# Patient Record
Sex: Male | Born: 1950 | Race: White | Hispanic: No | Marital: Single | Smoking: Former smoker
Health system: Southern US, Community
[De-identification: ages and names within clinical notes are randomized; demographics above are authoritative.]

## PROBLEM LIST (undated history)

## (undated) DIAGNOSIS — E785 Hyperlipidemia, unspecified: Secondary | ICD-10-CM

## (undated) DIAGNOSIS — D490 Neoplasm of unspecified behavior of digestive system: Secondary | ICD-10-CM

## (undated) DIAGNOSIS — I8500 Esophageal varices without bleeding: Secondary | ICD-10-CM

## (undated) DIAGNOSIS — K7460 Unspecified cirrhosis of liver: Secondary | ICD-10-CM

## (undated) DIAGNOSIS — K3189 Other diseases of stomach and duodenum: Secondary | ICD-10-CM

## (undated) DIAGNOSIS — K766 Portal hypertension: Secondary | ICD-10-CM

## (undated) DIAGNOSIS — E1121 Type 2 diabetes mellitus with diabetic nephropathy: Secondary | ICD-10-CM

## (undated) DIAGNOSIS — E119 Type 2 diabetes mellitus without complications: Secondary | ICD-10-CM

## (undated) DIAGNOSIS — K31811 Angiodysplasia of stomach and duodenum with bleeding: Secondary | ICD-10-CM

## (undated) DIAGNOSIS — D649 Anemia, unspecified: Secondary | ICD-10-CM

## (undated) DIAGNOSIS — I499 Cardiac arrhythmia, unspecified: Secondary | ICD-10-CM

## (undated) DIAGNOSIS — Z8719 Personal history of other diseases of the digestive system: Secondary | ICD-10-CM

## (undated) DIAGNOSIS — K7682 Hepatic encephalopathy: Secondary | ICD-10-CM

## (undated) DIAGNOSIS — K2270 Barrett's esophagus without dysplasia: Secondary | ICD-10-CM

## (undated) DIAGNOSIS — F32A Depression, unspecified: Secondary | ICD-10-CM

## (undated) DIAGNOSIS — K7290 Hepatic failure, unspecified without coma: Secondary | ICD-10-CM

## (undated) DIAGNOSIS — F329 Major depressive disorder, single episode, unspecified: Secondary | ICD-10-CM

## (undated) DIAGNOSIS — I4891 Unspecified atrial fibrillation: Secondary | ICD-10-CM

## (undated) DIAGNOSIS — N189 Chronic kidney disease, unspecified: Secondary | ICD-10-CM

## (undated) DIAGNOSIS — G4730 Sleep apnea, unspecified: Secondary | ICD-10-CM

## (undated) DIAGNOSIS — K219 Gastro-esophageal reflux disease without esophagitis: Secondary | ICD-10-CM

## (undated) DIAGNOSIS — M1990 Unspecified osteoarthritis, unspecified site: Secondary | ICD-10-CM

## (undated) DIAGNOSIS — I509 Heart failure, unspecified: Secondary | ICD-10-CM

## (undated) DIAGNOSIS — I872 Venous insufficiency (chronic) (peripheral): Secondary | ICD-10-CM

## (undated) DIAGNOSIS — D61818 Other pancytopenia: Secondary | ICD-10-CM

## (undated) DIAGNOSIS — I10 Essential (primary) hypertension: Secondary | ICD-10-CM

## (undated) DIAGNOSIS — J449 Chronic obstructive pulmonary disease, unspecified: Secondary | ICD-10-CM

## (undated) DIAGNOSIS — I878 Other specified disorders of veins: Secondary | ICD-10-CM

## (undated) DIAGNOSIS — F419 Anxiety disorder, unspecified: Secondary | ICD-10-CM

## (undated) DIAGNOSIS — K317 Polyp of stomach and duodenum: Secondary | ICD-10-CM

## (undated) DIAGNOSIS — K31819 Angiodysplasia of stomach and duodenum without bleeding: Secondary | ICD-10-CM

## (undated) DIAGNOSIS — R609 Edema, unspecified: Secondary | ICD-10-CM

## (undated) DIAGNOSIS — E669 Obesity, unspecified: Secondary | ICD-10-CM

## (undated) HISTORY — PX: UVULOPALATOPHARYNGOPLASTY: SHX827

## (undated) HISTORY — PX: TONSILLECTOMY AND ADENOIDECTOMY: SHX28

## (undated) HISTORY — DX: Heart failure, unspecified: I50.9

## (undated) HISTORY — PX: TONSILLECTOMY: SUR1361

## (undated) HISTORY — PX: ULNAR NERVE TRANSPOSITION: SHX2595

---
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
## 2004-10-13 ENCOUNTER — Ambulatory Visit: Payer: Self-pay | Admitting: Gastroenterology

## 2004-10-25 ENCOUNTER — Ambulatory Visit: Payer: Self-pay | Admitting: Gastroenterology

## 2006-02-21 ENCOUNTER — Ambulatory Visit: Payer: Self-pay | Admitting: Internal Medicine

## 2006-03-01 ENCOUNTER — Ambulatory Visit: Payer: Self-pay | Admitting: Internal Medicine

## 2006-03-31 ENCOUNTER — Ambulatory Visit: Payer: Self-pay | Admitting: Internal Medicine

## 2009-02-08 ENCOUNTER — Ambulatory Visit: Payer: Self-pay | Admitting: Internal Medicine

## 2009-02-22 ENCOUNTER — Ambulatory Visit: Payer: Self-pay | Admitting: Urology

## 2010-05-29 ENCOUNTER — Inpatient Hospital Stay: Payer: Self-pay | Admitting: Family Medicine

## 2011-11-06 ENCOUNTER — Ambulatory Visit: Payer: Self-pay | Admitting: Unknown Physician Specialty

## 2013-09-27 ENCOUNTER — Emergency Department: Payer: Self-pay | Admitting: Emergency Medicine

## 2013-09-27 LAB — CBC
HCT: 25.7 % — ABNORMAL LOW (ref 40.0–52.0)
HGB: 8.2 g/dL — ABNORMAL LOW (ref 13.0–18.0)
MCHC: 31.9 g/dL — ABNORMAL LOW (ref 32.0–36.0)
MCV: 79 fL — ABNORMAL LOW (ref 80–100)
Platelet: 73 10*3/uL — ABNORMAL LOW (ref 150–440)
RBC: 3.26 10*6/uL — ABNORMAL LOW (ref 4.40–5.90)
RDW: 16 % — ABNORMAL HIGH (ref 11.5–14.5)
WBC: 5.2 10*3/uL (ref 3.8–10.6)

## 2013-09-27 LAB — BASIC METABOLIC PANEL
Anion Gap: 5 — ABNORMAL LOW (ref 7–16)
Calcium, Total: 8.5 mg/dL (ref 8.5–10.1)
Chloride: 108 mmol/L — ABNORMAL HIGH (ref 98–107)
Creatinine: 1.79 mg/dL — ABNORMAL HIGH (ref 0.60–1.30)
EGFR (African American): 46 — ABNORMAL LOW
EGFR (Non-African Amer.): 40 — ABNORMAL LOW
Glucose: 133 mg/dL — ABNORMAL HIGH (ref 65–99)
Osmolality: 281 (ref 275–301)
Sodium: 139 mmol/L (ref 136–145)

## 2013-11-04 DIAGNOSIS — R161 Splenomegaly, not elsewhere classified: Secondary | ICD-10-CM | POA: Insufficient documentation

## 2013-11-05 ENCOUNTER — Observation Stay: Payer: Self-pay | Admitting: Internal Medicine

## 2013-11-05 LAB — BASIC METABOLIC PANEL
Anion Gap: 6 — ABNORMAL LOW (ref 7–16)
BUN: 21 mg/dL — ABNORMAL HIGH (ref 7–18)
CHLORIDE: 106 mmol/L (ref 98–107)
Calcium, Total: 9.4 mg/dL (ref 8.5–10.1)
Co2: 27 mmol/L (ref 21–32)
Creatinine: 2.04 mg/dL — ABNORMAL HIGH (ref 0.60–1.30)
GFR CALC AF AMER: 40 — AB
GFR CALC NON AF AMER: 34 — AB
Glucose: 143 mg/dL — ABNORMAL HIGH (ref 65–99)
Osmolality: 283 (ref 275–301)
Potassium: 3.9 mmol/L (ref 3.5–5.1)
Sodium: 139 mmol/L (ref 136–145)

## 2013-11-05 LAB — URINALYSIS, COMPLETE
BILIRUBIN, UR: NEGATIVE
Blood: NEGATIVE
Ketone: NEGATIVE
NITRITE: NEGATIVE
Ph: 6 (ref 4.5–8.0)
RBC,UR: 1 /HPF (ref 0–5)
Specific Gravity: 1.013 (ref 1.003–1.030)
Squamous Epithelial: 2
WBC UR: 9 /HPF (ref 0–5)

## 2013-11-05 LAB — CBC WITH DIFFERENTIAL/PLATELET
Basophil #: 0.1 10*3/uL (ref 0.0–0.1)
Basophil %: 1.1 %
Eosinophil #: 0.2 10*3/uL (ref 0.0–0.7)
Eosinophil %: 3.8 %
HCT: 26.7 % — AB (ref 40.0–52.0)
HGB: 8.5 g/dL — ABNORMAL LOW (ref 13.0–18.0)
LYMPHS PCT: 18.2 %
Lymphocyte #: 0.9 10*3/uL — ABNORMAL LOW (ref 1.0–3.6)
MCH: 24.6 pg — ABNORMAL LOW (ref 26.0–34.0)
MCHC: 31.9 g/dL — ABNORMAL LOW (ref 32.0–36.0)
MCV: 77 fL — ABNORMAL LOW (ref 80–100)
MONOS PCT: 9.4 %
Monocyte #: 0.5 x10 3/mm (ref 0.2–1.0)
NEUTROS PCT: 67.5 %
Neutrophil #: 3.4 10*3/uL (ref 1.4–6.5)
PLATELETS: 86 10*3/uL — AB (ref 150–440)
RBC: 3.46 10*6/uL — ABNORMAL LOW (ref 4.40–5.90)
RDW: 15.5 % — AB (ref 11.5–14.5)
WBC: 5.1 10*3/uL (ref 3.8–10.6)

## 2013-11-05 LAB — TROPONIN I: Troponin-I: <0.02 ng/mL

## 2013-11-06 LAB — CBC WITH DIFFERENTIAL/PLATELET
BASOS ABS: 0 10*3/uL (ref 0.0–0.1)
Basophil %: 0.8 %
EOS PCT: 4 %
Eosinophil #: 0.2 10*3/uL (ref 0.0–0.7)
HCT: 24 % — ABNORMAL LOW (ref 40.0–52.0)
HGB: 8 g/dL — ABNORMAL LOW (ref 13.0–18.0)
LYMPHS PCT: 22.3 %
Lymphocyte #: 1.1 10*3/uL (ref 1.0–3.6)
MCH: 25.8 pg — AB (ref 26.0–34.0)
MCHC: 33.2 g/dL (ref 32.0–36.0)
MCV: 78 fL — ABNORMAL LOW (ref 80–100)
MONOS PCT: 10.2 %
Monocyte #: 0.5 x10 3/mm (ref 0.2–1.0)
Neutrophil #: 3.2 10*3/uL (ref 1.4–6.5)
Neutrophil %: 62.7 %
Platelet: 83 10*3/uL — ABNORMAL LOW (ref 150–440)
RBC: 3.08 10*6/uL — ABNORMAL LOW (ref 4.40–5.90)
RDW: 16.1 % — AB (ref 11.5–14.5)
WBC: 5 10*3/uL (ref 3.8–10.6)

## 2013-11-06 LAB — BASIC METABOLIC PANEL
Anion Gap: 2 — ABNORMAL LOW (ref 7–16)
BUN: 21 mg/dL — AB (ref 7–18)
Calcium, Total: 9.1 mg/dL (ref 8.5–10.1)
Chloride: 109 mmol/L — ABNORMAL HIGH (ref 98–107)
Co2: 28 mmol/L (ref 21–32)
Creatinine: 1.89 mg/dL — ABNORMAL HIGH (ref 0.60–1.30)
EGFR (Non-African Amer.): 37 — ABNORMAL LOW
GFR CALC AF AMER: 43 — AB
Glucose: 78 mg/dL (ref 65–99)
Osmolality: 279 (ref 275–301)
Potassium: 3.5 mmol/L (ref 3.5–5.1)
SODIUM: 139 mmol/L (ref 136–145)

## 2013-11-06 LAB — HEPATIC FUNCTION PANEL A (ARMC)
ALK PHOS: 66 U/L
Albumin: 2.9 g/dL — ABNORMAL LOW (ref 3.4–5.0)
Bilirubin, Direct: 0.2 mg/dL (ref 0.00–0.20)
Bilirubin,Total: 0.6 mg/dL (ref 0.2–1.0)
SGOT(AST): 22 U/L (ref 15–37)
SGPT (ALT): 15 U/L (ref 12–78)
Total Protein: 6.8 g/dL (ref 6.4–8.2)

## 2013-11-06 LAB — AMMONIA: Ammonia, Plasma: 75 mcmol/L — ABNORMAL HIGH (ref 11–32)

## 2013-11-06 LAB — PROTIME-INR
INR: 1.3
Prothrombin Time: 15.7 secs — ABNORMAL HIGH (ref 11.5–14.7)

## 2013-11-07 LAB — CBC WITH DIFFERENTIAL/PLATELET
BASOS PCT: 1 %
Basophil #: 0 10*3/uL (ref 0.0–0.1)
EOS ABS: 0.2 10*3/uL (ref 0.0–0.7)
EOS PCT: 4.4 %
HCT: 24 % — ABNORMAL LOW (ref 40.0–52.0)
HGB: 7.8 g/dL — ABNORMAL LOW (ref 13.0–18.0)
LYMPHS PCT: 27.3 %
Lymphocyte #: 1.2 10*3/uL (ref 1.0–3.6)
MCH: 25.7 pg — ABNORMAL LOW (ref 26.0–34.0)
MCHC: 32.7 g/dL (ref 32.0–36.0)
MCV: 78 fL — AB (ref 80–100)
Monocyte #: 0.4 x10 3/mm (ref 0.2–1.0)
Monocyte %: 8.7 %
NEUTROS ABS: 2.6 10*3/uL (ref 1.4–6.5)
Neutrophil %: 58.6 %
Platelet: 77 10*3/uL — ABNORMAL LOW (ref 150–440)
RBC: 3.05 10*6/uL — AB (ref 4.40–5.90)
RDW: 16.4 % — ABNORMAL HIGH (ref 11.5–14.5)
WBC: 4.5 10*3/uL (ref 3.8–10.6)

## 2013-11-07 LAB — COMPREHENSIVE METABOLIC PANEL
ALBUMIN: 2.8 g/dL — AB (ref 3.4–5.0)
ALK PHOS: 62 U/L
AST: 19 U/L (ref 15–37)
Anion Gap: 5 — ABNORMAL LOW (ref 7–16)
BILIRUBIN TOTAL: 0.7 mg/dL (ref 0.2–1.0)
BUN: 20 mg/dL — ABNORMAL HIGH (ref 7–18)
CREATININE: 1.84 mg/dL — AB (ref 0.60–1.30)
Calcium, Total: 9.3 mg/dL (ref 8.5–10.1)
Chloride: 111 mmol/L — ABNORMAL HIGH (ref 98–107)
Co2: 24 mmol/L (ref 21–32)
GFR CALC AF AMER: 45 — AB
GFR CALC NON AF AMER: 39 — AB
Glucose: 121 mg/dL — ABNORMAL HIGH (ref 65–99)
Osmolality: 283 (ref 275–301)
POTASSIUM: 4.1 mmol/L (ref 3.5–5.1)
SGPT (ALT): 14 U/L (ref 12–78)
Sodium: 140 mmol/L (ref 136–145)
Total Protein: 6.5 g/dL (ref 6.4–8.2)

## 2013-11-07 LAB — AMMONIA: Ammonia, Plasma: 64 mcmol/L — ABNORMAL HIGH (ref 11–32)

## 2013-12-04 DIAGNOSIS — D696 Thrombocytopenia, unspecified: Secondary | ICD-10-CM | POA: Insufficient documentation

## 2013-12-04 DIAGNOSIS — D509 Iron deficiency anemia, unspecified: Secondary | ICD-10-CM | POA: Insufficient documentation

## 2014-01-06 ENCOUNTER — Encounter: Payer: Self-pay | Admitting: Surgery

## 2014-01-11 ENCOUNTER — Encounter: Payer: Self-pay | Admitting: Surgery

## 2014-01-12 ENCOUNTER — Ambulatory Visit: Payer: Self-pay | Admitting: Internal Medicine

## 2014-01-12 LAB — CBC CANCER CENTER
BASOS ABS: 2 %
Bands: 1 %
Eosinophil: 4 %
HCT: 25.6 % — ABNORMAL LOW (ref 40.0–52.0)
HGB: 7.7 g/dL — ABNORMAL LOW (ref 13.0–18.0)
Lymphocytes: 22 %
MCH: 23.3 pg — ABNORMAL LOW (ref 26.0–34.0)
MCHC: 30 g/dL — ABNORMAL LOW (ref 32.0–36.0)
MCV: 78 fL — AB (ref 80–100)
Monocytes: 4 %
Platelet: 97 x10 3/mm — ABNORMAL LOW (ref 150–440)
RBC: 3.3 10*6/uL — ABNORMAL LOW (ref 4.40–5.90)
RDW: 17.2 % — AB (ref 11.5–14.5)
Segmented Neutrophils: 67 %
WBC: 5.7 x10 3/mm (ref 3.8–10.6)

## 2014-01-12 LAB — IRON AND TIBC
Iron Bind.Cap.(Total): 431 ug/dL (ref 250–450)
Iron Saturation: 9 %
Iron: 37 ug/dL — ABNORMAL LOW (ref 65–175)
Unbound Iron-Bind.Cap.: 394 ug/dL

## 2014-01-12 LAB — FERRITIN: Ferritin (ARMC): 8 ng/mL (ref 8–388)

## 2014-01-12 LAB — RETICULOCYTES
ABSOLUTE RETIC COUNT: 0.0545 10*6/uL (ref 0.019–0.186)
RETICULOCYTE: 1.65 % (ref 0.4–3.1)

## 2014-01-12 LAB — LACTATE DEHYDROGENASE: LDH: 193 U/L (ref 85–241)

## 2014-01-12 LAB — FOLATE: Folic Acid: 13.7 ng/mL (ref 3.1–100.0)

## 2014-01-13 LAB — PROT IMMUNOELECTROPHORES(ARMC)

## 2014-01-22 LAB — OCCULT BLOOD X 1 CARD TO LAB, STOOL
OCCULT BLOOD, FECES: POSITIVE
Occult Blood, Feces: POSITIVE

## 2014-01-29 ENCOUNTER — Ambulatory Visit: Payer: Self-pay | Admitting: Internal Medicine

## 2014-01-29 ENCOUNTER — Encounter: Payer: Self-pay | Admitting: Surgery

## 2014-03-30 ENCOUNTER — Ambulatory Visit: Payer: Self-pay | Admitting: Internal Medicine

## 2014-03-30 LAB — CBC CANCER CENTER
BASOS ABS: 0 x10 3/mm (ref 0.0–0.1)
Basophil %: 0.8 %
EOS ABS: 0.3 x10 3/mm (ref 0.0–0.7)
Eosinophil %: 6.4 %
HCT: 34.5 % — ABNORMAL LOW (ref 40.0–52.0)
HGB: 11.9 g/dL — ABNORMAL LOW (ref 13.0–18.0)
LYMPHS ABS: 1.4 x10 3/mm (ref 1.0–3.6)
LYMPHS PCT: 26.5 %
MCH: 30 pg (ref 26.0–34.0)
MCHC: 34.4 g/dL (ref 32.0–36.0)
MCV: 87 fL (ref 80–100)
MONOS PCT: 6.1 %
Monocyte #: 0.3 x10 3/mm (ref 0.2–1.0)
Neutrophil #: 3.1 x10 3/mm (ref 1.4–6.5)
Neutrophil %: 60.2 %
Platelet: 75 x10 3/mm — ABNORMAL LOW (ref 150–440)
RBC: 3.96 10*6/uL — ABNORMAL LOW (ref 4.40–5.90)
RDW: 18.8 % — ABNORMAL HIGH (ref 11.5–14.5)
WBC: 5.2 x10 3/mm (ref 3.8–10.6)

## 2014-03-30 LAB — IRON AND TIBC
IRON SATURATION: 55 %
Iron Bind.Cap.(Total): 332 ug/dL (ref 250–450)
Iron: 182 ug/dL — ABNORMAL HIGH (ref 65–175)
Unbound Iron-Bind.Cap.: 150 ug/dL

## 2014-03-30 LAB — FERRITIN: Ferritin (ARMC): 66 ng/mL (ref 8–388)

## 2014-03-31 ENCOUNTER — Ambulatory Visit: Payer: Self-pay | Admitting: Internal Medicine

## 2014-05-09 ENCOUNTER — Inpatient Hospital Stay: Payer: Self-pay | Admitting: Internal Medicine

## 2014-05-09 LAB — URINALYSIS, COMPLETE
Bilirubin,UR: NEGATIVE
Blood: NEGATIVE
Glucose,UR: >=500 mg/dL (ref 0–75)
Ketone: NEGATIVE
Leukocyte Esterase: NEGATIVE
NITRITE: NEGATIVE
Ph: 5 (ref 4.5–8.0)
Protein: NEGATIVE
SPECIFIC GRAVITY: 1.017 (ref 1.003–1.030)
Squamous Epithelial: 3
WBC UR: 3 /HPF (ref 0–5)

## 2014-05-09 LAB — CBC
HCT: 35.8 % — ABNORMAL LOW (ref 40.0–52.0)
HGB: 12.4 g/dL — ABNORMAL LOW (ref 13.0–18.0)
MCH: 32 pg (ref 26.0–34.0)
MCHC: 34.7 g/dL (ref 32.0–36.0)
MCV: 92 fL (ref 80–100)
PLATELETS: 80 10*3/uL — AB (ref 150–440)
RBC: 3.88 10*6/uL — AB (ref 4.40–5.90)
RDW: 14.9 % — ABNORMAL HIGH (ref 11.5–14.5)
WBC: 4.2 10*3/uL (ref 3.8–10.6)

## 2014-05-09 LAB — COMPREHENSIVE METABOLIC PANEL
ANION GAP: 9 (ref 7–16)
Albumin: 3.1 g/dL — ABNORMAL LOW (ref 3.4–5.0)
Alkaline Phosphatase: 59 U/L
BILIRUBIN TOTAL: 1.7 mg/dL — AB (ref 0.2–1.0)
BUN: 20 mg/dL — AB (ref 7–18)
CREATININE: 2.6 mg/dL — AB (ref 0.60–1.30)
Calcium, Total: 9.2 mg/dL (ref 8.5–10.1)
Chloride: 103 mmol/L (ref 98–107)
Co2: 26 mmol/L (ref 21–32)
EGFR (African American): 29 — ABNORMAL LOW
EGFR (Non-African Amer.): 25 — ABNORMAL LOW
GLUCOSE: 259 mg/dL — AB (ref 65–99)
OSMOLALITY: 287 (ref 275–301)
Potassium: 3.6 mmol/L (ref 3.5–5.1)
SGOT(AST): 21 U/L (ref 15–37)
SGPT (ALT): 26 U/L
SODIUM: 138 mmol/L (ref 136–145)
TOTAL PROTEIN: 6.8 g/dL (ref 6.4–8.2)

## 2014-05-09 LAB — PRO B NATRIURETIC PEPTIDE: B-TYPE NATIURETIC PEPTID: 158 pg/mL — AB (ref 0–125)

## 2014-05-09 LAB — LIPASE, BLOOD: Lipase: 113 U/L (ref 73–393)

## 2014-05-09 LAB — PROTIME-INR
INR: 1.2
Prothrombin Time: 15.3 secs — ABNORMAL HIGH (ref 11.5–14.7)

## 2014-05-09 LAB — AMMONIA: AMMONIA, PLASMA: 66 umol/L — AB (ref 11–32)

## 2014-05-09 LAB — TROPONIN I: Troponin-I: <0.02 ng/mL

## 2014-05-10 LAB — BASIC METABOLIC PANEL
Anion Gap: 11 (ref 7–16)
BUN: 26 mg/dL — ABNORMAL HIGH (ref 7–18)
CALCIUM: 8.4 mg/dL — AB (ref 8.5–10.1)
CREATININE: 2.5 mg/dL — AB (ref 0.60–1.30)
Chloride: 103 mmol/L (ref 98–107)
Co2: 24 mmol/L (ref 21–32)
GFR CALC AF AMER: 31 — AB
GFR CALC NON AF AMER: 27 — AB
Glucose: 264 mg/dL — ABNORMAL HIGH (ref 65–99)
Osmolality: 290 (ref 275–301)
Potassium: 3.5 mmol/L (ref 3.5–5.1)
SODIUM: 138 mmol/L (ref 136–145)

## 2014-05-10 LAB — CBC WITH DIFFERENTIAL/PLATELET
BASOS ABS: 0 10*3/uL (ref 0.0–0.1)
Basophil %: 0.3 %
Eosinophil #: 0.1 10*3/uL (ref 0.0–0.7)
Eosinophil %: 0.6 %
HCT: 30.2 % — ABNORMAL LOW (ref 40.0–52.0)
HGB: 10.9 g/dL — ABNORMAL LOW (ref 13.0–18.0)
LYMPHS PCT: 10.9 %
Lymphocyte #: 0.9 10*3/uL — ABNORMAL LOW (ref 1.0–3.6)
MCH: 32.8 pg (ref 26.0–34.0)
MCHC: 36.2 g/dL — AB (ref 32.0–36.0)
MCV: 91 fL (ref 80–100)
Monocyte #: 0.5 x10 3/mm (ref 0.2–1.0)
Monocyte %: 5.9 %
Neutrophil #: 7 10*3/uL — ABNORMAL HIGH (ref 1.4–6.5)
Neutrophil %: 82.3 %
Platelet: 59 10*3/uL — ABNORMAL LOW (ref 150–440)
RBC: 3.33 10*6/uL — ABNORMAL LOW (ref 4.40–5.90)
RDW: 14.8 % — AB (ref 11.5–14.5)
WBC: 8.5 10*3/uL (ref 3.8–10.6)

## 2014-05-10 LAB — AMMONIA: AMMONIA, PLASMA: 89 umol/L — AB (ref 11–32)

## 2014-05-10 LAB — TSH: Thyroid Stimulating Horm: 0.44 u[IU]/mL — ABNORMAL LOW

## 2014-05-10 LAB — MAGNESIUM: Magnesium: 1.7 mg/dL — ABNORMAL LOW

## 2014-05-10 LAB — HEMOGLOBIN A1C: Hemoglobin A1C: 11 % — ABNORMAL HIGH (ref 4.2–6.3)

## 2014-05-11 LAB — CBC WITH DIFFERENTIAL/PLATELET
Basophil #: 0 10*3/uL (ref 0.0–0.1)
Basophil %: 0.3 %
EOS ABS: 0.2 10*3/uL (ref 0.0–0.7)
EOS PCT: 2.3 %
HCT: 29.5 % — AB (ref 40.0–52.0)
HGB: 10.2 g/dL — AB (ref 13.0–18.0)
Lymphocyte #: 1.1 10*3/uL (ref 1.0–3.6)
Lymphocyte %: 13.2 %
MCH: 32 pg (ref 26.0–34.0)
MCHC: 34.4 g/dL (ref 32.0–36.0)
MCV: 93 fL (ref 80–100)
MONOS PCT: 8 %
Monocyte #: 0.7 x10 3/mm (ref 0.2–1.0)
NEUTROS PCT: 76.2 %
Neutrophil #: 6.5 10*3/uL (ref 1.4–6.5)
PLATELETS: 66 10*3/uL — AB (ref 150–440)
RBC: 3.18 10*6/uL — AB (ref 4.40–5.90)
RDW: 14.8 % — AB (ref 11.5–14.5)
WBC: 8.5 10*3/uL (ref 3.8–10.6)

## 2014-05-11 LAB — COMPREHENSIVE METABOLIC PANEL
ALK PHOS: 43 U/L — AB
AST: 13 U/L — AB (ref 15–37)
Albumin: 2.2 g/dL — ABNORMAL LOW (ref 3.4–5.0)
Anion Gap: 8 (ref 7–16)
BILIRUBIN TOTAL: 1.2 mg/dL — AB (ref 0.2–1.0)
BUN: 28 mg/dL — ABNORMAL HIGH (ref 7–18)
Calcium, Total: 8.1 mg/dL — ABNORMAL LOW (ref 8.5–10.1)
Chloride: 106 mmol/L (ref 98–107)
Co2: 25 mmol/L (ref 21–32)
Creatinine: 2.26 mg/dL — ABNORMAL HIGH (ref 0.60–1.30)
EGFR (African American): 35 — ABNORMAL LOW
GFR CALC NON AF AMER: 30 — AB
Glucose: 205 mg/dL — ABNORMAL HIGH (ref 65–99)
Osmolality: 289 (ref 275–301)
POTASSIUM: 3.7 mmol/L (ref 3.5–5.1)
SGPT (ALT): 14 U/L
SODIUM: 139 mmol/L (ref 136–145)
TOTAL PROTEIN: 5.8 g/dL — AB (ref 6.4–8.2)

## 2014-05-11 LAB — AMMONIA: Ammonia, Plasma: 51 mcmol/L — ABNORMAL HIGH (ref 11–32)

## 2014-05-11 LAB — MAGNESIUM: Magnesium: 2.1 mg/dL

## 2014-05-12 LAB — COMPREHENSIVE METABOLIC PANEL
ALK PHOS: 46 U/L
ALT: 20 U/L
ANION GAP: 9 (ref 7–16)
AST: 29 U/L (ref 15–37)
Albumin: 2 g/dL — ABNORMAL LOW (ref 3.4–5.0)
BILIRUBIN TOTAL: 1.1 mg/dL — AB (ref 0.2–1.0)
BUN: 25 mg/dL — AB (ref 7–18)
CHLORIDE: 108 mmol/L — AB (ref 98–107)
Calcium, Total: 8.2 mg/dL — ABNORMAL LOW (ref 8.5–10.1)
Co2: 23 mmol/L (ref 21–32)
Creatinine: 1.98 mg/dL — ABNORMAL HIGH (ref 0.60–1.30)
EGFR (African American): 41 — ABNORMAL LOW
GFR CALC NON AF AMER: 35 — AB
Glucose: 172 mg/dL — ABNORMAL HIGH (ref 65–99)
Osmolality: 288 (ref 275–301)
Potassium: 3.7 mmol/L (ref 3.5–5.1)
Sodium: 140 mmol/L (ref 136–145)
Total Protein: 5.8 g/dL — ABNORMAL LOW (ref 6.4–8.2)

## 2014-05-12 LAB — CBC WITH DIFFERENTIAL/PLATELET
BASOS ABS: 0 10*3/uL (ref 0.0–0.1)
BASOS PCT: 0.7 %
EOS PCT: 3.8 %
Eosinophil #: 0.3 10*3/uL (ref 0.0–0.7)
HCT: 28.1 % — ABNORMAL LOW (ref 40.0–52.0)
HGB: 9.9 g/dL — ABNORMAL LOW (ref 13.0–18.0)
Lymphocyte #: 1.1 10*3/uL (ref 1.0–3.6)
Lymphocyte %: 14.2 %
MCH: 32.5 pg (ref 26.0–34.0)
MCHC: 35.1 g/dL (ref 32.0–36.0)
MCV: 93 fL (ref 80–100)
MONO ABS: 0.5 x10 3/mm (ref 0.2–1.0)
Monocyte %: 6.8 %
NEUTROS ABS: 5.5 10*3/uL (ref 1.4–6.5)
Neutrophil %: 74.5 %
Platelet: 77 10*3/uL — ABNORMAL LOW (ref 150–440)
RBC: 3.04 10*6/uL — ABNORMAL LOW (ref 4.40–5.90)
RDW: 14.4 % (ref 11.5–14.5)
WBC: 7.4 10*3/uL (ref 3.8–10.6)

## 2014-05-12 LAB — URINE CULTURE

## 2014-05-12 LAB — AMMONIA: Ammonia, Plasma: 60 mcmol/L — ABNORMAL HIGH (ref 11–32)

## 2014-05-13 LAB — CBC WITH DIFFERENTIAL/PLATELET
BASOS PCT: 0.8 %
Basophil #: 0 10*3/uL (ref 0.0–0.1)
Eosinophil #: 0.2 10*3/uL (ref 0.0–0.7)
Eosinophil %: 4.1 %
HCT: 29.1 % — ABNORMAL LOW (ref 40.0–52.0)
HGB: 10 g/dL — ABNORMAL LOW (ref 13.0–18.0)
Lymphocyte #: 1.2 10*3/uL (ref 1.0–3.6)
Lymphocyte %: 19.3 %
MCH: 31.7 pg (ref 26.0–34.0)
MCHC: 34.3 g/dL (ref 32.0–36.0)
MCV: 92 fL (ref 80–100)
MONOS PCT: 9.3 %
Monocyte #: 0.6 x10 3/mm (ref 0.2–1.0)
NEUTROS PCT: 66.5 %
Neutrophil #: 4 10*3/uL (ref 1.4–6.5)
Platelet: 97 10*3/uL — ABNORMAL LOW (ref 150–440)
RBC: 3.15 10*6/uL — ABNORMAL LOW (ref 4.40–5.90)
RDW: 14.3 % (ref 11.5–14.5)
WBC: 6.1 10*3/uL (ref 3.8–10.6)

## 2014-05-13 LAB — COMPREHENSIVE METABOLIC PANEL WITH GFR
Albumin: 2 g/dL — ABNORMAL LOW
Alkaline Phosphatase: 49 U/L
Anion Gap: 9
BUN: 23 mg/dL — ABNORMAL HIGH
Bilirubin,Total: 0.8 mg/dL
Calcium, Total: 8.3 mg/dL — ABNORMAL LOW
Chloride: 107 mmol/L
Co2: 24 mmol/L
Creatinine: 1.98 mg/dL — ABNORMAL HIGH
EGFR (African American): 41 — ABNORMAL LOW
EGFR (Non-African Amer.): 35 — ABNORMAL LOW
Glucose: 135 mg/dL — ABNORMAL HIGH
Osmolality: 285
Potassium: 3.6 mmol/L
SGOT(AST): 37 U/L
SGPT (ALT): 26 U/L
Sodium: 140 mmol/L
Total Protein: 5.8 g/dL — ABNORMAL LOW

## 2014-05-13 LAB — AMMONIA: Ammonia, Plasma: 65 umol/L — ABNORMAL HIGH

## 2014-05-14 LAB — CBC WITH DIFFERENTIAL/PLATELET
BASOS ABS: 0.1 10*3/uL (ref 0.0–0.1)
Basophil %: 0.9 %
EOS ABS: 0.2 10*3/uL (ref 0.0–0.7)
Eosinophil %: 4 %
HCT: 29.2 % — ABNORMAL LOW (ref 40.0–52.0)
HGB: 10.1 g/dL — ABNORMAL LOW (ref 13.0–18.0)
LYMPHS ABS: 1.3 10*3/uL (ref 1.0–3.6)
Lymphocyte %: 20.8 %
MCH: 32.1 pg (ref 26.0–34.0)
MCHC: 34.6 g/dL (ref 32.0–36.0)
MCV: 93 fL (ref 80–100)
MONO ABS: 0.6 x10 3/mm (ref 0.2–1.0)
Monocyte %: 10.6 %
NEUTROS ABS: 3.8 10*3/uL (ref 1.4–6.5)
NEUTROS PCT: 63.7 %
PLATELETS: 95 10*3/uL — AB (ref 150–440)
RBC: 3.15 10*6/uL — AB (ref 4.40–5.90)
RDW: 14.3 % (ref 11.5–14.5)
WBC: 6 10*3/uL (ref 3.8–10.6)

## 2014-05-14 LAB — AMMONIA: Ammonia, Plasma: 70 mcmol/L — ABNORMAL HIGH (ref 11–32)

## 2014-05-14 LAB — COMPREHENSIVE METABOLIC PANEL
ALT: 28 U/L
ANION GAP: 5 — AB (ref 7–16)
Albumin: 2.1 g/dL — ABNORMAL LOW (ref 3.4–5.0)
Alkaline Phosphatase: 49 U/L
BILIRUBIN TOTAL: 0.7 mg/dL (ref 0.2–1.0)
BUN: 22 mg/dL — AB (ref 7–18)
CHLORIDE: 108 mmol/L — AB (ref 98–107)
CO2: 25 mmol/L (ref 21–32)
Calcium, Total: 8 mg/dL — ABNORMAL LOW (ref 8.5–10.1)
Creatinine: 1.87 mg/dL — ABNORMAL HIGH (ref 0.60–1.30)
EGFR (African American): 44 — ABNORMAL LOW
EGFR (Non-African Amer.): 38 — ABNORMAL LOW
GLUCOSE: 124 mg/dL — AB (ref 65–99)
Osmolality: 280 (ref 275–301)
Potassium: 3.6 mmol/L (ref 3.5–5.1)
SGOT(AST): 29 U/L (ref 15–37)
SODIUM: 138 mmol/L (ref 136–145)
Total Protein: 5.7 g/dL — ABNORMAL LOW (ref 6.4–8.2)

## 2014-05-14 LAB — CULTURE, BLOOD (SINGLE)

## 2014-05-25 DIAGNOSIS — K2270 Barrett's esophagus without dysplasia: Secondary | ICD-10-CM | POA: Insufficient documentation

## 2014-05-25 DIAGNOSIS — I10 Essential (primary) hypertension: Secondary | ICD-10-CM | POA: Insufficient documentation

## 2014-05-25 DIAGNOSIS — D61818 Other pancytopenia: Secondary | ICD-10-CM | POA: Insufficient documentation

## 2014-05-25 DIAGNOSIS — E669 Obesity, unspecified: Secondary | ICD-10-CM | POA: Insufficient documentation

## 2014-05-25 DIAGNOSIS — G4730 Sleep apnea, unspecified: Secondary | ICD-10-CM | POA: Insufficient documentation

## 2014-05-25 DIAGNOSIS — Z8719 Personal history of other diseases of the digestive system: Secondary | ICD-10-CM | POA: Insufficient documentation

## 2014-05-25 DIAGNOSIS — R609 Edema, unspecified: Secondary | ICD-10-CM | POA: Insufficient documentation

## 2014-05-25 DIAGNOSIS — E66813 Obesity, class 3: Secondary | ICD-10-CM | POA: Insufficient documentation

## 2014-06-22 ENCOUNTER — Emergency Department: Payer: Self-pay | Admitting: Emergency Medicine

## 2014-06-22 LAB — COMPREHENSIVE METABOLIC PANEL
ALBUMIN: 3 g/dL — AB (ref 3.4–5.0)
ANION GAP: 6 — AB (ref 7–16)
AST: 33 U/L (ref 15–37)
Alkaline Phosphatase: 68 U/L
BUN: 16 mg/dL (ref 7–18)
Bilirubin,Total: 0.7 mg/dL (ref 0.2–1.0)
CALCIUM: 8.7 mg/dL (ref 8.5–10.1)
CHLORIDE: 106 mmol/L (ref 98–107)
CO2: 27 mmol/L (ref 21–32)
Creatinine: 2 mg/dL — ABNORMAL HIGH (ref 0.60–1.30)
EGFR (African American): 40 — ABNORMAL LOW
EGFR (Non-African Amer.): 35 — ABNORMAL LOW
Glucose: 231 mg/dL — ABNORMAL HIGH (ref 65–99)
Osmolality: 286 (ref 275–301)
Potassium: 3.8 mmol/L (ref 3.5–5.1)
SGPT (ALT): 21 U/L
Sodium: 139 mmol/L (ref 136–145)
Total Protein: 6.8 g/dL (ref 6.4–8.2)

## 2014-06-22 LAB — CBC
HCT: 32.6 % — AB (ref 40.0–52.0)
HGB: 11.3 g/dL — AB (ref 13.0–18.0)
MCH: 32.4 pg (ref 26.0–34.0)
MCHC: 34.5 g/dL (ref 32.0–36.0)
MCV: 94 fL (ref 80–100)
Platelet: 94 10*3/uL — ABNORMAL LOW (ref 150–440)
RBC: 3.48 10*6/uL — ABNORMAL LOW (ref 4.40–5.90)
RDW: 14 % (ref 11.5–14.5)
WBC: 6.7 10*3/uL (ref 3.8–10.6)

## 2014-06-22 LAB — TROPONIN I: Troponin-I: <0.02 ng/mL

## 2014-06-22 LAB — AMMONIA: Ammonia, Plasma: 66 mcmol/L — ABNORMAL HIGH (ref 11–32)

## 2014-07-08 ENCOUNTER — Ambulatory Visit: Payer: Self-pay | Admitting: Internal Medicine

## 2014-07-08 LAB — CBC CANCER CENTER
Basophil #: 0.1 x10 3/mm (ref 0.0–0.1)
Basophil %: 1.3 %
EOS ABS: 0.2 x10 3/mm (ref 0.0–0.7)
Eosinophil %: 4.2 %
HCT: 29.5 % — AB (ref 40.0–52.0)
HGB: 10.1 g/dL — AB (ref 13.0–18.0)
LYMPHS ABS: 1.3 x10 3/mm (ref 1.0–3.6)
LYMPHS PCT: 22.7 %
MCH: 31.4 pg (ref 26.0–34.0)
MCHC: 34.1 g/dL (ref 32.0–36.0)
MCV: 92 fL (ref 80–100)
MONOS PCT: 6 %
Monocyte #: 0.4 x10 3/mm (ref 0.2–1.0)
Neutrophil #: 3.9 x10 3/mm (ref 1.4–6.5)
Neutrophil %: 65.8 %
Platelet: 136 x10 3/mm — ABNORMAL LOW (ref 150–440)
RBC: 3.2 10*6/uL — ABNORMAL LOW (ref 4.40–5.90)
RDW: 13.4 % (ref 11.5–14.5)
WBC: 5.9 x10 3/mm (ref 3.8–10.6)

## 2014-07-08 LAB — IRON AND TIBC
IRON SATURATION: 36 %
IRON: 117 ug/dL (ref 65–175)
Iron Bind.Cap.(Total): 324 ug/dL (ref 250–450)
Unbound Iron-Bind.Cap.: 207 ug/dL

## 2014-07-08 LAB — FERRITIN: Ferritin (ARMC): 67 ng/mL (ref 8–388)

## 2014-07-19 LAB — CREATININE, SERUM
Creatinine: 2.25 mg/dL — ABNORMAL HIGH (ref 0.60–1.30)
EGFR (Non-African Amer.): 32 — ABNORMAL LOW
GFR CALC AF AMER: 38 — AB

## 2014-07-20 LAB — KAPPA/LAMBDA FREE LIGHT CHAINS (ARMC)

## 2014-07-23 ENCOUNTER — Emergency Department: Payer: Self-pay | Admitting: Emergency Medicine

## 2014-07-23 LAB — COMPREHENSIVE METABOLIC PANEL
ALK PHOS: 65 U/L
Albumin: 3.3 g/dL — ABNORMAL LOW (ref 3.4–5.0)
Anion Gap: 8 (ref 7–16)
BILIRUBIN TOTAL: 0.8 mg/dL (ref 0.2–1.0)
BUN: 17 mg/dL (ref 7–18)
Calcium, Total: 9.3 mg/dL (ref 8.5–10.1)
Chloride: 104 mmol/L (ref 98–107)
Co2: 24 mmol/L (ref 21–32)
Creatinine: 2.16 mg/dL — ABNORMAL HIGH (ref 0.60–1.30)
Glucose: 263 mg/dL — ABNORMAL HIGH (ref 65–99)
Osmolality: 283 (ref 275–301)
POTASSIUM: 4.2 mmol/L (ref 3.5–5.1)
SGOT(AST): 32 U/L (ref 15–37)
SGPT (ALT): 34 U/L
Sodium: 136 mmol/L (ref 136–145)
TOTAL PROTEIN: 7.2 g/dL (ref 6.4–8.2)

## 2014-07-23 LAB — URINALYSIS, COMPLETE
BILIRUBIN, UR: NEGATIVE
Bacteria: NONE SEEN
Blood: NEGATIVE
Hyaline Cast: 2
Ketone: NEGATIVE
Leukocyte Esterase: NEGATIVE
Nitrite: NEGATIVE
PH: 5 (ref 4.5–8.0)
Protein: NEGATIVE
RBC,UR: <1 /HPF (ref 0–5)
Specific Gravity: 1.016 (ref 1.003–1.030)
Squamous Epithelial: 1

## 2014-07-23 LAB — AMMONIA: Ammonia, Plasma: 78 mcmol/L — ABNORMAL HIGH (ref 11–32)

## 2014-07-23 LAB — CBC
HCT: 32.1 % — ABNORMAL LOW (ref 40.0–52.0)
HGB: 11.2 g/dL — AB (ref 13.0–18.0)
MCH: 31.7 pg (ref 26.0–34.0)
MCHC: 34.8 g/dL (ref 32.0–36.0)
MCV: 91 fL (ref 80–100)
PLATELETS: 105 10*3/uL — AB (ref 150–440)
RBC: 3.52 10*6/uL — ABNORMAL LOW (ref 4.40–5.90)
RDW: 14 % (ref 11.5–14.5)
WBC: 6.3 10*3/uL (ref 3.8–10.6)

## 2014-07-23 LAB — ETHANOL: ETHANOL LVL: 4 mg/dL (ref 0–80)

## 2014-08-01 ENCOUNTER — Ambulatory Visit: Payer: Self-pay | Admitting: Internal Medicine

## 2014-08-13 ENCOUNTER — Inpatient Hospital Stay: Payer: Self-pay | Admitting: Internal Medicine

## 2014-08-13 LAB — COMPREHENSIVE METABOLIC PANEL
ALK PHOS: 59 U/L
Albumin: 3.1 g/dL — ABNORMAL LOW (ref 3.4–5.0)
Anion Gap: 9 (ref 7–16)
BUN: 21 mg/dL — AB (ref 7–18)
Bilirubin,Total: 1.3 mg/dL — ABNORMAL HIGH (ref 0.2–1.0)
CALCIUM: 9.4 mg/dL (ref 8.5–10.1)
CREATININE: 2.22 mg/dL — AB (ref 0.60–1.30)
Chloride: 107 mmol/L (ref 98–107)
Co2: 26 mmol/L (ref 21–32)
EGFR (Non-African Amer.): 32 — ABNORMAL LOW
GFR CALC AF AMER: 39 — AB
Glucose: 104 mg/dL — ABNORMAL HIGH (ref 65–99)
Osmolality: 286 (ref 275–301)
POTASSIUM: 3.9 mmol/L (ref 3.5–5.1)
SGOT(AST): 26 U/L (ref 15–37)
SGPT (ALT): 27 U/L
Sodium: 142 mmol/L (ref 136–145)
TOTAL PROTEIN: 7.1 g/dL (ref 6.4–8.2)

## 2014-08-13 LAB — CBC
HCT: 32.7 % — AB (ref 40.0–52.0)
HGB: 11.2 g/dL — ABNORMAL LOW (ref 13.0–18.0)
MCH: 31.9 pg (ref 26.0–34.0)
MCHC: 34.1 g/dL (ref 32.0–36.0)
MCV: 94 fL (ref 80–100)
PLATELETS: 77 10*3/uL — AB (ref 150–440)
RBC: 3.5 10*6/uL — AB (ref 4.40–5.90)
RDW: 14.7 % — ABNORMAL HIGH (ref 11.5–14.5)
WBC: 9.6 10*3/uL (ref 3.8–10.6)

## 2014-08-13 LAB — CBC WITH DIFFERENTIAL/PLATELET
BASOS PCT: 0.3 %
Basophil #: 0 10*3/uL (ref 0.0–0.1)
EOS ABS: 0 10*3/uL (ref 0.0–0.7)
Eosinophil %: 0.3 %
HCT: 30.5 % — ABNORMAL LOW (ref 40.0–52.0)
HGB: 10.5 g/dL — ABNORMAL LOW (ref 13.0–18.0)
LYMPHS ABS: 0.9 10*3/uL — AB (ref 1.0–3.6)
Lymphocyte %: 7.6 %
MCH: 31.6 pg (ref 26.0–34.0)
MCHC: 34.3 g/dL (ref 32.0–36.0)
MCV: 92 fL (ref 80–100)
MONO ABS: 0.5 x10 3/mm (ref 0.2–1.0)
MONOS PCT: 4.2 %
NEUTROS PCT: 87.6 %
Neutrophil #: 10.3 10*3/uL — ABNORMAL HIGH (ref 1.4–6.5)
Platelet: 64 10*3/uL — ABNORMAL LOW (ref 150–440)
RBC: 3.3 10*6/uL — ABNORMAL LOW (ref 4.40–5.90)
RDW: 14.4 % (ref 11.5–14.5)
WBC: 11.7 10*3/uL — AB (ref 3.8–10.6)

## 2014-08-13 LAB — PROTIME-INR
INR: 1.3
PROTHROMBIN TIME: 15.8 s — AB (ref 11.5–14.7)

## 2014-08-13 LAB — CK TOTAL AND CKMB (NOT AT ARMC)
CK, Total: 169 U/L
CK-MB: 2.3 ng/mL (ref 0.5–3.6)

## 2014-08-13 LAB — TROPONIN I: Troponin-I: <0.02 ng/mL

## 2014-08-13 LAB — AMMONIA: AMMONIA, PLASMA: 45 umol/L — AB (ref 11–32)

## 2014-08-14 LAB — CBC WITH DIFFERENTIAL/PLATELET
BASOS ABS: 0 10*3/uL (ref 0.0–0.1)
Basophil %: 0.4 %
EOS ABS: 0.2 10*3/uL (ref 0.0–0.7)
EOS PCT: 1.7 %
HCT: 28.6 % — ABNORMAL LOW (ref 40.0–52.0)
HGB: 9.9 g/dL — ABNORMAL LOW (ref 13.0–18.0)
LYMPHS ABS: 1 10*3/uL (ref 1.0–3.6)
Lymphocyte %: 9.9 %
MCH: 32.1 pg (ref 26.0–34.0)
MCHC: 34.6 g/dL (ref 32.0–36.0)
MCV: 93 fL (ref 80–100)
MONOS PCT: 5.9 %
Monocyte #: 0.6 x10 3/mm (ref 0.2–1.0)
NEUTROS PCT: 82.1 %
Neutrophil #: 8.1 10*3/uL — ABNORMAL HIGH (ref 1.4–6.5)
Platelet: 60 10*3/uL — ABNORMAL LOW (ref 150–440)
RBC: 3.09 10*6/uL — ABNORMAL LOW (ref 4.40–5.90)
RDW: 14.6 % — AB (ref 11.5–14.5)
WBC: 9.8 10*3/uL (ref 3.8–10.6)

## 2014-08-14 LAB — BASIC METABOLIC PANEL
ANION GAP: 8 (ref 7–16)
BUN: 26 mg/dL — AB (ref 7–18)
CALCIUM: 8.3 mg/dL — AB (ref 8.5–10.1)
CHLORIDE: 109 mmol/L — AB (ref 98–107)
Co2: 26 mmol/L (ref 21–32)
Creatinine: 2.32 mg/dL — ABNORMAL HIGH (ref 0.60–1.30)
EGFR (African American): 37 — ABNORMAL LOW
EGFR (Non-African Amer.): 30 — ABNORMAL LOW
Glucose: 161 mg/dL — ABNORMAL HIGH (ref 65–99)
Osmolality: 293 (ref 275–301)
POTASSIUM: 4.3 mmol/L (ref 3.5–5.1)
Sodium: 143 mmol/L (ref 136–145)

## 2014-08-14 LAB — AMMONIA: AMMONIA, PLASMA: 54 umol/L — AB (ref 11–32)

## 2014-08-15 LAB — CBC WITH DIFFERENTIAL/PLATELET
BASOS ABS: 0 10*3/uL (ref 0.0–0.1)
BASOS PCT: 0.4 %
EOS PCT: 4.4 %
Eosinophil #: 0.4 10*3/uL (ref 0.0–0.7)
HCT: 28.5 % — ABNORMAL LOW (ref 40.0–52.0)
HGB: 9.8 g/dL — AB (ref 13.0–18.0)
LYMPHS PCT: 16.4 %
Lymphocyte #: 1.4 10*3/uL (ref 1.0–3.6)
MCH: 31.9 pg (ref 26.0–34.0)
MCHC: 34.5 g/dL (ref 32.0–36.0)
MCV: 93 fL (ref 80–100)
MONOS PCT: 7.6 %
Monocyte #: 0.6 x10 3/mm (ref 0.2–1.0)
Neutrophil #: 6.1 10*3/uL (ref 1.4–6.5)
Neutrophil %: 71.2 %
Platelet: 71 10*3/uL — ABNORMAL LOW (ref 150–440)
RBC: 3.08 10*6/uL — ABNORMAL LOW (ref 4.40–5.90)
RDW: 14.8 % — ABNORMAL HIGH (ref 11.5–14.5)
WBC: 8.5 10*3/uL (ref 3.8–10.6)

## 2014-08-15 LAB — OCCULT BLOOD X 1 CARD TO LAB, STOOL: Occult Blood, Feces: POSITIVE

## 2014-08-16 LAB — CBC WITH DIFFERENTIAL/PLATELET
BASOS ABS: 0.1 10*3/uL (ref 0.0–0.1)
Basophil %: 0.7 %
EOS PCT: 5 %
Eosinophil #: 0.4 10*3/uL (ref 0.0–0.7)
HCT: 28.9 % — ABNORMAL LOW (ref 40.0–52.0)
HGB: 10.2 g/dL — AB (ref 13.0–18.0)
Lymphocyte #: 1.4 10*3/uL (ref 1.0–3.6)
Lymphocyte %: 18.9 %
MCH: 32.1 pg (ref 26.0–34.0)
MCHC: 35.1 g/dL (ref 32.0–36.0)
MCV: 91 fL (ref 80–100)
Monocyte #: 0.5 x10 3/mm (ref 0.2–1.0)
Monocyte %: 7 %
NEUTROS PCT: 68.4 %
Neutrophil #: 5.1 10*3/uL (ref 1.4–6.5)
Platelet: 80 10*3/uL — ABNORMAL LOW (ref 150–440)
RBC: 3.16 10*6/uL — ABNORMAL LOW (ref 4.40–5.90)
RDW: 14.3 % (ref 11.5–14.5)
WBC: 7.5 10*3/uL (ref 3.8–10.6)

## 2014-08-16 LAB — BASIC METABOLIC PANEL
ANION GAP: 7 (ref 7–16)
BUN: 20 mg/dL — AB (ref 7–18)
CREATININE: 1.82 mg/dL — AB (ref 0.60–1.30)
Calcium, Total: 8 mg/dL — ABNORMAL LOW (ref 8.5–10.1)
Chloride: 108 mmol/L — ABNORMAL HIGH (ref 98–107)
Co2: 25 mmol/L (ref 21–32)
EGFR (African American): 49 — ABNORMAL LOW
GFR CALC NON AF AMER: 40 — AB
Glucose: 114 mg/dL — ABNORMAL HIGH (ref 65–99)
OSMOLALITY: 283 (ref 275–301)
Potassium: 4 mmol/L (ref 3.5–5.1)
Sodium: 140 mmol/L (ref 136–145)

## 2014-08-17 LAB — CBC WITH DIFFERENTIAL/PLATELET
BASOS ABS: 0 10*3/uL (ref 0.0–0.1)
BASOS PCT: 0.8 %
Eosinophil #: 0.3 10*3/uL (ref 0.0–0.7)
Eosinophil %: 5.2 %
HCT: 30.2 % — ABNORMAL LOW (ref 40.0–52.0)
HGB: 10.4 g/dL — ABNORMAL LOW (ref 13.0–18.0)
LYMPHS ABS: 1.2 10*3/uL (ref 1.0–3.6)
LYMPHS PCT: 19.9 %
MCH: 31.7 pg (ref 26.0–34.0)
MCHC: 34.3 g/dL (ref 32.0–36.0)
MCV: 92 fL (ref 80–100)
MONO ABS: 0.5 x10 3/mm (ref 0.2–1.0)
Monocyte %: 8.1 %
Neutrophil #: 4.1 10*3/uL (ref 1.4–6.5)
Neutrophil %: 66 %
PLATELETS: 87 10*3/uL — AB (ref 150–440)
RBC: 3.27 10*6/uL — ABNORMAL LOW (ref 4.40–5.90)
RDW: 14.5 % (ref 11.5–14.5)
WBC: 6.2 10*3/uL (ref 3.8–10.6)

## 2014-09-10 ENCOUNTER — Inpatient Hospital Stay: Payer: Self-pay | Admitting: Internal Medicine

## 2014-09-10 LAB — CBC WITH DIFFERENTIAL/PLATELET
BASOS PCT: 0.5 %
Basophil #: 0.1 10*3/uL (ref 0.0–0.1)
Basophil #: 0 10*3/uL (ref 0.0–0.1)
Basophil %: 0.3 %
EOS ABS: 0.1 10*3/uL (ref 0.0–0.7)
EOS ABS: 0 10*3/uL (ref 0.0–0.7)
EOS PCT: 1.2 %
EOS PCT: 0.2 %
HCT: 29.9 % — ABNORMAL LOW (ref 40.0–52.0)
HCT: 29.9 % — ABNORMAL LOW (ref 40.0–52.0)
HGB: 10.1 g/dL — ABNORMAL LOW (ref 13.0–18.0)
HGB: 10.1 g/dL — ABNORMAL LOW (ref 13.0–18.0)
LYMPHS ABS: 0.7 10*3/uL — AB (ref 1.0–3.6)
LYMPHS PCT: 6.1 %
Lymphocyte #: 1 10*3/uL (ref 1.0–3.6)
Lymphocyte %: 7.2 %
MCH: 31.2 pg (ref 26.0–34.0)
MCH: 31.2 pg (ref 26.0–34.0)
MCHC: 33.7 g/dL (ref 32.0–36.0)
MCHC: 33.8 g/dL (ref 32.0–36.0)
MCV: 92 fL (ref 80–100)
MCV: 92 fL (ref 80–100)
Monocyte #: 0.5 x10 3/mm (ref 0.2–1.0)
Monocyte #: 0.6 x10 3/mm (ref 0.2–1.0)
Monocyte %: 4.1 %
Monocyte %: 4.1 %
Neutrophil #: 9.8 10*3/uL — ABNORMAL HIGH (ref 1.4–6.5)
Neutrophil #: 11.8 10*3/uL — ABNORMAL HIGH (ref 1.4–6.5)
Neutrophil %: 88.1 %
Neutrophil %: 88.2 %
Platelet: 102 10*3/uL — ABNORMAL LOW (ref 150–440)
Platelet: 87 10*3/uL — ABNORMAL LOW (ref 150–440)
RBC: 3.24 10*6/uL — ABNORMAL LOW (ref 4.40–5.90)
RBC: 3.25 10*6/uL — ABNORMAL LOW (ref 4.40–5.90)
RDW: 13.9 % (ref 11.5–14.5)
RDW: 14.2 % (ref 11.5–14.5)
WBC: 11.1 10*3/uL — ABNORMAL HIGH (ref 3.8–10.6)
WBC: 13.4 10*3/uL — ABNORMAL HIGH (ref 3.8–10.6)

## 2014-09-10 LAB — COMPREHENSIVE METABOLIC PANEL
ALBUMIN: 2.8 g/dL — AB (ref 3.4–5.0)
ALK PHOS: 61 U/L
Anion Gap: 7 (ref 7–16)
BILIRUBIN TOTAL: 0.9 mg/dL (ref 0.2–1.0)
BUN: 18 mg/dL (ref 7–18)
CALCIUM: 8.7 mg/dL (ref 8.5–10.1)
Chloride: 106 mmol/L (ref 98–107)
Co2: 26 mmol/L (ref 21–32)
Creatinine: 2.08 mg/dL — ABNORMAL HIGH (ref 0.60–1.30)
EGFR (African American): 42 — ABNORMAL LOW
EGFR (Non-African Amer.): 35 — ABNORMAL LOW
Glucose: 123 mg/dL — ABNORMAL HIGH (ref 65–99)
OSMOLALITY: 281 (ref 275–301)
POTASSIUM: 3.7 mmol/L (ref 3.5–5.1)
SGOT(AST): 27 U/L (ref 15–37)
SGPT (ALT): 24 U/L
SODIUM: 139 mmol/L (ref 136–145)
TOTAL PROTEIN: 6.9 g/dL (ref 6.4–8.2)

## 2014-09-10 LAB — URINALYSIS, COMPLETE
BILIRUBIN, UR: NEGATIVE
Bacteria: NONE SEEN
GLUCOSE, UR: NEGATIVE mg/dL (ref 0–75)
Hyaline Cast: 2
KETONE: NEGATIVE
LEUKOCYTE ESTERASE: NEGATIVE
NITRITE: NEGATIVE
Ph: 5 (ref 4.5–8.0)
Protein: 30
RBC,UR: 1 /HPF (ref 0–5)
Specific Gravity: 1.016 (ref 1.003–1.030)
Squamous Epithelial: 1

## 2014-09-10 LAB — LIPASE, BLOOD: Lipase: 146 U/L (ref 73–393)

## 2014-09-10 LAB — AMMONIA: Ammonia, Plasma: 54 mcmol/L — ABNORMAL HIGH (ref 11–32)

## 2014-09-11 LAB — COMPREHENSIVE METABOLIC PANEL
ALK PHOS: 52 U/L
ALT: 18 U/L
AST: 18 U/L (ref 15–37)
Albumin: 2.4 g/dL — ABNORMAL LOW (ref 3.4–5.0)
Anion Gap: 5 — ABNORMAL LOW (ref 7–16)
BUN: 19 mg/dL — ABNORMAL HIGH (ref 7–18)
Bilirubin,Total: 1 mg/dL (ref 0.2–1.0)
CALCIUM: 7.9 mg/dL — AB (ref 8.5–10.1)
CREATININE: 2.26 mg/dL — AB (ref 0.60–1.30)
Chloride: 108 mmol/L — ABNORMAL HIGH (ref 98–107)
Co2: 25 mmol/L (ref 21–32)
EGFR (Non-African Amer.): 31 — ABNORMAL LOW
GFR CALC AF AMER: 38 — AB
Glucose: 147 mg/dL — ABNORMAL HIGH (ref 65–99)
Osmolality: 281 (ref 275–301)
Potassium: 3.8 mmol/L (ref 3.5–5.1)
Sodium: 138 mmol/L (ref 136–145)
TOTAL PROTEIN: 6.4 g/dL (ref 6.4–8.2)

## 2014-09-11 LAB — CBC WITH DIFFERENTIAL/PLATELET
BASOS ABS: 0.1 10*3/uL (ref 0.0–0.1)
Basophil %: 0.5 %
Eosinophil #: 0.2 10*3/uL (ref 0.0–0.7)
Eosinophil %: 2.3 %
HCT: 29.2 % — AB (ref 40.0–52.0)
HGB: 9.9 g/dL — ABNORMAL LOW (ref 13.0–18.0)
LYMPHS ABS: 1.2 10*3/uL (ref 1.0–3.6)
Lymphocyte %: 11 %
MCH: 31.4 pg (ref 26.0–34.0)
MCHC: 33.9 g/dL (ref 32.0–36.0)
MCV: 93 fL (ref 80–100)
MONO ABS: 0.7 x10 3/mm (ref 0.2–1.0)
Monocyte %: 6.7 %
NEUTROS ABS: 8.4 10*3/uL — AB (ref 1.4–6.5)
Neutrophil %: 79.5 %
PLATELETS: 91 10*3/uL — AB (ref 150–440)
RBC: 3.15 10*6/uL — ABNORMAL LOW (ref 4.40–5.90)
RDW: 14.6 % — ABNORMAL HIGH (ref 11.5–14.5)
WBC: 10.6 10*3/uL (ref 3.8–10.6)

## 2014-09-12 LAB — BASIC METABOLIC PANEL
Anion Gap: 6 — ABNORMAL LOW (ref 7–16)
BUN: 20 mg/dL — ABNORMAL HIGH (ref 7–18)
CHLORIDE: 108 mmol/L — AB (ref 98–107)
CO2: 26 mmol/L (ref 21–32)
Calcium, Total: 8.1 mg/dL — ABNORMAL LOW (ref 8.5–10.1)
Creatinine: 2.14 mg/dL — ABNORMAL HIGH (ref 0.60–1.30)
EGFR (African American): 41 — ABNORMAL LOW
EGFR (Non-African Amer.): 33 — ABNORMAL LOW
Glucose: 188 mg/dL — ABNORMAL HIGH (ref 65–99)
OSMOLALITY: 287 (ref 275–301)
POTASSIUM: 3.7 mmol/L (ref 3.5–5.1)
Sodium: 140 mmol/L (ref 136–145)

## 2014-09-12 LAB — CBC WITH DIFFERENTIAL/PLATELET
BASOS ABS: 0 10*3/uL (ref 0.0–0.1)
Basophil %: 0.4 %
EOS PCT: 3.6 %
Eosinophil #: 0.3 10*3/uL (ref 0.0–0.7)
HCT: 28.5 % — AB (ref 40.0–52.0)
HGB: 9.6 g/dL — ABNORMAL LOW (ref 13.0–18.0)
LYMPHS PCT: 13.2 %
Lymphocyte #: 1.1 10*3/uL (ref 1.0–3.6)
MCH: 31 pg (ref 26.0–34.0)
MCHC: 33.7 g/dL (ref 32.0–36.0)
MCV: 92 fL (ref 80–100)
MONO ABS: 0.6 x10 3/mm (ref 0.2–1.0)
Monocyte %: 6.8 %
Neutrophil #: 6.1 10*3/uL (ref 1.4–6.5)
Neutrophil %: 76 %
Platelet: 92 10*3/uL — ABNORMAL LOW (ref 150–440)
RBC: 3.11 10*6/uL — ABNORMAL LOW (ref 4.40–5.90)
RDW: 14.2 % (ref 11.5–14.5)
WBC: 8.1 10*3/uL (ref 3.8–10.6)

## 2014-09-13 LAB — VANCOMYCIN, TROUGH: Vancomycin, Trough: 17 ug/mL (ref 10–20)

## 2014-09-14 LAB — BASIC METABOLIC PANEL
Anion Gap: 6 — ABNORMAL LOW (ref 7–16)
BUN: 18 mg/dL (ref 7–18)
CHLORIDE: 104 mmol/L (ref 98–107)
CREATININE: 2.05 mg/dL — AB (ref 0.60–1.30)
Calcium, Total: 8.8 mg/dL (ref 8.5–10.1)
Co2: 28 mmol/L (ref 21–32)
EGFR (Non-African Amer.): 35 — ABNORMAL LOW
GFR CALC AF AMER: 43 — AB
Glucose: 130 mg/dL — ABNORMAL HIGH (ref 65–99)
OSMOLALITY: 279 (ref 275–301)
Potassium: 3.4 mmol/L — ABNORMAL LOW (ref 3.5–5.1)
SODIUM: 138 mmol/L (ref 136–145)

## 2014-09-14 LAB — CBC WITH DIFFERENTIAL/PLATELET
BASOS ABS: 0 10*3/uL (ref 0.0–0.1)
BASOS PCT: 0.6 %
Eosinophil #: 0.3 10*3/uL (ref 0.0–0.7)
Eosinophil %: 3.9 %
HCT: 28.2 % — ABNORMAL LOW (ref 40.0–52.0)
HGB: 9.7 g/dL — AB (ref 13.0–18.0)
LYMPHS PCT: 16.2 %
Lymphocyte #: 1.1 10*3/uL (ref 1.0–3.6)
MCH: 31.2 pg (ref 26.0–34.0)
MCHC: 34.5 g/dL (ref 32.0–36.0)
MCV: 91 fL (ref 80–100)
MONO ABS: 0.4 x10 3/mm (ref 0.2–1.0)
Monocyte %: 6.2 %
NEUTROS ABS: 5.1 10*3/uL (ref 1.4–6.5)
Neutrophil %: 73.1 %
Platelet: 106 10*3/uL — ABNORMAL LOW (ref 150–440)
RBC: 3.12 10*6/uL — AB (ref 4.40–5.90)
RDW: 13.9 % (ref 11.5–14.5)
WBC: 7 10*3/uL (ref 3.8–10.6)

## 2014-09-15 LAB — CULTURE, BLOOD (SINGLE)

## 2014-09-17 DIAGNOSIS — L02419 Cutaneous abscess of limb, unspecified: Secondary | ICD-10-CM | POA: Insufficient documentation

## 2014-10-06 DIAGNOSIS — I872 Venous insufficiency (chronic) (peripheral): Secondary | ICD-10-CM | POA: Insufficient documentation

## 2014-11-30 ENCOUNTER — Emergency Department: Payer: Self-pay | Admitting: Emergency Medicine

## 2015-01-22 NOTE — Consult Note (Signed)
Chief Complaint:  Subjective/Chief Complaint Pt denies any rectal bleeding, melena, abdominal pain or nausea.   VITAL SIGNS/ANCILLARY NOTES: **Vital Signs.:   17-Nov-15 07:43  Vital Signs Type Q 4hr  Temperature Temperature (F) 98.4  Temperature Source oral  Pulse Pulse 43  Respirations Respirations 18  Systolic BP Systolic BP 123456  Diastolic BP (mmHg) Diastolic BP (mmHg) 55  Mean BP 76  Pulse Ox % Pulse Ox % 99  Pulse Ox Activity Level  At rest  Oxygen Delivery Room Air/ 21 %   Brief Assessment:  GEN well developed, well nourished, no acute distress, A/Ox3   Cardiac Regular   Respiratory normal resp effort  no use of accessory muscles   Gastrointestinal Normal   Gastrointestinal details normal Soft  Nontender  Bowel sounds normal  +obese, nontense   EXTR +chronic massive LEE bilat   Additional Physical Exam Skin: pink, warm, dry, no jaundice   Lab Results:  Routine Hem:  17-Nov-15 04:50   WBC (CBC) 6.2  RBC (CBC)  3.27  Hemoglobin (CBC)  10.4  Hematocrit (CBC)  30.2  Platelet Count (CBC)  87  MCV 92  MCH 31.7  MCHC 34.3  RDW 14.5  Neutrophil % 66.0  Lymphocyte % 19.9  Monocyte % 8.1  Eosinophil % 5.2  Basophil % 0.8  Neutrophil # 4.1  Lymphocyte # 1.2  Monocyte # 0.5  Eosinophil # 0.3  Basophil # 0.0 (Result(s) reported on 17 Aug 2014 at 05:44AM.)   Assessment/Plan:  Assessment/Plan:  Assessment Upper GI bleed secondary to gastric polyp s/p intervention Anemia: Hgb stable NAFLD Cirrhosis: Stable I have discussed her care with Dr Evangeline Gula [facility] & our plan of care is below.   Plan 1) Patient will follow up with Dr Vira Agar & repeat EGD 6 weeks 2) Continue BID PPI will sign off, please call if you have any questions or concerns   Electronic Signatures: Andria Meuse (NP)  (Signed 17-Nov-15 10:32)  Authored: Chief Complaint, VITAL SIGNS/ANCILLARY NOTES, Brief Assessment, Lab Results, Assessment/Plan   Last Updated: 17-Nov-15 10:32 by Andria Meuse (NP)

## 2015-01-22 NOTE — Consult Note (Signed)
Present Illness The patient is a 64 year old male with a history of nonalcoholic fatty liver, liver cirrhosis, esophageal varices, presented to the ED several days ago with increasing lethargy and confusion.  According to the patient???s history he has been confused a week or so.  In addition patient feels dizzy, and thirsty but the patient denies any fever or chills. No abdominal pain, nausea, or vomiting, but has diarrhea due to lactulose. The patient denies any other symptoms.  During this admission it was identified that he has severe lymphedema of both lower extremities.  He denies leg pain at this time.  He does note he has had an ulceration of the left leg which was treated at the [facility].  He does have compression socks but finds them extremely difficult to put on and painful to wear.  No history of DVT.  PAST MEDICAL HISTORY: Hypertension, diabetes, nonalcoholic fatty liver disease, liver cirrhosis, esophageal varices, morbid obesity, OSA, CKD stage III.   Home Medications: Medication Instructions Status  omeprazole 40 mg oral delayed release capsule 1 cap(s) orally 2 times a day Active  nadolol 40 mg tablet 1 tab(s) orally once a day  Active  simvastatin 40 mg oral tablet 1 tab(s) orally once a day (at bedtime) Active  Xifaxan 550 mg oral tablet 1 tab(s) orally 2 times a day Active  furosemide 40 mg oral tablet 1 tab(s) orally 2 times a day Active  NovoLOG Mix 70/30 FlexPen 30 units-70 units/mL subcutaneous suspension 41 unit(s) subcutaneous  2 times in the morning and 31 units 2 times in the evening  Active  spironolactone 25 mg oral tablet 1 tab(s) orally once a day Active    No Known Allergies:   Case History:  Family History Non-Contributory   Social History negative tobacco, negative ETOH, negative Illicit drugs   Review of Systems:  Fever/Chills No   Cough No   Sputum No   Abdominal Pain No   Diarrhea Yes   Constipation No   Nausea/Vomiting No   SOB/DOE No    Chest Pain No   Telemetry Reviewed NSR   Dysuria No   Tolerating PT No   Physical Exam:  GEN well developed, well nourished, no acute distress   HEENT hearing intact to voice, moist oral mucosa   NECK supple  trachea midline   RESP normal resp effort  no use of accessory muscles   CARD regular rate  LE edema present   ABD denies tenderness  denies Flank Tenderness   EXTR negative cyanosis/clubbing, positive edema, fairly massive lymphedema of both legs with marked skin changes, no wounds at this tiem.  feet pink and warm but unable to palpate pedal pulses.   SKIN positive rashes, No ulcers, tight to palpation   NEURO cranial nerves intact, follows commands   PSYCH alert, A+O to time, place, person   Nursing/Ancillary Notes: **Vital Signs.:   12-Aug-15 13:30  Vital Signs Type Routine  Temperature Temperature (F) 98.4  Celsius 36.8  Pulse Pulse 51  Respirations Respirations 18  Systolic BP Systolic BP 277  Diastolic BP (mmHg) Diastolic BP (mmHg) 63  Mean BP 79  Pulse Ox % Pulse Ox % 99  Pulse Ox Activity Level  At rest  Oxygen Delivery Room Air/ 21 %   Hepatic:  12-Aug-15 04:11   Bilirubin, Total  1.1  Alkaline Phosphatase 46 (46-116 NOTE: New Reference Range 04/20/14)  SGPT (ALT) 20 (14-63 NOTE: New Reference Range 04/20/14)  SGOT (AST) 29  Total  Protein, Serum  5.8  Albumin, Serum  2.0  Routine Chem:  12-Aug-15 04:11   Glucose, Serum  172  BUN  25  Creatinine (comp)  1.98  Sodium, Serum 140  Potassium, Serum 3.7  Chloride, Serum  108  CO2, Serum 23  Calcium (Total), Serum  8.2  Osmolality (calc) 288  eGFR (African American)  41  eGFR (Non-African American)  35 (eGFR values <42m/min/1.73 m2 may be an indication of chronic kidney disease (CKD). Calculated eGFR is useful in patients with stable renal function. The eGFR calculation will not be reliable in acutely ill patients when serum creatinine is changing rapidly. It is not useful in   patients on dialysis. The eGFR calculation may not be applicable to patients at the low and high extremes of body sizes, pregnant women, and vegetarians.)  Anion Gap 9  Ammonia, Plasma  60 (Result(s) reported on 12 May 2014 at 05:06AM.)  Routine Hem:  12-Aug-15 04:11   WBC (CBC) 7.4  RBC (CBC)  3.04  Hemoglobin (CBC)  9.9  Hematocrit (CBC)  28.1  Platelet Count (CBC)  77  MCV 93  MCH 32.5  MCHC 35.1  RDW 14.4  Neutrophil % 74.5  Lymphocyte % 14.2  Monocyte % 6.8  Eosinophil % 3.8  Basophil % 0.7  Neutrophil # 5.5  Lymphocyte # 1.1  Monocyte # 0.5  Eosinophil # 0.3  Basophil # 0.0 (Result(s) reported on 12 May 2014 at 04:51AM.)    Impression 1.  Lymph edema          patient will requir compression he finds he is unable to wear his stockings therefore I believe that [place] wraps would work much better.  Also he should have a lymph pump for daily use.  He will follow up in the office with me to arrange for these devices. 2.  Chronic kidney disease.            Acute component largely resolved.  plan per medicine 3.  Hepatic encephalopathy.            GI following  4.  Thrombocytopenia.            serial labs no Tx at this time  5.  Hypertension.            continue home meds  6.  Diabetes.             ADA diet   Plan level 3 consult   Electronic Signatures: SHortencia Pilar(MD)  (Signed 12-Aug-15 17:20)  Authored: General Aspect/Present Illness, Home Medications, Allergies, History and Physical Exam, Vital Signs, Labs, Impression/Plan   Last Updated: 12-Aug-15 17:20 by SHortencia Pilar(MD)

## 2015-01-22 NOTE — Consult Note (Signed)
Chief Complaint:  Subjective/Chief Complaint Patient with cirrhosis and an upper GI bleed. The Hb has been stable. he denies any abd pain or nausea.   VITAL SIGNS/ANCILLARY NOTES: **Vital Signs.:   15-Nov-15 07:39  Vital Signs Type Q 8hr  Temperature Temperature (F) 97.8  Celsius 36.5  Temperature Source oral  Pulse Pulse 46  Respirations Respirations 18  Systolic BP Systolic BP [id number]  Diastolic BP (mmHg) Diastolic BP (mmHg) 74  Mean BP 101  Pulse Ox % Pulse Ox % 98  Pulse Ox Activity Level  At rest  Oxygen Delivery Room Air/ 21 %   Brief Assessment:  GEN well developed, well nourished, no acute distress   Respiratory normal resp effort  no use of accessory muscles   Additional Physical Exam Alert and orientated times 3   Lab Results: Routine Hem:  15-Nov-15 05:04   WBC (CBC) 8.5  RBC (CBC)  3.08  Hemoglobin (CBC)  9.8  Hematocrit (CBC)  28.5  Platelet Count (CBC)  71  MCV 93  MCH 31.9  MCHC 34.5  RDW  14.8  Neutrophil % 71.2  Lymphocyte % 16.4  Monocyte % 7.6  Eosinophil % 4.4  Basophil % 0.4  Neutrophil # 6.1  Lymphocyte # 1.4  Monocyte # 0.6  Eosinophil # 0.4  Basophil # 0.0 (Result(s) reported on 15 Aug 2014 at 05:41AM.)   Assessment/Plan:  Assessment/Plan:  Assessment Upper GI bleed.  With his history it is likely a variceal bleed.  Appears to have stopped.   Plan Patient will be set up for an EGD for tomorrow.   Electronic Signatures: Lucilla Lame (MD)  (Signed [phone] 10:23)  Authored: Chief Complaint, VITAL SIGNS/ANCILLARY NOTES, Brief Assessment, Lab Results, Assessment/Plan   Last Updated: 15-Nov-15 10:23 by Lucilla Lame (MD)

## 2015-01-22 NOTE — Consult Note (Signed)
PATIENT NAME:  Corey West, Corey West MR#:  [id number] DATE OF BIRTH:  20-Feb-1951  DATE OF CONSULTATION:  09/10/2014  REFERRING PHYSICIAN:  Cheral Marker. Ola Spurr, MD CONSULTING PHYSICIAN:  Andria Meuse, NP / Lucilla Lame, MD  GASTROENTEROLOGY CONSULTATION   PRIMARY GASTROENTEROLOGIST: Manya Silvas, MD  REASON FOR CONSULTATION: Hematemesis.   HISTORY OF PRESENT ILLNESS: Corey West is a 64 year old Caucasian male with a history of nonalcoholic cirrhosis/fatty liver disease. He has a history of remote esophageal varices. More recently, he had had an EGD by Dr. Allen Norris, 08/16/2014, for bleeding from multiple gastric polyps. EGD also showed LA grade A esophagitis, and 2 polyps were removed and clips were place. He had multiple other polyps as well.   He had done well and was undergoing treatment at the wound clinic for bilateral lower extremity cellulitis. Last night he developed fever and rigors, and awakened at 2:00 a.m. with a single episode of hematemesis. He describes the vomit as dark, coffee-ground emesis without any food or blood clots. He does report taking omeprazole 40 mg daily at home. He denies any heartburn, indigestion, or abdominal pain.   He is on iron 325 mg at home, as well. It is also noted that he is on Protonix 40 mg daily at home, but he did not bring his medications with him, and is unsure which PPI and tells me "I just take when I am told to take". He did see Dr. Vira Agar on Tuesday, as well. He is on lactulose and Xifaxan for a history of hepatic encephalopathy as well. His hemoglobin is stable since last discharge, and he has a creatinine of 3 with history of chronic kidney disease.   PAST MEDICAL AND SURGICAL HISTORY: NAFLD cirrhosis, remote esophageal varices, LA grade A esophagitis in November 2015, multiple gastric polyps, 2 polyps removed with 2 clips placed, portal hypertension, splenomegaly, hepatic encephalopathy, hypertension, portal gastropathy, diabetes mellitus  insulin-dependent, hyperlipidemia, obstructive sleep apnea, morbid obesity, chronic kidney disease stage 3, chronic iron deficiency anemia, and bilateral lower extremity cellulitis. He has had a right ulnar nerve release, tonsillectomy and adenoids removed.   MEDICATIONS PRIOR TO ADMISSION: Lasix 40 mg b.i.d., lactulose 10 g per 15 mL take 30 mL q. 8 hours, lorazepam 0.5 mg q. 8 hours p.r.n., atenolol 40 mg daily, NovoLog 70/30 take 84 units before breakfast and 62 units before dinner, omeprazole 40 mg daily, Protonix 40 mg daily, oxycodone 5 mg q. 6 hours p.r.n., simvastatin 40 mg daily, spironolactone 25 mg daily, Xifaxan 550 mg b.i.d.   ALLERGIES: No known drug allergies.   FAMILY HISTORY: Positive for stroke, coronary artery disease, diabetes mellitus. There is no known family history of liver disease or chronic GI problems.   SOCIAL HISTORY: He lives alone. There is no history of tobacco, alcohol or illicit drugs.   REVIEW OF SYSTEMS:  CONSTITUTIONAL: See history of present illness.  SKIN: See HPI.  Otherwise negative 12-point review of systems.   PHYSICAL EXAMINATION:  VITAL SIGNS: Height 70 inches, weight 321 pounds, BMI 46.1. Temperature 98.3, pulse 65, respirations 20, blood pressure 126/67, oxygen saturation 98% on room air.  GENERAL: He is a morbidly obese Caucasian male who is alert, oriented, pleasant, cooperative and in no acute distress.  HEENT: Sclerae clear. Conjunctivae pink. Oropharynx pink and moist without any lesions.  NECK: Supple, without mass or thyromegaly.  CHEST: Heart regular rate and rhythm. Normal S1, S2. No murmurs, clicks, rubs, or gallops.  LUNGS: Clear to auscultation bilaterally.  ABDOMEN: Obese  positive bowel sounds x 4. No bruits auscultated. Abdomen is soft, nontender, nondistended without palpable mass or hepatosplenomegaly. No rebound tenderness or guarding. Exam is limited, given patient's body habitus.  EXTREMITIES: He has extensive edema and erythema  to both lower extremities. No exudates. He has 2+ pedal edema bilaterally. Skin is otherwise pink, warm and dry.  NEUROLOGIC: Grossly intact.  MUSCULOSKELETAL: Good equal movement and strength bilaterally.  PSYCHIATRIC: Alert, cooperative. Normal mood and affect.   LABORATORY STUDIES: Glucose 126, creatinine 2.08; otherwise normal basic metabolic panel. Lipase 146. Ammonia 54, albumin 2.8; otherwise normal LFTs. White blood cell count 11.1, hemoglobin 10.1, hematocrit 29.9, platelets 102,000.   IMPRESSION: Mr. Erker is a very pleasant 64 year old Caucasian male with a history of morbid obesity, nonalcoholic fatty liver disease (NAFLD) cirrhosis, hepatic encephalopathy, portal hypertension, esophageal varices, reflux esophagitis, and gastric polyps. He was being treated for cellulitis through the wound clinic, and developed rigors and fever, and had a single episode of hematemesis at 2:00 a.m. He had an EGD 08/16/2014 by Dr. Allen Norris that showed LA grade A esophagitis, no varices, 2 gastric polyps removed requiring clips, and multiple other gastric polyps. EGD with Dr. Allen Norris today to look for source of bleeding. Differentials include recurrent bleeding from polyps, gastropathy oozing, esophagitis, Mallory-Weiss tear, peptic ulcer disease, or less likely varices since none seen on the last EGD. Hemoglobin has been stable since last discharge.   PLAN: 1.  Agree with Protonix drip. 2.  N.p.o.  3.  EGD today.  4.  Follow H and H.   Thank you for allowing [place] to participate in his care.    These services provided by Andria Meuse, NP under collaborative agreement with Lucilla Lame, MD.   ____________________________ Andria Meuse, NP klj:MT D: 09/10/2014 11:10:28 ET T: 09/10/2014 11:36:43 ET JOB#: [id number]  cc: Andria Meuse, NP, <Dictator> Manya Silvas, MD Andria Meuse FNP ELECTRONICALLY SIGNED 09/10/2014 23:53

## 2015-01-22 NOTE — H&P (Signed)
PATIENT NAME:  Corey West, Corey West MR#:  [id number] DATE OF BIRTH:  May 31, 1951  DATE OF ADMISSION:  05/09/2014  PRIMARY CARE PHYSICIAN:  Madelyn Brunner, MD   REFERRING PHYSICIAN:  Dr. Jasmine December.  CHIEF COMPLAINT: Confusion and weakness for 3 days.   HISTORY OF PRESENT ILLNESS: A 64 year old Caucasian male with a history of nonalcoholic fatty liver, liver cirrhosis, esophageal varices, presented to the ED with above chief complaint. The patient is awake, alert, oriented x 3. According to the patient's brother and cousins, the patient has been confused, and week for the past three days; in addition patient feels dizzy, and thirsty but the patient denies any fever or chills. No abdominal pain, nausea, or vomiting, but has diarrhea due to lactulose. The patient denies any other symptoms.    PAST MEDICAL HISTORY: Hypertension, diabetes, nonalcoholic fatty liver disease, liver cirrhosis, esophageal varices, morbid obesity, OSA, CKD stage III.   SOCIAL HISTORY: No smoking or drinking or illicit drugs.   FAMILY HISTORY: Father has a history of heart disease, and stroke; mother has diabetes.   SURGICAL HISTORY: Tonsillectomy, right arm surgery.   ALLERGIES: None.   HOME MEDICATIONS:  1.  Xifaxan 550 mg p.o. b.i.d. 2.  Spironolactone 25 mg p.o. daily.  3.  Zocor 40 mg p.o. at bedtime.  4.  Omeprazole 40 mg p.o. b.i.d.  5.  NovoLog 70/30 of 41 units subcutaneous b.i.d.  6.  Nadolol 40 mg p.o. daily.  7.  Lasix 40 mg p.o. b.i.d.   REVIEW OF SYSTEMS: CONSTITUTIONAL: Patient denies any fever or chills. No headache but has dizziness, and generalized weakness.  EYES: No double vision, blurry vision.  ENT: No postnasal drip, slurred speech, or dysphagia.  CARDIOVASCULAR: No chest pain, palpitation, orthopnea, nocturnal dyspnea; no leg edema.  PULMONARY: No cough, sputum, shortness of breath, or hemoptysis.  GASTROINTESTINAL: No abdominal pain, nausea, vomiting, but has diarrhea, and no melena or bloody  stool.  GENITOURINARY: No dysuria, hematuria, or incontinence.  SKIN: No rash or jaundice.  NEUROLOGIC: No syncope, loss of consciousness, or seizure.  ENDOCRINE: No polyuria, polydipsia, heat, or cold intolerance.  HEMATOLOGY: No easy bleeding, or bleeding.   VITAL SIGNS: Temperature 98.3, blood pressure of 109/70, pulse 68, oxygen saturation 99% on room air.   PHYSICAL EXAMINATION:  GENERAL: The patient is alert, awake, oriented, but has mild confusion.    HEENT: Pupils round, equal, and reactive to light, and accommodation, moist oral mucosa, clear oropharynx.  NECK: Supple. No JVD or carotid bruits are noted, no lymphadenopathy, no thyromegaly.  CARDIOVASCULAR: S1, S2, regular rate, rhythm, no murmurs, or gallops.  PULMONARY: Bilateral air entry, no wheezing, or rales, no use of accessory muscle to breathe.  ABDOMEN: Soft, obese, bowel sounds present, no distention, no tenderness; difficult to estimate whether the patient has organomegaly due to morbid obesity.  EXTREMITIES: Bilateral leg edema, mild erythema on the left lower extremity, on the knee, but has severe erythema, warmness, and tenderness on the right lower extremity, on the knee with chronic skin changes; difficult to estimate whether patient has pedal pulses due to obesity, and the chronic skin changes.  SKIN: No rash or jaundice, but there is buttock ulcer about 3 to 4 cm in diameter, stage I, without discharge or infection.  NEUROLOGIC: AO x 2,  follow commands, no focal deficit, power 4 out of 5, sensation intact.   LABORATORY DATA: Urinalysis is negative; CAT scan of head normal; abdominal 3-way x-ray negative; WBC 4.2, hemoglobin 12.4, lipase  113, glucose 259, BUN 20, creatinine 2.60, electrolytes are normal, INR 1.2, troponin less than 0.02, BNP 158, ammonia 66; EKG shows normal sinus rhythm at 73 bpm.   IMPRESSIONS: 1.  Hepatic encephalopathy.  2.  Acute renal failure on chronic kidney disease.  3.  Lower extremity  cellulitis.  4.  Thrombocytopenia.  5.  Hypertension.  6.  Diabetes.  7.  Morbid obesity.  8.  Obstructive sleep apnea.   PLAN OF TREATMENT:   1.  The patient will be admitted to a medical floor. We will start lactulose 30 mL every 6 hours, with a goal of loose stool 3 to 4 times a day, continue  Xifaxan,  and follow up ammonia level.  2.  Hold Lasix and Aldactone due to acute renal failure. We will give gentle rehydration, follow the BMP.  3.  Bilateral lower extremity cellulitis. Patient was treated with 1 dose of vancomycin in Emergency Department; I will start Unasyn, and follow up CBC.  4.  Diabetes. We will start a sliding scale, continue NovoLog 70/30.   I discussed the patient's condition and plan of treatment with the patient, and the patient's family member, patient wants full code.   TIME SPENT: About 63 minutes.    ____________________________ Demetrios Loll, MD qc:nt D: 05/09/2014 20:23:33 ET T: 05/09/2014 21:49:43 ET JOB#: [id number]  cc: Demetrios Loll, MD, <Dictator> Demetrios Loll MD ELECTRONICALLY SIGNED 05/13/2014 21:23

## 2015-01-22 NOTE — Consult Note (Signed)
Chief Complaint:  Subjective/Chief Complaint Please see full GI consult and brief consult note.  Patient admitted with syncopal episode.  Originally scheduled for egd today apparently for fu on h.o cirrhosis due to combination of fatty liver and remote etoh abuse.  History of UGIB with varices.  Patietn dehydrated, (possible from increased diuretics,)  on admission in the setting of ckd.  Further evaluation planned in regards to this issue.  Will plan EGD for monday pm if he is kept in the hospital until then, otherwise can be rescheduled as outpatient.  Ammonia noted high on admission, will start on xifaxan, as will not affect fluid status as with lactulose. with recheck in am.  Following.   VITAL SIGNS/ANCILLARY NOTES: **Vital Signs.:   06-Feb-15 21:01  Vital Signs Type Routine  Temperature Temperature (F) 98.4  Celsius 36.8  Temperature Source oral  Pulse Pulse 53  Respirations Respirations 18  Systolic BP Systolic BP 850  Diastolic BP (mmHg) Diastolic BP (mmHg) 72  Mean BP 98  Pulse Ox % Pulse Ox % 95  Pulse Ox Activity Level  At rest  Oxygen Delivery Room Air/ 21 %  *Intake and Output.:   06-Feb-15 17:19  Stool  large stool   Brief Assessment:  Cardiac Regular   Respiratory clear BS   Gastrointestinal details normal Nontender  Nondistended  Bowel sounds normal  obese, unable to palpate internal organs   Lab Results:  Hepatic:  06-Feb-15 15:39   Bilirubin, Total 0.6  Bilirubin, Direct 0.2 (Result(s) reported on 06 Nov 2013 at 04:20PM.)  Alkaline Phosphatase 66 (45-117 NOTE: New Reference Range 08/21/13)  SGPT (ALT) 15  SGOT (AST) 22  Total Protein, Serum 6.8  Albumin, Serum  2.9  Routine Chem:  05-Feb-15 12:01   Glucose, Serum  143  BUN  21  Creatinine (comp)  2.04  Sodium, Serum 139  Potassium, Serum 3.9  Chloride, Serum 106  CO2, Serum 27  Calcium (Total), Serum 9.4  Anion Gap  6  Osmolality (calc) 283  eGFR (African American)  40  eGFR (Non-African  American)  34 (eGFR values <33m/min/1.73 m2 may be an indication of chronic kidney disease (CKD). Calculated eGFR is useful in patients with stable renal function. The eGFR calculation will not be reliable in acutely ill patients when serum creatinine is changing rapidly. It is not useful in  patients on dialysis. The eGFR calculation may not be applicable to patients at the low and high extremes of body sizes, pregnant women, and vegetarians.)  06-Feb-15 04:47   Glucose, Serum 78  BUN  21  Creatinine (comp)  1.89  Sodium, Serum 139  Potassium, Serum 3.5  Chloride, Serum  109  CO2, Serum 28  Calcium (Total), Serum 9.1  Anion Gap  2  Osmolality (calc) 279  eGFR (African American)  43  eGFR (Non-African American)  37 (eGFR values <640mmin/1.73 m2 may be an indication of chronic kidney disease (CKD). Calculated eGFR is useful in patients with stable renal function. The eGFR calculation will not be reliable in acutely ill patients when serum creatinine is changing rapidly. It is not useful in  patients on dialysis. The eGFR calculation may not be applicable to patients at the low and high extremes of body sizes, pregnant women, and vegetarians.)    15:39   Ammonia, Plasma  75 (Result(s) reported on 06 Nov 2013 at 04:44PM.)  Cardiac:  05-Feb-15 12:01   Troponin I < 0.02 (0.00-0.05 0.05 ng/mL or less: NEGATIVE  Repeat testing in 3-6  hrs  if clinically indicated. >0.05 ng/mL: POTENTIAL  MYOCARDIAL INJURY. Repeat  testing in 3-6 hrs if  clinically indicated. NOTE: An increase or decrease  of 30% or more on serial  testing suggests a  clinically important change)  Routine UA:  05-Feb-15 12:01   Color (UA) Yellow  Clarity (UA) Clear  Glucose (UA) 50 mg/dL  Bilirubin (UA) Negative  Ketones (UA) Negative  Specific Gravity (UA) 1.013  Blood (UA) Negative  pH (UA) 6.0  Protein (UA) 30 mg/dL  Nitrite (UA) Negative  Leukocyte Esterase (UA) Trace (Result(s) reported on 05 Nov 2013 at 01:05PM.)  RBC (UA) 1 /HPF  WBC (UA) 9 /HPF  Bacteria (UA) TRACE  Epithelial Cells (UA) 2 /HPF (Result(s) reported on 05 Nov 2013 at 01:05PM.)  Routine Coag:  06-Feb-15 15:39   Prothrombin  15.7  INR 1.3 (INR reference interval applies to patients on anticoagulant therapy. A single INR therapeutic range for coumarins is not optimal for all indications; however, the suggested range for most indications is 2.0 - 3.0. Exceptions to the INR Reference Range may include: Prosthetic heart valves, acute myocardial infarction, prevention of myocardial infarction, and combinations of aspirin and anticoagulant. The need for a higher or lower target INR must be assessed individually. Reference: The Pharmacology and Management of the Vitamin K  antagonists: the seventh ACCP Conference on Antithrombotic and Thrombolytic Therapy. MVEHM.0947 Sept:126 (3suppl): [id number]. A HCT value >55% may artifactually increase the PT.  In one study,  the increase was an average of 25%. Reference:  "Effect on Routine and Special Coagulation Testing Values of Citrate Anticoagulant Adjustment in Patients with High HCT Values." American Journal of Clinical Pathology 2006;126:400-405.)  Routine Hem:  05-Feb-15 12:01   WBC (CBC) 5.1  RBC (CBC)  3.46  Hemoglobin (CBC)  8.5  Hematocrit (CBC)  26.7  Platelet Count (CBC)  86  MCV  77  MCH  24.6  MCHC  31.9  RDW  15.5  Neutrophil % 67.5  Lymphocyte % 18.2  Monocyte % 9.4  Eosinophil % 3.8  Basophil % 1.1  Neutrophil # 3.4  Lymphocyte #  0.9  Monocyte # 0.5  Eosinophil # 0.2  Basophil # 0.1 (Result(s) reported on 05 Nov 2013 at 12:18PM.)  06-Feb-15 04:47   WBC (CBC) 5.0  RBC (CBC)  3.08  Hemoglobin (CBC)  8.0  Hematocrit (CBC)  24.0  Platelet Count (CBC)  83  MCV  78  MCH  25.8  MCHC 33.2  RDW  16.1  Neutrophil % 62.7  Lymphocyte % 22.3  Monocyte % 10.2  Eosinophil % 4.0  Basophil % 0.8  Neutrophil # 3.2  Lymphocyte # 1.1  Monocyte # 0.5   Eosinophil # 0.2  Basophil # 0.0 (Result(s) reported on 06 Nov 2013 at 05:45AM.)   Radiology Results: CT:    05-Feb-15 15:19, CT Head Without Contrast  CT Head Without Contrast   REASON FOR EXAM:    syncope  COMMENTS:       PROCEDURE: CT  - CT HEAD WITHOUT CONTRAST  - Nov 05 2013  3:19PM     CLINICAL DATA:  Syncope.    EXAM:  CT HEAD WITHOUT CONTRAST    TECHNIQUE:  Contiguous axial images were obtained from the base of the skull  through the vertex without intravenous contrast.    COMPARISON:  None.  FINDINGS:  No acute intracranial abnormalities. Specifically, no evidence of  acute intracranial hemorrhage, no definite findings of  acute/subacute cerebral ischemia, no  mass, mass effect,  hydrocephalus or abnormal intra or extra-axial fluid collections.  Visualized paranasal sinuses and mastoids are well pneumatized. No  acute displaced skull fractures are identified.     IMPRESSION:  * No acute intracranial abnormalities.  *The appearance of the brain is normal.      Electronically Signed    By: Vinnie Langton M.D.    On: 11/05/2013 15:21         Verified By: Etheleen Mayhew, M.D.,   Electronic Signatures: Loistine Simas (MD)  (Signed 06-Feb-15 22:06)  Authored: Chief Complaint, VITAL SIGNS/ANCILLARY NOTES, Brief Assessment, Lab Results, Radiology Results   Last Updated: 06-Feb-15 22:06 by Loistine Simas (MD)

## 2015-01-22 NOTE — H&P (Signed)
PATIENT NAME:  Corey West, Corey West MR#:  [id number] DATE OF BIRTH:  11/29/1950  DATE OF ADMISSION:  08/13/2014  PRIMARY CARE PHYSICIAN:  Gilford Rile, MD   PRIMARY GASTROINTESTINAL PHYSICIAN:  Manya Silvas, MD  REFERRING EMERGENCY ROOM PHYSICIAN:  Shellia Cleverly, MD   CHIEF COMPLAINT: Hematemesis and weakness.   HISTORY OF PRESENT ILLNESS: This very pleasant 64 year old man with history of cirrhosis and known esophageal varices presents today after 1 episode of hematemesis and hematochezia this morning. He reports that yesterday evening he began to feel slightly nauseated before going to bed. He slept through the night and when he woke up went straight to the restroom and had severe nausea 1 large episode of emesis. He described a dark brown, red tinged material. He then had a bowel movement, which was seen by EMS and was reported to be bright red blood. He denies abdominal pain, diaphoresis, presyncope; he has not had any further episodes of emesis or hematochezia since this morning; at the time of presentation his hemoglobin is 11.2, his baseline; and blood pressure is stable at 122/48; hospital services is asked to admit  for further evaluation and treatment.   PAST MEDICAL HISTORY:  1.  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  2.  Esophageal varices grade 2 with banding in 2011.  3.  Portal hypertension and splenomegaly.  4.  Gastroesophageal reflux disease with Barrett esophagus.  5.  History of hepatic encephalopathy.  6.  Hypertension.  7.  Diabetes mellitus type 2.  8.  Hyperlipidemia.  9.  Obstructive sleep apnea on CPAP machine.  10.  Morbid obesity.  11.  Chronic kidney disease stage III.  12.  Chronic iron deficiency anemia and pancytopenia.  13.  Chronic lower extremity edema.   PAST SURGICAL HISTORY: 1.  Right ulnar nerve release.  2.  Tonsillectomy and adenoidectomy due to sleep apnea.    SOCIAL HISTORY: The patient currently lives alone. He uses a cane  for ambulation. He is accompanied by his niece, [name] who assists him in his care, she does not live with him and is not his official power of attorney.  He does not smoke cigarettes. He does not drink alcohol or use illicit substances.   FAMILY MEDICAL HISTORY:   Positive for stroke, coronary artery disease, diabetes.   ALLERGIES: No known allergies.   HOME MEDICATIONS:   1.  Xifaxan 550 mg 1 tablet twice a day.  2.  Spironolactone 25 mg 1 tablet once a day.  3.  Simvastatin 40 mg 1 tablet once a day.  4.  Pantoprazole 40 mg 1 tablet once a day.  5.  Oxycodone 5 mg 1 tablet every 6 hours as needed for pain.  6.  Omeprazole 40 mg 1 capsule once a day.  7.  NovoLog mix 70/30, patient reports taking 82 units in the morning, and 62 units in the evening.  8.  Nadolol 40 mg 1 tablet once a day.  9.  Lorazepam 0.5 mg 1 tablet every 8 hours as needed for anxiety.  10.  Lactulose 10 grams/15 mL oral syrup 30 mm orally every 8 hours.  11.  Furosemide 40 mg 1 tablet 2 times a day.  12.  Ferrous sulfate 325 mg 1 tablet once a day.  13.  Clindamycin 300 mg 1 capsule every 6 hours.  14.  Aleve sodium 220 mg oral tablet 1 tablet every 8 hours as needed for pain, note that he has not taking  this medication in the past few days.   REVIEW OF SYSTEMS:  GENERAL: No fevers, chills, fatigue, or change in weight.  HEENT: No change in hearing or vision; no pain in the eyes or ears, no difficulty swallowing or throat pain.  PULMONARY: He does have some trouble breathing and attributes this to obstructive sleep apnea, obesity, hypoventilation syndrome, no recent coughing, congestion, sputum production, or wheezing; he is short of breath with any exertion.  CARDIOVASCULAR: No chest pain, palpitations, syncope; does have a extremity edema thought to be due lymphedema rather than cardiogenic edema, no syncope.  GASTROINTESTINAL: Positive as above for hematemesis, hematochezia; negative for abdominal pain.   GENITOURINARY:  Negative for dysuria or frequency.  MUSCULOSKELETAL: No tender, swollen joints, does have a sore spot over his sacrum due to lack mobility; no myalgias or muscle weakness noted.  NEUROLOGIC: No seizures, headaches, CVA, confusion, focal numbness or weakness.  PSYCHIATRIC: No history of uncontrolled depression or anxiety; states that his mood has been stable; he does have a history of anxiety, but it is controlled with p.r.n. Ativan.   PHYSICAL EXAMINATION:  VITAL SIGNS:  Temperature 98.1, pulse 68, respirations 18, blood pressure 126/57, pulse oximetry 98% on room air.  GENERAL: No acute distress.   HEENT: Pupils are equal, round, and reactive to light, conjunctivae are clear with no icterus; extraocular motion is intact; oral mucous membranes are pink and moist; posterior oropharynx is clear with no exudate or edema, neck is an obese, no cervical lymphadenopathy or thyroid tenderness noted.  RESPIRATORY: Bibasilar crackles, otherwise lungs are clear, good air movement; he does get a little short of breath with conversation.  CARDIOVASCULAR: Distant heart sounds, regular rate and rhythm, no murmurs, rubs, or gallops, does have  skin thickening, no pitting edema; peripheral pulses 2+.    GASTROINTESTINAL: Abdomen soft, nontender, nondistended, obese, no guarding, no rebound, bowel sounds are decreased.  MUSCULOSKELETAL: Range of motion is normal in all joints, no joint effusions, strength is 5/5 throughout.  NEUROLOGIC: The cranial nerves II through XII are grossly intact, strength and sensation are intact; no tremor, no asterixis.   LABORATORY: Sodium 142, potassium 3.9, chloride 107, bicarbonate 26, BUN 21, creatinine 2.2, glucose 104, ammonia 45, total protein 7.1, albumin 3.1, bilirubin 1.3; LFTs normal; cardiac enzymes normal; white blood cells 9.6, hemoglobin 11.2, platelets 77,000, MCV 94, INR 1.3.   IMAGING: No imaging has been done.   ASSESSMENT AND PLAN:   Problem:  1.   Gastrointestinal bleeding: Likely upper gastrointestinal bleed with hematemesis and hematochezia secondary to esophageal variceal. He has been placed on octreotide drip and Protonix dip in the emergency room. We will Guaiac stools. Initial hemoglobin is robust at 11.2, which is his baseline and blood pressure is stable. He has not had any further episodes of hematemesis or hematochezia. We will admit to the regular floor. We will consult gastroenterology for further evaluation and treatment. The patient is n.p.o. at this time.  2.  Cirrhosis due to nonalcoholic fatty liver disease with history of encephalopathy: He is alert and oriented currently. He does feel slightly sleepy. His ammonia level is elevated. I will restart his lactulose. Continue spironolactone and propranolol.  3.  Acute on chronic kidney disease: His baseline creatinine is about 2.  Currently he is at 2.2.  We will provide gentle hydration. We will hold Lasix for now. This will need to be re-evaluated in the morning.  4.  Obstructive sleep apnea: We will provide continuous positive airway pressure while  inpatient.  5.  Hypertension: Blood pressure is controlled at this time. We will continue spironolactone and propranolol. In the setting of possible acute gastrointestinal bleed we will hold Lasix at this time and continue to monitor carefully.  6.  Diabetes mellitus type 2; patient is insulin-dependent requiring high doses of insulin: He is nothing by mouth currently. We will continue with modified regimen for NPO and monitor fingersticks.  7.  Anxiety: Continue prn Ativan.  8.  Prophylaxis: Patient will be on Protonix for gastrointestinal prophylaxis; he will not be on pharmacological deep venous thrombosis prophylaxis due to gastrointestinal bleed, place sequential compression devices.   TIME SPENT ON ADMISSION: Was 45 minutes.    ____________________________ Earleen Newport. Volanda Napoleon, MD cpw:nt D: 08/13/2014 14:41:05 ET T: 08/13/2014  15:21:19 ET JOB#: [id number]  cc: Barnetta Chapel P. Volanda Napoleon, MD, <Dictator> Aldean Jewett MD ELECTRONICALLY SIGNED 08/13/2014 18:00

## 2015-01-22 NOTE — Consult Note (Signed)
PATIENT NAME:  Corey West, Corey West MR#:  [id number] DATE OF BIRTH:  1951-06-15  DATE OF ADMISSION:  11/05/2013 DATE OF CONSULTATION:  11/06/2013  REFERRING PHYSICIAN:  Dr. Verdell Carmine. CONSULTING PHYSICIAN:  Theodore Demark, NP  GI consult was ordered by Dr. Verdell Carmine for evaluation of history of esophageal varices.    HISTORY OF PRESENT ILLNESS: I appreciate consult for this 64 year old Caucasian man with history of NAFLD cirrhosis  for evaluation as to EGD for reassessment of varices and history of Barrett esophagus. The patient is a difficult historian, disoriented at times, niece tries to help; however, she does not live with him and cannot provide all the information. Evidently, he had EGD due to upper GI bleeding in 2001, which found some eroded esophageal varices. The procedure was repeated the next day and they were banded. Has been on nadolol 40 mg p.o. daily since. Really has no GI complaints presently with the exception of intermittent abdominal swelling and lower extremity edema. He has not been taking his Lasix as directed and does not follow a two-gram sodium diet. By his report, he consumes a fair bit of fast food. It appears he has had difficulty with medication and dietary comprehension and adherence. He was admitted yesterday with a fall, presyncope, and has been feeling generally weak. Niece reports that he has been somewhat confused and disoriented. Was scheduled to have EGD repeated soon to re-evaluate varices. Did have ammonia assessment at [facility] last December with results of 224. He was started on lactulose, but evidently he did not continue this. He is not taking any Xifaxan. No ammonia, PT/INR or hepatic panel has been ordered of yet. His hemoglobin is stable at 8. His last hemoglobin in the middle of December at [facility] was 8.5.   REVIEW OF SYSTEMS: Ten systems reviewed; significant for erythema and edema to the bilateral lower extremities and a wound to the anterior  left lower shin that reportedly is better. Recent weight gain, otherwise negative.   PAST MEDICAL HISTORY: NAFLD, esophageal varices, hypertension, morbid obesity, obstructive sleep apnea, diabetes, chronic kidney disease stage III.   ALLERGIES:  NKDA.  SOCIAL HISTORY: No current tobacco. Used to drink alcohol, however, quit 10 to 12 years ago. No illicits. Lives at home alone.   FAMILY HISTORY: Significant for heart disease, stroke, diabetes.   CURRENT MEDICATIONS: Benazepril 40 mg p.o. daily, Flexeril 5 mg t.i.d. p.r.n.,  says he has not used this lately; Lasix 40 mg q.i.d., Ativan 0.5 mg b.i.d., nadolol 40 mg p.o. daily, states he has been taking this in the mornings; NovoLog 70/30, 72 units at bedtime, 82 units in the morning; omeprazole 40 mg b.i.d., simvastatin 40 mg daily, tramadol 50 mg 1 to 3 tabs q.6h.  p.r.n.   LABORATORY DATA:  Most recent:  Glucose 78, BUN 21, creatinine 1.89, sodium 139, potassium 3.5, GFR 28, calcium 9.1. Troponin less than 0.02. WBC 5, hemoglobin 8, hematocrit 24, platelets 83, red cells macrocytic. Head CT negative. Chest x-ray with stable cardiomegaly.   PHYSICAL EXAMINATION: VITAL SIGNS: Most recent: Temp 97.6, pulse 52, respiratory rate 20, blood pressure 156/70, SaO2 of 96% on room air.  GENERAL: Pleasant, obese man in no acute distress.  HEENT: Normocephalic, atraumatic. Sclerae are clear. No redness, drainage or inflammation to the eyes, nares or mouth.  NECK: Supple. No JVD, lymphadenopathy, thyromegaly.  CARDIAC: S1, S2, RRR. No MRG.  LUNGS: Respirations eupneic. Lungs clear.  ABDOMEN: Obese abdomen. Bowel sounds x 4. Nondistended, nontender. Difficult to  appreciate hepatosplenomegaly or other abnormalities. There are no guarding, peritoneal signs, rigidity. He is nontender.  EXTREMITIES:  Pitting edema 2 to 3+, with erythema to the anterior shins bilaterally. There is a 3 inch oblong, irregularly-shaped scab to the mid anterior aspect of the shin. No  cyanosis or clubbing. Strength 5/5.  NEUROLOGIC: Minimal asterixis, alert, oriented x 3, however, is forgetful, does not always answer questions directly. Speech clear. No facial droop.  PSYCHIATRIC: Limited insight, limited judgment, tangential.   IMPRESSION AND PLAN: Prior to repeating EGD, would reassess ammonia levels and treat for hepatic encephalopathy. He should have EGD as he has a history of Barrett's and varices by chart review, but this can be delayed if he is encephalopathic. Did have a lengthy discussion with the patient and family about cirrhosis, i.e., varices, ascites, hepatic encephalopathy, methods to control dietary measures and followup care. Evidently, he takes nadolol in the mornings. I have  advised him to take it around 5:00 p.m. for better efficacy. Of note, did have liver testing over at [facility] with negative hepatitis C antibody, negative hepatitis B surface and core antibodies, negative hepatitis B surface antigen, negative hepatitis A IgM, negative AMA, negative ASMA and normal serum iron.   These services were provided by Stephens November, MSN, [facility], in collaboration with Lollie Sails, M.D., with whom I have discussed this patient in full.   Thank you very much for this consult   ____________________________ Theodore Demark, NP chl:dmm D: 11/06/2013 19:20:21 ET T: 11/06/2013 19:53:16 ET JOB#: [id number]  cc: Theodore Demark, NP, <Dictator> [place] SIGNED 12/02/2013 8:17

## 2015-01-22 NOTE — Consult Note (Signed)
Chief Complaint:  Subjective/Chief Complaint Pt doing well.  Denies diarrhea.  Tolerating diet well.   VITAL SIGNS/ANCILLARY NOTES: **Vital Signs.:   12-Aug-15 05:30  Vital Signs Type Routine  Temperature Temperature (F) 98  Celsius 36.6  Temperature Source oral  Pulse Pulse 52  Respirations Respirations 20  Systolic BP Systolic BP 373  Diastolic BP (mmHg) Diastolic BP (mmHg) 62  Mean BP 82  Pulse Ox % Pulse Ox % 96  Pulse Ox Activity Level  At rest  Oxygen Delivery Room Air/ 21 %    08:42  Temperature Temperature (F) 98  Celsius 36.6  Temperature Source oral  Pulse Pulse 56  Respirations Respirations 18  Systolic BP Systolic BP 428  Diastolic BP (mmHg) Diastolic BP (mmHg) 69  Mean BP 87  Pulse Ox % Pulse Ox % 97  Pulse Ox Activity Level  At rest  Oxygen Delivery Room Air/ 21 %   Brief Assessment:  GEN no acute distress, obese, A/Ox3, good insight.   Cardiac Regular   Respiratory normal resp effort   Gastrointestinal details normal Bowel sounds normal  No rebound tenderness  No gaurding  Protuberant,   EXTR +massive edema bilat, erythema RLE/ankle   Lab Results: Hepatic:  12-Aug-15 04:11   Bilirubin, Total  1.1  Alkaline Phosphatase 46 (46-116 NOTE: New Reference Range 04/20/14)  SGPT (ALT) 20 (14-63 NOTE: New Reference Range 04/20/14)  SGOT (AST) 29  Total Protein, Serum  5.8  Albumin, Serum  2.0  Routine Chem:  12-Aug-15 04:11   Glucose, Serum  172  BUN  25  Creatinine (comp)  1.98  Sodium, Serum 140  Potassium, Serum 3.7  Chloride, Serum  108  CO2, Serum 23  Calcium (Total), Serum  8.2  Osmolality (calc) 288  eGFR (African American)  41  eGFR (Non-African American)  35 (eGFR values <81mL/min/1.73 m2 may be an indication of chronic kidney disease (CKD). Calculated eGFR is useful in patients with stable renal function. The eGFR calculation will not be reliable in acutely ill patients when serum creatinine is changing rapidly. It is not useful  in  patients on dialysis. The eGFR calculation may not be applicable to patients at the low and high extremes of body sizes, pregnant women, and vegetarians.)  Anion Gap 9  Ammonia, Plasma  60 (Result(s) reported on 12 May 2014 at 05:06AM.)  Routine Hem:  12-Aug-15 04:11   WBC (CBC) 7.4  RBC (CBC)  3.04  Hemoglobin (CBC)  9.9  Hematocrit (CBC)  28.1  Platelet Count (CBC)  77  MCV 93  MCH 32.5  MCHC 35.1  RDW 14.4  Neutrophil % 74.5  Lymphocyte % 14.2  Monocyte % 6.8  Eosinophil % 3.8  Basophil % 0.7  Neutrophil # 5.5  Lymphocyte # 1.1  Monocyte # 0.5  Eosinophil # 0.3  Basophil # 0.0 (Result(s) reported on 12 May 2014 at 04:51AM.)   Assessment/Plan:  Assessment/Plan:  Assessment 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  Mental status stable.   Plan 1) Continue lactulose & titrate to 3-4 BMs daily 2) Continue Xifaxin $RemoveBeforeDE'550mg'[id number]$  BID 3) Follow up with Dr Vira Agar outpatient Will sign off, Please call if you have any questions or concerns   Electronic Signatures: Andria Meuse (NP)  (Signed 12-Aug-15 13:05)  Authored: Chief Complaint, VITAL SIGNS/ANCILLARY NOTES, Brief Assessment, Lab Results, Assessment/Plan   Last Updated: 12-Aug-15 13:05 by Andria Meuse (NP)

## 2015-01-22 NOTE — Consult Note (Signed)
PATIENT NAME:  Corey West, MOUNGER MR#:  [id number] DATE OF BIRTH:  [birth year]  DATE OF CONSULTATION:  05/10/2014  CONSULTING PHYSICIAN:  Lucilla Lame, MD  CONSULTING SERVICE:  Gastroenterology   REASON FOR CONSULTATION: Hepatic encephalopathy.   HISTORY OF PRESENT ILLNESS: This patient is a 64 year old gentleman who came in for confusion. The patient has a history of nonalcoholic fatty liver disease with cirrhosis, esophageal varices, and has been followed in the past by Dr. Vira Agar. The patient has had banding of his esophageal varices and has been on lactulose in the past, which he states gives him diarrhea. The patient was admitted with a change in mental status and has been put on lactulose 30 mL every 6 hours with resulting diarrhea. The patient denies any abdominal pain, nausea, vomiting, fevers, chills, or any problem at the present time.   PAST MEDICAL HISTORY: Hypertension, diabetes, nonalcoholic fatty liver disease, cirrhosis, esophageal varices, morbid obesity, chronic kidney disease.   SOCIAL HISTORY: No tobacco, alcohol or drugs.   FAMILY HISTORY: Noncontributory.   PAST SURGICAL HISTORY: Tonsillectomy.   ALLERGIES: None.   HOME MEDICATIONS: Xifaxan, spironolactone, Zocor, omeprazole, nadolol, Lasix.   REVIEW OF SYSTEMS:  A 10-point review of systems was reviewed and was negative for anything except what was stated above.   PHYSICAL EXAMINATION: VITAL SIGNS: Temperature 97.4, pulse 64, respirations 20, blood pressure 111/61, pulse oximetry 96% on room air.  GENERAL: The patient is sitting in bed in no apparent distress, is able to tell me where he is, the date, the day of the week, the year and who he is.  HEENT: Normocephalic, atraumatic. Extraocular motor intact. Pupils equally round and reactive to light and accommodation without JVD, without lymphadenopathy.  LUNGS: Clear to auscultation bilaterally.  HEART: Regular rate and rhythm without murmurs, rubs, or gallops.  ABDOMEN:  Soft, obese, nontender, nondistended, without hepatosplenomegaly.  EXTREMITIES: With lower extremity edema with dermatological stasis changes.  SKIN: Without any rashes or jaundice.  NEUROLOGICAL: Exam grossly intact.  Nonfocal, positive flapping tremor.  ANCILLARY SERVICES: Creatinine 2.5, hemoglobin 10.9, hematocrit 30.2 with WBCs of 8.5, INR of 1.2.   ASSESSMENT AND PLAN: This patient is a 64 year old gentleman with known nonalcoholic liver disease with cirrhosis. The patient is starting to have diarrhea on the lactulose and this is a risk factor for him to have worsening of his hepatic encephalopathy. Will decrease the lactulose to 15 mL every 6 hours. The patient is also being treated with Xifaxan 550 mg twice a day for his hepatic encephalopathy. The patient is alert and oriented x 3 today. Would continue current treatment. The patient should be followed for physical examination symptoms of his hepatic encephalopathy such as flapping tremor and  confusion. Following ammonia has been proven time and time again to not be reliable as an indicator for patient's worsening or improving hepatic encephalopathy. In fact, collection of this should be done after a 12 hour rest from an arterial line and kept on ice after being drawn for the test to be accurate. The patient appears to be much less confused than described when he was admitted. If he continues his improvement, then I would continue him on the Xifaxan and lactulose as an outpatient.  Thank you very much for involving me in the care of this patient. If you have any questions, please do not hesitate to call.    ____________________________ Lucilla Lame, MD dw:lb D: 05/10/2014 20:09:57 ET T: 05/10/2014 21:56:03 ET JOB#: [id number]  cc: Lucilla Lame, MD, <Dictator>  Lucilla Lame MD ELECTRONICALLY SIGNED 05/11/2014 13:24

## 2015-01-22 NOTE — Consult Note (Signed)
Brief Consult Note: Diagnosis: hematemesis.   Patient was seen by consultant.   Comments: Corey West is a very pleasant 64 y/o caucasian male with hx morbid obestiy, NAFLD cirrhosis, HE, portal HTN, esophageal varices, reflux esophagitis, & gastric polyps.  He is being treated for cellulitis through  the wound clinic & developed rigors, fever &  had a single epsiode of hematemesis at 2am.  He had an EGD 11/16 by Dr Allen Norris that showed LA GR A esophagitis, NO varices, 2 gastric polyps removed requiring clips, & multiple other gastric polyps.  EGD with Dr Allen Norris today to look for source of bleeding.  Differentials include recurrent bleeding from polyps, gastropathy oozing, esophagitis, Mallory Weiss tear, PUD or less likely varices since none seen on last EGD.  Hgb has been stable since last discharge.  I have discussed her care with Dr Evangeline Gula [facility] & our plan of care is below.  Plan: 1) Agree with protonix drip 2) NPO 3) EGD today 4) Follow H/H Thanks for allowing [place] to participate in his care.  Please see full dictated note. [id number].  Electronic Signatures: Corey West (NP)  (Signed 11-Dec-15 11:11)  Authored: Brief Consult Note   Last Updated: 11-Dec-15 11:11 by Corey West (NP)

## 2015-01-22 NOTE — Discharge Summary (Signed)
PATIENT NAME:  Corey West, Corey West MR#:  [id number] DATE OF BIRTH:  Jul 09, 1951  DATE OF ADMISSION:  08/13/2014 DATE OF DISCHARGE:  08/17/2014  DISCHARGE DIAGNOSES:  1. Upper gastrointestinal bleed with anemia from blood loss.  2. Cirrhosis with hepatic encephalopathy on treatment.  3. Weakness requiring physical therapy.  4. Type 2 diabetes.  5. Morbid obesity.   DISCHARGE MEDICATIONS: Per [facility] med reconciliation. For details on the discharge instructions, briefly, will be on his usual home medications at this point.   HOSPITAL COURSE: The patient was admitted with presumed upper GI bleed. He was put on octreotide given his history of cirrhosis and esophageal varices. His hemoglobin remained relatively stable after initial drop from 11 to around 10 range. He had no melena or bright red blood per rectum throughout the hospitalization. He was followed by GI. Eventually EGD was done which showed gastric polyps with stigmata of recent bleeding. These were removed and were sent for pathology. He has still not bled approximately 24 hours after this. He is eating well. His strength is better. He is ambulating and anxious and ready to go home.   He will be set up soon  to see Dr. Lisette Grinder, his primary doctor, as well as gastroenterology. He is recommended to have repeat EGD in approximately a month for further treatment of gastric polyps as he had further polyps present that were not clearly bleeding at the time. His mental status is doing well by the time of discharge as well. His glucose was controlled. He will stay on his insulin at home and his diabetic diet.   TIME SPENT: It took approximately 35 minutes to do discharge tasks.    ____________________________ Ocie Cornfield. Ouida Sills, MD mwa:kl D: 08/17/2014 13:48:00 ET T: 08/17/2014 15:34:37 ET JOB#: [id number]  cc: Ocie Cornfield. Ouida Sills, MD, <Dictator> Kirk Ruths MD ELECTRONICALLY SIGNED 08/18/2014 16:39

## 2015-01-22 NOTE — H&P (Signed)
PATIENT NAME:  Corey West, Corey West MR#:  [id number] DATE OF BIRTH:  [birth year]  DATE OF ADMISSION:  11/05/2013  PRIMARY CARE PHYSICIAN:  Dr. Lisette Grinder.   CHIEF COMPLAINT:  Presyncope and fall.   HISTORY OF PRESENT ILLNESS:  This is a 65 year old male who had a presyncopal episode at home as he felt dizzy and fell to the ground and was unable to get up from the floor. The patient normally works, is very active. He does have a history of nonalcoholic fatty liver disease, He has noticed that his swelling has significantly increased over the past few weeks, therefore his diuretics were increased from daily to 4 times daily. He has been taking the increased dose of diuretics, but it does not seem to be improving his swelling. This morning he felt dizzy and lightheaded and fell to the ground. He did have difficulty getting up from the floor. He was therefore brought to the ER for further evaluation. The patient was also intermittently confused at times, although is currently alert and oriented. The patient denies any fevers, chills, any abdominal pain, any nausea, vomiting, chest pain, diarrhea or any other associated symptoms presently. The patient was noted to be in acute on chronic renal failure and noted to be dehydrated. The hospitalist services were contacted for further treatment and evaluation.   REVIEW OF SYSTEMS: CONSTITUTIONAL:  No documented fever. Positive generalized weakness, positive weight gain, no weight loss.  EYES:  No blurred or double vision.  ENT:  No tinnitus, no postnasal drip. No redness of the oropharynx.  RESPIRATORY:  No cough, no wheeze, no hemoptysis, no dyspnea.  CARDIOVASCULAR:  No chest pain. No orthopnea, no palpitations, no syncope.  GASTROINTESTINAL:  No nausea. No vomiting. No diarrhea. No abdominal pain. No melena or hematochezia.  GENITOURINARY:  No dysuria or hematuria.  ENDOCRINE:  No polyuria or nocturia. No heat or cold intolerance.  HEMATOLOGIC:  No anemia, no  bruising, no bleeding.  INTEGUMENTARY:  No rashes. No lesions.  MUSCULOSKELETAL:  No arthritis. No swelling. No gout.  NEUROLOGIC:  No numbness or tingling. No ataxia. No seizure-type activity.  PSYCHIATRIC:  No anxiety, no insomnia. No ADD.   PAST MEDICAL HISTORY:  Consistent with:  1.  Nonalcoholic fatty liver disease. 2.  A history of esophageal varices.  3.  Hypertension.  4.  Morbid obesity.  5.  Obstructive sleep apnea.  6.  Diabetes.  7.  Chronic kidney disease, stage III.   ALLERGIES:  No known drug allergies.   SOCIAL HISTORY:  Used to smoke cigars, quit many years ago; also used to drink, quit 10 to 12 years ago. No illicit drug abuse. Lives at home by himself.   FAMILY HISTORY:  The patient's father had a history of heart disease and stroke. Mother had diabetes.   CURRENT MEDICATIONS:  1.  Benazepril 40 mg daily.  2.  Flexeril 5 mg t.i.d. as needed.  3.  Lasix 40 mg 4 times daily.  4.  Ativan 0.5 mg b.i.d.  5.  Nadolol 40 mg daily.  6.  NovoLog 70/30, 72 units at bedtime and 82 units in the morning.  7.  Omeprazole 40 mg b.i.d. 8.  Simvastatin 40 mg daily.  9.  Tramadol 50 mg, 1 to 3 tabs q.6 hours as needed.   PHYSICAL EXAMINATION:  VITAL SIGNS:  Temperature 96.8, pulse 62, respirations 18, blood pressure 149/47 and sats 100% on room air.  GENERAL:  A pleasant-appearing male in no apparent distress.  HEENT:  Atraumatic, normocephalic. Extraocular muscles are intact. Pupils are equal and reactive to light. Sclerae anicteric. No conjunctival injection. No pharyngeal erythema.  NECK:  Supple. There is no jugular venous distention. No bruits. No lymphadenopathy or thyromegaly.  HEART:  Regular rate and rhythm. No murmurs. No rubs or clicks.  LUNGS:  Clear to auscultation bilaterally. No rales or rhonchi. No wheezes.  ABDOMEN:  Soft, flat, distended, good bowel sounds. No hepatosplenomegaly appreciated.  EXTREMITIES:  No evidence of any cyanosis or clubbing, +2 to 3  pitting edema from the knees to the ankles bilaterally.  NEUROLOGICAL:  Alert, awake and oriented x 3 with no focal motor or sensory deficits appreciated bilaterally.  SKIN:  Moist and warm with no rashes appreciated.  LYMPHATIC:  No cervical or axillary lymphadenopathy.   LABORATORY EXAM:  Showed a serum glucose of 143, BUN 21, creatinine 2.04, sodium 139, potassium 3.9, chloride 106, bicarb 27. Troponin less than 0.02. White cell count 5.1, hemoglobin 8.5, hematocrit 26.7, platelet count of 86.   The patient did have a CT of the head done, which showed no acute intracranial process. The patient also had a chest x-ray done, which showed no evidence of acute cardiopulmonary disease.   ASSESSMENT AND PLAN:  This is a 64 year old male with a history of nonalcoholic fatty liver disease, hypertension, morbid obesity, obstructive sleep apnea, diabetes, chronic kidney disease stage III, who presents to the hospital due to a presyncopal episode and a fall.  1.  Presyncope/fall. This is likely related to some mild dehydration combined with generalized weakness due to his underlying volume overload and chronic liver disease. The patient apparently was on diuretics and they were just decreased due to worsening peripheral edema and he has been overdiuresed. For now, we will gently hydrate him with IV fluids, hold his diuretics. We will also get a Physical Therapy consult to assess his mobility. His CT head is negative. Unlikely there is a neurogenic source to his presyncopal episode.  2.  Acute on chronic renal failure. I suspect this is secondary to dehydration from overdiuresis. I will gently hydrate him with IV fluids, follow BUN and creatinine, hold diuretics for now. 3.  Diabetes. Continue with his insulin 70/30, follow blood sugars, place him on a carb-controlled diet. The patient is followed by Dr. Gabriel Carina. 3.  Hyperlipidemia. Continue simvastatin.  4.  Nonalcoholic fatty liver disease with a history of  esophageal varices and banding. I will continue with his Nadolol. The patient was supposed to have a followup endoscopy tomorrow for his esophageal varices. I will go ahead and consult Gastroenterology as it may be done while he is in the hospital now.  CODE STATUS:  The patient is a FULL CODE.   The patient will be transferred Dr. Dreama Saa service.   TIME SPENT: 50 minutes.   ____________________________ Belia Heman. Verdell Carmine, MD vjs:jm D: 11/05/2013 17:24:11 ET T: 11/05/2013 17:44:45 ET JOB#: [id number]  cc: Belia Heman. Verdell Carmine, MD, <Dictator> Henreitta Leber MD ELECTRONICALLY SIGNED 11/14/2013 17:55

## 2015-01-22 NOTE — Consult Note (Signed)
Chief Complaint:  Subjective/Chief Complaint Pt denies confusion, sleepiness.  States diarrhea improved but unsure how many stools he has had.  Tolerating diet well.   Brief Assessment:  GEN no acute distress, obese, A/Ox3, good insight.  Neice at bedside.   Cardiac Regular   Respiratory normal resp effort   Gastrointestinal details normal Bowel sounds normal  No rebound tenderness  No gaurding  Protuberant,   EXTR +massive edema bilat, erythema RLE/ankle   Lab Results: Hepatic:  11-Aug-15 04:21   Bilirubin, Total  1.2  Alkaline Phosphatase  43 (46-116 NOTE: New Reference Range 04/20/14)  SGPT (ALT) 14 (14-63 NOTE: New Reference Range 04/20/14)  SGOT (AST)  13  Total Protein, Serum  5.8  Albumin, Serum  2.2  Routine Chem:  11-Aug-15 04:21   Magnesium, Serum 2.1 (1.8-2.4 THERAPEUTIC RANGE: 4-7 mg/dL TOXIC: > 10 mg/dL  -----------------------)  Glucose, Serum  205  BUN  28  Creatinine (comp)  2.26  Sodium, Serum 139  Potassium, Serum 3.7  Chloride, Serum 106  CO2, Serum 25  Calcium (Total), Serum  8.1  Osmolality (calc) 289  eGFR (African American)  35  eGFR (Non-African American)  30 (eGFR values <2mL/min/1.73 m2 may be an indication of chronic kidney disease (CKD). Calculated eGFR is useful in patients with stable renal function. The eGFR calculation will not be reliable in acutely ill patients when serum creatinine is changing rapidly. It is not useful in  patients on dialysis. The eGFR calculation may not be applicable to patients at the low and high extremes of body sizes, pregnant women, and vegetarians.)  Anion Gap 8  Ammonia, Plasma  51 (Result(s) reported on 11 May 2014 at 05:07AM.)  Routine Hem:  11-Aug-15 04:21   WBC (CBC) 8.5  RBC (CBC)  3.18  Hemoglobin (CBC)  10.2  Hematocrit (CBC)  29.5  Platelet Count (CBC)  66  MCV 93  MCH 32.0  MCHC 34.4  RDW  14.8  Neutrophil % 76.2  Lymphocyte % 13.2  Monocyte % 8.0  Eosinophil % 2.3  Basophil %  0.3  Neutrophil # 6.5  Lymphocyte # 1.1  Monocyte # 0.7  Eosinophil # 0.2  Basophil # 0.0 (Result(s) reported on 11 May 2014 at 04:41AM.)   Assessment/Plan:  Assessment/Plan:  Assessment 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  Mental status stable.   Plan 1) Continue lactulose & titrate to 3-4 BMs daily 2) Continue Xifaxin $RemoveBeforeDE'550mg'WuAVpVHoHigUldZ$  BID 3) Follow up with Dr Vira Agar outpatient Please call if you have any questions or concerns   Electronic Signatures: Andria Meuse (NP)  (Signed 11-Aug-15 12:19)  Authored: Chief Complaint, Brief Assessment, Lab Results, Assessment/Plan   Last Updated: 11-Aug-15 12:19 by Andria Meuse (NP)

## 2015-01-22 NOTE — Consult Note (Signed)
PATIENT NAME:  Corey West, Corey West MR#:  [id number] DATE OF BIRTH:  [birth year]  DATE OF CONSULTATION:  08/14/2014  REFERRING PHYSICIAN:   CONSULTING PHYSICIAN:  Lucilla Lame, MD  CONSULTING SERVICE: Gastroenterology.   REASON FOR CONSULTATION: Upper gastrointestinal bleed.   HISTORY OF PRESENT ILLNESS: This patient is a 64 year old gentleman with known nonalcoholic cirrhosis. The patient has a long history of nonalcoholic fatty liver disease and has been treated for variceal bleeding as far back as 4 years ago. The patient has been a patient of Dr. Percell Boston for some time, and states that he had been doing well up until he had hematemesis x 1 yesterday morning. The patient also reports that he was passing some black and red stools. The patient has not had any further symptoms since then. He also reports that, when he vomited, IT was red material mixed with brown material. There is no report of any abdominal pain, nausea, vomiting, fevers or chills since admission. He also reports that in-between the last 4 years since his last GI bleed, he has not had any signs of GI bleeding. He does have a history of hepatic encephalopathy and I had seen him in the past for an admission for that. The patient is now on lactulose and Xifaxan for that. The patient's hemoglobin on admission was 11.2, with it being down to 9.9 today.   PAST MEDICAL HISTORY: Nonalcoholic fatty liver disease with cirrhosis, esophageal varices with banding in 2011, GERD with Barrett esophagus, history of hepatic encephalopathy, hypertension, diabetes, hyperlipidemia, chronic obstructive pulmonary disease, morbid obesity, chronic kidney disease, iron deficiency anemia, pancytopenia.   SOCIAL HISTORY: No alcohol, tobacco or drug abuse.   FAMILY HISTORY: Noncontributory for his GI symptoms.   ALLERGIES: No known drug allergies.   HOME MEDICATIONS: Xifaxan, spironolactone, simvastatin, pantoprazole, oxycodone, omeprazole, NovoLog, Nadolol,  lorazepam, lactulose, furosemide, ferrous sulfate, clindamycin, Aleve.   REVIEW OF SYSTEMS: A 10-point review of systems was negative, except for what was stated above.   PHYSICAL EXAMINATION:  GENERAL: The patient is sitting in bed, in no apparent distress.  VITAL SIGNS: Temperature 97.9, pulse 56, respirations 18, blood pressure 157/78, pulse oximetry 97%.  HEENT: Normocephalic, atraumatic. Extraocular movements intact. Pupils equally round and reactive to light and accommodation.  NECK: Without JVD.  LYMPHATIC: Without lymphadenopathy.  LUNGS: Clear to auscultation bilaterally on anterior exam.  HEART: Regular rate and rhythm, without murmurs, rubs, or gallops.  ABDOMEN: Soft. Nontender. Positive distention. Positive obesity. Without rebound or guarding.  EXTREMITIES: Without cyanosis, clubbing, or edema.  MUSCULOSKELETAL: Good range of motion.   LABORATORY FINDINGS: As stated above.   ASSESSMENT AND PLAN: This patient is a 64 year old gentleman with known cirrhosis, who now comes in with hematemesis. The patient has had no further bleeding or vomiting since being admitted. The patient's hemoglobin has dropped. The patient does not appear encephalopathic, and repeating his ammonia levels with normal mentation is of no clinical value. The patient's history of variceal bleeding makes him a likely candidate for repeat esophageal variceal bleeding. In light of that, the patient does report taking Aleve, which he has been told can be detrimental to his kidneys and lead to hepatorenal syndrome. He has been told to avoid anti-inflammatory medications in the future.   If the patient has any acute active bleeding during the weekend, he may need an urgent or emergent upper gastrointestinal endoscopy with banding. If not, he will be put on the schedule for Monday morning for treatment of any varices that may have  been the source of bleeding from this admission.   Thank you very much for involving me in  the care of this patient. If you have any questions, please do not hesitate to call.     ____________________________ Lucilla Lame, MD dw:MT D: 08/14/2014 16:21:54 ET T: 08/14/2014 16:34:34 ET JOB#: [id number]  cc: Lucilla Lame, MD, <Dictator> Lucilla Lame MD ELECTRONICALLY SIGNED 08/15/2014 8:05

## 2015-01-22 NOTE — Discharge Summary (Signed)
PATIENT NAME:  Corey West, Corey West MR#:  [id number] DATE OF BIRTH:  1950-12-01  DATE OF ADMISSION:  05/09/2014 DATE OF DISCHARGE:  05/14/2014  REASON FOR ADMISSION: Hepatic encephalopathy with altered mental status.   HISTORY OF PRESENT ILLNESS: The patient is a 64 year old male followed by Dr. Lisette Grinder with a significant history of cirrhosis and recurrent hepatic encephalopathy. He has esophageal varices. Presents to the Emergency Room with a 3 day history of confusion and weakness. In the Emergency Room, the patient was noted to have elevated ammonia and was admitted for further evaluation.   PAST MEDICAL HISTORY: 1.  Non-alcoholic fatty liver disease.  2.  Obesity.  3.  Cirrhosis.  4.  Esophageal varices. 5.  Obstructive sleep apnea.  6.  Stage III chronic kidney disease.  7.  Type 2 diabetes.  8.  Benign hypertension.   MEDICATIONS ON ADMISSION: Please see admission note.   ALLERGIES: No known drug allergies.   SOCIAL HISTORY: Negative for alcohol or tobacco abuse.   FAMILY HISTORY: Positive for stroke and coronary artery disease and diabetes.   REVIEW OF SYSTEMS: As per HPI.  PHYSICAL EXAMINATION: The patient was in no acute distress. Vital signs were stable and he was afebrile, although he was mildly confused. HEENT exam was unremarkable. Neck was supple without JVD. Lungs were clear. Cardiac exam revealed a regular rate and rhythm with normal S1 and S2. Abdomen was soft, obese, mildly distended. No obvious organomegaly. Extremities revealed 3+ edema. Neurologic exam was grossly nonfocal, other than the patient's mental status.   HOSPITAL COURSE: The patient was admitted with hepatic encephalopathy with acute on chronic kidney disease. He was noted to have massive lower extremity edema with possible cellulitis and was placed on antibiotics. He was seen by GI who adjusted his lactulose and Xifaxan. The patient was stooling 3 to 4 times daily with improvement of his mental status. His  pneumonia improved as well. He was seen by physical therapy who recommended home health. Sugars remained stable. He was seen by vascular surgery for his lymphedema. Outpatient therapy was recommended. The patient is stable now and ready for discharge.   DISCHARGE DIAGNOSES: 1.  Hepatic encephalopathy, resolved.  2.  Stage III chronic kidney disease.  3.  Lymphedema with lower extremity cellulitis.  4.  Chronic thrombocytopenia.  5.  Esophageal varices.  6.  Morbid obesity.  7.  Benign hypertension.  8.  Type 2 diabetes.  9.  Obstructive sleep apnea.   DISCHARGE MEDICATIONS: 1.  Lactulose 15 mL p.o. q. 6 hours.  2.  NovoLog 70/30, 31 units subcutaneous every p.m. and 41 units subcutaneous every a.m.  3.  Zocor 40 mg p.o. at bedtime.  4.  Xifaxan 550 mg p.o. b.i.d.  5.  Aldactone 25 mg p.o. daily.  6.  Nadolol 40 mg p.o. daily.  7.  Lasix 40 mg p.o. daily.  8.  Ceftin 250 mg p.o. b.i.d.  9.  Protonix 40 mg p.o. daily.   FOLLOW-UP PLANS AND APPOINTMENTS: The patient will be discharged on a 2 gram sodium diet. He will be followed by home health. He will follow up with Dr. Vira Agar and Dr. Gilford Rile in the office next week. We will need to arrange for him to see vascular surgery for his lymphedema once he establishes back with Dr. Gilford Rile.   ____________________________ Leonie Douglas Doy Hutching, MD jds:sb D: 05/14/2014 07:30:16 ET T: 05/14/2014 11:09:12 ET JOB#: [id number]  cc: Leonie Douglas. Doy Hutching, MD, <Dictator> Corey Alper Lennice Sites MD ELECTRONICALLY SIGNED  05/16/2014 21:07 

## 2015-01-22 NOTE — Consult Note (Signed)
Chief Complaint:  Subjective/Chief Complaint Patient without any sign of further bleeding. No complaints except for problems with bowel movements with trying to get out of bed.Hb Stable   VITAL SIGNS/ANCILLARY NOTES: **Vital Signs.:   12-Dec-15 07:30  Vital Signs Type Routine  Temperature Temperature (F) 98  Celsius 36.6  Temperature Source oral  Pulse Pulse 55  Respirations Respirations 18  Systolic BP Systolic BP 97  Diastolic BP (mmHg) Diastolic BP (mmHg) 43  Mean BP 61  Pulse Ox % Pulse Ox % 97  Pulse Ox Activity Level  At rest  Oxygen Delivery Room Air/ 21 %   Brief Assessment:  GEN well developed, well nourished   Respiratory no use of accessory muscles   Additional Physical Exam Alert and orientated times 3   Lab Results: Hepatic:  12-Dec-15 04:40   Bilirubin, Total 1.0  Alkaline Phosphatase 52 (46-116 NOTE: New Reference Range 04/20/14)  SGPT (ALT) 18 (14-63 NOTE: New Reference Range 04/20/14)  SGOT (AST) 18  Total Protein, Serum 6.4  Albumin, Serum  2.4  Routine Chem:  12-Dec-15 04:40   Glucose, Serum  147  BUN  19  Creatinine (comp)  2.26  Sodium, Serum 138  Potassium, Serum 3.8  Chloride, Serum  108  CO2, Serum 25  Calcium (Total), Serum  7.9  Osmolality (calc) 281  eGFR (African American)  38  eGFR (Non-African American)  31 (eGFR values <58m/min/1.73 m2 may be an indication of chronic kidney disease (CKD). Calculated eGFR, using the MRDR Study equation, is useful in  patients with stable renal function. The eGFR calculation will not be reliable in acutely ill patients when serum creatinine is changing rapidly. It is not useful in patients on dialysis. The eGFR calculation may not be applicable to patients at the low and high extremes of body sizes, pregnant women, and vegetarians.)  Anion Gap  5  Routine Hem:  12-Dec-15 04:40   WBC (CBC) 10.6  RBC (CBC)  3.15  Hemoglobin (CBC)  9.9  Hematocrit (CBC)  29.2  Platelet Count (CBC)  91   MCV 93  MCH 31.4  MCHC 33.9  RDW  14.6  Neutrophil % 79.5  Lymphocyte % 11.0  Monocyte % 6.7  Eosinophil % 2.3  Basophil % 0.5  Neutrophil #  8.4  Lymphocyte # 1.2  Monocyte # 0.7  Eosinophil # 0.2  Basophil # 0.1 (Result(s) reported on 11 Sep 2014 at 05:54AM.)   Assessment/Plan:  Assessment/Plan:  Assessment Upper GI bleed.   Plan Patient without any sign of further bleeding. Likely Mallory weiss tear with vomiting from infection of leg. Will hold Lactulose due to bathroom issues but patient should resume it at home.   Electronic Signatures: WLucilla Lame(MD)  (Signed 12-Dec-15 10:37)  Authored: Chief Complaint, VITAL SIGNS/ANCILLARY NOTES, Brief Assessment, Lab Results, Assessment/Plan   Last Updated: 12-Dec-15 10:37 by WLucilla Lame(MD)

## 2015-01-22 NOTE — Consult Note (Signed)
Brief Consult Note: Diagnosis: presyncope/fall.   Patient was seen by consultant.   Consult note dictated.   Comments: Appreciate consult for 64 y/o caucasian man with history of NAFLD cirrhosis, for evaluation as to EGD for reassessment of varices. Patient is difficult historian: disoriented at times. Niece tries to help, however does not live with him. Evidentally he had EGD due to UGI bleeding in 2011, which found some erroded esophageal varices. The procedure was repeated and they were banded the next day. Has been on nadolol 40mg  po daily since. Really has no GI complaints presently, with the exception of intermittent abdominal swelling and lower extremity swelling. He has not been taking his lasix as directed and does not follow a 2gm Na diet. By his report, consumes a fair bit of fast food. It appears he has  had difficulty with medication and dietary comprehension and adherence.  He was admitted yesterday with fall/presyncope/and has been feeling weak. Niece reports that he has been somewhat confused and disoriented. Did have NH assessment at [facility] last december with results of 224. Was started on lactulose, but he did not continue this. Not taking any xifaxan. No NH4, pt/inr,  or hepatic panel as of yet. hgb stable at 8.  Impression and plan: prior to repeating EGD, would reassess NH4 and treat for HE. 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  Had lengthy discussion with patient/family about cirrhosis: varices/ascites/HE/methods to control and follow up care. Evidentally takes nadolol in am: have advised him to take it around 5p for better efficacy Of note: liver testing with recent negative HCV, HBV surface/core ab and antigen, HAV IgM. Negative AMA, ASMA, serum iron..  Electronic Signatures: Stephens November H (NP)  (Signed 06-Feb-15 19:11)  Authored: Brief Consult Note   Last Updated: 06-Feb-15 19:11 by Theodore Demark (NP)

## 2015-01-22 NOTE — Consult Note (Signed)
Chief Complaint:  Subjective/Chief Complaint Patient with vomiting blood and NAFLD. The patient has not had any further bleeding. Also had rectal bleeding around the same time as the vomiting. No vomiting since admission.   VITAL SIGNS/ANCILLARY NOTES: **Vital Signs.:   14-Nov-15 08:21  Vital Signs Type Q 8hr  Temperature Temperature (F) 98.5  Celsius 36.9  Temperature Source oral  Pulse Pulse 52  Respirations Respirations 18  Systolic BP Systolic BP 364  Diastolic BP (mmHg) Diastolic BP (mmHg) 68  Mean BP 87  Pulse Ox % Pulse Ox % 97  Pulse Ox Activity Level  At rest  Oxygen Delivery Room Air/ 21 %   Brief Assessment:  GEN well developed, well nourished, obese   Cardiac Regular   Respiratory normal resp effort  clear BS  no use of accessory muscles   Gastrointestinal details normal Soft   Additional Physical Exam Alert and orientated times 3   Lab Results: Routine Chem:  14-Nov-15 04:52   Ammonia, Plasma  54 (Result(s) reported on 14 Aug 2014 at [facility]  Glucose, Serum  161  BUN  26  Creatinine (comp)  2.32  Sodium, Serum 143  Potassium, Serum 4.3  Chloride, Serum  109  CO2, Serum 26  Calcium (Total), Serum  8.3  Anion Gap 8  Osmolality (calc) 293  eGFR (African American)  37  eGFR (Non-African American)  30 (eGFR values <77m/min/1.73 m2 may be an indication of chronic kidney disease (CKD). Calculated eGFR, using the MRDR Study equation, is useful in  patients with stable renal function. The eGFR calculation will not be reliable in acutely ill patients when serum creatinine is changing rapidly. It is not useful in patients on dialysis. The eGFR calculation may not be applicable to patients at the low and high extremes of body sizes, pregnant women, and vegetarians.)  Routine Hem:  14-Nov-15 04:52   WBC (CBC) 9.8  RBC (CBC)  3.09  Hemoglobin (CBC)  9.9  Hematocrit (CBC)  28.6  Platelet Count (CBC)  60  MCV 93  MCH 32.1  MCHC 34.6  RDW  14.6   Neutrophil % 82.1  Lymphocyte % 9.9  Monocyte % 5.9  Eosinophil % 1.7  Basophil % 0.4  Neutrophil #  8.1  Lymphocyte # 1.0  Monocyte # 0.6  Eosinophil # 0.2  Basophil # 0.0 (Result(s) reported on 14 Aug 2014 at 05:28AM.)   Assessment/Plan:  Assessment/Plan:  Assessment Upper GI bleed in a patient with a history of variceal bleeding and cirrhosis.   Plan The patient has no sign of any active bleeding now. Will follow and continue Octreotide. If acute bleeding then will scope urgently. If no further bleeding then plan for EGD on Monday.   Electronic Signatures: WLucilla Lame(MD)  (Signed [id number])  Authored: Chief Complaint, VITAL SIGNS/ANCILLARY NOTES, Brief Assessment, Lab Results, Assessment/Plan   Last Updated: 14-Nov-15 13:11 by WLucilla Lame(MD)

## 2015-01-22 NOTE — H&P (Signed)
PATIENT NAME:  Corey West, Corey West MR#:  [id number] DATE OF BIRTH:  1951/09/08  DATE OF ADMISSION:  09/10/2014  REFERRING DOCTOR: Yetta Numbers. Karma Greaser, MD   PRIMARY CARE DOCTOR: Macoy B. Sarina Ser, MD, of [facility].   ADMITTING DOCTOR: Juluis Mire, MD  CHIEF COMPLAINTS:  1.  Bilateral lower extremity swelling with redness and pain.  2.  Fever of 103 degrees Fahrenheit.  3.  Vomiting of blood of 1 day duration.    HISTORY OF PRESENT ILLNESS: A 64 year old Caucasian male with a past medical history of multiple medical problems including cirrhosis and known esophageal varices status post EGD done in November 2015, cirrhosis of liver,  portal hypertension,  splenomegaly, gastroesophageal reflux disease, history of hepatitic encephalopathy, hypertension, diabetes mellitus type 2 on insulin, hyperlipidemia, obstructive sleep apnea on CPAP, morbid obesity, chronic kidney disease stage III, chronic iron deficiency anemia and pancytopenia, chronic lower extremity edema presents to the Emergency Room with the complaints of ongoing swelling, redness, and pain of bilateral lower extremities for the past few days. The patient subsequently developed a fever of 102 degrees Fahrenheit, hence decided to come to the Emergency Room for further evaluation. The patient also gives a history of vomiting with some bright red blood of 1 day duration. Denies any abdominal pain. No cough. No chest pain. No dizziness. No loss of consciousness. He does have some nausea but no diarrhea. No urinary problems. In the Emergency Room, the patient was evaluated by the ED physician and was noted to have a mildly elevated white blood cell count and hemoglobin and hematocrit were  stable and CKD with creatinine of 3.0. Hence was diagnosed with the bilateral lower extremity cellulitis, was started on IV antibiotics of vancomycin and Zosyn after blood cultures. Regarding his hematemesis, he did not have any further episodes of vomiting or  hematemesis while in the Emergency Room. The patient remained hemodynamically stable and was given Protonix drip and currently stable. Hospitalist service was consulted for further evaluation and management. The patient he is comfortably lying in the bed at this time. States that he is feeling slightly better now and denies any further vomiting or hematemesis.    PAST MEDICAL HISTORY:  1.  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  2.  Esophageal varices,  with recent EGD done in November 2015.  3.  Portal hypertension and splenomegaly.  4.  Gastroesophageal reflux disease with varices of esophagus.  5.  History of hepatic encephalopathy.  6.  Hypertension.  7.  Diabetes mellitus type 2 on insulin.  8.  Hyperlipidemia.  9.  Obstructive sleep apnea on CPAP machine.  10.  Morbid obesity.  11.  Chronic kidney disease, stage III.  12.  Chronic iron deficiency anemia and pancytopenia.  13.  Chronic bilateral lower extremity edema.   PAST SURGICAL HISTORY:  1.  Right ulnar nerve release.  2.  Tonsillectomy and adenoidectomy due to sleep apnea.    ALLERGIES: No known drug allergies.   HOME MEDICATIONS:  1.  Xifaxan 550 mg 1 tablet twice a day.  2.  Spironolactone 25 mg tablet 1 tablet once a day.  3.  Simvastatin 40 mg tablet 1 tablet orally once a day.  4.  Pantoprazole 40 mg tablet 1 tablet orally once a day.  5.  Oxycodone 5 mg tablet 1 tablet every 6 hours as needed for pain.  6.  Omeprazole 40 mg capsule 1 capsule orally once a day.  7.  NovoLog  Mix 70/30 at 82 units the morning and 60 units in the evening.  8.  Nadolol 40 mg 1 tablet orally once a day.  9.  Lorazepam 0.5 mg 1 tablet every 8 hours as needed for anxiety.  10.  Lactulose 10 grams per 15 mL oral syrup 30 mL orally every 8 hours.  11.  Theophylline 40 mg tablet 1 tablet 2 times a day.  12.  Ferrous sulfate 325 mg 1 tablet orally once a day.  13.  Clindamycin 300 mg 1 capsule every 6 hours.  14.   Aleve sodium as needed, but he is not taking this medication.   SOCIAL HISTORY: He currently lives alone. Uses a cane for ambulation. No history of smoking, alcohol, or illicit drug usage.   FAMILY HISTORY: Positive for stroke, coronary artery disease, and diabetes    REVIEW OF SYSTEMS:   CONSTITUTIONAL: Positive for fever of 102 degrees Fahrenheit. Fatigue present  EYES: Negative for blurred vision, double vision. No pain. No redness. No inflammation.  EARS, NOSE, AND THROAT: Negative for tinnitus, ear pain, hearing loss, epistaxis, nasal discharge, difficulty swallowing.  RESPIRATORY: Negative for cough, wheezing, hemoptysis, painful respirations,  CARDIOVASCULAR: Negative for chest pain, palpitations, syncope, or dizziness.  GASTROINTESTINAL: Positive for nausea and vomiting and hematemesis of 1 day duration. No diarrhea.  GENITOURINARY: Negative for dysuria, hematuria, frequency or urgency.  ENDOCRINE: Negative for polyuria, polydipsia. No heat or cold intolerance.  HEMATOLOGIC: Chronic anemia present. No easy bruising.  MUSCULOSKELETAL: Negative for arthritis, joint swelling or pain.  NEUROLOGICAL: Negative for focal weakness or numbness. No CVA, TIA, or seizure disorder.  PSYCHIATRIC: Negative for anxiety, insomnia, or depression.   PHYSICAL EXAMINATION:  VITAL SIGNS: Temperature 102.3 degrees Fahrenheit, pulse rate 80 beats per minute, respirations 19 per minute, blood pressure 127/55, oxygen saturation 97% on room air.  GENERAL: Well-nourished, obese person not in acute distress, alert and oriented, comfortably lying in the bed.  HEAD: Atraumatic, normocephalic.  EYES: Pupils are equal, reactive to light. No conjunctival pallor. No scleral icterus. Extraocular movements intact.  NOSE: No nasal lesions. No drainage.  EARS: No drainage. No external lesions.  ORAL CAVITY: No mucosal lesions. No exudates.  NECK: Supple. No JVD. No thyromegaly. No carotid bruit. Range of motion normal.   RESPIRATORY: Good respiratory effort. Not using accessory muscles of respiration. Bilateral vesicular breath sounds present. No rales or rhonchi.  CARDIOVASCULAR: S1, S2 regular. No murmurs, gallops, or clicks appreciated. Peripheral pulses poorly felt because of bilateral lower extremity edema. Peripheral edema, chronic pedal edema bilaterally present.  GASTROINTESTINAL: Soft, obese, nontender. Hepatosplenomegaly not appreciated because of obesity. Bowel sounds present and equal in all 4 quadrants.  GENITOURINARY: Deferred.  MUSCULOSKELETAL: Gait not tested. Range of motion adequate. Strength and tone equal bilaterally in both upper and lower extremities.  SKIN: Bilateral lower extremity edema with redness and tenderness up to the knees present.  LYMPHATIC: No cervical lymphadenopathy.  VASCULAR: Poor dorsalis pedis and posterior tibial pulses because of chronic bilateral pedal edema.  NEUROLOGICAL: Cranial nerves II-XII are totally grossly intact. DTRs are 2+ bilaterally and symmetrical. Motor strength is 4/4 in both upper and lower extremities.   PSYCHIATRIC: Judgment and insight are adequate. Alert and oriented x 3. Memory and mood within normal limits.   LABORATORY DATA: Serum glucose 123, BUN 18, creatinine 2.08, sodium 139, potassium 3.7, chloride 106, bicarbonate 26, total calcium 8.7, lipase 146, total protein 6.9, albumin 2.8, total bilirubin 0.9, alkaline phosphatase 61, AST 27, ALT 24. WBC  11.1, hemoglobin 10.1, hematocrit 29.9, platelet count 102,000. Urinalysis 30 mg protein otherwise unremarkable.   CHEST X-RAY: No active disease.   ASSESSMENT AND PLAN: A 64 year old Caucasian male with a history of multiple medical problems presents with the complaints of bilateral lower extremity swelling, redness, and pain with a temperature of 102 degrees Fahrenheit and an episode of vomiting with hematemesis.  1.  Bilateral lower extremity cellulitis, left more than the right: History of chronic  bilateral pedal edema present. PLAN: Admit to medical floor, blood cultures, intravenous antibiotics vancomycin and Zosyn, follow up CBC, and bilateral leg elevation.  2.  Hematemesis in a patient with a history of cirrhosis of liver, status post EGD in November 2015  which is positive for esophageal varices. The patient is stable hemodynamically. Hemoglobin and hematocrit are stable clinically. PLAN: Monitor serial hemoglobin and hematocrit. Type and crossmatch. Keep the patient n.p.o. except for medication. Continue Protonix drip and gastrointestinal consultation.  3.  History of cirrhosis, portal hypertension, known to gastrointestinal, stable clinically. LFTs normal. Check ammonia levels. Monitor LFTs.  4.  Diabetes mellitus on insulin. We will hold home insulin at this time. Continue sliding scale insulin. Monitor blood sugars.  5.  Hypertension, stable on home medications. Continue same.  6.  History of obstructive sleep apnea on CPAP, stable clinically. Continue same.  7.  Chronic kidney disease, stable clinically, creatinine mildly increased but stable. Continue gentle intravenous hydration. Monitor BMP, avoid nephrotoxic agents.  8.  Chronic bilateral lower extremity edema with cellulitis. PLAN: Elevation and intravenous antibiotics as mentioned above.  9.  Deep vein thrombosis prophylaxis: Sequential compression devices. No heparin because of active gastrointestinal bleed.  10.  Gastrointestinal prophylaxis: Protonix.   CODE STATUS: Full code.   TIME SPENT: 55 minutes.    ____________________________ Juluis Mire, MD enr:bm D: 09/10/2014 06:53:06 ET T: 09/10/2014 07:09:43 ET JOB#: [id number]  cc: Juluis Mire, MD, <Dictator> Nawaf B. Sarina Ser, MD Juluis Mire MD ELECTRONICALLY SIGNED 09/10/2014 19:03

## 2015-01-22 NOTE — Consult Note (Signed)
Brief Consult Note: Diagnosis: Patient with NAFLD and HE. Admitted with confusion. Now alert to place, time, person and date. No compalints except some diarrhea.   Comments: Patient was admitted with HE. Now Alert and orientated times 3. Doe shave a slight flapping tremor. Would not follow ammonia but patients exam. Ammonia would need to taken after 12 hours of rest, from arterial blood and then frozen after being drawn.  Will decrease lactulose due to diarrhea to 15mg  in stead of 30 and will start him on Xifaxin 550mg  BID.  Electronic Signatures: Lucilla Lame (MD)  (Signed 10-Aug-15 12:51)  Authored: Brief Consult Note   Last Updated: 10-Aug-15 12:51 by Lucilla Lame (MD)

## 2015-01-24 LAB — SURGICAL PATHOLOGY

## 2015-01-26 NOTE — Discharge Summary (Signed)
PATIENT NAME:  Corey West, Corey West MR#:  [id number] DATE OF BIRTH:  11-03-1950  DATE OF ADMISSION:  09/10/2014 DATE OF DISCHARGE:  09/14/2014  PRIMARY CARE PHYSICIAN:  Kaesyn B. Sarina Ser, MD  CONSULTING GASTROENTEROLOGIST:  Lucilla Lame, MD   DISCHARGE DIAGNOSES: 1.  Upper gastrointestinal bleed presumably due to a Mallory-Weiss tear.  2.  Bilateral lower extremity cellulitis, left greater than right.  3.  Chronic kidney disease.  4.  Cirrhosis due to nonalcoholic steatohepatitis.  5.  Insulin-dependent diabetes.   HISTORY OF PRESENT ILLNESS:  Please see initial history and physical from December 11 for details. Briefly, this is a 63 year old male with history of multiple medical problems including cirrhosis, known esophageal varices, chronic portal hypertension, splenomegaly, diabetes, OSA, morbid obesity, chronic kidney disease, and chronic bilateral lower extremity edema, who was admitted on December 11 with increasing swelling, redness, and pain of the bilateral lower extremities. He was also found to have a fever of 102. The patient also had bright red blood in his emesis over the last 1 day. He had not had any abdominal pain.   HOSPITAL COURSE BY ISSUE:  1.  The patient was admitted and had evaluation for his hematemesis by Dr. Lucilla Lame. He had an EGD done, which showed multiple gastric polyps with normal duodenum. There were no signs of active bleeding. It was felt he likely had a Mallory-Weiss tear. His hemoglobin remained stable, and on the day of discharge it was 9.7. He will be followed up closely with GI as well as with primary care.  2.  Lower extremity cellulitis. This was his main issue and was the cause of his high fevers and his admission. He was treated initially with vancomycin and Zosyn and elevation. He was also treated with diuresis. Clinically, he had very slow improvement. By the day of discharge, his right lower extremity was markedly improved with minimal erythema. He did have  chronic venous stasis changes. The left lower extremity had also improved remarkably but was still erythematous. He was seen by wound care and will have an Unna boot placed. He will be re-evaluated by infectious disease in 3 days in the outpatient setting. He will be discharged on doxycycline and Keflex. He will likely need a repeat Unna boot placed at his followup. Apparently, he has been evaluated by vein and vascular in the past.  3.  Chronic kidney disease. His renal function remained stable despite diuresis, and at discharge, his creatinine was 2.05, down from 2.26.  4.  Cirrhosis. This remained stable. He had his lactulose held for some increasing diarrhea, but that will be restarted as an outpatient. He remained on nadolol and spironolactone.   DISCHARGE MEDICATIONS:  Please see [facility] physician discharge summary. New antibiotics included Keflex 500 mg 4 times a day and doxycycline 100 mg twice a day. Other medications include omeprazole, spironolactone, lactulose, oxycodone,, NovoLog, Ativan, Lasix, nadolol, simvastatin.   DISCHARGE FOLLOWUP:  The patient will follow up with Dr. Gilford Rile within 2 weeks and with Dr. Ola Spurr in 4 days for evaluation of the lower extremity edema.   DISCHARGE CODE STATUS:  Full code.   DISCHARGE TIME:  This discharge took 40 minutes.     ____________________________ Cheral Marker. Ola Spurr, MD dpf:nb D: 09/15/2014 16:40:15 ET T: 09/16/2014 02:03:06 ET JOB#: [id number]  cc: Cheral Marker. Ola Spurr, MD, <Dictator> Tanor Glaspy Ola Spurr MD ELECTRONICALLY SIGNED 10/03/2014 21:22

## 2015-02-08 ENCOUNTER — Encounter: Payer: Self-pay | Admitting: Emergency Medicine

## 2015-02-08 ENCOUNTER — Emergency Department: Payer: BLUE CROSS/BLUE SHIELD

## 2015-02-08 ENCOUNTER — Inpatient Hospital Stay
Admission: EM | Admit: 2015-02-08 | Discharge: 2015-02-11 | DRG: 378 | Disposition: A | Discharge status: Home / Self Care | Payer: BLUE CROSS/BLUE SHIELD | Attending: Internal Medicine | Admitting: Internal Medicine

## 2015-02-08 DIAGNOSIS — R600 Localized edema: Secondary | ICD-10-CM | POA: Diagnosis present

## 2015-02-08 DIAGNOSIS — R001 Bradycardia, unspecified: Secondary | ICD-10-CM | POA: Diagnosis present

## 2015-02-08 DIAGNOSIS — E669 Obesity, unspecified: Secondary | ICD-10-CM | POA: Diagnosis present

## 2015-02-08 DIAGNOSIS — K7460 Unspecified cirrhosis of liver: Secondary | ICD-10-CM | POA: Diagnosis present

## 2015-02-08 DIAGNOSIS — Z833 Family history of diabetes mellitus: Secondary | ICD-10-CM

## 2015-02-08 DIAGNOSIS — E119 Type 2 diabetes mellitus without complications: Secondary | ICD-10-CM | POA: Diagnosis present

## 2015-02-08 DIAGNOSIS — E785 Hyperlipidemia, unspecified: Secondary | ICD-10-CM | POA: Diagnosis present

## 2015-02-08 DIAGNOSIS — F17210 Nicotine dependence, cigarettes, uncomplicated: Secondary | ICD-10-CM | POA: Diagnosis present

## 2015-02-08 DIAGNOSIS — Z8719 Personal history of other diseases of the digestive system: Secondary | ICD-10-CM | POA: Diagnosis not present

## 2015-02-08 DIAGNOSIS — Z79899 Other long term (current) drug therapy: Secondary | ICD-10-CM | POA: Diagnosis not present

## 2015-02-08 DIAGNOSIS — I8500 Esophageal varices without bleeding: Secondary | ICD-10-CM | POA: Diagnosis present

## 2015-02-08 DIAGNOSIS — N183 Chronic kidney disease, stage 3 (moderate): Secondary | ICD-10-CM | POA: Diagnosis present

## 2015-02-08 DIAGNOSIS — K219 Gastro-esophageal reflux disease without esophagitis: Secondary | ICD-10-CM | POA: Diagnosis present

## 2015-02-08 DIAGNOSIS — K920 Hematemesis: Secondary | ICD-10-CM | POA: Diagnosis present

## 2015-02-08 DIAGNOSIS — Z794 Long term (current) use of insulin: Secondary | ICD-10-CM

## 2015-02-08 DIAGNOSIS — K210 Gastro-esophageal reflux disease with esophagitis: Secondary | ICD-10-CM | POA: Diagnosis present

## 2015-02-08 DIAGNOSIS — Z6841 Body Mass Index (BMI) 40.0 and over, adult: Secondary | ICD-10-CM

## 2015-02-08 DIAGNOSIS — K922 Gastrointestinal hemorrhage, unspecified: Secondary | ICD-10-CM

## 2015-02-08 DIAGNOSIS — D62 Acute posthemorrhagic anemia: Secondary | ICD-10-CM | POA: Diagnosis present

## 2015-02-08 DIAGNOSIS — K317 Polyp of stomach and duodenum: Secondary | ICD-10-CM | POA: Diagnosis present

## 2015-02-08 DIAGNOSIS — K250 Acute gastric ulcer with hemorrhage: Secondary | ICD-10-CM | POA: Diagnosis present

## 2015-02-08 HISTORY — DX: Unspecified cirrhosis of liver: K74.60

## 2015-02-08 LAB — COMPREHENSIVE METABOLIC PANEL
ALT: 17 U/L (ref 17–63)
AST: 25 U/L (ref 15–41)
Albumin: 3.4 g/dL — ABNORMAL LOW (ref 3.5–5.0)
Alkaline Phosphatase: 54 U/L (ref 38–126)
Anion gap: 10 (ref 5–15)
BILIRUBIN TOTAL: 1.3 mg/dL — AB (ref 0.3–1.2)
BUN: 24 mg/dL — AB (ref 6–20)
CO2: 28 mmol/L (ref 22–32)
Calcium: 10.1 mg/dL (ref 8.9–10.3)
Chloride: 95 mmol/L — ABNORMAL LOW (ref 101–111)
Creatinine, Ser: 2.37 mg/dL — ABNORMAL HIGH (ref 0.61–1.24)
GFR, EST AFRICAN AMERICAN: 32 mL/min — AB (ref >60)
GFR, EST NON AFRICAN AMERICAN: 28 mL/min — AB (ref >60)
GLUCOSE: 138 mg/dL — AB (ref 65–99)
POTASSIUM: 3.4 mmol/L — AB (ref 3.5–5.1)
SODIUM: 133 mmol/L — AB (ref 135–145)
Total Protein: 7.6 g/dL (ref 6.5–8.1)

## 2015-02-08 LAB — PROTIME-INR
INR: 1.23
Prothrombin Time: 15.7 seconds — ABNORMAL HIGH (ref 11.4–15.0)

## 2015-02-08 LAB — CBC WITH DIFFERENTIAL/PLATELET
BASOS ABS: 0.1 10*3/uL (ref 0–0.1)
BASOS PCT: 0 %
EOS PCT: 0 %
Eosinophils Absolute: 0.1 10*3/uL (ref 0–0.7)
HCT: 34.2 % — ABNORMAL LOW (ref 40.0–52.0)
Hemoglobin: 11.8 g/dL — ABNORMAL LOW (ref 13.0–18.0)
LYMPHS ABS: 0.6 10*3/uL — AB (ref 1.0–3.6)
LYMPHS PCT: 4 %
MCH: 31.3 pg (ref 26.0–34.0)
MCHC: 34.4 g/dL (ref 32.0–36.0)
MCV: 90.9 fL (ref 80.0–100.0)
MONO ABS: 0.4 10*3/uL (ref 0.2–1.0)
Monocytes Relative: 3 %
NEUTROS ABS: 14.3 10*3/uL — AB (ref 1.4–6.5)
Neutrophils Relative %: 93 %
Platelets: 116 10*3/uL — ABNORMAL LOW (ref 150–440)
RBC: 3.76 MIL/uL — AB (ref 4.40–5.90)
RDW: 14.5 % (ref 11.5–14.5)
WBC: 15.4 10*3/uL — AB (ref 3.8–10.6)

## 2015-02-08 LAB — AMMONIA: Ammonia: 56 umol/L — ABNORMAL HIGH (ref 9–35)

## 2015-02-08 LAB — APTT: APTT: 27 s (ref 24–36)

## 2015-02-08 LAB — LIPASE, BLOOD: Lipase: 36 U/L (ref 22–51)

## 2015-02-08 MED ORDER — OCTREOTIDE LOAD VIA INFUSION
50.0000 ug | Freq: Once | INTRAVENOUS | Status: AC
  Administered 2015-02-08: 50 ug via INTRAVENOUS
  Filled 2015-02-08: qty 25

## 2015-02-08 MED ORDER — ONDANSETRON HCL 4 MG/2ML IJ SOLN
4.0000 mg | Freq: Four times a day (QID) | INTRAMUSCULAR | Status: DC
  Administered 2015-02-09 – 2015-02-11 (×9): 4 mg via INTRAVENOUS
  Filled 2015-02-08 (×9): qty 2

## 2015-02-08 MED ORDER — ACETAMINOPHEN 650 MG RE SUPP
650.0000 mg | Freq: Four times a day (QID) | RECTAL | Status: DC | PRN
  Filled 2015-02-08: qty 1

## 2015-02-08 MED ORDER — PANTOPRAZOLE SODIUM 40 MG IV SOLR
80.0000 mg | Freq: Once | INTRAVENOUS | Status: AC
  Administered 2015-02-08: 80 mg via INTRAVENOUS
  Filled 2015-02-08: qty 80

## 2015-02-08 MED ORDER — MORPHINE SULFATE 2 MG/ML IJ SOLN: 2.0000 mg | INTRAMUSCULAR | Status: DC | PRN

## 2015-02-08 MED ORDER — SODIUM CHLORIDE 0.9 % IV SOLN
50.0000 ug/h | INTRAVENOUS | Status: DC
  Administered 2015-02-08 – 2015-02-09 (×3): 50 ug/h via INTRAVENOUS
  Filled 2015-02-08 (×8): qty 1

## 2015-02-08 MED ORDER — ACETAMINOPHEN 325 MG PO TABS
650.0000 mg | ORAL_TABLET | Freq: Four times a day (QID) | ORAL | Status: DC | PRN
  Filled 2015-02-08: qty 2

## 2015-02-08 MED ORDER — SODIUM CHLORIDE 0.9 % IV SOLN
INTRAVENOUS | Status: DC
  Administered 2015-02-08 – 2015-02-09 (×2): via INTRAVENOUS

## 2015-02-08 MED ORDER — SODIUM CHLORIDE 0.9 % IJ SOLN
3.0000 mL | Freq: Two times a day (BID) | INTRAMUSCULAR | Status: DC
  Administered 2015-02-09 – 2015-02-11 (×3): 3 mL via INTRAVENOUS

## 2015-02-08 MED ORDER — SODIUM CHLORIDE 0.9 % IV SOLN
1000.0000 mL | Freq: Once | INTRAVENOUS | Status: AC
  Administered 2015-02-08: 1000 mL via INTRAVENOUS

## 2015-02-08 MED ORDER — CEFTRIAXONE SODIUM IN DEXTROSE 40 MG/ML IV SOLN
2.0000 g | Freq: Once | INTRAVENOUS | Status: AC
Start: 2015-02-08 — End: 2015-02-08
  Administered 2015-02-08: 2 g via INTRAVENOUS
  Filled 2015-02-08: qty 50

## 2015-02-08 NOTE — ED Notes (Signed)
Patient to ED with c/o vomiting coffee ground emesis as well as some bright red blood since around noon today, patient reports history of same.

## 2015-02-08 NOTE — H&P (Signed)
Corey West at [place] NAME: Corey West    MR#:  [id number]  DATE OF BIRTH:  1951/06/25   DATE OF ADMISSION:  02/08/2015  PRIMARY CARE PHYSICIAN: No primary care provider on file. [facility]  REQUESTING/REFERRING PHYSICIAN: Quale  CHIEF COMPLAINT:   Chief Complaint  Patient presents with  . Hematemesis    HISTORY OF PRESENT ILLNESS:  Corey West  is a 64 y.o. male with a known history of cirrhosis with esophageal varices presented with hematemesis. He describes one day duration of hematemesis with multiple bouts of vomiting originally dark material now with some frank red blood. Despite this he denies further symptoms including chest pain, shortness of breath, abdominal pain. Emergency department course: Case was discussed with gastroenterology, octreotide and Protonix started.  PAST MEDICAL HISTORY:   Past Medical History  Diagnosis Date  . Cirrhosis     PAST SURGICAL HISTORY:  History reviewed. No pertinent past surgical history.  SOCIAL HISTORY:   History  Substance Use Topics  . Smoking status: Current Some Day Smoker  . Smokeless tobacco: Never Used  . Alcohol Use: No    FAMILY HISTORY:   Family History  Problem Relation Age of Onset  . Diabetes Other     DRUG ALLERGIES:  No Known Allergies  REVIEW OF SYSTEMS:  REVIEW OF SYSTEMS:  CONSTITUTIONAL: Denies fevers, chills, fatigue, weakness.  EYES: Denies blurred vision, double vision, or eye pain.  EARS, NOSE, THROAT: Denies tinnitus, ear pain, hearing loss.  RESPIRATORY: denies cough, shortness of breath, wheezing  CARDIOVASCULAR: Denies chest pain, palpitations, edema.  GASTROINTESTINAL: Positive for nausea, vomiting, hematemesis denies diarrhea, abdominal pain.  GENITOURINARY: Denies dysuria, hematuria.  ENDOCRINE: Denies nocturia or thyroid problems. HEMATOLOGIC AND LYMPHATIC: Denies easy bruising or bleeding.  SKIN: Denies rash or  lesions.  MUSCULOSKELETAL: Denies pain in neck, back, shoulder, knees, hips, or further arthritic symptoms.  NEUROLOGIC: Denies paralysis, paresthesias.  PSYCHIATRIC: Denies anxiety or depressive symptoms. Otherwise full review of systems performed by me is negative.   MEDICATIONS AT HOME:   Prior to Admission medications   Medication Sig Start Date End Date Taking? Authorizing Provider  ammonium lactate (AMLACTIN) 12 % cream Apply 1 g topically 2 (two) times daily.   Yes Historical Provider, MD  Ferrous Sulfate 140 (45 FE) MG TBCR Take 1 tablet by mouth daily.   Yes Historical Provider, MD  furosemide (LASIX) 40 MG tablet Take 40 mg by mouth 2 (two) times daily.   Yes Historical Provider, MD  insulin aspart protamine- aspart (NOVOLOG MIX 70/30) (70-30) 100 UNIT/ML injection Inject 62-84 Units into the skin 2 (two) times daily with a meal. Pt uses 84 units with breakfast and 62 units with supper.   Yes Historical Provider, MD  lactulose (CHRONULAC) 10 GM/15ML solution Take 20 g by mouth 2 (two) times daily.   Yes Historical Provider, MD  LORazepam (ATIVAN) 0.5 MG tablet Take 0.5 mg by mouth every 8 (eight) hours as needed for anxiety.   Yes Historical Provider, MD  metolazone (ZAROXOLYN) 2.5 MG tablet Take 2.5 mg by mouth every other day.   Yes Historical Provider, MD  nadolol (CORGARD) 40 MG tablet Take 40 mg by mouth daily.   Yes Historical Provider, MD  omeprazole (PRILOSEC) 40 MG capsule Take 40 mg by mouth 2 (two) times daily.   Yes Historical Provider, MD  oxyCODONE (OXY IR/ROXICODONE) 5 MG immediate release tablet Take 5 mg by mouth every 6 (  six) hours as needed for severe pain.    Yes Historical Provider, MD  rifaximin (XIFAXAN) 550 MG TABS tablet Take 550 mg by mouth 2 (two) times daily.   Yes Historical Provider, MD  simvastatin (ZOCOR) 40 MG tablet Take 40 mg by mouth at bedtime.   Yes Historical Provider, MD  spironolactone (ALDACTONE) 25 MG tablet Take 25 mg by mouth daily.   Yes  Historical Provider, MD      VITAL SIGNS:  Blood pressure 121/55, pulse 81, temperature 99.8 F (37.7 C), temperature source Oral, resp. rate 22, height 5\' 9"  (1.753 m), weight 321 lb (145.605 kg), SpO2 96 %.  PHYSICAL EXAMINATION:  VITAL SIGNS: Filed Vitals:   02/08/15 2300  BP: 121/55  Pulse: 81  Temp:   Resp:    GENERAL:64 y.o.male chronically ill-appearing HEAD: Normocephalic, atraumatic.  EYES: Pupils equal, round, reactive to light. Extraocular muscles intact. No scleral icterus.  MOUTH: Moist mucosal membrane. Dentition intact. No abscess noted.  EAR, NOSE, THROAT: Clear without exudates. No external lesions.  NECK: Supple. No thyromegaly. No nodules. No JVD.  PULMONARY: Clear to ascultation, without wheeze rails or rhonci. No use of accessory muscles, Good respiratory effort. good air entry bilaterally CHEST: Nontender to palpation.  CARDIOVASCULAR: S1 and S2. Regular rate and rhythm. No murmurs, rubs, or gallops. 2+ edema. Pedal pulses 2+ bilaterally.  GASTROINTESTINAL: Soft, nontender, nondistended. No masses. Positive bowel sounds. No hepatosplenomegaly.  MUSCULOSKELETAL: No swelling, clubbing, or edema. Range of motion full in all extremities.  NEUROLOGIC: Cranial nerves II through XII are intact. No gross focal neurological deficits. Sensation intact. Reflexes intact.  SKIN: Skin changes consistent with chronic edema, lower extremity bilateral otherwise No further ulceration, lesions, rashes, or cyanosis. Skin warm and dry. Turgor intact.  PSYCHIATRIC: Mood, affect within normal limits. The patient is awake, alert and oriented x 3. Insight, judgment intact.    LABORATORY PANEL:   CBC  Recent Labs Lab 02/08/15 2133  WBC 15.4*  HGB 11.8*  HCT 34.2*  PLT 116*   ------------------------------------------------------------------------------------------------------------------  Chemistries   Recent Labs Lab 02/08/15 2133  NA 133*  K 3.4*  CL 95*  CO2 28   GLUCOSE 138*  BUN 24*  CREATININE 2.37*  CALCIUM 10.1  AST 25  ALT 17  ALKPHOS 54  BILITOT 1.3*   ------------------------------------------------------------------------------------------------------------------  Cardiac Enzymes No results for input(s): TROPONINI in the last 168 hours. ------------------------------------------------------------------------------------------------------------------  RADIOLOGY:  Dg Chest Portable 1 View  02/08/2015   CLINICAL DATA:  Gastrointestinal bleeding. Vomiting. Weakness and vomiting beginning today at noon. Initial encounter.  EXAM: PORTABLE CHEST - 1 VIEW  COMPARISON:  09/10/2014.  FINDINGS: Cardiopericardial silhouette is enlarged for projection. Low volumes are present. RIGHT basilar atelectasis. No airspace disease or effusion.  IMPRESSION: Cardiomegaly without failure.   Electronically Signed   By: Dereck Ligas M.D.   On: 02/08/2015 22:24    EKG:  No orders found for this or any previous visit.  IMPRESSION AND PLAN:   64 year old gentleman with history of cirrhosis with esophageal varices is anything with hematemesis.  1. Hematemesis: Initial vital signs and blood count within normal limits, trend CBC every 6 hours, type and cross, patient consented for blood transfusion will transfuse if hemoglobin less than 7 or become symptomatic, scheduled Zofran, Protonix drip, octreotide drip, consult gastroenterology, ceftriaxone for SBP coverage  2. Type 2 diabetes insulin requiring: Hold po agents, place on insulin sliding scale every 6 hours Accu-Cheks 3. Gastroesophageal reflux disease: Protonix drip as above 4.  Hyperlipidemia unspecified: Statin therapy 5.Venous thromboembolism prophylactic: SCD      All the records are reviewed and case discussed with ED provider. Management plans discussed with the patient, family and they are in agreement.  CODE STATUS: Full  TOTAL TIME TAKING CARE OF THIS PATIENT: 45 minutes.    Hower,   Karenann Cai.D on 02/08/2015 at 11:49 PM  Between 7am to 6pm - Pager - [phone]  After 6pm: House Pager: - [phone]  [facility]  Office  [phone]  CC: Primary care physician; No primary care provider on file. Lisette Grinder, [facility]

## 2015-02-08 NOTE — ED Provider Notes (Signed)
[facility] Emergency Department Provider Note  ____________________________________________  Time seen: Approximately 9:54 PM  I have reviewed the triage vital signs and the nursing notes.   HISTORY  Chief Complaint Hematemesis    HPI Corey West is a 64 y.o. male presents with vomiting since about noon today. Patient reports he had about 5 episodes of emesis all of which have been dark and grossly bloody. He denies abdominal pain or fevers, he states that he just suddenly becomes nauseated and will vomit up blood.  He denies diarrhea or blood in his stool.  Denies pain. Timing is sudden onset about noon today. Last vomited about one half hours ago. Severity is described as moderate. Quality is described as "vomiting". Context is in the setting of history of prior Mallory-Weiss tear as well as varices and cirrhosis.   Diabetes Cirrhosis Varices Mallory-Weiss tear Past Medical History  Diagnosis Date  . Cirrhosis     There are no active problems to display for this patient.   History reviewed. No pertinent past surgical history.  Current Outpatient Rx  Name  Route  Sig  Dispense  Refill  . ammonium lactate (AMLACTIN) 12 % cream   Topical   Apply 1 g topically 2 (two) times daily.         . Ferrous Sulfate 140 (45 FE) MG TBCR   Oral   Take 1 tablet by mouth daily.         . furosemide (LASIX) 40 MG tablet   Oral   Take 40 mg by mouth 2 (two) times daily.         . insulin aspart protamine- aspart (NOVOLOG MIX 70/30) (70-30) 100 UNIT/ML injection   Subcutaneous   Inject 62-84 Units into the skin 2 (two) times daily with a meal. Pt uses 84 units with breakfast and 62 units with supper.         . lactulose (CHRONULAC) 10 GM/15ML solution   Oral   Take 20 g by mouth 2 (two) times daily.         Marland Kitchen LORazepam (ATIVAN) 0.5 MG tablet   Oral   Take 0.5 mg by mouth every 8 (eight) hours as needed for anxiety.         . metolazone  (ZAROXOLYN) 2.5 MG tablet   Oral   Take 2.5 mg by mouth every other day.         . nadolol (CORGARD) 40 MG tablet   Oral   Take 40 mg by mouth daily.         Marland Kitchen omeprazole (PRILOSEC) 40 MG capsule   Oral   Take 40 mg by mouth 2 (two) times daily.         Marland Kitchen oxyCODONE (OXY IR/ROXICODONE) 5 MG immediate release tablet   Oral   Take 5 mg by mouth every 6 (six) hours as needed for severe pain.          . rifaximin (XIFAXAN) 550 MG TABS tablet   Oral   Take 550 mg by mouth 2 (two) times daily.         . simvastatin (ZOCOR) 40 MG tablet   Oral   Take 40 mg by mouth at bedtime.         Marland Kitchen spironolactone (ALDACTONE) 25 MG tablet   Oral   Take 25 mg by mouth daily.           Allergies Review of patient's allergies indicates no known allergies.  History reviewed. No pertinent family history.  Social History History  Substance Use Topics  . Smoking status: Current Some Day Smoker  . Smokeless tobacco: Never Used  . Alcohol Use: No   no alcohol use Occasionally smokes a cigar No illicit drugs  Review of Systems Constitutional: No fever/chills Eyes: No visual changes. ENT: No sore throat. Cardiovascular: Denies chest pain. Respiratory: Denies shortness of breath. Gastrointestinal: No abdominal pain.  See history of present illness  No diarrhea.  No constipation. Genitourinary: Negative for dysuria. Musculoskeletal: Negative for back pain. Skin: Negative for rash. Neurological: Negative for headaches, focal weakness or numbness.  10-point ROS otherwise negative.  ____________________________________________   PHYSICAL EXAM:  VITAL SIGNS: ED Triage Vitals  Enc Vitals Group     BP --      Pulse --      Resp --      Temp --      Temp src --      SpO2 --      Weight --      Height --      Head Cir --      Peak Flow --      Pain Score --      Pain Loc --      Pain Edu? --      Excl. in [place]? --     Constitutional: Alert and oriented. Fatigued  appearing. No acute distress. Eyes: Conjunctivae are normal. PERRL. EOMI. Head: Atraumatic. Nose: No congestion/rhinnorhea. Mouth/Throat: Mucous membranes are dry and there is obvious dried blood over the lower portion of the mouth and also some in his hair  Oropharynx non-erythematous.  Neck: No stridor.  Cardiovascular: Normal rate, regular rhythm. Grossly normal heart sounds.  Good peripheral circulation. Respiratory: Normal respiratory effort.  No retractions. Lungs CTAB. Gastrointestinal: Soft and nontender. No distention. No abdominal bruits. No CVA tenderness. Musculoskeletal: No lower extremity tenderness nor edema.  No joint effusions. Neurologic:  Normal speech and language. No gross focal neurologic deficits are appreciated. Speech is normal. No gait instability. Skin:  Skin is warm, dry and intact. No rash noted. Psychiatric: Mood and affect are normal. Speech and behavior are normal.  Patient does not take any anticoagulants  ____________________________________________   LABS (all labs ordered are listed, but only abnormal results are displayed)  Labs Reviewed  CBC WITH DIFFERENTIAL/PLATELET - Abnormal; Notable for the following:    WBC 15.4 (*)    RBC 3.76 (*)    Hemoglobin 11.8 (*)    HCT 34.2 (*)    Platelets 116 (*)    Neutro Abs 14.3 (*)    Lymphs Abs 0.6 (*)    All other components within normal limits  PROTIME-INR - Abnormal; Notable for the following:    Prothrombin Time 15.7 (*)    All other components within normal limits  APTT  AMMONIA  COMPREHENSIVE METABOLIC PANEL  LIPASE, BLOOD  TYPE AND SCREEN  ABO/RH   ____________________________________________  EKG   Date: 02/08/2015  Rate: 80  Rhythm: normal sinus rhythm, abnormal P-wave voltage suggestive of a possible ectopic atrial pacer.  QRS Axis: normal  Intervals: normal  ST/T Wave abnormalities: normal  Conduction Disutrbances: none  Narrative Interpretation: Nonischemic. Sinus. Possible  ectopic atrial rhythm.     ____________________________________________  RADIOLOGY  Chest x-ray; no acute ____________________________________________   PROCEDURES  Procedure(s) performed: None  Critical Care performed: No  ____________________________________________   INITIAL IMPRESSION / ASSESSMENT AND PLAN / ED COURSE  Pertinent labs &  imaging results that were available during my care of the patient were reviewed by me and considered in my medical decision making (see chart for details).  Upper GI bleeding appears to be the most consistent etiology for today's presentation. There is obvious dried blood in the oropharynx. I am very concerned about the possibility of another Mallory-Weiss type tear, or alternatively variceal bleeding. All otherwise peptic ulcer disease is a possibility, however given the patient's history of cirrhosis and varices I immediately discussed with Dr. Rayann Heman of gastroenterology. We will initiate an octreotide drip, PPI, obtain labs, start ceftriaxone, and fully anticipated admission to the hospital. We'll continue the patient on cardiac monitoring and evaluate for signs of ongoing blood loss. At the present time the patient is stable, however given his condition is quite possible he could become acutely unstable with very little warning.  No evidence of lower GI bleeding at this time. No cardiopulmonary symptoms. Fully awake and alert, stable vital signs at present in the ER.  ----------------------------------------- 10:57 PM on 02/08/2015 -----------------------------------------  Patient remains symptomatically stable. Labs reviewed in hemoglobin 11. Discussed with Dr. Lavetta Nielsen. Will admit patient to the hospital for ongoing monitoring and GI consultation. ____________________________________________   FINAL CLINICAL IMPRESSION(S) / ED DIAGNOSES  Final diagnoses:  None   acute upper GI bleed initial    Delman Kitten, MD 02/08/15 2258

## 2015-02-09 ENCOUNTER — Encounter
Admission: EM | Disposition: A | Discharge status: Home / Self Care | Payer: Self-pay | Source: Home / Self Care | Attending: Internal Medicine

## 2015-02-09 ENCOUNTER — Inpatient Hospital Stay: Payer: BLUE CROSS/BLUE SHIELD | Admitting: Anesthesiology

## 2015-02-09 ENCOUNTER — Encounter
Admission: EM | Disposition: A | Discharge status: Home / Self Care | Payer: BLUE CROSS/BLUE SHIELD | Source: Home / Self Care | Attending: Internal Medicine

## 2015-02-09 ENCOUNTER — Encounter: Payer: Self-pay | Admitting: Anesthesiology

## 2015-02-09 HISTORY — PX: ESOPHAGOGASTRODUODENOSCOPY: SHX5428

## 2015-02-09 LAB — CBC WITH DIFFERENTIAL/PLATELET
BASOS ABS: 0.1 10*3/uL (ref 0–0.1)
Eosinophils Absolute: 0 10*3/uL (ref 0–0.7)
HCT: 28.5 % — ABNORMAL LOW (ref 40.0–52.0)
Hemoglobin: 9.7 g/dL — ABNORMAL LOW (ref 13.0–18.0)
Lymphocytes Relative: 6 %
Lymphs Abs: 0.9 10*3/uL — ABNORMAL LOW (ref 1.0–3.6)
MCH: 31.3 pg (ref 26.0–34.0)
MCHC: 34 g/dL (ref 32.0–36.0)
MCV: 92.2 fL (ref 80.0–100.0)
MONO ABS: 0.6 10*3/uL (ref 0.2–1.0)
Monocytes Relative: 4 %
Neutro Abs: 13.5 10*3/uL — ABNORMAL HIGH (ref 1.4–6.5)
Neutrophils Relative %: 90 %
Platelets: 70 10*3/uL — ABNORMAL LOW (ref 150–440)
RBC: 3.1 MIL/uL — ABNORMAL LOW (ref 4.40–5.90)
RDW: 14.5 % (ref 11.5–14.5)
WBC: 15.1 10*3/uL — AB (ref 3.8–10.6)

## 2015-02-09 LAB — CBC
HCT: 30.4 % — ABNORMAL LOW (ref 40.0–52.0)
HEMATOCRIT: 21 % — AB (ref 40.0–52.0)
Hemoglobin: 7.2 g/dL — ABNORMAL LOW (ref 13.0–18.0)
Hemoglobin: 10.6 g/dL — ABNORMAL LOW (ref 13.0–18.0)
MCH: 31.2 pg (ref 26.0–34.0)
MCH: 31.8 pg (ref 26.0–34.0)
MCHC: 34.1 g/dL (ref 32.0–36.0)
MCHC: 34.5 g/dL (ref 32.0–36.0)
MCV: 91.7 fL (ref 80.0–100.0)
MCV: 92.2 fL (ref 80.0–100.0)
PLATELETS: 79 10*3/uL — AB (ref 150–440)
Platelets: 67 10*3/uL — ABNORMAL LOW (ref 150–440)
RBC: 2.28 MIL/uL — ABNORMAL LOW (ref 4.40–5.90)
RBC: 3.32 MIL/uL — ABNORMAL LOW (ref 4.40–5.90)
RDW: 14.3 % (ref 11.5–14.5)
RDW: 14.4 % (ref 11.5–14.5)
WBC: 10.2 10*3/uL (ref 3.8–10.6)
WBC: 18.5 10*3/uL — ABNORMAL HIGH (ref 3.8–10.6)

## 2015-02-09 LAB — COMPREHENSIVE METABOLIC PANEL
ALBUMIN: 2.9 g/dL — AB (ref 3.5–5.0)
ALT: 15 U/L — ABNORMAL LOW (ref 17–63)
AST: 20 U/L (ref 15–41)
Alkaline Phosphatase: 45 U/L (ref 38–126)
Anion gap: 10 (ref 5–15)
BUN: 26 mg/dL — ABNORMAL HIGH (ref 6–20)
CALCIUM: 9.2 mg/dL (ref 8.9–10.3)
CO2: 26 mmol/L (ref 22–32)
Chloride: 98 mmol/L — ABNORMAL LOW (ref 101–111)
Creatinine, Ser: 2.36 mg/dL — ABNORMAL HIGH (ref 0.61–1.24)
GFR calc Af Amer: 32 mL/min — ABNORMAL LOW (ref >60)
GFR, EST NON AFRICAN AMERICAN: 28 mL/min — AB (ref >60)
Glucose, Bld: 145 mg/dL — ABNORMAL HIGH (ref 65–99)
Potassium: 3.7 mmol/L (ref 3.5–5.1)
Sodium: 134 mmol/L — ABNORMAL LOW (ref 135–145)
Total Bilirubin: 1.1 mg/dL (ref 0.3–1.2)
Total Protein: 6.6 g/dL (ref 6.5–8.1)

## 2015-02-09 LAB — PREPARE RBC (CROSSMATCH)

## 2015-02-09 LAB — ABO/RH: ABO/RH(D): A NEG

## 2015-02-09 LAB — GLUCOSE, CAPILLARY: GLUCOSE-CAPILLARY: 106 mg/dL — AB (ref 70–99)

## 2015-02-09 SURGERY — EGD (ESOPHAGOGASTRODUODENOSCOPY)
Anesthesia: Monitor Anesthesia Care

## 2015-02-09 SURGERY — EGD (ESOPHAGOGASTRODUODENOSCOPY)
Anesthesia: General

## 2015-02-09 MED ORDER — PANTOPRAZOLE SODIUM 40 MG IV SOLR: 40.0000 mg | Freq: Two times a day (BID) | INTRAVENOUS | Status: DC

## 2015-02-09 MED ORDER — NADOLOL 20 MG PO TABS
40.0000 mg | ORAL_TABLET | Freq: Every day | ORAL | Status: DC
  Administered 2015-02-10: 40 mg via ORAL
  Filled 2015-02-09: qty 2
  Filled 2015-02-09: qty 1

## 2015-02-09 MED ORDER — GLYCOPYRROLATE 0.2 MG/ML IJ SOLN
INTRAMUSCULAR | Status: DC | PRN
  Administered 2015-02-09: 0.2 mg via INTRAVENOUS

## 2015-02-09 MED ORDER — LACTULOSE 10 GM/15ML PO SOLN
20.0000 g | Freq: Two times a day (BID) | ORAL | Status: DC
  Administered 2015-02-09 – 2015-02-10 (×3): 20 g via ORAL
  Filled 2015-02-09 (×5): qty 30

## 2015-02-09 MED ORDER — FERROUS SULFATE 325 (65 FE) MG PO TABS
325.0000 mg | ORAL_TABLET | Freq: Every day | ORAL | Status: DC
  Administered 2015-02-09: 325 mg via ORAL
  Filled 2015-02-09 (×3): qty 1

## 2015-02-09 MED ORDER — FERROUS SULFATE ER 140 (45 FE) MG PO TBCR: 1.0000 | EXTENDED_RELEASE_TABLET | Freq: Every day | ORAL | Status: DC

## 2015-02-09 MED ORDER — CEFTRIAXONE SODIUM IN DEXTROSE 20 MG/ML IV SOLN
1.0000 g | INTRAVENOUS | Status: DC
  Administered 2015-02-09: 1 g via INTRAVENOUS
  Filled 2015-02-09 (×2): qty 50

## 2015-02-09 MED ORDER — PROPOFOL INFUSION 10 MG/ML OPTIME
INTRAVENOUS | Status: DC | PRN
  Administered 2015-02-09: 300 ug/kg/min via INTRAVENOUS

## 2015-02-09 MED ORDER — SPIRONOLACTONE 25 MG PO TABS
25.0000 mg | ORAL_TABLET | Freq: Every day | ORAL | Status: DC
Start: 2015-02-09 — End: 2015-02-11
  Administered 2015-02-09 – 2015-02-11 (×3): 25 mg via ORAL
  Filled 2015-02-09 (×4): qty 1

## 2015-02-09 MED ORDER — RIFAXIMIN 550 MG PO TABS
550.0000 mg | ORAL_TABLET | Freq: Two times a day (BID) | ORAL | Status: DC
  Administered 2015-02-09 – 2015-02-11 (×5): 550 mg via ORAL
  Filled 2015-02-09 (×8): qty 1

## 2015-02-09 MED ORDER — FERROUS SULFATE ER 140 (45 FE) MG PO TBCR
1.0000 | EXTENDED_RELEASE_TABLET | Freq: Every day | ORAL | Status: DC
  Filled 2015-02-09: qty 1

## 2015-02-09 MED ORDER — SIMVASTATIN 40 MG PO TABS
40.0000 mg | ORAL_TABLET | Freq: Every day | ORAL | Status: DC
  Administered 2015-02-09 – 2015-02-10 (×2): 40 mg via ORAL
  Filled 2015-02-09 (×4): qty 1

## 2015-02-09 MED ORDER — PROPOFOL 10 MG/ML IV BOLUS
INTRAVENOUS | Status: DC | PRN
  Administered 2015-02-09: 30 mg via INTRAVENOUS
  Administered 2015-02-09: 50 mg via INTRAVENOUS

## 2015-02-09 MED ORDER — EPHEDRINE SULFATE 50 MG/ML IJ SOLN
INTRAMUSCULAR | Status: DC | PRN
  Administered 2015-02-09: 10 mg via INTRAVENOUS

## 2015-02-09 MED ORDER — OXYCODONE HCL 5 MG PO TABS: 5.0000 mg | ORAL_TABLET | Freq: Four times a day (QID) | ORAL | Status: DC | PRN

## 2015-02-09 MED ORDER — LIDOCAINE HCL (CARDIAC) 20 MG/ML IV SOLN
INTRAVENOUS | Status: DC | PRN
  Administered 2015-02-09: 100 mg via INTRAVENOUS

## 2015-02-09 MED ORDER — SODIUM CHLORIDE 0.9 % IV SOLN: Freq: Once | INTRAVENOUS | Status: DC

## 2015-02-09 MED ORDER — PANTOPRAZOLE SODIUM 40 MG IV SOLR
40.0000 mg | Freq: Two times a day (BID) | INTRAVENOUS | Status: DC
  Administered 2015-02-09 – 2015-02-11 (×5): 40 mg via INTRAVENOUS
  Filled 2015-02-09 (×8): qty 40

## 2015-02-09 MED ORDER — LORAZEPAM 0.5 MG PO TABS
0.5000 mg | ORAL_TABLET | Freq: Three times a day (TID) | ORAL | Status: DC | PRN
  Administered 2015-02-10: 0.5 mg via ORAL
  Filled 2015-02-09: qty 1

## 2015-02-09 MED ORDER — SODIUM CHLORIDE 0.9 % IV SOLN
INTRAVENOUS | Status: DC
  Administered 2015-02-09 – 2015-02-10 (×3): via INTRAVENOUS

## 2015-02-09 MED ORDER — SODIUM CHLORIDE 0.9 % IV SOLN: INTRAVENOUS | Status: DC

## 2015-02-09 MED ORDER — SODIUM CHLORIDE 0.9 % IV SOLN
8.0000 mg/h | INTRAVENOUS | Status: DC
  Administered 2015-02-09: 8 mg/h via INTRAVENOUS
  Filled 2015-02-09: qty 80

## 2015-02-09 MED ORDER — PNEUMOCOCCAL VAC POLYVALENT 25 MCG/0.5ML IJ INJ: 0.5000 mL | INJECTION | INTRAMUSCULAR | Status: DC

## 2015-02-09 MED ORDER — GELOCAST UNNAS BOOT EX MISC
2.0000 | CUTANEOUS | Status: DC
  Administered 2015-02-09: 2 via CUTANEOUS
  Filled 2015-02-09: qty 2

## 2015-02-09 NOTE — Transfer of Care (Signed)
Immediate Anesthesia Transfer of Care Note  Patient: Corey West  Procedure(s) Performed: Procedure(s): ESOPHAGOGASTRODUODENOSCOPY (EGD) (N/A)  Patient Location: PACU  Anesthesia Type:General  Level of Consciousness: awake  Airway & Oxygen Therapy: Patient Spontanous Breathing and Patient connected to nasal cannula oxygen  Post-op Assessment: Report given to RN and Post -op Vital signs reviewed and stable  Post vital signs: stable  Last Vitals:  Filed Vitals:   02/09/15 1322  BP: 90/34  Pulse: 57  Temp: 36.7 C  Resp: 18    Complications: No apparent anesthesia complications

## 2015-02-09 NOTE — Progress Notes (Addendum)
Pt lying in bed resting quietly , pt cont. On Octreotide@25  ml. Pt has had no vomiting this shift , no C/o pain or distress noted.pt has wound care consult in for cellulitis. Further assessment via flowsheet. Dr Lavetta Nielsen aware of hgb 7.2 no new orders noted

## 2015-02-09 NOTE — Progress Notes (Signed)
Pt has cash that will be placed in the safe. $266.00 security verified , key placed in chart  Safe #8

## 2015-02-09 NOTE — Anesthesia Preprocedure Evaluation (Addendum)
Anesthesia Evaluation  Patient identified by MRN, date of birth, ID band Patient awake    Airway Mallampati: III  TM Distance: >3 FB Neck ROM: Full    Dental   Pulmonary sleep apnea and Continuous Positive Airway Pressure Ventilation , Current Smoker,          Cardiovascular     Neuro/Psych    GI/Hepatic GERD-  Medicated and Poorly Controlled,  Endo/Other  diabetes, Type 2  Renal/GU      Musculoskeletal   Abdominal (+) + obese,   Peds  Hematology   Anesthesia Other Findings   Reproductive/Obstetrics                            Anesthesia Physical Anesthesia Plan  ASA: III  Anesthesia Plan: General   Post-op Pain Management:    Induction:   Airway Management Planned: Mask  Additional Equipment:   Intra-op Plan:   Post-operative Plan:   Informed Consent: I have reviewed the patients History and Physical, chart, labs and discussed the procedure including the risks, benefits and alternatives for the proposed anesthesia with the patient or authorized representative who has indicated his/her understanding and acceptance.     Plan Discussed with:   Anesthesia Plan Comments:         Anesthesia Quick Evaluation

## 2015-02-09 NOTE — Anesthesia Postprocedure Evaluation (Signed)
  Anesthesia Post-op Note  Patient: Corey West  Procedure(s) Performed: Procedure(s): ESOPHAGOGASTRODUODENOSCOPY (EGD) (N/A)  Anesthesia type:General  Patient location: PACU  Post pain: Pain level controlled  Post assessment: Post-op Vital signs reviewed, Patient's Cardiovascular Status Stable, Respiratory Function Stable, Patent Airway and No signs of Nausea or vomiting  Post vital signs: Reviewed and stable  Last Vitals:  Filed Vitals:   02/09/15 1322  BP: 90/34  Pulse: 57  Temp: 36.7 C  Resp: 18    Level of consciousness: awake, alert  and patient cooperative  Complications: No apparent anesthesia complications

## 2015-02-09 NOTE — Progress Notes (Signed)
[place] [facility] [place] NAME: Corey West    MR#:  [id number]  DATE OF BIRTH:  04/07/1951  SUBJECTIVE:  No complaints this morning. No further vomiting.  REVIEW OF SYSTEMS:  CONSTITUTIONAL: No fever, fatigue or weakness.  EYES: No blurred or double vision.  EARS, NOSE, AND THROAT: No tinnitus or ear pain.  RESPIRATORY: No cough, shortness of breath, wheezing or hemoptysis.  CARDIOVASCULAR: No chest pain, orthopnea, edema.  GASTROINTESTINAL: No nausea, vomiting, diarrhea or abdominal pain.  GENITOURINARY: No dysuria, hematuria.  ENDOCRINE: No polyuria, nocturia,  HEMATOLOGY: No anemia, easy bruising or bleeding SKIN: No rash or lesion. MUSCULOSKELETAL: No joint pain or arthritis.   NEUROLOGIC: No tingling, numbness, weakness.  PSYCHIATRY: No anxiety or depression.   DRUG ALLERGIES:  No Known Allergies  VITALS:  Blood pressure 102/41, pulse 61, temperature 98.1 F (36.7 C), temperature source Tympanic, resp. rate 22, height 5\' 7"  (1.702 m), weight 138.2 kg (304 lb 10.8 oz), SpO2 100 %.  PHYSICAL EXAMINATION:  GENERAL:  64 y.o.-year-old patient lying in the bed with no acute distress. Obese EYES: Pupils equal, round, reactive to light and accommodation. No scleral icterus. EOMI  HEENT: Head atraumatic, normocephalic. Oropharynx and nasopharynx clear.  NECK:  Supple, no jugular venous distention. No thyroid enlargement, no tenderness.  LUNGS: Normal breath sounds bilaterally, no wheezing, rales,rhonchi or crepitation. No use of accessory muscles of respiration.  CARDIOVASCULAR: S1, S2 normal. No murmurs, rubs, or gallops.  ABDOMEN: Soft, nontender, nondistended. Bowel sounds present. No organomegaly or mass.  EXTREMITIES: No pedal edema, cyanosis, or clubbing.  NEUROLOGIC: Cranial nerves II through XII are intact. Muscle strength 5/5 in all extremities. Sensation intact. Gait not checked.  PSYCHIATRIC: The patient is alert and  oriented x 3.  SKIN: No obvious rash, lesion, or ulcer.    LABORATORY PANEL:   CBC  Recent Labs Lab 02/09/15 0950  WBC 15.1*  HGB 9.7*  HCT 28.5*  PLT 70*   ------------------------------------------------------------------------------------------------------------------  Chemistries   Recent Labs Lab 02/09/15 0342  NA 134*  K 3.7  CL 98*  CO2 26  GLUCOSE 145*  BUN 26*  CREATININE 2.36*  CALCIUM 9.2  AST 20  ALT 15*  ALKPHOS 45  BILITOT 1.1   ------------------------------------------------------------------------------------------------------------------  Cardiac Enzymes No results for input(s): TROPONINI in the last 168 hours. ------------------------------------------------------------------------------------------------------------------  RADIOLOGY:  Dg Chest Portable 1 View  02/08/2015   CLINICAL DATA:  Gastrointestinal bleeding. Vomiting. Weakness and vomiting beginning today at noon. Initial encounter.  EXAM: PORTABLE CHEST - 1 VIEW  COMPARISON:  09/10/2014.  FINDINGS: Cardiopericardial silhouette is enlarged for projection. Low volumes are present. RIGHT basilar atelectasis. No airspace disease or effusion.  IMPRESSION: Cardiomegaly without failure.   Electronically Signed   By: Dereck Ligas M.D.   On: 02/08/2015 22:24    EKG:   Orders placed or performed during the hospital encounter of 02/08/15  . EKG 12-Lead  . EKG 12-Lead    ASSESSMENT AND PLAN:    64 year old gentleman with history of cirrhosis with esophageal varices is anything with hematemesis.  1. Hematemesis: Patient with history of cirrhosis and recent Mallory-Weiss tear December 2015 [facility] gastroenterology consultation. Plan is for EGD this morning. Blood pressure and hemoglobin have been stable. Continue Zofran, Protonix, octreotide and ceftriaxone Likely stable for transfer to floor care after EGD  2. Type 2 diabetes insulin requiring:  Continue SSI  3. Chronic lower  extremity edema Appreciate wound care consultation and [place] boots  4. Hyperlipidemia unspecified: Statin therapy  5.Venous thromboembolism prophylactic: SCD  #6 chronic kidney disease stage III with baseline creatinine of 2 Continue with gentle hydration  All the records are reviewed and case discussed with Care Management/Social Workerr. Management plans discussed with the patient, family and they are in agreement.  CODE STATUS: Full   TOTAL TIME TAKING CARE OF THIS PATIENT: 35 minutes.   POSSIBLE D/C IN one DAYS, DEPENDING ON CLINICAL CONDITION.   Myrtis Ser M.D on 02/09/2015 at 1:52 PM  Between 7am to 6pm - Pager - [phone]  After 6pm go to [email or web address] - password [email or web address] [facility]  [facility]  Office  [phone]  CC: Primary care physician; No primary care provider on file.

## 2015-02-09 NOTE — Progress Notes (Signed)
Pt transported to endoscopy for egd report given to Almyra Free, Therapist, sports transported by [employer] orderly and Leisure [employer] manager

## 2015-02-09 NOTE — Progress Notes (Signed)
Pt alert and oriented; on 2L O2 via nasal canula with O2 sats upper 90's; vss; adequate urinary output via urinal; no c/o pain; nsr on cardiac monitor; per Dr Tiffany Kocher patient may transfer to 1C or 2C; tolerating clear liquid diet; no s/s of bleeding during shift; will continue to monitor and assess pt

## 2015-02-09 NOTE — Consult Note (Signed)
WOC wound consult note Reason for Consult: Bilateral venous stasis disease.  History of edema and Unnas boots.  Seen outpatient.  Wound type: Generalized edema from knee to foot bilaterally. Wears compression for management. Redness and warm to touch, but intact skin.  Will apply Unnas boots to manage edema.  Is on antibiotic therapy for cellulitis.  Pressure Ulcer POA: n/a Measurement:Intact skin.  Erythema from below knee to mid calf.  Skin is intact.  Generalized non pitting edema Wound [id number] Drainage (amount, consistency, odor) Minimal serous weeping.  Periwound:erythema Dressing procedure/placement/frequency:Cleanse bilateral legs and feet with soap and water and pat gently dry.  Apply Unnas boots (zinc layer secured with Coban) twice weekly.   Will not follow at this time.  Please re-consult if needed.  Domenic Moras RN BSN [place] Pager [phone]

## 2015-02-10 LAB — BASIC METABOLIC PANEL
Anion gap: 4 — ABNORMAL LOW (ref 5–15)
BUN: 28 mg/dL — ABNORMAL HIGH (ref 6–20)
CHLORIDE: 99 mmol/L — AB (ref 101–111)
CO2: 30 mmol/L (ref 22–32)
Calcium: 8.4 mg/dL — ABNORMAL LOW (ref 8.9–10.3)
Creatinine, Ser: 2.17 mg/dL — ABNORMAL HIGH (ref 0.61–1.24)
GFR calc non Af Amer: 31 mL/min — ABNORMAL LOW (ref >60)
GFR, EST AFRICAN AMERICAN: 35 mL/min — AB (ref >60)
Glucose, Bld: 201 mg/dL — ABNORMAL HIGH (ref 65–99)
POTASSIUM: 3.9 mmol/L (ref 3.5–5.1)
SODIUM: 133 mmol/L — AB (ref 135–145)

## 2015-02-10 LAB — CBC
HEMATOCRIT: 30 % — AB (ref 40.0–52.0)
Hemoglobin: 10.3 g/dL — ABNORMAL LOW (ref 13.0–18.0)
MCH: 32 pg (ref 26.0–34.0)
MCHC: 34.3 g/dL (ref 32.0–36.0)
MCV: 93.3 fL (ref 80.0–100.0)
Platelets: 76 10*3/uL — ABNORMAL LOW (ref 150–440)
RBC: 3.22 MIL/uL — AB (ref 4.40–5.90)
RDW: 14.8 % — AB (ref 11.5–14.5)
WBC: 11.5 10*3/uL — ABNORMAL HIGH (ref 3.8–10.6)

## 2015-02-10 LAB — MAGNESIUM: MAGNESIUM: 1.8 mg/dL (ref 1.7–2.4)

## 2015-02-10 LAB — PHOSPHORUS: Phosphorus: 3.1 mg/dL (ref 2.5–4.6)

## 2015-02-10 MED ORDER — SUCRALFATE 1 GM/10ML PO SUSP
1.0000 g | Freq: Three times a day (TID) | ORAL | Status: DC
  Administered 2015-02-10 – 2015-02-11 (×4): 1 g via ORAL
  Filled 2015-02-10 (×4): qty 10

## 2015-02-10 NOTE — Progress Notes (Signed)
ANTIBIOTIC CONSULT NOTE - INITIAL  Pharmacy Consult for Rocephin  Indication: SBP  No Known Allergies  Patient Measurements: Height: 5\' 7"  (170.2 cm) Weight: (!) 304 lb 10.8 oz (138.2 kg) IBW/kg (Calculated) : 66.1 Adjusted Body Weight:   Vital Signs: Temp: 97.3 F (36.3 C) (05/12 0742) Temp Source: Oral (05/12 0742) BP: 114/42 mmHg (05/12 0742) Pulse Rate: 52 (05/12 0742) Intake/Output from previous day: 05/11 0701 - 05/12 0700 In: 3113 [P.O.:240; I.V.:2873] Out: 1525 [Urine:1525] Intake/Output from this shift: Total I/O In: -  Out: 100 [Urine:100]  Labs:  Recent Labs  02/08/15 2133  02/09/15 0342 02/09/15 0950 02/10/15 0612  WBC 15.4*  < > 18.5* 15.1* 11.5*  HGB 11.8*  < > 10.6* 9.7* 10.3*  PLT 116*  < > 79* 70* 76*  CREATININE 2.37*  --  2.36*  --  2.17*  < > = values in this interval not displayed. Estimated Creatinine Clearance: 46.8 mL/min (by C-G formula based on Cr of 2.17). No results for input(s): VANCOTROUGH, VANCOPEAK, VANCORANDOM, GENTTROUGH, GENTPEAK, GENTRANDOM, TOBRATROUGH, TOBRAPEAK, TOBRARND, AMIKACINPEAK, AMIKACINTROU, AMIKACIN in the last 72 hours.   Microbiology: No results found for this or any previous visit (from the past 720 hour(s)).  Medical History: Past Medical History  Diagnosis Date  . Cirrhosis     Medications:  Scheduled:  . cefTRIAXone (ROCEPHIN)  IV  1 g Intravenous Q24H  . GELOCAST UNNAS BOOT  2 each Apply externally Once per day on Mon Thu  . lactulose  20 g Oral BID  . nadolol  40 mg Oral Daily  . ondansetron (ZOFRAN) IV  4 mg Intravenous 4 times per day  . pantoprazole (PROTONIX) IV  40 mg Intravenous Q12H  . pneumococcal 23 valent vaccine  0.5 mL Intramuscular Tomorrow-1000  . rifaximin  550 mg Oral BID  . simvastatin  40 mg Oral QHS  . sodium chloride  3 mL Intravenous Q12H  . spironolactone  25 mg Oral Daily   Assessment: WBC 15.1. Patient currently on Rocephin for SBP   Plan:  Follow up culture  results  Timberman,Mariajose Mow D 02/10/2015,9:44 AM

## 2015-02-10 NOTE — Consult Note (Signed)
Pt had no frank bleeding today and hgb stable.  No vomiting.  He had some borderline low BP and pulse.  Could go home tomorrow from a GI standpoint with tramadol and tylenol or vicodin for pain in legs.  Will be glad to see him in 2-3 weeks.

## 2015-02-10 NOTE — Progress Notes (Signed)
[place] at [place] NAME: Searcy Ooten    MR#:  [id number]  DATE OF BIRTH:  1951/01/01  SUBJECTIVE:  No complaints this morning. No further vomiting. No abdominal pain. Would like to advance diet.  REVIEW OF SYSTEMS:  CONSTITUTIONAL: No fever, fatigue or weakness.  EYES: No blurred or double vision.  EARS, NOSE, AND THROAT: No tinnitus or ear pain.  RESPIRATORY: No cough, shortness of breath, wheezing or hemoptysis.  CARDIOVASCULAR: No chest pain, orthopnea, edema.  GASTROINTESTINAL: No nausea, vomiting, diarrhea or abdominal pain.  GENITOURINARY: No dysuria, hematuria.  ENDOCRINE: No polyuria, nocturia,  HEMATOLOGY: No anemia, easy bruising or bleeding SKIN: No rash or lesion. MUSCULOSKELETAL: No joint pain or arthritis.   NEUROLOGIC: No tingling, numbness, weakness.  PSYCHIATRY: No anxiety or depression.   DRUG ALLERGIES:  No Known Allergies  VITALS:  Blood pressure 114/42, pulse 52, temperature 97.3 F (36.3 C), temperature source Oral, resp. rate 20, height 5\' 7"  (1.702 m), weight 138.2 kg (304 lb 10.8 oz), SpO2 100 %.  PHYSICAL EXAMINATION:  GENERAL:  64 y.o.-year-old patient lying in the bed with no acute distress. Obese EYES: Pupils equal, round, reactive to light and accommodation. No scleral icterus. EOMI  HEENT: Head atraumatic, normocephalic. Oropharynx and nasopharynx clear.  NECK:  Supple, no jugular venous distention. No thyroid enlargement, no tenderness.  LUNGS: Normal breath sounds bilaterally, no wheezing, rales,rhonchi or crepitation. No use of accessory muscles of respiration.  CARDIOVASCULAR: S1, S2 normal. No murmurs, rubs, or gallops.  ABDOMEN: Soft, nontender, nondistended. Bowel sounds present. No organomegaly or mass.  EXTREMITIES: No pedal edema, cyanosis, or clubbing.  NEUROLOGIC: Cranial nerves II through XII are intact. Muscle strength 5/5 in all extremities. Sensation intact. Gait not checked.   PSYCHIATRIC: The patient is alert and oriented x 3.  SKIN: No obvious rash, lesion, or ulcer.    LABORATORY PANEL:   CBC  Recent Labs Lab 02/10/15 0612  WBC 11.5*  HGB 10.3*  HCT 30.0*  PLT 76*   ------------------------------------------------------------------------------------------------------------------  Chemistries   Recent Labs Lab 02/09/15 0342 02/10/15 0612  NA 134* 133*  K 3.7 3.9  CL 98* 99*  CO2 26 30  GLUCOSE 145* 201*  BUN 26* 28*  CREATININE 2.36* 2.17*  CALCIUM 9.2 8.4*  MG  --  1.8  AST 20  --   ALT 15*  --   ALKPHOS 45  --   BILITOT 1.1  --    ------------------------------------------------------------------------------------------------------------------  Cardiac Enzymes No results for input(s): TROPONINI in the last 168 hours. ------------------------------------------------------------------------------------------------------------------  RADIOLOGY:  Dg Chest Portable 1 View  02/08/2015   CLINICAL DATA:  Gastrointestinal bleeding. Vomiting. Weakness and vomiting beginning today at noon. Initial encounter.  EXAM: PORTABLE CHEST - 1 VIEW  COMPARISON:  09/10/2014.  FINDINGS: Cardiopericardial silhouette is enlarged for projection. Low volumes are present. RIGHT basilar atelectasis. No airspace disease or effusion.  IMPRESSION: Cardiomegaly without failure.   Electronically Signed   By: Dereck Ligas M.D.   On: 02/08/2015 22:24    EKG:   Orders placed or performed during the hospital encounter of 02/08/15  . EKG 12-Lead  . EKG 12-Lead    ASSESSMENT AND PLAN:    64 year old gentleman with history of cirrhosis with esophageal varices is anything with hematemesis.  1. Hematemesis: Patient with history of cirrhosis and recent Mallory-Weiss tear December 2015. Appreciate gastroenterology consultation and EGD. Gastric ulcers, esophagitis and inflammation noted. Continue with twice a day PPI, Carafate, start  ultra soft diet. Would like to  see him tolerate the diet without any decrease in hemoglobin prior to discharge. Will start octreotide and ceftriaxone at this time.  2. Type 2 diabetes insulin requiring: Blood sugars controlled Continue SSI  3. Chronic lower extremity edema Appreciate wound care consultation and Unna boots  4. Hyperlipidemia unspecified: Statin therapy  5.Venous thromboembolism prophylactic: SCD  #6 chronic kidney disease stage III with baseline creatinine of 2 Renal function improving with gentle hydration  #63 blood loss anemia: He is status post transfusion of 2 units packed red blood cells. Hemoglobin now stable  All the records are reviewed and case discussed with Care Management/Social Workerr. Management plans discussed with the patient, family and they are in agreement.  CODE STATUS: Full   TOTAL TIME TAKING CARE OF THIS PATIENT: 35 minutes.   POSSIBLE D/C IN one DAYS, DEPENDING ON CLINICAL CONDITION.   Myrtis Ser M.D on 02/10/2015 at 2:08 PM  Between 7am to 6pm - Pager - [phone]  After 6pm go to [email or web address] - password [email or web address] [facility]  [place] [facility]  Office  [phone]  CC: Primary care physician; No primary care provider on file.

## 2015-02-10 NOTE — Progress Notes (Signed)
[place] at [place] NAME: Corey West    MR#:  [id number]  DATE OF BIRTH:  [birth year]  SUBJECTIVE:  No complaints this morning. No further vomiting. No abdominal pain. Would like to advance diet.  REVIEW OF SYSTEMS:  CONSTITUTIONAL: No fever, fatigue or weakness.  EYES: No blurred or double vision.  EARS, NOSE, AND THROAT: No tinnitus or ear pain.  RESPIRATORY: No cough, shortness of breath, wheezing or hemoptysis.  CARDIOVASCULAR: No chest pain, orthopnea, edema.  GASTROINTESTINAL: No nausea, vomiting, diarrhea or abdominal pain.  GENITOURINARY: No dysuria, hematuria.  ENDOCRINE: No polyuria, nocturia,  HEMATOLOGY: No anemia, easy bruising or bleeding SKIN: No rash or lesion. MUSCULOSKELETAL: No joint pain or arthritis.   NEUROLOGIC: No tingling, numbness, weakness.  PSYCHIATRY: No anxiety or depression.   DRUG ALLERGIES:  No Known Allergies  VITALS:  Blood pressure 114/42, pulse 52, temperature 97.3 F (36.3 C), temperature source Oral, resp. rate 20, height 5\' 7"  (1.702 m), weight 138.2 kg (304 lb 10.8 oz), SpO2 100 %.  PHYSICAL EXAMINATION:  GENERAL:  64 y.o.-year-old patient lying in the bed with no acute distress. Obese EYES: Pupils equal, round, reactive to light and accommodation. No scleral icterus. EOMI  HEENT: Head atraumatic, normocephalic. Oropharynx and nasopharynx clear.  NECK:  Supple, no jugular venous distention. No thyroid enlargement, no tenderness.  LUNGS: Normal breath sounds bilaterally, no wheezing, rales,rhonchi or crepitation. No use of accessory muscles of respiration.  CARDIOVASCULAR: S1, S2 normal. No murmurs, rubs, or gallops.  ABDOMEN: Soft, nontender, nondistended. Bowel sounds present. No organomegaly or mass.  EXTREMITIES: No pedal edema, cyanosis, or clubbing.  NEUROLOGIC: Cranial nerves II through XII are intact. Muscle strength 5/5 in all extremities. Sensation intact. Gait not checked.   PSYCHIATRIC: The patient is alert and oriented x 3.  SKIN: No obvious rash, lesion, or ulcer.    LABORATORY PANEL:   CBC  Recent Labs Lab 02/10/15 0612  WBC 11.5*  HGB 10.3*  HCT 30.0*  PLT 76*   ------------------------------------------------------------------------------------------------------------------  Chemistries   Recent Labs Lab 02/09/15 0342 02/10/15 0612  NA 134* 133*  K 3.7 3.9  CL 98* 99*  CO2 26 30  GLUCOSE 145* 201*  BUN 26* 28*  CREATININE 2.36* 2.17*  CALCIUM 9.2 8.4*  MG  --  1.8  AST 20  --   ALT 15*  --   ALKPHOS 45  --   BILITOT 1.1  --    ------------------------------------------------------------------------------------------------------------------  Cardiac Enzymes No results for input(s): TROPONINI in the last 168 hours. ------------------------------------------------------------------------------------------------------------------  RADIOLOGY:  Dg Chest Portable 1 View  02/08/2015   CLINICAL DATA:  Gastrointestinal bleeding. Vomiting. Weakness and vomiting beginning today at noon. Initial encounter.  EXAM: PORTABLE CHEST - 1 VIEW  COMPARISON:  09/10/2014.  FINDINGS: Cardiopericardial silhouette is enlarged for projection. Low volumes are present. RIGHT basilar atelectasis. No airspace disease or effusion.  IMPRESSION: Cardiomegaly without failure.   Electronically Signed   By: Dereck Ligas M.D.   On: 02/08/2015 22:24    EKG:   Orders placed or performed during the hospital encounter of 02/08/15  . EKG 12-Lead  . EKG 12-Lead    ASSESSMENT AND PLAN:    64 year old gentleman with history of cirrhosis with esophageal varices is anything with hematemesis.  1. Hematemesis: Patient with history of cirrhosis and recent Mallory-Weiss tear December 2015. Appreciate gastroenterology consultation and EGD. Gastric ulcers, esophagitis and inflammation noted. Continue with twice a day PPI, Carafate, start  ultra soft diet. Would like to  see him tolerate the diet without any decrease in hemoglobin prior to discharge. Will start octreotide and ceftriaxone at this time.  2. Type 2 diabetes insulin requiring: Blood sugars controlled Continue SSI  3. Chronic lower extremity edema Appreciate wound care consultation and Unna boots  4. Hyperlipidemia unspecified: Statin therapy  5.Venous thromboembolism prophylactic: SCD  #6 chronic kidney disease stage III with baseline creatinine of 2 Renal function improving with gentle hydration  #63 blood loss anemia: He is status post transfusion of 2 units packed red blood cells. Hemoglobin now stable  All the records are reviewed and case discussed with Care Management/Social Workerr. Management plans discussed with the patient, family and they are in agreement.  CODE STATUS: Full   TOTAL TIME TAKING CARE OF THIS PATIENT: 35 minutes.   POSSIBLE D/C IN one DAYS, DEPENDING ON CLINICAL CONDITION.   Myrtis Ser M.D on 02/10/2015 at 1:03 PM  Between 7am to 6pm - Pager - [phone]  After 6pm go to [email or web address] - password [email or web address] [facility]  [place] [facility]  Office  [phone]  CC: Primary care physician; No primary care provider on file.

## 2015-02-11 LAB — CBC
HEMATOCRIT: 29.9 % — AB (ref 40.0–52.0)
Hemoglobin: 10.5 g/dL — ABNORMAL LOW (ref 13.0–18.0)
MCH: 32.5 pg (ref 26.0–34.0)
MCHC: 35 g/dL (ref 32.0–36.0)
MCV: 92.8 fL (ref 80.0–100.0)
Platelets: 85 10*3/uL — ABNORMAL LOW (ref 150–440)
RBC: 3.23 MIL/uL — ABNORMAL LOW (ref 4.40–5.90)
RDW: 14.5 % (ref 11.5–14.5)
WBC: 9.3 10*3/uL (ref 3.8–10.6)

## 2015-02-11 LAB — TYPE AND SCREEN
ABO/RH(D): A NEG
Antibody Screen: NEGATIVE
UNIT DIVISION: 0

## 2015-02-11 LAB — GLUCOSE, CAPILLARY: Glucose-Capillary: 226 mg/dL — ABNORMAL HIGH (ref 65–99)

## 2015-02-11 LAB — BASIC METABOLIC PANEL
Anion gap: 6 (ref 5–15)
BUN: 27 mg/dL — ABNORMAL HIGH (ref 6–20)
CHLORIDE: 97 mmol/L — AB (ref 101–111)
CO2: 29 mmol/L (ref 22–32)
CREATININE: 2.03 mg/dL — AB (ref 0.61–1.24)
Calcium: 8.5 mg/dL — ABNORMAL LOW (ref 8.9–10.3)
GFR calc Af Amer: 38 mL/min — ABNORMAL LOW (ref >60)
GFR calc non Af Amer: 33 mL/min — ABNORMAL LOW (ref >60)
Glucose, Bld: 188 mg/dL — ABNORMAL HIGH (ref 65–99)
Potassium: 4 mmol/L (ref 3.5–5.1)
SODIUM: 132 mmol/L — AB (ref 135–145)

## 2015-02-11 MED ORDER — GELOCAST UNNAS BOOT EX MISC
2.0000 | CUTANEOUS | Status: DC
Start: 2015-02-11 — End: 2015-07-20

## 2015-02-11 MED ORDER — SUCRALFATE 1 GM/10ML PO SUSP: 1.0000 g | Freq: Three times a day (TID) | ORAL | Status: DC

## 2015-02-11 MED ORDER — PANTOPRAZOLE SODIUM 40 MG PO TBEC: 40.0000 mg | DELAYED_RELEASE_TABLET | Freq: Two times a day (BID) | ORAL | Status: DC

## 2015-02-11 NOTE — Consult Note (Signed)
Corey West is a 64 y.o. male  [id number]  Primary Cardiologist: Neoma Laming Reason for Consultation: Bradycardia  HPI: This is a 64 year old white male with   history of cirrhosis and esophageal varices and and hematemesis. He basically presented with throwing up blood which was bright red in the emergency room. He was seen by Dr. Vira Agar was started on Protonix and has had cautery done. He's feeling much better. Incidentally it was noted that the patient had bradycardia with a heart rate in the 40s thus I was asked to evaluate the patient. Patient's states that he never felt dizziness or syncope.   Review of Systems: Review of Systems  Gastrointestinal: Positive for heartburn, nausea and vomiting.  All other systems reviewed and are negative.     Past Medical History  Diagnosis Date  . Cirrhosis     Medications Prior to Admission  Medication Sig Dispense Refill  . ammonium lactate (AMLACTIN) 12 % cream Apply 1 g topically 2 (two) times daily.    . Ferrous Sulfate 140 (45 FE) MG TBCR Take 1 tablet by mouth daily.    . furosemide (LASIX) 40 MG tablet Take 40 mg by mouth 2 (two) times daily.    . insulin aspart protamine- aspart (NOVOLOG MIX 70/30) (70-30) 100 UNIT/ML injection Inject 62-84 Units into the skin 2 (two) times daily with a meal. Pt uses 84 units with breakfast and 62 units with supper.    . lactulose (CHRONULAC) 10 GM/15ML solution Take 20 g by mouth 2 (two) times daily.    Marland Kitchen LORazepam (ATIVAN) 0.5 MG tablet Take 0.5 mg by mouth every 8 (eight) hours as needed for anxiety.    . metolazone (ZAROXOLYN) 2.5 MG tablet Take 2.5 mg by mouth every other day.    . nadolol (CORGARD) 40 MG tablet Take 40 mg by mouth daily.    Marland Kitchen omeprazole (PRILOSEC) 40 MG capsule Take 40 mg by mouth 2 (two) times daily.    Marland Kitchen oxyCODONE (OXY IR/ROXICODONE) 5 MG immediate release tablet Take 5 mg by mouth every 6 (six) hours as needed for severe pain.     . rifaximin (XIFAXAN) 550 MG TABS  tablet Take 550 mg by mouth 2 (two) times daily.    . simvastatin (ZOCOR) 40 MG tablet Take 40 mg by mouth at bedtime.    Marland Kitchen spironolactone (ALDACTONE) 25 MG tablet Take 25 mg by mouth daily.       Marland Kitchen GELOCAST UNNAS BOOT  2 each Apply externally Once per day on Mon Thu  . lactulose  20 g Oral BID  . ondansetron (ZOFRAN) IV  4 mg Intravenous 4 times per day  . pantoprazole (PROTONIX) IV  40 mg Intravenous Q12H  . pneumococcal 23 valent vaccine  0.5 mL Intramuscular Tomorrow-1000  . rifaximin  550 mg Oral BID  . simvastatin  40 mg Oral QHS  . sodium chloride  3 mL Intravenous Q12H  . spironolactone  25 mg Oral Daily  . sucralfate  1 g Oral TID WC & HS    Infusions: . sodium chloride 30 mL/hr at 02/10/15 2315    No Known Allergies  History   Social History  . Marital Status: Single    Spouse Name: N/A  . Number of Children: N/A  . Years of Education: N/A   Occupational History  . Not on file.   Social History Main Topics  . Smoking status: Current Some Day Smoker  . Smokeless tobacco: Never Used  .  Alcohol Use: No  . Drug Use: No  . Sexual Activity: Not on file   Other Topics Concern  . Not on file   Social History Narrative    Family History  Problem Relation Age of Onset  . Diabetes Other     PHYSICAL EXAM: Filed Vitals:   02/11/15 0851  BP: 126/50  Pulse: 47  Temp: 97.8 F (36.6 C)  Resp: 18     Intake/Output Summary (Last 24 hours) at 02/11/15 0940 Last data filed at 02/11/15 0653  Gross per 24 hour  Intake    999 ml  Output   2500 ml  Net  -1501 ml    General:  Well appearing. No respiratory difficulty HEENT: normal Neck: supple. no JVD. Carotids 2+ bilat; no bruits. No lymphadenopathy or thryomegaly appreciated. Cor: PMI nondisplaced. Regular rate & rhythm. No rubs, gallops or murmurs. Lungs: clear Abdomen: soft, nontender, nondistended. No hepatosplenomegaly. No bruits or masses. Good bowel sounds. Extremities: no cyanosis, clubbing,  rash, edema Neuro: alert & oriented x 3, cranial nerves grossly intact. moves all 4 extremities w/o difficulty. Affect pleasant.  ECG: EKG shows normal sinus rhythm at 81 bpm nonspecific ST-T changes .The monitor shows sinus bradycardia with heart rate 43 bpm.  Results for orders placed or performed during the hospital encounter of 02/08/15 (from the past 24 hour(s))  CBC     Status: Abnormal   Collection Time: 02/11/15  5:53 AM  Result Value Ref Range   WBC 9.3 3.8 - 10.6 K/uL   RBC 3.23 (L) 4.40 - 5.90 MIL/uL   Hemoglobin 10.5 (L) 13.0 - 18.0 g/dL   HCT 29.9 (L) 40.0 - 52.0 %   MCV 92.8 80.0 - 100.0 fL   MCH 32.5 26.0 - 34.0 pg   MCHC 35.0 32.0 - 36.0 g/dL   RDW 14.5 11.5 - 14.5 %   Platelets 85 (L) 150 - 440 K/uL   No results found.   ASSESSMENT AND PLAN: Patient has intermittent sinus bradycardia with close heart rate of around 43. When he presented to the hospital his heart rate was 81. Patient denies any syncopal episode or dizziness except for when he has elevated ammonia level. Advise discharging the patient with follow-up in the office on Tuesday at around 10:00 thank you very much for referral. In the office we will do event monitoring as well as echocardiogram to further evaluate the patient.  Tanea Moga A

## 2015-02-11 NOTE — Evaluation (Signed)
Physical Therapy Evaluation Patient Details Name: Corey West MRN: [id number] DOB: [birth year] Today's Date: 02/11/2015   History of Present Illness  Pt is a 64 y.o. male admitted with hematemesis and s/p EGD 02/09/15.  Pt with low HR during hospital stay (nursing reporting HR being in 40's).  Clinical Impression  Pt demonstrates steady independent functional mobility with [place] without loss of balance.  Pt scored 25/28 on Tinetti balance assessment indicating pt is at low risk for falls.  Pt's HR 55-70 bpm during session.  Pt does not demonstrate any acute hospital PT needs at this time.  Will complete PT order.  Recommend pt discharge home when medically appropriate.    Follow Up Recommendations No PT follow up    Equipment Recommendations       Recommendations for Other Services       Precautions / Restrictions Precautions Precautions: Fall Restrictions Weight Bearing Restrictions: No      Mobility  Bed Mobility Overal bed mobility: Independent                Transfers Overall transfer level: Modified independent Equipment used: Straight cane                Ambulation/Gait Ambulation/Gait assistance: Modified independent (Device/Increase time) Ambulation Distance (Feet): 200 Feet Assistive device: Straight cane       General Gait Details: increased BOS but steady  Stairs Stairs: Yes Stairs assistance: Supervision (2 SBA for safety in stairwell) Stair Management: One rail Right;Step to pattern;With cane Number of Stairs: 4 General stair comments: pt appearing with good safety and took his time  Wheelchair Mobility    Modified Rankin (Stroke Patients Only)       Balance                                 Standardized Balance Assessment Standardized Balance Assessment :  (Tinetti balance assessment:  25/28 score)           Pertinent Vitals/Pain Pain Assessment: No/denies pain  Vitals: at rest HR 55 bpm; O2 99% on room air; after  activity HR 70 bpm; O2 99% on room air.     Home Living Family/patient expects to be discharged to:: Private residence Living Arrangements: Alone   Type of Home:  (Condo) Home Access: Stairs to enter Entrance Stairs-Rails: Right Entrance Stairs-Number of Steps: 4 Home Layout: One level Home Equipment: [place] - single point;Walker - 2 wheels      Prior Function Level of Independence: Independent with assistive device(s)         Comments: uses      Hand Dominance        Extremity/Trunk Assessment   Upper Extremity Assessment: Overall WFL for tasks assessed           Lower Extremity Assessment: Overall WFL for tasks assessed         Communication   Communication: No difficulties  Cognition Arousal/Alertness: Awake/alert Behavior During Therapy: WFL for tasks assessed/performed Overall Cognitive Status: Within Functional Limits for tasks assessed                      General Comments General comments (skin integrity, edema, etc.): B Unna boots donned    Exercises        Assessment/Plan    PT Assessment Patent does not need any further PT services  PT Diagnosis     PT Problem List  PT Treatment Interventions     PT Goals (Current goals can be found in the Care Plan section) Acute Rehab PT Goals Patient Stated Goal: To go home PT Goal Formulation: With patient Time For Goal Achievement: 02/11/15 Potential to Achieve Goals: Good    Frequency     Barriers to discharge        Co-evaluation               End of Session Equipment Utilized During Treatment: Gait belt Activity Tolerance: Patient tolerated treatment well Patient left: in chair;with call bell/phone within reach Nurse Communication: Mobility status (Pt's HR vitals)         Time: [id number] PT Time Calculation (min) (ACUTE ONLY): 32 min   Charges:   PT Evaluation $Initial PT Evaluation Tier I: 1 Procedure     PT G CodesLeitha Bleak 02/11/2015, 3:25 PM [name], [place]

## 2015-02-11 NOTE — Consult Note (Signed)
WOC wound consult note Reason for Consult: reevaluate wound care to bilateral lower extremities.  Unna's boots.  Wound type:Venous stasis disease, chronic.  Dressing procedure/placement/frequency: patient to be discharged today.  Has Unna's boots in place. Understands to follow up with MD going forward for wound care needs/edema management. States he has been changing them weekly prior to admission.  Verbalizes understanding of need to follow up.  Will not follow at this time.  Please re-consult if needed.  Domenic Moras RN BSN [place] Pager [phone]

## 2015-02-11 NOTE — Consult Note (Signed)
Pt has blood loss anemia with acute UGI bleed due to ulcer disease seen on EGD

## 2015-02-11 NOTE — Progress Notes (Signed)
Pt A&OX4. IVF's infusing without difficulty. Pt continues SB 43-51. Cardio tech with call to check pt for bradycardia. Pt asymptomatic at this time. M.D. Aware per Day shift RN. Pt ambulates with cane. No pain noted. Call light and phone in reach. Monitoring continued per Doctor orders and unit protocol.

## 2015-02-11 NOTE — Discharge Instructions (Signed)
°  DIET:  Cardiac diet, Diabetic diet and SOFT FOODS until follow up with Dr. Tiffany Kocher  DISCHARGE CONDITION:  Fair  ACTIVITY:  Activity as tolerated  OXYGEN:  Home Oxygen: No.   Oxygen Delivery: room air  DISCHARGE LOCATION:   Gastritis, Adult Gastritis is soreness and puffiness (inflammation) of the lining of the stomach. If you do not get help, gastritis can cause bleeding and sores (ulcers) in the stomach. HOME CARE   Only take medicine as told by your doctor.  If you were given antibiotic medicines, take them as told. Finish the medicines even if you start to feel better.  Drink enough fluids to keep your pee (urine) clear or pale yellow.  Avoid foods and drinks that make your problems worse. Foods you may want to avoid include:  Caffeine or alcohol.  Chocolate.  Mint.  Garlic and onions.  Spicy foods.  Citrus fruits, including oranges, lemons, or limes.  Food containing tomatoes, including sauce, chili, salsa, and pizza.  Fried and fatty foods.  Eat small meals throughout the day instead of large meals. GET HELP RIGHT AWAY IF:   You have black or dark red poop (stools).  You throw up (vomit) blood. It may look like coffee grounds.  You cannot keep fluids down.  Your belly (abdominal) pain gets worse.  You have a fever.  You do not feel better after 1 week.  You have any other questions or concerns. MAKE SURE YOU:   Understand these instructions.  Will watch your condition.  Will get help right away if you are not doing well or get worse. Document Released: 03/05/2008 Document Revised: 12/10/2011 Document Reviewed: 10/31/2011 [facility] Patient Information 2015 [place], [place]. This information is not intended to replace advice given to you by your health care provider. Make sure you discuss any questions you have with your health care provider.

## 2015-02-11 NOTE — Clinical Documentation Improvement (Addendum)
  Query #1 "#72 blood loss anemia: He is status post transfusion of 2 units packed red blood cells. Hemoglobin now stable" is documented in progress note 02/10/15.  Patient admitted for hematemesis and has received 2 units of PRBCs.  Please clarify the documentation of "#73 blood loss anemia" as appropriate.  Query #2 "Appreciate gastroenterology consultation and EGD. Gastric ulcers, esophagitis and inflammation noted" is documented in the progress note 02/10/15.  If possible, please clarify the suspected, likely or known cause, including the acuity, of the patient's hematemesis.    Thank You, Erling Conte ,RN Clinical Documentation Specialist:  [phone] [facility]

## 2015-02-11 NOTE — Consult Note (Signed)
Pt walking in hall and no dizzyness.  No bleeding.  Nadolol held due to low heart rate.  I called in carafate to be dissolved in 1-2 oz of water and take tid before meals.  Will see in office 2-3 weeks.

## 2015-02-19 NOTE — Discharge Summary (Signed)
[place] [facility] [place] NAME: Corey West    MR#:  [id number]  DATE OF BIRTH:  11-03-1950  DATE OF ADMISSION:  02/08/2015 ADMITTING PHYSICIAN: Lytle Butte, MD  DATE OF DISCHARGE: 02/11/2015  4:43 PM  PRIMARY CARE PHYSICIAN: No primary care provider on file.    ADMISSION DIAGNOSIS:  Acute upper GI bleeding [K92.2]  DISCHARGE DIAGNOSIS:  Active Problems:   Hematemesis   Cirrhosis   Esophageal varices   Type 2 diabetes mellitus   Gastroesophageal reflux disease   Hyperlipidemia   SECONDARY DIAGNOSIS:   Past Medical History  Diagnosis Date  . Cirrhosis     HOSPITAL COURSE:   1. Hematemesis: Patient with history of cirrhosis and recent Mallory-Weiss tear December 2015. Appreciate gastroenterology consultation and EGD. Gastric ulcers, esophagitis and inflammation noted. He has tolerated a regular diet without any additional discomfort, bleeding or change in hemoglobin. Continue with twice a day PPI. Continue with Carafate and discharged on soft diet  2. Type 2 diabetes insulin requiring: Blood sugars well controlled on sliding scale insulin throughout the hospitalization.   3. Chronic lower extremity edema: Appreciate wound care consultation and Unna boots and he will follow up in wound care clinic  4. Hyperlipidemia unspecified: Statin therapy  #5 chronic kidney disease stage III with baseline creatinine of 2. Renal function back to baseline with gentle hydration  #6 blood loss anemia: He is status post transfusion of 2 units packed red blood cells. Hemoglobin now stable for greater than 24 hours prior to discharge  DISCHARGE CONDITIONS:   Stable  CONSULTS OBTAINED:    Gastroenterology Dr. Vira Agar  DRUG ALLERGIES:  No Known Allergies  DISCHARGE MEDICATIONS:   Discharge Medication List as of 02/11/2015  3:15 PM    START taking these medications   Details  pantoprazole (PROTONIX) 40 MG tablet  Take 1 tablet (40 mg total) by mouth 2 (two) times daily., Starting 02/11/2015, Until Discontinued, Print    sucralfate (CARAFATE) 1 GM/10ML suspension Take 10 mLs (1 g total) by mouth 4 (four) times daily -  with meals and at bedtime., Starting 02/11/2015, Until Discontinued, Print    Wound Dressings (GELOCAST UNNAS BOOT) MISC Apply 2 each topically 2 (two) times a week., Starting 02/11/2015, Until Discontinued, Normal      CONTINUE these medications which have NOT CHANGED   Details  ammonium lactate (AMLACTIN) 12 % cream Apply 1 g topically 2 (two) times daily., Until Discontinued, Historical Med    Ferrous Sulfate 140 (45 FE) MG TBCR Take 1 tablet by mouth daily., Until Discontinued, Historical Med    furosemide (LASIX) 40 MG tablet Take 40 mg by mouth 2 (two) times daily., Until Discontinued, Historical Med    insulin aspart protamine- aspart (NOVOLOG MIX 70/30) (70-30) 100 UNIT/ML injection Inject 62-84 Units into the skin 2 (two) times daily with a meal. Pt uses 84 units with breakfast and 62 units with supper., Until Discontinued, Historical Med    lactulose (CHRONULAC) 10 GM/15ML solution Take 20 g by mouth 2 (two) times daily., Until Discontinued, Historical Med    LORazepam (ATIVAN) 0.5 MG tablet Take 0.5 mg by mouth every 8 (eight) hours as needed for anxiety., Until Discontinued, Historical Med    metolazone (ZAROXOLYN) 2.5 MG tablet Take 2.5 mg by mouth every other day., Until Discontinued, Historical Med    oxyCODONE (OXY IR/ROXICODONE) 5 MG immediate release tablet Take 5 mg by mouth every 6 (six) hours  as needed for severe pain. , Until Discontinued, Historical Med    rifaximin (XIFAXAN) 550 MG TABS tablet Take 550 mg by mouth 2 (two) times daily., Until Discontinued, Historical Med    simvastatin (ZOCOR) 40 MG tablet Take 40 mg by mouth at bedtime., Until Discontinued, Historical Med    spironolactone (ALDACTONE) 25 MG tablet Take 25 mg by mouth daily., Until Discontinued,  Historical Med      STOP taking these medications     nadolol (CORGARD) 40 MG tablet      omeprazole (PRILOSEC) 40 MG capsule          DISCHARGE INSTRUCTIONS:    See AVS  If you experience worsening of your admission symptoms, develop shortness of breath, life threatening emergency, suicidal or homicidal thoughts you must seek medical attention immediately by calling 911 or calling your MD immediately  if symptoms less severe.  You Must read complete instructions/literature along with all the possible adverse reactions/side effects for all the Medicines you take and that have been prescribed to you. Take any new Medicines after you have completely understood and accept all the possible adverse reactions/side effects.   Please note  You were cared for by a hospitalist during your hospital stay. If you have any questions about your discharge medications or the care you received while you were in the hospital after you are discharged, you can call the unit and asked to speak with the hospitalist on call if the hospitalist that took care of you is not available. Once you are discharged, your primary care physician will handle any further medical issues. Please note that NO REFILLS for any discharge medications will be authorized once you are discharged, as it is imperative that you return to your primary care physician (or establish a relationship with a primary care physician if you do not have one) for your aftercare needs so that they can reassess your need for medications and monitor your lab values.    Today   CHIEF COMPLAINT:   Chief Complaint  Patient presents with  . Hematemesis    HISTORY OF PRESENT ILLNESS:  Corey West is a 64 y.o. male with a known history of cirrhosis with esophageal varices presented with hematemesis. He describes one day duration of hematemesis with multiple bouts of vomiting originally dark material now with some frank red blood. Despite this he  denies further symptoms including chest pain, shortness of breath, abdominal pain. Emergency department course: Case was discussed with gastroenterology, octreotide and Protonix started.   VITAL SIGNS:  Blood pressure 126/50, pulse 55, temperature 97.8 F (36.6 C), temperature source Oral, resp. rate 18, height 5\' 7"  (1.702 m), weight 138.2 kg (304 lb 10.8 oz), SpO2 99 %.  I/O:  No intake or output data in the 24 hours ending 02/19/15 1531  PHYSICAL EXAMINATION:  GENERAL: 64 y.o.-year-old patient lying in the bed with no acute distress. Obese EYES: Pupils equal, round, reactive to light and accommodation. No scleral icterus. EOMI  HEENT: Head atraumatic, normocephalic. Oropharynx and nasopharynx clear.  NECK: Supple, no jugular venous distention. No thyroid enlargement, no tenderness.  LUNGS: Normal breath sounds bilaterally, no wheezing, rales,rhonchi or crepitation. No use of accessory muscles of respiration.  CARDIOVASCULAR: S1, S2 normal. No murmurs, rubs, or gallops.  ABDOMEN: Soft, nontender, nondistended. Bowel sounds present. No organomegaly or mass.  EXTREMITIES: +2 pedal edema, cyanosis, or clubbing.  NEUROLOGIC: Cranial nerves II through XII are intact. Muscle strength 5/5 in all extremities. Sensation intact. Gait not  checked.  PSYCHIATRIC: The patient is alert and oriented x 3.  SKIN: No obvious rash, lesion, or ulcer. UNNA boots in place   DATA REVIEW:   CBC No results for input(s): WBC, HGB, HCT, PLT in the last 168 hours.  Chemistries  No results for input(s): NA, K, CL, CO2, GLUCOSE, BUN, CREATININE, CALCIUM, MG, AST, ALT, ALKPHOS, BILITOT in the last 168 hours.  Invalid input(s): GFRCGP  Cardiac Enzymes No results for input(s): TROPONINI in the last 168 hours.  Microbiology Results  Results for orders placed or performed in visit on 09/10/14  Culture, blood (single)     Status: None   Collection Time: 09/10/14  3:56 AM  Result Value Ref Range  Status   Micro Text Report   Final       COMMENT                   NO GROWTH AEROBICALLY/ANAEROBICALLY IN 5 DAYS   ANTIBIOTIC                                                      Culture, blood (single)     Status: None   Collection Time: 09/10/14  4:05 AM  Result Value Ref Range Status   Micro Text Report   Final       COMMENT                   NO GROWTH AEROBICALLY/ANAEROBICALLY IN 5 DAYS   ANTIBIOTIC                                                        RADIOLOGY:  No results found.  EKG:   Orders placed or performed during the hospital encounter of 02/08/15  . EKG 12-Lead  . EKG 12-Lead  . EKG      Management plans discussed with the patient, family and they are in agreement.  CODE STATUS: Full  TOTAL TIME TAKING CARE OF THIS PATIENT: 40 minutes.    Myrtis Ser M.D on 02/19/2015 at 3:31 PM  Between 7am to 6pm - Pager - [phone]  After 6pm [place] to [email or web address] - password [email or web address] [facility]  [facility]  Office  [phone]  CC: Primary care physician; No primary care provider on file.

## 2015-02-22 NOTE — Op Note (Signed)
[facility] Gastroenterology Patient Name: Corey West Procedure Date: 02/09/2015 12:57 PM MRN: [id number] Account #: [id number] Date of Birth: [birth year] Admit Type: Inpatient Age: 64 Room: [facility] ENDO ROOM 1 Gender: Male Note Status: Finalized Procedure:         Upper GI endoscopy Indications:       Hematemesis, Melena Providers:         Gerrit Heck. Rayann Heman, MD, Manya Silvas, MD Referring MD:      Hewitt Blade. Sarina Ser, MD (Referring MD) Medicines:         Propofol per Anesthesia Complications:     No immediate complications. Procedure:         Pre-Anesthesia Assessment:                    - After reviewing the risks and benefits, the patient was                     deemed in satisfactory condition to undergo the procedure.                    After obtaining informed consent, the endoscope was passed                     under direct vision. Throughout the procedure, the                     patient's blood pressure, pulse, and oxygen saturations                     were monitored continuously. The Olympus GIF-160 endoscope                     (S#. [id number]) was introduced through the mouth, and                     advanced to the second part of duodenum. The upper GI                     endoscopy was accomplished without difficulty. The patient                     tolerated the procedure well. Findings:      LA Grade B (one or more mucosal breaks greater than 5 mm, not extending       between the tops of two mucosal folds) esophagitis with no bleeding was       found 39 cm from the incisors.      One non-bleeding cratered gastric ulcer with pigmented material was       found on the greater curvature of the gastric body. The lesion was 6 mm       in largest dimension.      Multiple medium sessile polyps with no bleeding and stigmata of recent       bleeding were found in the gastric antrum.      The examined duodenum was normal. Impression:        - LA Grade B reflux  esophagitis.                    - Gastric ulcer.                    - Multiple gastric polyps.                    -  Normal examined duodenum.                    - No specimens collected. Recommendation:    - The findings and recommendations were discussed with the                     patient. Mellody Life, MD 02/22/2015 10:52:23 AM Manya Silvas, MD 02/09/2015 1:19:17 PM Number of Addenda: 0 Note Initiated On: 02/09/2015 12:57 PM      [facility]

## 2015-07-05 ENCOUNTER — Inpatient Hospital Stay (HOSPITAL_BASED_OUTPATIENT_CLINIC_OR_DEPARTMENT_OTHER): Payer: BLUE CROSS/BLUE SHIELD | Admitting: Hematology and Oncology

## 2015-07-05 ENCOUNTER — Inpatient Hospital Stay: Payer: BLUE CROSS/BLUE SHIELD | Attending: Hematology and Oncology

## 2015-07-05 ENCOUNTER — Inpatient Hospital Stay: Payer: BLUE CROSS/BLUE SHIELD

## 2015-07-05 VITALS — BP 151/75 | HR 76 | Temp 97.6°F | Ht 69.0 in | Wt 316.2 lb

## 2015-07-05 VITALS — BP 152/71 | HR 67 | Temp 97.3°F | Resp 18

## 2015-07-05 DIAGNOSIS — D696 Thrombocytopenia, unspecified: Secondary | ICD-10-CM | POA: Diagnosis not present

## 2015-07-05 DIAGNOSIS — R161 Splenomegaly, not elsewhere classified: Secondary | ICD-10-CM | POA: Diagnosis not present

## 2015-07-05 DIAGNOSIS — D631 Anemia in chronic kidney disease: Secondary | ICD-10-CM | POA: Diagnosis not present

## 2015-07-05 DIAGNOSIS — D649 Anemia, unspecified: Secondary | ICD-10-CM

## 2015-07-05 DIAGNOSIS — R0602 Shortness of breath: Secondary | ICD-10-CM | POA: Diagnosis not present

## 2015-07-05 DIAGNOSIS — K766 Portal hypertension: Secondary | ICD-10-CM | POA: Insufficient documentation

## 2015-07-05 DIAGNOSIS — D509 Iron deficiency anemia, unspecified: Secondary | ICD-10-CM | POA: Diagnosis present

## 2015-07-05 DIAGNOSIS — F17210 Nicotine dependence, cigarettes, uncomplicated: Secondary | ICD-10-CM | POA: Diagnosis not present

## 2015-07-05 DIAGNOSIS — E7220 Disorder of urea cycle metabolism, unspecified: Secondary | ICD-10-CM | POA: Diagnosis not present

## 2015-07-05 DIAGNOSIS — N189 Chronic kidney disease, unspecified: Secondary | ICD-10-CM

## 2015-07-05 DIAGNOSIS — Z79899 Other long term (current) drug therapy: Secondary | ICD-10-CM | POA: Insufficient documentation

## 2015-07-05 DIAGNOSIS — I8501 Esophageal varices with bleeding: Secondary | ICD-10-CM | POA: Diagnosis not present

## 2015-07-05 DIAGNOSIS — R531 Weakness: Secondary | ICD-10-CM | POA: Diagnosis not present

## 2015-07-05 DIAGNOSIS — K2270 Barrett's esophagus without dysplasia: Secondary | ICD-10-CM | POA: Insufficient documentation

## 2015-07-05 DIAGNOSIS — R5383 Other fatigue: Secondary | ICD-10-CM | POA: Insufficient documentation

## 2015-07-05 DIAGNOSIS — K7460 Unspecified cirrhosis of liver: Secondary | ICD-10-CM | POA: Insufficient documentation

## 2015-07-05 DIAGNOSIS — Z794 Long term (current) use of insulin: Secondary | ICD-10-CM | POA: Insufficient documentation

## 2015-07-05 DIAGNOSIS — R55 Syncope and collapse: Secondary | ICD-10-CM | POA: Insufficient documentation

## 2015-07-05 LAB — RETICULOCYTES
RBC.: 2.29 MIL/uL — ABNORMAL LOW (ref 4.40–5.90)
Retic Count, Absolute: 167.2 10*3/uL (ref 19.0–183.0)
Retic Ct Pct: 7.3 % — ABNORMAL HIGH (ref 0.4–3.1)

## 2015-07-05 LAB — CBC WITH DIFFERENTIAL/PLATELET
Basophils Absolute: 0.1 10*3/uL (ref 0–0.1)
Basophils Relative: 1 %
Eosinophils Absolute: 0.2 10*3/uL (ref 0–0.7)
Eosinophils Relative: 4 %
HCT: 21 % — ABNORMAL LOW (ref 40.0–52.0)
Hemoglobin: 7 g/dL — ABNORMAL LOW (ref 13.0–18.0)
Lymphocytes Relative: 20 %
Lymphs Abs: 1 10*3/uL (ref 1.0–3.6)
MCH: 30.7 pg (ref 26.0–34.0)
MCHC: 33.5 g/dL (ref 32.0–36.0)
MCV: 91.5 fL (ref 80.0–100.0)
Monocytes Absolute: 0.3 10*3/uL (ref 0.2–1.0)
Monocytes Relative: 6 %
Neutro Abs: 3.5 10*3/uL (ref 1.4–6.5)
Neutrophils Relative %: 69 %
Platelets: 110 10*3/uL — ABNORMAL LOW (ref 150–440)
RBC: 2.29 MIL/uL — ABNORMAL LOW (ref 4.40–5.90)
RDW: 15.4 % — ABNORMAL HIGH (ref 11.5–14.5)
WBC: 5 10*3/uL (ref 3.8–10.6)

## 2015-07-05 LAB — FERRITIN: Ferritin: 29 ng/mL (ref 24–336)

## 2015-07-05 LAB — VITAMIN B12: Vitamin B-12: 307 pg/mL (ref 180–914)

## 2015-07-05 LAB — IRON AND TIBC
Iron: 132 ug/dL (ref 45–182)
Saturation Ratios: 32 % (ref 17.9–39.5)
TIBC: 412 ug/dL (ref 250–450)
UIBC: 280 ug/dL

## 2015-07-05 LAB — TSH: TSH: 2.056 u[IU]/mL (ref 0.350–4.500)

## 2015-07-05 LAB — FOLATE: Folate: 12.2 ng/mL (ref >5.9)

## 2015-07-05 LAB — PREPARE RBC (CROSSMATCH)

## 2015-07-05 MED ORDER — SODIUM CHLORIDE 0.9 % IV SOLN
250.0000 mL | Freq: Once | INTRAVENOUS | Status: AC
  Administered 2015-07-05: 250 mL via INTRAVENOUS
  Filled 2015-07-05: qty 250

## 2015-07-05 MED ORDER — ACETAMINOPHEN 325 MG PO TABS
650.0000 mg | ORAL_TABLET | Freq: Once | ORAL | Status: AC
  Administered 2015-07-05: 650 mg via ORAL
  Filled 2015-07-05: qty 2

## 2015-07-05 MED ORDER — DIPHENHYDRAMINE HCL 25 MG PO CAPS
25.0000 mg | ORAL_CAPSULE | Freq: Once | ORAL | Status: AC
  Administered 2015-07-05: 25 mg via ORAL
  Filled 2015-07-05: qty 1

## 2015-07-05 NOTE — Progress Notes (Signed)
[facility] day:  07/05/2015  Chief Complaint: Corey West is a 64 y.o. male with anemia and thrombocytopenia who is seen for reassessment.  HPI: The patient has a history of hepatic cirrhosis with associated splenomegaly.  He has portal hypertension and splenomegaly (spleen 16.3 cm) noted on abdominal ultrasound in 11/2013. EGD in 05/2010 revealed grade II esophageal varices with bleeding and a long segment of Barrett's esophagus. In 08/2014, he had a Mallory-Weiss tear.  He was admitted to [facility] on 02/08/2015 with hematemesis. EGD revealed gastric ulcers, esophagitis and inflammation. He was treated with twice a day proton pump inhibitors (PPIs) as well as Carafate.  He was transfused with 2 units of packed red blood cells.  Because of an elevated ammonia, the patient is on lactulose chronically.  Patient has documented iron deficiency anemia. He states that he is been on oral iron (one a day) for a few years. With the recent labs from 06/24/2015, Dr. Gilford Rile put him on twice a day iron. Labs at that time included a hematocrit of 23.2 , hemoglobin of 7.5, and MCV 94.3.  He is scheduled for a follow-up EGD with Dr. Lissa Merlin on 07/20/2015.  In addition to iron deficiency anemia, he has anemia of chronic renal disease. Creatinine has ranged between 1.9 and 2.4 (CrCl 27-38 ml/min).  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 Workup on 01/12/2014 and revealed a normal B12, folate, LDH, haptoglobin, direct Coombs, ANA and SPEP.  Ferritin was 8 with a saturation of 9%. Labs from 07/19/2014 revealed a normal free light chain ratio (1.8) with kappa free light chains of 92.61 and lambda free light chains of 49.05.  Symptomatically, he feels "real tired and really weak". He describes being presyncopal. He notes a little bit of shortness of breath. He denies any hematochezia but notes that his  stools are dark as he is on oral iron. His diet is described as "not much good" as he is single and lives by himself.  Past Medical History  Diagnosis Date  . Cirrhosis     Past Surgical History  Procedure Laterality Date  . Tonsillectomy and adenoidectomy    . Uvulopalatopharyngoplasty    . Ulnar nerve transposition    . Esophagogastroduodenoscopy N/A 02/09/2015    Procedure: ESOPHAGOGASTRODUODENOSCOPY (EGD);  Surgeon: Manya Silvas, MD;  Location: [facility] ENDOSCOPY;  Service: Endoscopy;  Laterality: N/A;    Family History  Problem Relation Age of Onset  . Diabetes Other     Social History:  reports that he has been smoking.  He has never used smokeless tobacco. He reports that he does not drink alcohol or use illicit drugs.  He states that he is semi-retired.  He works in [employer].  The patient is alone today.  Allergies: No Known Allergies  Current Medications: Current Outpatient Prescriptions  Medication Sig Dispense Refill  . ammonium lactate (AMLACTIN) 12 % cream Apply 1 g topically 2 (two) times daily.    . Ferrous Sulfate 140 (45 FE) MG TBCR Take 1 tablet by mouth daily.    . furosemide (LASIX) 40 MG tablet Take 40 mg by mouth 2 (two) times daily.    . insulin aspart protamine- aspart (NOVOLOG MIX 70/30) (70-30) 100 UNIT/ML injection Inject 62-84 Units into the skin 2 (two) times daily with a meal. Pt uses 84 units with breakfast and 62 units with supper.    . lactulose ([place]) 10  GM/15ML solution Take 20 g by mouth 2 (two) times daily.    Marland Kitchen LORazepam (ATIVAN) 0.5 MG tablet Take 0.5 mg by mouth every 8 (eight) hours as needed for anxiety.    . metolazone (ZAROXOLYN) 2.5 MG tablet Take 2.5 mg by mouth every other day.    . oxyCODONE (OXY IR/ROXICODONE) 5 MG immediate release tablet Take 5 mg by mouth every 6 (six) hours as needed for severe pain.     . rifaximin (XIFAXAN) 550 MG TABS tablet Take 550 mg by mouth 2 (two) times daily.    . simvastatin (ZOCOR) 40 MG tablet  Take 40 mg by mouth at bedtime.    Marland Kitchen spironolactone (ALDACTONE) 25 MG tablet Take 25 mg by mouth daily.    . sucralfate (CARAFATE) 1 GM/10ML suspension Take 10 mLs (1 g total) by mouth 4 (four) times daily -  with meals and at bedtime. 420 mL 1  . pantoprazole (PROTONIX) 40 MG tablet Take 1 tablet (40 mg total) by mouth 2 (two) times daily. (Patient not taking: Reported on 07/05/2015) 60 tablet 1  . Wound Dressings (GELOCAST UNNAS BOOT) MISC Apply 2 each topically 2 (two) times a week. (Patient not taking: Reported on 07/05/2015) 2 each 0   No current facility-administered medications for this visit.   Facility-Administered Medications Ordered in Other Visits  Medication Dose Route Frequency Provider Last Rate Last Dose  . 0.9 %  sodium chloride infusion  250 mL Intravenous Once Lequita Asal, MD      . acetaminophen (TYLENOL) tablet 650 mg  650 mg Oral Once Lequita Asal, MD      . diphenhydrAMINE (BENADRYL) capsule 25 mg  25 mg Oral Once Lequita Asal, MD        Review of Systems:  GENERAL:  Feels tired and weak.  No fevers, sweats or weight loss. PERFORMANCE STATUS (ECOG):  1 HEENT:  No visual changes, runny nose, sore throat, mouth sores or tenderness. Lungs: Little shortness of breath.  No cough.  No hemoptysis. Cardiac:  No chest pain, palpitations, orthopnea, or PND. GI:  Stools dark on oral iron.  Lactulose causes frequent stools.  No nausea, vomiting, constipation, melena or hematochezia. GU:  No urgency, frequency, dysuria, or hematuria. Musculoskeletal:  No back pain.  No joint pain.  No muscle tenderness. Extremities:  No pain or swelling. Skin:  No rashes or skin changes. Neuro:  No headache, numbness or weakness, balance or coordination issues. Endocrine:  No diabetes, thyroid issues, hot flashes or night sweats. Psych:  No mood changes, depression or anxiety. Pain:  No focal pain. Review of systems:  All other systems reviewed and found to be  negative.  Physical Exam: Blood pressure 151/75, pulse 76, temperature 97.6 F (36.4 C), temperature source Oral, height 5\' 9"  (1.753 m), weight 316 lb 4 oz (143.45 kg), SpO2 100 %. GENERAL:  Well developed, well nourished, overweight gentleman sitting comfortably in the exam room in no acute distress.  He has a cane at his side. MENTAL STATUS:  Alert and oriented to person, place and time. HEAD:  Normocephalic, atraumatic, face symmetric, no Cushingoid features. EYES:  Glasses.  Blue eyes.  Pupils equal round and reactive to light and accomodation.  No conjunctivitis or scleral icterus. ENT:  Oropharynx clear without lesion.  Tongue normal. Mucous membranes moist.  RESPIRATORY:  Clear to auscultation without rales, wheezes or rhonchi. CARDIOVASCULAR:  Regular rate and rhythm without murmur, rub or gallop. ABDOMEN:  Fully round.  Soft, non-tender, with active bowel sounds, and no hepatosplenomegaly.  No masses. SKIN:  No rashes, ulcers or lesions. EXTREMITIES: Chronic lower extremity changes.  No tenderness.  No palpable cords. LYMPH NODES: No palpable cervical, supraclavicular, axillary or inguinal adenopathy  NEUROLOGICAL: Unremarkable. PSYCH:  Appropriate.  Office Visit on 07/05/2015  Component Date Value Ref Range Status  . Order Confirmation 07/05/2015 ORDER PROCESSED BY BLOOD BANK   Final  Appointment on 07/05/2015  Component Date Value Ref Range Status  . WBC 07/05/2015 5.0  3.8 - 10.6 K/uL Final  . RBC 07/05/2015 2.29* 4.40 - 5.90 MIL/uL Final  . Hemoglobin 07/05/2015 7.0* 13.0 - 18.0 g/dL Final  . HCT 07/05/2015 21.0* 40.0 - 52.0 % Final  . MCV 07/05/2015 91.5  80.0 - 100.0 fL Final  . MCH 07/05/2015 30.7  26.0 - 34.0 pg Final  . MCHC 07/05/2015 33.5  32.0 - 36.0 g/dL Final  . RDW 07/05/2015 15.4* 11.5 - 14.5 % Final  . Platelets 07/05/2015 110* 150 - 440 K/uL Final  . Neutrophils Relative % 07/05/2015 69   Final  . Neutro Abs 07/05/2015 3.5  1.4 - 6.5 K/uL Final  .  Lymphocytes Relative 07/05/2015 20   Final  . Lymphs Abs 07/05/2015 1.0  1.0 - 3.6 K/uL Final  . Monocytes Relative 07/05/2015 6   Final  . Monocytes Absolute 07/05/2015 0.3  0.2 - 1.0 K/uL Final  . Eosinophils Relative 07/05/2015 4   Final  . Eosinophils Absolute 07/05/2015 0.2  0 - 0.7 K/uL Final  . Basophils Relative 07/05/2015 1   Final  . Basophils Absolute 07/05/2015 0.1  0 - 0.1 K/uL Final  . Retic Ct Pct 07/05/2015 7.3* 0.4 - 3.1 % Final  . RBC. 07/05/2015 2.29* 4.40 - 5.90 MIL/uL Final  . Retic Count, Manual 07/05/2015 167.2  19.0 - 183.0 K/uL Final  . ABO/RH(D) 07/05/2015 PENDING   Incomplete  . Antibody Screen 07/05/2015 PENDING   Incomplete  . Sample Expiration 07/05/2015 07/08/2015   Final    Assessment:  BERKAY PIGNONE is a 64 y.o. male  with hepatic cirrhosis, portal hypertension, splenomegaly and associated thrombocytopenia.  Because of an elevated ammonia, he is on chronic lactulose.   EGD in 05/2010 revealed grade II esophageal varices with bleeding and a long segment of Barrett's esophagus. In 08/2014, he had a Mallory-Weiss tear.  He was admitted to [facility] on 02/08/2015 with hematemesis. EGD revealed gastric ulcers, esophagitis and inflammation.   Patient has documented iron deficiency anemia. He has been on oral iron (originally once a day, now twice a day) for a few years. With the recent labs from 06/24/2015, Dr. Gilford Rile put him on twice a day iron. Labs at that time included a hematocrit of 23.2 , hemoglobin of 7.5, and MCV 94.3.  He is scheduled for a follow-up EGD on 07/20/2015.  He has anemia of chronic renal disease. Creatinine has ranged between 1.9 and 2.4 (CrCl 27-38 ml/min).  Workup on 01/12/2014 and revealed a normal B12, folate, LDH, haptoglobin, direct Coombs, ANA and SPEP.  Ferritin was 8 with a saturation of 9%.  Labs from 07/19/2014 revealed a normal free light chain ratio (1.8) with kappa free light chains of 92.61 and lambda free  light chains of 49.05.  Hepatitis B surface antigen and hepatitis C antibody were negative in 08/2013.  Symptomatically, he feels weak and presyncopal. He notes a little bit of shortness of breath. He denies any hematochezia,  but notes that  his stools are dark as he is on oral iron. His diet is described as "not much good".  Plan: 1. Review entire medical history, diagnosis of cirrhosis, portal hypertension, splenomegaly, sequestration, GI bleeding and resultant iron deficiency.  Discuss diet.  Discuss chronic renal insufficiency and possible benefit from Procrit if iron stores are replete.  Discuss current symptoms and need for transfusion (patient agreeable). 2. Labs today:  CBC with diff, ferritin, iron studies, SPEP, FLCA, B12, folate, TSH, type and screen.  3. Preauth Procrit. 4. RTC tomorrow for transfusion 1-2 units of PRBCs. 5. RTC in 1 week for MD assessment, review of labs, and +/- intiation of Procrit.   Lequita Asal, MD  07/05/2015, 11:58 AM

## 2015-07-05 NOTE — Progress Notes (Signed)
Patient here as a referral from Dr. Tiffany Kocher for possible blood transfusion. Former patient of Dr. Inez Pilgrim. 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

## 2015-07-06 ENCOUNTER — Ambulatory Visit: Payer: BLUE CROSS/BLUE SHIELD

## 2015-07-06 ENCOUNTER — Telehealth: Payer: Self-pay | Admitting: *Deleted

## 2015-07-06 ENCOUNTER — Inpatient Hospital Stay: Payer: BLUE CROSS/BLUE SHIELD

## 2015-07-06 LAB — PROTEIN ELECTROPHORESIS, SERUM
A/G Ratio: 0.7 (ref 0.7–1.7)
Albumin ELP: 2.7 g/dL — ABNORMAL LOW (ref 2.9–4.4)
Alpha-1-Globulin: 0.3 g/dL (ref 0.0–0.4)
Alpha-2-Globulin: 0.6 g/dL (ref 0.4–1.0)
Beta Globulin: 1.1 g/dL (ref 0.7–1.3)
Gamma Globulin: 1.8 g/dL (ref 0.4–1.8)
Globulin, Total: 3.9 g/dL (ref 2.2–3.9)
Total Protein ELP: 6.6 g/dL (ref 6.0–8.5)

## 2015-07-06 LAB — KAPPA/LAMBDA LIGHT CHAINS
Kappa free light chain: 110.42 mg/L — ABNORMAL HIGH (ref 3.30–19.40)
Kappa, lambda light chain ratio: 1.84 — ABNORMAL HIGH (ref 0.26–1.65)
Lambda free light chains: 60.02 mg/L — ABNORMAL HIGH (ref 5.71–26.30)

## 2015-07-06 LAB — TYPE AND SCREEN
ABO/RH(D): A NEG
Antibody Screen: NEGATIVE
Unit division: 0
Unit division: 0

## 2015-07-06 NOTE — Telephone Encounter (Signed)
Wants records form yesterday sent over

## 2015-07-20 ENCOUNTER — Ambulatory Visit: Payer: BLUE CROSS/BLUE SHIELD | Admitting: Anesthesiology

## 2015-07-20 ENCOUNTER — Inpatient Hospital Stay
Admission: AD | Admit: 2015-07-20 | Discharge: 2015-07-22 | DRG: 300 | Disposition: A | Discharge status: Home / Self Care | Payer: BLUE CROSS/BLUE SHIELD | Source: Ambulatory Visit | Attending: Internal Medicine | Admitting: Internal Medicine

## 2015-07-20 ENCOUNTER — Encounter: Payer: Self-pay | Admitting: *Deleted

## 2015-07-20 ENCOUNTER — Encounter
Admission: AD | Disposition: A | Discharge status: Home / Self Care | Payer: Self-pay | Source: Ambulatory Visit | Attending: Internal Medicine

## 2015-07-20 DIAGNOSIS — F17210 Nicotine dependence, cigarettes, uncomplicated: Secondary | ICD-10-CM | POA: Diagnosis present

## 2015-07-20 DIAGNOSIS — I10 Essential (primary) hypertension: Secondary | ICD-10-CM | POA: Diagnosis present

## 2015-07-20 DIAGNOSIS — I8510 Secondary esophageal varices without bleeding: Secondary | ICD-10-CM | POA: Diagnosis present

## 2015-07-20 DIAGNOSIS — G4730 Sleep apnea, unspecified: Secondary | ICD-10-CM | POA: Diagnosis present

## 2015-07-20 DIAGNOSIS — K209 Esophagitis, unspecified: Secondary | ICD-10-CM | POA: Diagnosis present

## 2015-07-20 DIAGNOSIS — K922 Gastrointestinal hemorrhage, unspecified: Secondary | ICD-10-CM | POA: Diagnosis present

## 2015-07-20 DIAGNOSIS — F419 Anxiety disorder, unspecified: Secondary | ICD-10-CM | POA: Diagnosis present

## 2015-07-20 DIAGNOSIS — K317 Polyp of stomach and duodenum: Secondary | ICD-10-CM | POA: Diagnosis present

## 2015-07-20 DIAGNOSIS — Z794 Long term (current) use of insulin: Secondary | ICD-10-CM | POA: Diagnosis not present

## 2015-07-20 DIAGNOSIS — M1990 Unspecified osteoarthritis, unspecified site: Secondary | ICD-10-CM | POA: Diagnosis present

## 2015-07-20 DIAGNOSIS — R600 Localized edema: Secondary | ICD-10-CM | POA: Diagnosis present

## 2015-07-20 DIAGNOSIS — K210 Gastro-esophageal reflux disease with esophagitis: Secondary | ICD-10-CM | POA: Diagnosis present

## 2015-07-20 DIAGNOSIS — E119 Type 2 diabetes mellitus without complications: Secondary | ICD-10-CM | POA: Diagnosis present

## 2015-07-20 DIAGNOSIS — E785 Hyperlipidemia, unspecified: Secondary | ICD-10-CM | POA: Diagnosis present

## 2015-07-20 DIAGNOSIS — Q2733 Arteriovenous malformation of digestive system vessel: Secondary | ICD-10-CM | POA: Diagnosis not present

## 2015-07-20 DIAGNOSIS — K219 Gastro-esophageal reflux disease without esophagitis: Secondary | ICD-10-CM | POA: Diagnosis present

## 2015-07-20 DIAGNOSIS — F17200 Nicotine dependence, unspecified, uncomplicated: Secondary | ICD-10-CM | POA: Diagnosis present

## 2015-07-20 DIAGNOSIS — D649 Anemia, unspecified: Secondary | ICD-10-CM | POA: Diagnosis present

## 2015-07-20 DIAGNOSIS — F329 Major depressive disorder, single episode, unspecified: Secondary | ICD-10-CM | POA: Diagnosis present

## 2015-07-20 DIAGNOSIS — K449 Diaphragmatic hernia without obstruction or gangrene: Secondary | ICD-10-CM | POA: Diagnosis present

## 2015-07-20 DIAGNOSIS — K7460 Unspecified cirrhosis of liver: Secondary | ICD-10-CM | POA: Diagnosis present

## 2015-07-20 DIAGNOSIS — Z79899 Other long term (current) drug therapy: Secondary | ICD-10-CM

## 2015-07-20 HISTORY — DX: Anemia, unspecified: D64.9

## 2015-07-20 HISTORY — DX: Major depressive disorder, single episode, unspecified: F32.9

## 2015-07-20 HISTORY — DX: Anxiety disorder, unspecified: F41.9

## 2015-07-20 HISTORY — DX: Personal history of other diseases of the digestive system: Z87.19

## 2015-07-20 HISTORY — PX: ESOPHAGOGASTRODUODENOSCOPY (EGD) WITH PROPOFOL: SHX5813

## 2015-07-20 HISTORY — DX: Unspecified osteoarthritis, unspecified site: M19.90

## 2015-07-20 HISTORY — DX: Sleep apnea, unspecified: G47.30

## 2015-07-20 HISTORY — DX: Type 2 diabetes mellitus without complications: E11.9

## 2015-07-20 HISTORY — DX: Depression, unspecified: F32.A

## 2015-07-20 HISTORY — DX: Gastro-esophageal reflux disease without esophagitis: K21.9

## 2015-07-20 LAB — HEMOGLOBIN AND HEMATOCRIT, BLOOD
HCT: 19.9 % — ABNORMAL LOW (ref 40.0–52.0)
Hemoglobin: 6.5 g/dL — ABNORMAL LOW (ref 13.0–18.0)

## 2015-07-20 LAB — GLUCOSE, CAPILLARY
GLUCOSE-CAPILLARY: 204 mg/dL — AB (ref 65–99)
GLUCOSE-CAPILLARY: 280 mg/dL — AB (ref 65–99)
Glucose-Capillary: 245 mg/dL — ABNORMAL HIGH (ref 65–99)
Glucose-Capillary: 211 mg/dL — ABNORMAL HIGH (ref 65–99)

## 2015-07-20 LAB — CBC
HCT: 20.5 % — ABNORMAL LOW (ref 40.0–52.0)
Hemoglobin: 6.8 g/dL — ABNORMAL LOW (ref 13.0–18.0)
MCH: 30.7 pg (ref 26.0–34.0)
MCHC: 33.2 g/dL (ref 32.0–36.0)
MCV: 92.6 fL (ref 80.0–100.0)
PLATELETS: 92 10*3/uL — AB (ref 150–440)
RBC: 2.21 MIL/uL — AB (ref 4.40–5.90)
RDW: 16.3 % — AB (ref 11.5–14.5)
WBC: 4.5 10*3/uL (ref 3.8–10.6)

## 2015-07-20 LAB — PREPARE RBC (CROSSMATCH)

## 2015-07-20 SURGERY — ESOPHAGOGASTRODUODENOSCOPY (EGD) WITH PROPOFOL
Anesthesia: General

## 2015-07-20 MED ORDER — FENTANYL CITRATE (PF) 100 MCG/2ML IJ SOLN
INTRAMUSCULAR | Status: DC | PRN
  Administered 2015-07-20: 50 ug via INTRAVENOUS

## 2015-07-20 MED ORDER — LIDOCAINE HCL (PF) 2 % IJ SOLN
INTRAMUSCULAR | Status: DC | PRN
  Administered 2015-07-20: 80 mg

## 2015-07-20 MED ORDER — PROPOFOL 10 MG/ML IV BOLUS
INTRAVENOUS | Status: DC | PRN
  Administered 2015-07-20: 20 mg via INTRAVENOUS
  Administered 2015-07-20: 30 mg via INTRAVENOUS

## 2015-07-20 MED ORDER — NADOLOL 40 MG PO TABS
20.0000 mg | ORAL_TABLET | Freq: Every day | ORAL | Status: DC
Start: 2015-07-20 — End: 2015-07-22
  Administered 2015-07-20 – 2015-07-22 (×3): 20 mg via ORAL
  Filled 2015-07-20 (×3): qty 1

## 2015-07-20 MED ORDER — FUROSEMIDE 10 MG/ML IJ SOLN
INTRAMUSCULAR | Status: DC | PRN
  Administered 2015-07-20: 20 mg via INTRAVENOUS

## 2015-07-20 MED ORDER — ACETAMINOPHEN 650 MG RE SUPP: 650.0000 mg | Freq: Four times a day (QID) | RECTAL | Status: DC | PRN

## 2015-07-20 MED ORDER — ONDANSETRON HCL 4 MG PO TABS: 4.0000 mg | ORAL_TABLET | Freq: Four times a day (QID) | ORAL | Status: DC | PRN

## 2015-07-20 MED ORDER — SODIUM CHLORIDE 0.9 % IV SOLN
INTRAVENOUS | Status: DC
  Administered 2015-07-20: 1000 mL via INTRAVENOUS
  Administered 2015-07-22 (×2): via INTRAVENOUS

## 2015-07-20 MED ORDER — FENTANYL CITRATE (PF) 100 MCG/2ML IJ SOLN
INTRAMUSCULAR | Status: AC
  Filled 2015-07-20: qty 2

## 2015-07-20 MED ORDER — ACETAMINOPHEN 325 MG PO TABS: 650.0000 mg | ORAL_TABLET | Freq: Four times a day (QID) | ORAL | Status: DC | PRN

## 2015-07-20 MED ORDER — IPRATROPIUM-ALBUTEROL 0.5-2.5 (3) MG/3ML IN SOLN
3.0000 mL | Freq: Four times a day (QID) | RESPIRATORY_TRACT | Status: DC
  Administered 2015-07-20: 3 mL via RESPIRATORY_TRACT
  Filled 2015-07-20 (×2): qty 3

## 2015-07-20 MED ORDER — SPIRONOLACTONE 25 MG PO TABS
25.0000 mg | ORAL_TABLET | Freq: Every day | ORAL | Status: DC
  Administered 2015-07-20: 25 mg via ORAL
  Filled 2015-07-20: qty 1

## 2015-07-20 MED ORDER — FUROSEMIDE 40 MG PO TABS
40.0000 mg | ORAL_TABLET | Freq: Every day | ORAL | Status: DC
  Administered 2015-07-20 – 2015-07-22 (×3): 40 mg via ORAL
  Filled 2015-07-20 (×3): qty 1

## 2015-07-20 MED ORDER — PANTOPRAZOLE SODIUM 40 MG IV SOLR
40.0000 mg | Freq: Two times a day (BID) | INTRAVENOUS | Status: DC
  Administered 2015-07-20 – 2015-07-22 (×4): 40 mg via INTRAVENOUS
  Filled 2015-07-20 (×5): qty 40

## 2015-07-20 MED ORDER — IPRATROPIUM-ALBUTEROL 0.5-2.5 (3) MG/3ML IN SOLN
RESPIRATORY_TRACT | Status: AC
  Administered 2015-07-20: 3 mL via RESPIRATORY_TRACT
  Filled 2015-07-20: qty 3

## 2015-07-20 MED ORDER — SODIUM CHLORIDE 0.9 % IV SOLN
INTRAVENOUS | Status: AC
  Administered 2015-07-20 – 2015-07-21 (×2): via INTRAVENOUS

## 2015-07-20 MED ORDER — INSULIN ASPART 100 UNIT/ML ~~LOC~~ SOLN
0.0000 [IU] | Freq: Every day | SUBCUTANEOUS | Status: DC
  Administered 2015-07-20 – 2015-07-21 (×2): 2 [IU] via SUBCUTANEOUS
  Filled 2015-07-20 (×2): qty 2

## 2015-07-20 MED ORDER — RIFAXIMIN 550 MG PO TABS
550.0000 mg | ORAL_TABLET | Freq: Two times a day (BID) | ORAL | Status: DC
  Administered 2015-07-20 – 2015-07-22 (×5): 550 mg via ORAL
  Filled 2015-07-20 (×6): qty 1

## 2015-07-20 MED ORDER — LORAZEPAM 0.5 MG PO TABS: 0.5000 mg | ORAL_TABLET | Freq: Three times a day (TID) | ORAL | Status: DC | PRN

## 2015-07-20 MED ORDER — SODIUM CHLORIDE 0.9 % IV SOLN: INTRAVENOUS | Status: DC

## 2015-07-20 MED ORDER — FUROSEMIDE 40 MG PO TABS
40.0000 mg | ORAL_TABLET | Freq: Two times a day (BID) | ORAL | Status: DC
Start: 2015-07-20 — End: 2015-07-20

## 2015-07-20 MED ORDER — PROPOFOL 500 MG/50ML IV EMUL
INTRAVENOUS | Status: DC | PRN
  Administered 2015-07-20: 75 ug/kg/min via INTRAVENOUS

## 2015-07-20 MED ORDER — ONDANSETRON HCL 4 MG/2ML IJ SOLN: 4.0000 mg | Freq: Four times a day (QID) | INTRAMUSCULAR | Status: DC | PRN

## 2015-07-20 MED ORDER — MORPHINE SULFATE (PF) 2 MG/ML IV SOLN: 2.0000 mg | INTRAVENOUS | Status: DC | PRN

## 2015-07-20 MED ORDER — INSULIN ASPART 100 UNIT/ML ~~LOC~~ SOLN
0.0000 [IU] | Freq: Three times a day (TID) | SUBCUTANEOUS | Status: DC
  Administered 2015-07-20: 5 [IU] via SUBCUTANEOUS
  Administered 2015-07-20: 3 [IU] via SUBCUTANEOUS
  Administered 2015-07-21 (×2): 2 [IU] via SUBCUTANEOUS
  Administered 2015-07-21: 1 [IU] via SUBCUTANEOUS
  Administered 2015-07-22: 3 [IU] via SUBCUTANEOUS
  Administered 2015-07-22: 2 [IU] via SUBCUTANEOUS
  Filled 2015-07-20: qty 3
  Filled 2015-07-20 (×2): qty 2
  Filled 2015-07-20: qty 1
  Filled 2015-07-20: qty 5
  Filled 2015-07-20: qty 3
  Filled 2015-07-20: qty 2

## 2015-07-20 MED ORDER — SODIUM CHLORIDE 0.9 % IV SOLN: Freq: Once | INTRAVENOUS | Status: DC

## 2015-07-20 MED ORDER — MIDAZOLAM HCL 2 MG/2ML IJ SOLN
INTRAMUSCULAR | Status: AC
Start: 2015-07-20 — End: 2015-07-21
  Filled 2015-07-20: qty 4

## 2015-07-20 NOTE — Discharge Instructions (Addendum)
Patient to be admitted to hospital for transfusions and repeat EGD in 2 days.

## 2015-07-20 NOTE — Consult Note (Signed)
Pt with active bleeding on elective EGD today.  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  History of eating mixed nuts recently on a daily basis.  I told him to not eat anything crunchy when he begins to expand diet.  Will repeat his EGD Friday and monitor blood count until then.  To get 2 units of blood at least.

## 2015-07-20 NOTE — Anesthesia Preprocedure Evaluation (Signed)
Anesthesia Evaluation  Patient identified by MRN, date of birth, ID band Patient awake    Reviewed: Allergy & Precautions, H&P , NPO status , Patient's Chart, lab work & pertinent test results  Airway Mallampati: II  TM Distance: >3 FB Neck ROM: full    Dental  (+) Poor Dentition, Chipped   Pulmonary sleep apnea , Current Smoker,    Pulmonary exam normal breath sounds clear to auscultation       Cardiovascular negative cardio ROS Normal cardiovascular exam Rhythm:regular Rate:Normal     Neuro/Psych PSYCHIATRIC DISORDERS Anxiety Depression negative neurological ROS     GI/Hepatic negative GI ROS, Neg liver ROS, hiatal hernia, GERD  Controlled,  Endo/Other  diabetes, Poorly Controlled, Type 2, Insulin Dependent  Renal/GU negative Renal ROS  negative genitourinary   Musculoskeletal  (+) Arthritis ,   Abdominal   Peds  Hematology negative hematology ROS (+)   Anesthesia Other Findings Past Medical History:   Cirrhosis ([place])                                              Diabetes mellitus without complication (HCC)                 Sleep apnea                                                  Depression                                                   Anxiety                                                      GERD (gastroesophageal reflux disease)                       History of hiatal hernia                                     Arthritis                                                    Anemia                                                      Past Surgical History:   TONSILLECTOMY AND ADENOIDECTOMY                               UVULOPALATOPHARYNGOPLASTY  ULNAR NERVE TRANSPOSITION                                     ESOPHAGOGASTRODUODENOSCOPY                      N/A 02/09/2015      Comment:Procedure: ESOPHAGOGASTRODUODENOSCOPY (EGD);                Surgeon: Manya Silvas,  MD;  Location: [facility]               ENDOSCOPY;  Service: Endoscopy;  Laterality:               N/A;   TONSILLECTOMY                                                   Reproductive/Obstetrics negative OB ROS                             Anesthesia Physical Anesthesia Plan  ASA: III  Anesthesia Plan: General   Post-op Pain Management:    Induction:   Airway Management Planned:   Additional Equipment:   Intra-op Plan:   Post-operative Plan:   Informed Consent: I have reviewed the patients History and Physical, chart, labs and discussed the procedure including the risks, benefits and alternatives for the proposed anesthesia with the patient or authorized representative who has indicated his/her understanding and acceptance.   Dental Advisory Given  Plan Discussed with: Anesthesiologist, CRNA and Surgeon  Anesthesia Plan Comments:         Anesthesia Quick Evaluation

## 2015-07-20 NOTE — H&P (Signed)
[place] [facility] [place] NAME: Corey West    MR#:  [id number]  DATE OF BIRTH:  08/31/1951  DATE OF ADMISSION:  07/20/2015  PRIMARY CARE PHYSICIAN: Madelyn Brunner, MD   REQUESTING/REFERRING PHYSICIAN: dr.Ellott  CHIEF COMPLAINT:   GI bleed HISTORY OF PRESENT ILLNESS:  Corey West  is a 64 y.o. male with a known history of liver cirrhosis, diabetes mellitus, GERD and chronic pedal edema came into the hospital and had EGD by Dr. Vira Agar this a.m. 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 Patient is reporting exertional dyspnea but denies any chest discomfort or shortness of breath while resting. Has chronic pedal edema but denies any history of congestive heart failure.  PAST MEDICAL HISTORY:   Past Medical History  Diagnosis Date  . Cirrhosis ([place])   . Diabetes mellitus without complication ([place])   . Sleep apnea   . Depression   . Anxiety   . GERD (gastroesophageal reflux disease)   . History of hiatal hernia   . Arthritis   . Anemia     PAST SURGICAL HISTOIRY:   Past Surgical History  Procedure Laterality Date  . Tonsillectomy and adenoidectomy    . Uvulopalatopharyngoplasty    . Ulnar nerve transposition    . Esophagogastroduodenoscopy N/A 02/09/2015    Procedure: ESOPHAGOGASTRODUODENOSCOPY (EGD);  Surgeon: Manya Silvas, MD;  Location: [facility] ENDOSCOPY;  Service: Endoscopy;  Laterality: N/A;  . Tonsillectomy      SOCIAL HISTORY:   Social History  Substance Use Topics  . Smoking status: Current Some Day Smoker  . Smokeless tobacco: Never Used  . Alcohol Use: No    FAMILY HISTORY:   Family History  Problem Relation Age of Onset  . Diabetes Other     DRUG ALLERGIES:  No Known Allergies  REVIEW OF SYSTEMS:   CONSTITUTIONAL: No fever, fatigue or weakness.  EYES: No blurred or double vision.  EARS, NOSE, AND THROAT: No tinnitus or ear pain.  RESPIRATORY: No cough, shortness of breath, wheezing or hemoptysis. Reporting exertional dyspnea CARDIOVASCULAR: No chest pain, orthopnea, edema.  GASTROINTESTINAL: No nausea, vomiting, diarrhea or abdominal pain.  GENITOURINARY: No dysuria, hematuria.  ENDOCRINE: No polyuria, nocturia,  HEMATOLOGY: No anemia, easy bruising or bleeding SKIN: No rash or lesion. Reporting chronic pedal edema MUSCULOSKELETAL: No joint pain or arthritis.   NEUROLOGIC: No tingling, numbness, weakness.  PSYCHIATRY: No anxiety or depression.   MEDICATIONS AT HOME:   Prior to Admission medications   Medication Sig Start Date End Date Taking? Authorizing Provider  Ferrous Sulfate 140 (45 FE) MG TBCR Take 1 tablet by mouth daily.   Yes Historical Provider, MD  furosemide (LASIX) 40 MG tablet Take 40 mg by mouth 2 (two) times daily.   Yes Historical Provider, MD  insulin aspart protamine- aspart (NOVOLOG MIX 70/30) (70-30) 100 UNIT/ML injection Inject 62-84 Units into the skin 2 (two) times daily with a meal. Pt uses 84 units with breakfast and 62 units with supper.   Yes Historical Provider, MD  lactulose (CHRONULAC) 10 GM/15ML solution Take 20 g by mouth 2 (two) times daily.   Yes Historical Provider, MD  LORazepam (ATIVAN) 0.5 MG tablet Take 0.5 mg by mouth every 8 (eight) hours as needed for anxiety.   Yes Historical Provider, MD  metolazone (ZAROXOLYN) 2.5 MG tablet Take 2.5 mg by mouth every other day.   Yes Historical Provider, MD  nadolol ([place]) 40 MG tablet Take 20 mg by mouth daily.   Yes Historical Provider, MD  omeprazole (PRILOSEC) 40 MG capsule Take 40 mg by mouth 2 (two) times daily.   Yes Historical Provider, MD  rifaximin (XIFAXAN) 550 MG TABS tablet Take 550 mg by mouth 2 (two) times daily.   Yes Historical Provider, MD  simvastatin (ZOCOR) 40 MG tablet Take 40  mg by mouth at bedtime.   Yes Historical Provider, MD  spironolactone (ALDACTONE) 25 MG tablet Take 25 mg by mouth daily.   Yes Historical Provider, MD  sucralfate (CARAFATE) 1 GM/10ML suspension Take 10 mLs (1 g total) by mouth 4 (four) times daily -  with meals and at bedtime. 02/11/15  Yes Aldean Jewett, MD  traMADol (ULTRAM) 50 MG tablet Take by mouth every 6 (six) hours as needed.   Yes Historical Provider, MD      VITAL SIGNS:  Blood pressure 102/40, pulse 106, temperature 97.6 F (36.4 C), temperature source Oral, resp. rate 22, SpO2 100 %.  PHYSICAL EXAMINATION:  GENERAL:  64 y.o.-year-old patient lying in the bed with no acute distress. Morbidly obese EYES: Pupils equal, round, reactive to light and accommodation. No scleral icterus. Extraocular muscles intact.  HEENT: Head atraumatic, normocephalic. Oropharynx and nasopharynx clear.  NECK:  Supple, no jugular venous distention. No thyroid enlargement, no tenderness.  LUNGS: Normal breath sounds bilaterally, no wheezing, rales,rhonchi or crepitation. No use of accessory muscles of respiration.  CARDIOVASCULAR: S1, S2 normal. No murmurs, rubs, or gallops.  ABDOMEN: Soft, nontender, nondistended. Bowel sounds present. No organomegaly or mass.  EXTREMITIES: Chronic pedal edema with venous stasis, no cyanosis, or clubbing.  NEUROLOGIC: Cranial nerves II through XII are intact. Muscle strength 5/5 in all extremities. Sensation intact. Gait not checked.  PSYCHIATRIC: The patient is alert and oriented x 3.  SKIN: No obvious rash, lesion, or ulcer.   LABORATORY PANEL:   CBC  Recent Labs Lab 07/20/15 0817 07/20/15 1315  WBC 4.5  --   HGB 6.8* 6.5*  HCT 20.5* 19.9*  PLT 92*  --    ------------------------------------------------------------------------------------------------------------------  Chemistries  No results for input(s): NA, K, CL, CO2, GLUCOSE, BUN, CREATININE, CALCIUM, MG, AST, ALT, ALKPHOS, BILITOT in the last  168 hours.  Invalid input(s): GFRCGP ------------------------------------------------------------------------------------------------------------------  Cardiac Enzymes No results for input(s): TROPONINI in the last 168 hours. ------------------------------------------------------------------------------------------------------------------  RADIOLOGY:  No results found.  EKG:   Orders placed or performed during the hospital encounter of 02/08/15  . EKG 12-Lead  . EKG 12-Lead  . EKG    IMPRESSION AND PLAN:  Corey West  is a 64 y.o. male with a known history of liver cirrhosis, diabetes mellitus, GERD and chronic pedal edema came into the hospital and had EGD by Dr. Vira Agar this a.m. 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 Discussed with Dr. Vira Agar Patient will be kept on clear liquids, popsicles but no carbonated drinks or sodas   #2 symptomatically anemia with acute GI bleed   transfuse 2 units of PRBC Clear liquids and IV fluids GI prophylaxis with proton pump inhibitor Avoid NSAIDs and other anticoagulants  #3 insulin requiring diabetes mellitus Currently patient is on clear liquid diet Hold home medications Patient will be on sliding scale insulin  #4 hypertension Blood pressure is at low-normal side Hold  antihypertensives Provide hydration with IV fluids  #5 hyperlipidemia Hold statin  #6 history of chronic pedal edema Hold Spiriva lactone and Lasix doses cut down to 40 mg once daily with holding parameters  #7 history of liver cirrhosis with esophageal varices Holding lactulose Continue rifaximin   All the records are reviewed and case discussed with ED provider. Management plans discussed with the patient, family and they are in agreement.  CODE STATUS: Full code, niece  is the healthcare power of attorney  TOTAL CRITICAL CARE TIME TAKING CARE OF THIS PATIENT: 50 minutes.    [name] on 07/20/2015 at 4:55 PM  Between 7am to 6pm - Pager - [phone]  After 6pm go to [email or web address] - password [email or web address]  Office  [phone]  CC: Primary care physician; Madelyn Brunner, MD

## 2015-07-20 NOTE — Transfer of Care (Signed)
Immediate Anesthesia Transfer of Care Note  Patient: Corey West  Procedure(s) Performed: Procedure(s): ESOPHAGOGASTRODUODENOSCOPY (EGD) WITH PROPOFOL (N/A)  Patient Location: PACU  Anesthesia Type:General  Level of Consciousness: sedated  Airway & Oxygen Therapy: Patient Spontanous Breathing and Patient connected to nasal cannula oxygen  Post-op Assessment: Report given to RN and Post -op Vital signs reviewed and stable  Post vital signs: Reviewed and stable  Last Vitals:  Filed Vitals:   07/20/15 0857  BP:   Pulse:   Temp:   Resp: 0    Complications: No apparent anesthesia complications

## 2015-07-20 NOTE — Anesthesia Postprocedure Evaluation (Signed)
  Anesthesia Post-op Note  Patient: Corey West  Procedure(s) Performed: Procedure(s): ESOPHAGOGASTRODUODENOSCOPY (EGD) WITH PROPOFOL (N/A)  Anesthesia type:General  Patient location: PACU  Post pain: Pain level controlled  Post assessment: Post-op Vital signs reviewed, Patient's Cardiovascular Status Stable, Respiratory Function Stable, Patent Airway and No signs of Nausea or vomiting  Post vital signs: Reviewed and stable  Last Vitals:  Filed Vitals:   07/20/15 0950  BP: 147/61  Pulse: 74  Temp:   Resp: 17    Level of consciousness: awake, alert  and patient cooperative  Complications: No apparent anesthesia complications

## 2015-07-20 NOTE — Op Note (Signed)
[facility] Gastroenterology Patient Name: Corey West Procedure Date: 07/20/2015 8:29 AM MRN: [id number] Account #: [id number] Date of Birth: March 15, 1951 Admit Type: Outpatient Age: 64 Room: [facility] ENDO ROOM 3 Gender: Male Note Status: Finalized Procedure:         Upper GI endoscopy Indications:       Suspected upper gastrointestinal bleeding Providers:         Manya Silvas, MD Referring MD:      Eduard Clos. Gilford Rile, MD (Referring MD) Medicines:         Propofol per Anesthesia Complications:     No immediate complications. Procedure:         Pre-Anesthesia Assessment:                    - After reviewing the risks and benefits, the patient was                     deemed in satisfactory condition to undergo the procedure.                    After obtaining informed consent, the endoscope was passed                     under direct vision. Throughout the procedure, the                     patient's blood pressure, pulse, and oxygen saturations                     were monitored continuously. The Endoscope was introduced                     through the mouth, and advanced to the prepyloric region,                     stomach. The upper GI endoscopy was accomplished without                     difficulty. The patient tolerated the procedure well. 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 esophagitis with no       bleeding was found.      A single 12 mm sessile polyp with no bleeding and stigmata of recent       bleeding was found in the gastric body. The polyp was removed with a hot       snare. Resection and retrieval were complete.      Multiple large angioectasias with bleeding were found in the gastric       antrum. There was active bleeding from the antrum AVM's. Repeated       Coagulation for hemostasis using argon plasma at 0.4 liters/minute and       30 watts was successful. 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                     esophagitis.                    - A single gastric polyp. Resected and retrieved.                    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 Treated  with argon plasma coagulation (APC). Recommendation:    - Await pathology results. He needs to be admitted and                     transfused and repeat EGD in 2 days. Manya Silvas, MD 07/20/2015 9:40:00 AM This report has been signed electronically. Number of Addenda: 0 Note Initiated On: 07/20/2015 8:29 AM      [facility]

## 2015-07-20 NOTE — H&P (Signed)
Primary Care Physician:  Madelyn Brunner, MD Primary Gastroenterologist:  Dr. Manfred Shirts Pre-Procedure History & Physical: HPI:  Corey West is a 64 y.o. male is here for an endoscopy.  Follow up gastric ulcer and gastric polyp, esophageal varices.   Past Medical History  Diagnosis Date  . Cirrhosis ([place])   . Diabetes mellitus without complication ([place])   . Sleep apnea   . Depression   . Anxiety   . GERD (gastroesophageal reflux disease)   . History of hiatal hernia   . Arthritis   . Anemia     Past Surgical History  Procedure Laterality Date  . Tonsillectomy and adenoidectomy    . Uvulopalatopharyngoplasty    . Ulnar nerve transposition    . Esophagogastroduodenoscopy N/A 02/09/2015    Procedure: ESOPHAGOGASTRODUODENOSCOPY (EGD);  Surgeon: Manya Silvas, MD;  Location: [facility] [place] ENDOSCOPY;  Service: Endoscopy;  Laterality: N/A;  . Tonsillectomy      Prior to Admission medications   Medication Sig Start Date End Date Taking? Authorizing Provider  ammonium lactate (AMLACTIN) 12 % cream Apply 1 g topically 2 (two) times daily.    Historical Provider, MD  Ferrous Sulfate 140 (45 FE) MG TBCR Take 1 tablet by mouth daily.    Historical Provider, MD  furosemide (LASIX) 40 MG tablet Take 40 mg by mouth 2 (two) times daily.    Historical Provider, MD  insulin aspart protamine- aspart (NOVOLOG MIX 70/30) (70-30) 100 UNIT/ML injection Inject 62-84 Units into the skin 2 (two) times daily with a meal. Pt uses 84 units with breakfast and 62 units with supper.    Historical Provider, MD  lactulose (CHRONULAC) 10 GM/15ML solution Take 20 g by mouth 2 (two) times daily.    Historical Provider, MD  LORazepam (ATIVAN) 0.5 MG tablet Take 0.5 mg by mouth every 8 (eight) hours as needed for anxiety.    Historical Provider, MD  metolazone (ZAROXOLYN) 2.5 MG tablet Take 2.5 mg by mouth every other day.    Historical Provider, MD  oxyCODONE (OXY IR/ROXICODONE) 5 MG immediate release tablet Take 5  mg by mouth every 6 (six) hours as needed for severe pain.     Historical Provider, MD  pantoprazole (PROTONIX) 40 MG tablet Take 1 tablet (40 mg total) by mouth 2 (two) times daily. Patient not taking: Reported on 07/05/2015 02/11/15   Aldean Jewett, MD  rifaximin (XIFAXAN) 550 MG TABS tablet Take 550 mg by mouth 2 (two) times daily.    Historical Provider, MD  simvastatin (ZOCOR) 40 MG tablet Take 40 mg by mouth at bedtime.    Historical Provider, MD  spironolactone (ALDACTONE) 25 MG tablet Take 25 mg by mouth daily.    Historical Provider, MD  sucralfate (CARAFATE) 1 GM/10ML suspension Take 10 mLs (1 g total) by mouth 4 (four) times daily -  with meals and at bedtime. 02/11/15   Aldean Jewett, MD  Wound Dressings (GELOCAST UNNAS BOOT) MISC Apply 2 each topically 2 (two) times a week. Patient not taking: Reported on 07/05/2015 02/11/15   Aldean Jewett, MD    Allergies as of 06/30/2015  . (No Known Allergies)    Family History  Problem Relation Age of Onset  . Diabetes Other     Social History   Social History  . Marital Status: Single    Spouse Name: N/A  . Number of Children: N/A  . Years of Education: N/A   Occupational History  . Not on  file.   Social History Main Topics  . Smoking status: Current Some Day Smoker  . Smokeless tobacco: Never Used  . Alcohol Use: No  . Drug Use: No  . Sexual Activity: Not on file   Other Topics Concern  . Not on file   Social History Narrative    Review of Systems: See HPI, otherwise negative ROS  Physical Exam: BP 150/56 mmHg  Pulse 67  Temp(Src) 98.6 F (37 C) (Tympanic)  Resp 18  SpO2 100% General:   Alert,  pleasant and cooperative in NAD Head:  Normocephalic and atraumatic. Neck:  Supple; no masses or thyromegaly. Lungs:  Clear throughout to auscultation.    Heart:  Regular rate and rhythm. Abdomen:  Soft, nontender and nondistended. Normal bowel sounds, without guarding, and without rebound.  Very obese, no  HSM palpable. Neurologic:  Alert and  oriented x4;  grossly normal neurologically.  Impression/Plan: Corey West is here for an endoscopy to be performed for follow up gastric ulcer, gastric polyps and esophageal varices.  Risks, benefits, limitations, and alternatives regarding  endoscopy have been reviewed with the patient.  Questions have been answered.  All parties agreeable.   [name], MD  07/20/2015, 8:50 AM

## 2015-07-21 ENCOUNTER — Encounter: Payer: Self-pay | Admitting: Unknown Physician Specialty

## 2015-07-21 LAB — HEMOGLOBIN AND HEMATOCRIT, BLOOD
HEMATOCRIT: 23.3 % — AB (ref 40.0–52.0)
HEMOGLOBIN: 7.9 g/dL — AB (ref 13.0–18.0)

## 2015-07-21 LAB — COMPREHENSIVE METABOLIC PANEL
ALT: 14 U/L — AB (ref 17–63)
AST: 16 U/L (ref 15–41)
Albumin: 2.5 g/dL — ABNORMAL LOW (ref 3.5–5.0)
Alkaline Phosphatase: 53 U/L (ref 38–126)
Anion gap: 4 — ABNORMAL LOW (ref 5–15)
BUN: 19 mg/dL (ref 6–20)
CHLORIDE: 106 mmol/L (ref 101–111)
CO2: 26 mmol/L (ref 22–32)
CREATININE: 1.69 mg/dL — AB (ref 0.61–1.24)
Calcium: 8.2 mg/dL — ABNORMAL LOW (ref 8.9–10.3)
GFR, EST AFRICAN AMERICAN: 48 mL/min — AB (ref >60)
GFR, EST NON AFRICAN AMERICAN: 41 mL/min — AB (ref >60)
Glucose, Bld: 171 mg/dL — ABNORMAL HIGH (ref 65–99)
POTASSIUM: 3.7 mmol/L (ref 3.5–5.1)
SODIUM: 136 mmol/L (ref 135–145)
Total Bilirubin: 0.8 mg/dL (ref 0.3–1.2)
Total Protein: 6.1 g/dL — ABNORMAL LOW (ref 6.5–8.1)

## 2015-07-21 LAB — CBC
HCT: 22.4 % — ABNORMAL LOW (ref 40.0–52.0)
HCT: 26.6 % — ABNORMAL LOW (ref 40.0–52.0)
HEMOGLOBIN: 9.1 g/dL — AB (ref 13.0–18.0)
Hemoglobin: 7.5 g/dL — ABNORMAL LOW (ref 13.0–18.0)
MCH: 30.6 pg (ref 26.0–34.0)
MCH: 31.3 pg (ref 26.0–34.0)
MCHC: 33.6 g/dL (ref 32.0–36.0)
MCHC: 34.3 g/dL (ref 32.0–36.0)
MCV: 91 fL (ref 80.0–100.0)
MCV: 91.5 fL (ref 80.0–100.0)
PLATELETS: 99 10*3/uL — AB (ref 150–440)
Platelets: 111 10*3/uL — ABNORMAL LOW (ref 150–440)
RBC: 2.46 MIL/uL — AB (ref 4.40–5.90)
RBC: 2.91 MIL/uL — AB (ref 4.40–5.90)
RDW: 16.8 % — AB (ref 11.5–14.5)
RDW: 16 % — ABNORMAL HIGH (ref 11.5–14.5)
WBC: 6.4 10*3/uL (ref 3.8–10.6)
WBC: 6.4 10*3/uL (ref 3.8–10.6)

## 2015-07-21 LAB — GLUCOSE, CAPILLARY
GLUCOSE-CAPILLARY: 136 mg/dL — AB (ref 65–99)
GLUCOSE-CAPILLARY: 149 mg/dL — AB (ref 65–99)
GLUCOSE-CAPILLARY: 159 mg/dL — AB (ref 65–99)
GLUCOSE-CAPILLARY: 184 mg/dL — AB (ref 65–99)
GLUCOSE-CAPILLARY: 192 mg/dL — AB (ref 65–99)
GLUCOSE-CAPILLARY: 211 mg/dL — AB (ref 65–99)

## 2015-07-21 LAB — PROTIME-INR
INR: 1.23
PROTHROMBIN TIME: 15.7 s — AB (ref 11.4–15.0)

## 2015-07-21 LAB — MRSA PCR SCREENING: MRSA BY PCR: NEGATIVE

## 2015-07-21 LAB — PREPARE RBC (CROSSMATCH)

## 2015-07-21 LAB — SURGICAL PATHOLOGY

## 2015-07-21 LAB — HEMOGLOBIN A1C: Hgb A1c MFr Bld: 7.2 % — ABNORMAL HIGH (ref 4.0–6.0)

## 2015-07-21 MED ORDER — IPRATROPIUM-ALBUTEROL 0.5-2.5 (3) MG/3ML IN SOLN: 3.0000 mL | Freq: Four times a day (QID) | RESPIRATORY_TRACT | Status: DC | PRN

## 2015-07-21 MED ORDER — TRAMADOL HCL 50 MG PO TABS: 50.0000 mg | ORAL_TABLET | Freq: Four times a day (QID) | ORAL | Status: DC | PRN

## 2015-07-21 MED ORDER — SPIRONOLACTONE 25 MG PO TABS
25.0000 mg | ORAL_TABLET | Freq: Every day | ORAL | Status: DC
  Administered 2015-07-21 – 2015-07-22 (×2): 25 mg via ORAL
  Filled 2015-07-21 (×2): qty 1

## 2015-07-21 MED ORDER — SUCRALFATE 1 GM/10ML PO SUSP
1.0000 g | Freq: Three times a day (TID) | ORAL | Status: DC
  Administered 2015-07-21 – 2015-07-22 (×4): 1 g via ORAL
  Filled 2015-07-21 (×3): qty 10

## 2015-07-21 MED ORDER — SODIUM CHLORIDE 0.9 % IV SOLN
Freq: Once | INTRAVENOUS | Status: AC
  Administered 2015-07-21: 08:00:00 via INTRAVENOUS

## 2015-07-21 MED ORDER — SODIUM CHLORIDE 0.9 % IV SOLN: Freq: Once | INTRAVENOUS | Status: AC

## 2015-07-21 MED ORDER — POLYSACCHARIDE IRON COMPLEX 150 MG PO CAPS
150.0000 mg | ORAL_CAPSULE | Freq: Every day | ORAL | Status: DC
  Administered 2015-07-21: 150 mg via ORAL
  Filled 2015-07-21 (×2): qty 1

## 2015-07-21 NOTE — Progress Notes (Addendum)
[place] [facility] [place] NAME: Corey West    MR#:  [id number]  DATE OF BIRTH:  Aug 08, 1951  SUBJECTIVE:  Doing well. No vomiting. Tolerating CLD  REVIEW OF SYSTEMS:   Review of Systems  Constitutional: Negative for fever, chills and weight loss.  HENT: Negative for ear discharge, ear pain and nosebleeds.   Eyes: Negative for blurred vision, pain and discharge.  Respiratory: Negative for sputum production, shortness of breath, wheezing and stridor.   Cardiovascular: Negative for chest pain, palpitations, orthopnea and PND.  Gastrointestinal: Negative for nausea, vomiting, abdominal pain and diarrhea.  Genitourinary: Negative for urgency and frequency.  Musculoskeletal: Negative for back pain and joint pain.  Neurological: Positive for weakness. Negative for sensory change, speech change and focal weakness.  Psychiatric/Behavioral: Negative for depression and hallucinations. The patient is not nervous/anxious.    Tolerating Diet:CLD Tolerating PT: not yet started  DRUG ALLERGIES:  No Known Allergies  VITALS:  Blood pressure 109/57, pulse 53, temperature 97.5 F (36.4 C), temperature source Oral, resp. rate 20, height 5\' 9"  (1.753 m), weight 144.8 kg (319 lb 3.6 oz), SpO2 98 %.  PHYSICAL EXAMINATION:   Physical Exam  GENERAL:  64 y.o.-year-old patient lying in the bed with no acute distress. Morbid obesity EYES: Pupils equal, round, reactive to light and accommodation. No scleral icterus. Extraocular muscles intact.  HEENT: Head atraumatic, normocephalic. Oropharynx and nasopharynx clear.  NECK:  Supple, no jugular venous distention. No thyroid enlargement, no tenderness.  LUNGS: Normal breath sounds bilaterally, no wheezing, rales, rhonchi. No use of accessory muscles of respiration.  CARDIOVASCULAR: S1, S2 normal. No murmurs, rubs, or gallops.  ABDOMEN: Soft, nontender, nondistended. Bowel sounds present. No organomegaly or mass.   EXTREMITIES: No cyanosis, clubbing . Severe  Chronic edema b/l.    NEUROLOGIC: Cranial nerves II through XII are intact. No focal Motor or sensory deficits b/l.   PSYCHIATRIC: The patient is alert and oriented x 3.  SKIN: No obvious rash, lesion, or ulcer.    LABORATORY PANEL:   CBC  Recent Labs Lab 07/21/15 0434  WBC 6.4  HGB 7.5*  HCT 22.4*  PLT 99*    Chemistries   Recent Labs Lab 07/21/15 0434  NA 136  K 3.7  CL 106  CO2 26  GLUCOSE 171*  BUN 19  CREATININE 1.69*  CALCIUM 8.2*  AST 16  ALT 14*  ALKPHOS 47  BILITOT 0.8   ASSESSMENT AND PLAN:  64 y.o. male with a known history of liver cirrhosis, diabetes mellitus, GERD and chronic pedal edema came into the hospital and had EGD by Dr. Vira Agar this a.m. 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 -s/p EGD on 07/20/2015 which has revealed active bleeding from the AV malformation; status post polypectomy - transfused 2 units of blood on 07/21/15 -hgb 6.8--6.5--2 units PRBC--7.8--7.5 -transfuse 1 more unit -IV PPI Bid -Patient will be kept on clear liquid diet -repeat EGD in am  #2 symptomatically anemia with acute GI bleed GI prophylaxis with proton pump inhibitor Avoid NSAIDs and other anticoagulants  #3 insulin requiring diabetes mellitus Currently patient is on clear liquid diet Hold home medications  on sliding scale insulin  #4 hypertension Blood pressure is at low-normal side Hold antihypertensives  #5 hyperlipidemia Hold statin  #6 history of chronic pedal edema Cont spironolactone Holding lasix,metolazone   #7 history of  liver cirrhosis with esophageal varices Holding lactulose Continue rifaximin  Case discussed with Care Management/Social Worker. Management plans discussed with the patient and  in agreement.  CODE STATUS: full  DVT Prophylaxis: TEDS/SCD  TOTAL  TIME TAKING CARE OF THIS PATIENT:  40 minutes.  >50% time spent on counselling and coordination of care RN, pt and Dr Corey West  POSSIBLE D/C IN 1-2 DAYS, DEPENDING ON CLINICAL CONDITION.   Corey West M.D on 07/21/2015 at 7:59 AM  Between 7am to 6pm - Pager - [phone]  After 6pm go to [email or web address] - password [email or web address] [facility]  Office  [phone]  CC: Primary care physician; Madelyn Brunner, MD

## 2015-07-21 NOTE — Progress Notes (Signed)
Inpatient Diabetes Program Recommendations  AACE/ADA: New Consensus Statement on Inpatient Glycemic Control (2015)  Target Ranges:  Prepandial:   less than 140 mg/dL      Peak postprandial:   less than 180 mg/dL (1-2 hours)      Critically ill patients:  140 - 180 mg/dL   Review of Glycemic Control  Results for Corey West, Corey West (MRN [id number]) as of 07/21/2015 11:29  Ref. Range 07/20/2015 16:20 07/20/2015 20:18 07/21/2015 00:27 07/21/2015 04:01 07/21/2015 07:54  Glucose-Capillary Latest Ref Range: 65-99 mg/dL 280 (H) 211 (H) 159 (H) 149 (H) 136 (H)    Diabetes history: Type 2, A1C 7.7% on 06/24/15 Outpatient Diabetes medications: Novolog mix 70/30 insulin 84 units qam, 62units qpm Current orders for Inpatient glycemic control: Novolog 0-9 units tid, Novolog 0-5 units qhs  Inpatient Diabetes Program Recommendations: Since patient is only on clear liquids and will be NPO at midnight for a test tomorrow, consider changing Novolog correction to q4h and increase to moderate correction 0-15 units.   Patient takes 102 units of long acting insulin per day (70 % of 70/30 mix) at home.  Although blood sugars are currently well controlled, this patient will likely need basal insulin.  If blood sugars are consistently above 250mg  /dl despite correction insulin, consider starting basal insulin at 0.1unit/kg = 15 units Lantus insulin qhs.   Gentry Fitz, RN, BA, MHA, CDE Diabetes Coordinator Inpatient Diabetes Program  [phone] (Team Pager) [phone] ([place]) 07/21/2015 11:44 AM

## 2015-07-21 NOTE — Progress Notes (Signed)
Inpatient Diabetes Program Recommendations  AACE/ADA: New Consensus Statement on Inpatient Glycemic Control (2015)  Target Ranges:  Prepandial:   less than 140 mg/dL      Peak postprandial:   less than 180 mg/dL (1-2 hours)      Critically ill patients:  140 - 180 mg/dL   Review of Glycemic Control  Met with patient and his niece at the bedside.  Patient reports that he does indeed take his Novolog mix 70/30 insulin on a regular basis; 84 units qam and 64 units qpm.  He divides the doses "in half" so he is not giving all the insulin in one location- he rotates sites, arms and stomach for injections using a syringe and bottle. He does not skip insulin.    Niece states patient drinks regular sodas and juices and the patient reports that he likes to eat. Recommended patient switch to diet drinks and water to help improve blood sugar control and possibly decrease his insulin needs.  Gentry Fitz, RN, BA, MHA, CDE Diabetes Coordinator Inpatient Diabetes Program  [phone] (Team Pager) [phone] ([place]) 07/21/2015 12:34 PM

## 2015-07-21 NOTE — Clinical Documentation Improvement (Signed)
Internal Medicine Gastroenterology  Can the diagnosis of "symptomatic anemia with acute GI bleed" be further specified?   Acute blood loss anemia  Other  Clinically Undetermined  Document any associated diagnoses/conditions.  Supporting Information: Component      RBC Hemoglobin HCT  Latest Ref Rng      4.40 - 5.90 MIL/uL 13.0 - 18.0 g/dL 40.0 - 52.0 %  07/20/2015     8:17 AM 2.21 (L) 6.8 (L) 20.5 (L)  07/20/2015     1:15 PM  6.5 (L) 19.9 (L)  07/21/2015     12:50 AM  7.9 (L) 23.3 (L)  07/21/2015     4:34 AM 2.46 (L) 7.5 (L) 22.4 (L)     Please exercise your independent, professional judgment when responding. A specific answer is not anticipated or expected.  Thank you, Mateo Flow, RN [phone] Clinical Documentation Specialist

## 2015-07-22 ENCOUNTER — Inpatient Hospital Stay: Payer: BLUE CROSS/BLUE SHIELD | Admitting: Anesthesiology

## 2015-07-22 ENCOUNTER — Encounter
Admission: AD | Disposition: A | Discharge status: Home / Self Care | Payer: Self-pay | Source: Ambulatory Visit | Attending: Internal Medicine

## 2015-07-22 ENCOUNTER — Encounter: Payer: Self-pay | Admitting: Anesthesiology

## 2015-07-22 HISTORY — PX: ESOPHAGOGASTRODUODENOSCOPY: SHX5428

## 2015-07-22 LAB — TYPE AND SCREEN
ABO/RH(D): A NEG
ANTIBODY SCREEN: NEGATIVE
UNIT DIVISION: 0
UNIT DIVISION: 0
Unit division: 0

## 2015-07-22 LAB — GLUCOSE, CAPILLARY
GLUCOSE-CAPILLARY: 138 mg/dL — AB (ref 65–99)
Glucose-Capillary: 161 mg/dL — ABNORMAL HIGH (ref 65–99)
Glucose-Capillary: 172 mg/dL — ABNORMAL HIGH (ref 65–99)
Glucose-Capillary: 223 mg/dL — ABNORMAL HIGH (ref 65–99)

## 2015-07-22 SURGERY — EGD (ESOPHAGOGASTRODUODENOSCOPY)
Anesthesia: General

## 2015-07-22 MED ORDER — SODIUM CHLORIDE 0.9 % IJ SOLN: 10.0000 mL | INTRAMUSCULAR | Status: DC | PRN

## 2015-07-22 MED ORDER — SODIUM CHLORIDE 0.9 % IJ SOLN: 3.0000 mL | INTRAMUSCULAR | Status: DC | PRN

## 2015-07-22 MED ORDER — HEPARIN SOD (PORK) LOCK FLUSH 100 UNIT/ML IV SOLN
250.0000 [IU] | INTRAVENOUS | Status: DC | PRN
  Filled 2015-07-22: qty 3

## 2015-07-22 MED ORDER — HEPARIN SOD (PORK) LOCK FLUSH 100 UNIT/ML IV SOLN
500.0000 [IU] | Freq: Every day | INTRAVENOUS | Status: DC | PRN
  Filled 2015-07-22: qty 5

## 2015-07-22 MED ORDER — FENTANYL CITRATE (PF) 100 MCG/2ML IJ SOLN
INTRAMUSCULAR | Status: DC | PRN
  Administered 2015-07-22: 50 ug via INTRAVENOUS

## 2015-07-22 MED ORDER — LIDOCAINE HCL (PF) 2 % IJ SOLN
INTRAMUSCULAR | Status: DC | PRN
  Administered 2015-07-22: 80 mg

## 2015-07-22 MED ORDER — GLYCOPYRROLATE 0.2 MG/ML IJ SOLN
INTRAMUSCULAR | Status: DC | PRN
Start: 2015-07-22 — End: 2015-07-22
  Administered 2015-07-22: 0.2 mg via INTRAVENOUS

## 2015-07-22 MED ORDER — PROPOFOL 10 MG/ML IV BOLUS
INTRAVENOUS | Status: DC | PRN
  Administered 2015-07-22: 30 mg via INTRAVENOUS
  Administered 2015-07-22: 20 mg via INTRAVENOUS

## 2015-07-22 MED ORDER — PROPOFOL 500 MG/50ML IV EMUL
INTRAVENOUS | Status: DC | PRN
  Administered 2015-07-22: 75 ug/kg/min via INTRAVENOUS

## 2015-07-22 NOTE — Progress Notes (Signed)
   07/22/15 1626  Clinical Encounter Type  Visited With Patient  Visit Type Initial  Consult/Referral To Chaplain  Spiritual Encounters  Spiritual Needs Emotional  Stress Factors  Patient Stress Factors None identified  Chaplain rounded in the unit and offered a compassionate presence and listening ear to the patient. Patient expressed grief and loss regarding parents and brother.  Chaplain Amaia Lavallie A. Ceria Suminski Ext. [phone]

## 2015-07-22 NOTE — Consult Note (Signed)
Pt had EGD repeat today and showed no active bleeding and ulcerations where the cautery was done to stop the active bleeding from the AVM,s in the stomach antrum.  Will advance to full liquid diet and then can be discharged this afternoon or tomorrow morning.  He should follow up with me in office middle of next week.   Should be on mechanical very soft diet for the near future.

## 2015-07-22 NOTE — Progress Notes (Signed)
Inpatient Diabetes Program Recommendations  AACE/ADA: New Consensus Statement on Inpatient Glycemic Control (2015)  Target Ranges:  Prepandial:   less than 140 mg/dL      Peak postprandial:   less than 180 mg/dL (1-2 hours)      Critically ill patients:  140 - 180 mg/dL   Diabetes history: Type 2, A1C 7.2% on 07/21/15 Outpatient Diabetes medications: Novolog mix 70/30 insulin 84 units qam, 62units qpm Current orders for Inpatient glycemic control: Novolog 0-9 units tid, Novolog 0-5 units qhs  Inpatient Diabetes Program Recommendations: After patient's test today and if patient can resume eating increase Novolog correction insulin to moderate correction 0-15 units.     Patient takes 102 units of long acting insulin per day (70 % of 70/30 mix) at home. Although blood sugars are currently well controlled, this patient will likely need basal insulin.Consider starting basal insulin at 0.1unit/kg = 15 units Lantus insulin when he returns from his procedure and he begins eating.   Gentry Fitz, RN, BA, MHA, CDE Diabetes Coordinator Inpatient Diabetes Program  [phone] (Team Pager) [phone] ([place]) 07/22/2015 7:57 AM

## 2015-07-22 NOTE — Brief Op Note (Signed)
Report called to [place], RN is CCU.  Patient transferred to CCU 12 via CCU Bed.  F/U Appointment with Dr. Vira Agar at [facility] made for Wednesday, October 26th at 2:00 p.m..  This information given to patient and niece in writing.  Norlene Campbell, RN

## 2015-07-22 NOTE — Transfer of Care (Signed)
Immediate Anesthesia Transfer of Care Note  Patient: Corey West  Procedure(s) Performed: Procedure(s): ESOPHAGOGASTRODUODENOSCOPY (EGD) (N/A)  Patient Location: PACU  Anesthesia Type:General  Level of Consciousness: sedated  Airway & Oxygen Therapy: Patient Spontanous Breathing and Patient connected to nasal cannula oxygen  Post-op Assessment: Report given to RN and Post -op Vital signs reviewed and stable  Post vital signs: Reviewed and stable  Last Vitals:  Filed Vitals:   07/22/15 1147  BP: 120/52  Pulse: 56  Temp:   Resp: 23    Complications: No apparent anesthesia complications

## 2015-07-22 NOTE — Anesthesia Preprocedure Evaluation (Signed)
Anesthesia Evaluation  Patient identified by MRN, date of birth, ID band Patient awake    Reviewed: Allergy & Precautions, H&P , NPO status , Patient's Chart, lab work & pertinent test results  History of Anesthesia Complications Negative for: history of anesthetic complications  Airway Mallampati: III  TM Distance: >3 FB Neck ROM: full    Dental  (+) Poor Dentition, Chipped   Pulmonary neg shortness of breath, sleep apnea and Continuous Positive Airway Pressure Ventilation , neg COPD, neg recent URI, Current Smoker,    Pulmonary exam normal breath sounds clear to auscultation       Cardiovascular negative cardio ROS Normal cardiovascular exam Rhythm:regular Rate:Normal     Neuro/Psych PSYCHIATRIC DISORDERS Anxiety Depression negative neurological ROS     GI/Hepatic negative GI ROS, Neg liver ROS, hiatal hernia, GERD  Controlled,(+) Cirrhosis   Esophageal Varices    ,   Endo/Other  diabetes, Poorly Controlled, Type 2, Insulin DependentMorbid obesity  Renal/GU negative Renal ROS  negative genitourinary   Musculoskeletal  (+) Arthritis ,   Abdominal   Peds  Hematology  (+) Blood dyscrasia, anemia ,   Anesthesia Other Findings Past Medical History:   Cirrhosis ([place])                                              Diabetes mellitus without complication ([facility])                 Sleep apnea                                                  Depression                                                   Anxiety                                                      GERD (gastroesophageal reflux disease)                       History of hiatal hernia                                     Arthritis                                                    Anemia                                                      Past  Surgical History:   TONSILLECTOMY AND ADENOIDECTOMY                               UVULOPALATOPHARYNGOPLASTY                                      ULNAR NERVE TRANSPOSITION                                     ESOPHAGOGASTRODUODENOSCOPY                      N/A 02/09/2015      Comment:Procedure: ESOPHAGOGASTRODUODENOSCOPY (EGD);                Surgeon: Manya Silvas, MD;  Location: [facility]               ENDOSCOPY;  Service: Endoscopy;  Laterality:               N/A;   TONSILLECTOMY                                                   Reproductive/Obstetrics negative OB ROS                             Anesthesia Physical  Anesthesia Plan  ASA: III  Anesthesia Plan: General   Post-op Pain Management:    Induction:   Airway Management Planned:   Additional Equipment:   Intra-op Plan:   Post-operative Plan:   Informed Consent: I have reviewed the patients History and Physical, chart, labs and discussed the procedure including the risks, benefits and alternatives for the proposed anesthesia with the patient or authorized representative who has indicated his/her understanding and acceptance.   Dental Advisory Given  Plan Discussed with: Anesthesiologist, CRNA and Surgeon  Anesthesia Plan Comments:         Anesthesia Quick Evaluation

## 2015-07-22 NOTE — Op Note (Signed)
[facility] Gastroenterology Patient Name: Corey West Procedure Date: 07/22/2015 11:16 AM MRN: [id number] Account #: [id number] Date of Birth: 1951/01/31 Admit Type: Inpatient Age: 64 Room: [facility] ENDO ROOM 1 Gender: Male Note Status: Finalized Procedure:         Upper GI endoscopy Indications:       Recent gastrointestinal bleeding Providers:         Manya Silvas, MD Referring MD:      Hewitt Blade. Sarina Ser, MD (Referring MD) Medicines:         Propofol per Anesthesia Complications:     No immediate complications. Procedure:         Pre-Anesthesia Assessment:                    - After reviewing the risks and benefits, the patient was                     deemed in satisfactory condition to undergo the procedure.                    After obtaining informed consent, the endoscope was passed                     under direct vision. Throughout the procedure, the                     patient's blood pressure, pulse, and oxygen saturations                     were monitored continuously. The Endoscope was introduced                     through the mouth, and advanced to the second part of                     duodenum. The upper GI endoscopy was accomplished without                     difficulty. The patient tolerated the procedure well. Findings:      Esophagitis with no bleeding was found.      Multiple small angioectasias with no bleeding were found in the gastric       body and in the gastric antrum. Coagulation for tissue destruction using       argon plasma at 0.4 liters/minute and 30 watts was successful.      The areas that were treated 2 days ago were seen and were deeply       ulcerated but no bleeding and the edges looked good. Impression:        - Esophagitis.                    - Multiple non-bleeding angioectasias in the stomach.                     Treated with argon plasma coagulation (APC).                    - No specimens collected. Recommendation:     - The findings and recommendations were discussed with the                     patient. Manya Silvas, MD 07/22/2015 11:45:36 AM This report has been signed electronically. Number of Addenda: 0 Note  Initiated On: 07/22/2015 11:16 AM      [facility]

## 2015-07-22 NOTE — Discharge Summary (Signed)
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 Cirrhosis ([place])   . Diabetes mellitus without complication ([place])   . Sleep apnea   . Depression   . Anxiety   . GERD (gastroesophageal reflux disease)   . History of hiatal hernia   . Arthritis   . Anemia     HOSPITAL COURSE:  64 y.o. male with a known history of liver cirrhosis, diabetes mellitus, GERD and chronic pedal edema came into the hospital and had EGD by Dr. Vira Agar this a.m. 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    furosemide (LASIX) 40 MG tablet Take 40 mg by mouth 2 (two) times daily.    insulin aspart protamine- aspart (NOVOLOG MIX 70/30) (70-30) 100 UNIT/ML injection Inject 62-84 Units into the skin 2 (two) times daily with a meal. Pt uses 84 units with breakfast and 62 units with supper.    lactulose (CHRONULAC) 10 GM/15ML solution Take 20 g by mouth 2 (two) times daily.    LORazepam (ATIVAN) 0.5 MG tablet Take 0.5 mg by mouth every 8 (eight) hours as needed for anxiety.    metolazone (ZAROXOLYN) 2.5 MG tablet Take 2.5 mg by mouth every other day.    nadolol (CORGARD) 40 MG tablet Take 20 mg by mouth daily.    omeprazole (PRILOSEC) 40 MG capsule Take 40 mg by mouth 2 (two) times daily.    rifaximin (XIFAXAN) 550 MG TABS tablet Take 550 mg by mouth 2 (two) times daily.    simvastatin (ZOCOR) 40 MG tablet Take 40 mg by mouth at bedtime.    spironolactone (ALDACTONE) 25 MG tablet Take 25 mg by mouth daily.    sucralfate (CARAFATE) 1 GM/10ML suspension Take 10 mLs (1 g total) by mouth 4 (four)  times daily -  with meals and at bedtime. Qty: 420 mL, Refills: 1    traMADol (ULTRAM) 50 MG tablet Take by mouth every 6 (six) hours as needed.        If you experience worsening of your admission symptoms, develop shortness of breath, life threatening emergency, suicidal or homicidal thoughts you must seek medical attention immediately by calling 911 or calling your MD immediately  if symptoms less severe.  You Must read complete instructions/literature along with all the possible adverse reactions/side effects for all the  Medicines you take and that have been prescribed to you. Take any new Medicines after you have completely understood and accept all the possible adverse reactions/side effects.   Please note  You were cared for by a hospitalist during your hospital stay. If you have any questions about your discharge medications or the care you received while you were in the hospital after you are discharged, you can call the unit and asked to speak with the hospitalist on call if the hospitalist that took care of you is not available. Once you are discharged, your primary care physician will handle any further medical issues. Please note that NO REFILLS for any discharge medications will be authorized once you are discharged, as it is imperative that you return to your primary care physician (or establish a relationship with a primary care physician if you do not have one) for your aftercare needs so that they can reassess your need for medications and monitor your lab values. Today   SUBJECTIVE   No complaints  VITAL SIGNS:  Blood pressure 118/57, pulse 52, temperature 96.9 F (36.1 C), temperature source Tympanic, resp. rate 16, height 5\' 9"  (1.753 m), weight 144.8 kg (319 lb 3.6 oz), SpO2 100 %.  I/O:   Intake/Output Summary (Last 24 hours) at 07/22/15 1219 Last data filed at 07/22/15 1138  Gross per 24 hour  Intake   1976 ml  Output   1925 ml  Net     51 ml    PHYSICAL EXAMINATION:  GENERAL:  64 y.o.-year-old patient lying in the bed with no acute distress. obeses EYES: Pupils equal, round, reactive to light and accommodation. No scleral icterus. Extraocular muscles intact.  HEENT: Head atraumatic, normocephalic. Oropharynx and nasopharynx clear.  NECK:  Supple, no jugular venous distention. No thyroid enlargement, no tenderness.  LUNGS: Normal breath sounds bilaterally, no wheezing, rales,rhonchi or crepitation. No use of accessory muscles of respiration.  CARDIOVASCULAR: S1, S2 normal. No  murmurs, rubs, or gallops.  ABDOMEN: Soft, non-tender, non-distended. Bowel sounds present. No organomegaly or mass.  EXTREMITIES: chronic +++pedal edema, no cyanosis, or clubbing.  NEUROLOGIC: Cranial nerves II through XII are intact. Muscle strength 5/5 in all extremities. Sensation intact. Gait not checked.  PSYCHIATRIC: The patient is alert and oriented x 3.  SKIN: No obvious rash, lesion, or ulcer.   DATA REVIEW:   CBC   Recent Labs Lab 07/21/15 1923  WBC 6.4  HGB 9.1*  HCT 26.6*  PLT 111*    Chemistries   Recent Labs Lab 07/21/15 0434  NA 136  K 3.7  CL 106  CO2 26  GLUCOSE 171*  BUN 19  CREATININE 1.69*  CALCIUM 8.2*  AST 16  ALT 14*  ALKPHOS 53  BILITOT 0.8    Microbiology Results   Recent Results (from the past 240 hour(s))  MRSA PCR Screening     Status: None   Collection Time: 07/20/15 11:30 PM  Result Value Ref  Range Status   MRSA by PCR NEGATIVE NEGATIVE Final    Comment:        The GeneXpert MRSA Assay (FDA approved for NASAL specimens only), is one component of a comprehensive MRSA colonization surveillance program. It is not intended to diagnose MRSA infection nor to guide or monitor treatment for MRSA infections.     RADIOLOGY:  No results found.   Management plans discussed with the patient, family and they are in agreement.  CODE STATUS:     Code Status Orders        Start     Ordered   07/20/15 1246  Full code   Continuous     07/20/15 1245      TOTAL TIME TAKING CARE OF THIS PATIENT: 40 minutes.    Emmamae Mcnamara M.D on 07/22/2015 at 12:19 PM  Between 7am to 6pm - Pager - [phone] After 6pm go to [email or web address] - password [email or web address] [facility]  Office  [phone]  CC: Primary care physician; Madelyn Brunner, MD

## 2015-07-23 NOTE — Anesthesia Postprocedure Evaluation (Signed)
  Anesthesia Post-op Note  Patient: Corey West  Procedure(s) Performed: Procedure(s): ESOPHAGOGASTRODUODENOSCOPY (EGD) (N/A)  Anesthesia type:General  Patient location: PACU  Post pain: Pain level controlled  Post assessment: Post-op Vital signs reviewed, Patient's Cardiovascular Status Stable, Respiratory Function Stable, Patent Airway and No signs of Nausea or vomiting  Post vital signs: Reviewed and stable  Last Vitals:  Filed Vitals:   07/22/15 1800  BP: 136/54  Pulse: 49  Temp:   Resp:     Level of consciousness: awake, alert  and patient cooperative  Complications: No apparent anesthesia complications

## 2015-07-28 ENCOUNTER — Encounter: Payer: Self-pay | Admitting: Unknown Physician Specialty

## 2015-09-16 ENCOUNTER — Encounter: Payer: Self-pay | Admitting: Anesthesiology

## 2015-09-16 ENCOUNTER — Ambulatory Visit
Admission: RE | Admit: 2015-09-16 | Discharge: 2015-09-16 | Disposition: A | Discharge status: Home / Self Care | Payer: BLUE CROSS/BLUE SHIELD | Source: Ambulatory Visit | Attending: Unknown Physician Specialty | Admitting: Unknown Physician Specialty

## 2015-09-16 ENCOUNTER — Ambulatory Visit: Payer: BLUE CROSS/BLUE SHIELD | Admitting: Anesthesiology

## 2015-09-16 ENCOUNTER — Encounter
Admission: RE | Disposition: A | Discharge status: Home / Self Care | Payer: Self-pay | Source: Ambulatory Visit | Attending: Unknown Physician Specialty

## 2015-09-16 DIAGNOSIS — Z7984 Long term (current) use of oral hypoglycemic drugs: Secondary | ICD-10-CM | POA: Diagnosis not present

## 2015-09-16 DIAGNOSIS — Q2733 Arteriovenous malformation of digestive system vessel: Secondary | ICD-10-CM | POA: Insufficient documentation

## 2015-09-16 DIAGNOSIS — K219 Gastro-esophageal reflux disease without esophagitis: Secondary | ICD-10-CM | POA: Insufficient documentation

## 2015-09-16 DIAGNOSIS — G4730 Sleep apnea, unspecified: Secondary | ICD-10-CM | POA: Diagnosis not present

## 2015-09-16 DIAGNOSIS — Z79899 Other long term (current) drug therapy: Secondary | ICD-10-CM | POA: Insufficient documentation

## 2015-09-16 DIAGNOSIS — D509 Iron deficiency anemia, unspecified: Secondary | ICD-10-CM | POA: Diagnosis present

## 2015-09-16 DIAGNOSIS — E119 Type 2 diabetes mellitus without complications: Secondary | ICD-10-CM | POA: Diagnosis not present

## 2015-09-16 DIAGNOSIS — D500 Iron deficiency anemia secondary to blood loss (chronic): Secondary | ICD-10-CM | POA: Diagnosis not present

## 2015-09-16 DIAGNOSIS — F329 Major depressive disorder, single episode, unspecified: Secondary | ICD-10-CM | POA: Insufficient documentation

## 2015-09-16 DIAGNOSIS — K7460 Unspecified cirrhosis of liver: Secondary | ICD-10-CM | POA: Insufficient documentation

## 2015-09-16 DIAGNOSIS — K317 Polyp of stomach and duodenum: Secondary | ICD-10-CM | POA: Diagnosis not present

## 2015-09-16 DIAGNOSIS — F17200 Nicotine dependence, unspecified, uncomplicated: Secondary | ICD-10-CM | POA: Insufficient documentation

## 2015-09-16 DIAGNOSIS — F419 Anxiety disorder, unspecified: Secondary | ICD-10-CM | POA: Insufficient documentation

## 2015-09-16 HISTORY — PX: ESOPHAGOGASTRODUODENOSCOPY (EGD) WITH PROPOFOL: SHX5813

## 2015-09-16 LAB — GLUCOSE, CAPILLARY: GLUCOSE-CAPILLARY: 272 mg/dL — AB (ref 65–99)

## 2015-09-16 SURGERY — ESOPHAGOGASTRODUODENOSCOPY (EGD) WITH PROPOFOL
Anesthesia: General

## 2015-09-16 MED ORDER — LIDOCAINE HCL (CARDIAC) 20 MG/ML IV SOLN
INTRAVENOUS | Status: DC | PRN
  Administered 2015-09-16: 80 mg via INTRAVENOUS

## 2015-09-16 MED ORDER — MIDAZOLAM HCL 2 MG/2ML IJ SOLN
INTRAMUSCULAR | Status: DC | PRN
  Administered 2015-09-16: 2 mg via INTRAVENOUS

## 2015-09-16 MED ORDER — SODIUM CHLORIDE 0.9 % IV SOLN: INTRAVENOUS | Status: DC

## 2015-09-16 MED ORDER — SODIUM CHLORIDE 0.9 % IV SOLN
INTRAVENOUS | Status: DC
  Administered 2015-09-16: 1000 mL via INTRAVENOUS

## 2015-09-16 MED ORDER — PROPOFOL 500 MG/50ML IV EMUL
INTRAVENOUS | Status: DC | PRN
  Administered 2015-09-16: 170 ug/kg/min via INTRAVENOUS

## 2015-09-16 NOTE — Anesthesia Postprocedure Evaluation (Addendum)
Anesthesia Post Note  Patient: Corey West  Procedure(s) Performed: Procedure(s) (LRB): ESOPHAGOGASTRODUODENOSCOPY (EGD) WITH PROPOFOL (N/A)  Patient location during evaluation: Endoscopy Anesthesia Type: General Level of consciousness: awake, awake and alert and oriented Pain management: pain level controlled Vital Signs Assessment: post-procedure vital signs reviewed and stable Respiratory status: spontaneous breathing Cardiovascular status: blood pressure returned to baseline Anesthetic complications: no    Last Vitals:  Filed Vitals:   09/16/15 0926 09/16/15 0936  BP: 92/57 132/80  Pulse: 66 63  Temp:    Resp: 16 17    Last Pain:  Filed Vitals:   09/16/15 0944  PainSc: Asleep                 Blakleigh Straw

## 2015-09-16 NOTE — Op Note (Signed)
[facility] Patient Name: Corey West Procedure Date: 09/16/2015 7:52 AM MRN: [id number] Account #: [id number] Date of Birth: Feb 25, 1951 Admit Type: Outpatient Age: 64 Room: [facility] ENDO ROOM 1 Gender: Male Note Status: Finalized Procedure:         Upper GI endoscopy Indications:       Iron deficiency anemia secondary to chronic blood loss Providers:         Manya Silvas, MD Referring MD:      Hewitt Blade. Sarina Ser, MD (Referring MD) Medicines:         Propofol per Anesthesia Complications:     No immediate complications. Procedure:         Pre-Anesthesia Assessment:                    - After reviewing the risks and benefits, the patient was                     deemed in satisfactory condition to undergo the procedure.                    After obtaining informed consent, the endoscope was passed                     under direct vision. Throughout the procedure, the                     patient's blood pressure, pulse, and oxygen saturations                     were monitored continuously. The Endoscope was introduced                     through the mouth, and advanced to the pylorus. The upper                     GI endoscopy was somewhat difficult. The patient tolerated                     the procedure well. Findings:      Localized mild mucosal changes were found in the lower third of the       esophagus.      Hematin (altered blood/coffee-ground-like material) was found in the       gastric antrum. Coagulation for hemostasis using argon plasma at 0.5       liters/minute and 20 watts was successful.      A single 15 mm sessile polyp with bleeding and stigmata of recent       bleeding was found on the lesser curvature of the stomach. Biopsies were       taken with a cold forceps for histology. This was treated and the base       was clipped, more argon treatment until no bleeding seen. Impression:        - Mucosal changes in the esophagus.                - Hematin (altered blood/coffee-ground-like material) in                     the gastric antrum. Treated with argon plasma coagulation                     (APC).                    -  A single gastric polyp. Biopsied. Recommendation:    - The findings and recommendations were discussed with the                     patient's family. Manya Silvas, MD 09/16/2015 9:06:20 AM This report has been signed electronically. Number of Addenda: 0 Note Initiated On: 09/16/2015 7:52 AM      [facility]

## 2015-09-16 NOTE — H&P (Signed)
Primary Care Physician:  Madelyn Brunner, MD Primary Gastroenterologist:  Dr. Vira Agar  Pre-Procedure History & Physical: HPI:  Corey West is a 64 y.o. male is here for an endoscopy.   Past Medical History  Diagnosis Date  . Cirrhosis ([place])   . Diabetes mellitus without complication ([place])   . Sleep apnea   . Depression   . Anxiety   . GERD (gastroesophageal reflux disease)   . History of hiatal hernia   . Arthritis   . Anemia     Past Surgical History  Procedure Laterality Date  . Tonsillectomy and adenoidectomy    . Uvulopalatopharyngoplasty    . Ulnar nerve transposition    . Esophagogastroduodenoscopy N/A 02/09/2015    Procedure: ESOPHAGOGASTRODUODENOSCOPY (EGD);  Surgeon: Manya Silvas, MD;  Location: [facility] ENDOSCOPY;  Service: Endoscopy;  Laterality: N/A;  . Tonsillectomy    . Esophagogastroduodenoscopy (egd) with propofol N/A 07/20/2015    Procedure: ESOPHAGOGASTRODUODENOSCOPY (EGD) WITH PROPOFOL;  Surgeon: Manya Silvas, MD;  Location: [facility];  Service: Endoscopy;  Laterality: N/A;  . Esophagogastroduodenoscopy N/A 07/22/2015    Procedure: ESOPHAGOGASTRODUODENOSCOPY (EGD);  Surgeon: Manya Silvas, MD;  Location: [facility] ENDOSCOPY;  Service: Endoscopy;  Laterality: N/A;    Prior to Admission medications   Medication Sig Start Date End Date Taking? Authorizing Provider  Ferrous Sulfate 140 (45 FE) MG TBCR Take 1 tablet by mouth daily.   Yes Historical Provider, MD  furosemide (LASIX) 40 MG tablet Take 40 mg by mouth 2 (two) times daily.   Yes Historical Provider, MD  insulin aspart protamine- aspart (NOVOLOG MIX 70/30) (70-30) 100 UNIT/ML injection Inject 62-84 Units into the skin 2 (two) times daily with a meal. Pt uses 84 units with breakfast and 62 units with supper.   Yes Historical Provider, MD  lactulose (CHRONULAC) 10 GM/15ML solution Take 20 g by mouth 2 (two) times daily.   Yes Historical Provider, MD  LORazepam (ATIVAN) 0.5 MG tablet Take 0.5  mg by mouth every 8 (eight) hours as needed for anxiety.   Yes Historical Provider, MD  metolazone (ZAROXOLYN) 2.5 MG tablet Take 2.5 mg by mouth every other day.   Yes Historical Provider, MD  nadolol (CORGARD) 40 MG tablet Take 20 mg by mouth daily.   Yes Historical Provider, MD  omeprazole (PRILOSEC) 40 MG capsule Take 40 mg by mouth 2 (two) times daily.   Yes Historical Provider, MD  rifaximin (XIFAXAN) 550 MG TABS tablet Take 550 mg by mouth 2 (two) times daily.   Yes Historical Provider, MD  simvastatin (ZOCOR) 40 MG tablet Take 40 mg by mouth at bedtime.   Yes Historical Provider, MD  spironolactone (ALDACTONE) 25 MG tablet Take 25 mg by mouth daily.   Yes Historical Provider, MD  sucralfate (CARAFATE) 1 GM/10ML suspension Take 10 mLs (1 g total) by mouth 4 (four) times daily -  with meals and at bedtime. 02/11/15  Yes Aldean Jewett, MD  traMADol (ULTRAM) 50 MG tablet Take by mouth every 6 (six) hours as needed.   Yes Historical Provider, MD    Allergies as of 07/31/2015  . (No Known Allergies)    Family History  Problem Relation Age of Onset  . Diabetes Other     Social History   Social History  . Marital Status: Single    Spouse Name: N/A  . Number of Children: N/A  . Years of Education: N/A   Occupational History  . Not on file.  Social History Main Topics  . Smoking status: Current Some Day Smoker  . Smokeless tobacco: Never Used  . Alcohol Use: No  . Drug Use: No  . Sexual Activity: Not on file   Other Topics Concern  . Not on file   Social History Narrative    Review of Systems: See HPI, otherwise negative ROS  Physical Exam: BP 134/48 mmHg  Pulse 66  Temp(Src) 98.4 F (36.9 C) (Tympanic)  Resp 16  Ht 5\' 11"  (1.803 m)  Wt 140.615 kg (310 lb)  BMI 43.26 kg/m2  SpO2 100% General:   Alert,  pleasant and cooperative in NAD Head:  Normocephalic and atraumatic. Neck:  Supple; no masses or thyromegaly. Lungs:  Clear throughout to auscultation.     Heart:  Regular rate and rhythm. Abdomen:  Soft, nontender and nondistended. Normal bowel sounds, without guarding, and without rebound.   Neurologic:  Alert and  oriented x4;  grossly normal neurologically.  Impression/Plan: Corey West is here for an endoscopy to be performed for follow up bleeding and ulceration of stomach  Risks, benefits, limitations, and alternatives regarding  endoscopy have been reviewed with the patient.  Questions have been answered.  All parties agreeable.   [name], MD  09/16/2015, 8:00 AM

## 2015-09-16 NOTE — Addendum Note (Signed)
Addendum  created 09/16/15 1321 by Alvin Critchley, MD   Modules edited: Clinical Notes   Clinical Notes:  File: [id number]

## 2015-09-16 NOTE — Transfer of Care (Signed)
Immediate Anesthesia Transfer of Care Note  Patient: Corey West  Procedure(s) Performed: Procedure(s): ESOPHAGOGASTRODUODENOSCOPY (EGD) WITH PROPOFOL (N/A)  Patient Location: PACU  Anesthesia Type:General  Level of Consciousness: awake, alert  and oriented  Airway & Oxygen Therapy: Patient Spontanous Breathing and Patient connected to nasal cannula oxygen  Post-op Assessment: Report given to RN and Post -op Vital signs reviewed and stable  Post vital signs: Reviewed and stable  Last Vitals: 08:56 64 hr 100% 18resp 115/63 Filed Vitals:   09/16/15 0722  BP: 134/48  Pulse: 66  Temp: 36.9 C  Resp: 16    Complications: No apparent anesthesia complications

## 2015-09-16 NOTE — Anesthesia Preprocedure Evaluation (Signed)
Anesthesia Evaluation  Patient identified by MRN, date of birth, ID band Patient awake    Reviewed: Allergy & Precautions, H&P , NPO status , Patient's Chart, lab work & pertinent test results  Airway Mallampati: II  TM Distance: >3 FB Neck ROM: full    Dental  (+) Poor Dentition, Chipped   Pulmonary sleep apnea and Continuous Positive Airway Pressure Ventilation , Current Smoker,    Pulmonary exam normal breath sounds clear to auscultation       Cardiovascular negative cardio ROS Normal cardiovascular exam Rhythm:regular Rate:Normal     Neuro/Psych PSYCHIATRIC DISORDERS Anxiety Depression negative neurological ROS     GI/Hepatic negative GI ROS, Neg liver ROS, hiatal hernia, GERD  Medicated and Controlled,(+) Cirrhosis   Esophageal Varices    ,   Endo/Other  diabetes, Well Controlled, Type 2, Oral Hypoglycemic Agents  Renal/GU negative Renal ROS  negative genitourinary   Musculoskeletal  (+) Arthritis , Osteoarthritis,    Abdominal   Peds negative pediatric ROS (+)  Hematology negative hematology ROS (+) anemia ,   Anesthesia Other Findings Past Medical History:   Cirrhosis ([place])                                              Diabetes mellitus without complication ([facility])                 Sleep apnea                                                  Depression                                                   Anxiety                                                      GERD (gastroesophageal reflux disease)                       History of hiatal hernia                                     Arthritis                                                    Anemia                                                      Past Surgical History:   TONSILLECTOMY AND ADENOIDECTOMY  UVULOPALATOPHARYNGOPLASTY                                     ULNAR NERVE TRANSPOSITION                                      ESOPHAGOGASTRODUODENOSCOPY                      N/A 02/09/2015      Comment:Procedure: ESOPHAGOGASTRODUODENOSCOPY (EGD);                Surgeon: Manya Silvas, MD;  Location: [facility]               ENDOSCOPY;  Service: Endoscopy;  Laterality:               N/A;   TONSILLECTOMY                                                   Reproductive/Obstetrics negative OB ROS                             Anesthesia Physical Anesthesia Plan  ASA: III  Anesthesia Plan: General   Post-op Pain Management:    Induction: Intravenous  Airway Management Planned: Nasal Cannula  Additional Equipment:   Intra-op Plan:   Post-operative Plan:   Informed Consent: I have reviewed the patients History and Physical, chart, labs and discussed the procedure including the risks, benefits and alternatives for the proposed anesthesia with the patient or authorized representative who has indicated his/her understanding and acceptance.   Dental advisory given  Plan Discussed with: CRNA and Surgeon  Anesthesia Plan Comments:         Anesthesia Quick Evaluation

## 2015-09-17 ENCOUNTER — Encounter: Payer: Self-pay | Admitting: Unknown Physician Specialty

## 2015-09-19 LAB — SURGICAL PATHOLOGY

## 2015-10-12 ENCOUNTER — Other Ambulatory Visit: Payer: Self-pay

## 2015-10-12 ENCOUNTER — Emergency Department
Admission: EM | Admit: 2015-10-12 | Discharge: 2015-10-12 | Disposition: A | Discharge status: Home / Self Care | Payer: BLUE CROSS/BLUE SHIELD | Attending: Emergency Medicine | Admitting: Emergency Medicine

## 2015-10-12 ENCOUNTER — Encounter: Payer: Self-pay | Admitting: Emergency Medicine

## 2015-10-12 DIAGNOSIS — Z79899 Other long term (current) drug therapy: Secondary | ICD-10-CM | POA: Diagnosis not present

## 2015-10-12 DIAGNOSIS — E119 Type 2 diabetes mellitus without complications: Secondary | ICD-10-CM | POA: Insufficient documentation

## 2015-10-12 DIAGNOSIS — F17200 Nicotine dependence, unspecified, uncomplicated: Secondary | ICD-10-CM | POA: Insufficient documentation

## 2015-10-12 DIAGNOSIS — R5383 Other fatigue: Secondary | ICD-10-CM | POA: Diagnosis present

## 2015-10-12 DIAGNOSIS — R110 Nausea: Secondary | ICD-10-CM | POA: Insufficient documentation

## 2015-10-12 DIAGNOSIS — Z794 Long term (current) use of insulin: Secondary | ICD-10-CM | POA: Diagnosis not present

## 2015-10-12 DIAGNOSIS — R05 Cough: Secondary | ICD-10-CM | POA: Insufficient documentation

## 2015-10-12 DIAGNOSIS — R0981 Nasal congestion: Secondary | ICD-10-CM | POA: Insufficient documentation

## 2015-10-12 DIAGNOSIS — R059 Cough, unspecified: Secondary | ICD-10-CM

## 2015-10-12 DIAGNOSIS — Z792 Long term (current) use of antibiotics: Secondary | ICD-10-CM | POA: Diagnosis not present

## 2015-10-12 LAB — CBC
HCT: 29.2 % — ABNORMAL LOW (ref 40.0–52.0)
HEMOGLOBIN: 9.9 g/dL — AB (ref 13.0–18.0)
MCH: 29.8 pg (ref 26.0–34.0)
MCHC: 33.9 g/dL (ref 32.0–36.0)
MCV: 88 fL (ref 80.0–100.0)
PLATELETS: 85 10*3/uL — AB (ref 150–440)
RBC: 3.32 MIL/uL — AB (ref 4.40–5.90)
RDW: 16 % — ABNORMAL HIGH (ref 11.5–14.5)
WBC: 6.4 10*3/uL (ref 3.8–10.6)

## 2015-10-12 LAB — BASIC METABOLIC PANEL
ANION GAP: 8 (ref 5–15)
BUN: 13 mg/dL (ref 6–20)
CHLORIDE: 101 mmol/L (ref 101–111)
CO2: 25 mmol/L (ref 22–32)
CREATININE: 1.89 mg/dL — AB (ref 0.61–1.24)
Calcium: 9.2 mg/dL (ref 8.9–10.3)
GFR calc non Af Amer: 36 mL/min — ABNORMAL LOW (ref >60)
GFR, EST AFRICAN AMERICAN: 42 mL/min — AB (ref >60)
Glucose, Bld: 205 mg/dL — ABNORMAL HIGH (ref 65–99)
POTASSIUM: 3.5 mmol/L (ref 3.5–5.1)
SODIUM: 134 mmol/L — AB (ref 135–145)

## 2015-10-12 MED ORDER — BENZONATATE 100 MG PO CAPS
100.0000 mg | ORAL_CAPSULE | Freq: Once | ORAL | Status: AC
  Administered 2015-10-12: 100 mg via ORAL
  Filled 2015-10-12: qty 1

## 2015-10-12 MED ORDER — BENZONATATE 100 MG PO CAPS: 100.0000 mg | ORAL_CAPSULE | Freq: Three times a day (TID) | ORAL | Status: AC | PRN

## 2015-10-12 NOTE — Discharge Instructions (Signed)
You were evaluated for fatigue, no certain cause was found, however your exam and evaluation are reassuring today.  Return to the emergency department for any worsening condition including any fever, abdominal pain, chest pain, trouble breathing, or any other symptoms concerning to you.   Fatigue Fatigue is feeling tired all of the time, a lack of energy, or a lack of motivation. Occasional or mild fatigue is often a normal response to activity or life in general. However, long-lasting (chronic) or extreme fatigue may indicate an underlying medical condition. HOME CARE INSTRUCTIONS  Watch your fatigue for any changes. The following actions may help to lessen any discomfort you are feeling:  Talk to your health care provider about how much sleep you need each night. Try to get the required amount every night.  Take medicines only as directed by your health care provider.  Eat a healthy and nutritious diet. Ask your health care provider if you need help changing your diet.  Drink enough fluid to keep your urine clear or pale yellow.  Practice ways of relaxing, such as yoga, meditation, massage therapy, or acupuncture.  Exercise regularly.   Change situations that cause you stress. Try to keep your work and personal routine reasonable.  Do not abuse illegal drugs.  Limit alcohol intake to no more than 1 drink per day for nonpregnant women and 2 drinks per day for men. One drink equals 12 ounces of beer, 5 ounces of wine, or 1 ounces of hard liquor.  Take a multivitamin, if directed by your health care provider. SEEK MEDICAL CARE IF:   Your fatigue does not get better.  You have a fever.   You have unintentional weight loss or gain.  You have headaches.   You have difficulty:   Falling asleep.  Sleeping throughout the night.  You feel angry, guilty, anxious, or sad.   You are unable to have a bowel movement (constipation).   You skin is dry.   Your legs or  another part of your body is swollen.  SEEK IMMEDIATE MEDICAL CARE IF:   You feel confused.   Your vision is blurry.  You feel faint or pass out.   You have a severe headache.   You have severe abdominal, pelvic, or back pain.   You have chest pain, shortness of breath, or an irregular or fast heartbeat.   You are unable to urinate or you urinate less than normal.   You develop abnormal bleeding, such as bleeding from the rectum, vagina, nose, lungs, or nipples.  You vomit blood.   You have thoughts about harming yourself or committing suicide.   You are worried that you might harm someone else.    This information is not intended to replace advice given to you by your health care provider. Make sure you discuss any questions you have with your health care provider.   Document Released: 07/15/2007 Document Revised: 10/08/2014 Document Reviewed: 01/19/2014 Elsevier Interactive Patient Education [employer].

## 2015-10-12 NOTE — ED Provider Notes (Signed)
[facility] Emergency Department Provider Note   ____________________________________________  Time seen: Approximately 4 PM I have reviewed the triage vital signs and the triage nursing note.  HISTORY  Chief Complaint Fatigue and Anemia   Historian Patient  HPI Corey West is a 65 y.o. male is here for evaluation of cough and fatigue which started today. Patient states that he was at the [place] [facility] visiting his podiatrist and when he went back out to his car he felt a little nauseated and felt fatigued. 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 Vira Agar with gastroenterology. He reports he was told if he feels fatigued than he should come to the emergency department to be evaluated for new or recurrent bleeding.  He does have dark stools at baseline due to taking iron.  He did start coughing yesterday with a breast right congestion, but has no fever.  No trouble breathing. No chest pain. No weakness or numbness. No confusion.    Past Medical History  Diagnosis Date  . Cirrhosis ([place])   . Diabetes mellitus without complication ([place])   . Sleep apnea   . Depression   . Anxiety   . GERD (gastroesophageal reflux disease)   . History of hiatal hernia   . Arthritis   . Anemia     Patient Active Problem List   Diagnosis Date Noted  . GI bleed 07/20/2015  . Hematemesis 02/08/2015  . Cirrhosis ([place]) 02/08/2015  . Esophageal varices ([place]) 02/08/2015  . Type 2 diabetes mellitus ([place]) 02/08/2015  . Gastroesophageal reflux disease 02/08/2015  . Hyperlipidemia 02/08/2015  . Anemia 12/04/2013  . Thrombocytopenia ([place]) 12/04/2013  . Splenomegaly 11/04/2013    Past Surgical History  Procedure Laterality Date  . Tonsillectomy and adenoidectomy    . Uvulopalatopharyngoplasty    . Ulnar nerve transposition    . Esophagogastroduodenoscopy N/A 02/09/2015    Procedure: ESOPHAGOGASTRODUODENOSCOPY (EGD);  Surgeon:  Manya Silvas, MD;  Location: [facility] ENDOSCOPY;  Service: Endoscopy;  Laterality: N/A;  . Tonsillectomy    . Esophagogastroduodenoscopy (egd) with propofol N/A 07/20/2015    Procedure: ESOPHAGOGASTRODUODENOSCOPY (EGD) WITH PROPOFOL;  Surgeon: Manya Silvas, MD;  Location: [place];  Service: Endoscopy;  Laterality: N/A;  . Esophagogastroduodenoscopy N/A 07/22/2015    Procedure: ESOPHAGOGASTRODUODENOSCOPY (EGD);  Surgeon: Manya Silvas, MD;  Location[facility] ENDOSCOPY;  Service: Endoscopy;  Laterality: N/A;  . Esophagogastroduodenoscopy (egd) with propofol N/A 09/16/2015    Procedure: ESOPHAGOGASTRODUODENOSCOPY (EGD) WITH PROPOFOL;  Surgeon: Manya Silvas, MD;  Location: [facility] ENDOSCOPY;  Service: Endoscopy;  Laterality: N/A;    Current Outpatient Rx  Name  Route  Sig  Dispense  Refill  . Ferrous Sulfate 140 (45 FE) MG TBCR   Oral   Take 1 tablet by mouth daily.         . furosemide (LASIX) 40 MG tablet   Oral   Take 40 mg by mouth 2 (two) times daily.         . insulin aspart protamine- aspart (NOVOLOG MIX 70/30) (70-30) 100 UNIT/ML injection   Subcutaneous   Inject 62-84 Units into the skin 2 (two) times daily with a meal. Pt uses 84 units with breakfast and 62 units with supper.         . lactulose (CHRONULAC) 10 GM/15ML solution   Oral   Take 20 g by mouth 2 (two) times daily.         Marland Kitchen LORazepam (ATIVAN)  0.5 MG tablet   Oral   Take 0.5 mg by mouth every 8 (eight) hours as needed for anxiety.         . metolazone (ZAROXOLYN) 2.5 MG tablet   Oral   Take 2.5 mg by mouth every other day.         . nadolol (CORGARD) 40 MG tablet   Oral   Take 20 mg by mouth daily.         Marland Kitchen omeprazole (PRILOSEC) 40 MG capsule   Oral   Take 40 mg by mouth 2 (two) times daily.         . rifaximin (XIFAXAN) 550 MG TABS tablet   Oral   Take 550 mg by mouth 2 (two) times daily.         . simvastatin (ZOCOR) 40 MG tablet   Oral   Take 40 mg by mouth at  bedtime.         Marland Kitchen spironolactone (ALDACTONE) 25 MG tablet   Oral   Take 25 mg by mouth daily.         . sucralfate (CARAFATE) 1 GM/10ML suspension   Oral   Take 10 mLs (1 g total) by mouth 4 (four) times daily -  with meals and at bedtime.   420 mL   1   . traMADol (ULTRAM) 50 MG tablet   Oral   Take by mouth every 6 (six) hours as needed.           Allergies Review of patient's allergies indicates no known allergies.  Family History  Problem Relation Age of Onset  . Diabetes Other     Social History Social History  Substance Use Topics  . Smoking status: Current Some Day Smoker  . Smokeless tobacco: Never Used  . Alcohol Use: No    Review of Systems  Constitutional: Negative for fever. Eyes: Negative for visual changes. ENT: Negative for sore throat. Cardiovascular: Negative for chest pain. Respiratory: Negative for shortness of breath. Gastrointestinal: Negative for abdominal pain, vomiting and diarrhea. Genitourinary: Negative for dysuria. Musculoskeletal: Negative for back pain. Skin: Negative for rash. Neurological: Negative for headache. 10 point Review of Systems otherwise negative ____________________________________________   PHYSICAL EXAM:  VITAL SIGNS: ED Triage Vitals  Enc Vitals Group     BP 10/12/15 1226 120/55 mmHg     Pulse Rate 10/12/15 1226 73     Resp 10/12/15 1226 18     Temp 10/12/15 1226 98 F (36.7 C)     Temp Source 10/12/15 1226 Oral     SpO2 10/12/15 1226 98 %     Weight --      Height --      Head Cir --      Peak Flow --      Pain Score 10/12/15 1227 5     Pain Loc --      Pain Edu? --      Excl. in [place]? --      Constitutional: Alert and oriented. Well appearing and in no distress. Eyes: Conjunctivae are normal. PERRL. Normal extraocular movements. ENT   Head: Normocephalic and atraumatic.   Nose: No congestion/rhinnorhea.   Mouth/Throat: Mucous membranes are moist.   Neck: No  stridor. Cardiovascular/Chest: Normal rate, regular rhythm.  No murmurs, rubs, or gallops. Respiratory: Normal respiratory effort without tachypnea nor retractions. Breath sounds are clear and equal bilaterally. No wheezes/rales/rhonchi. Gastrointestinal: Soft. No distention, no guarding, no rebound. Nontender. Obese  Genitourinary/rectal:Deferred Musculoskeletal: Nontender with  normal range of motion in all extremities. No joint effusions.  No lower extremity tenderness.  No edema. Neurologic:  Normal speech and language. No gross or focal neurologic deficits are appreciated. Skin:  Skin is warm, dry and intact. No rash noted. Psychiatric: Mood and affect are normal. Speech and behavior are normal. Patient exhibits appropriate insight and judgment.  ____________________________________________   EKG I, Lisa Roca, MD, the attending physician have personally viewed and interpreted all ECGs.  77 beats appear normal sinus rhythm. Narrow QRS. Normal axis. Nonspecific T-wave. QTc 495 ____________________________________________  LABS (pertinent positives/negatives)  Sodium 134, creatinine 1.89, glucose 205 White blood cell count 6.4, hemoglobin 9.9, platelet count 85  ____________________________________________  RADIOLOGY All Xrays were viewed by me. Imaging interpreted by Radiologist.  None __________________________________________  PROCEDURES  Procedure(s) performed: None  Critical Care performed: None  ____________________________________________   ED COURSE / ASSESSMENT AND PLAN  CONSULTATIONS: None  Pertinent labs & imaging results that were available during my care of the patient were reviewed by me and considered in my medical decision making (see chart for details).   Patient is here for a feeling of fatigue this afternoon for which he feels a little bit better now. He did have a little bit of nausea at the same time, and he's had a cough for one day but  without fever, trouble breathing.  There is been no chest pain. His main concern was to have his blood count checked to make sure that has not dropped.  Hemoglobin is 9.9 which is actually up from previous. His physical exam is reassuring. His orthostatic BP are reassuring.  I'm not suspicious of internal bleeding, nor emergency cause of fatigue and nausea such as acute coronary syndrome.  Orthostatics are negative.  Patient / Family / Caregiver informed of clinical course, medical decision-making process, and agree with plan.   I discussed return precautions, follow-up instructions, and discharged instructions with patient and/or family.  ___________________________________________   FINAL CLINICAL IMPRESSION(S) / ED DIAGNOSES   Final diagnoses:  Other fatigue  Cough              Note: This dictation was prepared with Dragon dictation. Any transcriptional errors that result from this process are unintentional   Lisa Roca, MD 10/12/15 1711

## 2015-10-12 NOTE — ED Notes (Signed)
PT here for concern of bleeding from his liver. Has hx of cirrhosis and recently had surgery to control bleeding in his liver.

## 2015-11-14 ENCOUNTER — Encounter: Payer: Self-pay | Admitting: Hematology and Oncology

## 2015-11-29 ENCOUNTER — Inpatient Hospital Stay: Payer: BLUE CROSS/BLUE SHIELD

## 2015-11-29 ENCOUNTER — Inpatient Hospital Stay: Payer: BLUE CROSS/BLUE SHIELD | Admitting: Hematology and Oncology

## 2015-11-29 ENCOUNTER — Other Ambulatory Visit: Payer: Self-pay

## 2015-11-29 DIAGNOSIS — D649 Anemia, unspecified: Secondary | ICD-10-CM

## 2015-11-29 DIAGNOSIS — D696 Thrombocytopenia, unspecified: Secondary | ICD-10-CM

## 2015-12-13 ENCOUNTER — Other Ambulatory Visit: Payer: BLUE CROSS/BLUE SHIELD

## 2015-12-13 ENCOUNTER — Ambulatory Visit: Payer: BLUE CROSS/BLUE SHIELD | Admitting: Hematology and Oncology

## 2016-03-15 ENCOUNTER — Encounter: Payer: Self-pay | Admitting: *Deleted

## 2016-03-16 ENCOUNTER — Ambulatory Visit: Payer: BLUE CROSS/BLUE SHIELD | Admitting: Certified Registered"

## 2016-03-16 ENCOUNTER — Encounter: Payer: Self-pay | Admitting: *Deleted

## 2016-03-16 ENCOUNTER — Encounter
Admission: RE | Disposition: A | Discharge status: Home / Self Care | Payer: Self-pay | Source: Ambulatory Visit | Attending: Unknown Physician Specialty

## 2016-03-16 ENCOUNTER — Ambulatory Visit
Admission: RE | Admit: 2016-03-16 | Discharge: 2016-03-16 | Disposition: A | Discharge status: Home / Self Care | Payer: BLUE CROSS/BLUE SHIELD | Source: Ambulatory Visit | Attending: Unknown Physician Specialty | Admitting: Unknown Physician Specialty

## 2016-03-16 DIAGNOSIS — F17210 Nicotine dependence, cigarettes, uncomplicated: Secondary | ICD-10-CM | POA: Diagnosis not present

## 2016-03-16 DIAGNOSIS — F329 Major depressive disorder, single episode, unspecified: Secondary | ICD-10-CM | POA: Diagnosis not present

## 2016-03-16 DIAGNOSIS — Z79899 Other long term (current) drug therapy: Secondary | ICD-10-CM | POA: Diagnosis not present

## 2016-03-16 DIAGNOSIS — Z794 Long term (current) use of insulin: Secondary | ICD-10-CM | POA: Insufficient documentation

## 2016-03-16 DIAGNOSIS — K317 Polyp of stomach and duodenum: Secondary | ICD-10-CM | POA: Insufficient documentation

## 2016-03-16 DIAGNOSIS — D649 Anemia, unspecified: Secondary | ICD-10-CM | POA: Diagnosis not present

## 2016-03-16 DIAGNOSIS — M1990 Unspecified osteoarthritis, unspecified site: Secondary | ICD-10-CM | POA: Insufficient documentation

## 2016-03-16 DIAGNOSIS — K922 Gastrointestinal hemorrhage, unspecified: Secondary | ICD-10-CM | POA: Diagnosis present

## 2016-03-16 DIAGNOSIS — K31811 Angiodysplasia of stomach and duodenum with bleeding: Secondary | ICD-10-CM | POA: Diagnosis not present

## 2016-03-16 DIAGNOSIS — E119 Type 2 diabetes mellitus without complications: Secondary | ICD-10-CM | POA: Insufficient documentation

## 2016-03-16 DIAGNOSIS — F419 Anxiety disorder, unspecified: Secondary | ICD-10-CM | POA: Insufficient documentation

## 2016-03-16 DIAGNOSIS — K7460 Unspecified cirrhosis of liver: Secondary | ICD-10-CM | POA: Diagnosis not present

## 2016-03-16 DIAGNOSIS — G4730 Sleep apnea, unspecified: Secondary | ICD-10-CM | POA: Diagnosis not present

## 2016-03-16 DIAGNOSIS — K219 Gastro-esophageal reflux disease without esophagitis: Secondary | ICD-10-CM | POA: Insufficient documentation

## 2016-03-16 HISTORY — PX: ESOPHAGOGASTRODUODENOSCOPY (EGD) WITH PROPOFOL: SHX5813

## 2016-03-16 LAB — GLUCOSE, CAPILLARY
Glucose-Capillary: 291 mg/dL — ABNORMAL HIGH (ref 65–99)
Glucose-Capillary: 224 mg/dL — ABNORMAL HIGH (ref 65–99)

## 2016-03-16 SURGERY — ESOPHAGOGASTRODUODENOSCOPY (EGD) WITH PROPOFOL
Anesthesia: General

## 2016-03-16 MED ORDER — SODIUM CHLORIDE 0.9 % IV SOLN
INTRAVENOUS | Status: DC
  Administered 2016-03-16: 07:00:00 via INTRAVENOUS

## 2016-03-16 MED ORDER — SODIUM CHLORIDE 0.9 % IV SOLN: INTRAVENOUS | Status: DC

## 2016-03-16 MED ORDER — GLYCOPYRROLATE 0.2 MG/ML IJ SOLN
INTRAMUSCULAR | Status: DC | PRN
  Administered 2016-03-16: 0.2 mg via INTRAVENOUS

## 2016-03-16 MED ORDER — FENTANYL CITRATE (PF) 100 MCG/2ML IJ SOLN: 25.0000 ug | INTRAMUSCULAR | Status: DC | PRN

## 2016-03-16 MED ORDER — ONDANSETRON HCL 4 MG/2ML IJ SOLN: 4.0000 mg | Freq: Once | INTRAMUSCULAR | Status: DC | PRN

## 2016-03-16 MED ORDER — PROPOFOL 10 MG/ML IV BOLUS
INTRAVENOUS | Status: DC | PRN
  Administered 2016-03-16: 70 mg via INTRAVENOUS
  Administered 2016-03-16: 30 mg via INTRAVENOUS

## 2016-03-16 MED ORDER — LIDOCAINE HCL (PF) 2 % IJ SOLN
INTRAMUSCULAR | Status: DC | PRN
  Administered 2016-03-16: 100 mg via INTRADERMAL

## 2016-03-16 MED ORDER — PROPOFOL 500 MG/50ML IV EMUL
INTRAVENOUS | Status: DC | PRN
  Administered 2016-03-16: 150 ug/kg/min via INTRAVENOUS

## 2016-03-16 MED ORDER — INSULIN ASPART 100 UNIT/ML ~~LOC~~ SOLN
5.0000 [IU] | Freq: Once | SUBCUTANEOUS | Status: AC
  Administered 2016-03-16: 5 [IU] via SUBCUTANEOUS
  Filled 2016-03-16: qty 0.05

## 2016-03-16 NOTE — Op Note (Signed)
[facility] Gastroenterology Patient Name: Corey West Procedure Date: 03/16/2016 6:53 AM MRN: [id number] Account #: [id number] Date of Birth: 11-21-1950 Admit Type: Outpatient Age: 65 Room: [facility] ENDO ROOM 1 Gender: Male Note Status: Finalized Procedure:            Upper GI endoscopy Indications:          Suspected upper gastrointestinal bleeding, Suspected                        upper gastrointestinal bleeding in patient with chronic                        blood loss Providers:            Manya Silvas, MD Referring MD:         Hewitt Blade. Sarina Ser, MD (Referring MD) Medicines:            Propofol per Anesthesia Complications:        No immediate complications. Procedure:            Pre-Anesthesia Assessment:                       - After reviewing the risks and benefits, the patient                        was deemed in satisfactory condition to undergo the                        procedure.                       After obtaining informed consent, the endoscope was                        passed under direct vision. Throughout the procedure,                        the patient's blood pressure, pulse, and oxygen                        saturations were monitored continuously. The Endoscope                        was introduced through the mouth, and advanced to the                        second part of duodenum. The upper GI endoscopy was                        accomplished without difficulty. The patient tolerated                        the procedure well. 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      Multiple medium sessile polyps with bleeding and stigmata of recent       bleeding were found in the gastric antrum. These polyps were removed  with a hot snare. Resection and retrieval were complete.  Multiple medium bleeding angioectasias were found in the stomach.       Coagulation for hemostasis using argon plasma at 0.4 liters/minute and       20 watts was successful.      No active bleeding seen after mulltiple snares and argon treatment. Impression:           - Multiple gastric polyps. Resected and retrieved.                       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                        Treated with argon plasma coagulation (APC). Recommendation:       Follow up EGD in 3 months.                       - The findings and recommendations were discussed with                        the patient's family. Manya Silvas, MD 03/16/2016 8:18:05 AM This report has been signed electronically. Number of Addenda: 0 Note Initiated On: 03/16/2016 6:53 AM      [facility]

## 2016-03-16 NOTE — Anesthesia Preprocedure Evaluation (Addendum)
Anesthesia Evaluation  Patient identified by MRN, date of birth, ID band Patient awake    Reviewed: Allergy & Precautions, H&P , NPO status , Patient's Chart, lab work & pertinent test results, reviewed documented beta blocker date and time   Airway Mallampati: II  TM Distance: >3 FB Neck ROM: full    Dental  (+) Poor Dentition, Chipped   Pulmonary sleep apnea and Continuous Positive Airway Pressure Ventilation , Current Smoker,    Pulmonary exam normal breath sounds clear to auscultation       Cardiovascular negative cardio ROS Normal cardiovascular exam Rhythm:regular Rate:Normal     Neuro/Psych PSYCHIATRIC DISORDERS Anxiety Depression negative neurological ROS     GI/Hepatic negative GI ROS, Neg liver ROS, hiatal hernia, GERD  Medicated and Controlled,(+) Cirrhosis   Esophageal Varices    ,   Endo/Other  diabetes, Well Controlled, Type 2, Insulin Dependent  Renal/GU negative Renal ROS  negative genitourinary   Musculoskeletal  (+) Arthritis , Osteoarthritis,    Abdominal   Peds negative pediatric ROS (+)  Hematology negative hematology ROS (+) anemia ,   Anesthesia Other Findings Cirrhosis Esophageal varices Thrombocytopenia diabetes  Reproductive/Obstetrics negative OB ROS                            Anesthesia Physical  Anesthesia Plan  ASA: III  Anesthesia Plan: General   Post-op Pain Management:    Induction: Intravenous  Airway Management Planned: Nasal Cannula  Additional Equipment:   Intra-op Plan:   Post-operative Plan:   Informed Consent: I have reviewed the patients History and Physical, chart, labs and discussed the procedure including the risks, benefits and alternatives for the proposed anesthesia with the patient or authorized representative who has indicated his/her understanding and acceptance.   Dental advisory given  Plan Discussed with: CRNA and  Surgeon  Anesthesia Plan Comments:         Anesthesia Quick Evaluation

## 2016-03-16 NOTE — H&P (Signed)
Primary Care Physician:  Madelyn Brunner, MD Primary Gastroenterologist:  Dr. Vira Agar  Pre-Procedure History & Physical: HPI:  Corey West is a 65 y.o. male is here for an endoscopy.   Past Medical History  Diagnosis Date  . Cirrhosis ([place])   . Diabetes mellitus without complication ([place])   . Sleep apnea   . Depression   . Anxiety   . GERD (gastroesophageal reflux disease)   . History of hiatal hernia   . Arthritis   . Anemia     Past Surgical History  Procedure Laterality Date  . Tonsillectomy and adenoidectomy    . Uvulopalatopharyngoplasty    . Ulnar nerve transposition    . Esophagogastroduodenoscopy N/A 02/09/2015    Procedure: ESOPHAGOGASTRODUODENOSCOPY (EGD);  Surgeon: Manya Silvas, MD;  Location: [facility] ENDOSCOPY;  Service: Endoscopy;  Laterality: N/A;  . Tonsillectomy    . Esophagogastroduodenoscopy (egd) with propofol N/A 07/20/2015    Procedure: ESOPHAGOGASTRODUODENOSCOPY (EGD) WITH PROPOFOL;  Surgeon: Manya Silvas, MD;  Location: [place];  Service: Endoscopy;  Laterality: N/A;  . Esophagogastroduodenoscopy N/A 07/22/2015    Procedure: ESOPHAGOGASTRODUODENOSCOPY (EGD);  Surgeon: Manya Silvas, MD;  Location: [facility] ENDOSCOPY;  Service: Endoscopy;  Laterality: N/A;  . Esophagogastroduodenoscopy (egd) with propofol N/A 09/16/2015    Procedure: ESOPHAGOGASTRODUODENOSCOPY (EGD) WITH PROPOFOL;  Surgeon: Manya Silvas, MD;  Location: [facility] ENDOSCOPY;  Service: Endoscopy;  Laterality: N/A;    Prior to Admission medications   Medication Sig Start Date End Date Taking? Authorizing Provider  furosemide (LASIX) 40 MG tablet Take 40 mg by mouth 2 (two) times daily.   Yes Historical Provider, MD  insulin aspart protamine- aspart (NOVOLOG MIX 70/30) (70-30) 100 UNIT/ML injection Inject 62-84 Units into the skin 2 (two) times daily with a meal. Pt uses 84 units with breakfast and 62 units with supper.   Yes Historical Provider, MD  lactulose (CHRONULAC) 10  GM/15ML solution Take 20 g by mouth 2 (two) times daily.   Yes Historical Provider, MD  metolazone (ZAROXOLYN) 2.5 MG tablet Take 2.5 mg by mouth every other day.   Yes Historical Provider, MD  omeprazole (PRILOSEC) 40 MG capsule Take 40 mg by mouth 2 (two) times daily.   Yes Historical Provider, MD  simvastatin (ZOCOR) 40 MG tablet Take 40 mg by mouth at bedtime.   Yes Historical Provider, MD  spironolactone (ALDACTONE) 25 MG tablet Take 25 mg by mouth daily.   Yes Historical Provider, MD  sucralfate (CARAFATE) 1 GM/10ML suspension Take 10 mLs (1 g total) by mouth 4 (four) times daily -  with meals and at bedtime. 02/11/15  Yes Aldean Jewett, MD  benzonatate (TESSALON PERLES) 100 MG capsule Take 1 capsule (100 mg total) by mouth 3 (three) times daily as needed for cough. 10/12/15 10/11/16  Lisa Roca, MD  Ferrous Sulfate 140 (45 FE) MG TBCR Take 1 tablet by mouth daily.    Historical Provider, MD  LORazepam (ATIVAN) 0.5 MG tablet Take 0.5 mg by mouth every 8 (eight) hours as needed for anxiety.    Historical Provider, MD  nadolol (CORGARD) 40 MG tablet Take 20 mg by mouth daily.    Historical Provider, MD  rifaximin (XIFAXAN) 550 MG TABS tablet Take 550 mg by mouth 2 (two) times daily.    Historical Provider, MD  traMADol (ULTRAM) 50 MG tablet Take by mouth every 6 (six) hours as needed.    Historical Provider, MD    Allergies as of 02/28/2016  . (  No Known Allergies)    Family History  Problem Relation Age of Onset  . Diabetes Other     Social History   Social History  . Marital Status: Single    Spouse Name: N/A  . Number of Children: N/A  . Years of Education: N/A   Occupational History  . Not on file.   Social History Main Topics  . Smoking status: Current Some Day Smoker  . Smokeless tobacco: Never Used  . Alcohol Use: No  . Drug Use: No  . Sexual Activity: Not on file   Other Topics Concern  . Not on file   Social History Narrative    Review of Systems: See  HPI, otherwise negative ROS  Physical Exam: BP 142/57 mmHg  Pulse 66  Temp(Src) 97.5 F (36.4 C) (Tympanic)  Resp 20  Ht 5\' 11"  (1.803 m)  Wt 140.615 kg (310 lb)  BMI 43.26 kg/m2  SpO2 100% General:   Alert,  pleasant and cooperative in NAD Head:  Normocephalic and atraumatic. Neck:  Supple; no masses or thyromegaly. Lungs:  Clear throughout to auscultation.    Heart:  Regular rate and rhythm. Abdomen:  Soft, nontender and nondistended. Normal bowel sounds, without guarding, and without rebound.  Patient obese. Neurologic:  Alert and  oriented x4;  grossly normal neurologically.  Impression/Plan: Corey West is here for an endoscopy to be performed for EGD to check AVM of stomach which have been a source of recurrent episodic but  continued bleeding  Risks, benefits, limitations, and alternatives regarding  endoscopy have been reviewed with the patient.  Questions have been answered.  All parties agreeable.   [name], MD  03/16/2016, 7:22 AM

## 2016-03-16 NOTE — Transfer of Care (Signed)
Immediate Anesthesia Transfer of Care Note  Patient: Corey West  Procedure(s) Performed: Procedure(s): ESOPHAGOGASTRODUODENOSCOPY (EGD) WITH PROPOFOL (N/A)  Patient Location: PACU  Anesthesia Type:General  Level of Consciousness: sedated  Airway & Oxygen Therapy: Patient Spontanous Breathing and Patient connected to nasal cannula oxygen  Post-op Assessment: Report given to RN and Post -op Vital signs reviewed and stable  Post vital signs: Reviewed and stable  Last Vitals:  Filed Vitals:   03/16/16 0653 03/16/16 0810  BP: 142/57   Pulse: 66   Temp: 36.4 C 35.8 C  Resp: 20     Last Pain: There were no vitals filed for this visit.       Complications: No apparent anesthesia complications

## 2016-03-16 NOTE — Anesthesia Procedure Notes (Signed)
Performed by: Lance Muss Intubation Type: IV induction Ventilation: Nasal airway inserted- appropriate to patient size Dental Injury: Teeth and Oropharynx as per pre-operative assessment

## 2016-03-16 NOTE — Anesthesia Postprocedure Evaluation (Signed)
Anesthesia Post Note  Patient: Corey West  Procedure(s) Performed: Procedure(s) (LRB): ESOPHAGOGASTRODUODENOSCOPY (EGD) WITH PROPOFOL (N/A)  Patient location during evaluation: PACU Anesthesia Type: General Level of consciousness: awake and alert and oriented Pain management: pain level controlled Vital Signs Assessment: post-procedure vital signs reviewed and stable Respiratory status: spontaneous breathing Cardiovascular status: blood pressure returned to baseline Anesthetic complications: no    Last Vitals:  Filed Vitals:   03/16/16 0840 03/16/16 0850  BP: 131/53 116/58  Pulse: 70 69  Temp:    Resp: 22 15    Last Pain: There were no vitals filed for this visit.               Koron Godeaux

## 2016-08-31 ENCOUNTER — Ambulatory Visit
Admission: RE | Admit: 2016-08-31 | Discharge: 2016-08-31 | Disposition: A | Discharge status: Home / Self Care | Payer: BLUE CROSS/BLUE SHIELD | Source: Ambulatory Visit | Attending: Unknown Physician Specialty | Admitting: Unknown Physician Specialty

## 2016-08-31 DIAGNOSIS — D500 Iron deficiency anemia secondary to blood loss (chronic): Secondary | ICD-10-CM | POA: Diagnosis present

## 2016-08-31 LAB — PREPARE RBC (CROSSMATCH)

## 2016-08-31 MED ORDER — SODIUM CHLORIDE 0.9 % IV SOLN: INTRAVENOUS | Status: DC

## 2016-08-31 NOTE — OR Nursing (Signed)
Patient received 1 unit of blood without complaint of any reaction. Feels fine, vital signs within normal range. Does not want any other food or drink. Discharged ambulatory (uses cane) to go home.

## 2016-09-01 LAB — TYPE AND SCREEN
ABO/RH(D): A NEG
Antibody Screen: NEGATIVE
UNIT DIVISION: 0

## 2016-09-07 ENCOUNTER — Ambulatory Visit
Admission: RE | Admit: 2016-09-07 | Discharge: 2016-09-07 | Disposition: A | Discharge status: Home / Self Care | Payer: BLUE CROSS/BLUE SHIELD | Source: Ambulatory Visit | Attending: Unknown Physician Specialty | Admitting: Unknown Physician Specialty

## 2016-09-07 DIAGNOSIS — D500 Iron deficiency anemia secondary to blood loss (chronic): Secondary | ICD-10-CM | POA: Diagnosis present

## 2016-09-07 DIAGNOSIS — K7460 Unspecified cirrhosis of liver: Secondary | ICD-10-CM | POA: Insufficient documentation

## 2016-09-07 LAB — PREPARE RBC (CROSSMATCH)

## 2016-09-07 LAB — GLUCOSE, CAPILLARY: Glucose-Capillary: 150 mg/dL — ABNORMAL HIGH (ref 65–99)

## 2016-09-07 LAB — HEMOGLOBIN AND HEMATOCRIT, BLOOD
HEMATOCRIT: 23.1 % — AB (ref 40.0–52.0)
Hemoglobin: 7.8 g/dL — ABNORMAL LOW (ref 13.0–18.0)

## 2016-09-07 MED ORDER — SODIUM CHLORIDE 0.9 % IV SOLN
INTRAVENOUS | Status: DC
Start: 2016-09-07 — End: 2016-09-08
  Administered 2016-09-07: 10 mL/h via INTRAVENOUS

## 2016-09-08 LAB — TYPE AND SCREEN
ABO/RH(D): A NEG
Antibody Screen: NEGATIVE
Unit division: 0

## 2016-09-11 ENCOUNTER — Ambulatory Visit
Admission: RE | Admit: 2016-09-11 | Discharge: 2016-09-11 | Disposition: A | Discharge status: Home / Self Care | Payer: BLUE CROSS/BLUE SHIELD | Source: Ambulatory Visit | Attending: Unknown Physician Specialty | Admitting: Unknown Physician Specialty

## 2016-09-11 DIAGNOSIS — K5520 Angiodysplasia of colon without hemorrhage: Secondary | ICD-10-CM | POA: Diagnosis present

## 2016-09-11 DIAGNOSIS — D508 Other iron deficiency anemias: Secondary | ICD-10-CM | POA: Diagnosis not present

## 2016-09-11 LAB — PREPARE RBC (CROSSMATCH)

## 2016-09-12 LAB — TYPE AND SCREEN
Blood Product Expiration Date: 201712202359
ISSUE DATE / TIME: 201712121048
Unit Type and Rh: 600

## 2016-09-14 ENCOUNTER — Ambulatory Visit: Payer: BLUE CROSS/BLUE SHIELD | Admitting: Anesthesiology

## 2016-09-14 ENCOUNTER — Encounter
Admission: RE | Disposition: A | Discharge status: Home / Self Care | Payer: Self-pay | Source: Ambulatory Visit | Attending: Unknown Physician Specialty

## 2016-09-14 ENCOUNTER — Encounter: Payer: Self-pay | Admitting: Anesthesiology

## 2016-09-14 ENCOUNTER — Ambulatory Visit
Admission: RE | Admit: 2016-09-14 | Discharge: 2016-09-14 | Disposition: A | Discharge status: Home / Self Care | Payer: BLUE CROSS/BLUE SHIELD | Source: Ambulatory Visit | Attending: Unknown Physician Specialty | Admitting: Unknown Physician Specialty

## 2016-09-14 DIAGNOSIS — K7460 Unspecified cirrhosis of liver: Secondary | ICD-10-CM | POA: Insufficient documentation

## 2016-09-14 DIAGNOSIS — Z6841 Body Mass Index (BMI) 40.0 and over, adult: Secondary | ICD-10-CM | POA: Insufficient documentation

## 2016-09-14 DIAGNOSIS — E119 Type 2 diabetes mellitus without complications: Secondary | ICD-10-CM | POA: Diagnosis not present

## 2016-09-14 DIAGNOSIS — K921 Melena: Secondary | ICD-10-CM | POA: Diagnosis not present

## 2016-09-14 DIAGNOSIS — K317 Polyp of stomach and duodenum: Secondary | ICD-10-CM | POA: Diagnosis not present

## 2016-09-14 DIAGNOSIS — G4730 Sleep apnea, unspecified: Secondary | ICD-10-CM | POA: Insufficient documentation

## 2016-09-14 DIAGNOSIS — D500 Iron deficiency anemia secondary to blood loss (chronic): Secondary | ICD-10-CM | POA: Diagnosis not present

## 2016-09-14 DIAGNOSIS — Z79899 Other long term (current) drug therapy: Secondary | ICD-10-CM | POA: Diagnosis not present

## 2016-09-14 DIAGNOSIS — K210 Gastro-esophageal reflux disease with esophagitis: Secondary | ICD-10-CM | POA: Insufficient documentation

## 2016-09-14 DIAGNOSIS — K766 Portal hypertension: Secondary | ICD-10-CM | POA: Insufficient documentation

## 2016-09-14 DIAGNOSIS — Z794 Long term (current) use of insulin: Secondary | ICD-10-CM | POA: Insufficient documentation

## 2016-09-14 DIAGNOSIS — F17200 Nicotine dependence, unspecified, uncomplicated: Secondary | ICD-10-CM | POA: Diagnosis not present

## 2016-09-14 DIAGNOSIS — F419 Anxiety disorder, unspecified: Secondary | ICD-10-CM | POA: Diagnosis not present

## 2016-09-14 DIAGNOSIS — K3189 Other diseases of stomach and duodenum: Secondary | ICD-10-CM | POA: Insufficient documentation

## 2016-09-14 HISTORY — PX: ESOPHAGOGASTRODUODENOSCOPY (EGD) WITH PROPOFOL: SHX5813

## 2016-09-14 LAB — CBC
HEMATOCRIT: 26 % — AB (ref 40.0–52.0)
HEMOGLOBIN: 8.8 g/dL — AB (ref 13.0–18.0)
MCH: 32.1 pg (ref 26.0–34.0)
MCHC: 34 g/dL (ref 32.0–36.0)
MCV: 94.6 fL (ref 80.0–100.0)
Platelets: 109 10*3/uL — ABNORMAL LOW (ref 150–440)
RBC: 2.74 MIL/uL — ABNORMAL LOW (ref 4.40–5.90)
RDW: 16.4 % — AB (ref 11.5–14.5)
WBC: 4.3 10*3/uL (ref 3.8–10.6)

## 2016-09-14 LAB — GLUCOSE, CAPILLARY: GLUCOSE-CAPILLARY: 167 mg/dL — AB (ref 65–99)

## 2016-09-14 SURGERY — ESOPHAGOGASTRODUODENOSCOPY (EGD) WITH PROPOFOL
Anesthesia: General

## 2016-09-14 MED ORDER — SODIUM CHLORIDE 0.9 % IV SOLN
INTRAVENOUS | Status: DC
  Administered 2016-09-14 (×2): via INTRAVENOUS

## 2016-09-14 MED ORDER — LIDOCAINE 2% (20 MG/ML) 5 ML SYRINGE
INTRAMUSCULAR | Status: DC | PRN
  Administered 2016-09-14: 40 mg via INTRAVENOUS

## 2016-09-14 MED ORDER — FENTANYL CITRATE (PF) 100 MCG/2ML IJ SOLN
INTRAMUSCULAR | Status: DC | PRN
  Administered 2016-09-14: 50 ug via INTRAVENOUS

## 2016-09-14 MED ORDER — GLYCOPYRROLATE 0.2 MG/ML IJ SOLN
INTRAMUSCULAR | Status: DC | PRN
  Administered 2016-09-14: 0.2 mg via INTRAVENOUS

## 2016-09-14 MED ORDER — SODIUM CHLORIDE 0.9 % IV SOLN: INTRAVENOUS | Status: DC

## 2016-09-14 MED ORDER — PROPOFOL 10 MG/ML IV BOLUS
INTRAVENOUS | Status: DC | PRN
  Administered 2016-09-14: 100 mg via INTRAVENOUS

## 2016-09-14 MED ORDER — MIDAZOLAM HCL 5 MG/5ML IJ SOLN
INTRAMUSCULAR | Status: DC | PRN
  Administered 2016-09-14: 1 mg via INTRAVENOUS

## 2016-09-14 MED ORDER — PROPOFOL 500 MG/50ML IV EMUL
INTRAVENOUS | Status: DC | PRN
  Administered 2016-09-14: 140 ug/kg/min via INTRAVENOUS
  Administered 2016-09-14: 160 ug/kg/min via INTRAVENOUS
  Administered 2016-09-14: 180 ug/kg/min via INTRAVENOUS

## 2016-09-14 NOTE — Anesthesia Preprocedure Evaluation (Signed)
Anesthesia Evaluation  Patient identified by MRN, date of birth, ID band Patient awake    Reviewed: Allergy & Precautions, H&P , NPO status , Patient's Chart, lab work & pertinent test results, reviewed documented beta blocker date and time   History of Anesthesia Complications Negative for: history of anesthetic complications  Airway Mallampati: III  TM Distance: >3 FB Neck ROM: full    Dental  (+) Poor Dentition, Chipped   Pulmonary shortness of breath and with exertion, sleep apnea and Continuous Positive Airway Pressure Ventilation , Current Smoker,    Pulmonary exam normal breath sounds clear to auscultation       Cardiovascular Exercise Tolerance: Good hypertension, (-) angina+ DOE  (-) CAD, (-) Past MI, (-) Cardiac Stents and (-) CABG Normal cardiovascular exam(-) dysrhythmias (-) Valvular Problems/Murmurs Rhythm:regular Rate:Normal     Neuro/Psych PSYCHIATRIC DISORDERS negative neurological ROS     GI/Hepatic negative GI ROS, Neg liver ROS, hiatal hernia, GERD  Medicated and Controlled,(+) Cirrhosis   Esophageal Varices    ,   Endo/Other  diabetes, Well Controlled, Type 2, Insulin DependentMorbid obesity  Renal/GU negative Renal ROS  negative genitourinary   Musculoskeletal  (+) Arthritis , Osteoarthritis,    Abdominal   Peds negative pediatric ROS (+)  Hematology negative hematology ROS (+) anemia ,   Anesthesia Other Findings Cirrhosis Esophageal varices Thrombocytopenia diabetes  Reproductive/Obstetrics negative OB ROS                             Anesthesia Physical  Anesthesia Plan  ASA: III  Anesthesia Plan: General   Post-op Pain Management:    Induction: Intravenous  Airway Management Planned: Nasal Cannula  Additional Equipment:   Intra-op Plan:   Post-operative Plan:   Informed Consent: I have reviewed the patients History and Physical, chart, labs  and discussed the procedure including the risks, benefits and alternatives for the proposed anesthesia with the patient or authorized representative who has indicated his/her understanding and acceptance.   Dental advisory given  Plan Discussed with: CRNA and Surgeon  Anesthesia Plan Comments:         Anesthesia Quick Evaluation

## 2016-09-14 NOTE — H&P (Signed)
Primary Care Physician:  Madelyn Brunner, MD Primary Gastroenterologist:  Dr. Vira Agar  Pre-Procedure History & Physical: HPI:  Corey West is a 65 y.o. male is here for an endoscopy.   Past Medical History:  Diagnosis Date  . Anemia   . Anxiety   . Arthritis   . Cirrhosis ([place])   . Depression   . Diabetes mellitus without complication ([place])   . GERD (gastroesophageal reflux disease)   . History of hiatal hernia   . Sleep apnea     Past Surgical History:  Procedure Laterality Date  . ESOPHAGOGASTRODUODENOSCOPY N/A 02/09/2015   Procedure: ESOPHAGOGASTRODUODENOSCOPY (EGD);  Surgeon: Manya Silvas, MD;  Location: [facility] ENDOSCOPY;  Service: Endoscopy;  Laterality: N/A;  . ESOPHAGOGASTRODUODENOSCOPY N/A 07/22/2015   Procedure: ESOPHAGOGASTRODUODENOSCOPY (EGD);  Surgeon: Manya Silvas, MD;  Location: [facility] ENDOSCOPY;  Service: Endoscopy;  Laterality: N/A;  . ESOPHAGOGASTRODUODENOSCOPY (EGD) WITH PROPOFOL N/A 07/20/2015   Procedure: ESOPHAGOGASTRODUODENOSCOPY (EGD) WITH PROPOFOL;  Surgeon: Manya Silvas, MD;  Location: [facility] ENDOSCOPY;  Service: Endoscopy;  Laterality: N/A;  . ESOPHAGOGASTRODUODENOSCOPY (EGD) WITH PROPOFOL N/A 09/16/2015   Procedure: ESOPHAGOGASTRODUODENOSCOPY (EGD) WITH PROPOFOL;  Surgeon: Manya Silvas, MD;  Location: [facility] ENDOSCOPY;  Service: Endoscopy;  Laterality: N/A;  . ESOPHAGOGASTRODUODENOSCOPY (EGD) WITH PROPOFOL N/A 03/16/2016   Procedure: ESOPHAGOGASTRODUODENOSCOPY (EGD) WITH PROPOFOL;  Surgeon: Manya Silvas, MD;  Location: [facility] ENDOSCOPY;  Service: Endoscopy;  Laterality: N/A;  . TONSILLECTOMY    . TONSILLECTOMY AND ADENOIDECTOMY    . ULNAR NERVE TRANSPOSITION    . UVULOPALATOPHARYNGOPLASTY      Prior to Admission medications   Medication Sig Start Date End Date Taking? Authorizing Provider  benzonatate (TESSALON PERLES) 100 MG capsule Take 1 capsule (100 mg total) by mouth 3 (three) times daily as needed for cough. 10/12/15 10/11/16 Yes  Lisa Roca, MD  Ferrous Sulfate 140 (45 FE) MG TBCR Take 1 tablet by mouth 2 (two) times daily.    Yes Historical Provider, MD  furosemide (LASIX) 40 MG tablet Take 40 mg by mouth 2 (two) times daily.   Yes Historical Provider, MD  lactulose (CHRONULAC) 10 GM/15ML solution Take 20 g by mouth 2 (two) times daily.   Yes Historical Provider, MD  LORazepam (ATIVAN) 0.5 MG tablet Take 0.5 mg by mouth every 8 (eight) hours as needed for anxiety.   Yes Historical Provider, MD  metolazone (ZAROXOLYN) 2.5 MG tablet Take 2.5 mg by mouth every other day.   Yes Historical Provider, MD  nadolol (CORGARD) 40 MG tablet Take 20 mg by mouth daily.   Yes Historical Provider, MD  omeprazole (PRILOSEC) 40 MG capsule Take 40 mg by mouth 2 (two) times daily.   Yes Historical Provider, MD  rifaximin (XIFAXAN) 550 MG TABS tablet Take 550 mg by mouth 2 (two) times daily.   Yes Historical Provider, MD  simvastatin (ZOCOR) 40 MG tablet Take 40 mg by mouth at bedtime.   Yes Historical Provider, MD  spironolactone (ALDACTONE) 25 MG tablet Take 25 mg by mouth daily.   Yes Historical Provider, MD  sucralfate (CARAFATE) 1 GM/10ML suspension Take 10 mLs (1 g total) by mouth 4 (four) times daily -  with meals and at bedtime. 02/11/15  Yes Aldean Jewett, MD  traMADol (ULTRAM) 50 MG tablet Take by mouth every 6 (six) hours as needed.    Yes Historical Provider, MD  insulin aspart protamine- aspart (NOVOLOG MIX 70/30) (70-30) 100 UNIT/ML injection Inject 62-84 Units into the skin 2 (  two) times daily with a meal. Pt uses 84 units with breakfast and 62 units with supper.    Historical Provider, MD    Allergies as of 09/10/2016  . (Not on File)    Family History  Problem Relation Age of Onset  . Diabetes Other     Social History   Social History  . Marital status: Single    Spouse name: N/A  . Number of children: N/A  . Years of education: N/A   Occupational History  . Not on file.   Social History Main Topics  .  Smoking status: Current Some Day Smoker  . Smokeless tobacco: Never Used  . Alcohol use No  . Drug use: No  . Sexual activity: Not on file   Other Topics Concern  . Not on file   Social History Narrative  . No narrative on file    Review of Systems: See HPI, otherwise negative ROS  Physical Exam: BP (!) 137/49   Pulse 70   Temp 98 F (36.7 C) (Tympanic)   Resp 19   Ht 5\' 10"  (1.778 m)   Wt (!) 152.9 kg (337 lb)   SpO2 100%   BMI 48.35 kg/m  General:   Alert,  pleasant and cooperative in NAD Head:  Normocephalic and atraumatic. Neck:  Supple; no masses or thyromegaly. Lungs:  Clear throughout to auscultation.    Heart:  Regular rate and rhythm. Abdomen:  Soft, nontender and nondistended. Normal bowel sounds, without guarding, and without rebound.   Neurologic:  Alert and  oriented x4;  grossly normal neurologically.  Impression/Plan: Corey West is here for an endoscopy to be performed for recurrent UGI bleeding from abnormal gastric polyps  Risks, benefits, limitations, and alternatives regarding  endoscopy have been reviewed with the patient.  Questions have been answered.  All parties agreeable.   [name], MD  09/14/2016, 9:04 AM

## 2016-09-14 NOTE — Transfer of Care (Signed)
Immediate Anesthesia Transfer of Care Note  Patient: Corey West  Procedure(s) Performed: Procedure(s): ESOPHAGOGASTRODUODENOSCOPY (EGD) WITH PROPOFOL (N/A)  Patient Location: PACU and Endoscopy Unit  Anesthesia Type:General  Level of Consciousness: sedated  Airway & Oxygen Therapy: Patient Spontanous Breathing and Patient connected to nasal cannula oxygen  Post-op Assessment: Report given to RN and Post -op Vital signs reviewed and stable  Post vital signs: Reviewed and stable  Last Vitals:  Vitals:   09/14/16 0817  BP: (!) 137/49  Pulse: 70  Resp: 19  Temp: 36.7 C    Last Pain:  Vitals:   09/14/16 0817  TempSrc: Tympanic         Complications: No apparent anesthesia complications

## 2016-09-14 NOTE — Op Note (Signed)
[facility] Gastroenterology Patient Name: Corey West Procedure Date: 09/14/2016 9:08 AM MRN: [id number] Account #: [id number] Date of Birth: [birth year] Admit Type: Outpatient Age: 65 Room: [facility] ENDO ROOM 4 Gender: Male Note Status: Finalized Procedure:            Upper GI endoscopy Indications:          Iron deficiency anemia secondary to chronic blood loss,                        [place] Providers:            Manya Silvas, MD Referring MD:         Hewitt Blade. Sarina Ser, MD (Referring MD) Medicines:            Propofol per Anesthesia Complications:        No immediate complications. Procedure:            Pre-Anesthesia Assessment:                       - After reviewing the risks and benefits, the patient                        was deemed in satisfactory condition to undergo the                        procedure.                       After obtaining informed consent, the endoscope was                        passed under direct vision. Throughout the procedure,                        the patient's blood pressure, pulse, and oxygen                        saturations were monitored continuously. The Endoscope                        was introduced through the mouth, and advanced to the                        duodenal bulb. The upper GI endoscopy was accomplished                        without difficulty. The patient tolerated the procedure                        well. Findings:      LA Grade A (one or more mucosal breaks less than 5 mm, not extending       between tops of 2 mucosal folds) esophagitis with no bleeding was found       35 cm from the incisors.      Six large sessile polyps with no bleeding and stigmata of recent       bleeding were found in the gastric antrum.      Multiple 25 mm sessile polyps with bleeding and stigmata of recent       bleeding were found in the gastric antrum. These polyps were removed  with a hot snare. Resection and  retrieval were complete.      Severe portal hypertensive gastropathy was found in the gastric body and       in the gastric antrum.      Diffuse mildly congested mucosa without active bleeding and with no       stigmata of bleeding was found in the duodenal bulb. Impression:           - LA Grade A reflux esophagitis. Rule out Barrett's                        esophagus.                       - Six gastric polyps.                       - Multiple gastric polyps. Resected and retrieved.                       - Portal hypertensive gastropathy.                       - Congested duodenal mucosa. Recommendation:       - Await pathology results. Manya Silvas, MD 09/14/2016 9:47:45 AM This report has been signed electronically. Number of Addenda: 0 Note Initiated On: 09/14/2016 9:08 AM      [facility]

## 2016-09-14 NOTE — Anesthesia Postprocedure Evaluation (Signed)
Anesthesia Post Note  Patient: JAIDEEP POLLACK  Procedure(s) Performed: Procedure(s) (LRB): ESOPHAGOGASTRODUODENOSCOPY (EGD) WITH PROPOFOL (N/A)  Patient location during evaluation: Endoscopy Anesthesia Type: General Level of consciousness: awake and alert Pain management: pain level controlled Vital Signs Assessment: post-procedure vital signs reviewed and stable Respiratory status: spontaneous breathing, nonlabored ventilation, respiratory function stable and patient connected to nasal cannula oxygen Cardiovascular status: blood pressure returned to baseline and stable Postop Assessment: no signs of nausea or vomiting Anesthetic complications: no    Last Vitals:  Vitals:   09/14/16 1018 09/14/16 1028  BP: (!) 120/55 (!) 143/66  Pulse: 73 71  Resp: 15 15  Temp:      Last Pain:  Vitals:   09/14/16 0948  TempSrc: Tympanic  PainSc: Asleep                 Martha Clan

## 2016-09-15 ENCOUNTER — Encounter: Payer: Self-pay | Admitting: Unknown Physician Specialty

## 2016-09-17 LAB — SURGICAL PATHOLOGY

## 2016-10-17 ENCOUNTER — Ambulatory Visit
Admission: RE | Admit: 2016-10-17 | Discharge: 2016-10-17 | Disposition: A | Discharge status: Home / Self Care | Payer: BLUE CROSS/BLUE SHIELD | Source: Ambulatory Visit | Attending: Unknown Physician Specialty | Admitting: Unknown Physician Specialty

## 2016-10-17 DIAGNOSIS — D500 Iron deficiency anemia secondary to blood loss (chronic): Secondary | ICD-10-CM | POA: Diagnosis present

## 2016-10-17 DIAGNOSIS — Q2739 Arteriovenous malformation, other site: Secondary | ICD-10-CM | POA: Insufficient documentation

## 2016-10-17 LAB — HEMOGLOBIN: Hemoglobin: 6.6 g/dL — ABNORMAL LOW (ref 13.0–18.0)

## 2016-10-17 LAB — PREPARE RBC (CROSSMATCH)

## 2016-10-17 MED ORDER — SODIUM CHLORIDE FLUSH 0.9 % IV SOLN
INTRAVENOUS | Status: AC
  Filled 2016-10-17: qty 10

## 2016-10-18 LAB — TYPE AND SCREEN
BLOOD PRODUCT EXPIRATION DATE: 201801292359
ISSUE DATE / TIME: 201801170952
UNIT TYPE AND RH: 600

## 2016-10-23 ENCOUNTER — Ambulatory Visit
Admission: RE | Admit: 2016-10-23 | Discharge: 2016-10-23 | Disposition: A | Discharge status: Home / Self Care | Payer: BLUE CROSS/BLUE SHIELD | Source: Ambulatory Visit | Attending: Unknown Physician Specialty | Admitting: Unknown Physician Specialty

## 2016-10-23 DIAGNOSIS — D500 Iron deficiency anemia secondary to blood loss (chronic): Secondary | ICD-10-CM | POA: Diagnosis present

## 2016-10-23 DIAGNOSIS — K5521 Angiodysplasia of colon with hemorrhage: Secondary | ICD-10-CM | POA: Diagnosis present

## 2016-10-23 LAB — PREPARE RBC (CROSSMATCH)

## 2016-10-23 LAB — HEMOGLOBIN AND HEMATOCRIT, BLOOD
HCT: 21.3 % — ABNORMAL LOW (ref 40.0–52.0)
HCT: 23.9 % — ABNORMAL LOW (ref 40.0–52.0)
HEMOGLOBIN: 7.2 g/dL — AB (ref 13.0–18.0)
Hemoglobin: 8.2 g/dL — ABNORMAL LOW (ref 13.0–18.0)

## 2016-10-23 MED ORDER — SODIUM CHLORIDE 0.9 % IV SOLN: Freq: Once | INTRAVENOUS | Status: DC

## 2016-10-23 MED ORDER — SODIUM CHLORIDE 0.9 % IV SOLN
Freq: Once | INTRAVENOUS | Status: AC
  Administered 2016-10-23: 11:00:00 via INTRAVENOUS

## 2016-10-23 MED ORDER — SODIUM CHLORIDE FLUSH 0.9 % IV SOLN
INTRAVENOUS | Status: AC
  Filled 2016-10-23: qty 10

## 2016-10-23 NOTE — OR Nursing (Signed)
Patient with c/o leg cramping; states he would normally take a little tonic water to help with this problem.  Patient was helped upright to stand on his cane and flex his legs.  Patient reports little improvement with the positioning change.  Patient request to continue to stand; noted with steady gait while holding the cane.  Will continue to monitor.

## 2016-10-24 LAB — TYPE AND SCREEN
ABO/RH(D): A NEG
Antibody Screen: NEGATIVE
UNIT DIVISION: 0
UNIT DIVISION: 0

## 2016-10-31 ENCOUNTER — Encounter
Admission: RE | Admit: 2016-10-31 | Discharge: 2016-10-31 | Disposition: A | Discharge status: Home / Self Care | Payer: BLUE CROSS/BLUE SHIELD | Source: Ambulatory Visit | Attending: Unknown Physician Specialty | Admitting: Unknown Physician Specialty

## 2016-10-31 DIAGNOSIS — D500 Iron deficiency anemia secondary to blood loss (chronic): Secondary | ICD-10-CM | POA: Diagnosis not present

## 2016-10-31 DIAGNOSIS — K5521 Angiodysplasia of colon with hemorrhage: Secondary | ICD-10-CM | POA: Diagnosis present

## 2016-10-31 LAB — PREPARE RBC (CROSSMATCH)

## 2016-10-31 MED ORDER — SODIUM CHLORIDE 0.9 % IV SOLN: Freq: Once | INTRAVENOUS | Status: DC

## 2016-11-01 ENCOUNTER — Ambulatory Visit
Admission: RE | Admit: 2016-11-01 | Discharge: 2016-11-01 | Disposition: A | Discharge status: Home / Self Care | Payer: BLUE CROSS/BLUE SHIELD | Source: Ambulatory Visit | Attending: Unknown Physician Specialty | Admitting: Unknown Physician Specialty

## 2016-11-01 DIAGNOSIS — D500 Iron deficiency anemia secondary to blood loss (chronic): Secondary | ICD-10-CM | POA: Diagnosis not present

## 2016-11-01 DIAGNOSIS — K5521 Angiodysplasia of colon with hemorrhage: Secondary | ICD-10-CM | POA: Insufficient documentation

## 2016-11-01 LAB — PREPARE RBC (CROSSMATCH)

## 2016-11-01 MED ORDER — SODIUM CHLORIDE 0.9 % IV SOLN: Freq: Once | INTRAVENOUS | Status: DC

## 2016-11-01 MED ORDER — SODIUM CHLORIDE FLUSH 0.9 % IV SOLN
INTRAVENOUS | Status: AC
  Filled 2016-11-01: qty 10

## 2016-11-02 ENCOUNTER — Encounter: Payer: Self-pay | Admitting: *Deleted

## 2016-11-02 LAB — TYPE AND SCREEN
ABO/RH(D): A NEG
Antibody Screen: NEGATIVE
EXTEND SAMPLE REASON: TRANSFUSED
UNIT DIVISION: 0
UNIT DIVISION: 0

## 2016-11-05 ENCOUNTER — Ambulatory Visit: Payer: BLUE CROSS/BLUE SHIELD | Admitting: Anesthesiology

## 2016-11-05 ENCOUNTER — Encounter
Admission: RE | Disposition: A | Discharge status: Home / Self Care | Payer: Self-pay | Source: Ambulatory Visit | Attending: Unknown Physician Specialty

## 2016-11-05 ENCOUNTER — Encounter: Payer: Self-pay | Admitting: Anesthesiology

## 2016-11-05 ENCOUNTER — Ambulatory Visit
Admission: RE | Admit: 2016-11-05 | Discharge: 2016-11-05 | Disposition: A | Discharge status: Home / Self Care | Payer: BLUE CROSS/BLUE SHIELD | Source: Ambulatory Visit | Attending: Unknown Physician Specialty | Admitting: Unknown Physician Specialty

## 2016-11-05 DIAGNOSIS — K7460 Unspecified cirrhosis of liver: Secondary | ICD-10-CM | POA: Insufficient documentation

## 2016-11-05 DIAGNOSIS — K219 Gastro-esophageal reflux disease without esophagitis: Secondary | ICD-10-CM | POA: Insufficient documentation

## 2016-11-05 DIAGNOSIS — E119 Type 2 diabetes mellitus without complications: Secondary | ICD-10-CM | POA: Insufficient documentation

## 2016-11-05 DIAGNOSIS — D649 Anemia, unspecified: Secondary | ICD-10-CM | POA: Diagnosis not present

## 2016-11-05 DIAGNOSIS — Q2733 Arteriovenous malformation of digestive system vessel: Secondary | ICD-10-CM | POA: Diagnosis not present

## 2016-11-05 DIAGNOSIS — K317 Polyp of stomach and duodenum: Secondary | ICD-10-CM | POA: Diagnosis not present

## 2016-11-05 DIAGNOSIS — Z794 Long term (current) use of insulin: Secondary | ICD-10-CM | POA: Diagnosis not present

## 2016-11-05 DIAGNOSIS — Z79899 Other long term (current) drug therapy: Secondary | ICD-10-CM | POA: Diagnosis not present

## 2016-11-05 DIAGNOSIS — K31811 Angiodysplasia of stomach and duodenum with bleeding: Secondary | ICD-10-CM | POA: Insufficient documentation

## 2016-11-05 DIAGNOSIS — F419 Anxiety disorder, unspecified: Secondary | ICD-10-CM | POA: Diagnosis not present

## 2016-11-05 DIAGNOSIS — F17200 Nicotine dependence, unspecified, uncomplicated: Secondary | ICD-10-CM | POA: Diagnosis not present

## 2016-11-05 DIAGNOSIS — K922 Gastrointestinal hemorrhage, unspecified: Secondary | ICD-10-CM | POA: Diagnosis present

## 2016-11-05 DIAGNOSIS — Z6841 Body Mass Index (BMI) 40.0 and over, adult: Secondary | ICD-10-CM | POA: Diagnosis not present

## 2016-11-05 DIAGNOSIS — G4730 Sleep apnea, unspecified: Secondary | ICD-10-CM | POA: Diagnosis not present

## 2016-11-05 HISTORY — PX: ESOPHAGOGASTRODUODENOSCOPY (EGD) WITH PROPOFOL: SHX5813

## 2016-11-05 LAB — GLUCOSE, CAPILLARY: GLUCOSE-CAPILLARY: 100 mg/dL — AB (ref 65–99)

## 2016-11-05 SURGERY — ESOPHAGOGASTRODUODENOSCOPY (EGD) WITH PROPOFOL
Anesthesia: General

## 2016-11-05 MED ORDER — MIDAZOLAM HCL 5 MG/5ML IJ SOLN
INTRAMUSCULAR | Status: DC | PRN
  Administered 2016-11-05 (×2): 1 mg via INTRAVENOUS

## 2016-11-05 MED ORDER — GLYCOPYRROLATE 0.2 MG/ML IJ SOLN
INTRAMUSCULAR | Status: DC | PRN
  Administered 2016-11-05: 0.1 mg via INTRAVENOUS

## 2016-11-05 MED ORDER — PROPOFOL 500 MG/50ML IV EMUL
INTRAVENOUS | Status: DC | PRN
  Administered 2016-11-05: 75 ug/kg/min via INTRAVENOUS

## 2016-11-05 MED ORDER — PROPOFOL 10 MG/ML IV BOLUS
INTRAVENOUS | Status: DC | PRN
  Administered 2016-11-05: 20 mg via INTRAVENOUS
  Administered 2016-11-05: 30 mg via INTRAVENOUS

## 2016-11-05 MED ORDER — FENTANYL CITRATE (PF) 100 MCG/2ML IJ SOLN
INTRAMUSCULAR | Status: AC
  Filled 2016-11-05: qty 2

## 2016-11-05 MED ORDER — MIDAZOLAM HCL 2 MG/2ML IJ SOLN
INTRAMUSCULAR | Status: AC
  Filled 2016-11-05: qty 2

## 2016-11-05 MED ORDER — FENTANYL CITRATE (PF) 100 MCG/2ML IJ SOLN
INTRAMUSCULAR | Status: DC | PRN
  Administered 2016-11-05: 50 ug via INTRAVENOUS

## 2016-11-05 MED ORDER — SODIUM CHLORIDE 0.9 % IV SOLN
INTRAVENOUS | Status: DC
  Administered 2016-11-05: 1000 mL via INTRAVENOUS

## 2016-11-05 MED ORDER — LIDOCAINE HCL (PF) 2 % IJ SOLN
INTRAMUSCULAR | Status: DC | PRN
  Administered 2016-11-05: 50 mg

## 2016-11-05 MED ORDER — PROPOFOL 500 MG/50ML IV EMUL
INTRAVENOUS | Status: AC
  Filled 2016-11-05: qty 50

## 2016-11-05 NOTE — Anesthesia Post-op Follow-up Note (Cosign Needed)
Anesthesia QCDR form completed.        

## 2016-11-05 NOTE — Transfer of Care (Signed)
Immediate Anesthesia Transfer of Care Note  Patient: Corey West  Procedure(s) Performed: Procedure(s): ESOPHAGOGASTRODUODENOSCOPY (EGD) WITH PROPOFOL (N/A)  Patient Location: PACU  Anesthesia Type:General  Level of Consciousness: sedated  Airway & Oxygen Therapy: Patient Spontanous Breathing and Patient connected to nasal cannula oxygen  Post-op Assessment: Report given to RN and Post -op Vital signs reviewed and stable  Post vital signs: Reviewed and stable  Last Vitals:  Vitals:   11/05/16 0703  BP: (!) 149/55  Pulse: 82  Resp: (!) 22  Temp: 37.3 C    Last Pain:  Vitals:   11/05/16 0703  TempSrc: Tympanic         Complications: No apparent anesthesia complications

## 2016-11-05 NOTE — Anesthesia Preprocedure Evaluation (Signed)
Anesthesia Evaluation  Patient identified by MRN, date of birth, ID band Patient awake    Reviewed: Allergy & Precautions, H&P , NPO status , Patient's Chart, lab work & pertinent test results, reviewed documented beta blocker date and time   History of Anesthesia Complications Negative for: history of anesthetic complications  Airway Mallampati: III  TM Distance: >3 FB Neck ROM: full    Dental  (+) Poor Dentition, Chipped   Pulmonary shortness of breath and with exertion, sleep apnea and Continuous Positive Airway Pressure Ventilation , Current Smoker,    Pulmonary exam normal breath sounds clear to auscultation       Cardiovascular Exercise Tolerance: Good hypertension, (-) angina+ DOE  (-) CAD, (-) Past MI, (-) Cardiac Stents and (-) CABG Normal cardiovascular exam(-) dysrhythmias (-) Valvular Problems/Murmurs Rhythm:regular Rate:Normal     Neuro/Psych PSYCHIATRIC DISORDERS negative neurological ROS     GI/Hepatic negative GI ROS, Neg liver ROS, hiatal hernia, GERD  Medicated and Controlled,(+) Cirrhosis   Esophageal Varices    ,   Endo/Other  diabetes, Well Controlled, Type 2, Insulin DependentMorbid obesity  Renal/GU negative Renal ROS  negative genitourinary   Musculoskeletal  (+) Arthritis , Osteoarthritis,    Abdominal   Peds negative pediatric ROS (+)  Hematology negative hematology ROS (+) anemia ,   Anesthesia Other Findings Cirrhosis Esophageal varices Thrombocytopenia diabetes  Reproductive/Obstetrics negative OB ROS                             Anesthesia Physical  Anesthesia Plan  ASA: III  Anesthesia Plan: General   Post-op Pain Management:    Induction: Intravenous  Airway Management Planned: Nasal Cannula  Additional Equipment:   Intra-op Plan:   Post-operative Plan:   Informed Consent: I have reviewed the patients History and Physical, chart, labs  and discussed the procedure including the risks, benefits and alternatives for the proposed anesthesia with the patient or authorized representative who has indicated his/her understanding and acceptance.   Dental advisory given  Plan Discussed with: CRNA and Surgeon  Anesthesia Plan Comments:         Anesthesia Quick Evaluation

## 2016-11-05 NOTE — H&P (Signed)
Primary Care Physician:  Madelyn Brunner, MD Primary Gastroenterologist:  Dr. Vira Agar  Pre-Procedure History & Physical: HPI:  Corey West is a 66 y.o. male is here for an endoscopy.   Past Medical History:  Diagnosis Date  . Anemia   . Anxiety   . Arthritis   . Cirrhosis ([place])   . Depression   . Diabetes mellitus without complication ([place])   . GERD (gastroesophageal reflux disease)   . History of hiatal hernia   . Sleep apnea     Past Surgical History:  Procedure Laterality Date  . ESOPHAGOGASTRODUODENOSCOPY N/A 02/09/2015   Procedure: ESOPHAGOGASTRODUODENOSCOPY (EGD);  Surgeon: Manya Silvas, MD;  Location: [facility] ENDOSCOPY;  Service: Endoscopy;  Laterality: N/A;  . ESOPHAGOGASTRODUODENOSCOPY N/A 07/22/2015   Procedure: ESOPHAGOGASTRODUODENOSCOPY (EGD);  Surgeon: Manya Silvas, MD;  Location: [facility] ENDOSCOPY;  Service: Endoscopy;  Laterality: N/A;  . ESOPHAGOGASTRODUODENOSCOPY (EGD) WITH PROPOFOL N/A 07/20/2015   Procedure: ESOPHAGOGASTRODUODENOSCOPY (EGD) WITH PROPOFOL;  Surgeon: Manya Silvas, MD;  Location: [facility] ENDOSCOPY;  Service: Endoscopy;  Laterality: N/A;  . ESOPHAGOGASTRODUODENOSCOPY (EGD) WITH PROPOFOL N/A 09/16/2015   Procedure: ESOPHAGOGASTRODUODENOSCOPY (EGD) WITH PROPOFOL;  Surgeon: Manya Silvas, MD;  Location: [facility] ENDOSCOPY;  Service: Endoscopy;  Laterality: N/A;  . ESOPHAGOGASTRODUODENOSCOPY (EGD) WITH PROPOFOL N/A 03/16/2016   Procedure: ESOPHAGOGASTRODUODENOSCOPY (EGD) WITH PROPOFOL;  Surgeon: Manya Silvas, MD;  Location: [facility] ENDOSCOPY;  Service: Endoscopy;  Laterality: N/A;  . ESOPHAGOGASTRODUODENOSCOPY (EGD) WITH PROPOFOL N/A 09/14/2016   Procedure: ESOPHAGOGASTRODUODENOSCOPY (EGD) WITH PROPOFOL;  Surgeon: Manya Silvas, MD;  Location: [facility] ENDOSCOPY;  Service: Endoscopy;  Laterality: N/A;  . TONSILLECTOMY    . TONSILLECTOMY AND ADENOIDECTOMY    . ULNAR NERVE TRANSPOSITION    . UVULOPALATOPHARYNGOPLASTY      Prior to Admission  medications   Medication Sig Start Date End Date Taking? Authorizing Provider  Ferrous Sulfate 140 (45 FE) MG TBCR Take 1 tablet by mouth 2 (two) times daily.    Yes Historical Provider, MD  furosemide (LASIX) 40 MG tablet Take 40 mg by mouth 2 (two) times daily.   Yes Historical Provider, MD  insulin aspart protamine- aspart (NOVOLOG MIX 70/30) (70-30) 100 UNIT/ML injection Inject 62-84 Units into the skin 2 (two) times daily with a meal. Pt uses 84 units with breakfast and 62 units with supper.   Yes Historical Provider, MD  lactulose (CHRONULAC) 10 GM/15ML solution Take 20 g by mouth 2 (two) times daily.   Yes Historical Provider, MD  LORazepam (ATIVAN) 0.5 MG tablet Take 0.5 mg by mouth every 8 (eight) hours as needed for anxiety.   Yes Historical Provider, MD  metolazone (ZAROXOLYN) 2.5 MG tablet Take 2.5 mg by mouth every other day.   Yes Historical Provider, MD  nadolol (CORGARD) 40 MG tablet Take 20 mg by mouth daily.   Yes Historical Provider, MD  omeprazole (PRILOSEC) 40 MG capsule Take 40 mg by mouth 2 (two) times daily.   Yes Historical Provider, MD  rifaximin (XIFAXAN) 550 MG TABS tablet Take 550 mg by mouth 2 (two) times daily.   Yes Historical Provider, MD  simvastatin (ZOCOR) 40 MG tablet Take 40 mg by mouth at bedtime.   Yes Historical Provider, MD  spironolactone (ALDACTONE) 25 MG tablet Take 25 mg by mouth daily.   Yes Historical Provider, MD  sucralfate (CARAFATE) 1 GM/10ML suspension Take 10 mLs (1 g total) by mouth 4 (four) times daily -  with meals and at bedtime. 02/11/15  Yes Barnetta Chapel  Christen Butter, MD  traMADol (ULTRAM) 50 MG tablet Take by mouth every 6 (six) hours as needed.    Yes Historical Provider, MD    Allergies as of 10/30/2016  . (No Known Allergies)    Family History  Problem Relation Age of Onset  . Diabetes Other     Social History   Social History  . Marital status: Single    Spouse name: N/A  . Number of children: N/A  . Years of education: N/A    Occupational History  . Not on file.   Social History Main Topics  . Smoking status: Current Some Day Smoker  . Smokeless tobacco: Never Used  . Alcohol use No  . Drug use: No  . Sexual activity: Not on file   Other Topics Concern  . Not on file   Social History Narrative  . No narrative on file    Review of Systems: See HPI, otherwise negative ROS  Physical Exam: BP (!) 149/55   Pulse 82   Temp 99.2 F (37.3 C) (Tympanic)   Resp (!) 22   Ht 5\' 10"  (1.778 m)   Wt (!) 149.7 kg (330 lb)   SpO2 100%   BMI 47.35 kg/m  General:   Alert,  pleasant and cooperative in NAD, very obese, signif fluid swelling in legs. Head:  Normocephalic and atraumatic. Neck:  Supple; no masses or thyromegaly. Lungs:  Clear throughout to auscultation.    Heart:  Regular rate and rhythm. Abdomen:  Soft, nontender and nondistended. Normal bowel sounds, without guarding, and without rebound.   Neurologic:  Alert and  oriented x4;  grossly normal neurologically.  Impression/Plan: Corey West is here for an endoscopy to be performed for recurrent UGI bleeding from stomach  Risks, benefits, limitations, and alternatives regarding  endoscopy have been reviewed with the patient.  Questions have been answered.  All parties agreeable.   [name], MD  11/05/2016, 7:38 AM

## 2016-11-05 NOTE — Op Note (Signed)
[facility] Patient Name: Corey West Procedure Date: 11/05/2016 7:38 AM MRN: [id number] Account #: [id number] Date of Birth: 1951-07-02 Admit Type: Outpatient Age: 66 Room: [facility] ENDO ROOM 1 Gender: Male Note Status: Finalized Procedure:            Upper GI endoscopy Indications:          Suspected upper gastrointestinal bleeding Providers:            Manya Silvas, MD Referring MD:         Hewitt Blade. Sarina Ser, MD (Referring MD) Medicines:            Propofol per Anesthesia Complications:        No immediate complications. Procedure:            Pre-Anesthesia Assessment:                       - After reviewing the risks and benefits, the patient                        was deemed in satisfactory condition to undergo the                        procedure.                       After obtaining informed consent, the endoscope was                        passed under direct vision. Throughout the procedure,                        the patient's blood pressure, pulse, and oxygen                        saturations were monitored continuously. The Endoscope                        was introduced through the mouth, and advanced to the                        second part of duodenum. The upper GI endoscopy was                        accomplished without difficulty. The patient tolerated                        the procedure well. Findings:      The examined esophagus was normal.      A single 12 mm semi-sessile polyp with no bleeding and no stigmata of       recent bleeding was found in the gastric body. The polyp was removed       with a hot snare. Resection was complete, but the polyp tissue was not       retrieved.      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 Coagulation for tissue destruction using argon       plasma at 0.5 liters/minute and 20 watts was successful. Multiple small       flat AVM not  bleeding scattered in the stomach were  not treated.      Duodenal bulb had AVM not bleeding and not treated. Impression:           - Normal esophagus.                       - A single gastric polyp. Complete resection. Polyp                        tissue not retrieved.                       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                        Treated with argon plasma coagulation (APC). Recommendation:       - The findings and recommendations were discussed with                        the patient's family. Full liquid diet for 3 days. Manya Silvas, MD 11/05/2016 8:12:58 AM This report has been signed electronically. Number of Addenda: 0 Note Initiated On: 11/05/2016 7:38 AM      [facility]

## 2016-11-05 NOTE — OR Nursing (Signed)
Large  Gastric polyp hot snared, not removed per MD

## 2016-11-05 NOTE — Anesthesia Postprocedure Evaluation (Signed)
Anesthesia Post Note  Patient: Corey West  Procedure(s) Performed: Procedure(s) (LRB): ESOPHAGOGASTRODUODENOSCOPY (EGD) WITH PROPOFOL (N/A)  Patient location during evaluation: Endoscopy Anesthesia Type: General Level of consciousness: awake and alert Pain management: pain level controlled Vital Signs Assessment: post-procedure vital signs reviewed and stable Respiratory status: spontaneous breathing, nonlabored ventilation, respiratory function stable and patient connected to nasal cannula oxygen Cardiovascular status: blood pressure returned to baseline and stable Postop Assessment: no signs of nausea or vomiting Anesthetic complications: no     Last Vitals:  Vitals:   11/05/16 0830 11/05/16 0850  BP: (!) 155/66 (!) (P) 144/57  Pulse: 79   Resp: 16   Temp:      Last Pain:  Vitals:   11/05/16 0810  TempSrc: Tympanic                 Martha Clan

## 2016-11-06 ENCOUNTER — Encounter: Payer: Self-pay | Admitting: Unknown Physician Specialty

## 2016-11-15 ENCOUNTER — Emergency Department: Payer: BLUE CROSS/BLUE SHIELD

## 2016-11-15 ENCOUNTER — Encounter: Payer: Self-pay | Admitting: Emergency Medicine

## 2016-11-15 ENCOUNTER — Emergency Department
Admission: EM | Admit: 2016-11-15 | Discharge: 2016-11-16 | Disposition: A | Discharge status: Home / Self Care | Payer: BLUE CROSS/BLUE SHIELD | Attending: Emergency Medicine | Admitting: Emergency Medicine

## 2016-11-15 DIAGNOSIS — R1110 Vomiting, unspecified: Secondary | ICD-10-CM | POA: Diagnosis not present

## 2016-11-15 DIAGNOSIS — E1122 Type 2 diabetes mellitus with diabetic chronic kidney disease: Secondary | ICD-10-CM | POA: Diagnosis not present

## 2016-11-15 DIAGNOSIS — Z79899 Other long term (current) drug therapy: Secondary | ICD-10-CM | POA: Diagnosis not present

## 2016-11-15 DIAGNOSIS — R109 Unspecified abdominal pain: Secondary | ICD-10-CM | POA: Diagnosis not present

## 2016-11-15 DIAGNOSIS — M1990 Unspecified osteoarthritis, unspecified site: Secondary | ICD-10-CM | POA: Diagnosis not present

## 2016-11-15 DIAGNOSIS — M545 Low back pain: Secondary | ICD-10-CM | POA: Diagnosis not present

## 2016-11-15 DIAGNOSIS — N189 Chronic kidney disease, unspecified: Secondary | ICD-10-CM | POA: Insufficient documentation

## 2016-11-15 DIAGNOSIS — F17200 Nicotine dependence, unspecified, uncomplicated: Secondary | ICD-10-CM | POA: Diagnosis not present

## 2016-11-15 LAB — CBC WITH DIFFERENTIAL/PLATELET
BASOS ABS: 0 10*3/uL (ref 0–0.1)
Basophils Relative: 0 %
Eosinophils Absolute: 0 10*3/uL (ref 0–0.7)
Eosinophils Relative: 1 %
HEMATOCRIT: 24 % — AB (ref 40.0–52.0)
Hemoglobin: 8.6 g/dL — ABNORMAL LOW (ref 13.0–18.0)
LYMPHS ABS: 0.5 10*3/uL — AB (ref 1.0–3.6)
Lymphocytes Relative: 9 %
MCH: 33 pg (ref 26.0–34.0)
MCHC: 35.8 g/dL (ref 32.0–36.0)
MCV: 92 fL (ref 80.0–100.0)
MONO ABS: 0.5 10*3/uL (ref 0.2–1.0)
Monocytes Relative: 8 %
NEUTROS ABS: 4.7 10*3/uL (ref 1.4–6.5)
Neutrophils Relative %: 82 %
PLATELETS: 139 10*3/uL — AB (ref 150–440)
RBC: 2.61 MIL/uL — AB (ref 4.40–5.90)
RDW: 14.7 % — AB (ref 11.5–14.5)
WBC: 5.7 10*3/uL (ref 3.8–10.6)

## 2016-11-15 LAB — PROTIME-INR
INR: 1.24
Prothrombin Time: 15.7 seconds — ABNORMAL HIGH (ref 11.4–15.2)

## 2016-11-15 LAB — COMPREHENSIVE METABOLIC PANEL
ALT: 24 U/L (ref 17–63)
AST: 51 U/L — ABNORMAL HIGH (ref 15–41)
Albumin: 3.1 g/dL — ABNORMAL LOW (ref 3.5–5.0)
Alkaline Phosphatase: 65 U/L (ref 38–126)
Anion gap: 9 (ref 5–15)
BILIRUBIN TOTAL: 0.8 mg/dL (ref 0.3–1.2)
BUN: 20 mg/dL (ref 6–20)
CHLORIDE: 97 mmol/L — AB (ref 101–111)
CO2: 27 mmol/L (ref 22–32)
Calcium: 9.2 mg/dL (ref 8.9–10.3)
Creatinine, Ser: 2.1 mg/dL — ABNORMAL HIGH (ref 0.61–1.24)
GFR, EST AFRICAN AMERICAN: 37 mL/min — AB (ref >60)
GFR, EST NON AFRICAN AMERICAN: 32 mL/min — AB (ref >60)
Glucose, Bld: 101 mg/dL — ABNORMAL HIGH (ref 65–99)
Potassium: 3.8 mmol/L (ref 3.5–5.1)
Sodium: 133 mmol/L — ABNORMAL LOW (ref 135–145)
TOTAL PROTEIN: 6.7 g/dL (ref 6.5–8.1)

## 2016-11-15 LAB — URINALYSIS, COMPLETE (UACMP) WITH MICROSCOPIC
Bacteria, UA: NONE SEEN
Bilirubin Urine: NEGATIVE
GLUCOSE, UA: NEGATIVE mg/dL
Hgb urine dipstick: NEGATIVE
KETONES UR: NEGATIVE mg/dL
Leukocytes, UA: NEGATIVE
Nitrite: NEGATIVE
PH: 7 (ref 5.0–8.0)
Protein, ur: NEGATIVE mg/dL
RBC / HPF: NONE SEEN RBC/hpf (ref 0–5)
SPECIFIC GRAVITY, URINE: 1.009 (ref 1.005–1.030)

## 2016-11-15 LAB — LIPASE, BLOOD: LIPASE: 22 U/L (ref 11–51)

## 2016-11-15 MED ORDER — OXYCODONE-ACETAMINOPHEN 5-325 MG PO TABS: 2.0000 | ORAL_TABLET | Freq: Once | ORAL | Status: DC

## 2016-11-15 MED ORDER — OXYCODONE HCL 5 MG PO TABS: 5.0000 mg | ORAL_TABLET | Freq: Three times a day (TID) | ORAL | 0 refills | Status: DC | PRN

## 2016-11-15 MED ORDER — PREDNISONE 10 MG (21) PO TBPK: 10.0000 mg | ORAL_TABLET | Freq: Every day | ORAL | 0 refills | Status: DC

## 2016-11-15 MED ORDER — ONDANSETRON HCL 4 MG/2ML IJ SOLN
4.0000 mg | Freq: Once | INTRAMUSCULAR | Status: AC
  Administered 2016-11-15: 4 mg via INTRAVENOUS
  Filled 2016-11-15: qty 2

## 2016-11-15 MED ORDER — MORPHINE SULFATE (PF) 4 MG/ML IV SOLN
4.0000 mg | Freq: Once | INTRAVENOUS | Status: AC
  Administered 2016-11-15: 4 mg via INTRAVENOUS
  Filled 2016-11-15: qty 1

## 2016-11-15 MED ORDER — HYDROMORPHONE HCL 1 MG/ML IJ SOLN
0.5000 mg | Freq: Once | INTRAMUSCULAR | Status: AC
  Administered 2016-11-15: 0.5 mg via INTRAVENOUS
  Filled 2016-11-15: qty 1

## 2016-11-15 MED ORDER — OXYCODONE HCL 5 MG PO TABS
5.0000 mg | ORAL_TABLET | Freq: Once | ORAL | Status: AC
  Administered 2016-11-15: 5 mg via ORAL
  Filled 2016-11-15: qty 1

## 2016-11-15 MED ORDER — METHYLPREDNISOLONE SODIUM SUCC 125 MG IJ SOLR
125.0000 mg | Freq: Once | INTRAMUSCULAR | Status: AC
  Administered 2016-11-15: 125 mg via INTRAVENOUS
  Filled 2016-11-15: qty 2

## 2016-11-15 MED ORDER — OXYCODONE-ACETAMINOPHEN 7.5-325 MG PO TABS: 1.0000 | ORAL_TABLET | ORAL | 0 refills | Status: DC | PRN

## 2016-11-15 NOTE — ED Triage Notes (Signed)
Pt comes from home tonight c/o bilateral flank pain.  Pt. Says he has a hx of chronic back pain but this pain is nothing like he has felt before.  He is calm and co-operative during triage.  Pt has brought his meds which are in the room.  MD is present during triage, and patient VS are WNL.

## 2016-11-15 NOTE — ED Notes (Signed)
Pt taken to CT via stretcher.

## 2016-11-15 NOTE — ED Provider Notes (Signed)
[facility] Emergency Department Provider Note        Time seen: ----------------------------------------- 8:38 PM on 11/15/2016 -----------------------------------------    I have reviewed the triage vital signs and the nursing notes.   HISTORY  Chief Complaint Flank Pain    HPI Corey West is a 66 y.o. male who presents to the ER for bilateral low back pain and flank pain that radiates into the front. Patient states that sharp, nothing makes it better or worse. He has not had a history as before. He denies fevers, chills, chest pain but has had vomiting. He denies diarrhea. Patient has been dealing with colonic polyps and bleeding with subsequent anemia.   Past Medical History:  Diagnosis Date  . Anemia   . Anxiety   . Arthritis   . Cirrhosis ([place])   . Depression   . Diabetes mellitus without complication ([place])   . GERD (gastroesophageal reflux disease)   . History of hiatal hernia   . Sleep apnea     Patient Active Problem List   Diagnosis Date Noted  . GI bleed 07/20/2015  . Hematemesis 02/08/2015  . Cirrhosis ([place]) 02/08/2015  . Esophageal varices ([place]) 02/08/2015  . Type 2 diabetes mellitus ([place]) 02/08/2015  . Gastroesophageal reflux disease 02/08/2015  . Hyperlipidemia 02/08/2015  . Anemia 12/04/2013  . Thrombocytopenia ([place]) 12/04/2013  . Splenomegaly 11/04/2013    Past Surgical History:  Procedure Laterality Date  . ESOPHAGOGASTRODUODENOSCOPY N/A 02/09/2015   Procedure: ESOPHAGOGASTRODUODENOSCOPY (EGD);  Surgeon: Manya Silvas, MD;  Location: [facility] ENDOSCOPY;  Service: Endoscopy;  Laterality: N/A;  . ESOPHAGOGASTRODUODENOSCOPY N/A 07/22/2015   Procedure: ESOPHAGOGASTRODUODENOSCOPY (EGD);  Surgeon: Manya Silvas, MD;  Location: [facility] ENDOSCOPY;  Service: Endoscopy;  Laterality: N/A;  . ESOPHAGOGASTRODUODENOSCOPY (EGD) WITH PROPOFOL N/A 07/20/2015   Procedure: ESOPHAGOGASTRODUODENOSCOPY (EGD) WITH PROPOFOL;  Surgeon: Manya Silvas, MD;  Location: [facility] ENDOSCOPY;  Service: Endoscopy;  Laterality: N/A;  . ESOPHAGOGASTRODUODENOSCOPY (EGD) WITH PROPOFOL N/A 09/16/2015   Procedure: ESOPHAGOGASTRODUODENOSCOPY (EGD) WITH PROPOFOL;  Surgeon: Manya Silvas, MD;  Location: [facility] ENDOSCOPY;  Service: Endoscopy;  Laterality: N/A;  . ESOPHAGOGASTRODUODENOSCOPY (EGD) WITH PROPOFOL N/A 03/16/2016   Procedure: ESOPHAGOGASTRODUODENOSCOPY (EGD) WITH PROPOFOL;  Surgeon: Manya Silvas, MD;  Location: [facility] ENDOSCOPY;  Service: Endoscopy;  Laterality: N/A;  . ESOPHAGOGASTRODUODENOSCOPY (EGD) WITH PROPOFOL N/A 09/14/2016   Procedure: ESOPHAGOGASTRODUODENOSCOPY (EGD) WITH PROPOFOL;  Surgeon: Manya Silvas, MD;  Location: [facility] ENDOSCOPY;  Service: Endoscopy;  Laterality: N/A;  . ESOPHAGOGASTRODUODENOSCOPY (EGD) WITH PROPOFOL N/A 11/05/2016   Procedure: ESOPHAGOGASTRODUODENOSCOPY (EGD) WITH PROPOFOL;  Surgeon: Manya Silvas, MD;  Location: [facility] ENDOSCOPY;  Service: Endoscopy;  Laterality: N/A;  . TONSILLECTOMY    . TONSILLECTOMY AND ADENOIDECTOMY    . ULNAR NERVE TRANSPOSITION    . UVULOPALATOPHARYNGOPLASTY      Allergies Patient has no known allergies.  Social History Social History  Substance Use Topics  . Smoking status: Current Some Day Smoker  . Smokeless tobacco: Never Used  . Alcohol use No    Review of Systems Constitutional: Negative for fever. Cardiovascular: Negative for chest pain. Respiratory: Negative for shortness of breath. Gastrointestinal: Positive for flank pain, vomiting Genitourinary: Negative for dysuria. Musculoskeletal: Positive for low back pain Skin: Negative for rash. Neurological: Negative for headaches, focal weakness or numbness.  10-point ROS otherwise negative.  ____________________________________________   PHYSICAL EXAM:  VITAL SIGNS: ED Triage Vitals [11/15/16 2036]  Enc Vitals Group     BP      Pulse  Resp      Temp      Temp src      SpO2 99 %     Weight       Height      Head Circumference      Peak Flow      Pain Score      Pain Loc      Pain Edu?      Excl. in [place]?     Constitutional: Alert and oriented.Gen. debility, no distress Eyes: Conjunctivae are normal. PERRL. Normal extraocular movements. ENT   Head: Normocephalic and atraumatic.   Nose: No congestion/rhinnorhea.   Mouth/Throat: Mucous membranes are moist.   Neck: No stridor. Cardiovascular: Normal rate, regular rhythm. Murmur noted Respiratory: Normal respiratory effort without tachypnea nor retractions. Breath sounds are clear and equal bilaterally. No wheezes/rales/rhonchi. Gastrointestinal: Bilateral flank tenderness, no rebound or guarding. Normal bowel sounds. Musculoskeletal: Nontender with normal range of motion in all extremities. Bilateral lower extremity edema Neurologic:  Normal speech and language. No gross focal neurologic deficits are appreciated.  Skin:  Skin is warm, dry and intact. No rash noted. Psychiatric: Mood and affect are normal. Speech and behavior are normal.  ____________________________________________  ED COURSE:  Pertinent labs & imaging results that were available during my care of the patient were reviewed by me and considered in my medical decision making (see chart for details). Patient presents to ER with flank pain of uncertain etiology. We will assess with labs and imaging.   Procedures ____________________________________________   LABS (pertinent positives/negatives)  Labs Reviewed  CBC WITH DIFFERENTIAL/PLATELET - Abnormal; Notable for the following:       Result Value   RBC 2.61 (*)    Hemoglobin 8.6 (*)    HCT 24.0 (*)    RDW 14.7 (*)    Platelets 139 (*)    Lymphs Abs 0.5 (*)    All other components within normal limits  COMPREHENSIVE METABOLIC PANEL - Abnormal; Notable for the following:    Sodium 133 (*)    Chloride 97 (*)    Glucose, Bld 101 (*)    Creatinine, Ser 2.10 (*)    Albumin 3.1 (*)    AST 51  (*)    GFR calc non Af Amer 32 (*)    GFR calc Af Amer 37 (*)    All other components within normal limits  URINALYSIS, COMPLETE (UACMP) WITH MICROSCOPIC - Abnormal; Notable for the following:    Color, Urine YELLOW (*)    APPearance HAZY (*)    Squamous Epithelial / LPF 0-5 (*)    All other components within normal limits  PROTIME-INR - Abnormal; Notable for the following:    Prothrombin Time 15.7 (*)    All other components within normal limits  LIPASE, BLOOD    RADIOLOGY Images were viewed by me  CT renal protocol IMPRESSION:  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 Cholelithiasis without evidence of cholecystitis.  Small right inguinal hernia containing fluid. Small periumbilical  hernias containing fat. Possible fat necrosis.      ____________________________________________  FINAL ASSESSMENT AND PLAN  Flank pain, chronic kidney disease, arthritis  Plan: Patient with labs and imaging as dictated above. Patient is in no distress, I will discharge him on a steroid taper and pain medicine for presumed osteoarthritis or degenerative disc disease. He is stable for outpatient follow-up.   Earleen Newport, MD  Note: This note was generated in part or whole with voice recognition software. Voice recognition is usually quite accurate but there are transcription errors that can and very often do occur. I apologize for any typographical errors that were not detected and corrected.     Earleen Newport, MD 11/15/16 [id number]

## 2016-11-16 NOTE — ED Notes (Signed)
Pt requesting EMS ride, pt ambulatory and lives independently. Made aware of lack of need of EMS transportation, pt persistent about request.

## 2017-02-05 ENCOUNTER — Encounter: Payer: Self-pay | Admitting: *Deleted

## 2017-02-06 ENCOUNTER — Encounter
Admission: RE | Disposition: A | Discharge status: Home / Self Care | Payer: Self-pay | Source: Ambulatory Visit | Attending: Unknown Physician Specialty

## 2017-02-06 ENCOUNTER — Encounter: Payer: Self-pay | Admitting: *Deleted

## 2017-02-06 ENCOUNTER — Ambulatory Visit
Admission: RE | Admit: 2017-02-06 | Discharge: 2017-02-06 | Disposition: A | Discharge status: Home / Self Care | Payer: Medicare Other | Source: Ambulatory Visit | Attending: Unknown Physician Specialty | Admitting: Unknown Physician Specialty

## 2017-02-06 ENCOUNTER — Ambulatory Visit: Payer: Medicare Other | Admitting: Anesthesiology

## 2017-02-06 DIAGNOSIS — Z6841 Body Mass Index (BMI) 40.0 and over, adult: Secondary | ICD-10-CM | POA: Diagnosis not present

## 2017-02-06 DIAGNOSIS — K922 Gastrointestinal hemorrhage, unspecified: Secondary | ICD-10-CM | POA: Diagnosis present

## 2017-02-06 DIAGNOSIS — Z87891 Personal history of nicotine dependence: Secondary | ICD-10-CM | POA: Diagnosis not present

## 2017-02-06 DIAGNOSIS — K219 Gastro-esophageal reflux disease without esophagitis: Secondary | ICD-10-CM | POA: Diagnosis not present

## 2017-02-06 DIAGNOSIS — K317 Polyp of stomach and duodenum: Secondary | ICD-10-CM | POA: Insufficient documentation

## 2017-02-06 DIAGNOSIS — Z794 Long term (current) use of insulin: Secondary | ICD-10-CM | POA: Diagnosis not present

## 2017-02-06 DIAGNOSIS — F418 Other specified anxiety disorders: Secondary | ICD-10-CM | POA: Diagnosis not present

## 2017-02-06 DIAGNOSIS — D500 Iron deficiency anemia secondary to blood loss (chronic): Secondary | ICD-10-CM | POA: Insufficient documentation

## 2017-02-06 DIAGNOSIS — Z79899 Other long term (current) drug therapy: Secondary | ICD-10-CM | POA: Diagnosis not present

## 2017-02-06 DIAGNOSIS — E119 Type 2 diabetes mellitus without complications: Secondary | ICD-10-CM | POA: Insufficient documentation

## 2017-02-06 DIAGNOSIS — F419 Anxiety disorder, unspecified: Secondary | ICD-10-CM | POA: Diagnosis not present

## 2017-02-06 DIAGNOSIS — I10 Essential (primary) hypertension: Secondary | ICD-10-CM | POA: Insufficient documentation

## 2017-02-06 HISTORY — DX: Barrett's esophagus without dysplasia: K22.70

## 2017-02-06 HISTORY — DX: Essential (primary) hypertension: I10

## 2017-02-06 HISTORY — PX: ESOPHAGOGASTRODUODENOSCOPY (EGD) WITH PROPOFOL: SHX5813

## 2017-02-06 LAB — CBC WITH DIFFERENTIAL/PLATELET
Basophils Absolute: 0 10*3/uL (ref 0–0.1)
Basophils Relative: 1 %
Eosinophils Absolute: 0.2 10*3/uL (ref 0–0.7)
Eosinophils Relative: 3 %
HEMATOCRIT: 27 % — AB (ref 40.0–52.0)
HEMOGLOBIN: 9.7 g/dL — AB (ref 13.0–18.0)
LYMPHS PCT: 16 %
Lymphs Abs: 0.8 10*3/uL — ABNORMAL LOW (ref 1.0–3.6)
MCH: 32.6 pg (ref 26.0–34.0)
MCHC: 35.7 g/dL (ref 32.0–36.0)
MCV: 91.2 fL (ref 80.0–100.0)
MONOS PCT: 9 %
Monocytes Absolute: 0.5 10*3/uL (ref 0.2–1.0)
NEUTROS ABS: 3.7 10*3/uL (ref 1.4–6.5)
NEUTROS PCT: 71 %
Platelets: 91 10*3/uL — ABNORMAL LOW (ref 150–440)
RBC: 2.96 MIL/uL — ABNORMAL LOW (ref 4.40–5.90)
RDW: 15.4 % — ABNORMAL HIGH (ref 11.5–14.5)
WBC: 5.2 10*3/uL (ref 3.8–10.6)

## 2017-02-06 LAB — PROTIME-INR
INR: 1.21
Prothrombin Time: 15.4 seconds — ABNORMAL HIGH (ref 11.4–15.2)

## 2017-02-06 LAB — GLUCOSE, CAPILLARY: Glucose-Capillary: 181 mg/dL — ABNORMAL HIGH (ref 65–99)

## 2017-02-06 SURGERY — ESOPHAGOGASTRODUODENOSCOPY (EGD) WITH PROPOFOL
Anesthesia: General

## 2017-02-06 MED ORDER — FENTANYL CITRATE (PF) 100 MCG/2ML IJ SOLN
INTRAMUSCULAR | Status: DC | PRN
  Administered 2017-02-06 (×2): 50 ug via INTRAVENOUS

## 2017-02-06 MED ORDER — PROPOFOL 500 MG/50ML IV EMUL
INTRAVENOUS | Status: DC | PRN
  Administered 2017-02-06: 120 ug/kg/min via INTRAVENOUS

## 2017-02-06 MED ORDER — GLYCOPYRROLATE 0.2 MG/ML IJ SOLN
INTRAMUSCULAR | Status: AC
  Filled 2017-02-06: qty 1

## 2017-02-06 MED ORDER — MIDAZOLAM HCL 2 MG/2ML IJ SOLN
INTRAMUSCULAR | Status: DC | PRN
  Administered 2017-02-06: 2 mg via INTRAVENOUS

## 2017-02-06 MED ORDER — SODIUM CHLORIDE 0.9 % IV SOLN: INTRAVENOUS | Status: DC

## 2017-02-06 MED ORDER — PROPOFOL 500 MG/50ML IV EMUL
INTRAVENOUS | Status: AC
  Filled 2017-02-06: qty 50

## 2017-02-06 MED ORDER — LIDOCAINE HCL 2 % IJ SOLN
INTRAMUSCULAR | Status: AC
  Filled 2017-02-06: qty 10

## 2017-02-06 MED ORDER — FENTANYL CITRATE (PF) 100 MCG/2ML IJ SOLN
INTRAMUSCULAR | Status: AC
  Filled 2017-02-06: qty 2

## 2017-02-06 MED ORDER — BUTAMBEN-TETRACAINE-BENZOCAINE 2-2-14 % EX AERO
INHALATION_SPRAY | CUTANEOUS | Status: AC
  Filled 2017-02-06: qty 20

## 2017-02-06 MED ORDER — LIDOCAINE HCL (CARDIAC) 20 MG/ML IV SOLN
INTRAVENOUS | Status: DC | PRN
  Administered 2017-02-06: 30 mg via INTRAVENOUS

## 2017-02-06 MED ORDER — MIDAZOLAM HCL 2 MG/2ML IJ SOLN
INTRAMUSCULAR | Status: AC
  Filled 2017-02-06: qty 2

## 2017-02-06 MED ORDER — SODIUM CHLORIDE 0.9 % IV SOLN
INTRAVENOUS | Status: DC
  Administered 2017-02-06: 1000 mL via INTRAVENOUS

## 2017-02-06 NOTE — H&P (Signed)
Primary Care Physician:  Madelyn Brunner, MD Primary Gastroenterologist:  Dr. Vira Agar  Pre-Procedure History & Physical: HPI:  Corey West is a 66 y.o. male is here for an endoscopy.   Past Medical History:  Diagnosis Date  . Anemia   . Anxiety   . Arthritis   . Barrett's esophagus   . Cirrhosis ([place])   . Depression   . Diabetes mellitus without complication ([place])   . GERD (gastroesophageal reflux disease)   . History of hiatal hernia   . Hypertension   . Sleep apnea     Past Surgical History:  Procedure Laterality Date  . ESOPHAGOGASTRODUODENOSCOPY N/A 02/09/2015   Procedure: ESOPHAGOGASTRODUODENOSCOPY (EGD);  Surgeon: Manya Silvas, MD;  Location: [facility] ENDOSCOPY;  Service: Endoscopy;  Laterality: N/A;  . ESOPHAGOGASTRODUODENOSCOPY N/A 07/22/2015   Procedure: ESOPHAGOGASTRODUODENOSCOPY (EGD);  Surgeon: Manya Silvas, MD;  Location: [facility] ENDOSCOPY;  Service: Endoscopy;  Laterality: N/A;  . ESOPHAGOGASTRODUODENOSCOPY (EGD) WITH PROPOFOL N/A 07/20/2015   Procedure: ESOPHAGOGASTRODUODENOSCOPY (EGD) WITH PROPOFOL;  Surgeon: Manya Silvas, MD;  Location: [facility] ENDOSCOPY;  Service: Endoscopy;  Laterality: N/A;  . ESOPHAGOGASTRODUODENOSCOPY (EGD) WITH PROPOFOL N/A 09/16/2015   Procedure: ESOPHAGOGASTRODUODENOSCOPY (EGD) WITH PROPOFOL;  Surgeon: Manya Silvas, MD;  Location: [facility] ENDOSCOPY;  Service: Endoscopy;  Laterality: N/A;  . ESOPHAGOGASTRODUODENOSCOPY (EGD) WITH PROPOFOL N/A 03/16/2016   Procedure: ESOPHAGOGASTRODUODENOSCOPY (EGD) WITH PROPOFOL;  Surgeon: Manya Silvas, MD;  Location[facility] ENDOSCOPY;  Service: Endoscopy;  Laterality: N/A;  . ESOPHAGOGASTRODUODENOSCOPY (EGD) WITH PROPOFOL N/A 09/14/2016   Procedure: ESOPHAGOGASTRODUODENOSCOPY (EGD) WITH PROPOFOL;  Surgeon: Manya Silvas, MD;  Location: [facility] ENDOSCOPY;  Service: Endoscopy;  Laterality: N/A;  . ESOPHAGOGASTRODUODENOSCOPY (EGD) WITH PROPOFOL N/A 11/05/2016   Procedure: ESOPHAGOGASTRODUODENOSCOPY  (EGD) WITH PROPOFOL;  Surgeon: Manya Silvas, MD;  Location: [facility] ENDOSCOPY;  Service: Endoscopy;  Laterality: N/A;  . TONSILLECTOMY    . TONSILLECTOMY AND ADENOIDECTOMY    . ULNAR NERVE TRANSPOSITION    . UVULOPALATOPHARYNGOPLASTY      Prior to Admission medications   Medication Sig Start Date End Date Taking? Authorizing Provider  ammonium lactate (AMLACTIN) 12 % cream Apply topically 2 (two) times daily.   Yes [provider]  nadolol (CORGARD) 40 MG tablet Take 40 mg by mouth daily.    Yes [provider]  Ferrous Sulfate 140 (45 FE) MG TBCR Take 1 tablet by mouth daily.     [provider]  furosemide (LASIX) 40 MG tablet Take 40 mg by mouth 2 (two) times daily.    [provider]  insulin aspart protamine- aspart (NOVOLOG MIX 70/30) (70-30) 100 UNIT/ML injection Inject 62-84 Units into the skin 2 (two) times daily with a meal. Pt uses 84 units with breakfast and 62 units with supper.    [provider]  lactulose (CHRONULAC) 10 GM/15ML solution Take 20 g by mouth 2 (two) times daily.    [provider]  LORazepam (ATIVAN) 0.5 MG tablet Take 0.5 mg by mouth every 8 (eight) hours as needed for anxiety.    [provider]  metolazone (ZAROXOLYN) 2.5 MG tablet Take 2.5 mg by mouth daily.     [provider]  omeprazole (PRILOSEC) 40 MG capsule Take 40 mg by mouth 2 (two) times daily.    [provider]  oxyCODONE (ROXICODONE) 5 MG immediate release tablet Take 1 tablet (5 mg total) by mouth every 8 (eight) hours as needed. 11/15/16 11/15/17  Earleen Newport, MD  oxyCODONE-acetaminophen (PERCOCET)  7.5-325 MG tablet Take 1 tablet by mouth every 4 (four) hours as needed for severe pain. 11/15/16 11/15/17  Earleen Newport, MD  rifaximin (XIFAXAN) 550 MG TABS tablet Take 550 mg by mouth 2 (two) times daily.    [provider]  spironolactone (ALDACTONE) 25 MG tablet Take 25 mg by mouth daily.     [provider]  traMADol (ULTRAM) 50 MG tablet Take by mouth every 6 (six) hours as needed.     [provider]    Allergies as of 12/10/2016  . (No Known Allergies)    Family History  Problem Relation Age of Onset  . Diabetes Other     Social History   Social History  . Marital status: Single    Spouse name: N/A  . Number of children: N/A  . Years of education: N/A   Occupational History  . Not on file.   Social History Main Topics  . Smoking status: Former Smoker    Quit date: 01/14/2017  . Smokeless tobacco: Never Used  . Alcohol use Yes     Comment: stopped 12 years ago  . Drug use: No  . Sexual activity: Not on file   Other Topics Concern  . Not on file   Social History Narrative  . No narrative on file    Review of Systems: See HPI, otherwise negative ROS  Physical Exam: BP (!) 131/53   Pulse 61   Temp 97.8 F (36.6 C) (Tympanic)   Resp 18   Ht 5\' 10"  (1.778 m)   Wt 127 kg (280 lb)   SpO2 100%   BMI 40.18 kg/m  General:   Alert,  pleasant and cooperative in NAD Head:  Normocephalic and atraumatic. Neck:  Supple; no masses or thyromegaly. Lungs:  Clear throughout to auscultation.    Heart:  Regular rate and rhythm. Abdomen:  Soft, nontender and nondistended. Normal bowel sounds, without guarding, and without rebound.   Neurologic:  Alert and  oriented x4;  grossly normal neurologically.  Impression/Plan: [name] is here for an endoscopy to be performed for follow up of bleeding gastric polyps and AVM of stomach both contributing to iron def anemia.  Risks, benefits, limitations, and alternatives regarding  endoscopy have been reviewed with the patient.  Questions have been answered.  All parties agreeable.   [name], MD  02/06/2017, 8:45 AM

## 2017-02-06 NOTE — Anesthesia Postprocedure Evaluation (Signed)
Anesthesia Post Note  Patient: Corey West  Procedure(s) Performed: Procedure(s) (LRB): ESOPHAGOGASTRODUODENOSCOPY (EGD) WITH PROPOFOL (N/A)  Patient location during evaluation: PACU Anesthesia Type: General Level of consciousness: awake Pain management: pain level controlled Vital Signs Assessment: post-procedure vital signs reviewed and stable Respiratory status: spontaneous breathing Cardiovascular status: stable Anesthetic complications: no     Last Vitals:  Vitals:   02/06/17 0747 02/06/17 0924  BP: (!) 131/53 137/60  Pulse: 61 85  Resp: 18 13  Temp: 36.6 C (!) 35.8 C    Last Pain:  Vitals:   02/06/17 0924  TempSrc: Tympanic                 VAN STAVEREN,Arya Boxley

## 2017-02-06 NOTE — Anesthesia Post-op Follow-up Note (Cosign Needed)
Anesthesia QCDR form completed.        

## 2017-02-06 NOTE — Anesthesia Preprocedure Evaluation (Signed)
Anesthesia Evaluation  Patient identified by MRN, date of birth, ID band Patient awake    Reviewed: Allergy & Precautions, NPO status , Patient's Chart, lab work & pertinent test results  Airway Mallampati: III       Dental  (+) Teeth Intact   Pulmonary sleep apnea , former smoker,     + decreased breath sounds      Cardiovascular hypertension, Pt. on home beta blockers  Rhythm:Regular     Neuro/Psych Anxiety Depression    GI/Hepatic hiatal hernia, GERD  ,  Endo/Other  diabetes, Type 1, Insulin DependentMorbid obesity  Renal/GU      Musculoskeletal   Abdominal (+) + obese,   Peds negative pediatric ROS (+)  Hematology  (+) anemia ,   Anesthesia Other Findings   Reproductive/Obstetrics                             Anesthesia Physical Anesthesia Plan  ASA: III  Anesthesia Plan: General   Post-op Pain Management:    Induction: Intravenous  Airway Management Planned: Natural Airway and Nasal Cannula  Additional Equipment:   Intra-op Plan:   Post-operative Plan:   Informed Consent: I have reviewed the patients History and Physical, chart, labs and discussed the procedure including the risks, benefits and alternatives for the proposed anesthesia with the patient or authorized representative who has indicated his/her understanding and acceptance.     Plan Discussed with: CRNA  Anesthesia Plan Comments:         Anesthesia Quick Evaluation

## 2017-02-06 NOTE — Op Note (Signed)
[facility] Gastroenterology Patient Name: Corey West Procedure Date: 02/06/2017 8:45 AM MRN: [id number] Account #: [id number] Date of Birth: 11-Oct-1950 Admit Type: Outpatient Age: 66 Room: [facility] ENDO ROOM 3 Gender: Male Note Status: Finalized Procedure:            Upper GI endoscopy Indications:          Recent gastrointestinal bleeding, Suspected upper                        gastrointestinal bleeding, Suspected upper                        gastrointestinal bleeding in patient with chronic blood                        loss Providers:            Manya Silvas, MD Referring MD:         Hewitt Blade. Sarina Ser, MD (Referring MD) Medicines:            Propofol per Anesthesia Complications:        No immediate complications. Procedure:            Pre-Anesthesia Assessment:                       - After reviewing the risks and benefits, the patient                        was deemed in satisfactory condition to undergo the                        procedure.                       After obtaining informed consent, the endoscope was                        passed under direct vision. Throughout the procedure,                        the patient's blood pressure, pulse, and oxygen                        saturations were monitored continuously. The Endoscope                        was introduced through the mouth, and advanced to the                        duodenal bulb. The upper GI endoscopy was accomplished                        without difficulty. The patient tolerated the procedure                        well. Findings:      The examined esophagus was normal.      Multiple medium sessile polyps with bleeding and stigmata of recent       bleeding were found in the gastric fundus, in the gastric body and in       the gastric antrum. The polyp was  removed with a hot snare and treated       with argon at stomach settings.. Resection and retrieval were complete       using a  Roth net. Two hemostatic clips were successfully placed.      Antrum with polyps but not as bad as previously seen. Treated with argon       at stomach settings. No bleeding seen after treatment. Scattered flat       erythematous spots in the antrum. Impression:           - Normal esophagus.                       - Multiple gastric polyps. Resected and retrieved.                        Clips were placed. Recommendation:       Very soft diet for 3 days, avoid all NSAID like motrin,                        advil, aleve. Get blood count checked in 2-4 weeks.                       - The findings and recommendations were discussed with                        the patient's family. Manya Silvas, MD 02/06/2017 9:27:37 AM This report has been signed electronically. Number of Addenda: 0 Note Initiated On: 02/06/2017 8:45 AM      [facility]

## 2017-02-06 NOTE — Transfer of Care (Signed)
Immediate Anesthesia Transfer of Care Note  Patient: [name]  Procedure(s) Performed: Procedure(s): ESOPHAGOGASTRODUODENOSCOPY (EGD) WITH PROPOFOL (N/A)  Patient Location: PACU  Anesthesia Type:General  Level of Consciousness: awake and sedated  Airway & Oxygen Therapy: Patient Spontanous Breathing and Patient connected to nasal cannula oxygen  Post-op Assessment: Report given to RN and Post -op Vital signs reviewed and stable  Post vital signs: Reviewed and stable  Last Vitals:  Vitals:   02/06/17 0747  BP: (!) 131/53  Pulse: 61  Resp: 18  Temp: 36.6 C    Last Pain:  Vitals:   02/06/17 0747  TempSrc: Tympanic         Complications: No apparent anesthesia complications

## 2017-02-06 NOTE — Anesthesia Procedure Notes (Signed)
Performed by: Vaughan Sine Pre-anesthesia Checklist: Patient identified, Suction available, Emergency Drugs available, Patient being monitored and Timeout performed Patient Re-evaluated:Patient Re-evaluated prior to inductionOxygen Delivery Method: Nasal cannula Preoxygenation: Pre-oxygenation with 100% oxygen Intubation Type: IV induction Airway Equipment and Method: Bite block Placement Confirmation: CO2 detector and positive ETCO2

## 2017-02-07 LAB — SURGICAL PATHOLOGY

## 2017-02-08 ENCOUNTER — Encounter: Payer: Self-pay | Admitting: Unknown Physician Specialty

## 2017-02-21 ENCOUNTER — Emergency Department: Payer: Medicare Other

## 2017-02-21 ENCOUNTER — Inpatient Hospital Stay
Admission: EM | Admit: 2017-02-21 | Discharge: 2017-02-22 | DRG: 683 | Disposition: A | Discharge status: Home / Self Care | Payer: Medicare Other | Attending: Internal Medicine | Admitting: Internal Medicine

## 2017-02-21 ENCOUNTER — Encounter: Payer: Self-pay | Admitting: Emergency Medicine

## 2017-02-21 DIAGNOSIS — E1122 Type 2 diabetes mellitus with diabetic chronic kidney disease: Secondary | ICD-10-CM | POA: Diagnosis present

## 2017-02-21 DIAGNOSIS — L03116 Cellulitis of left lower limb: Secondary | ICD-10-CM | POA: Diagnosis not present

## 2017-02-21 DIAGNOSIS — I872 Venous insufficiency (chronic) (peripheral): Secondary | ICD-10-CM | POA: Diagnosis present

## 2017-02-21 DIAGNOSIS — Z833 Family history of diabetes mellitus: Secondary | ICD-10-CM | POA: Diagnosis not present

## 2017-02-21 DIAGNOSIS — K219 Gastro-esophageal reflux disease without esophagitis: Secondary | ICD-10-CM | POA: Diagnosis not present

## 2017-02-21 DIAGNOSIS — F329 Major depressive disorder, single episode, unspecified: Secondary | ICD-10-CM | POA: Diagnosis not present

## 2017-02-21 DIAGNOSIS — K7460 Unspecified cirrhosis of liver: Secondary | ICD-10-CM | POA: Diagnosis present

## 2017-02-21 DIAGNOSIS — E860 Dehydration: Secondary | ICD-10-CM | POA: Diagnosis present

## 2017-02-21 DIAGNOSIS — Z87891 Personal history of nicotine dependence: Secondary | ICD-10-CM | POA: Diagnosis not present

## 2017-02-21 DIAGNOSIS — N179 Acute kidney failure, unspecified: Principal | ICD-10-CM | POA: Diagnosis present

## 2017-02-21 DIAGNOSIS — Z79899 Other long term (current) drug therapy: Secondary | ICD-10-CM | POA: Diagnosis not present

## 2017-02-21 DIAGNOSIS — R262 Difficulty in walking, not elsewhere classified: Secondary | ICD-10-CM | POA: Diagnosis not present

## 2017-02-21 DIAGNOSIS — K429 Umbilical hernia without obstruction or gangrene: Secondary | ICD-10-CM | POA: Diagnosis present

## 2017-02-21 DIAGNOSIS — L03119 Cellulitis of unspecified part of limb: Secondary | ICD-10-CM

## 2017-02-21 DIAGNOSIS — I890 Lymphedema, not elsewhere classified: Secondary | ICD-10-CM | POA: Diagnosis present

## 2017-02-21 DIAGNOSIS — L0390 Cellulitis, unspecified: Secondary | ICD-10-CM | POA: Diagnosis present

## 2017-02-21 DIAGNOSIS — I129 Hypertensive chronic kidney disease with stage 1 through stage 4 chronic kidney disease, or unspecified chronic kidney disease: Secondary | ICD-10-CM | POA: Diagnosis not present

## 2017-02-21 DIAGNOSIS — Z794 Long term (current) use of insulin: Secondary | ICD-10-CM

## 2017-02-21 DIAGNOSIS — R531 Weakness: Secondary | ICD-10-CM

## 2017-02-21 DIAGNOSIS — F419 Anxiety disorder, unspecified: Secondary | ICD-10-CM | POA: Diagnosis not present

## 2017-02-21 DIAGNOSIS — E1165 Type 2 diabetes mellitus with hyperglycemia: Secondary | ICD-10-CM | POA: Diagnosis present

## 2017-02-21 DIAGNOSIS — K2270 Barrett's esophagus without dysplasia: Secondary | ICD-10-CM | POA: Diagnosis not present

## 2017-02-21 DIAGNOSIS — R739 Hyperglycemia, unspecified: Secondary | ICD-10-CM

## 2017-02-21 DIAGNOSIS — G4730 Sleep apnea, unspecified: Secondary | ICD-10-CM | POA: Diagnosis not present

## 2017-02-21 DIAGNOSIS — N189 Chronic kidney disease, unspecified: Secondary | ICD-10-CM | POA: Diagnosis not present

## 2017-02-21 DIAGNOSIS — E11628 Type 2 diabetes mellitus with other skin complications: Secondary | ICD-10-CM

## 2017-02-21 LAB — URINALYSIS, COMPLETE (UACMP) WITH MICROSCOPIC
BILIRUBIN URINE: NEGATIVE
Bacteria, UA: NONE SEEN
Glucose, UA: >=500 mg/dL — AB
HGB URINE DIPSTICK: NEGATIVE
KETONES UR: NEGATIVE mg/dL
LEUKOCYTES UA: NEGATIVE
NITRITE: NEGATIVE
PH: 6 (ref 5.0–8.0)
Protein, ur: 30 mg/dL — AB
Specific Gravity, Urine: 1.012 (ref 1.005–1.030)

## 2017-02-21 LAB — CBC WITH DIFFERENTIAL/PLATELET
BASOS PCT: 0 %
Basophils Absolute: 0 10*3/uL (ref 0–0.1)
Eosinophils Absolute: 0 10*3/uL (ref 0–0.7)
Eosinophils Relative: 0 %
HCT: 29.5 % — ABNORMAL LOW (ref 40.0–52.0)
Hemoglobin: 10.4 g/dL — ABNORMAL LOW (ref 13.0–18.0)
Lymphocytes Relative: 3 %
Lymphs Abs: 0.4 10*3/uL — ABNORMAL LOW (ref 1.0–3.6)
MCH: 32.9 pg (ref 26.0–34.0)
MCHC: 35.2 g/dL (ref 32.0–36.0)
MCV: 93.5 fL (ref 80.0–100.0)
MONO ABS: 0.5 10*3/uL (ref 0.2–1.0)
MONOS PCT: 4 %
Neutro Abs: 12.3 10*3/uL — ABNORMAL HIGH (ref 1.4–6.5)
Neutrophils Relative %: 93 %
PLATELETS: 123 10*3/uL — AB (ref 150–440)
RBC: 3.16 MIL/uL — AB (ref 4.40–5.90)
RDW: 14.3 % (ref 11.5–14.5)
WBC: 13.3 10*3/uL — ABNORMAL HIGH (ref 3.8–10.6)

## 2017-02-21 LAB — COMPREHENSIVE METABOLIC PANEL
ALBUMIN: 3 g/dL — AB (ref 3.5–5.0)
ALK PHOS: 70 U/L (ref 38–126)
ALT: 24 U/L (ref 17–63)
AST: 57 U/L — AB (ref 15–41)
Anion gap: 14 (ref 5–15)
BILIRUBIN TOTAL: 1.4 mg/dL — AB (ref 0.3–1.2)
BUN: 25 mg/dL — AB (ref 6–20)
CO2: 24 mmol/L (ref 22–32)
CREATININE: 2.16 mg/dL — AB (ref 0.61–1.24)
Calcium: 9.7 mg/dL (ref 8.9–10.3)
Chloride: 95 mmol/L — ABNORMAL LOW (ref 101–111)
GFR calc Af Amer: 35 mL/min — ABNORMAL LOW (ref >60)
GFR calc non Af Amer: 30 mL/min — ABNORMAL LOW (ref >60)
Glucose, Bld: 361 mg/dL — ABNORMAL HIGH (ref 65–99)
Potassium: 3.7 mmol/L (ref 3.5–5.1)
Sodium: 133 mmol/L — ABNORMAL LOW (ref 135–145)
Total Protein: 6.6 g/dL (ref 6.5–8.1)

## 2017-02-21 LAB — BLOOD GAS, VENOUS
Acid-Base Excess: 2.6 mmol/L — ABNORMAL HIGH (ref 0.0–2.0)
Bicarbonate: 26.2 mmol/L (ref 20.0–28.0)
O2 SAT: 87.6 %
PATIENT TEMPERATURE: 37
pCO2, Ven: 36 mmHg — ABNORMAL LOW (ref 44.0–60.0)
pH, Ven: 7.47 — ABNORMAL HIGH (ref 7.250–7.430)
pO2, Ven: 50 mmHg — ABNORMAL HIGH (ref 32.0–45.0)

## 2017-02-21 LAB — GLUCOSE, CAPILLARY
GLUCOSE-CAPILLARY: 366 mg/dL — AB (ref 65–99)
GLUCOSE-CAPILLARY: 284 mg/dL — AB (ref 65–99)
Glucose-Capillary: 349 mg/dL — ABNORMAL HIGH (ref 65–99)
Glucose-Capillary: 347 mg/dL — ABNORMAL HIGH (ref 65–99)
Glucose-Capillary: 309 mg/dL — ABNORMAL HIGH (ref 65–99)

## 2017-02-21 LAB — LIPASE, BLOOD: LIPASE: 23 U/L (ref 11–51)

## 2017-02-21 LAB — AMMONIA: AMMONIA: 66 umol/L — AB (ref 9–35)

## 2017-02-21 MED ORDER — INSULIN ASPART 100 UNIT/ML ~~LOC~~ SOLN
0.0000 [IU] | Freq: Three times a day (TID) | SUBCUTANEOUS | Status: DC
Start: 2017-02-22 — End: 2017-02-22
  Administered 2017-02-22: 5 [IU] via SUBCUTANEOUS
  Administered 2017-02-22: 7 [IU] via SUBCUTANEOUS
  Filled 2017-02-21: qty 7
  Filled 2017-02-21: qty 5

## 2017-02-21 MED ORDER — RIFAXIMIN 550 MG PO TABS
550.0000 mg | ORAL_TABLET | Freq: Two times a day (BID) | ORAL | Status: DC
  Administered 2017-02-21 – 2017-02-22 (×2): 550 mg via ORAL
  Filled 2017-02-21 (×2): qty 1

## 2017-02-21 MED ORDER — IOPAMIDOL (ISOVUE-300) INJECTION 61%
15.0000 mL | INTRAVENOUS | Status: AC
  Administered 2017-02-21: 30 mL via ORAL

## 2017-02-21 MED ORDER — LORAZEPAM 0.5 MG PO TABS: 0.5000 mg | ORAL_TABLET | Freq: Three times a day (TID) | ORAL | Status: DC | PRN

## 2017-02-21 MED ORDER — DOCUSATE SODIUM 100 MG PO CAPS: 100.0000 mg | ORAL_CAPSULE | Freq: Two times a day (BID) | ORAL | Status: DC | PRN

## 2017-02-21 MED ORDER — INSULIN ASPART 100 UNIT/ML ~~LOC~~ SOLN
10.0000 [IU] | Freq: Once | SUBCUTANEOUS | Status: AC
  Administered 2017-02-21: 10 [IU] via INTRAVENOUS
  Filled 2017-02-21: qty 10

## 2017-02-21 MED ORDER — PANTOPRAZOLE SODIUM 40 MG PO TBEC
40.0000 mg | DELAYED_RELEASE_TABLET | Freq: Every day | ORAL | Status: DC
  Administered 2017-02-21 – 2017-02-22 (×2): 40 mg via ORAL
  Filled 2017-02-21 (×2): qty 1

## 2017-02-21 MED ORDER — PIPERACILLIN-TAZOBACTAM 3.375 G IVPB
3.3750 g | Freq: Two times a day (BID) | INTRAVENOUS | Status: DC
  Administered 2017-02-22: 3.375 g via INTRAVENOUS
  Filled 2017-02-21 (×2): qty 50

## 2017-02-21 MED ORDER — TRAMADOL HCL 50 MG PO TABS: 50.0000 mg | ORAL_TABLET | Freq: Four times a day (QID) | ORAL | Status: DC | PRN

## 2017-02-21 MED ORDER — VANCOMYCIN HCL 10 G IV SOLR
2000.0000 mg | INTRAVENOUS | Status: AC
  Administered 2017-02-21: 23:00:00 2000 mg via INTRAVENOUS
  Filled 2017-02-21 (×2): qty 2000

## 2017-02-21 MED ORDER — INSULIN ASPART PROT & ASPART (70-30 MIX) 100 UNIT/ML ~~LOC~~ SUSP
50.0000 [IU] | Freq: Two times a day (BID) | SUBCUTANEOUS | Status: DC
  Administered 2017-02-22: 50 [IU] via SUBCUTANEOUS
  Filled 2017-02-21: qty 50

## 2017-02-21 MED ORDER — LACTULOSE 10 GM/15ML PO SOLN
40.0000 g | Freq: Every day | ORAL | Status: DC
  Administered 2017-02-21: 22:00:00 40 g via ORAL
  Filled 2017-02-21: qty 60

## 2017-02-21 MED ORDER — FUROSEMIDE 40 MG PO TABS
40.0000 mg | ORAL_TABLET | Freq: Two times a day (BID) | ORAL | Status: DC
  Administered 2017-02-22: 40 mg via ORAL
  Filled 2017-02-21: qty 1

## 2017-02-21 MED ORDER — SPIRONOLACTONE 25 MG PO TABS
25.0000 mg | ORAL_TABLET | Freq: Every day | ORAL | Status: DC
  Administered 2017-02-21 – 2017-02-22 (×2): 25 mg via ORAL
  Filled 2017-02-21 (×2): qty 1

## 2017-02-21 MED ORDER — SODIUM CHLORIDE 0.9 % IV BOLUS (SEPSIS)
2000.0000 mL | Freq: Once | INTRAVENOUS | Status: AC
  Administered 2017-02-21: 2000 mL via INTRAVENOUS

## 2017-02-21 MED ORDER — PIPERACILLIN-TAZOBACTAM 3.375 G IVPB 30 MIN: 3.3750 g | INTRAVENOUS | Status: DC

## 2017-02-21 MED ORDER — HEPARIN SODIUM (PORCINE) 5000 UNIT/ML IJ SOLN
5000.0000 [IU] | Freq: Three times a day (TID) | INTRAMUSCULAR | Status: DC
  Administered 2017-02-21 – 2017-02-22 (×2): 5000 [IU] via SUBCUTANEOUS
  Filled 2017-02-21 (×2): qty 1

## 2017-02-21 NOTE — H&P (Signed)
[facility] NAME: Corey West    MR#:  [id number]  DATE OF BIRTH:  1951/04/05  DATE OF ADMISSION:  02/21/2017  PRIMARY CARE PHYSICIAN: Madelyn Brunner, MD   REQUESTING/REFERRING PHYSICIAN: stafford  CHIEF COMPLAINT:   Chief Complaint  Patient presents with  . Fall    HISTORY OF PRESENT ILLNESS: Corey West  is a 66 y.o. male with a known history of Anemia, anxiety, cirrhosis of the liver, diabetes, hypertension, sleep apnea- for last few days is feeling increasingly weak generalized and have difficulty in walking. He called EMS today because of this and they checked his blood sugar and it was high so advised to come to emergency room. In ER his initial infection workups were negative including urinalysis and they noted his CT scan of the head was negative for any acute findings. On further questioning he denies fever or chills. He denies focal weakness.  PAST MEDICAL HISTORY:   Past Medical History:  Diagnosis Date  . Anemia   . Anxiety   . Arthritis   . Barrett's esophagus   . Cirrhosis ([place])   . Depression   . Diabetes mellitus without complication ([place])   . GERD (gastroesophageal reflux disease)   . History of hiatal hernia   . Hypertension   . Sleep apnea     PAST SURGICAL HISTORY: Past Surgical History:  Procedure Laterality Date  . ESOPHAGOGASTRODUODENOSCOPY N/A 02/09/2015   Procedure: ESOPHAGOGASTRODUODENOSCOPY (EGD);  Surgeon: Manya Silvas, MD;  Location: [facility] ENDOSCOPY;  Service: Endoscopy;  Laterality: N/A;  . ESOPHAGOGASTRODUODENOSCOPY N/A 07/22/2015   Procedure: ESOPHAGOGASTRODUODENOSCOPY (EGD);  Surgeon: Manya Silvas, MD;  Location: [facility] ENDOSCOPY;  Service: Endoscopy;  Laterality: N/A;  . ESOPHAGOGASTRODUODENOSCOPY (EGD) WITH PROPOFOL N/A 07/20/2015   Procedure: ESOPHAGOGASTRODUODENOSCOPY (EGD) WITH PROPOFOL;  Surgeon: Manya Silvas, MD;  Location: [facility] ENDOSCOPY;  Service: Endoscopy;  Laterality:  N/A;  . ESOPHAGOGASTRODUODENOSCOPY (EGD) WITH PROPOFOL N/A 09/16/2015   Procedure: ESOPHAGOGASTRODUODENOSCOPY (EGD) WITH PROPOFOL;  Surgeon: Manya Silvas, MD;  Location: [facility] ENDOSCOPY;  Service: Endoscopy;  Laterality: N/A;  . ESOPHAGOGASTRODUODENOSCOPY (EGD) WITH PROPOFOL N/A 03/16/2016   Procedure: ESOPHAGOGASTRODUODENOSCOPY (EGD) WITH PROPOFOL;  Surgeon: Manya Silvas, MD;  Location: [facility] ENDOSCOPY;  Service: Endoscopy;  Laterality: N/A;  . ESOPHAGOGASTRODUODENOSCOPY (EGD) WITH PROPOFOL N/A 09/14/2016   Procedure: ESOPHAGOGASTRODUODENOSCOPY (EGD) WITH PROPOFOL;  Surgeon: Manya Silvas, MD;  Location: [facility] ENDOSCOPY;  Service: Endoscopy;  Laterality: N/A;  . ESOPHAGOGASTRODUODENOSCOPY (EGD) WITH PROPOFOL N/A 11/05/2016   Procedure: ESOPHAGOGASTRODUODENOSCOPY (EGD) WITH PROPOFOL;  Surgeon: Manya Silvas, MD;  Location: [facility] ENDOSCOPY;  Service: Endoscopy;  Laterality: N/A;  . ESOPHAGOGASTRODUODENOSCOPY (EGD) WITH PROPOFOL N/A 02/06/2017   Procedure: ESOPHAGOGASTRODUODENOSCOPY (EGD) WITH PROPOFOL;  Surgeon: Manya Silvas, MD;  Location: [facility] ENDOSCOPY;  Service: Endoscopy;  Laterality: N/A;  . TONSILLECTOMY    . TONSILLECTOMY AND ADENOIDECTOMY    . ULNAR NERVE TRANSPOSITION    . UVULOPALATOPHARYNGOPLASTY      SOCIAL HISTORY:  Social History  Substance Use Topics  . Smoking status: Former Smoker    Quit date: 01/14/2017  . Smokeless tobacco: Never Used  . Alcohol use Yes     Comment: stopped 12 years ago    FAMILY HISTORY:  Family History  Problem Relation Age of Onset  . Diabetes Other     DRUG ALLERGIES: No Known Allergies  REVIEW OF SYSTEMS:   CONSTITUTIONAL: No fever,Positive for fatigue or weakness.  EYES: No blurred or double  vision.  EARS, NOSE, AND THROAT: No tinnitus or ear pain.  RESPIRATORY: No cough, shortness of breath, wheezing or hemoptysis.  CARDIOVASCULAR: No chest pain, orthopnea, edema.  GASTROINTESTINAL: No nausea, vomiting, diarrhea or  abdominal pain.  GENITOURINARY: No dysuria, hematuria.  ENDOCRINE: No polyuria, nocturia,  HEMATOLOGY: No anemia, easy bruising or bleeding SKIN: No rash or lesion. MUSCULOSKELETAL: No joint pain or arthritis.   NEUROLOGIC: No tingling, numbness, weakness.  PSYCHIATRY: No anxiety or depression.   MEDICATIONS AT HOME:  Prior to Admission medications   Medication Sig Start Date End Date Taking? Authorizing Provider  ammonium lactate (AMLACTIN) 12 % cream Apply topically 2 (two) times daily.   Yes [provider]  Ferrous Sulfate 140 (45 FE) MG TBCR Take 1 tablet by mouth daily.    Yes [provider]  furosemide (LASIX) 40 MG tablet Take 40 mg by mouth 2 (two) times daily.   Yes [provider]  insulin aspart protamine- aspart (NOVOLOG MIX 70/30) (70-30) 100 UNIT/ML injection Inject 62-84 Units into the skin 2 (two) times daily with a meal. Pt uses 84 units with breakfast and 62 units with supper.   Yes [provider]  lactulose (CHRONULAC) 10 GM/15ML solution Take 40 g by mouth at bedtime.    Yes [provider]  LORazepam (ATIVAN) 0.5 MG tablet Take 0.5 mg by mouth every 8 (eight) hours as needed for anxiety.   Yes [provider]  omeprazole (PRILOSEC) 40 MG capsule Take 40 mg by mouth 2 (two) times daily.   Yes [provider]  rifaximin (XIFAXAN) 550 MG TABS tablet Take 550 mg by mouth 2 (two) times daily.   Yes [provider]  spironolactone (ALDACTONE) 25 MG tablet Take 25 mg by mouth daily.   Yes [provider]  traMADol (ULTRAM) 50 MG tablet Take by mouth every 6 (six) hours as needed.    Yes [provider]  oxyCODONE (ROXICODONE) 5 MG immediate release tablet Take 1 tablet (5 mg total) by mouth every 8 (eight) hours as needed. Patient not taking: Reported on 02/21/2017 11/15/16 11/15/17  Earleen Newport, MD  oxyCODONE-acetaminophen (PERCOCET) 7.5-325 MG tablet Take 1 tablet by mouth every  4 (four) hours as needed for severe pain. Patient not taking: Reported on 02/21/2017 11/15/16 11/15/17  Earleen Newport, MD      PHYSICAL EXAMINATION:   VITAL SIGNS: Blood pressure (!) 174/75, pulse 74, temperature 99.1 F (37.3 C), temperature source Oral, resp. rate 19, height 5\' 9"  (1.753 m), weight 136.1 kg (300 lb), SpO2 98 %.  GENERAL:  66 y.o.-year-old patient lying in the bed with no acute distress.  EYES: Pupils equal, round, reactive to light and accommodation. No scleral icterus. Extraocular muscles intact.  HEENT: Head atraumatic, normocephalic. Oropharynx and nasopharynx clear.  NECK:  Supple, no jugular venous distention. No thyroid enlargement, no tenderness.  LUNGS: Normal breath sounds bilaterally, no wheezing, rales,rhonchi or crepitation. No use of accessory muscles of respiration.  CARDIOVASCULAR: S1, S2 normal. No murmurs, rubs, or gallops.  ABDOMEN: Soft, nontender, nondistended. Bowel sounds present. No organomegaly or mass.  EXTREMITIES: Bilateral chronic leg edema and skin changes, left leg has redness and warm to touch also extending up to his thigh. NEUROLOGIC: Cranial nerves II through XII are intact. Muscle strength 3-4/5 in all extremities. Sensation intact. Gait not checked.  PSYCHIATRIC: The patient is alert and oriented x 3.  SKIN: No obvious rash, lesion, or ulcer.   LABORATORY PANEL:  CBC  Recent Labs Lab 02/21/17 1023  WBC 13.3*  HGB 10.4*  HCT 29.5*  PLT 123*  MCV 93.5  MCH 32.9  MCHC 35.2  RDW 14.3  LYMPHSABS 0.4*  MONOABS 0.5  EOSABS 0.0  BASOSABS 0.0   ------------------------------------------------------------------------------------------------------------------  Chemistries   Recent Labs Lab 02/21/17 1023  NA 133*  K 3.7  CL 95*  CO2 24  GLUCOSE 361*  BUN 25*  CREATININE 2.16*  CALCIUM 9.7  AST 57*  ALT 24  ALKPHOS 70  BILITOT 1.4*    ------------------------------------------------------------------------------------------------------------------ estimated creatinine clearance is 46.7 mL/min (A) (by C-G formula based on SCr of 2.16 mg/dL (H)). ------------------------------------------------------------------------------------------------------------------ No results for input(s): TSH, T4TOTAL, T3FREE, THYROIDAB in the last 72 hours.  Invalid input(s): FREET3   Coagulation profile No results for input(s): INR, PROTIME in the last 168 hours. ------------------------------------------------------------------------------------------------------------------- No results for input(s): DDIMER in the last 72 hours. -------------------------------------------------------------------------------------------------------------------  Cardiac Enzymes No results for input(s): CKMB, TROPONINI, MYOGLOBIN in the last 168 hours.  Invalid input(s): CK ------------------------------------------------------------------------------------------------------------------ Invalid input(s): POCBNP  ---------------------------------------------------------------------------------------------------------------  Urinalysis    Component Value Date/Time   COLORURINE YELLOW (A) 02/21/2017 1023   APPEARANCEUR CLEAR (A) 02/21/2017 1023   APPEARANCEUR Clear 09/10/2014 0456   LABSPEC 1.012 02/21/2017 1023   LABSPEC 1.016 09/10/2014 0456   PHURINE 6.0 02/21/2017 1023   GLUCOSEU >=500 (A) 02/21/2017 1023   GLUCOSEU Negative 09/10/2014 0456   HGBUR NEGATIVE 02/21/2017 [place] 02/21/2017 1023   BILIRUBINUR Negative 09/10/2014 0456   KETONESUR NEGATIVE 02/21/2017 1023   PROTEINUR 30 (A) 02/21/2017 1023   NITRITE NEGATIVE 02/21/2017 1023   LEUKOCYTESUR NEGATIVE 02/21/2017 1023   LEUKOCYTESUR Negative 09/10/2014 0456     RADIOLOGY: Ct Abdomen Pelvis Wo Contrast  Result Date: 02/21/2017 CLINICAL DATA:  66 year old male  status post fall, found unresponsive. Blunt trauma. EXAM: CT ABDOMEN AND PELVIS WITHOUT CONTRAST TECHNIQUE: Multidetector CT imaging of the abdomen and pelvis was performed following the standard protocol without IV contrast. COMPARISON:  Noncontrast CT Abdomen and Pelvis 11/15/2016. FINDINGS: Lower chest: Negative lung bases. No pericardial or pleural effusion. Mild cardiomegaly. Calcified coronary artery atherosclerosis. Hepatobiliary: Nodular liver contour re- demonstrated. Several calcified liver granulomas are unchanged. Chronic cholelithiasis. No pericholecystic inflammation. Pancreas: Negative. Spleen: Splenomegaly with splenorenal varices. Splenic size appears mildly increased since February. Estimated splenic volume 889 mL (normal splenic volume range 83 - 412 mL). However, small volume upper abdominal ascites has resolved. No abdominal free fluid. Adrenals/Urinary Tract: Negative adrenal glands. Negative noncontrast kidneys, ureters, and urinary bladder except for left splenorenal shunt. Stomach/Bowel: Negative distal colon aside from retained stool. Oral contrast has reached the hepatic flexure. Redundant right colon. The cecum is on a lax mesentery. No large bowel inflammation. The appendix is diminutive and normal. No dilated small bowel. Negative noncontrast stomach and duodenum. Vascular/Lymphatic: Vascular patency is not evaluated in the absence of IV contrast. Mild Calcified aortic atherosclerosis. Stable small abdominal and porta hepatis lymph nodes. Reproductive: Negative. Other: No pelvic free fluid. Small fat containing umbilical hernia appears stable on series 2, image 67. No definite superficial soft tissue injury is identified; mild nonspecific fat stranding along both flanks has regressed since February. Musculoskeletal: Flowing osteophytes or syndesmophytes in the spine. No lower rib fracture identified. Visible sternum appears intact. Pelvis appears intact. No acute osseous abnormality  identified. IMPRESSION: 1.  No acute traumatic injury identified on this noncontrast study. 2. Cirrhosis with splenomegaly which has mildly progressed since February. Chronic splenorenal shunt. Interval resolved ascites.  3. Mild Calcified aortic atherosclerosis. Stable small fat containing umbilical hernia. Electronically Signed   By: Genevie Ann M.D.   On: 02/21/2017 14:53   Ct Head Wo Contrast  Result Date: 02/21/2017 CLINICAL DATA:  MONTERRIUS CARDOSA is a 66 y.o. male brought to the ED by EMS due to generalized weakness. Patient called EMS due to being stuck on the floor between his couch recliner. EMS had to break into the house to retrieve him. Patient was too weak to stand. EXAM: CT HEAD WITHOUT CONTRAST TECHNIQUE: Contiguous axial images were obtained from the base of the skull through the vertex without intravenous contrast. COMPARISON:  05/09/2014 FINDINGS: Brain: There is mild central and cortical atrophy. There is no intra or extra-axial fluid collection or mass lesion. The basilar cisterns and ventricles have a normal appearance. There is no CT evidence for acute infarction or hemorrhage. Vascular: No hyperdense vessel or unexpected calcification. Skull: Normal. Negative for fracture or focal lesion. Sinuses/Orbits: No acute finding. Other: None IMPRESSION: 1.  No evidence for acute intracranial abnormality. 2. Mild atrophy. Electronically Signed   By: Nolon Nations M.D.   On: 02/21/2017 16:40    EKG: Orders placed or performed during the hospital encounter of 02/21/17  . EKG 12-Lead  . EKG 12-Lead    IMPRESSION AND PLAN:  * Cellulitis on left leg   We'll give IV vancomycin and Zosyn for now and check MRSA screen.  * Uncontrolled diabetes   Likely secondary to cellulitis, do 50 units of 70/30 insulin twice a day and keep on insulin sliding scale coverage.  * Generalized weakness   CT scan of the head is negative, likely secondary to infection and uncontrolled diabetes   We'll get  physical therapy evaluation on him.  * Liver cirrhosis   Follows with GI clinic, continue rifaximin, spironolactone, lactulose.   All the records are reviewed and case discussed with ED provider. Management plans discussed with the patient, family and they are in agreement.  CODE STATUS: Full code Code Status History    Date Active Date Inactive Code Status Order ID Comments User Context   07/20/2015 12:45 PM 07/22/2015 10:17 PM Full Code [id number]  Nicholes Mango, MD Inpatient   02/08/2015 11:31 PM 02/11/2015  7:43 PM Full Code [id number]  Hower, Aaron Mose, MD ED     Patient's niece and her husband are present in the room during my visit.  TOTAL TIME TAKING CARE OF THIS PATIENT: 50 minutes.    [name] M.D on 02/21/2017   Between 7am to 6pm - Pager - [phone]  After 6pm go to [email or web address] - password [email or web address] [place] [facility]  Office  [phone]  CC: Primary care physician; Madelyn Brunner, MD   Note: This dictation was prepared with Dragon dictation along with smaller phrase technology. Any transcriptional errors that result from this process are unintentional.

## 2017-02-21 NOTE — ED Notes (Signed)
Patient transported to CT 

## 2017-02-21 NOTE — ED Notes (Signed)
Pt provided cup of water per request.

## 2017-02-21 NOTE — ED Notes (Signed)
Soiled pants, brief and linen changed at this time.

## 2017-02-21 NOTE — ED Notes (Signed)
Incontinence at baseline per patient.

## 2017-02-21 NOTE — ED Notes (Signed)
Reddened area on left leg outlined with skin marker

## 2017-02-21 NOTE — Progress Notes (Signed)
Family Meeting Note  Advance Directive:no  Today a meeting took place with the Patient and his neice.  The following clinical team members were present during this meeting:MD  The following were discussed:Patient's diagnosis: liver cirrhosis, cellulitis , Patient's progosis: Unable to determine and Goals for treatment: Full Code  Additional follow-up to be provided: PT, PMD  Time spent during discussion:20 minutes  Soniya Ashraf, Rosalio Macadamia, MD

## 2017-02-21 NOTE — ED Triage Notes (Signed)
Patient presents to ED via [facility] post fall. EMS was called for an unresponsive unknown downtime patient. Upon arrival EMS reports patient was stuck in between his couch and his recliner. Patient states, "I think I feel this morning around 0630". EMS assisted patient off the floor. CBG here 349. Patient was incontinent of bowel and bladder.

## 2017-02-21 NOTE — ED Provider Notes (Addendum)
[facility] Emergency Department Provider Note  ____________________________________________  Time seen: Approximately 1:54 PM  I have reviewed the triage vital signs and the nursing notes.   HISTORY  Chief Complaint Fall  Level 5 caveat:  Portions of the history and physical were unable to be obtained due to the patient's acute illness and poor historian  HPI Corey West is a 66 y.o. male brought to the ED by EMS due to generalized weakness. Patient called EMS due to being stuck on the floor between his couch recliner. EMS had to break into the house to retrieve him. They helped him up and found the patient was too weak to stand on his own. Patient denies pain and reports compliance with his medications. As diabetes, insulin-dependent, twice a day.   On further history around 1:30 PM after patient received a large IV saline bolus for hydration, patient is more alert and reports that he doesn't take his lactulose twice a day like he is supposed to but only once a day. He reports feeling better after fluids. Denies pain. States he feels like he needs to urinate and have a bowel movement.  Past Medical History:  Diagnosis Date  . Anemia   . Anxiety   . Arthritis   . Barrett's esophagus   . Cirrhosis ([place])   . Depression   . Diabetes mellitus without complication ([place])   . GERD (gastroesophageal reflux disease)   . History of hiatal hernia   . Hypertension   . Sleep apnea      Patient Active Problem List   Diagnosis Date Noted  . GI bleed 07/20/2015  . Hematemesis 02/08/2015  . Cirrhosis ([place]) 02/08/2015  . 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 Type 2 diabetes mellitus ([place]) 02/08/2015  . Gastroesophageal reflux disease 02/08/2015  . Hyperlipidemia 02/08/2015  . Anemia 12/04/2013  . Thrombocytopenia ([place]) 12/04/2013  . Splenomegaly 11/04/2013     Past Surgical History:  Procedure Laterality Date  . ESOPHAGOGASTRODUODENOSCOPY N/A 02/09/2015    Procedure: ESOPHAGOGASTRODUODENOSCOPY (EGD);  Surgeon: Manya Silvas, MD;  Location: [facility] ENDOSCOPY;  Service: Endoscopy;  Laterality: N/A;  . ESOPHAGOGASTRODUODENOSCOPY N/A 07/22/2015   Procedure: ESOPHAGOGASTRODUODENOSCOPY (EGD);  Surgeon: Manya Silvas, MD;  Location: [facility] ENDOSCOPY;  Service: Endoscopy;  Laterality: N/A;  . ESOPHAGOGASTRODUODENOSCOPY (EGD) WITH PROPOFOL N/A 07/20/2015   Procedure: ESOPHAGOGASTRODUODENOSCOPY (EGD) WITH PROPOFOL;  Surgeon: Manya Silvas, MD;  Location: [facility] ENDOSCOPY;  Service: Endoscopy;  Laterality: N/A;  . ESOPHAGOGASTRODUODENOSCOPY (EGD) WITH PROPOFOL N/A 09/16/2015   Procedure: ESOPHAGOGASTRODUODENOSCOPY (EGD) WITH PROPOFOL;  Surgeon: Manya Silvas, MD;  Location: [facility] ENDOSCOPY;  Service: Endoscopy;  Laterality: N/A;  . ESOPHAGOGASTRODUODENOSCOPY (EGD) WITH PROPOFOL N/A 03/16/2016   Procedure: ESOPHAGOGASTRODUODENOSCOPY (EGD) WITH PROPOFOL;  Surgeon: Manya Silvas, MD;  Location: [facility] ENDOSCOPY;  Service: Endoscopy;  Laterality: N/A;  . ESOPHAGOGASTRODUODENOSCOPY (EGD) WITH PROPOFOL N/A 09/14/2016   Procedure: ESOPHAGOGASTRODUODENOSCOPY (EGD) WITH PROPOFOL;  Surgeon: Manya Silvas, MD;  Location: [facility] ENDOSCOPY;  Service: Endoscopy;  Laterality: N/A;  . ESOPHAGOGASTRODUODENOSCOPY (EGD) WITH PROPOFOL N/A 11/05/2016   Procedure: ESOPHAGOGASTRODUODENOSCOPY (EGD) WITH PROPOFOL;  Surgeon: Manya Silvas, MD;  Location: [facility] ENDOSCOPY;  Service: Endoscopy;  Laterality: N/A;  . ESOPHAGOGASTRODUODENOSCOPY (EGD) WITH PROPOFOL N/A 02/06/2017   Procedure: ESOPHAGOGASTRODUODENOSCOPY (EGD) WITH PROPOFOL;  Surgeon: Manya Silvas, MD;  Location: [facility] ENDOSCOPY;  Service: Endoscopy;  Laterality: N/A;  . TONSILLECTOMY    . TONSILLECTOMY AND ADENOIDECTOMY    . ULNAR NERVE TRANSPOSITION    . UVULOPALATOPHARYNGOPLASTY  Prior to Admission medications   Medication Sig Start Date End Date Taking? Authorizing Provider  ammonium lactate (AMLACTIN)  12 % cream Apply topically 2 (two) times daily.    [provider]  Ferrous Sulfate 140 (45 FE) MG TBCR Take 1 tablet by mouth daily.     [provider]  furosemide (LASIX) 40 MG tablet Take 40 mg by mouth 2 (two) times daily.    [provider]  insulin aspart protamine- aspart (NOVOLOG MIX 70/30) (70-30) 100 UNIT/ML injection Inject 62-84 Units into the skin 2 (two) times daily with a meal. Pt uses 84 units with breakfast and 62 units with supper.    [provider]  lactulose (CHRONULAC) 10 GM/15ML solution Take 20 g by mouth 2 (two) times daily.    [provider]  LORazepam (ATIVAN) 0.5 MG tablet Take 0.5 mg by mouth every 8 (eight) hours as needed for anxiety.    [provider]  metolazone (ZAROXOLYN) 2.5 MG tablet Take 2.5 mg by mouth daily.     [provider]  nadolol (CORGARD) 40 MG tablet Take 40 mg by mouth daily.     [provider]  omeprazole (PRILOSEC) 40 MG capsule Take 40 mg by mouth 2 (two) times daily.    [provider]  oxyCODONE (ROXICODONE) 5 MG immediate release tablet Take 1 tablet (5 mg total) by mouth every 8 (eight) hours as needed. 11/15/16 11/15/17  Earleen Newport, MD  oxyCODONE-acetaminophen (PERCOCET) 7.5-325 MG tablet Take 1 tablet by mouth every 4 (four) hours as needed for severe pain. 11/15/16 11/15/17  Earleen Newport, MD  rifaximin (XIFAXAN) 550 MG TABS tablet Take 550 mg by mouth 2 (two) times daily.    [provider]  spironolactone (ALDACTONE) 25 MG tablet Take 25 mg by mouth daily.    [provider]  traMADol (ULTRAM) 50 MG tablet Take by mouth every 6 (six) hours as needed.     [provider]     Allergies Patient has no known allergies.   Family History  Problem Relation Age of Onset  . Diabetes Other     Social History Social History  Substance Use Topics  . Smoking status: Former Smoker    Quit date: 01/14/2017  .  Smokeless tobacco: Never Used  . Alcohol use Yes     Comment: stopped 12 years ago    Review of Systems  Constitutional:   No fever or chills.  ENT:   No sore throat. No rhinorrhea. Cardiovascular:   No chest pain or syncope. Respiratory:   No dyspnea or cough. Gastrointestinal:   Negative for abdominal pain, vomiting and diarrhea.  Musculoskeletal:   Negative for focal pain or swelling All other systems reviewed and are negative except as documented above in ROS and HPI.  ____________________________________________   PHYSICAL EXAM:  VITAL SIGNS: ED Triage Vitals  Enc Vitals Group     BP 02/21/17 1030 118/70     Pulse Rate 02/21/17 1015 94     Resp 02/21/17 1015 15     Temp 02/21/17 1015 99.1 F (37.3 C)     Temp Source 02/21/17 1015 Oral     SpO2 02/21/17 1015 96 %     Weight 02/21/17 1017 300 lb (136.1 kg)     Height 02/21/17 1017 5\' 9"  (1.753 m)     Head Circumference --      Peak Flow --      Pain Score 02/21/17 1015  0     Pain Loc --      Pain Edu? --      Excl. in [place]? --     Vital signs reviewed, nursing assessments reviewed.   Constitutional:   Alert and orientedTo person. Not in distress. Eyes:   No scleral icterus. No conjunctival pallor. PERRL. EOMI.  No nystagmus. ENT   Head:   Normocephalic and atraumatic.   Nose:   No congestion/rhinnorhea.    Mouth/Throat:   Dry mucous membranes, no pharyngeal erythema. No peritonsillar mass.    Neck:   No meningismus. Full ROM Hematological/Lymphatic/Immunilogical:   No cervical lymphadenopathy. Cardiovascular:   RRR. Symmetric bilateral radial and DP pulses.  No murmurs.  Respiratory:   Normal respiratory effort without tachypnea/retractions. Breath sounds are clear and equal bilaterally. No wheezes/rales/rhonchi. Gastrointestinal:   Soft with generalized tenderness. Non distended. There is no CVA tenderness.  No rebound, rigidity, or guarding. Genitourinary:   deferred Musculoskeletal:   Normal  range of motion in all extremities. No joint effusions.  No lower extremity tenderness.  No edema. Neurologic:   Normal speech.  CN 2-10 normal. Motor grossly intact. No gross focal neurologic deficits are appreciated.  Skin:    Skin is warm, dry and intact. No rash noted.  No petechiae, purpura, or bullae.  ____________________________________________    LABS (pertinent positives/negatives) (all labs ordered are listed, but only abnormal results are displayed) Labs Reviewed  GLUCOSE, CAPILLARY - Abnormal; Notable for the following:       Result Value   Glucose-Capillary 349 (*)    All other components within normal limits  COMPREHENSIVE METABOLIC PANEL - Abnormal; Notable for the following:    Sodium 133 (*)    Chloride 95 (*)    Glucose, Bld 361 (*)    BUN 25 (*)    Creatinine, Ser 2.16 (*)    Albumin 3.0 (*)    AST 57 (*)    Total Bilirubin 1.4 (*)    GFR calc non Af Amer 30 (*)    GFR calc Af Amer 35 (*)    All other components within normal limits  CBC WITH DIFFERENTIAL/PLATELET - Abnormal; Notable for the following:    WBC 13.3 (*)    RBC 3.16 (*)    Hemoglobin 10.4 (*)    HCT 29.5 (*)    Platelets 123 (*)    Neutro Abs 12.3 (*)    Lymphs Abs 0.4 (*)    All other components within normal limits  URINALYSIS, COMPLETE (UACMP) WITH MICROSCOPIC - Abnormal; Notable for the following:    Color, Urine YELLOW (*)    APPearance CLEAR (*)    Glucose, UA >=500 (*)    Protein, ur 30 (*)    Squamous Epithelial / LPF 0-5 (*)    All other components within normal limits  BLOOD GAS, VENOUS - Abnormal; Notable for the following:    pH, Ven 7.47 (*)    pCO2, Ven 36 (*)    pO2, Ven 50.0 (*)    Acid-Base Excess 2.6 (*)    All other components within normal limits  GLUCOSE, CAPILLARY - Abnormal; Notable for the following:    Glucose-Capillary 366 (*)    All other components within normal limits  AMMONIA - Abnormal; Notable for the following:    Ammonia 66 (*)    All other  components within normal limits  LIPASE, BLOOD  CBG MONITORING, ED   ____________________________________________   EKG  Interpreted by me Sinus rhythm rate  of 92, first-degree AV block with PR interval 220. Right axis. Normal ST segments and T waves.  ____________________________________________    RADIOLOGY  Ct Abdomen Pelvis Wo Contrast  Result Date: 02/21/2017 CLINICAL DATA:  66 year old male status post fall, found unresponsive. Blunt trauma. EXAM: CT ABDOMEN AND PELVIS WITHOUT CONTRAST TECHNIQUE: Multidetector CT imaging of the abdomen and pelvis was performed following the standard protocol without IV contrast. COMPARISON:  Noncontrast CT Abdomen and Pelvis 11/15/2016. FINDINGS: Lower chest: Negative lung bases. No pericardial or pleural effusion. Mild cardiomegaly. Calcified coronary artery atherosclerosis. Hepatobiliary: Nodular liver contour re- demonstrated. Several calcified liver granulomas are unchanged. Chronic cholelithiasis. No pericholecystic inflammation. Pancreas: Negative. Spleen: Splenomegaly with splenorenal varices. Splenic size appears mildly increased since February. Estimated splenic volume 889 mL (normal splenic volume range 83 - 412 mL). However, small volume upper abdominal ascites has resolved. No abdominal free fluid. Adrenals/Urinary Tract: Negative adrenal glands. Negative noncontrast kidneys, ureters, and urinary bladder except for left splenorenal shunt. Stomach/Bowel: Negative distal colon aside from retained stool. Oral contrast has reached the hepatic flexure. Redundant right colon. The cecum is on a lax mesentery. No large bowel inflammation. The appendix is diminutive and normal. No dilated small bowel. Negative noncontrast stomach and duodenum. Vascular/Lymphatic: Vascular patency is not evaluated in the absence of IV contrast. Mild Calcified aortic atherosclerosis. Stable small abdominal and porta hepatis lymph nodes. Reproductive: Negative. Other: No  pelvic free fluid. Small fat containing umbilical hernia appears stable on series 2, image 67. No definite superficial soft tissue injury is identified; mild nonspecific fat stranding along both flanks has regressed since February. Musculoskeletal: Flowing osteophytes or syndesmophytes in the spine. No lower rib fracture identified. Visible sternum appears intact. Pelvis appears intact. No acute osseous abnormality identified. IMPRESSION: 1.  No acute traumatic injury identified on this noncontrast study. 2. Cirrhosis with splenomegaly which has mildly progressed since February. Chronic splenorenal shunt. Interval resolved ascites. 3. Mild Calcified aortic atherosclerosis. Stable small fat containing umbilical hernia. Electronically Signed   By: Genevie Ann M.D.   On: 02/21/2017 14:53    ____________________________________________   PROCEDURES Procedures  ____________________________________________   INITIAL IMPRESSION / ASSESSMENT AND PLAN / ED COURSE  Pertinent labs & imaging results that were available during my care of the patient were reviewed by me and considered in my medical decision making (see chart for details).  Patient presents with generalized weakness. Blood sugar noted to be over 400 on arrival. Patient given 2 L normal saline bolus, check labs out of concern for DKA. Labs are essentially unremarkable with baseline CK D and no evidence of acidosis or ketosis. With his belly pain and poorly controlled diabetes I ordered a CT scan. On reassessment the patient is feeling better and more alert and energetic. Checking an ammonia as well due to his history of liver disease. EMS has notified DSS due to very poor living conditions including stool throughout the house and poor sanitation.     ----------------------------------------- 3:37 PM on 02/21/2017 -----------------------------------------  Workup entirely negative. Ammonia is slightly elevated but not significant so and after  fluids his mental status is clear. However patient is very dizzy with standing and unable to support himself. Appears his condition is not much improved since he was found wedged between his furniture this morning by EMS. This is despite control of his blood sugar and hydration. Plan for hospital observation for continued hydration. We'll get a CT of the head.   ____________________________________________   FINAL CLINICAL IMPRESSION(S) / ED DIAGNOSES  Final diagnoses:  Hyperglycemia  Dehydration  Generalized weakness      New Prescriptions   No medications on file     Portions of this note were generated with dragon dictation software. Dictation errors may occur despite best attempts at proofreading.    Carrie Mew, MD 02/21/17 1537    Carrie Mew, MD 02/21/17 [phone]

## 2017-02-21 NOTE — Discharge Instructions (Signed)
Your  CT scan and labs today were unremarkable except for high blood sugar. Continue to drink lots of water and stay hydrated. Be sure to take all your medications including your lactulose and rifaximin as prescribed and follow-up with your doctor tomorrow.

## 2017-02-21 NOTE — ED Notes (Signed)
In and out cath complete by Tye Maryland, NS under the supervision of this RN. Sterile technique maintained. Patient tolerated well. Clear, amber urine returned.

## 2017-02-21 NOTE — ED Notes (Signed)
CT notified that pt if finished with oral contrast.

## 2017-02-21 NOTE — ED Notes (Signed)
Pt stated he needed to have a BM, states unsteady on feet. Pt placed on bedpan. Given call bell.

## 2017-02-21 NOTE — Progress Notes (Signed)
ANTIBIOTIC CONSULT NOTE - INITIAL  Pharmacy Consult for Vancomycin and Zosyn  Indication: Cellulitis of diabetic foot  No Known Allergies  Patient Measurements: Height: 5\' 9"  (175.3 cm) Weight: 300 lb (136.1 kg) IBW/kg (Calculated) : 70.7 Adjusted Body Weight:   Vital Signs: Temp: 99.1 F (37.3 C) (05/24 1015) Temp Source: Oral (05/24 1015) BP: 174/75 (05/24 1700) Pulse Rate: 74 (05/24 1700) Intake/Output from previous day: No intake/output data recorded. Intake/Output from this shift: Total I/O In: 2000 [IV Piggyback:2000] Out: 250 [Urine:250]  Labs:  Recent Labs  02/21/17 1023  WBC 13.3*  HGB 10.4*  PLT 123*  CREATININE 2.16*   Estimated Creatinine Clearance: 46.7 mL/min (A) (by C-G formula based on SCr of 2.16 mg/dL (H)). No results for input(s): VANCOTROUGH, VANCOPEAK, VANCORANDOM, GENTTROUGH, GENTPEAK, GENTRANDOM, TOBRATROUGH, TOBRAPEAK, TOBRARND, AMIKACINPEAK, AMIKACINTROU, AMIKACIN in the last 72 hours.   Microbiology: No results found for this or any previous visit (from the past 720 hour(s)).  Medical History: Past Medical History:  Diagnosis Date  . Anemia   . Anxiety   . Arthritis   . Barrett's esophagus   . Cirrhosis ([place])   . Depression   . Diabetes mellitus without complication ([place])   . GERD (gastroesophageal reflux disease)   . History of hiatal hernia   . Hypertension   . Sleep apnea     Medications:    Assessment: Pharmacy consulted to dose and monitor Vancomycin and Zosyn in this 66 year old male for cellulitis. Patient serum creatinine is 2.16 mg/dl, which give concern of possible AKI  Goal of Therapy:  Vancomycin trough level 15-20 mcg/ml  Plan:  Will give Vancomycin 2 g IV x 1 and will then check serum creatinine with am labs. If creatinine has improved, could start patient on Vancomycin regimen. Random level order for ~24 hours post dose (will discontinue if renal function has improved significantly)  Zosyn: Will dose as if  CrCl <10 ml/min due to AKI. Will start Zosyn 3.375 g IV q12 hours  Lanphear,Somaya Grassi D 02/21/2017,5:57 PM

## 2017-02-22 DIAGNOSIS — E860 Dehydration: Secondary | ICD-10-CM | POA: Diagnosis not present

## 2017-02-22 DIAGNOSIS — N179 Acute kidney failure, unspecified: Secondary | ICD-10-CM | POA: Diagnosis not present

## 2017-02-22 LAB — BASIC METABOLIC PANEL
Anion gap: 3 — ABNORMAL LOW (ref 5–15)
BUN: 24 mg/dL — AB (ref 6–20)
CO2: 29 mmol/L (ref 22–32)
CREATININE: 1.91 mg/dL — AB (ref 0.61–1.24)
Calcium: 9 mg/dL (ref 8.9–10.3)
Chloride: 102 mmol/L (ref 101–111)
GFR calc Af Amer: 41 mL/min — ABNORMAL LOW (ref >60)
GFR, EST NON AFRICAN AMERICAN: 35 mL/min — AB (ref >60)
GLUCOSE: 289 mg/dL — AB (ref 65–99)
POTASSIUM: 3 mmol/L — AB (ref 3.5–5.1)
SODIUM: 134 mmol/L — AB (ref 135–145)

## 2017-02-22 LAB — CBC
HEMATOCRIT: 25.3 % — AB (ref 40.0–52.0)
Hemoglobin: 9 g/dL — ABNORMAL LOW (ref 13.0–18.0)
MCH: 32.8 pg (ref 26.0–34.0)
MCHC: 35.4 g/dL (ref 32.0–36.0)
MCV: 92.4 fL (ref 80.0–100.0)
Platelets: 87 10*3/uL — ABNORMAL LOW (ref 150–440)
RBC: 2.74 MIL/uL — ABNORMAL LOW (ref 4.40–5.90)
RDW: 14.8 % — AB (ref 11.5–14.5)
WBC: 7.3 10*3/uL (ref 3.8–10.6)

## 2017-02-22 LAB — MRSA PCR SCREENING: MRSA by PCR: NEGATIVE

## 2017-02-22 LAB — GLUCOSE, CAPILLARY
Glucose-Capillary: 280 mg/dL — ABNORMAL HIGH (ref 65–99)
Glucose-Capillary: 304 mg/dL — ABNORMAL HIGH (ref 65–99)

## 2017-02-22 MED ORDER — POTASSIUM CHLORIDE CRYS ER 20 MEQ PO TBCR
40.0000 meq | EXTENDED_RELEASE_TABLET | Freq: Once | ORAL | Status: AC
  Administered 2017-02-22: 40 meq via ORAL
  Filled 2017-02-22: qty 2

## 2017-02-22 MED ORDER — FERROUS SULFATE 325 (65 FE) MG PO TABS
325.0000 mg | ORAL_TABLET | Freq: Two times a day (BID) | ORAL | Status: DC
  Administered 2017-02-22: 09:00:00 325 mg via ORAL
  Filled 2017-02-22: qty 1

## 2017-02-22 MED ORDER — INSULIN ASPART PROT & ASPART (70-30 MIX) 100 UNIT/ML ~~LOC~~ SUSP: 50.0000 [IU] | Freq: Two times a day (BID) | SUBCUTANEOUS | 11 refills | Status: DC

## 2017-02-22 MED ORDER — AMOXICILLIN-POT CLAVULANATE 875-125 MG PO TABS: 1.0000 | ORAL_TABLET | Freq: Two times a day (BID) | ORAL | 0 refills | Status: DC

## 2017-02-22 MED ORDER — VANCOMYCIN HCL 10 G IV SOLR
1250.0000 mg | INTRAVENOUS | Status: DC
  Administered 2017-02-22: 1250 mg via INTRAVENOUS
  Filled 2017-02-22: qty 1250

## 2017-02-22 MED ORDER — AMOXICILLIN-POT CLAVULANATE 875-125 MG PO TABS
1.0000 | ORAL_TABLET | Freq: Two times a day (BID) | ORAL | Status: DC
  Administered 2017-02-22: 1 via ORAL
  Filled 2017-02-22: qty 1

## 2017-02-22 MED ORDER — POTASSIUM CHLORIDE CRYS ER 20 MEQ PO TBCR
40.0000 meq | EXTENDED_RELEASE_TABLET | Freq: Once | ORAL | Status: AC
  Administered 2017-02-22: 12:00:00 40 meq via ORAL
  Filled 2017-02-22: qty 2

## 2017-02-22 MED ORDER — PIPERACILLIN-TAZOBACTAM 3.375 G IVPB
3.3750 g | Freq: Three times a day (TID) | INTRAVENOUS | Status: DC
  Filled 2017-02-22 (×2): qty 50

## 2017-02-22 NOTE — Discharge Summary (Signed)
[place] [facility] [place] NAME: Corey West    MR#:  [id number]  DATE OF BIRTH:  10-14-1950  DATE OF ADMISSION:  02/21/2017 ADMITTING PHYSICIAN: [name], MD  DATE OF DISCHARGE: 02/22/17  PRIMARY CARE PHYSICIAN: Madelyn Brunner, MD    ADMISSION DIAGNOSIS:  Dehydration [E86.0] Hyperglycemia [R73.9] Generalized weakness [R53.1]  DISCHARGE DIAGNOSIS:  Acute on chronic renal failure due to dehydration actue on chronic LEFT LE mild cellulitis Chronic lymphedema/chronic venous insufficiency, bilateral LE  SECONDARY DIAGNOSIS:   Past Medical History:  Diagnosis Date  . Anemia   . Anxiety   . Arthritis   . Barrett's esophagus   . Cirrhosis ([place])   . Depression   . Diabetes mellitus without complication ([place])   . GERD (gastroesophageal reflux disease)   . History of hiatal hernia   . Hypertension   . Sleep apnea     HOSPITAL COURSE:  Corey West is a 66 y.o. male brought to the ED by EMS due to generalized weakness. Patient called EMS due to being stuck on the floor between his couch recliner. EMS had to break into the house to retrieve him. They helped him up and found the patient was too weak to stand on his own  * acute on chronic renal failure due to dehydration -received IVF -creat improved -2.16---1.9 (baseline 1.8)  * Cellulitis on left leg., mild. Appears more venous insufficiency   was on IV vancomycin and Zosyn ---change to augmentin -pt has significant dry and scaly skin with lymphedema  * Uncontrolled diabetes   Likely secondary to cellulitis, do 50 units of 70/30 insulin twice a day and keep on insulin sliding scale coverage.  * Generalized weakness   CT scan of the head is negative, likely secondary to infection and uncontrolled diabetes   PT to see pt Pt declining to go to rehab if suggested. He works and therefore does not want to miss work.  * Liver cirrhosis    continue rifaximin,  spironolactone, lactulose.  D/c planning after seen by PT. CONSULTS OBTAINED:    DRUG ALLERGIES:  No Known Allergies  DISCHARGE MEDICATIONS:   Current Discharge Medication List    START taking these medications   Details  amoxicillin-clavulanate (AUGMENTIN) 875-125 MG tablet Take 1 tablet by mouth every 12 (twelve) hours. Qty: 12 tablet, Refills: 0      CONTINUE these medications which have CHANGED   Details  insulin aspart protamine- aspart (NOVOLOG MIX 70/30) (70-30) 100 UNIT/ML injection Inject 0.5 mLs (50 Units total) into the skin 2 (two) times daily with a meal. Pt uses 84 units with breakfast and 62 units with supper. Qty: 10 mL, Refills: 11      CONTINUE these medications which have NOT CHANGED   Details  ammonium lactate (AMLACTIN) 12 % cream Apply topically 2 (two) times daily.    Ferrous Sulfate 140 (45 FE) MG TBCR Take 1 tablet by mouth daily.     furosemide (LASIX) 40 MG tablet Take 40 mg by mouth 2 (two) times daily.    lactulose (CHRONULAC) 10 GM/15ML solution Take 40 g by mouth at bedtime.     LORazepam (ATIVAN) 0.5 MG tablet Take 0.5 mg by mouth every 8 (eight) hours as needed for anxiety.    omeprazole (PRILOSEC) 40 MG capsule Take 40 mg by mouth 2 (two) times daily.    rifaximin (XIFAXAN) 550 MG TABS tablet Take 550 mg by mouth 2 (two) times  daily.    spironolactone (ALDACTONE) 25 MG tablet Take 25 mg by mouth daily.    traMADol (ULTRAM) 50 MG tablet Take by mouth every 6 (six) hours as needed.       STOP taking these medications     oxyCODONE (ROXICODONE) 5 MG immediate release tablet      oxyCODONE-acetaminophen (PERCOCET) 7.5-325 MG tablet         If you experience worsening of your admission symptoms, develop shortness of breath, life threatening emergency, suicidal or homicidal thoughts you must seek medical attention immediately by calling 911 or calling your MD immediately  if symptoms less severe.  You Must read complete  instructions/literature along with all the possible adverse reactions/side effects for all the Medicines you take and that have been prescribed to you. Take any new Medicines after you have completely understood and accept all the possible adverse reactions/side effects.   Please note  You were cared for by a hospitalist during your hospital stay. If you have any questions about your discharge medications or the care you received while you were in the hospital after you are discharged, you can call the unit and asked to speak with the hospitalist on call if the hospitalist that took care of you is not available. Once you are discharged, your primary care physician will handle any further medical issues. Please note that NO REFILLS for any discharge medications will be authorized once you are discharged, as it is imperative that you return to your primary care physician (or establish a relationship with a primary care physician if you do not have one) for your aftercare needs so that they can reassess your need for medications and monitor your lab values. Today   SUBJECTIVE   Feels better Wants to go home  VITAL SIGNS:  Blood pressure (!) 115/55, pulse 64, temperature 98.3 F (36.8 C), temperature source Oral, resp. rate 20, height 5\' 10"  (1.778 m), weight (!) 138.3 kg (304 lb 14.4 oz), SpO2 99 %.  I/O:   Intake/Output Summary (Last 24 hours) at 02/22/17 1058 Last data filed at 02/22/17 0500  Gross per 24 hour  Intake             2050 ml  Output              250 ml  Net             1800 ml    PHYSICAL EXAMINATION:  GENERAL:  66 y.o.-year-old patient lying in the bed with no acute distress.  EYES: Pupils equal, round, reactive to light and accommodation. No scleral icterus. Extraocular muscles intact.  HEENT: Head atraumatic, normocephalic. Oropharynx and nasopharynx clear.  NECK:  Supple, no jugular venous distention. No thyroid enlargement, no tenderness.  LUNGS: Normal breath sounds  bilaterally, no wheezing, rales,rhonchi or crepitation. No use of accessory muscles of respiration.  CARDIOVASCULAR: S1, S2 normal. No murmurs, rubs, or gallops.  ABDOMEN: Soft, non-tender, non-distended. Bowel sounds present. No organomegaly or mass.  EXTREMITIES:bilateral lymphedema with chronic venous stasis changes in both extremities. Skin marking+ Dry scaly skin NEUROLOGIC: Cranial nerves II through XII are intact. Muscle strength 5/5 in all extremities. Sensation intact. Gait not checked.  PSYCHIATRIC: The patient is alert and oriented x 3.  SKIN: No obvious rash, lesion, or ulcer.   DATA REVIEW:   CBC   Recent Labs Lab 02/22/17 0359  WBC 7.3  HGB 9.0*  HCT 25.3*  PLT 87*    Chemistries   Recent Labs  Lab 02/21/17 1023 02/22/17 0359  NA 133* 134*  K 3.7 3.0*  CL 95* 102  CO2 24 29  GLUCOSE 361* 289*  BUN 25* 24*  CREATININE 2.16* 1.91*  CALCIUM 9.7 9.0  AST 57*  --   ALT 24  --   ALKPHOS 70  --   BILITOT 1.4*  --     Microbiology Results   Recent Results (from the past 240 hour(s))  MRSA PCR Screening     Status: None   Collection Time: 02/21/17 10:57 PM  Result Value Ref Range Status   MRSA by PCR NEGATIVE NEGATIVE Final    Comment:        The GeneXpert MRSA Assay (FDA approved for NASAL specimens only), is one component of a comprehensive MRSA colonization surveillance program. It is not intended to diagnose MRSA infection nor to guide or monitor treatment for MRSA infections.     RADIOLOGY:  Ct Abdomen Pelvis Wo Contrast  Result Date: 02/21/2017 CLINICAL DATA:  66 year old male status post fall, found unresponsive. Blunt trauma. EXAM: CT ABDOMEN AND PELVIS WITHOUT CONTRAST TECHNIQUE: Multidetector CT imaging of the abdomen and pelvis was performed following the standard protocol without IV contrast. COMPARISON:  Noncontrast CT Abdomen and Pelvis 11/15/2016. FINDINGS: Lower chest: Negative lung bases. No pericardial or pleural effusion. Mild  cardiomegaly. Calcified coronary artery atherosclerosis. Hepatobiliary: Nodular liver contour re- demonstrated. Several calcified liver granulomas are unchanged. Chronic cholelithiasis. No pericholecystic inflammation. Pancreas: Negative. Spleen: Splenomegaly with splenorenal varices. Splenic size appears mildly increased since February. Estimated splenic volume 889 mL (normal splenic volume range 83 - 412 mL). However, small volume upper abdominal ascites has resolved. No abdominal free fluid. Adrenals/Urinary Tract: Negative adrenal glands. Negative noncontrast kidneys, ureters, and urinary bladder except for left splenorenal shunt. Stomach/Bowel: Negative distal colon aside from retained stool. Oral contrast has reached the hepatic flexure. Redundant right colon. The cecum is on a lax mesentery. No large bowel inflammation. The appendix is diminutive and normal. No dilated small bowel. Negative noncontrast stomach and duodenum. Vascular/Lymphatic: Vascular patency is not evaluated in the absence of IV contrast. Mild Calcified aortic atherosclerosis. Stable small abdominal and porta hepatis lymph nodes. Reproductive: Negative. Other: No pelvic free fluid. Small fat containing umbilical hernia appears stable on series 2, image 67. No definite superficial soft tissue injury is identified; mild nonspecific fat stranding along both flanks has regressed since February. Musculoskeletal: Flowing osteophytes or syndesmophytes in the spine. No lower rib fracture identified. Visible sternum appears intact. Pelvis appears intact. No acute osseous abnormality identified. IMPRESSION: 1.  No acute traumatic injury identified on this noncontrast study. 2. Cirrhosis with splenomegaly which has mildly progressed since February. Chronic splenorenal shunt. Interval resolved ascites. 3. Mild Calcified aortic atherosclerosis. Stable small fat containing umbilical hernia. Electronically Signed   By: Genevie Ann West.   On: 02/21/2017 14:53    Ct Head Wo Contrast  Result Date: 02/21/2017 CLINICAL DATA:  Corey West is a 66 y.o. male brought to the ED by EMS due to generalized weakness. Patient called EMS due to being stuck on the floor between his couch recliner. EMS had to break into the house to retrieve him. Patient was too weak to stand. EXAM: CT HEAD WITHOUT CONTRAST TECHNIQUE: Contiguous axial images were obtained from the base of the skull through the vertex without intravenous contrast. COMPARISON:  05/09/2014 FINDINGS: Brain: There is mild central and cortical atrophy. There is no intra or extra-axial fluid collection or mass lesion. The basilar cisterns  and ventricles have a normal appearance. There is no CT evidence for acute infarction or hemorrhage. Vascular: No hyperdense vessel or unexpected calcification. Skull: Normal. Negative for fracture or focal lesion. Sinuses/Orbits: No acute finding. Other: None IMPRESSION: 1.  No evidence for acute intracranial abnormality. 2. Mild atrophy. Electronically Signed   By: Nolon Nations West.   On: 02/21/2017 16:40     Management plans discussed with the patient, family and they are in agreement.  CODE STATUS:     Code Status Orders        Start     Ordered   02/21/17 2007  Full code  Continuous     02/21/17 2007    Code Status History    Date Active Date Inactive Code Status Order ID Comments User Context   07/20/2015 12:45 PM 07/22/2015 10:17 PM Full Code [id number]  Nicholes Mango, MD Inpatient   02/08/2015 11:31 PM 02/11/2015  7:43 PM Full Code [id number]  Hower, Aaron Mose, MD ED      TOTAL TIME TAKING CARE OF THIS PATIENT: *40* minutes.    Icie Kuznicki West on 02/22/2017 at 10:58 AM  Between 7am to 6pm - Pager - [phone] After 6pm go to [email or web address] - password [email or web address] [facility]  Office  [phone]  CC: Primary care physician; Madelyn Brunner, MD

## 2017-02-22 NOTE — Progress Notes (Signed)
Inpatient Diabetes Program Recommendations  AACE/ADA: New Consensus Statement on Inpatient Glycemic Control (2015)  Target Ranges:  Prepandial:   less than 140 mg/dL      Peak postprandial:   less than 180 mg/dL (1-2 hours)      Critically ill patients:  140 - 180 mg/dL   Lab Results  Component Value Date   GLUCAP 280 (H) 02/22/2017   HGBA1C 7.2 (H) 07/21/2015    Review of Glycemic Control  Results for Corey West, Corey West (MRN [id number]) as of 02/22/2017 08:50  Ref. Range 02/21/2017 12:57 02/21/2017 16:46 02/21/2017 19:29 02/21/2017 19:52 02/22/2017 07:32  Glucose-Capillary Latest Ref Range: 65 - 99 mg/dL 366 (H) 347 (H) 309 (H) 284 (H) 280 (H)    Diabetes history: Type 2 Outpatient Diabetes medications: Novolog 70/30 84 units qam, Novolog 70/30 62 units qpm Current orders for Inpatient glycemic control: Novolog 0-9 units tid, Novolog 70/30 50 units bid (started today)  Inpatient Diabetes Program Recommendations:  Consider changing insulin to Levemir 35 units bid and Novolog 10 units tid- hold if patient eats less than 50% (d/c Novolog 70/30).  This will allow [place] to independently adjust basal and bolus doses.  Continue Novolog 0-9 units tid.  Add Novolog 0-5 units qhs.   Gentry Fitz, RN, BA, MHA, CDE Diabetes Coordinator Inpatient Diabetes Program  [phone] (Team Pager) [phone] ([place]) 02/22/2017 8:59 AM

## 2017-02-22 NOTE — Progress Notes (Signed)
Patient is stable and ready for discharge. Discharge paperwork reviewed with patient including new medications and medication changes. Patient verbalized understanding. Patient's niece to transport home.

## 2017-02-22 NOTE — Care Management (Signed)
Spoke with Corey West at the bedside. States he would like to go to physical therapy on an outpatient basis here at [facility]. Referral signed per Dr. Fritzi Mandes and faxed down to the rehabilitation department Discharge to home today per Dr. Reginia Forts RN MSN CCM Care Management [phone]

## 2017-02-22 NOTE — Progress Notes (Signed)
ANTIBIOTIC CONSULT NOTE - INITIAL  Pharmacy Consult for Vancomycin and Zosyn  Indication: Cellulitis of diabetic foot  No Known Allergies  Patient Measurements: Height: 5\' 10"  (177.8 cm) Weight: (!) 304 lb 14.4 oz (138.3 kg) IBW/kg (Calculated) : 73 Adjusted Body Weight:   Vital Signs: Temp: 98.3 F (36.8 C) (05/25 0430) Temp Source: Oral (05/24 1944) BP: 129/51 (05/25 0430) Pulse Rate: 64 (05/25 0430) Intake/Output from previous day: 05/24 0701 - 05/25 0700 In: 2050 [IV Piggyback:2050] Out: 250 [Urine:250] Intake/Output from this shift: No intake/output data recorded.  Labs:  Recent Labs  02/21/17 1023 02/22/17 0359  WBC 13.3* 7.3  HGB 10.4* 9.0*  PLT 123* 87*  CREATININE 2.16* 1.91*   Estimated Creatinine Clearance: 54 mL/min (A) (by C-G formula based on SCr of 1.91 mg/dL (H)). No results for input(s): VANCOTROUGH, VANCOPEAK, VANCORANDOM, GENTTROUGH, GENTPEAK, GENTRANDOM, TOBRATROUGH, TOBRAPEAK, TOBRARND, AMIKACINPEAK, AMIKACINTROU, AMIKACIN in the last 72 hours.   Microbiology: Recent Results (from the past 720 hour(s))  MRSA PCR Screening     Status: None   Collection Time: 02/21/17 10:57 PM  Result Value Ref Range Status   MRSA by PCR NEGATIVE NEGATIVE Final    Comment:        The GeneXpert MRSA Assay (FDA approved for NASAL specimens only), is one component of a comprehensive MRSA colonization surveillance program. It is not intended to diagnose MRSA infection nor to guide or monitor treatment for MRSA infections.     Medical History: Past Medical History:  Diagnosis Date  . Anemia   . Anxiety   . Arthritis   . Barrett's esophagus   . Cirrhosis ([place])   . Depression   . Diabetes mellitus without complication ([place])   . GERD (gastroesophageal reflux disease)   . History of hiatal hernia   . Hypertension   . Sleep apnea     Medications:    Assessment: Pharmacy consulted to dose and monitor Vancomycin and Zosyn in this 66 year old male  for cellulitis.   Goal of Therapy:  Vancomycin trough level 15-20 mcg/ml  Plan:  Patients baseline Scr: 1.9-2.1, Scr this morning is 1.91.  Will adjust Vanc dose to Vancomycin 1250mg  IV every 18 hours. Calculated trough at Css is 15. Trough level ordered prior to 4th dose. Will continue to monitor renal function and adjust dose as needed.   Will Adjust Zosyn dose to Zosyn 3.375 IV every 8 hours.   Pernell Dupre, PharmD, BCPS Clinical Pharmacist 02/22/2017 7:31 AM

## 2017-02-22 NOTE — Evaluation (Signed)
Physical Therapy Evaluation Patient Details Name: Corey West MRN: [id number] DOB: 11/18/1950 Today's Date: 02/22/2017   History of Present Illness  Pt is a 66 y.o. male presenting to hospital s/p fall and stuck between couch and recliner; pt also with generalized weakness.  Pt admitted to hospital with L LE cellulitis, uncontrolled diabetes, and generalized weakness.  PMH includes htn, h/o hiatal hernia, Barrett's esophagus, and s/p EGD 02/06/17.  Clinical Impression  Prior to hospital admission, pt was ambulating modified independently using SPC and working part time.  Pt lives alone in a condo with 4 steps to enter and using R railing.  Currently pt is modified independent with bed mobility, CGA to SBA with transfers and ambulation with SPC, and CGA with navigating stairs.  After 40 feet ambulating with SPC pt initially catching L foot on floor tripping but pt able to self correct (pt initially CGA for safety when ambulating but progressed to SBA); increased SOB noted with ambulation and also with stairs (O2 94% or greater on room air during session).  Pt demonstrating generalized weakness during session and increased effort for functional mobility.  Pt would benefit from skilled PT to address noted impairments and functional limitations (see below for any additional details).  Upon hospital discharge, recommend pt discharge to home with OP PT.    Follow Up Recommendations Outpatient PT    Equipment Recommendations  Cane (Pt reports having cane at home already)    Recommendations for Other Services       Precautions / Restrictions Precautions Precautions: Fall Restrictions Weight Bearing Restrictions: No      Mobility  Bed Mobility Overal bed mobility: Modified Independent             General bed mobility comments: Supine to sit with HOB elevated (pt reports he usually sleeps in recliner chair).  Transfers Overall transfer level: Needs assistance Equipment used: Straight  cane Transfers: Sit to/from [employer] Transfers Sit to Stand: Min guard;Supervision Stand pivot transfers: Min guard;Supervision       General transfer comment: initially CGA for safety with transfers but progressed to SBA; no loss of balance with transfers noted  Ambulation/Gait Ambulation/Gait assistance: Min guard Ambulation Distance (Feet): 180 Feet Assistive device: Straight cane Gait Pattern/deviations: Step-through pattern;Wide base of support Gait velocity: mildly decreased   General Gait Details: after 40 feet ambulating pt initially catching L foot on floor tripping but pt able to self correct (pt initially CGA for safety but progressed to SBA); SOB noted with distance   Stairs Stairs: Yes Stairs assistance: Min guard Stair Management: One rail Right;Step to pattern;Forwards Number of Stairs: 6 General stair comments: steady without loss of balance although increased effort noted to perform stairs  Wheelchair Mobility    Modified Rankin (Stroke Patients Only)       Balance Overall balance assessment: History of Falls;Needs assistance Sitting-balance support: No upper extremity supported;Feet supported Sitting balance-Leahy Scale: Normal Sitting balance - Comments: sitting reaching outside BOS and putting on his shoes   Standing balance support: No upper extremity supported Standing balance-Leahy Scale: Good Standing balance comment: standing urinating at toilet with wide BOS noted                             Pertinent Vitals/Pain Pain Assessment: No/denies pain  HR WFL during session.    Home Living Family/patient expects to be discharged to:: Private residence Living Arrangements: Alone   Type of Home:  (  Condo) Home Access: Stairs to enter Entrance Stairs-Rails: Psychiatric nurse of Steps: 4 (uses R railing) Home Layout: One level Home Equipment: Walker - 2 wheels;Cane - single point      Prior Function Level of  Independence: Independent with assistive device(s)         Comments: Pt uses SPC.  Pt reports only fall in last 6 months was when he fell between couch and recliner recently.  Pt works part time 4 hours a day for 4 days a week.  (+) driving.  Per ED notes, pt with poor sanitation in home.     Hand Dominance        Extremity/Trunk Assessment   Upper Extremity Assessment Upper Extremity Assessment: Generalized weakness    Lower Extremity Assessment Lower Extremity Assessment: Generalized weakness       Communication   Communication: No difficulties  Cognition Arousal/Alertness: Awake/alert Behavior During Therapy: WFL for tasks assessed/performed Overall Cognitive Status: Within Functional Limits for tasks assessed                                        General Comments General comments (skin integrity, edema, etc.): Pt resting in bed upon PT entry.  New briefs required to be donned d/t urinary incontinence.  Nursing cleared pt for participation in physical therapy.  Pt agreeable to PT session.    Exercises  Ambulation and stairs.   Assessment/Plan    PT Assessment Patient needs continued PT services  PT Problem List Decreased strength;Decreased balance;Decreased mobility;Decreased activity tolerance       PT Treatment Interventions DME instruction;Gait training;Stair training;Functional mobility training;Therapeutic activities;Therapeutic exercise;Balance training;Patient/family education    PT Goals (Current goals can be found in the Care Plan section)  Acute Rehab PT Goals Patient Stated Goal: to go home PT Goal Formulation: With patient Time For Goal Achievement: 03/08/17 Potential to Achieve Goals: Good    Frequency Min 2X/week   Barriers to discharge        Co-evaluation               AM-PAC PT "6 Clicks" Daily Activity  Outcome Measure Difficulty turning over in bed (including adjusting bedclothes, sheets and blankets)?: A  Little Difficulty moving from lying on back to sitting on the side of the bed? : A Little Difficulty sitting down on and standing up from a chair with arms (e.g., wheelchair, bedside commode, etc,.)?: A Little Help needed moving to and from a bed to chair (including a wheelchair)?: A Little Help needed walking in hospital room?: A Little Help needed climbing 3-5 steps with a railing? : A Little 6 Click Score: 18    End of Session Equipment Utilized During Treatment: Gait belt Activity Tolerance: Patient tolerated treatment well Patient left: in chair;with call bell/phone within reach;with chair alarm set Nurse Communication: Mobility status;Precautions PT Visit Diagnosis: Difficulty in walking, not elsewhere classified (R26.2);History of falling (Z91.81);Muscle weakness (generalized) (M62.81)    Time: 9323-5573 PT Time Calculation (min) (ACUTE ONLY): 41 min   Charges:   PT Evaluation $PT Eval Low Complexity: 1 Procedure PT Treatments $Therapeutic Activity: 8-22 mins   PT G CodesLeitha Bleak, PT 02/22/17, 12:21 PM [phone]

## 2017-03-06 ENCOUNTER — Encounter: Payer: Self-pay | Admitting: Physical Therapy

## 2017-03-06 ENCOUNTER — Ambulatory Visit: Payer: Medicare Other | Attending: Internal Medicine | Admitting: Physical Therapy

## 2017-03-06 DIAGNOSIS — I890 Lymphedema, not elsewhere classified: Secondary | ICD-10-CM | POA: Insufficient documentation

## 2017-03-06 DIAGNOSIS — R262 Difficulty in walking, not elsewhere classified: Secondary | ICD-10-CM | POA: Diagnosis present

## 2017-03-06 DIAGNOSIS — M6281 Muscle weakness (generalized): Secondary | ICD-10-CM | POA: Insufficient documentation

## 2017-03-06 NOTE — Therapy (Signed)
[place] MAIN [facility] SERVICES [address] [place], [place], [address] Phone: [phone]   Fax:  [phone]  Physical Therapy Evaluation  Patient Details  Name: Corey West MRN: [id number] Date of Birth: 03-26-1951 Referring Provider: Dr. Lisette Grinder, PCP  Encounter Date: 03/06/2017      PT End of Session - 03/06/17 1734    Visit Number 1   Number of Visits 17   Date for PT Re-Evaluation 05/01/17   Authorization Type gcode 1   Authorization Time Period 10   PT Start Time 6962   PT Stop Time 1605   PT Time Calculation (min) 50 min   Activity Tolerance Patient tolerated treatment well;No increased pain   Behavior During Therapy WFL for tasks assessed/performed      Past Medical History:  Diagnosis Date  . Anemia   . Anxiety    controlled;   . Arthritis   . Barrett's esophagus   . Cirrhosis ([place])   . Depression    controlled;   Marland Kitchen Diabetes mellitus without complication ([place])    not controlled, taking insulin but sugar continues to run high;   Marland Kitchen GERD (gastroesophageal reflux disease)   . History of hiatal hernia   . Hypertension    controlled well;   Marland Kitchen Sleep apnea     Past Surgical History:  Procedure Laterality Date  . ESOPHAGOGASTRODUODENOSCOPY N/A 02/09/2015   Procedure: ESOPHAGOGASTRODUODENOSCOPY (EGD);  Surgeon: Manya Silvas, MD;  Location: [facility] ENDOSCOPY;  Service: Endoscopy;  Laterality: N/A;  . ESOPHAGOGASTRODUODENOSCOPY N/A 07/22/2015   Procedure: ESOPHAGOGASTRODUODENOSCOPY (EGD);  Surgeon: Manya Silvas, MD;  Location: [facility] ENDOSCOPY;  Service: Endoscopy;  Laterality: N/A;  . ESOPHAGOGASTRODUODENOSCOPY (EGD) WITH PROPOFOL N/A 07/20/2015   Procedure: ESOPHAGOGASTRODUODENOSCOPY (EGD) WITH PROPOFOL;  Surgeon: Manya Silvas, MD;  Location: [facility] ENDOSCOPY;  Service: Endoscopy;  Laterality: N/A;  . ESOPHAGOGASTRODUODENOSCOPY (EGD) WITH PROPOFOL N/A 09/16/2015   Procedure: ESOPHAGOGASTRODUODENOSCOPY (EGD) WITH  PROPOFOL;  Surgeon: Manya Silvas, MD;  Location: [facility] ENDOSCOPY;  Service: Endoscopy;  Laterality: N/A;  . ESOPHAGOGASTRODUODENOSCOPY (EGD) WITH PROPOFOL N/A 03/16/2016   Procedure: ESOPHAGOGASTRODUODENOSCOPY (EGD) WITH PROPOFOL;  Surgeon: Manya Silvas, MD;  Location: [facility] ENDOSCOPY;  Service: Endoscopy;  Laterality: N/A;  . ESOPHAGOGASTRODUODENOSCOPY (EGD) WITH PROPOFOL N/A 09/14/2016   Procedure: ESOPHAGOGASTRODUODENOSCOPY (EGD) WITH PROPOFOL;  Surgeon: Manya Silvas, MD;  Location: [facility] ENDOSCOPY;  Service: Endoscopy;  Laterality: N/A;  . ESOPHAGOGASTRODUODENOSCOPY (EGD) WITH PROPOFOL N/A 11/05/2016   Procedure: ESOPHAGOGASTRODUODENOSCOPY (EGD) WITH PROPOFOL;  Surgeon: Manya Silvas, MD;  Location: [facility] ENDOSCOPY;  Service: Endoscopy;  Laterality: N/A;  . ESOPHAGOGASTRODUODENOSCOPY (EGD) WITH PROPOFOL N/A 02/06/2017   Procedure: ESOPHAGOGASTRODUODENOSCOPY (EGD) WITH PROPOFOL;  Surgeon: Manya Silvas, MD;  Location: [facility] ENDOSCOPY;  Service: Endoscopy;  Laterality: N/A;  . TONSILLECTOMY    . TONSILLECTOMY AND ADENOIDECTOMY    . ULNAR NERVE TRANSPOSITION    . UVULOPALATOPHARYNGOPLASTY      There were no vitals filed for this visit.       Subjective Assessment - 03/06/17 1524    Subjective 66 yo Male reports impaired balance and LE weakness. He was recently hospitalizaed for dehydration and infection/cellulitis in BLE. He has been taking antiobiotics which has helped. He also has chronic low back pain which he reports has been aggravated from his co-morbidities. Patient presents to therapy with SPC. He walks with the cane all the time. He reports having numbness/tingling in plantar surface of feet especially at night; He does wake up often  at night to use the bathroom due to fluid pill;    Pertinent History recent hospitalization due to dehydration and cellulitis; has LE weakness over past few years; Patient reports 1 recent fall; Co-morbidities include cirrosis, obesity, DM (not  controlled, taking insulin, runs >200 frequently); Sleep apnea, not wearing CPAP consistently; has numbness in bottom of feet especially at night;    How long can you sit comfortably? >1 hour, no difficulty   How long can you stand comfortably? about 20-30 min;    How long can you walk comfortably? no difficult, does stumble occasionally;    Patient Stated Goals strengthen legs, improve balance; keep using cane for security; be able to get up from floor by himself;    Currently in Pain? No/denies   Pain Score 0-No pain            [facility] PT Assessment - 03/06/17 0001      Assessment   Medical Diagnosis LE weakness   Referring Provider Dr. Lisette Grinder, PCP   Onset Date/Surgical Date --  last couple of years   Hand Dominance Right   Next MD Visit August 2018   Prior Therapy Denies any PT for this condition;      Precautions   Precautions Fall     Restrictions   Weight Bearing Restrictions No     Balance Screen   Has the patient fallen in the past 6 months Yes   How many times? 1  2 weeks ago, sleeping in recliner, got up and legs gave out   Has the patient had a decrease in activity level because of a fear of falling?  Yes   Is the patient reluctant to leave their home because of a fear of falling?  No     Home Environment   Additional Comments Lives alone, lives in [place], single level, has 3 steps to enter, negotiates steps one step at a time, mod I;      Prior Function   Level of Independence Requires assistive device for independence  uses cane over last 3 years   Vocation --  semi-retired   Vocation Requirements mostly desk work, Corporate treasurer   Leisure spends most of time at home, does enjoy gong to dinner, watches some sports but not often;      Cognition   Overall Cognitive Status Within Functional Limits for tasks assessed   Memory Appears intact     Observation/Other Assessments   Observations very nice gentleman     Observation/Other  Assessments-Edema    Edema --  pt exhibits increased edema in BLE, unable to lift pant leg     Sensation   Light Touch Appears Intact  grossly intact during today's session;    Proprioception Appears Intact   Additional Comments does have numbness in plantar surface of feet at night; less numbness in morning and during day;      Coordination   Gross Motor Movements are Fluid and Coordinated Yes   Fine Motor Movements are Fluid and Coordinated Yes     Posture/Postural Control   Posture Comments exhibits moderate slumped posture with decreased lumbar lordosis and forward rounded shoulders, increased hip abduction with toes turned out;      AROM   Overall AROM Comments BUE and BLE are WFL; able to lift legs against gravity with mild difficulty     Strength   Overall Strength Comments not formally assessed as patient has tenderness from LE swelling; appears at least 3/5 as able  to lift against gravity;      Palpation   Palpation comment reports moderate tenderness to palpation along BLE due to increased swelling;      Transfers   Comments requires both hands to push up on arm rests for getting up from chair, requiring increased time; reports increased difficulty due to weakness;      Ambulation/Gait   Gait Comments ambulates with SPC with wide base of support, short shuffled steps, flexed posture, slower gait speed;      6 minute walk test results    Endurance additional comments fair, does fatigue with prolonged standing/walking, no shortness of breath noted;      Standardized Balance Assessment   10 Meter Walk 0.68 m/s with SPC (limited community ambulator, increased risk for falls)     High Level Balance   High Level Balance Comments able to stand feet apart, eyes closed no loss of balance; able to turn 360 degrees with short shuffled steps, supervision; tandem stance 5 sec with HHA; unable to hold SLS;             Objective measurements completed on examination: See  above findings.                  PT Education - 03/06/17 1733    Education provided Yes   Education Details recommendations, possible lymphedema referral; Plan of care;    Person(s) Educated Patient   Methods Explanation   Comprehension Verbalized understanding             PT Long Term Goals - 03/06/17 1746      PT LONG TERM GOAL #1   Title Patient will be independent in home exercise program to improve strength/mobility for better functional independence with ADLs.   Time 8   Period Weeks   Status New     PT LONG TERM GOAL #2   Title Patient (> 71 years old) will complete five times sit to stand test in < 15 seconds indicating an increased LE strength and improved balance.   Time 8   Period Weeks   Status New     PT LONG TERM GOAL #3   Title Patient will increase 10 meter walk test to >1.26m/s as to improve gait speed for better community ambulation and to reduce fall risk.   Time 8   Period Weeks   Status New     PT LONG TERM GOAL #4   Title Patient will demonstrate an improved Berg Balance Score of >50/56 as to demonstrate improved balance with ADLs such as sitting/standing and transfer balance and reduced fall risk.    Time 8   Period Weeks   Status New     PT LONG TERM GOAL #5   Title Patient will increase BLE gross strength to 4+/5 as to improve functional strength for independent gait, increased standing tolerance and increased ADL ability.   Time 8   Period Weeks   Status New                Plan - 03/06/17 1735    Clinical Impression Statement 66 yo Male presents to therapy with increased swelling in BLE from recent infection/cellulitis in BLE. Patient was recently hospitalized with dehydration and LE infection. He reports increased weakness in LE that has been progressing past few days. He reports increased swelling today in LE and had increased tenderness in BLE. Unable to formally test LE strength due to swelling and pain. Patient does  have  difficulty with sit<>stand transfers requiring 2 HHA to push from chair. He ambulates with SPC, supervision exhibiting short shuffled steps with wide base of support and slower gait speed. Patient tested at an increased risk for falls. He would benefit from skilled PT intervention to improve strength, balance and gait safety; Patient may benefit from referral for lymphedema therapy to reduce swelling in BLE;    History and Personal Factors relevant to plan of care: lives alone, recent fall, multiple co-morbidities including uncontrolled DM, increased edema in BLE, recent hospitalization from dehydration;    Clinical Presentation Evolving   Clinical Presentation due to: increased edema in BLE which is variable depending on fluid retention, recent infection in LE which affects swelling, high fall risk;    Clinical Decision Making Moderate   Rehab Potential Fair   Clinical Impairments Affecting Rehab Potential negative: swelling in BLE with pain limiting exercise tolerance;    PT Frequency 2x / week   PT Duration 8 weeks   PT Treatment/Interventions Cryotherapy;Moist Heat;Neuromuscular re-education;Balance training;Therapeutic exercise;Therapeutic activities;Functional mobility training;Stair training;Gait training;DME Instruction;Patient/family education;Manual techniques;Energy conservation   PT Next Visit Plan assess strength if tolerated, HEP   PT Home Exercise Plan will address next session;    Consulted and Agree with Plan of Care Patient      Patient will benefit from skilled therapeutic intervention in order to improve the following deficits and impairments:  Decreased endurance, Obesity, Decreased activity tolerance, Decreased strength, Difficulty walking, Decreased mobility, Decreased balance, Decreased safety awareness, Postural dysfunction  Visit Diagnosis: Muscle weakness (generalized) - Plan: PT plan of care cert/re-cert  Difficulty in walking, not elsewhere classified - Plan: PT  plan of care cert/re-cert      G-Codes - 66/59/93 1748    Functional Assessment Tool Used (Outpatient Only) 10 meter walk, clinical judgement   Functional Limitation Mobility: Walking and moving around   Mobility: Walking and Moving Around Current Status ([id number]) At least 40 percent but less than 60 percent impaired, limited or restricted   Mobility: Walking and Moving Around Goal Status [phone]) At least 20 percent but less than 40 percent impaired, limited or restricted       Problem List Patient Active Problem List   Diagnosis Date Noted  . Cellulitis in diabetic foot ([place]) 02/21/2017  . Uncontrolled diabetes mellitus ([place]) 02/21/2017  . Cellulitis 02/21/2017  . GI bleed 07/20/2015  . Hematemesis 02/08/2015  . Cirrhosis ([place]) 02/08/2015  . Esophageal varices ([place]) 02/08/2015  . Type 2 diabetes mellitus ([place]) 02/08/2015  . Gastroesophageal reflux disease 02/08/2015  . Hyperlipidemia 02/08/2015  . Anemia 12/04/2013  . Thrombocytopenia ([place]) 12/04/2013  . Splenomegaly 11/04/2013    Ranita Stjulien PT, DPT 03/06/2017, 5:50 PM  [place] MAIN [facility] SERVICES [address] [place], [place], [address] Phone: [phone]   Fax:  [phone]  Name: Corey West MRN: [id number] Date of Birth: 1950-11-26

## 2017-03-12 ENCOUNTER — Encounter: Payer: Self-pay | Admitting: Physical Therapy

## 2017-03-12 ENCOUNTER — Ambulatory Visit: Payer: Self-pay | Attending: Internal Medicine | Admitting: Occupational Therapy

## 2017-03-12 ENCOUNTER — Ambulatory Visit: Payer: Medicare Other | Admitting: Physical Therapy

## 2017-03-12 DIAGNOSIS — I890 Lymphedema, not elsewhere classified: Secondary | ICD-10-CM | POA: Insufficient documentation

## 2017-03-12 DIAGNOSIS — M6281 Muscle weakness (generalized): Secondary | ICD-10-CM

## 2017-03-12 DIAGNOSIS — R262 Difficulty in walking, not elsewhere classified: Secondary | ICD-10-CM

## 2017-03-12 NOTE — Therapy (Signed)
[place] MAIN [facility] SERVICES [address] [place], [place], [address] Phone: [phone]   Fax:  [phone]  Physical Therapy Treatment  Patient Details  Name: Corey West MRN: [id number] Date of Birth: [birth year] Referring Provider: Dr. Lisette Grinder, PCP  Encounter Date: 03/12/2017      PT End of Session - 03/12/17 1428    Visit Number 2   Number of Visits 17   Date for PT Re-Evaluation 05/01/17   Authorization Type gcode 2   Authorization Time Period 10   PT Start Time 1856   PT Stop Time 1503   PT Time Calculation (min) 43 min   Activity Tolerance Patient tolerated treatment well;No increased pain   Behavior During Therapy WFL for tasks assessed/performed      Past Medical History:  Diagnosis Date  . Anemia   . Anxiety    controlled;   . Arthritis   . Barrett's esophagus   . Cirrhosis ([place])   . Depression    controlled;   Marland Kitchen Diabetes mellitus without complication ([place])    not controlled, taking insulin but sugar continues to run high;   Marland Kitchen GERD (gastroesophageal reflux disease)   . History of hiatal hernia   . Hypertension    controlled well;   Marland Kitchen Sleep apnea     Past Surgical History:  Procedure Laterality Date  . ESOPHAGOGASTRODUODENOSCOPY N/A 02/09/2015   Procedure: ESOPHAGOGASTRODUODENOSCOPY (EGD);  Surgeon: Manya Silvas, MD;  Location: [facility] ENDOSCOPY;  Service: Endoscopy;  Laterality: N/A;  . ESOPHAGOGASTRODUODENOSCOPY N/A 07/22/2015   Procedure: ESOPHAGOGASTRODUODENOSCOPY (EGD);  Surgeon: Manya Silvas, MD;  Location: [facility] ENDOSCOPY;  Service: Endoscopy;  Laterality: N/A;  . ESOPHAGOGASTRODUODENOSCOPY (EGD) WITH PROPOFOL N/A 07/20/2015   Procedure: ESOPHAGOGASTRODUODENOSCOPY (EGD) WITH PROPOFOL;  Surgeon: Manya Silvas, MD;  Location: [facility] ENDOSCOPY;  Service: Endoscopy;  Laterality: N/A;  . ESOPHAGOGASTRODUODENOSCOPY (EGD) WITH PROPOFOL N/A 09/16/2015   Procedure: ESOPHAGOGASTRODUODENOSCOPY (EGD) WITH  PROPOFOL;  Surgeon: Manya Silvas, MD;  Location: [facility] ENDOSCOPY;  Service: Endoscopy;  Laterality: N/A;  . ESOPHAGOGASTRODUODENOSCOPY (EGD) WITH PROPOFOL N/A 03/16/2016   Procedure: ESOPHAGOGASTRODUODENOSCOPY (EGD) WITH PROPOFOL;  Surgeon: Manya Silvas, MD;  Location: [facility] ENDOSCOPY;  Service: Endoscopy;  Laterality: N/A;  . ESOPHAGOGASTRODUODENOSCOPY (EGD) WITH PROPOFOL N/A 09/14/2016   Procedure: ESOPHAGOGASTRODUODENOSCOPY (EGD) WITH PROPOFOL;  Surgeon: Manya Silvas, MD;  Location: [facility] ENDOSCOPY;  Service: Endoscopy;  Laterality: N/A;  . ESOPHAGOGASTRODUODENOSCOPY (EGD) WITH PROPOFOL N/A 11/05/2016   Procedure: ESOPHAGOGASTRODUODENOSCOPY (EGD) WITH PROPOFOL;  Surgeon: Manya Silvas, MD;  Location: [facility] ENDOSCOPY;  Service: Endoscopy;  Laterality: N/A;  . ESOPHAGOGASTRODUODENOSCOPY (EGD) WITH PROPOFOL N/A 02/06/2017   Procedure: ESOPHAGOGASTRODUODENOSCOPY (EGD) WITH PROPOFOL;  Surgeon: Manya Silvas, MD;  Location: [facility] ENDOSCOPY;  Service: Endoscopy;  Laterality: N/A;  . TONSILLECTOMY    . TONSILLECTOMY AND ADENOIDECTOMY    . ULNAR NERVE TRANSPOSITION    . UVULOPALATOPHARYNGOPLASTY      There were no vitals filed for this visit.      Subjective Assessment - 03/12/17 1431    Subjective Pt is having less swelling in LEs, but continues to have LE tenderness to touch. He denies pain at rest.    Pertinent History recent hospitalization due to dehydration and cellulitis; has LE weakness over past few years; Patient reports 1 recent fall; Co-morbidities include cirrosis, obesity, DM (not controlled, taking insulin, runs >200 frequently); Sleep apnea, not wearing CPAP consistently; has numbness in bottom of feet especially at night;    How  long can you sit comfortably? >1 hour, no difficulty   How long can you stand comfortably? about 20-30 min;    How long can you walk comfortably? no difficult, does stumble occasionally;    Patient Stated Goals strengthen legs, improve balance;  keep using cane for security; be able to get up from floor by himself;    Currently in Pain? No/denies     Treatment: Supine: Breathing exercises: Pt transferred standing >supine with max effort and close supervision due to safety concerns;   Pt instructed in deep breathing exercise x 10 with cues to avoid chest breathing to encourage proper thoracic mobility. Posterior pelvic tilt x 10 with verbal and tactile cues for pelvic rotation and flattening of lumbar lordosis with posterior tilt.   LE strengthening: Heel slides x 10 each for hamstring strengthening; pt reports mild discomfort due to LE edema. Pt with max effort requiring removal of shoes to decrease friction on mat.  Simultaneous hip abduction x 10 B, Ankle pumps x 10 B with cues for increased muscle activation to increase muscle vein pump effect. Ankle inversion eversion x 10 each; pt requiring mod verbal/tactile cues and minA for proper technique and isolation of desired movement; pt had coninued difficulty isolating motion.   Pt transferred supine to sitting requiring minA x 2 and verbal cues to move LEs off edge of mat table; pt providing max effort with transfer due to weakness and limited ROM.  Sitting: Pt instructed in posterior shoulder role to promote increased shoulder mobility and decrease rounded shoulder/forward head posture.   Pt had difficulty requiring max effort with basic AROM exercises, but with verbal/tactile cueing and demonstration pt was able to return demonstration showing adequate understanding of his HEP.                                PT Education - 03/12/17 1429    Education provided Yes   Education Details Ther-ex, positional tolerance,    Person(s) Educated Patient   Methods Explanation;Demonstration;Verbal cues;Tactile cues   Comprehension Verbalized understanding;Returned demonstration;Verbal cues required;Tactile cues required             PT Long Term Goals -  03/06/17 1746      PT LONG TERM GOAL #1   Title Patient will be independent in home exercise program to improve strength/mobility for better functional independence with ADLs.   Time 8   Period Weeks   Status New     PT LONG TERM GOAL #2   Title Patient (> 51 years old) will complete five times sit to stand test in < 15 seconds indicating an increased LE strength and improved balance.   Time 8   Period Weeks   Status New     PT LONG TERM GOAL #3   Title Patient will increase 10 meter walk test to >1.58m/s as to improve gait speed for better community ambulation and to reduce fall risk.   Time 8   Period Weeks   Status New     PT LONG TERM GOAL #4   Title Patient will demonstrate an improved Berg Balance Score of >50/56 as to demonstrate improved balance with ADLs such as sitting/standing and transfer balance and reduced fall risk.    Time 8   Period Weeks   Status New     PT LONG TERM GOAL #5   Title Patient will increase BLE gross strength to 4+/5 as to  improve functional strength for independent gait, increased standing tolerance and increased ADL ability.   Time 8   Period Weeks   Status New               Plan - 03/12/17 1514    Clinical Impression Statement Pt instructed in breathing and LE exercises for improved functional mobility. He continues to be limited by discomfort and limited ROM in LEs due to edema and general weakness/deconditioning. Pt will benefit from continued skilled therapy to address deficits and to maximize functional mobility.    Rehab Potential Fair   Clinical Impairments Affecting Rehab Potential negative: swelling in BLE with pain limiting exercise tolerance;    PT Frequency 2x / week   PT Duration 8 weeks   PT Treatment/Interventions Cryotherapy;Moist Heat;Neuromuscular re-education;Balance training;Therapeutic exercise;Therapeutic activities;Functional mobility training;Stair training;Gait training;DME Instruction;Patient/family  education;Manual techniques;Energy conservation   PT Next Visit Plan assess strength if tolerated, HEP   PT Home Exercise Plan will address next session;    Consulted and Agree with Plan of Care Patient      Patient will benefit from skilled therapeutic intervention in order to improve the following deficits and impairments:  Decreased endurance, Obesity, Decreased activity tolerance, Decreased strength, Difficulty walking, Decreased mobility, Decreased balance, Decreased safety awareness, Postural dysfunction  Visit Diagnosis: Muscle weakness (generalized)  Difficulty in walking, not elsewhere classified     Problem List Patient Active Problem List   Diagnosis Date Noted  . Cellulitis in diabetic foot ([place]) 02/21/2017  . Uncontrolled diabetes mellitus ([place]) 02/21/2017  . Cellulitis 02/21/2017  . GI bleed 07/20/2015  . Hematemesis 02/08/2015  . Cirrhosis ([place]) 02/08/2015  . Esophageal varices ([place]) 02/08/2015  . Type 2 diabetes mellitus ([place]) 02/08/2015  . Gastroesophageal reflux disease 02/08/2015  . Hyperlipidemia 02/08/2015  . Anemia 12/04/2013  . Thrombocytopenia ([place]) 12/04/2013  . Splenomegaly 11/04/2013   Harley Mccartney M Amisha Pospisil, SPT This entire session was performed under direct supervision and direction of a licensed therapist/therapist assistant . I have personally read, edited and approve of the note as written.  Trotter,Margaret PT, DPT 03/13/2017, 8:00 AM  [place] MAIN [facility] SERVICES [address] [place], [place], [address] Phone: [phone]   Fax:  [phone]  Name: Corey West MRN: [id number] Date of Birth: Mar 17, 1951

## 2017-03-12 NOTE — Therapy (Signed)
OT Lymphedema Screening Form   Time: in_1:30 PM_____     Time out_1:50 PM____   Complaint ___chronic, BLE leg swelling______________ Past Medical Hx:  ________________ Injury Date:_chronic, > 10 years_______________________________  Pain Scale: _not rated numerically_ __________ Patient's phone number:   Hx (this occurrence):  Ongoing, painful, BLE swelling below the knees without known precipitating event. Pt reports + family history for leg swelling in natural mother. Pt reports swelling and leg pain has worsened over the past 4-5 years. Pt recently hospitalized with LLE cellulitis. Pt has not undergone lymphedema treatment in the past. 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 He has not been able to tolerate compression garments and he is currently unable to don street shoes other than slip on loafers with backs flattened.    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 Pt will benefit skilled Occupational Therapy for  Intensive Phase Complete Decongestive Therapy to decrease and control swelling and to fit with day and HOS compression. Without skilled OT for CDT, further functional decline is expected, and the condition will progress as will increased risk of non-healing wounds, increased falls risk, and increased of recurrent infection.    Recommendations:    Comments: Intensive and Management Phase CDT 3 x weekly for 12 weeks Compression wrapping one leg at a time- knee length Fit with accessible compression for daytime and HOS Assistive device training Therapeutic exercise Skin care Pt edu for LE self care   [x]  Patient would benefit from an MD referral [x]  Patient would benefit from a full OT evaluation and treatment. []  No intervention recommended at this time  Andrey Spearman, MS, OTR/L, CLT-LANA.

## 2017-03-18 ENCOUNTER — Encounter: Payer: Self-pay | Admitting: Occupational Therapy

## 2017-03-18 ENCOUNTER — Ambulatory Visit: Payer: Medicare Other | Admitting: Physical Therapy

## 2017-03-18 ENCOUNTER — Ambulatory Visit: Payer: Medicare Other | Admitting: Occupational Therapy

## 2017-03-18 DIAGNOSIS — I890 Lymphedema, not elsewhere classified: Secondary | ICD-10-CM

## 2017-03-19 ENCOUNTER — Encounter: Payer: Self-pay | Admitting: Occupational Therapy

## 2017-03-19 ENCOUNTER — Ambulatory Visit: Payer: Medicare Other | Admitting: Occupational Therapy

## 2017-03-19 DIAGNOSIS — I890 Lymphedema, not elsewhere classified: Secondary | ICD-10-CM

## 2017-03-19 DIAGNOSIS — M6281 Muscle weakness (generalized): Secondary | ICD-10-CM | POA: Diagnosis not present

## 2017-03-19 NOTE — Patient Instructions (Signed)

## 2017-03-19 NOTE — Therapy (Signed)
Pt evaluation rescheduled due to personal problem.

## 2017-03-20 ENCOUNTER — Ambulatory Visit: Payer: Medicare Other | Admitting: Physical Therapy

## 2017-03-21 NOTE — Therapy (Signed)
[place] MAIN [facility] SERVICES [address] [place], [place], [address] Phone: [phone]   Fax:  [phone]  Occupational Therapy Evaluation  Patient Details  Name: Corey West MRN: [id number] Date of Birth: [birth year] Referring Provider: Lisette Grinder, MD (PCP)  Encounter Date: 03/19/2017    Past Medical History:  Diagnosis Date  . Anemia   . Anxiety    controlled;   . Arthritis   . Barrett's esophagus   . Cirrhosis ([place])   . Depression    controlled;   Marland Kitchen Diabetes mellitus without complication ([place])    not controlled, taking insulin but sugar continues to run high;   Marland Kitchen GERD (gastroesophageal reflux disease)   . History of hiatal hernia   . Hypertension    controlled well;   Marland Kitchen Sleep apnea     Past Surgical History:  Procedure Laterality Date  . ESOPHAGOGASTRODUODENOSCOPY N/A 02/09/2015   Procedure: ESOPHAGOGASTRODUODENOSCOPY (EGD);  Surgeon: Manya Silvas, MD;  Location: [facility] ENDOSCOPY;  Service: Endoscopy;  Laterality: N/A;  . ESOPHAGOGASTRODUODENOSCOPY N/A 07/22/2015   Procedure: ESOPHAGOGASTRODUODENOSCOPY (EGD);  Surgeon: Manya Silvas, MD;  Location: [facility] ENDOSCOPY;  Service: Endoscopy;  Laterality: N/A;  . ESOPHAGOGASTRODUODENOSCOPY (EGD) WITH PROPOFOL N/A 07/20/2015   Procedure: ESOPHAGOGASTRODUODENOSCOPY (EGD) WITH PROPOFOL;  Surgeon: Manya Silvas, MD;  Location: [facility] ENDOSCOPY;  Service: Endoscopy;  Laterality: N/A;  . ESOPHAGOGASTRODUODENOSCOPY (EGD) WITH PROPOFOL N/A 09/16/2015   Procedure: ESOPHAGOGASTRODUODENOSCOPY (EGD) WITH PROPOFOL;  Surgeon: Manya Silvas, MD;  Location: [facility] ENDOSCOPY;  Service: Endoscopy;  Laterality: N/A;  . ESOPHAGOGASTRODUODENOSCOPY (EGD) WITH PROPOFOL N/A 03/16/2016   Procedure: ESOPHAGOGASTRODUODENOSCOPY (EGD) WITH PROPOFOL;  Surgeon: Manya Silvas, MD;  Location: [facility] ENDOSCOPY;  Service: Endoscopy;  Laterality: N/A;  . ESOPHAGOGASTRODUODENOSCOPY (EGD) WITH PROPOFOL N/A  09/14/2016   Procedure: ESOPHAGOGASTRODUODENOSCOPY (EGD) WITH PROPOFOL;  Surgeon: Manya Silvas, MD;  Location: [facility] ENDOSCOPY;  Service: Endoscopy;  Laterality: N/A;  . ESOPHAGOGASTRODUODENOSCOPY (EGD) WITH PROPOFOL N/A 11/05/2016   Procedure: ESOPHAGOGASTRODUODENOSCOPY (EGD) WITH PROPOFOL;  Surgeon: Manya Silvas, MD;  Location: [facility] ENDOSCOPY;  Service: Endoscopy;  Laterality: N/A;  . ESOPHAGOGASTRODUODENOSCOPY (EGD) WITH PROPOFOL N/A 02/06/2017   Procedure: ESOPHAGOGASTRODUODENOSCOPY (EGD) WITH PROPOFOL;  Surgeon: Manya Silvas, MD;  Location: [facility] ENDOSCOPY;  Service: Endoscopy;  Laterality: N/A;  . TONSILLECTOMY    . TONSILLECTOMY AND ADENOIDECTOMY    . ULNAR NERVE TRANSPOSITION    . UVULOPALATOPHARYNGOPLASTY      There were no vitals filed for this visit.         [facility] OT Assessment - 03/21/17 0001      Assessment   Diagnosis Moderate, Stage III, BLE Lymphedema (Elephantiasis)   Referring Provider Lisette Grinder, MD (PCP)   Onset Date --  chronic, > 20 years   Prior Therapy no CDT, no compression     Precautions   Precautions Fall     Restrictions   Weight Bearing Restrictions No     Balance Screen   Has the patient fallen in the past 6 months Yes   How many times? 1   Has the patient had a decrease in activity level because of a fear of falling?  Yes     Home  Environment   Lives With Alone;Other (Comment)  in condo, single level, 3 steps to enter, one step at a time     Prior Function   Level of [place] for independence   Vocation Requirements mostly desk work, Corporate treasurer   Leisure  spends most of time at home, does enjoy gong to dinner, watches some sports but not often;      Cognition   Overall Cognitive Status Within Functional Limits for tasks assessed     Observation/Other Assessments   Observations BLE , stage III, lymphedema concentrated below the knees, 2/2 to obseity, venous insufficiency, dependent  positioning   Skin Integrity Dense hyperkeritosis on feet, Dry scales w/ scattered lymphatic cystes below knees, L>R. Skin red and dence to palpation. Strong positive Stemer sign bilaterally. Feet are quite dirty with markin at L ankle demarkation for recent cellulitis border. Numbness on plantar surface of B feet     AROM   Overall AROM Comments decreased AROM at feet,, ankles and knees 2/2 decreased tissue flexibility w/ induration and limb girth from swelling     Strength   Overall Strength Comments 3/5 legs against gravity bilaterally                  OT Treatments/Exercises (OP) - 03/21/17 0001      Transfers   Transfers Sit to Stand   Sit to Stand 6: Modified independent (Device/Increase time);With upper extremity assist;With armrests;Multiple attempts     ADLs   Overall ADLs unable to reach feet to bathe, inspect skin and perform skin and nail care. Unable to reach feet to don street shoes and socks. Difficulty walking and prolonged standing. Difficulty with instrumental adles requiring standing, walking and pushing against gravity, including housework, cooking, grocery shopping, home maintenance; decreased functional mobility and transfers due to heavy, swollen legs, difficulty w/ lower body dressing and fitting LB clothing 2/2 swelling   ADL Education Given Yes     Manual Therapy   Manual Therapy Edema management                    OT Long Term Goals - 03/19/17 1711      OT LONG TERM GOAL #1   Title Pt independent w/ lymphedema precautions and prevention principals and strategies to limit LE progression and infection risk.   Baseline dependent   Time 2   Period Weeks   Status New     OT LONG TERM GOAL #2   Title Lymphedema (LE) management/ self-care: Pt able to apply knee length, multi layered, gradient compression wraps with maximum caregiver assistance using proper techniques and assistive devices for repositioning within 2 weeks to achieve optimal  limb volume reductions bilaterally.   Baseline dependent   Time 2   Period Weeks   Status New     OT LONG TERM GOAL #4   Title Lymphedema (LE) management/ self-care:  Pt to achieve at least 10% limb volume reduction  bilaterally below the knees during Intensive Phase CDT to limit progression, to reduce leg pain, and to improve ADLs performance, and to facilitate safe functional mobility and ambulation.   Baseline dependent   Time 12   Period Weeks   Status New     OT LONG TERM GOAL #5   Title Lymphedema (LE) management/ self-care:  Pt to tolerate daily compression wraps, compression garments and/ or HOS devices in keeping w/ prescribed wear regime within 1 week of issue date of each to progress and retain clinical and functional gains and to limit LE progression.   Baseline dependent   Time 12   Period Weeks   Status New     Long Term Additional Goals   Additional Long Term Goals Yes     OT LONG TERM  GOAL #6   Title Lymphedema (LE) management/ self-care:  During Management Phase CDT Pt to sustain current limb volumes within 5%, and all other clinical gains achieved during OT treatment with needed level of caregiver assistance to limit LE progression, infection risk and further functional decline.   Baseline dependent   Time 6   Period Months   Status New             Patient will benefit from skilled therapeutic intervention in order to improve the following deficits and impairments:  Abnormal gait, Decreased endurance, Decreased skin integrity, Decreased knowledge of precautions, Impaired perceived functional ability, Decreased activity tolerance, Decreased knowledge of use of DME, Decreased strength, Impaired flexibility, Decreased balance, Decreased mobility, Difficulty walking, Impaired sensation, Obesity, Decreased range of motion, Increased edema, Pain  Visit Diagnosis: Lymphedema, not elsewhere classified - Plan: Ot plan of care cert/re-cert    Problem  List Patient Active Problem List   Diagnosis Date Noted  . Cellulitis in diabetic foot ([place]) 02/21/2017  . Uncontrolled diabetes mellitus ([place]) 02/21/2017  . Cellulitis 02/21/2017  . GI bleed 07/20/2015  . Hematemesis 02/08/2015  . Cirrhosis ([place]) 02/08/2015  . Esophageal varices ([place]) 02/08/2015  . Type 2 diabetes mellitus ([place]) 02/08/2015  . Gastroesophageal reflux disease 02/08/2015  . Hyperlipidemia 02/08/2015  . Anemia 12/04/2013  . Thrombocytopenia ([place]) 12/04/2013  . Splenomegaly 11/04/2013    Andrey Spearman, MS, OTR/L, [facility] 03/21/17 5:54 PM   [place] MAIN [facility] SERVICES [address] [place], [place], [address] Phone: [phone]   Fax:  [phone]  Name: Corey West MRN: [id number] Date of Birth: 1951/02/14

## 2017-03-25 ENCOUNTER — Ambulatory Visit: Payer: Medicare Other | Admitting: Physical Therapy

## 2017-03-27 ENCOUNTER — Ambulatory Visit: Payer: Medicare Other | Admitting: Physical Therapy

## 2017-04-02 ENCOUNTER — Ambulatory Visit: Payer: Medicare Other | Admitting: Physical Therapy

## 2017-04-04 ENCOUNTER — Ambulatory Visit: Payer: Medicare Other | Admitting: Physical Therapy

## 2017-04-08 ENCOUNTER — Ambulatory Visit: Payer: Medicare Other | Attending: Internal Medicine | Admitting: Occupational Therapy

## 2017-04-08 ENCOUNTER — Ambulatory Visit: Payer: Medicare Other | Admitting: Physical Therapy

## 2017-04-08 DIAGNOSIS — I890 Lymphedema, not elsewhere classified: Secondary | ICD-10-CM

## 2017-04-09 ENCOUNTER — Encounter
Admission: RE | Admit: 2017-04-09 | Discharge: 2017-04-09 | Disposition: A | Discharge status: Home / Self Care | Payer: Medicare Other | Source: Ambulatory Visit | Attending: Unknown Physician Specialty | Admitting: Unknown Physician Specialty

## 2017-04-09 DIAGNOSIS — D500 Iron deficiency anemia secondary to blood loss (chronic): Secondary | ICD-10-CM | POA: Diagnosis present

## 2017-04-09 DIAGNOSIS — Q2733 Arteriovenous malformation of digestive system vessel: Secondary | ICD-10-CM | POA: Diagnosis not present

## 2017-04-09 LAB — PREPARE RBC (CROSSMATCH)

## 2017-04-09 NOTE — Therapy (Signed)
[place] MAIN [facility] SERVICES [address] [place], [place], [address] Phone: [phone]   Fax:  [phone]  Occupational Therapy Treatment  Patient Details  Name: Corey West MRN: [id number] Date of Birth: 10/21/1950 Referring Provider: Lisette Grinder, MD (PCP)  Encounter Date: 04/08/2017      OT End of Session - 04/08/17 1154    Visit Number 2   Number of Visits 36   Date for OT Re-Evaluation 06/17/17   OT Start Time 0107   OT Stop Time 0215   OT Time Calculation (min) 68 min   Activity Tolerance Patient tolerated treatment well;Patient limited by pain;Treatment limited secondary to medical complications (Comment);Other (comment)  Pt limited by difficulty transferring wc <->plinth   Behavior During Therapy [facility] for tasks assessed/performed      Past Medical History:  Diagnosis Date  . Anemia   . Anxiety    controlled;   . Arthritis   . Barrett's esophagus   . Cirrhosis ([place])   . Depression    controlled;   Marland Kitchen Diabetes mellitus without complication ([place])    not controlled, taking insulin but sugar continues to run high;   Marland Kitchen GERD (gastroesophageal reflux disease)   . History of hiatal hernia   . Hypertension    controlled well;   Marland Kitchen Sleep apnea     Past Surgical History:  Procedure Laterality Date  . ESOPHAGOGASTRODUODENOSCOPY N/A 02/09/2015   Procedure: ESOPHAGOGASTRODUODENOSCOPY (EGD);  Surgeon: Manya Silvas, MD;  Location: [facility] ENDOSCOPY;  Service: Endoscopy;  Laterality: N/A;  . ESOPHAGOGASTRODUODENOSCOPY N/A 07/22/2015   Procedure: ESOPHAGOGASTRODUODENOSCOPY (EGD);  Surgeon: Manya Silvas, MD;  Location: [facility] ENDOSCOPY;  Service: Endoscopy;  Laterality: N/A;  . ESOPHAGOGASTRODUODENOSCOPY (EGD) WITH PROPOFOL N/A 07/20/2015   Procedure: ESOPHAGOGASTRODUODENOSCOPY (EGD) WITH PROPOFOL;  Surgeon: Manya Silvas, MD;  Location: [facility] ENDOSCOPY;  Service: Endoscopy;  Laterality: N/A;  . ESOPHAGOGASTRODUODENOSCOPY (EGD) WITH  PROPOFOL N/A 09/16/2015   Procedure: ESOPHAGOGASTRODUODENOSCOPY (EGD) WITH PROPOFOL;  Surgeon: Manya Silvas, MD;  Location: [facility] ENDOSCOPY;  Service: Endoscopy;  Laterality: N/A;  . ESOPHAGOGASTRODUODENOSCOPY (EGD) WITH PROPOFOL N/A 03/16/2016   Procedure: ESOPHAGOGASTRODUODENOSCOPY (EGD) WITH PROPOFOL;  Surgeon: Manya Silvas, MD;  Location: [facility] ENDOSCOPY;  Service: Endoscopy;  Laterality: N/A;  . ESOPHAGOGASTRODUODENOSCOPY (EGD) WITH PROPOFOL N/A 09/14/2016   Procedure: ESOPHAGOGASTRODUODENOSCOPY (EGD) WITH PROPOFOL;  Surgeon: Manya Silvas, MD;  Location: [facility] ENDOSCOPY;  Service: Endoscopy;  Laterality: N/A;  . ESOPHAGOGASTRODUODENOSCOPY (EGD) WITH PROPOFOL N/A 11/05/2016   Procedure: ESOPHAGOGASTRODUODENOSCOPY (EGD) WITH PROPOFOL;  Surgeon: Manya Silvas, MD;  Location: [facility] ENDOSCOPY;  Service: Endoscopy;  Laterality: N/A;  . ESOPHAGOGASTRODUODENOSCOPY (EGD) WITH PROPOFOL N/A 02/06/2017   Procedure: ESOPHAGOGASTRODUODENOSCOPY (EGD) WITH PROPOFOL;  Surgeon: Manya Silvas, MD;  Location: [facility] ENDOSCOPY;  Service: Endoscopy;  Laterality: N/A;  . TONSILLECTOMY    . TONSILLECTOMY AND ADENOIDECTOMY    . ULNAR NERVE TRANSPOSITION    . UVULOPALATOPHARYNGOPLASTY      There were no vitals filed for this visit.      Subjective Assessment - 04/08/17 1134    Subjective  Pt returns for OT visit 2/ 36 to address sever, stage III, BLE lymphedema. Pt brings wraps applied at last session. Pt states he has tried to work on his skin but has difficulty reaching his distal legs and feet.    Pertinent History Hx LE cellulitis and leg woundas, DM, HTN, OSA   Limitations Chronic BLE leg pain and swelling contributes to decreased balance, falls, difficulty walking  and impaired functional mobility/ transfers, BLE weakness and decreased AROM, unable to reach feet to bathe, inspect skin, perform skin and nail care., don street shoes and socks;  Unable to reach feet to don / doff lower body clothing.  .Difficulty with instrumental ADLs requiring prolonged standing,, walking, stepping up/ down on curbs, pushing, pulling and carrying,, including cleaning floors, cooking, grocery shopping, home maintenance;   Currently in Pain? No/denies             LYMPHEDEMA/ONCOLOGY QUESTIONNAIRE - 04/09/17 1143      Lymphedema Assessments   Lymphedema Assessments Lower extremities     Right Lower Extremity Lymphedema   Other RLE limb volume A-D ( ankle to below knee) = 5647.06 ml.    Other limb volume differential (LVD)= 19.60%, R>L     Left Lower Extremity Lymphedema   Other LLE A-D limb volume= 4540.31 ml                 OT Treatments/Exercises (OP) - 04/09/17 0001      ADLs   ADL Education Given Yes     Manual Therapy   Manual Therapy Edema management;Compression Bandaging   Manual therapy comments BLE Comparative volumetrics- baseline   Edema Management BLE compasrative limb volumetrics   Compression Bandaging LLE compression wraps applied from toes to below knee: Toe wrap to foot and digits 1-3 using Transelast Classic. Cotton stockinett under  8 cm x 5 m x 1 to foot and ankle, then 10 cm  x 5 m x 2 applied circumferentially in custommary layered gradient configuration. All wraps applied over  over  0.4 cm Rosidal foam to below knee.                OT Education - 04/08/17 1152    Education provided Yes   Education Details LLE compression wraps applied from toes to below knee: Toe wrap to foot and digits 1-3 using Transelast Classic.Cont LE self care edu throughout session. Explained baseline volumetrics results and how we ill usethese values to measure progress towards goals. Reviewed self compression wrapping. Reviewed need to see dermatologist to expedite skin care.   Person(s) Educated Patient   Methods Explanation;Demonstration;Tactile cues;Verbal cues;Handout   Comprehension Verbalized understanding;Need further instruction             OT Long Term  Goals - 03/19/17 1711      OT LONG TERM GOAL #1   Title Pt independent w/ lymphedema precautions and prevention principals and strategies to limit LE progression and infection risk.   Baseline dependent   Time 2   Period Weeks   Status New     OT LONG TERM GOAL #2   Title Lymphedema (LE) management/ self-care: Pt able to apply knee length, multi layered, gradient compression wraps with maximum caregiver assistance using proper techniques and assistive devices for repositioning within 2 weeks to achieve optimal limb volume reductions bilaterally.   Baseline dependent   Time 2   Period Weeks   Status New     OT LONG TERM GOAL #4   Title Lymphedema (LE) management/ self-care:  Pt to achieve at least 10% limb volume reduction  bilaterally below the knees during Intensive Phase CDT to limit progression, to reduce leg pain, and to improve ADLs performance, and to facilitate safe functional mobility and ambulation.   Baseline dependent   Time 12   Period Weeks   Status New     OT LONG TERM GOAL #5   Title Lymphedema (  LE) management/ self-care:  Pt to tolerate daily compression wraps, compression garments and/ or HOS devices in keeping w/ prescribed wear regime within 1 week of issue date of each to progress and retain clinical and functional gains and to limit LE progression.   Baseline dependent   Time 12   Period Weeks   Status New     Long Term Additional Goals   Additional Long Term Goals Yes     OT LONG TERM GOAL #6   Title Lymphedema (LE) management/ self-care:  During Management Phase CDT Pt to sustain current limb volumes within 5%, and all other clinical gains achieved during OT treatment with needed level of caregiver assistance to limit LE progression, infection risk and further functional decline.   Baseline dependent   Time 6   Period Months   Status New               Plan - 04/09/17 1154    Clinical Impression Statement BLE limb volumetrics reveal that RLE  limb volume below the knee is 19.6% greater on the R than the L. Skin condition is unchanged since last seen for initial evaluation. Dime sized open area on posterior L calf presents w/ lymphorrhea today. Sock is very well. No odor ior signs of skin infection are observed. Discussed recommendations tjhat Pt schedule visit to wound clinic asap, and Pt attend dermatology consult to facilitate debridement of crusty skin and hyperkeritosis  in conjunction with OT for CDT. Referring MD notified re these reeferral requests in EPIC.   Occupational performance deficits (Please refer to evaluation for details): IADL's;ADL's;Rest and Sleep;Work;Leisure;Social Participation   Rehab Potential Fair   OT Frequency 3x / week   OT Duration Other (comment)  and PRN. Severity of condition in bilateral legs will most likely require double this time  to reduce swelling, improve skin condition and fit custom compression garments, and to train Pt in all LE self care protocols.   OT Treatment/Interventions Self-care/ADL training;Therapeutic exercise;Patient/family education;Manual Therapy;Manual lymph drainage;Therapeutic exercises;Other (comment);DME and/or AE instruction;Compression bandaging;Therapeutic activities  skin care      Patient will benefit from skilled therapeutic intervention in order to improve the following deficits and impairments:  Abnormal gait, Decreased endurance, Decreased skin integrity, Decreased knowledge of precautions, Impaired perceived functional ability, Decreased activity tolerance, Decreased knowledge of use of DME, Decreased strength, Impaired flexibility, Decreased balance, Decreased mobility, Difficulty walking, Impaired sensation, Obesity, Decreased range of motion, Increased edema, Pain  Visit Diagnosis: Lymphedema, not elsewhere classified    Problem List Patient Active Problem List   Diagnosis Date Noted  . Cellulitis in diabetic foot ([place]) 02/21/2017  . Uncontrolled diabetes  mellitus ([place]) 02/21/2017  . Cellulitis 02/21/2017  . GI bleed 07/20/2015  . Hematemesis 02/08/2015  . Cirrhosis ([place]) 02/08/2015  . Esophageal varices ([place]) 02/08/2015  . Type 2 diabetes mellitus ([place]) 02/08/2015  . Gastroesophageal reflux disease 02/08/2015  . Hyperlipidemia 02/08/2015  . Anemia 12/04/2013  . Thrombocytopenia ([place]) 12/04/2013  . Splenomegaly 11/04/2013    Andrey Spearman, MS, OTR/L, [facility] 04/09/17 12:01 PM   [place] MAIN [facility] SERVICES [address] [place], [place], [address] Phone: [phone]   Fax:  [phone]  Name: Corey West MRN: [id number] Date of Birth: Mar 01, 1951

## 2017-04-10 ENCOUNTER — Ambulatory Visit: Payer: Medicare Other | Admitting: Physical Therapy

## 2017-04-10 ENCOUNTER — Ambulatory Visit
Admission: RE | Admit: 2017-04-10 | Discharge: 2017-04-10 | Disposition: A | Discharge status: Home / Self Care | Payer: Medicare Other | Source: Ambulatory Visit | Attending: Unknown Physician Specialty | Admitting: Unknown Physician Specialty

## 2017-04-10 DIAGNOSIS — D500 Iron deficiency anemia secondary to blood loss (chronic): Secondary | ICD-10-CM | POA: Insufficient documentation

## 2017-04-10 DIAGNOSIS — Q2733 Arteriovenous malformation of digestive system vessel: Secondary | ICD-10-CM | POA: Insufficient documentation

## 2017-04-10 MED ORDER — SODIUM CHLORIDE 0.9 % IV SOLN
Freq: Once | INTRAVENOUS | Status: AC
  Administered 2017-04-10: 08:00:00 via INTRAVENOUS

## 2017-04-11 LAB — TYPE AND SCREEN
ABO/RH(D): A NEG
ANTIBODY SCREEN: NEGATIVE
Extend sample reason: TRANSFUSED
UNIT DIVISION: 0
UNIT DIVISION: 0

## 2017-04-11 LAB — BPAM RBC
Blood Product Expiration Date: 201807202359
Blood Product Expiration Date: 201807252359
ISSUE DATE / TIME: 201807111014
ISSUE DATE / TIME: 201807110736
UNIT TYPE AND RH: 600
Unit Type and Rh: 600

## 2017-04-15 ENCOUNTER — Ambulatory Visit: Payer: Medicare Other | Admitting: Occupational Therapy

## 2017-04-16 ENCOUNTER — Ambulatory Visit: Payer: Medicare Other | Admitting: Occupational Therapy

## 2017-04-18 ENCOUNTER — Ambulatory Visit: Payer: Medicare Other | Admitting: Occupational Therapy

## 2017-04-23 ENCOUNTER — Encounter: Payer: Self-pay | Admitting: *Deleted

## 2017-04-23 ENCOUNTER — Ambulatory Visit: Payer: Medicare Other | Admitting: Occupational Therapy

## 2017-04-23 ENCOUNTER — Encounter: Payer: Medicare Other | Attending: Internal Medicine | Admitting: Internal Medicine

## 2017-04-23 DIAGNOSIS — G4733 Obstructive sleep apnea (adult) (pediatric): Secondary | ICD-10-CM | POA: Diagnosis not present

## 2017-04-23 DIAGNOSIS — I890 Lymphedema, not elsewhere classified: Secondary | ICD-10-CM | POA: Diagnosis not present

## 2017-04-23 DIAGNOSIS — I87331 Chronic venous hypertension (idiopathic) with ulcer and inflammation of right lower extremity: Secondary | ICD-10-CM | POA: Insufficient documentation

## 2017-04-23 DIAGNOSIS — E11622 Type 2 diabetes mellitus with other skin ulcer: Secondary | ICD-10-CM | POA: Insufficient documentation

## 2017-04-23 DIAGNOSIS — D649 Anemia, unspecified: Secondary | ICD-10-CM | POA: Diagnosis not present

## 2017-04-23 DIAGNOSIS — F17290 Nicotine dependence, other tobacco product, uncomplicated: Secondary | ICD-10-CM | POA: Insufficient documentation

## 2017-04-23 DIAGNOSIS — K7460 Unspecified cirrhosis of liver: Secondary | ICD-10-CM | POA: Insufficient documentation

## 2017-04-23 DIAGNOSIS — I10 Essential (primary) hypertension: Secondary | ICD-10-CM | POA: Diagnosis not present

## 2017-04-23 DIAGNOSIS — L97211 Non-pressure chronic ulcer of right calf limited to breakdown of skin: Secondary | ICD-10-CM | POA: Diagnosis not present

## 2017-04-23 DIAGNOSIS — Z794 Long term (current) use of insulin: Secondary | ICD-10-CM | POA: Insufficient documentation

## 2017-04-23 DIAGNOSIS — Z6841 Body Mass Index (BMI) 40.0 and over, adult: Secondary | ICD-10-CM | POA: Diagnosis not present

## 2017-04-23 NOTE — Progress Notes (Signed)
   02/22/17 1215  PT Time Calculation  PT Start Time (ACUTE ONLY) 1111  PT Stop Time (ACUTE ONLY) 1152  PT Time Calculation (min) (ACUTE ONLY) 41 min  PT G-Codes **NOT FOR INPATIENT CLASS**  Functional Assessment Tool Used AM-PAC 6 Clicks Basic Mobility  Functional Limitation Mobility: Walking and moving around  Mobility: Walking and Moving Around Current Status (N8177) CK  Mobility: Walking and Moving Around Goal Status (N1657) CI  PT General Charges  $$ ACUTE PT VISIT 1 Procedure  PT Evaluation  $PT Eval Low Complexity 1 Procedure  PT Treatments  $Therapeutic Activity 8-22 mins   Late entry G-codes entered after review of initial documentation.  [name], PT 04/23/17, 1:35 PM [phone]

## 2017-04-24 ENCOUNTER — Ambulatory Visit: Payer: Medicare Other | Admitting: Anesthesiology

## 2017-04-24 ENCOUNTER — Ambulatory Visit
Admission: RE | Admit: 2017-04-24 | Discharge: 2017-04-24 | Disposition: A | Discharge status: Home / Self Care | Payer: Medicare Other | Source: Ambulatory Visit | Attending: Unknown Physician Specialty | Admitting: Unknown Physician Specialty

## 2017-04-24 ENCOUNTER — Encounter
Admission: RE | Disposition: A | Discharge status: Home / Self Care | Payer: Self-pay | Source: Ambulatory Visit | Attending: Unknown Physician Specialty

## 2017-04-24 ENCOUNTER — Encounter: Payer: Self-pay | Admitting: *Deleted

## 2017-04-24 DIAGNOSIS — Z794 Long term (current) use of insulin: Secondary | ICD-10-CM | POA: Diagnosis not present

## 2017-04-24 DIAGNOSIS — K7460 Unspecified cirrhosis of liver: Secondary | ICD-10-CM | POA: Diagnosis not present

## 2017-04-24 DIAGNOSIS — K3189 Other diseases of stomach and duodenum: Secondary | ICD-10-CM | POA: Diagnosis not present

## 2017-04-24 DIAGNOSIS — G4730 Sleep apnea, unspecified: Secondary | ICD-10-CM | POA: Diagnosis not present

## 2017-04-24 DIAGNOSIS — I10 Essential (primary) hypertension: Secondary | ICD-10-CM | POA: Insufficient documentation

## 2017-04-24 DIAGNOSIS — Z6841 Body Mass Index (BMI) 40.0 and over, adult: Secondary | ICD-10-CM | POA: Diagnosis not present

## 2017-04-24 DIAGNOSIS — Z79899 Other long term (current) drug therapy: Secondary | ICD-10-CM | POA: Diagnosis not present

## 2017-04-24 DIAGNOSIS — F329 Major depressive disorder, single episode, unspecified: Secondary | ICD-10-CM | POA: Insufficient documentation

## 2017-04-24 DIAGNOSIS — K317 Polyp of stomach and duodenum: Secondary | ICD-10-CM | POA: Diagnosis not present

## 2017-04-24 DIAGNOSIS — K210 Gastro-esophageal reflux disease with esophagitis: Secondary | ICD-10-CM | POA: Insufficient documentation

## 2017-04-24 DIAGNOSIS — E119 Type 2 diabetes mellitus without complications: Secondary | ICD-10-CM | POA: Insufficient documentation

## 2017-04-24 DIAGNOSIS — K31811 Angiodysplasia of stomach and duodenum with bleeding: Secondary | ICD-10-CM | POA: Diagnosis not present

## 2017-04-24 DIAGNOSIS — F419 Anxiety disorder, unspecified: Secondary | ICD-10-CM | POA: Diagnosis not present

## 2017-04-24 DIAGNOSIS — D649 Anemia, unspecified: Secondary | ICD-10-CM | POA: Insufficient documentation

## 2017-04-24 HISTORY — PX: ESOPHAGOGASTRODUODENOSCOPY (EGD) WITH PROPOFOL: SHX5813

## 2017-04-24 LAB — PROTIME-INR
INR: 1.29
Prothrombin Time: 16.2 seconds — ABNORMAL HIGH (ref 11.4–15.2)

## 2017-04-24 LAB — CBC WITH DIFFERENTIAL/PLATELET
Basophils Absolute: 0 10*3/uL (ref 0–0.1)
Basophils Relative: 0 %
Eosinophils Absolute: 0 10*3/uL (ref 0–0.7)
Eosinophils Relative: 0 %
HEMATOCRIT: 27.5 % — AB (ref 40.0–52.0)
Hemoglobin: 9.6 g/dL — ABNORMAL LOW (ref 13.0–18.0)
LYMPHS ABS: 0.6 10*3/uL — AB (ref 1.0–3.6)
LYMPHS PCT: 4 %
MCH: 31.4 pg (ref 26.0–34.0)
MCHC: 35.1 g/dL (ref 32.0–36.0)
MCV: 89.4 fL (ref 80.0–100.0)
MONO ABS: 0.8 10*3/uL (ref 0.2–1.0)
MONOS PCT: 5 %
NEUTROS ABS: 15.3 10*3/uL — AB (ref 1.4–6.5)
Neutrophils Relative %: 91 %
Platelets: 160 10*3/uL (ref 150–440)
RBC: 3.07 MIL/uL — ABNORMAL LOW (ref 4.40–5.90)
RDW: 14.6 % — AB (ref 11.5–14.5)
WBC: 16.6 10*3/uL — ABNORMAL HIGH (ref 3.8–10.6)

## 2017-04-24 LAB — GLUCOSE, CAPILLARY: Glucose-Capillary: 250 mg/dL — ABNORMAL HIGH (ref 65–99)

## 2017-04-24 SURGERY — ESOPHAGOGASTRODUODENOSCOPY (EGD) WITH PROPOFOL
Anesthesia: General

## 2017-04-24 MED ORDER — FENTANYL CITRATE (PF) 100 MCG/2ML IJ SOLN
INTRAMUSCULAR | Status: AC
  Filled 2017-04-24: qty 2

## 2017-04-24 MED ORDER — PROPOFOL 10 MG/ML IV BOLUS
INTRAVENOUS | Status: AC
  Filled 2017-04-24: qty 20

## 2017-04-24 MED ORDER — PROPOFOL 500 MG/50ML IV EMUL
INTRAVENOUS | Status: DC | PRN
  Administered 2017-04-24: 120 ug/kg/min via INTRAVENOUS

## 2017-04-24 MED ORDER — MIDAZOLAM HCL 2 MG/2ML IJ SOLN
INTRAMUSCULAR | Status: AC
  Filled 2017-04-24: qty 2

## 2017-04-24 MED ORDER — SODIUM CHLORIDE 0.9 % IV SOLN
INTRAVENOUS | Status: DC
  Administered 2017-04-24 (×2): via INTRAVENOUS

## 2017-04-24 MED ORDER — PROPOFOL 500 MG/50ML IV EMUL
INTRAVENOUS | Status: AC
  Filled 2017-04-24: qty 50

## 2017-04-24 MED ORDER — SODIUM CHLORIDE 0.9 % IV SOLN: INTRAVENOUS | Status: DC

## 2017-04-24 MED ORDER — FENTANYL CITRATE (PF) 100 MCG/2ML IJ SOLN
INTRAMUSCULAR | Status: DC | PRN
  Administered 2017-04-24: 25 ug via INTRAVENOUS

## 2017-04-24 MED ORDER — MIDAZOLAM HCL 5 MG/5ML IJ SOLN
INTRAMUSCULAR | Status: DC | PRN
  Administered 2017-04-24: 2 mg via INTRAVENOUS

## 2017-04-24 MED ORDER — PROPOFOL 10 MG/ML IV BOLUS
INTRAVENOUS | Status: DC | PRN
  Administered 2017-04-24: 30 mg via INTRAVENOUS
  Administered 2017-04-24: 20 mg via INTRAVENOUS

## 2017-04-24 NOTE — Progress Notes (Signed)
JAD, JOHANSSON ([id number]) Visit Report for 04/23/2017 Allergy List Details Patient Name: Corey West, Corey West. Date of Service: 04/23/2017 8:00 AM Medical Record Number: [id number] Patient Account Number: [id number] Date of Birth/Sex: 10-18-1950 (66 y.o. Male) Treating RN: Cornell Barman Primary Care Taveon Enyeart: Lynett Fish Other Clinician: Referring Lathon Adan: Myrtis Ser Treating Benay Pomeroy/Extender: Ricard Dillon Weeks in Treatment: 0 Allergies Active Allergies No Known Drug Allergies Allergy Notes Electronic Signature(s) Signed: 04/23/2017 12:24:00 PM By: Gretta Cool, BSN, RN, CWS, Kim RN, BSN Entered By: Gretta Cool, BSN, RN, CWS, Kim on 04/23/2017 08:41:48 Corey West ([id number]) -------------------------------------------------------------------------------- [place] Information Details Patient Name: [name], [name] Date of Service: 04/23/2017 8:00 AM Medical Record Number: [id number] Patient Account Number: [id number] Date of Birth/Sex: [birth year] (66 y.o. Male) Treating RN: Cornell Barman Primary Care Deb Loudin: Lynett Fish Other Clinician: Referring Tauri Ethington: Myrtis Ser Treating Keyvin Rison/Extender: Tito Dine in Treatment: 0 Visit Information Patient Arrived: Ambulatory Arrival Time: 08:20 Accompanied By: self Transfer Assistance: None Patient Identification Verified: Yes Secondary Verification Process Yes Completed: History Since Last Visit Added or deleted any medications: No Any new allergies or adverse reactions: No Had a fall or experienced change in activities of daily living that may affect risk of falls: No Signs or symptoms of abuse/neglect since last visito No Hospitalized since last visit: No Pain Present Now: Yes Electronic Signature(s) Signed: 04/23/2017 12:24:00 PM By: Gretta Cool, BSN, RN, CWS, Kim RN, BSN Entered By: Gretta Cool, BSN, RN, CWS, Kim on 04/23/2017 08:23:18 Corey West  ([id number]) -------------------------------------------------------------------------------- Clinic Level of Care Assessment Details Patient Name: [name], [name] Date of Service: 04/23/2017 8:00 AM Medical Record Number: [id number] Patient Account Number: [id number] Date of Birth/Sex: [birth year] (66 y.o. Male) Treating RN: Cornell Barman Primary Care Jamail Cullers: Lynett Fish Other Clinician: Referring Addisen Chappelle: Myrtis Ser Treating Aliana Kreischer/Extender: Tito Dine in Treatment: 0 Clinic Level of Care Assessment Items TOOL 1 Quantity Score []  - Use when EandM and Procedure is performed on INITIAL visit 0 ASSESSMENTS - Nursing Assessment / Reassessment X - General Physical Exam (combine w/ comprehensive assessment (listed just 1 20 below) when performed on new pt. evals) X - Comprehensive Assessment (HX, ROS, Risk Assessments, Wounds Hx, etc.) 1 25 ASSESSMENTS - Wound and Skin Assessment / Reassessment []  - Dermatologic / Skin Assessment (not related to wound area) 0 ASSESSMENTS - Ostomy and/or Continence Assessment and Care []  - Incontinence Assessment and Management 0 []  - Ostomy Care Assessment and Management (repouching, etc.) 0 PROCESS - Coordination of Care X - Simple Patient / Family Education for ongoing care 1 15 []  - Complex (extensive) Patient / Family Education for ongoing care 0 X - Staff obtains Programmer, systems, Records, Test Results / Process Orders 1 10 []  - Staff telephones HHA, Nursing Homes / Clarify orders / etc 0 []  - Routine Transfer to another Facility (non-emergent condition) 0 []  - Routine Hospital Admission (non-emergent condition) 0 X - New Admissions / Biomedical engineer / Ordering NPWT, Apligraf, etc. 1 15 []  - Emergency Hospital Admission (emergent condition) 0 PROCESS - Special Needs []  - Pediatric / Minor Patient Management 0 []  - Isolation Patient Management 0 Corey West, Corey West ([id number]) []  - Hearing / Language / Visual special needs 0 []   - Assessment of Community assistance (transportation, D/C planning, etc.) 0 []  - Additional assistance / Altered mentation 0 []  - Support Surface(s) Assessment (bed, cushion, seat, etc.) 0 INTERVENTIONS - Miscellaneous []  - External ear exam 0 []  - Patient Transfer (multiple staff / Civil Service fast streamer /  Similar devices) 0 []  - Simple Staple / Suture removal (25 or less) 0 []  - Complex Staple / Suture removal (26 or more) 0 []  - Hypo/Hyperglycemic Management (do not check if billed separately) 0 []  - Ankle / Brachial Index (ABI) - do not check if billed separately 0 Has the patient been seen at the hospital within the last three years: Yes Total Score: 85 Level Of Care: New/Established - Level 3 Electronic Signature(s) Signed: 04/23/2017 12:24:00 PM By: Gretta Cool, BSN, RN, CWS, Kim RN, BSN Entered By: Gretta Cool, BSN, RN, CWS, Kim on 04/23/2017 09:06:18 Corey West ([id number]) -------------------------------------------------------------------------------- Compression Therapy Details Patient Name: Corey West, Corey West A. Date of Service: 04/23/2017 8:00 AM Medical Record Number: [id number] Patient Account Number: [id number] Date of Birth/Sex: [birth year] (66 y.o. Male) Treating RN: Cornell Barman Primary Care Woodrow Drab: Lynett Fish Other Clinician: Referring Nickcole Bralley: Myrtis Ser Treating Jayleon Mcfarlane/Extender: Ricard Dillon Weeks in Treatment: 0 Compression Therapy Performed for Wound Wound #3 Right Lower Leg Assessment: Performed By: Clinician Cornell Barman, RN Compression Type: Rolena Infante Post Procedure Diagnosis Same as Pre-procedure Electronic Signature(s) Signed: 04/23/2017 12:24:00 PM By: Gretta Cool, BSN, RN, CWS, Kim RN, BSN Entered By: Gretta Cool, BSN, RN, CWS, Kim on 04/23/2017 09:06:38 Corey West ([id number]) -------------------------------------------------------------------------------- Encounter Discharge Information Details Patient Name: Corey West, Corey West A. Date of Service: 04/23/2017 8:00 AM Medical  Record Number: [id number] Patient Account Number: [id number] Date of Birth/Sex: [birth year] (66 y.o. Male) Treating RN: Cornell Barman Primary Care Jeryn Cerney: Lynett Fish Other Clinician: Referring Yunus Stoklosa: Myrtis Ser Treating Whittaker Lenis/Extender: Tito Dine in Treatment: 0 Encounter Discharge Information Items Discharge Pain Level: 0 Discharge Condition: Stable Ambulatory Status: Cane Discharge Destination: Home Transportation: Private Auto Accompanied By: self Schedule Follow-up Appointment: Yes Medication Reconciliation completed Yes and provided to Patient/Care Dhruti Ghuman: Provided on Clinical Summary of Care: 04/23/2017 Form Type Recipient Paper Patient [name] Signature(s) Signed: 04/23/2017 9:20:48 AM By: Ruthine Dose Entered By: Ruthine Dose on 04/23/2017 09:20:47 Corey West ([id number]) -------------------------------------------------------------------------------- Lower Extremity Assessment Details Patient Name: Corey West A. Date of Service: 04/23/2017 8:00 AM Medical Record Number: [id number] Patient Account Number: [id number] Date of Birth/Sex: Nov 12, 1950 (66 y.o. Male) Treating RN: Cornell Barman Primary Care Kihanna Kamiya: Lynett Fish Other Clinician: Referring Karrington Mccravy: Myrtis Ser Treating Spirit Wernli/Extender: Ricard Dillon Weeks in Treatment: 0 Edema Assessment Assessed: [Left: No] [Right: No] E[Left: dema] [Right: :] Calf Left: Right: Point of Measurement: 32 cm From Medial Instep 48 cm 50 cm Ankle Left: Right: Point of Measurement: 13 cm From Medial Instep 28.5 cm 30 cm Vascular Assessment Claudication: Claudication Assessment [Left:None] [Right:None] Pulses: Dorsalis Pedis Palpable: [Left:No] [Right:No] Doppler Audible: [Left:Yes] [Right:Yes] Posterior Tibial Palpable: [Left:No] [Right:No] Doppler Audible: [Left:Inaudible] [Right:Inaudible] Extremity colors, hair growth, and conditions: Extremity Color: [Left:Red]  [Right:Red] Hair Growth on Extremity: [Left:No] [Right:No] Temperature of Extremity: [Left:Warm] Capillary Refill: [Left:< 3 seconds] [Right:< 3 seconds] Dependent Rubor: [Left:No] [Right:No] Blanched when Elevated: [Left:No] [Right:No] 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 ([id number]) Improper Length and Hygiene: No No Notes Patient has +2 pitting edema bilaterally. Cannot palpate DP pulses de to swelling. PT pulses cannot be heard due to excessive amounts of dried drainage. Electronic Signature(s) Signed: 04/23/2017 12:24:00 PM By: Gretta Cool, BSN, RN, CWS, Kim RN, BSN Entered By: Gretta Cool, BSN, RN, CWS, Kim on 04/23/2017 08:41:44 Corey West, Corey West ([id number]) -------------------------------------------------------------------------------- Multi Wound Chart Details Patient Name: Corey West, Corey West A. Date of Service: 04/23/2017 8:00 AM Medical Record Number:  [id number] Patient Account Number: [id number] Date of Birth/Sex: 1951-08-02 (66 y.o. Male) Treating RN: Cornell Barman Primary Care Nataliee Shurtz: Lynett Fish Other Clinician: Referring Braden Cimo: Myrtis Ser Treating Taos Tapp/Extender: Ricard Dillon Weeks in Treatment: 0 Vital Signs Height(in): 69 Pulse(bpm): 75 Weight(lbs): 305.4 Blood Pressure 152/51 (mmHg): Body Mass Index(BMI): 45 Temperature(F): 97.9 Respiratory Rate 16 (breaths/min): Photos: [N/A:N/A] Wound Location: Right Lower Leg Right Lower Leg - Lateral N/A Wounding Event: Gradually Appeared Gradually Appeared N/A Primary Etiology: Lymphedema Lymphedema N/A Secondary Etiology: Diabetic Wound/Ulcer of Diabetic Wound/Ulcer of N/A the Lower Extremity the Lower Extremity Date Acquired: 01/14/2017 01/07/2017 N/A Weeks of Treatment: 0 0 N/A Wound Status: Open Open N/A Measurements L x W x D 7x7x1 1x1.2x0.2 N/A (cm) Area (cm) : 38.485 0.942 N/A Volume (cm) :  38.485 0.188 N/A Classification: Partial Thickness Full Thickness Without N/A Exposed Support Structures Exudate Amount: Large Large N/A Exudate Type: Serous Serous N/A Exudate Color: amber amber N/A Wound Margin: Indistinct, nonvisible Flat and Intact N/A Granulation Amount: None Present (0%) Small (1-33%) N/A Granulation Quality: N/A Pink, Pale N/A Necrotic Amount: None Present (0%) Medium (34-66%) N/A Exposed Structures: Fascia: No Fat Layer (Subcutaneous N/A Fat Layer (Subcutaneous Tissue) Exposed: Yes Corey West, Corey West A. ([id number]) Tissue) Exposed: No Fascia: No Tendon: No Tendon: No Muscle: No Muscle: No Joint: No Joint: No Bone: No Bone: No Epithelialization: None None N/A Periwound Skin Texture: Induration: Yes No Abnormalities Noted N/A Excoriation: No Callus: No Crepitus: No Rash: No Scarring: No Periwound Skin Maceration: No No Abnormalities Noted N/A Moisture: Dry/Scaly: No Periwound Skin Color: Hemosiderin Staining: Yes Erythema: Yes N/A Atrophie Blanche: No Cyanosis: No Ecchymosis: No Erythema: No Mottled: No Pallor: No Rubor: No Erythema Location: N/A Circumferential N/A Temperature: No Abnormality N/A N/A Tenderness on Yes No N/A Palpation: Wound Preparation: Ulcer Cleansing: Ulcer Cleansing: N/A Rinsed/Irrigated with Rinsed/Irrigated with Saline Saline Topical Anesthetic Topical Anesthetic Applied: None Applied: None Procedures Performed: Compression Therapy N/A N/A Treatment Notes Wound #3 (Right Lower Leg) 1. Cleansed with: Clean wound with Normal Saline 2. Anesthetic Topical Lidocaine 4% cream to wound bed prior to debridement 3. Peri-wound Care: Barrier cream 4. Dressing Applied: Aquacel Ag 5. Secondary Dressing Applied ABD Pad 7. Secured with [employer] to Left Lower Extremity Corey West, Corey West ([id number]) Wound #4 (Right, Lateral Lower Leg) 1. Cleansed with: Clean wound with Normal Saline 2. Anesthetic Topical Lidocaine 4%  cream to wound bed prior to debridement 3. Peri-wound Care: Barrier cream 4. Dressing Applied: Aquacel Ag 5. Secondary Dressing Applied ABD Pad 7. Secured with Rolena Infante to Left Lower Extremity Electronic Signature(s) Signed: 04/23/2017 5:23:04 PM By: Linton Ham MD Entered By: Linton Ham on 04/23/2017 09:23:20 Corey West ([id number]) -------------------------------------------------------------------------------- [place] Details Patient Name: TAMI, BARREN A. Date of Service: 04/23/2017 8:00 AM Medical Record Number: [id number] Patient Account Number: [id number] Date of Birth/Sex: October 16, 1950 (66 y.o. Male) Treating RN: Cornell Barman Primary Care Clarita Mcelvain: Lynett Fish Other Clinician: Referring Mckynlie Vanderslice: Myrtis Ser Treating Iman Reinertsen/Extender: Tito Dine in Treatment: 0 Active Inactive ` Orientation to the Wound Care Program Nursing Diagnoses: Knowledge deficit related to the wound healing center program Goals: Patient/caregiver will verbalize understanding of the [place] Program Date Initiated: 04/23/2017 Target Resolution Date: 05/04/2017 Goal Status: Active Interventions: Provide education on orientation to the wound center Notes: ` Venous Leg Ulcer Nursing Diagnoses: Potential for venous Insuffiency (use before diagnosis confirmed) Goals: Patient will maintain optimal edema control Date Initiated: 04/23/2017 Target Resolution Date: 05/04/2017 Goal Status: Active  Interventions: Compression as ordered Treatment Activities: Therapeutic compression applied : 04/23/2017 Notes: ` Wound/Skin Impairment EVONTE, PRESTAGE ([id number]) Nursing Diagnoses: Impaired tissue integrity Goals: Ulcer/skin breakdown will have a volume reduction of 80% by week 12 Date Initiated: 04/23/2017 Target Resolution Date: 05/04/2017 Goal Status: Active Interventions: Assess ulceration(s) every visit Notes: Electronic  Signature(s) Signed: 04/23/2017 12:24:00 PM By: Gretta Cool, BSN, RN, CWS, Kim RN, BSN Entered By: Gretta Cool, BSN, RN, CWS, Kim on 04/23/2017 09:01:43 Corey West ([id number]) -------------------------------------------------------------------------------- Pain Assessment Details Patient Name: JAMARRIUS, SALAY A. Date of Service: 04/23/2017 8:00 AM Medical Record Number: [id number] Patient Account Number: [id number] Date of Birth/Sex: 23-Feb-1951 (66 y.o. Male) Treating RN: Cornell Barman Primary Care Chade Pitner: Lynett Fish Other Clinician: Referring Glorimar Stroope: Myrtis Ser Treating Atiya Yera/Extender: Ricard Dillon Weeks in Treatment: 0 Active Problems Location of Pain Severity and Description of Pain Patient Has Paino No Site Locations With Dressing Change: No Pain Management and Medication Current Pain Management: Goals for Pain Management Topical or injectable lidocaine is offered to patient for acute pain when surgical debridement is performed. If needed, Patient is instructed to use over the counter pain medication for the following 24-48 hours after debridement. Wound care MDs do not prescribed pain medications. Patient has chronic pain or uncontrolled pain. Patient has been instructed to make an appointment with their Primary Care Physician for pain management. Electronic Signature(s) Signed: 04/23/2017 12:24:00 PM By: Gretta Cool, BSN, RN, CWS, Kim RN, BSN Entered By: Gretta Cool, BSN, RN, CWS, Kim on 04/23/2017 08:23:31 Corey West ([id number]) -------------------------------------------------------------------------------- Patient/Caregiver Education Details Patient Name: MARKELL, SCIASCIA A. Date of Service: 04/23/2017 8:00 AM Medical Record Number: [id number] Patient Account Number: [id number] Date of Birth/Gender: 01-04-1951 (66 y.o. Male) Treating RN: Cornell Barman Primary Care Physician: Lynett Fish Other Clinician: Referring Physician: Myrtis Ser Treating Physician/Extender: Tito Dine in Treatment: 0 Education Assessment Education Provided To: Patient Education Topics Provided Venous: Handouts: Controlling Swelling with Multilayered Compression Wraps Methods: Demonstration Responses: State content correctly Welcome To [facility]: Handouts: Welcome To [facility] Methods: Demonstration Responses: State content correctly Wound/Skin Impairment: Electronic Signature(s) Signed: 04/23/2017 12:24:00 PM By: Gretta Cool, BSN, RN, CWS, Kim RN, BSN Entered By: Gretta Cool, BSN, RN, CWS, Kim on 04/23/2017 09:20:40 Corey West ([id number]) -------------------------------------------------------------------------------- Wound Assessment Details Patient Name: Corey West, Corey West A. Date of Service: 04/23/2017 8:00 AM Medical Record Number: [id number] Patient Account Number: [id number] Date of Birth/Sex: [birth year] (66 y.o. Male) Treating RN: Cornell Barman Primary Care Elica Almas: Lynett Fish Other Clinician: Referring Jaylina Ramdass: Myrtis Ser Treating Clarivel Callaway/Extender: Ricard Dillon Weeks in Treatment: 0 Wound Status Wound Number: 3 Primary Etiology: Lymphedema Wound Location: Right Lower Leg Secondary Diabetic Wound/Ulcer of the Lower Etiology: Extremity Wounding Event: Gradually Appeared Wound Status: Open Date Acquired: 01/14/2017 Weeks Of Treatment: 0 Clustered Wound: No Photos Wound Measurements Length: (cm) 7 Width: (cm) 7 Depth: (cm) 1 Area: (cm) 38.485 Volume: (cm) 38.485 % Reduction in Area: % Reduction in Volume: Epithelialization: None Tunneling: No Undermining: No Wound Description Classification: Partial Thickness Wound Margin: Indistinct, nonvisible Exudate Amount: Large Exudate Type: Serous Exudate Color: amber Foul Odor After Cleansing: No Slough/Fibrino Yes Wound Bed Granulation Amount: None Present (0%) Exposed Structure Necrotic Amount: None Present (0%) Fascia Exposed: No Fat Layer (Subcutaneous Tissue)  Exposed: No Tendon Exposed: No Muscle Exposed: No Joint Exposed: No Bone Exposed: No AVRAJ, LINDROTH A. ([id number]) Periwound Skin Texture Texture Color No Abnormalities Noted: No No Abnormalities Noted: No Callus: No Atrophie Blanche: No Crepitus: No  Cyanosis: No Excoriation: No Ecchymosis: No Induration: Yes Erythema: No Rash: No Hemosiderin Staining: Yes Scarring: No Mottled: No Pallor: No Moisture Rubor: No No Abnormalities Noted: No Dry / Scaly: No Temperature / Pain Maceration: No Temperature: No Abnormality Tenderness on Palpation: Yes Wound Preparation Ulcer Cleansing: Rinsed/Irrigated with Saline Topical Anesthetic Applied: None Treatment Notes Wound #3 (Right Lower Leg) 1. Cleansed with: Clean wound with Normal Saline 2. Anesthetic Topical Lidocaine 4% cream to wound bed prior to debridement 3. Peri-wound Care: Barrier cream 4. Dressing Applied: Aquacel Ag 5. Secondary Dressing Applied ABD Pad 7. Secured with Rolena Infante to Left Lower Extremity Electronic Signature(s) Signed: 04/23/2017 12:24:00 PM By: Gretta Cool, BSN, RN, CWS, Kim RN, BSN Entered By: Gretta Cool, BSN, RN, CWS, Kim on 04/23/2017 08:34:05 Corey West, Corey West ([id number]) -------------------------------------------------------------------------------- Wound Assessment Details Patient Name: ADONAI, SELSOR. Date of Service: 04/23/2017 8:00 AM Medical Record Number: [id number] Patient Account Number: [id number] Date of Birth/Sex: 05-Nov-1950 (66 y.o. Male) Treating RN: Cornell Barman Primary Care Izea Livolsi: Lynett Fish Other Clinician: Referring Deunta Beneke: Myrtis Ser Treating Dodd Schmid/Extender: Ricard Dillon Weeks in Treatment: 0 Wound Status Wound Number: 4 Primary Etiology: Lymphedema Wound Location: Right Lower Leg - Lateral Secondary Diabetic Wound/Ulcer of the Lower Etiology: Extremity Wounding Event: Gradually Appeared Wound Status: Open Date Acquired: 01/07/2017 Weeks Of Treatment:  0 Clustered Wound: No Photos Wound Measurements Length: (cm) 1 Width: (cm) 1.2 Depth: (cm) 0.2 Area: (cm) 0.942 Volume: (cm) 0.188 % Reduction in Area: % Reduction in Volume: Epithelialization: None Tunneling: No Undermining: No Wound Description Full Thickness Without Exposed Classification: Support Structures Wound Margin: Flat and Intact Exudate Large Amount: Exudate Type: Serous Exudate Color: amber Foul Odor After Cleansing: No Slough/Fibrino Yes Wound Bed Granulation Amount: Small (1-33%) Exposed Structure Granulation Quality: Pink, Pale Fascia Exposed: No Necrotic Amount: Medium (34-66%) Fat Layer (Subcutaneous Tissue) Exposed: Yes Necrotic Quality: Adherent Slough Tendon Exposed: No Muscle Exposed: No Joint Exposed: No AXELL, TRIGUEROS ([id number]) Bone Exposed: No Periwound Skin Texture Texture Color No Abnormalities Noted: No No Abnormalities Noted: No Erythema: Yes Moisture Erythema Location: Circumferential No Abnormalities Noted: No Wound Preparation Ulcer Cleansing: Rinsed/Irrigated with Saline Topical Anesthetic Applied: None Treatment Notes Wound #4 (Right, Lateral Lower Leg) 1. Cleansed with: Clean wound with Normal Saline 2. Anesthetic Topical Lidocaine 4% cream to wound bed prior to debridement 3. Peri-wound Care: Barrier cream 4. Dressing Applied: Aquacel Ag 5. Secondary Dressing Applied ABD Pad 7. Secured with Rolena Infante to Left Lower Extremity Electronic Signature(s) Signed: 04/23/2017 12:24:00 PM By: Gretta Cool, BSN, RN, CWS, Kim RN, BSN Entered By: Gretta Cool, BSN, RN, CWS, Kim on 04/23/2017 08:36:30 Corey West ([id number]) -------------------------------------------------------------------------------- [place] Details Patient Name: ABBIE, JABLON A. Date of Service: 04/23/2017 8:00 AM Medical Record Number: [id number] Patient Account Number: [id number] Date of Birth/Sex: 08-Jan-1951 (66 y.o. Male) Treating RN: Cornell Barman Primary Care  Fleda Pagel: Lynett Fish Other Clinician: Referring Terren Jandreau: Myrtis Ser Treating Anaissa Macfadden/Extender: Ricard Dillon Weeks in Treatment: 0 Vital Signs Time Taken: 08:23 Temperature (F): 97.9 Height (in): 69 Pulse (bpm): 75 Weight (lbs): 305.4 Respiratory Rate (breaths/min): 16 Body Mass Index (BMI): 45.1 Blood Pressure (mmHg): 152/51 Reference Range: 80 - 120 mg / dl Electronic Signature(s) Signed: 04/23/2017 12:24:00 PM By: Gretta Cool, BSN, RN, CWS, Kim RN, BSN Entered By: Gretta Cool, BSN, RN, CWS, Kim on 04/23/2017 08:24:07

## 2017-04-24 NOTE — H&P (Signed)
Primary Care Physician:  Madelyn Brunner, MD Primary Gastroenterologist:  Dr. Vira Agar  Pre-Procedure History & Physical: HPI:  Corey West is a 66 y.o. male is here for an endoscopy.   Past Medical History:  Diagnosis Date  . Anemia   . Anxiety    controlled;   . Arthritis   . Barrett's esophagus   . Cirrhosis ([place])   . Depression    controlled;   Marland Kitchen Diabetes mellitus without complication ([place])    not controlled, taking insulin but sugar continues to run high;   Marland Kitchen GERD (gastroesophageal reflux disease)   . History of hiatal hernia   . Hypertension    controlled well;   Marland Kitchen Sleep apnea     Past Surgical History:  Procedure Laterality Date  . ESOPHAGOGASTRODUODENOSCOPY N/A 02/09/2015   Procedure: ESOPHAGOGASTRODUODENOSCOPY (EGD);  Surgeon: Manya Silvas, MD;  Location: [facility] ENDOSCOPY;  Service: Endoscopy;  Laterality: N/A;  . ESOPHAGOGASTRODUODENOSCOPY N/A 07/22/2015   Procedure: ESOPHAGOGASTRODUODENOSCOPY (EGD);  Surgeon: Manya Silvas, MD;  Location: [facility] ENDOSCOPY;  Service: Endoscopy;  Laterality: N/A;  . ESOPHAGOGASTRODUODENOSCOPY (EGD) WITH PROPOFOL N/A 07/20/2015   Procedure: ESOPHAGOGASTRODUODENOSCOPY (EGD) WITH PROPOFOL;  Surgeon: Manya Silvas, MD;  Location: [facility] ENDOSCOPY;  Service: Endoscopy;  Laterality: N/A;  . ESOPHAGOGASTRODUODENOSCOPY (EGD) WITH PROPOFOL N/A 09/16/2015   Procedure: ESOPHAGOGASTRODUODENOSCOPY (EGD) WITH PROPOFOL;  Surgeon: Manya Silvas, MD;  Location: [facility] ENDOSCOPY;  Service: Endoscopy;  Laterality: N/A;  . ESOPHAGOGASTRODUODENOSCOPY (EGD) WITH PROPOFOL N/A 03/16/2016   Procedure: ESOPHAGOGASTRODUODENOSCOPY (EGD) WITH PROPOFOL;  Surgeon: Manya Silvas, MD;  Location: [facility] ENDOSCOPY;  Service: Endoscopy;  Laterality: N/A;  . ESOPHAGOGASTRODUODENOSCOPY (EGD) WITH PROPOFOL N/A 09/14/2016   Procedure: ESOPHAGOGASTRODUODENOSCOPY (EGD) WITH PROPOFOL;  Surgeon: Manya Silvas, MD;  Location: [facility] ENDOSCOPY;  Service: Endoscopy;   Laterality: N/A;  . ESOPHAGOGASTRODUODENOSCOPY (EGD) WITH PROPOFOL N/A 11/05/2016   Procedure: ESOPHAGOGASTRODUODENOSCOPY (EGD) WITH PROPOFOL;  Surgeon: Manya Silvas, MD;  Location: [facility] ENDOSCOPY;  Service: Endoscopy;  Laterality: N/A;  . ESOPHAGOGASTRODUODENOSCOPY (EGD) WITH PROPOFOL N/A 02/06/2017   Procedure: ESOPHAGOGASTRODUODENOSCOPY (EGD) WITH PROPOFOL;  Surgeon: Manya Silvas, MD;  Location: [facility] ENDOSCOPY;  Service: Endoscopy;  Laterality: N/A;  . TONSILLECTOMY    . TONSILLECTOMY AND ADENOIDECTOMY    . ULNAR NERVE TRANSPOSITION    . UVULOPALATOPHARYNGOPLASTY      Prior to Admission medications   Medication Sig Start Date End Date Taking? Authorizing Provider  Ferrous Sulfate 140 (45 FE) MG TBCR Take 1 tablet by mouth daily.    Yes [provider]  furosemide (LASIX) 40 MG tablet Take 40 mg by mouth 2 (two) times daily.   Yes [provider]  insulin aspart protamine- aspart (NOVOLOG MIX 70/30) (70-30) 100 UNIT/ML injection Inject 0.5 mLs (50 Units total) into the skin 2 (two) times daily with a meal. Pt uses 84 units with breakfast and 62 units with supper. 02/22/17  Yes Fritzi Mandes, MD  lactulose (CHRONULAC) 10 GM/15ML solution Take 40 g by mouth at bedtime.    Yes [provider]  LORazepam (ATIVAN) 0.5 MG tablet Take 0.5 mg by mouth every 8 (eight) hours as needed for anxiety.   Yes [provider]  rifaximin (XIFAXAN) 550 MG TABS tablet Take 550 mg by mouth 2 (two) times daily.   Yes [provider]  spironolactone (ALDACTONE) 25 MG tablet Take 25 mg by mouth daily.   Yes [provider]  sucralfate (CARAFATE) 1 g tablet Take 1 g by mouth  4 (four) times daily -  with meals and at bedtime.   Yes [provider]  traMADol (ULTRAM) 50 MG tablet Take by mouth every 6 (six) hours as needed.    Yes [provider]  ammonium lactate (AMLACTIN) 12 % cream Apply topically 2 (two) times daily.    [provider]  amoxicillin-clavulanate (AUGMENTIN) 875-125 MG tablet Take 1 tablet by mouth every 12 (twelve) hours. Patient not taking: Reported on 04/24/2017 02/22/17   Fritzi Mandes, MD  omeprazole (PRILOSEC) 40 MG capsule Take 40 mg by mouth 2 (two) times daily.    [provider]    Allergies as of 04/19/2017  . (No Known Allergies)    Family History  Problem Relation Age of Onset  . Diabetes Other     Social History   Social History  . Marital status: Single    Spouse name: N/A  . Number of children: N/A  . Years of education: N/A   Occupational History  . Not on file.   Social History Main Topics  . Smoking status: Current Some Day Smoker    Types: Cigars  . Smokeless tobacco: Never Used     Comment: smoke 1 at night occasionally;   . Alcohol use Yes     Comment: stopped 12 years ago  . Drug use: No  . Sexual activity: Not on file   Other Topics Concern  . Not on file   Social History Narrative  . No narrative on file    Review of Systems: See HPI, otherwise negative ROS  Physical Exam: BP (!) 133/46   Pulse 74   Temp 99.4 F (37.4 C) (Tympanic)   Resp 20   Ht 5\' 10"  (1.778 m)   Wt (!) 138.3 kg (305 lb)   SpO2 100%   BMI 43.76 kg/m  General:   Alert,  pleasant and cooperative in NAD Head:  Normocephalic and atraumatic. Neck:  Supple; no masses or thyromegaly. Lungs:  Clear throughout to auscultation.    Heart:  Regular rate and rhythm. Abdomen:  Soft, nontender and nondistended. Normal bowel sounds, without guarding, and without rebound. Obese abdomen  Neurologic:  Alert and  oriented x4;  grossly normal neurologically.  Impression/Plan: [name] is here for an endoscopy to be performed for recurrent gastric bleeding polyps and anemia.  Risks, benefits, limitations, and alternatives regarding  endoscopy have been reviewed with the patient.  Questions have been answered.  All parties agreeable.   [name], MD  04/24/2017, 9:48 AM

## 2017-04-24 NOTE — Anesthesia Preprocedure Evaluation (Signed)
Anesthesia Evaluation  Patient identified by MRN, date of birth, ID band Patient awake    Reviewed: Allergy & Precautions, H&P , NPO status , Patient's Chart, lab work & pertinent test results, reviewed documented beta blocker date and time   History of Anesthesia Complications Negative for: history of anesthetic complications  Airway Mallampati: III  TM Distance: >3 FB Neck ROM: full    Dental  (+) Poor Dentition, Chipped   Pulmonary shortness of breath and with exertion, sleep apnea and Continuous Positive Airway Pressure Ventilation , Current Smoker, former smoker,    Pulmonary exam normal breath sounds clear to auscultation       Cardiovascular Exercise Tolerance: Good hypertension, (-) angina+ DOE  (-) CAD, (-) Past MI, (-) Cardiac Stents and (-) CABG Normal cardiovascular exam(-) dysrhythmias (-) Valvular Problems/Murmurs Rhythm:regular Rate:Normal     Neuro/Psych PSYCHIATRIC DISORDERS negative neurological ROS     GI/Hepatic negative GI ROS, Neg liver ROS, hiatal hernia, GERD  Medicated and Controlled,(+) Cirrhosis   Esophageal Varices    ,   Endo/Other  diabetes, Well Controlled, Type 2, Insulin DependentMorbid obesity  Renal/GU negative Renal ROS  negative genitourinary   Musculoskeletal  (+) Arthritis , Osteoarthritis,    Abdominal   Peds negative pediatric ROS (+)  Hematology negative hematology ROS (+) anemia ,   Anesthesia Other Findings Cirrhosis Esophageal varices Thrombocytopenia diabetes  Reproductive/Obstetrics negative OB ROS                             Anesthesia Physical  Anesthesia Plan  ASA: III  Anesthesia Plan: General   Post-op Pain Management:    Induction: Intravenous  PONV Risk Score and Plan:   Airway Management Planned: Nasal Cannula  Additional Equipment:   Intra-op Plan:   Post-operative Plan:   Informed Consent: I have reviewed the  patients History and Physical, chart, labs and discussed the procedure including the risks, benefits and alternatives for the proposed anesthesia with the patient or authorized representative who has indicated his/her understanding and acceptance.   Dental advisory given  Plan Discussed with: CRNA and Surgeon  Anesthesia Plan Comments:         Anesthesia Quick Evaluation

## 2017-04-24 NOTE — Progress Notes (Signed)
JASYN, MEY ([id number]) Visit Report for 04/23/2017 Abuse/Suicide Risk Screen Details Patient Name: Corey West, Corey West. Date of Service: 04/23/2017 8:00 AM Medical Record Number: [id number] Patient Account Number: [id number] Date of Birth/Sex: 28-Jul-1951 (66 y.o. Male) Treating RN: Cornell Barman Primary Care Angele Wiemann: Lynett Fish Other Clinician: Referring Lauralei Clouse: Myrtis Ser Treating Claressa Hughley/Extender: Ricard Dillon Weeks in Treatment: 0 Abuse/Suicide Risk Screen Items Answer ABUSE/SUICIDE RISK SCREEN: Has anyone close to you tried to hurt or harm you recentlyo No Do you feel uncomfortable with anyone in your familyo No Has anyone forced you do things that you didnot want to doo No Do you have any thoughts of harming yourselfo No Patient displays signs or symptoms of abuse and/or neglect. No Electronic Signature(s) Signed: 04/23/2017 12:24:00 PM By: Gretta Cool, BSN, RN, CWS, Kim RN, BSN Entered By: Gretta Cool, BSN, RN, CWS, Kim on 04/23/2017 08:46:10 Corey West ([id number]) -------------------------------------------------------------------------------- Activities of Daily Living Details Patient Name: Corey West, Corey West. Date of Service: 04/23/2017 8:00 AM Medical Record Number: [id number] Patient Account Number: [id number] Date of Birth/Sex: 11/29/1950 (66 y.o. Male) Treating RN: Cornell Barman Primary Care Poppy Mcafee: Lynett Fish Other Clinician: Referring Ovide Dusek: Myrtis Ser Treating Raedyn Wenke/Extender: Ricard Dillon Weeks in Treatment: 0 Activities of Daily Living Items Answer Activities of Daily Living (Please select one for each item) Drive Automobile Completely Able Take Medications Completely Able Use Telephone Completely Able Care for Appearance Completely Able Use Toilet Completely Able Bath / Shower Completely Able Dress Self Completely Able Feed Self Completely Able Walk Completely Able Get In / Out Bed Completely Able Housework Completely Able Prepare  Meals Completely [place] for Self Completely Able Electronic Signature(s) Signed: 04/23/2017 12:24:00 PM By: Gretta Cool, BSN, RN, CWS, Kim RN, BSN Entered By: Gretta Cool, BSN, RN, CWS, Kim on 04/23/2017 08:46:21 Corey West ([id number]) -------------------------------------------------------------------------------- Education Assessment Details Patient Name: Corey West, Corey A. Date of Service: 04/23/2017 8:00 AM Medical Record Number: [id number] Patient Account Number: [id number] Date of Birth/Sex: 07/22/1951 (66 y.o. Male) Treating RN: Cornell Barman Primary Care Raeghan Demeter: Lynett Fish Other Clinician: Referring Sharlot Sturkey: Myrtis Ser Treating Sahaj Bona/Extender: Tito Dine in Treatment: 0 Primary Learner Assessed: Patient Learning Preferences/Education Level/Primary Language Learning Preference: Explanation, Demonstration Highest Education Level: College or Above Preferred Language: English Cognitive Barrier Assessment/Beliefs Language Barrier: No Translator Needed: No Memory Deficit: No Emotional Barrier: No Cultural/Religious Beliefs Affecting Medical No Care: Physical Barrier Assessment Impaired Vision: Yes Glasses Impaired Hearing: No Decreased Hand dexterity: No Knowledge/Comprehension Assessment Knowledge Level: High Comprehension Level: High Ability to understand written High instructions: Ability to understand verbal High instructions: Motivation Assessment Anxiety Level: Calm Cooperation: Cooperative Education Importance: Acknowledges Need Interest in Health Problems: Asks Questions Perception: Coherent Willingness to Engage in Self- High Management Activities: Readiness to Engage in Self- High Management Activities: Electronic Signature(s) STACE, PEACE ([id number]) Signed: 04/23/2017 12:24:00 PM By: Gretta Cool, BSN, RN, CWS, Kim RN, BSN Entered By: Gretta Cool, BSN, RN, CWS, Kim on 04/23/2017 08:46:56 Corey West  ([id number]) -------------------------------------------------------------------------------- Fall Risk Assessment Details Patient Name: Corey West Date of Service: 04/23/2017 8:00 AM Medical Record Number: [id number] Patient Account Number: [id number] Date of Birth/Sex: October 14, 1950 (66 y.o. Male) Treating RN: Cornell Barman Primary Care Willona Phariss: Lynett Fish Other Clinician: Referring Gaylin Bulthuis: Myrtis Ser Treating Caelen Reierson/Extender: Ricard Dillon Weeks in Treatment: 0 Fall Risk Assessment Items Have you had 2 or more falls in the last 12 monthso 0 Yes Have you had any fall that resulted in injury in the  last 12 monthso 0 No FALL RISK ASSESSMENT: History of falling - immediate or within 3 months 0 No Secondary diagnosis 0 No Ambulatory aid None/bed rest/wheelchair/nurse 0 Yes Crutches/cane/walker 0 No Furniture 0 No IV Access/Saline Lock 0 No Gait/Training Normal/bed rest/immobile 0 Yes Weak 0 No Impaired 0 No Mental Status Oriented to own ability 0 Yes Electronic Signature(s) Signed: 04/23/2017 12:24:00 PM By: Gretta Cool, BSN, RN, CWS, Kim RN, BSN Entered By: Gretta Cool, BSN, RN, CWS, Kim on 04/23/2017 08:47:14 Corey West ([id number]) -------------------------------------------------------------------------------- Foot Assessment Details Patient Name: Corey West, Corey A. Date of Service: 04/23/2017 8:00 AM Medical Record Number: [id number] Patient Account Number: [id number] Date of Birth/Sex: [birth year] (66 y.o. Male) Treating RN: Cornell Barman Primary Care Sakai Heinle: Lynett Fish Other Clinician: Referring Jasmain Ahlberg: Myrtis Ser Treating Zenita Kister/Extender: Ricard Dillon Weeks in Treatment: 0 Foot Assessment Items Site Locations + = Sensation present, - = Sensation absent, C = Callus, U = Ulcer R = Redness, W = Warmth, M = Maceration, PU = Pre-ulcerative lesion F = Fissure, S = Swelling, D = Dryness Assessment Right: Left: Other Deformity: No No Prior Foot  Ulcer: No No Prior Amputation: No No Charcot Joint: No No Ambulatory Status: Ambulatory Without Help Gait: Steady Electronic Signature(s) Signed: 04/23/2017 12:24:00 PM By: Gretta Cool, BSN, RN, CWS, Kim RN, BSN Entered By: Gretta Cool, BSN, RN, CWS, Kim on 04/23/2017 08:49:03 Corey West ([id number]) -------------------------------------------------------------------------------- Nutrition Risk Assessment Details Patient Name: Corey West, Corey A. Date of Service: 04/23/2017 8:00 AM Medical Record Number: [id number] Patient Account Number: [id number] Date of Birth/Sex: 12/17/1950 (66 y.o. Male) Treating RN: Cornell Barman Primary Care Stina Gane: Lynett Fish Other Clinician: Referring Kenroy Timberman: Myrtis Ser Treating Ruford Dudzinski/Extender: Ricard Dillon Weeks in Treatment: 0 Height (in): 69 Weight (lbs): 305.4 Body Mass Index (BMI): 45.1 Nutrition Risk Assessment Items NUTRITION RISK SCREEN: I have an illness or condition that made me change the kind and/or 0 No amount of food I eat I eat fewer than two meals per day 0 No I eat few fruits and vegetables, or milk products 0 No I have three or more drinks of beer, liquor or wine almost every day 0 No I have tooth or mouth problems that make it hard for me to eat 0 No I don't always have enough money to buy the food I need 0 No I eat alone most of the time 0 No I take three or more different prescribed or over-the-counter drugs a 1 Yes day Without wanting to, I have lost or gained 10 pounds in the last six 0 No months I am not always physically able to shop, cook and/or feed myself 0 No Nutrition Protocols Good Risk Protocol Provide education on elevated blood sugars Moderate Risk Protocol 0 and impact on wound healing, as applicable Electronic Signature(s) Signed: 04/23/2017 12:24:00 PM By: Gretta Cool, BSN, RN, CWS, Kim RN, BSN Entered By: Gretta Cool, BSN, RN, CWS, Kim on 04/23/2017 08:47:41

## 2017-04-24 NOTE — Transfer of Care (Signed)
Immediate Anesthesia Transfer of Care Note  Patient: [name]  Procedure(s) Performed: Procedure(s): ESOPHAGOGASTRODUODENOSCOPY (EGD) WITH PROPOFOL (N/A)  Patient Location: Endoscopy Unit  Anesthesia Type:General  Level of Consciousness: drowsy and patient cooperative  Airway & Oxygen Therapy: Patient Spontanous Breathing and Patient connected to nasal cannula oxygen  Post-op Assessment: Report given to RN and Post -op Vital signs reviewed and stable  Post vital signs: Reviewed and stable  Last Vitals:  Vitals:   04/24/17 0813 04/24/17 1012  BP: (!) 133/46 (!) 130/50  Pulse: 74 71  Resp: 20 20  Temp: 37.4 C 37.5 C    Last Pain:  Vitals:   04/24/17 1012  TempSrc: Tympanic         Complications: No apparent anesthesia complications

## 2017-04-24 NOTE — Anesthesia Post-op Follow-up Note (Cosign Needed)
Anesthesia QCDR form completed.        

## 2017-04-24 NOTE — Anesthesia Postprocedure Evaluation (Signed)
Anesthesia Post Note  Patient: Corey West  Procedure(s) Performed: Procedure(s) (LRB): ESOPHAGOGASTRODUODENOSCOPY (EGD) WITH PROPOFOL (N/A)  Patient location during evaluation: Endoscopy Anesthesia Type: General Level of consciousness: awake and alert Pain management: pain level controlled Vital Signs Assessment: post-procedure vital signs reviewed and stable Respiratory status: spontaneous breathing, nonlabored ventilation, respiratory function stable and patient connected to nasal cannula oxygen Cardiovascular status: blood pressure returned to baseline and stable Postop Assessment: no signs of nausea or vomiting Anesthetic complications: no     Last Vitals:  Vitals:   04/24/17 1042 04/24/17 1052  BP: (!) 153/60 (!) 157/63  Pulse: 69 70  Resp: 12 20  Temp:      Last Pain:  Vitals:   04/24/17 1012  TempSrc: Tympanic                 Martha Clan

## 2017-04-24 NOTE — Progress Notes (Signed)
BEAUDEN, TREMONT ([id number]) Visit Report for 04/23/2017 Chief Complaint Document Details Patient Name: Corey West, Corey West. Date of Service: 04/23/2017 8:00 AM Medical Record Number: [id number] Patient Account Number: [id number] Date of Birth/Sex: January 20, 1951 (66 y.o. Male) Treating RN: Montey Hora Primary Care Provider: Lynett Fish Other Clinician: Referring Provider: Myrtis Ser Treating Provider/Extender: Ricard Dillon Weeks in Treatment: 0 Information Obtained from: Patient Chief Complaint LLE VSU since Jan 2015 04/23/17; patient is here for review of wounds on his right lateral and right posterior calf Electronic Signature(s) Signed: 04/23/2017 5:23:04 PM By: Linton Ham MD Entered By: Linton Ham on 04/23/2017 09:24:21 Corey West ([id number]) -------------------------------------------------------------------------------- HPI Details Patient Name: Corey West Date of Service: 04/23/2017 8:00 AM Medical Record Number: [id number] Patient Account Number: [id number] Date of Birth/Sex: 03/22/1951 (66 y.o. Male) Treating RN: Montey Hora Primary Care Provider: Lynett Fish Other Clinician: Referring Provider: Myrtis Ser Treating Provider/Extender: Tito Dine in Treatment: 0 History of Present Illness HPI Description: Pleasant 66 year old with West past medical history significant for morbid obesity, insulin- dependent type 2 diabetes (hemoglobin A1c 7.2 in January 2015), cirrhosis, and chronic kidney disease. He reports West left anterior calf ulcer since January 2015. He has had venous stasis ulcers in the past, which have healed spontaneously. No history of DVT. Does not regularly wear compression. Ambulating normally per his baseline. Recently completed West course of antibiotics. Treated with West Profore light and Coban 2 layer compression drainage, which both fell down and he cut off. Tolerated Coban lite compression bandage. Returns to clinic  for followup. No complaints today. No pain at this time. No fever or chills. No open wounds at this time. He is having difficulty finding compression garments that fit him. 04/23/17; this is now 66 year old man who returns to our clinic for nonhealing wounds on the right lateral and right posterior calf. He tells me that they have been present for 6 months. In the setting of severe right greater than left lower extremity lymphedema likely secondary to chronic venous insufficiency. He has been followed in the lymphedema clinic at least since early June with compression wraps although these have not really helped and the patient is here for our review of this situation. The patient has had problems with edema in his legs for years. He has not worn compression. He is type II diabetic that sounds poorly controlled. He also has obstructive sleep apnea and cirrhosis. He is occasional cigar smoker Engineer, maintenance) Signed: 04/23/2017 5:23:04 PM By: Linton Ham MD Entered By: Linton Ham on 04/23/2017 09:27:10 Corey West ([id number]) -------------------------------------------------------------------------------- Physical Exam Details Patient Name: Corey, RADEL West. Date of Service: 04/23/2017 8:00 AM Medical Record Number: [id number] Patient Account Number: [id number] Date of Birth/Sex: 04/26/1951 (66 y.o. Male) Treating RN: Montey Hora Primary Care Provider: Lynett Fish Other Clinician: Referring Provider: Myrtis Ser Treating Provider/Extender: Ricard Dillon Weeks in Treatment: 0 Constitutional Patient is hypertensive.. Pulse regular and within target range for patient.Marland Kitchen Respirations regular, non-labored and within target range.. Temperature is normal and within the target range for the patient.Marland Kitchen appears in no distress. Eyes Conjunctivae clear. No discharge. No scleral icterus. Respiratory Respiratory effort is easy and symmetric bilaterally. Rate is normal at rest  and on room air.. Bilateral breath sounds are clear and equal in all lobes with no wheezes, rales or rhonchi.. Cardiovascular Heart rhythm and rate regular, without murmur or gallop. JVP is not elevated. Femoral arteries without bruits and pulses strong.. Even through his edema his  peripheral pulses are palpable dorsalis pedis and posterior tibial. The patient has severe bilateral right greater than left lymphedema. There is some signs of chronic venous insufficiency with inflammation. Around his ankles there is West lot less edema probably secondary to underlying fibrosis. Gastrointestinal (GI) . Lymphatic None palpable in the popliteal or inguinal area. Psychiatric No evidence of depression, anxiety, or agitation. Calm, cooperative, and communicative. Appropriate interactions and affect.. Notes Wound exam; the patient has West quarter-sized punched out area on the right lateral calf. More problematically on the right posterior calf West large area of denuded surface epithelium with weeping edema fluid. There is no evidence of surrounding infection in either one of these areas. He has stage III lymphedema of the right greater than left leg. There is no open areas on the left leg. Electronic Signature(s) Signed: 04/23/2017 5:23:04 PM By: Linton Ham MD Entered By: Linton Ham on 04/23/2017 09:31:27 Corey West ([id number]) -------------------------------------------------------------------------------- Physician Orders Details Patient Name: Corey, HEIGL West. Date of Service: 04/23/2017 8:00 AM Medical Record Number: [id number] Patient Account Number: [id number] Date of Birth/Sex: 1950-11-13 (66 y.o. Male) Treating RN: Cornell Barman Primary Care Provider: Lynett Fish Other Clinician: Referring Provider: Myrtis Ser Treating Provider/Extender: Ricard Dillon Weeks in Treatment: 0 Verbal / Phone Orders: No Diagnosis Coding Wound Cleansing Wound #3 Right Lower Leg o May  Shower, gently pat wound dry prior to applying new dressing. Wound #4 Right,Lateral Lower Leg o May Shower, gently pat wound dry prior to applying new dressing. Skin Barriers/Peri-Wound Care Wound #3 Right Lower Leg o Barrier cream Wound #4 Right,Lateral Lower Leg o Barrier cream Primary Wound Dressing Wound #3 Right Lower Leg o Aquacel Ag Wound #4 Right,Lateral Lower Leg o Aquacel Ag Secondary Dressing Wound #3 Right Lower Leg o ABD pad Wound #4 Right,Lateral Lower Leg o ABD pad Dressing Change Frequency Wound #3 Right Lower Leg o Change dressing every week Wound #4 Right,Lateral Lower Leg o Change dressing every week Follow-up Appointments CLARON, ROSENCRANS ([id number]) Wound #3 Right Lower Leg o Return Appointment in 1 week. Wound #4 Right,Lateral Lower Leg o Return Appointment in 1 week. Edema Control Wound #3 Right Lower Leg o Unna Boot to Right Lower Extremity o Compression Pump: Use compression pump on left lower extremity for 30 minutes, twice daily. Wound #4 Right,Lateral Lower Leg o Unna Boot to Right Lower Extremity o Compression Pump: Use compression pump on left lower extremity for 30 minutes, twice daily. Additional Orders / Instructions Wound #3 Right Lower Leg o Increase protein intake. o Other: - Get Blood Sugars below 150 Wound #4 Right,Lateral Lower Leg o Increase protein intake. o Other: - Get Blood Sugars below 150 Electronic Signature(s) Signed: 04/23/2017 12:24:00 PM By: Gretta Cool, BSN, RN, CWS, Kim RN, BSN Signed: 04/23/2017 5:23:04 PM By: Linton Ham MD Entered By: Gretta Cool, BSN, RN, CWS, Kim on 04/23/2017 09:05:04 LEANORD, THIBEAU ([id number]) -------------------------------------------------------------------------------- Problem List Details Patient Name: WEN, MERCED. Date of Service: 04/23/2017 8:00 AM Medical Record Number: [id number] Patient Account Number: [id number] Date of Birth/Sex: 1951-05-30 (66 y.o.  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 Date of Service: 04/23/2017 8:00 AM Medical Record Number: [id number] Patient Account Number: [id number] Date of Birth/Sex: 03-08-1951 (66 y.o. Male) Treating RN: Montey Hora Primary Care Provider: Lynett Fish Other Clinician: Referring Provider: Myrtis Ser Treating Provider/Extender: Ricard Dillon Weeks in Treatment: 0 Subjective Chief Complaint Information obtained from Patient LLE VSU since Jan 2015 04/23/17; patient is here for review of wounds on his right lateral and right posterior calf History of Present Illness (HPI) Pleasant 66 year old with West past medical history significant for morbid obesity, insulin-dependent type 2 diabetes (hemoglobin A1c 7.2 in January 2015), cirrhosis, and chronic kidney disease. He reports West left anterior calf ulcer since January 2015. He has had venous stasis ulcers in the past, which have healed spontaneously. No history of DVT. Does not regularly wear compression. Ambulating normally per his baseline. Recently  completed West course of antibiotics. Treated with West Profore light and Coban 2 layer compression drainage, which both fell down and he cut off. Tolerated Coban lite compression bandage. Returns to clinic for followup. No complaints today. No pain at this time. No fever or chills. No open wounds at this time. He is having difficulty finding compression garments that fit him. 04/23/17; this is now 66 year old man who returns to our clinic for nonhealing wounds on the right lateral and right posterior calf. He tells me that they have been present for 6 months. In the setting of severe right greater than left lower extremity lymphedema likely secondary to chronic venous insufficiency. He has been followed in the lymphedema clinic at least since early June with compression wraps although these have not really helped and the patient is here for our review of this situation. The patient has had problems with edema in his legs for years. He has not worn compression. He is type II diabetic that sounds poorly controlled. He also has obstructive sleep apnea and cirrhosis. He is occasional cigar smoker Wound History Patient presents with 2 open wounds that have been present for approximately 6 months. Patient has been treating wounds in the following manner: nothing. Laboratory tests have not been performed in the last month. Patient reportedly has not tested positive for an antibiotic resistant organism. Patient reportedly has not tested positive for osteomyelitis. Patient reportedly has not had testing performed to evaluate circulation in the legs. Patient History Information obtained from Patient. WYLEY, HACK ([id number]) Allergies No Known Drug Allergies Family History Diabetes - Mother, Heart Disease - Father, Hypertension - Mother, Seizures - Father, No family history of Cancer, Kidney Disease, Lung Disease, Stroke, Thyroid Problems. Social History Light tobacco smoker, Alcohol Use - Never, Drug  Use - No History, Caffeine Use - Moderate. Medical History Eyes Denies history of Glaucoma Ear/Nose/Mouth/Throat Denies history of Chronic sinus problems/congestion, Middle ear problems Hematologic/Lymphatic Denies history of Hemophilia, Human Immunodeficiency Virus, Lymphedema, Sickle Cell Disease Respiratory Denies history of Aspiration, Asthma, Chronic Obstructive Pulmonary Disease (COPD), Pneumothorax, Tuberculosis Cardiovascular Denies history of Angina, Arrhythmia, Congestive Heart Failure, Coronary Artery Disease, Deep Vein Thrombosis, Hypotension, Myocardial Infarction, Peripheral Arterial Disease, Peripheral Venous Disease, Phlebitis, Vasculitis Gastrointestinal Denies history of Colitis, Crohn s, Hepatitis West, Hepatitis B, Hepatitis C Endocrine Patient has history of Type II Diabetes Denies history of Type I Diabetes Genitourinary Denies history of End  Stage Renal Disease Immunological Denies history of Lupus Erythematosus, Raynaud s, Scleroderma Integumentary (Skin) Denies history of History of Burn, History of pressure wounds Musculoskeletal Denies history of Gout, Rheumatoid Arthritis, Osteoarthritis, Osteomyelitis Neurologic Denies history of Dementia, Neuropathy, Quadriplegia, Paraplegia, Seizure Disorder Oncologic Denies history of Received Chemotherapy, Received Radiation Psychiatric Denies history of Anorexia/bulimia, Confinement Anxiety Patient is treated with Insulin. Blood sugar is tested. Blood sugar results noted at the following times: Breakfast - 280. Hospitalization/Surgery History - 10/28/2013, [facility], passed out and fell. Medical And Surgical History Notes MATEJ, SAPPENFIELD ([id number]) Constitutional Symptoms (General Health) high bood pressure, liver function decline, diabetic, Cirrhosis of liver Genitourinary antibiotics for kidney infection Review of Systems (ROS) Eyes The patient has no complaints or symptoms. Ear/Nose/Mouth/Throat The patient  has no complaints or symptoms. Hematologic/Lymphatic The patient has no complaints or symptoms. Respiratory The patient has no complaints or symptoms. Cardiovascular The patient has no complaints or symptoms. Gastrointestinal The patient has no complaints or symptoms. Endocrine The patient has no complaints or symptoms. Genitourinary The patient has no complaints or symptoms. Immunological The patient has no complaints or symptoms. Integumentary (Skin) Complains or has symptoms of Wounds, Bleeding or bruising tendency. Denies complaints or symptoms of Breakdown, Swelling. Musculoskeletal Denies complaints or symptoms of Muscle Pain, Muscle Weakness. Neurologic The patient has no complaints or symptoms. Oncologic The patient has no complaints or symptoms. Psychiatric The patient has no complaints or symptoms. Objective Constitutional Patient is hypertensive.. Pulse regular and within target range for patient.Marland Kitchen Respirations regular, non-labored and within target range.. Temperature is normal and within the target range for the patient.Marland Kitchen appears in no distress. ISIAAH, CUERVO ([id number]) Vitals Time Taken: 8:23 AM, Height: 69 in, Weight: 305.4 lbs, BMI: 45.1, Temperature: 97.9 F, Pulse: 75 bpm, Respiratory Rate: 16 breaths/min, Blood Pressure: 152/51 mmHg. Eyes Conjunctivae clear. No discharge. No scleral icterus. Respiratory Respiratory effort is easy and symmetric bilaterally. Rate is normal at rest and on room air.. Bilateral breath sounds are clear and equal in all lobes with no wheezes, rales or rhonchi.. Cardiovascular Heart rhythm and rate regular, without murmur or gallop. JVP is not elevated. Femoral arteries without bruits and pulses strong.. Even through his edema his peripheral pulses are palpable dorsalis pedis and posterior tibial. The patient has severe bilateral right greater than left lymphedema. There is some signs of chronic venous insufficiency with  inflammation. Around his ankles there is West lot less edema probably secondary to underlying fibrosis. Lymphatic None palpable in the popliteal or inguinal area. Psychiatric No evidence of depression, anxiety, or agitation. Calm, cooperative, and communicative. Appropriate interactions and affect.. General Notes: Wound exam; the patient has West quarter-sized punched out area on the right lateral calf. More problematically on the right posterior calf West large area of denuded surface epithelium with weeping edema fluid. There is no evidence of surrounding infection in either one of these areas. He has stage III lymphedema of the right greater than left leg. There is no open areas on the left leg. Integumentary (Hair, Skin) Wound #3 status is Open. Original cause of wound was Gradually Appeared. The wound is located on the Right Lower Leg. The wound measures 7cm length x 7cm width x 1cm depth; 38.485cm^2 area and 38.485cm^3 volume. There is no tunneling or undermining noted. There is West large amount of serous drainage noted. The wound margin is indistinct and nonvisible. There is no granulation within the wound bed. There is no necrotic tissue within the wound bed. The periwound skin appearance  exhibited: Induration, Hemosiderin Staining. The periwound skin appearance did not exhibit: Callus, Crepitus, Excoriation, Rash, Scarring, Dry/Scaly, Maceration, Atrophie Blanche, Cyanosis, Ecchymosis, Mottled, Pallor, Rubor, Erythema. Periwound temperature was noted as No Abnormality. The periwound has tenderness on palpation. Wound #4 status is Open. Original cause of wound was Gradually Appeared. The wound is located on the Right,Lateral Lower Leg. The wound measures 1cm length x 1.2cm width x 0.2cm depth; 0.942cm^2 area and 0.188cm^3 volume. There is Fat Layer (Subcutaneous Tissue) Exposed exposed. There is no tunneling or undermining noted. There is West large amount of serous drainage noted. The wound margin  is flat and intact. There is small (1-33%) pink, pale granulation within the wound bed. There is West medium (34-66%) amount of necrotic tissue within the wound bed including Adherent Slough. The periwound skin appearance exhibited: Erythema. The surrounding wound skin color is noted with erythema which is circumferential. AKBAR, SACRA ([id number]) Assessment Active Problems ICD-10 [phone] - Non-pressure chronic ulcer of right calf limited to breakdown of skin I89.0 - Lymphedema, not elsewhere classified I87.331 - Chronic venous hypertension (idiopathic) with ulcer and inflammation of right lower extremity E11.622 - Type 2 diabetes mellitus with other skin ulcer Procedures Wound #3 Pre-procedure diagnosis of Wound #3 is West Lymphedema located on the Right Lower Leg . There was West Haematologist Compression Therapy Procedure by Cornell Barman, RN. Post procedure Diagnosis Wound #3: Same as Pre-Procedure Plan Wound Cleansing: Wound #3 Right Lower Leg: May Shower, gently pat wound dry prior to applying new dressing. Wound #4 Right,Lateral Lower Leg: May Shower, gently pat wound dry prior to applying new dressing. Skin Barriers/Peri-Wound Care: Wound #3 Right Lower Leg: Barrier cream Wound #4 Right,Lateral Lower Leg: Barrier cream Primary Wound Dressing: Wound #3 Right Lower Leg: Aquacel Ag Wound #4 Right,Lateral Lower Leg: Aquacel Ag Secondary Dressing: Wound #3 Right Lower Leg: ABD pad QUAMERE, MUSSELL ([id number]) Wound #4 Right,Lateral Lower Leg: ABD pad Dressing Change Frequency: Wound #3 Right Lower Leg: Change dressing every week Wound #4 Right,Lateral Lower Leg: Change dressing every week Follow-up Appointments: Wound #3 Right Lower Leg: Return Appointment in 1 week. Wound #4 Right,Lateral Lower Leg: Return Appointment in 1 week. Edema Control: Wound #3 Right Lower Leg: Unna Boot to Right Lower Extremity Compression Pump: Use compression pump on left lower extremity for 30  minutes, twice daily. Wound #4 Right,Lateral Lower Leg: Unna Boot to Right Lower Extremity Compression Pump: Use compression pump on left lower extremity for 30 minutes, twice daily. Additional Orders / Instructions: Wound #3 Right Lower Leg: Increase protein intake. Other: - Get Blood Sugars below 150 Wound #4 Right,Lateral Lower Leg: Increase protein intake. Other: - Get Blood Sugars below 150 The patient has wounds on the right leg in the setting of stage III lymphedema which is secondary to chronic venous hypertension and inflammation. He will need Aquacel Ag, ABDs and we put him in [employer] I had West fairly extensive discussion with him about ongoing compression. He does not want to go back to the lymphedema clinic. He is going to need some form of compression stocking for his lower extremities. 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 He tells me that he could not previously get stockings on. Then there is the size of his leg issue with regards to wrap around type stockings. In any case we will need to find some solution to this. It does not appear that he has had 6 months where the compression at this point and  therefore probably will not qualify for external compression pumps at least now This patient was seen in this clinic in 2015 but not at the time clinic was under current management. Nor was he seen by any of the current providers. This will qualify him as West new patient IBRAHEM, VOLKMAN ([id number]) Electronic Signature(s) Signed: 04/23/2017 5:23:04 PM By: Linton Ham MD Entered By: Linton Ham on 04/23/2017 09:33:48 Corey West ([id number]) -------------------------------------------------------------------------------- ROS/PFSH Details Patient Name: KARLA, VINES West. Date of Service: 04/23/2017 8:00 AM Medical Record Number: [id number] Patient Account Number: [id number] Date of Birth/Sex: 09/23/1951 (66 y.o. Male) Treating RN: Cornell Barman Primary  Care Provider: Lynett Fish Other Clinician: Referring Provider: Myrtis Ser Treating Provider/Extender: Ricard Dillon Weeks in Treatment: 0 Information Obtained From Patient Wound History Do you currently have one or more open woundso Yes How many open wounds do you currently haveo 2 Approximately how long have you had your woundso 6 months How have you been treating your wound(s) until nowo nothing Has your wound(s) ever healed and then re-openedo No Have you had any lab work done in the past montho No Have you tested positive for an antibiotic resistant organism (MRSA, VRE)o No Have you tested positive for osteomyelitis (bone infection)o No Have you had any tests for circulation on your legso No Eyes Complaints and Symptoms: No Complaints or Symptoms Complaints and Symptoms: Negative for: Dry Eyes; Vision Changes Medical History: Positive for: Cataracts Negative for: Glaucoma Integumentary (Skin) Complaints and Symptoms: Positive for: Wounds; Bleeding or bruising tendency Negative for: Breakdown; Swelling Medical History: Negative for: History of Burn; History of pressure wounds Musculoskeletal Complaints and Symptoms: Negative for: Muscle Pain; Muscle Weakness Medical History: Negative for: Gout; Rheumatoid Arthritis; Osteoarthritis; Osteomyelitis MITHCELL, SCHUMPERT ([id number]) Constitutional Symptoms (General Health) Medical History: Past Medical History Notes: high bood pressure, liver function decline, diabetic, Cirrhosis of liver Ear/Nose/Mouth/Throat Complaints and Symptoms: No Complaints or Symptoms Medical History: Negative for: Chronic sinus problems/congestion; Middle ear problems Hematologic/Lymphatic Complaints and Symptoms: No Complaints or Symptoms Medical History: Positive for: Anemia Negative for: Hemophilia; Human Immunodeficiency Virus; Lymphedema; Sickle Cell Disease Respiratory Complaints and Symptoms: No Complaints or  Symptoms Medical History: Positive for: Sleep Apnea Negative for: Aspiration; Asthma; Chronic Obstructive Pulmonary Disease (COPD); Pneumothorax; Tuberculosis Cardiovascular Complaints and Symptoms: No Complaints or Symptoms Medical History: Positive for: Hypertension Negative for: Angina; Arrhythmia; Congestive Heart Failure; Coronary Artery Disease; Deep Vein Thrombosis; Hypotension; Myocardial Infarction; Peripheral Arterial Disease; Peripheral Venous Disease; Phlebitis; Vasculitis Gastrointestinal Complaints and Symptoms: No Complaints or Symptoms Medical History: Positive for: Cirrhosis DAYMEN, HASSEBROCK ([id number]) Negative for: Colitis; Crohnos; Hepatitis West; Hepatitis B; Hepatitis C Endocrine Complaints and Symptoms: No Complaints or Symptoms Medical History: Positive for: Type II Diabetes Negative for: Type I Diabetes Time with diabetes: 10 years Treated with: Insulin Blood sugar tested every day: Yes Tested : Blood sugar testing results: Breakfast: 280 Genitourinary Complaints and Symptoms: No Complaints or Symptoms Medical History: Negative for: End Stage Renal Disease Past Medical History Notes: antibiotics for kidney infection Immunological Complaints and Symptoms: No Complaints or Symptoms Medical History: Negative for: Lupus Erythematosus; Raynaudos; Scleroderma Neurologic Complaints and Symptoms: No Complaints or Symptoms Medical History: Negative for: Dementia; Neuropathy; Quadriplegia; Paraplegia; Seizure Disorder Oncologic Complaints and Symptoms: No Complaints or Symptoms Medical History: Negative for: Received Chemotherapy; Received Radiation HANSEL, DEVAN ([id number]) Psychiatric Complaints and Symptoms: No Complaints or Symptoms Medical History: Negative for: Anorexia/bulimia; Confinement Anxiety HBO Extended History Items Eyes: Cataracts Immunizations Pneumococcal Vaccine: Received Pneumococcal Vaccination:  Yes Hospitalization /  Surgery History Name of Hospital Purpose of Hospitalization/Surgery Date [place] passed out and fell 10/28/2013 Family and Social History Cancer: No; Diabetes: Yes - Mother; Heart Disease: Yes - Father; Hypertension: Yes - Mother; Kidney Disease: No; Lung Disease: No; Seizures: Yes - Father; Stroke: No; Thyroid Problems: No; Light tobacco smoker; Alcohol Use: Never; Drug Use: No History; Caffeine Use: Moderate; Financial Concerns: No; Food, Clothing or Shelter Needs: No; Support System Lacking: No; Transportation Concerns: No; Advanced Directives: No; Patient does not want information on Advanced Directives; Do not resuscitate: No; Living Will: No; Medical Power of Attorney: No Electronic Signature(s) Signed: 04/23/2017 12:24:00 PM By: Gretta Cool, BSN, RN, CWS, Kim RN, BSN Signed: 04/23/2017 5:23:04 PM By: Linton Ham MD Entered By: Gretta Cool, BSN, RN, CWS, Kim on 04/23/2017 08:46:00 RAFIQ, BUCKLIN ([id number]) -------------------------------------------------------------------------------- [place] Details Patient Name: EMRAN, MOLZAHN West. Date of Service: 04/23/2017 Medical Record Number: [id number] Patient Account Number: [id number] Date of Birth/Sex: [birth year] (66 y.o. 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 I87.331 extremity E11.622 Type 2 diabetes mellitus with other skin ulcer Facility Procedures CPT4 Code: 17001749 Description: [place] VISIT-LEV 3 EST PT Modifier: Quantity: 1 CPT4 Code: [id number] Description: (Facility Use Only) [id number] - APPLY Louretta Parma BOOT RT Modifier: Quantity:  1 Physician Procedures CPT4 Code Description: 9935701 WC PHYS LEVEL 3 o NEW PT ICD-10 Description Diagnosis L97.211 Non-pressure chronic ulcer of right calf limited t I89.0 Lymphedema, not elsewhere classified Modifier: o breakdown of Quantity: 1 skin Electronic Signature(s) Signed: 04/23/2017 5:23:04 PM By: Linton Ham MD Entered By: Linton Ham on 04/23/2017 09:35:38

## 2017-04-24 NOTE — Op Note (Signed)
[facility] Gastroenterology Patient Name: Corey West Procedure Date: 04/24/2017 9:47 AM MRN: [id number] Account #: [id number] Date of Birth: 1950-10-28 Admit Type: Outpatient Age: 66 Room: [facility] ENDO ROOM 3 Gender: Male Note Status: Finalized Procedure:            Upper GI endoscopy Indications:          Suspected upper gastrointestinal bleeding, Suspected                        upper gastrointestinal bleeding in patient with chronic                        blood loss Providers:            Manya Silvas, MD Referring MD:         Hewitt Blade. Sarina Ser, MD (Referring MD) Medicines:            Propofol per Anesthesia Complications:        No immediate complications. Procedure:            Pre-Anesthesia Assessment:                       - After reviewing the risks and benefits, the patient                        was deemed in satisfactory condition to undergo the                        procedure.                       After obtaining informed consent, the endoscope was                        passed under direct vision. Throughout the procedure,                        the patient's blood pressure, pulse, and oxygen                        saturations were monitored continuously. The Endoscope                        was introduced through the mouth, and advanced to the                        second part of duodenum. The upper GI endoscopy was                        accomplished without difficulty. The patient tolerated                        the procedure well. The upper GI endoscopy was                        accomplished without difficulty. The patient tolerated                        the procedure well. Findings:      LA Grade B (one or more mucosal breaks greater than 5 mm, not extending  between the tops of two mucosal folds) esophagitis with no bleeding was       found 35 cm from the incisors. Some Barretts esophagus may be forming in       the gastro  esophageal area.      Multiple small sessile polyps with no bleeding and no stigmata of recent       bleeding were found in the gastric antrum. No bleeding from any polyps.      A single diminutive stigmata of recent bleeding angioectasia was found       on the posterior wall of the stomach. Coagulation for tissue destruction       using argon plasma at 0.6 liters/minute and 20 watts was successful.      Diffuse moderately erythematous mucosa without active bleeding and with       no stigmata of bleeding was found in the duodenal bulb.      The exam was otherwise without abnormality. Impression:           - LA Grade B reflux esophagitis. Rule out Barrett's                        esophagus.                       - Multiple gastric polyps.                       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 Treated with argon plasma coagulation (APC).                       - Erythematous duodenopathy.                       - The examination was otherwise normal.                       - No specimens collected. Recommendation:       - The findings and recommendations were discussed with                        the patient's family. Manya Silvas, MD 04/24/2017 10:10:18 AM This report has been signed electronically. Number of Addenda: 0 Note Initiated On: 04/24/2017 9:47 AM      [facility]

## 2017-04-25 ENCOUNTER — Ambulatory Visit: Payer: Medicare Other | Admitting: Occupational Therapy

## 2017-04-25 ENCOUNTER — Encounter: Payer: Self-pay | Admitting: Unknown Physician Specialty

## 2017-04-30 ENCOUNTER — Encounter: Payer: Medicare Other | Admitting: Internal Medicine

## 2017-04-30 DIAGNOSIS — E11622 Type 2 diabetes mellitus with other skin ulcer: Secondary | ICD-10-CM | POA: Diagnosis not present

## 2017-05-01 NOTE — Progress Notes (Signed)
Corey, West ([id number]) Visit Report for 04/30/2017 Arrival Information Details Patient Name: Corey West, Corey West. Date of Service: 04/30/2017 11:00 AM Medical Record Number: [id number] Patient Account Number: [id number] Date of Birth/Sex: [birth year] (66 y.o. Male) Treating RN: Montey Hora Primary Care Haitham Dolinsky: Lynett Fish Other Clinician: Referring Caylen Yardley: Lynett Fish Treating Dajanique Robley/Extender: Tito Dine in Treatment: 1 Visit Information History Since Last Visit Added or deleted any medications: No Patient Arrived: Cane Any new allergies or adverse reactions: No Arrival Time: 11:09 Had a fall or experienced change in No Accompanied By: self activities of daily living that may affect Transfer Assistance: None risk of falls: Patient Identification Verified: Yes Signs or symptoms of abuse/neglect since last No Secondary Verification Process Completed: Yes visito Hospitalized since last visit: No Has Dressing in Place as Prescribed: Yes Pain Present Now: No Electronic Signature(s) Signed: 04/30/2017 5:32:14 PM By: Montey Hora Entered By: Montey Hora on 04/30/2017 11:09:53 [name] ([id number]) -------------------------------------------------------------------------------- Compression Therapy Details Patient Name: Corey Cower A. Date of Service: 04/30/2017 11:00 AM Medical Record Number: [id number] Patient Account Number: [id number] Date of Birth/Sex: 1951/06/17 (66 y.o. Male) Treating RN: Montey Hora Primary Care Lorik Guo: Lynett Fish Other Clinician: Referring Lakeysha Slutsky: Lynett Fish Treating Fausto Sampedro/Extender: Ricard Dillon Weeks in Treatment: 1 Compression Therapy Performed for Wound Wound #3 Right Lower Leg Assessment: Performed By: Clinician Montey Hora, RN Compression Type: Rolena Infante Post Procedure Diagnosis Same as Pre-procedure Electronic Signature(s) Signed: 04/30/2017 12:53:05 PM By: Montey Hora Entered By:  Montey Hora on 04/30/2017 12:53:04 [name] ([id number]) -------------------------------------------------------------------------------- Encounter Discharge Information Details Patient Name: Corey, West A. Date of Service: 04/30/2017 11:00 AM Medical Record Number: [id number] Patient Account Number: [id number] Date of Birth/Sex: [birth year] (66 y.o. Male) Treating RN: Montey Hora Primary Care Marcella Dunnaway: Lynett Fish Other Clinician: Referring Shirelle Tootle: Lynett Fish Treating Amana Bouska/Extender: Tito Dine in Treatment: 1 Encounter Discharge Information Items Discharge Pain Level: 0 Discharge Condition: Stable Ambulatory Status: Cane Discharge Destination: Home Transportation: Private Auto Accompanied By: self Schedule Follow-up Appointment: Yes Medication Reconciliation completed No and provided to Patient/Care Ailany Koren: Provided on Clinical Summary of Care: 04/30/2017 Form Type Recipient Paper Patient Corey Electronic Signature(s) Signed: 04/30/2017 11:58:23 AM By: Ruthine Dose Entered By: Ruthine Dose on 04/30/2017 11:58:22 [name] ([id number]) -------------------------------------------------------------------------------- Lower Extremity Assessment Details Patient Name: Corey Cower A. Date of Service: 04/30/2017 11:00 AM Medical Record Number: [id number] Patient Account Number: [id number] Date of Birth/Sex: 24-Mar-1951 (66 y.o. Male) Treating RN: Montey Hora Primary Care Kahlee Metivier: Lynett Fish Other Clinician: Referring Massimiliano Rohleder: Lynett Fish Treating Sundance Moise/Extender: Tito Dine in Treatment: 1 Edema Assessment Assessed: [Left: No] [Right: No] Edema: [Left: Ye] [Right: s] Calf Left: Right: Point of Measurement: 32 cm From Medial Instep cm 49.5 cm Ankle Left: Right: Point of Measurement: 13 cm From Medial Instep cm 29.2 cm Vascular Assessment Pulses: Dorsalis Pedis Palpable: [Right:Yes] Posterior  Tibial Extremity colors, hair growth, and conditions: Extremity Color: [Right:Hyperpigmented] Hair Growth on Extremity: [Right:No] Temperature of Extremity: [Right:Warm] Capillary Refill: [Right:< 3 seconds] Electronic Signature(s) Signed: 04/30/2017 5:32:14 PM By: Montey Hora Entered By: Montey Hora on 04/30/2017 11:17:17 [name] ([id number]) -------------------------------------------------------------------------------- Multi Wound Chart Details Patient Name: Corey Cower A. Date of Service: 04/30/2017 11:00 AM Medical Record Number: [id number] Patient Account Number: [id number] Date of Birth/Sex: Apr 10, 1951 (66 y.o. Male) Treating RN: Montey Hora Primary Care Migel Hannis: Lynett Fish Other Clinician: Referring Marilla Boddy: Lynett Fish Treating Sumiko Ceasar/Extender: Tito Dine  in Treatment: 1 Vital Signs Height(in): 69 Pulse(bpm): 74 Weight(lbs): 305.4 Blood Pressure 143/48 (mmHg): Body Mass Index(BMI): 45 Temperature(F): 98.1 Respiratory Rate 16 (breaths/min): Photos: [3:No Photos] [4:No Photos] [N/A:N/A] Wound Location: [3:Right Lower Leg] [4:Right Lower Leg - Lateral] [N/A:N/A] Wounding Event: [3:Gradually Appeared] [4:Gradually Appeared] [N/A:N/A] Primary Etiology: [3:Lymphedema] [4:Lymphedema] [N/A:N/A] 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] [N/A:N/A] Comorbid History: [3:Cataracts, Anemia, Sleep Apnea, Hypertension, Cirrhosis , Type II Diabetes] [4:Cataracts, Anemia, Sleep Apnea, Hypertension, Cirrhosis , Type II Diabetes] [N/A:N/A] Date Acquired: [3:01/14/2017] [4:01/07/2017] [N/A:N/A] Weeks of Treatment: [3:1] [4:1] [N/A:N/A] Wound Status: [3:Open] [4:Open] [N/A:N/A] Measurements L x W x D 5.3x6x0.1 [4:0.9x1.2x0.2] [N/A:N/A] (cm) Area (cm) : [3:24.976] [4:0.848] [N/A:N/A] Volume (cm) : [3:2.498] [4:0.17] [N/A:N/A] % Reduction in Area: [3:35.10%] [4:10.00%]  [N/A:N/A] % Reduction in Volume: 93.50% [4:9.60%] [N/A:N/A] Classification: [3:Partial Thickness] [4:Full Thickness Without Exposed Support Structures] [N/A:N/A] HBO Classification: [3:Grade 1] [4:Grade 1] [N/A:N/A] Exudate Amount: [3:Large] [4:Large] [N/A:N/A] Exudate Type: [3:Serous] [4:Serous] [N/A:N/A] Exudate Color: [3:amber] [4:amber] [N/A:N/A] Wound Margin: [3:Indistinct, nonvisible] [4:Flat and Intact] [N/A:N/A] Granulation Amount: [3:None Present (0%)] [4:Small (1-33%)] [N/A:N/A] Granulation Quality: [3:N/A] [4:Pink, Pale] [N/A:N/A] Necrotic Amount: [3:None Present (0%)] [4:Large (67-100%)] [N/A:N/A] Exposed Structures: Fascia: No Fat Layer (Subcutaneous N/A Fat Layer (Subcutaneous Tissue) Exposed: Yes Tissue) Exposed: No Fascia: No Tendon: No Tendon: No Muscle: No Muscle: No Joint: No Joint: No Bone: No Bone: No Epithelialization: None None N/A Periwound Skin Texture: Induration: Yes No Abnormalities Noted N/A Excoriation: No Callus: No Crepitus: No Rash: No Scarring: No Periwound Skin Maceration: No No Abnormalities Noted N/A Moisture: Dry/Scaly: No Periwound Skin Color: Hemosiderin Staining: Yes Erythema: Yes N/A Atrophie Blanche: No Cyanosis: No Ecchymosis: No Erythema: No Mottled: No Pallor: No Rubor: No Erythema Location: N/A Circumferential N/A Temperature: No Abnormality N/A N/A Tenderness on Yes No N/A Palpation: Wound Preparation: Ulcer Cleansing: Ulcer Cleansing: N/A Rinsed/Irrigated with Rinsed/Irrigated with Saline Saline Topical Anesthetic Topical Anesthetic Applied: None Applied: None Treatment Notes Electronic Signature(s) Signed: 04/30/2017 6:05:23 PM By: Linton Ham MD Entered By: Linton Ham on 04/30/2017 12:40:38 [name] ([id number]) -------------------------------------------------------------------------------- Multi-Disciplinary Care Plan Details Patient Name: WALKER, SITAR A. Date of Service: 04/30/2017 11:00  AM Medical Record Number: [id number] Patient Account Number: [id number] Date of Birth/Sex: 02-Apr-1951 (66 y.o. Male) Treating RN: Montey Hora Primary Care Taksh Hjort: Lynett Fish Other Clinician: Referring Lorian Yaun: Lynett Fish Treating Aubriee Szeto/Extender: Tito Dine in Treatment: 1 Active Inactive ` Orientation to the Wound Care Program Nursing Diagnoses: Knowledge deficit related to the wound healing center program Goals: Patient/caregiver will verbalize understanding of the [place] Program Date Initiated: 04/23/2017 Target Resolution Date: 05/04/2017 Goal Status: Active Interventions: Provide education on orientation to the wound center Notes: ` Venous Leg Ulcer Nursing Diagnoses: Potential for venous Insuffiency (use before diagnosis confirmed) Goals: Patient will maintain optimal edema control Date Initiated: 04/23/2017 Target Resolution Date: 05/04/2017 Goal Status: Active Interventions: Compression as ordered Treatment Activities: Therapeutic compression applied : 04/23/2017 Notes: ` Wound/Skin Impairment SHRIYAN, ARAKAWA ([id number]) Nursing Diagnoses: Impaired tissue integrity Goals: Ulcer/skin breakdown will have a volume reduction of 80% by week 12 Date Initiated: 04/23/2017 Target Resolution Date: 05/04/2017 Goal Status: Active Interventions: Assess ulceration(s) every visit Notes: Electronic Signature(s) Signed: 04/30/2017 5:32:14 PM By: Montey Hora Entered By: Montey Hora on 04/30/2017 11:24:43 [name] ([id number]) -------------------------------------------------------------------------------- Pain Assessment Details Patient Name: Corey Cower A. Date of Service: 04/30/2017 11:00 AM Medical Record Number: [id number] Patient Account Number: [id number] Date of Birth/Sex: [birth year] (  66 y.o. Male) Treating RN: Montey Hora Primary Care Kimba Lottes: Lynett Fish Other Clinician: Referring Alanmichael Barmore: Lynett Fish Treating Neko Mcgeehan/Extender: Tito Dine in Treatment: 1 Active Problems Location of Pain Severity and Description of Pain Patient Has Paino No Site Locations Pain Management and Medication Current Pain Management: Notes Topical or injectable lidocaine is offered to patient for acute pain when surgical debridement is performed. If needed, Patient is instructed to use over the counter pain medication for the following 24-48 hours after debridement. Wound care MDs do not prescribed pain medications. Patient has chronic pain or uncontrolled pain. Patient has been instructed to make an appointment with their Primary Care Physician for pain management. Electronic Signature(s) Signed: 04/30/2017 5:32:14 PM By: Montey Hora Entered By: Montey Hora on 04/30/2017 11:10:11 [name] ([id number]) -------------------------------------------------------------------------------- Patient/Caregiver Education Details Patient Name: DERRIS, MILLAN A. Date of Service: 04/30/2017 11:00 AM Medical Record Number: [id number] Patient Account Number: [id number] Date of Birth/Gender: 1951-06-24 (66 y.o. Male) Treating RN: Montey Hora Primary Care Physician: Lynett Fish Other Clinician: Referring Physician: Lynett Fish Treating Physician/Extender: Tito Dine in Treatment: 1 Education Assessment Education Provided To: Patient Education Topics Provided Venous: Handouts: Other: need for compression on left leg Methods: Explain/Verbal Responses: State content correctly Electronic Signature(s) Signed: 04/30/2017 5:32:14 PM By: Montey Hora Entered By: Montey Hora on 04/30/2017 11:30:59 [name] ([id number]) -------------------------------------------------------------------------------- Wound Assessment Details Patient Name: Corey Cower A. Date of Service: 04/30/2017 11:00 AM Medical Record Number: [id number] Patient Account Number: [id number] Date of  Birth/Sex: 1950/10/21 (66 y.o. Male) Treating RN: Montey Hora Primary Care Rogina Schiano: Lynett Fish Other Clinician: Referring Seda Kronberg: Lynett Fish Treating Promise Bushong/Extender: Tito Dine in Treatment: 1 Wound Status Wound Number: 3 Primary Lymphedema Etiology: Wound Location: Right Lower Leg Secondary Diabetic Wound/Ulcer of the Lower Wounding Event: Gradually Appeared Etiology: Extremity Date Acquired: 01/14/2017 Wound Open Weeks Of Treatment: 1 Status: Clustered Wound: No Comorbid Cataracts, Anemia, Sleep Apnea, History: Hypertension, Cirrhosis , Type II Diabetes Photos Photo Uploaded By: Montey Hora on 04/30/2017 13:25:13 Wound Measurements Length: (cm) 5.3 Width: (cm) 6 Depth: (cm) 0.1 Area: (cm) 24.976 Volume: (cm) 2.498 % Reduction in Area: 35.1% % Reduction in Volume: 93.5% Epithelialization: None Tunneling: No Undermining: No Wound Description Classification: Partial Thickness Diabetic Severity Earleen Newport): Grade 1 Wound Margin: Indistinct, nonvisibl Exudate Amount: Large Exudate Type: Serous Exudate Color: amber Foul Odor After Cleansing: No Slough/Fibrino Yes e Wound Bed Granulation Amount: None Present (0%) Exposed Structure BRISON, FIUMARA ([id number]) Necrotic Amount: None Present (0%) Fascia Exposed: No Fat Layer (Subcutaneous Tissue) Exposed: No Tendon Exposed: No Muscle Exposed: No Joint Exposed: No Bone Exposed: No Periwound Skin Texture Texture Color No Abnormalities Noted: No No Abnormalities Noted: No Callus: No Atrophie Blanche: No Crepitus: No Cyanosis: No Excoriation: No Ecchymosis: No Induration: Yes Erythema: No Rash: No Hemosiderin Staining: Yes Scarring: No Mottled: No Pallor: No Moisture Rubor: No No Abnormalities Noted: No Dry / Scaly: No Temperature / Pain Maceration: No Temperature: No Abnormality Tenderness on Palpation: Yes Wound Preparation Ulcer Cleansing: Rinsed/Irrigated with  Saline Topical Anesthetic Applied: None Treatment Notes Wound #3 (Right Lower Leg) 1. Cleansed with: Clean wound with Normal Saline Cleanse wound with antibacterial soap and water 2. Anesthetic Topical Lidocaine 4% cream to wound bed prior to debridement 3. Peri-wound Care: Barrier cream Moisturizing lotion 4. Dressing Applied: Aquacel Ag Other dressing (specify in notes) 5. Secondary Dressing Applied ABD Pad 7. Secured with [employer] to Left Lower  Extremity Notes xtrasorb OUMAR, MARCOTT ([id number]) Electronic Signature(s) Signed: 04/30/2017 5:32:14 PM By: Montey Hora Entered By: Montey Hora on 04/30/2017 11:23:06 [name] ([id number]) -------------------------------------------------------------------------------- Wound Assessment Details Patient Name: STEEN, BISIG A. Date of Service: 04/30/2017 11:00 AM Medical Record Number: [id number] Patient Account Number: [id number] Date of Birth/Sex: 08-02-1951 (66 y.o. Male) Treating RN: Montey Hora Primary Care Mayela Bullard: Lynett Fish Other Clinician: Referring Shanon Becvar: Lynett Fish Treating Eivan Gallina/Extender: Tito Dine in Treatment: 1 Wound Status Wound Number: 4 Primary Lymphedema Etiology: Wound Location: Right Lower Leg - Lateral Secondary Diabetic Wound/Ulcer of the Lower Wounding Event: Gradually Appeared Etiology: Extremity Date Acquired: 01/07/2017 Wound Open Weeks Of Treatment: 1 Status: Clustered Wound: No Comorbid Cataracts, Anemia, Sleep Apnea, History: Hypertension, Cirrhosis , Type II Diabetes Photos Photo Uploaded By: Montey Hora on 04/30/2017 13:25:13 Wound Measurements Length: (cm) 0.9 Width: (cm) 1.2 Depth: (cm) 0.2 Area: (cm) 0.848 Volume: (cm) 0.17 % Reduction in Area: 10% % Reduction in Volume: 9.6% Epithelialization: None Tunneling: No Undermining: No Wound Description Full Thickness Without Classification: Exposed Support Structures Diabetic  Severity Grade 1 (Wagner): Wound Margin: Flat and Intact Exudate Amount: Large Exudate Type: Serous Exudate Color: amber ACY, ORSAK ([id number]) Foul Odor After Cleansing: No Slough/Fibrino Yes Wound Bed Granulation Amount: Small (1-33%) Exposed Structure Granulation Quality: Pink, Pale Fascia Exposed: No Necrotic Amount: Large (67-100%) Fat Layer (Subcutaneous Tissue) Exposed: Yes Necrotic Quality: Adherent Slough Tendon Exposed: No Muscle Exposed: No Joint Exposed: No Bone Exposed: No Periwound Skin Texture Texture Color No Abnormalities Noted: No No Abnormalities Noted: No Erythema: Yes Moisture Erythema Location: Circumferential No Abnormalities Noted: No Wound Preparation Ulcer Cleansing: Rinsed/Irrigated with Saline Topical Anesthetic Applied: None Treatment Notes Wound #4 (Right, Lateral Lower Leg) 1. Cleansed with: Clean wound with Normal Saline Cleanse wound with antibacterial soap and water 2. Anesthetic Topical Lidocaine 4% cream to wound bed prior to debridement 3. Peri-wound Care: Barrier cream Moisturizing lotion 4. Dressing Applied: Aquacel Ag Other dressing (specify in notes) 5. Secondary Dressing Applied ABD Pad 7. Secured with Rolena Infante to Left Lower Extremity Notes xtrasorb Electronic Signature(s) Signed: 04/30/2017 5:32:14 PM By: Montey Hora Entered By: Montey Hora on 04/30/2017 11:23:23 [name] ([id number]) -------------------------------------------------------------------------------- [place] Details Patient Name: [name] Date of Service: 04/30/2017 11:00 AM Medical Record Number: [id number] Patient Account Number: [id number] Date of Birth/Sex: 1950/10/06 (66 y.o. Male) Treating RN: Montey Hora Primary Care Barack Nicodemus: Lynett Fish Other Clinician: Referring Wash Nienhaus: Lynett Fish Treating Chloie Loney/Extender: Tito Dine in Treatment: 1 Vital Signs Time Taken: 11:11 Temperature (F):  98.1 Height (in): 69 Pulse (bpm): 74 Weight (lbs): 305.4 Respiratory Rate (breaths/min): 16 Body Mass Index (BMI): 45.1 Blood Pressure (mmHg): 143/48 Reference Range: 80 - 120 mg / dl Electronic Signature(s) Signed: 04/30/2017 5:32:14 PM By: Montey Hora Entered By: Montey Hora on 04/30/2017 11:13:34

## 2017-05-02 ENCOUNTER — Ambulatory Visit: Payer: Medicare Other | Admitting: Occupational Therapy

## 2017-05-02 NOTE — Progress Notes (Signed)
Corey West ([id number]) Visit Report for 04/30/2017 HPI Details Patient Name: Corey West, Corey West. Date of Service: 04/30/2017 11:00 AM Medical Record Number: [id number] Patient Account Number: [id number] Date of Birth/Sex: 03/13/1951 (67 y.o. Male) Treating RN: Montey Hora Primary Care Provider: Lynett Fish Other Clinician: Referring Provider: Lynett Fish Treating Provider/Extender: Tito Dine in Treatment: 1 History of Present Illness HPI Description: Pleasant 66 year old with a past medical history significant for morbid obesity, insulin- dependent type 2 diabetes (hemoglobin A1c 7.2 in January 2015), cirrhosis, and chronic kidney disease. He reports a left anterior calf ulcer since January 2015. He has had venous stasis ulcers in the past, which have healed spontaneously. No history of DVT. Does not regularly wear compression. Ambulating normally per his baseline. Recently completed a course of antibiotics. Treated with a Profore light and Coban 2 layer compression drainage, which both fell down and he cut off. Tolerated Coban lite compression bandage. Returns to clinic for followup. No complaints today. No pain at this time. No fever or chills. No open wounds at this time. He is having difficulty finding compression garments that fit him. 04/23/17; this is now 66 year old man who returns to our clinic for nonhealing wounds on the right lateral and right posterior calf. He tells me that they have been present for 6 months. In the setting of severe right greater than left lower extremity lymphedema likely secondary to chronic venous insufficiency. He has been followed in the lymphedema clinic at least since early June with compression wraps although these have not really helped and the patient is here for our review of this situation. The patient has had problems with edema in his legs for years. He has not worn compression. He is type II diabetic that sounds  poorly controlled. He also has obstructive sleep apnea and cirrhosis. He is occasional cigar smoker 04/30/17; patient is a 66 year old man who has severe bilateral lower extremity lymphedema stage III since his early 24s he tells me. This is no doubt secondary to chronic venous insufficiency. He was going to the lymphedema clinic who forwarded him here due to skin breakdown in the right leg. He has not previously worn compression stockings. He has a large area of denuded epithelium on the posterior leg. He has a more open wound on the right lateral leg Electronic Signature(s) Signed: 04/30/2017 6:05:23 PM By: Linton Ham MD Entered By: Linton Ham on 04/30/2017 12:42:05 Corey West ([id number]) -------------------------------------------------------------------------------- Physical Exam Details Patient Name: RHYTHM, West A. Date of Service: 04/30/2017 11:00 AM Medical Record Number: [id number] Patient Account Number: [id number] Date of Birth/Sex: [birth year] (66 y.o. Male) Treating RN: Montey Hora Primary Care Provider: Lynett Fish Other Clinician: Referring Provider: Lynett Fish Treating Provider/Extender: Ricard Dillon Weeks in Treatment: 1 Constitutional Sitting or standing Blood Pressure is within target range for patient.. Pulse regular and within target range for patient.Marland Kitchen Respirations regular, non-labored and within target range.. Temperature is normal and within the target range for the patient.Marland Kitchen appears in no distress. Eyes Conjunctivae clear. No discharge. Respiratory Respiratory effort is easy and symmetric bilaterally. Rate is normal at rest and on room air.. Cardiovascular Biggest dorsalis pedis pulses are palpable. Edema present in both extremities this is severe with some pitting. Chronic skin damage related to chronic venous insufficiency with inflammation and secondary lymphedema. Lymphatic Unpalpable the popliteal or inguinal  area. Psychiatric No evidence of depression, anxiety, or agitation. Calm, cooperative, and communicative. Appropriate interactions and affect.. Notes Wound exam; the patient  has a supportive size punched out area on the right lateral calf with a healthy base. Right posterior leg large area of denuded surface epithelium. There is weeping edema fluid coming out of both of these wounds. Electronic Signature(s) Signed: 04/30/2017 6:05:23 PM By: Linton Ham MD Entered By: Linton Ham on 04/30/2017 12:44:07 Corey West ([id number]) -------------------------------------------------------------------------------- Physician Orders Details Patient Name: Corey West, Corey A. Date of Service: 04/30/2017 11:00 AM Medical Record Number: [id number] Patient Account Number: [id number] Date of Birth/Sex: [birth year] (66 y.o. Male) Treating RN: Montey Hora Primary Care Provider: Lynett Fish Other Clinician: Referring Provider: Lynett Fish Treating Provider/Extender: Tito Dine in Treatment: 1 Verbal / Phone Orders: No Diagnosis Coding Wound Cleansing Wound #3 Right Lower Leg o May Shower, gently pat wound dry prior to applying new dressing. Wound #4 Right,Lateral Lower Leg o May Shower, gently pat wound dry prior to applying new dressing. Skin Barriers/Peri-Wound Care Wound #3 Right Lower Leg o Barrier cream Wound #4 Right,Lateral Lower Leg o Barrier cream Primary Wound Dressing Wound #3 Right Lower Leg o Aquacel Ag Wound #4 Right,Lateral Lower Leg o Aquacel Ag Secondary Dressing Wound #3 Right Lower Leg o ABD pad o XtraSorb Wound #4 Right,Lateral Lower Leg o ABD pad o XtraSorb Dressing Change Frequency Wound #3 Right Lower Leg o Change dressing every week Wound #4 Right,Lateral Lower Leg o Change dressing every week Corey West, Corey West ([id number]) Follow-up Appointments Wound #3 Right Lower Leg o Return Appointment in 1 week. Wound  #4 Right,Lateral Lower Leg o Return Appointment in 1 week. Edema Control Wound #3 Right Lower Leg o Unna Boot to Right Lower Extremity o Compression Pump: Use compression pump on left lower extremity for 30 minutes, twice daily. Wound #4 Right,Lateral Lower Leg o Unna Boot to Right Lower Extremity o Compression Pump: Use compression pump on left lower extremity for 30 minutes, twice daily. Additional Orders / Instructions Wound #3 Right Lower Leg o Increase protein intake. o Other: - Get Blood Sugars below 150 Wound #4 Right,Lateral Lower Leg o Increase protein intake. o Other: - Get Blood Sugars below 150 Electronic Signature(s) Signed: 04/30/2017 5:32:14 PM By: Montey Hora Signed: 04/30/2017 6:05:23 PM By: Linton Ham MD Entered By: Montey Hora on 04/30/2017 11:33:18 Corey West ([id number]) -------------------------------------------------------------------------------- Problem List Details Patient Name: Corey West, Corey A. Date of Service: 04/30/2017 11:00 AM Medical Record Number: [id number] Patient Account Number: [id number] Date of Birth/Sex: Nov 27, 1950 (66 y.o. 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 04/23/2017 Yes breakdown of skin I89.0 Lymphedema, not elsewhere classified 04/23/2017 Yes I87.331 Chronic venous hypertension (idiopathic) with ulcer and 04/23/2017 Yes inflammation of right lower extremity E11.622 Type 2 diabetes mellitus with other skin ulcer 04/23/2017 Yes Inactive Problems Resolved Problems Electronic Signature(s) Signed: 04/30/2017 6:05:23 PM By: Linton Ham MD Entered By: Linton Ham on 04/30/2017 12:40:32 Corey West  ([id number]) -------------------------------------------------------------------------------- Progress Note Details Patient Name: Corey West Date of Service: 04/30/2017 11:00 AM Medical Record Number: [id number] Patient Account Number: [id number] Date of Birth/Sex: October 07, 1950 (66 y.o. Male) Treating RN: Montey Hora Primary Care Provider: Lynett Fish Other Clinician: Referring Provider: Lynett Fish Treating Provider/Extender: Tito Dine in Treatment: 1 Subjective History of Present Illness (HPI) Pleasant 66 year old with a past medical history significant for  morbid obesity, insulin-dependent type 2 diabetes (hemoglobin A1c 7.2 in January 2015), cirrhosis, and chronic kidney disease. He reports a left anterior calf ulcer since January 2015. He has had venous stasis ulcers in the past, which have healed spontaneously. No history of DVT. Does not regularly wear compression. Ambulating normally per his baseline. Recently completed a course of antibiotics. Treated with a Profore light and Coban 2 layer compression drainage, which both fell down and he cut off. Tolerated Coban lite compression bandage. Returns to clinic for followup. No complaints today. No pain at this time. No fever or chills. No open wounds at this time. He is having difficulty finding compression garments that fit him. 04/23/17; this is now 66 year old man who returns to our clinic for nonhealing wounds on the right lateral and right posterior calf. He tells me that they have been present for 6 months. In the setting of severe right greater than left lower extremity lymphedema likely secondary to chronic venous insufficiency. He has been followed in the lymphedema clinic at least since early June with compression wraps although these have not really helped and the patient is here for our review of this situation. The patient has had problems with edema in his legs for years. He has not worn  compression. He is type II diabetic that sounds poorly controlled. He also has obstructive sleep apnea and cirrhosis. He is occasional cigar smoker 04/30/17; patient is a 66 year old man who has severe bilateral lower extremity lymphedema stage III since his early 40s he tells me. This is no doubt secondary to chronic venous insufficiency. He was going to the lymphedema clinic who forwarded him here due to skin breakdown in the right leg. He has not previously worn compression stockings. He has a large area of denuded epithelium on the posterior leg. He has a more open wound on the right lateral leg Objective Constitutional Sitting or standing Blood Pressure is within target range for patient.. Pulse regular and within target range Corey West, Corey West ([id number]) for patient.Marland Kitchen Respirations regular, non-labored and within target range.. Temperature is normal and within the target range for the patient.Marland Kitchen appears in no distress. Vitals Time Taken: 11:11 AM, Height: 69 in, Weight: 305.4 lbs, BMI: 45.1, Temperature: 98.1 F, Pulse: 74 bpm, Respiratory Rate: 16 breaths/min, Blood Pressure: 143/48 mmHg. Eyes Conjunctivae clear. No discharge. Respiratory Respiratory effort is easy and symmetric bilaterally. Rate is normal at rest and on room air.. Cardiovascular Biggest dorsalis pedis pulses are palpable. Edema present in both extremities this is severe with some pitting. Chronic skin damage related to chronic venous insufficiency with inflammation and secondary lymphedema. Lymphatic Unpalpable the popliteal or inguinal area. Psychiatric No evidence of depression, anxiety, or agitation. Calm, cooperative, and communicative. Appropriate interactions and affect.. General Notes: Wound exam; the patient has a supportive size punched out area on the right lateral calf with a healthy base. Right posterior leg large area of denuded surface epithelium. There is weeping edema fluid coming out of both of  these wounds. Integumentary (Hair, Skin) Wound #3 status is Open. Original cause of wound was Gradually Appeared. The wound is located on the Right Lower Leg. The wound measures 5.3cm length x 6cm width x 0.1cm depth; 24.976cm^2 area and 2.498cm^3 volume. There is no tunneling or undermining noted. There is a large amount of serous drainage noted. The wound margin is indistinct and nonvisible. There is no granulation within the wound bed. There is no necrotic tissue within the wound bed. The periwound skin appearance exhibited:  Induration, Hemosiderin Staining. The periwound skin appearance did not exhibit: Callus, Crepitus, Excoriation, Rash, Scarring, Dry/Scaly, Maceration, Atrophie Blanche, Cyanosis, Ecchymosis, Mottled, Pallor, Rubor, Erythema. Periwound temperature was noted as No Abnormality. The periwound has tenderness on palpation. Wound #4 status is Open. Original cause of wound was Gradually Appeared. The wound is located on the Right,Lateral Lower Leg. The wound measures 0.9cm length x 1.2cm width x 0.2cm depth; 0.848cm^2 area and 0.17cm^3 volume. There is Fat Layer (Subcutaneous Tissue) Exposed exposed. There is no tunneling or undermining noted. There is a large amount of serous drainage noted. The wound margin is flat and intact. There is small (1-33%) pink, pale granulation within the wound bed. There is a large (67-100%) amount of necrotic tissue within the wound bed including Adherent Slough. The periwound skin appearance exhibited: Erythema. The surrounding wound skin color is noted with erythema which is circumferential. Corey West, Corey West ([id number]) Assessment Active Problems ICD-10 [phone] - Non-pressure chronic ulcer of right calf limited to breakdown of skin I89.0 - Lymphedema, not elsewhere classified I87.331 - Chronic venous hypertension (idiopathic) with ulcer and inflammation of right lower extremity E11.622 - Type 2 diabetes mellitus with other skin  ulcer Plan Wound Cleansing: Wound #3 Right Lower Leg: May Shower, gently pat wound dry prior to applying new dressing. Wound #4 Right,Lateral Lower Leg: May Shower, gently pat wound dry prior to applying new dressing. Skin Barriers/Peri-Wound Care: Wound #3 Right Lower Leg: Barrier cream Wound #4 Right,Lateral Lower Leg: Barrier cream Primary Wound Dressing: Wound #3 Right Lower Leg: Aquacel Ag Wound #4 Right,Lateral Lower Leg: Aquacel Ag Secondary Dressing: Wound #3 Right Lower Leg: ABD pad XtraSorb Wound #4 Right,Lateral Lower Leg: ABD pad XtraSorb Dressing Change Frequency: Wound #3 Right Lower Leg: Change dressing every week Wound #4 Right,Lateral Lower Leg: Change dressing every week Follow-up Appointments: Wound #3 Right Lower Leg: Return Appointment in 1 week. Wound #4 Right,Lateral Lower Leg: Corey West, Corey West ([id number]) Return Appointment in 1 week. Edema Control: Wound #3 Right Lower Leg: Unna Boot to Right Lower Extremity Compression Pump: Use compression pump on left lower extremity for 30 minutes, twice daily. Wound #4 Right,Lateral Lower Leg: Unna Boot to Right Lower Extremity Compression Pump: Use compression pump on left lower extremity for 30 minutes, twice daily. Additional Orders / Instructions: Wound #3 Right Lower Leg: Increase protein intake. Other: - Get Blood Sugars below 150 Wound #4 Right,Lateral Lower Leg: Increase protein intake. Other: - Get Blood Sugars below 150 #1 severe bilateral stage III lymphedema with wounds limited to the right leg at the moment. #2 today he has poorly controlled lymphedema in spite of attendance at the lymphedema clinic for most of June and one week of our Unna boot compression. There is still weeping edema fluid coming out of the wounds and even normal non-wounded skin. #3 we coached him last week on limb elevation when he is sitting and exercise as a mechanism of promoting mobilization of fluid. He already  occurred this in the lymphedema clinic #4 I think this patient would actually benefit from external compression pumps I think he may qualify for this on the right leg at least Electronic Signature(s) Signed: 04/30/2017 6:05:23 PM By: Linton Ham MD Entered By: Linton Ham on 04/30/2017 12:46:20 Corey West ([id number]) -------------------------------------------------------------------------------- [place] Details Patient Name: Corey Cower A. Date of Service: 04/30/2017 Medical Record Number: [id number] Patient Account Number: [id number] Date of Birth/Sex: 01-Jul-1951 (66 y.o. Male) Treating RN: Montey Hora Primary Care Provider: Lynett Fish Other Clinician: Referring  Provider: Lynett Fish Treating Provider/Extender: Tito Dine in Treatment: 1 Diagnosis Coding ICD-10 Codes Code Description [phone] Non-pressure chronic ulcer of right calf limited to breakdown of skin I89.0 Lymphedema, not elsewhere classified Chronic venous hypertension (idiopathic) with ulcer and inflammation of right lower I87.331 extremity E11.622 Type 2 diabetes mellitus with other skin ulcer Facility Procedures CPT4 Code: [id number] Description: (Facility Use Only) [phone] - APPLY Louretta Parma BOOT RT Modifier: Quantity: 1 Physician Procedures CPT4: Description Modifier Quantity Code [id number] - WC PHYS LEVEL 3 - EST PT 1 ICD-10 Description Diagnosis I89.0 Lymphedema, not elsewhere classified L97.211 Non-pressure chronic ulcer of right calf limited to breakdown of skin I87.331 Chronic  venous hypertension (idiopathic) with ulcer and inflammation of right lower extremity Electronic Signature(s) Signed: 04/30/2017 12:52:07 PM By: Montey Hora Signed: 04/30/2017 6:05:23 PM By: Linton Ham MD Entered By: Montey Hora on 04/30/2017 12:52:07

## 2017-05-07 ENCOUNTER — Encounter: Payer: Medicare Other | Attending: Physician Assistant | Admitting: Physician Assistant

## 2017-05-07 ENCOUNTER — Ambulatory Visit: Payer: Medicare Other | Admitting: Occupational Therapy

## 2017-05-07 DIAGNOSIS — I87331 Chronic venous hypertension (idiopathic) with ulcer and inflammation of right lower extremity: Secondary | ICD-10-CM | POA: Diagnosis not present

## 2017-05-07 DIAGNOSIS — G4733 Obstructive sleep apnea (adult) (pediatric): Secondary | ICD-10-CM | POA: Insufficient documentation

## 2017-05-07 DIAGNOSIS — Z794 Long term (current) use of insulin: Secondary | ICD-10-CM | POA: Diagnosis not present

## 2017-05-07 DIAGNOSIS — D649 Anemia, unspecified: Secondary | ICD-10-CM | POA: Insufficient documentation

## 2017-05-07 DIAGNOSIS — Z6841 Body Mass Index (BMI) 40.0 and over, adult: Secondary | ICD-10-CM | POA: Insufficient documentation

## 2017-05-07 DIAGNOSIS — E11622 Type 2 diabetes mellitus with other skin ulcer: Secondary | ICD-10-CM | POA: Diagnosis not present

## 2017-05-07 DIAGNOSIS — I10 Essential (primary) hypertension: Secondary | ICD-10-CM | POA: Insufficient documentation

## 2017-05-07 DIAGNOSIS — F17290 Nicotine dependence, other tobacco product, uncomplicated: Secondary | ICD-10-CM | POA: Diagnosis not present

## 2017-05-07 DIAGNOSIS — K7460 Unspecified cirrhosis of liver: Secondary | ICD-10-CM | POA: Insufficient documentation

## 2017-05-07 DIAGNOSIS — L97211 Non-pressure chronic ulcer of right calf limited to breakdown of skin: Secondary | ICD-10-CM | POA: Diagnosis not present

## 2017-05-07 DIAGNOSIS — I890 Lymphedema, not elsewhere classified: Secondary | ICD-10-CM | POA: Diagnosis not present

## 2017-05-08 NOTE — Progress Notes (Signed)
BRENDEN, RUDMAN ([id number]) Visit Report for 05/07/2017 Chief Complaint Document Details Patient Name: Corey West, Corey West. Date of Service: 05/07/2017 12:30 PM Medical Record Number: [id number] Patient Account Number: [id number] Date of Birth/Sex: 1951/08/30 (66 y.o. Male) Treating RN: Montey Hora Primary Care Provider: Lynett Fish Other Clinician: Referring Provider: Lynett Fish Treating Provider/Extender: Worthy Keeler Weeks in Treatment: 2 Information Obtained from: Patient Chief Complaint LLE VSU since Jan 2015 04/23/17; patient is here for review of wounds on his right lateral and right posterior calf Electronic Signature(s) Signed: 05/07/2017 5:26:05 PM By: Worthy Keeler PA-C Entered By: Worthy Keeler on 05/07/2017 13:17:15 Corey West ([id number]) -------------------------------------------------------------------------------- HPI Details Patient Name: Corey West Date of Service: 05/07/2017 12:30 PM Medical Record Number: [id number] Patient Account Number: [id number] Date of Birth/Sex: Mar 30, 1951 (66 y.o. Male) Treating RN: Montey Hora Primary Care Provider: Lynett Fish Other Clinician: Referring Provider: Lynett Fish Treating Provider/Extender: Melburn Hake, HOYT Weeks in Treatment: 2 History of Present Illness HPI Description: Pleasant 66 year old with a past medical history significant for morbid obesity, insulin- dependent type 2 diabetes (hemoglobin A1c 7.2 in January 2015), cirrhosis, and chronic kidney disease. He reports a left anterior calf ulcer since January 2015. He has had venous stasis ulcers in the past, which have healed spontaneously. No history of DVT. Does not regularly wear compression. Ambulating normally per his baseline. Recently completed a course of antibiotics. Treated with a Profore light and Coban 2 layer compression drainage, which both fell down and he cut off. Tolerated Coban lite compression bandage. Returns to clinic for  followup. No complaints today. No pain at this time. No fever or chills. No open wounds at this time. He is having difficulty finding compression garments that fit him. 04/23/17; this is now 67 year old man who returns to our clinic for nonhealing wounds on the right lateral and right posterior calf. He tells me that they have been present for 6 months. In the setting of severe right greater than left lower extremity lymphedema likely secondary to chronic venous insufficiency. He has been followed in the lymphedema clinic at least since early June with compression wraps although these have not really helped and the patient is here for our review of this situation. The patient has had problems with edema in his legs for years. He has not worn compression. He is type II diabetic that sounds poorly controlled. He also has obstructive sleep apnea and cirrhosis. He is occasional cigar smoker 04/30/17; patient is a 66 year old man who has severe bilateral lower extremity lymphedema stage III since his early 60s he tells me. This is no doubt secondary to chronic venous insufficiency. He was going to the lymphedema clinic who forwarded him here due to skin breakdown in the right leg. He has not previously worn compression stockings. He has a large area of denuded epithelium on the posterior leg. He has a more open wound on the right lateral leg 05/07/17 on evaluation today patient appears to be doing about the same his wounds are showing signs of improvement and looking fairly well. With that being said he is not having any severe discomfort just some mild aching due to the swelling and really this is worse on the left than the right and the right side is where he actually has his wounds. He has been tolerating the [employer] wraps without complication and states this is actually much better than the wraps that he was getting at the lymphedema clinic most recently.  He is happy with these wraps. He has no  nausea, vomiting, diarrhea and no fevers or chills. We are still waiting to hear back in regard to the lymphedema pumps which I do think would be beneficial for him even with the compression wraps he still has a significant amount of edema noted of the bilateral lower extremities. Electronic Signature(s) Signed: 05/07/2017 5:26:05 PM By: Worthy Keeler PA-C Entered By: Worthy Keeler on 05/07/2017 13:31:17 Corey West, Corey West ([id number]) Corey West, Corey West ([id number]) -------------------------------------------------------------------------------- Physical Exam Details Patient Name: Corey West, Corey West. Date of Service: 05/07/2017 12:30 PM Medical Record Number: [id number] Patient Account Number: [id number] Date of Birth/Sex: [birth year] (66 y.o. Male) Treating RN: Montey Hora Primary Care Provider: Lynett Fish Other Clinician: Referring Provider: Lynett Fish Treating Provider/Extender: Melburn Hake, HOYT Weeks in Treatment: 2 Constitutional Obese and well-hydrated in no acute distress. Respiratory normal breathing without difficulty. clear to auscultation bilaterally. Cardiovascular regular rate and rhythm with normal S1, S2. 3+ pitting edema of the bilateral lower extremities. Psychiatric this patient is able to make decisions and demonstrates good insight into disease process. Alert and Oriented x 3. pleasant and cooperative. Notes Patient's wounds all appear to have an excellent granular bed in regard to the right lower extremity. He has no real discomfort with cleansing of the wounds which is good news although he does have significant edema of the bilateral lower extremities. No erythema surrounding the wound. Electronic Signature(s) Signed: 05/07/2017 5:26:05 PM By: Worthy Keeler PA-C Entered By: Worthy Keeler on 05/07/2017 13:31:53 Corey West ([id number]) -------------------------------------------------------------------------------- Physician Orders Details Patient Name:  Corey West, Corey A. Date of Service: 05/07/2017 12:30 PM Medical Record Number: [id number] Patient Account Number: [id number] Date of Birth/Sex: 05-01-1951 (66 y.o. Male) Treating RN: Montey Hora Primary Care Provider: Lynett Fish Other Clinician: Referring Provider: Lynett Fish Treating Provider/Extender: Melburn Hake, HOYT Weeks in Treatment: 2 Verbal / Phone Orders: No Diagnosis Coding ICD-10 Coding Code Description I89.0 Lymphedema, not elsewhere classified L97.211 Non-pressure chronic ulcer of right calf limited to breakdown of skin Chronic venous hypertension (idiopathic) with ulcer and inflammation of right lower I87.331 extremity E11.622 Type 2 diabetes mellitus with other skin ulcer Wound Cleansing Wound #3 Right Lower Leg o May Shower, gently pat wound dry prior to applying new dressing. Wound #4 Right,Lateral Lower Leg o May Shower, gently pat wound dry prior to applying new dressing. Skin Barriers/Peri-Wound Care Wound #3 Right Lower Leg o Barrier cream Wound #4 Right,Lateral Lower Leg o Barrier cream Primary Wound Dressing Wound #3 Right Lower Leg o Aquacel Ag Wound #4 Right,Lateral Lower Leg o Aquacel Ag Secondary Dressing Wound #3 Right Lower Leg o ABD pad o XtraSorb Wound #4 Right,Lateral Lower Leg o ABD pad Corey West, Corey West ([id number]) o XtraSorb Dressing Change Frequency Wound #3 Right Lower Leg o Change dressing every week Wound #4 Right,Lateral Lower Leg o Change dressing every week Follow-up Appointments Wound #3 Right Lower Leg o Return Appointment in 1 week. Wound #4 Right,Lateral Lower Leg o Return Appointment in 1 week. Edema Control Wound #3 Right Lower Leg o Unna Boot to Right Lower Extremity o Compression Pump: Use compression pump on left lower extremity for 30 minutes, twice daily. Wound #4 Right,Lateral Lower Leg o Unna Boot to Right Lower Extremity o Compression Pump: Use compression pump on  left lower extremity for 30 minutes, twice daily. Additional Orders / Instructions Wound #3 Right Lower Leg o Increase protein intake. o Other: - Get Blood Sugars  below 150 Wound #4 Right,Lateral Lower Leg o Increase protein intake. o Other: - Get Blood Sugars below 150 Notes I'm going to recommend that we continue with the Unna Boot at this point in time is that seems to be beneficial for him to a degree. Nonetheless I think that compression pumps for lymphedema specifically would be very beneficial. We have Artie sent some notes and o'clock to this to see about insurance approval and consent additional if necessary. Patient is in agreement with this plan and thinks the pumps would be helpful for him. We will see him for reevaluation in one week to see were things stand at that point. The [employer] was reapply today. Electronic Signature(s) Signed: 05/07/2017 5:26:05 PM By: Caffie Damme, Linus Mako ([id number]) Entered By: Worthy Keeler on 05/07/2017 13:33:19 Corey West ([id number]) -------------------------------------------------------------------------------- Problem List Details Patient Name: DORSEL, FLINN A. Date of Service: 05/07/2017 12:30 PM Medical Record Number: [id number] Patient Account Number: [id number] Date of Birth/Sex: 03-Mar-1951 (66 y.o. Male) Treating RN: Montey Hora Primary Care Provider: Lynett Fish Other Clinician: Referring Provider: Lynett Fish Treating Provider/Extender: Melburn Hake, HOYT Weeks in Treatment: 2 Active Problems ICD-10 Encounter Code Description Active Date Diagnosis I89.0 Lymphedema, not elsewhere classified 04/23/2017 Yes L97.211 Non-pressure chronic ulcer of right calf limited to 04/23/2017 Yes breakdown of skin I87.331 Chronic venous hypertension (idiopathic) with ulcer and 04/23/2017 Yes inflammation of right lower extremity E11.622 Type 2 diabetes mellitus with other skin ulcer 04/23/2017 Yes Inactive  Problems Resolved Problems Electronic Signature(s) Signed: 05/07/2017 5:26:05 PM By: Worthy Keeler PA-C Entered By: Worthy Keeler on 05/07/2017 13:16:50 Corey West ([id number]) -------------------------------------------------------------------------------- Progress Note Details Patient Name: Corey West Date of Service: 05/07/2017 12:30 PM Medical Record Number: [id number] Patient Account Number: [id number] Date of Birth/Sex: October 08, 1950 (66 y.o. Male) Treating RN: Montey Hora Primary Care Provider: Lynett Fish Other Clinician: Referring Provider: Lynett Fish Treating Provider/Extender: Worthy Keeler Weeks in Treatment: 2 Subjective Chief Complaint Information obtained from Patient LLE VSU since Jan 2015 04/23/17; patient is here for review of wounds on his right lateral and right posterior calf History of Present Illness (HPI) Pleasant 66 year old with a past medical history significant for morbid obesity, insulin-dependent type 2 diabetes (hemoglobin A1c 7.2 in January 2015), cirrhosis, and chronic kidney disease. He reports a left anterior calf ulcer since January 2015. He has had venous stasis ulcers in the past, which have healed spontaneously. No history of DVT. Does not regularly wear compression. Ambulating normally per his baseline. Recently completed a course of antibiotics. Treated with a Profore light and Coban 2 layer compression drainage, which both fell down and he cut off. Tolerated Coban lite compression bandage. Returns to clinic for followup. No complaints today. No pain at this time. No fever or chills. No open wounds at this time. He is having difficulty finding compression garments that fit him. 04/23/17; this is now 66 year old man who returns to our clinic for nonhealing wounds on the right lateral and right posterior calf. He tells me that they have been present for 6 months. In the setting of severe right greater than left lower extremity  lymphedema likely secondary to chronic venous insufficiency. He has been followed in the lymphedema clinic at least since early June with compression wraps although these have not really helped and the patient is here for our review of this situation. The patient has had problems with edema in his legs for years. He has  not worn compression. He is type II diabetic that sounds poorly controlled. He also has obstructive sleep apnea and cirrhosis. He is occasional cigar smoker 04/30/17; patient is a 66 year old man who has severe bilateral lower extremity lymphedema stage III since his early 79s he tells me. This is no doubt secondary to chronic venous insufficiency. He was going to the lymphedema clinic who forwarded him here due to skin breakdown in the right leg. He has not previously worn compression stockings. He has a large area of denuded epithelium on the posterior leg. He has a more open wound on the right lateral leg 05/07/17 on evaluation today patient appears to be doing about the same his wounds are showing signs of improvement and looking fairly well. With that being said he is not having any severe discomfort just some mild aching due to the swelling and really this is worse on the left than the right and the right side is where he actually has his wounds. He has been tolerating the [employer] wraps without complication and states this is actually much better than the wraps that he was getting at the lymphedema clinic most recently. He is happy with these wraps. He has no nausea, vomiting, diarrhea and no fevers or chills. We are still waiting to hear back in regard to the lymphedema pumps which I do think would be beneficial for him even with the Corey West, Corey West. ([id number]) compression wraps he still has a significant amount of edema noted of the bilateral lower extremities. Objective Constitutional Obese and well-hydrated in no acute distress. Vitals Time Taken: 1:02 PM, Height: 69 in,  Weight: 305.4 lbs, BMI: 45.1, Temperature: 98.4 F, Pulse: 79 bpm, Respiratory Rate: 20 breaths/min, Blood Pressure: 154/49 mmHg. Respiratory normal breathing without difficulty. clear to auscultation bilaterally. Cardiovascular regular rate and rhythm with normal S1, S2. 3+ pitting edema of the bilateral lower extremities. Psychiatric this patient is able to make decisions and demonstrates good insight into disease process. Alert and Oriented x 3. pleasant and cooperative. General Notes: Patient's wounds all appear to have an excellent granular bed in regard to the right lower extremity. He has no real discomfort with cleansing of the wounds which is good news although he does have significant edema of the bilateral lower extremities. No erythema surrounding the wound. Integumentary (Hair, Skin) Wound #3 status is Open. Original cause of wound was Gradually Appeared. The wound is located on the Right Lower Leg. The wound measures 5.5cm length x 4cm width x 0.1cm depth; 17.279cm^2 area and 1.728cm^3 volume. There is no tunneling or undermining noted. There is a large amount of serous drainage noted. The wound margin is indistinct and nonvisible. There is no granulation within the wound bed. There is no necrotic tissue within the wound bed. The periwound skin appearance exhibited: Induration, Hemosiderin Staining. The periwound skin appearance did not exhibit: Callus, Crepitus, Excoriation, Rash, Scarring, Dry/Scaly, Maceration, Atrophie Blanche, Cyanosis, Ecchymosis, Mottled, Pallor, Rubor, Erythema. Periwound temperature was noted as No Abnormality. The periwound has tenderness on palpation. Wound #4 status is Open. Original cause of wound was Gradually Appeared. The wound is located on the Right,Lateral Lower Leg. The wound measures 0.8cm length x 1cm width x 0.2cm depth; 0.628cm^2 area and 0.126cm^3 volume. There is Fat Layer (Subcutaneous Tissue) Exposed exposed. There is no  tunneling or undermining noted. There is a large amount of serous drainage noted. The wound margin is flat and intact. There is small (1-33%) pink, pale granulation within the wound bed. There  is a large (67-100%) Corey West, Corey West. ([id number]) amount of necrotic tissue within the wound bed including Adherent Slough. The periwound skin appearance exhibited: Erythema. The surrounding wound skin color is noted with erythema which is circumferential. Assessment Active Problems ICD-10 I89.0 - Lymphedema, not elsewhere classified L97.211 - Non-pressure chronic ulcer of right calf limited to breakdown of skin I87.331 - Chronic venous hypertension (idiopathic) with ulcer and inflammation of right lower extremity E11.622 - Type 2 diabetes mellitus with other skin ulcer Plan Wound Cleansing: Wound #3 Right Lower Leg: May Shower, gently pat wound dry prior to applying new dressing. Wound #4 Right,Lateral Lower Leg: May Shower, gently pat wound dry prior to applying new dressing. Skin Barriers/Peri-Wound Care: Wound #3 Right Lower Leg: Barrier cream Wound #4 Right,Lateral Lower Leg: Barrier cream Primary Wound Dressing: Wound #3 Right Lower Leg: Aquacel Ag Wound #4 Right,Lateral Lower Leg: Aquacel Ag Secondary Dressing: Wound #3 Right Lower Leg: ABD pad XtraSorb Wound #4 Right,Lateral Lower Leg: ABD pad XtraSorb Dressing Change Frequency: Wound #3 Right Lower Leg: Change dressing every week Wound #4 Right,Lateral Lower Leg: Change dressing every week Corey West, Corey West ([id number]) Follow-up Appointments: Wound #3 Right Lower Leg: Return Appointment in 1 week. Wound #4 Right,Lateral Lower Leg: Return Appointment in 1 week. Edema Control: Wound #3 Right Lower Leg: Unna Boot to Right Lower Extremity Compression Pump: Use compression pump on left lower extremity for 30 minutes, twice daily. Wound #4 Right,Lateral Lower Leg: Unna Boot to Right Lower Extremity Compression Pump: Use  compression pump on left lower extremity for 30 minutes, twice daily. Additional Orders / Instructions: Wound #3 Right Lower Leg: Increase protein intake. Other: - Get Blood Sugars below 150 Wound #4 Right,Lateral Lower Leg: Increase protein intake. Other: - Get Blood Sugars below 150 General Notes: I'm going to recommend that we continue with the Unna Boot at this point in time is that seems to be beneficial for him to a degree. Nonetheless I think that compression pumps for lymphedema specifically would be very beneficial. We have Artie sent some notes and o'clock to this to see about insurance approval and consent additional if necessary. Patient is in agreement with this plan and thinks the pumps would be helpful for him. We will see him for reevaluation in one week to see were things stand at that point. The [employer] was reapply today. Electronic Signature(s) Signed: 05/07/2017 5:26:05 PM By: Worthy Keeler PA-C Entered By: Worthy Keeler on 05/07/2017 13:33:27 Corey West ([id number]) -------------------------------------------------------------------------------- SuperBill Details Patient Name: Corey West, Corey A. Date of Service: 05/07/2017 Medical Record Number: [id number] Patient Account Number: [id number] Date of Birth/Sex: 1950/11/07 (66 y.o. Male) Treating RN: Montey Hora Primary Care Provider: Lynett Fish Other Clinician: Referring Provider: Lynett Fish Treating Provider/Extender: Melburn Hake, HOYT Weeks in Treatment: 2 Diagnosis Coding ICD-10 Codes Code Description I89.0 Lymphedema, not elsewhere classified L97.211 Non-pressure chronic ulcer of right calf limited to breakdown of skin Chronic venous hypertension (idiopathic) with ulcer and inflammation of right lower I87.331 extremity E11.622 Type 2 diabetes mellitus with other skin ulcer Facility Procedures CPT4 Code: [id number] Description: (Facility Use Only) [phone] - APPLY Louretta Parma BOOT  RT Modifier: Quantity: 1 Physician Procedures CPT4: Description Modifier Quantity Code [id number] - WC PHYS LEVEL 3 - EST PT 1 ICD-10 Description Diagnosis I89.0 Lymphedema, not elsewhere classified L97.211 Non-pressure chronic ulcer of right calf limited to breakdown of skin I87.331 Chronic  venous hypertension (idiopathic) with ulcer and inflammation of right lower extremity E11.622 Type  2 diabetes mellitus with other skin ulcer Electronic Signature(s) Signed: 05/07/2017 5:26:05 PM By: Worthy Keeler PA-C Signed: 05/07/2017 5:39:54 PM By: Alric Quan Entered By: Alric Quan on 05/07/2017 17:12:56

## 2017-05-08 NOTE — Progress Notes (Signed)
Corey West, Corey West ([id number]) Visit Report for 05/07/2017 Arrival Information Details Patient Name: Corey West, Corey West. Date of Service: 05/07/2017 12:30 PM Medical Record Number: [id number] Patient Account Number: [id number] Date of Birth/Sex: 1951/06/10 (66 y.o. Male) Treating RN: Montey Hora Primary Care Kameka Whan: Lynett Fish Other Clinician: Referring Sheralee Qazi: Lynett Fish Treating Schneider Warchol/Extender: Melburn Hake, HOYT Weeks in Treatment: 2 Visit Information History Since Last Visit Added or deleted any medications: No Patient Arrived: Cane Any new allergies or adverse reactions: No Arrival Time: 13:01 Had a fall or experienced change in No Accompanied By: self activities of daily living that may affect Transfer Assistance: None risk of falls: Patient Identification Verified: Yes Signs or symptoms of abuse/neglect since last No Secondary Verification Process Completed: Yes visito Hospitalized since last visit: No Has Dressing in Place as Prescribed: Yes Has Compression in Place as Prescribed: Yes Pain Present Now: No Electronic Signature(s) Signed: 05/07/2017 5:55:03 PM By: Montey Hora Entered By: Montey Hora on 05/07/2017 13:02:17 Corey West ([id number]) -------------------------------------------------------------------------------- Encounter Discharge Information Details Patient Name: Corey Cower A. Date of Service: 05/07/2017 12:30 PM Medical Record Number: [id number] Patient Account Number: [id number] Date of Birth/Sex: 1950-12-26 (66 y.o. Male) Treating RN: Montey Hora Primary Care Jaziah Kwasnik: Lynett Fish Other Clinician: Referring Armandina Iman: Lynett Fish Treating Maynor Mwangi/Extender: Melburn Hake, HOYT Weeks in Treatment: 2 Encounter Discharge Information Items Discharge Pain Level: 0 Discharge Condition: Stable Ambulatory Status: Cane Discharge Destination: Home Transportation: Private Auto Accompanied By: self Schedule Follow-up Appointment:  Yes Medication Reconciliation completed No and provided to Patient/Care Shandelle Borrelli: Provided on Clinical Summary of Care: 05/07/2017 Form Type Recipient Paper Patient [name] Signature(s) Signed: 05/07/2017 5:39:54 PM By: Alric Quan Previous Signature: 05/07/2017 1:34:25 PM Version By: Ruthine Dose Entered By: Alric Quan on 05/07/2017 17:13:47 Corey West ([id number]) -------------------------------------------------------------------------------- Lower Extremity Assessment Details Patient Name: Corey Cower A. Date of Service: 05/07/2017 12:30 PM Medical Record Number: [id number] Patient Account Number: [id number] Date of Birth/Sex: 23-Nov-1950 (66 y.o. Male) Treating RN: Montey Hora Primary Care Odalis Jordan: Lynett Fish Other Clinician: Referring Janylah Belgrave: Lynett Fish Treating Caroljean Monsivais/Extender: Melburn Hake, HOYT Weeks in Treatment: 2 Edema Assessment Assessed: [Left: No] [Right: No] Edema: [Left: Ye] [Right: s] Calf Left: Right: Point of Measurement: 32 cm From Medial Instep cm 49.5 cm Ankle Left: Right: Point of Measurement: 13 cm From Medial Instep cm 29.5 cm Vascular Assessment Pulses: Dorsalis Pedis Palpable: [Right:Yes] Posterior Tibial Extremity colors, hair growth, and conditions: Extremity Color: [Right:Hyperpigmented] Hair Growth on Extremity: [Right:No] Temperature of Extremity: [Right:Warm] Capillary Refill: [Right:< 3 seconds] Electronic Signature(s) Signed: 05/07/2017 5:55:03 PM By: Montey Hora Entered By: Montey Hora on 05/07/2017 13:09:21 Corey West ([id number]) -------------------------------------------------------------------------------- Multi Wound Chart Details Patient Name: Corey Cower A. Date of Service: 05/07/2017 12:30 PM Medical Record Number: [id number] Patient Account Number: [id number] Date of Birth/Sex: 1950-11-20 (66 y.o. Male) Treating RN: Montey Hora Primary Care Norma Ignasiak: Lynett Fish Other  Clinician: Referring Arjuna Doeden: Lynett Fish Treating Aggie Douse/Extender: Melburn Hake, HOYT Weeks in Treatment: 2 Vital Signs Height(in): 69 Pulse(bpm): 79 Weight(lbs): 305.4 Blood Pressure 154/49 (mmHg): Body Mass Index(BMI): 45 Temperature(F): 98.4 Respiratory Rate 20 (breaths/min): Photos: [3:No Photos] [4:No Photos] [N/A:N/A] Wound Location: [3:Right Lower Leg] [4:Right Lower Leg - Lateral] [N/A:N/A] Wounding Event: [3:Gradually Appeared] [4:Gradually Appeared] [N/A:N/A] Primary Etiology: [3:Lymphedema] [4:Lymphedema] [N/A:N/A] 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] [N/A:N/A] Comorbid History: [3:Cataracts, Anemia, Sleep Apnea, Hypertension, Cirrhosis , Type II Diabetes] [4:Cataracts, Anemia, Sleep Apnea, Hypertension, Cirrhosis ,  Type II Diabetes] [N/A:N/A] Date Acquired: [3:01/14/2017] [4:01/07/2017] [N/A:N/A] Weeks of Treatment: [3:2] [4:2] [N/A:N/A] Wound Status: [3:Open] [4:Open] [N/A:N/A] Measurements L x W x D 5.5x4x0.1 [4:0.8x1x0.2] [N/A:N/A] (cm) Area (cm) : [3:17.279] [4:0.628] [N/A:N/A] Volume (cm) : [3:1.728] [4:0.126] [N/A:N/A] % Reduction in Area: [3:55.10%] [4:33.30%] [N/A:N/A] % Reduction in Volume: 95.50% [4:33.00%] [N/A:N/A] Classification: [3:Partial Thickness] [4:Full Thickness Without Exposed Support Structures] [N/A:N/A] HBO Classification: [3:Grade 1] [4:Grade 1] [N/A:N/A] Exudate Amount: [3:Large] [4:Large] [N/A:N/A] Exudate Type: [3:Serous] [4:Serous] [N/A:N/A] Exudate Color: [3:amber] [4:amber] [N/A:N/A] Wound Margin: [3:Indistinct, nonvisible] [4:Flat and Intact] [N/A:N/A] Granulation Amount: [3:None Present (0%)] [4:Small (1-33%)] [N/A:N/A] Granulation Quality: [3:N/A] [4:Pink, Pale] [N/A:N/A] Necrotic Amount: [3:None Present (0%)] [4:Large (67-100%)] [N/A:N/A] Exposed Structures: Fascia: No Fat Layer (Subcutaneous N/A Fat Layer (Subcutaneous Tissue) Exposed:  Yes Tissue) Exposed: No Fascia: No Tendon: No Tendon: No Muscle: No Muscle: No Joint: No Joint: No Bone: No Bone: No Epithelialization: Large (67-100%) None N/A Periwound Skin Texture: Induration: Yes No Abnormalities Noted N/A Excoriation: No Callus: No Crepitus: No Rash: No Scarring: No Periwound Skin Maceration: No No Abnormalities Noted N/A Moisture: Dry/Scaly: No Periwound Skin Color: Hemosiderin Staining: Yes Erythema: Yes N/A Atrophie Blanche: No Cyanosis: No Ecchymosis: No Erythema: No Mottled: No Pallor: No Rubor: No Erythema Location: N/A Circumferential N/A Temperature: No Abnormality N/A N/A Tenderness on Yes No N/A Palpation: Wound Preparation: Ulcer Cleansing: Ulcer Cleansing: N/A Rinsed/Irrigated with Rinsed/Irrigated with Saline Saline Topical Anesthetic Topical Anesthetic Applied: None Applied: None Treatment Notes Electronic Signature(s) Signed: 05/07/2017 5:55:03 PM By: Montey Hora Entered By: Montey Hora on 05/07/2017 13:21:24 Corey West ([id number]) -------------------------------------------------------------------------------- [place] Details Patient Name: Corey West, Corey A. Date of Service: 05/07/2017 12:30 PM Medical Record Number: [id number] Patient Account Number: [id number] Date of Birth/Sex: 29-Sep-1951 (66 y.o. Male) Treating RN: Montey Hora Primary Care Calyb Mcquarrie: Lynett Fish Other Clinician: Referring Shemuel Harkleroad: Lynett Fish Treating Drexel Ivey/Extender: Melburn Hake, HOYT Weeks in Treatment: 2 Active Inactive ` Orientation to the Wound Care Program Nursing Diagnoses: Knowledge deficit related to the wound healing center program Goals: Patient/caregiver will verbalize understanding of the [place] Program Date Initiated: 04/23/2017 Target Resolution Date: 05/04/2017 Goal Status: Active Interventions: Provide education on orientation to the wound center Notes: ` Venous Leg  Ulcer Nursing Diagnoses: Potential for venous Insuffiency (use before diagnosis confirmed) Goals: Patient will maintain optimal edema control Date Initiated: 04/23/2017 Target Resolution Date: 05/04/2017 Goal Status: Active Interventions: Compression as ordered Treatment Activities: Therapeutic compression applied : 04/23/2017 Notes: ` Wound/Skin Impairment Corey West, Corey West ([id number]) Nursing Diagnoses: Impaired tissue integrity Goals: Ulcer/skin breakdown will have a volume reduction of 80% by week 12 Date Initiated: 04/23/2017 Target Resolution Date: 05/04/2017 Goal Status: Active Interventions: Assess ulceration(s) every visit Notes: Electronic Signature(s) Signed: 05/07/2017 5:55:03 PM By: Montey Hora Entered By: Montey Hora on 05/07/2017 13:21:14 Corey West ([id number]) -------------------------------------------------------------------------------- Pain Assessment Details Patient Name: Corey Cower A. Date of Service: 05/07/2017 12:30 PM Medical Record Number: [id number] Patient Account Number: [id number] Date of Birth/Sex: [birth year] (66 y.o. Male) Treating RN: Montey Hora Primary Care Jerremy Maione: Lynett Fish Other Clinician: Referring Ronnie Doo: Lynett Fish Treating Jr Milliron/Extender: Melburn Hake, HOYT Weeks in Treatment: 2 Active Problems Location of Pain Severity and Description of Pain Patient Has Paino No Site Locations Pain Management and Medication Current Pain Management: Electronic Signature(s) Signed: 05/07/2017 5:55:03 PM By: Montey Hora Entered By: Montey Hora on 05/07/2017 13:02:25 Corey West ([id number]) -------------------------------------------------------------------------------- Patient/Caregiver Education Details Patient Name: Corey Cower A. Date of Service: 05/07/2017 12:30 PM Medical  Record Number: [id number] Patient Account Number: [id number] Date of Birth/Gender: March 01, 1951 (66 y.o. Male) Treating RN: Montey Hora Primary  Care Physician: Lynett Fish Other Clinician: Referring Physician: Lynett Fish Treating Physician/Extender: Sharalyn Ink in Treatment: 2 Education Assessment Education Provided To: Patient Education Topics Provided Venous: Handouts: Other: need for ongoing lymphedema management Methods: Explain/Verbal Responses: State content correctly Electronic Signature(s) Signed: 05/07/2017 5:55:03 PM By: Montey Hora Entered By: Montey Hora on 05/07/2017 13:22:14 Corey West ([id number]) -------------------------------------------------------------------------------- Wound Assessment Details Patient Name: Corey Cower A. Date of Service: 05/07/2017 12:30 PM Medical Record Number: [id number] Patient Account Number: [id number] Date of Birth/Sex: [birth year] (66 y.o. Male) Treating RN: Montey Hora Primary Care Emagene Merfeld: Lynett Fish Other Clinician: Referring Jamoni Hewes: Lynett Fish Treating Leronda Lewers/Extender: Melburn Hake, HOYT Weeks in Treatment: 2 Wound Status Wound Number: 3 Primary Lymphedema Etiology: Wound Location: Right Lower Leg Secondary Diabetic Wound/Ulcer of the Lower Wounding Event: Gradually Appeared Etiology: Extremity Date Acquired: 01/14/2017 Wound Open Weeks Of Treatment: 2 Status: Clustered Wound: No Comorbid Cataracts, Anemia, Sleep Apnea, History: Hypertension, Cirrhosis , Type II Diabetes Photos Photo Uploaded By: Montey Hora on 05/07/2017 17:32:26 Wound Measurements Length: (cm) 5.5 Width: (cm) 4 Depth: (cm) 0.1 Area: (cm) 17.279 Volume: (cm) 1.728 % Reduction in Area: 55.1% % Reduction in Volume: 95.5% Epithelialization: Large (67-100%) Tunneling: No Undermining: No Wound Description Classification: Partial Thickness Diabetic Severity (Wagner): Grade 1 Wound Margin: Indistinct, nonvisib Exudate Amount: Large Exudate Type: Serous Exudate Color: amber Foul Odor After Cleansing: No Slough/Fibrino Yes le Wound  Bed Granulation Amount: None Present (0%) Exposed Structure Corey West, Corey West ([id number]) Necrotic Amount: None Present (0%) Fascia Exposed: No Fat Layer (Subcutaneous Tissue) Exposed: No Tendon Exposed: No Muscle Exposed: No Joint Exposed: No Bone Exposed: No Periwound Skin Texture Texture Color No Abnormalities Noted: No No Abnormalities Noted: No Callus: No Atrophie Blanche: No Crepitus: No Cyanosis: No Excoriation: No Ecchymosis: No Induration: Yes Erythema: No Rash: No Hemosiderin Staining: Yes Scarring: No Mottled: No Pallor: No Moisture Rubor: No No Abnormalities Noted: No Dry / Scaly: No Temperature / Pain Maceration: No Temperature: No Abnormality Tenderness on Palpation: Yes Wound Preparation Ulcer Cleansing: Rinsed/Irrigated with Saline Topical Anesthetic Applied: None Treatment Notes Wound #3 (Right Lower Leg) 1. Cleansed with: Clean wound with Normal Saline Cleanse wound with antibacterial soap and water 2. Anesthetic Topical Lidocaine 4% cream to wound bed prior to debridement 4. Dressing Applied: Aquacel Ag 5. Secondary Dressing Applied ABD Pad 7. Secured with Engineer, drilling to Right Lower Extremity Notes xtrasorb Electronic Signature(s) Signed: 05/07/2017 5:55:03 PM By: Montey Hora Entered By: Montey Hora on 05/07/2017 13:14:57 Corey West ([id number]) Corey West, Corey West ([id number]) -------------------------------------------------------------------------------- Wound Assessment Details Patient Name: Corey West, Corey A. Date of Service: 05/07/2017 12:30 PM Medical Record Number: [id number] Patient Account Number: [id number] Date of Birth/Sex: 1951-03-01 (66 y.o. Male) Treating RN: Montey Hora Primary Care Danen Lapaglia: Lynett Fish Other Clinician: Referring Arias Weinert: Lynett Fish Treating Sherry Blackard/Extender: Melburn Hake, HOYT Weeks in Treatment: 2 Wound Status Wound Number: 4 Primary Lymphedema Etiology: Wound Location: Right Lower  Leg - Lateral Secondary Diabetic Wound/Ulcer of the Lower Wounding Event: Gradually Appeared Etiology: Extremity Date Acquired: 01/07/2017 Wound Open Weeks Of Treatment: 2 Status: Clustered Wound: No Comorbid Cataracts, Anemia, Sleep Apnea, History: Hypertension, Cirrhosis , Type II Diabetes Photos Photo Uploaded By: Montey Hora on 05/07/2017 17:32:27 Wound Measurements Length: (cm) 0.8 Width: (cm) 1 Depth: (cm) 0.2 Area: (cm) 0.628 Volume: (cm) 0.126 % Reduction in Area: 33.3% %  Reduction in Volume: 33% Epithelialization: None Tunneling: No Undermining: No Wound Description Full Thickness Without Classification: Exposed Support Structures Diabetic Severity Grade 1 (Wagner): Wound Margin: Flat and Intact Exudate Amount: Large Exudate Type: Serous Exudate Color: amber Corey West, Corey West ([id number]) Foul Odor After Cleansing: No Slough/Fibrino Yes Wound Bed Granulation Amount: Small (1-33%) Exposed Structure Granulation Quality: Pink, Pale Fascia Exposed: No Necrotic Amount: Large (67-100%) Fat Layer (Subcutaneous Tissue) Exposed: Yes Necrotic Quality: Adherent Slough Tendon Exposed: No Muscle Exposed: No Joint Exposed: No Bone Exposed: No Periwound Skin Texture Texture Color No Abnormalities Noted: No No Abnormalities Noted: No Erythema: Yes Moisture Erythema Location: Circumferential No Abnormalities Noted: No Wound Preparation Ulcer Cleansing: Rinsed/Irrigated with Saline Topical Anesthetic Applied: None Treatment Notes Wound #4 (Right, Lateral Lower Leg) 1. Cleansed with: Clean wound with Normal Saline Cleanse wound with antibacterial soap and water 2. Anesthetic Topical Lidocaine 4% cream to wound bed prior to debridement 4. Dressing Applied: Aquacel Ag 5. Secondary Dressing Applied ABD Pad 7. Secured with Engineer, drilling to Right Lower Extremity Notes xtrasorb Electronic Signature(s) Signed: 05/07/2017 5:55:03 PM By: Montey Hora Entered By: Montey Hora on 05/07/2017 13:15:09 Corey West ([id number]) -------------------------------------------------------------------------------- [place] Details Patient Name: Corey West Date of Service: 05/07/2017 12:30 PM Medical Record Number: [id number] Patient Account Number: [id number] Date of Birth/Sex: August 31, 1951 (65 y.o. Male) Treating RN: Montey Hora Primary Care Hermann Dottavio: Lynett Fish Other Clinician: Referring Zoria Rawlinson: Lynett Fish Treating Tawnee Clegg/Extender: Melburn Hake, HOYT Weeks in Treatment: 2 Vital Signs Time Taken: 13:02 Temperature (F): 98.4 Height (in): 69 Pulse (bpm): 79 Weight (lbs): 305.4 Respiratory Rate (breaths/min): 20 Body Mass Index (BMI): 45.1 Blood Pressure (mmHg): 154/49 Reference Range: 80 - 120 mg / dl Electronic Signature(s) Signed: 05/07/2017 5:55:03 PM By: Montey Hora Entered By: Montey Hora on 05/07/2017 13:04:44

## 2017-05-09 ENCOUNTER — Ambulatory Visit: Payer: Medicare Other | Admitting: Occupational Therapy

## 2017-05-13 ENCOUNTER — Ambulatory Visit: Payer: Medicare Other | Admitting: Occupational Therapy

## 2017-05-14 ENCOUNTER — Ambulatory Visit: Payer: Medicare Other | Admitting: Occupational Therapy

## 2017-05-15 ENCOUNTER — Encounter: Payer: Medicare Other | Admitting: Physician Assistant

## 2017-05-15 DIAGNOSIS — E11622 Type 2 diabetes mellitus with other skin ulcer: Secondary | ICD-10-CM | POA: Diagnosis not present

## 2017-05-17 NOTE — Progress Notes (Signed)
NAHSIR, VENEZIA ([id number]) Visit Report for 05/15/2017 Chief Complaint Document Details Patient Name: Corey West, Corey West. Date of Service: 05/15/2017 11:15 AM Medical Record Number: [id number] Patient Account Number: [id number] Date of Birth/Sex: 1950-10-06 (66 y.o. Male) Treating RN: Cornell Barman Primary Care Provider: Lynett Fish Other Clinician: Referring Provider: Lynett Fish Treating Provider/Extender: Worthy Keeler Weeks in Treatment: 3 Information Obtained from: Patient Chief Complaint LLE VSU since Jan 2015 04/23/17; patient is here for review of wounds on his right lateral and right posterior calf Electronic Signature(s) Signed: 05/16/2017 12:37:32 AM By: Worthy Keeler Corey West Entered By: Worthy Keeler on 05/15/2017 23:45:44 Corey West ([id number]) -------------------------------------------------------------------------------- HPI Details Patient Name: Corey West Date of Service: 05/15/2017 11:15 AM Medical Record Number: [id number] Patient Account Number: [id number] Date of Birth/Sex: 15-Jan-1951 (66 y.o. Male) Treating RN: Cornell Barman Primary Care Provider: Lynett Fish Other Clinician: Referring Provider: Lynett Fish Treating Provider/Extender: Melburn Hake, Ilisa Hayworth Weeks in Treatment: 3 History of Present Illness HPI Description: Pleasant 65 year old with a past medical history significant for morbid obesity, insulin- dependent type 2 diabetes (hemoglobin A1c 7.2 in January 2015), cirrhosis, and chronic kidney disease. He reports a left anterior calf ulcer since January 2015. He has had venous stasis ulcers in the past, which have healed spontaneously. No history of DVT. Does not regularly wear compression. Ambulating normally per his baseline. Recently completed a course of antibiotics. Treated with a Profore light and Coban 2 layer compression drainage, which both fell down and he cut off. Tolerated Coban lite compression bandage. Returns to clinic for  followup. No complaints today. No pain at this time. No fever or chills. No open wounds at this time. He is having difficulty finding compression garments that fit him. 04/23/17; this is now 66 year old man who returns to our clinic for nonhealing wounds on the right lateral and right posterior calf. He tells me that they have been present for 6 months. In the setting of severe right greater than left lower extremity lymphedema likely secondary to chronic venous insufficiency. He has been followed in the lymphedema clinic at least since early June with compression wraps although these have not really helped and the patient is here for our review of this situation. The patient has had problems with edema in his legs for years. He has not worn compression. He is type II diabetic that sounds poorly controlled. He also has obstructive sleep apnea and cirrhosis. He is occasional cigar smoker 04/30/17; patient is a 66 year old man who has severe bilateral lower extremity lymphedema stage III since his early 1s he tells me. This is no doubt secondary to chronic venous insufficiency. He was going to the lymphedema clinic who forwarded him here due to skin breakdown in the right leg. He has not previously worn compression stockings. He has a large area of denuded epithelium on the posterior leg. He has a more open wound on the right lateral leg 05/07/17 on evaluation today patient appears to be doing about the same his wounds are showing signs of improvement and looking fairly well. With that being said he is not having any severe discomfort just some mild aching due to the swelling and really this is worse on the left than the right and the right side is where he actually has his wounds. He has been tolerating the [employer] wraps without complication and states this is actually much better than the wraps that he was getting at the lymphedema clinic most recently.  He is happy with these wraps. He has no  nausea, vomiting, diarrhea and no fevers or chills. We are still waiting to hear back in regard to the lymphedema pumps which I do think would be beneficial for him even with the compression wraps he still has a significant amount of edema noted of the bilateral lower extremities. 05/15/17 on evaluation today patient's right lower extremity edema and weeping especially the posterior portion appears to be doing better. He still is having some issue here but fortunately it's minimal compared to previous weeks. He continues to tolerate the compression fairly well. Overall he is pleased with how things are progressing. No fevers, chills, nausea, or vomiting noted at this time. And patient is having only minimal discomfort. Corey West, Corey West ([id number]) Electronic Signature(s) Signed: 05/16/2017 12:37:32 AM By: Worthy Keeler Corey West Entered By: Worthy Keeler on 05/15/2017 23:47:02 Corey West, Corey West ([id number]) -------------------------------------------------------------------------------- Physical Exam Details Patient Name: Corey West, Corey West. Date of Service: 05/15/2017 11:15 AM Medical Record Number: [id number] Patient Account Number: [id number] Date of Birth/Sex: 11/15/1950 (66 y.o. Male) Treating RN: Cornell Barman Primary Care Provider: Lynett Fish Other Clinician: Referring Provider: Lynett Fish Treating Provider/Extender: Melburn Hake, Erica Richwine Weeks in Treatment: 3 Constitutional Well-nourished and well-hydrated in no acute distress. Respiratory normal breathing without difficulty. clear to auscultation bilaterally. Cardiovascular regular rate and rhythm with normal S1, S2. Psychiatric this patient is able to make decisions and demonstrates good insight into disease process. Alert and Oriented x 3. pleasant and cooperative. Notes Patient's wound on the posterior right lower extremity appears indistinct and there is weeping but no significant bleeding and no evidence of infection such as  purulent discharge. There is erythema noted. Electronic Signature(s) Signed: 05/16/2017 12:37:32 AM By: Worthy Keeler Corey West Entered By: Worthy Keeler on 05/15/2017 23:47:37 Corey West ([id number]) -------------------------------------------------------------------------------- Physician Orders Details Patient Name: Corey West, Corey A. Date of Service: 05/15/2017 11:15 AM Medical Record Number: [id number] Patient Account Number: [id number] Date of Birth/Sex: August 03, 1951 (66 y.o. Male) Treating RN: Cornell Barman Primary Care Provider: Lynett Fish Other Clinician: Referring Provider: Lynett Fish Treating Provider/Extender: Sharalyn Ink in Treatment: 3 Verbal / Phone Orders: Yes Clinician: Cornell Barman Read Back and Verified: Yes Diagnosis Coding ICD-10 Coding Code Description I89.0 Lymphedema, not elsewhere classified L97.211 Non-pressure chronic ulcer of right calf limited to breakdown of skin Chronic venous hypertension (idiopathic) with ulcer and inflammation of right lower I87.331 extremity E11.622 Type 2 diabetes mellitus with other skin ulcer Wound Cleansing Wound #3 Right Lower Leg o May Shower, gently pat wound dry prior to applying new dressing. Wound #4 Right,Lateral Lower Leg o May Shower, gently pat wound dry prior to applying new dressing. Skin Barriers/Peri-Wound Care Wound #3 Right Lower Leg o Barrier cream Wound #4 Right,Lateral Lower Leg o Barrier cream Primary Wound Dressing Wound #3 Right Lower Leg o Aquacel Ag Wound #4 Right,Lateral Lower Leg o Aquacel Ag Secondary Dressing Wound #3 Right Lower Leg o ABD pad o XtraSorb Wound #4 Right,Lateral Lower Leg o ABD pad Corey West, Corey West ([id number]) o XtraSorb Dressing Change Frequency Wound #3 Right Lower Leg o Change dressing every week Wound #4 Right,Lateral Lower Leg o Change dressing every week Follow-up Appointments Wound #3 Right Lower Leg o Return Appointment  in 1 week. Wound #4 Right,Lateral Lower Leg o Return Appointment in 1 week. Edema Control Wound #3 Right Lower Leg o Unna Boot to Right Lower Extremity o Compression Pump: Use compression pump on left  lower extremity for 30 minutes, twice daily. Wound #4 Right,Lateral Lower Leg o Unna Boot to Right Lower Extremity o Compression Pump: Use compression pump on left lower extremity for 30 minutes, twice daily. Additional Orders / Instructions Wound #3 Right Lower Leg o Increase protein intake. o Other: - Get Blood Sugars below 150 Wound #4 Right,Lateral Lower Leg o Increase protein intake. o Other: - Get Blood Sugars below 150 Electronic Signature(s) Signed: 05/16/2017 12:37:32 AM By: Worthy Keeler Corey West Signed: 05/16/2017 7:32:44 AM By: Gretta Cool, BSN, RN, CWS, Kim RN, BSN Entered By: Gretta Cool, BSN, RN, CWS, Kim on 05/15/2017 12:17:04 Corey West ([id number]) -------------------------------------------------------------------------------- Problem List Details Patient Name: Corey West, Corey West. Date of Service: 05/15/2017 11:15 AM Medical Record Number: [id number] Patient Account Number: [id number] Date of Birth/Sex: 1950/12/01 (66 y.o. Male) Treating RN: Cornell Barman Primary Care Provider: Lynett Fish Other Clinician: Referring Provider: Lynett Fish Treating Provider/Extender: Melburn Hake, Carollynn Pennywell Weeks in Treatment: 3 Active Problems ICD-10 Encounter Code Description Active Date Diagnosis I89.0 Lymphedema, not elsewhere classified 04/23/2017 Yes L97.211 Non-pressure chronic ulcer of right calf limited to 04/23/2017 Yes breakdown of skin I87.331 Chronic venous hypertension (idiopathic) with ulcer and 04/23/2017 Yes inflammation of right lower extremity E11.622 Type 2 diabetes mellitus with other skin ulcer 04/23/2017 Yes Inactive Problems Resolved Problems Electronic Signature(s) Signed: 05/16/2017 12:37:32 AM By: Worthy Keeler Corey West Entered By: Worthy Keeler on  05/15/2017 11:53:26 Corey West ([id number]) -------------------------------------------------------------------------------- Progress Note Details Patient Name: Corey West Date of Service: 05/15/2017 11:15 AM Medical Record Number: [id number] Patient Account Number: [id number] Date of Birth/Sex: [birth year] (66 y.o. Male) Treating RN: Cornell Barman Primary Care Provider: Lynett Fish Other Clinician: Referring Provider: Lynett Fish Treating Provider/Extender: Worthy Keeler Weeks in Treatment: 3 Subjective Chief Complaint Information obtained from Patient LLE VSU since Jan 2015 04/23/17; patient is here for review of wounds on his right lateral and right posterior calf History of Present Illness (HPI) Pleasant 66 year old with a past medical history significant for morbid obesity, insulin-dependent type 2 diabetes (hemoglobin A1c 7.2 in January 2015), cirrhosis, and chronic kidney disease. He reports a left anterior calf ulcer since January 2015. He has had venous stasis ulcers in the past, which have healed spontaneously. No history of DVT. Does not regularly wear compression. Ambulating normally per his baseline. Recently completed a course of antibiotics. Treated with a Profore light and Coban 2 layer compression drainage, which both fell down and he cut off. Tolerated Coban lite compression bandage. Returns to clinic for followup. No complaints today. No pain at this time. No fever or chills. No open wounds at this time. He is having difficulty finding compression garments that fit him. 04/23/17; this is now 66 year old man who returns to our clinic for nonhealing wounds on the right lateral and right posterior calf. He tells me that they have been present for 6 months. In the setting of severe right greater than left lower extremity lymphedema likely secondary to chronic venous insufficiency. He has been followed in the lymphedema clinic at least since early June with  compression wraps although these have not really helped and the patient is here for our review of this situation. The patient has had problems with edema in his legs for years. He has not worn compression. He is type II diabetic that sounds poorly controlled. He also has obstructive sleep apnea and cirrhosis. He is occasional cigar smoker 04/30/17; patient is a 66 year old man who has severe bilateral lower extremity  lymphedema stage III since his early 64s he tells me. This is no doubt secondary to chronic venous insufficiency. He was going to the lymphedema clinic who forwarded him here due to skin breakdown in the right leg. He has not previously worn compression stockings. He has a large area of denuded epithelium on the posterior leg. He has a more open wound on the right lateral leg 05/07/17 on evaluation today patient appears to be doing about the same his wounds are showing signs of improvement and looking fairly well. With that being said he is not having any severe discomfort just some mild aching due to the swelling and really this is worse on the left than the right and the right side is where he actually has his wounds. He has been tolerating the [employer] wraps without complication and states this is actually much better than the wraps that he was getting at the lymphedema clinic most recently. He is happy with these wraps. He has no nausea, vomiting, diarrhea and no fevers or chills. We are still waiting to hear back in regard to the lymphedema pumps which I do think would be beneficial for him even with the Corey West, Corey West. ([id number]) compression wraps he still has a significant amount of edema noted of the bilateral lower extremities. 05/15/17 on evaluation today patient's right lower extremity edema and weeping especially the posterior portion appears to be doing better. He still is having some issue here but fortunately it's minimal compared to previous weeks. He continues to  tolerate the compression fairly well. Overall he is pleased with how things are progressing. No fevers, chills, nausea, or vomiting noted at this time. And patient is having only minimal discomfort. Objective Constitutional Well-nourished and well-hydrated in no acute distress. Vitals Time Taken: 11:36 AM, Height: 69 in, Weight: 305.4 lbs, BMI: 45.1, Temperature: 98.2 F, Pulse: 75 bpm, Respiratory Rate: 18 breaths/min, Blood Pressure: 147/60 mmHg. Respiratory normal breathing without difficulty. clear to auscultation bilaterally. Cardiovascular regular rate and rhythm with normal S1, S2. Psychiatric this patient is able to make decisions and demonstrates good insight into disease process. Alert and Oriented x 3. pleasant and cooperative. General Notes: Patient's wound on the posterior right lower extremity appears indistinct and there is weeping but no significant bleeding and no evidence of infection such as purulent discharge. There is erythema noted. Integumentary (Hair, Skin) Wound #3 status is Open. Original cause of wound was Gradually Appeared. The wound is located on the Right Lower Leg. The wound measures 4cm length x 4cm width x 0.1cm depth; 12.566cm^2 area and 1.257cm^3 volume. Wound #4 status is Open. Original cause of wound was Gradually Appeared. The wound is located on the Right,Lateral Lower Leg. The wound measures 0.6cm length x 0.8cm width x 0.2cm depth; 0.377cm^2 area and 0.075cm^3 volume. Corey West, Corey West ([id number]) Assessment Active Problems ICD-10 I89.0 - Lymphedema, not elsewhere classified L97.211 - Non-pressure chronic ulcer of right calf limited to breakdown of skin I87.331 - Chronic venous hypertension (idiopathic) with ulcer and inflammation of right lower extremity E11.622 - Type 2 diabetes mellitus with other skin ulcer Plan Wound Cleansing: Wound #3 Right Lower Leg: May Shower, gently pat wound dry prior to applying new dressing. Wound #4  Right,Lateral Lower Leg: May Shower, gently pat wound dry prior to applying new dressing. Skin Barriers/Peri-Wound Care: Wound #3 Right Lower Leg: Barrier cream Wound #4 Right,Lateral Lower Leg: Barrier cream Primary Wound Dressing: Wound #3 Right Lower Leg: Aquacel Ag Wound #4 Right,Lateral Lower Leg:  Aquacel Ag Secondary Dressing: Wound #3 Right Lower Leg: ABD pad XtraSorb Wound #4 Right,Lateral Lower Leg: ABD pad XtraSorb Dressing Change Frequency: Wound #3 Right Lower Leg: Change dressing every week Wound #4 Right,Lateral Lower Leg: Change dressing every week Follow-up Appointments: Wound #3 Right Lower Leg: Return Appointment in 1 week. Wound #4 Right,Lateral Lower Leg: Corey West, Corey West ([id number]) Return Appointment in 1 week. Edema Control: Wound #3 Right Lower Leg: Unna Boot to Right Lower Extremity Compression Pump: Use compression pump on left lower extremity for 30 minutes, twice daily. Wound #4 Right,Lateral Lower Leg: Unna Boot to Right Lower Extremity Compression Pump: Use compression pump on left lower extremity for 30 minutes, twice daily. Additional Orders / Instructions: Wound #3 Right Lower Leg: Increase protein intake. Other: - Get Blood Sugars below 150 Wound #4 Right,Lateral Lower Leg: Increase protein intake. Other: - Get Blood Sugars below 150 I'm going to recommend that we continue with the Current wound care measures per above. We will see were things stand in one week. If anything worsens manner patient will contact our office for additional recommendations. Electronic Signature(s) Signed: 05/16/2017 12:37:32 AM By: Worthy Keeler Corey West Entered By: Worthy Keeler on 05/15/2017 23:48:13 Corey West ([id number]) -------------------------------------------------------------------------------- SuperBill Details Patient Name: Corey West, SALMINEN A. Date of Service: 05/15/2017 Medical Record Number: [id number] Patient Account Number: [id number] Date  of Birth/Sex: 02/03/1951 (66 y.o. Male) Treating RN: Cornell Barman Primary Care Provider: Lynett Fish Other Clinician: Referring Provider: Lynett Fish Treating Provider/Extender: Melburn Hake, Dugan Vanhoesen Weeks in Treatment: 3 Diagnosis Coding ICD-10 Codes Code Description I89.0 Lymphedema, not elsewhere classified L97.211 Non-pressure chronic ulcer of right calf limited to breakdown of skin Chronic venous hypertension (idiopathic) with ulcer and inflammation of right lower I87.331 extremity E11.622 Type 2 diabetes mellitus with other skin ulcer Facility Procedures CPT4 Code: [id number] Description: (Facility Use Only) [phone] - APPLY Louretta Parma BOOT RT Modifier: Quantity: 1 Physician Procedures CPT4: Description Modifier Quantity Code [id number] - WC PHYS LEVEL 3 - EST PT 1 ICD-10 Description Diagnosis I89.0 Lymphedema, not elsewhere classified L97.211 Non-pressure chronic ulcer of right calf limited to breakdown of skin I87.331 Chronic  venous hypertension (idiopathic) with ulcer and inflammation of right lower extremity E11.622 Type 2 diabetes mellitus with other skin ulcer Electronic Signature(s) Signed: 05/16/2017 12:37:32 AM By: Worthy Keeler Corey West Entered By: Worthy Keeler on 05/15/2017 84:78:41

## 2017-05-20 ENCOUNTER — Ambulatory Visit: Payer: Medicare Other | Admitting: Occupational Therapy

## 2017-05-20 NOTE — Progress Notes (Signed)
Corey, West ([id number]) Visit Report for 05/15/2017 Arrival Information Details Patient Name: Corey West, Corey West. Date of Service: 05/15/2017 11:15 AM Medical Record Number: [id number] Patient Account Number: [id number] Date of Birth/Sex: [birth year] (66 y.o. Male) Treating West: Corey West Primary Care Corey West: Lynett Fish Other Clinician: Referring Corey West: Lynett Fish Treating Corey West/Extender: Corey West, Corey West in Treatment: 3 Visit Information History Since Last Visit Added or deleted any medications: No Patient Arrived: Corey West Any new allergies or adverse reactions: No Arrival Time: 11:35 Had a fall or experienced change in No Accompanied By: self activities of daily living that may affect Transfer Assistance: None risk of falls: Patient Identification Verified: Yes Signs or symptoms of abuse/neglect since last No Secondary Verification Process Completed: Yes visito Has Dressing in Place as Prescribed: Yes Has Compression in Place as Prescribed: Yes Pain Present Now: No Electronic Signature(s) Signed: 05/16/2017 7:32:44 AM By: Corey West, BSN, West, Corey West, BSN Entered By: Corey West, BSN, West, CWS, Kim on 05/15/2017 11:36:33 Corey West ([id number]) -------------------------------------------------------------------------------- Encounter Discharge Information Details Patient Name: Corey West, Corey West A. Date of Service: 05/15/2017 11:15 AM Medical Record Number: [id number] Patient Account Number: [id number] Date of Birth/Sex: [birth year] (66 y.o. Male) Treating West: Corey West Primary Care Augusten Lipkin: Lynett Fish Other Clinician: Referring Maghen Group: Lynett Fish Treating Corey West/Extender: Corey West, Corey West in Treatment: 3 Encounter Discharge Information Items Discharge Pain Level: 0 Discharge Condition: Stable Ambulatory Status: Cane Discharge Destination: Home Transportation: Private Auto Accompanied By: self Schedule Follow-up Appointment: Yes Medication  Reconciliation completed No and provided to Patient/Care Jinnifer Montejano: Provided on Clinical Summary of Care: 05/15/2017 Form Type Recipient Paper Patient [name] Signature(s) Signed: 05/16/2017 10:14:57 AM By: Corey West Entered By: Corey West on 05/15/2017 12:26:44 Corey West ([id number]) -------------------------------------------------------------------------------- Lower Extremity Assessment Details Patient Name: Corey West A. Date of Service: 05/15/2017 11:15 AM Medical Record Number: [id number] Patient Account Number: [id number] Date of Birth/Sex: 1951-10-01 (66 y.o. Male) Treating West: Corey West Primary Care Red Mandt: Lynett Fish Other Clinician: Referring Anniece Bleiler: Lynett Fish Treating Corey West/Extender: Corey West, Corey West in Treatment: 3 Edema Assessment Assessed: [Left: No] [Right: No] E[Left: dema] [Right: :] Calf Left: Right: Point of Measurement: 32 cm From Medial Instep cm 49 cm Ankle Left: Right: Point of Measurement: 13 cm From Medial Instep cm 30 cm Vascular Assessment Pulses: Dorsalis Pedis Palpable: [Right:Yes] Posterior Tibial Extremity colors, hair growth, and conditions: Extremity Color: [Right:Mottled] Hair Growth on Extremity: [Right:No] Temperature of Extremity: [Right:Warm] Capillary Refill: [Right:< 3 seconds] Toe Nail Assessment Left: Right: Thick: No Discolored: Yes Deformed: Yes Improper Length and Hygiene: Yes Electronic Signature(s) Signed: 05/16/2017 7:32:44 AM By: Corey West, BSN, West, Corey West, BSN Entered By: Corey West, BSN, West, CWS, Kim on 05/15/2017 11:46:39 Corey West ([id number]) -------------------------------------------------------------------------------- Multi Wound Chart Details Patient Name: Corey West A. Date of Service: 05/15/2017 11:15 AM Medical Record Number: [id number] Patient Account Number: [id number] Date of Birth/Sex: Corey West (66 y.o. Male) Treating West: Corey West Primary Care Fiora Weill:  Lynett Fish Other Clinician: Referring Tayshawn Purnell: Lynett Fish Treating Corey West/Extender: Corey West, Corey West in Treatment: 3 Vital Signs Height(in): 69 Pulse(bpm): 75 Weight(lbs): 305.4 Blood Pressure 147/60 (mmHg): Body Mass Index(BMI): 45 Temperature(F): 98.2 Respiratory Rate 18 (breaths/min): Photos: [3:No Photos] [4:No Photos] [N/A:N/A] Wound Location: [3:Right Lower Leg] [4:Right, Lateral Lower Leg] [N/A:N/A] Wounding Event: [3:Gradually Appeared] [4:Gradually Appeared] [N/A:N/A] Primary Etiology: [3:Lymphedema] [4:Lymphedema] [N/A:N/A] 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] [N/A:N/A] Date Acquired: [  3:01/14/2017] [4:01/07/2017] [N/A:N/A] West of Treatment: [3:3] [4:3] [N/A:N/A] Wound Status: [3:Open] [4:Open] [N/A:N/A] Measurements L x W x D 4x4x0.1 [4:0.6x0.8x0.2] [N/A:N/A] (cm) Area (cm) : [3:12.566] [4:0.377] [N/A:N/A] Volume (cm) : [3:1.257] [4:0.075] [N/A:N/A] % Reduction in Area: [3:67.30%] [4:60.00%] [N/A:N/A] % Reduction in Volume: 96.70% [4:60.10%] [N/A:N/A] Classification: [3:Partial Thickness] [4:Full Thickness Without Exposed Support Structures] [N/A:N/A] Periwound Skin Texture: No Abnormalities Noted [4:No Abnormalities Noted] [N/A:N/A] Periwound Skin [3:No Abnormalities Noted] [4:No Abnormalities Noted] [N/A:N/A] Moisture: Periwound Skin Color: No Abnormalities Noted [4:No Abnormalities Noted] [N/A:N/A] Tenderness on [3:No] [4:No] [N/A:N/A] Treatment Notes Electronic Signature(s) Signed: 05/16/2017 7:32:44 AM By: Corey West, BSN, West, Corey West, BSN [address] [place] ([id number]) Entered By: Corey West, BSN, West, CWS, Kim on 05/15/2017 12:15:25 Corey West ([id number]) -------------------------------------------------------------------------------- [place] Details Patient Name: Corey, West A. Date of Service: 05/15/2017 11:15 AM Medical Record Number:  [id number] Patient Account Number: [id number] Date of Birth/Sex: [birth year] (66 y.o. Male) Treating West: Corey West Primary Care Jaycelynn Knickerbocker: Lynett Fish Other Clinician: Referring Lesly Pontarelli: Lynett Fish Treating Caitlen Worth/Extender: Corey West, Corey West in Treatment: 3 Active Inactive ` Orientation to the Wound Care Program Nursing Diagnoses: Knowledge deficit related to the wound healing center program Goals: Patient/caregiver will verbalize understanding of the [facility] Date Initiated: 04/23/2017 Target Resolution Date: 05/04/2017 Goal Status: Active Interventions: Provide education on orientation to the wound center Notes: ` Venous Leg Ulcer Nursing Diagnoses: Potential for venous Insuffiency (use before diagnosis confirmed) Goals: Patient will maintain optimal edema control Date Initiated: 04/23/2017 Target Resolution Date: 05/04/2017 Goal Status: Active Interventions: Compression as ordered Treatment Activities: Therapeutic compression applied : 04/23/2017 Notes: ` Wound/Skin Impairment Corey West, Corey West ([id number]) Nursing Diagnoses: Impaired tissue integrity Goals: Ulcer/skin breakdown will have a volume reduction of 80% by week 12 Date Initiated: 04/23/2017 Target Resolution Date: 05/04/2017 Goal Status: Active Interventions: Assess ulceration(s) every visit Notes: Electronic Signature(s) Signed: 05/16/2017 7:32:44 AM By: Corey West, BSN, West, Corey West, BSN Entered By: Corey West, BSN, West, CWS, Kim on 05/15/2017 11:48:21 Corey West ([id number]) -------------------------------------------------------------------------------- Pain Assessment Details Patient Name: Corey West, Corey West A. Date of Service: 05/15/2017 11:15 AM Medical Record Number: [id number] Patient Account Number: [id number] Date of Birth/Sex: [birth year]West (66 y.o. Male) Treating West: Corey West Primary Care Nicolus Ose: Lynett Fish Other Clinician: Referring Jamya Starry: Lynett Fish Treating  Amor Hyle/Extender: Corey West, Corey West in Treatment: 3 Active Problems Location of Pain Severity and Description of Pain Patient Has Paino No Site Locations With Dressing Change: No Pain Management and Medication Current Pain Management: Goals for Pain Management Topical or injectable lidocaine is offered to patient for acute pain when surgical debridement is performed. If needed, Patient is instructed to use over the counter pain medication for the following 24-48 hours after debridement. Wound care MDs do not prescribed pain medications. Patient has chronic pain or uncontrolled pain. Patient has been instructed to make an appointment with their Primary Care Physician for pain management. Electronic Signature(s) Signed: 05/16/2017 7:32:44 AM By: Corey West, BSN, West, Corey West, BSN Entered By: Corey West, BSN, West, CWS, Kim on 05/15/2017 11:36:41 Corey West ([id number]) -------------------------------------------------------------------------------- Patient/Caregiver Education Details Patient Name: Corey West, Corey West A. Date of Service: 05/15/2017 11:15 AM Medical Record Number: [id number] Patient Account Number: [id number] Date of Birth/Gender: [birth year] (66 y.o. Male) Treating West: Corey West Primary Care Physician: Lynett Fish Other Clinician: Referring Physician: Lynett Fish Treating Physician/Extender: Sharalyn Ink in Treatment: 3 Education Assessment Education Provided To: Patient Education Topics Provided Wound/Skin Impairment: Handouts:  Other: change dressing as ordered Methods: Demonstration, Explain/Verbal Responses: State content correctly Electronic Signature(s) Signed: 05/16/2017 7:32:44 AM By: Corey West, BSN, West, Corey West, BSN Entered By: Corey West, BSN, West, CWS, Kim on 05/15/2017 12:18:27 Corey West, Corey West ([id number]) -------------------------------------------------------------------------------- Wound Assessment Details Patient Name: Corey West, Corey West A. Date of  Service: 05/15/2017 11:15 AM Medical Record Number: [id number] Patient Account Number: [id number] Date of Birth/Sex: [birth year]West (66 y.o. Male) Treating West: Corey West Primary Care Jams Trickett: Lynett Fish Other Clinician: Referring Jaylia Pettus: Lynett Fish Treating Audray Rumore/Extender: Corey West, Corey West in Treatment: 3 Wound Status Wound Number: 3 Primary Etiology: Lymphedema Wound Location: Right Lower Leg Secondary Diabetic Wound/Ulcer of the Lower Etiology: Extremity Wounding Event: Gradually Appeared Wound Status: Open Date Acquired: 01/14/2017 West Of Treatment: 3 Clustered Wound: No Photos Photo Uploaded By: Corey West, BSN, West, CWS, Kim on 05/15/2017 14:00:30 Wound Measurements Length: (cm) 4 Width: (cm) 4 Depth: (cm) 0.1 Area: (cm) 12.566 Volume: (cm) 1.257 % Reduction in Area: 67.3% % Reduction in Volume: 96.7% Wound Description Classification: Partial Thickness Periwound Skin Texture Texture Color No Abnormalities Noted: No No Abnormalities Noted: No Moisture No Abnormalities Noted: No Treatment Notes Wound #3 (Right Lower Leg) 1. Cleansed with: Clean wound with Normal Saline Cleanse wound with antibacterial soap and water Corey West, Corey West A. ([id number]) 2. Anesthetic Topical Lidocaine 4% cream to wound bed prior to debridement 4. Dressing Applied: Aquacel Ag 5. Secondary Dressing Applied ABD Pad 7. Secured with Catering manager Bilaterally Notes Manufacturing systems engineer) Signed: 05/16/2017 7:32:44 AM By: Corey West, BSN, West, Corey West, BSN Entered By: Corey West, BSN, West, CWS, Kim on 05/15/2017 11:44:18 Corey West, Corey West ([id number]) -------------------------------------------------------------------------------- Wound Assessment Details Patient Name: Corey West, Corey West A. Date of Service: 05/15/2017 11:15 AM Medical Record Number: [id number] Patient Account Number: [id number] Date of Birth/Sex: [birth year]West (66 y.o. Male) Treating West: Corey West Primary Care  Maddeline Roorda: Lynett Fish Other Clinician: Referring Messiyah Waterson: Lynett Fish Treating Kesha Hurrell/Extender: Corey West, Corey West in Treatment: 3 Wound Status Wound Number: 4 Primary Etiology: Lymphedema Wound Location: Right, Lateral Lower Leg Secondary Diabetic Wound/Ulcer of the Lower Etiology: Extremity Wounding Event: Gradually Appeared Wound Status: Open Date Acquired: 01/07/2017 West Of Treatment: 3 Clustered Wound: No Photos Photo Uploaded By: Corey West, BSN, West, CWS, Kim on 05/15/2017 14:00:31 Wound Measurements Length: (cm) 0.6 Width: (cm) 0.8 Depth: (cm) 0.2 Area: (cm) 0.377 Volume: (cm) 0.075 % Reduction in Area: 60% % Reduction in Volume: 60.1% Wound Description Full Thickness Without Exposed Classification: Support Structures Periwound Skin Texture Texture Color No Abnormalities Noted: No No Abnormalities Noted: No Moisture No Abnormalities Noted: No Treatment Notes Wound #4 (Right, Lateral Lower Leg) 1. Cleansed with: Clean wound with Normal Saline Corey West, Corey West A. ([id number]) Cleanse wound with antibacterial soap and water 2. Anesthetic Topical Lidocaine 4% cream to wound bed prior to debridement 4. Dressing Applied: Aquacel Ag 5. Secondary Dressing Applied ABD Pad 7. Secured with Catering manager Bilaterally Notes Manufacturing systems engineer) Signed: 05/16/2017 7:32:44 AM By: Corey West, BSN, West, Corey West, BSN Entered By: Corey West, BSN, West, CWS, Kim on 05/15/2017 11:44:18 Corey West ([id number]) -------------------------------------------------------------------------------- [place] Details Patient Name: ADYAN, PALAU A. Date of Service: 05/15/2017 11:15 AM Medical Record Number: [id number] Patient Account Number: [id number] Date of Birth/Sex: [birth year] (66 y.o. Male) Treating West: Corey West Primary Care Kali Ambler: Lynett Fish Other Clinician: Referring Avamarie Crossley: Lynett Fish Treating Krishay Faro/Extender: Corey West, Corey West in  Treatment: 3 Vital Signs Time Taken: 11:36 Temperature (F): 98.2 Height (in): 69 Pulse (  bpm): 75 Weight (lbs): 305.4 Respiratory Rate (breaths/min): 18 Body Mass Index (BMI): 45.1 Blood Pressure (mmHg): 147/60 Reference Range: 80 - 120 mg / dl Electronic Signature(s) Signed: 05/16/2017 7:32:44 AM By: Corey West, BSN, West, Corey West, BSN Entered By: Corey West, BSN, West, CWS, Kim on 05/15/2017 11:38:18

## 2017-05-21 ENCOUNTER — Ambulatory Visit: Payer: Medicare Other | Admitting: Occupational Therapy

## 2017-05-22 ENCOUNTER — Encounter: Payer: Medicare Other | Admitting: Internal Medicine

## 2017-05-22 DIAGNOSIS — E11622 Type 2 diabetes mellitus with other skin ulcer: Secondary | ICD-10-CM | POA: Diagnosis not present

## 2017-05-23 ENCOUNTER — Ambulatory Visit: Payer: Medicare Other | Admitting: Occupational Therapy

## 2017-05-23 NOTE — Progress Notes (Signed)
West, Corey ([id number]) Visit Report for 05/22/2017 HPI Details Patient Name: Corey West, Corey West. Date of Service: 05/22/2017 12:30 PM Medical Record Number: [id number] Patient Account Number: [id number] Date of Birth/Sex: 1951-09-11 (66 y.o. Male) Treating RN: Cornell West Primary Care Provider: Lynett Fish Other Clinician: Referring Provider: Lynett Fish Treating Provider/Extender: Tito Dine in Treatment: 4 History of Present Illness HPI Description: Pleasant 66 year old with a past medical history significant for morbid obesity, insulin- dependent type 2 diabetes (hemoglobin A1c 7.2 in January 2015), cirrhosis, and chronic kidney disease. He reports a left anterior calf ulcer since January 2015. He has had venous stasis ulcers in the past, which have healed spontaneously. No history of DVT. Does not regularly wear compression. Ambulating normally per his baseline. Recently completed a course of antibiotics. Treated with a Profore light and Coban 2 layer compression drainage, which both fell down and he cut off. Tolerated Coban lite compression bandage. Returns to clinic for followup. No complaints today. No pain at this time. No fever or chills. No open wounds at this time. He is having difficulty finding compression garments that fit him. 04/23/17; this is now 66 year old man who returns to our clinic for nonhealing wounds on the right lateral and right posterior calf. He tells me that they have been present for 6 months. In the setting of severe right greater than left lower extremity lymphedema likely secondary to chronic venous insufficiency. He has been followed in the lymphedema clinic at least since early June with compression wraps although these have not really helped and the patient is here for our review of this situation. The patient has had problems with edema in his legs for years. He has not worn compression. He is type II diabetic that sounds  poorly controlled. He also has obstructive sleep apnea and cirrhosis. 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 This is no doubt secondary to chronic venous insufficiency. He was going to the lymphedema clinic who forwarded him here due to skin breakdown in the right leg. He has not previously worn compression stockings. He has a large area of denuded epithelium on the posterior leg. He has a more open wound on the right lateral leg 05/07/17 on evaluation today patient appears to be doing about the same his wounds are showing signs of improvement and looking fairly well. With that being said he is not having any severe discomfort just some mild aching due to the swelling and really this is worse on the left than the right and the right side is where he actually has his wounds. He has been tolerating the [employer] wraps without complication and states this is actually much better than the wraps that he was getting at the lymphedema clinic most recently. He is happy with these wraps. He has no nausea, vomiting, diarrhea and no fevers or chills. We are still waiting to hear back in regard to the lymphedema pumps which I do think would be beneficial for him even with the compression wraps he still has a significant amount of edema noted of the bilateral lower extremities. 05/15/17 on evaluation today patient's right lower extremity edema and weeping especially the posterior portion appears to be doing better. He still is having some issue here but fortunately it's minimal compared MARSELINO, SLAYTON A. ([id number]) to previous weeks. He continues to tolerate the compression fairly well. Overall he  is pleased with how things are progressing. No fevers, chills, nausea, or vomiting noted at this time. And patient is having only minimal discomfort. 05/22/17; the medial wound on the right leg  actually looks considerably better and is epithelialized. He still has a small punched out area on the right lateral leg. Edema control is marginal end of the [employer]. We are working on getting him compression pumps Electronic Signature(s) Signed: 05/22/2017 5:02:24 PM By: Linton Ham MD Entered By: Linton Ham on 05/22/2017 13:32:48 [name] ([id number]) -------------------------------------------------------------------------------- Physical Exam Details Patient Name: West, Corey A. Date of Service: 05/22/2017 12:30 PM Medical Record Number: [id number] Patient Account Number: [id number] Date of Birth/Sex: Mar 14, 1951 (66 y.o. Male) Treating RN: Cornell West Primary Care Provider: Lynett Fish Other Clinician: Referring Provider: Lynett Fish Treating Provider/Extender: Tito Dine in Treatment: 4 Constitutional Patient is hypertensive.. Pulse regular and within target range for patient.Marland Kitchen Respirations regular, non-labored and within target range.. Temperature is normal and within the target range for the patient.Marland Kitchen appears in no distress. Eyes Conjunctivae clear. No discharge. Respiratory Respiratory effort is easy and symmetric bilaterally. Rate is normal at rest and on room air.. Cardiovascular Pedal pulses palpable and strong bilaterally.. Edema present in both extremities. Lymphedema chronic venous insufficiency. Lymphatic None palpable in the right popliteal or inguinal area. Psychiatric No evidence of depression, anxiety, or agitation. Calm, cooperative, and communicative. Appropriate interactions and affect.. Notes Wound exam; right lateral lower extremity wound is a small punched out area. Still weeping edema and a nonviable surface. The area on the medial part of the right leg is much more stable and I remember and I think is epithelialized Electronic Signature(s) Signed: 05/22/2017 5:02:24 PM By: Linton Ham MD Entered By: Linton Ham on 05/22/2017 13:35:19 [name] ([id number]) -------------------------------------------------------------------------------- Physician Orders Details Patient Name: Corey Cower A. Date of Service: 05/22/2017 12:30 PM Medical Record Number: [id number] Patient Account Number: [id number] Date of Birth/Sex: October 23, 1950 (66 y.o. Male) Treating RN: Cornell West Primary Care Provider: Lynett Fish Other Clinician: Referring Provider: Lynett Fish Treating Provider/Extender: Tito Dine in Treatment: 4 Verbal / Phone Orders: No Diagnosis Coding ICD-10 Coding Code Description I89.0 Lymphedema, not elsewhere classified L97.211 Non-pressure chronic ulcer of right calf limited to breakdown of skin Chronic venous hypertension (idiopathic) with ulcer and inflammation of right lower I87.331 extremity E11.622 Type 2 diabetes mellitus with other skin ulcer I89.9 Noninfective disorder of lymphatic vessels and lymph nodes, unspecified Wound Cleansing Wound #3 Right Lower Leg o May Shower, gently pat wound dry prior to applying new dressing. Wound #4 Right,Lateral Lower Leg o May Shower, gently pat wound dry prior to applying new dressing. Anesthetic Wound #4 Right,Lateral Lower Leg o Topical Lidocaine 4% cream applied to wound bed prior to debridement Primary Wound Dressing Wound #3 Right Lower Leg o Aquacel Ag Wound #4 Right,Lateral Lower Leg o Aquacel Ag Secondary Dressing Wound #3 Right Lower Leg o ABD pad Wound #4 Right,Lateral Lower Leg o ABD pad Dressing Change Frequency MAHMUD, KEITHLY ([id number]) Wound #3 Right Lower Leg o Change dressing every week Wound #4 Right,Lateral Lower Leg o Change dressing every week Follow-up Appointments Wound #3 Right Lower Leg o Return Appointment in 1 week. Wound #4 Right,Lateral Lower Leg o Return Appointment in 1 week. Edema Control Wound #3 Right Lower Leg o Unna Boot to Right Lower  Extremity o Compression Pump: Use compression pump on left lower extremity for 30 minutes, twice daily. Wound #4  Right,Lateral Lower Leg o Unna Boot to Right Lower Extremity o Compression Pump: Use compression pump on left lower extremity for 30 minutes, twice daily. Additional Orders / Instructions Wound #3 Right Lower Leg o Increase protein intake. o Other: - Get Blood Sugars below 150 Wound #4 Right,Lateral Lower Leg o Increase protein intake. o Other: - Get Blood Sugars below 150 Electronic Signature(s) Signed: 05/22/2017 4:57:33 PM By: Gretta Cool, BSN, RN, CWS, Kim RN, BSN Signed: 05/22/2017 5:02:24 PM By: Linton Ham MD Entered By: Gretta Cool, BSN, RN, CWS, Kim on 05/22/2017 13:23:34 JAKEN, FREGIA ([id number]) -------------------------------------------------------------------------------- Problem List Details Patient Name: MARQUEL, POTTENGER. Date of Service: 05/22/2017 12:30 PM Medical Record Number: [id number] Patient Account Number: [id number] Date of Birth/Sex: [birth year] (66 y.o. 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 to 04/23/2017 Yes breakdown of skin I87.331 Chronic venous hypertension (idiopathic) with ulcer and 04/23/2017 Yes inflammation of right lower extremity E11.622 Type 2 diabetes mellitus with other skin ulcer 04/23/2017 Yes I89.9 Noninfective disorder of lymphatic vessels and lymph 04/23/2017 Yes nodes, unspecified Inactive Problems Resolved Problems Electronic Signature(s) Signed: 05/22/2017 5:02:24 PM By: Linton Ham MD Previous Signature: 05/22/2017 10:19:11 AM Version By: Gretta Cool, BSN, RN, CWS, Kim RN, BSN Entered  By: Linton Ham on 05/22/2017 13:31:42 [name] ([id number]) -------------------------------------------------------------------------------- Progress Note Details Patient Name: [name] Date of Service: 05/22/2017 12:30 PM Medical Record Number: [id number] Patient Account Number: [id number] Date of Birth/Sex: 11/08/1950 (66 y.o. Male) Treating RN: Cornell West Primary Care Provider: Lynett Fish Other Clinician: Referring Provider: Lynett Fish Treating Provider/Extender: Tito Dine in Treatment: 4 Subjective History of Present Illness (HPI) Pleasant 66 year old with a past medical history significant for morbid obesity, insulin-dependent type 2 diabetes (hemoglobin A1c 7.2 in January 2015), cirrhosis, and chronic kidney disease. He reports a left anterior calf ulcer since January 2015. He has had venous stasis ulcers in the past, which have healed spontaneously. No history of DVT. Does not regularly wear compression. Ambulating normally per his baseline. Recently completed a course of antibiotics. Treated with a Profore light and Coban 2 layer compression drainage, which both fell down and he cut off. Tolerated Coban lite compression bandage. Returns to clinic for followup. No complaints today. No pain at this time. No fever or chills. No open wounds at this time. He is having difficulty finding compression garments that fit him. 04/23/17; this is now 66 year old man who returns to our clinic for nonhealing wounds on the right lateral and right posterior calf. He tells me that they have been present for 6 months. In the setting of severe right greater than left lower extremity lymphedema likely secondary to chronic venous insufficiency. He has been followed in the lymphedema clinic at least since early June with compression wraps although these have not really helped and the patient is here for our review of this situation. The patient has had problems  with edema in his legs for years. He has not worn compression. He is type II diabetic that sounds poorly controlled. He also has obstructive sleep apnea and cirrhosis. He is occasional cigar smoker 04/30/17; patient is a 66 year old man who has severe bilateral lower extremity lymphedema stage III since his early 31s he tells me. This is no  doubt secondary to chronic venous insufficiency. He was going to the lymphedema clinic who forwarded him here due to skin breakdown in the right leg. He has not previously worn compression stockings. He has a large area of denuded epithelium on the posterior leg. He has a more open wound on the right lateral leg 05/07/17 on evaluation today patient appears to be doing about the same his wounds are showing signs of improvement and looking fairly well. With that being said he is not having any severe discomfort just some mild aching due to the swelling and really this is worse on the left than the right and the right side is where he actually has his wounds. He has been tolerating the [employer] wraps without complication and states this is actually much better than the wraps that he was getting at the lymphedema clinic most recently. He is happy with these wraps. He has no nausea, vomiting, diarrhea and no fevers or chills. We are still waiting to hear back in regard to the lymphedema pumps which I do think would be beneficial for him even with the compression wraps he still has a significant amount of edema noted of the bilateral lower extremities. 05/15/17 on evaluation today patient's right lower extremity edema and weeping especially the posterior portion appears to be doing better. He still is having some issue here but fortunately it's minimal compared to previous weeks. He continues to tolerate the compression fairly well. Overall he is pleased with how things are progressing. No fevers, chills, nausea, or vomiting noted at this time. And patient is having  only SHAMUS, DESANTIS A. ([id number]) minimal discomfort. 05/22/17; the medial wound on the right leg actually looks considerably better and is epithelialized. He still has a small punched out area on the right lateral leg. Edema control is marginal end of the [employer]. We are working on getting him compression pumps Objective Constitutional Patient is hypertensive.. Pulse regular and within target range for patient.Marland Kitchen Respirations regular, non-labored and within target range.. Temperature is normal and within the target range for the patient.Marland Kitchen appears in no distress. Vitals Time Taken: 12:36 PM, Height: 69 in, Weight: 305.4 lbs, BMI: 45.1, Pulse: 78 bpm, Respiratory Rate: 18 breaths/min, Blood Pressure: 147/53 mmHg. Eyes Conjunctivae clear. No discharge. Respiratory Respiratory effort is easy and symmetric bilaterally. Rate is normal at rest and on room air.. Cardiovascular Pedal pulses palpable and strong bilaterally.. Edema present in both extremities. Lymphedema chronic venous insufficiency. Lymphatic None palpable in the right popliteal or inguinal area. Psychiatric No evidence of depression, anxiety, or agitation. Calm, cooperative, and communicative. Appropriate interactions and affect.. General Notes: Wound exam; right lateral lower extremity wound is a small punched out area. Still weeping edema and a nonviable surface. The area on the medial part of the right leg is much more stable and I remember and I think is epithelialized Integumentary (Hair, Skin) Wound #3 status is Open. Original cause of wound was Gradually Appeared. The wound is located on the Right Lower Leg. The wound measures 4cm length x 4cm width x 0.1cm depth; 12.566cm^2 area and 1.257cm^3 volume. KAIDYN, JAVID ([id number]) Wound #4 status is Open. Original cause of wound was Gradually Appeared. The wound is located on the Right,Lateral Lower Leg. The wound measures 0.8cm length x 1.1cm width x 0.2cm depth;  0.691cm^2 area and 0.138cm^3 volume. Assessment Active Problems ICD-10 I89.0 - Lymphedema, not elsewhere classified L97.211 - Non-pressure chronic ulcer of right calf limited to breakdown of skin I87.331 -  Chronic venous hypertension (idiopathic) with ulcer and inflammation of right lower extremity E11.622 - Type 2 diabetes mellitus with other skin ulcer I89.9 - Noninfective disorder of lymphatic vessels and lymph nodes, unspecified Plan Wound Cleansing: Wound #3 Right Lower Leg: May Shower, gently pat wound dry prior to applying new dressing. Wound #4 Right,Lateral Lower Leg: May Shower, gently pat wound dry prior to applying new dressing. Anesthetic: Wound #4 Right,Lateral Lower Leg: Topical Lidocaine 4% cream applied to wound bed prior to debridement Primary Wound Dressing: Wound #3 Right Lower Leg: Aquacel Ag Wound #4 Right,Lateral Lower Leg: Aquacel Ag Secondary Dressing: Wound #3 Right Lower Leg: ABD pad Wound #4 Right,Lateral Lower Leg: ABD pad Dressing Change Frequency: Wound #3 Right Lower Leg: Change dressing every week Wound #4 Right,Lateral Lower Leg: Change dressing every week Follow-up Appointments: RACE, LATOUR ([id number]) Wound #3 Right Lower Leg: Return Appointment in 1 week. Wound #4 Right,Lateral Lower Leg: Return Appointment in 1 week. Edema Control: Wound #3 Right Lower Leg: Unna Boot to Right Lower Extremity Compression Pump: Use compression pump on left lower extremity for 30 minutes, twice daily. Wound #4 Right,Lateral Lower Leg: Unna Boot to Right Lower Extremity Compression Pump: Use compression pump on left lower extremity for 30 minutes, twice daily. Additional Orders / Instructions: Wound #3 Right Lower Leg: Increase protein intake. Other: - Get Blood Sugars below 150 Wound #4 Right,Lateral Lower Leg: Increase protein intake. Other: - Get Blood Sugars below 150 continue with aquacel ag/ABD/UNNA Still not great edema control.  consider 4 layer compression Electronic Signature(s) Signed: 05/22/2017 5:02:24 PM By: Linton Ham MD Entered By: Linton Ham on 05/22/2017 13:37:35 [name] ([id number]) -------------------------------------------------------------------------------- Sargeant Details Patient Name: [name] Date of Service: 05/22/2017 Medical Record Number: [id number] Patient Account Number: [id number] Date of Birth/Sex: 1951/09/20 (66 y.o. Male) Treating RN: Cornell West Primary Care Provider: Lynett Fish Other Clinician: Referring Provider: Lynett Fish Treating Provider/Extender: Tito Dine in Treatment: 4 Diagnosis Coding ICD-10 Codes Code Description I89.0 Lymphedema, not elsewhere classified L97.211 Non-pressure chronic ulcer of right calf limited to breakdown of skin Chronic venous hypertension (idiopathic) with ulcer and inflammation of right lower I87.331 extremity E11.622 Type 2 diabetes mellitus with other skin ulcer I89.9 Noninfective disorder of lymphatic vessels and lymph nodes, unspecified Facility Procedures CPT4 Code: [id number] Description: (Facility Use Only) [phone] - APPLY Louretta Parma BOOT RT Modifier: Quantity: 1 Physician Procedures CPT4: Description Modifier Quantity Code [id number] - WC PHYS LEVEL 3 - EST PT 1 ICD-10 Description Diagnosis L97.211 Non-pressure chronic ulcer of right calf limited to breakdown of skin I87.331 Chronic venous hypertension (idiopathic) with ulcer and  inflammation of right lower extremity I89.0 Lymphedema, not elsewhere classified Electronic Signature(s) Signed: 05/22/2017 5:02:24 PM By: Linton Ham MD Entered By: Linton Ham on 05/22/2017 13:38:26

## 2017-05-23 NOTE — Progress Notes (Signed)
JIOVANI, MCCAMMON ([id number]) Visit Report for 05/22/2017 Arrival Information Details Patient Name: Corey West, Corey West. Date of Service: 05/22/2017 12:30 PM Medical Record Number: [id number] Patient Account Number: [id number] Date of Birth/Sex: August 19, 1951 (66 y.o. Male) Treating RN: Cornell Barman Primary Care Alysse Rathe: Lynett Fish Other Clinician: Referring Tricha Ruggirello: Lynett Fish Treating Catalyna Reilly/Extender: Tito Dine in Treatment: 4 Visit Information History Since Last Visit Added or deleted any medications: No Patient Arrived: Kasandra Knudsen Any new allergies or adverse reactions: No Arrival Time: 12:35 Had a fall or experienced change in No Accompanied By: self activities of daily living that may affect Transfer Assistance: None risk of falls: Patient Identification Verified: Yes Signs or symptoms of abuse/neglect since last No Secondary Verification Process Completed: Yes visito Hospitalized since last visit: No Has Dressing in Place as Prescribed: Yes Pain Present Now: No Electronic Signature(s) Signed: 05/22/2017 4:57:33 PM By: Gretta Cool, BSN, RN, CWS, Kim RN, BSN Entered By: Gretta Cool, BSN, RN, CWS, Kim on 05/22/2017 12:36:12 [name] ([id number]) -------------------------------------------------------------------------------- Encounter Discharge Information Details Patient Name: Corey West, Corey A. Date of Service: 05/22/2017 12:30 PM Medical Record Number: [id number] Patient Account Number: [id number] Date of Birth/Sex: 11-09-1950 (66 y.o. Male) Treating RN: Cornell Barman Primary Care Leontyne Manville: Lynett Fish Other Clinician: Referring Nell Gales: Lynett Fish Treating Chinedum Vanhouten/Extender: Tito Dine in Treatment: 4 Encounter Discharge Information Items Discharge Pain Level: 0 Discharge Condition: Stable Ambulatory Status: Cane Discharge Destination: Home Private Transportation: Auto Accompanied By: self Schedule Follow-up Appointment: Yes Medication  Reconciliation completed and Yes provided to Patient/Care Briseidy Spark: Patient Clinical Summary of Care: Declined Electronic Signature(s) Signed: 05/22/2017 4:57:33 PM By: Gretta Cool, BSN, RN, CWS, Kim RN, BSN Entered By: Gretta Cool, BSN, RN, CWS, Kim on 05/22/2017 13:24:41 [name] ([id number]) -------------------------------------------------------------------------------- Lower Extremity Assessment Details Patient Name: Corey West, Corey A. Date of Service: 05/22/2017 12:30 PM Medical Record Number: [id number] Patient Account Number: [id number] Date of Birth/Sex: Nov 17, 1950 (66 y.o. Male) Treating RN: Cornell Barman Primary Care Fred Franzen: Lynett Fish Other Clinician: Referring Tyresha Fede: Lynett Fish Treating Lion Fernandez/Extender: Tito Dine in Treatment: 4 Edema Assessment Assessed: [Left: No] [Right: No] E[Left: dema] [Right: :] Calf Left: Right: Point of Measurement: 32 cm From Medial Instep cm 48 cm Ankle Left: Right: Point of Measurement: 13 cm From Medial Instep cm 29.5 cm Vascular Assessment Pulses: Dorsalis Pedis Palpable: [Right:Yes] Posterior Tibial Extremity colors, hair growth, and conditions: Extremity Color: [Right:Hyperpigmented] Hair Growth on Extremity: [Right:No] Temperature of Extremity: [Right:Warm] Capillary Refill: [Right:< 3 seconds] Dependent Rubor: [Right:No] Blanched when Elevated: [Right:No] 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 [place] ([id number]) Entered By: Gretta Cool, BSN, RN, CWS, Kim on 05/22/2017 12:44:07 [name] ([id number]) -------------------------------------------------------------------------------- Multi Wound Chart Details Patient Name: Corey West, Corey A. Date of Service: 05/22/2017 12:30 PM Medical Record Number:  [id number] Patient Account Number: [id number] Date of Birth/Sex: [birth year] (66 y.o. Male) Treating RN: Cornell Barman Primary Care Angelito Hopping: Lynett Fish Other Clinician: Referring Genever Hentges: Lynett Fish Treating Amy Belloso/Extender: Tito Dine in Treatment: 4 Vital Signs Height(in): 69 Pulse(bpm): 78 Weight(lbs): 305.4 Blood Pressure 147/53 (mmHg): Body Mass Index(BMI): 45 Temperature(F): Respiratory Rate 18 (breaths/min): Photos: [3:No Photos] [4:No Photos] [N/A:N/A] Wound Location: [3:Right Lower Leg] [4:Right, Lateral Lower Leg] [N/A:N/A] Wounding Event: [3:Gradually Appeared] [4:Gradually Appeared] [N/A:N/A] Primary Etiology: [3:Lymphedema] [4:Lymphedema] [N/A:N/A] 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] [N/A:N/A] Date Acquired: [3:01/14/2017] [4:01/07/2017] [N/A:N/A] Weeks of Treatment: [3:4] [4:4] [N/A:N/A] Wound Status: [3:Open] [4:Open] [N/A:N/A] Measurements L x W x D 4x4x0.1 [4:0.8x1.1x0.2] [N/A:N/A] (cm) Area (cm) : [3:12.566] [4:0.691] [N/A:N/A] Volume (cm) : [3:1.257] [4:0.138] [N/A:N/A] % Reduction in Area: [3:67.30%] [4:26.60%] [N/A:N/A] % Reduction in Volume: 96.70% [4:26.60%] [N/A:N/A] Classification: [3:Partial Thickness] [4:Full Thickness Without Exposed Support Structures] [N/A:N/A] Periwound Skin Texture: No Abnormalities Noted [4:No Abnormalities Noted] [N/A:N/A] Periwound Skin [3:No Abnormalities Noted] [4:No Abnormalities Noted] [N/A:N/A] Moisture: Periwound Skin Color: No Abnormalities Noted [4:No Abnormalities Noted] [N/A:N/A] Tenderness on [3:No] [4:No] [N/A:N/A] Treatment Notes Wound #3 (Right Lower Leg) 1. Cleansed with: Cleanse wound with antibacterial soap and water PORFIRIO, BOLLIER A. ([id number]) 2. Anesthetic Topical Lidocaine 4% cream to wound bed prior to debridement 4. Dressing Applied: Aquacel Ag 5. Secondary Dressing Applied ABD Pad 7. Secured  with [employer] to Right Lower Extremity Wound #4 (Right, Lateral Lower Leg) 1. Cleansed with: Cleanse wound with antibacterial soap and water 2. Anesthetic Topical Lidocaine 4% cream to wound bed prior to debridement 4. Dressing Applied: Aquacel Ag 5. Secondary Dressing Applied ABD Pad 7. Secured with Rolena Infante to Right Lower Extremity Electronic Signature(s) Signed: 05/22/2017 5:02:24 PM By: Linton Ham MD Entered By: Linton Ham on 05/22/2017 13:31:55 [name] ([id number]) -------------------------------------------------------------------------------- [place] Details Patient Name: Corey West, Corey A. Date of Service: 05/22/2017 12:30 PM Medical Record Number: [id number] Patient Account Number: [id number] Date of Birth/Sex: 1951-04-13 (66 y.o. Male) Treating RN: Cornell Barman Primary Care Jazmen Lindenbaum: Lynett Fish Other Clinician: Referring Abdullahi Vallone: Lynett Fish Treating Daaiel Starlin/Extender: Tito Dine in Treatment: 4 Active Inactive ` Orientation to the Wound Care Program Nursing Diagnoses: Knowledge deficit related to the wound healing center program Goals: Patient/caregiver will verbalize understanding of the [facility] Program Date Initiated: 04/23/2017 Target Resolution Date: 05/04/2017 Goal Status: Active Interventions: Provide education on orientation to the wound center Notes: ` Venous Leg Ulcer Nursing Diagnoses: Potential for venous Insuffiency (use before diagnosis confirmed) Goals: Patient will maintain optimal edema control Date Initiated: 04/23/2017 Target Resolution Date: 05/04/2017 Goal Status: Active Interventions: Compression as ordered Treatment Activities: Therapeutic compression applied : 04/23/2017 Notes: ` Wound/Skin Impairment DENO, SIDA ([id number]) Nursing Diagnoses: Impaired tissue integrity Goals: Ulcer/skin breakdown will have a volume reduction of 80% by week 12 Date Initiated:  04/23/2017 Target Resolution Date: 05/04/2017 Goal Status: Active Interventions: Assess ulceration(s) every visit Notes: Electronic Signature(s) Signed: 05/22/2017 4:57:33 PM By: Gretta Cool, BSN, RN, CWS, Kim RN, BSN Entered By: Gretta Cool, BSN, RN, CWS, Kim on 05/22/2017 12:45:18 [name] ([id number]) -------------------------------------------------------------------------------- Pain Assessment Details Patient Name: Corey West, Corey A. Date of Service: 05/22/2017 12:30 PM Medical Record Number: [id number] Patient Account Number: [id number] Date of Birth/Sex: May 04, 1951 (66 y.o. Male) Treating RN: Cornell Barman Primary Care Latisia Hilaire: Lynett Fish Other Clinician: Referring Duong Haydel: Lynett Fish Treating Shaylee Stanislawski/Extender: Tito Dine in Treatment: 4 Active Problems Location of Pain Severity and Description of Pain Patient Has Paino No Site Locations With Dressing Change: No Pain Management and Medication Current Pain Management: Goals for Pain Management Topical or injectable lidocaine is offered to patient for acute pain when surgical debridement is performed. If needed, Patient is instructed to use over the counter pain medication for the following 24-48 hours after debridement. Wound care MDs do not prescribed pain medications. Patient has chronic pain or uncontrolled pain. Patient has been instructed to make an appointment with their Primary Care Physician  for pain management. Electronic Signature(s) Signed: 05/22/2017 4:57:33 PM By: Gretta Cool, BSN, RN, CWS, Kim RN, BSN Entered By: Gretta Cool, BSN, RN, CWS, Kim on 05/22/2017 12:36:21 [name] ([id number]) -------------------------------------------------------------------------------- Patient/Caregiver Education Details Patient Name: Corey West, Corey A. Date of Service: 05/22/2017 12:30 PM Medical Record Number: [id number] Patient Account Number: [id number] Date of Birth/Gender: [birth year] (66 y.o. Male) Treating RN: Cornell Barman Primary Care Physician: Lynett Fish Other Clinician: Referring Physician: Lynett Fish Treating Physician/Extender: Tito Dine in Treatment: 4 Education Assessment Education Provided To: Patient Education Topics Provided Venous: Handouts: Controlling Swelling with Multilayered Compression Wraps Methods: Demonstration Responses: State content correctly Wound/Skin Impairment: Electronic Signature(s) Signed: 05/22/2017 4:57:33 PM By: Gretta Cool, BSN, RN, CWS, Kim RN, BSN Entered By: Gretta Cool, BSN, RN, CWS, Kim on 05/22/2017 13:24:55 [name] ([id number]) -------------------------------------------------------------------------------- Wound Assessment Details Patient Name: Corey West, Corey A. Date of Service: 05/22/2017 12:30 PM Medical Record Number: [id number] Patient Account Number: [id number] Date of Birth/Sex: 1950-11-25 (66 y.o. Male) Treating RN: Cornell Barman Primary Care Chalsea Darko: Lynett Fish Other Clinician: Referring Kollen Armenti: Lynett Fish Treating Buckley Bradly/Extender: Tito Dine in Treatment: 4 Wound Status Wound Number: 3 Primary Etiology: Lymphedema Wound Location: Right Lower Leg Secondary Diabetic Wound/Ulcer of the Lower Etiology: Extremity Wounding Event: Gradually Appeared Wound Status: Open Date Acquired: 01/14/2017 Weeks Of Treatment: 4 Clustered Wound: No Photos Photo Uploaded By: Gretta Cool, BSN, RN, CWS, Kim on 05/22/2017 16:52:15 Wound Measurements Length: (cm) 4 Width: (cm) 4 Depth: (cm) 0.1 Area: (cm) 12.566 Volume: (cm) 1.257 % Reduction in Area: 67.3% % Reduction in Volume: 96.7% Wound Description Classification: Partial Thickness Periwound Skin Texture Texture Color No Abnormalities Noted: No No Abnormalities Noted: No Moisture No Abnormalities Noted: No Treatment Notes Wound #3 (Right Lower Leg) 1. Cleansed with: Cleanse wound with antibacterial soap and water 2. Anesthetic Corey West, Corey West  ([id number]) Topical Lidocaine 4% cream to wound bed prior to debridement 4. Dressing Applied: Aquacel Ag 5. Secondary Dressing Applied ABD Pad 7. Secured with Rolena Infante to Right Lower Extremity Electronic Signature(s) Signed: 05/22/2017 4:57:33 PM By: Gretta Cool, BSN, RN, CWS, Kim RN, BSN Entered By: Gretta Cool, BSN, RN, CWS, Kim on 05/22/2017 12:41:45 [name] ([id number]) -------------------------------------------------------------------------------- Wound Assessment Details Patient Name: Corey West, PALKA. Date of Service: 05/22/2017 12:30 PM Medical Record Number: [id number] Patient Account Number: [id number] Date of Birth/Sex: [birth year] (66 y.o. Male) Treating RN: Cornell Barman Primary Care Jakorian Marengo: Lynett Fish Other Clinician: Referring Voncille Simm: Lynett Fish Treating Candace Ramus/Extender: Tito Dine in Treatment: 4 Wound Status Wound Number: 4 Primary Etiology: Lymphedema Wound Location: Right, Lateral Lower Leg Secondary Diabetic Wound/Ulcer of the Lower Etiology: Extremity Wounding Event: Gradually Appeared Wound Status: Open Date Acquired: 01/07/2017 Weeks Of Treatment: 4 Clustered Wound: No Photos Photo Uploaded By: Gretta Cool, BSN, RN, CWS, Kim on 05/22/2017 16:52:15 Wound Measurements Length: (cm) 0.8 Width: (cm) 1.1 Depth: (cm) 0.2 Area: (cm) 0.691 Volume: (cm) 0.138 % Reduction in Area: 26.6% % Reduction in Volume: 26.6% Wound Description Full Thickness Without Exposed Classification: Support Structures Periwound Skin Texture Texture Color No Abnormalities Noted: No No Abnormalities Noted: No Moisture No Abnormalities Noted: No Treatment Notes Wound #4 (Right, Lateral Lower Leg) 1. Cleansed with: Cleanse wound with antibacterial soap and water Corey West, MCCAULEY A. ([id number]) 2. Anesthetic Topical Lidocaine 4% cream to wound bed prior to debridement 4. Dressing Applied: Aquacel Ag 5. Secondary Dressing Applied ABD Pad 7. Secured with Rolena Infante to Right Lower Extremity Electronic Signature(s) Signed: 05/22/2017 4:57:33  PM By: Gretta Cool, BSN, RN, CWS, Kim RN, BSN Entered By: Gretta Cool, BSN, RN, CWS, Kim on 05/22/2017 12:41:46 [name] ([id number]) -------------------------------------------------------------------------------- [place] Details Patient Name: Corey West, Corey A. Date of Service: 05/22/2017 12:30 PM Medical Record Number: [id number] Patient Account Number: [id number] Date of Birth/Sex: [birth year] (66 y.o. Male) Treating RN: Cornell Barman Primary Care Marva Hendryx: Lynett Fish Other Clinician: Referring Tamikka Pilger: Lynett Fish Treating Alanna Storti/Extender: Tito Dine in Treatment: 4 Vital Signs Time Taken: 12:36 Pulse (bpm): 78 Height (in): 69 Respiratory Rate (breaths/min): 18 Weight (lbs): 305.4 Blood Pressure (mmHg): 147/53 Body Mass Index (BMI): 45.1 Reference Range: 80 - 120 mg / dl Electronic Signature(s) Signed: 05/22/2017 4:57:33 PM By: Gretta Cool, BSN, RN, CWS, Kim RN, BSN Entered By: Gretta Cool, BSN, RN, CWS, Kim on 05/22/2017 12:36:40

## 2017-05-24 ENCOUNTER — Ambulatory Visit
Admission: RE | Admit: 2017-05-24 | Discharge: 2017-05-24 | Disposition: A | Discharge status: Home / Self Care | Payer: Medicare Other | Source: Ambulatory Visit | Attending: Unknown Physician Specialty | Admitting: Unknown Physician Specialty

## 2017-05-24 DIAGNOSIS — D500 Iron deficiency anemia secondary to blood loss (chronic): Secondary | ICD-10-CM | POA: Insufficient documentation

## 2017-05-24 DIAGNOSIS — K5520 Angiodysplasia of colon without hemorrhage: Secondary | ICD-10-CM | POA: Diagnosis present

## 2017-05-24 LAB — HEMOGLOBIN
HEMOGLOBIN: 7.5 g/dL — AB (ref 13.0–18.0)
Hemoglobin: 8.8 g/dL — ABNORMAL LOW (ref 13.0–18.0)

## 2017-05-24 LAB — PREPARE RBC (CROSSMATCH)

## 2017-05-24 MED ORDER — SODIUM CHLORIDE 0.9 % IV SOLN
Freq: Once | INTRAVENOUS | Status: AC
  Administered 2017-05-24: 08:00:00 via INTRAVENOUS

## 2017-05-24 NOTE — OR Nursing (Signed)
Lab tech in for HGB draw

## 2017-05-24 NOTE — OR Nursing (Signed)
Lab tech in for type and screen @ [phone]

## 2017-05-24 NOTE — OR Nursing (Signed)
Lab tech in for stat hgb draw (required between units per blood bank) @ 12:04 pm

## 2017-05-25 LAB — TYPE AND SCREEN
ABO/RH(D): A NEG
ANTIBODY SCREEN: NEGATIVE
UNIT DIVISION: 0
UNIT DIVISION: 0
UNIT DIVISION: 0

## 2017-05-25 LAB — BPAM RBC
BLOOD PRODUCT EXPIRATION DATE: 201809062359
BLOOD PRODUCT EXPIRATION DATE: 201809072359
Blood Product Expiration Date: 201809072359
ISSUE DATE / TIME: 201808240946
ISSUE DATE / TIME: 201808241227
UNIT TYPE AND RH: 600
UNIT TYPE AND RH: 600
Unit Type and Rh: 600

## 2017-05-27 ENCOUNTER — Ambulatory Visit: Payer: Medicare Other | Admitting: Occupational Therapy

## 2017-05-28 ENCOUNTER — Ambulatory Visit: Payer: Medicare Other | Admitting: Occupational Therapy

## 2017-05-29 ENCOUNTER — Encounter: Payer: Medicare Other | Admitting: Internal Medicine

## 2017-05-29 DIAGNOSIS — Z794 Long term (current) use of insulin: Secondary | ICD-10-CM

## 2017-05-29 DIAGNOSIS — E11622 Type 2 diabetes mellitus with other skin ulcer: Secondary | ICD-10-CM | POA: Diagnosis not present

## 2017-05-29 DIAGNOSIS — E1121 Type 2 diabetes mellitus with diabetic nephropathy: Secondary | ICD-10-CM | POA: Insufficient documentation

## 2017-05-30 ENCOUNTER — Encounter: Payer: Medicare Other | Admitting: Occupational Therapy

## 2017-05-31 NOTE — Progress Notes (Signed)
Corey West ([id number]) Visit Report for 05/29/2017 HPI Details Patient Name: Corey West, Corey West. Date of Service: 05/29/2017 1:00 PM Medical Record Number: [id number] Patient Account Number: [id number] Date of Birth/Sex: 1950/11/30 (66 y.o. Male) Treating RN: Primary Care Provider: Lynett West Other Clinician: Referring Provider: Lynett West Treating Provider/Extender: Corey West in Treatment: 5 History of Present Illness HPI Description: Pleasant 66 year old with a past medical history significant for morbid obesity, insulin- dependent type 2 diabetes (hemoglobin A1c 7.2 in January 2015), cirrhosis, and chronic kidney disease. He reports a left anterior calf ulcer since January 2015. He has had venous stasis ulcers in the past, which have healed spontaneously. No history of DVT. Does not regularly wear compression. Ambulating normally per his baseline. Recently completed a course of antibiotics. Treated with a Profore light and Coban 2 layer compression drainage, which both fell down and he cut off. Tolerated Coban lite compression bandage. Returns to clinic for followup. No complaints today. No pain at this time. No fever or chills. No open wounds at this time. He is having difficulty finding compression garments that fit him. 04/23/17; this is now 66 year old man who returns to our clinic for nonhealing wounds on the right lateral and right posterior calf. He tells me that they have been present for 6 months. In the setting of severe right greater than left lower extremity lymphedema likely secondary to chronic venous insufficiency. He has been followed in the lymphedema clinic at least since early June with compression wraps although these have not really helped and the patient is here for our review of this situation. The patient has had problems with edema in his legs for years. He has not worn compression. He is type II diabetic that sounds poorly controlled. He  also has obstructive sleep apnea and cirrhosis. He is occasional cigar smoker 04/30/17; patient is 66 year old man who has severe bilateral lower extremity lymphedema stage III since his early 65s he tells me. This is no doubt secondary to chronic venous insufficiency. He was going to the lymphedema clinic who forwarded him here due to skin breakdown in the right leg. He has not previously worn compression stockings. He has a large area of denuded epithelium on the posterior leg. He has a more open wound on the right lateral leg 05/07/17 on evaluation today patient appears to be doing about the same his wounds are showing signs of improvement and looking fairly well. With that being said he is not having any severe discomfort just some mild aching due to the swelling and really this is worse on the left than the right and the right side is where he actually has his wounds. He has been tolerating the [employer] wraps without complication and states this is actually much better than the wraps that he was getting at the lymphedema clinic most recently. He is happy with these wraps. He has no nausea, vomiting, diarrhea and no fevers or chills. We are still waiting to hear back in regard to the lymphedema pumps which I do think would be beneficial for him even with the compression wraps he still has a significant amount of edema noted of the bilateral lower extremities. 05/15/17 on evaluation today patient's right lower extremity edema and weeping especially the posterior portion appears to be doing better. He still is having some issue here but fortunately it's minimal compared [name], [name] ([id number]) to previous weeks. He continues to tolerate the compression fairly well. Overall he is pleased  with how things are progressing. No fevers, chills, nausea, or vomiting noted at this time. And patient is having only minimal discomfort. 05/22/17; the medial wound on the right leg actually looks considerably  better and is epithelialized. He still has a small punched out area on the right lateral leg. Edema control is marginal end of the [employer]. We are working on getting him compression pumps 05/29/17; the patient's lymphedema pumps are apparently arriving tonight. In the meantime he took his Unna boots off last Sunday because the edema had progressed to the point that 66 y.o. Male Since then he has put nothing on his right leg and more specifically nothing on the right medial and lateral wounds Electronic Signature(s) Signed: 05/29/2017 5:01:41 PM By: Corey Ham MD Entered By: Corey West on 05/29/2017 14:42:44 Corey West ([id number]) -------------------------------------------------------------------------------- Physical Exam Details Patient Name: [name], [name] Date of Service: 05/29/2017 1:00 PM Medical Record Number: [id number] Patient Account Number: [id number] Date of Birth/Sex: 06/21/1951 (66 y.o. Male) Treating RN: Primary Care Provider: Lynett West Other Clinician: Referring Provider: Lynett West Treating Provider/Extender: Corey West Weeks in Treatment: 5 Constitutional Patient is hypertensive.. Pulse regular and within target range for patient.Marland Kitchen Respirations regular, non-labored and within target range.. Temperature is normal and within the target range for the patient.Marland Kitchen appears in no distress. Eyes Conjunctivae clear. No discharge. Respiratory Respiratory effort is easy and symmetric bilaterally. Rate is normal at rest and on room air.. Bilateral breath sounds are clear and equal in all lobes with no wheezes, rales or rhonchi.. Cardiovascular Heart rhythm and rate regular, without murmur or gallop. No convincing evidence of CHF. Dorsalis pedis pulses palpable even through the severe edema. Severe bilateral lymphedema worse on the right. Chronic venous inflammation. Lymphatic None palpable in the popliteal or inguinal area. Psychiatric No  evidence of depression, anxiety, or agitation. Calm, cooperative, and communicative. Appropriate interactions and affect.. Notes Wound exam; right lateral lower extremity wounds on the medial and lateral aspect. Medially this looks like a laceration and laterally to small punched out open areas. There is weeping edema coming through these wounds Electronic Signature(s) Signed: 05/29/2017 5:01:41 PM By: Corey Ham MD Entered By: Corey West on 05/29/2017 14:44:29 Corey West ([id number]) -------------------------------------------------------------------------------- Physician Orders Details Patient Name: Corey West, Corey A. Date of Service: 05/29/2017 1:00 PM Medical Record Number: [id number] Patient Account Number: [id number] Date of Birth/Sex: 1951/04/11 (66 y.o. Male) Treating RN: Montey Hora Primary Care Provider: Lynett West Other Clinician: Referring Provider: Lynett West Treating Provider/Extender: Corey West in Treatment: 5 Verbal / Phone Orders: No Diagnosis Coding Wound Cleansing Wound #3 Right Lower Leg o May Shower, gently pat wound dry prior to applying new dressing. Wound #4 Right,Lateral Lower Leg o May Shower, gently pat wound dry prior to applying new dressing. Anesthetic Wound #4 Right,Lateral Lower Leg o Topical Lidocaine 4% cream applied to wound bed prior to debridement Primary Wound Dressing Wound #3 Right Lower Leg o Aquacel Ag Wound #4 Right,Lateral Lower Leg o Aquacel Ag Secondary Dressing Wound #3 Right Lower Leg o ABD pad o XtraSorb Wound #4 Right,Lateral Lower Leg o ABD pad o XtraSorb Dressing Change Frequency Wound #3 Right Lower Leg o Change dressing every week Wound #4 Right,Lateral Lower Leg o Change dressing every week Follow-up Appointments Wound #3 Right Lower Leg Corey West, Corey West ([id number]) o Return Appointment in 1 week. Wound #4 Right,Lateral Lower Leg o Return Appointment  in 1 week. Edema Control Wound #3  Right Lower Leg o 3 Layer Compression System - Right Lower Extremity o Compression Pump: Use compression pump on left lower extremity for 30 minutes, twice daily. Wound #4 Right,Lateral Lower Leg o 3 Layer Compression System - Right Lower Extremity o Compression Pump: Use compression pump on left lower extremity for 30 minutes, twice daily. Additional Orders / Instructions Wound #3 Right Lower Leg o Increase protein intake. o Other: - Get Blood Sugars below 150 Wound #4 Right,Lateral Lower Leg o Increase protein intake. o Other: - Get Blood Sugars below 150 Electronic Signature(s) Signed: 05/29/2017 5:01:41 PM By: Corey Ham MD Signed: 05/29/2017 5:39:07 PM By: Montey Hora Entered By: Montey Hora on 05/29/2017 14:07:22 Corey West ([id number]) -------------------------------------------------------------------------------- Problem List Details Patient Name: Corey West, Corey A. Date of Service: 05/29/2017 1:00 PM Medical Record Number: [id number] Patient Account Number: [id number] Date of Birth/Sex: 1951-04-25 (66 y.o. Male) Treating RN: Primary Care Provider: Lynett West Other Clinician: Referring Provider: Lynett West Treating Provider/Extender: Corey West in Treatment: 5 Active Problems ICD-10 Encounter Code Description Active Date Diagnosis I89.0 Lymphedema, not elsewhere classified 04/23/2017 Yes L97.211 Non-pressure chronic ulcer of right calf limited to 04/23/2017 Yes breakdown of skin I87.331 Chronic venous hypertension (idiopathic) with ulcer and 04/23/2017 Yes inflammation of right lower extremity E11.622 Type 2 diabetes mellitus with other skin ulcer 04/23/2017 Yes I89.9 Noninfective disorder of lymphatic vessels and lymph 04/23/2017 Yes nodes, unspecified Inactive Problems Resolved Problems Electronic Signature(s) Signed: 05/29/2017 5:01:41 PM By: Corey Ham MD Entered By: Corey West on 05/29/2017 14:41:13 Corey West ([id number]) -------------------------------------------------------------------------------- Progress Note Details Patient Name: Corey West. Date of Service: 05/29/2017 1:00 PM Medical Record Number: [id number] Patient Account Number: [id number] Date of Birth/Sex: [birth year] (66 y.o. Male) Treating RN: Primary Care Provider: Lynett West Other Clinician: Referring Provider: Lynett West Treating Provider/Extender: Corey West in Treatment: 5 Subjective History of Present Illness (HPI) Pleasant 66 year old with a past medical history significant for morbid obesity, insulin-dependent type 2 diabetes (hemoglobin A1c 7.2 in January 2015), cirrhosis, and chronic kidney disease. He reports a left anterior calf ulcer since January 2015. He has had venous stasis ulcers in the past, which have healed spontaneously. No history of DVT. Does not regularly wear compression. Ambulating normally per his baseline. Recently completed a course of antibiotics. Treated with a Profore light and Coban 2 layer compression drainage, which both fell down and he cut off. Tolerated Coban lite compression bandage. Returns to clinic for followup. No complaints today. No pain at this time. No fever or chills. No open wounds at this time. He is having difficulty finding compression garments that fit him. 04/23/17; this is now 66 year old man who returns to our clinic for nonhealing wounds on the right lateral and right posterior calf. He tells me that they have been present for 6 months. In the setting of severe right greater than left lower extremity lymphedema likely secondary to chronic venous insufficiency. He has been followed in the lymphedema clinic at least since early June with compression wraps although these have not really helped and the patient is here for our review of this situation. The patient has had problems with edema in his legs for  years. He has not worn compression. He is type II diabetic that sounds poorly controlled. He also has obstructive sleep apnea and cirrhosis. He is occasional cigar smoker 04/30/17; patient is a 66 year old man who has severe bilateral lower extremity lymphedema stage III since his early 80s he  tells me. This is no doubt secondary to chronic venous insufficiency. He was going to the lymphedema clinic who forwarded him here due to skin breakdown in the right leg. He has not previously worn compression stockings. He has a large area of denuded epithelium on the posterior leg. He has a more open wound on the right lateral leg 05/07/17 on evaluation today patient appears to be doing about the same his wounds are showing signs of improvement and looking fairly well. With that being said he is not having any severe discomfort just some mild aching due to the swelling and really this is worse on the left than the right and the right side is where he actually has his wounds. He has been tolerating the [employer] wraps without complication and states this is actually much better than the wraps that he was getting at the lymphedema clinic most recently. He is happy with these wraps. He has no nausea, vomiting, diarrhea and no fevers or chills. We are still waiting to hear back in regard to the lymphedema pumps which I do think would be beneficial for him even with the compression wraps he still has a significant amount of edema noted of the bilateral lower extremities. 05/15/17 on evaluation today patient's right lower extremity edema and weeping especially the posterior portion appears to be doing better. He still is having some issue here but fortunately it's minimal compared to previous weeks. He continues to tolerate the compression fairly well. Overall he is pleased with how things are progressing. No fevers, chills, nausea, or vomiting noted at this time. And patient is having only Corey West, Corey A.  ([id number]) minimal discomfort. 05/22/17; the medial wound on the right leg actually looks considerably better and is epithelialized. He still has a small punched out area on the right lateral leg. Edema control is marginal end of the [employer]. We are working on getting him compression pumps 05/29/17; the patient's lymphedema pumps are apparently arriving tonight. In the meantime he took his Unna boots off last Sunday because the edema had progressed to the point that 66 y.o. Male Since then he has put nothing on his right leg and more specifically nothing on the right medial and lateral wounds Objective Constitutional Patient is hypertensive.. Pulse regular and within target range for patient.Marland Kitchen Respirations regular, non-labored and within target range.. Temperature is normal and within the target range for the patient.Marland Kitchen appears in no distress. Vitals Time Taken: 1:26 PM, Height: 69 in, Weight: 305.4 lbs, BMI: 45.1, Temperature: 97.9 F, Pulse: 80 bpm, Respiratory Rate: 22 breaths/min, Blood Pressure: 149/58 mmHg. Eyes Conjunctivae clear. No discharge. Respiratory Respiratory effort is easy and symmetric bilaterally. Rate is normal at rest and on room air.. Bilateral breath sounds are clear and equal in all lobes with no wheezes, rales or rhonchi.. Cardiovascular Heart rhythm and rate regular, without murmur or gallop. No convincing evidence of CHF. Dorsalis pedis pulses palpable even through the severe edema. Severe bilateral lymphedema worse on the right. Chronic venous inflammation. Lymphatic None palpable in the popliteal or inguinal area. Psychiatric No evidence of depression, anxiety, or agitation. Calm, cooperative, and communicative. Appropriate interactions and affect.. General Notes: Wound exam; right lateral lower extremity wounds on the medial and lateral aspect. Medially this looks like a laceration and laterally to small punched out open areas. There is weeping  edema coming through these wounds Corey West, Corey West ([id number]) Integumentary (Hair, Skin) Wound #3 status is Open. Original cause of wound was Gradually  Appeared. The wound is located on the Right Lower Leg. The wound measures 7cm length x 6cm width x 0.1cm depth; 32.987cm^2 area and 3.299cm^3 volume. There is no tunneling or undermining noted. There is a large amount of serous drainage noted. The wound margin is flat and intact. There is medium (34-66%) pink granulation within the wound bed. There is a medium (34-66%) amount of necrotic tissue within the wound bed including Adherent Slough. The periwound skin appearance did not exhibit: Callus, Crepitus, Excoriation, Induration, Rash, Scarring, Dry/Scaly, Maceration, Atrophie Blanche, Cyanosis, Ecchymosis, Hemosiderin Staining, Mottled, Pallor, Rubor, Erythema. Periwound temperature was noted as No Abnormality. Wound #4 status is Open. Original cause of wound was Gradually Appeared. The wound is located on the Right,Lateral Lower Leg. The wound measures 0.8cm length x 0.8cm width x 0.2cm depth; 0.503cm^2 area and 0.101cm^3 volume. There is no tunneling or undermining noted. There is a large amount of serous drainage noted. The wound margin is flat and intact. There is no granulation within the wound bed. There is a large (67-100%) amount of necrotic tissue within the wound bed including Eschar and Adherent Slough. The periwound skin appearance did not exhibit: Callus, Crepitus, Excoriation, Induration, Rash, Scarring, Dry/Scaly, Maceration, Atrophie Blanche, Cyanosis, Ecchymosis, Hemosiderin Staining, Mottled, Pallor, Rubor, Erythema. Periwound temperature was noted as No Abnormality. 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 Wound #4 Right,Lateral Lower Leg: May Shower, gently pat wound dry prior to applying new dressing. Anesthetic: Wound #4 Right,Lateral Lower Leg: Topical Lidocaine 4% cream applied to wound bed prior to debridement Primary Wound Dressing: Corey West, Corey West ([id number]) Wound #3 Right Lower Leg: Aquacel Ag Wound #4 Right,Lateral Lower Leg: Aquacel Ag Secondary Dressing: Wound #3 Right Lower Leg: ABD pad XtraSorb Wound #4 Right,Lateral Lower Leg: ABD pad XtraSorb Dressing Change Frequency: Wound #3 Right Lower Leg: Change dressing every week Wound #4 Right,Lateral Lower Leg: Change dressing every week Follow-up Appointments: Wound #3 Right Lower Leg: Return Appointment in 1 week. Wound #4 Right,Lateral Lower Leg: Return Appointment in 1 week. Edema Control: Wound #3 Right Lower Leg: 3 Layer Compression System - Right Lower Extremity Compression Pump: Use compression pump on left lower extremity for 30 minutes, twice daily. Wound #4 Right,Lateral Lower Leg: 3 Layer Compression System - Right Lower Extremity Compression Pump: Use compression pump on left lower extremity for 30 minutes, twice daily. Additional Orders / Instructions: Wound #3 Right Lower Leg: Increase protein intake. Other: - Get Blood Sugars below 150 Wound #4 Right,Lateral Lower Leg: Increase protein intake. Other: - Get Blood Sugars below 150 o #1 silver alginate to the wounds on the right lateral and right medial leg. I put him in a 3 layer equivalent compression and he is to begin using his compression pumps over this area twice daily Corey West, Corey West ([id number]) #2 there is no evidence of infection but unless we can get the edema in his right leg  down there is no of healing these wounds which have leaking lymphedema fluid coming out of them. Electronic Signature(s) Signed: 05/29/2017 5:01:41 PM By: Corey Ham MD Entered By: Corey West  on 05/29/2017 [place] ([id number]) -------------------------------------------------------------------------------- [facility] Details Patient Name: Corey West, STICKELS. Date of Service: 05/29/2017 Medical Record Number: [id number] Patient Account Number: [id number] Date of Birth/Sex: [birth year] (67 y.o. Male) Treating RN: Primary Care Provider: Lynett West Other Clinician: Referring Provider: Lynett West Treating Provider/Extender: Corey West in Treatment: 5 Diagnosis Coding ICD-10 Codes Code Description I89.0 Lymphedema, not elsewhere classified L97.211 Non-pressure chronic ulcer of right calf limited to breakdown of skin Chronic venous hypertension (idiopathic) with ulcer and inflammation of right lower I87.331 extremity E11.622 Type 2 diabetes mellitus with other skin ulcer I89.9 Noninfective disorder of lymphatic vessels and lymph nodes, unspecified Facility Procedures CPT4: Description Modifier Quantity Code [id number] (Facility Use Only) [phone] - [place] RT 1 LEG Physician Procedures CPT4: Description Modifier Quantity Code [id number] - WC PHYS LEVEL 3 - EST PT 1 ICD-10 Description Diagnosis I87.331 Chronic venous hypertension (idiopathic) with ulcer and inflammation of right lower extremity I89.0 Lymphedema, not elsewhere  classified Electronic Signature(s) Signed: 05/29/2017 5:10:44 PM By: Montey Hora Previous Signature: 05/29/2017 5:01:41 PM Version By: Corey Ham MD Entered By: Montey Hora on 05/29/2017 17:10:44

## 2017-06-02 NOTE — Progress Notes (Signed)
KAI, CALICO ([id number]) Visit Report for 05/29/2017 Arrival Information Details Patient Name: Corey Corey, Corey Corey. Date of Service: 05/29/2017 1:00 PM Medical Record Number: [id number] Patient Account Number: [id number] Date of Birth/Sex: October 16, 1950 (66 y.o. Male) Treating RN: Montey Hora Primary Care Averey Koning: Lynett Fish Other Clinician: Referring Donelda Mailhot: Lynett Fish Treating Andrei Mccook/Extender: Tito Dine in Treatment: 5 Visit Information History Since Last Visit Added or deleted any medications: No Patient Arrived: Cane Any new allergies or adverse reactions: No Arrival Time: 13:25 Had Corey Corey or experienced change in No Accompanied By: self activities of daily living that may affect Transfer Assistance: None risk of falls: Patient Identification Verified: Yes Signs or symptoms of abuse/neglect since last No Secondary Verification Process Completed: Yes visito Hospitalized since last visit: No Has Dressing in Place as Prescribed: Yes Has Compression in Place as Prescribed: No Pain Present Now: No Electronic Signature(s) Signed: 05/29/2017 5:39:07 PM By: Montey Hora Entered By: Montey Hora on 05/29/2017 13:26:01 [name] ([id number]) -------------------------------------------------------------------------------- Compression Therapy Details Patient Name: Corey Corey. Date of Service: 05/29/2017 1:00 PM Medical Record Number: [id number] Patient Account Number: [id number] Date of Birth/Sex: 1951/05/02 (66 y.o. Male) Treating RN: Ahmed Prima Primary Care Rivky Clendenning: Lynett Fish Other Clinician: Referring Jaidah Lomax: Lynett Fish Treating Jaymz Traywick/Extender: Tito Dine in Treatment: 5 Compression Therapy Performed for Wound Wound #3 Right Lower Leg Assessment: Performed By: Levada Schilling, Debi, RN Compression Type: Three Layer Post Procedure Diagnosis Same as Pre-procedure Notes DURING ADMISSION OF PT THERE WAS  NO WAY OF GETTING ABI PER ADMITTING NURSE. Electronic Signature(s) Signed: 05/31/2017 4:51:13 PM By: Alric Quan Entered By: Alric Quan on 05/30/2017 10:01:08 [name] ([id number]) -------------------------------------------------------------------------------- Compression Therapy Details Patient Name: Corey Corey, Corey Corey. Date of Service: 05/29/2017 1:00 PM Medical Record Number: [id number] Patient Account Number: [id number] Date of Birth/Sex: Jul 08, 1951 (66 y.o. Male) Treating RN: Ahmed Prima Primary Care Genna Casimir: Lynett Fish Other Clinician: Referring Kemaya Dorner: Lynett Fish Treating Kristyana Notte/Extender: Tito Dine in Treatment: 5 Compression Therapy Performed for Wound Wound #4 Right,Lateral Lower Leg Assessment: Performed By: Levada Schilling, Debi, RN Compression Type: Three Layer Post Procedure Diagnosis Same as Pre-procedure Notes DURING ADMISSION OF PT THERE WAS NO WAY OF GETTING ABI PER ADMITTING NURSE. Electronic Signature(s) Signed: 05/31/2017 4:51:13 PM By: Alric Quan Entered By: Alric Quan on 05/30/2017 10:01:09 [name] ([id number]) -------------------------------------------------------------------------------- Encounter Discharge Information Details Patient Name: [name], TAILOR Corey. Date of Service: 05/29/2017 1:00 PM Medical Record Number: [id number] Patient Account Number: [id number] Date of Birth/Sex: 1951/07/13 (66 y.o. Male) Treating RN: Primary Care Clarnce Homan: Lynett Fish Other Clinician: Referring Trayveon Beckford: Lynett Fish Treating Azlaan Isidore/Extender: Tito Dine in Treatment: 5 Encounter Discharge Information Items Discharge Pain Level: 0 Discharge Condition: Stable Ambulatory Status: Cane Discharge Destination: Home Transportation: Private Auto Accompanied By: self Schedule Follow-up Appointment: Yes Medication Reconciliation completed No and provided to Patient/Care Clarke Peretz: Provided  on Clinical Summary of Care: 05/29/2017 Form Type Recipient Paper Patient [name] Signature(s) Signed: 05/31/2017 4:51:13 PM By: Alric Quan Entered By: Alric Quan on 05/29/2017 14:34:38 [name] ([id number]) -------------------------------------------------------------------------------- Lower Extremity Assessment Details Patient Name: Corey Corey. Date of Service: 05/29/2017 1:00 PM Medical Record Number: [id number] Patient Account Number: [id number] Date of Birth/Sex: 11-Apr-1951 (66 y.o. Male) Treating RN: Montey Hora Primary Care Tahira Olivarez: Lynett Fish Other Clinician: Referring Nelia Rogoff: Lynett Fish Treating Mack Alvidrez/Extender: Ricard Dillon Weeks in Treatment: 5 Edema Assessment Assessed: [Left: No] [Right: No] E[Left:  dema] [Right: :] Calf Left: Right: Point of Measurement: 32 cm From Medial Instep cm 54 cm Ankle Left: Right: Point of Measurement: 13 cm From Medial Instep cm 30.5 cm Vascular Assessment Pulses: Dorsalis Pedis Palpable: [Right:Yes] Posterior Tibial Extremity colors, hair growth, and conditions: Extremity Color: [Right:Hyperpigmented] Hair Growth on Extremity: [Right:No] Temperature of Extremity: [Right:Warm] Capillary Refill: [Right:< 3 seconds] Toe Nail Assessment Left: Right: Thick: Yes Discolored: Yes Deformed: No Improper Length and Hygiene: Yes Electronic Signature(s) Signed: 05/29/2017 5:39:07 PM By: Montey Hora Entered By: Montey Hora on 05/29/2017 13:33:21 [name] ([id number]) -------------------------------------------------------------------------------- Multi Wound Chart Details Patient Name: Corey Corey. Date of Service: 05/29/2017 1:00 PM Medical Record Number: [id number] Patient Account Number: [id number] Date of Birth/Sex: [birth year] (66 y.o. Male) Treating RN: Montey Hora Primary Care Janyce Ellinger: Lynett Fish Other Clinician: Referring Ilamae Geng: Lynett Fish Treating  Reade Trefz/Extender: Tito Dine in Treatment: 5 Vital Signs Height(in): 69 Pulse(bpm): 80 Weight(lbs): 305.4 Blood Pressure 149/58 (mmHg): Body Mass Index(BMI): 45 Temperature(F): 97.9 Respiratory Rate 22 (breaths/min): Photos: [3:No Photos] [4:No Photos] [N/Corey:N/Corey] Wound Location: [3:Right Lower Leg] [4:Right Lower Leg - Lateral] [N/Corey:N/Corey] Wounding Event: [3:Gradually Appeared] [4:Gradually Appeared] [N/Corey:N/Corey] Primary Etiology: [3:Lymphedema] [4:Lymphedema] [N/Corey:N/Corey] 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] [N/Corey:N/Corey] Comorbid History: [3:Cataracts, Anemia, Sleep Apnea, Hypertension, Cirrhosis , Type II Diabetes] [4:Cataracts, Anemia, Sleep Apnea, Hypertension, Cirrhosis , Type II Diabetes] [N/Corey:N/Corey] Date Acquired: [3:01/14/2017] [4:01/07/2017] [N/Corey:N/Corey] Weeks of Treatment: [3:5] [4:5] [N/Corey:N/Corey] Wound Status: [3:Open] [4:Open] [N/Corey:N/Corey] Measurements L x W x D 7x6x0.1 [4:0.8x0.8x0.2] [N/Corey:N/Corey] (cm) Area (cm) : [3:32.987] [4:0.503] [N/Corey:N/Corey] Volume (cm) : [3:3.299] [4:0.101] [N/Corey:N/Corey] % Reduction in Area: [3:14.30%] [4:46.60%] [N/Corey:N/Corey] % Reduction in Volume: 91.40% [4:46.30%] [N/Corey:N/Corey] Classification: [3:Partial Thickness] [4:Full Thickness Without Exposed Support Structures] [N/Corey:N/Corey] Exudate Amount: [3:Large] [4:Large] [N/Corey:N/Corey] Exudate Type: [3:Serous] [4:Serous] [N/Corey:N/Corey] Exudate Color: [3:amber] [4:amber] [N/Corey:N/Corey] Wound Margin: [3:Flat and Intact] [4:Flat and Intact] [N/Corey:N/Corey] Granulation Amount: [3:Medium (34-66%)] [4:None Present (0%)] [N/Corey:N/Corey] Granulation Quality: [3:Pink] [4:N/Corey] [N/Corey:N/Corey] Necrotic Amount: [3:Medium (34-66%)] [4:Large (67-100%)] [N/Corey:N/Corey] Necrotic Tissue: [3:Adherent Slough] [4:Eschar, Adherent Slough] [N/Corey:N/Corey] Exposed Structures: Fascia: No Fascia: No N/Corey Fat Layer (Subcutaneous Fat Layer (Subcutaneous Tissue) Exposed: No Tissue) Exposed: No Tendon:  No Tendon: No Muscle: No Muscle: No Joint: No Joint: No Bone: No Bone: No Epithelialization: Medium (34-66%) None N/Corey Periwound Skin Texture: Excoriation: No Excoriation: No N/Corey Induration: No Induration: No Callus: No Callus: No Crepitus: No Crepitus: No Rash: No Rash: No Scarring: No Scarring: No Periwound Skin Maceration: No Maceration: No N/Corey Moisture: Dry/Scaly: No Dry/Scaly: No Periwound Skin Color: Atrophie Blanche: No Atrophie Blanche: No N/Corey Cyanosis: No Cyanosis: No Ecchymosis: No Ecchymosis: No Erythema: No Erythema: No Hemosiderin Staining: No Hemosiderin Staining: No Mottled: No Mottled: No Pallor: No Pallor: No Rubor: No Rubor: No Temperature: No Abnormality No Abnormality N/Corey Tenderness on No No N/Corey Palpation: Wound Preparation: Ulcer Cleansing: Ulcer Cleansing: N/Corey Rinsed/Irrigated with Rinsed/Irrigated with Saline Saline Topical Anesthetic Topical Anesthetic Applied: None Applied: Other: lidocaine 4% Treatment Notes Wound #3 (Right Lower Leg) 1. Cleansed with: Clean wound with Normal Saline 2. Anesthetic Topical Lidocaine 4% cream to wound bed prior to debridement 4. Dressing Applied: Aquacel Ag 5. Secondary Dressing Applied ABD Pad 7. Secured with 3 Layer Compression System - Right Lower Extremity Notes xtrasorb RASMUS, PREUSSER ([id number]) Wound #4 (Right, Lateral Lower Leg) 1. Cleansed with: Clean wound with Normal Saline 2. Anesthetic Topical Lidocaine 4% cream to wound bed prior to debridement 4. Dressing Applied:  Aquacel Ag 5. Secondary Dressing Applied ABD Pad 7. Secured with 3 Layer Compression System - Right Lower Extremity Notes xtrasorb Electronic Signature(s) Signed: 05/29/2017 5:01:41 PM By: Linton Ham MD Entered By: Linton Ham on 05/29/2017 14:41:28 [name] ([id number]) -------------------------------------------------------------------------------- [place]  Details Patient Name: MAZIN, EMMA Corey. Date of Service: 05/29/2017 1:00 PM Medical Record Number: [id number] Patient Account Number: [id number] Date of Birth/Sex: 1951-02-11 (66 y.o. Male) Treating RN: Montey Hora Primary Care Athziri Freundlich: Lynett Fish Other Clinician: Referring Diavion Labrador: Lynett Fish Treating Broady Lafoy/Extender: Tito Dine in Treatment: 5 Active Inactive ` Orientation to the Wound Care Program Nursing Diagnoses: Knowledge deficit related to the wound healing center program Goals: Patient/caregiver will verbalize understanding of the [facility] Date Initiated: 04/23/2017 Target Resolution Date: 05/04/2017 Goal Status: Active Interventions: Provide education on orientation to the wound center Notes: ` Venous Leg Ulcer Nursing Diagnoses: Potential for venous Insuffiency (use before diagnosis confirmed) Goals: Patient will maintain optimal edema control Date Initiated: 04/23/2017 Target Resolution Date: 05/04/2017 Goal Status: Active Interventions: Compression as ordered Treatment Activities: Therapeutic compression applied : 04/23/2017 Notes: ` Wound/Skin Impairment TARON, MONDOR ([id number]) Nursing Diagnoses: Impaired tissue integrity Goals: Ulcer/skin breakdown will have Corey volume reduction of 80% by week 12 Date Initiated: 04/23/2017 Target Resolution Date: 05/04/2017 Goal Status: Active Interventions: Assess ulceration(s) every visit Notes: Electronic Signature(s) Signed: 05/29/2017 5:39:07 PM By: Montey Hora Entered By: Montey Hora on 05/29/2017 14:03:35 [name] ([id number]) -------------------------------------------------------------------------------- Pain Assessment Details Patient Name: Corey Corey. Date of Service: 05/29/2017 1:00 PM Medical Record Number: [id number] Patient Account Number: [id number] Date of Birth/Sex: 10-26-1950 (66 y.o. Male) Treating RN: Montey Hora Primary Care Jearl Soto:  Lynett Fish Other Clinician: Referring Selinda Korzeniewski: Lynett Fish Treating Cathalina Barcia/Extender: Tito Dine in Treatment: 5 Active Problems Location of Pain Severity and Description of Pain Patient Has Paino No Site Locations Pain Management and Medication Current Pain Management: Notes Topical or injectable lidocaine is offered to patient for acute pain when surgical debridement is performed. If needed, Patient is instructed to use over the counter pain medication for the following 24-48 hours after debridement. Wound care MDs do not prescribed pain medications. Patient has chronic pain or uncontrolled pain. Patient has been instructed to make an appointment with their Primary Care Physician for pain management. Electronic Signature(s) Signed: 05/29/2017 5:39:07 PM By: Montey Hora Entered By: Montey Hora on 05/29/2017 13:26:12 [name] ([id number]) -------------------------------------------------------------------------------- Patient/Caregiver Education Details Patient Name: RIELLY, CORLETT Corey. Date of Service: 05/29/2017 1:00 PM Medical Record Number: [id number] Patient Account Number: [id number] Date of Birth/Gender: 12/17/1950 (66 y.o. Male) Treating RN: Ahmed Prima Primary Care Physician: Lynett Fish Other Clinician: Referring Physician: Lynett Fish Treating Physician/Extender: Tito Dine in Treatment: 5 Education Assessment Education Provided To: Patient Education Topics Provided Wound/Skin Impairment: Handouts: Other: change dressing as ordered Methods: Demonstration, Explain/Verbal Responses: State content correctly Electronic Signature(s) Signed: 05/31/2017 4:51:13 PM By: Alric Quan Entered By: Alric Quan on 05/29/2017 14:34:51 [name] ([id number]) -------------------------------------------------------------------------------- Wound Assessment Details Patient Name: Corey Corey. Date of Service:  05/29/2017 1:00 PM Medical Record Number: [id number] Patient Account Number: [id number] Date of Birth/Sex: [birth year] (66 y.o. Male) Treating RN: Montey Hora Primary Care Areon Cocuzza: Lynett Fish Other Clinician: Referring Gionni Freese: Lynett Fish Treating Mariabelen Pressly/Extender: Ricard Dillon Weeks in Treatment: 5 Wound Status Wound Number: 3 Primary Lymphedema Etiology: Wound Location: Right Lower Leg Secondary Diabetic Wound/Ulcer of the Lower Wounding Event:  Gradually Appeared Etiology: Extremity Date Acquired: 01/14/2017 Wound Open Weeks Of Treatment: 5 Status: Clustered Wound: No Comorbid Cataracts, Anemia, Sleep Apnea, History: Hypertension, Cirrhosis , Type II Diabetes Photos Photo Uploaded By: Montey Hora on 05/30/2017 13:12:55 Wound Measurements Length: (cm) 7 Width: (cm) 6 Depth: (cm) 0.1 Area: (cm) 32.987 Volume: (cm) 3.299 % Reduction in Area: 14.3% % Reduction in Volume: 91.4% Epithelialization: Medium (34-66%) Tunneling: No Undermining: No Wound Description Classification: Partial Thickness Wound Margin: Flat and Intact Exudate Amount: Large Exudate Type: Serous Exudate Color: amber Foul Odor After Cleansing: No Slough/Fibrino Yes Wound Bed Granulation Amount: Medium (34-66%) Exposed Structure Granulation Quality: Pink Fascia Exposed: No MILIND, RAETHER ([id number]) Necrotic Amount: Medium (34-66%) Fat Layer (Subcutaneous Tissue) Exposed: No Necrotic Quality: Adherent Slough Tendon Exposed: No Muscle Exposed: No Joint Exposed: No Bone Exposed: No Periwound Skin Texture Texture Color No Abnormalities Noted: No No Abnormalities Noted: No Callus: No Atrophie Blanche: No Crepitus: No Cyanosis: No Excoriation: No Ecchymosis: No Induration: No Erythema: No Rash: No Hemosiderin Staining: No Scarring: No Mottled: No Pallor: No Moisture Rubor: No No Abnormalities Noted: No Dry / Scaly: No Temperature / Pain Maceration:  No Temperature: No Abnormality Wound Preparation Ulcer Cleansing: Rinsed/Irrigated with Saline Topical Anesthetic Applied: None Treatment Notes Wound #3 (Right Lower Leg) 1. Cleansed with: Clean wound with Normal Saline 2. Anesthetic Topical Lidocaine 4% cream to wound bed prior to debridement 4. Dressing Applied: Aquacel Ag 5. Secondary Dressing Applied ABD Pad 7. Secured with 3 Layer Compression System - Right Lower Extremity Notes xtrasorb Electronic Signature(s) Signed: 05/29/2017 5:39:07 PM By: Montey Hora Entered By: Montey Hora on 05/29/2017 13:29:40 [name] ([id number]) -------------------------------------------------------------------------------- Wound Assessment Details Patient Name: CALHOUN, REICHARDT Corey. Date of Service: 05/29/2017 1:00 PM Medical Record Number: [id number] Patient Account Number: [id number] Date of Birth/Sex: 01-29-1951 (66 y.o. Male) Treating RN: Montey Hora Primary Care Carling Liberman: Lynett Fish Other Clinician: Referring Gerald Honea: Lynett Fish Treating Macie Baum/Extender: Tito Dine in Treatment: 5 Wound Status Wound Number: 4 Primary Lymphedema Etiology: Wound Location: Right Lower Leg - Lateral Secondary Diabetic Wound/Ulcer of the Lower Wounding Event: Gradually Appeared Etiology: Extremity Date Acquired: 01/07/2017 Wound Open Weeks Of Treatment: 5 Status: Clustered Wound: No Comorbid Cataracts, Anemia, Sleep Apnea, History: Hypertension, Cirrhosis , Type II Diabetes Photos Photo Uploaded By: Montey Hora on 05/30/2017 13:12:56 Wound Measurements Length: (cm) 0.8 Width: (cm) 0.8 Depth: (cm) 0.2 Area: (cm) 0.503 Volume: (cm) 0.101 % Reduction in Area: 46.6% % Reduction in Volume: 46.3% Epithelialization: None Tunneling: No Undermining: No Wound Description Full Thickness Without Exposed Classification: Support Structures Wound Margin: Flat and Intact Exudate Large Amount: Exudate Type:  Serous Exudate Color: amber Foul Odor After Cleansing: No Slough/Fibrino Yes Wound Bed LOCHLANN, MASTRANGELO ([id number]) Granulation Amount: None Present (0%) Exposed Structure Necrotic Amount: Large (67-100%) Fascia Exposed: No Necrotic Quality: Eschar, Adherent Slough Fat Layer (Subcutaneous Tissue) Exposed: No Tendon Exposed: No Muscle Exposed: No Joint Exposed: No Bone Exposed: No Periwound Skin Texture Texture Color No Abnormalities Noted: No No Abnormalities Noted: No Callus: No Atrophie Blanche: No Crepitus: No Cyanosis: No Excoriation: No Ecchymosis: No Induration: No Erythema: No Rash: No Hemosiderin Staining: No Scarring: No Mottled: No Pallor: No Moisture Rubor: No No Abnormalities Noted: No Dry / Scaly: No Temperature / Pain Maceration: No Temperature: No Abnormality Wound Preparation Ulcer Cleansing: Rinsed/Irrigated with Saline Topical Anesthetic Applied: Other: lidocaine 4%, Treatment Notes Wound #4 (Right, Lateral Lower Leg) 1. Cleansed with: Clean wound with Normal Saline 2. Anesthetic  Topical Lidocaine 4% cream to wound bed prior to debridement 4. Dressing Applied: Aquacel Ag 5. Secondary Dressing Applied ABD Pad 7. Secured with 3 Layer Compression System - Right Lower Extremity Notes xtrasorb Electronic Signature(s) Signed: 05/29/2017 5:39:07 PM By: Montey Hora Entered By: Montey Hora on 05/29/2017 13:30:11 [name] ([id number]) -------------------------------------------------------------------------------- [place] Details Patient Name: Corey Corey. Date of Service: 05/29/2017 1:00 PM Medical Record Number: [id number] Patient Account Number: [id number] Date of Birth/Sex: October 26, 1950 (66 y.o. Male) Treating RN: Montey Hora Primary Care Jase Reep: Lynett Fish Other Clinician: Referring Tahira Olivarez: Lynett Fish Treating Donneisha Beane/Extender: Tito Dine in Treatment: 5 Vital Signs Time Taken: 13:26 Temperature  (F): 97.9 Height (in): 69 Pulse (bpm): 80 Weight (lbs): 305.4 Respiratory Rate (breaths/min): 22 Body Mass Index (BMI): 45.1 Blood Pressure (mmHg): 149/58 Reference Range: 80 - 120 mg / dl Electronic Signature(s) Signed: 05/29/2017 5:39:07 PM By: Montey Hora Entered By: Montey Hora on 05/29/2017 13:26:43

## 2017-06-04 ENCOUNTER — Encounter: Payer: Medicare Other | Admitting: Occupational Therapy

## 2017-06-05 ENCOUNTER — Encounter: Payer: Medicare Other | Attending: Internal Medicine | Admitting: Internal Medicine

## 2017-06-05 DIAGNOSIS — I10 Essential (primary) hypertension: Secondary | ICD-10-CM | POA: Insufficient documentation

## 2017-06-05 DIAGNOSIS — Z794 Long term (current) use of insulin: Secondary | ICD-10-CM | POA: Insufficient documentation

## 2017-06-05 DIAGNOSIS — I87331 Chronic venous hypertension (idiopathic) with ulcer and inflammation of right lower extremity: Secondary | ICD-10-CM | POA: Insufficient documentation

## 2017-06-05 DIAGNOSIS — D649 Anemia, unspecified: Secondary | ICD-10-CM | POA: Insufficient documentation

## 2017-06-05 DIAGNOSIS — G4733 Obstructive sleep apnea (adult) (pediatric): Secondary | ICD-10-CM | POA: Insufficient documentation

## 2017-06-05 DIAGNOSIS — K7460 Unspecified cirrhosis of liver: Secondary | ICD-10-CM | POA: Insufficient documentation

## 2017-06-05 DIAGNOSIS — E11622 Type 2 diabetes mellitus with other skin ulcer: Secondary | ICD-10-CM | POA: Diagnosis present

## 2017-06-05 DIAGNOSIS — I890 Lymphedema, not elsewhere classified: Secondary | ICD-10-CM | POA: Insufficient documentation

## 2017-06-05 DIAGNOSIS — Z6841 Body Mass Index (BMI) 40.0 and over, adult: Secondary | ICD-10-CM | POA: Insufficient documentation

## 2017-06-05 DIAGNOSIS — L97211 Non-pressure chronic ulcer of right calf limited to breakdown of skin: Secondary | ICD-10-CM | POA: Insufficient documentation

## 2017-06-05 DIAGNOSIS — I899 Noninfective disorder of lymphatic vessels and lymph nodes, unspecified: Secondary | ICD-10-CM | POA: Insufficient documentation

## 2017-06-05 DIAGNOSIS — F17290 Nicotine dependence, other tobacco product, uncomplicated: Secondary | ICD-10-CM | POA: Diagnosis not present

## 2017-06-06 ENCOUNTER — Encounter: Payer: Medicare Other | Admitting: Occupational Therapy

## 2017-06-07 NOTE — Progress Notes (Signed)
GAEL, LONDO ([id number]) Visit Report for 06/05/2017 Arrival Information Details Patient Name: EINAR, NOLASCO. Date of Service: 06/05/2017 2:00 PM Medical Record Number: [id number] Patient Account Number: [id number] Date of Birth/Sex: 1951/02/07 (66 y.o. Male) Treating RN: Cornell Barman Primary Care Donney Caraveo: Lynett Fish Other Clinician: Referring Airiel Oblinger: Lynett Fish Treating Kobie Whidby/Extender: Tito Dine in Treatment: 6 Visit Information History Since Last Visit Added or deleted any medications: No Patient Arrived: Ambulatory Any new allergies or adverse reactions: No Arrival Time: 14:06 Had a fall or experienced change in No Accompanied By: self activities of daily living that may affect Transfer Assistance: None risk of falls: Patient Identification Verified: Yes Signs or symptoms of abuse/neglect since last No Secondary Verification Process Yes visito Completed: Hospitalized since last visit: No Has Dressing in Place as Prescribed: Yes Pain Present Now: No Electronic Signature(s) Signed: 06/05/2017 5:52:49 PM By: Gretta Cool, BSN, RN, CWS, Kim RN, BSN Entered By: Gretta Cool, BSN, RN, CWS, Kim on 06/05/2017 14:07:02 [name] ([id number]) -------------------------------------------------------------------------------- Encounter Discharge Information Details Patient Name: ARIC, JOST A. Date of Service: 06/05/2017 2:00 PM Medical Record Number: [id number] Patient Account Number: [id number] Date of Birth/Sex: [birth year] (66 y.o. Male) Treating RN: Cornell Barman Primary Care Jariana Shumard: Lynett Fish Other Clinician: Referring Cyril Railey: Lynett Fish Treating Khale Nigh/Extender: Tito Dine in Treatment: 6 Encounter Discharge Information Items Discharge Pain Level: 0 Discharge Condition: Stable Ambulatory Status: Cane Discharge Destination: Home Private Transportation: Auto Accompanied By: self Schedule Follow-up Appointment: Yes Medication  Reconciliation completed and Yes provided to Patient/Care Risha Barretta: Clinical Summary of Care: Electronic Signature(s) Signed: 06/05/2017 5:52:49 PM By: Gretta Cool, BSN, RN, CWS, Kim RN, BSN Entered By: Gretta Cool, BSN, RN, CWS, Kim on 06/05/2017 14:47:38 [name] ([id number]) -------------------------------------------------------------------------------- Lower Extremity Assessment Details Patient Name: JOSEMIGUEL, GRIES A. Date of Service: 06/05/2017 2:00 PM Medical Record Number: [id number] Patient Account Number: [id number] Date of Birth/Sex: Jan 08, 1951 (66 y.o. Male) Treating RN: Cornell Barman Primary Care Trai Ells: Lynett Fish Other Clinician: Referring Ernestene Coover: Lynett Fish Treating Blaiden Werth/Extender: Tito Dine in Treatment: 6 Edema Assessment Assessed: [Left: No] [Right: No] E[Left: dema] [Right: :] Calf Left: Right: Point of Measurement: cm From Medial Instep cm 52.5 cm Ankle Left: Right: Point of Measurement: 13 cm From Medial Instep cm 30 cm Vascular Assessment Pulses: Dorsalis Pedis Palpable: [Right:Yes] Posterior Tibial Extremity colors, hair growth, and conditions: Extremity Color: [Right:Hyperpigmented] Hair Growth on Extremity: [Right:No] Temperature of Extremity: [Right:Warm] Capillary Refill: [Right:< 3 seconds] Dependent Rubor: [Right:No] Blanched when Elevated: [Right:No] 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 Date of Service: 06/05/2017 2:00 PM Medical Record Number: [id number] Patient Account Number:  [id number] Date of Birth/Sex: 1950-11-04 (66 y.o. 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 Extremity Comorbid History: Cataracts, Anemia, Sleep Cataracts, Anemia, Sleep N/A Apnea, Hypertension, Apnea, Hypertension, Cirrhosis , Type II Cirrhosis , Type II Diabetes Diabetes Date Acquired: 01/14/2017 01/07/2017 N/A Weeks of Treatment: 6 6 N/A Wound Status: Open Open N/A Measurements L x W x D 10x8x0.1 15x9x0.1 N/A (cm) Area (cm) : 71.062 106.029 N/A Volume (cm) : 6.283 10.603 N/A % Reduction in Area: -63.30% -11155.70% N/A % Reduction in Volume: 83.70% -5539.90% N/A Classification: Partial Thickness Full Thickness Without N/A Exposed Support Structures Exudate Amount: Large Large N/A Exudate Type: Serous Serous N/A Exudate Color: amber amber N/A EDGAR, REISZ ([id number]) Wound Margin: Flat and Intact Flat and Intact N/A Granulation Amount: Medium (34-66%) None Present (0%) N/A Granulation Quality: Pink N/A N/A Necrotic Amount: Medium (34-66%) Large (67-100%) N/A Necrotic Tissue: Adherent Slough Eschar, Adherent Slough N/A Exposed Structures: Fascia: No Fascia: No N/A Fat Layer (Subcutaneous Fat Layer (Subcutaneous Tissue) Exposed: No Tissue) Exposed: No Tendon: No Tendon: No Muscle: No Muscle: No Joint: No Joint: No Bone: No Bone: No Epithelialization: Medium  (34-66%) None N/A Periwound Skin Texture: Excoriation: No Excoriation: No N/A Induration: No Induration: No Callus: No Callus: No Crepitus: No Crepitus: No Rash: No Rash: No Scarring: No Scarring: No Periwound Skin Maceration: Yes Maceration: No N/A Moisture: Dry/Scaly: No Dry/Scaly: No Periwound Skin Color: Atrophie Blanche: No Atrophie Blanche: No N/A Cyanosis: No Cyanosis: No Ecchymosis: No Ecchymosis: No Erythema: No Erythema: No Hemosiderin Staining: No Hemosiderin Staining: No Mottled: No Mottled: No Pallor: No Pallor: No Rubor: No Rubor: No Temperature: No Abnormality No Abnormality N/A Tenderness on No No N/A Palpation: Wound Preparation: Ulcer Cleansing: Ulcer Cleansing: N/A Rinsed/Irrigated with Rinsed/Irrigated with Saline Saline Topical Anesthetic Topical Anesthetic Applied: None Applied: Other: lidocaine 4% Treatment Notes Wound #3 (Right Lower Leg) 1. Cleansed with: Cleanse wound with antibacterial soap and water 4. Dressing Applied: Aquacel Ag 5. Secondary Dressing Applied ABD Pad 7. Secured with DAY, DEERY ([id number]) 3 Layer Compression System - Right Lower Extremity Notes xsorb Wound #4 (Right, Lateral Lower Leg) 1. Cleansed with: Cleanse wound with antibacterial soap and water 4. Dressing Applied: Aquacel Ag 5. Secondary Dressing Applied ABD Pad 7. Secured with 3 Layer Compression System - Right Lower Extremity Notes xsorb Electronic Signature(s) Signed: 06/05/2017 5:15:21 PM By: Linton Ham MD Entered By: Linton Ham on 06/05/2017 15:32:44 [name] ([id number]) -------------------------------------------------------------------------------- [place] Details Patient Name: HARRINGTON, JOBE A. Date of Service: 06/05/2017 2:00 PM Medical Record Number: [id number] Patient Account Number: [id number] Date of Birth/Sex: Nov 01, 1950 (66 y.o. Male) Treating RN: Cornell Barman Primary Care Gregory Barrick: Lynett Fish Other Clinician: Referring Kensley Lares: Lynett Fish Treating Jacarie Pate/Extender: Tito Dine in Treatment: 6 Active Inactive ` Orientation to the Wound Care Program Nursing Diagnoses: Knowledge deficit related to the wound healing center program Goals: Patient/caregiver will verbalize understanding of the [place] Program Date Initiated: 04/23/2017 Target Resolution Date: 05/04/2017 Goal Status: Active Interventions: Provide education on orientation to the wound center Notes: ` Venous Leg Ulcer Nursing Diagnoses: Potential for venous Insuffiency (use before diagnosis confirmed) Goals: Patient will maintain optimal edema control Date Initiated: 04/23/2017 Target Resolution Date: 05/04/2017 Goal Status: Active Interventions: Compression as ordered Treatment Activities: Therapeutic compression applied : 04/23/2017 Notes: ` Wound/Skin Impairment WHITTAKER, LENIS ([id number]) Nursing Diagnoses: Impaired tissue integrity Goals: Ulcer/skin breakdown will have a volume reduction of 80% by week 12 Date Initiated: 04/23/2017 Target Resolution Date: 05/04/2017 Goal Status: Active Interventions: Assess ulceration(s) every visit Notes:  Electronic Signature(s) Signed: 06/05/2017 5:52:49 PM By: Gretta Cool, BSN, RN, CWS, Kim RN, BSN Entered By: Gretta Cool, BSN, RN, CWS, Kim on 06/05/2017 14:21:51 JONTEZ, REDFIELD ([id number]) -------------------------------------------------------------------------------- Pain Assessment Details Patient Name: LUC, SHAMMAS. Date of Service: 06/05/2017 2:00 PM Medical Record Number: [id number] Patient Account Number: [id number] Date of Birth/Sex: 09-Nov-1950 (66 y.o. Male) Treating RN: Cornell Barman Primary Care Ailana Cuadrado: Lynett Fish Other Clinician: Referring Hjalmer Iovino: Lynett Fish Treating Malayiah Mcbrayer/Extender: Tito Dine in Treatment: 6 Active Problems Location of Pain Severity and Description of Pain Patient Has  Paino No Site Locations With Dressing Change: No Pain Management and Medication Current Pain Management: Goals for Pain Management Topical or injectable lidocaine is offered to patient for acute pain when surgical debridement is performed. If needed, Patient is instructed to use over the counter pain medication for the following 24-48 hours after debridement. Wound care MDs do not prescribed pain medications. Patient has chronic pain or uncontrolled pain. Patient has been instructed to make an appointment with their Primary Care Physician for pain management. Electronic Signature(s) Signed: 06/05/2017 5:52:49 PM By: Gretta Cool, BSN, RN, CWS, Kim RN, BSN Entered By: Gretta Cool, BSN, RN, CWS, Kim on 06/05/2017 14:07:39 [name] ([id number]) -------------------------------------------------------------------------------- Patient/Caregiver Education Details Patient Name: MAISEN, KLINGLER A. Date of Service: 06/05/2017 2:00 PM Medical Record Number: [id number] Patient Account Number: [id number] Date of Birth/Gender: [birth year] (66 y.o. Male) Treating RN: Cornell Barman Primary Care Physician: Lynett Fish Other Clinician: Referring Physician: Lynett Fish Treating Physician/Extender: Tito Dine in Treatment: 6 Education Assessment Education Provided To: Patient Education Topics Provided Wound/Skin Impairment: Handouts: Caring for Your Ulcer Methods: Demonstration Responses: State content correctly Electronic Signature(s) Signed: 06/05/2017 5:52:49 PM By: Gretta Cool, BSN, RN, CWS, Kim RN, BSN Entered By: Gretta Cool, BSN, RN, CWS, Kim on 06/05/2017 14:47:53 [name] ([id number]) -------------------------------------------------------------------------------- Wound Assessment Details Patient Name: CRUZ, BONG A. Date of Service: 06/05/2017 2:00 PM Medical Record Number: [id number] Patient Account Number: [id number] Date of Birth/Sex: June 04, 1951 (66 y.o. Male) Treating RN: Cornell Barman Primary  Care Jireh Vinas: Lynett Fish Other Clinician: Referring Agapita Savarino: Lynett Fish Treating Aayansh Codispoti/Extender: Tito Dine in Treatment: 6 Wound Status Wound Number: 3 Primary Lymphedema Etiology: Wound Location: Right Lower Leg Secondary Diabetic Wound/Ulcer of the Lower Wounding Event: Gradually Appeared Etiology: Extremity Date Acquired: 01/14/2017 Wound Open Weeks Of Treatment: 6 Status: Clustered Wound: No Comorbid Cataracts, Anemia, Sleep Apnea, History: Hypertension, Cirrhosis , Type II Diabetes Photos Wound Measurements Length: (cm) 10 Width: (cm) 8 Depth: (cm) 0.1 Area: (cm) 62.832 Volume: (cm) 6.283 % Reduction in Area: -63.3% % Reduction in Volume: 83.7% Epithelialization: Medium (34-66%) Tunneling: No Wound Description Classification: Partial Thickness Wound Margin: Flat and Intact Exudate Amount: Large Exudate Type: Serous Exudate Color: amber Foul Odor After Cleansing: No Slough/Fibrino Yes Wound Bed Granulation Amount: Medium (34-66%) Exposed Structure Granulation Quality: Pink Fascia Exposed: No Necrotic Amount: Medium (34-66%) Fat Layer (Subcutaneous Tissue) Exposed: No Necrotic Quality: Adherent Slough Tendon Exposed: No Muscle Exposed: No TAHMID, STONEHOCKER ([id number]) Joint Exposed: No Bone Exposed: No Periwound Skin Texture Texture Color No Abnormalities Noted: No No Abnormalities Noted: No Callus: No Atrophie Blanche: No Crepitus: No Cyanosis: No Excoriation: No Ecchymosis: No Induration: No Erythema: No Rash: No Hemosiderin Staining: No Scarring: No Mottled: No Pallor: No Moisture Rubor: No No Abnormalities Noted: No Dry / Scaly: No Temperature / Pain Maceration: Yes Temperature: No Abnormality Wound Preparation Ulcer Cleansing: Rinsed/Irrigated with Saline Topical Anesthetic Applied: None Treatment Notes Wound #  3 (Right Lower Leg) 1. Cleansed with: Cleanse wound with antibacterial soap and water 4.  Dressing Applied: Aquacel Ag 5. Secondary Dressing Applied ABD Pad 7. Secured with 3 Layer Compression System - Right Lower Extremity Notes xsorb Electronic Signature(s) Signed: 06/05/2017 5:52:49 PM By: Gretta Cool, BSN, RN, CWS, Kim RN, BSN Entered By: Gretta Cool, BSN, RN, CWS, Kim on 06/05/2017 14:19:23 [name] ([id number]) -------------------------------------------------------------------------------- Wound Assessment Details Patient Name: CHONG, WOJDYLA A. Date of Service: 06/05/2017 2:00 PM Medical Record Number: [id number] Patient Account Number: [id number] Date of Birth/Sex: 13-Jun-1951 (66 y.o. Male) Treating RN: Cornell Barman Primary Care Taysean Wager: Lynett Fish Other Clinician: Referring Fredna Stricker: Lynett Fish Treating Ira Busbin/Extender: Tito Dine in Treatment: 6 Wound Status Wound Number: 4 Primary Lymphedema Etiology: Wound Location: Right Lower Leg - Lateral Secondary Diabetic Wound/Ulcer of the Lower Wounding Event: Gradually Appeared Etiology: Extremity Date Acquired: 01/07/2017 Wound Open Weeks Of Treatment: 6 Status: Clustered Wound: No Comorbid Cataracts, Anemia, Sleep Apnea, History: Hypertension, Cirrhosis , Type II Diabetes Photos Wound Measurements Length: (cm) 15 Width: (cm) 9 Depth: (cm) 0.1 Area: (cm) 106.029 Volume: (cm) 10.603 % Reduction in Area: -11155.7% % Reduction in Volume: -5539.9% Epithelialization: None Wound Description Full Thickness Without Exposed Classification: Support Structures Wound Margin: Flat and Intact Exudate Large Amount: Exudate Type: Serous Exudate Color: amber Foul Odor After Cleansing: No Slough/Fibrino Yes Wound Bed Granulation Amount: None Present (0%) Exposed Structure Necrotic Amount: Large (67-100%) Fascia Exposed: No Necrotic Quality: Eschar, Adherent Slough Fat Layer (Subcutaneous Tissue) Exposed: No WAYLAN, BUSTA ([id number]) Tendon Exposed: No Muscle Exposed: No Joint Exposed:  No Bone Exposed: No Periwound Skin Texture Texture Color No Abnormalities Noted: No No Abnormalities Noted: No Callus: No Atrophie Blanche: No Crepitus: No Cyanosis: No Excoriation: No Ecchymosis: No Induration: No Erythema: No Rash: No Hemosiderin Staining: No Scarring: No Mottled: No Pallor: No Moisture Rubor: No No Abnormalities Noted: No Dry / Scaly: No Temperature / Pain Maceration: No Temperature: No Abnormality Wound Preparation Ulcer Cleansing: Rinsed/Irrigated with Saline Topical Anesthetic Applied: Other: lidocaine 4%, Treatment Notes Wound #4 (Right, Lateral Lower Leg) 1. Cleansed with: Cleanse wound with antibacterial soap and water 4. Dressing Applied: Aquacel Ag 5. Secondary Dressing Applied ABD Pad 7. Secured with 3 Layer Compression System - Right Lower Extremity Notes xsorb Electronic Signature(s) Signed: 06/05/2017 5:52:49 PM By: Gretta Cool, BSN, RN, CWS, Kim RN, BSN Entered By: Gretta Cool, BSN, RN, CWS, Kim on 06/05/2017 14:19:58 [name] ([id number]) -------------------------------------------------------------------------------- [facility] Details Patient Name: [name] Date of Service: 06/05/2017 2:00 PM Medical Record Number: [id number] Patient Account Number: [id number] Date of Birth/Sex: [birth year] (66 y.o. Male) Treating RN: Cornell Barman Primary Care Croy Drumwright: Lynett Fish Other Clinician: Referring Balbina Depace: Lynett Fish Treating Alishah Schulte/Extender: Tito Dine in Treatment: 6 Vital Signs Time Taken: 14:07 Temperature (F): 98.4 Height (in): 69 Pulse (bpm): 82 Weight (lbs): 305.4 Respiratory Rate (breaths/min): 20 Body Mass Index (BMI): 45.1 Blood Pressure (mmHg): 146/48 Reference Range: 80 - 120 mg / dl Electronic Signature(s) Signed: 06/05/2017 5:52:49 PM By: Gretta Cool, BSN, RN, CWS, Kim RN, BSN Entered By: Gretta Cool, BSN, RN, CWS, Kim on 06/05/2017 14:08:10

## 2017-06-07 NOTE — Progress Notes (Signed)
HANZ, WINTERHALTER ([id number]) Visit Report for 06/05/2017 HPI Details Patient Name: Corey West, Corey West. Date of Service: 06/05/2017 2:00 PM Medical Record Number: [id number] Patient Account Number: [id number] Date of Birth/Sex: 10/20/1950 (66 y.o. Male) Treating RN: Cornell Barman Primary Care Provider: Lynett Fish Other Clinician: Referring Provider: Lynett Fish Treating Provider/Extender: Tito Dine in Treatment: 6 History of Present Illness HPI Description: Pleasant 66 year old with a past medical history significant for morbid obesity, insulin- dependent type 2 diabetes (hemoglobin A1c 7.2 in January 2015), cirrhosis, and chronic kidney disease. He reports a left anterior calf ulcer since January 2015. He has had venous stasis ulcers in the past, which have healed spontaneously. No history of DVT. Does not regularly wear compression. Ambulating normally per his baseline. Recently completed a course of antibiotics. Treated with a Profore light and Coban 2 layer compression drainage, which both fell down and he cut off. Tolerated Coban lite compression bandage. Returns to clinic for followup. No complaints today. No pain at this time. No fever or chills. No open wounds at this time. He is having difficulty finding compression garments that fit him. 04/23/17; this is now 66 year old man who returns to our clinic for nonhealing wounds on the right lateral and right posterior calf. He tells me that they have been present for 6 months. In the setting of severe right greater than left lower extremity lymphedema likely secondary to chronic venous insufficiency. He has been followed in the lymphedema clinic at least since early June with compression wraps although these have not really helped and the patient is here for our review of this situation. The patient has had problems with edema in his legs for years. He has not worn compression. He is type II diabetic that sounds  poorly controlled. He also has obstructive sleep apnea and cirrhosis. He is occasional cigar smoker 04/30/17; patient is a 66 year old man who has severe bilateral lower extremity lymphedema stage III since his early 55s he tells me. This is no doubt secondary to chronic venous insufficiency. He was going to the lymphedema clinic who forwarded him here due to skin breakdown in the right leg. He has not previously worn compression stockings. He has a large area of denuded epithelium on the posterior leg. He has a more open wound on the right lateral leg 05/07/17 on evaluation today patient appears to be doing about the same his wounds are showing signs of improvement and looking fairly well. With that being said he is not having any severe discomfort just some mild aching due to the swelling and really this is worse on the left than the right and the right side is where he actually has his wounds. He has been tolerating the [employer] wraps without complication and states this is actually much better than the wraps that he was getting at the lymphedema clinic most recently. He is happy with these wraps. He has no nausea, vomiting, diarrhea and no fevers or chills. We are still waiting to hear back in regard to the lymphedema pumps which I do think would be beneficial for him even with the compression wraps he still has a significant amount of edema noted of the bilateral lower extremities. 05/15/17 on evaluation today patient's right lower extremity edema and weeping especially the posterior portion appears to be doing better. He still is having some issue here but fortunately it's minimal compared Corey West, Corey A. ([id number]) to previous weeks. He continues to tolerate the compression fairly well. Overall he  is pleased with how things are progressing. No fevers, chills, nausea, or vomiting noted at this time. And patient is having only minimal discomfort. 05/22/17; the medial wound on the right leg  actually looks considerably better and is epithelialized. He still has a small punched out area on the right lateral leg. Edema control is marginal end of the [employer]. We are working on getting him compression pumps 05/29/17; the patient's lymphedema pumps are apparently arriving tonight. In the meantime he took his Unna boots off last Sunday because the edema had progressed to the point that a word to titrate. Since then he has put nothing on his right leg and more specifically nothing on the right medial and lateral wounds 06/05/17; the patient has his lymphedema pumps and he is using them once a day on the right leg. There is a sizable reduction in the circumference of his leg although he arrived in clinic today somewhat short of breath. Electronic Signature(s) Signed: 06/05/2017 5:15:21 PM By: Linton Ham MD Entered By: Linton Ham on 06/05/2017 15:33:28 Corey West ([id number]) -------------------------------------------------------------------------------- Physical Exam Details Patient Name: Corey West, Corey A. Date of Service: 06/05/2017 2:00 PM Medical Record Number: [id number] Patient Account Number: [id number] Date of Birth/Sex: [birth year] (66 y.o. Male) Treating RN: Cornell Barman Primary Care Provider: Lynett Fish Other Clinician: Referring Provider: Lynett Fish Treating Provider/Extender: Tito Dine in Treatment: 6 Constitutional Patient is hypertensive.. Pulse regular and within target range for patient.Marland Kitchen Respirations regular, non-labored and within target range.. Temperature is normal and within the target range for the patient.Marland Kitchen appears in no distress. Eyes Conjunctivae clear. No discharge. Respiratory Respiratory rate elveaated. Cardiovascular Heart rhythm and rate regular, without murmur or gallop. JVP is not elevated. Pedal pulses palpable and strong bilaterally.. chronic lymphedema with venous insufficiency and inflammation. The edema is  pitting especially up into his posterior thighs. Gastrointestinal (GI) Obese but no ascites. Integumentary (Hair, Skin) No systemic rash. Significant chronic venous inflammation in the right lower leg. Psychiatric No evidence of depression, anxiety, or agitation. Calm, cooperative, and communicative. Appropriate interactions and affect.. Notes Wound exam; right lower extremity wounds are improved still superficial and macerated but basically look better than last week. The overall feeling is that as his edema lessens in his legs these should epithelialize Electronic Signature(s) Signed: 06/05/2017 5:15:21 PM By: Linton Ham MD Entered By: Linton Ham on 06/05/2017 15:39:29 Corey West ([id number]) -------------------------------------------------------------------------------- Physician Orders Details Patient Name: [name] Date of Service: 06/05/2017 2:00 PM Medical Record Number: [id number] Patient Account Number: [id number] Date of Birth/Sex: 19-Oct-1950 (66 y.o. Male) Treating RN: Cornell Barman Primary Care Provider: Lynett Fish Other Clinician: Referring Provider: Lynett Fish Treating Provider/Extender: Tito Dine in Treatment: 6 Verbal / Phone Orders: No Diagnosis Coding Wound Cleansing Wound #3 Right Lower Leg o May Shower, gently pat wound dry prior to applying new dressing. Wound #4 Right,Lateral Lower Leg o May Shower, gently pat wound dry prior to applying new dressing. Anesthetic Wound #4 Right,Lateral Lower Leg o Topical Lidocaine 4% cream applied to wound bed prior to debridement Primary Wound Dressing Wound #3 Right Lower Leg o Aquacel Ag Wound #4 Right,Lateral Lower Leg o Aquacel Ag Secondary Dressing Wound #3 Right Lower Leg o ABD pad o XtraSorb Wound #4 Right,Lateral Lower Leg o ABD pad o XtraSorb Dressing Change Frequency Wound #3 Right Lower Leg o Change dressing every week Wound #4 Right,Lateral  Lower Leg o Change dressing every week Follow-up Appointments Wound #  3 Right Lower Leg Corey West, Corey West ([id number]) o Return Appointment in 1 week. Wound #4 Right,Lateral Lower Leg o Return Appointment in 1 week. Edema Control Wound #3 Right Lower Leg o 3 Layer Compression System - Right Lower Extremity o Compression Pump: Use compression pump on left lower extremity for 30 minutes, twice daily. Wound #4 Right,Lateral Lower Leg o 3 Layer Compression System - Right Lower Extremity o Compression Pump: Use compression pump on left lower extremity for 30 minutes, twice daily. Additional Orders / Instructions Wound #3 Right Lower Leg o Increase protein intake. o Other: - Get Blood Sugars below 150 Wound #4 Right,Lateral Lower Leg o Increase protein intake. o Other: - Get Blood Sugars below 150 Electronic Signature(s) Signed: 06/05/2017 5:15:21 PM By: Linton Ham MD Signed: 06/05/2017 5:52:49 PM By: Gretta Cool, BSN, RN, CWS, Kim RN, BSN Entered By: Gretta Cool, BSN, RN, CWS, Kim on 06/05/2017 14:45:48 Corey West ([id number]) -------------------------------------------------------------------------------- Problem List Details Patient Name: Corey West, Corey West. Date of Service: 06/05/2017 2:00 PM Medical Record Number: [id number] Patient Account Number: [id number] Date of Birth/Sex: September 08, 1951 (66 y.o. Male) Treating RN: Cornell Barman Primary Care Provider: Lynett Fish Other Clinician: Referring Provider: Lynett Fish Treating Provider/Extender: Tito Dine in Treatment: 6 Active Problems ICD-10 Encounter Code Description Active Date Diagnosis I89.0 Lymphedema, not elsewhere classified 04/23/2017 Yes L97.211 Non-pressure chronic ulcer of right calf limited to 04/23/2017 Yes breakdown of skin I87.331 Chronic venous hypertension (idiopathic) with ulcer and 04/23/2017 Yes inflammation of right lower extremity E11.622 Type 2 diabetes mellitus with other skin  ulcer 04/23/2017 Yes I89.9 Noninfective disorder of lymphatic vessels and lymph 04/23/2017 Yes nodes, unspecified Inactive Problems Resolved Problems Electronic Signature(s) Signed: 06/05/2017 5:15:21 PM By: Linton Ham MD Entered By: Linton Ham on 06/05/2017 15:32:22 Corey West ([id number]) -------------------------------------------------------------------------------- Progress Note Details Patient Name: Corey West Date of Service: 06/05/2017 2:00 PM Medical Record Number: [id number] Patient Account Number: [id number] Date of Birth/Sex: May 07, 1951 (66 y.o. Male) Treating RN: Cornell Barman Primary Care Provider: Lynett Fish Other Clinician: Referring Provider: Lynett Fish Treating Provider/Extender: Tito Dine in Treatment: 6 Subjective History of Present Illness (HPI) Pleasant 66 year old with a past medical history significant for morbid obesity, insulin-dependent type 2 diabetes (hemoglobin A1c 7.2 in January 2015), cirrhosis, and chronic kidney disease. He reports a left anterior calf ulcer since January 2015. He has had venous stasis ulcers in the past, which have healed spontaneously. No history of DVT. Does not regularly wear compression. Ambulating normally per his baseline. Recently completed a course of antibiotics. Treated with a Profore light and Coban 2 layer compression drainage, which both fell down and he cut off. Tolerated Coban lite compression bandage. Returns to clinic for followup. No complaints today. No pain at this time. No fever or chills. No open wounds at this time. He is having difficulty finding compression garments that fit him. 04/23/17; this is now 66 year old man who returns to our clinic for nonhealing wounds on the right lateral and right posterior calf. He tells me that they have been present for 6 months. In the setting of severe right greater than left lower extremity lymphedema likely secondary to chronic venous  insufficiency. He has been followed in the lymphedema clinic at least since early June with compression wraps although these have not really helped and the patient is here for our review of this situation. The patient has had problems with edema in his legs for years. He has not worn compression. He  is type II diabetic that sounds poorly controlled. He also has obstructive sleep apnea and cirrhosis. He is occasional cigar smoker 04/30/17; patient is a 66 year old man who has severe bilateral lower extremity lymphedema stage III since his early 78s he tells me. This is no doubt secondary to chronic venous insufficiency. He was going to the lymphedema clinic who forwarded him here due to skin breakdown in the right leg. He has not previously worn compression stockings. He has a large area of denuded epithelium on the posterior leg. He has a more open wound on the right lateral leg 05/07/17 on evaluation today patient appears to be doing about the same his wounds are showing signs of improvement and looking fairly well. With that being said he is not having any severe discomfort just some mild aching due to the swelling and really this is worse on the left than the right and the right side is where he actually has his wounds. He has been tolerating the [employer] wraps without complication and states this is actually much better than the wraps that he was getting at the lymphedema clinic most recently. He is happy with these wraps. He has no nausea, vomiting, diarrhea and no fevers or chills. We are still waiting to hear back in regard to the lymphedema pumps which I do think would be beneficial for him even with the compression wraps he still has a significant amount of edema noted of the bilateral lower extremities. 05/15/17 on evaluation today patient's right lower extremity edema and weeping especially the posterior portion appears to be doing better. He still is having some issue here but fortunately  it's minimal compared to previous weeks. He continues to tolerate the compression fairly well. Overall he is pleased with how things are progressing. No fevers, chills, nausea, or vomiting noted at this time. And patient is having only Corey West, Corey A. ([id number]) minimal discomfort. 05/22/17; the medial wound on the right leg actually looks considerably better and is epithelialized. He still has a small punched out area on the right lateral leg. Edema control is marginal end of the [employer]. We are working on getting him compression pumps 05/29/17; the patient's lymphedema pumps are apparently arriving tonight. In the meantime he took his Unna boots off last Sunday because the edema had progressed to the point that a word to titrate. Since then he has put nothing on his right leg and more specifically nothing on the right medial and lateral wounds 06/05/17; the patient has his lymphedema pumps and he is using them once a day on the right leg. There is a sizable reduction in the circumference of his leg although he arrived in clinic today somewhat short of breath. Objective Constitutional Patient is hypertensive.. Pulse regular and within target range for patient.Marland Kitchen Respirations regular, non-labored and within target range.. Temperature is normal and within the target range for the patient.Marland Kitchen appears in no distress. Vitals Time Taken: 2:07 PM, Height: 69 in, Weight: 305.4 lbs, BMI: 45.1, Temperature: 98.4 F, Pulse: 82 bpm, Respiratory Rate: 20 breaths/min, Blood Pressure: 146/48 mmHg. Eyes Conjunctivae clear. No discharge. Respiratory Respiratory rate elveaated. Cardiovascular Heart rhythm and rate regular, without murmur or gallop. JVP is not elevated. Pedal pulses palpable and strong bilaterally.. chronic lymphedema with venous insufficiency and inflammation. The edema is pitting especially up into his posterior thighs. Gastrointestinal (GI) Obese but no ascites. Psychiatric No evidence  of depression, anxiety, or agitation. Calm, cooperative, and communicative. Appropriate interactions and affect.. General Notes: Wound  exam; right lower extremity wounds are improved still superficial and macerated but Corey West, Corey A. ([id number]) basically look better than last week. The overall feeling is that as his edema lessens in his legs these should epithelialize Integumentary (Hair, Skin) No systemic rash. Significant chronic venous inflammation in the right lower leg. Wound #3 status is Open. Original cause of wound was Gradually Appeared. The wound is located on the Right Lower Leg. The wound measures 10cm length x 8cm width x 0.1cm depth; 62.832cm^2 area and 6.283cm^3 volume. There is no tunneling noted. There is a large amount of serous drainage noted. The wound margin is flat and intact. There is medium (34-66%) pink granulation within the wound bed. There is a medium (34-66%) amount of necrotic tissue within the wound bed including Adherent Slough. The periwound skin appearance exhibited: Maceration. The periwound skin appearance did not exhibit: Callus, Crepitus, Excoriation, Induration, Rash, Scarring, Dry/Scaly, Atrophie Blanche, Cyanosis, Ecchymosis, Hemosiderin Staining, Mottled, Pallor, Rubor, Erythema. Periwound temperature was noted as No Abnormality. Wound #4 status is Open. Original cause of wound was Gradually Appeared. The wound is located on the Right,Lateral Lower Leg. The wound measures 15cm length x 9cm width x 0.1cm depth; 106.029cm^2 area and 10.603cm^3 volume. There is a large amount of serous drainage noted. The wound margin is flat and intact. There is no granulation within the wound bed. There is a large (67-100%) amount of necrotic tissue within the wound bed including Eschar and Adherent Slough. The periwound skin appearance did not exhibit: Callus, Crepitus, Excoriation, Induration, Rash, Scarring, Dry/Scaly, Maceration, Atrophie Blanche, Cyanosis,  Ecchymosis, Hemosiderin Staining, Mottled, Pallor, Rubor, Erythema. Periwound temperature was noted as No Abnormality. 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 Wound #4 Right,Lateral Lower Leg: Corey West, Corey West ([id number]) May Shower, gently pat wound dry prior to applying new dressing. Anesthetic: Wound #4 Right,Lateral Lower Leg: Topical Lidocaine 4% cream applied to wound bed prior to debridement Primary Wound Dressing: Wound #3 Right Lower Leg: Aquacel Ag Wound #4 Right,Lateral Lower Leg: Aquacel Ag Secondary Dressing: Wound #3 Right Lower Leg: ABD pad XtraSorb Wound #4 Right,Lateral Lower Leg: ABD pad XtraSorb Dressing Change Frequency: Wound #3 Right Lower Leg: Change dressing every week Wound #4 Right,Lateral Lower Leg: Change dressing every week Follow-up Appointments: Wound #3 Right Lower Leg: Return Appointment in 1 week. Wound #4 Right,Lateral Lower Leg: Return Appointment in 1 week. Edema Control: Wound #3 Right Lower Leg: 3 Layer Compression System - Right Lower Extremity Compression Pump: Use compression pump on left lower extremity for 30 minutes, twice daily. Wound #4 Right,Lateral Lower Leg: 3 Layer Compression System - Right Lower Extremity Compression Pump: Use compression pump on left lower extremity for 30 minutes, twice daily. Additional Orders / Instructions: Wound #3 Right Lower Leg: Increase protein intake. Other: - Get Blood Sugars below 150 Wound #4 Right,Lateral Lower  Leg: Increase protein intake. Other: - Get Blood Sugars below 150 Corey West, Corey West ([id number]) o #1 patient well known to this clinic. She had a fall and a significant skin tear/laceration 6 weeks ago. The areas on the right elbow and right  hip of apparently healed however she's been left with a solitary wound just below the right knee laterally at the level of the tibial plateau. Debridement as noted. Surface of the wound looks healthy. Previously she did well with RTD. Mild hyperventilation suggests Hydrofera Blue would be helpful. o Electronic Signature(s) Signed: 06/05/2017 5:15:21 PM By: Linton Ham MD Entered By: Linton Ham on 06/05/2017 15:41:42 Corey West ([id number]) -------------------------------------------------------------------------------- [place] Details Patient Name: Corey West Date of Service: 06/05/2017 Medical Record Number: [id number] Patient Account Number: [id number] Date of Birth/Sex: 05-17-1951 (66 y.o. Male) Treating RN: Cornell Barman Primary Care Provider: Lynett Fish Other Clinician: Referring Provider: Lynett Fish Treating Provider/Extender: Tito Dine in Treatment: 6 Diagnosis Coding ICD-10 Codes Code Description I89.0 Lymphedema, not elsewhere classified L97.211 Non-pressure chronic ulcer of right calf limited to breakdown of skin Chronic venous hypertension (idiopathic) with ulcer and inflammation of right lower I87.331 extremity E11.622 Type 2 diabetes mellitus with other skin ulcer I89.9 Noninfective disorder of lymphatic vessels and lymph nodes, unspecified Facility Procedures CPT4: Description Modifier Quantity Code [id number] (Facility Use Only) [phone] - APPLY [place] RT 1 LEG Physician Procedures CPT4 Code Description: [id number] [place] - WC PHYS LEVEL 3 - EST PT ICD-10 Description Diagnosis L97.211 Non-pressure chronic ulcer of right calf limited to I89.0 Lymphedema, not elsewhere classified Modifier:  breakdown of s Quantity: 1 kin Electronic Signature(s) Signed: 06/05/2017 5:15:21 PM By: Linton Ham MD Entered By: Linton Ham on 06/05/2017 15:42:11

## 2017-06-10 ENCOUNTER — Encounter: Payer: Medicare Other | Admitting: Occupational Therapy

## 2017-06-11 ENCOUNTER — Encounter: Payer: Medicare Other | Admitting: Occupational Therapy

## 2017-06-12 ENCOUNTER — Inpatient Hospital Stay
Admission: EM | Admit: 2017-06-12 | Discharge: 2017-06-13 | DRG: 872 | Disposition: A | Discharge status: Home / Self Care | Payer: Medicare Other | Attending: Specialist | Admitting: Specialist

## 2017-06-12 ENCOUNTER — Emergency Department: Payer: Medicare Other

## 2017-06-12 ENCOUNTER — Encounter: Payer: Self-pay | Admitting: Emergency Medicine

## 2017-06-12 ENCOUNTER — Ambulatory Visit: Payer: Medicare Other | Admitting: Internal Medicine

## 2017-06-12 DIAGNOSIS — F17210 Nicotine dependence, cigarettes, uncomplicated: Secondary | ICD-10-CM | POA: Diagnosis present

## 2017-06-12 DIAGNOSIS — F329 Major depressive disorder, single episode, unspecified: Secondary | ICD-10-CM | POA: Diagnosis not present

## 2017-06-12 DIAGNOSIS — Z6841 Body Mass Index (BMI) 40.0 and over, adult: Secondary | ICD-10-CM

## 2017-06-12 DIAGNOSIS — D638 Anemia in other chronic diseases classified elsewhere: Secondary | ICD-10-CM | POA: Diagnosis present

## 2017-06-12 DIAGNOSIS — E1122 Type 2 diabetes mellitus with diabetic chronic kidney disease: Secondary | ICD-10-CM | POA: Diagnosis present

## 2017-06-12 DIAGNOSIS — F419 Anxiety disorder, unspecified: Secondary | ICD-10-CM | POA: Diagnosis present

## 2017-06-12 DIAGNOSIS — Z79899 Other long term (current) drug therapy: Secondary | ICD-10-CM

## 2017-06-12 DIAGNOSIS — I890 Lymphedema, not elsewhere classified: Secondary | ICD-10-CM | POA: Diagnosis not present

## 2017-06-12 DIAGNOSIS — L03115 Cellulitis of right lower limb: Secondary | ICD-10-CM | POA: Diagnosis not present

## 2017-06-12 DIAGNOSIS — A419 Sepsis, unspecified organism: Principal | ICD-10-CM | POA: Diagnosis present

## 2017-06-12 DIAGNOSIS — K7460 Unspecified cirrhosis of liver: Secondary | ICD-10-CM | POA: Diagnosis not present

## 2017-06-12 DIAGNOSIS — E119 Type 2 diabetes mellitus without complications: Secondary | ICD-10-CM

## 2017-06-12 DIAGNOSIS — I8510 Secondary esophageal varices without bleeding: Secondary | ICD-10-CM | POA: Diagnosis present

## 2017-06-12 DIAGNOSIS — G4730 Sleep apnea, unspecified: Secondary | ICD-10-CM | POA: Diagnosis present

## 2017-06-12 DIAGNOSIS — K219 Gastro-esophageal reflux disease without esophagitis: Secondary | ICD-10-CM | POA: Diagnosis not present

## 2017-06-12 DIAGNOSIS — N183 Chronic kidney disease, stage 3 (moderate): Secondary | ICD-10-CM | POA: Diagnosis present

## 2017-06-12 DIAGNOSIS — E872 Acidosis, unspecified: Secondary | ICD-10-CM

## 2017-06-12 DIAGNOSIS — Z794 Long term (current) use of insulin: Secondary | ICD-10-CM

## 2017-06-12 DIAGNOSIS — I878 Other specified disorders of veins: Secondary | ICD-10-CM | POA: Diagnosis present

## 2017-06-12 DIAGNOSIS — M1990 Unspecified osteoarthritis, unspecified site: Secondary | ICD-10-CM | POA: Diagnosis present

## 2017-06-12 DIAGNOSIS — R600 Localized edema: Secondary | ICD-10-CM | POA: Diagnosis present

## 2017-06-12 DIAGNOSIS — L03116 Cellulitis of left lower limb: Secondary | ICD-10-CM | POA: Diagnosis not present

## 2017-06-12 DIAGNOSIS — L0390 Cellulitis, unspecified: Secondary | ICD-10-CM

## 2017-06-12 DIAGNOSIS — I129 Hypertensive chronic kidney disease with stage 1 through stage 4 chronic kidney disease, or unspecified chronic kidney disease: Secondary | ICD-10-CM | POA: Diagnosis not present

## 2017-06-12 DIAGNOSIS — R531 Weakness: Secondary | ICD-10-CM

## 2017-06-12 LAB — COMPREHENSIVE METABOLIC PANEL
ALBUMIN: 2.9 g/dL — AB (ref 3.5–5.0)
ALK PHOS: 80 U/L (ref 38–126)
ALT: 18 U/L (ref 17–63)
AST: 29 U/L (ref 15–41)
Anion gap: 10 (ref 5–15)
BILIRUBIN TOTAL: 0.5 mg/dL (ref 0.3–1.2)
BUN: 19 mg/dL (ref 6–20)
CALCIUM: 9.8 mg/dL (ref 8.9–10.3)
CO2: 25 mmol/L (ref 22–32)
CREATININE: 1.98 mg/dL — AB (ref 0.61–1.24)
Chloride: 100 mmol/L — ABNORMAL LOW (ref 101–111)
GFR calc Af Amer: 39 mL/min — ABNORMAL LOW (ref >60)
GFR, EST NON AFRICAN AMERICAN: 34 mL/min — AB (ref >60)
GLUCOSE: 199 mg/dL — AB (ref 65–99)
Potassium: 3.5 mmol/L (ref 3.5–5.1)
Sodium: 135 mmol/L (ref 135–145)
TOTAL PROTEIN: 7.5 g/dL (ref 6.5–8.1)

## 2017-06-12 LAB — CBC WITH DIFFERENTIAL/PLATELET
BASOS ABS: 0.1 10*3/uL (ref 0–0.1)
BASOS PCT: 1 %
Eosinophils Absolute: 0.1 10*3/uL (ref 0–0.7)
Eosinophils Relative: 1 %
HEMATOCRIT: 24 % — AB (ref 40.0–52.0)
HEMOGLOBIN: 8.2 g/dL — AB (ref 13.0–18.0)
LYMPHS PCT: 6 %
Lymphs Abs: 0.7 10*3/uL — ABNORMAL LOW (ref 1.0–3.6)
MCH: 30.1 pg (ref 26.0–34.0)
MCHC: 34.3 g/dL (ref 32.0–36.0)
MCV: 87.8 fL (ref 80.0–100.0)
MONOS PCT: 5 %
Monocytes Absolute: 0.5 10*3/uL (ref 0.2–1.0)
NEUTROS ABS: 10.2 10*3/uL — AB (ref 1.4–6.5)
NEUTROS PCT: 87 %
Platelets: 194 10*3/uL (ref 150–440)
RBC: 2.73 MIL/uL — ABNORMAL LOW (ref 4.40–5.90)
RDW: 15.5 % — ABNORMAL HIGH (ref 11.5–14.5)
WBC: 11.6 10*3/uL — ABNORMAL HIGH (ref 3.8–10.6)

## 2017-06-12 LAB — TROPONIN I

## 2017-06-12 LAB — URINALYSIS, COMPLETE (UACMP) WITH MICROSCOPIC
BACTERIA UA: NONE SEEN
Bilirubin Urine: NEGATIVE
Glucose, UA: NEGATIVE mg/dL
KETONES UR: NEGATIVE mg/dL
Leukocytes, UA: NEGATIVE
Nitrite: NEGATIVE
PH: 6 (ref 5.0–8.0)
Protein, ur: NEGATIVE mg/dL
RBC / HPF: NONE SEEN RBC/hpf (ref 0–5)
SPECIFIC GRAVITY, URINE: 1.008 (ref 1.005–1.030)

## 2017-06-12 LAB — LACTIC ACID, PLASMA
LACTIC ACID, VENOUS: 1.6 mmol/L (ref 0.5–1.9)
Lactic Acid, Venous: 2.4 mmol/L (ref 0.5–1.9)

## 2017-06-12 LAB — GLUCOSE, CAPILLARY
Glucose-Capillary: 154 mg/dL — ABNORMAL HIGH (ref 65–99)
Glucose-Capillary: 176 mg/dL — ABNORMAL HIGH (ref 65–99)

## 2017-06-12 MED ORDER — INSULIN ASPART PROT & ASPART (70-30 MIX) 100 UNIT/ML ~~LOC~~ SUSP
55.0000 [IU] | Freq: Every day | SUBCUTANEOUS | Status: DC
  Administered 2017-06-12: 55 [IU] via SUBCUTANEOUS
  Filled 2017-06-12 (×2): qty 10

## 2017-06-12 MED ORDER — PIPERACILLIN-TAZOBACTAM 3.375 G IVPB 30 MIN
3.3750 g | Freq: Once | INTRAVENOUS | Status: AC
  Administered 2017-06-12: 3.375 g via INTRAVENOUS

## 2017-06-12 MED ORDER — AMMONIUM LACTATE 12 % EX LOTN
TOPICAL_LOTION | Freq: Two times a day (BID) | CUTANEOUS | Status: DC | PRN
  Filled 2017-06-12: qty 400

## 2017-06-12 MED ORDER — LORAZEPAM 0.5 MG PO TABS: 0.5000 mg | ORAL_TABLET | Freq: Three times a day (TID) | ORAL | Status: DC | PRN

## 2017-06-12 MED ORDER — VANCOMYCIN HCL 10 G IV SOLR
1500.0000 mg | INTRAVENOUS | Status: DC
  Administered 2017-06-12: 1500 mg via INTRAVENOUS
  Filled 2017-06-12 (×2): qty 1500

## 2017-06-12 MED ORDER — SPIRONOLACTONE 25 MG PO TABS
25.0000 mg | ORAL_TABLET | Freq: Every day | ORAL | Status: DC
  Administered 2017-06-13: 25 mg via ORAL
  Filled 2017-06-12: qty 1

## 2017-06-12 MED ORDER — METOLAZONE 2.5 MG PO TABS
2.5000 mg | ORAL_TABLET | Freq: Every day | ORAL | Status: DC
  Administered 2017-06-13: 2.5 mg via ORAL
  Filled 2017-06-12: qty 1

## 2017-06-12 MED ORDER — VANCOMYCIN HCL IN DEXTROSE 1-5 GM/200ML-% IV SOLN: 1000.0000 mg | Freq: Once | INTRAVENOUS | Status: DC

## 2017-06-12 MED ORDER — PIPERACILLIN-TAZOBACTAM 3.375 G IVPB 30 MIN
INTRAVENOUS | Status: AC
  Administered 2017-06-12: 3.375 g via INTRAVENOUS
  Filled 2017-06-12: qty 50

## 2017-06-12 MED ORDER — RIFAXIMIN 550 MG PO TABS
550.0000 mg | ORAL_TABLET | Freq: Two times a day (BID) | ORAL | Status: DC
  Administered 2017-06-12 – 2017-06-13 (×2): 550 mg via ORAL
  Filled 2017-06-12 (×3): qty 1

## 2017-06-12 MED ORDER — FUROSEMIDE 40 MG PO TABS
40.0000 mg | ORAL_TABLET | Freq: Two times a day (BID) | ORAL | Status: DC
  Administered 2017-06-12 – 2017-06-13 (×2): 40 mg via ORAL
  Filled 2017-06-12 (×2): qty 1

## 2017-06-12 MED ORDER — INSULIN ASPART 100 UNIT/ML ~~LOC~~ SOLN
0.0000 [IU] | Freq: Three times a day (TID) | SUBCUTANEOUS | Status: DC
  Administered 2017-06-12 – 2017-06-13 (×2): 2 [IU] via SUBCUTANEOUS
  Filled 2017-06-12 (×2): qty 1

## 2017-06-12 MED ORDER — VANCOMYCIN HCL 500 MG IV SOLR
500.0000 mg | Freq: Once | INTRAVENOUS | Status: DC
  Filled 2017-06-12: qty 500

## 2017-06-12 MED ORDER — VANCOMYCIN HCL IN DEXTROSE 1-5 GM/200ML-% IV SOLN
1000.0000 mg | Freq: Two times a day (BID) | INTRAVENOUS | Status: DC
  Filled 2017-06-12: qty 200

## 2017-06-12 MED ORDER — TRAMADOL HCL 50 MG PO TABS: 50.0000 mg | ORAL_TABLET | Freq: Four times a day (QID) | ORAL | Status: DC | PRN

## 2017-06-12 MED ORDER — VANCOMYCIN HCL 10 G IV SOLR
1750.0000 mg | INTRAVENOUS | Status: DC
  Filled 2017-06-12: qty 1750

## 2017-06-12 MED ORDER — INFLUENZA VAC SPLIT HIGH-DOSE 0.5 ML IM SUSY
0.5000 mL | PREFILLED_SYRINGE | INTRAMUSCULAR | Status: DC
  Filled 2017-06-12: qty 0.5

## 2017-06-12 MED ORDER — INSULIN ASPART PROT & ASPART (70-30 MIX) 100 UNIT/ML ~~LOC~~ SUSP
75.0000 [IU] | Freq: Every day | SUBCUTANEOUS | Status: DC
  Administered 2017-06-13: 75 [IU] via SUBCUTANEOUS
  Filled 2017-06-12: qty 10

## 2017-06-12 MED ORDER — VANCOMYCIN HCL IN DEXTROSE 1-5 GM/200ML-% IV SOLN
1000.0000 mg | Freq: Once | INTRAVENOUS | Status: AC
  Administered 2017-06-12: 1000 mg via INTRAVENOUS
  Filled 2017-06-12: qty 200

## 2017-06-12 MED ORDER — INSULIN ASPART 100 UNIT/ML ~~LOC~~ SOLN: 0.0000 [IU] | Freq: Every day | SUBCUTANEOUS | Status: DC

## 2017-06-12 MED ORDER — FERROUS SULFATE 325 (65 FE) MG PO TABS
325.0000 mg | ORAL_TABLET | Freq: Every day | ORAL | Status: DC
  Administered 2017-06-13: 325 mg via ORAL
  Filled 2017-06-12: qty 1

## 2017-06-12 MED ORDER — SUCRALFATE 1 G PO TABS
1.0000 g | ORAL_TABLET | Freq: Three times a day (TID) | ORAL | Status: DC
Start: 2017-06-12 — End: 2017-06-13
  Administered 2017-06-12 – 2017-06-13 (×3): 1 g via ORAL
  Filled 2017-06-12 (×3): qty 1

## 2017-06-12 MED ORDER — LACTULOSE 10 GM/15ML PO SOLN
40.0000 g | Freq: Every day | ORAL | Status: DC
Start: 2017-06-12 — End: 2017-06-13
  Filled 2017-06-12: qty 60

## 2017-06-12 NOTE — ED Notes (Signed)
Pt legs unwrapped at this time, drainage noted to wounds. MD made aware

## 2017-06-12 NOTE — ED Triage Notes (Signed)
Pt reports received a blood transfusion a few weeks ago due to weakness and low blood. Pt reports is feeling weak again and shaking. Pt concerned he needs another transfusion. Pt states has not followed up yet because they have not called with an appointment. Pt also reports swelling in his legs.

## 2017-06-12 NOTE — Consult Note (Signed)
[facility] nurse contacted by bedside nurse regarding [place] consultation. [facility] nurse will be back on [place] in the am, bedside nurse reports competency in application of Unna's boots. We discussed patient after her skin assessment and she reports no open wounds on the patient left leg, and a superficial wound on the right leg. Patient reports no topical wound care for the ulcer on the right leg, but that [facility] applies Unna's boot directly over ulcer.  I have given orders for bilateral Unna's boots to be reapplied today. Bedside RN to order frequency same as current [place] POC.  Follow up report given to [facility] nurse team.  [place], [place], [place]

## 2017-06-12 NOTE — ED Notes (Signed)
Pt c/o generalized weakness with shaking, N/V for the past few days.. Pt states "I think a blood tranfusion". States he just had one 2 weeks ago due to issues with his liver who he sees Dr. Tiffany Kocher for .Marland Kitchen States he has black stools but has them all the time due to taking an iron supplement.Marland Kitchen

## 2017-06-12 NOTE — Progress Notes (Signed)
Talked to Melody, [place] nurse on the phone.  She said they would not be here to see the patient until tomorrow.  I then described to her what the wound looked like on his right leg and also that at the wound center they only change the unna boots weekly no cream applied.  She then gave me a verbal order to apply the unna boot to the right leg and change frequency of every week.

## 2017-06-12 NOTE — H&P (Signed)
Corey West at [place] NAME: Corey West    MR#:  [id number]  DATE OF BIRTH:  Dec 22, 1950  DATE OF ADMISSION:  06/12/2017  PRIMARY CARE PHYSICIAN: Madelyn Brunner, MD   REQUESTING/REFERRING PHYSICIAN: Earleen Newport, MD  CHIEF COMPLAINT:  Chills and redness of leg  HISTORY OF PRESENT ILLNESS:  Corey West  is a 66 y.o. male with a known history of chronic liver cirrhosis, insulin requiring diabetes mellitus,anemia, depression, arthritis and multiple other medical problems is presenting to the ED with a chief complaint of weakness and feeling chills and shaky. White count is elevated and lactic acid is elevated. Patient reports worsening of the redness and swelling of the lower extremities. Patient has chronic lower extremity edema and goes to local wound care clinic.patient is started on IV antibiotics  PAST MEDICAL HISTORY:   Past Medical History:  Diagnosis Date  . Anemia   . Anxiety    controlled;   . Arthritis   . Barrett's esophagus   . Cirrhosis ([place])   . Depression    controlled;   Marland Kitchen Diabetes mellitus without complication ([place])    not controlled, taking insulin but sugar continues to run high;   Marland Kitchen GERD (gastroesophageal reflux disease)   . History of hiatal hernia   . Hypertension    controlled well;   Marland Kitchen Sleep apnea     PAST SURGICAL HISTOIRY:   Past Surgical History:  Procedure Laterality Date  . ESOPHAGOGASTRODUODENOSCOPY N/A 02/09/2015   Procedure: ESOPHAGOGASTRODUODENOSCOPY (EGD);  Surgeon: Manya Silvas, MD;  Location: [facility] ENDOSCOPY;  Service: Endoscopy;  Laterality: N/A;  . ESOPHAGOGASTRODUODENOSCOPY N/A 07/22/2015   Procedure: ESOPHAGOGASTRODUODENOSCOPY (EGD);  Surgeon: Manya Silvas, MD;  Location: [facility] ENDOSCOPY;  Service: Endoscopy;  Laterality: N/A;  . ESOPHAGOGASTRODUODENOSCOPY (EGD) WITH PROPOFOL N/A 07/20/2015   Procedure: ESOPHAGOGASTRODUODENOSCOPY (EGD) WITH PROPOFOL;  Surgeon: Manya Silvas, MD;  Location: [facility] ENDOSCOPY;  Service: Endoscopy;  Laterality: N/A;  . ESOPHAGOGASTRODUODENOSCOPY (EGD) WITH PROPOFOL N/A 09/16/2015   Procedure: ESOPHAGOGASTRODUODENOSCOPY (EGD) WITH PROPOFOL;  Surgeon: Manya Silvas, MD;  Location[facility] ENDOSCOPY;  Service: Endoscopy;  Laterality: N/A;  . ESOPHAGOGASTRODUODENOSCOPY (EGD) WITH PROPOFOL N/A 03/16/2016   Procedure: ESOPHAGOGASTRODUODENOSCOPY (EGD) WITH PROPOFOL;  Surgeon: Manya Silvas, MD;  Location: [facility] ENDOSCOPY;  Service: Endoscopy;  Laterality: N/A;  . ESOPHAGOGASTRODUODENOSCOPY (EGD) WITH PROPOFOL N/A 09/14/2016   Procedure: ESOPHAGOGASTRODUODENOSCOPY (EGD) WITH PROPOFOL;  Surgeon: Manya Silvas, MD;  Location: [facility] ENDOSCOPY;  Service: Endoscopy;  Laterality: N/A;  . ESOPHAGOGASTRODUODENOSCOPY (EGD) WITH PROPOFOL N/A 11/05/2016   Procedure: ESOPHAGOGASTRODUODENOSCOPY (EGD) WITH PROPOFOL;  Surgeon: Manya Silvas, MD;  Location: [facility] ENDOSCOPY;  Service: Endoscopy;  Laterality: N/A;  . ESOPHAGOGASTRODUODENOSCOPY (EGD) WITH PROPOFOL N/A 02/06/2017   Procedure: ESOPHAGOGASTRODUODENOSCOPY (EGD) WITH PROPOFOL;  Surgeon: Manya Silvas, MD;  Location: [facility] ENDOSCOPY;  Service: Endoscopy;  Laterality: N/A;  . ESOPHAGOGASTRODUODENOSCOPY (EGD) WITH PROPOFOL N/A 04/24/2017   Procedure: ESOPHAGOGASTRODUODENOSCOPY (EGD) WITH PROPOFOL;  Surgeon: Manya Silvas, MD;  Location: [facility] ENDOSCOPY;  Service: Endoscopy;  Laterality: N/A;  . TONSILLECTOMY    . TONSILLECTOMY AND ADENOIDECTOMY    . ULNAR NERVE TRANSPOSITION    . UVULOPALATOPHARYNGOPLASTY      SOCIAL HISTORY:   Social History  Substance Use Topics  . Smoking status: Current Some Day Smoker    Types: Cigars  . Smokeless tobacco: Never Used     Comment: smoke 1 at night occasionally;   . Alcohol use Yes  Comment: stopped 12 years ago    FAMILY HISTORY:   Family History  Problem Relation Age of Onset  . Diabetes Other     DRUG ALLERGIES:  No Known  Allergies  REVIEW OF SYSTEMS:  CONSTITUTIONAL: No fever, fatigue or weakness.  EYES: No blurred or double vision.  EARS, NOSE, AND THROAT: No tinnitus or ear pain.  RESPIRATORY: No cough, shortness of breath, wheezing or hemoptysis.  CARDIOVASCULAR: No chest pain, orthopnea, edema.  GASTROINTESTINAL: No nausea, vomiting, diarrhea or abdominal pain.  GENITOURINARY: No dysuria, hematuria.  ENDOCRINE: No polyuria, nocturia,  HEMATOLOGY: No anemia, easy bruising or bleeding SKIN: chronic leg edema which is getting worse; worsening of the redness and swelling with weeping discharge MUSCULOSKELETAL: No joint pain or arthritis.   NEUROLOGIC: No tingling, numbness, weakness.  PSYCHIATRY: No anxiety or depression.   MEDICATIONS AT HOME:   Prior to Admission medications   Medication Sig Start Date End Date Taking? Authorizing Provider  Ferrous Sulfate 140 (45 FE) MG TBCR Take 1 tablet by mouth daily.    Yes [provider]  furosemide (LASIX) 40 MG tablet Take 40 mg by mouth 2 (two) times daily.   Yes [provider]  insulin aspart protamine- aspart (NOVOLOG MIX 70/30) (70-30) 100 UNIT/ML injection Inject 0.5 mLs (50 Units total) into the skin 2 (two) times daily with a meal. Pt uses 84 units with breakfast and 62 units with supper. Patient taking differently: Inject 65-84 Units into the skin 2 (two) times daily with a meal. Pt uses 84 units with breakfast and 62 units with supper.  02/22/17  Yes Fritzi Mandes, MD  lactulose (CHRONULAC) 10 GM/15ML solution Take 40 g by mouth at bedtime.    Yes [provider]  metolazone (ZAROXOLYN) 2.5 MG tablet Take 2.5 mg by mouth daily.   Yes [provider]  rifaximin (XIFAXAN) 550 MG TABS tablet Take 550 mg by mouth 2 (two) times daily.   Yes [provider]  spironolactone (ALDACTONE) 25 MG tablet Take 25 mg by mouth daily.   Yes [provider]  sucralfate (CARAFATE) 1 g tablet Take 1 g by mouth 3 (three)  times daily.    Yes [provider]  ammonium lactate (AMLACTIN) 12 % cream Apply topically 2 (two) times daily.    [provider]  amoxicillin-clavulanate (AUGMENTIN) 875-125 MG tablet Take 1 tablet by mouth every 12 (twelve) hours. Patient not taking: Reported on 04/24/2017 02/22/17   Fritzi Mandes, MD  LORazepam (ATIVAN) 0.5 MG tablet Take 0.5 mg by mouth every 8 (eight) hours as needed for anxiety.    [provider]  traMADol (ULTRAM) 50 MG tablet Take by mouth every 6 (six) hours as needed.     [provider]      VITAL SIGNS:  Blood pressure (!) 129/45, pulse 91, temperature 99.7 F (37.6 C), temperature source Oral, resp. rate (!) 28, height 5\' 10"  (1.778 m), weight (!) 142.9 kg (315 lb), SpO2 98 %.  PHYSICAL EXAMINATION:  GENERAL:  66 y.o.-year-old patient lying in the bed with no acute distress. obese EYES: Pupils equal, round, reactive to light and accommodation. No scleral icterus. Extraocular muscles intact.  HEENT: Head atraumatic, normocephalic. Oropharynx and nasopharynx clear.  NECK:  Supple, no jugular venous distention. No thyroid enlargement, no tenderness.  LUNGS: Normal breath sounds bilaterally, no wheezing, rales,rhonchi or crepitation. No use of accessory muscles of respiration.  CARDIOVASCULAR: S1, S2 normal. No murmurs, rubs, or gallops.  ABDOMEN: Soft,  nontender, nondistended. Bowel sounds present. No organomegaly or mass.  EXTREMITIES: bilateral lower extremity chronically edematous with  chronic venous changes. Now being more erythematous than the left and right lower extremity,weeping,more edematous No cyanosis, or clubbing.  NEUROLOGIC: Cranial nerves II through XII are intact. Muscle strength 5/5 in all extremities. Sensation intact. Gait not checked.  PSYCHIATRIC: The patient is alert and oriented x 3.  SKIN: No obvious rash, lesion, or ulcer.   LABORATORY PANEL:   CBC  Recent Labs Lab 06/12/17 0912  WBC 11.6*  HGB  8.2*  HCT 24.0*  PLT 194   ------------------------------------------------------------------------------------------------------------------  Chemistries   Recent Labs Lab 06/12/17 0912  NA 135  K 3.5  CL 100*  CO2 25  GLUCOSE 199*  BUN 19  CREATININE 1.98*  CALCIUM 9.8  AST 29  ALT 18  ALKPHOS 80  BILITOT 0.5   ------------------------------------------------------------------------------------------------------------------  Cardiac Enzymes  Recent Labs Lab 06/12/17 0912  TROPONINI <0.03   ------------------------------------------------------------------------------------------------------------------  RADIOLOGY:  Dg Chest 1 View  Result Date: 06/12/2017 CLINICAL DATA:  Weakness EXAM: CHEST 1 VIEW COMPARISON:  02/08/2015 FINDINGS: The heart size and mediastinal contours are within normal limits. Both lungs are clear. The visualized skeletal structures are unremarkable. IMPRESSION: No active disease. Electronically Signed   By: Inez Catalina M.D.   On: 06/12/2017 10:54    EKG:   Orders placed or performed during the hospital encounter of 06/12/17  . ED EKG  . ED EKG  . EKG 12-Lead  . EKG 12-Lead    IMPRESSION AND PLAN:   Corey West  is a 66 y.o. male with a known history of chronic liver cirrhosis, insulin requiring diabetes mellitus,anemia, depression, arthritis and multiple other medical problems is presenting to the ED with a chief complaint of weakness and feeling chills and shaky. White count is elevated and lactic acid is elevated. Patient reports worsening of the redness and swelling of the lower extremities.   #SEPSIS secondary to bilateral lower extremity cellulitis Admit to MedSurg unit Meets septic criteria with leukocytosis and elevated lactic acid at 2.4 Antibiotics and wound care  #Bilateral lower extending to cellulitis on chronic lower extremity edema IV vancomycin Wound culture and sensitivity Unna boot Wound care consult placed,patient  follows up with outpatient wound care center  #Insulin requiring diabetes mellitus Check hemoglobin A1c provide sliding scale insulin Continue 70 /30 75 units every morning and 55 units daily at bedtime  #chronic liver cirrhosis with esophageal varices  Continue home medications lactulose and xifixan  #Tobacco abuse disorder Consultation to quit smoking for 5 minutes. He is agreeable but he is refusing nicotine patch   GI prophylaxis with Protonix DVT prophylaxis with Lovenox subcutaneous     All the records are reviewed and case discussed with ED provider. Management plans discussed with the patient, family and they are in agreement.  CODE STATUS: FULL CODE  TOTAL TIME TAKING CARE OF THIS PATIENT: 45 minutes.   Note: This dictation was prepared with Dragon dictation along with smaller phrase technology. Any transcriptional errors that result from this process are unintentional.  [name] on 06/12/2017 at 2:34 PM  Between 7am to 6pm - Pager - [phone]  After 6pm go to [email or web address] - password [email or web address] [facility]  [place] [facility]  Office  [phone]  CC: Primary care physician; Madelyn Brunner, MD

## 2017-06-12 NOTE — ED Provider Notes (Signed)
[facility] Emergency Department Provider Note       Time seen: ----------------------------------------- 9:02 AM on 06/12/2017 -----------------------------------------     I have reviewed the triage vital signs and the nursing notes.   HISTORY   Chief Complaint Weakness and Shaking    HPI Corey West is a 66 y.o. male who presents to the ED for weakness. Patient reports he is feeling weak and shaky. He was feeling this way if you weeks ago prior to this blood transfusion due to anemia. Patient thinks he probably needs another blood transfusion. Patient states she's not followed up with his doctor because they had not call for an appointment. He also reports swelling in his legs.he denies any pain, fever or other complaints. Patient does have 2 wounds on his right lower extremity that are being treated at the wound clinic.   Past Medical History:  Diagnosis Date  . Anemia   . Anxiety    controlled;   . Arthritis   . Barrett's esophagus   . Cirrhosis ([place])   . Depression    controlled;   Marland Kitchen Diabetes mellitus without complication ([place])    not controlled, taking insulin but sugar continues to run high;   Marland Kitchen GERD (gastroesophageal reflux disease)   . History of hiatal hernia   . Hypertension    controlled well;   Marland Kitchen Sleep apnea     Patient Active Problem List   Diagnosis Date Noted  . Cellulitis in diabetic foot ([place]) 02/21/2017  . Uncontrolled diabetes mellitus ([place]) 02/21/2017  . Cellulitis 02/21/2017  . GI bleed 07/20/2015  . Hematemesis 02/08/2015  . Cirrhosis ([place]) 02/08/2015  . Esophageal varices ([place]) 02/08/2015  . Type 2 diabetes mellitus ([place]) 02/08/2015  . Gastroesophageal reflux disease 02/08/2015  . Hyperlipidemia 02/08/2015  . Anemia 12/04/2013  . Thrombocytopenia ([place]) 12/04/2013  . Splenomegaly 11/04/2013    Past Surgical History:  Procedure Laterality Date  . ESOPHAGOGASTRODUODENOSCOPY N/A 02/09/2015   Procedure:  ESOPHAGOGASTRODUODENOSCOPY (EGD);  Surgeon: Manya Silvas, MD;  Location: [facility] ENDOSCOPY;  Service: Endoscopy;  Laterality: N/A;  . ESOPHAGOGASTRODUODENOSCOPY N/A 07/22/2015   Procedure: ESOPHAGOGASTRODUODENOSCOPY (EGD);  Surgeon: Manya Silvas, MD;  Location: [facility] ENDOSCOPY;  Service: Endoscopy;  Laterality: N/A;  . ESOPHAGOGASTRODUODENOSCOPY (EGD) WITH PROPOFOL N/A 07/20/2015   Procedure: ESOPHAGOGASTRODUODENOSCOPY (EGD) WITH PROPOFOL;  Surgeon: Manya Silvas, MD;  Location: [facility] ENDOSCOPY;  Service: Endoscopy;  Laterality: N/A;  . ESOPHAGOGASTRODUODENOSCOPY (EGD) WITH PROPOFOL N/A 09/16/2015   Procedure: ESOPHAGOGASTRODUODENOSCOPY (EGD) WITH PROPOFOL;  Surgeon: Manya Silvas, MD;  Location: [facility] ENDOSCOPY;  Service: Endoscopy;  Laterality: N/A;  . ESOPHAGOGASTRODUODENOSCOPY (EGD) WITH PROPOFOL N/A 03/16/2016   Procedure: ESOPHAGOGASTRODUODENOSCOPY (EGD) WITH PROPOFOL;  Surgeon: Manya Silvas, MD;  Location: [facility] ENDOSCOPY;  Service: Endoscopy;  Laterality: N/A;  . ESOPHAGOGASTRODUODENOSCOPY (EGD) WITH PROPOFOL N/A 09/14/2016   Procedure: ESOPHAGOGASTRODUODENOSCOPY (EGD) WITH PROPOFOL;  Surgeon: Manya Silvas, MD;  Location: [facility] ENDOSCOPY;  Service: Endoscopy;  Laterality: N/A;  . ESOPHAGOGASTRODUODENOSCOPY (EGD) WITH PROPOFOL N/A 11/05/2016   Procedure: ESOPHAGOGASTRODUODENOSCOPY (EGD) WITH PROPOFOL;  Surgeon: Manya Silvas, MD;  Location: [facility] ENDOSCOPY;  Service: Endoscopy;  Laterality: N/A;  . ESOPHAGOGASTRODUODENOSCOPY (EGD) WITH PROPOFOL N/A 02/06/2017   Procedure: ESOPHAGOGASTRODUODENOSCOPY (EGD) WITH PROPOFOL;  Surgeon: Manya Silvas, MD;  Location: [facility] ENDOSCOPY;  Service: Endoscopy;  Laterality: N/A;  . ESOPHAGOGASTRODUODENOSCOPY (EGD) WITH PROPOFOL N/A 04/24/2017   Procedure: ESOPHAGOGASTRODUODENOSCOPY (EGD) WITH PROPOFOL;  Surgeon: Manya Silvas, MD;  Location: [facility] ENDOSCOPY;  Service: Endoscopy;  Laterality:  N/A;  . TONSILLECTOMY    . TONSILLECTOMY AND  ADENOIDECTOMY    . ULNAR NERVE TRANSPOSITION    . UVULOPALATOPHARYNGOPLASTY      Allergies Patient has no known allergies.  Social History Social History  Substance Use Topics  . Smoking status: Current Some Day Smoker    Types: Cigars  . Smokeless tobacco: Never Used     Comment: smoke 1 at night occasionally;   . Alcohol use Yes     Comment: stopped 12 years ago    Review of Systems Constitutional: Negative for fever. Cardiovascular: Negative for chest pain. Respiratory: Negative for shortness of breath. Gastrointestinal: Negative for abdominal pain, vomiting and diarrhea. Genitourinary: Negative for dysuria. Musculoskeletal: Negative for back pain. Skin: Negative for rash. Neurological: Negative for headaches, positive for weakness  All systems negative/normal/unremarkable except as stated in the HPI  ____________________________________________   PHYSICAL EXAM:  VITAL SIGNS: ED Triage Vitals  Enc Vitals Group     BP 06/12/17 0849 (!) 159/35     Pulse Rate 06/12/17 0849 86     Resp 06/12/17 0849 20     Temp 06/12/17 0849 98.5 F (36.9 C)     Temp Source 06/12/17 0849 Oral     SpO2 06/12/17 0849 100 %     Weight 06/12/17 0850 (!) 315 lb (142.9 kg)     Height 06/12/17 0850 5\' 10"  (1.778 m)     Head Circumference --      Peak Flow --      Pain Score --      Pain Loc --      Pain Edu? --      Excl. in [place]? --     Constitutional: Alert and oriented. Well appearing and in no distress. Eyes: Conjunctivae are normal. Normal extraocular movements. ENT   Head: Normocephalic and atraumatic.   Nose: No congestion/rhinnorhea.   Mouth/Throat: Mucous membranes are moist.   Neck: No stridor. Cardiovascular: Normal rate, regular rhythm. No murmurs, rubs, or gallops. Respiratory: Normal respiratory effort without tachypnea nor retractions. Breath sounds are clear and equal bilaterally. No wheezes/rales/rhonchi. Gastrointestinal: Soft and nontender. Normal  bowel sounds Musculoskeletal: Nontender with normal range of motion in extremities. No lower extremity tenderness nor edema. Neurologic:  Normal speech and language. No gross focal neurologic deficits are appreciated.  Skin:  Skin is warm, dry and intact. No rash noted. Psychiatric: Mood and affect are normal. Speech and behavior are normal.  ____________________________________________  EKG: Interpreted by me.age for ablation with a rate of 92 bpm, normal QRS, long QT, normal axis  ____________________________________________  ED COURSE:  Pertinent labs & imaging results that were available during my care of the patient were reviewed by me and considered in my medical decision making (see chart for details). Patient presents for weakness, we will assess with labs and imaging as indicated.   Procedures ____________________________________________   LABS (pertinent positives/negatives)  Labs Reviewed  CBC WITH DIFFERENTIAL/PLATELET - Abnormal; Notable for the following:       Result Value   WBC 11.6 (*)    RBC 2.73 (*)    Hemoglobin 8.2 (*)    HCT 24.0 (*)    RDW 15.5 (*)    Neutro Abs 10.2 (*)    Lymphs Abs 0.7 (*)    All other components within normal limits  COMPREHENSIVE METABOLIC PANEL - Abnormal; Notable for the following:    Chloride 100 (*)    Glucose, Bld 199 (*)    Creatinine, Ser 1.98 (*)  Albumin 2.9 (*)    GFR calc non Af Amer 34 (*)    GFR calc Af Amer 39 (*)    All other components within normal limits  URINALYSIS, COMPLETE (UACMP) WITH MICROSCOPIC - Abnormal; Notable for the following:    Color, Urine STRAW (*)    APPearance CLEAR (*)    Hgb urine dipstick SMALL (*)    Squamous Epithelial / LPF 0-5 (*)    All other components within normal limits  LACTIC ACID, PLASMA - Abnormal; Notable for the following:    Lactic Acid, Venous 2.4 (*)    All other components within normal limits  CULTURE, BLOOD (ROUTINE X 2)  CULTURE, BLOOD (ROUTINE X 2)  TROPONIN  I  LACTIC ACID, PLASMA    ____________________________________________  FINAL ASSESSMENT AND PLAN  weakness, chronic anemia, Chronic venous stasis, cellulitis   Plan: Patient's labs and imaging were dictated above. Patient had presented for weakness and right are switches likely coming from early wound infection. Patient was wearing bilateral Unna boot wraps and has some signs of cellulitis particularly on the right leg. Due to his elevated lactic acid and his weakness with riders we have ordered broad-spectrum metabolic coverage. He is stable for admission at this time.   Earleen Newport, MD   Note: This note was generated in part or whole with voice recognition software. Voice recognition is usually quite accurate but there are transcription errors that can and very often do occur. I apologize for any typographical errors that were not detected and corrected.     Earleen Newport, MD 06/12/17 1245

## 2017-06-12 NOTE — Progress Notes (Signed)
Pharmacy Antibiotic Note  Corey West is a 66 y.o. male admitted on 06/12/2017 with sepsis secondary to cellulitis.  Pharmacy has been consulted for vancomycin dosing. Patient received vancomycin 1g IV x 1 dose in ED.  Plan: Ke: 0.048   T1/2: 14.4   VD: 70  Will start patient on Vancomycin 1500mg  IV every 18 hours with 6 hour stack dosing. Calculated trough at Css is 15.5. Trough ordered prior to 4th dose. Will monitor renal function and adjust dose as needed.    Height: 5\' 10"  (177.8 cm) Weight: (!) 315 lb (142.9 kg) IBW/kg (Calculated) : 73  Temp (24hrs), Avg:99.3 F (37.4 C), Min:98.5 F (36.9 C), Max:99.8 F (37.7 C)   Recent Labs Lab 06/12/17 0912 06/12/17 1053 06/12/17 1324  WBC 11.6*  --   --   CREATININE 1.98*  --   --   LATICACIDVEN  --  2.4* 1.6    Estimated Creatinine Clearance: 53.1 mL/min (A) (by C-G formula based on SCr of 1.98 mg/dL (H)).    No Known Allergies  Antimicrobials this admission: 9/12 vancomycin >>   Dose adjustments this admission:   Microbiology results: 9/12 BCx: sent 9/12 aerobic.anaerobic Cx: pending   Thank you for allowing pharmacy to be a part of this patient's care.  Pernell Dupre, PharmD, BCPS Clinical Pharmacist 06/12/2017 4:06 PM

## 2017-06-12 NOTE — ED Notes (Signed)
Date and time results received: 06/12/17 1150 (use smartphrase ".now" to insert current time)  Test: lactic acid Critical Value: 2.4  Name of Provider Notified: MD Jimmye Norman

## 2017-06-13 ENCOUNTER — Encounter: Payer: Medicare Other | Admitting: Occupational Therapy

## 2017-06-13 DIAGNOSIS — A419 Sepsis, unspecified organism: Secondary | ICD-10-CM | POA: Diagnosis not present

## 2017-06-13 LAB — HEMOGLOBIN A1C
Hgb A1c MFr Bld: 6.9 % — ABNORMAL HIGH (ref 4.8–5.6)
Mean Plasma Glucose: 151.33 mg/dL

## 2017-06-13 LAB — GLUCOSE, CAPILLARY: Glucose-Capillary: 153 mg/dL — ABNORMAL HIGH (ref 65–99)

## 2017-06-13 NOTE — Progress Notes (Signed)
Corey West  A and O x 4. VSS. Pt tolerating diet well. No complaints of pain or nausea. IV removed intact, prescriptions given. Pt voiced understanding of discharge instructions with no further questions. Pt discharged via wheelchair with nurse. Pt driving himself home.  [name] MSN, RN-BC  Allergies as of 06/13/2017   No Known Allergies     Medication List    STOP taking these medications   amoxicillin-clavulanate 875-125 MG tablet Commonly known as:  AUGMENTIN     TAKE these medications   ammonium lactate 12 % cream Commonly known as:  AMLACTIN Apply topically 2 (two) times daily.   Ferrous Sulfate 140 (45 Fe) MG Tbcr Take 1 tablet by mouth daily.   furosemide 40 MG tablet Commonly known as:  LASIX Take 40 mg by mouth 2 (two) times daily.   insulin aspart protamine- aspart (70-30) 100 UNIT/ML injection Commonly known as:  NOVOLOG MIX 70/30 Inject 0.5 mLs (50 Units total) into the skin 2 (two) times daily with a meal. Pt uses 84 units with breakfast and 62 units with supper. What changed:  how much to take  additional instructions   lactulose 10 GM/15ML solution Commonly known as:  CHRONULAC Take 40 g by mouth at bedtime.   LORazepam 0.5 MG tablet Commonly known as:  ATIVAN Take 0.5 mg by mouth every 8 (eight) hours as needed for anxiety.   metolazone 2.5 MG tablet Commonly known as:  ZAROXOLYN Take 2.5 mg by mouth daily.   rifaximin 550 MG Tabs tablet Commonly known as:  XIFAXAN Take 550 mg by mouth 2 (two) times daily.   spironolactone 25 MG tablet Commonly known as:  ALDACTONE Take 25 mg by mouth daily.   sucralfate 1 g tablet Commonly known as:  CARAFATE Take 1 g by mouth 3 (three) times daily.   traMADol 50 MG tablet Commonly known as:  ULTRAM Take by mouth every 6 (six) hours as needed.            Discharge Care Instructions        Start     Ordered   06/13/17 0000  Activity as tolerated - No restrictions     06/13/17 1048   06/13/17 0000  Diet - low sodium heart healthy     06/13/17 1048   06/13/17 0000  Diet Carb Modified     06/13/17 1048      Vitals:   06/13/17 0708 06/13/17 1028  BP: (!) 120/45 (!) 136/51  Pulse: 70   Resp: 18   Temp: 98.1 F (36.7 C)   SpO2: 100%

## 2017-06-13 NOTE — Discharge Summary (Signed)
Corey West at [place] NAME: Corey West    MR#:  [id number]  DATE OF BIRTH:  November 11, 1950  DATE OF ADMISSION:  06/12/2017 ADMITTING PHYSICIAN: Nicholes Mango, MD  DATE OF DISCHARGE: 06/13/2017 11:25 AM  PRIMARY CARE PHYSICIAN: Madelyn Brunner, MD    ADMISSION DIAGNOSIS:  Lactic acidosis [E87.2] Weakness [R53.1] Cellulitis, unspecified cellulitis site [L03.90]  DISCHARGE DIAGNOSIS:  Active Problems:   Sepsis ([place])   SECONDARY DIAGNOSIS:   Past Medical History:  Diagnosis Date  . Anemia   . Anxiety    controlled;   . Arthritis   . Barrett's esophagus   . Cirrhosis ([place])   . Depression    controlled;   Marland Kitchen Diabetes mellitus without complication ([place])    not controlled, taking insulin but sugar continues to run high;   Marland Kitchen GERD (gastroesophageal reflux disease)   . History of hiatal hernia   . Hypertension    controlled well;   Marland Kitchen Sleep apnea     HOSPITAL COURSE:   Corey West  is a 66 y.o. male with a known history of chronic liver cirrhosis, insulin requiring diabetes mellitus,anemia, depression, arthritis and multiple other medical problems is presenting to the ED with a chief complaint of weakness and feeling chills and shaky. White count is elevated and lactic acid is elevated. Patient reports worsening of the redness and swelling of the lower extremities.   #SEPSIS - this has been ruled out now.  This was suspected on admission given patient's mild leukocytosis and elevated lactic acid. Lactic acid has now normalized and patient's lactate was likely elevated secondary to his chronic liver disease. Empirically patient was placed on IV vancomycin for cellulitis but his lower extemity edema and redness is secondary to chronic venous stasis and chronic lymphedema. Patient is not being discharged on any abx.  - sepsis has been ruled out.    #Bilateral lower ext edema w/ redness - this was due to chronic venous stasis and lymphedema.  -  pt. Was empirically started on Vancomycin but presently is not being  Discharged on any antibiotics - patient was seen by the wound team and UNNA boots was placed to the right lower extremity and patient will continue follow-up with the wound center as outpatient.   #Insulin requiring diabetes mellitus - pt. Will resume his insulin 70/30 mix upon discharge.  - no acute issues while in the hospital.   #chronic liver cirrhosis with esophageal varices  - no evidence of ascites or Hepatic Encephalopathy while in the hospital. Pt. Will cont. His Xifaxan, lactulose upon discharge.  - pt. Will resume his Aldactone, Lasix.    DISCHARGE CONDITIONS:   Stable.   CONSULTS OBTAINED:    DRUG ALLERGIES:  No Known Allergies  DISCHARGE MEDICATIONS:   Allergies as of 06/13/2017   No Known Allergies     Medication List    STOP taking these medications   amoxicillin-clavulanate 875-125 MG tablet Commonly known as:  AUGMENTIN     TAKE these medications   ammonium lactate 12 % cream Commonly known as:  AMLACTIN Apply topically 2 (two) times daily.   Ferrous Sulfate 140 (45 Fe) MG Tbcr Take 1 tablet by mouth daily.   furosemide 40 MG tablet Commonly known as:  LASIX Take 40 mg by mouth 2 (two) times daily.   insulin aspart protamine- aspart (70-30) 100 UNIT/ML injection Commonly known as:  NOVOLOG MIX 70/30 Inject 0.5 mLs (50 Units total) into the skin  2 (two) times daily with a meal. Pt uses 84 units with breakfast and 62 units with supper. What changed:  how much to take  additional instructions   lactulose 10 GM/15ML solution Commonly known as:  CHRONULAC Take 40 g by mouth at bedtime.   LORazepam 0.5 MG tablet Commonly known as:  ATIVAN Take 0.5 mg by mouth every 8 (eight) hours as needed for anxiety.   metolazone 2.5 MG tablet Commonly known as:  ZAROXOLYN Take 2.5 mg by mouth daily.   rifaximin 550 MG Tabs tablet Commonly known as:  XIFAXAN Take 550 mg by mouth 2  (two) times daily.   spironolactone 25 MG tablet Commonly known as:  ALDACTONE Take 25 mg by mouth daily.   sucralfate 1 g tablet Commonly known as:  CARAFATE Take 1 g by mouth 3 (three) times daily.   traMADol 50 MG tablet Commonly known as:  ULTRAM Take by mouth every 6 (six) hours as needed.            Discharge Care Instructions        Start     Ordered   06/13/17 0000  Activity as tolerated - No restrictions     06/13/17 1048   06/13/17 0000  Diet - low sodium heart healthy     06/13/17 1048   06/13/17 0000  Diet Carb Modified     06/13/17 1048        DISCHARGE INSTRUCTIONS:   DIET:  Diabetic diet  DISCHARGE CONDITION:  Stable  ACTIVITY:  Activity as tolerated  OXYGEN:  Home Oxygen: No.   Oxygen Delivery: room air  DISCHARGE LOCATION:  home   If you experience worsening of your admission symptoms, develop shortness of breath, life threatening emergency, suicidal or homicidal thoughts you must seek medical attention immediately by calling 911 or calling your MD immediately  if symptoms less severe.  You Must read complete instructions/literature along with all the possible adverse reactions/side effects for all the Medicines you take and that have been prescribed to you. Take any new Medicines after you have completely understood and accpet all the possible adverse reactions/side effects.   Please note  You were cared for by a hospitalist during your hospital stay. If you have any questions about your discharge medications or the care you received while you were in the hospital after you are discharged, you can call the unit and asked to speak with the hospitalist on call if the hospitalist that took care of you is not available. Once you are discharged, your primary care physician will handle any further medical issues. Please note that NO REFILLS for any discharge medications will be authorized once you are discharged, as it is imperative that you  return to your primary care physician (or establish a relationship with a primary care physician if you do not have one) for your aftercare needs so that they can reassess your need for medications and monitor your lab values.     Today   No acute complaints presently. Feels better and wants to go home.   VITAL SIGNS:  Blood pressure (!) 136/51, pulse 70, temperature 98.1 F (36.7 C), temperature source Oral, resp. rate 18, height 5\' 10"  (1.778 m), weight (!) 142.9 kg (315 lb), SpO2 100 %.  I/O:   Intake/Output Summary (Last 24 hours) at 06/13/17 1532 Last data filed at 06/13/17 1027  Gross per 24 hour  Intake  1020 ml  Output             1000 ml  Net               20 ml    PHYSICAL EXAMINATION:  GENERAL:  66 y.o.-year-old patient lying in the bed with no acute distress.  EYES: Pupils equal, round, reactive to light and accommodation. No scleral icterus. Extraocular muscles intact.  HEENT: Head atraumatic, normocephalic. Oropharynx and nasopharynx clear.  NECK:  Supple, no jugular venous distention. No thyroid enlargement, no tenderness.  LUNGS: Normal breath sounds bilaterally, no wheezing, rales,rhonchi. No use of accessory muscles of respiration.  CARDIOVASCULAR: S1, S2 normal. No murmurs, rubs, or gallops.  ABDOMEN: Soft, non-tender, non-distended. Bowel sounds present. No organomegaly or mass.  EXTREMITIES: +2-3 edema b/l.  RLE wrapped in UNNA boots.  Chronic venous stasis changes b/l, No cyanosis, or clubbing.  NEUROLOGIC: Cranial nerves II through XII are intact. No focal motor or sensory defecits b/l.  PSYCHIATRIC: The patient is alert and oriented x 3. SKIN: No obvious rash, lesion, or ulcer.   DATA REVIEW:   CBC  Recent Labs Lab 06/12/17 0912  WBC 11.6*  HGB 8.2*  HCT 24.0*  PLT 194    Chemistries   Recent Labs Lab 06/12/17 0912  NA 135  K 3.5  CL 100*  CO2 25  GLUCOSE 199*  BUN 19  CREATININE 1.98*  CALCIUM 9.8  AST 29  ALT 18   ALKPHOS 80  BILITOT 0.5    Cardiac Enzymes  Recent Labs Lab 06/12/17 0912  TROPONINI <0.03    Microbiology Results  Results for orders placed or performed during the hospital encounter of 06/12/17  Blood culture (routine x 2)     Status: None (Preliminary result)   Collection Time: 06/12/17 10:53 AM  Result Value Ref Range Status   Specimen Description BLOOD BLOOD LEFT HAND  Final   Special Requests   Final    BOTTLES DRAWN AEROBIC AND ANAEROBIC Blood Culture adequate volume   Culture NO GROWTH < 24 HOURS  Final   Report Status PENDING  Incomplete  Blood culture (routine x 2)     Status: None (Preliminary result)   Collection Time: 06/12/17 10:53 AM  Result Value Ref Range Status   Specimen Description BLOOD BLOOD RIGHT FOREARM  Final   Special Requests   Final    BOTTLES DRAWN AEROBIC AND ANAEROBIC Blood Culture results may not be optimal due to an excessive volume of blood received in culture bottles   Culture NO GROWTH < 24 HOURS  Final   Report Status PENDING  Incomplete  Aerobic/Anaerobic Culture (surgical/deep wound)     Status: None (Preliminary result)   Collection Time: 06/12/17  4:28 PM  Result Value Ref Range Status   Specimen Description LEG  Final   Special Requests NONE  Final   Gram Stain   Final    FEW WBC PRESENT, PREDOMINANTLY PMN FEW GRAM NEGATIVE RODS FEW GRAM POSITIVE COCCI    Culture   Final    TOO YOUNG TO READ Performed at [facility], [place] [address], [place], [place] [address]    Report Status PENDING  Incomplete    RADIOLOGY:  Dg Chest 1 View  Result Date: 06/12/2017 CLINICAL DATA:  Weakness EXAM: CHEST 1 VIEW COMPARISON:  02/08/2015 FINDINGS: The heart size and mediastinal contours are within normal limits. Both lungs are clear. The visualized skeletal structures are unremarkable. IMPRESSION: No active disease. Electronically Signed  By: Inez Catalina M.D.   On: 06/12/2017 10:54      Management plans discussed with the  patient, family and they are in agreement.  CODE STATUS:  Code Status History    Date Active Date Inactive Code Status Order ID Comments User Context   02/21/2017  8:07 PM 02/22/2017  6:03 PM Full Code [id number]  [name], MD Inpatient   07/20/2015 12:45 PM 07/22/2015 10:17 PM Full Code [id number]  Nicholes Mango, MD Inpatient   02/08/2015 11:31 PM 02/11/2015  7:43 PM Full Code [id number]  Hower, Aaron Mose, MD ED      TOTAL TIME TAKING CARE OF THIS PATIENT: 40 minutes.    Henreitta Leber M.D on 06/13/2017 at 3:32 PM  Between 7am to 6pm - Pager - [phone]  After 6pm go to [email or web address] - Proofreader  [facility]  Office  [phone]  CC: Primary care physician; Madelyn Brunner, MD

## 2017-06-17 ENCOUNTER — Encounter: Payer: Medicare Other | Admitting: Occupational Therapy

## 2017-06-17 LAB — CULTURE, BLOOD (ROUTINE X 2)
CULTURE: NO GROWTH
CULTURE: NO GROWTH
SPECIAL REQUESTS: ADEQUATE

## 2017-06-18 ENCOUNTER — Encounter: Payer: Medicare Other | Admitting: Occupational Therapy

## 2017-06-18 LAB — AEROBIC/ANAEROBIC CULTURE (SURGICAL/DEEP WOUND)

## 2017-06-18 LAB — AEROBIC/ANAEROBIC CULTURE W GRAM STAIN (SURGICAL/DEEP WOUND)

## 2017-06-20 ENCOUNTER — Encounter: Payer: Medicare Other | Admitting: Occupational Therapy

## 2017-06-24 ENCOUNTER — Encounter: Payer: Medicare Other | Admitting: Occupational Therapy

## 2017-06-25 ENCOUNTER — Encounter: Payer: Medicare Other | Admitting: Occupational Therapy

## 2017-06-25 ENCOUNTER — Ambulatory Visit: Payer: Medicare Other | Admitting: Internal Medicine

## 2017-06-25 ENCOUNTER — Encounter: Payer: Self-pay | Admitting: *Deleted

## 2017-06-26 ENCOUNTER — Ambulatory Visit: Payer: Medicare Other | Admitting: Anesthesiology

## 2017-06-26 ENCOUNTER — Encounter
Admission: RE | Disposition: A | Discharge status: Home / Self Care | Payer: Self-pay | Source: Home / Self Care | Attending: Internal Medicine

## 2017-06-26 ENCOUNTER — Inpatient Hospital Stay
Admission: RE | Admit: 2017-06-26 | Discharge: 2017-06-28 | DRG: 378 | Disposition: A | Discharge status: Home / Self Care | Payer: Medicare Other | Attending: Internal Medicine | Admitting: Internal Medicine

## 2017-06-26 DIAGNOSIS — K219 Gastro-esophageal reflux disease without esophagitis: Secondary | ICD-10-CM | POA: Diagnosis present

## 2017-06-26 DIAGNOSIS — K2270 Barrett's esophagus without dysplasia: Secondary | ICD-10-CM | POA: Diagnosis present

## 2017-06-26 DIAGNOSIS — F17290 Nicotine dependence, other tobacco product, uncomplicated: Secondary | ICD-10-CM | POA: Diagnosis present

## 2017-06-26 DIAGNOSIS — Z6841 Body Mass Index (BMI) 40.0 and over, adult: Secondary | ICD-10-CM

## 2017-06-26 DIAGNOSIS — D62 Acute posthemorrhagic anemia: Secondary | ICD-10-CM | POA: Diagnosis present

## 2017-06-26 DIAGNOSIS — I129 Hypertensive chronic kidney disease with stage 1 through stage 4 chronic kidney disease, or unspecified chronic kidney disease: Secondary | ICD-10-CM | POA: Diagnosis present

## 2017-06-26 DIAGNOSIS — K922 Gastrointestinal hemorrhage, unspecified: Secondary | ICD-10-CM | POA: Diagnosis present

## 2017-06-26 DIAGNOSIS — I878 Other specified disorders of veins: Secondary | ICD-10-CM | POA: Diagnosis present

## 2017-06-26 DIAGNOSIS — R600 Localized edema: Secondary | ICD-10-CM | POA: Diagnosis present

## 2017-06-26 DIAGNOSIS — K31811 Angiodysplasia of stomach and duodenum with bleeding: Principal | ICD-10-CM | POA: Diagnosis present

## 2017-06-26 DIAGNOSIS — E1122 Type 2 diabetes mellitus with diabetic chronic kidney disease: Secondary | ICD-10-CM | POA: Diagnosis present

## 2017-06-26 DIAGNOSIS — G4730 Sleep apnea, unspecified: Secondary | ICD-10-CM | POA: Diagnosis present

## 2017-06-26 DIAGNOSIS — F329 Major depressive disorder, single episode, unspecified: Secondary | ICD-10-CM | POA: Diagnosis present

## 2017-06-26 DIAGNOSIS — N183 Chronic kidney disease, stage 3 (moderate): Secondary | ICD-10-CM | POA: Diagnosis present

## 2017-06-26 DIAGNOSIS — F419 Anxiety disorder, unspecified: Secondary | ICD-10-CM | POA: Diagnosis present

## 2017-06-26 DIAGNOSIS — Z23 Encounter for immunization: Secondary | ICD-10-CM

## 2017-06-26 DIAGNOSIS — K7460 Unspecified cirrhosis of liver: Secondary | ICD-10-CM | POA: Diagnosis present

## 2017-06-26 DIAGNOSIS — D649 Anemia, unspecified: Secondary | ICD-10-CM | POA: Diagnosis present

## 2017-06-26 DIAGNOSIS — Z794 Long term (current) use of insulin: Secondary | ICD-10-CM

## 2017-06-26 DIAGNOSIS — K2971 Gastritis, unspecified, with bleeding: Secondary | ICD-10-CM | POA: Diagnosis present

## 2017-06-26 DIAGNOSIS — Z79899 Other long term (current) drug therapy: Secondary | ICD-10-CM

## 2017-06-26 DIAGNOSIS — M1991 Primary osteoarthritis, unspecified site: Secondary | ICD-10-CM | POA: Diagnosis present

## 2017-06-26 HISTORY — PX: ESOPHAGOGASTRODUODENOSCOPY: SHX5428

## 2017-06-26 HISTORY — DX: Chronic kidney disease, unspecified: N18.9

## 2017-06-26 HISTORY — DX: Angiodysplasia of stomach and duodenum with bleeding: K31.811

## 2017-06-26 HISTORY — DX: Polyp of stomach and duodenum: K31.7

## 2017-06-26 HISTORY — DX: Esophageal varices without bleeding: I85.00

## 2017-06-26 HISTORY — DX: Other specified disorders of veins: I87.8

## 2017-06-26 LAB — CBC WITH DIFFERENTIAL/PLATELET
Basophils Absolute: 0 10*3/uL (ref 0–0.1)
Basophils Relative: 1 %
Eosinophils Absolute: 0.1 10*3/uL (ref 0–0.7)
Eosinophils Relative: 3 %
HEMATOCRIT: 17.9 % — AB (ref 40.0–52.0)
HEMOGLOBIN: 6.1 g/dL — AB (ref 13.0–18.0)
LYMPHS ABS: 0.7 10*3/uL — AB (ref 1.0–3.6)
Lymphocytes Relative: 13 %
MCH: 29.2 pg (ref 26.0–34.0)
MCHC: 33.8 g/dL (ref 32.0–36.0)
MCV: 86.6 fL (ref 80.0–100.0)
MONOS PCT: 6 %
Monocytes Absolute: 0.3 10*3/uL (ref 0.2–1.0)
NEUTROS ABS: 3.9 10*3/uL (ref 1.4–6.5)
NEUTROS PCT: 77 %
Platelets: 116 10*3/uL — ABNORMAL LOW (ref 150–440)
RBC: 2.07 MIL/uL — ABNORMAL LOW (ref 4.40–5.90)
RDW: 15.4 % — ABNORMAL HIGH (ref 11.5–14.5)
WBC: 5.1 10*3/uL (ref 3.8–10.6)

## 2017-06-26 LAB — ABO/RH: ABO/RH(D): A NEG

## 2017-06-26 LAB — PROTIME-INR
INR: 1.24
Prothrombin Time: 15.5 seconds — ABNORMAL HIGH (ref 11.4–15.2)

## 2017-06-26 LAB — GLUCOSE, CAPILLARY
GLUCOSE-CAPILLARY: 188 mg/dL — AB (ref 65–99)
GLUCOSE-CAPILLARY: 240 mg/dL — AB (ref 65–99)
Glucose-Capillary: 160 mg/dL — ABNORMAL HIGH (ref 65–99)

## 2017-06-26 LAB — PREPARE RBC (CROSSMATCH)

## 2017-06-26 SURGERY — EGD (ESOPHAGOGASTRODUODENOSCOPY)
Anesthesia: General

## 2017-06-26 MED ORDER — AMMONIUM LACTATE 12 % EX CREA: TOPICAL_CREAM | Freq: Two times a day (BID) | CUTANEOUS | Status: DC

## 2017-06-26 MED ORDER — INSULIN ASPART 100 UNIT/ML ~~LOC~~ SOLN
0.0000 [IU] | Freq: Every day | SUBCUTANEOUS | Status: DC
  Administered 2017-06-26 – 2017-06-27 (×2): 2 [IU] via SUBCUTANEOUS
  Filled 2017-06-26 (×2): qty 1

## 2017-06-26 MED ORDER — ACETAMINOPHEN 650 MG RE SUPP
650.0000 mg | Freq: Four times a day (QID) | RECTAL | Status: DC | PRN
  Filled 2017-06-26: qty 1

## 2017-06-26 MED ORDER — SODIUM CHLORIDE 0.9 % IV SOLN
Freq: Once | INTRAVENOUS | Status: AC
  Administered 2017-06-26: 15:00:00 via INTRAVENOUS

## 2017-06-26 MED ORDER — METOLAZONE 2.5 MG PO TABS
2.5000 mg | ORAL_TABLET | Freq: Every day | ORAL | Status: DC
  Administered 2017-06-26 – 2017-06-27 (×2): 2.5 mg via ORAL
  Filled 2017-06-26 (×3): qty 1

## 2017-06-26 MED ORDER — ONDANSETRON HCL 4 MG/2ML IJ SOLN: 4.0000 mg | Freq: Four times a day (QID) | INTRAMUSCULAR | Status: DC | PRN

## 2017-06-26 MED ORDER — SODIUM CHLORIDE 0.9 % IV SOLN
INTRAVENOUS | Status: DC
  Administered 2017-06-26: 08:00:00 via INTRAVENOUS

## 2017-06-26 MED ORDER — RIFAXIMIN 550 MG PO TABS
550.0000 mg | ORAL_TABLET | Freq: Two times a day (BID) | ORAL | Status: DC
Start: 2017-06-26 — End: 2017-06-28
  Administered 2017-06-26 – 2017-06-27 (×4): 550 mg via ORAL
  Filled 2017-06-26 (×4): qty 1

## 2017-06-26 MED ORDER — ONDANSETRON HCL 4 MG PO TABS: 4.0000 mg | ORAL_TABLET | Freq: Four times a day (QID) | ORAL | Status: DC | PRN

## 2017-06-26 MED ORDER — TRAMADOL HCL 50 MG PO TABS
50.0000 mg | ORAL_TABLET | Freq: Four times a day (QID) | ORAL | Status: DC | PRN
  Administered 2017-06-27: 50 mg via ORAL
  Filled 2017-06-26: qty 1

## 2017-06-26 MED ORDER — ACETAMINOPHEN 325 MG PO TABS: 650.0000 mg | ORAL_TABLET | Freq: Four times a day (QID) | ORAL | Status: DC | PRN

## 2017-06-26 MED ORDER — INSULIN ASPART 100 UNIT/ML ~~LOC~~ SOLN
0.0000 [IU] | Freq: Three times a day (TID) | SUBCUTANEOUS | Status: DC
  Administered 2017-06-26: 18:00:00 2 [IU] via SUBCUTANEOUS
  Administered 2017-06-27: 17:00:00 7 [IU] via SUBCUTANEOUS
  Administered 2017-06-27: 2 [IU] via SUBCUTANEOUS
  Filled 2017-06-26 (×3): qty 1

## 2017-06-26 MED ORDER — FERROUS SULFATE 325 (65 FE) MG PO TABS
325.0000 mg | ORAL_TABLET | Freq: Every day | ORAL | Status: DC
  Administered 2017-06-27: 10:00:00 325 mg via ORAL
  Filled 2017-06-26: qty 1

## 2017-06-26 MED ORDER — MIDAZOLAM HCL 2 MG/2ML IJ SOLN
INTRAMUSCULAR | Status: AC
  Filled 2017-06-26: qty 2

## 2017-06-26 MED ORDER — FENTANYL CITRATE (PF) 100 MCG/2ML IJ SOLN
INTRAMUSCULAR | Status: AC
  Filled 2017-06-26: qty 2

## 2017-06-26 MED ORDER — FUROSEMIDE 40 MG PO TABS
40.0000 mg | ORAL_TABLET | Freq: Two times a day (BID) | ORAL | Status: DC
  Administered 2017-06-26 – 2017-06-27 (×3): 40 mg via ORAL
  Filled 2017-06-26 (×3): qty 1

## 2017-06-26 MED ORDER — INFLUENZA VAC SPLIT HIGH-DOSE 0.5 ML IM SUSY
0.5000 mL | PREFILLED_SYRINGE | INTRAMUSCULAR | Status: AC
  Administered 2017-06-27: 0.5 mL via INTRAMUSCULAR
  Filled 2017-06-26: qty 0.5

## 2017-06-26 MED ORDER — PANTOPRAZOLE SODIUM 40 MG PO TBEC
80.0000 mg | DELAYED_RELEASE_TABLET | Freq: Every day | ORAL | Status: DC
  Administered 2017-06-26 – 2017-06-27 (×2): 80 mg via ORAL
  Filled 2017-06-26 (×2): qty 2

## 2017-06-26 MED ORDER — LORAZEPAM 0.5 MG PO TABS
0.5000 mg | ORAL_TABLET | Freq: Three times a day (TID) | ORAL | Status: DC | PRN
  Administered 2017-06-27: 0.5 mg via ORAL
  Filled 2017-06-26: qty 1

## 2017-06-26 MED ORDER — SPIRONOLACTONE 25 MG PO TABS
25.0000 mg | ORAL_TABLET | Freq: Every day | ORAL | Status: DC
  Administered 2017-06-26 – 2017-06-27 (×2): 25 mg via ORAL
  Filled 2017-06-26 (×2): qty 1

## 2017-06-26 MED ORDER — SUCRALFATE 1 G PO TABS
1.0000 g | ORAL_TABLET | Freq: Three times a day (TID) | ORAL | Status: DC
  Administered 2017-06-26 – 2017-06-27 (×5): 1 g via ORAL
  Filled 2017-06-26 (×5): qty 1

## 2017-06-26 NOTE — Anesthesia Preprocedure Evaluation (Signed)
Anesthesia Evaluation  Patient identified by MRN, date of birth, ID band Patient awake    Reviewed: Allergy & Precautions, NPO status , Patient's Chart, lab work & pertinent test results  History of Anesthesia Complications Negative for: history of anesthetic complications  Airway Mallampati: III       Dental   Pulmonary sleep apnea , neg COPD, Current Smoker,           Cardiovascular hypertension, Pt. on medications + Peripheral Vascular Disease       Neuro/Psych neg Seizures Anxiety Depression    GI/Hepatic hiatal hernia, GERD  Medicated,Liver failure secondary to diabetes    Endo/Other  diabetes, Type 2, Oral Hypoglycemic Agents  Renal/GU Renal InsufficiencyRenal disease     Musculoskeletal   Abdominal   Peds  Hematology  (+) anemia ,   Anesthesia Other Findings   Reproductive/Obstetrics                             Anesthesia Physical Anesthesia Plan  ASA: III  Anesthesia Plan: General   Post-op Pain Management:    Induction: Intravenous  PONV Risk Score and Plan:   Airway Management Planned: Nasal Cannula  Additional Equipment:   Intra-op Plan:   Post-operative Plan:   Informed Consent: I have reviewed the patients History and Physical, chart, labs and discussed the procedure including the risks, benefits and alternatives for the proposed anesthesia with the patient or authorized representative who has indicated his/her understanding and acceptance.     Plan Discussed with:   Anesthesia Plan Comments:         Anesthesia Quick Evaluation

## 2017-06-26 NOTE — Consult Note (Signed)
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  Patient was to have EGD today but appeared pale and I ordered a CBC showing hgb of 6 which is too low for him and I asked Hospitalist to admit and transfuse him which is in progress now.  Will give him 2 units of blood and see what Hgb is tomorrow but will likely be high enough to scope him tomorrow.  Eyes and lips pale Chest clear, heart RRR, abd obese, non tender.  Ext shows severe lymphadema.

## 2017-06-26 NOTE — Consult Note (Signed)
See first part of note.  Plan to do EGD tomorrow, discussed with him.  Check hgb in am.

## 2017-06-26 NOTE — Progress Notes (Signed)
Blood transfusion complete. pnt tol well no s/sx of reaction. Pnt sitting up in bed denies any pain or discomfort will continue to monitor and assess.

## 2017-06-26 NOTE — Progress Notes (Signed)
Patient refuses bed alarm at this time.

## 2017-06-26 NOTE — Progress Notes (Signed)
Patient refused bed and chair alarm. Patient educated on the purpose of alarms.

## 2017-06-26 NOTE — H&P (Signed)
[place] [facility] [place] NAME: Corey West    MR#:  [id number]  DATE OF BIRTH:  May 25, 1951  DATE OF ADMISSION:  06/26/2017  PRIMARY CARE PHYSICIAN: Madelyn Brunner, MD   REQUESTING/REFERRING PHYSICIAN: Dr. [name]  CHIEF COMPLAINT:  No chief complaint on file. Anemia, GI bleed.   HISTORY OF PRESENT ILLNESS:  Corey West  is a 66 y.o. male with a known history of Morbid obesity, anxiety, chronic kidney disease stage III, history of Barrett's esophagus, osteoarthritis, chronic lower extremity lymphedema, diabetes, history of liver cirrhosis, depression, history of esophageal varices, gastric polyps who presented to the hospital due to symptomatic anemia with a hemoglobin of 6.1. Patient was scheduled to get a upper GI endoscopy as an outpatient today and during preoperative blood work was noted to have a hemoglobin of 6.1. Patient does say that he has been having significant exertional dyspnea over the past few days, he also admits to some intermittent dizziness, but no nausea vomiting, syncope. He denies any acute chest pain. Denies any abdominal pain, he does have dark stools chronically due to the iron that he takes. Given his significant anemia he was directly admitted to the hospital for blood transfusion. GI will be consulted and plan for upper GI endoscopy tomorrow.  PAST MEDICAL HISTORY:   Past Medical History:  Diagnosis Date  . Anemia   . Anxiety    controlled;   . Arthritis   . AVM (arteriovenous malformation) of stomach, acquired with hemorrhage   . Barrett's esophagus   . Chronic kidney disease    renal infufficiency  . Cirrhosis ([place])   . Depression    controlled;   Marland Kitchen Diabetes mellitus without complication ([place])    not controlled, taking insulin but sugar continues to run high;   . Esophageal varices ([place])   . GERD (gastroesophageal reflux disease)   . History of hiatal hernia   . Hypertension    controlled well;   .  Polyp, stomach    with chronic blood loss  . Sleep apnea    does not wear a cpap, Medicare would not pay for it   . Venous stasis of both lower extremities    with cellulitis    PAST SURGICAL HISTORY:   Past Surgical History:  Procedure Laterality Date  . ESOPHAGOGASTRODUODENOSCOPY N/A 02/09/2015   Procedure: ESOPHAGOGASTRODUODENOSCOPY (EGD);  Surgeon: Manya Silvas, MD;  Location: [facility] ENDOSCOPY;  Service: Endoscopy;  Laterality: N/A;  . ESOPHAGOGASTRODUODENOSCOPY N/A 07/22/2015   Procedure: ESOPHAGOGASTRODUODENOSCOPY (EGD);  Surgeon: Manya Silvas, MD;  Location: [facility] ENDOSCOPY;  Service: Endoscopy;  Laterality: N/A;  . ESOPHAGOGASTRODUODENOSCOPY (EGD) WITH PROPOFOL N/A 07/20/2015   Procedure: ESOPHAGOGASTRODUODENOSCOPY (EGD) WITH PROPOFOL;  Surgeon: Manya Silvas, MD;  Location: [facility] ENDOSCOPY;  Service: Endoscopy;  Laterality: N/A;  . ESOPHAGOGASTRODUODENOSCOPY (EGD) WITH PROPOFOL N/A 09/16/2015   Procedure: ESOPHAGOGASTRODUODENOSCOPY (EGD) WITH PROPOFOL;  Surgeon: Manya Silvas, MD;  Location: [facility] ENDOSCOPY;  Service: Endoscopy;  Laterality: N/A;  . ESOPHAGOGASTRODUODENOSCOPY (EGD) WITH PROPOFOL N/A 03/16/2016   Procedure: ESOPHAGOGASTRODUODENOSCOPY (EGD) WITH PROPOFOL;  Surgeon: Manya Silvas, MD;  Location: [facility] ENDOSCOPY;  Service: Endoscopy;  Laterality: N/A;  . ESOPHAGOGASTRODUODENOSCOPY (EGD) WITH PROPOFOL N/A 09/14/2016   Procedure: ESOPHAGOGASTRODUODENOSCOPY (EGD) WITH PROPOFOL;  Surgeon: Manya Silvas, MD;  Location: [facility] ENDOSCOPY;  Service: Endoscopy;  Laterality: N/A;  . ESOPHAGOGASTRODUODENOSCOPY (EGD) WITH PROPOFOL N/A 11/05/2016   Procedure: ESOPHAGOGASTRODUODENOSCOPY (EGD) WITH PROPOFOL;  Surgeon: Manya Silvas, MD;  Location:  [facility] ENDOSCOPY;  Service: Endoscopy;  Laterality: N/A;  . ESOPHAGOGASTRODUODENOSCOPY (EGD) WITH PROPOFOL N/A 02/06/2017   Procedure: ESOPHAGOGASTRODUODENOSCOPY (EGD) WITH PROPOFOL;  Surgeon: Manya Silvas, MD;  Location: [facility]  ENDOSCOPY;  Service: Endoscopy;  Laterality: N/A;  . ESOPHAGOGASTRODUODENOSCOPY (EGD) WITH PROPOFOL N/A 04/24/2017   Procedure: ESOPHAGOGASTRODUODENOSCOPY (EGD) WITH PROPOFOL;  Surgeon: Manya Silvas, MD;  Location: [facility] ENDOSCOPY;  Service: Endoscopy;  Laterality: N/A;  . TONSILLECTOMY    . TONSILLECTOMY AND ADENOIDECTOMY    . ULNAR NERVE TRANSPOSITION    . UVULOPALATOPHARYNGOPLASTY      SOCIAL HISTORY:   Social History  Substance Use Topics  . Smoking status: Current Some Day Smoker    Types: Cigars  . Smokeless tobacco: Never Used     Comment: smoke 1 at night occasionally;   . Alcohol use No     Comment: stopped 12 years ago    FAMILY HISTORY:   Family History  Problem Relation Age of Onset  . Diabetes Other     DRUG ALLERGIES:  No Known Allergies  REVIEW OF SYSTEMS:   Review of Systems  Constitutional: Negative for fever and weight loss.  HENT: Negative for congestion, nosebleeds and tinnitus.   Eyes: Negative for blurred vision, double vision and redness.  Respiratory: Positive for shortness of breath. Negative for cough and hemoptysis.   Cardiovascular: Negative for chest pain, orthopnea, leg swelling and PND.  Gastrointestinal: Negative for abdominal pain, diarrhea, melena, nausea and vomiting.  Genitourinary: Negative for dysuria, hematuria and urgency.  Musculoskeletal: Negative for falls and joint pain.  Neurological: Positive for weakness. Negative for dizziness, tingling, sensory change, focal weakness, seizures and headaches.  Endo/Heme/Allergies: Negative for polydipsia. Does not bruise/bleed easily.  Psychiatric/Behavioral: Negative for depression and memory loss. The patient is not nervous/anxious.     MEDICATIONS AT HOME:   Prior to Admission medications   Medication Sig Start Date End Date Taking? Authorizing Provider  ammonium lactate (AMLACTIN) 12 % cream Apply topically 2 (two) times daily.   Yes [provider]  Ferrous Sulfate  140 (45 FE) MG TBCR Take 1 tablet by mouth daily.    Yes [provider]  furosemide (LASIX) 40 MG tablet Take 40 mg by mouth 2 (two) times daily.   Yes [provider]  insulin aspart protamine- aspart (NOVOLOG MIX 70/30) (70-30) 100 UNIT/ML injection Inject 0.5 mLs (50 Units total) into the skin 2 (two) times daily with a meal. Pt uses 84 units with breakfast and 62 units with supper. Patient taking differently: Inject 65-84 Units into the skin 2 (two) times daily with a meal. Pt uses 84 units with breakfast and 62 units with supper.  02/22/17  Yes Fritzi Mandes, MD  lactulose (CHRONULAC) 10 GM/15ML solution Take 40 g by mouth at bedtime.    Yes [provider]  LORazepam (ATIVAN) 0.5 MG tablet Take 0.5 mg by mouth every 8 (eight) hours as needed for anxiety.   Yes [provider]  metolazone (ZAROXOLYN) 2.5 MG tablet Take 2.5 mg by mouth daily.   Yes [provider]  rifaximin (XIFAXAN) 550 MG TABS tablet Take 550 mg by mouth 2 (two) times daily.   Yes [provider]  spironolactone (ALDACTONE) 25 MG tablet Take 25 mg by mouth daily.   Yes [provider]  sucralfate (CARAFATE) 1 g tablet Take 1 g by mouth 3 (three) times daily.    Yes [provider]  traMADol (ULTRAM) 50 MG tablet Take by mouth every  6 (six) hours as needed.    Yes [provider]      VITAL SIGNS:  Blood pressure (!) 151/59, pulse 64, temperature 97.7 F (36.5 C), temperature source Tympanic, resp. rate 16, height 5\' 7"  (1.702 m), weight (!) 140.6 kg (310 lb), SpO2 100 %.  PHYSICAL EXAMINATION:  Physical Exam  GENERAL:  67 y.o.-year-old morbidly obese patient lying in bed in no acute distress.  EYES: Pupils equal, round, reactive to light and accommodation. No scleral icterus. Extraocular muscles intact.  HEENT: Head atraumatic, normocephalic. Oropharynx and nasopharynx clear. No oropharyngeal erythema, moist oral mucosa  NECK:  Supple, no  jugular venous distention. No thyroid enlargement, no tenderness.  LUNGS: Normal breath sounds bilaterally, no wheezing, rales, rhonchi. No use of accessory muscles of respiration.  CARDIOVASCULAR: S1, S2 RRR. No murmurs, rubs, gallops, clicks.  ABDOMEN: Soft, nontender, nondistended. Bowel sounds present. No organomegaly or mass.  EXTREMITIES: +2-3 dependent edema b/l, NO cyanosis, or clubbing. RLE with UNNA boot in place.   NEUROLOGIC: Cranial nerves II through XII are intact. No focal Motor or sensory deficits appreciated b/l PSYCHIATRIC: The patient is alert and oriented x 3. Good affect.  SKIN: No obvious rash, lesion, or ulcer.   LABORATORY PANEL:   CBC  Recent Labs Lab 06/26/17 0845  WBC 5.1  HGB 6.1*  HCT 17.9*  PLT 116*   ------------------------------------------------------------------------------------------------------------------  Chemistries  No results for input(s): NA, K, CL, CO2, GLUCOSE, BUN, CREATININE, CALCIUM, MG, AST, ALT, ALKPHOS, BILITOT in the last 168 hours.  Invalid input(s): GFRCGP ------------------------------------------------------------------------------------------------------------------  Cardiac Enzymes No results for input(s): TROPONINI in the last 168 hours. ------------------------------------------------------------------------------------------------------------------  RADIOLOGY:  No results found.   IMPRESSION AND PLAN:   66 year old male with a known history of Morbid obesity, anxiety, chronic kidney disease stage III, history of Barrett's esophagus, osteoarthritis, chronic lower extremity lymphedema, diabetes, history of liver cirrhosis, depression, history of esophageal varices, gastric polyps who presented to the hospital due to symptomatic anemia with a hemoglobin of 6.1.  1. Symptomatic anemia-this is secondary to GI bleed. Patient has a previous history of gastric polyps and aVF. Blood in the past. He was scheduled for upper  GI endoscopy but preoperatively noted to have a hemoglobin of 6.1. -We'll transfuse 2 units of packed red blood cells, follow serial hemoglobin. GI has been consulted. Plan for upper GI endoscopy tomorrow.  2. GI bleed-this cause patient symptomatically anemia as mentioned. -GI has been consulted, plan proper GI endoscopy tomorrow. Place on clear liquid diet, nothing by mouth after midnight.  3. Chronic lower extremity edema with right lower extremity wounds-patient has an Unna boot on the right lower extremity. Follows up with the wound clinic. We'll consult wound team for local wound care.  4. DM Type II - pt. Only on clear liquid diet. Hold his scheduled insulin.  - will place on SSI.   5. History of liver cirrhosis-no evidence of hepatic encephalopathy. Continue Xifaxan. Hold lactulose for now.  6. History of chronic lower extremity edema-continue Lasix, Aldactone, metolazone.  7. Anxiety-continue Xanax as needed.    All the records are reviewed and case discussed with ED provider. Management plans discussed with the patient, family and they are in agreement.  CODE STATUS: Full code  TOTAL TIME TAKING CARE OF THIS PATIENT: 45 minutes.    Henreitta Leber M.D on 06/26/2017 at 1:25 PM  Between 7am to 6pm - Pager - [phone]  After 6pm go to [email or web address] - password [email or web address] [facility] Hospital[facility]  Office  [phone]  CC: Primary care physician; Madelyn Brunner, MD

## 2017-06-27 ENCOUNTER — Encounter
Admission: RE | Disposition: A | Discharge status: Home / Self Care | Payer: Self-pay | Source: Home / Self Care | Attending: Internal Medicine

## 2017-06-27 ENCOUNTER — Encounter: Payer: Self-pay | Admitting: Unknown Physician Specialty

## 2017-06-27 ENCOUNTER — Observation Stay: Payer: Medicare Other | Admitting: Anesthesiology

## 2017-06-27 ENCOUNTER — Encounter: Payer: Medicare Other | Admitting: Occupational Therapy

## 2017-06-27 DIAGNOSIS — Z794 Long term (current) use of insulin: Secondary | ICD-10-CM | POA: Diagnosis not present

## 2017-06-27 DIAGNOSIS — K7460 Unspecified cirrhosis of liver: Secondary | ICD-10-CM | POA: Diagnosis present

## 2017-06-27 DIAGNOSIS — K31811 Angiodysplasia of stomach and duodenum with bleeding: Secondary | ICD-10-CM | POA: Diagnosis present

## 2017-06-27 DIAGNOSIS — Z23 Encounter for immunization: Secondary | ICD-10-CM | POA: Diagnosis present

## 2017-06-27 DIAGNOSIS — D62 Acute posthemorrhagic anemia: Secondary | ICD-10-CM | POA: Diagnosis present

## 2017-06-27 DIAGNOSIS — F17290 Nicotine dependence, other tobacco product, uncomplicated: Secondary | ICD-10-CM | POA: Diagnosis present

## 2017-06-27 DIAGNOSIS — I878 Other specified disorders of veins: Secondary | ICD-10-CM | POA: Diagnosis present

## 2017-06-27 DIAGNOSIS — Z6841 Body Mass Index (BMI) 40.0 and over, adult: Secondary | ICD-10-CM | POA: Diagnosis not present

## 2017-06-27 DIAGNOSIS — K2971 Gastritis, unspecified, with bleeding: Secondary | ICD-10-CM | POA: Diagnosis present

## 2017-06-27 DIAGNOSIS — N183 Chronic kidney disease, stage 3 (moderate): Secondary | ICD-10-CM | POA: Diagnosis present

## 2017-06-27 DIAGNOSIS — G4730 Sleep apnea, unspecified: Secondary | ICD-10-CM | POA: Diagnosis present

## 2017-06-27 DIAGNOSIS — F329 Major depressive disorder, single episode, unspecified: Secondary | ICD-10-CM | POA: Diagnosis present

## 2017-06-27 DIAGNOSIS — I129 Hypertensive chronic kidney disease with stage 1 through stage 4 chronic kidney disease, or unspecified chronic kidney disease: Secondary | ICD-10-CM | POA: Diagnosis present

## 2017-06-27 DIAGNOSIS — K219 Gastro-esophageal reflux disease without esophagitis: Secondary | ICD-10-CM | POA: Diagnosis present

## 2017-06-27 DIAGNOSIS — F419 Anxiety disorder, unspecified: Secondary | ICD-10-CM | POA: Diagnosis present

## 2017-06-27 DIAGNOSIS — Z79899 Other long term (current) drug therapy: Secondary | ICD-10-CM | POA: Diagnosis not present

## 2017-06-27 DIAGNOSIS — M1991 Primary osteoarthritis, unspecified site: Secondary | ICD-10-CM | POA: Diagnosis present

## 2017-06-27 DIAGNOSIS — D649 Anemia, unspecified: Secondary | ICD-10-CM | POA: Diagnosis present

## 2017-06-27 DIAGNOSIS — K2270 Barrett's esophagus without dysplasia: Secondary | ICD-10-CM | POA: Diagnosis present

## 2017-06-27 DIAGNOSIS — E1122 Type 2 diabetes mellitus with diabetic chronic kidney disease: Secondary | ICD-10-CM | POA: Diagnosis present

## 2017-06-27 DIAGNOSIS — R600 Localized edema: Secondary | ICD-10-CM | POA: Diagnosis present

## 2017-06-27 HISTORY — PX: ESOPHAGOGASTRODUODENOSCOPY: SHX5428

## 2017-06-27 LAB — TYPE AND SCREEN
ABO/RH(D): A NEG
Antibody Screen: NEGATIVE
UNIT DIVISION: 0
UNIT DIVISION: 0

## 2017-06-27 LAB — BASIC METABOLIC PANEL
ANION GAP: 7 (ref 5–15)
BUN: 20 mg/dL (ref 6–20)
CHLORIDE: 104 mmol/L (ref 101–111)
CO2: 26 mmol/L (ref 22–32)
Calcium: 8.4 mg/dL — ABNORMAL LOW (ref 8.9–10.3)
Creatinine, Ser: 1.85 mg/dL — ABNORMAL HIGH (ref 0.61–1.24)
GFR, EST AFRICAN AMERICAN: 42 mL/min — AB (ref >60)
GFR, EST NON AFRICAN AMERICAN: 37 mL/min — AB (ref >60)
Glucose, Bld: 175 mg/dL — ABNORMAL HIGH (ref 65–99)
POTASSIUM: 3.4 mmol/L — AB (ref 3.5–5.1)
SODIUM: 137 mmol/L (ref 135–145)

## 2017-06-27 LAB — CBC
HEMATOCRIT: 25 % — AB (ref 40.0–52.0)
HEMOGLOBIN: 8.5 g/dL — AB (ref 13.0–18.0)
MCH: 29.4 pg (ref 26.0–34.0)
MCHC: 34.1 g/dL (ref 32.0–36.0)
MCV: 86.2 fL (ref 80.0–100.0)
Platelets: 136 10*3/uL — ABNORMAL LOW (ref 150–440)
RBC: 2.9 MIL/uL — ABNORMAL LOW (ref 4.40–5.90)
RDW: 15.6 % — AB (ref 11.5–14.5)
WBC: 5.8 10*3/uL (ref 3.8–10.6)

## 2017-06-27 LAB — BPAM RBC
Blood Product Expiration Date: 201810082359
Blood Product Expiration Date: 201810092359
ISSUE DATE / TIME: 201809261444
ISSUE DATE / TIME: 201809261804
UNIT TYPE AND RH: 600
Unit Type and Rh: 600

## 2017-06-27 LAB — GLUCOSE, CAPILLARY
GLUCOSE-CAPILLARY: 176 mg/dL — AB (ref 65–99)
GLUCOSE-CAPILLARY: 172 mg/dL — AB (ref 65–99)
Glucose-Capillary: 314 mg/dL — ABNORMAL HIGH (ref 65–99)
Glucose-Capillary: 247 mg/dL — ABNORMAL HIGH (ref 65–99)

## 2017-06-27 LAB — HEMOGLOBIN: Hemoglobin: 8.5 g/dL — ABNORMAL LOW (ref 13.0–18.0)

## 2017-06-27 SURGERY — EGD (ESOPHAGOGASTRODUODENOSCOPY)
Anesthesia: General

## 2017-06-27 MED ORDER — PROPOFOL 10 MG/ML IV BOLUS
INTRAVENOUS | Status: AC
  Filled 2017-06-27: qty 20

## 2017-06-27 MED ORDER — PROPOFOL 10 MG/ML IV BOLUS
INTRAVENOUS | Status: DC | PRN
  Administered 2017-06-27: 30 mg via INTRAVENOUS

## 2017-06-27 MED ORDER — SODIUM CHLORIDE 0.9 % IV SOLN
8.0000 mg/h | INTRAVENOUS | Status: DC
  Administered 2017-06-27 – 2017-06-28 (×2): 8 mg/h via INTRAVENOUS
  Filled 2017-06-27 (×2): qty 80

## 2017-06-27 MED ORDER — MIDAZOLAM HCL 2 MG/2ML IJ SOLN
INTRAMUSCULAR | Status: AC
  Filled 2017-06-27: qty 2

## 2017-06-27 MED ORDER — PANTOPRAZOLE SODIUM 40 MG IV SOLR: 40.0000 mg | Freq: Two times a day (BID) | INTRAVENOUS | Status: DC

## 2017-06-27 MED ORDER — FENTANYL CITRATE (PF) 100 MCG/2ML IJ SOLN
INTRAMUSCULAR | Status: DC | PRN
  Administered 2017-06-27 (×2): 50 ug via INTRAVENOUS

## 2017-06-27 MED ORDER — FENTANYL CITRATE (PF) 100 MCG/2ML IJ SOLN
INTRAMUSCULAR | Status: AC
  Filled 2017-06-27: qty 2

## 2017-06-27 MED ORDER — INSULIN ASPART PROT & ASPART (70-30 MIX) 100 UNIT/ML ~~LOC~~ SUSP
20.0000 [IU] | Freq: Two times a day (BID) | SUBCUTANEOUS | Status: DC
  Administered 2017-06-27: 21:00:00 20 [IU] via SUBCUTANEOUS
  Filled 2017-06-27: qty 10

## 2017-06-27 MED ORDER — PROPOFOL 500 MG/50ML IV EMUL
INTRAVENOUS | Status: DC | PRN
  Administered 2017-06-27: 120 ug/kg/min via INTRAVENOUS

## 2017-06-27 MED ORDER — MIDAZOLAM HCL 2 MG/2ML IJ SOLN
INTRAMUSCULAR | Status: DC | PRN
  Administered 2017-06-27 (×2): 1 mg via INTRAVENOUS

## 2017-06-27 MED ORDER — PHENOL 1.4 % MT LIQD
1.0000 | OROMUCOSAL | Status: DC | PRN
  Filled 2017-06-27: qty 177

## 2017-06-27 NOTE — Consult Note (Signed)
[facility] Nurse wound consult note Reason for Consult: Venous stasis ulcers, BLE edemam, cellulitis Wound type:venous insufficiency  Pressure Injury POA: NA Measurement:na Wound bed:na Drainage (amount, consistency, odor) na Periwound: legs with errythema, few pretibial partial thickness wounds on right leg Dressing procedure/placement/frequency: Pt goes to the [facility] for Unna's Boots and has an appt Tuesday.  Pt had Unna's boot on when he arrived, someone removed and replaced but did not start low enough. I removed that one and  I placed bilateral Unna's Boots at pt's request, as he plans on being discharged and making his appt at the [facility] on Tuesday, 5 days from now. We will not follow, but will remain available to this patient, to nursing, and the medical and/or surgical teams.  Please re-consult if we need to assist further.   Fara Olden, RN-C, WTA-C Wound Treatment Associate

## 2017-06-27 NOTE — Progress Notes (Addendum)
[place] [facility] [place] NAME: Corey West    MR#:  [id number]  DATE OF BIRTH:  [birth year]  SUBJECTIVE:  CHIEF COMPLAINT:  No chief complaint on file.  No  BM in the hospital. EGD showed recent bleeding with barretts and gastritis Started on clears after egd  REVIEW OF SYSTEMS:    Review of Systems  Constitutional: Positive for malaise/fatigue. Negative for chills and fever.  HENT: Negative for sore throat.   Eyes: Negative for blurred vision, double vision and pain.  Respiratory: Negative for cough, hemoptysis, shortness of breath and wheezing.   Cardiovascular: Positive for leg swelling. Negative for chest pain, palpitations and orthopnea.  Gastrointestinal: Negative for abdominal pain, constipation, diarrhea, heartburn, nausea and vomiting.  Genitourinary: Negative for dysuria and hematuria.  Musculoskeletal: Negative for back pain and joint pain.  Skin: Negative for rash.  Neurological: Positive for weakness. Negative for sensory change, speech change, focal weakness and headaches.  Endo/Heme/Allergies: Does not bruise/bleed easily.  Psychiatric/Behavioral: Negative for depression. The patient is not nervous/anxious.     DRUG ALLERGIES:  No Known Allergies  VITALS:  Blood pressure (!) 132/48, pulse 70, temperature 98.4 F (36.9 C), temperature source Oral, resp. rate 16, height 5\' 7"  (1.702 m), weight (!) 140.6 kg (310 lb), SpO2 100 %.  PHYSICAL EXAMINATION:   Physical Exam  GENERAL:  66 y.o.-year-old patient lying in the bed with no acute distress. Morbidly obese EYES: Pupils equal, round, reactive to light and accommodation. No scleral icterus. Extraocular muscles intact.  HEENT: Head atraumatic, normocephalic. Oropharynx and nasopharynx clear.  NECK:  Supple, no jugular venous distention. No thyroid enlargement, no tenderness.  LUNGS: Normal breath sounds bilaterally, no wheezing, rales, rhonchi. No use of accessory muscles of  respiration.  CARDIOVASCULAR: S1, S2 normal. No murmurs, rubs, or gallops.  ABDOMEN: Soft, nontender, nondistended. Bowel sounds present. No organomegaly or mass.  EXTREMITIES: No cyanosis, clubbing . B/l LE edema with statis changes NEUROLOGIC: Cranial nerves II through XII are intact. No focal Motor or sensory deficits b/l.   PSYCHIATRIC: The patient is alert and oriented x 3.  SKIN: No obvious rash, lesion, or ulcer.   LABORATORY PANEL:   CBC  Recent Labs Lab 06/27/17 0601  WBC 5.8  HGB 8.5*  HCT 25.0*  PLT 136*   ------------------------------------------------------------------------------------------------------------------ Chemistries   Recent Labs Lab 06/27/17 0601  NA 137  K 3.4*  CL 104  CO2 26  GLUCOSE 175*  BUN 20  CREATININE 1.85*  CALCIUM 8.4*   ------------------------------------------------------------------------------------------------------------------  Cardiac Enzymes No results for input(s): TROPONINI in the last 168 hours. ------------------------------------------------------------------------------------------------------------------  RADIOLOGY:  No results found.   ASSESSMENT AND PLAN:   66 year old male with a known history of Morbid obesity, anxiety, chronic kidney disease stage III, history of Barrett's esophagus, osteoarthritis, chronic lower extremity lymphedema, diabetes, history of liver cirrhosis, depression, history of esophageal varices, gastric polyps who presented to the hospital due to symptomatic anemia with a hemoglobin of 6.1.  * Upper GI bleed with acute blood loss anemia EGD showed barretts esophagus and gastritis with recent bleeding. Cauterized. Now on clears Discussed with Dr. Vira Agar. EGD again tomorrow morning Check Hb now and again in AM Transfuse PRN Start protonix drip  3. Chronic lower extremity edema with right lower extremity wounds-patient has an Haematologist on the right lower extremity. Follows up with  the wound clinic. We'll consult wound team for local wound care.  4. DM Type II - pt. Only on  clear liquid diet. Start insulin 70/30 20 units BID SSI  5. History of liver cirrhosis-no evidence of hepatic encephalopathy. Continue Xifaxan. Hold lactulose for now.  6. History of chronic lower extremity edema-continue Lasix, Aldactone, metolazone.  7. Anxiety-continue Xanax as needed.  8. CKD 3- stable   All the records are reviewed and case discussed with Care Management/Social Workerr. Management plans discussed with the patient, family and they are in agreement.  CODE STATUS: FULL CODE  DVT Prophylaxis: SCDs  TOTAL TIME TAKING CARE OF THIS PATIENT: 35 minutes.   POSSIBLE D/C IN 1-2 DAYS, DEPENDING ON CLINICAL CONDITION.  Hillary Bow R M.D on 06/27/2017 at 5:31 PM  Between 7am to 6pm - Pager - [phone]  After 6pm go to [email or web address] - password [email or web address] [place] Hospital[facility]  Office  [phone]  CC: Primary care physician; Madelyn Brunner, MD  Note: This dictation was prepared with Dragon dictation along with smaller phrase technology. Any transcriptional errors that result from this process are unintentional.

## 2017-06-27 NOTE — Anesthesia Post-op Follow-up Note (Signed)
Anesthesia QCDR form completed.        

## 2017-06-27 NOTE — Anesthesia Preprocedure Evaluation (Addendum)
Anesthesia Evaluation  Patient identified by MRN, date of birth, ID band Patient awake    Reviewed: Allergy & Precautions  Airway Mallampati: II       Dental  (+) Teeth Intact   Pulmonary sleep apnea , COPD, Current Smoker,     + decreased breath sounds      Cardiovascular Exercise Tolerance: Poor hypertension, Pt. on medications + Peripheral Vascular Disease  + dysrhythmias Atrial Fibrillation  Rhythm:Irregular     Neuro/Psych Anxiety Depression    GI/Hepatic Neg liver ROS, hiatal hernia, GERD  Medicated,(+) Cirrhosis       ,   Endo/Other  diabetes, Type 1, Insulin DependentMorbid obesity  Renal/GU Renal InsufficiencyRenal disease     Musculoskeletal   Abdominal (+) + obese,   Peds negative pediatric ROS (+)  Hematology  (+) anemia ,   Anesthesia Other Findings   Reproductive/Obstetrics                            Anesthesia Physical Anesthesia Plan  ASA: IV  Anesthesia Plan:    Post-op Pain Management:    Induction: Intravenous  PONV Risk Score and Plan: 0  Airway Management Planned: Natural Airway and Nasal Cannula  Additional Equipment:   Intra-op Plan:   Post-operative Plan:   Informed Consent: I have reviewed the patients History and Physical, chart, labs and discussed the procedure including the risks, benefits and alternatives for the proposed anesthesia with the patient or authorized representative who has indicated his/her understanding and acceptance.     Plan Discussed with: Surgeon  Anesthesia Plan Comments:         Anesthesia Quick Evaluation

## 2017-06-27 NOTE — Discharge Instructions (Signed)
Resume diet and activity as before ° ° °

## 2017-06-27 NOTE — Op Note (Signed)
[facility] Gastroenterology Patient Name: Corey West Procedure Date: 06/27/2017 12:48 PM MRN: [id number] Account #: [id number] Date of Birth: 1951/01/03 Admit Type: Inpatient Age: 66 Room: [facility] ENDO ROOM 3 Gender: Male Note Status: Finalized Procedure:            Upper GI endoscopy Indications:          Melena, Suspected upper gastrointestinal bleeding Providers:            Manya Silvas, MD Referring MD:         No Local Md, MD (Referring MD) Medicines:            Propofol per Anesthesia Complications:        No immediate complications. Procedure:            Pre-Anesthesia Assessment:                       - After reviewing the risks and benefits, the patient                        was deemed in satisfactory condition to undergo the                        procedure.                       After obtaining informed consent, the endoscope was                        passed under direct vision. Throughout the procedure,                        the patient's blood pressure, pulse, and oxygen                        saturations were monitored continuously. The Endoscope                        was introduced through the mouth, and advanced to the                        prepyloric region, stomach. The upper GI endoscopy was                        accomplished without difficulty. The patient tolerated                        the procedure well. Findings:      Blood in stomach, relatively fresh.      Diffuse and localized moderate inflammation characterized by adherent       blood, erosions, friability and granularity was found in the gastric       antrum. Impression:           - Barrett's esophagus. probably 3cm in length.                       - Gastritis. Severe, Treated with argon plasma                        coagulation (APC). Blood was present in antrum and  washed out before APC done to most of the antrum.                       - No  specimens collected. Recommendation:       - The findings and recommendations were discussed with                        the patient's family. Clear liquids today and tomorrow.                        Consider repeat procedure m0 Manya Silvas, MD 06/27/2017 1:25:31 PM This report has been signed electronically. Number of Addenda: 0 Note Initiated On: 06/27/2017 12:48 PM      [facility]

## 2017-06-27 NOTE — Transfer of Care (Signed)
Immediate Anesthesia Transfer of Care Note  Patient: [name]  Procedure(s) Performed: Procedure(s): ESOPHAGOGASTRODUODENOSCOPY (EGD) (N/A)  Patient Location: PACU  Anesthesia Type:General  Level of Consciousness: awake and alert   Airway & Oxygen Therapy: Patient Spontanous Breathing and Patient connected to nasal cannula oxygen  Post-op Assessment: Report given to RN and Post -op Vital signs reviewed and stable  Post vital signs: Reviewed  Last Vitals:  Vitals:   06/27/17 0954 06/27/17 1235  BP: (!) 142/47 (!) 140/49  Pulse:  70  Resp:  17  Temp:    SpO2:  100%    Last Pain:  Vitals:   06/27/17 0900  TempSrc: Oral  PainSc:          Complications: No apparent anesthesia complications

## 2017-06-27 NOTE — Progress Notes (Signed)
Medications administered by student RN [id number] with supervision of Clinical Instructor Oluwateniola Leitch MSN, RN-BC or patient's assigned RN.   

## 2017-06-28 ENCOUNTER — Inpatient Hospital Stay: Payer: Medicare Other | Admitting: Anesthesiology

## 2017-06-28 ENCOUNTER — Encounter: Payer: Self-pay | Admitting: Unknown Physician Specialty

## 2017-06-28 ENCOUNTER — Encounter
Admission: RE | Disposition: A | Discharge status: Home / Self Care | Payer: Self-pay | Source: Home / Self Care | Attending: Internal Medicine

## 2017-06-28 HISTORY — PX: ESOPHAGOGASTRODUODENOSCOPY: SHX5428

## 2017-06-28 LAB — HEMOGLOBIN: HEMOGLOBIN: 8 g/dL — AB (ref 13.0–18.0)

## 2017-06-28 LAB — GLUCOSE, CAPILLARY: GLUCOSE-CAPILLARY: 138 mg/dL — AB (ref 65–99)

## 2017-06-28 SURGERY — EGD (ESOPHAGOGASTRODUODENOSCOPY)
Anesthesia: General

## 2017-06-28 MED ORDER — GLYCOPYRROLATE 0.2 MG/ML IJ SOLN
INTRAMUSCULAR | Status: AC
  Filled 2017-06-28: qty 1

## 2017-06-28 MED ORDER — FENTANYL CITRATE (PF) 100 MCG/2ML IJ SOLN
INTRAMUSCULAR | Status: DC | PRN
  Administered 2017-06-28 (×2): 50 ug via INTRAVENOUS

## 2017-06-28 MED ORDER — PROPOFOL 500 MG/50ML IV EMUL
INTRAVENOUS | Status: DC | PRN
  Administered 2017-06-28: 100 ug/kg/min via INTRAVENOUS

## 2017-06-28 MED ORDER — MIDAZOLAM HCL 5 MG/5ML IJ SOLN
INTRAMUSCULAR | Status: DC | PRN
  Administered 2017-06-28: 2 mg via INTRAVENOUS

## 2017-06-28 MED ORDER — PROPOFOL 500 MG/50ML IV EMUL
INTRAVENOUS | Status: AC
  Filled 2017-06-28: qty 50

## 2017-06-28 MED ORDER — SODIUM CHLORIDE 0.9 % IV SOLN
INTRAVENOUS | Status: DC | PRN
  Administered 2017-06-28: 12:00:00 via INTRAVENOUS

## 2017-06-28 MED ORDER — LIDOCAINE HCL (PF) 2 % IJ SOLN
INTRAMUSCULAR | Status: DC | PRN
  Administered 2017-06-28: 80 mg

## 2017-06-28 MED ORDER — PROPOFOL 10 MG/ML IV BOLUS
INTRAVENOUS | Status: DC | PRN
  Administered 2017-06-28: 50 mg via INTRAVENOUS

## 2017-06-28 MED ORDER — FENTANYL CITRATE (PF) 100 MCG/2ML IJ SOLN
INTRAMUSCULAR | Status: AC
  Filled 2017-06-28: qty 2

## 2017-06-28 MED ORDER — LIDOCAINE HCL (PF) 2 % IJ SOLN
INTRAMUSCULAR | Status: AC
  Filled 2017-06-28: qty 4

## 2017-06-28 MED ORDER — MIDAZOLAM HCL 2 MG/2ML IJ SOLN
INTRAMUSCULAR | Status: AC
  Filled 2017-06-28: qty 2

## 2017-06-28 NOTE — Anesthesia Postprocedure Evaluation (Signed)
Anesthesia Post Note  Patient: Corey West  Procedure(s) Performed: Procedure(s) (LRB): ESOPHAGOGASTRODUODENOSCOPY (EGD) (N/A)  Patient location during evaluation: Endoscopy Anesthesia Type: General Level of consciousness: awake and alert Pain management: pain level controlled Vital Signs Assessment: post-procedure vital signs reviewed and stable Respiratory status: spontaneous breathing, nonlabored ventilation, respiratory function stable and patient connected to nasal cannula oxygen Cardiovascular status: blood pressure returned to baseline and stable Postop Assessment: no apparent nausea or vomiting Anesthetic complications: no     Last Vitals:  Vitals:   06/28/17 1228 06/28/17 1238  BP: (!) 140/57 (!) 148/64  Pulse: 78 78  Resp: 17 16  Temp:    SpO2: 100% 100%    Last Pain:  Vitals:   06/28/17 1218  TempSrc:   PainSc: Asleep                 Precious Haws [place]

## 2017-06-28 NOTE — Op Note (Signed)
[facility] Gastroenterology Patient Name: Corey West Procedure Date: 06/28/2017 11:43 AM MRN: [id number] Account #: [id number] Date of Birth: 1951-02-09 Admit Type: Inpatient Age: 66 Room: [facility] ENDO ROOM 3 Gender: Male Note Status: Finalized Procedure:            Upper GI endoscopy Indications:          Melena, Suspected upper gastrointestinal bleeding in                        patient with chronic blood loss Providers:            Manya Silvas, MD Medicines:            Propofol per Anesthesia Complications:        No immediate complications. Procedure:            Pre-Anesthesia Assessment:                       - After reviewing the risks and benefits, the patient                        was deemed in satisfactory condition to undergo the                        procedure.                       After obtaining informed consent, the endoscope was                        passed under direct vision. Throughout the procedure,                        the patient's blood pressure, pulse, and oxygen                        saturations were monitored continuously. The Endoscope                        was introduced through the mouth, and advanced to the                        antrum of the stomach. The upper GI endoscopy was                        accomplished without difficulty. The patient tolerated                        the procedure well. Findings:      There were esophageal mucosal changes secondary to established       short-segment Barrett's disease present in the lower third of the       esophagus. The maximum longitudinal extent of these mucosal changes was       3 cm in length.      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 No as bad as yesterday. Coagulation for       tissue destruction using argon plasma at 0.3 liters/minute and 20 watts       was successful.      The entire body of the  stomach was covered in AVM/and previous argon       treatment done yesterday. Impression:           - Esophageal mucosal changes secondary to established                        short-segment Barrett's disease.                       - Gastric antral vascular ectasia with bleeding.                        Treated with argon plasma coagulation (APC).                       - No specimens collected. Recommendation:       Clear liquid diet today, full liquid diet starting                        tomorrow, soft diet Sunday. OK to discharge home today.                        Follow up in 2 weeks.                       - The findings and recommendations were discussed with                        the patient's family. Manya Silvas, MD 06/28/2017 12:10:10 PM This report has been signed electronically. Number of Addenda: 0 Note Initiated On: 06/28/2017 11:43 AM      [facility]

## 2017-06-28 NOTE — Progress Notes (Addendum)
Pt has been NPO since midnight. Spoke with Dr Tiffany Kocher and pt is to remain NPO for procedure today.  Pt is aware nothing by mouth.  Dorna Bloom RN

## 2017-06-28 NOTE — Anesthesia Preprocedure Evaluation (Signed)
Anesthesia Evaluation  Patient identified by MRN, date of birth, ID band Patient awake    Reviewed: Allergy & Precautions, H&P , NPO status , Patient's Chart, lab work & pertinent test results  History of Anesthesia Complications Negative for: history of anesthetic complications  Airway Mallampati: II  TM Distance: <3 FB Neck ROM: limited    Dental  (+) Chipped, Missing, Poor Dentition   Pulmonary neg shortness of breath, sleep apnea , COPD, Current Smoker,     + decreased breath sounds      Cardiovascular Exercise Tolerance: Poor hypertension, Pt. on medications + Peripheral Vascular Disease  + dysrhythmias Atrial Fibrillation  Rhythm:Irregular     Neuro/Psych PSYCHIATRIC DISORDERS Anxiety Depression negative neurological ROS  negative psych ROS   GI/Hepatic hiatal hernia, GERD  Medicated and Controlled,(+) Cirrhosis       ,   Endo/Other  diabetes, Type 1, Insulin DependentMorbid obesity  Renal/GU Renal InsufficiencyRenal disease  negative genitourinary   Musculoskeletal   Abdominal (+) + obese,   Peds negative pediatric ROS (+)  Hematology negative hematology ROS (+) anemia ,   Anesthesia Other Findings Past Medical History: No date: Anemia No date: Anxiety     Comment:  controlled;  No date: Arthritis No date: AVM (arteriovenous malformation) of stomach, acquired with  hemorrhage No date: Barrett's esophagus No date: Chronic kidney disease     Comment:  renal infufficiency No date: Cirrhosis ([place]) No date: Depression     Comment:  controlled;  No date: Diabetes mellitus without complication (HCC)     Comment:  not controlled, taking insulin but sugar continues to               run high;  No date: Esophageal varices (HCC) No date: GERD (gastroesophageal reflux disease) No date: History of hiatal hernia No date: Hypertension     Comment:  controlled well;  No date: Polyp, stomach     Comment:   with chronic blood loss No date: Sleep apnea     Comment:  does not wear a cpap, Medicare would not pay for it  No date: Venous stasis of both lower extremities     Comment:  with cellulitis  Past Surgical History: 02/09/2015: ESOPHAGOGASTRODUODENOSCOPY; N/A     Comment:  Procedure: ESOPHAGOGASTRODUODENOSCOPY (EGD);  Surgeon:               Manya Silvas, MD;  Location: [facility] ENDOSCOPY;                Service: Endoscopy;  Laterality: N/A; 07/22/2015: ESOPHAGOGASTRODUODENOSCOPY; N/A     Comment:  Procedure: ESOPHAGOGASTRODUODENOSCOPY (EGD);  Surgeon:               Manya Silvas, MD;  Location: [facility] ENDOSCOPY;                Service: Endoscopy;  Laterality: N/A; 06/26/2017: ESOPHAGOGASTRODUODENOSCOPY; N/A     Comment:  Procedure: ESOPHAGOGASTRODUODENOSCOPY (EGD);  Surgeon:               Manya Silvas, MD;  Location: [facility] ENDOSCOPY;                Service: Endoscopy;  Laterality: N/A; 06/27/2017: ESOPHAGOGASTRODUODENOSCOPY; N/A     Comment:  Procedure: ESOPHAGOGASTRODUODENOSCOPY (EGD);  Surgeon:               Manya Silvas, MD;  Location: [facility] ENDOSCOPY;                Service:  Endoscopy;  Laterality: N/A; 07/20/2015: ESOPHAGOGASTRODUODENOSCOPY (EGD) WITH PROPOFOL; N/A     Comment:  Procedure: ESOPHAGOGASTRODUODENOSCOPY (EGD) WITH               PROPOFOL;  Surgeon: Manya Silvas, MD;  Location: [facility]              ENDOSCOPY;  Service: Endoscopy;  Laterality: N/A; 09/16/2015: ESOPHAGOGASTRODUODENOSCOPY (EGD) WITH PROPOFOL; N/A     Comment:  Procedure: ESOPHAGOGASTRODUODENOSCOPY (EGD) WITH               PROPOFOL;  Surgeon: Manya Silvas, MD;  Location: [facility]              ENDOSCOPY;  Service: Endoscopy;  Laterality: N/A; 03/16/2016: ESOPHAGOGASTRODUODENOSCOPY (EGD) WITH PROPOFOL; N/A     Comment:  Procedure: ESOPHAGOGASTRODUODENOSCOPY (EGD) WITH               PROPOFOL;  Surgeon: Manya Silvas, MD;  Location: [facility]              ENDOSCOPY;  Service: Endoscopy;  Laterality:  N/A; 09/14/2016: ESOPHAGOGASTRODUODENOSCOPY (EGD) WITH PROPOFOL; N/A     Comment:  Procedure: ESOPHAGOGASTRODUODENOSCOPY (EGD) WITH               PROPOFOL;  Surgeon: Manya Silvas, MD;  Location: [facility]              ENDOSCOPY;  Service: Endoscopy;  Laterality: N/A; 11/05/2016: ESOPHAGOGASTRODUODENOSCOPY (EGD) WITH PROPOFOL; N/A     Comment:  Procedure: ESOPHAGOGASTRODUODENOSCOPY (EGD) WITH               PROPOFOL;  Surgeon: Manya Silvas, MD;  Location: [facility]              ENDOSCOPY;  Service: Endoscopy;  Laterality: N/A; 02/06/2017: ESOPHAGOGASTRODUODENOSCOPY (EGD) WITH PROPOFOL; N/A     Comment:  Procedure: ESOPHAGOGASTRODUODENOSCOPY (EGD) WITH               PROPOFOL;  Surgeon: Manya Silvas, MD;  Location:               [facility] ENDOSCOPY;  Service: Endoscopy;  Laterality: N/A; 04/24/2017: ESOPHAGOGASTRODUODENOSCOPY (EGD) WITH PROPOFOL; N/A     Comment:  Procedure: ESOPHAGOGASTRODUODENOSCOPY (EGD) WITH               PROPOFOL;  Surgeon: Manya Silvas, MD;  Location:               [facility] ENDOSCOPY;  Service: Endoscopy;  Laterality: N/A; No date: TONSILLECTOMY No date: TONSILLECTOMY AND ADENOIDECTOMY No date: ULNAR NERVE TRANSPOSITION No date: UVULOPALATOPHARYNGOPLASTY  BMI    Body Mass Index:  48.55 kg/m      Reproductive/Obstetrics negative OB ROS                             Anesthesia Physical  Anesthesia Plan  ASA: IV  Anesthesia Plan: General   Post-op Pain Management:    Induction: Intravenous  PONV Risk Score and Plan: 0 and Propofol infusion  Airway Management Planned: Natural Airway and Nasal Cannula  Additional Equipment:   Intra-op Plan:   Post-operative Plan:   Informed Consent: I have reviewed the patients History and Physical, chart, labs and discussed the procedure including the risks, benefits and alternatives for the proposed anesthesia with the patient or authorized representative who has indicated his/her understanding and  acceptance.   Dental Advisory Given  Plan Discussed with: Surgeon  Anesthesia Plan Comments: (Patient  consented for risks of anesthesia including but not limited to:  - adverse reactions to medications - risk of intubation if required - damage to teeth, lips or other oral mucosa - sore throat or hoarseness - Damage to heart, brain, lungs or loss of life  Patient voiced understanding.)        Anesthesia Quick Evaluation

## 2017-06-28 NOTE — Anesthesia Post-op Follow-up Note (Signed)
Anesthesia QCDR form completed.        

## 2017-06-28 NOTE — Progress Notes (Signed)
Received MD order to discharge patient to home, reviewed home meds, discharge instructions and follow up appointments with patent and he verbalized understanding

## 2017-06-28 NOTE — Transfer of Care (Signed)
Immediate Anesthesia Transfer of Care Note  Patient: Corey West  Procedure(s) Performed: Procedure(s): ESOPHAGOGASTRODUODENOSCOPY (EGD) (N/A)  Patient Location: PACU  Anesthesia Type:General  Level of Consciousness: sedated  Airway & Oxygen Therapy: Patient Spontanous Breathing and Patient connected to nasal cannula oxygen  Post-op Assessment: Report given to RN and Post -op Vital signs reviewed and stable  Post vital signs: Reviewed and stable  Last Vitals:  Vitals:   06/28/17 0508 06/28/17 1208  BP: (!) 121/50 (!) 95/37  Pulse: 67   Resp: 17   Temp: 36.6 C 36.7 C  SpO2: 97%     Last Pain:  Vitals:   06/28/17 1208  TempSrc: Tympanic  PainSc: Asleep         Complications: No apparent anesthesia complications

## 2017-06-28 NOTE — Consult Note (Signed)
Patient EGD show some improvement and I think he can go home on full liquid diet for few days while taking his medicine as prescribed.  Follow up in office in 3 weeks or so.

## 2017-07-01 ENCOUNTER — Encounter: Payer: Medicare Other | Admitting: Occupational Therapy

## 2017-07-01 NOTE — Anesthesia Postprocedure Evaluation (Signed)
Anesthesia Post Note  Patient: Corey West  Procedure(s) Performed: ESOPHAGOGASTRODUODENOSCOPY (EGD) (N/A )  Patient location during evaluation: PACU Anesthesia Type: General Level of consciousness: awake Pain management: satisfactory to patient Vital Signs Assessment: post-procedure vital signs reviewed and stable Respiratory status: nonlabored ventilation Cardiovascular status: stable Anesthetic complications: no     Last Vitals:  Vitals:   06/28/17 1238 06/28/17 1420  BP: (!) 148/64 (!) 141/52  Pulse: 78 72  Resp: 16 16  Temp:  36.7 C  SpO2: 100% 100%    Last Pain:  Vitals:   06/28/17 1420  TempSrc: Oral  PainSc:                  VAN STAVEREN,Yussuf Sawyers

## 2017-07-01 NOTE — Discharge Summary (Signed)
[facility] [place] NAME: Corey West    MR#:  [id number]  DATE OF BIRTH:  14-Jul-1951  DATE OF ADMISSION:  06/26/2017 ADMITTING PHYSICIAN: Henreitta Leber, MD  DATE OF DISCHARGE: 06/28/2017  2:42 PM  PRIMARY CARE PHYSICIAN: Madelyn Brunner, MD   ADMISSION DIAGNOSIS:  Symptomatic anemia [D64.9]  DISCHARGE DIAGNOSIS:  Active Problems:   GI bleed   Symptomatic anemia   SECONDARY DIAGNOSIS:   Past Medical History:  Diagnosis Date  . Anemia   . Anxiety    controlled;   . Arthritis   . AVM (arteriovenous malformation) of stomach, acquired with hemorrhage   . Barrett's esophagus   . Chronic kidney disease    renal infufficiency  . Cirrhosis ([place])   . Depression    controlled;   Marland Kitchen Diabetes mellitus without complication ([place])    not controlled, taking insulin but sugar continues to run high;   . Esophageal varices ([place])   . GERD (gastroesophageal reflux disease)   . History of hiatal hernia   . Hypertension    controlled well;   . Polyp, stomach    with chronic blood loss  . Sleep apnea    does not wear a cpap, Medicare would not pay for it   . Venous stasis of both lower extremities    with cellulitis     ADMITTING HISTORY  CHIEF COMPLAINT:  No chief complaint on file. Anemia, GI bleed.   HISTORY OF PRESENT ILLNESS:  Corey West  is a 66 y.o. male with a known history of Morbid obesity, anxiety, chronic kidney disease stage III, history of Barrett's esophagus, osteoarthritis, chronic lower extremity lymphedema, diabetes, history of liver cirrhosis, depression, history of esophageal varices, gastric polyps who presented to the hospital due to symptomatic anemia with a hemoglobin of 6.1. Patient was scheduled to get a upper GI endoscopy as an outpatient today and during preoperative blood work was noted to have a hemoglobin of 6.1. Patient does say that he has been having significant exertional dyspnea over the past few days,  he also admits to some intermittent dizziness, but no nausea vomiting, syncope. He denies any acute chest pain. Denies any abdominal pain, he does have dark stools chronically due to the iron that he takes. Given his significant anemia he was directly admitted to the hospital for blood transfusion. GI will be consulted and plan for upper GI endoscopy tomorrow.  HOSPITAL COURSE:   66 year old male with a known history of Morbid obesity, anxiety, chronic kidney disease stage III, history of Barrett's esophagus, osteoarthritis, chronic lower extremity lymphedema, diabetes, history of liver cirrhosis, depression, history of esophageal varices, gastric polyps who presented to the hospital due to symptomatic anemia with a hemoglobin of 6.1.  * Upper GI bleed with acute blood loss anemia First EGD showed barretts esophagus and gastritis with recent bleeding. Cauterized. Discussed with Dr. Vira Agar. EGD repeated next day and no bleeding Hb stable Started on protonix drip and later stopped F/u With Dr. Vira Agar as OP  * Chronic lower extremity edema with right lower extremity wounds-patient has an Unna boot on the right lower extremity. Follows up with the wound clinic.   * DM Type II - pt. Only on clear liquid diet. Well controlled  * History of liver cirrhosis-no evidence of hepatic encephalopathy. Continue Xifaxan. Hold lactulose for now.  * History of chronic lower extremity edema-continue Lasix, Aldactone, metolazone.  * Anxiety-continue Xanax as needed.  * CKD  3- stable  Stable for discharge home No further bleeding and HB stable  CONSULTS OBTAINED:  Treatment Team:  Manya Silvas, MD  DRUG ALLERGIES:  No Known Allergies  DISCHARGE MEDICATIONS:   Discharge Medication List as of 06/28/2017  2:16 PM    CONTINUE these medications which have NOT CHANGED   Details  ammonium lactate (AMLACTIN) 12 % cream Apply topically 2 (two) times daily., Historical Med    Ferrous  Sulfate 140 (45 FE) MG TBCR Take 140 mg by mouth daily. , Historical Med    furosemide (LASIX) 40 MG tablet Take 40 mg by mouth 2 (two) times daily., Historical Med    insulin aspart protamine- aspart (NOVOLOG MIX 70/30) (70-30) 100 UNIT/ML injection Inject 0.5 mLs (50 Units total) into the skin 2 (two) times daily with a meal. Pt uses 84 units with breakfast and 62 units with supper., Starting Fri 02/22/2017, Normal    lactulose (CHRONULAC) 10 GM/15ML solution Take 40 g by mouth at bedtime. , Historical Med    LORazepam (ATIVAN) 0.5 MG tablet Take 0.5 mg by mouth 3 (three) times daily as needed for anxiety. , Historical Med    omeprazole (PRILOSEC) 40 MG capsule Take 40 mg by mouth 2 (two) times daily., Historical Med    rifaximin (XIFAXAN) 550 MG TABS tablet Take 550 mg by mouth 2 (two) times daily., Historical Med    spironolactone (ALDACTONE) 25 MG tablet Take 25 mg by mouth daily., Historical Med    sucralfate (CARAFATE) 1 g tablet Take 1 g by mouth 3 (three) times daily. , Historical Med    traMADol (ULTRAM) 50 MG tablet Take 50 mg by mouth every 8 (eight) hours as needed for moderate pain. , Historical Med      STOP taking these medications     metolazone (ZAROXOLYN) 2.5 MG tablet         Today   VITAL SIGNS:  Blood pressure (!) 141/52, pulse 72, temperature 98.1 F (36.7 C), temperature source Oral, resp. rate 16, height 5\' 7"  (1.702 m), weight (!) 140.6 kg (310 lb), SpO2 100 %.  I/O:  No intake or output data in the 24 hours ending 07/01/17 1746  PHYSICAL EXAMINATION:  Physical Exam  GENERAL:  66 y.o.-year-old patient lying in the bed with no acute distress. Obese LUNGS: Normal breath sounds bilaterally, no wheezing, rales,rhonchi or crepitation. No use of accessory muscles of respiration.  CARDIOVASCULAR: S1, S2 normal. No murmurs, rubs, or gallops.  ABDOMEN: Soft, non-tender, non-distended. Bowel sounds present. No organomegaly or mass.  NEUROLOGIC: Moves all 4  extremities. PSYCHIATRIC: The patient is alert and oriented x 3.  SKIN: No obvious rash, lesion, or ulcer.   DATA REVIEW:   CBC  Recent Labs Lab 06/27/17 0601  06/28/17 0522  WBC 5.8  --   --   HGB 8.5*  < > 8.0*  HCT 25.0*  --   --   PLT 136*  --   --   < > = values in this interval not displayed.  Chemistries   Recent Labs Lab 06/27/17 0601  NA 137  K 3.4*  CL 104  CO2 26  GLUCOSE 175*  BUN 20  CREATININE 1.85*  CALCIUM 8.4*    Cardiac Enzymes No results for input(s): TROPONINI in the last 168 hours.  Microbiology Results  Results for orders placed or performed during the hospital encounter of 06/12/17  Blood culture (routine x 2)     Status: None   Collection  Time: 06/12/17 10:53 AM  Result Value Ref Range Status   Specimen Description BLOOD BLOOD LEFT HAND  Final   Special Requests   Final    BOTTLES DRAWN AEROBIC AND ANAEROBIC Blood Culture adequate volume   Culture NO GROWTH 5 DAYS  Final   Report Status 06/17/2017 FINAL  Final  Blood culture (routine x 2)     Status: None   Collection Time: 06/12/17 10:53 AM  Result Value Ref Range Status   Specimen Description BLOOD BLOOD RIGHT FOREARM  Final   Special Requests   Final    BOTTLES DRAWN AEROBIC AND ANAEROBIC Blood Culture results may not be optimal due to an excessive volume of blood received in culture bottles   Culture NO GROWTH 5 DAYS  Final   Report Status 06/17/2017 FINAL  Final  Aerobic/Anaerobic Culture (surgical/deep wound)     Status: None   Collection Time: 06/12/17  4:28 PM  Result Value Ref Range Status   Specimen Description LEG  Final   Special Requests NONE  Final   Gram Stain   Final    FEW WBC PRESENT, PREDOMINANTLY PMN FEW GRAM NEGATIVE RODS FEW GRAM POSITIVE COCCI    Culture   Final    FEW SERRATIA MARCESCENS MODERATE ENTEROCOCCUS FAECALIS FEW KLEBSIELLA OXYTOCA NO ANAEROBES ISOLATED Performed at [facility], [place] [address] [place], [place] [address]    Report  Status 06/18/2017 FINAL  Final   Organism ID, Bacteria SERRATIA MARCESCENS  Final   Organism ID, Bacteria ENTEROCOCCUS FAECALIS  Final   Organism ID, Bacteria KLEBSIELLA OXYTOCA  Final      Susceptibility   Enterococcus faecalis - MIC*    AMPICILLIN <=2 SENSITIVE Sensitive     VANCOMYCIN 1 SENSITIVE Sensitive     GENTAMICIN SYNERGY RESISTANT Resistant     * MODERATE ENTEROCOCCUS FAECALIS   Klebsiella oxytoca - MIC*    AMPICILLIN >=32 RESISTANT Resistant     CEFAZOLIN 16 SENSITIVE Sensitive     CEFEPIME <=1 SENSITIVE Sensitive     CEFTAZIDIME <=1 SENSITIVE Sensitive     CEFTRIAXONE <=1 SENSITIVE Sensitive     CIPROFLOXACIN <=0.25 SENSITIVE Sensitive     GENTAMICIN <=1 SENSITIVE Sensitive     IMIPENEM <=0.25 SENSITIVE Sensitive     TRIMETH/SULFA <=20 SENSITIVE Sensitive     AMPICILLIN/SULBACTAM 8 SENSITIVE Sensitive     PIP/TAZO <=4 SENSITIVE Sensitive     Extended ESBL NEGATIVE Sensitive     * FEW KLEBSIELLA OXYTOCA   Serratia marcescens - MIC*    CEFAZOLIN >=64 RESISTANT Resistant     CEFEPIME <=1 SENSITIVE Sensitive     CEFTAZIDIME <=1 SENSITIVE Sensitive     CEFTRIAXONE <=1 SENSITIVE Sensitive     CIPROFLOXACIN <=0.25 SENSITIVE Sensitive     GENTAMICIN <=1 SENSITIVE Sensitive     TRIMETH/SULFA <=20 SENSITIVE Sensitive     * FEW SERRATIA MARCESCENS    RADIOLOGY:  No results found.  Follow up with PCP in 1 week.  Management plans discussed with the patient, family and they are in agreement.  CODE STATUS:  Code Status History    Date Active Date Inactive Code Status Order ID Comments User Context   06/26/2017 11:19 AM 06/28/2017  5:48 PM Full Code [id number]  Henreitta Leber, MD Inpatient   02/21/2017  8:07 PM 02/22/2017  6:03 PM Full Code [id number]  [name], MD Inpatient   07/20/2015 12:45 PM 07/22/2015 10:17 PM Full Code [id number]  Nicholes Mango, MD Inpatient  02/08/2015 11:31 PM 02/11/2015  7:43 PM Full Code [id number]  Hower, Aaron Mose, MD ED      TOTAL  TIME TAKING CARE OF THIS PATIENT ON DAY OF DISCHARGE: more than 30 minutes.   Hillary Bow R M.D on 07/01/2017 at 5:46 PM  Between 7am to 6pm - Pager - [phone]  After 6pm go to [email or web address] - password [email or web address] [facility]  Office  [phone]  CC: Primary care physician; Madelyn Brunner, MD  Note: This dictation was prepared with Dragon dictation along with smaller phrase technology. Any transcriptional errors that result from this process are unintentional.

## 2017-07-02 ENCOUNTER — Encounter: Payer: Medicare Other | Attending: Internal Medicine | Admitting: Internal Medicine

## 2017-07-02 ENCOUNTER — Encounter: Payer: Medicare Other | Admitting: Occupational Therapy

## 2017-07-02 DIAGNOSIS — Z794 Long term (current) use of insulin: Secondary | ICD-10-CM | POA: Diagnosis not present

## 2017-07-02 DIAGNOSIS — K7460 Unspecified cirrhosis of liver: Secondary | ICD-10-CM | POA: Diagnosis not present

## 2017-07-02 DIAGNOSIS — I899 Noninfective disorder of lymphatic vessels and lymph nodes, unspecified: Secondary | ICD-10-CM | POA: Diagnosis not present

## 2017-07-02 DIAGNOSIS — F17290 Nicotine dependence, other tobacco product, uncomplicated: Secondary | ICD-10-CM | POA: Diagnosis not present

## 2017-07-02 DIAGNOSIS — G4733 Obstructive sleep apnea (adult) (pediatric): Secondary | ICD-10-CM | POA: Diagnosis not present

## 2017-07-02 DIAGNOSIS — D649 Anemia, unspecified: Secondary | ICD-10-CM | POA: Insufficient documentation

## 2017-07-02 DIAGNOSIS — L97211 Non-pressure chronic ulcer of right calf limited to breakdown of skin: Secondary | ICD-10-CM | POA: Insufficient documentation

## 2017-07-02 DIAGNOSIS — Z6841 Body Mass Index (BMI) 40.0 and over, adult: Secondary | ICD-10-CM | POA: Diagnosis not present

## 2017-07-02 DIAGNOSIS — I890 Lymphedema, not elsewhere classified: Secondary | ICD-10-CM | POA: Diagnosis not present

## 2017-07-02 DIAGNOSIS — I87331 Chronic venous hypertension (idiopathic) with ulcer and inflammation of right lower extremity: Secondary | ICD-10-CM | POA: Diagnosis present

## 2017-07-02 DIAGNOSIS — E11622 Type 2 diabetes mellitus with other skin ulcer: Secondary | ICD-10-CM | POA: Insufficient documentation

## 2017-07-02 DIAGNOSIS — I10 Essential (primary) hypertension: Secondary | ICD-10-CM | POA: Diagnosis not present

## 2017-07-04 ENCOUNTER — Encounter: Payer: Medicare Other | Admitting: Occupational Therapy

## 2017-07-04 NOTE — Progress Notes (Signed)
Corey West, Corey West ([id number]) Visit Report for 07/02/2017 HPI Details Patient Name: Corey West, Corey West. Date of Service: 07/02/2017 3:15 PM Medical Record Number: [id number] Patient Account Number: [id number] Date of Birth/Sex: [birth year] (66 y.o. Male) Treating RN: Ahmed Prima Primary Care Provider: Lynett Fish Other Clinician: Referring Provider: Lynett Fish Treating Provider/Extender: Tito Dine in Treatment: 10 History of Present Illness HPI Description: Pleasant 66 year old with a past medical history significant for morbid obesity, insulin- dependent type 2 diabetes (hemoglobin A1c 7.2 in January 2015), cirrhosis, and chronic kidney disease. He reports a left anterior calf ulcer since January 2015. He has had venous stasis ulcers in the past, which have healed spontaneously. No history of DVT. Does not regularly wear compression. Ambulating normally per his baseline. Recently completed a course of antibiotics. Treated with a Profore light and Coban 2 layer compression drainage, which both fell down and he cut off. Tolerated Coban lite compression bandage. Returns to clinic for followup. No complaints today. No pain at this time. No fever or chills. No open wounds at this time. He is having difficulty finding compression garments that fit him. 04/23/17; this is now 66 year old man who returns to our clinic for nonhealing wounds on the right lateral and right posterior calf. He tells me that they have been present for 6 months. In the setting of severe right greater than left lower extremity lymphedema likely secondary to chronic venous insufficiency. He has been followed in the lymphedema clinic at least since early June with compression wraps although these have not really helped and the patient is here for our review of this situation. The patient has had problems with edema in his legs for years. He has not worn compression. He is type II diabetic that sounds  poorly controlled. He also has obstructive sleep apnea and cirrhosis. He is occasional cigar smoker 04/30/17; patient is a 66 year old man who has severe bilateral lower extremity lymphedema stage III since his early 65s he tells me. This is no doubt secondary to chronic venous insufficiency. He was going to the lymphedema clinic who forwarded him here due to skin breakdown in the right leg. He has not previously worn compression stockings. He has a large area of denuded epithelium on the posterior leg. He has a more open wound on the right lateral leg 05/07/17 on evaluation today patient appears to be doing about the same his wounds are showing signs of improvement and looking fairly well. With that being said he is not having any severe discomfort just some mild aching due to the swelling and really this is worse on the left than the right and the right side is where he actually has his wounds. He has been tolerating the [employer] wraps without complication and states this is actually much better than the wraps that he was getting at the lymphedema clinic most recently. He is happy with these wraps. He has no nausea, vomiting, diarrhea and no fevers or chills. We are still waiting to hear back in regard to the lymphedema pumps which I do think would be beneficial for him even with the compression wraps he still has a significant amount of edema noted of the bilateral lower extremities. 05/15/17 on evaluation today patient's right lower extremity edema and weeping especially the posterior portion appears to be doing better. He still is having some issue here but fortunately it's minimal compared Corey West, Corey A. ([id number]) to previous weeks. He continues to tolerate the compression fairly well. Overall he  is pleased with how things are progressing. No fevers, chills, nausea, or vomiting noted at this time. And patient is having only minimal discomfort. 05/22/17; the medial wound on the right leg  actually looks considerably better and is epithelialized. He still has a small punched out area on the right lateral leg. Edema control is marginal end of the [employer]. We are working on getting him compression pumps 05/29/17; the patient's lymphedema pumps are apparently arriving tonight. In the meantime he took his Unna boots off last Sunday because the edema had progressed to the point that a word to titrate. Since then he has put nothing on his right leg and more specifically nothing on the right medial and lateral wounds 06/05/17; the patient has his lymphedema pumps and he is using them once a day on the right leg. There is a sizable reduction in the circumference of his leg although he arrived in clinic today somewhat short of breath. 07/02/17; since the patient was last in clinic he was admitted on 2 occasions. He was admitted from 9/12 through 9/13 with lactic acidosis weakness and felt to have left lower extremity cellulitis. Due to the elevated lactic acid level he was felt to have sepsis although this was ruled out and the lactic acidosis was felt secondary to his chronic cirrhosis. He was initially treated with IV vancomycin for cellulitis and redness of his left leg but then this was subsequently recognized to be secondary to chronic venous stasis and chronic lymphedema. When he left the hospital he had Unna boots placed bilaterally. He was readmitted on 9/26 through 9/28 with GI bleeding. His EGD showed Barrett's esophagus and gastritis. He was discharged with a hemoglobin of 8 to follow-up with GI as an outpatient. There was nothing more about his lower extremities during this short hospital stay. The patient has not been using his external compression pumps fearing fluid overload and shortness of breath although he feels better now that his hemoglobin is stabilized. He continues to have epithelial loss on his bilateral lower extremities Electronic Signature(s) Signed: 07/03/2017  8:40:51 AM By: Linton Ham MD Entered By: Linton Ham on 07/03/2017 07:55:12 Corey West ([id number]) -------------------------------------------------------------------------------- Physical Exam Details Patient Name: Corey West, Corey A. Date of Service: 07/02/2017 3:15 PM Medical Record Number: [id number] Patient Account Number: [id number] Date of Birth/Sex: 07-31-1951 (66 y.o. Male) Treating RN: Ahmed Prima Primary Care Provider: Lynett Fish Other Clinician: Referring Provider: Lynett Fish Treating Provider/Extender: Corey West Weeks in Treatment: 10 Constitutional Sitting or standing Blood Pressure is within target range for patient.. Pulse regular and within target range for patient.Marland Kitchen Respirations regular, non-labored and within target range.. Temperature is normal and within the target range for the patient.Marland Kitchen appears in no distress. The patient appears medically stable. Eyes Conjunctivae clear. No discharge. Respiratory Respiratory effort is easy and symmetric bilaterally. Rate is normal at rest and on room air.. Bilateral breath sounds are clear and equal in all lobes with no wheezes, rales or rhonchi.. Cardiovascular Heart rhythm and rate regular, without murmur or gallop. Edema extends into his bilateral thighs. I saw no clinical evidence of DVT. Lymphedema bilaterally although much less than I remember this man having 3 weeks ago. Gastrointestinal (GI) Abdomen is soft and non-distended without masses or tenderness. Bowel sounds active in all quadrants.. Integumentary (Hair, Skin) There is no widespread rash. Clear lymphedema in his bilateral lower extremities. Psychiatric Seems stable, right and interactive. Notes Wound exam; he has denuded epithelium on the right calf  posteriorly and on the left calf anteriorly. Both of these look reasonably superficial and I think if the edema is controlled there would be no difficulty healing these areas. There  is no evidence of infection in either site and if there was a clinical concern about cellulitis earlier this month I see none of that now. No evidence of a DVT Electronic Signature(s) Signed: 07/03/2017 8:40:51 AM By: Linton Ham MD Entered By: Linton Ham on 07/03/2017 07:58:15 Corey West ([id number]) -------------------------------------------------------------------------------- Physician Orders Details Patient Name: Corey West, Corey A. Date of Service: 07/02/2017 3:15 PM Medical Record Number: [id number] Patient Account Number: [id number] Date of Birth/Sex: 01-02-1951 (66 y.o. Male) Treating RN: Ahmed Prima Primary Care Provider: Lynett Fish Other Clinician: Referring Provider: Lynett Fish Treating Provider/Extender: Tito Dine in Treatment: 10 Verbal / Phone Orders: Yes ClinicianCarolyne Fiscal, Debi Read Back and Verified: Yes Diagnosis Coding Wound Cleansing Wound #3 Right,Posterior Lower Leg o Clean wound with Normal Saline. o Cleanse wound with mild soap and water o May Shower, gently pat wound dry prior to applying new dressing. Wound #4 Right,Lateral Lower Leg o Clean wound with Normal Saline. o Cleanse wound with mild soap and water o May Shower, gently pat wound dry prior to applying new dressing. Wound #5 Left,Anterior Lower Leg o Clean wound with Normal Saline. o Cleanse wound with mild soap and water o May Shower, gently pat wound dry prior to applying new dressing. Anesthetic Wound #3 Right,Posterior Lower Leg o Topical Lidocaine 4% cream applied to wound bed prior to debridement Wound #4 Right,Lateral Lower Leg o Topical Lidocaine 4% cream applied to wound bed prior to debridement Wound #5 Left,Anterior Lower Leg o Topical Lidocaine 4% cream applied to wound bed prior to debridement Primary Wound Dressing Wound #3 Right,Posterior Lower Leg o Silvercel Non-Adherent Wound #4 Right,Lateral Lower Leg o  [place] Non-Adherent Wound #5 Left,Anterior Lower Leg o [place] Non-Adherent Secondary Dressing Corey West, Corey West ([id number]) Wound #3 Right,Posterior Lower Leg o ABD pad o XtraSorb Wound #4 Right,Lateral Lower Leg o ABD pad o XtraSorb Wound #5 Left,Anterior Lower Leg o ABD pad o XtraSorb Dressing Change Frequency Wound #3 Right,Posterior Lower Leg o Change dressing every week Wound #4 Right,Lateral Lower Leg o Change dressing every week Wound #5 Left,Anterior Lower Leg o Change dressing every week Follow-up Appointments Wound #3 Right,Posterior Lower Leg o Return Appointment in 1 week. Wound #4 Right,Lateral Lower Leg o Return Appointment in 1 week. Wound #5 Left,Anterior Lower Leg o Return Appointment in 1 week. Edema Control Wound #3 Right,Posterior Lower Leg o 3 Layer Compression System - Right Lower Extremity - unna to anchor o Compression Pump: Use compression pump on left lower extremity for 30 minutes, twice daily. Wound #4 Right,Lateral Lower Leg o 3 Layer Compression System - Right Lower Extremity - unna to anchor o Compression Pump: Use compression pump on left lower extremity for 30 minutes, twice daily. Wound #5 Left,Anterior Lower Leg o 3 Layer Compression System - Right Lower Extremity - unna to anchor o Compression Pump: Use compression pump on left lower extremity for 30 minutes, twice daily. Corey West, Corey West ([id number]) Additional Orders / Instructions Wound #3 Right,Posterior Lower Leg o Increase protein intake. o Other: - Get Blood Sugars below 150 Wound #4 Right,Lateral Lower Leg o Increase protein intake. o Other: - Get Blood Sugars below 150 Wound #5 Left,Anterior Lower Leg o Increase protein intake. o Other: - Get Blood Sugars below 150 Electronic Signature(s) Signed: 07/02/2017 5:08:57 PM By:  Alric Quan Signed: 07/03/2017 8:40:51 AM By: Linton Ham MD Entered By: Alric Quan  on 07/02/2017 16:37:14 Corey West ([id number]) -------------------------------------------------------------------------------- Problem List Details Patient Name: MAJOR, SANTERRE A. Date of Service: 07/02/2017 3:15 PM Medical Record Number: [id number] Patient Account Number: [id number] Date of Birth/Sex: 1951/06/05 (66 y.o. Male) Treating RN: Ahmed Prima Primary Care Provider: Lynett Fish Other Clinician: Referring Provider: Lynett Fish Treating Provider/Extender: Tito Dine in Treatment: 10 Active Problems ICD-10 Encounter Code Description Active Date Diagnosis I89.0 Lymphedema, not elsewhere classified 04/23/2017 Yes L97.211 Non-pressure chronic ulcer of right calf limited to 04/23/2017 Yes breakdown of skin I87.331 Chronic venous hypertension (idiopathic) with ulcer and 04/23/2017 Yes inflammation of right lower extremity E11.622 Type 2 diabetes mellitus with other skin ulcer 04/23/2017 Yes I89.9 Noninfective disorder of lymphatic vessels and lymph 04/23/2017 Yes nodes, unspecified Inactive Problems Resolved Problems Electronic Signature(s) Signed: 07/03/2017 8:40:51 AM By: Linton Ham MD Entered By: Linton Ham on 07/03/2017 07:51:38 Corey West ([id number]) -------------------------------------------------------------------------------- Progress Note Details Patient Name: Corey West. Date of Service: 07/02/2017 3:15 PM Medical Record Number: [id number] Patient Account Number: [id number] Date of Birth/Sex: November 11, 1950 (66 y.o. Male) Treating RN: Ahmed Prima Primary Care Provider: Lynett Fish Other Clinician: Referring Provider: Lynett Fish Treating Provider/Extender: Tito Dine in Treatment: 10 Subjective History of Present Illness (HPI) Pleasant 66 year old with a past medical history significant for morbid obesity, insulin-dependent type 2 diabetes (hemoglobin A1c 7.2 in January 2015), cirrhosis, and chronic  kidney disease. He reports a left anterior calf ulcer since January 2015. He has had venous stasis ulcers in the past, which have healed spontaneously. No history of DVT. Does not regularly wear compression. Ambulating normally per his baseline. Recently completed a course of antibiotics. Treated with a Profore light and Coban 2 layer compression drainage, which both fell down and he cut off. Tolerated Coban lite compression bandage. Returns to clinic for followup. No complaints today. No pain at this time. No fever or chills. No open wounds at this time. He is having difficulty finding compression garments that fit him. 04/23/17; this is now 66 year old man who returns to our clinic for nonhealing wounds on the right lateral and right posterior calf. He tells me that they have been present for 6 months. In the setting of severe right greater than left lower extremity lymphedema likely secondary to chronic venous insufficiency. He has been followed in the lymphedema clinic at least since early June with compression wraps although these have not really helped and the patient is here for our review of this situation. The patient has had problems with edema in his legs for years. He has not worn compression. He is type II diabetic that sounds poorly controlled. He also has obstructive sleep apnea and cirrhosis. He is occasional cigar smoker 04/30/17; patient is a 66 year old man who has severe bilateral lower extremity lymphedema stage III since his early 37s he tells me. This is no doubt secondary to chronic venous insufficiency. He was going to the lymphedema clinic who forwarded him here due to skin breakdown in the right leg. He has not previously worn compression stockings. He has a large area of denuded epithelium on the posterior leg. He has a more open wound on the right lateral leg 05/07/17 on evaluation today patient appears to be doing about the same his wounds are showing signs  of improvement and looking fairly well. With that being said he is not having any severe discomfort just some  mild aching due to the swelling and really this is worse on the left than the right and the right side is where he actually has his wounds. He has been tolerating the [employer] wraps without complication and states this is actually much better than the wraps that he was getting at the lymphedema clinic most recently. He is happy with these wraps. He has no nausea, vomiting, diarrhea and no fevers or chills. We are still waiting to hear back in regard to the lymphedema pumps which I do think would be beneficial for him even with the compression wraps he still has a significant amount of edema noted of the bilateral lower extremities. 05/15/17 on evaluation today patient's right lower extremity edema and weeping especially the posterior portion appears to be doing better. He still is having some issue here but fortunately it's minimal compared to previous weeks. He continues to tolerate the compression fairly well. Overall he is pleased with how things are progressing. No fevers, chills, nausea, or vomiting noted at this time. And patient is having only Corey West, Corey A. ([id number]) minimal discomfort. 05/22/17; the medial wound on the right leg actually looks considerably better and is epithelialized. He still has a small punched out area on the right lateral leg. Edema control is marginal end of the [employer]. We are working on getting him compression pumps 05/29/17; the patient's lymphedema pumps are apparently arriving tonight. In the meantime he took his Unna boots off last Sunday because the edema had progressed to the point that a word to titrate. Since then he has put nothing on his right leg and more specifically nothing on the right medial and lateral wounds 06/05/17; the patient has his lymphedema pumps and he is using them once a day on the right leg. There is a sizable reduction in  the circumference of his leg although he arrived in clinic today somewhat short of breath. 07/02/17; since the patient was last in clinic he was admitted on 2 occasions. He was admitted from 9/12 through 9/13 with lactic acidosis weakness and felt to have left lower extremity cellulitis. Due to the elevated lactic acid level he was felt to have sepsis although this was ruled out and the lactic acidosis was felt secondary to his chronic cirrhosis. He was initially treated with IV vancomycin for cellulitis and redness of his left leg but then this was subsequently recognized to be secondary to chronic venous stasis and chronic lymphedema. When he left the hospital he had Unna boots placed bilaterally. He was readmitted on 9/26 through 9/28 with GI bleeding. His EGD showed Barrett's esophagus and gastritis. He was discharged with a hemoglobin of 8 to follow-up with GI as an outpatient. There was nothing more about his lower extremities during this short hospital stay. The patient has not been using his external compression pumps fearing fluid overload and shortness of breath although he feels better now that his hemoglobin is stabilized. He continues to have epithelial loss on his bilateral lower extremities Objective Constitutional Sitting or standing Blood Pressure is within target range for patient.. Pulse regular and within target range for patient.Marland Kitchen Respirations regular, non-labored and within target range.. Temperature is normal and within the target range for the patient.Marland Kitchen appears in no distress. The patient appears medically stable. Vitals Time Taken: 3:18 PM, Height: 69 in, Weight: 305.4 lbs, BMI: 45.1, Temperature: 97.8 F, Pulse: 68 bpm, Respiratory Rate: 20 breaths/min, Blood Pressure: 130/50 mmHg. Eyes Conjunctivae clear. No discharge. Respiratory Respiratory effort is  easy and symmetric bilaterally. Rate is normal at rest and on room air.. Bilateral breath sounds are clear and  equal in all lobes with no wheezes, rales or rhonchi.Marland Kitchen Corey West, Corey West ([id number]) Cardiovascular Heart rhythm and rate regular, without murmur or gallop. Edema extends into his bilateral thighs. I saw no clinical evidence of DVT. Lymphedema bilaterally although much less than I remember this man having 3 weeks ago. Gastrointestinal (GI) Abdomen is soft and non-distended without masses or tenderness. Bowel sounds active in all quadrants.. Psychiatric Seems stable, right and interactive. General Notes: Wound exam; he has denuded epithelium on the right calf posteriorly and on the left calf anteriorly. Both of these look reasonably superficial and I think if the edema is controlled there would be no difficulty healing these areas. There is no evidence of infection in either site and if there was a clinical concern about cellulitis earlier this month I see none of that now. No evidence of a DVT Integumentary (Hair, Skin) There is no widespread rash. Clear lymphedema in his bilateral lower extremities. Wound #3 status is Open. Original cause of wound was Gradually Appeared. The wound is located on the Right,Posterior Lower Leg. The wound measures 5cm length x 24cm width x 0.1cm depth; 94.248cm^2 area and 9.425cm^3 volume. There is no tunneling or undermining noted. There is a large amount of serous drainage noted. The wound margin is flat and intact. There is medium (34-66%) pink granulation within the wound bed. There is a medium (34-66%) amount of necrotic tissue within the wound bed including Adherent Slough. The periwound skin appearance exhibited: Maceration. The periwound skin appearance did not exhibit: Callus, Crepitus, Excoriation, Induration, Rash, Scarring, Dry/Scaly, Atrophie Blanche, Cyanosis, Ecchymosis, Hemosiderin Staining, Mottled, Pallor, Rubor, Erythema. Periwound temperature was noted as No Abnormality. Wound #4 status is Open. Original cause of wound was Gradually Appeared.  The wound is located on the Right,Lateral Lower Leg. The wound measures 1.8cm length x 9.5cm width x 0.3cm depth; 13.43cm^2 area and 4.029cm^3 volume. There is no tunneling or undermining noted. There is a large amount of serous drainage noted. The wound margin is flat and intact. There is medium (34-66%) granulation within the wound bed. There is a medium (34-66%) amount of necrotic tissue within the wound bed including Adherent Slough. The periwound skin appearance did not exhibit: Callus, Crepitus, Excoriation, Induration, Rash, Scarring, Dry/Scaly, Maceration, Atrophie Blanche, Cyanosis, Ecchymosis, Hemosiderin Staining, Mottled, Pallor, Rubor, Erythema. Periwound temperature was noted as No Abnormality. Wound #5 status is Open. Original cause of wound was Gradually Appeared. The wound is located on the Left,Anterior Lower Leg. The wound measures 2cm length x 4.3cm width x 0.1cm depth; 6.754cm^2 area and 0.675cm^3 volume. There is no tunneling or undermining noted. There is a large amount of serous drainage noted. The wound margin is distinct with the outline attached to the wound base. There is large (67-100%) pink granulation within the wound bed. There is no necrotic tissue within the wound bed. The periwound skin appearance exhibited: Maceration. Periwound temperature was noted as No Abnormality. The periwound has tenderness on palpation. 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 Cleanse wound with mild soap and  water May Shower, gently pat wound dry prior to applying new dressing. Wound #4 Right,Lateral Lower Leg: Clean wound with Normal Saline. Cleanse wound with mild soap and water May Shower, gently pat wound dry prior to applying new dressing. Wound #5 Left,Anterior Lower Leg: Clean wound with Normal Saline. Cleanse wound with mild soap and water May Shower, gently pat wound dry prior to applying new dressing. Anesthetic: Wound #3 Right,Posterior Lower Leg: Topical Lidocaine 4% cream applied to wound bed prior to debridement Wound #4 Right,Lateral Lower Leg: Topical Lidocaine 4% cream applied to wound bed prior to debridement Wound #5 Left,Anterior Lower Leg: Topical Lidocaine 4% cream applied to wound bed prior to debridement Primary Wound Dressing: Wound #3 Right,Posterior Lower Leg: [place] Non-Adherent Wound #4 Right,Lateral Lower Leg: [employer] Non-Adherent Wound #5 Left,Anterior Lower Leg: [employer] Non-Adherent Secondary Dressing: Wound #3 Right,Posterior Lower Leg: ABD pad XtraSorb Wound #4 Right,Lateral Lower Leg: LUCILLE, CRICHLOW ([id number]) ABD pad XtraSorb Wound #5 Left,Anterior Lower Leg: ABD pad XtraSorb Dressing Change Frequency: Wound #3 Right,Posterior Lower Leg: Change dressing every week Wound #4 Right,Lateral Lower Leg: Change dressing every week Wound #5 Left,Anterior Lower Leg: Change dressing every week Follow-up Appointments: Wound #3 Right,Posterior Lower Leg: Return Appointment in 1 week. Wound #4 Right,Lateral Lower Leg: Return Appointment in 1 week. Wound #5 Left,Anterior Lower Leg: Return Appointment in 1 week. Edema Control: Wound #3 Right,Posterior Lower Leg: 3 Layer Compression System - Right Lower Extremity - unna to anchor Compression Pump: Use compression pump on left lower extremity for 30 minutes, twice daily. Wound #4 Right,Lateral Lower Leg: 3 Layer Compression System - Right Lower Extremity - unna to anchor Compression Pump:  Use compression pump on left lower extremity for 30 minutes, twice daily. Wound #5 Left,Anterior Lower Leg: 3 Layer Compression System - Right Lower Extremity - unna to anchor Compression Pump: Use compression pump on left lower extremity for 30 minutes, twice daily. Additional Orders / Instructions: Wound #3 Right,Posterior Lower Leg: Increase protein intake. Other: - Get Blood Sugars below 150 Wound #4 Right,Lateral Lower Leg: Increase protein intake. Other: - Get Blood Sugars below 150 Wound #5 Left,Anterior Lower Leg: Increase protein intake. Other: - Get Blood Sugars below [address], Trevon A. ([id number]) #1 the patient has superficial wounds on both lower extremities. I think if we could lower the amount of lymphedema here these would heal. #2 I see no evidence of systemic fluid volume overload and I think the patient can go back on his external compression pumps #3 we used silver alginate as the primary dressing with 3 layer compression wraps. I thought the patient could use his external compression pumps at home one hour once a day. I would be surprised at this point if the patient doesn't have healing from this degree of additional compression #4 the patient has cirrhosis of the liver, stage III chronic renal failure and I believe diastolic heart failure. All of this contributes to fluid volume overload but he definitely has lymphedema Electronic Signature(s) Signed: 07/03/2017 8:40:51 AM By: Linton Ham MD Entered By: Linton Ham on 07/03/2017 08:01:00 Corey West ([id number]) -------------------------------------------------------------------------------- Central Details Patient Name: [name] Date of Service: 07/02/2017 Medical Record Number: [id number] Patient Account Number: [id number] Date of Birth/Sex: Oct 06, 1950 (66 y.o. 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 Physician Procedures CPT4: Description Modifier Quantity Code [id number] - WC PHYS LEVEL 4 - EST PT 1 ICD-10 Description Diagnosis L97.821 Non-pressure chronic ulcer of other part of left lower leg limited to breakdown of skin I87.331 Chronic venous hypertension  (idiopathic) with ulcer and inflammation of right lower extremity L97.211 Non-pressure chronic ulcer of right calf limited to breakdown of skin I89.0 Lymphedema, not elsewhere classified Electronic Signature(s) Signed: 07/03/2017 8:40:51 AM By: Linton Ham MD Previous Signature: 07/02/2017 5:08:57 PM Version By: Alric Quan Entered By: Linton Ham on 07/03/2017 08:02:35

## 2017-07-04 NOTE — Progress Notes (Signed)
Corey West, Corey West ([id number]) Visit Report for 07/02/2017 Arrival Information Details Patient Name: Corey West, Corey West. Date of Service: 07/02/2017 3:15 PM Medical Record Number: [id number] Patient Account Number: [id number] Date of Birth/Sex: Mar 21, 1951 (66 y.o. Male) Treating RN: Corey West Primary Care Corey West: Corey West Other Clinician: Referring Corey West: Corey West Treating Corey West/Extender: Corey West in Treatment: 10 Visit Information History Since Last Visit All ordered tests and consults were completed: No Patient Arrived: Corey West Added or deleted any medications: No Arrival Time: 15:17 Any new allergies or adverse reactions: No Accompanied By: self Had a fall or experienced change in No Transfer Assistance: EasyPivot activities of daily living that may affect Patient Lift risk of falls: Patient Identification Verified: Yes Signs or symptoms of abuse/neglect since last No Secondary Verification Process Yes visito Completed: Hospitalized since last visit: No Patient Requires Transmission- No Has Dressing in Place as Prescribed: Yes Based Precautions: Pain Present Now: No Patient Has Alerts: No Electronic Signature(s) Signed: 07/02/2017 5:08:57 PM By: Corey West Entered By: Corey West on 07/02/2017 15:18:20 Corey West ([id number]) -------------------------------------------------------------------------------- Encounter Discharge Information Details Patient Name: Corey Cower A. Date of Service: 07/02/2017 3:15 PM Medical Record Number: [id number] Patient Account Number: [id number] Date of Birth/Sex: 12-Apr-1951 (66 y.o. Male) Treating RN: Corey West Primary Care Corey West: Corey West Other Clinician: Referring Corey West: Corey West Treating Harmonie Verrastro/Extender: Corey West in Treatment: 10 Encounter Discharge Information Items Discharge Pain Level: 0 Discharge Condition: Stable Ambulatory Status:  Cane Discharge Destination: Home Transportation: Private Auto Accompanied By: self Schedule Follow-up Appointment: Yes Medication Reconciliation completed No and provided to Patient/Care Corey West: Provided on Clinical Summary of Care: 07/02/2017 Form Type Recipient Paper Patient [name] Signature(s) Signed: 07/02/2017 5:08:57 PM By: Corey West Entered By: Corey West on 07/02/2017 16:37:57 Corey West ([id number]) -------------------------------------------------------------------------------- Lower Extremity Assessment Details Patient Name: Corey Cower A. Date of Service: 07/02/2017 3:15 PM Medical Record Number: [id number] Patient Account Number: [id number] Date of Birth/Sex: [birth year] (66 y.o. Male) Treating RN: Corey West Primary Care Shiheem Corporan: Corey West Other Clinician: Referring Corey West: Corey West Treating Tierre Gerard/Extender: Corey West in Treatment: 10 Edema Assessment Assessed: [Left: No] [Right: No] E[Left: dema] [Right: :] Calf Left: Right: Point of Measurement: 32 cm From Medial Instep 47.2 cm 49.5 cm Ankle Left: Right: Point of Measurement: 13 cm From Medial Instep 30.2 cm 30.2 cm Vascular Assessment Pulses: Dorsalis Pedis Palpable: [Left:Yes] [Right:Yes] Posterior Tibial Extremity colors, hair growth, and conditions: Extremity Color: [Left:Hyperpigmented] [Right:Hyperpigmented] Temperature of Extremity: [Left:Warm] [Right:Warm] Capillary Refill: [Left:< 3 seconds] [Right:< 3 seconds] Toe Nail Assessment Left: Right: Thick: No No Discolored: No No Deformed: No No Improper Length and Hygiene: No No Electronic Signature(s) Signed: 07/02/2017 5:08:57 PM By: Corey West Entered By: Corey West on 07/02/2017 15:44:09 Corey West ([id number]) -------------------------------------------------------------------------------- Multi Wound Chart Details Patient Name: Corey Cower A. Date of Service:  07/02/2017 3:15 PM Medical Record Number: [id number] Patient Account Number: [id number] Date of Birth/Sex: 20-Oct-1950 (66 y.o. 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 Lower Extremity Comorbid History: Cataracts, Anemia, Sleep Cataracts, Anemia, Sleep Cataracts, Anemia, Sleep Apnea, Hypertension, Apnea, Hypertension, Apnea, Hypertension, Cirrhosis , Type II Cirrhosis , Type II Cirrhosis , Type II Diabetes Diabetes Diabetes Date Acquired: 01/14/2017 01/07/2017 07/02/2017 Weeks of Treatment: 10 10 0 Wound Status: Open Open Open Clustered Wound: No No Yes Clustered Quantity: N/A N/A 2 Measurements L x W x D 5x24x0.1 1.8x9.5x0.3 2x4.3x0.1 (cm) Area (cm) : 94.248 13.43 6.754 Volume (cm) : 9.425 4.029 0.675 % Reduction in Area: -144.90% -1325.70% 0.00% % Reduction in Volume: 75.50% -2043.10% 0.00% Classification: Partial Thickness Partial Thickness Corey West ([id number]) Full Thickness Without Exposed Support Structures Exudate Amount: Large Large Large Exudate Type: Serous Serous Serous Exudate Color: amber amber amber Wound Margin: Flat and Intact Flat and Intact Distinct, outline attached Granulation Amount: Medium (34-66%) Medium (34-66%) Large (67-100%) Granulation Quality: Pink N/A Pink Necrotic Amount: Medium (34-66%)  Medium (34-66%) None Present (0%) Exposed Structures: Fascia: No Fascia: No N/A Fat Layer (Subcutaneous Fat Layer (Subcutaneous Tissue) Exposed: No Tissue) Exposed: No Tendon: No Tendon: No Muscle: No Muscle: No Joint: No Joint: No Bone: No Bone: No Epithelialization: Medium (34-66%) None None Periwound Skin Texture: Excoriation: No Excoriation: No No Abnormalities Noted Induration: No Induration: No Callus: No Callus: No Crepitus: No Crepitus: No Rash: No Rash: No Scarring: No Scarring: No Periwound Skin Maceration: Yes Maceration: No Maceration: Yes Moisture: Dry/Scaly: No Dry/Scaly: No Periwound Skin Color: Atrophie Blanche: No Atrophie Blanche: No No Abnormalities Noted Cyanosis: No Cyanosis: No Ecchymosis: No Ecchymosis: No Erythema: No Erythema: No Hemosiderin Staining: No Hemosiderin Staining: No Mottled: No Mottled: No Pallor: No Pallor: No Rubor: No Rubor: No Temperature: No Abnormality No Abnormality No Abnormality Tenderness on No No Yes Palpation: Wound Preparation: Ulcer Cleansing: Ulcer Cleansing: Ulcer Cleansing: Rinsed/Irrigated with Rinsed/Irrigated with Rinsed/Irrigated with Saline Saline Saline Topical Anesthetic Topical Anesthetic Topical Anesthetic Applied: Other: lidocaine Applied: Other: lidocaine Applied: Other: lidocaine 4% 4% 4% Treatment Notes Wound #3 (Right, Posterior Lower Leg) 1. Cleansed with: Clean wound with Normal Saline Corey West, Corey West A. ([id number]) Cleanse wound with antibacterial soap and water 2. Anesthetic Topical Lidocaine 4% cream to wound bed prior to debridement 4. Dressing Applied: Other dressing (specify in notes) 5. Secondary Dressing Applied ABD Pad 7. Secured with Tape 3 Layer Compression System - Bilateral Notes unna to anchor, silvercel Wound #4 (Right, Lateral Lower Leg) 1. Cleansed with: Clean wound with Normal Saline Cleanse wound with antibacterial soap and water 2. Anesthetic Topical  Lidocaine 4% cream to wound bed prior to debridement 4. Dressing Applied: Other dressing (specify in notes) 5. Secondary Dressing Applied ABD Pad 7. Secured with Tape 3 Layer Compression System - Bilateral Notes unna to anchor, silvercel Wound #5 (Left, Anterior Lower Leg) 1. Cleansed with: Clean wound with Normal Saline Cleanse wound with antibacterial soap and water 2. Anesthetic Topical Lidocaine 4% cream to wound bed prior to debridement 4. Dressing Applied: Other dressing (specify in notes) 5. Secondary Dressing Applied ABD Pad 7. Secured with Tape 3 Layer Compression System - Bilateral Notes unna to anchor, silvercel Corey West, Corey West ([id number]) Electronic Signature(s) Signed: 07/03/2017 8:40:51 AM By: Linton Ham MD Previous Signature: 07/02/2017 5:08:57 PM Version By: Corey West Entered By: Linton Ham on 07/03/2017 [place], [place] A. ([id number]) -------------------------------------------------------------------------------- [place] Details Patient Name: FAIZ, WEBER A. Date of Service: 07/02/2017 3:15 PM Medical Record Number: [id number] Patient Account Number: [id number] Date of Birth/Sex: [birth year] (66 y.o. Male) Treating  RN: Corey West Primary Care Tyrion Glaude: Corey West Other Clinician: Referring Tanyah Debruyne: Corey West Treating Nycholas Rayner/Extender: Corey West in Treatment: 10 Active Inactive ` Orientation to the Wound Care Program Nursing Diagnoses: Knowledge deficit related to the wound healing center program Goals: Patient/caregiver will verbalize understanding of the [facility] Program Date Initiated: 04/23/2017 Target Resolution Date: 05/04/2017 Goal Status: Active Interventions: Provide education on orientation to the wound center Notes: ` Venous Leg Ulcer Nursing Diagnoses: Potential for venous Insuffiency (use before diagnosis confirmed) Goals: Patient will maintain optimal  edema control Date Initiated: 04/23/2017 Target Resolution Date: 05/04/2017 Goal Status: Active Interventions: Compression as ordered Treatment Activities: Therapeutic compression applied : 04/23/2017 Notes: ` Wound/Skin Impairment Corey West, Corey West ([id number]) Nursing Diagnoses: Impaired tissue integrity Goals: Ulcer/skin breakdown will have a volume reduction of 80% by week 12 Date Initiated: 04/23/2017 Target Resolution Date: 05/04/2017 Goal Status: Active Interventions: Assess ulceration(s) every visit Notes: Electronic Signature(s) Signed: 07/02/2017 5:08:57 PM By: Corey West Entered By: Corey West on 07/02/2017 15:44:39 Corey West ([id number]) -------------------------------------------------------------------------------- Pain Assessment Details Patient Name: Corey Cower A. Date of Service: 07/02/2017 3:15 PM Medical Record Number: [id number] Patient Account Number: [id number] Date of Birth/Sex: [birth year] (66 y.o. Male) Treating RN: Corey West Primary Care Lindy Garczynski: Corey West Other Clinician: Referring Ayanna Gheen: Corey West Treating Yavier Snider/Extender: Corey West in Treatment: 10 Active Problems Location of Pain Severity and Description of Pain Patient Has Paino No Site Locations Pain Management and Medication Current Pain Management: Electronic Signature(s) Signed: 07/02/2017 5:08:57 PM By: Corey West Entered By: Corey West on 07/02/2017 15:18:27 Corey West ([id number]) -------------------------------------------------------------------------------- Patient/Caregiver Education Details Patient Name: Corey Cower A. Date of Service: 07/02/2017 3:15 PM Medical Record Number: [id number] Patient Account Number: [id number] Date of Birth/Gender: 05-11-1951 (66 y.o. Male) Treating RN: Corey West Primary Care Physician: Corey West Other Clinician: Referring Physician: Lynett West Treating  Physician/Extender: Corey West in Treatment: 10 Education Assessment Education Provided To: Patient Education Topics Provided Wound/Skin Impairment: Handouts: Other: change dressing as ordered Methods: Demonstration, Explain/Verbal Responses: State content correctly Electronic Signature(s) Signed: 07/02/2017 5:08:57 PM By: Corey West Entered By: Corey West on 07/02/2017 15:50:46 Corey West ([id number]) -------------------------------------------------------------------------------- Wound Assessment Details Patient Name: Corey Cower A. Date of Service: 07/02/2017 3:15 PM Medical Record Number: [id number] Patient Account Number: [id number] Date of Birth/Sex: 05/11/1951 (66 y.o. Male) Treating RN: Corey West Primary Care Denica Web: Corey West Other Clinician: Referring Damauri Minion: Corey West Treating Myonna Chisom/Extender: Corey West in Treatment: 10 Wound Status Wound Number: 3 Primary Lymphedema Etiology: Wound Location: Right Lower Leg - Posterior Secondary Diabetic Wound/Ulcer of the Lower Wounding Event: Gradually Appeared Etiology: Extremity Date Acquired: 01/14/2017 Wound Open Weeks Of Treatment: 10 Status: Clustered Wound: No Comorbid Cataracts, Anemia, Sleep Apnea, History: Hypertension, Cirrhosis , Type II Diabetes Photos Photo Uploaded By: Corey West on 07/02/2017 17:06:01 Wound Measurements Length: (cm) 5 Width: (cm) 24 Depth: (cm) 0.1 Area: (cm) 94.248 Volume: (cm) 9.425 % Reduction in Area: -144.9% % Reduction in Volume: 75.5% Epithelialization: Medium (34-66%) Tunneling: No Undermining: No Wound Description Classification: Partial Thickness Wound Margin: Flat and Intact Exudate Amount: Large Exudate Type: Serous Exudate Color: amber Foul Odor After Cleansing: No Slough/Fibrino Yes Wound Bed Granulation Amount: Medium (34-66%) Exposed Structure Granulation Quality: Pink Fascia Exposed:  No Corey West, Corey A. ([id number]) Necrotic Amount: Medium (34-66%) Fat Layer (Subcutaneous Tissue) Exposed: No Necrotic Quality: Adherent Slough Tendon Exposed: No Muscle Exposed: No Joint Exposed: No  Bone Exposed: No Periwound Skin Texture Texture Color No Abnormalities Noted: No No Abnormalities Noted: No Callus: No Atrophie Blanche: No Crepitus: No Cyanosis: No Excoriation: No Ecchymosis: No Induration: No Erythema: No Rash: No Hemosiderin Staining: No Scarring: No Mottled: No Pallor: No Moisture Rubor: No No Abnormalities Noted: No Dry / Scaly: No Temperature / Pain Maceration: Yes Temperature: No Abnormality Wound Preparation Ulcer Cleansing: Rinsed/Irrigated with Saline Topical Anesthetic Applied: Other: lidocaine 4%, Treatment Notes Wound #3 (Right, Posterior Lower Leg) 1. Cleansed with: Clean wound with Normal Saline Cleanse wound with antibacterial soap and water 2. Anesthetic Topical Lidocaine 4% cream to wound bed prior to debridement 4. Dressing Applied: Other dressing (specify in notes) 5. Secondary Dressing Applied ABD Pad 7. Secured with Tape 3 Layer Compression System - Bilateral Notes unna to anchor, silvercel Electronic Signature(s) Signed: 07/02/2017 5:08:57 PM By: Corey West Entered By: Corey West on 07/02/2017 15:38:34 Corey West ([id number]) -------------------------------------------------------------------------------- Wound Assessment Details Patient Name: Corey Cower A. Date of Service: 07/02/2017 3:15 PM Medical Record Number: [id number] Patient Account Number: [id number] Date of Birth/Sex: April 02, 1951 (66 y.o. Male) Treating RN: Corey West Primary Care Audrick Lamoureaux: Corey West Other Clinician: Referring Leighton Luster: Corey West Treating Keonta Monceaux/Extender: Corey West in Treatment: 10 Wound Status Wound Number: 4 Primary Lymphedema Etiology: Wound Location: Right Lower Leg - Lateral Secondary  Diabetic Wound/Ulcer of the Lower Wounding Event: Gradually Appeared Etiology: Extremity Date Acquired: 01/07/2017 Wound Open Weeks Of Treatment: 10 Status: Clustered Wound: No Comorbid Cataracts, Anemia, Sleep Apnea, History: Hypertension, Cirrhosis , Type II Diabetes Photos Photo Uploaded By: Corey West on 07/02/2017 17:06:02 Wound Measurements Length: (cm) 1.8 Width: (cm) 9.5 Depth: (cm) 0.3 Area: (cm) 13.43 Volume: (cm) 4.029 % Reduction in Area: -1325.7% % Reduction in Volume: -2043.1% Epithelialization: None Tunneling: No Undermining: No Wound Description Full Thickness Without Exposed Classification: Support Structures Wound Margin: Flat and Intact Exudate Large Amount: Exudate Type: Serous Exudate Color: amber Foul Odor After Cleansing: No Slough/Fibrino Yes Wound Bed Corey West, Corey A. ([id number]) Granulation Amount: Medium (34-66%) Exposed Structure Necrotic Amount: Medium (34-66%) Fascia Exposed: No Necrotic Quality: Adherent Slough Fat Layer (Subcutaneous Tissue) Exposed: No Tendon Exposed: No Muscle Exposed: No Joint Exposed: No Bone Exposed: No Periwound Skin Texture Texture Color No Abnormalities Noted: No No Abnormalities Noted: No Callus: No Atrophie Blanche: No Crepitus: No Cyanosis: No Excoriation: No Ecchymosis: No Induration: No Erythema: No Rash: No Hemosiderin Staining: No Scarring: No Mottled: No Pallor: No Moisture Rubor: No No Abnormalities Noted: No Dry / Scaly: No Temperature / Pain Maceration: No Temperature: No Abnormality Wound Preparation Ulcer Cleansing: Rinsed/Irrigated with Saline Topical Anesthetic Applied: Other: lidocaine 4%, Treatment Notes Wound #4 (Right, Lateral Lower Leg) 1. Cleansed with: Clean wound with Normal Saline Cleanse wound with antibacterial soap and water 2. Anesthetic Topical Lidocaine 4% cream to wound bed prior to debridement 4. Dressing Applied: Other dressing (specify in  notes) 5. Secondary Dressing Applied ABD Pad 7. Secured with Tape 3 Layer Compression System - Bilateral Notes unna to anchor, silvercel Electronic Signature(s) Signed: 07/02/2017 5:08:57 PM By: Corey West Entered By: Corey West on 07/02/2017 15:38:14 Corey West ([id number]) Corey West, PHARISS ([id number]) -------------------------------------------------------------------------------- Wound Assessment Details Patient Name: DILLYN, MENNA A. Date of Service: 07/02/2017 3:15 PM Medical Record Number: [id number] Patient Account Number: [id number] Date of Birth/Sex: Jan 30, 1951 (66 y.o. Male) Treating RN: Corey West Primary Care Kashina Mecum: Corey West Other Clinician: Referring Merilynn Haydu: Corey West Treating Alyannah Sanks/Extender: Ricard Dillon Weeks in Treatment: 10  Wound Status Wound Number: 5 Primary Lymphedema Etiology: Wound Location: Left Lower Leg - Anterior Wound Open Wounding Event: Gradually Appeared Status: Date Acquired: 07/02/2017 Comorbid Cataracts, Anemia, Sleep Apnea, Weeks Of Treatment: 0 History: Hypertension, Cirrhosis , Type II Clustered Wound: Yes Diabetes Photos Photo Uploaded By: Corey West on 07/02/2017 17:06:17 Wound Measurements Length: (cm) 2 Width: (cm) 4.3 Depth: (cm) 0.1 Clustered Quantity: 2 Area: (cm) 6.754 Volume: (cm) 0.675 % Reduction in Area: 0% % Reduction in Volume: 0% Epithelialization: None Tunneling: No Undermining: No Wound Description Classification: Partial Thickness Wound Margin: Distinct, outline attached Exudate Amount: Large Exudate Type: Serous Exudate Color: amber Foul Odor After Cleansing: No Slough/Fibrino No Wound Bed Granulation Amount: Large (67-100%) Granulation Quality: Pink Necrotic Amount: None Present (0%) AMEAR, STROJNY ([id number]) Periwound Skin Texture Texture Color No Abnormalities Noted: No No Abnormalities Noted: No Moisture Temperature / Pain No Abnormalities  Noted: No Temperature: No Abnormality Maceration: Yes Tenderness on Palpation: Yes Wound Preparation Ulcer Cleansing: Rinsed/Irrigated with Saline Topical Anesthetic Applied: Other: lidocaine 4%, Treatment Notes Wound #5 (Left, Anterior Lower Leg) 1. Cleansed with: Clean wound with Normal Saline Cleanse wound with antibacterial soap and water 2. Anesthetic Topical Lidocaine 4% cream to wound bed prior to debridement 4. Dressing Applied: Other dressing (specify in notes) 5. Secondary Dressing Applied ABD Pad 7. Secured with Tape 3 Layer Compression System - Bilateral Notes unna to anchor, silvercel Electronic Signature(s) Signed: 07/02/2017 5:08:57 PM By: Corey West Entered By: Corey West on 07/02/2017 15:40:34 Corey West ([id number]) -------------------------------------------------------------------------------- [place] Details Patient Name: Corey Cower A. Date of Service: 07/02/2017 3:15 PM Medical Record Number: [id number] Patient Account Number: [id number] Date of Birth/Sex: [birth year] (66 y.o. Male) Treating RN: Corey West Primary Care Catricia Scheerer: Corey West Other Clinician: Referring Dystany Duffy: Corey West Treating Debra Calabretta/Extender: Corey West in Treatment: 10 Vital Signs Time Taken: 15:18 Temperature (F): 97.8 Height (in): 69 Pulse (bpm): 68 Weight (lbs): 305.4 Respiratory Rate (breaths/min): 20 Body Mass Index (BMI): 45.1 Blood Pressure (mmHg): 130/50 Reference Range: 80 - 120 mg / dl Electronic Signature(s) Signed: 07/02/2017 5:08:57 PM By: Corey West Entered By: Corey West on 07/02/2017 15:22:43

## 2017-07-08 ENCOUNTER — Encounter: Payer: Medicare Other | Admitting: Occupational Therapy

## 2017-07-09 ENCOUNTER — Encounter: Payer: Medicare Other | Admitting: Occupational Therapy

## 2017-07-09 ENCOUNTER — Encounter: Payer: Medicare Other | Admitting: Internal Medicine

## 2017-07-09 DIAGNOSIS — I87331 Chronic venous hypertension (idiopathic) with ulcer and inflammation of right lower extremity: Secondary | ICD-10-CM | POA: Diagnosis not present

## 2017-07-10 NOTE — Progress Notes (Signed)
Corey West ([id number]) Visit Report for 07/09/2017 HPI Details Patient Name: Corey West, PINN. Date of Service: 07/09/2017 3:15 PM Medical Record Number: [id number] Patient Account Number: [id number] Date of Birth/Sex: 1950-10-16 (66 y.o. Male) Treating RN: Ahmed Prima Primary Care Provider: Lynett Fish Other Clinician: Referring Provider: Lynett Fish Treating Provider/Extender: Tito Dine in Treatment: 11 History of Present Illness HPI Description: Pleasant 66 year old with a past medical history significant for morbid obesity, insulin- dependent type 2 diabetes (hemoglobin A1c 7.2 in January 2015), cirrhosis, and chronic kidney disease. He reports a left anterior calf ulcer since January 2015. He has had venous stasis ulcers in the past, which have healed spontaneously. No history of DVT. Does not regularly wear compression. Ambulating normally per his baseline. Recently completed a course of antibiotics. Treated with a Profore light and Coban 2 layer compression drainage, which both fell down and he cut off. Tolerated Coban lite compression bandage. Returns to clinic for followup. No complaints today. No pain at this time. No fever or chills. No open wounds at this time. He is having difficulty finding compression garments that fit him. 04/23/17; this is now 66 year old man who returns to our clinic for nonhealing wounds on the right lateral and right posterior calf. He tells me that they have been present for 6 months. In the setting of severe right greater than left lower extremity lymphedema likely secondary to chronic venous insufficiency. He has been followed in the lymphedema clinic at least since early June with compression wraps although these have not really helped and the patient is here for our review of this situation. The patient has had problems with edema in his legs for years. He has not worn compression. He is type II diabetic that sounds  poorly controlled. He also has obstructive sleep apnea and cirrhosis. He is occasional cigar smoker 04/30/17; patient is a 66 year old man who has severe bilateral lower extremity lymphedema stage III since his early 65s he tells me. This is no doubt secondary to chronic venous insufficiency. He was going to the lymphedema clinic who forwarded him here due to skin breakdown in the right leg. He has not previously worn compression stockings. He has a large area of denuded epithelium on the posterior leg. He has a more open wound on the right lateral leg 05/07/17 on evaluation today patient appears to be doing about the same his wounds are showing signs of improvement and looking fairly well. With that being said he is not having any severe discomfort just some mild aching due to the swelling and really this is worse on the left than the right and the right side is where he actually has his wounds. He has been tolerating the [employer] wraps without complication and states this is actually much better than the wraps that he was getting at the lymphedema clinic most recently. He is happy with these wraps. He has no nausea, vomiting, diarrhea and no fevers or chills. We are still waiting to hear back in regard to the lymphedema pumps which I do think would be beneficial for him even with the compression wraps he still has a significant amount of edema noted of the bilateral lower extremities. 05/15/17 on evaluation today patient's right lower extremity edema and weeping especially the posterior portion appears to be doing better. He still is having some issue here but fortunately it's minimal compared Corey West, Corey A. ([id number]) to previous weeks. He continues to tolerate the compression fairly well. Overall he  is pleased with how things are progressing. No fevers, chills, nausea, or vomiting noted at this time. And patient is having only minimal discomfort. 05/22/17; the medial wound on the right leg  actually looks considerably better and is epithelialized. He still has a small punched out area on the right lateral leg. Edema control is marginal end of the [employer]. We are working on getting him compression pumps 05/29/17; the patient's lymphedema pumps are apparently arriving tonight. In the meantime he took his Unna boots off last Sunday because the edema had progressed to the point that a word to titrate. Since then he has put nothing on his right leg and more specifically nothing on the right medial and lateral wounds 06/05/17; the patient has his lymphedema pumps and he is using them once a day on the right leg. There is a sizable reduction in the circumference of his leg although he arrived in clinic today somewhat short of breath. 07/02/17; since the patient was last in clinic he was admitted on 2 occasions. He was admitted from 9/12 through 9/13 with lactic acidosis weakness and felt to have left lower extremity cellulitis. Due to the elevated lactic acid level he was felt to have sepsis although this was ruled out and the lactic acidosis was felt secondary to his chronic cirrhosis. He was initially treated with IV vancomycin for cellulitis and redness of his left leg but then this was subsequently recognized to be secondary to chronic venous stasis and chronic lymphedema. When he left the hospital he had Unna boots placed bilaterally. He was readmitted on 9/26 through 9/28 with GI bleeding. His EGD showed Barrett's esophagus and gastritis. He was discharged with a hemoglobin of 8 to follow-up with GI as an outpatient. There was nothing more about his lower extremities during this short hospital stay. The patient has not been using his external compression pumps fearing fluid overload and shortness of breath although he feels better now that his hemoglobin is stabilized. He continues to have epithelial loss on his bilateral lower extremities. 07/09/17; patient has a large but superficial  area on the right posterior leg and on the left anterior leg. He tells me he is been using his external compression pumps once a day for the required hour. He states the pumps hurt his right ankle. Electronic Signature(s) Signed: 07/09/2017 4:33:30 PM By: Linton Ham MD Entered By: Linton Ham on 07/09/2017 16:29:31 Corey West ([id number]) -------------------------------------------------------------------------------- Physical Exam Details Patient Name: SAVAN, RUTA. Date of Service: 07/09/2017 3:15 PM Medical Record Number: [id number] Patient Account Number: [id number] Date of Birth/Sex: 1950-11-21 (66 y.o. Male) Treating RN: Ahmed Prima Primary Care Provider: Lynett Fish Other Clinician: Referring Provider: Lynett Fish Treating Provider/Extender: Tito Dine in Treatment: 11 Constitutional Patient is hypertensive.. Pulse regular and within target range for patient.Marland Kitchen Respirations regular, non-labored and within target range.. Temperature is normal and within the target range for the patient.Marland Kitchen appears in no distress. Eyes Conjunctivae clear. No discharge. Respiratory Respiratory effort is easy and symmetric bilaterally. Rate is normal at rest and on room air.. Bilateral breath sounds are clear and equal in all lobes with no wheezes, rales or rhonchi.. Cardiovascular Pedal pulses are palpable in both feet. Severe bilateral lymphedema not sure I see too much change in the size of the legs versus last week. Psychiatric No evidence of depression, anxiety, or agitation. Calm, cooperative, and communicative. Appropriate interactions and affect.. Notes Wound exam; right calf posteriorly left calf anteriorly. Large denuded  but superficial wounds. There is some surrounding maceration probably secondary to leaking edema fluid Electronic Signature(s) Signed: 07/09/2017 4:33:30 PM By: Linton Ham MD Entered By: Linton Ham on 07/09/2017  16:28:17 Corey West ([id number]) -------------------------------------------------------------------------------- Physician Orders Details Patient Name: HARROLD, FITCHETT A. Date of Service: 07/09/2017 3:15 PM Medical Record Number: [id number] Patient Account Number: [id number] Date of Birth/Sex: [birth year] (66 y.o. Male) Treating RN: Ahmed Prima Primary Care Provider: Lynett Fish Other Clinician: Referring Provider: Lynett Fish Treating Provider/Extender: Tito Dine in Treatment: 11 Verbal / Phone Orders: Yes Clinician: Carolyne Fiscal, Debi Read Back and Verified: Yes Diagnosis Coding Wound Cleansing Wound #3 Right,Posterior Lower Leg o Clean wound with Normal Saline. o Cleanse wound with mild soap and water Wound #4 Right,Lateral Lower Leg o Clean wound with Normal Saline. o Cleanse wound with mild soap and water Wound #5 Left,Anterior Lower Leg o Clean wound with Normal Saline. o Cleanse wound with mild soap and water Anesthetic Wound #3 Right,Posterior Lower Leg o Topical Lidocaine 4% cream applied to wound bed prior to debridement Wound #4 Right,Lateral Lower Leg o Topical Lidocaine 4% cream applied to wound bed prior to debridement Wound #5 Left,Anterior Lower Leg o Topical Lidocaine 4% cream applied to wound bed prior to debridement Primary Wound Dressing Wound #3 Right,Posterior Lower Leg o Aquacel Ag Wound #4 Right,Lateral Lower Leg o Aquacel Ag Wound #5 Left,Anterior Lower Leg o Aquacel Ag Secondary Dressing Wound #3 Right,Posterior Lower Leg o ABD pad Corey West, Corey West ([id number]) Wound #4 Right,Lateral Lower Leg o ABD pad Wound #5 Left,Anterior Lower Leg o ABD pad Dressing Change Frequency Wound #3 Right,Posterior Lower Leg o Change dressing every week Wound #4 Right,Lateral Lower Leg o Change dressing every week Wound #5 Left,Anterior Lower Leg o Change dressing every week Follow-up  Appointments Wound #3 Right,Posterior Lower Leg o Return Appointment in 1 week. Wound #4 Right,Lateral Lower Leg o Return Appointment in 1 week. Wound #5 Left,Anterior Lower Leg o Return Appointment in 1 week. Edema Control Wound #3 Right,Posterior Lower Leg o 3 Layer Compression System - Right Lower Extremity - unna to anchor o Compression Pump: Use compression pump on left lower extremity for 30 minutes, twice daily. Wound #4 Right,Lateral Lower Leg o 3 Layer Compression System - Right Lower Extremity - unna to anchor o Compression Pump: Use compression pump on left lower extremity for 30 minutes, twice daily. Wound #5 Left,Anterior Lower Leg o 3 Layer Compression System - Right Lower Extremity - unna to anchor o Compression Pump: Use compression pump on left lower extremity for 30 minutes, twice daily. Additional Orders / Instructions Wound #3 Right,Posterior Lower Leg o Increase protein intake. o Other: - Get Blood Sugars below [place] ([id number]) Wound #4 Right,Lateral Lower Leg o Increase protein intake. o Other: - Get Blood Sugars below 150 Wound #5 Left,Anterior Lower Leg o Increase protein intake. o Other: - Get Blood Sugars below 150 Electronic Signature(s) Signed: 07/09/2017 4:19:28 PM By: Alric Quan Signed: 07/09/2017 4:33:30 PM By: Linton Ham MD Entered By: Alric Quan on 07/09/2017 15:29:34 Corey West ([id number]) -------------------------------------------------------------------------------- Problem List Details Patient Name: AADIL, SUR A. Date of Service: 07/09/2017 3:15 PM Medical Record Number: [id number] Patient Account Number: [id number] Date of Birth/Sex: 1951/07/17 (66 y.o. Male) Treating RN: Ahmed Prima Primary Care Provider: Lynett Fish Other Clinician: Referring Provider: Lynett Fish Treating Provider/Extender: Tito Dine in Treatment: 11 Active  Problems ICD-10 Encounter Code Description Active Date Diagnosis I89.0 Lymphedema,  not elsewhere classified 04/23/2017 Yes L97.211 Non-pressure chronic ulcer of right calf limited to 04/23/2017 Yes breakdown of skin I87.331 Chronic venous hypertension (idiopathic) with ulcer and 04/23/2017 Yes inflammation of right lower extremity E11.622 Type 2 diabetes mellitus with other skin ulcer 04/23/2017 Yes I89.9 Noninfective disorder of lymphatic vessels and lymph 04/23/2017 Yes nodes, unspecified Inactive Problems Resolved Problems Electronic Signature(s) Signed: 07/09/2017 4:33:30 PM By: Linton Ham MD Entered By: Linton Ham on 07/09/2017 16:24:43 Corey West ([id number]) -------------------------------------------------------------------------------- Progress Note Details Patient Name: Corey Cower A. Date of Service: 07/09/2017 3:15 PM Medical Record Number: [id number] Patient Account Number: [id number] Date of Birth/Sex: 03-30-1951 (66 y.o. Male) Treating RN: Ahmed Prima Primary Care Provider: Lynett Fish Other Clinician: Referring Provider: Lynett Fish Treating Provider/Extender: Tito Dine in Treatment: 11 Subjective History of Present Illness (HPI) Pleasant 66 year old with a past medical history significant for morbid obesity, insulin-dependent type 2 diabetes (hemoglobin A1c 7.2 in January 2015), cirrhosis, and chronic kidney disease. He reports a left anterior calf ulcer since January 2015. He has had venous stasis ulcers in the past, which have healed spontaneously. No history of DVT. Does not regularly wear compression. Ambulating normally per his baseline. Recently completed a course of antibiotics. Treated with a Profore light and Coban 2 layer compression drainage, which both fell down and he cut off. Tolerated Coban lite compression bandage. Returns to clinic for followup. No complaints today. No pain at this time. No fever or chills. No  open wounds at this time. He is having difficulty finding compression garments that fit him. 04/23/17; this is now 66 year old man who returns to our clinic for nonhealing wounds on the right lateral and right posterior calf. He tells me that they have been present for 6 months. In the setting of severe right greater than left lower extremity lymphedema likely secondary to chronic venous insufficiency. He has been followed in the lymphedema clinic at least since early June with compression wraps although these have not really helped and the patient is here for our review of this situation. The patient has had problems with edema in his legs for years. He has not worn compression. He is type II diabetic that sounds poorly controlled. He also has obstructive sleep apnea and cirrhosis. He is occasional cigar smoker 04/30/17; patient is a 66 year old man who has severe bilateral lower extremity lymphedema stage III since his early 7s he tells me. This is no doubt secondary to chronic venous insufficiency. He was going to the lymphedema clinic who forwarded him here due to skin breakdown in the right leg. He has not previously worn compression stockings. He has a large area of denuded epithelium on the posterior leg. He has a more open wound on the right lateral leg 05/07/17 on evaluation today patient appears to be doing about the same his wounds are showing signs of improvement and looking fairly well. With that being said he is not having any severe discomfort just some mild aching due to the swelling and really this is worse on the left than the right and the right side is where he actually has his wounds. He has been tolerating the [employer] wraps without complication and states this is actually much better than the wraps that he was getting at the lymphedema clinic most recently. He is happy with these wraps. He has no nausea, vomiting, diarrhea and no fevers or chills. We are still waiting  to hear back in regard to the lymphedema pumps which  I do think would be beneficial for him even with the compression wraps he still has a significant amount of edema noted of the bilateral lower extremities. 05/15/17 on evaluation today patient's right lower extremity edema and weeping especially the posterior portion appears to be doing better. He still is having some issue here but fortunately it's minimal compared to previous weeks. He continues to tolerate the compression fairly well. Overall he is pleased with how things are progressing. No fevers, chills, nausea, or vomiting noted at this time. And patient is having only Corey West, Corey A. ([id number]) minimal discomfort. 05/22/17; the medial wound on the right leg actually looks considerably better and is epithelialized. He still has a small punched out area on the right lateral leg. Edema control is marginal end of the [employer]. We are working on getting him compression pumps 05/29/17; the patient's lymphedema pumps are apparently arriving tonight. In the meantime he took his Unna boots off last Sunday because the edema had progressed to the point that a word to titrate. Since then he has put nothing on his right leg and more specifically nothing on the right medial and lateral wounds 06/05/17; the patient has his lymphedema pumps and he is using them once a day on the right leg. There is a sizable reduction in the circumference of his leg although he arrived in clinic today somewhat short of breath. 07/02/17; since the patient was last in clinic he was admitted on 2 occasions. He was admitted from 9/12 through 9/13 with lactic acidosis weakness and felt to have left lower extremity cellulitis. Due to the elevated lactic acid level he was felt to have sepsis although this was ruled out and the lactic acidosis was felt secondary to his chronic cirrhosis. He was initially treated with IV vancomycin for cellulitis and redness of his left leg but  then this was subsequently recognized to be secondary to chronic venous stasis and chronic lymphedema. When he left the hospital he had Unna boots placed bilaterally. He was readmitted on 9/26 through 9/28 with GI bleeding. His EGD showed Barrett's esophagus and gastritis. He was discharged with a hemoglobin of 8 to follow-up with GI as an outpatient. There was nothing more about his lower extremities during this short hospital stay. The patient has not been using his external compression pumps fearing fluid overload and shortness of breath although he feels better now that his hemoglobin is stabilized. He continues to have epithelial loss on his bilateral lower extremities. 07/09/17; patient has a large but superficial area on the right posterior leg and on the left anterior leg. He tells me he is been using his external compression pumps once a day for the required hour. He states the pumps hurt his right ankle. Objective Constitutional Patient is hypertensive.. Pulse regular and within target range for patient.Marland Kitchen Respirations regular, non-labored and within target range.. Temperature is normal and within the target range for the patient.Marland Kitchen appears in no distress. Vitals Time Taken: 3:01 PM, Height: 69 in, Weight: 305.4 lbs, BMI: 45.1, Temperature: 98.2 F, Pulse: 81 bpm, Respiratory Rate: 20 breaths/min, Blood Pressure: 155/52 mmHg. Eyes Conjunctivae clear. No discharge. Corey West, Corey West ([id number]) Respiratory Respiratory effort is easy and symmetric bilaterally. Rate is normal at rest and on room air.. Bilateral breath sounds are clear and equal in all lobes with no wheezes, rales or rhonchi.. Cardiovascular Pedal pulses are palpable in both feet. Severe bilateral lymphedema not sure I see too much change in the size of the legs  versus last week. Psychiatric No evidence of depression, anxiety, or agitation. Calm, cooperative, and communicative. Appropriate interactions and  affect.. General Notes: Wound exam; right calf posteriorly left calf anteriorly. Large denuded but superficial wounds. There is some surrounding maceration probably secondary to leaking edema fluid Integumentary (Hair, Skin) Wound #3 status is Open. Original cause of wound was Gradually Appeared. The wound is located on the Right,Posterior Lower Leg. The wound measures 7cm length x 24cm width x 0.1cm depth; 131.947cm^2 area and 13.195cm^3 volume. There is no tunneling or undermining noted. There is a large amount of serous drainage noted. The wound margin is flat and intact. There is medium (34-66%) pink granulation within the wound bed. There is a medium (34-66%) amount of necrotic tissue within the wound bed including Adherent Slough. The periwound skin appearance exhibited: Maceration. The periwound skin appearance did not exhibit: Callus, Crepitus, Excoriation, Induration, Rash, Scarring, Dry/Scaly, Atrophie Blanche, Cyanosis, Ecchymosis, Hemosiderin Staining, Mottled, Pallor, Rubor, Erythema. Periwound temperature was noted as No Abnormality. Wound #4 status is Open. Original cause of wound was Gradually Appeared. The wound is located on the Right,Lateral Lower Leg. The wound measures 1.8cm length x 9.5cm width x 0.3cm depth; 13.43cm^2 area and 4.029cm^3 volume. There is no tunneling or undermining noted. There is a large amount of serous drainage noted. The wound margin is flat and intact. There is medium (34-66%) granulation within the wound bed. There is a medium (34-66%) amount of necrotic tissue within the wound bed including Adherent Slough. The periwound skin appearance exhibited: Maceration. The periwound skin appearance did not exhibit: Callus, Crepitus, Excoriation, Induration, Rash, Scarring, Dry/Scaly, Atrophie Blanche, Cyanosis, Ecchymosis, Hemosiderin Staining, Mottled, Pallor, Rubor, Erythema. Periwound temperature was noted as No Abnormality. Wound #5 status is Open.  Original cause of wound was Gradually Appeared. The wound is located on the Left,Anterior Lower Leg. The wound measures 9.5cm length x 4cm width x 0.1cm depth; 29.845cm^2 area and 2.985cm^3 volume. There is no tunneling or undermining noted. There is a large amount of serous drainage noted. The wound margin is distinct with the outline attached to the wound base. There is medium (34-66%) pink granulation within the wound bed. There is a medium (34-66%) amount of necrotic tissue within the wound bed including Adherent Slough. The periwound skin appearance exhibited: Maceration. Periwound temperature was noted as No Abnormality. The periwound has tenderness on palpation. 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 Cleanse wound with mild soap and water Wound #4 Right,Lateral Lower Leg: Clean wound with Normal Saline. Cleanse wound with mild soap and water Wound #5 Left,Anterior Lower Leg: Clean wound with Normal Saline. Cleanse wound with mild soap and water Anesthetic: Wound #3 Right,Posterior Lower Leg: Topical Lidocaine 4% cream applied to wound bed prior to debridement Wound #4 Right,Lateral Lower Leg: Topical Lidocaine 4% cream applied to wound bed prior to debridement Wound #5 Left,Anterior Lower Leg: Topical Lidocaine 4% cream applied to wound bed prior to debridement Primary Wound Dressing: Wound #3 Right,Posterior Lower Leg: Aquacel Ag Wound #4 Right,Lateral Lower Leg: Aquacel Ag Wound #5 Left,Anterior  Lower Leg: Aquacel Ag Secondary Dressing: Wound #3  Right,Posterior Lower Leg: ABD pad Wound #4 Right,Lateral Lower Leg: ABD pad Wound #5 Left,Anterior Lower Leg: ABD pad Dressing Change Frequency: Wound #3 Right,Posterior Lower Leg: Change dressing every week Corey West, Corey West ([id number]) Wound #4 Right,Lateral Lower Leg: Change dressing every week Wound #5 Left,Anterior Lower Leg: Change dressing every week Follow-up Appointments: Wound #3 Right,Posterior Lower Leg: Return Appointment in 1 week. Wound #4 Right,Lateral Lower Leg: Return Appointment in 1 week. Wound #5 Left,Anterior Lower Leg: Return Appointment in 1 week. Edema Control: Wound #3 Right,Posterior Lower Leg: 3 Layer Compression System - Right Lower Extremity - unna to anchor Compression Pump: Use compression pump on left lower extremity for 30 minutes, twice daily. Wound #4 Right,Lateral Lower Leg: 3 Layer Compression System - Right Lower Extremity - unna to anchor Compression Pump: Use compression pump on left lower extremity for 30 minutes, twice daily. Wound #5 Left,Anterior Lower Leg: 3 Layer Compression System - Right Lower Extremity - unna to anchor Compression Pump: Use compression pump on left lower extremity for 30 minutes, twice daily. Additional Orders / Instructions: Wound #3 Right,Posterior Lower Leg: Increase protein intake. Other: - Get Blood Sugars below 150 Wound #4 Right,Lateral Lower Leg: Increase protein intake. Other: - Get Blood Sugars below 150 Wound #5 Left,Anterior Lower Leg: Increase protein intake. Other: - Get Blood Sugars below 150 continue with Aquacel AG /ABDs and 3 layer compression continue with external compression pumps Electronic Signature(s) Signed: 07/09/2017 4:33:30 PM By: Linton Ham MD Entered By: Linton Ham on 07/09/2017 16:30:51 Corey West ([id number]) -------------------------------------------------------------------------------- [place]  Details Patient Name: Corey Cower A. Date of Service: 07/09/2017 Medical Record Number: [id number] Patient Account Number: [id number] Date of Birth/Sex: 11/18/1950 (66 y.o. 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 Physician Procedures CPT4: Description Modifier Quantity Code [id number] [id number] - WC PHYS LEVEL 3 - EST PT 1 ICD-10 Description Diagnosis L97.211 Non-pressure chronic ulcer of right calf limited to breakdown of skin L97.821 Non-pressure chronic ulcer of other part of left lower  leg limited to breakdown of skin Electronic Signature(s) Signed: 07/09/2017 4:33:30 PM By: Linton Ham MD Previous Signature: 07/09/2017 4:19:28 PM Version By: Alric Quan Entered By: Linton Ham on 07/09/2017 16:31:32

## 2017-07-11 ENCOUNTER — Encounter: Payer: Medicare Other | Admitting: Occupational Therapy

## 2017-07-11 NOTE — Progress Notes (Signed)
EDMUNDO, TEDESCO ([id number]) Visit Report for 07/09/2017 Arrival Information Details Patient Name: MORTIMER, BAIR. Date of Service: 07/09/2017 3:15 PM Medical Record Number: [id number] Patient Account Number: [id number] Date of Birth/Sex: 1950-11-11 (66 y.o. Male) Treating RN: Ahmed Prima Primary Care Hall Birchard: Lynett Fish Other Clinician: Referring Liya Strollo: Lynett Fish Treating [place] Ice/Extender: Tito Dine in Treatment: 11 Visit Information History Since Last Visit All ordered tests and consults were completed: No Patient Arrived: Kasandra Knudsen Added or deleted any medications: No Arrival Time: 14:58 Any new allergies or adverse reactions: No Accompanied By: self Had a fall or experienced change in No Transfer Assistance: EasyP[employer] activities of daily living that may affect Patient Lift risk of falls: Patient Identification Verified: Yes Signs or symptoms of abuse/neglect since last No Secondary Verification Process Yes visito Completed: Hospitalized since last visit: No Patient Requires Transmission- No Has Dressing in Place as Prescribed: Yes Based Precautions: Has Compression in Place as Prescribed: Yes Patient Has Alerts: No Pain Present Now: No Electronic Signature(s) Signed: 07/09/2017 4:19:28 PM By: Alric Quan Entered By: Alric Quan on 07/09/2017 14:59:05 [name] ([id number]) -------------------------------------------------------------------------------- Encounter Discharge Information Details Patient Name: [name] Date of Service: 07/09/2017 3:15 PM Medical Record Number: [id number] Patient Account Number: [id number] Date of Birth/Sex: 1951-02-21 (66 y.o. Male) Treating RN: Ahmed Prima Primary Care Amyiah Gaba: Lynett Fish Other Clinician: Referring Dayshia Ballinas: Lynett Fish Treating Lillian Tigges/Extender: Tito Dine in Treatment: 11 Encounter Discharge Information Items Discharge Pain Level: 0 Discharge  Condition: Stable Ambulatory Status: Cane Discharge Destination: Home Transportation: Private Auto Accompanied By: self Schedule Follow-up Appointment: Yes Medication Reconciliation completed No and provided to Patient/Care Zoe Goonan: Provided on Clinical Summary of Care: 07/09/2017 Form Type Recipient Paper Patient [name] Signature(s) Signed: 07/10/2017 8:44:55 AM By: Ruthine Dose Entered By: Ruthine Dose on 07/09/2017 15:46:41 [name] ([id number]) -------------------------------------------------------------------------------- Lower Extremity Assessment Details Patient Name: [name] Date of Service: 07/09/2017 3:15 PM Medical Record Number: [id number] Patient Account Number: [id number] Date of Birth/Sex: 07/28/1951 (66 y.o. Male) Treating RN: Ahmed Prima Primary Care Kiwan Gadsden: Lynett Fish Other Clinician: Referring Seng Fouts: Lynett Fish Treating Terena Bohan/Extender: Tito Dine in Treatment: 11 Edema Assessment Assessed: [Left: No] [Right: No] E[Left: dema] [Right: :] Calf Left: Right: Point of Measurement: 32 cm From Medial Instep 47.2 cm 49.5 cm Ankle Left: Right: Point of Measurement: 13 cm From Medial Instep 30.2 cm 30.2 cm Vascular Assessment Pulses: Dorsalis Pedis Palpable: [Left:Yes] [Right:Yes] Posterior Tibial Extremity colors, hair growth, and conditions: Extremity Color: [Left:Hyperpigmented] [Right:Hyperpigmented] Temperature of Extremity: [Left:Warm] [Right:Warm] Capillary Refill: [Left:< 3 seconds] [Right:< 3 seconds] Toe Nail Assessment Left: Right: Thick: No No Discolored: No No Deformed: No No Improper Length and Hygiene: No No Electronic Signature(s) Signed: 07/09/2017 4:19:28 PM By: Alric Quan Entered By: Alric Quan on 07/09/2017 15:19:43 [name] ([id number]) -------------------------------------------------------------------------------- Multi Wound Chart Details Patient Name:  [name] Date of Service: 07/09/2017 3:15 PM Medical Record Number: [id number] Patient Account Number: [id number] Date of Birth/Sex: 10/18/1950 (66 y.o. Male) Treating RN: Ahmed Prima Primary Care Jaxan Michel: Lynett Fish Other Clinician: Referring Hollis Tuller: Lynett Fish Treating Shatara Stanek/Extender: Tito Dine in Treatment: 11 Vital Signs Height(in): 69 Pulse(bpm): 81 Weight(lbs): 305.4 Blood Pressure 155/52 (mmHg): Body Mass Index(BMI): 45 Temperature(F): 98.2 Respiratory Rate 20 (breaths/min): Photos: Wound Location: Right Lower Leg - Right Lower Leg - Lateral Left Lower Leg - Anterior Posterior Wounding Event: Gradually Appeared Gradually Appeared Gradually Appeared Primary Etiology: Lymphedema  Lymphedema Lymphedema Secondary Etiology: Diabetic Wound/Ulcer of Diabetic Wound/Ulcer of N/A the Lower Extremity the Lower Extremity Comorbid History: Cataracts, Anemia, Sleep Cataracts, Anemia, Sleep Cataracts, Anemia, Sleep Apnea, Hypertension, Apnea, Hypertension, Apnea, Hypertension, Cirrhosis , Type II Cirrhosis , Type II Cirrhosis , Type II Diabetes Diabetes Diabetes Date Acquired: 01/14/2017 01/07/2017 07/02/2017 Weeks of Treatment: 11 11 1  Wound Status: Open Open Open Clustered Wound: No No Yes Clustered Quantity: N/A N/A 2 Measurements L x W x D 7x24x0.1 1.8x9.5x0.3 9.5x4x0.1 (cm) Area (cm) : 131.947 13.43 29.845 Volume (cm) : 13.195 4.029 2.985 % Reduction in Area: -242.90% -1325.70% -341.90% % Reduction in Volume: 65.70% -2043.10% -342.20% Classification: Partial Thickness Partial Thickness LARAMIE, MEISSNER ([id number]) Full Thickness Without Exposed Support Structures Exudate Amount: Large Large Large Exudate Type: Serous Serous Serous Exudate Color: amber amber amber Wound Margin: Flat and Intact Flat and Intact Distinct, outline attached Granulation Amount: Medium (34-66%) Medium (34-66%) Medium (34-66%) Granulation Quality: Pink N/A  Pink Necrotic Amount: Medium (34-66%) Medium (34-66%) Medium (34-66%) Exposed Structures: Fascia: No Fascia: No N/A Fat Layer (Subcutaneous Fat Layer (Subcutaneous Tissue) Exposed: No Tissue) Exposed: No Tendon: No Tendon: No Muscle: No Muscle: No Joint: No Joint: No Bone: No Bone: No Epithelialization: Medium (34-66%) None None Periwound Skin Texture: Excoriation: No Excoriation: No No Abnormalities Noted Induration: No Induration: No Callus: No Callus: No Crepitus: No Crepitus: No Rash: No Rash: No Scarring: No Scarring: No Periwound Skin Maceration: Yes Maceration: Yes Maceration: Yes Moisture: Dry/Scaly: No Dry/Scaly: No Periwound Skin Color: Atrophie Blanche: No Atrophie Blanche: No No Abnormalities Noted Cyanosis: No Cyanosis: No Ecchymosis: No Ecchymosis: No Erythema: No Erythema: No Hemosiderin Staining: No Hemosiderin Staining: No Mottled: No Mottled: No Pallor: No Pallor: No Rubor: No Rubor: No Temperature: No Abnormality No Abnormality No Abnormality Tenderness on No No Yes Palpation: Wound Preparation: Ulcer Cleansing: Ulcer Cleansing: Ulcer Cleansing: Rinsed/Irrigated with Rinsed/Irrigated with Rinsed/Irrigated with Saline Saline Saline Topical Anesthetic Topical Anesthetic Topical Anesthetic Applied: Other: lidocaine Applied: Other: lidocaine Applied: Other: lidocaine 4% 4% 4% Treatment Notes Wound #3 (Right, Posterior Lower Leg) 1. Cleansed with: Clean wound with Normal Saline UNNAMED, ZEIEN A. ([id number]) Cleanse wound with antibacterial soap and water 2. Anesthetic Topical Lidocaine 4% cream to wound bed prior to debridement 4. Dressing Applied: Aquacel Ag 5. Secondary Dressing Applied ABD Pad 7. Secured with Tape 3 Layer Compression System - Bilateral Notes unna to anchor Wound #4 (Right, Lateral Lower Leg) 1. Cleansed with: Clean wound with Normal Saline Cleanse wound with antibacterial soap and water 2.  Anesthetic Topical Lidocaine 4% cream to wound bed prior to debridement 4. Dressing Applied: Aquacel Ag 5. Secondary Dressing Applied ABD Pad 7. Secured with Tape 3 Layer Compression System - Bilateral Notes unna to anchor Wound #5 (Left, Anterior Lower Leg) 1. Cleansed with: Clean wound with Normal Saline Cleanse wound with antibacterial soap and water 2. Anesthetic Topical Lidocaine 4% cream to wound bed prior to debridement 4. Dressing Applied: Aquacel Ag 5. Secondary Dressing Applied ABD Pad 7. Secured with Tape 3 Layer Compression System - Bilateral Notes unna to anchor TALLON, GERTZ ([id number]) Electronic Signature(s) Signed: 07/09/2017 4:33:30 PM By: Linton Ham MD Previous Signature: 07/09/2017 4:19:28 PM Version By: Alric Quan Entered By: Linton Ham on 07/09/2017 16:24:56 [name] ([id number]) -------------------------------------------------------------------------------- [place] Details Patient Name: ALVIA, TORY A. Date of Service: 07/09/2017 3:15 PM Medical Record Number: [id number] Patient Account Number: [id number] Date of Birth/Sex: [birth year] (66 y.o. Male) Treating RN: Ahmed Prima Primary Care Lovinia Snare:  Lynett Fish Other Clinician: Referring Kaleisha Bhargava: Lynett Fish Treating Zephyr Ridley/Extender: Tito Dine in Treatment: 11 Active Inactive ` Orientation to the Wound Care Program Nursing Diagnoses: Knowledge deficit related to the wound healing center program Goals: Patient/caregiver will verbalize understanding of the [place] Program Date Initiated: 04/23/2017 Target Resolution Date: 05/04/2017 Goal Status: Active Interventions: Provide education on orientation to the wound center Notes: ` Venous Leg Ulcer Nursing Diagnoses: Potential for venous Insuffiency (use before diagnosis confirmed) Goals: Patient will maintain optimal edema control Date Initiated:  04/23/2017 Target Resolution Date: 05/04/2017 Goal Status: Active Interventions: Compression as ordered Treatment Activities: Therapeutic compression applied : 04/23/2017 Notes: ` Wound/Skin Impairment [name], WEIGHTMAN ([id number]) Nursing Diagnoses: Impaired tissue integrity Goals: Ulcer/skin breakdown will have a volume reduction of 80% by week 12 Date Initiated: 04/23/2017 Target Resolution Date: 05/04/2017 Goal Status: Active Interventions: Assess ulceration(s) every visit Notes: Electronic Signature(s) Signed: 07/09/2017 4:19:28 PM By: Alric Quan Entered By: Alric Quan on 07/09/2017 15:19:47 [name] ([id number]) -------------------------------------------------------------------------------- Pain Assessment Details Patient Name: [name] Date of Service: 07/09/2017 3:15 PM Medical Record Number: [id number] Patient Account Number: [id number] Date of Birth/Sex: 1951-05-09 (66 y.o. Male) Treating RN: Ahmed Prima Primary Care Leisl Spurrier: Lynett Fish Other Clinician: Referring Eleny Cortez: Lynett Fish Treating Dillyn Menna/Extender: Tito Dine in Treatment: 11 Active Problems Location of Pain Severity and Description of Pain Patient Has Paino No Site Locations Pain Management and Medication Current Pain Management: Electronic Signature(s) Signed: 07/09/2017 4:19:28 PM By: Alric Quan Entered By: Alric Quan on 07/09/2017 14:59:18 [name] ([id number]) -------------------------------------------------------------------------------- Patient/Caregiver Education Details Patient Name: [name] Date of Service: 07/09/2017 3:15 PM Medical Record Number: [id number] Patient Account Number: [id number] Date of Birth/Gender: 08-Feb-1951 (66 y.o. Male) Treating RN: Ahmed Prima Primary Care Physician: Lynett Fish Other Clinician: Referring Physician: Lynett Fish Treating Physician/Extender: Tito Dine in  Treatment: 11 Education Assessment Education Provided To: Patient Education Topics Provided Wound/Skin Impairment: Handouts: Other: do not get wraps wet Methods: Demonstration, Explain/Verbal Responses: State content correctly Electronic Signature(s) Signed: 07/09/2017 4:19:28 PM By: Alric Quan Entered By: Alric Quan on 07/09/2017 15:21:57 [name] ([id number]) -------------------------------------------------------------------------------- Wound Assessment Details Patient Name: [name] Date of Service: 07/09/2017 3:15 PM Medical Record Number: [id number] Patient Account Number: [id number] Date of Birth/Sex: 04-03-1951 (66 y.o. Male) Treating RN: Ahmed Prima Primary Care Verlaine Embry: Lynett Fish Other Clinician: Referring Shericka Johnstone: Lynett Fish Treating Analiah Drum/Extender: Tito Dine in Treatment: 11 Wound Status Wound Number: 3 Primary Lymphedema Etiology: Wound Location: Right Lower Leg - Posterior Secondary Diabetic Wound/Ulcer of the Lower Wounding Event: Gradually Appeared Etiology: Extremity Date Acquired: 01/14/2017 Wound Open Weeks Of Treatment: 11 Status: Clustered Wound: No Comorbid Cataracts, Anemia, Sleep Apnea, History: Hypertension, Cirrhosis , Type II Diabetes Photos Photo Uploaded By: Alric Quan on 07/09/2017 16:07:57 Wound Measurements Length: (cm) 7 Width: (cm) 24 Depth: (cm) 0.1 Area: (cm) 131.947 Volume: (cm) 13.195 % Reduction in Area: -242.9% % Reduction in Volume: 65.7% Epithelialization: Medium (34-66%) Tunneling: No Undermining: No Wound Description Classification: Partial Thickness Wound Margin: Flat and Intact Exudate Amount: Large Exudate Type: Serous Exudate Color: amber Foul Odor After Cleansing: No Slough/Fibrino Yes Wound Bed Granulation Amount: Medium (34-66%) Exposed Structure Granulation Quality: Pink Fascia Exposed: No NORMON, PETTIJOHN A. ([id number]) Necrotic Amount:  Medium (34-66%) Fat Layer (Subcutaneous Tissue) Exposed: No Necrotic Quality: Adherent Slough Tendon Exposed: No Muscle Exposed: No Joint Exposed: No Bone Exposed: No Periwound Skin  Texture Texture Color No Abnormalities Noted: No No Abnormalities Noted: No Callus: No Atrophie Blanche: No Crepitus: No Cyanosis: No Excoriation: No Ecchymosis: No Induration: No Erythema: No Rash: No Hemosiderin Staining: No Scarring: No Mottled: No Pallor: No Moisture Rubor: No No Abnormalities Noted: No Dry / Scaly: No Temperature / Pain Maceration: Yes Temperature: No Abnormality Wound Preparation Ulcer Cleansing: Rinsed/Irrigated with Saline Topical Anesthetic Applied: Other: lidocaine 4%, Treatment Notes Wound #3 (Right, Posterior Lower Leg) 1. Cleansed with: Clean wound with Normal Saline Cleanse wound with antibacterial soap and water 2. Anesthetic Topical Lidocaine 4% cream to wound bed prior to debridement 4. Dressing Applied: Aquacel Ag 5. Secondary Dressing Applied ABD Pad 7. Secured with Tape 3 Layer Compression System - Bilateral Notes unna to anchor Electronic Signature(s) Signed: 07/09/2017 4:19:28 PM By: Alric Quan Entered By: Alric Quan on 07/09/2017 15:17:34 [name] ([id number]) -------------------------------------------------------------------------------- Wound Assessment Details Patient Name: DAIRE, OKIMOTO A. Date of Service: 07/09/2017 3:15 PM Medical Record Number: [id number] Patient Account Number: [id number] Date of Birth/Sex: 08-16-1951 (66 y.o. Male) Treating RN: Ahmed Prima Primary Care Ezell Poke: Lynett Fish Other Clinician: Referring Beyounce Dickens: Lynett Fish Treating Luka Reisch/Extender: Tito Dine in Treatment: 11 Wound Status Wound Number: 4 Primary Lymphedema Etiology: Wound Location: Right Lower Leg - Lateral Secondary Diabetic Wound/Ulcer of the Lower Wounding Event: Gradually Appeared Etiology:  Extremity Date Acquired: 01/07/2017 Wound Open Weeks Of Treatment: 11 Status: Clustered Wound: No Comorbid Cataracts, Anemia, Sleep Apnea, History: Hypertension, Cirrhosis , Type II Diabetes Photos Photo Uploaded By: Alric Quan on 07/09/2017 16:07:58 Wound Measurements Length: (cm) 1.8 Width: (cm) 9.5 Depth: (cm) 0.3 Area: (cm) 13.43 Volume: (cm) 4.029 % Reduction in Area: -1325.7% % Reduction in Volume: -2043.1% Epithelialization: None Tunneling: No Undermining: No Wound Description Full Thickness Without Exposed Classification: Support Structures Wound Margin: Flat and Intact Exudate Large Amount: Exudate Type: Serous Exudate Color: amber Foul Odor After Cleansing: No Slough/Fibrino Yes Wound Bed TARIQ, PERNELL A. ([id number]) Granulation Amount: Medium (34-66%) Exposed Structure Necrotic Amount: Medium (34-66%) Fascia Exposed: No Necrotic Quality: Adherent Slough Fat Layer (Subcutaneous Tissue) Exposed: No Tendon Exposed: No Muscle Exposed: No Joint Exposed: No Bone Exposed: No Periwound Skin Texture Texture Color No Abnormalities Noted: No No Abnormalities Noted: No Callus: No Atrophie Blanche: No Crepitus: No Cyanosis: No Excoriation: No Ecchymosis: No Induration: No Erythema: No Rash: No Hemosiderin Staining: No Scarring: No Mottled: No Pallor: No Moisture Rubor: No No Abnormalities Noted: No Dry / Scaly: No Temperature / Pain Maceration: Yes Temperature: No Abnormality Wound Preparation Ulcer Cleansing: Rinsed/Irrigated with Saline Topical Anesthetic Applied: Other: lidocaine 4%, Treatment Notes Wound #4 (Right, Lateral Lower Leg) 1. Cleansed with: Clean wound with Normal Saline Cleanse wound with antibacterial soap and water 2. Anesthetic Topical Lidocaine 4% cream to wound bed prior to debridement 4. Dressing Applied: Aquacel Ag 5. Secondary Dressing Applied ABD Pad 7. Secured with Tape 3 Layer Compression System -  Bilateral Notes unna to anchor Electronic Signature(s) Signed: 07/09/2017 4:19:28 PM By: Alric Quan Entered By: Alric Quan on 07/09/2017 15:16:58 TYION, BOYLEN ([id number]) JONNATHAN, BIRMAN ([id number]) -------------------------------------------------------------------------------- Wound Assessment Details Patient Name: TERRANCE, USERY A. Date of Service: 07/09/2017 3:15 PM Medical Record Number: [id number] Patient Account Number: [id number] Date of Birth/Sex: 12-Sep-1951 (66 y.o. Male) Treating RN: Ahmed Prima Primary Care Yanis Larin: Lynett Fish Other Clinician: Referring Zarin Hagmann: Lynett Fish Treating Kecia Swoboda/Extender: Ricard Dillon Weeks in Treatment: 11 Wound Status Wound Number: 5 Primary Lymphedema Etiology: Wound Location: Left Lower Leg -  Anterior Wound Open Wounding Event: Gradually Appeared Status: Date Acquired: 07/02/2017 Comorbid Cataracts, Anemia, Sleep Apnea, Weeks Of Treatment: 1 History: Hypertension, Cirrhosis , Type II Clustered Wound: Yes Diabetes Photos Photo Uploaded By: Alric Quan on 07/09/2017 16:00:29 Wound Measurements Length: (cm) 9.5 Width: (cm) 4 Depth: (cm) 0.1 Clustered Quantity: 2 Area: (cm) 29.845 Volume: (cm) 2.985 % Reduction in Area: -341.9% % Reduction in Volume: -342.2% Epithelialization: None Tunneling: No Undermining: No Wound Description Classification: Partial Thickness Wound Margin: Distinct, outline attached Exudate Amount: Large Exudate Type: Serous Exudate Color: amber Foul Odor After Cleansing: No Slough/Fibrino Yes Wound Bed Granulation Amount: Medium (34-66%) Granulation Quality: Pink Necrotic Amount: Medium (34-66%) LEEANDRE, NORDLING ([id number]) Necrotic Quality: Adherent Slough Periwound Skin Texture Texture Color No Abnormalities Noted: No No Abnormalities Noted: No Moisture Temperature / Pain No Abnormalities Noted: No Temperature: No Abnormality Maceration: Yes Tenderness  on Palpation: Yes Wound Preparation Ulcer Cleansing: Rinsed/Irrigated with Saline Topical Anesthetic Applied: Other: lidocaine 4%, Treatment Notes Wound #5 (Left, Anterior Lower Leg) 1. Cleansed with: Clean wound with Normal Saline Cleanse wound with antibacterial soap and water 2. Anesthetic Topical Lidocaine 4% cream to wound bed prior to debridement 4. Dressing Applied: Aquacel Ag 5. Secondary Dressing Applied ABD Pad 7. Secured with Tape 3 Layer Compression System - Bilateral Notes unna to anchor Electronic Signature(s) Signed: 07/09/2017 4:19:28 PM By: Alric Quan Entered By: Alric Quan on 07/09/2017 15:16:33 [name] ([id number]) -------------------------------------------------------------------------------- [place] Details Patient Name: [name] Date of Service: 07/09/2017 3:15 PM Medical Record Number: [id number] Patient Account Number: [id number] Date of Birth/Sex: Mar 01, 1951 (66 y.o. Male) Treating RN: Ahmed Prima Primary Care Twanisha Foulk: Lynett Fish Other Clinician: Referring Javiana Anwar: Lynett Fish Treating Vennela Jutte/Extender: Tito Dine in Treatment: 11 Vital Signs Time Taken: 15:01 Temperature (F): 98.2 Height (in): 69 Pulse (bpm): 81 Weight (lbs): 305.4 Respiratory Rate (breaths/min): 20 Body Mass Index (BMI): 45.1 Blood Pressure (mmHg): 155/52 Reference Range: 80 - 120 mg / dl Electronic Signature(s) Signed: 07/09/2017 4:19:28 PM By: Alric Quan Entered By: Alric Quan on 07/09/2017 15:02:14

## 2017-07-15 ENCOUNTER — Encounter
Admission: RE | Admit: 2017-07-15 | Discharge: 2017-07-15 | Disposition: A | Discharge status: Home / Self Care | Payer: Medicare Other | Source: Ambulatory Visit | Attending: Unknown Physician Specialty | Admitting: Unknown Physician Specialty

## 2017-07-15 ENCOUNTER — Encounter: Payer: Medicare Other | Admitting: Occupational Therapy

## 2017-07-15 DIAGNOSIS — D649 Anemia, unspecified: Secondary | ICD-10-CM | POA: Insufficient documentation

## 2017-07-15 LAB — PREPARE RBC (CROSSMATCH)

## 2017-07-15 LAB — HEMOGLOBIN: HEMOGLOBIN: 6.8 g/dL — AB (ref 13.0–18.0)

## 2017-07-16 ENCOUNTER — Encounter
Admission: RE | Admit: 2017-07-16 | Discharge: 2017-07-16 | Disposition: A | Discharge status: Home / Self Care | Payer: Medicare Other | Source: Ambulatory Visit | Attending: Unknown Physician Specialty | Admitting: Unknown Physician Specialty

## 2017-07-16 ENCOUNTER — Ambulatory Visit: Payer: Medicare Other | Admitting: Internal Medicine

## 2017-07-16 ENCOUNTER — Encounter: Payer: Medicare Other | Admitting: Occupational Therapy

## 2017-07-16 DIAGNOSIS — Z01818 Encounter for other preprocedural examination: Secondary | ICD-10-CM | POA: Insufficient documentation

## 2017-07-16 DIAGNOSIS — D649 Anemia, unspecified: Secondary | ICD-10-CM | POA: Insufficient documentation

## 2017-07-16 MED ORDER — SODIUM CHLORIDE 0.9 % IV SOLN: Freq: Once | INTRAVENOUS | Status: DC

## 2017-07-17 ENCOUNTER — Encounter: Payer: Medicare Other | Admitting: Internal Medicine

## 2017-07-17 DIAGNOSIS — I87331 Chronic venous hypertension (idiopathic) with ulcer and inflammation of right lower extremity: Secondary | ICD-10-CM | POA: Diagnosis not present

## 2017-07-17 LAB — TYPE AND SCREEN
ABO/RH(D): A NEG
Antibody Screen: NEGATIVE
EXTEND SAMPLE REASON: TRANSFUSED
UNIT DIVISION: 0
Unit division: 0

## 2017-07-17 LAB — BPAM RBC
Blood Product Expiration Date: 201810232359
Blood Product Expiration Date: 201810272359
ISSUE DATE / TIME: 201810160751
ISSUE DATE / TIME: 201810161010
Unit Type and Rh: 600
Unit Type and Rh: 600

## 2017-07-18 ENCOUNTER — Encounter: Payer: Medicare Other | Admitting: Occupational Therapy

## 2017-07-18 ENCOUNTER — Inpatient Hospital Stay: Admission: RE | Admit: 2017-07-18 | Payer: Medicare Other | Source: Ambulatory Visit

## 2017-07-19 ENCOUNTER — Ambulatory Visit: Admission: RE | Admit: 2017-07-19 | Payer: Medicare Other | Source: Ambulatory Visit

## 2017-07-19 DIAGNOSIS — I87331 Chronic venous hypertension (idiopathic) with ulcer and inflammation of right lower extremity: Secondary | ICD-10-CM | POA: Diagnosis not present

## 2017-07-22 ENCOUNTER — Encounter: Payer: Medicare Other | Admitting: Occupational Therapy

## 2017-07-22 NOTE — Progress Notes (Signed)
DHRUV, CHRISTINA ([id number]) Visit Report for 07/17/2017 HPI Details Patient Name: Corey West, Corey West. Date of Service: 07/17/2017 3:15 PM Medical Record Number: [id number] Patient Account Number: [id number] Date of Birth/Sex: May 15, 1951 (66 y.o. Male) Treating RN: Ahmed Prima Primary Care Provider: [name] Other Clinician: Referring Provider: [name] Treating Provider/Extender: Tito Dine in Treatment: 12 History of Present Illness HPI Description: Pleasant 66 year old with a past medical history significant for morbid obesity, insulin- dependent type 2 diabetes (hemoglobin A1c 7.2 in January 2015), cirrhosis, and chronic kidney disease. He reports a left anterior calf ulcer since January 2015. He has had venous stasis ulcers in the past, which have healed spontaneously. No history of DVT. Does not regularly wear compression. Ambulating normally per his baseline. Recently completed a course of antibiotics. Treated with a Profore light and Coban 2 layer compression drainage, which both fell down and he cut off. Tolerated Coban lite compression bandage. Returns to clinic for followup. No complaints today. No pain at this time. No fever or chills. No open wounds at this time. He is having difficulty finding compression garments that fit him. 04/23/17; this is now 66 year old man who returns to our clinic for nonhealing wounds on the right lateral and right posterior calf. He tells me that they have been present for 6 months. In the setting of severe right greater than left lower extremity lymphedema likely secondary to chronic venous insufficiency. He has been followed in the lymphedema clinic at least since early June with compression wraps although these have not really helped and the patient is here for our review of this situation. The patient has had problems with edema in his legs for years. He has not worn compression. He is type II diabetic that sounds  poorly controlled. He also has obstructive sleep apnea and cirrhosis. He is occasional cigar smoker 04/30/17; patient is a 66 year old man who has severe bilateral lower extremity lymphedema stage III since his early 65s he tells me. This is no doubt secondary to chronic venous insufficiency. He was going to the lymphedema clinic who forwarded him here due to skin breakdown in the right leg. He has not previously worn compression stockings. He has a large area of denuded epithelium on the posterior leg. He has a more open wound on the right lateral leg 05/07/17 on evaluation today patient appears to be doing about the same his wounds are showing signs of improvement and looking fairly well. With that being said he is not having any severe discomfort just some mild aching due to the swelling and really this is worse on the left than the right and the right side is where he actually has his wounds. He has been tolerating the [employer] wraps without complication and states this is actually much better than the wraps that he was getting at the lymphedema clinic most recently. He is happy with these wraps. He has no nausea, vomiting, diarrhea and no fevers or chills. We are still waiting to hear back in regard to the lymphedema pumps which I do think would be beneficial for him even with the compression wraps he still has a significant amount of edema noted of the bilateral lower extremities. 05/15/17 on evaluation today patient's right lower extremity edema and weeping especially the posterior portion appears to be doing better. He still is having some issue here but fortunately it's minimal compared Corey West, Corey A. ([id number]) to previous weeks. He continues to tolerate the compression fairly well. Overall he is pleased  with how things are progressing. No fevers, chills, nausea, or vomiting noted at this time. And patient is having only minimal discomfort. 05/22/17; the medial wound on the right leg  actually looks considerably better and is epithelialized. He still has a small punched out area on the right lateral leg. Edema control is marginal end of the [employer]. We are working on getting him compression pumps 05/29/17; the patient's lymphedema pumps are apparently arriving tonight. In the meantime he took his Unna boots off last Sunday because the edema had progressed to the point that a word to titrate. Since then he has put nothing on his right leg and more specifically nothing on the right medial and lateral wounds 06/05/17; the patient has his lymphedema pumps and he is using them once a day on the right leg. There is a sizable reduction in the circumference of his leg although he arrived in clinic today somewhat short of breath. 07/02/17; since the patient was last in clinic he was admitted on 2 occasions. He was admitted from 9/12 through 9/13 with lactic acidosis weakness and felt to have left lower extremity cellulitis. Due to the elevated lactic acid level he was felt to have sepsis although this was ruled out and the lactic acidosis was felt secondary to his chronic cirrhosis. He was initially treated with IV vancomycin for cellulitis and redness of his left leg but then this was subsequently recognized to be secondary to chronic venous stasis and chronic lymphedema. When he left the hospital he had Unna boots placed bilaterally. He was readmitted on 9/26 through 9/28 with GI bleeding. His EGD showed Barrett's esophagus and gastritis. He was discharged with a hemoglobin of 8 to follow-up with GI as an outpatient. There was nothing more about his lower extremities during this short hospital stay. The patient has not been using his external compression pumps fearing fluid overload and shortness of breath although he feels better now that his hemoglobin is stabilized. He continues to have epithelial loss on his bilateral lower extremities. 07/09/17; patient has a large but superficial  area on the right posterior leg and on the left anterior leg. He tells me he is been using his external compression pumps once a day for the required hour. He states the pumps hurt his right ankle. 07/17/17; patient was transfused a further 2 units since he was here the last time. His hemoglobin was once again found to be low. He has not been able to use his external compression pumps because of a combination of prolonged power outage in his home and I think issues with his other medical care. His wounds have actually worsened he now has loss of epithelialization all the way from the right lateral to the right medial posteriorly. The left side which has anterior loss of epithelialization also appears to be worse on the lateral aspect of the leg where there is denuded epithelium now as well. Leaking edema fluid. He has bilateral lymphedema. Electronic Signature(s) Signed: 07/17/2017 5:51:56 PM By: Linton Ham MD Entered By: Linton Ham on 07/17/2017 17:45:17 Corey West ([id number]) -------------------------------------------------------------------------------- Physical Exam Details Patient Name: JENARO, SOUDER A. Date of Service: 07/17/2017 3:15 PM Medical Record Number: [id number] Patient Account Number: [id number] Date of Birth/Sex: Feb 24, 1951 (66 y.o. Male) Treating RN: Ahmed Prima Primary Care Provider: [name] Other Clinician: Referring Provider: [name] Treating Provider/Extender: Tito Dine in Treatment: 12 Constitutional Patient is hypertensive.. Pulse regular and within target range for patient.Marland Kitchen Respirations regular, non-labored and  within target range.. Temperature is normal and within the target range for the patient.Marland Kitchen appears in no distress. Eyes Conjunctivae clear. No discharge. Respiratory Respiratory effort is easy and symmetric bilaterally. Rate is normal at rest and on room air.. Shallow air entry  bilaterally. Cardiovascular Distant heart sounds probably pansystolic murmur compatible with tricuspid regurgitation I could not see his jugular venous pressure however. Gastrointestinal (GI) Standard but no fluid wave. Lymphatic None palpable in the popliteal or inguinal area. Psychiatric No evidence of depression, anxiety, or agitation. Calm, cooperative, and communicative. Appropriate interactions and affect.. Notes Wound exam; right calf posteriorly now going from lateral to medial aspects. A lot worse than last week. On the left there is denuded epithelium anteriorly. Also laterally in the left leg is new this week. There is no evidence of infection. The edema goes well up into his thighs towards his groin Electronic Signature(s) Signed: 07/17/2017 5:51:56 PM By: Linton Ham MD Entered By: Linton Ham on 07/17/2017 17:47:36 Corey West ([id number]) -------------------------------------------------------------------------------- Physician Orders Details Patient Name: Corey West, Corey A. Date of Service: 07/17/2017 3:15 PM Medical Record Number: [id number] Patient Account Number: [id number] Date of Birth/Sex: 1950-11-24 (66 y.o. Male) Treating RN: Ahmed Prima Primary Care Provider: [name] Other Clinician: Referring Provider: [name] Treating Provider/Extender: Tito Dine in Treatment: 12 Verbal / Phone Orders: Yes ClinicianCarolyne Fiscal, Debi Read Back and Verified: Yes Diagnosis Coding Wound Cleansing Wound #3 Right,Circumferential Lower Leg o Clean wound with Normal Saline. o Cleanse wound with mild soap and water Wound #5 Left,Anterior Lower Leg o Clean wound with Normal Saline. o Cleanse wound with mild soap and water Anesthetic Wound #3 Right,Circumferential Lower Leg o Topical Lidocaine 4% cream applied to wound bed prior to debridement Wound #5 Left,Anterior Lower Leg o Topical Lidocaine 4% cream applied to wound bed  prior to debridement Primary Wound Dressing Wound #3 Right,Circumferential Lower Leg o Aquacel Ag Wound #5 Left,Anterior Lower Leg o Aquacel Ag Secondary Dressing Wound #3 Right,Circumferential Lower Leg o ABD pad o XtraSorb Wound #5 Left,Anterior Lower Leg o ABD pad o XtraSorb Dressing Change Frequency Wound #3 Right,Circumferential Lower Leg o Change dressing every week Corey West, Corey West ([id number]) Wound #5 Left,Anterior Lower Leg o Change dressing every week Follow-up Appointments Wound #3 Right,Circumferential Lower Leg o Return Appointment in 1 week. o Nurse Visit as needed Wound #5 Left,Anterior Lower Leg o Return Appointment in 1 week. o Nurse Visit as needed Edema Control Wound #3 Right,Circumferential Lower Leg o 3 Layer Compression System - Right Lower Extremity - unna to anchor o Compression Pump: Use compression pump on left lower extremity for 30 minutes, twice daily. Wound #5 Left,Anterior Lower Leg o 3 Layer Compression System - Right Lower Extremity - unna to anchor o Compression Pump: Use compression pump on left lower extremity for 30 minutes, twice daily. Additional Orders / Instructions Wound #3 Right,Circumferential Lower Leg o Increase protein intake. o Other: - Get Blood Sugars below 150 Wound #5 Left,Anterior Lower Leg o Increase protein intake. o Other: - Get Blood Sugars below 150 Electronic Signature(s) Signed: 07/17/2017 5:51:56 PM By: Linton Ham MD Signed: 07/18/2017 4:19:36 PM By: Alric Quan Entered By: Alric Quan on 07/17/2017 16:43:07 Corey West ([id number]) -------------------------------------------------------------------------------- Problem List Details Patient Name: Corey West, Corey A. Date of Service: 07/17/2017 3:15 PM Medical Record Number: [id number] Patient Account Number: [id number] Date of Birth/Sex: [birth year] (67 y.o. Male) Treating RN: Ahmed Prima Primary Care  Provider: [name] Other Clinician: Referring Provider: [name] Treating  Provider/Extender: Tito Dine in Treatment: 12 Active Problems ICD-10 Encounter Code Description Active Date Diagnosis I89.0 Lymphedema, not elsewhere classified 04/23/2017 Yes L97.211 Non-pressure chronic ulcer of right calf limited to 04/23/2017 Yes breakdown of skin I87.331 Chronic venous hypertension (idiopathic) with ulcer and 04/23/2017 Yes inflammation of right lower extremity E11.622 Type 2 diabetes mellitus with other skin ulcer 04/23/2017 Yes I89.9 Noninfective disorder of lymphatic vessels and lymph 04/23/2017 Yes nodes, unspecified Inactive Problems Resolved Problems Electronic Signature(s) Signed: 07/17/2017 5:51:56 PM By: Linton Ham MD Entered By: Linton Ham on 07/17/2017 17:43:32 Corey West ([id number]) -------------------------------------------------------------------------------- Progress Note Details Patient Name: Corey Cower A. Date of Service: 07/17/2017 3:15 PM Medical Record Number: [id number] Patient Account Number: [id number] Date of Birth/Sex: July 29, 1951 (66 y.o. Male) Treating RN: Ahmed Prima Primary Care Provider: [name] Other Clinician: Referring Provider: [name] Treating Provider/Extender: Tito Dine in Treatment: 12 Subjective History of Present Illness (HPI) Pleasant 66 year old with a past medical history significant for morbid obesity, insulin-dependent type 2 diabetes (hemoglobin A1c 7.2 in January 2015), cirrhosis, and chronic kidney disease. He reports a left anterior calf ulcer since January 2015. He has had venous stasis ulcers in the past, which have healed spontaneously. No history of DVT. Does not regularly wear compression. Ambulating normally per his baseline. Recently completed a course of antibiotics. Treated with a Profore light and Coban 2 layer compression drainage, which both fell down and  he cut off. Tolerated Coban lite compression bandage. Returns to clinic for followup. No complaints today. No pain at this time. No fever or chills. No open wounds at this time. He is having difficulty finding compression garments that fit him. 04/23/17; this is now 66 year old man who returns to our clinic for nonhealing wounds on the right lateral and right posterior calf. He tells me that they have been present for 6 months. In the setting of severe right greater than left lower extremity lymphedema likely secondary to chronic venous insufficiency. He has been followed in the lymphedema clinic at least since early June with compression wraps although these have not really helped and the patient is here for our review of this situation. The patient has had problems with edema in his legs for years. He has not worn compression. He is type II diabetic that sounds poorly controlled. He also has obstructive sleep apnea and cirrhosis. He is occasional cigar smoker 04/30/17; patient is a 66 year old man who has severe bilateral lower extremity lymphedema stage III since his early 5s he tells me. This is no doubt secondary to chronic venous insufficiency. He was going to the lymphedema clinic who forwarded him here due to skin breakdown in the right leg. He has not previously worn compression stockings. He has a large area of denuded epithelium on the posterior leg. He has a more open wound on the right lateral leg 05/07/17 on evaluation today patient appears to be doing about the same his wounds are showing signs of improvement and looking fairly well. With that being said he is not having any severe discomfort just some mild aching due to the swelling and really this is worse on the left than the right and the right side is where he actually has his wounds. He has been tolerating the [employer] wraps without complication and states this is actually much better than the wraps that he was getting at the  lymphedema clinic most recently. He is happy with these wraps. He has no nausea, vomiting, diarrhea and no  fevers or chills. We are still waiting to hear back in regard to the lymphedema pumps which I do think would be beneficial for him even with the compression wraps he still has a significant amount of edema noted of the bilateral lower extremities. 05/15/17 on evaluation today patient's right lower extremity edema and weeping especially the posterior portion appears to be doing better. He still is having some issue here but fortunately it's minimal compared to previous weeks. He continues to tolerate the compression fairly well. Overall he is pleased with how things are progressing. No fevers, chills, nausea, or vomiting noted at this time. And patient is having only Corey West, Corey A. ([id number]) minimal discomfort. 05/22/17; the medial wound on the right leg actually looks considerably better and is epithelialized. He still has a small punched out area on the right lateral leg. Edema control is marginal end of the [employer]. We are working on getting him compression pumps 05/29/17; the patient's lymphedema pumps are apparently arriving tonight. In the meantime he took his Unna boots off last Sunday because the edema had progressed to the point that a word to titrate. Since then he has put nothing on his right leg and more specifically nothing on the right medial and lateral wounds 06/05/17; the patient has his lymphedema pumps and he is using them once a day on the right leg. There is a sizable reduction in the circumference of his leg although he arrived in clinic today somewhat short of breath. 07/02/17; since the patient was last in clinic he was admitted on 2 occasions. He was admitted from 9/12 through 9/13 with lactic acidosis weakness and felt to have left lower extremity cellulitis. Due to the elevated lactic acid level he was felt to have sepsis although this was ruled out and the lactic  acidosis was felt secondary to his chronic cirrhosis. He was initially treated with IV vancomycin for cellulitis and redness of his left leg but then this was subsequently recognized to be secondary to chronic venous stasis and chronic lymphedema. When he left the hospital he had Unna boots placed bilaterally. He was readmitted on 9/26 through 9/28 with GI bleeding. His EGD showed Barrett's esophagus and gastritis. He was discharged with a hemoglobin of 8 to follow-up with GI as an outpatient. There was nothing more about his lower extremities during this short hospital stay. The patient has not been using his external compression pumps fearing fluid overload and shortness of breath although he feels better now that his hemoglobin is stabilized. He continues to have epithelial loss on his bilateral lower extremities. 07/09/17; patient has a large but superficial area on the right posterior leg and on the left anterior leg. He tells me he is been using his external compression pumps once a day for the required hour. He states the pumps hurt his right ankle. 07/17/17; patient was transfused a further 2 units since he was here the last time. His hemoglobin was once again found to be low. He has not been able to use his external compression pumps because of a combination of prolonged power outage in his home and I think issues with his other medical care. His wounds have actually worsened he now has loss of epithelialization all the way from the right lateral to the right medial posteriorly. The left side which has anterior loss of epithelialization also appears to be worse on the lateral aspect of the leg where there is denuded epithelium now as well. Leaking edema fluid. He  has bilateral lymphedema. Objective Constitutional Patient is hypertensive.. Pulse regular and within target range for patient.Marland Kitchen Respirations regular, non-labored and within target range.. Temperature is normal and within the  target range for the patient.Marland Kitchen appears in no distress. Corey West, Corey West ([id number]) Vitals Time Taken: 3:43 PM, Height: 69 in, Weight: 305.4 lbs, BMI: 45.1, Temperature: 98.2 F, Pulse: 76 bpm, Respiratory Rate: 18 breaths/min, Blood Pressure: 156/55 mmHg. Eyes Conjunctivae clear. No discharge. Respiratory Respiratory effort is easy and symmetric bilaterally. Rate is normal at rest and on room air.. Shallow air entry bilaterally. Cardiovascular Distant heart sounds probably pansystolic murmur compatible with tricuspid regurgitation I could not see his jugular venous pressure however. Gastrointestinal (GI) Standard but no fluid wave. Lymphatic None palpable in the popliteal or inguinal area. Psychiatric No evidence of depression, anxiety, or agitation. Calm, cooperative, and communicative. Appropriate interactions and affect.. General Notes: Wound exam; right calf posteriorly now going from lateral to medial aspects. A lot worse than last week. On the left there is denuded epithelium anteriorly. Also laterally in the left leg is new this week. There is no evidence of infection. The edema goes well up into his thighs towards his groin Integumentary (Hair, Skin) Wound #3 status is Open. Original cause of wound was Gradually Appeared. The wound is located on the Right,Circumferential Lower Leg. The wound measures 10.5cm length x 49cm width x 0.2cm depth; 404.087cm^2 area and 80.817cm^3 volume. There is no tunneling or undermining noted. There is a large amount of serous drainage noted. Foul odor after cleansing was noted. The wound margin is flat and intact. There is medium (34-66%) pink granulation within the wound bed. There is a medium (34-66%) amount of necrotic tissue within the wound bed including Adherent Slough. The periwound skin appearance exhibited: Maceration. The periwound skin appearance did not exhibit: Callus, Crepitus, Excoriation, Induration, Rash, Scarring, Dry/Scaly,  Atrophie Blanche, Cyanosis, Ecchymosis, Hemosiderin Staining, Mottled, Pallor, Rubor, Erythema. Periwound temperature was noted as No Abnormality. The periwound has tenderness on palpation. Wound #4 status is Converted. Original cause of wound was Gradually Appeared. The wound is located on the Right,Lateral Lower Leg. The wound measures 0cm length x 0cm width x 0cm depth; 0cm^2 area and 0cm^3 volume. Wound #5 status is Open. Original cause of wound was Gradually Appeared. The wound is located on the Left,Anterior Lower Leg. The wound measures 9.5cm length x 5cm width x 0.2cm depth; 37.306cm^2 area and 7.461cm^3 volume. There is no tunneling or undermining noted. There is a large amount of serous drainage noted. The wound margin is distinct with the outline attached to the wound base. There is large (67-100%) pink granulation within the wound bed. There is a small (1-33%) amount of necrotic tissue within Corey West, Corey A. ([id number]) the wound bed including Adherent Slough. The periwound skin appearance exhibited: Maceration. Periwound temperature was noted as No Abnormality. The periwound has tenderness on palpation. 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 Cleanse wound with mild soap and water Wound #5 Left,Anterior Lower Leg: Clean wound with Normal Saline. Cleanse wound with mild soap and water Anesthetic: Wound #3 Right,Circumferential Lower Leg: Topical Lidocaine 4% cream applied  to wound bed prior to debridement Wound #5  Left,Anterior Lower Leg: Topical Lidocaine 4% cream applied to wound bed prior to debridement Primary Wound Dressing: Wound #3 Right,Circumferential Lower Leg: Aquacel Ag Wound #5 Left,Anterior Lower Leg: Aquacel Ag Secondary Dressing: Wound #3 Right,Circumferential Lower Leg: ABD pad XtraSorb Wound #5 Left,Anterior Lower Leg: ABD pad XtraSorb Dressing Change Frequency: Wound #3 Right,Circumferential Lower Leg: Corey West, Corey West ([id number]) Change dressing every week Wound #5 Left,Anterior Lower Leg: Change dressing every week Follow-up Appointments: Wound #3 Right,Circumferential Lower Leg: Return Appointment in 1 week. Nurse Visit as needed Wound #5 Left,Anterior Lower Leg: Return Appointment in 1 week. Nurse Visit as needed Edema Control: Wound #3 Right,Circumferential Lower Leg: 3 Layer Compression System - Right Lower Extremity - unna to anchor Compression Pump: Use compression pump on left lower extremity for 30 minutes, twice daily. Wound #5 Left,Anterior Lower Leg: 3 Layer Compression System - Right Lower Extremity - unna to anchor Compression Pump: Use compression pump on left lower extremity for 30 minutes, twice daily. Additional Orders / Instructions: Wound #3 Right,Circumferential Lower Leg: Increase protein intake. Other: - Get Blood Sugars below 150 Wound #5 Left,Anterior Lower Leg: Increase protein intake. Other: - Get Blood Sugars below 150 o o #1 there is little choice about what to do with this patient we continue to put silver alginate/ABDs/4-layer compression. He will need to use his external compression pumps on top of this #2 he asked me about increasing his diuretics. He is already on Lasix 40 twice a day, spironolactone and he has when necessary metolazone however I was concerned that he is not getting enough clinical follow- up to give him a card wash okay to restart metolazone. Furthermore looking back in his records his potassium was 3.4 when  he left the hospital late last month. At being said he clearly needs to increase his diuretics #3 he is fluid overloaded is been transfused. I think he needs more diuretics in order to avoid further skin breakdown on both legs which was already worse today. I did ask him to go back to see his primary doctor who is apparently new Corey West, Corey West ([id number]) Electronic Signature(s) Signed: 07/17/2017 5:51:56 PM By: Linton Ham MD Entered By: Linton Ham on 07/17/2017 17:49:55 Corey West ([id number]) -------------------------------------------------------------------------------- [place] Details Patient Name: Corey West Date of Service: 07/17/2017 Medical Record Number: [id number] Patient Account Number: [id number] Date of Birth/Sex: June 20, 1951 (66 y.o. 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 Physician Procedures CPT4: Description Modifier Quantity Code 9381017 51025 - WC PHYS LEVEL 4 - EST PT 1 ICD-10 Description Diagnosis L97.211 Non-pressure chronic ulcer of right calf limited to breakdown of skin L97.821 Non-pressure chronic ulcer of other  part of left lower  leg limited to breakdown of skin I89.0 Lymphedema, not elsewhere classified Electronic Signature(s) Signed: 07/17/2017 5:51:56 PM By:  Linton Ham MD Previous Signature: 07/17/2017 5:20:28 PM Version By: Alric Quan Previous Signature: 07/17/2017 5:19:11 PM Version By: Alric Quan Previous Signature: 07/17/2017 5:15:32 PM Version By: Alric Quan Entered By: Linton Ham on 07/17/2017 17:50:33

## 2017-07-22 NOTE — Progress Notes (Signed)
Corey West, Corey West ([id number]) Visit Report for 07/17/2017 Arrival Information Details Patient Name: Corey West, TRUDO. Date of Service: 07/17/2017 3:15 PM Medical Record Number: [id number] Patient Account Number: [id number] Date of Birth/Sex: [birth year] (66 y.o. Male) Treating RN: Ahmed Prima Primary Care Sylar Voong: [name] Other Clinician: Referring Angila Wombles: [name] Treating Annaya Bangert/Extender: Tito Dine in Treatment: 12 Visit Information History Since Last Visit All ordered tests and consults were completed: No Patient Arrived: Kasandra Knudsen Added or deleted any medications: No Arrival Time: 15:41 Any new allergies or adverse reactions: No Accompanied By: self Had a fall or experienced change in No Transfer Assistance: EasyP[employer] activities of daily living that may affect Patient Lift risk of falls: Patient Identification Verified: Yes Signs or symptoms of abuse/neglect since last No Secondary Verification Process Yes visito Completed: Hospitalized since last visit: No Patient Requires Transmission- No Has Dressing in Place as Prescribed: Yes Based Precautions: Has Compression in Place as Prescribed: Yes Patient Has Alerts: No Pain Present Now: No Electronic Signature(s) Signed: 07/18/2017 4:19:36 PM By: Alric Quan Entered By: Alric Quan on 07/17/2017 15:42:49 Corey West ([id number]) -------------------------------------------------------------------------------- Encounter Discharge Information Details Patient Name: Corey Cower A. Date of Service: 07/17/2017 3:15 PM Medical Record Number: [id number] Patient Account Number: [id number] Date of Birth/Sex: [birth year] (66 y.o. Male) Treating RN: Ahmed Prima Primary Care Lorey Pallett: [name] Other Clinician: Referring Sabreen Kitchen: [name] Treating Ashrita Chrismer/Extender: Tito Dine in Treatment: 12 Encounter Discharge Information Items Discharge Pain Level: 0 Discharge  Condition: Stable Ambulatory Status: Cane Discharge Destination: Home Transportation: Private Auto Accompanied By: self Schedule Follow-up Appointment: Yes Medication Reconciliation completed and provided to Patient/Care No Taffany Heiser: Provided on Clinical Summary of Care: 07/17/2017 Form Type Recipient Paper Patient [name] Signature(s) Signed: 07/17/2017 5:14:48 PM By: Alric Quan Entered By: Alric Quan on 07/17/2017 17:14:48 Corey West ([id number]) -------------------------------------------------------------------------------- Lower Extremity Assessment Details Patient Name: Corey Cower A. Date of Service: 07/17/2017 3:15 PM Medical Record Number: [id number] Patient Account Number: [id number] Date of Birth/Sex: 1951/08/10 (66 y.o. Male) Treating RN: Carolyne Fiscal, Debi Primary Care Vonya Ohalloran: [name] Other Clinician: Referring Mearl Harewood: [name] Treating Dazani Norby/Extender: Tito Dine in Treatment: 12 Edema Assessment Assessed: [Left: No] [Right: No] E[Left: dema] [Right: :] Calf Left: Right: Point of Measurement: 32 cm From Medial Instep 47.2 cm 49.5 cm Ankle Left: Right: Point of Measurement: 13 cm From Medial Instep 30.2 cm 30.2 cm Vascular Assessment Pulses: Dorsalis Pedis Palpable: [Left:Yes] [Right:Yes] Posterior Tibial Extremity colors, hair growth, and conditions: Extremity Color: [Left:Red] [Right:Red] Temperature of Extremity: [Left:Warm] [Right:Warm] Capillary Refill: [Left:< 3 seconds] [Right:< 3 seconds] Toe Nail Assessment Left: Right: Thick: No No Discolored: No No Deformed: No No Improper Length and Hygiene: No No Electronic Signature(s) Signed: 07/18/2017 4:19:36 PM By: Alric Quan Entered By: Alric Quan on 07/17/2017 16:09:07 Corey West ([id number]) -------------------------------------------------------------------------------- Multi Wound Chart Details Patient Name: Corey Cower  A. Date of Service: 07/17/2017 3:15 PM Medical Record Number: [id number] Patient Account Number: [id number] Date of Birth/Sex: 1951-06-30 (66 y.o. Male) Treating RN: Ahmed Prima Primary Care Nicholes Hibler: [name] Other Clinician: Referring Skippy Marhefka: [name] Treating Chantrell Apsey/Extender: Tito Dine in Treatment: 12 Vital Signs Height(in): 69 Pulse(bpm): 76 Weight(lbs): 305.4 Blood Pressure 156/55 (mmHg): Body Mass Index(BMI): 45 Temperature(F): 98.2 Respiratory Rate 18 (breaths/min): Photos: [3:No Photos] [4:No Photos] [5:No Photos] Wound Location: [3:Right Lower Leg - Posterior] [4:Right, Lateral Lower Leg Left Lower Leg - Anterior] Wounding Event: [3:Gradually Appeared] [4:Gradually Appeared] [5:Gradually Appeared] Primary Etiology: [3:Lymphedema] [4:Lymphedema] [5:Lymphedema] Secondary  Etiology: [3:Diabetic Wound/Ulcer of the Lower Extremity] [4:Diabetic Wound/Ulcer of the Lower Extremity] [5:N/A] Comorbid History: [3:Cataracts, Anemia, Sleep Apnea, Hypertension, Cirrhosis , Type II Diabetes] [4:N/A] [5:Cataracts, Anemia, Sleep Apnea, Hypertension, Cirrhosis , Type II Diabetes] Date Acquired: [3:01/14/2017] [4:01/07/2017] [5:07/02/2017] Weeks of Treatment: [3:12] [4:12] [5:2] Wound Status: [3:Open] [4:Converted] [5:Open] Clustered Wound: [3:No] [4:No] [5:Yes] Clustered Quantity: [3:N/A] [4:N/A] [5:2] Measurements L x W x D 10.5x49x0.2 [4:0x0x0] [5:9.5x5x0.2] (cm) Area (cm) : [3:404.087] [4:0] [5:37.306] Volume (cm) : [3:80.817] [4:0] [5:7.461] % Reduction in Area: [3:-950.00%] [4:100.00%] [5:-452.40%] % Reduction in Volume: -110.00% [4:100.00%] [5:-1005.30%] Classification: [3:Partial Thickness] [4:Full Thickness Without Exposed Support Structures] [5:Partial Thickness] Exudate Amount: [3:Large] [4:N/A] [5:Large] Exudate Type: [3:Serous] [4:N/A] [5:Serous] Exudate Color: [3:amber] [4:N/A] [5:amber] Foul Odor After [3:Yes] [4:N/A]  [5:No] Cleansing: CARON, TARDIF ([id number]) Odor Anticipated Due to No N/A N/A Product Use: Wound Margin: Flat and Intact N/A Distinct, outline attached Granulation Amount: Medium (34-66%) N/A Large (67-100%) Granulation Quality: Pink N/A Pink Necrotic Amount: Medium (34-66%) N/A Small (1-33%) Exposed Structures: Fascia: No N/A N/A Fat Layer (Subcutaneous Tissue) Exposed: No Tendon: No Muscle: No Joint: No Bone: No Epithelialization: None N/A None Periwound Skin Texture: Excoriation: No No Abnormalities Noted No Abnormalities Noted Induration: No Callus: No Crepitus: No Rash: No Scarring: No Periwound Skin Maceration: Yes No Abnormalities Noted Maceration: Yes Moisture: Dry/Scaly: No Periwound Skin Color: Atrophie Blanche: No No Abnormalities Noted No Abnormalities Noted Cyanosis: No Ecchymosis: No Erythema: No Hemosiderin Staining: No Mottled: No Pallor: No Rubor: No Temperature: No Abnormality N/A No Abnormality Tenderness on Yes No Yes Palpation: Wound Preparation: Ulcer Cleansing: N/A Ulcer Cleansing: Rinsed/Irrigated with Rinsed/Irrigated with Saline Saline Topical Anesthetic Topical Anesthetic Applied: None Applied: Other: lidocaine 4% Treatment Notes Wound #3 (Right, Circumferential Lower Leg) 1. Cleansed with: Clean wound with Normal Saline Cleanse wound with antibacterial soap and water 4. Dressing Applied: Aquacel Ag 5. Secondary Dressing Applied Corey West, Corey West ([id number]) ABD Pad 7. Secured with Tape 3 Layer Compression System - Bilateral Notes xtrasorb, unna to anchor Wound #5 (Left, Anterior Lower Leg) 1. Cleansed with: Clean wound with Normal Saline Cleanse wound with antibacterial soap and water 4. Dressing Applied: Aquacel Ag 5. Secondary Dressing Applied ABD Pad 7. Secured with Tape 3 Layer Compression System - Bilateral Notes xtrasorb, unna to Engineer, production) Signed: 07/17/2017 5:51:56 PM By: Linton Ham  MD Entered By: Linton Ham on 07/17/2017 17:43:46 Corey West ([id number]) -------------------------------------------------------------------------------- [place] Details Patient Name: Corey West, Corey A. Date of Service: 07/17/2017 3:15 PM Medical Record Number: [id number] Patient Account Number: [id number] Date of Birth/Sex: Aug 07, 1951 (66 y.o. Male) Treating RN: Ahmed Prima Primary Care Seda Kronberg: [name] Other Clinician: Referring Waleska Buttery: [name] Treating Janiqua Friscia/Extender: Tito Dine in Treatment: 12 Active Inactive ` Orientation to the Wound Care Program Nursing Diagnoses: Knowledge deficit related to the wound healing center program Goals: Patient/caregiver will verbalize understanding of the [place] Program Date Initiated: 04/23/2017 Target Resolution Date: 05/04/2017 Goal Status: Active Interventions: Provide education on orientation to the wound center Notes: ` Venous Leg Ulcer Nursing Diagnoses: Potential for venous Insuffiency (use before diagnosis confirmed) Goals: Patient will maintain optimal edema control Date Initiated: 04/23/2017 Target Resolution Date: 05/04/2017 Goal Status: Active Interventions: Compression as ordered Treatment Activities: Therapeutic compression applied : 04/23/2017 Notes: ` Wound/Skin Impairment Corey West, Corey West ([id number]) Nursing Diagnoses: Impaired tissue integrity Goals: Ulcer/skin breakdown will have a volume reduction of 80% by week 12 Date Initiated: 04/23/2017 Target Resolution Date: 05/04/2017 Goal Status: Active Interventions:  Assess ulceration(s) every visit Notes: Electronic Signature(s) Signed: 07/18/2017 4:19:36 PM By: Alric Quan Entered By: Alric Quan on 07/17/2017 16:22:09 Corey West ([id number]) -------------------------------------------------------------------------------- Pain Assessment Details Patient Name: Corey West Date  of Service: 07/17/2017 3:15 PM Medical Record Number: [id number] Patient Account Number: [id number] Date of Birth/Sex: [birth year] (66 y.o. Male) Treating RN: Ahmed Prima Primary Care Aika Brzoska: [name] Other Clinician: Referring Damarrion Mimbs: [name] Treating Kemper Heupel/Extender: Tito Dine in Treatment: 12 Active Problems Location of Pain Severity and Description of Pain Patient Has Paino No Site Locations Pain Management and Medication Current Pain Management: Electronic Signature(s) Signed: 07/18/2017 4:19:36 PM By: Alric Quan Entered By: Alric Quan on 07/17/2017 15:42:55 Corey West ([id number]) -------------------------------------------------------------------------------- Patient/Caregiver Education Details Patient Name: Corey Cower A. Date of Service: 07/17/2017 3:15 PM Medical Record Number: [id number] Patient Account Number: [id number] Date of Birth/Gender: [birth year] (66 y.o. Male) Treating RN: Ahmed Prima Primary Care Physician: [name] Other Clinician: Referring Physician: [name] Treating Physician/Extender: Tito Dine in Treatment: 12 Education Assessment Education Provided To: Patient Education Topics Provided Wound/Skin Impairment: Handouts: Other: keep wraps dry Methods: Demonstration, Explain/Verbal Responses: State content correctly Electronic Signature(s) Signed: 07/18/2017 4:19:36 PM By: Alric Quan Entered By: Alric Quan on 07/17/2017 17:15:12 Corey West ([id number]) -------------------------------------------------------------------------------- Wound Assessment Details Patient Name: Corey Cower A. Date of Service: 07/17/2017 3:15 PM Medical Record Number: [id number] Patient Account Number: [id number] Date of Birth/Sex: 1950-10-26 (66 y.o. Male) Treating RN: Carolyne Fiscal, Debi Primary Care Darryl Blumenstein: [name] Other Clinician: Referring Giulianna Rocha: [name] Treating Shanita Kanan/Extender: Tito Dine in Treatment: 12 Wound Status Wound Number: 3 Primary Lymphedema Etiology: Wound Location: Right Lower Leg - Posterior Secondary Diabetic Wound/Ulcer of the Lower Wounding Event: Gradually Appeared Etiology: Extremity Date Acquired: 01/14/2017 Wound Open Weeks Of Treatment: 12 Status: Clustered Wound: No Comorbid Cataracts, Anemia, Sleep Apnea, History: Hypertension, Cirrhosis , Type II Diabetes Photos Photo Uploaded By: Alric Quan on 07/18/2017 14:10:27 Wound Measurements Length: (cm) 10.5 Width: (cm) 49 Depth: (cm) 0.2 Area: (cm) 404.087 Volume: (cm) 80.817 % Reduction in Area: -950% % Reduction in Volume: -110% Epithelialization: None Tunneling: No Undermining: No Wound Description Classification: Partial Thickness Wound Margin: Flat and Intact Exudate Amount: Large Exudate Type: Serous Exudate Color: amber Foul Odor After Cleansing: Yes Due to Product Use: No Slough/Fibrino Yes Wound Bed Granulation Amount: Medium (34-66%) Exposed Structure Granulation Quality: Pink Fascia Exposed: No Corey West, Corey West ([id number]) Necrotic Amount: Medium (34-66%) Fat Layer (Subcutaneous Tissue) Exposed: No Necrotic Quality: Adherent Slough Tendon Exposed: No Muscle Exposed: No Joint Exposed: No Bone Exposed: No Periwound Skin Texture Texture Color No Abnormalities Noted: No No Abnormalities Noted: No Callus: No Atrophie Blanche: No Crepitus: No Cyanosis: No Excoriation: No Ecchymosis: No Induration: No Erythema: No Rash: No Hemosiderin Staining: No Scarring: No Mottled: No Pallor: No Moisture Rubor: No No Abnormalities Noted: No Dry / Scaly: No Temperature / Pain Maceration: Yes Temperature: No Abnormality Tenderness on Palpation: Yes Wound Preparation Ulcer Cleansing: Rinsed/Irrigated with Saline Topical Anesthetic Applied: None Electronic Signature(s) Signed: 07/18/2017 4:19:36 PM By:  Alric Quan Entered By: Alric Quan on 07/17/2017 16:06:45 Corey West ([id number]) -------------------------------------------------------------------------------- Wound Assessment Details Patient Name: Corey Cower A. Date of Service: 07/17/2017 3:15 PM Medical Record Number: [id number] Patient Account Number: [id number] Date of Birth/Sex: 01-14-1951 (65 y.o. Male) Treating RN: Ahmed Prima Primary Care Heidi Maclin: [name] Other Clinician: Referring Gumaro Brightbill: [name] Treating Tryniti Laatsch/Extender: Ricard Dillon Weeks in Treatment: 12 Wound Status Wound Number: 4 Primary  Etiology: Lymphedema Wound Location: Right, Lateral Lower Leg Secondary Diabetic Wound/Ulcer of the Lower Etiology: Extremity Wounding Event: Gradually Appeared Wound Status: Converted Date Acquired: 01/07/2017 Weeks Of Treatment: 12 Clustered Wound: No Photos Photo Uploaded By: Alric Quan on 07/18/2017 14:11:44 Wound Measurements Length: (cm) 0 % Reduct Width: (cm) 0 % Reduct Depth: (cm) 0 Area: (cm) 0 Volume: (cm) 0 ion in Area: 100% ion in Volume: 100% Wound Description Full Thickness Without Exposed Classification: Support Structures Periwound Skin Texture Texture Color No Abnormalities Noted: No No Abnormalities Noted: No Moisture No Abnormalities Noted: No Electronic Signature(s) Signed: 07/18/2017 4:19:36 PM By: Annamary Carolin, Linus Mako ([id number]) Entered By: Alric Quan on 07/17/2017 16:05:18 Corey West ([id number]) -------------------------------------------------------------------------------- Wound Assessment Details Patient Name: Corey Cower A. Date of Service: 07/17/2017 3:15 PM Medical Record Number: [id number] Patient Account Number: [id number] Date of Birth/Sex: 1951-06-22 (66 y.o. Male) Treating RN: Carolyne Fiscal, Debi Primary Care Miciah Shealy: [name] Other Clinician: Referring Jasher Barkan: [name] Treating  Arielys Wandersee/Extender: Tito Dine in Treatment: 12 Wound Status Wound Number: 5 Primary Lymphedema Etiology: Wound Location: Left Lower Leg - Anterior Wound Open Wounding Event: Gradually Appeared Status: Date Acquired: 07/02/2017 Comorbid Cataracts, Anemia, Sleep Apnea, Weeks Of Treatment: 2 History: Hypertension, Cirrhosis , Type II Clustered Wound: Yes Diabetes Photos Photo Uploaded By: Alric Quan on 07/18/2017 14:11:44 Wound Measurements Length: (cm) 9.5 Width: (cm) 5 Depth: (cm) 0.2 Clustered Quantity: 2 Area: (cm) 37.306 Volume: (cm) 7.461 % Reduction in Area: -452.4% % Reduction in Volume: -1005.3% Epithelialization: None Tunneling: No Undermining: No Wound Description Classification: Partial Thickness Wound Margin: Distinct, outline attached Exudate Amount: Large Exudate Type: Serous Exudate Color: amber Foul Odor After Cleansing: No Slough/Fibrino Yes Wound Bed Granulation Amount: Large (67-100%) Granulation Quality: Pink Necrotic Amount: Small (1-33%) Corey West, Corey West ([id number]) Necrotic Quality: Adherent Slough Periwound Skin Texture Texture Color No Abnormalities Noted: No No Abnormalities Noted: No Moisture Temperature / Pain No Abnormalities Noted: No Temperature: No Abnormality Maceration: Yes Tenderness on Palpation: Yes Wound Preparation Ulcer Cleansing: Rinsed/Irrigated with Saline Topical Anesthetic Applied: Other: lidocaine 4%, Electronic Signature(s) Signed: 07/18/2017 4:19:36 PM By: Alric Quan Entered By: Alric Quan on 07/17/2017 16:04:14 Corey West ([id number]) -------------------------------------------------------------------------------- [place] Details Patient Name: Corey West Date of Service: 07/17/2017 3:15 PM Medical Record Number: [id number] Patient Account Number: [id number] Date of Birth/Sex: 19-Feb-1951 (66 y.o. Male) Treating RN: Ahmed Prima Primary Care Berkeley Veldman: [name] Other Clinician: Referring Michalle Rademaker: [name] Treating Alvaretta Eisenberger/Extender: Tito Dine in Treatment: 12 Vital Signs Time Taken: 15:43 Temperature (F): 98.2 Height (in): 69 Pulse (bpm): 76 Weight (lbs): 305.4 Respiratory Rate (breaths/min): 18 Body Mass Index (BMI): 45.1 Blood Pressure (mmHg): 156/55 Reference Range: 80 - 120 mg / dl Electronic Signature(s) Signed: 07/18/2017 4:19:36 PM By: Alric Quan Entered By: Alric Quan on 07/17/2017 15:44:47

## 2017-07-22 NOTE — Progress Notes (Signed)
TRAYVEON, BECKFORD ([id number]) Visit Report for 07/19/2017 Arrival Information Details Patient Name: Corey West, Corey West. Date of Service: 07/19/2017 8:45 AM Medical Record Number: [id number] Patient Account Number: [id number] Date of Birth/Sex: 08-28-1951 (66 y.o. Male) Treating RN: Ahmed Prima Primary Care Calianna Kim: [name] Other Clinician: Referring Blaine Hari: [name] Treating Mamie Hundertmark/Extender: Frann Rider in Treatment: 12 Visit Information History Since Last Visit All ordered tests and consults were completed: No Patient Arrived: Kasandra Knudsen Added or deleted any medications: No Arrival Time: 08:47 Any new allergies or adverse reactions: No Accompanied By: self Had a fall or experienced change in No Transfer Assistance: EasyPivot Patient activities of daily living that may affect Lift risk of falls: Patient Identification Verified: Yes Signs or symptoms of abuse/neglect since last visito No Secondary Verification Process Yes Hospitalized since last visit: No Completed: Has Dressing in Place as Prescribed: Yes Patient Requires Transmission-Based No Precautions: Has Compression in Place as Prescribed: Yes Patient Has Alerts: No Pain Present Now: No Electronic Signature(s) Signed: 07/19/2017 4:20:42 PM By: Alric Quan Entered By: Alric Quan on 07/19/2017 08:47:47 Corey West ([id number]) -------------------------------------------------------------------------------- Encounter Discharge Information Details Patient Name: Corey Cower A. Date of Service: 07/19/2017 8:45 AM Medical Record Number: [id number] Patient Account Number: [id number] Date of Birth/Sex: [birth year] (66 y.o. Male) Treating RN: Ahmed Prima Primary Care Yakov Bergen: [name] Other Clinician: Referring Tonie Vizcarrondo: [name] Treating Somya Jauregui/Extender: Frann Rider in Treatment: 12 Encounter Discharge Information Items Discharge Pain Level: 0 Discharge Condition:  Stable Ambulatory Status: Cane Discharge Destination: Home Private Transportation: Auto Accompanied By: self Schedule Follow-up Appointment: Yes Medication Reconciliation completed and provided No to Patient/Care Marcel Sorter: Clinical Summary of Care: Electronic Signature(s) Signed: 07/19/2017 4:20:42 PM By: Alric Quan Entered By: Alric Quan on 07/19/2017 08:51:22 Corey West ([id number]) -------------------------------------------------------------------------------- Patient/Caregiver Education Details Patient Name: Corey West Date of Service: 07/19/2017 8:45 AM Medical Record Number: [id number] Patient Account Number: [id number] Date of Birth/Gender: 1951-02-06 (66 y.o. Male) Treating RN: Ahmed Prima Primary Care Physician: [name] Other Clinician: Referring Physician: [name] Treating Physician/Extender: Frann Rider in Treatment: 12 Education Assessment Education Provided To: Patient Education Topics Provided Wound/Skin Impairment: Handouts: Other: do not get wraps wet Methods: Demonstration, Explain/Verbal Responses: State content correctly Electronic Signature(s) Signed: 07/19/2017 4:20:42 PM By: Alric Quan Entered By: Alric Quan on 07/19/2017 08:50:57 Corey West ([id number]) -------------------------------------------------------------------------------- Wound Assessment Details Patient Name: Corey Cower A. Date of Service: 07/19/2017 8:45 AM Medical Record Number: [id number] Patient Account Number: [id number] Date of Birth/Sex: 07-22-1951 (66 y.o. 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 Event:  Gradually Appeared Etiology: Extremity Date Acquired: 01/14/2017 Wound Status: Open Weeks Of Treatment: 12 Comorbid Cataracts, Anemia, Sleep Apnea, Clustered Wound: No History: Hypertension, Cirrhosis , Type II Diabetes Photos Photo Uploaded By: Alric Quan on 07/19/2017 16:13:26 Wound Measurements Length: (cm) 10.5 Width: (cm) 49 Depth: (cm) 0.2 Area: (cm) 404.087 Volume: (cm) 80.817 % Reduction in Area: -950% % Reduction in Volume: -110% Epithelialization: None Tunneling: No Undermining: No Wound Description Classification: Partial Thickness Wound Margin: Flat and Intact Exudate Amount: Large Exudate Type: Serous Exudate Color: amber Foul Odor After Cleansing: Yes Due to Product Use: No Slough/Fibrino Yes Wound Bed Granulation Amount: Medium (34-66%) Exposed Structure Granulation Quality: Pink Fascia Exposed: No Necrotic Amount: Medium (34-66%) Fat  Layer (Subcutaneous Tissue) Exposed: No Necrotic Quality: Adherent Slough Tendon Exposed: No Muscle Exposed: No Joint Exposed: No Bone Exposed: No Periwound Skin Texture Corey West, GOLOB. ([id number]) Texture Color No Abnormalities Noted: No No Abnormalities Noted: No Callus: No Atrophie Blanche: No Crepitus: No Cyanosis: No Excoriation: No Ecchymosis: No Induration: No Erythema: No Rash: No Hemosiderin Staining: No Scarring: No Mottled: No Pallor: No Moisture Rubor: No No Abnormalities Noted: No Dry / Scaly: No Temperature / Pain Maceration: Yes Temperature: No Abnormality Tenderness on Palpation: Yes Wound Preparation Ulcer Cleansing: Rinsed/Irrigated with Saline Topical Anesthetic Applied: None Treatment Notes Wound #3 (Right, Circumferential Lower Leg) 1. Cleansed with: Clean wound with Normal Saline Cleanse wound with antibacterial soap and water 4. Dressing Applied: Aquacel Ag 5. Secondary Dressing Applied ABD Pad 7. Secured with Tape 3 Layer Compression System -  Bilateral Notes unna to anchor, Manufacturing systems engineer) Signed: 07/19/2017 4:20:42 PM By: Alric Quan Entered By: Alric Quan on 07/19/2017 08:49:38 Corey West ([id number]) -------------------------------------------------------------------------------- Wound Assessment Details Patient Name: Corey West, Corey A. Date of Service: 07/19/2017 8:45 AM Medical Record Number: [id number] Patient Account Number: [id number] Date of Birth/Sex: 1951-08-08 (66 y.o. Male) Treating RN: Carolyne Fiscal, Debi Primary Care Faiga Stones: [name] Other Clinician: Referring Pedrohenrique Mccon[place]: [name] Treating Makenzie Weisner/Extender: Frann Rider in Treatment: 12 Wound Status Wound Number: 5 Primary Lymphedema Etiology: Wound Location: Left Lower Leg - Anterior Wound Open Wounding Event: Gradually Appeared Status: Date Acquired: 07/02/2017 Comorbid Cataracts, Anemia, Sleep Apnea, Weeks Of Treatment: 2 History: Hypertension, Cirrhosis , Type II Diabetes Clustered Wound: Yes Photos Photo Uploaded By: Alric Quan on 07/19/2017 16:10:55 Wound Measurements Length: (cm) 9.5 % Re Width: (cm) 5 % Re Depth: (cm) 0.2 Epit Clustered Quantity: 2 Tunn Area: (cm) 37.306 Und Volume: (cm) 7.461 duction in Area: -452.4% duction in Volume: -1005.3% helialization: None eling: No ermining: No Wound Description Classification: Partial Thickness Wound Margin: Distinct, outline attached Exudate Amount: Large Exudate Type: Serous Exudate Color: amber Foul Odor After Cleansing: No Slough/Fibrino Yes Wound Bed Granulation Amount: Large (67-100%) Granulation Quality: Pink Necrotic Amount: Small (1-33%) Necrotic Quality: Adherent Slough Periwound Skin Texture Texture Color No Abnormalities Noted: No No Abnormalities Noted: No Corey West, Corey West ([id number]) Moisture Temperature / Pain No Abnormalities Noted: No Temperature: No Abnormality Maceration: Yes Tenderness on Palpation:  Yes Wound Preparation Ulcer Cleansing: Rinsed/Irrigated with Saline Topical Anesthetic Applied: None Treatment Notes Wound #5 (Left, Anterior Lower Leg) 1. Cleansed with: Clean wound with Normal Saline Cleanse wound with antibacterial soap and water 4. Dressing Applied: Aquacel Ag 5. Secondary Dressing Applied ABD Pad 7. Secured with Tape 3 Layer Compression System - Bilateral Notes unna to anchor, Manufacturing systems engineer) Signed: 07/19/2017 4:20:42 PM By: Alric Quan Entered By: Alric Quan on 07/19/2017 08:49:50

## 2017-07-23 ENCOUNTER — Other Ambulatory Visit: Payer: Self-pay | Admitting: *Deleted

## 2017-07-23 ENCOUNTER — Encounter
Admission: RE | Admit: 2017-07-23 | Discharge: 2017-07-23 | Disposition: A | Discharge status: Home / Self Care | Payer: Medicare Other | Source: Ambulatory Visit | Attending: Unknown Physician Specialty | Admitting: Unknown Physician Specialty

## 2017-07-23 ENCOUNTER — Encounter: Payer: Medicare Other | Admitting: Occupational Therapy

## 2017-07-23 DIAGNOSIS — D500 Iron deficiency anemia secondary to blood loss (chronic): Secondary | ICD-10-CM | POA: Diagnosis not present

## 2017-07-23 DIAGNOSIS — K7460 Unspecified cirrhosis of liver: Secondary | ICD-10-CM | POA: Diagnosis not present

## 2017-07-23 DIAGNOSIS — Q2733 Arteriovenous malformation of digestive system vessel: Secondary | ICD-10-CM | POA: Diagnosis not present

## 2017-07-23 LAB — HEMOGLOBIN: HEMOGLOBIN: 7.7 g/dL — AB (ref 13.0–18.0)

## 2017-07-24 ENCOUNTER — Ambulatory Visit
Admission: RE | Admit: 2017-07-24 | Discharge: 2017-07-24 | Disposition: A | Discharge status: Home / Self Care | Payer: Medicare Other | Source: Ambulatory Visit | Attending: Unknown Physician Specialty | Admitting: Unknown Physician Specialty

## 2017-07-24 ENCOUNTER — Ambulatory Visit: Payer: Medicare Other | Admitting: Internal Medicine

## 2017-07-24 DIAGNOSIS — D500 Iron deficiency anemia secondary to blood loss (chronic): Secondary | ICD-10-CM | POA: Insufficient documentation

## 2017-07-24 DIAGNOSIS — Q2733 Arteriovenous malformation of digestive system vessel: Secondary | ICD-10-CM | POA: Insufficient documentation

## 2017-07-24 DIAGNOSIS — K7460 Unspecified cirrhosis of liver: Secondary | ICD-10-CM | POA: Insufficient documentation

## 2017-07-24 LAB — PREPARE RBC (CROSSMATCH)

## 2017-07-24 MED ORDER — SODIUM CHLORIDE 0.9 % IV SOLN
INTRAVENOUS | Status: DC
  Administered 2017-07-24: 08:00:00 via INTRAVENOUS

## 2017-07-25 ENCOUNTER — Encounter: Payer: Medicare Other | Admitting: Occupational Therapy

## 2017-07-25 LAB — BPAM RBC
BLOOD PRODUCT EXPIRATION DATE: 201811122359
Blood Product Expiration Date: 201811012359
ISSUE DATE / TIME: 201810240943
ISSUE DATE / TIME: 201810240733
UNIT TYPE AND RH: 600
Unit Type and Rh: 600

## 2017-07-25 LAB — TYPE AND SCREEN
ABO/RH(D): A NEG
Antibody Screen: NEGATIVE
EXTEND SAMPLE REASON: TRANSFUSED
UNIT DIVISION: 0
Unit division: 0

## 2017-07-31 ENCOUNTER — Ambulatory Visit: Payer: Medicare Other | Admitting: Anesthesiology

## 2017-07-31 ENCOUNTER — Encounter
Admission: RE | Disposition: A | Discharge status: Home / Self Care | Payer: Self-pay | Source: Ambulatory Visit | Attending: Unknown Physician Specialty

## 2017-07-31 ENCOUNTER — Encounter: Payer: Self-pay | Admitting: *Deleted

## 2017-07-31 ENCOUNTER — Ambulatory Visit
Admission: RE | Admit: 2017-07-31 | Discharge: 2017-07-31 | Disposition: A | Discharge status: Home / Self Care | Payer: Medicare Other | Source: Ambulatory Visit | Attending: Unknown Physician Specialty | Admitting: Unknown Physician Specialty

## 2017-07-31 DIAGNOSIS — I129 Hypertensive chronic kidney disease with stage 1 through stage 4 chronic kidney disease, or unspecified chronic kidney disease: Secondary | ICD-10-CM | POA: Insufficient documentation

## 2017-07-31 DIAGNOSIS — D649 Anemia, unspecified: Secondary | ICD-10-CM | POA: Insufficient documentation

## 2017-07-31 DIAGNOSIS — N189 Chronic kidney disease, unspecified: Secondary | ICD-10-CM | POA: Diagnosis not present

## 2017-07-31 DIAGNOSIS — E1122 Type 2 diabetes mellitus with diabetic chronic kidney disease: Secondary | ICD-10-CM | POA: Insufficient documentation

## 2017-07-31 DIAGNOSIS — F329 Major depressive disorder, single episode, unspecified: Secondary | ICD-10-CM | POA: Diagnosis not present

## 2017-07-31 DIAGNOSIS — F17290 Nicotine dependence, other tobacco product, uncomplicated: Secondary | ICD-10-CM | POA: Diagnosis not present

## 2017-07-31 DIAGNOSIS — K219 Gastro-esophageal reflux disease without esophagitis: Secondary | ICD-10-CM | POA: Diagnosis not present

## 2017-07-31 DIAGNOSIS — G4730 Sleep apnea, unspecified: Secondary | ICD-10-CM | POA: Diagnosis not present

## 2017-07-31 DIAGNOSIS — Z79899 Other long term (current) drug therapy: Secondary | ICD-10-CM | POA: Insufficient documentation

## 2017-07-31 DIAGNOSIS — F419 Anxiety disorder, unspecified: Secondary | ICD-10-CM | POA: Insufficient documentation

## 2017-07-31 DIAGNOSIS — K31819 Angiodysplasia of stomach and duodenum without bleeding: Secondary | ICD-10-CM | POA: Diagnosis not present

## 2017-07-31 DIAGNOSIS — Z794 Long term (current) use of insulin: Secondary | ICD-10-CM | POA: Diagnosis not present

## 2017-07-31 DIAGNOSIS — K2270 Barrett's esophagus without dysplasia: Secondary | ICD-10-CM | POA: Diagnosis present

## 2017-07-31 DIAGNOSIS — K7460 Unspecified cirrhosis of liver: Secondary | ICD-10-CM | POA: Diagnosis not present

## 2017-07-31 HISTORY — PX: ESOPHAGOGASTRODUODENOSCOPY (EGD) WITH PROPOFOL: SHX5813

## 2017-07-31 LAB — CBC WITH DIFFERENTIAL/PLATELET
BASOS PCT: 0 %
Basophils Absolute: 0 10*3/uL (ref 0–0.1)
EOS ABS: 0.1 10*3/uL (ref 0–0.7)
EOS PCT: 2 %
HCT: 24.4 % — ABNORMAL LOW (ref 40.0–52.0)
HEMOGLOBIN: 8.3 g/dL — AB (ref 13.0–18.0)
LYMPHS ABS: 0.8 10*3/uL — AB (ref 1.0–3.6)
LYMPHS PCT: 12 %
MCH: 30.4 pg (ref 26.0–34.0)
MCHC: 34.1 g/dL (ref 32.0–36.0)
MCV: 89.2 fL (ref 80.0–100.0)
MONOS PCT: 8 %
Monocytes Absolute: 0.5 10*3/uL (ref 0.2–1.0)
NEUTROS PCT: 78 %
Neutro Abs: 5.1 10*3/uL (ref 1.4–6.5)
PLATELETS: 109 10*3/uL — AB (ref 150–440)
RBC: 2.73 MIL/uL — ABNORMAL LOW (ref 4.40–5.90)
RDW: 17.5 % — AB (ref 11.5–14.5)
WBC: 6.5 10*3/uL (ref 3.8–10.6)

## 2017-07-31 LAB — PROTIME-INR
INR: 1.22
PROTHROMBIN TIME: 15.3 s — AB (ref 11.4–15.2)

## 2017-07-31 LAB — GLUCOSE, CAPILLARY: Glucose-Capillary: 228 mg/dL — ABNORMAL HIGH (ref 65–99)

## 2017-07-31 SURGERY — ESOPHAGOGASTRODUODENOSCOPY (EGD) WITH PROPOFOL
Anesthesia: General

## 2017-07-31 MED ORDER — IPRATROPIUM-ALBUTEROL 0.5-2.5 (3) MG/3ML IN SOLN
3.0000 mL | Freq: Once | RESPIRATORY_TRACT | Status: AC
  Administered 2017-07-31: 3 mL via RESPIRATORY_TRACT

## 2017-07-31 MED ORDER — PROPOFOL 500 MG/50ML IV EMUL
INTRAVENOUS | Status: DC | PRN
  Administered 2017-07-31: 140 ug/kg/min via INTRAVENOUS

## 2017-07-31 MED ORDER — LIDOCAINE HCL (CARDIAC) 20 MG/ML IV SOLN
INTRAVENOUS | Status: DC | PRN
  Administered 2017-07-31: 50 mg via INTRAVENOUS

## 2017-07-31 MED ORDER — IPRATROPIUM-ALBUTEROL 0.5-2.5 (3) MG/3ML IN SOLN
RESPIRATORY_TRACT | Status: AC
  Filled 2017-07-31: qty 3

## 2017-07-31 MED ORDER — FENTANYL CITRATE (PF) 100 MCG/2ML IJ SOLN
INTRAMUSCULAR | Status: DC | PRN
  Administered 2017-07-31: 50 ug via INTRAVENOUS

## 2017-07-31 MED ORDER — LIDOCAINE HCL (PF) 2 % IJ SOLN
INTRAMUSCULAR | Status: AC
  Filled 2017-07-31: qty 10

## 2017-07-31 MED ORDER — BUTAMBEN-TETRACAINE-BENZOCAINE 2-2-14 % EX AERO
INHALATION_SPRAY | CUTANEOUS | Status: DC | PRN
  Administered 2017-07-31: 2 via TOPICAL

## 2017-07-31 MED ORDER — PROPOFOL 500 MG/50ML IV EMUL
INTRAVENOUS | Status: AC
  Filled 2017-07-31: qty 50

## 2017-07-31 MED ORDER — MIDAZOLAM HCL 2 MG/2ML IJ SOLN
INTRAMUSCULAR | Status: AC
  Filled 2017-07-31: qty 2

## 2017-07-31 MED ORDER — SODIUM CHLORIDE 0.9 % IV SOLN
INTRAVENOUS | Status: DC
  Administered 2017-07-31: 08:00:00 via INTRAVENOUS

## 2017-07-31 MED ORDER — SODIUM CHLORIDE 0.9 % IV SOLN: INTRAVENOUS | Status: DC

## 2017-07-31 MED ORDER — FENTANYL CITRATE (PF) 100 MCG/2ML IJ SOLN
INTRAMUSCULAR | Status: AC
  Filled 2017-07-31: qty 2

## 2017-07-31 MED ORDER — MIDAZOLAM HCL 2 MG/2ML IJ SOLN
INTRAMUSCULAR | Status: DC | PRN
  Administered 2017-07-31: 2 mg via INTRAVENOUS

## 2017-07-31 NOTE — H&P (Signed)
Primary Care Physician:  Baxter Hire, MD Primary Gastroenterologist:  Dr. Vira Agar  Pre-Procedure History & Physical: HPI:  Corey West is a 66 y.o. male is here for an endoscopy.   Past Medical History:  Diagnosis Date  . Anemia   . Anxiety    controlled;   . Arthritis   . AVM (arteriovenous malformation) of stomach, acquired with hemorrhage   . Barrett's esophagus   . Chronic kidney disease    renal infufficiency  . Cirrhosis ([place])   . Depression    controlled;   Marland Kitchen Diabetes mellitus without complication ([place])    not controlled, taking insulin but sugar continues to run high;   . Esophageal varices ([place])   . GERD (gastroesophageal reflux disease)   . History of hiatal hernia   . Hypertension    controlled well;   . Polyp, stomach    with chronic blood loss  . Sleep apnea    does not wear a cpap, Medicare would not pay for it   . Venous stasis of both lower extremities    with cellulitis    Past Surgical History:  Procedure Laterality Date  . ESOPHAGOGASTRODUODENOSCOPY N/A 02/09/2015   Procedure: ESOPHAGOGASTRODUODENOSCOPY (EGD);  Surgeon: Manya Silvas, MD;  Location: [facility] ENDOSCOPY;  Service: Endoscopy;  Laterality: N/A;  . ESOPHAGOGASTRODUODENOSCOPY N/A 07/22/2015   Procedure: ESOPHAGOGASTRODUODENOSCOPY (EGD);  Surgeon: Manya Silvas, MD;  Location: [facility] ENDOSCOPY;  Service: Endoscopy;  Laterality: N/A;  . ESOPHAGOGASTRODUODENOSCOPY N/A 06/26/2017   Procedure: ESOPHAGOGASTRODUODENOSCOPY (EGD);  Surgeon: Manya Silvas, MD;  Location: [facility] ENDOSCOPY;  Service: Endoscopy;  Laterality: N/A;  . ESOPHAGOGASTRODUODENOSCOPY N/A 06/27/2017   Procedure: ESOPHAGOGASTRODUODENOSCOPY (EGD);  Surgeon: Manya Silvas, MD;  Location: [facility] ENDOSCOPY;  Service: Endoscopy;  Laterality: N/A;  . ESOPHAGOGASTRODUODENOSCOPY N/A 06/28/2017   Procedure: ESOPHAGOGASTRODUODENOSCOPY (EGD);  Surgeon: Manya Silvas, MD;  Location: [facility] ENDOSCOPY;  Service: Endoscopy;   Laterality: N/A;  . ESOPHAGOGASTRODUODENOSCOPY (EGD) WITH PROPOFOL N/A 07/20/2015   Procedure: ESOPHAGOGASTRODUODENOSCOPY (EGD) WITH PROPOFOL;  Surgeon: Manya Silvas, MD;  Location: [facility] ENDOSCOPY;  Service: Endoscopy;  Laterality: N/A;  . ESOPHAGOGASTRODUODENOSCOPY (EGD) WITH PROPOFOL N/A 09/16/2015   Procedure: ESOPHAGOGASTRODUODENOSCOPY (EGD) WITH PROPOFOL;  Surgeon: Manya Silvas, MD;  Location: [facility] ENDOSCOPY;  Service: Endoscopy;  Laterality: N/A;  . ESOPHAGOGASTRODUODENOSCOPY (EGD) WITH PROPOFOL N/A 03/16/2016   Procedure: ESOPHAGOGASTRODUODENOSCOPY (EGD) WITH PROPOFOL;  Surgeon: Manya Silvas, MD;  Location: [facility] ENDOSCOPY;  Service: Endoscopy;  Laterality: N/A;  . ESOPHAGOGASTRODUODENOSCOPY (EGD) WITH PROPOFOL N/A 09/14/2016   Procedure: ESOPHAGOGASTRODUODENOSCOPY (EGD) WITH PROPOFOL;  Surgeon: Manya Silvas, MD;  Location: [facility] ENDOSCOPY;  Service: Endoscopy;  Laterality: N/A;  . ESOPHAGOGASTRODUODENOSCOPY (EGD) WITH PROPOFOL N/A 11/05/2016   Procedure: ESOPHAGOGASTRODUODENOSCOPY (EGD) WITH PROPOFOL;  Surgeon: Manya Silvas, MD;  Location: [facility] ENDOSCOPY;  Service: Endoscopy;  Laterality: N/A;  . ESOPHAGOGASTRODUODENOSCOPY (EGD) WITH PROPOFOL N/A 02/06/2017   Procedure: ESOPHAGOGASTRODUODENOSCOPY (EGD) WITH PROPOFOL;  Surgeon: Manya Silvas, MD;  Location: [facility] ENDOSCOPY;  Service: Endoscopy;  Laterality: N/A;  . ESOPHAGOGASTRODUODENOSCOPY (EGD) WITH PROPOFOL N/A 04/24/2017   Procedure: ESOPHAGOGASTRODUODENOSCOPY (EGD) WITH PROPOFOL;  Surgeon: Manya Silvas, MD;  Location: [facility] ENDOSCOPY;  Service: Endoscopy;  Laterality: N/A;  . TONSILLECTOMY    . TONSILLECTOMY AND ADENOIDECTOMY    . ULNAR NERVE TRANSPOSITION    . UVULOPALATOPHARYNGOPLASTY      Prior to Admission medications   Medication Sig Start Date End Date Taking? Authorizing Provider  Ferrous Sulfate 140 (45 FE) MG TBCR Take 140  mg by mouth daily.    Yes [provider]  furosemide (LASIX) 40 MG  tablet Take 40 mg by mouth 2 (two) times daily.   Yes [provider]  lactulose (CHRONULAC) 10 GM/15ML solution Take 40 g by mouth at bedtime.    Yes [provider]  LORazepam (ATIVAN) 0.5 MG tablet Take 0.5 mg by mouth 3 (three) times daily as needed for anxiety.    Yes [provider]  omeprazole (PRILOSEC) 40 MG capsule Take 40 mg by mouth 2 (two) times daily.   Yes [provider]  rifaximin (XIFAXAN) 550 MG TABS tablet Take 550 mg by mouth 2 (two) times daily.   Yes [provider]  spironolactone (ALDACTONE) 25 MG tablet Take 25 mg by mouth daily.   Yes [provider]  sucralfate (CARAFATE) 1 g tablet Take 1 g by mouth 3 (three) times daily.    Yes [provider]  ammonium lactate (AMLACTIN) 12 % cream Apply topically 2 (two) times daily.    [provider]  insulin aspart protamine- aspart (NOVOLOG MIX 70/30) (70-30) 100 UNIT/ML injection Inject 0.5 mLs (50 Units total) into the skin 2 (two) times daily with a meal. Pt uses 84 units with breakfast and 62 units with supper. Patient taking differently: Inject 65-84 Units into the skin 2 (two) times daily with a meal. Pt uses 84 units with breakfast and 62 units with supper.  02/22/17   Fritzi Mandes, MD  traMADol (ULTRAM) 50 MG tablet Take 50 mg by mouth every 8 (eight) hours as needed for moderate pain.     [provider]    Allergies as of 07/25/2017  . (No Known Allergies)    Family History  Problem Relation Age of Onset  . Diabetes Other     Social History   Social History  . Marital status: Single    Spouse name: N/A  . Number of children: N/A  . Years of education: N/A   Occupational History  . Not on file.   Social History Main Topics  . Smoking status: Current Some Day Smoker    Types: Cigars  . Smokeless tobacco: Never Used     Comment: smoke 1 at night occasionally;   . Alcohol use No     Comment: stopped 12 years ago  . Drug use:  No  . Sexual activity: Not on file   Other Topics Concern  . Not on file   Social History Narrative  . No narrative on file    Review of Systems: See HPI, otherwise negative ROS  Physical Exam: BP (!) 171/58   Pulse 68   Temp 98.5 F (36.9 C) (Tympanic)   Resp 20   Ht 5\' 7"  (1.702 m)   Wt 136.1 kg (300 lb)   SpO2 100%   BMI 46.99 kg/m  General:   Alert,  pleasant and cooperative in NAD Head:  Normocephalic and atraumatic. Neck:  Supple; no masses or thyromegaly. Lungs:  Clear throughout to auscultation.    Heart:  Regular rate and rhythm. Abdomen:  Soft, nontender and nondistended. Normal bowel sounds, without guarding, and without rebound.   Neurologic:  Alert and  oriented x4;  grossly normal neurologically.  Impression/Plan: [name] is here for an endoscopy to be performed for treatment of gastric AVM which cause chronic anemia.  Risks, benefits, limitations, and alternatives regarding  endoscopy have been reviewed with the patient.  Questions have been answered.  All parties  agreeable.   [name], MD  07/31/2017, 9:14 AM

## 2017-07-31 NOTE — Transfer of Care (Signed)
Immediate Anesthesia Transfer of Care Note  Patient: Corey West  Procedure(s) Performed: ESOPHAGOGASTRODUODENOSCOPY (EGD) WITH PROPOFOL (N/A )  Patient Location: PACU  Anesthesia Type:General  Level of Consciousness: awake and sedated  Airway & Oxygen Therapy: Patient Spontanous Breathing and Patient connected to nasal cannula oxygen  Post-op Assessment: Report given to RN and Post -op Vital signs reviewed and stable  Post vital signs: Reviewed and stable  Last Vitals:  Vitals:   07/31/17 0733  BP: (!) 171/58  Pulse: 68  Resp: 20  Temp: 36.9 C  SpO2: 100%    Last Pain:  Vitals:   07/31/17 0733  TempSrc: Tympanic         Complications: No apparent anesthesia complications

## 2017-07-31 NOTE — Anesthesia Procedure Notes (Signed)
Performed by: Vaughan Sine Pre-anesthesia Checklist: Patient identified, Suction available, Emergency Drugs available, Patient being monitored and Timeout performed Patient Re-evaluated:Patient Re-evaluated prior to induction Oxygen Delivery Method: Nasal cannula Preoxygenation: Pre-oxygenation with 100% oxygen Induction Type: IV induction Airway Equipment and Method: Bite block Placement Confirmation: positive ETCO2 and CO2 detector

## 2017-07-31 NOTE — Anesthesia Post-op Follow-up Note (Signed)
Anesthesia QCDR form completed.        

## 2017-07-31 NOTE — Anesthesia Postprocedure Evaluation (Signed)
Anesthesia Post Note  Patient: Corey West  Procedure(s) Performed: ESOPHAGOGASTRODUODENOSCOPY (EGD) WITH PROPOFOL (N/A )  Patient location during evaluation: PACU Anesthesia Type: General Level of consciousness: awake Pain management: pain level controlled Vital Signs Assessment: post-procedure vital signs reviewed and stable Respiratory status: spontaneous breathing Cardiovascular status: stable Anesthetic complications: no     Last Vitals:  Vitals:   07/31/17 1020 07/31/17 1030  BP: 140/62 (!) 146/63  Pulse: 74 73  Resp: 15 17  Temp:    SpO2: 100% 100%    Last Pain:  Vitals:   07/31/17 1030  TempSrc:   PainSc: 0-No pain                 VAN STAVEREN,Adalyn Pennock

## 2017-07-31 NOTE — Op Note (Addendum)
[facility] Gastroenterology Patient Name: Corey West Procedure Date: 07/31/2017 9:04 AM MRN: [id number] Account #: [id number] Date of Birth: 11/20/1950 Admit Type: Outpatient Age: 66 Room: [facility] ENDO ROOM 3 Gender: Male Note Status: Finalized Procedure:            Upper GI endoscopy Indications:          Recent gastrointestinal bleeding, Suspected upper                        gastrointestinal bleeding in patient with chronic blood                        loss Providers:            Manya Silvas, MD Referring MD:         Andres Labrum, MD (Referring MD) Medicines:            Propofol per Anesthesia Complications:        No immediate complications. Procedure:            Pre-Anesthesia Assessment:                       - After reviewing the risks and benefits, the patient                        was deemed in satisfactory condition to undergo the                        procedure.                       - The anesthesia plan was to use moderate                        sedation/analgesia (conscious sedation).                       After obtaining informed consent, the endoscope was                        passed under direct vision. Throughout the procedure,                        the patient's blood pressure, pulse, and oxygen                        saturations were monitored continuously. The Endoscope                        was introduced through the mouth, and advanced to the                        prepyloric region, stomach. The upper GI endoscopy was                        accomplished without difficulty. The patient tolerated                        the procedure well. Findings:      There were esophageal mucosal changes secondary to established       short-segment Barrett's disease present in the lower third  of the       esophagus. The maximum longitudinal extent of these mucosal changes was       3 cm in length.      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 Coagulation for bleeding       prevention of a large clot using argon plasma at 0.4 liters/minute and       20 watts was successful. Two other areas treated also.      Overall stomach looks better than recent conditions. Impression:           - Esophageal mucosal changes secondary to established                        short-segment Barrett's disease.                       - Gastric antral vascular ectasia without bleeding.                        Treated with argon plasma coagulation (APC).                       - No specimens collected. Recommendation:       - The findings and recommendations were discussed with                        the patient's family. Manya Silvas, MD 07/31/2017 9:37:30 AM This report has been signed electronically. Number of Addenda: 0 Note Initiated On: 07/31/2017 9:04 AM      [facility]

## 2017-07-31 NOTE — Anesthesia Preprocedure Evaluation (Signed)
Anesthesia Evaluation  Patient identified by MRN, date of birth, ID band Patient awake    Reviewed: Allergy & Precautions, NPO status , Patient's Chart, lab work & pertinent test results  Airway Mallampati: III       Dental  (+) Teeth Intact   Pulmonary sleep apnea , COPD, Current Smoker,     + decreased breath sounds      Cardiovascular hypertension, Pt. on medications + Peripheral Vascular Disease   Rhythm:Irregular     Neuro/Psych Anxiety Depression    GI/Hepatic hiatal hernia, GERD  Medicated,(+) Cirrhosis   Esophageal Varices    ,   Endo/Other  diabetes, Type 1, Insulin DependentMorbid obesity  Renal/GU      Musculoskeletal   Abdominal (+) + obese,   Peds negative pediatric ROS (+)  Hematology  (+) anemia ,   Anesthesia Other Findings   Reproductive/Obstetrics                             Anesthesia Physical Anesthesia Plan  ASA: III  Anesthesia Plan: General   Post-op Pain Management:    Induction: Intravenous  PONV Risk Score and Plan:   Airway Management Planned: Natural Airway and Nasal Cannula  Additional Equipment:   Intra-op Plan:   Post-operative Plan:   Informed Consent: I have reviewed the patients History and Physical, chart, labs and discussed the procedure including the risks, benefits and alternatives for the proposed anesthesia with the patient or authorized representative who has indicated his/her understanding and acceptance.     Plan Discussed with: CRNA  Anesthesia Plan Comments:         Anesthesia Quick Evaluation

## 2017-08-02 ENCOUNTER — Encounter: Payer: Self-pay | Admitting: Unknown Physician Specialty

## 2017-08-12 ENCOUNTER — Encounter: Payer: Medicare Other | Attending: Surgery | Admitting: Surgery

## 2017-08-12 DIAGNOSIS — Z6841 Body Mass Index (BMI) 40.0 and over, adult: Secondary | ICD-10-CM | POA: Diagnosis not present

## 2017-08-12 DIAGNOSIS — D649 Anemia, unspecified: Secondary | ICD-10-CM | POA: Insufficient documentation

## 2017-08-12 DIAGNOSIS — F17290 Nicotine dependence, other tobacco product, uncomplicated: Secondary | ICD-10-CM | POA: Insufficient documentation

## 2017-08-12 DIAGNOSIS — I87331 Chronic venous hypertension (idiopathic) with ulcer and inflammation of right lower extremity: Secondary | ICD-10-CM | POA: Diagnosis not present

## 2017-08-12 DIAGNOSIS — E11622 Type 2 diabetes mellitus with other skin ulcer: Secondary | ICD-10-CM | POA: Insufficient documentation

## 2017-08-12 DIAGNOSIS — K7460 Unspecified cirrhosis of liver: Secondary | ICD-10-CM | POA: Insufficient documentation

## 2017-08-12 DIAGNOSIS — I890 Lymphedema, not elsewhere classified: Secondary | ICD-10-CM | POA: Diagnosis not present

## 2017-08-12 DIAGNOSIS — I10 Essential (primary) hypertension: Secondary | ICD-10-CM | POA: Diagnosis not present

## 2017-08-12 DIAGNOSIS — L97211 Non-pressure chronic ulcer of right calf limited to breakdown of skin: Secondary | ICD-10-CM | POA: Insufficient documentation

## 2017-08-12 DIAGNOSIS — Z794 Long term (current) use of insulin: Secondary | ICD-10-CM | POA: Diagnosis not present

## 2017-08-12 DIAGNOSIS — G4733 Obstructive sleep apnea (adult) (pediatric): Secondary | ICD-10-CM | POA: Diagnosis not present

## 2017-08-12 DIAGNOSIS — I899 Noninfective disorder of lymphatic vessels and lymph nodes, unspecified: Secondary | ICD-10-CM | POA: Diagnosis not present

## 2017-08-13 NOTE — Progress Notes (Signed)
DEVONTRE, SIEDSCHLAG ([id number]) Visit Report for 08/12/2017 Chief Complaint Document Details Patient Name: Corey West, Corey West. Date of Service: 08/12/2017 3:30 PM Medical Record Number: [id number] Patient Account Number: [id number] Date of Birth/Sex: [birth year] (66 y.o. Male) Treating RN: Ahmed Prima Primary Care Provider: [name] Other Clinician: Referring Provider: [name] Treating Provider/Extender: Frann Rider in Treatment: 15 Information Obtained from: Patient Chief Complaint LLE VSU since Jan 2015 04/23/17; patient is here for review of wounds on his right lateral and right posterior calf Electronic Signature(s) Signed: 08/12/2017 4:25:23 PM By: Christin Fudge MD, FACS Entered By: Christin Fudge on 08/12/2017 16:25:23 Corey West ([id number]) -------------------------------------------------------------------------------- HPI Details Patient Name: Corey West Date of Service: 08/12/2017 3:30 PM Medical Record Number: [id number] Patient Account Number: [id number] Date of Birth/Sex: 1951/08/01 (66 y.o. Male) Treating RN: Ahmed Prima Primary Care Provider: [name] Other Clinician: Referring Provider: [name] Treating Provider/Extender: Frann Rider in Treatment: 15 History of Present Illness HPI Description: Pleasant 66 year old with West past medical history significant for morbid obesity, insulin-dependent type 2 diabetes (hemoglobin A1c 7.2 in January 2015), cirrhosis, and chronic kidney disease. He reports West left anterior calf ulcer since January 2015. He has had venous stasis ulcers in the past, which have healed spontaneously. No history of DVT. Does not regularly wear compression. Ambulating normally per his baseline. Recently completed West course of antibiotics. Treated with West Profore light and Coban 2 layer compression drainage, which both fell down and he cut off. Tolerated Coban lite compression bandage. Returns to clinic for  followup. No complaints today. No pain at this time. No fever or chills. No open wounds at this time. He is having difficulty finding compression garments that fit him. 04/23/17; this is now 66 year old man who returns to our clinic for nonhealing wounds on the right lateral and right posterior calf. He tells me that they have been present for 6 months. In the setting of severe right greater than left lower extremity lymphedema likely secondary to chronic venous insufficiency. He has been followed in the lymphedema clinic at least since early June with compression wraps although these have not really helped and the patient is here for our review of this situation. The patient has had problems with edema in his legs for years. He has not worn compression. He is type II diabetic that sounds poorly controlled. He also has obstructive sleep apnea and cirrhosis. He is occasional cigar smoker 04/30/17; patient is West 66 year old man who has severe bilateral lower extremity lymphedema stage III since his early 42s he tells me. This is no doubt secondary to chronic venous insufficiency. He was going to the lymphedema clinic who forwarded him here due to skin breakdown in the right leg. He has not previously worn compression stockings. He has West large area of denuded epithelium on the posterior leg. He has West more open wound on the right lateral leg 05/07/17 on evaluation today patient appears to be doing about the same his wounds are showing signs of improvement and looking fairly well. With that being said he is not having any severe discomfort just some mild aching due to the swelling and really this is worse on the left than the right and the right side is where he actually has his wounds. He has been tolerating the [employer] wraps without complication and states this is actually much better than the wraps that he was getting at the lymphedema clinic most recently. He is happy with these wraps. He has  no nausea,  vomiting, diarrhea and no fevers or chills. We are still waiting to hear back in regard to the lymphedema pumps which I do think would be beneficial for him even with the compression wraps he still has West significant amount of edema noted of the bilateral lower extremities. 05/15/17 on evaluation today patient's right lower extremity edema and weeping especially the posterior portion appears to be doing better. He still is having some issue here but fortunately it's minimal compared to previous weeks. He continues to tolerate the compression fairly well. Overall he is pleased with how things are progressing. No fevers, chills, nausea, or vomiting noted at this time. And patient is having only minimal discomfort. 05/22/17; the medial wound on the right leg actually looks considerably better and is epithelialized. He still has West small punched out area on the right lateral leg. Edema control is marginal end of the [employer]. We are working on getting him compression pumps 05/29/17; the patient's lymphedema pumps are apparently arriving tonight. In the meantime he took his Unna boots off last Sunday because the edema had progressed to the point that West word to titrate. Since then he has put nothing on his right leg and more specifically nothing on the right medial and lateral wounds 06/05/17; the patient has his lymphedema pumps and he is using them once West day on the right leg. There is West sizable reduction in the circumference of his leg although he arrived in clinic today somewhat short of breath. 07/02/17; since the patient was last in clinic he was admitted on 2 occasions. He was admitted from 9/12 through 9/13 with lactic acidosis weakness and felt to have left lower extremity cellulitis. Due to the elevated lactic acid level he was felt to have sepsis although this was ruled out and the lactic acidosis was felt secondary to his chronic cirrhosis. He was initially treated with IV vancomycin for cellulitis  and redness of his left leg but then this was subsequently recognized to be secondary to Corey West, Corey West. ([id number]) chronic venous stasis and chronic lymphedema. When he left the hospital he had Unna boots placed bilaterally. He was readmitted on 9/26 through 9/28 with GI bleeding. His EGD showed Barrett's esophagus and gastritis. He was discharged with West hemoglobin of 8 to follow-up with GI as an outpatient. There was nothing more about his lower extremities during this short hospital stay. The patient has not been using his external compression pumps fearing fluid overload and shortness of breath although he feels better now that his hemoglobin is stabilized. He continues to have epithelial loss on his bilateral lower extremities. 07/09/17; patient has West large but superficial area on the right posterior leg and on the left anterior leg. He tells me he is been using his external compression pumps once West day for the required hour. He states the pumps hurt his right ankle. 07/17/17; patient was transfused West further 2 units since he was here the last time. His hemoglobin was once again found to be low. He has not been able to use his external compression pumps because of West combination of prolonged power outage in his home and I think issues with his other medical care. His wounds have actually worsened he now has loss of epithelialization all the way from the right lateral to the right medial posteriorly. The left side which has anterior loss of epithelialization also appears to be worse on the lateral aspect of the leg where there is denuded epithelium  now as well. Leaking edema fluid. He has bilateral lymphedema. 08/12/2017 -- the patient has not been here for 25 days and has had the same compression wraps which have not been changed!! He says he was being worked up for West GI bleed and had some transfusions since he was seen last by Dr. Dellia Nims Electronic Signature(s) Signed: 08/12/2017 4:26:14 PM  By: Christin Fudge MD, FACS Entered By: Christin Fudge on 08/12/2017 16:26:13 Corey West ([id number]) -------------------------------------------------------------------------------- Physical Exam Details Patient Name: Corey West, Corey West. Date of Service: 08/12/2017 3:30 PM Medical Record Number: [id number] Patient Account Number: [id number] Date of Birth/Sex: 1950-11-08 (66 y.o. Male) Treating RN: Ahmed Prima Primary Care Provider: [name] Other Clinician: Referring Provider: [name] Treating Provider/Extender: Frann Rider in Treatment: 15 Constitutional . Pulse regular. Respirations normal and unlabored. Afebrile. . Eyes Nonicteric. Reactive to light. Ears, Nose, Mouth, and Throat Lips, teeth, and gums WNL.Marland Kitchen Moist mucosa without lesions. Neck supple and nontender. No palpable supraclavicular or cervical adenopathy. Normal sized without goiter. Respiratory WNL. No retractions.. Cardiovascular Pedal Pulses WNL. No clubbing, cyanosis or edema. Lymphatic No adneopathy. No adenopathy. No adenopathy. Musculoskeletal Adexa without tenderness or enlargement.. Digits and nails w/o clubbing, cyanosis, infection, petechiae, ischemia, or inflammatory conditions.. Integumentary (Hair, Skin) No suspicious lesions. No crepitus or fluctuance. No peri-wound warmth or erythema. No masses.Marland Kitchen Psychiatric Judgement and insight Intact.. No evidence of depression, anxiety, or agitation.. Notes the larger of the 2 ulcerations are merged in the right posterior lateral aspect of the calf. the left anterior lateral area has West small ulceration but his lymphedema still is West stage III and we will persist and application of silver alginate and West 4-layer Profore wrap Electronic Signature(s) Signed: 08/12/2017 4:27:28 PM By: Christin Fudge MD, FACS Entered By: Christin Fudge on 08/12/2017 16:27:28 Corey West  ([id number]) -------------------------------------------------------------------------------- Physician Orders Details Patient Name: Corey Cower West. Date of Service: 08/12/2017 3:30 PM Medical Record Number: [id number] Patient Account Number: [id number] Date of Birth/Sex: 23-Nov-1950 (66 y.o. Male) Treating RN: Ahmed Prima Primary Care Provider: [name] Other Clinician: Referring Provider: [name] Treating Provider/Extender: Frann Rider in Treatment: 15 Verbal / Phone Orders: Yes Clinician: Carolyne Fiscal, Debi Read Back and Verified: Yes Diagnosis Coding Wound Cleansing Wound #3 Right,Circumferential Lower Leg o Clean wound with Normal Saline. o Cleanse wound with mild soap and water Wound #5 Left,Anterior Lower Leg o Clean wound with Normal Saline. o Cleanse wound with mild soap and water Anesthetic Wound #3 Right,Circumferential Lower Leg o Topical Lidocaine 4% cream applied to wound bed prior to debridement Wound #5 Left,Anterior Lower Leg o Topical Lidocaine 4% cream applied to wound bed prior to debridement Primary Wound Dressing Wound #3 Right,Circumferential Lower Leg o Silvercel Non-Adherent Wound #5 Left,Anterior Lower Leg o Silvercel Non-Adherent Secondary Dressing Wound #3 Right,Circumferential Lower Leg o ABD pad o XtraSorb Wound #5 Left,Anterior Lower Leg o ABD pad o XtraSorb Dressing Change Frequency Wound #3 Right,Circumferential Lower Leg o Change dressing every week Wound #5 Left,Anterior Lower Leg o Change dressing every week Follow-up Appointments Wound #3 Right,Circumferential Lower Leg o Return Appointment in 1 week. o Nurse Visit as needed Corey West, Corey West ([id number]) Wound #5 Left,Anterior Lower Leg o Return Appointment in 1 week. o Nurse Visit as needed Edema Control Wound #3 Right,Circumferential Lower Leg o 3 Layer Compression System - Right Lower Extremity - unna to anchor o  Compression Pump: Use compression pump on left lower extremity for 30 minutes, twice daily. Wound #5 Left,Anterior Lower Leg   o 3 Layer Compression System - Right Lower Extremity - unna to anchor o Compression Pump: Use compression pump on left lower extremity for 30 minutes, twice daily. Additional Orders / Instructions Wound #3 Right,Circumferential Lower Leg o Increase protein intake. o Other: - Get Blood Sugars below 150 Wound #5 Left,Anterior Lower Leg o Increase protein intake. o Other: - Get Blood Sugars below 150 Electronic Signature(s) Signed: 08/12/2017 4:30:10 PM By: Christin Fudge MD, FACS Signed: 08/12/2017 5:35:20 PM By: Alric Quan Entered By: Alric Quan on 08/12/2017 16:09:55 Corey West ([id number]) -------------------------------------------------------------------------------- Problem List Details Patient Name: ROCKO, FESPERMAN West. Date of Service: 08/12/2017 3:30 PM Medical Record Number: [id number] Patient Account Number: [id number] Date of Birth/Sex: 06-09-1951 (66 y.o. Male) Treating RN: Ahmed Prima Primary Care Provider: [name] Other Clinician: Referring Provider: [name] Treating Provider/Extender: Frann Rider in Treatment: 15 Active Problems ICD-10 Encounter Code Description Active Date Diagnosis I89.0 Lymphedema, not elsewhere classified 04/23/2017 Yes L97.211 Non-pressure chronic ulcer of right calf limited to breakdown of skin 04/23/2017 Yes I87.331 Chronic venous hypertension (idiopathic) with ulcer and 04/23/2017 Yes inflammation of right lower extremity E11.622 Type 2 diabetes mellitus with other skin ulcer 04/23/2017 Yes I89.9 Noninfective disorder of lymphatic vessels and lymph nodes, 04/23/2017 Yes unspecified Inactive Problems Resolved Problems Electronic Signature(s) Signed: 08/12/2017 4:24:44 PM By: Christin Fudge MD, FACS Entered By: Christin Fudge on 08/12/2017 16:24:43 Corey West  ([id number]) -------------------------------------------------------------------------------- Progress Note Details Patient Name: Corey West Date of Service: 08/12/2017 3:30 PM Medical Record Number: [id number] Patient Account Number: [id number] Date of Birth/Sex: 10-Apr-1951 (66 y.o. Male) Treating RN: Carolyne Fiscal, Debi Primary Care Provider: [name] Other Clinician: Referring Provider: [name] Treating Provider/Extender: Frann Rider in Treatment: 15 Subjective Chief Complaint Information obtained from Patient LLE VSU since Jan 2015 04/23/17; patient is here for review of wounds on his right lateral and right posterior calf History of Present Illness (HPI) Pleasant 66 year old with West past medical history significant for morbid obesity, insulin-dependent type 2 diabetes (hemoglobin A1c 7.2 in January 2015), cirrhosis, and chronic kidney disease. He reports West left anterior calf ulcer since January 2015. He has had venous stasis ulcers in the past, which have healed spontaneously. No history of DVT. Does not regularly wear compression. Ambulating normally per his baseline. Recently completed West course of antibiotics. Treated with West Profore light and Coban 2 layer compression drainage, which both fell down and he cut off. Tolerated Coban lite compression bandage. Returns to clinic for followup. No complaints today. No pain at this time. No fever or chills. No open wounds at this time. He is having difficulty finding compression garments that fit him. 04/23/17; this is now 66 year old man who returns to our clinic for nonhealing wounds on the right lateral and right posterior calf. He tells me that they have been present for 6 months. In the setting of severe right greater than left lower extremity lymphedema likely secondary to chronic venous insufficiency. He has been followed in the lymphedema clinic at least since early June with compression wraps although these have not  really helped and the patient is here for our review of this situation. The patient has had problems with edema in his legs for years. He has not worn compression. He is type II diabetic that sounds poorly controlled. He also has obstructive sleep apnea and cirrhosis. He is occasional cigar smoker 04/30/17; patient is West 66 year old man who has severe bilateral lower extremity lymphedema stage III since his early 25s he tells  me. This is no doubt secondary to chronic venous insufficiency. He was going to the lymphedema clinic who forwarded him here due to skin breakdown in the right leg. He has not previously worn compression stockings. He has West large area of denuded epithelium on the posterior leg. He has West more open wound on the right lateral leg 05/07/17 on evaluation today patient appears to be doing about the same his wounds are showing signs of improvement and looking fairly well. With that being said he is not having any severe discomfort just some mild aching due to the swelling and really this is worse on the left than the right and the right side is where he actually has his wounds. He has been tolerating the [employer] wraps without complication and states this is actually much better than the wraps that he was getting at the lymphedema clinic most recently. He is happy with these wraps. He has no nausea, vomiting, diarrhea and no fevers or chills. We are still waiting to hear back in regard to the lymphedema pumps which I do think would be beneficial for him even with the compression wraps he still has West significant amount of edema noted of the bilateral lower extremities. 05/15/17 on evaluation today patient's right lower extremity edema and weeping especially the posterior portion appears to be doing better. He still is having some issue here but fortunately it's minimal compared to previous weeks. He continues to tolerate the compression fairly well. Overall he is pleased with how things are  progressing. No fevers, chills, nausea, or vomiting noted at this time. And patient is having only minimal discomfort. 05/22/17; the medial wound on the right leg actually looks considerably better and is epithelialized. He still has West small punched out area on the right lateral leg. Edema control is marginal end of the [employer]. We are working on getting him compression pumps 05/29/17; the patient's lymphedema pumps are apparently arriving tonight. In the meantime he took his Unna boots off last Sunday because the edema had progressed to the point that West word to titrate. Since then he has put nothing on his right leg and more specifically nothing on the right medial and lateral wounds 06/05/17; the patient has his lymphedema pumps and he is using them once West day on the right leg. There is West sizable reduction Corey West, Corey West. ([id number]) in the circumference of his leg although he arrived in clinic today somewhat short of breath. 07/02/17; since the patient was last in clinic he was admitted on 2 occasions. He was admitted from 9/12 through 9/13 with lactic acidosis weakness and felt to have left lower extremity cellulitis. Due to the elevated lactic acid level he was felt to have sepsis although this was ruled out and the lactic acidosis was felt secondary to his chronic cirrhosis. He was initially treated with IV vancomycin for cellulitis and redness of his left leg but then this was subsequently recognized to be secondary to chronic venous stasis and chronic lymphedema. When he left the hospital he had Unna boots placed bilaterally. He was readmitted on 9/26 through 9/28 with GI bleeding. His EGD showed Barrett's esophagus and gastritis. He was discharged with West hemoglobin of 8 to follow-up with GI as an outpatient. There was nothing more about his lower extremities during this short hospital stay. The patient has not been using his external compression pumps fearing fluid overload and shortness of  breath although he feels better now that his hemoglobin is  stabilized. He continues to have epithelial loss on his bilateral lower extremities. 07/09/17; patient has West large but superficial area on the right posterior leg and on the left anterior leg. He tells me he is been using his external compression pumps once West day for the required hour. He states the pumps hurt his right ankle. 07/17/17; patient was transfused West further 2 units since he was here the last time. His hemoglobin was once again found to be low. He has not been able to use his external compression pumps because of West combination of prolonged power outage in his home and I think issues with his other medical care. His wounds have actually worsened he now has loss of epithelialization all the way from the right lateral to the right medial posteriorly. The left side which has anterior loss of epithelialization also appears to be worse on the lateral aspect of the leg where there is denuded epithelium now as well. Leaking edema fluid. He has bilateral lymphedema. 08/12/2017 -- the patient has not been here for 25 days and has had the same compression wraps which have not been changed!! He says he was being worked up for West GI bleed and had some transfusions since he was seen last by Dr. Dellia Nims Patient History Information obtained from Patient. Family History Diabetes - Mother, Heart Disease - Father, Hypertension - Mother, Seizures - Father, No family history of Cancer, Kidney Disease, Lung Disease, Stroke, Thyroid Problems. Social History Light tobacco smoker, Alcohol Use - Never, Drug Use - No History, Caffeine Use - Moderate. Medical History Hospitalization/Surgery History - 10/28/2013, [facility], passed out and fell. Medical And Surgical History Notes Constitutional Symptoms (General Health) high bood pressure, liver function decline, diabetic, Cirrhosis of liver Genitourinary antibiotics for kidney infection Objective ANTWANN, PREZIOSI ([id number]) Constitutional Pulse regular. Respirations normal and unlabored. Afebrile. Vitals Time Taken: 3:48 PM, Height: 69 in, Weight: 305.4 lbs, BMI: 45.1, Temperature: 98.3 F, Pulse: 79 bpm, Respiratory Rate: 18 breaths/min, Blood Pressure: 141/97 mmHg. Eyes Nonicteric. Reactive to light. Ears, Nose, Mouth, and Throat Lips, teeth, and gums WNL.Marland Kitchen Moist mucosa without lesions. Neck supple and nontender. No palpable supraclavicular or cervical adenopathy. Normal sized without goiter. Respiratory WNL. No retractions.. Cardiovascular Pedal Pulses WNL. No clubbing, cyanosis or edema. Lymphatic No adneopathy. No adenopathy. No adenopathy. Musculoskeletal Adexa without tenderness or enlargement.. Digits and nails w/o clubbing, cyanosis, infection, petechiae, ischemia, or inflammatory conditions.Marland Kitchen Psychiatric Judgement and insight Intact.. No evidence of depression, anxiety, or agitation.. General Notes: the larger of the 2 ulcerations are merged in the right posterior lateral aspect of the calf. the left anterior lateral area has West small ulceration but his lymphedema still is West stage III and we will persist and application of silver alginate and West 4-layer Profore wrap Integumentary (Hair, Skin) No suspicious lesions. No crepitus or fluctuance. No peri-wound warmth or erythema. No masses.. Wound #3 status is Open. Original cause of wound was Gradually Appeared. The wound is located on the Right,Circumferential Lower Leg. The wound measures 11cm length x 44.7cm width x 0.1cm depth; 386.18cm^2 area and 38.618cm^3 volume. There is no tunneling or undermining noted. There is West large amount of serous drainage noted. Foul odor after cleansing was noted. The wound margin is flat and intact. There is medium (34-66%) pink granulation within the wound bed. There is West medium (34-66%) amount of necrotic tissue within the wound bed including Adherent Slough. The periwound skin appearance  exhibited: Maceration. The periwound skin appearance did not exhibit:  Callus, Crepitus, Excoriation, Induration, Rash, Scarring, Dry/Scaly, Atrophie Blanche, Cyanosis, Ecchymosis, Hemosiderin Staining, Mottled, Pallor, Rubor, Erythema. Periwound temperature was noted as No Abnormality. The periwound has tenderness on palpation. Wound #5 status is Open. Original cause of wound was Gradually Appeared. The wound is located on the Left,Anterior Lower Leg. The wound measures 11cm length x 7cm width x 0.1cm depth; 60.476cm^2 area and 6.048cm^3 volume. There is no tunneling or undermining noted. There is West large amount of serous drainage noted. The wound margin is distinct with the outline attached to the wound base. There is medium (34-66%) pink granulation within the wound bed. There is West medium (34-66%) amount of necrotic tissue within the wound bed including Adherent Slough. The periwound skin appearance exhibited: Maceration. Periwound temperature was noted as No Abnormality. The periwound has tenderness on palpation. 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 Cleanse wound with mild soap and water Wound #5 Left,Anterior Lower Leg: Clean wound with Normal Saline. Cleanse wound with mild soap and water Anesthetic: Wound #3 Right,Circumferential Lower Leg: Topical Lidocaine 4% cream applied to wound bed prior to debridement Wound #5 Left,Anterior Lower Leg: Topical Lidocaine 4% cream applied to wound bed prior to  debridement Primary Wound Dressing: Wound #3 Right,Circumferential Lower Leg: Silvercel Non-Adherent Wound #5 Left,Anterior Lower Leg: [place]cel Non-Adherent Secondary Dressing: Wound #3 Right,Circumferential Lower Leg: ABD pad XtraSorb Wound #5 Left,Anterior Lower Leg: ABD pad XtraSorb Dressing Change Frequency: Wound #3 Right,Circumferential Lower Leg: Change dressing every week Wound #5 Left,Anterior Lower Leg: Change dressing every week Follow-up Appointments: Wound #3 Right,Circumferential Lower Leg: Return Appointment in 1 week. Nurse Visit as needed Wound #5 Left,Anterior Lower Leg: Return Appointment in 1 week. Nurse Visit as needed Edema Control: Wound #3 Right,Circumferential Lower Leg: 3 Layer Compression System - Right Lower Extremity - unna to anchor Compression Pump: Use compression pump on left lower extremity for 30 minutes, twice daily. Corey West, Corey West ([id number]) Wound #5 Left,Anterior Lower Leg: 3 Layer Compression System - Right Lower Extremity - unna to anchor Compression Pump: Use compression pump on left lower extremity for 30 minutes, twice daily. Additional Orders / Instructions: Wound #3 Right,Circumferential Lower Leg: Increase protein intake. Other: - Get Blood Sugars below 150 Wound #5 Left,Anterior Lower Leg: Increase protein intake. Other: - Get Blood Sugars below 150 I had West discussion with the patient regarding the need for regular weekly visits to the wound center, especially when he has compression wraps. Today we will continue local care with silver alginate and West 4-layer Profore. I have asked him to see Dr. Dellia Nims next week. Electronic Signature(s) Signed: 08/12/2017 4:28:14 PM By: Christin Fudge MD, FACS Entered By: Christin Fudge on 08/12/2017 16:28:14 Corey West ([id number]) -------------------------------------------------------------------------------- ROS/PFSH Details Patient Name: Corey West, Corey West. Date of Service: 08/12/2017  3:30 PM Medical Record Number: [id number] Patient Account Number: [id number] Date of Birth/Sex: 07-24-1951 (66 y.o. Male) Treating RN: Ahmed Prima Primary Care Provider: [name] Other Clinician: Referring Provider: [name] Treating Provider/Extender: Frann Rider in Treatment: 15 Information Obtained From Patient Wound History Do you currently have one or more open woundso Yes  How many open wounds do you currently haveo 2 Approximately how long have you had your woundso 6 months How have you been treating your wound(s) until nowo nothing Has your wound(s) ever healed and then re-openedo No Have you had any lab work done in the past montho No Have you tested positive for an antibiotic resistant organism (MRSA, VRE)o No Have you tested positive for osteomyelitis (bone infection)o No Have you had any tests for circulation on your legso No Constitutional Symptoms (General Health) Medical History: Past Medical History Notes: high bood pressure, liver function decline, diabetic, Cirrhosis of liver Eyes Medical History: Positive for: Cataracts Negative for: Glaucoma Ear/Nose/Mouth/Throat Medical History: Negative for: Chronic sinus problems/congestion; Middle ear problems Hematologic/Lymphatic Medical History: Positive for: Anemia Negative for: Hemophilia; Human Immunodeficiency Virus; Lymphedema; Sickle Cell Disease Respiratory Medical History: Positive for: Sleep Apnea Negative for: Aspiration; Asthma; Chronic Obstructive Pulmonary Disease (COPD); Pneumothorax; Tuberculosis Cardiovascular Medical History: Positive for: Hypertension Negative for: Angina; Arrhythmia; Congestive Heart Failure; Coronary Artery Disease; Deep Vein Thrombosis; Hypotension; Myocardial Infarction; Peripheral Arterial Disease; Peripheral Venous Disease; Phlebitis; Vasculitis Corey West, Corey West ([id number]) Gastrointestinal Medical History: Positive for: Cirrhosis Negative for:  Colitis; Crohnos; Hepatitis West; Hepatitis B; Hepatitis C Endocrine Medical History: Positive for: Type II Diabetes Negative for: Type I Diabetes Time with diabetes: 10 years Treated with: Insulin Blood sugar tested every day: Yes Tested : Blood sugar testing results: Breakfast: 280 Genitourinary Medical History: Negative for: End Stage Renal Disease Past Medical History Notes: antibiotics for kidney infection Immunological Medical History: Negative for: Lupus Erythematosus; Raynaudos; Scleroderma Integumentary (Skin) Medical History: Negative for: History of Burn; History of pressure wounds Musculoskeletal Medical History: Negative for: Gout; Rheumatoid Arthritis; Osteoarthritis; Osteomyelitis Neurologic Medical History: Negative for: Dementia; Neuropathy; Quadriplegia; Paraplegia; Seizure Disorder Oncologic Medical History: Negative for: Received Chemotherapy; Received Radiation Psychiatric Medical History: Negative for: Anorexia/bulimia; Confinement Anxiety HBO Extended History Items Eyes: Cataracts Immunizations Corey West, Corey West ([id number]) Pneumococcal Vaccine: Received Pneumococcal Vaccination: Yes Implantable Devices Hospitalization / Surgery History Name of Hospital Purpose of Hospitalization/Surgery Date [facility] passed out and fell 10/28/2013 Family and Social History Cancer: No; Diabetes: Yes - Mother; Heart Disease: Yes - Father; Hypertension: Yes - Mother; Kidney Disease: No; Lung Disease: No; Seizures: Yes - Father; Stroke: No; Thyroid Problems: No; Light tobacco smoker; Alcohol Use: Never; Drug Use: No History; Caffeine Use: Moderate; Financial Concerns: No; Food, Clothing or Shelter Needs: No; Support System Lacking: No; Transportation Concerns: No; Advanced Directives: No; Patient does not want information on Advanced Directives; Do not resuscitate: No; Living Will: No; Medical Power of Attorney: No Physician Affirmation I have reviewed and agree with the  above information. Electronic Signature(s) Signed: 08/12/2017 4:30:10 PM By: Christin Fudge MD, FACS Signed: 08/12/2017 5:35:20 PM By: Alric Quan Entered By: Christin Fudge on 08/12/2017 16:26:23 Corey West ([id number]) -------------------------------------------------------------------------------- [place] Details Patient Name: Corey West, Corey West. Date of Service: 08/12/2017 Medical Record Number: [id number] Patient Account Number: [id number] Date of Birth/Sex: 02-24-1951 (66 y.o. 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 Physician Procedures CPT4: Description Modifier Quantity Code [id number] - WC PHYS LEVEL 3 - EST PT 1 ICD-10 Diagnosis Description I89.0 Lymphedema, not elsewhere classified L97.211 Non-pressure chronic ulcer of right calf limited to breakdown of skin I87.331 Chronic  venous hypertension (idiopathic) with ulcer and inflammation of right lower extremity E11.622 Type 2 diabetes mellitus with other skin ulcer Electronic Signature(s) Signed: 08/12/2017 5:23:32 PM By: Alric Quan Previous Signature: 08/12/2017 4:28:29 PM Version By: Christin Fudge MD, FACS Entered By: Alric Quan on 08/12/2017 17:23:32

## 2017-08-13 NOTE — Progress Notes (Signed)
HARSHAL, SIRMON ([id number]) Visit Report for 08/12/2017 Arrival Information Details Patient Name: Corey West, Corey West. Date of Service: 08/12/2017 3:30 PM Medical Record Number: [id number] Patient Account Number: [id number] Date of Birth/Sex: November 28, 1950 (66 y.o. Male) Treating RN: Ahmed Prima Primary Care Ciarra Braddy: [name] Other Clinician: Referring Tiyonna Sardinha: [name] Treating Tamitha Norell/Extender: Frann Rider in Treatment: 15 Visit Information History Since Last Visit All ordered tests and consults were completed: No Patient Arrived: Kasandra Knudsen Added or deleted any medications: No Arrival Time: 15:46 Any new allergies or adverse reactions: No Accompanied By: self Had a fall or experienced change in No Transfer Assistance: None activities of daily living that may affect Patient Identification Verified: Yes risk of falls: Secondary Verification Process Completed: Yes Signs or symptoms of abuse/neglect since last visito No Patient Requires Transmission-Based Precautions: No Hospitalized since last visit: No Patient Has Alerts: No Has Dressing in Place as Prescribed: Yes Has Compression in Place as Prescribed: Yes Pain Present Now: No Electronic Signature(s) Signed: 08/12/2017 5:35:20 PM By: Alric Quan Entered By: Alric Quan on 08/12/2017 15:48:47 Corey West ([id number]) -------------------------------------------------------------------------------- Encounter Discharge Information Details Patient Name: Corey Cower A. Date of Service: 08/12/2017 3:30 PM Medical Record Number: [id number] Patient Account Number: [id number] Date of Birth/Sex: [birth year] (66 y.o. Male) Treating RN: Ahmed Prima Primary Care Marcelia Petersen: [name] Other Clinician: Referring Brenin Heidelberger: [name] Treating Meshilem Machuca/Extender: Frann Rider in Treatment: 15 Encounter Discharge Information Items Discharge Pain Level: 0 Discharge Condition: Stable Ambulatory Status:  Cane Discharge Destination: Home Transportation: Private Auto Accompanied By: self Schedule Follow-up Appointment: Yes Medication Reconciliation completed and No provided to Patient/Care Tryone Kille: Provided on Clinical Summary of Care: 08/12/2017 Form Type Recipient Paper Patient [name] Signature(s) Signed: 08/12/2017 5:22:27 PM By: Alric Quan Entered By: Alric Quan on 08/12/2017 17:22:27 Corey West ([id number]) -------------------------------------------------------------------------------- Lower Extremity Assessment Details Patient Name: Corey Cower A. Date of Service: 08/12/2017 3:30 PM Medical Record Number: [id number] Patient Account Number: [id number] Date of Birth/Sex: 1951/07/19 (66 y.o. Male) Treating RN: Carolyne Fiscal, Debi Primary Care Mieko Kneebone: [name] Other Clinician: Referring Lyvia Mondesir: [name] Treating Dea Bitting/Extender: Frann Rider in Treatment: 15 Edema Assessment Assessed: [Left: No] Patrice Paradise: No] [Left: Edema] [Right: :] Calf Left: Right: Point of Measurement: 32 cm From Medial Instep 47.5 cm 49.8 cm Ankle Left: Right: Point of Measurement: 13 cm From Medial Instep 30.7 cm 30.5 cm Vascular Assessment Pulses: Dorsalis Pedis Palpable: [Left:Yes] [Right:Yes] Posterior Tibial Extremity colors, hair growth, and conditions: Extremity Color: [Left:Red] [Right:Red] Temperature of Extremity: [Left:Warm] [Right:Warm] Capillary Refill: [Left:< 3 seconds] [Right:< 3 seconds] Electronic Signature(s) Signed: 08/12/2017 5:35:20 PM By: Alric Quan Entered By: Alric Quan on 08/12/2017 16:05:55 Corey West ([id number]) -------------------------------------------------------------------------------- Multi Wound Chart Details Patient Name: Corey Cower A. Date of Service: 08/12/2017 3:30 PM Medical Record Number: [id number] Patient Account Number: [id number] Date of Birth/Sex: Feb 22, 1951 (66 y.o. Male) Treating RN:  Ahmed Prima Primary Care Carmella Kees: [name] Other Clinician: Referring Kadon Andrus: [name] Treating Caly Pellum/Extender: Frann Rider in Treatment: 15 Vital Signs Height(in): 15 Pulse(bpm): 59 Weight(lbs): 305.4 Blood Pressure(mmHg): 141/97 Body Mass Index(BMI): 45 Temperature(F): 98.3 Respiratory Rate 18 (breaths/min): Photos: [3:No Photos] [5:No Photos] [N/A:N/A] Wound Location: [3:Right Lower Leg - Circumfernential] [5:Left Lower Leg - Anterior] [N/A:N/A] Wounding Event: [3:Gradually Appeared] [5:Gradually Appeared] [N/A:N/A] Primary Etiology: [3:Lymphedema] [5:Lymphedema] [N/A:N/A] Secondary Etiology: [3:Diabetic Wound/Ulcer of the Lower Extremity] [5:N/A] [N/A:N/A] Comorbid History: [3:Cataracts, Anemia, Sleep Apnea, Hypertension, Cirrhosis , Type II Diabetes] [5:Cataracts, Anemia, Sleep Apnea, Hypertension, Cirrhosis , Type II Diabetes] [N/A:N/A] Date  Acquired: [3:01/14/2017] [5:07/02/2017] [N/A:N/A] Weeks of Treatment: [3:15] [5:5] [N/A:N/A] Wound Status: [3:Open] [5:Open] [N/A:N/A] Clustered Wound: [3:No] [5:Yes] [N/A:N/A] Clustered Quantity: [3:N/A] [5:2] [N/A:N/A] Measurements L x W x D [3:11x44.7x0.1] [5:11x7x0.1] [N/A:N/A] (cm) Area (cm) : [3:386.18] [5:60.476] [N/A:N/A] Volume (cm) : [3:38.618] [5:6.048] [N/A:N/A] % Reduction in Area: [3:-903.50%] [5:-795.40%] [N/A:N/A] % Reduction in Volume: [3:-0.30%] [5:-796.00%] [N/A:N/A] Classification: [3:Partial Thickness] [5:Partial Thickness] [N/A:N/A] Exudate Amount: [3:Large] [5:Large] [N/A:N/A] Exudate Type: [3:Serous] [5:Serous] [N/A:N/A] Exudate Color: [3:amber] [5:amber] [N/A:N/A] Foul Odor After Cleansing: [3:Yes] [5:No] [N/A:N/A] Odor Anticipated Due to [3:No] [5:N/A] [N/A:N/A] Product Use: Wound Margin: [3:Flat and Intact] [5:Distinct, outline attached] [N/A:N/A] Granulation Amount: [3:Medium (34-66%)] [5:Medium (34-66%)] [N/A:N/A] Granulation Quality: [3:Pink] [5:Pink]  [N/A:N/A] Necrotic Amount: [3:Medium (34-66%)] [5:Medium (34-66%)] [N/A:N/A] Exposed Structures: [3:Fascia: No Fat Layer (Subcutaneous Tissue) Exposed: No Tendon: No Muscle: No] [5:N/A] [N/A:N/A] Joint: No Bone: No Epithelialization: None None N/A Periwound Skin Texture: Excoriation: No No Abnormalities Noted N/A Induration: No Callus: No Crepitus: No Rash: No Scarring: No Periwound Skin Moisture: Maceration: Yes Maceration: Yes N/A Dry/Scaly: No Periwound Skin Color: Atrophie Blanche: No No Abnormalities Noted N/A Cyanosis: No Ecchymosis: No Erythema: No Hemosiderin Staining: No Mottled: No Pallor: No Rubor: No Temperature: No Abnormality No Abnormality N/A Tenderness on Palpation: Yes Yes N/A Wound Preparation: Ulcer Cleansing: Ulcer Cleansing: N/A Rinsed/Irrigated with Saline Rinsed/Irrigated with Saline Topical Anesthetic Applied: Topical Anesthetic Applied: None None Treatment Notes Electronic Signature(s) Signed: 08/12/2017 4:25:10 PM By: Christin Fudge MD, FACS Entered By: Christin Fudge on 08/12/2017 16:25:10 Corey West ([id number]) -------------------------------------------------------------------------------- [place] Details Patient Name: Corey West, Corey A. Date of Service: 08/12/2017 3:30 PM Medical Record Number: [id number] Patient Account Number: [id number] Date of Birth/Sex: Sep 15, 1951 (67 y.o. Male) Treating RN: Ahmed Prima Primary Care Edda Orea: [name] Other Clinician: Referring Phala Schraeder: [name] Treating Crystallee Werden/Extender: Frann Rider in Treatment: 15 Active Inactive ` Orientation to the Wound Care Program Nursing Diagnoses: Knowledge deficit related to the wound healing center program Goals: Patient/caregiver will verbalize understanding of the [facility] Program Date Initiated: 04/23/2017 Target Resolution Date: 05/04/2017 Goal Status: Active Interventions: Provide education on  orientation to the wound center Notes: ` Venous Leg Ulcer Nursing Diagnoses: Potential for venous Insuffiency (use before diagnosis confirmed) Goals: Patient will maintain optimal edema control Date Initiated: 04/23/2017 Target Resolution Date: 05/04/2017 Goal Status: Active Interventions: Compression as ordered Treatment Activities: Therapeutic compression applied : 04/23/2017 Notes: ` Wound/Skin Impairment Nursing Diagnoses: Impaired tissue integrity Goals: Ulcer/skin breakdown will have a volume reduction of 80% by week 12 Date Initiated: 04/23/2017 Target Resolution Date: 05/04/2017 Corey West, Corey West ([id number]) Goal Status: Active Interventions: Assess ulceration(s) every visit Notes: Electronic Signature(s) Signed: 08/12/2017 5:35:20 PM By: Alric Quan Entered By: Alric Quan on 08/12/2017 16:08:25 Corey West ([id number]) -------------------------------------------------------------------------------- Pain Assessment Details Patient Name: Corey Cower A. Date of Service: 08/12/2017 3:30 PM Medical Record Number: [id number] Patient Account Number: [id number] Date of Birth/Sex: [birth year] (66 y.o. Male) Treating RN: Ahmed Prima Primary Care Rohit Deloria: [name] Other Clinician: Referring Clarabelle Oscarson: [name] Treating Kenichi Cassada/Extender: Frann Rider in Treatment: 15 Active Problems Location of Pain Severity and Description of Pain Patient Has Paino No Site Locations Pain Management and Medication Current Pain Management: Electronic Signature(s) Signed: 08/12/2017 5:35:20 PM By: Alric Quan Entered By: Alric Quan on 08/12/2017 15:48:52 Corey West ([id number]) -------------------------------------------------------------------------------- Patient/Caregiver Education Details Patient Name: Corey Cower A. Date of Service: 08/12/2017 3:30 PM Medical Record Number: [id number] Patient Account Number: [id number] Date of  Birth/Gender: 07/28/1951 (66 y.o. Male) Treating  RN: Ahmed Prima Primary Care Physician: [name] Other Clinician: Referring Physician: [name] Treating Physician/Extender: Frann Rider in Treatment: 15 Education Assessment Education Provided To: Patient Education Topics Provided Wound/Skin Impairment: Handouts: Other: change dressing as ordered Methods: Demonstration, Explain/Verbal Responses: State content correctly Electronic Signature(s) Signed: 08/12/2017 5:35:20 PM By: Alric Quan Entered By: Alric Quan on 08/12/2017 17:23:07 Corey West ([id number]) -------------------------------------------------------------------------------- Wound Assessment Details Patient Name: Corey Cower A. Date of Service: 08/12/2017 3:30 PM Medical Record Number: [id number] Patient Account Number: [id number] Date of Birth/Sex: 11/07/1950 (66 y.o. Male) Treating RN: Ahmed Prima Primary Care Liddy Deam: [name] Other Clinician: Referring Jackson Coffield: [name] Treating Hue Frick/Extender: Frann Rider in Treatment: 15 Wound Status Wound Number: 3 Primary Lymphedema Etiology: Wound Location: Right Lower Leg - Circumfernential Secondary Diabetic Wound/Ulcer of the Lower Extremity Wounding Event: Gradually Appeared Etiology: Date Acquired: 01/14/2017 Wound Status: Open Weeks Of Treatment: 15 Comorbid Cataracts, Anemia, Sleep Apnea, Clustered Wound: No History: Hypertension, Cirrhosis , Type II Diabetes Wound Measurements Length: (cm) 11 Width: (cm) 44.7 Depth: (cm) 0.1 Area: (cm) 386.18 Volume: (cm) 38.618 % Reduction in Area: -903.5% % Reduction in Volume: -0.3% Epithelialization: None Tunneling: No Undermining: No Wound Description Classification: Partial Thickness Foul Od Wound Margin: Flat and Intact Due to Exudate Amount: Large Slough/ Exudate Type: Serous Exudate Color: amber or After Cleansing: Yes Product Use:  No Fibrino Yes Wound Bed Granulation Amount: Medium (34-66%) Exposed Structure Granulation Quality: Pink Fascia Exposed: No Necrotic Amount: Medium (34-66%) Fat Layer (Subcutaneous Tissue) Exposed: No Necrotic Quality: Adherent Slough Tendon Exposed: No Muscle Exposed: No Joint Exposed: No Bone Exposed: No Periwound Skin Texture Texture Color No Abnormalities Noted: No No Abnormalities Noted: No Callus: No Atrophie Blanche: No Crepitus: No Cyanosis: No Excoriation: No Ecchymosis: No Induration: No Erythema: No Rash: No Hemosiderin Staining: No Scarring: No Mottled: No Pallor: No Moisture Rubor: No No Abnormalities Noted: No Dry / Scaly: No Temperature / Pain Maceration: Yes Temperature: No Abnormality Corey West, CORKERY. ([id number]) Tenderness on Palpation: Yes Wound Preparation Ulcer Cleansing: Rinsed/Irrigated with Saline Topical Anesthetic Applied: None Treatment Notes Wound #3 (Right, Circumferential Lower Leg) 1. Cleansed with: Clean wound with Normal Saline Cleanse wound with antibacterial soap and water 2. Anesthetic Topical Lidocaine 4% cream to wound bed prior to debridement 3. Peri-wound Care: Barrier cream 4. Dressing Applied: Other dressing (specify in notes) 5. Secondary Dressing Applied ABD Pad 7. Secured with Tape 3 Layer Compression System - Bilateral Notes silvercel, xtrasorb, unna to Engineer, production) Signed: 08/12/2017 5:35:20 PM By: Alric Quan Entered By: Alric Quan on 08/12/2017 16:03:40 Corey West ([id number]) -------------------------------------------------------------------------------- Wound Assessment Details Patient Name: Corey West, Corey A. Date of Service: 08/12/2017 3:30 PM Medical Record Number: [id number] Patient Account Number: [id number] Date of Birth/Sex: [birth year] (66 y.o. Male) Treating RN: Ahmed Prima Primary Care Abram Sax: [name] Other Clinician: Referring Chrystal Zeimet: [name] Treating Najma Bozarth/Extender: Frann Rider in Treatment: 15 Wound Status Wound Number: 5 Primary Lymphedema Etiology: Wound Location: Left Lower Leg - Anterior Wound Open Wounding Event: Gradually Appeared Status: Date Acquired: 07/02/2017 Comorbid Cataracts, Anemia, Sleep Apnea, Weeks Of Treatment: 5 History: Hypertension, Cirrhosis , Type II Diabetes Clustered Wound: Yes Wound Measurements Length: (cm) 11 Width: (cm) 7 Depth: (cm) 0.1 Clustered Quantity: 2 Area: (cm) 60.476 Volume: (cm) 6.048 % Reduction in Area: -795.4% % Reduction in Volume: -796% Epithelialization: None Tunneling: No Undermining: No Wound Description Classification: Partial Thickness Wound Margin: Distinct, outline attached Exudate Amount: Large Exudate Type: Serous Exudate Color: amber  Foul Odor After Cleansing: No Slough/Fibrino Yes Wound Bed Granulation Amount: Medium (34-66%) Granulation Quality: Pink Necrotic Amount: Medium (34-66%) Necrotic Quality: Adherent Slough Periwound Skin Texture Texture Color No Abnormalities Noted: No No Abnormalities Noted: No Moisture Temperature / Pain No Abnormalities Noted: No Temperature: No Abnormality Maceration: Yes Tenderness on Palpation: Yes Wound Preparation Ulcer Cleansing: Rinsed/Irrigated with Saline Topical Anesthetic Applied: None Treatment Notes Wound #5 (Left, Anterior Lower Leg) 1. Cleansed with: Clean wound with Normal Saline Corey West, Corey A. ([id number]) Cleanse wound with antibacterial soap and water 2. Anesthetic Topical Lidocaine 4% cream to wound bed prior to debridement 3. Peri-wound Care: Barrier cream 4. Dressing Applied: Other dressing (specify in notes) 5. Secondary Dressing Applied ABD Pad 7. Secured with Tape 3 Layer Compression System - Bilateral Notes silvercel, xtrasorb, unna to Engineer, production) Signed: 08/12/2017 5:35:20 PM By: Alric Quan Entered By: Alric Quan on  08/12/2017 16:04:20 Corey West ([id number]) -------------------------------------------------------------------------------- [place] Details Patient Name: Corey Cower A. Date of Service: 08/12/2017 3:30 PM Medical Record Number: [id number] Patient Account Number: [id number] Date of Birth/Sex: 1951/06/20 (66 y.o. Male) Treating RN: Ahmed Prima Primary Care Claudia Alvizo: [name] Other Clinician: Referring Niya Behler: [name] Treating Sheretta Grumbine/Extender: Frann Rider in Treatment: 15 Vital Signs Time Taken: 15:48 Temperature (F): 98.3 Height (in): 69 Pulse (bpm): 79 Weight (lbs): 305.4 Respiratory Rate (breaths/min): 18 Body Mass Index (BMI): 45.1 Blood Pressure (mmHg): 141/97 Reference Range: 80 - 120 mg / dl Electronic Signature(s) Signed: 08/12/2017 5:35:20 PM By: Alric Quan Entered By: Alric Quan on 08/12/2017 15:49:37

## 2017-08-14 ENCOUNTER — Encounter
Admission: RE | Admit: 2017-08-14 | Discharge: 2017-08-14 | Disposition: A | Discharge status: Home / Self Care | Payer: Medicare Other | Source: Ambulatory Visit | Attending: *Deleted | Admitting: *Deleted

## 2017-08-14 DIAGNOSIS — D500 Iron deficiency anemia secondary to blood loss (chronic): Secondary | ICD-10-CM | POA: Diagnosis not present

## 2017-08-14 LAB — HEMOGLOBIN: Hemoglobin: 7.4 g/dL — ABNORMAL LOW (ref 13.0–18.0)

## 2017-08-15 LAB — PREPARE RBC (CROSSMATCH)

## 2017-08-16 ENCOUNTER — Ambulatory Visit
Admission: RE | Admit: 2017-08-16 | Discharge: 2017-08-16 | Disposition: A | Discharge status: Home / Self Care | Payer: Medicare Other | Source: Ambulatory Visit | Attending: Unknown Physician Specialty | Admitting: Unknown Physician Specialty

## 2017-08-16 DIAGNOSIS — D500 Iron deficiency anemia secondary to blood loss (chronic): Secondary | ICD-10-CM | POA: Diagnosis not present

## 2017-08-16 LAB — PREPARE RBC (CROSSMATCH)

## 2017-08-16 MED ORDER — SODIUM CHLORIDE 0.9 % IV SOLN
INTRAVENOUS | Status: DC
Start: 2017-08-16 — End: 2017-08-17
  Administered 2017-08-16: 09:00:00 via INTRAVENOUS

## 2017-08-17 LAB — TYPE AND SCREEN
ABO/RH(D): A NEG
Antibody Screen: NEGATIVE
Extend sample reason: TRANSFUSED
UNIT DIVISION: 0
Unit division: 0

## 2017-08-17 LAB — BPAM RBC
BLOOD PRODUCT EXPIRATION DATE: 201811262359
Blood Product Expiration Date: 201811252359
ISSUE DATE / TIME: 201811160813
ISSUE DATE / TIME: 201811161041
UNIT TYPE AND RH: 600
Unit Type and Rh: 600

## 2017-08-19 ENCOUNTER — Ambulatory Visit: Payer: Medicare Other | Admitting: Surgery

## 2017-08-20 ENCOUNTER — Encounter: Payer: Medicare Other | Admitting: Internal Medicine

## 2017-08-20 DIAGNOSIS — E11622 Type 2 diabetes mellitus with other skin ulcer: Secondary | ICD-10-CM | POA: Diagnosis not present

## 2017-08-22 NOTE — Progress Notes (Addendum)
NEILAN, RIZZO ([id number]) Visit Report for 08/20/2017 HPI Details Patient Name: Corey West, Corey West. Date of Service: 08/20/2017 1:30 PM Medical Record Number: [id number] Patient Account Number: [id number] Date of Birth/Sex: 1950/11/23 (66 y.o. Male) Treating RN: Ahmed Prima Primary Care Provider: [name] Other Clinician: Referring Provider: [name] Treating Provider/Extender: Tito Dine in Treatment: 16 History of Present Illness HPI Description: Pleasant 66 year old with a past medical history significant for morbid obesity, insulin-dependent type 2 diabetes (hemoglobin A1c 7.2 in January 2015), cirrhosis, and chronic kidney disease. He reports a left anterior calf ulcer since January 2015. He has had venous stasis ulcers in the past, which have healed spontaneously. No history of DVT. Does not regularly wear compression. Ambulating normally per his baseline. Recently completed a course of antibiotics. Treated with a Profore light and Coban 2 layer compression drainage, which both fell down and he cut off. Tolerated Coban lite compression bandage. Returns to clinic for followup. No complaints today. No pain at this time. No fever or chills. No open wounds at this time. He is having difficulty finding compression garments that fit him. 04/23/17; this is now 66 year old man who returns to our clinic for nonhealing wounds on the right lateral and right posterior calf. He tells me that they have been present for 6 months. In the setting of severe right greater than left lower extremity lymphedema likely secondary to chronic venous insufficiency. He has been followed in the lymphedema clinic at least since early June with compression wraps although these have not really helped and the patient is here for our review of this situation. The patient has had problems with edema in his legs for years. He has not worn compression. He is type II diabetic that sounds  poorly controlled. He also has obstructive sleep apnea and cirrhosis. He is occasional cigar smoker 04/30/17; patient is a 66 year old man who has severe bilateral lower extremity lymphedema stage III since his early 37s he tells me. This is no doubt secondary to chronic venous insufficiency. He was going to the lymphedema clinic who forwarded him here due to skin breakdown in the right leg. He has not previously worn compression stockings. He has a large area of denuded epithelium on the posterior leg. He has a more open wound on the right lateral leg 05/07/17 on evaluation today patient appears to be doing about the same his wounds are showing signs of improvement and looking fairly well. With that being said he is not having any severe discomfort just some mild aching due to the swelling and really this is worse on the left than the right and the right side is where he actually has his wounds. He has been tolerating the [employer] wraps without complication and states this is actually much better than the wraps that he was getting at the lymphedema clinic most recently. He is happy with these wraps. He has no nausea, vomiting, diarrhea and no fevers or chills. We are still waiting to hear back in regard to the lymphedema pumps which I do think would be beneficial for him even with the compression wraps he still has a significant amount of edema noted of the bilateral lower extremities. 05/15/17 on evaluation today patient's right lower extremity edema and weeping especially the posterior portion appears to be doing better. He still is having some issue here but fortunately it's minimal compared to previous weeks. He continues to tolerate the compression fairly well. Overall he is pleased with how things are progressing.  No fevers, chills, nausea, or vomiting noted at this time. And patient is having only minimal discomfort. 05/22/17; the medial wound on the right leg actually looks considerably better  and is epithelialized. He still has a small punched out area on the right lateral leg. Edema control is marginal end of the [employer]. We are working on getting him compression pumps 05/29/17; the patient's lymphedema pumps are apparently arriving tonight. In the meantime he took his Unna boots off last Sunday because the edema had progressed to the point that a word to titrate. Since then he has put nothing on his right leg and more specifically nothing on the right medial and lateral wounds 06/05/17; the patient has his lymphedema pumps and he is using them once a day on the right leg. There is a sizable reduction in the circumference of his leg although he arrived in clinic today somewhat short of breath. 07/02/17; since the patient was last in clinic he was admitted on 2 occasions. He was admitted from 9/12 through 9/13 with TEAGON, KRON A. ([id number]) lactic acidosis weakness and felt to have left lower extremity cellulitis. Due to the elevated lactic acid level he was felt to have sepsis although this was ruled out and the lactic acidosis was felt secondary to his chronic cirrhosis. He was initially treated with IV vancomycin for cellulitis and redness of his left leg but then this was subsequently recognized to be secondary to chronic venous stasis and chronic lymphedema. When he left the hospital he had Unna boots placed bilaterally. He was readmitted on 9/26 through 9/28 with GI bleeding. His EGD showed Barrett's esophagus and gastritis. He was discharged with a hemoglobin of 8 to follow-up with GI as an outpatient. There was nothing more about his lower extremities during this short hospital stay. The patient has not been using his external compression pumps fearing fluid overload and shortness of breath although he feels better now that his hemoglobin is stabilized. He continues to have epithelial loss on his bilateral lower extremities. 07/09/17; patient has a large but superficial area on  the right posterior leg and on the left anterior leg. He tells me he is been using his external compression pumps once a day for the required hour. He states the pumps hurt his right ankle. 07/17/17; patient was transfused a further 2 units since he was here the last time. His hemoglobin was once again found to be low. He has not been able to use his external compression pumps because of a combination of prolonged power outage in his home and I think issues with his other medical care. His wounds have actually worsened he now has loss of epithelialization all the way from the right lateral to the right medial posteriorly. The left side which has anterior loss of epithelialization also appears to be worse on the lateral aspect of the leg where there is denuded epithelium now as well. Leaking edema fluid. He has bilateral lymphedema. 08/12/2017 -- the patient has not been here for 25 days and has had the same compression wraps which have not been changed!! He says he was being worked up for a GI bleed and had some transfusions since he was seen last by Dr. Dellia Nims 08/20/17; mostly by review of what the patient is saying and a quick glance at his records in care everywhere I think this patient probably has Karlene Lineman, cirrhosis and portal hypertension. He is bleeding I think from gastric area and he is requiring recurrent transfusions. He  has been told that there is nothing more that can be done and that his prognosis is poor. He arrived in our office in our office in tears. We've been using silver alginate and 3 layer compression to his bilateral lower extremity wounds. He has significant lymphedema likely secondary to chronic venous insufficiency. He has external compression pumps at home that he had actually before he came to this clinic but he is not using Electronic Signature(s) Signed: 08/21/2017 8:07:00 AM By: Linton Ham MD Entered By: Linton Ham on 08/20/2017 14:05:02 [name]  ([id number]) -------------------------------------------------------------------------------- Physical Exam Details Patient Name: NESANEL, AGUILA A. Date of Service: 08/20/2017 1:30 PM Medical Record Number: [id number] Patient Account Number: [id number] Date of Birth/Sex: 21-Oct-1950 (66 y.o. Male) Treating RN: Ahmed Prima Primary Care Provider: [name] Other Clinician: Referring Provider: [name] Treating Provider/Extender: Tito Dine in Treatment: 77 Constitutional Patient is hypertensive.. Pulse regular and within target range for patient.Marland Kitchen Respirations regular, non-labored and within target range.. Temperature is normal and within the target range for the patient.Marland Kitchen appears in no distress. Eyes Conjunctivae clear. No discharge. Cardiovascular Femoral arteries without bruits and pulses strong.. Pedal pulses palpable and strong bilaterally.. Lymphatic None palpable in the popliteal or inguinal area. Psychiatric Patient appears depressed today.. Notes #1 the larger of the 2 ulcerations is in the right posterior lower extremity. Although is clean and the edema in the right leg is better controlled. #2 on the lateral aspect of the left leg there is also a fairly extensive wound although this looks better. #3 his lymphedema is controlled Electronic Signature(s) Signed: 08/21/2017 8:07:00 AM By: Linton Ham MD Entered By: Linton Ham on 08/20/2017 14:06:53 [name] ([id number]) -------------------------------------------------------------------------------- Physician Orders Details Patient Name: MARSHAUN, LORTIE A. Date of Service: 08/20/2017 1:30 PM Medical Record Number: [id number] Patient Account Number: [id number] Date of Birth/Sex: Nov 22, 1950 (66 y.o. Male) Treating RN: Carolyne Fiscal, Debi Primary Care Provider: [name] Other Clinician: Referring Provider: [name] Treating Provider/Extender: Tito Dine in Treatment:  108 Verbal / Phone Orders: Yes Clinician: Carolyne Fiscal, Debi Read Back and Verified: Yes Diagnosis Coding ICD-10 Coding Code Description I89.0 Lymphedema, not elsewhere classified L97.211 Non-pressure chronic ulcer of right calf limited to breakdown of skin I87.331 Chronic venous hypertension (idiopathic) with ulcer and inflammation of right lower extremity E11.622 Type 2 diabetes mellitus with other skin ulcer I89.9 Noninfective disorder of lymphatic vessels and lymph nodes, unspecified Wound Cleansing Wound #3 Right,Circumferential Lower Leg o Clean wound with Normal Saline. o Cleanse wound with mild soap and water Wound #5 Left,Anterior Lower Leg o Clean wound with Normal Saline. o Cleanse wound with mild soap and water Anesthetic Wound #3 Right,Circumferential Lower Leg o Topical Lidocaine 4% cream applied to wound bed prior to debridement Wound #5 Left,Anterior Lower Leg o Topical Lidocaine 4% cream applied to wound bed prior to debridement Skin Barriers/Peri-Wound Care Wound #3 Right,Circumferential Lower Leg o Triamcinolone Acetonide Ointment Wound #5 Left,Anterior Lower Leg o Triamcinolone Acetonide Ointment Primary Wound Dressing Wound #3 Right,Circumferential Lower Leg o Silvercel Non-Adherent Wound #5 Left,Anterior Lower Leg o Silvercel Non-Adherent Secondary Dressing Wound #3 Right,Circumferential Lower Leg o ABD pad o GLADSTONE, ROSAS ([id number]) Wound #5 Left,Anterior Lower Leg o ABD pad o XtraSorb Dressing Change Frequency Wound #3 Right,Circumferential Lower Leg o Change dressing every week Wound #5 Left,Anterior Lower Leg o Change dressing every week Follow-up Appointments Wound #3 Right,Circumferential Lower Leg o Return Appointment in 1 week. o Nurse Visit as needed Wound #5 Left,Anterior  Lower Leg o Return Appointment in 1 week. o Nurse Visit as needed Edema Control Wound #3 Right,Circumferential  Lower Leg o 3 Layer Compression System - Right Lower Extremity - unna to anchor o Compression Pump: Use compression pump on left lower extremity for 30 minutes, twice daily. Wound #5 Left,Anterior Lower Leg o 3 Layer Compression System - Right Lower Extremity - unna to anchor o Compression Pump: Use compression pump on left lower extremity for 30 minutes, twice daily. Additional Orders / Instructions Wound #3 Right,Circumferential Lower Leg o Increase protein intake. o Other: - Get Blood Sugars below 150 Wound #5 Left,Anterior Lower Leg o Increase protein intake. o Other: - Get Blood Sugars below 150 Electronic Signature(s) Signed: 08/20/2017 4:11:07 PM By: Alric Quan Signed: 08/21/2017 8:07:00 AM By: Linton Ham MD Entered By: Alric Quan on 08/20/2017 14:16:46 [name] ([id number]) -------------------------------------------------------------------------------- Problem List Details Patient Name: DARYEL, KENNETH A. Date of Service: 08/20/2017 1:30 PM Medical Record Number: [id number] Patient Account Number: [id number] Date of Birth/Sex: 1950/12/29 (66 y.o. Male) Treating RN: Ahmed Prima Primary Care Provider: [name] Other Clinician: Referring Provider: [name] Treating Provider/Extender: Tito Dine in Treatment: 17 Active Problems ICD-10 Encounter Code Description Active Date Diagnosis I89.0 Lymphedema, not elsewhere classified 04/23/2017 Yes L97.211 Non-pressure chronic ulcer of right calf limited to breakdown of skin 04/23/2017 Yes I87.331 Chronic venous hypertension (idiopathic) with ulcer and 04/23/2017 Yes inflammation of right lower extremity E11.622 Type 2 diabetes mellitus with other skin ulcer 04/23/2017 Yes I89.9 Noninfective disorder of lymphatic vessels and lymph nodes, 04/23/2017 Yes unspecified Inactive Problems Resolved Problems Electronic Signature(s) Signed: 08/21/2017 8:07:00 AM By: Linton Ham  MD Entered By: Linton Ham on 08/20/2017 14:02:46 [name] ([id number]) -------------------------------------------------------------------------------- Progress Note Details Patient Name: [name] Date of Service: 08/20/2017 1:30 PM Medical Record Number: [id number] Patient Account Number: [id number] Date of Birth/Sex: July 12, 1951 (66 y.o. Male) Treating RN: Ahmed Prima Primary Care Provider: [name] Other Clinician: Referring Provider: [name] Treating Provider/Extender: Tito Dine in Treatment: 17 Subjective History of Present Illness (HPI) Pleasant 66 year old with a past medical history significant for morbid obesity, insulin-dependent type 2 diabetes (hemoglobin A1c 7.2 in January 2015), cirrhosis, and chronic kidney disease. He reports a left anterior calf ulcer since January 2015. He has had venous stasis ulcers in the past, which have healed spontaneously. No history of DVT. Does not regularly wear compression. Ambulating normally per his baseline. Recently completed a course of antibiotics. Treated with a Profore light and Coban 2 layer compression drainage, which both fell down and he cut off. Tolerated Coban lite compression bandage. Returns to clinic for followup. No complaints today. No pain at this time. No fever or chills. No open wounds at this time. He is having difficulty finding compression garments that fit him. 04/23/17; this is now 66 year old man who returns to our clinic for nonhealing wounds on the right lateral and right posterior calf. He tells me that they have been present for 6 months. In the setting of severe right greater than left lower extremity lymphedema likely secondary to chronic venous insufficiency. He has been followed in the lymphedema clinic at least since early June with compression wraps although these have not really helped and the patient is here for our review of this situation. The patient has had  problems with edema in his legs for years. He has not worn compression. He is type II diabetic that sounds poorly controlled. He also has obstructive sleep apnea and  cirrhosis. He is occasional cigar smoker 04/30/17; patient is a 66 year old man who has severe bilateral lower extremity lymphedema stage III since his early 56s he tells me. This is no doubt secondary to chronic venous insufficiency. He was going to the lymphedema clinic who forwarded him here due to skin breakdown in the right leg. He has not previously worn compression stockings. He has a large area of denuded epithelium on the posterior leg. He has a more open wound on the right lateral leg 05/07/17 on evaluation today patient appears to be doing about the same his wounds are showing signs of improvement and looking fairly well. With that being said he is not having any severe discomfort just some mild aching due to the swelling and really this is worse on the left than the right and the right side is where he actually has his wounds. He has been tolerating the [employer] wraps without complication and states this is actually much better than the wraps that he was getting at the lymphedema clinic most recently. He is happy with these wraps. He has no nausea, vomiting, diarrhea and no fevers or chills. We are still waiting to hear back in regard to the lymphedema pumps which I do think would be beneficial for him even with the compression wraps he still has a significant amount of edema noted of the bilateral lower extremities. 05/15/17 on evaluation today patient's right lower extremity edema and weeping especially the posterior portion appears to be doing better. He still is having some issue here but fortunately it's minimal compared to previous weeks. He continues to tolerate the compression fairly well. Overall he is pleased with how things are progressing. No fevers, chills, nausea, or vomiting noted at this time. And patient is  having only minimal discomfort. 05/22/17; the medial wound on the right leg actually looks considerably better and is epithelialized. He still has a small punched out area on the right lateral leg. Edema control is marginal end of the [employer]. We are working on getting him compression pumps 05/29/17; the patient's lymphedema pumps are apparently arriving tonight. In the meantime he took his Unna boots off last Sunday because the edema had progressed to the point that a word to titrate. Since then he has put nothing on his right leg and more specifically nothing on the right medial and lateral wounds 06/05/17; the patient has his lymphedema pumps and he is using them once a day on the right leg. There is a sizable reduction in the circumference of his leg although he arrived in clinic today somewhat short of breath. 07/02/17; since the patient was last in clinic he was admitted on 2 occasions. He was admitted from 9/12 through 9/13 with lactic acidosis weakness and felt to have left lower extremity cellulitis. Due to the elevated lactic acid level he was felt to have sepsis although this was ruled out and the lactic acidosis was felt secondary to his chronic cirrhosis. He was initially treated DELMORE, SEAR ([id number]) with IV vancomycin for cellulitis and redness of his left leg but then this was subsequently recognized to be secondary to chronic venous stasis and chronic lymphedema. When he left the hospital he had Unna boots placed bilaterally. He was readmitted on 9/26 through 9/28 with GI bleeding. His EGD showed Barrett's esophagus and gastritis. He was discharged with a hemoglobin of 8 to follow-up with GI as an outpatient. There was nothing more about his lower extremities during this short hospital stay.  The patient has not been using his external compression pumps fearing fluid overload and shortness of breath although he feels better now that his hemoglobin is stabilized. He continues to  have epithelial loss on his bilateral lower extremities. 07/09/17; patient has a large but superficial area on the right posterior leg and on the left anterior leg. He tells me he is been using his external compression pumps once a day for the required hour. He states the pumps hurt his right ankle. 07/17/17; patient was transfused a further 2 units since he was here the last time. His hemoglobin was once again found to be low. He has not been able to use his external compression pumps because of a combination of prolonged power outage in his home and I think issues with his other medical care. His wounds have actually worsened he now has loss of epithelialization all the way from the right lateral to the right medial posteriorly. The left side which has anterior loss of epithelialization also appears to be worse on the lateral aspect of the leg where there is denuded epithelium now as well. Leaking edema fluid. He has bilateral lymphedema. 08/12/2017 -- the patient has not been here for 25 days and has had the same compression wraps which have not been changed!! He says he was being worked up for a GI bleed and had some transfusions since he was seen last by Dr. Dellia Nims 08/20/17; mostly by review of what the patient is saying and a quick glance at his records in care everywhere I think this patient probably has Karlene Lineman, cirrhosis and portal hypertension. He is bleeding I think from gastric area and he is requiring recurrent transfusions. He has been told that there is nothing more that can be done and that his prognosis is poor. He arrived in our office in our office in tears. We've been using silver alginate and 3 layer compression to his bilateral lower extremity wounds. He has significant lymphedema likely secondary to chronic venous insufficiency. He has external compression pumps at home that he had actually before he came to this clinic but he is not using Objective Constitutional Patient is  hypertensive.. Pulse regular and within target range for patient.Marland Kitchen Respirations regular, non-labored and within target range.. Temperature is normal and within the target range for the patient.Marland Kitchen appears in no distress. Vitals Time Taken: 1:32 PM, Height: 69 in, Weight: 305.4 lbs, BMI: 45.1, Temperature: 98.1 F, Pulse: 78 bpm, Respiratory Rate: 22 breaths/min, Blood Pressure: 176/54 mmHg. Eyes Conjunctivae clear. No discharge. Cardiovascular Femoral arteries without bruits and pulses strong.. Pedal pulses palpable and strong bilaterally.. Lymphatic None palpable in the popliteal or inguinal area. Psychiatric Patient appears depressed today.. General Notes: #1 the larger of the 2 ulcerations is in the right posterior lower extremity. Although is clean and the edema in the right leg is better controlled. #2 on the lateral aspect of the left leg there is also a fairly extensive wound although this CID, AGENA ([id number]) looks better. #3 his lymphedema is controlled Integumentary (Hair, Skin) Wound #3 status is Open. Original cause of wound was Gradually Appeared. The wound is located on the Right,Circumferential Lower Leg. The wound measures 11cm length x 45.6cm width x 0.1cm depth; 393.956cm^2 area and 39.396cm^3 volume. There is no tunneling or undermining noted. There is a large amount of serous drainage noted. Foul odor after cleansing was noted. The wound margin is flat and intact. There is medium (34-66%) pink granulation within the wound bed. There is  a medium (34-66%) amount of necrotic tissue within the wound bed including Adherent Slough. The periwound skin appearance exhibited: Maceration. The periwound skin appearance did not exhibit: Callus, Crepitus, Excoriation, Induration, Rash, Scarring, Dry/Scaly, Atrophie Blanche, Cyanosis, Ecchymosis, Hemosiderin Staining, Mottled, Pallor, Rubor, Erythema. Periwound temperature was noted as No Abnormality. The periwound has tenderness on  palpation. Wound #5 status is Open. Original cause of wound was Gradually Appeared. The wound is located on the Left,Anterior Lower Leg. The wound measures 9cm length x 5cm width x 0.1cm depth; 35.343cm^2 area and 3.534cm^3 volume. There is no tunneling or undermining noted. There is a large amount of serous drainage noted. The wound margin is distinct with the outline attached to the wound base. There is medium (34-66%) pink granulation within the wound bed. There is a medium (34-66%) amount of necrotic tissue within the wound bed including Adherent Slough. The periwound skin appearance exhibited: Maceration. Periwound temperature was noted as No Abnormality. The periwound has tenderness on palpation. 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 Cleanse wound with mild soap and water Wound #5 Left,Anterior Lower Leg: Clean wound with Normal Saline. Cleanse wound with mild soap and water Anesthetic: Wound #3 Right,Circumferential Lower Leg: Topical Lidocaine 4% cream applied to wound bed prior to debridement Wound #5 Left,Anterior Lower Leg: Topical Lidocaine 4% cream applied to wound bed prior to debridement Skin Barriers/Peri-Wound Care: Wound #3 Right,Circumferential Lower Leg: Triamcinolone Acetonide Ointment Wound #5 Left,Anterior Lower Leg: Triamcinolone Acetonide Ointment Primary Wound Dressing: VANESSA, ALESI ([id number]) Wound #3 Right,Circumferential Lower Leg: Silvercel Non-Adherent Wound #5 Left,Anterior Lower Leg: Silvercel  Non-Adherent Secondary Dressing: Wound #3 Right,Circumferential Lower Leg: ABD pad XtraSorb Wound #5 Left,Anterior Lower Leg: ABD pad XtraSorb Dressing Change Frequency: Wound #3 Right,Circumferential Lower Leg: Change dressing every week Wound #5 Left,Anterior Lower Leg: Change dressing every week Follow-up Appointments: Wound #3 Right,Circumferential Lower Leg: Return Appointment in 1 week. Nurse Visit as needed Wound #5 Left,Anterior Lower Leg: Return Appointment in 1 week. Nurse Visit as needed Edema Control: Wound #3 Right,Circumferential Lower Leg: 3 Layer Compression System - Right Lower Extremity - unna to anchor Compression Pump: Use compression pump on left lower extremity for 30 minutes, twice daily. Wound #5 Left,Anterior Lower Leg: 3 Layer Compression System - Right Lower Extremity - unna to anchor Compression Pump: Use compression pump on left lower extremity for 30 minutes, twice daily. Additional Orders / Instructions: Wound #3 Right,Circumferential Lower Leg: Increase protein intake. Other: - Get Blood Sugars below 150 Wound #5 Left,Anterior Lower Leg: Increase protein intake. Other: - Get Blood Sugars below 150 o o The area on the left lateral leg looks considerably better. Right still is a fairly extensive wound. 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 He was transfused recently. Follow-up hemoglobin was 8.4 yesterday from 7.2 I've asked him to consider using the pump on the left leg which I think is close to healing CHRISTOPHERJAME, CARNELL ([id number]) Still the silver alginate 3D  resting's bilaterally Electronic Signature(s) Signed: 08/27/2017 12:55:15 PM By: Gretta Cool, BSN, RN, CWS, Kim RN, BSN Signed: 08/28/2017 5:42:40 PM By: Linton Ham MD Previous Signature: 08/21/2017 8:07:00 AM Version By: Linton Ham MD Entered By: Gretta Cool, BSN, RN, CWS, Kim on 08/27/2017 12:55:15 MYLIK, PRO ([id number]) -------------------------------------------------------------------------------- [place] Details Patient Name: JOANTHONY, HAMZA. Date of Service: 08/20/2017 Medical Record Number: [id number] Patient Account Number: [id number] Date of Birth/Sex: 10-03-1950 (66 y.o. 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 Physician Procedures CPT4 Code: 6546503 Description: 54656 - WC PHYS LEVEL 3 - EST PT ICD-10 Diagnosis Description C12.751 Non-pressure chronic ulcer of right calf limited to breakdo I89.0 Lymphedema, not elsewhere classified Modifier: wn of skin Quantity: 1 Electronic Signature(s) Signed: 08/20/2017 4:11:07 PM By: Alric Quan Signed: 08/21/2017 8:07:00 AM By: Linton Ham MD Entered By: Alric Quan on 08/20/2017 14:17:03

## 2017-08-22 NOTE — Progress Notes (Signed)
KODA, ROUTON ([id number]) Visit Report for 08/20/2017 Arrival Information Details Patient Name: Corey West, Corey West. Date of Service: 08/20/2017 1:30 PM Medical Record Number: [id number] Patient Account Number: [id number] Date of Birth/Sex: 1951-08-22 (66 y.o. Male) Treating RN: Ahmed Prima Primary Care Kaleisha Bhargava: [name] Other Clinician: Referring Virginia Francisco: [name] Treating Deatra Mcmahen/Extender: Frann Rider in Treatment: 40 Visit Information History Since Last Visit All ordered tests and consults were completed: No Patient Arrived: Corey West Added or deleted any medications: No Arrival Time: 13:25 Any new allergies or adverse reactions: No Accompanied By: self Had a fall or experienced change in No Transfer Assistance: EasyPivot Patient activities of daily living that may affect Lift risk of falls: Patient Identification Verified: Yes Signs or symptoms of abuse/neglect since last visito No Secondary Verification Process Yes Hospitalized since last visit: No Completed: Has Dressing in Place as Prescribed: Yes Patient Requires Transmission-Based No Precautions: Has Compression in Place as Prescribed: Yes Patient Has Alerts: No Pain Present Now: No Electronic Signature(s) Signed: 08/20/2017 4:11:07 PM By: Alric Quan Entered By: Alric Quan on 08/20/2017 13:26:13 [name] ([id number]) -------------------------------------------------------------------------------- Encounter Discharge Information Details Patient Name: Corey West A. Date of Service: 08/20/2017 1:30 PM Medical Record Number: [id number] Patient Account Number: [id number] Date of Birth/Sex: [birth year] (66 y.o. Male) Treating RN: Ahmed Prima Primary Care Aadit Hagood: [name] Other Clinician: Referring Louie Flenner: [name] Treating [employer] Teegarden/Extender: Tito Dine in Treatment: 17 Encounter Discharge Information Items Discharge Pain Level: 0 Discharge Condition:  Stable Ambulatory Status: Cane Discharge Destination: Home Transportation: Private Auto Accompanied By: self Schedule Follow-up Appointment: Yes Medication Reconciliation completed and No provided to Patient/Care Taquita Demby: Provided on Clinical Summary of Care: 08/20/2017 Form Type Recipient Paper Patient [name] Signature(s) Signed: 08/20/2017 4:11:07 PM By: Alric Quan Entered By: Alric Quan on 08/20/2017 14:16:03 [name] ([id number]) -------------------------------------------------------------------------------- Lower Extremity Assessment Details Patient Name: Corey West A. Date of Service: 08/20/2017 1:30 PM Medical Record Number: [id number] Patient Account Number: [id number] Date of Birth/Sex: Sep 22, 1951 (66 y.o. Male) Treating RN: Carolyne Fiscal, Debi Primary Care Tharun Cappella: [name] Other Clinician: Referring Corey West: [name] Treating Icelynn Onken/Extender: Frann Rider in Treatment: 17 Edema Assessment Assessed: [Left: No] Patrice Paradise: No] [Left: Edema] [Right: :] Calf Left: Right: Point of Measurement: 32 cm From Medial Instep 45.5 cm 50.6 cm Ankle Left: Right: Point of Measurement: 13 cm From Medial Instep 28.5 cm 30 cm Vascular Assessment Pulses: Dorsalis Pedis Palpable: [Left:Yes] [Right:Yes] Posterior Tibial Extremity colors, hair growth, and conditions: Extremity Color: [Left:Red] [Right:Red] Hair Growth on Extremity: [Left:No] [Right:No] Temperature of Extremity: [Left:Warm] [Right:Warm] Capillary Refill: [Left:< 3 seconds] [Right:< 3 seconds] Toe Nail Assessment Left: Right: Thick: No No Discolored: Yes Yes Deformed: No No Improper Length and Hygiene: Yes Yes Electronic Signature(s) Signed: 08/20/2017 4:11:07 PM By: Alric Quan Entered By: Alric Quan on 08/20/2017 13:36:44 [name] ([id number]) -------------------------------------------------------------------------------- Multi Wound Chart  Details Patient Name: Corey West A. Date of Service: 08/20/2017 1:30 PM Medical Record Number: [id number] Patient Account Number: [id number] Date of Birth/Sex: 12-29-1950 (66 y.o. Male) Treating RN: Ahmed Prima Primary Care Dickson Kostelnik: [name] Other Clinician: Referring Onnika Siebel: [name] Treating Wallace Gappa/Extender: Frann Rider in Treatment: 17 Vital Signs Height(in): 24 Pulse(bpm): 44 Weight(lbs): 305.4 Blood Pressure(mmHg): 176/54 Body Mass Index(BMI): 45 Temperature(F): 98.1 Respiratory Rate 22 (breaths/min): Photos: [3:No Photos] [5:No Photos] [N/A:N/A] Wound Location: [3:Right Lower Leg - Circumfernential] [5:Left Lower Leg - Anterior] [N/A:N/A] Wounding Event: [3:Gradually Appeared] [5:Gradually Appeared] [N/A:N/A] Primary Etiology: [3:Lymphedema] [5:Lymphedema] [N/A:N/A] Secondary Etiology: [3:Diabetic Wound/Ulcer of  the Lower Extremity] [5:N/A] [N/A:N/A] Comorbid History: [3:Cataracts, Anemia, Sleep Apnea, Hypertension, Cirrhosis , Type II Diabetes] [5:Cataracts, Anemia, Sleep Apnea, Hypertension, Cirrhosis , Type II Diabetes] [N/A:N/A] Date Acquired: [3:01/14/2017] [5:07/02/2017] [N/A:N/A] Weeks of Treatment: [3:17] [5:7] [N/A:N/A] Wound Status: [3:Open] [5:Open] [N/A:N/A] Clustered Wound: [3:No] [5:Yes] [N/A:N/A] Clustered Quantity: [3:N/A] [5:2] [N/A:N/A] Measurements L x W x D [3:11x45.6x0.1] [5:9x5x0.1] [N/A:N/A] (cm) Area (cm) : [3:393.956] [5:35.343] [N/A:N/A] Volume (cm) : [3:39.396] [5:3.534] [N/A:N/A] % Reduction in Area: [3:-923.70%] [5:-423.30%] [N/A:N/A] % Reduction in Volume: [3:-2.40%] [5:-423.60%] [N/A:N/A] Classification: [3:Partial Thickness] [5:Partial Thickness] [N/A:N/A] Exudate Amount: [3:Large] [5:Large] [N/A:N/A] Exudate Type: [3:Serous] [5:Serous] [N/A:N/A] Exudate Color: [3:amber] [5:amber] [N/A:N/A] Foul Odor After Cleansing: [3:Yes] [5:No] [N/A:N/A] Odor Anticipated Due to [3:No] [5:N/A] [N/A:N/A] Product  Use: Wound Margin: [3:Flat and Intact] [5:Distinct, outline attached] [N/A:N/A] Granulation Amount: [3:Medium (34-66%)] [5:Medium (34-66%)] [N/A:N/A] Granulation Quality: [3:Pink] [5:Pink] [N/A:N/A] Necrotic Amount: [3:Medium (34-66%)] [5:Medium (34-66%)] [N/A:N/A] Exposed Structures: [3:Fascia: No Fat Layer (Subcutaneous Tissue) Exposed: No Tendon: No Muscle: No] [5:N/A] [N/A:N/A] Joint: No Bone: No Epithelialization: None None N/A Periwound Skin Texture: Excoriation: No No Abnormalities Noted N/A Induration: No Callus: No Crepitus: No Rash: No Scarring: No Periwound Skin Moisture: Maceration: Yes Maceration: Yes N/A Dry/Scaly: No Periwound Skin Color: Atrophie Blanche: No No Abnormalities Noted N/A Cyanosis: No Ecchymosis: No Erythema: No Hemosiderin Staining: No Mottled: No Pallor: No Rubor: No Temperature: No Abnormality No Abnormality N/A Tenderness on Palpation: Yes Yes N/A Wound Preparation: Ulcer Cleansing: Ulcer Cleansing: N/A Rinsed/Irrigated with Saline Rinsed/Irrigated with Saline Topical Anesthetic Applied: Topical Anesthetic Applied: None None Treatment Notes Electronic Signature(s) Signed: 08/21/2017 8:07:00 AM By: Linton Ham MD Entered By: Linton Ham on 08/20/2017 14:02:55 [name] ([id number]) -------------------------------------------------------------------------------- [place] Details Patient Name: Corey West, Corey A. Date of Service: 08/20/2017 1:30 PM Medical Record Number: [id number] Patient Account Number: [id number] Date of Birth/Sex: 12/26/1950 (66 y.o. Male) Treating RN: Ahmed Prima Primary Care Cesar Alf: [name] Other Clinician: Referring Tenessa Marsee: [name] Treating Mollye Guinta/Extender: Frann Rider in Treatment: 29 Active Inactive ` Orientation to the Wound Care Program Nursing Diagnoses: Knowledge deficit related to the wound healing center program Goals: Patient/caregiver will  verbalize understanding of the [place] Program Date Initiated: 04/23/2017 Target Resolution Date: 05/04/2017 Goal Status: Active Interventions: Provide education on orientation to the wound center Notes: ` Venous Leg Ulcer Nursing Diagnoses: Potential for venous Insuffiency (use before diagnosis confirmed) Goals: Patient will maintain optimal edema control Date Initiated: 04/23/2017 Target Resolution Date: 05/04/2017 Goal Status: Active Interventions: Compression as ordered Treatment Activities: Therapeutic compression applied : 04/23/2017 Notes: ` Wound/Skin Impairment Nursing Diagnoses: Impaired tissue integrity Goals: Ulcer/skin breakdown will have a volume reduction of 80% by week 12 Date Initiated: 04/23/2017 Target Resolution Date: 05/04/2017 TAJAY, MUZZY ([id number]) Goal Status: Active Interventions: Assess ulceration(s) every visit Notes: Electronic Signature(s) Signed: 08/20/2017 4:11:07 PM By: Alric Quan Entered By: Alric Quan on 08/20/2017 13:42:59 [name] ([id number]) -------------------------------------------------------------------------------- Pain Assessment Details Patient Name: Corey West A. Date of Service: 08/20/2017 1:30 PM Medical Record Number: [id number] Patient Account Number: [id number] Date of Birth/Sex: [birth year] (66 y.o. Male) Treating RN: Ahmed Prima Primary Care Jobin Montelongo: [name] Other Clinician: Referring Colton Engdahl: [name] Treating Laporsha Grealish/Extender: Frann Rider in Treatment: 17 Active Problems Location of Pain Severity and Description of Pain Patient Has Paino No Site Locations Pain Management and Medication Current Pain Management: Electronic Signature(s) Signed: 08/20/2017 4:11:07 PM By: Alric Quan Entered By: Alric Quan on 08/20/2017 13:26:19 [name]  ([id number]) -------------------------------------------------------------------------------- Patient/Caregiver Education  Details Patient Name: Corey West, MACLELLAN. Date of Service: 08/20/2017 1:30 PM Medical Record Number: [id number] Patient Account Number: [id number] Date of Birth/Gender: 25-Feb-1951 (66 y.o. Male) Treating RN: Ahmed Prima Primary Care Physician: [name] Other Clinician: Referring Physician: [name] Treating Physician/Extender: Tito Dine in Treatment: 17 Education Assessment Education Provided To: Patient Education Topics Provided Wound/Skin Impairment: Handouts: Other: do not get wraps wet Methods: Demonstration, Explain/Verbal Responses: State content correctly Electronic Signature(s) Signed: 08/20/2017 4:11:07 PM By: Alric Quan Entered By: Alric Quan on 08/20/2017 14:16:21 [name] ([id number]) -------------------------------------------------------------------------------- Wound Assessment Details Patient Name: Corey West A. Date of Service: 08/20/2017 1:30 PM Medical Record Number: [id number] Patient Account Number: [id number] Date of Birth/Sex: 1950-12-12 (66 y.o. Male) Treating RN: Ahmed Prima Primary Care Maebel Marasco: [name] Other Clinician: Referring Wyatte Dames: [name] Treating Lavanda Nevels/Extender: Frann Rider in Treatment: 17 Wound Status Wound Number: 3 Primary Lymphedema Etiology: Wound Location: Right Lower Leg - Circumfernential Secondary Diabetic Wound/Ulcer of the Lower Extremity Wounding Event: Gradually Appeared Etiology: Date Acquired: 01/14/2017 Wound Status: Open Weeks Of Treatment: 17 Comorbid Cataracts, Anemia, Sleep Apnea, Clustered Wound: No History: Hypertension, Cirrhosis , Type II Diabetes Photos Photo Uploaded By: Alric Quan on 08/20/2017 16:05:15 Wound Measurements Length: (cm) 11 Width: (cm) 45.6 Depth: (cm) 0.1 Area: (cm) 393.956 Volume: (cm)  39.396 % Reduction in Area: -923.7% % Reduction in Volume: -2.4% Epithelialization: None Tunneling: No Undermining: No Wound Description Classification: Partial Thickness Wound Margin: Flat and Intact Exudate Amount: Large Exudate Type: Serous Exudate Color: amber Foul Odor After Cleansing: Yes Due to Product Use: No Slough/Fibrino Yes Wound Bed Granulation Amount: Medium (34-66%) Exposed Structure Granulation Quality: Pink Fascia Exposed: No Necrotic Amount: Medium (34-66%) Fat Layer (Subcutaneous Tissue) Exposed: No Necrotic Quality: Adherent Slough Tendon Exposed: No Muscle Exposed: No Joint Exposed: No Bone Exposed: No Periwound Skin Texture Corey West, Corey A. ([id number]) Texture Color No Abnormalities Noted: No No Abnormalities Noted: No Callus: No Atrophie Blanche: No Crepitus: No Cyanosis: No Excoriation: No Ecchymosis: No Induration: No Erythema: No Rash: No Hemosiderin Staining: No Scarring: No Mottled: No Pallor: No Moisture Rubor: No No Abnormalities Noted: No Dry / Scaly: No Temperature / Pain Maceration: Yes Temperature: No Abnormality Tenderness on Palpation: Yes Wound Preparation Ulcer Cleansing: Rinsed/Irrigated with Saline Topical Anesthetic Applied: None Treatment Notes Wound #3 (Right, Circumferential Lower Leg) 1. Cleansed with: Clean wound with Normal Saline Cleanse wound with antibacterial soap and water 2. Anesthetic Topical Lidocaine 4% cream to wound bed prior to debridement 4. Dressing Applied: Other dressing (specify in notes) 5. Secondary Dressing Applied ABD Pad 7. Secured with Tape 3 Layer Compression System - Bilateral Notes silvercel, xtrasorb, unna to Engineer, production) Signed: 08/20/2017 4:11:07 PM By: Alric Quan Entered By: Alric Quan on 08/20/2017 13:40:13 [name] ([id number]) -------------------------------------------------------------------------------- Wound Assessment  Details Patient Name: Corey West, Corey A. Date of Service: 08/20/2017 1:30 PM Medical Record Number: [id number] Patient Account Number: [id number] Date of Birth/Sex: [birth year] (66 y.o. Male) Treating RN: Ahmed Prima Primary Care Sneha Willig: [name] Other Clinician: Referring Jonthan Leite: [name] Treating Garrell Flagg/Extender: Frann Rider in Treatment: 17 Wound Status Wound Number: 5 Primary Lymphedema Etiology: Wound Location: Left Lower Leg - Anterior Wound Open Wounding Event: Gradually Appeared Status: Date Acquired: 07/02/2017 Comorbid Cataracts, Anemia, Sleep Apnea, Weeks Of Treatment: 7 History: Hypertension, Cirrhosis , Type II Diabetes Clustered Wound: Yes Photos Photo Uploaded By: Alric Quan on 08/20/2017 16:05:39 Wound Measurements Length: (cm) 9 Width: (cm) 5 Depth: (cm) 0.1 Clustered Quantity: 2  Area: (cm) 35.343 Volume: (cm) 3.534 % Reduction in Area: -423.3% % Reduction in Volume: -423.6% Epithelialization: None Tunneling: No Undermining: No Wound Description Classification: Partial Thickness Wound Margin: Distinct, outline attached Exudate Amount: Large Exudate Type: Serous Exudate Color: amber Foul Odor After Cleansing: No Slough/Fibrino Yes Wound Bed Granulation Amount: Medium (34-66%) Granulation Quality: Pink Necrotic Amount: Medium (34-66%) Necrotic Quality: Adherent Slough Periwound Skin Texture Texture Color No Abnormalities Noted: No No Abnormalities Noted: No AXTON, CIHLAR ([id number]) Moisture Temperature / Pain No Abnormalities Noted: No Temperature: No Abnormality Maceration: Yes Tenderness on Palpation: Yes Wound Preparation Ulcer Cleansing: Rinsed/Irrigated with Saline Topical Anesthetic Applied: None Treatment Notes Wound #5 (Left, Anterior Lower Leg) 1. Cleansed with: Clean wound with Normal Saline Cleanse wound with antibacterial soap and water 2. Anesthetic Topical Lidocaine 4% cream to wound bed  prior to debridement 4. Dressing Applied: Other dressing (specify in notes) 5. Secondary Dressing Applied ABD Pad 7. Secured with Tape 3 Layer Compression System - Bilateral Notes silvercel, xtrasorb, unna to Engineer, production) Signed: 08/20/2017 4:11:07 PM By: Alric Quan Entered By: Alric Quan on 08/20/2017 13:40:22 [name] ([id number]) -------------------------------------------------------------------------------- [place] Details Patient Name: Corey West A. Date of Service: 08/20/2017 1:30 PM Medical Record Number: [id number] Patient Account Number: [id number] Date of Birth/Sex: [birth year] (66 y.o. Male) Treating RN: Ahmed Prima Primary Care Demontae Antunes: [name] Other Clinician: Referring Ezrah Dembeck: [name] Treating Rayyan Burley/Extender: Frann Rider in Treatment: 17 Vital Signs Time Taken: 13:32 Temperature (F): 98.1 Height (in): 69 Pulse (bpm): 78 Weight (lbs): 305.4 Respiratory Rate (breaths/min): 22 Body Mass Index (BMI): 45.1 Blood Pressure (mmHg): 176/54 Reference Range: 80 - 120 mg / dl Electronic Signature(s) Signed: 08/20/2017 4:11:07 PM By: Alric Quan Entered By: Alric Quan on 08/20/2017 13:32:58

## 2017-08-27 ENCOUNTER — Encounter: Payer: Medicare Other | Admitting: Internal Medicine

## 2017-08-27 DIAGNOSIS — E11622 Type 2 diabetes mellitus with other skin ulcer: Secondary | ICD-10-CM | POA: Diagnosis not present

## 2017-08-28 NOTE — Progress Notes (Signed)
Corey, West ([id number]) Visit Report for 08/27/2017 Arrival Information Details Patient Name: Corey West, Corey West. Date of Service: 08/27/2017 9:30 AM Medical Record Number: [id number] Patient Account Number: [id number] Date of Birth/Sex: 06/24/1951 (66 y.o. Male) Treating RN: Ahmed Prima Primary Care Emani Taussig: [name] Other Clinician: Referring Zaila Crew: [name] Treating Corrinna Karapetyan/Extender: Tito Dine in Treatment: 18 Visit Information History Since Last Visit All ordered tests and consults were completed: No Patient Arrived: Kasandra Knudsen Added or deleted any medications: No Arrival Time: 09:21 Any new allergies or adverse reactions: No Accompanied By: self Had a fall or experienced change in No Transfer Assistance: EasyPivot Patient activities of daily living that may affect Lift risk of falls: Patient Identification Verified: Yes Signs or symptoms of abuse/neglect since last visito No Secondary Verification Process Yes Hospitalized since last visit: No Completed: Has Dressing in Place as Prescribed: Yes Patient Requires Transmission-Based No Precautions: Has Compression in Place as Prescribed: Yes Patient Has Alerts: No Pain Present Now: No Electronic Signature(s) Signed: 08/27/2017 4:54:49 PM By: Alric Quan Entered By: Alric Quan on 08/27/2017 09:21:21 [name] ([id number]) -------------------------------------------------------------------------------- Encounter Discharge Information Details Patient Name: Corey West A. Date of Service: 08/27/2017 9:30 AM Medical Record Number: [id number] Patient Account Number: [id number] Date of Birth/Sex: 03-Jul-1951 (66 y.o. Male) Treating RN: Ahmed Prima Primary Care Luca Burston: [name] Other Clinician: Referring Maleik Vanderzee: [name] Treating Nahome Bublitz/Extender: Tito Dine in Treatment: 48 Encounter Discharge Information Items Discharge Pain Level: 0 Discharge  Condition: Stable Ambulatory Status: Cane Discharge Destination: Home Private Transportation: Auto Accompanied By: self Schedule Follow-up Appointment: Yes Medication Reconciliation completed and provided No to Patient/Care Sotirios Navarro: Clinical Summary of Care: Electronic Signature(s) Signed: 08/27/2017 4:54:49 PM By: Alric Quan Entered By: Alric Quan on 08/27/2017 09:41:28 [name] ([id number]) -------------------------------------------------------------------------------- Lower Extremity Assessment Details Patient Name: Corey, West A. Date of Service: 08/27/2017 9:30 AM Medical Record Number: [id number] Patient Account Number: [id number] Date of Birth/Sex: [birth year] (66 y.o. Male) Treating RN: Carolyne Fiscal, Debi Primary Care Katena Petitjean: [name] Other Clinician: Referring Moosa Bueche: [name] Treating Harlene Petralia/Extender: Tito Dine in Treatment: 18 Edema Assessment Assessed: [Left: No] [Right: No] [Left: Edema] [Right: :] Calf Left: Right: Point of Measurement: 32 cm From Medial Instep 45.5 cm 50.6 cm Ankle Left: Right: Point of Measurement: 13 cm From Medial Instep 28.5 cm 30 cm Vascular Assessment Pulses: Dorsalis Pedis Palpable: [Left:Yes] [Right:Yes] Posterior Tibial Extremity colors, hair growth, and conditions: Extremity Color: [Left:Red] [Right:Red] Temperature of Extremity: [Left:Warm] [Right:Warm] Capillary Refill: [Left:< 3 seconds] [Right:< 3 seconds] Toe Nail Assessment Left: Right: Thick: Yes Yes Discolored: Yes Yes Deformed: No No Improper Length and Hygiene: Yes Yes Electronic Signature(s) Signed: 08/27/2017 4:54:49 PM By: Alric Quan Entered By: Alric Quan on 08/27/2017 09:36:39 [name] ([id number]) -------------------------------------------------------------------------------- Multi Wound Chart Details Patient Name: Corey West A. Date of Service: 08/27/2017 9:30 AM Medical Record Number:  [id number] Patient Account Number: [id number] Date of Birth/Sex: Dec 14, 1950 (66 y.o. Male) Treating RN: Ahmed Prima Primary Care Teja Judice: [name] Other Clinician: Referring Talyssa Gibas: [name] Treating Kaizen Ibsen/Extender: Tito Dine in Treatment: 18 Vital Signs Height(in): 47 Pulse(bpm): 35 Weight(lbs): 305.4 Blood Pressure(mmHg): 153/54 Body Mass Index(BMI): 45 Temperature(F): 97.7 Respiratory Rate 20 (breaths/min): Photos: [3:No Photos] [5:No Photos] [N/A:N/A] Wound Location: [3:Right Lower Leg - Circumfernential] [5:Left Lower Leg - Anterior] [N/A:N/A] Wounding Event: [3:Gradually Appeared] [5:Gradually Appeared] [N/A:N/A] Primary Etiology: [3:Lymphedema] [5:Lymphedema] [N/A:N/A] Secondary Etiology: [3:Diabetic Wound/Ulcer of the Lower Extremity] [5:N/A] [N/A:N/A] Comorbid History: [3:Cataracts, Anemia, Sleep Apnea, Hypertension,  Cirrhosis , Type II Diabetes] [5:Cataracts, Anemia, Sleep Apnea, Hypertension, Cirrhosis , Type II Diabetes] [N/A:N/A] Date Acquired: [3:01/14/2017] [5:07/02/2017] [N/A:N/A] Weeks of Treatment: [3:18] [5:8] [N/A:N/A] Wound Status: [3:Open] [5:Open] [N/A:N/A] Clustered Wound: [3:No] [5:Yes] [N/A:N/A] Clustered Quantity: [3:N/A] [5:2] [N/A:N/A] Measurements L x W x D [3:11x45.5x0.1] [5:4x2.5x0.1] [N/A:N/A] (cm) Area (cm) : [3:393.092] [5:7.854] [N/A:N/A] Volume (cm) : [6:30.160] [5:0.785] [N/A:N/A] % Reduction in Area: [3:-921.40%] [5:-16.30%] [N/A:N/A] % Reduction in Volume: [3:-2.10%] [5:-16.30%] [N/A:N/A] Classification: [3:Partial Thickness] [5:Partial Thickness] [N/A:N/A] Exudate Amount: [3:Large] [5:Large] [N/A:N/A] Exudate Type: [3:Serous] [5:Serous] [N/A:N/A] Exudate Color: [3:amber] [5:amber] [N/A:N/A] Foul Odor After Cleansing: [3:Yes] [5:No] [N/A:N/A] Odor Anticipated Due to [3:No] [5:N/A] [N/A:N/A] Product Use: Wound Margin: [3:Flat and Intact] [5:Distinct, outline attached] [N/A:N/A] Granulation Amount:  [3:Medium (34-66%)] [5:Medium (34-66%)] [N/A:N/A] Granulation Quality: [3:Pink] [5:Pink] [N/A:N/A] Necrotic Amount: [3:Medium (34-66%)] [5:Medium (34-66%)] [N/A:N/A] Exposed Structures: [3:Fascia: No Fat Layer (Subcutaneous Tissue) Exposed: No Tendon: No Muscle: No] [5:N/A] [N/A:N/A] Joint: No Bone: No Epithelialization: None None N/A Periwound Skin Texture: Excoriation: No No Abnormalities Noted N/A Induration: No Callus: No Crepitus: No Rash: No Scarring: No Periwound Skin Moisture: Maceration: Yes Maceration: Yes N/A Dry/Scaly: No Periwound Skin Color: Atrophie Blanche: No No Abnormalities Noted N/A Cyanosis: No Ecchymosis: No Erythema: No Hemosiderin Staining: No Mottled: No Pallor: No Rubor: No Temperature: No Abnormality No Abnormality N/A Tenderness on Palpation: Yes Yes N/A Wound Preparation: Ulcer Cleansing: Ulcer Cleansing: N/A Rinsed/Irrigated with Saline Rinsed/Irrigated with Saline Topical Anesthetic Applied: Topical Anesthetic Applied: None None Treatment Notes Wound #3 (Right, Circumferential Lower Leg) 1. Cleansed with: Clean wound with Normal Saline Cleanse wound with antibacterial soap and water 4. Dressing Applied: Other dressing (specify in notes) 5. Secondary Dressing Applied ABD Pad 7. Secured with Tape 3 Layer Compression System - Bilateral Notes unna to anchor, xtrasorb, silvercel Wound #5 (Left, Anterior Lower Leg) 1. Cleansed with: Clean wound with Normal Saline Cleanse wound with antibacterial soap and water 4. Dressing Applied: Other dressing (specify in notes) 5. Secondary Dressing Applied ABD Pad 7. Secured with Tape 3 Layer Compression System - Bilateral Notes unna to anchor, xtrasorb, silvercel Corey West, Corey West ([id number]) Electronic Signature(s) Signed: 08/27/2017 4:33:39 PM By: Linton Ham MD Entered By: Linton Ham on 08/27/2017 09:56:18 [name]  ([id number]) -------------------------------------------------------------------------------- [place] Details Patient Name: Corey West, Corey West. Date of Service: 08/27/2017 9:30 AM Medical Record Number: [id number] Patient Account Number: [id number] Date of Birth/Sex: 12-14-1950 (66 y.o. Male) Treating RN: Ahmed Prima Primary Care Tatum Massman: [name] Other Clinician: Referring Jeraldine Primeau: [name] Treating Terek Bee/Extender: Tito Dine in Treatment: 29 Active Inactive ` Orientation to the Wound Care Program Nursing Diagnoses: Knowledge deficit related to the wound healing center program Goals: Patient/caregiver will verbalize understanding of the [facility] Date Initiated: 04/23/2017 Target Resolution Date: 05/04/2017 Goal Status: Active Interventions: Provide education on orientation to the wound center Notes: ` Venous Leg Ulcer Nursing Diagnoses: Potential for venous Insuffiency (use before diagnosis confirmed) Goals: Patient will maintain optimal edema control Date Initiated: 04/23/2017 Target Resolution Date: 05/04/2017 Goal Status: Active Interventions: Compression as ordered Treatment Activities: Therapeutic compression applied : 04/23/2017 Notes: ` Wound/Skin Impairment Nursing Diagnoses: Impaired tissue integrity Goals: Ulcer/skin breakdown will have a volume reduction of 80% by week 12 Date Initiated: 04/23/2017 Target Resolution Date: 05/04/2017 Corey West, Corey West ([id number]) Goal Status: Active Interventions: Assess ulceration(s) every visit Notes: Electronic Signature(s) Signed: 08/27/2017 4:54:49 PM By: Alric Quan Entered By: Alric Quan on 08/27/2017 09:36:44 [name] ([id number]) -------------------------------------------------------------------------------- Pain Assessment Details Patient  Name: Corey West, Corey A. Date of Service: 08/27/2017 9:30 AM Medical Record Number:  [id number] Patient Account Number: [id number] Date of Birth/Sex: 02/16/1951 (66 y.o. Male) Treating RN: Ahmed Prima Primary Care Lacheryl Niesen: [name] Other Clinician: Referring Akeila Lana: [name] Treating Vere Diantonio/Extender: Tito Dine in Treatment: 18 Active Problems Location of Pain Severity and Description of Pain Patient Has Paino No Site Locations Pain Management and Medication Current Pain Management: Electronic Signature(s) Signed: 08/27/2017 4:54:49 PM By: Alric Quan Entered By: Alric Quan on 08/27/2017 09:21:26 [name] ([id number]) -------------------------------------------------------------------------------- Patient/Caregiver Education Details Patient Name: Corey West A. Date of Service: 08/27/2017 9:30 AM Medical Record Number: [id number] Patient Account Number: [id number] Date of Birth/Gender: Feb 18, 1951 (66 y.o. Male) Treating RN: Ahmed Prima Primary Care Physician: [name] Other Clinician: Referring Physician: [name] Treating Physician/Extender: Tito Dine in Treatment: 45 Education Assessment Education Provided To: Patient Education Topics Provided Wound/Skin Impairment: Handouts: Other: do not get wraps wet Methods: Demonstration, Explain/Verbal Responses: State content correctly Electronic Signature(s) Signed: 08/27/2017 4:54:49 PM By: Alric Quan Entered By: Alric Quan on 08/27/2017 09:41:43 [name] ([id number]) -------------------------------------------------------------------------------- Wound Assessment Details Patient Name: Corey West A. Date of Service: 08/27/2017 9:30 AM Medical Record Number: [id number] Patient Account Number: [id number] Date of Birth/Sex: [birth year] (67 y.o. Male) Treating RN: Carolyne Fiscal, Debi Primary Care Rania Prothero: [name] Other Clinician: Referring Zania Kalisz: [name] Treating Vittorio Mohs/Extender: Tito Dine in  Treatment: 18 Wound Status Wound Number: 3 Primary Lymphedema Etiology: Wound Location: Right Lower Leg - Circumfernential Secondary Diabetic Wound/Ulcer of the Lower Extremity Wounding Event: Gradually Appeared Etiology: Date Acquired: 01/14/2017 Wound Status: Open Weeks Of Treatment: 18 Comorbid Cataracts, Anemia, Sleep Apnea, Clustered Wound: No History: Hypertension, Cirrhosis , Type II Diabetes Photos Photo Uploaded By: Alric Quan on 08/27/2017 13:31:25 Wound Measurements Length: (cm) 11 Width: (cm) 45.5 Depth: (cm) 0.1 Area: (cm) 393.092 Volume: (cm) 39.309 % Reduction in Area: -921.4% % Reduction in Volume: -2.1% Epithelialization: None Tunneling: No Undermining: No Wound Description Classification: Partial Thickness Wound Margin: Flat and Intact Exudate Amount: Large Exudate Type: Serous Exudate Color: amber Foul Odor After Cleansing: Yes Due to Product Use: No Slough/Fibrino Yes Wound Bed Granulation Amount: Medium (34-66%) Exposed Structure Granulation Quality: Pink Fascia Exposed: No Necrotic Amount: Medium (34-66%) Fat Layer (Subcutaneous Tissue) Exposed: No Necrotic Quality: Adherent Slough Tendon Exposed: No Muscle Exposed: No Joint Exposed: No Bone Exposed: No Periwound Skin Texture Corey West, Corey West A. ([id number]) Texture Color No Abnormalities Noted: No No Abnormalities Noted: No Callus: No Atrophie Blanche: No Crepitus: No Cyanosis: No Excoriation: No Ecchymosis: No Induration: No Erythema: No Rash: No Hemosiderin Staining: No Scarring: No Mottled: No Pallor: No Moisture Rubor: No No Abnormalities Noted: No Dry / Scaly: No Temperature / Pain Maceration: Yes Temperature: No Abnormality Tenderness on Palpation: Yes Wound Preparation Ulcer Cleansing: Rinsed/Irrigated with Saline Topical Anesthetic Applied: None Treatment Notes Wound #3 (Right, Circumferential Lower Leg) 1. Cleansed with: Clean wound with Normal  Saline Cleanse wound with antibacterial soap and water 4. Dressing Applied: Other dressing (specify in notes) 5. Secondary Dressing Applied ABD Pad 7. Secured with Tape 3 Layer Compression System - Bilateral Notes unna to anchor, xtrasorb, silvercel Electronic Signature(s) Signed: 08/27/2017 4:54:49 PM By: Alric Quan Entered By: Alric Quan on 08/27/2017 09:34:49 [name] ([id number]) -------------------------------------------------------------------------------- Wound Assessment Details Patient Name: Corey West, Corey West A. Date of Service: 08/27/2017 9:30 AM Medical Record Number: [id number] Patient Account Number: [id number] Date of Birth/Sex: [birth year] (66 y.o. Male) Treating RN: Carolyne Fiscal, Debi Primary  Care Natane Heward: [name] Other Clinician: Referring Caylea Foronda: [name] Treating Zoeie Ritter/Extender: Tito Dine in Treatment: 18 Wound Status Wound Number: 5 Primary Lymphedema Etiology: Wound Location: Left Lower Leg - Anterior Wound Open Wounding Event: Gradually Appeared Status: Date Acquired: 07/02/2017 Comorbid Cataracts, Anemia, Sleep Apnea, Weeks Of Treatment: 8 History: Hypertension, Cirrhosis , Type II Diabetes Clustered Wound: Yes Photos Photo Uploaded By: Alric Quan on 08/27/2017 13:31:44 Wound Measurements Length: (cm) 4 Width: (cm) 2.5 Depth: (cm) 0.1 Clustered Quantity: 2 Area: (cm) 7.854 Volume: (cm) 0.785 % Reduction in Area: -16.3% % Reduction in Volume: -16.3% Epithelialization: None Tunneling: No Undermining: No Wound Description Classification: Partial Thickness Wound Margin: Distinct, outline attached Exudate Amount: Large Exudate Type: Serous Exudate Color: amber Foul Odor After Cleansing: No Slough/Fibrino Yes Wound Bed Granulation Amount: Medium (34-66%) Granulation Quality: Pink Necrotic Amount: Medium (34-66%) Necrotic Quality: Adherent Slough Periwound Skin Texture Texture Color No  Abnormalities Noted: No No Abnormalities Noted: No Corey West, Corey West ([id number]) Moisture Temperature / Pain No Abnormalities Noted: No Temperature: No Abnormality Maceration: Yes Tenderness on Palpation: Yes Wound Preparation Ulcer Cleansing: Rinsed/Irrigated with Saline Topical Anesthetic Applied: None Treatment Notes Wound #5 (Left, Anterior Lower Leg) 1. Cleansed with: Clean wound with Normal Saline Cleanse wound with antibacterial soap and water 4. Dressing Applied: Other dressing (specify in notes) 5. Secondary Dressing Applied ABD Pad 7. Secured with Tape 3 Layer Compression System - Bilateral Notes unna to anchor, xtrasorb, silvercel Electronic Signature(s) Signed: 08/27/2017 4:54:49 PM By: Alric Quan Entered By: Alric Quan on 08/27/2017 09:35:49 [name] ([id number]) -------------------------------------------------------------------------------- [place] Details Patient Name: Corey West A. Date of Service: 08/27/2017 9:30 AM Medical Record Number: [id number] Patient Account Number: [id number] Date of Birth/Sex: 30-Jul-1951 (66 y.o. Male) Treating RN: Ahmed Prima Primary Care Deandre Stansel: [name] Other Clinician: Referring Chais Fehringer: [name] Treating Bohdi Leeds/Extender: Tito Dine in Treatment: 18 Vital Signs Time Taken: 09:21 Temperature (F): 97.7 Height (in): 69 Pulse (bpm): 71 Weight (lbs): 305.4 Respiratory Rate (breaths/min): 20 Body Mass Index (BMI): 45.1 Blood Pressure (mmHg): 153/54 Reference Range: 80 - 120 mg / dl Electronic Signature(s) Signed: 08/27/2017 4:54:49 PM By: Alric Quan Entered By: Alric Quan on 08/27/2017 09:24:00

## 2017-08-28 NOTE — Progress Notes (Signed)
Corey West ([id number]) Visit Report for 08/27/2017 HPI Details Patient Name: Corey West, Corey West. Date of Service: 08/27/2017 9:30 AM Medical Record Number: [id number] Patient Account Number: [id number] Date of Birth/Sex: 01-May-1951 (66 y.o. Male) Treating RN: Ahmed Prima Primary Care Provider: [name] Other Clinician: Referring Provider: [name] Treating Provider/Extender: Tito Dine in Treatment: 18 History of Present Illness HPI Description: Pleasant 66 year old with a past medical history significant for morbid obesity, insulin-dependent type 2 diabetes (hemoglobin A1c 7.2 in January 2015), cirrhosis, and chronic kidney disease. He reports a left anterior calf ulcer since January 2015. He has had venous stasis ulcers in the past, which have healed spontaneously. No history of DVT. Does not regularly wear compression. Ambulating normally per his baseline. Recently completed a course of antibiotics. Treated with a Profore light and Coban 2 layer compression drainage, which both fell down and he cut off. Tolerated Coban lite compression bandage. Returns to clinic for followup. No complaints today. No pain at this time. No fever or chills. No open wounds at this time. He is having difficulty finding compression garments that fit him. 04/23/17; this is now 66 year old man who returns to our clinic for nonhealing wounds on the right lateral and right posterior calf. He tells me that they have been present for 6 months. In the setting of severe right greater than left lower extremity lymphedema likely secondary to chronic venous insufficiency. He has been followed in the lymphedema clinic at least since early June with compression wraps although these have not really helped and the patient is here for our review of this situation. The patient has had problems with edema in his legs for years. He has not worn compression. He is type II diabetic that sounds  poorly controlled. He also has obstructive sleep apnea and cirrhosis. He is occasional cigar smoker 04/30/17; patient is a 66 year old man who has severe bilateral lower extremity lymphedema stage III since his early 23s he tells me. This is no doubt secondary to chronic venous insufficiency. He was going to the lymphedema clinic who forwarded him here due to skin breakdown in the right leg. He has not previously worn compression stockings. He has a large area of denuded epithelium on the posterior leg. He has a more open wound on the right lateral leg 05/07/17 on evaluation today patient appears to be doing about the same his wounds are showing signs of improvement and looking fairly well. With that being said he is not having any severe discomfort just some mild aching due to the swelling and really this is worse on the left than the right and the right side is where he actually has his wounds. He has been tolerating the [employer] wraps without complication and states this is actually much better than the wraps that he was getting at the lymphedema clinic most recently. He is happy with these wraps. He has no nausea, vomiting, diarrhea and no fevers or chills. We are still waiting to hear back in regard to the lymphedema pumps which I do think would be beneficial for him even with the compression wraps he still has a significant amount of edema noted of the bilateral lower extremities. 05/15/17 on evaluation today patient's right lower extremity edema and weeping especially the posterior portion appears to be doing better. He still is having some issue here but fortunately it's minimal compared to previous weeks. He continues to tolerate the compression fairly well. Overall he is pleased with how things are progressing.  No fevers, chills, nausea, or vomiting noted at this time. And patient is having only minimal discomfort. 05/22/17; the medial wound on the right leg actually looks considerably better  and is epithelialized. He still has a small punched out area on the right lateral leg. Edema control is marginal end of the [employer]. We are working on getting him compression pumps 05/29/17; the patient's lymphedema pumps are apparently arriving tonight. In the meantime he took his Unna boots off last Sunday because the edema had progressed to the point that a word to titrate. Since then he has put nothing on his right leg and more specifically nothing on the right medial and lateral wounds 06/05/17; the patient has his lymphedema pumps and he is using them once a day on the right leg. There is a sizable reduction in the circumference of his leg although he arrived in clinic today somewhat short of breath. 07/02/17; since the patient was last in clinic he was admitted on 2 occasions. He was admitted from 9/12 through 9/13 with [name], [name] ([id number]) lactic acidosis weakness and felt to have left lower extremity cellulitis. Due to the elevated lactic acid level he was felt to have sepsis although this was ruled out and the lactic acidosis was felt secondary to his chronic cirrhosis. He was initially treated with IV vancomycin for cellulitis and redness of his left leg but then this was subsequently recognized to be secondary to chronic venous stasis and chronic lymphedema. When he left the hospital he had Unna boots placed bilaterally. He was readmitted on 9/26 through 9/28 with GI bleeding. His EGD showed Barrett's esophagus and gastritis. He was discharged with a hemoglobin of 8 to follow-up with GI as an outpatient. There was nothing more about his lower extremities during this short hospital stay. The patient has not been using his external compression pumps fearing fluid overload and shortness of breath although he feels better now that his hemoglobin is stabilized. He continues to have epithelial loss on his bilateral lower extremities. 07/09/17; patient has a large but superficial area on  the right posterior leg and on the left anterior leg. He tells me he is been using his external compression pumps once a day for the required hour. He states the pumps hurt his right ankle. 07/17/17; patient was transfused a further 2 units since he was here the last time. His hemoglobin was once again found to be low. He has not been able to use his external compression pumps because of a combination of prolonged power outage in his home and I think issues with his other medical care. His wounds have actually worsened he now has loss of epithelialization all the way from the right lateral to the right medial posteriorly. The left side which has anterior loss of epithelialization also appears to be worse on the lateral aspect of the leg where there is denuded epithelium now as well. Leaking edema fluid. He has bilateral lymphedema. 08/12/2017 -- the patient has not been here for 25 days and has had the same compression wraps which have not been changed!! He says he was being worked up for a GI bleed and had some transfusions since he was seen last by Dr. Dellia Nims 08/20/17; mostly by review of what the patient is saying and a quick glance at his records in care everywhere I think this patient probably has Karlene Lineman, cirrhosis and portal hypertension. He is bleeding I think from gastric area and he is requiring recurrent transfusions. He  has been told that there is nothing more that can be done and that his prognosis is poor. He arrived in our office in our office in tears. We've been using silver alginate and 3 layer compression to his bilateral lower extremity wounds. He has significant lymphedema likely secondary to chronic venous insufficiency. He has external compression pumps at home that he had actually before he came to this clinic but he is not using 08/27/17; patient is doing well, wounds on his left leg are tiny one anteriorly and one laterally. He still has substantial wound on the posterior  aspect of his right calf extending laterally for over this looks better than last time. He tells me he is able to use his compression pumps once a day on most days. He has his hemoglobin checked on Friday Electronic Signature(s) Signed: 08/27/2017 4:33:39 PM By: Linton Ham MD Entered By: Linton Ham on 08/27/2017 09:57:43 Corey West ([id number]) -------------------------------------------------------------------------------- Physical Exam Details Patient Name: Corey West, Corey A. Date of Service: 08/27/2017 9:30 AM Medical Record Number: [id number] Patient Account Number: [id number] Date of Birth/Sex: 1951-01-31 (66 y.o. Male) Treating RN: Ahmed Prima Primary Care Provider: [name] Other Clinician: Referring Provider: [name] Treating Provider/Extender: Tito Dine in Treatment: 76 Constitutional Patient is hypertensive.. Pulse regular and within target range for patient.Marland Kitchen Respirations regular, non-labored and within target range.. Temperature is normal and within the target range for the patient.Marland Kitchen appears in no distress. Eyes Conjunctivae clear. No discharge. Respiratory Respiratory effort is easy and symmetric bilaterally. Rate is normal at rest and on room air.. Cardiovascular Pedal pulses palpable bilaterally. Edema present in both extremities. It is controlled for the most part and his venous inflammation is better. Edema extends into his posterior thighs. Lymphatic Unpalpable no popliteal or inguinal area bilaterally. Psychiatric No evidence of depression, anxiety, or agitation. Calm, cooperative, and communicative. Appropriate interactions and affect.. Notes Wound exam; right posterior lower extremity extending laterally base of the wound is reasonably clean. We will continue with the same silver alginate based dressings under poor alert compression oHas 2 small open areas on the left leg the should close Electronic Signature(s) Signed:  08/27/2017 4:33:39 PM By: Linton Ham MD Entered By: Linton Ham on 08/27/2017 09:59:24 Corey West ([id number]) -------------------------------------------------------------------------------- Physician Orders Details Patient Name: Corey West, Corey A. Date of Service: 08/27/2017 9:30 AM Medical Record Number: [id number] Patient Account Number: [id number] Date of Birth/Sex: Jul 01, 1951 (66 y.o. Male) Treating RN: Ahmed Prima Primary Care Provider: [name] Other Clinician: Referring Provider: [name] Treating Provider/Extender: Tito Dine in Treatment: 9 Verbal / Phone Orders: Yes Clinician: Carolyne Fiscal, Debi Read Back and Verified: Yes Diagnosis Coding Wound Cleansing Wound #3 Right,Circumferential Lower Leg o Clean wound with Normal Saline. o Cleanse wound with mild soap and water Wound #5 Left,Anterior Lower Leg o Clean wound with Normal Saline. o Cleanse wound with mild soap and water Anesthetic Wound #3 Right,Circumferential Lower Leg o Topical Lidocaine 4% cream applied to wound bed prior to debridement Wound #5 Left,Anterior Lower Leg o Topical Lidocaine 4% cream applied to wound bed prior to debridement Skin Barriers/Peri-Wound Care Wound #3 Right,Circumferential Lower Leg o Triamcinolone Acetonide Ointment Wound #5 Left,Anterior Lower Leg o Triamcinolone Acetonide Ointment Primary Wound Dressing Wound #3 Right,Circumferential Lower Leg o Silvercel Non-Adherent Wound #5 Left,Anterior Lower Leg o Silvercel Non-Adherent Secondary Dressing Wound #3 Right,Circumferential Lower Leg o ABD pad o XtraSorb Wound #5 Left,Anterior Lower Leg o ABD pad o XtraSorb Dressing Change Frequency Wound #3 Right,Circumferential  Lower Leg o Change dressing every week NAASIR, CARREIRA ([id number]) Wound #5 Left,Anterior Lower Leg o Change dressing every week Follow-up Appointments Wound #3 Right,Circumferential Lower  Leg o Return Appointment in 1 week. o Nurse Visit as needed Wound #5 Left,Anterior Lower Leg o Return Appointment in 1 week. o Nurse Visit as needed Edema Control Wound #3 Right,Circumferential Lower Leg o 3 Layer Compression System - Right Lower Extremity - unna to anchor o Compression Pump: Use compression pump on left lower extremity for 30 minutes, twice daily. Wound #5 Left,Anterior Lower Leg o 3 Layer Compression System - Right Lower Extremity - unna to anchor o Compression Pump: Use compression pump on left lower extremity for 30 minutes, twice daily. Additional Orders / Instructions Wound #3 Right,Circumferential Lower Leg o Increase protein intake. o Other: - Get Blood Sugars below 150 Wound #5 Left,Anterior Lower Leg o Increase protein intake. o Other: - Get Blood Sugars below 150 Electronic Signature(s) Signed: 08/27/2017 4:33:39 PM By: Linton Ham MD Signed: 08/27/2017 4:54:49 PM By: Alric Quan Entered By: Alric Quan on 08/27/2017 09:42:58 Corey West ([id number]) -------------------------------------------------------------------------------- Problem List Details Patient Name: Corey West, Corey A. Date of Service: 08/27/2017 9:30 AM Medical Record Number: [id number] Patient Account Number: [id number] Date of Birth/Sex: 1951/05/04 (66 y.o. Male) Treating RN: Ahmed Prima Primary Care Provider: [name] Other Clinician: Referring Provider: [name] Treating Provider/Extender: Tito Dine in Treatment: 21 Active Problems ICD-10 Encounter Code Description Active Date Diagnosis I89.0 Lymphedema, not elsewhere classified 04/23/2017 Yes L97.211 Non-pressure chronic ulcer of right calf limited to breakdown of skin 04/23/2017 Yes I87.331 Chronic venous hypertension (idiopathic) with ulcer and 04/23/2017 Yes inflammation of right lower extremity E11.622 Type 2 diabetes mellitus with other skin ulcer 04/23/2017  Yes I89.9 Noninfective disorder of lymphatic vessels and lymph nodes, 04/23/2017 Yes unspecified Inactive Problems Resolved Problems Electronic Signature(s) Signed: 08/27/2017 4:33:39 PM By: Linton Ham MD Entered By: Linton Ham on 08/27/2017 09:56:06 Corey West ([id number]) -------------------------------------------------------------------------------- Progress Note Details Patient Name: Corey West. Date of Service: 08/27/2017 9:30 AM Medical Record Number: [id number] Patient Account Number: [id number] Date of Birth/Sex: [birth year] (66 y.o. Male) Treating RN: Ahmed Prima Primary Care Provider: [name] Other Clinician: Referring Provider: [name] Treating Provider/Extender: Tito Dine in Treatment: 18 Subjective History of Present Illness (HPI) Pleasant 66 year old with a past medical history significant for morbid obesity, insulin-dependent type 2 diabetes (hemoglobin A1c 7.2 in January 2015), cirrhosis, and chronic kidney disease. He reports a left anterior calf ulcer since January 2015. He has had venous stasis ulcers in the past, which have healed spontaneously. No history of DVT. Does not regularly wear compression. Ambulating normally per his baseline. Recently completed a course of antibiotics. Treated with a Profore light and Coban 2 layer compression drainage, which both fell down and he cut off. Tolerated Coban lite compression bandage. Returns to clinic for followup. No complaints today. No pain at this time. No fever or chills. No open wounds at this time. He is having difficulty finding compression garments that fit him. 04/23/17; this is now 66 year old man who returns to our clinic for nonhealing wounds on the right lateral and right posterior calf. He tells me that they have been present for 6 months. In the setting of severe right greater than left lower extremity lymphedema likely secondary to chronic venous insufficiency.  He has been followed in the lymphedema clinic at least since early June with compression wraps although these have not really helped and the  patient is here for our review of this situation. The patient has had problems with edema in his legs for years. He has not worn compression. He is type II diabetic that sounds poorly controlled. He also has obstructive sleep apnea and cirrhosis. He is occasional cigar smoker 04/30/17; patient is a 66 year old man who has severe bilateral lower extremity lymphedema stage III since his early 62s he tells me. This is no doubt secondary to chronic venous insufficiency. He was going to the lymphedema clinic who forwarded him here due to skin breakdown in the right leg. He has not previously worn compression stockings. He has a large area of denuded epithelium on the posterior leg. He has a more open wound on the right lateral leg 05/07/17 on evaluation today patient appears to be doing about the same his wounds are showing signs of improvement and looking fairly well. With that being said he is not having any severe discomfort just some mild aching due to the swelling and really this is worse on the left than the right and the right side is where he actually has his wounds. He has been tolerating the [employer] wraps without complication and states this is actually much better than the wraps that he was getting at the lymphedema clinic most recently. He is happy with these wraps. He has no nausea, vomiting, diarrhea and no fevers or chills. We are still waiting to hear back in regard to the lymphedema pumps which I do think would be beneficial for him even with the compression wraps he still has a significant amount of edema noted of the bilateral lower extremities. 05/15/17 on evaluation today patient's right lower extremity edema and weeping especially the posterior portion appears to be doing better. He still is having some issue here but fortunately it's minimal  compared to previous weeks. He continues to tolerate the compression fairly well. Overall he is pleased with how things are progressing. No fevers, chills, nausea, or vomiting noted at this time. And patient is having only minimal discomfort. 05/22/17; the medial wound on the right leg actually looks considerably better and is epithelialized. He still has a small punched out area on the right lateral leg. Edema control is marginal end of the [employer]. We are working on getting him compression pumps 05/29/17; the patient's lymphedema pumps are apparently arriving tonight. In the meantime he took his Unna boots off last Sunday because the edema had progressed to the point that a word to titrate. Since then he has put nothing on his right leg and more specifically nothing on the right medial and lateral wounds 06/05/17; the patient has his lymphedema pumps and he is using them once a day on the right leg. There is a sizable reduction in the circumference of his leg although he arrived in clinic today somewhat short of breath. 07/02/17; since the patient was last in clinic he was admitted on 2 occasions. He was admitted from 9/12 through 9/13 with lactic acidosis weakness and felt to have left lower extremity cellulitis. Due to the elevated lactic acid level he was felt to have sepsis although this was ruled out and the lactic acidosis was felt secondary to his chronic cirrhosis. He was initially treated Corey West, Corey West ([id number]) with IV vancomycin for cellulitis and redness of his left leg but then this was subsequently recognized to be secondary to chronic venous stasis and chronic lymphedema. When he left the hospital he had Unna boots placed bilaterally. He was readmitted  on 9/26 through 9/28 with GI bleeding. His EGD showed Barrett's esophagus and gastritis. He was discharged with a hemoglobin of 8 to follow-up with GI as an outpatient. There was nothing more about his lower extremities during  this short hospital stay. The patient has not been using his external compression pumps fearing fluid overload and shortness of breath although he feels better now that his hemoglobin is stabilized. He continues to have epithelial loss on his bilateral lower extremities. 07/09/17; patient has a large but superficial area on the right posterior leg and on the left anterior leg. He tells me he is been using his external compression pumps once a day for the required hour. He states the pumps hurt his right ankle. 07/17/17; patient was transfused a further 2 units since he was here the last time. His hemoglobin was once again found to be low. He has not been able to use his external compression pumps because of a combination of prolonged power outage in his home and I think issues with his other medical care. His wounds have actually worsened he now has loss of epithelialization all the way from the right lateral to the right medial posteriorly. The left side which has anterior loss of epithelialization also appears to be worse on the lateral aspect of the leg where there is denuded epithelium now as well. Leaking edema fluid. He has bilateral lymphedema. 08/12/2017 -- the patient has not been here for 25 days and has had the same compression wraps which have not been changed!! He says he was being worked up for a GI bleed and had some transfusions since he was seen last by Dr. Dellia Nims 08/20/17; mostly by review of what the patient is saying and a quick glance at his records in care everywhere I think this patient probably has Karlene Lineman, cirrhosis and portal hypertension. He is bleeding I think from gastric area and he is requiring recurrent transfusions. He has been told that there is nothing more that can be done and that his prognosis is poor. He arrived in our office in our office in tears. We've been using silver alginate and 3 layer compression to his bilateral lower extremity wounds. He has  significant lymphedema likely secondary to chronic venous insufficiency. He has external compression pumps at home that he had actually before he came to this clinic but he is not using 08/27/17; patient is doing well, wounds on his left leg are tiny one anteriorly and one laterally. He still has substantial wound on the posterior aspect of his right calf extending laterally for over this looks better than last time. He tells me he is able to use his compression pumps once a day on most days. He has his hemoglobin checked on Friday Objective Constitutional Patient is hypertensive.. Pulse regular and within target range for patient.Marland Kitchen Respirations regular, non-labored and within target range.. Temperature is normal and within the target range for the patient.Marland Kitchen appears in no distress. Vitals Time Taken: 9:21 AM, Height: 69 in, Weight: 305.4 lbs, BMI: 45.1, Temperature: 97.7 F, Pulse: 71 bpm, Respiratory Rate: 20 breaths/min, Blood Pressure: 153/54 mmHg. Eyes Conjunctivae clear. No discharge. Respiratory Respiratory effort is easy and symmetric bilaterally. Rate is normal at rest and on room air.. Cardiovascular Pedal pulses palpable bilaterally. Edema present in both extremities. It is controlled for the most part and his venous inflammation is better. Edema extends into his posterior thighs. Lymphatic Corey West, Corey West ([id number]) Unpalpable no popliteal or inguinal area bilaterally. Psychiatric No evidence  of depression, anxiety, or agitation. Calm, cooperative, and communicative. Appropriate interactions and affect.. General Notes: Wound exam; right posterior lower extremity extending laterally base of the wound is reasonably clean. We will continue with the same silver alginate based dressings under poor alert compression Has 2 small open areas on the left leg the should close Integumentary (Hair, Skin) Wound #3 status is Open. Original cause of wound was Gradually Appeared. The wound is  located on the Right,Circumferential Lower Leg. The wound measures 11cm length x 45.5cm width x 0.1cm depth; 393.092cm^2 area and 39.309cm^3 volume. There is no tunneling or undermining noted. There is a large amount of serous drainage noted. Foul odor after cleansing was noted. The wound margin is flat and intact. There is medium (34-66%) pink granulation within the wound bed. There is a medium (34-66%) amount of necrotic tissue within the wound bed including Adherent Slough. The periwound skin appearance exhibited: Maceration. The periwound skin appearance did not exhibit: Callus, Crepitus, Excoriation, Induration, Rash, Scarring, Dry/Scaly, Atrophie Blanche, Cyanosis, Ecchymosis, Hemosiderin Staining, Mottled, Pallor, Rubor, Erythema. Periwound temperature was noted as No Abnormality. The periwound has tenderness on palpation. Wound #5 status is Open. Original cause of wound was Gradually Appeared. The wound is located on the Left,Anterior Lower Leg. The wound measures 4cm length x 2.5cm width x 0.1cm depth; 7.854cm^2 area and 0.785cm^3 volume. There is no tunneling or undermining noted. There is a large amount of serous drainage noted. The wound margin is distinct with the outline attached to the wound base. There is medium (34-66%) pink granulation within the wound bed. There is a medium (34-66%) amount of necrotic tissue within the wound bed including Adherent Slough. The periwound skin appearance exhibited: Maceration. Periwound temperature was noted as No Abnormality. The periwound has tenderness on palpation. 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 Cleanse wound with mild soap and water Wound #5 Left,Anterior Lower Leg: Clean wound with Normal Saline. Cleanse wound with mild soap and water Anesthetic: Wound #3 Right,Circumferential Lower Leg: Topical Lidocaine 4% cream applied to wound bed prior to debridement Corey West, Corey West ([id number]) Wound #5 Left,Anterior Lower Leg: Topical Lidocaine 4% cream applied to wound bed prior to debridement Skin Barriers/Peri-Wound Care: Wound #3 Right,Circumferential Lower Leg: Triamcinolone Acetonide Ointment Wound #5 Left,Anterior Lower Leg: Triamcinolone Acetonide Ointment Primary Wound Dressing: Wound #3 Right,Circumferential Lower Leg: Silvercel Non-Adherent Wound #5 Left,Anterior Lower Leg: Silvercel Non-Adherent Secondary Dressing: Wound #3 Right,Circumferential Lower Leg: ABD pad XtraSorb Wound #5 Left,Anterior Lower Leg: ABD pad XtraSorb Dressing Change Frequency: Wound #3 Right,Circumferential Lower Leg: Change dressing every week Wound #5 Left,Anterior Lower Leg: Change dressing every week Follow-up Appointments: Wound #3 Right,Circumferential Lower Leg: Return Appointment in 1 week. Nurse Visit as needed Wound #5 Left,Anterior Lower Leg: Return Appointment in 1 week. Nurse Visit as needed Edema Control: Wound #3 Right,Circumferential Lower Leg: 3 Layer Compression System - Right Lower Extremity - unna to anchor Compression Pump: Use compression pump on left lower extremity for 30 minutes, twice daily. Wound #5 Left,Anterior Lower Leg: 3 Layer Compression System - Right Lower Extremity - unna to anchor Compression Pump: Use compression pump on  left lower extremity for 30 minutes, twice daily. Additional Orders / Instructions: Wound #3 Right,Circumferential Lower Leg: Increase protein intake. Other: - Get Blood Sugars below 150 Wound #5 Left,Anterior  Lower Leg: Increase protein intake. Other: - Get Blood Sugars below 150 Continue silver cell,aBDs under 4 layer compression continue External compession pumps Electronic Signature(s) Signed: 08/27/2017 4:33:39 PM By: Linton Ham MD Corey West ([id number]) Entered By: Linton Ham on 08/27/2017 10:00:28 Corey West ([id number]) -------------------------------------------------------------------------------- [place] Details Patient Name: [name] Date of Service: 08/27/2017 Medical Record Number: [id number] Patient Account Number: [id number] Date of Birth/Sex: 07-07-1951 (66 y.o. 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 Physician Procedures CPT4: Description Modifier Quantity Code [id number] - WC PHYS LEVEL 3 - EST PT 1 ICD-10 Diagnosis Description L97.211 Non-pressure chronic ulcer of right calf limited to breakdown of skin I87.331 Chronic venous hypertension (idiopathic) with ulcer and  inflammation of right lower extremity Electronic Signature(s) Signed: 08/27/2017 11:48:07 AM By: Alric Quan Signed: 08/27/2017 4:33:39 PM By: Linton Ham MD Entered By: Alric Quan on 08/27/2017 11:48:06

## 2017-09-03 ENCOUNTER — Ambulatory Visit: Payer: Medicare Other | Admitting: Physician Assistant

## 2017-09-03 ENCOUNTER — Emergency Department: Payer: Medicare Other

## 2017-09-03 ENCOUNTER — Other Ambulatory Visit: Payer: Self-pay

## 2017-09-03 ENCOUNTER — Inpatient Hospital Stay
Admission: EM | Admit: 2017-09-03 | Discharge: 2017-09-05 | DRG: 603 | Disposition: A | Discharge status: Home / Self Care | Payer: Medicare Other | Attending: Internal Medicine | Admitting: Internal Medicine

## 2017-09-03 DIAGNOSIS — N179 Acute kidney failure, unspecified: Secondary | ICD-10-CM | POA: Diagnosis present

## 2017-09-03 DIAGNOSIS — Y92009 Unspecified place in unspecified non-institutional (private) residence as the place of occurrence of the external cause: Secondary | ICD-10-CM | POA: Diagnosis not present

## 2017-09-03 DIAGNOSIS — R410 Disorientation, unspecified: Secondary | ICD-10-CM | POA: Diagnosis present

## 2017-09-03 DIAGNOSIS — I129 Hypertensive chronic kidney disease with stage 1 through stage 4 chronic kidney disease, or unspecified chronic kidney disease: Secondary | ICD-10-CM | POA: Diagnosis present

## 2017-09-03 DIAGNOSIS — T473X5A Adverse effect of saline and osmotic laxatives, initial encounter: Secondary | ICD-10-CM | POA: Diagnosis present

## 2017-09-03 DIAGNOSIS — K219 Gastro-esophageal reflux disease without esophagitis: Secondary | ICD-10-CM | POA: Diagnosis present

## 2017-09-03 DIAGNOSIS — Z794 Long term (current) use of insulin: Secondary | ICD-10-CM | POA: Diagnosis not present

## 2017-09-03 DIAGNOSIS — L03115 Cellulitis of right lower limb: Principal | ICD-10-CM | POA: Diagnosis present

## 2017-09-03 DIAGNOSIS — K7460 Unspecified cirrhosis of liver: Secondary | ICD-10-CM | POA: Diagnosis present

## 2017-09-03 DIAGNOSIS — E1151 Type 2 diabetes mellitus with diabetic peripheral angiopathy without gangrene: Secondary | ICD-10-CM | POA: Diagnosis present

## 2017-09-03 DIAGNOSIS — I878 Other specified disorders of veins: Secondary | ICD-10-CM | POA: Diagnosis present

## 2017-09-03 DIAGNOSIS — L0390 Cellulitis, unspecified: Secondary | ICD-10-CM | POA: Diagnosis present

## 2017-09-03 DIAGNOSIS — L03116 Cellulitis of left lower limb: Secondary | ICD-10-CM | POA: Diagnosis present

## 2017-09-03 DIAGNOSIS — D631 Anemia in chronic kidney disease: Secondary | ICD-10-CM | POA: Diagnosis present

## 2017-09-03 DIAGNOSIS — Z833 Family history of diabetes mellitus: Secondary | ICD-10-CM

## 2017-09-03 DIAGNOSIS — N183 Chronic kidney disease, stage 3 (moderate): Secondary | ICD-10-CM | POA: Diagnosis present

## 2017-09-03 DIAGNOSIS — F17290 Nicotine dependence, other tobacco product, uncomplicated: Secondary | ICD-10-CM | POA: Diagnosis present

## 2017-09-03 DIAGNOSIS — E1122 Type 2 diabetes mellitus with diabetic chronic kidney disease: Secondary | ICD-10-CM | POA: Diagnosis present

## 2017-09-03 DIAGNOSIS — K521 Toxic gastroenteritis and colitis: Secondary | ICD-10-CM | POA: Diagnosis present

## 2017-09-03 DIAGNOSIS — R531 Weakness: Secondary | ICD-10-CM

## 2017-09-03 LAB — COMPREHENSIVE METABOLIC PANEL
ALK PHOS: 63 U/L (ref 38–126)
ALT: 21 U/L (ref 17–63)
AST: 44 U/L — AB (ref 15–41)
Albumin: 2.7 g/dL — ABNORMAL LOW (ref 3.5–5.0)
Anion gap: 11 (ref 5–15)
BILIRUBIN TOTAL: 0.7 mg/dL (ref 0.3–1.2)
BUN: 31 mg/dL — AB (ref 6–20)
CO2: 22 mmol/L (ref 22–32)
CREATININE: 2.36 mg/dL — AB (ref 0.61–1.24)
Calcium: 9.3 mg/dL (ref 8.9–10.3)
Chloride: 101 mmol/L (ref 101–111)
GFR calc Af Amer: 32 mL/min — ABNORMAL LOW (ref >60)
GFR, EST NON AFRICAN AMERICAN: 27 mL/min — AB (ref >60)
GLUCOSE: 273 mg/dL — AB (ref 65–99)
Potassium: 3.5 mmol/L (ref 3.5–5.1)
Sodium: 134 mmol/L — ABNORMAL LOW (ref 135–145)
TOTAL PROTEIN: 6.7 g/dL (ref 6.5–8.1)

## 2017-09-03 LAB — CBC
HEMATOCRIT: 24 % — AB (ref 40.0–52.0)
HEMOGLOBIN: 8.1 g/dL — AB (ref 13.0–18.0)
MCH: 31.5 pg (ref 26.0–34.0)
MCHC: 33.5 g/dL (ref 32.0–36.0)
MCV: 94.1 fL (ref 80.0–100.0)
Platelets: 105 10*3/uL — ABNORMAL LOW (ref 150–440)
RBC: 2.56 MIL/uL — ABNORMAL LOW (ref 4.40–5.90)
RDW: 17.2 % — ABNORMAL HIGH (ref 11.5–14.5)
WBC: 14.7 10*3/uL — AB (ref 3.8–10.6)

## 2017-09-03 LAB — URINALYSIS, COMPLETE (UACMP) WITH MICROSCOPIC
Bacteria, UA: NONE SEEN
Bilirubin Urine: NEGATIVE
GLUCOSE, UA: 50 mg/dL — AB
KETONES UR: NEGATIVE mg/dL
Leukocytes, UA: NEGATIVE
NITRITE: NEGATIVE
PROTEIN: 100 mg/dL — AB
Specific Gravity, Urine: 1.015 (ref 1.005–1.030)
pH: 5 (ref 5.0–8.0)

## 2017-09-03 LAB — GLUCOSE, CAPILLARY: GLUCOSE-CAPILLARY: 199 mg/dL — AB (ref 65–99)

## 2017-09-03 LAB — CK: Total CK: 164 U/L (ref 49–397)

## 2017-09-03 LAB — AMMONIA: Ammonia: 22 umol/L (ref 9–35)

## 2017-09-03 LAB — LIPASE, BLOOD: Lipase: 21 U/L (ref 11–51)

## 2017-09-03 MED ORDER — FUROSEMIDE 40 MG PO TABS
40.0000 mg | ORAL_TABLET | Freq: Two times a day (BID) | ORAL | Status: DC
  Administered 2017-09-04 – 2017-09-05 (×3): 40 mg via ORAL
  Filled 2017-09-03 (×3): qty 1

## 2017-09-03 MED ORDER — DEXTROSE 5 % IV SOLN
Freq: Three times a day (TID) | INTRAVENOUS | Status: DC
  Administered 2017-09-03 – 2017-09-04 (×2): via INTRAVENOUS
  Filled 2017-09-03 (×4): qty 10

## 2017-09-03 MED ORDER — RIFAXIMIN 550 MG PO TABS
550.0000 mg | ORAL_TABLET | Freq: Two times a day (BID) | ORAL | Status: DC
  Administered 2017-09-03 – 2017-09-05 (×4): 550 mg via ORAL
  Filled 2017-09-03 (×4): qty 1

## 2017-09-03 MED ORDER — ONDANSETRON HCL 4 MG PO TABS: 4.0000 mg | ORAL_TABLET | Freq: Four times a day (QID) | ORAL | Status: DC | PRN

## 2017-09-03 MED ORDER — INSULIN ASPART PROT & ASPART (70-30 MIX) 100 UNIT/ML ~~LOC~~ SUSP
50.0000 [IU] | Freq: Two times a day (BID) | SUBCUTANEOUS | Status: DC
  Administered 2017-09-04 – 2017-09-05 (×3): 50 [IU] via SUBCUTANEOUS
  Filled 2017-09-03 (×3): qty 1

## 2017-09-03 MED ORDER — LORAZEPAM 0.5 MG PO TABS
0.5000 mg | ORAL_TABLET | Freq: Three times a day (TID) | ORAL | Status: DC | PRN
Start: 2017-09-03 — End: 2017-09-05
  Administered 2017-09-04: 18:00:00 0.5 mg via ORAL
  Filled 2017-09-03: qty 1

## 2017-09-03 MED ORDER — FERROUS SULFATE 325 (65 FE) MG PO TABS
325.0000 mg | ORAL_TABLET | Freq: Two times a day (BID) | ORAL | Status: DC
  Administered 2017-09-04 – 2017-09-05 (×3): 325 mg via ORAL
  Filled 2017-09-03 (×4): qty 1

## 2017-09-03 MED ORDER — ADULT MULTIVITAMIN W/MINERALS CH
1.0000 | ORAL_TABLET | Freq: Every day | ORAL | Status: DC
  Administered 2017-09-04 – 2017-09-05 (×2): 1 via ORAL
  Filled 2017-09-03 (×2): qty 1

## 2017-09-03 MED ORDER — INSULIN ASPART 100 UNIT/ML ~~LOC~~ SOLN
0.0000 [IU] | Freq: Every day | SUBCUTANEOUS | Status: DC
  Administered 2017-09-04: 21:00:00 2 [IU] via SUBCUTANEOUS
  Filled 2017-09-03: qty 1

## 2017-09-03 MED ORDER — INSULIN ASPART 100 UNIT/ML ~~LOC~~ SOLN
0.0000 [IU] | Freq: Three times a day (TID) | SUBCUTANEOUS | Status: DC
  Administered 2017-09-04: 2 [IU] via SUBCUTANEOUS
  Administered 2017-09-04: 18:00:00 3 [IU] via SUBCUTANEOUS
  Administered 2017-09-04 – 2017-09-05 (×2): 2 [IU] via SUBCUTANEOUS
  Filled 2017-09-03 (×4): qty 1

## 2017-09-03 MED ORDER — SPIRONOLACTONE 25 MG PO TABS
25.0000 mg | ORAL_TABLET | Freq: Every day | ORAL | Status: DC
  Administered 2017-09-04 – 2017-09-05 (×2): 25 mg via ORAL
  Filled 2017-09-03 (×2): qty 1

## 2017-09-03 MED ORDER — PANTOPRAZOLE SODIUM 40 MG PO TBEC
40.0000 mg | DELAYED_RELEASE_TABLET | Freq: Every day | ORAL | Status: DC
  Administered 2017-09-04 – 2017-09-05 (×2): 40 mg via ORAL
  Filled 2017-09-03 (×2): qty 1

## 2017-09-03 MED ORDER — ONDANSETRON HCL 4 MG/2ML IJ SOLN: 4.0000 mg | Freq: Four times a day (QID) | INTRAMUSCULAR | Status: DC | PRN

## 2017-09-03 MED ORDER — METOLAZONE 2.5 MG PO TABS
2.5000 mg | ORAL_TABLET | Freq: Every day | ORAL | Status: DC
  Administered 2017-09-04 – 2017-09-05 (×2): 2.5 mg via ORAL
  Filled 2017-09-03 (×2): qty 1

## 2017-09-03 MED ORDER — CEFAZOLIN SODIUM-DEXTROSE 1-4 GM/50ML-% IV SOLN: 1.0000 g | Freq: Three times a day (TID) | INTRAVENOUS | Status: DC

## 2017-09-03 MED ORDER — ENOXAPARIN SODIUM 40 MG/0.4ML ~~LOC~~ SOLN
40.0000 mg | Freq: Two times a day (BID) | SUBCUTANEOUS | Status: DC
  Administered 2017-09-03 – 2017-09-05 (×4): 40 mg via SUBCUTANEOUS
  Filled 2017-09-03 (×4): qty 0.4

## 2017-09-03 MED ORDER — NADOLOL 40 MG PO TABS
40.0000 mg | ORAL_TABLET | Freq: Every day | ORAL | Status: DC
  Administered 2017-09-04 – 2017-09-05 (×2): 40 mg via ORAL
  Filled 2017-09-03 (×2): qty 1

## 2017-09-03 MED ORDER — RISAQUAD PO CAPS
2.0000 | ORAL_CAPSULE | Freq: Three times a day (TID) | ORAL | Status: DC
  Administered 2017-09-03 – 2017-09-05 (×5): 2 via ORAL
  Filled 2017-09-03 (×10): qty 2

## 2017-09-03 MED ORDER — LACTULOSE 10 GM/15ML PO SOLN: 30.0000 g | Freq: Every day | ORAL | Status: DC

## 2017-09-03 MED ORDER — VANCOMYCIN HCL IN DEXTROSE 1-5 GM/200ML-% IV SOLN
1000.0000 mg | Freq: Once | INTRAVENOUS | Status: AC
  Administered 2017-09-03: 1000 mg via INTRAVENOUS
  Filled 2017-09-03: qty 200

## 2017-09-03 NOTE — Progress Notes (Signed)
Anticoagulation monitoring(Lovenox):  66 yo male ordered Lovenox 30 mg Q24h  Filed Weights   09/03/17 1342  Weight: (!) 315 lb (142.9 kg)   BMI 46.5    Lab Results  Component Value Date   CREATININE 2.36 (H) 09/03/2017   CREATININE 1.85 (H) 06/27/2017   CREATININE 1.98 (H) 06/12/2017   Estimated Creatinine Clearance: 44 mL/min (A) (by C-G formula based on SCr of 2.36 mg/dL (H)). Hemoglobin & Hematocrit     Component Value Date/Time   HGB 8.1 (L) 09/03/2017 1345   HGB 9.7 (L) 09/14/2014 0356   HCT 24.0 (L) 09/03/2017 1345   HCT 28.2 (L) 09/14/2014 0356     Per Protocol for Patient with estCrcl > 30 ml/min and BMI  ]> 40, will transition to Lovenox 40 mg Q12h.

## 2017-09-03 NOTE — H&P (Signed)
[facility] NAME: Corey West    MR#:  [id number]  DATE OF BIRTH:  1950-12-22  DATE OF ADMISSION:  09/03/2017  PRIMARY CARE PHYSICIAN: Baxter Hire, MD   REQUESTING/REFERRING PHYSICIAN: Harvest Dark, MD  CHIEF COMPLAINT:  Diarrhea and redness of the legs  HISTORY OF PRESENT ILLNESS:  Corey West  is a 66 y.o. male with a known history of liver cirrhosis, on lactulose, esophageal varices, GERD, hypertension, is requiring diabetes mellitus is presenting to the ED with a chief complaint of generalized weakness. EMS found him covered with significant stool. Patient used antibiotics for right neck abscess 3 weeks ago  Which has beenalmost resolved. White blood cell count is elevated. Patient denies any fever or cough or congestion. Reporting worsening of the lower extremity redness. PAST MEDICAL HISTORY:   Past Medical History:  Diagnosis Date  . Anemia   . Anxiety    controlled;   . Arthritis   . AVM (arteriovenous malformation) of stomach, acquired with hemorrhage   . Barrett's esophagus   . Chronic kidney disease    renal infufficiency  . Cirrhosis ([place])   . Depression    controlled;   Marland Kitchen Diabetes mellitus without complication ([place])    not controlled, taking insulin but sugar continues to run high;   . Esophageal varices ([place])   . GERD (gastroesophageal reflux disease)   . History of hiatal hernia   . Hypertension    controlled well;   . Polyp, stomach    with chronic blood loss  . Sleep apnea    does not wear a cpap, Medicare would not pay for it   . Venous stasis of both lower extremities    with cellulitis    PAST SURGICAL HISTOIRY:   Past Surgical History:  Procedure Laterality Date  . ESOPHAGOGASTRODUODENOSCOPY N/A 02/09/2015   Procedure: ESOPHAGOGASTRODUODENOSCOPY (EGD);  Surgeon: Manya Silvas, MD;  Location: [facility] ENDOSCOPY;  Service: Endoscopy;  Laterality: N/A;  . ESOPHAGOGASTRODUODENOSCOPY  N/A 07/22/2015   Procedure: ESOPHAGOGASTRODUODENOSCOPY (EGD);  Surgeon: Manya Silvas, MD;  Location: [facility] ENDOSCOPY;  Service: Endoscopy;  Laterality: N/A;  . ESOPHAGOGASTRODUODENOSCOPY N/A 06/26/2017   Procedure: ESOPHAGOGASTRODUODENOSCOPY (EGD);  Surgeon: Manya Silvas, MD;  Location: [facility] ENDOSCOPY;  Service: Endoscopy;  Laterality: N/A;  . ESOPHAGOGASTRODUODENOSCOPY N/A 06/27/2017   Procedure: ESOPHAGOGASTRODUODENOSCOPY (EGD);  Surgeon: Manya Silvas, MD;  Location: [facility] ENDOSCOPY;  Service: Endoscopy;  Laterality: N/A;  . ESOPHAGOGASTRODUODENOSCOPY N/A 06/28/2017   Procedure: ESOPHAGOGASTRODUODENOSCOPY (EGD);  Surgeon: Manya Silvas, MD;  Location: [facility] ENDOSCOPY;  Service: Endoscopy;  Laterality: N/A;  . ESOPHAGOGASTRODUODENOSCOPY (EGD) WITH PROPOFOL N/A 07/20/2015   Procedure: ESOPHAGOGASTRODUODENOSCOPY (EGD) WITH PROPOFOL;  Surgeon: Manya Silvas, MD;  Location: [facility] ENDOSCOPY;  Service: Endoscopy;  Laterality: N/A;  . ESOPHAGOGASTRODUODENOSCOPY (EGD) WITH PROPOFOL N/A 09/16/2015   Procedure: ESOPHAGOGASTRODUODENOSCOPY (EGD) WITH PROPOFOL;  Surgeon: Manya Silvas, MD;  Location: [facility] ENDOSCOPY;  Service: Endoscopy;  Laterality: N/A;  . ESOPHAGOGASTRODUODENOSCOPY (EGD) WITH PROPOFOL N/A 03/16/2016   Procedure: ESOPHAGOGASTRODUODENOSCOPY (EGD) WITH PROPOFOL;  Surgeon: Manya Silvas, MD;  Location: [facility] ENDOSCOPY;  Service: Endoscopy;  Laterality: N/A;  . ESOPHAGOGASTRODUODENOSCOPY (EGD) WITH PROPOFOL N/A 09/14/2016   Procedure: ESOPHAGOGASTRODUODENOSCOPY (EGD) WITH PROPOFOL;  Surgeon: Manya Silvas, MD;  Location: [facility] ENDOSCOPY;  Service: Endoscopy;  Laterality: N/A;  . ESOPHAGOGASTRODUODENOSCOPY (EGD) WITH PROPOFOL N/A 11/05/2016   Procedure: ESOPHAGOGASTRODUODENOSCOPY (EGD) WITH PROPOFOL;  Surgeon: Manya Silvas, MD;  Location: [facility] ENDOSCOPY;  Service:  Endoscopy;  Laterality: N/A;  . ESOPHAGOGASTRODUODENOSCOPY (EGD) WITH PROPOFOL N/A 02/06/2017   Procedure:  ESOPHAGOGASTRODUODENOSCOPY (EGD) WITH PROPOFOL;  Surgeon: Manya Silvas, MD;  Location: [facility] ENDOSCOPY;  Service: Endoscopy;  Laterality: N/A;  . ESOPHAGOGASTRODUODENOSCOPY (EGD) WITH PROPOFOL N/A 04/24/2017   Procedure: ESOPHAGOGASTRODUODENOSCOPY (EGD) WITH PROPOFOL;  Surgeon: Manya Silvas, MD;  Location: [facility] ENDOSCOPY;  Service: Endoscopy;  Laterality: N/A;  . ESOPHAGOGASTRODUODENOSCOPY (EGD) WITH PROPOFOL N/A 07/31/2017   Procedure: ESOPHAGOGASTRODUODENOSCOPY (EGD) WITH PROPOFOL;  Surgeon: Manya Silvas, MD;  Location: [facility] ENDOSCOPY;  Service: Endoscopy;  Laterality: N/A;  . TONSILLECTOMY    . TONSILLECTOMY AND ADENOIDECTOMY    . ULNAR NERVE TRANSPOSITION    . UVULOPALATOPHARYNGOPLASTY      SOCIAL HISTORY:   Social History   Tobacco Use  . Smoking status: Current Some Day Smoker    Types: Cigars  . Smokeless tobacco: Never Used  . Tobacco comment: smoke 1 at night occasionally;   Substance Use Topics  . Alcohol use: No    Comment: stopped 12 years ago    FAMILY HISTORY:   Family History  Problem Relation Age of Onset  . Diabetes Other     DRUG ALLERGIES:  No Known Allergies  REVIEW OF SYSTEMS:  CONSTITUTIONAL: No fever, fatigue or weakness.  EYES: No blurred or double vision.  EARS, NOSE, AND THROAT: No tinnitus or ear pain.  RESPIRATORY: No cough, shortness of breath, wheezing or hemoptysis.  CARDIOVASCULAR: No chest pain, orthopnea, edema.  GASTROINTESTINAL: No nausea, vomiting, patient is reporting diarrhea , denies abdominal pain.  GENITOURINARY: No dysuria, hematuria.  ENDOCRINE: No polyuria, nocturia,  HEMATOLOGY: No anemia, easy bruising or bleeding SKIN: No rash or lesion. MUSCULOSKELETAL: Reporting chronic swelling and circulation problem in the lower extremities and more redness and swelling lately  NEUROLOGIC: No tingling, numbness, weakness.  PSYCHIATRY: No anxiety or depression.   MEDICATIONS AT HOME:   Prior to Admission medications    Medication Sig Start Date End Date Taking? Authorizing Provider  FERROUS SULFATE ER PO Take 325 mg by mouth 2 (two) times daily.    Yes [provider]  furosemide (LASIX) 40 MG tablet Take 40 mg by mouth 2 (two) times daily.   Yes [provider]  insulin aspart protamine- aspart (NOVOLOG MIX 70/30) (70-30) 100 UNIT/ML injection Inject 0.5 mLs (50 Units total) into the skin 2 (two) times daily with a meal. Pt uses 84 units with breakfast and 62 units with supper. Patient taking differently: Inject 65-84 Units into the skin 2 (two) times daily with a meal. 84-AM 65-PM 02/22/17  Yes Fritzi Mandes, MD  lactulose (CHRONULAC) 10 GM/15ML solution Take 30 g by mouth at bedtime.    Yes [provider]  LORazepam (ATIVAN) 0.5 MG tablet Take 0.5 mg by mouth 3 (three) times daily as needed for anxiety.    Yes [provider]  metolazone (ZAROXOLYN) 2.5 MG tablet Take 2.5 mg by mouth daily.   Yes [provider]  Multiple Vitamin (MULTIVITAMIN) tablet Take 1 tablet by mouth daily.   Yes [provider]  nadolol (CORGARD) 40 MG tablet Take 40 mg by mouth daily.   Yes [provider]  omeprazole (PRILOSEC) 40 MG capsule Take 40 mg by mouth 2 (two) times daily.   Yes [provider]  potassium chloride SA (K-DUR,KLOR-CON) 20 MEQ tablet Take 1 tablet by mouth daily. 08/19/17 11/17/17 Yes [provider]  rifaximin (XIFAXAN) 550 MG TABS tablet Take 550 mg by mouth  2 (two) times daily.   Yes [provider]  spironolactone (ALDACTONE) 25 MG tablet Take 25 mg by mouth daily.   Yes [provider]  ammonium lactate (AMLACTIN) 12 % cream Apply topically 2 (two) times daily.    [provider]      VITAL SIGNS:  Blood pressure (!) 131/52, pulse 79, temperature 98.5 F (36.9 C), temperature source Oral, resp. rate (!) 31, height 5\' 9"  (1.753 m), weight (!) 142.9 kg (315 lb), SpO2 98 %.  PHYSICAL EXAMINATION:   GENERAL:  66 y.o.-year-old patient lying in the bed with no acute distress.  EYES: Pupils equal, round, reactive to light and accommodation. No scleral icterus. Extraocular muscles intact.  HEENT: Head atraumatic, normocephalic. Oropharynx and nasopharynx clear.  NECK:  Supple, no jugular venous distention. No thyroid enlargement, no tenderness.  LUNGS: Normal breath sounds bilaterally, no wheezing, rales,rhonchi or crepitation. No use of accessory muscles of respiration.  CARDIOVASCULAR: S1, S2 normal. No murmurs, rubs, or gallops.  ABDOMEN: Soft, nontender, nondistended. Bowel sounds present. No organomegaly or mass.  EXTREMITIES: Chronic venous changes with bilateral lower extremity erythema more on the right extremity than  Left. No  cyanosis, or clubbing.  NEUROLOGIC: Cranial nerves II through XII are intact. Muscle strength 5/5 in all extremities. Sensation intact. Gait not checked.  PSYCHIATRIC: The patient is alert and oriented x 3.  SKIN: No obvious rash, lesion, or ulcer.   LABORATORY PANEL:   CBC Recent Labs  Lab 09/03/17 1345  WBC 14.7*  HGB 8.1*  HCT 24.0*  PLT 105*   ------------------------------------------------------------------------------------------------------------------  Chemistries  Recent Labs  Lab 09/03/17 1345  NA 134*  K 3.5  CL 101  CO2 22  GLUCOSE 273*  BUN 31*  CREATININE 2.36*  CALCIUM 9.3  AST 44*  ALT 21  ALKPHOS 63  BILITOT 0.7   ------------------------------------------------------------------------------------------------------------------  Cardiac Enzymes No results for input(s): TROPONINI in the last 168 hours. ------------------------------------------------------------------------------------------------------------------  RADIOLOGY:  US Venous Img Lower Unilateral Right  Result Date: 09/03/2017 CLINICAL DATA:  Right leg pain and swelling EXAM: RIGHT LOWER EXTREMITY VENOUS DOPPLER ULTRASOUND TECHNIQUE: Gray-scale  sonography with graded compression, as well as color Doppler and duplex ultrasound were performed to evaluate the lower extremity deep venous systems from the level of the common femoral vein and including the common femoral, femoral, profunda femoral, popliteal and calf veins including the posterior tibial, peroneal and gastrocnemius veins when visible. The superficial great saphenous vein was also interrogated. Spectral Doppler was utilized to evaluate flow at rest and with distal augmentation maneuvers in the common femoral, femoral and popliteal veins. COMPARISON:  06/22/2014 FINDINGS: Contralateral Common Femoral Vein: Respiratory phasicity is normal and symmetric with the symptomatic side. No evidence of thrombus. Normal compressibility. Common Femoral Vein: No evidence of thrombus. Normal compressibility, respiratory phasicity and response to augmentation. Saphenofemoral Junction: No evidence of thrombus. Normal compressibility and flow on color Doppler imaging. Profunda Femoral Vein: No evidence of thrombus. Normal compressibility and flow on color Doppler imaging. Femoral Vein: No evidence of thrombus. Normal compressibility, respiratory phasicity and response to augmentation. Popliteal Vein: No evidence of thrombus. Normal compressibility, respiratory phasicity and response to augmentation. Calf Veins: No evidence of thrombus. Normal compressibility and flow on color Doppler imaging. Superficial Great Saphenous Vein: No evidence of thrombus. Normal compressibility. Venous Reflux:  None. Other Findings:  None. IMPRESSION: No evidence of deep venous thrombosis. Electronically Signed   By: Inez Catalina M.D.   On: 09/03/2017 18:46    EKG:  Orders placed or performed during the hospital encounter of 09/03/17  . ED EKG  . ED EKG  . EKG 12-Lead  . EKG 12-Lead    IMPRESSION AND PLAN:   Corey West  is a 66 y.o. male with a known history of liver cirrhosis, on lactulose, esophageal varices, GERD,  hypertension, is requiring diabetes mellitus is presenting to the ED with a chief complaint of generalized weakness. EMS found him covered with significant stool. Patient used antibiotics for right neck abscess 3 weeks ago  Which has beenalmost resolved. White blood cell count is elevated  # Bilateral lower extending to cellulitis right greater than left Admitted to MedSurg unit IV cefepime and vancomycin with probiotics Follow-up on the blood and urine cultures DVT ruled out with negative venous Dopplers   #Diarrhea Stool for C. difficile toxin and GI panel are ordered in the ED which are pending  #Insulin requiring diabetes mellitus Continue NovoLog 70/30 50 units twice a day Sliding scale insulin Check hemoglobin A1c  #Acute kidney injury on chronic kidney disease Hydrate with IV fluids Avoid nephrotoxins Monitor renal function closely   #Peripheral vascular disease Continue wound care and outpatient follow-up with the wound clinic  #History of chronic liver cirrhosis Continue lactulose,XIFIXAN, Lasix and Zaroxolyn  DVT prophylaxis with Lovenox subcutaneous    All the records are reviewed and case discussed with ED provider. Management plans discussed with the patient, family and they are in agreement.  CODE STATUS: FC , Niece - Angel [email or web address]  TOTAL TIME TAKING CARE OF THIS PATIENT: 43  minutes.   Note: This dictation was prepared with Dragon dictation along with smaller phrase technology. Any transcriptional errors that result from this process are unintentional.  [name] on 09/03/2017 at 9:00 PM  Between 7am to 6pm - Pager - [phone]  After 6pm go to [email or web address] - password [email or web address] [facility]  Office  [phone]  CC: Primary care physician; Baxter Hire, MD

## 2017-09-03 NOTE — ED Notes (Signed)
Report off to sherrie rn

## 2017-09-03 NOTE — ED Notes (Signed)
Patient transported to Ultrasound 

## 2017-09-03 NOTE — ED Notes (Signed)
Pt skin cleaned of stool. Cleaned back, buttocks, legs, and feet with warm wipes. Buttocks red, no skin breakdown noted. Pt states he has been sitting on his bottom for a while. All pt clothes placed in pt belongings bag, they were all soiled. Chucks placed under pt, gown placed on pt. Pt given urinal in case he needs to urinate.

## 2017-09-03 NOTE — ED Provider Notes (Signed)
[facility] Emergency Department Provider Note  Time seen: 3:52 PM  I have reviewed the triage vital signs and the nursing notes.   HISTORY  Chief Complaint Diarrhea    HPI Corey West is a 66 y.o. 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  According to the patient since yesterday he has been feeling extremely confused, states this morning he did not know where he was, apparently had defecated on himself throughout the night.  EMS state upon arrival the patient was covered in significant stool over his body.  Patient states he has been taking his lactulose, always has some degree of diarrhea.  He also states approximately 3 weeks ago he went on antibiotics for almost 2 weeks for a right-sided neck abscess which has since largely resolved.  Patient denies any known fever, cough or congestion.  Denies abdominal pain or vomiting.  Patient is currently oriented x4 although his responses are quite slowed.   Past Medical History:  Diagnosis Date  . Anemia   . Anxiety    controlled;   . Arthritis   . AVM (arteriovenous malformation) of stomach, acquired with hemorrhage   . Barrett's esophagus   . Chronic kidney disease    renal infufficiency  . Cirrhosis ([place])   . Depression    controlled;   Marland Kitchen Diabetes mellitus without complication ([place])    not controlled, taking insulin but sugar continues to run high;   . Esophageal varices ([place])   . GERD (gastroesophageal reflux disease)   . History of hiatal hernia   . Hypertension    controlled well;   . Polyp, stomach    with chronic blood loss  . Sleep apnea    does not wear a cpap, Medicare would not pay for it   . Venous stasis of both lower extremities    with cellulitis    Patient Active Problem List   Diagnosis Date Noted  .  Symptomatic anemia 06/26/2017  . Sepsis ([place]) 06/12/2017  . Cellulitis in diabetic foot ([place]) 02/21/2017  . Uncontrolled diabetes mellitus ([place]) 02/21/2017  . Cellulitis 02/21/2017  . GI bleed 07/20/2015  . Hematemesis 02/08/2015  . Cirrhosis ([place]) 02/08/2015  . Esophageal varices ([place]) 02/08/2015  . Type 2 diabetes mellitus ([place]) 02/08/2015  . Gastroesophageal reflux disease 02/08/2015  . Hyperlipidemia 02/08/2015  . Anemia 12/04/2013  . Thrombocytopenia ([place]) 12/04/2013  . Splenomegaly 11/04/2013    Past Surgical History:  Procedure Laterality Date  . ESOPHAGOGASTRODUODENOSCOPY N/A 02/09/2015   Procedure: ESOPHAGOGASTRODUODENOSCOPY (EGD);  Surgeon: Manya Silvas, MD;  Location: [facility] ENDOSCOPY;  Service: Endoscopy;  Laterality: N/A;  . ESOPHAGOGASTRODUODENOSCOPY N/A 07/22/2015   Procedure: ESOPHAGOGASTRODUODENOSCOPY (EGD);  Surgeon: Manya Silvas, MD;  Location: [facility] ENDOSCOPY;  Service: Endoscopy;  Laterality: N/A;  . ESOPHAGOGASTRODUODENOSCOPY N/A 06/26/2017   Procedure: ESOPHAGOGASTRODUODENOSCOPY (EGD);  Surgeon: Manya Silvas, MD;  Location: [facility] ENDOSCOPY;  Service: Endoscopy;  Laterality: N/A;  . ESOPHAGOGASTRODUODENOSCOPY N/A 06/27/2017   Procedure: ESOPHAGOGASTRODUODENOSCOPY (EGD);  Surgeon: Manya Silvas, MD;  Location: [facility] ENDOSCOPY;  Service: Endoscopy;  Laterality: N/A;  . ESOPHAGOGASTRODUODENOSCOPY N/A 06/28/2017   Procedure: ESOPHAGOGASTRODUODENOSCOPY (EGD);  Surgeon: Manya Silvas, MD;  Location: [facility] ENDOSCOPY;  Service: Endoscopy;  Laterality: N/A;  . ESOPHAGOGASTRODUODENOSCOPY (EGD) WITH PROPOFOL N/A 07/20/2015   Procedure: ESOPHAGOGASTRODUODENOSCOPY (EGD) WITH PROPOFOL;  Surgeon: Manya Silvas, MD;  Location:  [facility] ENDOSCOPY;  Service: Endoscopy;  Laterality: N/A;  . ESOPHAGOGASTRODUODENOSCOPY (EGD) WITH PROPOFOL N/A 09/16/2015   Procedure: ESOPHAGOGASTRODUODENOSCOPY (EGD) WITH PROPOFOL;  Surgeon: Manya Silvas, MD;  Location: [facility] ENDOSCOPY;   Service: Endoscopy;  Laterality: N/A;  . ESOPHAGOGASTRODUODENOSCOPY (EGD) WITH PROPOFOL N/A 03/16/2016   Procedure: ESOPHAGOGASTRODUODENOSCOPY (EGD) WITH PROPOFOL;  Surgeon: Manya Silvas, MD;  Location: [facility] ENDOSCOPY;  Service: Endoscopy;  Laterality: N/A;  . ESOPHAGOGASTRODUODENOSCOPY (EGD) WITH PROPOFOL N/A 09/14/2016   Procedure: ESOPHAGOGASTRODUODENOSCOPY (EGD) WITH PROPOFOL;  Surgeon: Manya Silvas, MD;  Location: [facility] ENDOSCOPY;  Service: Endoscopy;  Laterality: N/A;  . ESOPHAGOGASTRODUODENOSCOPY (EGD) WITH PROPOFOL N/A 11/05/2016   Procedure: ESOPHAGOGASTRODUODENOSCOPY (EGD) WITH PROPOFOL;  Surgeon: Manya Silvas, MD;  Location: [facility] ENDOSCOPY;  Service: Endoscopy;  Laterality: N/A;  . ESOPHAGOGASTRODUODENOSCOPY (EGD) WITH PROPOFOL N/A 02/06/2017   Procedure: ESOPHAGOGASTRODUODENOSCOPY (EGD) WITH PROPOFOL;  Surgeon: Manya Silvas, MD;  Location: [facility] ENDOSCOPY;  Service: Endoscopy;  Laterality: N/A;  . ESOPHAGOGASTRODUODENOSCOPY (EGD) WITH PROPOFOL N/A 04/24/2017   Procedure: ESOPHAGOGASTRODUODENOSCOPY (EGD) WITH PROPOFOL;  Surgeon: Manya Silvas, MD;  Location: [facility] ENDOSCOPY;  Service: Endoscopy;  Laterality: N/A;  . ESOPHAGOGASTRODUODENOSCOPY (EGD) WITH PROPOFOL N/A 07/31/2017   Procedure: ESOPHAGOGASTRODUODENOSCOPY (EGD) WITH PROPOFOL;  Surgeon: Manya Silvas, MD;  Location: [facility] ENDOSCOPY;  Service: Endoscopy;  Laterality: N/A;  . TONSILLECTOMY    . TONSILLECTOMY AND ADENOIDECTOMY    . ULNAR NERVE TRANSPOSITION    . UVULOPALATOPHARYNGOPLASTY      Prior to Admission medications   Medication Sig Start Date End Date Taking? Authorizing Provider  ammonium lactate (AMLACTIN) 12 % cream Apply topically 2 (two) times daily.    [provider]  Ferrous Sulfate 140 (45 FE) MG TBCR Take 140 mg by mouth daily.     [provider]  furosemide (LASIX) 40 MG tablet Take 40 mg by mouth 2 (two) times daily.    [provider]  insulin aspart protamine-  aspart (NOVOLOG MIX 70/30) (70-30) 100 UNIT/ML injection Inject 0.5 mLs (50 Units total) into the skin 2 (two) times daily with a meal. Pt uses 84 units with breakfast and 62 units with supper. Patient taking differently: Inject 65-84 Units into the skin 2 (two) times daily with a meal. Pt uses 84 units with breakfast and 62 units with supper.  02/22/17   Fritzi Mandes, MD  lactulose (CHRONULAC) 10 GM/15ML solution Take 40 g by mouth at bedtime.     [provider]  LORazepam (ATIVAN) 0.5 MG tablet Take 0.5 mg by mouth 3 (three) times daily as needed for anxiety.     [provider]  omeprazole (PRILOSEC) 40 MG capsule Take 40 mg by mouth 2 (two) times daily.    [provider]  rifaximin (XIFAXAN) 550 MG TABS tablet Take 550 mg by mouth 2 (two) times daily.    [provider]  spironolactone (ALDACTONE) 25 MG tablet Take 25 mg by mouth daily.    [provider]  sucralfate (CARAFATE) 1 g tablet Take 1 g by mouth 3 (three) times daily.     [provider]  traMADol (ULTRAM) 50 MG tablet Take 50 mg by mouth every 8 (eight) hours as needed for moderate pain.     [provider]    No Known Allergies  Family History  Problem Relation Age of Onset  . Diabetes Other     Social History Social History   Tobacco Use  . Smoking status: Current Some Day Smoker  Types: Cigars  . Smokeless tobacco: Never Used  . Tobacco comment: smoke 1 at night occasionally;   Substance Use Topics  . Alcohol use: No    Comment: stopped 12 years ago  . Drug use: No    Review of Systems Constitutional: Negative for fever.  Positive for generalized weakness/fatigue Cardiovascular: Negative for chest pain. Respiratory: Negative for shortness of breath. Gastrointestinal: Negative for abdominal pain.  Positive for diarrhea which is largely chronic Neurological: Negative for headache All other ROS  negative  ____________________________________________   PHYSICAL EXAM:  VITAL SIGNS: ED Triage Vitals  Enc Vitals Group     BP 09/03/17 1341 (!) 158/55     Pulse Rate 09/03/17 1341 80     Resp 09/03/17 1341 19     Temp 09/03/17 1341 98.5 F (36.9 C)     Temp Source 09/03/17 1341 Oral     SpO2 09/03/17 1341 100 %     Weight 09/03/17 1342 (!) 315 lb (142.9 kg)     Height 09/03/17 1342 5\' 9"  (1.753 m)     Head Circumference --      Peak Flow --      Pain Score 09/03/17 1341 0     Pain Loc --      Pain Edu? --      Excl. in [place]? --    Constitutional: Alert and oriented although with slowed responses.  No distress Eyes: Normal exam ENT   Head: Normocephalic and atraumatic.   Mouth/Throat: Mucous membranes are moist. Cardiovascular: Normal rate, regular rhythm.  Respiratory: Normal respiratory effort without tachypnea nor retractions. Breath sounds are clear  Gastrointestinal: Soft and nontender. No distention.   Musculoskeletal: Significant lower extremity edema which the patient states is chronic.  Patient does have moderate right calf tenderness to palpation. Neurologic:  Normal speech and language although with mild slowing of responses. No gross focal neurologic deficits Skin:  Skin is warm, dry and intact.  Psychiatric: Mood and affect are normal.   ____________________________________________    EKG  EKG reviewed and interpreted by myself shows normal sinus rhythm 81 bpm with a narrow QRS, normal axis, normal intervals with no significant ST changes.  ____________________________________________    RADIOLOGY  Ultrasound negative for DVT  ____________________________________________   INITIAL IMPRESSION / ASSESSMENT AND PLAN / ED COURSE  Pertinent labs & imaging results that were available during my care of the patient were reviewed by me and considered in my medical decision making (see chart for details).  Patient presents to the emergency  department for generalized fatigue weakness confusion and diarrhea.  Differential would include an elevated ammonia/hepatic encephalopathy, metabolic abnormality, dehydration, anemia, infectious etiology.  We will check labs and closely monitor in the emergency department.  Patient does have right greater than left lower extremity edema with tenderness of the right leg and thigh.  Will obtain an ultrasound to further evaluate.  Patient's labs show slight renal insufficiency compared to baseline, hemoglobin is at baseline.  CK is normal.  We will obtain an ammonia level, urinalysis, continue to closely monitor.  Patient does live alone, and feels in his current state he cannot care for himself.  Ammonia level is normal.  Patient's white blood cell count is elevated at 14,000.  Patient's ultrasound the right leg is negative but given the increased erythema warmth and tenderness of this leg I highly suspect a degree of cellulitis.  We will start the patient on antibiotics stool studies are pending.  Given the patient's  diffuse weakness confusion/altered mental status and likely cellulitis we will admit to the hospitalist service for further treatment.  ____________________________________________   FINAL CLINICAL IMPRESSION(S) / ED DIAGNOSES  Weakness Confusion Right lower externally cellulitis   Harvest Dark, MD 09/03/17 1907

## 2017-09-03 NOTE — ED Notes (Signed)
Admitting MD in with pt    

## 2017-09-03 NOTE — ED Triage Notes (Signed)
Pt arrives via [facility] from home where he lives alone. Pt states he has an area that appears like an abscess to R neck that he took 10 days of antibiotics for. EMS states he then took another 5 days of antibiotics. Pt states he feels confused. Pt states finished antibiotics " a week or two ago." states diarrhea started last night. Pt uses a cane. Pt arrives covered from head to toe with dried stool. Pt had bilat lower legs wrapped, wraps cut off. States legs are wrapped once a week by wound center. Pt states he drives there. Pt states he feels like he can't take care of himself at home currently d/t weakness.  Hx DM, HTN. CBG 357. States he took medicine at [place] with EMS 99.5 oral. Alert and oriented. Answering all questions, moving all extremities. Ambulated from EMS to ED stretcher.

## 2017-09-03 NOTE — ED Notes (Signed)
Resumed care from [place] rn.  Pt alert.  Pt reports he has been confused today and having diarrhea x 3.  Pt reports living by himself and having a hard time taking care of himself.  No abd pan.  No vomiting. nsr on monitor.

## 2017-09-04 LAB — COMPREHENSIVE METABOLIC PANEL
ALBUMIN: 2.4 g/dL — AB (ref 3.5–5.0)
ALT: 19 U/L (ref 17–63)
AST: 26 U/L (ref 15–41)
Alkaline Phosphatase: 56 U/L (ref 38–126)
Anion gap: 6 (ref 5–15)
BUN: 32 mg/dL — AB (ref 6–20)
CHLORIDE: 104 mmol/L (ref 101–111)
CO2: 24 mmol/L (ref 22–32)
CREATININE: 1.98 mg/dL — AB (ref 0.61–1.24)
Calcium: 8.9 mg/dL (ref 8.9–10.3)
GFR calc Af Amer: 39 mL/min — ABNORMAL LOW (ref >60)
GFR, EST NON AFRICAN AMERICAN: 34 mL/min — AB (ref >60)
GLUCOSE: 168 mg/dL — AB (ref 65–99)
Potassium: 3.5 mmol/L (ref 3.5–5.1)
Sodium: 134 mmol/L — ABNORMAL LOW (ref 135–145)
Total Bilirubin: 0.6 mg/dL (ref 0.3–1.2)
Total Protein: 5.9 g/dL — ABNORMAL LOW (ref 6.5–8.1)

## 2017-09-04 LAB — HEMOGLOBIN A1C
Hgb A1c MFr Bld: 7 % — ABNORMAL HIGH (ref 4.8–5.6)
Mean Plasma Glucose: 154.2 mg/dL

## 2017-09-04 LAB — CBC
HCT: 21.6 % — ABNORMAL LOW (ref 40.0–52.0)
Hemoglobin: 7.3 g/dL — ABNORMAL LOW (ref 13.0–18.0)
MCH: 31.6 pg (ref 26.0–34.0)
MCHC: 33.9 g/dL (ref 32.0–36.0)
MCV: 93.3 fL (ref 80.0–100.0)
PLATELETS: 92 10*3/uL — AB (ref 150–440)
RBC: 2.32 MIL/uL — AB (ref 4.40–5.90)
RDW: 17.1 % — ABNORMAL HIGH (ref 11.5–14.5)
WBC: 10.3 10*3/uL (ref 3.8–10.6)

## 2017-09-04 LAB — GLUCOSE, CAPILLARY
GLUCOSE-CAPILLARY: 164 mg/dL — AB (ref 65–99)
Glucose-Capillary: 188 mg/dL — ABNORMAL HIGH (ref 65–99)
Glucose-Capillary: 229 mg/dL — ABNORMAL HIGH (ref 65–99)
Glucose-Capillary: 204 mg/dL — ABNORMAL HIGH (ref 65–99)

## 2017-09-04 MED ORDER — DEXTROSE 5 % IV SOLN
2.0000 g | Freq: Three times a day (TID) | INTRAVENOUS | Status: DC
  Administered 2017-09-04 – 2017-09-05 (×3): 2 g via INTRAVENOUS
  Filled 2017-09-04 (×5): qty 2000

## 2017-09-04 MED ORDER — LORAZEPAM 0.5 MG PO TABS
0.5000 mg | ORAL_TABLET | Freq: Three times a day (TID) | ORAL | Status: DC
  Administered 2017-09-04 – 2017-09-05 (×3): 0.5 mg via ORAL
  Filled 2017-09-04 (×3): qty 1

## 2017-09-04 MED ORDER — GERHARDT'S BUTT CREAM
TOPICAL_CREAM | Freq: Two times a day (BID) | CUTANEOUS | Status: DC
  Administered 2017-09-04 – 2017-09-05 (×3): via TOPICAL
  Filled 2017-09-04: qty 1

## 2017-09-04 NOTE — Plan of Care (Signed)
  Progressing Education: Knowledge of General Education information will improve 09/04/2017 1246 - Progressing by Rowe Robert, RN Clinical Measurements: Ability to maintain clinical measurements within normal limits will improve 09/04/2017 1246 - Progressing by Rowe Robert, RN Activity: Risk for activity intolerance will decrease 09/04/2017 1246 - Progressing by Rowe Robert, RN Nutrition: Adequate nutrition will be maintained 09/04/2017 1246 - Progressing by Rowe Robert, RN Skin Integrity: Skin integrity will improve 09/04/2017 1246 - Progressing by Rowe Robert, RN Note Bilateral lower legs  cleansed  and wrapped with unaboots per karen sanders rn wound nurse

## 2017-09-04 NOTE — Progress Notes (Signed)
[place] at [place] NAME: Dreux Mcgroarty    MR#:  [id number]  DATE OF BIRTH:  Nov 21, 1950  SUBJECTIVE:  CHIEF COMPLAINT:   Chief Complaint  Patient presents with  . Diarrhea   Sitting in a chair No diarrhea today Minimal pain legs  REVIEW OF SYSTEMS:    Review of Systems  Constitutional: Positive for malaise/fatigue. Negative for chills and fever.  HENT: Negative for sore throat.   Eyes: Negative for blurred vision, double vision and pain.  Respiratory: Negative for cough, hemoptysis, shortness of breath and wheezing.   Cardiovascular: Negative for chest pain, palpitations, orthopnea and leg swelling.  Gastrointestinal: Negative for abdominal pain, constipation, diarrhea, heartburn, nausea and vomiting.  Genitourinary: Negative for dysuria and hematuria.  Musculoskeletal: Negative for back pain and joint pain.  Skin: Negative for rash.  Neurological: Negative for sensory change, speech change, focal weakness and headaches.  Endo/Heme/Allergies: Does not bruise/bleed easily.  Psychiatric/Behavioral: Negative for depression. The patient is not nervous/anxious.     DRUG ALLERGIES:  No Known Allergies  VITALS:  Blood pressure (!) 107/44, pulse (!) 56, temperature 97.9 F (36.6 C), temperature source Oral, resp. rate 20, height 5\' 9"  (1.753 m), weight (!) 140.1 kg (308 lb 14.4 oz), SpO2 100 %.  PHYSICAL EXAMINATION:   Physical Exam  GENERAL:  66 y.o.-year-old patient lying in the bed with no acute distress. Obese  EYES: Pupils equal, round, reactive to light and accommodation. No scleral icterus. Extraocular muscles intact.  HEENT: Head atraumatic, normocephalic. Oropharynx and nasopharynx clear.  NECK:  Supple, no jugular venous distention. No thyroid enlargement, no tenderness.  LUNGS: Normal breath sounds bilaterally, no wheezing, rales, rhonchi. No use of accessory muscles of respiration.  CARDIOVASCULAR: S1, S2 normal. No murmurs,  rubs, or gallops.  ABDOMEN: Soft, nontender, nondistended. Bowel sounds present. No organomegaly or mass.  EXTREMITIES: B/L LE edema and unna boots  NEUROLOGIC: Cranial nerves II through XII are intact. No focal Motor or sensory deficits b/l.   PSYCHIATRIC: The patient is alert and oriented x 3.  SKIN: No obvious rash, lesion, or ulcer.   LABORATORY PANEL:   CBC Recent Labs  Lab 09/04/17 0508  WBC 10.3  HGB 7.3*  HCT 21.6*  PLT 92*   ------------------------------------------------------------------------------------------------------------------ Chemistries  Recent Labs  Lab 09/04/17 0508  NA 134*  K 3.5  CL 104  CO2 24  GLUCOSE 168*  BUN 32*  CREATININE 1.98*  CALCIUM 8.9  AST 26  ALT 19  ALKPHOS 56  BILITOT 0.6   ------------------------------------------------------------------------------------------------------------------  Cardiac Enzymes No results for input(s): TROPONINI in the last 168 hours. ------------------------------------------------------------------------------------------------------------------  RADIOLOGY:  US Venous Img Lower Unilateral Right  Result Date: 09/03/2017 CLINICAL DATA:  Right leg pain and swelling EXAM: RIGHT LOWER EXTREMITY VENOUS DOPPLER ULTRASOUND TECHNIQUE: Gray-scale sonography with graded compression, as well as color Doppler and duplex ultrasound were performed to evaluate the lower extremity deep venous systems from the level of the common femoral vein and including the common femoral, femoral, profunda femoral, popliteal and calf veins including the posterior tibial, peroneal and gastrocnemius veins when visible. The superficial great saphenous vein was also interrogated. Spectral Doppler was utilized to evaluate flow at rest and with distal augmentation maneuvers in the common femoral, femoral and popliteal veins. COMPARISON:  06/22/2014 FINDINGS: Contralateral Common Femoral Vein: Respiratory phasicity is normal and symmetric  with the symptomatic side. No evidence of thrombus. Normal compressibility. Common Femoral Vein: No evidence of thrombus. Normal compressibility,  respiratory phasicity and response to augmentation. Saphenofemoral Junction: No evidence of thrombus. Normal compressibility and flow on color Doppler imaging. Profunda Femoral Vein: No evidence of thrombus. Normal compressibility and flow on color Doppler imaging. Femoral Vein: No evidence of thrombus. Normal compressibility, respiratory phasicity and response to augmentation. Popliteal Vein: No evidence of thrombus. Normal compressibility, respiratory phasicity and response to augmentation. Calf Veins: No evidence of thrombus. Normal compressibility and flow on color Doppler imaging. Superficial Great Saphenous Vein: No evidence of thrombus. Normal compressibility. Venous Reflux:  None. Other Findings:  None. IMPRESSION: No evidence of deep venous thrombosis. Electronically Signed   By: Inez Catalina M.D.   On: 09/03/2017 18:46     ASSESSMENT AND PLAN:   Jacari Iannello  is a 66 y.o. male with a known history of liver cirrhosis, on lactulose, esophageal varices, GERD, hypertension, is requiring diabetes mellitus is presenting to the ED with a chief complaint of generalized weakness. EMS found him covered with significant stool.   # Bilateral lower extremity cellulitis right greater than left with chronic wounds and edema IV cefepime and vancomycin Blood cx negative DVT ruled out with negative venous Dopplers  Likely d/c home tomorrow with augmentin and OP wound care f/u  #Diarrhea due to lactulose No needs for stool studies  #Insulin requiring diabetes mellitus Continue NovoLog 70/30 50 units twice a day Sliding scale insulin  #Acute kidney injury on chronic kidney disease Improved with IVF Stop  #Peripheral vascular disease Continue wound care and outpatient follow-up with the wound clinic  #History of chronic liver cirrhosis Continue  lactulose,XIFIXAN, Lasix and Zaroxolyn  # anemia of chronic disease at baseline 7-7.5  All the records are reviewed and case discussed with Care Management/Social Worker Management plans discussed with the patient, family and they are in agreement.  CODE STATUS: FULL CODE  DVT Prophylaxis: SCDs  TOTAL TIME TAKING CARE OF THIS PATIENT: 35 minutes.   POSSIBLE D/C IN 1-2 DAYS, DEPENDING ON CLINICAL CONDITION.  Leia Alf Bertie Simien M.D on 09/04/2017 at 6:43 PM  Between 7am to 6pm - Pager - [phone]  After 6pm go to [email or web address] - password [email or web address] [facility]  Office  [phone]  CC: Primary care physician; Baxter Hire, MD  Note: This dictation was prepared with Dragon dictation along with smaller phrase technology. Any transcriptional errors that result from this process are unintentional.

## 2017-09-04 NOTE — Consult Note (Signed)
[place] Nurse wound consult note Reason for Consult: Chronic venous insufficiency.  Seen at [facility] twice weekly for compression. Wounds are newly epithelialized.  Legs are erythematous, tender to touch and weeping.  Wound type:chronic venous Pressure Injury POA: NA Measurement:Wound to left lateral leg, just below knee.  New epithelium noted.  Scar measures 3 cm x 3.4 cm  Right lateral lower leg:  4 cm x 2 cm  Wound bed:new epithelium Drainage (amount, consistency, odor) Generalized weeping to lower legs.  Musty odor.   Periwound:Chronic skin changes.  Skin thickening and significant dirt is cleansed from legs/feet and between toes.  Dressing procedure/placement/frequency:Cleanse bilateral lower legs with soap and water and pat dry.  Wrap from below toes to below knee with zinc layer, followed by kerlix wrap and secured with self adherent Coban.  Change twice weekly.  [place] team will follow Corey Moras RN BSN [facility] Pager [phone]

## 2017-09-04 NOTE — Plan of Care (Signed)
  Education: Knowledge of General Education information will improve 09/04/2017 0410 - Progressing by Jeffie Pollock, RN   Clinical Measurements: Ability to maintain clinical measurements within normal limits will improve 09/04/2017 0410 - Progressing by Jeffie Pollock, RN   Activity: Risk for activity intolerance will decrease 09/04/2017 0410 - Progressing by Jeffie Pollock, RN   Nutrition: Adequate nutrition will be maintained 09/04/2017 0410 - Progressing by Jeffie Pollock, RN   Skin Integrity: Skin integrity will improve 09/04/2017 0410 - Not Progressing by Jeffie Pollock, RN

## 2017-09-05 ENCOUNTER — Ambulatory Visit: Payer: Medicare Other | Admitting: Surgery

## 2017-09-05 LAB — CBC WITH DIFFERENTIAL/PLATELET
BASOS ABS: 0 10*3/uL (ref 0–0.1)
BASOS PCT: 0 %
Eosinophils Absolute: 0.2 10*3/uL (ref 0–0.7)
Eosinophils Relative: 3 %
HEMATOCRIT: 22.5 % — AB (ref 40.0–52.0)
Hemoglobin: 7.6 g/dL — ABNORMAL LOW (ref 13.0–18.0)
LYMPHS PCT: 11 %
Lymphs Abs: 0.9 10*3/uL — ABNORMAL LOW (ref 1.0–3.6)
MCH: 31.4 pg (ref 26.0–34.0)
MCHC: 33.6 g/dL (ref 32.0–36.0)
MCV: 93.6 fL (ref 80.0–100.0)
MONO ABS: 0.5 10*3/uL (ref 0.2–1.0)
Monocytes Relative: 6 %
NEUTROS ABS: 6.6 10*3/uL — AB (ref 1.4–6.5)
Neutrophils Relative %: 80 %
PLATELETS: 111 10*3/uL — AB (ref 150–440)
RBC: 2.4 MIL/uL — AB (ref 4.40–5.90)
RDW: 17 % — AB (ref 11.5–14.5)
WBC: 8.3 10*3/uL (ref 3.8–10.6)

## 2017-09-05 LAB — BASIC METABOLIC PANEL
ANION GAP: 7 (ref 5–15)
BUN: 41 mg/dL — ABNORMAL HIGH (ref 6–20)
CALCIUM: 9.2 mg/dL (ref 8.9–10.3)
CO2: 26 mmol/L (ref 22–32)
Chloride: 102 mmol/L (ref 101–111)
Creatinine, Ser: 2.15 mg/dL — ABNORMAL HIGH (ref 0.61–1.24)
GFR calc Af Amer: 35 mL/min — ABNORMAL LOW (ref >60)
GFR, EST NON AFRICAN AMERICAN: 31 mL/min — AB (ref >60)
Glucose, Bld: 137 mg/dL — ABNORMAL HIGH (ref 65–99)
POTASSIUM: 3.5 mmol/L (ref 3.5–5.1)
SODIUM: 135 mmol/L (ref 135–145)

## 2017-09-05 LAB — GLUCOSE, CAPILLARY: Glucose-Capillary: 156 mg/dL — ABNORMAL HIGH (ref 65–99)

## 2017-09-05 LAB — URINE CULTURE

## 2017-09-05 LAB — HIV ANTIBODY (ROUTINE TESTING W REFLEX): HIV SCREEN 4TH GENERATION: NONREACTIVE

## 2017-09-05 MED ORDER — GERHARDT'S BUTT CREAM: 1.0000 "application " | TOPICAL_CREAM | Freq: Two times a day (BID) | CUTANEOUS | 1 refills | Status: DC

## 2017-09-05 MED ORDER — AMOXICILLIN-POT CLAVULANATE 875-125 MG PO TABS: 1.0000 | ORAL_TABLET | Freq: Two times a day (BID) | ORAL | 0 refills | Status: AC

## 2017-09-05 MED ORDER — RISAQUAD PO CAPS: 2.0000 | ORAL_CAPSULE | Freq: Three times a day (TID) | ORAL | 0 refills | Status: DC

## 2017-09-05 NOTE — Progress Notes (Signed)
Inpatient Diabetes Program Recommendations  AACE/ADA: New Consensus Statement on Inpatient Glycemic Control (2015)  Target Ranges:  Prepandial:   less than 140 mg/dL      Peak postprandial:   less than 180 mg/dL (1-2 hours)      Critically ill patients:  140 - 180 mg/dL   Lab Results  Component Value Date   GLUCAP 156 (H) 09/05/2017   HGBA1C 7.0 (H) 09/04/2017    Review of Glycemic Control  Results for ATLEY, NEUBERT (MRN [id number]) as of 09/05/2017 10:45  Ref. Range 09/04/2017 08:01 09/04/2017 12:04 09/04/2017 16:34 09/04/2017 20:50 09/05/2017 07:56  Glucose-Capillary Latest Ref Range: 65 - 99 mg/dL 164 (H) 188 (H) 229 (H) 204 (H) 156 (H)    Diabetes history: Type 2 Outpatient Diabetes medications: Novolog mix 70/30 84 units qam, 65 units qpm  Current orders for Inpatient glycemic control: Novolog mix 70/30 50 units bid, Novolog 0-9 units tid, Novolog 0-5 units qhs  Inpatient Diabetes Program Recommendations:   Agree with current medications for blood sugar management.   Gentry Fitz, RN, BA, MHA, CDE Diabetes Coordinator Inpatient Diabetes Program  [phone] (Team Pager) [phone] ([place]) 09/05/2017 10:48 AM

## 2017-09-05 NOTE — Progress Notes (Signed)
Pt for discharge home. A/o. No resp distress.  Instructions  For discharge discussed with pt. presc given and discussed. Home meds discussed. Diet / activity and f/u discussed.  Verbalize understanding. Home via w/c at this time w/o c/o.

## 2017-09-05 NOTE — Plan of Care (Signed)
  Progressing Education: Knowledge of General Education information will improve 09/05/2017 1059 - Progressing by Rowe Robert, RN Clinical Measurements: Ability to maintain clinical measurements within normal limits will improve 09/05/2017 1059 - Progressing by Rowe Robert, RN Activity: Risk for activity intolerance will decrease 09/05/2017 1059 - Progressing by Rowe Robert, RN Nutrition: Adequate nutrition will be maintained 09/05/2017 1059 - Progressing by Rowe Robert, RN Skin Integrity: Skin integrity will improve 09/05/2017 1059 - Progressing by Rowe Robert, RN

## 2017-09-05 NOTE — Discharge Instructions (Signed)
Follow-up with primary care physician in one week Follow-up with vascular surgery in one week Continue wound care Follow-up with nephrology in 2 weeks

## 2017-09-05 NOTE — Discharge Summary (Signed)
[facility] NAME: Corey West    MR#:  [id number]  DATE OF BIRTH:  25-Jul-1951  DATE OF ADMISSION:  09/03/2017 ADMITTING PHYSICIAN: Nicholes Mango, MD  DATE OF DISCHARGE: 09/05/17  PRIMARY CARE PHYSICIAN: Baxter Hire, MD    ADMISSION DIAGNOSIS:  Confusion [R41.0] Weakness [R53.1] Cellulitis of right lower extremity [P80.998]  DISCHARGE DIAGNOSIS:  Active Problems:   Cellulitis   SECONDARY DIAGNOSIS:   Past Medical History:  Diagnosis Date  . Anemia   . Anxiety    controlled;   . Arthritis   . AVM (arteriovenous malformation) of stomach, acquired with hemorrhage   . Barrett's esophagus   . Chronic kidney disease    renal infufficiency  . Cirrhosis ([place])   . Depression    controlled;   Marland Kitchen Diabetes mellitus without complication ([place])    not controlled, taking insulin but sugar continues to run high;   . Esophageal varices ([place])   . GERD (gastroesophageal reflux disease)   . History of hiatal hernia   . Hypertension    controlled well;   . Polyp, stomach    with chronic blood loss  . Sleep apnea    does not wear a cpap, Medicare would not pay for it   . Venous stasis of both lower extremities    with cellulitis    HOSPITAL COURSE:   JohnJamesis a66 y.o.malewith a known history of liver cirrhosis, on lactulose, esophageal varices, GERD, hypertension, is requiring diabetes mellitus is presenting to the ED with a chief complaint of generalized weakness. EMS found him covered with significant stool.   #Bilateral lower extremity cellulitis right greater than left with chronic wounds and edema IV cefepime and vancomycin antibiotics given during the hospital course. Clinically improved Blood cx negative DVT ruled out with negative venous Dopplers   d/c home with augmentin and OP wound care f/u  #Diarrhea due to lactulose No needs for stool studies Improved  #Insulin requiring diabetes  mellitus Continue NovoLog 70/30 50 units twice a day Sliding scale insulin  #Acute kidney injury on chronic kidney disease Improved with IVF, seems to be at his baseline Outpatient follow-up with nephrology   #Peripheral vascular disease Continue wound care and outpatient follow-up with the wound clinic  #History of chronic liver cirrhosis Continue lactulose,XIFIXAN,Lasix and Zaroxolyn  # anemia of chronic disease at baseline 7-7.6    DISCHARGE CONDITIONS:   stable  CONSULTS OBTAINED:     PROCEDURES  None   DRUG ALLERGIES:  No Known Allergies  DISCHARGE MEDICATIONS:   Allergies as of 09/05/2017   No Known Allergies     Medication List    TAKE these medications   acidophilus Caps capsule Take 2 capsules by mouth 3 (three) times daily.   ammonium lactate 12 % cream Commonly known as:  AMLACTIN Apply topically 2 (two) times daily.   amoxicillin-clavulanate 875-125 MG tablet Commonly known as:  AUGMENTIN Take 1 tablet by mouth 2 (two) times daily for 14 days.   FERROUS SULFATE ER PO Take 325 mg by mouth 2 (two) times daily.   furosemide 40 MG tablet Commonly known as:  LASIX Take 40 mg by mouth 2 (two) times daily.   Gerhardt's butt cream Crea Apply 1 application topically 2 (two) times daily.   insulin aspart protamine- aspart (70-30) 100 UNIT/ML injection Commonly known as:  NOVOLOG MIX 70/30 Inject 0.5 mLs (50 Units total) into the skin 2 (two) times daily with a  meal. Pt uses 84 units with breakfast and 62 units with supper. What changed:    how much to take  additional instructions   lactulose 10 GM/15ML solution Commonly known as:  CHRONULAC Take 30 g by mouth at bedtime.   LORazepam 0.5 MG tablet Commonly known as:  ATIVAN Take 0.5 mg by mouth 3 (three) times daily as needed for anxiety.   metolazone 2.5 MG tablet Commonly known as:  ZAROXOLYN Take 2.5 mg by mouth daily.   multivitamin tablet Take 1 tablet by mouth daily.    nadolol 40 MG tablet Commonly known as:  CORGARD Take 40 mg by mouth daily.   omeprazole 40 MG capsule Commonly known as:  PRILOSEC Take 40 mg by mouth 2 (two) times daily.   potassium chloride SA 20 MEQ tablet Commonly known as:  K-DUR,KLOR-CON Take 1 tablet by mouth daily.   rifaximin 550 MG Tabs tablet Commonly known as:  XIFAXAN Take 550 mg by mouth 2 (two) times daily.   spironolactone 25 MG tablet Commonly known as:  ALDACTONE Take 25 mg by mouth daily.        DISCHARGE INSTRUCTIONS:   Follow-up with primary care physician in one week. PCP to follow up on the pending urine culture and sensitivity Follow-up with vascular surgery in one week Continue wound care Follow-up with nephrology in 2 weeks  DIET:  Cardiac diet and Diabetic diet  DISCHARGE CONDITION:  Fair  ACTIVITY:  Activity as tolerated  OXYGEN:  Home Oxygen: No.   Oxygen Delivery: room air  DISCHARGE LOCATION:  home   If you experience worsening of your admission symptoms, develop shortness of breath, life threatening emergency, suicidal or homicidal thoughts you must seek medical attention immediately by calling 911 or calling your MD immediately  if symptoms less severe.  You Must read complete instructions/literature along with all the possible adverse reactions/side effects for all the Medicines you take and that have been prescribed to you. Take any new Medicines after you have completely understood and accpet all the possible adverse reactions/side effects.   Please note  You were cared for by a hospitalist during your hospital stay. If you have any questions about your discharge medications or the care you received while you were in the hospital after you are discharged, you can call the unit and asked to speak with the hospitalist on call if the hospitalist that took care of you is not available. Once you are discharged, your primary care physician will handle any further medical issues.  Please note that NO REFILLS for any discharge medications will be authorized once you are discharged, as it is imperative that you return to your primary care physician (or establish a relationship with a primary care physician if you do not have one) for your aftercare needs so that they can reassess your need for medications and monitor your lab values.     Today  Chief Complaint  Patient presents with  . Diarrhea   Patient is feeling much better, wants to go home  ROS:  CONSTITUTIONAL: Denies fevers, chills. Denies any fatigue, weakness.  EYES: Denies blurry vision, double vision, eye pain. EARS, NOSE, THROAT: Denies tinnitus, ear pain, hearing loss. RESPIRATORY: Denies cough, wheeze, shortness of breath.  CARDIOVASCULAR: Denies chest pain, palpitations, edema.  GASTROINTESTINAL: Denies nausea, vomiting, diarrhea, abdominal pain. Denies bright red blood per rectum. GENITOURINARY: Denies dysuria, hematuria. ENDOCRINE: Denies nocturia or thyroid problems. HEMATOLOGIC AND LYMPHATIC: Denies easy bruising or bleeding. SKIN: Denies rash or  lesion. MUSCULOSKELETAL: Denies pain in neck, back, shoulder, knees, hips or arthritic symptoms.  NEUROLOGIC: Denies paralysis, paresthesias.  PSYCHIATRIC: Denies anxiety or depressive symptoms.   VITAL SIGNS:  Blood pressure (!) 120/45, pulse (!) 55, temperature 98.2 F (36.8 C), temperature source Oral, resp. rate 17, height 5\' 9"  (1.753 m), weight (!) 140.1 kg (308 lb 14.4 oz), SpO2 100 %.  I/O:    Intake/Output Summary (Last 24 hours) at 09/05/2017 1052 Last data filed at 09/04/2017 2204 Gross per 24 hour  Intake 741 ml  Output -  Net 741 ml    PHYSICAL EXAMINATION:  GENERAL:  66 y.o.-year-old patient lying in the bed with no acute distress.  EYES: Pupils equal, round, reactive to light and accommodation. No scleral icterus. Extraocular muscles intact.  HEENT: Head atraumatic, normocephalic. Oropharynx and nasopharynx clear.  NECK:   Supple, no jugular venous distention. No thyroid enlargement, no tenderness.  LUNGS: Normal breath sounds bilaterally, no wheezing, rales,rhonchi or crepitation. No use of accessory muscles of respiration.  CARDIOVASCULAR: S1, S2 normal. No murmurs, rubs, or gallops.  ABDOMEN: Soft, non-tender, non-distended. Bowel sounds present. No organomegaly or mass.  EXTREMITIES: Bilateral lower extremities in unna boot . Chronic vascular changes No pedal edema, cyanosis, or clubbing.  NEUROLOGIC: Cranial nerves II through XII are intact. Muscle strength at his baseline in all extremities. Sensation intact. Gait not checked.  PSYCHIATRIC: The patient is alert and oriented x 3.  SKIN: No obvious rash, lesion, or ulcer.   DATA REVIEW:   CBC Recent Labs  Lab 09/05/17 0423  WBC 8.3  HGB 7.6*  HCT 22.5*  PLT 111*    Chemistries  Recent Labs  Lab 09/04/17 0508 09/05/17 0423  NA 134* 135  K 3.5 3.5  CL 104 102  CO2 24 26  GLUCOSE 168* 137*  BUN 32* 41*  CREATININE 1.98* 2.15*  CALCIUM 8.9 9.2  AST 26  --   ALT 19  --   ALKPHOS 56  --   BILITOT 0.6  --     Cardiac Enzymes No results for input(s): TROPONINI in the last 168 hours.  Microbiology Results  Results for orders placed or performed during the hospital encounter of 09/03/17  Urine Culture     Status: None (Preliminary result)   Collection Time: 09/03/17  3:53 PM  Result Value Ref Range Status   Specimen Description URINE, RANDOM  Final   Special Requests NONE  Final   Culture   Final    CULTURE REINCUBATED FOR BETTER GROWTH Performed at [facility], [place] [address], [place], [place] [address]    Report Status PENDING  Incomplete  Urine culture     Status: Abnormal   Collection Time: 09/03/17  3:53 PM  Result Value Ref Range Status   Specimen Description URINE, RANDOM  Final   Special Requests NONE  Final   Culture MULTIPLE SPECIES PRESENT, SUGGEST RECOLLECTION (A)  Final   Report Status 09/05/2017 FINAL  Final   Blood culture (routine x 2)     Status: None (Preliminary result)   Collection Time: 09/03/17  7:53 PM  Result Value Ref Range Status   Specimen Description BLOOD RFOA  Final   Special Requests   Final    BOTTLES DRAWN AEROBIC AND ANAEROBIC Blood Culture adequate volume   Culture NO GROWTH 2 DAYS  Final   Report Status PENDING  Incomplete  Blood culture (routine x 2)     Status: None (Preliminary result)   Collection  Time: 09/03/17  7:53 PM  Result Value Ref Range Status   Specimen Description BLOOD LFOA  Final   Special Requests   Final    BOTTLES DRAWN AEROBIC AND ANAEROBIC Blood Culture adequate volume   Culture NO GROWTH 2 DAYS  Final   Report Status PENDING  Incomplete    RADIOLOGY:  US Venous Img Lower Unilateral Right  Result Date: 09/03/2017 CLINICAL DATA:  Right leg pain and swelling EXAM: RIGHT LOWER EXTREMITY VENOUS DOPPLER ULTRASOUND TECHNIQUE: Gray-scale sonography with graded compression, as well as color Doppler and duplex ultrasound were performed to evaluate the lower extremity deep venous systems from the level of the common femoral vein and including the common femoral, femoral, profunda femoral, popliteal and calf veins including the posterior tibial, peroneal and gastrocnemius veins when visible. The superficial great saphenous vein was also interrogated. Spectral Doppler was utilized to evaluate flow at rest and with distal augmentation maneuvers in the common femoral, femoral and popliteal veins. COMPARISON:  06/22/2014 FINDINGS: Contralateral Common Femoral Vein: Respiratory phasicity is normal and symmetric with the symptomatic side. No evidence of thrombus. Normal compressibility. Common Femoral Vein: No evidence of thrombus. Normal compressibility, respiratory phasicity and response to augmentation. Saphenofemoral Junction: No evidence of thrombus. Normal compressibility and flow on color Doppler imaging. Profunda Femoral Vein: No evidence of thrombus. Normal  compressibility and flow on color Doppler imaging. Femoral Vein: No evidence of thrombus. Normal compressibility, respiratory phasicity and response to augmentation. Popliteal Vein: No evidence of thrombus. Normal compressibility, respiratory phasicity and response to augmentation. Calf Veins: No evidence of thrombus. Normal compressibility and flow on color Doppler imaging. Superficial Great Saphenous Vein: No evidence of thrombus. Normal compressibility. Venous Reflux:  None. Other Findings:  None. IMPRESSION: No evidence of deep venous thrombosis. Electronically Signed   By: Inez Catalina M.D.   On: 09/03/2017 18:46    EKG:   Orders placed or performed during the hospital encounter of 09/03/17  . ED EKG  . ED EKG  . EKG 12-Lead  . EKG 12-Lead      Management plans discussed with the patient, he is in agreement.  CODE STATUS:     Code Status Orders  (From admission, onward)        Start     Ordered   09/03/17 2125  Full code  Continuous     09/03/17 2124    Code Status History    Date Active Date Inactive Code Status Order ID Comments User Context   06/26/2017 11:19 06/28/2017 17:48 Full Code [id number]  Henreitta Leber, MD Inpatient   02/21/2017 20:07 02/22/2017 18:03 Full Code [id number]  [name], MD Inpatient   07/20/2015 12:45 07/22/2015 22:17 Full Code [id number]  Nicholes Mango, MD Inpatient   02/08/2015 23:31 02/11/2015 19:43 Full Code [id number]  Hower, Aaron Mose, MD ED      TOTAL TIME TAKING CARE OF THIS PATIENT: 45  minutes.   Note: This dictation was prepared with Dragon dictation along with smaller phrase technology. Any transcriptional errors that result from this process are unintentional.   @[facility] @  on 09/05/2017 at 10:52 AM  Between 7am to 6pm - Pager - [phone]  After 6pm go to [email or web address] - password [email or web address] [facility]  Office  [phone]  CC: Primary care physician; Baxter Hire, MD

## 2017-09-05 NOTE — Care Management Important Message (Signed)
Important Message  Patient Details  Name: DAYSEN GUNDRUM MRN: [id number] Date of Birth: 1951-05-31   Medicare Important Message Given:  Yes    Shelbie Ammons, RN 09/05/2017, 6:55 AM

## 2017-09-07 LAB — URINE CULTURE: Culture: 30000 — AB

## 2017-09-08 LAB — CULTURE, BLOOD (ROUTINE X 2)
CULTURE: NO GROWTH
Culture: NO GROWTH
SPECIAL REQUESTS: ADEQUATE
Special Requests: ADEQUATE

## 2017-09-10 ENCOUNTER — Ambulatory Visit: Payer: Medicare Other | Admitting: Internal Medicine

## 2017-09-17 ENCOUNTER — Ambulatory Visit: Payer: Medicare Other | Admitting: Internal Medicine

## 2017-09-19 ENCOUNTER — Ambulatory Visit
Admission: RE | Admit: 2017-09-19 | Discharge: 2017-09-19 | Disposition: A | Discharge status: Home / Self Care | Payer: Medicare Other | Source: Ambulatory Visit | Attending: Unknown Physician Specialty | Admitting: Unknown Physician Specialty

## 2017-09-19 ENCOUNTER — Encounter: Payer: Medicare Other | Attending: Nurse Practitioner | Admitting: Nurse Practitioner

## 2017-09-19 DIAGNOSIS — F17290 Nicotine dependence, other tobacco product, uncomplicated: Secondary | ICD-10-CM | POA: Insufficient documentation

## 2017-09-19 DIAGNOSIS — K7460 Unspecified cirrhosis of liver: Secondary | ICD-10-CM | POA: Insufficient documentation

## 2017-09-19 DIAGNOSIS — Z6841 Body Mass Index (BMI) 40.0 and over, adult: Secondary | ICD-10-CM | POA: Insufficient documentation

## 2017-09-19 DIAGNOSIS — Z794 Long term (current) use of insulin: Secondary | ICD-10-CM | POA: Diagnosis not present

## 2017-09-19 DIAGNOSIS — L97211 Non-pressure chronic ulcer of right calf limited to breakdown of skin: Secondary | ICD-10-CM | POA: Insufficient documentation

## 2017-09-19 DIAGNOSIS — I87331 Chronic venous hypertension (idiopathic) with ulcer and inflammation of right lower extremity: Secondary | ICD-10-CM | POA: Insufficient documentation

## 2017-09-19 DIAGNOSIS — D500 Iron deficiency anemia secondary to blood loss (chronic): Secondary | ICD-10-CM | POA: Diagnosis present

## 2017-09-19 DIAGNOSIS — I899 Noninfective disorder of lymphatic vessels and lymph nodes, unspecified: Secondary | ICD-10-CM | POA: Insufficient documentation

## 2017-09-19 DIAGNOSIS — Q2733 Arteriovenous malformation of digestive system vessel: Secondary | ICD-10-CM | POA: Insufficient documentation

## 2017-09-19 DIAGNOSIS — D649 Anemia, unspecified: Secondary | ICD-10-CM | POA: Insufficient documentation

## 2017-09-19 DIAGNOSIS — E11622 Type 2 diabetes mellitus with other skin ulcer: Secondary | ICD-10-CM | POA: Insufficient documentation

## 2017-09-19 DIAGNOSIS — G4733 Obstructive sleep apnea (adult) (pediatric): Secondary | ICD-10-CM | POA: Insufficient documentation

## 2017-09-19 DIAGNOSIS — I890 Lymphedema, not elsewhere classified: Secondary | ICD-10-CM | POA: Insufficient documentation

## 2017-09-19 DIAGNOSIS — N189 Chronic kidney disease, unspecified: Secondary | ICD-10-CM | POA: Insufficient documentation

## 2017-09-19 DIAGNOSIS — E1122 Type 2 diabetes mellitus with diabetic chronic kidney disease: Secondary | ICD-10-CM | POA: Diagnosis not present

## 2017-09-19 DIAGNOSIS — I129 Hypertensive chronic kidney disease with stage 1 through stage 4 chronic kidney disease, or unspecified chronic kidney disease: Secondary | ICD-10-CM | POA: Diagnosis not present

## 2017-09-19 LAB — HEMOGLOBIN: Hemoglobin: 8.4 g/dL — ABNORMAL LOW (ref 13.0–18.0)

## 2017-09-19 NOTE — OR Nursing (Signed)
Patient arrived ambulatory to [facility] for blood work draw in preparation for a blood transfusion tomorrow. Type and screen obtained and sent to lab. Patient having shortness of breath with ambulation. Feels tired and breathing is more difficult when his blood work is out of [employer]. Discharged ambulatory when task completed and patient informed to return at 7:30 am for 1 unit of RBC.

## 2017-09-20 ENCOUNTER — Ambulatory Visit
Admission: RE | Admit: 2017-09-20 | Discharge: 2017-09-20 | Disposition: A | Discharge status: Home / Self Care | Payer: Medicare Other | Source: Ambulatory Visit | Attending: Unknown Physician Specialty | Admitting: Unknown Physician Specialty

## 2017-09-20 DIAGNOSIS — K7460 Unspecified cirrhosis of liver: Secondary | ICD-10-CM | POA: Diagnosis not present

## 2017-09-20 DIAGNOSIS — D500 Iron deficiency anemia secondary to blood loss (chronic): Secondary | ICD-10-CM | POA: Insufficient documentation

## 2017-09-20 DIAGNOSIS — Q2733 Arteriovenous malformation of digestive system vessel: Secondary | ICD-10-CM | POA: Insufficient documentation

## 2017-09-20 LAB — PREPARE RBC (CROSSMATCH)

## 2017-09-21 LAB — BPAM RBC
Blood Product Expiration Date: 201812282359
ISSUE DATE / TIME: 201812210737
Unit Type and Rh: 600

## 2017-09-21 LAB — TYPE AND SCREEN
ABO/RH(D): A NEG
Antibody Screen: NEGATIVE
UNIT DIVISION: 0

## 2017-09-21 NOTE — Progress Notes (Signed)
[name], West ([id number]) Visit Report for 09/19/2017 Chief Complaint Document Details Patient Name: Corey West, Corey West. Date of Service: 09/19/2017 3:15 PM Medical Record Number: [id number] Patient Account Number: [id number] Date of Birth/Sex: 07-30-1951 (66 y.o. Male) Treating RN: Montey Hora Primary Care Provider: [name] Other Clinician: Referring Provider: [name] Treating Provider/Extender: Cathie Olden in Treatment: 21 Information Obtained from: Patient Chief Complaint He is here in follow up evaluation of ble ulcers Electronic Signature(s) Signed: 09/19/2017 4:55:16 PM By: Lawanda Cousins Entered By: Lawanda Cousins on 09/19/2017 16:20:29 Corey West ([id number]) -------------------------------------------------------------------------------- HPI Details Patient Name: Corey West Date of Service: 09/19/2017 3:15 PM Medical Record Number: [id number] Patient Account Number: [id number] Date of Birth/Sex: May 09, 1951 (66 y.o. Male) Treating RN: Montey Hora Primary Care Provider: [name] Other Clinician: Referring Provider: [name] Treating Provider/Extender: Cathie Olden in Treatment: 21 History of Present Illness HPI Description: Pleasant 66 year old with a past medical history significant for morbid obesity, insulin-dependent type 2 diabetes (hemoglobin A1c 7.2 in January 2015), cirrhosis, and chronic kidney disease. He reports a left anterior calf ulcer since January 2015. He has had venous stasis ulcers in the past, which have healed spontaneously. No history of DVT. Does not regularly wear compression. Ambulating normally per his baseline. Recently completed a course of antibiotics. Treated with a Profore light and Coban 2 layer compression drainage, which both fell down and he cut off. Tolerated Coban lite compression bandage. Returns to clinic for followup. No complaints today. No pain at this time. No fever or chills. No open  wounds at this time. He is having difficulty finding compression garments that fit him. 04/23/17; this is now 66 year old man who returns to our clinic for nonhealing wounds on the right lateral and right posterior calf. He tells me that they have been present for 6 months. In the setting of severe right greater than left lower extremity lymphedema likely secondary to chronic venous insufficiency. He has been followed in the lymphedema clinic at least since early June with compression wraps although these have not really helped and the patient is here for our review of this situation. The patient has had problems with edema in his legs for years. He has not worn compression. He is type II diabetic that sounds poorly controlled. He also has obstructive sleep apnea and cirrhosis. He is occasional cigar smoker 04/30/17; patient is a 66 year old man who has severe bilateral lower extremity lymphedema stage III since his early 36s he tells me. This is no doubt secondary to chronic venous insufficiency. He was going to the lymphedema clinic who forwarded him here due to skin breakdown in the right leg. He has not previously worn compression stockings. He has a large area of denuded epithelium on the posterior leg. He has a more open wound on the right lateral leg 05/07/17 on evaluation today patient appears to be doing about the same his wounds are showing signs of improvement and looking fairly well. With that being said he is not having any severe discomfort just some mild aching due to the swelling and really this is worse on the left than the right and the right side is where he actually has his wounds. He has been tolerating the [employer] wraps without complication and states this is actually much better than the wraps that he was getting at the lymphedema clinic most recently. He is happy with these wraps. He has no nausea, vomiting, diarrhea and no fevers or chills. We are still waiting  to hear back  in regard to the lymphedema pumps which I do think would be beneficial for him even with the compression wraps he still has a significant amount of edema noted of the bilateral lower extremities. 05/15/17 on evaluation today patient's right lower extremity edema and weeping especially the posterior portion appears to be doing better. He still is having some issue here but fortunately it's minimal compared to previous weeks. He continues to tolerate the compression fairly well. Overall he is pleased with how things are progressing. No fevers, chills, nausea, or vomiting noted at this time. And patient is having only minimal discomfort. 05/22/17; the medial wound on the right leg actually looks considerably better and is epithelialized. He still has a small punched out area on the right lateral leg. Edema control is marginal end of the [employer]. We are working on getting him compression pumps 05/29/17; the patient's lymphedema pumps are apparently arriving tonight. In the meantime he took his Unna boots off last Sunday because the edema had progressed to the point that a word to titrate. Since then he has put nothing on his right leg and more specifically nothing on the right medial and lateral wounds 06/05/17; the patient has his lymphedema pumps and he is using them once a day on the right leg. There is a sizable reduction in the circumference of his leg although he arrived in clinic today somewhat short of breath. 07/02/17; since the patient was last in clinic he was admitted on 2 occasions. He was admitted from 9/12 through 9/13 with lactic acidosis weakness and felt to have left lower extremity cellulitis. Due to the elevated lactic acid level he was felt to have sepsis although this was ruled out and the lactic acidosis was felt secondary to his chronic cirrhosis. He was initially treated with IV vancomycin for cellulitis and redness of his left leg but then this was subsequently recognized to be  secondary to STARLIN, STEIB. ([id number]) chronic venous stasis and chronic lymphedema. When he left the hospital he had Unna boots placed bilaterally. He was readmitted on 9/26 through 9/28 with GI bleeding. His EGD showed Barrett's esophagus and gastritis. He was discharged with a hemoglobin of 8 to follow-up with GI as an outpatient. There was nothing more about his lower extremities during this short hospital stay. The patient has not been using his external compression pumps fearing fluid overload and shortness of breath although he feels better now that his hemoglobin is stabilized. He continues to have epithelial loss on his bilateral lower extremities. 07/09/17; patient has a large but superficial area on the right posterior leg and on the left anterior leg. He tells me he is been using his external compression pumps once a day for the required hour. He states the pumps hurt his right ankle. 07/17/17; patient was transfused a further 2 units since he was here the last time. His hemoglobin was once again found to be low. He has not been able to use his external compression pumps because of a combination of prolonged power outage in his home and I think issues with his other medical care. His wounds have actually worsened he now has loss of epithelialization all the way from the right lateral to the right medial posteriorly. The left side which has anterior loss of epithelialization also appears to be worse on the lateral aspect of the leg where there is denuded epithelium now as well. Leaking edema fluid. He has bilateral lymphedema. 08/12/2017 -- the  patient has not been here for 25 days and has had the same compression wraps which have not been changed!! He says he was being worked up for a GI bleed and had some transfusions since he was seen last by Dr. Dellia Nims 08/20/17; mostly by review of what the patient is saying and a quick glance at his records in care everywhere I think this patient  probably has Karlene Lineman, cirrhosis and portal hypertension. He is bleeding I think from gastric area and he is requiring recurrent transfusions. He has been told that there is nothing more that can be done and that his prognosis is poor. He arrived in our office in our office in tears. We've been using silver alginate and 3 layer compression to his bilateral lower extremity wounds. He has significant lymphedema likely secondary to chronic venous insufficiency. He has external compression pumps at home that he had actually before he came to this clinic but he is not using 08/27/17; patient is doing well, wounds on his left leg are tiny one anteriorly and one laterally. He still has substantial wound on the posterior aspect of his right calf extending laterally for over this looks better than last time. He tells me he is able to use his compression pumps once a day on most days. He has his hemoglobin checked on Friday 09-19-17 He arrives today for follow up evaluation. He was unable to come to the clinic last week r/t weather conditions, therefore compression has been in place for two weeks. He presents today with healed LLE and one partial thickness open area to the posterior RL. He states he is experiencing diarrhea secondary to antibiotic that will be stopped next week and has been unable to use lymphedema pumps. Will continue with compression bilaterally, follow up next week with plans to discharge and transition to lymphedema pumps Electronic Signature(s) Signed: 09/19/2017 4:55:16 PM By: Lawanda Cousins Entered By: Lawanda Cousins on 09/19/2017 16:24:08 Corey West ([id number]) -------------------------------------------------------------------------------- Physician Orders Details Patient Name: [name] Date of Service: 09/19/2017 3:15 PM Medical Record Number: [id number] Patient Account Number: [id number] Date of Birth/Sex: June 27, 1951 (66 y.o. Male) Treating RN: Montey Hora Primary Care  Provider: [name] Other Clinician: Referring Provider: [name] Treating Provider/Extender: Cathie Olden in Treatment: 35 Verbal / Phone Orders: No Diagnosis Coding Wound Cleansing Wound #3 Right,Circumferential Lower Leg o Clean wound with Normal Saline. o Cleanse wound with mild soap and water Wound #5 Left,Anterior Lower Leg o Clean wound with Normal Saline. o Cleanse wound with mild soap and water Skin Barriers/Peri-Wound Care Wound #3 Right,Circumferential Lower Leg o Triamcinolone Acetonide Ointment Wound #5 Left,Anterior Lower Leg o Triamcinolone Acetonide Ointment Primary Wound Dressing Wound #3 Right,Circumferential Lower Leg o Other: - adaptic Wound #5 Left,Anterior Lower Leg o Other: - adaptic Secondary Dressing Wound #3 Right,Circumferential Lower Leg o ABD pad o XtraSorb Wound #5 Left,Anterior Lower Leg o ABD pad o XtraSorb Dressing Change Frequency Wound #3 Right,Circumferential Lower Leg o Change dressing every week Wound #5 Left,Anterior Lower Leg o Change dressing every week Edema Control Wound #3 Right,Circumferential Lower Leg o 3 Layer Compression System - Right Lower Extremity - unna to anchor o Compression Pump: Use compression pump on left lower extremity for 30 minutes, twice daily. KELWIN, GIBLER ([id number]) Wound #5 Left,Anterior Lower Leg o 3 Layer Compression System - Right Lower Extremity - unna to anchor o Compression Pump: Use compression pump on left lower extremity for 30 minutes, twice daily. Additional Orders / Instructions  Wound #3 Right,Circumferential Lower Leg o Increase protein intake. o Other: - Get Blood Sugars below 150 Wound #5 Left,Anterior Lower Leg o Increase protein intake. o Other: - Get Blood Sugars below 150 Electronic Signature(s) Signed: 09/19/2017 4:55:16 PM By: Lawanda Cousins Entered By: Lawanda Cousins on 09/19/2017 16:24:36 Corey West  ([id number]) -------------------------------------------------------------------------------- Problem List Details Patient Name: YOGESH, COMINSKY A. Date of Service: 09/19/2017 3:15 PM Medical Record Number: [id number] Patient Account Number: [id number] Date of Birth/Sex: 09-Dec-1950 (66 y.o. Male) Treating RN: Montey Hora Primary Care Provider: [name] Other Clinician: Referring Provider: [name] Treating Provider/Extender: Cathie Olden in Treatment: 21 Active Problems ICD-10 Encounter Code Description Active Date Diagnosis I89.0 Lymphedema, not elsewhere classified 04/23/2017 Yes L97.211 Non-pressure chronic ulcer of right calf limited to breakdown of skin 04/23/2017 Yes I87.331 Chronic venous hypertension (idiopathic) with ulcer and 04/23/2017 Yes inflammation of right lower extremity E11.622 Type 2 diabetes mellitus with other skin ulcer 04/23/2017 Yes I89.9 Noninfective disorder of lymphatic vessels and lymph nodes, 04/23/2017 Yes unspecified Inactive Problems Resolved Problems Electronic Signature(s) Signed: 09/19/2017 4:55:16 PM By: Lawanda Cousins Entered By: Lawanda Cousins on 09/19/2017 16:18:58 Corey West ([id number]) -------------------------------------------------------------------------------- Progress Note Details Patient Name: Corey West Date of Service: 09/19/2017 3:15 PM Medical Record Number: [id number] Patient Account Number: [id number] Date of Birth/Sex: Oct 30, 1950 (66 y.o. Male) Treating RN: Montey Hora Primary Care Provider: [name] Other Clinician: Referring Provider: [name] Treating Provider/Extender: Cathie Olden in Treatment: 21 Subjective Chief Complaint Information obtained from Patient He is here in follow up evaluation of ble ulcers History of Present Illness (HPI) Pleasant 66 year old with a past medical history significant for morbid obesity, insulin-dependent type 2 diabetes (hemoglobin A1c 7.2 in  January 2015), cirrhosis, and chronic kidney disease. He reports a left anterior calf ulcer since January 2015. He has had venous stasis ulcers in the past, which have healed spontaneously. No history of DVT. Does not regularly wear compression. Ambulating normally per his baseline. Recently completed a course of antibiotics. Treated with a Profore light and Coban 2 layer compression drainage, which both fell down and he cut off. Tolerated Coban lite compression bandage. Returns to clinic for followup. No complaints today. No pain at this time. No fever or chills. No open wounds at this time. He is having difficulty finding compression garments that fit him. 04/23/17; this is now 66 year old man who returns to our clinic for nonhealing wounds on the right lateral and right posterior calf. He tells me that they have been present for 6 months. In the setting of severe right greater than left lower extremity lymphedema likely secondary to chronic venous insufficiency. He has been followed in the lymphedema clinic at least since early June with compression wraps although these have not really helped and the patient is here for our review of this situation. The patient has had problems with edema in his legs for years. He has not worn compression. He is type II diabetic that sounds poorly controlled. He also has obstructive sleep apnea and cirrhosis. He is occasional cigar smoker 04/30/17; patient is a 66 year old man who has severe bilateral lower extremity lymphedema stage III since his early 49s he tells me. This is no doubt secondary to chronic venous insufficiency. He was going to the lymphedema clinic who forwarded him here due to skin breakdown in the right leg. He has not previously worn compression stockings. He has a large area of denuded epithelium on the posterior leg. He has a more open wound  on the right lateral leg 05/07/17 on evaluation today patient appears to be doing about the same his  wounds are showing signs of improvement and looking fairly well. With that being said he is not having any severe discomfort just some mild aching due to the swelling and really this is worse on the left than the right and the right side is where he actually has his wounds. He has been tolerating the [employer] wraps without complication and states this is actually much better than the wraps that he was getting at the lymphedema clinic most recently. He is happy with these wraps. He has no nausea, vomiting, diarrhea and no fevers or chills. We are still waiting to hear back in regard to the lymphedema pumps which I do think would be beneficial for him even with the compression wraps he still has a significant amount of edema noted of the bilateral lower extremities. 05/15/17 on evaluation today patient's right lower extremity edema and weeping especially the posterior portion appears to be doing better. He still is having some issue here but fortunately it's minimal compared to previous weeks. He continues to tolerate the compression fairly well. Overall he is pleased with how things are progressing. No fevers, chills, nausea, or vomiting noted at this time. And patient is having only minimal discomfort. 05/22/17; the medial wound on the right leg actually looks considerably better and is epithelialized. He still has a small punched out area on the right lateral leg. Edema control is marginal end of the [employer]. We are working on getting him compression pumps 05/29/17; the patient's lymphedema pumps are apparently arriving tonight. In the meantime he took his Unna boots off last Sunday because the edema had progressed to the point that a word to titrate. Since then he has put nothing on his right leg and more specifically nothing on the right medial and lateral wounds 06/05/17; the patient has his lymphedema pumps and he is using them once a day on the right leg. There is a sizable reduction TYREQUE, FINKEN. ([id number]) in the circumference of his leg although he arrived in clinic today somewhat short of breath. 07/02/17; since the patient was last in clinic he was admitted on 2 occasions. He was admitted from 9/12 through 9/13 with lactic acidosis weakness and felt to have left lower extremity cellulitis. Due to the elevated lactic acid level he was felt to have sepsis although this was ruled out and the lactic acidosis was felt secondary to his chronic cirrhosis. He was initially treated with IV vancomycin for cellulitis and redness of his left leg but then this was subsequently recognized to be secondary to chronic venous stasis and chronic lymphedema. When he left the hospital he had Unna boots placed bilaterally. He was readmitted on 9/26 through 9/28 with GI bleeding. His EGD showed Barrett's esophagus and gastritis. He was discharged with a hemoglobin of 8 to follow-up with GI as an outpatient. There was nothing more about his lower extremities during this short hospital stay. The patient has not been using his external compression pumps fearing fluid overload and shortness of breath although he feels better now that his hemoglobin is stabilized. He continues to have epithelial loss on his bilateral lower extremities. 07/09/17; patient has a large but superficial area on the right posterior leg and on the left anterior leg. He tells me he is been using his external compression pumps once a day for the required hour. He states the pumps hurt  his right ankle. 07/17/17; patient was transfused a further 2 units since he was here the last time. His hemoglobin was once again found to be low. He has not been able to use his external compression pumps because of a combination of prolonged power outage in his home and I think issues with his other medical care. His wounds have actually worsened he now has loss of epithelialization all the way from the right lateral to the right medial posteriorly.  The left side which has anterior loss of epithelialization also appears to be worse on the lateral aspect of the leg where there is denuded epithelium now as well. Leaking edema fluid. He has bilateral lymphedema. 08/12/2017 -- the patient has not been here for 25 days and has had the same compression wraps which have not been changed!! He says he was being worked up for a GI bleed and had some transfusions since he was seen last by Dr. Dellia Nims 08/20/17; mostly by review of what the patient is saying and a quick glance at his records in care everywhere I think this patient probably has Karlene Lineman, cirrhosis and portal hypertension. He is bleeding I think from gastric area and he is requiring recurrent transfusions. He has been told that there is nothing more that can be done and that his prognosis is poor. He arrived in our office in our office in tears. We've been using silver alginate and 3 layer compression to his bilateral lower extremity wounds. He has significant lymphedema likely secondary to chronic venous insufficiency. He has external compression pumps at home that he had actually before he came to this clinic but he is not using 08/27/17; patient is doing well, wounds on his left leg are tiny one anteriorly and one laterally. He still has substantial wound on the posterior aspect of his right calf extending laterally for over this looks better than last time. He tells me he is able to use his compression pumps once a day on most days. He has his hemoglobin checked on Friday 09-19-17 He arrives today for follow up evaluation. He was unable to come to the clinic last week r/t weather conditions, therefore compression has been in place for two weeks. He presents today with healed LLE and one partial thickness open area to the posterior RL. He states he is experiencing diarrhea secondary to antibiotic that will be stopped next week and has been unable to use lymphedema pumps. Will continue with  compression bilaterally, follow up next week with plans to discharge and transition to lymphedema pumps Patient History Information obtained from Patient. Family History Diabetes - Mother, Heart Disease - Father, Hypertension - Mother, Seizures - Father, No family history of Cancer, Kidney Disease, Lung Disease, Stroke, Thyroid Problems. Social History Light tobacco smoker, Alcohol Use - Never, Drug Use - No History, Caffeine Use - Moderate. Medical History Hospitalization/Surgery History - 10/28/2013, [facility], passed out and fell. Medical And Surgical History Notes Constitutional Symptoms (General Health) high bood pressure, liver function decline, diabetic, Cirrhosis of liver Genitourinary antibiotics for kidney infection LYELL, CLUGSTON ([id number]) Objective Constitutional Vitals Time Taken: 3:14 PM, Height: 69 in, Weight: 305.4 lbs, BMI: 45.1, Temperature: 97.6 F, Pulse: 70 bpm, Respiratory Rate: 22 breaths/min, Blood Pressure: 141/58 mmHg. Integumentary (Hair, Skin) Wound #3 status is Open. Original cause of wound was Gradually Appeared. The wound is located on the Right,Circumferential Lower Leg. The wound measures 0.1cm length x 0.1cm width x 0.1cm depth; 0.008cm^2 area and 0.001cm^3 volume. There is no tunneling  or undermining noted. There is a large amount of serous drainage noted. Foul odor after cleansing was noted. The wound margin is flat and intact. There is large (67-100%) pink granulation within the wound bed. There is no necrotic tissue within the wound bed. The periwound skin appearance exhibited: Maceration. The periwound skin appearance did not exhibit: Callus, Crepitus, Excoriation, Induration, Rash, Scarring, Dry/Scaly, Atrophie Blanche, Cyanosis, Ecchymosis, Hemosiderin Staining, Mottled, Pallor, Rubor, Erythema. Periwound temperature was noted as No Abnormality. The periwound has tenderness on palpation. Wound #5 status is Open. Original cause of wound was Gradually  Appeared. The wound is located on the Left,Anterior Lower Leg. The wound measures 0.1cm length x 0.1cm width x 0.1cm depth; 0.008cm^2 area and 0.001cm^3 volume. There is no tunneling or undermining noted. There is a large amount of serous drainage noted. The wound margin is distinct with the outline attached to the wound base. There is large (67-100%) pink granulation within the wound bed. There is no necrotic tissue within the wound bed. The periwound skin appearance exhibited: Maceration. Periwound temperature was noted as No Abnormality. The periwound has tenderness on palpation. 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 There was a Three Layer Compression Therapy Procedure by Montey Hora, RN. Corey West ([id number]) Post procedure Diagnosis Wound #3: Same as Pre-Procedure Wound #5 Pre-procedure diagnosis of Wound #5 is a Lymphedema located on the Left,Anterior Lower Leg . There was a Three Layer Compression Therapy Procedure by Montey Hora, RN. Post procedure Diagnosis Wound #5: Same as Pre-Procedure Plan Wound Cleansing: Wound #3 Right,Circumferential Lower Leg: Clean wound with Normal Saline. Cleanse wound with mild soap and water Wound #5 Left,Anterior Lower Leg: Clean wound with Normal Saline. Cleanse wound with mild soap and water Skin Barriers/Peri-Wound Care: Wound #3 Right,Circumferential Lower Leg: Triamcinolone Acetonide Ointment Wound #5 Left,Anterior Lower Leg: Triamcinolone Acetonide  Ointment Primary Wound Dressing: Wound #3 Right,Circumferential Lower Leg: Other: - adaptic Wound #5 Left,Anterior Lower Leg: Other: - adaptic Secondary Dressing: Wound #3 Right,Circumferential Lower Leg: ABD pad XtraSorb Wound #5 Left,Anterior Lower Leg: ABD pad XtraSorb Dressing Change Frequency: Wound #3 Right,Circumferential Lower Leg: Change dressing every week Wound #5 Left,Anterior Lower Leg: Change dressing every week Edema Control: Wound #3 Right,Circumferential Lower Leg: 3 Layer Compression System - Right Lower Extremity - unna to anchor Compression Pump: Use compression pump on left lower extremity for 30 minutes, twice daily. Wound #5 Left,Anterior Lower Leg: 3 Layer Compression System - Right Lower Extremity - unna to anchor Compression Pump: Use compression pump on left lower extremity for 30 minutes, twice daily. Additional Orders / Instructions: Wound #3 Right,Circumferential Lower Leg: Increase protein intake. Other: - Get Blood Sugars below 150 Wound #5 Left,Anterior Lower Leg: Increase protein intake. Other: - Get Blood Sugars below 150 ALEGANDRO, MACNAUGHTON A. ([id number]) 1. 3 layer compression bilaterally 2. follow up next week, anticipate discharge at that time Electronic Signature(s) Signed: 09/19/2017 4:55:16 PM By: Lawanda Cousins Entered By: Lawanda Cousins on 09/19/2017 16:54:02 Corey West ([id number]) -------------------------------------------------------------------------------- ROS/PFSH Details Patient Name: [name] Date of Service: 09/19/2017 3:15 PM Medical Record Number: [id number] Patient  Account Number: [id number] Date of Birth/Sex: 1951-04-30 (66 y.o. Male) Treating RN: Montey Hora Primary Care Provider: [name] Other Clinician: Referring Provider: [name] Treating Provider/Extender: Cathie Olden in Treatment: 21 Information Obtained From Patient Wound History Do you currently have one or more open woundso  Yes How many open wounds do you currently haveo 2 Approximately how long have you had your woundso 6 months How have you been treating your wound(s) until nowo nothing Has your wound(s) ever healed and then re-openedo No Have you had any lab work done in the past montho No Have you tested positive for an antibiotic resistant organism (MRSA, VRE)o No Have you tested positive for osteomyelitis (bone infection)o No Have you had any tests for circulation on your legso No Constitutional Symptoms (General Health) Medical History: Past Medical History Notes: high bood pressure, liver function decline, diabetic, Cirrhosis of liver Eyes Medical History: Positive for: Cataracts Negative for: Glaucoma Ear/Nose/Mouth/Throat Medical History: Negative for: Chronic sinus problems/congestion; Middle ear problems Hematologic/Lymphatic Medical History: Positive for: Anemia Negative for: Hemophilia; Human Immunodeficiency Virus; Lymphedema; Sickle Cell Disease Respiratory Medical History: Positive for: Sleep Apnea Negative for: Aspiration; Asthma; Chronic Obstructive Pulmonary Disease (COPD); Pneumothorax; Tuberculosis Cardiovascular Medical History: Positive for: Hypertension Negative for: Angina; Arrhythmia; Congestive Heart Failure; Coronary Artery Disease; Deep Vein Thrombosis; Hypotension; Myocardial Infarction; Peripheral Arterial Disease; Peripheral Venous Disease; Phlebitis; Vasculitis SHARVIL, HOEY ([id number]) Gastrointestinal Medical History: Positive for: Cirrhosis Negative for: Colitis; Crohnos; Hepatitis A; Hepatitis B; Hepatitis C Endocrine Medical History: Positive for: Type II Diabetes Negative for: Type I Diabetes Time with diabetes: 10 years Treated with: Insulin Blood sugar tested every day: Yes Tested : Blood sugar testing results: Breakfast: 280 Genitourinary Medical History: Negative for: End Stage Renal Disease Past Medical History Notes: antibiotics for  kidney infection Immunological Medical History: Negative for: Lupus Erythematosus; Raynaudos; Scleroderma Integumentary (Skin) Medical History: Negative for: History of Burn; History of pressure wounds Musculoskeletal Medical History: Negative for: Gout; Rheumatoid Arthritis; Osteoarthritis; Osteomyelitis Neurologic Medical History: Negative for: Dementia; Neuropathy; Quadriplegia; Paraplegia; Seizure Disorder Oncologic Medical History: Negative for: Received Chemotherapy; Received Radiation Psychiatric Medical History: Negative for: Anorexia/bulimia; Confinement Anxiety HBO Extended History Items Eyes: Cataracts Immunizations KASHIUS, DOMINIC ([id number]) Pneumococcal Vaccine: Received Pneumococcal Vaccination: Yes Implantable Devices Hospitalization / Surgery History Name of Hospital Purpose of Hospitalization/Surgery Date [facility] passed out and fell 10/28/2013 Family and Social History Cancer: No; Diabetes: Yes - Mother; Heart Disease: Yes - Father; Hypertension: Yes - Mother; Kidney Disease: No; Lung Disease: No; Seizures: Yes - Father; Stroke: No; Thyroid Problems: No; Light tobacco smoker; Alcohol Use: Never; Drug Use: No History; Caffeine Use: Moderate; Financial Concerns: No; Food, Clothing or Shelter Needs: No; Support System Lacking: No; Transportation Concerns: No; Advanced Directives: No; Patient does not want information on Advanced Directives; Do not resuscitate: No; Living Will: No; Medical Power of Attorney: No Physician Affirmation I have reviewed and agree with the above information. Electronic Signature(s) Signed: 09/19/2017 4:38:02 PM By: Montey Hora Signed: 09/19/2017 4:55:16 PM By: Lawanda Cousins Entered By: Lawanda Cousins on 09/19/2017 16:24:18 Corey West ([id number]) -------------------------------------------------------------------------------- [place] Details Patient Name: HARRELL, NIEHOFF A. Date of Service: 09/19/2017 Medical Record Number:  [id number] Patient Account Number: [id number] Date of Birth/Sex: 1951/05/13 (66 y.o. 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 Physician Procedures CPT4 Code: [id number] Description: 93241 - WC PHYS LEVEL 3 - EST PT ICD-10 Diagnosis Description H91.444 Non-pressure chronic ulcer of right calf limited to breakdo I89.0 Lymphedema, not elsewhere classified Modifier: wn of skin Quantity: 1 Electronic Signature(s) Signed: 09/19/2017 4:27:40 PM By: Montey Hora Signed: 09/19/2017 4:55:16 PM By: Lawanda Cousins Entered By: Montey Hora on 09/19/2017 16:27:40

## 2017-09-21 NOTE — Progress Notes (Signed)
ALIE, HARDGROVE ([id number]) Visit Report for 09/19/2017 Arrival Information Details Patient Name: Corey West, Corey West. Date of Service: 09/19/2017 3:15 PM Medical Record Number: [id number] Patient Account Number: [id number] Date of Birth/Sex: 12-04-1950 (66 y.o. Male) Treating RN: Montey Hora Primary Care Devonta Blanford: [name] Other Clinician: Referring Llana Deshazo: [name] Treating Ayona Yniguez/Extender: Cathie Olden in Treatment: 21 Visit Information History Since Last Visit Added or deleted any medications: No Patient Arrived: Cane Any new allergies or adverse reactions: No Arrival Time: 15:12 Had a fall or experienced change in No Accompanied By: self activities of daily living that may affect Transfer Assistance: None risk of falls: Patient Identification Verified: Yes Signs or symptoms of abuse/neglect since last visito No Secondary Verification Process Completed: Yes Hospitalized since last visit: No Patient Requires Transmission-Based Precautions: No Has Dressing in Place as Prescribed: Yes Patient Has Alerts: No Has Compression in Place as Prescribed: Yes Pain Present Now: No Electronic Signature(s) Signed: 09/19/2017 4:38:02 PM By: Montey Hora Entered By: Montey Hora on 09/19/2017 15:14:33 [name] ([id number]) -------------------------------------------------------------------------------- Compression Therapy Details Patient Name: Corey Cower A. Date of Service: 09/19/2017 3:15 PM Medical Record Number: [id number] Patient Account Number: [id number] Date of Birth/Sex: 08/23/1951 (66 y.o. Male) Treating RN: Montey Hora Primary Care Markel Mergenthaler: [name] Other Clinician: Referring Osiris Charles: [name] Treating Ceazia Harb/Extender: Cathie Olden in Treatment: 21 Compression Therapy Performed for Wound Assessment: Wound #3 Right,Circumferential Lower Leg Performed By: Clinician Montey Hora, RN Compression Type: Three Layer Post  Procedure Diagnosis Same as Pre-procedure Electronic Signature(s) Signed: 09/19/2017 4:27:29 PM By: Montey Hora Entered By: Montey Hora on 09/19/2017 16:27:29 [name] ([id number]) -------------------------------------------------------------------------------- Compression Therapy Details Patient Name: [name] Date of Service: 09/19/2017 3:15 PM Medical Record Number: [id number] Patient Account Number: [id number] Date of Birth/Sex: Dec 28, 1950 (66 y.o. Male) Treating RN: Montey Hora Primary Care Ninfa Giannelli: [name] Other Clinician: Referring Kinzi Frediani: [name] Treating Samael Blades/Extender: Cathie Olden in Treatment: 21 Compression Therapy Performed for Wound Assessment: Wound #5 Left,Anterior Lower Leg Performed By: Clinician Montey Hora, RN Compression Type: Three Layer Post Procedure Diagnosis Same as Pre-procedure Electronic Signature(s) Signed: 09/19/2017 4:27:29 PM By: Montey Hora Entered By: Montey Hora on 09/19/2017 16:27:29 [name] ([id number]) -------------------------------------------------------------------------------- Encounter Discharge Information Details Patient Name: [name], [name] Date of Service: 09/19/2017 3:15 PM Medical Record Number: [id number] Patient Account Number: [id number] Date of Birth/Sex: [birth year] (66 y.o. Male) Treating RN: Montey Hora Primary Care Lisa Blakeman: [name] Other Clinician: Referring Emersyn Wyss: [name] Treating Eddie Koc/Extender: Cathie Olden in Treatment: 21 Encounter Discharge Information Items Discharge Pain Level: 0 Discharge Condition: Stable Ambulatory Status: Cane Discharge Destination: Home Transportation: Private Auto Accompanied By: self Schedule Follow-up Appointment: Yes Medication Reconciliation completed and No provided to Patient/Care Keagen Heinlen: Provided on Clinical Summary of Care: 09/19/2017 Form Type Recipient Paper Patient  [name] Signature(s) Signed: 09/19/2017 4:28:30 PM By: Montey Hora Entered By: Montey Hora on 09/19/2017 16:28:30 [name] ([id number]) -------------------------------------------------------------------------------- Lower Extremity Assessment Details Patient Name: Corey Cower A. Date of Service: 09/19/2017 3:15 PM Medical Record Number: [id number] Patient Account Number: [id number] Date of Birth/Sex: 1951/01/15 (66 y.o. Male) Treating RN: Montey Hora Primary Care Zelene Barga: [name] Other Clinician: Referring Fuller Makin: [name] Treating Elliette Seabolt/Extender: Cathie Olden in Treatment: 21 Edema Assessment Assessed: [Left: No] [Right: No] Edema: [Left: Yes] [Right: Yes] Calf Left: Right: Point of Measurement: 32 cm From Medial Instep 38.6 cm 46.6 cm Ankle Left: Right: Point of Measurement: 13 cm From Medial Instep 26.6 cm 28 cm  Vascular Assessment Pulses: Dorsalis Pedis Palpable: [Left:Yes] [Right:Yes] Posterior Tibial Extremity colors, hair growth, and conditions: Extremity Color: [Left:Hyperpigmented] [Right:Hyperpigmented] Hair Growth on Extremity: [Left:No] [Right:No] Temperature of Extremity: [Left:Warm] [Right:Warm] Capillary Refill: [Left:< 3 seconds] [Right:< 3 seconds] Toe Nail Assessment Left: Right: Thick: Yes Yes Discolored: Yes Yes Deformed: Yes Yes Improper Length and Hygiene: Yes Yes Electronic Signature(s) Signed: 09/19/2017 4:38:02 PM By: Montey Hora Entered By: Montey Hora on 09/19/2017 15:25:23 [name] ([id number]) -------------------------------------------------------------------------------- Multi Wound Chart Details Patient Name: Corey Cower A. Date of Service: 09/19/2017 3:15 PM Medical Record Number: [id number] Patient Account Number: [id number] Date of Birth/Sex: July 18, 1951 (66 y.o. Male) Treating RN: Montey Hora Primary Care Zniya Cottone: [name] Other Clinician: Referring Lilyahna Sirmon: [name] Treating Daryn Pisani/Extender: Cathie Olden in Treatment: 21 Vital Signs Height(in): 65 Pulse(bpm): 63 Weight(lbs): 305.4 Blood Pressure(mmHg): 141/58 Body Mass Index(BMI): 45 Temperature(F): 97.6 Respiratory Rate 22 (breaths/min): Photos: [3:No Photos] [5:No Photos] [N/A:N/A] Wound Location: [3:Right Lower Leg - Circumfernential] [5:Left Lower Leg - Anterior] [N/A:N/A] Wounding Event: [3:Gradually Appeared] [5:Gradually Appeared] [N/A:N/A] Primary Etiology: [3:Lymphedema] [5:Lymphedema] [N/A:N/A] Secondary Etiology: [3:Diabetic Wound/Ulcer of the Lower Extremity] [5:N/A] [N/A:N/A] Comorbid History: [3:Cataracts, Anemia, Sleep Apnea, Hypertension, Cirrhosis , Type II Diabetes] [5:Cataracts, Anemia, Sleep Apnea, Hypertension, Cirrhosis , Type II Diabetes] [N/A:N/A] Date Acquired: [3:01/14/2017] [5:07/02/2017] [N/A:N/A] Weeks of Treatment: [3:21] [5:11] [N/A:N/A] Wound Status: [3:Open] [5:Open] [N/A:N/A] Clustered Wound: [3:No] [5:Yes] [N/A:N/A] Clustered Quantity: [3:N/A] [5:2] [N/A:N/A] Measurements L x W x D [3:0.1x0.1x0.1] [5:0.1x0.1x0.1] [N/A:N/A] (cm) Area (cm) : [3:0.008] [5:0.008] [N/A:N/A] Volume (cm) : [3:0.001] [5:0.001] [N/A:N/A] % Reduction in Area: [3:100.00%] [5:99.90%] [N/A:N/A] % Reduction in Volume: [3:100.00%] [5:99.90%] [N/A:N/A] Classification: [3:Partial Thickness] [5:Partial Thickness] [N/A:N/A] Exudate Amount: [3:Large] [5:Large] [N/A:N/A] Exudate Type: [3:Serous] [5:Serous] [N/A:N/A] Exudate Color: [3:amber] [5:amber] [N/A:N/A] Foul Odor After Cleansing: [3:Yes] [5:No] [N/A:N/A] Odor Anticipated Due to [3:No] [5:N/A] [N/A:N/A] Product Use: Wound Margin: [3:Flat and Intact] [5:Distinct, outline attached] [N/A:N/A] Granulation Amount: [3:Large (67-100%)] [5:Large (67-100%)] [N/A:N/A] Granulation Quality: [3:Pink] [5:Pink] [N/A:N/A] Necrotic Amount: [3:None Present (0%)] [5:None Present (0%)] [N/A:N/A] Exposed Structures: [3:Fascia: No Fat  Layer (Subcutaneous Tissue) Exposed: No Tendon: No Muscle: No] [5:N/A] [N/A:N/A] Joint: No Bone: No Epithelialization: None None N/A Periwound Skin Texture: Excoriation: No No Abnormalities Noted N/A Induration: No Callus: No Crepitus: No Rash: No Scarring: No Periwound Skin Moisture: Maceration: Yes Maceration: Yes N/A Dry/Scaly: No Periwound Skin Color: Atrophie Blanche: No No Abnormalities Noted N/A Cyanosis: No Ecchymosis: No Erythema: No Hemosiderin Staining: No Mottled: No Pallor: No Rubor: No Temperature: No Abnormality No Abnormality N/A Tenderness on Palpation: Yes Yes N/A Wound Preparation: Ulcer Cleansing: Ulcer Cleansing: N/A Rinsed/Irrigated with Saline Rinsed/Irrigated with Saline Topical Anesthetic Applied: Topical Anesthetic Applied: None None Treatment Notes Electronic Signature(s) Signed: 09/19/2017 4:55:16 PM By: Lawanda Cousins Entered By: Lawanda Cousins on 09/19/2017 16:20:05 [name] ([id number]) -------------------------------------------------------------------------------- [place] Details Patient Name: MAXIMILLIANO, KERSH A. Date of Service: 09/19/2017 3:15 PM Medical Record Number: [id number] Patient Account Number: [id number] Date of Birth/Sex: Dec 22, 1950 (66 y.o. Male) Treating RN: Montey Hora Primary Care Bradford Cazier: [name] Other Clinician: Referring Hetal Proano: [name] Treating Keaston Pile/Extender: Cathie Olden in Treatment: 21 Active Inactive ` Orientation to the Wound Care Program Nursing Diagnoses: Knowledge deficit related to the wound healing center program Goals: Patient/caregiver will verbalize understanding of the [facility] Program Date Initiated: 04/23/2017 Target Resolution Date: 05/04/2017 Goal Status: Active Interventions: Provide education on orientation to the wound center Notes: ` Venous Leg Ulcer Nursing Diagnoses: Potential for venous Insuffiency (use before diagnosis  confirmed) Goals: Patient will maintain optimal edema control Date Initiated: 04/23/2017 Target Resolution Date: 05/04/2017 Goal Status: Active Interventions: Compression as ordered Treatment Activities: Therapeutic compression applied : 04/23/2017 Notes: ` Wound/Skin Impairment Nursing Diagnoses: Impaired tissue integrity Goals: Ulcer/skin breakdown will have a volume reduction of 80% by week 12 Date Initiated: 04/23/2017 Target Resolution Date: 05/04/2017 ALEJANDRO, GAMEL ([id number]) Goal Status: Active Interventions: Assess ulceration(s) every visit Notes: Electronic Signature(s) Signed: 09/19/2017 4:38:02 PM By: Montey Hora Entered By: Montey Hora on 09/19/2017 15:48:26 [name] ([id number]) -------------------------------------------------------------------------------- Pain Assessment Details Patient Name: Corey Cower A. Date of Service: 09/19/2017 3:15 PM Medical Record Number: [id number] Patient Account Number: [id number] Date of Birth/Sex: [birth year] (66 y.o. Male) Treating RN: Montey Hora Primary Care Loghan Kurtzman: [name] Other Clinician: Referring Jacquita Mulhearn: [name] Treating Kaylan Yates/Extender: Cathie Olden in Treatment: 21 Active Problems Location of Pain Severity and Description of Pain Patient Has Paino No Site Locations Pain Management and Medication Current Pain Management: Notes Topical or injectable lidocaine is offered to patient for acute pain when surgical debridement is performed. If needed, Patient is instructed to use over the counter pain medication for the following 24-48 hours after debridement. Wound care MDs do not prescribed pain medications. Patient has chronic pain or uncontrolled pain. Patient has been instructed to make an appointment with their Primary Care Physician for pain management. Electronic Signature(s) Signed: 09/19/2017 4:38:02 PM By: Montey Hora Entered By: Montey Hora on 09/19/2017  15:14:43 [name] ([id number]) -------------------------------------------------------------------------------- Patient/Caregiver Education Details Patient Name: HILMAN, KISSLING A. Date of Service: 09/19/2017 3:15 PM Medical Record Number: [id number] Patient Account Number: [id number] Date of Birth/Gender: 22-Aug-1951 (66 y.o. Male) Treating RN: Montey Hora Primary Care Physician: [name] Other Clinician: Referring Physician: [name] Treating Physician/Extender: Cathie Olden in Treatment: 21 Education Assessment Education Provided To: Patient Education Topics Provided Venous: Handouts: Other: use pumps when you are able to r/t diarrhea Electronic Signature(s) Signed: 09/19/2017 4:38:02 PM By: Montey Hora Entered By: Montey Hora on 09/19/2017 16:28:53 [name] ([id number]) -------------------------------------------------------------------------------- Wound Assessment Details Patient Name: Corey Cower A. Date of Service: 09/19/2017 3:15 PM Medical Record Number: [id number] Patient Account Number: [id number] Date of Birth/Sex: 01/04/1951 (66 y.o. Male) Treating RN: Montey Hora Primary Care Osie Amparo: [name] Other Clinician: Referring Aldine Chakraborty: [name] Treating Walton Digilio/Extender: Cathie Olden in Treatment: 21 Wound Status Wound Number: 3 Primary Lymphedema Etiology: Wound Location: Right Lower Leg - Circumfernential Secondary Diabetic Wound/Ulcer of the Lower Extremity Wounding Event: Gradually Appeared Etiology: Date Acquired: 01/14/2017 Wound Status: Open Weeks Of Treatment: 21 Comorbid Cataracts, Anemia, Sleep Apnea, Clustered Wound: No History: Hypertension, Cirrhosis , Type II Diabetes Photos Photo Uploaded By: Montey Hora on 09/19/2017 16:36:50 Wound Measurements Length: (cm) 0.1 Width: (cm) 0.1 Depth: (cm) 0.1 Area: (cm) 0.008 Volume: (cm) 0.001 % Reduction in Area: 100% % Reduction in Volume:  100% Epithelialization: None Tunneling: No Undermining: No Wound Description Classification: Partial Thickness Foul O Wound Margin: Flat and Intact Due to Exudate Amount: Large Slough Exudate Type: Serous Exudate Color: amber dor After Cleansing: Yes Product Use: No /Fibrino Yes Wound Bed Granulation Amount: Large (67-100%) Exposed Structure Granulation Quality: Pink Fascia Exposed: No Necrotic Amount: None Present (0%) Fat Layer (Subcutaneous Tissue) Exposed: No Tendon Exposed: No Muscle Exposed: No Joint Exposed: No Bone Exposed: No Periwound Skin Texture MYCAH, FORMICA A. ([id number]) Texture Color No Abnormalities Noted: No No Abnormalities Noted: No Callus: No Atrophie Blanche: No Crepitus: No Cyanosis: No Excoriation: No Ecchymosis: No Induration: No Erythema:  No Rash: No Hemosiderin Staining: No Scarring: No Mottled: No Pallor: No Moisture Rubor: No No Abnormalities Noted: No Dry / Scaly: No Temperature / Pain Maceration: Yes Temperature: No Abnormality Tenderness on Palpation: Yes Wound Preparation Ulcer Cleansing: Rinsed/Irrigated with Saline Topical Anesthetic Applied: None Treatment Notes Wound #3 (Right, Circumferential Lower Leg) 1. Cleansed with: Cleanse wound with antibacterial soap and water 4. Dressing Applied: Contact layer 7. Secured with 3 Layer Compression System - Bilateral Electronic Signature(s) Signed: 09/19/2017 4:38:02 PM By: Montey Hora Entered By: Montey Hora on 09/19/2017 15:47:34 [name] ([id number]) -------------------------------------------------------------------------------- Wound Assessment Details Patient Name: HIROTO, SALTZMAN A. Date of Service: 09/19/2017 3:15 PM Medical Record Number: [id number] Patient Account Number: [id number] Date of Birth/Sex: 17-Mar-1951 (66 y.o. Male) Treating RN: Montey Hora Primary Care Falan Hensler: [name] Other Clinician: Referring Mikail Goostree: [name] Treating  Odalis Jordan/Extender: Cathie Olden in Treatment: 21 Wound Status Wound Number: 5 Primary Lymphedema Etiology: Wound Location: Left Lower Leg - Anterior Wound Open Wounding Event: Gradually Appeared Status: Date Acquired: 07/02/2017 Comorbid Cataracts, Anemia, Sleep Apnea, Weeks Of Treatment: 11 History: Hypertension, Cirrhosis , Type II Diabetes Clustered Wound: Yes Photos Photo Uploaded By: Montey Hora on 09/19/2017 16:37:21 Wound Measurements Length: (cm) 0.1 Width: (cm) 0.1 Depth: (cm) 0.1 Clustered Quantity: 2 Area: (cm) 0.008 Volume: (cm) 0.001 % Reduction in Area: 99.9% % Reduction in Volume: 99.9% Epithelialization: None Tunneling: No Undermining: No Wound Description Classification: Partial Thickness Wound Margin: Distinct, outline attached Exudate Amount: Large Exudate Type: Serous Exudate Color: amber Foul Odor After Cleansing: No Slough/Fibrino Yes Wound Bed Granulation Amount: Large (67-100%) Granulation Quality: Pink Necrotic Amount: None Present (0%) Periwound Skin Texture Texture Color No Abnormalities Noted: No No Abnormalities Noted: No Moisture Temperature / Pain IVERY, NANNEY ([id number]) No Abnormalities Noted: No Temperature: No Abnormality Maceration: Yes Tenderness on Palpation: Yes Wound Preparation Ulcer Cleansing: Rinsed/Irrigated with Saline Topical Anesthetic Applied: None Treatment Notes Wound #5 (Left, Anterior Lower Leg) 1. Cleansed with: Cleanse wound with antibacterial soap and water 4. Dressing Applied: Contact layer 7. Secured with 3 Layer Compression System - Bilateral Electronic Signature(s) Signed: 09/19/2017 4:38:02 PM By: Montey Hora Entered By: Montey Hora on 09/19/2017 15:48:18 [name] ([id number]) -------------------------------------------------------------------------------- [place] Details Patient Name: [name] Date of Service: 09/19/2017 3:15 PM Medical Record Number:  [id number] Patient Account Number: [id number] Date of Birth/Sex: November 08, 1950 (66 y.o. Male) Treating RN: Montey Hora Primary Care Jerick Khachatryan: [name] Other Clinician: Referring Gavriella Hearst: [name] Treating Madina Galati/Extender: Cathie Olden in Treatment: 21 Vital Signs Time Taken: 15:14 Temperature (F): 97.6 Height (in): 69 Pulse (bpm): 70 Weight (lbs): 305.4 Respiratory Rate (breaths/min): 22 Body Mass Index (BMI): 45.1 Blood Pressure (mmHg): 141/58 Reference Range: 80 - 120 mg / dl Electronic Signature(s) Signed: 09/19/2017 4:38:02 PM By: Montey Hora Entered By: Montey Hora on 09/19/2017 15:16:47

## 2017-09-27 ENCOUNTER — Encounter: Payer: Medicare Other | Admitting: Surgery

## 2017-09-27 DIAGNOSIS — E11622 Type 2 diabetes mellitus with other skin ulcer: Secondary | ICD-10-CM | POA: Diagnosis not present

## 2017-09-28 NOTE — Progress Notes (Signed)
Corey West ([id number]) Visit Report for 09/27/2017 Chief Complaint Document Details Patient Name: Corey West, Corey West. Date of Service: 09/27/2017 8:15 AM Medical Record Number: [id number] Patient Account Number: [id number] Date of Birth/Sex: 1951/06/11 (66 y.o. Male) Treating RN: Roger Shelter Primary Care Provider: [name] Other Clinician: Referring Provider: [name] Treating Provider/Extender: Frann Rider in Treatment: 22 Information Obtained from: Patient Chief Complaint He is here in follow up evaluation of ble ulcers Electronic Signature(s) Signed: 09/27/2017 9:41:24 AM By: Christin Fudge MD, FACS Entered By: Christin Fudge on 09/27/2017 09:41:24 Corey West ([id number]) -------------------------------------------------------------------------------- HPI Details Patient Name: Corey West Date of Service: 09/27/2017 8:15 AM Medical Record Number: [id number] Patient Account Number: [id number] Date of Birth/Sex: 17-Nov-1950 (66 y.o. Male) Treating RN: Roger Shelter Primary Care Provider: [name] Other Clinician: Referring Provider: [name] Treating Provider/Extender: Frann Rider in Treatment: 5 History of Present Illness HPI Description: Pleasant 66 year old with a past medical history significant for morbid obesity, insulin-dependent type 2 diabetes (hemoglobin A1c 7.2 in January 2015), cirrhosis, and chronic kidney disease. He reports a left anterior calf ulcer since January 2015. He has had venous stasis ulcers in the past, which have healed spontaneously. No history of DVT. Does not regularly wear compression. Ambulating normally per his baseline. Recently completed a course of antibiotics. Treated with a Profore light and Coban 2 layer compression drainage, which both fell down and he cut off. Tolerated Coban lite compression bandage. Returns to clinic for followup. No complaints today. No pain at this time. No fever or chills.  No open wounds at this time. He is having difficulty finding compression garments that fit him. 04/23/17; this is now 66 year old man who returns to our clinic for nonhealing wounds on the right lateral and right posterior calf. He tells me that they have been present for 6 months. In the setting of severe right greater than left lower extremity lymphedema likely secondary to chronic venous insufficiency. He has been followed in the lymphedema clinic at least since early June with compression wraps although these have not really helped and the patient is here for our review of this situation. The patient has had problems with edema in his legs for years. He has not worn compression. He is type II diabetic that sounds poorly controlled. He also has obstructive sleep apnea and cirrhosis. He is occasional cigar smoker 04/30/17; patient is a 66 year old man who has severe bilateral lower extremity lymphedema stage III since his early 77s he tells me. This is no doubt secondary to chronic venous insufficiency. He was going to the lymphedema clinic who forwarded him here due to skin breakdown in the right leg. He has not previously worn compression stockings. He has a large area of denuded epithelium on the posterior leg. He has a more open wound on the right lateral leg 05/07/17 on evaluation today patient appears to be doing about the same his wounds are showing signs of improvement and looking fairly well. With that being said he is not having any severe discomfort just some mild aching due to the swelling and really this is worse on the left than the right and the right side is where he actually has his wounds. He has been tolerating the [employer] wraps without complication and states this is actually much better than the wraps that he was getting at the lymphedema clinic most recently. He is happy with these wraps. He has no nausea, vomiting, diarrhea and no fevers or chills. We are  still waiting to  hear back in regard to the lymphedema pumps which I do think would be beneficial for him even with the compression wraps he still has a significant amount of edema noted of the bilateral lower extremities. 05/15/17 on evaluation today patient's right lower extremity edema and weeping especially the posterior portion appears to be doing better. He still is having some issue here but fortunately it's minimal compared to previous weeks. He continues to tolerate the compression fairly well. Overall he is pleased with how things are progressing. No fevers, chills, nausea, or vomiting noted at this time. And patient is having only minimal discomfort. 05/22/17; the medial wound on the right leg actually looks considerably better and is epithelialized. He still has a small punched out area on the right lateral leg. Edema control is marginal end of the [employer]. We are working on getting him compression pumps 05/29/17; the patient's lymphedema pumps are apparently arriving tonight. In the meantime he took his Unna boots off last Sunday because the edema had progressed to the point that a word to titrate. Since then he has put nothing on his right leg and more specifically nothing on the right medial and lateral wounds 06/05/17; the patient has his lymphedema pumps and he is using them once a day on the right leg. There is a sizable reduction in the circumference of his leg although he arrived in clinic today somewhat short of breath. 07/02/17; since the patient was last in clinic he was admitted on 2 occasions. He was admitted from 9/12 through 9/13 with lactic acidosis weakness and felt to have left lower extremity cellulitis. Due to the elevated lactic acid level he was felt to have sepsis although this was ruled out and the lactic acidosis was felt secondary to his chronic cirrhosis. He was initially treated with IV vancomycin for cellulitis and redness of his left leg but then this was subsequently recognized  to be secondary to AMAN, BONET. ([id number]) chronic venous stasis and chronic lymphedema. When he left the hospital he had Unna boots placed bilaterally. He was readmitted on 9/26 through 9/28 with GI bleeding. His EGD showed Barrett's esophagus and gastritis. He was discharged with a hemoglobin of 8 to follow-up with GI as an outpatient. There was nothing more about his lower extremities during this short hospital stay. The patient has not been using his external compression pumps fearing fluid overload and shortness of breath although he feels better now that his hemoglobin is stabilized. He continues to have epithelial loss on his bilateral lower extremities. 07/09/17; patient has a large but superficial area on the right posterior leg and on the left anterior leg. He tells me he is been using his external compression pumps once a day for the required hour. He states the pumps hurt his right ankle. 07/17/17; patient was transfused a further 2 units since he was here the last time. His hemoglobin was once again found to be low. He has not been able to use his external compression pumps because of a combination of prolonged power outage in his home and I think issues with his other medical care. His wounds have actually worsened he now has loss of epithelialization all the way from the right lateral to the right medial posteriorly. The left side which has anterior loss of epithelialization also appears to be worse on the lateral aspect of the leg where there is denuded epithelium now as well. Leaking edema fluid. He has bilateral lymphedema. 08/12/2017 --  the patient has not been here for 25 days and has had the same compression wraps which have not been changed!! He says he was being worked up for a GI bleed and had some transfusions since he was seen last by Dr. Dellia Nims 08/20/17; mostly by review of what the patient is saying and a quick glance at his records in care everywhere I think  this patient probably has Karlene Lineman, cirrhosis and portal hypertension. He is bleeding I think from gastric area and he is requiring recurrent transfusions. He has been told that there is nothing more that can be done and that his prognosis is poor. He arrived in our office in our office in tears. We've been using silver alginate and 3 layer compression to his bilateral lower extremity wounds. He has significant lymphedema likely secondary to chronic venous insufficiency. He has external compression pumps at home that he had actually before he came to this clinic but he is not using 08/27/17; patient is doing well, wounds on his left leg are tiny one anteriorly and one laterally. He still has substantial wound on the posterior aspect of his right calf extending laterally for over this looks better than last time. He tells me he is able to use his compression pumps once a day on most days. He has his hemoglobin checked on Friday 09-19-17 He arrives today for follow up evaluation. He was unable to come to the clinic last week r/t weather conditions, therefore compression has been in place for two weeks. He presents today with healed LLE and one partial thickness open area to the posterior RL. He states he is experiencing diarrhea secondary to antibiotic that will be stopped next week and has been unable to use lymphedema pumps. Will continue with compression bilaterally, follow up next week with plans to discharge and transition to lymphedema pumps Electronic Signature(s) Signed: 09/27/2017 9:41:35 AM By: Christin Fudge MD, FACS Entered By: Christin Fudge on 09/27/2017 09:41:34 Corey West ([id number]) -------------------------------------------------------------------------------- Physical Exam Details Patient Name: SALAM, MICUCCI A. Date of Service: 09/27/2017 8:15 AM Medical Record Number: [id number] Patient Account Number: [id number] Date of Birth/Sex: September 10, 1951 (66 y.o. Male) Treating RN: Roger Shelter Primary Care Provider: [name] Other Clinician: Referring Provider: [name] Treating Provider/Extender: Frann Rider in Treatment: 22 Constitutional . Pulse regular. Respirations normal and unlabored. Afebrile. . Eyes Nonicteric. Reactive to light. Ears, Nose, Mouth, and Throat Lips, teeth, and gums WNL.Marland Kitchen Moist mucosa without lesions. Neck supple and nontender. No palpable supraclavicular or cervical adenopathy. Normal sized without goiter. Respiratory WNL. No retractions.. Cardiovascular Pedal Pulses WNL. No clubbing, cyanosis or edema. Lymphatic No adneopathy. No adenopathy. No adenopathy. Musculoskeletal Adexa without tenderness or enlargement.. Digits and nails w/o clubbing, cyanosis, infection, petechiae, ischemia, or inflammatory conditions.. Integumentary (Hair, Skin) No suspicious lesions. No crepitus or fluctuance. No peri-wound warmth or erythema. No masses.Marland Kitchen Psychiatric Judgement and insight Intact.. No evidence of depression, anxiety, or agitation.. Notes the right posterior medial calf has a few open areas and the left posterior calf has a few superficial open areas which minimal fluid oozing. The lymphedema is well controlled. Electronic Signature(s) Signed: 09/27/2017 9:42:17 AM By: Christin Fudge MD, FACS Entered By: Christin Fudge on 09/27/2017 09:42:16 Corey West ([id number]) -------------------------------------------------------------------------------- Physician Orders Details Patient Name: DAION, GINSBERG A. Date of Service: 09/27/2017 8:15 AM Medical Record Number: [id number] Patient Account Number: [id number] Date of Birth/Sex: 1951-03-29 (66 y.o. Male) Treating RN: Roger Shelter Primary Care Provider: [name] Other Clinician: Referring Provider: Edwina Barth  Vincen Treating Provider/Extender: Frann Rider in Treatment: 64 Verbal / Phone Orders: No Diagnosis Coding Wound Cleansing Wound #3 Right,Circumferential  Lower Leg o Clean wound with Normal Saline. o Cleanse wound with mild soap and water Wound #5 Left,Anterior Lower Leg o Clean wound with Normal Saline. o Cleanse wound with mild soap and water Primary Wound Dressing Wound #3 Right,Circumferential Lower Leg o Other: - silver cell Wound #5 Left,Anterior Lower Leg o Other: - silvercell Secondary Dressing Wound #3 Right,Circumferential Lower Leg o XtraSorb Wound #5 Left,Anterior Lower Leg o XtraSorb Dressing Change Frequency Wound #3 Right,Circumferential Lower Leg o Change dressing every week Wound #5 Left,Anterior Lower Leg o Change dressing every week Follow-up Appointments Wound #3 Right,Circumferential Lower Leg o Return Appointment in 1 week. Wound #5 Left,Anterior Lower Leg o Return Appointment in 1 week. Edema Control Wound #3 Right,Circumferential Lower Leg o 3 Layer Compression System - Right Lower Extremity - unna to anchor o Compression Pump: Use compression pump on left lower extremity for 30 minutes, twice daily. Wound #5 Left,Anterior Lower Leg Corey West, Corey West ([id number]) o 3 Layer Compression System - Right Lower Extremity - unna to anchor o Compression Pump: Use compression pump on left lower extremity for 30 minutes, twice daily. Additional Orders / Instructions Wound #3 Right,Circumferential Lower Leg o Increase protein intake. o Other: - Get Blood Sugars below 150 Wound #5 Left,Anterior Lower Leg o Increase protein intake. o Other: - Get Blood Sugars below 150 Electronic Signature(s) Signed: 09/27/2017 1:42:42 PM By: Christin Fudge MD, FACS Signed: 09/27/2017 2:36:38 PM By: Roger Shelter Entered By: Roger Shelter on 09/27/2017 09:17:24 Corey West ([id number]) -------------------------------------------------------------------------------- Problem List Details Patient Name: DEMETRIOUS, RAINFORD A. Date of Service: 09/27/2017 8:15 AM Medical Record Number:  [id number] Patient Account Number: [id number] Date of Birth/Sex: 1951-05-16 (66 y.o. Male) Treating RN: Roger Shelter Primary Care Provider: [name] Other Clinician: Referring Provider: [name] Treating Provider/Extender: Frann Rider in Treatment: 22 Active Problems ICD-10 Encounter Code Description Active Date Diagnosis I89.0 Lymphedema, not elsewhere classified 04/23/2017 Yes L97.211 Non-pressure chronic ulcer of right calf limited to breakdown of skin 04/23/2017 Yes I87.331 Chronic venous hypertension (idiopathic) with ulcer and 04/23/2017 Yes inflammation of right lower extremity E11.622 Type 2 diabetes mellitus with other skin ulcer 04/23/2017 Yes I89.9 Noninfective disorder of lymphatic vessels and lymph nodes, 04/23/2017 Yes unspecified Inactive Problems Resolved Problems Electronic Signature(s) Signed: 09/27/2017 9:41:09 AM By: Christin Fudge MD, FACS Entered By: Christin Fudge on 09/27/2017 09:41:09 Corey West ([id number]) -------------------------------------------------------------------------------- Progress Note Details Patient Name: Corey West Date of Service: 09/27/2017 8:15 AM Medical Record Number: [id number] Patient Account Number: [id number] Date of Birth/Sex: 15-Oct-1950 (66 y.o. Male) Treating RN: Roger Shelter Primary Care Provider: [name] Other Clinician: Referring Provider: [name] Treating Provider/Extender: Frann Rider in Treatment: 49 Subjective Chief Complaint Information obtained from Patient He is here in follow up evaluation of ble ulcers History of Present Illness (HPI) Pleasant 66 year old with a past medical history significant for morbid obesity, insulin-dependent type 2 diabetes (hemoglobin A1c 7.2 in January 2015), cirrhosis, and chronic kidney disease. He reports a left anterior calf ulcer since January 2015. He has had venous stasis ulcers in the past, which have healed spontaneously. No  history of DVT. Does not regularly wear compression. Ambulating normally per his baseline. Recently completed a course of antibiotics. Treated with a Profore light and Coban 2 layer compression drainage, which both fell down and he cut off. Tolerated Coban lite compression bandage. Returns to  clinic for followup. No complaints today. No pain at this time. No fever or chills. No open wounds at this time. He is having difficulty finding compression garments that fit him. 04/23/17; this is now 66 year old man who returns to our clinic for nonhealing wounds on the right lateral and right posterior calf. He tells me that they have been present for 6 months. In the setting of severe right greater than left lower extremity lymphedema likely secondary to chronic venous insufficiency. He has been followed in the lymphedema clinic at least since early June with compression wraps although these have not really helped and the patient is here for our review of this situation. The patient has had problems with edema in his legs for years. He has not worn compression. He is type II diabetic that sounds poorly controlled. He also has obstructive sleep apnea and cirrhosis. He is occasional cigar smoker 04/30/17; patient is a 66 year old man who has severe bilateral lower extremity lymphedema stage III since his early 6s he tells me. This is no doubt secondary to chronic venous insufficiency. He was going to the lymphedema clinic who forwarded him here due to skin breakdown in the right leg. He has not previously worn compression stockings. He has a large area of denuded epithelium on the posterior leg. He has a more open wound on the right lateral leg 05/07/17 on evaluation today patient appears to be doing about the same his wounds are showing signs of improvement and looking fairly well. With that being said he is not having any severe discomfort just some mild aching due to the swelling and really this is worse on  the left than the right and the right side is where he actually has his wounds. He has been tolerating the [employer] wraps without complication and states this is actually much better than the wraps that he was getting at the lymphedema clinic most recently. He is happy with these wraps. He has no nausea, vomiting, diarrhea and no fevers or chills. We are still waiting to hear back in regard to the lymphedema pumps which I do think would be beneficial for him even with the compression wraps he still has a significant amount of edema noted of the bilateral lower extremities. 05/15/17 on evaluation today patient's right lower extremity edema and weeping especially the posterior portion appears to be doing better. He still is having some issue here but fortunately it's minimal compared to previous weeks. He continues to tolerate the compression fairly well. Overall he is pleased with how things are progressing. No fevers, chills, nausea, or vomiting noted at this time. And patient is having only minimal discomfort. 05/22/17; the medial wound on the right leg actually looks considerably better and is epithelialized. He still has a small punched out area on the right lateral leg. Edema control is marginal end of the [employer]. We are working on getting him compression pumps 05/29/17; the patient's lymphedema pumps are apparently arriving tonight. In the meantime he took his Unna boots off last Sunday because the edema had progressed to the point that a word to titrate. Since then he has put nothing on his right leg and more specifically nothing on the right medial and lateral wounds 06/05/17; the patient has his lymphedema pumps and he is using them once a day on the right leg. There is a sizable reduction Corey West, Corey West. ([id number]) in the circumference of his leg although he arrived in clinic today somewhat short of breath. 07/02/17;  since the patient was last in clinic he was admitted on 2 occasions. He was  admitted from 9/12 through 9/13 with lactic acidosis weakness and felt to have left lower extremity cellulitis. Due to the elevated lactic acid level he was felt to have sepsis although this was ruled out and the lactic acidosis was felt secondary to his chronic cirrhosis. He was initially treated with IV vancomycin for cellulitis and redness of his left leg but then this was subsequently recognized to be secondary to chronic venous stasis and chronic lymphedema. When he left the hospital he had Unna boots placed bilaterally. He was readmitted on 9/26 through 9/28 with GI bleeding. His EGD showed Barrett's esophagus and gastritis. He was discharged with a hemoglobin of 8 to follow-up with GI as an outpatient. There was nothing more about his lower extremities during this short hospital stay. The patient has not been using his external compression pumps fearing fluid overload and shortness of breath although he feels better now that his hemoglobin is stabilized. He continues to have epithelial loss on his bilateral lower extremities. 07/09/17; patient has a large but superficial area on the right posterior leg and on the left anterior leg. He tells me he is been using his external compression pumps once a day for the required hour. He states the pumps hurt his right ankle. 07/17/17; patient was transfused a further 2 units since he was here the last time. His hemoglobin was once again found to be low. He has not been able to use his external compression pumps because of a combination of prolonged power outage in his home and I think issues with his other medical care. His wounds have actually worsened he now has loss of epithelialization all the way from the right lateral to the right medial posteriorly. The left side which has anterior loss of epithelialization also appears to be worse on the lateral aspect of the leg where there is denuded epithelium now as well. Leaking edema fluid. He has  bilateral lymphedema. 08/12/2017 -- the patient has not been here for 25 days and has had the same compression wraps which have not been changed!! He says he was being worked up for a GI bleed and had some transfusions since he was seen last by Dr. Dellia Nims 08/20/17; mostly by review of what the patient is saying and a quick glance at his records in care everywhere I think this patient probably has Karlene Lineman, cirrhosis and portal hypertension. He is bleeding I think from gastric area and he is requiring recurrent transfusions. He has been told that there is nothing more that can be done and that his prognosis is poor. He arrived in our office in our office in tears. We've been using silver alginate and 3 layer compression to his bilateral lower extremity wounds. He has significant lymphedema likely secondary to chronic venous insufficiency. He has external compression pumps at home that he had actually before he came to this clinic but he is not using 08/27/17; patient is doing well, wounds on his left leg are tiny one anteriorly and one laterally. He still has substantial wound on the posterior aspect of his right calf extending laterally for over this looks better than last time. He tells me he is able to use his compression pumps once a day on most days. He has his hemoglobin checked on Friday 09-19-17 He arrives today for follow up evaluation. He was unable to come to the clinic last week r/t weather conditions, therefore  compression has been in place for two weeks. He presents today with healed LLE and one partial thickness open area to the posterior RL. He states he is experiencing diarrhea secondary to antibiotic that will be stopped next week and has been unable to use lymphedema pumps. Will continue with compression bilaterally, follow up next week with plans to discharge and transition to lymphedema pumps Patient History Information obtained from Patient. Family History Diabetes - Mother,  Heart Disease - Father, Hypertension - Mother, Seizures - Father, No family history of Cancer, Kidney Disease, Lung Disease, Stroke, Thyroid Problems. Social History Light tobacco smoker, Alcohol Use - Never, Drug Use - No History, Caffeine Use - Moderate. Medical History Hospitalization/Surgery History - 10/28/2013, [facility], passed out and fell. Medical And Surgical History Notes Constitutional Symptoms (General Health) high bood pressure, liver function decline, diabetic, Cirrhosis of liver Genitourinary antibiotics for kidney infection Corey West, Corey West ([id number]) Objective Constitutional Pulse regular. Respirations normal and unlabored. Afebrile. Vitals Time Taken: 8:19 AM, Height: 69 in, Weight: 305.4 lbs, BMI: 45.1, Temperature: 98.1 F, Pulse: 68 bpm, Respiratory Rate: 20 breaths/min, Blood Pressure: 159/58 mmHg. Eyes Nonicteric. Reactive to light. Ears, Nose, Mouth, and Throat Lips, teeth, and gums WNL.Marland Kitchen Moist mucosa without lesions. Neck supple and nontender. No palpable supraclavicular or cervical adenopathy. Normal sized without goiter. Respiratory WNL. No retractions.. Cardiovascular Pedal Pulses WNL. No clubbing, cyanosis or edema. Lymphatic No adneopathy. No adenopathy. No adenopathy. Musculoskeletal Adexa without tenderness or enlargement.. Digits and nails w/o clubbing, cyanosis, infection, petechiae, ischemia, or inflammatory conditions.Marland Kitchen Psychiatric Judgement and insight Intact.. No evidence of depression, anxiety, or agitation.. General Notes: the right posterior medial calf has a few open areas and the left posterior calf has a few superficial open areas which minimal fluid oozing. The lymphedema is well controlled. Integumentary (Hair, Skin) No suspicious lesions. No crepitus or fluctuance. No peri-wound warmth or erythema. No masses.. Wound #3 status is Open. Original cause of wound was Gradually Appeared. The wound is located on the Right,Circumferential Lower  Leg. The wound measures 0.1cm length x 0.1cm width x 0.1cm depth; 0.008cm^2 area and 0.001cm^3 volume. There is no tunneling or undermining noted. There is a large amount of serous drainage noted. Foul odor after cleansing was noted. The wound margin is flat and intact. There is large (67-100%) pink granulation within the wound bed. There is no necrotic tissue within the wound bed. The periwound skin appearance exhibited: Maceration. The periwound skin appearance did not exhibit: Callus, Crepitus, Excoriation, Induration, Rash, Scarring, Dry/Scaly, Atrophie Blanche, Cyanosis, Ecchymosis, Hemosiderin Staining, Mottled, Pallor, Rubor, Erythema. Periwound temperature was noted as No Corey West, Corey A. ([id number]) Abnormality. The periwound has tenderness on palpation. Wound #5 status is Open. Original cause of wound was Gradually Appeared. The wound is located on the Left,Anterior Lower Leg. The wound measures 0.1cm length x 0.1cm width x 0.1cm depth; 0.008cm^2 area and 0.001cm^3 volume. There is no tunneling or undermining noted. There is a large amount of serous drainage noted. The wound margin is distinct with the outline attached to the wound base. There is large (67-100%) pink granulation within the wound bed. There is no necrotic tissue within the wound bed. The periwound skin appearance exhibited: Dry/Scaly. The periwound skin appearance did not exhibit: Callus, Crepitus, Excoriation, Induration, Rash, Scarring, Maceration, Atrophie Blanche, Cyanosis, Ecchymosis, Hemosiderin Staining, Mottled, Pallor, Rubor, Erythema. Periwound temperature was noted as No Abnormality. The periwound has tenderness on palpation. 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 Cleanse wound with mild soap and water Wound #5 Left,Anterior Lower Leg: Clean wound with Normal Saline. Cleanse wound with mild soap and water Primary Wound Dressing: Wound #3 Right,Circumferential Lower Leg: Other: - silver cell Wound #5 Left,Anterior Lower Leg: Other: - silvercell Secondary Dressing: Wound #3 Right,Circumferential Lower Leg: XtraSorb Wound #5 Left,Anterior Lower Leg: XtraSorb Dressing Change Frequency: Wound #3 Right,Circumferential Lower Leg: Change dressing every week Wound #5 Left,Anterior Lower Leg: Change dressing every week Follow-up Appointments: Wound #3 Right,Circumferential Lower Leg: Return Appointment in 1 week. Wound #5 Left,Anterior Lower Leg: Corey West, Corey West ([id number]) Return Appointment in 1 week. Edema Control: Wound #3 Right,Circumferential Lower Leg: 3 Layer Compression System - Right Lower Extremity - unna to anchor Compression Pump: Use compression pump on left lower extremity for 30 minutes, twice daily. Wound #5 Left,Anterior Lower Leg: 3 Layer Compression System - Right Lower Extremity - unna to anchor Compression Pump: Use compression pump on left lower extremity for 30 minutes, twice daily. Additional Orders / Instructions: Wound #3 Right,Circumferential Lower Leg: Increase protein intake. Other: - Get Blood Sugars below 150 Wound #5 Left,Anterior Lower Leg: Increase protein intake. 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 Today we will use silver alginate and a 3 layer compression wrap and have asked him to continue with elevation and exercise. Hopefully next week he will have his juxta lites with  him and he may be ready for discharge Electronic Signature(s) Signed: 09/27/2017 9:42:59 AM By: Christin Fudge MD, FACS Entered By: Christin Fudge on 09/27/2017 09:42:59 Corey West ([id number]) -------------------------------------------------------------------------------- ROS/PFSH Details Patient Name: Corey West Date of Service: 09/27/2017 8:15 AM Medical Record Number: [id number] Patient Account Number: [id number] Date of Birth/Sex: May 20, 1951 (66 y.o. Male) Treating RN: Roger Shelter Primary Care Provider: [name] Other Clinician: Referring Provider: [name] Treating Provider/Extender: Frann Rider in Treatment: 22 Information Obtained From Patient Wound History Do you currently have one or more open woundso Yes How many open wounds do you currently haveo 2 Approximately how long have you had your woundso 6 months How have you been treating your wound(s) until nowo nothing Has your wound(s) ever healed and then re-openedo No Have you had any lab work done in the past montho No Have you tested positive for an antibiotic resistant organism (MRSA, VRE)o No Have you tested positive for osteomyelitis (bone infection)o No Have you had any tests for circulation on your legso No Constitutional Symptoms (General Health) Medical History: Past Medical History Notes: high bood pressure, liver function decline, diabetic, Cirrhosis of liver Eyes Medical History: Positive for: Cataracts Negative for: Glaucoma Ear/Nose/Mouth/Throat Medical History: Negative for: Chronic sinus problems/congestion; Middle ear problems Hematologic/Lymphatic Medical History: Positive for: Anemia Negative for: Hemophilia; Human Immunodeficiency Virus; Lymphedema; Sickle Cell Disease Respiratory Medical History: Positive for: Sleep Apnea Negative for: Aspiration; Asthma; Chronic Obstructive Pulmonary Disease (COPD); Pneumothorax; Tuberculosis Cardiovascular Medical  History: Positive for: Hypertension Negative for: Angina; Arrhythmia; Congestive Heart Failure; Coronary Artery Disease; Deep Vein Thrombosis; Hypotension; Myocardial Infarction; Peripheral Arterial Disease; Peripheral Venous Disease; Phlebitis;  Vasculitis Corey West, Corey West ([id number]) Gastrointestinal Medical History: Positive for: Cirrhosis Negative for: Colitis; Crohnos; Hepatitis A; Hepatitis B; Hepatitis C Endocrine Medical History: Positive for: Type II Diabetes Negative for: Type I Diabetes Time with diabetes: 10 years Treated with: Insulin Blood sugar tested every day: Yes Tested : Blood sugar testing results: Breakfast: 280 Genitourinary Medical History: Negative for: End Stage Renal Disease Past Medical History Notes: antibiotics for kidney infection Immunological Medical History: Negative for: Lupus Erythematosus; Raynaudos; Scleroderma Integumentary (Skin) Medical History: Negative for: History of Burn; History of pressure wounds Musculoskeletal Medical History: Negative for: Gout; Rheumatoid Arthritis; Osteoarthritis; Osteomyelitis Neurologic Medical History: Negative for: Dementia; Neuropathy; Quadriplegia; Paraplegia; Seizure Disorder Oncologic Medical History: Negative for: Received Chemotherapy; Received Radiation Psychiatric Medical History: Negative for: Anorexia/bulimia; Confinement Anxiety HBO Extended History Items Eyes: Cataracts Immunizations Corey West, Corey West ([id number]) Pneumococcal Vaccine: Received Pneumococcal Vaccination: Yes Implantable Devices Hospitalization / Surgery History Name of Hospital Purpose of Hospitalization/Surgery Date [facility] passed out and fell 10/28/2013 Family and Social History Cancer: No; Diabetes: Yes - Mother; Heart Disease: Yes - Father; Hypertension: Yes - Mother; Kidney Disease: No; Lung Disease: No; Seizures: Yes - Father; Stroke: No; Thyroid Problems: No; Light tobacco smoker; Alcohol Use: Never; Drug Use: No  History; Caffeine Use: Moderate; Financial Concerns: No; Food, Clothing or Shelter Needs: No; Support System Lacking: No; Transportation Concerns: No; Advanced Directives: No; Patient does not want information on Advanced Directives; Do not resuscitate: No; Living Will: No; Medical Power of Attorney: No Physician Affirmation I have reviewed and agree with the above information. Electronic Signature(s) Signed: 09/27/2017 1:42:42 PM By: Christin Fudge MD, FACS Signed: 09/27/2017 2:36:38 PM By: Roger Shelter Entered By: Christin Fudge on 09/27/2017 09:41:43 Corey West ([id number]) -------------------------------------------------------------------------------- SuperBill Details Patient Name: Corey West, Corey West A. Date of Service: 09/27/2017 Medical Record Number: [id number] Patient Account Number: [id number] Date of Birth/Sex: [birth year] (66 y.o. 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, unspecified Facility Procedures CPT4 Code: [id number] Description: 99214 - WOUND CARE VISIT-LEV 4 EST PT Modifier: Quantity: 1 Physician Procedures CPT4: Description Modifier Quantity Code [id number] - WC PHYS LEVEL 3 - EST PT 1 ICD-10 Diagnosis Description I89.0 Lymphedema, not elsewhere classified L97.211 Non-pressure chronic ulcer of right calf limited to breakdown of skin I87.331 Chronic  venous hypertension (idiopathic) with ulcer and inflammation of right lower  extremity E11.622 Type 2 diabetes mellitus with other skin ulcer Electronic Signature(s) Signed: 09/27/2017 9:43:14 AM By: Christin Fudge MD, FACS Entered By: Christin Fudge on 09/27/2017 09:43:14

## 2017-09-30 NOTE — Progress Notes (Signed)
Corey West, Corey West ([id number]) Visit Report for 09/27/2017 Arrival Information Details Patient Name: Corey West, Corey West. Date of Service: 09/27/2017 8:15 AM Medical Record Number: [id number] Patient Account Number: [id number] Date of Birth/Sex: 06-23-1951 (66 y.o. Male) Treating RN: Roger Shelter Primary Care Abbi Mancini: [name] Other Clinician: Referring Faylene Allerton: [name] Treating Jahleel Stroschein/Extender: Frann Rider in Treatment: 22 Visit Information History Since Last Visit All ordered tests and consults were completed: No Patient Arrived: Corey West Added or deleted any medications: No Arrival Time: 08:18 Any new allergies or adverse reactions: No Accompanied By: self Had a fall or experienced change in No Transfer Assistance: None activities of daily living that may affect Patient Requires Transmission-Based Precautions: No risk of falls: Patient Has Alerts: No Signs or symptoms of abuse/neglect since last visito No Hospitalized since last visit: No Pain Present Now: No Electronic Signature(s) Signed: 09/27/2017 2:36:38 PM By: Roger Shelter Entered By: Roger Shelter on 09/27/2017 08:19:23 Corey West ([id number]) -------------------------------------------------------------------------------- Clinic Level of Care Assessment Details Patient Name: [name], [name] Date of Service: 09/27/2017 8:15 AM Medical Record Number: [id number] Patient Account Number: [id number] Date of Birth/Sex: July 29, 1951 (66 y.o. Male) Treating RN: Roger Shelter Primary Care [name] Grave: [name] Other Clinician: Referring Kollyns Mickelson: [name] Treating Abb Gobert/Extender: Frann Rider in Treatment: 22 Clinic Level of Care Assessment Items TOOL 4 Quantity Score []  - Use when only an EandM is performed on FOLLOW-UP visit 0 ASSESSMENTS - Nursing Assessment / Reassessment X - Reassessment of Co-morbidities (includes updates in patient status) 1 10 X- 1 5 Reassessment of  Adherence to Treatment Plan ASSESSMENTS - Wound and Skin Assessment / Reassessment []  - Simple Wound Assessment / Reassessment - one wound 0 X- 2 5 Complex Wound Assessment / Reassessment - multiple wounds []  - 0 Dermatologic / Skin Assessment (not related to wound area) ASSESSMENTS - Focused Assessment X - Circumferential Edema Measurements - multi extremities 2 5 []  - 0 Nutritional Assessment / Counseling / Intervention []  - 0 Lower Extremity Assessment (monofilament, tuning fork, pulses) []  - 0 Peripheral Arterial Disease Assessment (using hand held doppler) ASSESSMENTS - Ostomy and/or Continence Assessment and Care []  - Incontinence Assessment and Management 0 []  - 0 Ostomy Care Assessment and Management (repouching, etc.) PROCESS - Coordination of Care []  - Simple Patient / Family Education for ongoing care 0 X- 1 20 Complex (extensive) Patient / Family Education for ongoing care []  - 0 Staff obtains Programmer, systems, Records, Test Results / Process Orders []  - 0 Staff telephones HHA, Nursing Homes / Clarify orders / etc []  - 0 Routine Transfer to another Facility (non-emergent condition) []  - 0 Routine Hospital Admission (non-emergent condition) []  - 0 New Admissions / Biomedical engineer / Ordering NPWT, Apligraf, etc. []  - 0 Emergency Hospital Admission (emergent condition) []  - 0 Simple Discharge Coordination Corey West, DUCHESNE. ([id number]) X- 1 15 Complex (extensive) Discharge Coordination PROCESS - Special Needs []  - Pediatric / Minor Patient Management 0 []  - 0 Isolation Patient Management []  - 0 Hearing / Language / Visual special needs []  - 0 Assessment of Community assistance (transportation, D/C planning, etc.) []  - 0 Additional assistance / Altered mentation []  - 0 Support Surface(s) Assessment (bed, cushion, seat, etc.) INTERVENTIONS - Wound Cleansing / Measurement []  - Simple Wound Cleansing - one wound 0 X- 2 5 Complex Wound Cleansing - multiple  wounds X- 1 5 Wound Imaging (photographs - any number of wounds) []  - 0 Wound Tracing (instead of photographs) []  - 0 Simple Wound Measurement - one  wound X- 2 5 Complex Wound Measurement - multiple wounds INTERVENTIONS - Wound Dressings []  - Small Wound Dressing one or multiple wounds 0 X- 2 15 Medium Wound Dressing one or multiple wounds []  - 0 Large Wound Dressing one or multiple wounds []  - 0 Application of Medications - topical []  - 0 Application of Medications - injection INTERVENTIONS - Miscellaneous []  - External ear exam 0 []  - 0 Specimen Collection (cultures, biopsies, blood, body fluids, etc.) []  - 0 Specimen(s) / Culture(s) sent or taken to Lab for analysis []  - 0 Patient Transfer (multiple staff / Civil Service fast streamer / Similar devices) []  - 0 Simple Staple / Suture removal (25 or less) []  - 0 Complex Staple / Suture removal (26 or more) []  - 0 Hypo / Hyperglycemic Management (close monitor of Blood Glucose) []  - 0 Ankle / Brachial Index (ABI) - do not check if billed separately X- 1 5 Vital Signs Corey West, Corey West ([id number]) Has the patient been seen at the hospital within the last three years: Yes Total Score: 130 Level Of Care: New/Established - Level 4 Electronic Signature(s) Signed: 09/27/2017 2:36:38 PM By: Roger Shelter Entered By: Roger Shelter on 09/27/2017 09:18:17 Corey West ([id number]) -------------------------------------------------------------------------------- Encounter Discharge Information Details Patient Name: Corey Cower A. Date of Service: 09/27/2017 8:15 AM Medical Record Number: [id number] Patient Account Number: [id number] Date of Birth/Sex: 1951-07-22 (66 y.o. Male) Treating RN: Roger Shelter Primary Care Tiburcio Linder: [name] Other Clinician: Referring Lakaya Tolen: [name] Treating Franklin Clapsaddle/Extender: Frann Rider in Treatment: 74 Encounter Discharge Information Items Schedule Follow-up Appointment:  No Medication Reconciliation completed and No provided to Patient/Care Anai Lipson: Provided on Clinical Summary of Care: 09/27/2017 Form Type Recipient Paper Patient [name] Signature(s) Signed: 09/30/2017 9:52:38 AM By: Ruthine Dose Entered By: Ruthine Dose on 09/27/2017 09:16:35 Corey West ([id number]) -------------------------------------------------------------------------------- Lower Extremity Assessment Details Patient Name: SURAFEL, HILLEARY A. Date of Service: 09/27/2017 8:15 AM Medical Record Number: [id number] Patient Account Number: [id number] Date of Birth/Sex: June 12, 1951 (66 y.o. Male) Treating RN: Roger Shelter Primary Care Toinette Lackie: [name] Other Clinician: Referring Daran Favaro: [name] Treating Clova Morlock/Extender: Frann Rider in Treatment: 22 Edema Assessment Assessed: [Left: No] [Right: No] Edema: [Left: Yes] [Right: Yes] Calf Left: Right: Point of Measurement: 32 cm From Medial Instep 35.6 cm 41 cm Ankle Left: Right: Point of Measurement: 13 cm From Medial Instep 26 cm 25.2 cm Vascular Assessment Claudication: Claudication Assessment [Left:None] [Right:None] Pulses: Dorsalis Pedis Palpable: [Left:Yes] [Right:Yes] Posterior Tibial Extremity colors, hair growth, and conditions: Extremity Color: [Left:Hyperpigmented] [Right:Hyperpigmented] Hair Growth on Extremity: [Left:No] [Right:No] Temperature of Extremity: [Left:Cool] [Right:Cool] Capillary Refill: [Left:< 3 seconds] [Right:< 3 seconds] Toe Nail Assessment Left: Right: Thick: Yes Yes Discolored: Yes Yes Deformed: Yes Yes Improper Length and Hygiene: Yes Yes Electronic Signature(s) Signed: 09/27/2017 2:36:38 PM By: Roger Shelter Entered By: Roger Shelter on 09/27/2017 08:35:02 Corey West ([id number]) -------------------------------------------------------------------------------- Multi Wound Chart Details Patient Name: Corey Cower A. Date of Service:  09/27/2017 8:15 AM Medical Record Number: [id number] Patient Account Number: [id number] Date of Birth/Sex: May 27, 1951 (66 y.o. Male) Treating RN: Roger Shelter Primary Care Cynthia Stainback: [name] Other Clinician: Referring Durelle Zepeda: [name] Treating Anson Peddie/Extender: Frann Rider in Treatment: 22 Vital Signs Height(in): 69 Pulse(bpm): 54 Weight(lbs): 305.4 Blood Pressure(mmHg): 159/58 Body Mass Index(BMI): 45 Temperature(F): 98.1 Respiratory Rate 20 (breaths/min): Photos: [3:No Photos] [5:No Photos] [N/A:N/A] Wound Location: [3:Right Lower Leg - Circumfernential] [5:Left Lower Leg - Anterior] [N/A:N/A] Wounding Event: [3:Gradually Appeared] [5:Gradually Appeared] [N/A:N/A] Primary Etiology: [3:Lymphedema] [5:Lymphedema] [N/A:N/A]  Secondary Etiology: [3:Diabetic Wound/Ulcer of the Lower Extremity] [5:N/A] [N/A:N/A] Comorbid History: [3:Cataracts, Anemia, Sleep Apnea, Hypertension, Cirrhosis , Type II Diabetes] [5:Cataracts, Anemia, Sleep Apnea, Hypertension, Cirrhosis , Type II Diabetes] [N/A:N/A] Date Acquired: [3:01/14/2017] [5:07/02/2017] [N/A:N/A] Weeks of Treatment: [3:22] [5:12] [N/A:N/A] Wound Status: [3:Open] [5:Open] [N/A:N/A] Clustered Wound: [3:No] [5:Yes] [N/A:N/A] Clustered Quantity: [3:N/A] [5:2] [N/A:N/A] Measurements L x W x D [3:0.1x0.1x0.1] [5:0.1x0.1x0.1] [N/A:N/A] (cm) Area (cm) : [3:0.008] [5:0.008] [N/A:N/A] Volume (cm) : [3:0.001] [5:0.001] [N/A:N/A] % Reduction in Area: [3:100.00%] [5:99.90%] [N/A:N/A] % Reduction in Volume: [3:100.00%] [5:99.90%] [N/A:N/A] Classification: [3:Partial Thickness] [5:Partial Thickness] [N/A:N/A] Exudate Amount: [3:Large] [5:Large] [N/A:N/A] Exudate Type: [3:Serous] [5:Serous] [N/A:N/A] Exudate Color: [3:amber] [5:amber] [N/A:N/A] Foul Odor After Cleansing: [3:Yes] [5:No] [N/A:N/A] Odor Anticipated Due to [3:No] [5:N/A] [N/A:N/A] Product Use: Wound Margin: [3:Flat and Intact] [5:Distinct, outline  attached] [N/A:N/A] Granulation Amount: [3:Large (67-100%)] [5:Large (67-100%)] [N/A:N/A] Granulation Quality: [3:Pink] [5:Pink] [N/A:N/A] Necrotic Amount: [3:None Present (0%)] [5:None Present (0%)] [N/A:N/A] Exposed Structures: [3:Fascia: No Fat Layer (Subcutaneous Tissue) Exposed: No Tendon: No Muscle: No] [5:N/A] [N/A:N/A] Joint: No Bone: No Epithelialization: None None N/A Periwound Skin Texture: Excoriation: No Excoriation: No N/A Induration: No Induration: No Callus: No Callus: No Crepitus: No Crepitus: No Rash: No Rash: No Scarring: No Scarring: No Periwound Skin Moisture: Maceration: Yes Dry/Scaly: Yes N/A Dry/Scaly: No Maceration: No Periwound Skin Color: Atrophie Blanche: No Atrophie Blanche: No N/A Cyanosis: No Cyanosis: No Ecchymosis: No Ecchymosis: No Erythema: No Erythema: No Hemosiderin Staining: No Hemosiderin Staining: No Mottled: No Mottled: No Pallor: No Pallor: No Rubor: No Rubor: No Temperature: No Abnormality No Abnormality N/A Tenderness on Palpation: Yes Yes N/A Wound Preparation: Ulcer Cleansing: Ulcer Cleansing: N/A Rinsed/Irrigated with Saline Rinsed/Irrigated with Saline Topical Anesthetic Applied: Topical Anesthetic Applied: None None Treatment Notes Wound #3 (Right, Circumferential Lower Leg) 1. Cleansed with: Clean wound with Normal Saline Cleanse wound with antibacterial soap and water 2. Anesthetic Topical Lidocaine 4% cream to wound bed prior to debridement 3. Peri-wound Care: Moisturizing lotion 4. Dressing Applied: Other dressing (specify in notes) 7. Secured with 3 Layer Compression System - Bilateral Notes silvercell and xtrasorb Wound #5 (Left, Anterior Lower Leg) 1. Cleansed with: Clean wound with Normal Saline Cleanse wound with antibacterial soap and water 2. Anesthetic Topical Lidocaine 4% cream to wound bed prior to debridement 3. Peri-wound Care: Moisturizing lotion 4. Dressing Applied: Other  dressing (specify in notes) 7. Secured with 3 Layer Compression System - Bilateral Corey West, Corey West ([id number]) Notes silvercell and xtrasorb Electronic Signature(s) Signed: 09/27/2017 9:41:14 AM By: Christin Fudge MD, FACS Entered By: Christin Fudge on 09/27/2017 09:41:14 Corey West ([id number]) -------------------------------------------------------------------------------- [place] Details Patient Name: MAXDEN, NAJI A. Date of Service: 09/27/2017 8:15 AM Medical Record Number: [id number] Patient Account Number: [id number] Date of Birth/Sex: 1951-05-20 (66 y.o. Male) Treating RN: Roger Shelter Primary Care Sampson Self: [name] Other Clinician: Referring Pati Thinnes: [name] Treating Dagan Heinz/Extender: Frann Rider in Treatment: 22 Active Inactive ` Orientation to the Wound Care Program Nursing Diagnoses: Knowledge deficit related to the wound healing center program Goals: Patient/caregiver will verbalize understanding of the [facility] Program Date Initiated: 04/23/2017 Target Resolution Date: 05/04/2017 Goal Status: Active Interventions: Provide education on orientation to the wound center Notes: ` Venous Leg Ulcer Nursing Diagnoses: Potential for venous Insuffiency (use before diagnosis confirmed) Goals: Patient will maintain optimal edema control Date Initiated: 04/23/2017 Target Resolution Date: 05/04/2017 Goal Status: Active Interventions: Compression as ordered Treatment Activities: Therapeutic compression applied : 04/23/2017 Notes: ` Wound/Skin Impairment Nursing Diagnoses:  Impaired tissue integrity Goals: Ulcer/skin breakdown will have a volume reduction of 80% by week 12 Date Initiated: 04/23/2017 Target Resolution Date: 05/04/2017 Corey West, Corey West ([id number]) Goal Status: Active Interventions: Assess ulceration(s) every visit Notes: Electronic Signature(s) Signed: 09/27/2017 2:36:38 PM By: Roger Shelter Entered By: Roger Shelter on 09/27/2017 08:35:07 Corey West ([id number]) -------------------------------------------------------------------------------- Pain Assessment Details Patient Name: Corey West, Corey West A. Date of Service: 09/27/2017 8:15 AM Medical Record Number: [id number] Patient Account Number: [id number] Date of Birth/Sex: [birth year] (66 y.o. Male) Treating RN: Roger Shelter Primary Care Tysin Salada: [name] Other Clinician: Referring Saniyah Mondesir: [name] Treating Romayne Ticas/Extender: Frann Rider in Treatment: 22 Active Problems Location of Pain Severity and Description of Pain Patient Has Paino No Site Locations Pain Management and Medication Current Pain Management: Electronic Signature(s) Signed: 09/27/2017 2:36:38 PM By: Roger Shelter Entered By: Roger Shelter on 09/27/2017 08:19:32 Corey West ([id number]) -------------------------------------------------------------------------------- Patient/Caregiver Education Details Patient Name: Corey Cower A. Date of Service: 09/27/2017 8:15 AM Medical Record Number: [id number] Patient Account Number: [id number] Date of Birth/Gender: May 06, 1951 (66 y.o. Male) Treating RN: Roger Shelter Primary Care Physician: [name] Other Clinician: Referring Physician: [name] Treating Physician/Extender: Frann Rider in Treatment: 22 Education Assessment Education Provided To: Patient Education Topics Provided Wound/Skin Impairment: Handouts: Caring for Your Ulcer Methods: Explain/Verbal Responses: State content correctly Electronic Signature(s) Signed: 09/27/2017 2:36:38 PM By: Roger Shelter Entered By: Roger Shelter on 09/27/2017 09:19:27 Corey West ([id number]) -------------------------------------------------------------------------------- Wound Assessment Details Patient Name: Corey West, Corey West A. Date of Service: 09/27/2017 8:15 AM Medical Record Number:  [id number] Patient Account Number: [id number] Date of Birth/Sex: [birth year] (66 y.o. Male) Treating RN: Roger Shelter Primary Care Nazanin Kinner: [name] Other Clinician: Referring Makenly Larabee: [name] Treating Desaray Marschner/Extender: Frann Rider in Treatment: 22 Wound Status Wound Number: 3 Primary Lymphedema Etiology: Wound Location: Right Lower Leg - Circumfernential Secondary Diabetic Wound/Ulcer of the Lower Extremity Wounding Event: Gradually Appeared Etiology: Date Acquired: 01/14/2017 Wound Status: Open Weeks Of Treatment: 22 Comorbid Cataracts, Anemia, Sleep Apnea, Clustered Wound: No History: Hypertension, Cirrhosis , Type II Diabetes Photos Photo Uploaded By: Roger Shelter on 09/27/2017 15:28:44 Wound Measurements Length: (cm) 0.1 Width: (cm) 0.1 Depth: (cm) 0.1 Area: (cm) 0.008 Volume: (cm) 0.001 % Reduction in Area: 100% % Reduction in Volume: 100% Epithelialization: None Tunneling: No Undermining: No Wound Description Classification: Partial Thickness Wound Margin: Flat and Intact Exudate Amount: Large Exudate Type: Serous Exudate Color: amber Foul Odor After Cleansing: Yes Due to Product Use: No Slough/Fibrino Yes Wound Bed Granulation Amount: Large (67-100%) Exposed Structure Granulation Quality: Pink Fascia Exposed: No Necrotic Amount: None Present (0%) Fat Layer (Subcutaneous Tissue) Exposed: No Tendon Exposed: No Muscle Exposed: No Joint Exposed: No Bone Exposed: No Periwound Skin Texture Corey West, SEIBER A. ([id number]) Texture Color No Abnormalities Noted: No No Abnormalities Noted: No Callus: No Atrophie Blanche: No Crepitus: No Cyanosis: No Excoriation: No Ecchymosis: No Induration: No Erythema: No Rash: No Hemosiderin Staining: No Scarring: No Mottled: No Pallor: No Moisture Rubor: No No Abnormalities Noted: No Dry / Scaly: No Temperature / Pain Maceration: Yes Temperature: No Abnormality Tenderness on  Palpation: Yes Wound Preparation Ulcer Cleansing: Rinsed/Irrigated with Saline Topical Anesthetic Applied: None Treatment Notes Wound #3 (Right, Circumferential Lower Leg) 1. Cleansed with: Clean wound with Normal Saline Cleanse wound with antibacterial soap and water 2. Anesthetic Topical Lidocaine 4% cream to wound bed prior to debridement 3. Peri-wound Care: Moisturizing lotion 4. Dressing Applied: Other dressing (specify in notes) 7. Secured with 3 Layer Compression System -  Bilateral Notes silvercell and xtrasorb Electronic Signature(s) Signed: 09/27/2017 2:36:38 PM By: Roger Shelter Entered By: Roger Shelter on 09/27/2017 08:31:38 Corey West, Corey West ([id number]) -------------------------------------------------------------------------------- Wound Assessment Details Patient Name: Corey West, Corey West A. Date of Service: 09/27/2017 8:15 AM Medical Record Number: [id number] Patient Account Number: [id number] Date of Birth/Sex: 10-Jan-1951 (66 y.o. Male) Treating RN: Roger Shelter Primary Care Folashade Gamboa: [name] Other Clinician: Referring Britny Riel: [name] Treating Angela Vazguez/Extender: Frann Rider in Treatment: 22 Wound Status Wound Number: 5 Primary Lymphedema Etiology: Wound Location: Left Lower Leg - Anterior Wound Open Wounding Event: Gradually Appeared Status: Date Acquired: 07/02/2017 Comorbid Cataracts, Anemia, Sleep Apnea, Weeks Of Treatment: 12 History: Hypertension, Cirrhosis , Type II Diabetes Clustered Wound: Yes Photos Photo Uploaded By: Roger Shelter on 09/27/2017 15:28:44 Wound Measurements Length: (cm) 0.1 Width: (cm) 0.1 Depth: (cm) 0.1 Clustered Quantity: 2 Area: (cm) 0.008 Volume: (cm) 0.001 % Reduction in Area: 99.9% % Reduction in Volume: 99.9% Epithelialization: None Tunneling: No Undermining: No Wound Description Classification: Partial Thickness Wound Margin: Distinct, outline attached Exudate Amount:  Large Exudate Type: Serous Exudate Color: amber Foul Odor After Cleansing: No Slough/Fibrino Yes Wound Bed Granulation Amount: Large (67-100%) Granulation Quality: Pink Necrotic Amount: None Present (0%) Periwound Skin Texture Texture Color No Abnormalities Noted: No No Abnormalities Noted: No Callus: No Atrophie Blanche: No LAYKEN, DOENGES ([id number]) Crepitus: No Cyanosis: No Excoriation: No Ecchymosis: No Induration: No Erythema: No Rash: No Hemosiderin Staining: No Scarring: No Mottled: No Pallor: No Moisture Rubor: No No Abnormalities Noted: No Dry / Scaly: Yes Temperature / Pain Maceration: No Temperature: No Abnormality Tenderness on Palpation: Yes Wound Preparation Ulcer Cleansing: Rinsed/Irrigated with Saline Topical Anesthetic Applied: None Treatment Notes Wound #5 (Left, Anterior Lower Leg) 1. Cleansed with: Clean wound with Normal Saline Cleanse wound with antibacterial soap and water 2. Anesthetic Topical Lidocaine 4% cream to wound bed prior to debridement 3. Peri-wound Care: Moisturizing lotion 4. Dressing Applied: Other dressing (specify in notes) 7. Secured with 3 Layer Compression System - Bilateral Notes silvercell and xtrasorb Electronic Signature(s) Signed: 09/27/2017 2:36:38 PM By: Roger Shelter Entered By: Roger Shelter on 09/27/2017 08:32:08 XAN, INGRAHAM ([id number]) -------------------------------------------------------------------------------- [place] Details Patient Name: Corey Cower A. Date of Service: 09/27/2017 8:15 AM Medical Record Number: [id number] Patient Account Number: [id number] Date of Birth/Sex: [birth year] (66 y.o. Male) Treating RN: Roger Shelter Primary Care Zarion Oliff: [name] Other Clinician: Referring Brayleigh Rybacki: [name] Treating Yehudis Monceaux/Extender: Frann Rider in Treatment: 22 Vital Signs Time Taken: 08:19 Temperature (F): 98.1 Height (in): 69 Pulse (bpm): 68 Weight (lbs):  305.4 Respiratory Rate (breaths/min): 20 Body Mass Index (BMI): 45.1 Blood Pressure (mmHg): 159/58 Reference Range: 80 - 120 mg / dl Electronic Signature(s) Signed: 09/27/2017 2:36:38 PM By: Roger Shelter Entered By: Roger Shelter on 09/27/2017 02:58:52

## 2017-10-02 ENCOUNTER — Encounter: Payer: Medicare Other | Attending: Internal Medicine | Admitting: Internal Medicine

## 2017-10-02 DIAGNOSIS — L97211 Non-pressure chronic ulcer of right calf limited to breakdown of skin: Secondary | ICD-10-CM | POA: Diagnosis not present

## 2017-10-02 DIAGNOSIS — Z6841 Body Mass Index (BMI) 40.0 and over, adult: Secondary | ICD-10-CM | POA: Insufficient documentation

## 2017-10-02 DIAGNOSIS — D649 Anemia, unspecified: Secondary | ICD-10-CM | POA: Insufficient documentation

## 2017-10-02 DIAGNOSIS — F17290 Nicotine dependence, other tobacco product, uncomplicated: Secondary | ICD-10-CM | POA: Insufficient documentation

## 2017-10-02 DIAGNOSIS — I129 Hypertensive chronic kidney disease with stage 1 through stage 4 chronic kidney disease, or unspecified chronic kidney disease: Secondary | ICD-10-CM | POA: Diagnosis not present

## 2017-10-02 DIAGNOSIS — I890 Lymphedema, not elsewhere classified: Secondary | ICD-10-CM | POA: Insufficient documentation

## 2017-10-02 DIAGNOSIS — N189 Chronic kidney disease, unspecified: Secondary | ICD-10-CM | POA: Diagnosis not present

## 2017-10-02 DIAGNOSIS — E1122 Type 2 diabetes mellitus with diabetic chronic kidney disease: Secondary | ICD-10-CM | POA: Insufficient documentation

## 2017-10-02 DIAGNOSIS — I899 Noninfective disorder of lymphatic vessels and lymph nodes, unspecified: Secondary | ICD-10-CM | POA: Insufficient documentation

## 2017-10-02 DIAGNOSIS — K7460 Unspecified cirrhosis of liver: Secondary | ICD-10-CM | POA: Diagnosis not present

## 2017-10-02 DIAGNOSIS — Z794 Long term (current) use of insulin: Secondary | ICD-10-CM | POA: Diagnosis not present

## 2017-10-02 DIAGNOSIS — I87331 Chronic venous hypertension (idiopathic) with ulcer and inflammation of right lower extremity: Secondary | ICD-10-CM | POA: Diagnosis not present

## 2017-10-02 DIAGNOSIS — E11622 Type 2 diabetes mellitus with other skin ulcer: Secondary | ICD-10-CM | POA: Diagnosis not present

## 2017-10-02 DIAGNOSIS — G4733 Obstructive sleep apnea (adult) (pediatric): Secondary | ICD-10-CM | POA: Diagnosis not present

## 2017-10-03 ENCOUNTER — Encounter: Payer: Self-pay | Admitting: *Deleted

## 2017-10-03 NOTE — Progress Notes (Signed)
RONEL, RODEHEAVER ([id number]) Visit Report for 10/02/2017 Arrival Information Details Patient Name: Corey West, Corey West. Date of Service: 10/02/2017 4:00 PM Medical Record Number: [id number] Patient Account Number: [id number] Date of Birth/Sex: Mar 04, 1951 (67 y.o. Male) Treating RN: Montey Hora Primary Care Kamoni Gentles: [name] Other Clinician: Referring Karlton Maya: [name] Treating Georgianna Band/Extender: Tito Dine in Treatment: 23 Visit Information History Since Last Visit Added or deleted any medications: No Patient Arrived: Cane Any new allergies or adverse reactions: No Arrival Time: 16:16 Had a fall or experienced change in No Accompanied By: self activities of daily living that may affect Transfer Assistance: None risk of falls: Patient Identification Verified: Yes Signs or symptoms of abuse/neglect since last visito No Secondary Verification Process Completed: Yes Hospitalized since last visit: No Patient Requires Transmission-Based Precautions: No Has Dressing in Place as Prescribed: Yes Patient Has Alerts: No Has Compression in Place as Prescribed: Yes Pain Present Now: No Electronic Signature(s) Signed: 10/02/2017 5:10:34 PM By: Montey Hora Entered By: Montey Hora on 10/02/2017 16:19:23 [name] ([id number]) -------------------------------------------------------------------------------- Clinic Level of Care Assessment Details Patient Name: [name] Date of Service: 10/02/2017 4:00 PM Medical Record Number: [id number] Patient Account Number: [id number] Date of Birth/Sex: [birth year] (67 y.o. Male) Treating RN: Montey Hora Primary Care Micahel Omlor: [name] Other Clinician: Referring Desa Rech: [name] Treating Darletta Noblett/Extender: Tito Dine in Treatment: 23 Clinic Level of Care Assessment Items TOOL 4 Quantity Score []  - Use when only an EandM is performed on FOLLOW-UP visit 0 ASSESSMENTS - Nursing Assessment /  Reassessment X - Reassessment of Co-morbidities (includes updates in patient status) 1 10 X- 1 5 Reassessment of Adherence to Treatment Plan ASSESSMENTS - Wound and Skin Assessment / Reassessment []  - Simple Wound Assessment / Reassessment - one wound 0 X- 2 5 Complex Wound Assessment / Reassessment - multiple wounds []  - 0 Dermatologic / Skin Assessment (not related to wound area) ASSESSMENTS - Focused Assessment X - Circumferential Edema Measurements - multi extremities 2 5 []  - 0 Nutritional Assessment / Counseling / Intervention X- 1 5 Lower Extremity Assessment (monofilament, tuning fork, pulses) []  - 0 Peripheral Arterial Disease Assessment (using hand held doppler) ASSESSMENTS - Ostomy and/or Continence Assessment and Care []  - Incontinence Assessment and Management 0 []  - 0 Ostomy Care Assessment and Management (repouching, etc.) PROCESS - Coordination of Care X - Simple Patient / Family Education for ongoing care 1 15 []  - 0 Complex (extensive) Patient / Family Education for ongoing care []  - 0 Staff obtains Programmer, systems, Records, Test Results / Process Orders []  - 0 Staff telephones HHA, Nursing Homes / Clarify orders / etc []  - 0 Routine Transfer to another Facility (non-emergent condition) []  - 0 Routine Hospital Admission (non-emergent condition) []  - 0 New Admissions / Biomedical engineer / Ordering NPWT, Apligraf, etc. []  - 0 Emergency Hospital Admission (emergent condition) X- 1 10 Simple Discharge Coordination TOREY, REINARD ([id number]) []  - 0 Complex (extensive) Discharge Coordination PROCESS - Special Needs []  - Pediatric / Minor Patient Management 0 []  - 0 Isolation Patient Management []  - 0 Hearing / Language / Visual special needs []  - 0 Assessment of Community assistance (transportation, D/C planning, etc.) []  - 0 Additional assistance / Altered mentation []  - 0 Support Surface(s) Assessment (bed, cushion, seat, etc.) INTERVENTIONS -  Wound Cleansing / Measurement []  - Simple Wound Cleansing - one wound 0 X- 2 5 Complex Wound Cleansing - multiple wounds X- 1 5 Wound Imaging (photographs - any number  of wounds) []  - 0 Wound Tracing (instead of photographs) []  - 0 Simple Wound Measurement - one wound X- 2 5 Complex Wound Measurement - multiple wounds INTERVENTIONS - Wound Dressings []  - Small Wound Dressing one or multiple wounds 0 []  - 0 Medium Wound Dressing one or multiple wounds []  - 0 Large Wound Dressing one or multiple wounds []  - 0 Application of Medications - topical []  - 0 Application of Medications - injection INTERVENTIONS - Miscellaneous []  - External ear exam 0 []  - 0 Specimen Collection (cultures, biopsies, blood, body fluids, etc.) []  - 0 Specimen(s) / Culture(s) sent or taken to Lab for analysis []  - 0 Patient Transfer (multiple staff / Civil Service fast streamer / Similar devices) []  - 0 Simple Staple / Suture removal (25 or less) []  - 0 Complex Staple / Suture removal (26 or more) []  - 0 Hypo / Hyperglycemic Management (close monitor of Blood Glucose) []  - 0 Ankle / Brachial Index (ABI) - do not check if billed separately X- 1 5 Vital Signs KITAI, PURDOM ([id number]) Has the patient been seen at the hospital within the last three years: Yes Total Score: 95 Level Of Care: New/Established - Level 3 Electronic Signature(s) Signed: 10/02/2017 5:10:34 PM By: Montey Hora Entered By: Montey Hora on 10/02/2017 17:02:01 [name] ([id number]) -------------------------------------------------------------------------------- Encounter Discharge Information Details Patient Name: [name] Date of Service: 10/02/2017 4:00 PM Medical Record Number: [id number] Patient Account Number: [id number] Date of Birth/Sex: 09-09-1951 (67 y.o. Male) Treating RN: Montey Hora Primary Care Jiaire Rosebrook: [name] Other Clinician: Referring [name]: [name] Treating Jaymarion Trombly/Extender: Tito Dine in Treatment: 46 Encounter Discharge Information Items Discharge Pain Level: 0 Discharge Condition: Stable Ambulatory Status: Cane Discharge Destination: Home Transportation: Private Auto Accompanied By: self Schedule Follow-up Appointment: No Medication Reconciliation completed and No provided to Patient/Care Samiah Ricklefs: Patient Clinical Summary of Care: Declined Electronic Signature(s) Signed: 10/02/2017 5:02:19 PM By: Montey Hora Entered By: Montey Hora on 10/02/2017 17:02:19 [name] ([id number]) -------------------------------------------------------------------------------- Lower Extremity Assessment Details Patient Name: [name] Date of Service: 10/02/2017 4:00 PM Medical Record Number: [id number] Patient Account Number: [id number] Date of Birth/Sex: 15-Oct-1950 (67 y.o. Male) Treating RN: Montey Hora Primary Care Diavion Labrador: [name] Other Clinician: Referring Earl Losee: [name] Treating Keta [place]/Extender: Tito Dine in Treatment: 23 Edema Assessment Assessed: [Left: No] [Right: No] [Left: Edema] [Right: :] Calf Left: Right: Point of Measurement: 32 cm From Medial Instep 37.1 cm 39.8 cm Ankle Left: Right: Point of Measurement: 13 cm From Medial Instep 25 cm 26.2 cm Vascular Assessment Pulses: Dorsalis Pedis Palpable: [Left:Yes] [Right:Yes] Posterior Tibial Extremity colors, hair growth, and conditions: Extremity Color: [Left:Hyperpigmented] [Right:Hyperpigmented] Hair Growth on Extremity: [Left:No] [Right:No] Temperature of Extremity: [Left:Warm] [Right:Warm] Capillary Refill: [Left:< 3 seconds] [Right:< 3 seconds] Toe Nail Assessment Left: Right: Thick: Yes Yes Discolored: Yes Yes Deformed: Yes Yes Improper Length and Hygiene: Yes Yes Electronic Signature(s) Signed: 10/02/2017 5:10:34 PM By: Montey Hora Entered By: Montey Hora on 10/02/2017 16:25:26 [name]  ([id number]) -------------------------------------------------------------------------------- Multi Wound Chart Details Patient Name: [name] Date of Service: 10/02/2017 4:00 PM Medical Record Number: [id number] Patient Account Number: [id number] Date of Birth/Sex: [birth year] (67 y.o. Male) Treating RN: Montey Hora Primary Care Kailand Seda: [name] Other Clinician: Referring Lenoria Narine: [name] Treating Shellene Sweigert/Extender: Tito Dine in Treatment: 23 Vital Signs Height(in): 69 Pulse(bpm): 79 Weight(lbs): 305.4 Blood Pressure(mmHg): 148/58 Body Mass Index(BMI): 45 Temperature(F): 98.2 Respiratory Rate 18 (breaths/min): Photos: [N/A:N/A] Wound Location:  Right, Circumferential Lower Left, Anterior Lower Leg N/A Leg Wounding Event: Gradually Appeared Gradually Appeared N/A Primary Etiology: Lymphedema Lymphedema N/A Secondary Etiology: Diabetic Wound/Ulcer of the N/A N/A Lower Extremity Date Acquired: 01/14/2017 07/02/2017 N/A Weeks of Treatment: 23 13 N/A Wound Status: Open Open N/A Clustered Wound: No Yes N/A Measurements L x W x D 0x0x0 0x0x0 N/A (cm) Area (cm) : 0 0 N/A Volume (cm) : 0 0 N/A % Reduction in Area: 100.00% 100.00% N/A % Reduction in Volume: 100.00% 100.00% N/A Classification: Partial Thickness Partial Thickness N/A Periwound Skin Texture: No Abnormalities Noted No Abnormalities Noted N/A Periwound Skin Moisture: No Abnormalities Noted No Abnormalities Noted N/A Periwound Skin Color: No Abnormalities Noted No Abnormalities Noted N/A Tenderness on Palpation: No No N/A Treatment Notes Electronic Signature(s) Signed: 10/02/2017 5:48:27 PM By: Linton Ham MD Previous Signature: 10/02/2017 5:01:07 PM Version By: Marti Sleigh ([id number]) Entered By: Linton Ham on 10/02/2017 17:43:51 [name] ([id number]) -------------------------------------------------------------------------------- Multi-Disciplinary  Care Plan Details Patient Name: JEOFFREY, ELEAZER A. Date of Service: 10/02/2017 4:00 PM Medical Record Number: [id number] Patient Account Number: [id number] Date of Birth/Sex: 14-Oct-1950 (67 y.o. Male) Treating RN: Montey Hora Primary Care Merie Wulf: [name] Other Clinician: Referring Kaydan Wong: [name] Treating Sharmeka Palmisano/Extender: Tito Dine in Treatment: 24 Active Inactive ` Orientation to the Wound Care Program Nursing Diagnoses: Knowledge deficit related to the wound healing center program Goals: Patient/caregiver will verbalize understanding of the [facility] Program Date Initiated: 04/23/2017 Target Resolution Date: 05/04/2017 Goal Status: Active Interventions: Provide education on orientation to the wound center Notes: ` Venous Leg Ulcer Nursing Diagnoses: Potential for venous Insuffiency (use before diagnosis confirmed) Goals: Patient will maintain optimal edema control Date Initiated: 04/23/2017 Target Resolution Date: 05/04/2017 Goal Status: Active Interventions: Compression as ordered Treatment Activities: Therapeutic compression applied : 04/23/2017 Notes: ` Wound/Skin Impairment Nursing Diagnoses: Impaired tissue integrity Goals: Ulcer/skin breakdown will have a volume reduction of 80% by week 12 Date Initiated: 04/23/2017 Target Resolution Date: 05/04/2017 GEN, CLAGG ([id number]) Goal Status: Active Interventions: Assess ulceration(s) every visit Notes: Electronic Signature(s) Signed: 10/02/2017 5:00:58 PM By: Montey Hora Entered By: Montey Hora on 10/02/2017 17:00:58 [name] ([id number]) -------------------------------------------------------------------------------- Pain Assessment Details Patient Name: [name] Date of Service: 10/02/2017 4:00 PM Medical Record Number: [id number] Patient Account Number: [id number] Date of Birth/Sex: 1950/12/29 (67 y.o. Male) Treating RN: Montey Hora Primary Care Lashan Gluth:  [name] Other Clinician: Referring Dahlton Hinde: [name] Treating Daviana Haymaker/Extender: Tito Dine in Treatment: 23 Active Problems Location of Pain Severity and Description of Pain Patient Has Paino No Site Locations Pain Management and Medication Current Pain Management: Notes Topical or injectable lidocaine is offered to patient for acute pain when surgical debridement is performed. If needed, Patient is instructed to use over the counter pain medication for the following 24-48 hours after debridement. Wound care MDs do not prescribed pain medications. Patient has chronic pain or uncontrolled pain. Patient has been instructed to make an appointment with their Primary Care Physician for pain management. Electronic Signature(s) Signed: 10/02/2017 5:10:34 PM By: Montey Hora Entered By: Montey Hora on 10/02/2017 16:19:35 [name] ([id number]) -------------------------------------------------------------------------------- Patient/Caregiver Education Details Patient Name: JOENATHAN, SAKUMA A. Date of Service: 10/02/2017 4:00 PM Medical Record Number: [id number] Patient Account Number: [id number] Date of Birth/Gender: 1951/02/23 (67 y.o. Male) Treating RN: Montey Hora Primary Care Physician: [name] Other Clinician: Referring Physician: [name] Treating Physician/Extender: Tito Dine in Treatment: 68 Education Assessment Education Provided  To: Patient Education Topics Provided Venous: Handouts: Other: wear compression daily Methods: Explain/Verbal Responses: State content correctly Electronic Signature(s) Signed: 10/02/2017 5:10:34 PM By: Montey Hora Entered By: Montey Hora on 10/02/2017 17:02:34 [name] ([id number]) -------------------------------------------------------------------------------- Wound Assessment Details Patient Name: [name] Date of Service: 10/02/2017 4:00 PM Medical Record Number:  [id number] Patient Account Number: [id number] Date of Birth/Sex: 01-Mar-1951 (67 y.o. Male) Treating RN: Montey Hora Primary Care Serinity Ware: [name] Other Clinician: Referring Alp Goldwater: [name] Treating Cheryle Dark/Extender: Tito Dine in Treatment: 23 Wound Status Wound Number: 3 Primary Etiology: Lymphedema Wound Location: Right, Circumferential Lower Leg Secondary Diabetic Wound/Ulcer of the Lower Etiology: Extremity Wounding Event: Gradually Appeared Wound Status: Open Date Acquired: 01/14/2017 Weeks Of Treatment: 23 Clustered Wound: No Photos Photo Uploaded By: Montey Hora on 10/02/2017 16:40:30 Wound Measurements Length: (cm) 0 % Width: (cm) 0 % Depth: (cm) 0 Area: (cm) 0 Volume: (cm) 0 Reduction in Area: 100% Reduction in Volume: 100% Wound Description Classification: Partial Thickness Periwound Skin Texture Texture Color No Abnormalities Noted: No No Abnormalities Noted: No Moisture No Abnormalities Noted: No Electronic Signature(s) Signed: 10/02/2017 5:10:34 PM By: Montey Hora Entered By: Montey Hora on 10/02/2017 16:25:41 [name] ([id number]) -------------------------------------------------------------------------------- Wound Assessment Details Patient Name: [name] Date of Service: 10/02/2017 4:00 PM Medical Record Number: [id number] Patient Account Number: [id number] Date of Birth/Sex: 1951-07-19 (67 y.o. Male) Treating RN: Montey Hora Primary Care Azeez Dunker: [name] Other Clinician: Referring Kallon Caylor: [name] Treating Sinthia Karabin/Extender: Tito Dine in Treatment: 23 Wound Status Wound Number: 5 Primary Etiology: Lymphedema Wound Location: Left, Anterior Lower Leg Wound Status: Open Wounding Event: Gradually Appeared Date Acquired: 07/02/2017 Weeks Of Treatment: 13 Clustered Wound: Yes Photos Photo Uploaded By: Montey Hora on 10/02/2017 16:40:31 Wound Measurements Length:  (cm) 0 % Width: (cm) 0 % Depth: (cm) 0 Area: (cm) 0 Volume: (cm) 0 Reduction in Area: 100% Reduction in Volume: 100% Wound Description Classification: Partial Thickness Periwound Skin Texture Texture Color No Abnormalities Noted: No No Abnormalities Noted: No Moisture No Abnormalities Noted: No Electronic Signature(s) Signed: 10/02/2017 5:10:34 PM By: Montey Hora Entered By: Montey Hora on 10/02/2017 16:25:41 [name] ([id number]) -------------------------------------------------------------------------------- [place] Details Patient Name: [name] Date of Service: 10/02/2017 4:00 PM Medical Record Number: [id number] Patient Account Number: [id number] Date of Birth/Sex: 1950-11-22 (67 y.o. Male) Treating RN: Montey Hora Primary Care Ashira Kirsten: [name] Other Clinician: Referring Khoa Opdahl: [name] Treating Kainan Patty/Extender: Tito Dine in Treatment: 23 Vital Signs Time Taken: 16:20 Temperature (F): 98.2 Height (in): 69 Pulse (bpm): 79 Weight (lbs): 305.4 Respiratory Rate (breaths/min): 18 Body Mass Index (BMI): 45.1 Blood Pressure (mmHg): 148/58 Reference Range: 80 - 120 mg / dl Electronic Signature(s) Signed: 10/02/2017 5:10:34 PM By: Montey Hora Entered By: Montey Hora on 10/02/2017 16:21:19

## 2017-10-03 NOTE — Progress Notes (Signed)
MIKKO, LEWELLEN ([id number]) Visit Report for 10/02/2017 HPI Details Patient Name: Corey West, Corey West. Date of Service: 10/02/2017 4:00 PM Medical Record Number: [id number] Patient Account Number: [id number] Date of Birth/Sex: 07-Jun-1951 (67 y.o. Male) Treating RN: Montey Hora Primary Care Provider: [name] Other Clinician: Referring Provider: [name] Treating Provider/Extender: Tito Dine in Treatment: 23 History of Present Illness HPI Description: Pleasant 67 year old with a past medical history significant for morbid obesity, insulin-dependent type 2 diabetes (hemoglobin A1c 7.2 in January 2015), cirrhosis, and chronic kidney disease. He reports a left anterior calf ulcer since January 2015. He has had venous stasis ulcers in the past, which have healed spontaneously. No history of DVT. Does not regularly wear compression. Ambulating normally per his baseline. Recently completed a course of antibiotics. Treated with a Profore light and Coban 2 layer compression drainage, which both fell down and he cut off. Tolerated Coban lite compression bandage. Returns to clinic for followup. No complaints today. No pain at this time. No fever or chills. No open wounds at this time. He is having difficulty finding compression garments that fit him. 04/23/17; this is now 67 year old man who returns to our clinic for nonhealing wounds on the right lateral and right posterior calf. He tells me that they have been present for 6 months. In the setting of severe right greater than left lower extremity lymphedema likely secondary to chronic venous insufficiency. He has been followed in the lymphedema clinic at least since early June with compression wraps although these have not really helped and the patient is here for our review of this situation. The patient has had problems with edema in his legs for years. He has not worn compression. He is type II diabetic that sounds  poorly controlled. He also has obstructive sleep apnea and cirrhosis. He is occasional cigar smoker 04/30/17; patient is a 67 year old man who has severe bilateral lower extremity lymphedema stage III since his early 59s he tells me. This is no doubt secondary to chronic venous insufficiency. He was going to the lymphedema clinic who forwarded him here due to skin breakdown in the right leg. He has not previously worn compression stockings. He has a large area of denuded epithelium on the posterior leg. He has a more open wound on the right lateral leg 05/07/17 on evaluation today patient appears to be doing about the same his wounds are showing signs of improvement and looking fairly well. With that being said he is not having any severe discomfort just some mild aching due to the swelling and really this is worse on the left than the right and the right side is where he actually has his wounds. He has been tolerating the [employer] wraps without complication and states this is actually much better than the wraps that he was getting at the lymphedema clinic most recently. He is happy with these wraps. He has no nausea, vomiting, diarrhea and no fevers or chills. We are still waiting to hear back in regard to the lymphedema pumps which I do think would be beneficial for him even with the compression wraps he still has a significant amount of edema noted of the bilateral lower extremities. 05/15/17 on evaluation today patient's right lower extremity edema and weeping especially the posterior portion appears to be doing better. He still is having some issue here but fortunately it's minimal compared to previous weeks. He continues to tolerate the compression fairly well. Overall he is pleased with how things are progressing.  No fevers, chills, nausea, or vomiting noted at this time. And patient is having only minimal discomfort. 05/22/17; the medial wound on the right leg actually looks considerably better  and is epithelialized. He still has a small punched out area on the right lateral leg. Edema control is marginal end of the [employer]. We are working on getting him compression pumps 05/29/17; the patient's lymphedema pumps are apparently arriving tonight. In the meantime he took his Unna boots off last Sunday because the edema had progressed to the point that a word to titrate. Since then he has put nothing on his right leg and more specifically nothing on the right medial and lateral wounds 06/05/17; the patient has his lymphedema pumps and he is using them once a day on the right leg. There is a sizable reduction in the circumference of his leg although he arrived in clinic today somewhat short of breath. 07/02/17; since the patient was last in clinic he was admitted on 2 occasions. He was admitted from 9/12 through 9/13 with MEKEL, HAVERSTOCK A. ([id number]) lactic acidosis weakness and felt to have left lower extremity cellulitis. Due to the elevated lactic acid level he was felt to have sepsis although this was ruled out and the lactic acidosis was felt secondary to his chronic cirrhosis. He was initially treated with IV vancomycin for cellulitis and redness of his left leg but then this was subsequently recognized to be secondary to chronic venous stasis and chronic lymphedema. When he left the hospital he had Unna boots placed bilaterally. He was readmitted on 9/26 through 9/28 with GI bleeding. His EGD showed Barrett's esophagus and gastritis. He was discharged with a hemoglobin of 8 to follow-up with GI as an outpatient. There was nothing more about his lower extremities during this short hospital stay. The patient has not been using his external compression pumps fearing fluid overload and shortness of breath although he feels better now that his hemoglobin is stabilized. He continues to have epithelial loss on his bilateral lower extremities. 07/09/17; patient has a large but superficial area on  the right posterior leg and on the left anterior leg. He tells me he is been using his external compression pumps once a day for the required hour. He states the pumps hurt his right ankle. 07/17/17; patient was transfused a further 2 units since he was here the last time. His hemoglobin was once again found to be low. He has not been able to use his external compression pumps because of a combination of prolonged power outage in his home and I think issues with his other medical care. His wounds have actually worsened he now has loss of epithelialization all the way from the right lateral to the right medial posteriorly. The left side which has anterior loss of epithelialization also appears to be worse on the lateral aspect of the leg where there is denuded epithelium now as well. Leaking edema fluid. He has bilateral lymphedema. 08/12/2017 -- the patient has not been here for 25 days and has had the same compression wraps which have not been changed!! He says he was being worked up for a GI bleed and had some transfusions since he was seen last by Dr. Dellia Nims 08/20/17; mostly by review of what the patient is saying and a quick glance at his records in care everywhere I think this patient probably has Karlene Lineman, cirrhosis and portal hypertension. He is bleeding I think from gastric area and he is requiring recurrent transfusions. He  has been told that there is nothing more that can be done and that his prognosis is poor. He arrived in our office in our office in tears. We've been using silver alginate and 3 layer compression to his bilateral lower extremity wounds. He has significant lymphedema likely secondary to chronic venous insufficiency. He has external compression pumps at home that he had actually before he came to this clinic but he is not using 08/27/17; patient is doing well, wounds on his left leg are tiny one anteriorly and one laterally. He still has substantial wound on the posterior  aspect of his right calf extending laterally for over this looks better than last time. He tells me he is able to use his compression pumps once a day on most days. He has his hemoglobin checked on Friday 09-19-17 He arrives today for follow up evaluation. He was unable to come to the clinic last week r/t weather conditions, therefore compression has been in place for two weeks. He presents today with healed LLE and one partial thickness open area to the posterior RL. He states he is experiencing diarrhea secondary to antibiotic that will be stopped next week and has been unable to use lymphedema pumps. Will continue with compression bilaterally, follow up next week with plans to discharge and transition to lymphedema pumps 10/02/17; the patient arrives today with his juxta light stockings bilaterally. He also has external compression pumps at home. There are no open areas on his bilateral legs. He tells me he has had adjustments in his diuretics and had resultant hypokalemia but the amount of edema in his legs is a lot better although he stillhas changes of lymphedema Electronic Signature(s) Signed: 10/02/2017 5:48:27 PM By: Linton Ham MD Entered By: Linton Ham on 10/02/2017 17:45:02 [name] ([id number]) -------------------------------------------------------------------------------- Physical Exam Details Patient Name: [name] Date of Service: 10/02/2017 4:00 PM Medical Record Number: [id number] Patient Account Number: [id number] Date of Birth/Sex: 1951/02/07 (67 y.o. Male) Treating RN: Montey Hora Primary Care Provider: [name] Other Clinician: Referring Provider: [name] Treating Provider/Extender: Tito Dine in Treatment: 1 Constitutional Patient is hypertensive.. Pulse regular and within target range for patient.Marland Kitchen Respirations regular, non-labored and within target range.. Temperature is normal and within the target range for the patient.Marland Kitchen  appears in no distress. Eyes Conjunctivae clear. No discharge. Respiratory Respiratory effort is easy and symmetric bilaterally. Rate is normal at rest and on room air.. Cardiovascular much better edema control. Lymphatic not palpable in the popliteal or inguinal area. Integumentary (Hair, Skin) chronic skin changes were related to chronic lymphedema but this looks stable. Notes wound exam; the patient has no open areas on either one of his legs. No weeping edema no erythema. We will transition him to his juxta light stockings and continue with his external compression pumps at home as well as his skin lubrication Electronic Signature(s) Signed: 10/02/2017 5:48:27 PM By: Linton Ham MD Entered By: Linton Ham on 10/02/2017 17:46:14 [name] ([id number]) -------------------------------------------------------------------------------- Physician Orders Details Patient Name: HAMISH, BANKS A. Date of Service: 10/02/2017 4:00 PM Medical Record Number: [id number] Patient Account Number: [id number] Date of Birth/Sex: November 30, 1950 (67 y.o. Male) Treating RN: Montey Hora Primary Care Provider: [name] Other Clinician: Referring Provider: [name] Treating Provider/Extender: Tito Dine in Treatment: 88 Verbal / Phone Orders: No Diagnosis Coding Discharge From [facility] o Discharge from [facility] juxtalite daily from morning until bedtime Electronic Signature(s) Signed: 10/02/2017 5:01:33 PM By: Marjory Lies,  Di Kindle Signed: 10/02/2017 5:48:27 PM By: Linton Ham MD Entered By: Montey Hora on 10/02/2017 17:01:33 [name] ([id number]) -------------------------------------------------------------------------------- Problem List Details Patient Name: LENN, VOLKER A. Date of Service: 10/02/2017 4:00 PM Medical Record Number: [id number] Patient Account Number: [id number] Date of Birth/Sex: 1950-12-31 (66 y.o. Male) Treating RN: Montey Hora Primary Care Provider: [name] Other Clinician: Referring Provider: [name] Treating Provider/Extender: Tito Dine in Treatment: 23 Active Problems ICD-10 Encounter Code Description Active Date Diagnosis I89.0 Lymphedema, not elsewhere classified 04/23/2017 Yes L97.211 Non-pressure chronic ulcer of right calf limited to breakdown of skin 04/23/2017 Yes I87.331 Chronic venous hypertension (idiopathic) with ulcer and 04/23/2017 Yes inflammation of right lower extremity E11.622 Type 2 diabetes mellitus with other skin ulcer 04/23/2017 Yes I89.9 Noninfective disorder of lymphatic vessels and lymph nodes, 04/23/2017 Yes unspecified Inactive Problems Resolved Problems Electronic Signature(s) Signed: 10/02/2017 5:48:27 PM By: Linton Ham MD Entered By: Linton Ham on 10/02/2017 17:43:38 [name] ([id number]) -------------------------------------------------------------------------------- Progress Note Details Patient Name: [name] Date of Service: 10/02/2017 4:00 PM Medical Record Number: [id number] Patient Account Number: [id number] Date of Birth/Sex: 28-May-1951 (67 y.o. Male) Treating RN: Montey Hora Primary Care Provider: [name] Other Clinician: Referring Provider: [name] Treating Provider/Extender: Tito Dine in Treatment: 23 Subjective History of Present Illness (HPI) Pleasant 67 year old with a past medical history significant for morbid obesity, insulin-dependent type 2 diabetes (hemoglobin A1c 7.2 in January 2015), cirrhosis, and chronic kidney disease. He reports a left anterior calf ulcer since January 2015. He has had venous stasis ulcers in the past, which have healed spontaneously. No history of DVT. Does not regularly wear compression. Ambulating normally per his baseline. Recently completed a course of antibiotics. Treated with a Profore light and Coban 2 layer compression drainage, which both  fell down and he cut off. Tolerated Coban lite compression bandage. Returns to clinic for followup. No complaints today. No pain at this time. No fever or chills. No open wounds at this time. He is having difficulty finding compression garments that fit him. 04/23/17; this is now 67 year old man who returns to our clinic for nonhealing wounds on the right lateral and right posterior calf. He tells me that they have been present for 6 months. In the setting of severe right greater than left lower extremity lymphedema likely secondary to chronic venous insufficiency. He has been followed in the lymphedema clinic at least since early June with compression wraps although these have not really helped and the patient is here for our review of this situation. The patient has had problems with edema in his legs for years. He has not worn compression. He is type II diabetic that sounds poorly controlled. He also has obstructive sleep apnea and cirrhosis. He is occasional cigar smoker 04/30/17; patient is a 67 year old man who has severe bilateral lower extremity lymphedema stage III since his early 51s he tells me. This is no doubt secondary to chronic venous insufficiency. He was going to the lymphedema clinic who forwarded him here due to skin breakdown in the right leg. He has not previously worn compression stockings. He has a large area of denuded epithelium on the posterior leg. He has a more open wound on the right lateral leg 05/07/17 on evaluation today patient appears to be doing about the same his wounds are showing signs of improvement and looking fairly well. With that being said he is not having any severe discomfort just some mild aching due to the swelling  and really this is worse on the left than the right and the right side is where he actually has his wounds. He has been tolerating the [employer] wraps without complication and states this is actually much better than the wraps that he was  getting at the lymphedema clinic most recently. He is happy with these wraps. He has no nausea, vomiting, diarrhea and no fevers or chills. We are still waiting to hear back in regard to the lymphedema pumps which I do think would be beneficial for him even with the compression wraps he still has a significant amount of edema noted of the bilateral lower extremities. 05/15/17 on evaluation today patient's right lower extremity edema and weeping especially the posterior portion appears to be doing better. He still is having some issue here but fortunately it's minimal compared to previous weeks. He continues to tolerate the compression fairly well. Overall he is pleased with how things are progressing. No fevers, chills, nausea, or vomiting noted at this time. And patient is having only minimal discomfort. 05/22/17; the medial wound on the right leg actually looks considerably better and is epithelialized. He still has a small punched out area on the right lateral leg. Edema control is marginal end of the [employer]. We are working on getting him compression pumps 05/29/17; the patient's lymphedema pumps are apparently arriving tonight. In the meantime he took his Unna boots off last Sunday because the edema had progressed to the point that a word to titrate. Since then he has put nothing on his right leg and more specifically nothing on the right medial and lateral wounds 06/05/17; the patient has his lymphedema pumps and he is using them once a day on the right leg. There is a sizable reduction in the circumference of his leg although he arrived in clinic today somewhat short of breath. 07/02/17; since the patient was last in clinic he was admitted on 2 occasions. He was admitted from 9/12 through 9/13 with lactic acidosis weakness and felt to have left lower extremity cellulitis. Due to the elevated lactic acid level he was felt to have sepsis although this was ruled out and the lactic acidosis was felt  secondary to his chronic cirrhosis. He was initially treated MARKOS, THEIL ([id number]) with IV vancomycin for cellulitis and redness of his left leg but then this was subsequently recognized to be secondary to chronic venous stasis and chronic lymphedema. When he left the hospital he had Unna boots placed bilaterally. He was readmitted on 9/26 through 9/28 with GI bleeding. His EGD showed Barrett's esophagus and gastritis. He was discharged with a hemoglobin of 8 to follow-up with GI as an outpatient. There was nothing more about his lower extremities during this short hospital stay. The patient has not been using his external compression pumps fearing fluid overload and shortness of breath although he feels better now that his hemoglobin is stabilized. He continues to have epithelial loss on his bilateral lower extremities. 07/09/17; patient has a large but superficial area on the right posterior leg and on the left anterior leg. He tells me he is been using his external compression pumps once a day for the required hour. He states the pumps hurt his right ankle. 07/17/17; patient was transfused a further 2 units since he was here the last time. His hemoglobin was once again found to be low. He has not been able to use his external compression pumps because of a combination of prolonged power outage in  his home and I think issues with his other medical care. His wounds have actually worsened he now has loss of epithelialization all the way from the right lateral to the right medial posteriorly. The left side which has anterior loss of epithelialization also appears to be worse on the lateral aspect of the leg where there is denuded epithelium now as well. Leaking edema fluid. He has bilateral lymphedema. 08/12/2017 -- the patient has not been here for 25 days and has had the same compression wraps which have not been changed!! He says he was being worked up for a GI bleed and had some  transfusions since he was seen last by Dr. Dellia Nims 08/20/17; mostly by review of what the patient is saying and a quick glance at his records in care everywhere I think this patient probably has Karlene Lineman, cirrhosis and portal hypertension. He is bleeding I think from gastric area and he is requiring recurrent transfusions. He has been told that there is nothing more that can be done and that his prognosis is poor. He arrived in our office in our office in tears. We've been using silver alginate and 3 layer compression to his bilateral lower extremity wounds. He has significant lymphedema likely secondary to chronic venous insufficiency. He has external compression pumps at home that he had actually before he came to this clinic but he is not using 08/27/17; patient is doing well, wounds on his left leg are tiny one anteriorly and one laterally. He still has substantial wound on the posterior aspect of his right calf extending laterally for over this looks better than last time. He tells me he is able to use his compression pumps once a day on most days. He has his hemoglobin checked on Friday 09-19-17 He arrives today for follow up evaluation. He was unable to come to the clinic last week r/t weather conditions, therefore compression has been in place for two weeks. He presents today with healed LLE and one partial thickness open area to the posterior RL. He states he is experiencing diarrhea secondary to antibiotic that will be stopped next week and has been unable to use lymphedema pumps. Will continue with compression bilaterally, follow up next week with plans to discharge and transition to lymphedema pumps 10/02/17; the patient arrives today with his juxta light stockings bilaterally. He also has external compression pumps at home. There are no open areas on his bilateral legs. He tells me he has had adjustments in his diuretics and had resultant hypokalemia but the amount of edema in his legs is a  lot better although he stillhas changes of lymphedema Objective Constitutional Patient is hypertensive.. Pulse regular and within target range for patient.Marland Kitchen Respirations regular, non-labored and within target range.. Temperature is normal and within the target range for the patient.Marland Kitchen appears in no distress. Vitals Time Taken: 4:20 PM, Height: 69 in, Weight: 305.4 lbs, BMI: 45.1, Temperature: 98.2 F, Pulse: 79 bpm, Respiratory Rate: 18 breaths/min, Blood Pressure: 148/58 mmHg. Eyes Conjunctivae clear. No discharge. MASSIMILIANO, ROHLEDER ([id number]) Respiratory Respiratory effort is easy and symmetric bilaterally. Rate is normal at rest and on room air.. Cardiovascular much better edema control. Lymphatic not palpable in the popliteal or inguinal area. General Notes: wound exam; the patient has no open areas on either one of his legs. No weeping edema no erythema. We will transition him to his juxta light stockings and continue with his external compression pumps at home as well as his skin lubrication Integumentary (Hair, Skin)  chronic skin changes were related to chronic lymphedema but this looks stable. Wound #3 status is Open. Original cause of wound was Gradually Appeared. The wound is located on the Right,Circumferential Lower Leg. The wound measures 0cm length x 0cm width x 0cm depth; 0cm^2 area and 0cm^3 volume. Wound #5 status is Open. Original cause of wound was Gradually Appeared. The wound is located on the Left,Anterior Lower Leg. The wound measures 0cm length x 0cm width x 0cm depth; 0cm^2 area and 0cm^3 volume. Assessment Active Problems ICD-10 I89.0 - Lymphedema, not elsewhere classified L97.211 - Non-pressure chronic ulcer of right calf limited to breakdown of skin I87.331 - Chronic venous hypertension (idiopathic) with ulcer and inflammation of right lower extremity E11.622 - Type 2 diabetes mellitus with other skin ulcer I89.9 - Noninfective disorder of lymphatic vessels  and lymph nodes, unspecified Plan Discharge From [facility] Services: Discharge from [facility] juxtalite daily from morning until bedtime #1 the patient can be discharged from the wound care center #2 bilateral juxta light stockings, patient given instructions in their use #3 to continue with his external compression pumps daily at home HOY, FALLERT ([id number]) #4 continued skin lubrication Electronic Signature(s) Signed: 10/02/2017 5:48:27 PM By: Linton Ham MD Entered By: Linton Ham on 10/02/2017 17:46:50 [name] ([id number]) -------------------------------------------------------------------------------- SuperBill Details Patient Name: [name] Date of Service: 10/02/2017 Medical Record Number: [id number] Patient Account Number: [id number] Date of Birth/Sex: Mar 22, 1951 (67 y.o. 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

## 2017-10-09 ENCOUNTER — Other Ambulatory Visit: Payer: Self-pay

## 2017-10-09 ENCOUNTER — Ambulatory Visit: Payer: Medicare Other | Admitting: Internal Medicine

## 2017-10-09 ENCOUNTER — Emergency Department: Payer: Medicare Other

## 2017-10-09 ENCOUNTER — Encounter: Payer: Self-pay | Admitting: Emergency Medicine

## 2017-10-09 ENCOUNTER — Inpatient Hospital Stay
Admission: EM | Admit: 2017-10-09 | Discharge: 2017-10-11 | DRG: 641 | Disposition: A | Discharge status: Home / Self Care | Payer: Medicare Other | Attending: Internal Medicine | Admitting: Internal Medicine

## 2017-10-09 DIAGNOSIS — G4730 Sleep apnea, unspecified: Secondary | ICD-10-CM | POA: Diagnosis present

## 2017-10-09 DIAGNOSIS — F17290 Nicotine dependence, other tobacco product, uncomplicated: Secondary | ICD-10-CM | POA: Diagnosis present

## 2017-10-09 DIAGNOSIS — E1165 Type 2 diabetes mellitus with hyperglycemia: Secondary | ICD-10-CM | POA: Diagnosis present

## 2017-10-09 DIAGNOSIS — E876 Hypokalemia: Secondary | ICD-10-CM | POA: Diagnosis present

## 2017-10-09 DIAGNOSIS — F419 Anxiety disorder, unspecified: Secondary | ICD-10-CM | POA: Diagnosis present

## 2017-10-09 DIAGNOSIS — K7460 Unspecified cirrhosis of liver: Secondary | ICD-10-CM | POA: Diagnosis present

## 2017-10-09 DIAGNOSIS — Z79899 Other long term (current) drug therapy: Secondary | ICD-10-CM

## 2017-10-09 DIAGNOSIS — E669 Obesity, unspecified: Secondary | ICD-10-CM | POA: Diagnosis present

## 2017-10-09 DIAGNOSIS — R410 Disorientation, unspecified: Secondary | ICD-10-CM

## 2017-10-09 DIAGNOSIS — E785 Hyperlipidemia, unspecified: Secondary | ICD-10-CM | POA: Diagnosis present

## 2017-10-09 DIAGNOSIS — K31819 Angiodysplasia of stomach and duodenum without bleeding: Secondary | ICD-10-CM | POA: Diagnosis present

## 2017-10-09 DIAGNOSIS — Z6841 Body Mass Index (BMI) 40.0 and over, adult: Secondary | ICD-10-CM

## 2017-10-09 DIAGNOSIS — F329 Major depressive disorder, single episode, unspecified: Secondary | ICD-10-CM | POA: Diagnosis present

## 2017-10-09 DIAGNOSIS — T502X5A Adverse effect of carbonic-anhydrase inhibitors, benzothiadiazides and other diuretics, initial encounter: Secondary | ICD-10-CM | POA: Diagnosis present

## 2017-10-09 DIAGNOSIS — I509 Heart failure, unspecified: Secondary | ICD-10-CM | POA: Diagnosis present

## 2017-10-09 DIAGNOSIS — N183 Chronic kidney disease, stage 3 (moderate): Secondary | ICD-10-CM | POA: Diagnosis present

## 2017-10-09 DIAGNOSIS — E871 Hypo-osmolality and hyponatremia: Secondary | ICD-10-CM | POA: Diagnosis present

## 2017-10-09 DIAGNOSIS — K219 Gastro-esophageal reflux disease without esophagitis: Secondary | ICD-10-CM | POA: Diagnosis present

## 2017-10-09 DIAGNOSIS — K2270 Barrett's esophagus without dysplasia: Secondary | ICD-10-CM | POA: Diagnosis present

## 2017-10-09 DIAGNOSIS — I130 Hypertensive heart and chronic kidney disease with heart failure and stage 1 through stage 4 chronic kidney disease, or unspecified chronic kidney disease: Secondary | ICD-10-CM | POA: Diagnosis present

## 2017-10-09 DIAGNOSIS — E1122 Type 2 diabetes mellitus with diabetic chronic kidney disease: Secondary | ICD-10-CM | POA: Diagnosis present

## 2017-10-09 DIAGNOSIS — N179 Acute kidney failure, unspecified: Secondary | ICD-10-CM | POA: Diagnosis present

## 2017-10-09 DIAGNOSIS — K766 Portal hypertension: Secondary | ICD-10-CM | POA: Diagnosis present

## 2017-10-09 DIAGNOSIS — K7682 Hepatic encephalopathy: Secondary | ICD-10-CM

## 2017-10-09 DIAGNOSIS — K7290 Hepatic failure, unspecified without coma: Secondary | ICD-10-CM | POA: Diagnosis present

## 2017-10-09 DIAGNOSIS — D638 Anemia in other chronic diseases classified elsewhere: Secondary | ICD-10-CM | POA: Diagnosis present

## 2017-10-09 DIAGNOSIS — R739 Hyperglycemia, unspecified: Secondary | ICD-10-CM

## 2017-10-09 LAB — CBC
HEMATOCRIT: 25.3 % — AB (ref 40.0–52.0)
Hemoglobin: 8.7 g/dL — ABNORMAL LOW (ref 13.0–18.0)
MCH: 32 pg (ref 26.0–34.0)
MCHC: 34.6 g/dL (ref 32.0–36.0)
MCV: 92.6 fL (ref 80.0–100.0)
Platelets: 109 10*3/uL — ABNORMAL LOW (ref 150–440)
RBC: 2.73 MIL/uL — AB (ref 4.40–5.90)
RDW: 17.1 % — AB (ref 11.5–14.5)
WBC: 6.4 10*3/uL (ref 3.8–10.6)

## 2017-10-09 LAB — TROPONIN I

## 2017-10-09 LAB — URINALYSIS, COMPLETE (UACMP) WITH MICROSCOPIC
BACTERIA UA: NONE SEEN
Bilirubin Urine: NEGATIVE
Hgb urine dipstick: NEGATIVE
Ketones, ur: NEGATIVE mg/dL
Leukocytes, UA: NEGATIVE
Nitrite: NEGATIVE
PROTEIN: NEGATIVE mg/dL
SQUAMOUS EPITHELIAL / LPF: NONE SEEN
Specific Gravity, Urine: 1.017 (ref 1.005–1.030)
pH: 6 (ref 5.0–8.0)

## 2017-10-09 LAB — BASIC METABOLIC PANEL
Anion gap: 11 (ref 5–15)
BUN: 32 mg/dL — AB (ref 6–20)
CHLORIDE: 87 mmol/L — AB (ref 101–111)
CO2: 31 mmol/L (ref 22–32)
CREATININE: 2.22 mg/dL — AB (ref 0.61–1.24)
Calcium: 9.7 mg/dL (ref 8.9–10.3)
GFR calc Af Amer: 34 mL/min — ABNORMAL LOW (ref >60)
GFR calc non Af Amer: 29 mL/min — ABNORMAL LOW (ref >60)
Glucose, Bld: 543 mg/dL (ref 65–99)
POTASSIUM: 2.8 mmol/L — AB (ref 3.5–5.1)
Sodium: 129 mmol/L — ABNORMAL LOW (ref 135–145)

## 2017-10-09 LAB — GLUCOSE, CAPILLARY
GLUCOSE-CAPILLARY: 442 mg/dL — AB (ref 65–99)
GLUCOSE-CAPILLARY: 393 mg/dL — AB (ref 65–99)
Glucose-Capillary: 529 mg/dL (ref 65–99)
Glucose-Capillary: 416 mg/dL — ABNORMAL HIGH (ref 65–99)
Glucose-Capillary: 355 mg/dL — ABNORMAL HIGH (ref 65–99)

## 2017-10-09 LAB — AMMONIA: AMMONIA: 132 umol/L — AB (ref 9–35)

## 2017-10-09 MED ORDER — POTASSIUM CHLORIDE IN NACL 40-0.9 MEQ/L-% IV SOLN
INTRAVENOUS | Status: AC
  Administered 2017-10-09: 75 mL/h via INTRAVENOUS
  Filled 2017-10-09: qty 1000

## 2017-10-09 MED ORDER — RISAQUAD PO CAPS
2.0000 | ORAL_CAPSULE | Freq: Three times a day (TID) | ORAL | Status: DC
  Administered 2017-10-09 – 2017-10-11 (×5): 2 via ORAL
  Filled 2017-10-09 (×5): qty 2

## 2017-10-09 MED ORDER — LACTULOSE 10 GM/15ML PO SOLN: 30.0000 g | Freq: Once | ORAL | Status: DC

## 2017-10-09 MED ORDER — PANTOPRAZOLE SODIUM 40 MG PO TBEC
40.0000 mg | DELAYED_RELEASE_TABLET | Freq: Two times a day (BID) | ORAL | Status: DC
  Administered 2017-10-09 – 2017-10-11 (×4): 40 mg via ORAL
  Filled 2017-10-09 (×4): qty 1

## 2017-10-09 MED ORDER — FUROSEMIDE 40 MG PO TABS
40.0000 mg | ORAL_TABLET | Freq: Two times a day (BID) | ORAL | Status: DC
  Administered 2017-10-10 – 2017-10-11 (×3): 40 mg via ORAL
  Filled 2017-10-09 (×3): qty 1

## 2017-10-09 MED ORDER — AMMONIUM LACTATE 12 % EX LOTN
TOPICAL_LOTION | Freq: Two times a day (BID) | CUTANEOUS | Status: DC
  Administered 2017-10-10 – 2017-10-11 (×3): via TOPICAL
  Filled 2017-10-09: qty 225

## 2017-10-09 MED ORDER — LACTULOSE 10 GM/15ML PO SOLN: 30.0000 g | Freq: Two times a day (BID) | ORAL | Status: DC

## 2017-10-09 MED ORDER — PNEUMOCOCCAL VAC POLYVALENT 25 MCG/0.5ML IJ INJ: 0.5000 mL | INJECTION | INTRAMUSCULAR | Status: DC

## 2017-10-09 MED ORDER — POTASSIUM CHLORIDE CRYS ER 20 MEQ PO TBCR
20.0000 meq | EXTENDED_RELEASE_TABLET | Freq: Two times a day (BID) | ORAL | Status: DC
  Administered 2017-10-09 – 2017-10-10 (×2): 20 meq via ORAL
  Filled 2017-10-09 (×2): qty 1

## 2017-10-09 MED ORDER — SPIRONOLACTONE 25 MG PO TABS
25.0000 mg | ORAL_TABLET | Freq: Every day | ORAL | Status: DC
  Administered 2017-10-10 – 2017-10-11 (×2): 25 mg via ORAL
  Filled 2017-10-09 (×2): qty 1

## 2017-10-09 MED ORDER — GERHARDT'S BUTT CREAM
1.0000 "application " | TOPICAL_CREAM | Freq: Two times a day (BID) | CUTANEOUS | Status: DC
  Administered 2017-10-09 – 2017-10-11 (×4): 1 via TOPICAL
  Filled 2017-10-09: qty 1

## 2017-10-09 MED ORDER — INSULIN ASPART 100 UNIT/ML ~~LOC~~ SOLN
10.0000 [IU] | Freq: Once | SUBCUTANEOUS | Status: AC
  Administered 2017-10-09: 10 [IU] via INTRAVENOUS
  Filled 2017-10-09: qty 1

## 2017-10-09 MED ORDER — DOCUSATE SODIUM 100 MG PO CAPS: 100.0000 mg | ORAL_CAPSULE | Freq: Two times a day (BID) | ORAL | Status: DC | PRN

## 2017-10-09 MED ORDER — INSULIN ASPART 100 UNIT/ML ~~LOC~~ SOLN
0.0000 [IU] | Freq: Three times a day (TID) | SUBCUTANEOUS | Status: DC
Start: 2017-10-10 — End: 2017-10-11
  Administered 2017-10-10: 5 [IU] via SUBCUTANEOUS
  Administered 2017-10-10 (×2): 9 [IU] via SUBCUTANEOUS
  Administered 2017-10-11 (×2): 3 [IU] via SUBCUTANEOUS
  Filled 2017-10-09 (×5): qty 1

## 2017-10-09 MED ORDER — SODIUM CHLORIDE 0.9 % IV BOLUS (SEPSIS)
1000.0000 mL | Freq: Once | INTRAVENOUS | Status: AC
  Administered 2017-10-09: 1000 mL via INTRAVENOUS

## 2017-10-09 MED ORDER — POTASSIUM CHLORIDE 10 MEQ/100ML IV SOLN
10.0000 meq | Freq: Once | INTRAVENOUS | Status: AC
  Administered 2017-10-09: 10 meq via INTRAVENOUS
  Filled 2017-10-09: qty 100

## 2017-10-09 MED ORDER — INSULIN ASPART PROT & ASPART (70-30 MIX) 100 UNIT/ML ~~LOC~~ SUSP
50.0000 [IU] | Freq: Two times a day (BID) | SUBCUTANEOUS | Status: DC
  Administered 2017-10-10: 50 [IU] via SUBCUTANEOUS
  Filled 2017-10-09: qty 1

## 2017-10-09 MED ORDER — INSULIN ASPART 100 UNIT/ML ~~LOC~~ SOLN
10.0000 [IU] | Freq: Once | SUBCUTANEOUS | Status: AC
  Administered 2017-10-09: 10 [IU] via SUBCUTANEOUS
  Filled 2017-10-09: qty 1

## 2017-10-09 MED ORDER — TRAMADOL HCL 50 MG PO TABS: 50.0000 mg | ORAL_TABLET | Freq: Three times a day (TID) | ORAL | Status: DC | PRN

## 2017-10-09 MED ORDER — LORAZEPAM 0.5 MG PO TABS: 0.5000 mg | ORAL_TABLET | Freq: Three times a day (TID) | ORAL | Status: DC | PRN

## 2017-10-09 MED ORDER — POTASSIUM CHLORIDE 20 MEQ PO PACK
40.0000 meq | PACK | Freq: Once | ORAL | Status: AC
  Administered 2017-10-09: 40 meq via ORAL
  Filled 2017-10-09: qty 2

## 2017-10-09 MED ORDER — ADULT MULTIVITAMIN W/MINERALS CH
ORAL_TABLET | Freq: Every day | ORAL | Status: DC
  Administered 2017-10-10 – 2017-10-11 (×2): 1 via ORAL
  Filled 2017-10-09 (×2): qty 1

## 2017-10-09 NOTE — ED Notes (Addendum)
Pt cont to refuse lactulose; explained importance of taking due to elevated ammonia but pt cont to refuse stating "it just makes such a mess"; report given to [facility] [address], floor nurse and pt transported by EDT, Mayra

## 2017-10-09 NOTE — H&P (Signed)
[place] [place] NAME: Corey West    MR#:  [id number]  DATE OF BIRTH:  1951-03-24  DATE OF ADMISSION:  10/09/2017  PRIMARY CARE PHYSICIAN: Baxter Hire, MD   REQUESTING/REFERRING PHYSICIAN: [email or web address]  CHIEF COMPLAINT:   Chief Complaint  Patient presents with  . Hyperglycemia    HISTORY OF PRESENT ILLNESS: Corey West  is a 67 y.o. male with a known history of Anemia, Gastric AVM and frequent EGD with ablations, Liver cirrhosis, DM, Hyperlipidemia, Htn, CHF- wears compressions on both legs- was planned for EGD this Friday and as a pre-op BMP noted Low K and Low Na, so GI- Dr. Tiffany Kocher suggested to go to ER. Noted to have Hyperglycemia and high ammonia also in ER. Given for admission. Pt is alert and oriented and denies missing his insuline or any meds.  PAST MEDICAL HISTORY:   Past Medical History:  Diagnosis Date  . Anemia   . Anxiety    controlled;   . Arthritis   . AVM (arteriovenous malformation) of stomach, acquired with hemorrhage   . Barrett's esophagus   . Chronic kidney disease    renal infufficiency  . Cirrhosis ([place])   . Depression    controlled;   Marland Kitchen Diabetes mellitus without complication ([place])    not controlled, taking insulin but sugar continues to run high;   . Edema   . Esophageal varices ([place])   . GERD (gastroesophageal reflux disease)   . History of hiatal hernia   . Hyperlipidemia   . Hypertension    controlled well;   . 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 Obesity   . Pancytopenia ([place])   . Polyp, stomach    with chronic blood loss  . Sleep apnea    does not wear a cpap, Medicare would not pay for it   . Venous stasis dermatitis of both lower extremities   . Venous stasis of both lower extremities    with cellulitis    PAST SURGICAL HISTORY:  Past Surgical History:  Procedure Laterality Date  . ESOPHAGOGASTRODUODENOSCOPY N/A 02/09/2015   Procedure: ESOPHAGOGASTRODUODENOSCOPY (EGD);  Surgeon: Manya Silvas, MD;  Location: [facility] ENDOSCOPY;  Service: Endoscopy;  Laterality: N/A;  . ESOPHAGOGASTRODUODENOSCOPY N/A 07/22/2015   Procedure: ESOPHAGOGASTRODUODENOSCOPY (EGD);  Surgeon: Manya Silvas, MD;  Location: [facility] ENDOSCOPY;  Service: Endoscopy;  Laterality: N/A;  . ESOPHAGOGASTRODUODENOSCOPY N/A 06/26/2017   Procedure: ESOPHAGOGASTRODUODENOSCOPY (EGD);  Surgeon: Manya Silvas, MD;  Location: [facility] ENDOSCOPY;  Service: Endoscopy;  Laterality: N/A;  . ESOPHAGOGASTRODUODENOSCOPY N/A 06/27/2017   Procedure: ESOPHAGOGASTRODUODENOSCOPY (EGD);  Surgeon: Manya Silvas, MD;  Location: [facility] ENDOSCOPY;  Service: Endoscopy;  Laterality: N/A;  . ESOPHAGOGASTRODUODENOSCOPY N/A 06/28/2017   Procedure: ESOPHAGOGASTRODUODENOSCOPY (EGD);  Surgeon: Manya Silvas, MD;  Location: [facility] ENDOSCOPY;  Service: Endoscopy;  Laterality: N/A;  . ESOPHAGOGASTRODUODENOSCOPY (EGD) WITH PROPOFOL N/A 07/20/2015   Procedure: ESOPHAGOGASTRODUODENOSCOPY (EGD) WITH PROPOFOL;  Surgeon: Manya Silvas, MD;  Location: [facility] ENDOSCOPY;  Service: Endoscopy;  Laterality: N/A;  . ESOPHAGOGASTRODUODENOSCOPY (EGD) WITH PROPOFOL N/A 09/16/2015   Procedure: ESOPHAGOGASTRODUODENOSCOPY (EGD) WITH PROPOFOL;  Surgeon: Manya Silvas, MD;  Location: [facility] ENDOSCOPY;  Service: Endoscopy;  Laterality: N/A;  . ESOPHAGOGASTRODUODENOSCOPY (EGD) WITH PROPOFOL N/A 03/16/2016   Procedure: ESOPHAGOGASTRODUODENOSCOPY (EGD) WITH PROPOFOL;  Surgeon: Manya Silvas, MD;  Location: [facility] ENDOSCOPY;  Service: Endoscopy;  Laterality: N/A;  . ESOPHAGOGASTRODUODENOSCOPY (EGD) WITH PROPOFOL N/A 09/14/2016   Procedure: ESOPHAGOGASTRODUODENOSCOPY (EGD) WITH PROPOFOL;  Surgeon: Gavin Pound  Vira Agar, MD;  Location: [facility] ENDOSCOPY;  Service: Endoscopy;  Laterality: N/A;  . ESOPHAGOGASTRODUODENOSCOPY (EGD) WITH PROPOFOL N/A 11/05/2016   Procedure: ESOPHAGOGASTRODUODENOSCOPY (EGD) WITH PROPOFOL;  Surgeon: Manya Silvas, MD;  Location: [facility] ENDOSCOPY;  Service:  Endoscopy;  Laterality: N/A;  . ESOPHAGOGASTRODUODENOSCOPY (EGD) WITH PROPOFOL N/A 02/06/2017   Procedure: ESOPHAGOGASTRODUODENOSCOPY (EGD) WITH PROPOFOL;  Surgeon: Manya Silvas, MD;  Location: [facility] ENDOSCOPY;  Service: Endoscopy;  Laterality: N/A;  . ESOPHAGOGASTRODUODENOSCOPY (EGD) WITH PROPOFOL N/A 04/24/2017   Procedure: ESOPHAGOGASTRODUODENOSCOPY (EGD) WITH PROPOFOL;  Surgeon: Manya Silvas, MD;  Location: [facility] ENDOSCOPY;  Service: Endoscopy;  Laterality: N/A;  . ESOPHAGOGASTRODUODENOSCOPY (EGD) WITH PROPOFOL N/A 07/31/2017   Procedure: ESOPHAGOGASTRODUODENOSCOPY (EGD) WITH PROPOFOL;  Surgeon: Manya Silvas, MD;  Location: [facility] ENDOSCOPY;  Service: Endoscopy;  Laterality: N/A;  . TONSILLECTOMY    . TONSILLECTOMY AND ADENOIDECTOMY    . ULNAR NERVE TRANSPOSITION    . UVULOPALATOPHARYNGOPLASTY      SOCIAL HISTORY:  Social History   Tobacco Use  . Smoking status: Current Some Day Smoker    Types: Cigars  . Smokeless tobacco: Never Used  . Tobacco comment: smoke 1 at night occasionally;   Substance Use Topics  . Alcohol use: No    Comment: stopped 12 years ago    FAMILY HISTORY:  Family History  Problem Relation Age of Onset  . Diabetes Other     DRUG ALLERGIES: No Known Allergies  REVIEW OF SYSTEMS:   CONSTITUTIONAL: No fever, fatigue or weakness.  EYES: No blurred or double vision.  EARS, NOSE, AND THROAT: No tinnitus or ear pain.  RESPIRATORY: No cough, shortness of breath, wheezing or hemoptysis.  CARDIOVASCULAR: No chest pain, orthopnea, edema.  GASTROINTESTINAL: No nausea, vomiting, diarrhea or abdominal pain.  GENITOURINARY: No dysuria, hematuria.  ENDOCRINE: No polyuria, nocturia,  HEMATOLOGY: No anemia, easy bruising or bleeding SKIN: No rash or lesion. MUSCULOSKELETAL: No joint pain or arthritis.   NEUROLOGIC: No tingling, numbness, weakness.  PSYCHIATRY: No anxiety or depression.   MEDICATIONS AT HOME:  Prior to Admission medications    Medication Sig Start Date End Date Taking? Authorizing Provider  acidophilus (RISAQUAD) CAPS capsule Take 2 capsules by mouth 3 (three) times daily. 09/05/17  Yes Gouru, Aruna, MD  ammonium lactate (AMLACTIN) 12 % cream Apply topically 2 (two) times daily.   Yes [provider]  FERROUS SULFATE ER PO Take 325 mg by mouth 2 (two) times daily.    Yes [provider]  furosemide (LASIX) 40 MG tablet Take 40 mg by mouth 2 (two) times daily.   Yes [provider]  Hydrocortisone (GERHARDT'S BUTT CREAM) CREA Apply 1 application topically 2 (two) times daily. 09/05/17  Yes Gouru, Illene Silver, MD  insulin aspart protamine- aspart (NOVOLOG MIX 70/30) (70-30) 100 UNIT/ML injection Inject 0.5 mLs (50 Units total) into the skin 2 (two) times daily with a meal. Pt uses 84 units with breakfast and 62 units with supper. Patient taking differently: Inject 65-84 Units into the skin 2 (two) times daily with a meal. 84-AM 65-PM 02/22/17  Yes Fritzi Mandes, MD  lactulose (CHRONULAC) 10 GM/15ML solution Take 30 g by mouth at bedtime.    Yes [provider]  LORazepam (ATIVAN) 0.5 MG tablet Take 0.5 mg by mouth 3 (three) times daily as needed for anxiety.    Yes [provider]  metolazone (ZAROXOLYN) 2.5 MG tablet Take 2.5 mg by mouth daily.   Yes [provider]  Multiple Vitamin (MULTIVITAMIN) tablet Take 1  tablet by mouth daily.   Yes [provider]  omeprazole (PRILOSEC) 40 MG capsule Take 40 mg by mouth 2 (two) times daily.   Yes [provider]  potassium chloride SA (K-DUR,KLOR-CON) 20 MEQ tablet Take 1 tablet by mouth daily. 08/19/17 11/17/17 Yes [provider]  spironolactone (ALDACTONE) 25 MG tablet Take 25 mg by mouth daily.   Yes [provider]  traMADol (ULTRAM) 50 MG tablet Take 50 mg by mouth every 8 (eight) hours as needed.    Yes [provider]      PHYSICAL EXAMINATION:   VITAL SIGNS: Blood pressure (!)  124/56, pulse 78, temperature 98.2 F (36.8 C), temperature source Oral, resp. rate 15, height 5\' 9"  (1.753 m), weight (!) 140.6 kg (310 lb), SpO2 100 %.  GENERAL:  67 y.o.-year-old obese patient lying in the bed with no acute distress.  EYES: Pupils equal, round, reactive to light and accommodation. No scleral icterus. Extraocular muscles intact.  HEENT: Head atraumatic, normocephalic. Oropharynx and nasopharynx clear.  NECK:  Supple, no jugular venous distention. No thyroid enlargement, no tenderness.  LUNGS: Normal breath sounds bilaterally, no wheezing, rales,rhonchi or crepitation. No use of accessory muscles of respiration.  CARDIOVASCULAR: S1, S2 normal. No murmurs, rubs, or gallops.  ABDOMEN: Soft, nontender, nondistended. Bowel sounds present. No organomegaly or mass.  EXTREMITIES: b/l pedal edema, no cyanosis, or clubbing. B/l legs compression stocking present. NEUROLOGIC: Cranial nerves II through XII are intact. Muscle strength 5/5 in all extremities. Sensation intact. Gait not checked.  PSYCHIATRIC: The patient is alert and oriented x 3.  SKIN: No obvious rash, lesion, or ulcer.   LABORATORY PANEL:   CBC Recent Labs  Lab 10/09/17 1437  WBC 6.4  HGB 8.7*  HCT 25.3*  PLT 109*  MCV 92.6  MCH 32.0  MCHC 34.6  RDW 17.1*   ------------------------------------------------------------------------------------------------------------------  Chemistries  Recent Labs  Lab 10/09/17 1437  NA 129*  K 2.8*  CL 87*  CO2 31  GLUCOSE 543*  BUN 32*  CREATININE 2.22*  CALCIUM 9.7   ------------------------------------------------------------------------------------------------------------------ estimated creatinine clearance is 46.3 mL/min (A) (by C-G formula based on SCr of 2.22 mg/dL (H)). ------------------------------------------------------------------------------------------------------------------ No results for input(s): TSH, T4TOTAL, T3FREE, THYROIDAB in the last 72  hours.  Invalid input(s): FREET3   Coagulation profile No results for input(s): INR, PROTIME in the last 168 hours. ------------------------------------------------------------------------------------------------------------------- No results for input(s): DDIMER in the last 72 hours. -------------------------------------------------------------------------------------------------------------------  Cardiac Enzymes Recent Labs  Lab 10/09/17 1437  TROPONINI <0.03   ------------------------------------------------------------------------------------------------------------------ Invalid input(s): POCBNP  ---------------------------------------------------------------------------------------------------------------  Urinalysis    Component Value Date/Time   COLORURINE YELLOW (A) 10/09/2017 1521   APPEARANCEUR CLEAR (A) 10/09/2017 1521   APPEARANCEUR Clear 09/10/2014 0456   LABSPEC 1.017 10/09/2017 1521   LABSPEC 1.016 09/10/2014 0456   PHURINE 6.0 10/09/2017 1521   GLUCOSEU >=500 (A) 10/09/2017 1521   GLUCOSEU Negative 09/10/2014 0456   HGBUR NEGATIVE 10/09/2017 1521   BILIRUBINUR NEGATIVE 10/09/2017 1521   BILIRUBINUR Negative 09/10/2014 0456   KETONESUR NEGATIVE 10/09/2017 1521   PROTEINUR NEGATIVE 10/09/2017 1521   NITRITE NEGATIVE 10/09/2017 1521   LEUKOCYTESUR NEGATIVE 10/09/2017 1521   LEUKOCYTESUR Negative 09/10/2014 0456     RADIOLOGY: Dg Chest 2 View  Result Date: 10/09/2017 CLINICAL DATA:  Hyperglycemia. EXAM: CHEST  2 VIEW COMPARISON:  06/12/2017 FINDINGS: The lungs are clear without focal pneumonia, edema, pneumothorax or pleural effusion. There is pulmonary vascular congestion without overt pulmonary edema. The cardiopericardial silhouette is within normal limits  for size. The visualized bony structures of the thorax are intact. IMPRESSION: No active cardiopulmonary disease. Electronically Signed   By: Misty Stanley M.D.   On: 10/09/2017 16:36     EKG: Orders placed or performed during the hospital encounter of 10/09/17  . ED EKG  . ED EKG    IMPRESSION AND PLAN:  * Hypokalemia   Replace IV and oral, check Mg  * hyponatremia   IV NS   Hold metolazone.  * Hyperglycemia   Resume his Novolog 70/30 and give Iss.   No DKA.  * CHF   Severe chronic edema   Cont lasix oral.  * High ammonia   Fully alert and oriented   Cont lactulose.  * CKD stage 3   Monitor, stable.  * Anemia   Recurrent GI bleed and EGD due to AVM    No active bleed, follows with GI clinic.     No need for inpatient management.  * Active smoking   Counseled to quit for 4 min, offered nicotine patch.  All the records are reviewed and case discussed with ED provider. Management plans discussed with the patient, family and they are in agreement.  CODE STATUS: Full. Code Status History    Date Active Date Inactive Code Status Order ID Comments User Context   09/03/2017 21:24 09/05/2017 15:52 Full Code [id number]  Nicholes Mango, MD Inpatient   06/26/2017 11:19 06/28/2017 17:48 Full Code [id number]  Henreitta Leber, MD Inpatient   02/21/2017 20:07 02/22/2017 18:03 Full Code [id number]  [name], MD Inpatient   07/20/2015 12:45 07/22/2015 22:17 Full Code [id number]  Nicholes Mango, MD Inpatient   02/08/2015 23:31 02/11/2015 19:43 Full Code [id number]  Hower, Aaron Mose, MD ED       TOTAL TIME TAKING CARE OF THIS PATIENT: 45 minutes.    [name] M.D on 10/09/2017   Between 7am to 6pm - Pager - [phone]  After 6pm go to [email or web address] - password [email or web address] [facility] [facility]  Office  [phone]  CC: Primary care physician; Baxter Hire, MD   Note: This dictation was prepared with Dragon dictation along with smaller phrase technology. Any transcriptional errors that result from this process are unintentional.

## 2017-10-09 NOTE — ED Provider Notes (Addendum)
[facility] Emergency Department Provider Note  Time seen: 3:44 PM  I have reviewed the triage vital signs and the nursing notes.   HISTORY  Chief Complaint Hyperglycemia    HPI Corey KOEPPEN is a 67 y.o. male with a past medical history of anemia, anxiety, cirrhosis on lactulose, diabetes on insulin, gastric reflux, hypertension, hyperlipidemia, presents to the emergency department with complaints of abnormal lab work and generalized fatigue/weakness.  According to the patient for the past several days he has been feeling extremely weak and fatigued, went to his doctor had blood work performed which showed a elevated blood glucose and a low potassium and he was referred to the emergency department for further evaluation.  Patient states he has had a progressive generalized weakness, states he has been on increased Lasix over the past several weeks for peripheral edema.  Patient also taking his lactulose as prescribed by his GI doctor.  Patient was scheduled for an EGD this past Friday for polyp removal but this has been rescheduled due to his abnormal lab work.  Patient does state some shortness of breath, denies any current chest pain.  Patient states lower extremity edema which is chronic and improved from several weeks ago.   Past Medical History:  Diagnosis Date  . Anemia   . Anxiety    controlled;   . Arthritis   . AVM (arteriovenous malformation) of stomach, acquired with hemorrhage   . Barrett's esophagus   . Chronic kidney disease    renal infufficiency  . Cirrhosis ([place])   . Depression    controlled;   Marland Kitchen Diabetes mellitus without complication ([place])    not controlled, taking insulin but sugar continues to run high;   . Edema   . Esophageal varices ([place])   . GERD (gastroesophageal reflux disease)   . History of hiatal hernia   . Hyperlipidemia   . Hypertension    controlled well;   . 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 Obesity   . Pancytopenia ([place])    . Polyp, stomach    with chronic blood loss  . Sleep apnea    does not wear a cpap, Medicare would not pay for it   . Venous stasis dermatitis of both lower extremities   . Venous stasis of both lower extremities    with cellulitis    Patient Active Problem List   Diagnosis Date Noted  . Symptomatic anemia 06/26/2017  . Sepsis ([place]) 06/12/2017  . Cellulitis in diabetic foot ([place]) 02/21/2017  . Uncontrolled diabetes mellitus ([place]) 02/21/2017  . Cellulitis 02/21/2017  . GI bleed 07/20/2015  . Hematemesis 02/08/2015  . Cirrhosis ([place]) 02/08/2015  . Esophageal varices ([place]) 02/08/2015  . Type 2 diabetes mellitus ([place]) 02/08/2015  . Gastroesophageal reflux disease 02/08/2015  . Hyperlipidemia 02/08/2015  . Anemia 12/04/2013  . Thrombocytopenia ([place]) 12/04/2013  . Splenomegaly 11/04/2013    Past Surgical History:  Procedure Laterality Date  . ESOPHAGOGASTRODUODENOSCOPY N/A 02/09/2015   Procedure: ESOPHAGOGASTRODUODENOSCOPY (EGD);  Surgeon: Manya Silvas, MD;  Location: [facility] ENDOSCOPY;  Service: Endoscopy;  Laterality: N/A;  . ESOPHAGOGASTRODUODENOSCOPY N/A 07/22/2015   Procedure: ESOPHAGOGASTRODUODENOSCOPY (EGD);  Surgeon: Manya Silvas, MD;  Location[facility] ENDOSCOPY;  Service: Endoscopy;  Laterality: N/A;  . ESOPHAGOGASTRODUODENOSCOPY N/A 06/26/2017   Procedure: ESOPHAGOGASTRODUODENOSCOPY (EGD);  Surgeon: Manya Silvas, MD;  Location: [facility] ENDOSCOPY;  Service: Endoscopy;  Laterality: N/A;  . ESOPHAGOGASTRODUODENOSCOPY N/A 06/27/2017   Procedure: ESOPHAGOGASTRODUODENOSCOPY (EGD);  Surgeon: Manya Silvas, MD;  Location:  [facility] ENDOSCOPY;  Service: Endoscopy;  Laterality: N/A;  . ESOPHAGOGASTRODUODENOSCOPY N/A 06/28/2017   Procedure: ESOPHAGOGASTRODUODENOSCOPY (EGD);  Surgeon: Manya Silvas, MD;  Location: [facility] ENDOSCOPY;  Service: Endoscopy;  Laterality: N/A;  . ESOPHAGOGASTRODUODENOSCOPY (EGD) WITH PROPOFOL N/A 07/20/2015   Procedure: ESOPHAGOGASTRODUODENOSCOPY (EGD)  WITH PROPOFOL;  Surgeon: Manya Silvas, MD;  Location: [facility] ENDOSCOPY;  Service: Endoscopy;  Laterality: N/A;  . ESOPHAGOGASTRODUODENOSCOPY (EGD) WITH PROPOFOL N/A 09/16/2015   Procedure: ESOPHAGOGASTRODUODENOSCOPY (EGD) WITH PROPOFOL;  Surgeon: Manya Silvas, MD;  Location: [facility] ENDOSCOPY;  Service: Endoscopy;  Laterality: N/A;  . ESOPHAGOGASTRODUODENOSCOPY (EGD) WITH PROPOFOL N/A 03/16/2016   Procedure: ESOPHAGOGASTRODUODENOSCOPY (EGD) WITH PROPOFOL;  Surgeon: Manya Silvas, MD;  Location: [facility] ENDOSCOPY;  Service: Endoscopy;  Laterality: N/A;  . ESOPHAGOGASTRODUODENOSCOPY (EGD) WITH PROPOFOL N/A 09/14/2016   Procedure: ESOPHAGOGASTRODUODENOSCOPY (EGD) WITH PROPOFOL;  Surgeon: Manya Silvas, MD;  Location: [facility] ENDOSCOPY;  Service: Endoscopy;  Laterality: N/A;  . ESOPHAGOGASTRODUODENOSCOPY (EGD) WITH PROPOFOL N/A 11/05/2016   Procedure: ESOPHAGOGASTRODUODENOSCOPY (EGD) WITH PROPOFOL;  Surgeon: Manya Silvas, MD;  Location: [facility] ENDOSCOPY;  Service: Endoscopy;  Laterality: N/A;  . ESOPHAGOGASTRODUODENOSCOPY (EGD) WITH PROPOFOL N/A 02/06/2017   Procedure: ESOPHAGOGASTRODUODENOSCOPY (EGD) WITH PROPOFOL;  Surgeon: Manya Silvas, MD;  Location: [facility] ENDOSCOPY;  Service: Endoscopy;  Laterality: N/A;  . ESOPHAGOGASTRODUODENOSCOPY (EGD) WITH PROPOFOL N/A 04/24/2017   Procedure: ESOPHAGOGASTRODUODENOSCOPY (EGD) WITH PROPOFOL;  Surgeon: Manya Silvas, MD;  Location: [facility] ENDOSCOPY;  Service: Endoscopy;  Laterality: N/A;  . ESOPHAGOGASTRODUODENOSCOPY (EGD) WITH PROPOFOL N/A 07/31/2017   Procedure: ESOPHAGOGASTRODUODENOSCOPY (EGD) WITH PROPOFOL;  Surgeon: Manya Silvas, MD;  Location: [facility] ENDOSCOPY;  Service: Endoscopy;  Laterality: N/A;  . TONSILLECTOMY    . TONSILLECTOMY AND ADENOIDECTOMY    . ULNAR NERVE TRANSPOSITION    . UVULOPALATOPHARYNGOPLASTY      Prior to Admission medications   Medication Sig Start Date End Date Taking? Authorizing Provider  acidophilus (RISAQUAD) CAPS  capsule Take 2 capsules by mouth 3 (three) times daily. 09/05/17   Gouru, Illene Silver, MD  ammonium lactate (AMLACTIN) 12 % cream Apply topically 2 (two) times daily.    [provider]  FERROUS SULFATE ER PO Take 325 mg by mouth 2 (two) times daily.     [provider]  furosemide (LASIX) 40 MG tablet Take 40 mg by mouth 2 (two) times daily.    [provider]  Hydrocortisone (GERHARDT'S BUTT CREAM) CREA Apply 1 application topically 2 (two) times daily. 09/05/17   Gouru, Illene Silver, MD  insulin aspart protamine- aspart (NOVOLOG MIX 70/30) (70-30) 100 UNIT/ML injection Inject 0.5 mLs (50 Units total) into the skin 2 (two) times daily with a meal. Pt uses 84 units with breakfast and 62 units with supper. Patient taking differently: Inject 65-84 Units into the skin 2 (two) times daily with a meal. 84-AM 65-PM 02/22/17   Fritzi Mandes, MD  lactulose (CHRONULAC) 10 GM/15ML solution Take 30 g by mouth at bedtime.     [provider]  LORazepam (ATIVAN) 0.5 MG tablet Take 0.5 mg by mouth 3 (three) times daily as needed for anxiety.     [provider]  metolazone (ZAROXOLYN) 2.5 MG tablet Take 2.5 mg by mouth daily.    [provider]  Multiple Vitamin (MULTIVITAMIN) tablet Take 1 tablet by mouth daily.    [provider]  nadolol (CORGARD) 40 MG tablet Take 40 mg by mouth daily.    [provider]  omeprazole (PRILOSEC) 40 MG capsule Take 40 mg by mouth  2 (two) times daily.    [provider]  potassium chloride SA (K-DUR,KLOR-CON) 20 MEQ tablet Take 1 tablet by mouth daily. 08/19/17 11/17/17  [provider]  rifaximin (XIFAXAN) 550 MG TABS tablet Take 550 mg by mouth 2 (two) times daily.    [provider]  spironolactone (ALDACTONE) 25 MG tablet Take 25 mg by mouth daily.    [provider]  sucralfate (CARAFATE) 1 g tablet Take 1 g by mouth 4 (four) times daily -  with meals and at bedtime.    [provider]  traMADol (ULTRAM) 50 MG tablet Take 50 mg by mouth every 6 (six) hours as needed.    [provider]    No Known Allergies  Family History  Problem Relation Age of Onset  . Diabetes Other     Social History Social History   Tobacco Use  . Smoking status: Current Some Day Smoker    Types: Cigars  . Smokeless tobacco: Never Used  . Tobacco comment: smoke 1 at night occasionally;   Substance Use Topics  . Alcohol use: No    Comment: stopped 12 years ago  . Drug use: No    Review of Systems Constitutional: Negative for fever. Eyes: Negative for visual complaints ENT: Negative for congestion Cardiovascular: Negative for chest pain.  Respiratory: Positive for shortness of breath. Gastrointestinal: Negative for abdominal pain.  Positive for intermittent nausea.  Negative for vomiting.  Positive for loose stool but the patient is on lactulose. Genitourinary: Negative for dysuria.  Positive for urinary frequency, patient is on Lasix. Musculoskeletal: Lower extremity edema although improved from several weeks ago Skin: Negative for rash. Neurological: Negative for headache complaints. All other ROS negative  ____________________________________________   PHYSICAL EXAM:  VITAL SIGNS: ED Triage Vitals  Enc Vitals Group     BP 10/09/17 1437 (!) 134/44     Pulse Rate 10/09/17 1437 84     Resp 10/09/17 1437 20     Temp 10/09/17 1437 98.2 F (36.8 C)     Temp Source 10/09/17 1437 Oral     SpO2 10/09/17 1437 100 %     Weight 10/09/17 1437 (!) 310 lb (140.6 kg)     Height 10/09/17 1437 5\' 9"  (1.753 m)     Head Circumference --      Peak Flow --      Pain Score 10/09/17 1440 0     Pain Loc --      Pain Edu? --      Excl. in [place]? --     Constitutional: Alert and oriented. Well appearing and in no distress. Eyes: Normal exam ENT   Head: Normocephalic and atraumatic.   Mouth/Throat: Mucous membranes are moist. Cardiovascular: Normal rate,  regular rhythm. No murmur Respiratory: Normal respiratory effort without tachypnea nor retractions. Breath sounds are clear  Gastrointestinal: Soft and nontender. No distention.   Musculoskeletal: Nontender with normal range of motion in all extremities.  Positive for lower extremity edema, currently in lower extremity wraps. Neurologic:  Normal speech and language. No gross focal neurologic deficits Skin:  Skin is warm, dry and intact.  Psychiatric: Mood and affect are normal.  ____________________________________________   Medical screening examination/treatment/procedure(s) were performed by non-physician practitioner and as supervising physician I was immediately available for consultation/collaboration.     RADIOLOGY  Chest x-ray negative  EKG reviewed and interpreted by myself showsIrregular rhythm around 80 bpm.  No obvious P waves.  Slight QTC prolongation, nonspecific ST  changes. ____________________________________________   INITIAL IMPRESSION / ASSESSMENT AND PLAN / ED COURSE  Pertinent labs & imaging results that were available during my care of the patient were reviewed by me and considered in my medical decision making (see chart for details).  Patient presents to the emergency department for an elevated blood sugar, generalized weakness and shortness of breath.  Patient sent for abnormal lab work.  Differential would include hyperglycemia, HHS, DKA, states some confusion at times, would consider hepatic encephalopathy, infectious etiology.  Given shortness of breath we will obtain a chest x-ray.  We will check lab work and urinalysis. Patient's lab work has resulted showing an elevated blood glucose, potassium of 2.8.  We will treat with insulin, IV fluids.  Currently sodium of 129 with pseudohyponatremia.  Creatinine 2.2 but largely at baseline.  Urinalysis within normal limits.  CBC is pending although per record review the patient has significant anemia at  baseline.  I reviewed the patient's records including recent lab work performed in the clinic today.  Patient is hyperglycemic, admits that he has not been checking his blood sugar at home.  Suspect is generalized weakness/fatigue is likely due to dehydration and possible over diuresis.  We will IV hydrate while awaiting further results.   Patient's labs have resulted with a significant ammonia 132, last check 1 month ago was 22.  This likely explains the patient's confusion which she has been describing intermittently over the past 1 week.  We will restart the patient on lactulose, admit to the hospital for further glucose control and hepatic encephalopathy.  Patient's hemoglobin is low but largely at baseline for the patient.  Potassium is currently being repleted.  ____________________________________________   FINAL CLINICAL IMPRESSION(S) / ED DIAGNOSES  hyperglycemia weakness    Harvest Dark, MD 10/09/17 2841    Harvest Dark, MD 10/09/17 [phone]

## 2017-10-09 NOTE — ED Notes (Signed)
Pt uprite on stretcher in exam room with no distress noted; pt denies any c/o at present; pt voices good understanding of plan of care

## 2017-10-09 NOTE — ED Notes (Signed)
Pt c/o irritation with the potassium infusion, site flushed with NS 78ml and infusion slowed to 222ml/hr

## 2017-10-09 NOTE — ED Triage Notes (Signed)
Pt to ed with c/o hyperglycemia.  Pt also reports potassium is low.

## 2017-10-09 NOTE — ED Notes (Signed)
Pt assisted to ambulate to bathroom using cane for assistance.

## 2017-10-09 NOTE — ED Notes (Addendum)
Dr Anselm Jungling, hospitalist, in to speak with pt; notified of FSBS results; no orders given at this time

## 2017-10-10 LAB — BASIC METABOLIC PANEL
Anion gap: 9 (ref 5–15)
BUN: 30 mg/dL — ABNORMAL HIGH (ref 6–20)
CHLORIDE: 96 mmol/L — AB (ref 101–111)
CO2: 29 mmol/L (ref 22–32)
CREATININE: 1.95 mg/dL — AB (ref 0.61–1.24)
Calcium: 8.9 mg/dL (ref 8.9–10.3)
GFR, EST AFRICAN AMERICAN: 40 mL/min — AB (ref >60)
GFR, EST NON AFRICAN AMERICAN: 34 mL/min — AB (ref >60)
Glucose, Bld: 315 mg/dL — ABNORMAL HIGH (ref 65–99)
POTASSIUM: 2.9 mmol/L — AB (ref 3.5–5.1)
SODIUM: 134 mmol/L — AB (ref 135–145)

## 2017-10-10 LAB — CBC
HCT: 22.7 % — ABNORMAL LOW (ref 40.0–52.0)
Hemoglobin: 7.9 g/dL — ABNORMAL LOW (ref 13.0–18.0)
MCH: 31.9 pg (ref 26.0–34.0)
MCHC: 34.8 g/dL (ref 32.0–36.0)
MCV: 91.7 fL (ref 80.0–100.0)
Platelets: 102 10*3/uL — ABNORMAL LOW (ref 150–440)
RBC: 2.48 MIL/uL — AB (ref 4.40–5.90)
RDW: 16.6 % — ABNORMAL HIGH (ref 11.5–14.5)
WBC: 5.4 10*3/uL (ref 3.8–10.6)

## 2017-10-10 LAB — GLUCOSE, CAPILLARY
GLUCOSE-CAPILLARY: 374 mg/dL — AB (ref 65–99)
Glucose-Capillary: 296 mg/dL — ABNORMAL HIGH (ref 65–99)
Glucose-Capillary: 375 mg/dL — ABNORMAL HIGH (ref 65–99)
Glucose-Capillary: 313 mg/dL — ABNORMAL HIGH (ref 65–99)

## 2017-10-10 LAB — POTASSIUM: POTASSIUM: 3.3 mmol/L — AB (ref 3.5–5.1)

## 2017-10-10 LAB — AMMONIA: AMMONIA: 102 umol/L — AB (ref 9–35)

## 2017-10-10 LAB — MAGNESIUM: Magnesium: 1.9 mg/dL (ref 1.7–2.4)

## 2017-10-10 MED ORDER — INSULIN DETEMIR 100 UNIT/ML ~~LOC~~ SOLN
25.0000 [IU] | Freq: Two times a day (BID) | SUBCUTANEOUS | Status: DC
  Administered 2017-10-10 – 2017-10-11 (×3): 25 [IU] via SUBCUTANEOUS
  Filled 2017-10-10 (×4): qty 0.25

## 2017-10-10 MED ORDER — INSULIN ASPART 100 UNIT/ML ~~LOC~~ SOLN: 0.0000 [IU] | Freq: Three times a day (TID) | SUBCUTANEOUS | Status: DC

## 2017-10-10 MED ORDER — POTASSIUM CHLORIDE CRYS ER 20 MEQ PO TBCR
20.0000 meq | EXTENDED_RELEASE_TABLET | Freq: Once | ORAL | Status: AC
  Administered 2017-10-10: 20 meq via ORAL
  Filled 2017-10-10: qty 1

## 2017-10-10 MED ORDER — INSULIN ASPART 100 UNIT/ML ~~LOC~~ SOLN
0.0000 [IU] | Freq: Every day | SUBCUTANEOUS | Status: DC
  Administered 2017-10-10: 4 [IU] via SUBCUTANEOUS
  Filled 2017-10-10: qty 1

## 2017-10-10 MED ORDER — MAGNESIUM SULFATE 2 GM/50ML IV SOLN
2.0000 g | Freq: Once | INTRAVENOUS | Status: AC
  Administered 2017-10-10: 2 g via INTRAVENOUS
  Filled 2017-10-10: qty 50

## 2017-10-10 MED ORDER — TRAMADOL HCL 50 MG PO TABS: 50.0000 mg | ORAL_TABLET | Freq: Three times a day (TID) | ORAL | Status: DC | PRN

## 2017-10-10 MED ORDER — INSULIN ASPART 100 UNIT/ML ~~LOC~~ SOLN
7.0000 [IU] | Freq: Three times a day (TID) | SUBCUTANEOUS | Status: DC
  Administered 2017-10-10 – 2017-10-11 (×2): 7 [IU] via SUBCUTANEOUS
  Filled 2017-10-10 (×2): qty 1

## 2017-10-10 MED ORDER — LACTULOSE 10 GM/15ML PO SOLN
10.0000 g | Freq: Every day | ORAL | Status: DC
  Filled 2017-10-10 (×2): qty 30

## 2017-10-10 MED ORDER — POTASSIUM CHLORIDE CRYS ER 10 MEQ PO TBCR
30.0000 meq | EXTENDED_RELEASE_TABLET | Freq: Two times a day (BID) | ORAL | Status: DC
  Administered 2017-10-10 – 2017-10-11 (×2): 30 meq via ORAL
  Filled 2017-10-10 (×2): qty 1

## 2017-10-10 MED ORDER — POTASSIUM CHLORIDE 10 MEQ/100ML IV SOLN
10.0000 meq | INTRAVENOUS | Status: AC
  Administered 2017-10-10 (×2): 10 meq via INTRAVENOUS
  Filled 2017-10-10 (×2): qty 100

## 2017-10-10 NOTE — Progress Notes (Addendum)
Inpatient Diabetes Program Recommendations  AACE/ADA: New Consensus Statement on Inpatient Glycemic Control (2015)  Target Ranges:  Prepandial:   less than 140 mg/dL      Peak postprandial:   less than 180 mg/dL (1-2 hours)      Critically ill patients:  140 - 180 mg/dL   Lab Results  Component Value Date   GLUCAP 296 (H) 10/10/2017   HGBA1C 7.0 (H) 09/04/2017    Review of Glycemic Control  Results for Corey West, Corey West (MRN [id number]) as of 10/10/2017 10:01  Ref. Range 10/09/2017 17:32 10/09/2017 20:17 10/09/2017 21:11 10/09/2017 21:55 10/10/2017 07:48  Glucose-Capillary Latest Ref Range: 65 - 99 mg/dL 442 (H) 416 (H) 393 (H) 355 (H) 296 (H)    Diabetes history: Type 2 Outpatient Diabetes medications: Novolog mix 70/30 84 units qam, Novolog 70/30 65units qsupper  Current orders for Inpatient glycemic control: Novolog 70/30 50 units bid, Novolog 0-9 units tid  Inpatient Diabetes Program Recommendations:  Please d/c Novolog 70/30 while patient is in the hospital- consider Levemir 25units Bid, Novolog 7 units tid - start tonight at supper.   Continue Novolog 0-9 units tid, consider adding Novolog 0-5 units qhs.  Spoke to patient at th bedside- he confirms he is taking these doses at home.   Gentry Fitz, RN, BA, MHA, CDE Diabetes Coordinator Inpatient Diabetes Program  [phone] (Team Pager) [phone] ([place]) 10/10/2017 10:29 AM

## 2017-10-10 NOTE — Progress Notes (Addendum)
[place] [facility] [place] NAME: Corey West    MR#:  [id number]  DATE OF BIRTH:  December 07, 1950  SUBJECTIVE: Admitted yesterday due to electrolyte imbalance.  Patient denies any complaints, slept well.  Potassium continues to be low at 2.9.  Sent by Dr. Vira Agar.  frequent bowel movements and wants to decrease lactulose dose.  CHIEF COMPLAINT:   Chief Complaint  Patient presents with  . Hyperglycemia    REVIEW OF SYSTEMS:   ROS CONSTITUTIONAL: No fever, fatigue or weakness.  EYES: No blurred or double vision.  EARS, NOSE, AND THROAT: No tinnitus or ear pain.  RESPIRATORY: No cough, shortness of breath, wheezing or hemoptysis.  CARDIOVASCULAR: No chest pain, orthopnea, edema.  GASTROINTESTINAL: No nausea, vomiting, diarrhea or abdominal pain.  GENITOURINARY: No dysuria, hematuria.  ENDOCRINE: No polyuria, nocturia,  HEMATOLOGY: No anemia, easy bruising or bleeding SKIN: No rash or lesion. MUSCULOSKELETAL: No joint pain or arthritis.   NEUROLOGIC: No tingling, numbness, weakness.  PSYCHIATRY: No anxiety or depression.   DRUG ALLERGIES:  No Known Allergies  VITALS:  Blood pressure (!) 107/48, pulse 80, temperature 98.2 F (36.8 C), temperature source Oral, resp. rate 20, height 5\' 9"  (1.753 m), weight (!) 140.6 kg (310 lb), SpO2 99 %.  PHYSICAL EXAMINATION:  GENERAL:  67 y.o.-year-old patient lying in the bed with no acute distress.  EYES: Pupils equal, round, reactive to lightn. No scleral icterus. Extraocular muscles intact.  HEENT: Head atraumatic, normocephalic. Oropharynx and nasopharynx clear.  NECK:  Supple, no jugular venous distention. No thyroid enlargement, no tenderness.  LUNGS: Normal breath sounds bilaterally, no wheezing, rales,rhonchi or crepitation. No use of accessory muscles of respiration.  CARDIOVASCULAR: S1, S2 normal. No murmurs, rubs, or gallops.  ABDOMEN: Soft, nontender, nondistended. Bowel sounds present. No  organomegaly or mass.  EXTREMITIES: No pedal edema, cyanosis, or clubbing.  NEUROLOGIC: Cranial nerves II through XII are intact. Muscle strength 5/5 in all extremities. Sensation intact. Gait not checked.  PSYCHIATRIC: The patient is alert and oriented x 3.  SKIN: No obvious rash, lesion, or ulcer.    LABORATORY PANEL:   CBC Recent Labs  Lab 10/10/17 0452  WBC 5.4  HGB 7.9*  HCT 22.7*  PLT 102*   ------------------------------------------------------------------------------------------------------------------  Chemistries  Recent Labs  Lab 10/10/17 0452  NA 134*  K 2.9*  CL 96*  CO2 29  GLUCOSE 315*  BUN 30*  CREATININE 1.95*  CALCIUM 8.9  MG 1.9   ------------------------------------------------------------------------------------------------------------------  Cardiac Enzymes Recent Labs  Lab 10/09/17 1437  TROPONINI <0.03   ------------------------------------------------------------------------------------------------------------------  RADIOLOGY:  Dg Chest 2 View  Result Date: 10/09/2017 CLINICAL DATA:  Hyperglycemia. EXAM: CHEST  2 VIEW COMPARISON:  06/12/2017 FINDINGS: The lungs are clear without focal pneumonia, edema, pneumothorax or pleural effusion. There is pulmonary vascular congestion without overt pulmonary edema. The cardiopericardial silhouette is within normal limits for size. The visualized bony structures of the thorax are intact. IMPRESSION: No active cardiopulmonary disease. Electronically Signed   By: Misty Stanley M.D.   On: 10/09/2017 16:36    EKG:   Orders placed or performed during the hospital encounter of 10/09/17  . ED EKG  . ED EKG    ASSESSMENT AND PLAN:   #1. severe hypokalemia due to diuretics: Continue to replace potassium, Potassium still at 2.9.  Pharmacy consulted for management of electrolytes.  Magnesium 1.9.  Replace potassium, magnesium today.  Hold metoprolol and at this time.    #3 hyponatremia: Improved  with  fluids.  #4. history of AV malformations, patient gets EGD by Dr. 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  5.  Acute on chronic anemia, hemoglobin stable at 7.9.  Small drop from 8.7-7.9 likely due to dilutional.  6.  History of liver cirrhosis, patient is on Lasix, Aldactone, lactulose.  Slightly elevated ammonia without mental status change.  Decrease  dose of lactulose as patient is alert and having frequent bowel movements  7.:  Acute on chronic renal failure, baseline creatinine 1.9, creatinine improved from 2.2-1.95.  Diabetes mellitus type 2:  Hyperglycemia  yesterday, sugar was 543 and then decreased to 296 today.  Continue NovoLog Mix 70 /3050 units twice daily, sliding scale insulin with coverage,  All the records are reviewed and case discussed with Care Management/Social Workerr. Management plans discussed with the patient, family and they are in agreement.  CODE STATUS: full TOTAL TIME TAKING CARE OF THIS PATIENT: 40minutes.   POSSIBLE D/C IN 1-2 DAYS, DEPENDING ON CLINICAL CONDITION.   Epifanio Lesches M.D on 10/10/2017 at 9:03 AM  Between 7am to 6pm - Pager - [phone]  After 6pm go to [email or web address] - password [email or web address]  Office  [phone]  CC: Primary care physician; Baxter Hire, MD   Note: This dictation was prepared with Dragon dictation along with smaller phrase technology. Any transcriptional errors that result from this process are unintentional.

## 2017-10-10 NOTE — Progress Notes (Addendum)
Pharmacy Electrolyte Monitoring Consult:  Pharmacy consulted to assist in monitoring and replacing electrolytes in this 67 y.o. male admitted on 10/09/2017 with Hyperglycemia and hypokalemia  Labs:  Sodium (mmol/L)  Date Value  10/10/2017 134 (L)  09/14/2014 138   Potassium (mmol/L)  Date Value  10/10/2017 2.9 (L)  09/14/2014 3.4 (L)   Magnesium (mg/dL)  Date Value  10/10/2017 1.9  05/11/2014 2.1   Phosphorus (mg/dL)  Date Value  02/10/2015 3.1   Calcium (mg/dL)  Date Value  10/10/2017 8.9   Calcium, Total (mg/dL)  Date Value  09/14/2014 8.8   Albumin (g/dL)  Date Value  09/04/2017 2.4 (L)  09/11/2014 2.4 (L)    Plan: K 2.9, Mag 1.9 Patient currently on Lasix 40 mg po BID and spironolactone 25 mg daily and has KCL PO 20 meq BID ordered.  Scr 1.95, Crcl 53  Will add KCL IV 10 meq x 2 (20 meq) and KCL PO 20 meq x 1 (in addition to current PO order for 40 meq). MD would like to replace Mag also- will order Magnesium 2 gram IV x 1.  Will recheck K+ at 1800.     Fread Kottke A 10/10/2017 9:14 AM

## 2017-10-10 NOTE — Plan of Care (Signed)
Pt ambulating to and from bathroom.

## 2017-10-10 NOTE — Progress Notes (Signed)
Pharmacy Electrolyte Monitoring Consult:  Pharmacy consulted to assist in monitoring and replacing electrolytes in this 67 y.o. male admitted on 10/09/2017 with Hyperglycemia and hypokalemia  Labs:  Sodium (mmol/L)  Date Value  10/10/2017 134 (L)  09/14/2014 138   Potassium (mmol/L)  Date Value  10/10/2017 3.3 (L)  09/14/2014 3.4 (L)   Magnesium (mg/dL)  Date Value  10/10/2017 1.9  05/11/2014 2.1   Phosphorus (mg/dL)  Date Value  02/10/2015 3.1   Calcium (mg/dL)  Date Value  10/10/2017 8.9   Calcium, Total (mg/dL)  Date Value  09/14/2014 8.8   Albumin (g/dL)  Date Value  09/04/2017 2.4 (L)  09/11/2014 2.4 (L)    Plan: K 3.3, Mag 1.9 Patient currently on Lasix 40 mg po BID and spironolactone 25 mg daily and has KCL PO 20 meq BID ordered.  Scr 1.95, Crcl 53  Pt has received 60 MEQ of KCL today. K improved from 2.9 to 3.3. Pt on lasix and spironolactone. I will increase daily supplementation to 30 MEQ daily. Dose due tonight at 2200. Will recheck in AM.   Ramond Dial, Pharm.D, BCPS Clinical Pharmacist  10/10/2017 7:05 PM

## 2017-10-10 NOTE — Consult Note (Signed)
GI Inpatient Consult Note  Reason for Consult:Follow up slow GI bleed in stomach due to recurrent AVM and gastric polyps which tend to bleed.  Attending Requesting Consult:Patient well known to me and requested EGD while in hospital.  History of Present Illness: Corey West is a 67 y.o. male  Long hx of recurrent UGI slow bleed with anemia requiring recurrent transfusions and repeat EGD with cauterization.  He had a glucose over 500 and I recommended he be hospitalized due to this.  His sugar has come down to mid 300 level today.  He has non alcoholic cirrhosis and diabetes and signif obesity.  Past Medical History:  Past Medical History:  Diagnosis Date  . Anemia   . Anxiety    controlled;   . Arthritis   . AVM (arteriovenous malformation) of stomach, acquired with hemorrhage   . Barrett's esophagus   . Chronic kidney disease    renal infufficiency  . Cirrhosis ([place])   . Depression    controlled;   Marland Kitchen Diabetes mellitus without complication ([place])    not controlled, taking insulin but sugar continues to run high;   . Edema   . Esophageal varices ([place])   . GERD (gastroesophageal reflux disease)   . History of hiatal hernia   . Hyperlipidemia   . Hypertension    controlled well;   . 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 Obesity   . Pancytopenia ([place])   . Polyp, stomach    with chronic blood loss  . Sleep apnea    does not wear a cpap, Medicare would not pay for it   . Venous stasis dermatitis of both lower extremities   . Venous stasis of both lower extremities    with cellulitis    Problem List: Patient Active Problem List   Diagnosis Date Noted  . Hypokalemia 10/09/2017  . Hyperglycemia 10/09/2017  . Symptomatic anemia 06/26/2017  . Sepsis ([place]) 06/12/2017  . Cellulitis in diabetic foot ([place]) 02/21/2017  . Uncontrolled diabetes mellitus ([place]) 02/21/2017  . Cellulitis 02/21/2017  . GI bleed 07/20/2015  . Hematemesis 02/08/2015  . Cirrhosis ([place]) 02/08/2015  . Esophageal  varices ([place]) 02/08/2015  . Type 2 diabetes mellitus ([place]) 02/08/2015  . Gastroesophageal reflux disease 02/08/2015  . Hyperlipidemia 02/08/2015  . Anemia 12/04/2013  . Thrombocytopenia ([place]) 12/04/2013  . Splenomegaly 11/04/2013    Past Surgical History: Past Surgical History:  Procedure Laterality Date  . ESOPHAGOGASTRODUODENOSCOPY N/A 02/09/2015   Procedure: ESOPHAGOGASTRODUODENOSCOPY (EGD);  Surgeon: Manya Silvas, MD;  Location: [facility] ENDOSCOPY;  Service: Endoscopy;  Laterality: N/A;  . ESOPHAGOGASTRODUODENOSCOPY N/A 07/22/2015   Procedure: ESOPHAGOGASTRODUODENOSCOPY (EGD);  Surgeon: Manya Silvas, MD;  Location: [facility] ENDOSCOPY;  Service: Endoscopy;  Laterality: N/A;  . ESOPHAGOGASTRODUODENOSCOPY N/A 06/26/2017   Procedure: ESOPHAGOGASTRODUODENOSCOPY (EGD);  Surgeon: Manya Silvas, MD;  Location: [facility] ENDOSCOPY;  Service: Endoscopy;  Laterality: N/A;  . ESOPHAGOGASTRODUODENOSCOPY N/A 06/27/2017   Procedure: ESOPHAGOGASTRODUODENOSCOPY (EGD);  Surgeon: Manya Silvas, MD;  Location: [facility] ENDOSCOPY;  Service: Endoscopy;  Laterality: N/A;  . ESOPHAGOGASTRODUODENOSCOPY N/A 06/28/2017   Procedure: ESOPHAGOGASTRODUODENOSCOPY (EGD);  Surgeon: Manya Silvas, MD;  Location: [facility] ENDOSCOPY;  Service: Endoscopy;  Laterality: N/A;  . ESOPHAGOGASTRODUODENOSCOPY (EGD) WITH PROPOFOL N/A 07/20/2015   Procedure: ESOPHAGOGASTRODUODENOSCOPY (EGD) WITH PROPOFOL;  Surgeon: Manya Silvas, MD;  Location: [facility] ENDOSCOPY;  Service: Endoscopy;  Laterality: N/A;  . ESOPHAGOGASTRODUODENOSCOPY (EGD) WITH PROPOFOL N/A 09/16/2015   Procedure: ESOPHAGOGASTRODUODENOSCOPY (EGD) WITH PROPOFOL;  Surgeon: Manya Silvas, MD;  Location:  [facility] ENDOSCOPY;  Service: Endoscopy;  Laterality: N/A;  . ESOPHAGOGASTRODUODENOSCOPY (EGD) WITH PROPOFOL N/A 03/16/2016   Procedure: ESOPHAGOGASTRODUODENOSCOPY (EGD) WITH PROPOFOL;  Surgeon: Manya Silvas, MD;  Location: [facility] ENDOSCOPY;  Service: Endoscopy;  Laterality:  N/A;  . ESOPHAGOGASTRODUODENOSCOPY (EGD) WITH PROPOFOL N/A 09/14/2016   Procedure: ESOPHAGOGASTRODUODENOSCOPY (EGD) WITH PROPOFOL;  Surgeon: Manya Silvas, MD;  Location: [facility] ENDOSCOPY;  Service: Endoscopy;  Laterality: N/A;  . ESOPHAGOGASTRODUODENOSCOPY (EGD) WITH PROPOFOL N/A 11/05/2016   Procedure: ESOPHAGOGASTRODUODENOSCOPY (EGD) WITH PROPOFOL;  Surgeon: Manya Silvas, MD;  Location: [facility] ENDOSCOPY;  Service: Endoscopy;  Laterality: N/A;  . ESOPHAGOGASTRODUODENOSCOPY (EGD) WITH PROPOFOL N/A 02/06/2017   Procedure: ESOPHAGOGASTRODUODENOSCOPY (EGD) WITH PROPOFOL;  Surgeon: Manya Silvas, MD;  Location: [facility] ENDOSCOPY;  Service: Endoscopy;  Laterality: N/A;  . ESOPHAGOGASTRODUODENOSCOPY (EGD) WITH PROPOFOL N/A 04/24/2017   Procedure: ESOPHAGOGASTRODUODENOSCOPY (EGD) WITH PROPOFOL;  Surgeon: Manya Silvas, MD;  Location: [facility] ENDOSCOPY;  Service: Endoscopy;  Laterality: N/A;  . ESOPHAGOGASTRODUODENOSCOPY (EGD) WITH PROPOFOL N/A 07/31/2017   Procedure: ESOPHAGOGASTRODUODENOSCOPY (EGD) WITH PROPOFOL;  Surgeon: Manya Silvas, MD;  Location: [facility] ENDOSCOPY;  Service: Endoscopy;  Laterality: N/A;  . TONSILLECTOMY    . TONSILLECTOMY AND ADENOIDECTOMY    . ULNAR NERVE TRANSPOSITION    . UVULOPALATOPHARYNGOPLASTY      Allergies: No Known Allergies  Home Medications: Medications Prior to Admission  Medication Sig Dispense Refill Last Dose  . acidophilus (RISAQUAD) CAPS capsule Take 2 capsules by mouth 3 (three) times daily. 90 capsule 0 N/A at N/A  . ammonium lactate (AMLACTIN) 12 % cream Apply topically 2 (two) times daily.   UTD at UTD  . FERROUS SULFATE ER PO Take 325 mg by mouth 2 (two) times daily.    N/A at N/A  . furosemide (LASIX) 40 MG tablet Take 40 mg by mouth 2 (two) times daily.   N/A at N/A  . Hydrocortisone (GERHARDT'S BUTT CREAM) CREA Apply 1 application topically 2 (two) times daily. 1 each 1 UTD at UTD  . insulin aspart protamine- aspart (NOVOLOG MIX 70/30) (70-30)  100 UNIT/ML injection Inject 0.5 mLs (50 Units total) into the skin 2 (two) times daily with a meal. Pt uses 84 units with breakfast and 62 units with supper. (Patient taking differently: Inject 65-84 Units into the skin 2 (two) times daily with a meal. 84-AM 65-PM) 10 mL 11 N/A at N/A  . lactulose (CHRONULAC) 10 GM/15ML solution Take 30 g by mouth at bedtime.    N/A at N/A  . LORazepam (ATIVAN) 0.5 MG tablet Take 0.5 mg by mouth 3 (three) times daily as needed for anxiety.    PRN at PRN  . metolazone (ZAROXOLYN) 2.5 MG tablet Take 2.5 mg by mouth daily.   N/A at N/A  . Multiple Vitamin (MULTIVITAMIN) tablet Take 1 tablet by mouth daily.   N/A at N/A  . omeprazole (PRILOSEC) 40 MG capsule Take 40 mg by mouth 2 (two) times daily.   N/A at N/A  . potassium chloride SA (K-DUR,KLOR-CON) 20 MEQ tablet Take 1 tablet by mouth daily.   N/A at N/A  . spironolactone (ALDACTONE) 25 MG tablet Take 25 mg by mouth daily.   N/A at N/A  . traMADol (ULTRAM) 50 MG tablet Take 50 mg by mouth every 8 (eight) hours as needed.    N/A at N/A   Home medication reconciliation was completed with the patient.   Scheduled Inpatient Medications:   . acidophilus  2 capsule Oral TID  . ammonium lactate  Topical BID  . furosemide  40 mg Oral BID  . Gerhardt's butt cream  1 application Topical BID  . insulin aspart  0-5 Units Subcutaneous QHS  . insulin aspart  0-9 Units Subcutaneous TID WC  . insulin aspart  7 Units Subcutaneous TID WC  . insulin detemir  25 Units Subcutaneous BID  . lactulose  10 g Oral Daily  . multivitamin with minerals   Oral Daily  . pantoprazole  40 mg Oral BID  . pneumococcal 23 valent vaccine  0.5 mL Intramuscular Tomorrow-1000  . potassium chloride  20 mEq Oral BID  . spironolactone  25 mg Oral Daily    Continuous Inpatient Infusions:    PRN Inpatient Medications:  LORazepam, traMADol  Family History: family history includes Diabetes in his other.  The patient's family history is  negative for inflammatory bowel disorders, GI malignancy, or solid organ transplantation.  Social History:   reports that he has been smoking cigars.  he has never used smokeless tobacco. He reports that he does not drink alcohol or use drugs. The patient denies ETOH, tobacco, or drug use.   Review of Systems: Constitutional: Weight is stable.  Eyes: No changes in vision. ENT: No oral lesions, sore throat.  GI: see HPI.  Heme/Lymph: No easy bruising.  CV: No chest pain.  GU: No hematuria.  Integumentary: No rashes.  Neuro: No headaches.  Psych: No depression/anxiety.  Endocrine: No heat/cold intolerance.  Allergic/Immunologic: No urticaria.  Resp: No cough, SOB.  Musculoskeletal: No joint swelling.    Physical Examination: BP (!) 145/62 (BP Location: Left Arm)   Pulse 77   Temp 97.8 F (36.6 C) (Oral)   Resp 14   Ht 5\' 9"  (1.753 m)   Wt (!) 140.6 kg (310 lb)   SpO2 100%   BMI 45.78 kg/m  Gen: NAD, alert and oriented x 4 HEENT: PEERLA, EOMI, Neck: supple, no JVD or thyromegaly Chest: CTA bilaterally, no wheezes, crackles, or other adventitious sounds CV: RRR, no m/g/c/r Abd: soft, NT, ND, +BS in all four quadrants; no HSM, guarding, ridigity, or rebound tenderness. Pt with very large abdomen and has been obese for a long time unable to palpate liver or spleen. Ext: severe skin changes and edema  Skin: no rash or lesions noted Lymph: no LAD  Data: Lab Results  Component Value Date   WBC 5.4 10/10/2017   HGB 7.9 (L) 10/10/2017   HCT 22.7 (L) 10/10/2017   MCV 91.7 10/10/2017   PLT 102 (L) 10/10/2017   Recent Labs  Lab 10/09/17 1437 10/10/17 0452  HGB 8.7* 7.9*   Lab Results  Component Value Date   NA 134 (L) 10/10/2017   K 2.9 (L) 10/10/2017   CL 96 (L) 10/10/2017   CO2 29 10/10/2017   BUN 30 (H) 10/10/2017   CREATININE 1.95 (H) 10/10/2017   Lab Results  Component Value Date   ALT 19 09/04/2017   AST 26 09/04/2017   ALKPHOS 56 09/04/2017   BILITOT  0.6 09/04/2017   No results for input(s): APTT, INR, PTT in the last 168 hours. Assessment/Plan: Mr. Minella is a 67 y.o. male with diabetes and had to be admitted due to glucose over 500.  He has non alcoholic cirrhosis and recurrent gastric bleeding requiring cauterizations numerous times to keep him from bleeding.    Recommendations:Recommend to do EGD tomorrow  And likely cauterizations of gastric AVMs and possible polypectomy.  Thank you for the consult. Please call with questions or  concerns.  [name], MD

## 2017-10-11 ENCOUNTER — Encounter: Payer: Self-pay | Admitting: Certified Registered Nurse Anesthetist

## 2017-10-11 ENCOUNTER — Ambulatory Visit
Admission: RE | Admit: 2017-10-11 | Payer: Medicare Other | Source: Ambulatory Visit | Admitting: Unknown Physician Specialty

## 2017-10-11 ENCOUNTER — Encounter
Admission: EM | Disposition: A | Discharge status: Home / Self Care | Payer: Self-pay | Source: Home / Self Care | Attending: Internal Medicine

## 2017-10-11 ENCOUNTER — Inpatient Hospital Stay: Payer: Medicare Other | Admitting: Certified Registered Nurse Anesthetist

## 2017-10-11 ENCOUNTER — Encounter: Admission: RE | Payer: Self-pay | Source: Ambulatory Visit

## 2017-10-11 HISTORY — DX: Hyperlipidemia, unspecified: E78.5

## 2017-10-11 HISTORY — DX: Venous insufficiency (chronic) (peripheral): I87.2

## 2017-10-11 HISTORY — DX: Edema, unspecified: R60.9

## 2017-10-11 HISTORY — PX: ESOPHAGOGASTRODUODENOSCOPY: SHX5428

## 2017-10-11 HISTORY — DX: Other pancytopenia: D61.818

## 2017-10-11 HISTORY — DX: Type 2 diabetes mellitus with diabetic nephropathy: E11.21

## 2017-10-11 HISTORY — DX: Obesity, unspecified: E66.9

## 2017-10-11 LAB — BASIC METABOLIC PANEL
ANION GAP: 9 (ref 5–15)
BUN: 31 mg/dL — ABNORMAL HIGH (ref 6–20)
CHLORIDE: 100 mmol/L — AB (ref 101–111)
CO2: 29 mmol/L (ref 22–32)
Calcium: 9.3 mg/dL (ref 8.9–10.3)
Creatinine, Ser: 1.98 mg/dL — ABNORMAL HIGH (ref 0.61–1.24)
GFR calc non Af Amer: 34 mL/min — ABNORMAL LOW (ref >60)
GFR, EST AFRICAN AMERICAN: 39 mL/min — AB (ref >60)
Glucose, Bld: 215 mg/dL — ABNORMAL HIGH (ref 65–99)
POTASSIUM: 3.1 mmol/L — AB (ref 3.5–5.1)
Sodium: 138 mmol/L (ref 135–145)

## 2017-10-11 LAB — MAGNESIUM: Magnesium: 2.1 mg/dL (ref 1.7–2.4)

## 2017-10-11 LAB — GLUCOSE, CAPILLARY
GLUCOSE-CAPILLARY: 221 mg/dL — AB (ref 65–99)
Glucose-Capillary: 236 mg/dL — ABNORMAL HIGH (ref 65–99)

## 2017-10-11 SURGERY — EGD (ESOPHAGOGASTRODUODENOSCOPY)
Anesthesia: General | Laterality: Bilateral

## 2017-10-11 SURGERY — ESOPHAGOGASTRODUODENOSCOPY (EGD) WITH PROPOFOL
Anesthesia: General

## 2017-10-11 MED ORDER — POTASSIUM CHLORIDE CRYS ER 20 MEQ PO TBCR
40.0000 meq | EXTENDED_RELEASE_TABLET | Freq: Once | ORAL | Status: DC
  Filled 2017-10-11: qty 2

## 2017-10-11 MED ORDER — PROPOFOL 500 MG/50ML IV EMUL
INTRAVENOUS | Status: AC
  Filled 2017-10-11: qty 50

## 2017-10-11 MED ORDER — PROPOFOL 10 MG/ML IV BOLUS
INTRAVENOUS | Status: DC | PRN
  Administered 2017-10-11: 260 mg via INTRAVENOUS

## 2017-10-11 MED ORDER — SODIUM CHLORIDE 0.9 % IV SOLN
INTRAVENOUS | Status: DC
  Administered 2017-10-11: 1000 mL via INTRAVENOUS

## 2017-10-11 MED ORDER — POTASSIUM CHLORIDE CRYS ER 20 MEQ PO TBCR: 20.0000 meq | EXTENDED_RELEASE_TABLET | Freq: Two times a day (BID) | ORAL | 2 refills | Status: DC

## 2017-10-11 MED ORDER — LIDOCAINE HCL (CARDIAC) 20 MG/ML IV SOLN
INTRAVENOUS | Status: DC | PRN
  Administered 2017-10-11: 50 mg via INTRAVENOUS

## 2017-10-11 MED ORDER — LIDOCAINE HCL (PF) 2 % IJ SOLN
INTRAMUSCULAR | Status: AC
  Filled 2017-10-11: qty 10

## 2017-10-11 NOTE — Care Management Important Message (Signed)
Important Message  Patient Details  Name: ALFREDDIE CONSALVO MRN: [id number] Date of Birth: [birth year]   Medicare Important Message Given:  N/A - LOS <3 / Initial given by admissions    Beverly Sessions, RN 10/11/2017, 1:52 PM

## 2017-10-11 NOTE — Op Note (Signed)
[facility] Gastroenterology Patient Name: Corey West Procedure Date: 10/11/2017 8:59 AM MRN: [id number] Account #: [id number] Date of Birth: Jun 23, 1951 Admit Type: Inpatient Age: 67 Room: [facility] ENDO ROOM 3 Gender: Male Note Status: Finalized Procedure:            Upper GI endoscopy Indications:          Arteriovenous malformation in the stomach, Suspected                        upper gastrointestinal bleeding in patient with chronic                        blood loss Providers:            Manya Silvas, MD Referring MD:         Baxter Hire, MD (Referring MD) Medicines:            Propofol per Anesthesia Complications:        No immediate complications. Procedure:            Pre-Anesthesia Assessment:                       - After reviewing the risks and benefits, the patient                        was deemed in satisfactory condition to undergo the                        procedure.                       After obtaining informed consent, the endoscope was                        passed under direct vision. Throughout the procedure,                        the patient's blood pressure, pulse, and oxygen                        saturations were monitored continuously. The Endoscope                        was introduced through the mouth, and advanced to the                        second part of duodenum. The upper GI endoscopy was                        accomplished without difficulty. The patient tolerated                        the procedure well. Findings:      There were esophageal mucosal changes consistent with short-segment       Barrett's esophagus present in the lower third of the esophagus. The       maximum longitudinal extent of these mucosal changes was 3 cm in length.      Mild gastric antral vascular ectasia without bleeding was present in the       gastric body and in the gastric antrum.  Mild portal hypertensive gastropathy was found in the  gastric body and       in the gastric antrum.      Diffuse mildly erythematous mucosa without active bleeding and with no       stigmata of bleeding was found in the duodenal bulb.      Stomach looked better than it has in a long time. Impression:           - Esophageal mucosal changes consistent with                        short-segment Barrett's esophagus.                       - Gastric antral vascular ectasia without bleeding.                       - Portal hypertensive gastropathy.                       - Erythematous duodenopathy.                       - No specimens collected. Recommendation:       - The findings and recommendations were discussed with                        the patient's family. Manya Silvas, MD 10/11/2017 9:14:58 AM This report has been signed electronically. Number of Addenda: 0 Note Initiated On: 10/11/2017 8:59 AM      [facility]

## 2017-10-11 NOTE — Progress Notes (Signed)
Inpatient Diabetes Program Recommendations  AACE/ADA: New Consensus Statement on Inpatient Glycemic Control (2015)  Target Ranges:  Prepandial:   less than 140 mg/dL      Peak postprandial:   less than 180 mg/dL (1-2 hours)      Critically ill patients:  140 - 180 mg/dL   Lab Results  Component Value Date   GLUCAP 221 (H) 10/11/2017   HGBA1C 7.0 (H) 09/04/2017    Review of Glycemic Control   Results for Corey West, Corey West (MRN [id number]) as of 10/11/2017 11:40  Ref. Range 10/10/2017 07:48 10/10/2017 11:51 10/10/2017 16:47 10/10/2017 21:20 10/11/2017 07:48  Glucose-Capillary Latest Ref Range: 65 - 99 mg/dL 296 (H) 374 (H) 375 (H) 313 (H) 221 (H)   Diabetes history: Type 2 Outpatient Diabetes medications: Novolog mix 70/30 84 units qam, Novolog 70/30 65units qsupper  Current orders for Inpatient glycemic control: Novolog 0-9 units tid, Novolog 0-5 unit qhs, Levemir 25 units bid, Novolog 7 units tid  Inpatient Diabetes Program Recommendations:  Fasting blood sugar 221mg /dl- please increase Levemir to 30 units bid.     Gentry Fitz, RN, BA, MHA, CDE Diabetes Coordinator Inpatient Diabetes Program  [phone] (Team Pager) [phone] ([place]) 11/07/2017 3:00 PM

## 2017-10-11 NOTE — Anesthesia Post-op Follow-up Note (Signed)
Anesthesia QCDR form completed.        

## 2017-10-11 NOTE — Progress Notes (Signed)
Pt discharged per MD order. IV removed. Discharge instructions reviewed with pt. Pt understands to pick up prescription from pharmacy. All questions answered to pt satisfaction. Pt taken to car in wheelchair by staff.

## 2017-10-11 NOTE — Anesthesia Postprocedure Evaluation (Signed)
Anesthesia Post Note  Patient: Corey West  Procedure(s) Performed: ESOPHAGOGASTRODUODENOSCOPY (EGD) (Bilateral )  Patient location during evaluation: Endoscopy Anesthesia Type: General Level of consciousness: awake and alert Pain management: pain level controlled Vital Signs Assessment: post-procedure vital signs reviewed and stable Respiratory status: spontaneous breathing, nonlabored ventilation, respiratory function stable and patient connected to nasal cannula oxygen Cardiovascular status: blood pressure returned to baseline and stable Postop Assessment: no apparent nausea or vomiting Anesthetic complications: no     Last Vitals:  Vitals:   10/11/17 0956 10/11/17 1006  BP: (!) 133/58 (!) 142/62  Pulse: 70 69  Resp: 17 14  Temp:    SpO2: 100% 100%    Last Pain:  Vitals:   10/11/17 0916  TempSrc: Tympanic  PainSc:                  Elexus Barman S

## 2017-10-11 NOTE — Consult Note (Signed)
Patient EGD looked better than previous ones.  No active bleeding, no large clots. No polyps that need to be removed at this time.  Barretts looks the same.  He can go home when diabetes under some control and follow up in 2-3 weeks in our office for a cbc.

## 2017-10-11 NOTE — Anesthesia Preprocedure Evaluation (Deleted)
Anesthesia Evaluation  Patient identified by MRN, date of birth, ID band Patient awake    Reviewed: Allergy & Precautions, NPO status , Patient's Chart, lab work & pertinent test results, reviewed documented beta blocker date and time   Airway Mallampati: III  TM Distance: >3 FB     Dental  (+) Chipped   Pulmonary sleep apnea , Current Smoker,           Cardiovascular hypertension, Pt. on medications      Neuro/Psych PSYCHIATRIC DISORDERS Anxiety Depression    GI/Hepatic hiatal hernia, GERD  ,  Endo/Other  diabetes, Type 2  Renal/GU Renal disease     Musculoskeletal  (+) Arthritis ,   Abdominal   Peds  Hematology  (+) anemia ,   Anesthesia Other Findings   Reproductive/Obstetrics                             Anesthesia Physical Anesthesia Plan  ASA: III  Anesthesia Plan: General   Post-op Pain Management:    Induction: Intravenous  PONV Risk Score and Plan:   Airway Management Planned:   Additional Equipment:   Intra-op Plan:   Post-operative Plan:   Informed Consent: I have reviewed the patients History and Physical, chart, labs and discussed the procedure including the risks, benefits and alternatives for the proposed anesthesia with the patient or authorized representative who has indicated his/her understanding and acceptance.     Plan Discussed with: CRNA  Anesthesia Plan Comments:         Anesthesia Quick Evaluation

## 2017-10-11 NOTE — Anesthesia Preprocedure Evaluation (Signed)
Anesthesia Evaluation  Patient identified by MRN, date of birth, ID band Patient awake    Reviewed: Allergy & Precautions, NPO status , Patient's Chart, lab work & pertinent test results  History of Anesthesia Complications Negative for: history of anesthetic complications  Airway Mallampati: III       Dental  (+) Teeth Intact, Chipped, Dental Advidsory Given   Pulmonary neg shortness of breath, sleep apnea , COPD, neg recent URI, Current Smoker,     + decreased breath sounds      Cardiovascular Exercise Tolerance: Poor hypertension, Pt. on medications (-) angina+ Peripheral Vascular Disease  (-) CAD, (-) Past MI, (-) Cardiac Stents and (-) CABG (-) dysrhythmias (-) Valvular Problems/Murmurs Rhythm:Irregular     Neuro/Psych PSYCHIATRIC DISORDERS Anxiety Depression    GI/Hepatic hiatal hernia, GERD  Medicated,(+) Cirrhosis   Esophageal Varices    ,   Endo/Other  diabetes, Type 1, Insulin DependentMorbid obesity  Renal/GU Renal disease     Musculoskeletal   Abdominal (+) + obese,   Peds negative pediatric ROS (+)  Hematology  (+) anemia ,   Anesthesia Other Findings Past Medical History: No date: Anemia No date: Anxiety     Comment:  controlled;  No date: Arthritis No date: AVM (arteriovenous malformation) of stomach, acquired with  hemorrhage No date: Barrett's esophagus No date: Chronic kidney disease     Comment:  renal infufficiency No date: Cirrhosis ([place]) No date: Depression     Comment:  controlled;  No date: Diabetes mellitus without complication ([facility])     Comment:  not controlled, taking insulin but sugar continues to               run high;  No date: Edema No date: Esophageal varices (HCC) No date: GERD (gastroesophageal reflux disease) No date: History of hiatal hernia No date: Hyperlipidemia No date: Hypertension     Comment:  controlled well;  No date: Nephropathy, 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[place]) No  date: Obesity No date: Pancytopenia ([place]) No date: Polyp, stomach     Comment:  with chronic blood loss No date: Sleep apnea     Comment:  does not wear a cpap, Medicare would not pay for it  No date: Venous stasis dermatitis of both lower extremities No date: Venous stasis of both lower extremities     Comment:  with cellulitis   Reproductive/Obstetrics negative OB ROS                             Anesthesia Physical  Anesthesia Plan  ASA: III  Anesthesia Plan: General   Post-op Pain Management:    Induction: Intravenous  PONV Risk Score and Plan: 1 and Propofol infusion  Airway Management Planned: Natural Airway and Nasal Cannula  Additional Equipment:   Intra-op Plan:   Post-operative Plan:   Informed Consent: I have reviewed the patients History and Physical, chart, labs and discussed the procedure including the risks, benefits and alternatives for the proposed anesthesia with the patient or authorized representative who has indicated his/her understanding and acceptance.     Plan Discussed with: CRNA  Anesthesia Plan Comments:         Anesthesia Quick Evaluation

## 2017-10-11 NOTE — Progress Notes (Signed)
[facility] NAME: Corey West    MR#:  [id number]  DATE OF BIRTH:  [birth year] EGD , E EGD ager to go home.  Patient EGD is normal without evidence of AV malformations Chief Complaint  Patient presents with  . Hyperglycemia    REVIEW OF SYSTEMS:   ROS CONSTITUTIONAL: No fever, fatigue or weakness.  EYES: No blurred or double vision.  EARS, NOSE, AND THROAT: No tinnitus or ear pain.  RESPIRATORY: No cough, shortness of breath, wheezing or hemoptysis.  CARDIOVASCULAR: No chest pain, orthopnea, edema.  GASTROINTESTINAL: No nausea, vomiting, diarrhea or abdominal pain.  GENITOURINARY: No dysuria, hematuria.  ENDOCRINE: No polyuria, nocturia,  HEMATOLOGY: No anemia, easy bruising or bleeding SKIN: No rash or lesion. MUSCULOSKELETAL: No joint pain or arthritis.   NEUROLOGIC: No tingling, numbness, weakness.  PSYCHIATRY: No anxiety or depression.   DRUG ALLERGIES:  No Known Allergies  VITALS:  Blood pressure 133/62, pulse 70, temperature 97.7 F (36.5 C), temperature source Oral, resp. rate 16, height 5\' 9"  (1.753 m), weight (!) 140.6 kg (310 lb), SpO2 99 %.  PHYSICAL EXAMINATION:  GENERAL:  66 y.o.-year-old patient lying in the bed with no acute distress.  EYES: Pupils equal, round, reactive to lightn. No scleral icterus. Extraocular muscles intact.  HEENT: Head atraumatic, normocephalic. Oropharynx and nasopharynx clear.  NECK:  Supple, no jugular venous distention. No thyroid enlargement, no tenderness.  LUNGS: Normal breath sounds bilaterally, no wheezing, rales,rhonchi or crepitation. No use of accessory muscles of respiration.  CARDIOVASCULAR: S1, S2 normal. No murmurs, rubs, or gallops.  ABDOMEN: Soft, nontender, nondistended. Bowel sounds present. No organomegaly or mass.  EXTREMITIES: No pedal edema, cyanosis, or clubbing.  NEUROLOGIC: Cranial nerves II through XII are intact. Muscle strength 5/5 in all  extremities. Sensation intact. Gait not checked.  PSYCHIATRIC: The patient is alert and oriented x 3.  SKIN: No obvious rash, lesion, or ulcer.    LABORATORY PANEL:   CBC Recent Labs  Lab 10/10/17 0452  WBC 5.4  HGB 7.9*  HCT 22.7*  PLT 102*   ------------------------------------------------------------------------------------------------------------------  Chemistries  Recent Labs  Lab 10/11/17 0428  NA 138  K 3.1*  CL 100*  CO2 29  GLUCOSE 215*  BUN 31*  CREATININE 1.98*  CALCIUM 9.3  MG 2.1   ------------------------------------------------------------------------------------------------------------------  Cardiac Enzymes Recent Labs  Lab 10/09/17 1437  TROPONINI <0.03   ------------------------------------------------------------------------------------------------------------------  RADIOLOGY:  Dg Chest 2 View  Result Date: 10/09/2017 CLINICAL DATA:  Hyperglycemia. EXAM: CHEST  2 VIEW COMPARISON:  06/12/2017 FINDINGS: The lungs are clear without focal pneumonia, edema, pneumothorax or pleural effusion. There is pulmonary vascular congestion without overt pulmonary edema. The cardiopericardial silhouette is within normal limits for size. The visualized bony structures of the thorax are intact. IMPRESSION: No active cardiopulmonary disease. Electronically Signed   By: Misty Stanley M.D.   On: 10/09/2017 16:36    EKG:   Orders placed or performed during the hospital encounter of 10/09/17  . ED EKG  . ED EKG    ASSESSMENT AND PLAN:   #1. severe hypokalemia due to diuretics: Improved, patient can continue potassium supplements at discharge.  Can take KCl 20 p.o. twice daily for 3 days followed by 20 MB q. p.o. daily.  Instructed the patient.  #3 hyponatremia: Improved with fluids.  #4. history of AV malformations, status post EGD, no of polyps, AV malformations are identified.  Patient is eager to go home.  5.  Acute on chronic anemia, hemoglobin stable  at 7.9.  Small drop from 8.7-7.9 likely due to dilutional.  6.  History of liver cirrhosis, continue Aldactone, Lasix, Zaroxolyn.  7.:  Acute on chronic renal failure, baseline creatinine 1.9, creatinine improved from 2.2-1.95.  Diabetes mellitus type 2: Hyperglycemia is corrected, by diabetic nurse, commended to restart 70/30 at discharge, patient takes 84 units in the morning, 65 units with supper.  Advised the patient to continue that.       all the records are reviewed and case discussed with Care Management/Social Workerr. Management plans discussed with the patient, family and they are in agreement.  CODE STATUS: full TOTAL TIME TAKING CARE OF THIS PATIENT: 66minutes.   For discharge home today. Epifanio Lesches M.D on 10/11/2017 at 1:42 PM  Between 7am to 6pm - Pager - [phone]  After 6pm go to [email or web address] - password [email or web address] [facility]  Office  [phone]  CC: Primary care physician; Baxter Hire, MD   Note: This dictation was prepared with Dragon dictation along with smaller phrase technology. Any transcriptional errors that result from this process are unintentional.

## 2017-10-11 NOTE — Progress Notes (Signed)
Pharmacy Electrolyte Monitoring Consult:  Pharmacy consulted to assist in monitoring and replacing electrolytes in this 67 y.o. male admitted on 10/09/2017 with Hyperglycemia and hypokalemia  Labs:  Sodium (mmol/L)  Date Value  10/11/2017 138  09/14/2014 138   Potassium (mmol/L)  Date Value  10/11/2017 3.1 (L)  09/14/2014 3.4 (L)   Magnesium (mg/dL)  Date Value  10/11/2017 2.1  05/11/2014 2.1   Phosphorus (mg/dL)  Date Value  02/10/2015 3.1   Calcium (mg/dL)  Date Value  10/11/2017 9.3   Calcium, Total (mg/dL)  Date Value  09/14/2014 8.8   Albumin (g/dL)  Date Value  09/04/2017 2.4 (L)  09/11/2014 2.4 (L)    Plan: K 3.3, Mag 1.9 Patient currently on Lasix 40 mg po BID and spironolactone 25 mg daily and has KCL PO 20 meq BID ordered.  Scr 1.95, Crcl 53  Pt has received 60 MEQ of KCL today. K improved from 2.9 to 3.3. Pt on lasix and spironolactone. I will increase daily supplementation to 30 MEQ daily. Dose due tonight at 2200. Will recheck in AM.  1/11: K 3.1, Mag 2.1. Patient on lasix, spironolactone. Current order for KCL 30 meq PO bid. Will order KCL 40 meq PO x 1. Will recheck at 1800.   Chinita Greenland PharmD Clinical Pharmacist 10/11/2017

## 2017-10-11 NOTE — Transfer of Care (Signed)
Immediate Anesthesia Transfer of Care Note  Patient: Corey West  Procedure(s) Performed: ESOPHAGOGASTRODUODENOSCOPY (EGD) (Bilateral )  Patient Location: PACU and Endoscopy Unit  Anesthesia Type:General  Level of Consciousness: drowsy  Airway & Oxygen Therapy: Patient Spontanous Breathing and Patient connected to nasal cannula oxygen  Post-op Assessment: Report given to RN and Post -op Vital signs reviewed and stable  Post vital signs: Reviewed and stable  Last Vitals:  Vitals:   10/10/17 1238 10/11/17 0433  BP: (!) 145/62 (!) 143/59  Pulse: 77 74  Resp: 14 20  Temp: 36.6 C 36.5 C  SpO2: 100% 100%    Last Pain:  Vitals:   10/11/17 0433  TempSrc: Oral  PainSc:          Complications: No apparent anesthesia complications

## 2017-10-14 ENCOUNTER — Encounter: Payer: Self-pay | Admitting: Unknown Physician Specialty

## 2017-10-14 NOTE — Discharge Summary (Signed)
DEVERON SHAMOON, is a 67 y.o. male  DOB 03-31-1951  MRN [id number].  Admission date:  10/09/2017  Admitting Physician  [name], MD  Discharge Date:  10/11/2017   Primary MD  Baxter Hire, MD  Recommendations for primary care physician for things to follow:   With PCP in 1 week Follow-up with Dr. Vira Agar in 2-3 weeks.   Admission Diagnosis  Hepatic encephalopathy (HCC) [K72.90] Hypokalemia [E87.6] Confusion [R41.0] Hyperglycemia [R73.9]   Discharge Diagnosis  Hepatic encephalopathy (HCC) [K72.90] Hypokalemia [E87.6] Confusion [R41.0] Hyperglycemia [R73.9]   Principal Problem:   Hypokalemia Active Problems:   Hyperglycemia      Past Medical History:  Diagnosis Date  . Anemia   . Anxiety    controlled;   . Arthritis   . AVM (arteriovenous malformation) of stomach, acquired with hemorrhage   . Barrett's esophagus   . Chronic kidney disease    renal infufficiency  . Cirrhosis ([place])   . Depression    controlled;   Marland Kitchen Diabetes mellitus without complication ([place])    not controlled, taking insulin but sugar continues to run high;   . Edema   . Esophageal varices ([place])   . GERD (gastroesophageal reflux disease)   . History of hiatal hernia   . Hyperlipidemia   . Hypertension    controlled well;   . 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 Obesity   . Pancytopenia ([place])   . Polyp, stomach    with chronic blood loss  . Sleep apnea    does not wear a cpap, Medicare would not pay for it   . Venous stasis dermatitis of both lower extremities   . Venous stasis of both lower extremities    with cellulitis    Past Surgical History:  Procedure Laterality Date  . ESOPHAGOGASTRODUODENOSCOPY N/A 02/09/2015   Procedure: ESOPHAGOGASTRODUODENOSCOPY (EGD);  Surgeon: Manya Silvas, MD;  Location: [facility]  ENDOSCOPY;  Service: Endoscopy;  Laterality: N/A;  . ESOPHAGOGASTRODUODENOSCOPY N/A 07/22/2015   Procedure: ESOPHAGOGASTRODUODENOSCOPY (EGD);  Surgeon: Manya Silvas, MD;  Location: [facility] ENDOSCOPY;  Service: Endoscopy;  Laterality: N/A;  . ESOPHAGOGASTRODUODENOSCOPY N/A 06/26/2017   Procedure: ESOPHAGOGASTRODUODENOSCOPY (EGD);  Surgeon: Manya Silvas, MD;  Location: [facility] ENDOSCOPY;  Service: Endoscopy;  Laterality: N/A;  . ESOPHAGOGASTRODUODENOSCOPY N/A 06/27/2017   Procedure: ESOPHAGOGASTRODUODENOSCOPY (EGD);  Surgeon: Manya Silvas, MD;  Location: [facility] ENDOSCOPY;  Service: Endoscopy;  Laterality: N/A;  . ESOPHAGOGASTRODUODENOSCOPY N/A 06/28/2017   Procedure: ESOPHAGOGASTRODUODENOSCOPY (EGD);  Surgeon: Manya Silvas, MD;  Location[facility] ENDOSCOPY;  Service: Endoscopy;  Laterality: N/A;  . ESOPHAGOGASTRODUODENOSCOPY Bilateral 10/11/2017   Procedure: ESOPHAGOGASTRODUODENOSCOPY (EGD);  Surgeon: Manya Silvas, MD;  Location: [facility] ENDOSCOPY;  Service: Endoscopy;  Laterality: Bilateral;  . ESOPHAGOGASTRODUODENOSCOPY (EGD) WITH PROPOFOL N/A 07/20/2015   Procedure: ESOPHAGOGASTRODUODENOSCOPY (EGD) WITH PROPOFOL;  Surgeon: Manya Silvas, MD;  Location: [facility] ENDOSCOPY;  Service: Endoscopy;  Laterality: N/A;  . ESOPHAGOGASTRODUODENOSCOPY (EGD) WITH PROPOFOL N/A 09/16/2015   Procedure: ESOPHAGOGASTRODUODENOSCOPY (EGD) WITH PROPOFOL;  Surgeon: Manya Silvas, MD;  Location: [facility] ENDOSCOPY;  Service: Endoscopy;  Laterality: N/A;  . ESOPHAGOGASTRODUODENOSCOPY (EGD) WITH PROPOFOL N/A 03/16/2016   Procedure: ESOPHAGOGASTRODUODENOSCOPY (EGD) WITH PROPOFOL;  Surgeon: Manya Silvas, MD;  Location[facility] ENDOSCOPY;  Service: Endoscopy;  Laterality: N/A;  . ESOPHAGOGASTRODUODENOSCOPY (EGD) WITH PROPOFOL N/A 09/14/2016   Procedure: ESOPHAGOGASTRODUODENOSCOPY (EGD) WITH PROPOFOL;  Surgeon: Manya Silvas, MD;  Location: [facility] ENDOSCOPY;  Service: Endoscopy;  Laterality: N/A;  .  ESOPHAGOGASTRODUODENOSCOPY (EGD) WITH PROPOFOL N/A 11/05/2016  Procedure: ESOPHAGOGASTRODUODENOSCOPY (EGD) WITH PROPOFOL;  Surgeon: Manya Silvas, MD;  Location: [facility] ENDOSCOPY;  Service: Endoscopy;  Laterality: N/A;  . ESOPHAGOGASTRODUODENOSCOPY (EGD) WITH PROPOFOL N/A 02/06/2017   Procedure: ESOPHAGOGASTRODUODENOSCOPY (EGD) WITH PROPOFOL;  Surgeon: Manya Silvas, MD;  Location: [facility] ENDOSCOPY;  Service: Endoscopy;  Laterality: N/A;  . ESOPHAGOGASTRODUODENOSCOPY (EGD) WITH PROPOFOL N/A 04/24/2017   Procedure: ESOPHAGOGASTRODUODENOSCOPY (EGD) WITH PROPOFOL;  Surgeon: Manya Silvas, MD;  Location: [facility] ENDOSCOPY;  Service: Endoscopy;  Laterality: N/A;  . ESOPHAGOGASTRODUODENOSCOPY (EGD) WITH PROPOFOL N/A 07/31/2017   Procedure: ESOPHAGOGASTRODUODENOSCOPY (EGD) WITH PROPOFOL;  Surgeon: Manya Silvas, MD;  Location: [facility] ENDOSCOPY;  Service: Endoscopy;  Laterality: N/A;  . TONSILLECTOMY    . TONSILLECTOMY AND ADENOIDECTOMY    . ULNAR NERVE TRANSPOSITION    . UVULOPALATOPHARYNGOPLASTY         History of present illness and  Hospital Course:     Kindly see H&P for history of present illness and admission details, please review complete Labs, Consult reports and Test reports for all details in brief  HPI  from the history and physical done on the day of admission 67 year old male patient with multiple medical problems the liver cirrhosis, hypertension, diabetes mellitus type 2 ,history of AV malformations status post EGD before, scheduled to have a repeat EGD this Friday by Dr. Vira Agar office because of abnormal blood test severe hypokalemia and hyperglycemia, hypokalemia.  With her blood sugar 543, sodium 129, potassium 2.8, anion gap of 11.   Hospital Course  #1. severe hypokalemia due to diuretics: Improved, patient can continue potassium supplements at discharge.  Can take KCl 20 p.o. twice daily for 3 days followed by 20 Meq q. p.o. daily.  Instructed the patient. Potassium  improved from 2.9-3.3 and then dropped to 3.1.  So we advised the patient today KCl 20 meq p.o. twice daily for 3 days followed by 20 meq  p.o. Daily .   #3. hyponatremia: Improved with fluids.  Held his Zaroxolyn due to hyponatremia.  #4. history of AV malformations gets EGD by Dr. Lytle Michaels post EGD, no of polyps, AV malformations are identified.  Patient has Barrett's but not changed from before..  Did not recommend any intervention.  Advised the patient to follow-up in 2-3 weeks for CBC again. Patient is eager to go home.  Patient can continue PPIs.  5.  Acute on chronic anemia, hemoglobin stable at 7.9.  Small drop from 8.7-7.9 likely due to dilutional.  6.  History of liver cirrhosis, continue Aldactone, Lasix, Zaroxolyn.  7.:  Acute on chronic renal failure, baseline creatinine 1.9, creatinine improved from 2.2-1.95.  Diabetes mellitus type 2: Hyperglycemia is corrected, seen by by diabetic nurse, who recommended to restart 70/30 at discharge, patient takes 84 units in the morning, 65 units with supper.  Advised the patient to continue that.  Patient was given Levemir, NovoLog while in the hospital.       Discharge Condition: Stable   Follow UP  Follow-up Information    Baxter Hire, MD. Go on 10/25/2017.   Specialty:  Internal Medicine Why:  at 11:45am for hospital follow-up Contact information: [place] [address] [phone]        Manya Silvas, MD. Go on 10/24/2017.   Specialty:  Gastroenterology Why:  at 4:00pm for hospital follow-up Contact information: [place] [address] [phone]             Discharge Instructions  and  Discharge Medications  Allergies as of 10/11/2017   No Known Allergies     Medication List    TAKE these medications   acidophilus Caps capsule Take 2 capsules by mouth 3 (three) times daily.   ammonium lactate 12 % cream Commonly known as:   AMLACTIN Apply topically 2 (two) times daily.   FERROUS SULFATE ER PO Take 325 mg by mouth 2 (two) times daily.   furosemide 40 MG tablet Commonly known as:  LASIX Take 40 mg by mouth 2 (two) times daily.   Gerhardt's butt cream Crea Apply 1 application topically 2 (two) times daily.   insulin aspart protamine- aspart (70-30) 100 UNIT/ML injection Commonly known as:  NOVOLOG MIX 70/30 Inject 0.5 mLs (50 Units total) into the skin 2 (two) times daily with a meal. Pt uses 84 units with breakfast and 62 units with supper. What changed:    how much to take  additional instructions   lactulose 10 GM/15ML solution Commonly known as:  CHRONULAC Take 30 g by mouth at bedtime.   LORazepam 0.5 MG tablet Commonly known as:  ATIVAN Take 0.5 mg by mouth 3 (three) times daily as needed for anxiety.   metolazone 2.5 MG tablet Commonly known as:  ZAROXOLYN Take 2.5 mg by mouth daily.   multivitamin tablet Take 1 tablet by mouth daily.   omeprazole 40 MG capsule Commonly known as:  PRILOSEC Take 40 mg by mouth 2 (two) times daily.   potassium chloride SA 20 MEQ tablet Commonly known as:  K-DUR,KLOR-CON Take 1 tablet (20 mEq total) by mouth 2 (two) times daily. Take BID for 2 days after  That resume once aday What changed:    when to take this  additional instructions   spironolactone 25 MG tablet Commonly known as:  ALDACTONE Take 25 mg by mouth daily.   traMADol 50 MG tablet Commonly known as:  ULTRAM Take 50 mg by mouth every 8 (eight) hours as needed.         Diet and Activity recommendation: See Discharge Instructions above   Consults obtained -gastroenterology with Dr. Vira Agar   Major procedures and Radiology Reports - PLEASE review detailed and final reports for all details, in brief -      Dg Chest 2 View  Result Date: 10/09/2017 CLINICAL DATA:  Hyperglycemia. EXAM: CHEST  2 VIEW COMPARISON:  06/12/2017 FINDINGS: The lungs are clear without focal  pneumonia, edema, pneumothorax or pleural effusion. There is pulmonary vascular congestion without overt pulmonary edema. The cardiopericardial silhouette is within normal limits for size. The visualized bony structures of the thorax are intact. IMPRESSION: No active cardiopulmonary disease. Electronically Signed   By: Misty Stanley M.D.   On: 10/09/2017 16:36    Micro Results     No results found for this or any previous visit (from the past 240 hour(s)).     Today   Subjective:   Nashton Belson today has no headache,no chest abdominal pain,no new weakness tingling or numbness, feels much better wants to go home today.   Objective:   Blood pressure 133/62, pulse 70, temperature 97.7 F (36.5 C), temperature source Oral, resp. rate 16, height 5\' 9"  (1.753 m), weight (!) 140.6 kg (310 lb), SpO2 99 %.  No intake or output data in the 24 hours ending 10/14/17 2244  Exam Awake Alert, Oriented x 3, No new F.N deficits, Normal affect [place].AT,PERRAL Supple Neck,No JVD, No cervical lymphadenopathy appriciated.  Symmetrical Chest wall movement, Good air movement bilaterally, CTAB RRR,No Gallops,Rubs  or new Murmurs, No Parasternal Heave +ve B.Sounds, Abd Soft, Non tender, No organomegaly appriciated, No rebound -guarding or rigidity. No Cyanosis, Clubbing or edema, No new Rash or bruise  Data Review   CBC w Diff:  Lab Results  Component Value Date   WBC 5.4 10/10/2017   HGB 7.9 (L) 10/10/2017   HGB 9.7 (L) 09/14/2014   HCT 22.7 (L) 10/10/2017   HCT 28.2 (L) 09/14/2014   PLT 102 (L) 10/10/2017   PLT 106 (L) 09/14/2014   LYMPHOPCT 11 09/05/2017   LYMPHOPCT 16.2 09/14/2014   MONOPCT 6 09/05/2017   MONOPCT 6.2 09/14/2014   EOSPCT 3 09/05/2017   EOSPCT 3.9 09/14/2014   BASOPCT 0 09/05/2017   BASOPCT 0.6 09/14/2014    CMP:  Lab Results  Component Value Date   NA 138 10/11/2017   NA 138 09/14/2014   K 3.1 (L) 10/11/2017   K 3.4 (L) 09/14/2014   CL 100 (L) 10/11/2017   CL 104  09/14/2014   CO2 29 10/11/2017   CO2 28 09/14/2014   BUN 31 (H) 10/11/2017   BUN 18 09/14/2014   CREATININE 1.98 (H) 10/11/2017   CREATININE 2.05 (H) 09/14/2014   PROT 5.9 (L) 09/04/2017   PROT 6.4 09/11/2014   ALBUMIN 2.4 (L) 09/04/2017   ALBUMIN 2.4 (L) 09/11/2014   BILITOT 0.6 09/04/2017   BILITOT 1.0 09/11/2014   ALKPHOS 56 09/04/2017   ALKPHOS 52 09/11/2014   AST 26 09/04/2017   AST 18 09/11/2014   ALT 19 09/04/2017   ALT 18 09/11/2014  .   Total Time in preparing paper work, data evaluation and todays exam - 35 minutes  Epifanio Lesches M.D on 10/11/2017 at 10:44 PM    Note: This dictation was prepared with Dragon dictation along with smaller phrase technology. Any transcriptional errors that result from this process are unintentional.

## 2017-10-16 ENCOUNTER — Encounter: Payer: Medicare Other | Admitting: Internal Medicine

## 2017-10-17 ENCOUNTER — Emergency Department: Payer: Medicare Other

## 2017-10-17 ENCOUNTER — Inpatient Hospital Stay
Admission: EM | Admit: 2017-10-17 | Discharge: 2017-10-19 | DRG: 433 | Disposition: A | Discharge status: Home Health | Payer: Medicare Other | Attending: Internal Medicine | Admitting: Internal Medicine

## 2017-10-17 DIAGNOSIS — K449 Diaphragmatic hernia without obstruction or gangrene: Secondary | ICD-10-CM | POA: Diagnosis present

## 2017-10-17 DIAGNOSIS — K219 Gastro-esophageal reflux disease without esophagitis: Secondary | ICD-10-CM | POA: Diagnosis present

## 2017-10-17 DIAGNOSIS — K7682 Hepatic encephalopathy: Secondary | ICD-10-CM | POA: Diagnosis present

## 2017-10-17 DIAGNOSIS — K7040 Alcoholic hepatic failure without coma: Secondary | ICD-10-CM | POA: Diagnosis not present

## 2017-10-17 DIAGNOSIS — F419 Anxiety disorder, unspecified: Secondary | ICD-10-CM | POA: Diagnosis present

## 2017-10-17 DIAGNOSIS — I872 Venous insufficiency (chronic) (peripheral): Secondary | ICD-10-CM | POA: Diagnosis present

## 2017-10-17 DIAGNOSIS — D638 Anemia in other chronic diseases classified elsewhere: Secondary | ICD-10-CM | POA: Diagnosis present

## 2017-10-17 DIAGNOSIS — K2270 Barrett's esophagus without dysplasia: Secondary | ICD-10-CM | POA: Diagnosis present

## 2017-10-17 DIAGNOSIS — E1122 Type 2 diabetes mellitus with diabetic chronic kidney disease: Secondary | ICD-10-CM | POA: Diagnosis present

## 2017-10-17 DIAGNOSIS — R4182 Altered mental status, unspecified: Secondary | ICD-10-CM

## 2017-10-17 DIAGNOSIS — E1121 Type 2 diabetes mellitus with diabetic nephropathy: Secondary | ICD-10-CM | POA: Diagnosis present

## 2017-10-17 DIAGNOSIS — K7030 Alcoholic cirrhosis of liver without ascites: Secondary | ICD-10-CM | POA: Diagnosis present

## 2017-10-17 DIAGNOSIS — Z6841 Body Mass Index (BMI) 40.0 and over, adult: Secondary | ICD-10-CM

## 2017-10-17 DIAGNOSIS — I8500 Esophageal varices without bleeding: Secondary | ICD-10-CM | POA: Diagnosis present

## 2017-10-17 DIAGNOSIS — D631 Anemia in chronic kidney disease: Secondary | ICD-10-CM | POA: Diagnosis present

## 2017-10-17 DIAGNOSIS — E7220 Disorder of urea cycle metabolism, unspecified: Secondary | ICD-10-CM

## 2017-10-17 DIAGNOSIS — I129 Hypertensive chronic kidney disease with stage 1 through stage 4 chronic kidney disease, or unspecified chronic kidney disease: Secondary | ICD-10-CM | POA: Diagnosis present

## 2017-10-17 DIAGNOSIS — R400 Somnolence: Secondary | ICD-10-CM

## 2017-10-17 DIAGNOSIS — E876 Hypokalemia: Secondary | ICD-10-CM | POA: Diagnosis not present

## 2017-10-17 DIAGNOSIS — G4730 Sleep apnea, unspecified: Secondary | ICD-10-CM | POA: Diagnosis present

## 2017-10-17 DIAGNOSIS — E785 Hyperlipidemia, unspecified: Secondary | ICD-10-CM | POA: Diagnosis present

## 2017-10-17 DIAGNOSIS — Z794 Long term (current) use of insulin: Secondary | ICD-10-CM

## 2017-10-17 DIAGNOSIS — K7290 Hepatic failure, unspecified without coma: Secondary | ICD-10-CM | POA: Diagnosis present

## 2017-10-17 DIAGNOSIS — F17290 Nicotine dependence, other tobacco product, uncomplicated: Secondary | ICD-10-CM | POA: Diagnosis present

## 2017-10-17 DIAGNOSIS — F1021 Alcohol dependence, in remission: Secondary | ICD-10-CM | POA: Diagnosis present

## 2017-10-17 DIAGNOSIS — N183 Chronic kidney disease, stage 3 (moderate): Secondary | ICD-10-CM | POA: Diagnosis present

## 2017-10-17 LAB — BLOOD GAS, VENOUS
Acid-Base Excess: 1.9 mmol/L (ref 0.0–2.0)
BICARBONATE: 25.5 mmol/L (ref 20.0–28.0)
O2 SAT: 99.7 %
PATIENT TEMPERATURE: 37
PCO2 VEN: 35 mmHg — AB (ref 44.0–60.0)
PO2 VEN: 188 mmHg — AB (ref 32.0–45.0)
pH, Ven: 7.47 — ABNORMAL HIGH (ref 7.250–7.430)

## 2017-10-17 LAB — COMPREHENSIVE METABOLIC PANEL
ALT: 24 U/L (ref 17–63)
AST: 38 U/L (ref 15–41)
Albumin: 3.1 g/dL — ABNORMAL LOW (ref 3.5–5.0)
Alkaline Phosphatase: 77 U/L (ref 38–126)
Anion gap: 10 (ref 5–15)
BUN: 31 mg/dL — AB (ref 6–20)
CHLORIDE: 108 mmol/L (ref 101–111)
CO2: 22 mmol/L (ref 22–32)
CREATININE: 1.86 mg/dL — AB (ref 0.61–1.24)
Calcium: 9 mg/dL (ref 8.9–10.3)
GFR calc Af Amer: 42 mL/min — ABNORMAL LOW (ref >60)
GFR calc non Af Amer: 36 mL/min — ABNORMAL LOW (ref >60)
Glucose, Bld: 106 mg/dL — ABNORMAL HIGH (ref 65–99)
POTASSIUM: 3.3 mmol/L — AB (ref 3.5–5.1)
SODIUM: 140 mmol/L (ref 135–145)
Total Bilirubin: 0.8 mg/dL (ref 0.3–1.2)
Total Protein: 6.6 g/dL (ref 6.5–8.1)

## 2017-10-17 LAB — LACTIC ACID, PLASMA: Lactic Acid, Venous: 2.1 mmol/L (ref 0.5–1.9)

## 2017-10-17 LAB — CBC
HEMATOCRIT: 23.3 % — AB (ref 40.0–52.0)
Hemoglobin: 7.7 g/dL — ABNORMAL LOW (ref 13.0–18.0)
MCH: 31.4 pg (ref 26.0–34.0)
MCHC: 33.1 g/dL (ref 32.0–36.0)
MCV: 94.9 fL (ref 80.0–100.0)
Platelets: 117 10*3/uL — ABNORMAL LOW (ref 150–440)
RBC: 2.46 MIL/uL — ABNORMAL LOW (ref 4.40–5.90)
RDW: 16.7 % — AB (ref 11.5–14.5)
WBC: 5.5 10*3/uL (ref 3.8–10.6)

## 2017-10-17 LAB — URINE DRUG SCREEN, QUALITATIVE (ARMC ONLY)
Amphetamines, Ur Screen: NOT DETECTED
BARBITURATES, UR SCREEN: NOT DETECTED
Benzodiazepine, Ur Scrn: POSITIVE — AB
Cannabinoid 50 Ng, Ur ~~LOC~~: NOT DETECTED
Cocaine Metabolite,Ur ~~LOC~~: NOT DETECTED
MDMA (ECSTASY) UR SCREEN: NOT DETECTED
METHADONE SCREEN, URINE: NOT DETECTED
OPIATE, UR SCREEN: NOT DETECTED
Phencyclidine (PCP) Ur S: NOT DETECTED
Tricyclic, Ur Screen: NOT DETECTED

## 2017-10-17 LAB — TROPONIN I

## 2017-10-17 LAB — ETHANOL: Alcohol, Ethyl (B): <10 mg/dL (ref <10)

## 2017-10-17 LAB — AMMONIA: AMMONIA: 80 umol/L — AB (ref 9–35)

## 2017-10-17 LAB — GLUCOSE, CAPILLARY: GLUCOSE-CAPILLARY: 69 mg/dL (ref 65–99)

## 2017-10-17 MED ORDER — SODIUM CHLORIDE 0.9 % IV BOLUS (SEPSIS)
1000.0000 mL | Freq: Once | INTRAVENOUS | Status: AC
  Administered 2017-10-17: 1000 mL via INTRAVENOUS

## 2017-10-17 MED ORDER — POTASSIUM CHLORIDE CRYS ER 20 MEQ PO TBCR
40.0000 meq | EXTENDED_RELEASE_TABLET | Freq: Once | ORAL | Status: AC
  Administered 2017-10-17: 40 meq via ORAL
  Filled 2017-10-17: qty 2

## 2017-10-17 MED ORDER — LACTULOSE 10 GM/15ML PO SOLN
30.0000 g | Freq: Once | ORAL | Status: AC
  Administered 2017-10-17: 30 g via ORAL
  Filled 2017-10-17: qty 60

## 2017-10-17 NOTE — ED Notes (Signed)
Pt assisted with urinal, pt keeps dozing off while standing and using urinal

## 2017-10-17 NOTE — ED Provider Notes (Signed)
[facility] Emergency Department Provider Note  ____________________________________________  Time seen: Approximately 7:31 PM  I have reviewed the triage vital signs and the nursing notes.   HISTORY  Chief Complaint Altered Mental Status  The patient's history is limited due to his altered mental status.  HPI Corey West is a 67 y.o. male with a history of cirrhosis, HTN, HL, DM, brought to the emergency department for confusion and somnolence.  The patient is brought by his niece; the patient lives alone his niece talks to him every day.  She has noted that he has been confused for the past several days.  4 days ago, the patient went to work when it was a Sunday, and did not understand why everybody else was in there.  He also has been making mistakes with his medications.  There is no history of trauma, fever, nausea vomiting or diarrhea, abdominal pain, chest pain.  Past Medical History:  Diagnosis Date  . Anemia   . Anxiety    controlled;   . Arthritis   . AVM (arteriovenous malformation) of stomach, acquired with hemorrhage   . Barrett's esophagus   . Chronic kidney disease    renal infufficiency  . Cirrhosis ([place])   . Depression    controlled;   Marland Kitchen Diabetes mellitus without complication ([place])    not controlled, taking insulin but sugar continues to run high;   . Edema   . Esophageal varices ([place])   . GERD (gastroesophageal reflux disease)   . History of hiatal hernia   . Hyperlipidemia   . Hypertension    controlled well;   . 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 Obesity   . Pancytopenia ([place])   . Polyp, stomach    with chronic blood loss  . Sleep apnea    does not wear a cpap, Medicare would not pay for it   . Venous stasis dermatitis of both lower extremities   . Venous stasis of both lower extremities    with cellulitis    Patient Active Problem List   Diagnosis Date Noted  . Hypokalemia 10/09/2017  . Hyperglycemia 10/09/2017  .  Symptomatic anemia 06/26/2017  . Sepsis ([place]) 06/12/2017  . Cellulitis in diabetic foot ([place]) 02/21/2017  . Uncontrolled diabetes mellitus ([place]) 02/21/2017  . Cellulitis 02/21/2017  . GI bleed 07/20/2015  . Hematemesis 02/08/2015  . Cirrhosis ([place]) 02/08/2015  . Esophageal varices ([place]) 02/08/2015  . Type 2 diabetes mellitus ([place]) 02/08/2015  . Gastroesophageal reflux disease 02/08/2015  . Hyperlipidemia 02/08/2015  . Anemia 12/04/2013  . Thrombocytopenia ([place]) 12/04/2013  . Splenomegaly 11/04/2013    Past Surgical History:  Procedure Laterality Date  . ESOPHAGOGASTRODUODENOSCOPY N/A 02/09/2015   Procedure: ESOPHAGOGASTRODUODENOSCOPY (EGD);  Surgeon: Manya Silvas, MD;  Location: [facility] ENDOSCOPY;  Service: Endoscopy;  Laterality: N/A;  . ESOPHAGOGASTRODUODENOSCOPY N/A 07/22/2015   Procedure: ESOPHAGOGASTRODUODENOSCOPY (EGD);  Surgeon: Manya Silvas, MD;  Location: [facility] ENDOSCOPY;  Service: Endoscopy;  Laterality: N/A;  . ESOPHAGOGASTRODUODENOSCOPY N/A 06/26/2017   Procedure: ESOPHAGOGASTRODUODENOSCOPY (EGD);  Surgeon: Manya Silvas, MD;  Location: [facility] ENDOSCOPY;  Service: Endoscopy;  Laterality: N/A;  . ESOPHAGOGASTRODUODENOSCOPY N/A 06/27/2017   Procedure: ESOPHAGOGASTRODUODENOSCOPY (EGD);  Surgeon: Manya Silvas, MD;  Location: [facility] ENDOSCOPY;  Service: Endoscopy;  Laterality: N/A;  . ESOPHAGOGASTRODUODENOSCOPY N/A 06/28/2017   Procedure: ESOPHAGOGASTRODUODENOSCOPY (EGD);  Surgeon: Manya Silvas, MD;  Location: [facility] ENDOSCOPY;  Service: Endoscopy;  Laterality: N/A;  . ESOPHAGOGASTRODUODENOSCOPY Bilateral 10/11/2017   Procedure: ESOPHAGOGASTRODUODENOSCOPY (EGD);  Surgeon:  Manya Silvas, MD;  Location: [facility] ENDOSCOPY;  Service: Endoscopy;  Laterality: Bilateral;  . ESOPHAGOGASTRODUODENOSCOPY (EGD) WITH PROPOFOL N/A 07/20/2015   Procedure: ESOPHAGOGASTRODUODENOSCOPY (EGD) WITH PROPOFOL;  Surgeon: Manya Silvas, MD;  Location: [facility] ENDOSCOPY;  Service: Endoscopy;   Laterality: N/A;  . ESOPHAGOGASTRODUODENOSCOPY (EGD) WITH PROPOFOL N/A 09/16/2015   Procedure: ESOPHAGOGASTRODUODENOSCOPY (EGD) WITH PROPOFOL;  Surgeon: Manya Silvas, MD;  Location: [facility] ENDOSCOPY;  Service: Endoscopy;  Laterality: N/A;  . ESOPHAGOGASTRODUODENOSCOPY (EGD) WITH PROPOFOL N/A 03/16/2016   Procedure: ESOPHAGOGASTRODUODENOSCOPY (EGD) WITH PROPOFOL;  Surgeon: Manya Silvas, MD;  Location: [facility] ENDOSCOPY;  Service: Endoscopy;  Laterality: N/A;  . ESOPHAGOGASTRODUODENOSCOPY (EGD) WITH PROPOFOL N/A 09/14/2016   Procedure: ESOPHAGOGASTRODUODENOSCOPY (EGD) WITH PROPOFOL;  Surgeon: Manya Silvas, MD;  Location: [facility] ENDOSCOPY;  Service: Endoscopy;  Laterality: N/A;  . ESOPHAGOGASTRODUODENOSCOPY (EGD) WITH PROPOFOL N/A 11/05/2016   Procedure: ESOPHAGOGASTRODUODENOSCOPY (EGD) WITH PROPOFOL;  Surgeon: Manya Silvas, MD;  Location: [facility] ENDOSCOPY;  Service: Endoscopy;  Laterality: N/A;  . ESOPHAGOGASTRODUODENOSCOPY (EGD) WITH PROPOFOL N/A 02/06/2017   Procedure: ESOPHAGOGASTRODUODENOSCOPY (EGD) WITH PROPOFOL;  Surgeon: Manya Silvas, MD;  Location: [facility] ENDOSCOPY;  Service: Endoscopy;  Laterality: N/A;  . ESOPHAGOGASTRODUODENOSCOPY (EGD) WITH PROPOFOL N/A 04/24/2017   Procedure: ESOPHAGOGASTRODUODENOSCOPY (EGD) WITH PROPOFOL;  Surgeon: Manya Silvas, MD;  Location: [facility] ENDOSCOPY;  Service: Endoscopy;  Laterality: N/A;  . ESOPHAGOGASTRODUODENOSCOPY (EGD) WITH PROPOFOL N/A 07/31/2017   Procedure: ESOPHAGOGASTRODUODENOSCOPY (EGD) WITH PROPOFOL;  Surgeon: Manya Silvas, MD;  Location: [facility] ENDOSCOPY;  Service: Endoscopy;  Laterality: N/A;  . TONSILLECTOMY    . TONSILLECTOMY AND ADENOIDECTOMY    . ULNAR NERVE TRANSPOSITION    . UVULOPALATOPHARYNGOPLASTY      Current Outpatient Rx  . Order #: [id number] Class: Historical Med  . Order #: [id number] Class: Historical Med  . Order #: [id number] Class: Normal  . Order #: [id number] Class: Historical Med  . Order #: [id number] Class:  Historical Med  . Order #: [id number] Class: Historical Med  . Order #: [id number] Class: Historical Med  . Order #: [id number] Class: Historical Med  . Order #: [id number] Class: Historical Med  . Order #: [id number] Class: Print  . Order #: [id number] Class: Historical Med  . Order #: [id number] Class: Print  . Order #: [id number] Class: Historical Med  . Order #: [id number] Class: Normal  . Order #: [id number] Class: Historical Med    Allergies Patient has no known allergies.  Family History  Problem Relation Age of Onset  . Diabetes Other     Social History Social History   Tobacco Use  . Smoking status: Current Some Day Smoker    Types: Cigars  . Smokeless tobacco: Never Used  . Tobacco comment: smoke 1 at night occasionally;   Substance Use Topics  . Alcohol use: No    Comment: stopped 12 years ago  . Drug use: No    Review of Systems Constitutional: No fever/chills.  No lightheadedness or syncope.  No trauma.  Positive altered mental status.  Positive somnolence.   Eyes: No visual changes.  No eye discharge. ENT: No sore throat. No congestion or rhinorrhea. Cardiovascular: Denies chest pain. Denies palpitations. Respiratory: Denies shortness of breath.  No cough. Gastrointestinal: No abdominal pain.  No nausea, no vomiting.  No diarrhea.  No constipation. Genitourinary: Negative for dysuria. Musculoskeletal: Negative for back pain. Skin: Negative for rash. Neurological: Negative for headaches. No focal numbness, tingling or weakness.     ____________________________________________   PHYSICAL EXAM:  VITAL SIGNS: ED Triage Vitals [10/17/17 1804]  Enc Vitals Group  BP (!) 126/46     Pulse Rate 80     Resp 18     Temp 98.6 F (37 C)     Temp Source Oral     SpO2 100 %     Weight (!) 310 lb (140.6 kg)     Height      Head Circumference      Peak Flow      Pain Score      Pain Loc      Pain Edu?      Excl. in [place]?     Constitutional: The patient is alert and  oriented x3, but falls asleep in the middle of his sentences.  He is nontoxic with stable VS.   Eyes: Conjunctivae are normal.  EOMI. PERRLA.  No scleral icterus. Head: Atraumatic.  No battle sign. Nose: No congestion/rhinnorhea. Mouth/Throat: Mucous membranes are mildly dry.  Neck: No stridor.  Supple.  No JVD.  No meningismus. Cardiovascular: Normal rate, regular rhythm. No murmurs, rubs or gallops.  Respiratory: Normal respiratory effort.  No accessory muscle use or retractions. Lungs CTAB.  No wheezes, rales or ronchi. Gastrointestinal: Morbidly obese.  Soft, nontender and nondistended.  No guarding or rebound.  No peritoneal signs. Musculoskeletal: Positive bilateral symmetric lower extremity edema.. No ttp in the calves or palpable cords.  Negative Homan's sign. Neurologic:  A&Ox3.  Speech is mildly slurred..  Face and smile are symmetric.  EOMI. PERRLA.   moves all extremities well.  Patient is too somnolent to participate in a full motor and sensory neuro examination. Skin:  Skin is warm, dry and intact. No rash noted. Psychiatric: Mood and affect are normal.  ____________________________________________   LABS (all labs ordered are listed, but only abnormal results are displayed)  Labs Reviewed  CBC - Abnormal; Notable for the following components:      Result Value   RBC 2.46 (*)    Hemoglobin 7.7 (*)    HCT 23.3 (*)    RDW 16.7 (*)    Platelets 117 (*)    All other components within normal limits  COMPREHENSIVE METABOLIC PANEL - Abnormal; Notable for the following components:   Potassium 3.3 (*)    Glucose, Bld 106 (*)    BUN 31 (*)    Creatinine, Ser 1.86 (*)    Albumin 3.1 (*)    GFR calc non Af Amer 36 (*)    GFR calc Af Amer 42 (*)    All other components within normal limits  LACTIC ACID, PLASMA - Abnormal; Notable for the following components:   Lactic Acid, Venous 2.1 (*)    All other components within normal limits  AMMONIA - Abnormal; Notable for the  following components:   Ammonia 80 (*)    All other components within normal limits  BLOOD GAS, VENOUS - Abnormal; Notable for the following components:   pH, Ven 7.47 (*)    pCO2, Ven 35 (*)    pO2, Ven 188.0 (*)    All other components within normal limits  URINE DRUG SCREEN, QUALITATIVE ([facility] ONLY) - Abnormal; Notable for the following components:   Benzodiazepine, Ur Scrn POSITIVE (*)    All other components within normal limits  ETHANOL  TROPONIN I  GLUCOSE, CAPILLARY   ____________________________________________  EKG  ED ECG REPORT I, Eula Listen, the attending physician, personally viewed and interpreted this ECG.   Date: 10/17/2017  EKG Time: 1757  Rate: 78  Rhythm: normal sinus rhythm  Axis: leftward  Intervals:first-degree A-V block   ST&T Change: No STEMI  ____________________________________________  RADIOLOGY  Dg Chest 2 View  Result Date: 10/17/2017 CLINICAL DATA:  Altered mental status. EXAM: CHEST  2 VIEW COMPARISON:  Chest x-ray dated October 09, 2017. FINDINGS: The heart size and mediastinal contours are within normal limits. Persistent mild pulmonary vascular congestion. Low lung volumes with bronchovascular crowding and bibasilar atelectasis. No focal consolidation, pleural effusion, or pneumothorax. No acute osseous abnormality. IMPRESSION: Low lung volumes.  No active cardiopulmonary disease. Electronically Signed   By: Titus Dubin M.D.   On: 10/17/2017 20:10   Ct Head Wo Contrast  Result Date: 10/17/2017 CLINICAL DATA:  Pt brought in by niece from home with reports of dizziness and increased confusion. Pt is AANDOx4, but per niece pt has been having recent bizarre behavior, including thinking someone ransacked his house and taking several days worth of medication or not any at all. EXAM: CT HEAD WITHOUT CONTRAST TECHNIQUE: Contiguous axial images were obtained from the base of the skull through the vertex without intravenous contrast.  COMPARISON:  02/21/2017 FINDINGS: Brain: There is central and cortical atrophy. There is no intra or extra-axial fluid collection or mass lesion. The basilar cisterns and ventricles have a normal appearance. There is no CT evidence for acute infarction or hemorrhage. Vascular: There is atherosclerotic calcifications of the carotic siphons. Skull: Normal. Negative for fracture or focal lesion. Sinuses/Orbits: There is mild mucosal thickening of the paranasal sinuses. Other: None IMPRESSION: 1.  No evidence for acute  abnormality. 2. Mild atrophy. Electronically Signed   By: Nolon Nations M.D.   On: 10/17/2017 20:02    ____________________________________________   PROCEDURES  Procedure(s) performed: None  Procedures  Critical Care performed: No ____________________________________________   INITIAL IMPRESSION / ASSESSMENT AND PLAN / ED COURSE  Pertinent labs & imaging results that were available during my care of the patient were reviewed by me and considered in my medical decision making (see chart for details).  67 y.o. male with a history of cirrhosis and multiple other chronic comorbidities presenting with several days of progressively worsening confusion and somnolence.  The patient has had some medication mistakes, so this may be iatrogenic.  I am also concerned about hyperammonemia.  We will check for dehydration and uremia.  We will evaluate the patient for infection including UTI.  There are no signs or symptoms I would be consistent with influenza at this time.  The patient has no focal neurologic deficits but CVA is possible also a CT scan has been ordered.  Plan reevaluation for final disposition.  ----------------------------------------- 10:31 PM on 10/17/2017 -----------------------------------------  I reviewed the patient's medical chart.  The patient does have hyperammonemia has been given lactulose.  His CT scan does not show any acute intracranial process.  Patient  has chronic and unchanged renal insufficiency anemia.  He continues to be hemodynamically stable but somnolent.  At this time, we will plan to admit the patient for further evaluation and treatment.   ____________________________________________  FINAL CLINICAL IMPRESSION(S) / ED DIAGNOSES  Final diagnoses:  Altered mental status, unspecified altered mental status type  Somnolence  Hyperammonemia ([place])         NEW MEDICATIONS STARTED DURING THIS VISIT:  New Prescriptions   No medications on file      Eula Listen, MD 10/17/17 2232

## 2017-10-17 NOTE — ED Notes (Signed)
Date and time results received: 10/17/17 1918 (use smartphrase ".now" to insert current time)  Test: lacric Critical Value: 2.1  Name of Provider Notified: Mariea Clonts  Orders Received? Or Actions Taken?: Orders Received - See Orders for details

## 2017-10-17 NOTE — ED Triage Notes (Signed)
Pt brought in by niece from home with reports of dizziness and increased confusion.  Pt is A&Ox4, but per niece pt has been having recent bizarre behavior, including thinking someone ransacked his house and taking several days worth of medication or not any.  Pt also showing up for work on the wrong days or not showing up at all.  Spoke with Dr. Joni Fears and VO given for lactic acid and ammonia levels.  Pt reports being on recent abx for cellulitis in leg.  Pt falling asleep during triage.

## 2017-10-17 NOTE — Progress Notes (Signed)
Corey West, Corey West ([id number]) Visit Report for 10/16/2017 HPI Details Patient Name: Corey West, Corey West. Date of Service: 10/16/2017 9:15 AM Medical Record Number: [id number] Patient Account Number: [id number] Date of Birth/Sex: August 08, 1951 (67 y.o. Male) Treating RN: Cornell Barman Primary Care Provider: [name] Other Clinician: Referring Provider: [name] Treating Provider/Extender: Tito Dine in Treatment: 25 History of Present Illness HPI Description: Pleasant 68 year old with a past medical history significant for morbid obesity, insulin-dependent type 2 diabetes (hemoglobin A1c 7.2 in January 2015), cirrhosis, and chronic kidney disease. He reports a left anterior calf ulcer since January 2015. He has had venous stasis ulcers in the past, which have healed spontaneously. No history of DVT. Does not regularly wear compression. Ambulating normally per his baseline. Recently completed a course of antibiotics. Treated with a Profore light and Coban 2 layer compression drainage, which both fell down and he cut off. Tolerated Coban lite compression bandage. Returns to clinic for followup. No complaints today. No pain at this time. No fever or chills. No open wounds at this time. He is having difficulty finding compression garments that fit him. 04/23/17; this is now 67 year old man who returns to our clinic for nonhealing wounds on the right lateral and right posterior calf. He tells me that they have been present for 6 months. In the setting of severe right greater than left lower extremity lymphedema likely secondary to chronic venous insufficiency. He has been followed in the lymphedema clinic at least since early June with compression wraps although these have not really helped and the patient is here for our review of this situation. The patient has had problems with edema in his legs for years. He has not worn compression. He is type II diabetic that sounds poorly controlled.  He also has obstructive sleep apnea and cirrhosis. He is occasional cigar smoker 04/30/17; patient is a 67 year old man who has severe bilateral lower extremity lymphedema stage III since his early 42s he tells me. This is no doubt secondary to chronic venous insufficiency. He was going to the lymphedema clinic who forwarded him here due to skin breakdown in the right leg. He has not previously worn compression stockings. He has a large area of denuded epithelium on the posterior leg. He has a more open wound on the right lateral leg 05/07/17 on evaluation today patient appears to be doing about the same his wounds are showing signs of improvement and looking fairly well. With that being said he is not having any severe discomfort just some mild aching due to the swelling and really this is worse on the left than the right and the right side is where he actually has his wounds. He has been tolerating the [employer] wraps without complication and states this is actually much better than the wraps that he was getting at the lymphedema clinic most recently. He is happy with these wraps. He has no nausea, vomiting, diarrhea and no fevers or chills. We are still waiting to hear back in regard to the lymphedema pumps which I do think would be beneficial for him even with the compression wraps he still has a significant amount of edema noted of the bilateral lower extremities. 05/15/17 on evaluation today patient's right lower extremity edema and weeping especially the posterior portion appears to be doing better. He still is having some issue here but fortunately it's minimal compared to previous weeks. He continues to tolerate the compression fairly well. Overall he is pleased with how things are progressing.  No fevers, chills, nausea, or vomiting noted at this time. And patient is having only minimal discomfort. 05/22/17; the medial wound on the right leg actually looks considerably better and is  epithelialized. He still has a small punched out area on the right lateral leg. Edema control is marginal end of the [employer]. We are working on getting him compression pumps 05/29/17; the patient's lymphedema pumps are apparently arriving tonight. In the meantime he took his Unna boots off last Sunday because the edema had progressed to the point that a word to titrate. Since then he has put nothing on his right leg and more specifically nothing on the right medial and lateral wounds 06/05/17; the patient has his lymphedema pumps and he is using them once a day on the right leg. There is a sizable reduction in the circumference of his leg although he arrived in clinic today somewhat short of breath. 07/02/17; since the patient was last in clinic he was admitted on 2 occasions. He was admitted from 9/12 through 9/13 with Corey West, Corey A. ([id number]) lactic acidosis weakness and felt to have left lower extremity cellulitis. Due to the elevated lactic acid level he was felt to have sepsis although this was ruled out and the lactic acidosis was felt secondary to his chronic cirrhosis. He was initially treated with IV vancomycin for cellulitis and redness of his left leg but then this was subsequently recognized to be secondary to chronic venous stasis and chronic lymphedema. When he left the hospital he had Unna boots placed bilaterally. He was readmitted on 9/26 through 9/28 with GI bleeding. His EGD showed Barrett's esophagus and gastritis. He was discharged with a hemoglobin of 8 to follow-up with GI as an outpatient. There was nothing more about his lower extremities during this short hospital stay. The patient has not been using his external compression pumps fearing fluid overload and shortness of breath although he feels better now that his hemoglobin is stabilized. He continues to have epithelial loss on his bilateral lower extremities. 07/09/17; patient has a large but superficial area on the  right posterior leg and on the left anterior leg. He tells me he is been using his external compression pumps once a day for the required hour. He states the pumps hurt his right ankle. 07/17/17; patient was transfused a further 2 units since he was here the last time. His hemoglobin was once again found to be low. He has not been able to use his external compression pumps because of a combination of prolonged power outage in his home and I think issues with his other medical care. His wounds have actually worsened he now has loss of epithelialization all the way from the right lateral to the right medial posteriorly. The left side which has anterior loss of epithelialization also appears to be worse on the lateral aspect of the leg where there is denuded epithelium now as well. Leaking edema fluid. He has bilateral lymphedema. 08/12/2017 -- the patient has not been here for 25 days and has had the same compression wraps which have not been changed!! He says he was being worked up for a GI bleed and had some transfusions since he was seen last by Dr. Dellia Nims 08/20/17; mostly by review of what the patient is saying and a quick glance at his records in care everywhere I think this patient probably has Karlene Lineman, cirrhosis and portal hypertension. He is bleeding I think from gastric area and he is requiring recurrent transfusions. He  has been told that there is nothing more that can be done and that his prognosis is poor. He arrived in our office in our office in tears. We've been using silver alginate and 3 layer compression to his bilateral lower extremity wounds. He has significant lymphedema likely secondary to chronic venous insufficiency. He has external compression pumps at home that he had actually before he came to this clinic but he is not using 08/27/17; patient is doing well, wounds on his left leg are tiny one anteriorly and one laterally. He still has substantial wound on the posterior aspect  of his right calf extending laterally for over this looks better than last time. He tells me he is able to use his compression pumps once a day on most days. He has his hemoglobin checked on Friday 09-19-17 He arrives today for follow up evaluation. He was unable to come to the clinic last week r/t weather conditions, therefore compression has been in place for two weeks. He presents today with healed LLE and one partial thickness open area to the posterior RL. He states he is experiencing diarrhea secondary to antibiotic that will be stopped next week and has been unable to use lymphedema pumps. Will continue with compression bilaterally, follow up next week with plans to discharge and transition to lymphedema pumps 10/02/17; the patient arrives today with his juxta light stockings bilaterally. He also has external compression pumps at home. There are no open areas on his bilateral legs. He tells me he has had adjustments in his diuretics and had resultant hypokalemia but the amount of edema in his legs is a lot better although he stillhas changes of lymphedema 10/16/17; the patient has been using his juxta lites although they are not put on as high as we might like. He has very superficial excoriations at the top of his wraps but I don't think that anything to keep him in clinic here. Severe bilateral chronic venous insufficiency with secondary lymphedema. He has no open wound areas Electronic Signature(s) Signed: 10/16/2017 4:56:29 PM By: Linton Ham MD Entered By: Linton Ham on 10/16/2017 10:31:03 Corey West ([id number]) -------------------------------------------------------------------------------- Physical Exam Details Patient Name: Corey West, Corey A. Date of Service: 10/16/2017 9:15 AM Medical Record Number: [id number] Patient Account Number: [id number] Date of Birth/Sex: August 02, 1951 (67 y.o. Male) Treating RN: Cornell Barman Primary Care Provider: [name] Other  Clinician: Referring Provider: [name] Treating Provider/Extender: Tito Dine in Treatment: 25 Constitutional Sitting or standing Blood Pressure is within target range for patient.. Pulse regular and within target range for patient.Marland Kitchen Respirations regular, non-labored and within target range.. Temperature is normal and within the target range for the patient.Marland Kitchen appears in no distress. Notes When examined; the patient has no open area on either one of his legs. The weeping edema as created slight skin deterioration at the top of his wraps superiorly on the right however I think with adjustments of the juxta lites this should improve much of a difficulty. His edema control looks reasonably good. Electronic Signature(s) Signed: 10/16/2017 4:56:29 PM By: Linton Ham MD Entered By: Linton Ham on 10/16/2017 10:33:29 Corey West ([id number]) -------------------------------------------------------------------------------- Physician Orders Details Patient Name: Corey West, Corey A. Date of Service: 10/16/2017 9:15 AM Medical Record Number: [id number] Patient Account Number: [id number] Date of Birth/Sex: [birth year] (67 y.o. Male) Treating RN: Montey Hora Primary Care Provider: [name] Other Clinician: Referring Provider: [name] Treating Provider/Extender: Tito Dine in Treatment: 51 Verbal / Phone Orders: No Diagnosis Coding  Discharge From [facility] Services o Discharge from [place] Signature(s) Signed: 10/16/2017 4:56:29 PM By: Linton Ham MD Signed: 10/16/2017 5:10:27 PM By: Montey Hora Entered By: Montey Hora on 10/16/2017 10:25:24 Corey West ([id number]) -------------------------------------------------------------------------------- Problem List Details Patient Name: Corey West, FERNS. Date of Service: 10/16/2017 9:15 AM Medical Record Number: [id number] Patient Account Number: [id number] Date of Birth/Sex:  [birth year] (67 y.o. Male) Treating RN: Cornell Barman Primary Care Provider: [name] Other Clinician: Referring Provider: [name] Treating Provider/Extender: Tito Dine in Treatment: 25 Active Problems ICD-10 Encounter Code Description Active Date Diagnosis I89.0 Lymphedema, not elsewhere classified 04/23/2017 Yes L97.211 Non-pressure chronic ulcer of right calf limited to breakdown of skin 04/23/2017 Yes I87.331 Chronic venous hypertension (idiopathic) with ulcer and 04/23/2017 Yes inflammation of right lower extremity E11.622 Type 2 diabetes mellitus with other skin ulcer 04/23/2017 Yes I89.9 Noninfective disorder of lymphatic vessels and lymph nodes, 04/23/2017 Yes unspecified Inactive Problems Resolved Problems Electronic Signature(s) Signed: 10/16/2017 4:56:29 PM By: Linton Ham MD Entered By: Linton Ham on 10/16/2017 10:29:49 Corey West ([id number]) -------------------------------------------------------------------------------- Progress Note Details Patient Name: Corey West. Date of Service: 10/16/2017 9:15 AM Medical Record Number: [id number] Patient Account Number: [id number] Date of Birth/Sex: June 08, 1951 (67 y.o. Male) Treating RN: Cornell Barman Primary Care Provider: [name] Other Clinician: Referring Provider: [name] Treating Provider/Extender: Tito Dine in Treatment: 25 Subjective History of Present Illness (HPI) Pleasant 67 year old with a past medical history significant for morbid obesity, insulin-dependent type 2 diabetes (hemoglobin A1c 7.2 in January 2015), cirrhosis, and chronic kidney disease. He reports a left anterior calf ulcer since January 2015. He has had venous stasis ulcers in the past, which have healed spontaneously. No history of DVT. Does not regularly wear compression. Ambulating normally per his baseline. Recently completed a course of antibiotics. Treated with a Profore light and Coban 2  layer compression drainage, which both fell down and he cut off. Tolerated Coban lite compression bandage. Returns to clinic for followup. No complaints today. No pain at this time. No fever or chills. No open wounds at this time. He is having difficulty finding compression garments that fit him. 04/23/17; this is now 67 year old man who returns to our clinic for nonhealing wounds on the right lateral and right posterior calf. He tells me that they have been present for 6 months. In the setting of severe right greater than left lower extremity lymphedema likely secondary to chronic venous insufficiency. He has been followed in the lymphedema clinic at least since early June with compression wraps although these have not really helped and the patient is here for our review of this situation. The patient has had problems with edema in his legs for years. He has not worn compression. He is type II diabetic that sounds poorly controlled. He also has obstructive sleep apnea and cirrhosis. He is occasional cigar smoker 04/30/17; patient is a 67 year old man who has severe bilateral lower extremity lymphedema stage III since his early 10s he tells me. This is no doubt secondary to chronic venous insufficiency. He was going to the lymphedema clinic who forwarded him here due to skin breakdown in the right leg. He has not previously worn compression stockings. He has a large area of denuded epithelium on the posterior leg. He has a more open wound on the right lateral leg 05/07/17 on evaluation today patient appears to be doing about the same his wounds are showing signs of improvement and looking fairly well. With  that being said he is not having any severe discomfort just some mild aching due to the swelling and really this is worse on the left than the right and the right side is where he actually has his wounds. He has been tolerating the [employer] wraps without complication and states this is actually much  better than the wraps that he was getting at the lymphedema clinic most recently. He is happy with these wraps. He has no nausea, vomiting, diarrhea and no fevers or chills. We are still waiting to hear back in regard to the lymphedema pumps which I do think would be beneficial for him even with the compression wraps he still has a significant amount of edema noted of the bilateral lower extremities. 05/15/17 on evaluation today patient's right lower extremity edema and weeping especially the posterior portion appears to be doing better. He still is having some issue here but fortunately it's minimal compared to previous weeks. He continues to tolerate the compression fairly well. Overall he is pleased with how things are progressing. No fevers, chills, nausea, or vomiting noted at this time. And patient is having only minimal discomfort. 05/22/17; the medial wound on the right leg actually looks considerably better and is epithelialized. He still has a small punched out area on the right lateral leg. Edema control is marginal end of the [employer]. We are working on getting him compression pumps 05/29/17; the patient's lymphedema pumps are apparently arriving tonight. In the meantime he took his Unna boots off last Sunday because the edema had progressed to the point that a word to titrate. Since then he has put nothing on his right leg and more specifically nothing on the right medial and lateral wounds 06/05/17; the patient has his lymphedema pumps and he is using them once a day on the right leg. There is a sizable reduction in the circumference of his leg although he arrived in clinic today somewhat short of breath. 07/02/17; since the patient was last in clinic he was admitted on 2 occasions. He was admitted from 9/12 through 9/13 with lactic acidosis weakness and felt to have left lower extremity cellulitis. Due to the elevated lactic acid level he was felt to have sepsis although this was ruled  out and the lactic acidosis was felt secondary to his chronic cirrhosis. He was initially treated Corey West, Corey West ([id number]) with IV vancomycin for cellulitis and redness of his left leg but then this was subsequently recognized to be secondary to chronic venous stasis and chronic lymphedema. When he left the hospital he had Unna boots placed bilaterally. He was readmitted on 9/26 through 9/28 with GI bleeding. His EGD showed Barrett's esophagus and gastritis. He was discharged with a hemoglobin of 8 to follow-up with GI as an outpatient. There was nothing more about his lower extremities during this short hospital stay. The patient has not been using his external compression pumps fearing fluid overload and shortness of breath although he feels better now that his hemoglobin is stabilized. He continues to have epithelial loss on his bilateral lower extremities. 07/09/17; patient has a large but superficial area on the right posterior leg and on the left anterior leg. He tells me he is been using his external compression pumps once a day for the required hour. He states the pumps hurt his right ankle. 07/17/17; patient was transfused a further 2 units since he was here the last time. His hemoglobin was once again found to be low. He has  not been able to use his external compression pumps because of a combination of prolonged power outage in his home and I think issues with his other medical care. His wounds have actually worsened he now has loss of epithelialization all the way from the right lateral to the right medial posteriorly. The left side which has anterior loss of epithelialization also appears to be worse on the lateral aspect of the leg where there is denuded epithelium now as well. Leaking edema fluid. He has bilateral lymphedema. 08/12/2017 -- the patient has not been here for 25 days and has had the same compression wraps which have not been changed!! He says he was being worked up  for a GI bleed and had some transfusions since he was seen last by Dr. Dellia Nims 08/20/17; mostly by review of what the patient is saying and a quick glance at his records in care everywhere I think this patient probably has Karlene Lineman, cirrhosis and portal hypertension. He is bleeding I think from gastric area and he is requiring recurrent transfusions. He has been told that there is nothing more that can be done and that his prognosis is poor. He arrived in our office in our office in tears. We've been using silver alginate and 3 layer compression to his bilateral lower extremity wounds. He has significant lymphedema likely secondary to chronic venous insufficiency. He has external compression pumps at home that he had actually before he came to this clinic but he is not using 08/27/17; patient is doing well, wounds on his left leg are tiny one anteriorly and one laterally. He still has substantial wound on the posterior aspect of his right calf extending laterally for over this looks better than last time. He tells me he is able to use his compression pumps once a day on most days. He has his hemoglobin checked on Friday 09-19-17 He arrives today for follow up evaluation. He was unable to come to the clinic last week r/t weather conditions, therefore compression has been in place for two weeks. He presents today with healed LLE and one partial thickness open area to the posterior RL. He states he is experiencing diarrhea secondary to antibiotic that will be stopped next week and has been unable to use lymphedema pumps. Will continue with compression bilaterally, follow up next week with plans to discharge and transition to lymphedema pumps 10/02/17; the patient arrives today with his juxta light stockings bilaterally. He also has external compression pumps at home. There are no open areas on his bilateral legs. He tells me he has had adjustments in his diuretics and had resultant hypokalemia but the amount  of edema in his legs is a lot better although he stillhas changes of lymphedema 10/16/17; the patient has been using his juxta lites although they are not put on as high as we might like. He has very superficial excoriations at the top of his wraps but I don't think that anything to keep him in clinic here. Severe bilateral chronic venous insufficiency with secondary lymphedema. He has no open wound areas Objective Constitutional Sitting or standing Blood Pressure is within target range for patient.. Pulse regular and within target range for patient.Marland Kitchen Respirations regular, non-labored and within target range.. Temperature is normal and within the target range for the patient.Marland Kitchen appears in no distress. Vitals Time Taken: 9:33 AM, Height: 69 in, Weight: 305.4 lbs, BMI: 45.1, Temperature: 97.7 F, Pulse: 79 bpm, Respiratory Rate: 18 breaths/min, Blood Pressure: 140/48 mmHg. Corey West, Corey West ([id number])  General Notes: When examined; the patient has no open area on either one of his legs. The weeping edema as created slight skin deterioration at the top of his wraps superiorly on the right however I think with adjustments of the juxta lites this should improve much of a difficulty. His edema control looks reasonably good. Assessment Active Problems ICD-10 I89.0 - Lymphedema, not elsewhere classified L97.211 - Non-pressure chronic ulcer of right calf limited to breakdown of skin I87.331 - Chronic venous hypertension (idiopathic) with ulcer and inflammation of right lower extremity E11.622 - Type 2 diabetes mellitus with other skin ulcer I89.9 - Noninfective disorder of lymphatic vessels and lymph nodes, unspecified Plan Discharge From [facility] Services: Discharge from [place] #1 the patient has no significant open wounds and I think can be discharged to his own juxta lites stockings. #2 I've cautioned skin lubrication #3 he has severe chronic venous insufficiency with secondary  lymphedema. #4 recently admitted to hospital he says for extreme hypokalemia. I have not looked through this #5 and imaging of a discharge from her clinic at this point although the risk of reopening here is high Electronic Signature(s) Signed: 10/16/2017 4:56:29 PM By: Linton Ham MD Entered By: Linton Ham on 10/16/2017 10:35:36 Corey West ([id number]) -------------------------------------------------------------------------------- Chical Details Patient Name: Corey Cower A. Date of Service: 10/16/2017 Medical Record Number: [id number] Patient Account Number: [id number] Date of Birth/Sex: 14-Aug-1951 (67 y.o. 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 diabetes mellitus with other skin ulcer I89.9 Noninfective disorder of lymphatic vessels and lymph nodes, unspecified Facility Procedures CPT4 Code: [id number] Description: [phone] - WOUND CARE VISIT-LEV 2 EST PT Modifier: Quantity: 1 Physician Procedures CPT4: Description Modifier Quantity Code [id number] - WC PHYS LEVEL 2 - EST PT 1 ICD-10 Diagnosis Description L97.211 Non-pressure chronic ulcer of right calf limited to breakdown of skin I89.0 Lymphedema, not elsewhere classified I87.331 Chronic  venous hypertension (idiopathic) with ulcer and inflammation of right lower extremity Electronic Signature(s) Signed: 10/16/2017 4:56:29 PM By: Linton Ham MD Signed: 10/16/2017 5:10:27 PM By: Montey Hora Entered By: Montey Hora on 10/16/2017 10:43:31

## 2017-10-17 NOTE — ED Notes (Signed)
Patient transported to CT 

## 2017-10-17 NOTE — Progress Notes (Signed)
EULAN, HEYWARD ([id number]) Visit Report for 10/16/2017 Arrival Information Details Patient Name: Corey West, Corey West. Date of Service: 10/16/2017 9:15 AM Medical Record Number: [id number] Patient Account Number: [id number] Date of Birth/Sex: 1951/07/17 (66 y.o. Male) Treating RN: Cornell Barman Primary Care Donica Derouin: [name] Other Clinician: Referring Miguelina Fore: [name] Treating Allex Lapoint/Extender: Tito Dine in Treatment: 25 Visit Information History Since Last Visit Added or deleted any medications: No Patient Arrived: Kasandra Knudsen Any new allergies or adverse reactions: No Arrival Time: 09:31 Had a fall or experienced change in No Accompanied By: self activities of daily living that may affect Transfer Assistance: None risk of falls: Patient Identification Verified: Yes Signs or symptoms of abuse/neglect since last visito No Secondary Verification Process Completed: Yes Hospitalized since last visit: No Patient Requires Transmission-Based Precautions: No Has Compression in Place as Prescribed: Yes Patient Has Alerts: No Pain Present Now: No Electronic Signature(s) Signed: 10/16/2017 4:27:21 PM By: Gretta Cool, BSN, RN, CWS, Kim RN, BSN Entered By: Gretta Cool, BSN, RN, CWS, Kim on 10/16/2017 09:31:36 [name] ([id number]) -------------------------------------------------------------------------------- Clinic Level of Care Assessment Details Patient Name: MARKON, JARES A. Date of Service: 10/16/2017 9:15 AM Medical Record Number: [id number] Patient Account Number: [id number] Date of Birth/Sex: June 19, 1951 (67 y.o. Male) Treating RN: Montey Hora Primary Care Sudiksha Victor: [name] Other Clinician: Referring Savvas Roper: [name] Treating Trenden Hazelrigg/Extender: Tito Dine in Treatment: 25 Clinic Level of Care Assessment Items TOOL 4 Quantity Score []  - Use when only an EandM is performed on FOLLOW-UP visit 0 ASSESSMENTS - Nursing Assessment / Reassessment X -  Reassessment of Co-morbidities (includes updates in patient status) 1 10 X- 1 5 Reassessment of Adherence to Treatment Plan ASSESSMENTS - Wound and Skin Assessment / Reassessment []  - Simple Wound Assessment / Reassessment - one wound 0 []  - 0 Complex Wound Assessment / Reassessment - multiple wounds X- 1 10 Dermatologic / Skin Assessment (not related to wound area) ASSESSMENTS - Focused Assessment []  - Circumferential Edema Measurements - multi extremities 0 []  - 0 Nutritional Assessment / Counseling / Intervention X- 1 5 Lower Extremity Assessment (monofilament, tuning fork, pulses) []  - 0 Peripheral Arterial Disease Assessment (using hand held doppler) ASSESSMENTS - Ostomy and/or Continence Assessment and Care []  - Incontinence Assessment and Management 0 []  - 0 Ostomy Care Assessment and Management (repouching, etc.) PROCESS - Coordination of Care X - Simple Patient / Family Education for ongoing care 1 15 []  - 0 Complex (extensive) Patient / Family Education for ongoing care []  - 0 Staff obtains Programmer, systems, Records, Test Results / Process Orders []  - 0 Staff telephones HHA, Nursing Homes / Clarify orders / etc []  - 0 Routine Transfer to another Facility (non-emergent condition) []  - 0 Routine Hospital Admission (non-emergent condition) []  - 0 New Admissions / Biomedical engineer / Ordering NPWT, Apligraf, etc. []  - 0 Emergency Hospital Admission (emergent condition) X- 1 10 Simple Discharge Coordination HYATT, CAPOBIANCO ([id number]) []  - 0 Complex (extensive) Discharge Coordination PROCESS - Special Needs []  - Pediatric / Minor Patient Management 0 []  - 0 Isolation Patient Management []  - 0 Hearing / Language / Visual special needs []  - 0 Assessment of Community assistance (transportation, D/C planning, etc.) []  - 0 Additional assistance / Altered mentation []  - 0 Support Surface(s) Assessment (bed, cushion, seat, etc.) INTERVENTIONS - Wound Cleansing /  Measurement []  - Simple Wound Cleansing - one wound 0 []  - 0 Complex Wound Cleansing - multiple wounds []  - 0 Wound Imaging (photographs - any number  of wounds) []  - 0 Wound Tracing (instead of photographs) []  - 0 Simple Wound Measurement - one wound []  - 0 Complex Wound Measurement - multiple wounds INTERVENTIONS - Wound Dressings []  - Small Wound Dressing one or multiple wounds 0 []  - 0 Medium Wound Dressing one or multiple wounds []  - 0 Large Wound Dressing one or multiple wounds []  - 0 Application of Medications - topical []  - 0 Application of Medications - injection INTERVENTIONS - Miscellaneous []  - External ear exam 0 []  - 0 Specimen Collection (cultures, biopsies, blood, body fluids, etc.) []  - 0 Specimen(s) / Culture(s) sent or taken to Lab for analysis []  - 0 Patient Transfer (multiple staff / Civil Service fast streamer / Similar devices) []  - 0 Simple Staple / Suture removal (25 or less) []  - 0 Complex Staple / Suture removal (26 or more) []  - 0 Hypo / Hyperglycemic Management (close monitor of Blood Glucose) []  - 0 Ankle / Brachial Index (ABI) - do not check if billed separately X- 1 5 Vital Signs JASMOND, RIVER ([id number]) Has the patient been seen at the hospital within the last three years: Yes Total Score: 60 Level Of Care: New/Established - Level 2 Electronic Signature(s) Signed: 10/16/2017 5:10:27 PM By: Montey Hora Entered By: Montey Hora on 10/16/2017 10:43:23 [name] ([id number]) -------------------------------------------------------------------------------- Encounter Discharge Information Details Patient Name: [name] Date of Service: 10/16/2017 9:15 AM Medical Record Number: [id number] Patient Account Number: [id number] Date of Birth/Sex: 07-26-1951 (67 y.o. Male) Treating RN: Cornell Barman Primary Care Joyceann Kruser: [name] Other Clinician: Referring Davell Beckstead: [name] Treating Hezikiah Retzloff/Extender: Tito Dine in  Treatment: 25 Encounter Discharge Information Items Discharge Pain Level: 0 Discharge Condition: Stable Ambulatory Status: Cane Discharge Destination: Home Transportation: Private Auto Accompanied By: self Schedule Follow-up Appointment: No Medication Reconciliation completed and No provided to Patient/Care Shaniquia Brafford: Patient Clinical Summary of Care: Declined Electronic Signature(s) Signed: 10/16/2017 5:10:27 PM By: Montey Hora Entered By: Montey Hora on 10/16/2017 10:43:55 [name] ([id number]) -------------------------------------------------------------------------------- Lower Extremity Assessment Details Patient Name: SHRIHAAN, PORZIO A. Date of Service: 10/16/2017 9:15 AM Medical Record Number: [id number] Patient Account Number: [id number] Date of Birth/Sex: 16-Sep-1951 (67 y.o. Male) Treating RN: Montey Hora Primary Care Shallen Luedke: [name] Other Clinician: Referring Terrell Shimko: [name] Treating Lataria Courser/Extender: Tito Dine in Treatment: 25 Edema Assessment Assessed: [Left: No] [Right: No] [Left: Edema] [Right: :] Calf Left: Right: Point of Measurement: 32 cm From Medial Instep cm cm Ankle Left: Right: Point of Measurement: 13 cm From Medial Instep cm cm Vascular Assessment Pulses: Dorsalis Pedis Palpable: [Left:Yes] [Right:Yes] Posterior Tibial Extremity colors, hair growth, and conditions: Extremity Color: [Left:Hyperpigmented] [Right:Hyperpigmented] Hair Growth on Extremity: [Left:No] [Right:No] Temperature of Extremity: [Left:Warm] [Right:Warm] Capillary Refill: [Left:< 3 seconds] [Right:< 3 seconds] Electronic Signature(s) Signed: 10/16/2017 5:10:27 PM By: Montey Hora Entered By: Montey Hora on 10/16/2017 10:25:10 [name] ([id number]) -------------------------------------------------------------------------------- Multi Wound Chart Details Patient Name: [name] Date of Service: 10/16/2017 9:15 AM Medical  Record Number: [id number] Patient Account Number: [id number] Date of Birth/Sex: May 24, 1951 (67 y.o. Male) Treating RN: Roger Shelter Primary Care Sunya Humbarger: [name] Other Clinician: Referring Kineta Fudala: [name] Treating Floraine Buechler/Extender: Tito Dine in Treatment: 25 Vital Signs Height(in): 69 Pulse(bpm): 79 Weight(lbs): 305.4 Blood Pressure(mmHg): 140/48 Body Mass Index(BMI): 45 Temperature(F): 97.7 Respiratory Rate 18 (breaths/min): Wound Assessments Treatment Notes Electronic Signature(s) Signed: 10/16/2017 4:54:54 PM By: Roger Shelter Entered By: Roger Shelter on 10/16/2017 16:54:54 [name] ([id number]) -------------------------------------------------------------------------------- [place]  Details Patient Name: YON, SCHIFFMAN. Date of Service: 10/16/2017 9:15 AM Medical Record Number: [id number] Patient Account Number: [id number] Date of Birth/Sex: [birth year] (67 y.o. Male) Treating RN: Montey Hora Primary Care Jasdeep Dejarnett: [name] Other Clinician: Referring Wilbert Schouten: [name] Treating Quention Mcneill/Extender: Tito Dine in Treatment: 25 Active Inactive Electronic Signature(s) Signed: 10/16/2017 5:10:27 PM By: Montey Hora Entered By: Montey Hora on 10/16/2017 10:42:53 [name] ([id number]) -------------------------------------------------------------------------------- Pain Assessment Details Patient Name: [name] Date of Service: 10/16/2017 9:15 AM Medical Record Number: [id number] Patient Account Number: [id number] Date of Birth/Sex: 1951-02-16 (67 y.o. Male) Treating RN: Cornell Barman Primary Care Odelle Kosier: [name] Other Clinician: Referring Adarrius Graeff: [name] Treating Denisia Harpole/Extender: Tito Dine in Treatment: 25 Active Problems Location of Pain Severity and Description of Pain Patient Has Paino No Site Locations With Dressing Change: No Pain  Management and Medication Current Pain Management: Electronic Signature(s) Signed: 10/16/2017 4:27:21 PM By: Gretta Cool, BSN, RN, CWS, Kim RN, BSN Entered By: Gretta Cool, BSN, RN, CWS, Kim on 10/16/2017 09:33:13 [name] ([id number]) -------------------------------------------------------------------------------- Patient/Caregiver Education Details Patient Name: VICK, FILTER A. Date of Service: 10/16/2017 9:15 AM Medical Record Number: [id number] Patient Account Number: [id number] Date of Birth/Gender: 11-23-1950 (67 y.o. Male) Treating RN: Montey Hora Primary Care Physician: [name] Other Clinician: Referring Physician: [name] Treating Physician/Extender: Tito Dine in Treatment: 25 Education Assessment Education Provided To: Patient Education Topics Provided Venous: Handouts: Other: proper use of juxtalite Methods: Demonstration, Explain/Verbal Responses: State content correctly Electronic Signature(s) Signed: 10/16/2017 5:10:27 PM By: Montey Hora Entered By: Montey Hora on 10/16/2017 10:44:13 [name] ([id number]) -------------------------------------------------------------------------------- [place] Details Patient Name: [name] Date of Service: 10/16/2017 9:15 AM Medical Record Number: [id number] Patient Account Number: [id number] Date of Birth/Sex: 09/22/1951 (67 y.o. Male) Treating RN: Cornell Barman Primary Care Horatio Bertz: [name] Other Clinician: Referring Armanii Urbanik: [name] Treating Jenavie Stanczak/Extender: Tito Dine in Treatment: 25 Vital Signs Time Taken: 09:33 Temperature (F): 97.7 Height (in): 69 Pulse (bpm): 79 Weight (lbs): 305.4 Respiratory Rate (breaths/min): 18 Body Mass Index (BMI): 45.1 Blood Pressure (mmHg): 140/48 Reference Range: 80 - 120 mg / dl Electronic Signature(s) Signed: 10/16/2017 4:27:21 PM By: Gretta Cool, BSN, RN, CWS, Kim RN, BSN Entered By: Gretta Cool, BSN, RN, CWS, Kim on 10/16/2017  09:33:52

## 2017-10-17 NOTE — ED Notes (Signed)
Family stepped out for dinner. Will return to bedside. Primary RN aware

## 2017-10-18 ENCOUNTER — Encounter: Payer: Self-pay | Admitting: Internal Medicine

## 2017-10-18 ENCOUNTER — Other Ambulatory Visit: Payer: Self-pay

## 2017-10-18 DIAGNOSIS — K219 Gastro-esophageal reflux disease without esophagitis: Secondary | ICD-10-CM | POA: Diagnosis present

## 2017-10-18 DIAGNOSIS — K2270 Barrett's esophagus without dysplasia: Secondary | ICD-10-CM | POA: Diagnosis present

## 2017-10-18 DIAGNOSIS — F419 Anxiety disorder, unspecified: Secondary | ICD-10-CM | POA: Diagnosis present

## 2017-10-18 DIAGNOSIS — E1121 Type 2 diabetes mellitus with diabetic nephropathy: Secondary | ICD-10-CM | POA: Diagnosis present

## 2017-10-18 DIAGNOSIS — G4730 Sleep apnea, unspecified: Secondary | ICD-10-CM | POA: Diagnosis present

## 2017-10-18 DIAGNOSIS — F1021 Alcohol dependence, in remission: Secondary | ICD-10-CM | POA: Diagnosis present

## 2017-10-18 DIAGNOSIS — N183 Chronic kidney disease, stage 3 (moderate): Secondary | ICD-10-CM | POA: Diagnosis present

## 2017-10-18 DIAGNOSIS — E876 Hypokalemia: Secondary | ICD-10-CM | POA: Diagnosis not present

## 2017-10-18 DIAGNOSIS — K7040 Alcoholic hepatic failure without coma: Secondary | ICD-10-CM | POA: Diagnosis present

## 2017-10-18 DIAGNOSIS — K7682 Hepatic encephalopathy: Secondary | ICD-10-CM | POA: Diagnosis present

## 2017-10-18 DIAGNOSIS — I872 Venous insufficiency (chronic) (peripheral): Secondary | ICD-10-CM | POA: Diagnosis present

## 2017-10-18 DIAGNOSIS — Z6841 Body Mass Index (BMI) 40.0 and over, adult: Secondary | ICD-10-CM | POA: Diagnosis not present

## 2017-10-18 DIAGNOSIS — E1122 Type 2 diabetes mellitus with diabetic chronic kidney disease: Secondary | ICD-10-CM | POA: Diagnosis present

## 2017-10-18 DIAGNOSIS — I129 Hypertensive chronic kidney disease with stage 1 through stage 4 chronic kidney disease, or unspecified chronic kidney disease: Secondary | ICD-10-CM | POA: Diagnosis present

## 2017-10-18 DIAGNOSIS — I8500 Esophageal varices without bleeding: Secondary | ICD-10-CM | POA: Diagnosis present

## 2017-10-18 DIAGNOSIS — K7030 Alcoholic cirrhosis of liver without ascites: Secondary | ICD-10-CM | POA: Diagnosis present

## 2017-10-18 DIAGNOSIS — K449 Diaphragmatic hernia without obstruction or gangrene: Secondary | ICD-10-CM | POA: Diagnosis present

## 2017-10-18 DIAGNOSIS — D638 Anemia in other chronic diseases classified elsewhere: Secondary | ICD-10-CM | POA: Diagnosis present

## 2017-10-18 DIAGNOSIS — Z794 Long term (current) use of insulin: Secondary | ICD-10-CM | POA: Diagnosis not present

## 2017-10-18 DIAGNOSIS — D631 Anemia in chronic kidney disease: Secondary | ICD-10-CM | POA: Diagnosis present

## 2017-10-18 DIAGNOSIS — F17290 Nicotine dependence, other tobacco product, uncomplicated: Secondary | ICD-10-CM | POA: Diagnosis present

## 2017-10-18 DIAGNOSIS — E785 Hyperlipidemia, unspecified: Secondary | ICD-10-CM | POA: Diagnosis present

## 2017-10-18 DIAGNOSIS — K7290 Hepatic failure, unspecified without coma: Secondary | ICD-10-CM | POA: Diagnosis present

## 2017-10-18 DIAGNOSIS — R4182 Altered mental status, unspecified: Secondary | ICD-10-CM | POA: Diagnosis present

## 2017-10-18 LAB — CBC
HCT: 22.4 % — ABNORMAL LOW (ref 40.0–52.0)
Hemoglobin: 7.5 g/dL — ABNORMAL LOW (ref 13.0–18.0)
MCH: 31.3 pg (ref 26.0–34.0)
MCHC: 33.3 g/dL (ref 32.0–36.0)
MCV: 94 fL (ref 80.0–100.0)
RBC: 2.38 MIL/uL — AB (ref 4.40–5.90)
RDW: 16.4 % — AB (ref 11.5–14.5)
WBC: 4.8 10*3/uL (ref 3.8–10.6)

## 2017-10-18 LAB — BASIC METABOLIC PANEL
Anion gap: 8 (ref 5–15)
BUN: 27 mg/dL — AB (ref 6–20)
CHLORIDE: 112 mmol/L — AB (ref 101–111)
CO2: 22 mmol/L (ref 22–32)
CREATININE: 1.75 mg/dL — AB (ref 0.61–1.24)
Calcium: 8.6 mg/dL — ABNORMAL LOW (ref 8.9–10.3)
GFR calc Af Amer: 45 mL/min — ABNORMAL LOW (ref >60)
GFR calc non Af Amer: 39 mL/min — ABNORMAL LOW (ref >60)
GLUCOSE: 60 mg/dL — AB (ref 65–99)
Potassium: 3.5 mmol/L (ref 3.5–5.1)
Sodium: 142 mmol/L (ref 135–145)

## 2017-10-18 LAB — URINALYSIS, COMPLETE (UACMP) WITH MICROSCOPIC
BACTERIA UA: NONE SEEN
Bilirubin Urine: NEGATIVE
GLUCOSE, UA: NEGATIVE mg/dL
Hgb urine dipstick: NEGATIVE
KETONES UR: NEGATIVE mg/dL
Leukocytes, UA: NEGATIVE
NITRITE: NEGATIVE
PROTEIN: NEGATIVE mg/dL
RBC / HPF: NONE SEEN RBC/hpf (ref 0–5)
Specific Gravity, Urine: 1.009 (ref 1.005–1.030)
pH: 5 (ref 5.0–8.0)

## 2017-10-18 LAB — GLUCOSE, CAPILLARY
GLUCOSE-CAPILLARY: 138 mg/dL — AB (ref 65–99)
GLUCOSE-CAPILLARY: 252 mg/dL — AB (ref 65–99)
GLUCOSE-CAPILLARY: 211 mg/dL — AB (ref 65–99)
Glucose-Capillary: 51 mg/dL — ABNORMAL LOW (ref 65–99)
Glucose-Capillary: 92 mg/dL (ref 65–99)
Glucose-Capillary: 207 mg/dL — ABNORMAL HIGH (ref 65–99)

## 2017-10-18 LAB — AMMONIA: Ammonia: 44 umol/L — ABNORMAL HIGH (ref 9–35)

## 2017-10-18 LAB — LACTIC ACID, PLASMA: Lactic Acid, Venous: 1.1 mmol/L (ref 0.5–1.9)

## 2017-10-18 MED ORDER — DEXTROSE 50 % IV SOLN
INTRAVENOUS | Status: AC
  Administered 2017-10-18: 06:00:00 25 mL via INTRAVENOUS
  Filled 2017-10-18: qty 50

## 2017-10-18 MED ORDER — INSULIN ASPART 100 UNIT/ML ~~LOC~~ SOLN
0.0000 [IU] | Freq: Three times a day (TID) | SUBCUTANEOUS | Status: DC
  Administered 2017-10-18: 17:00:00 5 [IU] via SUBCUTANEOUS
  Administered 2017-10-19 (×2): 2 [IU] via SUBCUTANEOUS
  Filled 2017-10-18 (×3): qty 1

## 2017-10-18 MED ORDER — POTASSIUM CHLORIDE CRYS ER 20 MEQ PO TBCR
20.0000 meq | EXTENDED_RELEASE_TABLET | Freq: Every day | ORAL | Status: DC
  Administered 2017-10-18 – 2017-10-19 (×2): 20 meq via ORAL
  Filled 2017-10-18 (×2): qty 1

## 2017-10-18 MED ORDER — DEXTROSE 50 % IV SOLN
25.0000 mL | Freq: Once | INTRAVENOUS | Status: AC
  Administered 2017-10-18: 25 mL via INTRAVENOUS

## 2017-10-18 MED ORDER — SODIUM CHLORIDE 0.9% FLUSH
3.0000 mL | Freq: Two times a day (BID) | INTRAVENOUS | Status: DC
  Administered 2017-10-18 – 2017-10-19 (×4): 3 mL via INTRAVENOUS

## 2017-10-18 MED ORDER — INSULIN ASPART 100 UNIT/ML ~~LOC~~ SOLN
0.0000 [IU] | Freq: Three times a day (TID) | SUBCUTANEOUS | Status: DC
  Administered 2017-10-18: 3 [IU] via SUBCUTANEOUS

## 2017-10-18 MED ORDER — LACTULOSE 10 GM/15ML PO SOLN
30.0000 g | Freq: Four times a day (QID) | ORAL | Status: DC
  Administered 2017-10-18 (×5): 30 g via ORAL
  Filled 2017-10-18 (×7): qty 60

## 2017-10-18 MED ORDER — SODIUM CHLORIDE 0.9 % IV SOLN: 250.0000 mL | INTRAVENOUS | Status: DC | PRN

## 2017-10-18 MED ORDER — INSULIN DETEMIR 100 UNIT/ML ~~LOC~~ SOLN
15.0000 [IU] | Freq: Two times a day (BID) | SUBCUTANEOUS | Status: DC
  Administered 2017-10-18 – 2017-10-19 (×2): 15 [IU] via SUBCUTANEOUS
  Filled 2017-10-18 (×3): qty 0.15

## 2017-10-18 MED ORDER — SODIUM CHLORIDE 0.9% FLUSH: 3.0000 mL | INTRAVENOUS | Status: DC | PRN

## 2017-10-18 MED ORDER — HEPARIN SODIUM (PORCINE) 5000 UNIT/ML IJ SOLN
5000.0000 [IU] | Freq: Three times a day (TID) | INTRAMUSCULAR | Status: DC
  Administered 2017-10-18 – 2017-10-19 (×3): 5000 [IU] via SUBCUTANEOUS
  Filled 2017-10-18 (×3): qty 1

## 2017-10-18 MED ORDER — SPIRONOLACTONE 25 MG PO TABS
25.0000 mg | ORAL_TABLET | Freq: Every day | ORAL | Status: DC
  Administered 2017-10-18 – 2017-10-19 (×2): 25 mg via ORAL
  Filled 2017-10-18 (×2): qty 1

## 2017-10-18 MED ORDER — INSULIN ASPART 100 UNIT/ML ~~LOC~~ SOLN
0.0000 [IU] | Freq: Every day | SUBCUTANEOUS | Status: DC
  Administered 2017-10-18: 21:00:00 2 [IU] via SUBCUTANEOUS
  Filled 2017-10-18: qty 1

## 2017-10-18 MED ORDER — FUROSEMIDE 10 MG/ML IJ SOLN
40.0000 mg | Freq: Two times a day (BID) | INTRAMUSCULAR | Status: DC
  Administered 2017-10-18 – 2017-10-19 (×4): 40 mg via INTRAVENOUS
  Filled 2017-10-18 (×4): qty 4

## 2017-10-18 MED ORDER — GERHARDT'S BUTT CREAM
1.0000 "application " | TOPICAL_CREAM | Freq: Two times a day (BID) | CUTANEOUS | Status: DC
  Administered 2017-10-18 – 2017-10-19 (×4): 1 via TOPICAL
  Filled 2017-10-18: qty 1

## 2017-10-18 MED ORDER — RIFAXIMIN 550 MG PO TABS
550.0000 mg | ORAL_TABLET | Freq: Two times a day (BID) | ORAL | Status: DC
  Administered 2017-10-18 – 2017-10-19 (×3): 550 mg via ORAL
  Filled 2017-10-18 (×3): qty 1

## 2017-10-18 MED ORDER — INSULIN ASPART 100 UNIT/ML ~~LOC~~ SOLN
2.0000 [IU] | Freq: Three times a day (TID) | SUBCUTANEOUS | Status: DC
  Administered 2017-10-18 – 2017-10-19 (×3): 2 [IU] via SUBCUTANEOUS
  Filled 2017-10-18 (×3): qty 1

## 2017-10-18 MED ORDER — ONDANSETRON HCL 4 MG/2ML IJ SOLN: 4.0000 mg | Freq: Four times a day (QID) | INTRAMUSCULAR | Status: DC | PRN

## 2017-10-18 MED ORDER — ONDANSETRON HCL 4 MG PO TABS: 4.0000 mg | ORAL_TABLET | Freq: Four times a day (QID) | ORAL | Status: DC | PRN

## 2017-10-18 NOTE — Consult Note (Signed)
[place] Nurse wound consult note Reason for Consult:Patient with chronic venous insufficiency and lymphedema.  Wears Juxtalite compression but cannot don and doff himself and lives alone and has suboptimal compliance.  Blistering present to right anterior lower leg.  Wound type:venous insufficiency Pressure Injury POA: Yes/ Measurement: scattered (3) 0.2 cm serum filled blisters to right lower leg with generalized edema to both legs Wound TXH:FSFSE filled blisters Drainage (amount, consistency, odor)  Minimal serous weeping Periwound:edema erythema Dressing procedure/placement/frequency: CLeanse bilateral legs with soap and water.  Moisturize with barrier cream.  Apply removable compression.  Refuses Unna boots.  Will not follow at this time.  Please re-consult if needed.  Domenic Moras RN BSN [place] Pager [phone]

## 2017-10-18 NOTE — Significant Event (Signed)
Rapid Response Event Note  Overview: Time Called: 0615 Arrival Time: 0617 Event Type: Other (Comment)  Initial Focused Assessment: Pt in bed, shivering. RN states temp 93.6 rectally and blood sugar 61. Pt has been NPO and getting lactulose.   Interventions:Given 1/2 amp D50 prior to arrival of team. Warm blankets in place  Plan of Care (if not transferred):Follow up blood sugar 92. Bair Hugger placed. Monitor In room.   Event Summary:   at      at    Outcome: Stayed in room and stabalized     Corey West A

## 2017-10-18 NOTE — Progress Notes (Signed)
Noticed pt lab with glucose of 60.  Repeat finger stick with glucose of 51.  Gave 1/2 amp D50.  BP wnl but temp 93.6 rectal.  Called rapid, placed warm blankets on pt.  Bear hugger started on medium.  Repet vitals 142/74, Ma 91, HR 66, SPO2 100% room air.  Repeat glucose is 92.  Pt with rigors, talking, alert. Paged [facility] and awaiting return call. Discussed with Dr Estanislado Pandy .  New order for regular diet. Dorna Bloom RN

## 2017-10-18 NOTE — H&P (Addendum)
[place] [place] NAME: Corey West    MR#:  [id number]  DATE OF BIRTH:  1951-03-03  DATE OF ADMISSION:  10/17/2017  PRIMARY CARE PHYSICIAN: Baxter Hire, MD   REQUESTING/REFERRING PHYSICIAN:   CHIEF COMPLAINT:   Chief Complaint  Patient presents with  . Altered Mental Status    HISTORY OF PRESENT ILLNESS: Corey West  is a 67 y.o. male with a known history of cirrhosis of liver, chronic kidney disease, arthritis, arteriovenous malformations of the stomach, Barrett's esophagus presented to the emergency room with confusion and somnolence.  Patient lives alone and his niece lives nearby and checks on him.  Patient was found to be confused for the last couple of days according to his family member.  He has been somnolent and has not been taking medications correctly.  Ammonia level is elevated during the workup in the emergency room.  He is not oriented to time place and person.  Patient was given oral lactulose in the emergency room.  Hospitalist service because consulted.  No history of any fall or head injury.  He was worked up with CT head which showed no acute abnormality.  PAST MEDICAL HISTORY:   Past Medical History:  Diagnosis Date  . Anemia   . Anxiety    controlled;   . Arthritis   . AVM (arteriovenous malformation) of stomach, acquired with hemorrhage   . Barrett's esophagus   . Chronic kidney disease    renal infufficiency  . Cirrhosis ([place])   . Depression    controlled;   Marland Kitchen Diabetes mellitus without complication ([place])    not controlled, taking insulin but sugar continues to run high;   . Edema   . Esophageal varices ([place])   . GERD (gastroesophageal reflux disease)   . History of hiatal hernia   . Hyperlipidemia   . Hypertension    controlled well;   . Nephropathy, diabetic ([place])   . Obesity   . Pancytopenia ([place])   . Polyp, stomach    with chronic blood loss  . Sleep apnea    does not wear a cpap,  Medicare would not pay for it   . Venous stasis dermatitis of both lower extremities   . Venous stasis of both lower extremities    with cellulitis    PAST SURGICAL HISTORY:  Past Surgical History:  Procedure Laterality Date  . ESOPHAGOGASTRODUODENOSCOPY N/A 02/09/2015   Procedure: ESOPHAGOGASTRODUODENOSCOPY (EGD);  Surgeon: Manya Silvas, MD;  Location: [facility] ENDOSCOPY;  Service: Endoscopy;  Laterality: N/A;  . ESOPHAGOGASTRODUODENOSCOPY N/A 07/22/2015   Procedure: ESOPHAGOGASTRODUODENOSCOPY (EGD);  Surgeon: Manya Silvas, MD;  Location: [facility] ENDOSCOPY;  Service: Endoscopy;  Laterality: N/A;  . ESOPHAGOGASTRODUODENOSCOPY N/A 06/26/2017   Procedure: ESOPHAGOGASTRODUODENOSCOPY (EGD);  Surgeon: Manya Silvas, MD;  Location: [facility] ENDOSCOPY;  Service: Endoscopy;  Laterality: N/A;  . ESOPHAGOGASTRODUODENOSCOPY N/A 06/27/2017   Procedure: ESOPHAGOGASTRODUODENOSCOPY (EGD);  Surgeon: Manya Silvas, MD;  Location: [facility] ENDOSCOPY;  Service: Endoscopy;  Laterality: N/A;  . ESOPHAGOGASTRODUODENOSCOPY N/A 06/28/2017   Procedure: ESOPHAGOGASTRODUODENOSCOPY (EGD);  Surgeon: Manya Silvas, MD;  Location: [facility] ENDOSCOPY;  Service: Endoscopy;  Laterality: N/A;  . ESOPHAGOGASTRODUODENOSCOPY Bilateral 10/11/2017   Procedure: ESOPHAGOGASTRODUODENOSCOPY (EGD);  Surgeon: Manya Silvas, MD;  Location: [facility] ENDOSCOPY;  Service: Endoscopy;  Laterality: Bilateral;  . ESOPHAGOGASTRODUODENOSCOPY (EGD) WITH PROPOFOL N/A 07/20/2015   Procedure: ESOPHAGOGASTRODUODENOSCOPY (EGD) WITH PROPOFOL;  Surgeon: Manya Silvas, MD;  Location: [facility] ENDOSCOPY;  Service: Endoscopy;  Laterality: N/A;  . ESOPHAGOGASTRODUODENOSCOPY (EGD) WITH PROPOFOL N/A 09/16/2015   Procedure: ESOPHAGOGASTRODUODENOSCOPY (EGD) WITH PROPOFOL;  Surgeon: Manya Silvas, MD;  Location: [facility] ENDOSCOPY;  Service: Endoscopy;  Laterality: N/A;  . ESOPHAGOGASTRODUODENOSCOPY (EGD) WITH PROPOFOL N/A 03/16/2016   Procedure:  ESOPHAGOGASTRODUODENOSCOPY (EGD) WITH PROPOFOL;  Surgeon: Manya Silvas, MD;  Location[facility] ENDOSCOPY;  Service: Endoscopy;  Laterality: N/A;  . ESOPHAGOGASTRODUODENOSCOPY (EGD) WITH PROPOFOL N/A 09/14/2016   Procedure: ESOPHAGOGASTRODUODENOSCOPY (EGD) WITH PROPOFOL;  Surgeon: Manya Silvas, MD;  Location: [facility] ENDOSCOPY;  Service: Endoscopy;  Laterality: N/A;  . ESOPHAGOGASTRODUODENOSCOPY (EGD) WITH PROPOFOL N/A 11/05/2016   Procedure: ESOPHAGOGASTRODUODENOSCOPY (EGD) WITH PROPOFOL;  Surgeon: Manya Silvas, MD;  Location: [facility] ENDOSCOPY;  Service: Endoscopy;  Laterality: N/A;  . ESOPHAGOGASTRODUODENOSCOPY (EGD) WITH PROPOFOL N/A 02/06/2017   Procedure: ESOPHAGOGASTRODUODENOSCOPY (EGD) WITH PROPOFOL;  Surgeon: Manya Silvas, MD;  Location: [facility] ENDOSCOPY;  Service: Endoscopy;  Laterality: N/A;  . ESOPHAGOGASTRODUODENOSCOPY (EGD) WITH PROPOFOL N/A 04/24/2017   Procedure: ESOPHAGOGASTRODUODENOSCOPY (EGD) WITH PROPOFOL;  Surgeon: Manya Silvas, MD;  Location: [facility] ENDOSCOPY;  Service: Endoscopy;  Laterality: N/A;  . ESOPHAGOGASTRODUODENOSCOPY (EGD) WITH PROPOFOL N/A 07/31/2017   Procedure: ESOPHAGOGASTRODUODENOSCOPY (EGD) WITH PROPOFOL;  Surgeon: Manya Silvas, MD;  Location: [facility] ENDOSCOPY;  Service: Endoscopy;  Laterality: N/A;  . TONSILLECTOMY    . TONSILLECTOMY AND ADENOIDECTOMY    . ULNAR NERVE TRANSPOSITION    . UVULOPALATOPHARYNGOPLASTY      SOCIAL HISTORY:  Social History   Tobacco Use  . Smoking status: Current Some Day Smoker    Types: Cigars  . Smokeless tobacco: Never Used  . Tobacco comment: smoke 1 at night occasionally;   Substance Use Topics  . Alcohol use: No    Comment: stopped 12 years ago    FAMILY HISTORY:  Family History  Problem Relation Age of Onset  . Diabetes Other     DRUG ALLERGIES: No Known Allergies  REVIEW OF SYSTEMS:   Could not be obtained as patient is confused  MEDICATIONS AT HOME:  Prior to Admission medications    Medication Sig Start Date End Date Taking? Authorizing Provider  FERROUS SULFATE ER PO Take 325 mg by mouth 2 (two) times daily.    Yes [provider]  furosemide (LASIX) 40 MG tablet Take 40 mg by mouth 2 (two) times daily.   Yes [provider]  insulin aspart protamine- aspart (NOVOLOG MIX 70/30) (70-30) 100 UNIT/ML injection Inject 0.5 mLs (50 Units total) into the skin 2 (two) times daily with a meal. Pt uses 84 units with breakfast and 62 units with supper. Patient taking differently: Inject 65-84 Units into the skin 2 (two) times daily with a meal. 84-AM 65-PM 02/22/17  Yes Fritzi Mandes, MD  lactulose (CHRONULAC) 10 GM/15ML solution Take 30 g by mouth 3 (three) times daily.    Yes [provider]  metolazone (ZAROXOLYN) 2.5 MG tablet Take 2.5 mg by mouth daily.   Yes [provider]  Multiple Vitamin (MULTIVITAMIN) tablet Take 1 tablet by mouth daily.   Yes [provider]  omeprazole (PRILOSEC) 40 MG capsule Take 40 mg by mouth 2 (two) times daily.   Yes [provider]  rifaximin (XIFAXAN) 550 MG TABS tablet Take 550 mg by mouth 2 (two) times daily.   Yes [provider]  spironolactone (ALDACTONE) 25 MG tablet Take 25 mg by mouth daily.   Yes [provider]  acidophilus (RISAQUAD) CAPS capsule Take 2 capsules by mouth 3 (three) times  daily. Patient not taking: Reported on 10/17/2017 09/05/17   Nicholes Mango, MD  ammonium lactate (AMLACTIN) 12 % cream Apply topically 2 (two) times daily.    [provider]  Hydrocortisone (GERHARDT'S BUTT CREAM) CREA Apply 1 application topically 2 (two) times daily. 09/05/17   Gouru, Illene Silver, MD  LORazepam (ATIVAN) 0.5 MG tablet Take 0.5 mg by mouth 3 (three) times daily as needed for anxiety.     [provider]  potassium chloride SA (K-DUR,KLOR-CON) 20 MEQ tablet Take 1 tablet (20 mEq total) by mouth 2 (two) times daily. Take BID for 2 days after  That resume once  aday Patient not taking: Reported on 10/17/2017 10/11/17 01/09/18  Epifanio Lesches, MD  traMADol (ULTRAM) 50 MG tablet Take 50 mg by mouth every 8 (eight) hours as needed.     [provider]      PHYSICAL EXAMINATION:   VITAL SIGNS: Blood pressure 140/67, pulse 62, temperature 98.6 F (37 C), temperature source Oral, resp. rate 15, weight (!) 140.6 kg (310 lb), SpO2 97 %.  GENERAL:  67 y.o.-year-old well built male patient lying in the bed with no acute distress.  EYES: Pupils equal, round, reactive to light and accommodation. No scleral icterus. Extraocular muscles intact.  HEENT: Head atraumatic, normocephalic. Oropharynx and nasopharynx clear.  NECK:  Supple, no jugular venous distention. No thyroid enlargement, no tenderness.  LUNGS: Normal breath sounds bilaterally, no wheezing, rales,rhonchi or crepitation. No use of accessory muscles of respiration.  CARDIOVASCULAR: S1, S2 normal. No murmurs, rubs, or gallops.  ABDOMEN: Soft, nontender, nondistended. Bowel sounds present. No organomegaly or mass.  EXTREMITIES: No pedal edema, cyanosis, or clubbing.  NEUROLOGIC: Cranial nerves II through XII are intact. Muscle strength 5/5 in all extremities. Sensation intact. Gait not checked. Not oriented to time, place and person. PSYCHIATRIC: could not be assessed SKIN: No obvious rash, lesion, or ulcer.   LABORATORY PANEL:   CBC Recent Labs  Lab 10/17/17 1801  WBC 5.5  HGB 7.7*  HCT 23.3*  PLT 117*  MCV 94.9  MCH 31.4  MCHC 33.1  RDW 16.7*   ------------------------------------------------------------------------------------------------------------------  Chemistries  Recent Labs  Lab 10/11/17 0428 10/17/17 1801  NA 138 140  K 3.1* 3.3*  CL 100* 108  CO2 29 22  GLUCOSE 215* 106*  BUN 31* 31*  CREATININE 1.98* 1.86*  CALCIUM 9.3 9.0  MG 2.1  --   AST  --  38  ALT  --  24  ALKPHOS  --  77  BILITOT  --  0.8    ------------------------------------------------------------------------------------------------------------------ estimated creatinine clearance is 55.3 mL/min (A) (by C-G formula based on SCr of 1.86 mg/dL (H)). ------------------------------------------------------------------------------------------------------------------ No results for input(s): TSH, T4TOTAL, T3FREE, THYROIDAB in the last 72 hours.  Invalid input(s): FREET3   Coagulation profile No results for input(s): INR, PROTIME in the last 168 hours. ------------------------------------------------------------------------------------------------------------------- No results for input(s): DDIMER in the last 72 hours. -------------------------------------------------------------------------------------------------------------------  Cardiac Enzymes Recent Labs  Lab 10/17/17 1938  TROPONINI <0.03   ------------------------------------------------------------------------------------------------------------------ Invalid input(s): POCBNP  ---------------------------------------------------------------------------------------------------------------  Urinalysis    Component Value Date/Time   COLORURINE YELLOW (A) 10/09/2017 1521   APPEARANCEUR CLEAR (A) 10/09/2017 1521   APPEARANCEUR Clear 09/10/2014 0456   LABSPEC 1.017 10/09/2017 1521   LABSPEC 1.016 09/10/2014 0456   PHURINE 6.0 10/09/2017 1521   GLUCOSEU >=500 (A) 10/09/2017 1521   GLUCOSEU Negative 09/10/2014 0456   HGBUR NEGATIVE 10/09/2017 [place] 10/09/2017 1521   BILIRUBINUR Negative 09/10/2014 0456  KETONESUR NEGATIVE 10/09/2017 1521   PROTEINUR NEGATIVE 10/09/2017 1521   NITRITE NEGATIVE 10/09/2017 1521   LEUKOCYTESUR NEGATIVE 10/09/2017 1521   LEUKOCYTESUR Negative 09/10/2014 0456     RADIOLOGY: Dg Chest 2 View  Result Date: 10/17/2017 CLINICAL DATA:  Altered mental status. EXAM: CHEST  2 VIEW COMPARISON:  Chest x-ray dated  October 09, 2017. FINDINGS: The heart size and mediastinal contours are within normal limits. Persistent mild pulmonary vascular congestion. Low lung volumes with bronchovascular crowding and bibasilar atelectasis. No focal consolidation, pleural effusion, or pneumothorax. No acute osseous abnormality. IMPRESSION: Low lung volumes.  No active cardiopulmonary disease. Electronically Signed   By: Titus Dubin M.D.   On: 10/17/2017 20:10   Ct Head Wo Contrast  Result Date: 10/17/2017 CLINICAL DATA:  Pt brought in by niece from home with reports of dizziness and increased confusion. Pt is AANDOx4, but per niece pt has been having recent bizarre behavior, including thinking someone ransacked his house and taking several days worth of medication or not any at all. EXAM: CT HEAD WITHOUT CONTRAST TECHNIQUE: Contiguous axial images were obtained from the base of the skull through the vertex without intravenous contrast. COMPARISON:  02/21/2017 FINDINGS: Brain: There is central and cortical atrophy. There is no intra or extra-axial fluid collection or mass lesion. The basilar cisterns and ventricles have a normal appearance. There is no CT evidence for acute infarction or hemorrhage. Vascular: There is atherosclerotic calcifications of the carotic siphons. Skull: Normal. Negative for fracture or focal lesion. Sinuses/Orbits: There is mild mucosal thickening of the paranasal sinuses. Other: None IMPRESSION: 1.  No evidence for acute  abnormality. 2. Mild atrophy. Electronically Signed   By: Nolon Nations M.D.   On: 10/17/2017 20:02    EKG: Orders placed or performed during the hospital encounter of 10/17/17  . EKG 12-Lead  . EKG 12-Lead  . ED EKG  . ED EKG    IMPRESSION AND PLAN: 67 year old male patient with history of cirrhosis of liver, esophageal varices, chronic kidney disease presented to the emergency room with somnolence, confusion.  Admitting diagnosis 1.  Hepatic encephalopathy 2.  Altered  mental status secondary to hepatic encephalopathy 3.  Hypokalemia 4.  Cirrhosis of liver Treatment plan Admit patient to medical floor Oral lactulose every 6 hourly Follow-up ammonia level Resume Xifaxan, Lasix and Aldactone Replace potassium DVT prophylaxis sequential compression device to lower extremities  All the records are reviewed and case discussed with ED provider. Management plans discussed with the patient, family and they are in agreement.  CODE STATUS:FULL CODE Code Status History    Date Active Date Inactive Code Status Order ID Comments User Context   10/09/2017 21:39 10/11/2017 18:06 Full Code [id number]  [name], MD Inpatient   09/03/2017 21:24 09/05/2017 15:52 Full Code [id number]  Nicholes Mango, MD Inpatient   06/26/2017 11:19 06/28/2017 17:48 Full Code [id number]  Henreitta Leber, MD Inpatient   02/21/2017 20:07 02/22/2017 18:03 Full Code [id number]  [name], MD Inpatient   07/20/2015 12:45 07/22/2015 22:17 Full Code [id number]  Nicholes Mango, MD Inpatient   02/08/2015 23:31 02/11/2015 19:43 Full Code [id number]  Hower, Aaron Mose, MD ED       TOTAL TIME TAKING CARE OF THIS PATIENT: 53 minutes.    Saundra Shelling M.D on 10/18/2017 at 12:46 AM  Between 7am to 6pm - Pager - [phone]  After 6pm go to [email or web address] - password [email or web address] [facility]  [facility]  Office  [phone]  CC: Primary care physician; Edwina Barth,  Chrystie Nose, MD

## 2017-10-18 NOTE — Progress Notes (Addendum)
Patient is more alert and awake oriented.  Ammonia level decreased to 44. He had hypoglycemia episode this morning. He complains of generalized weakness. Vital signs are stable. Physical examination is done.67 year old male patient with history of cirrhosis of liver, esophageal varices, chronic kidney disease presented to the emergency room with somnolence, confusion. A/P 1.  Hepatic encephalopathy 2.  Altered mental status secondary to hepatic encephalopathy, improving. 3.  Hypokalemia.  Improved. 4.  Cirrhosis of liver 5.  CKD stage III.  Stable. 6.  Anemia of chronic disease.  Hemoglobin stable. 7. Tobacco abuse.  Smoking cessation was counseled for 3-4 minutes.  Continue current treatment with lactulose, rifaximin, Lasix and Aldactone. Start Levemir 15 units twice daily with sliding scale.  May resume home insulin dose on discharge per diabetes coordinator.  Discussed with RN, case manager, diabetes coordinator. Discussed with the patient and niece.  Time spent about 35 minutes.

## 2017-10-18 NOTE — Progress Notes (Signed)
Inpatient Diabetes Program Recommendations  AACE/ADA: New Consensus Statement on Inpatient Glycemic Control (2015)  Target Ranges:  Prepandial:   less than 140 mg/dL      Peak postprandial:   less than 180 mg/dL (1-2 hours)      Critically ill patients:  140 - 180 mg/dL   Lab Results  Component Value Date   GLUCAP 207 (H) 10/18/2017   HGBA1C 7.0 (H) 09/04/2017    Review of Glycemic Control  Results for [name], [name] (MRN [id number]) as of 10/18/2017 12:59  Ref. Range 10/17/2017 19:30 10/18/2017 06:00 10/18/2017 06:21 10/18/2017 07:37 10/18/2017 12:02  Glucose-Capillary Latest Ref Range: 65 - 99 mg/dL 69 51 (L) 92 138 (H) 207 (H)    Diabetes history:Type 2 Outpatient Diabetes medications:Novolog mix 70/30 84 units qam, Novolog 70/30 65units qsupper  Current orders for Inpatient glycemic control: Novolog 0-9 units tid, Novolog 0-5 unit qhs,   Inpatient Diabetes Program Recommendations:Levemir 15 units bid, Novolog 2 units tid, continue Novolog correction 0-9 units tid and has as ordered.   Met with patient to discuss recent insulin use. Patient does not remember if he took his insulin yesterday. He does not know if he ate.  Patient will need basal and long acting insulin during admission- please assess needs for 70/30 dosing at discharge- his A1C of 7.0% would make [place] believe that his current doses are correct.  Gentry Fitz, RN, BA, MHA, CDE Diabetes Coordinator Inpatient Diabetes Program  [phone] (Team Pager) [phone] ([place]) 10/18/2017 1:08 PM

## 2017-10-19 LAB — BASIC METABOLIC PANEL
Anion gap: 7 (ref 5–15)
BUN: 26 mg/dL — AB (ref 6–20)
CHLORIDE: 111 mmol/L (ref 101–111)
CO2: 21 mmol/L — ABNORMAL LOW (ref 22–32)
CREATININE: 1.8 mg/dL — AB (ref 0.61–1.24)
Calcium: 8.2 mg/dL — ABNORMAL LOW (ref 8.9–10.3)
GFR calc Af Amer: 44 mL/min — ABNORMAL LOW (ref >60)
GFR calc non Af Amer: 38 mL/min — ABNORMAL LOW (ref >60)
Glucose, Bld: 204 mg/dL — ABNORMAL HIGH (ref 65–99)
Potassium: 3.4 mmol/L — ABNORMAL LOW (ref 3.5–5.1)
Sodium: 139 mmol/L (ref 135–145)

## 2017-10-19 LAB — GLUCOSE, CAPILLARY
Glucose-Capillary: 228 mg/dL — ABNORMAL HIGH (ref 65–99)
Glucose-Capillary: 185 mg/dL — ABNORMAL HIGH (ref 65–99)
Glucose-Capillary: 200 mg/dL — ABNORMAL HIGH (ref 65–99)

## 2017-10-19 LAB — AMMONIA: Ammonia: 36 umol/L — ABNORMAL HIGH (ref 9–35)

## 2017-10-19 MED ORDER — INSULIN ASPART PROT & ASPART (70-30 MIX) 100 UNIT/ML ~~LOC~~ SUSP: 65.0000 [IU] | Freq: Two times a day (BID) | SUBCUTANEOUS | Status: DC

## 2017-10-19 MED ORDER — POTASSIUM CHLORIDE CRYS ER 20 MEQ PO TBCR: 20.0000 meq | EXTENDED_RELEASE_TABLET | Freq: Every day | ORAL | 2 refills | Status: DC

## 2017-10-19 NOTE — Discharge Summary (Signed)
[facility] [place] NAME: Corey West    MR#:  [id number]  DATE OF BIRTH:  1951/03/15  DATE OF ADMISSION:  10/17/2017 ADMITTING PHYSICIAN: Saundra Shelling, MD  DATE OF DISCHARGE: 10/19/2017  PRIMARY CARE PHYSICIAN: Baxter Hire, MD    ADMISSION DIAGNOSIS:  Somnolence [R40.0] Hyperammonemia ([place]) [E72.20] Altered mental status, unspecified altered mental status type [R41.82]  DISCHARGE DIAGNOSIS:  Active Problems:   Hepatic encephalopathy ([place])   SECONDARY DIAGNOSIS:   Past Medical History:  Diagnosis Date  . Anemia   . Anxiety    controlled;   . Arthritis   . AVM (arteriovenous malformation) of stomach, acquired with hemorrhage   . Barrett's esophagus   . Chronic kidney disease    renal infufficiency  . Cirrhosis ([place])   . Depression    controlled;   Marland Kitchen Diabetes mellitus without complication ([place])    not controlled, taking insulin but sugar continues to run high;   . Edema   . Esophageal varices ([place])   . GERD (gastroesophageal reflux disease)   . History of hiatal hernia   . Hyperlipidemia   . Hypertension    controlled well;   . 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 Obesity   . Pancytopenia ([place])   . Polyp, stomach    with chronic blood loss  . Sleep apnea    does not wear a cpap, Medicare would not pay for it   . Venous stasis dermatitis of both lower extremities   . Venous stasis of both lower extremities    with cellulitis    HOSPITAL COURSE:   67 year old male with history of diabetes and EtOH related liver cirrhosis who presents with confusion.  1. Acute hepatic encephalopathy: Patient's mental status has improved. He will continue lactulose and Xifaxan. He will follow-up with Dr. Vira Agar.  2. Hypokalemia: This was repleted and he will continue potassium supplementation  3. Liver cirrhosis: Patient will continue on Lasix  4. Chronic kidney disease stage III: Creatinine remained stable  5. Anemia of  chronic disease: Hemoglobin remained stable  6. Diabetes: Patient will resume ADA diet and his outpatient medication.  7. Tobacco dependence: Patient is encouraged to quit smoking. Counseling was provided for 4 minutes.   DISCHARGE CONDITIONS AND DIET:   Stable Diabetic diet  CONSULTS OBTAINED:    DRUG ALLERGIES:  No Known Allergies  DISCHARGE MEDICATIONS:   Allergies as of 10/19/2017   No Known Allergies     Medication List    STOP taking these medications   acidophilus Caps capsule   ammonium lactate 12 % cream Commonly known as:  AMLACTIN   metolazone 2.5 MG tablet Commonly known as:  ZAROXOLYN     TAKE these medications   FERROUS SULFATE ER PO Take 325 mg by mouth 2 (two) times daily.   furosemide 40 MG tablet Commonly known as:  LASIX Take 40 mg by mouth 2 (two) times daily.   Gerhardt's butt cream Crea Apply 1 application topically 2 (two) times daily.   insulin aspart protamine- aspart (70-30) 100 UNIT/ML injection Commonly known as:  NOVOLOG MIX 70/30 Inject 0.65-0.84 mLs (65-84 Units total) into the skin 2 (two) times daily with a meal. Pt uses 84 units with breakfast and 62 units with supper. What changed:  how much to take   lactulose 10 GM/15ML solution Commonly known as:  CHRONULAC Take 30 g by mouth 3 (three) times daily.   LORazepam 0.5 MG tablet Commonly known  as:  ATIVAN Take 0.5 mg by mouth 3 (three) times daily as needed for anxiety.   multivitamin tablet Take 1 tablet by mouth daily.   omeprazole 40 MG capsule Commonly known as:  PRILOSEC Take 40 mg by mouth 2 (two) times daily.   potassium chloride SA 20 MEQ tablet Commonly known as:  K-DUR,KLOR-CON Take 1 tablet (20 mEq total) by mouth daily. Take BID for 2 days after  That resume once aday What changed:  when to take this   rifaximin 550 MG Tabs tablet Commonly known as:  XIFAXAN Take 550 mg by mouth 2 (two) times daily.   spironolactone 25 MG tablet Commonly known as:   ALDACTONE Take 25 mg by mouth daily.   traMADol 50 MG tablet Commonly known as:  ULTRAM Take 50 mg by mouth every 8 (eight) hours as needed.            Discharge Care Instructions  (From admission, onward)        Start     Ordered   10/19/17 0000  Discharge wound care:    Comments:  CLeanse bilateral legs with soap and water.  Moisturize with barrier cream.  Apply removable compression Apply unna boots   10/19/17 0834        Today   CHIEF COMPLAINT:  Doing well wants to get up and move mental status is normal    VITAL SIGNS:  Blood pressure (!) 141/47, pulse 76, temperature 98.4 F (36.9 C), temperature source Oral, resp. rate 17, height 5\' 9"  (1.753 m), weight (!) 140.6 kg (310 lb), SpO2 100 %.   REVIEW OF SYSTEMS:  Review of Systems  Constitutional: Negative.  Negative for chills, fever and malaise/fatigue.  HENT: Negative.  Negative for ear discharge, ear pain, hearing loss, nosebleeds and sore throat.   Eyes: Negative.  Negative for blurred vision and pain.  Respiratory: Negative.  Negative for cough, hemoptysis, shortness of breath and wheezing.   Cardiovascular: Negative.  Negative for chest pain, palpitations and leg swelling.  Gastrointestinal: Negative.  Negative for abdominal pain, blood in stool, diarrhea, nausea and vomiting.  Genitourinary: Negative.  Negative for dysuria.  Musculoskeletal: Negative.  Negative for back pain.  Skin:       Blisters on legs  Neurological: Negative for dizziness, tremors, speech change, focal weakness, seizures and headaches.  Endo/Heme/Allergies: Negative.  Does not bruise/bleed easily.  Psychiatric/Behavioral: Negative.  Negative for depression, hallucinations and suicidal ideas.     PHYSICAL EXAMINATION:  GENERAL:  67 y.o.-year-old patient lying in the bed with no acute distress.  NECK:  Supple, no jugular venous distention. No thyroid enlargement, no tenderness.  LUNGS: Normal breath sounds bilaterally, no  wheezing, rales,rhonchi  No use of accessory muscles of respiration.  CARDIOVASCULAR: S1, S2 normal. No murmurs, rubs, or gallops.  ABDOMEN: Soft, non-tender, non-distended. Bowel sounds present. No organomegaly or mass.  EXTREMITIES: 1+ LEE NO cyanosis, or clubbing.  PSYCHIATRIC: The patient is alert and oriented x 3.  SKIN: blister right leg Legs wrapped.   DATA REVIEW:   CBC Recent Labs  Lab 10/18/17 0501  WBC 4.8  HGB 7.5*  HCT 22.4*  PLT COUNT MAY BE INACCURATE DUE TO FIBRIN CLUMPS.    Chemistries  Recent Labs  Lab 10/17/17 1801  10/19/17 0721  NA 140   < > 139  K 3.3*   < > 3.4*  CL 108   < > 111  CO2 22   < > 21*  GLUCOSE 106*   < >  204*  BUN 31*   < > 26*  CREATININE 1.86*   < > 1.80*  CALCIUM 9.0   < > 8.2*  AST 38  --   --   ALT 24  --   --   ALKPHOS 77  --   --   BILITOT 0.8  --   --    < > = values in this interval not displayed.    Cardiac Enzymes Recent Labs  Lab 10/17/17 1938  TROPONINI <0.03    Microbiology Results  @MICRORSLT48 @  RADIOLOGY:  Dg Chest 2 View  Result Date: 10/17/2017 CLINICAL DATA:  Altered mental status. EXAM: CHEST  2 VIEW COMPARISON:  Chest x-ray dated October 09, 2017. FINDINGS: The heart size and mediastinal contours are within normal limits. Persistent mild pulmonary vascular congestion. Low lung volumes with bronchovascular crowding and bibasilar atelectasis. No focal consolidation, pleural effusion, or pneumothorax. No acute osseous abnormality. IMPRESSION: Low lung volumes.  No active cardiopulmonary disease. Electronically Signed   By: Titus Dubin M.D.   On: 10/17/2017 20:10   Ct Head Wo Contrast  Result Date: 10/17/2017 CLINICAL DATA:  Pt brought in by niece from home with reports of dizziness and increased confusion. Pt is AANDOx4, but per niece pt has been having recent bizarre behavior, including thinking someone ransacked his house and taking several days worth of medication or not any at all. EXAM: CT HEAD  WITHOUT CONTRAST TECHNIQUE: Contiguous axial images were obtained from the base of the skull through the vertex without intravenous contrast. COMPARISON:  02/21/2017 FINDINGS: Brain: There is central and cortical atrophy. There is no intra or extra-axial fluid collection or mass lesion. The basilar cisterns and ventricles have a normal appearance. There is no CT evidence for acute infarction or hemorrhage. Vascular: There is atherosclerotic calcifications of the carotic siphons. Skull: Normal. Negative for fracture or focal lesion. Sinuses/Orbits: There is mild mucosal thickening of the paranasal sinuses. Other: None IMPRESSION: 1.  No evidence for acute  abnormality. 2. Mild atrophy. Electronically Signed   By: Nolon Nations M.D.   On: 10/17/2017 20:02      Allergies as of 10/19/2017   No Known Allergies     Medication List    STOP taking these medications   acidophilus Caps capsule   ammonium lactate 12 % cream Commonly known as:  AMLACTIN   metolazone 2.5 MG tablet Commonly known as:  ZAROXOLYN     TAKE these medications   FERROUS SULFATE ER PO Take 325 mg by mouth 2 (two) times daily.   furosemide 40 MG tablet Commonly known as:  LASIX Take 40 mg by mouth 2 (two) times daily.   Gerhardt's butt cream Crea Apply 1 application topically 2 (two) times daily.   insulin aspart protamine- aspart (70-30) 100 UNIT/ML injection Commonly known as:  NOVOLOG MIX 70/30 Inject 0.65-0.84 mLs (65-84 Units total) into the skin 2 (two) times daily with a meal. Pt uses 84 units with breakfast and 62 units with supper. What changed:  how much to take   lactulose 10 GM/15ML solution Commonly known as:  CHRONULAC Take 30 g by mouth 3 (three) times daily.   LORazepam 0.5 MG tablet Commonly known as:  ATIVAN Take 0.5 mg by mouth 3 (three) times daily as needed for anxiety.   multivitamin tablet Take 1 tablet by mouth daily.   omeprazole 40 MG capsule Commonly known as:  PRILOSEC Take  40 mg by mouth 2 (two) times daily.   potassium  chloride SA 20 MEQ tablet Commonly known as:  K-DUR,KLOR-CON Take 1 tablet (20 mEq total) by mouth daily. Take BID for 2 days after  That resume once aday What changed:  when to take this   rifaximin 550 MG Tabs tablet Commonly known as:  XIFAXAN Take 550 mg by mouth 2 (two) times daily.   spironolactone 25 MG tablet Commonly known as:  ALDACTONE Take 25 mg by mouth daily.   traMADol 50 MG tablet Commonly known as:  ULTRAM Take 50 mg by mouth every 8 (eight) hours as needed.            Discharge Care Instructions  (From admission, onward)        Start     Ordered   10/19/17 0000  Discharge wound care:    Comments:  CLeanse bilateral legs with soap and water.  Moisturize with barrier cream.  Apply removable compression Apply unna boots   10/19/17 0834         Management plans discussed with the patient and he is in agreement. Stable for discharge   Patient should follow up with pcp  CODE STATUS:     Code Status Orders  (From admission, onward)        Start     Ordered   10/18/17 0059  Full code  Continuous     10/18/17 0058    Code Status History    Date Active Date Inactive Code Status Order ID Comments User Context   10/09/2017 21:39 10/11/2017 18:06 Full Code [id number]  [name], MD Inpatient   09/03/2017 21:24 09/05/2017 15:52 Full Code [id number]  Nicholes Mango, MD Inpatient   06/26/2017 11:19 06/28/2017 17:48 Full Code [id number]  Henreitta Leber, MD Inpatient   02/21/2017 20:07 02/22/2017 18:03 Full Code [id number]  [name], MD Inpatient   07/20/2015 12:45 07/22/2015 22:17 Full Code [id number]  Nicholes Mango, MD Inpatient   02/08/2015 23:31 02/11/2015 19:43 Full Code [id number]  Hower, Aaron Mose, MD ED      TOTAL TIME TAKING CARE OF THIS PATIENT: 38 minutes.    Note: This dictation was prepared with Dragon dictation along with smaller phrase technology. Any transcriptional errors that  result from this process are unintentional.  Bekah Igoe M.D on 10/19/2017 at 8:34 AM  Between 7am to 6pm - Pager - [phone] After 6pm go to [email or web address] - password [email or web address] [facility]  Office  [phone]  CC: Primary care physician; Baxter Hire, MD

## 2017-10-19 NOTE — Care Management Note (Signed)
Case Management Note  Patient Details  Name: Corey West MRN: [id number] Date of Birth: 03-17-1951  Subjective/Objective:     Discussed discharge planning with Mr Cueto. Explained home health services. Mr Michon chose [facility] and a referral for HH=PT, RN was called to [facility].                 Action/Plan:   Expected Discharge Date:  10/19/17               Expected Discharge Plan:  [place]  In-House Referral:     Discharge planning Services  CM Consult  Post Acute Care Choice:  Home Health Choice offered to:  Patient  DME Arranged:    DME Agency:     HH Arranged:  PT, RN HH Agency:  [facility]  Status of Service:  Completed, signed off  If discussed at [place] of Stay Meetings, dates discussed:    Additional Comments:  Sherleen Pangborn A, RN 10/19/2017, 11:18 AM

## 2017-10-19 NOTE — Progress Notes (Signed)
Pt is being discharged today, discharge paperwork was reviewed with the patient, he verified understanding. 0 paper prescriptions were given to the patient. IV x1 was removed,all belongings packed and returned to him. He will be wheeled out in a wheelchair by staff.

## 2017-10-19 NOTE — Evaluation (Signed)
Physical Therapy Evaluation Patient Details Name: Corey West MRN: [id number] DOB: 1951-04-23 Today's Date: 10/19/2017   History of Present Illness  67 y.o. male with a known history of cirrhosis of liver, chronic kidney disease, arthritis, arteriovenous malformations of the stomach, Barrett's esophagus presented to the emergency room with confusion and somnolence  Clinical Impression  Pt is able ambulate around the nurses' station safely and with good confidence.  He reports feeling good about going home and agrees that he does not need further PT intervention.  Overall pt appears to be at baseline and does not require further PT intervention.     Follow Up Recommendations No PT follow up    Equipment Recommendations       Recommendations for Other Services       Precautions / Restrictions Precautions Precautions: Fall Restrictions Weight Bearing Restrictions: No      Mobility  Bed Mobility               General bed mobility comments: Pt in recliner, not tested  Transfers Overall transfer level: Independent Equipment used: Straight cane             General transfer comment: Pt easily able to rise w/o assist  Ambulation/Gait Ambulation/Gait assistance: Modified independent (Device/Increase time) Ambulation Distance (Feet): 250 Feet Assistive device: Straight cane       General Gait Details: Pt with consistent, confident gait.  He has no LOBs, was not overly reliant on the cane and generally did well.  Vitals remained stable and he reports being near his baseline.   Stairs Stairs: Yes Stairs assistance: Modified independent (Device/Increase time) Stair Management: With cane;One rail Right Number of Stairs: 6 General stair comments: Pt able to negotiate up/down steps w/o issue  Wheelchair Mobility    Modified Rankin (Stroke Patients Only)       Balance Overall balance assessment: Independent                                          Pertinent Vitals/Pain Pain Assessment: No/denies pain    Home Living Family/patient expects to be discharged to:: Private residence Living Arrangements: Alone Available Help at Discharge: Family(niece helps with errands, there 1x/wk)   Home Access: Stairs to enter Entrance Stairs-Rails: Right;Left Entrance Stairs-Number of Steps: 4 (uses R railing) Home Layout: One level Home Equipment: Walker - 2 wheels;Cane - single point      Prior Function Level of Independence: Independent with assistive device(s)         Comments: Pt uses [employer].  Pt works part time 4 hours a day for 4 days a week.  (+) driving.      Hand Dominance        Extremity/Trunk Assessment   Upper Extremity Assessment Upper Extremity Assessment: Generalized weakness;Overall [facility] for tasks assessed    Lower Extremity Assessment Lower Extremity Assessment: Overall WFL for tasks assessed;Generalized weakness       Communication   Communication: No difficulties  Cognition Arousal/Alertness: Awake/alert Behavior During Therapy: WFL for tasks assessed/performed Overall Cognitive Status: Within Functional Limits for tasks assessed                                        General Comments      Exercises     Assessment/Plan  PT Assessment Patent does not need any further PT services  PT Problem List         PT Treatment Interventions      PT Goals (Current goals can be found in the Care Plan section)  Acute Rehab PT Goals Patient Stated Goal: go home today PT Goal Formulation: All assessment and education complete, DC therapy    Frequency     Barriers to discharge        Co-evaluation               AM-PAC PT "6 Clicks" Daily Activity  Outcome Measure Difficulty turning over in bed (including adjusting bedclothes, sheets and blankets)?: None Difficulty moving from lying on back to sitting on the side of the bed? : None Difficulty sitting down on and standing  up from a chair with arms (e.g., wheelchair, bedside commode, etc,.)?: None Help needed moving to and from a bed to chair (including a wheelchair)?: None Help needed walking in hospital room?: None Help needed climbing 3-5 steps with a railing? : None 6 Click Score: 24    End of Session Equipment Utilized During Treatment: Gait belt Activity Tolerance: Patient tolerated treatment well Patient left: with chair alarm set;with call bell/phone within reach   PT Visit Diagnosis: Muscle weakness (generalized) (M62.81);Difficulty in walking, not elsewhere classified (R26.2)    Time: 1364-3837 PT Time Calculation (min) (ACUTE ONLY): 28 min   Charges:   PT Evaluation $PT Eval Low Complexity: 1 Low     PT G Codes:        Kreg Shropshire, DPT 10/19/2017, 3:32 PM

## 2017-10-19 NOTE — Plan of Care (Signed)
  Progressing Education: Knowledge of General Education information will improve 10/19/2017 0448 - Progressing by Denice Bors, RN Health Behavior/Discharge Planning: Ability to manage health-related needs will improve 10/19/2017 0448 - Progressing by Denice Bors, RN Clinical Measurements: Ability to maintain clinical measurements within normal limits will improve 10/19/2017 0448 - Progressing by Denice Bors, RN Will remain free from infection 10/19/2017 0448 - Progressing by Denice Bors, RN Diagnostic test results will improve 10/19/2017 0448 - Progressing by Denice Bors, RN Respiratory complications will improve 10/19/2017 0448 - Progressing by Denice Bors, RN Cardiovascular complication will be avoided 10/19/2017 0448 - Progressing by Denice Bors, RN Activity: Risk for activity intolerance will decrease 10/19/2017 0448 - Progressing by Denice Bors, RN Nutrition: Adequate nutrition will be maintained 10/19/2017 0448 - Progressing by Denice Bors, RN Coping: Level of anxiety will decrease 10/19/2017 0448 - Progressing by Denice Bors, RN Elimination: Will not experience complications related to bowel motility 10/19/2017 0448 - Progressing by Denice Bors, RN Will not experience complications related to urinary retention 10/19/2017 0448 - Progressing by Denice Bors, RN Pain Managment: General experience of comfort will improve 10/19/2017 0448 - Progressing by Denice Bors, RN Safety: Ability to remain free from injury will improve 10/19/2017 0448 - Progressing by Denice Bors, RN Skin Integrity: Risk for impaired skin integrity will decrease 10/19/2017 0448 - Progressing by Denice Bors, RN Clinical Measurements: Ability to avoid or minimize complications of infection will improve 10/19/2017 0448 - Progressing by Denice Bors, RN Skin Integrity: Skin integrity will improve 10/19/2017 0448 - Progressing by Denice Bors, RN

## 2017-10-23 ENCOUNTER — Inpatient Hospital Stay
Admission: RE | Admit: 2017-10-23 | Discharge: 2017-10-26 | DRG: 872 | Disposition: A | Discharge status: Home Health | Payer: Medicare Other | Source: Ambulatory Visit | Attending: Internal Medicine | Admitting: Internal Medicine

## 2017-10-23 ENCOUNTER — Other Ambulatory Visit: Payer: Self-pay

## 2017-10-23 DIAGNOSIS — L03115 Cellulitis of right lower limb: Secondary | ICD-10-CM | POA: Diagnosis present

## 2017-10-23 DIAGNOSIS — E785 Hyperlipidemia, unspecified: Secondary | ICD-10-CM | POA: Diagnosis present

## 2017-10-23 DIAGNOSIS — K7290 Hepatic failure, unspecified without coma: Secondary | ICD-10-CM | POA: Diagnosis present

## 2017-10-23 DIAGNOSIS — N183 Chronic kidney disease, stage 3 (moderate): Secondary | ICD-10-CM | POA: Diagnosis present

## 2017-10-23 DIAGNOSIS — E1122 Type 2 diabetes mellitus with diabetic chronic kidney disease: Secondary | ICD-10-CM | POA: Diagnosis present

## 2017-10-23 DIAGNOSIS — G4730 Sleep apnea, unspecified: Secondary | ICD-10-CM | POA: Diagnosis present

## 2017-10-23 DIAGNOSIS — F419 Anxiety disorder, unspecified: Secondary | ICD-10-CM | POA: Diagnosis present

## 2017-10-23 DIAGNOSIS — Z6841 Body Mass Index (BMI) 40.0 and over, adult: Secondary | ICD-10-CM

## 2017-10-23 DIAGNOSIS — F17290 Nicotine dependence, other tobacco product, uncomplicated: Secondary | ICD-10-CM | POA: Diagnosis present

## 2017-10-23 DIAGNOSIS — K2270 Barrett's esophagus without dysplasia: Secondary | ICD-10-CM | POA: Diagnosis present

## 2017-10-23 DIAGNOSIS — K7682 Hepatic encephalopathy: Secondary | ICD-10-CM | POA: Diagnosis present

## 2017-10-23 DIAGNOSIS — R600 Localized edema: Secondary | ICD-10-CM | POA: Diagnosis present

## 2017-10-23 DIAGNOSIS — L03116 Cellulitis of left lower limb: Secondary | ICD-10-CM | POA: Diagnosis present

## 2017-10-23 DIAGNOSIS — R410 Disorientation, unspecified: Secondary | ICD-10-CM | POA: Diagnosis present

## 2017-10-23 DIAGNOSIS — D631 Anemia in chronic kidney disease: Secondary | ICD-10-CM | POA: Diagnosis present

## 2017-10-23 DIAGNOSIS — K219 Gastro-esophageal reflux disease without esophagitis: Secondary | ICD-10-CM | POA: Diagnosis present

## 2017-10-23 DIAGNOSIS — Z79899 Other long term (current) drug therapy: Secondary | ICD-10-CM

## 2017-10-23 DIAGNOSIS — Z794 Long term (current) use of insulin: Secondary | ICD-10-CM

## 2017-10-23 DIAGNOSIS — M1990 Unspecified osteoarthritis, unspecified site: Secondary | ICD-10-CM | POA: Diagnosis present

## 2017-10-23 DIAGNOSIS — K7030 Alcoholic cirrhosis of liver without ascites: Secondary | ICD-10-CM | POA: Diagnosis present

## 2017-10-23 DIAGNOSIS — D62 Acute posthemorrhagic anemia: Secondary | ICD-10-CM | POA: Diagnosis present

## 2017-10-23 DIAGNOSIS — Z883 Allergy status to other anti-infective agents status: Secondary | ICD-10-CM | POA: Diagnosis not present

## 2017-10-23 DIAGNOSIS — A419 Sepsis, unspecified organism: Principal | ICD-10-CM | POA: Diagnosis present

## 2017-10-23 DIAGNOSIS — I129 Hypertensive chronic kidney disease with stage 1 through stage 4 chronic kidney disease, or unspecified chronic kidney disease: Secondary | ICD-10-CM | POA: Diagnosis present

## 2017-10-23 LAB — CBC
HEMATOCRIT: 22.1 % — AB (ref 40.0–52.0)
Hemoglobin: 7.4 g/dL — ABNORMAL LOW (ref 13.0–18.0)
MCH: 31.3 pg (ref 26.0–34.0)
MCHC: 33.7 g/dL (ref 32.0–36.0)
MCV: 93 fL (ref 80.0–100.0)
PLATELETS: 94 10*3/uL — AB (ref 150–440)
RBC: 2.37 MIL/uL — ABNORMAL LOW (ref 4.40–5.90)
RDW: 17.5 % — AB (ref 11.5–14.5)
WBC: 3.8 10*3/uL (ref 3.8–10.6)

## 2017-10-23 LAB — BASIC METABOLIC PANEL
ANION GAP: 5 (ref 5–15)
BUN: 25 mg/dL — ABNORMAL HIGH (ref 6–20)
CALCIUM: 8.8 mg/dL — AB (ref 8.9–10.3)
CO2: 24 mmol/L (ref 22–32)
CREATININE: 1.82 mg/dL — AB (ref 0.61–1.24)
Chloride: 113 mmol/L — ABNORMAL HIGH (ref 101–111)
GFR calc non Af Amer: 37 mL/min — ABNORMAL LOW (ref >60)
GFR, EST AFRICAN AMERICAN: 43 mL/min — AB (ref >60)
Glucose, Bld: 224 mg/dL — ABNORMAL HIGH (ref 65–99)
Potassium: 4 mmol/L (ref 3.5–5.1)
Sodium: 142 mmol/L (ref 135–145)

## 2017-10-23 LAB — HEMOGLOBIN AND HEMATOCRIT, BLOOD
HEMATOCRIT: 23.3 % — AB (ref 40.0–52.0)
HEMOGLOBIN: 7.8 g/dL — AB (ref 13.0–18.0)

## 2017-10-23 LAB — GLUCOSE, CAPILLARY
GLUCOSE-CAPILLARY: 249 mg/dL — AB (ref 65–99)
Glucose-Capillary: 216 mg/dL — ABNORMAL HIGH (ref 65–99)

## 2017-10-23 LAB — AMMONIA: Ammonia: 72 umol/L — ABNORMAL HIGH (ref 9–35)

## 2017-10-23 LAB — PREPARE RBC (CROSSMATCH)

## 2017-10-23 MED ORDER — SODIUM CHLORIDE 0.9 % IV SOLN
Freq: Once | INTRAVENOUS | Status: AC
  Administered 2017-10-23: 13:00:00 via INTRAVENOUS

## 2017-10-23 MED ORDER — SODIUM CHLORIDE FLUSH 0.9 % IV SOLN
INTRAVENOUS | Status: AC
  Administered 2017-10-23: 17:00:00
  Filled 2017-10-23: qty 10

## 2017-10-23 MED ORDER — ONDANSETRON HCL 4 MG PO TABS: 4.0000 mg | ORAL_TABLET | Freq: Four times a day (QID) | ORAL | Status: DC | PRN

## 2017-10-23 MED ORDER — SODIUM CHLORIDE 0.9% FLUSH
3.0000 mL | Freq: Two times a day (BID) | INTRAVENOUS | Status: DC
  Administered 2017-10-23 – 2017-10-25 (×3): 3 mL via INTRAVENOUS

## 2017-10-23 MED ORDER — ENOXAPARIN SODIUM 40 MG/0.4ML ~~LOC~~ SOLN
40.0000 mg | Freq: Two times a day (BID) | SUBCUTANEOUS | Status: DC
  Administered 2017-10-23 – 2017-10-25 (×5): 40 mg via SUBCUTANEOUS
  Filled 2017-10-23 (×5): qty 0.4

## 2017-10-23 MED ORDER — LACTULOSE 10 GM/15ML PO SOLN
30.0000 g | Freq: Three times a day (TID) | ORAL | Status: DC
  Administered 2017-10-23 – 2017-10-24 (×4): 30 g via ORAL
  Administered 2017-10-24: 20 g via ORAL
  Administered 2017-10-25 (×3): 30 g via ORAL
  Filled 2017-10-23 (×6): qty 60
  Filled 2017-10-23: qty 45
  Filled 2017-10-23 (×2): qty 60
  Filled 2017-10-23 (×2): qty 45

## 2017-10-23 MED ORDER — INSULIN ASPART 100 UNIT/ML ~~LOC~~ SOLN
0.0000 [IU] | Freq: Three times a day (TID) | SUBCUTANEOUS | Status: DC
  Administered 2017-10-23: 18:00:00 3 [IU] via SUBCUTANEOUS
  Administered 2017-10-24: 2 [IU] via SUBCUTANEOUS
  Administered 2017-10-24: 18:00:00 1 [IU] via SUBCUTANEOUS
  Administered 2017-10-24 – 2017-10-25 (×3): 2 [IU] via SUBCUTANEOUS
  Administered 2017-10-25: 1 [IU] via SUBCUTANEOUS
  Filled 2017-10-23 (×8): qty 1

## 2017-10-23 MED ORDER — RIFAXIMIN 550 MG PO TABS
550.0000 mg | ORAL_TABLET | Freq: Two times a day (BID) | ORAL | Status: DC
  Administered 2017-10-23 – 2017-10-25 (×5): 550 mg via ORAL
  Filled 2017-10-23 (×8): qty 1

## 2017-10-23 MED ORDER — INSULIN ASPART PROT & ASPART (70-30 MIX) 100 UNIT/ML ~~LOC~~ SUSP
84.0000 [IU] | Freq: Every day | SUBCUTANEOUS | Status: DC
  Administered 2017-10-24: 09:00:00 84 [IU] via SUBCUTANEOUS
  Filled 2017-10-23 (×2): qty 1

## 2017-10-23 MED ORDER — ACETAMINOPHEN 650 MG RE SUPP
650.0000 mg | Freq: Four times a day (QID) | RECTAL | Status: DC | PRN
  Filled 2017-10-23: qty 1

## 2017-10-23 MED ORDER — PANTOPRAZOLE SODIUM 40 MG PO TBEC
40.0000 mg | DELAYED_RELEASE_TABLET | Freq: Every day | ORAL | Status: DC
  Administered 2017-10-23 – 2017-10-25 (×3): 40 mg via ORAL
  Filled 2017-10-23 (×3): qty 1

## 2017-10-23 MED ORDER — INSULIN ASPART PROT & ASPART (70-30 MIX) 100 UNIT/ML ~~LOC~~ SUSP
62.0000 [IU] | Freq: Every day | SUBCUTANEOUS | Status: DC
  Administered 2017-10-23 – 2017-10-24 (×2): 62 [IU] via SUBCUTANEOUS
  Filled 2017-10-23 (×2): qty 1

## 2017-10-23 MED ORDER — POLYETHYLENE GLYCOL 3350 17 G PO PACK: 17.0000 g | PACK | Freq: Every day | ORAL | Status: DC | PRN

## 2017-10-23 MED ORDER — SODIUM CHLORIDE 0.9 % IV SOLN: 250.0000 mL | INTRAVENOUS | Status: DC | PRN

## 2017-10-23 MED ORDER — FERROUS SULFATE 325 (65 FE) MG PO TABS
325.0000 mg | ORAL_TABLET | Freq: Two times a day (BID) | ORAL | Status: DC
  Administered 2017-10-23 – 2017-10-25 (×5): 325 mg via ORAL
  Filled 2017-10-23 (×6): qty 1

## 2017-10-23 MED ORDER — SODIUM CHLORIDE 0.9% FLUSH: 3.0000 mL | INTRAVENOUS | Status: DC | PRN

## 2017-10-23 MED ORDER — LORAZEPAM 0.5 MG PO TABS
0.5000 mg | ORAL_TABLET | Freq: Three times a day (TID) | ORAL | Status: DC | PRN
Start: 2017-10-23 — End: 2017-10-24
  Administered 2017-10-24: 0.5 mg via ORAL
  Filled 2017-10-23: qty 1

## 2017-10-23 MED ORDER — ACETAMINOPHEN 325 MG PO TABS
650.0000 mg | ORAL_TABLET | Freq: Four times a day (QID) | ORAL | Status: DC | PRN
  Administered 2017-10-24: 06:00:00 650 mg via ORAL
  Filled 2017-10-23: qty 2

## 2017-10-23 MED ORDER — ONDANSETRON HCL 4 MG/2ML IJ SOLN: 4.0000 mg | Freq: Four times a day (QID) | INTRAMUSCULAR | Status: DC | PRN

## 2017-10-23 MED ORDER — GERHARDT'S BUTT CREAM
1.0000 | TOPICAL_CREAM | Freq: Two times a day (BID) | CUTANEOUS | Status: DC
Start: 2017-10-23 — End: 2017-10-26
  Administered 2017-10-23 – 2017-10-26 (×7): 1 via TOPICAL
  Filled 2017-10-23: qty 1

## 2017-10-23 MED ORDER — ADULT MULTIVITAMIN W/MINERALS CH
1.0000 | ORAL_TABLET | Freq: Every day | ORAL | Status: DC
  Administered 2017-10-23 – 2017-10-25 (×3): 1 via ORAL
  Filled 2017-10-23 (×3): qty 1

## 2017-10-23 MED ORDER — SPIRONOLACTONE 25 MG PO TABS
25.0000 mg | ORAL_TABLET | Freq: Every day | ORAL | Status: DC
  Administered 2017-10-23 – 2017-10-25 (×3): 25 mg via ORAL
  Filled 2017-10-23 (×2): qty 1

## 2017-10-23 MED ORDER — FUROSEMIDE 40 MG PO TABS
40.0000 mg | ORAL_TABLET | Freq: Two times a day (BID) | ORAL | Status: DC
  Administered 2017-10-23 – 2017-10-25 (×5): 40 mg via ORAL
  Filled 2017-10-23 (×5): qty 1

## 2017-10-23 NOTE — Progress Notes (Signed)
Pt transferred from PACU. Pt confused, but oriented to self and time. When answering questions it is unclear if he is joking or confused due to his tone, and some of the answers given. When asked how tall he was he stated he was "eight feet tall".

## 2017-10-23 NOTE — H&P (Signed)
[place] [facility] [place] NAME: Corey West    MR#:  [id number]  DATE OF BIRTH:  November 28, 1950  DATE OF ADMISSION:  10/23/2017  PRIMARY CARE PHYSICIAN: Baxter Hire, MD   REQUESTING/REFERRING PHYSICIAN: Dr Edwina Barth  CHIEF COMPLAINT:  confusion HISTORY OF PRESENT ILLNESS:  Corey West  is a 67 y.o. male with a known history of chronic kidney disease stage III, liver cirrhosis, anemia of chronic kidney disease and diabetes who presented as a direct admit for confusion. Patient was seen by his PCP yesterday. He was noted have low hemoglobin. He was sent to same-day surgery for blood transfusion where he was noted to be very confused. He received 1 unit of PRBCs. Ammonia level was checked and was elevated.  Patient reports that he only takes his medications at night. He was instructed by myself during last admission that he must be compliant with his medications and take them exactly as directed.  PAST MEDICAL HISTORY:   Past Medical History:  Diagnosis Date  . Anemia   . Anxiety    controlled;   . Arthritis   . AVM (arteriovenous malformation) of stomach, acquired with hemorrhage   . Barrett's esophagus   . Chronic kidney disease    renal infufficiency  . Cirrhosis ([place])   . Depression    controlled;   Marland Kitchen Diabetes mellitus without complication ([place])    not controlled, taking insulin but sugar continues to run high;   . Edema   . Esophageal varices ([place])   . GERD (gastroesophageal reflux disease)   . History of hiatal hernia   . Hyperlipidemia   . Hypertension    controlled well;   . Nephropathy, diabetic ([place])   . Obesity   . Pancytopenia ([place])   . Polyp, stomach    with chronic blood loss  . Sleep apnea    does not wear a cpap, Medicare would not pay for it   . Venous stasis dermatitis of both lower extremities   . Venous stasis of both lower extremities    with cellulitis    PAST SURGICAL HISTORY:   Past Surgical History:   Procedure Laterality Date  . ESOPHAGOGASTRODUODENOSCOPY N/A 02/09/2015   Procedure: ESOPHAGOGASTRODUODENOSCOPY (EGD);  Surgeon: Manya Silvas, MD;  Location: [facility] ENDOSCOPY;  Service: Endoscopy;  Laterality: N/A;  . ESOPHAGOGASTRODUODENOSCOPY N/A 07/22/2015   Procedure: ESOPHAGOGASTRODUODENOSCOPY (EGD);  Surgeon: Manya Silvas, MD;  Location: [facility] ENDOSCOPY;  Service: Endoscopy;  Laterality: N/A;  . ESOPHAGOGASTRODUODENOSCOPY N/A 06/26/2017   Procedure: ESOPHAGOGASTRODUODENOSCOPY (EGD);  Surgeon: Manya Silvas, MD;  Location: [facility] ENDOSCOPY;  Service: Endoscopy;  Laterality: N/A;  . ESOPHAGOGASTRODUODENOSCOPY N/A 06/27/2017   Procedure: ESOPHAGOGASTRODUODENOSCOPY (EGD);  Surgeon: Manya Silvas, MD;  Location: [facility] ENDOSCOPY;  Service: Endoscopy;  Laterality: N/A;  . ESOPHAGOGASTRODUODENOSCOPY N/A 06/28/2017   Procedure: ESOPHAGOGASTRODUODENOSCOPY (EGD);  Surgeon: Manya Silvas, MD;  Location: [facility] ENDOSCOPY;  Service: Endoscopy;  Laterality: N/A;  . ESOPHAGOGASTRODUODENOSCOPY Bilateral 10/11/2017   Procedure: ESOPHAGOGASTRODUODENOSCOPY (EGD);  Surgeon: Manya Silvas, MD;  Location: [facility] ENDOSCOPY;  Service: Endoscopy;  Laterality: Bilateral;  . ESOPHAGOGASTRODUODENOSCOPY (EGD) WITH PROPOFOL N/A 07/20/2015   Procedure: ESOPHAGOGASTRODUODENOSCOPY (EGD) WITH PROPOFOL;  Surgeon: Manya Silvas, MD;  Location: [facility] ENDOSCOPY;  Service: Endoscopy;  Laterality: N/A;  . ESOPHAGOGASTRODUODENOSCOPY (EGD) WITH PROPOFOL N/A 09/16/2015   Procedure: ESOPHAGOGASTRODUODENOSCOPY (EGD) WITH PROPOFOL;  Surgeon: Manya Silvas, MD;  Location: [facility] ENDOSCOPY;  Service: Endoscopy;  Laterality: N/A;  . ESOPHAGOGASTRODUODENOSCOPY (EGD) WITH PROPOFOL  N/A 03/16/2016   Procedure: ESOPHAGOGASTRODUODENOSCOPY (EGD) WITH PROPOFOL;  Surgeon: Manya Silvas, MD;  Location: [facility] ENDOSCOPY;  Service: Endoscopy;  Laterality: N/A;  . ESOPHAGOGASTRODUODENOSCOPY (EGD) WITH PROPOFOL N/A 09/14/2016   Procedure:  ESOPHAGOGASTRODUODENOSCOPY (EGD) WITH PROPOFOL;  Surgeon: Manya Silvas, MD;  Location: [facility] ENDOSCOPY;  Service: Endoscopy;  Laterality: N/A;  . ESOPHAGOGASTRODUODENOSCOPY (EGD) WITH PROPOFOL N/A 11/05/2016   Procedure: ESOPHAGOGASTRODUODENOSCOPY (EGD) WITH PROPOFOL;  Surgeon: Manya Silvas, MD;  Location: [facility] ENDOSCOPY;  Service: Endoscopy;  Laterality: N/A;  . ESOPHAGOGASTRODUODENOSCOPY (EGD) WITH PROPOFOL N/A 02/06/2017   Procedure: ESOPHAGOGASTRODUODENOSCOPY (EGD) WITH PROPOFOL;  Surgeon: Manya Silvas, MD;  Location: [facility] ENDOSCOPY;  Service: Endoscopy;  Laterality: N/A;  . ESOPHAGOGASTRODUODENOSCOPY (EGD) WITH PROPOFOL N/A 04/24/2017   Procedure: ESOPHAGOGASTRODUODENOSCOPY (EGD) WITH PROPOFOL;  Surgeon: Manya Silvas, MD;  Location: [facility] ENDOSCOPY;  Service: Endoscopy;  Laterality: N/A;  . ESOPHAGOGASTRODUODENOSCOPY (EGD) WITH PROPOFOL N/A 07/31/2017   Procedure: ESOPHAGOGASTRODUODENOSCOPY (EGD) WITH PROPOFOL;  Surgeon: Manya Silvas, MD;  Location: [facility] ENDOSCOPY;  Service: Endoscopy;  Laterality: N/A;  . TONSILLECTOMY    . TONSILLECTOMY AND ADENOIDECTOMY    . ULNAR NERVE TRANSPOSITION    . UVULOPALATOPHARYNGOPLASTY      SOCIAL HISTORY:   Social History   Tobacco Use  . Smoking status: Current Some Day Smoker    Types: Cigars  . Smokeless tobacco: Never Used  . Tobacco comment: smoke 1 at night occasionally;   Substance Use Topics  . Alcohol use: No    Comment: stopped 12 years ago    FAMILY HISTORY:   Family History  Problem Relation Age of Onset  . Diabetes Other     DRUG ALLERGIES:  No Known Allergies  REVIEW OF SYSTEMS:   Review of Systems  Constitutional: Positive for malaise/fatigue. Negative for chills and fever.  HENT: Negative.  Negative for ear discharge, ear pain, hearing loss, nosebleeds and sore throat.   Eyes: Negative.  Negative for blurred vision and pain.  Respiratory: Negative.  Negative for cough, hemoptysis, shortness of breath  and wheezing.   Cardiovascular: Negative.  Negative for chest pain, palpitations and leg swelling.  Gastrointestinal: Negative.  Negative for abdominal pain, blood in stool, diarrhea, nausea and vomiting.  Genitourinary: Negative.  Negative for dysuria.  Musculoskeletal: Negative.  Negative for back pain.  Skin: Negative.   Neurological: Positive for weakness. Negative for dizziness, tremors, speech change, focal weakness, seizures and headaches.  Endo/Heme/Allergies: Negative.  Does not bruise/bleed easily.  Psychiatric/Behavioral: Positive for memory loss. Negative for depression, hallucinations and suicidal ideas.    MEDICATIONS AT HOME:   Prior to Admission medications   Medication Sig Start Date End Date Taking? Authorizing Provider  FERROUS SULFATE ER PO Take 325 mg by mouth 2 (two) times daily.    Yes [provider]  furosemide (LASIX) 40 MG tablet Take 40 mg by mouth 2 (two) times daily.   Yes [provider]  insulin aspart protamine- aspart (NOVOLOG MIX 70/30) (70-30) 100 UNIT/ML injection Inject 0.65-0.84 mLs (65-84 Units total) into the skin 2 (two) times daily with a meal. Pt uses 84 units with breakfast and 62 units with supper. 10/19/17  Yes Anselm Aumiller, Ulice Bold, MD  lactulose (CHRONULAC) 10 GM/15ML solution Take 30 g by mouth 3 (three) times daily.    Yes [provider]  LORazepam (ATIVAN) 0.5 MG tablet Take 0.5 mg by mouth 3 (three) times daily as needed for anxiety.    Yes [provider]  Multiple Vitamin (MULTIVITAMIN) tablet  Take 1 tablet by mouth daily.   Yes [provider]  omeprazole (PRILOSEC) 40 MG capsule Take 40 mg by mouth 2 (two) times daily.   Yes [provider]  potassium chloride SA (K-DUR,KLOR-CON) 20 MEQ tablet Take 1 tablet (20 mEq total) by mouth daily. Take BID for 2 days after  That resume once aday 10/19/17 01/17/18 Yes Kash Davie, MD  rifaximin (XIFAXAN) 550 MG TABS tablet Take 550 mg by mouth 2 (two) times  daily.   Yes [provider]  spironolactone (ALDACTONE) 25 MG tablet Take 25 mg by mouth daily.   Yes [provider]  traMADol (ULTRAM) 50 MG tablet Take 50 mg by mouth every 8 (eight) hours as needed.    Yes [provider]  Hydrocortisone (GERHARDT'S BUTT CREAM) CREA Apply 1 application topically 2 (two) times daily. 09/05/17   Gouru, Illene Silver, MD      VITAL SIGNS:  Blood pressure (!) 122/42, pulse 78, temperature 97.9 F (36.6 C), temperature source Temporal, resp. rate 14, height 5\' 9"  (1.753 m), weight (!) 140.6 kg (310 lb), SpO2 100 %.  PHYSICAL EXAMINATION:   Physical Exam  Constitutional: No distress.  Morbidly obese  HENT:  Head: Normocephalic.  Eyes: No scleral icterus.  Neck: Normal range of motion. Neck supple. No JVD present. No tracheal deviation present.  Cardiovascular: Normal rate, regular rhythm and normal heart sounds. Exam reveals no gallop and no friction rub.  No murmur heard. Distant heart sounds  Pulmonary/Chest: Effort normal and breath sounds normal. No respiratory distress. He has no wheezes. He has no rales. He exhibits no tenderness.  Abdominal: Soft. Bowel sounds are normal. He exhibits no distension and no mass. There is no tenderness. There is no rebound and no guarding.  Musculoskeletal: Normal range of motion. He exhibits edema.  Lower extremities are wrapped  Neurological: He is alert.  Skin: Skin is warm. No rash noted. No erythema.  Psychiatric:  confused      LABORATORY PANEL:   CBC Recent Labs  Lab 10/18/17 0501 10/23/17 1118  WBC 4.8  --   HGB 7.5* 7.8*  HCT 22.4* 23.3*  PLT COUNT MAY BE INACCURATE DUE TO FIBRIN CLUMPS.  --    ------------------------------------------------------------------------------------------------------------------  Chemistries  Recent Labs  Lab 10/17/17 1801  10/19/17 0721  NA 140   < > 139  K 3.3*   < > 3.4*  CL 108   < > 111  CO2 22   < > 21*  GLUCOSE 106*   < > 204*   BUN 31*   < > 26*  CREATININE 1.86*   < > 1.80*  CALCIUM 9.0   < > 8.2*  AST 38  --   --   ALT 24  --   --   ALKPHOS 77  --   --   BILITOT 0.8  --   --    < > = values in this interval not displayed.   ------------------------------------------------------------------------------------------------------------------  Cardiac Enzymes Recent Labs  Lab 10/17/17 1938  TROPONINI <0.03   ------------------------------------------------------------------------------------------------------------------  RADIOLOGY:  No results found.  EKG:   Orders placed or performed during the hospital encounter of 10/17/17  . EKG 12-Lead  . EKG 12-Lead  . ED EKG  . ED EKG  . EKG    IMPRESSION AND PLAN:   67 year old male with history of diabetes and EtOH related liver cirrhosis who was directly admitted for confusion.  1. Acute hepatic encephalopathy: Ammonia level is elevated which  correlates with hepatic encephalopathy. Continue lactulose and Xifaxan Ammonia level for a.m.   2. Acute on chronic blood loss anemia with history of Barrett's/AVM and iron deficiency anemia and recent EGD in early January which did not show evidence of bleeding or clots Patient 1 unit PRBCs today and same day surgery Repeat CBC in a.m.  3 Liver cirrhosis: Patient will continue on Lasix and Aldactone   4. Chronic kidney disease stage III: Labs will be ordered  5. Diabetes: SSI ordered Continue NPH  6. Tobacco dependence: Patient is encouraged to quit smoking. Counseling was provided for 4 minutes  7. Chronic lower extremity edema: Wound care consultation requested  Patient will need physical therapy and case management for discharge planning    All the records are reviewed and case discussed with ED provider. Management plans discussed with the patient and he in agreement  CODE STATUS: FULL  TOTAL TIME TAKING CARE OF THIS PATIENT: 45 minutes.    Jordayn Mink M.D on 10/23/2017 at 2:05  PM  Between 7am to 6pm - Pager - [phone]  After 6pm go to [email or web address] - password [email or web address] [place] [facility]  Office  [phone]  CC: Primary care physician; Baxter Hire, MD

## 2017-10-23 NOTE — OR Nursing (Addendum)
Patient presents for blood transfusion. This RN and Control and instrumentation engineer was in room with patient and patient stated, "he brought 4 cans of food so that he could be admitted." Patient stated to this RN and Art therapist that "my wife cooked me a terrible breakfast" then stated "my wife didn't make me any breakfast." Patient then started to talk about his girlfriend and his cell phone rang and he said it was his girlfriend and maybe her boy friend found out about them. Patient stated to this RN and Rebbeca Paul that he drove himself here and that he brought the canned food at good will. Patient thinks that today is Sunday and Mady Gemma is president. This patient believes that he has not been to this department before but he is a frequent patient here and knows nurses by name. Patient also asked NT for a beer to drink. Patient does not usually drink.   Patient also drifts off to sleep easily.

## 2017-10-23 NOTE — OR Nursing (Signed)
Notified Dr Edwina Barth of patients ammonia level of 72. No new orders at this time.

## 2017-10-23 NOTE — Progress Notes (Signed)
Pharmacist - Prescriber Communication  Enoxaparin dose modified from 40 mg subcutaneously once daily to 40 mg subcutaneously BID d/t BMI > 40.  Gerry Blanchfield A. [place], [place].D., BCPS Clinical Pharmacist 10/23/17 17:02

## 2017-10-23 NOTE — Consult Note (Signed)
Patient very well known to me with hx of cirrhosis, ascites, recurrent UGI bleeding, recurrent anemia with need for recurrent transfusions.  No evidence of variceal bleeding.  Last EGD no fresh blood or active bleeding seen.  He has spells of confusion which are often more impressive than his ammonia level would suggest.  I came by to see him but he is asleep and I will leave him alone and see him tomorrow afternoon.  Given his relatively unremarkable last EGD will see how he does over night.  May or may not repeat EGD.

## 2017-10-24 LAB — CBC
HEMATOCRIT: 23.6 % — AB (ref 40.0–52.0)
Hemoglobin: 7.8 g/dL — ABNORMAL LOW (ref 13.0–18.0)
MCH: 31.1 pg (ref 26.0–34.0)
MCHC: 33.3 g/dL (ref 32.0–36.0)
MCV: 93.6 fL (ref 80.0–100.0)
Platelets: 105 10*3/uL — ABNORMAL LOW (ref 150–440)
RBC: 2.52 MIL/uL — ABNORMAL LOW (ref 4.40–5.90)
RDW: 18 % — ABNORMAL HIGH (ref 11.5–14.5)
WBC: 10.5 10*3/uL (ref 3.8–10.6)

## 2017-10-24 LAB — LACTIC ACID, PLASMA
Lactic Acid, Venous: 1.6 mmol/L (ref 0.5–1.9)
Lactic Acid, Venous: 1.9 mmol/L (ref 0.5–1.9)

## 2017-10-24 LAB — GLUCOSE, CAPILLARY
GLUCOSE-CAPILLARY: 139 mg/dL — AB (ref 65–99)
Glucose-Capillary: 189 mg/dL — ABNORMAL HIGH (ref 65–99)
Glucose-Capillary: 130 mg/dL — ABNORMAL HIGH (ref 65–99)
Glucose-Capillary: 96 mg/dL (ref 65–99)

## 2017-10-24 LAB — COMPREHENSIVE METABOLIC PANEL
ALBUMIN: 2.7 g/dL — AB (ref 3.5–5.0)
ALT: 26 U/L (ref 17–63)
ANION GAP: 6 (ref 5–15)
AST: 37 U/L (ref 15–41)
Alkaline Phosphatase: 80 U/L (ref 38–126)
BILIRUBIN TOTAL: 1.1 mg/dL (ref 0.3–1.2)
BUN: 23 mg/dL — AB (ref 6–20)
CHLORIDE: 114 mmol/L — AB (ref 101–111)
CO2: 22 mmol/L (ref 22–32)
Calcium: 9.2 mg/dL (ref 8.9–10.3)
Creatinine, Ser: 1.73 mg/dL — ABNORMAL HIGH (ref 0.61–1.24)
GFR calc Af Amer: 46 mL/min — ABNORMAL LOW (ref >60)
GFR calc non Af Amer: 40 mL/min — ABNORMAL LOW (ref >60)
GLUCOSE: 190 mg/dL — AB (ref 65–99)
POTASSIUM: 3.8 mmol/L (ref 3.5–5.1)
SODIUM: 142 mmol/L (ref 135–145)
TOTAL PROTEIN: 6.2 g/dL — AB (ref 6.5–8.1)

## 2017-10-24 LAB — TYPE AND SCREEN
ABO/RH(D): A NEG
ANTIBODY SCREEN: NEGATIVE
Unit division: 0

## 2017-10-24 LAB — BPAM RBC
BLOOD PRODUCT EXPIRATION DATE: 201902042359
ISSUE DATE / TIME: 201901231252
UNIT TYPE AND RH: 600

## 2017-10-24 LAB — AMMONIA: AMMONIA: 71 umol/L — AB (ref 9–35)

## 2017-10-24 MED ORDER — SODIUM CHLORIDE 0.9 % IV SOLN
INTRAVENOUS | Status: AC
  Administered 2017-10-24 (×2): via INTRAVENOUS

## 2017-10-24 MED ORDER — CEFEPIME HCL 2 G IJ SOLR
2.0000 g | Freq: Three times a day (TID) | INTRAMUSCULAR | Status: DC
  Administered 2017-10-24 – 2017-10-25 (×3): 2 g via INTRAVENOUS
  Filled 2017-10-24 (×5): qty 2

## 2017-10-24 MED ORDER — CEFEPIME HCL 1 G IJ SOLR
500.0000 mg | Freq: Three times a day (TID) | INTRAMUSCULAR | Status: DC
  Filled 2017-10-24 (×2): qty 0.5

## 2017-10-24 NOTE — Progress Notes (Signed)
PT Cancellation Note  Patient Details Name: Corey West MRN: [id number] DOB: 1951/01/21   Cancelled Treatment:     Per pt's physician, pt has a fever today is "not himself".  PT advised to hold evaluation until tomorrow.  PT will re-attempt evaluation on 10/25/2017.   Roxanne Gates, PT, DPT 10/24/2017, 1:21 PM

## 2017-10-24 NOTE — Consult Note (Signed)
[place] Nurse wound consult note Reason for Consult:Patient with chronic venous insufficiency and lymphedema.  Wears Juxtalite compression but cannot don and doff himself and lives alone and has suboptimal compliance.  Blistering present to right anterior lower leg. HAs Juxtalite in place at this time.  Wound type:venous insufficiency Pressure Injury POA: Yes/ Measurement: scattered (3) 0.2 cm serum filled blisters to right lower leg with generalized edema to both legs Wound ZMO:QHUTM filled blisters Drainage (amount, consistency, odor)  Minimal serous weeping Periwound:edema erythema Dressing procedure/placement/frequency: CLeanse bilateral legs with soap and water.  Moisturize with barrier cream.  Apply removable compression.  Refuses Unna boots.  Will not follow at this time.  Please re-consult if needed.  Domenic Moras RN BSN [place] Pager [phone]

## 2017-10-24 NOTE — Consult Note (Signed)
Patient asleep, awoken by me, temp of 103 this morning, 101 at 8 am.  Possible sources of fever are skin infections in legs, spont bacterial peritonitis, I doubt pneumonia as O2 sat good. Spoke briefly with Hospitalist about him, they will take appropriate action.

## 2017-10-24 NOTE — Progress Notes (Signed)
[place] at [place] NAME: Corey West    MR#:  [id number]  DATE OF BIRTH:  04-18-1951  SUBJECTIVE:  Patient quite lethargic although awakens and answers few questions and falls back to sleep.  He has been having high-grade fever since early hours of the morning.  T-max 103.  Blood pressure stable.  No family in the room.  REVIEW OF SYSTEMS:   Review of Systems  Unable to perform ROS: Mental acuity   Tolerating Diet: Yes Tolerating PT: Pending  DRUG ALLERGIES:  No Known Allergies  VITALS:  Blood pressure (!) 125/45, pulse 85, temperature 99.3 F (37.4 C), temperature source Oral, resp. rate 20, height 5\' 9"  (1.753 m), weight (!) 145.3 kg (320 lb 6.4 oz), SpO2 100 %.  PHYSICAL EXAMINATION:   Physical Exam  GENERAL:  67 y.o.-year-old patient lying in the bed with no acute distress.  Morbid obesity EYES: Pupils equal, round, reactive to light and accommodation. No scleral icterus. Extraocular muscles intact.  HEENT: Head atraumatic, normocephalic. Oropharynx and nasopharynx clear.  NECK:  Supple, no jugular venous distention. No thyroid enlargement, no tenderness.  LUNGS: Normal breath sounds bilaterally, no wheezing, rales, rhonchi. No use of accessory muscles of respiration.  CARDIOVASCULAR: S1, S2 normal. No murmurs, rubs, or gallops.  ABDOMEN: Soft, nontender, nondistended. Bowel sounds present. No organomegaly or mass.  EXTREMITIES: Bilateral lower extremity chronic venous congestion with compression wrap.  Patient appears to have cellulitis bilaterally more in the right than the left lower extremity NEUROLOGIC: Nonfocal.  Limited exam secondary to pathology.  No tremors. PSYCHIATRIC:  patient is lethargic awakens and answers questions some of the SKIN: Poor hygiene bilateral lower extremity feet dry skin  LABORATORY PANEL:  CBC Recent Labs  Lab 10/24/17 0654  WBC 10.5  HGB 7.8*  HCT 23.6*  PLT 105*    Chemistries   Recent Labs  Lab 10/24/17 0654  NA 142  K 3.8  CL 114*  CO2 22  GLUCOSE 190*  BUN 23*  CREATININE 1.73*  CALCIUM 9.2  AST 37  ALT 26  ALKPHOS 80  BILITOT 1.1   Cardiac Enzymes Recent Labs  Lab 10/17/17 1938  TROPONINI <0.03   RADIOLOGY:  No results found. ASSESSMENT AND PLAN:  67 year old male with history of diabetes and EtOH related liver cirrhosis who was directly admitted for confusion.  1. sepsis suspected secondary to bilateral lower extremity cellulitis more on the left than right.  Cannot rule out peritonitis.  Although clinically patient does not appear to have abdominal pain or ascites. -Start on IV cefepime -Follow blood culture and urine culture -Chest x-ray negative -Monitor fever curve -Consider abdominal CT scan if fever does not subside   2. Acute on chronic blood loss anemia with history of Barrett's/AVM and iron deficiency anemia and recent EGD in early January which did not show evidence of bleeding or clots Patient received 1 unit PRBCs today and same day surgery Hemoglobin 7.8  3 chronic Liver cirrhosis: Patient will continue on Lasix and Aldactone  4. Acute hepatic encephalopathy  Ammonia level is elevated which correlates with hepatic encephalopathy. Came in with ammonia of 72----71 Continue lactulose and Xifaxan Ammonia level for a.m.  5. Diabetes: SSI ordered Continue NPH  6. Chronic kidney disease stage III creatinine 1.8 to--- 1.73   7. Chronic lower extremity edema: Wound care consultation requested  Patient will need physical therapy and case management for discharge planning    Case discussed with Care  Management/Social Worker. Management plans discussed with the patient, family and they are in agreement.  CODE STATUS: Full DVT Prophylaxis: Avoid DVT prophylaxis secondary to low platelets, chronic due to liver cirrhosis  TOTAL TIME TAKING CARE OF THIS PATIENT: 30* minutes.  >50% time spent on counselling and  coordination of care  POSSIBLE D/C IN few DAYS, DEPENDING ON CLINICAL CONDITION.  Note: This dictation was prepared with Dragon dictation along with smaller phrase technology. Any transcriptional errors that result from this process are unintentional.  Fritzi Mandes M.D on 10/24/2017 at 7:12 PM  Between 7am to 6pm - Pager - [phone]  After 6pm go to [email or web address] - password [email or web address] [place] [facility]  Office  [phone]  CC: Primary care physician; Baxter Hire, MDPatient ID: Corey West, male   DOB: 03-02-1951, 67 y.o.   MRN: [id number]

## 2017-10-25 LAB — GLUCOSE, CAPILLARY
GLUCOSE-CAPILLARY: 158 mg/dL — AB (ref 65–99)
Glucose-Capillary: 126 mg/dL — ABNORMAL HIGH (ref 65–99)
Glucose-Capillary: 198 mg/dL — ABNORMAL HIGH (ref 65–99)
Glucose-Capillary: 204 mg/dL — ABNORMAL HIGH (ref 65–99)

## 2017-10-25 LAB — PREPARE RBC (CROSSMATCH)

## 2017-10-25 MED ORDER — INSULIN DETEMIR 100 UNIT/ML ~~LOC~~ SOLN
25.0000 [IU] | Freq: Two times a day (BID) | SUBCUTANEOUS | Status: DC
  Administered 2017-10-25 (×2): 25 [IU] via SUBCUTANEOUS
  Filled 2017-10-25 (×4): qty 0.25

## 2017-10-25 MED ORDER — SODIUM CHLORIDE 0.9 % IV SOLN
Freq: Once | INTRAVENOUS | Status: AC
  Administered 2017-10-25: 15:00:00 via INTRAVENOUS

## 2017-10-25 MED ORDER — LORAZEPAM 0.5 MG PO TABS: 0.5000 mg | ORAL_TABLET | Freq: Three times a day (TID) | ORAL | Status: DC | PRN

## 2017-10-25 MED ORDER — DEXTROSE 5 % IV SOLN
1.0000 g | Freq: Three times a day (TID) | INTRAVENOUS | Status: DC
  Administered 2017-10-25 – 2017-10-26 (×3): 1 g via INTRAVENOUS
  Filled 2017-10-25 (×5): qty 10

## 2017-10-25 MED ORDER — CEFAZOLIN SODIUM-DEXTROSE 1-4 GM/50ML-% IV SOLN
1.0000 g | Freq: Three times a day (TID) | INTRAVENOUS | Status: DC
  Filled 2017-10-25 (×2): qty 50

## 2017-10-25 NOTE — Progress Notes (Signed)
Inpatient Diabetes Program Recommendations  AACE/ADA: New Consensus Statement on Inpatient Glycemic Control (2015)  Target Ranges:  Prepandial:   less than 140 mg/dL      Peak postprandial:   less than 180 mg/dL (1-2 hours)      Critically ill patients:  140 - 180 mg/dL   Lab Results  Component Value Date   GLUCAP 126 (H) 10/25/2017   HGBA1C 7.0 (H) 09/04/2017    Review of Glycemic Control  Results for Corey West, Corey West (MRN [id number]) as of 10/25/2017 09:17  Ref. Range 10/24/2017 07:35 10/24/2017 11:40 10/24/2017 17:00 10/24/2017 20:22 10/25/2017 07:41  Glucose-Capillary Latest Ref Range: 65 - 99 mg/dL 189 (H) 139 (H) 130 (H) 96 126 (H)   Diabetes history:Type 2 Outpatient Diabetes medications:Novolog mix 70/30 84 units qam, Novolog 70/30 65units qsupper  Current orders for Inpatient glycemic control: Novolog mix 70/30 84 units qam, Novolog 70/30 62units q supper, Novolog 0-9 units tid  Inpatient Diabetes Program Recommendations:Oral intake poor- please d/c 70/30 insulin bid and consider starting  Levemir 25 units bid (1/2 home dose), Novolog 5 units tid (if he eats more than 50%), continue Novolog correction 0-9 units tid.  Consider adding Novolog 0-5 units qhs   Text paged Dr. Fritzi Mandes and discussed with RN   Gentry Fitz, RN, BA, MHA, CDE Diabetes Coordinator Inpatient Diabetes Program  [phone] (Team Pager) [phone] ([place]) 10/25/2017 9:22 AM

## 2017-10-25 NOTE — Evaluation (Signed)
Physical Therapy Evaluation Patient Details Name: Corey West MRN: [id number] DOB: [birth year] Today's Date: 10/25/2017   History of Present Illness  Pt is a 67 y.o. male presenting as direct admit with confusion; s/p 1 unit PRBC's.  Pt admitted with acute hepatic encephalopathy, acute on chronic blood loss anemia, sepsis suspected secondary to B LE cellulitis L>R.  Pt with blisters R lower leg (serum filled).  PMH includes CKD, liver cirrhosis, anemia, AVM of stomache, Barrett's esophagus, DM, htn, venous stasis B LE's.  Clinical Impression  Prior to hospital admission, pt was modified independent ambulating with [facility] and working part time.  Pt lives alone in 1 level home with 3-4 steps to enter with railing.  Currently pt is modified independent supine to sit and CGA to SBA with transfers and ambulation 160 feet with SPC.  Overall pt with generalized weakness; no overt loss of balance noted during session.  Pt would benefit from skilled PT to address noted impairments and functional limitations during hospitalization (see below for any additional details).  Upon hospital discharge, recommend pt discharge to home; no further PT needs anticipated upon hospital discharge.    Follow Up Recommendations No PT follow up    Equipment Recommendations  Cane(pt already owns [employer] [facility])    Recommendations for Other Services       Precautions / Restrictions Precautions Precautions: Fall Restrictions Weight Bearing Restrictions: No      Mobility  Bed Mobility Overal bed mobility: Modified Independent             General bed mobility comments: Supine to sit with HOB elevated; mild increased effort and time to perform but no assist required  Transfers Overall transfer level: Needs assistance Equipment used: Straight cane Transfers: Sit to/from Stand Sit to Stand: Min guard;Supervision         General transfer comment: mild increased effort to stand but  steady  Ambulation/Gait Ambulation/Gait assistance: Min guard;Supervision Ambulation Distance (Feet): 160 Feet Assistive device: Straight cane   Gait velocity: mildly decreased   General Gait Details: mild increased B lateral sway but steady gait; use of SPC but not reliant on cane; appears to be close to baseline gait   Stairs            Wheelchair Mobility    Modified Rankin (Stroke Patients Only)       Balance Overall balance assessment: Needs assistance Sitting-balance support: No upper extremity supported;Feet supported Sitting balance-Leahy Scale: Normal Sitting balance - Comments: steady sitting reaching outside BOS   Standing balance support: No upper extremity supported Standing balance-Leahy Scale: Good Standing balance comment: steady standing reaching within BOS                             Pertinent Vitals/Pain Pain Assessment: No/denies pain  Vitals (HR and O2 on room air) stable and WFL throughout treatment session.    Home Living Family/patient expects to be discharged to:: Private residence Living Arrangements: Alone Available Help at Discharge: Family(Niece assists with errands; there 1x/week)   Home Access: Stairs to enter Entrance Stairs-Rails: Right;Left Entrance Stairs-Number of Steps: 3-4 (uses R railing) Home Layout: One level Home Equipment: Walker - 2 wheels;Cane - single point Additional Comments: Pt reports RW doesn't fit through his doors in home.    Prior Function Level of Independence: Independent with assistive device(s)         Comments: Pt uses SPC for ambulation.  Pt works part time 4  hours a day for 4 days a week.  (+) driving.  Reports no h/o falls in past 6 months.      Hand Dominance        Extremity/Trunk Assessment   Upper Extremity Assessment Upper Extremity Assessment: Generalized weakness    Lower Extremity Assessment Lower Extremity Assessment: Generalized weakness       Communication    Communication: No difficulties  Cognition Arousal/Alertness: Awake/alert Behavior During Therapy: WFL for tasks assessed/performed Overall Cognitive Status: Within Functional Limits for tasks assessed                                        General Comments General comments (skin integrity, edema, etc.): Red drainage noted from R LE (lower leg) on linens; nursing notified.  Nursing cleared pt for participation in physical therapy.  Pt agreeable to PT session.    Exercises     Assessment/Plan    PT Assessment Patient needs continued PT services  PT Problem List Decreased strength;Decreased mobility;Decreased skin integrity       PT Treatment Interventions DME instruction;Gait training;Stair training;Functional mobility training;Therapeutic activities;Therapeutic exercise;Balance training;Patient/family education    PT Goals (Current goals can be found in the Care Plan section)  Acute Rehab PT Goals Patient Stated Goal: to go home PT Goal Formulation: With patient Time For Goal Achievement: 11/08/17 Potential to Achieve Goals: Good    Frequency Min 2X/week   Barriers to discharge        Co-evaluation               AM-PAC PT "6 Clicks" Daily Activity  Outcome Measure Difficulty turning over in bed (including adjusting bedclothes, sheets and blankets)?: A Little Difficulty moving from lying on back to sitting on the side of the bed? : A Little Difficulty sitting down on and standing up from a chair with arms (e.g., wheelchair, bedside commode, etc,.)?: A Little Help needed moving to and from a bed to chair (including a wheelchair)?: A Little Help needed walking in hospital room?: A Little Help needed climbing 3-5 steps with a railing? : A Little 6 Click Score: 18    End of Session Equipment Utilized During Treatment: Gait belt Activity Tolerance: Patient tolerated treatment well Patient left: in chair;with call bell/phone within reach;with chair  alarm set Nurse Communication: Mobility status;Precautions;Other (comment)(Small amount of red drainage from R lower leg) PT Visit Diagnosis: Other abnormalities of gait and mobility (R26.89);Muscle weakness (generalized) (M62.81)    Time: 4128-7867 PT Time Calculation (min) (ACUTE ONLY): 33 min   Charges:   PT Evaluation $PT Eval Low Complexity: 1 Low PT Treatments $Therapeutic Activity: 8-22 mins   PT G CodesLeitha Bleak, PT 10/25/17, 10:37 AM [phone]

## 2017-10-25 NOTE — Plan of Care (Signed)
Contacted Diabetes Educator - pt's FBS was 126 and he has 84 u 70/30 ordered in addition to sliding scale.  Pt ate only 1/2 his breakfast.  DE sd she would call Dr. Posey Pronto - and recommend Levemir vs the 70/30.  Holding 70/30 until I  Talk to dr.

## 2017-10-25 NOTE — Consult Note (Signed)
Pt much more alert today, afebrile as well.  Started on antibiotics.  Awake alert and oriented.  To get a unit of blood today and may go home tomorrow. To follow up with me Thursday 1/31.

## 2017-10-25 NOTE — Progress Notes (Signed)
[facility] at [place] NAME: Corey West    MR#:  [id number]  DATE OF BIRTH:  20-Apr-1951  SUBJECTIVE:  Patient awake alert ambulating to the bathroom doing well starting to feel better No fever Denies abdominal pain or distention REVIEW OF SYSTEMS:   Review of Systems  Constitutional: Negative for chills, fever and weight loss.  HENT: Negative for ear discharge, ear pain and nosebleeds.   Eyes: Negative for blurred vision, pain and discharge.  Respiratory: Negative for sputum production, shortness of breath, wheezing and stridor.   Cardiovascular: Negative for chest pain, palpitations, orthopnea and PND.  Gastrointestinal: Negative for abdominal pain, diarrhea, nausea and vomiting.  Genitourinary: Negative for frequency and urgency.  Musculoskeletal: Negative for back pain and joint pain.  Neurological: Negative for sensory change, speech change, focal weakness and weakness.  Psychiatric/Behavioral: Negative for depression and hallucinations. The patient is not nervous/anxious.    Tolerating Diet: Yes Tolerating PT: No PT needs.  Patient has a cane which is recommended to be used  DRUG ALLERGIES:  No Known Allergies  VITALS:  Blood pressure (!) 121/47, pulse 76, temperature 98 F (36.7 C), temperature source Oral, resp. rate 20, height 5\' 9"  (1.753 m), weight (!) 145.3 kg (320 lb 6.4 oz), SpO2 100 %.  PHYSICAL EXAMINATION:   Physical Exam  GENERAL:  67 y.o.-year-old patient lying in the bed with no acute distress.  Morbid obesity EYES: Pupils equal, round, reactive to light and accommodation. No scleral icterus. Extraocular muscles intact.  HEENT: Head atraumatic, normocephalic. Oropharynx and nasopharynx clear.  NECK:  Supple, no jugular venous distention. No thyroid enlargement, no tenderness.  LUNGS: Normal breath sounds bilaterally, no wheezing, rales, rhonchi. No use of accessory muscles of respiration.  CARDIOVASCULAR: S1,  S2 normal. No murmurs, rubs, or gallops.  ABDOMEN: Soft, nontender, nondistended. Bowel sounds present. No organomegaly or mass.  EXTREMITIES: Bilateral lower extremity chronic venous congestion with compression wrap.  Patient appears to have cellulitis bilaterally more in the right than the left lower extremity NEUROLOGIC: Nonfocal.  No focal weakness.  Cranial nerves intact PSYCHIATRIC: Alert and oriented x3.  SKIN: Poor hygiene bilateral lower extremity feet dry skin  LABORATORY PANEL:  CBC Recent Labs  Lab 10/24/17 0654  WBC 10.5  HGB 7.8*  HCT 23.6*  PLT 105*    Chemistries  Recent Labs  Lab 10/24/17 0654  NA 142  K 3.8  CL 114*  CO2 22  GLUCOSE 190*  BUN 23*  CREATININE 1.73*  CALCIUM 9.2  AST 37  ALT 26  ALKPHOS 80  BILITOT 1.1   Cardiac Enzymes No results for input(s): TROPONINI in the last 168 hours. RADIOLOGY:  No results found. ASSESSMENT AND PLAN:  67 year old male with history of diabetes and EtOH related liver cirrhosis who was directly admitted for confusion.  1. sepsis suspected secondary to bilateral lower extremity cellulitis more on the left than right.   Although clinically patient does not appear to have abdominal pain or ascites. -Start on IV cefepime--IV cefazolin -Follow blood culture negative -Chest x-ray negative -Monitor fever curve--afebrile  2. Acute on chronic blood loss anemia with history of Barrett's/AVM and iron deficiency anemia and recent EGD in early January which did not show evidence of bleeding or clots Hemoglobin 7.8--1 unit PRBC today--hgb in am  3 chronic Liver cirrhosis - Patient will continue on Lasix and Aldactone  4. Acute hepatic encephalopathy--resolved  Ammonia level is elevated which correlates with hepatic encephalopathy. Came  in with ammonia of 72----71 Continue lactulose and Xifaxan  5. Diabetes: SSI ordered Changed to levemir 25 units bid  6.Chronic kidney disease stage III creatinine 1.8 to---  1.73   7. Chronic lower extremity edema: Wound care consultation requested  D/c in am if remains stable.  D/w Tiffany Kocher updated    Case discussed with Care Management/Social Worker. Management plans discussed with the patient, family and they are in agreement.  CODE STATUS: Full DVT Prophylaxis: Avoid DVT prophylaxis secondary to low platelets, chronic due to liver cirrhosis  TOTAL TIME TAKING CARE OF THIS PATIENT: 30* minutes.  >50% time spent on counselling and coordination of care  POSSIBLE D/C IN few DAYS, DEPENDING ON CLINICAL CONDITION.  Note: This dictation was prepared with Dragon dictation along with smaller phrase technology. Any transcriptional errors that result from this process are unintentional.  Corey West M.D on 10/25/2017 at 11:30 AM  Between 7am to 6pm - Pager - [phone]  After 6pm go to [email or web address] - password [email or web address] [facility]  Office  [phone]  CC: Primary care physician; Baxter Hire, MDPatient ID: Corey West, male   DOB: 1951/02/17, 67 y.o.   MRN: [id number]

## 2017-10-25 NOTE — Care Management Important Message (Signed)
Important Message  Patient Details  Name: Corey West MRN: [id number] Date of Birth: March 07, 1951   Medicare Important Message Given:  Yes    Shelbie Ammons, RN 10/25/2017, 6:47 AM

## 2017-10-26 LAB — CBC
HEMATOCRIT: 24.4 % — AB (ref 40.0–52.0)
HEMOGLOBIN: 8.3 g/dL — AB (ref 13.0–18.0)
MCH: 31.3 pg (ref 26.0–34.0)
MCHC: 33.9 g/dL (ref 32.0–36.0)
MCV: 92.4 fL (ref 80.0–100.0)
Platelets: 98 10*3/uL — ABNORMAL LOW (ref 150–440)
RBC: 2.64 MIL/uL — ABNORMAL LOW (ref 4.40–5.90)
RDW: 18.3 % — AB (ref 11.5–14.5)
WBC: 7.7 10*3/uL (ref 3.8–10.6)

## 2017-10-26 LAB — BPAM RBC
Blood Product Expiration Date: 201902062359
ISSUE DATE / TIME: 201901251531
Unit Type and Rh: 600

## 2017-10-26 LAB — TYPE AND SCREEN
ABO/RH(D): A NEG
ANTIBODY SCREEN: NEGATIVE
Unit division: 0

## 2017-10-26 LAB — GLUCOSE, CAPILLARY: Glucose-Capillary: 106 mg/dL — ABNORMAL HIGH (ref 65–99)

## 2017-10-26 MED ORDER — INSULIN ASPART PROT & ASPART (70-30 MIX) 100 UNIT/ML ~~LOC~~ SUSP: 50.0000 [IU] | Freq: Two times a day (BID) | SUBCUTANEOUS | 2 refills | Status: DC

## 2017-10-26 MED ORDER — CEPHALEXIN 500 MG PO CAPS
500.0000 mg | ORAL_CAPSULE | Freq: Two times a day (BID) | ORAL | Status: DC
  Administered 2017-10-26: 10:00:00 500 mg via ORAL
  Filled 2017-10-26: qty 1

## 2017-10-26 MED ORDER — CEPHALEXIN 500 MG PO CAPS: 500.0000 mg | ORAL_CAPSULE | Freq: Two times a day (BID) | ORAL | 0 refills | Status: DC

## 2017-10-26 NOTE — Progress Notes (Signed)
Pt D/C to home with niece. D/C paperwork explained. IV removed intact. VSS.

## 2017-10-26 NOTE — Discharge Summary (Signed)
[facility] at [place] NAME: Corey West    MR#:  [id number]  DATE OF BIRTH:  24-Apr-1951  DATE OF ADMISSION:  10/23/2017 ADMITTING PHYSICIAN: Bettey Costa, MD  DATE OF DISCHARGE: 10/26/2017  PRIMARY CARE PHYSICIAN: Baxter Hire, MD    ADMISSION DIAGNOSIS:  Hepatic encephalopathy ([place]) [K72.90]  DISCHARGE DIAGNOSIS:  Sepsis due to Cellulitis-resolved Hepatic encephalopathy in the setting of sepsis resolved biLateral chronic lower extremity edema Acute on chronic anemia s/p post 1 unit blood transfusion SECONDARY DIAGNOSIS:   Past Medical History:  Diagnosis Date  . Anemia   . Anxiety    controlled;   . Arthritis   . AVM (arteriovenous malformation) of stomach, acquired with hemorrhage   . Barrett's esophagus   . Chronic kidney disease    renal infufficiency  . Cirrhosis ([place])   . Depression    controlled;   Marland Kitchen Diabetes mellitus without complication ([place])    not controlled, taking insulin but sugar continues to run high;   . Edema   . Esophageal varices ([place])   . GERD (gastroesophageal reflux disease)   . History of hiatal hernia   . Hyperlipidemia   . Hypertension    controlled well;   . 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 Obesity   . Pancytopenia ([place])   . Polyp, stomach    with chronic blood loss  . Sleep apnea    does not wear a cpap, Medicare would not pay for it   . Venous stasis dermatitis of both lower extremities   . Venous stasis of both lower extremities    with cellulitis    HOSPITAL COURSE:  67 year old male with history of diabetes and EtOH related liver cirrhosiswho was directly admitted for confusion.  1. sepsis suspected secondary to bilateral lower extremity cellulitis more on the left than right.   Although clinically patient does not appear to have abdominal pain or ascites. -Start on IV cefepime--IV cefazolin--to po keflex -Follow blood culture negative -Chest x-ray negative -Monitor fever  curve--afebrile Unna boots reapplied  2. Acute on chronic blood loss anemia with history of Barrett's/AVM and iron deficiency anemia and recent EGD in early January which did not show evidence of bleeding or clots Hemoglobin 7.8--1 unit PRBC --8.2  3 chronicLiver cirrhosis - Patient will continue on Lasixand Aldactone  4. Acute hepatic encephalopathy--resolved Ammonia level is elevated which correlates with hepatic encephalopathy. Came in with ammonia of 72----71 Continue lactulose and Xifaxan  5. Diabetes:SSI ordered Changed to levemir 25 units bid  6.Chronic kidney disease stage III creatinine 1.8 to--- 1.73   7. Chronic lower extremity edema: Wound care consultation requested -reapplied unna boots  Overall stable for d/c home   CONSULTS OBTAINED:    DRUG ALLERGIES:  No Known Allergies  DISCHARGE MEDICATIONS:   Allergies as of 10/26/2017   No Known Allergies     Medication List    TAKE these medications   cephALEXin 500 MG capsule Commonly known as:  KEFLEX Take 1 capsule (500 mg total) by mouth every 12 (twelve) hours.   FERROUS SULFATE ER PO Take 325 mg by mouth 2 (two) times daily.   furosemide 40 MG tablet Commonly known as:  LASIX Take 40 mg by mouth 2 (two) times daily.   Gerhardt's butt cream Crea Apply 1 application topically 2 (two) times daily.   insulin aspart protamine- aspart (70-30) 100 UNIT/ML injection Commonly known as:  NOVOLOG MIX 70/30 Inject 0.5 mLs (50 Units  total) into the skin 2 (two) times daily with a meal. What changed:    how much to take  additional instructions   lactulose 10 GM/15ML solution Commonly known as:  CHRONULAC Take 30 g by mouth 3 (three) times daily.   LORazepam 0.5 MG tablet Commonly known as:  ATIVAN Take 0.5 mg by mouth 3 (three) times daily as needed for anxiety.   multivitamin tablet Take 1 tablet by mouth daily.   omeprazole 40 MG capsule Commonly known as:  PRILOSEC Take 40 mg  by mouth 2 (two) times daily.   potassium chloride SA 20 MEQ tablet Commonly known as:  K-DUR,KLOR-CON Take 1 tablet (20 mEq total) by mouth daily. Take BID for 2 days after  That resume once aday   rifaximin 550 MG Tabs tablet Commonly known as:  XIFAXAN Take 550 mg by mouth 2 (two) times daily.   spironolactone 25 MG tablet Commonly known as:  ALDACTONE Take 25 mg by mouth daily.   traMADol 50 MG tablet Commonly known as:  ULTRAM Take 50 mg by mouth every 8 (eight) hours as needed.       If you experience worsening of your admission symptoms, develop shortness of breath, life threatening emergency, suicidal or homicidal thoughts you must seek medical attention immediately by calling 911 or calling your MD immediately  if symptoms less severe.  You Must read complete instructions/literature along with all the possible adverse reactions/side effects for all the Medicines you take and that have been prescribed to you. Take any new Medicines after you have completely understood and accept all the possible adverse reactions/side effects.   Please note  You were cared for by a hospitalist during your hospital stay. If you have any questions about your discharge medications or the care you received while you were in the hospital after you are discharged, you can call the unit and asked to speak with the hospitalist on call if the hospitalist that took care of you is not available. Once you are discharged, your primary care physician will handle any further medical issues. Please note that NO REFILLS for any discharge medications will be authorized once you are discharged, as it is imperative that you return to your primary care physician (or establish a relationship with a primary care physician if you do not have one) for your aftercare needs so that they can reassess your need for medications and monitor your lab values. Today   SUBJECTIVE   No new complaints  VITAL SIGNS:  Blood  pressure (!) 125/55, pulse 68, temperature 97.9 F (36.6 C), temperature source Oral, resp. rate 20, height 5\' 9"  (1.753 m), weight (!) 142.4 kg (314 lb), SpO2 100 %.  I/O:    Intake/Output Summary (Last 24 hours) at 10/26/2017 0924 Last data filed at 10/25/2017 1602 Gross per 24 hour  Intake 50 ml  Output -  Net 50 ml    PHYSICAL EXAMINATION:  GENERAL:  67 y.o.-year-old patient lying in the bed with no acute distress. obese EYES: Pupils equal, round, reactive to light and accommodation. No scleral icterus. Extraocular muscles intact.  HEENT: Head atraumatic, normocephalic. Oropharynx and nasopharynx clear.  NECK:  Supple, no jugular venous distention. No thyroid enlargement, no tenderness.  LUNGS: Normal breath sounds bilaterally, no wheezing, rales,rhonchi or crepitation. No use of accessory muscles of respiration.  CARDIOVASCULAR: S1, S2 normal. No murmurs, rubs, or gallops.  ABDOMEN: Soft, non-tender, non-distended. Bowel sounds present. No organomegaly or mass.  EXTREMITIES: Unna boots. Chronic  venous stasis NEUROLOGIC: Cranial nerves II through XII are intact. Muscle strength 5/5 in all extremities. Sensation intact. Gait not checked.  PSYCHIATRIC: The patient is alert and oriented x 3.  SKIN: No obvious rash, lesion, or ulcer.   DATA REVIEW:   CBC  Recent Labs  Lab 10/26/17 0459  WBC 7.7  HGB 8.3*  HCT 24.4*  PLT 98*    Chemistries  Recent Labs  Lab 10/24/17 0654  NA 142  K 3.8  CL 114*  CO2 22  GLUCOSE 190*  BUN 23*  CREATININE 1.73*  CALCIUM 9.2  AST 37  ALT 26  ALKPHOS 80  BILITOT 1.1    Microbiology Results   Recent Results (from the past 240 hour(s))  CULTURE, BLOOD (ROUTINE X 2) w Reflex to ID Panel     Status: None (Preliminary result)   Collection Time: 10/24/17  7:14 AM  Result Value Ref Range Status   Specimen Description BLOOD BLOOD LEFT HAND  Final   Special Requests   Final    BOTTLES DRAWN AEROBIC AND ANAEROBIC Blood Culture adequate  volume   Culture   Final    NO GROWTH 2 DAYS Performed at [facility], [address], [place], [place] [address]    Report Status PENDING  Incomplete  CULTURE, BLOOD (ROUTINE X 2) w Reflex to ID Panel     Status: None (Preliminary result)   Collection Time: 10/24/17  7:23 AM  Result Value Ref Range Status   Specimen Description BLOOD RIGHT ANTECUBITAL  Final   Special Requests   Final    BOTTLES DRAWN AEROBIC AND ANAEROBIC Blood Culture results may not be optimal due to an inadequate volume of blood received in culture bottles   Culture   Final    NO GROWTH 2 DAYS Performed at [facility], [address], [place], [place] [address]    Report Status PENDING  Incomplete    RADIOLOGY:  No results found.   Management plans discussed with the patient, family and they are in agreement.  CODE STATUS:     Code Status Orders  (From admission, onward)        Start     Ordered   10/23/17 1404  Full code  Continuous     10/23/17 1404    Code Status History    Date Active Date Inactive Code Status Order ID Comments User Context   10/18/2017 00:58 10/19/2017 18:06 Full Code [id number]  Saundra Shelling, MD Inpatient   10/09/2017 21:39 10/11/2017 18:06 Full Code [id number]  [name], MD Inpatient   09/03/2017 21:24 09/05/2017 15:52 Full Code [id number]  Nicholes Mango, MD Inpatient   06/26/2017 11:19 06/28/2017 17:48 Full Code [id number]  Henreitta Leber, MD Inpatient   02/21/2017 20:07 02/22/2017 18:03 Full Code [id number]  [name], MD Inpatient   07/20/2015 12:45 07/22/2015 22:17 Full Code [id number]  Nicholes Mango, MD Inpatient   02/08/2015 23:31 02/11/2015 19:43 Full Code [id number]  Hower, Aaron Mose, MD ED      TOTAL TIME TAKING CARE OF THIS PATIENT: *40* minutes.    Fritzi Mandes M.D on 10/26/2017 at 9:24 AM  Between 7am to 6pm - Pager - [phone] After 6pm go to [email or web address] - password [email or web address] [facility]  Office   [phone]  CC: Primary care physician; Baxter Hire, MD

## 2017-10-26 NOTE — Care Management Note (Addendum)
Case Management Note  Patient Details  Name: Corey West MRN: [id number] Date of Birth: [birth year]  Subjective/Objective:        Home health resumed with [facility] for Hh=PT and RN called to [facility] at [place].             Action/Plan:   Expected Discharge Date:  10/26/17               Expected Discharge Plan:     In-House Referral:     Discharge planning Services     Post Acute Care Choice:    Choice offered to:     DME Arranged:    DME Agency:     HH Arranged:    HH Agency:     Status of Service:     If discussed at [employer], dates discussed:    Additional Comments:  Raffi Milstein A, RN 10/26/2017, 9:31 AM

## 2017-10-29 LAB — CULTURE, BLOOD (ROUTINE X 2)
Culture: NO GROWTH
Culture: NO GROWTH
Special Requests: ADEQUATE

## 2017-11-05 ENCOUNTER — Other Ambulatory Visit
Admission: RE | Admit: 2017-11-05 | Discharge: 2017-11-05 | Disposition: A | Discharge status: Home / Self Care | Payer: Medicare Other | Source: Ambulatory Visit | Attending: Unknown Physician Specialty | Admitting: Unknown Physician Specialty

## 2017-11-05 DIAGNOSIS — K7460 Unspecified cirrhosis of liver: Secondary | ICD-10-CM | POA: Diagnosis present

## 2017-11-05 LAB — AMMONIA: Ammonia: 82 umol/L — ABNORMAL HIGH (ref 9–35)

## 2017-11-06 ENCOUNTER — Encounter
Admission: RE | Admit: 2017-11-06 | Discharge: 2017-11-06 | Disposition: A | Discharge status: Home / Self Care | Payer: Medicare Other | Source: Ambulatory Visit | Attending: Unknown Physician Specialty | Admitting: Unknown Physician Specialty

## 2017-11-06 DIAGNOSIS — D500 Iron deficiency anemia secondary to blood loss (chronic): Secondary | ICD-10-CM | POA: Diagnosis present

## 2017-11-06 DIAGNOSIS — K5520 Angiodysplasia of colon without hemorrhage: Secondary | ICD-10-CM | POA: Diagnosis not present

## 2017-11-06 DIAGNOSIS — K7460 Unspecified cirrhosis of liver: Secondary | ICD-10-CM | POA: Diagnosis not present

## 2017-11-06 LAB — PREPARE RBC (CROSSMATCH)

## 2017-11-06 LAB — HEMOGLOBIN AND HEMATOCRIT, BLOOD
HEMATOCRIT: 20.8 % — AB (ref 40.0–52.0)
Hemoglobin: 7.1 g/dL — ABNORMAL LOW (ref 13.0–18.0)

## 2017-11-07 ENCOUNTER — Ambulatory Visit
Admission: RE | Admit: 2017-11-07 | Discharge: 2017-11-07 | Disposition: A | Discharge status: Home / Self Care | Payer: Medicare Other | Source: Ambulatory Visit | Attending: Unknown Physician Specialty | Admitting: Unknown Physician Specialty

## 2017-11-07 DIAGNOSIS — K5520 Angiodysplasia of colon without hemorrhage: Secondary | ICD-10-CM | POA: Insufficient documentation

## 2017-11-07 DIAGNOSIS — K7460 Unspecified cirrhosis of liver: Secondary | ICD-10-CM | POA: Insufficient documentation

## 2017-11-07 DIAGNOSIS — D500 Iron deficiency anemia secondary to blood loss (chronic): Secondary | ICD-10-CM | POA: Diagnosis not present

## 2017-11-07 MED ORDER — SODIUM CHLORIDE 0.9 % IV SOLN: Freq: Once | INTRAVENOUS | Status: DC

## 2017-11-08 ENCOUNTER — Emergency Department: Payer: Medicare Other

## 2017-11-08 ENCOUNTER — Emergency Department
Admission: EM | Admit: 2017-11-08 | Discharge: 2017-11-08 | Disposition: A | Discharge status: Home / Self Care | Payer: Medicare Other | Attending: Emergency Medicine | Admitting: Emergency Medicine

## 2017-11-08 ENCOUNTER — Other Ambulatory Visit: Payer: Self-pay

## 2017-11-08 ENCOUNTER — Ambulatory Visit: Admission: RE | Admit: 2017-11-08 | Payer: Medicare Other | Source: Ambulatory Visit

## 2017-11-08 DIAGNOSIS — R7989 Other specified abnormal findings of blood chemistry: Secondary | ICD-10-CM | POA: Diagnosis not present

## 2017-11-08 DIAGNOSIS — I129 Hypertensive chronic kidney disease with stage 1 through stage 4 chronic kidney disease, or unspecified chronic kidney disease: Secondary | ICD-10-CM | POA: Insufficient documentation

## 2017-11-08 DIAGNOSIS — Z79899 Other long term (current) drug therapy: Secondary | ICD-10-CM | POA: Diagnosis not present

## 2017-11-08 DIAGNOSIS — R4182 Altered mental status, unspecified: Secondary | ICD-10-CM | POA: Diagnosis not present

## 2017-11-08 DIAGNOSIS — F17290 Nicotine dependence, other tobacco product, uncomplicated: Secondary | ICD-10-CM | POA: Diagnosis not present

## 2017-11-08 DIAGNOSIS — N189 Chronic kidney disease, unspecified: Secondary | ICD-10-CM | POA: Insufficient documentation

## 2017-11-08 DIAGNOSIS — E1122 Type 2 diabetes mellitus with diabetic chronic kidney disease: Secondary | ICD-10-CM | POA: Insufficient documentation

## 2017-11-08 LAB — COMPREHENSIVE METABOLIC PANEL
ALK PHOS: 80 U/L (ref 38–126)
ALT: 18 U/L (ref 17–63)
AST: 26 U/L (ref 15–41)
Albumin: 2.8 g/dL — ABNORMAL LOW (ref 3.5–5.0)
Anion gap: 8 (ref 5–15)
BILIRUBIN TOTAL: 0.8 mg/dL (ref 0.3–1.2)
BUN: 25 mg/dL — ABNORMAL HIGH (ref 6–20)
CALCIUM: 9.6 mg/dL (ref 8.9–10.3)
CO2: 29 mmol/L (ref 22–32)
CREATININE: 1.92 mg/dL — AB (ref 0.61–1.24)
Chloride: 102 mmol/L (ref 101–111)
GFR, EST AFRICAN AMERICAN: 41 mL/min — AB (ref >60)
GFR, EST NON AFRICAN AMERICAN: 35 mL/min — AB (ref >60)
Glucose, Bld: 171 mg/dL — ABNORMAL HIGH (ref 65–99)
Potassium: 3.4 mmol/L — ABNORMAL LOW (ref 3.5–5.1)
SODIUM: 139 mmol/L (ref 135–145)
TOTAL PROTEIN: 6.7 g/dL (ref 6.5–8.1)

## 2017-11-08 LAB — URINALYSIS, COMPLETE (UACMP) WITH MICROSCOPIC
BILIRUBIN URINE: NEGATIVE
Bacteria, UA: NONE SEEN
Glucose, UA: NEGATIVE mg/dL
HGB URINE DIPSTICK: NEGATIVE
Ketones, ur: NEGATIVE mg/dL
Leukocytes, UA: NEGATIVE
NITRITE: NEGATIVE
PH: 7 (ref 5.0–8.0)
Protein, ur: NEGATIVE mg/dL
SPECIFIC GRAVITY, URINE: 1.008 (ref 1.005–1.030)

## 2017-11-08 LAB — CBC
HCT: 25.3 % — ABNORMAL LOW (ref 40.0–52.0)
HEMOGLOBIN: 8.6 g/dL — AB (ref 13.0–18.0)
MCH: 31.1 pg (ref 26.0–34.0)
MCHC: 34 g/dL (ref 32.0–36.0)
MCV: 91.3 fL (ref 80.0–100.0)
PLATELETS: 162 10*3/uL (ref 150–440)
RBC: 2.77 MIL/uL — ABNORMAL LOW (ref 4.40–5.90)
RDW: 16.1 % — ABNORMAL HIGH (ref 11.5–14.5)
WBC: 5.3 10*3/uL (ref 3.8–10.6)

## 2017-11-08 LAB — BPAM RBC
BLOOD PRODUCT EXPIRATION DATE: 201902202359
Blood Product Expiration Date: 201902212359
ISSUE DATE / TIME: 201902070929
ISSUE DATE / TIME: 201902070740
UNIT TYPE AND RH: 600
Unit Type and Rh: 600

## 2017-11-08 LAB — TYPE AND SCREEN
ABO/RH(D): A NEG
ANTIBODY SCREEN: NEGATIVE
EXTEND SAMPLE REASON: TRANSFUSED
Unit division: 0
Unit division: 0

## 2017-11-08 LAB — AMMONIA: AMMONIA: 46 umol/L — AB (ref 9–35)

## 2017-11-08 LAB — BRAIN NATRIURETIC PEPTIDE: B Natriuretic Peptide: 48 pg/mL (ref 0.0–100.0)

## 2017-11-08 LAB — GLUCOSE, CAPILLARY: GLUCOSE-CAPILLARY: 164 mg/dL — AB (ref 65–99)

## 2017-11-08 LAB — TROPONIN I

## 2017-11-08 MED ORDER — POTASSIUM CHLORIDE 20 MEQ/15ML (10%) PO SOLN
40.0000 meq | Freq: Once | ORAL | Status: AC
  Administered 2017-11-08: 40 meq via ORAL
  Filled 2017-11-08: qty 30

## 2017-11-08 MED ORDER — LACTULOSE 10 GM/15ML PO SOLN
10.0000 g | Freq: Once | ORAL | Status: AC
  Administered 2017-11-08: 10 g via ORAL
  Filled 2017-11-08: qty 30

## 2017-11-08 MED ORDER — FUROSEMIDE 10 MG/ML IJ SOLN
40.0000 mg | Freq: Once | INTRAMUSCULAR | Status: AC
  Administered 2017-11-08: 40 mg via INTRAVENOUS
  Filled 2017-11-08: qty 4

## 2017-11-08 NOTE — ED Provider Notes (Signed)
[facility] Emergency Department Provider Note   ____________________________________________   First MD Initiated Contact with Patient 11/08/17 [phone]     (approximate)  I have reviewed the triage vital signs and the nursing notes.   HISTORY  Chief Complaint Altered Mental Status (states "I feel delusional, my orientation is questionable")    HPI Corey West is a 67 y.o. male Who has a history of cirrhosis. He said he was not feeling quite right mentally this morning so he called EMS to bring him in. He is afraid he might get lost. On arrival here he is awake alert oriented 3. He knows the day and the date can hold an intelligent conversation about his work. The only thing he gets wrongs he said when asked the year he says "it's 1900 no it's 19,000"no and he finally gets 2019 right.has no cough no belly pain no fever no vomiting no shortness of breath he said he got 2 units of blood couple days ago and his lactulose dose is increased to double in fact he says he thinks his potassium might be low. Sometimes he feels this way when it gets low.   Past Medical History:  Diagnosis Date  . Anemia   . Anxiety    controlled;   . Arthritis   . AVM (arteriovenous malformation) of stomach, acquired with hemorrhage   . Barrett's esophagus   . Chronic kidney disease    renal infufficiency  . Cirrhosis ([place])   . Depression    controlled;   Marland Kitchen Diabetes mellitus without complication ([place])    not controlled, taking insulin but sugar continues to run high;   . Edema   . Esophageal varices ([place])   . GERD (gastroesophageal reflux disease)   . History of hiatal hernia   . Hyperlipidemia   . Hypertension    controlled well;   . 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 Obesity   . Pancytopenia ([place])   . Polyp, stomach    with chronic blood loss  . Sleep apnea    does not wear a cpap, Medicare would not pay for it   . Venous stasis dermatitis of both lower extremities   .  Venous stasis of both lower extremities    with cellulitis    Patient Active Problem List   Diagnosis Date Noted  . Hepatic encephalopathy ([place]) 10/18/2017  . Hypokalemia 10/09/2017  . Hyperglycemia 10/09/2017  . Symptomatic anemia 06/26/2017  . Sepsis ([place]) 06/12/2017  . Cellulitis in diabetic foot ([place]) 02/21/2017  . Uncontrolled diabetes mellitus ([place]) 02/21/2017  . Cellulitis 02/21/2017  . GI bleed 07/20/2015  . Hematemesis 02/08/2015  . Cirrhosis ([place]) 02/08/2015  . Esophageal varices ([place]) 02/08/2015  . Type 2 diabetes mellitus ([place]) 02/08/2015  . Gastroesophageal reflux disease 02/08/2015  . Hyperlipidemia 02/08/2015  . Anemia 12/04/2013  . Thrombocytopenia ([place]) 12/04/2013  . Splenomegaly 11/04/2013    Past Surgical History:  Procedure Laterality Date  . ESOPHAGOGASTRODUODENOSCOPY N/A 02/09/2015   Procedure: ESOPHAGOGASTRODUODENOSCOPY (EGD);  Surgeon: Manya Silvas, MD;  Location: [facility] ENDOSCOPY;  Service: Endoscopy;  Laterality: N/A;  . ESOPHAGOGASTRODUODENOSCOPY N/A 07/22/2015   Procedure: ESOPHAGOGASTRODUODENOSCOPY (EGD);  Surgeon: Manya Silvas, MD;  Location: [facility] ENDOSCOPY;  Service: Endoscopy;  Laterality: N/A;  . ESOPHAGOGASTRODUODENOSCOPY N/A 06/26/2017   Procedure: ESOPHAGOGASTRODUODENOSCOPY (EGD);  Surgeon: Manya Silvas, MD;  Location: [facility] ENDOSCOPY;  Service: Endoscopy;  Laterality: N/A;  . ESOPHAGOGASTRODUODENOSCOPY N/A 06/27/2017   Procedure: ESOPHAGOGASTRODUODENOSCOPY (EGD);  Surgeon: Manya Silvas,  MD;  Location: [facility] ENDOSCOPY;  Service: Endoscopy;  Laterality: N/A;  . ESOPHAGOGASTRODUODENOSCOPY N/A 06/28/2017   Procedure: ESOPHAGOGASTRODUODENOSCOPY (EGD);  Surgeon: Manya Silvas, MD;  Location[facility] ENDOSCOPY;  Service: Endoscopy;  Laterality: N/A;  . ESOPHAGOGASTRODUODENOSCOPY Bilateral 10/11/2017   Procedure: ESOPHAGOGASTRODUODENOSCOPY (EGD);  Surgeon: Manya Silvas, MD;  Location: [facility] ENDOSCOPY;  Service: Endoscopy;  Laterality:  Bilateral;  . ESOPHAGOGASTRODUODENOSCOPY (EGD) WITH PROPOFOL N/A 07/20/2015   Procedure: ESOPHAGOGASTRODUODENOSCOPY (EGD) WITH PROPOFOL;  Surgeon: Manya Silvas, MD;  Location: [facility] ENDOSCOPY;  Service: Endoscopy;  Laterality: N/A;  . ESOPHAGOGASTRODUODENOSCOPY (EGD) WITH PROPOFOL N/A 09/16/2015   Procedure: ESOPHAGOGASTRODUODENOSCOPY (EGD) WITH PROPOFOL;  Surgeon: Manya Silvas, MD;  Location: [facility] ENDOSCOPY;  Service: Endoscopy;  Laterality: N/A;  . ESOPHAGOGASTRODUODENOSCOPY (EGD) WITH PROPOFOL N/A 03/16/2016   Procedure: ESOPHAGOGASTRODUODENOSCOPY (EGD) WITH PROPOFOL;  Surgeon: Manya Silvas, MD;  Location: [facility] ENDOSCOPY;  Service: Endoscopy;  Laterality: N/A;  . ESOPHAGOGASTRODUODENOSCOPY (EGD) WITH PROPOFOL N/A 09/14/2016   Procedure: ESOPHAGOGASTRODUODENOSCOPY (EGD) WITH PROPOFOL;  Surgeon: Manya Silvas, MD;  Location: [facility] ENDOSCOPY;  Service: Endoscopy;  Laterality: N/A;  . ESOPHAGOGASTRODUODENOSCOPY (EGD) WITH PROPOFOL N/A 11/05/2016   Procedure: ESOPHAGOGASTRODUODENOSCOPY (EGD) WITH PROPOFOL;  Surgeon: Manya Silvas, MD;  Location: [facility] ENDOSCOPY;  Service: Endoscopy;  Laterality: N/A;  . ESOPHAGOGASTRODUODENOSCOPY (EGD) WITH PROPOFOL N/A 02/06/2017   Procedure: ESOPHAGOGASTRODUODENOSCOPY (EGD) WITH PROPOFOL;  Surgeon: Manya Silvas, MD;  Location: [facility] ENDOSCOPY;  Service: Endoscopy;  Laterality: N/A;  . ESOPHAGOGASTRODUODENOSCOPY (EGD) WITH PROPOFOL N/A 04/24/2017   Procedure: ESOPHAGOGASTRODUODENOSCOPY (EGD) WITH PROPOFOL;  Surgeon: Manya Silvas, MD;  Location: [facility] ENDOSCOPY;  Service: Endoscopy;  Laterality: N/A;  . ESOPHAGOGASTRODUODENOSCOPY (EGD) WITH PROPOFOL N/A 07/31/2017   Procedure: ESOPHAGOGASTRODUODENOSCOPY (EGD) WITH PROPOFOL;  Surgeon: Manya Silvas, MD;  Location: [facility] ENDOSCOPY;  Service: Endoscopy;  Laterality: N/A;  . TONSILLECTOMY    . TONSILLECTOMY AND ADENOIDECTOMY    . ULNAR NERVE TRANSPOSITION    . UVULOPALATOPHARYNGOPLASTY      Prior  to Admission medications   Medication Sig Start Date End Date Taking? Authorizing Provider  FERROUS SULFATE ER PO Take 325 mg by mouth 2 (two) times daily.    Yes [provider]  furosemide (LASIX) 40 MG tablet Take 40 mg by mouth 2 (two) times daily.   Yes [provider]  insulin aspart protamine- aspart (NOVOLOG MIX 70/30) (70-30) 100 UNIT/ML injection Inject 0.5 mLs (50 Units total) into the skin 2 (two) times daily with a meal. Patient taking differently: Inject 65-84 Units into the skin 2 (two) times daily with a meal. 85units-AM/65units-PM 10/26/17  Yes Fritzi Mandes, MD  lactulose (CHRONULAC) 10 GM/15ML solution Take 30 g by mouth 6 (six) times daily.    Yes [provider]  LORazepam (ATIVAN) 0.5 MG tablet Take 0.5 mg by mouth 3 (three) times daily as needed for anxiety.    Yes [provider]  Multiple Vitamin (MULTIVITAMIN) tablet Take 1 tablet by mouth daily.   Yes [provider]  potassium chloride SA (K-DUR,KLOR-CON) 20 MEQ tablet Take 1 tablet (20 mEq total) by mouth daily. Take BID for 2 days after  That resume once aday Patient taking differently: Take 20 mEq by mouth 2 (two) times daily. Take BID for 2 days after  That resume once aday 10/19/17 01/17/18 Yes Mody, Sital, MD  rifaximin (XIFAXAN) 550 MG TABS tablet Take 550 mg by mouth 2 (two) times daily.   Yes [provider]  traMADol (ULTRAM) 50 MG tablet Take 50  mg by mouth every 8 (eight) hours as needed.    Yes [provider]  cephALEXin (KEFLEX) 500 MG capsule Take 1 capsule (500 mg total) by mouth every 12 (twelve) hours. Patient not taking: Reported on 11/08/2017 10/26/17   Fritzi Mandes, MD  Hydrocortisone (GERHARDT'S BUTT CREAM) CREA Apply 1 application topically 2 (two) times daily. 09/05/17   Nicholes Mango, MD    Allergies Patient has no known allergies.  Family History  Problem Relation Age of Onset  . Diabetes Other     Social History Social History    Tobacco Use  . Smoking status: Current Some Day Smoker    Types: Cigars  . Smokeless tobacco: Never Used  . Tobacco comment: smoke 1 at night occasionally;   Substance Use Topics  . Alcohol use: No    Comment: stopped 12 years ago  . Drug use: No    Review of Systems  Constitutional: No fever/chills Eyes: No visual changes. ENT: No sore throat. Cardiovascular: Denies chest pain. Respiratory: Denies shortness of breath. Gastrointestinal: No abdominal pain.  No nausea, no vomiting.  No diarrhea.  No constipation. Genitourinary: Negative for dysuria. Musculoskeletal: Negative for back pain. Skin: Negative for rash. Neurological: Negative for headaches, focal weakness  ____________________________________________   PHYSICAL EXAM:  VITAL SIGNS: ED Triage Vitals  Enc Vitals Group     BP 11/08/17 0727 (!) 148/62     Pulse Rate 11/08/17 0727 73     Resp 11/08/17 0727 18     Temp 11/08/17 0727 98.1 F (36.7 C)     Temp Source 11/08/17 0727 Oral     SpO2 11/08/17 0727 99 %     Weight 11/08/17 0728 (!) 310 lb (140.6 kg)     Height 11/08/17 0728 5\' 9"  (1.753 m)     Head Circumference --      Peak Flow --      Pain Score 11/08/17 0728 0     Pain Loc --      Pain Edu? --      Excl. in [place]? --     Constitutional: Alert and oriented. Well appearing and in no acute distress. Eyes: Conjunctivae are normal.  Head: Atraumatic. Nose: No congestion/rhinnorhea. Mouth/Throat: Mucous membranes are moist.  Oropharynx non-erythematous. Neck: No stridor.  Cardiovascular: Normal rate, regular rhythm. Grossly normal heart sounds.  Good peripheral circulation. Respiratory: Normal respiratory effort.  No retractions. Lungs CTAB. Gastrointestinal: Soft and nontender. No distention. No abdominal bruits. No CVA tenderness. }Musculoskeletal: No lower extremity tenderness marked edema.  No joint effusions. Neurologic:  Normal speech and language. No gross focal neurologic deficits are  appreciated.  Skin:  Skin is warm, dry and intact. No rash noted except for some erythema above the dressings on both legs. Patient reports this is chronic and has not changed.Marland Kitchen Psychiatric: Mood and affect are normal. Speech and behavior are normal.  ____________________________________________   LABS (all labs ordered are listed, but only abnormal results are displayed)  Labs Reviewed  COMPREHENSIVE METABOLIC PANEL - Abnormal; Notable for the following components:      Result Value   Potassium 3.4 (*)    Glucose, Bld 171 (*)    BUN 25 (*)    Creatinine, Ser 1.92 (*)    Albumin 2.8 (*)    GFR calc non Af Amer 35 (*)    GFR calc Af Amer 41 (*)    All other components within normal limits  CBC - Abnormal; Notable for the following components:  RBC 2.77 (*)    Hemoglobin 8.6 (*)    HCT 25.3 (*)    RDW 16.1 (*)    All other components within normal limits  AMMONIA - Abnormal; Notable for the following components:   Ammonia 46 (*)    All other components within normal limits  GLUCOSE, CAPILLARY - Abnormal; Notable for the following components:   Glucose-Capillary 164 (*)    All other components within normal limits  URINALYSIS, COMPLETE (UACMP) WITH MICROSCOPIC - Abnormal; Notable for the following components:   Color, Urine YELLOW (*)    APPearance CLEAR (*)    Squamous Epithelial / LPF 0-5 (*)    All other components within normal limits  BRAIN NATRIURETIC PEPTIDE  TROPONIN I  DIFFERENTIAL  CBG MONITORING, ED  TYPE AND SCREEN   ____________________________________________  EKG  EKG read and interpreted by me shows normal sinus rhythm a rate of 78 normal axis no acute ST-T wave changes ____________________________________________  RADIOLOGY  ED MD interpretation:  heart slightly large increased interstitial markings possibly CHF  Official radiology report(s): Dg Chest 2 View  Result Date: 11/08/2017 CLINICAL DATA:  Altered mental status.  Smoker. EXAM: CHEST  2  VIEW COMPARISON:  10/17/2017. FINDINGS: Poor inspiration. The cardiac silhouette remains borderline enlarged and the pulmonary vasculature is mildly prominent. Clear lungs. Thoracic spine degenerative changes with DISH. Mild diffuse peribronchial thickening. IMPRESSION: 1. Stable borderline cardiomegaly with interval mild pulmonary vascular congestion. 2. Mild chronic bronchitic changes. Electronically Signed   By: Claudie Revering M.D.   On: 11/08/2017 08:46    ____________________________________________   PROCEDURES  Procedure(s) performed:   Procedures  Critical Care performed:  ____________________________________________   INITIAL IMPRESSION / ASSESSMENT AND PLAN / ED COURSE  at 11:00 patient reports she feels well. O2 sats are normal. He is awake alert and oriented making sense. I will increase his lactulose likely have him follow-up with his doctor he will return if he is any worse at all.         ____________________________________________   FINAL CLINICAL IMPRESSION(S) / ED DIAGNOSES  Final diagnoses:  Increased ammonia level     ED Discharge Orders    None       Note:  This document was prepared using Dragon voice recognition software and may include unintentional dictation errors.    Nena Polio, MD 11/08/17 1101

## 2017-11-08 NOTE — ED Triage Notes (Signed)
Patient states his orientation is questionable, patient is A & O x 4. States he had blood transfusion 2 days ago with 2 units. States his K+ is usually low also when he feels this way and has Hx of Cirrhosis of liver.

## 2017-11-08 NOTE — ED Notes (Signed)
Patient transported to X-ray 

## 2017-11-08 NOTE — ED Notes (Signed)

## 2017-11-08 NOTE — Discharge Instructions (Signed)
your ammonia level is still slightly elevated. Please increase the lactulose to 40 g 3 times a day. Please follow-up with your regular doctor in the next few days to see how you're doing. Return here if you feel any worse or feel like you're not sure self.return if you are placed get more red or swollen or you get short of breath as well.

## 2017-11-08 NOTE — ED Notes (Signed)
EMS CALLED FOR TRANSPORT HOME.

## 2017-11-10 LAB — TYPE AND SCREEN
ABO/RH(D): A NEG
ANTIBODY SCREEN: POSITIVE

## 2017-11-21 ENCOUNTER — Emergency Department: Payer: Medicare Other

## 2017-11-21 ENCOUNTER — Inpatient Hospital Stay
Admission: EM | Admit: 2017-11-21 | Discharge: 2017-11-23 | DRG: 378 | Disposition: A | Discharge status: Home Health | Payer: Medicare Other | Attending: Internal Medicine | Admitting: Internal Medicine

## 2017-11-21 ENCOUNTER — Other Ambulatory Visit: Payer: Self-pay

## 2017-11-21 ENCOUNTER — Encounter: Payer: Self-pay | Admitting: Emergency Medicine

## 2017-11-21 DIAGNOSIS — E876 Hypokalemia: Secondary | ICD-10-CM | POA: Diagnosis present

## 2017-11-21 DIAGNOSIS — I482 Chronic atrial fibrillation: Secondary | ICD-10-CM | POA: Diagnosis present

## 2017-11-21 DIAGNOSIS — D62 Acute posthemorrhagic anemia: Secondary | ICD-10-CM | POA: Diagnosis present

## 2017-11-21 DIAGNOSIS — K219 Gastro-esophageal reflux disease without esophagitis: Secondary | ICD-10-CM | POA: Diagnosis present

## 2017-11-21 DIAGNOSIS — N179 Acute kidney failure, unspecified: Secondary | ICD-10-CM | POA: Diagnosis present

## 2017-11-21 DIAGNOSIS — N189 Chronic kidney disease, unspecified: Secondary | ICD-10-CM

## 2017-11-21 DIAGNOSIS — F17290 Nicotine dependence, other tobacco product, uncomplicated: Secondary | ICD-10-CM | POA: Diagnosis present

## 2017-11-21 DIAGNOSIS — D696 Thrombocytopenia, unspecified: Secondary | ICD-10-CM | POA: Diagnosis present

## 2017-11-21 DIAGNOSIS — K7030 Alcoholic cirrhosis of liver without ascites: Secondary | ICD-10-CM | POA: Diagnosis present

## 2017-11-21 DIAGNOSIS — E785 Hyperlipidemia, unspecified: Secondary | ICD-10-CM | POA: Diagnosis present

## 2017-11-21 DIAGNOSIS — E861 Hypovolemia: Secondary | ICD-10-CM | POA: Diagnosis present

## 2017-11-21 DIAGNOSIS — I872 Venous insufficiency (chronic) (peripheral): Secondary | ICD-10-CM | POA: Diagnosis present

## 2017-11-21 DIAGNOSIS — Z833 Family history of diabetes mellitus: Secondary | ICD-10-CM | POA: Diagnosis not present

## 2017-11-21 DIAGNOSIS — N184 Chronic kidney disease, stage 4 (severe): Secondary | ICD-10-CM | POA: Diagnosis present

## 2017-11-21 DIAGNOSIS — Z794 Long term (current) use of insulin: Secondary | ICD-10-CM

## 2017-11-21 DIAGNOSIS — I998 Other disorder of circulatory system: Secondary | ICD-10-CM | POA: Diagnosis present

## 2017-11-21 DIAGNOSIS — D500 Iron deficiency anemia secondary to blood loss (chronic): Secondary | ICD-10-CM | POA: Diagnosis not present

## 2017-11-21 DIAGNOSIS — K31811 Angiodysplasia of stomach and duodenum with bleeding: Principal | ICD-10-CM | POA: Diagnosis present

## 2017-11-21 DIAGNOSIS — Z79891 Long term (current) use of opiate analgesic: Secondary | ICD-10-CM | POA: Diagnosis not present

## 2017-11-21 DIAGNOSIS — E669 Obesity, unspecified: Secondary | ICD-10-CM | POA: Diagnosis present

## 2017-11-21 DIAGNOSIS — D649 Anemia, unspecified: Secondary | ICD-10-CM

## 2017-11-21 DIAGNOSIS — Z79899 Other long term (current) drug therapy: Secondary | ICD-10-CM

## 2017-11-21 DIAGNOSIS — I129 Hypertensive chronic kidney disease with stage 1 through stage 4 chronic kidney disease, or unspecified chronic kidney disease: Secondary | ICD-10-CM | POA: Diagnosis present

## 2017-11-21 DIAGNOSIS — G4730 Sleep apnea, unspecified: Secondary | ICD-10-CM | POA: Diagnosis present

## 2017-11-21 DIAGNOSIS — Z8249 Family history of ischemic heart disease and other diseases of the circulatory system: Secondary | ICD-10-CM | POA: Diagnosis not present

## 2017-11-21 DIAGNOSIS — E1121 Type 2 diabetes mellitus with diabetic nephropathy: Secondary | ICD-10-CM | POA: Diagnosis present

## 2017-11-21 DIAGNOSIS — D638 Anemia in other chronic diseases classified elsewhere: Secondary | ICD-10-CM | POA: Diagnosis present

## 2017-11-21 DIAGNOSIS — K7290 Hepatic failure, unspecified without coma: Secondary | ICD-10-CM

## 2017-11-21 DIAGNOSIS — E1122 Type 2 diabetes mellitus with diabetic chronic kidney disease: Secondary | ICD-10-CM | POA: Diagnosis present

## 2017-11-21 DIAGNOSIS — K2270 Barrett's esophagus without dysplasia: Secondary | ICD-10-CM | POA: Diagnosis present

## 2017-11-21 DIAGNOSIS — K7682 Hepatic encephalopathy: Secondary | ICD-10-CM

## 2017-11-21 DIAGNOSIS — R0602 Shortness of breath: Secondary | ICD-10-CM

## 2017-11-21 DIAGNOSIS — K922 Gastrointestinal hemorrhage, unspecified: Secondary | ICD-10-CM

## 2017-11-21 DIAGNOSIS — D509 Iron deficiency anemia, unspecified: Secondary | ICD-10-CM | POA: Diagnosis present

## 2017-11-21 DIAGNOSIS — I878 Other specified disorders of veins: Secondary | ICD-10-CM | POA: Diagnosis present

## 2017-11-21 LAB — BASIC METABOLIC PANEL
Anion gap: 7 (ref 5–15)
BUN: 28 mg/dL — ABNORMAL HIGH (ref 6–20)
CO2: 26 mmol/L (ref 22–32)
Calcium: 8.1 mg/dL — ABNORMAL LOW (ref 8.9–10.3)
Chloride: 104 mmol/L (ref 101–111)
Creatinine, Ser: 2.17 mg/dL — ABNORMAL HIGH (ref 0.61–1.24)
GFR calc Af Amer: 35 mL/min — ABNORMAL LOW (ref >60)
GFR calc non Af Amer: 30 mL/min — ABNORMAL LOW (ref >60)
Glucose, Bld: 244 mg/dL — ABNORMAL HIGH (ref 65–99)
Potassium: 3.2 mmol/L — ABNORMAL LOW (ref 3.5–5.1)
Sodium: 137 mmol/L (ref 135–145)

## 2017-11-21 LAB — GLUCOSE, CAPILLARY
GLUCOSE-CAPILLARY: 213 mg/dL — AB (ref 65–99)
GLUCOSE-CAPILLARY: 207 mg/dL — AB (ref 65–99)

## 2017-11-21 LAB — CBC
HCT: 17.5 % — ABNORMAL LOW (ref 40.0–52.0)
HEMOGLOBIN: 5.9 g/dL — AB (ref 13.0–18.0)
MCH: 31.3 pg (ref 26.0–34.0)
MCHC: 33.6 g/dL (ref 32.0–36.0)
MCV: 93.4 fL (ref 80.0–100.0)
Platelets: 142 10*3/uL — ABNORMAL LOW (ref 150–440)
RBC: 1.87 MIL/uL — ABNORMAL LOW (ref 4.40–5.90)
RDW: 16.2 % — ABNORMAL HIGH (ref 11.5–14.5)
WBC: 6.8 10*3/uL (ref 3.8–10.6)

## 2017-11-21 LAB — TROPONIN I: Troponin I: <0.03 ng/mL (ref <0.03)

## 2017-11-21 LAB — FERRITIN: FERRITIN: 23 ng/mL — AB (ref 24–336)

## 2017-11-21 LAB — AMMONIA: AMMONIA: 97 umol/L — AB (ref 9–35)

## 2017-11-21 LAB — PREPARE RBC (CROSSMATCH)

## 2017-11-21 MED ORDER — FUROSEMIDE 40 MG PO TABS
40.0000 mg | ORAL_TABLET | Freq: Two times a day (BID) | ORAL | Status: DC
  Administered 2017-11-22 – 2017-11-23 (×3): 40 mg via ORAL
  Filled 2017-11-21 (×3): qty 1

## 2017-11-21 MED ORDER — FUROSEMIDE 10 MG/ML IJ SOLN
40.0000 mg | Freq: Once | INTRAMUSCULAR | Status: AC
  Administered 2017-11-21: 21:00:00 40 mg via INTRAVENOUS
  Filled 2017-11-21: qty 4

## 2017-11-21 MED ORDER — SODIUM CHLORIDE 0.9 % IV BOLUS (SEPSIS)
1000.0000 mL | Freq: Once | INTRAVENOUS | Status: AC
  Administered 2017-11-21: 1000 mL via INTRAVENOUS

## 2017-11-21 MED ORDER — INSULIN ASPART 100 UNIT/ML ~~LOC~~ SOLN
0.0000 [IU] | Freq: Every day | SUBCUTANEOUS | Status: DC
  Administered 2017-11-21: 2 [IU] via SUBCUTANEOUS
  Administered 2017-11-22: 22:00:00 4 [IU] via SUBCUTANEOUS
  Filled 2017-11-21 (×2): qty 1

## 2017-11-21 MED ORDER — ADULT MULTIVITAMIN W/MINERALS CH
1.0000 | ORAL_TABLET | Freq: Every day | ORAL | Status: DC
  Administered 2017-11-23: 08:00:00 1 via ORAL
  Filled 2017-11-21 (×2): qty 1

## 2017-11-21 MED ORDER — TRAMADOL HCL 50 MG PO TABS: 50.0000 mg | ORAL_TABLET | Freq: Three times a day (TID) | ORAL | Status: DC | PRN

## 2017-11-21 MED ORDER — PANTOPRAZOLE SODIUM 40 MG IV SOLR: 40.0000 mg | Freq: Two times a day (BID) | INTRAVENOUS | Status: DC

## 2017-11-21 MED ORDER — LORAZEPAM 0.5 MG PO TABS: 0.5000 mg | ORAL_TABLET | Freq: Three times a day (TID) | ORAL | Status: DC | PRN

## 2017-11-21 MED ORDER — ALBUTEROL SULFATE (2.5 MG/3ML) 0.083% IN NEBU
5.0000 mg | INHALATION_SOLUTION | Freq: Once | RESPIRATORY_TRACT | Status: AC
  Administered 2017-11-21: 5 mg via RESPIRATORY_TRACT

## 2017-11-21 MED ORDER — ONDANSETRON HCL 4 MG PO TABS: 4.0000 mg | ORAL_TABLET | Freq: Four times a day (QID) | ORAL | Status: DC | PRN

## 2017-11-21 MED ORDER — POTASSIUM CHLORIDE CRYS ER 20 MEQ PO TBCR
20.0000 meq | EXTENDED_RELEASE_TABLET | Freq: Two times a day (BID) | ORAL | Status: DC
  Administered 2017-11-21 – 2017-11-23 (×3): 20 meq via ORAL
  Filled 2017-11-21 (×4): qty 1

## 2017-11-21 MED ORDER — ONDANSETRON HCL 4 MG/2ML IJ SOLN: 4.0000 mg | Freq: Four times a day (QID) | INTRAMUSCULAR | Status: DC | PRN

## 2017-11-21 MED ORDER — SODIUM CHLORIDE 0.9 % IV SOLN
8.0000 mg/h | INTRAVENOUS | Status: DC
  Administered 2017-11-22 – 2017-11-23 (×3): 8 mg/h via INTRAVENOUS
  Filled 2017-11-21 (×4): qty 80

## 2017-11-21 MED ORDER — LACTULOSE 10 GM/15ML PO SOLN
45.0000 g | Freq: Three times a day (TID) | ORAL | Status: DC
  Administered 2017-11-22: 45 g via ORAL
  Filled 2017-11-21 (×2): qty 90

## 2017-11-21 MED ORDER — ALBUTEROL SULFATE (2.5 MG/3ML) 0.083% IN NEBU
INHALATION_SOLUTION | RESPIRATORY_TRACT | Status: AC
  Filled 2017-11-21: qty 3

## 2017-11-21 MED ORDER — RIFAXIMIN 550 MG PO TABS
550.0000 mg | ORAL_TABLET | Freq: Two times a day (BID) | ORAL | Status: DC
  Administered 2017-11-21 – 2017-11-23 (×3): 550 mg via ORAL
  Filled 2017-11-21 (×4): qty 1

## 2017-11-21 MED ORDER — ALBUTEROL SULFATE (2.5 MG/3ML) 0.083% IN NEBU
INHALATION_SOLUTION | RESPIRATORY_TRACT | Status: AC
  Administered 2017-11-21: 5 mg via RESPIRATORY_TRACT
  Filled 2017-11-21: qty 3

## 2017-11-21 MED ORDER — SODIUM CHLORIDE 0.9 % IV SOLN
10.0000 mL/h | Freq: Once | INTRAVENOUS | Status: AC
Start: 2017-11-21 — End: 2017-11-22
  Administered 2017-11-21: 10 mL/h via INTRAVENOUS

## 2017-11-21 MED ORDER — SODIUM CHLORIDE 0.9 % IV SOLN
80.0000 mg | Freq: Once | INTRAVENOUS | Status: AC
  Administered 2017-11-21: 80 mg via INTRAVENOUS
  Filled 2017-11-21: qty 80

## 2017-11-21 MED ORDER — LACTULOSE 10 GM/15ML PO SOLN: 45.0000 g | Freq: Every day | ORAL | Status: DC

## 2017-11-21 MED ORDER — FUROSEMIDE 10 MG/ML IJ SOLN
40.0000 mg | Freq: Once | INTRAMUSCULAR | Status: AC
  Administered 2017-11-22: 40 mg via INTRAVENOUS
  Filled 2017-11-21: qty 4

## 2017-11-21 MED ORDER — INSULIN ASPART 100 UNIT/ML ~~LOC~~ SOLN
0.0000 [IU] | Freq: Three times a day (TID) | SUBCUTANEOUS | Status: DC
  Administered 2017-11-22: 2 [IU] via SUBCUTANEOUS
  Administered 2017-11-22: 5 [IU] via SUBCUTANEOUS
  Administered 2017-11-22: 14:00:00 2 [IU] via SUBCUTANEOUS
  Administered 2017-11-23 (×2): 3 [IU] via SUBCUTANEOUS
  Filled 2017-11-21 (×5): qty 1

## 2017-11-21 NOTE — ED Triage Notes (Signed)
Pt presents to ED c/o shortness of breath. States hx CHF, DM, hepatic encephalopathy, and anemia requiring multiple blood transfusions.

## 2017-11-21 NOTE — H&P (Addendum)
[facility] NAME: Corey West    MR#:  [id number]  DATE OF BIRTH:  1951-01-18  DATE OF ADMISSION:  11/21/2017  PRIMARY CARE PHYSICIAN: Baxter Hire, MD   REQUESTING/REFERRING PHYSICIAN: Dr Gonzella Lex  CHIEF COMPLAINT:   Chief Complaint  Patient presents with  . Shortness of Breath    HISTORY OF PRESENT ILLNESS:  Corey West  is a 67 y.o. 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  Patient states that he has been feeling weak and lightheaded and short of breath and came in to the hospital for further evaluation.  He states that there is been a virus also going around at work and he has been coughing at night.  He states that he is okay with a blood transfusion at this point.  He states he will only let Dr. Vira Agar be the one to do a endoscopy.  He states he has been a little confused also.  Since he works part-time he takes his lactulose only at night.  Ammonia level still pending.  Hospitalist consulted for further evaluation.  In the ER his hemoglobin was found to be 5.9 and he was guaiac positive brown stool in the emergency room.  PAST MEDICAL HISTORY:   Past Medical History:  Diagnosis Date  . Anemia   . Anxiety    controlled;   . Arthritis   . AVM (arteriovenous malformation) of stomach, acquired with hemorrhage   . Barrett's esophagus   . Chronic kidney disease    renal infufficiency  . Cirrhosis ([place])   . Depression    controlled;   Marland Kitchen Diabetes mellitus without complication ([place])    not controlled, taking insulin but sugar continues to run high;   . Edema   . Esophageal varices ([place])   . GERD (gastroesophageal reflux disease)   . History of hiatal hernia   . Hyperlipidemia   . Hypertension    controlled well;   . 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 Obesity   . Pancytopenia ([place])   . Polyp, stomach    with chronic blood loss  .  Sleep apnea    does not wear a cpap, Medicare would not pay for it   . Venous stasis dermatitis of both lower extremities   . Venous stasis of both lower extremities    with cellulitis    PAST SURGICAL HISTORY:   Past Surgical History:  Procedure Laterality Date  . ESOPHAGOGASTRODUODENOSCOPY N/A 02/09/2015   Procedure: ESOPHAGOGASTRODUODENOSCOPY (EGD);  Surgeon: Manya Silvas, MD;  Location: [facility] ENDOSCOPY;  Service: Endoscopy;  Laterality: N/A;  . ESOPHAGOGASTRODUODENOSCOPY N/A 07/22/2015   Procedure: ESOPHAGOGASTRODUODENOSCOPY (EGD);  Surgeon: Manya Silvas, MD;  Location: [facility] ENDOSCOPY;  Service: Endoscopy;  Laterality: N/A;  . ESOPHAGOGASTRODUODENOSCOPY N/A 06/26/2017   Procedure: ESOPHAGOGASTRODUODENOSCOPY (EGD);  Surgeon: Manya Silvas, MD;  Location: [facility] ENDOSCOPY;  Service: Endoscopy;  Laterality: N/A;  . ESOPHAGOGASTRODUODENOSCOPY N/A 06/27/2017   Procedure: ESOPHAGOGASTRODUODENOSCOPY (EGD);  Surgeon: Manya Silvas, MD;  Location: [facility] ENDOSCOPY;  Service: Endoscopy;  Laterality: N/A;  . ESOPHAGOGASTRODUODENOSCOPY N/A 06/28/2017   Procedure: ESOPHAGOGASTRODUODENOSCOPY (EGD);  Surgeon: Manya Silvas, MD;  Location: [facility] ENDOSCOPY;  Service: Endoscopy;  Laterality: N/A;  . ESOPHAGOGASTRODUODENOSCOPY Bilateral 10/11/2017   Procedure: ESOPHAGOGASTRODUODENOSCOPY (EGD);  Surgeon: Manya Silvas, MD;  Location: [facility] ENDOSCOPY;  Service: Endoscopy;  Laterality: Bilateral;  . ESOPHAGOGASTRODUODENOSCOPY (EGD) WITH PROPOFOL N/A 07/20/2015  Procedure: ESOPHAGOGASTRODUODENOSCOPY (EGD) WITH PROPOFOL;  Surgeon: Manya Silvas, MD;  Location: [facility] ENDOSCOPY;  Service: Endoscopy;  Laterality: N/A;  . ESOPHAGOGASTRODUODENOSCOPY (EGD) WITH PROPOFOL N/A 09/16/2015   Procedure: ESOPHAGOGASTRODUODENOSCOPY (EGD) WITH PROPOFOL;  Surgeon: Manya Silvas, MD;  Location: [facility] ENDOSCOPY;  Service: Endoscopy;  Laterality: N/A;  . ESOPHAGOGASTRODUODENOSCOPY (EGD) WITH PROPOFOL N/A  03/16/2016   Procedure: ESOPHAGOGASTRODUODENOSCOPY (EGD) WITH PROPOFOL;  Surgeon: Manya Silvas, MD;  Location: [facility] ENDOSCOPY;  Service: Endoscopy;  Laterality: N/A;  . ESOPHAGOGASTRODUODENOSCOPY (EGD) WITH PROPOFOL N/A 09/14/2016   Procedure: ESOPHAGOGASTRODUODENOSCOPY (EGD) WITH PROPOFOL;  Surgeon: Manya Silvas, MD;  Location: [facility] ENDOSCOPY;  Service: Endoscopy;  Laterality: N/A;  . ESOPHAGOGASTRODUODENOSCOPY (EGD) WITH PROPOFOL N/A 11/05/2016   Procedure: ESOPHAGOGASTRODUODENOSCOPY (EGD) WITH PROPOFOL;  Surgeon: Manya Silvas, MD;  Location: [facility] ENDOSCOPY;  Service: Endoscopy;  Laterality: N/A;  . ESOPHAGOGASTRODUODENOSCOPY (EGD) WITH PROPOFOL N/A 02/06/2017   Procedure: ESOPHAGOGASTRODUODENOSCOPY (EGD) WITH PROPOFOL;  Surgeon: Manya Silvas, MD;  Location: [facility] ENDOSCOPY;  Service: Endoscopy;  Laterality: N/A;  . ESOPHAGOGASTRODUODENOSCOPY (EGD) WITH PROPOFOL N/A 04/24/2017   Procedure: ESOPHAGOGASTRODUODENOSCOPY (EGD) WITH PROPOFOL;  Surgeon: Manya Silvas, MD;  Location: [facility] ENDOSCOPY;  Service: Endoscopy;  Laterality: N/A;  . ESOPHAGOGASTRODUODENOSCOPY (EGD) WITH PROPOFOL N/A 07/31/2017   Procedure: ESOPHAGOGASTRODUODENOSCOPY (EGD) WITH PROPOFOL;  Surgeon: Manya Silvas, MD;  Location: [facility] ENDOSCOPY;  Service: Endoscopy;  Laterality: N/A;  . TONSILLECTOMY    . TONSILLECTOMY AND ADENOIDECTOMY    . ULNAR NERVE TRANSPOSITION    . UVULOPALATOPHARYNGOPLASTY      SOCIAL HISTORY:   Social History   Tobacco Use  . Smoking status: Current Some Day Smoker    Types: Cigars  . Smokeless tobacco: Never Used  . Tobacco comment: smoke 1 at night occasionally;   Substance Use Topics  . Alcohol use: No    Comment: stopped 12 years ago    FAMILY HISTORY:   Family History  Problem Relation Age of Onset  . Diabetes Other   . Transient ischemic attack Father   . CAD Father     DRUG ALLERGIES:  No Known Allergies  REVIEW OF SYSTEMS:  CONSTITUTIONAL: No fever, chills  or sweats.  Sometimes gets a hot and cold feeling.  Positive for fatigue and weakness.  EYES: No blurred or double vision.  Wears glasses. EARS, NOSE, AND THROAT: No tinnitus or ear pain. No sore throat RESPIRATORY: Positive for cough at night.  Positive for shortness of breath.  No wheezing or hemoptysis.  CARDIOVASCULAR: No chest pain, orthopnea, edema.  GASTROINTESTINAL: No nausea, vomiting, diarrhea or abdominal pain. No blood in bowel movements GENITOURINARY: No dysuria, hematuria.  ENDOCRINE: No polyuria, nocturia,  HEMATOLOGY: No anemia, easy bruising or bleeding SKIN: No rash or lesion. MUSCULOSKELETAL: No joint pain or arthritis.   NEUROLOGIC: No tingling, numbness, weakness.  PSYCHIATRY: No anxiety or depression.   MEDICATIONS AT HOME:   Prior to Admission medications   Medication Sig Start Date End Date Taking? Authorizing Provider  FERROUS SULFATE ER PO Take 325 mg by mouth 2 (two) times daily.     [provider]  furosemide (LASIX) 40 MG tablet Take 40 mg by mouth 2 (two) times daily.    [provider]  Hydrocortisone (GERHARDT'S BUTT CREAM) CREA Apply 1 application topically 2 (two) times daily. 09/05/17   Gouru, Illene Silver, MD  insulin aspart protamine- aspart (NOVOLOG MIX 70/30) (70-30) 100 UNIT/ML injection Inject 0.5 mLs (50 Units total) into the skin 2 (two) times daily with  a meal. Patient taking differently: Inject 65-84 Units into the skin 2 (two) times daily with a meal. 85units-AM/65units-PM 10/26/17   Fritzi Mandes, MD  lactulose (CHRONULAC) 10 GM/15ML solution Take 30 g by mouth 6 (six) times daily.     [provider]  LORazepam (ATIVAN) 0.5 MG tablet Take 0.5 mg by mouth 3 (three) times daily as needed for anxiety.     [provider]  Multiple Vitamin (MULTIVITAMIN) tablet Take 1 tablet by mouth daily.    [provider]  potassium chloride SA (K-DUR,KLOR-CON) 20 MEQ tablet Take 1 tablet (20 mEq total) by mouth daily. Take  BID for 2 days after  That resume once aday Patient taking differently: Take 20 mEq by mouth 2 (two) times daily. Take BID for 2 days after  That resume once aday 10/19/17 01/17/18  Bettey Costa, MD  rifaximin (XIFAXAN) 550 MG TABS tablet Take 550 mg by mouth 2 (two) times daily.    [provider]  traMADol (ULTRAM) 50 MG tablet Take 50 mg by mouth every 8 (eight) hours as needed.     [provider]      VITAL SIGNS:  Blood pressure (!) 127/44, pulse 91, temperature 98.7 F (37.1 C), temperature source Oral, resp. rate (!) 21, height 5\' 9"  (1.753 m), weight (!) 140.6 kg (310 lb), SpO2 100 %.  PHYSICAL EXAMINATION:  GENERAL:  67 y.o.-year-old patient lying flat in the bed with no acute distress.  EYES: Pupils equal, round, reactive to light and accommodation. No scleral icterus. Extraocular muscles intact.  HEENT: Head atraumatic, normocephalic. Oropharynx and nasopharynx clear.  NECK:  Supple, no jugular venous distention. No thyroid enlargement, no tenderness.  LUNGS: Decreased breath sounds bilaterally, no wheezing, rales,rhonchi or crepitation. No use of accessory muscles of respiration.  CARDIOVASCULAR: S1, S2 normal. No murmurs, rubs, or gallops.  ABDOMEN: Soft, nontender, nondistended. Bowel sounds present. No organomegaly or mass.  EXTREMITIES: 3+ pedal edema, no cyanosis, or clubbing.  NEUROLOGIC: Cranial nerves II through XII are intact. Muscle strength 5/5 in all extremities. Sensation intact. Gait not checked.  PSYCHIATRIC: The patient is alert and answers all questions.  SKIN: No rash, lesion, or ulcer.   LABORATORY PANEL:   CBC Recent Labs  Lab 11/21/17 1703  WBC 6.8  HGB 5.9*  HCT 17.5*  PLT 142*   ------------------------------------------------------------------------------------------------------------------  Chemistries  Recent Labs  Lab 11/21/17 1703  NA 137  K 3.2*  CL 104  CO2 26  GLUCOSE 244*  BUN 28*  CREATININE 2.17*  CALCIUM  8.1*   ------------------------------------------------------------------------------------------------------------------  Cardiac Enzymes Recent Labs  Lab 11/21/17 1703  TROPONINI <0.03   ------------------------------------------------------------------------------------------------------------------  RADIOLOGY:  Dg Chest 2 View  Result Date: 11/21/2017 CLINICAL DATA:  Shortness of breath EXAM: CHEST  2 VIEW COMPARISON:  11/08/2017 FINDINGS: Mild cardiomegaly. Stable mediastinal contours. Low volume chest with prominent interstitial markings that is stable. No Kerley lines, effusion, or pneumothorax. Spondylosis with multilevel thoracic ankylosis. IMPRESSION: 1. Interstitial coarsening that could be congestive or bronchitic. 2. Mild cardiomegaly. Electronically Signed   By: Monte Fantasia M.D.   On: 11/21/2017 18:21    EKG:   Atrial fibrillation 91 bpm with a prolonged QTC.  EKG personally reviewed by me  IMPRESSION AND PLAN:   1.  Acute on chronic severe anemia with chronic gastrointestinal bleed secondary to gastric angiectasia's.  Transfuse 2 units of packed red blood cells already ordered by ER physician.  Benefits and risks of transfusion explained.  Lasix before  each unit of blood.  Start Protonix drip.  Serial hemoglobins ordered.  Patient states that he will not have any other gastroenterologist do his endoscopies besides Dr. Vira Agar.  I tried paging Dr. Vira Agar from the ER but he will likely have to be contacted tomorrow morning. 2.  Hepatic encephalopathy.  Ammonia level finally came back at 97.  The patient only takes lactulose at night.  I will have to increase to 3 times a day dosing.  Continue Xifaxan.   3.  Acute on chronic kidney disease.  Monitor after blood transfusion. 4.  Type 2 diabetes mellitus on high-dose 70/30 insulin.  I will put on sliding scale at this point since I will have him on liquid diet today and n.p.o. after midnight. 5.  Liver cirrhosis, hepatic  encephalopathy, thrombocytopenia 6.  Essential hypertension.  Blood pressure stable 7.  Hypokalemia.  Continue replacement of potassium twice daily. 8.  Atrial fibrillation rate control.  Unable to give anticoagulation secondary chronic GI bleed.  EKG reviewed by me, laboratory data reviewed by me, chest x-ray reviewed by me    All the records are reviewed and case discussed with ED provider. Management plans discussed with the patient, and he is in agreement.  CODE STATUS: Full code  TOTAL TIME TAKING CARE OF THIS PATIENT: 50 minutes.    Loletha Grayer M.D on 11/21/2017 at 8:04 PM  Between 7am to 6pm - Pager - [phone]  After 6pm call admission pager [phone]  [facility] Office  [phone]  CC: Primary care physician; Baxter Hire, MD

## 2017-11-21 NOTE — ED Provider Notes (Signed)
[facility] Emergency Department Provider Note  ____________________________________________  Time seen: Approximately 8:04 PM  I have reviewed the triage vital signs and the nursing notes.   HISTORY  Chief Complaint Shortness of Breath   HPI Corey West is a 67 y.o. 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 Patient reports progressively worsening dyspnea on exertion for one week which became severe and constant today. He endorses several episodes of black stools in the last week however he usually attributes that to iron supplementation. No hematemesis or coffee-ground emesis. Yesterday he felt slightly confused which made him worried about his ammonia level. He endorses compliance with the lactulose. He reports he hasn't had any alcohol in over 20 years. He denies abdominal pain, chest pain, fever or chills.  Past Medical History:  Diagnosis Date  . Anemia   . Anxiety    controlled;   . Arthritis   . AVM (arteriovenous malformation) of stomach, acquired with hemorrhage   . Barrett's esophagus   . Chronic kidney disease    renal infufficiency  . Cirrhosis ([place])   . Depression    controlled;   Marland Kitchen Diabetes mellitus without complication ([place])    not controlled, taking insulin but sugar continues to run high;   . Edema   . Esophageal varices ([place])   . GERD (gastroesophageal reflux disease)   . History of hiatal hernia   . Hyperlipidemia   . Hypertension    controlled well;   . 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 Obesity   . Pancytopenia ([place])   . Polyp, stomach    with chronic blood loss  . Sleep apnea    does not wear a cpap, Medicare would not pay for it   . Venous stasis dermatitis of both lower extremities   . Venous stasis of both lower extremities    with cellulitis    Patient Active Problem List   Diagnosis Date Noted  . Hepatic encephalopathy ([place]) 10/18/2017  . Hypokalemia 10/09/2017  . Hyperglycemia 10/09/2017  . Symptomatic anemia 06/26/2017  . Sepsis ([place]) 06/12/2017  . Cellulitis in diabetic foot ([place]) 02/21/2017  . Uncontrolled diabetes mellitus ([place]) 02/21/2017  . Cellulitis 02/21/2017  . GI bleed 07/20/2015  . Hematemesis 02/08/2015  . Cirrhosis ([place]) 02/08/2015  . Esophageal varices ([place]) 02/08/2015  . Type 2 diabetes mellitus ([place]) 02/08/2015  . Gastroesophageal reflux disease 02/08/2015  . Hyperlipidemia 02/08/2015  . Anemia 12/04/2013  . Thrombocytopenia ([place]) 12/04/2013  . Splenomegaly 11/04/2013    Past Surgical History:  Procedure Laterality Date  . ESOPHAGOGASTRODUODENOSCOPY N/A 02/09/2015   Procedure: ESOPHAGOGASTRODUODENOSCOPY (EGD);  Surgeon: Manya Silvas, MD;  Location: [facility] ENDOSCOPY;  Service: Endoscopy;  Laterality: N/A;  . ESOPHAGOGASTRODUODENOSCOPY N/A 07/22/2015   Procedure: ESOPHAGOGASTRODUODENOSCOPY (EGD);  Surgeon: Manya Silvas, MD;  Location: [facility] ENDOSCOPY;  Service: Endoscopy;  Laterality: N/A;  . ESOPHAGOGASTRODUODENOSCOPY N/A 06/26/2017   Procedure: ESOPHAGOGASTRODUODENOSCOPY (EGD);  Surgeon: Manya Silvas, MD;  Location: [facility] ENDOSCOPY;  Service: Endoscopy;  Laterality: N/A;  . ESOPHAGOGASTRODUODENOSCOPY N/A 06/27/2017   Procedure: ESOPHAGOGASTRODUODENOSCOPY (EGD);  Surgeon: Manya Silvas, MD;  Location: [facility] ENDOSCOPY;  Service: Endoscopy;  Laterality: N/A;  . ESOPHAGOGASTRODUODENOSCOPY N/A 06/28/2017   Procedure: ESOPHAGOGASTRODUODENOSCOPY (EGD);  Surgeon: Manya Silvas, MD;  Location: [facility] ENDOSCOPY;  Service: Endoscopy;  Laterality: N/A;  . ESOPHAGOGASTRODUODENOSCOPY Bilateral 10/11/2017   Procedure: ESOPHAGOGASTRODUODENOSCOPY (EGD);  Surgeon: Vira Agar,  Gavin Pound, MD;  Location: [facility] ENDOSCOPY;  Service: Endoscopy;  Laterality: Bilateral;  . ESOPHAGOGASTRODUODENOSCOPY (EGD) WITH PROPOFOL N/A 07/20/2015   Procedure:  ESOPHAGOGASTRODUODENOSCOPY (EGD) WITH PROPOFOL;  Surgeon: Manya Silvas, MD;  Location: [facility] ENDOSCOPY;  Service: Endoscopy;  Laterality: N/A;  . ESOPHAGOGASTRODUODENOSCOPY (EGD) WITH PROPOFOL N/A 09/16/2015   Procedure: ESOPHAGOGASTRODUODENOSCOPY (EGD) WITH PROPOFOL;  Surgeon: Manya Silvas, MD;  Location: [facility] ENDOSCOPY;  Service: Endoscopy;  Laterality: N/A;  . ESOPHAGOGASTRODUODENOSCOPY (EGD) WITH PROPOFOL N/A 03/16/2016   Procedure: ESOPHAGOGASTRODUODENOSCOPY (EGD) WITH PROPOFOL;  Surgeon: Manya Silvas, MD;  Location: [facility] ENDOSCOPY;  Service: Endoscopy;  Laterality: N/A;  . ESOPHAGOGASTRODUODENOSCOPY (EGD) WITH PROPOFOL N/A 09/14/2016   Procedure: ESOPHAGOGASTRODUODENOSCOPY (EGD) WITH PROPOFOL;  Surgeon: Manya Silvas, MD;  Location: [facility] ENDOSCOPY;  Service: Endoscopy;  Laterality: N/A;  . ESOPHAGOGASTRODUODENOSCOPY (EGD) WITH PROPOFOL N/A 11/05/2016   Procedure: ESOPHAGOGASTRODUODENOSCOPY (EGD) WITH PROPOFOL;  Surgeon: Manya Silvas, MD;  Location: [facility] ENDOSCOPY;  Service: Endoscopy;  Laterality: N/A;  . ESOPHAGOGASTRODUODENOSCOPY (EGD) WITH PROPOFOL N/A 02/06/2017   Procedure: ESOPHAGOGASTRODUODENOSCOPY (EGD) WITH PROPOFOL;  Surgeon: Manya Silvas, MD;  Location: [facility] ENDOSCOPY;  Service: Endoscopy;  Laterality: N/A;  . ESOPHAGOGASTRODUODENOSCOPY (EGD) WITH PROPOFOL N/A 04/24/2017   Procedure: ESOPHAGOGASTRODUODENOSCOPY (EGD) WITH PROPOFOL;  Surgeon: Manya Silvas, MD;  Location: [facility] ENDOSCOPY;  Service: Endoscopy;  Laterality: N/A;  . ESOPHAGOGASTRODUODENOSCOPY (EGD) WITH PROPOFOL N/A 07/31/2017   Procedure: ESOPHAGOGASTRODUODENOSCOPY (EGD) WITH PROPOFOL;  Surgeon: Manya Silvas, MD;  Location: [facility] ENDOSCOPY;  Service: Endoscopy;  Laterality: N/A;  . TONSILLECTOMY    . TONSILLECTOMY AND ADENOIDECTOMY    . ULNAR NERVE TRANSPOSITION    . UVULOPALATOPHARYNGOPLASTY      Prior to Admission medications   Medication Sig Start Date End Date Taking? Authorizing  Provider  FERROUS SULFATE ER PO Take 325 mg by mouth 2 (two) times daily.    Yes [provider]  furosemide (LASIX) 40 MG tablet Take 40 mg by mouth 2 (two) times daily.   Yes [provider]  insulin aspart protamine- aspart (NOVOLOG MIX 70/30) (70-30) 100 UNIT/ML injection Inject 0.5 mLs (50 Units total) into the skin 2 (two) times daily with a meal. Patient taking differently: Inject 65-84 Units into the skin 2 (two) times daily with a meal. 85units-AM/65units-PM 10/26/17  Yes Fritzi Mandes, MD  lactulose (CHRONULAC) 10 GM/15ML solution Take 30 g by mouth 6 (six) times daily.    Yes [provider]  LORazepam (ATIVAN) 0.5 MG tablet Take 0.5 mg by mouth 3 (three) times daily as needed for anxiety.    Yes [provider]  Multiple Vitamin (MULTIVITAMIN) tablet Take 1 tablet by mouth daily.   Yes [provider]  potassium chloride SA (K-DUR,KLOR-CON) 20 MEQ tablet Take 1 tablet (20 mEq total) by mouth daily. Take BID for 2 days after  That resume once aday Patient taking differently: Take 20 mEq by mouth 2 (two) times daily. Take BID for 2 days after  That resume once aday 10/19/17 01/17/18 Yes Mody, Sital, MD  rifaximin (XIFAXAN) 550 MG TABS tablet Take 550 mg by mouth 2 (two) times daily.   Yes [provider]  traMADol (ULTRAM) 50 MG tablet Take 50 mg by mouth every 8 (eight) hours as needed.    Yes [provider]  Hydrocortisone (GERHARDT'S BUTT CREAM) CREA Apply 1 application topically 2 (two) times daily. Patient not taking: Reported on 11/21/2017 09/05/17   Nicholes Mango, MD    Allergies Patient has no known  allergies.  Family History  Problem Relation Age of Onset  . Diabetes Other   . Transient ischemic attack Father   . CAD Father     Social History Social History   Tobacco Use  . Smoking status: Current Some Day Smoker    Types: Cigars  . Smokeless tobacco: Never Used  . Tobacco comment: smoke 1 at night  occasionally;   Substance Use Topics  . Alcohol use: No    Comment: stopped 12 years ago  . Drug use: No    Review of Systems  Constitutional: Negative for fever. + confusion Eyes: Negative for visual changes. ENT: Negative for sore throat. Neck: No neck pain  Cardiovascular: Negative for chest pain. Respiratory: + shortness of breath. Gastrointestinal: Negative for abdominal pain, vomiting or diarrhea. Genitourinary: Negative for dysuria. Musculoskeletal: Negative for back pain. Skin: Negative for rash. Neurological: Negative for headaches, weakness or numbness. Psych: No SI or HI  ____________________________________________   PHYSICAL EXAM:  VITAL SIGNS: ED Triage Vitals  Enc Vitals Group     BP 11/21/17 1700 (!) 144/42     Pulse Rate 11/21/17 1700 88     Resp 11/21/17 1700 (!) 22     Temp 11/21/17 1700 98.7 F (37.1 C)     Temp Source 11/21/17 1700 Oral     SpO2 11/21/17 1700 100 %     Weight 11/21/17 1701 (!) 310 lb (140.6 kg)     Height 11/21/17 1701 5\' 9"  (1.753 m)     Head Circumference --      Peak Flow --      Pain Score 11/21/17 1701 0     Pain Loc --      Pain Edu? --      Excl. in [place]? --     Constitutional: Alert and oriented x3. Well appearing and in no apparent distress. HEENT:      Head: Normocephalic and atraumatic.         Eyes: Conjunctivae are normal. Sclera is non-icteric.       Mouth/Throat: Mucous membranes are moist.       Neck: Supple with no signs of meningismus. Cardiovascular: Regular rate and rhythm. No murmurs, gallops, or rubs. 2+ symmetrical distal pulses are present in all extremities. No JVD. Respiratory: Patient looks dyspneic, increased work of breathing, normal sats, lungs are clear  Gastrointestinal: Obese, non tender, and non distended with positive bowel sounds. No rebound or guarding. reducible umbilical hernia  Genitourinary: No CVA tenderness. Rectal exam showing round stool guaiac positive Musculoskeletal: Nontender  with normal range of motion in all extremities. No edema, cyanosis, or erythema of extremities. Neurologic: Normal speech and language. Face is symmetric. Moving all extremities. No gross focal neurologic deficits are appreciated. Skin: Skin is warm, dry and intact. No rash noted. Psychiatric: Mood and affect are normal. Speech and behavior are normal.  ____________________________________________   LABS (all labs ordered are listed, but only abnormal results are displayed)  Labs Reviewed  BASIC METABOLIC PANEL - Abnormal; Notable for the following components:      Result Value   Potassium 3.2 (*)    Glucose, Bld 244 (*)    BUN 28 (*)    Creatinine, Ser 2.17 (*)    Calcium 8.1 (*)    GFR calc non Af Amer 30 (*)    GFR calc Af Amer 35 (*)    All other components within normal limits  CBC - Abnormal; Notable for the following components:   RBC  1.87 (*)    Hemoglobin 5.9 (*)    HCT 17.5 (*)    RDW 16.2 (*)    Platelets 142 (*)    All other components within normal limits  AMMONIA - Abnormal; Notable for the following components:   Ammonia 97 (*)    All other components within normal limits  TROPONIN I  FERRITIN  VITAMIN C16  BASIC METABOLIC PANEL  CBC  PROTIME-INR  TYPE AND SCREEN  PREPARE RBC (CROSSMATCH)   ____________________________________________  EKG  ED ECG REPORT I, Rudene Re, the attending physician, personally viewed and interpreted this ECG.  Normal sinus rhythm, rate of 91, First-degree AV block, prolonged QTC, normal QRS, normal axis, no ST elevations or depressions. ____________________________________________  RADIOLOGY  I have personally reviewed the images performed during this visit and I agree with the Radiologist's read.   Interpretation by Radiologist:  Dg Chest 2 View  Result Date: 11/21/2017 CLINICAL DATA:  Shortness of breath EXAM: CHEST  2 VIEW COMPARISON:  11/08/2017 FINDINGS: Mild cardiomegaly. Stable mediastinal contours. Low  volume chest with prominent interstitial markings that is stable. No Kerley lines, effusion, or pneumothorax. Spondylosis with multilevel thoracic ankylosis. IMPRESSION: 1. Interstitial coarsening that could be congestive or bronchitic. 2. Mild cardiomegaly. Electronically Signed   By: Monte Fantasia M.D.   On: 11/21/2017 18:21     ____________________________________________   PROCEDURES  Procedure(s) performed: None Procedures Critical Care performed:  None ____________________________________________   INITIAL IMPRESSION / ASSESSMENT AND PLAN / ED COURSE  67 y.o. 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 Patient found to have a lower GI bleed with brown stool guaiac positive and acute on chronic severe blood loss anemia with a hemoglobin of 5.9. Also found to have acute on chronic kidney injury. Patient was started on IV fluids, 2 units pRBC have been ordered. Ammonia was checked since patient was complaining of slight confusion and is elevated at 97 concerning for hepatic encephalopathy. Lactulose has been ordered. Patient is to be admitted to the hospitalist service for further management.       As part of my medical decision making, I reviewed the following data within the [facility] notes reviewed and incorporated, Labs reviewed , EKG interpreted , Old chart reviewed, Radiograph reviewed , Discussed with admitting physician , Notes from prior ED visits and [place] Controlled Substance Database    Pertinent labs & imaging results that were available during my care of the patient were reviewed by me and considered in my medical decision making (see chart for details).    ____________________________________________   FINAL CLINICAL IMPRESSION(S) / ED DIAGNOSES  Final diagnoses:  Gastrointestinal hemorrhage, unspecified  gastrointestinal hemorrhage type  Acute on chronic blood loss anemia (HCC)  Severe anemia  Acute kidney injury superimposed on chronic kidney disease ([place])  Hepatic encephalopathy (HCC)      NEW MEDICATIONS STARTED DURING THIS VISIT:  ED Discharge Orders    None       Note:  This document was prepared using Dragon voice recognition software and may include unintentional dictation errors.    Rudene Re, MD 11/21/17 2011

## 2017-11-22 ENCOUNTER — Inpatient Hospital Stay: Payer: Medicare Other

## 2017-11-22 LAB — PREPARE RBC (CROSSMATCH)

## 2017-11-22 LAB — HEMOGLOBIN AND HEMATOCRIT, BLOOD
HCT: 21 % — ABNORMAL LOW (ref 40.0–52.0)
HCT: 25.3 % — ABNORMAL LOW (ref 40.0–52.0)
HEMATOCRIT: 22.5 % — AB (ref 40.0–52.0)
HEMOGLOBIN: 7 g/dL — AB (ref 13.0–18.0)
HEMOGLOBIN: 7.5 g/dL — AB (ref 13.0–18.0)
Hemoglobin: 8.7 g/dL — ABNORMAL LOW (ref 13.0–18.0)

## 2017-11-22 LAB — BASIC METABOLIC PANEL
ANION GAP: 10 (ref 5–15)
BUN: 25 mg/dL — AB (ref 6–20)
CHLORIDE: 105 mmol/L (ref 101–111)
CO2: 26 mmol/L (ref 22–32)
Calcium: 8.2 mg/dL — ABNORMAL LOW (ref 8.9–10.3)
Creatinine, Ser: 2.07 mg/dL — ABNORMAL HIGH (ref 0.61–1.24)
GFR calc Af Amer: 37 mL/min — ABNORMAL LOW (ref >60)
GFR calc non Af Amer: 32 mL/min — ABNORMAL LOW (ref >60)
GLUCOSE: 189 mg/dL — AB (ref 65–99)
POTASSIUM: 3 mmol/L — AB (ref 3.5–5.1)
Sodium: 141 mmol/L (ref 135–145)

## 2017-11-22 LAB — GLUCOSE, CAPILLARY
GLUCOSE-CAPILLARY: 175 mg/dL — AB (ref 65–99)
Glucose-Capillary: 167 mg/dL — ABNORMAL HIGH (ref 65–99)
Glucose-Capillary: 180 mg/dL — ABNORMAL HIGH (ref 65–99)
Glucose-Capillary: 256 mg/dL — ABNORMAL HIGH (ref 65–99)
Glucose-Capillary: 333 mg/dL — ABNORMAL HIGH (ref 65–99)

## 2017-11-22 LAB — CBC
HEMATOCRIT: 23 % — AB (ref 40.0–52.0)
HEMOGLOBIN: 7.7 g/dL — AB (ref 13.0–18.0)
MCH: 30.7 pg (ref 26.0–34.0)
MCHC: 33.4 g/dL (ref 32.0–36.0)
MCV: 92 fL (ref 80.0–100.0)
Platelets: 137 10*3/uL — ABNORMAL LOW (ref 150–440)
RBC: 2.5 MIL/uL — ABNORMAL LOW (ref 4.40–5.90)
RDW: 16.1 % — AB (ref 11.5–14.5)
WBC: 5.9 10*3/uL (ref 3.8–10.6)

## 2017-11-22 LAB — PROTIME-INR
INR: 1.17
Prothrombin Time: 14.8 seconds (ref 11.4–15.2)

## 2017-11-22 LAB — MAGNESIUM: MAGNESIUM: 1.9 mg/dL (ref 1.7–2.4)

## 2017-11-22 LAB — AMMONIA: Ammonia: 46 umol/L — ABNORMAL HIGH (ref 9–35)

## 2017-11-22 LAB — VITAMIN B12: Vitamin B-12: 764 pg/mL (ref 180–914)

## 2017-11-22 MED ORDER — METOCLOPRAMIDE HCL 5 MG/ML IJ SOLN
10.0000 mg | Freq: Four times a day (QID) | INTRAMUSCULAR | Status: AC
  Administered 2017-11-22: 17:00:00 10 mg via INTRAVENOUS
  Filled 2017-11-22: qty 2

## 2017-11-22 MED ORDER — IPRATROPIUM-ALBUTEROL 0.5-2.5 (3) MG/3ML IN SOLN
3.0000 mL | Freq: Four times a day (QID) | RESPIRATORY_TRACT | Status: DC
  Administered 2017-11-22 – 2017-11-23 (×3): 3 mL via RESPIRATORY_TRACT
  Filled 2017-11-22 (×3): qty 3

## 2017-11-22 MED ORDER — POTASSIUM CHLORIDE 10 MEQ/100ML IV SOLN
10.0000 meq | INTRAVENOUS | Status: AC
  Administered 2017-11-22 – 2017-11-23 (×4): 10 meq via INTRAVENOUS
  Filled 2017-11-22: qty 100

## 2017-11-22 MED ORDER — DIPHENHYDRAMINE HCL 25 MG PO CAPS
25.0000 mg | ORAL_CAPSULE | Freq: Once | ORAL | Status: AC
  Administered 2017-11-22: 12:00:00 25 mg via ORAL
  Filled 2017-11-22: qty 1

## 2017-11-22 MED ORDER — INSULIN ASPART PROT & ASPART (70-30 MIX) 100 UNIT/ML ~~LOC~~ SUSP
30.0000 [IU] | Freq: Two times a day (BID) | SUBCUTANEOUS | Status: DC
  Administered 2017-11-23: 30 [IU] via SUBCUTANEOUS
  Filled 2017-11-22 (×3): qty 1

## 2017-11-22 MED ORDER — POTASSIUM CHLORIDE 20 MEQ PO PACK
60.0000 meq | PACK | Freq: Once | ORAL | Status: AC
  Administered 2017-11-22: 17:00:00 60 meq via ORAL

## 2017-11-22 MED ORDER — POTASSIUM CHLORIDE 20 MEQ PO PACK
60.0000 meq | PACK | Freq: Once | ORAL | Status: DC
  Filled 2017-11-22: qty 3

## 2017-11-22 MED ORDER — SODIUM CHLORIDE 0.9 % IV BOLUS (SEPSIS): 250.0000 mL | Freq: Once | INTRAVENOUS | Status: DC

## 2017-11-22 MED ORDER — SODIUM CHLORIDE 0.9 % IV SOLN
Freq: Once | INTRAVENOUS | Status: AC
  Administered 2017-11-22: 12:00:00 via INTRAVENOUS

## 2017-11-22 MED ORDER — ACETAMINOPHEN 325 MG PO TABS
650.0000 mg | ORAL_TABLET | Freq: Once | ORAL | Status: DC
  Filled 2017-11-22: qty 2

## 2017-11-22 MED ORDER — SODIUM CHLORIDE 0.9 % IV SOLN
INTRAVENOUS | Status: DC
  Administered 2017-11-22: 19:00:00 via INTRAVENOUS

## 2017-11-22 MED ORDER — FUROSEMIDE 10 MG/ML IJ SOLN
20.0000 mg | Freq: Every day | INTRAMUSCULAR | Status: DC | PRN
  Administered 2017-11-22 – 2017-11-23 (×2): 20 mg via INTRAVENOUS
  Filled 2017-11-22 (×3): qty 2

## 2017-11-22 MED ORDER — INSULIN ASPART PROT & ASPART (70-30 MIX) 100 UNIT/ML ~~LOC~~ SUSP
15.0000 [IU] | Freq: Once | SUBCUTANEOUS | Status: AC
  Administered 2017-11-22: 15 [IU] via SUBCUTANEOUS

## 2017-11-22 MED ORDER — PROMETHAZINE HCL 25 MG/ML IJ SOLN: 12.5000 mg | Freq: Four times a day (QID) | INTRAMUSCULAR | Status: DC | PRN

## 2017-11-22 NOTE — Plan of Care (Signed)
  Education: Knowledge of General Education information will improve 11/22/2017 1205 - Progressing by Herbie Baltimore, RN   Health Behavior/Discharge Planning: Ability to manage health-related needs will improve 11/22/2017 1205 - Progressing by Herbie Baltimore, RN   Clinical Measurements: Ability to maintain clinical measurements within normal limits will improve 11/22/2017 1205 - Progressing by Herbie Baltimore, RN Will remain free from infection 11/22/2017 1205 - Progressing by Herbie Baltimore, RN Diagnostic test results will improve 11/22/2017 1205 - Progressing by Herbie Baltimore, RN Respiratory complications will improve 11/22/2017 1205 - Progressing by Herbie Baltimore, RN Cardiovascular complication will be avoided 11/22/2017 1205 - Progressing by Herbie Baltimore, RN   Activity: Risk for activity intolerance will decrease 11/22/2017 1205 - Progressing by Herbie Baltimore, RN   Nutrition: Adequate nutrition will be maintained 11/22/2017 1205 - Progressing by Herbie Baltimore, RN   Coping: Level of anxiety will decrease 11/22/2017 1205 - Progressing by Herbie Baltimore, RN   Elimination: Will not experience complications related to bowel motility 11/22/2017 1205 - Progressing by Herbie Baltimore, RN Will not experience complications related to urinary retention 11/22/2017 1205 - Progressing by Herbie Baltimore, RN   Pain Managment: General experience of comfort will improve 11/22/2017 1205 - Progressing by Herbie Baltimore, RN   Safety: Ability to remain free from injury will improve 11/22/2017 1205 - Progressing by Herbie Baltimore, RN   Skin Integrity: Risk for impaired skin integrity will decrease 11/22/2017 1205 - Progressing by Herbie Baltimore, RN

## 2017-11-22 NOTE — Progress Notes (Signed)
Inpatient Diabetes Program Recommendations  AACE/ADA: New Consensus Statement on Inpatient Glycemic Control (2015)  Target Ranges:  Prepandial:   less than 140 mg/dL      Peak postprandial:   less than 180 mg/dL (1-2 hours)      Critically ill patients:  140 - 180 mg/dL   Lab Results  Component Value Date   GLUCAP 167 (H) 11/22/2017   HGBA1C 7.0 (H) 09/04/2017    Review of Glycemic Control  Results for JEFFIE, SPIVACK (MRN [id number]) as of 11/22/2017 11:48  Ref. Range 11/21/2017 20:45 11/21/2017 22:14 11/22/2017 07:43 11/22/2017 11:31  Glucose-Capillary Latest Ref Range: 65 - 99 mg/dL 213 (H) 207 (H) 175 (H) 167 (H)    Diabetes history:Type 2 Outpatient Diabetes medications:Novolog mix 70/30 85 units qam, Novolog 70/30 65units qsupper  Current orders for Inpatient glycemic control: Novolog 0-9 units tid, Novolog 0-5 units qhs  Inpatient Diabetes Program Recommendations: Will need to have basal insulin restarted- if CBG consistently above 180 mg/dl, consider starting  Levemir 10units bid (1/4 home dose)  Gentry Fitz, RN, [place], [place], CDE Diabetes Coordinator Inpatient Diabetes Program  [phone] (Team Pager) [phone] ([place]) 11/22/2017 11:55 AM

## 2017-11-22 NOTE — Consult Note (Signed)
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  He has gotten 2 1/2 units so far.  Feeling better.  He can go home when you think OK.  Would like to get hgb up to 8.5/9 range if possible.  He will see me  At 1pm Monday  To set up EGD.

## 2017-11-22 NOTE — Progress Notes (Signed)
[facility] [place] NAME: Corey West    MR#:  [id number]  DATE OF BIRTH:  31-Aug-1951  SUBJECTIVE:  CHIEF COMPLAINT:   Chief Complaint  Patient presents with  . Shortness of Breath  Patient complaining of nausea, generalized weakness, fatigue, per nursing staff-patient to have endoscopy later today by Corey West per patient's request, hemoglobin continues to drift lower-currently 7, patient had previously refused blood transfusion but is now open to have transfusion done given the okay by his gastroenterologist  REVIEW OF SYSTEMS:  CONSTITUTIONAL: No fever, fatigue or weakness.  EYES: No blurred or double vision.  EARS, NOSE, AND THROAT: No tinnitus or ear pain.  RESPIRATORY: No cough, shortness of breath, wheezing or hemoptysis.  CARDIOVASCULAR: No chest pain, orthopnea, edema.  GASTROINTESTINAL: No nausea, vomiting, diarrhea or abdominal pain.  GENITOURINARY: No dysuria, hematuria.  ENDOCRINE: No polyuria, nocturia,  HEMATOLOGY: No anemia, easy bruising or bleeding SKIN: No rash or lesion. MUSCULOSKELETAL: No joint pain or arthritis.   NEUROLOGIC: No tingling, numbness, weakness.  PSYCHIATRY: No anxiety or depression.   ROS  DRUG ALLERGIES:  No Known Allergies  VITALS:  Blood pressure (!) 142/56, pulse 66, temperature 98.3 F (36.8 C), temperature source Oral, resp. rate 18, height 5\' 9"  (1.753 m), weight (!) 140.6 kg (310 lb), SpO2 100 %.  PHYSICAL EXAMINATION:  GENERAL:  67 y.o.-year-old patient lying in the bed with no acute distress.  EYES: Pupils equal, round, reactive to light and accommodation. No scleral icterus. Extraocular muscles intact.  HEENT: Head atraumatic, normocephalic. Oropharynx and nasopharynx clear.  NECK:  Supple, no jugular venous distention. No thyroid enlargement, no tenderness.  LUNGS: Normal breath sounds bilaterally, no wheezing, rales,rhonchi or crepitation. No use of accessory muscles of respiration.   CARDIOVASCULAR: S1, S2 normal. No murmurs, rubs, or gallops.  ABDOMEN: Soft, nontender, nondistended. Bowel sounds present. No organomegaly or mass.  EXTREMITIES: No pedal edema, cyanosis, or clubbing.  NEUROLOGIC: Cranial nerves II through XII are intact. Muscle strength 5/5 in all extremities. Sensation intact. Gait not checked.  PSYCHIATRIC: The patient is alert and oriented x 3.  SKIN: No obvious rash, lesion, or ulcer.   Physical Exam LABORATORY PANEL:   CBC Recent Labs  Lab 11/22/17 0629 11/22/17 0909  WBC 5.9  --   HGB 7.7* 7.0*  HCT 23.0* 21.0*  PLT 137*  --    ------------------------------------------------------------------------------------------------------------------  Chemistries  Recent Labs  Lab 11/22/17 0629 11/22/17 0843  NA 141  --   K 3.0*  --   CL 105  --   CO2 26  --   GLUCOSE 189*  --   BUN 25*  --   CREATININE 2.07*  --   CALCIUM 8.2*  --   MG  --  1.9   ------------------------------------------------------------------------------------------------------------------  Cardiac Enzymes Recent Labs  Lab 11/21/17 1703  TROPONINI <0.03   ------------------------------------------------------------------------------------------------------------------  RADIOLOGY:  Dg Chest 2 View  Result Date: 11/21/2017 CLINICAL DATA:  Shortness of breath EXAM: CHEST  2 VIEW COMPARISON:  11/08/2017 FINDINGS: Mild cardiomegaly. Stable mediastinal contours. Low volume chest with prominent interstitial markings that is stable. No Kerley lines, effusion, or pneumothorax. Spondylosis with multilevel thoracic ankylosis. IMPRESSION: 1. Interstitial coarsening that could be congestive or bronchitic. 2. Mild cardiomegaly. Electronically Signed   By: Monte Fantasia M.D.   On: 11/21/2017 18:21    ASSESSMENT AND PLAN:  1.  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  Hepatic encephalopathy Resolved Ammonia level 97 on admission Continue lactulose 3 times daily with Xifaxan  3.  Acute on chronic kidney disease Stable Worsening secondary to acute blood loss anemia/hypovolemia Transfuse another 2 units packed red blood cells, avoid nephrotoxic agents, strict INO monitoring, BMP daily, consider nephrology consultation if any acute worsening  4 chronic liver cirrhosis With associated thrombocytopenia and intermittent episodes of hepatic encephalopathy Stable Continue lactulose and Xifaxan  5 chronic diabetes mellitus type 2  Stable  Continue sliding scale insulin with Accu-Cheks per routine  Currently n.p.o. for possible endoscopy   6 acute hypokalemia Replete with p.o. potassium, magnesium levels normal, repeat BMP in the morning  7 chronic benign essential hypertension  Stable on current regiment   8 chronic A. fib  Rate controlled  Anticoagulation contraindicated given acute GI bleeding  9 acute nausea Antiemetics PRN  All the records are reviewed and case discussed with Care Management/Social Workerr. Management plans discussed with the patient, family and they are in agreement.  CODE STATUS: full  TOTAL TIME TAKING CARE OF THIS PATIENT: 45 minutes.     POSSIBLE D/C IN 1-3 DAYS, DEPENDING ON CLINICAL CONDITION.   Corey West M.D on 11/22/2017   Between 7am to 6pm - Pager - [phone]  After 6pm go to [email or web address] - password [email or web address] [facility]  Office  [phone]  CC: Primary care physician; Corey Hire, MD  Note: This  dictation was prepared with Dragon dictation along with smaller phrase technology. Any transcriptional errors that result from this process are unintentional.

## 2017-11-23 DIAGNOSIS — D500 Iron deficiency anemia secondary to blood loss (chronic): Secondary | ICD-10-CM

## 2017-11-23 LAB — PREPARE RBC (CROSSMATCH)

## 2017-11-23 LAB — GLUCOSE, CAPILLARY
Glucose-Capillary: 219 mg/dL — ABNORMAL HIGH (ref 65–99)
Glucose-Capillary: 243 mg/dL — ABNORMAL HIGH (ref 65–99)

## 2017-11-23 LAB — HEMOGLOBIN AND HEMATOCRIT, BLOOD
HCT: 26.6 % — ABNORMAL LOW (ref 40.0–52.0)
Hemoglobin: 9 g/dL — ABNORMAL LOW (ref 13.0–18.0)

## 2017-11-23 LAB — HEMOGLOBIN: HEMOGLOBIN: 7.3 g/dL — AB (ref 13.0–18.0)

## 2017-11-23 MED ORDER — SODIUM CHLORIDE 0.9 % IV SOLN
Freq: Once | INTRAVENOUS | Status: AC
Start: 2017-11-23 — End: 2017-11-23
  Administered 2017-11-23: 10:00:00 via INTRAVENOUS

## 2017-11-23 MED ORDER — IPRATROPIUM-ALBUTEROL 0.5-2.5 (3) MG/3ML IN SOLN: 3.0000 mL | RESPIRATORY_TRACT | Status: DC | PRN

## 2017-11-23 NOTE — Progress Notes (Signed)
Patient's hemoglobin 9.0 after blood transfusion, notified Dr. Darvin Neighbours. Received order to discharge patient. Both IV's taken out, patient with no complaints. Patient verbalized understanding of education.

## 2017-11-23 NOTE — Care Management Note (Signed)
Case Management Note  Patient Details  Name: Corey West MRN: [id number] Date of Birth: 02/04/1951  Subjective/Objective:     Resumption of care for PT, RN with [facility]. Cheryl at [employer] was notified.               Action/Plan:   Expected Discharge Date:  11/23/17               Expected Discharge Plan:     In-House Referral:     Discharge planning Services     Post Acute Care Choice:    Choice offered to:     DME Arranged:    DME Agency:     HH Arranged:    HH Agency:     Status of Service:     If discussed at [employer], dates discussed:    Additional Comments:  Corey Randa A, RN 11/23/2017, 4:27 PM

## 2017-11-23 NOTE — Plan of Care (Signed)
  Education: Knowledge of General Education information will improve 11/23/2017 1048 - Progressing by Herbie Baltimore, RN   Health Behavior/Discharge Planning: Ability to manage health-related needs will improve 11/23/2017 1048 - Progressing by Herbie Baltimore, RN   Clinical Measurements: Ability to maintain clinical measurements within normal limits will improve 11/23/2017 1048 - Progressing by Herbie Baltimore, RN Will remain free from infection 11/23/2017 1048 - Progressing by Herbie Baltimore, RN Diagnostic test results will improve 11/23/2017 1048 - Progressing by Herbie Baltimore, RN Respiratory complications will improve 11/23/2017 1048 - Progressing by Herbie Baltimore, RN Cardiovascular complication will be avoided 11/23/2017 1048 - Progressing by Herbie Baltimore, RN   Activity: Risk for activity intolerance will decrease 11/23/2017 1048 - Progressing by Herbie Baltimore, RN   Nutrition: Adequate nutrition will be maintained 11/23/2017 1048 - Progressing by Herbie Baltimore, RN   Coping: Level of anxiety will decrease 11/23/2017 1048 - Progressing by Herbie Baltimore, RN   Elimination: Will not experience complications related to bowel motility 11/23/2017 1048 - Progressing by Herbie Baltimore, RN Will not experience complications related to urinary retention 11/23/2017 1048 - Progressing by Herbie Baltimore, RN   Pain Managment: General experience of comfort will improve 11/23/2017 1048 - Progressing by Herbie Baltimore, RN   Safety: Ability to remain free from injury will improve 11/23/2017 1048 - Progressing by Herbie Baltimore, RN   Skin Integrity: Risk for impaired skin integrity will decrease 11/23/2017 1048 - Progressing by Herbie Baltimore, RN

## 2017-11-23 NOTE — Discharge Instructions (Signed)
Follow up with Dr. Vira Agar as previously scheduled

## 2017-11-23 NOTE — Progress Notes (Signed)
Vonda Antigua, MD [address], [place], [place], [place], [address] [address], [place], [place], [place], [address] Phone: [phone]  Fax: [phone]   Subjective: Patient is well-known to Dr. Vira Agar, and follows with him in clinic.  Admitted with hemoglobin of around 5.  No evidence of active GI bleeding. Reports formed BM this morning with no blood. Patient states he would only like endoscopies done by his GI physician, Dr. Vira Agar.  Dr. Vira Agar had asked me to check in on the patient over the weekend as I am on call.   Objective: Vital signs in last 24 hours: Vitals:   11/23/17 0740 11/23/17 0824 11/23/17 1022 11/23/17 1055  BP:  (!) 141/52 (!) 155/54 (!) 153/72  Pulse:  85 83 84  Resp:  16 18 18   Temp:  98 F (36.7 C) 98.7 F (37.1 C) 98 F (36.7 C)  TempSrc:  Axillary Oral Axillary  SpO2: 98% 99% 100% 100%  Weight:      Height:       Weight change:   Intake/Output Summary (Last 24 hours) at 11/23/2017 1211 Last data filed at 11/23/2017 0800 Gross per 24 hour  Intake 1937 ml  Output 300 ml  Net 1637 ml     Exam: Cardiac: +S1, +S2, RRR, No edema Pulm: CTA b/l, Normal Resp Effort Abd: Soft, NT/ND, No HSM Skin: Warm, no rashes Neck: Supple, Trachea midline   Lab Results: Lab Results  Component Value Date   WBC 5.9 11/22/2017   HGB 7.3 (L) 11/23/2017   HCT 22.5 (L) 11/22/2017   MCV 92.0 11/22/2017   PLT 137 (L) 11/22/2017   Micro Results: No results found for this or any previous visit (from the past 240 hour(s)). Studies/Results: Dg Chest 2 View  Result Date: 11/22/2017 CLINICAL DATA:  67 y/o  M; shortness of breath. EXAM: CHEST  2 VIEW COMPARISON:  11/21/2017 chest radiograph FINDINGS: Stable mild cardiomegaly given projection and technique. Pulmonary vascular congestion, no consolidation. Probable small right effusion. Bones are unremarkable. IMPRESSION: Stable mild cardiomegaly. Pulmonary vascular congestion. Probable small right  effusion. Electronically Signed   By: Kristine Garbe M.D.   On: 11/22/2017 18:33   Dg Chest 2 View  Result Date: 11/21/2017 CLINICAL DATA:  Shortness of breath EXAM: CHEST  2 VIEW COMPARISON:  11/08/2017 FINDINGS: Mild cardiomegaly. Stable mediastinal contours. Low volume chest with prominent interstitial markings that is stable. No Kerley lines, effusion, or pneumothorax. Spondylosis with multilevel thoracic ankylosis. IMPRESSION: 1. Interstitial coarsening that could be congestive or bronchitic. 2. Mild cardiomegaly. Electronically Signed   By: Monte Fantasia M.D.   On: 11/21/2017 18:21   Medications:  Scheduled Meds: . acetaminophen  650 mg Oral Once  . furosemide  40 mg Oral BID  . insulin aspart  0-5 Units Subcutaneous QHS  . insulin aspart  0-9 Units Subcutaneous TID WC  . insulin aspart protamine- aspart  30 Units Subcutaneous BID WC  . lactulose  45 g Oral TID  . multivitamin with minerals  1 tablet Oral Daily  . [START ON 11/25/2017] pantoprazole  40 mg Intravenous Q12H  . potassium chloride SA  20 mEq Oral BID  . rifaximin  550 mg Oral BID   Continuous Infusions: . pantoprozole (PROTONIX) infusion 8 mg/hr (11/23/17 0016)  . sodium chloride     PRN Meds:.furosemide, ipratropium-albuterol, LORazepam, ondansetron **OR** ondansetron (ZOFRAN) IV, promethazine, traMADol   Assessment: Active Problems:   Anemia    Plan: Patient remains hemodynamically stable at this  time As previously documented, patient only wants endoscopies done by Dr. Vira Agar and pt informed me he would only like his endoscopies done by Dr. Vira Agar. No evidence of active GI bleeding Patient remains hemodynamically stable, and does not have any episodes of GI bleeding, patient can follow-up with Dr. Vira Agar as an inpatient or outpatient on Monday to discuss EGD for his previously known angiectasia was in treatment of the same If Patient has signs of active GI bleeding, or worsening anemia,  requiring blood transfusion would recommend inpatient stay until EGD can be done by on-call GI or Dr. Vira Agar. Continue serial CBCs and transfuse PRN Avoid NSAIDs in this elderly patient Please page on call GI with any signs of active GI bleeding or hemodynamic changes   LOS: 2 days   Vonda Antigua, MD 11/23/2017, 12:11 PM

## 2017-11-24 LAB — TYPE AND SCREEN
ABO/RH(D): A NEG
ANTIBODY SCREEN: NEGATIVE
UNIT DIVISION: 0
Unit division: 0
Unit division: 0
Unit division: 0

## 2017-11-24 LAB — BPAM RBC
BLOOD PRODUCT EXPIRATION DATE: 201902272359
BLOOD PRODUCT EXPIRATION DATE: 201903182359
Blood Product Expiration Date: 201902282359
Blood Product Expiration Date: 201903192359
ISSUE DATE / TIME: 201902220033
ISSUE DATE / TIME: 201902221132
ISSUE DATE / TIME: 201902212101
ISSUE DATE / TIME: 201902231021
UNIT TYPE AND RH: 9500
Unit Type and Rh: 600
Unit Type and Rh: 600
Unit Type and Rh: 9500

## 2017-11-28 NOTE — Discharge Summary (Signed)
[facility] NAME: Corey West    MR#:  [id number]  DATE OF BIRTH:  09/12/1951  DATE OF ADMISSION:  11/21/2017 ADMITTING PHYSICIAN: Loletha Grayer, MD  DATE OF DISCHARGE: 11/23/2017  4:50 PM  PRIMARY CARE PHYSICIAN: Baxter Hire, MD   ADMISSION DIAGNOSIS:  Hepatic encephalopathy ([place]) [K72.90] Severe anemia [D64.9] Gastrointestinal hemorrhage, unspecified gastrointestinal hemorrhage type [K92.2] Acute kidney injury superimposed on chronic kidney disease ([place]) [N17.9, N18.9] Acute on chronic blood loss anemia ([place]) [D60.0]  DISCHARGE DIAGNOSIS:  Active Problems:   Anemia   SECONDARY DIAGNOSIS:   Past Medical History:  Diagnosis Date  . Anemia   . Anxiety    controlled;   . Arthritis   . AVM (arteriovenous malformation) of stomach, acquired with hemorrhage   . Barrett's esophagus   . Chronic kidney disease    renal infufficiency  . Cirrhosis ([place])   . Depression    controlled;   Marland Kitchen Diabetes mellitus without complication ([place])    not controlled, taking insulin but sugar continues to run high;   . Edema   . Esophageal varices ([place])   . GERD (gastroesophageal reflux disease)   . History of hiatal hernia   . Hyperlipidemia   . Hypertension    controlled well;   . 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 Obesity   . Pancytopenia ([place])   . Polyp, stomach    with chronic blood loss  . Sleep apnea    does not wear a cpap, Medicare would not pay for it   . Venous stasis dermatitis of both lower extremities   . Venous stasis of both lower extremities    with cellulitis     ADMITTING HISTORY  HISTORY OF PRESENT ILLNESS:  Corey West  is a 67 y.o. 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  Patient states that he has been feeling weak and lightheaded and short of breath and came in to the hospital for further evaluation.  He states that there is been a virus  also going around at work and he has been coughing at night.  He states that he is okay with a blood transfusion at this point.  He states he will only let Dr. Vira Agar be the one to do a endoscopy.  He states he has been a little confused also.  Since he works part-time he takes his lactulose only at night.  Ammonia level still pending.  Hospitalist consulted for further evaluation.  In the ER his hemoglobin was found to be 5.9 and he was guaiac positive brown stool in the emergency room.  HOSPITAL COURSE:   *Acute blood loss anemia secondary to upper GI bleed from gastric AVMs.  This has been a chronic problem for the patient.  Needs recurrent transfusions.  Seen by Dr. Vira Agar in the hospital.  Advised discharged home after transfusion.  Hemoglobin improved.  Patient has follow-up on Monday with Dr. Vira Agar.  May need EGD in the future.  At this time he feels normal.  Had a normal bowel movement without any blood or melena.  Other comorbidities remained stable.  Discharged home.  CONSULTS OBTAINED:  Treatment Team:  Manya Silvas, MD  DRUG ALLERGIES:  No Known Allergies  DISCHARGE MEDICATIONS:   Allergies as of 11/23/2017   No Known Allergies     Medication List    TAKE these medications   FERROUS SULFATE ER PO  Take 325 mg by mouth 2 (two) times daily.   furosemide 40 MG tablet Commonly known as:  LASIX Take 40 mg by mouth 2 (two) times daily.   Gerhardt's butt cream Crea Apply 1 application topically 2 (two) times daily.   insulin aspart protamine- aspart (70-30) 100 UNIT/ML injection Commonly known as:  NOVOLOG MIX 70/30 Inject 0.5 mLs (50 Units total) into the skin 2 (two) times daily with a meal. What changed:    how much to take  additional instructions   lactulose 10 GM/15ML solution Commonly known as:  CHRONULAC Take 30 g by mouth 6 (six) times daily.   LORazepam 0.5 MG tablet Commonly known as:  ATIVAN Take 0.5 mg by mouth 3 (three) times daily as needed  for anxiety.   multivitamin tablet Take 1 tablet by mouth daily.   potassium chloride SA 20 MEQ tablet Commonly known as:  K-DUR,KLOR-CON Take 1 tablet (20 mEq total) by mouth daily. Take BID for 2 days after  That resume once aday What changed:    when to take this  additional instructions   rifaximin 550 MG Tabs tablet Commonly known as:  XIFAXAN Take 550 mg by mouth 2 (two) times daily.   traMADol 50 MG tablet Commonly known as:  ULTRAM Take 50 mg by mouth every 8 (eight) hours as needed.       Today   VITAL SIGNS:  Blood pressure (!) 136/49, pulse 76, temperature 98.3 F (36.8 C), temperature source Oral, resp. rate 18, height 5\' 9"  (1.753 m), weight (!) 140.6 kg (310 lb), SpO2 100 %.  I/O:  No intake or output data in the 24 hours ending 11/28/17 1344  PHYSICAL EXAMINATION:  Physical Exam  GENERAL:  67 y.o.-year-old patient lying in the bed with no acute distress.  LUNGS: Normal breath sounds bilaterally, no wheezing, rales,rhonchi or crepitation. No use of accessory muscles of respiration.  CARDIOVASCULAR: S1, S2 normal. No murmurs, rubs, or gallops.  ABDOMEN: Soft, non-tender, non-distended. Bowel sounds present. No organomegaly or mass.  NEUROLOGIC: Moves all 4 extremities. PSYCHIATRIC: The patient is alert and oriented x 3.  SKIN: No obvious rash, lesion, or ulcer.   DATA REVIEW:   CBC Recent Labs  Lab 11/22/17 0629  11/23/17 1553  WBC 5.9  --   --   HGB 7.7*   < > 9.0*  HCT 23.0*   < > 26.6*  PLT 137*  --   --    < > = values in this interval not displayed.    Chemistries  Recent Labs  Lab 11/22/17 0629 11/22/17 0843  NA 141  --   K 3.0*  --   CL 105  --   CO2 26  --   GLUCOSE 189*  --   BUN 25*  --   CREATININE 2.07*  --   CALCIUM 8.2*  --   MG  --  1.9    Cardiac Enzymes Recent Labs  Lab 11/21/17 1703  TROPONINI <0.03    Microbiology Results  Results for orders placed or performed during the hospital encounter of 09/03/17   Urine Culture     Status: Abnormal   Collection Time: 09/03/17  3:53 PM  Result Value Ref Range Status   Specimen Description URINE, RANDOM  Final   Special Requests NONE  Final   Culture (A)  Final    30,000 COLONIES/mL ENTEROCOCCUS FAECALIS 10,000 COLONIES/mL MORGANELLA MORGANII    Report Status 09/07/2017 FINAL  Final   Organism  ID, Bacteria ENTEROCOCCUS FAECALIS (A)  Final   Organism ID, Bacteria MORGANELLA MORGANII (A)  Final      Susceptibility   Enterococcus faecalis - MIC*    AMPICILLIN <=2 SENSITIVE Sensitive     LEVOFLOXACIN >=8 RESISTANT Resistant     NITROFURANTOIN <=16 SENSITIVE Sensitive     VANCOMYCIN 1 SENSITIVE Sensitive     * 30,000 COLONIES/mL ENTEROCOCCUS FAECALIS   Morganella morganii - MIC*    AMPICILLIN >=32 RESISTANT Resistant     CEFAZOLIN >=64 RESISTANT Resistant     CEFTRIAXONE <=1 SENSITIVE Sensitive     CIPROFLOXACIN <=0.25 SENSITIVE Sensitive     GENTAMICIN <=1 SENSITIVE Sensitive     IMIPENEM 0.5 SENSITIVE Sensitive     NITROFURANTOIN 128 RESISTANT Resistant     TRIMETH/SULFA <=20 SENSITIVE Sensitive     AMPICILLIN/SULBACTAM >=32 RESISTANT Resistant     PIP/TAZO <=4 SENSITIVE Sensitive     * 10,000 COLONIES/mL MORGANELLA MORGANII  Urine culture     Status: Abnormal   Collection Time: 09/03/17  3:53 PM  Result Value Ref Range Status   Specimen Description URINE, RANDOM  Final   Special Requests NONE  Final   Culture MULTIPLE SPECIES PRESENT, SUGGEST RECOLLECTION (A)  Final   Report Status 09/05/2017 FINAL  Final  Blood culture (routine x 2)     Status: None   Collection Time: 09/03/17  7:53 PM  Result Value Ref Range Status   Specimen Description BLOOD RFOA  Final   Special Requests   Final    BOTTLES DRAWN AEROBIC AND ANAEROBIC Blood Culture adequate volume   Culture NO GROWTH 5 DAYS  Final   Report Status 09/08/2017 FINAL  Final  Blood culture (routine x 2)     Status: None   Collection Time: 09/03/17  7:53 PM  Result Value Ref Range  Status   Specimen Description BLOOD LFOA  Final   Special Requests   Final    BOTTLES DRAWN AEROBIC AND ANAEROBIC Blood Culture adequate volume   Culture NO GROWTH 5 DAYS  Final   Report Status 09/08/2017 FINAL  Final    RADIOLOGY:  No results found.  Follow up with PCP in 1 week.  Management plans discussed with the patient, family and they are in agreement.  CODE STATUS:  Code Status History    Date Active Date Inactive Code Status Order ID Comments User Context   11/21/2017 19:48 11/23/2017 19:58 Full Code [id number]  Loletha Grayer, MD ED   10/23/2017 14:05 10/26/2017 14:02 Full Code [id number]  Bettey Costa, MD Inpatient   10/18/2017 00:58 10/19/2017 18:06 Full Code [id number]  Saundra Shelling, MD Inpatient   10/09/2017 21:39 10/11/2017 18:06 Full Code [id number]  [name], MD Inpatient   09/03/2017 21:24 09/05/2017 15:52 Full Code [id number]  Nicholes Mango, MD Inpatient   06/26/2017 11:19 06/28/2017 17:48 Full Code [id number]  Henreitta Leber, MD Inpatient   02/21/2017 20:07 02/22/2017 18:03 Full Code [id number]  [name], MD Inpatient   07/20/2015 12:45 07/22/2015 22:17 Full Code [id number]  Nicholes Mango, MD Inpatient   02/08/2015 23:31 02/11/2015 19:43 Full Code [id number]  Hower, Aaron Mose, MD ED      TOTAL TIME TAKING CARE OF THIS PATIENT ON DAY OF DISCHARGE: more than 30 minutes.   Leia Alf Othel Dicostanzo M.D on 11/28/2017 at 1:44 PM  Between 7am to 6pm - Pager - [phone]  After 6pm go to [email or web address] - password [email or web address] [facility]  Office  [phone]  CC: Primary  care physician; Baxter Hire, MD  Note: This dictation was prepared with Dragon dictation along with smaller phrase technology. Any transcriptional errors that result from this process are unintentional.

## 2017-12-02 ENCOUNTER — Encounter: Payer: Self-pay | Admitting: Emergency Medicine

## 2017-12-02 ENCOUNTER — Inpatient Hospital Stay
Admission: EM | Admit: 2017-12-02 | Discharge: 2017-12-05 | DRG: 378 | Disposition: A | Discharge status: Home / Self Care | Payer: Medicare Other | Attending: Internal Medicine | Admitting: Internal Medicine

## 2017-12-02 ENCOUNTER — Other Ambulatory Visit: Payer: Self-pay

## 2017-12-02 ENCOUNTER — Emergency Department: Payer: Medicare Other

## 2017-12-02 DIAGNOSIS — I129 Hypertensive chronic kidney disease with stage 1 through stage 4 chronic kidney disease, or unspecified chronic kidney disease: Secondary | ICD-10-CM | POA: Diagnosis present

## 2017-12-02 DIAGNOSIS — G4730 Sleep apnea, unspecified: Secondary | ICD-10-CM | POA: Diagnosis present

## 2017-12-02 DIAGNOSIS — E876 Hypokalemia: Secondary | ICD-10-CM | POA: Diagnosis present

## 2017-12-02 DIAGNOSIS — R011 Cardiac murmur, unspecified: Secondary | ICD-10-CM | POA: Diagnosis present

## 2017-12-02 DIAGNOSIS — I872 Venous insufficiency (chronic) (peripheral): Secondary | ICD-10-CM | POA: Diagnosis present

## 2017-12-02 DIAGNOSIS — Z794 Long term (current) use of insulin: Secondary | ICD-10-CM

## 2017-12-02 DIAGNOSIS — F329 Major depressive disorder, single episode, unspecified: Secondary | ICD-10-CM | POA: Diagnosis present

## 2017-12-02 DIAGNOSIS — Z720 Tobacco use: Secondary | ICD-10-CM

## 2017-12-02 DIAGNOSIS — K2270 Barrett's esophagus without dysplasia: Secondary | ICD-10-CM | POA: Diagnosis present

## 2017-12-02 DIAGNOSIS — D61818 Other pancytopenia: Secondary | ICD-10-CM | POA: Diagnosis present

## 2017-12-02 DIAGNOSIS — K0889 Other specified disorders of teeth and supporting structures: Secondary | ICD-10-CM | POA: Diagnosis present

## 2017-12-02 DIAGNOSIS — N183 Chronic kidney disease, stage 3 (moderate): Secondary | ICD-10-CM | POA: Diagnosis present

## 2017-12-02 DIAGNOSIS — F419 Anxiety disorder, unspecified: Secondary | ICD-10-CM | POA: Diagnosis present

## 2017-12-02 DIAGNOSIS — Z79899 Other long term (current) drug therapy: Secondary | ICD-10-CM

## 2017-12-02 DIAGNOSIS — K219 Gastro-esophageal reflux disease without esophagitis: Secondary | ICD-10-CM | POA: Diagnosis present

## 2017-12-02 DIAGNOSIS — K5521 Angiodysplasia of colon with hemorrhage: Principal | ICD-10-CM | POA: Diagnosis present

## 2017-12-02 DIAGNOSIS — Z6841 Body Mass Index (BMI) 40.0 and over, adult: Secondary | ICD-10-CM | POA: Diagnosis not present

## 2017-12-02 DIAGNOSIS — I8510 Secondary esophageal varices without bleeding: Secondary | ICD-10-CM | POA: Diagnosis present

## 2017-12-02 DIAGNOSIS — T502X5A Adverse effect of carbonic-anhydrase inhibitors, benzothiadiazides and other diuretics, initial encounter: Secondary | ICD-10-CM | POA: Diagnosis present

## 2017-12-02 DIAGNOSIS — Z9089 Acquired absence of other organs: Secondary | ICD-10-CM

## 2017-12-02 DIAGNOSIS — E1122 Type 2 diabetes mellitus with diabetic chronic kidney disease: Secondary | ICD-10-CM | POA: Diagnosis present

## 2017-12-02 DIAGNOSIS — K7210 Chronic hepatic failure without coma: Secondary | ICD-10-CM | POA: Diagnosis present

## 2017-12-02 DIAGNOSIS — D509 Iron deficiency anemia, unspecified: Secondary | ICD-10-CM | POA: Diagnosis not present

## 2017-12-02 DIAGNOSIS — D696 Thrombocytopenia, unspecified: Secondary | ICD-10-CM | POA: Diagnosis not present

## 2017-12-02 DIAGNOSIS — K7460 Unspecified cirrhosis of liver: Secondary | ICD-10-CM | POA: Diagnosis present

## 2017-12-02 DIAGNOSIS — I8500 Esophageal varices without bleeding: Secondary | ICD-10-CM | POA: Diagnosis not present

## 2017-12-02 DIAGNOSIS — K922 Gastrointestinal hemorrhage, unspecified: Secondary | ICD-10-CM | POA: Diagnosis present

## 2017-12-02 DIAGNOSIS — E785 Hyperlipidemia, unspecified: Secondary | ICD-10-CM | POA: Diagnosis present

## 2017-12-02 DIAGNOSIS — Q2733 Arteriovenous malformation of digestive system vessel: Secondary | ICD-10-CM | POA: Diagnosis not present

## 2017-12-02 DIAGNOSIS — Z823 Family history of stroke: Secondary | ICD-10-CM

## 2017-12-02 DIAGNOSIS — Y92230 Patient room in hospital as the place of occurrence of the external cause: Secondary | ICD-10-CM | POA: Diagnosis present

## 2017-12-02 DIAGNOSIS — D500 Iron deficiency anemia secondary to blood loss (chronic): Secondary | ICD-10-CM | POA: Diagnosis present

## 2017-12-02 DIAGNOSIS — Z8719 Personal history of other diseases of the digestive system: Secondary | ICD-10-CM

## 2017-12-02 DIAGNOSIS — K08409 Partial loss of teeth, unspecified cause, unspecified class: Secondary | ICD-10-CM | POA: Diagnosis present

## 2017-12-02 DIAGNOSIS — D649 Anemia, unspecified: Secondary | ICD-10-CM

## 2017-12-02 DIAGNOSIS — Z8249 Family history of ischemic heart disease and other diseases of the circulatory system: Secondary | ICD-10-CM

## 2017-12-02 DIAGNOSIS — Z833 Family history of diabetes mellitus: Secondary | ICD-10-CM

## 2017-12-02 LAB — CBC
HEMATOCRIT: 21.3 % — AB (ref 40.0–52.0)
HEMOGLOBIN: 7.2 g/dL — AB (ref 13.0–18.0)
MCH: 30.8 pg (ref 26.0–34.0)
MCHC: 34 g/dL (ref 32.0–36.0)
MCV: 90.6 fL (ref 80.0–100.0)
Platelets: 150 10*3/uL (ref 150–440)
RBC: 2.34 MIL/uL — AB (ref 4.40–5.90)
RDW: 16.4 % — ABNORMAL HIGH (ref 11.5–14.5)
WBC: 4.9 10*3/uL (ref 3.8–10.6)

## 2017-12-02 LAB — URINALYSIS, COMPLETE (UACMP) WITH MICROSCOPIC
BILIRUBIN URINE: NEGATIVE
Bacteria, UA: NONE SEEN
Glucose, UA: NEGATIVE mg/dL
HGB URINE DIPSTICK: NEGATIVE
KETONES UR: NEGATIVE mg/dL
LEUKOCYTES UA: NEGATIVE
Nitrite: NEGATIVE
Protein, ur: NEGATIVE mg/dL
SPECIFIC GRAVITY, URINE: 1.01 (ref 1.005–1.030)
pH: 6 (ref 5.0–8.0)

## 2017-12-02 LAB — COMPREHENSIVE METABOLIC PANEL
ALBUMIN: 2.6 g/dL — AB (ref 3.5–5.0)
ALK PHOS: 74 U/L (ref 38–126)
ALT: 20 U/L (ref 17–63)
ANION GAP: 10 (ref 5–15)
AST: 35 U/L (ref 15–41)
BILIRUBIN TOTAL: 0.8 mg/dL (ref 0.3–1.2)
BUN: 29 mg/dL — ABNORMAL HIGH (ref 6–20)
CALCIUM: 8.5 mg/dL — AB (ref 8.9–10.3)
CO2: 27 mmol/L (ref 22–32)
Chloride: 97 mmol/L — ABNORMAL LOW (ref 101–111)
Creatinine, Ser: 1.92 mg/dL — ABNORMAL HIGH (ref 0.61–1.24)
GFR calc non Af Amer: 35 mL/min — ABNORMAL LOW (ref >60)
GFR, EST AFRICAN AMERICAN: 41 mL/min — AB (ref >60)
GLUCOSE: 252 mg/dL — AB (ref 65–99)
POTASSIUM: 2.2 mmol/L — AB (ref 3.5–5.1)
SODIUM: 134 mmol/L — AB (ref 135–145)
TOTAL PROTEIN: 6.5 g/dL (ref 6.5–8.1)

## 2017-12-02 LAB — POTASSIUM: POTASSIUM: 2.5 mmol/L — AB (ref 3.5–5.1)

## 2017-12-02 LAB — HEMOGLOBIN AND HEMATOCRIT, BLOOD
HCT: 21.3 % — ABNORMAL LOW (ref 40.0–52.0)
Hemoglobin: 7 g/dL — ABNORMAL LOW (ref 13.0–18.0)

## 2017-12-02 LAB — AMMONIA: AMMONIA: 72 umol/L — AB (ref 9–35)

## 2017-12-02 LAB — MAGNESIUM: MAGNESIUM: 1.9 mg/dL (ref 1.7–2.4)

## 2017-12-02 LAB — TROPONIN I: Troponin I: <0.03 ng/mL (ref <0.03)

## 2017-12-02 LAB — PREPARE RBC (CROSSMATCH)

## 2017-12-02 MED ORDER — SODIUM CHLORIDE 0.9 % IV SOLN
8.0000 mg/h | INTRAVENOUS | Status: DC
  Administered 2017-12-02 – 2017-12-03 (×2): 8 mg/h via INTRAVENOUS
  Filled 2017-12-02 (×2): qty 80

## 2017-12-02 MED ORDER — POTASSIUM CHLORIDE 10 MEQ/100ML IV SOLN
10.0000 meq | Freq: Once | INTRAVENOUS | Status: AC
  Administered 2017-12-02: 10 meq via INTRAVENOUS
  Filled 2017-12-02: qty 100

## 2017-12-02 MED ORDER — ONDANSETRON HCL 4 MG/2ML IJ SOLN: 4.0000 mg | Freq: Four times a day (QID) | INTRAMUSCULAR | Status: DC | PRN

## 2017-12-02 MED ORDER — SODIUM CHLORIDE 0.9 % IV SOLN: Freq: Once | INTRAVENOUS | Status: DC

## 2017-12-02 MED ORDER — PANTOPRAZOLE SODIUM 40 MG IV SOLR: 40.0000 mg | Freq: Two times a day (BID) | INTRAVENOUS | Status: DC

## 2017-12-02 MED ORDER — SODIUM CHLORIDE 0.9 % IV SOLN
1.0000 g | Freq: Once | INTRAVENOUS | Status: AC
  Administered 2017-12-02: 1 g via INTRAVENOUS
  Filled 2017-12-02: qty 10

## 2017-12-02 MED ORDER — ONDANSETRON HCL 4 MG PO TABS: 4.0000 mg | ORAL_TABLET | Freq: Four times a day (QID) | ORAL | Status: DC | PRN

## 2017-12-02 MED ORDER — OCTREOTIDE LOAD VIA INFUSION
50.0000 ug | Freq: Once | INTRAVENOUS | Status: AC
Start: 2017-12-02 — End: 2017-12-02
  Administered 2017-12-02: 50 ug via INTRAVENOUS
  Filled 2017-12-02: qty 25

## 2017-12-02 MED ORDER — POTASSIUM CHLORIDE IN NACL 40-0.9 MEQ/L-% IV SOLN
INTRAVENOUS | Status: DC
  Filled 2017-12-02 (×4): qty 1000

## 2017-12-02 MED ORDER — SODIUM CHLORIDE 0.9 % IV SOLN: 50.0000 ug/h | INTRAVENOUS | Status: DC

## 2017-12-02 MED ORDER — POTASSIUM CHLORIDE CRYS ER 20 MEQ PO TBCR
EXTENDED_RELEASE_TABLET | ORAL | Status: AC
  Administered 2017-12-02: 40 meq
  Filled 2017-12-02: qty 2

## 2017-12-02 MED ORDER — SODIUM CHLORIDE 0.9 % IV SOLN
10.0000 mL/h | Freq: Once | INTRAVENOUS | Status: AC
  Administered 2017-12-02: 10 mL/h via INTRAVENOUS

## 2017-12-02 MED ORDER — OCTREOTIDE ACETATE 500 MCG/ML IJ SOLN
50.0000 ug/h | INTRAMUSCULAR | Status: DC
  Administered 2017-12-02 – 2017-12-03 (×3): 50 ug/h via INTRAVENOUS
  Filled 2017-12-02 (×7): qty 1

## 2017-12-02 MED ORDER — POTASSIUM CHLORIDE IN NACL 20-0.9 MEQ/L-% IV SOLN: INTRAVENOUS | Status: DC

## 2017-12-02 MED ORDER — SODIUM CHLORIDE 0.9 % IV SOLN
80.0000 mg | Freq: Once | INTRAVENOUS | Status: AC
  Administered 2017-12-02: 16:00:00 80 mg via INTRAVENOUS
  Filled 2017-12-02: qty 80

## 2017-12-02 MED ORDER — SODIUM CHLORIDE 0.9 % IV SOLN: 8.0000 mg/h | INTRAVENOUS | Status: DC

## 2017-12-02 NOTE — ED Notes (Signed)
RN attempted to call report. Floor reports they are unable to take report at this time. Staff will call RN back as soon as they are out of a rapid response.

## 2017-12-02 NOTE — ED Notes (Signed)
RN rewrapped pts legs in gauze and ACE wraps. Wounds have been draining yellow serous fluid. PT reports he had them last changed three days ago and dressings were dirty and coated in yellow exudate. No foul odor noted while changing dressing. +3 pitting edema noted to both legs.

## 2017-12-02 NOTE — ED Provider Notes (Signed)
[facility] Emergency Department Provider Note  ____________________________________________  Time seen: Approximately 2:56 PM  I have reviewed the triage vital signs and the nursing notes.   HISTORY  Chief Complaint Shortness of Breath and Weakness   HPI Corey West is a 67 y.o. 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 Patient was admitted to the hospital 10 days ago for severe anemia. He received 2.5 units of blood. He reports that when he left the hospital he "felt like he was 66 years old again". for the last 3-4 days has been getting progressively weaker and complaining of mild to moderate constant shortness of breath and dyspnea with exertion. He has had two episodes of near syncope yesterday and today. Also concerned about his ammonia level since patient reports being slightly confused since yesterday. He reports that his stool is black and he also has several daily episodes of diarrhea since he is on lactulose and PO iron. He endorses compliance with his iron. He is not on any blood thinners. He denies any abdominal pain. He is scheduled to undergo an endoscopy in 4 days with Dr. Vira Agar   Past Medical History:  Diagnosis Date  . Anemia   . Anxiety    controlled;   . Arthritis   . AVM (arteriovenous malformation) of stomach, acquired with hemorrhage   . Barrett's esophagus   . Chronic kidney disease    renal infufficiency  . Cirrhosis ([place])   . Depression    controlled;   Marland Kitchen Diabetes mellitus without complication ([place])    not controlled, taking insulin but sugar continues to run high;   . Edema   . Esophageal varices ([place])   . GERD (gastroesophageal reflux disease)   . History of hiatal hernia   . Hyperlipidemia   . Hypertension    controlled  well;   . 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 Obesity   . Pancytopenia ([place])   . Polyp, stomach    with chronic blood loss  . Sleep apnea    does not wear a cpap, Medicare would not pay for it   . Venous stasis dermatitis of both lower extremities   . Venous stasis of both lower extremities    with cellulitis    Patient Active Problem List   Diagnosis Date Noted  . Hepatic encephalopathy ([place]) 10/18/2017  . Hypokalemia 10/09/2017  . Hyperglycemia 10/09/2017  . Symptomatic anemia 06/26/2017  . Sepsis ([place]) 06/12/2017  . Cellulitis in diabetic foot ([place]) 02/21/2017  . Uncontrolled diabetes mellitus ([place]) 02/21/2017  . Cellulitis 02/21/2017  . GI bleed 07/20/2015  . Hematemesis 02/08/2015  . Cirrhosis ([place]) 02/08/2015  . Esophageal varices ([place]) 02/08/2015  . Type 2 diabetes mellitus ([place]) 02/08/2015  . Gastroesophageal reflux disease 02/08/2015  . Hyperlipidemia 02/08/2015  . Anemia 12/04/2013  . Thrombocytopenia ([place]) 12/04/2013  . Splenomegaly 11/04/2013    Past Surgical History:  Procedure Laterality Date  . ESOPHAGOGASTRODUODENOSCOPY N/A 02/09/2015   Procedure: ESOPHAGOGASTRODUODENOSCOPY (EGD);  Surgeon: Manya Silvas, MD;  Location: [facility] ENDOSCOPY;  Service: Endoscopy;  Laterality: N/A;  . ESOPHAGOGASTRODUODENOSCOPY N/A 07/22/2015   Procedure: ESOPHAGOGASTRODUODENOSCOPY (EGD);  Surgeon: Manya Silvas, MD;  Location: [facility] ENDOSCOPY;  Service: Endoscopy;  Laterality: N/A;  . ESOPHAGOGASTRODUODENOSCOPY N/A 06/26/2017   Procedure: ESOPHAGOGASTRODUODENOSCOPY (EGD);  Surgeon: Manya Silvas, MD;  Location: [facility] ENDOSCOPY;  Service: Endoscopy;  Laterality: N/A;  . ESOPHAGOGASTRODUODENOSCOPY N/A 06/27/2017   Procedure: ESOPHAGOGASTRODUODENOSCOPY (EGD);  Surgeon: Manya Silvas, MD;  Location: [facility] ENDOSCOPY;  Service: Endoscopy;  Laterality: N/A;  . ESOPHAGOGASTRODUODENOSCOPY N/A 06/28/2017   Procedure: ESOPHAGOGASTRODUODENOSCOPY (EGD);  Surgeon: Manya Silvas, MD;   Location: [facility] ENDOSCOPY;  Service: Endoscopy;  Laterality: N/A;  . ESOPHAGOGASTRODUODENOSCOPY Bilateral 10/11/2017   Procedure: ESOPHAGOGASTRODUODENOSCOPY (EGD);  Surgeon: Manya Silvas, MD;  Location: [facility] ENDOSCOPY;  Service: Endoscopy;  Laterality: Bilateral;  . ESOPHAGOGASTRODUODENOSCOPY (EGD) WITH PROPOFOL N/A 07/20/2015   Procedure: ESOPHAGOGASTRODUODENOSCOPY (EGD) WITH PROPOFOL;  Surgeon: Manya Silvas, MD;  Location: [facility] ENDOSCOPY;  Service: Endoscopy;  Laterality: N/A;  . ESOPHAGOGASTRODUODENOSCOPY (EGD) WITH PROPOFOL N/A 09/16/2015   Procedure: ESOPHAGOGASTRODUODENOSCOPY (EGD) WITH PROPOFOL;  Surgeon: Manya Silvas, MD;  Location: [facility] ENDOSCOPY;  Service: Endoscopy;  Laterality: N/A;  . ESOPHAGOGASTRODUODENOSCOPY (EGD) WITH PROPOFOL N/A 03/16/2016   Procedure: ESOPHAGOGASTRODUODENOSCOPY (EGD) WITH PROPOFOL;  Surgeon: Manya Silvas, MD;  Location: [facility] ENDOSCOPY;  Service: Endoscopy;  Laterality: N/A;  . ESOPHAGOGASTRODUODENOSCOPY (EGD) WITH PROPOFOL N/A 09/14/2016   Procedure: ESOPHAGOGASTRODUODENOSCOPY (EGD) WITH PROPOFOL;  Surgeon: Manya Silvas, MD;  Location: [facility] ENDOSCOPY;  Service: Endoscopy;  Laterality: N/A;  . ESOPHAGOGASTRODUODENOSCOPY (EGD) WITH PROPOFOL N/A 11/05/2016   Procedure: ESOPHAGOGASTRODUODENOSCOPY (EGD) WITH PROPOFOL;  Surgeon: Manya Silvas, MD;  Location: [facility] ENDOSCOPY;  Service: Endoscopy;  Laterality: N/A;  . ESOPHAGOGASTRODUODENOSCOPY (EGD) WITH PROPOFOL N/A 02/06/2017   Procedure: ESOPHAGOGASTRODUODENOSCOPY (EGD) WITH PROPOFOL;  Surgeon: Manya Silvas, MD;  Location: [facility] ENDOSCOPY;  Service: Endoscopy;  Laterality: N/A;  . ESOPHAGOGASTRODUODENOSCOPY (EGD) WITH PROPOFOL N/A 04/24/2017   Procedure: ESOPHAGOGASTRODUODENOSCOPY (EGD) WITH PROPOFOL;  Surgeon: Manya Silvas, MD;  Location: [facility] ENDOSCOPY;  Service: Endoscopy;  Laterality: N/A;  . ESOPHAGOGASTRODUODENOSCOPY (EGD) WITH PROPOFOL N/A 07/31/2017   Procedure:  ESOPHAGOGASTRODUODENOSCOPY (EGD) WITH PROPOFOL;  Surgeon: Manya Silvas, MD;  Location: [facility] ENDOSCOPY;  Service: Endoscopy;  Laterality: N/A;  . TONSILLECTOMY    . TONSILLECTOMY AND ADENOIDECTOMY    . ULNAR NERVE TRANSPOSITION    . UVULOPALATOPHARYNGOPLASTY      Prior to Admission medications   Medication Sig Start Date End Date Taking? Authorizing Provider  FERROUS SULFATE ER PO Take 325 mg by mouth 2 (two) times daily.     [provider]  furosemide (LASIX) 40 MG tablet Take 40 mg by mouth 2 (two) times daily.    [provider]  Hydrocortisone (GERHARDT'S BUTT CREAM) CREA Apply 1 application topically 2 (two) times daily. Patient not taking: Reported on 11/21/2017 09/05/17   Nicholes Mango, MD  insulin aspart protamine- aspart (NOVOLOG MIX 70/30) (70-30) 100 UNIT/ML injection Inject 0.5 mLs (50 Units total) into the skin 2 (two) times daily with a meal. Patient taking differently: Inject 65-84 Units into the skin 2 (two) times daily with a meal. 85units-AM/65units-PM 10/26/17   Fritzi Mandes, MD  lactulose (CHRONULAC) 10 GM/15ML solution Take 30 g by mouth 6 (six) times daily.     [provider]  LORazepam (ATIVAN) 0.5 MG tablet Take 0.5 mg by mouth 3 (three) times daily as needed for anxiety.     [provider]  Multiple Vitamin (MULTIVITAMIN) tablet Take 1 tablet by mouth daily.    [provider]  potassium chloride SA (K-DUR,KLOR-CON) 20 MEQ tablet Take 1 tablet (20 mEq total) by mouth daily. Take BID for 2 days after  That resume once aday Patient taking differently: Take 20 mEq by mouth 2 (two) times daily. Take  BID for 2 days after  That resume once aday 10/19/17 01/17/18  Bettey Costa, MD  rifaximin (XIFAXAN) 550 MG TABS tablet Take 550 mg by mouth 2 (two) times daily.    [provider]  traMADol (ULTRAM) 50 MG tablet Take 50 mg by mouth every 8 (eight) hours as needed.     [provider]    Allergies Patient has no  known allergies.  Family History  Problem Relation Age of Onset  . Diabetes Other   . Transient ischemic attack Father   . CAD Father     Social History Social History   Tobacco Use  . Smoking status: Current Some Day Smoker    Types: Cigars  . Smokeless tobacco: Never Used  . Tobacco comment: smoke 1 at night occasionally;   Substance Use Topics  . Alcohol use: No    Comment: stopped 12 years ago  . Drug use: No    Review of Systems  Constitutional: Negative for fever. + generalized weakness and near syncope, confusion Eyes: Negative for visual changes. ENT: Negative for sore throat. Neck: No neck pain  Cardiovascular: Negative for chest pain. Respiratory: Negative for shortness of breath. Gastrointestinal: Negative for abdominal pain, vomiting. + dark stool, diarrhea. Genitourinary: Negative for dysuria. Musculoskeletal: Negative for back pain. Skin: Negative for rash. Neurological: Negative for headaches, weakness or numbness. Psych: No SI or HI  ____________________________________________   PHYSICAL EXAM:  VITAL SIGNS: ED Triage Vitals  Enc Vitals Group     BP 12/02/17 1156 (!) 135/55     Pulse Rate 12/02/17 1156 81     Resp 12/02/17 1156 18     Temp 12/02/17 1156 98.4 F (36.9 C)     Temp Source 12/02/17 1156 Oral     SpO2 12/02/17 1156 100 %     Weight 12/02/17 1206 (!) 314 lb (142.4 kg)     Height 12/02/17 1206 5\' 9"  (1.753 m)     Head Circumference --      Peak Flow --      Pain Score --      Pain Loc --      Pain Edu? --      Excl. in [place]? --     Constitutional: Alert and oriented. Well appearing and in no apparent distress. HEENT:      Head: Normocephalic and atraumatic.         Eyes: Conjunctivae are normal. Sclera is non-icteric.       Mouth/Throat: Mucous membranes are moist.       Neck: Supple with no signs of meningismus. Cardiovascular: Regular rate and rhythm. No murmurs, gallops, or rubs. 2+ symmetrical distal pulses are present  in all extremities. No JVD. Respiratory: Normal respiratory effort. Lungs are clear to auscultation bilaterally. No wheezes, crackles, or rhonchi.  Gastrointestinal: Soft, non tender, and non distended with positive bowel sounds. No rebound or guarding. Genitourinary: No CVA tenderness. rectal exam showing melena which is grossly guaiac positive Musculoskeletal: Nontender with normal range of motion in all extremities. No edema, cyanosis, or erythema of extremities. Neurologic: Normal speech and language. Face is symmetric. Moving all extremities. No gross focal neurologic deficits are appreciated. Skin: Skin is warm, dry and intact. No rash noted. Psychiatric: Mood and affect are normal. Speech and behavior are normal.  ____________________________________________   LABS (all labs ordered are listed, but only abnormal results are displayed)  Labs Reviewed  CBC - Abnormal; Notable for the following components:  Result Value   RBC 2.34 (*)    Hemoglobin 7.2 (*)    HCT 21.3 (*)    RDW 16.4 (*)    All other components within normal limits  COMPREHENSIVE METABOLIC PANEL - Abnormal; Notable for the following components:   Sodium 134 (*)    Potassium 2.2 (*)    Chloride 97 (*)    Glucose, Bld 252 (*)    BUN 29 (*)    Creatinine, Ser 1.92 (*)    Calcium 8.5 (*)    Albumin 2.6 (*)    GFR calc non Af Amer 35 (*)    GFR calc Af Amer 41 (*)    All other components within normal limits  TROPONIN I  URINALYSIS, COMPLETE (UACMP) WITH MICROSCOPIC  AMMONIA  MAGNESIUM  TYPE AND SCREEN  PREPARE RBC (CROSSMATCH)   ____________________________________________  EKG  ED ECG REPORT I, Rudene Re, the attending physician, personally viewed and interpreted this ECG.  Normal sinus rhythm, rate of 81, prolonged QTC, normal QRS and PR intervals, normal axis, no ST elevations or depressions. unchanged from prior ____________________________________________  RADIOLOGY  I have  personally reviewed the images performed during this visit and I agree with the Radiologist's read.   Interpretation by Radiologist:  Dg Chest 2 View  Result Date: 12/02/2017 CLINICAL DATA:  Cough and shortness of Breath EXAM: CHEST  2 VIEW COMPARISON:  11/22/2017 FINDINGS: Cardiac shadow is stable. The lungs are well aerated bilaterally. No focal infiltrate or sizable effusion is seen. Degenerative changes of the thoracic spine are noted. IMPRESSION: No active cardiopulmonary disease. Electronically Signed   By: Inez Catalina M.D.   On: 12/02/2017 12:52     ____________________________________________   PROCEDURES  Procedure(s) performed: None Procedures Critical Care performed: yes  CRITICAL CARE Performed by: Rudene Re  ?  Total critical care time: 30 min  Critical care time was exclusive of separately billable procedures and treating other patients.  Critical care was necessary to treat or prevent imminent or life-threatening deterioration.  Critical care was time spent personally by me on the following activities: development of treatment plan with patient and/or surrogate as well as nursing, discussions with consultants, evaluation of patient's response to treatment, examination of patient, obtaining history from patient or surrogate, ordering and performing treatments and interventions, ordering and review of laboratory studies, ordering and review of radiographic studies, pulse oximetry and re-evaluation of patient's condition.  ____________________________________________   INITIAL IMPRESSION / ASSESSMENT AND PLAN / ED COURSE   67 y.o. 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 patient with melena on exam, hemoglobin is again 7.2 (it was 9 when he was last discharged from  the hospital). patient has a history of AVM and varices. No coffee ground emesis or hematemesis. Vitals are within normal limits. We'll start patient on octreotide, protonic's, and Rocephin for a GI bleed in the cirrhotic. 2 units of blood have been ordered.K of 2.2, by mouth and IV K has been ordered. Patient is to be admitted to the hospitalist service.      As part of my medical decision making, I reviewed the following data within the [facility] notes reviewed and incorporated, Labs reviewed , EKG interpreted , Old EKG reviewed, Old chart reviewed, Discussed with admitting physician , Notes from prior ED visits and [place] Controlled Substance Database  Pertinent labs & imaging results that were available during my care of the patient were reviewed by me and considered in my medical decision making (see chart for details).    ____________________________________________   FINAL CLINICAL IMPRESSION(S) / ED DIAGNOSES  Final diagnoses:  UGIB (upper gastrointestinal bleed)  Severe anemia  Hypokalemia      NEW MEDICATIONS STARTED DURING THIS VISIT:  ED Discharge Orders    None       Note:  This document was prepared using Dragon voice recognition software and may include unintentional dictation errors.    Alfred Levins, [place], MD 12/02/17 [phone]

## 2017-12-02 NOTE — ED Notes (Signed)
Pt taken to bathroom with assistance from Brett,NT. Pt unable to collect urine at this time.

## 2017-12-02 NOTE — Consult Note (Signed)
GI Inpatient Follow-up Note  Patient Identification: Corey West is a 67 y.o. male well known to me with hx of chronic GI bleed, obesity, cirrhosis, cellulitis of lower legs causing sepsis a few months ago.  He was in hospital a week ago and transfused up to 9 range and was discharged home.  He was at work today and felt weak and woozy and went home and came to ER and found to have K level of 2.2 and hgb 7.6 and was admitted to hospital and I will consult on him as he has been my long standing patient.    Subjective:Patient awake, oriented, knows me.  Scheduled Inpatient Medications:  . [START ON 12/06/2017] pantoprazole  40 mg Intravenous Q12H    Continuous Inpatient Infusions:   . 0.9 % NaCl with KCl 20 mEq / L    . cefTRIAXone (ROCEPHIN)  IV    . octreotide  (SANDOSTATIN)    IV infusion 50 mcg/hr (12/02/17 1548)  . pantoprozole (PROTONIX) infusion 8 mg/hr (12/02/17 1555)  . potassium chloride      PRN Inpatient Medications:    Review of Systems:  Constitutional: Weight is stable.  Eyes: No changes in vision. ENT: No oral lesions, sore throat.  GI: see HPI.  Heme/Lymph: No easy bruising.  CV: No chest pain.  GU: No hematuria.  Integumentary: No rashes.  Neuro: No headaches.  Psych: No depression/anxiety.  Endocrine: No heat/cold intolerance.  Allergic/Immunologic: No urticaria.  Resp: No cough, SOB.  Musculoskeletal: No joint swelling.    Physical Examination: BP (!) 143/69   Pulse 73   Temp 98.4 F (36.9 C) (Oral)   Resp 20   Ht 5\' 9"  (1.753 m)   Wt (!) 142.4 kg (314 lb)   SpO2 99%   BMI 46.37 kg/m  Gen: NAD, alert and oriented x 4, very obese. HEENT: PEERLA, EOMI, Neck: supple, no JVD or thyromegaly Chest: CTA bilaterally, no wheezes, crackles, or other adventitious sounds CV: RRR, 1/6 SEM Abd: obese, non tender, no palpable masses but would be difficult to feel any due to massive abdomen. Ext: significant  edema Skin: severe coarse erythematous skin both  legs.   Data: Lab Results  Component Value Date   WBC 4.9 12/02/2017   HGB 7.2 (L) 12/02/2017   HCT 21.3 (L) 12/02/2017   MCV 90.6 12/02/2017   PLT 150 12/02/2017   Recent Labs  Lab 12/02/17 1205  HGB 7.2*   Lab Results  Component Value Date   NA 134 (L) 12/02/2017   K 2.2 (LL) 12/02/2017   CL 97 (L) 12/02/2017   CO2 27 12/02/2017   BUN 29 (H) 12/02/2017   CREATININE 1.92 (H) 12/02/2017   Lab Results  Component Value Date   ALT 20 12/02/2017   AST 35 12/02/2017   ALKPHOS 74 12/02/2017   BILITOT 0.8 12/02/2017   No results for input(s): APTT, INR, PTT in the last 168 hours.  Assessment/Plan: Corey West is a 67 y.o. male with multiple problems well known to me with recurrent GI bleeding, likely slow ooze, hypo kalemia, diabetes, massive obesity, off and on altered mental status, severe swelling and skin damage of lower legs.  Will get iv K and may need Magnesium level in morning.  Will do EGD tomorrow late afternoon due to schedule.  He need a transfusion tonight or in morning.  Recommendations: Transfuse blood, correct electrolytes, monitor his glucose, consider capsule endoscopy, do EGD tomorrow afternoon. Please call with questions or concerns.  [name], MD

## 2017-12-02 NOTE — ED Notes (Signed)
Attempt x 1IV unsuccessful, unable to thread catheter, blood drawn.

## 2017-12-02 NOTE — H&P (Signed)
[place] [facility] [place] NAME: Corey West    MR#:  [id number]  DATE OF BIRTH:  1950-12-09  DATE OF ADMISSION:  12/02/2017  PRIMARY CARE PHYSICIAN: Baxter Hire, MD   REQUESTING/REFERRING PHYSICIAN:   CHIEF COMPLAINT:   Chief Complaint  Patient presents with  . Shortness of Breath  . Weakness    HISTORY OF PRESENT ILLNESS: Corey West  is a 67 y.o. male with a known history of arteriovenous malformations, Barrett's esophagus, chronic kidney disease, cirrhosis of liver, diabetes mellitus type 2, esophageal varices, GERD presented to the emergency room with abdominal discomfort and nausea.  Patient was at work today and felt weak and woozy and came to the emergency room.  He was worked up in the emergency room is found to have potassium of 2.2 hemoglobin was low around 7.6. In the last hospitalization patient was transfused with PRBC IV.  Patient says his stool has been black but has been on iron pills.  He is scheduled for endoscopy in 4 days with gastroenterology DR Vira Agar.  Patient seen by gastroenterology in the emergency room and scheduled for endoscopy in the morning tomorrow.  PAST MEDICAL HISTORY:   Past Medical History:  Diagnosis Date  . Anemia   . Anxiety    controlled;   . Arthritis   . AVM (arteriovenous malformation) of stomach, acquired with hemorrhage   . Barrett's esophagus   . Chronic kidney disease    renal infufficiency  . Cirrhosis ([place])   . Depression    controlled;   Marland Kitchen Diabetes mellitus without complication ([place])    not controlled, taking insulin but sugar continues to run high;   . Edema   . Esophageal varices ([place])   . GERD (gastroesophageal reflux disease)   . History of hiatal hernia   . Hyperlipidemia   . Hypertension    controlled well;   . 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 Obesity   . Pancytopenia ([place])   . Polyp, stomach    with chronic blood loss  . Sleep apnea    does not wear a cpap,  Medicare would not pay for it   . Venous stasis dermatitis of both lower extremities   . Venous stasis of both lower extremities    with cellulitis    PAST SURGICAL HISTORY:  Past Surgical History:  Procedure Laterality Date  . ESOPHAGOGASTRODUODENOSCOPY N/A 02/09/2015   Procedure: ESOPHAGOGASTRODUODENOSCOPY (EGD);  Surgeon: Manya Silvas, MD;  Location[facility] ENDOSCOPY;  Service: Endoscopy;  Laterality: N/A;  . ESOPHAGOGASTRODUODENOSCOPY N/A 07/22/2015   Procedure: ESOPHAGOGASTRODUODENOSCOPY (EGD);  Surgeon: Manya Silvas, MD;  Location: [facility] ENDOSCOPY;  Service: Endoscopy;  Laterality: N/A;  . ESOPHAGOGASTRODUODENOSCOPY N/A 06/26/2017   Procedure: ESOPHAGOGASTRODUODENOSCOPY (EGD);  Surgeon: Manya Silvas, MD;  Location: [facility] ENDOSCOPY;  Service: Endoscopy;  Laterality: N/A;  . ESOPHAGOGASTRODUODENOSCOPY N/A 06/27/2017   Procedure: ESOPHAGOGASTRODUODENOSCOPY (EGD);  Surgeon: Manya Silvas, MD;  Location: [facility] ENDOSCOPY;  Service: Endoscopy;  Laterality: N/A;  . ESOPHAGOGASTRODUODENOSCOPY N/A 06/28/2017   Procedure: ESOPHAGOGASTRODUODENOSCOPY (EGD);  Surgeon: Manya Silvas, MD;  Location: [facility] ENDOSCOPY;  Service: Endoscopy;  Laterality: N/A;  . ESOPHAGOGASTRODUODENOSCOPY Bilateral 10/11/2017   Procedure: ESOPHAGOGASTRODUODENOSCOPY (EGD);  Surgeon: Manya Silvas, MD;  Location: [facility] ENDOSCOPY;  Service: Endoscopy;  Laterality: Bilateral;  . ESOPHAGOGASTRODUODENOSCOPY (EGD) WITH PROPOFOL N/A 07/20/2015   Procedure: ESOPHAGOGASTRODUODENOSCOPY (EGD) WITH PROPOFOL;  Surgeon: Manya Silvas, MD;  Location: [facility] ENDOSCOPY;  Service: Endoscopy;  Laterality: N/A;  .  ESOPHAGOGASTRODUODENOSCOPY (EGD) WITH PROPOFOL N/A 09/16/2015   Procedure: ESOPHAGOGASTRODUODENOSCOPY (EGD) WITH PROPOFOL;  Surgeon: Manya Silvas, MD;  Location: [facility] ENDOSCOPY;  Service: Endoscopy;  Laterality: N/A;  . ESOPHAGOGASTRODUODENOSCOPY (EGD) WITH PROPOFOL N/A 03/16/2016   Procedure:  ESOPHAGOGASTRODUODENOSCOPY (EGD) WITH PROPOFOL;  Surgeon: Manya Silvas, MD;  Location[facility] ENDOSCOPY;  Service: Endoscopy;  Laterality: N/A;  . ESOPHAGOGASTRODUODENOSCOPY (EGD) WITH PROPOFOL N/A 09/14/2016   Procedure: ESOPHAGOGASTRODUODENOSCOPY (EGD) WITH PROPOFOL;  Surgeon: Manya Silvas, MD;  Location: [facility] ENDOSCOPY;  Service: Endoscopy;  Laterality: N/A;  . ESOPHAGOGASTRODUODENOSCOPY (EGD) WITH PROPOFOL N/A 11/05/2016   Procedure: ESOPHAGOGASTRODUODENOSCOPY (EGD) WITH PROPOFOL;  Surgeon: Manya Silvas, MD;  Location: [facility] ENDOSCOPY;  Service: Endoscopy;  Laterality: N/A;  . ESOPHAGOGASTRODUODENOSCOPY (EGD) WITH PROPOFOL N/A 02/06/2017   Procedure: ESOPHAGOGASTRODUODENOSCOPY (EGD) WITH PROPOFOL;  Surgeon: Manya Silvas, MD;  Location: [facility] ENDOSCOPY;  Service: Endoscopy;  Laterality: N/A;  . ESOPHAGOGASTRODUODENOSCOPY (EGD) WITH PROPOFOL N/A 04/24/2017   Procedure: ESOPHAGOGASTRODUODENOSCOPY (EGD) WITH PROPOFOL;  Surgeon: Manya Silvas, MD;  Location: [facility] ENDOSCOPY;  Service: Endoscopy;  Laterality: N/A;  . ESOPHAGOGASTRODUODENOSCOPY (EGD) WITH PROPOFOL N/A 07/31/2017   Procedure: ESOPHAGOGASTRODUODENOSCOPY (EGD) WITH PROPOFOL;  Surgeon: Manya Silvas, MD;  Location: [facility] ENDOSCOPY;  Service: Endoscopy;  Laterality: N/A;  . TONSILLECTOMY    . TONSILLECTOMY AND ADENOIDECTOMY    . ULNAR NERVE TRANSPOSITION    . UVULOPALATOPHARYNGOPLASTY      SOCIAL HISTORY:  Social History   Tobacco Use  . Smoking status: Current Some Day Smoker    Types: Cigars  . Smokeless tobacco: Never Used  . Tobacco comment: smoke 1 at night occasionally;   Substance Use Topics  . Alcohol use: No    Comment: stopped 12 years ago    FAMILY HISTORY:  Family History  Problem Relation Age of Onset  . Diabetes Other   . Transient ischemic attack Father   . CAD Father     DRUG ALLERGIES: No Known Allergies  REVIEW OF SYSTEMS:   CONSTITUTIONAL: No fever, has fatigue and weakness.  EYES:  No blurred or double vision.  EARS, NOSE, AND THROAT: No tinnitus or ear pain.  RESPIRATORY: No cough, shortness of breath, wheezing or hemoptysis.  CARDIOVASCULAR: No chest pain, orthopnea, edema.  GASTROINTESTINAL: Has nausea,  No vomiting,  On and off diarrhea  Mild  abdominal pain.  GENITOURINARY: No dysuria, hematuria.  ENDOCRINE: No polyuria, nocturia,  HEMATOLOGY: No anemia, easy bruising or bleeding SKIN: No rash or lesion. MUSCULOSKELETAL: No joint pain or arthritis.   NEUROLOGIC: No tingling, numbness, weakness.  PSYCHIATRY: No anxiety or depression.   MEDICATIONS AT HOME:  Prior to Admission medications   Medication Sig Start Date End Date Taking? Authorizing Provider  FERROUS SULFATE ER PO Take 325 mg by mouth 2 (two) times daily.    Yes [provider]  furosemide (LASIX) 40 MG tablet Take 40 mg by mouth 2 (two) times daily.   Yes [provider]  insulin aspart protamine- aspart (NOVOLOG MIX 70/30) (70-30) 100 UNIT/ML injection Inject 0.5 mLs (50 Units total) into the skin 2 (two) times daily with a meal. Patient taking differently: Inject 24-84 Units into the skin 2 (two) times daily with a meal. 84units-AM/24units-PM 10/26/17  Yes Fritzi Mandes, MD  lactulose (CHRONULAC) 10 GM/15ML solution Take 30 g by mouth 6 (six) times daily.    Yes [provider]  LORazepam (ATIVAN) 0.5 MG tablet Take 0.5 mg by mouth 3 (three) times daily as needed for anxiety.  Yes [provider]  Multiple Vitamin (MULTIVITAMIN) tablet Take 1 tablet by mouth daily.   Yes [provider]  potassium chloride SA (K-DUR,KLOR-CON) 20 MEQ tablet Take 1 tablet (20 mEq total) by mouth daily. Take BID for 2 days after  That resume once aday Patient taking differently: Take 20 mEq by mouth 2 (two) times daily. Take BID for 2 days after  That resume once aday 10/19/17 01/17/18 Yes Mody, Sital, MD  rifaximin (XIFAXAN) 550 MG TABS tablet Take 550 mg by mouth 2 (two)  times daily.   Yes [provider]  traMADol (ULTRAM) 50 MG tablet Take 50 mg by mouth every 8 (eight) hours as needed.    Yes [provider]  Hydrocortisone (GERHARDT'S BUTT CREAM) CREA Apply 1 application topically 2 (two) times daily. Patient not taking: Reported on 11/21/2017 09/05/17   Nicholes Mango, MD      PHYSICAL EXAMINATION:   VITAL SIGNS: Blood pressure (!) 143/69, pulse 73, temperature 98.4 F (36.9 C), temperature source Oral, resp. rate 20, height 5\' 9"  (1.753 m), weight (!) 142.4 kg (314 lb), SpO2 99 %.  GENERAL:  67 y.o.-year-old patient lying in the bed with no acute distress.  EYES: Pupils equal, round, reactive to light and accommodation. No scleral icterus. Extraocular muscles intact.  HEENT: Head atraumatic, normocephalic. Oropharynx and nasopharynx clear.  Pallor present. NECK:  Supple, no jugular venous distention. No thyroid enlargement, no tenderness.  LUNGS: Normal breath sounds bilaterally, no wheezing, rales,rhonchi or crepitation. No use of accessory muscles of respiration.  CARDIOVASCULAR: S1, S2 normal. No murmurs, rubs, or gallops.  ABDOMEN: Soft, nontender, nondistended. Bowel sounds present. No organomegaly or mass.  EXTREMITIES: edema in lower extremities with bandage NEUROLOGIC: Cranial nerves II through XII are intact. Muscle strength 5/5 in all extremities. Sensation intact. Gait not checked.  PSYCHIATRIC: The patient is alert and oriented x 3.  SKIN: No obvious rash, lesion, or ulcer.   LABORATORY PANEL:   CBC Recent Labs  Lab 12/02/17 1205  WBC 4.9  HGB 7.2*  HCT 21.3*  PLT 150  MCV 90.6  MCH 30.8  MCHC 34.0  RDW 16.4*   ------------------------------------------------------------------------------------------------------------------  Chemistries  Recent Labs  Lab 12/02/17 1205  NA 134*  K 2.2*  CL 97*  CO2 27  GLUCOSE 252*  BUN 29*  CREATININE 1.92*  CALCIUM 8.5*  MG 1.9  AST 35  ALT 20  ALKPHOS 74   BILITOT 0.8   ------------------------------------------------------------------------------------------------------------------ estimated creatinine clearance is 53.9 mL/min (A) (by C-G formula based on SCr of 1.92 mg/dL (H)). ------------------------------------------------------------------------------------------------------------------ No results for input(s): TSH, T4TOTAL, T3FREE, THYROIDAB in the last 72 hours.  Invalid input(s): FREET3   Coagulation profile No results for input(s): INR, PROTIME in the last 168 hours. ------------------------------------------------------------------------------------------------------------------- No results for input(s): DDIMER in the last 72 hours. -------------------------------------------------------------------------------------------------------------------  Cardiac Enzymes Recent Labs  Lab 12/02/17 1205  TROPONINI <0.03   ------------------------------------------------------------------------------------------------------------------ Invalid input(s): POCBNP  ---------------------------------------------------------------------------------------------------------------  Urinalysis    Component Value Date/Time   COLORURINE YELLOW (A) 11/08/2017 0824   APPEARANCEUR CLEAR (A) 11/08/2017 0824   APPEARANCEUR Clear 09/10/2014 0456   LABSPEC 1.008 11/08/2017 0824   LABSPEC 1.016 09/10/2014 0456   PHURINE 7.0 11/08/2017 0824   GLUCOSEU NEGATIVE 11/08/2017 0824   GLUCOSEU Negative 09/10/2014 0456   HGBUR NEGATIVE 11/08/2017 0824   BILIRUBINUR NEGATIVE 11/08/2017 0824   BILIRUBINUR Negative 09/10/2014 [place] 11/08/2017 0824   PROTEINUR NEGATIVE 11/08/2017 0824   NITRITE NEGATIVE 11/08/2017 2993  LEUKOCYTESUR NEGATIVE 11/08/2017 0824   LEUKOCYTESUR Negative 09/10/2014 0456     RADIOLOGY: Dg Chest 2 View  Result Date: 12/02/2017 CLINICAL DATA:  Cough and shortness of Breath EXAM: CHEST  2 VIEW COMPARISON:   11/22/2017 FINDINGS: Cardiac shadow is stable. The lungs are well aerated bilaterally. No focal infiltrate or sizable effusion is seen. Degenerative changes of the thoracic spine are noted. IMPRESSION: No active cardiopulmonary disease. Electronically Signed   By: Inez Catalina M.D.   On: 12/02/2017 12:52    EKG: Orders placed or performed during the hospital encounter of 12/02/17  . EKG 12-Lead  . EKG 12-Lead  . ED EKG  . ED EKG    IMPRESSION AND PLAN: 67 year old male patient with history of AV malformations, Barrett's esophagus, cirrhosis of liver, chronic kidney disease, hyperlipidemia, hypertension presented to the emergency room with generalized weakness and black stool.  Admitting diagnosis 1.  Hypokalemia 2.  Symptomatic anemia 3.  Gastrointestinal bleeding 4.  Cirrhosis of liver 5.  Diabetes mellitus type 2 Treatment plan Admit patient to medical floor N.p.o. after midnight Appreciate gastroenterology consultation Endoscopy tomorrow Monitor hemoglobin hematocrit Transfuse 1 unit PRBC IV Replace potassium Check magnesium level IV Protonix drip and IV octreotide drip.  All the records are reviewed and case discussed with ED provider. Management plans discussed with the patient, family and they are in agreement.  CODE STATUS:FULL CODE Code Status History    Date Active Date Inactive Code Status Order ID Comments User Context   11/21/2017 19:48 11/23/2017 19:58 Full Code [id number]  Loletha Grayer, MD ED   10/23/2017 14:05 10/26/2017 14:02 Full Code [id number]  Bettey Costa, MD Inpatient   10/18/2017 00:58 10/19/2017 18:06 Full Code [id number]  Saundra Shelling, MD Inpatient   10/09/2017 21:39 10/11/2017 18:06 Full Code [id number]  [name], MD Inpatient   09/03/2017 21:24 09/05/2017 15:52 Full Code [id number]  Nicholes Mango, MD Inpatient   06/26/2017 11:19 06/28/2017 17:48 Full Code [id number]  Henreitta Leber, MD Inpatient   02/21/2017 20:07 02/22/2017 18:03 Full Code  [id number]  [name], MD Inpatient   07/20/2015 12:45 07/22/2015 22:17 Full Code [id number]  Nicholes Mango, MD Inpatient   02/08/2015 23:31 02/11/2015 19:43 Full Code [id number]  Hower, Aaron Mose, MD ED       TOTAL TIME TAKING CARE OF THIS PATIENT: 52 minutes.    Saundra Shelling M.D on 12/02/2017 at 5:30 PM  Between 7am to 6pm - Pager - [phone]  After 6pm go to [email or web address] - password [email or web address] [place] [facility]  Office  [phone]  CC: Primary care physician; Baxter Hire, MD

## 2017-12-02 NOTE — ED Triage Notes (Signed)
States has been SOB and weak for a year, states got worse last 4 to 5 months. Denies recent change. States called MD and advised to come here.

## 2017-12-03 ENCOUNTER — Other Ambulatory Visit: Payer: Self-pay

## 2017-12-03 ENCOUNTER — Encounter: Payer: Self-pay | Admitting: Emergency Medicine

## 2017-12-03 LAB — HEMOGLOBIN AND HEMATOCRIT, BLOOD
HCT: 21.6 % — ABNORMAL LOW (ref 40.0–52.0)
HEMATOCRIT: 25 % — AB (ref 40.0–52.0)
HEMOGLOBIN: 8.3 g/dL — AB (ref 13.0–18.0)
Hemoglobin: 7.5 g/dL — ABNORMAL LOW (ref 13.0–18.0)

## 2017-12-03 LAB — BASIC METABOLIC PANEL
Anion gap: 10 (ref 5–15)
BUN: 28 mg/dL — AB (ref 6–20)
CALCIUM: 8.1 mg/dL — AB (ref 8.9–10.3)
CO2: 26 mmol/L (ref 22–32)
CREATININE: 1.91 mg/dL — AB (ref 0.61–1.24)
Chloride: 101 mmol/L (ref 101–111)
GFR, EST AFRICAN AMERICAN: 41 mL/min — AB (ref >60)
GFR, EST NON AFRICAN AMERICAN: 35 mL/min — AB (ref >60)
Glucose, Bld: 150 mg/dL — ABNORMAL HIGH (ref 65–99)
Potassium: 2.4 mmol/L — CL (ref 3.5–5.1)
SODIUM: 137 mmol/L (ref 135–145)

## 2017-12-03 LAB — MAGNESIUM
MAGNESIUM: 2 mg/dL (ref 1.7–2.4)
Magnesium: 1.9 mg/dL (ref 1.7–2.4)

## 2017-12-03 LAB — GLUCOSE, CAPILLARY
Glucose-Capillary: 148 mg/dL — ABNORMAL HIGH (ref 65–99)
Glucose-Capillary: 174 mg/dL — ABNORMAL HIGH (ref 65–99)
Glucose-Capillary: 223 mg/dL — ABNORMAL HIGH (ref 65–99)
Glucose-Capillary: 231 mg/dL — ABNORMAL HIGH (ref 65–99)
Glucose-Capillary: 201 mg/dL — ABNORMAL HIGH (ref 65–99)

## 2017-12-03 LAB — PREPARE RBC (CROSSMATCH)

## 2017-12-03 LAB — POTASSIUM: Potassium: 3.5 mmol/L (ref 3.5–5.1)

## 2017-12-03 MED ORDER — POTASSIUM CHLORIDE 10 MEQ/100ML IV SOLN
10.0000 meq | Freq: Once | INTRAVENOUS | Status: AC
  Administered 2017-12-03: 10 meq via INTRAVENOUS
  Filled 2017-12-03: qty 100

## 2017-12-03 MED ORDER — INSULIN GLARGINE 100 UNIT/ML ~~LOC~~ SOLN
14.0000 [IU] | Freq: Every day | SUBCUTANEOUS | Status: DC
  Administered 2017-12-03 – 2017-12-04 (×2): 14 [IU] via SUBCUTANEOUS
  Filled 2017-12-03 (×3): qty 0.14

## 2017-12-03 MED ORDER — INSULIN ASPART 100 UNIT/ML ~~LOC~~ SOLN: 0.0000 [IU] | Freq: Four times a day (QID) | SUBCUTANEOUS | Status: DC

## 2017-12-03 MED ORDER — SODIUM CHLORIDE 0.9 % IV SOLN
Freq: Once | INTRAVENOUS | Status: AC
  Administered 2017-12-03: 11:00:00 via INTRAVENOUS

## 2017-12-03 MED ORDER — INSULIN ASPART 100 UNIT/ML ~~LOC~~ SOLN
0.0000 [IU] | Freq: Four times a day (QID) | SUBCUTANEOUS | Status: DC
  Administered 2017-12-03: 7 [IU] via SUBCUTANEOUS
  Administered 2017-12-04: 4 [IU] via SUBCUTANEOUS
  Administered 2017-12-04: 11 [IU] via SUBCUTANEOUS
  Administered 2017-12-04 (×2): 3 [IU] via SUBCUTANEOUS
  Administered 2017-12-05: 4 [IU] via SUBCUTANEOUS
  Administered 2017-12-05: 7 [IU] via SUBCUTANEOUS
  Filled 2017-12-03 (×7): qty 1

## 2017-12-03 MED ORDER — POTASSIUM CHLORIDE 10 MEQ/100ML IV SOLN
10.0000 meq | INTRAVENOUS | Status: AC
  Administered 2017-12-03 (×4): 10 meq via INTRAVENOUS
  Filled 2017-12-03 (×4): qty 100

## 2017-12-03 MED ORDER — INSULIN GLARGINE 100 UNIT/ML ~~LOC~~ SOLN
40.0000 [IU] | Freq: Every day | SUBCUTANEOUS | Status: DC
  Filled 2017-12-03: qty 0.4

## 2017-12-03 MED ORDER — SODIUM CHLORIDE 0.9 % IV SOLN
1.0000 g | Freq: Once | INTRAVENOUS | Status: DC
  Filled 2017-12-03: qty 10

## 2017-12-03 MED ORDER — INSULIN ASPART 100 UNIT/ML ~~LOC~~ SOLN: 0.0000 [IU] | Freq: Three times a day (TID) | SUBCUTANEOUS | Status: DC

## 2017-12-03 NOTE — Progress Notes (Signed)
Patient only received 1unit of PRBC overnight.  STAT Hgb drawn this AM.  Dr. Vianne Bulls notified.  She wants to wait to see what Hgb levels are before giving a second unit of blood.

## 2017-12-03 NOTE — Consult Note (Signed)
Patient with low hgb now getting transfusion.  His potassium level was low and he is getting some.  Will postpone EGD to tomorrow morning if his electrolytes are improved.  Check magnesium level also.  He has stool heme positive for blood, dark stool, is on iron also.  Patient well known to me for several years.  He is needing transfusions much more often than before.  I think a capsule endoscopy may be help full to see if any other places in small bowel that might be bleeding.  His last colonoscopy was a few years ago and he may benefit from a repeat procedure.  PE obese pleasant WM in NAD.  Eyes non icteric conjunctive mildly pale. Chest clear anterior fields, heart 1/6 SEM, abd very obese, difficult to palpate organs.  A: recurrent GI bleed, intervals between spells is getting shorter.  I think Heme Onc consult would be a good idea.  Will try EGD tomorrow

## 2017-12-03 NOTE — Progress Notes (Signed)
[place] at [place] NAME: Treon Kehl    MR#:  [id number]  DATE OF BIRTH:  [birth year]  SUBJECTIVE: Admitted yesterday for shortness of breath, weakness.  Patient has history of liver cirrhosis, AV malformations in the stomach gets frequent EGDs by Dr. Vira Agar.  Patient also has history of diabetes mellitus type 2.  Admitted for abdominal discomfort, nausea, felt very weak found to have severe hypokalemia with potassium 2.2, hemoglobin 7.2, patient received 1 unit of blood transfusion yesterday, hemoglobin today morning 7.5 today.  But still has hypokalemia with potassium 2.4.  Patient denies any complaints.  CHIEF COMPLAINT:   Chief Complaint  Patient presents with  . Shortness of Breath  . Weakness    REVIEW OF SYSTEMS:   ROS CONSTITUTIONAL: No fever.  Has fatigue, shortness of breath.Marland Kitchen  EYES: No blurred or double vision.  EARS, NOSE, AND THROAT: No tinnitus or ear pain.  RESPIRATORY: No cough, shortness of breath, wheezing or hemoptysis.  CARDIOVASCULAR: No chest pain, orthopnea, edema.  GASTROINTESTINAL: No nausea, vomiting, diarrhea or abdominal pain.  GENITOURINARY: No dysuria, hematuria.  ENDOCRINE: No polyuria, nocturia,  HEMATOLOGY: No anemia, easy bruising or bleeding SKIN: No rash or lesion. MUSCULOSKELETAL: No joint pain or arthritis.   NEUROLOGIC: No tingling, numbness, weakness.  PSYCHIATRY: No anxiety or depression.   DRUG ALLERGIES:  No Known Allergies  VITALS:  Blood pressure (!) 128/47, pulse 65, temperature 97.7 F (36.5 C), resp. rate 20, height 5\' 9"  (1.753 m), weight (!) 141.5 kg (312 lb), SpO2 100 %.  PHYSICAL EXAMINATION:  GENERAL:  67 y.o.-year-old patient lying in the bed with no acute distress.  EYES: Pupils equal, round, reactive to light and accommodation. No scleral icterus. Extraocular muscles intact.  HEENT: Head atraumatic, normocephalic. Oropharynx and nasopharynx clear.  NECK:  Supple, no  jugular venous distention. No thyroid enlargement, no tenderness.  LUNGS: Normal breath sounds bilaterally, no wheezing, rales,rhonchi or crepitation. No use of accessory muscles of respiration.  CARDIOVASCULAR: S1, S2 normal. No murmurs, rubs, or gallops.  ABDOMEN: Soft, nontender, obese, distended.  Bowel sounds present.   EXTREMITIES: No pedal edema, cyanosis, or clubbing.  NEUROLOGIC: Cranial nerves II through XII are intact. Muscle strength 5/5 in all extremities. Sensation intact. Gait not checked.  PSYCHIATRIC: The patient is alert and oriented x 3.  SKIN: No obvious rash, lesion, or ulcer.    LABORATORY PANEL:   CBC Recent Labs  Lab 12/02/17 1205  12/03/17 0751  WBC 4.9  --   --   HGB 7.2*   < > 7.5*  HCT 21.3*   < > 21.6*  PLT 150  --   --    < > = values in this interval not displayed.   ------------------------------------------------------------------------------------------------------------------  Chemistries  Recent Labs  Lab 12/02/17 1205  12/03/17 0442  NA 134*  --  137  K 2.2*   < > 2.4*  CL 97*  --  101  CO2 27  --  26  GLUCOSE 252*  --  150*  BUN 29*  --  28*  CREATININE 1.92*  --  1.91*  CALCIUM 8.5*  --  8.1*  MG 1.9  --  2.0  AST 35  --   --   ALT 20  --   --   ALKPHOS 74  --   --   BILITOT 0.8  --   --    < > = values in this interval not displayed.   ------------------------------------------------------------------------------------------------------------------  Cardiac Enzymes Recent Labs  Lab 12/02/17 1205  TROPONINI <0.03   ------------------------------------------------------------------------------------------------------------------  RADIOLOGY:  Dg Chest 2 View  Result Date: 12/02/2017 CLINICAL DATA:  Cough and shortness of Breath EXAM: CHEST  2 VIEW COMPARISON:  11/22/2017 FINDINGS: Cardiac shadow is stable. The lungs are well aerated bilaterally. No focal infiltrate or sizable effusion is seen. Degenerative changes of the  thoracic spine are noted. IMPRESSION: No active cardiopulmonary disease. Electronically Signed   By: Inez Catalina M.D.   On: 12/02/2017 12:52    EKG:   Orders placed or performed during the hospital encounter of 12/02/17  . EKG 12-Lead  . EKG 12-Lead  . ED EKG  . ED EKG    ASSESSMENT AND PLAN:   #74.  67 year old male patient with history of AV malformations, liver cirrhosis, chronic kidney disease, hyperlipidemia who gets scheduled EGDs by Dr. Vira Agar almost every month comes in with anemia, generalized weakness.  Patient was here in January, at that time he had EGD. #1 recurrent anemia, patient supposed to have EGD by Dr. Vira Agar so we admitted the patient and the patient received 2 units of packed RBC transfusion.  EGD is canceled today because of hypokalemia.  Dr. Vira Agar recommended hematology consult because of Need for transfusions more often than before. EGD done in January 2019 showed short segment Barrett's esophagus, portal hypertensive gastropathy. Patient may need capsule endoscopy. #2 diabetes mellitus type 2: Patient started on clear liquids because procedure is canceled.  Continue sliding scale insulin with coverage, Lantus dose is decreased as per as per diabetic nurse recommendation. 3.  Severe hypokalemia secondary to diuretics.  Pharmacy consulted for accurate management. 4.  History of liver pancytopenia,: Patient takes rifaximin, lactulose, furosemide at home.  Discontinue IV fluids, resume Lasix from tomorrow. All the records are reviewed and case discussed with Care Management/Social Workerr. Management plans discussed with the patient, family and they are in agreement.  CODE STATUS:full  TOTAL TIME TAKING CARE OF THIS PATIENT: 35 minutes.   POSSIBLE D/C IN 1-2 DAYS, DEPENDING ON CLINICAL CONDITION.   Epifanio Lesches M.D on 12/03/2017 at 4:45 PM  Between 7am to 6pm - Pager - [phone]  After 6pm go to [email or web address] - password [email or web address]  [facility]  Office  [phone]  CC: Primary care physician; Baxter Hire, MD   Note: This dictation was prepared with Dragon dictation along with smaller phrase technology. Any transcriptional errors that result from this process are unintentional.

## 2017-12-03 NOTE — Progress Notes (Signed)
Pharmacy Electrolyte Monitoring Consult:  Pharmacy consulted to assist in monitoring and replacing electrolytes in this 67 y.o. male admitted on 12/02/2017 with Shortness of Breath and Weakness Patient with GI bleed, symptomatic anemia, hypokalemia  Labs:  Sodium (mmol/L)  Date Value  12/03/2017 137  09/14/2014 138   Potassium (mmol/L)  Date Value  12/03/2017 3.5  09/14/2014 3.4 (L)   Magnesium (mg/dL)  Date Value  12/03/2017 1.9  05/11/2014 2.1   Phosphorus (mg/dL)  Date Value  02/10/2015 3.1   Calcium (mg/dL)  Date Value  12/03/2017 8.1 (L)   Calcium, Total (mg/dL)  Date Value  09/14/2014 8.8   Albumin (g/dL)  Date Value  12/02/2017 2.6 (L)  09/11/2014 2.4 (L)    Plan: K: 3.5  Mg: 1.9. Patient is NPO. Will order KCL 71mEq x 1 dose.  Will recheck electrolytes with AM labs.   Pernell Dupre, PharmD, BCPS Clinical Pharmacist 12/03/2017 6:37 PM

## 2017-12-03 NOTE — Progress Notes (Signed)
Inpatient Diabetes Program Recommendations  AACE/ADA: New Consensus Statement on Inpatient Glycemic Control (2015)  Target Ranges:  Prepandial:   less than 140 mg/dL      Peak postprandial:   less than 180 mg/dL (1-2 hours)      Critically ill patients:  140 - 180 mg/dL   Results for HAJIME, ASFAW (MRN [id number]) as of 12/03/2017 09:41  Ref. Range 12/03/2017 06:37  Glucose-Capillary Latest Ref Range: 65 - 99 mg/dL 148 (H)  Results for AMOR, PACKARD (MRN [id number]) as of 12/03/2017 09:41  Ref. Range 12/02/2017 12:05 12/03/2017 04:42  Glucose Latest Ref Range: 65 - 99 mg/dL 252 (H) 150 (H)   Review of Glycemic Control  Diabetes history: DM2 Outpatient Diabetes medications: 70/30 84 units QAM, 70/30 24 units QPM Current orders for Inpatient glycemic control: Lantus 40 units QHS, Novolog 0-20 units Q6H  Inpatient Diabetes Program Recommendations:  Insulin - Basal: Patient is ordered Lantus 40 units QHS. NO insulin given since arriving at the hospital and fasting glucose 148 mg/dl today. Please consider decreasing Lantus to 14 units QHS (based on 141 kg x 0.1 units). Correction (SSI): Patient is currently NPO. Please consider changing frequency of CBGs and Novolog to Q4H.  Thanks, Barnie Alderman, RN, MSN, CDE Diabetes Coordinator Inpatient Diabetes Program [phone] (Team Pager from 8am to 5pm)

## 2017-12-03 NOTE — Care Management (Signed)
Patient currently followed by [facility] nursing for patient lower extremity wounds.. Patient readmitted with low hemoglobin. GI following and will perform EGD today.  has received unit of packed cells.

## 2017-12-03 NOTE — Progress Notes (Signed)
Pharmacy Electrolyte Monitoring Consult:  Pharmacy consulted to assist in monitoring and replacing electrolytes in this 67 y.o. male admitted on 12/02/2017 with Shortness of Breath and Weakness Patient with GI bleed, symptomatic anemia, hypokalemia  Labs:  Sodium (mmol/L)  Date Value  12/03/2017 137  09/14/2014 138   Potassium (mmol/L)  Date Value  12/03/2017 2.4 (LL)  09/14/2014 3.4 (L)   Magnesium (mg/dL)  Date Value  12/03/2017 2.0  05/11/2014 2.1   Phosphorus (mg/dL)  Date Value  02/10/2015 3.1   Calcium (mg/dL)  Date Value  12/03/2017 8.1 (L)   Calcium, Total (mg/dL)  Date Value  09/14/2014 8.8   Albumin (g/dL)  Date Value  12/02/2017 2.6 (L)  09/11/2014 2.4 (L)    Plan: Current orders per MD:   KCL 10 meq IV x 4 (40 meq total) and NS w/ 40 meq KCL @ 75 ml/hr. Will check K+ at 1800 and f/u with am labs.  Chinita Greenland PharmD Clinical Pharmacist 12/03/2017

## 2017-12-04 ENCOUNTER — Encounter: Payer: Self-pay | Admitting: *Deleted

## 2017-12-04 ENCOUNTER — Inpatient Hospital Stay: Payer: Medicare Other | Admitting: Anesthesiology

## 2017-12-04 ENCOUNTER — Encounter
Admission: EM | Disposition: A | Discharge status: Home / Self Care | Payer: Self-pay | Source: Home / Self Care | Attending: Internal Medicine

## 2017-12-04 DIAGNOSIS — I8500 Esophageal varices without bleeding: Secondary | ICD-10-CM

## 2017-12-04 DIAGNOSIS — D509 Iron deficiency anemia, unspecified: Secondary | ICD-10-CM

## 2017-12-04 DIAGNOSIS — Q2733 Arteriovenous malformation of digestive system vessel: Secondary | ICD-10-CM

## 2017-12-04 DIAGNOSIS — D696 Thrombocytopenia, unspecified: Secondary | ICD-10-CM

## 2017-12-04 DIAGNOSIS — E876 Hypokalemia: Secondary | ICD-10-CM

## 2017-12-04 HISTORY — PX: ESOPHAGOGASTRODUODENOSCOPY: SHX5428

## 2017-12-04 LAB — GLUCOSE, CAPILLARY
GLUCOSE-CAPILLARY: 146 mg/dL — AB (ref 65–99)
GLUCOSE-CAPILLARY: 172 mg/dL — AB (ref 65–99)
Glucose-Capillary: 135 mg/dL — ABNORMAL HIGH (ref 65–99)
Glucose-Capillary: 299 mg/dL — ABNORMAL HIGH (ref 65–99)
Glucose-Capillary: 274 mg/dL — ABNORMAL HIGH (ref 65–99)

## 2017-12-04 LAB — BASIC METABOLIC PANEL
ANION GAP: 6 (ref 5–15)
BUN: 25 mg/dL — AB (ref 6–20)
CHLORIDE: 103 mmol/L (ref 101–111)
CO2: 29 mmol/L (ref 22–32)
Calcium: 8.3 mg/dL — ABNORMAL LOW (ref 8.9–10.3)
Creatinine, Ser: 1.81 mg/dL — ABNORMAL HIGH (ref 0.61–1.24)
GFR calc Af Amer: 44 mL/min — ABNORMAL LOW (ref >60)
GFR, EST NON AFRICAN AMERICAN: 38 mL/min — AB (ref >60)
GLUCOSE: 142 mg/dL — AB (ref 65–99)
POTASSIUM: 3 mmol/L — AB (ref 3.5–5.1)
Sodium: 138 mmol/L (ref 135–145)

## 2017-12-04 LAB — TYPE AND SCREEN
ABO/RH(D): A NEG
ANTIBODY SCREEN: NEGATIVE
Unit division: 0
Unit division: 0

## 2017-12-04 LAB — POTASSIUM: POTASSIUM: 3.2 mmol/L — AB (ref 3.5–5.1)

## 2017-12-04 LAB — HEMOGLOBIN AND HEMATOCRIT, BLOOD
HCT: 25.8 % — ABNORMAL LOW (ref 40.0–52.0)
HEMOGLOBIN: 8.7 g/dL — AB (ref 13.0–18.0)

## 2017-12-04 LAB — CBC
HEMATOCRIT: 25 % — AB (ref 40.0–52.0)
HEMOGLOBIN: 8.5 g/dL — AB (ref 13.0–18.0)
MCH: 30.5 pg (ref 26.0–34.0)
MCHC: 33.9 g/dL (ref 32.0–36.0)
MCV: 90.1 fL (ref 80.0–100.0)
PLATELETS: 118 10*3/uL — AB (ref 150–440)
RBC: 2.77 MIL/uL — ABNORMAL LOW (ref 4.40–5.90)
RDW: 16.7 % — ABNORMAL HIGH (ref 11.5–14.5)
WBC: 5.1 10*3/uL (ref 3.8–10.6)

## 2017-12-04 LAB — BPAM RBC
BLOOD PRODUCT EXPIRATION DATE: 201903122359
Blood Product Expiration Date: 201903252359
ISSUE DATE / TIME: 201903042026
ISSUE DATE / TIME: 201903051101
UNIT TYPE AND RH: 9500
Unit Type and Rh: 600

## 2017-12-04 SURGERY — EGD (ESOPHAGOGASTRODUODENOSCOPY)
Anesthesia: General

## 2017-12-04 MED ORDER — LIDOCAINE 2% (20 MG/ML) 5 ML SYRINGE
INTRAMUSCULAR | Status: DC | PRN
  Administered 2017-12-04: 30 mg via INTRAVENOUS

## 2017-12-04 MED ORDER — IPRATROPIUM-ALBUTEROL 0.5-2.5 (3) MG/3ML IN SOLN
RESPIRATORY_TRACT | Status: AC
  Administered 2017-12-04: 3 mL via RESPIRATORY_TRACT
  Filled 2017-12-04: qty 3

## 2017-12-04 MED ORDER — POTASSIUM CHLORIDE 10 MEQ/100ML IV SOLN
10.0000 meq | INTRAVENOUS | Status: AC
  Administered 2017-12-04 (×3): 10 meq via INTRAVENOUS

## 2017-12-04 MED ORDER — LORAZEPAM 2 MG/ML IJ SOLN
0.5000 mg | Freq: Once | INTRAMUSCULAR | Status: AC
Start: 2017-12-04 — End: 2017-12-04
  Administered 2017-12-04: 0.5 mg via INTRAVENOUS
  Filled 2017-12-04: qty 1

## 2017-12-04 MED ORDER — LACTULOSE 10 GM/15ML PO SOLN
30.0000 g | Freq: Two times a day (BID) | ORAL | Status: DC
  Filled 2017-12-04: qty 60

## 2017-12-04 MED ORDER — FENTANYL CITRATE (PF) 100 MCG/2ML IJ SOLN
INTRAMUSCULAR | Status: DC | PRN
  Administered 2017-12-04 (×2): 50 ug via INTRAVENOUS

## 2017-12-04 MED ORDER — PROPOFOL 10 MG/ML IV BOLUS
INTRAVENOUS | Status: DC | PRN
  Administered 2017-12-04: 100 mg via INTRAVENOUS

## 2017-12-04 MED ORDER — FENTANYL CITRATE (PF) 100 MCG/2ML IJ SOLN
INTRAMUSCULAR | Status: AC
  Filled 2017-12-04: qty 2

## 2017-12-04 MED ORDER — POTASSIUM CHLORIDE 10 MEQ/100ML IV SOLN
10.0000 meq | INTRAVENOUS | Status: AC
  Administered 2017-12-04 – 2017-12-05 (×3): 10 meq via INTRAVENOUS
  Filled 2017-12-04 (×3): qty 100

## 2017-12-04 MED ORDER — PROPOFOL 500 MG/50ML IV EMUL
INTRAVENOUS | Status: AC
  Filled 2017-12-04: qty 50

## 2017-12-04 MED ORDER — SPIRONOLACTONE 25 MG PO TABS
25.0000 mg | ORAL_TABLET | Freq: Every day | ORAL | Status: DC
  Administered 2017-12-04 – 2017-12-05 (×2): 25 mg via ORAL
  Filled 2017-12-04 (×2): qty 1

## 2017-12-04 MED ORDER — RIFAXIMIN 550 MG PO TABS
550.0000 mg | ORAL_TABLET | Freq: Two times a day (BID) | ORAL | Status: DC
  Administered 2017-12-04: 550 mg via ORAL
  Filled 2017-12-04 (×4): qty 1

## 2017-12-04 MED ORDER — POLYETHYLENE GLYCOL 3350 17 GM/SCOOP PO POWD
1.0000 | Freq: Once | ORAL | Status: AC
  Administered 2017-12-04: 255 g via ORAL
  Filled 2017-12-04: qty 255

## 2017-12-04 MED ORDER — IPRATROPIUM-ALBUTEROL 0.5-2.5 (3) MG/3ML IN SOLN
3.0000 mL | Freq: Once | RESPIRATORY_TRACT | Status: AC
  Administered 2017-12-04: 3 mL via RESPIRATORY_TRACT

## 2017-12-04 MED ORDER — SODIUM CHLORIDE 0.9 % IV SOLN
Freq: Once | INTRAVENOUS | Status: AC
  Administered 2017-12-04: 10:00:00 via INTRAVENOUS

## 2017-12-04 MED ORDER — LORAZEPAM 0.5 MG PO TABS
0.5000 mg | ORAL_TABLET | Freq: Three times a day (TID) | ORAL | Status: DC | PRN
  Administered 2017-12-04: 0.5 mg via ORAL
  Filled 2017-12-04 (×2): qty 1

## 2017-12-04 MED ORDER — PROPOFOL 500 MG/50ML IV EMUL
INTRAVENOUS | Status: DC | PRN
  Administered 2017-12-04: 160 ug/kg/min via INTRAVENOUS

## 2017-12-04 MED ORDER — PROPOFOL 10 MG/ML IV BOLUS
INTRAVENOUS | Status: AC
  Filled 2017-12-04: qty 20

## 2017-12-04 MED ORDER — LIDOCAINE HCL (PF) 2 % IJ SOLN
INTRAMUSCULAR | Status: AC
  Filled 2017-12-04: qty 10

## 2017-12-04 MED ORDER — SODIUM CHLORIDE 0.9 % IV SOLN
400.0000 mg | Freq: Once | INTRAVENOUS | Status: AC
  Administered 2017-12-04: 400 mg via INTRAVENOUS
  Filled 2017-12-04: qty 20

## 2017-12-04 MED ORDER — POTASSIUM CHLORIDE 10 MEQ/100ML IV SOLN
10.0000 meq | INTRAVENOUS | Status: AC
  Administered 2017-12-04: 10 meq via INTRAVENOUS
  Filled 2017-12-04 (×4): qty 100

## 2017-12-04 NOTE — Consult Note (Signed)
[facility]  [phone] [phone]  ID: Corey West OB: August 15, 1951  MR#: [id number]  WUX#:[id number]  Patient Care Team: Baxter Hire, MD as PCP - General (Internal Medicine)  CHIEF COMPLAINT: Iron deficiency anemia, thrombocytopenia.  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  He was last evaluated in the cancer center greater than 3 years ago.  Patient was recently admitted to the hospital with worsening shortness of breath and weakness.  He is found to have a decreased hemoglobin of 7.6 and was given 1 unit of packed red blood cells.  He also was found to have severe hypokalemia.  Currently, patient feels improved but not back to his baseline.  He has no neurologic complaints.  He denies any recent fevers or illnesses.  He has a fair appetite, but denies weight loss.  He denies any chest pain or hemoptysis.  He denies any nausea, vomiting, constipation, or diarrhea.  He admits to black stools, but is unclear if this is blood or secondary to his iron supplementation.  He has no urinary complaints.  Patient otherwise feels well and offers no further specific complaints.  REVIEW OF SYSTEMS:   Review of Systems  Constitutional: Positive for malaise/fatigue. Negative for fever and weight loss.  Respiratory: Positive for shortness of breath. Negative for cough and hemoptysis.   Cardiovascular: Negative.  Negative for chest pain and leg swelling.  Gastrointestinal: Positive for abdominal pain and melena. Negative for blood in stool.  Genitourinary: Negative.   Musculoskeletal: Negative.   Skin: Negative.  Negative for rash.  Neurological: Positive for weakness.  Psychiatric/Behavioral: Negative.  The patient is not nervous/anxious.     As per HPI. Otherwise, a complete review of systems is  negative.  PAST MEDICAL HISTORY: Past Medical History:  Diagnosis Date  . Anemia   . Anxiety    controlled;   . Arthritis   . AVM (arteriovenous malformation) of stomach, acquired with hemorrhage   . Barrett's esophagus   . Chronic kidney disease    renal infufficiency  . Cirrhosis ([place])   . Depression    controlled;   Marland Kitchen Diabetes mellitus without complication ([place])    not controlled, taking insulin but sugar continues to run high;   . Edema   . Esophageal varices ([place])   . GERD (gastroesophageal reflux disease)   . History of hiatal hernia   . Hyperlipidemia   . Hypertension    controlled well;   . Nephropathy, diabetic ([place])   . Obesity   . Pancytopenia ([place])   . Polyp, stomach    with chronic blood loss  . Sleep apnea    does not wear a cpap, Medicare would not pay for it   . Venous stasis dermatitis of both lower extremities   . Venous stasis of both lower extremities    with cellulitis    PAST SURGICAL HISTORY: Past Surgical History:  Procedure Laterality Date  . ESOPHAGOGASTRODUODENOSCOPY N/A 02/09/2015   Procedure: ESOPHAGOGASTRODUODENOSCOPY (EGD);  Surgeon: Manya Silvas, MD;  Location: [facility] ENDOSCOPY;  Service: Endoscopy;  Laterality: N/A;  . ESOPHAGOGASTRODUODENOSCOPY N/A 07/22/2015   Procedure: ESOPHAGOGASTRODUODENOSCOPY (EGD);  Surgeon: Manya Silvas, MD;  Location: [facility] ENDOSCOPY;  Service: Endoscopy;  Laterality: N/A;  . ESOPHAGOGASTRODUODENOSCOPY N/A 06/26/2017   Procedure: ESOPHAGOGASTRODUODENOSCOPY (EGD);  Surgeon: Manya Silvas, MD;  Location: [facility] ENDOSCOPY;  Service: Endoscopy;  Laterality: N/A;  . ESOPHAGOGASTRODUODENOSCOPY N/A 06/27/2017   Procedure: ESOPHAGOGASTRODUODENOSCOPY (EGD);  Surgeon: Manya Silvas, MD;  Location: [facility] ENDOSCOPY;  Service: Endoscopy;  Laterality: N/A;  . ESOPHAGOGASTRODUODENOSCOPY N/A 06/28/2017   Procedure: ESOPHAGOGASTRODUODENOSCOPY (EGD);  Surgeon: Manya Silvas, MD;  Location: [facility] ENDOSCOPY;  Service:  Endoscopy;  Laterality: N/A;  . ESOPHAGOGASTRODUODENOSCOPY Bilateral 10/11/2017   Procedure: ESOPHAGOGASTRODUODENOSCOPY (EGD);  Surgeon: Manya Silvas, MD;  Location: [facility] ENDOSCOPY;  Service: Endoscopy;  Laterality: Bilateral;  . ESOPHAGOGASTRODUODENOSCOPY (EGD) WITH PROPOFOL N/A 07/20/2015   Procedure: ESOPHAGOGASTRODUODENOSCOPY (EGD) WITH PROPOFOL;  Surgeon: Manya Silvas, MD;  Location: [facility] ENDOSCOPY;  Service: Endoscopy;  Laterality: N/A;  . ESOPHAGOGASTRODUODENOSCOPY (EGD) WITH PROPOFOL N/A 09/16/2015   Procedure: ESOPHAGOGASTRODUODENOSCOPY (EGD) WITH PROPOFOL;  Surgeon: Manya Silvas, MD;  Location: [facility] ENDOSCOPY;  Service: Endoscopy;  Laterality: N/A;  . ESOPHAGOGASTRODUODENOSCOPY (EGD) WITH PROPOFOL N/A 03/16/2016   Procedure: ESOPHAGOGASTRODUODENOSCOPY (EGD) WITH PROPOFOL;  Surgeon: Manya Silvas, MD;  Location: [facility] ENDOSCOPY;  Service: Endoscopy;  Laterality: N/A;  . ESOPHAGOGASTRODUODENOSCOPY (EGD) WITH PROPOFOL N/A 09/14/2016   Procedure: ESOPHAGOGASTRODUODENOSCOPY (EGD) WITH PROPOFOL;  Surgeon: Manya Silvas, MD;  Location: [facility] ENDOSCOPY;  Service: Endoscopy;  Laterality: N/A;  . ESOPHAGOGASTRODUODENOSCOPY (EGD) WITH PROPOFOL N/A 11/05/2016   Procedure: ESOPHAGOGASTRODUODENOSCOPY (EGD) WITH PROPOFOL;  Surgeon: Manya Silvas, MD;  Location: [facility] ENDOSCOPY;  Service: Endoscopy;  Laterality: N/A;  . ESOPHAGOGASTRODUODENOSCOPY (EGD) WITH PROPOFOL N/A 02/06/2017   Procedure: ESOPHAGOGASTRODUODENOSCOPY (EGD) WITH PROPOFOL;  Surgeon: Manya Silvas, MD;  Location: [facility] ENDOSCOPY;  Service: Endoscopy;  Laterality: N/A;  . ESOPHAGOGASTRODUODENOSCOPY (EGD) WITH PROPOFOL N/A 04/24/2017   Procedure: ESOPHAGOGASTRODUODENOSCOPY (EGD) WITH PROPOFOL;  Surgeon: Manya Silvas, MD;  Location: [facility] ENDOSCOPY;  Service: Endoscopy;  Laterality: N/A;  . ESOPHAGOGASTRODUODENOSCOPY (EGD) WITH PROPOFOL N/A 07/31/2017   Procedure: ESOPHAGOGASTRODUODENOSCOPY (EGD) WITH PROPOFOL;  Surgeon:  Manya Silvas, MD;  Location: [facility] ENDOSCOPY;  Service: Endoscopy;  Laterality: N/A;  . TONSILLECTOMY    . TONSILLECTOMY AND ADENOIDECTOMY    . ULNAR NERVE TRANSPOSITION    . UVULOPALATOPHARYNGOPLASTY      FAMILY HISTORY: Family History  Problem Relation Age of Onset  . Diabetes Other   . Transient ischemic attack Father   . CAD Father     ADVANCED DIRECTIVES (Y/N):  '@ADVDIR'$ @  HEALTH MAINTENANCE: Social History   Tobacco Use  . Smoking status: Current Some Day Smoker    Types: Cigars  . Smokeless tobacco: Never Used  . Tobacco comment: smoke 1 at night occasionally;   Substance Use Topics  . Alcohol use: No    Comment: stopped 12 years ago  . Drug use: No     Colonoscopy:  PAP:  Bone density:  Lipid panel:  No Known Allergies  Current Facility-Administered Medications  Medication Dose Route Frequency Provider Last Rate Last Dose  . insulin aspart (novoLOG) injection 0-20 Units  0-20 Units Subcutaneous Q6H Epifanio Lesches, MD   3 Units at 12/04/17 1305  . insulin glargine (LANTUS) injection 14 Units  14 Units Subcutaneous QHS Epifanio Lesches, MD   14 Units at 12/03/17 2159  . iron sucrose (VENOFER) 400 mg in sodium chloride 0.9 % 250 mL IVPB  400 mg Intravenous Once Wieting, Richard, MD      . lactulose (CHRONULAC) 10 GM/15ML solution 30 g  30 g Oral BID Wieting, Richard, MD      . LORazepam (ATIVAN) tablet 0.5 mg  0.5 mg Oral TID PRN Loletha Grayer, MD   0.5 mg at 12/04/17 1305  .  ondansetron (ZOFRAN) tablet 4 mg  4 mg Oral Q6H PRN Saundra Shelling, MD       Or  . ondansetron (ZOFRAN) injection 4 mg  4 mg Intravenous Q6H PRN Saundra Shelling, MD      . Derrill Memo ON 12/06/2017] pantoprazole (PROTONIX) injection 40 mg  40 mg Intravenous Q12H [place], [place], MD      . potassium chloride 10 mEq in 100 mL IVPB  10 mEq Intravenous Q1 Hr x 3 Wieting, Richard, MD 100 mL/hr at 12/04/17 1225 10 mEq at 12/04/17 1225  . rifaximin (XIFAXAN) tablet 550 mg  550 mg Oral  BID Loletha Grayer, MD      . spironolactone (ALDACTONE) tablet 25 mg  25 mg Oral Daily Loletha Grayer, MD        OBJECTIVE: Vitals:   12/04/17 1040 12/04/17 1042  BP: (!) 130/56 (!) 130/56  Pulse:  70  Resp:  12  Temp: (!) 96.4 F (35.8 C) 97.6 F (36.4 C)  SpO2:  98%     Body mass index is 46.44 kg/m.    ECOG FS:2 - Symptomatic, <50% confined to bed  General: Well-developed, well-nourished, no acute distress. Eyes: Pink conjunctiva, anicteric sclera. HEENT: Normocephalic, moist mucous membranes, clear oropharnyx. Lungs: Clear to auscultation bilaterally. Heart: Regular rate and rhythm. No rubs, murmurs, or gallops. Abdomen: Soft, nontender, nondistended. No organomegaly noted, normoactive bowel sounds. Musculoskeletal: No edema, cyanosis, or clubbing. Neuro: Alert, answering all questions appropriately. Cranial nerves grossly intact. Skin: No rashes or petechiae noted. Psych: Normal affect. Lymphatics: No cervical, calvicular, axillary or inguinal LAD.   LAB RESULTS:  Lab Results  Component Value Date   NA 138 12/04/2017   K 3.0 (L) 12/04/2017   CL 103 12/04/2017   CO2 29 12/04/2017   GLUCOSE 142 (H) 12/04/2017   BUN 25 (H) 12/04/2017   CREATININE 1.81 (H) 12/04/2017   CALCIUM 8.3 (L) 12/04/2017   PROT 6.5 12/02/2017   ALBUMIN 2.6 (L) 12/02/2017   AST 35 12/02/2017   ALT 20 12/02/2017   ALKPHOS 74 12/02/2017   BILITOT 0.8 12/02/2017   GFRNONAA 38 (L) 12/04/2017   GFRAA 44 (L) 12/04/2017    Lab Results  Component Value Date   WBC 5.1 12/04/2017   NEUTROABS 6.6 (H) 09/05/2017   HGB 8.5 (L) 12/04/2017   HCT 25.0 (L) 12/04/2017   MCV 90.1 12/04/2017   PLT 118 (L) 12/04/2017     STUDIES: Dg Chest 2 View  Result Date: 12/02/2017 CLINICAL DATA:  Cough and shortness of Breath EXAM: CHEST  2 VIEW COMPARISON:  11/22/2017 FINDINGS: Cardiac shadow is stable. The lungs are well aerated bilaterally. No focal infiltrate or sizable effusion is seen. Degenerative  changes of the thoracic spine are noted. IMPRESSION: No active cardiopulmonary disease. Electronically Signed   By: Inez Catalina M.D.   On: 12/02/2017 12:52   Dg Chest 2 View  Result Date: 11/22/2017 CLINICAL DATA:  67 y/o  M; shortness of breath. EXAM: CHEST  2 VIEW COMPARISON:  11/21/2017 chest radiograph FINDINGS: Stable mild cardiomegaly given projection and technique. Pulmonary vascular congestion, no consolidation. Probable small right effusion. Bones are unremarkable. IMPRESSION: Stable mild cardiomegaly. Pulmonary vascular congestion. Probable small right effusion. Electronically Signed   By: Kristine Garbe M.D.   On: 11/22/2017 18:33   Dg Chest 2 View  Result Date: 11/21/2017 CLINICAL DATA:  Shortness of breath EXAM: CHEST  2 VIEW COMPARISON:  11/08/2017 FINDINGS: Mild cardiomegaly. Stable mediastinal contours. Low volume chest with prominent interstitial  markings that is stable. No Kerley lines, effusion, or pneumothorax. Spondylosis with multilevel thoracic ankylosis. IMPRESSION: 1. Interstitial coarsening that could be congestive or bronchitic. 2. Mild cardiomegaly. Electronically Signed   By: Monte Fantasia M.D.   On: 11/21/2017 18:21   Dg Chest 2 View  Result Date: 11/08/2017 CLINICAL DATA:  Altered mental status.  Smoker. EXAM: CHEST  2 VIEW COMPARISON:  10/17/2017. FINDINGS: Poor inspiration. The cardiac silhouette remains borderline enlarged and the pulmonary vasculature is mildly prominent. Clear lungs. Thoracic spine degenerative changes with DISH. Mild diffuse peribronchial thickening. IMPRESSION: 1. Stable borderline cardiomegaly with interval mild pulmonary vascular congestion. 2. Mild chronic bronchitic changes. Electronically Signed   By: Claudie Revering M.D.   On: 11/08/2017 08:46    ASSESSMENT: Iron deficiency anemia, thrombocytopenia.  PLAN:    1.  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  Possible  EGD and capsule endoscopy in the near future.  Patient's hemoglobin has improved to 8.5.  He does not require transfusion at this time.  He would definitely benefit from IV iron in the future.  He does not require bone marrow biopsy.  Will arrange follow-up in the cancer center 1-2 weeks after discharge for further evaluation and blood transfusion or IV iron as needed. 2.  Thrombocytopenia: Likely multifactorial including secondary to his known cirrhosis.  Patient's platelets appear to be at his baseline.  No intervention is needed.  Follow-up in the cancer center as above.  Appreciate consult, will follow.   Lloyd Huger, MD   12/04/2017 2:24 PM

## 2017-12-04 NOTE — Anesthesia Postprocedure Evaluation (Signed)
Anesthesia Post Note  Patient: Corey West  Procedure(s) Performed: ESOPHAGOGASTRODUODENOSCOPY (EGD) (N/A )  Patient location during evaluation: Endoscopy Anesthesia Type: General Level of consciousness: awake and alert Pain management: pain level controlled Vital Signs Assessment: post-procedure vital signs reviewed and stable Respiratory status: spontaneous breathing, nonlabored ventilation, respiratory function stable and patient connected to nasal cannula oxygen Cardiovascular status: blood pressure returned to baseline and stable Postop Assessment: no apparent nausea or vomiting Anesthetic complications: no     Last Vitals:  Vitals:   12/04/17 1040 12/04/17 1042  BP: (!) 130/56 (!) 130/56  Pulse:  70  Resp:  12  Temp: (!) 35.8 C 36.4 C  SpO2:  98%    Last Pain:  Vitals:   12/04/17 1040  TempSrc: Tympanic  PainSc:                  Precious Haws [place]

## 2017-12-04 NOTE — Progress Notes (Signed)
Pharmacy consulted for electrolyte replacement protocol:  Pharmacy consulted to assist in monitoring and replacing electrolytes in this 67 y.o. male admitted on 12/02/2017 with Shortness of Breath and Weakness Patient with GI bleed, symptomatic anemia, hypokalemia  Goal of therapy: Electrolytes within normal limits:  K 3.5 - 5.1 Corrected Ca 8.9 - 10.3 Phos 2.5 - 4.6 Mg 1.7 - 2.4   Assessment: Lab Results  Component Value Date   CREATININE 1.81 (H) 12/04/2017   BUN 25 (H) 12/04/2017   NA 138 12/04/2017   K 3.2 (L) 12/04/2017   CL 103 12/04/2017   CO2 29 12/04/2017   Magnesium (mg/dL)  Date Value  12/03/2017 1.9  05/11/2014 2.1  ]  Plan: 3/6 PM: K: 3.2 Will order KCl 67mEq IV x 3.  Recheck electrolytes with AM labs and continue to replace as needed.    Pernell Dupre, PharmD, BCPS Clinical Pharmacist 12/04/2017 8:05 PM

## 2017-12-04 NOTE — Progress Notes (Signed)
Talked to Dr. Leslye Peer about patient comes back from EGD order for clear liquid given. Also patient requesting to take Ativan 0.5 mg p.o for anxiety, patient takes this medication at home. RN will continue to monitor.

## 2017-12-04 NOTE — Op Note (Signed)
[facility] Gastroenterology Patient Name: Corey West Procedure Date: 12/04/2017 10:07 AM MRN: [id number] Account #: [id number] Date of Birth: 04-19-1951 Admit Type: Inpatient Age: 67 Room: [facility] ENDO ROOM 3 Gender: Male Note Status: Finalized Procedure:            Upper GI endoscopy Indications:          Recent gastrointestinal bleeding, Suspected upper                        gastrointestinal bleeding Providers:            Manya Silvas, MD Referring MD:         Andres Labrum, MD (Referring MD) Medicines:            Propofol per Anesthesia Complications:        No immediate complications. Procedure:            Pre-Anesthesia Assessment:                       - After reviewing the risks and benefits, the patient                        was deemed in satisfactory condition to undergo the                        procedure.                       After obtaining informed consent, the endoscope was                        passed under direct vision. Throughout the procedure,                        the patient's blood pressure, pulse, and oxygen                        saturations were monitored continuously. The Endoscope                        was introduced through the mouth, and advanced to the                        second part of duodenum. The upper GI endoscopy was                        accomplished without difficulty. The patient tolerated                        the procedure well. Findings:      There were esophageal mucosal changes consistent with long-segment       Barrett's esophagus present in the lower third of the esophagus. The       maximum longitudinal extent of these mucosal changes was 5 cm in length.      Red blood was found in the gastric antrum. This was washed off and the       areas were treated with argon Coagulation for tissue destruction using       argon plasma at 0.8 liters/minute and 30 watts was successful. The       stomach lining was  covered  with deep erythematous tissue but no sign of       bleeding or polyps which was seen before. Impression:           - Esophageal mucosal changes consistent with                        long-segment Barrett's esophagus.                       - Red blood in the gastric antrum. Treated with argon                        plasma coagulation (APC).                       - No specimens collected. Recommendation:       - The findings and recommendations were discussed with                        the patient. Manya Silvas, MD 12/04/2017 10:42:40 AM This report has been signed electronically. Number of Addenda: 0 Note Initiated On: 12/04/2017 10:07 AM      [facility]

## 2017-12-04 NOTE — Transfer of Care (Signed)
Immediate Anesthesia Transfer of Care Note  Patient: Corey West  Procedure(s) Performed: ESOPHAGOGASTRODUODENOSCOPY (EGD) (N/A )  Patient Location: PACU and Endoscopy Unit  Anesthesia Type:General  Level of Consciousness: awake and sedated  Airway & Oxygen Therapy: Patient Spontanous Breathing and Patient connected to nasal cannula oxygen  Post-op Assessment: Report given to RN and Post -op Vital signs reviewed and stable  Post vital signs: Reviewed and stable  Last Vitals:  Vitals:   12/04/17 0751 12/04/17 1040  BP: (!) 137/52   Pulse: 64   Resp:    Temp: 36.7 C (!) (P) 35.8 C  SpO2: 98%     Last Pain:  Vitals:   12/04/17 1040  TempSrc: (P) Tympanic  PainSc:          Complications: No apparent anesthesia complications

## 2017-12-04 NOTE — Progress Notes (Signed)
Patient ID: Corey West, male   DOB: [birth year], 67 y.o.   MRN: [id number]  [facility] PROGRESS NOTE  Corey West:[id number] DOB: June 06, 1951 DOA: 12/02/2017 PCP: Baxter Hire, MD  HPI/Subjective: Patient had endoscopy today which showed some red blood in the antrum of the stomach which was cauterized and some Barrett's esophagus.  Patient feels okay.  Asking for his anxiety medication.  Objective: Vitals:   12/04/17 1040 12/04/17 1042  BP: (!) 130/56 (!) 130/56  Pulse:  70  Resp:  12  Temp: (!) 96.4 F (35.8 C) 97.6 F (36.4 C)  SpO2:  98%    Filed Weights   12/03/17 0034 12/03/17 0622 12/04/17 0308  Weight: (!) 141.5 kg (312 lb) (!) 141.5 kg (312 lb) (!) 142.7 kg (314 lb 8 oz)    ROS: Review of Systems  Constitutional: Negative for chills and fever.  Eyes: Negative for blurred vision.  Respiratory: Negative for cough and shortness of breath.   Cardiovascular: Negative for chest pain.  Gastrointestinal: Negative for abdominal pain, constipation, diarrhea, nausea and vomiting.  Genitourinary: Negative for dysuria.  Musculoskeletal: Negative for joint pain.  Neurological: Negative for dizziness and headaches.   Exam: Physical Exam  Constitutional: He is oriented to person, place, and time.  HENT:  Nose: No mucosal edema.  Mouth/Throat: No oropharyngeal exudate or posterior oropharyngeal edema.  Eyes: Conjunctivae, EOM and lids are normal. Pupils are equal, round, and reactive to light.  Neck: No JVD present. Carotid bruit is not present. No edema present. No thyroid mass and no thyromegaly present.  Cardiovascular: S1 normal and S2 normal. Exam reveals no gallop.  No murmur heard. Pulses:      Dorsalis pedis pulses are 2+ on the right side, and 2+ on the left side.  Respiratory: No respiratory distress. He has decreased breath sounds in the right lower field and the left lower field. He has no wheezes. He has no rhonchi. He has no rales.  GI: Soft. Bowel  sounds are normal. He exhibits distension. There is no tenderness.  Musculoskeletal:       Right ankle: He exhibits swelling.       Left ankle: He exhibits swelling.  Lymphadenopathy:    He has no cervical adenopathy.  Neurological: He is alert and oriented to person, place, and time. No cranial nerve deficit.  Skin: Skin is warm. No rash noted. Nails show no clubbing.  Psychiatric: He has a normal mood and affect.      Data Reviewed: Basic Metabolic Panel: Recent Labs  Lab 12/02/17 1205 12/02/17 2000 12/03/17 0442 12/03/17 1804 12/04/17 0457  NA 134*  --  137  --  138  K 2.2* 2.5* 2.4* 3.5 3.0*  CL 97*  --  101  --  103  CO2 27  --  26  --  29  GLUCOSE 252*  --  150*  --  142*  BUN 29*  --  28*  --  25*  CREATININE 1.92*  --  1.91*  --  1.81*  CALCIUM 8.5*  --  8.1*  --  8.3*  MG 1.9  --  2.0 1.9  --    Liver Function Tests: Recent Labs  Lab 12/02/17 1205  AST 35  ALT 20  ALKPHOS 74  BILITOT 0.8  PROT 6.5  ALBUMIN 2.6*    Recent Labs  Lab 12/02/17 1523  AMMONIA 72*   CBC: Recent Labs  Lab 12/02/17 1205 12/02/17 2000 12/03/17 0751 12/03/17 1804  12/04/17 0457  WBC 4.9  --   --   --  5.1  HGB 7.2* 7.0* 7.5* 8.3* 8.5*  HCT 21.3* 21.3* 21.6* 25.0* 25.0*  MCV 90.6  --   --   --  90.1  PLT 150  --   --   --  118*   Cardiac Enzymes: Recent Labs  Lab 12/02/17 1205  TROPONINI <0.03   BNP (last 3 results) Recent Labs    11/08/17 0723  BNP 48.0     CBG: Recent Labs  Lab 12/03/17 1919 12/03/17 2108 12/04/17 0022 12/04/17 0600 12/04/17 1251  GLUCAP 231* 201* 172* 135* 146*    Scheduled Meds: . insulin aspart  0-20 Units Subcutaneous Q6H  . insulin glargine  14 Units Subcutaneous QHS  . lactulose  30 g Oral BID  . [START ON 12/06/2017] pantoprazole  40 mg Intravenous Q12H  . polyethylene glycol powder  1 Container Oral Once  . rifaximin  550 mg Oral BID  . spironolactone  25 mg Oral Daily   Continuous Infusions: . iron sucrose 400 mg  (12/04/17 1603)    Assessment/Plan:  1. Acute on chronic upper GI bleed.  Symptomatic iron deficiency anemia.  Patient was given a couple units of blood transfusion.  Last hemoglobin 8.5.  Give IV iron today.  Patient stated that Dr. Vira Agar is going to order a capsule endoscopy for tomorrow.  Patient will have to follow-up with hematology for IV iron infusions as outpatient. 2. Hypokalemia.  Replace potassium orally.  Hold Lasix for right now.  Start Spironolactone. 3. Hepatic encephalopathy.  Restart lactulose and Xifaxan  4. Type 2 diabetes mellitus placed on glargine insulin and aspart sliding scale 5. Morbid obesity.  Weight loss needed. 6. Hepatic cirrhosis, thrombocytopenia 7. Chronic kidney disease stage III  Code Status:     Code Status Orders  (From admission, onward)        Start     Ordered   12/02/17 2004  Full code  Continuous     12/02/17 2003    Code Status History    Date Active Date Inactive Code Status Order ID Comments User Context   11/21/2017 19:48 11/23/2017 19:58 Full Code [id number]  Loletha Grayer, MD ED   10/23/2017 14:05 10/26/2017 14:02 Full Code [id number]  Bettey Costa, MD Inpatient   10/18/2017 00:58 10/19/2017 18:06 Full Code [id number]  Saundra Shelling, MD Inpatient   10/09/2017 21:39 10/11/2017 18:06 Full Code [id number]  [name], MD Inpatient   09/03/2017 21:24 09/05/2017 15:52 Full Code [id number]  Nicholes Mango, MD Inpatient   06/26/2017 11:19 06/28/2017 17:48 Full Code [id number]  Henreitta Leber, MD Inpatient   02/21/2017 20:07 02/22/2017 18:03 Full Code [id number]  [name], MD Inpatient   07/20/2015 12:45 07/22/2015 22:17 Full Code [id number]  Nicholes Mango, MD Inpatient   02/08/2015 23:31 02/11/2015 19:43 Full Code [id number]  Hower, Aaron Mose, MD ED     Disposition Plan: Potentially home tomorrow evening after capsule endoscopy  Consultants:  Gastroenterology  Procedures:   endoscopy  Time spent: 28 minutes  [place]

## 2017-12-04 NOTE — Progress Notes (Addendum)
Pharmacy consulted for electrolyte replacement protocol:  Pharmacy consulted to assist in monitoring and replacing electrolytes in this 67 y.o. male admitted on 12/02/2017 with Shortness of Breath and Weakness Patient with GI bleed, symptomatic anemia, hypokalemia  Goal of therapy: Electrolytes within normal limits:  K 3.5 - 5.1 Corrected Ca 8.9 - 10.3 Phos 2.5 - 4.6 Mg 1.7 - 2.4   Assessment: Lab Results  Component Value Date   CREATININE 1.81 (H) 12/04/2017   BUN 25 (H) 12/04/2017   NA 138 12/04/2017   K 3.0 (L) 12/04/2017   CL 103 12/04/2017   CO2 29 12/04/2017  last Mg 1.9 on 3/5   Plan: Potassium 3.0, will give KCL 59meq iv q1h x 4 doses. Will recheck K tonight at 1800 per protocol. Will recheck electrolytes with AM labs including Mg.     Thomasenia Sales, PharmD, MBA, [facility]

## 2017-12-04 NOTE — Anesthesia Preprocedure Evaluation (Signed)
Anesthesia Evaluation  Patient identified by MRN, date of birth, ID band Patient awake    Reviewed: Allergy & Precautions, H&P , NPO status , Patient's Chart, lab work & pertinent test results  History of Anesthesia Complications Negative for: history of anesthetic complications  Airway Mallampati: III  TM Distance: <3 FB Neck ROM: full    Dental  (+) Chipped, Poor Dentition, Missing   Pulmonary sleep apnea , COPD, Current Smoker,           Cardiovascular Exercise Tolerance: Good hypertension, (-) angina+ Peripheral Vascular Disease  (-) Past MI and (-) DOE      Neuro/Psych PSYCHIATRIC DISORDERS Anxiety Depression negative neurological ROS     GI/Hepatic Neg liver ROS, hiatal hernia, GERD  Medicated and Controlled,  Endo/Other  diabetes, Type 2  Renal/GU Renal disease  negative genitourinary   Musculoskeletal  (+) Arthritis ,   Abdominal   Peds  Hematology negative hematology ROS (+)   Anesthesia Other Findings Past Medical History: No date: Anemia No date: Anxiety     Comment:  controlled;  No date: Arthritis No date: AVM (arteriovenous malformation) of stomach, acquired with  hemorrhage No date: Barrett's esophagus No date: Chronic kidney disease     Comment:  renal infufficiency No date: Cirrhosis ([place]) No date: Depression     Comment:  controlled;  No date: Diabetes mellitus without complication (HCC)     Comment:  not controlled, taking insulin but sugar continues to               run high;  No date: Edema No date: Esophageal varices (HCC) No date: GERD (gastroesophageal reflux disease) No date: History of hiatal hernia No date: Hyperlipidemia No date: Hypertension     Comment:  controlled well;  No date: Nephropathy, 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[place]) No date: Obesity No date: Pancytopenia ([place]) No date: Polyp, stomach     Comment:  with chronic blood loss No date: Sleep apnea     Comment:  does not wear a  cpap, Medicare would not pay for it  No date: Venous stasis dermatitis of both lower extremities No date: Venous stasis of both lower extremities     Comment:  with cellulitis  Past Surgical History: 02/09/2015: ESOPHAGOGASTRODUODENOSCOPY; N/A     Comment:  Procedure: ESOPHAGOGASTRODUODENOSCOPY (EGD);  Surgeon:               Manya Silvas, MD;  Location[facility] ENDOSCOPY;                Service: Endoscopy;  Laterality: N/A; 07/22/2015: ESOPHAGOGASTRODUODENOSCOPY; N/A     Comment:  Procedure: ESOPHAGOGASTRODUODENOSCOPY (EGD);  Surgeon:               Manya Silvas, MD;  Location: [facility] ENDOSCOPY;                Service: Endoscopy;  Laterality: N/A; 06/26/2017: ESOPHAGOGASTRODUODENOSCOPY; N/A     Comment:  Procedure: ESOPHAGOGASTRODUODENOSCOPY (EGD);  Surgeon:               Manya Silvas, MD;  Location: [facility] ENDOSCOPY;                Service: Endoscopy;  Laterality: N/A; 06/27/2017: ESOPHAGOGASTRODUODENOSCOPY; N/A     Comment:  Procedure: ESOPHAGOGASTRODUODENOSCOPY (EGD);  Surgeon:               Manya Silvas, MD;  Location: [facility] ENDOSCOPY;  Service: Endoscopy;  Laterality: N/A; 06/28/2017: ESOPHAGOGASTRODUODENOSCOPY; N/A     Comment:  Procedure: ESOPHAGOGASTRODUODENOSCOPY (EGD);  Surgeon:               Manya Silvas, MD;  Location: [facility] ENDOSCOPY;                Service: Endoscopy;  Laterality: N/A; 10/11/2017: ESOPHAGOGASTRODUODENOSCOPY; Bilateral     Comment:  Procedure: ESOPHAGOGASTRODUODENOSCOPY (EGD);  Surgeon:               Manya Silvas, MD;  Location: [facility] ENDOSCOPY;                Service: Endoscopy;  Laterality: Bilateral; 07/20/2015: ESOPHAGOGASTRODUODENOSCOPY (EGD) WITH PROPOFOL; N/A     Comment:  Procedure: ESOPHAGOGASTRODUODENOSCOPY (EGD) WITH               PROPOFOL;  Surgeon: Manya Silvas, MD;  Location: [facility]              ENDOSCOPY;  Service: Endoscopy;  Laterality: N/A; 09/16/2015: ESOPHAGOGASTRODUODENOSCOPY (EGD) WITH PROPOFOL; N/A      Comment:  Procedure: ESOPHAGOGASTRODUODENOSCOPY (EGD) WITH               PROPOFOL;  Surgeon: Manya Silvas, MD;  Location: [facility]              ENDOSCOPY;  Service: Endoscopy;  Laterality: N/A; 03/16/2016: ESOPHAGOGASTRODUODENOSCOPY (EGD) WITH PROPOFOL; N/A     Comment:  Procedure: ESOPHAGOGASTRODUODENOSCOPY (EGD) WITH               PROPOFOL;  Surgeon: Manya Silvas, MD;  Location: [facility]              ENDOSCOPY;  Service: Endoscopy;  Laterality: N/A; 09/14/2016: ESOPHAGOGASTRODUODENOSCOPY (EGD) WITH PROPOFOL; N/A     Comment:  Procedure: ESOPHAGOGASTRODUODENOSCOPY (EGD) WITH               PROPOFOL;  Surgeon: Manya Silvas, MD;  Location: [facility]              ENDOSCOPY;  Service: Endoscopy;  Laterality: N/A; 11/05/2016: ESOPHAGOGASTRODUODENOSCOPY (EGD) WITH PROPOFOL; N/A     Comment:  Procedure: ESOPHAGOGASTRODUODENOSCOPY (EGD) WITH               PROPOFOL;  Surgeon: Manya Silvas, MD;  Location: [facility]              ENDOSCOPY;  Service: Endoscopy;  Laterality: N/A; 02/06/2017: ESOPHAGOGASTRODUODENOSCOPY (EGD) WITH PROPOFOL; N/A     Comment:  Procedure: ESOPHAGOGASTRODUODENOSCOPY (EGD) WITH               PROPOFOL;  Surgeon: Manya Silvas, MD;  Location:               [facility] ENDOSCOPY;  Service: Endoscopy;  Laterality: N/A; 04/24/2017: ESOPHAGOGASTRODUODENOSCOPY (EGD) WITH PROPOFOL; N/A     Comment:  Procedure: ESOPHAGOGASTRODUODENOSCOPY (EGD) WITH               PROPOFOL;  Surgeon: Manya Silvas, MD;  Location:               [facility] ENDOSCOPY;  Service: Endoscopy;  Laterality: N/A; 07/31/2017: ESOPHAGOGASTRODUODENOSCOPY (EGD) WITH PROPOFOL; N/A     Comment:  Procedure: ESOPHAGOGASTRODUODENOSCOPY (EGD) WITH               PROPOFOL;  Surgeon: Manya Silvas, MD;  Location:               [facility] ENDOSCOPY;  Service: Endoscopy;  Laterality:  N/A; No date: TONSILLECTOMY No date: TONSILLECTOMY AND ADENOIDECTOMY No date: ULNAR NERVE TRANSPOSITION No date: UVULOPALATOPHARYNGOPLASTY  BMI    Body  Mass Index:  46.44 kg/m      Reproductive/Obstetrics negative OB ROS                             Anesthesia Physical Anesthesia Plan  ASA: III  Anesthesia Plan: General   Post-op Pain Management:    Induction: Intravenous  PONV Risk Score and Plan: Propofol infusion  Airway Management Planned: Natural Airway and Nasal Cannula  Additional Equipment:   Intra-op Plan:   Post-operative Plan:   Informed Consent: I have reviewed the patients History and Physical, chart, labs and discussed the procedure including the risks, benefits and alternatives for the proposed anesthesia with the patient or authorized representative who has indicated his/her understanding and acceptance.   Dental Advisory Given  Plan Discussed with: Anesthesiologist, CRNA and Surgeon  Anesthesia Plan Comments: (Patient consented for risks of anesthesia including but not limited to:  - adverse reactions to medications - risk of intubation if required - damage to teeth, lips or other oral mucosa - sore throat or hoarseness - Damage to heart, brain, lungs or loss of life  Patient voiced understanding.)        Anesthesia Quick Evaluation

## 2017-12-04 NOTE — Progress Notes (Signed)
Pt called this RN stating he was very nervous & can't sleep, and realized that we haven't given him his ativan that he takes at home, pt takes 0.5 ativan 3 times a day PRN at home, however pt is NPO for an EGD today, so Dr. Jannifer Franklin to order one time dose of IV ativan. Will continue to monitor. Conley Simmonds, RN, BSN

## 2017-12-04 NOTE — Anesthesia Post-op Follow-up Note (Signed)
Anesthesia QCDR form completed.        

## 2017-12-04 NOTE — Progress Notes (Signed)
Talked to Dr. Tiffany Kocher about patient's capsule EGD tomorrow, per MD do not need a consent and patient does not need to be NPO ok for clear liquids but no red dye. MD also order miralax that will be given at 1900 tonight. RN will continue to monitor.

## 2017-12-05 ENCOUNTER — Encounter: Payer: Self-pay | Admitting: Unknown Physician Specialty

## 2017-12-05 ENCOUNTER — Encounter
Admission: EM | Disposition: A | Discharge status: Home / Self Care | Payer: Self-pay | Source: Home / Self Care | Attending: Internal Medicine

## 2017-12-05 HISTORY — PX: GIVENS CAPSULE STUDY: SHX5432

## 2017-12-05 LAB — GLUCOSE, CAPILLARY
GLUCOSE-CAPILLARY: 152 mg/dL — AB (ref 65–99)
GLUCOSE-CAPILLARY: 234 mg/dL — AB (ref 65–99)
Glucose-Capillary: 116 mg/dL — ABNORMAL HIGH (ref 65–99)

## 2017-12-05 LAB — PREPARE RBC (CROSSMATCH)

## 2017-12-05 LAB — BASIC METABOLIC PANEL
ANION GAP: 6 (ref 5–15)
BUN: 21 mg/dL — ABNORMAL HIGH (ref 6–20)
CO2: 29 mmol/L (ref 22–32)
Calcium: 8 mg/dL — ABNORMAL LOW (ref 8.9–10.3)
Chloride: 103 mmol/L (ref 101–111)
Creatinine, Ser: 1.58 mg/dL — ABNORMAL HIGH (ref 0.61–1.24)
GFR, EST AFRICAN AMERICAN: 51 mL/min — AB (ref >60)
GFR, EST NON AFRICAN AMERICAN: 44 mL/min — AB (ref >60)
Glucose, Bld: 173 mg/dL — ABNORMAL HIGH (ref 65–99)
POTASSIUM: 3.3 mmol/L — AB (ref 3.5–5.1)
Sodium: 138 mmol/L (ref 135–145)

## 2017-12-05 LAB — HEMOGLOBIN: HEMOGLOBIN: 8.3 g/dL — AB (ref 13.0–18.0)

## 2017-12-05 LAB — MAGNESIUM: Magnesium: 1.9 mg/dL (ref 1.7–2.4)

## 2017-12-05 SURGERY — IMAGING PROCEDURE, GI TRACT, INTRALUMINAL, VIA CAPSULE

## 2017-12-05 MED ORDER — POTASSIUM CHLORIDE 10 MEQ/100ML IV SOLN
10.0000 meq | INTRAVENOUS | Status: DC
  Filled 2017-12-05 (×3): qty 100

## 2017-12-05 MED ORDER — MAGNESIUM OXIDE -MG SUPPLEMENT 400 (240 MG) MG PO TABS: 400.0000 mg | ORAL_TABLET | Freq: Every morning | ORAL | 0 refills | Status: DC

## 2017-12-05 MED ORDER — POTASSIUM CHLORIDE CRYS ER 20 MEQ PO TBCR: 40.0000 meq | EXTENDED_RELEASE_TABLET | Freq: Two times a day (BID) | ORAL | 0 refills | Status: DC

## 2017-12-05 MED ORDER — POTASSIUM CHLORIDE CRYS ER 20 MEQ PO TBCR: 40.0000 meq | EXTENDED_RELEASE_TABLET | Freq: Every day | ORAL | Status: DC

## 2017-12-05 MED ORDER — INSULIN ASPART 100 UNIT/ML ~~LOC~~ SOLN: 8.0000 [IU] | Freq: Three times a day (TID) | SUBCUTANEOUS | 0 refills | Status: DC

## 2017-12-05 MED ORDER — SPIRONOLACTONE 25 MG PO TABS: 25.0000 mg | ORAL_TABLET | Freq: Two times a day (BID) | ORAL | 0 refills | Status: DC

## 2017-12-05 MED ORDER — POTASSIUM CHLORIDE CRYS ER 20 MEQ PO TBCR: 40.0000 meq | EXTENDED_RELEASE_TABLET | Freq: Two times a day (BID) | ORAL | Status: DC

## 2017-12-05 MED ORDER — INSULIN GLARGINE 100 UNIT/ML ~~LOC~~ SOLN
20.0000 [IU] | Freq: Every day | SUBCUTANEOUS | Status: DC
  Filled 2017-12-05: qty 0.2

## 2017-12-05 MED ORDER — LACTULOSE 10 GM/15ML PO SOLN
30.0000 g | Freq: Four times a day (QID) | ORAL | Status: DC
  Filled 2017-12-05: qty 60

## 2017-12-05 MED ORDER — PANTOPRAZOLE SODIUM 40 MG PO TBEC: 40.0000 mg | DELAYED_RELEASE_TABLET | Freq: Every day | ORAL | 0 refills | Status: DC

## 2017-12-05 MED ORDER — POTASSIUM CHLORIDE CRYS ER 20 MEQ PO TBCR
40.0000 meq | EXTENDED_RELEASE_TABLET | Freq: Two times a day (BID) | ORAL | Status: DC
  Administered 2017-12-05: 40 meq via ORAL
  Filled 2017-12-05: qty 2

## 2017-12-05 MED ORDER — INSULIN ASPART PROT & ASPART (70-30 MIX) 100 UNIT/ML ~~LOC~~ SUSP: 24.0000 [IU] | Freq: Two times a day (BID) | SUBCUTANEOUS | Status: DC

## 2017-12-05 MED ORDER — INSULIN GLARGINE 100 UNIT/ML ~~LOC~~ SOLN: 20.0000 [IU] | Freq: Every day | SUBCUTANEOUS | 0 refills | Status: DC

## 2017-12-05 MED ORDER — MAGNESIUM SULFATE 2 GM/50ML IV SOLN
2.0000 g | Freq: Once | INTRAVENOUS | Status: AC
  Administered 2017-12-05: 2 g via INTRAVENOUS
  Filled 2017-12-05 (×2): qty 50

## 2017-12-05 NOTE — Consult Note (Signed)
Patient color looks good ( for him).  He has swallowed the Given capsule and this will be over around 5 pm.  Given his labs and better energy I think he can go home, discussed with his Hospitalist doctor Dr. Earleen Newport.

## 2017-12-05 NOTE — Plan of Care (Signed)
No complaints of pain, completed bowel prep with two large liquid brown stools. Potassium replaced IV. Capsule study to be done today.

## 2017-12-05 NOTE — Progress Notes (Signed)
IVs and tele removed from patient. Discharge instructions given to patient. Verbalized understanding. Per MD Tiffany Kocher, pt ok to drive himself home as he drive himself here and car is parked in front of emergency room. Patient to be escorted to car for discharge. No distress at this time.

## 2017-12-05 NOTE — Progress Notes (Signed)
Pharmacy consulted for electrolyte replacement protocol:  Pharmacy consulted to assist in monitoring and replacing electrolytes in this 67 y.o. male admitted on 12/02/2017 with Shortness of Breath and Weakness Patient with GI bleed, symptomatic anemia, hypokalemia  Goal of therapy: Electrolytes within normal limits:  K 3.5 - 5.1 Corrected Ca 8.9 - 10.3 Phos 2.5 - 4.6 Mg 1.7 - 2.4   Assessment: Lab Results  Component Value Date   CREATININE 1.58 (H) 12/05/2017   BUN 21 (H) 12/05/2017   NA 138 12/05/2017   K 3.3 (L) 12/05/2017   CL 103 12/05/2017   CO2 29 12/05/2017   Magnesium (mg/dL)  Date Value  12/05/2017 1.9  05/11/2014 2.1  ]  Plan: 3/6 PM: K: 3.3 Will order KCl 33mEq IV x 3.  Recheck electrolytes with AM labs and continue to replace as needed.    Pernell Dupre, PharmD, BCPS Clinical Pharmacist 12/05/2017 7:33 AM

## 2017-12-05 NOTE — Progress Notes (Signed)
Inpatient Diabetes Program Recommendations  AACE/ADA: New Consensus Statement on Inpatient Glycemic Control (2019)  Target Ranges:  Prepandial:   less than 140 mg/dL      Peak postprandial:   less than 180 mg/dL (1-2 hours)      Critically ill patients:  140 - 180 mg/dL  Results for Corey West, Corey West (MRN [id number]) as of 12/05/2017 11:00  Ref. Range 12/04/2017 00:22 12/04/2017 06:00 12/04/2017 12:51 12/04/2017 18:12 12/04/2017 21:08 12/05/2017 00:19  Glucose-Capillary Latest Ref Range: 65 - 99 mg/dL 172 (H)  Novolog 4 units 135 (H)  Novolog 3 units 146 (H)  Novolog 3 units 299 (H)  Novolog 11 units 274 (H)  Lantus 14 units 234 (H)  Novolog 7 units    Review of Glycemic Control  Diabetes history: DM2 Outpatient Diabetes medications: 70/30 84 units QAM, 70/30 24 units QPM Current orders for Inpatient glycemic control: Lantus 14 units QHS, Novolog 0-20 units Q6H  Inpatient Diabetes Program Recommendations:  Insulin - Basal:  Please consider increasing Lantus to 20 units QHS. Correction (SSI): Once diet resumed, please consider changing frequency of CBGs and Novolog correction to ACHS. Insulin-Meal Coverage: Once diet is ordered and if patient is eating at least 50% of meals, please consider ordering Novolog 4 units TID with meals if patient eats at least 50% of meals.  Thanks, Barnie Alderman, RN, MSN, CDE Diabetes Coordinator Inpatient Diabetes Program [phone] (Team Pager from 8am to 5pm)

## 2017-12-05 NOTE — Consult Note (Signed)
[place] Nurse wound consult note Reason for Consult:Placement of bilateral Unna's Boots. Wound type:No wounds.  Chronic venous insufficiency Pressure Injury POA: NA Measurement:N/A Wound FKV:QOHC Drainage (amount, consistency, odor) None Periwound:N/A Dressing procedure/placement/frequency: LEs cleansed and patted dry.  Unna's boots placed from just below toes to just below knee, heel inclusive. Topped with 4-inch coban.    [place] nursing team will follow for placement of Unna's Boots on Thursdays.  We will remain available to this patient, the nursing and medical teams.  Thanks, Maudie Flakes, MSN, RN, [name], Arther Abbott  Pager# [phone]

## 2017-12-05 NOTE — Discharge Instructions (Signed)
Check hemoglobin and potassium and magnesium at follow up appointment

## 2017-12-05 NOTE — Discharge Summary (Signed)
[facility] [place] NAME: Corey West    MR#:  [id number]  DATE OF BIRTH:  07/29/1951  DATE OF ADMISSION:  12/02/2017 ADMITTING PHYSICIAN: Saundra Shelling, MD  DATE OF DISCHARGE: 12/05/2017  PRIMARY CARE PHYSICIAN: Baxter Hire, MD    ADMISSION DIAGNOSIS:  Hypokalemia [E87.6] UGIB (upper gastrointestinal bleed) [K92.2] Severe anemia [D64.9]  DISCHARGE DIAGNOSIS:  Active Problems:   GI bleed   SECONDARY DIAGNOSIS:   Past Medical History:  Diagnosis Date  . Anemia   . Anxiety    controlled;   . Arthritis   . AVM (arteriovenous malformation) of stomach, acquired with hemorrhage   . Barrett's esophagus   . Chronic kidney disease    renal infufficiency  . Cirrhosis ([place])   . Depression    controlled;   Marland Kitchen Diabetes mellitus without complication ([place])    not controlled, taking insulin but sugar continues to run high;   . Edema   . Esophageal varices ([place])   . GERD (gastroesophageal reflux disease)   . History of hiatal hernia   . Hyperlipidemia   . Hypertension    controlled well;   . 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 Obesity   . Pancytopenia ([place])   . Polyp, stomach    with chronic blood loss  . Sleep apnea    does not wear a cpap, Medicare would not pay for it   . Venous stasis dermatitis of both lower extremities   . Venous stasis of both lower extremities    with cellulitis    HOSPITAL COURSE:   1.  Acute on chronic upper GI bleed with symptomatic iron deficiency anemia.  The patient was given a few units of blood.  Last hemoglobin 8.3.  IV iron given yesterday.  EGD by Dr. Vira Agar on 12/04/2017 showed Barrett's esophagus and red blood in the gastric antrum which was coagulated with argon plasma.  Capsule endoscopy study was done on the day of discharge.  Dr. Vira Agar will read this tomorrow and follow-up with the patient about the results and plan of action after that.  Refer to Dr. Grayland Ormond as outpatient.  He can get  transfusions and iron infusions through the cancer center. 2.  Hypokalemia and hypomagnesemia.  Replace magnesium IV  during the hospital course and orally upon discharge.  Increase potassium supplementation to 40 mEq twice daily.  Spironolactone also started.  Recommend checking potassium and magnesium and hemoglobin and follow-up appointment. 3.  History of hepatic encephalopathy restarted lactulose and Xifaxan 4.  Type 2 diabetes mellitus.  In the hospital we gave him glargine insulin and aspart sliding scale.  This was not covered by his insurance he can go back on his 70/30 insulin. 5.  Morbid obesity.  Weight loss needed 6.  Hepatic cirrhosis and thrombocytopenia 7.  Chronic kidney disease stage III   DISCHARGE CONDITIONS:   Fair  CONSULTS OBTAINED:  Treatment Team:  Manya Silvas, MD Lloyd Huger, MD  DRUG ALLERGIES:  No Known Allergies  DISCHARGE MEDICATIONS:   Allergies as of 12/05/2017   No Known Allergies     Medication List    STOP taking these medications   Gerhardt's butt cream Crea     TAKE these medications   FERROUS SULFATE ER PO Take 325 mg by mouth 2 (two) times daily.   furosemide 40 MG tablet Commonly known as:  LASIX Take 40 mg by mouth 2 (two) times daily.   insulin aspart protamine-  aspart (70-30) 100 UNIT/ML injection Commonly known as:  NOVOLOG MIX 70/30 Inject 0.24-0.84 mLs (24-84 Units total) into the skin 2 (two) times daily with a meal. What changed:  how much to take   lactulose 10 GM/15ML solution Commonly known as:  CHRONULAC Take 30 g by mouth 6 (six) times daily.   LORazepam 0.5 MG tablet Commonly known as:  ATIVAN Take 0.5 mg by mouth 3 (three) times daily as needed for anxiety.   Magnesium Oxide 400 (240 Mg) MG Tabs Take 1 tablet (400 mg total) by mouth every morning.   multivitamin tablet Take 1 tablet by mouth daily.   pantoprazole 40 MG tablet Commonly known as:  PROTONIX Take 1 tablet (40 mg total) by  mouth daily.   potassium chloride SA 20 MEQ tablet Commonly known as:  K-DUR,KLOR-CON Take 2 tablets (40 mEq total) by mouth 2 (two) times daily. What changed:    how much to take  when to take this  additional instructions   rifaximin 550 MG Tabs tablet Commonly known as:  XIFAXAN Take 550 mg by mouth 2 (two) times daily.   spironolactone 25 MG tablet Commonly known as:  ALDACTONE Take 1 tablet (25 mg total) by mouth 2 (two) times daily.   traMADol 50 MG tablet Commonly known as:  ULTRAM Take 50 mg by mouth every 8 (eight) hours as needed.        DISCHARGE INSTRUCTIONS:     If you experience worsening of your admission symptoms, develop shortness of breath, life threatening emergency, suicidal or homicidal thoughts you must seek medical attention immediately by calling 911 or calling your MD immediately  if symptoms less severe.  You Must read complete instructions/literature along with all the possible adverse reactions/side effects for all the Medicines you take and that have been prescribed to you. Take any new Medicines after you have completely understood and accept all the possible adverse reactions/side effects.   Please note  You were cared for by a hospitalist during your hospital stay. If you have any questions about your discharge medications or the care you received while you were in the hospital after you are discharged, you can call the unit and asked to speak with the hospitalist on call if the hospitalist that took care of you is not available. Once you are discharged, your primary care physician will handle any further medical issues. Please note that NO REFILLS for any discharge medications will be authorized once you are discharged, as it is imperative that you return to your primary care physician (or establish a relationship with a primary care physician if you do not have one) for your aftercare needs so that they can reassess your need for medications  and monitor your lab values.    Today   CHIEF COMPLAINT:   Chief Complaint  Patient presents with  . Shortness of Breath  . Weakness    HISTORY OF PRESENT ILLNESS:  Corey West  is a 67 y.o. male with a known history of chronic GI bleed presented with shortness of breath and weakness   VITAL SIGNS:  Blood pressure (!) 118/48, pulse (!) 59, temperature 97.7 F (36.5 C), temperature source Oral, resp. rate 20, height 5\' 9"  (1.753 m), weight (!) 140.6 kg (310 lb), SpO2 99 %.   PHYSICAL EXAMINATION:  GENERAL:  67 y.o.-year-old patient lying in the bed with no acute distress.  EYES: Pupils equal, round, reactive to light and accommodation. No scleral icterus. Extraocular muscles intact.  HEENT: Head atraumatic, normocephalic. Oropharynx and nasopharynx clear.  NECK:  Supple, no jugular venous distention. No thyroid enlargement, no tenderness.  LUNGS: Normal breath sounds bilaterally, no wheezing, rales,rhonchi or crepitation. No use of accessory muscles of respiration.  CARDIOVASCULAR: S1, S2 normal. No murmurs, rubs, or gallops.  ABDOMEN: Soft, non-tender, non-distended. Bowel sounds present. No organomegaly or mass.  EXTREMITIES:  3+ edema, no cyanosis, or clubbing.  NEUROLOGIC: Cranial nerves II through XII are intact. Muscle strength 5/5 in all extremities. Sensation intact. Gait not checked.  PSYCHIATRIC: The patient is alert and oriented x 3.  SKIN: Chronic lower extremity discoloration.  Unna boots placed prior to disposition  DATA REVIEW:   CBC Recent Labs  Lab 12/04/17 0457 12/04/17 1942 12/05/17 0421  WBC 5.1  --   --   HGB 8.5* 8.7* 8.3*  HCT 25.0* 25.8*  --   PLT 118*  --   --     Chemistries  Recent Labs  Lab 12/02/17 1205  12/05/17 0421  NA 134*   < > 138  K 2.2*   < > 3.3*  CL 97*   < > 103  CO2 27   < > 29  GLUCOSE 252*   < > 173*  BUN 29*   < > 21*  CREATININE 1.92*   < > 1.58*  CALCIUM 8.5*   < > 8.0*  MG 1.9   < > 1.9  AST 35  --   --    ALT 20  --   --   ALKPHOS 74  --   --   BILITOT 0.8  --   --    < > = values in this interval not displayed.    Cardiac Enzymes Recent Labs  Lab 12/02/17 1205  TROPONINI <0.03     Management plans discussed with the patient, and he is in agreement.  CODE STATUS:     Code Status Orders  (From admission, onward)        Start     Ordered   12/02/17 2004  Full code  Continuous     12/02/17 2003    Code Status History    Date Active Date Inactive Code Status Order ID Comments User Context   11/21/2017 19:48 11/23/2017 19:58 Full Code [id number]  Loletha Grayer, MD ED   10/23/2017 14:05 10/26/2017 14:02 Full Code [id number]  Bettey Costa, MD Inpatient   10/18/2017 00:58 10/19/2017 18:06 Full Code [id number]  Saundra Shelling, MD Inpatient   10/09/2017 21:39 10/11/2017 18:06 Full Code [id number]  [name], MD Inpatient   09/03/2017 21:24 09/05/2017 15:52 Full Code [id number]  Nicholes Mango, MD Inpatient   06/26/2017 11:19 06/28/2017 17:48 Full Code [id number]  Henreitta Leber, MD Inpatient   02/21/2017 20:07 02/22/2017 18:03 Full Code [id number]  [name], MD Inpatient   07/20/2015 12:45 07/22/2015 22:17 Full Code [id number]  Nicholes Mango, MD Inpatient   02/08/2015 23:31 02/11/2015 19:43 Full Code [id number]  Hower, Aaron Mose, MD ED      TOTAL TIME TAKING CARE OF THIS PATIENT: 35 minutes.    Loletha Grayer M.D on 12/05/2017 at 3:39 PM  Between 7am to 6pm - Pager - [phone]  After 6pm go to [email or web address] - password [email or web address] [facility]  [phone]  CC: Primary care physician; Baxter Hire, MD

## 2017-12-06 ENCOUNTER — Encounter: Payer: Self-pay | Admitting: Unknown Physician Specialty

## 2017-12-12 ENCOUNTER — Encounter
Admission: RE | Admit: 2017-12-12 | Discharge: 2017-12-12 | Disposition: A | Discharge status: Home / Self Care | Payer: Medicare Other | Source: Ambulatory Visit | Attending: Unknown Physician Specialty | Admitting: Unknown Physician Specialty

## 2017-12-12 DIAGNOSIS — K7290 Hepatic failure, unspecified without coma: Secondary | ICD-10-CM | POA: Diagnosis not present

## 2017-12-12 LAB — HEMOGLOBIN AND HEMATOCRIT, BLOOD
HCT: 22.3 % — ABNORMAL LOW (ref 40.0–52.0)
Hemoglobin: 7.4 g/dL — ABNORMAL LOW (ref 13.0–18.0)

## 2017-12-12 LAB — PREPARE RBC (CROSSMATCH)

## 2017-12-13 ENCOUNTER — Ambulatory Visit
Admission: RE | Admit: 2017-12-13 | Discharge: 2017-12-13 | Disposition: A | Discharge status: Home / Self Care | Payer: Medicare Other | Source: Ambulatory Visit | Attending: Unknown Physician Specialty | Admitting: Unknown Physician Specialty

## 2017-12-13 DIAGNOSIS — D500 Iron deficiency anemia secondary to blood loss (chronic): Secondary | ICD-10-CM

## 2017-12-13 DIAGNOSIS — K7460 Unspecified cirrhosis of liver: Secondary | ICD-10-CM | POA: Insufficient documentation

## 2017-12-13 DIAGNOSIS — Q2733 Arteriovenous malformation of digestive system vessel: Secondary | ICD-10-CM | POA: Insufficient documentation

## 2017-12-13 MED ORDER — SODIUM CHLORIDE 0.9 % IV SOLN: Freq: Once | INTRAVENOUS | Status: DC

## 2017-12-14 LAB — TYPE AND SCREEN
ABO/RH(D): A NEG
ANTIBODY SCREEN: NEGATIVE
EXTEND SAMPLE REASON: TRANSFUSED
UNIT DIVISION: 0

## 2017-12-14 LAB — BPAM RBC
Blood Product Expiration Date: 201904012359
ISSUE DATE / TIME: 201903150727
Unit Type and Rh: 600

## 2017-12-15 ENCOUNTER — Emergency Department: Payer: Medicare Other

## 2017-12-15 ENCOUNTER — Encounter: Payer: Self-pay | Admitting: Emergency Medicine

## 2017-12-15 ENCOUNTER — Inpatient Hospital Stay
Admission: EM | Admit: 2017-12-15 | Discharge: 2017-12-18 | DRG: 442 | Disposition: A | Discharge status: Home Health | Payer: Medicare Other | Attending: Internal Medicine | Admitting: Internal Medicine

## 2017-12-15 ENCOUNTER — Other Ambulatory Visit: Payer: Self-pay

## 2017-12-15 DIAGNOSIS — L03115 Cellulitis of right lower limb: Secondary | ICD-10-CM | POA: Diagnosis present

## 2017-12-15 DIAGNOSIS — Z66 Do not resuscitate: Secondary | ICD-10-CM | POA: Diagnosis present

## 2017-12-15 DIAGNOSIS — N179 Acute kidney failure, unspecified: Secondary | ICD-10-CM | POA: Diagnosis present

## 2017-12-15 DIAGNOSIS — Z79891 Long term (current) use of opiate analgesic: Secondary | ICD-10-CM

## 2017-12-15 DIAGNOSIS — E7220 Disorder of urea cycle metabolism, unspecified: Secondary | ICD-10-CM

## 2017-12-15 DIAGNOSIS — K7460 Unspecified cirrhosis of liver: Secondary | ICD-10-CM | POA: Diagnosis present

## 2017-12-15 DIAGNOSIS — Z794 Long term (current) use of insulin: Secondary | ICD-10-CM

## 2017-12-15 DIAGNOSIS — R4182 Altered mental status, unspecified: Secondary | ICD-10-CM

## 2017-12-15 DIAGNOSIS — Z515 Encounter for palliative care: Secondary | ICD-10-CM

## 2017-12-15 DIAGNOSIS — K219 Gastro-esophageal reflux disease without esophagitis: Secondary | ICD-10-CM | POA: Diagnosis present

## 2017-12-15 DIAGNOSIS — F419 Anxiety disorder, unspecified: Secondary | ICD-10-CM | POA: Diagnosis present

## 2017-12-15 DIAGNOSIS — Z833 Family history of diabetes mellitus: Secondary | ICD-10-CM

## 2017-12-15 DIAGNOSIS — L03116 Cellulitis of left lower limb: Secondary | ICD-10-CM | POA: Diagnosis present

## 2017-12-15 DIAGNOSIS — Z8249 Family history of ischemic heart disease and other diseases of the circulatory system: Secondary | ICD-10-CM

## 2017-12-15 DIAGNOSIS — D509 Iron deficiency anemia, unspecified: Secondary | ICD-10-CM | POA: Diagnosis present

## 2017-12-15 DIAGNOSIS — K2270 Barrett's esophagus without dysplasia: Secondary | ICD-10-CM | POA: Diagnosis present

## 2017-12-15 DIAGNOSIS — I872 Venous insufficiency (chronic) (peripheral): Secondary | ICD-10-CM | POA: Diagnosis present

## 2017-12-15 DIAGNOSIS — Z7189 Other specified counseling: Secondary | ICD-10-CM

## 2017-12-15 DIAGNOSIS — I878 Other specified disorders of veins: Secondary | ICD-10-CM | POA: Diagnosis present

## 2017-12-15 DIAGNOSIS — K922 Gastrointestinal hemorrhage, unspecified: Secondary | ICD-10-CM | POA: Diagnosis present

## 2017-12-15 DIAGNOSIS — G4730 Sleep apnea, unspecified: Secondary | ICD-10-CM | POA: Diagnosis present

## 2017-12-15 DIAGNOSIS — D62 Acute posthemorrhagic anemia: Secondary | ICD-10-CM | POA: Diagnosis present

## 2017-12-15 DIAGNOSIS — K7290 Hepatic failure, unspecified without coma: Principal | ICD-10-CM | POA: Diagnosis present

## 2017-12-15 DIAGNOSIS — E1122 Type 2 diabetes mellitus with diabetic chronic kidney disease: Secondary | ICD-10-CM | POA: Diagnosis present

## 2017-12-15 DIAGNOSIS — Z6841 Body Mass Index (BMI) 40.0 and over, adult: Secondary | ICD-10-CM

## 2017-12-15 DIAGNOSIS — N189 Chronic kidney disease, unspecified: Secondary | ICD-10-CM | POA: Diagnosis present

## 2017-12-15 DIAGNOSIS — K7682 Hepatic encephalopathy: Secondary | ICD-10-CM

## 2017-12-15 DIAGNOSIS — E785 Hyperlipidemia, unspecified: Secondary | ICD-10-CM | POA: Diagnosis present

## 2017-12-15 DIAGNOSIS — D649 Anemia, unspecified: Secondary | ICD-10-CM

## 2017-12-15 DIAGNOSIS — I129 Hypertensive chronic kidney disease with stage 1 through stage 4 chronic kidney disease, or unspecified chronic kidney disease: Secondary | ICD-10-CM | POA: Diagnosis present

## 2017-12-15 DIAGNOSIS — E1121 Type 2 diabetes mellitus with diabetic nephropathy: Secondary | ICD-10-CM | POA: Diagnosis present

## 2017-12-15 DIAGNOSIS — K31819 Angiodysplasia of stomach and duodenum without bleeding: Secondary | ICD-10-CM | POA: Diagnosis present

## 2017-12-15 DIAGNOSIS — Z79899 Other long term (current) drug therapy: Secondary | ICD-10-CM

## 2017-12-15 DIAGNOSIS — F17290 Nicotine dependence, other tobacco product, uncomplicated: Secondary | ICD-10-CM | POA: Diagnosis present

## 2017-12-15 LAB — CBC
HEMATOCRIT: 22.2 % — AB (ref 40.0–52.0)
HEMOGLOBIN: 7.2 g/dL — AB (ref 13.0–18.0)
MCH: 30.2 pg (ref 26.0–34.0)
MCHC: 32.6 g/dL (ref 32.0–36.0)
MCV: 92.5 fL (ref 80.0–100.0)
Platelets: 114 10*3/uL — ABNORMAL LOW (ref 150–440)
RBC: 2.4 MIL/uL — ABNORMAL LOW (ref 4.40–5.90)
RDW: 16.9 % — AB (ref 11.5–14.5)
WBC: 6 10*3/uL (ref 3.8–10.6)

## 2017-12-15 NOTE — ED Triage Notes (Addendum)
Ems pt from home, AMS and fall , EMS and neighbors believe he has been on the floor for at least a day, the pt believes it is Saturday. Pt awake but confused to date, time , and situation.  EMS was able contact family at the scene and had stated that he has been a recent pt to [place] for abnormal Ammonia levels and bilateral lower extremity swelling.  Ems reports CBG 276, BP 148/52, HR 96 ,RA sat 96% , 18 gauge left wrist

## 2017-12-15 NOTE — ED Provider Notes (Signed)
[facility] Emergency Department Provider Note   ____________________________________________   First MD Initiated Contact with Patient 12/15/17 2313     (approximate)  I have reviewed the triage vital signs and the nursing notes.   HISTORY  Chief Complaint Altered Mental Status  Level 5 caveat: Limited by altered mentation  HPI Corey West is a 67 y.o. male brought to the ED from home via EMS with a chief complaint of altered mentation and fall.  Patient has a history of anemia requiring transfusion, AVM, Barrett's esophagus, CKD, cirrhosis, esophageal varices whose neighbor did a welfare check on him.  Neighbor told EMS she believed that patient had been on the floor for at least a day.  Patient has had recent trips to [facility] and [facility] for abnormal ammonia levels and bilateral lower extremity swelling.  From the records it looks like patient was recently admitted to this facility for anemia requiring transfusion, generalized weakness and shortness of breath.  Patient is confused and unable to provide any history.  He thinks he fell and tells me he knows his neighbor was checking on him.  Denies striking head or LOC.  Reports compliance with lactulose.   Past Medical History:  Diagnosis Date  . Anemia   . Anxiety    controlled;   . Arthritis   . AVM (arteriovenous malformation) of stomach, acquired with hemorrhage   . Barrett's esophagus   . Chronic kidney disease    renal infufficiency  . Cirrhosis ([place])   . Depression    controlled;   Marland Kitchen Diabetes mellitus without complication ([place])    not controlled, taking insulin but sugar continues to run high;   . Edema   . Esophageal varices ([place])   . GERD (gastroesophageal reflux disease)   . History of hiatal hernia   . Hyperlipidemia   . Hypertension    controlled well;   . 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 Obesity   . Pancytopenia ([place])   . Polyp, stomach    with chronic blood loss  . Sleep apnea    does not wear a cpap, Medicare would not pay for it   . Venous stasis dermatitis of both lower extremities   . Venous stasis of both lower extremities    with cellulitis    Patient Active Problem List   Diagnosis Date Noted  . Hepatic encephalopathy ([place]) 10/18/2017  . Hypokalemia 10/09/2017  . Hyperglycemia 10/09/2017  . Symptomatic anemia 06/26/2017  . Sepsis ([place]) 06/12/2017  . Cellulitis in diabetic foot ([place]) 02/21/2017  . Uncontrolled diabetes mellitus ([place]) 02/21/2017  . Cellulitis 02/21/2017  . GI bleed 07/20/2015  . Hematemesis 02/08/2015  . Cirrhosis ([place]) 02/08/2015  . Esophageal varices ([place]) 02/08/2015  . Type 2 diabetes mellitus ([place]) 02/08/2015  . Gastroesophageal reflux disease 02/08/2015  . Hyperlipidemia 02/08/2015  . Iron deficiency anemia 12/04/2013  . Thrombocytopenia ([place]) 12/04/2013  . Splenomegaly 11/04/2013    Past Surgical History:  Procedure Laterality Date  . ESOPHAGOGASTRODUODENOSCOPY N/A 02/09/2015   Procedure: ESOPHAGOGASTRODUODENOSCOPY (EGD);  Surgeon: Manya Silvas, MD;  Location[facility] ENDOSCOPY;  Service: Endoscopy;  Laterality: N/A;  . ESOPHAGOGASTRODUODENOSCOPY N/A 07/22/2015   Procedure: ESOPHAGOGASTRODUODENOSCOPY (EGD);  Surgeon: Manya Silvas, MD;  Location: [facility] ENDOSCOPY;  Service: Endoscopy;  Laterality: N/A;  . ESOPHAGOGASTRODUODENOSCOPY N/A 06/26/2017   Procedure: ESOPHAGOGASTRODUODENOSCOPY (EGD);  Surgeon: Manya Silvas, MD;  Location: [facility] ENDOSCOPY;  Service: Endoscopy;  Laterality: N/A;  . ESOPHAGOGASTRODUODENOSCOPY N/A 06/27/2017   Procedure: ESOPHAGOGASTRODUODENOSCOPY (EGD);  Surgeon: Manya Silvas, MD;  Location: [facility] ENDOSCOPY;  Service: Endoscopy;  Laterality: N/A;  . ESOPHAGOGASTRODUODENOSCOPY N/A 06/28/2017   Procedure: ESOPHAGOGASTRODUODENOSCOPY (EGD);  Surgeon: Manya Silvas, MD;  Location: [facility] ENDOSCOPY;  Service: Endoscopy;  Laterality: N/A;  . ESOPHAGOGASTRODUODENOSCOPY Bilateral 10/11/2017   Procedure:  ESOPHAGOGASTRODUODENOSCOPY (EGD);  Surgeon: Manya Silvas, MD;  Location: [facility] ENDOSCOPY;  Service: Endoscopy;  Laterality: Bilateral;  . ESOPHAGOGASTRODUODENOSCOPY N/A 12/04/2017   Procedure: ESOPHAGOGASTRODUODENOSCOPY (EGD);  Surgeon: Manya Silvas, MD;  Location: [facility] ENDOSCOPY;  Service: Endoscopy;  Laterality: N/A;  . ESOPHAGOGASTRODUODENOSCOPY (EGD) WITH PROPOFOL N/A 07/20/2015   Procedure: ESOPHAGOGASTRODUODENOSCOPY (EGD) WITH PROPOFOL;  Surgeon: Manya Silvas, MD;  Location: [facility] ENDOSCOPY;  Service: Endoscopy;  Laterality: N/A;  . ESOPHAGOGASTRODUODENOSCOPY (EGD) WITH PROPOFOL N/A 09/16/2015   Procedure: ESOPHAGOGASTRODUODENOSCOPY (EGD) WITH PROPOFOL;  Surgeon: Manya Silvas, MD;  Location: [facility] ENDOSCOPY;  Service: Endoscopy;  Laterality: N/A;  . ESOPHAGOGASTRODUODENOSCOPY (EGD) WITH PROPOFOL N/A 03/16/2016   Procedure: ESOPHAGOGASTRODUODENOSCOPY (EGD) WITH PROPOFOL;  Surgeon: Manya Silvas, MD;  Location: [facility] ENDOSCOPY;  Service: Endoscopy;  Laterality: N/A;  . ESOPHAGOGASTRODUODENOSCOPY (EGD) WITH PROPOFOL N/A 09/14/2016   Procedure: ESOPHAGOGASTRODUODENOSCOPY (EGD) WITH PROPOFOL;  Surgeon: Manya Silvas, MD;  Location: [facility] ENDOSCOPY;  Service: Endoscopy;  Laterality: N/A;  . ESOPHAGOGASTRODUODENOSCOPY (EGD) WITH PROPOFOL N/A 11/05/2016   Procedure: ESOPHAGOGASTRODUODENOSCOPY (EGD) WITH PROPOFOL;  Surgeon: Manya Silvas, MD;  Location: [facility] ENDOSCOPY;  Service: Endoscopy;  Laterality: N/A;  . ESOPHAGOGASTRODUODENOSCOPY (EGD) WITH PROPOFOL N/A 02/06/2017   Procedure: ESOPHAGOGASTRODUODENOSCOPY (EGD) WITH PROPOFOL;  Surgeon: Manya Silvas, MD;  Location: [facility] ENDOSCOPY;  Service: Endoscopy;  Laterality: N/A;  . ESOPHAGOGASTRODUODENOSCOPY (EGD) WITH PROPOFOL N/A 04/24/2017   Procedure: ESOPHAGOGASTRODUODENOSCOPY (EGD) WITH PROPOFOL;  Surgeon: Manya Silvas, MD;  Location: [facility] ENDOSCOPY;  Service: Endoscopy;  Laterality: N/A;  . ESOPHAGOGASTRODUODENOSCOPY (EGD) WITH  PROPOFOL N/A 07/31/2017   Procedure: ESOPHAGOGASTRODUODENOSCOPY (EGD) WITH PROPOFOL;  Surgeon: Manya Silvas, MD;  Location: [facility] ENDOSCOPY;  Service: Endoscopy;  Laterality: N/A;  . GIVENS CAPSULE STUDY N/A 12/05/2017   Procedure: GIVENS CAPSULE STUDY;  Surgeon: Manya Silvas, MD;  Location: [facility] ENDOSCOPY;  Service: Endoscopy;  Laterality: N/A;  . TONSILLECTOMY    . TONSILLECTOMY AND ADENOIDECTOMY    . ULNAR NERVE TRANSPOSITION    . UVULOPALATOPHARYNGOPLASTY      Prior to Admission medications   Medication Sig Start Date End Date Taking? Authorizing Provider  FERROUS SULFATE ER PO Take 325 mg by mouth 2 (two) times daily.     [provider]  furosemide (LASIX) 40 MG tablet Take 40 mg by mouth 2 (two) times daily.    [provider]  insulin aspart protamine- aspart (NOVOLOG MIX 70/30) (70-30) 100 UNIT/ML injection Inject 0.24-0.84 mLs (24-84 Units total) into the skin 2 (two) times daily with a meal. 12/05/17   Leslye Peer, Richard, MD  lactulose (CHRONULAC) 10 GM/15ML solution Take 30 g by mouth 6 (six) times daily.     [provider]  LORazepam (ATIVAN) 0.5 MG tablet Take 0.5 mg by mouth 3 (three) times daily as needed for anxiety.     [provider]  Magnesium Oxide 400 (240 Mg) MG TABS Take 1 tablet (400 mg total) by mouth every morning. 12/05/17   Loletha Grayer, MD  Multiple Vitamin (MULTIVITAMIN) tablet Take 1 tablet by mouth daily.    [provider]  pantoprazole (PROTONIX) 40 MG tablet Take 1 tablet (40 mg total) by mouth daily. 12/05/17 12/05/18  Loletha Grayer, MD  potassium chloride SA (K-DUR,KLOR-CON) 20 MEQ tablet Take 2 tablets (40 mEq total) by mouth 2 (two) times daily. 12/05/17   Loletha Grayer, MD  rifaximin (XIFAXAN) 550 MG TABS tablet Take 550 mg by mouth 2 (two) times daily.    [provider]  spironolactone (ALDACTONE) 25 MG tablet Take 1 tablet (25 mg total) by mouth 2 (two) times daily. 12/05/17   Loletha Grayer, MD  traMADol (ULTRAM) 50 MG tablet Take 50 mg by mouth every 8 (eight) hours as needed.     [provider]    Allergies Patient has no known allergies.  Family History  Problem Relation Age of Onset  . Diabetes Other   . Transient ischemic attack Father   . CAD Father     Social History Social History   Tobacco Use  . Smoking status: Current Some Day Smoker    Types: Cigars  . Smokeless tobacco: Never Used  . Tobacco comment: smoke 1 at night occasionally;   Substance Use Topics  . Alcohol use: No    Comment: stopped 12 years ago  . Drug use: No    Review of Systems  Constitutional: Positive for generalized weakness. No fever/chills. Eyes: No visual changes. ENT: No sore throat. Cardiovascular: Denies chest pain. Respiratory: Denies shortness of breath. Gastrointestinal: No abdominal pain.  No nausea, no vomiting.  No diarrhea.  No constipation. Genitourinary: Negative for dysuria. Musculoskeletal: Positive for leg swelling. Negative for back pain. Skin: Negative for rash. Neurological: Positive for altered mentation. Negative for headaches, focal weakness or numbness.   ____________________________________________   PHYSICAL EXAM:  VITAL SIGNS: ED Triage Vitals  Enc Vitals Group     BP 12/15/17 2305 (!) 137/58     Pulse Rate 12/15/17 2305 91     Resp 12/15/17 2305 18     Temp 12/15/17 2305 98.4 F (36.9 C)     Temp Source 12/15/17 2305 Oral     SpO2 12/15/17 2305 99 %     Weight 12/15/17 2306 (!) 317 lb (143.8 kg)     Height 12/15/17 2306 5\' 9"  (1.753 m)     Head Circumference --      Peak Flow --      Pain Score 12/15/17 2306 0     Pain Loc --      Pain Edu? --      Excl. in [place]? --     Constitutional: Alert and confused.  Chronically ill appearing and in mild acute distress. Eyes: Conjunctivae are normal. PERRL. EOMI. Head: Atraumatic. Nose: No congestion/rhinnorhea. Mouth/Throat: Mucous membranes are moist.  Oropharynx  non-erythematous. Neck: No stridor.  No carotid bruits. Cardiovascular: Normal rate, regular rhythm. Grossly normal heart sounds.  Good peripheral circulation. Respiratory: Normal respiratory effort.  No retractions. Lungs CTAB. Gastrointestinal: Obese.  Soft and nontender. No distention. No abdominal bruits. No CVA tenderness. Musculoskeletal: BLE edema; wrapped in compression bandages.  No joint effusions. Neurologic: Alert and oriented to person and place.  CN II-XII grossly intact.  Notable cognitive motor slowing.  Slow speech and language. No gross focal neurologic deficits are appreciated. MAEx4. Skin:  Skin is pale l, warm, dry and intact. No rash noted. Psychiatric: Mood and affect are normal. Speech and behavior are normal.  ____________________________________________   LABS (all labs ordered are listed, but only abnormal results are displayed)  Labs Reviewed  COMPREHENSIVE METABOLIC PANEL - Abnormal; Notable for the following components:      Result Value   Glucose, Bld 240 (*)  BUN 29 (*)    Creatinine, Ser 2.28 (*)    Albumin 2.6 (*)    GFR calc non Af Amer 28 (*)    GFR calc Af Amer 33 (*)    All other components within normal limits  CBC - Abnormal; Notable for the following components:   RBC 2.40 (*)    Hemoglobin 7.2 (*)    HCT 22.2 (*)    RDW 16.9 (*)    Platelets 114 (*)    All other components within normal limits  LACTIC ACID, PLASMA - Abnormal; Notable for the following components:   Lactic Acid, Venous 3.9 (*)    All other components within normal limits  PROTIME-INR - Abnormal; Notable for the following components:   Prothrombin Time 15.7 (*)    All other components within normal limits  AMMONIA - Abnormal; Notable for the following components:   Ammonia 123 (*)    All other components within normal limits  ETHANOL  LIPASE, BLOOD  TROPONIN I  CK  LACTIC ACID, PLASMA  CBG MONITORING, ED  PREPARE RBC (CROSSMATCH)    ____________________________________________  EKG  ED ECG REPORT I, Shemekia Patane J, the attending physician, personally viewed and interpreted this ECG.   Date: 12/15/2017  EKG Time: 2253  Rate: 93  Rhythm: normal EKG, normal sinus rhythm  Axis: Normal   Intervals:none  ST&T Change: Nonspecific  ____________________________________________  RADIOLOGY  ED MD interpretation: No ICH, no acute cardiopulmonary process  Official radiology report(s): Ct Head Wo Contrast  Result Date: 12/15/2017 CLINICAL DATA:  Altered level of consciousness. EXAM: CT HEAD WITHOUT CONTRAST TECHNIQUE: Contiguous axial images were obtained from the base of the skull through the vertex without intravenous contrast. COMPARISON:  Head CT 10/17/2017 FINDINGS: Brain: Mild atrophy is unchanged from prior exam. No intracranial hemorrhage, mass effect, or midline shift. No hydrocephalus. The basilar cisterns are patent. No evidence of territorial infarct or acute ischemia. No extra-axial or intracranial fluid collection. Vascular: No hyperdense vessel or unexpected calcification. Skull: No fracture or focal lesion. Sinuses/Orbits: Paranasal sinuses and mastoid air cells are clear. The visualized orbits are unremarkable. Other: None. IMPRESSION: 1.  No acute intracranial abnormality. 2. Mild atrophy is unchanged from prior. Electronically Signed   By: Jeb Levering M.D.   On: 12/15/2017 23:57   Dg Chest Port 1 View  Result Date: 12/16/2017 CLINICAL DATA:  Confusion/altered mental status EXAM: PORTABLE CHEST 1 VIEW COMPARISON:  December 02, 2017 FINDINGS: There is no edema or consolidation. Heart is mildly enlarged with pulmonary vascularity within normal limits. No adenopathy. No bone lesions. No pneumothorax. IMPRESSION: Stable cardiac prominence.  No edema or consolidation. Electronically Signed   By: Lowella Grip III M.D.   On: 12/16/2017 00:00     ____________________________________________   PROCEDURES  Procedure(s) performed: None  Procedures  Critical Care performed: Yes, see critical care note(s)   CRITICAL CARE Performed by: Paulette Blanch   Total critical care time: 30 minutes  Critical care time was exclusive of separately billable procedures and treating other patients.  Critical care was necessary to treat or prevent imminent or life-threatening deterioration.  Critical care was time spent personally by me on the following activities: development of treatment plan with patient and/or surrogate as well as nursing, discussions with consultants, evaluation of patient's response to treatment, examination of patient, obtaining history from patient or surrogate, ordering and performing treatments and interventions, ordering and review of laboratory studies, ordering and review of radiographic studies, pulse oximetry and re-evaluation of  patient's condition.  ____________________________________________   INITIAL IMPRESSION / ASSESSMENT AND PLAN / ED COURSE  As part of my medical decision making, I reviewed the following data within the [place] notes reviewed and incorporated, Labs reviewed, EKG interpreted, Old chart reviewed, Discussed with admitting physician and Notes from prior ED visits   67 year old male with cirrhosis, esophageal varices, AVM, and anemia requiring transfusion not on anticoagulants who presents with altered mentation and probable fall. Differential diagnosis includes, but is not limited to, alcohol, illicit or prescription medications, or other toxic ingestion; intracranial pathology such as stroke or intracerebral hemorrhage; fever or infectious causes including sepsis; hypoxemia and/or hypercarbia; uremia; trauma; endocrine related disorders such as diabetes, hypoglycemia, and thyroid-related diseases; hypertensive encephalopathy; etc.  Will obtain screening lab  work, urinalysis, CT head.  Anticipate hospitalization.   Clinical Course as of Dec 17 30  Mon Dec 16, 2017  0030 Laboratory results notable for anemia requiring transfusion, hyperammonemia and elevated lactic acid which is most likely secondary to elevated ammonia level.  Will transfuse 2 units PRBCs with Lasix in between; administer lactulose.  Will discuss with hospitalist to evaluate patient in the emergency department for admission.  [JS]    Clinical Course User Index [JS] Paulette Blanch, MD     ____________________________________________   FINAL CLINICAL IMPRESSION(S) / ED DIAGNOSES  Final diagnoses:  Altered mental status, unspecified altered mental status type  Anemia, unspecified type  Hepatic encephalopathy ([place])  Hyperammonemia [facility])     ED Discharge Orders    None       Note:  This document was prepared using Dragon voice recognition software and may include unintentional dictation errors.    Paulette Blanch, MD 12/16/17 [phone]

## 2017-12-15 NOTE — ED Notes (Signed)
Patient transported to CT 

## 2017-12-15 NOTE — Progress Notes (Deleted)
[place]  [phone] [phone]  ID: Corey West OB: 1950-10-09  MR#: [id number]  URK#:[id number]  Patient Care Team: Baxter Hire, MD as PCP - General (Internal Medicine)  CHIEF COMPLAINT: Iron deficiency anemia, thrombocytopenia.  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  He was last evaluated in the cancer center greater than 3 years ago.  Patient was recently admitted to the hospital with worsening shortness of breath and weakness.  He is found to have a decreased hemoglobin of 7.6 and was given 1 unit of packed red blood cells.  He also was found to have severe hypokalemia.  Currently, patient feels improved but not back to his baseline.  He has no neurologic complaints.  He denies any recent fevers or illnesses.  He has a fair appetite, but denies weight loss.  He denies any chest pain or hemoptysis.  He denies any nausea, vomiting, constipation, or diarrhea.  He admits to black stools, but is unclear if this is blood or secondary to his iron supplementation.  He has no urinary complaints.  Patient otherwise feels well and offers no further specific complaints.  REVIEW OF SYSTEMS:   ROS  As per HPI. Otherwise, a complete review of systems is negative.  PAST MEDICAL HISTORY: Past Medical History:  Diagnosis Date  . Anemia   . Anxiety    controlled;   . Arthritis   . AVM (arteriovenous malformation) of stomach, acquired with hemorrhage   . Barrett's esophagus   . Chronic kidney disease    renal infufficiency  . Cirrhosis ([place])   . Depression    controlled;   Marland Kitchen Diabetes mellitus without complication ([place])    not controlled, taking insulin but sugar continues to run high;   . Edema   . Esophageal varices ([place])   . GERD (gastroesophageal reflux disease)   . History of hiatal  hernia   . Hyperlipidemia   . Hypertension    controlled well;   . Nephropathy, diabetic ([place])   . Obesity   . Pancytopenia ([place])   . Polyp, stomach    with chronic blood loss  . Sleep apnea    does not wear a cpap, Medicare would not pay for it   . Venous stasis dermatitis of both lower extremities   . Venous stasis of both lower extremities    with cellulitis    PAST SURGICAL HISTORY: Past Surgical History:  Procedure Laterality Date  . ESOPHAGOGASTRODUODENOSCOPY N/A 02/09/2015   Procedure: ESOPHAGOGASTRODUODENOSCOPY (EGD);  Surgeon: Manya Silvas, MD;  Location[facility] ENDOSCOPY;  Service: Endoscopy;  Laterality: N/A;  . ESOPHAGOGASTRODUODENOSCOPY N/A 07/22/2015   Procedure: ESOPHAGOGASTRODUODENOSCOPY (EGD);  Surgeon: Manya Silvas, MD;  Location: [facility] ENDOSCOPY;  Service: Endoscopy;  Laterality: N/A;  . ESOPHAGOGASTRODUODENOSCOPY N/A 06/26/2017   Procedure: ESOPHAGOGASTRODUODENOSCOPY (EGD);  Surgeon: Manya Silvas, MD;  Location: [facility] ENDOSCOPY;  Service: Endoscopy;  Laterality: N/A;  . ESOPHAGOGASTRODUODENOSCOPY N/A 06/27/2017   Procedure: ESOPHAGOGASTRODUODENOSCOPY (EGD);  Surgeon: Manya Silvas, MD;  Location: [facility] ENDOSCOPY;  Service: Endoscopy;  Laterality: N/A;  . ESOPHAGOGASTRODUODENOSCOPY N/A 06/28/2017   Procedure: ESOPHAGOGASTRODUODENOSCOPY (EGD);  Surgeon: Manya Silvas, MD;  Location: [facility] ENDOSCOPY;  Service: Endoscopy;  Laterality: N/A;  . ESOPHAGOGASTRODUODENOSCOPY Bilateral 10/11/2017   Procedure: ESOPHAGOGASTRODUODENOSCOPY (EGD);  Surgeon: Manya Silvas, MD;  Location: [facility] ENDOSCOPY;  Service: Endoscopy;  Laterality: Bilateral;  .  ESOPHAGOGASTRODUODENOSCOPY N/A 12/04/2017   Procedure: ESOPHAGOGASTRODUODENOSCOPY (EGD);  Surgeon: Manya Silvas, MD;  Location: [facility] ENDOSCOPY;  Service: Endoscopy;  Laterality: N/A;  . ESOPHAGOGASTRODUODENOSCOPY (EGD) WITH PROPOFOL N/A 07/20/2015   Procedure: ESOPHAGOGASTRODUODENOSCOPY (EGD) WITH PROPOFOL;  Surgeon:  Manya Silvas, MD;  Location: [facility] ENDOSCOPY;  Service: Endoscopy;  Laterality: N/A;  . ESOPHAGOGASTRODUODENOSCOPY (EGD) WITH PROPOFOL N/A 09/16/2015   Procedure: ESOPHAGOGASTRODUODENOSCOPY (EGD) WITH PROPOFOL;  Surgeon: Manya Silvas, MD;  Location: [facility] ENDOSCOPY;  Service: Endoscopy;  Laterality: N/A;  . ESOPHAGOGASTRODUODENOSCOPY (EGD) WITH PROPOFOL N/A 03/16/2016   Procedure: ESOPHAGOGASTRODUODENOSCOPY (EGD) WITH PROPOFOL;  Surgeon: Manya Silvas, MD;  Location: [facility] ENDOSCOPY;  Service: Endoscopy;  Laterality: N/A;  . ESOPHAGOGASTRODUODENOSCOPY (EGD) WITH PROPOFOL N/A 09/14/2016   Procedure: ESOPHAGOGASTRODUODENOSCOPY (EGD) WITH PROPOFOL;  Surgeon: Manya Silvas, MD;  Location: [facility] ENDOSCOPY;  Service: Endoscopy;  Laterality: N/A;  . ESOPHAGOGASTRODUODENOSCOPY (EGD) WITH PROPOFOL N/A 11/05/2016   Procedure: ESOPHAGOGASTRODUODENOSCOPY (EGD) WITH PROPOFOL;  Surgeon: Manya Silvas, MD;  Location: [facility] ENDOSCOPY;  Service: Endoscopy;  Laterality: N/A;  . ESOPHAGOGASTRODUODENOSCOPY (EGD) WITH PROPOFOL N/A 02/06/2017   Procedure: ESOPHAGOGASTRODUODENOSCOPY (EGD) WITH PROPOFOL;  Surgeon: Manya Silvas, MD;  Location: [facility] ENDOSCOPY;  Service: Endoscopy;  Laterality: N/A;  . ESOPHAGOGASTRODUODENOSCOPY (EGD) WITH PROPOFOL N/A 04/24/2017   Procedure: ESOPHAGOGASTRODUODENOSCOPY (EGD) WITH PROPOFOL;  Surgeon: Manya Silvas, MD;  Location: [facility] ENDOSCOPY;  Service: Endoscopy;  Laterality: N/A;  . ESOPHAGOGASTRODUODENOSCOPY (EGD) WITH PROPOFOL N/A 07/31/2017   Procedure: ESOPHAGOGASTRODUODENOSCOPY (EGD) WITH PROPOFOL;  Surgeon: Manya Silvas, MD;  Location: [facility] ENDOSCOPY;  Service: Endoscopy;  Laterality: N/A;  . GIVENS CAPSULE STUDY N/A 12/05/2017   Procedure: GIVENS CAPSULE STUDY;  Surgeon: Manya Silvas, MD;  Location: [facility] ENDOSCOPY;  Service: Endoscopy;  Laterality: N/A;  . TONSILLECTOMY    . TONSILLECTOMY AND ADENOIDECTOMY    . ULNAR NERVE TRANSPOSITION    .  UVULOPALATOPHARYNGOPLASTY      FAMILY HISTORY: Family History  Problem Relation Age of Onset  . Diabetes Other   . Transient ischemic attack Father   . CAD Father     ADVANCED DIRECTIVES (Y/N):  N  HEALTH MAINTENANCE: Social History   Tobacco Use  . Smoking status: Current Some Day Smoker    Types: Cigars  . Smokeless tobacco: Never Used  . Tobacco comment: smoke 1 at night occasionally;   Substance Use Topics  . Alcohol use: No    Comment: stopped 12 years ago  . Drug use: No     Colonoscopy:  PAP:  Bone density:  Lipid panel:  No Known Allergies  Current Outpatient Medications  Medication Sig Dispense Refill  . FERROUS SULFATE ER PO Take 325 mg by mouth 2 (two) times daily.     . furosemide (LASIX) 40 MG tablet Take 40 mg by mouth 2 (two) times daily.    . insulin aspart protamine- aspart (NOVOLOG MIX 70/30) (70-30) 100 UNIT/ML injection Inject 0.24-0.84 mLs (24-84 Units total) into the skin 2 (two) times daily with a meal.    . lactulose (CHRONULAC) 10 GM/15ML solution Take 30 g by mouth 6 (six) times daily.     Marland Kitchen LORazepam (ATIVAN) 0.5 MG tablet Take 0.5 mg by mouth 3 (three) times daily as needed for anxiety.     . Magnesium Oxide 400 (240 Mg) MG TABS Take 1 tablet (400 mg total) by mouth every morning. 30 tablet 0  . Multiple Vitamin (MULTIVITAMIN) tablet Take 1 tablet by mouth daily.    . pantoprazole (PROTONIX)  40 MG tablet Take 1 tablet (40 mg total) by mouth daily. 30 tablet 0  . potassium chloride West (K-DUR,KLOR-CON) 20 MEQ tablet Take 2 tablets (40 mEq total) by mouth 2 (two) times daily. 120 tablet 0  . rifaximin (XIFAXAN) 550 MG TABS tablet Take 550 mg by mouth 2 (two) times daily.    Marland Kitchen spironolactone (ALDACTONE) 25 MG tablet Take 1 tablet (25 mg total) by mouth 2 (two) times daily. 60 tablet 0  . traMADol (ULTRAM) 50 MG tablet Take 50 mg by mouth every 8 (eight) hours as needed.      No current facility-administered medications for this visit.      OBJECTIVE: There were no vitals filed for this visit.   There is no height or weight on file to calculate BMI.    ECOG FS:{CHL ONC [id number]  General: Well-developed, well-nourished, no acute distress. Eyes: Pink conjunctiva, anicteric sclera. HEENT: Normocephalic, moist mucous membranes, clear oropharnyx. Lungs: Clear to auscultation bilaterally. Heart: Regular rate and rhythm. No rubs, murmurs, or gallops. Abdomen: Soft, nontender, nondistended. No organomegaly noted, normoactive bowel sounds. Musculoskeletal: No edema, cyanosis, or clubbing. Neuro: Alert, answering all questions appropriately. Cranial nerves grossly intact. Skin: No rashes or petechiae noted. Psych: Normal affect. Lymphatics: No cervical, calvicular, axillary or inguinal LAD.   LAB RESULTS:  Lab Results  Component Value Date   NA 138 12/05/2017   K 3.3 (L) 12/05/2017   CL 103 12/05/2017   CO2 29 12/05/2017   GLUCOSE 173 (H) 12/05/2017   BUN 21 (H) 12/05/2017   CREATININE 1.58 (H) 12/05/2017   CALCIUM 8.0 (L) 12/05/2017   PROT 6.5 12/02/2017   ALBUMIN 2.6 (L) 12/02/2017   AST 35 12/02/2017   ALT 20 12/02/2017   ALKPHOS 74 12/02/2017   BILITOT 0.8 12/02/2017   GFRNONAA 44 (L) 12/05/2017   GFRAA 51 (L) 12/05/2017    Lab Results  Component Value Date   WBC 5.1 12/04/2017   NEUTROABS 6.6 (H) 09/05/2017   HGB 7.4 (L) 12/12/2017   HCT 22.3 (L) 12/12/2017   MCV 90.1 12/04/2017   PLT 118 (L) 12/04/2017     STUDIES: Dg Chest 2 View  Result Date: 12/02/2017 CLINICAL DATA:  Cough and shortness of Breath EXAM: CHEST  2 VIEW COMPARISON:  11/22/2017 FINDINGS: Cardiac shadow is stable. The lungs are well aerated bilaterally. No focal infiltrate or sizable effusion is seen. Degenerative changes of the thoracic spine are noted. IMPRESSION: No active cardiopulmonary disease. Electronically Signed   By: Inez Catalina M.D.   On: 12/02/2017 12:52   Dg Chest 2 View  Result Date: 11/22/2017 CLINICAL DATA:   67 y/o  M; shortness of breath. EXAM: CHEST  2 VIEW COMPARISON:  11/21/2017 chest radiograph FINDINGS: Stable mild cardiomegaly given projection and technique. Pulmonary vascular congestion, no consolidation. Probable small right effusion. Bones are unremarkable. IMPRESSION: Stable mild cardiomegaly. Pulmonary vascular congestion. Probable small right effusion. Electronically Signed   By: Kristine Garbe M.D.   On: 11/22/2017 18:33   Dg Chest 2 View  Result Date: 11/21/2017 CLINICAL DATA:  Shortness of breath EXAM: CHEST  2 VIEW COMPARISON:  11/08/2017 FINDINGS: Mild cardiomegaly. Stable mediastinal contours. Low volume chest with prominent interstitial markings that is stable. No Kerley lines, effusion, or pneumothorax. Spondylosis with multilevel thoracic ankylosis. IMPRESSION: 1. Interstitial coarsening that could be congestive or bronchitic. 2. Mild cardiomegaly. Electronically Signed   By: Monte Fantasia M.D.   On: 11/21/2017 18:21    ASSESSMENT: Iron deficiency anemia, thrombocytopenia.  PLAN:    1.  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  Possible EGD and capsule endoscopy in the near future.  Patient's hemoglobin has improved to 8.5.  He does not require transfusion at this time.  He would definitely benefit from IV iron in the future.  He does not require bone marrow biopsy.  Will arrange follow-up in the cancer center 1-2 weeks after discharge for further evaluation and blood transfusion or IV iron as needed. 2.  Thrombocytopenia: Likely multifactorial including secondary to his known cirrhosis.  Patient's platelets appear to be at his baseline.  No intervention is needed.  Follow-up in the cancer center as above.   Patient expressed understanding and was in agreement with this plan. He also understands that He can call clinic at any time with any questions, concerns, or complaints.   Cancer Staging No matching  staging information was found for the patient.  Lloyd Huger, MD   12/15/2017 10:25 AM

## 2017-12-16 ENCOUNTER — Other Ambulatory Visit: Payer: Self-pay | Admitting: Oncology

## 2017-12-16 DIAGNOSIS — K7290 Hepatic failure, unspecified without coma: Secondary | ICD-10-CM | POA: Diagnosis present

## 2017-12-16 DIAGNOSIS — I878 Other specified disorders of veins: Secondary | ICD-10-CM | POA: Diagnosis present

## 2017-12-16 DIAGNOSIS — E1122 Type 2 diabetes mellitus with diabetic chronic kidney disease: Secondary | ICD-10-CM | POA: Diagnosis present

## 2017-12-16 DIAGNOSIS — Z794 Long term (current) use of insulin: Secondary | ICD-10-CM | POA: Diagnosis not present

## 2017-12-16 DIAGNOSIS — N179 Acute kidney failure, unspecified: Secondary | ICD-10-CM | POA: Diagnosis present

## 2017-12-16 DIAGNOSIS — E785 Hyperlipidemia, unspecified: Secondary | ICD-10-CM | POA: Diagnosis present

## 2017-12-16 DIAGNOSIS — Z515 Encounter for palliative care: Secondary | ICD-10-CM | POA: Diagnosis not present

## 2017-12-16 DIAGNOSIS — G4730 Sleep apnea, unspecified: Secondary | ICD-10-CM | POA: Diagnosis present

## 2017-12-16 DIAGNOSIS — Z6841 Body Mass Index (BMI) 40.0 and over, adult: Secondary | ICD-10-CM | POA: Diagnosis not present

## 2017-12-16 DIAGNOSIS — L03115 Cellulitis of right lower limb: Secondary | ICD-10-CM | POA: Diagnosis present

## 2017-12-16 DIAGNOSIS — K31819 Angiodysplasia of stomach and duodenum without bleeding: Secondary | ICD-10-CM | POA: Diagnosis present

## 2017-12-16 DIAGNOSIS — D649 Anemia, unspecified: Secondary | ICD-10-CM | POA: Diagnosis not present

## 2017-12-16 DIAGNOSIS — D62 Acute posthemorrhagic anemia: Secondary | ICD-10-CM | POA: Diagnosis present

## 2017-12-16 DIAGNOSIS — F419 Anxiety disorder, unspecified: Secondary | ICD-10-CM | POA: Diagnosis present

## 2017-12-16 DIAGNOSIS — N189 Chronic kidney disease, unspecified: Secondary | ICD-10-CM | POA: Diagnosis present

## 2017-12-16 DIAGNOSIS — I129 Hypertensive chronic kidney disease with stage 1 through stage 4 chronic kidney disease, or unspecified chronic kidney disease: Secondary | ICD-10-CM | POA: Diagnosis present

## 2017-12-16 DIAGNOSIS — Z7189 Other specified counseling: Secondary | ICD-10-CM | POA: Diagnosis not present

## 2017-12-16 DIAGNOSIS — E1121 Type 2 diabetes mellitus with diabetic nephropathy: Secondary | ICD-10-CM | POA: Diagnosis present

## 2017-12-16 DIAGNOSIS — K922 Gastrointestinal hemorrhage, unspecified: Secondary | ICD-10-CM | POA: Diagnosis present

## 2017-12-16 DIAGNOSIS — K7682 Hepatic encephalopathy: Secondary | ICD-10-CM | POA: Diagnosis present

## 2017-12-16 DIAGNOSIS — K2270 Barrett's esophagus without dysplasia: Secondary | ICD-10-CM | POA: Diagnosis present

## 2017-12-16 DIAGNOSIS — I872 Venous insufficiency (chronic) (peripheral): Secondary | ICD-10-CM | POA: Diagnosis present

## 2017-12-16 DIAGNOSIS — D509 Iron deficiency anemia, unspecified: Secondary | ICD-10-CM | POA: Diagnosis present

## 2017-12-16 DIAGNOSIS — K7460 Unspecified cirrhosis of liver: Secondary | ICD-10-CM | POA: Diagnosis not present

## 2017-12-16 DIAGNOSIS — Z833 Family history of diabetes mellitus: Secondary | ICD-10-CM | POA: Diagnosis not present

## 2017-12-16 DIAGNOSIS — L03116 Cellulitis of left lower limb: Secondary | ICD-10-CM | POA: Diagnosis present

## 2017-12-16 DIAGNOSIS — K219 Gastro-esophageal reflux disease without esophagitis: Secondary | ICD-10-CM | POA: Diagnosis present

## 2017-12-16 LAB — GLUCOSE, CAPILLARY
Glucose-Capillary: 150 mg/dL — ABNORMAL HIGH (ref 65–99)
Glucose-Capillary: 156 mg/dL — ABNORMAL HIGH (ref 65–99)
Glucose-Capillary: 158 mg/dL — ABNORMAL HIGH (ref 65–99)
Glucose-Capillary: 200 mg/dL — ABNORMAL HIGH (ref 65–99)
Glucose-Capillary: 227 mg/dL — ABNORMAL HIGH (ref 65–99)

## 2017-12-16 LAB — TROPONIN I: Troponin I: <0.03 ng/mL (ref <0.03)

## 2017-12-16 LAB — COMPREHENSIVE METABOLIC PANEL
ALBUMIN: 2.6 g/dL — AB (ref 3.5–5.0)
ALT: 21 U/L (ref 17–63)
ANION GAP: 10 (ref 5–15)
AST: 41 U/L (ref 15–41)
Alkaline Phosphatase: 72 U/L (ref 38–126)
BUN: 29 mg/dL — AB (ref 6–20)
CO2: 23 mmol/L (ref 22–32)
Calcium: 8.9 mg/dL (ref 8.9–10.3)
Chloride: 107 mmol/L (ref 101–111)
Creatinine, Ser: 2.28 mg/dL — ABNORMAL HIGH (ref 0.61–1.24)
GFR calc Af Amer: 33 mL/min — ABNORMAL LOW (ref >60)
GFR calc non Af Amer: 28 mL/min — ABNORMAL LOW (ref >60)
GLUCOSE: 240 mg/dL — AB (ref 65–99)
POTASSIUM: 4.4 mmol/L (ref 3.5–5.1)
SODIUM: 140 mmol/L (ref 135–145)
Total Bilirubin: 0.8 mg/dL (ref 0.3–1.2)
Total Protein: 7 g/dL (ref 6.5–8.1)

## 2017-12-16 LAB — LIPASE, BLOOD: LIPASE: 32 U/L (ref 11–51)

## 2017-12-16 LAB — PROTIME-INR
INR: 1.26
Prothrombin Time: 15.7 seconds — ABNORMAL HIGH (ref 11.4–15.2)

## 2017-12-16 LAB — HEMOGLOBIN AND HEMATOCRIT, BLOOD
HCT: 21.7 % — ABNORMAL LOW (ref 40.0–52.0)
HEMATOCRIT: 21.7 % — AB (ref 40.0–52.0)
HEMATOCRIT: 21.6 % — AB (ref 40.0–52.0)
HEMOGLOBIN: 7.3 g/dL — AB (ref 13.0–18.0)
HEMOGLOBIN: 7.4 g/dL — AB (ref 13.0–18.0)
Hemoglobin: 7.3 g/dL — ABNORMAL LOW (ref 13.0–18.0)

## 2017-12-16 LAB — ETHANOL: Alcohol, Ethyl (B): <10 mg/dL (ref <10)

## 2017-12-16 LAB — AMMONIA: Ammonia: 123 umol/L — ABNORMAL HIGH (ref 9–35)

## 2017-12-16 LAB — CK: Total CK: 88 U/L (ref 49–397)

## 2017-12-16 LAB — LACTIC ACID, PLASMA
LACTIC ACID, VENOUS: 1.1 mmol/L (ref 0.5–1.9)
Lactic Acid, Venous: 3.9 mmol/L (ref 0.5–1.9)

## 2017-12-16 LAB — PREPARE RBC (CROSSMATCH)

## 2017-12-16 MED ORDER — SPIRONOLACTONE 25 MG PO TABS
25.0000 mg | ORAL_TABLET | Freq: Two times a day (BID) | ORAL | Status: DC
  Administered 2017-12-16 – 2017-12-18 (×5): 25 mg via ORAL
  Filled 2017-12-16 (×5): qty 1

## 2017-12-16 MED ORDER — FUROSEMIDE 10 MG/ML IJ SOLN: 20.0000 mg | Freq: Once | INTRAMUSCULAR | Status: DC

## 2017-12-16 MED ORDER — VANCOMYCIN HCL IN DEXTROSE 1-5 GM/200ML-% IV SOLN
1000.0000 mg | Freq: Once | INTRAVENOUS | Status: AC
Start: 2017-12-16 — End: 2017-12-16
  Administered 2017-12-16: 1000 mg via INTRAVENOUS
  Filled 2017-12-16: qty 200

## 2017-12-16 MED ORDER — ONDANSETRON HCL 4 MG PO TABS
4.0000 mg | ORAL_TABLET | Freq: Four times a day (QID) | ORAL | Status: DC | PRN
  Administered 2017-12-18: 4 mg via ORAL
  Filled 2017-12-16: qty 1

## 2017-12-16 MED ORDER — FERROUS SULFATE 325 (65 FE) MG PO TABS
325.0000 mg | ORAL_TABLET | Freq: Two times a day (BID) | ORAL | Status: DC
  Administered 2017-12-16 – 2017-12-18 (×4): 325 mg via ORAL
  Filled 2017-12-16 (×4): qty 1

## 2017-12-16 MED ORDER — SODIUM CHLORIDE 0.9 % IV SOLN
10.0000 mL/h | Freq: Once | INTRAVENOUS | Status: AC
  Administered 2017-12-16: 10 mL/h via INTRAVENOUS

## 2017-12-16 MED ORDER — INSULIN ASPART 100 UNIT/ML ~~LOC~~ SOLN: 0.0000 [IU] | Freq: Every day | SUBCUTANEOUS | Status: DC

## 2017-12-16 MED ORDER — SODIUM CHLORIDE 0.9 % IV SOLN: 1.0000 g | Freq: Once | INTRAVENOUS | Status: DC

## 2017-12-16 MED ORDER — INSULIN GLARGINE 100 UNIT/ML ~~LOC~~ SOLN
10.0000 [IU] | Freq: Every day | SUBCUTANEOUS | Status: DC
  Administered 2017-12-16 – 2017-12-18 (×3): 10 [IU] via SUBCUTANEOUS
  Filled 2017-12-16 (×3): qty 0.1

## 2017-12-16 MED ORDER — CEPHALEXIN 500 MG PO CAPS
500.0000 mg | ORAL_CAPSULE | Freq: Three times a day (TID) | ORAL | Status: DC
  Administered 2017-12-16 – 2017-12-18 (×6): 500 mg via ORAL
  Filled 2017-12-16 (×5): qty 1

## 2017-12-16 MED ORDER — INSULIN ASPART PROT & ASPART (70-30 MIX) 100 UNIT/ML ~~LOC~~ SUSP: 24.0000 [IU] | Freq: Two times a day (BID) | SUBCUTANEOUS | Status: DC

## 2017-12-16 MED ORDER — LACTULOSE 10 GM/15ML PO SOLN
30.0000 g | Freq: Every day | ORAL | Status: DC
  Administered 2017-12-16 – 2017-12-17 (×7): 30 g via ORAL
  Filled 2017-12-16 (×7): qty 60

## 2017-12-16 MED ORDER — POTASSIUM CHLORIDE CRYS ER 20 MEQ PO TBCR
40.0000 meq | EXTENDED_RELEASE_TABLET | Freq: Two times a day (BID) | ORAL | Status: DC
  Administered 2017-12-16: 40 meq via ORAL
  Filled 2017-12-16: qty 2

## 2017-12-16 MED ORDER — VANCOMYCIN HCL IN DEXTROSE 1-5 GM/200ML-% IV SOLN: 1000.0000 mg | Freq: Once | INTRAVENOUS | Status: DC

## 2017-12-16 MED ORDER — SODIUM CHLORIDE 0.9 % IV SOLN: Freq: Once | INTRAVENOUS | Status: DC

## 2017-12-16 MED ORDER — VANCOMYCIN HCL IN DEXTROSE 1-5 GM/200ML-% IV SOLN
1000.0000 mg | Freq: Two times a day (BID) | INTRAVENOUS | Status: DC
  Administered 2017-12-16: 1000 mg via INTRAVENOUS
  Filled 2017-12-16 (×2): qty 200

## 2017-12-16 MED ORDER — RIFAXIMIN 550 MG PO TABS
550.0000 mg | ORAL_TABLET | Freq: Two times a day (BID) | ORAL | Status: DC
  Administered 2017-12-16 – 2017-12-18 (×4): 550 mg via ORAL
  Filled 2017-12-16 (×4): qty 1

## 2017-12-16 MED ORDER — ONDANSETRON HCL 4 MG/2ML IJ SOLN: 4.0000 mg | Freq: Four times a day (QID) | INTRAMUSCULAR | Status: DC | PRN

## 2017-12-16 MED ORDER — LACTATED RINGERS IV SOLN
INTRAVENOUS | Status: DC
  Administered 2017-12-16 – 2017-12-17 (×2): via INTRAVENOUS

## 2017-12-16 MED ORDER — PANTOPRAZOLE SODIUM 40 MG PO TBEC
40.0000 mg | DELAYED_RELEASE_TABLET | Freq: Every day | ORAL | Status: DC
  Administered 2017-12-17 – 2017-12-18 (×2): 40 mg via ORAL
  Filled 2017-12-16 (×2): qty 1

## 2017-12-16 MED ORDER — SODIUM CHLORIDE 0.9 % IV SOLN
1750.0000 mg | INTRAVENOUS | Status: DC
  Filled 2017-12-16: qty 1750

## 2017-12-16 MED ORDER — FUROSEMIDE 40 MG PO TABS
40.0000 mg | ORAL_TABLET | Freq: Two times a day (BID) | ORAL | Status: DC
  Administered 2017-12-16 – 2017-12-18 (×5): 40 mg via ORAL
  Filled 2017-12-16 (×5): qty 1

## 2017-12-16 MED ORDER — ADULT MULTIVITAMIN W/MINERALS CH
1.0000 | ORAL_TABLET | Freq: Every day | ORAL | Status: DC
  Administered 2017-12-17 – 2017-12-18 (×2): 1 via ORAL
  Filled 2017-12-16 (×2): qty 1

## 2017-12-16 MED ORDER — FUROSEMIDE 10 MG/ML IJ SOLN
40.0000 mg | Freq: Once | INTRAMUSCULAR | Status: AC
  Administered 2017-12-16: 40 mg via INTRAVENOUS
  Filled 2017-12-16: qty 4

## 2017-12-16 MED ORDER — LACTULOSE 10 GM/15ML PO SOLN
30.0000 g | Freq: Once | ORAL | Status: AC
  Administered 2017-12-16: 30 g via ORAL
  Filled 2017-12-16: qty 60

## 2017-12-16 MED ORDER — MAGNESIUM OXIDE 400 (241.3 MG) MG PO TABS
400.0000 mg | ORAL_TABLET | Freq: Every morning | ORAL | Status: DC
  Administered 2017-12-17 – 2017-12-18 (×2): 400 mg via ORAL
  Filled 2017-12-16 (×2): qty 1

## 2017-12-16 MED ORDER — INSULIN ASPART 100 UNIT/ML ~~LOC~~ SOLN
0.0000 [IU] | SUBCUTANEOUS | Status: DC
  Administered 2017-12-16 (×2): 3 [IU] via SUBCUTANEOUS
  Administered 2017-12-17: 8 [IU] via SUBCUTANEOUS
  Administered 2017-12-17 (×2): 2 [IU] via SUBCUTANEOUS
  Administered 2017-12-17: 3 [IU] via SUBCUTANEOUS
  Filled 2017-12-16 (×5): qty 1

## 2017-12-16 MED ORDER — SODIUM CHLORIDE 0.9 % IV SOLN
1.0000 g | Freq: Two times a day (BID) | INTRAVENOUS | Status: DC
  Administered 2017-12-16 (×2): 1 g via INTRAVENOUS
  Filled 2017-12-16 (×3): qty 1

## 2017-12-16 MED ORDER — INSULIN ASPART 100 UNIT/ML ~~LOC~~ SOLN
0.0000 [IU] | Freq: Three times a day (TID) | SUBCUTANEOUS | Status: DC
  Administered 2017-12-16: 4 [IU] via SUBCUTANEOUS
  Filled 2017-12-16: qty 1

## 2017-12-16 NOTE — ED Notes (Signed)
Date and time results received: 12/16/17 0002  Test: Lactic Acid Critical Value: 3.9  Name of Provider Notified: Beather Arbour  Orders Received? Or Actions Taken?: None at this time

## 2017-12-16 NOTE — H&P (Signed)
[place] [facility] [place] NAME: Corey West    MR#:  [id number]  DATE OF BIRTH:  1951-06-15  DATE OF ADMISSION:  12/15/2017  PRIMARY CARE PHYSICIAN: Baxter Hire, MD   REQUESTING/REFERRING PHYSICIAN:   CHIEF COMPLAINT:   Chief Complaint  Patient presents with  . Altered Mental Status    HISTORY OF PRESENT ILLNESS: Corey West  is a 67 y.o. male with a known history per below, found by neighbor on the floor-possibly on the floor for 24 hours, EMS found the patient confused and disoriented, per records-patient was recently at [facility] as well as [place] for elevated ammonia level and lower extremity swelling, in the emergency room patient was confused and disoriented, ammonia level was 123, hemoglobin 7.2 trending downward compared to previous studies, CT head was negative, chest x-ray negative, EKG was benign, patient evaluated at the bedside, no apparent distress, is lethargic, able to answer very simple questions with slow mentation, denies pain, lower extremity leg wrappings noted but significant cellulitis extending to just below the level of the knee bilaterally, patient is now being admitted for acute recurrent hepatic encephalopathy, bilateral lower extremity cellulitis, and acute on chronic anemia.  PAST MEDICAL HISTORY:   Past Medical History:  Diagnosis Date  . Anemia   . Anxiety    controlled;   . Arthritis   . AVM (arteriovenous malformation) of stomach, acquired with hemorrhage   . Barrett's esophagus   . Chronic kidney disease    renal infufficiency  . Cirrhosis ([place])   . Depression    controlled;   Marland Kitchen Diabetes mellitus without complication ([place])    not controlled, taking insulin but sugar continues to run high;   . Edema   . Esophageal varices ([place])   . GERD (gastroesophageal reflux disease)   . History of hiatal hernia   . Hyperlipidemia   . Hypertension    controlled well;   . 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  Obesity   . Pancytopenia ([place])   . Polyp, stomach    with chronic blood loss  . Sleep apnea    does not wear a cpap, Medicare would not pay for it   . Venous stasis dermatitis of both lower extremities   . Venous stasis of both lower extremities    with cellulitis    PAST SURGICAL HISTORY:  Past Surgical History:  Procedure Laterality Date  . ESOPHAGOGASTRODUODENOSCOPY N/A 02/09/2015   Procedure: ESOPHAGOGASTRODUODENOSCOPY (EGD);  Surgeon: Manya Silvas, MD;  Location: [facility] ENDOSCOPY;  Service: Endoscopy;  Laterality: N/A;  . ESOPHAGOGASTRODUODENOSCOPY N/A 07/22/2015   Procedure: ESOPHAGOGASTRODUODENOSCOPY (EGD);  Surgeon: Manya Silvas, MD;  Location: [facility] ENDOSCOPY;  Service: Endoscopy;  Laterality: N/A;  . ESOPHAGOGASTRODUODENOSCOPY N/A 06/26/2017   Procedure: ESOPHAGOGASTRODUODENOSCOPY (EGD);  Surgeon: Manya Silvas, MD;  Location: [facility] ENDOSCOPY;  Service: Endoscopy;  Laterality: N/A;  . ESOPHAGOGASTRODUODENOSCOPY N/A 06/27/2017   Procedure: ESOPHAGOGASTRODUODENOSCOPY (EGD);  Surgeon: Manya Silvas, MD;  Location: [facility] ENDOSCOPY;  Service: Endoscopy;  Laterality: N/A;  . ESOPHAGOGASTRODUODENOSCOPY N/A 06/28/2017   Procedure: ESOPHAGOGASTRODUODENOSCOPY (EGD);  Surgeon: Manya Silvas, MD;  Location: [facility] ENDOSCOPY;  Service: Endoscopy;  Laterality: N/A;  . ESOPHAGOGASTRODUODENOSCOPY Bilateral 10/11/2017   Procedure: ESOPHAGOGASTRODUODENOSCOPY (EGD);  Surgeon: Manya Silvas, MD;  Location: [facility] ENDOSCOPY;  Service: Endoscopy;  Laterality: Bilateral;  . ESOPHAGOGASTRODUODENOSCOPY N/A 12/04/2017   Procedure: ESOPHAGOGASTRODUODENOSCOPY (EGD);  Surgeon: Manya Silvas, MD;  Location: [facility] ENDOSCOPY;  Service: Endoscopy;  Laterality: N/A;  .  ESOPHAGOGASTRODUODENOSCOPY (EGD) WITH PROPOFOL N/A 07/20/2015   Procedure: ESOPHAGOGASTRODUODENOSCOPY (EGD) WITH PROPOFOL;  Surgeon: Manya Silvas, MD;  Location: [facility] ENDOSCOPY;  Service: Endoscopy;  Laterality: N/A;  .  ESOPHAGOGASTRODUODENOSCOPY (EGD) WITH PROPOFOL N/A 09/16/2015   Procedure: ESOPHAGOGASTRODUODENOSCOPY (EGD) WITH PROPOFOL;  Surgeon: Manya Silvas, MD;  Location: [facility] ENDOSCOPY;  Service: Endoscopy;  Laterality: N/A;  . ESOPHAGOGASTRODUODENOSCOPY (EGD) WITH PROPOFOL N/A 03/16/2016   Procedure: ESOPHAGOGASTRODUODENOSCOPY (EGD) WITH PROPOFOL;  Surgeon: Manya Silvas, MD;  Location: [facility] ENDOSCOPY;  Service: Endoscopy;  Laterality: N/A;  . ESOPHAGOGASTRODUODENOSCOPY (EGD) WITH PROPOFOL N/A 09/14/2016   Procedure: ESOPHAGOGASTRODUODENOSCOPY (EGD) WITH PROPOFOL;  Surgeon: Manya Silvas, MD;  Location: [facility] ENDOSCOPY;  Service: Endoscopy;  Laterality: N/A;  . ESOPHAGOGASTRODUODENOSCOPY (EGD) WITH PROPOFOL N/A 11/05/2016   Procedure: ESOPHAGOGASTRODUODENOSCOPY (EGD) WITH PROPOFOL;  Surgeon: Manya Silvas, MD;  Location: [facility] ENDOSCOPY;  Service: Endoscopy;  Laterality: N/A;  . ESOPHAGOGASTRODUODENOSCOPY (EGD) WITH PROPOFOL N/A 02/06/2017   Procedure: ESOPHAGOGASTRODUODENOSCOPY (EGD) WITH PROPOFOL;  Surgeon: Manya Silvas, MD;  Location: [facility] ENDOSCOPY;  Service: Endoscopy;  Laterality: N/A;  . ESOPHAGOGASTRODUODENOSCOPY (EGD) WITH PROPOFOL N/A 04/24/2017   Procedure: ESOPHAGOGASTRODUODENOSCOPY (EGD) WITH PROPOFOL;  Surgeon: Manya Silvas, MD;  Location: [facility] ENDOSCOPY;  Service: Endoscopy;  Laterality: N/A;  . ESOPHAGOGASTRODUODENOSCOPY (EGD) WITH PROPOFOL N/A 07/31/2017   Procedure: ESOPHAGOGASTRODUODENOSCOPY (EGD) WITH PROPOFOL;  Surgeon: Manya Silvas, MD;  Location: [facility] ENDOSCOPY;  Service: Endoscopy;  Laterality: N/A;  . GIVENS CAPSULE STUDY N/A 12/05/2017   Procedure: GIVENS CAPSULE STUDY;  Surgeon: Manya Silvas, MD;  Location: [facility] ENDOSCOPY;  Service: Endoscopy;  Laterality: N/A;  . TONSILLECTOMY    . TONSILLECTOMY AND ADENOIDECTOMY    . ULNAR NERVE TRANSPOSITION    . UVULOPALATOPHARYNGOPLASTY      SOCIAL HISTORY:  Social History   Tobacco Use  . Smoking status:  Current Some Day Smoker    Types: Cigars  . Smokeless tobacco: Never Used  . Tobacco comment: smoke 1 at night occasionally;   Substance Use Topics  . Alcohol use: No    Comment: stopped 12 years ago    FAMILY HISTORY:  Family History  Problem Relation Age of Onset  . Diabetes Other   . Transient ischemic attack Father   . CAD Father     DRUG ALLERGIES: NKDA  REVIEW OF SYSTEMS: Unable to be obtained given encephalopathy  MEDICATIONS AT HOME:  Prior to Admission medications   Medication Sig Start Date End Date Taking? Authorizing Provider  FERROUS SULFATE ER PO Take 325 mg by mouth 2 (two) times daily.     [provider]  furosemide (LASIX) 40 MG tablet Take 40 mg by mouth 2 (two) times daily.    [provider]  insulin aspart protamine- aspart (NOVOLOG MIX 70/30) (70-30) 100 UNIT/ML injection Inject 0.24-0.84 mLs (24-84 Units total) into the skin 2 (two) times daily with a meal. 12/05/17   Leslye Peer, Richard, MD  lactulose (CHRONULAC) 10 GM/15ML solution Take 30 g by mouth 6 (six) times daily.     [provider]  LORazepam (ATIVAN) 0.5 MG tablet Take 0.5 mg by mouth 3 (three) times daily as needed for anxiety.     [provider]  Magnesium Oxide 400 (240 Mg) MG TABS Take 1 tablet (400 mg total) by mouth every morning. 12/05/17   Loletha Grayer, MD  Multiple Vitamin (MULTIVITAMIN) tablet Take 1 tablet by mouth daily.    [provider]  pantoprazole (PROTONIX) 40 MG tablet Take 1 tablet (  40 mg total) by mouth daily. 12/05/17 12/05/18  Loletha Grayer, MD  potassium chloride SA (K-DUR,KLOR-CON) 20 MEQ tablet Take 2 tablets (40 mEq total) by mouth 2 (two) times daily. 12/05/17   Loletha Grayer, MD  rifaximin (XIFAXAN) 550 MG TABS tablet Take 550 mg by mouth 2 (two) times daily.    [provider]  spironolactone (ALDACTONE) 25 MG tablet Take 1 tablet (25 mg total) by mouth 2 (two) times daily. 12/05/17   Loletha Grayer, MD  traMADol  (ULTRAM) 50 MG tablet Take 50 mg by mouth every 8 (eight) hours as needed.     [provider]      PHYSICAL EXAMINATION:   VITAL SIGNS: Blood pressure (!) 141/58, pulse 78, temperature 98.4 F (36.9 C), temperature source Oral, resp. rate 17, height 5\' 9"  (1.753 m), weight (!) 143.8 kg (317 lb), SpO2 100 %.  GENERAL:  67 y.o.-year-old patient lying in the bed with no acute distress.  Extreme morbid obesity, nontoxic-appearing bilateral lower extremity leg lateral pins in place, bilateral EYES: Pupils equal, round, reactive to light and accommodation. No scleral icterus. Extraocular muscles intact.  HEENT: Head atraumatic, normocephalic. Oropharynx and nasopharynx clear.  NECK:  Supple, no jugular venous distention. No thyroid enlargement, no tenderness.  LUNGS: Normal breath sounds bilaterally, no wheezing, rales,rhonchi or crepitation. No use of accessory muscles of respiration.  CARDIOVASCULAR: S1, S2 normal. No murmurs, rubs, or gallops.  ABDOMEN: Soft, nontender, nondistended. Bowel sounds present. No organomegaly or mass.  EXTREMITIES: No pedal edema, cyanosis, or clubbing.  NEUROLOGIC: Cranial nerves II through XII are intact. MAES. Gait not checked.  PSYCHIATRIC: The patient is lethargic, arousable, confused and disoriented   SKIN: No obvious rash, lesion, or ulcer.  Lower extremity cellulitis extending to just below the knee  LABORATORY PANEL:   CBC Recent Labs  Lab 12/12/17 1207 12/15/17 2316  WBC  --  6.0  HGB 7.4* 7.2*  HCT 22.3* 22.2*  PLT  --  114*  MCV  --  92.5  MCH  --  30.2  MCHC  --  32.6  RDW  --  16.9*   ------------------------------------------------------------------------------------------------------------------  Chemistries  Recent Labs  Lab 12/15/17 2316  NA 140  K 4.4  CL 107  CO2 23  GLUCOSE 240*  BUN 29*  CREATININE 2.28*  CALCIUM 8.9  AST 41  ALT 21  ALKPHOS 72  BILITOT 0.8    ------------------------------------------------------------------------------------------------------------------ estimated creatinine clearance is 45.6 mL/min (A) (by C-G formula based on SCr of 2.28 mg/dL (H)). ------------------------------------------------------------------------------------------------------------------ No results for input(s): TSH, T4TOTAL, T3FREE, THYROIDAB in the last 72 hours.  Invalid input(s): FREET3   Coagulation profile Recent Labs  Lab 12/15/17 2316  INR 1.26   ------------------------------------------------------------------------------------------------------------------- No results for input(s): DDIMER in the last 72 hours. -------------------------------------------------------------------------------------------------------------------  Cardiac Enzymes Recent Labs  Lab 12/15/17 2316  TROPONINI <0.03   ------------------------------------------------------------------------------------------------------------------ Invalid input(s): POCBNP  ---------------------------------------------------------------------------------------------------------------  Urinalysis    Component Value Date/Time   COLORURINE YELLOW (A) 12/02/2017 1648   APPEARANCEUR CLEAR (A) 12/02/2017 1648   APPEARANCEUR Clear 09/10/2014 0456   LABSPEC 1.010 12/02/2017 1648   LABSPEC 1.016 09/10/2014 0456   PHURINE 6.0 12/02/2017 1648   GLUCOSEU NEGATIVE 12/02/2017 1648   GLUCOSEU Negative 09/10/2014 0456   HGBUR NEGATIVE 12/02/2017 1648   BILIRUBINUR NEGATIVE 12/02/2017 1648   BILIRUBINUR Negative 09/10/2014 0456   KETONESUR NEGATIVE 12/02/2017 1648   PROTEINUR NEGATIVE 12/02/2017 1648   NITRITE NEGATIVE 12/02/2017 1648   LEUKOCYTESUR NEGATIVE 12/02/2017 1648  LEUKOCYTESUR Negative 09/10/2014 0456     RADIOLOGY: Ct Head Wo Contrast  Result Date: 12/15/2017 CLINICAL DATA:  Altered level of consciousness. EXAM: CT HEAD WITHOUT CONTRAST TECHNIQUE: Contiguous  axial images were obtained from the base of the skull through the vertex without intravenous contrast. COMPARISON:  Head CT 10/17/2017 FINDINGS: Brain: Mild atrophy is unchanged from prior exam. No intracranial hemorrhage, mass effect, or midline shift. No hydrocephalus. The basilar cisterns are patent. No evidence of territorial infarct or acute ischemia. No extra-axial or intracranial fluid collection. Vascular: No hyperdense vessel or unexpected calcification. Skull: No fracture or focal lesion. Sinuses/Orbits: Paranasal sinuses and mastoid air cells are clear. The visualized orbits are unremarkable. Other: None. IMPRESSION: 1.  No acute intracranial abnormality. 2. Mild atrophy is unchanged from prior. Electronically Signed   By: Jeb Levering M.D.   On: 12/15/2017 23:57   Dg Chest Port 1 View  Result Date: 12/16/2017 CLINICAL DATA:  Confusion/altered mental status EXAM: PORTABLE CHEST 1 VIEW COMPARISON:  December 02, 2017 FINDINGS: There is no edema or consolidation. Heart is mildly enlarged with pulmonary vascularity within normal limits. No adenopathy. No bone lesions. No pneumothorax. IMPRESSION: Stable cardiac prominence.  No edema or consolidation. Electronically Signed   By: Lowella Grip III M.D.   On: 12/16/2017 00:00    EKG: Orders placed or performed during the hospital encounter of 12/15/17  . ED EKG  . ED EKG  . ED EKG  . ED EKG  . EKG 12-Lead  . EKG 12-Lead    IMPRESSION AND PLAN: 1 acute recurrent hepatic encephalopathy syndrome Secondary to cirrhosis of the liver Admit to regular nursing for bed, continue lactulose, neurochecks per routine, aspiration/fall/skin care precautions, gentle IV fluids for rehydration  2 acute bilateral lower extremity cellulitis compounded by chronic Bilateral lower extremity venous  Insufficiency Cellulitis protocol with vancomycin/meropenem for 5-7-day course, follow-up on cultures, wound clinic nurse consulted for expert opinion  3 acute  on chronic worsening anemia most likely secondary to Gastric AVMs and varices avoid Antiplatelet/anticoagulants/NSAID medications, transfuse 1 unit packed red blood cells, H&H every 8 hours, CBC daily, and transfuse as needed  4 chronic extreme morbid obesity Most likely secondary to excess calories Lifestyle modification recommended  5 chronic diabetes mellitus type 2 Sliding scale insulin with Accu-Cheks per routine, hemoglobin A1c to determine level of control  6 chronic cirrhosis of the liver Continue Lasix, spironolactone, Xifaxan  7 chronic GERD without esophagitis PPI daily   All the records are reviewed and case discussed with ED provider. Management plans discussed with the patient, family and they are in agreement.  CODE STATUS:full Code Status History    Date Active Date Inactive Code Status Order ID Comments User Context   12/02/2017 20:03 12/05/2017 20:00 Full Code [id number]  Saundra Shelling, MD Inpatient   11/21/2017 19:48 11/23/2017 19:58 Full Code [id number]  Loletha Grayer, MD ED   10/23/2017 14:05 10/26/2017 14:02 Full Code [id number]  Bettey Costa, MD Inpatient   10/18/2017 00:58 10/19/2017 18:06 Full Code [id number]  Saundra Shelling, MD Inpatient   10/09/2017 21:39 10/11/2017 18:06 Full Code [id number]  [name], MD Inpatient   09/03/2017 21:24 09/05/2017 15:52 Full Code [id number]  Nicholes Mango, MD Inpatient   06/26/2017 11:19 06/28/2017 17:48 Full Code [id number]  Henreitta Leber, MD Inpatient   02/21/2017 20:07 02/22/2017 18:03 Full Code [id number]  [name], MD Inpatient   07/20/2015 12:45 07/22/2015 22:17 Full Code [id number]  Nicholes Mango, MD Inpatient   02/08/2015 23:31 02/11/2015 19:43  Full Code [id number]  Hower, Aaron Mose, MD ED       TOTAL TIME TAKING CARE OF THIS PATIENT: 45 minutes.    Avel Peace Exa Bomba M.D on 12/16/2017   Between 7am to 6pm - Pager - [phone]  After 6pm go to [email or web address] - password [email or web address] [facility]   Office  [phone]  CC: Primary care physician; Baxter Hire, MD   Note: This dictation was prepared with Dragon dictation along with smaller phrase technology. Any transcriptional errors that result from this process are unintentional.

## 2017-12-16 NOTE — Progress Notes (Signed)
Chaplain was referred by charge as possible palliative care. Pt was confused. Chaplain offered assistance. Help Pt receive phone call and gave silent prayer for the Pt. Chaplain made himself available if needed.    12/16/17 1000  Clinical Encounter Type  Visited With Patient  Visit Type Initial  Referral From Nurse  Spiritual Encounters  Spiritual Needs Prayer

## 2017-12-16 NOTE — Progress Notes (Signed)
Inpatient Diabetes Program Recommendations  AACE/ADA: New Consensus Statement on Inpatient Glycemic Control (2015)  Target Ranges:  Prepandial:   less than 140 mg/dL      Peak postprandial:   less than 180 mg/dL (1-2 hours)      Critically ill patients:  140 - 180 mg/dL   Lab Results  Component Value Date   GLUCAP 200 (H) 12/16/2017   HGBA1C 7.0 (H) 09/04/2017    Review of Glycemic ControlResults for REFAEL, FULOP (MRN [id number]) as of 12/16/2017 14:10  Ref. Range 12/16/2017 01:57 12/16/2017 11:27  Glucose-Capillary Latest Ref Range: 65 - 99 mg/dL 227 (H) 200 (H)    Diabetes history: Type 2 DM Outpatient Diabetes medications: Novolog mix 70/30- 24 units bid  Current orders for Inpatient glycemic control:  Novolog mix 70/30 24 units bid, Novolog resistant tid with meals and HS  Inpatient Diabetes Program Recommendations:   Note that patient is NPO.  Please consider d/c of Novolog 70/30 since patient is NPO.  Consider changing Novolog correction to moderate every 4 hours. Also may consider adding Lantus 10 units daily.  Text page sent to MD. Will follow.   Thanks,  Adah Perl, RN, BC-ADM Inpatient Diabetes Coordinator Pager [phone] (8a-5p)

## 2017-12-16 NOTE — Progress Notes (Addendum)
Discussed clarification of blood orders. First unit of blood transfusing at this time. Md stated the other two units were on hold if needed. H&H Q8H.

## 2017-12-16 NOTE — Progress Notes (Signed)
Same day rounding progress note  67 y.o.malewell known to me with hx of chronic GI bleed, obesity, cirrhosis, cellulitis of lower legs found by neighbor on the floor-possibly on the floor for 24 hours, EMS found the patient confused and disoriented, per records-patient was recently at [facility] as well as [place] for elevated ammonia level and lower extremity swelling, patient is admitted for acute recurrent hepatic encephalopathy, bilateral lower extremity cellulitis, and acute on chronic anemia.  1 acute recurrent hepatic encephalopathy syndrome Secondary to cirrhosis of the liver - continue lactulose, neurochecks per routine, aspiration/fall/skin care precautions, continue IV fluids for rehydration  2 acute bilateral lower extremity cellulitis compounded by chronic Bilateral lower extremity venous  Insufficiency - Keflex for 5-7-day course, follow-up on cultures, wound clinic changed the dressing and input appreciated  3 acute on chronic worsening anemia most likely secondary to Gastric AVMs and varices - per Niece patient had capsule endoscopy which shows 3 areas of bleed and has follow up on April 3 - Avoid Antiplatelet/anticoagulants/NSAID medications, s/p transfusion 1 unit packed red blood cells, H&H every 8 hours, CBC daily, and transfuse as needed  4 chronic extreme morbid obesity Most likely secondary to excess calories Lifestyle modification recommended  5 chronic diabetes mellitus type 2 Sliding scale insulin with Accu-Cheks per routine  6 chronic cirrhosis of the liver Continue Lasix, spironolactone, Xifaxan  7 chronic GERD without esophagitis PPI daily  Time spent: 20 mins

## 2017-12-16 NOTE — Progress Notes (Signed)
Pt declined CPAP at this time.  

## 2017-12-16 NOTE — Progress Notes (Signed)
Pharmacy Antibiotic Note  Corey West is a 67 y.o. male admitted on 12/15/2017 with AMS s/t hyperammonemia w/ bilateral LE edema/cellulitis.  Pharmacy has been consulted for vanc/meropenem dosing.  Plan: Patient received vanc 1g and meropenem 1g IV x 1 in ED  Will dose patient w/ vanc 1g IV q12h w/ 6 hour stack dose. Will draw vanc trough 03/19 @ 2000 prior to 4th dose. Will start meropenem 1g IV q12h per CrCl < 50 ml/min  Ke 0.0422 T1/2 16 ~ 12 hrs and decreasing dose to 1g q12h (regular dose per AdBW: 1.5g) Goal trough 15 - 20 mcg/mL  Height: 5\' 9"  (175.3 cm) Weight: (!) 317 lb (143.8 kg) IBW/kg (Calculated) : 70.7  Temp (24hrs), Avg:98.4 F (36.9 C), Min:98.4 F (36.9 C), Max:98.4 F (36.9 C)  Recent Labs  Lab 12/15/17 2316  WBC 6.0  CREATININE 2.28*  LATICACIDVEN 3.9*    Estimated Creatinine Clearance: 45.6 mL/min (A) (by C-G formula based on SCr of 2.28 mg/dL (H)).    No Known Allergies  Thank you for allowing pharmacy to be a part of this patient's care.  Tobie Lords, PharmD, BCPS Clinical Pharmacist 12/16/2017

## 2017-12-16 NOTE — ED Notes (Signed)
Patient taken to 1A-139 by this RN

## 2017-12-16 NOTE — Consult Note (Signed)
[place] Nurse wound consult note Reason for Consult: Reusable compression to bilateral lower legs Wound type:  NO wounds were found to either lower leg Patient had a two layer compression wrap to bilateral lower legs that the patient was unsure of when they were applied; these were removed.    NO wounds were found to either lower leg; however, there was bright erythema to both lower legs extending to the knees bilaterally.  This is highly suggestive of cellulitis.  Dr. Manuella Ghazi was present during my assessment.  It was agreed that a daily cleansing, application of a protective cream, and application of dry kerlex and ACE wraps was appropriate in consideration of the cellulitis like symptoms that are present.  I washed his feet and legs with warm soapy water, applied protective cream, then kerlex, and an ACE wrap to the knees.  This is the care instruction for the bedside RNs that I have entered.  His lower legs were elevated on pillows.   Thank you for the consult.  Discussed plan of care with the patient and bedside nurse.  [place] nurse will not follow at this time.  Please re-consult the [place] team if needed.  Val Riles, RN, MSN, CWOCN, CNS-BC, pager [phone]

## 2017-12-16 NOTE — ED Notes (Signed)
BS 227

## 2017-12-16 NOTE — Progress Notes (Addendum)
Pharmacy Antibiotic Note  Corey West is a 67 y.o. male admitted on 12/15/2017 with AMS s/t hyperammonemia w/ bilateral LE edema/cellulitis.  Pharmacy has been consulted for vanc/meropenem dosing.  Plan: Ke 0.0422   T1/2 16  Vd: 72  Dosing weight: 104kg  Patient received vanc 1g and meropenem 1g IV x 1 in ED And 1gm vancomycin at 0830 on 3/18. Goal trough 15 - 20 mcg/mL Will change regimen to Vancomycin 1750mg  IV every 24 hours, starting 12 hours after last dose. Vanc trough ordered prior to 3rd dose. Calculated trough at Css is 16.  Continue  meropenem 1g IV q12h per CrCl < 50 ml/min   Height: 5\' 9"  (175.3 cm) Weight: (!) 344 lb (156 kg) IBW/kg (Calculated) : 70.7  Temp (24hrs), Avg:98.1 F (36.7 C), Min:97.6 F (36.4 C), Max:98.4 F (36.9 C)  Recent Labs  Lab 12/15/17 2316  WBC 6.0  CREATININE 2.28*  LATICACIDVEN 3.9*    Estimated Creatinine Clearance: 47.9 mL/min (A) (by C-G formula based on SCr of 2.28 mg/dL (H)).    No Known Allergies  Thank you for allowing pharmacy to be a part of this patient's care.  Pernell Dupre, PharmD, BCPS Clinical Pharmacist 12/16/2017 8:37 AM

## 2017-12-16 NOTE — Progress Notes (Signed)
Family Meeting Note  Advance Directive:yes  Today a meeting took place with the Patient.   The following clinical team members were present during this meeting:MD  The following were discussed:Patient's diagnosis:   67 y.o. male well known to me with hx of chronic GI bleed, obesity, cirrhosis, cellulitis of lower legs found by neighbor on the floor-possibly on the floor for 24 hours, EMS found the patient confused and disoriented, per records-patient was recently at [facility] as well as [place] for elevated ammonia level and lower extremity swelling, patient is admitted for acute recurrent hepatic encephalopathy, bilateral lower extremity cellulitis, and acute on chronic anemia.  Patient's progosis: > 12 months and Goals for treatment: DNR  Additional follow-up to be provided: Palliative care c/s - Home with HHPT, Palliative care  Time spent during discussion:20 minutes  Corey Sane, MD

## 2017-12-17 ENCOUNTER — Other Ambulatory Visit: Payer: Self-pay

## 2017-12-17 DIAGNOSIS — K7460 Unspecified cirrhosis of liver: Secondary | ICD-10-CM

## 2017-12-17 DIAGNOSIS — D649 Anemia, unspecified: Secondary | ICD-10-CM

## 2017-12-17 DIAGNOSIS — K7290 Hepatic failure, unspecified without coma: Principal | ICD-10-CM

## 2017-12-17 DIAGNOSIS — Z7189 Other specified counseling: Secondary | ICD-10-CM

## 2017-12-17 DIAGNOSIS — Z515 Encounter for palliative care: Secondary | ICD-10-CM

## 2017-12-17 LAB — COMPREHENSIVE METABOLIC PANEL
ALK PHOS: 64 U/L (ref 38–126)
ALT: 18 U/L (ref 17–63)
ANION GAP: 8 (ref 5–15)
AST: 38 U/L (ref 15–41)
Albumin: 2.5 g/dL — ABNORMAL LOW (ref 3.5–5.0)
BILIRUBIN TOTAL: 1 mg/dL (ref 0.3–1.2)
BUN: 25 mg/dL — ABNORMAL HIGH (ref 6–20)
CALCIUM: 8.8 mg/dL — AB (ref 8.9–10.3)
CO2: 23 mmol/L (ref 22–32)
Chloride: 107 mmol/L (ref 101–111)
Creatinine, Ser: 2.14 mg/dL — ABNORMAL HIGH (ref 0.61–1.24)
GFR calc non Af Amer: 31 mL/min — ABNORMAL LOW (ref >60)
GFR, EST AFRICAN AMERICAN: 36 mL/min — AB (ref >60)
Glucose, Bld: 156 mg/dL — ABNORMAL HIGH (ref 65–99)
POTASSIUM: 3.4 mmol/L — AB (ref 3.5–5.1)
SODIUM: 138 mmol/L (ref 135–145)
Total Protein: 6.7 g/dL (ref 6.5–8.1)

## 2017-12-17 LAB — CBC
HEMATOCRIT: 22.2 % — AB (ref 40.0–52.0)
Hemoglobin: 7.3 g/dL — ABNORMAL LOW (ref 13.0–18.0)
MCH: 30.2 pg (ref 26.0–34.0)
MCHC: 32.7 g/dL (ref 32.0–36.0)
MCV: 92.5 fL (ref 80.0–100.0)
Platelets: 105 10*3/uL — ABNORMAL LOW (ref 150–440)
RBC: 2.4 MIL/uL — ABNORMAL LOW (ref 4.40–5.90)
RDW: 16.8 % — AB (ref 11.5–14.5)
WBC: 5.1 10*3/uL (ref 3.8–10.6)

## 2017-12-17 LAB — GLUCOSE, CAPILLARY
GLUCOSE-CAPILLARY: 159 mg/dL — AB (ref 65–99)
GLUCOSE-CAPILLARY: 269 mg/dL — AB (ref 65–99)
GLUCOSE-CAPILLARY: 215 mg/dL — AB (ref 65–99)
Glucose-Capillary: 157 mg/dL — ABNORMAL HIGH (ref 65–99)
Glucose-Capillary: 183 mg/dL — ABNORMAL HIGH (ref 65–99)

## 2017-12-17 LAB — AMMONIA: Ammonia: 42 umol/L — ABNORMAL HIGH (ref 9–35)

## 2017-12-17 MED ORDER — LACTULOSE 10 GM/15ML PO SOLN
30.0000 g | Freq: Every day | ORAL | Status: DC
  Filled 2017-12-17 (×2): qty 60

## 2017-12-17 MED ORDER — INSULIN ASPART 100 UNIT/ML ~~LOC~~ SOLN
0.0000 [IU] | Freq: Three times a day (TID) | SUBCUTANEOUS | Status: DC
  Administered 2017-12-17 – 2017-12-18 (×2): 3 [IU] via SUBCUTANEOUS
  Administered 2017-12-18: 2 [IU] via SUBCUTANEOUS
  Filled 2017-12-17 (×3): qty 1

## 2017-12-17 NOTE — Care Management Note (Signed)
Case Management Note  Patient Details  Name: Corey West MRN: [id number] Date of Birth: Jun 25, 1951  Subjective/Objective:  Case dicussed with Dr. Manuella Ghazi. He requested patient be in [facility] program. Spoke with Dr. Doy Hutching who approved [name]. Patient active with [facility]. Explained benefits of [place] program. And reduction of hospitalizations and increased services. He is agreeable to switch programs. Notified Malachy Mood with [facility] who will close patient. Referral to [facility] with [facility] ([place]) for RN, PT, SW and HHA. Patient lives alone and uses a cane. PCP is [name].                 Action/Plan: [facility] for [place] program.   Expected Discharge Date:  12/18/17               Expected Discharge Plan:  [place]  In-House Referral:     Discharge planning Services     Post Acute Care Choice:  Home Health Choice offered to:  Patient  DME Arranged:    DME Agency:     HH Arranged:  RN, PT, Nurse's Aide, Social Work [employer] Agency:  [place]  Status of Service:  In process, will continue to follow  If discussed at Long Length of Stay Meetings, dates discussed:    Additional Comments:  Jolly Mango, RN 12/17/2017, 2:36 PM

## 2017-12-17 NOTE — Plan of Care (Signed)
Patient continues with AMS which is contributing to no progress with several care plan goals.  Education: Knowledge of General Education information will improve 12/17/2017 0436 - Not Progressing by Gerhard Perches, RN   Clinical Measurements: Ability to maintain clinical measurements within normal limits will improve 12/17/2017 0436 - Not Progressing by Gerhard Perches, RN   Health Behavior/Discharge Planning: Ability to manage health-related needs will improve 12/17/2017 0436 - Not Progressing by Gerhard Perches, RN   Clinical Measurements: Ability to avoid or minimize complications of infection will improve 12/17/2017 0436 - Not Progressing by Gerhard Perches, RN

## 2017-12-17 NOTE — Consult Note (Addendum)
Consultation Note Date: 12/17/2017   Patient Name: Corey West  DOB: 26-Dec-1950  MRN: [id number]  Age / Sex: 67 y.o., male  PCP: Baxter Hire, MD Referring Physician: Max Sane, MD  Reason for Consultation: Establishing goals of care  HPI/Patient Profile: 67 y.o. male  with past medical history of liver cirrhosis, anemia, Barrett's esophagus, CKD, DM2, HTN, sleep apnea (no CPAP), BLE venous stasis admitted on 12/15/2017 with recurrent hepatic encephalopathy, bilateral cellulitis with bilateral lower extremity venous insufficiency, ongoing anemia (h/o gastric AVMs, varices, and iron deficiency).   Clinical Assessment and Goals of Care: I met today with Corey West and no family at bedside. He seems much more oriented today. He has had multiple admissions for hepatic encephalopathy. 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 He is very invested in his relationship with Dr. Tiffany Kocher saying "he has kept me alive for over 10 years now." I agreed that he seems to be in very good hands. His main goal is to return to his home asap (he lives there alone) in [place] and to continue to work part-time. He also has been referred to [facility] at Dr. Edd Fabian recommendation and has an upcoming appointment.   I broached the subject of his worsening liver disease and what his main concerns are with his worsening health. He is very friendly but tells me that he is worried and scared but does not want to talk about this. I honored his request and then asked if he has been having any issues managing at home that maybe we could help with or anything we could do for him to make this easier on him. He again tells me that he does not wish to talk. I explained that I respected his decision and wish him the best. I gave him my contact info and instructed him to call if he can  think of any ways I can help support him.   I will not follow up as Corey West was clearly very uncomfortable discussing his liver disease. Conversations would be best to come from Dr. Tiffany Kocher with whom he has a good and trusting relationship.    Primary Decision Maker PATIENT    SUMMARY OF RECOMMENDATIONS   - Not interested or ready for palliative discussion at this time  Code Status/[facility] Care Planning:  DNR - not discussed   Symptom Management:   Per primary and GI.   Palliative Prophylaxis:   Bowel Regimen and Frequent Pain Assessment  Additional Recommendations (Limitations, Scope, Preferences):  Full Scope Treatment  Psycho-social/Spiritual:   Desire for further Chaplaincy support:no  Additional Recommendations: Caregiving  Support/Resources  Prognosis:   Very poor with worsening liver cirrhosis.   Discharge Planning: Home with Home Health      Primary Diagnoses: Present on Admission: . Hepatic encephalopathy syndrome ([place])   I have reviewed the medical record, interviewed the patient and family, and examined the patient. The following aspects are pertinent.  Past Medical  History:  Diagnosis Date  . Anemia   . Anxiety    controlled;   . Arthritis   . AVM (arteriovenous malformation) of stomach, acquired with hemorrhage   . Barrett's esophagus   . Chronic kidney disease    renal infufficiency  . Cirrhosis ([place])   . Depression    controlled;   Marland Kitchen Diabetes mellitus without complication ([place])    not controlled, taking insulin but sugar continues to run high;   . Edema   . Esophageal varices ([place])   . GERD (gastroesophageal reflux disease)   . History of hiatal hernia   . Hyperlipidemia   . Hypertension    controlled well;   . Nephropathy, diabetic ([place])   . Obesity   . Pancytopenia ([place])   . Polyp, stomach    with chronic blood loss  . Sleep apnea    does not wear a cpap, Medicare would not pay for it   . Venous stasis dermatitis of both  lower extremities   . Venous stasis of both lower extremities    with cellulitis   Social History   Socioeconomic History  . Marital status: Single    Spouse name: None  . Number of children: None  . Years of education: None  . Highest education level: None  Social Needs  . Financial resource strain: Not hard at all  . Food insecurity - worry: Never true  . Food insecurity - inability: Never true  . Transportation needs - medical: No  . Transportation needs - non-medical: No  Occupational History  . Occupation: disabled  Tobacco Use  . Smoking status: Current Some Day Smoker    Types: Cigars  . Smokeless tobacco: Never Used  . Tobacco comment: smoke 1 at night occasionally;   Substance and Sexual Activity  . Alcohol use: No    Comment: stopped 12 years ago  . Drug use: No  . Sexual activity: No  Other Topics Concern  . None  Social History Narrative   Lives alone   Family History  Problem Relation Age of Onset  . Diabetes Other   . Transient ischemic attack Father   . CAD Father    Scheduled Meds: . cephALEXin  500 mg Oral Q8H  . ferrous sulfate  325 mg Oral BID  . furosemide  20 mg Intravenous Once  . furosemide  40 mg Oral BID  . insulin aspart  0-15 Units Subcutaneous Q4H  . insulin glargine  10 Units Subcutaneous Daily  . lactulose  30 g Oral 6 X Daily  . magnesium oxide  400 mg Oral q morning - 10a  . multivitamin with minerals  1 tablet Oral Daily  . pantoprazole  40 mg Oral Daily  . rifaximin  550 mg Oral BID  . spironolactone  25 mg Oral BID   Continuous Infusions: . sodium chloride    . lactated ringers 50 mL/hr at 12/16/17 2058   PRN Meds:.ondansetron **OR** ondansetron (ZOFRAN) IV No Known Allergies Review of Systems  Constitutional: Positive for activity change and fatigue.  Respiratory: Positive for shortness of breath.   Neurological: Positive for weakness.    Physical Exam  Constitutional: He is oriented to person, place, and time. He  appears well-developed. He appears ill.  HENT:  Head: Normocephalic and atraumatic.  Cardiovascular: Normal rate and regular rhythm.  Pulmonary/Chest: No accessory muscle usage. No tachypnea. He is in respiratory distress.  Mild SOB with conversation  Abdominal: Soft. Normal appearance.  Neurological: He is  alert and oriented to person, place, and time.  Nursing note and vitals reviewed.   Vital Signs: BP 139/78 (BP Location: Right Arm)   Pulse 79   Temp 97.9 F (36.6 C) (Oral)   Resp 20   Ht '5\' 9"'$  (1.753 m)   Wt (!) 156 kg (344 lb)   SpO2 100%   BMI 50.80 kg/m  Pain Assessment: No/denies pain POSS *See Group Information*: 1-Acceptable,Awake and alert Pain Score: 0-No pain   SpO2: SpO2: 100 % O2 Device:SpO2: 100 % O2 Flow Rate: .   IO: Intake/output summary:   Intake/Output Summary (Last 24 hours) at 12/17/2017 1054 Last data filed at 12/17/2017 0900 Gross per 24 hour  Intake 1461.67 ml  Output -  Net 1461.67 ml    LBM: Last BM Date: 12/17/17 Baseline Weight: Weight: (!) 143.8 kg (317 lb) Most recent weight: Weight: (!) 156 kg (344 lb)     Palliative Assessment/Data: 50%     Time Total: 40 min Greater than 50%  of this time was spent counseling and coordinating care related to the above assessment and plan.  Signed by: Vinie Sill, NP Palliative Medicine Team Pager # [phone] (M-F 8a-5p) Team Phone # [phone] (Nights/Weekends)

## 2017-12-17 NOTE — Plan of Care (Signed)
Pt had multiple BM's today. Several were with blood, MD aware.

## 2017-12-17 NOTE — Consult Note (Signed)
Corey West , MD [address] [place], [place], [place], [address] 3940 [address], [place], [place], [place], [address] Phone: [phone]  Fax: [phone]  Consultation  Referring Provider: Dr Manuella Ghazi  Primary Care Physician:  Baxter Hire, MD Primary Gastroenterologist:  Dr. Tiffany Kocher          Reason for Consultation:     GI bleed  Date of Admission:  12/15/2017 Date of Consultation:  12/17/2017         HPI:   Corey West is a 67 y.o. male known to Dr Tiffany Kocher . Last office note 11/2017 " Patient discharged from hospital recently after several units of blood due to hgb of 5-6 range. His last level was 9. His color looks good. He feels better.Exam shows chest with good air flow, no wheezes or ronchi.Heart rate 84 T 96.2 BP 153/70 Weight 322lbs.Plan to repeat his EGD with cauterization of any AVM splotches, Do this in 9-10 days. "  Recent EGD 12/04/17 by Dr Tiffany Kocher and APC performed on gastric lesions . Hb stable on this admission at 7.3 grams which it was similarly 5 days back.H/o barrettes esophagus and multiple ablations of gastric AVM's . Carries a diagnosis of liver cirrhosis. No mention of etiology per last office notes . Possible fatty liver.   This admission he was admitted for encephelopathy as he was found confused. CT scan of the head, CXR was normal . Noted to have significant cellulitis.   He says he has had black stools for a very long time, takes iron daily. Denies any abdominal pains. Says he has a procedure scheduled at [facility] to cauterize the bleeding spots on April 3rd.   Past Medical History:  Diagnosis Date  . Anemia   . Anxiety    controlled;   . Arthritis   . AVM (arteriovenous malformation) of stomach, acquired with hemorrhage   . Barrett's esophagus   . Chronic kidney disease    renal infufficiency  . Cirrhosis ([place])   . Depression    controlled;   Marland Kitchen Diabetes mellitus without complication ([place])    not controlled, taking insulin but sugar continues to run  high;   . Edema   . Esophageal varices ([place])   . GERD (gastroesophageal reflux disease)   . History of hiatal hernia   . Hyperlipidemia   . Hypertension    controlled well;   . Nephropathy, diabetic ([place])   . Obesity   . Pancytopenia ([place])   . Polyp, stomach    with chronic blood loss  . Sleep apnea    does not wear a cpap, Medicare would not pay for it   . Venous stasis dermatitis of both lower extremities   . Venous stasis of both lower extremities    with cellulitis    Past Surgical History:  Procedure Laterality Date  . ESOPHAGOGASTRODUODENOSCOPY N/A 02/09/2015   Procedure: ESOPHAGOGASTRODUODENOSCOPY (EGD);  Surgeon: Manya Silvas, MD;  Location: [facility] ENDOSCOPY;  Service: Endoscopy;  Laterality: N/A;  . ESOPHAGOGASTRODUODENOSCOPY N/A 07/22/2015   Procedure: ESOPHAGOGASTRODUODENOSCOPY (EGD);  Surgeon: Manya Silvas, MD;  Location: [facility] ENDOSCOPY;  Service: Endoscopy;  Laterality: N/A;  . ESOPHAGOGASTRODUODENOSCOPY N/A 06/26/2017   Procedure: ESOPHAGOGASTRODUODENOSCOPY (EGD);  Surgeon: Manya Silvas, MD;  Location: [facility] ENDOSCOPY;  Service: Endoscopy;  Laterality: N/A;  . ESOPHAGOGASTRODUODENOSCOPY N/A 06/27/2017   Procedure: ESOPHAGOGASTRODUODENOSCOPY (EGD);  Surgeon: Manya Silvas, MD;  Location: [facility] ENDOSCOPY;  Service: Endoscopy;  Laterality: N/A;  . ESOPHAGOGASTRODUODENOSCOPY N/A 06/28/2017  Procedure: ESOPHAGOGASTRODUODENOSCOPY (EGD);  Surgeon: Manya Silvas, MD;  Location: [facility] ENDOSCOPY;  Service: Endoscopy;  Laterality: N/A;  . ESOPHAGOGASTRODUODENOSCOPY Bilateral 10/11/2017   Procedure: ESOPHAGOGASTRODUODENOSCOPY (EGD);  Surgeon: Manya Silvas, MD;  Location: [facility] ENDOSCOPY;  Service: Endoscopy;  Laterality: Bilateral;  . ESOPHAGOGASTRODUODENOSCOPY N/A 12/04/2017   Procedure: ESOPHAGOGASTRODUODENOSCOPY (EGD);  Surgeon: Manya Silvas, MD;  Location: [facility] ENDOSCOPY;  Service: Endoscopy;  Laterality: N/A;  . ESOPHAGOGASTRODUODENOSCOPY (EGD) WITH  PROPOFOL N/A 07/20/2015   Procedure: ESOPHAGOGASTRODUODENOSCOPY (EGD) WITH PROPOFOL;  Surgeon: Manya Silvas, MD;  Location: [facility] ENDOSCOPY;  Service: Endoscopy;  Laterality: N/A;  . ESOPHAGOGASTRODUODENOSCOPY (EGD) WITH PROPOFOL N/A 09/16/2015   Procedure: ESOPHAGOGASTRODUODENOSCOPY (EGD) WITH PROPOFOL;  Surgeon: Manya Silvas, MD;  Location: [facility] ENDOSCOPY;  Service: Endoscopy;  Laterality: N/A;  . ESOPHAGOGASTRODUODENOSCOPY (EGD) WITH PROPOFOL N/A 03/16/2016   Procedure: ESOPHAGOGASTRODUODENOSCOPY (EGD) WITH PROPOFOL;  Surgeon: Manya Silvas, MD;  Location: [facility] ENDOSCOPY;  Service: Endoscopy;  Laterality: N/A;  . ESOPHAGOGASTRODUODENOSCOPY (EGD) WITH PROPOFOL N/A 09/14/2016   Procedure: ESOPHAGOGASTRODUODENOSCOPY (EGD) WITH PROPOFOL;  Surgeon: Manya Silvas, MD;  Location: [facility] ENDOSCOPY;  Service: Endoscopy;  Laterality: N/A;  . ESOPHAGOGASTRODUODENOSCOPY (EGD) WITH PROPOFOL N/A 11/05/2016   Procedure: ESOPHAGOGASTRODUODENOSCOPY (EGD) WITH PROPOFOL;  Surgeon: Manya Silvas, MD;  Location: [facility] ENDOSCOPY;  Service: Endoscopy;  Laterality: N/A;  . ESOPHAGOGASTRODUODENOSCOPY (EGD) WITH PROPOFOL N/A 02/06/2017   Procedure: ESOPHAGOGASTRODUODENOSCOPY (EGD) WITH PROPOFOL;  Surgeon: Manya Silvas, MD;  Location: [facility] ENDOSCOPY;  Service: Endoscopy;  Laterality: N/A;  . ESOPHAGOGASTRODUODENOSCOPY (EGD) WITH PROPOFOL N/A 04/24/2017   Procedure: ESOPHAGOGASTRODUODENOSCOPY (EGD) WITH PROPOFOL;  Surgeon: Manya Silvas, MD;  Location: [facility] ENDOSCOPY;  Service: Endoscopy;  Laterality: N/A;  . ESOPHAGOGASTRODUODENOSCOPY (EGD) WITH PROPOFOL N/A 07/31/2017   Procedure: ESOPHAGOGASTRODUODENOSCOPY (EGD) WITH PROPOFOL;  Surgeon: Manya Silvas, MD;  Location: [facility] ENDOSCOPY;  Service: Endoscopy;  Laterality: N/A;  . GIVENS CAPSULE STUDY N/A 12/05/2017   Procedure: GIVENS CAPSULE STUDY;  Surgeon: Manya Silvas, MD;  Location: [facility] ENDOSCOPY;  Service: Endoscopy;  Laterality: N/A;  .  TONSILLECTOMY    . TONSILLECTOMY AND ADENOIDECTOMY    . ULNAR NERVE TRANSPOSITION    . UVULOPALATOPHARYNGOPLASTY      Prior to Admission medications   Medication Sig Start Date End Date Taking? Authorizing Provider  FERROUS SULFATE ER PO Take 325 mg by mouth 2 (two) times daily.     [provider]  furosemide (LASIX) 40 MG tablet Take 40 mg by mouth 2 (two) times daily.    [provider]  insulin aspart protamine- aspart (NOVOLOG MIX 70/30) (70-30) 100 UNIT/ML injection Inject 0.24-0.84 mLs (24-84 Units total) into the skin 2 (two) times daily with a meal. 12/05/17   Leslye Peer, Richard, MD  lactulose (CHRONULAC) 10 GM/15ML solution Take 30 g by mouth 6 (six) times daily.     [provider]  LORazepam (ATIVAN) 0.5 MG tablet Take 0.5 mg by mouth 3 (three) times daily as needed for anxiety.     [provider]  Magnesium Oxide 400 (240 Mg) MG TABS Take 1 tablet (400 mg total) by mouth every morning. 12/05/17   Loletha Grayer, MD  Multiple Vitamin (MULTIVITAMIN) tablet Take 1 tablet by mouth daily.    [provider]  pantoprazole (PROTONIX) 40 MG tablet Take 1 tablet (40 mg total) by mouth daily. 12/05/17 12/05/18  Loletha Grayer, MD  potassium chloride SA (K-DUR,KLOR-CON) 20 MEQ tablet Take 2 tablets (40 mEq total) by mouth 2 (two) times daily. 12/05/17  Loletha Grayer, MD  rifaximin (XIFAXAN) 550 MG TABS tablet Take 550 mg by mouth 2 (two) times daily.    [provider]  spironolactone (ALDACTONE) 25 MG tablet Take 1 tablet (25 mg total) by mouth 2 (two) times daily. 12/05/17   Loletha Grayer, MD  traMADol (ULTRAM) 50 MG tablet Take 50 mg by mouth every 8 (eight) hours as needed.     [provider]    Family History  Problem Relation Age of Onset  . Diabetes Other   . Transient ischemic attack Father   . CAD Father      Social History   Tobacco Use  . Smoking status: Current Some Day Smoker    Types: Cigars  . Smokeless  tobacco: Never Used  . Tobacco comment: smoke 1 at night occasionally;   Substance Use Topics  . Alcohol use: No    Comment: stopped 12 years ago  . Drug use: No    Allergies as of 12/15/2017  . (No Known Allergies)    Review of Systems:    All systems reviewed and negative except where noted in HPI.   Physical Exam:  Vital signs in last 24 hours: Temp:  [97.8 F (36.6 C)-99.3 F (37.4 C)] 97.9 F (36.6 C) (03/19 0759) Pulse Rate:  [75-86] 79 (03/19 0759) Resp:  [18-20] 20 (03/19 0759) BP: (139-151)/(47-78) 139/78 (03/19 0759) SpO2:  [97 %-100 %] 100 % (03/19 0759) Last BM Date: 12/17/17 General:   Pleasant, cooperative in NAD Head:  Normocephalic and atraumatic. Eyes:   No icterus.   Conjunctiva pink. PERRLA. Ears:  Normal auditory acuity. Neck:  Supple; no masses or thyroidomegaly Lungs: Respirations even and unlabored. Lungs clear to auscultation bilaterally.   No wheezes, crackles, or rhonchi.  Heart:  Regular rate and rhythm;  Without murmur, clicks, rubs or gallops Abdomen:  Soft, nondistended, nontender. Normal bowel sounds. No appreciable masses or hepatomegaly.  No rebound or guarding.  Neurologic:  Alert and oriented x3;  grossly normal neurologically. Skin:  Intact without significant lesions or rashes. Cervical Nodes:  No significant cervical adenopathy. Psych:  Alert and cooperative. Normal affect.  LAB RESULTS: Recent Labs    12/15/17 2316  12/16/17 1111 12/16/17 1908 12/17/17 0251  WBC 6.0  --   --   --  5.1  HGB 7.2*   < > 7.3* 7.4* 7.3*  HCT 22.2*   < > 21.6* 21.7* 22.2*  PLT 114*  --   --   --  105*   < > = values in this interval not displayed.   BMET Recent Labs    12/15/17 2316 12/17/17 0251  NA 140 138  K 4.4 3.4*  CL 107 107  CO2 23 23  GLUCOSE 240* 156*  BUN 29* 25*  CREATININE 2.28* 2.14*  CALCIUM 8.9 8.8*   LFT Recent Labs    12/17/17 0251  PROT 6.7  ALBUMIN 2.5*  AST 38  ALT 18  ALKPHOS 64  BILITOT 1.0    PT/INR Recent Labs    12/15/17 2316  LABPROT 15.7*  INR 1.26    STUDIES: Ct Head Wo Contrast  Result Date: 12/15/2017 CLINICAL DATA:  Altered level of consciousness. EXAM: CT HEAD WITHOUT CONTRAST TECHNIQUE: Contiguous axial images were obtained from the base of the skull through the vertex without intravenous contrast. COMPARISON:  Head CT 10/17/2017 FINDINGS: Brain: Mild atrophy is unchanged from prior exam. No intracranial hemorrhage, mass effect, or midline shift. No hydrocephalus. The basilar cisterns are  patent. No evidence of territorial infarct or acute ischemia. No extra-axial or intracranial fluid collection. Vascular: No hyperdense vessel or unexpected calcification. Skull: No fracture or focal lesion. Sinuses/Orbits: Paranasal sinuses and mastoid air cells are clear. The visualized orbits are unremarkable. Other: None. IMPRESSION: 1.  No acute intracranial abnormality. 2. Mild atrophy is unchanged from prior. Electronically Signed   By: Jeb Levering M.D.   On: 12/15/2017 23:57   Dg Chest Port 1 View  Result Date: 12/16/2017 CLINICAL DATA:  Confusion/altered mental status EXAM: PORTABLE CHEST 1 VIEW COMPARISON:  December 02, 2017 FINDINGS: There is no edema or consolidation. Heart is mildly enlarged with pulmonary vascularity within normal limits. No adenopathy. No bone lesions. No pneumothorax. IMPRESSION: Stable cardiac prominence.  No edema or consolidation. Electronically Signed   By: Lowella Grip III M.D.   On: 12/16/2017 00:00      Impression / Plan:   Corey West is a 67 y.o. y/o male with h/o liver cirrhosis possibly due to NAFLD per last [facility] GI note, known to have chronic blood loss anemia , S/p APC of gastric AVM by Dr Tiffany Kocher on multiple occasions. An office note suggests small bowel bleeding and plan to refer to [facility] for small bowel evaluation. Admitted with acute hepatic encephelopathy and anemia which is at his baseline.    Plan  1. Hepatic  encephalopathy: presently not encephalopathic. On admission  likely secondary to cellulitis and AKI as precipitating factor. Hepatic encephalopathy is a clinical diagnosis and a blood ammonia level cannot be used to rule out or rule in the condition. Dehydration, infection, GI bleeds, electrolyte imbalances, medications are the most common precipitating factors. Avoid excess lactulose, titrate to two soft bowel movements. Excess lactulose can worsen dehydration and worsen encephalopathy. Continue Xifaxan.   2. Extensive evaluation by Dr Tiffany Kocher for GI bleeding and per last office note was to be referred to [facility] , per office note on 12/09/17 capsule study showed small intestine bleeding per last office note although I cannot find the actual capsule report on Epic.  Marland Kitchen Hb at recent baseline. Further evaluation per [facility] as an outpatient/ [facility] as scheduled. If has active bleeding and drop in Hb get tagged RBC scan and if there is active small bowel bleeding would either need vascular intervention or transfer to [facility] .      Thank you for involving me in the care of this patient.      LOS: 1 day   Corey Bellows, MD  12/17/2017, 10:01 AM

## 2017-12-17 NOTE — Progress Notes (Signed)
[facility] [place] NAME: Karsin Pesta    MR#:  [id number]  DATE OF BIRTH:  1951/06/16  SUBJECTIVE:  CHIEF COMPLAINT:   Chief Complaint  Patient presents with  . Altered Mental Status  wants to go home but seems confused and still having bloody BMs per nursing, talked with Niece and she doesn't fel comfortable either him coming home yet (as she also feels he's confused). Patient says he's going to [place] for his GI procedure (possible he just doesn't know), REVIEW OF SYSTEMS:  Review of Systems  Constitutional: Positive for malaise/fatigue. Negative for chills, fever and weight loss.  HENT: Negative for nosebleeds and sore throat.   Eyes: Negative for blurred vision.  Respiratory: Negative for cough, shortness of breath and wheezing.   Cardiovascular: Negative for chest pain, orthopnea, leg swelling and PND.  Gastrointestinal: Positive for blood in stool. Negative for abdominal pain, constipation, diarrhea, heartburn, nausea and vomiting.  Genitourinary: Negative for dysuria and urgency.  Musculoskeletal: Negative for back pain.  Skin: Negative for rash.  Neurological: Positive for weakness. Negative for dizziness, speech change, focal weakness and headaches.  Endo/Heme/Allergies: Does not bruise/bleed easily.  Psychiatric/Behavioral: Negative for depression.    DRUG ALLERGIES:  No Known Allergies VITALS:  Blood pressure (!) 154/54, pulse 79, temperature 98.7 F (37.1 C), temperature source Oral, resp. rate 19, height 5\' 9"  (1.753 m), weight (!) 156 kg (344 lb), SpO2 98 %. PHYSICAL EXAMINATION:  Physical Exam  Constitutional: He is oriented to person, place, and time and well-developed, well-nourished, and in no distress.  HENT:  Head: Normocephalic and atraumatic.  Eyes: Conjunctivae and EOM are normal. Pupils are equal, round, and reactive to light.  Neck: Normal range of motion. Neck supple. No tracheal deviation present. No  thyromegaly present.  Cardiovascular: Normal rate, regular rhythm and normal heart sounds.  Pulmonary/Chest: Effort normal and breath sounds normal. No respiratory distress. He has no wheezes. He exhibits no tenderness.  Abdominal: Soft. Bowel sounds are normal. He exhibits no distension. There is no tenderness.  Musculoskeletal: Normal range of motion.  Neurological: He is alert and oriented to person, place, and time. No cranial nerve deficit.  Skin: Skin is warm and dry. No rash noted.  Psychiatric: Mood and affect normal.   LABORATORY PANEL:  Male CBC Recent Labs  Lab 12/17/17 0251  WBC 5.1  HGB 7.3*  HCT 22.2*  PLT 105*   ------------------------------------------------------------------------------------------------------------------ Chemistries  Recent Labs  Lab 12/17/17 0251  NA 138  K 3.4*  CL 107  CO2 23  GLUCOSE 156*  BUN 25*  CREATININE 2.14*  CALCIUM 8.8*  AST 38  ALT 18  ALKPHOS 64  BILITOT 1.0   RADIOLOGY:  No results found. ASSESSMENT AND PLAN:  DEONDRAE MCGRAIL is a 67 y.o. y/o male with h/o liver cirrhosis possibly due to NAFLD known to have chronic blood loss anemia. He wasfound by neighbor on the floor-possibly on the floor for 24 hours, EMS found the patient confused and disoriented, per records-patient was recently at [facility] as well as [facility] for elevated ammonia level and lower extremity swelling, patient is admitted for acute recurrent hepatic encephalopathy, bilateral lower extremity cellulitis, and acute on chronic anemia.  *acute recurrent hepatic encephalopathy  - appreciate GI input presently not encephalopathic. Per GI, this is likely secondary to cellulitis and AKI as precipitating factor.  - cut back on lactulose to once daily (from QID), titrate to two soft  bowel movements.  Continue Xifaxan.   *acute bilateral lower extremity cellulitis compounded by chronic Bilateral lower extremity venous Insufficiency - Keflex for  5-7-day course, follow-up on cultures, wound clinic changed the dressing and input appreciated  *acute on chronic worsening anemia most likely secondary to - per Niece patient had capsule endoscopy which shows 3 areas of bleed and has follow up on April 3 at [place] Antiplatelet/anticoagulants/NSAID medications, s/p transfusion 1 unit packed red blood cells, H&H every 8 hours, CBC daily, and transfuse as needed - If has active bleeding and drop in Hb - he will need tagged RBC scan and if there is active small bowel bleeding would either need vascular intervention or transfer to [facility] .  - recheck CBC in am   *chronic extreme morbid obesity Most likely secondary to excess calories Lifestyle modification recommended  *chronic diabetes mellitus type 2 Sliding scale insulin with Accu-Cheks per routine  * chronic cirrhosis of the liver Continue Lasix, spironolactone, Xifaxan  * chronic GERD without esophagitis PPI daily      All the records are reviewed and case discussed with Care Management/Social Worker. Management plans discussed with the patient, family (niece over phone), Dr Vicente Males and they are in agreement.  CODE STATUS: DNR  TOTAL TIME TAKING CARE OF THIS PATIENT: 35 minutes.   More than 50% of the time was spent in counseling/coordination of care: YES  POSSIBLE D/C IN 1-2 DAYS, DEPENDING ON CLINICAL CONDITION.   Max Sane M.D on 12/17/2017 at 10:32 PM  Between 7am to 6pm - Pager - [phone]  After 6pm go to [email or web address] - Proofreader  [facility]  Office  [phone]  CC: Primary care physician; Baxter Hire, MD  Note: This dictation was prepared with Dragon dictation along with smaller phrase technology. Any transcriptional errors that result from this process are unintentional.

## 2017-12-18 LAB — AMMONIA: AMMONIA: 46 umol/L — AB (ref 9–35)

## 2017-12-18 LAB — CBC
HCT: 21.9 % — ABNORMAL LOW (ref 40.0–52.0)
Hemoglobin: 7.1 g/dL — ABNORMAL LOW (ref 13.0–18.0)
MCH: 30.2 pg (ref 26.0–34.0)
MCHC: 32.4 g/dL (ref 32.0–36.0)
MCV: 93.3 fL (ref 80.0–100.0)
PLATELETS: 99 10*3/uL — AB (ref 150–440)
RBC: 2.35 MIL/uL — ABNORMAL LOW (ref 4.40–5.90)
RDW: 16.6 % — AB (ref 11.5–14.5)
WBC: 5 10*3/uL (ref 3.8–10.6)

## 2017-12-18 LAB — COMPREHENSIVE METABOLIC PANEL
ALT: 20 U/L (ref 17–63)
AST: 33 U/L (ref 15–41)
Albumin: 2.4 g/dL — ABNORMAL LOW (ref 3.5–5.0)
Alkaline Phosphatase: 63 U/L (ref 38–126)
Anion gap: 9 (ref 5–15)
BUN: 22 mg/dL — ABNORMAL HIGH (ref 6–20)
CHLORIDE: 109 mmol/L (ref 101–111)
CO2: 22 mmol/L (ref 22–32)
Calcium: 8.9 mg/dL (ref 8.9–10.3)
Creatinine, Ser: 1.95 mg/dL — ABNORMAL HIGH (ref 0.61–1.24)
GFR, EST AFRICAN AMERICAN: 40 mL/min — AB (ref >60)
GFR, EST NON AFRICAN AMERICAN: 34 mL/min — AB (ref >60)
Glucose, Bld: 209 mg/dL — ABNORMAL HIGH (ref 65–99)
POTASSIUM: 3.6 mmol/L (ref 3.5–5.1)
SODIUM: 140 mmol/L (ref 135–145)
Total Bilirubin: 0.7 mg/dL (ref 0.3–1.2)
Total Protein: 6.4 g/dL — ABNORMAL LOW (ref 6.5–8.1)

## 2017-12-18 LAB — GLUCOSE, CAPILLARY
GLUCOSE-CAPILLARY: 160 mg/dL — AB (ref 65–99)
GLUCOSE-CAPILLARY: 146 mg/dL — AB (ref 65–99)

## 2017-12-18 MED ORDER — LORAZEPAM 2 MG/ML IJ SOLN
0.5000 mg | Freq: Once | INTRAMUSCULAR | Status: AC
  Administered 2017-12-18: 0.5 mg via INTRAVENOUS
  Filled 2017-12-18: qty 1

## 2017-12-18 MED ORDER — LACTULOSE 10 GM/15ML PO SOLN: 20.0000 g | Freq: Two times a day (BID) | ORAL | 0 refills | Status: DC

## 2017-12-18 MED ORDER — LORAZEPAM 0.5 MG PO TABS: 0.5000 mg | ORAL_TABLET | Freq: Three times a day (TID) | ORAL | Status: DC | PRN

## 2017-12-18 MED ORDER — CEPHALEXIN 500 MG PO CAPS: 500.0000 mg | ORAL_CAPSULE | Freq: Three times a day (TID) | ORAL | 0 refills | Status: DC

## 2017-12-18 MED ORDER — INSULIN ASPART PROT & ASPART (70-30 MIX) 100 UNIT/ML ~~LOC~~ SUSP: 20.0000 [IU] | Freq: Two times a day (BID) | SUBCUTANEOUS | 11 refills | Status: DC

## 2017-12-18 NOTE — Care Management (Signed)
Spoke with niece, Tresa Moore with an explanation of [place]. She is agreeable with POC. Patient to discharge home today. Advanced notified of discharge.

## 2017-12-18 NOTE — Progress Notes (Signed)
Rooom air. Ambulated. No pain. IV and tele removed. Discharge instructions given to nephew. No further concerns at this time. Home health set up per Lattie Haw CM

## 2017-12-18 NOTE — Care Management Note (Signed)
Case Management Note  Patient Details  Name: Corey West MRN: [id number] Date of Birth: [birth year]  Subjective/Objective:  Discharging today                  Action/Plan: Advanced notified of discharge Expected Discharge Date:  12/18/17               Expected Discharge Plan:  [place]  In-House Referral:     Discharge planning Services     Post Acute Care Choice:  Home Health Choice offered to:  Patient  DME Arranged:    DME Agency:     HH Arranged:  RN, PT, Nurse's Aide, Social Work [employer] Agency:  [place]  Status of Service:  Completed, signed off  If discussed at [employer], dates discussed:    Additional Comments:  Jolly Mango, RN 12/18/2017, 8:46 AM

## 2017-12-18 NOTE — Discharge Summary (Addendum)
[facility] [place] NAME: Corey West    MR#:  [id number]  DATE OF BIRTH:  [birth year]  DATE OF ADMISSION:  12/15/2017 ADMITTING PHYSICIAN: Gorden Harms, MD  DATE OF DISCHARGE: 12/18/2017  PRIMARY CARE PHYSICIAN: Baxter Hire, MD    ADMISSION DIAGNOSIS:  Hepatic encephalopathy ([place]) [K72.90] Hyperammonemia ([place]) [E72.20] Altered mental status, unspecified altered mental status type [R41.82] Anemia, unspecified type [D64.9]  DISCHARGE DIAGNOSIS:  Hepatic encephalopathy--improved Cirrhosis of liver--chronic Acute on chronic anemia secondary to GI bleed suspected due to AVMs Chronic kidney disease Diabetes on insulin Bilateral chronic venous stasis changes with Unna boots--acute mild cellulitis SECONDARY DIAGNOSIS:   Past Medical History:  Diagnosis Date  . Anemia   . Anxiety    controlled;   . Arthritis   . AVM (arteriovenous malformation) of stomach, acquired with hemorrhage   . Barrett's esophagus   . Chronic kidney disease    renal infufficiency  . Cirrhosis ([place])   . Depression    controlled;   Marland Kitchen Diabetes mellitus without complication ([place])    not controlled, taking insulin but sugar continues to run high;   . Edema   . Esophageal varices ([place])   . GERD (gastroesophageal reflux disease)   . History of hiatal hernia   . Hyperlipidemia   . Hypertension    controlled well;   . 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 Obesity   . Pancytopenia ([place])   . Polyp, stomach    with chronic blood loss  . Sleep apnea    does not wear a cpap, Medicare would not pay for it   . Venous stasis dermatitis of both lower extremities   . Venous stasis of both lower extremities    with cellulitis    HOSPITAL COURSE:   Corey A Jamesis a 67 y.o.y/o malewith h/o liver cirrhosis possibly due to NAFLD known to have chronic blood loss anemia. He wasfound by neighbor on the floor-possibly on the floor for 24 hours, EMS found the  patient confused and disoriented, per records-patient was recently at [facility] as well as [name] for elevated ammonia level and lower extremity swelling, patient is admitted for acute recurrent hepatic encephalopathy, bilateral lower extremity cellulitis, and acute on chronic anemia.  *acute recurrent hepatic encephalopathy  - appreciate GI input presently not encephalopathic. Per GI, this islikely secondary to cellulitis and AKIas precipitating factor.  - cut back on lactulose to once daily (from QID), titrate to two soft bowel movements.  Continue Xifaxan.  *acute bilateral lower extremity cellulitis compounded by chronic Bilateral lower extremity venous Insufficiency - Keflex for 5-7-day course, follow-up on cultures, wound RN changed the dressing and input appreciated -She will follow the wound clinic  *acute on chronic worsening anemia most likely secondary to - per Niece patient had capsule endoscopy which shows 3 areas of bleed and has follow up on April 3 at [facility] - AvoidAntiplatelet/anticoagulants/NSAID medications,s/ptransfusion1 unit packed red blood cells, H&H every 8 hours, CBC daily, and transfuse as needed -hemoglobin stable at 7.1.  No active bleeding.  Continue iron pills. -No further workup per GI  *chronic extreme morbid obesity Most likely secondary to excess calories Lifestyle modification recommended  *chronic diabetes mellitus type 2 Sliding scale insulin with Accu-Cheks per routine -Patient's insulin dosage has been adjusted to 20 units twice daily.  * chronic cirrhosis of the liver Continue Lasix, spironolactone, Xifaxan  * chronic GERD without esophagitis PPI daily  Spoke with niece  over the phone. Care management to help arrange home health RN, PT, social worker, aide    CONSULTS OBTAINED:  Treatment Team:  Jonathon Bellows, MD Fritzi Mandes, MD  DRUG ALLERGIES:  No Known Allergies  DISCHARGE MEDICATIONS:   Allergies as  of 12/18/2017   No Known Allergies     Medication List    TAKE these medications   cephALEXin 500 MG capsule Commonly known as:  KEFLEX Take 1 capsule (500 mg total) by mouth every 8 (eight) hours.   FERROUS SULFATE ER PO Take 325 mg by mouth 2 (two) times daily.   furosemide 40 MG tablet Commonly known as:  LASIX Take 40 mg by mouth 2 (two) times daily.   insulin aspart protamine- aspart (70-30) 100 UNIT/ML injection Commonly known as:  NOVOLOG MIX 70/30 Inject 0.2 mLs (20 Units total) into the skin 2 (two) times daily with a meal. What changed:    how much to take  when to take this  additional instructions   lactulose 10 GM/15ML solution Commonly known as:  CHRONULAC Take 30 mLs (20 g total) by mouth 2 (two) times daily. What changed:  when to take this   LORazepam 0.5 MG tablet Commonly known as:  ATIVAN Take 0.5 mg by mouth 3 (three) times daily as needed for anxiety.   Magnesium Oxide 400 (240 Mg) MG Tabs Take 1 tablet (400 mg total) by mouth every morning.   multivitamin tablet Take 1 tablet by mouth daily.   pantoprazole 40 MG tablet Commonly known as:  PROTONIX Take 1 tablet (40 mg total) by mouth daily.   potassium chloride SA 20 MEQ tablet Commonly known as:  K-DUR,KLOR-CON Take 2 tablets (40 mEq total) by mouth 2 (two) times daily.   spironolactone 25 MG tablet Commonly known as:  ALDACTONE Take 1 tablet (25 mg total) by mouth 2 (two) times daily.   traMADol 50 MG tablet Commonly known as:  ULTRAM Take 50 mg by mouth every 8 (eight) hours as needed.       If you experience worsening of your admission symptoms, develop shortness of breath, life threatening emergency, suicidal or homicidal thoughts you must seek medical attention immediately by calling 911 or calling your MD immediately  if symptoms less severe.  You Must read complete instructions/literature along with all the possible adverse reactions/side effects for all the Medicines you  take and that have been prescribed to you. Take any new Medicines after you have completely understood and accept all the possible adverse reactions/side effects.   Please note  You were cared for by a hospitalist during your hospital stay. If you have any questions about your discharge medications or the care you received while you were in the hospital after you are discharged, you can call the unit and asked to speak with the hospitalist on call if the hospitalist that took care of you is not available. Once you are discharged, your primary care physician will handle any further medical issues. Please note that NO REFILLS for any discharge medications will be authorized once you are discharged, as it is imperative that you return to your primary care physician (or establish a relationship with a primary care physician if you do not have one) for your aftercare needs so that they can reassess your need for medications and monitor your lab values. Today   SUBJECTIVE   No new complaints  VITAL SIGNS:  Blood pressure (!) 135/51, pulse 72, temperature 98.6 F (37 C), temperature source  Oral, resp. rate 18, height 5\' 9"  (1.753 m), weight (!) 156 kg (344 lb), SpO2 100 %.  I/O:    Intake/Output Summary (Last 24 hours) at 12/18/2017 1202 Last data filed at 12/18/2017 1040 Gross per 24 hour  Intake 1100 ml  Output 450 ml  Net 650 ml    PHYSICAL EXAMINATION:  GENERAL:  67 y.o.-year-old patient lying in the bed with no acute distress.  Morbid obesity EYES: Pupils equal, round, reactive to light and accommodation. No scleral icterus. Extraocular muscles intact.  HEENT: Head atraumatic, normocephalic. Oropharynx and nasopharynx clear.  NECK:  Supple, no jugular venous distention. No thyroid enlargement, no tenderness.  LUNGS: Normal breath sounds bilaterally, no wheezing, rales,rhonchi or crepitation. No use of accessory muscles of respiration.  CARDIOVASCULAR: S1, S2 normal. No murmurs, rubs, or  gallops.  ABDOMEN: Soft, non-tender, non-distended. Bowel sounds present. No organomegaly or mass.  EXTREMITIES: Lateral lower extremity Unna boot NEUROLOGIC: Cranial nerves II through XII are intact. Muscle strength 5/5 in all extremities. Sensation intact. Gait not checked.  PSYCHIATRIC: The patient is alert and oriented x 3.  SKIN: No obvious rash, lesion, or ulcer.   DATA REVIEW:   CBC  Recent Labs  Lab 12/18/17 0346  WBC 5.0  HGB 7.1*  HCT 21.9*  PLT 99*    Chemistries  Recent Labs  Lab 12/17/17 2334  NA 140  K 3.6  CL 109  CO2 22  GLUCOSE 209*  BUN 22*  CREATININE 1.95*  CALCIUM 8.9  AST 33  ALT 20  ALKPHOS 63  BILITOT 0.7    Microbiology Results   No results found for this or any previous visit (from the past 240 hour(s)).  RADIOLOGY:  No results found.   Management plans discussed with the patient, family and they are in agreement.  CODE STATUS:     Code Status Orders  (From admission, onward)        Start     Ordered   12/16/17 0922  Do not attempt resuscitation (DNR)  Continuous    Question Answer Comment  In the event of cardiac or respiratory ARREST Do not call a "code blue"   In the event of cardiac or respiratory ARREST Do not perform Intubation, CPR, defibrillation or ACLS   In the event of cardiac or respiratory ARREST Use medication by any route, position, wound care, and other measures to relive pain and suffering. May use oxygen, suction and manual treatment of airway obstruction as needed for comfort.      12/16/17 0921    Code Status History    Date Active Date Inactive Code Status Order ID Comments User Context   12/16/2017 03:05 12/16/2017 09:21 Full Code [id number]  Gorden Harms, MD ED   12/02/2017 20:03 12/05/2017 20:00 Full Code [id number]  Saundra Shelling, MD Inpatient   11/21/2017 19:48 11/23/2017 19:58 Full Code [id number]  Loletha Grayer, MD ED   10/23/2017 14:05 10/26/2017 14:02 Full Code [id number]  Bettey Costa, MD Inpatient    10/18/2017 00:58 10/19/2017 18:06 Full Code [id number]  Saundra Shelling, MD Inpatient   10/09/2017 21:39 10/11/2017 18:06 Full Code [id number]  [name], MD Inpatient   09/03/2017 21:24 09/05/2017 15:52 Full Code [id number]  Nicholes Mango, MD Inpatient   06/26/2017 11:19 06/28/2017 17:48 Full Code [id number]  Henreitta Leber, MD Inpatient   02/21/2017 20:07 02/22/2017 18:03 Full Code [id number]  [name], MD Inpatient   07/20/2015 12:45 07/22/2015 22:17 Full Code [id number]  Nicholes Mango, MD Inpatient  02/08/2015 23:31 02/11/2015 19:43 Full Code [id number]  Hower, Aaron Mose, MD ED      TOTAL TIME TAKING CARE OF THIS PATIENT: 40 minutes.    Fritzi Mandes M.D on 12/18/2017 at 12:02 PM  Between 7am to 6pm - Pager - [phone] After 6pm go to [email or web address] - password [email or web address] [facility]  Office  [phone]  CC: Primary care physician; Baxter Hire, MD

## 2017-12-19 ENCOUNTER — Inpatient Hospital Stay: Payer: Medicare Other | Admitting: Oncology

## 2017-12-19 ENCOUNTER — Inpatient Hospital Stay: Payer: Medicare Other

## 2017-12-19 LAB — TYPE AND SCREEN
ABO/RH(D): A NEG
Antibody Screen: NEGATIVE
UNIT DIVISION: 0
Unit division: 0

## 2017-12-19 LAB — BPAM RBC
Blood Product Expiration Date: 201903222359
Blood Product Expiration Date: 201904012359
ISSUE DATE / TIME: 201903180417
UNIT TYPE AND RH: 600
UNIT TYPE AND RH: 600

## 2017-12-20 LAB — PREPARE RBC (CROSSMATCH)

## 2017-12-24 ENCOUNTER — Telehealth: Payer: Self-pay

## 2017-12-24 NOTE — Telephone Encounter (Signed)
EMMI Follow-up:   Patient had received a call from my number and I explained our automated process of calls after discharge.  He said he didn't have time to talk as he was talking with his employer. He stated, he did not have any discharge concerns.

## 2017-12-25 ENCOUNTER — Encounter: Payer: Self-pay | Admitting: *Deleted

## 2017-12-25 ENCOUNTER — Other Ambulatory Visit: Payer: Self-pay

## 2017-12-25 ENCOUNTER — Observation Stay
Admission: EM | Admit: 2017-12-25 | Discharge: 2017-12-26 | Disposition: A | Discharge status: Home / Self Care | Payer: Medicare Other | Attending: Internal Medicine | Admitting: Internal Medicine

## 2017-12-25 ENCOUNTER — Emergency Department: Payer: Medicare Other

## 2017-12-25 DIAGNOSIS — F329 Major depressive disorder, single episode, unspecified: Secondary | ICD-10-CM | POA: Insufficient documentation

## 2017-12-25 DIAGNOSIS — I129 Hypertensive chronic kidney disease with stage 1 through stage 4 chronic kidney disease, or unspecified chronic kidney disease: Secondary | ICD-10-CM | POA: Diagnosis not present

## 2017-12-25 DIAGNOSIS — E1121 Type 2 diabetes mellitus with diabetic nephropathy: Secondary | ICD-10-CM | POA: Insufficient documentation

## 2017-12-25 DIAGNOSIS — G4730 Sleep apnea, unspecified: Secondary | ICD-10-CM | POA: Insufficient documentation

## 2017-12-25 DIAGNOSIS — M1990 Unspecified osteoarthritis, unspecified site: Secondary | ICD-10-CM | POA: Diagnosis not present

## 2017-12-25 DIAGNOSIS — Z794 Long term (current) use of insulin: Secondary | ICD-10-CM | POA: Diagnosis not present

## 2017-12-25 DIAGNOSIS — K7290 Hepatic failure, unspecified without coma: Secondary | ICD-10-CM | POA: Diagnosis not present

## 2017-12-25 DIAGNOSIS — E1122 Type 2 diabetes mellitus with diabetic chronic kidney disease: Secondary | ICD-10-CM | POA: Diagnosis not present

## 2017-12-25 DIAGNOSIS — K7460 Unspecified cirrhosis of liver: Secondary | ICD-10-CM | POA: Diagnosis not present

## 2017-12-25 DIAGNOSIS — I872 Venous insufficiency (chronic) (peripheral): Secondary | ICD-10-CM | POA: Insufficient documentation

## 2017-12-25 DIAGNOSIS — F419 Anxiety disorder, unspecified: Secondary | ICD-10-CM | POA: Diagnosis not present

## 2017-12-25 DIAGNOSIS — Z8249 Family history of ischemic heart disease and other diseases of the circulatory system: Secondary | ICD-10-CM | POA: Insufficient documentation

## 2017-12-25 DIAGNOSIS — Z9889 Other specified postprocedural states: Secondary | ICD-10-CM | POA: Diagnosis not present

## 2017-12-25 DIAGNOSIS — E785 Hyperlipidemia, unspecified: Secondary | ICD-10-CM | POA: Diagnosis not present

## 2017-12-25 DIAGNOSIS — E669 Obesity, unspecified: Secondary | ICD-10-CM | POA: Diagnosis not present

## 2017-12-25 DIAGNOSIS — Z6841 Body Mass Index (BMI) 40.0 and over, adult: Secondary | ICD-10-CM | POA: Insufficient documentation

## 2017-12-25 DIAGNOSIS — Z79899 Other long term (current) drug therapy: Secondary | ICD-10-CM | POA: Diagnosis not present

## 2017-12-25 DIAGNOSIS — D500 Iron deficiency anemia secondary to blood loss (chronic): Secondary | ICD-10-CM | POA: Diagnosis not present

## 2017-12-25 DIAGNOSIS — K2270 Barrett's esophagus without dysplasia: Secondary | ICD-10-CM | POA: Diagnosis not present

## 2017-12-25 DIAGNOSIS — N183 Chronic kidney disease, stage 3 (moderate): Secondary | ICD-10-CM | POA: Insufficient documentation

## 2017-12-25 DIAGNOSIS — F17290 Nicotine dependence, other tobacco product, uncomplicated: Secondary | ICD-10-CM | POA: Diagnosis not present

## 2017-12-25 DIAGNOSIS — D649 Anemia, unspecified: Secondary | ICD-10-CM

## 2017-12-25 LAB — CBC
HCT: 19.9 % — ABNORMAL LOW (ref 40.0–52.0)
HCT: 19 % — ABNORMAL LOW (ref 40.0–52.0)
Hemoglobin: 6.5 g/dL — ABNORMAL LOW (ref 13.0–18.0)
Hemoglobin: 6.2 g/dL — ABNORMAL LOW (ref 13.0–18.0)
MCH: 31.3 pg (ref 26.0–34.0)
MCH: 30.2 pg (ref 26.0–34.0)
MCHC: 32.8 g/dL (ref 32.0–36.0)
MCHC: 32.6 g/dL (ref 32.0–36.0)
MCV: 95.5 fL (ref 80.0–100.0)
MCV: 92.6 fL (ref 80.0–100.0)
PLATELETS: 131 10*3/uL — AB (ref 150–440)
PLATELETS: 115 10*3/uL — AB (ref 150–440)
RBC: 2.08 MIL/uL — AB (ref 4.40–5.90)
RBC: 2.05 MIL/uL — AB (ref 4.40–5.90)
RDW: 17.3 % — AB (ref 11.5–14.5)
RDW: 17.3 % — ABNORMAL HIGH (ref 11.5–14.5)
WBC: 4.9 10*3/uL (ref 3.8–10.6)
WBC: 4.3 10*3/uL (ref 3.8–10.6)

## 2017-12-25 LAB — COMPREHENSIVE METABOLIC PANEL
ALT: 19 U/L (ref 17–63)
AST: 36 U/L (ref 15–41)
Albumin: 2.6 g/dL — ABNORMAL LOW (ref 3.5–5.0)
Alkaline Phosphatase: 65 U/L (ref 38–126)
Anion gap: 9 (ref 5–15)
BUN: 29 mg/dL — AB (ref 6–20)
CHLORIDE: 107 mmol/L (ref 101–111)
CO2: 21 mmol/L — AB (ref 22–32)
CREATININE: 1.95 mg/dL — AB (ref 0.61–1.24)
Calcium: 8.6 mg/dL — ABNORMAL LOW (ref 8.9–10.3)
GFR calc Af Amer: 40 mL/min — ABNORMAL LOW (ref >60)
GFR calc non Af Amer: 34 mL/min — ABNORMAL LOW (ref >60)
Glucose, Bld: 114 mg/dL — ABNORMAL HIGH (ref 65–99)
Potassium: 4.3 mmol/L (ref 3.5–5.1)
SODIUM: 137 mmol/L (ref 135–145)
Total Bilirubin: 0.6 mg/dL (ref 0.3–1.2)
Total Protein: 6.6 g/dL (ref 6.5–8.1)

## 2017-12-25 LAB — GLUCOSE, CAPILLARY
GLUCOSE-CAPILLARY: 136 mg/dL — AB (ref 65–99)
Glucose-Capillary: 150 mg/dL — ABNORMAL HIGH (ref 65–99)

## 2017-12-25 LAB — PREPARE RBC (CROSSMATCH)

## 2017-12-25 MED ORDER — DOCUSATE SODIUM 100 MG PO CAPS
100.0000 mg | ORAL_CAPSULE | Freq: Two times a day (BID) | ORAL | Status: DC
Start: 2017-12-25 — End: 2017-12-25

## 2017-12-25 MED ORDER — SPIRONOLACTONE 25 MG PO TABS
25.0000 mg | ORAL_TABLET | Freq: Two times a day (BID) | ORAL | Status: DC
  Administered 2017-12-25 – 2017-12-26 (×2): 25 mg via ORAL
  Filled 2017-12-25 (×2): qty 1

## 2017-12-25 MED ORDER — PANTOPRAZOLE SODIUM 40 MG PO TBEC: 40.0000 mg | DELAYED_RELEASE_TABLET | Freq: Every day | ORAL | Status: DC

## 2017-12-25 MED ORDER — ACETAMINOPHEN 325 MG PO TABS: 650.0000 mg | ORAL_TABLET | Freq: Four times a day (QID) | ORAL | Status: DC | PRN

## 2017-12-25 MED ORDER — ONDANSETRON HCL 4 MG PO TABS
4.0000 mg | ORAL_TABLET | Freq: Four times a day (QID) | ORAL | Status: DC | PRN
  Administered 2017-12-26: 4 mg via ORAL
  Filled 2017-12-25: qty 1

## 2017-12-25 MED ORDER — ACETAMINOPHEN 650 MG RE SUPP: 650.0000 mg | Freq: Four times a day (QID) | RECTAL | Status: DC | PRN

## 2017-12-25 MED ORDER — LACTULOSE 10 GM/15ML PO SOLN: 20.0000 g | Freq: Two times a day (BID) | ORAL | Status: DC

## 2017-12-25 MED ORDER — FERROUS SULFATE 325 (65 FE) MG PO TABS
325.0000 mg | ORAL_TABLET | Freq: Two times a day (BID) | ORAL | Status: DC
  Administered 2017-12-25 – 2017-12-26 (×2): 325 mg via ORAL
  Filled 2017-12-25 (×2): qty 1

## 2017-12-25 MED ORDER — FUROSEMIDE 40 MG PO TABS
40.0000 mg | ORAL_TABLET | Freq: Two times a day (BID) | ORAL | Status: DC
  Administered 2017-12-25 – 2017-12-26 (×2): 40 mg via ORAL
  Filled 2017-12-25 (×2): qty 1

## 2017-12-25 MED ORDER — TRAMADOL HCL 50 MG PO TABS
50.0000 mg | ORAL_TABLET | Freq: Four times a day (QID) | ORAL | Status: DC
  Administered 2017-12-25 – 2017-12-26 (×3): 50 mg via ORAL
  Filled 2017-12-25 (×4): qty 1

## 2017-12-25 MED ORDER — LORAZEPAM 0.5 MG PO TABS: 0.5000 mg | ORAL_TABLET | Freq: Three times a day (TID) | ORAL | Status: DC | PRN

## 2017-12-25 MED ORDER — INSULIN ASPART PROT & ASPART (70-30 MIX) 100 UNIT/ML ~~LOC~~ SUSP
20.0000 [IU] | Freq: Two times a day (BID) | SUBCUTANEOUS | Status: DC
  Administered 2017-12-25: 20 [IU] via SUBCUTANEOUS
  Filled 2017-12-25: qty 1

## 2017-12-25 MED ORDER — PANTOPRAZOLE SODIUM 40 MG PO TBEC
40.0000 mg | DELAYED_RELEASE_TABLET | Freq: Every day | ORAL | Status: DC
  Administered 2017-12-26: 40 mg via ORAL
  Filled 2017-12-25: qty 1

## 2017-12-25 MED ORDER — POTASSIUM CHLORIDE CRYS ER 20 MEQ PO TBCR
40.0000 meq | EXTENDED_RELEASE_TABLET | Freq: Two times a day (BID) | ORAL | Status: DC
  Administered 2017-12-25 – 2017-12-26 (×2): 40 meq via ORAL
  Filled 2017-12-25 (×2): qty 2

## 2017-12-25 MED ORDER — BISACODYL 5 MG PO TBEC: 5.0000 mg | DELAYED_RELEASE_TABLET | Freq: Every day | ORAL | Status: DC | PRN

## 2017-12-25 MED ORDER — MAGNESIUM OXIDE 400 (241.3 MG) MG PO TABS
400.0000 mg | ORAL_TABLET | Freq: Every morning | ORAL | Status: DC
  Administered 2017-12-26: 400 mg via ORAL
  Filled 2017-12-25: qty 1

## 2017-12-25 MED ORDER — ADULT MULTIVITAMIN W/MINERALS CH
1.0000 | ORAL_TABLET | Freq: Every day | ORAL | Status: DC
  Administered 2017-12-26: 1 via ORAL
  Filled 2017-12-25: qty 1

## 2017-12-25 MED ORDER — TRAZODONE HCL 50 MG PO TABS: 25.0000 mg | ORAL_TABLET | Freq: Every evening | ORAL | Status: DC | PRN

## 2017-12-25 MED ORDER — SODIUM CHLORIDE 0.9 % IV SOLN
10.0000 mL/h | Freq: Once | INTRAVENOUS | Status: AC
  Administered 2017-12-25: 10 mL/h via INTRAVENOUS

## 2017-12-25 MED ORDER — ONDANSETRON HCL 4 MG/2ML IJ SOLN: 4.0000 mg | Freq: Four times a day (QID) | INTRAMUSCULAR | Status: DC | PRN

## 2017-12-25 MED ORDER — HYDROCODONE-ACETAMINOPHEN 5-325 MG PO TABS: 1.0000 | ORAL_TABLET | ORAL | Status: DC | PRN

## 2017-12-25 NOTE — ED Provider Notes (Signed)
[facility] Emergency Department Provider Note    First MD Initiated Contact with Patient 12/25/17 1341     (approximate)  I have reviewed the triage vital signs and the nursing notes.   HISTORY  Chief Complaint Weakness    HPI Corey West is a 67 y.o. male history of CKD as well as cirrhosis and esophageal varices with chronic GI bleed followed by [facility] requiring frequent transfusions for symptomatic anemia presents to the ER with generalized weakness and worsening symptomatic anemia.  Denies any worsening abdominal pain.  Has noted intermittent blood in his stools.  Denies any fevers.  Denies any chest pain but does have significant weakness and shortness of breath with ambulation.  Past Medical History:  Diagnosis Date  . Anemia   . Anxiety    controlled;   . Arthritis   . AVM (arteriovenous malformation) of stomach, acquired with hemorrhage   . Barrett's esophagus   . Chronic kidney disease    renal infufficiency  . Cirrhosis ([place])   . Depression    controlled;   Marland Kitchen Diabetes mellitus without complication ([place])    not controlled, taking insulin but sugar continues to run high;   . Edema   . Esophageal varices ([place])   . GERD (gastroesophageal reflux disease)   . History of hiatal hernia   . Hyperlipidemia   . Hypertension    controlled well;   . 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 Obesity   . Pancytopenia ([place])   . Polyp, stomach    with chronic blood loss  . Sleep apnea    does not wear a cpap, Medicare would not pay for it   . Venous stasis dermatitis of both lower extremities   . Venous stasis of both lower extremities    with cellulitis   Family History  Problem Relation Age of Onset  . Diabetes Other   . Transient ischemic attack Father   . CAD Father    Past Surgical History:  Procedure Laterality Date  . ESOPHAGOGASTRODUODENOSCOPY N/A 02/09/2015   Procedure: ESOPHAGOGASTRODUODENOSCOPY (EGD);  Surgeon: Manya Silvas, MD;   Location: [facility] ENDOSCOPY;  Service: Endoscopy;  Laterality: N/A;  . ESOPHAGOGASTRODUODENOSCOPY N/A 07/22/2015   Procedure: ESOPHAGOGASTRODUODENOSCOPY (EGD);  Surgeon: Manya Silvas, MD;  Location: [facility] ENDOSCOPY;  Service: Endoscopy;  Laterality: N/A;  . ESOPHAGOGASTRODUODENOSCOPY N/A 06/26/2017   Procedure: ESOPHAGOGASTRODUODENOSCOPY (EGD);  Surgeon: Manya Silvas, MD;  Location: [facility] ENDOSCOPY;  Service: Endoscopy;  Laterality: N/A;  . ESOPHAGOGASTRODUODENOSCOPY N/A 06/27/2017   Procedure: ESOPHAGOGASTRODUODENOSCOPY (EGD);  Surgeon: Manya Silvas, MD;  Location: [facility] ENDOSCOPY;  Service: Endoscopy;  Laterality: N/A;  . ESOPHAGOGASTRODUODENOSCOPY N/A 06/28/2017   Procedure: ESOPHAGOGASTRODUODENOSCOPY (EGD);  Surgeon: Manya Silvas, MD;  Location: [facility] ENDOSCOPY;  Service: Endoscopy;  Laterality: N/A;  . ESOPHAGOGASTRODUODENOSCOPY Bilateral 10/11/2017   Procedure: ESOPHAGOGASTRODUODENOSCOPY (EGD);  Surgeon: Manya Silvas, MD;  Location: [facility] ENDOSCOPY;  Service: Endoscopy;  Laterality: Bilateral;  . ESOPHAGOGASTRODUODENOSCOPY N/A 12/04/2017   Procedure: ESOPHAGOGASTRODUODENOSCOPY (EGD);  Surgeon: Manya Silvas, MD;  Location: [facility] ENDOSCOPY;  Service: Endoscopy;  Laterality: N/A;  . ESOPHAGOGASTRODUODENOSCOPY (EGD) WITH PROPOFOL N/A 07/20/2015   Procedure: ESOPHAGOGASTRODUODENOSCOPY (EGD) WITH PROPOFOL;  Surgeon: Manya Silvas, MD;  Location: [facility] ENDOSCOPY;  Service: Endoscopy;  Laterality: N/A;  . ESOPHAGOGASTRODUODENOSCOPY (EGD) WITH PROPOFOL N/A 09/16/2015   Procedure: ESOPHAGOGASTRODUODENOSCOPY (EGD) WITH PROPOFOL;  Surgeon: Manya Silvas, MD;  Location: [facility] ENDOSCOPY;  Service: Endoscopy;  Laterality: N/A;  . ESOPHAGOGASTRODUODENOSCOPY (EGD) WITH PROPOFOL N/A  03/16/2016   Procedure: ESOPHAGOGASTRODUODENOSCOPY (EGD) WITH PROPOFOL;  Surgeon: Manya Silvas, MD;  Location: [facility] ENDOSCOPY;  Service: Endoscopy;  Laterality: N/A;  . ESOPHAGOGASTRODUODENOSCOPY (EGD) WITH  PROPOFOL N/A 09/14/2016   Procedure: ESOPHAGOGASTRODUODENOSCOPY (EGD) WITH PROPOFOL;  Surgeon: Manya Silvas, MD;  Location: [facility] ENDOSCOPY;  Service: Endoscopy;  Laterality: N/A;  . ESOPHAGOGASTRODUODENOSCOPY (EGD) WITH PROPOFOL N/A 11/05/2016   Procedure: ESOPHAGOGASTRODUODENOSCOPY (EGD) WITH PROPOFOL;  Surgeon: Manya Silvas, MD;  Location: [facility] ENDOSCOPY;  Service: Endoscopy;  Laterality: N/A;  . ESOPHAGOGASTRODUODENOSCOPY (EGD) WITH PROPOFOL N/A 02/06/2017   Procedure: ESOPHAGOGASTRODUODENOSCOPY (EGD) WITH PROPOFOL;  Surgeon: Manya Silvas, MD;  Location: [facility] ENDOSCOPY;  Service: Endoscopy;  Laterality: N/A;  . ESOPHAGOGASTRODUODENOSCOPY (EGD) WITH PROPOFOL N/A 04/24/2017   Procedure: ESOPHAGOGASTRODUODENOSCOPY (EGD) WITH PROPOFOL;  Surgeon: Manya Silvas, MD;  Location: [facility] ENDOSCOPY;  Service: Endoscopy;  Laterality: N/A;  . ESOPHAGOGASTRODUODENOSCOPY (EGD) WITH PROPOFOL N/A 07/31/2017   Procedure: ESOPHAGOGASTRODUODENOSCOPY (EGD) WITH PROPOFOL;  Surgeon: Manya Silvas, MD;  Location: [facility] ENDOSCOPY;  Service: Endoscopy;  Laterality: N/A;  . GIVENS CAPSULE STUDY N/A 12/05/2017   Procedure: GIVENS CAPSULE STUDY;  Surgeon: Manya Silvas, MD;  Location: [facility] ENDOSCOPY;  Service: Endoscopy;  Laterality: N/A;  . TONSILLECTOMY    . TONSILLECTOMY AND ADENOIDECTOMY    . ULNAR NERVE TRANSPOSITION    . UVULOPALATOPHARYNGOPLASTY     Patient Active Problem List   Diagnosis Date Noted  . Goals of care, counseling/discussion   . Palliative care encounter   . Hepatic encephalopathy syndrome ([place]) 12/16/2017  . Hepatic encephalopathy ([place]) 10/18/2017  . Hypokalemia 10/09/2017  . Hyperglycemia 10/09/2017  . Symptomatic anemia 06/26/2017  . Sepsis ([place]) 06/12/2017  . Cellulitis in diabetic foot ([place]) 02/21/2017  . Uncontrolled diabetes mellitus ([place]) 02/21/2017  . Cellulitis 02/21/2017  . GI bleed 07/20/2015  . Hematemesis 02/08/2015  . Cirrhosis ([place]) 02/08/2015  . Esophageal  varices ([place]) 02/08/2015  . Type 2 diabetes mellitus ([place]) 02/08/2015  . Gastroesophageal reflux disease 02/08/2015  . Hyperlipidemia 02/08/2015  . Iron deficiency anemia 12/04/2013  . Thrombocytopenia ([place]) 12/04/2013  . Splenomegaly 11/04/2013      Prior to Admission medications   Medication Sig Start Date End Date Taking? Authorizing Provider  cephALEXin (KEFLEX) 500 MG capsule Take 1 capsule (500 mg total) by mouth every 8 (eight) hours. 12/18/17   Fritzi Mandes, MD  FERROUS SULFATE ER PO Take 325 mg by mouth 2 (two) times daily.     [provider]  furosemide (LASIX) 40 MG tablet Take 40 mg by mouth 2 (two) times daily.    [provider]  insulin aspart protamine- aspart (NOVOLOG MIX 70/30) (70-30) 100 UNIT/ML injection Inject 0.2 mLs (20 Units total) into the skin 2 (two) times daily with a meal. 12/18/17   Fritzi Mandes, MD  lactulose (CHRONULAC) 10 GM/15ML solution Take 30 mLs (20 g total) by mouth 2 (two) times daily. 12/18/17   Fritzi Mandes, MD  LORazepam (ATIVAN) 0.5 MG tablet Take 0.5 mg by mouth 3 (three) times daily as needed for anxiety.     [provider]  Magnesium Oxide 400 (240 Mg) MG TABS Take 1 tablet (400 mg total) by mouth every morning. 12/05/17   Loletha Grayer, MD  Multiple Vitamin (MULTIVITAMIN) tablet Take 1 tablet by mouth daily.    [provider]  pantoprazole (PROTONIX) 40 MG tablet Take 1 tablet (40 mg total) by mouth daily. 12/05/17 12/05/18  Loletha Grayer, MD  potassium chloride SA (K-DUR,KLOR-CON) 20 MEQ  tablet Take 2 tablets (40 mEq total) by mouth 2 (two) times daily. 12/05/17   Loletha Grayer, MD  spironolactone (ALDACTONE) 25 MG tablet Take 1 tablet (25 mg total) by mouth 2 (two) times daily. 12/05/17   Loletha Grayer, MD  traMADol (ULTRAM) 50 MG tablet Take 50 mg by mouth every 8 (eight) hours as needed.     [provider]    Allergies Patient has no known allergies.    Social History Social History    Tobacco Use  . Smoking status: Current Some Day Smoker    Types: Cigars  . Smokeless tobacco: Never Used  . Tobacco comment: smoke 1 at night occasionally;   Substance Use Topics  . Alcohol use: No    Comment: stopped 12 years ago  . Drug use: No    Review of Systems Patient denies headaches, rhinorrhea, blurry vision, numbness, shortness of breath, chest pain, edema, cough, abdominal pain, nausea, vomiting, diarrhea, dysuria, fevers, rashes or hallucinations unless otherwise stated above in HPI. ____________________________________________   PHYSICAL EXAM:  VITAL SIGNS: Vitals:   12/25/17 1335  BP: (!) 143/41  Pulse: 81  Resp: 16  Temp: 98.2 F (36.8 C)  SpO2: 100%    Constitutional: Alert and oriented. Chronically ill appearing and pale. Eyes: Conjunctivae are normal.  Head: Atraumatic. Nose: No congestion/rhinnorhea. Mouth/Throat: Mucous membranes are moist.   Neck: No stridor. Painless ROM.  Cardiovascular: Normal rate, regular rhythm. Grossly normal heart sounds.  Good peripheral circulation. Respiratory: Normal respiratory effort.  No retractions. Lungs CTAB. Gastrointestinal: Soft and nontender. No distention. No abdominal bruits. No CVA tenderness. Genitourinary:  Musculoskeletal: No lower extremity tenderness nor edema.  No joint effusions. Neurologic:  Normal speech and language. No gross focal neurologic deficits are appreciated. No facial droop Skin:  Skin is warm, dry and intact. No rash noted. Psychiatric: Mood and affect are normal. Speech and behavior are normal.  ____________________________________________   LABS (all labs ordered are listed, but only abnormal results are displayed)  Results for orders placed or performed during the hospital encounter of 12/25/17 (from the past 24 hour(s))  Comprehensive metabolic panel     Status: Abnormal   Collection Time: 12/25/17  1:46 PM  Result Value Ref Range   Sodium 137 135 - 145 mmol/L   Potassium  4.3 3.5 - 5.1 mmol/L   Chloride 107 101 - 111 mmol/L   CO2 21 (L) 22 - 32 mmol/L   Glucose, Bld 114 (H) 65 - 99 mg/dL   BUN 29 (H) 6 - 20 mg/dL   Creatinine, Ser 1.95 (H) 0.61 - 1.24 mg/dL   Calcium 8.6 (L) 8.9 - 10.3 mg/dL   Total Protein 6.6 6.5 - 8.1 g/dL   Albumin 2.6 (L) 3.5 - 5.0 g/dL   AST 36 15 - 41 U/L   ALT 19 17 - 63 U/L   Alkaline Phosphatase 65 38 - 126 U/L   Total Bilirubin 0.6 0.3 - 1.2 mg/dL   GFR calc non Af Amer 34 (L) >60 mL/min   GFR calc Af Amer 40 (L) >60 mL/min   Anion gap 9 5 - 15  CBC     Status: Abnormal   Collection Time: 12/25/17  1:46 PM  Result Value Ref Range   WBC 4.9 3.8 - 10.6 K/uL   RBC 2.08 (L) 4.40 - 5.90 MIL/uL   Hemoglobin 6.5 (L) 13.0 - 18.0 g/dL   HCT 19.9 (L) 40.0 - 52.0 %   MCV 95.5 80.0 - 100.0  fL   MCH 31.3 26.0 - 34.0 pg   MCHC 32.8 32.0 - 36.0 g/dL   RDW 17.3 (H) 11.5 - 14.5 %   Platelets 131 (L) 150 - 440 K/uL  Type and screen [facility]     Status: None (Preliminary result)   Collection Time: 12/25/17  1:46 PM  Result Value Ref Range   ABO/RH(D) PENDING    Antibody Screen PENDING    Sample Expiration      12/28/2017 Performed at [facility], [address] [place], [place] [address]    ____________________________________________  EKG My review and personal interpretation at Time: 13:33   Indication: weakness  Rate: 80  Rhythm: sinus Axis: normal Other: borderline prolonged pr, normal qt, no stemi ____________________________________________  RADIOLOGY  I personally reviewed all radiographic images ordered to evaluate for the above acute complaints and reviewed radiology reports and findings.  These findings were personally discussed with the patient.  Please see medical record for radiology report.  ____________________________________________   PROCEDURES  Procedure(s) performed:  .Critical Care Performed by: Merlyn Lot, MD Authorized by: Merlyn Lot, MD    Critical care provider statement:    Critical care time (minutes):  10   Critical care time was exclusive of:  Separately billable procedures and treating other patients   Critical care was necessary to treat or prevent imminent or life-threatening deterioration of the following conditions: symptomatic anemia.   Critical care was time spent personally by me on the following activities:  Development of treatment plan with patient or surrogate, discussions with consultants, evaluation of patient's response to treatment, examination of patient, obtaining history from patient or surrogate, ordering and performing treatments and interventions, ordering and review of laboratory studies, ordering and review of radiographic studies, pulse oximetry, re-evaluation of patient's condition and review of old charts      Critical Care performed: no ____________________________________________   INITIAL IMPRESSION / [place] / ED COURSE  Pertinent labs & imaging results that were available during my care of the patient were reviewed by me and considered in my medical decision making (see chart for details).  DDX: UGIB, LGIB, variceal bleed, symptomatic anemia, chf, sepsis  JAYZON TARAS is a 67 y.o. who presents to the ED with symptoms as described above.  Patient does have evidence of acute on chronic anemia and given his congestive heart failure is likely having worsening symptoms now below 7.  Given his comorbidities do feel patient will require admission for transfusion.  I have ordered 2 units per patient will likely require additional diuresis after volume expansion. Have discussed with the patient and available family all diagnostics and treatments performed thus far and all questions were answered to the best of my ability. The patient demonstrates understanding and agreement with plan.       As part of my medical decision making, I reviewed the following data within the [place] notes reviewed and incorporated, Labs reviewed, notes from prior ED visits and [place] Controlled Substance Database   ____________________________________________   FINAL CLINICAL IMPRESSION(S) / ED DIAGNOSES  Final diagnoses:  Symptomatic anemia      NEW MEDICATIONS STARTED DURING THIS VISIT:  New Prescriptions   No medications on file     Note:  This document was prepared using Dragon voice recognition software and may include unintentional dictation errors.    Merlyn Lot, MD 12/25/17 1446

## 2017-12-25 NOTE — H&P (Signed)
[facility] [place] NAME: Corey West    MR#:  [id number]  DATE OF BIRTH:  January 21, 1951  DATE OF ADMISSION:  12/25/2017  PRIMARY CARE PHYSICIAN: Baxter Hire, MD   REQUESTING/REFERRING PHYSICIAN: Dr. Merlyn Lot  CHIEF COMPLAINT: Weakness   Chief Complaint  Patient presents with  . Weakness    HISTORY OF PRESENT ILLNESS:  Corey West  is a 67 y.o. male with a known history of essential hypertension, diabetes mellitus type 2, history of liver cirrhosis, esophageal varices, chronic GI bleeds gets repeat EGDs by Dr. Vira Agar supposed to see gastroenterologist at [facility] because of symptomatic anemia and recurrent anemia requiring transfusions, repeat EGD comes today because of worsening weakness, patient says that he is feeling so weak that he is using a walker for ambulation recently.  hemoglobin 6.5 this time, patient hemoglobin when he was discharged a week ago on March 20 at 7.1.  Patient consented for blood transfusion and he wants to go home after blood transfusion tomorrow and wants to get the appointment with [facility] on Friday.  Patient says that he does not want to take lactulose in the hospital because of diarrhea and he says he wants to take it after his discharge tomorrow.  PAST MEDICAL HISTORY:   Past Medical History:  Diagnosis Date  . Anemia   . Anxiety    controlled;   . Arthritis   . AVM (arteriovenous malformation) of stomach, acquired with hemorrhage   . Barrett's esophagus   . Chronic kidney disease    renal infufficiency  . Cirrhosis ([place])   . Depression    controlled;   Marland Kitchen Diabetes mellitus without complication ([place])    not controlled, taking insulin but sugar continues to run high;   . Edema   . Esophageal varices ([place])   . GERD (gastroesophageal reflux disease)   . History of hiatal hernia   . Hyperlipidemia   . Hypertension    controlled well;   . 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 Obesity   .  Pancytopenia ([place])   . Polyp, stomach    with chronic blood loss  . Sleep apnea    does not wear a cpap, Medicare would not pay for it   . Venous stasis dermatitis of both lower extremities   . Venous stasis of both lower extremities    with cellulitis    PAST SURGICAL HISTOIRY:   Past Surgical History:  Procedure Laterality Date  . ESOPHAGOGASTRODUODENOSCOPY N/A 02/09/2015   Procedure: ESOPHAGOGASTRODUODENOSCOPY (EGD);  Surgeon: Manya Silvas, MD;  Location: [facility] ENDOSCOPY;  Service: Endoscopy;  Laterality: N/A;  . ESOPHAGOGASTRODUODENOSCOPY N/A 07/22/2015   Procedure: ESOPHAGOGASTRODUODENOSCOPY (EGD);  Surgeon: Manya Silvas, MD;  Location: [facility] ENDOSCOPY;  Service: Endoscopy;  Laterality: N/A;  . ESOPHAGOGASTRODUODENOSCOPY N/A 06/26/2017   Procedure: ESOPHAGOGASTRODUODENOSCOPY (EGD);  Surgeon: Manya Silvas, MD;  Location: [facility] ENDOSCOPY;  Service: Endoscopy;  Laterality: N/A;  . ESOPHAGOGASTRODUODENOSCOPY N/A 06/27/2017   Procedure: ESOPHAGOGASTRODUODENOSCOPY (EGD);  Surgeon: Manya Silvas, MD;  Location: [facility] ENDOSCOPY;  Service: Endoscopy;  Laterality: N/A;  . ESOPHAGOGASTRODUODENOSCOPY N/A 06/28/2017   Procedure: ESOPHAGOGASTRODUODENOSCOPY (EGD);  Surgeon: Manya Silvas, MD;  Location: [facility] ENDOSCOPY;  Service: Endoscopy;  Laterality: N/A;  . ESOPHAGOGASTRODUODENOSCOPY Bilateral 10/11/2017   Procedure: ESOPHAGOGASTRODUODENOSCOPY (EGD);  Surgeon: Manya Silvas, MD;  Location: [facility] ENDOSCOPY;  Service: Endoscopy;  Laterality: Bilateral;  . ESOPHAGOGASTRODUODENOSCOPY N/A 12/04/2017   Procedure: ESOPHAGOGASTRODUODENOSCOPY (EGD);  Surgeon: Manya Silvas, MD;  Location: [facility] ENDOSCOPY;  Service: Endoscopy;  Laterality: N/A;  . ESOPHAGOGASTRODUODENOSCOPY (EGD) WITH PROPOFOL N/A 07/20/2015   Procedure: ESOPHAGOGASTRODUODENOSCOPY (EGD) WITH PROPOFOL;  Surgeon: Manya Silvas, MD;  Location: [facility] ENDOSCOPY;  Service: Endoscopy;  Laterality: N/A;  .  ESOPHAGOGASTRODUODENOSCOPY (EGD) WITH PROPOFOL N/A 09/16/2015   Procedure: ESOPHAGOGASTRODUODENOSCOPY (EGD) WITH PROPOFOL;  Surgeon: Manya Silvas, MD;  Location: [facility] ENDOSCOPY;  Service: Endoscopy;  Laterality: N/A;  . ESOPHAGOGASTRODUODENOSCOPY (EGD) WITH PROPOFOL N/A 03/16/2016   Procedure: ESOPHAGOGASTRODUODENOSCOPY (EGD) WITH PROPOFOL;  Surgeon: Manya Silvas, MD;  Location: [facility] ENDOSCOPY;  Service: Endoscopy;  Laterality: N/A;  . ESOPHAGOGASTRODUODENOSCOPY (EGD) WITH PROPOFOL N/A 09/14/2016   Procedure: ESOPHAGOGASTRODUODENOSCOPY (EGD) WITH PROPOFOL;  Surgeon: Manya Silvas, MD;  Location: [facility] ENDOSCOPY;  Service: Endoscopy;  Laterality: N/A;  . ESOPHAGOGASTRODUODENOSCOPY (EGD) WITH PROPOFOL N/A 11/05/2016   Procedure: ESOPHAGOGASTRODUODENOSCOPY (EGD) WITH PROPOFOL;  Surgeon: Manya Silvas, MD;  Location: [facility] ENDOSCOPY;  Service: Endoscopy;  Laterality: N/A;  . ESOPHAGOGASTRODUODENOSCOPY (EGD) WITH PROPOFOL N/A 02/06/2017   Procedure: ESOPHAGOGASTRODUODENOSCOPY (EGD) WITH PROPOFOL;  Surgeon: Manya Silvas, MD;  Location: [facility] ENDOSCOPY;  Service: Endoscopy;  Laterality: N/A;  . ESOPHAGOGASTRODUODENOSCOPY (EGD) WITH PROPOFOL N/A 04/24/2017   Procedure: ESOPHAGOGASTRODUODENOSCOPY (EGD) WITH PROPOFOL;  Surgeon: Manya Silvas, MD;  Location: [facility] ENDOSCOPY;  Service: Endoscopy;  Laterality: N/A;  . ESOPHAGOGASTRODUODENOSCOPY (EGD) WITH PROPOFOL N/A 07/31/2017   Procedure: ESOPHAGOGASTRODUODENOSCOPY (EGD) WITH PROPOFOL;  Surgeon: Manya Silvas, MD;  Location: [facility] ENDOSCOPY;  Service: Endoscopy;  Laterality: N/A;  . GIVENS CAPSULE STUDY N/A 12/05/2017   Procedure: GIVENS CAPSULE STUDY;  Surgeon: Manya Silvas, MD;  Location: [facility] ENDOSCOPY;  Service: Endoscopy;  Laterality: N/A;  . TONSILLECTOMY    . TONSILLECTOMY AND ADENOIDECTOMY    . ULNAR NERVE TRANSPOSITION    . UVULOPALATOPHARYNGOPLASTY      SOCIAL HISTORY:   Social History   Tobacco Use  . Smoking status:  Current Some Day Smoker    Types: Cigars  . Smokeless tobacco: Never Used  . Tobacco comment: smoke 1 at night occasionally;   Substance Use Topics  . Alcohol use: No    Comment: stopped 12 years ago    FAMILY HISTORY:   Family History  Problem Relation Age of Onset  . Diabetes Other   . Transient ischemic attack Father   . CAD Father     DRUG ALLERGIES:  No Known Allergies  REVIEW OF SYSTEMS:  CONSTITUTIONAL:  generalized weakness, fatigue getting worse  EYES: No blurred or double vision.  EARS, NOSE, AND THROAT: No tinnitus or ear pain.  RESPIRATORY: No cough, shortness of breath, wheezing or hemoptysis.  CARDIOVASCULAR: No chest pain, orthopnea, edema.  GASTROINTESTINAL: No nausea, vomiting, diarrhea or abdominal pain.  GENITOURINARY: No dysuria, hematuria.  ENDOCRINE: No polyuria, nocturia,  HEMATOLOGY: No anemia, easy bruising or bleeding SKIN: No rash or lesion. MUSCULOSKELETAL: No joint pain or arthritis.   NEUROLOGIC: No tingling, numbness, weakness.  PSYCHIATRY: No anxiety or depression.   MEDICATIONS AT HOME:   Prior to Admission medications   Medication Sig Start Date End Date Taking? Authorizing Provider  cephALEXin (KEFLEX) 500 MG capsule Take 1 capsule (500 mg total) by mouth every 8 (eight) hours. 12/18/17   Fritzi Mandes, MD  FERROUS SULFATE ER PO Take 325 mg by mouth 2 (two) times daily.     [provider]  furosemide (LASIX) 40 MG tablet Take 40 mg by mouth 2 (two) times daily.    [provider]  insulin aspart  protamine- aspart (NOVOLOG MIX 70/30) (70-30) 100 UNIT/ML injection Inject 0.2 mLs (20 Units total) into the skin 2 (two) times daily with a meal. 12/18/17   Fritzi Mandes, MD  lactulose (CHRONULAC) 10 GM/15ML solution Take 30 mLs (20 g total) by mouth 2 (two) times daily. 12/18/17   Fritzi Mandes, MD  LORazepam (ATIVAN) 0.5 MG tablet Take 0.5 mg by mouth 3 (three) times daily as needed for anxiety.     [provider]   Magnesium Oxide 400 (240 Mg) MG TABS Take 1 tablet (400 mg total) by mouth every morning. 12/05/17   Loletha Grayer, MD  Multiple Vitamin (MULTIVITAMIN) tablet Take 1 tablet by mouth daily.    [provider]  omeprazole (PRILOSEC) 40 MG capsule Take 1 capsule by mouth daily. 11/25/17   [provider]  pantoprazole (PROTONIX) 40 MG tablet Take 1 tablet (40 mg total) by mouth daily. 12/05/17 12/05/18  Loletha Grayer, MD  potassium chloride SA (K-DUR,KLOR-CON) 20 MEQ tablet Take 2 tablets (40 mEq total) by mouth 2 (two) times daily. 12/05/17   Loletha Grayer, MD  spironolactone (ALDACTONE) 25 MG tablet Take 1 tablet (25 mg total) by mouth 2 (two) times daily. 12/05/17   Loletha Grayer, MD  traMADol (ULTRAM) 50 MG tablet Take 50 mg by mouth every 8 (eight) hours as needed.     [provider]      VITAL SIGNS:  Blood pressure (!) 126/46, pulse 80, temperature 98.2 F (36.8 C), temperature source Oral, resp. rate 19, height 5\' 9"  (1.753 m), weight (!) 142.9 kg (315 lb), SpO2 98 %.  PHYSICAL EXAMINATION:  GENERAL:  67 y.o.-year-old patient lying in the bed with no acute distress.  Appears slightly pale EYES: Pupils equal, round, reactive to light and accommodation. No scleral icterus. Extraocular muscles intact.  HEENT: Head atraumatic, normocephalic. Oropharynx and nasopharynx clear.  NECK:  Supple, no jugular venous distention. No thyroid enlargement, no tenderness.  LUNGS: Normal breath sounds bilaterally, no wheezing, rales,rhonchi or crepitation. No use of accessory muscles of respiration.  CARDIOVASCULAR: S1, S2 normal. No murmurs, rubs, or gallops.  ABDOMEN: Soft, nontender, nondistended. Bowel sounds present. No organomegaly or mass.  EXTREMITIES: No pedal edema, cyanosis, or clubbing.  NEUROLOGIC: Cranial nerves II through XII are intact. Muscle strength 5/5 in all extremities. Sensation intact. Gait not checked.  PSYCHIATRIC: The patient is alert and oriented x  3.  SKIN: No obvious rash, lesion, or ulcer.   LABORATORY PANEL:   CBC Recent Labs  Lab 12/25/17 1346  WBC 4.9  HGB 6.5*  HCT 19.9*  PLT 131*   ------------------------------------------------------------------------------------------------------------------  Chemistries  Recent Labs  Lab 12/25/17 1346  NA 137  K 4.3  CL 107  CO2 21*  GLUCOSE 114*  BUN 29*  CREATININE 1.95*  CALCIUM 8.6*  AST 36  ALT 19  ALKPHOS 65  BILITOT 0.6   ------------------------------------------------------------------------------------------------------------------  Cardiac Enzymes No results for input(s): TROPONINI in the last 168 hours. ------------------------------------------------------------------------------------------------------------------  RADIOLOGY:  Dg Chest Portable 1 View  Result Date: 12/25/2017 CLINICAL DATA:  Generalize weakness, increased work of breathing. History of anemia requiring blood transfusions due to known gastrointestinal bleeding process. EXAM: PORTABLE CHEST 1 VIEW COMPARISON:  Chest x-ray of December 15, 2017 FINDINGS: The lungs are adequately inflated. There is no focal infiltrate. The interstitial markings are coarse though stable. The cardiac silhouette is enlarged but also stable. The pulmonary vascularity is mildly prominent centrally. The mediastinum is normal in width. There is no pleural effusion.  The bony thorax is unremarkable. IMPRESSION: Chronic enlargement of cardiac silhouette with mild interstitial prominence. The findings could reflect low-grade compensated CHF. There is no pulmonary edema, pneumonia, or pleural effusion. Electronically Signed   By: David  Martinique M.D.   On: 12/25/2017 14:40    EKG:   Orders placed or performed during the hospital encounter of 12/25/17  . ED EKG  . ED EKG    IMPRESSION AND PLAN:   67 year old male patient who is admitted 8 times in the last 6 months for recurrent anemia requiring EGDs, blood transfusions  comes today because of worsening generalized weakness, ambulatory difficulty and requiring support of cane. 1.  Symptomatic anemia, hemoglobin 6.5 today and hemoglobin 7.1 a week ago.  Patient will get 2 units of packed RBC.  Blood bank issued 1 unit of packed RBC, check repeat CBC after 1 unit of transfusion and see the need for a second unit of transfusion based on repeat hemoglobin. 2.  Chronic kidney disease stage III: Stable creatinine around 1.95. History of liver cirrhosis, patient is on Lasix, Aldactone. 4.  History of recurrent GI bleeds, long segment Barrett's esophagus, recent EGD in March on March 6 showed blood in stomach status post laser.  Patient has history of chronic blood loss anemia.  Referred to [facility], appointment on this Friday. 4.  History of diabetes type 2: Continue home dose insulin, patient says that he does not want to change Dosing. 5.  History of hepatic encephalopathy.  Patient is alert, awake, oriented, he says that he has a does not want to take lactulose while in the hospital and does not want ammonia level also check and he wants to go home after blood transfusion tomorrow.  Blood ordered. 6.  Iron deficiency anemia: Continue current ironsupplements..    All the records are reviewed and case discussed with ED provider. Management plans discussed with the patient, family and they are in agreement.  CODE STATUS: full code  TOTAL TIME TAKING CARE OF THIS PATIENT: 55 minutes.    Epifanio Lesches M.D on 12/25/2017 at 3:23 PM  Between 7am to 6pm - Pager - [phone]  After 6pm go to [email or web address] - password [email or web address] [facility]  Office  [phone]  CC: Primary care physician; Baxter Hire, MD  Note: This dictation was prepared with Dragon dictation along with smaller phrase technology. Any transcriptional errors that result from this process are unintentional.

## 2017-12-25 NOTE — Progress Notes (Signed)
Patient has severe cellulitis in bilateral legs. Put in wound care consult.  Unsure about how to wrap legs.

## 2017-12-25 NOTE — ED Triage Notes (Signed)
Pt to ED reporting generalized weakness with increased WOB. PT is pale upon arrival and reports a known GI bleed that he has been having weekly blood transfusions to control. Pt missed his transfusion on Monday. Reports the weakness feels similar to when he is anemic.

## 2017-12-25 NOTE — Progress Notes (Signed)
Family Meeting Note  Advance Directive:yes  Today a meeting took place with the Patient.  The following clinical team members were present during this meeting:MD  And wants to be full code and he wants to be resuscitated. told the patient prognosis poor because of multiple medical problems of recurrent anemias, liver cirrhosis, diabetes mellitus type 2, chronic GI bleeds, chronic kidney disease.  He is aware and he wants to be full code and he wants to be resuscitated if something happens to him.  Time spent during discussion:15 minutes  Epifanio Lesches, MD

## 2017-12-26 DIAGNOSIS — D500 Iron deficiency anemia secondary to blood loss (chronic): Secondary | ICD-10-CM | POA: Diagnosis not present

## 2017-12-26 LAB — BASIC METABOLIC PANEL
ANION GAP: 9 (ref 5–15)
BUN: 30 mg/dL — AB (ref 6–20)
CHLORIDE: 107 mmol/L (ref 101–111)
CO2: 24 mmol/L (ref 22–32)
Calcium: 8.7 mg/dL — ABNORMAL LOW (ref 8.9–10.3)
Creatinine, Ser: 2.03 mg/dL — ABNORMAL HIGH (ref 0.61–1.24)
GFR calc Af Amer: 38 mL/min — ABNORMAL LOW (ref >60)
GFR, EST NON AFRICAN AMERICAN: 33 mL/min — AB (ref >60)
GLUCOSE: 137 mg/dL — AB (ref 65–99)
POTASSIUM: 4.4 mmol/L (ref 3.5–5.1)
Sodium: 140 mmol/L (ref 135–145)

## 2017-12-26 LAB — CBC
HEMATOCRIT: 21.4 % — AB (ref 40.0–52.0)
Hemoglobin: 7.1 g/dL — ABNORMAL LOW (ref 13.0–18.0)
MCH: 30.6 pg (ref 26.0–34.0)
MCHC: 33.1 g/dL (ref 32.0–36.0)
MCV: 92.4 fL (ref 80.0–100.0)
Platelets: 126 10*3/uL — ABNORMAL LOW (ref 150–440)
RBC: 2.32 MIL/uL — ABNORMAL LOW (ref 4.40–5.90)
RDW: 17.3 % — AB (ref 11.5–14.5)
WBC: 4.1 10*3/uL (ref 3.8–10.6)

## 2017-12-26 LAB — GLUCOSE, CAPILLARY
Glucose-Capillary: 114 mg/dL — ABNORMAL HIGH (ref 65–99)
Glucose-Capillary: 125 mg/dL — ABNORMAL HIGH (ref 65–99)

## 2017-12-26 LAB — PREPARE RBC (CROSSMATCH)

## 2017-12-26 MED ORDER — SODIUM CHLORIDE 0.9 % IV SOLN
Freq: Once | INTRAVENOUS | Status: AC
  Administered 2017-12-26: 03:00:00 via INTRAVENOUS

## 2017-12-26 MED ORDER — ACETAMINOPHEN 325 MG PO TABS
650.0000 mg | ORAL_TABLET | Freq: Once | ORAL | Status: AC
  Administered 2017-12-26: 650 mg via ORAL
  Filled 2017-12-26: qty 2

## 2017-12-26 MED ORDER — FUROSEMIDE 10 MG/ML IJ SOLN
20.0000 mg | INTRAMUSCULAR | Status: DC | PRN
  Administered 2017-12-26: 20 mg via INTRAVENOUS
  Filled 2017-12-26: qty 2

## 2017-12-26 NOTE — Consult Note (Signed)
[place] Nurse wound consult note Reason for Consult:Well known to [facility] team.  Long standing venous insufficiency.  Home health for compression twice weekly.  Seen at wound care center as well.  Patient is anticipating discharge so he can go to [facility] for recurrent GI bleeds Wound type: intact skin to bilateral lower legs with chronic skin changes.   Pressure Injury POA: NA Measurement: n/a Wound FBP:ZWCHEN Drainage (amount, consistency, odor) none noted Periwound:intact skin   Dressing procedure/placement/frequency: Cleanse bilateral lower legs with soap and water and pat dry.  Apply zinc layer from below toes to below knee with 50% overlap.  Secure with self adherent coban.  Change twice weekly on TUesday and Thursday.  [place] team will follow.  Domenic Moras RN BSN [place] Pager [phone]

## 2017-12-26 NOTE — Plan of Care (Signed)
  Problem: Skin Integrity: Goal: Risk for impaired skin integrity will decrease Outcome: Progressing   

## 2017-12-26 NOTE — Discharge Instructions (Signed)
Weakness  Weakness is a lack of strength. You may feel weak all over your body (generalized), or you may feel weak in one specific part of your body (focal). Common causes of weakness include:   Infection and immune system disorders.   Physical exhaustion.   Internal bleeding or other blood loss that results in a lack of red blood cells (anemia).   Dehydration.   An imbalance in mineral (electrolyte) levels, such as potassium.   Heart disease, circulation problems, or stroke.    Other causes include:   Some medicines or cancer treatment.   Stress, anxiety, or depression.   Nervous system disorders.   Thyroid disorders.   Loss of muscle strength because of age or inactivity.   Poor sleep quality or sleep disorders.    The cause of your weakness may not be known. Some causes of weakness can be serious, so it is important to see your health care provider.  Follow these instructions at home:   Rest as needed.   Try to get enough sleep. Talk to your health care provider about how much sleep you need each night.   Take over-the-counter and prescription medicines only as told by your health care provider.   Eat a healthy, well-balanced diet. This includes:  ? Proteins to build muscles, such as lean meats and fish.  ? Fresh fruits and vegetables.  ? Carbohydrates to boost energy, such as whole grains.   Drink enough fluid to keep your urine clear or pale yellow.   Do strength exercises, such as arm curls and leg raises, for 30 minutes at least 2 days a week or as told by your health care provider.   Consider working with a physical therapist or trainer who can develop an exercise plan to help you gain muscle strength.   Keep all follow-up visits as told by your health care provider. This is important.  Contact a health care provider if:   Your weakness does not improve or gets worse.   Your weakness affects your ability to think clearly.   Your weakness affects your ability to do your normal daily  activities.  Get help right away if:   You develop sudden weakness.   You have trouble breathing or shortness of breath.   You have problems with your vision.   You have trouble talking or swallowing.   You have trouble standing or walking.   You have chest pain.   You are light-headed or lose consciousness.  This information is not intended to replace advice given to you by your health care provider. Make sure you discuss any questions you have with your health care provider.  Document Released: 09/17/2005 Document Revised: 10/13/2015 Document Reviewed: 07/08/2015  Elsevier Interactive Patient Education  2018 [employer]

## 2017-12-26 NOTE — Progress Notes (Signed)
MD order for 2 more units of blood. Pt when first came in only scheduled for 2.If after the first, hgb 8, not to transfuse the second.  Hgb from 6.5 to 6.2. Not a crossmatch for the second unit, called MD. With hgb, MD wanted pt to have another unit in order to bring  hgb up to at least 8. Explained to Pt, about transfusions to bring hgb up to 8, pt stated that just wanted the 2 units and to go home, because of scheduled appointment at [facility] on Friday.

## 2017-12-26 NOTE — Care Management Obs Status (Signed)
[place] NOTIFICATION   Patient Details  Name: Corey West MRN: [id number] Date of Birth: [birth year]   Medicare Observation Status Notification Given:  Yes    Katrina Stack, RN 12/26/2017, 10:46 AM

## 2017-12-26 NOTE — Care Management (Signed)
Placed in observation due to need for transfusions due to gib. Ninth presentation.  Is to be seen at [facility] tomorrow for gi consult for recurrent gi bleeds. Order present for transfusion of 2 more units. he is adamant about discharging home today and questions when the wraps are going to be placed back on his legs.  Updated primary nurse on patient's concerns.  Patient is currently followed by [facility].  Notified agency of observation status

## 2017-12-26 NOTE — Discharge Summary (Signed)
[facility] - [place] at [facility], 67 y.o., DOB 01-27-1951, MRN [id number]. Admission date: 12/25/2017 Discharge Date 12/26/2017 Primary MD Baxter Hire, MD Admitting Physician Epifanio Lesches, MD  Admission Diagnosis   1.  Symptomatic anemia 2.Chronic kidney disease stage III 3.  History of recurrent GI bleeds with long segment Barrett's esophagus 4.  Type 2 diabetes mellitus 5.  Iron deficiency anemia   Discharge Diagnosis       1.  Anemia status post PRBC transfusion 2.  Chronic kidney disease stage III 3.  Diabetes mellitus type 2 4.  Iron deficiency anemia  Hospital Course  67 year old male patient with history of anemia, iron deficiency, chronic kidney disease stage III, recurrent GI bleeds in the past with long segment Barrett's esophagus is due for procedure at [facility] tomorrow was admitted on 12/25/2017 under observation bed for anemia.  Hemoglobin was 6.5. Patient received 2 units of PRBC transfusion intravenously.  Patient tolerated blood transfusion well.  Hemoglobin at the time of discharge was 7.1 Patient hemodynamically stable and will be discharged home follow-up at [place]  None  Significant Tests:  See full reports for all details    Dg Chest 2 View  Result Date: 12/02/2017 CLINICAL DATA:  Cough and shortness of Breath EXAM: CHEST  2 VIEW COMPARISON:  11/22/2017 FINDINGS: Cardiac shadow is stable. The lungs are well aerated bilaterally. No focal infiltrate or sizable effusion is seen. Degenerative changes of the thoracic spine are noted. IMPRESSION: No active cardiopulmonary disease. Electronically Signed   By: Inez Catalina M.D.   On: 12/02/2017 12:52   Ct Head Wo Contrast  Result Date: 12/15/2017 CLINICAL DATA:  Altered level of consciousness. EXAM: CT HEAD WITHOUT CONTRAST TECHNIQUE: Contiguous axial images were obtained from the base of the skull through the vertex without intravenous contrast.  COMPARISON:  Head CT 10/17/2017 FINDINGS: Brain: Mild atrophy is unchanged from prior exam. No intracranial hemorrhage, mass effect, or midline shift. No hydrocephalus. The basilar cisterns are patent. No evidence of territorial infarct or acute ischemia. No extra-axial or intracranial fluid collection. Vascular: No hyperdense vessel or unexpected calcification. Skull: No fracture or focal lesion. Sinuses/Orbits: Paranasal sinuses and mastoid air cells are clear. The visualized orbits are unremarkable. Other: None. IMPRESSION: 1.  No acute intracranial abnormality. 2. Mild atrophy is unchanged from prior. Electronically Signed   By: Jeb Levering M.D.   On: 12/15/2017 23:57   Dg Chest Portable 1 View  Result Date: 12/25/2017 CLINICAL DATA:  Generalize weakness, increased work of breathing. History of anemia requiring blood transfusions due to known gastrointestinal bleeding process. EXAM: PORTABLE CHEST 1 VIEW COMPARISON:  Chest x-ray of December 15, 2017 FINDINGS: The lungs are adequately inflated. There is no focal infiltrate. The interstitial markings are coarse though stable. The cardiac silhouette is enlarged but also stable. The pulmonary vascularity is mildly prominent centrally. The mediastinum is normal in width. There is no pleural effusion. The bony thorax is unremarkable. IMPRESSION: Chronic enlargement of cardiac silhouette with mild interstitial prominence. The findings could reflect low-grade compensated CHF. There is no pulmonary edema, pneumonia, or pleural effusion. Electronically Signed   By: David  Martinique M.D.   On: 12/25/2017 14:40   Dg Chest Port 1 View  Result Date: 12/16/2017 CLINICAL DATA:  Confusion/altered mental status EXAM: PORTABLE CHEST 1 VIEW COMPARISON:  December 02, 2017 FINDINGS: There is no edema or consolidation. Heart is mildly enlarged with pulmonary vascularity within normal limits. No adenopathy.  No bone lesions. No pneumothorax. IMPRESSION: Stable cardiac prominence.  No  edema or consolidation. Electronically Signed   By: Lowella Grip III M.D.   On: 12/16/2017 00:00       Today   Subjective:   Corey West is a 67 year old male patient with history of anemia, iron deficiency, type 2 diabetes mellitus, recurrent GI bleeds with long segment Barrett's esophagus was admitted on 12/25/2017 for symptomatic anemia. Patient seen and evaluated today No complaints of any shortness of breath No fever and chills, no chest pain No Abdominal pain  Objective:   Blood pressure (!) 141/61, pulse 68, temperature 98.5 F (36.9 C), temperature source Oral, resp. rate 20, height 5\' 9"  (1.753 m), weight (!) 139.7 kg (307 lb 14.4 oz), SpO2 98 %.  .  Intake/Output Summary (Last 24 hours) at 12/26/2017 1235 Last data filed at 12/26/2017 0619 Gross per 24 hour  Intake 800 ml  Output 450 ml  Net 350 ml    Exam VITAL SIGNS: Blood pressure (!) 141/61, pulse 68, temperature 98.5 F (36.9 C), temperature source Oral, resp. rate 20, height 5\' 9"  (1.753 m), weight (!) 139.7 kg (307 lb 14.4 oz), SpO2 98 %.  GENERAL:  67 y.o.-year-old patient lying in the bed with no acute distress.  EYES: Pupils equal, round, reactive to light and accommodation. No scleral icterus. Extraocular muscles intact. Pallor present.  HEENT: Head atraumatic, normocephalic. Oropharynx and nasopharynx clear.  NECK:  Supple, no jugular venous distention. No thyroid enlargement, no tenderness.  LUNGS: Normal breath sounds bilaterally, no wheezing, rales,rhonchi or crepitation. No use of accessory muscles of respiration.  CARDIOVASCULAR: S1, S2 normal. No murmurs, rubs, or gallops.  ABDOMEN: Soft, nontender, nondistended. Bowel sounds present. No organomegaly or mass.  EXTREMITIES: No pedal edema, cyanosis, or clubbing.  NEUROLOGIC: Cranial nerves II through XII are intact. Muscle strength 5/5 in all extremities. Sensation intact. Gait not checked.  PSYCHIATRIC: The patient is alert and oriented x 3.   SKIN: No obvious rash, lesion, or ulcer.   Data Review     CBC w Diff:  Lab Results  Component Value Date   WBC 4.1 12/26/2017   HGB 7.1 (L) 12/26/2017   HGB 9.7 (L) 09/14/2014   HCT 21.4 (L) 12/26/2017   HCT 28.2 (L) 09/14/2014   PLT 126 (L) 12/26/2017   PLT 106 (L) 09/14/2014   LYMPHOPCT 11 09/05/2017   LYMPHOPCT 16.2 09/14/2014   MONOPCT 6 09/05/2017   MONOPCT 6.2 09/14/2014   EOSPCT 3 09/05/2017   EOSPCT 3.9 09/14/2014   BASOPCT 0 09/05/2017   BASOPCT 0.6 09/14/2014   CMP:  Lab Results  Component Value Date   NA 140 12/26/2017   NA 138 09/14/2014   K 4.4 12/26/2017   K 3.4 (L) 09/14/2014   CL 107 12/26/2017   CL 104 09/14/2014   CO2 24 12/26/2017   CO2 28 09/14/2014   BUN 30 (H) 12/26/2017   BUN 18 09/14/2014   CREATININE 2.03 (H) 12/26/2017   CREATININE 2.05 (H) 09/14/2014   PROT 6.6 12/25/2017   PROT 6.4 09/11/2014   ALBUMIN 2.6 (L) 12/25/2017   ALBUMIN 2.4 (L) 09/11/2014   BILITOT 0.6 12/25/2017   BILITOT 1.0 09/11/2014   ALKPHOS 65 12/25/2017   ALKPHOS 52 09/11/2014   AST 36 12/25/2017   AST 18 09/11/2014   ALT 19 12/25/2017   ALT 18 09/11/2014  .  Micro Results No results found for this or any previous visit (from the past 240 hour(s)).  Code Status Orders  (From admission, onward)        Start     Ordered   12/25/17 1455  Full code  Continuous     12/25/17 1456    Code Status History    Date Active Date Inactive Code Status Order ID Comments User Context   12/16/2017 0921 12/18/2017 1635 DNR [id number]  Max Sane, MD Inpatient   12/16/2017 0305 12/16/2017 0921 Full Code [id number]  Salary, Avel Peace, MD ED   12/02/2017 2003 12/05/2017 2000 Full Code [id number]  Saundra Shelling, MD Inpatient   11/21/2017 1948 11/23/2017 1958 Full Code [id number]  Loletha Grayer, MD ED   10/23/2017 1405 10/26/2017 1402 Full Code [id number]  Bettey Costa, MD Inpatient   10/18/2017 0058 10/19/2017 1806 Full Code [id number]  Saundra Shelling, MD Inpatient   10/09/2017  2139 10/11/2017 1806 Full Code [id number]  [name], MD Inpatient   09/03/2017 2124 09/05/2017 1552 Full Code [id number]  Nicholes Mango, MD Inpatient   06/26/2017 1119 06/28/2017 1748 Full Code [id number]  Henreitta Leber, MD Inpatient   02/21/2017 2007 02/22/2017 1803 Full Code [id number]  [name], MD Inpatient   07/20/2015 1245 07/22/2015 2217 Full Code [id number]  Nicholes Mango, MD Inpatient   02/08/2015 2331 02/11/2015 1943 Full Code [id number]  Hower, Aaron Mose, MD ED          Follow-up Information    Baxter Hire, MD Follow up in 1 week(s).   Specialty:  Internal Medicine Contact information: [place] [address] [phone]           Discharge Medications   Allergies as of 12/26/2017   No Known Allergies     Medication List    TAKE these medications   CENTRUM SILVER 50+MEN Tabs Take 1 tablet by mouth daily.   cephALEXin 500 MG capsule Commonly known as:  KEFLEX Take 1 capsule (500 mg total) by mouth every 8 (eight) hours.   FERROUS SULFATE ER PO Take 325 mg by mouth 2 (two) times daily.   furosemide 40 MG tablet Commonly known as:  LASIX Take 40 mg by mouth 2 (two) times daily.   insulin aspart protamine- aspart (70-30) 100 UNIT/ML injection Commonly known as:  NOVOLOG MIX 70/30 Inject 0.2 mLs (20 Units total) into the skin 2 (two) times daily with a meal. What changed:    how much to take  how to take this  when to take this  additional instructions   lactulose 10 GM/15ML solution Commonly known as:  CHRONULAC Take 30 mLs (20 g total) by mouth 2 (two) times daily.   LORazepam 0.5 MG tablet Commonly known as:  ATIVAN Take 0.5 mg by mouth 3 (three) times daily as needed for anxiety.   Magnesium Oxide 400 (240 Mg) MG Tabs Take 1 tablet (400 mg total) by mouth every morning.   omeprazole 40 MG capsule Commonly known as:  PRILOSEC Take 1 capsule by mouth 2 (two) times daily.   pantoprazole 40 MG  tablet Commonly known as:  PROTONIX Take 1 tablet (40 mg total) by mouth daily.   potassium chloride SA 20 MEQ tablet Commonly known as:  K-DUR,KLOR-CON Take 2 tablets (40 mEq total) by mouth 2 (two) times daily.   rifaximin 550 MG Tabs tablet Commonly known as:  XIFAXAN Take 550 mg by mouth 2 (two) times daily.   spironolactone 25 MG tablet Commonly known as:  ALDACTONE Take 1 tablet (25 mg total) by mouth 2 (two) times  daily.   traMADol 50 MG tablet Commonly known as:  ULTRAM Take 50 mg by mouth every 8 (eight) hours as needed.          Total Time in preparing paper work, data evaluation and todays exam - 35 minutes  Saundra Shelling M.D on 12/26/2017 at 12:35 PM [place]  [phone]

## 2017-12-26 NOTE — Progress Notes (Signed)
Notified MD of needing cross match order in computer to transfuse second unit of blood. Orders placed

## 2017-12-27 LAB — TYPE AND SCREEN
ABO/RH(D): A NEG
Antibody Screen: NEGATIVE
UNIT DIVISION: 0
Unit division: 0

## 2017-12-27 LAB — BPAM RBC
BLOOD PRODUCT EXPIRATION DATE: 201904032359
Blood Product Expiration Date: 201904052359
ISSUE DATE / TIME: 201903271806
ISSUE DATE / TIME: 201903280310
UNIT TYPE AND RH: 600
Unit Type and Rh: 600

## 2017-12-30 ENCOUNTER — Other Ambulatory Visit: Payer: Self-pay

## 2017-12-30 ENCOUNTER — Emergency Department
Admission: EM | Admit: 2017-12-30 | Discharge: 2017-12-30 | Disposition: A | Discharge status: Home / Self Care | Payer: Medicare Other | Source: Home / Self Care | Attending: Emergency Medicine | Admitting: Emergency Medicine

## 2017-12-30 ENCOUNTER — Encounter: Payer: Self-pay | Admitting: Emergency Medicine

## 2017-12-30 DIAGNOSIS — F419 Anxiety disorder, unspecified: Secondary | ICD-10-CM | POA: Diagnosis not present

## 2017-12-30 DIAGNOSIS — F17290 Nicotine dependence, other tobacco product, uncomplicated: Secondary | ICD-10-CM | POA: Diagnosis not present

## 2017-12-30 DIAGNOSIS — D61818 Other pancytopenia: Secondary | ICD-10-CM | POA: Diagnosis not present

## 2017-12-30 DIAGNOSIS — N183 Chronic kidney disease, stage 3 (moderate): Secondary | ICD-10-CM | POA: Diagnosis not present

## 2017-12-30 DIAGNOSIS — E785 Hyperlipidemia, unspecified: Secondary | ICD-10-CM | POA: Diagnosis not present

## 2017-12-30 DIAGNOSIS — G4730 Sleep apnea, unspecified: Secondary | ICD-10-CM | POA: Diagnosis not present

## 2017-12-30 DIAGNOSIS — Z794 Long term (current) use of insulin: Secondary | ICD-10-CM | POA: Diagnosis not present

## 2017-12-30 DIAGNOSIS — K7460 Unspecified cirrhosis of liver: Secondary | ICD-10-CM | POA: Diagnosis not present

## 2017-12-30 DIAGNOSIS — E1121 Type 2 diabetes mellitus with diabetic nephropathy: Secondary | ICD-10-CM | POA: Diagnosis not present

## 2017-12-30 DIAGNOSIS — K7290 Hepatic failure, unspecified without coma: Secondary | ICD-10-CM | POA: Diagnosis not present

## 2017-12-30 DIAGNOSIS — K219 Gastro-esophageal reflux disease without esophagitis: Secondary | ICD-10-CM | POA: Diagnosis not present

## 2017-12-30 DIAGNOSIS — D62 Acute posthemorrhagic anemia: Secondary | ICD-10-CM | POA: Diagnosis not present

## 2017-12-30 DIAGNOSIS — Z8249 Family history of ischemic heart disease and other diseases of the circulatory system: Secondary | ICD-10-CM | POA: Diagnosis not present

## 2017-12-30 DIAGNOSIS — D649 Anemia, unspecified: Secondary | ICD-10-CM | POA: Insufficient documentation

## 2017-12-30 DIAGNOSIS — Z6841 Body Mass Index (BMI) 40.0 and over, adult: Secondary | ICD-10-CM | POA: Diagnosis not present

## 2017-12-30 DIAGNOSIS — I872 Venous insufficiency (chronic) (peripheral): Secondary | ICD-10-CM | POA: Diagnosis not present

## 2017-12-30 DIAGNOSIS — E1122 Type 2 diabetes mellitus with diabetic chronic kidney disease: Secondary | ICD-10-CM

## 2017-12-30 DIAGNOSIS — N189 Chronic kidney disease, unspecified: Secondary | ICD-10-CM

## 2017-12-30 DIAGNOSIS — K920 Hematemesis: Secondary | ICD-10-CM | POA: Insufficient documentation

## 2017-12-30 DIAGNOSIS — I878 Other specified disorders of veins: Secondary | ICD-10-CM | POA: Diagnosis not present

## 2017-12-30 DIAGNOSIS — I129 Hypertensive chronic kidney disease with stage 1 through stage 4 chronic kidney disease, or unspecified chronic kidney disease: Secondary | ICD-10-CM | POA: Insufficient documentation

## 2017-12-30 DIAGNOSIS — Z823 Family history of stroke: Secondary | ICD-10-CM | POA: Diagnosis not present

## 2017-12-30 DIAGNOSIS — K31819 Angiodysplasia of stomach and duodenum without bleeding: Secondary | ICD-10-CM | POA: Diagnosis not present

## 2017-12-30 DIAGNOSIS — E876 Hypokalemia: Secondary | ICD-10-CM | POA: Diagnosis not present

## 2017-12-30 DIAGNOSIS — D500 Iron deficiency anemia secondary to blood loss (chronic): Secondary | ICD-10-CM

## 2017-12-30 LAB — CBC WITH DIFFERENTIAL/PLATELET
BASOS ABS: 0 10*3/uL (ref 0–0.1)
BASOS PCT: 0 %
Eosinophils Absolute: 0 10*3/uL (ref 0–0.7)
Eosinophils Relative: 1 %
HEMATOCRIT: 22 % — AB (ref 40.0–52.0)
HEMOGLOBIN: 7.1 g/dL — AB (ref 13.0–18.0)
Lymphocytes Relative: 8 %
Lymphs Abs: 0.4 10*3/uL — ABNORMAL LOW (ref 1.0–3.6)
MCH: 30.8 pg (ref 26.0–34.0)
MCHC: 32.4 g/dL (ref 32.0–36.0)
MCV: 95.1 fL (ref 80.0–100.0)
Monocytes Absolute: 0.4 10*3/uL (ref 0.2–1.0)
Monocytes Relative: 9 %
NEUTROS ABS: 3.5 10*3/uL (ref 1.4–6.5)
NEUTROS PCT: 82 %
Platelets: 119 10*3/uL — ABNORMAL LOW (ref 150–440)
RBC: 2.31 MIL/uL — ABNORMAL LOW (ref 4.40–5.90)
RDW: 17.4 % — ABNORMAL HIGH (ref 11.5–14.5)
WBC: 4.3 10*3/uL (ref 3.8–10.6)

## 2017-12-30 LAB — URINALYSIS, COMPLETE (UACMP) WITH MICROSCOPIC
BACTERIA UA: NONE SEEN
BILIRUBIN URINE: NEGATIVE
Glucose, UA: NEGATIVE mg/dL
Hgb urine dipstick: NEGATIVE
Ketones, ur: NEGATIVE mg/dL
LEUKOCYTES UA: NEGATIVE
Nitrite: NEGATIVE
Protein, ur: NEGATIVE mg/dL
Specific Gravity, Urine: 1.01 (ref 1.005–1.030)
pH: 7 (ref 5.0–8.0)

## 2017-12-30 LAB — TYPE AND SCREEN
ABO/RH(D): A NEG
ANTIBODY SCREEN: NEGATIVE

## 2017-12-30 LAB — COMPREHENSIVE METABOLIC PANEL
ALBUMIN: 2.8 g/dL — AB (ref 3.5–5.0)
ALK PHOS: 71 U/L (ref 38–126)
ALT: 19 U/L (ref 17–63)
AST: 29 U/L (ref 15–41)
Anion gap: 6 (ref 5–15)
BUN: 29 mg/dL — AB (ref 6–20)
CALCIUM: 8.8 mg/dL — AB (ref 8.9–10.3)
CO2: 23 mmol/L (ref 22–32)
CREATININE: 2.03 mg/dL — AB (ref 0.61–1.24)
Chloride: 109 mmol/L (ref 101–111)
GFR calc Af Amer: 38 mL/min — ABNORMAL LOW (ref >60)
GFR calc non Af Amer: 33 mL/min — ABNORMAL LOW (ref >60)
GLUCOSE: 134 mg/dL — AB (ref 65–99)
Potassium: 4.8 mmol/L (ref 3.5–5.1)
Sodium: 138 mmol/L (ref 135–145)
TOTAL PROTEIN: 6.8 g/dL (ref 6.5–8.1)
Total Bilirubin: 0.5 mg/dL (ref 0.3–1.2)

## 2017-12-30 LAB — TROPONIN I: Troponin I: <0.03 ng/mL (ref <0.03)

## 2017-12-30 LAB — GLUCOSE, CAPILLARY: Glucose-Capillary: 116 mg/dL — ABNORMAL HIGH (ref 65–99)

## 2017-12-30 NOTE — ED Triage Notes (Signed)
Pt presents to ED via [facility] from home c/o GI bleeding with weakness today. Pt scheduled for lower endoscopy with possible cauterization this Wednesday. States he typically needs blood transfusions weekly and is usually able to see his PCP for it but was unable to go there today d/t weakness.

## 2017-12-30 NOTE — ED Notes (Signed)
Pt given d/c papers. Pt is NAD VSS and agreeable to discharge. Pt is awaiting EMS at this time.

## 2017-12-30 NOTE — ED Notes (Signed)
Notified Lattie Haw of need for transport. EMS notified

## 2017-12-30 NOTE — ED Provider Notes (Signed)
[facility] Emergency Department Provider Note       Time seen: ----------------------------------------- 8:39 AM on 12/30/2017 -----------------------------------------   I have reviewed the triage vital signs and the nursing notes.  HISTORY   Chief Complaint GI Bleeding    HPI Corey West is a 67 y.o. male with a history of anemia, arthritis, AVM, Barrett's esophagus, chronic kidney disease, cirrhosis, depression, diabetes, esophageal varices, hyperlipidemia and hypertension who presents to the ED for possible anemia.  Patient states he been feeling weak, has black stools from taking iron but he states they look darker than normal.  He has known history of GI bleeding and he is scheduled for a procedure soon to try to cauterize the bleeding areas in his GI tract or have surgery.  This will be done at [facility].  He denies fevers, chills or other complaints other than weakness.  Past Medical History:  Diagnosis Date  . Anemia   . Anxiety    controlled;   . Arthritis   . AVM (arteriovenous malformation) of stomach, acquired with hemorrhage   . Barrett's esophagus   . Chronic kidney disease    renal infufficiency  . Cirrhosis ([place])   . Depression    controlled;   Marland Kitchen Diabetes mellitus without complication ([place])    not controlled, taking insulin but sugar continues to run high;   . Edema   . Esophageal varices ([place])   . GERD (gastroesophageal reflux disease)   . History of hiatal hernia   . Hyperlipidemia   . Hypertension    controlled well;   . 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 Obesity   . Pancytopenia ([place])   . Polyp, stomach    with chronic blood loss  . Sleep apnea    does not wear a cpap, Medicare would not pay for it   . Venous stasis dermatitis of both lower extremities   . Venous stasis of both lower extremities    with cellulitis    Patient Active Problem List   Diagnosis Date Noted  . Goals of care, counseling/discussion   . Palliative  care encounter   . Hepatic encephalopathy syndrome ([place]) 12/16/2017  . Hepatic encephalopathy ([place]) 10/18/2017  . Hypokalemia 10/09/2017  . Hyperglycemia 10/09/2017  . Symptomatic anemia 06/26/2017  . Sepsis ([place]) 06/12/2017  . Cellulitis in diabetic foot ([place]) 02/21/2017  . Uncontrolled diabetes mellitus ([place]) 02/21/2017  . Cellulitis 02/21/2017  . GI bleed 07/20/2015  . Hematemesis 02/08/2015  . Cirrhosis ([place]) 02/08/2015  . Esophageal varices ([place]) 02/08/2015  . Type 2 diabetes mellitus ([place]) 02/08/2015  . Gastroesophageal reflux disease 02/08/2015  . Hyperlipidemia 02/08/2015  . Iron deficiency anemia 12/04/2013  . Thrombocytopenia ([place]) 12/04/2013  . Splenomegaly 11/04/2013    Past Surgical History:  Procedure Laterality Date  . ESOPHAGOGASTRODUODENOSCOPY N/A 02/09/2015   Procedure: ESOPHAGOGASTRODUODENOSCOPY (EGD);  Surgeon: Manya Silvas, MD;  Location[facility] ENDOSCOPY;  Service: Endoscopy;  Laterality: N/A;  . ESOPHAGOGASTRODUODENOSCOPY N/A 07/22/2015   Procedure: ESOPHAGOGASTRODUODENOSCOPY (EGD);  Surgeon: Manya Silvas, MD;  Location: [facility] ENDOSCOPY;  Service: Endoscopy;  Laterality: N/A;  . ESOPHAGOGASTRODUODENOSCOPY N/A 06/26/2017   Procedure: ESOPHAGOGASTRODUODENOSCOPY (EGD);  Surgeon: Manya Silvas, MD;  Location: [facility] ENDOSCOPY;  Service: Endoscopy;  Laterality: N/A;  . ESOPHAGOGASTRODUODENOSCOPY N/A 06/27/2017   Procedure: ESOPHAGOGASTRODUODENOSCOPY (EGD);  Surgeon: Manya Silvas, MD;  Location: [facility] ENDOSCOPY;  Service: Endoscopy;  Laterality: N/A;  . ESOPHAGOGASTRODUODENOSCOPY N/A 06/28/2017   Procedure: ESOPHAGOGASTRODUODENOSCOPY (EGD);  Surgeon: Manya Silvas, MD;  Location: [facility] ENDOSCOPY;  Service: Endoscopy;  Laterality: N/A;  . ESOPHAGOGASTRODUODENOSCOPY Bilateral 10/11/2017   Procedure: ESOPHAGOGASTRODUODENOSCOPY (EGD);  Surgeon: Manya Silvas, MD;  Location: [facility] ENDOSCOPY;  Service: Endoscopy;  Laterality: Bilateral;  .  ESOPHAGOGASTRODUODENOSCOPY N/A 12/04/2017   Procedure: ESOPHAGOGASTRODUODENOSCOPY (EGD);  Surgeon: Manya Silvas, MD;  Location: [facility] ENDOSCOPY;  Service: Endoscopy;  Laterality: N/A;  . ESOPHAGOGASTRODUODENOSCOPY (EGD) WITH PROPOFOL N/A 07/20/2015   Procedure: ESOPHAGOGASTRODUODENOSCOPY (EGD) WITH PROPOFOL;  Surgeon: Manya Silvas, MD;  Location: [facility] ENDOSCOPY;  Service: Endoscopy;  Laterality: N/A;  . ESOPHAGOGASTRODUODENOSCOPY (EGD) WITH PROPOFOL N/A 09/16/2015   Procedure: ESOPHAGOGASTRODUODENOSCOPY (EGD) WITH PROPOFOL;  Surgeon: Manya Silvas, MD;  Location: [facility] ENDOSCOPY;  Service: Endoscopy;  Laterality: N/A;  . ESOPHAGOGASTRODUODENOSCOPY (EGD) WITH PROPOFOL N/A 03/16/2016   Procedure: ESOPHAGOGASTRODUODENOSCOPY (EGD) WITH PROPOFOL;  Surgeon: Manya Silvas, MD;  Location: [facility] ENDOSCOPY;  Service: Endoscopy;  Laterality: N/A;  . ESOPHAGOGASTRODUODENOSCOPY (EGD) WITH PROPOFOL N/A 09/14/2016   Procedure: ESOPHAGOGASTRODUODENOSCOPY (EGD) WITH PROPOFOL;  Surgeon: Manya Silvas, MD;  Location: [facility] ENDOSCOPY;  Service: Endoscopy;  Laterality: N/A;  . ESOPHAGOGASTRODUODENOSCOPY (EGD) WITH PROPOFOL N/A 11/05/2016   Procedure: ESOPHAGOGASTRODUODENOSCOPY (EGD) WITH PROPOFOL;  Surgeon: Manya Silvas, MD;  Location: [facility] ENDOSCOPY;  Service: Endoscopy;  Laterality: N/A;  . ESOPHAGOGASTRODUODENOSCOPY (EGD) WITH PROPOFOL N/A 02/06/2017   Procedure: ESOPHAGOGASTRODUODENOSCOPY (EGD) WITH PROPOFOL;  Surgeon: Manya Silvas, MD;  Location: [facility] ENDOSCOPY;  Service: Endoscopy;  Laterality: N/A;  . ESOPHAGOGASTRODUODENOSCOPY (EGD) WITH PROPOFOL N/A 04/24/2017   Procedure: ESOPHAGOGASTRODUODENOSCOPY (EGD) WITH PROPOFOL;  Surgeon: Manya Silvas, MD;  Location: [facility] ENDOSCOPY;  Service: Endoscopy;  Laterality: N/A;  . ESOPHAGOGASTRODUODENOSCOPY (EGD) WITH PROPOFOL N/A 07/31/2017   Procedure: ESOPHAGOGASTRODUODENOSCOPY (EGD) WITH PROPOFOL;  Surgeon: Manya Silvas, MD;  Location: [facility]  ENDOSCOPY;  Service: Endoscopy;  Laterality: N/A;  . GIVENS CAPSULE STUDY N/A 12/05/2017   Procedure: GIVENS CAPSULE STUDY;  Surgeon: Manya Silvas, MD;  Location: [facility] ENDOSCOPY;  Service: Endoscopy;  Laterality: N/A;  . TONSILLECTOMY    . TONSILLECTOMY AND ADENOIDECTOMY    . ULNAR NERVE TRANSPOSITION    . UVULOPALATOPHARYNGOPLASTY      Allergies Patient has no known allergies.  Social History Social History   Tobacco Use  . Smoking status: Current Some Day Smoker    Types: Cigars  . Smokeless tobacco: Never Used  . Tobacco comment: smoke 1 at night occasionally;   Substance Use Topics  . Alcohol use: No    Comment: stopped 12 years ago  . Drug use: No   Review of Systems Constitutional: Negative for fever. Cardiovascular: Negative for chest pain. Respiratory: Negative for shortness of breath. Gastrointestinal: Negative for abdominal pain, vomiting and diarrhea.  Positive for gastrointestinal bleeding Genitourinary: Negative for dysuria. Musculoskeletal: Negative for back pain. Skin: Negative for rash. Neurological: Positive for generalized weakness  All systems negative/normal/unremarkable except as stated in the HPI  ____________________________________________   PHYSICAL EXAM:  VITAL SIGNS: ED Triage Vitals  Enc Vitals Group     BP      Pulse      Resp      Temp      Temp src      SpO2      Weight      Height      Head Circumference      Peak Flow      Pain Score      Pain Loc      Pain Edu?      Excl. in  GC?    Constitutional: Alert and oriented. Well appearing and in no distress. Eyes: Conjunctivae are pale.  Normal extraocular movements. ENT   Head: Normocephalic and atraumatic.   Nose: No congestion/rhinnorhea.   Mouth/Throat: Mucous membranes are moist.   Neck: No stridor. Cardiovascular: Normal rate, regular rhythm. No murmurs, rubs, or gallops. Respiratory: Normal respiratory effort without tachypnea nor retractions. Breath  sounds are clear and equal bilaterally. No wheezes/rales/rhonchi. Gastrointestinal: Soft and nontender. Normal bowel sounds Musculoskeletal: Nontender with normal range of motion in extremities. No lower extremity tenderness nor edema. Neurologic:  Normal speech and language. No gross focal neurologic deficits are appreciated.  Skin:  Skin is warm, dry and intact. No rash noted. Psychiatric: Mood and affect are normal. Speech and behavior are normal.  ____________________________________________  EKG: Interpreted by me.  Sinus rhythm the rate of 73 bpm, short PR interval, borderline intraventricular conduction delay, borderline prolonged QT  ____________________________________________  ED COURSE:  As part of my medical decision making, I reviewed the following data within the [place] History obtained from family if available, nursing notes, old chart and ekg, as well as notes from prior ED visits. Patient presented for weakness with possible GI bleeding, we will assess with labs and imaging as indicated at this time.   Procedures ____________________________________________   LABS (pertinent positives/negatives)  Labs Reviewed  CBC WITH DIFFERENTIAL/PLATELET - Abnormal; Notable for the following components:      Result Value   RBC 2.31 (*)    Hemoglobin 7.1 (*)    HCT 22.0 (*)    RDW 17.4 (*)    Platelets 119 (*)    Lymphs Abs 0.4 (*)    All other components within normal limits  COMPREHENSIVE METABOLIC PANEL - Abnormal; Notable for the following components:   Glucose, Bld 134 (*)    BUN 29 (*)    Creatinine, Ser 2.03 (*)    Calcium 8.8 (*)    Albumin 2.8 (*)    GFR calc non Af Amer 33 (*)    GFR calc Af Amer 38 (*)    All other components within normal limits  URINALYSIS, COMPLETE (UACMP) WITH MICROSCOPIC - Abnormal; Notable for the following components:   Color, Urine YELLOW (*)    APPearance CLEAR (*)    Squamous Epithelial / LPF 0-5 (*)    All  other components within normal limits  GLUCOSE, CAPILLARY - Abnormal; Notable for the following components:   Glucose-Capillary 116 (*)    All other components within normal limits  TROPONIN I  CBG MONITORING, ED  TYPE AND SCREEN   ____________________________________________  DIFFERENTIAL DIAGNOSIS   Gastrointestinal bleeding, anemia, electrolyte abnormality, dehydration  FINAL ASSESSMENT AND PLAN  Gastrointestinal bleeding, anemia   Plan: The patient had presented for weakness with suspicion for persistent GI bleeding. Patient's labs did not reveal any acute abnormality.  His labs are stable from March 28 when he was discharged from the hospital.  I have advised him of this and I did offer her a unit of blood transfusion because he has a pending procedure.  Patient has declined at this time.  He is supposed to have gastrointestinal procedure done in 2 days.  He is stable for outpatient follow-up.   Corey Aly, MD   Note: This note was generated in part or whole with voice recognition software. Voice recognition is usually quite accurate but there are transcription errors that can and very often do occur. I apologize for any typographical errors  that were not detected and corrected.     Earleen Newport, MD 12/30/17 [phone]

## 2017-12-31 ENCOUNTER — Other Ambulatory Visit: Payer: Self-pay

## 2017-12-31 ENCOUNTER — Emergency Department: Payer: Medicare Other

## 2017-12-31 ENCOUNTER — Inpatient Hospital Stay
Admission: EM | Admit: 2017-12-31 | Discharge: 2018-01-01 | DRG: 442 | Disposition: A | Discharge status: Home Health | Payer: Medicare Other | Attending: Internal Medicine | Admitting: Internal Medicine

## 2017-12-31 DIAGNOSIS — D61818 Other pancytopenia: Secondary | ICD-10-CM | POA: Diagnosis not present

## 2017-12-31 DIAGNOSIS — G4730 Sleep apnea, unspecified: Secondary | ICD-10-CM | POA: Diagnosis present

## 2017-12-31 DIAGNOSIS — Z6841 Body Mass Index (BMI) 40.0 and over, adult: Secondary | ICD-10-CM | POA: Diagnosis not present

## 2017-12-31 DIAGNOSIS — D62 Acute posthemorrhagic anemia: Secondary | ICD-10-CM | POA: Diagnosis present

## 2017-12-31 DIAGNOSIS — I872 Venous insufficiency (chronic) (peripheral): Secondary | ICD-10-CM | POA: Diagnosis present

## 2017-12-31 DIAGNOSIS — K7682 Hepatic encephalopathy: Secondary | ICD-10-CM

## 2017-12-31 DIAGNOSIS — W19XXXA Unspecified fall, initial encounter: Secondary | ICD-10-CM

## 2017-12-31 DIAGNOSIS — F419 Anxiety disorder, unspecified: Secondary | ICD-10-CM | POA: Diagnosis not present

## 2017-12-31 DIAGNOSIS — K219 Gastro-esophageal reflux disease without esophagitis: Secondary | ICD-10-CM | POA: Diagnosis present

## 2017-12-31 DIAGNOSIS — E785 Hyperlipidemia, unspecified: Secondary | ICD-10-CM | POA: Diagnosis not present

## 2017-12-31 DIAGNOSIS — E876 Hypokalemia: Secondary | ICD-10-CM | POA: Diagnosis present

## 2017-12-31 DIAGNOSIS — N183 Chronic kidney disease, stage 3 (moderate): Secondary | ICD-10-CM | POA: Diagnosis present

## 2017-12-31 DIAGNOSIS — I878 Other specified disorders of veins: Secondary | ICD-10-CM | POA: Diagnosis not present

## 2017-12-31 DIAGNOSIS — K7460 Unspecified cirrhosis of liver: Secondary | ICD-10-CM | POA: Diagnosis not present

## 2017-12-31 DIAGNOSIS — I129 Hypertensive chronic kidney disease with stage 1 through stage 4 chronic kidney disease, or unspecified chronic kidney disease: Secondary | ICD-10-CM | POA: Diagnosis present

## 2017-12-31 DIAGNOSIS — K31819 Angiodysplasia of stomach and duodenum without bleeding: Secondary | ICD-10-CM | POA: Diagnosis present

## 2017-12-31 DIAGNOSIS — E1121 Type 2 diabetes mellitus with diabetic nephropathy: Secondary | ICD-10-CM | POA: Diagnosis present

## 2017-12-31 DIAGNOSIS — Z823 Family history of stroke: Secondary | ICD-10-CM

## 2017-12-31 DIAGNOSIS — D649 Anemia, unspecified: Secondary | ICD-10-CM

## 2017-12-31 DIAGNOSIS — K7290 Hepatic failure, unspecified without coma: Principal | ICD-10-CM | POA: Diagnosis present

## 2017-12-31 DIAGNOSIS — E1122 Type 2 diabetes mellitus with diabetic chronic kidney disease: Secondary | ICD-10-CM | POA: Diagnosis present

## 2017-12-31 DIAGNOSIS — Z794 Long term (current) use of insulin: Secondary | ICD-10-CM

## 2017-12-31 DIAGNOSIS — Z8249 Family history of ischemic heart disease and other diseases of the circulatory system: Secondary | ICD-10-CM | POA: Diagnosis not present

## 2017-12-31 DIAGNOSIS — F17290 Nicotine dependence, other tobacco product, uncomplicated: Secondary | ICD-10-CM | POA: Diagnosis not present

## 2017-12-31 LAB — COMPREHENSIVE METABOLIC PANEL
ALK PHOS: 61 U/L (ref 38–126)
ALT: 19 U/L (ref 17–63)
AST: 32 U/L (ref 15–41)
Albumin: 2.7 g/dL — ABNORMAL LOW (ref 3.5–5.0)
Anion gap: 7 (ref 5–15)
BILIRUBIN TOTAL: 0.5 mg/dL (ref 0.3–1.2)
BUN: 33 mg/dL — ABNORMAL HIGH (ref 6–20)
CALCIUM: 8.9 mg/dL (ref 8.9–10.3)
CHLORIDE: 110 mmol/L (ref 101–111)
CO2: 22 mmol/L (ref 22–32)
CREATININE: 1.97 mg/dL — AB (ref 0.61–1.24)
GFR calc Af Amer: 39 mL/min — ABNORMAL LOW (ref >60)
GFR, EST NON AFRICAN AMERICAN: 34 mL/min — AB (ref >60)
Glucose, Bld: 171 mg/dL — ABNORMAL HIGH (ref 65–99)
Potassium: 4.5 mmol/L (ref 3.5–5.1)
Sodium: 139 mmol/L (ref 135–145)
TOTAL PROTEIN: 6.6 g/dL (ref 6.5–8.1)

## 2017-12-31 LAB — CBC
HCT: 20.1 % — ABNORMAL LOW (ref 40.0–52.0)
HEMOGLOBIN: 6.6 g/dL — AB (ref 13.0–18.0)
MCH: 30.9 pg (ref 26.0–34.0)
MCHC: 32.6 g/dL (ref 32.0–36.0)
MCV: 95 fL (ref 80.0–100.0)
Platelets: 114 10*3/uL — ABNORMAL LOW (ref 150–440)
RBC: 2.12 MIL/uL — ABNORMAL LOW (ref 4.40–5.90)
RDW: 17.2 % — ABNORMAL HIGH (ref 11.5–14.5)
WBC: 3.8 10*3/uL (ref 3.8–10.6)

## 2017-12-31 LAB — BLOOD GAS, VENOUS
Acid-base deficit: 0.8 mmol/L (ref 0.0–2.0)
Bicarbonate: 23.5 mmol/L (ref 20.0–28.0)
PCO2 VEN: 37 mmHg — AB (ref 44.0–60.0)
PH VEN: 7.41 (ref 7.250–7.430)
Patient temperature: 37

## 2017-12-31 LAB — GLUCOSE, CAPILLARY
GLUCOSE-CAPILLARY: 150 mg/dL — AB (ref 65–99)
Glucose-Capillary: 162 mg/dL — ABNORMAL HIGH (ref 65–99)

## 2017-12-31 LAB — APTT: aPTT: 26 seconds (ref 24–36)

## 2017-12-31 LAB — PROTIME-INR
INR: 1.26
Prothrombin Time: 15.7 seconds — ABNORMAL HIGH (ref 11.4–15.2)

## 2017-12-31 LAB — PREPARE RBC (CROSSMATCH)

## 2017-12-31 LAB — TROPONIN I: Troponin I: <0.03 ng/mL (ref <0.03)

## 2017-12-31 LAB — AMMONIA: AMMONIA: 100 umol/L — AB (ref 9–35)

## 2017-12-31 MED ORDER — LACTULOSE 10 GM/15ML PO SOLN
30.0000 g | Freq: Four times a day (QID) | ORAL | Status: DC
  Administered 2017-12-31 – 2018-01-01 (×3): 30 g via ORAL
  Filled 2017-12-31 (×3): qty 60

## 2017-12-31 MED ORDER — ONDANSETRON HCL 4 MG PO TABS: 4.0000 mg | ORAL_TABLET | Freq: Four times a day (QID) | ORAL | Status: DC | PRN

## 2017-12-31 MED ORDER — MAGNESIUM OXIDE 400 (241.3 MG) MG PO TABS
400.0000 mg | ORAL_TABLET | Freq: Every morning | ORAL | Status: DC
  Administered 2017-12-31: 400 mg via ORAL
  Filled 2017-12-31: qty 1

## 2017-12-31 MED ORDER — POTASSIUM CHLORIDE CRYS ER 20 MEQ PO TBCR
20.0000 meq | EXTENDED_RELEASE_TABLET | Freq: Two times a day (BID) | ORAL | Status: DC
  Administered 2017-12-31: 20 meq via ORAL
  Filled 2017-12-31 (×2): qty 1

## 2017-12-31 MED ORDER — SODIUM CHLORIDE 0.9 % IV SOLN: 250.0000 mL | INTRAVENOUS | Status: DC | PRN

## 2017-12-31 MED ORDER — SODIUM CHLORIDE 0.9% FLUSH: 3.0000 mL | INTRAVENOUS | Status: DC | PRN

## 2017-12-31 MED ORDER — SODIUM CHLORIDE 0.9 % IV SOLN
Freq: Once | INTRAVENOUS | Status: AC
  Administered 2017-12-31: 17:00:00 via INTRAVENOUS

## 2017-12-31 MED ORDER — ONDANSETRON HCL 4 MG/2ML IJ SOLN: 4.0000 mg | Freq: Four times a day (QID) | INTRAMUSCULAR | Status: DC | PRN

## 2017-12-31 MED ORDER — SPIRONOLACTONE 25 MG PO TABS
25.0000 mg | ORAL_TABLET | Freq: Two times a day (BID) | ORAL | Status: DC
  Administered 2017-12-31: 25 mg via ORAL
  Filled 2017-12-31: qty 1

## 2017-12-31 MED ORDER — SODIUM CHLORIDE 0.9% FLUSH
3.0000 mL | Freq: Two times a day (BID) | INTRAVENOUS | Status: DC
  Administered 2017-12-31: 3 mL via INTRAVENOUS

## 2017-12-31 MED ORDER — ACETAMINOPHEN 325 MG PO TABS: 650.0000 mg | ORAL_TABLET | Freq: Four times a day (QID) | ORAL | Status: DC | PRN

## 2017-12-31 MED ORDER — FERROUS SULFATE 325 (65 FE) MG PO TABS
325.0000 mg | ORAL_TABLET | Freq: Two times a day (BID) | ORAL | Status: DC
  Administered 2017-12-31: 325 mg via ORAL
  Filled 2017-12-31: qty 1

## 2017-12-31 MED ORDER — FUROSEMIDE 40 MG PO TABS
40.0000 mg | ORAL_TABLET | Freq: Two times a day (BID) | ORAL | Status: DC
  Administered 2017-12-31: 40 mg via ORAL
  Filled 2017-12-31: qty 1

## 2017-12-31 MED ORDER — ACETAMINOPHEN 650 MG RE SUPP: 650.0000 mg | Freq: Four times a day (QID) | RECTAL | Status: DC | PRN

## 2017-12-31 MED ORDER — PANTOPRAZOLE SODIUM 40 MG IV SOLR
40.0000 mg | Freq: Two times a day (BID) | INTRAVENOUS | Status: DC
  Administered 2017-12-31 (×2): 40 mg via INTRAVENOUS
  Filled 2017-12-31 (×2): qty 40

## 2017-12-31 NOTE — ED Notes (Signed)
Patient transported to CT 

## 2017-12-31 NOTE — ED Notes (Signed)
Pt urinated on himself in bed. Linens changed, brief changed, and pericare provided.   Pt had failed cath attempt when arrived.

## 2017-12-31 NOTE — ED Notes (Signed)
Attempt to call report X 1 unsuccessful. Charge RN, Nira Conn states that she was instructed to by nursing supervisor to hold off on accepting the patient.

## 2017-12-31 NOTE — ED Triage Notes (Addendum)
To ER via [facility] from home after neighbor heard him fall. Pt seen in ER yesterday for "diabetic problem", unsure if low or high. EMS reports decrease in mental status since picking patient up and change of heart rhythm to "possible 1st degree block or junctional". 18G to L forearm established. Pt also reported to be orthostatic with EMS. Arrives on 2L [place] for comfort. Pt pale, appears fatigued. Chronic GI bleed. Pt did fall, unsure of LOC.

## 2017-12-31 NOTE — ED Notes (Signed)
Report to Tiffany, RN 

## 2017-12-31 NOTE — H&P (Addendum)
[facility] [place] NAME: Bret Vanessen    MR#:  [id number]  DATE OF BIRTH:  [birth year]  DATE OF ADMISSION:  12/31/2017  PRIMARY CARE PHYSICIAN: Baxter Hire, MD   REQUESTING/REFERRING PHYSICIAN:   CHIEF COMPLAINT:   Chief Complaint  Patient presents with  . Fall    HISTORY OF PRESENT ILLNESS: Jotham Ahn  is a 67 y.o. male with a known history of arteriovenous malformations of the stomach, Barrett's esophagus, chronic kidney disease, cirrhosis of liver, diabetes mellitus, esophageal varices, GERD, hyperlipidemia, hypertension was recently discharged from our hospital last week to home patient was supposed to go to [facility] he had an appointment for a gastrointestinal procedure. Patient this morning lost balance and fell down he was confused.  Was brought by the EMS to the emergency room.  Patient is awake and alert but oriented to self but not to place and time his ammonia level is around 100.Marland Kitchen He has not been taking lactulose orally.  His hemoglobin is also low around 6.6.  Hospitalist service was consulted for further care.  No history of any head injury.  Patient was worked up with CT head which showed no acute abnormality.  Chest x-ray showed no acute cardiopulmonary disease.  PAST MEDICAL HISTORY:   Past Medical History:  Diagnosis Date  . Anemia   . Anxiety    controlled;   . Arthritis   . AVM (arteriovenous malformation) of stomach, acquired with hemorrhage   . Barrett's esophagus   . Chronic kidney disease    renal infufficiency  . Cirrhosis ([place])   . Depression    controlled;   Marland Kitchen Diabetes mellitus without complication ([place])    not controlled, taking insulin but sugar continues to run high;   . Edema   . Esophageal varices ([place])   . GERD (gastroesophageal reflux disease)   . History of hiatal hernia   . Hyperlipidemia   . Hypertension    controlled well;   . 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 Obesity   .  Pancytopenia ([place])   . Polyp, stomach    with chronic blood loss  . Sleep apnea    does not wear a cpap, Medicare would not pay for it   . Venous stasis dermatitis of both lower extremities   . Venous stasis of both lower extremities    with cellulitis    PAST SURGICAL HISTORY:  Past Surgical History:  Procedure Laterality Date  . ESOPHAGOGASTRODUODENOSCOPY N/A 02/09/2015   Procedure: ESOPHAGOGASTRODUODENOSCOPY (EGD);  Surgeon: Manya Silvas, MD;  Location: [facility] ENDOSCOPY;  Service: Endoscopy;  Laterality: N/A;  . ESOPHAGOGASTRODUODENOSCOPY N/A 07/22/2015   Procedure: ESOPHAGOGASTRODUODENOSCOPY (EGD);  Surgeon: Manya Silvas, MD;  Location: [facility] ENDOSCOPY;  Service: Endoscopy;  Laterality: N/A;  . ESOPHAGOGASTRODUODENOSCOPY N/A 06/26/2017   Procedure: ESOPHAGOGASTRODUODENOSCOPY (EGD);  Surgeon: Manya Silvas, MD;  Location: [facility] ENDOSCOPY;  Service: Endoscopy;  Laterality: N/A;  . ESOPHAGOGASTRODUODENOSCOPY N/A 06/27/2017   Procedure: ESOPHAGOGASTRODUODENOSCOPY (EGD);  Surgeon: Manya Silvas, MD;  Location: [facility] ENDOSCOPY;  Service: Endoscopy;  Laterality: N/A;  . ESOPHAGOGASTRODUODENOSCOPY N/A 06/28/2017   Procedure: ESOPHAGOGASTRODUODENOSCOPY (EGD);  Surgeon: Manya Silvas, MD;  Location: [facility] ENDOSCOPY;  Service: Endoscopy;  Laterality: N/A;  . ESOPHAGOGASTRODUODENOSCOPY Bilateral 10/11/2017   Procedure: ESOPHAGOGASTRODUODENOSCOPY (EGD);  Surgeon: Manya Silvas, MD;  Location: [facility] ENDOSCOPY;  Service: Endoscopy;  Laterality: Bilateral;  . ESOPHAGOGASTRODUODENOSCOPY N/A 12/04/2017   Procedure: ESOPHAGOGASTRODUODENOSCOPY (EGD);  Surgeon: Manya Silvas, MD;  Location: [facility] ENDOSCOPY;  Service: Endoscopy;  Laterality: N/A;  . ESOPHAGOGASTRODUODENOSCOPY (EGD) WITH PROPOFOL N/A 07/20/2015   Procedure: ESOPHAGOGASTRODUODENOSCOPY (EGD) WITH PROPOFOL;  Surgeon: Manya Silvas, MD;  Location: [facility] ENDOSCOPY;  Service: Endoscopy;  Laterality: N/A;  .  ESOPHAGOGASTRODUODENOSCOPY (EGD) WITH PROPOFOL N/A 09/16/2015   Procedure: ESOPHAGOGASTRODUODENOSCOPY (EGD) WITH PROPOFOL;  Surgeon: Manya Silvas, MD;  Location: [facility] ENDOSCOPY;  Service: Endoscopy;  Laterality: N/A;  . ESOPHAGOGASTRODUODENOSCOPY (EGD) WITH PROPOFOL N/A 03/16/2016   Procedure: ESOPHAGOGASTRODUODENOSCOPY (EGD) WITH PROPOFOL;  Surgeon: Manya Silvas, MD;  Location: [facility] ENDOSCOPY;  Service: Endoscopy;  Laterality: N/A;  . ESOPHAGOGASTRODUODENOSCOPY (EGD) WITH PROPOFOL N/A 09/14/2016   Procedure: ESOPHAGOGASTRODUODENOSCOPY (EGD) WITH PROPOFOL;  Surgeon: Manya Silvas, MD;  Location: [facility] ENDOSCOPY;  Service: Endoscopy;  Laterality: N/A;  . ESOPHAGOGASTRODUODENOSCOPY (EGD) WITH PROPOFOL N/A 11/05/2016   Procedure: ESOPHAGOGASTRODUODENOSCOPY (EGD) WITH PROPOFOL;  Surgeon: Manya Silvas, MD;  Location: [facility] ENDOSCOPY;  Service: Endoscopy;  Laterality: N/A;  . ESOPHAGOGASTRODUODENOSCOPY (EGD) WITH PROPOFOL N/A 02/06/2017   Procedure: ESOPHAGOGASTRODUODENOSCOPY (EGD) WITH PROPOFOL;  Surgeon: Manya Silvas, MD;  Location: [facility] ENDOSCOPY;  Service: Endoscopy;  Laterality: N/A;  . ESOPHAGOGASTRODUODENOSCOPY (EGD) WITH PROPOFOL N/A 04/24/2017   Procedure: ESOPHAGOGASTRODUODENOSCOPY (EGD) WITH PROPOFOL;  Surgeon: Manya Silvas, MD;  Location: [facility] ENDOSCOPY;  Service: Endoscopy;  Laterality: N/A;  . ESOPHAGOGASTRODUODENOSCOPY (EGD) WITH PROPOFOL N/A 07/31/2017   Procedure: ESOPHAGOGASTRODUODENOSCOPY (EGD) WITH PROPOFOL;  Surgeon: Manya Silvas, MD;  Location: [facility] ENDOSCOPY;  Service: Endoscopy;  Laterality: N/A;  . GIVENS CAPSULE STUDY N/A 12/05/2017   Procedure: GIVENS CAPSULE STUDY;  Surgeon: Manya Silvas, MD;  Location: [facility] ENDOSCOPY;  Service: Endoscopy;  Laterality: N/A;  . TONSILLECTOMY    . TONSILLECTOMY AND ADENOIDECTOMY    . ULNAR NERVE TRANSPOSITION    . UVULOPALATOPHARYNGOPLASTY      SOCIAL HISTORY:  Social History   Tobacco Use  . Smoking status:  Current Some Day Smoker    Types: Cigars  . Smokeless tobacco: Never Used  . Tobacco comment: smoke 1 at night occasionally;   Substance Use Topics  . Alcohol use: No    Comment: stopped 12 years ago    FAMILY HISTORY:  Family History  Problem Relation Age of Onset  . Diabetes Other   . Transient ischemic attack Father   . CAD Father     DRUG ALLERGIES: No Known Allergies  REVIEW OF SYSTEMS:  Could not be obtained as patient is confused  MEDICATIONS AT HOME:  Prior to Admission medications   Medication Sig Start Date End Date Taking? Authorizing Provider  FERROUS SULFATE ER PO Take 325 mg by mouth 2 (two) times daily.    Yes [provider]  furosemide (LASIX) 40 MG tablet Take 40 mg by mouth 2 (two) times daily.   Yes [provider]  insulin aspart protamine- aspart (NOVOLOG MIX 70/30) (70-30) 100 UNIT/ML injection Inject 0.2 mLs (20 Units total) into the skin 2 (two) times daily with a meal. Patient taking differently: Inject 84u every morning and 65u every evening 12/18/17  Yes Fritzi Mandes, MD  lactulose (CHRONULAC) 10 GM/15ML solution Take 30 mLs (20 g total) by mouth 2 (two) times daily. Patient taking differently: Take 20 g by mouth 4 (four) times daily.  12/18/17  Yes Fritzi Mandes, MD  LORazepam (ATIVAN) 0.5 MG tablet Take 0.5 mg by mouth 3 (three) times daily as needed for anxiety.    Yes [provider]  Magnesium Oxide 400 (240 Mg) MG TABS  Take 1 tablet (400 mg total) by mouth every morning. 12/05/17  Yes Wieting, Richard, MD  Multiple Vitamins-Minerals (CENTRUM SILVER 50+MEN) TABS Take 1 tablet by mouth 2 (two) times daily.    Yes [provider]  pantoprazole (PROTONIX) 40 MG tablet Take 1 tablet (40 mg total) by mouth daily. 12/05/17 12/05/18 Yes Wieting, Richard, MD  potassium chloride SA (K-DUR,KLOR-CON) 20 MEQ tablet Take 2 tablets (40 mEq total) by mouth 2 (two) times daily. Patient taking differently: Take 20 mEq by mouth 2 (two) times  daily.  12/05/17  Yes Wieting, Richard, MD  spironolactone (ALDACTONE) 25 MG tablet Take 1 tablet (25 mg total) by mouth 2 (two) times daily. 12/05/17  Yes Wieting, Richard, MD  traMADol (ULTRAM) 50 MG tablet Take 50 mg by mouth every 8 (eight) hours as needed.    Yes [provider]  cephALEXin (KEFLEX) 500 MG capsule Take 1 capsule (500 mg total) by mouth every 8 (eight) hours. Patient not taking: Reported on 12/31/2017 12/18/17   Fritzi Mandes, MD      PHYSICAL EXAMINATION:   VITAL SIGNS: Blood pressure (!) 149/51, pulse 81, temperature 98.4 F (36.9 C), temperature source Oral, resp. rate 18, height 5\' 9"  (1.753 m), weight (!) 140.6 kg (310 lb), SpO2 99 %.  GENERAL:  67 y.o.-year-old elderly male patient lying in the bed  EYES: Pupils equal, round, reactive to light and accommodation. No scleral icterus. Extraocular muscles intact.  HEENT: Head atraumatic, normocephalic. Oropharynx and nasopharynx clear.  NECK:  Supple, no jugular venous distention. No thyroid enlargement, no tenderness.  LUNGS: Normal breath sounds bilaterally, no wheezing, rales,rhonchi or crepitation. No use of accessory muscles of respiration.  CARDIOVASCULAR: S1, S2 normal. No murmurs, rubs, or gallops.  ABDOMEN: Soft, nontender, nondistended. Bowel sounds present. No organomegaly or mass.  EXTREMITIES : has ecchymosis over upper extremities Brawny induration over lower extremities NEUROLOGIC:Awake, not completely oriented to time , place Moves all extremities  Gait not checked.  PSYCHIATRIC: could not be assessed SKIN: No obvious rash, lesion, or ulcer.   LABORATORY PANEL:   CBC Recent Labs  Lab 12/25/17 1346 12/25/17 2122 12/26/17 1024 12/30/17 0841 12/31/17 1033  WBC 4.9 4.3 4.1 4.3 3.8  HGB 6.5* 6.2* 7.1* 7.1* 6.6*  HCT 19.9* 19.0* 21.4* 22.0* 20.1*  PLT 131* 115* 126* 119* 114*  MCV 95.5 92.6 92.4 95.1 95.0  MCH 31.3 30.2 30.6 30.8 30.9  MCHC 32.8 32.6 33.1 32.4 32.6  RDW 17.3* 17.3* 17.3*  17.4* 17.2*  LYMPHSABS  --   --   --  0.4*  --   MONOABS  --   --   --  0.4  --   EOSABS  --   --   --  0.0  --   BASOSABS  --   --   --  0.0  --    ------------------------------------------------------------------------------------------------------------------  Chemistries  Recent Labs  Lab 12/25/17 1346 12/26/17 1024 12/30/17 0841 12/31/17 1033  NA 137 140 138 139  K 4.3 4.4 4.8 4.5  CL 107 107 109 110  CO2 21* 24 23 22   GLUCOSE 114* 137* 134* 171*  BUN 29* 30* 29* 33*  CREATININE 1.95* 2.03* 2.03* 1.97*  CALCIUM 8.6* 8.7* 8.8* 8.9  AST 36  --  29 32  ALT 19  --  19 19  ALKPHOS 65  --  71 61  BILITOT 0.6  --  0.5 0.5   ------------------------------------------------------------------------------------------------------------------ estimated creatinine clearance is 52.2 mL/min (A) (by C-G formula  based on SCr of 1.97 mg/dL (H)). ------------------------------------------------------------------------------------------------------------------ No results for input(s): TSH, T4TOTAL, T3FREE, THYROIDAB in the last 72 hours.  Invalid input(s): FREET3   Coagulation profile Recent Labs  Lab 12/31/17 1033  INR 1.26   ------------------------------------------------------------------------------------------------------------------- No results for input(s): DDIMER in the last 72 hours. -------------------------------------------------------------------------------------------------------------------  Cardiac Enzymes Recent Labs  Lab 12/30/17 0841 12/31/17 1033  TROPONINI <0.03 <0.03   ------------------------------------------------------------------------------------------------------------------ Invalid input(s): POCBNP  ---------------------------------------------------------------------------------------------------------------  Urinalysis    Component Value Date/Time   COLORURINE YELLOW (A) 12/30/2017 1015   APPEARANCEUR CLEAR (A) 12/30/2017 1015    APPEARANCEUR Clear 09/10/2014 0456   LABSPEC 1.010 12/30/2017 1015   LABSPEC 1.016 09/10/2014 0456   PHURINE 7.0 12/30/2017 1015   GLUCOSEU NEGATIVE 12/30/2017 1015   GLUCOSEU Negative 09/10/2014 0456   HGBUR NEGATIVE 12/30/2017 1015   BILIRUBINUR NEGATIVE 12/30/2017 1015   BILIRUBINUR Negative 09/10/2014 0456   KETONESUR NEGATIVE 12/30/2017 1015   PROTEINUR NEGATIVE 12/30/2017 1015   NITRITE NEGATIVE 12/30/2017 1015   LEUKOCYTESUR NEGATIVE 12/30/2017 1015   LEUKOCYTESUR Negative 09/10/2014 0456     RADIOLOGY: Ct Head Wo Contrast  Result Date: 12/31/2017 CLINICAL DATA:  Fall, mental status changes EXAM: CT HEAD WITHOUT CONTRAST TECHNIQUE: Contiguous axial images were obtained from the base of the skull through the vertex without intravenous contrast. Sagittal and coronal MPR images reconstructed from axial data set. COMPARISON:  12/15/2016 FINDINGS: Brain: Mild atrophy. Normal ventricular morphology. No midline shift or mass effect. Otherwise normal appearance of brain parenchyma. No intracranial hemorrhage, mass lesion, or evidence of acute infarction. No extra-axial fluid collections. Vascular: Atherosclerotic calcification of vertebral arteries and minimally LEFT internal carotid artery at skull base Skull: Intact Sinuses/Orbits: Clear Other: N/A IMPRESSION: Mild atrophy. No acute intracranial abnormalities. Electronically Signed   By: Lavonia Dana M.D.   On: 12/31/2017 11:33   Dg Chest Portable 1 View  Result Date: 12/31/2017 CLINICAL DATA:  Weakness EXAM: PORTABLE CHEST 1 VIEW COMPARISON:  12/25/2017 FINDINGS: Cardiac enlargement without heart failure. Lungs clear without infiltrate effusion. IMPRESSION: No active disease. Electronically Signed   By: Franchot Gallo M.D.   On: 12/31/2017 11:36    EKG: Orders placed or performed during the hospital encounter of 12/31/17  . ED EKG  . ED EKG  . EKG 12-Lead  . EKG 12-Lead  . EKG 12-Lead  . EKG 12-Lead    IMPRESSION AND  PLAN:  67 year old male patient with history of arteriovenous malformation of the stomach, cirrhosis of liver, Barrett's esophagus diabetes mellitus, esophageal varices, presented to the emergency room for fall and confusion.  1 hepatic encephalopathy.  Start patient on oral lactulose Follow-up ammonia level  2.  Altered mental status secondary to hepatic encephalopathy  3.  Symptomatic anemia Transfuse 1 unit PRBC IV  4.  Gastrointestinal bleeding Gastroenterology consultation Protonix 40 mg IV q. 12 hourly Hold blood thinner medications  5 cirrhosis of liver with arteriovenous malformations in the stomach Gastroenterology consultation  6.  DVT prophylaxis sequential compression device to lower extremities  All the records are reviewed and case discussed with ED provider. Management plans discussed with the patient, family and they are in agreement.  CODE STATUS:FULL CODE Code Status History    Date Active Date Inactive Code Status Order ID Comments User Context   12/25/2017 1456 12/26/2017 1618 Full Code [id number]  Epifanio Lesches, MD ED   12/16/2017 0921 12/18/2017 1635 DNR [id number]  Max Sane, MD Inpatient   12/16/2017 0305 12/16/2017 0921 Full Code [id number]  Salary, Avel Peace,  MD ED   12/02/2017 2003 12/05/2017 2000 Full Code [id number]  Saundra Shelling, MD Inpatient   11/21/2017 1948 11/23/2017 1958 Full Code [id number]  Loletha Grayer, MD ED   10/23/2017 1405 10/26/2017 1402 Full Code [id number]  Bettey Costa, MD Inpatient   10/18/2017 0058 10/19/2017 1806 Full Code [id number]  Saundra Shelling, MD Inpatient   10/09/2017 2139 10/11/2017 1806 Full Code [id number]  [name], MD Inpatient   09/03/2017 2124 09/05/2017 1552 Full Code [id number]  Nicholes Mango, MD Inpatient   06/26/2017 1119 06/28/2017 1748 Full Code [id number]  Henreitta Leber, MD Inpatient   02/21/2017 2007 02/22/2017 1803 Full Code [id number]  [name], MD Inpatient   07/20/2015 1245 07/22/2015 2217 Full  Code [id number]  Nicholes Mango, MD Inpatient   02/08/2015 2331 02/11/2015 1943 Full Code [id number]  Hower, Aaron Mose, MD ED       TOTAL TIME TAKING CARE OF THIS PATIENT: 54 minutes.    Saundra Shelling M.D on 12/31/2017 at 1:52 PM  Between 7am to 6pm - Pager - [phone] Ascom 4139  After 6pm go to [email or web address] - password [email or web address] [place] [facility]  Office  [phone]  CC: Primary care physician; Baxter Hire, MD

## 2017-12-31 NOTE — Progress Notes (Signed)
Pt's niece [name] called and asked for updates about the pt's pending procedure tomorrow at [facility]. Pt has been ordered to be NPO but as of this writing, no further notes/orders written on Epic about a possible transfer in connection to the procedure. Per niece, pt is scheduled to have the procedure at 8am tomorrow and Dr. Vira Agar was the one who recommended it. Pt's niece also made aware  By this writer that pt will be getting a unit of blood tonight.

## 2017-12-31 NOTE — Clinical Social Work Note (Addendum)
Clinical Social Work Assessment  Patient Details  Name: Corey West MRN: [id number] Date of Birth: 11-May-1951  Date of referral:  12/31/17               Reason for consult:  Discharge Planning                Permission sought to share information with:    Permission granted to share information::     Name::        Agency::     Relationship::     Contact Information:     Housing/Transportation Living arrangements for the past 2 months:  Single Family Home Source of Information:  Patient Patient Interpreter Needed:  None Criminal Activity/Legal Involvement Pertinent to Current Situation/Hospitalization:  No - Comment as needed Significant Relationships:  Other Family Members Lives with:  Self Do you feel safe going back to the place where you live?  Yes Need for family participation in patient care:  Yes (Comment)  Care giving concerns: Patient lives alone in [place].   Social Worker assessment / plan: Holiday representative (CSW) received a frequent readmissions consult. Patient has been noncompliant and was readmitted to the hospital today (12/31/17). Social work Theatre manager met with patient alone at bedside. Patient was laying down in bed alert and oriented to self, place, and situation. Social work Theatre manager introduced self and explained the role of the [place]. Patient shared he lives alone in [place] and niece [name] [phone]) is his support. Patient shared he drives himself to doctors appointment and uses a walker sometimes at baseline. Patient shared he is not currently using any drugs and is not drinking alcohol. Patient also shared that he receives a SSI/ disability check of about $1,500 a month. Social work Theatre manager asked patient about his interest in going to a nursing home for rehab and patient refused. Social work Theatre manager presented patient with an [employer]. Patient accepted list and shared he is in need of no assistance at this time. CSW and Social  work Theatre manager will continue to follow up and assist.  Employment status:  Retired Forensic scientist:  Medicare PT Recommendations:  Not assessed at this time Information / Referral to community resources:     Patient/Family's Response to care: Patient shared he is not in need of any assistance.  Patient/Family's Understanding of and Emotional Response to Diagnosis, Current Treatment, and Prognosis: Patient was pleasant and thanked social work Theatre manager for her assistance.  Emotional Assessment Appearance:  Appears stated age Attitude/Demeanor/Rapport:    Affect (typically observed):  Accepting, Pleasant, Calm Orientation:  Oriented to Self, Oriented to Place, Oriented to Situation Alcohol / Substance use:  Not Applicable Psych involvement (Current and /or in the community):  No (Comment)  Discharge Needs  Concerns to be addressed:  Discharge Planning Concerns, Care Coordination Readmission within the last 30 days:  Yes Current discharge risk:  Dependent with Mobility Barriers to Discharge:  Continued Medical Work up   Smith Mince, Student-Social Work 12/31/2017, 3:26 PM

## 2017-12-31 NOTE — Care Management (Addendum)
Multiple presentations to hospital. Sent home [facility] yesterday. Patient was in OBS status and insisted on leaving after blood transfusion.  I have notified Dr. Doy Hutching of need for [facility]; I have notified [name] with [facility] that patient was back in ED. RNCM had arranged for patient to be seen at [facility]/PCP office at last discharge however he is back in ED now confused. Per previous RNCM note in March 2019, [place] referral was placed. Despite resources made, RNCM unable to assist patient with transition to outpatient community.  CSW consult placed for assessment.

## 2017-12-31 NOTE — NC FL2 (Signed)
  [place] LEVEL OF CARE SCREENING TOOL     IDENTIFICATION  Patient Name: Corey West Birthdate: 1951/01/18 Sex: male Admission Date (Current Location): 12/31/2017  [place] and [place] Number:  Engineering geologist and Address:  [facility], [address] [place], [place] [address]      Provider Number: [id number]  Attending Physician Name and Address:  Saundra Shelling, MD  Relative Name and Phone Number:       Current Level of Care: Hospital Recommended Level of Care: [place] Prior Approval Number:    Date Approved/Denied:   PASRR Number: [id number])  Discharge Plan: SNF    Current Diagnoses: Patient Active Problem List   Diagnosis Date Noted  . Goals of care, counseling/discussion   . Palliative care encounter   . Hepatic encephalopathy syndrome ([place]) 12/16/2017  . Hepatic encephalopathy ([place]) 10/18/2017  . Hypokalemia 10/09/2017  . Hyperglycemia 10/09/2017  . Symptomatic anemia 06/26/2017  . Sepsis ([place]) 06/12/2017  . Cellulitis in diabetic foot ([place]) 02/21/2017  . Uncontrolled diabetes mellitus ([place]) 02/21/2017  . Cellulitis 02/21/2017  . GI bleed 07/20/2015  . Hematemesis 02/08/2015  . Cirrhosis ([place]) 02/08/2015  . Esophageal varices ([place]) 02/08/2015  . Type 2 diabetes mellitus ([place]) 02/08/2015  . Gastroesophageal reflux disease 02/08/2015  . Hyperlipidemia 02/08/2015  . Iron deficiency anemia 12/04/2013  . Thrombocytopenia ([place]) 12/04/2013  . Splenomegaly 11/04/2013    Orientation RESPIRATION BLADDER Height & Weight     Self, Situation, Place  Normal Incontinent Weight: (!) 310 lb (140.6 kg) Height:  5\' 9"  (175.3 cm)  BEHAVIORAL SYMPTOMS/MOOD NEUROLOGICAL BOWEL NUTRITION STATUS      Continent Diet  AMBULATORY STATUS COMMUNICATION OF NEEDS Skin   Extensive Assist Verbally Normal                       Personal Care Assistance Level of Assistance  Bathing, Feeding, Dressing Bathing  Assistance: Limited assistance Feeding assistance: Independent Dressing Assistance: Limited assistance     Functional Limitations Info  Sight, Hearing, Speech Sight Info: Impaired Hearing Info: Impaired Speech Info: Adequate    SPECIAL CARE FACTORS FREQUENCY  PT (By licensed PT), OT (By licensed OT)     PT Frequency: (5) OT Frequency: (5)            Contractures      Additional Factors Info  Code Status, Allergies Code Status Info: (Full Code) Allergies Info: (No Known Allergies)           Current Medications (12/31/2017):  This is the current hospital active medication list Current Facility-Administered Medications  Medication Dose Route Frequency Provider Last Rate Last Dose  . 0.9 %  sodium chloride infusion   Intravenous Once Pyreddy, Pavan, MD      . lactulose (CHRONULAC) 10 GM/15ML solution 30 g  30 g Oral Q6H Pyreddy, Pavan, MD   30 g at 12/31/17 1347  . pantoprazole (PROTONIX) injection 40 mg  40 mg Intravenous Q12H Saundra Shelling, MD   40 mg at 12/31/17 1347     Discharge Medications: Please see discharge summary for a list of discharge medications.  Relevant Imaging Results:  Relevant Lab Results:   Additional Information (SSN: [id number])  Smith Mince, Student-Social Work

## 2017-12-31 NOTE — ED Provider Notes (Addendum)
[facility] Emergency Department Provider Note   ____________________________________________    I have reviewed the triage vital signs and the nursing notes.   HISTORY  Chief Complaint Fall   History somewhat limited as the patient is confused  HPI Corey West is a 67 y.o. male the past medical history as detailed below who presents today after a fall.  Apparently the patient's neighbor heard him fall and called EMS.  Patient reports he was dizzy and lightheaded prior to falling.  Denies injury.  Patient reports he was getting out of bed and had just stood up and as when he felt lightheaded and dizzy.  Reports he was in the emergency department yesterday for "glucose issues ".  Review of medical records and history the patient was seen yesterday for chronic blood loss anemia, hemoglobin is at baseline, patient was offered PRBCs but declined since he has a GI procedure in 1 day.  No chest pain no abdominal pain.   Past Medical History:  Diagnosis Date  . Anemia   . Anxiety    controlled;   . Arthritis   . AVM (arteriovenous malformation) of stomach, acquired with hemorrhage   . Barrett's esophagus   . Chronic kidney disease    renal infufficiency  . Cirrhosis ([place])   . Depression    controlled;   Marland Kitchen Diabetes mellitus without complication ([place])    not controlled, taking insulin but sugar continues to run high;   . Edema   . Esophageal varices ([place])   . GERD (gastroesophageal reflux disease)   . History of hiatal hernia   . Hyperlipidemia   . Hypertension    controlled well;   . 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 Obesity   . Pancytopenia ([place])   . Polyp, stomach    with chronic blood loss  . Sleep apnea    does not wear a cpap, Medicare would not pay for it   . Venous stasis dermatitis of both lower extremities   . Venous stasis of both lower extremities    with cellulitis    Patient Active Problem List   Diagnosis Date Noted  . Goals  of care, counseling/discussion   . Palliative care encounter   . Hepatic encephalopathy syndrome ([place]) 12/16/2017  . Hepatic encephalopathy ([place]) 10/18/2017  . Hypokalemia 10/09/2017  . Hyperglycemia 10/09/2017  . Symptomatic anemia 06/26/2017  . Sepsis ([place]) 06/12/2017  . Cellulitis in diabetic foot ([place]) 02/21/2017  . Uncontrolled diabetes mellitus ([place]) 02/21/2017  . Cellulitis 02/21/2017  . GI bleed 07/20/2015  . Hematemesis 02/08/2015  . Cirrhosis ([place]) 02/08/2015  . Esophageal varices ([place]) 02/08/2015  . Type 2 diabetes mellitus ([place]) 02/08/2015  . Gastroesophageal reflux disease 02/08/2015  . Hyperlipidemia 02/08/2015  . Iron deficiency anemia 12/04/2013  . Thrombocytopenia ([place]) 12/04/2013  . Splenomegaly 11/04/2013    Past Surgical History:  Procedure Laterality Date  . ESOPHAGOGASTRODUODENOSCOPY N/A 02/09/2015   Procedure: ESOPHAGOGASTRODUODENOSCOPY (EGD);  Surgeon: Manya Silvas, MD;  Location: [facility] ENDOSCOPY;  Service: Endoscopy;  Laterality: N/A;  . ESOPHAGOGASTRODUODENOSCOPY N/A 07/22/2015   Procedure: ESOPHAGOGASTRODUODENOSCOPY (EGD);  Surgeon: Manya Silvas, MD;  Location: [facility] ENDOSCOPY;  Service: Endoscopy;  Laterality: N/A;  . ESOPHAGOGASTRODUODENOSCOPY N/A 06/26/2017   Procedure: ESOPHAGOGASTRODUODENOSCOPY (EGD);  Surgeon: Manya Silvas, MD;  Location: [facility] ENDOSCOPY;  Service: Endoscopy;  Laterality: N/A;  . ESOPHAGOGASTRODUODENOSCOPY N/A 06/27/2017   Procedure: ESOPHAGOGASTRODUODENOSCOPY (EGD);  Surgeon: Manya Silvas, MD;  Location: [facility] ENDOSCOPY;  Service: Endoscopy;  Laterality: N/A;  . ESOPHAGOGASTRODUODENOSCOPY N/A 06/28/2017   Procedure: ESOPHAGOGASTRODUODENOSCOPY (EGD);  Surgeon: Manya Silvas, MD;  Location: [facility] ENDOSCOPY;  Service: Endoscopy;  Laterality: N/A;  . ESOPHAGOGASTRODUODENOSCOPY Bilateral 10/11/2017   Procedure: ESOPHAGOGASTRODUODENOSCOPY (EGD);  Surgeon: Manya Silvas, MD;  Location[facility] ENDOSCOPY;  Service: Endoscopy;   Laterality: Bilateral;  . ESOPHAGOGASTRODUODENOSCOPY N/A 12/04/2017   Procedure: ESOPHAGOGASTRODUODENOSCOPY (EGD);  Surgeon: Manya Silvas, MD;  Location: [facility] ENDOSCOPY;  Service: Endoscopy;  Laterality: N/A;  . ESOPHAGOGASTRODUODENOSCOPY (EGD) WITH PROPOFOL N/A 07/20/2015   Procedure: ESOPHAGOGASTRODUODENOSCOPY (EGD) WITH PROPOFOL;  Surgeon: Manya Silvas, MD;  Location: [facility] ENDOSCOPY;  Service: Endoscopy;  Laterality: N/A;  . ESOPHAGOGASTRODUODENOSCOPY (EGD) WITH PROPOFOL N/A 09/16/2015   Procedure: ESOPHAGOGASTRODUODENOSCOPY (EGD) WITH PROPOFOL;  Surgeon: Manya Silvas, MD;  Location: [facility] ENDOSCOPY;  Service: Endoscopy;  Laterality: N/A;  . ESOPHAGOGASTRODUODENOSCOPY (EGD) WITH PROPOFOL N/A 03/16/2016   Procedure: ESOPHAGOGASTRODUODENOSCOPY (EGD) WITH PROPOFOL;  Surgeon: Manya Silvas, MD;  Location: [facility] ENDOSCOPY;  Service: Endoscopy;  Laterality: N/A;  . ESOPHAGOGASTRODUODENOSCOPY (EGD) WITH PROPOFOL N/A 09/14/2016   Procedure: ESOPHAGOGASTRODUODENOSCOPY (EGD) WITH PROPOFOL;  Surgeon: Manya Silvas, MD;  Location: [facility] ENDOSCOPY;  Service: Endoscopy;  Laterality: N/A;  . ESOPHAGOGASTRODUODENOSCOPY (EGD) WITH PROPOFOL N/A 11/05/2016   Procedure: ESOPHAGOGASTRODUODENOSCOPY (EGD) WITH PROPOFOL;  Surgeon: Manya Silvas, MD;  Location: [facility] ENDOSCOPY;  Service: Endoscopy;  Laterality: N/A;  . ESOPHAGOGASTRODUODENOSCOPY (EGD) WITH PROPOFOL N/A 02/06/2017   Procedure: ESOPHAGOGASTRODUODENOSCOPY (EGD) WITH PROPOFOL;  Surgeon: Manya Silvas, MD;  Location: [facility] ENDOSCOPY;  Service: Endoscopy;  Laterality: N/A;  . ESOPHAGOGASTRODUODENOSCOPY (EGD) WITH PROPOFOL N/A 04/24/2017   Procedure: ESOPHAGOGASTRODUODENOSCOPY (EGD) WITH PROPOFOL;  Surgeon: Manya Silvas, MD;  Location: [facility] ENDOSCOPY;  Service: Endoscopy;  Laterality: N/A;  . ESOPHAGOGASTRODUODENOSCOPY (EGD) WITH PROPOFOL N/A 07/31/2017   Procedure: ESOPHAGOGASTRODUODENOSCOPY (EGD) WITH PROPOFOL;  Surgeon: Manya Silvas,  MD;  Location: [facility] ENDOSCOPY;  Service: Endoscopy;  Laterality: N/A;  . GIVENS CAPSULE STUDY N/A 12/05/2017   Procedure: GIVENS CAPSULE STUDY;  Surgeon: Manya Silvas, MD;  Location: [facility] ENDOSCOPY;  Service: Endoscopy;  Laterality: N/A;  . TONSILLECTOMY    . TONSILLECTOMY AND ADENOIDECTOMY    . ULNAR NERVE TRANSPOSITION    . UVULOPALATOPHARYNGOPLASTY      Prior to Admission medications   Medication Sig Start Date End Date Taking? Authorizing Provider  FERROUS SULFATE ER PO Take 325 mg by mouth 2 (two) times daily.    Yes [provider]  furosemide (LASIX) 40 MG tablet Take 40 mg by mouth 2 (two) times daily.   Yes [provider]  insulin aspart protamine- aspart (NOVOLOG MIX 70/30) (70-30) 100 UNIT/ML injection Inject 0.2 mLs (20 Units total) into the skin 2 (two) times daily with a meal. Patient taking differently: Inject 84u every morning and 65u every evening 12/18/17  Yes Fritzi Mandes, MD  lactulose (CHRONULAC) 10 GM/15ML solution Take 30 mLs (20 g total) by mouth 2 (two) times daily. Patient taking differently: Take 20 g by mouth 4 (four) times daily.  12/18/17  Yes Fritzi Mandes, MD  LORazepam (ATIVAN) 0.5 MG tablet Take 0.5 mg by mouth 3 (three) times daily as needed for anxiety.    Yes [provider]  Magnesium Oxide 400 (240 Mg) MG TABS Take 1 tablet (400 mg total) by mouth every morning. 12/05/17  Yes Wieting, Richard, MD  Multiple Vitamins-Minerals (CENTRUM SILVER 50+MEN) TABS Take 1 tablet by mouth 2 (two) times daily.    Yes [provider]  pantoprazole (  PROTONIX) 40 MG tablet Take 1 tablet (40 mg total) by mouth daily. 12/05/17 12/05/18 Yes Wieting, Richard, MD  potassium chloride SA (K-DUR,KLOR-CON) 20 MEQ tablet Take 2 tablets (40 mEq total) by mouth 2 (two) times daily. Patient taking differently: Take 20 mEq by mouth 2 (two) times daily.  12/05/17  Yes Wieting, Richard, MD  spironolactone (ALDACTONE) 25 MG tablet Take 1 tablet (25 mg total) by  mouth 2 (two) times daily. 12/05/17  Yes Wieting, Richard, MD  traMADol (ULTRAM) 50 MG tablet Take 50 mg by mouth every 8 (eight) hours as needed.    Yes [provider]  cephALEXin (KEFLEX) 500 MG capsule Take 1 capsule (500 mg total) by mouth every 8 (eight) hours. Patient not taking: Reported on 12/31/2017 12/18/17   Fritzi Mandes, MD     Allergies Patient has no known allergies.  Family History  Problem Relation Age of Onset  . Diabetes Other   . Transient ischemic attack Father   . CAD Father     Social History Social History   Tobacco Use  . Smoking status: Current Some Day Smoker    Types: Cigars  . Smokeless tobacco: Never Used  . Tobacco comment: smoke 1 at night occasionally;   Substance Use Topics  . Alcohol use: No    Comment: stopped 12 years ago  . Drug use: No    Review of Systems  Constitutional: No reports of fever Eyes: No visual changes.  ENT: No neck pain Cardiovascular: Denies chest pain. Respiratory: Denies shortness of breath.  On home O2 Gastrointestinal: No abdominal pain.  No nausea, no vomiting.   Genitourinary: Negative for dysuria. Musculoskeletal: Negative for back pain. Skin: Negative for rash. Neurological: Negative for headaches   ____________________________________________   PHYSICAL EXAM:  VITAL SIGNS: ED Triage Vitals  Enc Vitals Group     BP 12/31/17 1027 (!) 149/48     Pulse Rate 12/31/17 1027 84     Resp 12/31/17 1027 18     Temp 12/31/17 1028 98.4 F (36.9 C)     Temp Source 12/31/17 1028 Oral     SpO2 12/31/17 1027 100 %     Weight 12/31/17 1028 (!) 140.6 kg (310 lb)     Height 12/31/17 1028 1.753 m (5\' 9" )     Head Circumference --      Peak Flow --      Pain Score --      Pain Loc --      Pain Edu? --      Excl. in [place]? --     Constitutional: Alert, but confused.  No acute distress.  Eyes: Conjunctivae are normal.  Head: Atraumatic. Nose: No congestion/rhinnorhea. Mouth/Throat: Mucous membranes are  moist.    Cardiovascular: Normal rate, regular rhythm. Grossly normal heart sounds.  Good peripheral circulation. Respiratory: Normal respiratory effort.  No retractions. Lungs CTAB. Gastrointestinal: Soft, no significant tenderness, some distention, suspicious for ascites Genitourinary: deferred Musculoskeletal: Ace bandages bilateral lower extremities, significant edema, warm and well perfused Neurologic: Confusion, when told to open his eyes he closed his mouth. No gross focal neurologic deficits are appreciated.  Skin:  Skin is warm, dry and intact. No rash noted. Psychiatric: Mood and affect are normal. Speech and behavior are normal.  ____________________________________________   LABS (all labs ordered are listed, but only abnormal results are displayed)  Labs Reviewed  CBC - Abnormal; Notable for the following components:      Result Value   RBC 2.12 (*)  Hemoglobin 6.6 (*)    HCT 20.1 (*)    RDW 17.2 (*)    Platelets 114 (*)    All other components within normal limits  PROTIME-INR - Abnormal; Notable for the following components:   Prothrombin Time 15.7 (*)    All other components within normal limits  COMPREHENSIVE METABOLIC PANEL - Abnormal; Notable for the following components:   Glucose, Bld 171 (*)    BUN 33 (*)    Creatinine, Ser 1.97 (*)    Albumin 2.7 (*)    GFR calc non Af Amer 34 (*)    GFR calc Af Amer 39 (*)    All other components within normal limits  GLUCOSE, CAPILLARY - Abnormal; Notable for the following components:   Glucose-Capillary 162 (*)    All other components within normal limits  AMMONIA - Abnormal; Notable for the following components:   Ammonia 100 (*)    All other components within normal limits  APTT  TROPONIN I  TYPE AND SCREEN   ____________________________________________  EKG  ED ECG REPORT I, Lavonia Drafts, the attending physician, personally viewed and interpreted this ECG.  Date: 12/31/2017  Rhythm: A flutter QRS  Axis: normal Intervals: normal ST/T Wave abnormalities: normal Narrative Interpretation: no evidence of acute ischemia  ____________________________________________  RADIOLOGY  CT head unremarkable Chest x-ray unremarkable ____________________________________________   PROCEDURES  Procedure(s) performed: No  Procedures   Critical Care performed: yes  CRITICAL CARE Performed by: Lavonia Drafts   Total critical care time: 30 minutes  Critical care time was exclusive of separately billable procedures and treating other patients.  Critical care was necessary to treat or prevent imminent or life-threatening deterioration.  Critical care was time spent personally by me on the following activities: development of treatment plan with patient and/or surrogate as well as nursing, discussions with consultants, evaluation of patient's response to treatment, examination of patient, obtaining history from patient or surrogate, ordering and performing treatments and interventions, ordering and review of laboratory studies, ordering and review of radiographic studies, pulse oximetry and re-evaluation of patient's condition.  ____________________________________________   INITIAL IMPRESSION / ASSESSMENT AND PLAN / ED COURSE  Pertinent labs & imaging results that were available during my care of the patient were reviewed by me and considered in my medical decision making (see chart for details).  Patient presents with confusion, dizziness, weakness after a fall from home.  Seen yesterday, at that time hemoglobin was at baseline 7.1.  Today it is 6.6, this may be responsible for his dizziness and fall. Also he is quite confused, likely 2/2 to ammonia of 100/hepatic encephalopathy.   Will admit for further management    ____________________________________________   FINAL CLINICAL IMPRESSION(S) / ED DIAGNOSES  Final diagnoses:  Fall, initial encounter  Hepatic encephalopathy ([place])    Symptomatic anemia        Note:  This document was prepared using Dragon voice recognition software and may include unintentional dictation errors.    Lavonia Drafts, MD 12/31/17 1251    Lavonia Drafts, MD 01/11/18 [phone]

## 2017-12-31 NOTE — ED Notes (Signed)
Pt continues to repeat Corey West when asked birthday. Pt alert to place, name and why he is here. Continues to state is it "1918"

## 2017-12-31 NOTE — ED Notes (Signed)
Asked patient about blood transfusion and whether it was discussed with him. States "I'm thinking about it" in reference to getting blood. He has not made decision as to whether he wants to get blood or not at this time. Pt appears much more alert and this time. Oriented X 4. Follows commands.

## 2018-01-01 DIAGNOSIS — K7290 Hepatic failure, unspecified without coma: Secondary | ICD-10-CM | POA: Diagnosis not present

## 2018-01-01 LAB — BASIC METABOLIC PANEL
Anion gap: 5 (ref 5–15)
BUN: 29 mg/dL — ABNORMAL HIGH (ref 6–20)
CHLORIDE: 115 mmol/L — AB (ref 101–111)
CO2: 22 mmol/L (ref 22–32)
CREATININE: 1.95 mg/dL — AB (ref 0.61–1.24)
Calcium: 8.9 mg/dL (ref 8.9–10.3)
GFR calc non Af Amer: 34 mL/min — ABNORMAL LOW (ref >60)
GFR, EST AFRICAN AMERICAN: 40 mL/min — AB (ref >60)
Glucose, Bld: 184 mg/dL — ABNORMAL HIGH (ref 65–99)
Potassium: 3.7 mmol/L (ref 3.5–5.1)
Sodium: 142 mmol/L (ref 135–145)

## 2018-01-01 LAB — CBC
HEMATOCRIT: 22.3 % — AB (ref 40.0–52.0)
Hemoglobin: 7.3 g/dL — ABNORMAL LOW (ref 13.0–18.0)
MCH: 30.6 pg (ref 26.0–34.0)
MCHC: 32.7 g/dL (ref 32.0–36.0)
MCV: 93.5 fL (ref 80.0–100.0)
Platelets: 106 10*3/uL — ABNORMAL LOW (ref 150–440)
RBC: 2.39 MIL/uL — ABNORMAL LOW (ref 4.40–5.90)
RDW: 17.2 % — AB (ref 11.5–14.5)
WBC: 3.5 10*3/uL — ABNORMAL LOW (ref 3.8–10.6)

## 2018-01-01 LAB — TYPE AND SCREEN
ABO/RH(D): A NEG
Antibody Screen: NEGATIVE
UNIT DIVISION: 0

## 2018-01-01 LAB — AMMONIA: AMMONIA: 57 umol/L — AB (ref 9–35)

## 2018-01-01 LAB — BPAM RBC
Blood Product Expiration Date: 201904132359
ISSUE DATE / TIME: 201904022243
Unit Type and Rh: 600

## 2018-01-01 MED ORDER — POTASSIUM CHLORIDE CRYS ER 20 MEQ PO TBCR: 20.0000 meq | EXTENDED_RELEASE_TABLET | Freq: Two times a day (BID) | ORAL | Status: DC

## 2018-01-01 MED ORDER — LACTULOSE 10 GM/15ML PO SOLN: 20.0000 g | Freq: Four times a day (QID) | ORAL | Status: DC

## 2018-01-01 MED ORDER — RIFAXIMIN 550 MG PO TABS: 550.0000 mg | ORAL_TABLET | Freq: Two times a day (BID) | ORAL | 0 refills | Status: DC

## 2018-01-01 NOTE — Care Management Note (Signed)
Case Management Note  Patient Details  Name: MATTOX SCHORR MRN: [id number] Date of Birth: Aug 05, 1951  Subjective/Objective:  Discharging today                  Action/Plan: [facility] notified of discharge. Will resume home health [place] with [facility].   Expected Discharge Date:  01/01/18               Expected Discharge Plan:  [place]  In-House Referral:     Discharge planning Services  CM Consult  Post Acute Care Choice:  Resumption of Svcs/PTA Provider Choice offered to:     DME Arranged:    DME Agency:     HH Arranged:  RN, PT, Nurse's Aide, Social Work [employer] Agency:  [facility]  Status of Service:  Completed, signed off  If discussed at [employer], dates discussed:    Additional Comments:  Jolly Mango, RN 01/01/2018, 8:54 AM

## 2018-01-01 NOTE — Discharge Summary (Signed)
[facility] [place] NAME: Corey West    MR#:  [id number]  DATE OF BIRTH:  August 06, 1951  DATE OF ADMISSION:  12/31/2017 ADMITTING PHYSICIAN: Saundra Shelling, MD  DATE OF DISCHARGE: 01/01/2018  PRIMARY CARE PHYSICIAN: Baxter Hire, MD    ADMISSION DIAGNOSIS:  Hepatic encephalopathy ([place]) [K72.90] Fall, initial encounter [W19.XXXA] Symptomatic anemia [D64.9]  DISCHARGE DIAGNOSIS:  Active Problems:   Hepatic encephalopathy ([place])   SECONDARY DIAGNOSIS:   Past Medical History:  Diagnosis Date  . Anemia   . Anxiety    controlled;   . Arthritis   . AVM (arteriovenous malformation) of stomach, acquired with hemorrhage   . Barrett's esophagus   . Chronic kidney disease    renal infufficiency  . Cirrhosis ([place])   . Depression    controlled;   Marland Kitchen Diabetes mellitus without complication ([place])    not controlled, taking insulin but sugar continues to run high;   . Edema   . Esophageal varices ([place])   . GERD (gastroesophageal reflux disease)   . History of hiatal hernia   . Hyperlipidemia   . Hypertension    controlled well;   . 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 Obesity   . Pancytopenia ([place])   . Polyp, stomach    with chronic blood loss  . Sleep apnea    does not wear a cpap, Medicare would not pay for it   . Venous stasis dermatitis of both lower extremities   . Venous stasis of both lower extremities    with cellulitis    HOSPITAL COURSE:   1.  Hepatic encephalopathy.  Patient was started on lactulose and had good response with bowel movements.  Patient's mental status is better today.  Add Xifaxan to go home with. 2.  Acute on chronic blood loss anemia.  Patient was transfused 1 unit of packed red blood cells yesterday.  Hemoglobin still on the lower side.  Patient states he will set up transfusions as outpatient. 3.  Chronic gastrointestinal bleeding.  Patient had AVMs on last endoscopy.  With set up with Dr. Heide Guile for double  balloon enteroscopy today.  I spoke with scheduling and they were able to reschedule for later this afternoon.  Discharge today for procedure at [facility] this afternoon.  Patient medically needs this procedure to avoid his frequent hospitalizations. 4.  History of cirrhosis of the liver 5.  Morbid obesity 6.  Type 2 diabetes mellitus on 7030 insulin 7.  Hypokalemia and hypomagnesemia.  Patient on chronic electrolyte replacement 8.  Chronic kidney disease stage III   9.  Chronic venous stasis bilateral lower extremity  Dr. Vira Agar the patient's gastroenterologist recommends this procedure by Dr. Heide Guile at [facility].  Procedure must be done to try to reduce the patient's hospitalizations with bleeding.  I advised the patient needs to be at [facility] by 2 PM in order to get procedure this afternoon.  DISCHARGE CONDITIONS:   Satisfactory  CONSULTS OBTAINED:  Dr. Vira Agar gastroenterology  DRUG ALLERGIES:  No Known Allergies  DISCHARGE MEDICATIONS:   Allergies as of 01/01/2018   No Known Allergies     Medication List    STOP taking these medications   cephALEXin 500 MG capsule Commonly known as:  KEFLEX     TAKE these medications   CENTRUM SILVER 50+MEN Tabs Take 1 tablet by mouth 2 (two) times daily.   FERROUS SULFATE ER PO Take 325 mg by mouth 2 (two) times daily.  furosemide 40 MG tablet Commonly known as:  LASIX Take 40 mg by mouth 2 (two) times daily.   insulin aspart protamine- aspart (70-30) 100 UNIT/ML injection Commonly known as:  NOVOLOG MIX 70/30 Inject 0.2 mLs (20 Units total) into the skin 2 (two) times daily with a meal. What changed:    how much to take  how to take this  when to take this  additional instructions   lactulose 10 GM/15ML solution Commonly known as:  CHRONULAC Take 30 mLs (20 g total) by mouth 4 (four) times daily.   LORazepam 0.5 MG tablet Commonly known as:  ATIVAN Take 0.5 mg by mouth 3 (three) times daily as needed for anxiety.   Magnesium  Oxide 400 (240 Mg) MG Tabs Take 1 tablet (400 mg total) by mouth every morning.   pantoprazole 40 MG tablet Commonly known as:  PROTONIX Take 1 tablet (40 mg total) by mouth daily.   potassium chloride SA 20 MEQ tablet Commonly known as:  K-DUR,KLOR-CON Take 1 tablet (20 mEq total) by mouth 2 (two) times daily.   rifaximin 550 MG Tabs tablet Commonly known as:  XIFAXAN Take 1 tablet (550 mg total) by mouth 2 (two) times daily.   spironolactone 25 MG tablet Commonly known as:  ALDACTONE Take 1 tablet (25 mg total) by mouth 2 (two) times daily.   traMADol 50 MG tablet Commonly known as:  ULTRAM Take 50 mg by mouth every 8 (eight) hours as needed.        DISCHARGE INSTRUCTIONS:   Follow-up PMD 1 week, follow-up Dr. Vira Agar 1 week Follow-up at [facility] for procedure this afternoon  If you experience worsening of your admission symptoms, develop shortness of breath, life threatening emergency, suicidal or homicidal thoughts you must seek medical attention immediately by calling 911 or calling your MD immediately  if symptoms less severe.  You Must read complete instructions/literature along with all the possible adverse reactions/side effects for all the Medicines you take and that have been prescribed to you. Take any new Medicines after you have completely understood and accept all the possible adverse reactions/side effects.   Please note  You were cared for by a hospitalist during your hospital stay. If you have any questions about your discharge medications or the care you received while you were in the hospital after you are discharged, you can call the unit and asked to speak with the hospitalist on call if the hospitalist that took care of you is not available. Once you are discharged, your primary care physician will handle any further medical issues. Please note that NO REFILLS for any discharge medications will be authorized once you are discharged, as it is imperative that  you return to your primary care physician (or establish a relationship with a primary care physician if you do not have one) for your aftercare needs so that they can reassess your need for medications and monitor your lab values.    Today   CHIEF COMPLAINT:   Chief Complaint  Patient presents with  . Fall    HISTORY OF PRESENT ILLNESS:  Corey West  is a 68 y.o. male with a known history of liver cirrhosis and hepatic encephalopathy presented with fall and altered mental status.  Patient was given the lactulose and mental status has improved.   VITAL SIGNS:  Blood pressure (!) 137/53, pulse 73, temperature 97.8 F (36.6 C), temperature source Oral, resp. rate 19, height 5\' 9"  (1.753 m), weight (!) 140.6 kg (310  lb), SpO2 100 %.    PHYSICAL EXAMINATION:  GENERAL:  67 y.o.-year-old patient lying in the bed with no acute distress.  EYES: Pupils equal, round, reactive to light and accommodation. No scleral icterus. Extraocular muscles intact.  HEENT: Head atraumatic, normocephalic. Oropharynx and nasopharynx clear.  NECK:  Supple, no jugular venous distention. No thyroid enlargement, no tenderness.  LUNGS: Normal breath sounds bilaterally, no wheezing, rales,rhonchi or crepitation. No use of accessory muscles of respiration.  CARDIOVASCULAR: S1, S2 normal. No murmurs, rubs, or gallops.  ABDOMEN: Soft, non-tender, distended. Bowel sounds present. No organomegaly or mass.  EXTREMITIES: 3+ pedal edema, no cyanosis, or clubbing.  NEUROLOGIC: Cranial nerves II through XII are intact. Muscle strength 5/5 in all extremities. Sensation intact. Gait not checked.  PSYCHIATRIC: The patient is alert and oriented x 3.  SKIN: No obvious rash, lesion, or ulcer.   DATA REVIEW:   CBC Recent Labs  Lab 01/01/18 0458  WBC 3.5*  HGB 7.3*  HCT 22.3*  PLT 106*    Chemistries  Recent Labs  Lab 12/31/17 1033 01/01/18 0458  NA 139 142  K 4.5 3.7  CL 110 115*  CO2 22 22  GLUCOSE 171* 184*   BUN 33* 29*  CREATININE 1.97* 1.95*  CALCIUM 8.9 8.9  AST 32  --   ALT 19  --   ALKPHOS 61  --   BILITOT 0.5  --     Cardiac Enzymes Recent Labs  Lab 12/31/17 1033  TROPONINI <0.03    RADIOLOGY:  Ct Head Wo Contrast  Result Date: 12/31/2017 CLINICAL DATA:  Fall, mental status changes EXAM: CT HEAD WITHOUT CONTRAST TECHNIQUE: Contiguous axial images were obtained from the base of the skull through the vertex without intravenous contrast. Sagittal and coronal MPR images reconstructed from axial data set. COMPARISON:  12/15/2016 FINDINGS: Brain: Mild atrophy. Normal ventricular morphology. No midline shift or mass effect. Otherwise normal appearance of brain parenchyma. No intracranial hemorrhage, mass lesion, or evidence of acute infarction. No extra-axial fluid collections. Vascular: Atherosclerotic calcification of vertebral arteries and minimally LEFT internal carotid artery at skull base Skull: Intact Sinuses/Orbits: Clear Other: N/A IMPRESSION: Mild atrophy. No acute intracranial abnormalities. Electronically Signed   By: Lavonia Dana M.D.   On: 12/31/2017 11:33   Dg Chest Portable 1 View  Result Date: 12/31/2017 CLINICAL DATA:  Weakness EXAM: PORTABLE CHEST 1 VIEW COMPARISON:  12/25/2017 FINDINGS: Cardiac enlargement without heart failure. Lungs clear without infiltrate effusion. IMPRESSION: No active disease. Electronically Signed   By: Franchot Gallo M.D.   On: 12/31/2017 11:36     Management plans discussed with the patient, and he is in agreement.  CODE STATUS:     Code Status Orders  (From admission, onward)        Start     Ordered   12/31/17 1857  Full code  Continuous     12/31/17 1856    Code Status History    Date Active Date Inactive Code Status Order ID Comments User Context   12/25/2017 1456 12/26/2017 1618 Full Code [id number]  Epifanio Lesches, MD ED   12/16/2017 0921 12/18/2017 1635 DNR [id number]  Max Sane, MD Inpatient   12/16/2017 0305 12/16/2017 0921  Full Code [id number]  Salary, Avel Peace, MD ED   12/02/2017 2003 12/05/2017 2000 Full Code [id number]  Saundra Shelling, MD Inpatient   11/21/2017 1948 11/23/2017 1958 Full Code [id number]  Loletha Grayer, MD ED   10/23/2017 1405 10/26/2017 1402 Full Code [id number]  Mody, Sital,  MD Inpatient   10/18/2017 0058 10/19/2017 1806 Full Code [id number]  Saundra Shelling, MD Inpatient   10/09/2017 2139 10/11/2017 1806 Full Code [id number]  [name], MD Inpatient   09/03/2017 2124 09/05/2017 1552 Full Code [id number]  Nicholes Mango, MD Inpatient   06/26/2017 1119 06/28/2017 1748 Full Code [id number]  Henreitta Leber, MD Inpatient   02/21/2017 2007 02/22/2017 1803 Full Code [id number]  [name], MD Inpatient   07/20/2015 1245 07/22/2015 2217 Full Code [id number]  Nicholes Mango, MD Inpatient   02/08/2015 2331 02/11/2015 1943 Full Code [id number]  Hower, Aaron Mose, MD ED      TOTAL TIME TAKING CARE OF THIS PATIENT: 35 minutes.    Loletha Grayer M.D on 01/01/2018 at 8:07 AM  Between 7am to 6pm - Pager - [phone]  After 6pm go to [email or web address] - password [email or web address] [facility]  [phone]  CC: Primary care physician; Baxter Hire, MD

## 2018-01-01 NOTE — Progress Notes (Signed)
Discharge instructions and prescriptions given to pt. IV removed. Pt dressed, ride is here. Pt ready to be discharged home with nephew.

## 2018-01-02 DIAGNOSIS — K31819 Angiodysplasia of stomach and duodenum without bleeding: Secondary | ICD-10-CM | POA: Insufficient documentation

## 2018-01-02 DIAGNOSIS — N183 Chronic kidney disease, stage 3 (moderate): Secondary | ICD-10-CM

## 2018-01-02 DIAGNOSIS — N189 Chronic kidney disease, unspecified: Secondary | ICD-10-CM | POA: Insufficient documentation

## 2018-01-07 MED ORDER — HEPARIN SODIUM (PORCINE) 5000 UNIT/ML IJ SOLN
5000.00 | INTRAMUSCULAR | Status: DC
Start: 2018-01-07 — End: 2018-01-07

## 2018-01-07 MED ORDER — SPIRONOLACTONE 25 MG PO TABS
25.00 | ORAL_TABLET | ORAL | Status: DC
Start: 2018-01-07 — End: 2018-01-07

## 2018-01-07 MED ORDER — SERTRALINE HCL 50 MG PO TABS
50.00 | ORAL_TABLET | ORAL | Status: DC
Start: 2018-01-08 — End: 2018-01-07

## 2018-01-07 MED ORDER — GLUCAGON HCL RDNA (DIAGNOSTIC) 1 MG IJ SOLR
1.00 | INTRAMUSCULAR | Status: DC
Start: ? — End: 2018-01-07

## 2018-01-07 MED ORDER — MAGNESIUM OXIDE 400 MG PO TABS
400.00 | ORAL_TABLET | ORAL | Status: DC
Start: 2018-01-08 — End: 2018-01-07

## 2018-01-07 MED ORDER — TRAMADOL HCL 50 MG PO TABS
50.00 | ORAL_TABLET | ORAL | Status: DC
Start: ? — End: 2018-01-07

## 2018-01-07 MED ORDER — LIDOCAINE HCL 1 % IJ SOLN
.50 | INTRAMUSCULAR | Status: DC
Start: ? — End: 2018-01-07

## 2018-01-07 MED ORDER — INSULIN LISPRO 100 UNIT/ML ~~LOC~~ SOLN
.00 | SUBCUTANEOUS | Status: DC
Start: 2018-01-07 — End: 2018-01-07

## 2018-01-07 MED ORDER — PANTOPRAZOLE SODIUM 40 MG PO TBEC
40.00 | DELAYED_RELEASE_TABLET | ORAL | Status: DC
Start: 2018-01-08 — End: 2018-01-07

## 2018-01-07 MED ORDER — INSULIN LISPRO 100 UNIT/ML ~~LOC~~ SOLN
7.00 | SUBCUTANEOUS | Status: DC
Start: 2018-01-08 — End: 2018-01-07

## 2018-01-07 MED ORDER — INSULIN LISPRO 100 UNIT/ML ~~LOC~~ SOLN
7.00 | SUBCUTANEOUS | Status: DC
Start: 2018-01-07 — End: 2018-01-07

## 2018-01-07 MED ORDER — POTASSIUM CHLORIDE ER 20 MEQ PO TBCR
20.00 | EXTENDED_RELEASE_TABLET | ORAL | Status: DC
Start: 2018-01-08 — End: 2018-01-07

## 2018-01-07 MED ORDER — LORAZEPAM 0.5 MG PO TABS
.50 | ORAL_TABLET | ORAL | Status: DC
Start: ? — End: 2018-01-07

## 2018-01-07 MED ORDER — LACTULOSE 10 GM/15ML PO SOLN
30.00 | ORAL | Status: DC
Start: ? — End: 2018-01-07

## 2018-01-07 MED ORDER — LACTULOSE 10 GM/15ML PO SOLN
30.00 | ORAL | Status: DC
Start: 2018-01-07 — End: 2018-01-07

## 2018-01-07 MED ORDER — SUPER THERA VITE M PO TABS
1.00 | ORAL_TABLET | ORAL | Status: DC
Start: 2018-01-08 — End: 2018-01-07

## 2018-01-07 MED ORDER — INSULIN GLARGINE 100 UNIT/ML ~~LOC~~ SOLN
30.00 | SUBCUTANEOUS | Status: DC
Start: 2018-01-07 — End: 2018-01-07

## 2018-01-07 MED ORDER — DEXTROSE 50 % IV SOLN
12.50 | INTRAVENOUS | Status: DC
Start: ? — End: 2018-01-07

## 2018-01-07 MED ORDER — POLYETHYLENE GLYCOL 3350 17 G PO PACK
17.00 | PACK | ORAL | Status: DC
Start: 2018-01-08 — End: 2018-01-07

## 2018-01-07 MED ORDER — ONDANSETRON HCL 4 MG/2ML IJ SOLN
4.00 | INTRAMUSCULAR | Status: DC
Start: ? — End: 2018-01-07

## 2018-01-07 MED ORDER — ACETAMINOPHEN 325 MG PO TABS
650.00 | ORAL_TABLET | ORAL | Status: DC
Start: 2018-01-07 — End: 2018-01-07

## 2018-01-07 MED ORDER — FUROSEMIDE 40 MG PO TABS
40.00 | ORAL_TABLET | ORAL | Status: DC
Start: 2018-01-07 — End: 2018-01-07

## 2018-01-07 MED ORDER — RIFAXIMIN 550 MG PO TABS
550.00 | ORAL_TABLET | ORAL | Status: DC
Start: 2018-01-07 — End: 2018-01-07

## 2018-03-16 NOTE — Progress Notes (Signed)
[facility]  [phone] [phone]  ID: Corey West OB: Feb 27, 1951  MR#: [id number]  CXK#:[id number]  Patient Care Team: Baxter Hire, MD as PCP - General (Internal Medicine)  CHIEF COMPLAINT: Iron deficiency anemia, thrombocytopenia  INTERVAL HISTORY: Patient returns to clinic today for repeat laboratory work and further evaluation.  He continues to improve and is now back to his baseline.  He denies any further GI bleeds. He has no neurologic complaints.  He denies any recent fevers or illnesses.  He has a fair appetite, but denies weight loss.  He denies any chest pain or hemoptysis.  He denies any nausea, vomiting, constipation, or diarrhea.    He has no melena or hematochezia. He has no urinary complaints.    Patient offers no specific complaints today.  REVIEW OF SYSTEMS:   Review of Systems  Constitutional: Negative.  Negative for fever, malaise/fatigue and weight loss.  Respiratory: Negative.  Negative for cough, hemoptysis and shortness of breath.   Cardiovascular: Negative.  Negative for chest pain and leg swelling.  Gastrointestinal: Negative.  Negative for abdominal pain, blood in stool and melena.  Genitourinary: Negative.  Negative for hematuria.  Musculoskeletal: Negative.  Negative for back pain.  Skin: Negative.   Neurological: Negative.  Negative for sensory change, focal weakness, weakness and headaches.  Psychiatric/Behavioral: Negative.  The patient is not nervous/anxious.     As per HPI. Otherwise, a complete review of systems is negative.  PAST MEDICAL HISTORY: Past Medical History:  Diagnosis Date  . Anemia   . Anxiety    controlled;   . Arthritis   . AVM (arteriovenous malformation) of stomach, acquired with hemorrhage   . Barrett's esophagus   . Chronic kidney disease    renal infufficiency  . Cirrhosis ([place])   . Depression    controlled;   Marland Kitchen Diabetes mellitus without complication ([place])    not controlled,  taking insulin but sugar continues to run high;   . Edema   . Esophageal varices ([place])   . GERD (gastroesophageal reflux disease)   . History of hiatal hernia   . Hyperlipidemia   . Hypertension    controlled well;   . 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 Obesity   . Pancytopenia ([place])   . Polyp, stomach    with chronic blood loss  . Sleep apnea    does not wear a cpap, Medicare would not pay for it   . Venous stasis dermatitis of both lower extremities   . Venous stasis of both lower extremities    with cellulitis    PAST SURGICAL HISTORY: Past Surgical History:  Procedure Laterality Date  . ESOPHAGOGASTRODUODENOSCOPY N/A 02/09/2015   Procedure: ESOPHAGOGASTRODUODENOSCOPY (EGD);  Surgeon: Manya Silvas, MD;  Location: [facility] ENDOSCOPY;  Service: Endoscopy;  Laterality: N/A;  . ESOPHAGOGASTRODUODENOSCOPY N/A 07/22/2015   Procedure: ESOPHAGOGASTRODUODENOSCOPY (EGD);  Surgeon: Manya Silvas, MD;  Location: [facility] ENDOSCOPY;  Service: Endoscopy;  Laterality: N/A;  . ESOPHAGOGASTRODUODENOSCOPY N/A 06/26/2017   Procedure: ESOPHAGOGASTRODUODENOSCOPY (EGD);  Surgeon: Manya Silvas, MD;  Location: [facility] ENDOSCOPY;  Service: Endoscopy;  Laterality: N/A;  . ESOPHAGOGASTRODUODENOSCOPY N/A 06/27/2017   Procedure: ESOPHAGOGASTRODUODENOSCOPY (EGD);  Surgeon: Manya Silvas, MD;  Location: [facility] ENDOSCOPY;  Service: Endoscopy;  Laterality: N/A;  . ESOPHAGOGASTRODUODENOSCOPY N/A 06/28/2017   Procedure: ESOPHAGOGASTRODUODENOSCOPY (EGD);  Surgeon: Manya Silvas, MD;  Location: [facility] ENDOSCOPY;  Service: Endoscopy;  Laterality: N/A;  . ESOPHAGOGASTRODUODENOSCOPY Bilateral 10/11/2017   Procedure: ESOPHAGOGASTRODUODENOSCOPY (EGD);  Surgeon: Vira Agar,  Gavin Pound, MD;  Location: [facility] ENDOSCOPY;  Service: Endoscopy;  Laterality: Bilateral;  . ESOPHAGOGASTRODUODENOSCOPY N/A 12/04/2017   Procedure: ESOPHAGOGASTRODUODENOSCOPY (EGD);  Surgeon: Manya Silvas, MD;  Location: [facility] ENDOSCOPY;  Service: Endoscopy;   Laterality: N/A;  . ESOPHAGOGASTRODUODENOSCOPY (EGD) WITH PROPOFOL N/A 07/20/2015   Procedure: ESOPHAGOGASTRODUODENOSCOPY (EGD) WITH PROPOFOL;  Surgeon: Manya Silvas, MD;  Location: [facility] ENDOSCOPY;  Service: Endoscopy;  Laterality: N/A;  . ESOPHAGOGASTRODUODENOSCOPY (EGD) WITH PROPOFOL N/A 09/16/2015   Procedure: ESOPHAGOGASTRODUODENOSCOPY (EGD) WITH PROPOFOL;  Surgeon: Manya Silvas, MD;  Location: [facility] ENDOSCOPY;  Service: Endoscopy;  Laterality: N/A;  . ESOPHAGOGASTRODUODENOSCOPY (EGD) WITH PROPOFOL N/A 03/16/2016   Procedure: ESOPHAGOGASTRODUODENOSCOPY (EGD) WITH PROPOFOL;  Surgeon: Manya Silvas, MD;  Location: [facility] ENDOSCOPY;  Service: Endoscopy;  Laterality: N/A;  . ESOPHAGOGASTRODUODENOSCOPY (EGD) WITH PROPOFOL N/A 09/14/2016   Procedure: ESOPHAGOGASTRODUODENOSCOPY (EGD) WITH PROPOFOL;  Surgeon: Manya Silvas, MD;  Location: [facility] ENDOSCOPY;  Service: Endoscopy;  Laterality: N/A;  . ESOPHAGOGASTRODUODENOSCOPY (EGD) WITH PROPOFOL N/A 11/05/2016   Procedure: ESOPHAGOGASTRODUODENOSCOPY (EGD) WITH PROPOFOL;  Surgeon: Manya Silvas, MD;  Location: [facility] ENDOSCOPY;  Service: Endoscopy;  Laterality: N/A;  . ESOPHAGOGASTRODUODENOSCOPY (EGD) WITH PROPOFOL N/A 02/06/2017   Procedure: ESOPHAGOGASTRODUODENOSCOPY (EGD) WITH PROPOFOL;  Surgeon: Manya Silvas, MD;  Location: [facility] ENDOSCOPY;  Service: Endoscopy;  Laterality: N/A;  . ESOPHAGOGASTRODUODENOSCOPY (EGD) WITH PROPOFOL N/A 04/24/2017   Procedure: ESOPHAGOGASTRODUODENOSCOPY (EGD) WITH PROPOFOL;  Surgeon: Manya Silvas, MD;  Location: [facility] ENDOSCOPY;  Service: Endoscopy;  Laterality: N/A;  . ESOPHAGOGASTRODUODENOSCOPY (EGD) WITH PROPOFOL N/A 07/31/2017   Procedure: ESOPHAGOGASTRODUODENOSCOPY (EGD) WITH PROPOFOL;  Surgeon: Manya Silvas, MD;  Location: [facility] ENDOSCOPY;  Service: Endoscopy;  Laterality: N/A;  . GIVENS CAPSULE STUDY N/A 12/05/2017   Procedure: GIVENS CAPSULE STUDY;  Surgeon: Manya Silvas, MD;  Location: [facility]  ENDOSCOPY;  Service: Endoscopy;  Laterality: N/A;  . TONSILLECTOMY    . TONSILLECTOMY AND ADENOIDECTOMY    . ULNAR NERVE TRANSPOSITION    . UVULOPALATOPHARYNGOPLASTY      FAMILY HISTORY: Family History  Problem Relation Age of Onset  . Diabetes Other   . Transient ischemic attack Father   . CAD Father     ADVANCED DIRECTIVES (Y/N):  N  HEALTH MAINTENANCE: Social History   Tobacco Use  . Smoking status: Current Some Day Smoker    Types: Cigars  . Smokeless tobacco: Never Used  . Tobacco comment: smoke 1 at night occasionally;   Substance Use Topics  . Alcohol use: No    Comment: stopped 12 years ago  . Drug use: No     Colonoscopy:  PAP:  Bone density:  Lipid panel:  No Known Allergies  Current Outpatient Medications  Medication Sig Dispense Refill  . furosemide (LASIX) 40 MG tablet Take 40 mg by mouth 2 (two) times daily.    Marland Kitchen glucose blood (CONTOUR NEXT TEST) test strip USE TWICE A DAY AS INSTRUCTED    . insulin aspart protamine- aspart (NOVOLOG MIX 70/30) (70-30) 100 UNIT/ML injection Inject 0.2 mLs (20 Units total) into the skin 2 (two) times daily with a meal. (Patient taking differently: Inject 84u every morning and 65u every evening) 10 mL 11  . lactulose (CHRONULAC) 10 GM/15ML solution Take 30 mLs (20 g total) by mouth 4 (four) times daily.    . metolazone (ZAROXOLYN) 2.5 MG tablet     . Multiple Vitamins-Minerals (CENTRUM SILVER 50+MEN) TABS Take 1 tablet by mouth 2 (two) times daily.     . potassium chloride [place] (  K-DUR,KLOR-CON) 20 MEQ tablet Take 1 tablet (20 mEq total) by mouth 2 (two) times daily.    . ranitidine (ZANTAC) 150 MG tablet     . rifaximin (XIFAXAN) 550 MG TABS tablet Take 1 tablet (550 mg total) by mouth 2 (two) times daily. 60 tablet 0  . traMADol (ULTRAM) 50 MG tablet Take 50 mg by mouth every 8 (eight) hours as needed.     Marland Kitchen FERROUS SULFATE ER PO Take 325 mg by mouth 2 (two) times daily.     . insulin NPH-regular Human (HUMULIN 70/30)  (70-30) 100 UNIT/ML injection Inject into the skin.    Marland Kitchen LORazepam (ATIVAN) 0.5 MG tablet Take 0.5 mg by mouth 3 (three) times daily as needed for anxiety.     . Magnesium Oxide 400 (240 Mg) MG TABS Take 1 tablet (400 mg total) by mouth every morning. (Patient not taking: Reported on 03/17/2018) 30 tablet 0  . pantoprazole (PROTONIX) 40 MG tablet Take 1 tablet (40 mg total) by mouth daily. (Patient not taking: Reported on 03/17/2018) 30 tablet 0  . sertraline (ZOLOFT) 100 MG tablet     . spironolactone (ALDACTONE) 25 MG tablet Take 1 tablet (25 mg total) by mouth 2 (two) times daily. (Patient not taking: Reported on 03/17/2018) 60 tablet 0  . ULTICARE MINI PEN NEEDLES 31G X 6 MM MISC      No current facility-administered medications for this visit.     OBJECTIVE: Vitals:   03/17/18 1059  BP: (!) 147/73  Pulse: 77  Resp: 18  Temp: 97.6 F (36.4 C)     Body mass index is 39.65 kg/m.    ECOG FS:0 - Asymptomatic  General: Well-developed, well-nourished, no acute distress. Eyes: Pink conjunctiva, anicteric sclera. HEENT: Normocephalic, moist mucous membranes, clear oropharnyx. Lungs: Clear to auscultation bilaterally. Heart: Regular rate and rhythm. No rubs, murmurs, or gallops. Abdomen: Soft, nontender, nondistended. No organomegaly noted, normoactive bowel sounds. Musculoskeletal: No edema, cyanosis, or clubbing. Neuro: Alert, answering all questions appropriately. Cranial nerves grossly intact. Skin: No rashes or petechiae noted. Psych: Normal affect. Lymphatics: No cervical, calvicular, axillary or inguinal LAD.   LAB RESULTS:  Lab Results  Component Value Date   NA 142 01/01/2018   K 3.7 01/01/2018   CL 115 (H) 01/01/2018   CO2 22 01/01/2018   GLUCOSE 184 (H) 01/01/2018   BUN 29 (H) 01/01/2018   CREATININE 1.95 (H) 01/01/2018   CALCIUM 8.9 01/01/2018   PROT 6.6 12/31/2017   ALBUMIN 2.7 (L) 12/31/2017   AST 32 12/31/2017   ALT 19 12/31/2017   ALKPHOS 61 12/31/2017    BILITOT 0.5 12/31/2017   GFRNONAA 34 (L) 01/01/2018   GFRAA 40 (L) 01/01/2018    Lab Results  Component Value Date   WBC 5.7 03/17/2018   NEUTROABS 3.5 12/30/2017   HGB 9.8 (L) 03/17/2018   HCT 28.0 (L) 03/17/2018   MCV 89.0 03/17/2018   PLT 136 (L) 03/17/2018   Lab Results  Component Value Date   IRON 71 03/17/2018   TIBC 338 03/17/2018   IRONPCTSAT 21 03/17/2018   Lab Results  Component Value Date   FERRITIN 30 03/17/2018     STUDIES: No results found.  ASSESSMENT: Iron deficiency anemia, thrombocytopenia  PLAN:    1.  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    Patient's most recent EGD was completed on December 01, 2017.  His hemoglobin is decreased, but significantly improved.  His iron stores are within normal limits.  He has a normal folate and B12 level. His reticulocyte count is inappropriately normal, continue to monitor.  He does not require IV iron at this time.  He does not require bone marrow biopsy.  Return to clinic in 3 months with repeat laboratory work and further evaluation.   2.  Thrombocytopenia: Improved since his most recent blood draw in April 2019. Likely multifactorial including secondary to his known cirrhosis.  Patient's platelets appear to be at his baseline.  No intervention is needed.   I spent a total of 30 minutes face-to-face with the patient of which greater than 50% of the visit was spent in counseling and coordination of care as summarized above.  Patient expressed understanding and was in agreement with this plan. He also understands that He can call clinic at any time with any questions, concerns, or complaints.   Cancer Staging No matching staging information was found for the patient.  Lloyd Huger, MD   03/21/2018 6:27 PM

## 2018-03-17 ENCOUNTER — Other Ambulatory Visit: Payer: Self-pay

## 2018-03-17 ENCOUNTER — Inpatient Hospital Stay: Payer: Medicare Other | Attending: Oncology | Admitting: Oncology

## 2018-03-17 ENCOUNTER — Inpatient Hospital Stay: Payer: Medicare Other

## 2018-03-17 VITALS — BP 147/73 | HR 77 | Temp 97.6°F | Resp 18 | Wt 268.5 lb

## 2018-03-17 DIAGNOSIS — E119 Type 2 diabetes mellitus without complications: Secondary | ICD-10-CM | POA: Diagnosis not present

## 2018-03-17 DIAGNOSIS — I8500 Esophageal varices without bleeding: Secondary | ICD-10-CM | POA: Diagnosis not present

## 2018-03-17 DIAGNOSIS — D509 Iron deficiency anemia, unspecified: Secondary | ICD-10-CM | POA: Diagnosis present

## 2018-03-17 DIAGNOSIS — F17210 Nicotine dependence, cigarettes, uncomplicated: Secondary | ICD-10-CM | POA: Diagnosis not present

## 2018-03-17 DIAGNOSIS — I10 Essential (primary) hypertension: Secondary | ICD-10-CM | POA: Insufficient documentation

## 2018-03-17 DIAGNOSIS — D500 Iron deficiency anemia secondary to blood loss (chronic): Secondary | ICD-10-CM

## 2018-03-17 DIAGNOSIS — D696 Thrombocytopenia, unspecified: Secondary | ICD-10-CM | POA: Diagnosis not present

## 2018-03-17 DIAGNOSIS — Q2733 Arteriovenous malformation of digestive system vessel: Secondary | ICD-10-CM | POA: Insufficient documentation

## 2018-03-17 LAB — CBC
HCT: 28 % — ABNORMAL LOW (ref 40.0–52.0)
HEMOGLOBIN: 9.8 g/dL — AB (ref 13.0–18.0)
MCH: 31.1 pg (ref 26.0–34.0)
MCHC: 35 g/dL (ref 32.0–36.0)
MCV: 89 fL (ref 80.0–100.0)
PLATELETS: 136 10*3/uL — AB (ref 150–440)
RBC: 3.14 MIL/uL — ABNORMAL LOW (ref 4.40–5.90)
RDW: 14.9 % — ABNORMAL HIGH (ref 11.5–14.5)
WBC: 5.7 10*3/uL (ref 3.8–10.6)

## 2018-03-17 LAB — RETICULOCYTES
RBC.: 3.2 MIL/uL — ABNORMAL LOW (ref 4.40–5.90)
RETIC CT PCT: 2.2 % (ref 0.4–3.1)
Retic Count, Absolute: 70.4 10*3/uL (ref 19.0–183.0)

## 2018-03-17 LAB — IRON AND TIBC
IRON: 71 ug/dL (ref 45–182)
Saturation Ratios: 21 % (ref 17.9–39.5)
TIBC: 338 ug/dL (ref 250–450)
UIBC: 267 ug/dL

## 2018-03-17 LAB — FERRITIN: FERRITIN: 30 ng/mL (ref 24–336)

## 2018-03-17 LAB — FOLATE: Folate: 29 ng/mL (ref >5.9)

## 2018-03-17 LAB — VITAMIN B12: Vitamin B-12: 1375 pg/mL — ABNORMAL HIGH (ref 180–914)

## 2018-03-17 NOTE — Progress Notes (Signed)
Stated feeling overall" good " meds have changed and pt unclear w some meds- insurance refd cover reg insulin so pt stayed on same Novolog. Stated back to [facility] this WED  " they put bands around my stomach because I was bleeding " via endo he stated. Feeling stronger and better he stated.

## 2018-03-20 LAB — PLATELET ANTIBODY PROFILE
Glycoprotein IV Antibody: NEGATIVE
HLA Ab Ser Ql EIA: NEGATIVE
IA/IIA Antibody: NEGATIVE
IB/IX Antibody: NEGATIVE
IIB/IIIA Antibody: NEGATIVE

## 2018-04-25 ENCOUNTER — Other Ambulatory Visit
Admission: RE | Admit: 2018-04-25 | Discharge: 2018-04-25 | Disposition: A | Discharge status: Home / Self Care | Payer: Medicare Other | Source: Other Acute Inpatient Hospital | Attending: Unknown Physician Specialty | Admitting: Unknown Physician Specialty

## 2018-04-25 DIAGNOSIS — K7469 Other cirrhosis of liver: Secondary | ICD-10-CM | POA: Insufficient documentation

## 2018-04-25 LAB — AMMONIA: AMMONIA: 31 umol/L (ref 9–35)

## 2018-05-14 IMAGING — CR DG CHEST 1V PORT
1 series · 1 of 1 positions shown · non-contrast
Comparison: December 02, 2017

CLINICAL DATA: Confusion/altered mental status

EXAM:
PORTABLE CHEST 1 VIEW

[chest ap]
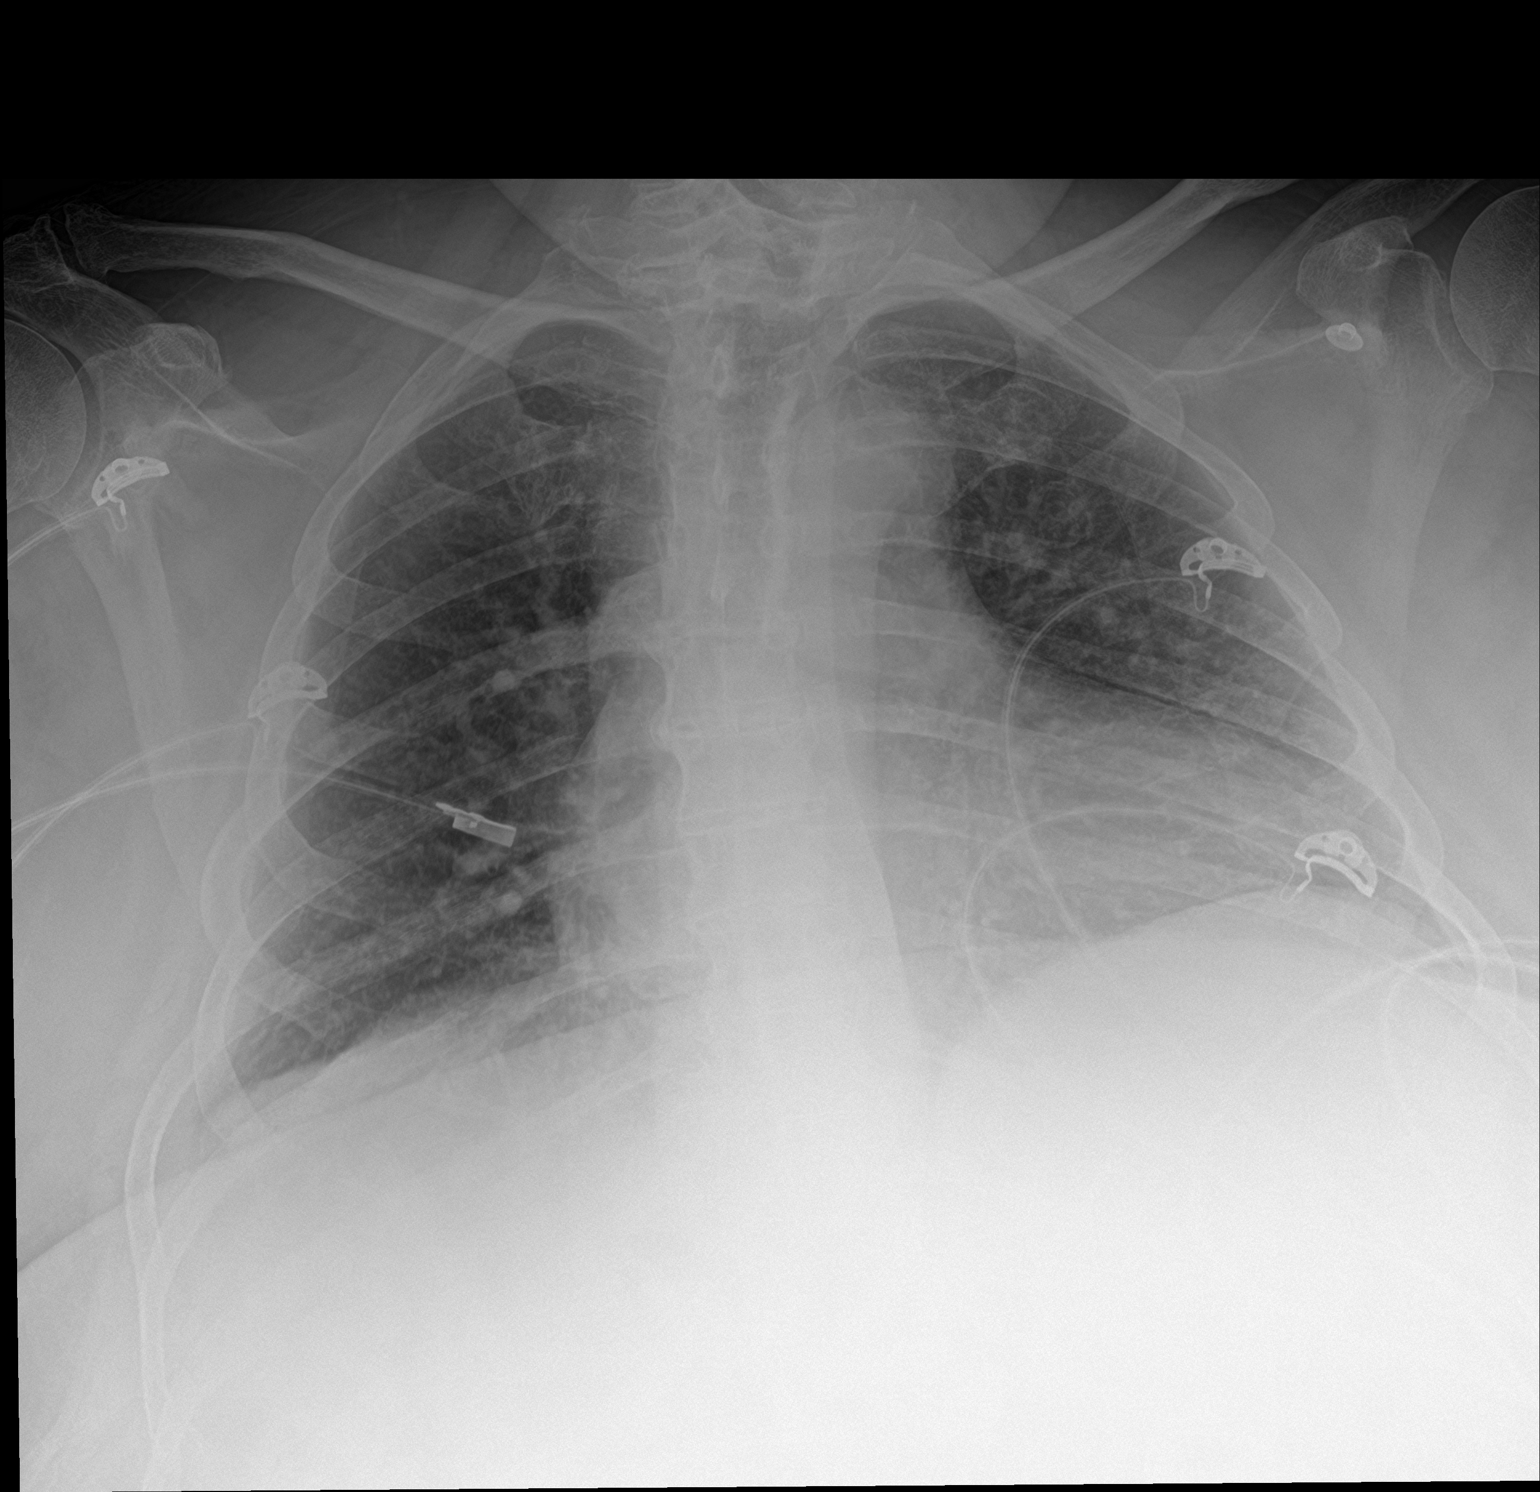

[1 of 1 positions shown; findings below may reference images not displayed]

FINDINGS: There is no edema or consolidation. Heart is mildly enlarged with
pulmonary vascularity within normal limits. No adenopathy. No bone
lesions. No pneumothorax.
IMPRESSION: Stable cardiac prominence.  No edema or consolidation.

## 2018-05-14 IMAGING — CT CT HEAD W/O CM
3 series · 16 of 47 positions shown, 19 images · non-contrast
Comparison: Head CT 10/17/2017

CLINICAL DATA: Altered level of consciousness.

EXAM:
CT HEAD WITHOUT CONTRAST
TECHNIQUE: Contiguous axial images were obtained from the base of the skull
through the vertex without intravenous contrast.

[Series 3: head wo · axial · 0.42mm/px · z∈[-257,-127]mm · 10 of 32 slices shown, 13 images]
[im 3/32  brain]
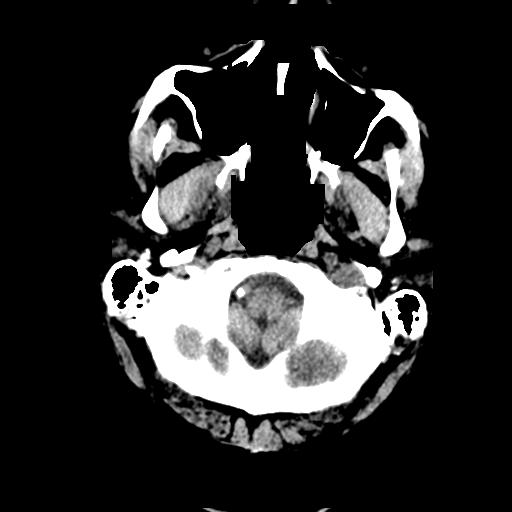
[im 3/32  bone]
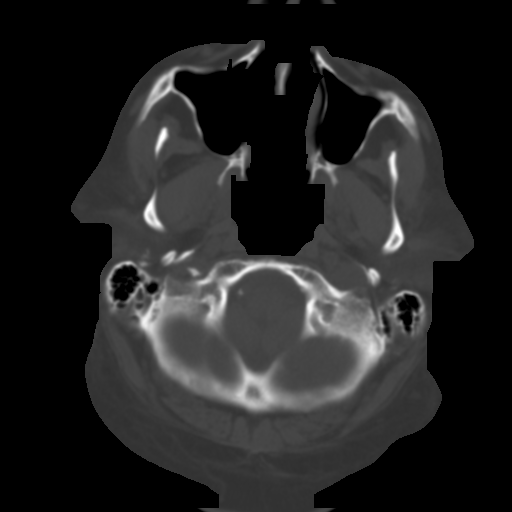
[im 6/32  brain]
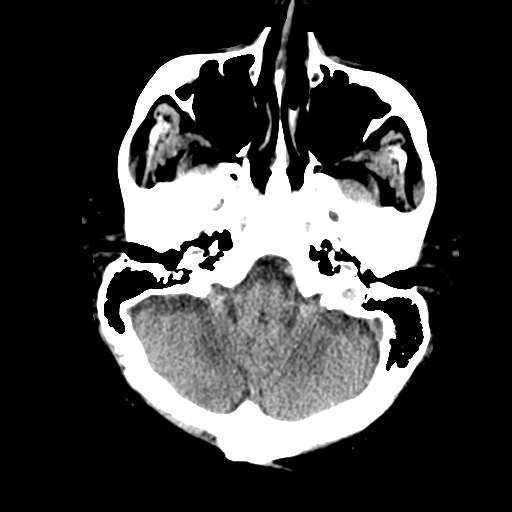
[im 9/32  brain]
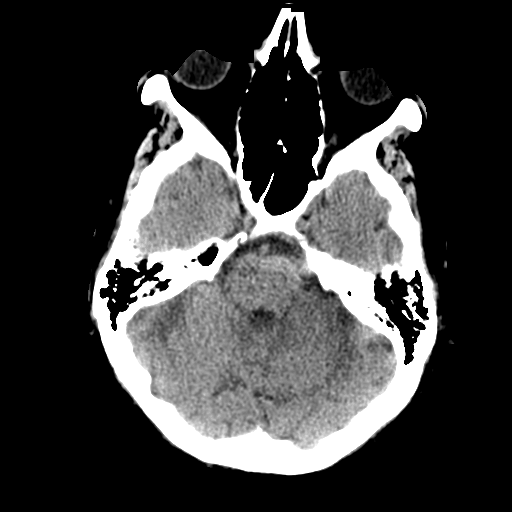
[im 11/32  brain]
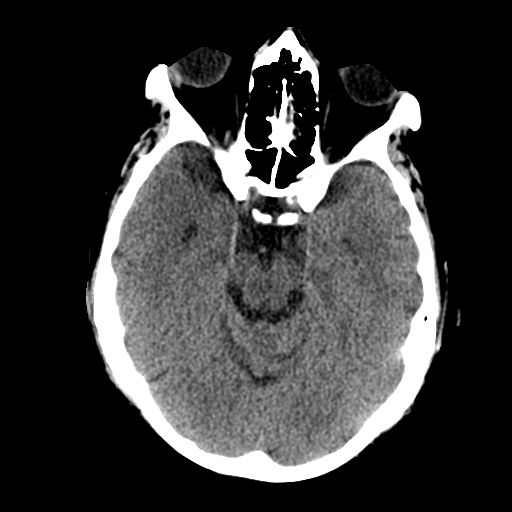
[im 14/32  brain]
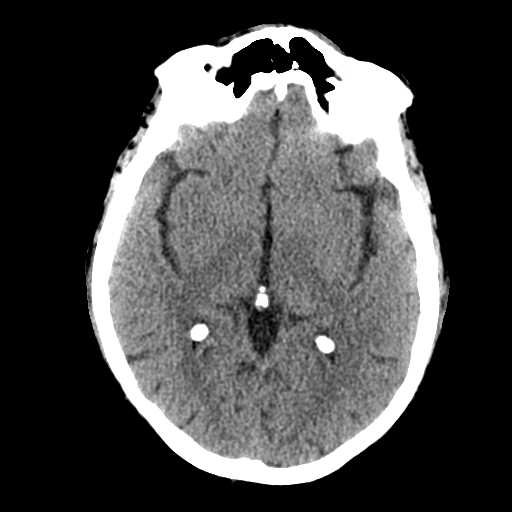
[im 14/32  bone]
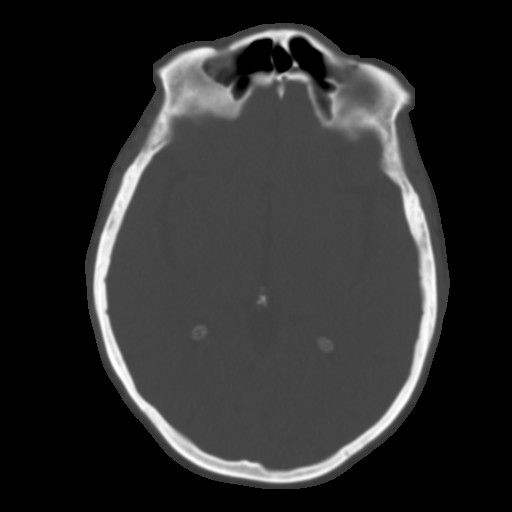
[im 18/32  brain]
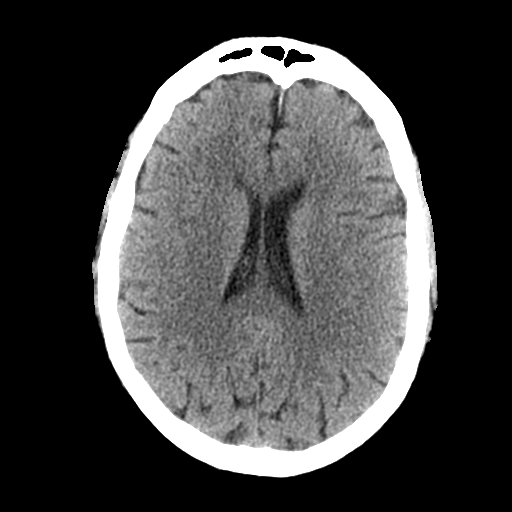
[im 21/32  brain]
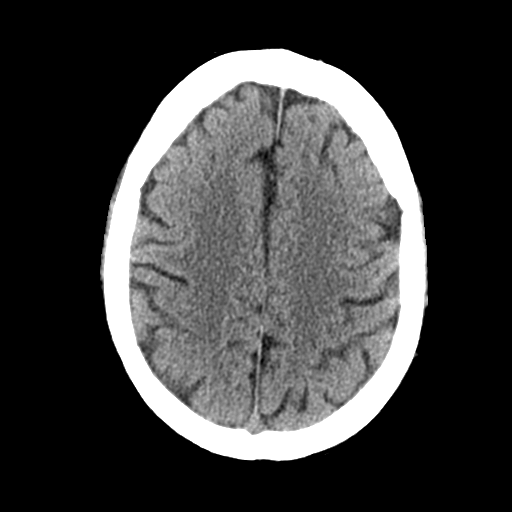
[im 24/32  brain]
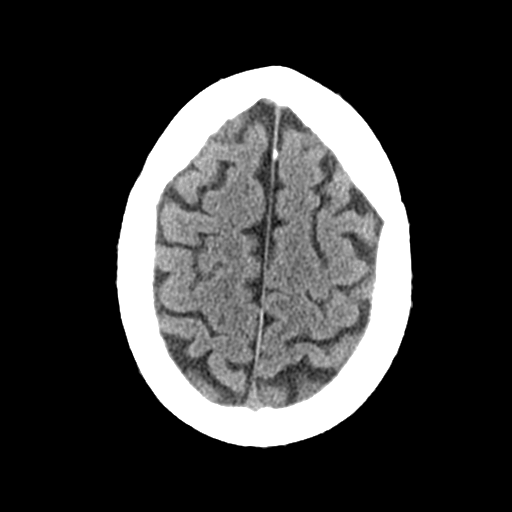
[im 26/32  brain]
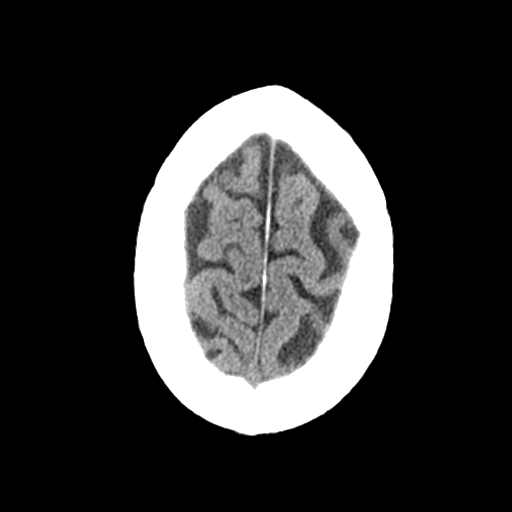
[im 26/32  bone]
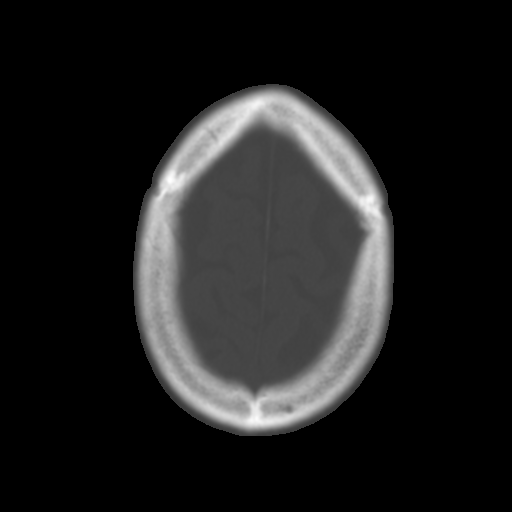
[im 29/32  brain]
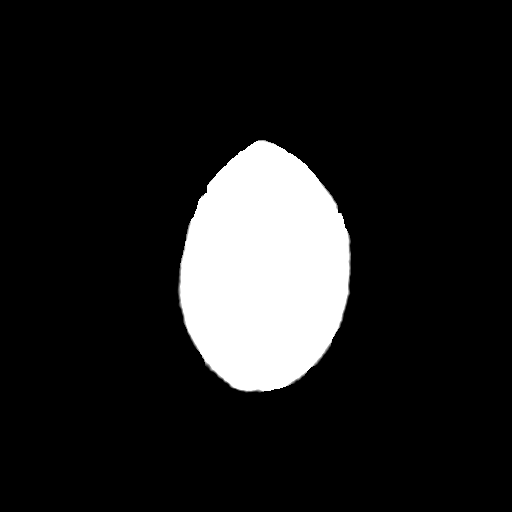

[Series 4: coronal soft tissue · coronal · 0.29mm/px · 3 of 67 slices shown]
[im 24/67  brain]
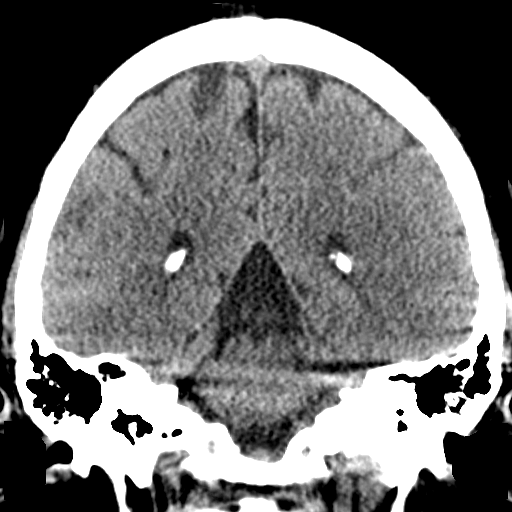
[im 30/67  brain]
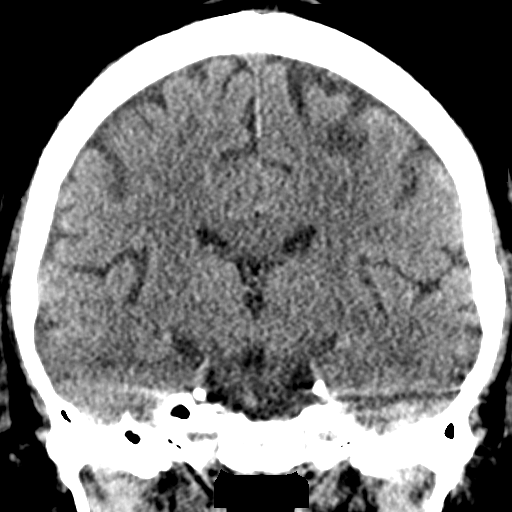
[im 37/67  brain]
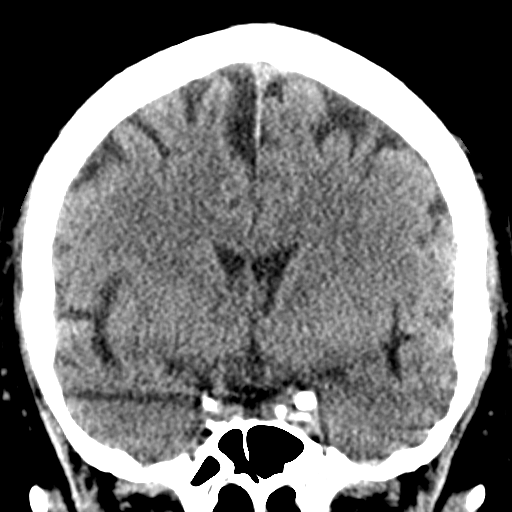

[Series 5: sagittal soft tissue · sagittal · 0.29mm/px · 3 of 51 slices shown]
[im 17/51  brain]
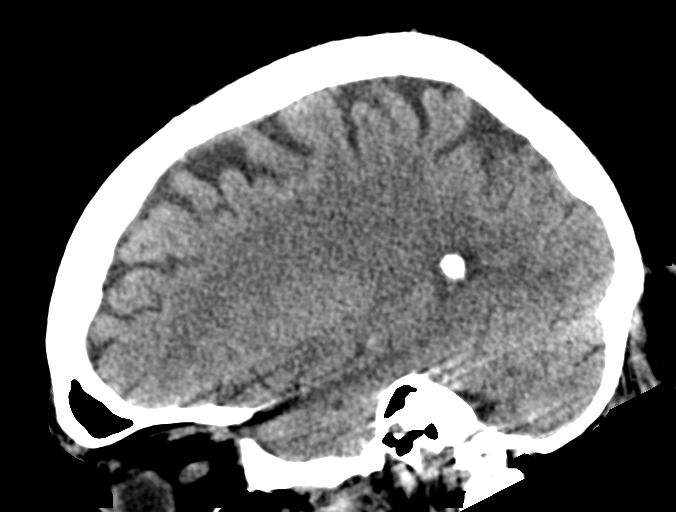
[im 26/51  brain]
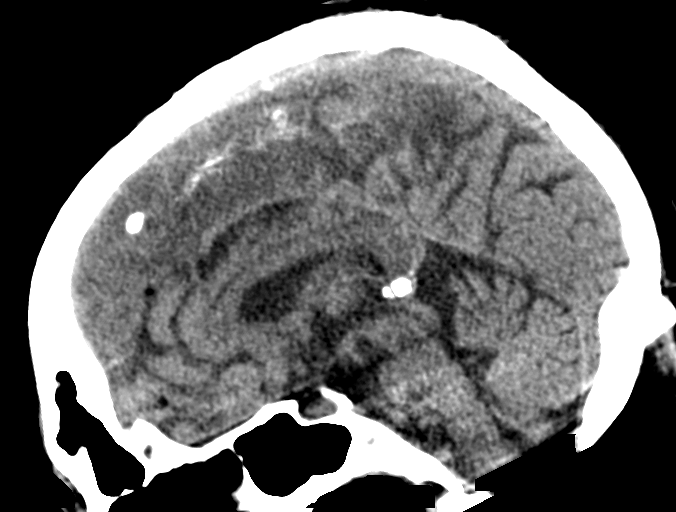
[im 34/51  brain]
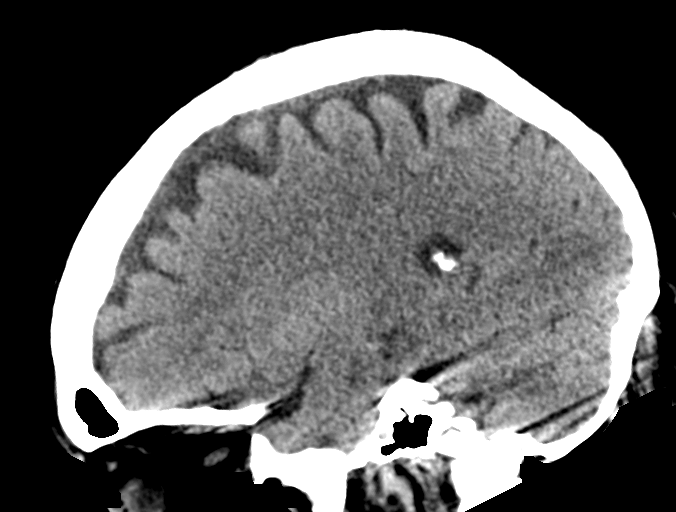

[16 of 47 positions shown; findings below may reference images not displayed]

FINDINGS: Brain: Mild atrophy is unchanged from prior exam. No intracranial
hemorrhage, mass effect, or midline shift. No hydrocephalus. The
basilar cisterns are patent. No evidence of territorial infarct or
acute ischemia. No extra-axial or intracranial fluid collection.

Vascular: No hyperdense vessel or unexpected calcification.

Skull: No fracture or focal lesion.

Sinuses/Orbits: Paranasal sinuses and mastoid air cells are clear.
The visualized orbits are unremarkable.

Other: None.
IMPRESSION: 1.  No acute intracranial abnormality.
2. Mild atrophy is unchanged from prior.

## 2018-06-18 ENCOUNTER — Encounter: Payer: Self-pay | Admitting: Emergency Medicine

## 2018-06-18 ENCOUNTER — Emergency Department: Payer: Medicare Other

## 2018-06-18 ENCOUNTER — Observation Stay
Admission: EM | Admit: 2018-06-18 | Discharge: 2018-06-19 | Disposition: A | Discharge status: Home / Self Care | Payer: Medicare Other | Attending: Internal Medicine | Admitting: Internal Medicine

## 2018-06-18 ENCOUNTER — Other Ambulatory Visit: Payer: Self-pay

## 2018-06-18 DIAGNOSIS — I8500 Esophageal varices without bleeding: Secondary | ICD-10-CM | POA: Insufficient documentation

## 2018-06-18 DIAGNOSIS — K7682 Hepatic encephalopathy: Secondary | ICD-10-CM | POA: Diagnosis present

## 2018-06-18 DIAGNOSIS — M1990 Unspecified osteoarthritis, unspecified site: Secondary | ICD-10-CM | POA: Insufficient documentation

## 2018-06-18 DIAGNOSIS — E1121 Type 2 diabetes mellitus with diabetic nephropathy: Secondary | ICD-10-CM | POA: Diagnosis not present

## 2018-06-18 DIAGNOSIS — E860 Dehydration: Secondary | ICD-10-CM | POA: Diagnosis not present

## 2018-06-18 DIAGNOSIS — E1122 Type 2 diabetes mellitus with diabetic chronic kidney disease: Secondary | ICD-10-CM | POA: Diagnosis not present

## 2018-06-18 DIAGNOSIS — Z794 Long term (current) use of insulin: Secondary | ICD-10-CM | POA: Diagnosis not present

## 2018-06-18 DIAGNOSIS — K7290 Hepatic failure, unspecified without coma: Principal | ICD-10-CM | POA: Insufficient documentation

## 2018-06-18 DIAGNOSIS — K449 Diaphragmatic hernia without obstruction or gangrene: Secondary | ICD-10-CM | POA: Diagnosis not present

## 2018-06-18 DIAGNOSIS — Z6841 Body Mass Index (BMI) 40.0 and over, adult: Secondary | ICD-10-CM | POA: Diagnosis not present

## 2018-06-18 DIAGNOSIS — F329 Major depressive disorder, single episode, unspecified: Secondary | ICD-10-CM | POA: Insufficient documentation

## 2018-06-18 DIAGNOSIS — N183 Chronic kidney disease, stage 3 (moderate): Secondary | ICD-10-CM | POA: Diagnosis not present

## 2018-06-18 DIAGNOSIS — G4730 Sleep apnea, unspecified: Secondary | ICD-10-CM | POA: Insufficient documentation

## 2018-06-18 DIAGNOSIS — D631 Anemia in chronic kidney disease: Secondary | ICD-10-CM | POA: Insufficient documentation

## 2018-06-18 DIAGNOSIS — K31811 Angiodysplasia of stomach and duodenum with bleeding: Secondary | ICD-10-CM | POA: Insufficient documentation

## 2018-06-18 DIAGNOSIS — R531 Weakness: Secondary | ICD-10-CM | POA: Diagnosis present

## 2018-06-18 DIAGNOSIS — F419 Anxiety disorder, unspecified: Secondary | ICD-10-CM | POA: Diagnosis not present

## 2018-06-18 DIAGNOSIS — I129 Hypertensive chronic kidney disease with stage 1 through stage 4 chronic kidney disease, or unspecified chronic kidney disease: Secondary | ICD-10-CM | POA: Diagnosis not present

## 2018-06-18 DIAGNOSIS — Z79899 Other long term (current) drug therapy: Secondary | ICD-10-CM | POA: Diagnosis not present

## 2018-06-18 DIAGNOSIS — K219 Gastro-esophageal reflux disease without esophagitis: Secondary | ICD-10-CM | POA: Insufficient documentation

## 2018-06-18 DIAGNOSIS — D61818 Other pancytopenia: Secondary | ICD-10-CM | POA: Insufficient documentation

## 2018-06-18 DIAGNOSIS — R7989 Other specified abnormal findings of blood chemistry: Secondary | ICD-10-CM

## 2018-06-18 DIAGNOSIS — N179 Acute kidney failure, unspecified: Secondary | ICD-10-CM | POA: Diagnosis not present

## 2018-06-18 DIAGNOSIS — K7460 Unspecified cirrhosis of liver: Secondary | ICD-10-CM | POA: Insufficient documentation

## 2018-06-18 DIAGNOSIS — F17290 Nicotine dependence, other tobacco product, uncomplicated: Secondary | ICD-10-CM | POA: Diagnosis not present

## 2018-06-18 DIAGNOSIS — Z8249 Family history of ischemic heart disease and other diseases of the circulatory system: Secondary | ICD-10-CM | POA: Insufficient documentation

## 2018-06-18 DIAGNOSIS — E669 Obesity, unspecified: Secondary | ICD-10-CM | POA: Insufficient documentation

## 2018-06-18 LAB — CBC
HEMATOCRIT: 27.9 % — AB (ref 40.0–52.0)
Hemoglobin: 10 g/dL — ABNORMAL LOW (ref 13.0–18.0)
MCH: 33 pg (ref 26.0–34.0)
MCHC: 36 g/dL (ref 32.0–36.0)
MCV: 91.7 fL (ref 80.0–100.0)
Platelets: 89 10*3/uL — ABNORMAL LOW (ref 150–440)
RBC: 3.04 MIL/uL — ABNORMAL LOW (ref 4.40–5.90)
RDW: 15.1 % — AB (ref 11.5–14.5)
WBC: 4.8 10*3/uL (ref 3.8–10.6)

## 2018-06-18 LAB — BASIC METABOLIC PANEL
Anion gap: 9 (ref 5–15)
BUN: 25 mg/dL — AB (ref 8–23)
CHLORIDE: 101 mmol/L (ref 98–111)
CO2: 27 mmol/L (ref 22–32)
Calcium: 9.5 mg/dL (ref 8.9–10.3)
Creatinine, Ser: 2.17 mg/dL — ABNORMAL HIGH (ref 0.61–1.24)
GFR calc Af Amer: 35 mL/min — ABNORMAL LOW (ref >60)
GFR calc non Af Amer: 30 mL/min — ABNORMAL LOW (ref >60)
GLUCOSE: 254 mg/dL — AB (ref 70–99)
POTASSIUM: 3.5 mmol/L (ref 3.5–5.1)
Sodium: 137 mmol/L (ref 135–145)

## 2018-06-18 LAB — TYPE AND SCREEN
ABO/RH(D): A NEG
ANTIBODY SCREEN: NEGATIVE

## 2018-06-18 LAB — URINALYSIS, COMPLETE (UACMP) WITH MICROSCOPIC
BACTERIA UA: NONE SEEN
BILIRUBIN URINE: NEGATIVE
Glucose, UA: 50 mg/dL — AB
Hgb urine dipstick: NEGATIVE
Ketones, ur: NEGATIVE mg/dL
Nitrite: NEGATIVE
PROTEIN: 30 mg/dL — AB
SPECIFIC GRAVITY, URINE: 1.006 (ref 1.005–1.030)
pH: 8 (ref 5.0–8.0)

## 2018-06-18 LAB — HEPATIC FUNCTION PANEL
ALBUMIN: 3.4 g/dL — AB (ref 3.5–5.0)
ALK PHOS: 79 U/L (ref 38–126)
ALT: 20 U/L (ref 0–44)
AST: 28 U/L (ref 15–41)
Bilirubin, Direct: 0.2 mg/dL (ref 0.0–0.2)
Indirect Bilirubin: 0.8 mg/dL (ref 0.3–0.9)
TOTAL PROTEIN: 7 g/dL (ref 6.5–8.1)
Total Bilirubin: 1 mg/dL (ref 0.3–1.2)

## 2018-06-18 LAB — AMMONIA: AMMONIA: 70 umol/L — AB (ref 9–35)

## 2018-06-18 LAB — GLUCOSE, CAPILLARY
GLUCOSE-CAPILLARY: 183 mg/dL — AB (ref 70–99)
GLUCOSE-CAPILLARY: 285 mg/dL — AB (ref 70–99)

## 2018-06-18 LAB — TROPONIN I

## 2018-06-18 LAB — BRAIN NATRIURETIC PEPTIDE: B Natriuretic Peptide: 15 pg/mL (ref 0.0–100.0)

## 2018-06-18 MED ORDER — INFLUENZA VAC SPLIT HIGH-DOSE 0.5 ML IM SUSY
0.5000 mL | PREFILLED_SYRINGE | INTRAMUSCULAR | Status: DC
  Filled 2018-06-18: qty 0.5

## 2018-06-18 MED ORDER — BISACODYL 5 MG PO TBEC: 5.0000 mg | DELAYED_RELEASE_TABLET | Freq: Every day | ORAL | Status: DC | PRN

## 2018-06-18 MED ORDER — ONDANSETRON HCL 4 MG/2ML IJ SOLN: 4.0000 mg | Freq: Four times a day (QID) | INTRAMUSCULAR | Status: DC | PRN

## 2018-06-18 MED ORDER — PANTOPRAZOLE SODIUM 40 MG PO TBEC
40.0000 mg | DELAYED_RELEASE_TABLET | Freq: Every day | ORAL | Status: DC
  Administered 2018-06-18 – 2018-06-19 (×2): 40 mg via ORAL
  Filled 2018-06-18 (×2): qty 1

## 2018-06-18 MED ORDER — LACTULOSE 10 GM/15ML PO SOLN
20.0000 g | Freq: Four times a day (QID) | ORAL | Status: DC
  Administered 2018-06-18 – 2018-06-19 (×3): 20 g via ORAL
  Filled 2018-06-18 (×3): qty 30

## 2018-06-18 MED ORDER — MAGNESIUM OXIDE 400 (241.3 MG) MG PO TABS
400.0000 mg | ORAL_TABLET | Freq: Every morning | ORAL | Status: DC
  Administered 2018-06-19: 400 mg via ORAL
  Filled 2018-06-18: qty 1

## 2018-06-18 MED ORDER — DOCUSATE SODIUM 100 MG PO CAPS
100.0000 mg | ORAL_CAPSULE | Freq: Two times a day (BID) | ORAL | Status: DC
  Administered 2018-06-18 – 2018-06-19 (×2): 100 mg via ORAL
  Filled 2018-06-18 (×2): qty 1

## 2018-06-18 MED ORDER — ADULT MULTIVITAMIN W/MINERALS CH
1.0000 | ORAL_TABLET | Freq: Every day | ORAL | Status: DC
  Administered 2018-06-19: 08:00:00 1 via ORAL
  Filled 2018-06-18 (×2): qty 1

## 2018-06-18 MED ORDER — ACETAMINOPHEN 650 MG RE SUPP: 650.0000 mg | Freq: Four times a day (QID) | RECTAL | Status: DC | PRN

## 2018-06-18 MED ORDER — ONDANSETRON HCL 4 MG PO TABS: 4.0000 mg | ORAL_TABLET | Freq: Four times a day (QID) | ORAL | Status: DC | PRN

## 2018-06-18 MED ORDER — PNEUMOCOCCAL VAC POLYVALENT 25 MCG/0.5ML IJ INJ: 0.5000 mL | INJECTION | INTRAMUSCULAR | Status: DC

## 2018-06-18 MED ORDER — TRAZODONE HCL 50 MG PO TABS: 25.0000 mg | ORAL_TABLET | Freq: Every evening | ORAL | Status: DC | PRN

## 2018-06-18 MED ORDER — SERTRALINE HCL 50 MG PO TABS
25.0000 mg | ORAL_TABLET | Freq: Every day | ORAL | Status: DC
  Administered 2018-06-18 – 2018-06-19 (×2): 25 mg via ORAL
  Filled 2018-06-18 (×2): qty 1

## 2018-06-18 MED ORDER — POTASSIUM CHLORIDE CRYS ER 20 MEQ PO TBCR
20.0000 meq | EXTENDED_RELEASE_TABLET | Freq: Two times a day (BID) | ORAL | Status: DC
  Administered 2018-06-18 – 2018-06-19 (×2): 20 meq via ORAL
  Filled 2018-06-18 (×2): qty 1

## 2018-06-18 MED ORDER — INSULIN ASPART 100 UNIT/ML ~~LOC~~ SOLN
0.0000 [IU] | Freq: Three times a day (TID) | SUBCUTANEOUS | Status: DC
  Administered 2018-06-18: 3 [IU] via SUBCUTANEOUS
  Administered 2018-06-19: 5 [IU] via SUBCUTANEOUS
  Filled 2018-06-18 (×2): qty 1

## 2018-06-18 MED ORDER — RIFAXIMIN 550 MG PO TABS
550.0000 mg | ORAL_TABLET | Freq: Two times a day (BID) | ORAL | Status: DC
  Administered 2018-06-18 – 2018-06-19 (×2): 550 mg via ORAL
  Filled 2018-06-18 (×2): qty 1

## 2018-06-18 MED ORDER — ACETAMINOPHEN 325 MG PO TABS: 650.0000 mg | ORAL_TABLET | Freq: Four times a day (QID) | ORAL | Status: DC | PRN

## 2018-06-18 NOTE — ED Notes (Signed)
First Nurse Note: Patient ambulatory to ED states "Dr. Vira Agar told me to come over here as soon as I could".  Patient states he had a procedure done.  Alert and oriented. Color good.  Skin warm and dry.

## 2018-06-18 NOTE — ED Notes (Signed)
Informed RN that patient has been roomed and is ready for evaluation.  Patient in NAD at this time and call bell placed within reach.   

## 2018-06-18 NOTE — ED Notes (Signed)
Pt to and from XR. ABCs intact. NAD

## 2018-06-18 NOTE — ED Notes (Signed)
Red and blue top sent to lab as well.

## 2018-06-18 NOTE — ED Triage Notes (Signed)
Pt to ED via POV with c/o dizziness and weakness since last night, states PCP was told to come to ED. VSS, speech clear. Pt A&OX4

## 2018-06-18 NOTE — ED Provider Notes (Signed)
[facility] Emergency Department Provider Note   ____________________________________________   First MD Initiated Contact with Patient 06/18/18 1244     (approximate)  I have reviewed the triage vital signs and the nursing notes.   HISTORY  Chief Complaint Weakness    HPI Corey West is a 67 y.o. male reports onset of weakness last night.  He still weak now.  He feels a little lightheaded as well.  Has a history of cirrhosis and is has had a number of bleeding areas banded in his stomach he reports.  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  He also has diabetes.  Past Medical History:  Diagnosis Date  . Anemia   . Anxiety    controlled;   . Arthritis   . AVM (arteriovenous malformation) of stomach, acquired with hemorrhage   . Barrett's esophagus   . Chronic kidney disease    renal infufficiency  . Cirrhosis ([place])   . Depression    controlled;   Marland Kitchen Diabetes mellitus without complication ([place])    not controlled, taking insulin but sugar continues to run high;   . Edema   . Esophageal varices ([place])   . GERD (gastroesophageal reflux disease)   . History of hiatal hernia   . Hyperlipidemia   . Hypertension    controlled well;   . 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 Obesity   . Pancytopenia ([place])   . Polyp, stomach    with chronic blood loss  . Sleep apnea    does not wear a cpap, Medicare would not pay for it   . Venous stasis dermatitis of both lower extremities   . Venous stasis of both lower extremities    with cellulitis    Patient Active Problem List   Diagnosis Date Noted  . Goals of care, counseling/discussion   . Palliative care encounter   . 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 Hepatic encephalopathy ([place]) 10/18/2017  . Hypokalemia 10/09/2017  . Hyperglycemia 10/09/2017  . Symptomatic anemia 06/26/2017  . Sepsis ([place]) 06/12/2017  . Cellulitis in diabetic foot ([place])  02/21/2017  . Uncontrolled diabetes mellitus ([place]) 02/21/2017  . Cellulitis 02/21/2017  . GI bleed 07/20/2015  . Hematemesis 02/08/2015  . Cirrhosis ([place]) 02/08/2015  . Esophageal varices ([place]) 02/08/2015  . Type 2 diabetes mellitus ([place]) 02/08/2015  . Gastroesophageal reflux disease 02/08/2015  . Hyperlipidemia 02/08/2015  . Iron deficiency anemia 12/04/2013  . Thrombocytopenia ([place]) 12/04/2013  . Splenomegaly 11/04/2013    Past Surgical History:  Procedure Laterality Date  . ESOPHAGOGASTRODUODENOSCOPY N/A 02/09/2015   Procedure: ESOPHAGOGASTRODUODENOSCOPY (EGD);  Surgeon: Manya Silvas, MD;  Location: [facility] ENDOSCOPY;  Service: Endoscopy;  Laterality: N/A;  . ESOPHAGOGASTRODUODENOSCOPY N/A 07/22/2015   Procedure: ESOPHAGOGASTRODUODENOSCOPY (EGD);  Surgeon: Manya Silvas, MD;  Location[facility] ENDOSCOPY;  Service: Endoscopy;  Laterality: N/A;  . ESOPHAGOGASTRODUODENOSCOPY N/A 06/26/2017   Procedure: ESOPHAGOGASTRODUODENOSCOPY (EGD);  Surgeon: Manya Silvas, MD;  Location: [facility] ENDOSCOPY;  Service: Endoscopy;  Laterality: N/A;  . ESOPHAGOGASTRODUODENOSCOPY N/A 06/27/2017   Procedure: ESOPHAGOGASTRODUODENOSCOPY (EGD);  Surgeon: Manya Silvas, MD;  Location: [facility] ENDOSCOPY;  Service: Endoscopy;  Laterality: N/A;  . ESOPHAGOGASTRODUODENOSCOPY N/A 06/28/2017   Procedure: ESOPHAGOGASTRODUODENOSCOPY (EGD);  Surgeon: Manya Silvas, MD;  Location: [facility] ENDOSCOPY;  Service: Endoscopy;  Laterality: N/A;  . ESOPHAGOGASTRODUODENOSCOPY Bilateral 10/11/2017   Procedure: ESOPHAGOGASTRODUODENOSCOPY (EGD);  Surgeon: Manya Silvas, MD;  Location: [facility] ENDOSCOPY;  Service: Endoscopy;  Laterality:  Bilateral;  . ESOPHAGOGASTRODUODENOSCOPY N/A 12/04/2017   Procedure: ESOPHAGOGASTRODUODENOSCOPY (EGD);  Surgeon: Manya Silvas, MD;  Location: [facility] ENDOSCOPY;  Service: Endoscopy;  Laterality: N/A;  . ESOPHAGOGASTRODUODENOSCOPY (EGD) WITH PROPOFOL N/A 07/20/2015   Procedure:  ESOPHAGOGASTRODUODENOSCOPY (EGD) WITH PROPOFOL;  Surgeon: Manya Silvas, MD;  Location: [facility] ENDOSCOPY;  Service: Endoscopy;  Laterality: N/A;  . ESOPHAGOGASTRODUODENOSCOPY (EGD) WITH PROPOFOL N/A 09/16/2015   Procedure: ESOPHAGOGASTRODUODENOSCOPY (EGD) WITH PROPOFOL;  Surgeon: Manya Silvas, MD;  Location: [facility] ENDOSCOPY;  Service: Endoscopy;  Laterality: N/A;  . ESOPHAGOGASTRODUODENOSCOPY (EGD) WITH PROPOFOL N/A 03/16/2016   Procedure: ESOPHAGOGASTRODUODENOSCOPY (EGD) WITH PROPOFOL;  Surgeon: Manya Silvas, MD;  Location: [facility] ENDOSCOPY;  Service: Endoscopy;  Laterality: N/A;  . ESOPHAGOGASTRODUODENOSCOPY (EGD) WITH PROPOFOL N/A 09/14/2016   Procedure: ESOPHAGOGASTRODUODENOSCOPY (EGD) WITH PROPOFOL;  Surgeon: Manya Silvas, MD;  Location: [facility] ENDOSCOPY;  Service: Endoscopy;  Laterality: N/A;  . ESOPHAGOGASTRODUODENOSCOPY (EGD) WITH PROPOFOL N/A 11/05/2016   Procedure: ESOPHAGOGASTRODUODENOSCOPY (EGD) WITH PROPOFOL;  Surgeon: Manya Silvas, MD;  Location: [facility] ENDOSCOPY;  Service: Endoscopy;  Laterality: N/A;  . ESOPHAGOGASTRODUODENOSCOPY (EGD) WITH PROPOFOL N/A 02/06/2017   Procedure: ESOPHAGOGASTRODUODENOSCOPY (EGD) WITH PROPOFOL;  Surgeon: Manya Silvas, MD;  Location: [facility] ENDOSCOPY;  Service: Endoscopy;  Laterality: N/A;  . ESOPHAGOGASTRODUODENOSCOPY (EGD) WITH PROPOFOL N/A 04/24/2017   Procedure: ESOPHAGOGASTRODUODENOSCOPY (EGD) WITH PROPOFOL;  Surgeon: Manya Silvas, MD;  Location: [facility] ENDOSCOPY;  Service: Endoscopy;  Laterality: N/A;  . ESOPHAGOGASTRODUODENOSCOPY (EGD) WITH PROPOFOL N/A 07/31/2017   Procedure: ESOPHAGOGASTRODUODENOSCOPY (EGD) WITH PROPOFOL;  Surgeon: Manya Silvas, MD;  Location: [facility] ENDOSCOPY;  Service: Endoscopy;  Laterality: N/A;  . GIVENS CAPSULE STUDY N/A 12/05/2017   Procedure: GIVENS CAPSULE STUDY;  Surgeon: Manya Silvas, MD;  Location: [facility] ENDOSCOPY;  Service: Endoscopy;  Laterality: N/A;  . TONSILLECTOMY    . TONSILLECTOMY AND  ADENOIDECTOMY    . ULNAR NERVE TRANSPOSITION    . UVULOPALATOPHARYNGOPLASTY      Prior to Admission medications   Medication Sig Start Date End Date Taking? Authorizing Provider  FERROUS SULFATE ER PO Take 325 mg by mouth 2 (two) times daily.     [provider]  furosemide (LASIX) 40 MG tablet Take 40 mg by mouth 2 (two) times daily.    [provider]  glucose blood (CONTOUR NEXT TEST) test strip USE TWICE A DAY AS INSTRUCTED 02/11/18   [provider]  insulin aspart protamine- aspart (NOVOLOG MIX 70/30) (70-30) 100 UNIT/ML injection Inject 0.2 mLs (20 Units total) into the skin 2 (two) times daily with a meal. Patient taking differently: Inject 84u every morning and 65u every evening 12/18/17   Fritzi Mandes, MD  insulin NPH-regular Human (HUMULIN 70/30) (70-30) 100 UNIT/ML injection Inject into the skin.    [provider]  lactulose (CHRONULAC) 10 GM/15ML solution Take 30 mLs (20 g total) by mouth 4 (four) times daily. 01/01/18   Loletha Grayer, MD  LORazepam (ATIVAN) 0.5 MG tablet Take 0.5 mg by mouth 3 (three) times daily as needed for anxiety.     [provider]  Magnesium Oxide 400 (240 Mg) MG TABS Take 1 tablet (400 mg total) by mouth every morning. Patient not taking: Reported on 03/17/2018 12/05/17   Loletha Grayer, MD  metolazone (ZAROXOLYN) 2.5 MG tablet  03/13/18   [provider]  Multiple Vitamins-Minerals (CENTRUM SILVER 50+MEN) TABS Take 1 tablet by mouth 2 (two) times daily.     [provider]  pantoprazole (PROTONIX) 40 MG tablet Take 1 tablet (  40 mg total) by mouth daily. Patient not taking: Reported on 03/17/2018 12/05/17 12/05/18  Loletha Grayer, MD  potassium chloride SA (K-DUR,KLOR-CON) 20 MEQ tablet Take 1 tablet (20 mEq total) by mouth 2 (two) times daily. 01/01/18   Loletha Grayer, MD  ranitidine (ZANTAC) 150 MG tablet  03/13/18   [provider]  rifaximin (XIFAXAN) 550 MG TABS tablet Take 1 tablet  (550 mg total) by mouth 2 (two) times daily. 01/01/18   Loletha Grayer, MD  sertraline (ZOLOFT) 100 MG tablet  02/06/18   [provider]  spironolactone (ALDACTONE) 25 MG tablet Take 1 tablet (25 mg total) by mouth 2 (two) times daily. Patient not taking: Reported on 03/17/2018 12/05/17   Loletha Grayer, MD  traMADol (ULTRAM) 50 MG tablet Take 50 mg by mouth every 8 (eight) hours as needed.     [provider]  ULTICARE MINI PEN NEEDLES 31G X 6 MM [place]  03/14/18   [provider]    Allergies Patient has no known allergies.  Family History  Problem Relation Age of Onset  . Diabetes Other   . Transient ischemic attack Father   . CAD Father     Social History Social History   Tobacco Use  . Smoking status: Current Some Day Smoker    Types: Cigars  . Smokeless tobacco: Never Used  . Tobacco comment: smoke 1 at night occasionally;   Substance Use Topics  . Alcohol use: No    Comment: stopped 12 years ago  . Drug use: No    Review of Systems  Constitutional: No fever/chills Eyes: No visual changes. ENT: No sore throat. Cardiovascular: Denies chest pain. Respiratory: Denies shortness of breath. Gastrointestinal: Some right upper quadrant abdominal pain.  No nausea, no vomiting.  No diarrhea.  No constipation. Genitourinary: Negative for dysuria. Musculoskeletal: Negative for back pain. Skin: Negative for rash. Neurological: Negative for headaches, focal weakness   ____________________________________________   PHYSICAL EXAM:  VITAL SIGNS: ED Triage Vitals  Enc Vitals Group     BP 06/18/18 1056 (!) 151/53     Pulse Rate 06/18/18 1056 85     Resp 06/18/18 1056 16     Temp 06/18/18 1056 98 F (36.7 C)     Temp Source 06/18/18 1056 Oral     SpO2 06/18/18 1056 98 %     Weight 06/18/18 1058 266 lb 12.1 oz (121 kg)     Height 06/18/18 1058 5\' 9"  (1.753 m)     Head Circumference --      Peak Flow --      Pain Score 06/18/18 1058 0     Pain  Loc --      Pain Edu? --      Excl. in [place]? --     Constitutional: Alert and oriented.  Tired appearing and in no acute distress. Eyes: Conjunctivae are normal Head: Atraumatic. Nose: No congestion/rhinnorhea. Mouth/Throat: Mucous membranes are moist.  Oropharynx non-erythematous. Neck: No stridor. Cardiovascular: Normal rate, regular rhythm. Grossly normal heart sounds.  Good peripheral circulation. Respiratory: Normal respiratory effort.  No retractions. Lungs CTAB. Gastrointestinal: Soft and nontende except in the right upper quadrant where it is tender to palpation and slightly to percussion no distention. No abdominal bruits. No CVA tenderness. Rectal: No hemorrhoids seen stool is diarrheal but Hemoccult positive brown  musculoskeletal: No lower extremity tenderness 1+ edema.   Neurologic:  Normal speech and language. No gross focal neurologic deficits are appreciated.  Skin:  Skin is warm,  dry and intact.  Psychiatric: Mood and affect are normal. Speech and behavior are normal.  ____________________________________________   LABS (all labs ordered are listed, but only abnormal results are displayed)  Labs Reviewed  BASIC METABOLIC PANEL - Abnormal; Notable for the following components:      Result Value   Glucose, Bld 254 (*)    BUN 25 (*)    Creatinine, Ser 2.17 (*)    GFR calc non Af Amer 30 (*)    GFR calc Af Amer 35 (*)    All other components within normal limits  CBC - Abnormal; Notable for the following components:   RBC 3.04 (*)    Hemoglobin 10.0 (*)    HCT 27.9 (*)    RDW 15.1 (*)    Platelets 89 (*)    All other components within normal limits  URINALYSIS, COMPLETE (UACMP) WITH MICROSCOPIC - Abnormal; Notable for the following components:   Color, Urine YELLOW (*)    APPearance CLEAR (*)    Glucose, UA 50 (*)    Protein, ur 30 (*)    Leukocytes, UA TRACE (*)    All other components within normal limits  HEPATIC FUNCTION PANEL - Abnormal; Notable for the  following components:   Albumin 3.4 (*)    All other components within normal limits  AMMONIA - Abnormal; Notable for the following components:   Ammonia 70 (*)    All other components within normal limits  TROPONIN I  BRAIN NATRIURETIC PEPTIDE  TYPE AND SCREEN   ____________________________________________  EKG EKG #1 read and interpreted by me shows sinus rhythm at a rate of 66 normal axis first-degree AV block EKG #2 shows sinus rhythm rate of 62 normal axis baseline here is irregular difficult to tell if there is AV block although it looks likely.  No significant change from #1  ____________________________________________  RADIOLOGY  ED MD interpretation: Chest x-ray read by radiology reviewed by me shows no acute disease  Official radiology report(s): Dg Chest 2 View  Result Date: 06/18/2018 CLINICAL DATA:  Dizziness and weakness since last night. EXAM: CHEST - 2 VIEW COMPARISON:  12/31/2017 and 12/25/2017 portable studies. FINDINGS: The heart size and mediastinal contours are stable. There is stable mild elevation of the left hemidiaphragm. The lungs are clear. There is no pleural effusion or pneumothorax. No acute osseous findings are demonstrated. Telemetry leads overlie the chest. IMPRESSION: Stable chest.  No acute cardiopulmonary process. Electronically Signed   By: Richardean Sale M.D.   On: 06/18/2018 14:17    ____________________________________________   PROCEDURES  Proce  Procedures  Critical Care performed:   ____________________________________________   INITIAL IMPRESSION / ASSESSMENT AND PLAN / ED COURSE   Discussed patient with Dr. Vira Agar his gastroenterologist.  Dr. Vira Agar has known the patient for many years knows him very well.  Patient is still very weak and sleepy.  Dr. Vira Agar feels with altered mental status history of GI bleed heme positive stool in the elevated ammonia the best thing would be to do for this gentleman but it would be to a  observe him overnight at least.  We will do this.      ____________________________________________   FINAL CLINICAL IMPRESSION(S) / ED DIAGNOSES  Final diagnoses:  Weakness  Increased ammonia level     ED Discharge Orders    None       Note:  This document was prepared using Dragon voice recognition software and may include unintentional dictation errors.    [place],  Regan Rakers, MD 06/18/18 1600

## 2018-06-18 NOTE — ED Notes (Addendum)
Pt attempted to provide urine sample.. Pt did not provide enough urine to send down to lab. Will attempt again when pt has to urinate again.

## 2018-06-18 NOTE — ED Notes (Signed)
Transported pt to room 125

## 2018-06-18 NOTE — H&P (Signed)
[place] [facility] [place] NAME: Corey West    MR#:  [id number]  [place] OF BIRTH:  Aug 30, 1951  DATE OF ADMISSION:  06/18/2018  PRIMARY CARE PHYSICIAN: Baxter Hire, MD   REQUESTING/REFERRING PHYSICIAN:   CHIEF COMPLAINT: Generalized weakness   Chief Complaint  Patient presents with  . Weakness    HISTORY OF PRESENT ILLNESS:  Corey West  is a 67 y.o. 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 Vira Agar has been feeling generalized weakness presented here for evaluation.  Denies any shortness of breath or chest pain.  He says that he is feeling really dizzy, swimmy headed since yesterday and also noticed dark urine.  Mentions that he does not miss his dose of lactulose and Lasix.  Number to have ammonia of 70, slight bump in the creatinine.  Hemodynamically stable otherwise.  Hemoglobin 10 without evidence of drop in hemoglobin.  Had history of multiple EGDs before and follows up with Dr. 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  PAST MEDICAL HISTORY:   Past Medical History:  Diagnosis Date  . Anemia   . Anxiety    controlled;   . Arthritis   . AVM (arteriovenous malformation) of stomach, acquired with hemorrhage   . Barrett's esophagus   . Chronic kidney disease    renal infufficiency  . Cirrhosis ([place])   . Depression    controlled;   Marland Kitchen Diabetes mellitus without complication ([place])    not controlled, taking insulin but sugar continues to run high;   . Edema   . Esophageal varices ([place])   . GERD (gastroesophageal reflux disease)   . History of hiatal hernia   . Hyperlipidemia   . Hypertension    controlled well;   . 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 Obesity   . Pancytopenia ([place])   . Polyp, stomach    with chronic blood loss  . Sleep apnea    does not wear a cpap,  Medicare would not pay for it   . Venous stasis dermatitis of both lower extremities   . Venous stasis of both lower extremities    with cellulitis    PAST SURGICAL HISTOIRY:   Past Surgical History:  Procedure Laterality Date  . ESOPHAGOGASTRODUODENOSCOPY N/A 02/09/2015   Procedure: ESOPHAGOGASTRODUODENOSCOPY (EGD);  Surgeon: Manya Silvas, MD;  Location: [facility] ENDOSCOPY;  Service: Endoscopy;  Laterality: N/A;  . ESOPHAGOGASTRODUODENOSCOPY N/A 07/22/2015   Procedure: ESOPHAGOGASTRODUODENOSCOPY (EGD);  Surgeon: Manya Silvas, MD;  Location: [facility] ENDOSCOPY;  Service: Endoscopy;  Laterality: N/A;  . ESOPHAGOGASTRODUODENOSCOPY N/A 06/26/2017   Procedure: ESOPHAGOGASTRODUODENOSCOPY (EGD);  Surgeon: Manya Silvas, MD;  Location: [facility] ENDOSCOPY;  Service: Endoscopy;  Laterality: N/A;  . ESOPHAGOGASTRODUODENOSCOPY N/A 06/27/2017   Procedure: ESOPHAGOGASTRODUODENOSCOPY (EGD);  Surgeon: Manya Silvas, MD;  Location: [facility] ENDOSCOPY;  Service: Endoscopy;  Laterality: N/A;  . ESOPHAGOGASTRODUODENOSCOPY N/A 06/28/2017   Procedure: ESOPHAGOGASTRODUODENOSCOPY (EGD);  Surgeon: Manya Silvas, MD;  Location: [facility] ENDOSCOPY;  Service: Endoscopy;  Laterality: N/A;  . ESOPHAGOGASTRODUODENOSCOPY Bilateral 10/11/2017   Procedure: ESOPHAGOGASTRODUODENOSCOPY (EGD);  Surgeon: Manya Silvas, MD;  Location: [facility] ENDOSCOPY;  Service: Endoscopy;  Laterality: Bilateral;  . ESOPHAGOGASTRODUODENOSCOPY N/A 12/04/2017   Procedure: ESOPHAGOGASTRODUODENOSCOPY (EGD);  Surgeon: Manya Silvas, MD;  Location: [facility] ENDOSCOPY;  Service: Endoscopy;  Laterality: N/A;  . ESOPHAGOGASTRODUODENOSCOPY (EGD) WITH  PROPOFOL N/A 07/20/2015   Procedure: ESOPHAGOGASTRODUODENOSCOPY (EGD) WITH PROPOFOL;  Surgeon: Manya Silvas, MD;  Location: [facility] ENDOSCOPY;  Service: Endoscopy;  Laterality: N/A;  . ESOPHAGOGASTRODUODENOSCOPY (EGD) WITH PROPOFOL N/A 09/16/2015   Procedure: ESOPHAGOGASTRODUODENOSCOPY (EGD) WITH PROPOFOL;   Surgeon: Manya Silvas, MD;  Location: [facility] ENDOSCOPY;  Service: Endoscopy;  Laterality: N/A;  . ESOPHAGOGASTRODUODENOSCOPY (EGD) WITH PROPOFOL N/A 03/16/2016   Procedure: ESOPHAGOGASTRODUODENOSCOPY (EGD) WITH PROPOFOL;  Surgeon: Manya Silvas, MD;  Location: [facility] ENDOSCOPY;  Service: Endoscopy;  Laterality: N/A;  . ESOPHAGOGASTRODUODENOSCOPY (EGD) WITH PROPOFOL N/A 09/14/2016   Procedure: ESOPHAGOGASTRODUODENOSCOPY (EGD) WITH PROPOFOL;  Surgeon: Manya Silvas, MD;  Location: [facility] ENDOSCOPY;  Service: Endoscopy;  Laterality: N/A;  . ESOPHAGOGASTRODUODENOSCOPY (EGD) WITH PROPOFOL N/A 11/05/2016   Procedure: ESOPHAGOGASTRODUODENOSCOPY (EGD) WITH PROPOFOL;  Surgeon: Manya Silvas, MD;  Location: [facility] ENDOSCOPY;  Service: Endoscopy;  Laterality: N/A;  . ESOPHAGOGASTRODUODENOSCOPY (EGD) WITH PROPOFOL N/A 02/06/2017   Procedure: ESOPHAGOGASTRODUODENOSCOPY (EGD) WITH PROPOFOL;  Surgeon: Manya Silvas, MD;  Location: [facility] ENDOSCOPY;  Service: Endoscopy;  Laterality: N/A;  . ESOPHAGOGASTRODUODENOSCOPY (EGD) WITH PROPOFOL N/A 04/24/2017   Procedure: ESOPHAGOGASTRODUODENOSCOPY (EGD) WITH PROPOFOL;  Surgeon: Manya Silvas, MD;  Location: [facility] ENDOSCOPY;  Service: Endoscopy;  Laterality: N/A;  . ESOPHAGOGASTRODUODENOSCOPY (EGD) WITH PROPOFOL N/A 07/31/2017   Procedure: ESOPHAGOGASTRODUODENOSCOPY (EGD) WITH PROPOFOL;  Surgeon: Manya Silvas, MD;  Location: [facility] ENDOSCOPY;  Service: Endoscopy;  Laterality: N/A;  . GIVENS CAPSULE STUDY N/A 12/05/2017   Procedure: GIVENS CAPSULE STUDY;  Surgeon: Manya Silvas, MD;  Location: [facility] ENDOSCOPY;  Service: Endoscopy;  Laterality: N/A;  . TONSILLECTOMY    . TONSILLECTOMY AND ADENOIDECTOMY    . ULNAR NERVE TRANSPOSITION    . UVULOPALATOPHARYNGOPLASTY      SOCIAL HISTORY:   Social History   Tobacco Use  . Smoking status: Current Some Day Smoker    Types: Cigars  . Smokeless tobacco: Never Used  . Tobacco comment: smoke 1 at night occasionally;    Substance Use Topics  . Alcohol use: No    Comment: stopped 12 years ago    FAMILY HISTORY:   Family History  Problem Relation Age of Onset  . Diabetes Other   . Transient ischemic attack Father   . CAD Father     DRUG ALLERGIES:  No Known Allergies  REVIEW OF SYSTEMS:  CONSTITUTIONAL: No fever, patient has fatigue, lethargy, sleeping more.  EYES: No blurred or double vision.  EARS, NOSE, AND THROAT: No tinnitus or ear pain.  RESPIRATORY: No cough, shortness of breath, wheezing or hemoptysis.  CARDIOVASCULAR: No chest pain, orthopnea, edema.  GASTROINTESTINAL: No nausea, vomiting, diarrhea or abdominal pain.  GENITOURINARY: No dysuria, hematuria.  ENDOCRINE: No polyuria, nocturia,  HEMATOLOGY: No anemia, easy bruising or bleeding SKIN: No rash or lesion. MUSCULOSKELETAL: No joint pain or arthritis.   NEUROLOGIC: No tingling, numbness, weakness.  PSYCHIATRY: No anxiety or depression.   MEDICATIONS AT HOME:   Prior to Admission medications   Medication Sig Start Date End Date Taking? Authorizing Provider  insulin aspart protamine- aspart (NOVOLOG MIX 70/30) (70-30) 100 UNIT/ML injection Inject 0.2 mLs (20 Units total) into the skin 2 (two) times daily with a meal. Patient taking differently: Inject 84u every morning and 65u every evening 12/18/17  Yes Fritzi Mandes, MD  lactulose (CHRONULAC) 10 GM/15ML solution Take 30 mLs (20 g total) by mouth 4 (four) times daily. 01/01/18  Yes Wieting, Richard, MD  omeprazole (PRILOSEC) 40 MG capsule Take 40 mg by mouth daily.  06/05/18  Yes [provider]  FERROUS SULFATE ER PO Take 325 mg by mouth 2 (two) times daily.     [provider]  furosemide (LASIX) 40 MG tablet Take 40 mg by mouth 2 (two) times daily.    [provider]  glucose blood (CONTOUR NEXT TEST) test strip USE TWICE A DAY AS INSTRUCTED 02/11/18   [provider]  insulin NPH-regular Human (HUMULIN 70/30) (70-30) 100 UNIT/ML injection Inject  into the skin.    [provider]  LORazepam (ATIVAN) 0.5 MG tablet Take 0.5 mg by mouth 3 (three) times daily as needed for anxiety.     [provider]  Magnesium Oxide 400 (240 Mg) MG TABS Take 1 tablet (400 mg total) by mouth every morning. Patient not taking: Reported on 03/17/2018 12/05/17   Loletha Grayer, MD  metolazone (ZAROXOLYN) 2.5 MG tablet  03/13/18   [provider]  Multiple Vitamins-Minerals (CENTRUM SILVER 50+MEN) TABS Take 1 tablet by mouth 2 (two) times daily.     [provider]  pantoprazole (PROTONIX) 40 MG tablet Take 1 tablet (40 mg total) by mouth daily. Patient not taking: Reported on 03/17/2018 12/05/17 12/05/18  Loletha Grayer, MD  potassium chloride SA (K-DUR,KLOR-CON) 20 MEQ tablet Take 1 tablet (20 mEq total) by mouth 2 (two) times daily. 01/01/18   Loletha Grayer, MD  ranitidine (ZANTAC) 150 MG tablet  03/13/18   [provider]  rifaximin (XIFAXAN) 550 MG TABS tablet Take 1 tablet (550 mg total) by mouth 2 (two) times daily. 01/01/18   Loletha Grayer, MD  sertraline (ZOLOFT) 100 MG tablet  02/06/18   [provider]  spironolactone (ALDACTONE) 25 MG tablet Take 1 tablet (25 mg total) by mouth 2 (two) times daily. Patient not taking: Reported on 03/17/2018 12/05/17   Loletha Grayer, MD  traMADol (ULTRAM) 50 MG tablet Take 50 mg by mouth every 8 (eight) hours as needed.     [provider]  ULTICARE MINI PEN NEEDLES 31G X 6 MM [place]  03/14/18   [provider]      VITAL SIGNS:  Blood pressure (!) 142/55, pulse 67, temperature 98 F (36.7 C), temperature source Oral, resp. rate 15, height 5\' 9"  (1.753 m), weight 121 kg, SpO2 96 %.  PHYSICAL EXAMINATION:  GENERAL:  67 y.o.-year-old patient lying in the bed with no acute distress.  EYES: Pupils equal, round, reactive to light and accommodation. No scleral icterus. Extraocular muscles intact.  HEENT: Head atraumatic, normocephalic. Oropharynx and  nasopharynx clear.  NECK:  Supple, no jugular venous distention. No thyroid enlargement, no tenderness.  LUNGS: Normal breath sounds bilaterally, no wheezing, rales,rhonchi or crepitation. No use of accessory muscles of respiration.  CARDIOVASCULAR: S1, S2 normal. No murmurs, rubs, or gallops.  ABDOMEN: Soft, nontender, nondistended. Bowel sounds present. No organomegaly or mass.  EXTREMITIES: 2+ pitting edema bilaterally NEUROLOGIC: Cranial nerves II through XII are intact. Muscle strength 5/5 in all extremities. Sensation intact. Gait not checked.  PSYCHIATRIC: slighty more lathargic than before.SKIN: No obvious rash, lesion, or ulcer.   LABORATORY PANEL:   CBC Recent Labs  Lab 06/18/18 1118  WBC 4.8  HGB 10.0*  HCT 27.9*  PLT 89*   ------------------------------------------------------------------------------------------------------------------  Chemistries  Recent Labs  Lab 06/18/18 1118  NA 137  K 3.5  CL 101  CO2 27  GLUCOSE 254*  BUN 25*  CREATININE 2.17*  CALCIUM 9.5  AST 28  ALT 20  ALKPHOS 79  BILITOT 1.0   ------------------------------------------------------------------------------------------------------------------  Cardiac Enzymes Recent Labs  Lab 06/18/18 1118  TROPONINI <0.03   ------------------------------------------------------------------------------------------------------------------  RADIOLOGY:  Dg Chest 2 View  Result Date: 06/18/2018 CLINICAL DATA:  Dizziness and weakness since last night. EXAM: CHEST - 2 VIEW COMPARISON:  12/31/2017 and 12/25/2017 portable studies. FINDINGS: The heart size and mediastinal contours are stable. There is stable mild elevation of the left hemidiaphragm. The lungs are clear. There is no pleural effusion or pneumothorax. No acute osseous findings are demonstrated. Telemetry leads overlie the chest. IMPRESSION: Stable chest.  No acute cardiopulmonary process. Electronically Signed   By: Richardean Sale M.D.    On: 06/18/2018 14:17    EKG:   Orders placed or performed during the hospital encounter of 06/18/18  . ED EKG  . ED EKG  . Repeat EKG  . Repeat EKG  . EKG 12-Lead  . EKG 12-Lead  . EKG 12-Lead  . EKG 12-Lead  . EKG 12-Lead  . EKG 12-Lead  Patient EKG shows normal rhythm with 66 bpm.  IMPRESSION AND PLAN:  67 year old male patient with multiple hospitalizations for anemia, multiple EGDs before for vascular malformations in the gastric area,  , follows up with Dr. Vira Agar has been having realized weakness associated with lethargy, have known him from previous admissions.  #1 acute on chronic renal failure causing dehydration and more concentrated urine.  Patient noticed diuretics tomorrow morning.  Patient already took his Lasix, Aldactone, Bumex today so please hold him tomorrow dose.  Patient is okay with that. 2.  Hepatic encephalopathy, patient ammonia is 70, more sleepy than usual able to answer my questions appropriately.  Continue lactulose 20 mL every 4 hours along xifaxan.  #3 history of diabetes mellitus type 2: Continue sliding scale with coverage if p.o. intake is good tomorrow restart his home dose insulin. 4.  Hyperlipidemia: Continue statins 5.  History of AV malformation of the stomach requiring multiple EGDs and follows with Dr. Vira Agar before, hemoglobin is stable at 10.  All the records are reviewed and case discussed with ED provider. Management plans discussed with the patient, family and they are in agreement.  CODE STATUS: Full code, seen by palliative care before.  TOTAL TIME TAKING CARE OF THIS PATIENT: 75minutes.    Epifanio Lesches M.D on 06/18/2018 at 4:41 PM  Between 7am to 6pm - Pager - [phone]  After 6pm go to [email or web address] - password [email or web address] [facility]  Office  [phone]  CC: Primary care physician; Baxter Hire, MD  Note: This dictation was prepared with Dragon dictation along with smaller phrase  technology. Any transcriptional errors that result from this process are unintentional.

## 2018-06-18 NOTE — ED Notes (Signed)
Fecal occult positive. Pt had small amount of dried stool noted to bottom. Pt cleaned with warm wipes, chucks and brief placed under pt. Moved up towards head of bed and sat up. Verbalized comfort.

## 2018-06-19 DIAGNOSIS — K7290 Hepatic failure, unspecified without coma: Secondary | ICD-10-CM | POA: Diagnosis not present

## 2018-06-19 LAB — BASIC METABOLIC PANEL
Anion gap: 7 (ref 5–15)
BUN: 27 mg/dL — ABNORMAL HIGH (ref 8–23)
CHLORIDE: 105 mmol/L (ref 98–111)
CO2: 26 mmol/L (ref 22–32)
Calcium: 9.1 mg/dL (ref 8.9–10.3)
Creatinine, Ser: 2.11 mg/dL — ABNORMAL HIGH (ref 0.61–1.24)
GFR calc non Af Amer: 31 mL/min — ABNORMAL LOW (ref >60)
GFR, EST AFRICAN AMERICAN: 36 mL/min — AB (ref >60)
Glucose, Bld: 347 mg/dL — ABNORMAL HIGH (ref 70–99)
Potassium: 3.7 mmol/L (ref 3.5–5.1)
SODIUM: 138 mmol/L (ref 135–145)

## 2018-06-19 LAB — CBC
HEMATOCRIT: 26.2 % — AB (ref 40.0–52.0)
Hemoglobin: 9.6 g/dL — ABNORMAL LOW (ref 13.0–18.0)
MCH: 33.3 pg (ref 26.0–34.0)
MCHC: 36.5 g/dL — ABNORMAL HIGH (ref 32.0–36.0)
MCV: 91.2 fL (ref 80.0–100.0)
Platelets: 74 10*3/uL — ABNORMAL LOW (ref 150–440)
RBC: 2.88 MIL/uL — AB (ref 4.40–5.90)
RDW: 14.9 % — ABNORMAL HIGH (ref 11.5–14.5)
WBC: 4.8 10*3/uL (ref 3.8–10.6)

## 2018-06-19 LAB — GLUCOSE, CAPILLARY
Glucose-Capillary: 293 mg/dL — ABNORMAL HIGH (ref 70–99)
Glucose-Capillary: 307 mg/dL — ABNORMAL HIGH (ref 70–99)

## 2018-06-19 MED ORDER — INSULIN ASPART PROT & ASPART (70-30 MIX) 100 UNIT/ML ~~LOC~~ SUSP
20.0000 [IU] | Freq: Two times a day (BID) | SUBCUTANEOUS | Status: DC
  Administered 2018-06-19: 08:00:00 20 [IU] via SUBCUTANEOUS
  Filled 2018-06-19: qty 10

## 2018-06-19 NOTE — Discharge Summary (Signed)
[place] at [place] NAME: Corey West    MR#:  [id number]  DATE OF BIRTH:  [birth year]  DATE OF ADMISSION:  06/18/2018 ADMITTING PHYSICIAN: Epifanio Lesches, MD  DATE OF DISCHARGE: 06/19/2018  PRIMARY CARE PHYSICIAN: Baxter Hire, MD    ADMISSION DIAGNOSIS:  Weakness [R53.1] Increased ammonia level [R79.89]  DISCHARGE DIAGNOSIS:  Active Problems:   Hepatic encephalopathy ([place])   SECONDARY DIAGNOSIS:   Past Medical History:  Diagnosis Date  . Anemia   . Anxiety    controlled;   . Arthritis   . AVM (arteriovenous malformation) of stomach, acquired with hemorrhage   . Barrett's esophagus   . Chronic kidney disease    renal infufficiency  . Cirrhosis ([place])   . Depression    controlled;   Marland Kitchen Diabetes mellitus without complication ([place])    not controlled, taking insulin but sugar continues to run high;   . Edema   . Esophageal varices ([place])   . GERD (gastroesophageal reflux disease)   . History of hiatal hernia   . Hyperlipidemia   . Hypertension    controlled well;   . 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 Obesity   . Pancytopenia ([place])   . Polyp, stomach    with chronic blood loss  . Sleep apnea    does not wear a cpap, Medicare would not pay for it   . Venous stasis dermatitis of both lower extremities   . Venous stasis of both lower extremities    with cellulitis    HOSPITAL COURSE:   67 year old male with chronic kidney disease stage III and liver cirrhosis who presented to the ER with generalized weakness.  1.  Acute on chronic kidney injury with chronic kidney disease stage III: This is due to prerenal azotemia and has improved.  Patient's baseline creatinine 1.9-2.0.  Patient will close follow-up with his nephrologist.   2.  Liver cirrhosis: Patient will continue outpatient regimen  3.  Diabetes: Continue ADA diet with outpatient regimen  4.  Chronic pancytopenia: Patient is followed by oncology  5.  History  of AVMs with anemia of chronic disease: Patient's hemoglobin is stable and he follows up with Dr. Vira Agar  DISCHARGE CONDITIONS AND DIET:   Stable for discharge on diabetic diet  CONSULTS OBTAINED:    DRUG ALLERGIES:  No Known Allergies  DISCHARGE MEDICATIONS:   Allergies as of 06/19/2018   No Known Allergies     Medication List    STOP taking these medications   CENTRUM SILVER 50+MEN Tabs   CONTOUR NEXT TEST test strip Generic drug:  glucose blood   furosemide 40 MG tablet Commonly known as:  LASIX   insulin aspart protamine- aspart (70-30) 100 UNIT/ML injection Commonly known as:  NOVOLOG MIX 70/30   lactulose 10 GM/15ML solution Commonly known as:  CHRONULAC   LORazepam 0.5 MG tablet Commonly known as:  ATIVAN   Magnesium Oxide 400 (240 Mg) MG Tabs   metolazone 2.5 MG tablet Commonly known as:  ZAROXOLYN   omeprazole 40 MG capsule Commonly known as:  PRILOSEC   pantoprazole 40 MG tablet Commonly known as:  PROTONIX   potassium chloride SA 20 MEQ tablet Commonly known as:  K-DUR,KLOR-CON   ranitidine 150 MG tablet Commonly known as:  ZANTAC   sertraline 100 MG tablet Commonly known as:  ZOLOFT   spironolactone 25 MG tablet Commonly known as:  ALDACTONE   ULTICARE MINI PEN NEEDLES 31G X 6 MM  Misc Generic drug:  Insulin Pen Needle     TAKE these medications   rifaximin 550 MG Tabs tablet Commonly known as:  XIFAXAN Take 1 tablet (550 mg total) by mouth 2 (two) times daily.         Today   CHIEF COMPLAINT:  Patient is ready to be discharged home today no acute events overnight reported   VITAL SIGNS:  Blood pressure (!) 146/60, pulse 75, temperature 97.9 F (36.6 C), resp. rate 17, height 5\' 8"  (1.727 m), weight 123.2 kg, SpO2 100 %.   REVIEW OF SYSTEMS:  Review of Systems  Constitutional: Negative.  Negative for chills, fever and malaise/fatigue.  HENT: Negative.  Negative for ear discharge, ear pain, hearing loss, nosebleeds  and sore throat.   Eyes: Negative.  Negative for blurred vision and pain.  Respiratory: Negative.  Negative for cough, hemoptysis, shortness of breath and wheezing.   Cardiovascular: Negative.  Negative for chest pain, palpitations and leg swelling.  Gastrointestinal: Negative.  Negative for abdominal pain, blood in stool, diarrhea, nausea and vomiting.  Genitourinary: Negative.  Negative for dysuria.  Musculoskeletal: Negative.  Negative for back pain.  Skin: Negative.   Neurological: Negative for dizziness, tremors, speech change, focal weakness, seizures and headaches.  Endo/Heme/Allergies: Negative.  Does not bruise/bleed easily.  Psychiatric/Behavioral: Negative.  Negative for depression, hallucinations and suicidal ideas.     PHYSICAL EXAMINATION:  GENERAL:  67 y.o.-year-old patient lying in the bed with no acute distress.  NECK:  Supple, no jugular venous distention. No thyroid enlargement, no tenderness.  LUNGS: Normal breath sounds bilaterally, no wheezing, rales,rhonchi  No use of accessory muscles of respiration.  CARDIOVASCULAR: S1, S2 normal. No murmurs, rubs, or gallops.  ABDOMEN: Soft, non-tender, non-distended. Bowel sounds present. No organomegaly or mass.  EXTREMITIES: No pedal edema, cyanosis, or clubbing.  PSYCHIATRIC: The patient is alert and oriented x 3.  SKIN: No obvious rash, lesion, or ulcer.   DATA REVIEW:   CBC Recent Labs  Lab 06/19/18 0344  WBC 4.8  HGB 9.6*  HCT 26.2*  PLT 74*    Chemistries  Recent Labs  Lab 06/18/18 1118 06/19/18 0344  NA 137 138  K 3.5 3.7  CL 101 105  CO2 27 26  GLUCOSE 254* 347*  BUN 25* 27*  CREATININE 2.17* 2.11*  CALCIUM 9.5 9.1  AST 28  --   ALT 20  --   ALKPHOS 79  --   BILITOT 1.0  --     Cardiac Enzymes Recent Labs  Lab 06/18/18 1118  TROPONINI <0.03    Microbiology Results  @[email or web address] @  RADIOLOGY:  Dg Chest 2 View  Result Date: 06/18/2018 CLINICAL DATA:  Dizziness and weakness since last  night. EXAM: CHEST - 2 VIEW COMPARISON:  12/31/2017 and 12/25/2017 portable studies. FINDINGS: The heart size and mediastinal contours are stable. There is stable mild elevation of the left hemidiaphragm. The lungs are clear. There is no pleural effusion or pneumothorax. No acute osseous findings are demonstrated. Telemetry leads overlie the chest. IMPRESSION: Stable chest.  No acute cardiopulmonary process. Electronically Signed   By: Richardean Sale M.D.   On: 06/18/2018 14:17      Allergies as of 06/19/2018   No Known Allergies     Medication List    STOP taking these medications   CENTRUM SILVER 50+MEN Tabs   CONTOUR NEXT TEST test strip Generic drug:  glucose blood   furosemide 40 MG tablet Commonly known as:  LASIX  insulin aspart protamine- aspart (70-30) 100 UNIT/ML injection Commonly known as:  NOVOLOG MIX 70/30   lactulose 10 GM/15ML solution Commonly known as:  CHRONULAC   LORazepam 0.5 MG tablet Commonly known as:  ATIVAN   Magnesium Oxide 400 (240 Mg) MG Tabs   metolazone 2.5 MG tablet Commonly known as:  ZAROXOLYN   omeprazole 40 MG capsule Commonly known as:  PRILOSEC   pantoprazole 40 MG tablet Commonly known as:  PROTONIX   potassium chloride SA 20 MEQ tablet Commonly known as:  K-DUR,KLOR-CON   ranitidine 150 MG tablet Commonly known as:  ZANTAC   sertraline 100 MG tablet Commonly known as:  ZOLOFT   spironolactone 25 MG tablet Commonly known as:  ALDACTONE   ULTICARE MINI PEN NEEDLES 31G X 6 MM Misc Generic drug:  Insulin Pen Needle     TAKE these medications   rifaximin 550 MG Tabs tablet Commonly known as:  XIFAXAN Take 1 tablet (550 mg total) by mouth 2 (two) times daily.         Management plans discussed with the patient and he is in agreement. Stable for discharge home  Patient should follow up with pcp  CODE STATUS:     Code Status Orders  (From admission, onward)         Start     Ordered   06/18/18 1609  Full  code  Continuous     06/18/18 1609        Code Status History    Date Active Date Inactive Code Status Order ID Comments User Context   12/31/2017 1856 01/01/2018 1205 Full Code [id number]  Saundra Shelling, MD Inpatient   12/25/2017 1456 12/26/2017 1618 Full Code [id number]  Epifanio Lesches, MD ED   12/16/2017 0921 12/18/2017 1635 DNR [id number]  Max Sane, MD Inpatient   12/16/2017 0305 12/16/2017 0921 Full Code [id number]  Salary, Avel Peace, MD ED   12/02/2017 2003 12/05/2017 2000 Full Code [id number]  Saundra Shelling, MD Inpatient   11/21/2017 1948 11/23/2017 1958 Full Code [id number]  Loletha Grayer, MD ED   10/23/2017 1405 10/26/2017 1402 Full Code [id number]  Bettey Costa, MD Inpatient   10/18/2017 0058 10/19/2017 1806 Full Code [id number]  Saundra Shelling, MD Inpatient   10/09/2017 2139 10/11/2017 1806 Full Code [id number]  [name], MD Inpatient   09/03/2017 2124 09/05/2017 1552 Full Code [id number]  Nicholes Mango, MD Inpatient   06/26/2017 1119 06/28/2017 1748 Full Code [id number]  Henreitta Leber, MD Inpatient   02/21/2017 2007 02/22/2017 1803 Full Code [id number]  [name], MD Inpatient   07/20/2015 1245 07/22/2015 2217 Full Code [id number]  Nicholes Mango, MD Inpatient   02/08/2015 2331 02/11/2015 1943 Full Code [id number]  Hower, Aaron Mose, MD ED      TOTAL TIME TAKING CARE OF THIS PATIENT: 38 minutes.    Note: This dictation was prepared with Dragon dictation along with smaller phrase technology. Any transcriptional errors that result from this process are unintentional.  Karalynn Cottone M.D on 06/19/2018 at 10:02 AM  Between 7am to 6pm - Pager - [phone] After 6pm go to [email or web address] - password [email or web address]  Office  [phone]  CC: Primary care physician; Baxter Hire, MD

## 2018-06-19 NOTE — Care Management Note (Signed)
Case Management Note  Patient Details  Name: Corey West MRN: [id number] Date of Birth: Feb 16, 1951  Subjective/Objective:    Admitted to [facility] with diagnosis of hepatic encephalopathy under observation status. Lives alone. Niece is Doristine Johns [phone]). Seen Dr. [name] a few weeks ago. Prescriptions are filled at [facility]. Rolling walker and cane in the home.                Action/Plan: Wants to go home per Emergency Rescue unit. Discussed that he may receive a bill for this service. "I have never received a bill." Medical Necessary form filled out   Expected Discharge Date:  06/19/18               Expected Discharge Plan:     In-House Referral:     Discharge planning Services     Post Acute Care Choice:    Choice offered to:     DME Arranged:    DME Agency:     HH Arranged:    HH Agency:     Status of Service:     If discussed at [employer], dates discussed:    Additional Comments:  Shelbie Ammons, RN MSN CCM Care Management [phone] 06/19/2018, 10:57 AM

## 2018-06-19 NOTE — Care Management Obs Status (Signed)
[place] NOTIFICATION   Patient Details  Name: CASHMERE HARMES MRN: [id number] Date of Birth: Aug 28, 1951   Medicare Observation Status Notification Given:  Yes Explained to patient    Shelbie Ammons, RN 06/19/2018, 8:30 AM

## 2018-06-19 NOTE — Progress Notes (Signed)
Family Meeting Note  Advance Directive:yes  Today a meeting took place with the Patient.  The following clinical team members were present during this meeting:MD  The following were discussed:Patient's diagnosis: Acute on chronic kidney disease stage III Anemia of chronic disease and pancytopenia with liver cirrhosis , Patient's progosis: > 12 months and Goals for treatment: Full Code  Additional follow-up to be provided: Patient has updated advanced directives  Time spent during discussion:16 minutes  Corey Wach, MD

## 2018-06-19 NOTE — Progress Notes (Signed)
Patient discharged home on self, discharge instruction given to patient. All questions answered and concerns addressed. Patient verbalized understanding.

## 2018-06-19 NOTE — Plan of Care (Signed)
  Problem: Education: Goal: Knowledge of General Education information will improve Description Including pain rating scale, medication(s)/side effects and non-pharmacologic comfort measures Outcome: Adequate for Discharge   Problem: Health Behavior/Discharge Planning: Goal: Ability to manage health-related needs will improve Outcome: Adequate for Discharge   Problem: Clinical Measurements: Goal: Ability to maintain clinical measurements within normal limits will improve Outcome: Adequate for Discharge   Problem: Education: Goal: Ability to describe self-care measures that may prevent or decrease complications (Diabetes Survival Skills Education) will improve Outcome: Adequate for Discharge   Problem: Fluid Volume: Goal: Ability to maintain a balanced intake and output will improve Outcome: Adequate for Discharge   Problem: Health Behavior/Discharge Planning: Goal: Ability to identify and utilize available resources and services will improve Outcome: Adequate for Discharge

## 2018-06-22 NOTE — Progress Notes (Signed)
[place]  [phone] [phone]  ID: Corey West OB: 05/02/1951  MR#: [id number]  JOA#:[id number]  Patient Care Team: Baxter Hire, MD as PCP - General (Internal Medicine)  CHIEF COMPLAINT: Iron deficiency anemia, thrombocytopenia  INTERVAL HISTORY: Patient returns to clinic today for repeat laboratory work, further evaluation, and consideration of additional IV Feraheme.  He was recently discharged from the hospital after being admitted with confusion and hepatic encephalopathy.  He continues to have weakness and fatigue, but feels improved since his discharge. He has no neurologic complaints.  He denies any fevers. He has a fair appetite, but denies weight loss.  He denies any chest pain, shortness of breath, cough, or hemoptysis. He denies any nausea, vomiting, constipation, or diarrhea.  He has no melena or hematochezia. He has no urinary complaints.  Patient offers no further specific complaints today.  REVIEW OF SYSTEMS:   Review of Systems  Constitutional: Positive for malaise/fatigue. Negative for fever and weight loss.  Respiratory: Negative.  Negative for cough, hemoptysis and shortness of breath.   Cardiovascular: Negative.  Negative for chest pain and leg swelling.  Gastrointestinal: Negative.  Negative for abdominal pain, blood in stool and melena.  Genitourinary: Negative.  Negative for hematuria.  Musculoskeletal: Negative.  Negative for back pain.  Skin: Negative.  Negative for rash.  Neurological: Positive for weakness. Negative for sensory change, focal weakness and headaches.  Psychiatric/Behavioral: Negative.  The patient is not nervous/anxious.     As per HPI. Otherwise, a complete review of systems is negative.  PAST MEDICAL HISTORY: Past Medical History:  Diagnosis Date  . Anemia   . Anxiety    controlled;   . Arthritis   . AVM (arteriovenous malformation) of stomach, acquired with hemorrhage   . Barrett's  esophagus   . Chronic kidney disease    renal infufficiency  . Cirrhosis ([place])   . Depression    controlled;   Marland Kitchen Diabetes mellitus without complication ([place])    not controlled, taking insulin but sugar continues to run high;   . Edema   . Esophageal varices ([place])   . GERD (gastroesophageal reflux disease)   . History of hiatal hernia   . Hyperlipidemia   . Hypertension    controlled well;   . Nephropathy, diabetic ([place])   . Obesity   . Pancytopenia ([place])   . Polyp, stomach    with chronic blood loss  . Sleep apnea    does not wear a cpap, Medicare would not pay for it   . Venous stasis dermatitis of both lower extremities   . Venous stasis of both lower extremities    with cellulitis    PAST SURGICAL HISTORY: Past Surgical History:  Procedure Laterality Date  . ESOPHAGOGASTRODUODENOSCOPY N/A 02/09/2015   Procedure: ESOPHAGOGASTRODUODENOSCOPY (EGD);  Surgeon: Manya Silvas, MD;  [facility] END[facility]COPY;  Service: Endoscopy;  Laterality: N/A;  . ESOPHAGOGASTRODUODENOSCOPY N/A 07/22/2015   Procedure: ESOPHAGOGASTRODUODENOSCOPY (EGD);  Surgeon: Manya Silvas, MD;  Location: [facility] ENDOSCOPY;  Service: Endoscopy;  Laterality: N/A;  . ESOPHAGOGASTRODUODENOSCOPY N/A 06/26/2017   Procedure: ESOPHAGOGASTRODUODENOSCOPY (EGD);  Surgeon: Manya Silvas, MD;  Location: [facility] ENDOSCOPY;  Service: Endoscopy;  Laterality: N/A;  . ESOPHAGOGASTRODUODENOSCOPY N/A 06/27/2017   Procedure: ESOPHAGOGASTRODUODENOSCOPY (EGD);  Surgeon: Manya Silvas, MD;  Location: [facility] ENDOSCOPY;  Service: Endoscopy;  Laterality: N/A;  . ESOPHAGOGASTRODUODENOSCOPY N/A 06/28/2017   Procedure: ESOPHAGOGASTRODUODENOSCOPY (EGD);  Surgeon: Manya Silvas, MD;  Location: [facility] ENDOSCOPY;  Service: Endoscopy;  Laterality: N/A;  . ESOPHAGOGASTRODUODENOSCOPY Bilateral 10/11/2017   Procedure: ESOPHAGOGASTRODUODENOSCOPY (EGD);  Surgeon: Manya Silvas, MD;  Location: [facility] ENDOSCOPY;  Service: Endoscopy;  Laterality:  Bilateral;  . ESOPHAGOGASTRODUODENOSCOPY N/A 12/04/2017   Procedure: ESOPHAGOGASTRODUODENOSCOPY (EGD);  Surgeon: Manya Silvas, MD;  Location: [facility] ENDOSCOPY;  Service: Endoscopy;  Laterality: N/A;  . ESOPHAGOGASTRODUODENOSCOPY (EGD) WITH PROPOFOL N/A 07/20/2015   Procedure: ESOPHAGOGASTRODUODENOSCOPY (EGD) WITH PROPOFOL;  Surgeon: Manya Silvas, MD;  Location: [facility] ENDOSCOPY;  Service: Endoscopy;  Laterality: N/A;  . ESOPHAGOGASTRODUODENOSCOPY (EGD) WITH PROPOFOL N/A 09/16/2015   Procedure: ESOPHAGOGASTRODUODENOSCOPY (EGD) WITH PROPOFOL;  Surgeon: Manya Silvas, MD;  Location: [facility] ENDOSCOPY;  Service: Endoscopy;  Laterality: N/A;  . ESOPHAGOGASTRODUODENOSCOPY (EGD) WITH PROPOFOL N/A 03/16/2016   Procedure: ESOPHAGOGASTRODUODENOSCOPY (EGD) WITH PROPOFOL;  Surgeon: Manya Silvas, MD;  Location: [facility] ENDOSCOPY;  Service: Endoscopy;  Laterality: N/A;  . ESOPHAGOGASTRODUODENOSCOPY (EGD) WITH PROPOFOL N/A 09/14/2016   Procedure: ESOPHAGOGASTRODUODENOSCOPY (EGD) WITH PROPOFOL;  Surgeon: Manya Silvas, MD;  Location: [facility] ENDOSCOPY;  Service: Endoscopy;  Laterality: N/A;  . ESOPHAGOGASTRODUODENOSCOPY (EGD) WITH PROPOFOL N/A 11/05/2016   Procedure: ESOPHAGOGASTRODUODENOSCOPY (EGD) WITH PROPOFOL;  Surgeon: Manya Silvas, MD;  Location[facility] ENDOSCOPY;  Service: Endoscopy;  Laterality: N/A;  . ESOPHAGOGASTRODUODENOSCOPY (EGD) WITH PROPOFOL N/A 02/06/2017   Procedure: ESOPHAGOGASTRODUODENOSCOPY (EGD) WITH PROPOFOL;  Surgeon: Manya Silvas, MD;  Location: [facility] ENDOSCOPY;  Service: Endoscopy;  Laterality: N/A;  . ESOPHAGOGASTRODUODENOSCOPY (EGD) WITH PROPOFOL N/A 04/24/2017   Procedure: ESOPHAGOGASTRODUODENOSCOPY (EGD) WITH PROPOFOL;  Surgeon: Manya Silvas, MD;  Location: [facility] ENDOSCOPY;  Service: Endoscopy;  Laterality: N/A;  . ESOPHAGOGASTRODUODENOSCOPY (EGD) WITH PROPOFOL N/A 07/31/2017   Procedure: ESOPHAGOGASTRODUODENOSCOPY (EGD) WITH PROPOFOL;  Surgeon: Manya Silvas, MD;   Location: [facility] ENDOSCOPY;  Service: Endoscopy;  Laterality: N/A;  . GIVENS CAPSULE STUDY N/A 12/05/2017   Procedure: GIVENS CAPSULE STUDY;  Surgeon: Manya Silvas, MD;  Location: [facility] ENDOSCOPY;  Service: Endoscopy;  Laterality: N/A;  . TONSILLECTOMY    . TONSILLECTOMY AND ADENOIDECTOMY    . ULNAR NERVE TRANSPOSITION    . UVULOPALATOPHARYNGOPLASTY      FAMILY HISTORY: Family History  Problem Relation Age of Onset  . Diabetes Other   . Transient ischemic attack Father   . CAD Father     ADVANCED DIRECTIVES (Y/N):  N  HEALTH MAINTENANCE: Social History   Tobacco Use  . Smoking status: Current Some Day Smoker    Types: Cigars  . Smokeless tobacco: Never Used  . Tobacco comment: smoke 1 at night occasionally;   Substance Use Topics  . Alcohol use: No    Comment: stopped 12 years ago  . Drug use: No     Colonoscopy:  PAP:  Bone density:  Lipid panel:  No Known Allergies  Current Outpatient Medications  Medication Sig Dispense Refill  . docusate sodium (COLACE) 100 MG capsule Take 100 mg by mouth 2 (two) times daily.    . insulin aspart protamine- aspart (NOVOLOG MIX 70/30) (70-30) 100 UNIT/ML injection Inject into the skin.    Marland Kitchen lactulose (CEPHULAC) 20 g packet Take 20 g by mouth 3 (three) times daily.    Marland Kitchen LORazepam (ATIVAN) 0.5 MG tablet Take 0.5 mg by mouth every 8 (eight) hours.    . magnesium oxide (MAG-OX) 400 MG tablet Take 400 mg by mouth daily.    . Multiple Vitamin (MULTIVITAMIN) tablet Take 1 tablet by mouth daily.    Marland Kitchen omeprazole (PRILOSEC) 40 MG capsule Take 40 mg by mouth daily.    Marland Kitchen  Potassium Chloride ER 20 MEQ TBCR Take by mouth.    . ranitidine (ZANTAC) 150 MG capsule Take 150 mg by mouth 2 (two) times daily.    . rifaximin (XIFAXAN) 550 MG TABS tablet Take 1 tablet (550 mg total) by mouth 2 (two) times daily. 60 tablet 0  . sertraline (ZOLOFT) 25 MG tablet Take 25 mg by mouth daily.    . traZODone (DESYREL) 50 MG tablet Take 25 mg by mouth at  bedtime.     No current facility-administered medications for this visit.     OBJECTIVE: Vitals:   06/24/18 1341  BP: (!) 178/66  Pulse: 73  Resp: 18  Temp: 98 F (36.7 C)     Body mass index is 43.12 kg/m.    ECOG FS:0 - Asymptomatic  General: Well-developed, well-nourished, no acute distress. Eyes: Pink conjunctiva, anicteric sclera. HEENT: Normocephalic, moist mucous membranes. Lungs: Clear to auscultation bilaterally. Heart: Regular rate and rhythm. No rubs, murmurs, or gallops. Abdomen: Soft, nontender, nondistended. No organomegaly noted, normoactive bowel sounds. Musculoskeletal: No edema, cyanosis, or clubbing. Neuro: Alert, answering all questions appropriately. Cranial nerves grossly intact. Skin: No rashes or petechiae noted. Psych: Normal affect.  LAB RESULTS:  Lab Results  Component Value Date   NA 138 06/19/2018   K 3.7 06/19/2018   CL 105 06/19/2018   CO2 26 06/19/2018   GLUCOSE 347 (H) 06/19/2018   BUN 27 (H) 06/19/2018   CREATININE 2.11 (H) 06/19/2018   CALCIUM 9.1 06/19/2018   PROT 7.0 06/18/2018   ALBUMIN 3.4 (L) 06/18/2018   AST 28 06/18/2018   ALT 20 06/18/2018   ALKPHOS 79 06/18/2018   BILITOT 1.0 06/18/2018   GFRNONAA 31 (L) 06/19/2018   GFRAA 36 (L) 06/19/2018    Lab Results  Component Value Date   WBC 4.4 06/24/2018   NEUTROABS 3.1 06/24/2018   HGB 10.0 (L) 06/24/2018   HCT 28.0 (L) 06/24/2018   MCV 92.6 06/24/2018   PLT 88 (L) 06/24/2018   Lab Results  Component Value Date   IRON 126 06/24/2018   TIBC 317 06/24/2018   IRONPCTSAT 40 (H) 06/24/2018   Lab Results  Component Value Date   FERRITIN 37 06/24/2018     STUDIES: Dg Chest 2 View  Result Date: 06/18/2018 CLINICAL DATA:  Dizziness and weakness since last night. EXAM: CHEST - 2 VIEW COMPARISON:  12/31/2017 and 12/25/2017 portable studies. FINDINGS: The heart size and mediastinal contours are stable. There is stable mild elevation of the left hemidiaphragm. The lungs  are clear. There is no pleural effusion or pneumothorax. No acute osseous findings are demonstrated. Telemetry leads overlie the chest. IMPRESSION: Stable chest.  No acute cardiopulmonary process. Electronically Signed   By: Richardean Sale M.D.   On: 06/18/2018 14:17    ASSESSMENT: Iron deficiency anemia, thrombocytopenia  PLAN:    1.  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    Patient's most recent EGD was completed on December 01, 2017.  His hemoglobin is decreased, but slowly improving from his discharge.  Despite normal iron stores, will proceed with 510 mg IV Feraheme today given patient's history of GI bleeds.  Previously, the remainder of her laboratory work is either negative or within normal limits.  No further interventions as needed.  Return to clinic in 3 months with repeat laboratory work and further evaluation.   2.  Thrombocytopenia: Patient's platelet count is 88 today which is approximately his baseline.  Most likely multifactorial including secondary to his known cirrhosis.  No intervention is needed.  Patient does not require bone marrow biopsy.    I spent a total of 30 minutes face-to-face with the patient of which greater than 50% of the visit was spent in counseling and coordination of care as detailed above.   Patient expressed understanding and was in agreement with this plan. He also understands that He can call clinic at any time with any questions, concerns, or complaints.    Lloyd Huger, MD   06/28/2018 6:42 AM

## 2018-06-24 ENCOUNTER — Inpatient Hospital Stay: Payer: Medicare Other

## 2018-06-24 ENCOUNTER — Other Ambulatory Visit: Payer: Self-pay

## 2018-06-24 ENCOUNTER — Inpatient Hospital Stay: Payer: Medicare Other | Attending: Oncology

## 2018-06-24 ENCOUNTER — Inpatient Hospital Stay (HOSPITAL_BASED_OUTPATIENT_CLINIC_OR_DEPARTMENT_OTHER): Payer: Medicare Other | Admitting: Oncology

## 2018-06-24 ENCOUNTER — Encounter: Payer: Self-pay | Admitting: Oncology

## 2018-06-24 ENCOUNTER — Other Ambulatory Visit: Payer: Self-pay | Admitting: *Deleted

## 2018-06-24 VITALS — BP 178/66 | HR 73 | Temp 98.0°F | Resp 18 | Wt 283.6 lb

## 2018-06-24 VITALS — BP 150/72

## 2018-06-24 DIAGNOSIS — D509 Iron deficiency anemia, unspecified: Secondary | ICD-10-CM | POA: Diagnosis present

## 2018-06-24 DIAGNOSIS — I8500 Esophageal varices without bleeding: Secondary | ICD-10-CM

## 2018-06-24 DIAGNOSIS — F17210 Nicotine dependence, cigarettes, uncomplicated: Secondary | ICD-10-CM

## 2018-06-24 DIAGNOSIS — I10 Essential (primary) hypertension: Secondary | ICD-10-CM

## 2018-06-24 DIAGNOSIS — D649 Anemia, unspecified: Secondary | ICD-10-CM

## 2018-06-24 DIAGNOSIS — D500 Iron deficiency anemia secondary to blood loss (chronic): Secondary | ICD-10-CM

## 2018-06-24 DIAGNOSIS — D696 Thrombocytopenia, unspecified: Secondary | ICD-10-CM | POA: Diagnosis not present

## 2018-06-24 DIAGNOSIS — Q2733 Arteriovenous malformation of digestive system vessel: Secondary | ICD-10-CM

## 2018-06-24 DIAGNOSIS — K7460 Unspecified cirrhosis of liver: Secondary | ICD-10-CM

## 2018-06-24 DIAGNOSIS — E119 Type 2 diabetes mellitus without complications: Secondary | ICD-10-CM

## 2018-06-24 LAB — CBC WITH DIFFERENTIAL/PLATELET
BASOS ABS: 0 10*3/uL (ref 0–0.1)
BASOS PCT: 1 %
EOS ABS: 0.1 10*3/uL (ref 0–0.7)
EOS PCT: 2 %
HCT: 28 % — ABNORMAL LOW (ref 40.0–52.0)
Hemoglobin: 10 g/dL — ABNORMAL LOW (ref 13.0–18.0)
Lymphocytes Relative: 18 %
Lymphs Abs: 0.8 10*3/uL — ABNORMAL LOW (ref 1.0–3.6)
MCH: 33 pg (ref 26.0–34.0)
MCHC: 35.7 g/dL (ref 32.0–36.0)
MCV: 92.6 fL (ref 80.0–100.0)
Monocytes Absolute: 0.4 10*3/uL (ref 0.2–1.0)
Monocytes Relative: 9 %
Neutro Abs: 3.1 10*3/uL (ref 1.4–6.5)
Neutrophils Relative %: 70 %
PLATELETS: 88 10*3/uL — AB (ref 150–440)
RBC: 3.03 MIL/uL — ABNORMAL LOW (ref 4.40–5.90)
RDW: 14.7 % — AB (ref 11.5–14.5)
WBC: 4.4 10*3/uL (ref 3.8–10.6)

## 2018-06-24 LAB — FERRITIN: FERRITIN: 37 ng/mL (ref 24–336)

## 2018-06-24 LAB — IRON AND TIBC
IRON: 126 ug/dL (ref 45–182)
Saturation Ratios: 40 % — ABNORMAL HIGH (ref 17.9–39.5)
TIBC: 317 ug/dL (ref 250–450)
UIBC: 191 ug/dL

## 2018-06-24 MED ORDER — SODIUM CHLORIDE 0.9 % IV SOLN
Freq: Once | INTRAVENOUS | Status: AC
  Administered 2018-06-24: 14:00:00 via INTRAVENOUS
  Filled 2018-06-24: qty 250

## 2018-06-24 MED ORDER — SODIUM CHLORIDE 0.9 % IV SOLN
510.0000 mg | Freq: Once | INTRAVENOUS | Status: AC
  Administered 2018-06-24: 510 mg via INTRAVENOUS
  Filled 2018-06-24: qty 17

## 2018-06-24 NOTE — Progress Notes (Signed)
Patient here today for follow up and feraheme.

## 2018-07-11 ENCOUNTER — Encounter: Admission: RE | Payer: Self-pay | Source: Ambulatory Visit

## 2018-07-11 ENCOUNTER — Ambulatory Visit
Admission: RE | Admit: 2018-07-11 | Payer: Medicare Other | Source: Ambulatory Visit | Admitting: Unknown Physician Specialty

## 2018-07-11 SURGERY — ESOPHAGOGASTRODUODENOSCOPY (EGD) WITH PROPOFOL
Anesthesia: General

## 2018-07-18 ENCOUNTER — Emergency Department: Payer: Medicare Other

## 2018-07-18 ENCOUNTER — Inpatient Hospital Stay
Admission: EM | Admit: 2018-07-18 | Discharge: 2018-07-21 | DRG: 872 | Disposition: A | Discharge status: Home / Self Care | Payer: Medicare Other | Attending: Specialist | Admitting: Specialist

## 2018-07-18 ENCOUNTER — Other Ambulatory Visit: Payer: Self-pay

## 2018-07-18 DIAGNOSIS — E1122 Type 2 diabetes mellitus with diabetic chronic kidney disease: Secondary | ICD-10-CM | POA: Diagnosis present

## 2018-07-18 DIAGNOSIS — E876 Hypokalemia: Secondary | ICD-10-CM | POA: Diagnosis not present

## 2018-07-18 DIAGNOSIS — N390 Urinary tract infection, site not specified: Secondary | ICD-10-CM | POA: Diagnosis present

## 2018-07-18 DIAGNOSIS — Z833 Family history of diabetes mellitus: Secondary | ICD-10-CM | POA: Diagnosis not present

## 2018-07-18 DIAGNOSIS — I83018 Varicose veins of right lower extremity with ulcer other part of lower leg: Secondary | ICD-10-CM | POA: Diagnosis present

## 2018-07-18 DIAGNOSIS — R6520 Severe sepsis without septic shock: Secondary | ICD-10-CM | POA: Diagnosis present

## 2018-07-18 DIAGNOSIS — K219 Gastro-esophageal reflux disease without esophagitis: Secondary | ICD-10-CM | POA: Diagnosis present

## 2018-07-18 DIAGNOSIS — I129 Hypertensive chronic kidney disease with stage 1 through stage 4 chronic kidney disease, or unspecified chronic kidney disease: Secondary | ICD-10-CM | POA: Diagnosis present

## 2018-07-18 DIAGNOSIS — Z6841 Body Mass Index (BMI) 40.0 and over, adult: Secondary | ICD-10-CM | POA: Diagnosis not present

## 2018-07-18 DIAGNOSIS — A419 Sepsis, unspecified organism: Principal | ICD-10-CM | POA: Diagnosis present

## 2018-07-18 DIAGNOSIS — Z720 Tobacco use: Secondary | ICD-10-CM

## 2018-07-18 DIAGNOSIS — E785 Hyperlipidemia, unspecified: Secondary | ICD-10-CM | POA: Diagnosis present

## 2018-07-18 DIAGNOSIS — E669 Obesity, unspecified: Secondary | ICD-10-CM | POA: Diagnosis present

## 2018-07-18 DIAGNOSIS — N184 Chronic kidney disease, stage 4 (severe): Secondary | ICD-10-CM | POA: Diagnosis present

## 2018-07-18 DIAGNOSIS — L03116 Cellulitis of left lower limb: Secondary | ICD-10-CM | POA: Diagnosis not present

## 2018-07-18 DIAGNOSIS — G4733 Obstructive sleep apnea (adult) (pediatric): Secondary | ICD-10-CM | POA: Diagnosis present

## 2018-07-18 DIAGNOSIS — Z794 Long term (current) use of insulin: Secondary | ICD-10-CM

## 2018-07-18 DIAGNOSIS — M1990 Unspecified osteoarthritis, unspecified site: Secondary | ICD-10-CM | POA: Diagnosis present

## 2018-07-18 DIAGNOSIS — L8990 Pressure ulcer of unspecified site, unspecified stage: Secondary | ICD-10-CM

## 2018-07-18 DIAGNOSIS — K7460 Unspecified cirrhosis of liver: Secondary | ICD-10-CM | POA: Diagnosis present

## 2018-07-18 DIAGNOSIS — T502X5A Adverse effect of carbonic-anhydrase inhibitors, benzothiadiazides and other diuretics, initial encounter: Secondary | ICD-10-CM | POA: Diagnosis not present

## 2018-07-18 DIAGNOSIS — Z79899 Other long term (current) drug therapy: Secondary | ICD-10-CM | POA: Diagnosis not present

## 2018-07-18 DIAGNOSIS — Z8249 Family history of ischemic heart disease and other diseases of the circulatory system: Secondary | ICD-10-CM | POA: Diagnosis not present

## 2018-07-18 DIAGNOSIS — I83028 Varicose veins of left lower extremity with ulcer other part of lower leg: Secondary | ICD-10-CM | POA: Diagnosis present

## 2018-07-18 DIAGNOSIS — F329 Major depressive disorder, single episode, unspecified: Secondary | ICD-10-CM | POA: Diagnosis present

## 2018-07-18 DIAGNOSIS — N3000 Acute cystitis without hematuria: Secondary | ICD-10-CM

## 2018-07-18 DIAGNOSIS — E119 Type 2 diabetes mellitus without complications: Secondary | ICD-10-CM

## 2018-07-18 LAB — CBC WITH DIFFERENTIAL/PLATELET
ABS IMMATURE GRANULOCYTES: 0.14 10*3/uL — AB (ref 0.00–0.07)
Basophils Absolute: 0 10*3/uL (ref 0.0–0.1)
Basophils Relative: 0 %
EOS PCT: 1 %
Eosinophils Absolute: 0.1 10*3/uL (ref 0.0–0.5)
HEMATOCRIT: 29.9 % — AB (ref 39.0–52.0)
Hemoglobin: 11.2 g/dL — ABNORMAL LOW (ref 13.0–17.0)
Immature Granulocytes: 1 %
Lymphocytes Relative: 3 %
Lymphs Abs: 0.5 10*3/uL — ABNORMAL LOW (ref 0.7–4.0)
MCH: 33.1 pg (ref 26.0–34.0)
MCHC: 37.5 g/dL — AB (ref 30.0–36.0)
MCV: 88.5 fL (ref 80.0–100.0)
MONO ABS: 1 10*3/uL (ref 0.1–1.0)
MONOS PCT: 7 %
NEUTROS ABS: 12.1 10*3/uL — AB (ref 1.7–7.7)
Neutrophils Relative %: 88 %
Platelets: 83 10*3/uL — ABNORMAL LOW (ref 150–400)
RBC: 3.38 MIL/uL — AB (ref 4.22–5.81)
RDW: 13.5 % (ref 11.5–15.5)
WBC: 12.1 10*3/uL — AB (ref 4.0–10.5)
nRBC: 0 % (ref 0.0–0.2)

## 2018-07-18 LAB — COMPREHENSIVE METABOLIC PANEL
ALK PHOS: 73 U/L (ref 38–126)
ALT: 26 U/L (ref 0–44)
AST: 34 U/L (ref 15–41)
Albumin: 3.2 g/dL — ABNORMAL LOW (ref 3.5–5.0)
Anion gap: 10 (ref 5–15)
BUN: 29 mg/dL — AB (ref 8–23)
CALCIUM: 9.7 mg/dL (ref 8.9–10.3)
CO2: 28 mmol/L (ref 22–32)
CREATININE: 2.23 mg/dL — AB (ref 0.61–1.24)
Chloride: 97 mmol/L — ABNORMAL LOW (ref 98–111)
GFR, EST AFRICAN AMERICAN: 34 mL/min — AB (ref >60)
GFR, EST NON AFRICAN AMERICAN: 29 mL/min — AB (ref >60)
Glucose, Bld: 336 mg/dL — ABNORMAL HIGH (ref 70–99)
Potassium: 3 mmol/L — ABNORMAL LOW (ref 3.5–5.1)
Sodium: 135 mmol/L (ref 135–145)
Total Bilirubin: 1 mg/dL (ref 0.3–1.2)
Total Protein: 6.7 g/dL (ref 6.5–8.1)

## 2018-07-18 LAB — BLOOD GAS, VENOUS
Acid-Base Excess: 6.9 mmol/L — ABNORMAL HIGH (ref 0.0–2.0)
BICARBONATE: 30.3 mmol/L — AB (ref 20.0–28.0)
O2 Saturation: 95 %
PATIENT TEMPERATURE: 37
PH VEN: 7.51 — AB (ref 7.250–7.430)
pCO2, Ven: 38 mmHg — ABNORMAL LOW (ref 44.0–60.0)
pO2, Ven: 68 mmHg — ABNORMAL HIGH (ref 32.0–45.0)

## 2018-07-18 LAB — URINALYSIS, ROUTINE W REFLEX MICROSCOPIC
BILIRUBIN URINE: NEGATIVE
Ketones, ur: NEGATIVE mg/dL
Nitrite: NEGATIVE
PROTEIN: 100 mg/dL — AB
Specific Gravity, Urine: 1.013 (ref 1.005–1.030)
pH: 5 (ref 5.0–8.0)

## 2018-07-18 LAB — PROTIME-INR
INR: 1.19
Prothrombin Time: 15 seconds (ref 11.4–15.2)

## 2018-07-18 LAB — LIPASE, BLOOD: Lipase: 47 U/L (ref 11–51)

## 2018-07-18 LAB — LACTIC ACID, PLASMA: Lactic Acid, Venous: 2.3 mmol/L (ref 0.5–1.9)

## 2018-07-18 MED ORDER — SODIUM CHLORIDE 0.9 % IV SOLN
2.0000 g | INTRAVENOUS | Status: DC
  Administered 2018-07-19 – 2018-07-21 (×3): 2 g via INTRAVENOUS
  Filled 2018-07-18 (×3): qty 2

## 2018-07-18 MED ORDER — VANCOMYCIN HCL IN DEXTROSE 1-5 GM/200ML-% IV SOLN
1000.0000 mg | Freq: Once | INTRAVENOUS | Status: AC
  Administered 2018-07-18: 1000 mg via INTRAVENOUS
  Filled 2018-07-18: qty 200

## 2018-07-18 MED ORDER — FAMOTIDINE 20 MG PO TABS
20.0000 mg | ORAL_TABLET | Freq: Two times a day (BID) | ORAL | Status: DC
  Administered 2018-07-19 – 2018-07-21 (×5): 20 mg via ORAL
  Filled 2018-07-18 (×5): qty 1

## 2018-07-18 MED ORDER — VANCOMYCIN HCL 10 G IV SOLR
1250.0000 mg | INTRAVENOUS | Status: DC
  Administered 2018-07-19 – 2018-07-21 (×3): 1250 mg via INTRAVENOUS
  Filled 2018-07-18 (×3): qty 1250

## 2018-07-18 MED ORDER — LORAZEPAM 0.5 MG PO TABS
0.5000 mg | ORAL_TABLET | Freq: Three times a day (TID) | ORAL | Status: DC
  Administered 2018-07-19 – 2018-07-21 (×7): 0.5 mg via ORAL
  Filled 2018-07-18 (×7): qty 1

## 2018-07-18 MED ORDER — ONDANSETRON HCL 4 MG PO TABS: 4.0000 mg | ORAL_TABLET | Freq: Four times a day (QID) | ORAL | Status: DC | PRN

## 2018-07-18 MED ORDER — OXYCODONE HCL 5 MG PO TABS: 5.0000 mg | ORAL_TABLET | ORAL | Status: DC | PRN

## 2018-07-18 MED ORDER — RIFAXIMIN 550 MG PO TABS
550.0000 mg | ORAL_TABLET | Freq: Two times a day (BID) | ORAL | Status: DC
  Administered 2018-07-19 – 2018-07-21 (×5): 550 mg via ORAL
  Filled 2018-07-18 (×5): qty 1

## 2018-07-18 MED ORDER — SODIUM CHLORIDE 0.9 % IV SOLN
2.0000 g | Freq: Once | INTRAVENOUS | Status: AC
  Administered 2018-07-18: 2 g via INTRAVENOUS
  Filled 2018-07-18: qty 2

## 2018-07-18 MED ORDER — PANTOPRAZOLE SODIUM 40 MG PO TBEC
40.0000 mg | DELAYED_RELEASE_TABLET | Freq: Every day | ORAL | Status: DC
  Administered 2018-07-19: 40 mg via ORAL
  Filled 2018-07-18: qty 1

## 2018-07-18 MED ORDER — LACTULOSE 10 GM/15ML PO SOLN
20.0000 g | Freq: Three times a day (TID) | ORAL | Status: DC
  Administered 2018-07-19 – 2018-07-21 (×7): 20 g via ORAL
  Filled 2018-07-18 (×7): qty 30

## 2018-07-18 MED ORDER — POTASSIUM CHLORIDE CRYS ER 20 MEQ PO TBCR
20.0000 meq | EXTENDED_RELEASE_TABLET | Freq: Every day | ORAL | Status: DC
  Administered 2018-07-19 – 2018-07-20 (×3): 20 meq via ORAL
  Filled 2018-07-18 (×3): qty 1

## 2018-07-18 MED ORDER — ONDANSETRON HCL 4 MG/2ML IJ SOLN: 4.0000 mg | Freq: Four times a day (QID) | INTRAMUSCULAR | Status: DC | PRN

## 2018-07-18 MED ORDER — METRONIDAZOLE IN NACL 5-0.79 MG/ML-% IV SOLN
500.0000 mg | Freq: Three times a day (TID) | INTRAVENOUS | Status: DC
  Administered 2018-07-18 – 2018-07-19 (×2): 500 mg via INTRAVENOUS
  Filled 2018-07-18 (×4): qty 100

## 2018-07-18 MED ORDER — SODIUM CHLORIDE 0.9 % IV SOLN
INTRAVENOUS | Status: DC
  Administered 2018-07-19: via INTRAVENOUS

## 2018-07-18 MED ORDER — POTASSIUM CHLORIDE CRYS ER 20 MEQ PO TBCR
40.0000 meq | EXTENDED_RELEASE_TABLET | Freq: Every day | ORAL | Status: DC
  Administered 2018-07-19 – 2018-07-21 (×3): 40 meq via ORAL
  Filled 2018-07-18 (×3): qty 2

## 2018-07-18 NOTE — Progress Notes (Signed)
CODE SEPSIS - PHARMACY COMMUNICATION  **Broad Spectrum Antibiotics should be administered within 1 hour of Sepsis diagnosis**  Time Code Sepsis Called/Page Received: 2004  Antibiotics Ordered: Cefepime/Vanc  Time of 1st antibiotic administration: 2016  Additional action taken by pharmacy:    If necessary, Name of Provider/Nurse Contacted:      Noralee Space ,PharmD Clinical Pharmacist  07/18/2018  8:43 PM

## 2018-07-18 NOTE — ED Notes (Signed)
Brittany RN, aware of bed assigned  

## 2018-07-18 NOTE — H&P (Signed)
[facility] NAME: Corey West    MR#:  [id number]  DATE OF BIRTH:  [birth year]  DATE OF ADMISSION:  07/18/2018  PRIMARY CARE PHYSICIAN: Baxter Hire, MD   REQUESTING/REFERRING PHYSICIAN: Jacqualine Code, MD  CHIEF COMPLAINT:   Chief Complaint  Patient presents with  . Code Sepsis    HISTORY OF PRESENT ILLNESS:  Corey West  is a 67 y.o. male who presents with chief complaint as above.  Patient presents with malaise and weakness.  Reportedly he had some intermittent confusion.  Here in the ED he meet sepsis criteria, febrile with leukocytosis.  He is found to have UTI.  He is also cirrhotic and on lactulose, and states that he has some irritation/breakdown in his sacral region due to the loose stools from the lactulose.  Hospitalist were called for admission  PAST MEDICAL HISTORY:   Past Medical History:  Diagnosis Date  . Anemia   . Anxiety    controlled;   . Arthritis   . AVM (arteriovenous malformation) of stomach, acquired with hemorrhage   . Barrett's esophagus   . Chronic kidney disease    renal infufficiency  . Cirrhosis ([place])   . Depression    controlled;   Marland Kitchen Diabetes mellitus without complication ([place])    not controlled, taking insulin but sugar continues to run high;   . Edema   . Esophageal varices ([place])   . GERD (gastroesophageal reflux disease)   . History of hiatal hernia   . Hyperlipidemia   . Hypertension    controlled well;   . 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 Obesity   . Pancytopenia ([place])   . Polyp, stomach    with chronic blood loss  . Sleep apnea    does not wear a cpap, Medicare would not pay for it   . Venous stasis dermatitis of both lower extremities   . Venous stasis of both lower extremities    with cellulitis     PAST SURGICAL HISTORY:   Past Surgical History:  Procedure Laterality Date  . ESOPHAGOGASTRODUODENOSCOPY N/A 02/09/2015   Procedure: ESOPHAGOGASTRODUODENOSCOPY (EGD);   Surgeon: Manya Silvas, MD;  Location: [facility] ENDOSCOPY;  Service: Endoscopy;  Laterality: N/A;  . ESOPHAGOGASTRODUODENOSCOPY N/A 07/22/2015   Procedure: ESOPHAGOGASTRODUODENOSCOPY (EGD);  Surgeon: Manya Silvas, MD;  Location: [facility] ENDOSCOPY;  Service: Endoscopy;  Laterality: N/A;  . ESOPHAGOGASTRODUODENOSCOPY N/A 06/26/2017   Procedure: ESOPHAGOGASTRODUODENOSCOPY (EGD);  Surgeon: Manya Silvas, MD;  Location: [facility] ENDOSCOPY;  Service: Endoscopy;  Laterality: N/A;  . ESOPHAGOGASTRODUODENOSCOPY N/A 06/27/2017   Procedure: ESOPHAGOGASTRODUODENOSCOPY (EGD);  Surgeon: Manya Silvas, MD;  Location: [facility] ENDOSCOPY;  Service: Endoscopy;  Laterality: N/A;  . ESOPHAGOGASTRODUODENOSCOPY N/A 06/28/2017   Procedure: ESOPHAGOGASTRODUODENOSCOPY (EGD);  Surgeon: Manya Silvas, MD;  Location: [facility] ENDOSCOPY;  Service: Endoscopy;  Laterality: N/A;  . ESOPHAGOGASTRODUODENOSCOPY Bilateral 10/11/2017   Procedure: ESOPHAGOGASTRODUODENOSCOPY (EGD);  Surgeon: Manya Silvas, MD;  Location: [facility] ENDOSCOPY;  Service: Endoscopy;  Laterality: Bilateral;  . ESOPHAGOGASTRODUODENOSCOPY N/A 12/04/2017   Procedure: ESOPHAGOGASTRODUODENOSCOPY (EGD);  Surgeon: Manya Silvas, MD;  Location[facility] ENDOSCOPY;  Service: Endoscopy;  Laterality: N/A;  . ESOPHAGOGASTRODUODENOSCOPY (EGD) WITH PROPOFOL N/A 07/20/2015   Procedure: ESOPHAGOGASTRODUODENOSCOPY (EGD) WITH PROPOFOL;  Surgeon: Manya Silvas, MD;  Location: [facility] ENDOSCOPY;  Service: Endoscopy;  Laterality: N/A;  . ESOPHAGOGASTRODUODENOSCOPY (EGD) WITH PROPOFOL N/A 09/16/2015   Procedure: ESOPHAGOGASTRODUODENOSCOPY (EGD) WITH PROPOFOL;  Surgeon: Manya Silvas, MD;  Location: [place];  Service: Endoscopy;  Laterality: N/A;  . ESOPHAGOGASTRODUODENOSCOPY (EGD) WITH PROPOFOL N/A 03/16/2016   Procedure: ESOPHAGOGASTRODUODENOSCOPY (EGD) WITH PROPOFOL;  Surgeon: Manya Silvas, MD;  Location: [facility] ENDOSCOPY;  Service: Endoscopy;  Laterality: N/A;  .  ESOPHAGOGASTRODUODENOSCOPY (EGD) WITH PROPOFOL N/A 09/14/2016   Procedure: ESOPHAGOGASTRODUODENOSCOPY (EGD) WITH PROPOFOL;  Surgeon: Manya Silvas, MD;  Location: [facility] ENDOSCOPY;  Service: Endoscopy;  Laterality: N/A;  . ESOPHAGOGASTRODUODENOSCOPY (EGD) WITH PROPOFOL N/A 11/05/2016   Procedure: ESOPHAGOGASTRODUODENOSCOPY (EGD) WITH PROPOFOL;  Surgeon: Manya Silvas, MD;  Location: [facility] ENDOSCOPY;  Service: Endoscopy;  Laterality: N/A;  . ESOPHAGOGASTRODUODENOSCOPY (EGD) WITH PROPOFOL N/A 02/06/2017   Procedure: ESOPHAGOGASTRODUODENOSCOPY (EGD) WITH PROPOFOL;  Surgeon: Manya Silvas, MD;  Location: [facility] ENDOSCOPY;  Service: Endoscopy;  Laterality: N/A;  . ESOPHAGOGASTRODUODENOSCOPY (EGD) WITH PROPOFOL N/A 04/24/2017   Procedure: ESOPHAGOGASTRODUODENOSCOPY (EGD) WITH PROPOFOL;  Surgeon: Manya Silvas, MD;  Location: [facility] ENDOSCOPY;  Service: Endoscopy;  Laterality: N/A;  . ESOPHAGOGASTRODUODENOSCOPY (EGD) WITH PROPOFOL N/A 07/31/2017   Procedure: ESOPHAGOGASTRODUODENOSCOPY (EGD) WITH PROPOFOL;  Surgeon: Manya Silvas, MD;  Location: [facility] ENDOSCOPY;  Service: Endoscopy;  Laterality: N/A;  . GIVENS CAPSULE STUDY N/A 12/05/2017   Procedure: GIVENS CAPSULE STUDY;  Surgeon: Manya Silvas, MD;  Location: [facility] ENDOSCOPY;  Service: Endoscopy;  Laterality: N/A;  . TONSILLECTOMY    . TONSILLECTOMY AND ADENOIDECTOMY    . ULNAR NERVE TRANSPOSITION    . UVULOPALATOPHARYNGOPLASTY       SOCIAL HISTORY:   Social History   Tobacco Use  . Smoking status: Current Some Day Smoker    Types: Cigars  . Smokeless tobacco: Never Used  . Tobacco comment: smoke 1 at night occasionally;   Substance Use Topics  . Alcohol use: No    Comment: stopped 12 years ago     FAMILY HISTORY:   Family History  Problem Relation Age of Onset  . Diabetes Other   . Transient ischemic attack Father   . CAD Father      DRUG ALLERGIES:  No Known Allergies  MEDICATIONS AT HOME:   Prior to Admission  medications   Medication Sig Start Date End Date Taking? Authorizing Provider  docusate sodium (COLACE) 100 MG capsule Take 100 mg by mouth 2 (two) times daily.    [provider]  insulin aspart protamine- aspart (NOVOLOG MIX 70/30) (70-30) 100 UNIT/ML injection Inject into the skin.    [provider]  lactulose (CEPHULAC) 20 g packet Take 20 g by mouth 3 (three) times daily.    [provider]  LORazepam (ATIVAN) 0.5 MG tablet Take 0.5 mg by mouth every 8 (eight) hours.    [provider]  magnesium oxide (MAG-OX) 400 MG tablet Take 400 mg by mouth daily.    [provider]  Multiple Vitamin (MULTIVITAMIN) tablet Take 1 tablet by mouth daily.    [provider]  omeprazole (PRILOSEC) 40 MG capsule Take 40 mg by mouth daily.    [provider]  Potassium Chloride ER 20 MEQ TBCR Take by mouth.    [provider]  ranitidine (ZANTAC) 150 MG capsule Take 150 mg by mouth 2 (two) times daily.    [provider]  rifaximin (XIFAXAN) 550 MG TABS tablet Take 1 tablet (550 mg total) by mouth 2 (two) times daily. 01/01/18   Loletha Grayer, MD  sertraline (ZOLOFT) 25 MG tablet Take 25 mg by mouth daily.    [provider]  traZODone (DESYREL) 50 MG tablet Take 25 mg by  mouth at bedtime.    [provider]    REVIEW OF SYSTEMS:  ROS   VITAL SIGNS:   Vitals:   07/18/18 1949 07/18/18 1954  BP: (!) 156/61   Pulse: 89   Resp: (!) 24   Temp: (!) 102.5 F (39.2 C)   TempSrc: Oral   SpO2: 97%   Weight:  131 kg  Height:  5\' 9"  (1.753 m)   Wt Readings from Last 3 Encounters:  07/18/18 131 kg  06/24/18 128.6 kg  06/19/18 123.2 kg    PHYSICAL EXAMINATION:  Physical Exam  Vitals reviewed. Constitutional: He is oriented to person, place, and time. He appears well-developed and well-nourished. No distress.  HENT:  Head: Normocephalic and atraumatic.  Mouth/Throat: Oropharynx is clear and moist.   Eyes: Pupils are equal, round, and reactive to light. Conjunctivae and EOM are normal. No scleral icterus.  Neck: Normal range of motion. Neck supple. No JVD present. No thyromegaly present.  Cardiovascular: Normal rate, regular rhythm and intact distal pulses. Exam reveals no gallop and no friction rub.  No murmur heard. Respiratory: Effort normal and breath sounds normal. No respiratory distress. He has no wheezes. He has no rales.  GI: Soft. Bowel sounds are normal. He exhibits no distension. There is no tenderness.  Musculoskeletal: Normal range of motion. He exhibits no edema.  No arthritis, no gout  Lymphadenopathy:    He has no cervical adenopathy.  Neurological: He is alert and oriented to person, place, and time. No cranial nerve deficit.  No dysarthria, no aphasia  Skin: Skin is warm and dry. No rash noted. There is erythema (Bilateral lower extremities, venous stasis in appearance).  Psychiatric: He has a normal mood and affect. His behavior is normal. Judgment and thought content normal.    LABORATORY PANEL:   CBC No results for input(s): WBC, HGB, HCT, PLT in the last 168 hours. ------------------------------------------------------------------------------------------------------------------  Chemistries  Recent Labs  Lab 07/18/18 1958  NA 135  K 3.0*  CL 97*  CO2 28  GLUCOSE 336*  BUN 29*  CREATININE 2.23*  CALCIUM 9.7  AST 34  ALT 26  ALKPHOS 73  BILITOT 1.0   ------------------------------------------------------------------------------------------------------------------  Cardiac Enzymes No results for input(s): TROPONINI in the last 168 hours. ------------------------------------------------------------------------------------------------------------------  RADIOLOGY:  Dg Chest 2 View  Result Date: 07/18/2018 CLINICAL DATA:  Fever to 102. EXAM: CHEST - 2 VIEW COMPARISON:  06/18/2018 FINDINGS: The heart size and mediastinal contours are within  normal limits. Stable mild elevation of the left hemidiaphragm. Coarsened interstitial lung markings without alveolar consolidation, effusion or pneumothorax. The visualized skeletal structures are unremarkable. IMPRESSION: Mild interstitial coarsening and thickening since prior. Stigmata of small airway inflammation/bronchitic change is a possibility. No alveolar consolidation to suggest bacterial pneumonia. Electronically Signed   By: Ashley Royalty M.D.   On: 07/18/2018 20:43    EKG:   Orders placed or performed during the hospital encounter of 07/18/18  . EKG 12-Lead  . EKG 12-Lead  . ED EKG 12-Lead  . ED EKG 12-Lead    IMPRESSION AND PLAN:  Principal Problem:   Sepsis ([place]) -broad-spectrum IV antibiotics started, lactic acid is mildly elevated, we will initiate IV fluids and monitor lactate until within normal limits.  Blood pressure stable, urine and blood cultures sent Active Problems:   UTI (urinary tract infection) -IV antibiotics, urine culture as above   Cirrhosis ([place]) -avoid hepatotoxins, continue home antihyperammonemics   Type 2 diabetes mellitus (HCC) -sliding scale insulin with corresponding glucose  checks   Gastroesophageal reflux disease -Home dose PPI   Hyperlipidemia -Home dose antilipid  Chart review performed and case discussed with ED provider. Labs, imaging and/or ECG reviewed by provider and discussed with patient/family. Management plans discussed with the patient and/or family.  DVT PROPHYLAXIS: Mechanical only due to chronic thrombocytopenia  GI PROPHYLAXIS:  PPI   ADMISSION STATUS: Inpatient     CODE STATUS: Full Code Status History    Date Active Date Inactive Code Status Order ID Comments User Context   06/18/2018 1609 06/19/2018 1717 Full Code [id number]  Epifanio Lesches, MD ED   12/31/2017 1856 01/01/2018 1205 Full Code [id number]  Saundra Shelling, MD Inpatient   12/25/2017 1456 12/26/2017 1618 Full Code [id number]  Epifanio Lesches, MD ED   12/16/2017  0921 12/18/2017 1635 DNR [id number]  Max Sane, MD Inpatient   12/16/2017 0305 12/16/2017 0921 Full Code [id number]  Salary, Avel Peace, MD ED   12/02/2017 2003 12/05/2017 2000 Full Code [id number]  Saundra Shelling, MD Inpatient   11/21/2017 1948 11/23/2017 1958 Full Code [id number]  Loletha Grayer, MD ED   10/23/2017 1405 10/26/2017 1402 Full Code [id number]  Bettey Costa, MD Inpatient   10/18/2017 0058 10/19/2017 1806 Full Code [id number]  Saundra Shelling, MD Inpatient   10/09/2017 2139 10/11/2017 1806 Full Code [id number]  [name], MD Inpatient   09/03/2017 2124 09/05/2017 1552 Full Code [id number]  Nicholes Mango, MD Inpatient   06/26/2017 1119 06/28/2017 1748 Full Code [id number]  Henreitta Leber, MD Inpatient   02/21/2017 2007 02/22/2017 1803 Full Code [id number]  [name], MD Inpatient   07/20/2015 1245 07/22/2015 2217 Full Code [id number]  Nicholes Mango, MD Inpatient   02/08/2015 2331 02/11/2015 1943 Full Code [id number]  Hower, Aaron Mose, MD ED      TOTAL TIME TAKING CARE OF THIS PATIENT: 45 minutes.   Alexandra Posadas [place] 07/18/2018, 9:41 PM  [facility]  Office  [phone]  CC: Primary care physician; Baxter Hire, MD  Note:  This document was prepared using Dragon voice recognition software and may include unintentional dictation errors.

## 2018-07-18 NOTE — ED Notes (Signed)
Pt was given 650 mg of Tylenol via EMS

## 2018-07-18 NOTE — ED Notes (Signed)
Pt going to medical imaging.   

## 2018-07-18 NOTE — ED Notes (Signed)
Pt is back from medical imaging. 

## 2018-07-18 NOTE — ED Triage Notes (Signed)
EMS stated that pt had a fever 102.3 and he had not been feeling goof for the past few days. Pt also stated that he had fallen today. Pt has some redness to his lower legs, mainly on left leg

## 2018-07-18 NOTE — ED Notes (Signed)
Report was called and given

## 2018-07-18 NOTE — ED Provider Notes (Signed)
[facility] Emergency Department Provider Note   ____________________________________________   First MD Initiated Contact with Patient 07/18/18 1952     (approximate)  I have reviewed the triage vital signs and the nursing notes.   HISTORY  Chief Complaint Code Sepsis    HPI Corey West is a 67 y.o. male alerted as a sepsis alert by EMS, patient noted to have variable end-tidal's but some less than 25 with a high fever.  He became fatigued over the last couple of days, today he had a fall he reports he did not fall and strike his head but rather just kind of stumbled and hit his left elbow, but no head injury.  On EMS arrival they noticed him to be fatigue check a temperature is 102.  Given 650 mg Tylenol, started IV fluid bolus and called a sepsis alert by EMS.  Patient also reports he had a recent esophageal procedure at [facility], followed up with his primary care doctor and was doing well just a few days ago.  He has not noticed a fever himself but is no significant and very fatigued tired.  No shortness of breath no chest pain.  Denies any pain anywhere.  No black or bloody stools.  No drug allergies, does report he has problems with his legs and has had previous infection there   Past Medical History:  Diagnosis Date  . Anemia   . Anxiety    controlled;   . Arthritis   . AVM (arteriovenous malformation) of stomach, acquired with hemorrhage   . Barrett's esophagus   . Chronic kidney disease    renal infufficiency  . Cirrhosis ([place])   . Depression    controlled;   Marland Kitchen Diabetes mellitus without complication ([place])    not controlled, taking insulin but sugar continues to run high;   . Edema   . Esophageal varices ([place])   . GERD (gastroesophageal reflux disease)   . History of hiatal hernia   . Hyperlipidemia   . Hypertension    controlled well;   . 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 Obesity   . Pancytopenia ([place])   . Polyp, stomach    with chronic  blood loss  . Sleep apnea    does not wear a cpap, Medicare would not pay for it   . Venous stasis dermatitis of both lower extremities   . Venous stasis of both lower extremities    with cellulitis    Patient Active Problem List   Diagnosis Date Noted  . UTI (urinary tract infection) 07/18/2018  . Goals of care, counseling/discussion   . Palliative care encounter   . Hepatic encephalopathy syndrome ([place]) 12/16/2017  . Hepatic encephalopathy ([place]) 10/18/2017  . Hypokalemia 10/09/2017  . Hyperglycemia 10/09/2017  . Symptomatic anemia 06/26/2017  . Sepsis ([place]) 06/12/2017  . Cellulitis in diabetic foot ([place]) 02/21/2017  . Uncontrolled diabetes mellitus ([place]) 02/21/2017  . Cellulitis 02/21/2017  . GI bleed 07/20/2015  . Hematemesis 02/08/2015  . Cirrhosis ([place]) 02/08/2015  . Esophageal varices ([place]) 02/08/2015  . Type 2 diabetes mellitus ([place]) 02/08/2015  . Gastroesophageal reflux disease 02/08/2015  . Hyperlipidemia 02/08/2015  . Iron deficiency anemia 12/04/2013  . Thrombocytopenia ([place]) 12/04/2013  . Splenomegaly 11/04/2013    Past Surgical History:  Procedure Laterality Date  . ESOPHAGOGASTRODUODENOSCOPY N/A 02/09/2015   Procedure: ESOPHAGOGASTRODUODENOSCOPY (EGD);  Surgeon: Manya Silvas, MD;  Location: [facility] ENDOSCOPY;  Service: Endoscopy;  Laterality: N/A;  . ESOPHAGOGASTRODUODENOSCOPY N/A 07/22/2015  Procedure: ESOPHAGOGASTRODUODENOSCOPY (EGD);  Surgeon: Manya Silvas, MD;  Location: [facility] ENDOSCOPY;  Service: Endoscopy;  Laterality: N/A;  . ESOPHAGOGASTRODUODENOSCOPY N/A 06/26/2017   Procedure: ESOPHAGOGASTRODUODENOSCOPY (EGD);  Surgeon: Manya Silvas, MD;  Location: [facility] ENDOSCOPY;  Service: Endoscopy;  Laterality: N/A;  . ESOPHAGOGASTRODUODENOSCOPY N/A 06/27/2017   Procedure: ESOPHAGOGASTRODUODENOSCOPY (EGD);  Surgeon: Manya Silvas, MD;  Location: [facility] ENDOSCOPY;  Service: Endoscopy;  Laterality: N/A;  . ESOPHAGOGASTRODUODENOSCOPY N/A 06/28/2017    Procedure: ESOPHAGOGASTRODUODENOSCOPY (EGD);  Surgeon: Manya Silvas, MD;  Location: [facility] ENDOSCOPY;  Service: Endoscopy;  Laterality: N/A;  . ESOPHAGOGASTRODUODENOSCOPY Bilateral 10/11/2017   Procedure: ESOPHAGOGASTRODUODENOSCOPY (EGD);  Surgeon: Manya Silvas, MD;  Location: [facility] ENDOSCOPY;  Service: Endoscopy;  Laterality: Bilateral;  . ESOPHAGOGASTRODUODENOSCOPY N/A 12/04/2017   Procedure: ESOPHAGOGASTRODUODENOSCOPY (EGD);  Surgeon: Manya Silvas, MD;  Location: [facility] ENDOSCOPY;  Service: Endoscopy;  Laterality: N/A;  . ESOPHAGOGASTRODUODENOSCOPY (EGD) WITH PROPOFOL N/A 07/20/2015   Procedure: ESOPHAGOGASTRODUODENOSCOPY (EGD) WITH PROPOFOL;  Surgeon: Manya Silvas, MD;  Location: [facility] ENDOSCOPY;  Service: Endoscopy;  Laterality: N/A;  . ESOPHAGOGASTRODUODENOSCOPY (EGD) WITH PROPOFOL N/A 09/16/2015   Procedure: ESOPHAGOGASTRODUODENOSCOPY (EGD) WITH PROPOFOL;  Surgeon: Manya Silvas, MD;  Location: [facility] ENDOSCOPY;  Service: Endoscopy;  Laterality: N/A;  . ESOPHAGOGASTRODUODENOSCOPY (EGD) WITH PROPOFOL N/A 03/16/2016   Procedure: ESOPHAGOGASTRODUODENOSCOPY (EGD) WITH PROPOFOL;  Surgeon: Manya Silvas, MD;  Location: [facility] ENDOSCOPY;  Service: Endoscopy;  Laterality: N/A;  . ESOPHAGOGASTRODUODENOSCOPY (EGD) WITH PROPOFOL N/A 09/14/2016   Procedure: ESOPHAGOGASTRODUODENOSCOPY (EGD) WITH PROPOFOL;  Surgeon: Manya Silvas, MD;  Location: [facility] ENDOSCOPY;  Service: Endoscopy;  Laterality: N/A;  . ESOPHAGOGASTRODUODENOSCOPY (EGD) WITH PROPOFOL N/A 11/05/2016   Procedure: ESOPHAGOGASTRODUODENOSCOPY (EGD) WITH PROPOFOL;  Surgeon: Manya Silvas, MD;  Location: [facility] ENDOSCOPY;  Service: Endoscopy;  Laterality: N/A;  . ESOPHAGOGASTRODUODENOSCOPY (EGD) WITH PROPOFOL N/A 02/06/2017   Procedure: ESOPHAGOGASTRODUODENOSCOPY (EGD) WITH PROPOFOL;  Surgeon: Manya Silvas, MD;  Location: [facility] ENDOSCOPY;  Service: Endoscopy;  Laterality: N/A;  . ESOPHAGOGASTRODUODENOSCOPY (EGD) WITH PROPOFOL N/A  04/24/2017   Procedure: ESOPHAGOGASTRODUODENOSCOPY (EGD) WITH PROPOFOL;  Surgeon: Manya Silvas, MD;  Location: [facility] ENDOSCOPY;  Service: Endoscopy;  Laterality: N/A;  . ESOPHAGOGASTRODUODENOSCOPY (EGD) WITH PROPOFOL N/A 07/31/2017   Procedure: ESOPHAGOGASTRODUODENOSCOPY (EGD) WITH PROPOFOL;  Surgeon: Manya Silvas, MD;  Location: [facility] ENDOSCOPY;  Service: Endoscopy;  Laterality: N/A;  . GIVENS CAPSULE STUDY N/A 12/05/2017   Procedure: GIVENS CAPSULE STUDY;  Surgeon: Manya Silvas, MD;  Location: [facility] ENDOSCOPY;  Service: Endoscopy;  Laterality: N/A;  . TONSILLECTOMY    . TONSILLECTOMY AND ADENOIDECTOMY    . ULNAR NERVE TRANSPOSITION    . UVULOPALATOPHARYNGOPLASTY      Prior to Admission medications   Medication Sig Start Date End Date Taking? Authorizing Provider  docusate sodium (COLACE) 100 MG capsule Take 100 mg by mouth 2 (two) times daily.   Yes [provider]  furosemide (LASIX) 40 MG tablet Take 40 mg by mouth daily.   Yes [provider]  insulin aspart protamine - aspart (NOVOLOG 70/30 MIX) (70-30) 100 UNIT/ML FlexPen Inject 84 Units into the skin daily with breakfast.    Yes [provider]  insulin aspart protamine - aspart (NOVOLOG 70/30 MIX) (70-30) 100 UNIT/ML FlexPen Inject 65 Units into the skin at bedtime.   Yes [provider]  lactulose (CHRONULAC) 10 GM/15ML solution Take 30 mLs by mouth 2 (two) times daily.  07/04/18  Yes [provider]  LORazepam (ATIVAN) 0.5 MG tablet Take 0.5 mg by mouth every  8 (eight) hours as needed for anxiety.    Yes [provider]  metolazone (ZAROXOLYN) 2.5 MG tablet Take 2.5 mg by mouth daily.   Yes [provider]  Multiple Vitamin (MULTIVITAMIN) tablet Take 1 tablet by mouth daily.   Yes [provider]  omeprazole (PRILOSEC) 40 MG capsule Take 40 mg by mouth daily.   Yes [provider]  Potassium Chloride ER 20 MEQ TBCR Take 40 mEq by mouth daily.     Yes [provider]  potassium chloride SA (K-DUR,KLOR-CON) 20 MEQ tablet Take 20 mEq by mouth at bedtime.   Yes [provider]  ranitidine (ZANTAC) 150 MG capsule Take 150 mg by mouth 2 (two) times daily as needed for heartburn.    Yes [provider]  rifaximin (XIFAXAN) 550 MG TABS tablet Take 1 tablet (550 mg total) by mouth 2 (two) times daily. 01/01/18  Yes Loletha Grayer, MD    Allergies Patient has no known allergies.  Family History  Problem Relation Age of Onset  . Diabetes Other   . Transient ischemic attack Father   . CAD Father     Social History Social History   Tobacco Use  . Smoking status: Current Some Day Smoker    Types: Cigars  . Smokeless tobacco: Never Used  . Tobacco comment: smoke 1 at night occasionally;   Substance Use Topics  . Alcohol use: No    Comment: stopped 12 years ago  . Drug use: No    Review of Systems Constitutional: No fever/chills but feels very fatigued last couple of days Eyes: No visual changes. ENT: No sore throat. Cardiovascular: Denies chest pain. Respiratory: Denies shortness of breath. Gastrointestinal: No abdominal pain.   Genitourinary: Negative for dysuria. Musculoskeletal: Negative for back pain. Skin: Negative for rash over his lower legs bilateral. Neurological: Negative for headaches, areas of focal weakness or numbness.    ____________________________________________   PHYSICAL EXAM:  VITAL SIGNS: ED Triage Vitals  Enc Vitals Group     BP 07/18/18 1949 (!) 156/61     Pulse Rate 07/18/18 1949 89     Resp 07/18/18 1949 (!) 24     Temp 07/18/18 1949 (!) 102.5 F (39.2 C)     Temp Source 07/18/18 1949 Oral     SpO2 07/18/18 1949 97 %     Weight 07/18/18 1954 288 lb 12.8 oz (131 kg)     Height 07/18/18 1954 5\' 9"  (1.753 m)     Head Circumference --      Peak Flow --      Pain Score 07/18/18 1955 0     Pain Loc --      Pain Edu? --      Excl. in [place]? --     Constitutional:  Alert and oriented.  Moderately ill-appearing, no acute distress but appears fatigued and generally ill. Eyes: Conjunctivae are normal. Head: Atraumatic. Nose: No congestion/rhinnorhea. Mouth/Throat: Mucous membranes are slightly dry Neck: No stridor.  Cardiovascular: Normal rate, regular rhythm. Grossly normal heart sounds.  Good peripheral circulation. Respiratory: Normal respiratory effort.  No retractions. Lungs CTAB. Gastrointestinal: Soft and nontender. No distention. Musculoskeletal: No lower extremity tenderness nor edema. Neurologic:  Normal speech and language. No gross focal neurologic deficits are appreciated.  Skin:  Skin is warm, dry and intact. No rash noted. Psychiatric: Mood and affect are normal. Speech and behavior are normal.  ____________________________________________   LABS (all labs ordered are listed, but only abnormal results are displayed)  Labs Reviewed  LACTIC ACID, PLASMA - Abnormal; Notable for the following components:      Result Value   Lactic Acid, Venous 2.3 (*)    All other components within normal limits  COMPREHENSIVE METABOLIC PANEL - Abnormal; Notable for the following components:   Potassium 3.0 (*)    Chloride 97 (*)    Glucose, Bld 336 (*)    BUN 29 (*)    Creatinine, Ser 2.23 (*)    Albumin 3.2 (*)    GFR calc non Af Amer 29 (*)    GFR calc Af Amer 34 (*)    All other components within normal limits  CBC WITH DIFFERENTIAL/PLATELET - Abnormal; Notable for the following components:   WBC 12.1 (*)    RBC 3.38 (*)    Hemoglobin 11.2 (*)    HCT 29.9 (*)    MCHC 37.5 (*)    Platelets 83 (*)    Neutro Abs 12.1 (*)    Lymphs Abs 0.5 (*)    Abs Immature Granulocytes 0.14 (*)    All other components within normal limits  URINALYSIS, ROUTINE W REFLEX MICROSCOPIC - Abnormal; Notable for the following components:   Color, Urine YELLOW (*)    APPearance HAZY (*)    Glucose, UA >=500 (*)    Hgb urine dipstick SMALL (*)    Protein, ur  100 (*)    Leukocytes, UA LARGE (*)    RBC / HPF >50 (*)    Bacteria, UA MANY (*)    All other components within normal limits  BLOOD GAS, VENOUS - Abnormal; Notable for the following components:   pH, Ven 7.51 (*)    pCO2, Ven 38 (*)    pO2, Ven 68.0 (*)    Bicarbonate 30.3 (*)    Acid-Base Excess 6.9 (*)    All other components within normal limits  CULTURE, BLOOD (ROUTINE X 2)  CULTURE, BLOOD (ROUTINE X 2)  LIPASE, BLOOD  PROTIME-INR  LACTIC ACID, PLASMA  BASIC METABOLIC PANEL  CBC   ____________________________________________  EKG  Reviewed and entered by me at 1958 Heart rate 90 QRS 110 QTc 470 Normal sinus rhythm, 1 degree avblock, no evidence of acute ischemia. ____________________________________________  RADIOLOGY  Dg Chest 2 View  Result Date: 07/18/2018 CLINICAL DATA:  Fever to 102. EXAM: CHEST - 2 VIEW COMPARISON:  06/18/2018 FINDINGS: The heart size and mediastinal contours are within normal limits. Stable mild elevation of the left hemidiaphragm. Coarsened interstitial lung markings without alveolar consolidation, effusion or pneumothorax. The visualized skeletal structures are unremarkable. IMPRESSION: Mild interstitial coarsening and thickening since prior. Stigmata of small airway inflammation/bronchitic change is a possibility. No alveolar consolidation to suggest bacterial pneumonia. Electronically Signed   By: Ashley Royalty M.D.   On: 07/18/2018 20:43     Chest x-ray reviewed, possible inflammatory bronchitic change noted. ____________________________________________   PROCEDURES  Procedure(s) performed: None  Procedures  Critical Care performed: Yes, see critical care note(s)  CRITICAL CARE Performed by: Delman Kitten   Total critical care time: 38 minutes  Critical care time was exclusive of separately billable procedures and treating other patients.  Critical care was necessary to treat or prevent imminent or life-threatening  deterioration.  Critical care was time spent personally by me on the following activities: development of treatment plan with patient and/or surrogate as well as nursing, discussions with consultants, evaluation of patient's response to treatment, examination of patient, obtaining history from patient or surrogate, ordering and performing treatments and interventions,  ordering and review of laboratory studies, ordering and review of radiographic studies, pulse oximetry and re-evaluation of patient's condition.  ____________________________________________   INITIAL IMPRESSION / ASSESSMENT AND PLAN / ED COURSE  Pertinent labs & imaging results that were available during my care of the patient were reviewed by me and considered in my medical decision making (see chart for details).   Patient was for evaluation of fatigue, noted to be febrile, with some redness on the left lower extremity and his venous stasis.  Additionally urinalysis demonstrates concern for possible UTI.  He denies pulmonary symptoms.  No neurologic cardiac or pulmonary symptoms.  Alert and oriented with reassuring hemodynamics but slightly elevated lactic acid qualifying as severe sepsis.  Code sepsis paged.  Patient did not demonstrate hypotension or lactic acid elevated greater than 4, thus giving his reassuring hemodynamics we initiate IV antibiotics without large volume IV fluid resuscitation.    Sepsis reassessment completed, patient remains alert and oriented.  Fever has improved, reports feeling improvement.  Resting comfortably awaiting admission  ____________________________________________   FINAL CLINICAL IMPRESSION(S) / ED DIAGNOSES  Final diagnoses:  Severe sepsis ([place])  Acute cystitis without hematuria  Cellulitis of left lower extremity        Note:  This document was prepared using Dragon voice recognition software and may include unintentional dictation errors       Delman Kitten, MD 07/18/18  2352

## 2018-07-18 NOTE — Progress Notes (Signed)
Pharmacy Antibiotic Note  Corey West is a 67 y.o. male admitted on 07/18/2018 with sepsis.  Pharmacy has been consulted for Vancomycin and Cefepime dosing.  Unknown source  Plan:Patient received Vancomycin 1 gram x 1 and Cefepime 2 gram IV x 1 in ED. Also has Metronidazole ordered. Will continue with Vancomycin stacked dosing Vancomycin 1250 IV every 24 hours.  Goal trough 15-20 mcg/mL.  Ke 0.031  t 1/2 22.36  Vd 66.4  Adj Wt 94.8 kg Will order Vanc trough prior to 5th dose on 10/22.  -Cefepime 2 gram IV q24h   Height: 5\' 9"  (175.3 cm) Weight: 288 lb 12.8 oz (131 kg) IBW/kg (Calculated) : 70.7  Temp (24hrs), Avg:102.5 F (39.2 C), Min:102.5 F (39.2 C), Max:102.5 F (39.2 C)  Recent Labs  Lab 07/18/18 1958  CREATININE 2.23*  LATICACIDVEN 2.3*    Estimated Creatinine Clearance: 43.7 mL/min (A) (by C-G formula based on SCr of 2.23 mg/dL (H)).    No Known Allergies  Antimicrobials this admission: Cefepime 10/18 >>   Vanc 10/18 >> Metro 10/18 >>    Dose adjustments this admission:    Microbiology results: 10/18 BCx: P   UCx:      Sputum:      MRSA PCR:    Thank you for allowing pharmacy to be a part of this patient's care.  Ireoluwa Gorsline A 07/18/2018 9:12 PM

## 2018-07-19 ENCOUNTER — Other Ambulatory Visit: Payer: Self-pay

## 2018-07-19 ENCOUNTER — Encounter: Payer: Self-pay | Admitting: *Deleted

## 2018-07-19 DIAGNOSIS — L8990 Pressure ulcer of unspecified site, unspecified stage: Secondary | ICD-10-CM

## 2018-07-19 LAB — BASIC METABOLIC PANEL
ANION GAP: 11 (ref 5–15)
BUN: 33 mg/dL — ABNORMAL HIGH (ref 8–23)
CALCIUM: 9 mg/dL (ref 8.9–10.3)
CHLORIDE: 98 mmol/L (ref 98–111)
CO2: 27 mmol/L (ref 22–32)
Creatinine, Ser: 2.32 mg/dL — ABNORMAL HIGH (ref 0.61–1.24)
GFR, EST AFRICAN AMERICAN: 32 mL/min — AB (ref >60)
GFR, EST NON AFRICAN AMERICAN: 28 mL/min — AB (ref >60)
Glucose, Bld: 336 mg/dL — ABNORMAL HIGH (ref 70–99)
Potassium: 3 mmol/L — ABNORMAL LOW (ref 3.5–5.1)
Sodium: 136 mmol/L (ref 135–145)

## 2018-07-19 LAB — BLOOD CULTURE ID PANEL (REFLEXED)
Acinetobacter baumannii: NOT DETECTED
CANDIDA GLABRATA: NOT DETECTED
CANDIDA TROPICALIS: NOT DETECTED
Candida albicans: NOT DETECTED
Candida krusei: NOT DETECTED
Candida parapsilosis: NOT DETECTED
ENTEROCOCCUS SPECIES: NOT DETECTED
Enterobacter cloacae complex: NOT DETECTED
Enterobacteriaceae species: NOT DETECTED
Escherichia coli: NOT DETECTED
HAEMOPHILUS INFLUENZAE: NOT DETECTED
KLEBSIELLA PNEUMONIAE: NOT DETECTED
Klebsiella oxytoca: NOT DETECTED
Listeria monocytogenes: NOT DETECTED
METHICILLIN RESISTANCE: DETECTED — AB
NEISSERIA MENINGITIDIS: NOT DETECTED
PROTEUS SPECIES: NOT DETECTED
Pseudomonas aeruginosa: NOT DETECTED
SERRATIA MARCESCENS: NOT DETECTED
STAPHYLOCOCCUS AUREUS BCID: NOT DETECTED
STAPHYLOCOCCUS SPECIES: DETECTED — AB
STREPTOCOCCUS SPECIES: NOT DETECTED
Streptococcus agalactiae: NOT DETECTED
Streptococcus pneumoniae: NOT DETECTED
Streptococcus pyogenes: NOT DETECTED

## 2018-07-19 LAB — CBC
HCT: 24 % — ABNORMAL LOW (ref 39.0–52.0)
Hemoglobin: 8.7 g/dL — ABNORMAL LOW (ref 13.0–17.0)
MCH: 32.3 pg (ref 26.0–34.0)
MCHC: 36.3 g/dL — AB (ref 30.0–36.0)
MCV: 89.2 fL (ref 80.0–100.0)
NRBC: 0 % (ref 0.0–0.2)
PLATELETS: 70 10*3/uL — AB (ref 150–400)
RBC: 2.69 MIL/uL — ABNORMAL LOW (ref 4.22–5.81)
RDW: 13.5 % (ref 11.5–15.5)
WBC: 10 10*3/uL (ref 4.0–10.5)

## 2018-07-19 LAB — GLUCOSE, CAPILLARY
GLUCOSE-CAPILLARY: 405 mg/dL — AB (ref 70–99)
Glucose-Capillary: 299 mg/dL — ABNORMAL HIGH (ref 70–99)
Glucose-Capillary: 360 mg/dL — ABNORMAL HIGH (ref 70–99)
Glucose-Capillary: 450 mg/dL — ABNORMAL HIGH (ref 70–99)
Glucose-Capillary: 459 mg/dL — ABNORMAL HIGH (ref 70–99)

## 2018-07-19 LAB — LACTIC ACID, PLASMA: Lactic Acid, Venous: 2 mmol/L (ref 0.5–1.9)

## 2018-07-19 LAB — MRSA PCR SCREENING: MRSA BY PCR: NEGATIVE

## 2018-07-19 LAB — AMMONIA: Ammonia: 60 umol/L — ABNORMAL HIGH (ref 9–35)

## 2018-07-19 MED ORDER — INSULIN ASPART 100 UNIT/ML ~~LOC~~ SOLN
0.0000 [IU] | Freq: Three times a day (TID) | SUBCUTANEOUS | Status: DC
  Administered 2018-07-20 (×2): 7 [IU] via SUBCUTANEOUS
  Administered 2018-07-20: 5 [IU] via SUBCUTANEOUS
  Administered 2018-07-21: 3 [IU] via SUBCUTANEOUS
  Filled 2018-07-19 (×4): qty 1

## 2018-07-19 MED ORDER — METOLAZONE 2.5 MG PO TABS
2.5000 mg | ORAL_TABLET | Freq: Every day | ORAL | Status: DC
  Administered 2018-07-20 – 2018-07-21 (×2): 2.5 mg via ORAL
  Filled 2018-07-19 (×3): qty 1

## 2018-07-19 MED ORDER — INSULIN ASPART 100 UNIT/ML ~~LOC~~ SOLN
16.0000 [IU] | Freq: Once | SUBCUTANEOUS | Status: AC
  Administered 2018-07-19: 16 [IU] via SUBCUTANEOUS
  Filled 2018-07-19: qty 1

## 2018-07-19 MED ORDER — INSULIN ASPART PROT & ASPART (70-30 MIX) 100 UNIT/ML ~~LOC~~ SUSP
65.0000 [IU] | Freq: Every day | SUBCUTANEOUS | Status: DC
  Administered 2018-07-19 – 2018-07-20 (×2): 65 [IU] via SUBCUTANEOUS
  Filled 2018-07-19 (×2): qty 10

## 2018-07-19 MED ORDER — FUROSEMIDE 40 MG PO TABS
40.0000 mg | ORAL_TABLET | Freq: Every day | ORAL | Status: DC
  Administered 2018-07-20 – 2018-07-21 (×2): 40 mg via ORAL
  Filled 2018-07-19 (×2): qty 1

## 2018-07-19 MED ORDER — INSULIN ASPART 100 UNIT/ML ~~LOC~~ SOLN
0.0000 [IU] | Freq: Every day | SUBCUTANEOUS | Status: DC
  Administered 2018-07-19: 5 [IU] via SUBCUTANEOUS
  Administered 2018-07-20: 3 [IU] via SUBCUTANEOUS
  Filled 2018-07-19 (×2): qty 1

## 2018-07-19 NOTE — Progress Notes (Signed)
[place] [place] NAME: Denise Bramblett    MR#:  [id number]  DATE OF BIRTH:  05/03/1951  SUBJECTIVE:   Patient presented to the hospital with fever, generalized weakness, elevated lactic acid and suspected to have sepsis.  Source is thought to be urinary tract infection.  Patient does complain of some intermittent dysuria.  Feels much better today and no other acute complaints.  REVIEW OF SYSTEMS:    Review of Systems  Constitutional: Negative for chills and fever.  HENT: Negative for congestion and tinnitus.   Eyes: Negative for blurred vision and double vision.  Respiratory: Negative for cough, shortness of breath and wheezing.   Cardiovascular: Negative for chest pain, orthopnea and PND.  Gastrointestinal: Negative for abdominal pain, diarrhea, nausea and vomiting.  Genitourinary: Negative for dysuria and hematuria.  Neurological: Positive for weakness. Negative for dizziness, sensory change and focal weakness.  All other systems reviewed and are negative.   Nutrition: Heart healthy Tolerating Diet: Yes Tolerating PT: Await Eval.   DRUG ALLERGIES:  No Known Allergies  VITALS:  Blood pressure (!) 135/54, pulse 69, temperature 98 F (36.7 C), temperature source Oral, resp. rate 20, height _0  (1.753 m), weight 126.5 kg, SpO2 98 %.  PHYSICAL EXAMINATION:   Physical Exam  GENERAL:  67 y.o.-year-old obese patient sitting up in chair in no acute distress.  EYES: Pupils equal, round, reactive to light and accommodation. No scleral icterus. Extraocular muscles intact.  HEENT: Head atraumatic, normocephalic. Oropharynx and nasopharynx clear.  NECK:  Supple, no jugular venous distention. No thyroid enlargement, no tenderness.  LUNGS: Normal breath sounds bilaterally, no wheezing, rales, rhonchi. No use of accessory muscles of respiration.  CARDIOVASCULAR: S1, S2 normal. No murmurs, rubs, or gallops.  ABDOMEN: Soft, nontender,  nondistended. Bowel sounds present. No organomegaly or mass.  EXTREMITIES: No cyanosis, clubbing or edema b/l.    NEUROLOGIC: Cranial nerves II through XII are intact. No focal Motor or sensory deficits b/l.   PSYCHIATRIC: The patient is alert and oriented x 3.  SKIN: No obvious rash, lesion, or ulcer.    LABORATORY PANEL:   CBC Recent Labs  Lab 07/19/18 0408  WBC 10.0  HGB 8.7*  HCT 24.0*  PLT 70*   ------------------------------------------------------------------------------------------------------------------  Chemistries  Recent Labs  Lab 07/18/18 1958 07/19/18 0408  NA 135 136  K 3.0* 3.0*  CL 97* 98  CO2 28 27  GLUCOSE 336* 336*  BUN 29* 33*  CREATININE 2.23* 2.32*  CALCIUM 9.7 9.0  AST 34  --   ALT 26  --   ALKPHOS 73  --   BILITOT 1.0  --    ------------------------------------------------------------------------------------------------------------------  Cardiac Enzymes No results for input(s): TROPONINI in the last 168 hours. ------------------------------------------------------------------------------------------------------------------  RADIOLOGY:  Dg Chest 2 View  Result Date: 07/18/2018 CLINICAL DATA:  Fever to 102. EXAM: CHEST - 2 VIEW COMPARISON:  06/18/2018 FINDINGS: The heart size and mediastinal contours are within normal limits. Stable mild elevation of the left hemidiaphragm. Coarsened interstitial lung markings without alveolar consolidation, effusion or pneumothorax. The visualized skeletal structures are unremarkable. IMPRESSION: Mild interstitial coarsening and thickening since prior. Stigmata of small airway inflammation/bronchitic change is a possibility. No alveolar consolidation to suggest bacterial pneumonia. Electronically Signed   By: Ashley Royalty M.D.   On: 07/18/2018 20:43     ASSESSMENT AND PLAN:   67 year old male with past medical history of hypertension, hyperlipidemia, diabetes, liver cirrhosis, obstructive sleep apnea,  GERD who presented to the hospital secondary to generalized weakness/fall and also noted to have a fever of 102.  1.  Sepsis-patient met criteria admission given his fever, leukocytosis, elevated lactic acid. -Source of the sepsis is thought to be urinary tract infection.  Now afebrile and hemodynamically stable.  Lactic acid has trended down. - Continue IV vancomycin, cefepime for now.  Will DC vancomycin if MRSA PCR is negative.  2.  Urinary tract infection-continue IV cefepime, follow urine cultures.  3.  History of chronic liver disease/cirrhosis- no evidence of hepatic encephalopathy.  Continue Xifaxan, lactulose. - cont. Lasix, Metolazone  4.  GERD-continue Pepcid.  5. DM Type II w/out complication - will resume Novolin 70/30.  - place on SSI and follow BS  6. Hypokalemia - due to diuretics.  - given PO potassium today and will repeat in a.m  - check Mg. Level.   Await PT eval   All the records are reviewed and case discussed with Care Management/Social Worker. Management plans discussed with the patient, family and they are in agreement.  CODE STATUS: Full code  DVT Prophylaxis: Ted's & SCD's.   TOTAL TIME TAKING CARE OF THIS PATIENT: 30 minutes.   POSSIBLE D/C IN 1-2 DAYS, DEPENDING ON CLINICAL CONDITION.   Henreitta Leber M.D on 07/19/2018 at 1:55 PM  Between 7am to 6pm - Pager - [phone]  After 6pm go to [email or web address] - Proofreader  [facility]  Office  [phone]  CC: Primary care physician; Baxter Hire, MD

## 2018-07-19 NOTE — Plan of Care (Signed)
  Problem: Nutrition: Goal: Adequate nutrition will be maintained Outcome: Completed/Met   Problem: Coping: Goal: Level of anxiety will decrease Outcome: Completed/Met   Problem: Elimination: Goal: Will not experience complications related to bowel motility Outcome: Completed/Met   Problem: Pain Managment: Goal: General experience of comfort will improve Outcome: Completed/Met   Problem: Safety: Goal: Ability to remain free from injury will improve Outcome: Completed/Met   

## 2018-07-19 NOTE — Progress Notes (Signed)
PT Cancellation Note  Patient Details Name: Corey West MRN: [id number] DOB: 10-16-1950   Cancelled Treatment:    Reason Eval/Treat Not Completed: Medical issues which prohibited therapy.  Order received.  Chart reviewed.  Per lab values, pt's glucose is 336 and several additional values are borderline outside of normal range.  PT will follow and re-attempt when pt is more appropriate.   Roxanne Gates, PT, DPT 07/19/2018, 12:09 PM

## 2018-07-19 NOTE — Progress Notes (Signed)
PHARMACY - PHYSICIAN COMMUNICATION CRITICAL VALUE ALERT - BLOOD CULTURE IDENTIFICATION (BCID)  Corey West is an 67 y.o. male who presented to [facility] on 07/18/2018 with a chief complaint of Sepsis.  Assessment:  Sepsis with urinary tract possible source. Blood culture 2 of 4 (aerobic) with GPC, BCID shows Staph species Methicillin resistance detected.  Name of physician (or Provider) Contacted: Sainani  Current antibiotics: Vancomycin and Cefepime  Changes to prescribed antibiotics recommended:  Patient is on recommended antibiotics - No changes needed  Results for orders placed or performed during the hospital encounter of 07/18/18  Blood Culture ID Panel (Reflexed) (Collected: 07/18/2018  7:58 PM)  Result Value Ref Range   Enterococcus species NOT DETECTED NOT DETECTED   Listeria monocytogenes NOT DETECTED NOT DETECTED   Staphylococcus species DETECTED (A) NOT DETECTED   Staphylococcus aureus (BCID) NOT DETECTED NOT DETECTED   Methicillin resistance DETECTED (A) NOT DETECTED   Streptococcus species NOT DETECTED NOT DETECTED   Streptococcus agalactiae NOT DETECTED NOT DETECTED   Streptococcus pneumoniae NOT DETECTED NOT DETECTED   Streptococcus pyogenes NOT DETECTED NOT DETECTED   Acinetobacter baumannii NOT DETECTED NOT DETECTED   Enterobacteriaceae species NOT DETECTED NOT DETECTED   Enterobacter cloacae complex NOT DETECTED NOT DETECTED   Escherichia coli NOT DETECTED NOT DETECTED   Klebsiella oxytoca NOT DETECTED NOT DETECTED   Klebsiella pneumoniae NOT DETECTED NOT DETECTED   Proteus species NOT DETECTED NOT DETECTED   Serratia marcescens NOT DETECTED NOT DETECTED   Haemophilus influenzae NOT DETECTED NOT DETECTED   Neisseria meningitidis NOT DETECTED NOT DETECTED   Pseudomonas aeruginosa NOT DETECTED NOT DETECTED   Candida albicans NOT DETECTED NOT DETECTED   Candida glabrata NOT DETECTED NOT DETECTED   Candida krusei NOT DETECTED NOT DETECTED   Candida  parapsilosis NOT DETECTED NOT DETECTED   Candida tropicalis NOT DETECTED NOT DETECTED    Vira Blanco 07/19/2018  3:39 PM

## 2018-07-20 LAB — CBC
HEMATOCRIT: 25.4 % — AB (ref 39.0–52.0)
Hemoglobin: 9.2 g/dL — ABNORMAL LOW (ref 13.0–17.0)
MCH: 32.7 pg (ref 26.0–34.0)
MCHC: 36.2 g/dL — AB (ref 30.0–36.0)
MCV: 90.4 fL (ref 80.0–100.0)
Platelets: 77 10*3/uL — ABNORMAL LOW (ref 150–400)
RBC: 2.81 MIL/uL — ABNORMAL LOW (ref 4.22–5.81)
RDW: 13.7 % (ref 11.5–15.5)
WBC: 6.2 10*3/uL (ref 4.0–10.5)
nRBC: 0 % (ref 0.0–0.2)

## 2018-07-20 LAB — MAGNESIUM: Magnesium: 2 mg/dL (ref 1.7–2.4)

## 2018-07-20 LAB — BASIC METABOLIC PANEL
Anion gap: 8 (ref 5–15)
BUN: 37 mg/dL — AB (ref 8–23)
CO2: 28 mmol/L (ref 22–32)
CREATININE: 2.13 mg/dL — AB (ref 0.61–1.24)
Calcium: 9.1 mg/dL (ref 8.9–10.3)
Chloride: 100 mmol/L (ref 98–111)
GFR calc Af Amer: 35 mL/min — ABNORMAL LOW (ref >60)
GFR calc non Af Amer: 31 mL/min — ABNORMAL LOW (ref >60)
Glucose, Bld: 276 mg/dL — ABNORMAL HIGH (ref 70–99)
Potassium: 3.4 mmol/L — ABNORMAL LOW (ref 3.5–5.1)
Sodium: 136 mmol/L (ref 135–145)

## 2018-07-20 LAB — GLUCOSE, CAPILLARY
GLUCOSE-CAPILLARY: 344 mg/dL — AB (ref 70–99)
GLUCOSE-CAPILLARY: 297 mg/dL — AB (ref 70–99)
Glucose-Capillary: 288 mg/dL — ABNORMAL HIGH (ref 70–99)
Glucose-Capillary: 334 mg/dL — ABNORMAL HIGH (ref 70–99)

## 2018-07-20 NOTE — Progress Notes (Signed)
[place] [facility] [place] NAME: Oakley Kossman    MR#:  [id number]  DATE OF BIRTH:  [birth year]  SUBJECTIVE:   Patient feels much better, weakness is improved.  Afebrile overnight.  Blood cultures are positive but it is growing coagulase-negative staph.  REVIEW OF SYSTEMS:    Review of Systems  Constitutional: Negative for chills and fever.  HENT: Negative for congestion and tinnitus.   Eyes: Negative for blurred vision and double vision.  Respiratory: Negative for cough, shortness of breath and wheezing.   Cardiovascular: Negative for chest pain, orthopnea and PND.  Gastrointestinal: Negative for abdominal pain, diarrhea, nausea and vomiting.  Genitourinary: Negative for dysuria and hematuria.  Neurological: Positive for weakness. Negative for dizziness, sensory change and focal weakness.  All other systems reviewed and are negative.   Nutrition: Heart healthy Tolerating Diet: Yes Tolerating PT: Await Eval.   DRUG ALLERGIES:  No Known Allergies  VITALS:  Blood pressure (!) 138/55, pulse 66, temperature 97.7 F (36.5 C), temperature source Oral, resp. rate 20, height '5\' 9"'$  (1.753 m), weight 126.5 kg, SpO2 100 %.  PHYSICAL EXAMINATION:   Physical Exam  GENERAL:  67 y.o.-year-old obese patient lying in bed in no acute distress.  EYES: Pupils equal, round, reactive to light and accommodation. No scleral icterus. Extraocular muscles intact.  HEENT: Head atraumatic, normocephalic. Oropharynx and nasopharynx clear.  NECK:  Supple, no jugular venous distention. No thyroid enlargement, no tenderness.  LUNGS: Normal breath sounds bilaterally, no wheezing, rales, rhonchi. No use of accessory muscles of respiration.  CARDIOVASCULAR: S1, S2 normal. No murmurs, rubs, or gallops.  ABDOMEN: Soft, nontender, nondistended. Bowel sounds present. No organomegaly or mass.  EXTREMITIES: No cyanosis, clubbing or edema b/l.    NEUROLOGIC: Cranial nerves II  through XII are intact. No focal Motor or sensory deficits b/l.   PSYCHIATRIC: The patient is alert and oriented x 3.  SKIN: No obvious rash, lesion, or ulcer.    LABORATORY PANEL:   CBC Recent Labs  Lab 07/20/18 0422  WBC 6.2  HGB 9.2*  HCT 25.4*  PLT 77*   ------------------------------------------------------------------------------------------------------------------  Chemistries  Recent Labs  Lab 07/18/18 1958  07/20/18 0422  NA 135   < > 136  K 3.0*   < > 3.4*  CL 97*   < > 100  CO2 28   < > 28  GLUCOSE 336*   < > 276*  BUN 29*   < > 37*  CREATININE 2.23*   < > 2.13*  CALCIUM 9.7   < > 9.1  MG  --   --  2.0  AST 34  --   --   ALT 26  --   --   ALKPHOS 73  --   --   BILITOT 1.0  --   --    < > = values in this interval not displayed.   ------------------------------------------------------------------------------------------------------------------  Cardiac Enzymes No results for input(s): TROPONINI in the last 168 hours. ------------------------------------------------------------------------------------------------------------------  RADIOLOGY:  Dg Chest 2 View  Result Date: 07/18/2018 CLINICAL DATA:  Fever to 102. EXAM: CHEST - 2 VIEW COMPARISON:  06/18/2018 FINDINGS: The heart size and mediastinal contours are within normal limits. Stable mild elevation of the left hemidiaphragm. Coarsened interstitial lung markings without alveolar consolidation, effusion or pneumothorax. The visualized skeletal structures are unremarkable. IMPRESSION: Mild interstitial coarsening and thickening since prior. Stigmata of small airway inflammation/bronchitic change is a possibility. No alveolar consolidation to suggest  bacterial pneumonia. Electronically Signed   By: Ashley Royalty M.D.   On: 07/18/2018 20:43     ASSESSMENT AND PLAN:   67 year old male with past medical history of hypertension, hyperlipidemia, diabetes, liver cirrhosis, obstructive sleep apnea, GERD who  presented to the hospital secondary to generalized weakness/fall and also noted to have a fever of 102.  1.  Sepsis-patient met criteria on admission given his fever, leukocytosis, elevated lactic acid. -Source of the sepsis is thought to be urinary tract infection.  Now afebrile and hemodynamically stable.  Lactic acid has trended down. -Patient's urine cultures remain negative, blood cultures 2 out of 4 bottles are growing gram-positive cocci which is methicillin-resistant but is coagulase-negative staph.  I will repeat blood cultures today.  For now empirically continue vancomycin and cefepime.  2.  Urinary tract infection-continue IV cefepime - urine cultures (-) so far.   3.  History of chronic liver disease/cirrhosis- no evidence of hepatic encephalopathy.  Continue Xifaxan, lactulose. - cont. Lasix, Metolazone  4.  GERD-continue Pepcid.  5. DM Type II w/out complication - cont. novolin 70/30 mix and SSI for now.   - follow BS  6. Hypokalemia - due to diuretics.  - improved w/ supplementation.  Mg. Level normal.    Await PT eval   All the records are reviewed and case discussed with Care Management/Social Worker. Management plans discussed with the patient, family and they are in agreement.  CODE STATUS: Full code  DVT Prophylaxis: Ted's & SCD's.   TOTAL TIME TAKING CARE OF THIS PATIENT: 30 minutes.   POSSIBLE D/C IN 1-2 DAYS, DEPENDING ON CLINICAL CONDITION.   Henreitta Leber M.D on 07/20/2018 at 12:21 PM  Between 7am to 6pm - Pager - [phone]  After 6pm go to [email or web address] - Proofreader  [facility]  Office  [phone]  CC: Primary care physician; Baxter Hire, MD

## 2018-07-20 NOTE — Evaluation (Signed)
Physical Therapy Evaluation Patient Details Name: Corey West MRN: [id number] DOB: May 05, 1951 Today's Date: 07/20/2018   History of Present Illness  Patient is a pleasant 67 year old male who presented to the hospital with fever, generalized weakness, elevated lactic acid and was suspected to have sepsis.  Per physician report source was thought to be urinary tract infection. PMH includes CKD, liver cirrhosis, anmeia, AVM of stomach, Barrett's esophagus, DM, HTN, venous stasis BLE's.  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   Clinical Impression  Patient is a pleasant 67 year old male who presented to the hospital for sepsis. Patient has had multiple admissions in the past few weeks resulting in decreased strength of BLE's and capacity for functional mobility. Patient was using cane prior to admission however now will benefit from use of walker to ambulate to reduce falls risk, use of walker allowed patient to ambulate around unit with CGA and good step length and stability. Patient will benefit from use of shower chair to reduce fall risk in shower due to limited stability without UE support. Patient will benefit from physical therapy while in the hospital to increase LE strength and functional mobility capacity. Upon d/c patient will benefit from home health PT to reduce fall risk and increase functional mobility for return to PLOF.     Follow Up Recommendations Home health PT    Equipment Recommendations  (shower chair)    Recommendations for Other Services       Precautions / Restrictions Precautions Precautions: None Restrictions Weight Bearing Restrictions: No Other Position/Activity Restrictions: stage II ulcer buttock      Mobility  Bed Mobility                  Transfers Overall transfer level: Modified independent Equipment used: Rolling walker (2 wheeled) Transfers: Sit to/from Stand           General transfer comment: Patient  transfers STS with BUE support to RW with supervision  Ambulation/Gait Ambulation/Gait assistance: Supervision Gait Distance (Feet): 150 Feet Assistive device: Standard walker Gait Pattern/deviations: Step-through pattern   Gait velocity interpretation: >2.62 ft/sec, indicative of community ambulatory General Gait Details: Patient ambulates with equal step length, limited toe off propulsion bilaterally due to swelling limiting df.   Stairs            Wheelchair Mobility    Modified Rankin (Stroke Patients Only)       Balance Overall balance assessment: Needs assistance Sitting-balance support: Feet supported Sitting balance-Leahy Scale: Good Sitting balance - Comments: able to reach within and outside BOS without LOB   Standing balance support: Bilateral upper extremity supported;During functional activity Standing balance-Leahy Scale: Fair Standing balance comment: Patient utilizes RW for ambulation for dynamic stability however demonstrates ability to retain standing position with SUE to no UE support for ~ 60 seconds.                              Pertinent Vitals/Pain Pain Assessment: No/denies pain    Home Living Family/patient expects to be discharged to:: Private residence Living Arrangements: Alone Available Help at Discharge: Family(niece lives nearby) Type of Home: Apartment(condo) Home Access: Stairs to enter Entrance Stairs-Rails: Right;Left(cannot reach both) Technical brewer of Steps: 3-4 stairs (uses R railing)  Home Layout: One level Home Equipment: Environmental consultant - 2 wheels;Cane - single point Additional Comments: Patient reports he is worried about showering, wants to get a  shower seat.     Prior Function Level of Independence: Independent with assistive device(s)         Comments: Patient utilizes [facility] for ambulation however is able to use walker. Works part time as a Chief Strategy Officer, he is able to drive however frequently works from  home.      Hand Dominance        Extremity/Trunk Assessment   Upper Extremity Assessment Upper Extremity Assessment: Overall WFL for tasks assessed    Lower Extremity Assessment Lower Extremity Assessment: LLE deficits/detail;RLE deficits/detail RLE Deficits / Details: 4/5 gross assessment  RLE Sensation: WNL RLE Coordination: decreased gross motor LLE Deficits / Details: 4/5 gross assessment LLE Sensation: WNL LLE Coordination: decreased gross motor       Communication   Communication: No difficulties  Cognition Arousal/Alertness: Awake/alert Behavior During Therapy: WFL for tasks assessed/performed Overall Cognitive Status: Within Functional Limits for tasks assessed                                 General Comments: Patient is alert and eager for d/c to return home.       General Comments General comments (skin integrity, edema, etc.):  bilateral venous stasis of LE's    Exercises Other Exercises Other Exercises: seated: sit edge of chair without back support stability reaching inside and outside BOS, ankle pumps 10x for swelling, marches 10x  Other Exercises: Static stand with reduction of UE support: ~60 seconds without LOB   Assessment/Plan    PT Assessment Patient needs continued PT services  PT Problem List Decreased strength;Decreased activity tolerance;Decreased balance;Decreased mobility       PT Treatment Interventions DME instruction;Gait training;Stair training;Functional mobility training;Neuromuscular re-education;Balance training;Therapeutic exercise;Therapeutic activities;Patient/family education    PT Goals (Current goals can be found in the Care Plan section)  Acute Rehab PT Goals Patient Stated Goal: to return home PT Goal Formulation: With patient Time For Goal Achievement: 08/03/18 Potential to Achieve Goals: Fair    Frequency Min 2X/week   Barriers to discharge        Co-evaluation               AM-PAC PT "6  Clicks" Daily Activity  Outcome Measure Difficulty turning over in bed (including adjusting bedclothes, sheets and blankets)?: None Difficulty moving from lying on back to sitting on the side of the bed? : A Lot Difficulty sitting down on and standing up from a chair with arms (e.g., wheelchair, bedside commode, etc,.)?: A Little Help needed moving to and from a bed to chair (including a wheelchair)?: A Little Help needed walking in hospital room?: A Little Help needed climbing 3-5 steps with a railing? : A Little 6 Click Score: 18    End of Session Equipment Utilized During Treatment: Gait belt Activity Tolerance: Patient tolerated treatment well Patient left: in chair;with chair alarm set;with call bell/phone within reach Nurse Communication: Mobility status PT Visit Diagnosis: Other abnormalities of gait and mobility (R26.89);Unsteadiness on feet (R26.81);Muscle weakness (generalized) (M62.81)    Time: 3785-8850 PT Time Calculation (min) (ACUTE ONLY): 28 min   Charges:   PT Evaluation $PT Eval Low Complexity: 1 Low PT Treatments $Gait Training: 8-22 mins        Janna Arch, PT, DPT    Janna Arch 07/20/2018, 2:56 PM

## 2018-07-21 LAB — CBC
HCT: 27.2 % — ABNORMAL LOW (ref 39.0–52.0)
Hemoglobin: 9.9 g/dL — ABNORMAL LOW (ref 13.0–17.0)
MCH: 32.7 pg (ref 26.0–34.0)
MCHC: 36.4 g/dL — AB (ref 30.0–36.0)
MCV: 89.8 fL (ref 80.0–100.0)
Platelets: 87 10*3/uL — ABNORMAL LOW (ref 150–400)
RBC: 3.03 MIL/uL — ABNORMAL LOW (ref 4.22–5.81)
RDW: 13.7 % (ref 11.5–15.5)
WBC: 5.3 10*3/uL (ref 4.0–10.5)
nRBC: 0 % (ref 0.0–0.2)

## 2018-07-21 LAB — BASIC METABOLIC PANEL
Anion gap: 10 (ref 5–15)
BUN: 39 mg/dL — ABNORMAL HIGH (ref 8–23)
CHLORIDE: 100 mmol/L (ref 98–111)
CO2: 27 mmol/L (ref 22–32)
Calcium: 9.3 mg/dL (ref 8.9–10.3)
Creatinine, Ser: 1.96 mg/dL — ABNORMAL HIGH (ref 0.61–1.24)
GFR calc non Af Amer: 34 mL/min — ABNORMAL LOW (ref >60)
GFR, EST AFRICAN AMERICAN: 39 mL/min — AB (ref >60)
Glucose, Bld: 217 mg/dL — ABNORMAL HIGH (ref 70–99)
Potassium: 3.5 mmol/L (ref 3.5–5.1)
SODIUM: 137 mmol/L (ref 135–145)

## 2018-07-21 LAB — GLUCOSE, CAPILLARY: GLUCOSE-CAPILLARY: 220 mg/dL — AB (ref 70–99)

## 2018-07-21 MED ORDER — GERHARDT'S BUTT CREAM
TOPICAL_CREAM | Freq: Two times a day (BID) | CUTANEOUS | Status: DC
  Filled 2018-07-21: qty 1

## 2018-07-21 MED ORDER — CEFUROXIME AXETIL 250 MG PO TABS: 250.0000 mg | ORAL_TABLET | Freq: Two times a day (BID) | ORAL | 0 refills | Status: AC

## 2018-07-21 NOTE — Consult Note (Signed)
[place] Nurse wound consult note Reason for Consult: Moisture associated skin damage to bilateral buttocks in gluteal fold.  MASD to groin and perineal skin as well.  Due to body habitus, patient admits treating this area can be difficult.  Wound type:MASD  Pressure Injury POA: NA Measurement: 1 cm x 2 cm x 0.2 cm areas with nonintact center Wound bed:  Pink and moist Erythema and denuded skin in inguinal fold and beneath abdominal pannus Drainage (amount, consistency, odor) scant weeping Periwound:intact Dressing procedure/placement/frequency: cleanse perineal area and buttocks with soap and water and pat dry.  Apply Gerhardts butt paste twice daily and PRn Soilage.   Will not follow at this time.  Please re-consult if needed.  Domenic Moras MSN, RN, FNP-BC [facility] Wound, Ostomy, Continence Nurse Pager [phone]

## 2018-07-21 NOTE — Discharge Summary (Signed)
[facility] [place] NAME: Corey West    MR#:  [id number]  DATE OF BIRTH:  01-05-1951  DATE OF ADMISSION:  07/18/2018 ADMITTING PHYSICIAN: Lance Coon, MD  DATE OF DISCHARGE: 07/21/2018 10:50 AM  PRIMARY CARE PHYSICIAN: Baxter Hire, MD    ADMISSION DIAGNOSIS:  Cellulitis of left lower extremity [L03.116] Acute cystitis without hematuria [N30.00] Severe sepsis ([place]) [A41.9, R65.20]  DISCHARGE DIAGNOSIS:  Principal Problem:   Sepsis ([place]) Active Problems:   Cirrhosis ([place])   Type 2 diabetes mellitus (HCC)   Gastroesophageal reflux disease   Hyperlipidemia   UTI (urinary tract infection)   Pressure injury of skin   SECONDARY DIAGNOSIS:   Past Medical History:  Diagnosis Date  . Anemia   . Anxiety    controlled;   . Arthritis   . AVM (arteriovenous malformation) of stomach, acquired with hemorrhage   . Barrett's esophagus   . Chronic kidney disease    renal infufficiency  . Cirrhosis ([place])   . Depression    controlled;   Marland Kitchen Diabetes mellitus without complication ([place])    not controlled, taking insulin but sugar continues to run high;   . Edema   . Esophageal varices ([place])   . GERD (gastroesophageal reflux disease)   . History of hiatal hernia   . Hyperlipidemia   . Hypertension    controlled well;   . 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 Obesity   . Pancytopenia ([place])   . Polyp, stomach    with chronic blood loss  . Sleep apnea    does not wear a cpap, Medicare would not pay for it   . Venous stasis dermatitis of both lower extremities   . Venous stasis of both lower extremities    with cellulitis    HOSPITAL COURSE:   67 year old male with past medical history of hypertension, hyperlipidemia, diabetes, liver cirrhosis, obstructive sleep apnea, GERD who presented to the hospital secondary to generalized weakness/fall and also noted to have a fever of 102.  1.  Sepsis-patient met criteria on admission given his  fever, leukocytosis, elevated lactic acid. - This was the working diagnosis on admission but this has not been ruled out.  Patient's blood cultures were positive for coagulase-negative staph which is a skin contaminant.  Blood cultures were repeated and they are currently negative. - Patient did have a urinary tract infection although did not lead to bacteremia/septicemia. -Initially patient was on IV vancomycin and cefepime but now being discharged on just oral Ceftin for a few more days to treat underlying UTI.  2.  Urinary tract infection- while in the hospital patient was on IV cefepime not being discharged on oral Ceftin.  3.  History of chronic liver disease/cirrhosis- no evidence of hepatic encephalopathy while in The hospital.  he will Continue Xifaxan, lactulose. - he will cont. Lasix, Metolazone  4.  GERD- he will continue Omeprazole.  5. DM Type II w/out complication - cont. novolin 70/30 mix and SSI for now.   -Patient's blood sugars were somewhat uncontrolled but patient will continue follow-up with his primary care physician for further adjustments of his insulin.  6. Hypokalemia -due to patient being on diuretics but improved and resolved with supplementation.  Patient was seen by physical therapy and the recommended home health services but the patient refused that presently.  He is therefore being discharged home now.  DISCHARGE CONDITIONS:   Stable.   CONSULTS OBTAINED:    DRUG ALLERGIES:  No Known Allergies  DISCHARGE MEDICATIONS:   Allergies as of 07/21/2018   No Known Allergies     Medication List    TAKE these medications   cefUROXime 250 MG tablet Commonly known as:  CEFTIN Take 1 tablet (250 mg total) by mouth 2 (two) times daily with a meal for 3 days.   docusate sodium 100 MG capsule Commonly known as:  COLACE Take 100 mg by mouth 2 (two) times daily.   furosemide 40 MG tablet Commonly known as:  LASIX Take 40 mg by mouth daily.    insulin aspart protamine - aspart (70-30) 100 UNIT/ML FlexPen Commonly known as:  NOVOLOG 70/30 MIX Inject 84 Units into the skin daily with breakfast.   insulin aspart protamine - aspart (70-30) 100 UNIT/ML FlexPen Commonly known as:  NOVOLOG 70/30 MIX Inject 65 Units into the skin at bedtime.   lactulose 10 GM/15ML solution Commonly known as:  CHRONULAC Take 30 mLs by mouth 2 (two) times daily.   LORazepam 0.5 MG tablet Commonly known as:  ATIVAN Take 0.5 mg by mouth every 8 (eight) hours as needed for anxiety.   metolazone 2.5 MG tablet Commonly known as:  ZAROXOLYN Take 2.5 mg by mouth daily.   multivitamin tablet Take 1 tablet by mouth daily.   omeprazole 40 MG capsule Commonly known as:  PRILOSEC Take 40 mg by mouth daily.   Potassium Chloride ER 20 MEQ Tbcr Take 40 mEq by mouth daily.   potassium chloride SA 20 MEQ tablet Commonly known as:  K-DUR,KLOR-CON Take 20 mEq by mouth at bedtime.   ranitidine 150 MG capsule Commonly known as:  ZANTAC Take 150 mg by mouth 2 (two) times daily as needed for heartburn.   rifaximin 550 MG Tabs tablet Commonly known as:  XIFAXAN Take 1 tablet (550 mg total) by mouth 2 (two) times daily.         DISCHARGE INSTRUCTIONS:   DIET:  Cardiac diet  DISCHARGE CONDITION:  Stable  ACTIVITY:  Activity as tolerated  OXYGEN:  Home Oxygen: No.   Oxygen Delivery: room air  DISCHARGE LOCATION:  home   If you experience worsening of your admission symptoms, develop shortness of breath, life threatening emergency, suicidal or homicidal thoughts you must seek medical attention immediately by calling 911 or calling your MD immediately  if symptoms less severe.  You Must read complete instructions/literature along with all the possible adverse reactions/side effects for all the Medicines you take and that have been prescribed to you. Take any new Medicines after you have completely understood and accpet all the possible  adverse reactions/side effects.   Please note  You were cared for by a hospitalist during your hospital stay. If you have any questions about your discharge medications or the care you received while you were in the hospital after you are discharged, you can call the unit and asked to speak with the hospitalist on call if the hospitalist that took care of you is not available. Once you are discharged, your primary care physician will handle any further medical issues. Please note that NO REFILLS for any discharge medications will be authorized once you are discharged, as it is imperative that you return to your primary care physician (or establish a relationship with a primary care physician if you do not have one) for your aftercare needs so that they can reassess your need for medications and monitor your lab values.     Today   Afebrile overnight, hemodynamically stable.  Repeat blood cultures are negative for bacteremia.  Patient overall feels well and therefore will be discharged home.  VITAL SIGNS:  Blood pressure (!) 143/61, pulse (!) 56, temperature 97.7 F (36.5 C), temperature source Oral, resp. rate 20, height '5\' 9"'$  (1.753 m), weight 126.5 kg, SpO2 100 %.  I/O:    Intake/Output Summary (Last 24 hours) at 07/21/2018 1520 Last data filed at 07/21/2018 1037 Gross per 24 hour  Intake 940 ml  Output 925 ml  Net 15 ml    PHYSICAL EXAMINATION:   GENERAL:  67 y.o.-year-old obese patient sitting up in chair in no acute distress.  EYES: Pupils equal, round, reactive to light and accommodation. No scleral icterus. Extraocular muscles intact.  HEENT: Head atraumatic, normocephalic. Oropharynx and nasopharynx clear.  NECK:  Supple, no jugular venous distention. No thyroid enlargement, no tenderness.  LUNGS: Normal breath sounds bilaterally, no wheezing, rales, rhonchi. No use of accessory muscles of respiration.  CARDIOVASCULAR: S1, S2 normal. No murmurs, rubs, or gallops.  ABDOMEN:  Soft, nontender, nondistended. Bowel sounds present. No organomegaly or mass.  EXTREMITIES: No cyanosis, clubbing, +1-2 edema b/l.    NEUROLOGIC: Cranial nerves II through XII are intact. No focal Motor or sensory deficits b/l.   PSYCHIATRIC: The patient is alert and oriented x 3.  SKIN: No obvious rash, lesion, or ulcer.   DATA REVIEW:   CBC Recent Labs  Lab 07/21/18 0655  WBC 5.3  HGB 9.9*  HCT 27.2*  PLT 87*    Chemistries  Recent Labs  Lab 07/18/18 1958  07/20/18 0422 07/21/18 0655  NA 135   < > 136 137  K 3.0*   < > 3.4* 3.5  CL 97*   < > 100 100  CO2 28   < > 28 27  GLUCOSE 336*   < > 276* 217*  BUN 29*   < > 37* 39*  CREATININE 2.23*   < > 2.13* 1.96*  CALCIUM 9.7   < > 9.1 9.3  MG  --   --  2.0  --   AST 34  --   --   --   ALT 26  --   --   --   ALKPHOS 73  --   --   --   BILITOT 1.0  --   --   --    < > = values in this interval not displayed.    Cardiac Enzymes No results for input(s): TROPONINI in the last 168 hours.  Microbiology Results  Results for orders placed or performed during the hospital encounter of 07/18/18  Blood Culture (routine x 2)     Status: Abnormal (Preliminary result)   Collection Time: 07/18/18  7:58 PM  Result Value Ref Range Status   Specimen Description   Final    BLOOD BLOOD RIGHT HAND Performed at [facility], [address], [place], [place] [address]    Special Requests   Final    BOTTLES DRAWN AEROBIC AND ANAEROBIC Blood Culture results may not be optimal due to an excessive volume of blood received in culture bottles Performed at [facility], [address] [place], [place] [address]    Culture  Setup Time   Final    Organism ID to follow [place] TO, READ BACK BY AND VERIFIED WITH: LISA KLUTTZ @ 5009 ON 07/19/2018 BY CAF Performed at [facility], [address] [place], [place] [address]  Culture (A)  Final     STAPHYLOCOCCUS SPECIES (COAGULASE NEGATIVE) SUSCEPTIBILITIES TO FOLLOW Performed at [facility], [name] [address], [place], [place] [address]    Report Status PENDING  Incomplete  Blood Culture ID Panel (Reflexed)     Status: Abnormal   Collection Time: 07/18/18  7:58 PM  Result Value Ref Range Status   Enterococcus species NOT DETECTED NOT DETECTED Final   Listeria monocytogenes NOT DETECTED NOT DETECTED Final   Staphylococcus species DETECTED (A) NOT DETECTED Final    Comment: Methicillin (oxacillin) resistant coagulase negative staphylococcus. Possible blood culture contaminant (unless isolated from more than one blood culture draw or clinical case suggests pathogenicity). No antibiotic treatment is indicated for blood  culture contaminants. CRITICAL RESULT CALLED TO, READ BACK BY AND VERIFIED WITH: LISA KLUTTZ @ 8250 ON 07/19/2018 BY CAF    Staphylococcus aureus (BCID) NOT DETECTED NOT DETECTED Final   Methicillin resistance DETECTED (A) NOT DETECTED Final    Comment: CRITICAL RESULT CALLED TO, READ BACK BY AND VERIFIED WITH: LISA KLUTTZ @ 1510 ON 07/19/18 BY CAF    Streptococcus species NOT DETECTED NOT DETECTED Final   Streptococcus agalactiae NOT DETECTED NOT DETECTED Final   Streptococcus pneumoniae NOT DETECTED NOT DETECTED Final   Streptococcus pyogenes NOT DETECTED NOT DETECTED Final   Acinetobacter baumannii NOT DETECTED NOT DETECTED Final   Enterobacteriaceae species NOT DETECTED NOT DETECTED Final   Enterobacter cloacae complex NOT DETECTED NOT DETECTED Final   Escherichia coli NOT DETECTED NOT DETECTED Final   Klebsiella oxytoca NOT DETECTED NOT DETECTED Final   Klebsiella pneumoniae NOT DETECTED NOT DETECTED Final   Proteus species NOT DETECTED NOT DETECTED Final   Serratia marcescens NOT DETECTED NOT DETECTED Final   Haemophilus influenzae NOT DETECTED NOT DETECTED Final   Neisseria meningitidis NOT DETECTED NOT DETECTED Final   Pseudomonas aeruginosa NOT  DETECTED NOT DETECTED Final   Candida albicans NOT DETECTED NOT DETECTED Final   Candida glabrata NOT DETECTED NOT DETECTED Final   Candida krusei NOT DETECTED NOT DETECTED Final   Candida parapsilosis NOT DETECTED NOT DETECTED Final   Candida tropicalis NOT DETECTED NOT DETECTED Final    Comment: Performed at [facility], [address] [place], [place] [address]  Blood Culture (routine x 2)     Status: Abnormal (Preliminary result)   Collection Time: 07/18/18  8:06 PM  Result Value Ref Range Status   Specimen Description   Final    BLOOD LEFT HAND Performed at [facility], [address] [place], [place] [address]    Special Requests   Final    NONE Performed at [facility], [address] [place], [place] [address]    Culture  Setup Time   Final    GRAM POSITIVE COCCI AEROBIC BOTTLE ONLY CRITICAL RESULT CALLED TO, READ BACK BY AND VERIFIED WITH: LISA KLUTTZ @ 1510 ON 07/19/2018 BY CAF Performed at [facility], [place],  [address]    Culture STAPHYLOCOCCUS SPECIES (COAGULASE NEGATIVE) (A)  Final   Report Status PENDING  Incomplete  MRSA PCR Screening     Status: None   Collection Time: 07/19/18 11:48 AM  Result Value Ref Range Status   MRSA by PCR NEGATIVE NEGATIVE Final    Comment:        The GeneXpert MRSA Assay (FDA approved for NASAL specimens only), is one component of a comprehensive MRSA colonization surveillance program. It is not intended to diagnose MRSA infection  nor to guide or monitor treatment for MRSA infections. Performed at [facility], [address] [place], [place] [address]   CULTURE, BLOOD (ROUTINE X 2) w Reflex to ID Panel     Status: None (Preliminary result)   Collection Time: 07/20/18 11:34 AM  Result Value Ref Range Status   Specimen Description BLOOD LEFT ANTECUBITAL  Final   Special Requests   Final    BOTTLES DRAWN AEROBIC AND ANAEROBIC Blood Culture  adequate volume   Culture   Final    NO GROWTH < 24 HOURS Performed at [facility], [address] [place], [place] [address]    Report Status PENDING  Incomplete  CULTURE, BLOOD (ROUTINE X 2) w Reflex to ID Panel     Status: None (Preliminary result)   Collection Time: 07/20/18 11:34 AM  Result Value Ref Range Status   Specimen Description BLOOD BLOOD LEFT HAND  Final   Special Requests   Final    BOTTLES DRAWN AEROBIC AND ANAEROBIC Blood Culture adequate volume   Culture   Final    NO GROWTH < 24 HOURS Performed at [facility], [address] [place], [place] [address]    Report Status PENDING  Incomplete    RADIOLOGY:  No results found.    Management plans discussed with the patient, family and they are in agreement.  CODE STATUS:  Code Status History    Date Active Date Inactive Code Status Order ID Comments User Context   07/18/2018 2339 07/21/2018 1356 Full Code [id number]  Lance Coon, MD Inpatient   TOTAL TIME TAKING CARE OF THIS PATIENT: 40 minutes.    Henreitta Leber M.D on 07/21/2018 at 3:20 PM  Between 7am to 6pm - Pager - [phone]  After 6pm go to [email or web address] - Proofreader  [facility]  Hospitalists  Office  [phone]  CC: Primary care physician; Baxter Hire, MD

## 2018-07-21 NOTE — Care Management Note (Signed)
Case Management Note  Patient Details  Name: Corey West MRN: [id number] Date of Birth: 1951-07-09  Subjective/Objective:  Met with patient at bedside to discuss discharge planning and possible home health services. He lives alone. PCP is Dr. Wynetta Emery. He has a cane and a walker that he uses.  Patient admits to driving and working a a part time job daily 8 am to 12 noon. I explained how he must be home bound. He verbalized understanding. Will close case. Provided patient with access to case manager if needs arise.                  Action/Plan:   Expected Discharge Date:  07/21/18               Expected Discharge Plan:  Home/Self Care  In-House Referral:     Discharge planning Services  CM Consult, Homebound not met per provider  Post Acute Care Choice:    Choice offered to:     DME Arranged:    DME Agency:     HH Arranged:    HH Agency:     Status of Service:  Completed, signed off  If discussed at [employer], dates discussed:    Additional Comments:  Jolly Mango, RN 07/21/2018, 9:59 AM

## 2018-07-21 NOTE — Care Management Important Message (Signed)
Important Message  Patient Details  Name: Corey West MRN: [id number] Date of Birth: May 05, 1951   Medicare Important Message Given:  Yes    Juliann Pulse A Melady Chow 07/21/2018, 10:42 AM

## 2018-07-22 LAB — CULTURE, BLOOD (ROUTINE X 2)

## 2018-07-23 ENCOUNTER — Telehealth: Payer: Self-pay

## 2018-07-23 NOTE — Telephone Encounter (Signed)
EMMI Follow-up: Mr. Pichon received call from my number and he shows on the Future patient list.  I let Mr. Ferrall know about our process of two automated calls post discharge and let him know that he would receive another one in a day or two.  Michela Pitcher he was at work and was only working partial days as he was not feeling well and was leaving to go home.  He has a follow-up appointment on Friday with PCP.  No needs noted at this time.

## 2018-07-25 LAB — CULTURE, BLOOD (ROUTINE X 2)
CULTURE: NO GROWTH
Culture: NO GROWTH
Special Requests: ADEQUATE
Special Requests: ADEQUATE

## 2018-08-07 ENCOUNTER — Encounter: Payer: Self-pay | Admitting: *Deleted

## 2018-08-08 ENCOUNTER — Ambulatory Visit: Payer: Medicare Other | Admitting: Anesthesiology

## 2018-08-08 ENCOUNTER — Encounter: Payer: Self-pay | Admitting: Anesthesiology

## 2018-08-08 ENCOUNTER — Encounter
Admission: RE | Disposition: A | Discharge status: Home / Self Care | Payer: Self-pay | Source: Ambulatory Visit | Attending: Unknown Physician Specialty

## 2018-08-08 ENCOUNTER — Ambulatory Visit
Admission: RE | Admit: 2018-08-08 | Discharge: 2018-08-08 | Disposition: A | Discharge status: Home / Self Care | Payer: Medicare Other | Source: Ambulatory Visit | Attending: Unknown Physician Specialty | Admitting: Unknown Physician Specialty

## 2018-08-08 DIAGNOSIS — E1122 Type 2 diabetes mellitus with diabetic chronic kidney disease: Secondary | ICD-10-CM | POA: Insufficient documentation

## 2018-08-08 DIAGNOSIS — E669 Obesity, unspecified: Secondary | ICD-10-CM | POA: Insufficient documentation

## 2018-08-08 DIAGNOSIS — E785 Hyperlipidemia, unspecified: Secondary | ICD-10-CM | POA: Diagnosis not present

## 2018-08-08 DIAGNOSIS — Z6841 Body Mass Index (BMI) 40.0 and over, adult: Secondary | ICD-10-CM | POA: Diagnosis not present

## 2018-08-08 DIAGNOSIS — F329 Major depressive disorder, single episode, unspecified: Secondary | ICD-10-CM | POA: Insufficient documentation

## 2018-08-08 DIAGNOSIS — K317 Polyp of stomach and duodenum: Secondary | ICD-10-CM | POA: Diagnosis not present

## 2018-08-08 DIAGNOSIS — I129 Hypertensive chronic kidney disease with stage 1 through stage 4 chronic kidney disease, or unspecified chronic kidney disease: Secondary | ICD-10-CM | POA: Diagnosis not present

## 2018-08-08 DIAGNOSIS — K219 Gastro-esophageal reflux disease without esophagitis: Secondary | ICD-10-CM | POA: Diagnosis not present

## 2018-08-08 DIAGNOSIS — M1990 Unspecified osteoarthritis, unspecified site: Secondary | ICD-10-CM | POA: Diagnosis not present

## 2018-08-08 DIAGNOSIS — F419 Anxiety disorder, unspecified: Secondary | ICD-10-CM | POA: Insufficient documentation

## 2018-08-08 DIAGNOSIS — K2270 Barrett's esophagus without dysplasia: Secondary | ICD-10-CM | POA: Diagnosis not present

## 2018-08-08 DIAGNOSIS — D500 Iron deficiency anemia secondary to blood loss (chronic): Secondary | ICD-10-CM | POA: Insufficient documentation

## 2018-08-08 DIAGNOSIS — K7460 Unspecified cirrhosis of liver: Secondary | ICD-10-CM | POA: Insufficient documentation

## 2018-08-08 DIAGNOSIS — G4730 Sleep apnea, unspecified: Secondary | ICD-10-CM | POA: Diagnosis not present

## 2018-08-08 DIAGNOSIS — N189 Chronic kidney disease, unspecified: Secondary | ICD-10-CM | POA: Diagnosis not present

## 2018-08-08 DIAGNOSIS — F17290 Nicotine dependence, other tobacco product, uncomplicated: Secondary | ICD-10-CM | POA: Diagnosis not present

## 2018-08-08 HISTORY — DX: Angiodysplasia of stomach and duodenum without bleeding: K31.819

## 2018-08-08 HISTORY — PX: ESOPHAGOGASTRODUODENOSCOPY (EGD) WITH PROPOFOL: SHX5813

## 2018-08-08 LAB — HEPATIC FUNCTION PANEL
ALT: 22 U/L (ref 0–44)
AST: 28 U/L (ref 15–41)
Albumin: 3.1 g/dL — ABNORMAL LOW (ref 3.5–5.0)
Alkaline Phosphatase: 77 U/L (ref 38–126)
BILIRUBIN DIRECT: 0.2 mg/dL (ref 0.0–0.2)
Indirect Bilirubin: 1 mg/dL — ABNORMAL HIGH (ref 0.3–0.9)
TOTAL PROTEIN: 6.3 g/dL — AB (ref 6.5–8.1)
Total Bilirubin: 1.2 mg/dL (ref 0.3–1.2)

## 2018-08-08 LAB — CBC
HCT: 26.7 % — ABNORMAL LOW (ref 39.0–52.0)
HEMOGLOBIN: 9.7 g/dL — AB (ref 13.0–17.0)
MCH: 33.1 pg (ref 26.0–34.0)
MCHC: 36.3 g/dL — AB (ref 30.0–36.0)
MCV: 91.1 fL (ref 80.0–100.0)
Platelets: 93 10*3/uL — ABNORMAL LOW (ref 150–400)
RBC: 2.93 MIL/uL — ABNORMAL LOW (ref 4.22–5.81)
RDW: 13.8 % (ref 11.5–15.5)
WBC: 4.5 10*3/uL (ref 4.0–10.5)
nRBC: 0 % (ref 0.0–0.2)

## 2018-08-08 LAB — GLUCOSE, CAPILLARY: GLUCOSE-CAPILLARY: 273 mg/dL — AB (ref 70–99)

## 2018-08-08 SURGERY — ESOPHAGOGASTRODUODENOSCOPY (EGD) WITH PROPOFOL
Anesthesia: General

## 2018-08-08 MED ORDER — FENTANYL CITRATE (PF) 100 MCG/2ML IJ SOLN
INTRAMUSCULAR | Status: AC
  Filled 2018-08-08: qty 2

## 2018-08-08 MED ORDER — GLYCOPYRROLATE 0.2 MG/ML IJ SOLN
INTRAMUSCULAR | Status: DC | PRN
  Administered 2018-08-08: 0.2 mg via INTRAVENOUS

## 2018-08-08 MED ORDER — PROPOFOL 500 MG/50ML IV EMUL
INTRAVENOUS | Status: DC | PRN
  Administered 2018-08-08: 50 ug/kg/min via INTRAVENOUS

## 2018-08-08 MED ORDER — LIDOCAINE HCL (PF) 1 % IJ SOLN
2.0000 mL | Freq: Once | INTRAMUSCULAR | Status: AC
  Administered 2018-08-08: 0.3 mL via INTRADERMAL

## 2018-08-08 MED ORDER — GLYCOPYRROLATE 0.2 MG/ML IJ SOLN
INTRAMUSCULAR | Status: AC
  Filled 2018-08-08: qty 1

## 2018-08-08 MED ORDER — LIDOCAINE HCL (PF) 1 % IJ SOLN
INTRAMUSCULAR | Status: AC
  Administered 2018-08-08: 0.3 mL via INTRADERMAL
  Filled 2018-08-08: qty 2

## 2018-08-08 MED ORDER — MIDAZOLAM HCL 5 MG/5ML IJ SOLN
INTRAMUSCULAR | Status: DC | PRN
  Administered 2018-08-08: 2 mg via INTRAVENOUS

## 2018-08-08 MED ORDER — LIDOCAINE HCL (PF) 2 % IJ SOLN
INTRAMUSCULAR | Status: DC | PRN
  Administered 2018-08-08: 100 mg

## 2018-08-08 MED ORDER — PROPOFOL 500 MG/50ML IV EMUL
INTRAVENOUS | Status: AC
  Filled 2018-08-08: qty 50

## 2018-08-08 MED ORDER — FENTANYL CITRATE (PF) 100 MCG/2ML IJ SOLN
INTRAMUSCULAR | Status: DC | PRN
  Administered 2018-08-08 (×2): 50 ug via INTRAVENOUS

## 2018-08-08 MED ORDER — PROPOFOL 10 MG/ML IV BOLUS
INTRAVENOUS | Status: DC | PRN
  Administered 2018-08-08: 30 mg via INTRAVENOUS
  Administered 2018-08-08: 20 mg via INTRAVENOUS

## 2018-08-08 MED ORDER — SODIUM CHLORIDE 0.9 % IV SOLN: INTRAVENOUS | Status: DC

## 2018-08-08 MED ORDER — MIDAZOLAM HCL 2 MG/2ML IJ SOLN
INTRAMUSCULAR | Status: AC
  Filled 2018-08-08: qty 2

## 2018-08-08 MED ORDER — LIDOCAINE HCL (PF) 2 % IJ SOLN
INTRAMUSCULAR | Status: AC
  Filled 2018-08-08: qty 10

## 2018-08-08 MED ORDER — SODIUM CHLORIDE 0.9 % IV SOLN
INTRAVENOUS | Status: DC
  Administered 2018-08-08: 1000 mL via INTRAVENOUS

## 2018-08-08 NOTE — Transfer of Care (Signed)
Immediate Anesthesia Transfer of Care Note  Patient: Corey West  Procedure(s) Performed: ESOPHAGOGASTRODUODENOSCOPY (EGD) WITH PROPOFOL (N/A )  Patient Location: PACU  Anesthesia Type:General  Level of Consciousness: sedated  Airway & Oxygen Therapy: Patient Spontanous Breathing  Post-op Assessment: Report given to RN and Post -op Vital signs reviewed and stable  Post vital signs: Reviewed and stable  Last Vitals:  Vitals Value Taken Time  BP 128/68 08/08/2018 11:00 AM  Temp    Pulse 75 08/08/2018 10:59 AM  Resp 14 08/08/2018 11:00 AM  SpO2 95 % 08/08/2018 10:59 AM  Vitals shown include unvalidated device data.  Last Pain:  Vitals:   08/08/18 0915  TempSrc: Tympanic  PainSc: 0-No pain         Complications: No apparent anesthesia complications

## 2018-08-08 NOTE — Anesthesia Preprocedure Evaluation (Signed)
Anesthesia Evaluation  Patient identified by MRN, date of birth, ID band Patient awake    Reviewed: Allergy & Precautions, NPO status , Patient's Chart, lab work & pertinent test results, reviewed documented beta blocker date and time   Airway Mallampati: III  TM Distance: >3 FB     Dental  (+) Chipped   Pulmonary sleep apnea , Current Smoker,           Cardiovascular hypertension, Pt. on medications      Neuro/Psych PSYCHIATRIC DISORDERS Anxiety Depression    GI/Hepatic hiatal hernia, GERD  ,  Endo/Other  diabetes, Type 2Morbid obesity  Renal/GU Renal disease     Musculoskeletal  (+) Arthritis ,   Abdominal   Peds  Hematology  (+) anemia ,   Anesthesia Other Findings EKG ok. Hb 9.9. UPPP. Dec platelets.  Reproductive/Obstetrics                             Anesthesia Physical Anesthesia Plan  ASA: III  Anesthesia Plan: General   Post-op Pain Management:    Induction: Intravenous  PONV Risk Score and Plan:   Airway Management Planned:   Additional Equipment:   Intra-op Plan:   Post-operative Plan:   Informed Consent: I have reviewed the patients History and Physical, chart, labs and discussed the procedure including the risks, benefits and alternatives for the proposed anesthesia with the patient or authorized representative who has indicated his/her understanding and acceptance.     Plan Discussed with: CRNA  Anesthesia Plan Comments:         Anesthesia Quick Evaluation

## 2018-08-08 NOTE — Anesthesia Postprocedure Evaluation (Signed)
Anesthesia Post Note  Patient: Corey West  Procedure(West) Performed: ESOPHAGOGASTRODUODENOSCOPY (EGD) WITH PROPOFOL (N/A )  Patient location during evaluation: Endoscopy Anesthesia Type: General Level of consciousness: awake and alert Pain management: pain level controlled Vital Signs Assessment: post-procedure vital signs reviewed and stable Respiratory status: spontaneous breathing, nonlabored ventilation, respiratory function stable and patient connected to nasal cannula oxygen Cardiovascular status: blood pressure returned to baseline and stable Postop Assessment: no apparent nausea or vomiting Anesthetic complications: no     Last Vitals:  Vitals:   08/08/18 1120 08/08/18 1130  BP: (!) 149/69 140/78  Pulse: 73 72  Resp: 15 16  Temp:    SpO2: 97% 100%    Last Pain:  Vitals:   08/08/18 1100  TempSrc: Tympanic  PainSc:                  Corey West

## 2018-08-08 NOTE — Anesthesia Post-op Follow-up Note (Signed)
Anesthesia QCDR form completed.        

## 2018-08-08 NOTE — H&P (Signed)
Primary Care Physician:  Baxter Hire, MD Primary Gastroenterologist:  Dr. Vira Agar  Pre-Procedure History & Physical: HPI:  Corey West is a 67 y.o. male is here for an endoscopy.   Past Medical History:  Diagnosis Date  . Anemia   . Anxiety    controlled;   . Arthritis   . AVM (arteriovenous malformation) of stomach, acquired with hemorrhage   . Barrett's esophagus   . Chronic kidney disease    renal infufficiency  . Cirrhosis ([place])   . Depression    controlled;   Marland Kitchen Diabetes mellitus without complication ([place])    not controlled, taking insulin but sugar continues to run high;   . Edema   . Esophageal varices ([place])   . GAVE (gastric antral vascular ectasia)   . GERD (gastroesophageal reflux disease)   . History of hiatal hernia   . Hyperlipidemia   . Hypertension    controlled well;   . 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 Obesity   . Pancytopenia ([place])   . Polyp, stomach    with chronic blood loss  . Sleep apnea    does not wear a cpap, Medicare would not pay for it   . Venous stasis dermatitis of both lower extremities   . Venous stasis of both lower extremities    with cellulitis    Past Surgical History:  Procedure Laterality Date  . ESOPHAGOGASTRODUODENOSCOPY N/A 02/09/2015   Procedure: ESOPHAGOGASTRODUODENOSCOPY (EGD);  Surgeon: Manya Silvas, MD;  Location: [facility] ENDOSCOPY;  Service: Endoscopy;  Laterality: N/A;  . ESOPHAGOGASTRODUODENOSCOPY N/A 07/22/2015   Procedure: ESOPHAGOGASTRODUODENOSCOPY (EGD);  Surgeon: Manya Silvas, MD;  Location: [facility] ENDOSCOPY;  Service: Endoscopy;  Laterality: N/A;  . ESOPHAGOGASTRODUODENOSCOPY N/A 06/26/2017   Procedure: ESOPHAGOGASTRODUODENOSCOPY (EGD);  Surgeon: Manya Silvas, MD;  Location: [facility] ENDOSCOPY;  Service: Endoscopy;  Laterality: N/A;  . ESOPHAGOGASTRODUODENOSCOPY N/A 06/27/2017   Procedure: ESOPHAGOGASTRODUODENOSCOPY (EGD);  Surgeon: Manya Silvas, MD;  Location: [facility] ENDOSCOPY;  Service: Endoscopy;   Laterality: N/A;  . ESOPHAGOGASTRODUODENOSCOPY N/A 06/28/2017   Procedure: ESOPHAGOGASTRODUODENOSCOPY (EGD);  Surgeon: Manya Silvas, MD;  Location: [facility] ENDOSCOPY;  Service: Endoscopy;  Laterality: N/A;  . ESOPHAGOGASTRODUODENOSCOPY Bilateral 10/11/2017   Procedure: ESOPHAGOGASTRODUODENOSCOPY (EGD);  Surgeon: Manya Silvas, MD;  Location: [facility] ENDOSCOPY;  Service: Endoscopy;  Laterality: Bilateral;  . ESOPHAGOGASTRODUODENOSCOPY N/A 12/04/2017   Procedure: ESOPHAGOGASTRODUODENOSCOPY (EGD);  Surgeon: Manya Silvas, MD;  Location: [facility] ENDOSCOPY;  Service: Endoscopy;  Laterality: N/A;  . ESOPHAGOGASTRODUODENOSCOPY (EGD) WITH PROPOFOL N/A 07/20/2015   Procedure: ESOPHAGOGASTRODUODENOSCOPY (EGD) WITH PROPOFOL;  Surgeon: Manya Silvas, MD;  Location: [facility] ENDOSCOPY;  Service: Endoscopy;  Laterality: N/A;  . ESOPHAGOGASTRODUODENOSCOPY (EGD) WITH PROPOFOL N/A 09/16/2015   Procedure: ESOPHAGOGASTRODUODENOSCOPY (EGD) WITH PROPOFOL;  Surgeon: Manya Silvas, MD;  Location: [facility] ENDOSCOPY;  Service: Endoscopy;  Laterality: N/A;  . ESOPHAGOGASTRODUODENOSCOPY (EGD) WITH PROPOFOL N/A 03/16/2016   Procedure: ESOPHAGOGASTRODUODENOSCOPY (EGD) WITH PROPOFOL;  Surgeon: Manya Silvas, MD;  Location: [facility] ENDOSCOPY;  Service: Endoscopy;  Laterality: N/A;  . ESOPHAGOGASTRODUODENOSCOPY (EGD) WITH PROPOFOL N/A 09/14/2016   Procedure: ESOPHAGOGASTRODUODENOSCOPY (EGD) WITH PROPOFOL;  Surgeon: Manya Silvas, MD;  Location: [facility] ENDOSCOPY;  Service: Endoscopy;  Laterality: N/A;  . ESOPHAGOGASTRODUODENOSCOPY (EGD) WITH PROPOFOL N/A 11/05/2016   Procedure: ESOPHAGOGASTRODUODENOSCOPY (EGD) WITH PROPOFOL;  Surgeon: Manya Silvas, MD;  Location: [facility] ENDOSCOPY;  Service: Endoscopy;  Laterality: N/A;  . ESOPHAGOGASTRODUODENOSCOPY (EGD) WITH PROPOFOL N/A 02/06/2017   Procedure: ESOPHAGOGASTRODUODENOSCOPY (EGD) WITH PROPOFOL;  Surgeon: Manya Silvas, MD;  Location: [facility] ENDOSCOPY;  Service: Endoscopy;   Laterality: N/A;  . ESOPHAGOGASTRODUODENOSCOPY (EGD) WITH PROPOFOL N/A 04/24/2017   Procedure: ESOPHAGOGASTRODUODENOSCOPY (EGD) WITH PROPOFOL;  Surgeon: Manya Silvas, MD;  Location: [facility] ENDOSCOPY;  Service: Endoscopy;  Laterality: N/A;  . ESOPHAGOGASTRODUODENOSCOPY (EGD) WITH PROPOFOL N/A 07/31/2017   Procedure: ESOPHAGOGASTRODUODENOSCOPY (EGD) WITH PROPOFOL;  Surgeon: Manya Silvas, MD;  Location: [facility] ENDOSCOPY;  Service: Endoscopy;  Laterality: N/A;  . GIVENS CAPSULE STUDY N/A 12/05/2017   Procedure: GIVENS CAPSULE STUDY;  Surgeon: Manya Silvas, MD;  Location: [facility] ENDOSCOPY;  Service: Endoscopy;  Laterality: N/A;  . TONSILLECTOMY    . TONSILLECTOMY AND ADENOIDECTOMY    . ULNAR NERVE TRANSPOSITION    . UVULOPALATOPHARYNGOPLASTY      Prior to Admission medications   Medication Sig Start Date End Date Taking? Authorizing Provider  ferrous sulfate 325 (65 FE) MG tablet Take 325 mg by mouth daily with breakfast.   Yes [provider]  docusate sodium (COLACE) 100 MG capsule Take 100 mg by mouth 2 (two) times daily.    [provider]  furosemide (LASIX) 40 MG tablet Take 40 mg by mouth daily.    [provider]  insulin aspart protamine - aspart (NOVOLOG 70/30 MIX) (70-30) 100 UNIT/ML FlexPen Inject 84 Units into the skin daily with breakfast.     [provider]  insulin aspart protamine - aspart (NOVOLOG 70/30 MIX) (70-30) 100 UNIT/ML FlexPen Inject 65 Units into the skin at bedtime.    [provider]  lactulose (CHRONULAC) 10 GM/15ML solution Take 30 mLs by mouth 2 (two) times daily.  07/04/18   [provider]  LORazepam (ATIVAN) 0.5 MG tablet Take 0.5 mg by mouth every 8 (eight) hours as needed for anxiety.     [provider]  metolazone (ZAROXOLYN) 2.5 MG tablet Take 2.5 mg by mouth daily.    [provider]  Multiple Vitamin (MULTIVITAMIN) tablet Take 1 tablet by mouth daily.    [provider]   omeprazole (PRILOSEC) 40 MG capsule Take 40 mg by mouth daily.    [provider]  Potassium Chloride ER 20 MEQ TBCR Take 40 mEq by mouth daily.     [provider]  potassium chloride West (K-DUR,KLOR-CON) 20 MEQ tablet Take 20 mEq by mouth at bedtime.    [provider]  ranitidine (ZANTAC) 150 MG capsule Take 150 mg by mouth 2 (two) times daily as needed for heartburn.     [provider]  rifaximin (XIFAXAN) 550 MG TABS tablet Take 1 tablet (550 mg total) by mouth 2 (two) times daily. 01/01/18   Loletha Grayer, MD    Allergies as of 08/04/2018  . (No Known Allergies)    Family History  Problem Relation Age of Onset  . Diabetes Other   . Transient ischemic attack Father   . CAD Father     Social History   Socioeconomic History  . Marital status: Single    Spouse name: Not on file  . Number of children: Not on file  . Years of education: Not on file  . Highest education level: Not on file  Occupational History  . Occupation: disabled  Social Needs  . Financial resource strain: Not hard at all  . Food insecurity:    Worry: Never true    Inability: Never true  . Transportation needs:    Medical: No    Non-medical: No  Tobacco Use  . Smoking status: Current Some Day  Smoker    Types: Cigars  . Smokeless tobacco: Never Used  . Tobacco comment: smoke 1 at night occasionally;   Substance and Sexual Activity  . Alcohol use: No    Comment: stopped 12 years ago  . Drug use: No  . Sexual activity: Never  Lifestyle  . Physical activity:    Days per week: 0 days    Minutes per session: 0 min  . Stress: Not at all  Relationships  . Social connections:    Talks on phone: More than three times a week    Gets together: Once a week    Attends religious service: Never    Active member of club or organization: No    Attends meetings of clubs or organizations: Never    Relationship status: Never married  . Intimate partner violence:    Fear  of current or ex partner: No    Emotionally abused: No    Physically abused: No    Forced sexual activity: No  Other Topics Concern  . Not on file  Social History Narrative   Lives alone    Review of Systems: See HPI, otherwise negative ROS  Physical Exam: BP (!) 136/53   Pulse 64   Temp 97.6 F (36.4 C) (Tympanic)   Resp 16   Ht 5\' 9"  (1.753 m)   Wt 126.5 kg   SpO2 100%   BMI 41.18 kg/m  General:   Alert,  pleasant and cooperative in NAD Head:  Normocephalic and atraumatic. Neck:  Supple; no masses or thyromegaly. Lungs:  Clear throughout to auscultation.    Heart:  Regular rate and rhythm. Abdomen:  Soft, nontender and nondistended. Normal bowel sounds, without guarding, and without rebound.   Neurologic:  Alert and  oriented x4;  grossly normal neurologically.  Impression/Plan: Corey West is here for an endoscopy to be performed for evaluation of stomach due to previous problems with recurrent bleeding and evaluate previous treatment done at [facility].  Risks, benefits, limitations, and alternatives regarding  endoscopy have been reviewed with the patient.  Questions have been answered.  All parties agreeable.   [name], MD  08/08/2018, 10:45 AM

## 2018-08-08 NOTE — Op Note (Signed)
[facility] Gastroenterology Patient Name: Corey West Procedure Date: 08/08/2018 10:43 AM MRN: [id number] Account #: [id number] Date of Birth: [birth year] Admit Type: Outpatient Age: 67 Room: [facility] ENDO ROOM 1 Gender: Male Note Status: Finalized Procedure:            Upper GI endoscopy Indications:          Iron deficiency anemia secondary to chronic blood loss Providers:            Manya Silvas, MD Referring MD:         Baxter Hire, MD (Referring MD) Medicines:            Propofol per Anesthesia Complications:        No immediate complications. Procedure:            Pre-Anesthesia Assessment:                       - After reviewing the risks and benefits, the patient                        was deemed in satisfactory condition to undergo the                        procedure.                       After obtaining informed consent, the endoscope was                        passed under direct vision. Throughout the procedure,                        the patient's blood pressure, pulse, and oxygen                        saturations were monitored continuously. The Endoscope                        was introduced through the mouth, and advanced to the                        second part of duodenum. The upper GI endoscopy was                        accomplished without difficulty. The patient tolerated                        the procedure well. Findings:      There were esophageal mucosal changes secondary to established       long-segment Barrett's disease present in the lower third of the       esophagus. The maximum longitudinal extent of these mucosal changes was       3-4 cm in length.      A few small sessile polyps with no bleeding and no stigmata of recent       bleeding were found in the gastric antrum. Not a bit of blood seen.      The examined duodenum was normal. Impression:           - Esophageal mucosal changes secondary to established             long-segment  Barrett's disease.                       - A few gastric polyps. Antrum location.                       - Normal examined duodenum.                       - No specimens collected.                       The stomach looked remarkable better than last one 4-5                        months ago. hemoglobin staying steady Recommendation:       Continue meds. avoid any NSAID, no repeat EGD unless                        acute bleeding.                       - The findings and recommendations were discussed with                        the patient's family. Manya Silvas, MD 08/08/2018 11:06:54 AM This report has been signed electronically. Number of Addenda: 0 Note Initiated On: 08/08/2018 10:43 AM      [facility]

## 2018-08-11 ENCOUNTER — Encounter: Payer: Self-pay | Admitting: Unknown Physician Specialty

## 2018-09-14 NOTE — Progress Notes (Signed)
[place]  [phone] [phone]  ID: Corey West OB: 1951-03-09  MR#: [id number]  TFT#:[id number]  Patient Care Team: Baxter Hire, MD as PCP - General (Internal Medicine)  CHIEF COMPLAINT: Iron deficiency anemia, thrombocytopenia  INTERVAL HISTORY: Patient returns to clinic today for repeat laboratory work and further evaluation.  He currently feels well and is asymptomatic.  He does not complain of weakness or fatigue today.  He has had no recent hospital admissions. He has no neurologic complaints.  He denies any fevers. He has a fair appetite, but denies weight loss.  He denies any chest pain, shortness of breath, cough, or hemoptysis. He denies any nausea, vomiting, constipation, or diarrhea.  He has no melena or hematochezia. He has no urinary complaints.  Patient offers no specific complaints today.    REVIEW OF SYSTEMS:   Review of Systems  Constitutional: Negative.  Negative for fever, malaise/fatigue and weight loss.  Respiratory: Negative.  Negative for cough, hemoptysis and shortness of breath.   Cardiovascular: Negative.  Negative for chest pain and leg swelling.  Gastrointestinal: Negative.  Negative for abdominal pain, blood in stool and melena.  Genitourinary: Negative.  Negative for hematuria.  Musculoskeletal: Negative.  Negative for back pain.  Skin: Negative.  Negative for rash.  Neurological: Negative.  Negative for sensory change, focal weakness, weakness and headaches.  Psychiatric/Behavioral: Negative.  The patient is not nervous/anxious.     As per HPI. Otherwise, a complete review of systems is negative.  PAST MEDICAL HISTORY: Past Medical History:  Diagnosis Date  . Anemia   . Anxiety    controlled;   . Arthritis   . AVM (arteriovenous malformation) of stomach, acquired with hemorrhage   . Barrett's esophagus   . Chronic kidney disease    renal infufficiency  . Cirrhosis ([place])   . Depression    controlled;   Marland Kitchen Diabetes mellitus without complication ([place])    not controlled, taking insulin but sugar continues to run high;   . Edema   . Esophageal varices ([place])   . GAVE (gastric antral vascular ectasia)   . GERD (gastroesophageal reflux disease)   . History of hiatal hernia   . Hyperlipidemia   . Hypertension    controlled well;   . 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 Obesity   . Pancytopenia ([place])   . Polyp, stomach    with chronic blood loss  . Sleep apnea    does not wear a cpap, Medicare would not pay for it   . Venous stasis dermatitis of both lower extremities   . Venous stasis of both lower extremities    with cellulitis    PAST SURGICAL HISTORY: Past Surgical History:  Procedure Laterality Date  . ESOPHAGOGASTRODUODENOSCOPY N/A 02/09/2015   Procedure: ESOPHAGOGASTRODUODENOSCOPY (EGD);  Surgeon: Manya Silvas, MD;  Location: [facility] ENDOSCOPY;  Service: Endoscopy;  Laterality: N/A;  . ESOPHAGOGASTRODUODENOSCOPY N/A 07/22/2015   Procedure: ESOPHAGOGASTRODUODENOSCOPY (EGD);  Surgeon: Manya Silvas, MD;  Location: [facility] ENDOSCOPY;  Service: Endoscopy;  Laterality: N/A;  . ESOPHAGOGASTRODUODENOSCOPY N/A 06/26/2017   Procedure: ESOPHAGOGASTRODUODENOSCOPY (EGD);  Surgeon: Manya Silvas, MD;  Location: [facility] ENDOSCOPY;  Service: Endoscopy;  Laterality: N/A;  . ESOPHAGOGASTRODUODENOSCOPY N/A 06/27/2017   Procedure: ESOPHAGOGASTRODUODENOSCOPY (EGD);  Surgeon: Manya Silvas, MD;  Location: [facility] ENDOSCOPY;  Service: Endoscopy;  Laterality: N/A;  . ESOPHAGOGASTRODUODENOSCOPY N/A 06/28/2017   Procedure: ESOPHAGOGASTRODUODENOSCOPY (EGD);  Surgeon: Manya Silvas, MD;  Location: [facility] ENDOSCOPY;  Service: Endoscopy;  Laterality:  N/A;  . ESOPHAGOGASTRODUODENOSCOPY Bilateral 10/11/2017   Procedure: ESOPHAGOGASTRODUODENOSCOPY (EGD);  Surgeon: Manya Silvas, MD;  Location: [facility] ENDOSCOPY;  Service: Endoscopy;  Laterality: Bilateral;  . ESOPHAGOGASTRODUODENOSCOPY N/A 12/04/2017    Procedure: ESOPHAGOGASTRODUODENOSCOPY (EGD);  Surgeon: Manya Silvas, MD;  Location: [facility] ENDOSCOPY;  Service: Endoscopy;  Laterality: N/A;  . ESOPHAGOGASTRODUODENOSCOPY (EGD) WITH PROPOFOL N/A 07/20/2015   Procedure: ESOPHAGOGASTRODUODENOSCOPY (EGD) WITH PROPOFOL;  Surgeon: Manya Silvas, MD;  Location: [facility] ENDOSCOPY;  Service: Endoscopy;  Laterality: N/A;  . ESOPHAGOGASTRODUODENOSCOPY (EGD) WITH PROPOFOL N/A 09/16/2015   Procedure: ESOPHAGOGASTRODUODENOSCOPY (EGD) WITH PROPOFOL;  Surgeon: Manya Silvas, MD;  Location: [facility] ENDOSCOPY;  Service: Endoscopy;  Laterality: N/A;  . ESOPHAGOGASTRODUODENOSCOPY (EGD) WITH PROPOFOL N/A 03/16/2016   Procedure: ESOPHAGOGASTRODUODENOSCOPY (EGD) WITH PROPOFOL;  Surgeon: Manya Silvas, MD;  Location: [facility] ENDOSCOPY;  Service: Endoscopy;  Laterality: N/A;  . ESOPHAGOGASTRODUODENOSCOPY (EGD) WITH PROPOFOL N/A 09/14/2016   Procedure: ESOPHAGOGASTRODUODENOSCOPY (EGD) WITH PROPOFOL;  Surgeon: Manya Silvas, MD;  Location: [facility] ENDOSCOPY;  Service: Endoscopy;  Laterality: N/A;  . ESOPHAGOGASTRODUODENOSCOPY (EGD) WITH PROPOFOL N/A 11/05/2016   Procedure: ESOPHAGOGASTRODUODENOSCOPY (EGD) WITH PROPOFOL;  Surgeon: Manya Silvas, MD;  Location: [facility] ENDOSCOPY;  Service: Endoscopy;  Laterality: N/A;  . ESOPHAGOGASTRODUODENOSCOPY (EGD) WITH PROPOFOL N/A 02/06/2017   Procedure: ESOPHAGOGASTRODUODENOSCOPY (EGD) WITH PROPOFOL;  Surgeon: Manya Silvas, MD;  Location: [facility] ENDOSCOPY;  Service: Endoscopy;  Laterality: N/A;  . ESOPHAGOGASTRODUODENOSCOPY (EGD) WITH PROPOFOL N/A 04/24/2017   Procedure: ESOPHAGOGASTRODUODENOSCOPY (EGD) WITH PROPOFOL;  Surgeon: Manya Silvas, MD;  Location: [facility] ENDOSCOPY;  Service: Endoscopy;  Laterality: N/A;  . ESOPHAGOGASTRODUODENOSCOPY (EGD) WITH PROPOFOL N/A 07/31/2017   Procedure: ESOPHAGOGASTRODUODENOSCOPY (EGD) WITH PROPOFOL;  Surgeon: Manya Silvas, MD;  Location: [facility] ENDOSCOPY;  Service: Endoscopy;  Laterality: N/A;    . ESOPHAGOGASTRODUODENOSCOPY (EGD) WITH PROPOFOL N/A 08/08/2018   Procedure: ESOPHAGOGASTRODUODENOSCOPY (EGD) WITH PROPOFOL;  Surgeon: Manya Silvas, MD;  Location: [facility] ENDOSCOPY;  Service: Endoscopy;  Laterality: N/A;  . GIVENS CAPSULE STUDY N/A 12/05/2017   Procedure: GIVENS CAPSULE STUDY;  Surgeon: Manya Silvas, MD;  Location: [facility] ENDOSCOPY;  Service: Endoscopy;  Laterality: N/A;  . TONSILLECTOMY    . TONSILLECTOMY AND ADENOIDECTOMY    . ULNAR NERVE TRANSPOSITION    . UVULOPALATOPHARYNGOPLASTY      FAMILY HISTORY: Family History  Problem Relation Age of Onset  . Diabetes Other   . Transient ischemic attack Father   . CAD Father     ADVANCED DIRECTIVES (Y/N):  N  HEALTH MAINTENANCE: Social History   Tobacco Use  . Smoking status: Current Some Day Smoker    Types: Cigars  . Smokeless tobacco: Never Used  . Tobacco comment: smoke 1 at night occasionally;   Substance Use Topics  . Alcohol use: No    Comment: stopped 12 years ago  . Drug use: No     Colonoscopy:  PAP:  Bone density:  Lipid panel:  No Known Allergies  Current Outpatient Medications  Medication Sig Dispense Refill  . docusate sodium (COLACE) 100 MG capsule Take 100 mg by mouth 2 (two) times daily.    . ferrous sulfate 325 (65 FE) MG tablet Take 325 mg by mouth daily with breakfast.    . furosemide (LASIX) 40 MG tablet Take 40 mg by mouth daily.    . insulin aspart protamine - aspart (NOVOLOG 70/30 MIX) (70-30) 100 UNIT/ML FlexPen Inject 84 Units into the skin daily with breakfast.     . insulin aspart protamine - aspart (NOVOLOG 70/30  MIX) (70-30) 100 UNIT/ML FlexPen Inject 65 Units into the skin at bedtime.    Marland Kitchen lactulose (CHRONULAC) 10 GM/15ML solution Take 30 mLs by mouth 2 (two) times daily.     Marland Kitchen LORazepam (ATIVAN) 0.5 MG tablet Take 0.5 mg by mouth every 8 (eight) hours as needed for anxiety.     . metolazone (ZAROXOLYN) 2.5 MG tablet Take 2.5 mg by mouth daily.    . Multiple Vitamin  (MULTIVITAMIN) tablet Take 1 tablet by mouth daily.    Marland Kitchen omeprazole (PRILOSEC) 40 MG capsule Take 40 mg by mouth daily.    . Potassium Chloride ER 20 MEQ TBCR Take 40 mEq by mouth daily.     . potassium chloride [place] (K-DUR,KLOR-CON) 20 MEQ tablet Take 20 mEq by mouth at bedtime.    . ranitidine (ZANTAC) 150 MG capsule Take 150 mg by mouth 2 (two) times daily as needed for heartburn.     . rifaximin (XIFAXAN) 550 MG TABS tablet Take 1 tablet (550 mg total) by mouth 2 (two) times daily. 60 tablet 0   No current facility-administered medications for this visit.     OBJECTIVE: Vitals:   09/15/18 1427  BP: (!) 192/73  Pulse: 69  Resp: 18     There is no height or weight on file to calculate BMI.    ECOG FS:0 - Asymptomatic  General: Well-developed, well-nourished, no acute distress. Eyes: Pink conjunctiva, anicteric sclera. HEENT: Normocephalic, moist mucous membranes. Lungs: Clear to auscultation bilaterally. Heart: Regular rate and rhythm. No rubs, murmurs, or gallops. Abdomen: Soft, nontender, nondistended. No organomegaly noted, normoactive bowel sounds. Musculoskeletal: No edema, cyanosis, or clubbing. Neuro: Alert, answering all questions appropriately. Cranial nerves grossly intact. Skin: No rashes or petechiae noted. Psych: Normal affect.  LAB RESULTS:  Lab Results  Component Value Date   NA 137 07/21/2018   K 3.5 07/21/2018   CL 100 07/21/2018   CO2 27 07/21/2018   GLUCOSE 217 (H) 07/21/2018   BUN 39 (H) 07/21/2018   CREATININE 1.96 (H) 07/21/2018   CALCIUM 9.3 07/21/2018   PROT 6.3 (L) 08/08/2018   ALBUMIN 3.1 (L) 08/08/2018   AST 28 08/08/2018   ALT 22 08/08/2018   ALKPHOS 77 08/08/2018   BILITOT 1.2 08/08/2018   GFRNONAA 34 (L) 07/21/2018   GFRAA 39 (L) 07/21/2018    Lab Results  Component Value Date   WBC 3.9 (L) 09/15/2018   NEUTROABS 2.8 09/15/2018   HGB 8.5 (L) 09/15/2018   HCT 24.9 (L) 09/15/2018   MCV 94.3 09/15/2018   PLT 75 (L) 09/15/2018    Lab Results  Component Value Date   IRON 87 09/15/2018   TIBC 334 09/15/2018   IRONPCTSAT 26 09/15/2018   Lab Results  Component Value Date   FERRITIN 34 09/15/2018     STUDIES: No results found.  ASSESSMENT: Iron deficiency anemia, thrombocytopenia  PLAN:    1.  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  Patient's most recent EGD was on August 08, 2018.  His hemoglobin remains significantly decreased at 8.5, but his iron stores continue to be within normal limits.  He does not require additional IV Feraheme today.  Given his mild renal insufficiency, patient may benefit from Procrit in the future.  We also discussed the possibility of a bone marrow biopsy, but patient has declined at this time.  Return to clinic in 3 months with repeat laboratory work and further evaluation.   2.  Thrombocytopenia: Patient's platelet count is 75 today which is approximately his baseline. Most likely multifactorial including secondary to his known cirrhosis.  No intervention is needed.  Consider bone marrow biopsy in the future as above.  Patient expressed understanding and was in agreement with this plan. He also understands that He can call clinic at any time with any questions, concerns, or complaints.    Lloyd Huger, MD   09/17/2018 6:50 AM

## 2018-09-15 ENCOUNTER — Encounter: Payer: Self-pay | Admitting: Oncology

## 2018-09-15 ENCOUNTER — Inpatient Hospital Stay: Payer: Medicare Other

## 2018-09-15 ENCOUNTER — Inpatient Hospital Stay: Payer: Medicare Other | Attending: Oncology

## 2018-09-15 ENCOUNTER — Inpatient Hospital Stay (HOSPITAL_BASED_OUTPATIENT_CLINIC_OR_DEPARTMENT_OTHER): Payer: Medicare Other | Admitting: Oncology

## 2018-09-15 ENCOUNTER — Other Ambulatory Visit: Payer: Self-pay

## 2018-09-15 VITALS — BP 192/73 | HR 69 | Resp 18

## 2018-09-15 DIAGNOSIS — D696 Thrombocytopenia, unspecified: Secondary | ICD-10-CM | POA: Insufficient documentation

## 2018-09-15 DIAGNOSIS — N189 Chronic kidney disease, unspecified: Secondary | ICD-10-CM | POA: Diagnosis present

## 2018-09-15 DIAGNOSIS — D631 Anemia in chronic kidney disease: Secondary | ICD-10-CM

## 2018-09-15 DIAGNOSIS — D649 Anemia, unspecified: Secondary | ICD-10-CM

## 2018-09-15 DIAGNOSIS — D500 Iron deficiency anemia secondary to blood loss (chronic): Secondary | ICD-10-CM

## 2018-09-15 DIAGNOSIS — E611 Iron deficiency: Secondary | ICD-10-CM | POA: Diagnosis not present

## 2018-09-15 DIAGNOSIS — K31811 Angiodysplasia of stomach and duodenum with bleeding: Secondary | ICD-10-CM | POA: Diagnosis not present

## 2018-09-15 DIAGNOSIS — K7460 Unspecified cirrhosis of liver: Secondary | ICD-10-CM | POA: Diagnosis not present

## 2018-09-15 LAB — CBC WITH DIFFERENTIAL/PLATELET
Abs Immature Granulocytes: 0.02 10*3/uL (ref 0.00–0.07)
BASOS ABS: 0 10*3/uL (ref 0.0–0.1)
BASOS PCT: 0 %
Eosinophils Absolute: 0 10*3/uL (ref 0.0–0.5)
Eosinophils Relative: 0 %
HCT: 24.9 % — ABNORMAL LOW (ref 39.0–52.0)
Hemoglobin: 8.5 g/dL — ABNORMAL LOW (ref 13.0–17.0)
IMMATURE GRANULOCYTES: 1 %
Lymphocytes Relative: 18 %
Lymphs Abs: 0.7 10*3/uL (ref 0.7–4.0)
MCH: 32.2 pg (ref 26.0–34.0)
MCHC: 34.1 g/dL (ref 30.0–36.0)
MCV: 94.3 fL (ref 80.0–100.0)
MONOS PCT: 9 %
Monocytes Absolute: 0.4 10*3/uL (ref 0.1–1.0)
NEUTROS ABS: 2.8 10*3/uL (ref 1.7–7.7)
NEUTROS PCT: 72 %
PLATELETS: 75 10*3/uL — AB (ref 150–400)
RBC: 2.64 MIL/uL — ABNORMAL LOW (ref 4.22–5.81)
RDW: 14.1 % (ref 11.5–15.5)
WBC: 3.9 10*3/uL — ABNORMAL LOW (ref 4.0–10.5)
nRBC: 0 % (ref 0.0–0.2)

## 2018-09-15 LAB — IRON AND TIBC
Iron: 87 ug/dL (ref 45–182)
SATURATION RATIOS: 26 % (ref 17.9–39.5)
TIBC: 334 ug/dL (ref 250–450)
UIBC: 248 ug/dL

## 2018-09-15 LAB — FERRITIN: FERRITIN: 34 ng/mL (ref 24–336)

## 2018-09-15 NOTE — Progress Notes (Signed)
Patient here today for follow up regarding anemia, denies concerns today.

## 2018-09-30 ENCOUNTER — Inpatient Hospital Stay: Payer: Medicare Other

## 2018-09-30 ENCOUNTER — Other Ambulatory Visit: Payer: Self-pay | Admitting: Oncology

## 2018-09-30 VITALS — BP 159/71 | HR 74 | Temp 97.8°F | Resp 20

## 2018-09-30 DIAGNOSIS — D500 Iron deficiency anemia secondary to blood loss (chronic): Secondary | ICD-10-CM

## 2018-09-30 DIAGNOSIS — N189 Chronic kidney disease, unspecified: Secondary | ICD-10-CM | POA: Diagnosis not present

## 2018-09-30 MED ORDER — EPOETIN ALFA 10000 UNIT/ML IJ SOLN
10000.0000 [IU] | Freq: Once | INTRAMUSCULAR | Status: AC
  Administered 2018-09-30: 10000 [IU] via SUBCUTANEOUS
  Filled 2018-09-30: qty 2

## 2018-09-30 NOTE — Progress Notes (Signed)
Labs last drawn on 09/15/18 with hemoglobin result of 8.5.  Dr. Grayland Ormond approves patient to receive Procrit injection today using the 09/15/18 labs.  Pharmacy aware.

## 2018-10-15 ENCOUNTER — Encounter: Payer: Self-pay | Admitting: Emergency Medicine

## 2018-10-15 ENCOUNTER — Other Ambulatory Visit: Payer: Self-pay

## 2018-10-15 ENCOUNTER — Inpatient Hospital Stay
Admission: EM | Admit: 2018-10-15 | Discharge: 2018-10-18 | DRG: 442 | Disposition: A | Discharge status: Skilled Nursing Facility | Payer: Medicare Other | Attending: Internal Medicine | Admitting: Internal Medicine

## 2018-10-15 ENCOUNTER — Emergency Department: Payer: Medicare Other

## 2018-10-15 DIAGNOSIS — S82842A Displaced bimalleolar fracture of left lower leg, initial encounter for closed fracture: Secondary | ICD-10-CM | POA: Diagnosis present

## 2018-10-15 DIAGNOSIS — S82891A Other fracture of right lower leg, initial encounter for closed fracture: Secondary | ICD-10-CM

## 2018-10-15 DIAGNOSIS — E876 Hypokalemia: Secondary | ICD-10-CM | POA: Diagnosis present

## 2018-10-15 DIAGNOSIS — S82841A Displaced bimalleolar fracture of right lower leg, initial encounter for closed fracture: Secondary | ICD-10-CM | POA: Diagnosis present

## 2018-10-15 DIAGNOSIS — G4730 Sleep apnea, unspecified: Secondary | ICD-10-CM | POA: Diagnosis present

## 2018-10-15 DIAGNOSIS — F17290 Nicotine dependence, other tobacco product, uncomplicated: Secondary | ICD-10-CM | POA: Diagnosis present

## 2018-10-15 DIAGNOSIS — S82899A Other fracture of unspecified lower leg, initial encounter for closed fracture: Secondary | ICD-10-CM | POA: Diagnosis present

## 2018-10-15 DIAGNOSIS — R52 Pain, unspecified: Secondary | ICD-10-CM

## 2018-10-15 DIAGNOSIS — K219 Gastro-esophageal reflux disease without esophagitis: Secondary | ICD-10-CM | POA: Diagnosis present

## 2018-10-15 DIAGNOSIS — D500 Iron deficiency anemia secondary to blood loss (chronic): Secondary | ICD-10-CM | POA: Diagnosis present

## 2018-10-15 DIAGNOSIS — D61818 Other pancytopenia: Secondary | ICD-10-CM | POA: Diagnosis present

## 2018-10-15 DIAGNOSIS — E1165 Type 2 diabetes mellitus with hyperglycemia: Secondary | ICD-10-CM | POA: Diagnosis present

## 2018-10-15 DIAGNOSIS — I130 Hypertensive heart and chronic kidney disease with heart failure and stage 1 through stage 4 chronic kidney disease, or unspecified chronic kidney disease: Secondary | ICD-10-CM | POA: Diagnosis present

## 2018-10-15 DIAGNOSIS — E669 Obesity, unspecified: Secondary | ICD-10-CM | POA: Diagnosis present

## 2018-10-15 DIAGNOSIS — S82831A Other fracture of upper and lower end of right fibula, initial encounter for closed fracture: Secondary | ICD-10-CM

## 2018-10-15 DIAGNOSIS — K31819 Angiodysplasia of stomach and duodenum without bleeding: Secondary | ICD-10-CM | POA: Diagnosis present

## 2018-10-15 DIAGNOSIS — Y92009 Unspecified place in unspecified non-institutional (private) residence as the place of occurrence of the external cause: Secondary | ICD-10-CM | POA: Diagnosis not present

## 2018-10-15 DIAGNOSIS — N183 Chronic kidney disease, stage 3 (moderate): Secondary | ICD-10-CM | POA: Diagnosis present

## 2018-10-15 DIAGNOSIS — W19XXXA Unspecified fall, initial encounter: Secondary | ICD-10-CM | POA: Diagnosis present

## 2018-10-15 DIAGNOSIS — K2270 Barrett's esophagus without dysplasia: Secondary | ICD-10-CM | POA: Diagnosis present

## 2018-10-15 DIAGNOSIS — I8510 Secondary esophageal varices without bleeding: Secondary | ICD-10-CM | POA: Diagnosis present

## 2018-10-15 DIAGNOSIS — Z66 Do not resuscitate: Secondary | ICD-10-CM | POA: Diagnosis present

## 2018-10-15 DIAGNOSIS — E1122 Type 2 diabetes mellitus with diabetic chronic kidney disease: Secondary | ICD-10-CM | POA: Diagnosis present

## 2018-10-15 DIAGNOSIS — E785 Hyperlipidemia, unspecified: Secondary | ICD-10-CM | POA: Diagnosis present

## 2018-10-15 DIAGNOSIS — K7290 Hepatic failure, unspecified without coma: Principal | ICD-10-CM | POA: Diagnosis present

## 2018-10-15 DIAGNOSIS — Z79899 Other long term (current) drug therapy: Secondary | ICD-10-CM | POA: Diagnosis not present

## 2018-10-15 DIAGNOSIS — I5030 Unspecified diastolic (congestive) heart failure: Secondary | ICD-10-CM | POA: Diagnosis not present

## 2018-10-15 DIAGNOSIS — Z794 Long term (current) use of insulin: Secondary | ICD-10-CM

## 2018-10-15 DIAGNOSIS — Z6839 Body mass index (BMI) 39.0-39.9, adult: Secondary | ICD-10-CM | POA: Diagnosis not present

## 2018-10-15 DIAGNOSIS — S82892A Other fracture of left lower leg, initial encounter for closed fracture: Secondary | ICD-10-CM

## 2018-10-15 DIAGNOSIS — K7682 Hepatic encephalopathy: Secondary | ICD-10-CM

## 2018-10-15 DIAGNOSIS — F419 Anxiety disorder, unspecified: Secondary | ICD-10-CM | POA: Diagnosis present

## 2018-10-15 DIAGNOSIS — K7460 Unspecified cirrhosis of liver: Secondary | ICD-10-CM | POA: Diagnosis present

## 2018-10-15 DIAGNOSIS — I5032 Chronic diastolic (congestive) heart failure: Secondary | ICD-10-CM | POA: Diagnosis present

## 2018-10-15 DIAGNOSIS — I872 Venous insufficiency (chronic) (peripheral): Secondary | ICD-10-CM | POA: Diagnosis present

## 2018-10-15 LAB — COMPREHENSIVE METABOLIC PANEL
ALT: 28 U/L (ref 0–44)
AST: 40 U/L (ref 15–41)
Albumin: 3.5 g/dL (ref 3.5–5.0)
Alkaline Phosphatase: 72 U/L (ref 38–126)
Anion gap: 10 (ref 5–15)
BUN: 41 mg/dL — ABNORMAL HIGH (ref 8–23)
CHLORIDE: 102 mmol/L (ref 98–111)
CO2: 24 mmol/L (ref 22–32)
Calcium: 9.3 mg/dL (ref 8.9–10.3)
Creatinine, Ser: 2.48 mg/dL — ABNORMAL HIGH (ref 0.61–1.24)
GFR calc Af Amer: 30 mL/min — ABNORMAL LOW (ref >60)
GFR calc non Af Amer: 26 mL/min — ABNORMAL LOW (ref >60)
Glucose, Bld: 334 mg/dL — ABNORMAL HIGH (ref 70–99)
Potassium: 3.8 mmol/L (ref 3.5–5.1)
Sodium: 136 mmol/L (ref 135–145)
Total Bilirubin: 1 mg/dL (ref 0.3–1.2)
Total Protein: 7.2 g/dL (ref 6.5–8.1)

## 2018-10-15 LAB — URINALYSIS, COMPLETE (UACMP) WITH MICROSCOPIC
Bacteria, UA: NONE SEEN
Bilirubin Urine: NEGATIVE
Glucose, UA: >=500 mg/dL — AB
Ketones, ur: NEGATIVE mg/dL
Leukocytes, UA: NEGATIVE
Nitrite: NEGATIVE
Protein, ur: >=300 mg/dL — AB
Specific Gravity, Urine: 1.015 (ref 1.005–1.030)
pH: 5 (ref 5.0–8.0)

## 2018-10-15 LAB — CBC WITH DIFFERENTIAL/PLATELET
Abs Immature Granulocytes: 0.17 10*3/uL — ABNORMAL HIGH (ref 0.00–0.07)
Basophils Absolute: 0 10*3/uL (ref 0.0–0.1)
Basophils Relative: 0 %
Eosinophils Absolute: 0.1 10*3/uL (ref 0.0–0.5)
Eosinophils Relative: 1 %
HCT: 29.1 % — ABNORMAL LOW (ref 39.0–52.0)
Hemoglobin: 10.2 g/dL — ABNORMAL LOW (ref 13.0–17.0)
Immature Granulocytes: 1 %
Lymphocytes Relative: 3 %
Lymphs Abs: 0.4 10*3/uL — ABNORMAL LOW (ref 0.7–4.0)
MCH: 31.3 pg (ref 26.0–34.0)
MCHC: 35.1 g/dL (ref 30.0–36.0)
MCV: 89.3 fL (ref 80.0–100.0)
Monocytes Absolute: 1 10*3/uL (ref 0.1–1.0)
Monocytes Relative: 6 %
Neutro Abs: 13.8 10*3/uL — ABNORMAL HIGH (ref 1.7–7.7)
Neutrophils Relative %: 89 %
Platelets: 82 10*3/uL — ABNORMAL LOW (ref 150–400)
RBC: 3.26 MIL/uL — ABNORMAL LOW (ref 4.22–5.81)
RDW: 13.3 % (ref 11.5–15.5)
Smear Review: NORMAL
WBC: 15.5 10*3/uL — ABNORMAL HIGH (ref 4.0–10.5)
nRBC: 0 % (ref 0.0–0.2)

## 2018-10-15 LAB — CK: Total CK: 138 U/L (ref 49–397)

## 2018-10-15 LAB — AMMONIA: Ammonia: 80 umol/L — ABNORMAL HIGH (ref 9–35)

## 2018-10-15 MED ORDER — MORPHINE SULFATE (PF) 2 MG/ML IV SOLN
1.0000 mg | INTRAVENOUS | Status: DC | PRN
  Administered 2018-10-16 – 2018-10-17 (×5): 1 mg via INTRAVENOUS
  Filled 2018-10-15 (×5): qty 1

## 2018-10-15 MED ORDER — INSULIN ASPART 100 UNIT/ML ~~LOC~~ SOLN
0.0000 [IU] | Freq: Three times a day (TID) | SUBCUTANEOUS | Status: DC
  Administered 2018-10-16 – 2018-10-17 (×4): 3 [IU] via SUBCUTANEOUS
  Administered 2018-10-17: 2 [IU] via SUBCUTANEOUS
  Administered 2018-10-17: 3 [IU] via SUBCUTANEOUS
  Administered 2018-10-18: 5 [IU] via SUBCUTANEOUS
  Administered 2018-10-18: 7 [IU] via SUBCUTANEOUS
  Filled 2018-10-15 (×8): qty 1

## 2018-10-15 MED ORDER — LACTULOSE 10 GM/15ML PO SOLN
30.0000 g | Freq: Once | ORAL | Status: AC
  Administered 2018-10-15: 30 g via ORAL
  Filled 2018-10-15: qty 60

## 2018-10-15 NOTE — ED Notes (Signed)
Pt returned from X Ray.

## 2018-10-15 NOTE — ED Notes (Signed)
Pt went to X-Ray.

## 2018-10-15 NOTE — H&P (Signed)
[facility] [place] NAME: Corey West    MR#:  [id number]  DATE OF BIRTH:  [birth year]  DATE OF ADMISSION:  10/15/2018  PRIMARY CARE PHYSICIAN: Baxter Hire, MD   REQUESTING/REFERRING PHYSICIAN : Quentin Cornwall  CHIEF COMPLAINT:   Chief Complaint  Patient presents with  . Fall  . Loss of Consciousness  . Ankle Pain    HISTORY OF PRESENT ILLNESS: Corey West  is a 68 y.o. 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 Vira Agar for his GI issues. For last few days did not feel right, and thought his ammonia would be high.  He continues to take lactulose regularly and having 2-3 good bowel movements daily. He has to walk with a walker.  While walking at home he thought he tangled his walker with something and had a fall so came to emergency room.  He was noted to have high ammonia level.  He was also noted to have bilateral medial malleolus fracture and right-sided fibula fracture at the knee.  PAST MEDICAL HISTORY:   Past Medical History:  Diagnosis Date  . Anemia   . Anxiety    controlled;   . Arthritis   . AVM (arteriovenous malformation) of stomach, acquired with hemorrhage   . Barrett's esophagus   . Chronic kidney disease    renal infufficiency  . Cirrhosis ([place])   . Depression    controlled;   Marland Kitchen Diabetes mellitus without complication ([place])    not controlled, taking insulin but sugar continues to run high;   . Edema   . Esophageal varices ([place])   . GAVE (gastric antral vascular ectasia)   . GERD (gastroesophageal reflux disease)   . History of hiatal hernia   . Hyperlipidemia   . Hypertension    controlled well;    . 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 Obesity   . Pancytopenia ([place])   . Polyp, stomach    with chronic blood loss  . Sleep apnea    does not wear a cpap, Medicare would not pay for it   . Venous stasis dermatitis of both lower extremities   . Venous stasis of both lower extremities    with cellulitis    PAST SURGICAL HISTORY:  Past Surgical History:  Procedure Laterality Date  . ESOPHAGOGASTRODUODENOSCOPY N/A 02/09/2015   Procedure: ESOPHAGOGASTRODUODENOSCOPY (EGD);  Surgeon: Manya Silvas, MD;  Location: [facility] ENDOSCOPY;  Service: Endoscopy;  Laterality: N/A;  . ESOPHAGOGASTRODUODENOSCOPY N/A 07/22/2015   Procedure: ESOPHAGOGASTRODUODENOSCOPY (EGD);  Surgeon: Manya Silvas, MD;  Location: [facility] ENDOSCOPY;  Service: Endoscopy;  Laterality: N/A;  . ESOPHAGOGASTRODUODENOSCOPY N/A 06/26/2017   Procedure: ESOPHAGOGASTRODUODENOSCOPY (EGD);  Surgeon: Manya Silvas, MD;  Location: [facility] ENDOSCOPY;  Service: Endoscopy;  Laterality: N/A;  . ESOPHAGOGASTRODUODENOSCOPY N/A 06/27/2017   Procedure: ESOPHAGOGASTRODUODENOSCOPY (EGD);  Surgeon: Manya Silvas, MD;  Location: [facility] ENDOSCOPY;  Service: Endoscopy;  Laterality: N/A;  . ESOPHAGOGASTRODUODENOSCOPY N/A 06/28/2017   Procedure: ESOPHAGOGASTRODUODENOSCOPY (EGD);  Surgeon: Manya Silvas, MD;  Location: [facility] ENDOSCOPY;  Service: Endoscopy;  Laterality: N/A;  . ESOPHAGOGASTRODUODENOSCOPY Bilateral 10/11/2017   Procedure: ESOPHAGOGASTRODUODENOSCOPY (EGD);  Surgeon: Manya Silvas, MD;  Location: [facility] ENDOSCOPY;  Service: Endoscopy;  Laterality: Bilateral;  . ESOPHAGOGASTRODUODENOSCOPY N/A 12/04/2017   Procedure:  ESOPHAGOGASTRODUODENOSCOPY (EGD);  Surgeon: Manya Silvas, MD;  Location: [facility] ENDOSCOPY;  Service: Endoscopy;  Laterality: N/A;  . ESOPHAGOGASTRODUODENOSCOPY (EGD) WITH PROPOFOL N/A 07/20/2015   Procedure: ESOPHAGOGASTRODUODENOSCOPY (EGD) WITH PROPOFOL;  Surgeon: Manya Silvas, MD;  Location: [facility] ENDOSCOPY;  Service:  Endoscopy;  Laterality: N/A;  . ESOPHAGOGASTRODUODENOSCOPY (EGD) WITH PROPOFOL N/A 09/16/2015   Procedure: ESOPHAGOGASTRODUODENOSCOPY (EGD) WITH PROPOFOL;  Surgeon: Manya Silvas, MD;  Location: [facility] ENDOSCOPY;  Service: Endoscopy;  Laterality: N/A;  . ESOPHAGOGASTRODUODENOSCOPY (EGD) WITH PROPOFOL N/A 03/16/2016   Procedure: ESOPHAGOGASTRODUODENOSCOPY (EGD) WITH PROPOFOL;  Surgeon: Manya Silvas, MD;  Location: [facility] ENDOSCOPY;  Service: Endoscopy;  Laterality: N/A;  . ESOPHAGOGASTRODUODENOSCOPY (EGD) WITH PROPOFOL N/A 09/14/2016   Procedure: ESOPHAGOGASTRODUODENOSCOPY (EGD) WITH PROPOFOL;  Surgeon: Manya Silvas, MD;  Location: [facility] ENDOSCOPY;  Service: Endoscopy;  Laterality: N/A;  . ESOPHAGOGASTRODUODENOSCOPY (EGD) WITH PROPOFOL N/A 11/05/2016   Procedure: ESOPHAGOGASTRODUODENOSCOPY (EGD) WITH PROPOFOL;  Surgeon: Manya Silvas, MD;  Location: [facility] ENDOSCOPY;  Service: Endoscopy;  Laterality: N/A;  . ESOPHAGOGASTRODUODENOSCOPY (EGD) WITH PROPOFOL N/A 02/06/2017   Procedure: ESOPHAGOGASTRODUODENOSCOPY (EGD) WITH PROPOFOL;  Surgeon: Manya Silvas, MD;  Location: [facility] ENDOSCOPY;  Service: Endoscopy;  Laterality: N/A;  . ESOPHAGOGASTRODUODENOSCOPY (EGD) WITH PROPOFOL N/A 04/24/2017   Procedure: ESOPHAGOGASTRODUODENOSCOPY (EGD) WITH PROPOFOL;  Surgeon: Manya Silvas, MD;  Location: [facility] ENDOSCOPY;  Service: Endoscopy;  Laterality: N/A;  . ESOPHAGOGASTRODUODENOSCOPY (EGD) WITH PROPOFOL N/A 07/31/2017   Procedure: ESOPHAGOGASTRODUODENOSCOPY (EGD) WITH PROPOFOL;  Surgeon: Manya Silvas, MD;  Location: [facility] ENDOSCOPY;  Service: Endoscopy;  Laterality: N/A;  . ESOPHAGOGASTRODUODENOSCOPY (EGD) WITH PROPOFOL N/A 08/08/2018   Procedure: ESOPHAGOGASTRODUODENOSCOPY (EGD) WITH PROPOFOL;  Surgeon: Manya Silvas, MD;  Location: [facility] ENDOSCOPY;  Service: Endoscopy;  Laterality: N/A;  . GIVENS CAPSULE STUDY N/A 12/05/2017   Procedure: GIVENS CAPSULE STUDY;  Surgeon: Manya Silvas, MD;   Location: [facility] ENDOSCOPY;  Service: Endoscopy;  Laterality: N/A;  . TONSILLECTOMY    . TONSILLECTOMY AND ADENOIDECTOMY    . ULNAR NERVE TRANSPOSITION    . UVULOPALATOPHARYNGOPLASTY      SOCIAL HISTORY:  Social History   Tobacco Use  . Smoking status: Current Some Day Smoker    Types: Cigars  . Smokeless tobacco: Never Used  . Tobacco comment: smoke 1 at night occasionally;   Substance Use Topics  . Alcohol use: No    Comment: stopped 12 years ago    FAMILY HISTORY:  Family History  Problem Relation Age of Onset  . Diabetes Other   . Transient ischemic attack Father   . CAD Father     DRUG ALLERGIES: No Known Allergies  REVIEW OF SYSTEMS:   CONSTITUTIONAL: No fever, fatigue or weakness.  EYES: No blurred or double vision.  EARS, NOSE, AND THROAT: No tinnitus or ear pain.  RESPIRATORY: No cough, shortness of breath, wheezing or hemoptysis.  CARDIOVASCULAR: No chest pain, orthopnea, edema.  GASTROINTESTINAL: No nausea, vomiting, diarrhea or abdominal pain.  GENITOURINARY: No dysuria, hematuria.  ENDOCRINE: No polyuria, nocturia,  HEMATOLOGY: No anemia, easy bruising or bleeding SKIN: No rash or lesion. MUSCULOSKELETAL: Both ankle joint pain, no arthritis.   NEUROLOGIC: No tingling, numbness, weakness.  PSYCHIATRY: No anxiety or depression.   MEDICATIONS AT HOME:  Prior to Admission medications   Medication Sig Start Date End Date Taking? Authorizing Provider  docusate sodium (COLACE) 100 MG capsule Take 100 mg by mouth 2 (two) times daily.    [provider]  ferrous sulfate 325 (65 FE) MG tablet Take 325  mg by mouth daily with breakfast.    [provider]  furosemide (LASIX) 40 MG tablet Take 40 mg by mouth daily.    [provider]  insulin aspart protamine - aspart (NOVOLOG 70/30 MIX) (70-30) 100 UNIT/ML FlexPen Inject 84 Units into the skin daily with breakfast.     [provider]  insulin aspart protamine - aspart (NOVOLOG  70/30 MIX) (70-30) 100 UNIT/ML FlexPen Inject 65 Units into the skin at bedtime.    [provider]  lactulose (CHRONULAC) 10 GM/15ML solution Take 30 mLs by mouth 2 (two) times daily.  07/04/18   [provider]  LORazepam (ATIVAN) 0.5 MG tablet Take 0.5 mg by mouth every 8 (eight) hours as needed for anxiety.     [provider]  metolazone (ZAROXOLYN) 2.5 MG tablet Take 2.5 mg by mouth daily.    [provider]  Multiple Vitamin (MULTIVITAMIN) tablet Take 1 tablet by mouth daily.    [provider]  omeprazole (PRILOSEC) 40 MG capsule Take 40 mg by mouth daily.    [provider]  Potassium Chloride ER 20 MEQ TBCR Take 40 mEq by mouth daily.     [provider]  potassium chloride SA (K-DUR,KLOR-CON) 20 MEQ tablet Take 20 mEq by mouth at bedtime.    [provider]  ranitidine (ZANTAC) 150 MG capsule Take 150 mg by mouth 2 (two) times daily as needed for heartburn.     [provider]  rifaximin (XIFAXAN) 550 MG TABS tablet Take 1 tablet (550 mg total) by mouth 2 (two) times daily. 01/01/18   Loletha Grayer, MD      PHYSICAL EXAMINATION:   VITAL SIGNS: Blood pressure (!) 160/62, pulse 82, temperature 97.8 F (36.6 C), temperature source Oral, resp. rate (!) 24, height 6' (1.829 m), weight 136.1 kg, SpO2 97 %.  GENERAL:  68 y.o.-year-old patient lying in the bed with no acute distress.  EYES: Pupils equal, round, reactive to light and accommodation. No scleral icterus. Extraocular muscles intact.  HEENT: Head atraumatic, normocephalic. Oropharynx and nasopharynx clear.  NECK:  Supple, no jugular venous distention. No thyroid enlargement, no tenderness.  LUNGS: Normal breath sounds bilaterally, no wheezing, rales,rhonchi or crepitation. No use of accessory muscles of respiration.  CARDIOVASCULAR: S1, S2 normal.  Systolic murmurs, no rubs, or gallops.  ABDOMEN: Soft, nontender, nondistended. Bowel sounds present.  No organomegaly or mass.  EXTREMITIES: Tender in both ankles and right knee, chronic swelling and skin changes in both lower legs and feet but does not appear to be infected.  NEUROLOGIC: Cranial nerves II through XII are intact. Muscle strength 4/5 in all extremities. Sensation intact. Gait not checked.  PSYCHIATRIC: The patient is alert and oriented x 3.  SKIN: No obvious rash, lesion, or ulcer.   LABORATORY PANEL:   CBC Recent Labs  Lab 10/15/18 2014  WBC 15.5*  HGB 10.2*  HCT 29.1*  PLT 82*  MCV 89.3  MCH 31.3  MCHC 35.1  RDW 13.3  LYMPHSABS 0.4*  MONOABS 1.0  EOSABS 0.1  BASOSABS 0.0   ------------------------------------------------------------------------------------------------------------------  Chemistries  Recent Labs  Lab 10/15/18 2014  NA 136  K 3.8  CL 102  CO2 24  GLUCOSE 334*  BUN 41*  CREATININE 2.48*  CALCIUM 9.3  AST 40  ALT 28  ALKPHOS 72  BILITOT 1.0   ------------------------------------------------------------------------------------------------------------------ estimated creatinine clearance is 41.9 mL/min (A) (by C-G formula based on SCr of 2.48 mg/dL (H)). ------------------------------------------------------------------------------------------------------------------ No results for  input(s): TSH, T4TOTAL, T3FREE, THYROIDAB in the last 72 hours.  Invalid input(s): FREET3   Coagulation profile No results for input(s): INR, PROTIME in the last 168 hours. ------------------------------------------------------------------------------------------------------------------- No results for input(s): DDIMER in the last 72 hours. -------------------------------------------------------------------------------------------------------------------  Cardiac Enzymes No results for input(s): CKMB, TROPONINI, MYOGLOBIN in the last 168 hours.  Invalid input(s):  CK ------------------------------------------------------------------------------------------------------------------ Invalid input(s): POCBNP  ---------------------------------------------------------------------------------------------------------------  Urinalysis    Component Value Date/Time   COLORURINE YELLOW (A) 10/15/2018 2014   APPEARANCEUR CLEAR (A) 10/15/2018 2014   APPEARANCEUR Clear 09/10/2014 0456   LABSPEC 1.015 10/15/2018 2014   LABSPEC 1.016 09/10/2014 0456   PHURINE 5.0 10/15/2018 2014   GLUCOSEU >=500 (A) 10/15/2018 2014   GLUCOSEU Negative 09/10/2014 0456   HGBUR MODERATE (A) 10/15/2018 2014   BILIRUBINUR NEGATIVE 10/15/2018 2014   BILIRUBINUR Negative 09/10/2014 [place] 10/15/2018 2014   PROTEINUR >=300 (A) 10/15/2018 2014   NITRITE NEGATIVE 10/15/2018 2014   LEUKOCYTESUR NEGATIVE 10/15/2018 2014   LEUKOCYTESUR Negative 09/10/2014 0456     RADIOLOGY: Dg Chest 2 View  Result Date: 10/15/2018 CLINICAL DATA:  Unwitnessed fall.  Syncope. EXAM: CHEST - 2 VIEW COMPARISON:  Radiographs of July 18, 2018. FINDINGS: The heart size and mediastinal contours are within normal limits. Both lungs are clear. The visualized skeletal structures are unremarkable. IMPRESSION: No active cardiopulmonary disease. Electronically Signed   By: Marijo Conception, M.D.   On: 10/15/2018 20:17   Dg Ankle 2 Views Left  Result Date: 10/15/2018 CLINICAL DATA:  Left ankle pain after fall. EXAM: LEFT ANKLE - 2 VIEW COMPARISON:  None. FINDINGS: Mildly displaced oblique fracture is seen involving the distal left fibula with mildly displaced medial malleolar fracture. Vascular calcifications are noted. IMPRESSION: Mildly displaced distal left fibular and medial malleolar fractures. Electronically Signed   By: Marijo Conception, M.D.   On: 10/15/2018 20:27   Dg Ankle 2 Views Right  Result Date: 10/15/2018 CLINICAL DATA:  Right ankle pain after fall. EXAM: RIGHT ANKLE - 2 VIEW  COMPARISON:  None. FINDINGS: Probable minimally displaced fracture is seen involving the medial malleolus with probable mildly displaced posterior malleolar fracture. Distal fibula is unremarkable. Vascular calcifications are noted. IMPRESSION: Probable minimally displaced medial malleolar fracture with probable mildly displaced posterior malleolar fracture. Electronically Signed   By: Marijo Conception, M.D.   On: 10/15/2018 20:26   Dg Knee Complete 4 Views Right  Result Date: 10/15/2018 CLINICAL DATA:  Right knee pain after fall. EXAM: RIGHT KNEE - COMPLETE 4+ VIEW COMPARISON:  None. FINDINGS: Minimally displaced fracture is seen involving the proximal right fibula. The femur and tibia are unremarkable. The patella appears normal. No joint effusion is noted. No significant joint space narrowing is noted. IMPRESSION: Minimally displaced proximal right fibular fracture. Electronically Signed   By: Marijo Conception, M.D.   On: 10/15/2018 20:22   Dg Hip Unilat W Or Wo Pelvis 2-3 Views Right  Result Date: 10/15/2018 CLINICAL DATA:  Right hip pain after unwitnessed fall. EXAM: DG HIP (WITH OR WITHOUT PELVIS) 2-3V RIGHT COMPARISON:  None. FINDINGS: There is no evidence of hip fracture or dislocation. There is no evidence of arthropathy or other focal bone abnormality. IMPRESSION: Negative. Electronically Signed   By: Marijo Conception, M.D.   On: 10/15/2018 20:20    EKG: Orders placed or performed in visit on 10/15/18  . EKG 12-Lead    IMPRESSION AND PLAN:  *Ankle fractures Patient had bilateral ankle fracture due to  a fall Currently pain control and orthopedics consult called in by ER physician. I will avoid anticoagulants due to low platelets and history of liver cirrhosis. Patient also have history of recurrent GI bleed and AV malformations so I would like to continue with SCDs. Patient has chronic bilateral leg swelling and cardiac murmur on exam-so I would like to get echocardiogram before  proceeding with surgery as I do not see that in his previous work-up here or outpatient.  Though patient denies any cardiac history.  *Hepatic encephalopathy Patient is currently alert and oriented but as per him he was not feeling quite right for the last few days. Ammonia is elevated, continue lactulose and monitor.  *Nonalcoholic liver cirrhosis Monitor.  *Pancytopenia Due to liver cirrhosis, monitor.  Avoid anticoagulation.  *Chronic renal failure stage III Monitor.  *Diabetes mellitus type 2 We will decrease the dose of his basal insulin and keep on sliding scale coverage.  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  *Recurrent GI bleed and AV malformations Multiple EGDs at [facility].  Currently hemoglobin is stable and patient denies dark stool. Avoid anticoagulation.  All the records are reviewed and case discussed with ED provider. Management plans discussed with the patient, family and they are in agreement.  CODE STATUS: Full code. Code Status History    Date Active Date Inactive Code Status Order ID Comments User Context   07/18/2018 2339 07/21/2018 1356 Full Code [id number]  Lance Coon, MD Inpatient   06/18/2018 1609 06/19/2018 1717 Full Code [id number]  Epifanio Lesches, MD ED   12/31/2017 1856 01/01/2018 1205 Full Code [id number]  Saundra Shelling, MD Inpatient   12/25/2017 1456 12/26/2017 1618 Full Code [id number]  Epifanio Lesches, MD ED   12/16/2017 0921 12/18/2017 1635 DNR [id number]  Max Sane, MD Inpatient   12/16/2017 0305 12/16/2017 0921 Full Code [id number]  Salary, Avel Peace, MD ED   12/02/2017 2003 12/05/2017 2000 Full Code [id number]  Saundra Shelling, MD Inpatient   11/21/2017 1948 11/23/2017 1958 Full Code [id number]  Loletha Grayer, MD ED   10/23/2017 1405 10/26/2017 1402 Full Code [id number]  Bettey Costa, MD Inpatient   10/18/2017 0058 10/19/2017 1806 Full Code [id number]  Saundra Shelling, MD Inpatient   10/09/2017 2139 10/11/2017 1806 Full Code [id number]   [name], MD Inpatient   09/03/2017 2124 09/05/2017 1552 Full Code [id number]  Nicholes Mango, MD Inpatient   06/26/2017 1119 06/28/2017 1748 Full Code [id number]  Henreitta Leber, MD Inpatient   02/21/2017 2007 02/22/2017 1803 Full Code [id number]  [name], MD Inpatient   07/20/2015 1245 07/22/2015 2217 Full Code [id number]  Nicholes Mango, MD Inpatient   02/08/2015 2331 02/11/2015 1943 Full Code [id number]  Hower, Aaron Mose, MD ED       TOTAL TIME TAKING CARE OF THIS PATIENT: 50 minutes.    [name] M.D on 10/15/2018   Between 7am to 6pm - Pager - [phone]  After 6pm go to [email or web address] - password [email or web address] [facility]  Office  [phone]  CC: Primary care physician; Baxter Hire, MD   Note: This dictation was prepared with Dragon dictation along with smaller phrase technology. Any transcriptional errors that result from this process are unintentional.

## 2018-10-15 NOTE — ED Notes (Signed)
Mallie Mussel RN attempted to give report, nurse was unavailable.

## 2018-10-15 NOTE — ED Provider Notes (Signed)
[facility] Emergency Department Provider Note    First MD Initiated Contact with Patient 10/15/18 1907     (approximate)  I have reviewed the triage vital signs and the nursing notes.   HISTORY  Chief Complaint Fall; Loss of Consciousness; and Ankle Pain    HPI Corey West is a 68 y.o. male below listed extensive past medical history presents to the ER for evaluation of weakness and fall where he states his "legs went out from under him ".   Denies any head injury.  Denies any nausea or vomiting.  Describes mild to moderate right leg pain.   Past Medical History:  Diagnosis Date  . Anemia   . Anxiety    controlled;   . Arthritis   . AVM (arteriovenous malformation) of stomach, acquired with hemorrhage   . Barrett's esophagus   . Chronic kidney disease    renal infufficiency  . Cirrhosis ([place])   . Depression    controlled;   Marland Kitchen Diabetes mellitus without complication ([place])    not controlled, taking insulin but sugar continues to run high;   . Edema   . Esophageal varices ([place])   . GAVE (gastric antral vascular ectasia)   . GERD (gastroesophageal reflux disease)   . History of hiatal hernia   . Hyperlipidemia   . Hypertension    controlled well;   . 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 Obesity   . Pancytopenia ([place])   . Polyp, stomach    with chronic blood loss  . Sleep apnea    does not wear a cpap, Medicare would not pay for it   . Venous stasis dermatitis of both lower extremities   . Venous stasis of both lower extremities    with cellulitis   Family History  Problem Relation Age of Onset  . Diabetes Other   . Transient ischemic attack Father   . CAD Father    Past Surgical History:  Procedure Laterality Date  . ESOPHAGOGASTRODUODENOSCOPY N/A 02/09/2015   Procedure: ESOPHAGOGASTRODUODENOSCOPY (EGD);  Surgeon: Manya Silvas, MD;  Location: [facility] ENDOSCOPY;  Service: Endoscopy;  Laterality: N/A;  . ESOPHAGOGASTRODUODENOSCOPY N/A  07/22/2015   Procedure: ESOPHAGOGASTRODUODENOSCOPY (EGD);  Surgeon: Manya Silvas, MD;  Location: [facility] ENDOSCOPY;  Service: Endoscopy;  Laterality: N/A;  . ESOPHAGOGASTRODUODENOSCOPY N/A 06/26/2017   Procedure: ESOPHAGOGASTRODUODENOSCOPY (EGD);  Surgeon: Manya Silvas, MD;  Location: [facility] ENDOSCOPY;  Service: Endoscopy;  Laterality: N/A;  . ESOPHAGOGASTRODUODENOSCOPY N/A 06/27/2017   Procedure: ESOPHAGOGASTRODUODENOSCOPY (EGD);  Surgeon: Manya Silvas, MD;  Location: [facility] ENDOSCOPY;  Service: Endoscopy;  Laterality: N/A;  . ESOPHAGOGASTRODUODENOSCOPY N/A 06/28/2017   Procedure: ESOPHAGOGASTRODUODENOSCOPY (EGD);  Surgeon: Manya Silvas, MD;  Location: [facility] ENDOSCOPY;  Service: Endoscopy;  Laterality: N/A;  . ESOPHAGOGASTRODUODENOSCOPY Bilateral 10/11/2017   Procedure: ESOPHAGOGASTRODUODENOSCOPY (EGD);  Surgeon: Manya Silvas, MD;  Location: [facility] ENDOSCOPY;  Service: Endoscopy;  Laterality: Bilateral;  . ESOPHAGOGASTRODUODENOSCOPY N/A 12/04/2017   Procedure: ESOPHAGOGASTRODUODENOSCOPY (EGD);  Surgeon: Manya Silvas, MD;  Location: [facility] ENDOSCOPY;  Service: Endoscopy;  Laterality: N/A;  . ESOPHAGOGASTRODUODENOSCOPY (EGD) WITH PROPOFOL N/A 07/20/2015   Procedure: ESOPHAGOGASTRODUODENOSCOPY (EGD) WITH PROPOFOL;  Surgeon: Manya Silvas, MD;  Location: [facility] ENDOSCOPY;  Service: Endoscopy;  Laterality: N/A;  . ESOPHAGOGASTRODUODENOSCOPY (EGD) WITH PROPOFOL N/A 09/16/2015   Procedure: ESOPHAGOGASTRODUODENOSCOPY (EGD) WITH PROPOFOL;  Surgeon: Manya Silvas, MD;  Location: [facility] ENDOSCOPY;  Service: Endoscopy;  Laterality: N/A;  . ESOPHAGOGASTRODUODENOSCOPY (EGD) WITH PROPOFOL N/A 03/16/2016   Procedure: ESOPHAGOGASTRODUODENOSCOPY (EGD)  WITH PROPOFOL;  Surgeon: Manya Silvas, MD;  Location: [facility] ENDOSCOPY;  Service: Endoscopy;  Laterality: N/A;  . ESOPHAGOGASTRODUODENOSCOPY (EGD) WITH PROPOFOL N/A 09/14/2016   Procedure: ESOPHAGOGASTRODUODENOSCOPY (EGD) WITH PROPOFOL;  Surgeon: Manya Silvas, MD;  Location: [facility] ENDOSCOPY;  Service: Endoscopy;  Laterality: N/A;  . ESOPHAGOGASTRODUODENOSCOPY (EGD) WITH PROPOFOL N/A 11/05/2016   Procedure: ESOPHAGOGASTRODUODENOSCOPY (EGD) WITH PROPOFOL;  Surgeon: Manya Silvas, MD;  Location: [facility] ENDOSCOPY;  Service: Endoscopy;  Laterality: N/A;  . ESOPHAGOGASTRODUODENOSCOPY (EGD) WITH PROPOFOL N/A 02/06/2017   Procedure: ESOPHAGOGASTRODUODENOSCOPY (EGD) WITH PROPOFOL;  Surgeon: Manya Silvas, MD;  Location: [facility] ENDOSCOPY;  Service: Endoscopy;  Laterality: N/A;  . ESOPHAGOGASTRODUODENOSCOPY (EGD) WITH PROPOFOL N/A 04/24/2017   Procedure: ESOPHAGOGASTRODUODENOSCOPY (EGD) WITH PROPOFOL;  Surgeon: Manya Silvas, MD;  Location: [facility] ENDOSCOPY;  Service: Endoscopy;  Laterality: N/A;  . ESOPHAGOGASTRODUODENOSCOPY (EGD) WITH PROPOFOL N/A 07/31/2017   Procedure: ESOPHAGOGASTRODUODENOSCOPY (EGD) WITH PROPOFOL;  Surgeon: Manya Silvas, MD;  Location: [facility] ENDOSCOPY;  Service: Endoscopy;  Laterality: N/A;  . ESOPHAGOGASTRODUODENOSCOPY (EGD) WITH PROPOFOL N/A 08/08/2018   Procedure: ESOPHAGOGASTRODUODENOSCOPY (EGD) WITH PROPOFOL;  Surgeon: Manya Silvas, MD;  Location: [facility] ENDOSCOPY;  Service: Endoscopy;  Laterality: N/A;  . GIVENS CAPSULE STUDY N/A 12/05/2017   Procedure: GIVENS CAPSULE STUDY;  Surgeon: Manya Silvas, MD;  Location: [facility] ENDOSCOPY;  Service: Endoscopy;  Laterality: N/A;  . TONSILLECTOMY    . TONSILLECTOMY AND ADENOIDECTOMY    . ULNAR NERVE TRANSPOSITION    . UVULOPALATOPHARYNGOPLASTY     Patient Active Problem List   Diagnosis Date Noted  . Ankle fracture, left, closed, initial encounter 10/15/2018  . Ankle fracture, right, closed, initial encounter 10/15/2018  . Ankle fracture 10/15/2018  . Pressure injury of skin 07/19/2018  . UTI (urinary tract infection) 07/18/2018  . Goals of care, counseling/discussion   . Palliative care encounter   . 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 Hepatic  encephalopathy ([place]) 10/18/2017  . Hypokalemia 10/09/2017  . Hyperglycemia 10/09/2017  . Symptomatic anemia 06/26/2017  . Sepsis ([place]) 06/12/2017  . Cellulitis in diabetic foot ([place]) 02/21/2017  . Uncontrolled diabetes mellitus ([place]) 02/21/2017  . Cellulitis 02/21/2017  . GI bleed 07/20/2015  . Hematemesis 02/08/2015  . Cirrhosis ([place]) 02/08/2015  . Esophageal varices ([place]) 02/08/2015  . Type 2 diabetes mellitus ([place]) 02/08/2015  . Gastroesophageal reflux disease 02/08/2015  . Hyperlipidemia 02/08/2015  . Iron deficiency anemia 12/04/2013  . Thrombocytopenia ([place]) 12/04/2013  . Splenomegaly 11/04/2013      Prior to Admission medications   Medication Sig Start Date End Date Taking? Authorizing Provider  docusate sodium (COLACE) 100 MG capsule Take 100 mg by mouth 2 (two) times daily.    [provider]  ferrous sulfate 325 (65 FE) MG tablet Take 325 mg by mouth daily with breakfast.    [provider]  furosemide (LASIX) 40 MG tablet Take 40 mg by mouth daily.    [provider]  insulin aspart protamine - aspart (NOVOLOG 70/30 MIX) (70-30) 100 UNIT/ML FlexPen Inject 84 Units into the skin daily with breakfast.     [provider]  insulin aspart protamine - aspart (NOVOLOG 70/30 MIX) (70-30) 100 UNIT/ML FlexPen Inject 65 Units into the skin at bedtime.    [provider]  lactulose (CHRONULAC) 10 GM/15ML solution Take 30 mLs by mouth 2 (two) times daily.  07/04/18   [provider]  LORazepam (ATIVAN) 0.5 MG tablet Take 0.5 mg by mouth every 8 (eight) hours as needed  for anxiety.     [provider]  metolazone (ZAROXOLYN) 2.5 MG tablet Take 2.5 mg by mouth daily.    [provider]  Multiple Vitamin (MULTIVITAMIN) tablet Take 1 tablet by mouth daily.    [provider]  omeprazole (PRILOSEC) 40 MG capsule Take 40 mg by mouth daily.    [provider]  Potassium Chloride ER 20 MEQ TBCR Take 40  mEq by mouth daily.     [provider]  potassium chloride SA (K-DUR,KLOR-CON) 20 MEQ tablet Take 20 mEq by mouth at bedtime.    [provider]  ranitidine (ZANTAC) 150 MG capsule Take 150 mg by mouth 2 (two) times daily as needed for heartburn.     [provider]  rifaximin (XIFAXAN) 550 MG TABS tablet Take 1 tablet (550 mg total) by mouth 2 (two) times daily. 01/01/18   Loletha Grayer, MD    Allergies Patient has no known allergies.    Social History Social History   Tobacco Use  . Smoking status: Current Some Day Smoker    Types: Cigars  . Smokeless tobacco: Never Used  . Tobacco comment: smoke 1 at night occasionally;   Substance Use Topics  . Alcohol use: No    Comment: stopped 12 years ago  . Drug use: No    Review of Systems Patient denies headaches, rhinorrhea, blurry vision, numbness, shortness of breath, chest pain, edema, cough, abdominal pain, nausea, vomiting, diarrhea, dysuria, fevers, rashes or hallucinations unless otherwise stated above in HPI. ____________________________________________   PHYSICAL EXAM:  VITAL SIGNS: Vitals:   10/15/18 2230 10/15/18 2300  BP: (!) 159/66 (!) 160/62  Pulse: 85 82  Resp: (!) 22 (!) 24  Temp:    SpO2: 97% 97%    Constitutional: Alert but very dry appearing and drowsy  Eyes: Conjunctivae are normal.  Head: Atraumatic. Nose: No congestion/rhinnorhea. Mouth/Throat: Mucous membranes are moist.   Neck: No stridor. Painless ROM.  Cardiovascular: Normal rate, regular rhythm. Grossly normal heart sounds.  Good peripheral circulation. Respiratory: Normal respiratory effort.  No retractions. Lungs CTAB. Gastrointestinal: Soft and nontender. No distention. No abdominal bruits. No CVA tenderness. Genitourinary:  Musculoskeletal: BLE edema.  ttp of right hip and leg without obvious deformity No joint effusions. Neurologic:  Normal speech and language. No gross focal neurologic deficits are  appreciated. No facial droop Skin:  Skin is warm, dry and intact. No rash noted. Psychiatric: Mood and affect are normal. Speech and behavior are normal.  ____________________________________________   LABS (all labs ordered are listed, but only abnormal results are displayed)  Results for orders placed or performed during the hospital encounter of 10/15/18 (from the past 24 hour(s))  CBC with Differential/Platelet     Status: Abnormal   Collection Time: 10/15/18  8:14 PM  Result Value Ref Range   WBC 15.5 (H) 4.0 - 10.5 K/uL   RBC 3.26 (L) 4.22 - 5.81 MIL/uL   Hemoglobin 10.2 (L) 13.0 - 17.0 g/dL   HCT 29.1 (L) 39.0 - 52.0 %   MCV 89.3 80.0 - 100.0 fL   MCH 31.3 26.0 - 34.0 pg   MCHC 35.1 30.0 - 36.0 g/dL   RDW 13.3 11.5 - 15.5 %   Platelets 82 (L) 150 - 400 K/uL   nRBC 0.0 0.0 - 0.2 %   Neutrophils Relative % 89 %   Neutro Abs 13.8 (H) 1.7 - 7.7 K/uL   Lymphocytes Relative 3 %   Lymphs Abs 0.4 (L) 0.7 -  4.0 K/uL   Monocytes Relative 6 %   Monocytes Absolute 1.0 0.1 - 1.0 K/uL   Eosinophils Relative 1 %   Eosinophils Absolute 0.1 0.0 - 0.5 K/uL   Basophils Relative 0 %   Basophils Absolute 0.0 0.0 - 0.1 K/uL   WBC Morphology MORPHOLOGY UNREMARKABLE    RBC Morphology MORPHOLOGY UNREMARKABLE    Smear Review Normal platelet morphology    Immature Granulocytes 1 %   Abs Immature Granulocytes 0.17 (H) 0.00 - 0.07 K/uL  Comprehensive metabolic panel     Status: Abnormal   Collection Time: 10/15/18  8:14 PM  Result Value Ref Range   Sodium 136 135 - 145 mmol/L   Potassium 3.8 3.5 - 5.1 mmol/L   Chloride 102 98 - 111 mmol/L   CO2 24 22 - 32 mmol/L   Glucose, Bld 334 (H) 70 - 99 mg/dL   BUN 41 (H) 8 - 23 mg/dL   Creatinine, Ser 2.48 (H) 0.61 - 1.24 mg/dL   Calcium 9.3 8.9 - 10.3 mg/dL   Total Protein 7.2 6.5 - 8.1 g/dL   Albumin 3.5 3.5 - 5.0 g/dL   AST 40 15 - 41 U/L   ALT 28 0 - 44 U/L   Alkaline Phosphatase 72 38 - 126 U/L   Total Bilirubin 1.0 0.3 - 1.2 mg/dL   GFR  calc non Af Amer 26 (L) >60 mL/min   GFR calc Af Amer 30 (L) >60 mL/min   Anion gap 10 5 - 15  Ammonia     Status: Abnormal   Collection Time: 10/15/18  8:14 PM  Result Value Ref Range   Ammonia 80 (H) 9 - 35 umol/L  Urinalysis, Complete w Microscopic     Status: Abnormal   Collection Time: 10/15/18  8:14 PM  Result Value Ref Range   Color, Urine YELLOW (A) YELLOW   APPearance CLEAR (A) CLEAR   Specific Gravity, Urine 1.015 1.005 - 1.030   pH 5.0 5.0 - 8.0   Glucose, UA >=500 (A) NEGATIVE mg/dL   Hgb urine dipstick MODERATE (A) NEGATIVE   Bilirubin Urine NEGATIVE NEGATIVE   Ketones, ur NEGATIVE NEGATIVE mg/dL   Protein, ur >=300 (A) NEGATIVE mg/dL   Nitrite NEGATIVE NEGATIVE   Leukocytes, UA NEGATIVE NEGATIVE   RBC / HPF 6-10 0 - 5 RBC/hpf   WBC, UA 0-5 0 - 5 WBC/hpf   Bacteria, UA NONE SEEN NONE SEEN   Squamous Epithelial / LPF 0-5 0 - 5   Hyaline Casts, UA PRESENT   CK     Status: None   Collection Time: 10/15/18  8:14 PM  Result Value Ref Range   Total CK 138 49 - 397 U/L   ____________________________________________  EKG My review and personal interpretation at Time: 19:07   Indication: fall  Rate: 90  Rhythm: sinus Axis: normal Other: normal intervals, no stemi ____________________________________________  RADIOLOGY  I personally reviewed all radiographic images ordered to evaluate for the above acute complaints and reviewed radiology reports and findings.  These findings were personally discussed with the patient.  Please see medical record for radiology report.  ____________________________________________   PROCEDURES  Procedure(s) performed:  .Critical Care Performed by: Merlyn Lot, MD Authorized by: Merlyn Lot, MD   Critical care provider statement:    Critical care time (minutes):  35   Critical care time was exclusive of:  Separately billable procedures and treating other patients   Critical care was necessary to treat or prevent  imminent or  life-threatening deterioration of the following conditions:  CNS failure or compromise   Critical care was time spent personally by me on the following activities:  Development of treatment plan with patient or surrogate, discussions with consultants, evaluation of patient's response to treatment, examination of patient, obtaining history from patient or surrogate, ordering and performing treatments and interventions, ordering and review of laboratory studies, ordering and review of radiographic studies, pulse oximetry, re-evaluation of patient's condition and review of old charts      Critical Care performed: yes ____________________________________________   INITIAL IMPRESSION / ASSESSMENT AND PLAN / ED COURSE  Pertinent labs & imaging results that were available during my care of the patient were reviewed by me and considered in my medical decision making (see chart for details).   DDX: Dehydration, sepsis, pna, uti, hypoglycemia, cva, drug effect, fracture, contusion   Corey West is a 68 y.o. who presents to the ED with sx as described above.  Patient is AFVSS in ED. Exam as above. Given current presentation have considered the above differential.  The patient will be placed on continuous pulse oximetry and telemetry for monitoring.  Laboratory evaluation will be sent to evaluate for the above complaints.     Clinical Course as of Oct 15 2354  Wed Oct 15, 2018  2055 Patient with evidence of bilateral ankle fractures as well as proximal fibular fracture.  No evidence of hip fracture.  Still awaiting blood work.   [PR]  2059 Discussed case with Dr. Marry Guan who recommends splinting bilateral lower extremities.  Patient will require admission the hospital.   [PR]    Clinical Course User Index [PR] Merlyn Lot, MD     As part of my medical decision making, I reviewed the following data within the [place] notes reviewed and incorporated,  Labs reviewed, notes from prior ED visits and [place] Controlled Substance Database   ____________________________________________   FINAL CLINICAL IMPRESSION(S) / ED DIAGNOSES  Final diagnoses:  Hepatic encephalopathy ([place])  Closed bimalleolar fracture of left ankle, initial encounter  Closed bimalleolar fracture of right ankle, initial encounter  Closed fracture of proximal end of right fibula, unspecified fracture morphology, initial encounter      NEW MEDICATIONS STARTED DURING THIS VISIT:  New Prescriptions   No medications on file     Note:  This document was prepared using Dragon voice recognition software and may include unintentional dictation errors.    Merlyn Lot, MD 10/15/18 2356

## 2018-10-15 NOTE — ED Triage Notes (Signed)
Pt arrived to the ED via EMS from home for complaints of having a syncopal episode about 8 hrs ago and stayed in the floor until his neighbors called EMS after hearing him calling for help. Pt is somewhat confused and a poor historian. Pt is AOx4 upon arrival, unable to recall entirely what happened prior to arrival with a history of elevated ammonia levels.

## 2018-10-16 ENCOUNTER — Inpatient Hospital Stay (HOSPITAL_COMMUNITY)
Admit: 2018-10-16 | Discharge: 2018-10-16 | Disposition: A | Discharge status: Home / Self Care | Payer: Medicare Other | Attending: Internal Medicine | Admitting: Internal Medicine

## 2018-10-16 ENCOUNTER — Other Ambulatory Visit: Payer: Self-pay

## 2018-10-16 ENCOUNTER — Encounter: Payer: Self-pay | Admitting: *Deleted

## 2018-10-16 DIAGNOSIS — I5030 Unspecified diastolic (congestive) heart failure: Secondary | ICD-10-CM

## 2018-10-16 LAB — BASIC METABOLIC PANEL
Anion gap: 8 (ref 5–15)
BUN: 42 mg/dL — ABNORMAL HIGH (ref 8–23)
CO2: 25 mmol/L (ref 22–32)
Calcium: 9.1 mg/dL (ref 8.9–10.3)
Chloride: 105 mmol/L (ref 98–111)
Creatinine, Ser: 2.27 mg/dL — ABNORMAL HIGH (ref 0.61–1.24)
GFR calc Af Amer: 34 mL/min — ABNORMAL LOW (ref >60)
GFR, EST NON AFRICAN AMERICAN: 29 mL/min — AB (ref >60)
Glucose, Bld: 311 mg/dL — ABNORMAL HIGH (ref 70–99)
Potassium: 3.2 mmol/L — ABNORMAL LOW (ref 3.5–5.1)
Sodium: 138 mmol/L (ref 135–145)

## 2018-10-16 LAB — ECHOCARDIOGRAM COMPLETE
Height: 72 in
Weight: 4608 oz

## 2018-10-16 LAB — CBC
HCT: 26.9 % — ABNORMAL LOW (ref 39.0–52.0)
Hemoglobin: 9.3 g/dL — ABNORMAL LOW (ref 13.0–17.0)
MCH: 31.1 pg (ref 26.0–34.0)
MCHC: 34.6 g/dL (ref 30.0–36.0)
MCV: 90 fL (ref 80.0–100.0)
Platelets: 75 10*3/uL — ABNORMAL LOW (ref 150–400)
RBC: 2.99 MIL/uL — ABNORMAL LOW (ref 4.22–5.81)
RDW: 13.4 % (ref 11.5–15.5)
WBC: 9.8 10*3/uL (ref 4.0–10.5)
nRBC: 0 % (ref 0.0–0.2)

## 2018-10-16 LAB — AMMONIA: Ammonia: 30 umol/L (ref 9–35)

## 2018-10-16 LAB — GLUCOSE, CAPILLARY
Glucose-Capillary: 336 mg/dL — ABNORMAL HIGH (ref 70–99)
Glucose-Capillary: 249 mg/dL — ABNORMAL HIGH (ref 70–99)
Glucose-Capillary: 248 mg/dL — ABNORMAL HIGH (ref 70–99)
Glucose-Capillary: 210 mg/dL — ABNORMAL HIGH (ref 70–99)
Glucose-Capillary: 168 mg/dL — ABNORMAL HIGH (ref 70–99)

## 2018-10-16 LAB — HEMOGLOBIN A1C
Hgb A1c MFr Bld: 9.1 % — ABNORMAL HIGH (ref 4.8–5.6)
Mean Plasma Glucose: 214.47 mg/dL

## 2018-10-16 MED ORDER — FERROUS SULFATE 325 (65 FE) MG PO TABS
325.0000 mg | ORAL_TABLET | Freq: Every day | ORAL | Status: DC
  Administered 2018-10-16 – 2018-10-18 (×3): 325 mg via ORAL
  Filled 2018-10-16 (×3): qty 1

## 2018-10-16 MED ORDER — FUROSEMIDE 40 MG PO TABS
40.0000 mg | ORAL_TABLET | Freq: Every day | ORAL | Status: DC
  Administered 2018-10-16 – 2018-10-18 (×3): 40 mg via ORAL
  Filled 2018-10-16 (×3): qty 1

## 2018-10-16 MED ORDER — FAMOTIDINE 20 MG PO TABS
10.0000 mg | ORAL_TABLET | Freq: Two times a day (BID) | ORAL | Status: DC
  Administered 2018-10-16 – 2018-10-18 (×6): 10 mg via ORAL
  Filled 2018-10-16 (×6): qty 1

## 2018-10-16 MED ORDER — INSULIN ASPART PROT & ASPART (70-30 MIX) 100 UNIT/ML ~~LOC~~ SUSP
40.0000 [IU] | Freq: Every day | SUBCUTANEOUS | Status: DC
  Administered 2018-10-16: 40 [IU] via SUBCUTANEOUS
  Filled 2018-10-16: qty 10

## 2018-10-16 MED ORDER — POTASSIUM CHLORIDE CRYS ER 20 MEQ PO TBCR
40.0000 meq | EXTENDED_RELEASE_TABLET | Freq: Every day | ORAL | Status: DC
  Administered 2018-10-16 – 2018-10-18 (×3): 40 meq via ORAL
  Filled 2018-10-16 (×3): qty 2

## 2018-10-16 MED ORDER — DOCUSATE SODIUM 100 MG PO CAPS
100.0000 mg | ORAL_CAPSULE | Freq: Two times a day (BID) | ORAL | Status: DC
  Administered 2018-10-16 – 2018-10-18 (×4): 100 mg via ORAL
  Filled 2018-10-16 (×5): qty 1

## 2018-10-16 MED ORDER — INSULIN ASPART PROT & ASPART (70-30 MIX) 100 UNIT/ML ~~LOC~~ SUSP
50.0000 [IU] | Freq: Every day | SUBCUTANEOUS | Status: DC
  Administered 2018-10-16: 50 [IU] via SUBCUTANEOUS
  Filled 2018-10-16: qty 10

## 2018-10-16 MED ORDER — DOCUSATE SODIUM 100 MG PO CAPS: 100.0000 mg | ORAL_CAPSULE | Freq: Two times a day (BID) | ORAL | Status: DC | PRN

## 2018-10-16 MED ORDER — PANTOPRAZOLE SODIUM 40 MG PO TBEC
40.0000 mg | DELAYED_RELEASE_TABLET | Freq: Every day | ORAL | Status: DC
  Administered 2018-10-16 – 2018-10-18 (×3): 40 mg via ORAL
  Filled 2018-10-16 (×3): qty 1

## 2018-10-16 MED ORDER — LORAZEPAM 0.5 MG PO TABS: 0.5000 mg | ORAL_TABLET | Freq: Three times a day (TID) | ORAL | Status: DC | PRN

## 2018-10-16 MED ORDER — ADULT MULTIVITAMIN W/MINERALS CH
1.0000 | ORAL_TABLET | Freq: Every day | ORAL | Status: DC
  Administered 2018-10-16 – 2018-10-18 (×3): 1 via ORAL
  Filled 2018-10-16 (×3): qty 1

## 2018-10-16 MED ORDER — LACTULOSE 10 GM/15ML PO SOLN
20.0000 g | Freq: Two times a day (BID) | ORAL | Status: DC
  Administered 2018-10-16 – 2018-10-18 (×6): 20 g via ORAL
  Filled 2018-10-16 (×6): qty 30

## 2018-10-16 MED ORDER — RIFAXIMIN 550 MG PO TABS
550.0000 mg | ORAL_TABLET | Freq: Two times a day (BID) | ORAL | Status: DC
  Administered 2018-10-16 – 2018-10-18 (×5): 550 mg via ORAL
  Filled 2018-10-16 (×5): qty 1

## 2018-10-16 MED ORDER — INSULIN ASPART PROT & ASPART (70-30 MIX) 100 UNIT/ML ~~LOC~~ SUSP
50.0000 [IU] | Freq: Two times a day (BID) | SUBCUTANEOUS | Status: DC
  Administered 2018-10-16 – 2018-10-18 (×4): 50 [IU] via SUBCUTANEOUS
  Filled 2018-10-16 (×4): qty 10

## 2018-10-16 MED ORDER — METOLAZONE 2.5 MG PO TABS
2.5000 mg | ORAL_TABLET | Freq: Every day | ORAL | Status: DC
  Administered 2018-10-16 – 2018-10-18 (×3): 2.5 mg via ORAL
  Filled 2018-10-16 (×3): qty 1

## 2018-10-16 NOTE — Progress Notes (Signed)
Family Meeting Note  Advance Directive:yes  Today a meeting took place with the Patient.  The following clinical team members were present during this meeting:MD  The following were discussed:Patient's diagnosis: Bilateral ankle fracture, diabetes, chronic leg edema, heart murmur, nonalcoholic liver cirrhosis, recurrent GI bleed and AV malformations, thrombocytopenia, Patient's progosis: Unable to determine and Goals for treatment: Full Code  Additional follow-up to be provided: Orthopedic.  Time spent during discussion:20 minutes  [name], MD

## 2018-10-16 NOTE — Clinical Social Work Note (Signed)
Clinical Social Work Assessment  Patient Details  Name: ROXANNE ORNER MRN: [id number] Date of Birth: [birth year]  Date of referral:  10/16/18               Reason for consult:  Facility Placement                Permission sought to share information with:  Chartered certified accountant granted to share information::  Yes, Verbal Permission Granted  Name::      [place]::   Brooke   Relationship::     Contact Information:     Housing/Transportation Living arrangements for the past 2 months:  [place] of Information:  Patient Patient Interpreter Needed:  None Criminal Activity/Legal Involvement Pertinent to Current Situation/Hospitalization:  No - Comment as needed Significant Relationships:  Neighbor, Other(Comment)(Niece ) Lives with:  Self Do you feel safe going back to the place where you live?  Yes Need for family participation in patient care:  Yes (Comment)  Care giving concerns: Patient lives alone in an apartment in [place].    Social Worker assessment / plan:  Holiday representative (CSW) received SNF consult. PT is pending. Patient has bilateral ankle fractures from a fall. Per RN patient was on the floor of his apartment for 8 hours before his neighbor checked on him. PT is pending. CSW met with patient alone at bedside to discuss D/C plan. Patient reported that he lives alone and his niece calls him every night to check on him. Patient also reported that he has a neighbor that checks on him. CSW made an Adult Scientist, forensic (APS) report to [facility] because he was on the floor for 8 hours with no help.  CSW also discussed SNF placement options. Per patient he will have to go to SNF because he doesn't have anybody to take care of him at home and he can't walk. CSW explained that medicare requires a 3 night qualifying inpatient stay in a hospital in order to pay for SNF. Patient was admitted to inpatient  10/15/18. Patient verbalized his understanding and is agreeable to SNF search in [place]. Per patient he was at [facility] 1 year ago. FL2 complete and faxed out. CSW will continue to follow and assist as needed.   Employment status:  Disabled (Comment on whether or not currently receiving Disability), Retired Forensic scientist:  Medicare PT Recommendations:  Not assessed at this time Information / Referral to community resources:  [place]  Patient/Family's Response to care:  Patient is agreeable to [facility] in [place].   Patient/Family's Understanding of and Emotional Response to Diagnosis, Current Treatment, and Prognosis:  Patient was very pleasant and thanked CSW for assistance.   Emotional Assessment Appearance:  Appears stated age Attitude/Demeanor/Rapport:    Affect (typically observed):  Accepting, Adaptable, Pleasant Orientation:  Oriented to Self, Oriented to Place, Oriented to  Time, Oriented to Situation Alcohol / Substance use:  Not Applicable Psych involvement (Current and /or in the community):  No (Comment)  Discharge Needs  Concerns to be addressed:  Discharge Planning Concerns Readmission within the last 30 days:  No Current discharge risk:  Dependent with Mobility Barriers to Discharge:  Continued Medical Work up   [employer], Veronia Beets, LCSW 10/16/2018, 3:38 PM

## 2018-10-16 NOTE — Plan of Care (Signed)
  Problem: Pain Managment: Goal: General experience of comfort will improve Outcome: Progressing   

## 2018-10-16 NOTE — Consult Note (Signed)
ORTHOPAEDIC CONSULTATION  PATIENT NAME: Corey West DOB: 03-25-1951  MRN: [id number]  REQUESTING PHYSICIAN: Gladstone Lighter, MD  Chief Complaint: Bilateral ankle pain  HPI: Corey West is a 68 y.o. male who complains of bilateral ankle pain.  The patient apparently fell while at home yesterday.  He is uncertain of the exact mechanism of injury.  He was apparently down for a significant period of time.  He has felt "different" over the last several days and was concerned that his ammonia level may be elevated.  He had difficulty with weightbearing to both lower extremities due to ankle pain.  He denied any other injuries.  Radiographs obtained in the Emergency Department demonstrated a left bimalleolar ankle fracture and a right posterior malleolar fracture.  Past Medical History:  Diagnosis Date  . Anemia   . Anxiety    controlled;   . Arthritis   . AVM (arteriovenous malformation) of stomach, acquired with hemorrhage   . Barrett's esophagus   . Chronic kidney disease    renal infufficiency  . Cirrhosis ([place])   . Depression    controlled;   Marland Kitchen Diabetes mellitus without complication ([place])    not controlled, taking insulin but sugar continues to run high;   . Edema   . Esophageal varices ([place])   . GAVE (gastric antral vascular ectasia)   . GERD (gastroesophageal reflux disease)   . History of hiatal hernia   . Hyperlipidemia   . Hypertension    controlled well;   . 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 Obesity   . Pancytopenia ([place])   . Polyp, stomach    with chronic blood loss  . Sleep apnea    does not wear a cpap, Medicare would not pay for it   . Venous stasis dermatitis of both lower extremities   . Venous stasis of both lower extremities    with cellulitis   Past Surgical History:  Procedure Laterality Date  . ESOPHAGOGASTRODUODENOSCOPY N/A 02/09/2015   Procedure: ESOPHAGOGASTRODUODENOSCOPY (EGD);  Surgeon: Manya Silvas, MD;  Location: [facility] ENDOSCOPY;  Service:  Endoscopy;  Laterality: N/A;  . ESOPHAGOGASTRODUODENOSCOPY N/A 07/22/2015   Procedure: ESOPHAGOGASTRODUODENOSCOPY (EGD);  Surgeon: Manya Silvas, MD;  [facility] END[facility]COPY;  Service: Endoscopy;  Laterality: N/A;  . ESOPHAGOGASTRODUODENOSCOPY N/A 06/26/2017   Procedure: ESOPHAGOGASTRODUODENOSCOPY (EGD);  Surgeon: Manya Silvas, MD;  Location: [facility] ENDOSCOPY;  Service: Endoscopy;  Laterality: N/A;  . ESOPHAGOGASTRODUODENOSCOPY N/A 06/27/2017   Procedure: ESOPHAGOGASTRODUODENOSCOPY (EGD);  Surgeon: Manya Silvas, MD;  Location: [facility] ENDOSCOPY;  Service: Endoscopy;  Laterality: N/A;  . ESOPHAGOGASTRODUODENOSCOPY N/A 06/28/2017   Procedure: ESOPHAGOGASTRODUODENOSCOPY (EGD);  Surgeon: Manya Silvas, MD;  Location: [facility] ENDOSCOPY;  Service: Endoscopy;  Laterality: N/A;  . ESOPHAGOGASTRODUODENOSCOPY Bilateral 10/11/2017   Procedure: ESOPHAGOGASTRODUODENOSCOPY (EGD);  Surgeon: Manya Silvas, MD;  Location: [facility] ENDOSCOPY;  Service: Endoscopy;  Laterality: Bilateral;  . ESOPHAGOGASTRODUODENOSCOPY N/A 12/04/2017   Procedure: ESOPHAGOGASTRODUODENOSCOPY (EGD);  Surgeon: Manya Silvas, MD;  Location: [facility] ENDOSCOPY;  Service: Endoscopy;  Laterality: N/A;  . ESOPHAGOGASTRODUODENOSCOPY (EGD) WITH PROPOFOL N/A 07/20/2015   Procedure: ESOPHAGOGASTRODUODENOSCOPY (EGD) WITH PROPOFOL;  Surgeon: Manya Silvas, MD;  Location: [facility] ENDOSCOPY;  Service: Endoscopy;  Laterality: N/A;  . ESOPHAGOGASTRODUODENOSCOPY (EGD) WITH PROPOFOL N/A 09/16/2015   Procedure: ESOPHAGOGASTRODUODENOSCOPY (EGD) WITH PROPOFOL;  Surgeon: Manya Silvas, MD;  Location: [facility] ENDOSCOPY;  Service: Endoscopy;  Laterality: N/A;  . ESOPHAGOGASTRODUODENOSCOPY (EGD) WITH PROPOFOL N/A 03/16/2016   Procedure: ESOPHAGOGASTRODUODENOSCOPY (EGD) WITH PROPOFOL;  Surgeon: Manya Silvas,  MD;  Location: [facility] ENDOSCOPY;  Service: Endoscopy;  Laterality: N/A;  . ESOPHAGOGASTRODUODENOSCOPY (EGD) WITH PROPOFOL N/A 09/14/2016   Procedure:  ESOPHAGOGASTRODUODENOSCOPY (EGD) WITH PROPOFOL;  Surgeon: Manya Silvas, MD;  Location: [facility] ENDOSCOPY;  Service: Endoscopy;  Laterality: N/A;  . ESOPHAGOGASTRODUODENOSCOPY (EGD) WITH PROPOFOL N/A 11/05/2016   Procedure: ESOPHAGOGASTRODUODENOSCOPY (EGD) WITH PROPOFOL;  Surgeon: Manya Silvas, MD;  Location: [facility] ENDOSCOPY;  Service: Endoscopy;  Laterality: N/A;  . ESOPHAGOGASTRODUODENOSCOPY (EGD) WITH PROPOFOL N/A 02/06/2017   Procedure: ESOPHAGOGASTRODUODENOSCOPY (EGD) WITH PROPOFOL;  Surgeon: Manya Silvas, MD;  Location: [facility] ENDOSCOPY;  Service: Endoscopy;  Laterality: N/A;  . ESOPHAGOGASTRODUODENOSCOPY (EGD) WITH PROPOFOL N/A 04/24/2017   Procedure: ESOPHAGOGASTRODUODENOSCOPY (EGD) WITH PROPOFOL;  Surgeon: Manya Silvas, MD;  Location: [facility] ENDOSCOPY;  Service: Endoscopy;  Laterality: N/A;  . ESOPHAGOGASTRODUODENOSCOPY (EGD) WITH PROPOFOL N/A 07/31/2017   Procedure: ESOPHAGOGASTRODUODENOSCOPY (EGD) WITH PROPOFOL;  Surgeon: Manya Silvas, MD;  Location: [facility] ENDOSCOPY;  Service: Endoscopy;  Laterality: N/A;  . ESOPHAGOGASTRODUODENOSCOPY (EGD) WITH PROPOFOL N/A 08/08/2018   Procedure: ESOPHAGOGASTRODUODENOSCOPY (EGD) WITH PROPOFOL;  Surgeon: Manya Silvas, MD;  Location: [facility] ENDOSCOPY;  Service: Endoscopy;  Laterality: N/A;  . GIVENS CAPSULE STUDY N/A 12/05/2017   Procedure: GIVENS CAPSULE STUDY;  Surgeon: Manya Silvas, MD;  Location: [facility] ENDOSCOPY;  Service: Endoscopy;  Laterality: N/A;  . TONSILLECTOMY    . TONSILLECTOMY AND ADENOIDECTOMY    . ULNAR NERVE TRANSPOSITION    . UVULOPALATOPHARYNGOPLASTY     Social History   Socioeconomic History  . Marital status: Single    Spouse name: Not on file  . Number of children: Not on file  . Years of education: Not on file  . Highest education level: Not on file  Occupational History  . Occupation: disabled  Social Needs  . Financial resource strain: Not hard at all  . Food insecurity:    Worry: Never true     Inability: Never true  . Transportation needs:    Medical: No    Non-medical: No  Tobacco Use  . Smoking status: Current Some Day Smoker    Types: Cigars  . Smokeless tobacco: Never Used  . Tobacco comment: smoke 1 at night occasionally;   Substance and Sexual Activity  . Alcohol use: No    Comment: stopped 12 years ago  . Drug use: No  . Sexual activity: Never  Lifestyle  . Physical activity:    Days per week: 0 days    Minutes per session: 0 min  . Stress: Not at all  Relationships  . Social connections:    Talks on phone: More than three times a week    Gets together: Once a week    Attends religious service: Never    Active member of club or organization: No    Attends meetings of clubs or organizations: Never    Relationship status: Never married  Other Topics Concern  . Not on file  Social History Narrative   Lives alone   Family History  Problem Relation Age of Onset  . Diabetes Other   . Transient ischemic attack Father   . CAD Father    No Known Allergies Prior to Admission medications   Medication Sig Start Date End Date Taking? Authorizing Provider  docusate sodium (COLACE) 100 MG capsule Take 100 mg by mouth 2 (two) times daily.    [provider]  ferrous sulfate 325 (65 FE) MG tablet Take 325 mg by mouth daily with breakfast.    [provider]  furosemide (LASIX) 40 MG tablet Take 40 mg by mouth daily.    [provider]  insulin aspart protamine - aspart (NOVOLOG 70/30 MIX) (70-30) 100 UNIT/ML FlexPen Inject 84 Units into the skin daily with breakfast.     [provider]  insulin aspart protamine - aspart (NOVOLOG 70/30 MIX) (70-30) 100 UNIT/ML FlexPen Inject 65 Units into the skin at bedtime.    [provider]  lactulose (CHRONULAC) 10 GM/15ML solution Take 30 mLs by mouth 2 (two) times daily.  07/04/18   [provider]  LORazepam (ATIVAN) 0.5 MG tablet Take 0.5 mg by mouth every 8 (eight) hours as  needed for anxiety.     [provider]  metolazone (ZAROXOLYN) 2.5 MG tablet Take 2.5 mg by mouth daily.    [provider]  Multiple Vitamin (MULTIVITAMIN) tablet Take 1 tablet by mouth daily.    [provider]  omeprazole (PRILOSEC) 40 MG capsule Take 40 mg by mouth daily.    [provider]  Potassium Chloride ER 20 MEQ TBCR Take 40 mEq by mouth daily.     [provider]  potassium chloride SA (K-DUR,KLOR-CON) 20 MEQ tablet Take 20 mEq by mouth at bedtime.    [provider]  ranitidine (ZANTAC) 150 MG capsule Take 150 mg by mouth 2 (two) times daily as needed for heartburn.     [provider]  rifaximin (XIFAXAN) 550 MG TABS tablet Take 1 tablet (550 mg total) by mouth 2 (two) times daily. 01/01/18   Loletha Grayer, MD   Dg Chest 2 View  Result Date: 10/15/2018 CLINICAL DATA:  Unwitnessed fall.  Syncope. EXAM: CHEST - 2 VIEW COMPARISON:  Radiographs of July 18, 2018. FINDINGS: The heart size and mediastinal contours are within normal limits. Both lungs are clear. The visualized skeletal structures are unremarkable. IMPRESSION: No active cardiopulmonary disease. Electronically Signed   By: Marijo Conception, M.D.   On: 10/15/2018 20:17   Dg Ankle 2 Views Left  Result Date: 10/15/2018 CLINICAL DATA:  Left ankle pain after fall. EXAM: LEFT ANKLE - 2 VIEW COMPARISON:  None. FINDINGS: Mildly displaced oblique fracture is seen involving the distal left fibula with mildly displaced medial malleolar fracture. Vascular calcifications are noted. IMPRESSION: Mildly displaced distal left fibular and medial malleolar fractures. Electronically Signed   By: Marijo Conception, M.D.   On: 10/15/2018 20:27   Dg Ankle 2 Views Right  Result Date: 10/15/2018 CLINICAL DATA:  Right ankle pain after fall. EXAM: RIGHT ANKLE - 2 VIEW COMPARISON:  None. FINDINGS: Probable minimally displaced fracture is seen involving the medial malleolus with probable  mildly displaced posterior malleolar fracture. Distal fibula is unremarkable. Vascular calcifications are noted. IMPRESSION: Probable minimally displaced medial malleolar fracture with probable mildly displaced posterior malleolar fracture. Electronically Signed   By: Marijo Conception, M.D.   On: 10/15/2018 20:26   Dg Knee Complete 4 Views Right  Result Date: 10/15/2018 CLINICAL DATA:  Right knee pain after fall. EXAM: RIGHT KNEE - COMPLETE 4+ VIEW COMPARISON:  None. FINDINGS: Minimally displaced fracture is seen involving the proximal right fibula. The femur and tibia are unremarkable. The patella appears normal. No joint effusion is noted. No significant joint space narrowing is noted. IMPRESSION: Minimally displaced proximal right fibular fracture. Electronically Signed   By: Marijo Conception, M.D.   On: 10/15/2018 20:22   Dg Hip Unilat W Or Wo Pelvis 2-3 Views Right  Result Date: 10/15/2018  CLINICAL DATA:  Right hip pain after unwitnessed fall. EXAM: DG HIP (WITH OR WITHOUT PELVIS) 2-3V RIGHT COMPARISON:  None. FINDINGS: There is no evidence of hip fracture or dislocation. There is no evidence of arthropathy or other focal bone abnormality. IMPRESSION: Negative. Electronically Signed   By: Marijo Conception, M.D.   On: 10/15/2018 20:20    Positive ROS: All other systems have been reviewed and were otherwise negative with the exception of those mentioned in the HPI and as above.  Physical Exam: General: Well developed, well nourished male seen in no acute distress. HEENT: Atraumatic and normocephalic. Sclera are clear. Extraocular motion is intact. Oropharynx is clear with moist mucosa. Neck: Supple, nontender, good range of motion. No JVD or carotid bruits. Lungs: Clear to auscultation bilaterally. Cardiovascular: Regular rate and rhythm with normal S1 and S2.  2/6 murmur. No gallops or rubs.  Abdomen: Soft, nontender, and nondistended. Bowel sounds are present. Skin: There are trophic changes of  the skin to the toes on both feet. Neurologic: Awake, alert, and oriented. Sensory function is grossly intact. Motor strength is felt to be 5 over 5 bilaterally. No clonus or tremor. Good motor coordination. Lymphatic: No axillary or cervical lymphadenopathy  MUSCULOSKELETAL: Posterior splints with stirrup reinforcement were in place to both lower extremities.  Fair capillary refill to the toes of both feet.  Some edema to the toes and dorsum of the feet.  Assessment: Left bimalleolar ankle fracture Right posterior malleolar fracture  Plan: The findings were discussed in detail with the patient.  The patient is at increased risk for surgical complications given his medical comorbidities.  The alignment is reasonably good to both ankles and consideration will be given to cast immobilization.  In the meantime, the patient will be optimized with regards to his ammonia level as well as his blood sugars.  Maltos P. Holley Bouche M.D.

## 2018-10-16 NOTE — Progress Notes (Signed)
*  PRELIMINARY RESULTS* Echocardiogram 2D Echocardiogram has been performed.  Corey West [place] 10/16/2018, 10:00 AM

## 2018-10-16 NOTE — Procedures (Signed)
Transthoracic echocardiogram  Corey West Date of birth [birth year] Study date October 16, 2018 Medical record #[id number] Accession #[id number] Ordering physician: Anselm Jungling [place] physician: Rockey Situ Reason for study: Acute diastolic CHF Other past medical history hypertension, diabetes, chronic kidney disease, hyperlipidemia  Left ventricle Cavity size mildly dilated, mild concentric LVH/hypertrophy Systolic function was normal with estimated ejection fraction 55 to 60% Wall motion was normal, no regional wall motion abnormalities, grade 2 diastolic dysfunction  Left atrium Mildly dilated  Right ventricle normal size with normal systolic function  Pulmonary arteries Pulmonary arterial pressure could not be accurately assessed/estimated given no significant tricuspid valve regurgitation  Inferior vena cava Was moderately dilated, respirophasic diameter changes were blunted less than 50% consistent with elevated central venous pressure  Please see complete report scanned in for further details  Signed, Esmond Plants, MD, Ph.D [facility]

## 2018-10-16 NOTE — NC FL2 (Signed)
[place] LEVEL OF CARE SCREENING TOOL     IDENTIFICATION  Patient Name: Corey West Birthdate: November 16, 1950 Sex: male Admission Date (Current Location): 10/15/2018  [place] and [place] Number:  Engineering geologist and Address:  [facility], [address], [place], [place] [address]      Provider Number: [id number]  Attending Physician Name and Address:  Gladstone Lighter, MD  Relative Name and Phone Number:       Current Level of Care: Hospital Recommended Level of Care: [place] Prior Approval Number:    Date Approved/Denied:   PASRR Number: [id number])  Discharge Plan: SNF    Current Diagnoses: Patient Active Problem List   Diagnosis Date Noted  . Ankle fracture, left, closed, initial encounter 10/15/2018  . Ankle fracture, right, closed, initial encounter 10/15/2018  . Ankle fracture 10/15/2018  . Pressure injury of skin 07/19/2018  . UTI (urinary tract infection) 07/18/2018  . Goals of care, counseling/discussion   . Palliative care encounter   . Hepatic encephalopathy syndrome ([place]) 12/16/2017  . Hepatic encephalopathy ([place]) 10/18/2017  . Hypokalemia 10/09/2017  . Hyperglycemia 10/09/2017  . Symptomatic anemia 06/26/2017  . Sepsis ([place]) 06/12/2017  . Cellulitis in diabetic foot ([place]) 02/21/2017  . Uncontrolled diabetes mellitus ([place]) 02/21/2017  . Cellulitis 02/21/2017  . GI bleed 07/20/2015  . Hematemesis 02/08/2015  . Cirrhosis ([place]) 02/08/2015  . Esophageal varices ([place]) 02/08/2015  . Type 2 diabetes mellitus ([place]) 02/08/2015  . Gastroesophageal reflux disease 02/08/2015  . Hyperlipidemia 02/08/2015  . Iron deficiency anemia 12/04/2013  . Thrombocytopenia ([place]) 12/04/2013  . Splenomegaly 11/04/2013    Orientation RESPIRATION BLADDER Height & Weight     Self, Time, Situation, Place  Normal Incontinent Weight: 288 lb (130.6 kg) Height:  6' (182.9 cm)  BEHAVIORAL SYMPTOMS/MOOD NEUROLOGICAL  BOWEL NUTRITION STATUS      Incontinent Diet(Diet: Heart Healthy )  AMBULATORY STATUS COMMUNICATION OF NEEDS Skin   Extensive Assist Verbally Other (Comment)(Skin Tear Left Arm. )                       Personal Care Assistance Level of Assistance  Bathing, Feeding, Dressing Bathing Assistance: Limited assistance Feeding assistance: Independent Dressing Assistance: Limited assistance     Functional Limitations Info  Sight, Hearing, Speech Sight Info: Adequate Hearing Info: Adequate Speech Info: Adequate    SPECIAL CARE FACTORS FREQUENCY  PT (By licensed PT), OT (By licensed OT)     PT Frequency: (5) OT Frequency: (5)            Contractures      Additional Factors Info  Code Status, Allergies Code Status Info: (Full Code. ) Allergies Info: (No Known Allergies. )           Current Medications (10/16/2018):  This is the current hospital active medication list Current Facility-Administered Medications  Medication Dose Route Frequency Provider Last Rate Last Dose  . docusate sodium (COLACE) capsule 100 mg  100 mg Oral BID [name], MD      . famotidine (PEPCID) tablet 10 mg  10 mg Oral BID [name], MD   10 mg at 10/16/18 0939  . ferrous sulfate tablet 325 mg  325 mg Oral Q breakfast [name], MD   325 mg at 10/16/18 0939  . furosemide (LASIX) tablet 40 mg  40 mg Oral Daily [name], MD   40 mg at 10/16/18 0939  . insulin aspart (novoLOG) injection 0-9 Units  0-9 Units Subcutaneous TID WC [name], MD   3 Units at 10/16/18 1245  . insulin aspart protamine- aspart (NOVOLOG MIX 70/30) injection 50 Units  50 Units Subcutaneous BID WC Gladstone Lighter, MD      . lactulose (CHRONULAC) 10 GM/15ML solution 20 g  20 g Oral BID [name], MD   20 g at 10/16/18 5329  . LORazepam (ATIVAN) tablet 0.5 mg  0.5 mg Oral Q8H PRN [name], MD      . metolazone (ZAROXOLYN) tablet 2.5 mg   2.5 mg Oral Daily [name], MD   2.5 mg at 10/16/18 9242  . morphine 2 MG/ML injection 1 mg  1 mg Intravenous Q3H PRN [name], MD   1 mg at 10/16/18 1004  . multivitamin with minerals tablet 1 tablet  1 tablet Oral Daily [name], MD   1 tablet at 10/16/18 [phone]  . pantoprazole (PROTONIX) EC tablet 40 mg  40 mg Oral Daily [name], MD   40 mg at 10/16/18 0939  . potassium chloride SA (K-DUR,KLOR-CON) CR tablet 40 mEq  40 mEq Oral Daily [name], MD   40 mEq at 10/16/18 0939  . rifaximin (XIFAXAN) tablet 550 mg  550 mg Oral BID [name], MD   550 mg at 10/16/18 1962     Discharge Medications: Please see discharge summary for a list of discharge medications.  Relevant Imaging Results:  Relevant Lab Results:   Additional Information (SSN: [id number])  Arabel Barcenas, Veronia Beets, LCSW

## 2018-10-16 NOTE — Clinical Social Work Placement (Signed)
   CLINICAL SOCIAL WORK PLACEMENT  NOTE  Date:  10/16/2018  Patient Details  Name: Corey West MRN: [id number] Date of Birth: 26-Jul-1951  Clinical Social Work is seeking post-discharge placement for this patient at the [place] level of care (*CSW will initial, date and re-position this form in  chart as items are completed):  Yes   Patient/family provided with [facility] Work Department's list of facilities offering this level of care within the geographic area requested by the patient (or if unable, by the patient's family).  Yes   Patient/family informed of their freedom to choose among providers that offer the needed level of care, that participate in Medicare, Medicaid or managed care program needed by the patient, have an available bed and are willing to accept the patient.  Yes   Patient/family informed of [facility]'s ownership interest in [facility] and [facility], as well as of the fact that they are under no obligation to receive care at these facilities.  PASRR submitted to EDS on       PASRR number received on       Existing PASRR number confirmed on 10/16/18     FL2 transmitted to all facilities in geographic area requested by pt/family on 10/16/18     FL2 transmitted to all facilities within larger geographic area on       Patient informed that his/her managed care company has contracts with or will negotiate with certain facilities, including the following:            Patient/family informed of bed offers received.  Patient chooses bed at       Physician recommends and patient chooses bed at      Patient to be transferred to   on  .  Patient to be transferred to facility by       Patient family notified on   of transfer.  Name of family member notified:        PHYSICIAN       Additional Comment:    _______________________________________________ Jerrye Seebeck, Veronia Beets, LCSW 10/16/2018, 3:36 PM

## 2018-10-16 NOTE — Progress Notes (Signed)
[facility] [place] NAME: Corey West    MR#:  [id number]  DATE OF BIRTH:  1951/09/01  SUBJECTIVE:  CHIEF COMPLAINT:   Chief Complaint  Patient presents with  . Fall  . Loss of Consciousness  . Ankle Pain   -Fall and bilateral ankle fractures.  Complaining of significant pain. -Mental status is back to baseline.  Elevated blood sugars noted  REVIEW OF SYSTEMS:  Review of Systems  Constitutional: Positive for malaise/fatigue. Negative for chills and fever.  HENT: Negative for hearing loss.   Eyes: Negative for blurred vision and double vision.  Respiratory: Negative for cough, shortness of breath and wheezing.   Cardiovascular: Negative for chest pain and palpitations.  Gastrointestinal: Negative for abdominal pain, constipation, diarrhea, nausea and vomiting.  Genitourinary: Negative for dysuria.  Musculoskeletal: Positive for joint pain and myalgias.  Neurological: Negative for dizziness, focal weakness, seizures, weakness and headaches.  Psychiatric/Behavioral: Negative for depression.    DRUG ALLERGIES:  No Known Allergies  VITALS:  Blood pressure (!) 154/51, pulse 72, temperature 98.4 F (36.9 C), temperature source Oral, resp. rate 18, height 6' (1.829 m), weight 130.6 kg, SpO2 99 %.  PHYSICAL EXAMINATION:  Physical Exam   GENERAL:  68 y.o.-year-old obese patient lying in the bed , appears to be in pain from both ankle fractures. EYES: Pupils equal, round, reactive to light and accommodation. No scleral icterus. Extraocular muscles intact.  HEENT: Head atraumatic, normocephalic. Oropharynx and nasopharynx clear.  NECK:  Supple, no jugular venous distention. No thyroid enlargement, no tenderness.  LUNGS: Normal breath sounds bilaterally, no wheezing, rales,rhonchi or crepitation. No use of accessory muscles of respiration.  Decreased bibasilar breath sounds CARDIOVASCULAR: S1, S2 normal. No murmurs, rubs, or gallops.    ABDOMEN: Soft, obese, nontender, nondistended. Bowel sounds present. No organomegaly or mass.  EXTREMITIES: No cyanosis, or clubbing.  Both ankles immobilized in soft splints NEUROLOGIC: Cranial nerves II through XII are intact. Muscle strength 5/5 in all extremities. Sensation intact. Gait not checked.  PSYCHIATRIC: The patient is alert and oriented x 3.  SKIN: No obvious rash, lesion, or ulcer.    LABORATORY PANEL:   CBC Recent Labs  Lab 10/16/18 0511  WBC 9.8  HGB 9.3*  HCT 26.9*  PLT 75*   ------------------------------------------------------------------------------------------------------------------  Chemistries  Recent Labs  Lab 10/15/18 2014 10/16/18 0511  NA 136 138  K 3.8 3.2*  CL 102 105  CO2 24 25  GLUCOSE 334* 311*  BUN 41* 42*  CREATININE 2.48* 2.27*  CALCIUM 9.3 9.1  AST 40  --   ALT 28  --   ALKPHOS 72  --   BILITOT 1.0  --    ------------------------------------------------------------------------------------------------------------------  Cardiac Enzymes No results for input(s): TROPONINI in the last 168 hours. ------------------------------------------------------------------------------------------------------------------  RADIOLOGY:  Dg Chest 2 View  Result Date: 10/15/2018 CLINICAL DATA:  Unwitnessed fall.  Syncope. EXAM: CHEST - 2 VIEW COMPARISON:  Radiographs of July 18, 2018. FINDINGS: The heart size and mediastinal contours are within normal limits. Both lungs are clear. The visualized skeletal structures are unremarkable. IMPRESSION: No active cardiopulmonary disease. Electronically Signed   By: Marijo Conception, M.D.   On: 10/15/2018 20:17   Dg Ankle 2 Views Left  Result Date: 10/15/2018 CLINICAL DATA:  Left ankle pain after fall. EXAM: LEFT ANKLE - 2 VIEW COMPARISON:  None. FINDINGS: Mildly displaced oblique fracture is seen involving the distal left fibula with mildly displaced medial malleolar fracture. Vascular  calcifications are  noted. IMPRESSION: Mildly displaced distal left fibular and medial malleolar fractures. Electronically Signed   By: Marijo Conception, M.D.   On: 10/15/2018 20:27   Dg Ankle 2 Views Right  Result Date: 10/15/2018 CLINICAL DATA:  Right ankle pain after fall. EXAM: RIGHT ANKLE - 2 VIEW COMPARISON:  None. FINDINGS: Probable minimally displaced fracture is seen involving the medial malleolus with probable mildly displaced posterior malleolar fracture. Distal fibula is unremarkable. Vascular calcifications are noted. IMPRESSION: Probable minimally displaced medial malleolar fracture with probable mildly displaced posterior malleolar fracture. Electronically Signed   By: Marijo Conception, M.D.   On: 10/15/2018 20:26   Dg Knee Complete 4 Views Right  Result Date: 10/15/2018 CLINICAL DATA:  Right knee pain after fall. EXAM: RIGHT KNEE - COMPLETE 4+ VIEW COMPARISON:  None. FINDINGS: Minimally displaced fracture is seen involving the proximal right fibula. The femur and tibia are unremarkable. The patella appears normal. No joint effusion is noted. No significant joint space narrowing is noted. IMPRESSION: Minimally displaced proximal right fibular fracture. Electronically Signed   By: Marijo Conception, M.D.   On: 10/15/2018 20:22   Dg Hip Unilat W Or Wo Pelvis 2-3 Views Right  Result Date: 10/15/2018 CLINICAL DATA:  Right hip pain after unwitnessed fall. EXAM: DG HIP (WITH OR WITHOUT PELVIS) 2-3V RIGHT COMPARISON:  None. FINDINGS: There is no evidence of hip fracture or dislocation. There is no evidence of arthropathy or other focal bone abnormality. IMPRESSION: Negative. Electronically Signed   By: Marijo Conception, M.D.   On: 10/15/2018 20:20    EKG:   Orders placed or performed during the hospital encounter of 10/15/18  . EKG    ASSESSMENT AND PLAN:   XT 67-year-old obese male with past medical history significant for CKD stage III, insulin-dependent diabetes mellitus, sleep apnea, GERD, history of GI  bleed, liver cirrhosis presents to hospital secondary to fall and noted to have bilateral ankle fractures.  1.  Hepatic encephalopathy-improved with lactulose. -Known history of liver disease.  Also has pancytopenia from the same -Continue lactulose and rifaximin.  Much improved ammonia today.  2.  Bilateral ankle fractures-left ankle x-ray showing left fibular and medial malleolar fractures, right ankle x-ray showing displaced medial malleolar fracture and posterior malleolar fracture -Appreciate orthopedics consult.  Continue to monitor if patient needs surgery versus cast placement  3.  Diabetes mellitus-with hyperglycemia.  Check A1c.  Increase his 70/30 insulin for now -Monitor sugars closely.  On sliding scale insulin  4.  Hypokalemia-being replaced.  Monitor as patient on Lasix, metolazone and also has lactulose.  5.  Chronic diastolic CHF-follow-up echocardiogram continue diuretics and monitor.  6.  DVT prophylaxis-we will hold off on heparin products due to possible need for surgery. .   All the records are reviewed and case discussed with Care Management/Social Workerr. Management plans discussed with the patient, family and they are in agreement.  CODE STATUS: Full code  TOTAL TIME TAKING CARE OF THIS PATIENT: 39 minutes.   POSSIBLE D/C IN 2-3 DAYS, DEPENDING ON CLINICAL CONDITION.   Gladstone Lighter M.D on 10/16/2018 at 2:29 PM  Between 7am to 6pm - Pager - [phone]  After 6pm go to [email or web address] - password [email or web address] [facility]  Office  [phone]  CC: Primary care physician; Baxter Hire, MD

## 2018-10-17 LAB — BASIC METABOLIC PANEL
Anion gap: 6 (ref 5–15)
BUN: 38 mg/dL — ABNORMAL HIGH (ref 8–23)
CHLORIDE: 105 mmol/L (ref 98–111)
CO2: 28 mmol/L (ref 22–32)
Calcium: 9 mg/dL (ref 8.9–10.3)
Creatinine, Ser: 2.07 mg/dL — ABNORMAL HIGH (ref 0.61–1.24)
GFR calc Af Amer: 38 mL/min — ABNORMAL LOW (ref >60)
GFR calc non Af Amer: 32 mL/min — ABNORMAL LOW (ref >60)
Glucose, Bld: 122 mg/dL — ABNORMAL HIGH (ref 70–99)
Potassium: 3.4 mmol/L — ABNORMAL LOW (ref 3.5–5.1)
Sodium: 139 mmol/L (ref 135–145)

## 2018-10-17 LAB — GLUCOSE, CAPILLARY
GLUCOSE-CAPILLARY: 203 mg/dL — AB (ref 70–99)
Glucose-Capillary: 158 mg/dL — ABNORMAL HIGH (ref 70–99)
Glucose-Capillary: 202 mg/dL — ABNORMAL HIGH (ref 70–99)
Glucose-Capillary: 261 mg/dL — ABNORMAL HIGH (ref 70–99)

## 2018-10-17 LAB — HIV ANTIBODY (ROUTINE TESTING W REFLEX): HIV Screen 4th Generation wRfx: NONREACTIVE

## 2018-10-17 MED ORDER — HEPARIN SODIUM (PORCINE) 5000 UNIT/ML IJ SOLN
5000.0000 [IU] | Freq: Three times a day (TID) | INTRAMUSCULAR | Status: DC
  Administered 2018-10-17 – 2018-10-18 (×3): 5000 [IU] via SUBCUTANEOUS
  Filled 2018-10-17 (×3): qty 1

## 2018-10-17 MED ORDER — HYDROCODONE-ACETAMINOPHEN 5-325 MG PO TABS
1.0000 | ORAL_TABLET | Freq: Four times a day (QID) | ORAL | Status: DC | PRN
  Administered 2018-10-17 – 2018-10-18 (×5): 1 via ORAL
  Filled 2018-10-17 (×5): qty 1

## 2018-10-17 MED ORDER — MORPHINE SULFATE (PF) 2 MG/ML IV SOLN: 2.0000 mg | INTRAVENOUS | Status: DC | PRN

## 2018-10-17 NOTE — Progress Notes (Signed)
Clinical Education officer, museum (CSW) met with patient and presented bed offers. Patient chose [facility]. [name] liaison is aware of accepted bed offer.   [employer], LCSW [phone]

## 2018-10-17 NOTE — Consult Note (Signed)
ORTHOPAEDICS PROGRESS NOTE  PATIENT NAME: Corey West DOB: December 03, 1950  MRN: [id number]  Subjective: Moderate amount of ankle pain, relieved with oral analgesics.  Objective: Vital signs in last 24 hours: Temp:  [98.3 F (36.8 C)-98.6 F (37 C)] 98.3 F (36.8 C) (01/17 1708) Pulse Rate:  [69-70] 70 (01/17 1708) Resp:  [14-20] 20 (01/17 1708) BP: (125-157)/(49-59) 133/49 (01/17 1708) SpO2:  [97 %-100 %] 99 % (01/17 1708)  Intake/Output from previous day: 01/16 0701 - 01/17 0700 In: 600 [P.O.:600] Out: 250 [Urine:250]  Recent Labs    10/15/18 2014 10/16/18 0511 10/17/18 0311  WBC 15.5* 9.8  --   HGB 10.2* 9.3*  --   HCT 29.1* 26.9*  --   PLT 82* 75*  --   K 3.8 3.2* 3.4*  CL 102 105 105  CO2 24 25 28   BUN 41* 42* 38*  CREATININE 2.48* 2.27* 2.07*  GLUCOSE 334* 311* 122*  CALCIUM 9.3 9.1 9.0    EXAM General: Well developed, well nourished male seen in no apparent discomfort. Lower extremities: Posterior splints with stirrup to both ankles. Some decrease in edema to toes. Good capillary refill. Both [employer] devices were nonfunctional and the lower extremities were not elevated. Neurologic: Awake, alert, and oriented. Sensory and motor function are grossly intact.  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 Foot of bed was elevated.  The treatment plans were discussed with the patient. We will continue with elevation and cold therapy in an attempt to minimize swelling to the ankles. Alignment is acceptable at this time. I have recommended continuation of iimmobilization and nonweightbearing  with repeat radiographs in 5-7 days. I anticipate removal of the splints at that time for inspection of the skin integrity and evaluation of the swelling.  OK to transfer to SND with follow up in the office in 5-7 days.  Gaby P. Holley Bouche M.D.

## 2018-10-17 NOTE — Progress Notes (Signed)
Pt c/o pain in bilateral ankles. He states the morphine helped but "it doesn't last". MD notified. Orders for norco q6h PRN. Will give dose and continue to monitor.

## 2018-10-17 NOTE — Progress Notes (Signed)
[facility] at [place] NAME: Corey West    MR#:  [id number]  DATE OF BIRTH:  [birth year]  SUBJECTIVE:  CHIEF COMPLAINT:   Chief Complaint  Patient presents with  . Fall  . Loss of Consciousness  . Ankle Pain   -Still complains of bilateral lower extremity ankle pain. -Had a rough night due to pain last night  REVIEW OF SYSTEMS:  Review of Systems  Constitutional: Positive for malaise/fatigue. Negative for chills and fever.  HENT: Negative for hearing loss.   Eyes: Negative for blurred vision and double vision.  Respiratory: Negative for cough, shortness of breath and wheezing.   Cardiovascular: Negative for chest pain and palpitations.  Gastrointestinal: Negative for abdominal pain, constipation, diarrhea, nausea and vomiting.  Genitourinary: Negative for dysuria.  Musculoskeletal: Positive for joint pain and myalgias.  Neurological: Negative for dizziness, focal weakness, seizures, weakness and headaches.  Psychiatric/Behavioral: Negative for depression.    DRUG ALLERGIES:  No Known Allergies  VITALS:  Blood pressure (!) 125/50, pulse 69, temperature 98.3 F (36.8 C), resp. rate 20, height 6' (1.829 m), weight 130.6 kg, SpO2 97 %.  PHYSICAL EXAMINATION:  Physical Exam   GENERAL:  68 y.o.-year-old obese patient lying in the bed , appears to be in pain from both ankle fractures. EYES: Pupils equal, round, reactive to light and accommodation. No scleral icterus. Extraocular muscles intact.  HEENT: Head atraumatic, normocephalic. Oropharynx and nasopharynx clear.  NECK:  Supple, no jugular venous distention. No thyroid enlargement, no tenderness.  LUNGS: Normal breath sounds bilaterally, no wheezing, rales,rhonchi or crepitation. No use of accessory muscles of respiration.  Decreased bibasilar breath sounds CARDIOVASCULAR: S1, S2 normal. No murmurs, rubs, or gallops.  ABDOMEN: Soft, obese, nontender, nondistended. Bowel sounds  present. No organomegaly or mass.  EXTREMITIES: No cyanosis, or clubbing.  Both ankles immobilized in soft splints NEUROLOGIC: Cranial nerves II through XII are intact. Muscle strength 5/5 in all extremities. Sensation intact. Gait not checked.  PSYCHIATRIC: The patient is alert and oriented x 3.  SKIN: No obvious rash, lesion, or ulcer.    LABORATORY PANEL:   CBC Recent Labs  Lab 10/16/18 0511  WBC 9.8  HGB 9.3*  HCT 26.9*  PLT 75*   ------------------------------------------------------------------------------------------------------------------  Chemistries  Recent Labs  Lab 10/15/18 2014  10/17/18 0311  NA 136   < > 139  K 3.8   < > 3.4*  CL 102   < > 105  CO2 24   < > 28  GLUCOSE 334*   < > 122*  BUN 41*   < > 38*  CREATININE 2.48*   < > 2.07*  CALCIUM 9.3   < > 9.0  AST 40  --   --   ALT 28  --   --   ALKPHOS 72  --   --   BILITOT 1.0  --   --    < > = values in this interval not displayed.   ------------------------------------------------------------------------------------------------------------------  Cardiac Enzymes No results for input(s): TROPONINI in the last 168 hours. ------------------------------------------------------------------------------------------------------------------  RADIOLOGY:  Dg Chest 2 View  Result Date: 10/15/2018 CLINICAL DATA:  Unwitnessed fall.  Syncope. EXAM: CHEST - 2 VIEW COMPARISON:  Radiographs of July 18, 2018. FINDINGS: The heart size and mediastinal contours are within normal limits. Both lungs are clear. The visualized skeletal structures are unremarkable. IMPRESSION: No active cardiopulmonary disease. Electronically Signed   By: Marijo Conception, M.D.   On: 10/15/2018  20:17   Dg Ankle 2 Views Left  Result Date: 10/15/2018 CLINICAL DATA:  Left ankle pain after fall. EXAM: LEFT ANKLE - 2 VIEW COMPARISON:  None. FINDINGS: Mildly displaced oblique fracture is seen involving the distal left fibula with mildly displaced  medial malleolar fracture. Vascular calcifications are noted. IMPRESSION: Mildly displaced distal left fibular and medial malleolar fractures. Electronically Signed   By: Marijo Conception, M.D.   On: 10/15/2018 20:27   Dg Ankle 2 Views Right  Result Date: 10/15/2018 CLINICAL DATA:  Right ankle pain after fall. EXAM: RIGHT ANKLE - 2 VIEW COMPARISON:  None. FINDINGS: Probable minimally displaced fracture is seen involving the medial malleolus with probable mildly displaced posterior malleolar fracture. Distal fibula is unremarkable. Vascular calcifications are noted. IMPRESSION: Probable minimally displaced medial malleolar fracture with probable mildly displaced posterior malleolar fracture. Electronically Signed   By: Marijo Conception, M.D.   On: 10/15/2018 20:26   Dg Knee Complete 4 Views Right  Result Date: 10/15/2018 CLINICAL DATA:  Right knee pain after fall. EXAM: RIGHT KNEE - COMPLETE 4+ VIEW COMPARISON:  None. FINDINGS: Minimally displaced fracture is seen involving the proximal right fibula. The femur and tibia are unremarkable. The patella appears normal. No joint effusion is noted. No significant joint space narrowing is noted. IMPRESSION: Minimally displaced proximal right fibular fracture. Electronically Signed   By: Marijo Conception, M.D.   On: 10/15/2018 20:22   Dg Hip Unilat W Or Wo Pelvis 2-3 Views Right  Result Date: 10/15/2018 CLINICAL DATA:  Right hip pain after unwitnessed fall. EXAM: DG HIP (WITH OR WITHOUT PELVIS) 2-3V RIGHT COMPARISON:  None. FINDINGS: There is no evidence of hip fracture or dislocation. There is no evidence of arthropathy or other focal bone abnormality. IMPRESSION: Negative. Electronically Signed   By: Marijo Conception, M.D.   On: 10/15/2018 20:20    EKG:   Orders placed or performed during the hospital encounter of 10/15/18  . EKG    ASSESSMENT AND PLAN:   XT 2-year-old obese male with past medical history significant for CKD stage III, insulin-dependent  diabetes mellitus, sleep apnea, GERD, history of GI bleed, liver cirrhosis presents to hospital secondary to fall and noted to have bilateral ankle fractures.  1.  Hepatic encephalopathy-improved with lactulose. -Known history of liver disease.  Also has pancytopenia from the same -Continue lactulose and rifaximin.  Much improved ammonia levels.  2.  Bilateral ankle fractures-left ankle x-ray showing left fibular and medial malleolar fractures, - right ankle x-ray showing displaced medial malleolar fracture and posterior malleolar fracture -Appreciate orthopedics consult.   -Plan for conservative management, cast will be placed.  Patient will go to rehab as he is nonweightbearing.  Follow-up x-rays as outpatient with Ortho in 1 week.  3.  Diabetes mellitus-with hyperglycemia.  A1c of 9.1 -.  Increased his 70/30 insulin for now-sugars are improved today -Monitor sugars closely.  On sliding scale insulin  4.  Hypokalemia-being replaced.  Monitor as patient on Lasix, metolazone and also has lactulose.  5.  Chronic diastolic CHF-echo with diastolic dysfunction -Continue Lasix and metolazone.  6.  DVT prophylaxis-start subcutaneous heparin as no plans for surgery. . Will need rehab at discharge.  Social worker consult appreciated   All the records are reviewed and case discussed with Care Management/Social Workerr. Management plans discussed with the patient, family and they are in agreement.  CODE STATUS: Full code  TOTAL TIME TAKING CARE OF THIS PATIENT: 37 minutes.   POSSIBLE  D/C IN 2-3 DAYS, DEPENDING ON CLINICAL CONDITION.   Gladstone Lighter M.D on 10/17/2018 at 11:36 AM  Between 7am to 6pm - Pager - [phone]  After 6pm go to [email or web address] - password [email or web address] [facility]  Office  [phone]  CC: Primary care physician; Baxter Hire, MD

## 2018-10-18 LAB — BASIC METABOLIC PANEL
ANION GAP: 7 (ref 5–15)
BUN: 44 mg/dL — ABNORMAL HIGH (ref 8–23)
CO2: 28 mmol/L (ref 22–32)
Calcium: 9.3 mg/dL (ref 8.9–10.3)
Chloride: 102 mmol/L (ref 98–111)
Creatinine, Ser: 2.19 mg/dL — ABNORMAL HIGH (ref 0.61–1.24)
GFR calc Af Amer: 35 mL/min — ABNORMAL LOW (ref >60)
GFR calc non Af Amer: 30 mL/min — ABNORMAL LOW (ref >60)
Glucose, Bld: 223 mg/dL — ABNORMAL HIGH (ref 70–99)
Potassium: 3.6 mmol/L (ref 3.5–5.1)
Sodium: 137 mmol/L (ref 135–145)

## 2018-10-18 LAB — GLUCOSE, CAPILLARY
Glucose-Capillary: 259 mg/dL — ABNORMAL HIGH (ref 70–99)
Glucose-Capillary: 314 mg/dL — ABNORMAL HIGH (ref 70–99)

## 2018-10-18 MED ORDER — FUROSEMIDE 40 MG PO TABS: 40.0000 mg | ORAL_TABLET | Freq: Every day | ORAL | Status: DC

## 2018-10-18 MED ORDER — HYDROCODONE-ACETAMINOPHEN 5-325 MG PO TABS: 1.0000 | ORAL_TABLET | Freq: Four times a day (QID) | ORAL | 0 refills | Status: DC | PRN

## 2018-10-18 MED ORDER — BISACODYL 10 MG RE SUPP: 10.0000 mg | Freq: Every day | RECTAL | Status: DC

## 2018-10-18 MED ORDER — LACTULOSE 10 GM/15ML PO SOLN
20.0000 g | Freq: Three times a day (TID) | ORAL | Status: DC
  Administered 2018-10-18: 20 g via ORAL
  Filled 2018-10-18: qty 30

## 2018-10-18 MED ORDER — LORAZEPAM 0.5 MG PO TABS: 0.5000 mg | ORAL_TABLET | Freq: Three times a day (TID) | ORAL | 0 refills | Status: DC | PRN

## 2018-10-18 MED ORDER — POTASSIUM CHLORIDE ER 20 MEQ PO TBCR: 20.0000 meq | EXTENDED_RELEASE_TABLET | Freq: Every day | ORAL | 1 refills | Status: DC

## 2018-10-18 MED ORDER — POTASSIUM CHLORIDE ER 20 MEQ PO TBCR: 20.0000 meq | EXTENDED_RELEASE_TABLET | Freq: Two times a day (BID) | ORAL | 1 refills | Status: DC

## 2018-10-18 MED ORDER — METOLAZONE 2.5 MG PO TABS: 2.5000 mg | ORAL_TABLET | Freq: Every day | ORAL | 0 refills | Status: DC

## 2018-10-18 MED ORDER — HEPARIN SODIUM (PORCINE) 5000 UNIT/ML IJ SOLN: 5000.0000 [IU] | Freq: Three times a day (TID) | INTRAMUSCULAR | 0 refills | Status: DC

## 2018-10-18 NOTE — Discharge Summary (Signed)
Corey West at [place] NAME: Corey West    MR#:  [id number]  DATE OF BIRTH:  [birth year]  DATE OF ADMISSION:  10/15/2018   ADMITTING PHYSICIAN: Corey Basta, MD  DATE OF DISCHARGE:  10/18/18  PRIMARY CARE PHYSICIAN: Corey Hire, MD   ADMISSION DIAGNOSIS:   Hepatic encephalopathy ([place]) [K72.90] Pain [R52] Closed bimalleolar fracture of left ankle, initial encounter [S82.842A] Closed bimalleolar fracture of right ankle, initial encounter [S82.841A] Closed fracture of proximal end of right fibula, unspecified fracture morphology, initial encounter [S82.831A]  DISCHARGE DIAGNOSIS:   Active Problems:   Hepatic encephalopathy (HCC)   Ankle fracture, left, closed, initial encounter   Ankle fracture, right, closed, initial encounter   Ankle fracture   SECONDARY DIAGNOSIS:   Past Medical History:  Diagnosis Date  . Anemia   . Anxiety    controlled;   . Arthritis   . AVM (arteriovenous malformation) of stomach, acquired with hemorrhage   . Barrett's esophagus   . Chronic kidney disease    renal infufficiency  . Cirrhosis ([place])   . Depression    controlled;   Marland Kitchen Diabetes mellitus without complication ([place])    not controlled, taking insulin but sugar continues to run high;   . Edema   . Esophageal varices ([place])   . GAVE (gastric antral vascular ectasia)   . GERD (gastroesophageal reflux disease)   . History of hiatal hernia   . Hyperlipidemia   . Hypertension    controlled well;   . 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 Obesity   . Pancytopenia ([place])   . Polyp, stomach    with chronic blood loss  . Sleep apnea    does not wear a cpap, Medicare would not pay for it   . Venous stasis dermatitis of both lower extremities   . Venous stasis of both lower extremities    with cellulitis    HOSPITAL COURSE:   67 year old obese male with past medical history significant for CKD stage III, insulin-dependent diabetes  mellitus, sleep apnea, GERD, history of GI bleed, liver cirrhosis presents to hospital secondary to fall and noted to have bilateral ankle fractures.  1.  Hepatic encephalopathy-improved with lactulose. -Known history of liver disease.  Also has pancytopenia from the same -Continue lactulose and rifaximin.  Much improved ammonia levels. -Clinically more alert and back to baseline.  2.  Bilateral ankle fractures-left ankle x-ray showing left fibular and medial malleolar fractures, - right ankle x-ray showing displaced medial malleolar fracture and posterior malleolar fracture -Appreciate orthopedics consult.   -Plan for conservative management, cast will be placed.  Patient will go to rehab as he is nonweightbearing.  Follow-up x-rays as outpatient with Ortho in 1 week.  3.  Diabetes mellitus-with hyperglycemia.  A1c of 9.1 -.  Increased his 70/30 insulin to home dosage. -Monitor sugars closely.    4.  Hypokalemia-being replaced.  Monitor as patient on Lasix, metolazone and also has lactulose. -Recheck basic metabolic panel within 1 week  5.  Chronic diastolic CHF-echo with diastolic dysfunction -Continue Lasix and metolazone.  6.  DVT prophylaxis-start subcutaneous heparin as patient is high risk for DVT given his immobilization from his ankle fractures. . Will need rehab at discharge.  Social worker consult appreciated -[facility]  DISCHARGE CONDITIONS:   Guarded  CONSULTS OBTAINED:   Treatment Team:  Dereck Leep, MD  DRUG ALLERGIES:   No Known Allergies DISCHARGE MEDICATIONS:   Allergies  as of 10/18/2018   No Known Allergies     Medication List    TAKE these medications   docusate sodium 100 MG capsule Commonly known as:  COLACE Take 100 mg by mouth 2 (two) times daily.   ferrous sulfate 325 (65 FE) MG tablet Take 325 mg by mouth 2 (two) times daily with a meal.   furosemide 40 MG tablet Commonly known as:  LASIX Take 1 tablet  (40 mg total) by mouth daily. What changed:  when to take this   heparin 5000 UNIT/ML injection Inject 1 mL (5,000 Units total) into the skin every 8 (eight) hours for 10 days.   HYDROcodone-acetaminophen 5-325 MG tablet Commonly known as:  NORCO/VICODIN Take 1-2 tablets by mouth every 6 (six) hours as needed for moderate pain.   insulin aspart protamine - aspart (70-30) 100 UNIT/ML FlexPen Commonly known as:  NOVOLOG 70/30 MIX Inject 84 Units into the skin daily with breakfast.   insulin aspart protamine - aspart (70-30) 100 UNIT/ML FlexPen Commonly known as:  NOVOLOG 70/30 MIX Inject 70 Units into the skin at bedtime.   lactulose 10 GM/15ML solution Commonly known as:  CHRONULAC Take 30 mLs by mouth 4 (four) times daily.   LORazepam 0.5 MG tablet Commonly known as:  ATIVAN Take 1 tablet (0.5 mg total) by mouth every 8 (eight) hours as needed for anxiety.   metolazone 2.5 MG tablet Commonly known as:  ZAROXOLYN Take 1 tablet (2.5 mg total) by mouth daily. Start taking on:  October 19, 2018 What changed:    medication strength  how much to take   multivitamin tablet Take 1 tablet by mouth daily.   omeprazole 40 MG capsule Commonly known as:  PRILOSEC Take 40 mg by mouth daily.   Potassium Chloride ER 20 MEQ Tbcr Take 20 mEq by mouth 2 (two) times daily. While on lasix' What changed:    how much to take  when to take this  additional instructions   ranitidine 150 MG capsule Commonly known as:  ZANTAC Take 150 mg by mouth 2 (two) times daily as needed for heartburn.   rifaximin 550 MG Tabs tablet Commonly known as:  XIFAXAN Take 1 tablet (550 mg total) by mouth 2 (two) times daily.        DISCHARGE INSTRUCTIONS:   1.  PCP follow-up in 1 to 2 weeks 2.  Orthopedics follow-up in 1 week 3.  Basic metabolic panel check within 1 week  DIET:   Cardiac diet and Diabetic diet  ACTIVITY:   Nonweightbearing at this time bilateral feet  OXYGEN:    Home Oxygen: No.  Oxygen Delivery: room air  DISCHARGE LOCATION:   nursing home   If you experience worsening of your admission symptoms, develop shortness of breath, life threatening emergency, suicidal or homicidal thoughts you must seek medical attention immediately by calling 911 or calling your MD immediately  if symptoms less severe.  You Must read complete instructions/literature along with all the possible adverse reactions/side effects for all the Medicines you take and that have been prescribed to you. Take any new Medicines after you have completely understood and accpet all the possible adverse reactions/side effects.   Please note  You were cared for by a hospitalist during your hospital stay. If you have any questions about your discharge medications or the care you received while you were in the hospital after you are discharged, you can call the unit and asked to speak with the hospitalist on  call if the hospitalist that took care of you is not available. Once you are discharged, your primary care physician will handle any further medical issues. Please note that NO REFILLS for any discharge medications will be authorized once you are discharged, as it is imperative that you return to your primary care physician (or establish a relationship with a primary care physician if you do not have one) for your aftercare needs so that they can reassess your need for medications and monitor your lab values.    On the day of Discharge:  VITAL SIGNS:   Blood pressure (!) 143/54, pulse 66, temperature 98.4 F (36.9 C), temperature source Oral, resp. rate 13, height 6' (1.829 m), weight 130.6 kg, SpO2 99 %.  PHYSICAL EXAMINATION:    GENERAL:  68 y.o.-year-old obese patient lying in the bed , appears to be in pain from both ankle fractures. EYES: Pupils equal, round, reactive to light and accommodation. No scleral icterus. Extraocular muscles intact.  HEENT: Head atraumatic,  normocephalic. Oropharynx and nasopharynx clear.  NECK:  Supple, no jugular venous distention. No thyroid enlargement, no tenderness.  LUNGS: Normal breath sounds bilaterally, no wheezing, rales,rhonchi or crepitation. No use of accessory muscles of respiration.  Decreased bibasilar breath sounds CARDIOVASCULAR: S1, S2 normal. No murmurs, rubs, or gallops.  ABDOMEN: Soft, obese, nontender, nondistended. Bowel sounds present. No organomegaly or mass.  EXTREMITIES: No cyanosis, or clubbing.  Both ankles immobilized in soft splints NEUROLOGIC: Cranial nerves II through XII are intact. Muscle strength 5/5 in all extremities. Sensation intact. Gait not checked.  PSYCHIATRIC: The patient is alert and oriented x 3.  SKIN: No obvious rash, lesion, or ulcer.   DATA REVIEW:   CBC Recent Labs  Lab 10/16/18 0511  WBC 9.8  HGB 9.3*  HCT 26.9*  PLT 75*    Chemistries  Recent Labs  Lab 10/15/18 2014  10/18/18 0504  NA 136   < > 137  K 3.8   < > 3.6  CL 102   < > 102  CO2 24   < > 28  GLUCOSE 334*   < > 223*  BUN 41*   < > 44*  CREATININE 2.48*   < > 2.19*  CALCIUM 9.3   < > 9.3  AST 40  --   --   ALT 28  --   --   ALKPHOS 72  --   --   BILITOT 1.0  --   --    < > = values in this interval not displayed.     Microbiology Results  Results for orders placed or performed during the hospital encounter of 07/18/18  Blood Culture (routine x 2)     Status: Abnormal   Collection Time: 07/18/18  7:58 PM  Result Value Ref Range Status   Specimen Description   Final    BLOOD BLOOD RIGHT HAND Performed at [facility], [address], [place], [place] [address]    Special Requests   Final    BOTTLES DRAWN AEROBIC AND ANAEROBIC Blood Culture results may not be optimal due to an excessive volume of blood received in culture bottles Performed at [facility], [address] [place], [place] [address]    Culture  Setup Time   Final    Organism ID to follow [place] TO, READ BACK BY AND VERIFIED WITH: LISA KLUTTZ @ 5809 ON 07/19/2018 BY CAF Performed at [facility],  [place], [place] [address]    Culture STAPHYLOCOCCUS SPECIES (COAGULASE NEGATIVE) (A)  Final   Report Status 07/22/2018 FINAL  Final   Organism ID, Bacteria STAPHYLOCOCCUS SPECIES (COAGULASE NEGATIVE)  Final      Susceptibility   Staphylococcus species (coagulase negative) - MIC*    CIPROFLOXACIN >=8 RESISTANT Resistant     ERYTHROMYCIN <=0.25 SENSITIVE Sensitive     GENTAMICIN <=0.5 SENSITIVE Sensitive     OXACILLIN >=4 RESISTANT Resistant     TETRACYCLINE <=1 SENSITIVE Sensitive     VANCOMYCIN 1 SENSITIVE Sensitive     TRIMETH/SULFA <=10 SENSITIVE Sensitive     CLINDAMYCIN <=0.25 SENSITIVE Sensitive     RIFAMPIN >=32 RESISTANT Resistant     Inducible Clindamycin NEGATIVE Sensitive     * STAPHYLOCOCCUS SPECIES (COAGULASE NEGATIVE)  Blood Culture ID Panel (Reflexed)     Status: Abnormal   Collection Time: 07/18/18  7:58 PM  Result Value Ref Range Status   Enterococcus species NOT DETECTED NOT DETECTED Final   Listeria monocytogenes NOT DETECTED NOT DETECTED Final   Staphylococcus species DETECTED (A) NOT DETECTED Final    Comment: Methicillin (oxacillin) resistant coagulase negative staphylococcus. Possible blood culture contaminant (unless isolated from more than one blood culture draw or clinical case suggests pathogenicity). No antibiotic treatment is indicated for blood  culture contaminants. CRITICAL RESULT CALLED TO, READ BACK BY AND VERIFIED WITH: LISA KLUTTZ @ 0263 ON 07/19/2018 BY CAF    Staphylococcus aureus (BCID) NOT DETECTED NOT DETECTED Final   Methicillin resistance DETECTED (A) NOT DETECTED Final    Comment: CRITICAL RESULT CALLED TO, READ BACK BY AND VERIFIED WITH: LISA KLUTTZ @ 1510 ON 07/19/18 BY CAF    Streptococcus species NOT DETECTED NOT DETECTED Final   Streptococcus agalactiae  NOT DETECTED NOT DETECTED Final   Streptococcus pneumoniae NOT DETECTED NOT DETECTED Final   Streptococcus pyogenes NOT DETECTED NOT DETECTED Final   Acinetobacter baumannii NOT DETECTED NOT DETECTED Final   Enterobacteriaceae species NOT DETECTED NOT DETECTED Final   Enterobacter cloacae complex NOT DETECTED NOT DETECTED Final   Escherichia coli NOT DETECTED NOT DETECTED Final   Klebsiella oxytoca NOT DETECTED NOT DETECTED Final   Klebsiella pneumoniae NOT DETECTED NOT DETECTED Final   Proteus species NOT DETECTED NOT DETECTED Final   Serratia marcescens NOT DETECTED NOT DETECTED Final   Haemophilus influenzae NOT DETECTED NOT DETECTED Final   Neisseria meningitidis NOT DETECTED NOT DETECTED Final   Pseudomonas aeruginosa NOT DETECTED NOT DETECTED Final   Candida albicans NOT DETECTED NOT DETECTED Final   Candida glabrata NOT DETECTED NOT DETECTED Final   Candida krusei NOT DETECTED NOT DETECTED Final   Candida parapsilosis NOT DETECTED NOT DETECTED Final   Candida tropicalis NOT DETECTED NOT DETECTED Final    Comment: Performed at [facility], [address] [place], [place] [address]  Blood Culture (routine x 2)     Status: Abnormal   Collection Time: 07/18/18  8:06 PM  Result Value Ref Range Status   Specimen Description   Final    BLOOD LEFT HAND Performed at [facility], [address] [place], [place] [address]    Special Requests   Final    NONE Performed at [facility], [address], [place], [place] [address]    Culture  Setup Time   Final    GRAM POSITIVE COCCI AEROBIC BOTTLE ONLY CRITICAL RESULT CALLED TO, READ BACK BY AND VERIFIED WITH: LISA KLUTTZ @ 1510 ON 07/19/2018 BY  CAF Performed at [facility], [place], [place] [address]    Culture (A)  Final    STAPHYLOCOCCUS SPECIES (COAGULASE NEGATIVE) SUSCEPTIBILITIES PERFORMED ON PREVIOUS CULTURE WITHIN THE LAST 5 DAYS. Performed at [place], [place] [address], [place], [place] [address]    Report Status 07/22/2018 FINAL  Final  MRSA PCR Screening     Status: None   Collection Time: 07/19/18 11:48 AM  Result Value Ref Range Status   MRSA by PCR NEGATIVE NEGATIVE Final    Comment:        The GeneXpert MRSA Assay (FDA approved for NASAL specimens only), is one component of a comprehensive MRSA colonization surveillance program. It is not intended to diagnose MRSA infection nor to guide or monitor treatment for MRSA infections. Performed at [facility], [address] [place], [place] [address]   CULTURE, BLOOD (ROUTINE X 2) w Reflex to ID Panel     Status: None   Collection Time: 07/20/18 11:34 AM  Result Value Ref Range Status   Specimen Description BLOOD LEFT ANTECUBITAL  Final   Special Requests   Final    BOTTLES DRAWN AEROBIC AND ANAEROBIC Blood Culture adequate volume   Culture   Final    NO GROWTH 5 DAYS Performed at [facility], [address] [place], [place] [address]    Report Status 07/25/2018 FINAL  Final  CULTURE, BLOOD (ROUTINE X 2) w Reflex to ID Panel     Status: None   Collection Time: 07/20/18 11:34 AM  Result Value Ref Range Status   Specimen Description BLOOD BLOOD LEFT HAND  Final   Special Requests   Final    BOTTLES DRAWN AEROBIC AND ANAEROBIC Blood Culture adequate volume   Culture   Final    NO GROWTH 5 DAYS Performed at [facility], [address], [place],  [address]    Report Status 07/25/2018 FINAL  Final    RADIOLOGY:  No results found.   Management plans discussed with the patient, family and they are in agreement.  CODE STATUS:     Code Status Orders  (From admission, onward)         Start     Ordered   10/16/18 0055  Full code  Continuous     10/16/18 0054        Code Status History    Date Active Date Inactive Code Status Order ID Comments User Context   07/18/2018 2339 07/21/2018 1356 Full Code [id number]  Lance Coon, MD Inpatient   06/18/2018 1609 06/19/2018 1717 Full Code [id number]  Epifanio Lesches, MD ED   12/31/2017 1856 01/01/2018 1205 Full Code [id number]  Saundra Shelling, MD Inpatient   12/25/2017 1456 12/26/2017 1618 Full Code [id number]  Epifanio Lesches, MD ED   12/16/2017 0921 12/18/2017 1635 DNR [id number]  Max Sane, MD Inpatient   12/16/2017 0305 12/16/2017 0921 Full Code [id number]  Salary, Avel Peace, MD ED   12/02/2017 2003 12/05/2017 2000 Full Code [id number]  Saundra Shelling, MD Inpatient   11/21/2017 1948 11/23/2017 1958 Full Code [id number]  Loletha Grayer, MD ED   10/23/2017 1405 10/26/2017 1402 Full Code [id number]  Bettey Costa, MD Inpatient   10/18/2017 0058 10/19/2017 1806 Full Code [id number]  Saundra Shelling, MD Inpatient   10/09/2017 2139 10/11/2017 1806 Full Code [id number]  Corey Basta, MD Inpatient   09/03/2017 2124 09/05/2017 1552 Full Code [id number]  Nicholes [place], MD Inpatient   06/26/2017 1119 06/28/2017  [place] Full Code [id number]  Henreitta Leber, MD Inpatient   02/21/2017 2007 02/22/2017 1803 Full Code [id number]  Corey Basta, MD Inpatient   07/20/2015 1245 07/22/2015 2217 Full Code [id number]  Nicholes [place], MD Inpatient   02/08/2015 2331 02/11/2015 1943 Full Code [id number]  Hower, Aaron Mose, MD ED      TOTAL TIME TAKING CARE OF THIS PATIENT: 38 minutes.    Gladstone Lighter M.D on 10/18/2018 at 11:02 AM  Between 7am to 6pm - Pager - [phone]  After 6pm go to [email or web address] - Proofreader  [facility]  Office  [phone]  CC: Primary care physician; Corey Hire, MD   Note: This dictation was prepared with Dragon dictation along with smaller phrase technology. Any transcriptional errors that result from this process are unintentional.

## 2018-10-18 NOTE — Clinical Social Work Note (Signed)
The patient will discharge today to [facility] via non-emergent EMS. The patient, his family, and the facility are aware and in agreement. The CSW has sent all discharge documentation and delivered the discharge packet. The CSW is signing off. Please consult should needs arise.  Santiago Bumpers, MSW, Latanya Presser [phone]

## 2018-10-18 NOTE — Progress Notes (Addendum)
Patient is being discharged to [facility] room 504. Report called to [facility]. IAVS printed, V's removed, NT prepared patient for transport via EMS. Belongings packed. EMS called.

## 2018-10-22 NOTE — Progress Notes (Signed)
10/22/18: Clinical Social Worker ([place]) received a call from [place] [facility] worker Peggye Form today stating that APS opened a case for patient. Per Margreta Journey she came to visit patient while he was in the hospital last week. CSW made APS worker aware that patient discharged [facility] to [facility] 10[facility]18/[facility].   [employer], LCSW [phone]

## 2018-10-28 ENCOUNTER — Inpatient Hospital Stay: Payer: Medicare Other | Attending: Oncology

## 2018-10-28 ENCOUNTER — Inpatient Hospital Stay: Payer: Medicare Other

## 2018-11-08 ENCOUNTER — Other Ambulatory Visit
Admission: RE | Admit: 2018-11-08 | Discharge: 2018-11-08 | Disposition: A | Discharge status: Home / Self Care | Payer: Medicare Other | Source: Ambulatory Visit | Attending: Family Medicine | Admitting: Family Medicine

## 2018-11-08 DIAGNOSIS — R197 Diarrhea, unspecified: Secondary | ICD-10-CM | POA: Diagnosis present

## 2018-11-08 LAB — GASTROINTESTINAL PANEL BY PCR, STOOL (REPLACES STOOL CULTURE)

## 2018-11-08 LAB — C DIFFICILE QUICK SCREEN W PCR REFLEX
C Diff antigen: POSITIVE — AB
C Diff interpretation: DETECTED
C Diff toxin: POSITIVE — AB

## 2018-12-02 ENCOUNTER — Inpatient Hospital Stay
Admission: EM | Admit: 2018-12-02 | Discharge: 2018-12-09 | DRG: 372 | Disposition: A | Discharge status: Home Health | Payer: Medicare Other | Source: Skilled Nursing Facility | Attending: Internal Medicine | Admitting: Internal Medicine

## 2018-12-02 ENCOUNTER — Inpatient Hospital Stay: Payer: Medicare Other

## 2018-12-02 ENCOUNTER — Encounter: Payer: Self-pay | Admitting: Emergency Medicine

## 2018-12-02 ENCOUNTER — Other Ambulatory Visit
Admission: RE | Admit: 2018-12-02 | Discharge: 2018-12-02 | Disposition: A | Discharge status: Home / Self Care | Payer: Medicare Other | Source: Ambulatory Visit | Attending: Family Medicine | Admitting: Family Medicine

## 2018-12-02 ENCOUNTER — Other Ambulatory Visit: Payer: Self-pay

## 2018-12-02 DIAGNOSIS — F419 Anxiety disorder, unspecified: Secondary | ICD-10-CM | POA: Diagnosis present

## 2018-12-02 DIAGNOSIS — E876 Hypokalemia: Secondary | ICD-10-CM | POA: Diagnosis present

## 2018-12-02 DIAGNOSIS — K219 Gastro-esophageal reflux disease without esophagitis: Secondary | ICD-10-CM | POA: Diagnosis present

## 2018-12-02 DIAGNOSIS — Z794 Long term (current) use of insulin: Secondary | ICD-10-CM

## 2018-12-02 DIAGNOSIS — Z833 Family history of diabetes mellitus: Secondary | ICD-10-CM | POA: Diagnosis not present

## 2018-12-02 DIAGNOSIS — F329 Major depressive disorder, single episode, unspecified: Secondary | ICD-10-CM | POA: Diagnosis present

## 2018-12-02 DIAGNOSIS — E669 Obesity, unspecified: Secondary | ICD-10-CM | POA: Diagnosis present

## 2018-12-02 DIAGNOSIS — Z79891 Long term (current) use of opiate analgesic: Secondary | ICD-10-CM

## 2018-12-02 DIAGNOSIS — N184 Chronic kidney disease, stage 4 (severe): Secondary | ICD-10-CM | POA: Diagnosis present

## 2018-12-02 DIAGNOSIS — K2270 Barrett's esophagus without dysplasia: Secondary | ICD-10-CM | POA: Diagnosis present

## 2018-12-02 DIAGNOSIS — G4730 Sleep apnea, unspecified: Secondary | ICD-10-CM | POA: Diagnosis present

## 2018-12-02 DIAGNOSIS — R197 Diarrhea, unspecified: Secondary | ICD-10-CM | POA: Diagnosis present

## 2018-12-02 DIAGNOSIS — Z8249 Family history of ischemic heart disease and other diseases of the circulatory system: Secondary | ICD-10-CM | POA: Diagnosis not present

## 2018-12-02 DIAGNOSIS — Z8619 Personal history of other infectious and parasitic diseases: Secondary | ICD-10-CM

## 2018-12-02 DIAGNOSIS — Z79899 Other long term (current) drug therapy: Secondary | ICD-10-CM | POA: Diagnosis not present

## 2018-12-02 DIAGNOSIS — F17290 Nicotine dependence, other tobacco product, uncomplicated: Secondary | ICD-10-CM | POA: Diagnosis present

## 2018-12-02 DIAGNOSIS — A0472 Enterocolitis due to Clostridium difficile, not specified as recurrent: Secondary | ICD-10-CM | POA: Diagnosis present

## 2018-12-02 DIAGNOSIS — E1122 Type 2 diabetes mellitus with diabetic chronic kidney disease: Secondary | ICD-10-CM | POA: Diagnosis present

## 2018-12-02 DIAGNOSIS — E785 Hyperlipidemia, unspecified: Secondary | ICD-10-CM | POA: Diagnosis present

## 2018-12-02 DIAGNOSIS — Z6838 Body mass index (BMI) 38.0-38.9, adult: Secondary | ICD-10-CM | POA: Diagnosis not present

## 2018-12-02 DIAGNOSIS — K7460 Unspecified cirrhosis of liver: Secondary | ICD-10-CM | POA: Diagnosis present

## 2018-12-02 DIAGNOSIS — E440 Moderate protein-calorie malnutrition: Secondary | ICD-10-CM

## 2018-12-02 DIAGNOSIS — I129 Hypertensive chronic kidney disease with stage 1 through stage 4 chronic kidney disease, or unspecified chronic kidney disease: Secondary | ICD-10-CM | POA: Diagnosis present

## 2018-12-02 DIAGNOSIS — E860 Dehydration: Secondary | ICD-10-CM

## 2018-12-02 LAB — URINALYSIS, COMPLETE (UACMP) WITH MICROSCOPIC
Bacteria, UA: NONE SEEN
Bilirubin Urine: NEGATIVE
Glucose, UA: NEGATIVE mg/dL
Hgb urine dipstick: NEGATIVE
Ketones, ur: NEGATIVE mg/dL
Leukocytes,Ua: NEGATIVE
Nitrite: NEGATIVE
PROTEIN: NEGATIVE mg/dL
Specific Gravity, Urine: 1.006 (ref 1.005–1.030)
pH: 5 (ref 5.0–8.0)

## 2018-12-02 LAB — MAGNESIUM: Magnesium: 1.6 mg/dL — ABNORMAL LOW (ref 1.7–2.4)

## 2018-12-02 LAB — COMPREHENSIVE METABOLIC PANEL
ALT: 18 U/L (ref 0–44)
AST: 27 U/L (ref 15–41)
Albumin: 2.5 g/dL — ABNORMAL LOW (ref 3.5–5.0)
Alkaline Phosphatase: 76 U/L (ref 38–126)
Anion gap: 9 (ref 5–15)
BUN: 38 mg/dL — AB (ref 8–23)
CO2: 25 mmol/L (ref 22–32)
Calcium: 7.9 mg/dL — ABNORMAL LOW (ref 8.9–10.3)
Chloride: 96 mmol/L — ABNORMAL LOW (ref 98–111)
Creatinine, Ser: 2.42 mg/dL — ABNORMAL HIGH (ref 0.61–1.24)
GFR calc Af Amer: 31 mL/min — ABNORMAL LOW (ref >60)
GFR, EST NON AFRICAN AMERICAN: 27 mL/min — AB (ref >60)
Glucose, Bld: 123 mg/dL — ABNORMAL HIGH (ref 70–99)
Potassium: 2.9 mmol/L — ABNORMAL LOW (ref 3.5–5.1)
Sodium: 130 mmol/L — ABNORMAL LOW (ref 135–145)
Total Bilirubin: 0.6 mg/dL (ref 0.3–1.2)
Total Protein: 5.7 g/dL — ABNORMAL LOW (ref 6.5–8.1)

## 2018-12-02 LAB — CBC
HCT: 30.4 % — ABNORMAL LOW (ref 39.0–52.0)
Hemoglobin: 10.4 g/dL — ABNORMAL LOW (ref 13.0–17.0)
MCH: 29.4 pg (ref 26.0–34.0)
MCHC: 34.2 g/dL (ref 30.0–36.0)
MCV: 85.9 fL (ref 80.0–100.0)
Platelets: 134 10*3/uL — ABNORMAL LOW (ref 150–400)
RBC: 3.54 MIL/uL — ABNORMAL LOW (ref 4.22–5.81)
RDW: 14.2 % (ref 11.5–15.5)
WBC: 13.8 10*3/uL — AB (ref 4.0–10.5)
nRBC: 0 % (ref 0.0–0.2)

## 2018-12-02 LAB — C DIFFICILE QUICK SCREEN W PCR REFLEX
C Diff antigen: POSITIVE — AB
C Diff interpretation: DETECTED
C Diff toxin: POSITIVE — AB

## 2018-12-02 LAB — LIPASE, BLOOD: Lipase: 24 U/L (ref 11–51)

## 2018-12-02 LAB — GLUCOSE, CAPILLARY: Glucose-Capillary: 99 mg/dL (ref 70–99)

## 2018-12-02 LAB — HEMOGLOBIN A1C
Hgb A1c MFr Bld: 6.8 % — ABNORMAL HIGH (ref 4.8–5.6)
Mean Plasma Glucose: 148.46 mg/dL

## 2018-12-02 LAB — AMMONIA: Ammonia: 34 umol/L (ref 9–35)

## 2018-12-02 MED ORDER — MAGNESIUM SULFATE 2 GM/50ML IV SOLN
2.0000 g | Freq: Once | INTRAVENOUS | Status: AC
  Administered 2018-12-02: 2 g via INTRAVENOUS
  Filled 2018-12-02: qty 50

## 2018-12-02 MED ORDER — ALUM & MAG HYDROXIDE-SIMETH 200-200-20 MG/5ML PO SUSP
30.0000 mL | ORAL | Status: DC | PRN
  Administered 2018-12-05 – 2018-12-08 (×3): 30 mL via ORAL
  Filled 2018-12-02 (×4): qty 30

## 2018-12-02 MED ORDER — POTASSIUM CHLORIDE CRYS ER 10 MEQ PO TBCR
20.0000 meq | EXTENDED_RELEASE_TABLET | Freq: Two times a day (BID) | ORAL | Status: AC
  Administered 2018-12-03 – 2018-12-04 (×4): 20 meq via ORAL
  Filled 2018-12-02 (×4): qty 2

## 2018-12-02 MED ORDER — POTASSIUM CHLORIDE IN NACL 40-0.9 MEQ/L-% IV SOLN
INTRAVENOUS | Status: AC
  Administered 2018-12-02 (×2): 100 mL/h via INTRAVENOUS
  Filled 2018-12-02 (×2): qty 1000

## 2018-12-02 MED ORDER — ACETAMINOPHEN 325 MG PO TABS: 650.0000 mg | ORAL_TABLET | Freq: Four times a day (QID) | ORAL | Status: DC | PRN

## 2018-12-02 MED ORDER — INSULIN ASPART PROT & ASPART (70-30 MIX) 100 UNIT/ML ~~LOC~~ SUSP
50.0000 [IU] | Freq: Every day | SUBCUTANEOUS | Status: DC
  Administered 2018-12-03: 50 [IU] via SUBCUTANEOUS
  Filled 2018-12-02 (×2): qty 10

## 2018-12-02 MED ORDER — ONDANSETRON HCL 4 MG/2ML IJ SOLN: 4.0000 mg | Freq: Four times a day (QID) | INTRAMUSCULAR | Status: DC | PRN

## 2018-12-02 MED ORDER — ACETAMINOPHEN 650 MG RE SUPP: 650.0000 mg | Freq: Four times a day (QID) | RECTAL | Status: DC | PRN

## 2018-12-02 MED ORDER — HYDROCODONE-ACETAMINOPHEN 5-325 MG PO TABS
1.0000 | ORAL_TABLET | Freq: Four times a day (QID) | ORAL | Status: DC | PRN
  Administered 2018-12-09 (×2): 2 via ORAL
  Filled 2018-12-02 (×2): qty 2

## 2018-12-02 MED ORDER — ENOXAPARIN SODIUM 40 MG/0.4ML ~~LOC~~ SOLN
40.0000 mg | SUBCUTANEOUS | Status: DC
  Administered 2018-12-02 – 2018-12-08 (×7): 40 mg via SUBCUTANEOUS
  Filled 2018-12-02 (×6): qty 0.4

## 2018-12-02 MED ORDER — SACCHAROMYCES BOULARDII 250 MG PO CAPS
250.0000 mg | ORAL_CAPSULE | Freq: Two times a day (BID) | ORAL | Status: DC
  Administered 2018-12-02 – 2018-12-09 (×14): 250 mg via ORAL
  Filled 2018-12-02 (×16): qty 1

## 2018-12-02 MED ORDER — FERROUS SULFATE 325 (65 FE) MG PO TABS
325.0000 mg | ORAL_TABLET | Freq: Two times a day (BID) | ORAL | Status: DC
  Administered 2018-12-03 – 2018-12-09 (×13): 325 mg via ORAL
  Filled 2018-12-02 (×12): qty 1

## 2018-12-02 MED ORDER — ALBUTEROL SULFATE (2.5 MG/3ML) 0.083% IN NEBU: 2.5000 mg | INHALATION_SOLUTION | RESPIRATORY_TRACT | Status: DC | PRN

## 2018-12-02 MED ORDER — SODIUM CHLORIDE 0.9 % IV BOLUS
500.0000 mL | Freq: Once | INTRAVENOUS | Status: AC
  Administered 2018-12-02: 500 mL via INTRAVENOUS

## 2018-12-02 MED ORDER — ONDANSETRON HCL 4 MG PO TABS: 4.0000 mg | ORAL_TABLET | Freq: Four times a day (QID) | ORAL | Status: DC | PRN

## 2018-12-02 MED ORDER — INSULIN ASPART PROT & ASPART (70-30 MIX) 100 UNIT/ML ~~LOC~~ SUSP
70.0000 [IU] | Freq: Every day | SUBCUTANEOUS | Status: DC
  Administered 2018-12-04 – 2018-12-05 (×2): 70 [IU] via SUBCUTANEOUS
  Filled 2018-12-02 (×3): qty 10

## 2018-12-02 MED ORDER — INSULIN ASPART 100 UNIT/ML ~~LOC~~ SOLN
0.0000 [IU] | Freq: Every day | SUBCUTANEOUS | Status: DC
  Administered 2018-12-03 – 2018-12-04 (×2): 2 [IU] via SUBCUTANEOUS
  Administered 2018-12-06: 3 [IU] via SUBCUTANEOUS
  Filled 2018-12-02 (×3): qty 1

## 2018-12-02 MED ORDER — INSULIN ASPART 100 UNIT/ML ~~LOC~~ SOLN
0.0000 [IU] | Freq: Three times a day (TID) | SUBCUTANEOUS | Status: DC
  Administered 2018-12-03: 5 [IU] via SUBCUTANEOUS
  Administered 2018-12-04: 3 [IU] via SUBCUTANEOUS
  Administered 2018-12-05: 2 [IU] via SUBCUTANEOUS
  Administered 2018-12-05: 1 [IU] via SUBCUTANEOUS
  Administered 2018-12-05: 2 [IU] via SUBCUTANEOUS
  Administered 2018-12-06: 3 [IU] via SUBCUTANEOUS
  Administered 2018-12-06: 2 [IU] via SUBCUTANEOUS
  Administered 2018-12-07 – 2018-12-08 (×4): 3 [IU] via SUBCUTANEOUS
  Administered 2018-12-08 (×2): 2 [IU] via SUBCUTANEOUS
  Filled 2018-12-02 (×14): qty 1

## 2018-12-02 MED ORDER — POTASSIUM CHLORIDE CRYS ER 20 MEQ PO TBCR
40.0000 meq | EXTENDED_RELEASE_TABLET | ORAL | Status: AC
  Administered 2018-12-02 (×2): 40 meq via ORAL
  Filled 2018-12-02 (×2): qty 2

## 2018-12-02 MED ORDER — ZINC SULFATE 220 (50 ZN) MG PO CAPS
220.0000 mg | ORAL_CAPSULE | Freq: Every day | ORAL | Status: DC
  Administered 2018-12-02 – 2018-12-09 (×8): 220 mg via ORAL
  Filled 2018-12-02 (×8): qty 1

## 2018-12-02 MED ORDER — RIFAXIMIN 550 MG PO TABS
550.0000 mg | ORAL_TABLET | Freq: Two times a day (BID) | ORAL | Status: DC
  Administered 2018-12-02 – 2018-12-09 (×14): 550 mg via ORAL
  Filled 2018-12-02 (×15): qty 1

## 2018-12-02 MED ORDER — SODIUM CHLORIDE 0.9% FLUSH
3.0000 mL | Freq: Two times a day (BID) | INTRAVENOUS | Status: DC
  Administered 2018-12-03 – 2018-12-08 (×13): 3 mL via INTRAVENOUS

## 2018-12-02 MED ORDER — VANCOMYCIN 50 MG/ML ORAL SOLUTION
125.0000 mg | Freq: Four times a day (QID) | ORAL | Status: DC
  Administered 2018-12-02 – 2018-12-09 (×28): 125 mg via ORAL
  Filled 2018-12-02 (×31): qty 2.5

## 2018-12-02 MED ORDER — PANTOPRAZOLE SODIUM 40 MG PO TBEC
40.0000 mg | DELAYED_RELEASE_TABLET | Freq: Every day | ORAL | Status: DC
  Administered 2018-12-03 – 2018-12-08 (×6): 40 mg via ORAL
  Filled 2018-12-02 (×6): qty 1

## 2018-12-02 MED ORDER — LORAZEPAM 0.5 MG PO TABS
0.5000 mg | ORAL_TABLET | Freq: Three times a day (TID) | ORAL | Status: DC | PRN
  Administered 2018-12-03 – 2018-12-09 (×12): 0.5 mg via ORAL
  Filled 2018-12-02 (×12): qty 1

## 2018-12-02 NOTE — ED Provider Notes (Signed)
[facility] Emergency Department Provider Note   ____________________________________________   First MD Initiated Contact with Patient 12/02/18 1622     (approximate)  I have reviewed the triage vital signs and the nursing notes.   HISTORY  Chief Complaint Diarrhea    HPI Corey West is a 68 y.o. male here for evaluation for loose stools  Patient reports that he started having multiple loose stools yesterday.  Is been uncontrollable in nature.  Is not painful.  No vomiting.  Reports he cannot seem to keep from stooling.  He also fell like he was getting a little confused today  Does have a history of C. difficile.  His doctor talked with him today at the nursing home and he felt he needed to come to the ER for further evaluation and worsening uncontrollable diarrhea.   Past Medical History:  Diagnosis Date  . Anemia   . Anxiety    controlled;   . Arthritis   . AVM (arteriovenous malformation) of stomach, acquired with hemorrhage   . Barrett's esophagus   . Chronic kidney disease    renal infufficiency  . Cirrhosis ([place])   . Depression    controlled;   Marland Kitchen Diabetes mellitus without complication ([place])    not controlled, taking insulin but sugar continues to run high;   . Edema   . Esophageal varices ([place])   . GAVE (gastric antral vascular ectasia)   . GERD (gastroesophageal reflux disease)   . History of hiatal hernia   . Hyperlipidemia   . Hypertension    controlled well;   . 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 Obesity   . Pancytopenia ([place])   . Polyp, stomach    with chronic blood loss  . Sleep apnea    does not wear a cpap, Medicare would not pay for it   . Venous stasis dermatitis of both lower extremities   . Venous stasis of both lower extremities    with cellulitis    Patient Active Problem List   Diagnosis Date Noted  . Ankle fracture, left, closed, initial encounter 10/15/2018  . Ankle fracture, right, closed, initial  encounter 10/15/2018  . Ankle fracture 10/15/2018  . Pressure injury of skin 07/19/2018  . UTI (urinary tract infection) 07/18/2018  . Goals of care, counseling/discussion   . Palliative care encounter   . Hepatic encephalopathy syndrome ([place]) 12/16/2017  . Hepatic encephalopathy ([place]) 10/18/2017  . Hypokalemia 10/09/2017  . Hyperglycemia 10/09/2017  . Symptomatic anemia 06/26/2017  . Sepsis ([address]) 06/12/2017  . Cellulitis in diabetic foot ([place]) 02/21/2017  . Uncontrolled diabetes mellitus ([place]) 02/21/2017  . Cellulitis 02/21/2017  . GI bleed 07/20/2015  . Hematemesis 02/08/2015  . Cirrhosis ([place]) 02/08/2015  . Esophageal varices ([place]) 02/08/2015  . Type 2 diabetes mellitus ([place]) 02/08/2015  . Gastroesophageal reflux disease 02/08/2015  . Hyperlipidemia 02/08/2015  . Iron deficiency anemia 12/04/2013  . Thrombocytopenia ([place]) 12/04/2013  . Splenomegaly 11/04/2013    Past Surgical History:  Procedure Laterality Date  . ESOPHAGOGASTRODUODENOSCOPY N/A 02/09/2015   Procedure: ESOPHAGOGASTRODUODENOSCOPY (EGD);  Surgeon: Manya Silvas, MD;  Location: [facility] ENDOSCOPY;  Service: Endoscopy;  Laterality: N/A;  . ESOPHAGOGASTRODUODENOSCOPY N/A 07/22/2015   Procedure: ESOPHAGOGASTRODUODENOSCOPY (EGD);  Surgeon: Manya Silvas, MD;  Location: [facility] ENDOSCOPY;  Service: Endoscopy;  Laterality: N/A;  . ESOPHAGOGASTRODUODENOSCOPY N/A 06/26/2017   Procedure: ESOPHAGOGASTRODUODENOSCOPY (EGD);  Surgeon: Manya Silvas, MD;  Location: [facility] ENDOSCOPY;  Service: Endoscopy;  Laterality: N/A;  . ESOPHAGOGASTRODUODENOSCOPY N/A  06/27/2017   Procedure: ESOPHAGOGASTRODUODENOSCOPY (EGD);  Surgeon: Manya Silvas, MD;  Location: [facility] ENDOSCOPY;  Service: Endoscopy;  Laterality: N/A;  . ESOPHAGOGASTRODUODENOSCOPY N/A 06/28/2017   Procedure: ESOPHAGOGASTRODUODENOSCOPY (EGD);  Surgeon: Manya Silvas, MD;  Location[facility] ENDOSCOPY;  Service: Endoscopy;  Laterality: N/A;  . ESOPHAGOGASTRODUODENOSCOPY  Bilateral 10/11/2017   Procedure: ESOPHAGOGASTRODUODENOSCOPY (EGD);  Surgeon: Manya Silvas, MD;  Location: [facility] ENDOSCOPY;  Service: Endoscopy;  Laterality: Bilateral;  . ESOPHAGOGASTRODUODENOSCOPY N/A 12/04/2017   Procedure: ESOPHAGOGASTRODUODENOSCOPY (EGD);  Surgeon: Manya Silvas, MD;  Location: [facility] ENDOSCOPY;  Service: Endoscopy;  Laterality: N/A;  . ESOPHAGOGASTRODUODENOSCOPY (EGD) WITH PROPOFOL N/A 07/20/2015   Procedure: ESOPHAGOGASTRODUODENOSCOPY (EGD) WITH PROPOFOL;  Surgeon: Manya Silvas, MD;  Location: [facility] ENDOSCOPY;  Service: Endoscopy;  Laterality: N/A;  . ESOPHAGOGASTRODUODENOSCOPY (EGD) WITH PROPOFOL N/A 09/16/2015   Procedure: ESOPHAGOGASTRODUODENOSCOPY (EGD) WITH PROPOFOL;  Surgeon: Manya Silvas, MD;  Location: [facility] ENDOSCOPY;  Service: Endoscopy;  Laterality: N/A;  . ESOPHAGOGASTRODUODENOSCOPY (EGD) WITH PROPOFOL N/A 03/16/2016   Procedure: ESOPHAGOGASTRODUODENOSCOPY (EGD) WITH PROPOFOL;  Surgeon: Manya Silvas, MD;  Location: [facility] ENDOSCOPY;  Service: Endoscopy;  Laterality: N/A;  . ESOPHAGOGASTRODUODENOSCOPY (EGD) WITH PROPOFOL N/A 09/14/2016   Procedure: ESOPHAGOGASTRODUODENOSCOPY (EGD) WITH PROPOFOL;  Surgeon: Manya Silvas, MD;  Location: [facility] ENDOSCOPY;  Service: Endoscopy;  Laterality: N/A;  . ESOPHAGOGASTRODUODENOSCOPY (EGD) WITH PROPOFOL N/A 11/05/2016   Procedure: ESOPHAGOGASTRODUODENOSCOPY (EGD) WITH PROPOFOL;  Surgeon: Manya Silvas, MD;  Location: [facility] ENDOSCOPY;  Service: Endoscopy;  Laterality: N/A;  . ESOPHAGOGASTRODUODENOSCOPY (EGD) WITH PROPOFOL N/A 02/06/2017   Procedure: ESOPHAGOGASTRODUODENOSCOPY (EGD) WITH PROPOFOL;  Surgeon: Manya Silvas, MD;  Location: [facility] ENDOSCOPY;  Service: Endoscopy;  Laterality: N/A;  . ESOPHAGOGASTRODUODENOSCOPY (EGD) WITH PROPOFOL N/A 04/24/2017   Procedure: ESOPHAGOGASTRODUODENOSCOPY (EGD) WITH PROPOFOL;  Surgeon: Manya Silvas, MD;  Location: [facility] ENDOSCOPY;  Service: Endoscopy;  Laterality: N/A;  .  ESOPHAGOGASTRODUODENOSCOPY (EGD) WITH PROPOFOL N/A 07/31/2017   Procedure: ESOPHAGOGASTRODUODENOSCOPY (EGD) WITH PROPOFOL;  Surgeon: Manya Silvas, MD;  Location: [facility] ENDOSCOPY;  Service: Endoscopy;  Laterality: N/A;  . ESOPHAGOGASTRODUODENOSCOPY (EGD) WITH PROPOFOL N/A 08/08/2018   Procedure: ESOPHAGOGASTRODUODENOSCOPY (EGD) WITH PROPOFOL;  Surgeon: Manya Silvas, MD;  Location: [facility] ENDOSCOPY;  Service: Endoscopy;  Laterality: N/A;  . GIVENS CAPSULE STUDY N/A 12/05/2017   Procedure: GIVENS CAPSULE STUDY;  Surgeon: Manya Silvas, MD;  Location: [facility] ENDOSCOPY;  Service: Endoscopy;  Laterality: N/A;  . TONSILLECTOMY    . TONSILLECTOMY AND ADENOIDECTOMY    . ULNAR NERVE TRANSPOSITION    . UVULOPALATOPHARYNGOPLASTY      Prior to Admission medications   Medication Sig Start Date End Date Taking? Authorizing Provider  docusate sodium (COLACE) 100 MG capsule Take 100 mg by mouth 2 (two) times daily.    [provider]  ferrous sulfate 325 (65 FE) MG tablet Take 325 mg by mouth 2 (two) times daily with a meal.     [provider]  furosemide (LASIX) 40 MG tablet Take 1 tablet (40 mg total) by mouth daily. 10/18/18   Gladstone Lighter, MD  heparin 5000 UNIT/ML injection Inject 1 mL (5,000 Units total) into the skin every 8 (eight) hours for 10 days. 10/18/18 10/28/18  Gladstone Lighter, MD  HYDROcodone-acetaminophen (NORCO/VICODIN) 5-325 MG tablet Take 1-2 tablets by mouth every 6 (six) hours as needed for moderate pain. 10/18/18   Gladstone Lighter, MD  insulin aspart protamine - aspart (NOVOLOG 70/30 MIX) (70-30) 100 UNIT/ML FlexPen Inject 84 Units into the skin daily with breakfast.  [provider]  insulin aspart protamine - aspart (NOVOLOG 70/30 MIX) (70-30) 100 UNIT/ML FlexPen Inject 70 Units into the skin at bedtime.     [provider]  lactulose (CHRONULAC) 10 GM/15ML solution Take 30 mLs by mouth 4 (four) times daily.  07/04/18   [provider]  LORazepam (ATIVAN) 0.5 MG tablet Take 1 tablet (0.5 mg total) by mouth every 8 (eight) hours as needed for anxiety. 10/18/18   Gladstone Lighter, MD  metolazone (ZAROXOLYN) 2.5 MG tablet Take 1 tablet (2.5 mg total) by mouth daily. 10/19/18   Gladstone Lighter, MD  Multiple Vitamin (MULTIVITAMIN) tablet Take 1 tablet by mouth daily.    [provider]  omeprazole (PRILOSEC) 40 MG capsule Take 40 mg by mouth daily.    [provider]  Potassium Chloride ER 20 MEQ TBCR Take 20 mEq by mouth 2 (two) times daily. While on lasix' 10/18/18   Gladstone Lighter, MD  ranitidine (ZANTAC) 150 MG capsule Take 150 mg by mouth 2 (two) times daily as needed for heartburn.     [provider]  rifaximin (XIFAXAN) 550 MG TABS tablet Take 1 tablet (550 mg total) by mouth 2 (two) times daily. Patient not taking: Reported on 10/16/2018 01/01/18   Loletha Grayer, MD    Allergies Patient has no known allergies.  Family History  Problem Relation Age of Onset  . Diabetes Other   . Transient ischemic attack Father   . CAD Father     Social History Social History   Tobacco Use  . Smoking status: Current Some Day Smoker    Types: Cigars  . Smokeless tobacco: Never Used  . Tobacco comment: smoke 1 at night occasionally;   Substance Use Topics  . Alcohol use: No    Comment: stopped 12 years ago  . Drug use: No    Review of Systems Constitutional: No fever/chills Eyes: No visual changes. ENT: No sore throat. Cardiovascular: Denies chest pain. Respiratory: Denies shortness of breath. Gastrointestinal: No abdominal pain.  Uncontrollable diarrhea for about a day and a half. Genitourinary: Negative for dysuria. Musculoskeletal: Negative for back pain. Skin: Negative for rash. Neurological: Negative for headaches, areas of focal weakness or numbness.    ____________________________________________   PHYSICAL EXAM:  VITAL SIGNS: ED Triage Vitals  Enc  Vitals Group     BP 12/02/18 1505 (!) 127/52     Pulse Rate 12/02/18 1505 78     Resp 12/02/18 1505 17     Temp 12/02/18 1505 98.6 F (37 C)     Temp Source 12/02/18 1505 Oral     SpO2 12/02/18 1505 96 %     Weight 12/02/18 1506 274 lb (124.3 kg)     Height 12/02/18 1506 5\' 10"  (1.778 m)     Head Circumference --      Peak Flow --      Pain Score 12/02/18 1506 0     Pain Loc --      Pain Edu? --      Excl. in [place]? --     Constitutional: Alert and oriented.  Appears mildly fatigued but in no distress.  Eyes: Conjunctivae are normal. Head: Atraumatic. Nose: No congestion/rhinnorhea. Mouth/Throat: Mucous membranes are quite dry. Neck: No stridor.  Cardiovascular: Normal rate, regular rhythm. Grossly normal heart sounds.  Good peripheral circulation. Respiratory: Normal respiratory effort.  No retractions. Lungs CTAB. Gastrointestinal: Soft and nontender. No distention.  No rebound or guarding in any quadrant. Musculoskeletal: No lower  extremity tenderness nor edema. Neurologic:  Normal speech and language. No gross focal neurologic deficits are appreciated.  Skin:  Skin is warm, dry and intact. No rash noted. Psychiatric: Mood and affect are normal. Speech and behavior are normal.  ____________________________________________   LABS (all labs ordered are listed, but only abnormal results are displayed)  Labs Reviewed  COMPREHENSIVE METABOLIC PANEL - Abnormal; Notable for the following components:      Result Value   Sodium 130 (*)    Potassium 2.9 (*)    Chloride 96 (*)    Glucose, Bld 123 (*)    BUN 38 (*)    Creatinine, Ser 2.42 (*)    Calcium 7.9 (*)    Total Protein 5.7 (*)    Albumin 2.5 (*)    GFR calc non Af Amer 27 (*)    GFR calc Af Amer 31 (*)    All other components within normal limits  CBC - Abnormal; Notable for the following components:   WBC 13.8 (*)    RBC 3.54 (*)    Hemoglobin 10.4 (*)    HCT 30.4 (*)    Platelets 134 (*)    All other components  within normal limits  URINALYSIS, COMPLETE (UACMP) WITH MICROSCOPIC - Abnormal; Notable for the following components:   Color, Urine YELLOW (*)    APPearance CLEAR (*)    All other components within normal limits  GASTROINTESTINAL PANEL BY PCR, STOOL (REPLACES STOOL CULTURE)  C DIFFICILE QUICK SCREEN W PCR REFLEX  LIPASE, BLOOD  AMMONIA   ____________________________________________  EKG   ____________________________________________  RADIOLOGY  Dg Abd 2 Views  Result Date: 12/02/2018 CLINICAL DATA:  Diarrhea for several days. Positive C difficile. Evaluation for megacolon. EXAM: ABDOMEN - 2 VIEW COMPARISON:  CT AP 02/21/2017 FINDINGS: Nonobstructed, nondistended bowel gas pattern. No free air. No organomegaly is noted. Two rounded calcifications seen in the right upper quadrant may reflect the patient's known gallstones. Gentle levoconvex curvature of the lumbar spine with degenerative disc disease and endplate spurring. IMPRESSION: Nonspecific nonobstructive bowel gas pattern. No significant small nor large bowel dilatation is seen. Cholelithiasis. Electronically Signed   By: Ashley Royalty M.D.   On: 12/02/2018 18:16     ____________________________________________   PROCEDURES  Procedure(s) performed: None  Procedures  Critical Care performed: No  ____________________________________________   INITIAL IMPRESSION / ASSESSMENT AND PLAN / ED COURSE  Pertinent labs & imaging results that were available during my care of the patient were reviewed by me and considered in my medical decision making (see chart for details).   Patient presents for evaluation of uncontrollable loose stools.  He has recently been treated for Clostridium difficile, and notes in the computer indicate he had a resultant positive C. difficile once again.  His clinical history does not suggest peritonitis, he does have elevated white count and I suspect he is likely redeveloped Clostridium and his  outpatient notes suggest positive on testing.  Will reinitiate vancomycin.  Admit to hospitalist due to associated dehydration, fatigue, and initiation of treatment.        ____________________________________________   FINAL CLINICAL IMPRESSION(S) / ED DIAGNOSES  Final diagnoses:  Colitis due to Clostridium difficile        Note:  This document was prepared using Dragon voice recognition software and may include unintentional dictation errors       Delman Kitten, MD 12/02/18 1834

## 2018-12-02 NOTE — H&P (Signed)
[facility] NAME: Corey West    MR#:  [id number]  DATE OF BIRTH:  [birth year]  DATE OF ADMISSION:  12/02/2018  PRIMARY CARE PHYSICIAN: Baxter Hire, MD   REQUESTING/REFERRING PHYSICIAN: Dr. Jacqualine Code  CHIEF COMPLAINT:   Chief Complaint  Patient presents with  . Diarrhea    HISTORY OF PRESENT ILLNESS:  Corey West  is a 68 y.o. male with a known history of CKD stage IV, hypertension, diabetes mellitus, cirrhosis, recurrent admissions for hepatic encephalopathy presently in skilled nursing facility secondary to bimalleolar fracture presents to the emergency room due to weakness, severe diarrhea and C. difficile test positive at the nursing home yesterday.  Patient was diagnosed with C. difficile on 11/08/2018 and treated with oral vancomycin at the nursing home.  Unclear how long this was treated for.  His lactulose was stopped.  Presently not on laxatives.  His diarrhea has worsened and presently up to 10-12 times a day, watery with abdominal pain, nausea.  Patient was sent to emergency room and is being admitted.  Has leukocytosis.  PAST MEDICAL HISTORY:   Past Medical History:  Diagnosis Date  . Anemia   . Anxiety    controlled;   . Arthritis   . AVM (arteriovenous malformation) of stomach, acquired with hemorrhage   . Barrett's esophagus   . Chronic kidney disease    renal infufficiency  . Cirrhosis ([place])   . Depression    controlled;   Marland Kitchen Diabetes mellitus without complication ([place])    not controlled, taking insulin but sugar continues to run high;   . Edema   . Esophageal varices ([place])   . GAVE (gastric antral vascular ectasia)   . GERD (gastroesophageal reflux disease)   . History of hiatal hernia   . Hyperlipidemia   . Hypertension    controlled well;   . Nephropathy, diabetic ([place])   . Obesity   . Pancytopenia ([place])   . Polyp, stomach    with chronic blood loss  . Sleep apnea    does not wear a cpap, Medicare would  not pay for it   . Venous stasis dermatitis of both lower extremities   . Venous stasis of both lower extremities    with cellulitis    PAST SURGICAL HISTORY:   Past Surgical History:  Procedure Laterality Date  . ESOPHAGOGASTRODUODENOSCOPY N/A 02/09/2015   Procedure: ESOPHAGOGASTRODUODENOSCOPY (EGD);  Surgeon: Manya Silvas, MD;  Location: [facility] ENDOSCOPY;  Service: Endoscopy;  Laterality: N/A;  . ESOPHAGOGASTRODUODENOSCOPY N/A 07/22/2015   Procedure: ESOPHAGOGASTRODUODENOSCOPY (EGD);  Surgeon: Manya Silvas, MD;  Location: [facility] ENDOSCOPY;  Service: Endoscopy;  Laterality: N/A;  . ESOPHAGOGASTRODUODENOSCOPY N/A 06/26/2017   Procedure: ESOPHAGOGASTRODUODENOSCOPY (EGD);  Surgeon: Manya Silvas, MD;  Location: [facility] ENDOSCOPY;  Service: Endoscopy;  Laterality: N/A;  . ESOPHAGOGASTRODUODENOSCOPY N/A 06/27/2017   Procedure: ESOPHAGOGASTRODUODENOSCOPY (EGD);  Surgeon: Manya Silvas, MD;  Location: [facility] ENDOSCOPY;  Service: Endoscopy;  Laterality: N/A;  . ESOPHAGOGASTRODUODENOSCOPY N/A 06/28/2017   Procedure: ESOPHAGOGASTRODUODENOSCOPY (EGD);  Surgeon: Manya Silvas, MD;  Location: [facility] ENDOSCOPY;  Service: Endoscopy;  Laterality: N/A;  . ESOPHAGOGASTRODUODENOSCOPY Bilateral 10/11/2017   Procedure: ESOPHAGOGASTRODUODENOSCOPY (EGD);  Surgeon: Manya Silvas, MD;  [facility] END[facility]COPY;  Service: Endoscopy;  Laterality: Bilateral;  . ESOPHAGOGASTRODUODENOSCOPY N/A 12/04/2017   Procedure: ESOPHAGOGASTRODUODENOSCOPY (EGD);  Surgeon: Manya Silvas, MD;  Location: [facility] ENDOSCOPY;  Service: Endoscopy;  Laterality: N/A;  . ESOPHAGOGASTRODUODENOSCOPY (EGD) WITH PROPOFOL N/A 07/20/2015   Procedure:  ESOPHAGOGASTRODUODENOSCOPY (EGD) WITH PROPOFOL;  Surgeon: Manya Silvas, MD;  Location: [facility] ENDOSCOPY;  Service: Endoscopy;  Laterality: N/A;  . ESOPHAGOGASTRODUODENOSCOPY (EGD) WITH PROPOFOL N/A 09/16/2015   Procedure: ESOPHAGOGASTRODUODENOSCOPY (EGD) WITH PROPOFOL;  Surgeon: Manya Silvas, MD;  Location: [facility] ENDOSCOPY;  Service: Endoscopy;  Laterality: N/A;  . ESOPHAGOGASTRODUODENOSCOPY (EGD) WITH PROPOFOL N/A 03/16/2016   Procedure: ESOPHAGOGASTRODUODENOSCOPY (EGD) WITH PROPOFOL;  Surgeon: Manya Silvas, MD;  Location: [facility] ENDOSCOPY;  Service: Endoscopy;  Laterality: N/A;  . ESOPHAGOGASTRODUODENOSCOPY (EGD) WITH PROPOFOL N/A 09/14/2016   Procedure: ESOPHAGOGASTRODUODENOSCOPY (EGD) WITH PROPOFOL;  Surgeon: Manya Silvas, MD;  Location: [facility] ENDOSCOPY;  Service: Endoscopy;  Laterality: N/A;  . ESOPHAGOGASTRODUODENOSCOPY (EGD) WITH PROPOFOL N/A 11/05/2016   Procedure: ESOPHAGOGASTRODUODENOSCOPY (EGD) WITH PROPOFOL;  Surgeon: Manya Silvas, MD;  Location: [facility] ENDOSCOPY;  Service: Endoscopy;  Laterality: N/A;  . ESOPHAGOGASTRODUODENOSCOPY (EGD) WITH PROPOFOL N/A 02/06/2017   Procedure: ESOPHAGOGASTRODUODENOSCOPY (EGD) WITH PROPOFOL;  Surgeon: Manya Silvas, MD;  Location: [facility] ENDOSCOPY;  Service: Endoscopy;  Laterality: N/A;  . ESOPHAGOGASTRODUODENOSCOPY (EGD) WITH PROPOFOL N/A 04/24/2017   Procedure: ESOPHAGOGASTRODUODENOSCOPY (EGD) WITH PROPOFOL;  Surgeon: Manya Silvas, MD;  Location: [facility] ENDOSCOPY;  Service: Endoscopy;  Laterality: N/A;  . ESOPHAGOGASTRODUODENOSCOPY (EGD) WITH PROPOFOL N/A 07/31/2017   Procedure: ESOPHAGOGASTRODUODENOSCOPY (EGD) WITH PROPOFOL;  Surgeon: Manya Silvas, MD;  Location: [facility] ENDOSCOPY;  Service: Endoscopy;  Laterality: N/A;  . ESOPHAGOGASTRODUODENOSCOPY (EGD) WITH PROPOFOL N/A 08/08/2018   Procedure: ESOPHAGOGASTRODUODENOSCOPY (EGD) WITH PROPOFOL;  Surgeon: Manya Silvas, MD;  Location: [facility] ENDOSCOPY;  Service: Endoscopy;  Laterality: N/A;  . GIVENS CAPSULE STUDY N/A 12/05/2017   Procedure: GIVENS CAPSULE STUDY;  Surgeon: Manya Silvas, MD;  Location: [facility] ENDOSCOPY;  Service: Endoscopy;  Laterality: N/A;  . TONSILLECTOMY    . TONSILLECTOMY AND ADENOIDECTOMY    . ULNAR NERVE TRANSPOSITION    .  UVULOPALATOPHARYNGOPLASTY      SOCIAL HISTORY:   Social History   Tobacco Use  . Smoking status: Current Some Day Smoker    Types: Cigars  . Smokeless tobacco: Never Used  . Tobacco comment: smoke 1 at night occasionally;   Substance Use Topics  . Alcohol use: No    Comment: stopped 12 years ago    FAMILY HISTORY:   Family History  Problem Relation Age of Onset  . Diabetes Other   . Transient ischemic attack Father   . CAD Father     DRUG ALLERGIES:  No Known Allergies  REVIEW OF SYSTEMS:   Review of Systems  Constitutional: Positive for malaise/fatigue. Negative for chills and fever.  HENT: Negative for sore throat.   Eyes: Negative for blurred vision, double vision and pain.  Respiratory: Negative for cough, hemoptysis, shortness of breath and wheezing.   Cardiovascular: Negative for chest pain, palpitations, orthopnea and leg swelling.  Gastrointestinal: Positive for abdominal pain, diarrhea and nausea. Negative for constipation, heartburn and vomiting.  Genitourinary: Negative for dysuria and hematuria.  Musculoskeletal: Positive for joint pain. Negative for back pain.  Skin: Negative for rash.  Neurological: Negative for sensory change, speech change, focal weakness and headaches.  Endo/Heme/Allergies: Does not bruise/bleed easily.  Psychiatric/Behavioral: Negative for depression. The patient is not nervous/anxious.     MEDICATIONS AT HOME:   Prior to Admission medications   Medication Sig Start Date End Date Taking? Authorizing Provider  dextromethorphan (DELSYM) 30 MG/5ML liquid Take 30 mg by mouth 2 (two) times daily.   Yes [provider]  ferrous sulfate 325 (65 FE) MG tablet  Take 325 mg by mouth 2 (two) times daily with a meal.    Yes [provider]  furosemide (LASIX) 40 MG tablet Take 1 tablet (40 mg total) by mouth daily. 10/18/18  Yes Gladstone Lighter, MD  lactulose (CHRONULAC) 10 GM/15ML solution Take 30 mLs by mouth 4 (four)  times daily.  07/04/18  Yes [provider]  LORazepam (ATIVAN) 0.5 MG tablet Take 1 tablet (0.5 mg total) by mouth every 8 (eight) hours as needed for anxiety. 10/18/18  Yes Gladstone Lighter, MD  docusate sodium (COLACE) 100 MG capsule Take 100 mg by mouth 2 (two) times daily.    [provider]  heparin 5000 UNIT/ML injection Inject 1 mL (5,000 Units total) into the skin every 8 (eight) hours for 10 days. 10/18/18 10/28/18  Gladstone Lighter, MD  HYDROcodone-acetaminophen (NORCO/VICODIN) 5-325 MG tablet Take 1-2 tablets by mouth every 6 (six) hours as needed for moderate pain. 10/18/18   Gladstone Lighter, MD  insulin aspart protamine - aspart (NOVOLOG 70/30 MIX) (70-30) 100 UNIT/ML FlexPen Inject 84 Units into the skin daily with breakfast.     [provider]  insulin aspart protamine - aspart (NOVOLOG 70/30 MIX) (70-30) 100 UNIT/ML FlexPen Inject 70 Units into the skin at bedtime.     [provider]  metolazone (ZAROXOLYN) 2.5 MG tablet Take 1 tablet (2.5 mg total) by mouth daily. 10/19/18   Gladstone Lighter, MD  Multiple Vitamin (MULTIVITAMIN) tablet Take 1 tablet by mouth daily.    [provider]  omeprazole (PRILOSEC) 40 MG capsule Take 40 mg by mouth daily.    [provider]  Potassium Chloride ER 20 MEQ TBCR Take 20 mEq by mouth 2 (two) times daily. While on lasix' 10/18/18   Gladstone Lighter, MD  ranitidine (ZANTAC) 150 MG capsule Take 150 mg by mouth 2 (two) times daily as needed for heartburn.     [provider]  rifaximin (XIFAXAN) 550 MG TABS tablet Take 1 tablet (550 mg total) by mouth 2 (two) times daily. Patient not taking: Reported on 10/16/2018 01/01/18   Loletha Grayer, MD     VITAL SIGNS:  Blood pressure (!) 109/46, pulse 72, temperature 98.6 F (37 C), temperature source Oral, resp. rate (!) 25, height 5\' 10"  (1.778 m), weight 124.3 kg, SpO2 96 %.  PHYSICAL EXAMINATION:  Physical Exam  GENERAL:  68  y.o.-year-old patient lying in the bed with no acute distress.  EYES: Pupils equal, round, reactive to light and accommodation. No scleral icterus. Extraocular muscles intact.  HEENT: Head atraumatic, normocephalic. Oropharynx and nasopharynx clear. No oropharyngeal erythema, moist oral mucosa  NECK:  Supple, no jugular venous distention. No thyroid enlargement, no tenderness.  LUNGS: Normal breath sounds bilaterally, no wheezing, rales, rhonchi. No use of accessory muscles of respiration.  CARDIOVASCULAR: S1, S2 normal. No murmurs, rubs, or gallops.  ABDOMEN: Soft, nontender, nondistended. Bowel sounds present. No organomegaly or mass.  EXTREMITIES: No pedal edema, cyanosis, or clubbing. + 2 pedal & radial pulses b/l.   NEUROLOGIC: Cranial nerves II through XII are intact. No focal Motor or sensory deficits appreciated b/l PSYCHIATRIC: The patient is alert and oriented x 3. Good affect.  SKIN: No obvious rash, lesion, or ulcer.   LABORATORY PANEL:   CBC Recent Labs  Lab 12/02/18 1508  WBC 13.8*  HGB 10.4*  HCT 30.4*  PLT 134*   ------------------------------------------------------------------------------------------------------------------  Chemistries  Recent Labs  Lab 12/02/18 1508  NA 130*  K 2.9*  CL 96*  CO2 25  GLUCOSE 123*  BUN 38*  CREATININE 2.42*  CALCIUM 7.9*  AST 27  ALT 18  ALKPHOS 76  BILITOT 0.6   ------------------------------------------------------------------------------------------------------------------  Cardiac Enzymes No results for input(s): TROPONINI in the last 168 hours. ------------------------------------------------------------------------------------------------------------------  RADIOLOGY:  No results found.   IMPRESSION AND PLAN:   *C. difficile diarrhea.  Recurrent after being treated for vancomycin.  Unclear how long this was treated on first diagnosis.  At this time will start on oral vancomycin.  Will need to treat for at  least 14 days.  Added probiotics.  Avoid laxatives or lactulose.  Ammonia level is normal.  Isolation ordered.  *CKD stage IV is stable.  Monitor for dehydration and worsening.  *Hypokalemia secondary to severe diarrhea.  Replace orally.  Telemetry monitoring for 24-hour ordered.  *Diabetes mellitus.  Sliding scale insulin ordered.  Diabetic diet.  *Cirrhosis.  Normal ammonia.  No lactulose.  * DVT prophylaxis with renally dosed Lovenox  All the records are reviewed and case discussed with ED provider. Management plans discussed with the patient, family and they are in agreement.  CODE STATUS: FULL CODE  TOTAL TIME TAKING CARE OF THIS PATIENT: 35 minutes.   Leia Alf Kali Ambler M.D on 12/02/2018 at 5:21 PM  Between 7am to 6pm - Pager - [phone]  After 6pm go to [email or web address] - password [email or web address] [facility]  Office  [phone]  CC: Primary care physician; Baxter Hire, MD  Note: This dictation was prepared with Dragon dictation along with smaller phrase technology. Any transcriptional errors that result from this process are unintentional.

## 2018-12-02 NOTE — ED Triage Notes (Signed)
Pt to ED via EMS from [facility] c/o diarrhea for several days, too many to count, per EMS has stopped taking medication for ammonia because of diarrhea but is now hallucinating.  Per patient seems like intermittent forgetfulness, denies pain.  Hx DBM as well with EMS CBG 210, 81 HR, 114/54 BP.  Presents A&Ox4, in NAD at this time.  Is at [facility] for bilateral leg fractures.

## 2018-12-02 NOTE — Progress Notes (Signed)
Advance care planning  Purpose of Encounter C diff diarrhea  Parties in Attendance Patient  Patients Decisional capacity Patient is alert and oriented.  Able to make medical decisions. Corey West is his HCPAO  Discussed with patient regarding C. difficile, need for admission, prognosis and treatment plan.  All questions answered  CODE STATUS discussed and patient mentions he would like to continue aggressive care along with resuscitation and intubation if needed.  Would not want to be on long-term artificial life support.  He went through this with his parents and has made that decision.  Orders entered.  CODE STATUS change.  Full code.  Time spent - 17 minutes

## 2018-12-02 NOTE — ED Notes (Signed)
ED TO INPATIENT HANDOFF REPORT  ED Nurse Name and Phone #: Maryelizabeth Rowan  S Name/Age/Gender Corey West 68 y.o. male Room/Bed: ED15A/ED15A  Code Status   Code Status: Full Code  Home/SNF/Other Rehab Patient oriented to: self Is this baseline? Yes   Triage Complete: Triage complete  Chief Complaint leg pain  Triage Note Pt to ED via EMS from [facility] c/o diarrhea for several days, too many to count, per EMS has stopped taking medication for ammonia because of diarrhea but is now hallucinating.  Per patient seems like intermittent forgetfulness, denies pain.  Hx DBM as well with EMS CBG 210, 81 HR, 114/54 BP.  Presents A&Ox4, in NAD at this time.  Is at [facility] for bilateral leg fractures.   Allergies No Known Allergies  Level of Care/Admitting Diagnosis ED Disposition    ED Disposition Condition [facility] Area: [place] [[id number]]  Level of Care: Med-Surg [16]  Diagnosis: C. difficile diarrhea [902409]  Admitting Physician: Hillary Bow [[id number]]  Attending Physician: Hillary Bow [[id number]]  Estimated length of stay: past midnight tomorrow  Certification:: I certify this patient will need inpatient services for at least 2 midnights  PT Class (Do Not Modify): Inpatient [101]  PT Acc Code (Do Not Modify): Private [1]       B Medical/Surgery History Past Medical History:  Diagnosis Date  . Anemia   . Anxiety    controlled;   . Arthritis   . AVM (arteriovenous malformation) of stomach, acquired with hemorrhage   . Barrett's esophagus   . Chronic kidney disease    renal infufficiency  . Cirrhosis ([place])   . Depression    controlled;   Marland Kitchen Diabetes mellitus without complication ([place])    not controlled, taking insulin but sugar continues to run high;   . Edema   . Esophageal varices ([place])   . GAVE (gastric antral vascular ectasia)   . GERD (gastroesophageal reflux disease)   . History of hiatal hernia   . Hyperlipidemia    . Hypertension    controlled well;   . 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 Obesity   . Pancytopenia ([place])   . Polyp, stomach    with chronic blood loss  . Sleep apnea    does not wear a cpap, Medicare would not pay for it   . Venous stasis dermatitis of both lower extremities   . Venous stasis of both lower extremities    with cellulitis   Past Surgical History:  Procedure Laterality Date  . ESOPHAGOGASTRODUODENOSCOPY N/A 02/09/2015   Procedure: ESOPHAGOGASTRODUODENOSCOPY (EGD);  Surgeon: Manya Silvas, MD;  Location: [facility] ENDOSCOPY;  Service: Endoscopy;  Laterality: N/A;  . ESOPHAGOGASTRODUODENOSCOPY N/A 07/22/2015   Procedure: ESOPHAGOGASTRODUODENOSCOPY (EGD);  Surgeon: Manya Silvas, MD;  Location: [facility] ENDOSCOPY;  Service: Endoscopy;  Laterality: N/A;  . ESOPHAGOGASTRODUODENOSCOPY N/A 06/26/2017   Procedure: ESOPHAGOGASTRODUODENOSCOPY (EGD);  Surgeon: Manya Silvas, MD;  Location[facility] ENDOSCOPY;  Service: Endoscopy;  Laterality: N/A;  . ESOPHAGOGASTRODUODENOSCOPY N/A 06/27/2017   Procedure: ESOPHAGOGASTRODUODENOSCOPY (EGD);  Surgeon: Manya Silvas, MD;  Location: [facility] ENDOSCOPY;  Service: Endoscopy;  Laterality: N/A;  . ESOPHAGOGASTRODUODENOSCOPY N/A 06/28/2017   Procedure: ESOPHAGOGASTRODUODENOSCOPY (EGD);  Surgeon: Manya Silvas, MD;  Location: [facility] ENDOSCOPY;  Service: Endoscopy;  Laterality: N/A;  . ESOPHAGOGASTRODUODENOSCOPY Bilateral 10/11/2017   Procedure: ESOPHAGOGASTRODUODENOSCOPY (EGD);  Surgeon: Manya Silvas, MD;  Location: [facility] ENDOSCOPY;  Service: Endoscopy;  Laterality: Bilateral;  . ESOPHAGOGASTRODUODENOSCOPY N/A 12/04/2017  Procedure: ESOPHAGOGASTRODUODENOSCOPY (EGD);  Surgeon: Manya Silvas, MD;  Location: [facility] ENDOSCOPY;  Service: Endoscopy;  Laterality: N/A;  . ESOPHAGOGASTRODUODENOSCOPY (EGD) WITH PROPOFOL N/A 07/20/2015   Procedure: ESOPHAGOGASTRODUODENOSCOPY (EGD) WITH PROPOFOL;  Surgeon: Manya Silvas, MD;  Location: [facility] ENDOSCOPY;   Service: Endoscopy;  Laterality: N/A;  . ESOPHAGOGASTRODUODENOSCOPY (EGD) WITH PROPOFOL N/A 09/16/2015   Procedure: ESOPHAGOGASTRODUODENOSCOPY (EGD) WITH PROPOFOL;  Surgeon: Manya Silvas, MD;  Location: [facility] ENDOSCOPY;  Service: Endoscopy;  Laterality: N/A;  . ESOPHAGOGASTRODUODENOSCOPY (EGD) WITH PROPOFOL N/A 03/16/2016   Procedure: ESOPHAGOGASTRODUODENOSCOPY (EGD) WITH PROPOFOL;  Surgeon: Manya Silvas, MD;  Location: [facility] ENDOSCOPY;  Service: Endoscopy;  Laterality: N/A;  . ESOPHAGOGASTRODUODENOSCOPY (EGD) WITH PROPOFOL N/A 09/14/2016   Procedure: ESOPHAGOGASTRODUODENOSCOPY (EGD) WITH PROPOFOL;  Surgeon: Manya Silvas, MD;  Location: [facility] ENDOSCOPY;  Service: Endoscopy;  Laterality: N/A;  . ESOPHAGOGASTRODUODENOSCOPY (EGD) WITH PROPOFOL N/A 11/05/2016   Procedure: ESOPHAGOGASTRODUODENOSCOPY (EGD) WITH PROPOFOL;  Surgeon: Manya Silvas, MD;  Location: [facility] ENDOSCOPY;  Service: Endoscopy;  Laterality: N/A;  . ESOPHAGOGASTRODUODENOSCOPY (EGD) WITH PROPOFOL N/A 02/06/2017   Procedure: ESOPHAGOGASTRODUODENOSCOPY (EGD) WITH PROPOFOL;  Surgeon: Manya Silvas, MD;  Location: [facility] ENDOSCOPY;  Service: Endoscopy;  Laterality: N/A;  . ESOPHAGOGASTRODUODENOSCOPY (EGD) WITH PROPOFOL N/A 04/24/2017   Procedure: ESOPHAGOGASTRODUODENOSCOPY (EGD) WITH PROPOFOL;  Surgeon: Manya Silvas, MD;  Location: [facility] ENDOSCOPY;  Service: Endoscopy;  Laterality: N/A;  . ESOPHAGOGASTRODUODENOSCOPY (EGD) WITH PROPOFOL N/A 07/31/2017   Procedure: ESOPHAGOGASTRODUODENOSCOPY (EGD) WITH PROPOFOL;  Surgeon: Manya Silvas, MD;  Location: [facility] ENDOSCOPY;  Service: Endoscopy;  Laterality: N/A;  . ESOPHAGOGASTRODUODENOSCOPY (EGD) WITH PROPOFOL N/A 08/08/2018   Procedure: ESOPHAGOGASTRODUODENOSCOPY (EGD) WITH PROPOFOL;  Surgeon: Manya Silvas, MD;  Location: [facility] ENDOSCOPY;  Service: Endoscopy;  Laterality: N/A;  . GIVENS CAPSULE STUDY N/A 12/05/2017   Procedure: GIVENS CAPSULE STUDY;  Surgeon: Manya Silvas, MD;   Location: [facility] ENDOSCOPY;  Service: Endoscopy;  Laterality: N/A;  . TONSILLECTOMY    . TONSILLECTOMY AND ADENOIDECTOMY    . ULNAR NERVE TRANSPOSITION    . UVULOPALATOPHARYNGOPLASTY       A IV Location/Drains/Wounds Patient Lines/Drains/Airways Status   Active Line/Drains/Airways    Name:   Placement date:   Placement time:   Site:   Days:   Peripheral IV 12/02/18 Right Forearm   12/02/18    1509    Forearm   less than 1   Pressure Injury 07/19/18 Stage II -  Partial thickness loss of dermis presenting as a shallow open ulcer with a red, pink wound bed without slough. 3 small stage 3 to buttocks   07/19/18    0000     136   Wound / Incision (Open or Dehisced) 10/16/18 Other (Comment);Non-pressure wound Arm Left Above left elbow 1cm x1/4cm skintear. No drainage.   10/16/18    0100    Arm   47          Intake/Output Last 24 hours No intake or output data in the 24 hours ending 12/02/18 1755  Labs/Imaging Results for orders placed or performed during the hospital encounter of 12/02/18 (from the past 48 hour(s))  Lipase, blood     Status: None   Collection Time: 12/02/18  3:08 PM  Result Value Ref Range   Lipase 24 11 - 51 U/L    Comment: Performed at [facility], [address] [place], [place] [address]  Comprehensive metabolic panel     Status: Abnormal   Collection Time: 12/02/18  3:08 PM  Result  Value Ref Range   Sodium 130 (L) 135 - 145 mmol/L   Potassium 2.9 (L) 3.5 - 5.1 mmol/L   Chloride 96 (L) 98 - 111 mmol/L   CO2 25 22 - 32 mmol/L   Glucose, Bld 123 (H) 70 - 99 mg/dL   BUN 38 (H) 8 - 23 mg/dL   Creatinine, Ser 2.42 (H) 0.61 - 1.24 mg/dL   Calcium 7.9 (L) 8.9 - 10.3 mg/dL   Total Protein 5.7 (L) 6.5 - 8.1 g/dL   Albumin 2.5 (L) 3.5 - 5.0 g/dL   AST 27 15 - 41 U/L   ALT 18 0 - 44 U/L   Alkaline Phosphatase 76 38 - 126 U/L   Total Bilirubin 0.6 0.3 - 1.2 mg/dL   GFR calc non Af Amer 27 (L) >60 mL/min   GFR calc Af Amer 31 (L) >60 mL/min   Anion gap 9  5 - 15    Comment: Performed at [facility], [address] [place], [place] [address]  CBC     Status: Abnormal   Collection Time: 12/02/18  3:08 PM  Result Value Ref Range   WBC 13.8 (H) 4.0 - 10.5 K/uL   RBC 3.54 (L) 4.22 - 5.81 MIL/uL   Hemoglobin 10.4 (L) 13.0 - 17.0 g/dL   HCT 30.4 (L) 39.0 - 52.0 %   MCV 85.9 80.0 - 100.0 fL   MCH 29.4 26.0 - 34.0 pg   MCHC 34.2 30.0 - 36.0 g/dL   RDW 14.2 11.5 - 15.5 %   Platelets 134 (L) 150 - 400 K/uL   nRBC 0.0 0.0 - 0.2 %    Comment: Performed at [facility], [address] [place], [place] [address]  Urinalysis, Complete w Microscopic     Status: Abnormal   Collection Time: 12/02/18  3:08 PM  Result Value Ref Range   Color, Urine YELLOW (A) YELLOW   APPearance CLEAR (A) CLEAR   Specific Gravity, Urine 1.006 1.005 - 1.030   pH 5.0 5.0 - 8.0   Glucose, UA NEGATIVE NEGATIVE mg/dL   Hgb urine dipstick NEGATIVE NEGATIVE   Bilirubin Urine NEGATIVE NEGATIVE   Ketones, ur NEGATIVE NEGATIVE mg/dL   Protein, ur NEGATIVE NEGATIVE mg/dL   Nitrite NEGATIVE NEGATIVE   Leukocytes,Ua NEGATIVE NEGATIVE   RBC / HPF 0-5 0 - 5 RBC/hpf   WBC, UA 0-5 0 - 5 WBC/hpf   Bacteria, UA NONE SEEN NONE SEEN   Squamous Epithelial / LPF 0-5 0 - 5   Mucus PRESENT    Hyaline Casts, UA PRESENT     Comment: Performed at [facility], [address] [place], [place] [address]  Ammonia     Status: None   Collection Time: 12/02/18  3:08 PM  Result Value Ref Range   Ammonia 34 9 - 35 umol/L    Comment: Performed at [facility], [address] [place], [place] [address]  Magnesium     Status: Abnormal   Collection Time: 12/02/18  3:08 PM  Result Value Ref Range   Magnesium 1.6 (L) 1.7 - 2.4 mg/dL    Comment: Performed at [facility], [address] [place], [place] [address]   No results found.  Pending Labs Unresulted Labs (From admission, onward)    Start     Ordered   12/09/18 0500  Creatinine,  serum  (enoxaparin (LOVENOX)    CrCl < 30 ml/min)  Weekly,   STAT    Comments:  while on enoxaparin therapy.    12/02/18 1713   12/03/18 4008  Basic metabolic panel  Tomorrow morning,   STAT     12/02/18 1713   12/03/18 0500  CBC  Tomorrow morning,   STAT     12/02/18 1713   12/02/18 1711  Hemoglobin A1c  Add-on,   AD    Comments:  To assess prior glycemic control    12/02/18 1713          Vitals/Pain Today's Vitals   12/02/18 1505 12/02/18 1506 12/02/18 1700  BP: (!) 127/52  (!) 109/46  Pulse: 78  72  Resp: 17  (!) 25  Temp: 98.6 F (37 C)    TempSrc: Oral    SpO2: 96%  96%  Weight:  124.3 kg   Height:  5\' 10"  (1.778 m)   PainSc:  0-No pain     Isolation Precautions Enteric precautions (UV disinfection)  Medications Medications  vancomycin (VANCOCIN) 50 mg/mL oral solution 125 mg (125 mg Oral Given 12/02/18 1747)  potassium chloride [place] (K-DUR,KLOR-CON) CR tablet 40 mEq (40 mEq Oral Given 12/02/18 1748)  0.9 % NaCl with KCl 40 mEq / L  infusion (0 mL/hr Intravenous Transfusing/Transfer 12/02/18 1755)  insulin aspart (novoLOG) injection 0-9 Units (has no administration in time range)  insulin aspart (novoLOG) injection 0-5 Units (has no administration in time range)  enoxaparin (LOVENOX) injection 40 mg (40 mg Subcutaneous Given 12/02/18 1749)  sodium chloride flush (NS) 0.9 % injection 3 mL (has no administration in time range)  acetaminophen (TYLENOL) tablet 650 mg (has no administration in time range)    Or  acetaminophen (TYLENOL) suppository 650 mg (has no administration in time range)  ondansetron (ZOFRAN) tablet 4 mg (has no administration in time range)    Or  ondansetron (ZOFRAN) injection 4 mg (has no administration in time range)  albuterol (PROVENTIL) (2.5 MG/3ML) 0.083% nebulizer solution 2.5 mg (has no administration in time range)  saccharomyces boulardii (FLORASTOR) capsule 250 mg (has no administration in time range)  sodium chloride 0.9 % bolus 500 mL (0  mLs Intravenous Stopped 12/02/18 1755)    Mobility walks with device Moderate fall risk     R Recommendations: See Admitting Provider Note  Report given to:

## 2018-12-03 DIAGNOSIS — E440 Moderate protein-calorie malnutrition: Secondary | ICD-10-CM

## 2018-12-03 LAB — BASIC METABOLIC PANEL
Anion gap: 8 (ref 5–15)
BUN: 38 mg/dL — ABNORMAL HIGH (ref 8–23)
CO2: 24 mmol/L (ref 22–32)
CREATININE: 2.18 mg/dL — AB (ref 0.61–1.24)
Calcium: 7.9 mg/dL — ABNORMAL LOW (ref 8.9–10.3)
Chloride: 100 mmol/L (ref 98–111)
GFR calc Af Amer: 35 mL/min — ABNORMAL LOW (ref >60)
GFR calc non Af Amer: 30 mL/min — ABNORMAL LOW (ref >60)
Glucose, Bld: 130 mg/dL — ABNORMAL HIGH (ref 70–99)
Potassium: 3.7 mmol/L (ref 3.5–5.1)
Sodium: 132 mmol/L — ABNORMAL LOW (ref 135–145)

## 2018-12-03 LAB — MRSA PCR SCREENING: MRSA by PCR: NEGATIVE

## 2018-12-03 LAB — CBC
HCT: 29.7 % — ABNORMAL LOW (ref 39.0–52.0)
Hemoglobin: 10.1 g/dL — ABNORMAL LOW (ref 13.0–17.0)
MCH: 30 pg (ref 26.0–34.0)
MCHC: 34 g/dL (ref 30.0–36.0)
MCV: 88.1 fL (ref 80.0–100.0)
Platelets: 134 10*3/uL — ABNORMAL LOW (ref 150–400)
RBC: 3.37 MIL/uL — ABNORMAL LOW (ref 4.22–5.81)
RDW: 14.5 % (ref 11.5–15.5)
WBC: 13.7 10*3/uL — ABNORMAL HIGH (ref 4.0–10.5)
nRBC: 0 % (ref 0.0–0.2)

## 2018-12-03 LAB — GLUCOSE, CAPILLARY
Glucose-Capillary: 165 mg/dL — ABNORMAL HIGH (ref 70–99)
Glucose-Capillary: 272 mg/dL — ABNORMAL HIGH (ref 70–99)

## 2018-12-03 MED ORDER — ENSURE MAX PROTEIN PO LIQD
11.0000 [oz_av] | Freq: Two times a day (BID) | ORAL | Status: DC
  Administered 2018-12-04 – 2018-12-05 (×2): 11 [oz_av] via ORAL

## 2018-12-03 NOTE — Progress Notes (Signed)
Initial Nutrition Assessment  DOCUMENTATION CODES:   Non-severe (moderate) malnutrition in context of chronic illness, Obesity unspecified  INTERVENTION:   - Ensure Max po BID, each supplement provides 150 kcal and 30 grams of protein  - Magic cup TID with meals, each supplement provides 290 kcal and 9 grams of protein  NUTRITION DIAGNOSIS:   Moderate Malnutrition related to chronic illness (cirrhosis, CKD stage IV) as evidenced by mild fat depletion, moderate fat depletion, mild muscle depletion, percent weight loss (7% weight loss in 2 months).  GOAL:   Patient will meet greater than or equal to 90% of their needs  MONITOR:   PO intake, Supplement acceptance, I & O's, Labs, Weight trends  REASON FOR ASSESSMENT:   Malnutrition Screening Tool    ASSESSMENT:   68 year old male who presented to the ED on 3/3 from [facility] with diarrhea for several days. PMH of anemia, Barrett's esophagus, CKD stage IV, cirrhosis, T2DM, esophageal varices, GERD, HLD, HTN. Pt is C. difficile positive.  Spoke with pt at bedside. RN in room providing nursing care at time of visit. Pt shares that he has had a very poor appetite over the last 1 week but has still been eating things like crackers. Pt shares that he has had a difficult 10 weeks in terms of his health and that he suspects some of the appetite loss is related to health issues.  Noted untouched breakfast meal tray at bedside. RD offered to set up meal tray so that pt could eat breakfast but pt stated that "I'm not feeling up for that." Pt did eat a pack of graham crackers and drink a carton of milk. Pt requesting ginger ale; RD provided this to pt with RN approval.  Pt reports that he typically eats 3 meals daily when he is feeling well. Pt states that because he still works part-time, he gets a sausage, egg, and cheese biscuit with Diet Coke from [employer] most mornings for breakfast. Pt states that lunch may include "something more  healthy" like fish or chicken. Dinner may be light and include a sandwich.  Pt endorses significant weight loss over time. Pt states that at one point in his life, he weighed 400 lbs. Per weight history in chart, pt's weight has been steadily decreasing over the last 1 year. Pt with a 9.2 kg weight loss in less than 2 months. This is a 7.0% weight loss which is significant for timeframe.  Pt denies any difficulty chewing or swallowing. Pt does state that recently he has not been as mobile due to bilateral ankle fractures. Pt reports that he has been going to therapies at [facility].  Medications reviewed and include: ferrous sulfate, SSI, Novolog 70/30 50 units daily at bedtime, Novolog 70/30 70 units daily with breakfast, Protonix, K-dur 20 mEq BID, Florastor, zinc sulfate 220 mg daily  Labs reviewed: sodium 132 (L), BUN 38 (H), creatinine 2.18 (H), hemoglobin A1C 6.8 (H) CBG's: 165, 99  NUTRITION - FOCUSED PHYSICAL EXAM:    Most Recent Value  Orbital Region  Mild depletion  Upper Arm Region  Moderate depletion  Thoracic and Lumbar Region  No depletion  Buccal Region  Mild depletion  Temple Region  No depletion  Clavicle Bone Region  No depletion  Clavicle and Acromion Bone Region  Mild depletion  Scapular Bone Region  Unable to assess  Dorsal Hand  Mild depletion  Patellar Region  Unable to assess [edema to BLE]  Anterior Thigh Region  Unable to assess  Posterior Calf Region  Unable to assess  Edema (RD Assessment)  Moderate [BLE]  Hair  Reviewed  Eyes  Reviewed  Mouth  Reviewed  Skin  Reviewed  Nails  Reviewed       Diet Order:   Diet Order            Diet Carb Modified Fluid consistency: Thin; Room service appropriate? Yes  Diet effective now              EDUCATION NEEDS:   No education needs have been identified at this time  Skin:  Skin Assessment: Reviewed RN Assessment  Last BM:  12/03/18 large type 7 (rectal tube)  Height:   Ht Readings from Last 1  Encounters:  12/02/18 5\' 10"  (1.778 m)    Weight:   Wt Readings from Last 1 Encounters:  12/03/18 121.4 kg    Ideal Body Weight:  75.5 kg  BMI:  Body mass index is 38.4 kg/m.  Estimated Nutritional Needs:   Kcal:  2100-2300  Protein:  110-125 grams  Fluid:  >/= 2.1 L    Gaynell Face, MS, RD, LDN Inpatient Clinical Dietitian Pager: [phone] Weekend/After Hours: [phone]

## 2018-12-03 NOTE — Progress Notes (Signed)
[place] [facility] [place] NAME: Corey West    MR#:  [id number]  DATE OF BIRTH:  1951/06/29  SUBJECTIVE:  CHIEF COMPLAINT:   Chief Complaint  Patient presents with  . Diarrhea   History of C. difficile colitis.  Came with recurrence of diarrhea and noted to have positive for C. difficile.  Denies any new complaints. REVIEW OF SYSTEMS:  CONSTITUTIONAL: No fever, have fatigue or weakness.  EYES: No blurred or double vision.  EARS, NOSE, AND THROAT: No tinnitus or ear pain.  RESPIRATORY: No cough, shortness of breath, wheezing or hemoptysis.  CARDIOVASCULAR: No chest pain, orthopnea, edema.  GASTROINTESTINAL: No nausea, vomiting, and no diarrhea or abdominal pain.  GENITOURINARY: No dysuria, hematuria.  ENDOCRINE: No polyuria, nocturia,  HEMATOLOGY: No anemia, easy bruising or bleeding SKIN: No rash or lesion. MUSCULOSKELETAL: No joint pain or arthritis.   NEUROLOGIC: No tingling, numbness, weakness.  PSYCHIATRY: No anxiety or depression.   ROS  DRUG ALLERGIES:  No Known Allergies  VITALS:  Blood pressure 129/60, pulse 66, temperature 97.7 F (36.5 C), temperature source Oral, resp. rate 20, height 5\' 10"  (1.778 m), weight 121.4 kg, SpO2 100 %.  PHYSICAL EXAMINATION:  GENERAL:  68 y.o.-year-old patient lying in the bed with no acute distress.  EYES: Pupils equal, round, reactive to light and accommodation. No scleral icterus. Extraocular muscles intact.  HEENT: Head atraumatic, normocephalic. Oropharynx and nasopharynx clear.  NECK:  Supple, no jugular venous distention. No thyroid enlargement, no tenderness.  LUNGS: Normal breath sounds bilaterally, no wheezing, rales,rhonchi or crepitation. No use of accessory muscles of respiration.  CARDIOVASCULAR: S1, S2 normal. No murmurs, rubs, or gallops.  ABDOMEN: Soft, nontender, nondistended. Bowel sounds present. No organomegaly or mass.  EXTREMITIES: No pedal edema, cyanosis, or clubbing.  Her  bandage on left foot, he confirms that it was applied to fit his Ortho boot properly so that he does not scratch his skin. NEUROLOGIC: Cranial nerves II through XII are intact. Muscle strength 4/5 in all extremities. Sensation intact. Gait not checked.  PSYCHIATRIC: The patient is alert and oriented x 3.  SKIN: No obvious rash, lesion, or ulcer.   Physical Exam LABORATORY PANEL:   CBC Recent Labs  Lab 12/03/18 0518  WBC 13.7*  HGB 10.1*  HCT 29.7*  PLT 134*   ------------------------------------------------------------------------------------------------------------------  Chemistries  Recent Labs  Lab 12/02/18 1508 12/03/18 0518  NA 130* 132*  K 2.9* 3.7  CL 96* 100  CO2 25 24  GLUCOSE 123* 130*  BUN 38* 38*  CREATININE 2.42* 2.18*  CALCIUM 7.9* 7.9*  MG 1.6*  --   AST 27  --   ALT 18  --   ALKPHOS 76  --   BILITOT 0.6  --    ------------------------------------------------------------------------------------------------------------------  Cardiac Enzymes No results for input(s): TROPONINI in the last 168 hours. ------------------------------------------------------------------------------------------------------------------  RADIOLOGY:  Dg Abd 2 Views  Result Date: 12/02/2018 CLINICAL DATA:  Diarrhea for several days. Positive C difficile. Evaluation for megacolon. EXAM: ABDOMEN - 2 VIEW COMPARISON:  CT AP 02/21/2017 FINDINGS: Nonobstructed, nondistended bowel gas pattern. No free air. No organomegaly is noted. Two rounded calcifications seen in the right upper quadrant may reflect the patient's known gallstones. Gentle levoconvex curvature of the lumbar spine with degenerative disc disease and endplate spurring. IMPRESSION: Nonspecific nonobstructive bowel gas pattern. No significant small nor large bowel dilatation is seen. Cholelithiasis. Electronically Signed   By: Ashley Royalty M.D.   On: 12/02/2018 18:16  ASSESSMENT AND PLAN:   Active Problems:   C.  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     At this time will start on oral vancomycin.  Will need to treat for at least 14 days.  Added probiotics.  Avoid laxatives or lactulose.  Ammonia level is normal.  Isolation ordered.  *CKD stage IV is stable.  Monitor for dehydration and worsening.  *Hypokalemia secondary to severe diarrhea.  Replace orally.  Telemetry monitoring for 24-hour ordered.  *Diabetes mellitus.  Sliding scale insulin ordered.  Diabetic diet.  *Cirrhosis.  Normal ammonia.  No lactulose.  * DVT prophylaxis with renally dosed Lovenox  *Recent fracture and surgery on his ankles and foot Spoke to Ortho to guide about weightbearing status and need for orthopedic shoes.   All the records are reviewed and case discussed with Care Management/Social Workerr. Management plans discussed with the patient, family and they are in agreement.  CODE STATUS: Full  TOTAL TIME TAKING CARE OF THIS PATIENT: 35 minutes.     POSSIBLE D/C IN 1-2 DAYS, DEPENDING ON CLINICAL CONDITION.   [name] M.D on 12/03/2018   Between 7am to 6pm - Pager - [phone]  After 6pm go to [email or web address] - password [email or web address] [facility]  Office  [phone]  CC: Primary care physician; Baxter Hire, MD  Note: This dictation was prepared with Dragon dictation along with smaller phrase technology. Any transcriptional errors that result from this process are unintentional.

## 2018-12-04 LAB — GLUCOSE, CAPILLARY
Glucose-Capillary: 234 mg/dL — ABNORMAL HIGH (ref 70–99)
Glucose-Capillary: 249 mg/dL — ABNORMAL HIGH (ref 70–99)
Glucose-Capillary: 119 mg/dL — ABNORMAL HIGH (ref 70–99)

## 2018-12-04 MED ORDER — INSULIN ASPART PROT & ASPART (70-30 MIX) 100 UNIT/ML ~~LOC~~ SUSP
50.0000 [IU] | Freq: Every day | SUBCUTANEOUS | Status: DC
  Administered 2018-12-04 – 2018-12-05 (×2): 50 [IU] via SUBCUTANEOUS
  Filled 2018-12-04: qty 10

## 2018-12-04 NOTE — Care Management Note (Signed)
Case Management Note  Patient Details  Name: Corey West MRN: [id number] Date of Birth: 12-16-1950   Patient admitted from [facility] with cdiff.  Patient states at baseline he lives at home alone. Has a niece and nephew that live locally.  Nephew available to provide transportation when needed.  PT has assessed patient and recommends SNF. Patient is undecided if he wants to discharge back to SNF, or home.  CMS Medicare.gov Compare Post Acute Care list reviewed with Patient.  Patient states that if he returns home he would like to use [facility].  Heads up referral made to [facility] with [place].  Patient states if he goes to a facility he would like to go to [facility] in a private room. Patient states that he has a RW and cane in the home.  RNCM following for discharge planning.   Subjective/Objective:                    Action/Plan:   Expected Discharge Date:                  Expected Discharge Plan:     In-House Referral:     Discharge planning Services     Post Acute Care Choice:    Choice offered to:     DME Arranged:    DME Agency:     HH Arranged:    HH Agency:     Status of Service:  In process, will continue to follow  If discussed at Long Length of Stay Meetings, dates discussed:    Additional Comments:  Beverly Sessions, RN 12/04/2018, 3:35 PM

## 2018-12-04 NOTE — Progress Notes (Signed)
[facility] NAME: Corey West    MR#:  [id number]  DATE OF BIRTH:  [birth year]  SUBJECTIVE:  CHIEF COMPLAINT:   Chief Complaint  Patient presents with  . Diarrhea   History of C. difficile colitis.  Came with recurrence of diarrhea and noted to have positive for C. difficile.  Denies any new complaints. Still have liquidy stool and rectal tube for drainage.  REVIEW OF SYSTEMS:  CONSTITUTIONAL: No fever, have fatigue or weakness.  EYES: No blurred or double vision.  EARS, NOSE, AND THROAT: No tinnitus or ear pain.  RESPIRATORY: No cough, shortness of breath, wheezing or hemoptysis.  CARDIOVASCULAR: No chest pain, orthopnea, edema.  GASTROINTESTINAL: No nausea, vomiting, and no diarrhea or abdominal pain.  GENITOURINARY: No dysuria, hematuria.  ENDOCRINE: No polyuria, nocturia,  HEMATOLOGY: No anemia, easy bruising or bleeding SKIN: No rash or lesion. MUSCULOSKELETAL: No joint pain or arthritis.   NEUROLOGIC: No tingling, numbness, weakness.  PSYCHIATRY: No anxiety or depression.   ROS  DRUG ALLERGIES:  No Known Allergies  VITALS:  Blood pressure (!) 143/60, pulse 72, temperature 97.6 F (36.4 C), temperature source Oral, resp. rate 20, height 5\' 10"  (1.778 m), weight 122.5 kg, SpO2 100 %.  PHYSICAL EXAMINATION:  GENERAL:  68 y.o.-year-old patient lying in the bed with no acute distress.  EYES: Pupils equal, round, reactive to light and accommodation. No scleral icterus. Extraocular muscles intact.  HEENT: Head atraumatic, normocephalic. Oropharynx and nasopharynx clear.  NECK:  Supple, no jugular venous distention. No thyroid enlargement, no tenderness.  LUNGS: Normal breath sounds bilaterally, no wheezing, rales,rhonchi or crepitation. No use of accessory muscles of respiration.  CARDIOVASCULAR: S1, S2 normal. No murmurs, rubs, or gallops.  ABDOMEN: Soft, nontender, nondistended. Bowel sounds present. No organomegaly or  mass.  Rectal tube with liquidy stool present. EXTREMITIES: No pedal edema, cyanosis, or clubbing.  Her bandage on left foot, he confirms that it was applied to fit his Ortho boot properly so that he does not scratch his skin. NEUROLOGIC: Cranial nerves II through XII are intact. Muscle strength 4/5 in all extremities. Sensation intact. Gait not checked.  PSYCHIATRIC: The patient is alert and oriented x 3.  SKIN: No obvious rash, lesion, or ulcer.   Physical Exam LABORATORY PANEL:   CBC Recent Labs  Lab 12/03/18 0518  WBC 13.7*  HGB 10.1*  HCT 29.7*  PLT 134*   ------------------------------------------------------------------------------------------------------------------  Chemistries  Recent Labs  Lab 12/02/18 1508 12/03/18 0518  NA 130* 132*  K 2.9* 3.7  CL 96* 100  CO2 25 24  GLUCOSE 123* 130*  BUN 38* 38*  CREATININE 2.42* 2.18*  CALCIUM 7.9* 7.9*  MG 1.6*  --   AST 27  --   ALT 18  --   ALKPHOS 76  --   BILITOT 0.6  --    ------------------------------------------------------------------------------------------------------------------  Cardiac Enzymes No results for input(s): TROPONINI in the last 168 hours. ------------------------------------------------------------------------------------------------------------------  RADIOLOGY:  Dg Abd 2 Views  Result Date: 12/02/2018 CLINICAL DATA:  Diarrhea for several days. Positive C difficile. Evaluation for megacolon. EXAM: ABDOMEN - 2 VIEW COMPARISON:  CT AP 02/21/2017 FINDINGS: Nonobstructed, nondistended bowel gas pattern. No free air. No organomegaly is noted. Two rounded calcifications seen in the right upper quadrant may reflect the patient's known gallstones. Gentle levoconvex curvature of the lumbar spine with degenerative disc disease and endplate spurring. IMPRESSION: Nonspecific nonobstructive bowel gas pattern. No significant small nor large bowel dilatation is  seen. Cholelithiasis. Electronically Signed    By: Ashley Royalty M.D.   On: 12/02/2018 18:16    ASSESSMENT AND PLAN:   Active Problems:   C. 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     At this time will start on oral vancomycin.  Will need to treat for at least 14 days.  Added probiotics.  Avoid laxatives or lactulose.  Ammonia level is normal.  Isolation ordered. Continue rectal tube until he has liquidy stool because of his limited mobility currently.  *CKD stage IV is stable.  Monitor for dehydration and worsening.  *Hypokalemia secondary to severe diarrhea.  Replace orally.  Telemetry monitoring for 24-hour ordered.  *Diabetes mellitus.  Sliding scale insulin ordered.  Diabetic diet.  *Cirrhosis.  Normal ammonia.  No lactulose. Currently stable.  Follows with GI as outpatient.  * DVT prophylaxis with renally dosed Lovenox  *Recent fracture and surgery on his ankles and foot Spoke to Ortho to guide about weightbearing status and need for orthopedic shoes. As per their advice, continue to wear the Ortho boot on left foot while having full weightbearing.  Get physical therapy.  No need to wear boot on the right side.   All the records are reviewed and case discussed with Care Management/Social Workerr. Management plans discussed with the patient, family and they are in agreement.  CODE STATUS: Full  TOTAL TIME TAKING CARE OF THIS PATIENT: 35 minutes.     POSSIBLE D/C IN 1-2 DAYS, DEPENDING ON CLINICAL CONDITION.   [name] M.D on 12/04/2018   Between 7am to 6pm - Pager - [phone]  After 6pm go to [email or web address] - password [email or web address]  Office  [phone]  CC: Primary care physician; Baxter Hire, MD  Note: This dictation was prepared with Dragon dictation along with smaller phrase technology. Any transcriptional errors that result from this process are unintentional.

## 2018-12-04 NOTE — Plan of Care (Signed)
  Problem: Health Behavior/Discharge Planning: Goal: Ability to manage health-related needs will improve Outcome: Progressing   Problem: Coping: Goal: Level of anxiety will decrease Outcome: Progressing   

## 2018-12-04 NOTE — Progress Notes (Signed)
Inpatient Diabetes Program Recommendations  AACE/ADA: New Consensus Statement on Inpatient Glycemic Control (2015)  Target Ranges:  Prepandial:   less than 140 mg/dL      [facility] postprandial:   less than 180 mg/dL (1-2 hours)      Critically ill patients:  140 - 180 mg/dL   Lab Results  Component Value Date   GLUCAP 249 (H) 12/03/2018   HGBA1C 6.8 (H) 12/02/2018    Review of Glycemic Control Results for RANCE, SMITHSON (MRN [id number]) as of 12/04/2018 12:09  Ref. Range 12/02/2018 21:52 12/03/2018 07:39 12/03/2018 11:57 12/03/2018 16:48 12/03/2018 20:40  Glucose-Capillary Latest Ref Range: 70 - 99 mg/dL 99 165 (H) 272 (H) 234 (H) 249 (H)   Diabetes history: DM2 Outpatient Diabetes medications:  Novolog 70/30 85 units with breakfast and Novolog 70/30 72 units q HS-Per SNF Current orders for Inpatient glycemic control:  Novolog 70/30 70 units with breakfast and Novolog 70/30 50 units at bedtime Novolog sensitive tid with meals and HS Inpatient Diabetes Program Recommendations:    Please consider changing Novolog 70/30 50 units to with supper at 1700 as this insulin should be given with meal.   Thanks  Adah Perl, RN, BC-ADM Inpatient Diabetes Coordinator Pager [phone] (8a-5p)

## 2018-12-04 NOTE — Evaluation (Signed)
Physical Therapy Evaluation Patient Details Name: Corey West MRN: [id number] DOB: [birth year] Today's Date: 12/04/2018   History of Present Illness  68 y.o. male with a known history of CKD stage IV, hypertension, diabetes mellitus, cirrhosis, recurrent admissions for hepatic encephalopathy presently in skilled nursing facility secondary to bilateral ankle fractures presents to the emergency room due to weakness, severe diarrhea and C. difficile test positive.  Clinical Impression  Pt showed good effort t/o PT exam and additional mobility and gait training.  He did need some assist getting to EOB and (from elevated bed) and managed to walk ~20 ft with slow, hesitant, UE reliant gait.  Discussed with pt the difficulty he would have managing at home and what to speak of the safety issues inherent in needing UEs for everything secondary to subacute ankle fxs.  Pt understands that more time at SNF would be much safer but is eager to get home.     Follow Up Recommendations SNF    Equipment Recommendations  (may need R walking boot)    Recommendations for Other Services       Precautions / Restrictions Precautions Precautions: Fall(enteric) Required Braces or Orthoses: (L walking boot) Restrictions Other Position/Activity Restrictions: must have L walking boot and RW for "full" WBing on L      Mobility  Bed Mobility Overal bed mobility: Needs Assistance Bed Mobility: Supine to Sit     Supine to sit: Min assist     General bed mobility comments: Pt was able to initiate transition to EOB, but needed some assist to lift legs enough to clear EOB to attain sitting  Transfers Overall transfer level: Needs assistance Equipment used: Rolling walker (2 wheeled) Transfers: Sit to/from Stand Sit to Stand: Min assist         General transfer comment: Elevated bed 2-3 inches, pt was able to rise w/o heavy assist but did need help to keep weight coming forward during  transition  Ambulation/Gait Ambulation/Gait assistance: Min guard Gait Distance (Feet): 20 Feet Assistive device: Rolling walker (2 wheeled)       General Gait Details: Pt with very slow and cautious gait.  [employer] broker on R (boot on L), pt c/o some calcaneal pain but did not have severe limp, just slow and guarded effort.   Stairs            Wheelchair Mobility    Modified Rankin (Stroke Patients Only)       Balance Overall balance assessment: Needs assistance Sitting-balance support: Bilateral upper extremity supported Sitting balance-Leahy Scale: Good     Standing balance support: Bilateral upper extremity supported Standing balance-Leahy Scale: Fair Standing balance comment: highly reliant on the walker but able to maintain upright standing                              Pertinent Vitals/Pain Pain Assessment: 0-10 Pain Score: 5  Pain Location: R heel pain during ambulation     Home Living Family/patient expects to be discharged to:: Skilled nursing facility                 Additional Comments: Pt very much wants to go home, but does realize how hard and unsafe that would be.    Prior Function Level of Independence: Independent with assistive device(s)         Comments: Prior to 3 months ago he was working, driving, walking with [place].  Has started doing  some walking at rehab now that [facility] is allowed through ankles     Hand Dominance        Extremity/Trunk Assessment   Upper Extremity Assessment Upper Extremity Assessment: Generalized weakness    Lower Extremity Assessment Lower Extremity Assessment: Generalized weakness       Communication   Communication: No difficulties  Cognition Arousal/Alertness: Awake/alert Behavior During Therapy: WFL for tasks assessed/performed                                          General Comments      Exercises     Assessment/Plan    PT Assessment Patient  needs continued PT services  PT Problem List Decreased strength;Decreased activity tolerance;Decreased range of motion;Decreased mobility;Decreased balance;Decreased coordination;Decreased cognition;Decreased safety awareness;Decreased knowledge of precautions;Decreased knowledge of use of DME;Pain       PT Treatment Interventions DME instruction;Gait training;Stair training;Functional mobility training;Therapeutic activities;Therapeutic exercise;Balance training;Patient/family education;Neuromuscular re-education    PT Goals (Current goals can be found in the Care Plan section)  Acute Rehab PT Goals Patient Stated Goal: pt very much wishes to go home PT Goal Formulation: With patient Time For Goal Achievement: 12/18/18 Potential to Achieve Goals: Fair    Frequency Min 2X/week   Barriers to discharge        Co-evaluation               AM-PAC PT "6 Clicks" Mobility  Outcome Measure Help needed turning from your back to your side while in a flat bed without using bedrails?: A Little Help needed moving from lying on your back to sitting on the side of a flat bed without using bedrails?: A Little Help needed moving to and from a bed to a chair (including a wheelchair)?: A Little Help needed standing up from a chair using your arms (e.g., wheelchair or bedside chair)?: A Little Help needed to walk in hospital room?: A Little Help needed climbing 3-5 steps with a railing? : A Lot 6 Click Score: 17    End of Session Equipment Utilized During Treatment: Gait belt(L walking boot) Activity Tolerance: Patient tolerated treatment well Patient left: with chair alarm set;with call bell/phone within reach Nurse Communication: Mobility status PT Visit Diagnosis: Muscle weakness (generalized) (M62.81);Difficulty in walking, not elsewhere classified (R26.2);Unsteadiness on feet (R26.81)    Time: 5784-6962 PT Time Calculation (min) (ACUTE ONLY): 37 min   Charges:   PT Evaluation $PT  Eval Low Complexity: 1 Low PT Treatments $Gait Training: 8-22 mins        Kreg Shropshire, DPT 12/04/2018, 5:12 PM

## 2018-12-04 NOTE — Clinical Social Work Note (Signed)
Clinical Social Work Assessment  Patient Details  Name: Corey West MRN: [id number] Date of Birth: August 04, 1951  Date of referral:  12/04/18               Reason for consult:  Discharge Planning                Permission sought to share information with:  Facility Art therapist granted to share information::  Yes, Verbal Permission Granted  Name::        Agency::     Relationship::     Contact Information:     Housing/Transportation Living arrangements for the past 2 months:  Apartment, [place] of Information:  Patient Patient Interpreter Needed:  None Criminal Activity/Legal Involvement Pertinent to Current Situation/Hospitalization:  No - Comment as needed Significant Relationships:  Other(Comment)(niece and nephew) Lives with:  Self Do you feel safe going back to the place where you live?  Yes Need for family participation in patient care:  Yes (Comment)  Care giving concerns:  Patient resides at home alone but has been at [facility] for short term rehab.   Social Worker assessment / plan:  CSW spoke with patient regarding PT's recommendation for short term rehab. Patient had asked the RN CM how many days he has used at [facility]. CSW spoke with Tammy at [facility] and obtained the days he had used under his medicare. CSW spoke with patient and answered the questions he had. He is still leaning toward going home and feels that he will do better with PT tomorrow.   Employment status:    Insurance information:  Medicare PT Recommendations:  [place] / Referral to community resources:     Patient/Family's Response to care:  Patient expressed appreciation for CSW assistance.   Patient/Family's Understanding of and Emotional Response to Diagnosis, Current Treatment, and Prognosis:  Patient is involved in his treatment plan and is hopeful he can return home. He acknowledged that he did not do well with PT today.  Emotional  Assessment Appearance:  Appears stated age Attitude/Demeanor/Rapport:  (pleasant and cooperative) Affect (typically observed):  Calm, Happy, Hopeful(talkative) Orientation:  Oriented to Self, Oriented to Place, Oriented to  Time, Oriented to Situation Alcohol / Substance use:  Not Applicable Psych involvement (Current and /or in the community):  No (Comment)  Discharge Needs  Concerns to be addressed:  Care Coordination Readmission within the last 30 days:  No Current discharge risk:  None Barriers to Discharge:  No Barriers Identified   Shela Leff, LCSW 12/04/2018, 4:20 PM

## 2018-12-04 NOTE — NC FL2 (Signed)
[place] LEVEL OF CARE SCREENING TOOL     IDENTIFICATION  Patient Name: Corey West Birthdate: 24-Mar-1951 Sex: male Admission Date (Current Location): 12/02/2018  [place] and [place] Number:  Engineering geologist and Address:  [facility], [address] [place],  [address]      Provider Number: [id number]  Attending Physician Name and Address:  [name], *  Relative Name and Phone Number:       Current Level of Care: Hospital Recommended Level of Care: [place] Prior Approval Number:    Date Approved/Denied:   PASRR Number:    Discharge Plan: SNF    Current Diagnoses: Patient Active Problem List   Diagnosis Date Noted  . Malnutrition of moderate degree 12/03/2018  . C. difficile diarrhea 12/02/2018  . Ankle fracture, left, closed, initial encounter 10/15/2018  . Ankle fracture, right, closed, initial encounter 10/15/2018  . Ankle fracture 10/15/2018  . Pressure injury of skin 07/19/2018  . UTI (urinary tract infection) 07/18/2018  . Goals of care, counseling/discussion   . Palliative care encounter   . Hepatic encephalopathy syndrome ([place]) 12/16/2017  . Hepatic encephalopathy ([place]) 10/18/2017  . Hypokalemia 10/09/2017  . Hyperglycemia 10/09/2017  . Symptomatic anemia 06/26/2017  . Sepsis ([place]) 06/12/2017  . Cellulitis in diabetic foot ([place]) 02/21/2017  . Uncontrolled diabetes mellitus ([place]) 02/21/2017  . Cellulitis 02/21/2017  . GI bleed 07/20/2015  . Hematemesis 02/08/2015  . Cirrhosis ([place]) 02/08/2015  . Esophageal varices ([place]) 02/08/2015  . Type 2 diabetes mellitus ([place]) 02/08/2015  . Gastroesophageal reflux disease 02/08/2015  . Hyperlipidemia 02/08/2015  . Iron deficiency anemia 12/04/2013  . Thrombocytopenia ([place]) 12/04/2013  . Splenomegaly 11/04/2013    Orientation RESPIRATION BLADDER Height & Weight     Self, Time, Situation, Place  Normal Incontinent Weight: 270 lb  1 oz (122.5 kg) Height:  5\' 10"  (177.8 cm)  BEHAVIORAL SYMPTOMS/MOOD NEUROLOGICAL BOWEL NUTRITION STATUS  (none) (none) Incontinent    AMBULATORY STATUS COMMUNICATION OF NEEDS Skin   Extensive Assist Verbally Normal                       Personal Care Assistance Level of Assistance  Bathing, Dressing Bathing Assistance: Maximum assistance   Dressing Assistance: Maximum assistance     Functional Limitations Info  (none)          SPECIAL CARE FACTORS FREQUENCY  PT (By licensed PT), OT (By licensed OT)                    Contractures Contractures Info: Not present    Additional Factors Info  Code Status Code Status Info: full             Current Medications (12/04/2018):  This is the current hospital active medication list Current Facility-Administered Medications  Medication Dose Route Frequency Provider Last Rate Last Dose  . acetaminophen (TYLENOL) tablet 650 mg  650 mg Oral Q6H PRN Hillary Bow, MD       Or  . acetaminophen (TYLENOL) suppository 650 mg  650 mg Rectal Q6H PRN Sudini, Srikar, MD      . albuterol (PROVENTIL) (2.5 MG/3ML) 0.083% nebulizer solution 2.5 mg  2.5 mg Nebulization Q2H PRN Sudini, Srikar, MD      . alum & mag hydroxide-simeth (MAALOX/MYLANTA) 200-200-20 MG/5ML suspension 30 mL  30 mL Oral Q4H PRN Sudini, Srikar, MD      . enoxaparin (LOVENOX) injection 40 mg  40  mg Subcutaneous Q24H Hillary Bow, MD   40 mg at 12/03/18 1717  . ferrous sulfate tablet 325 mg  325 mg Oral BID WC Hillary Bow, MD   325 mg at 12/04/18 0849  . HYDROcodone-acetaminophen (NORCO/VICODIN) 5-325 MG per tablet 1-2 tablet  1-2 tablet Oral Q6H PRN Sudini, Alveta Heimlich, MD      . insulin aspart (novoLOG) injection 0-5 Units  0-5 Units Subcutaneous QHS Hillary Bow, MD   2 Units at 12/03/18 2049  . insulin aspart (novoLOG) injection 0-9 Units  0-9 Units Subcutaneous TID WC Hillary Bow, MD   3 Units at 12/04/18 [phone]  . insulin aspart protamine- aspart (NOVOLOG  MIX 70/30) injection 50 Units  50 Units Subcutaneous QHS Hillary Bow, MD   50 Units at 12/03/18 2048  . insulin aspart protamine- aspart (NOVOLOG MIX 70/30) injection 70 Units  70 Units Subcutaneous Q breakfast Hillary Bow, MD   70 Units at 12/04/18 [phone]  . LORazepam (ATIVAN) tablet 0.5 mg  0.5 mg Oral Q8H PRN Hillary Bow, MD   0.5 mg at 12/04/18 0617  . ondansetron (ZOFRAN) tablet 4 mg  4 mg Oral Q6H PRN Hillary Bow, MD       Or  . ondansetron (ZOFRAN) injection 4 mg  4 mg Intravenous Q6H PRN Sudini, Srikar, MD      . pantoprazole (PROTONIX) EC tablet 40 mg  40 mg Oral Daily Hillary Bow, MD   40 mg at 12/04/18 0849  . potassium chloride (K-DUR,KLOR-CON) CR tablet 20 mEq  20 mEq Oral BID [name], MD   20 mEq at 12/04/18 0849  . protein supplement (ENSURE MAX) liquid  11 oz Oral BID [name], MD   11 oz at 12/04/18 1001  . rifaximin (XIFAXAN) tablet 550 mg  550 mg Oral BID Hillary Bow, MD   550 mg at 12/04/18 0849  . saccharomyces boulardii (FLORASTOR) capsule 250 mg  250 mg Oral BID Hillary Bow, MD   250 mg at 12/04/18 0850  . sodium chloride flush (NS) 0.9 % injection 3 mL  3 mL Intravenous Q12H Sudini, Srikar, MD   3 mL at 12/04/18 1000  . vancomycin (VANCOCIN) 50 mg/mL oral solution 125 mg  125 mg Oral QID Delman Kitten, MD   125 mg at 12/04/18 1000  . zinc sulfate capsule 220 mg  220 mg Oral Daily Hillary Bow, MD   220 mg at 12/04/18 0263     Discharge Medications: Please see discharge summary for a list of discharge medications.  Relevant Imaging Results:  Relevant Lab Results:   Additional Information ss: [id number]  Shela Leff, LCSW

## 2018-12-05 LAB — COMPREHENSIVE METABOLIC PANEL
ALT: 41 U/L (ref 0–44)
AST: 53 U/L — ABNORMAL HIGH (ref 15–41)
Albumin: 2.4 g/dL — ABNORMAL LOW (ref 3.5–5.0)
Alkaline Phosphatase: 78 U/L (ref 38–126)
Anion gap: 3 — ABNORMAL LOW (ref 5–15)
BUN: 33 mg/dL — ABNORMAL HIGH (ref 8–23)
CO2: 25 mmol/L (ref 22–32)
Calcium: 7.5 mg/dL — ABNORMAL LOW (ref 8.9–10.3)
Chloride: 102 mmol/L (ref 98–111)
Creatinine, Ser: 1.69 mg/dL — ABNORMAL HIGH (ref 0.61–1.24)
GFR calc Af Amer: 48 mL/min — ABNORMAL LOW (ref >60)
GFR calc non Af Amer: 41 mL/min — ABNORMAL LOW (ref >60)
GLUCOSE: 136 mg/dL — AB (ref 70–99)
Potassium: 3.5 mmol/L (ref 3.5–5.1)
Sodium: 130 mmol/L — ABNORMAL LOW (ref 135–145)
Total Bilirubin: 0.6 mg/dL (ref 0.3–1.2)
Total Protein: 5.8 g/dL — ABNORMAL LOW (ref 6.5–8.1)

## 2018-12-05 LAB — CBC
HCT: 29.2 % — ABNORMAL LOW (ref 39.0–52.0)
Hemoglobin: 10 g/dL — ABNORMAL LOW (ref 13.0–17.0)
MCH: 29.4 pg (ref 26.0–34.0)
MCHC: 34.2 g/dL (ref 30.0–36.0)
MCV: 85.9 fL (ref 80.0–100.0)
Platelets: 170 10*3/uL (ref 150–400)
RBC: 3.4 MIL/uL — ABNORMAL LOW (ref 4.22–5.81)
RDW: 14.4 % (ref 11.5–15.5)
WBC: 15.4 10*3/uL — ABNORMAL HIGH (ref 4.0–10.5)
nRBC: 0 % (ref 0.0–0.2)

## 2018-12-05 LAB — GLUCOSE, CAPILLARY
GLUCOSE-CAPILLARY: 126 mg/dL — AB (ref 70–99)
Glucose-Capillary: 171 mg/dL — ABNORMAL HIGH (ref 70–99)
Glucose-Capillary: 219 mg/dL — ABNORMAL HIGH (ref 70–99)
Glucose-Capillary: 196 mg/dL — ABNORMAL HIGH (ref 70–99)

## 2018-12-05 LAB — MAGNESIUM: Magnesium: 2.1 mg/dL (ref 1.7–2.4)

## 2018-12-05 MED ORDER — POTASSIUM CHLORIDE CRYS ER 20 MEQ PO TBCR
40.0000 meq | EXTENDED_RELEASE_TABLET | Freq: Two times a day (BID) | ORAL | Status: AC
  Administered 2018-12-05 – 2018-12-06 (×4): 40 meq via ORAL
  Filled 2018-12-05 (×4): qty 2

## 2018-12-05 NOTE — Clinical Social Work Note (Signed)
Patient had questions about him returning to [facility] yesterday and CSW was able to answer questions. Patient then began saying he will return home at discharge. Tam[facility] at [facility] has stated that they can take patient back if patient chooses to resume his short term rehab. Shela Leff MSW,LCSW [phone]

## 2018-12-05 NOTE — Care Management Important Message (Signed)
Important Message  Patient Details  Name: Corey West MRN: [id number] Date of Birth: 1951/05/24   Medicare Important Message Given:  Yes    Beverly Sessions, RN 12/05/2018, 3:17 PM

## 2018-12-05 NOTE — Progress Notes (Signed)
[place] at [place] NAME: Corey West    MR#:  [id number]  DATE OF BIRTH:  16-Dec-1950  SUBJECTIVE:  CHIEF COMPLAINT:   Chief Complaint  Patient presents with  . Diarrhea   History of C. difficile colitis.  Came with recurrence of diarrhea and noted to have positive for C. difficile.  Denies any new complaints. Still have liquidy stool and rectal tube for drainage.  REVIEW OF SYSTEMS:  CONSTITUTIONAL: No fever, have fatigue or weakness.  EYES: No blurred or double vision.  EARS, NOSE, AND THROAT: No tinnitus or ear pain.  RESPIRATORY: No cough, shortness of breath, wheezing or hemoptysis.  CARDIOVASCULAR: No chest pain, orthopnea, edema.  GASTROINTESTINAL: No nausea, vomiting, and no diarrhea or abdominal pain.  GENITOURINARY: No dysuria, hematuria.  ENDOCRINE: No polyuria, nocturia,  HEMATOLOGY: No anemia, easy bruising or bleeding SKIN: No rash or lesion. MUSCULOSKELETAL: No joint pain or arthritis.   NEUROLOGIC: No tingling, numbness, weakness.  PSYCHIATRY: No anxiety or depression.   ROS  DRUG ALLERGIES:  No Known Allergies  VITALS:  Blood pressure (!) 139/52, pulse 73, temperature 98.5 F (36.9 C), temperature source Oral, resp. rate 20, height 5\' 10"  (1.778 m), weight 122.5 kg, SpO2 97 %.  PHYSICAL EXAMINATION:  GENERAL:  68 y.o.-year-old patient lying in the bed with no acute distress.  EYES: Pupils equal, round, reactive to light and accommodation. No scleral icterus. Extraocular muscles intact.  HEENT: Head atraumatic, normocephalic. Oropharynx and nasopharynx clear.  NECK:  Supple, no jugular venous distention. No thyroid enlargement, no tenderness.  LUNGS: Normal breath sounds bilaterally, no wheezing, rales,rhonchi or crepitation. No use of accessory muscles of respiration.  CARDIOVASCULAR: S1, S2 normal. No murmurs, rubs, or gallops.  ABDOMEN: Soft, nontender, nondistended. Bowel sounds present. No organomegaly or  mass.  Rectal tube with liquidy stool present. EXTREMITIES: No pedal edema, cyanosis, or clubbing.  Her bandage on left foot, he confirms that it was applied to fit his Ortho boot properly so that he does not scratch his skin. NEUROLOGIC: Cranial nerves II through XII are intact. Muscle strength 4/5 in all extremities. Sensation intact. Gait not checked.  PSYCHIATRIC: The patient is alert and oriented x 3.  SKIN: No obvious rash, lesion, or ulcer.   Physical Exam LABORATORY PANEL:   CBC Recent Labs  Lab 12/05/18 0549  WBC 15.4*  HGB 10.0*  HCT 29.2*  PLT 170   ------------------------------------------------------------------------------------------------------------------  Chemistries  Recent Labs  Lab 12/05/18 0549  NA 130*  K 3.5  CL 102  CO2 25  GLUCOSE 136*  BUN 33*  CREATININE 1.69*  CALCIUM 7.5*  MG 2.1  AST 53*  ALT 41  ALKPHOS 78  BILITOT 0.6   ------------------------------------------------------------------------------------------------------------------  Cardiac Enzymes No results for input(s): TROPONINI in the last 168 hours. ------------------------------------------------------------------------------------------------------------------  RADIOLOGY:  No results found.  ASSESSMENT AND PLAN:   Active Problems:   C. 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     At this time will start on oral vancomycin.  Will need to treat for at least 14 days.  Added probiotics.  Avoid laxatives or lactulose.  Ammonia level is normal.  Isolation ordered. Continue rectal tube until he has liquidy stool because of his limited mobility currently.  *CKD stage IV is stable.  Monitor for dehydration and worsening.  *Hypokalemia secondary to severe diarrhea.  Replace orally.  Telemetry monitoring for 24-hour ordered.  *  Diabetes mellitus.  Sliding scale insulin ordered.  Diabetic  diet.  *Cirrhosis.  Normal ammonia.  No lactulose. Currently stable.  Follows with GI as outpatient.  * DVT prophylaxis with renally dosed Lovenox  *Recent fracture and surgery on his ankles and foot Spoke to Ortho to guide about weightbearing status and need for orthopedic shoes. As per their advice, continue to wear the Ortho boot on left foot while having full weightbearing.  Get physical therapy.  No need to wear boot on the right side.  Patient wishes to go home at the end of this hospitalization rather than going back to [facility]. PT had suggested SNF.  He will continue to work with physical therapy over the next day or 2 while he is still here due to C. difficile.   All the records are reviewed and case discussed with Care Management/Social Workerr. Management plans discussed with the patient, family and they are in agreement.  CODE STATUS: Full  TOTAL TIME TAKING CARE OF THIS PATIENT: 35 minutes.     POSSIBLE D/C IN 1-2 DAYS, DEPENDING ON CLINICAL CONDITION.   [name] M.D on 12/05/2018   Between 7am to 6pm - Pager - [phone]  After 6pm go to [email or web address] - password [email or web address] [facility]  Office  [phone]  CC: Primary care physician; Baxter Hire, MD  Note: This dictation was prepared with Dragon dictation along with smaller phrase technology. Any transcriptional errors that result from this process are unintentional.

## 2018-12-06 LAB — BASIC METABOLIC PANEL
Anion gap: 6 (ref 5–15)
BUN: 32 mg/dL — ABNORMAL HIGH (ref 8–23)
CO2: 26 mmol/L (ref 22–32)
Calcium: 8.1 mg/dL — ABNORMAL LOW (ref 8.9–10.3)
Chloride: 102 mmol/L (ref 98–111)
Creatinine, Ser: 1.69 mg/dL — ABNORMAL HIGH (ref 0.61–1.24)
GFR calc non Af Amer: 41 mL/min — ABNORMAL LOW (ref >60)
GFR, EST AFRICAN AMERICAN: 48 mL/min — AB (ref >60)
Glucose, Bld: 135 mg/dL — ABNORMAL HIGH (ref 70–99)
Potassium: 4.3 mmol/L (ref 3.5–5.1)
Sodium: 134 mmol/L — ABNORMAL LOW (ref 135–145)

## 2018-12-06 LAB — CBC
HCT: 29.1 % — ABNORMAL LOW (ref 39.0–52.0)
Hemoglobin: 10 g/dL — ABNORMAL LOW (ref 13.0–17.0)
MCH: 29.8 pg (ref 26.0–34.0)
MCHC: 34.4 g/dL (ref 30.0–36.0)
MCV: 86.6 fL (ref 80.0–100.0)
Platelets: 152 K/uL (ref 150–400)
RBC: 3.36 MIL/uL — ABNORMAL LOW (ref 4.22–5.81)
RDW: 14.4 % (ref 11.5–15.5)
WBC: 12.1 K/uL — ABNORMAL HIGH (ref 4.0–10.5)
nRBC: 0 % (ref 0.0–0.2)

## 2018-12-06 MED ORDER — INSULIN ASPART PROT & ASPART (70-30 MIX) 100 UNIT/ML ~~LOC~~ SUSP
40.0000 [IU] | Freq: Every day | SUBCUTANEOUS | Status: DC
  Administered 2018-12-06 – 2018-12-08 (×3): 40 [IU] via SUBCUTANEOUS
  Filled 2018-12-06 (×3): qty 10

## 2018-12-06 MED ORDER — FUROSEMIDE 40 MG PO TABS
40.0000 mg | ORAL_TABLET | Freq: Every day | ORAL | Status: DC
  Administered 2018-12-06 – 2018-12-09 (×4): 40 mg via ORAL
  Filled 2018-12-06 (×4): qty 1

## 2018-12-06 MED ORDER — INSULIN ASPART PROT & ASPART (70-30 MIX) 100 UNIT/ML ~~LOC~~ SUSP
60.0000 [IU] | Freq: Every day | SUBCUTANEOUS | Status: DC
  Administered 2018-12-07 – 2018-12-09 (×3): 60 [IU] via SUBCUTANEOUS
  Filled 2018-12-06 (×3): qty 10

## 2018-12-06 NOTE — Progress Notes (Signed)
[place] at [place] NAME: Corey West    MR#:  [id number]  DATE OF BIRTH:  04-Jan-1951  SUBJECTIVE:  CHIEF COMPLAINT:   Chief Complaint  Patient presents with  . Diarrhea   Continues to have watery diarrhea  REVIEW OF SYSTEMS:  CONSTITUTIONAL: No fever, have fatigue or weakness.  EYES: No blurred or double vision.  EARS, NOSE, AND THROAT: No tinnitus or ear pain.  RESPIRATORY: No cough, shortness of breath, wheezing or hemoptysis.  CARDIOVASCULAR: No chest pain, orthopnea, edema.  GASTROINTESTINAL: No nausea, vomiting, and no diarrhea or abdominal pain.  GENITOURINARY: No dysuria, hematuria.  ENDOCRINE: No polyuria, nocturia,  HEMATOLOGY: No anemia, easy bruising or bleeding SKIN: No rash or lesion. MUSCULOSKELETAL: No joint pain or arthritis.   NEUROLOGIC: No tingling, numbness, weakness.  PSYCHIATRY: No anxiety or depression.   ROS  DRUG ALLERGIES:  No Known Allergies  VITALS:  Blood pressure (!) 130/57, pulse 67, temperature 97.9 F (36.6 C), temperature source Oral, resp. rate 16, height 5\' 10"  (1.778 m), weight 126.3 kg, SpO2 100 %.  PHYSICAL EXAMINATION:  GENERAL:  68 y.o.-year-old patient lying in the bed with no acute distress.  EYES: Pupils equal, round, reactive to light and accommodation. No scleral icterus. Extraocular muscles intact.  HEENT: Head atraumatic, normocephalic. Oropharynx and nasopharynx clear.  NECK:  Supple, no jugular venous distention. No thyroid enlargement, no tenderness.  LUNGS: Normal breath sounds bilaterally, no wheezing, rales,rhonchi or crepitation. No use of accessory muscles of respiration.  CARDIOVASCULAR: S1, S2 normal. No murmurs, rubs, or gallops.  ABDOMEN: Soft, nontender, nondistended. Bowel sounds present. No organomegaly or mass.  Rectal tube with liquidy stool present. EXTREMITIES: No pedal edema, cyanosis, or clubbing.  Her bandage on left foot, he confirms that it was applied to fit  his Ortho boot properly so that he does not scratch his skin. NEUROLOGIC: Cranial nerves II through XII are intact. Muscle strength 4/5 in all extremities. Sensation intact. Gait not checked.  PSYCHIATRIC: The patient is alert and oriented x 3.  SKIN: No obvious rash, lesion, or ulcer.   Physical Exam LABORATORY PANEL:   CBC Recent Labs  Lab 12/06/18 0611  WBC 12.1*  HGB 10.0*  HCT 29.1*  PLT 152   ------------------------------------------------------------------------------------------------------------------  Chemistries  Recent Labs  Lab 12/05/18 0549 12/06/18 0611  NA 130* 134*  K 3.5 4.3  CL 102 102  CO2 25 26  GLUCOSE 136* 135*  BUN 33* 32*  CREATININE 1.69* 1.69*  CALCIUM 7.5* 8.1*  MG 2.1  --   AST 53*  --   ALT 41  --   ALKPHOS 78  --   BILITOT 0.6  --    ------------------------------------------------------------------------------------------------------------------  Cardiac Enzymes No results for input(s): TROPONINI in the last 168 hours. ------------------------------------------------------------------------------------------------------------------  RADIOLOGY:  No results found.  ASSESSMENT AND PLAN:   Active Problems:   C. difficile diarrhea   Malnutrition of moderate degree  *C. difficile diarrhea  oral vancomycin.    Added probiotics.   Avoid laxatives or lactulose.   Isolation ordered. Continue rectal tube until he has liquidy stool because of his limited mobility currently.  *CKD stage IV is stable.  Monitor for dehydration and worsening.  *Hypokalemia secondary to severe diarrhea.  Replaced orally.  Normal  *Diabetes mellitus.  Sliding scale insulin ordered.  Diabetic diet.  *Cirrhosis.  Normal ammonia.  No lactulose. Currently stable.  Follows with GI as outpatient.  * DVT prophylaxis with renally dosed  Lovenox  *Recent fracture and surgery on his ankles and foot Spoke to Ortho to guide about weightbearing status  and need for orthopedic shoes. As per their advice, continue to wear the Ortho boot on left foot while having full weightbearing.  No need to wear boot on the right side.  All the records are reviewed and case discussed with Care Management/Social Worker Management plans discussed with the patient, family and they are in agreement.  CODE STATUS: Full  TOTAL TIME TAKING CARE OF THIS PATIENT: 35 minutes.   POSSIBLE D/C IN 1-2 DAYS, DEPENDING ON CLINICAL CONDITION.   Neita Carp M.D on 12/06/2018   Between 7am to 6pm - Pager - [phone]  After 6pm go to [email or web address] - password [email or web address]  Office  [phone]  CC: Primary care physician; Baxter Hire, MD  Note: This dictation was prepared with Dragon dictation along with smaller phrase technology. Any transcriptional errors that result from this process are unintentional.

## 2018-12-06 NOTE — Progress Notes (Signed)
MD made aware that CBG meter is not linking to pt.'s chart. MD also made aware that pt.'s CBG at 0730 was 110, 184 @1300 , and 222 @ 1640. Sliding scale coverage given per parameters. Nursing staff has used multiple CBG meters with no success linking to pt.'s chart.  IT work request placed to fix CBG meter. No new orders.   Geneieve Duell [employer]

## 2018-12-07 NOTE — Progress Notes (Signed)
[place] at [place] NAME: Corey West    MR#:  [id number]  DATE OF BIRTH:  26-Feb-1951  SUBJECTIVE:  CHIEF COMPLAINT:   Chief Complaint  Patient presents with  . Diarrhea   Continues to have diarrhea but more formed.  Has rectal tube.  Afebrile.  REVIEW OF SYSTEMS:  CONSTITUTIONAL: No fever, have fatigue or weakness.  EYES: No blurred or double vision.  EARS, NOSE, AND THROAT: No tinnitus or ear pain.  RESPIRATORY: No cough, shortness of breath, wheezing or hemoptysis.  CARDIOVASCULAR: No chest pain, orthopnea, edema.  GASTROINTESTINAL: No nausea, vomiting, Positive  diarrhea  GENITOURINARY: No dysuria, hematuria.  ENDOCRINE: No polyuria, nocturia,  HEMATOLOGY: No anemia, easy bruising or bleeding SKIN: No rash or lesion. MUSCULOSKELETAL: No joint pain or arthritis.   NEUROLOGIC: No tingling, numbness, weakness.  PSYCHIATRY: No anxiety or depression.   ROS  DRUG ALLERGIES:  No Known Allergies  VITALS:  Blood pressure (!) 148/52, pulse 71, temperature 97.8 F (36.6 C), temperature source Oral, resp. rate 16, height 5\' 10"  (1.778 m), weight 127.2 kg, SpO2 99 %.  PHYSICAL EXAMINATION:  GENERAL:  68 y.o.-year-old patient lying in the bed with no acute distress.  Obese EYES: Pupils equal, round, reactive to light and accommodation. No scleral icterus. Extraocular muscles intact.  HEENT: Head atraumatic, normocephalic. Oropharynx and nasopharynx clear.  NECK:  Supple, no jugular venous distention. No thyroid enlargement, no tenderness.  LUNGS: Normal breath sounds bilaterally, no wheezing, rales,rhonchi or crepitation. No use of accessory muscles of respiration.  CARDIOVASCULAR: S1, S2 normal. No murmurs, rubs, or gallops.  ABDOMEN: Soft, nontender, nondistended. Bowel sounds present. No organomegaly or mass.  Rectal tube with liquidy stool present. EXTREMITIES: No pedal edema, cyanosis, or clubbing.  Her bandage on left foot, he  confirms that it was applied to fit his Ortho boot properly so that he does not scratch his skin. NEUROLOGIC: Cranial nerves II through XII are intact. Muscle strength 4/5 in all extremities. Sensation intact. Gait not checked.  PSYCHIATRIC: The patient is alert and oriented x 3.  SKIN: No obvious rash, lesion, or ulcer.   Physical Exam LABORATORY PANEL:   CBC Recent Labs  Lab 12/06/18 0611  WBC 12.1*  HGB 10.0*  HCT 29.1*  PLT 152   ------------------------------------------------------------------------------------------------------------------  Chemistries  Recent Labs  Lab 12/05/18 0549 12/06/18 0611  NA 130* 134*  K 3.5 4.3  CL 102 102  CO2 25 26  GLUCOSE 136* 135*  BUN 33* 32*  CREATININE 1.69* 1.69*  CALCIUM 7.5* 8.1*  MG 2.1  --   AST 53*  --   ALT 41  --   ALKPHOS 78  --   BILITOT 0.6  --    ------------------------------------------------------------------------------------------------------------------  Cardiac Enzymes No results for input(s): TROPONINI in the last 168 hours. ------------------------------------------------------------------------------------------------------------------  RADIOLOGY:  No results found.  ASSESSMENT AND PLAN:   Active Problems:   C. difficile diarrhea   Malnutrition of moderate degree  *C. difficile diarrhea  oral vancomycin, probiotics.   Avoid laxatives or lactulose.   Isolation ordered. Continue rectal tube until he has liquidy stool because of his limited mobility currently. Likely discharge tomorrow if diarrhea improves.  *CKD stage IV is stable.  Monitor for dehydration and worsening.  *Hypokalemia secondary to severe diarrhea.  Replaced orally.  Normal  *Diabetes mellitus.  Sliding scale insulin ordered.  Diabetic diet. On insulin 70/30  *Cirrhosis.  Normal ammonia.  No lactulose. Currently stable.  Follows  with GI as outpatient.  * DVT prophylaxis with renally dosed Lovenox  *Recent  fracture and surgery on his ankles and foot Spoke to Ortho to guide about weightbearing status and need for orthopedic shoes. As per their advice, continue to wear the Ortho boot on left foot while having full weightbearing.  No need to wear boot on the right side.  All the records are reviewed and case discussed with Care Management/Social Worker Management plans discussed with the patient, family and they are in agreement.  CODE STATUS: Full  TOTAL TIME TAKING CARE OF THIS PATIENT: 35 minutes.   POSSIBLE D/C IN 1-2 DAYS, DEPENDING ON CLINICAL CONDITION.   Neita Carp M.D on 12/07/2018   Between 7am to 6pm - Pager - [phone]  After 6pm go to [email or web address] - password [email or web address] [facility]  Office  [phone]  CC: Primary care physician; Baxter Hire, MD  Note: This dictation was prepared with Dragon dictation along with smaller phrase technology. Any transcriptional errors that result from this process are unintentional.

## 2018-12-07 NOTE — Progress Notes (Signed)
Patient adamant about "not touching feet or removing boots"; encouraged patient that boots needed to be removed for skin and circulation assessment; acknowledged, still refused; able to move toes and legs; denies numbness, tingling or pain at this time; also refusing Ensure Max drink. Barbaraann Faster, RN 10:25 PM 12/07/2018

## 2018-12-07 NOTE — Progress Notes (Addendum)
MD made are that CBG meter is not linking to the pt.'s chart. RN has attempted multiple meters and changed his patient identification band but no success. MD made aware of CBG at 0800 it was 225, at 1150 it was 202, and at 1730 it was 226. Sliding scale coverage given per parameters.RN has requested IT to fix CBG meters. No new orders.   Corey West [employer]

## 2018-12-08 LAB — GLUCOSE, CAPILLARY
GLUCOSE-CAPILLARY: 202 mg/dL — AB (ref 70–99)
GLUCOSE-CAPILLARY: 226 mg/dL — AB (ref 70–99)
Glucose-Capillary: 172 mg/dL — ABNORMAL HIGH (ref 70–99)
Glucose-Capillary: 38 mg/dL — CL (ref 70–99)
Glucose-Capillary: 40 mg/dL — CL (ref 70–99)
Glucose-Capillary: 98 mg/dL (ref 70–99)
Glucose-Capillary: 137 mg/dL — ABNORMAL HIGH (ref 70–99)
Glucose-Capillary: 154 mg/dL — ABNORMAL HIGH (ref 70–99)
Glucose-Capillary: 110 mg/dL — ABNORMAL HIGH (ref 70–99)
Glucose-Capillary: 184 mg/dL — ABNORMAL HIGH (ref 70–99)
Glucose-Capillary: 222 mg/dL — ABNORMAL HIGH (ref 70–99)
Glucose-Capillary: 265 mg/dL — ABNORMAL HIGH (ref 70–99)
Glucose-Capillary: 205 mg/dL — ABNORMAL HIGH (ref 70–99)
Glucose-Capillary: 204 mg/dL — ABNORMAL HIGH (ref 70–99)
Glucose-Capillary: 213 mg/dL — ABNORMAL HIGH (ref 70–99)
Glucose-Capillary: 189 mg/dL — ABNORMAL HIGH (ref 70–99)
Glucose-Capillary: 173 mg/dL — ABNORMAL HIGH (ref 70–99)
Glucose-Capillary: 226 mg/dL — ABNORMAL HIGH (ref 70–99)

## 2018-12-08 MED ORDER — GLUCERNA SHAKE PO LIQD
237.0000 mL | Freq: Three times a day (TID) | ORAL | Status: DC
  Administered 2018-12-09: 237 mL via ORAL

## 2018-12-08 NOTE — Progress Notes (Signed)
Physical Therapy Treatment Patient Details Name: Corey West MRN: [id number] DOB: Dec 21, 1950 Today's Date: 12/08/2018    History of Present Illness 68 y.o. male with a known history of CKD stage IV, hypertension, diabetes mellitus, cirrhosis, recurrent admissions for hepatic encephalopathy presently in skilled nursing facility secondary to bilateral ankle fractures presents to the emergency room due to weakness, severe diarrhea and C. difficile test positive.    PT Comments    Pt presents with deficits in strength, transfers, mobility, gait, balance, and activity tolerance.  Pt required SBA with bed mobility tasks and min A with transfers.  Pt was able to amb 40' this session with a RW and CGA with slow, effortful cadence but was steady without LOB.  Pt with bilateral walking boots donned this session with no reported L ankle pain with ambulation and only minimal R ankle pain.  Pt continues to require significant effort and heavy BUE support with amb on level surfaces making discharge to hip prior living situation unsafe at this time.  Pt would need to manage ADL tasks without physical assistance as well as ascending and descending 3 steps with only one rail.  Pt will benefit from PT services in a SNF setting upon discharge to safely address above deficits for decreased caregiver assistance and eventual return to PLOF.     Follow Up Recommendations  SNF     Equipment Recommendations  None recommended by PT    Recommendations for Other Services       Precautions / Restrictions Precautions Precautions: Fall Restrictions Weight Bearing Restrictions: Yes RLE Weight Bearing: Weight bearing as tolerated LLE Weight Bearing: Weight bearing as tolerated Other Position/Activity Restrictions: must have L walking boot and RW for "full" WBing on L    Mobility  Bed Mobility Overal bed mobility: Needs Assistance;Modified Independent Bed Mobility: Supine to Sit;Sit to Supine     Supine to sit:  Supervision Sit to supine: Supervision   General bed mobility comments: Extra time and effort required with bed mobility tasks and use of rail but ultimately did not require physical assistance  Transfers Overall transfer level: Needs assistance Equipment used: Rolling walker (2 wheeled) Transfers: Sit to/from Stand Sit to Stand: Min assist;From elevated surface         General transfer comment: Min A from an elevated surface with transfers but steady upon initial stand  Ambulation/Gait Ambulation/Gait assistance: Min guard Gait Distance (Feet): 40 Feet Assistive device: Rolling walker (2 wheeled) Gait Pattern/deviations: Step-to pattern;Decreased step length - left;Decreased step length - right Gait velocity: decreased   General Gait Details: Pt reported min pain in the R ankle during amb but no pain in the L ankle.  Pt ambulated with slow, effortful cadence but was steady without LOB.    Stairs             Wheelchair Mobility    Modified Rankin (Stroke Patients Only)       Balance Overall balance assessment: Needs assistance Sitting-balance support: Bilateral upper extremity supported Sitting balance-Leahy Scale: Good     Standing balance support: Bilateral upper extremity supported Standing balance-Leahy Scale: Fair Standing balance comment: highly reliant on the walker but able to maintain upright standing                             Cognition Arousal/Alertness: Awake/alert Behavior During Therapy: WFL for tasks assessed/performed Overall Cognitive Status: Within Functional Limits for tasks assessed  Exercises Other Exercises Other Exercises: Pt stated desire to return home vs SNF.  Extensive pt education on safety strategies/concerns if pt were to discharge home. Other Exercises: Step-to gait training with demonstration and practice with cues for sequencing for decreased ankle pain as  needed. Other Exercises: Seated and supine hip and knee therex in available planes.      General Comments        Pertinent Vitals/Pain Pain Assessment: No/denies pain    Home Living                      Prior Function            PT Goals (current goals can now be found in the care plan section) Progress towards PT goals: Progressing toward goals    Frequency    Min 2X/week      PT Plan Current plan remains appropriate    Co-evaluation              AM-PAC PT "6 Clicks" Mobility   Outcome Measure  Help needed turning from your back to your side while in a flat bed without using bedrails?: A Little Help needed moving from lying on your back to sitting on the side of a flat bed without using bedrails?: A Little Help needed moving to and from a bed to a chair (including a wheelchair)?: A Little Help needed standing up from a chair using your arms (e.g., wheelchair or bedside chair)?: A Little Help needed to walk in hospital room?: A Little Help needed climbing 3-5 steps with a railing? : A Lot 6 Click Score: 17    End of Session Equipment Utilized During Treatment: Gait belt Activity Tolerance: Patient tolerated treatment well Patient left: in bed;with bed alarm set;with call bell/phone within reach;Other (comment)(Pt declined up in chair) Nurse Communication: Mobility status PT Visit Diagnosis: Muscle weakness (generalized) (M62.81);Difficulty in walking, not elsewhere classified (R26.2);Unsteadiness on feet (R26.81)     Time: [id number] PT Time Calculation (min) (ACUTE ONLY): 39 min  Charges:  $Gait Training: 8-22 mins $Therapeutic Exercise: 8-22 mins $Therapeutic Activity: 8-22 mins                     D. Scott Annsley Akkerman PT, DPT 12/08/18, 3:57 PM

## 2018-12-08 NOTE — Progress Notes (Signed)
[facility] [place] NAME: Corey West    MR#:  [id number]  DATE OF BIRTH:  04/28/1951  SUBJECTIVE:  CHIEF COMPLAINT:   Chief Complaint  Patient presents with  . Diarrhea   Continues to have diarrhea  REVIEW OF SYSTEMS:  CONSTITUTIONAL: No fever, have fatigue or weakness.  EYES: No blurred or double vision.  EARS, NOSE, AND THROAT: No tinnitus or ear pain.  RESPIRATORY: No cough, shortness of breath, wheezing or hemoptysis.  CARDIOVASCULAR: No chest pain, orthopnea, edema.  GASTROINTESTINAL: No nausea, vomiting, Positive  diarrhea  GENITOURINARY: No dysuria, hematuria.  ENDOCRINE: No polyuria, nocturia,  HEMATOLOGY: No anemia, easy bruising or bleeding SKIN: No rash or lesion. MUSCULOSKELETAL: No joint pain or arthritis.   NEUROLOGIC: No tingling, numbness, weakness.  PSYCHIATRY: No anxiety or depression.   ROS  DRUG ALLERGIES:  No Known Allergies  VITALS:  Blood pressure (!) 140/58, pulse 63, temperature 98.5 F (36.9 C), temperature source Oral, resp. rate 20, height 5\' 10"  (1.778 m), weight 127.1 kg, SpO2 100 %.  PHYSICAL EXAMINATION:  GENERAL:  68 y.o.-year-old patient lying in the bed with no acute distress.  Obese EYES: Pupils equal, round, reactive to light and accommodation. No scleral icterus. Extraocular muscles intact.  HEENT: Head atraumatic, normocephalic. Oropharynx and nasopharynx clear.  NECK:  Supple, no jugular venous distention. No thyroid enlargement, no tenderness.  LUNGS: Normal breath sounds bilaterally, no wheezing, rales,rhonchi or crepitation. No use of accessory muscles of respiration.  CARDIOVASCULAR: S1, S2 normal. No murmurs, rubs, or gallops.  ABDOMEN: Soft, nontender, nondistended. Bowel sounds present. No organomegaly or mass.  Rectal tube with liquidy stool present. EXTREMITIES: No pedal edema, cyanosis, or clubbing.  Cam boots in place NEUROLOGIC: Cranial nerves II through XII are intact. Muscle  strength 4/5 in all extremities. Sensation intact. Gait not checked.  PSYCHIATRIC: The patient is alert and oriented x 3.  SKIN: No obvious rash, lesion, or ulcer.   Physical Exam LABORATORY PANEL:   CBC Recent Labs  Lab 12/06/18 0611  WBC 12.1*  HGB 10.0*  HCT 29.1*  PLT 152   ------------------------------------------------------------------------------------------------------------------  Chemistries  Recent Labs  Lab 12/05/18 0549 12/06/18 0611  NA 130* 134*  K 3.5 4.3  CL 102 102  CO2 25 26  GLUCOSE 136* 135*  BUN 33* 32*  CREATININE 1.69* 1.69*  CALCIUM 7.5* 8.1*  MG 2.1  --   AST 53*  --   ALT 41  --   ALKPHOS 78  --   BILITOT 0.6  --    ------------------------------------------------------------------------------------------------------------------  Cardiac Enzymes No results for input(s): TROPONINI in the last 168 hours. ------------------------------------------------------------------------------------------------------------------  RADIOLOGY:  No results found.  ASSESSMENT AND PLAN:   Active Problems:   C. difficile diarrhea   Malnutrition of moderate degree  *C. difficile diarrhea Still has liquid stools but some improvement  oral vancomycin, probiotics.   Avoid laxatives or lactulose.  Under isolation D/C rectal tube  *CKD stage IV is stable.   *Hypokalemia secondary to severe diarrhea.  Replaced orally.  Normal  *Diabetes mellitus.  Sliding scale insulin ordered.  Diabetic diet. On insulin 70/30  *Cirrhosis.  Normal ammonia.  No lactulose. Currently stable.  Follows with GI as outpatient.  * DVT prophylaxis with renally dosed Lovenox  *Recent fracture and surgery on his ankles and foot Spoke to Ortho to guide about weightbearing status and need for orthopedic shoes. As per their advice, continue to wear the Ortho boot on  left foot while having full weightbearing.  No need to wear boot on the right side.  PT consult and  d/c home with home health when diarrhea improved  All the records are reviewed and case discussed with Care Management/Social Worker Management plans discussed with the patient, family and they are in agreement.  CODE STATUS: Full  TOTAL TIME TAKING CARE OF THIS PATIENT: 35 minutes.   POSSIBLE D/C IN 1-2 DAYS, DEPENDING ON CLINICAL CONDITION.   Neita Carp M.D on 12/08/2018   Between 7am to 6pm - Pager - [phone]  After 6pm go to [email or web address] - password [email or web address] [facility]  Office  [phone]  CC: Primary care physician; Baxter Hire, MD  Note: This dictation was prepared with Dragon dictation along with smaller phrase technology. Any transcriptional errors that result from this process are unintentional.

## 2018-12-08 NOTE — Progress Notes (Signed)
Nutrition Follow Up Note   DOCUMENTATION CODES:   Non-severe (moderate) malnutrition in context of chronic illness, Obesity unspecified  INTERVENTION:   Glucerna Shake po TID, each supplement provides 220 kcal and 10 grams of protein  - Magic cup daily with lunch, supplement provides 290 kcal and 9 grams of protein  NUTRITION DIAGNOSIS:   Moderate Malnutrition related to chronic illness (cirrhosis, CKD stage IV) as evidenced by mild fat depletion, moderate fat depletion, mild muscle depletion, percent weight loss (7% weight loss in 2 months).  GOAL:   Patient will meet greater than or equal to 90% of their needs  -progressing   MONITOR:   PO intake, Supplement acceptance, I & O's, Labs, Weight trends  ASSESSMENT:   68 year old male who presented to the ED on 3/3 from [facility] with diarrhea for several days. PMH of anemia, Barrett's esophagus, CKD stage IV, cirrhosis, T2DM, esophageal varices, GERD, HLD, HTN. Pt is C. difficile positive.   Pt with improving appetite and oral intake; pt eating 50-100% of meals. Pt is refusing the Ensure Max protein; RD will add Glucerna instead. Pt continues to have diarrhea. Per chart, pt with ~6lb weight gain since admit.   Medications reviewed and include: lovenox, ferrous sulfate, lasix, insulin, protonix, florastor, vancomycin, zinc sulfate   Labs reviewed: cbgs- 173, 226 x 24 hrs  Diet Order:   Diet Order            Diet Carb Modified Fluid consistency: Thin; Room service appropriate? Yes  Diet effective now             EDUCATION NEEDS:   No education needs have been identified at this time  Skin:  Skin Assessment: Reviewed RN Assessment  Last BM:  3/9- type 6  Height:   Ht Readings from Last 1 Encounters:  12/04/18 5\' 10"  (1.778 m)    Weight:   Wt Readings from Last 1 Encounters:  12/08/18 127.1 kg    Ideal Body Weight:  75.5 kg  BMI:  Body mass index is 40.21 kg/m.  Estimated Nutritional Needs:    Kcal:  2100-2300  Protein:  110-125 grams  Fluid:  >/= 2.1 L  Koleen Distance MS, RD, LDN Pager #- [phone] Office#- [phone] After Hours Pager: [phone]

## 2018-12-09 LAB — CBC WITH DIFFERENTIAL/PLATELET
Abs Immature Granulocytes: 0.14 10*3/uL — ABNORMAL HIGH (ref 0.00–0.07)
Basophils Absolute: 0 10*3/uL (ref 0.0–0.1)
Basophils Relative: 0 %
Eosinophils Absolute: 0 10*3/uL (ref 0.0–0.5)
Eosinophils Relative: 0 %
HCT: 30.7 % — ABNORMAL LOW (ref 39.0–52.0)
Hemoglobin: 10.1 g/dL — ABNORMAL LOW (ref 13.0–17.0)
Immature Granulocytes: 1 %
Lymphocytes Relative: 10 %
Lymphs Abs: 1.1 10*3/uL (ref 0.7–4.0)
MCH: 29.2 pg (ref 26.0–34.0)
MCHC: 32.9 g/dL (ref 30.0–36.0)
MCV: 88.7 fL (ref 80.0–100.0)
Monocytes Absolute: 0.5 10*3/uL (ref 0.1–1.0)
Monocytes Relative: 4 %
Neutro Abs: 9.3 10*3/uL — ABNORMAL HIGH (ref 1.7–7.7)
Neutrophils Relative %: 85 %
Platelets: 154 10*3/uL (ref 150–400)
RBC: 3.46 MIL/uL — ABNORMAL LOW (ref 4.22–5.81)
RDW: 14.6 % (ref 11.5–15.5)
WBC: 11 10*3/uL — ABNORMAL HIGH (ref 4.0–10.5)
nRBC: 0 % (ref 0.0–0.2)

## 2018-12-09 LAB — COMPREHENSIVE METABOLIC PANEL
ALT: 39 U/L (ref 0–44)
AST: 39 U/L (ref 15–41)
Albumin: 2.5 g/dL — ABNORMAL LOW (ref 3.5–5.0)
Alkaline Phosphatase: 68 U/L (ref 38–126)
Anion gap: 7 (ref 5–15)
BUN: 25 mg/dL — ABNORMAL HIGH (ref 8–23)
CO2: 26 mmol/L (ref 22–32)
Calcium: 8.7 mg/dL — ABNORMAL LOW (ref 8.9–10.3)
Chloride: 103 mmol/L (ref 98–111)
Creatinine, Ser: 1.59 mg/dL — ABNORMAL HIGH (ref 0.61–1.24)
GFR calc Af Amer: 52 mL/min — ABNORMAL LOW (ref >60)
GFR, EST NON AFRICAN AMERICAN: 45 mL/min — AB (ref >60)
Glucose, Bld: 90 mg/dL (ref 70–99)
Potassium: 4.4 mmol/L (ref 3.5–5.1)
Sodium: 136 mmol/L (ref 135–145)
Total Bilirubin: 0.7 mg/dL (ref 0.3–1.2)
Total Protein: 6 g/dL — ABNORMAL LOW (ref 6.5–8.1)

## 2018-12-09 LAB — GLUCOSE, CAPILLARY
GLUCOSE-CAPILLARY: 80 mg/dL (ref 70–99)
Glucose-Capillary: 40 mg/dL — CL (ref 70–99)
Glucose-Capillary: 179 mg/dL — ABNORMAL HIGH (ref 70–99)
Glucose-Capillary: 126 mg/dL — ABNORMAL HIGH (ref 70–99)

## 2018-12-09 MED ORDER — HYDROCODONE-ACETAMINOPHEN 5-325 MG PO TABS: 1.0000 | ORAL_TABLET | Freq: Four times a day (QID) | ORAL | 0 refills | Status: DC | PRN

## 2018-12-09 MED ORDER — HYDROCODONE-ACETAMINOPHEN 5-325 MG PO TABS
1.0000 | ORAL_TABLET | Freq: Once | ORAL | Status: AC
  Administered 2018-12-09: 2 via ORAL

## 2018-12-09 MED ORDER — HYDROCODONE-ACETAMINOPHEN 10-325 MG PO TABS: 1.0000 | ORAL_TABLET | Freq: Once | ORAL | Status: DC

## 2018-12-09 MED ORDER — ALUM & MAG HYDROXIDE-SIMETH 200-200-20 MG/5ML PO SUSP
15.0000 mL | Freq: Four times a day (QID) | ORAL | Status: DC | PRN
  Administered 2018-12-09: 15 mL via ORAL

## 2018-12-09 MED ORDER — VANCOMYCIN HCL 125 MG PO CAPS: 125.0000 mg | ORAL_CAPSULE | Freq: Four times a day (QID) | ORAL | 0 refills | Status: DC

## 2018-12-09 NOTE — Care Management Note (Signed)
Case Management Note  Patient Details  Name: Corey West MRN: [id number] Date of Birth: 03/29/1951   Patient to discharge home today.  Nephew to transport at discharge.  [name] with [facility] notified of discharge. Patient provided pamphlet on Med Alert per his request.   Patient to discharge on oral vanc.  Patient would prefer to obtain medication from [facility].  Per pharmacist at pharmacy they would not be able to have the oral vanc available until tomorrow at 12 pm.  Out of pocket cost $50.28  Per patient requested that script be called in to [facility] on [address].  Rx called in.  RNCM confirmed they have the medication in stock.  Patient notified.  Patient confirms he will be able to pay for medication at discharge.   Subjective/Objective:                    Action/Plan:   Expected Discharge Date:  12/09/18               Expected Discharge Plan:  [place]  In-House Referral:     Discharge planning Services  CM Consult  Post Acute Care Choice:  Home Health Choice offered to:  Patient  DME Arranged:    DME Agency:     HH Arranged:  RN, PT, Nurse's Aide, OT [place] Agency:  [facility]  Status of Service:  Completed, signed off  If discussed at [facility] of Stay Meetings, dates discussed:    Additional Comments:  Beverly Sessions, RN 12/09/2018, 5:23 PM

## 2018-12-09 NOTE — Discharge Instructions (Signed)
Resume diet and activity as before ° ° °

## 2018-12-09 NOTE — Progress Notes (Signed)
Corey West to be D/C'd home per MD order.  Discussed prescriptions and follow up appointments with the patient. Prescriptions given to patient, medication list explained in detail. Pt verbalized understanding.  Allergies as of 12/09/2018   No Known Allergies     Medication List    STOP taking these medications   docusate sodium 100 MG capsule Commonly known as:  COLACE   heparin 5000 UNIT/ML injection   lactulose 10 GM/15ML solution Commonly known as:  CHRONULAC   ranitidine 150 MG capsule Commonly known as:  ZANTAC     TAKE these medications   Delsym 30 MG/5ML liquid Generic drug:  dextromethorphan Take 30 mg by mouth 2 (two) times daily.   feeding supplement (PRO-STAT SUGAR FREE 64) Liqd Take 30 mLs by mouth 2 (two) times daily with a meal.   ferrous sulfate 325 (65 FE) MG tablet Take 325 mg by mouth 2 (two) times daily with a meal.   furosemide 40 MG tablet Commonly known as:  LASIX Take 1 tablet (40 mg total) by mouth daily.   HYDROcodone-acetaminophen 5-325 MG tablet Commonly known as:  NORCO/VICODIN Take 1 tablet by mouth every 6 (six) hours as needed for severe pain. What changed:    how much to take  reasons to take this   insulin aspart protamine - aspart (70-30) 100 UNIT/ML FlexPen Commonly known as:  NOVOLOG 70/30 MIX Inject 85 Units into the skin daily with breakfast.   insulin aspart protamine - aspart (70-30) 100 UNIT/ML FlexPen Commonly known as:  NOVOLOG 70/30 MIX Inject 72 Units into the skin at bedtime.   loperamide 2 MG capsule Commonly known as:  IMODIUM Take 2 mg by mouth 3 (three) times daily.   LORazepam 0.5 MG tablet Commonly known as:  ATIVAN Take 1 tablet (0.5 mg total) by mouth every 8 (eight) hours as needed for anxiety. Notes to patient:  Last dose given today at 8:30am   metolazone 2.5 MG tablet Commonly known as:  ZAROXOLYN Take 1 tablet (2.5 mg total) by mouth daily.   multivitamin tablet Take 1 tablet by mouth  daily.   omeprazole 40 MG capsule Commonly known as:  PRILOSEC Take 40 mg by mouth daily.   Potassium Chloride ER 20 MEQ Tbcr Take 20 mEq by mouth 2 (two) times daily. While on lasix'   rifaximin 550 MG Tabs tablet Commonly known as:  Xifaxan Take 1 tablet (550 mg total) by mouth 2 (two) times daily.   vancomycin 125 MG capsule Commonly known as:  VANCOCIN Take 1 capsule (125 mg total) by mouth 4 (four) times daily.   vitamin C 250 MG tablet Commonly known as:  ASCORBIC ACID Take 500 mg by mouth 2 (two) times daily.   zinc sulfate 220 (50 Zn) MG capsule Take 220 mg by mouth daily.       Vitals:   12/08/18 2052 12/09/18 0451  BP: (!) 134/51 (!) 125/54  Pulse: 75 71  Resp: 20 20  Temp: 97.9 F (36.6 C) 98.2 F (36.8 C)  SpO2: 99% 100%    Skin clean, dry and intact without evidence of skin break down, no evidence of skin tears noted. IV catheter discontinued intact. Site without signs and symptoms of complications. Dressing and pressure applied. Pt denies pain at this time. No complaints noted.  An After Visit Summary was printed and given to the patient. Patient escorted via [place], and D/C home via private auto.  Corey West

## 2018-12-16 ENCOUNTER — Other Ambulatory Visit: Payer: Self-pay | Admitting: *Deleted

## 2018-12-16 DIAGNOSIS — D500 Iron deficiency anemia secondary to blood loss (chronic): Secondary | ICD-10-CM

## 2018-12-17 NOTE — Discharge Summary (Signed)
[facility] [place] NAME: Corey West    MR#:  [id number]  DATE OF BIRTH:  [birth year]  DATE OF ADMISSION:  12/02/2018 ADMITTING PHYSICIAN: Hillary Bow, MD  DATE OF DISCHARGE: 12/09/2018  2:26 PM  PRIMARY CARE PHYSICIAN: Baxter Hire, MD   ADMISSION DIAGNOSIS:  Dehydration [E86.0] Colitis due to Clostridium difficile [A04.72]  DISCHARGE DIAGNOSIS:  Active Problems:   C. difficile diarrhea   Malnutrition of moderate degree   SECONDARY DIAGNOSIS:   Past Medical History:  Diagnosis Date  . Anemia   . Anxiety    controlled;   . Arthritis   . AVM (arteriovenous malformation) of stomach, acquired with hemorrhage   . Barrett's esophagus   . Chronic kidney disease    renal infufficiency  . Cirrhosis ([place])   . Depression    controlled;   Marland Kitchen Diabetes mellitus without complication ([place])    not controlled, taking insulin but sugar continues to run high;   . Edema   . Esophageal varices ([place])   . GAVE (gastric antral vascular ectasia)   . GERD (gastroesophageal reflux disease)   . History of hiatal hernia   . Hyperlipidemia   . Hypertension    controlled well;   . 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 Obesity   . Pancytopenia ([place])   . Polyp, stomach    with chronic blood loss  . Sleep apnea    does not wear a cpap, Medicare would not pay for it   . Venous stasis dermatitis of both lower extremities   . Venous stasis of both lower extremities    with cellulitis     ADMITTING HISTORY  HISTORY OF PRESENT ILLNESS:  Corey West  is a 68 y.o. male with a known history of CKD stage IV, hypertension, diabetes mellitus, cirrhosis, recurrent admissions for hepatic encephalopathy presently in skilled nursing facility secondary to bimalleolar fracture presents to the emergency room due to weakness, severe diarrhea and C. difficile test positive at the nursing home yesterday.  Patient was diagnosed with C. difficile on 11/08/2018 and treated with  oral vancomycin at the nursing home.  Unclear how long this was treated for.  His lactulose was stopped.  Presently not on laxatives.  His diarrhea has worsened and presently up to 10-12 times a day, watery with abdominal pain, nausea.  Patient was sent to emergency room and is being admitted.  Has leukocytosis.  HOSPITAL COURSE:    *C. difficile diarrhea Started on oral vancomycin.  Patient had significant diarrhea for multiple days after admission.  By the day of discharge he has less number of stools.  Afebrile.  Normal WBC.  No abdominal pain or tenderness. Patient discharged home with oral vancomycin to finish 2-week course.  *CKD stage IV is stable.   *Hypokalemia secondary to severe diarrhea. Replaced orally.  Improved  *Diabetes mellitus. Sliding scale insulin ordered. Diabetic diet. On insulin 70/30  *Cirrhosis. Normal ammonia. No lactulose. Currently stable.  Follows with GI as outpatient. His lactulose was stopped.  Rifaximin continued.  I have advised him to restart lactulose twice a day once his diarrhea resolves completely.  * DVT prophylaxis with renally dosed Lovenox in the hospital  *Recent fracture and surgery on his ankles and foot Spoke to Ortho to guide about weightbearing status and need for orthopedic shoes. As per their advice, continue to wear the Ortho boot on left foot while having full weightbearing.  No need to wear boot on the  right side.  Patient was admitted from skilled nursing facility.  He has improved well with his weakness from the fractures.  Worked with physical therapy.  He wished to go home.  Discharged home with home health services.  CONSULTS OBTAINED:  Treatment Team:  Dereck Leep, MD  DRUG ALLERGIES:  No Known Allergies  DISCHARGE MEDICATIONS:   Allergies as of 12/09/2018   No Known Allergies     Medication List    STOP taking these medications   docusate sodium 100 MG capsule Commonly known as:  COLACE    heparin 5000 UNIT/ML injection   lactulose 10 GM/15ML solution Commonly known as:  CHRONULAC   ranitidine 150 MG capsule Commonly known as:  ZANTAC     TAKE these medications   Delsym 30 MG/5ML liquid Generic drug:  dextromethorphan Take 30 mg by mouth 2 (two) times daily.   feeding supplement (PRO-STAT SUGAR FREE 64) Liqd Take 30 mLs by mouth 2 (two) times daily with a meal.   ferrous sulfate 325 (65 FE) MG tablet Take 325 mg by mouth 2 (two) times daily with a meal.   furosemide 40 MG tablet Commonly known as:  LASIX Take 1 tablet (40 mg total) by mouth daily.   HYDROcodone-acetaminophen 5-325 MG tablet Commonly known as:  NORCO/VICODIN Take 1 tablet by mouth every 6 (six) hours as needed for severe pain. What changed:    how much to take  reasons to take this   insulin aspart protamine - aspart (70-30) 100 UNIT/ML FlexPen Commonly known as:  NOVOLOG 70/30 MIX Inject 85 Units into the skin daily with breakfast.   insulin aspart protamine - aspart (70-30) 100 UNIT/ML FlexPen Commonly known as:  NOVOLOG 70/30 MIX Inject 72 Units into the skin at bedtime.   loperamide 2 MG capsule Commonly known as:  IMODIUM Take 2 mg by mouth 3 (three) times daily.   LORazepam 0.5 MG tablet Commonly known as:  ATIVAN Take 1 tablet (0.5 mg total) by mouth every 8 (eight) hours as needed for anxiety. Notes to patient:  Last dose given today at 8:30am   metolazone 2.5 MG tablet Commonly known as:  ZAROXOLYN Take 1 tablet (2.5 mg total) by mouth daily.   multivitamin tablet Take 1 tablet by mouth daily.   omeprazole 40 MG capsule Commonly known as:  PRILOSEC Take 40 mg by mouth daily.   Potassium Chloride ER 20 MEQ Tbcr Take 20 mEq by mouth 2 (two) times daily. While on lasix'   rifaximin 550 MG Tabs tablet Commonly known as:  Xifaxan Take 1 tablet (550 mg total) by mouth 2 (two) times daily.   vancomycin 125 MG capsule Commonly known as:  VANCOCIN Take 1 capsule  (125 mg total) by mouth 4 (four) times daily.   vitamin C 250 MG tablet Commonly known as:  ASCORBIC ACID Take 500 mg by mouth 2 (two) times daily.   zinc sulfate 220 (50 Zn) MG capsule Take 220 mg by mouth daily.       Today   VITAL SIGNS:  Blood pressure (!) 125/54, pulse 71, temperature 98.2 F (36.8 C), temperature source Oral, resp. rate 20, height 5\' 10"  (1.778 m), weight 127.2 kg, SpO2 100 %.  I/O:  No intake or output data in the 24 hours ending 12/17/18 1808  PHYSICAL EXAMINATION:  Physical Exam  GENERAL:  68 y.o.-year-old patient lying in the bed with no acute distress.  LUNGS: Normal breath sounds bilaterally, no wheezing, rales,rhonchi or crepitation.  No use of accessory muscles of respiration.  CARDIOVASCULAR: S1, S2 normal. No murmurs, rubs, or gallops.  ABDOMEN: Soft, non-tender, non-distended. Bowel sounds present. No organomegaly or mass.  NEUROLOGIC: Moves all 4 extremities. PSYCHIATRIC: The patient is alert and oriented x 3.  SKIN: No obvious rash, lesion, or ulcer.   DATA REVIEW:   CBC No results for input(s): WBC, HGB, HCT, PLT in the last 168 hours.  Chemistries  No results for input(s): NA, K, CL, CO2, GLUCOSE, BUN, CREATININE, CALCIUM, MG, AST, ALT, ALKPHOS, BILITOT in the last 168 hours.  Invalid input(s): GFRCGP  Cardiac Enzymes No results for input(s): TROPONINI in the last 168 hours.  Microbiology Results  Results for orders placed or performed during the hospital encounter of 12/02/18  MRSA PCR Screening     Status: None   Collection Time: 12/03/18  6:56 AM  Result Value Ref Range Status   MRSA by PCR NEGATIVE NEGATIVE Final    Comment:        The GeneXpert MRSA Assay (FDA approved for NASAL specimens only), is one component of a comprehensive MRSA colonization surveillance program. It is not intended to diagnose MRSA infection nor to guide or monitor treatment for MRSA infections. Performed at [facility], [address] [place], [place] [address]     RADIOLOGY:  No results found.  Follow up with PCP in 1 week.  Management plans discussed with the patient, family and they are in agreement.  CODE STATUS:  Code Status History    Date Active Date Inactive Code Status Order ID Comments User Context   12/02/2018 1713 12/09/2018 1737 Full Code [id number]  Hillary Bow, MD ED   10/16/2018 0054 10/18/2018 1842 Full Code [id number]  [name], MD Inpatient   07/18/2018 2339 07/21/2018 1356 Full Code [id number]  Lance Coon, MD Inpatient   06/18/2018 1609 06/19/2018 1717 Full Code [id number]  Epifanio Lesches, MD ED   12/31/2017 1856 01/01/2018 1205 Full Code [id number]  Saundra Shelling, MD Inpatient   12/25/2017 1456 12/26/2017 1618 Full Code [id number]  Epifanio Lesches, MD ED   12/16/2017 0921 12/18/2017 1635 DNR [id number]  Max Sane, MD Inpatient   12/16/2017 0305 12/16/2017 0921 Full Code [id number]  Salary, Avel Peace, MD ED   12/02/2017 2003 12/05/2017 2000 Full Code [id number]  Saundra Shelling, MD Inpatient   11/21/2017 1948 11/23/2017 1958 Full Code [id number]  Loletha Grayer, MD ED   10/23/2017 1405 10/26/2017 1402 Full Code [id number]  Bettey Costa, MD Inpatient   10/18/2017 0058 10/19/2017 1806 Full Code [id number]  Saundra Shelling, MD Inpatient   10/09/2017 2139 10/11/2017 1806 Full Code [id number]  [name], MD Inpatient   09/03/2017 2124 09/05/2017 1552 Full Code [id number]  Nicholes Mango, MD Inpatient   06/26/2017 1119 06/28/2017 1748 Full Code [id number]  Henreitta Leber, MD Inpatient   02/21/2017 2007 02/22/2017 1803 Full Code [id number]  [name], MD Inpatient   07/20/2015 1245 07/22/2015 2217 Full Code [id number]  Nicholes Mango, MD Inpatient   02/08/2015 2331 02/11/2015 1943 Full Code [id number]  Hower, Aaron Mose, MD ED      TOTAL TIME TAKING CARE OF THIS PATIENT ON DAY OF DISCHARGE: more than 30 minutes.   Neita Carp M.D on 12/17/2018 at 6:08 PM  Between 7am to 6pm - Pager  - [phone]  After 6pm go to [email or web address] - password [email or web address]  Office  [phone]  CC: Primary care physician; Baxter Hire, MD  Note: This dictation  was prepared with Dragon dictation along with smaller phrase technology. Any transcriptional errors that result from this process are unintentional. ]\

## 2018-12-22 ENCOUNTER — Inpatient Hospital Stay: Payer: Medicare Other | Admitting: Oncology

## 2018-12-22 ENCOUNTER — Inpatient Hospital Stay: Payer: Medicare Other

## 2018-12-22 ENCOUNTER — Ambulatory Visit: Payer: Medicare Other | Admitting: Oncology

## 2018-12-22 ENCOUNTER — Ambulatory Visit: Payer: Medicare Other

## 2018-12-22 ENCOUNTER — Other Ambulatory Visit: Payer: Medicare Other

## 2019-01-14 ENCOUNTER — Other Ambulatory Visit: Payer: Self-pay

## 2019-01-14 ENCOUNTER — Emergency Department: Payer: Medicare Other

## 2019-01-14 ENCOUNTER — Observation Stay
Admission: EM | Admit: 2019-01-14 | Discharge: 2019-01-15 | Disposition: A | Discharge status: Home / Self Care | Payer: Medicare Other | Attending: Internal Medicine | Admitting: Internal Medicine

## 2019-01-14 DIAGNOSIS — E876 Hypokalemia: Secondary | ICD-10-CM | POA: Insufficient documentation

## 2019-01-14 DIAGNOSIS — E785 Hyperlipidemia, unspecified: Secondary | ICD-10-CM | POA: Diagnosis present

## 2019-01-14 DIAGNOSIS — M1990 Unspecified osteoarthritis, unspecified site: Secondary | ICD-10-CM | POA: Insufficient documentation

## 2019-01-14 DIAGNOSIS — I129 Hypertensive chronic kidney disease with stage 1 through stage 4 chronic kidney disease, or unspecified chronic kidney disease: Secondary | ICD-10-CM | POA: Insufficient documentation

## 2019-01-14 DIAGNOSIS — K219 Gastro-esophageal reflux disease without esophagitis: Secondary | ICD-10-CM | POA: Diagnosis not present

## 2019-01-14 DIAGNOSIS — Z794 Long term (current) use of insulin: Secondary | ICD-10-CM | POA: Insufficient documentation

## 2019-01-14 DIAGNOSIS — F17290 Nicotine dependence, other tobacco product, uncomplicated: Secondary | ICD-10-CM | POA: Diagnosis not present

## 2019-01-14 DIAGNOSIS — N189 Chronic kidney disease, unspecified: Secondary | ICD-10-CM

## 2019-01-14 DIAGNOSIS — E860 Dehydration: Secondary | ICD-10-CM | POA: Insufficient documentation

## 2019-01-14 DIAGNOSIS — R4182 Altered mental status, unspecified: Secondary | ICD-10-CM | POA: Insufficient documentation

## 2019-01-14 DIAGNOSIS — I6782 Cerebral ischemia: Secondary | ICD-10-CM | POA: Insufficient documentation

## 2019-01-14 DIAGNOSIS — D638 Anemia in other chronic diseases classified elsewhere: Secondary | ICD-10-CM | POA: Diagnosis not present

## 2019-01-14 DIAGNOSIS — N179 Acute kidney failure, unspecified: Secondary | ICD-10-CM | POA: Diagnosis not present

## 2019-01-14 DIAGNOSIS — F419 Anxiety disorder, unspecified: Secondary | ICD-10-CM | POA: Insufficient documentation

## 2019-01-14 DIAGNOSIS — Z8249 Family history of ischemic heart disease and other diseases of the circulatory system: Secondary | ICD-10-CM | POA: Diagnosis not present

## 2019-01-14 DIAGNOSIS — Z79899 Other long term (current) drug therapy: Secondary | ICD-10-CM | POA: Diagnosis not present

## 2019-01-14 DIAGNOSIS — K7460 Unspecified cirrhosis of liver: Secondary | ICD-10-CM | POA: Diagnosis present

## 2019-01-14 DIAGNOSIS — E669 Obesity, unspecified: Secondary | ICD-10-CM | POA: Insufficient documentation

## 2019-01-14 DIAGNOSIS — R479 Unspecified speech disturbances: Secondary | ICD-10-CM | POA: Diagnosis not present

## 2019-01-14 DIAGNOSIS — E1122 Type 2 diabetes mellitus with diabetic chronic kidney disease: Secondary | ICD-10-CM | POA: Diagnosis not present

## 2019-01-14 DIAGNOSIS — D696 Thrombocytopenia, unspecified: Secondary | ICD-10-CM | POA: Diagnosis not present

## 2019-01-14 DIAGNOSIS — K7030 Alcoholic cirrhosis of liver without ascites: Secondary | ICD-10-CM | POA: Diagnosis not present

## 2019-01-14 DIAGNOSIS — Z6841 Body Mass Index (BMI) 40.0 and over, adult: Secondary | ICD-10-CM | POA: Insufficient documentation

## 2019-01-14 DIAGNOSIS — G4730 Sleep apnea, unspecified: Secondary | ICD-10-CM | POA: Insufficient documentation

## 2019-01-14 DIAGNOSIS — N183 Chronic kidney disease, stage 3 (moderate): Secondary | ICD-10-CM | POA: Diagnosis not present

## 2019-01-14 DIAGNOSIS — F329 Major depressive disorder, single episode, unspecified: Secondary | ICD-10-CM | POA: Diagnosis not present

## 2019-01-14 DIAGNOSIS — R4701 Aphasia: Secondary | ICD-10-CM | POA: Diagnosis present

## 2019-01-14 LAB — CBC WITH DIFFERENTIAL/PLATELET
Abs Immature Granulocytes: 0.03 10*3/uL (ref 0.00–0.07)
Basophils Absolute: 0 10*3/uL (ref 0.0–0.1)
Basophils Relative: 0 %
Eosinophils Absolute: 0 10*3/uL (ref 0.0–0.5)
Eosinophils Relative: 0 %
HCT: 27.3 % — ABNORMAL LOW (ref 39.0–52.0)
Hemoglobin: 8.8 g/dL — ABNORMAL LOW (ref 13.0–17.0)
Immature Granulocytes: 0 %
Lymphocytes Relative: 7 %
Lymphs Abs: 0.5 10*3/uL — ABNORMAL LOW (ref 0.7–4.0)
MCH: 29.4 pg (ref 26.0–34.0)
MCHC: 32.2 g/dL (ref 30.0–36.0)
MCV: 91.3 fL (ref 80.0–100.0)
Monocytes Absolute: 0.3 10*3/uL (ref 0.1–1.0)
Monocytes Relative: 5 %
Neutro Abs: 5.9 10*3/uL (ref 1.7–7.7)
Neutrophils Relative %: 88 %
Platelets: 111 10*3/uL — ABNORMAL LOW (ref 150–400)
RBC: 2.99 MIL/uL — ABNORMAL LOW (ref 4.22–5.81)
RDW: 14.9 % (ref 11.5–15.5)
WBC: 6.8 10*3/uL (ref 4.0–10.5)
nRBC: 0 % (ref 0.0–0.2)

## 2019-01-14 LAB — AMMONIA: Ammonia: 31 umol/L (ref 9–35)

## 2019-01-14 LAB — BASIC METABOLIC PANEL
Anion gap: 14 (ref 5–15)
BUN: 44 mg/dL — ABNORMAL HIGH (ref 8–23)
CO2: 25 mmol/L (ref 22–32)
Calcium: 9 mg/dL (ref 8.9–10.3)
Chloride: 99 mmol/L (ref 98–111)
Creatinine, Ser: 2.08 mg/dL — ABNORMAL HIGH (ref 0.61–1.24)
GFR calc Af Amer: 37 mL/min — ABNORMAL LOW (ref >60)
GFR calc non Af Amer: 32 mL/min — ABNORMAL LOW (ref >60)
Glucose, Bld: 201 mg/dL — ABNORMAL HIGH (ref 70–99)
Potassium: 3.9 mmol/L (ref 3.5–5.1)
Sodium: 138 mmol/L (ref 135–145)

## 2019-01-14 LAB — GLUCOSE, CAPILLARY: Glucose-Capillary: 185 mg/dL — ABNORMAL HIGH (ref 70–99)

## 2019-01-14 NOTE — ED Notes (Signed)
Patient transported to CT 

## 2019-01-14 NOTE — ED Provider Notes (Addendum)
[facility] Emergency Department Provider Note  ____________________________________________   I have reviewed the triage vital signs and the nursing notes. Where available I have reviewed prior notes and, if possible and indicated, outside hospital notes.    HISTORY  Chief Complaint Altered Mental Status    HPI Corey West is a 67 y.o. male  With a history of cirrhosis who used to be taking lactulose but no longer is because he was taking antibiotics for his C. difficile.  His C. difficile seems to be improving.  But he felt weak and jittery and seem to be having trouble talking which is a sign for him that his ammonia levels are going up.  He is been off his ammonia for some time.  He states that this time he speaking normally, he states that he has had no fall or closed head injury.  He denies cough shortness of breath or diarrhea.  He states that this is how he feels with and his ammonia level is up.  Has no focal numbness or weakness he denies any other complaints   Past Medical History:  Diagnosis Date  . Anemia   . Anxiety    controlled;   . Arthritis   . AVM (arteriovenous malformation) of stomach, acquired with hemorrhage   . Barrett's esophagus   . Chronic kidney disease    renal infufficiency  . Cirrhosis ([place])   . Depression    controlled;   Marland Kitchen Diabetes mellitus without complication ([place])    not controlled, taking insulin but sugar continues to run high;   . Edema   . Esophageal varices ([place])   . GAVE (gastric antral vascular ectasia)   . GERD (gastroesophageal reflux disease)   . History of hiatal hernia   . Hyperlipidemia   . Hypertension    controlled well;   . 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 Obesity   . Pancytopenia ([place])   . Polyp, stomach    with chronic blood loss  . Sleep apnea    does not wear a cpap, Medicare would not pay for it   . Venous stasis dermatitis of both lower extremities   . Venous stasis of both lower  extremities    with cellulitis    Patient Active Problem List   Diagnosis Date Noted  . Malnutrition of moderate degree 12/03/2018  . C. difficile diarrhea 12/02/2018  . Ankle fracture, left, closed, initial encounter 10/15/2018  . Ankle fracture, right, closed, initial encounter 10/15/2018  . Ankle fracture 10/15/2018  . Pressure injury of skin 07/19/2018  . UTI (urinary tract infection) 07/18/2018  . Goals of care, counseling/discussion   . Palliative care encounter   . Hepatic encephalopathy syndrome ([place]) 12/16/2017  . Hepatic encephalopathy ([place]) 10/18/2017  . Hypokalemia 10/09/2017  . Hyperglycemia 10/09/2017  . Symptomatic anemia 06/26/2017  . Sepsis ([place]) 06/12/2017  . Cellulitis in diabetic foot ([place]) 02/21/2017  . Uncontrolled diabetes mellitus ([place]) 02/21/2017  . Cellulitis 02/21/2017  . GI bleed 07/20/2015  . Hematemesis 02/08/2015  . Cirrhosis ([place]) 02/08/2015  . Esophageal varices ([place]) 02/08/2015  . Type 2 diabetes mellitus ([place]) 02/08/2015  . Gastroesophageal reflux disease 02/08/2015  . Hyperlipidemia 02/08/2015  . Iron deficiency anemia 12/04/2013  . Thrombocytopenia ([place]) 12/04/2013  . Splenomegaly 11/04/2013    Past Surgical History:  Procedure Laterality Date  . ESOPHAGOGASTRODUODENOSCOPY N/A 02/09/2015   Procedure: ESOPHAGOGASTRODUODENOSCOPY (EGD);  Surgeon: Manya Silvas, MD;  Location: [facility] ENDOSCOPY;  Service: Endoscopy;  Laterality: N/A;  .  ESOPHAGOGASTRODUODENOSCOPY N/A 07/22/2015   Procedure: ESOPHAGOGASTRODUODENOSCOPY (EGD);  Surgeon: Manya Silvas, MD;  Location: [facility] ENDOSCOPY;  Service: Endoscopy;  Laterality: N/A;  . ESOPHAGOGASTRODUODENOSCOPY N/A 06/26/2017   Procedure: ESOPHAGOGASTRODUODENOSCOPY (EGD);  Surgeon: Manya Silvas, MD;  Location: [facility] ENDOSCOPY;  Service: Endoscopy;  Laterality: N/A;  . ESOPHAGOGASTRODUODENOSCOPY N/A 06/27/2017   Procedure: ESOPHAGOGASTRODUODENOSCOPY (EGD);  Surgeon: Manya Silvas, MD;  Location:  [facility] ENDOSCOPY;  Service: Endoscopy;  Laterality: N/A;  . ESOPHAGOGASTRODUODENOSCOPY N/A 06/28/2017   Procedure: ESOPHAGOGASTRODUODENOSCOPY (EGD);  Surgeon: Manya Silvas, MD;  Location: [facility] ENDOSCOPY;  Service: Endoscopy;  Laterality: N/A;  . ESOPHAGOGASTRODUODENOSCOPY Bilateral 10/11/2017   Procedure: ESOPHAGOGASTRODUODENOSCOPY (EGD);  Surgeon: Manya Silvas, MD;  Location: [facility] ENDOSCOPY;  Service: Endoscopy;  Laterality: Bilateral;  . ESOPHAGOGASTRODUODENOSCOPY N/A 12/04/2017   Procedure: ESOPHAGOGASTRODUODENOSCOPY (EGD);  Surgeon: Manya Silvas, MD;  Location: [facility] ENDOSCOPY;  Service: Endoscopy;  Laterality: N/A;  . ESOPHAGOGASTRODUODENOSCOPY (EGD) WITH PROPOFOL N/A 07/20/2015   Procedure: ESOPHAGOGASTRODUODENOSCOPY (EGD) WITH PROPOFOL;  Surgeon: Manya Silvas, MD;  Location[facility] ENDOSCOPY;  Service: Endoscopy;  Laterality: N/A;  . ESOPHAGOGASTRODUODENOSCOPY (EGD) WITH PROPOFOL N/A 09/16/2015   Procedure: ESOPHAGOGASTRODUODENOSCOPY (EGD) WITH PROPOFOL;  Surgeon: Manya Silvas, MD;  Location: [facility] ENDOSCOPY;  Service: Endoscopy;  Laterality: N/A;  . ESOPHAGOGASTRODUODENOSCOPY (EGD) WITH PROPOFOL N/A 03/16/2016   Procedure: ESOPHAGOGASTRODUODENOSCOPY (EGD) WITH PROPOFOL;  Surgeon: Manya Silvas, MD;  Location: [facility] ENDOSCOPY;  Service: Endoscopy;  Laterality: N/A;  . ESOPHAGOGASTRODUODENOSCOPY (EGD) WITH PROPOFOL N/A 09/14/2016   Procedure: ESOPHAGOGASTRODUODENOSCOPY (EGD) WITH PROPOFOL;  Surgeon: Manya Silvas, MD;  Location: [facility] ENDOSCOPY;  Service: Endoscopy;  Laterality: N/A;  . ESOPHAGOGASTRODUODENOSCOPY (EGD) WITH PROPOFOL N/A 11/05/2016   Procedure: ESOPHAGOGASTRODUODENOSCOPY (EGD) WITH PROPOFOL;  Surgeon: Manya Silvas, MD;  Location: [facility] ENDOSCOPY;  Service: Endoscopy;  Laterality: N/A;  . ESOPHAGOGASTRODUODENOSCOPY (EGD) WITH PROPOFOL N/A 02/06/2017   Procedure: ESOPHAGOGASTRODUODENOSCOPY (EGD) WITH PROPOFOL;  Surgeon: Manya Silvas, MD;  Location: [facility]  ENDOSCOPY;  Service: Endoscopy;  Laterality: N/A;  . ESOPHAGOGASTRODUODENOSCOPY (EGD) WITH PROPOFOL N/A 04/24/2017   Procedure: ESOPHAGOGASTRODUODENOSCOPY (EGD) WITH PROPOFOL;  Surgeon: Manya Silvas, MD;  Location: [facility] ENDOSCOPY;  Service: Endoscopy;  Laterality: N/A;  . ESOPHAGOGASTRODUODENOSCOPY (EGD) WITH PROPOFOL N/A 07/31/2017   Procedure: ESOPHAGOGASTRODUODENOSCOPY (EGD) WITH PROPOFOL;  Surgeon: Manya Silvas, MD;  Location: [facility] ENDOSCOPY;  Service: Endoscopy;  Laterality: N/A;  . ESOPHAGOGASTRODUODENOSCOPY (EGD) WITH PROPOFOL N/A 08/08/2018   Procedure: ESOPHAGOGASTRODUODENOSCOPY (EGD) WITH PROPOFOL;  Surgeon: Manya Silvas, MD;  Location: [facility] ENDOSCOPY;  Service: Endoscopy;  Laterality: N/A;  . GIVENS CAPSULE STUDY N/A 12/05/2017   Procedure: GIVENS CAPSULE STUDY;  Surgeon: Manya Silvas, MD;  Location: [facility] ENDOSCOPY;  Service: Endoscopy;  Laterality: N/A;  . TONSILLECTOMY    . TONSILLECTOMY AND ADENOIDECTOMY    . ULNAR NERVE TRANSPOSITION    . UVULOPALATOPHARYNGOPLASTY      Prior to Admission medications   Medication Sig Start Date End Date Taking? Authorizing Provider  Amino Acids-Protein Hydrolys (FEEDING SUPPLEMENT, PRO-STAT SUGAR FREE 64,) LIQD Take 30 mLs by mouth 2 (two) times daily with a meal.    [provider]  dextromethorphan (DELSYM) 30 MG/5ML liquid Take 30 mg by mouth 2 (two) times daily.    [provider]  ferrous sulfate 325 (65 FE) MG tablet Take 325 mg by mouth 2 (two) times daily with a meal.     [provider]  furosemide (LASIX) 40 MG tablet Take 1 tablet (40 mg total) by mouth daily. 10/18/18  Gladstone Lighter, MD  HYDROcodone-acetaminophen (NORCO/VICODIN) 5-325 MG tablet Take 1 tablet by mouth every 6 (six) hours as needed for severe pain. 12/09/18   Hillary Bow, MD  insulin aspart protamine - aspart (NOVOLOG 70/30 MIX) (70-30) 100 UNIT/ML FlexPen Inject 85 Units into the skin daily with breakfast.     [provider]  insulin aspart protamine - aspart (NOVOLOG 70/30 MIX) (70-30) 100 UNIT/ML FlexPen Inject 72 Units into the skin at bedtime.     [provider]  loperamide (IMODIUM) 2 MG capsule Take 2 mg by mouth 3 (three) times daily.    [provider]  LORazepam (ATIVAN) 0.5 MG tablet Take 1 tablet (0.5 mg total) by mouth every 8 (eight) hours as needed for anxiety. 10/18/18   Gladstone Lighter, MD  metolazone (ZAROXOLYN) 2.5 MG tablet Take 1 tablet (2.5 mg total) by mouth daily. 10/19/18   Gladstone Lighter, MD  Multiple Vitamin (MULTIVITAMIN) tablet Take 1 tablet by mouth daily.    [provider]  omeprazole (PRILOSEC) 40 MG capsule Take 40 mg by mouth daily.    [provider]  Potassium Chloride ER 20 MEQ TBCR Take 20 mEq by mouth 2 (two) times daily. While on lasix' 10/18/18   Gladstone Lighter, MD  rifaximin (XIFAXAN) 550 MG TABS tablet Take 1 tablet (550 mg total) by mouth 2 (two) times daily. 01/01/18   Loletha Grayer, MD  vancomycin (VANCOCIN) 125 MG capsule Take 1 capsule (125 mg total) by mouth 4 (four) times daily. 12/09/18   Hillary Bow, MD  vitamin C (ASCORBIC ACID) 250 MG tablet Take 500 mg by mouth 2 (two) times daily.    [provider]  zinc sulfate 220 (50 Zn) MG capsule Take 220 mg by mouth daily.    [provider]    Allergies Patient has no known allergies.  Family History  Problem Relation Age of Onset  . Diabetes Other   . Transient ischemic attack Father   . CAD Father     Social History Social History   Tobacco Use  . Smoking status: Current Some Day Smoker    Types: Cigars  . Smokeless tobacco: Never Used  . Tobacco comment: smoke 1 at night occasionally;   Substance Use Topics  . Alcohol use: No    Comment: stopped 12 years ago  . Drug use: No    Review of Systems Constitutional: No fever/chills Eyes: No visual changes. ENT: No sore throat. No stiff neck no neck pain Cardiovascular:  Denies chest pain. Respiratory: Denies shortness of breath. Gastrointestinal:   no vomiting.  No diarrhea.  No constipation. Genitourinary: Negative for dysuria. Musculoskeletal: Negative lower extremity swelling Skin: Negative for rash. Neurological: Negative for severe headaches, focal weakness or numbness.   ____________________________________________   PHYSICAL EXAM:  VITAL SIGNS: ED Triage Vitals  Enc Vitals Group     BP 01/14/19 1832 (!) 172/55     Pulse Rate 01/14/19 1832 82     Resp 01/14/19 1832 16     Temp 01/14/19 1832 97.9 F (36.6 C)     Temp Source 01/14/19 1832 Oral     SpO2 01/14/19 1832 100 %     Weight 01/14/19 1830 270 lb (122.5 kg)     Height 01/14/19 1830 5\' 11"  (1.803 m)     Head Circumference --      Peak Flow --      Pain Score 01/14/19 1830 0     Pain Loc --  Pain Edu? --      Excl. in [place]? --     Constitutional: Alert and oriented. Well appearing and in no acute distress. Eyes: Conjunctivae are normal Head: Atraumatic HEENT: No congestion/rhinnorhea. Mucous membranes are moist.  Oropharynx non-erythematous Neck:   Nontender with no meningismus, no masses, no stridor Cardiovascular: Normal rate, regular rhythm. Grossly normal heart sounds.  Good peripheral circulation. Respiratory: Normal respiratory effort.  No retractions. Lungs CTAB. Abdominal: Soft and nontender. No distention. No guarding no rebound Back:  There is no focal tenderness or step off.  there is no midline tenderness there are no lesions noted. there is no CVA tenderness Musculoskeletal: No lower extremity tenderness, no upper extremity tenderness. No joint effusions, no DVT signs strong distal pulses bilateral lower extremity edema Neurologic:  Normal speech and language. No gross focal neurologic deficits are appreciated.  Patient has a mild tremor diffusely but no focal numbness or weakness nerves are intact.  Speech is normal Skin:  Skin is warm, dry and intact. No rash  noted. Psychiatric: Mood and affect are normal. Speech and behavior are normal.  ____________________________________________   LABS (all labs ordered are listed, but only abnormal results are displayed)  Labs Reviewed  GLUCOSE, CAPILLARY - Abnormal; Notable for the following components:      Result Value   Glucose-Capillary 185 (*)    All other components within normal limits  CBC WITH DIFFERENTIAL/PLATELET - Abnormal; Notable for the following components:   RBC 2.99 (*)    Hemoglobin 8.8 (*)    HCT 27.3 (*)    All other components within normal limits  BASIC METABOLIC PANEL - Abnormal; Notable for the following components:   Glucose, Bld 201 (*)    BUN 44 (*)    Creatinine, Ser 2.08 (*)    GFR calc non Af Amer 32 (*)    GFR calc Af Amer 37 (*)    All other components within normal limits  AMMONIA  URINALYSIS, COMPLETE (UACMP) WITH MICROSCOPIC    Pertinent labs  results that were available during my care of the patient were reviewed by me and considered in my medical decision making (see chart for details). ____________________________________________  EKG  I personally interpreted any EKGs ordered by me or triage Appears to be sinus rhythm, rate 81, normal axis no acute ST elevation or depression.  Slight tremor limits some interpretation of rhythm ____________________________________________  RADIOLOGY  Pertinent labs & imaging results that were available during my care of the patient were reviewed by me and considered in my medical decision making (see chart for details). If possible, patient and/or family made aware of any abnormal findings.  No results found. ____________________________________________    PROCEDURES  Procedure(s) performed: None  Procedures  Critical Care performed: None  ____________________________________________   INITIAL IMPRESSION / ASSESSMENT AND PLAN / ED COURSE  Pertinent labs & imaging results that were available during my  care of the patient were reviewed by me and considered in my medical decision making (see chart for details).  Patient nonfocal neurologic exam here because he feels weak and has had some difficulty speaking, he actually speaking clearly at this time.  He states however that these are signs and symptoms of his elevated ammonia which we will check he has not been able to take his lactulose recently.  We will check diffuse blood work I will get a CT scan of his head there is been no trauma indicated he is not focal but given his findings  a CVA certainly possible.  Time of onset was sometime this morning and actually states he was feeling bad somewhat yesterday as well so I do not think he would certainly be a candidate for TPA.  Chest x-ray also and urinalysis will be obtained  ----------------------------------------- 9:26 PM on 01/14/2019 -----------------------------------------  Patient work-up is unremarkable it is unclear why he has been having speech difficulties during the course of the day, we will admit him for TIA work-up.  Appreciate consult from Dr. Jannifer Franklin.    ____________________________________________   FINAL CLINICAL IMPRESSION(S) / ED DIAGNOSES  Final diagnoses:  None      This chart was dictated using voice recognition software.  Despite best efforts to proofread,  errors can occur which can change meaning.      Schuyler Amor, MD 01/14/19 Pauline Aus    Schuyler Amor, MD 01/14/19 2127

## 2019-01-14 NOTE — ED Notes (Signed)
ED TO INPATIENT HANDOFF REPORT  ED Nurse Name and Phone #: Gwynn Burly Name/Age/Gender Corey West 68 y.o. male Room/Bed: ED25A/ED25A  Code Status   Code Status: Prior  Home/SNF/Other Home Patient oriented to: self, place, time and situation Is this baseline? Yes   Triage Complete: Triage complete  Chief Complaint EMS  Triage Note Pt comes via EMS after family reports some shaking that's new and some unsteadiness on his feet. Pt denies fever, SOB, or cough with EMS. CBG was 177. VSS with EMS. Pt was told at work yesterday morning that he wasn't talking clearly on the phone.    Allergies No Known Allergies  Level of Care/Admitting Diagnosis ED Disposition    ED Disposition Condition [facility] Area: [place] [[id number]]  Level of Care: Med-Surg [16]  Diagnosis: Aphasia Marvelous.[place].3.ICD-9-CM]  Admitting Physician: Lance Coon [[id number]]  Attending Physician: Jannifer Franklin, DAVID [phone]  Possible Covid Disease Patient Isolation: N/A  PT Class (Do Not Modify): Observation [104]  PT Acc Code (Do Not Modify): Observation [10022]       B Medical/Surgery History Past Medical History:  Diagnosis Date  . Anemia   . Anxiety    controlled;   . Arthritis   . AVM (arteriovenous malformation) of stomach, acquired with hemorrhage   . Barrett's esophagus   . Chronic kidney disease    renal infufficiency  . Cirrhosis ([place])   . Depression    controlled;   Marland Kitchen Diabetes mellitus without complication ([place])    not controlled, taking insulin but sugar continues to run high;   . Edema   . Esophageal varices ([place])   . GAVE (gastric antral vascular ectasia)   . GERD (gastroesophageal reflux disease)   . History of hiatal hernia   . Hyperlipidemia   . Hypertension    controlled well;   . 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 Obesity   . Pancytopenia ([place])   . Polyp, stomach    with chronic blood loss  . Sleep apnea    does not wear a cpap, Medicare would  not pay for it   . Venous stasis dermatitis of both lower extremities   . Venous stasis of both lower extremities    with cellulitis   Past Surgical History:  Procedure Laterality Date  . ESOPHAGOGASTRODUODENOSCOPY N/A 02/09/2015   Procedure: ESOPHAGOGASTRODUODENOSCOPY (EGD);  Surgeon: Manya Silvas, MD;  Location: [facility] ENDOSCOPY;  Service: Endoscopy;  Laterality: N/A;  . ESOPHAGOGASTRODUODENOSCOPY N/A 07/22/2015   Procedure: ESOPHAGOGASTRODUODENOSCOPY (EGD);  Surgeon: Manya Silvas, MD;  Location: [facility] ENDOSCOPY;  Service: Endoscopy;  Laterality: N/A;  . ESOPHAGOGASTRODUODENOSCOPY N/A 06/26/2017   Procedure: ESOPHAGOGASTRODUODENOSCOPY (EGD);  Surgeon: Manya Silvas, MD;  Location: [facility] ENDOSCOPY;  Service: Endoscopy;  Laterality: N/A;  . ESOPHAGOGASTRODUODENOSCOPY N/A 06/27/2017   Procedure: ESOPHAGOGASTRODUODENOSCOPY (EGD);  Surgeon: Manya Silvas, MD;  Location: [facility] ENDOSCOPY;  Service: Endoscopy;  Laterality: N/A;  . ESOPHAGOGASTRODUODENOSCOPY N/A 06/28/2017   Procedure: ESOPHAGOGASTRODUODENOSCOPY (EGD);  Surgeon: Manya Silvas, MD;  Location: [facility] ENDOSCOPY;  Service: Endoscopy;  Laterality: N/A;  . ESOPHAGOGASTRODUODENOSCOPY Bilateral 10/11/2017   Procedure: ESOPHAGOGASTRODUODENOSCOPY (EGD);  Surgeon: Manya Silvas, MD;  Location: [facility] ENDOSCOPY;  Service: Endoscopy;  Laterality: Bilateral;  . ESOPHAGOGASTRODUODENOSCOPY N/A 12/04/2017   Procedure: ESOPHAGOGASTRODUODENOSCOPY (EGD);  Surgeon: Manya Silvas, MD;  Location: [facility] ENDOSCOPY;  Service: Endoscopy;  Laterality: N/A;  . ESOPHAGOGASTRODUODENOSCOPY (EGD) WITH PROPOFOL N/A 07/20/2015   Procedure: ESOPHAGOGASTRODUODENOSCOPY (EGD) WITH PROPOFOL;  Surgeon: Manya Silvas,  MD;  Location: [facility] ENDOSCOPY;  Service: Endoscopy;  Laterality: N/A;  . ESOPHAGOGASTRODUODENOSCOPY (EGD) WITH PROPOFOL N/A 09/16/2015   Procedure: ESOPHAGOGASTRODUODENOSCOPY (EGD) WITH PROPOFOL;  Surgeon: Manya Silvas, MD;  Location[facility]  ENDOSCOPY;  Service: Endoscopy;  Laterality: N/A;  . ESOPHAGOGASTRODUODENOSCOPY (EGD) WITH PROPOFOL N/A 03/16/2016   Procedure: ESOPHAGOGASTRODUODENOSCOPY (EGD) WITH PROPOFOL;  Surgeon: Manya Silvas, MD;  Location: [facility] ENDOSCOPY;  Service: Endoscopy;  Laterality: N/A;  . ESOPHAGOGASTRODUODENOSCOPY (EGD) WITH PROPOFOL N/A 09/14/2016   Procedure: ESOPHAGOGASTRODUODENOSCOPY (EGD) WITH PROPOFOL;  Surgeon: Manya Silvas, MD;  Location: [facility] ENDOSCOPY;  Service: Endoscopy;  Laterality: N/A;  . ESOPHAGOGASTRODUODENOSCOPY (EGD) WITH PROPOFOL N/A 11/05/2016   Procedure: ESOPHAGOGASTRODUODENOSCOPY (EGD) WITH PROPOFOL;  Surgeon: Manya Silvas, MD;  Location: [facility] ENDOSCOPY;  Service: Endoscopy;  Laterality: N/A;  . ESOPHAGOGASTRODUODENOSCOPY (EGD) WITH PROPOFOL N/A 02/06/2017   Procedure: ESOPHAGOGASTRODUODENOSCOPY (EGD) WITH PROPOFOL;  Surgeon: Manya Silvas, MD;  Location: [facility] ENDOSCOPY;  Service: Endoscopy;  Laterality: N/A;  . ESOPHAGOGASTRODUODENOSCOPY (EGD) WITH PROPOFOL N/A 04/24/2017   Procedure: ESOPHAGOGASTRODUODENOSCOPY (EGD) WITH PROPOFOL;  Surgeon: Manya Silvas, MD;  Location: [facility] ENDOSCOPY;  Service: Endoscopy;  Laterality: N/A;  . ESOPHAGOGASTRODUODENOSCOPY (EGD) WITH PROPOFOL N/A 07/31/2017   Procedure: ESOPHAGOGASTRODUODENOSCOPY (EGD) WITH PROPOFOL;  Surgeon: Manya Silvas, MD;  Location: [facility] ENDOSCOPY;  Service: Endoscopy;  Laterality: N/A;  . ESOPHAGOGASTRODUODENOSCOPY (EGD) WITH PROPOFOL N/A 08/08/2018   Procedure: ESOPHAGOGASTRODUODENOSCOPY (EGD) WITH PROPOFOL;  Surgeon: Manya Silvas, MD;  Location: [facility] ENDOSCOPY;  Service: Endoscopy;  Laterality: N/A;  . GIVENS CAPSULE STUDY N/A 12/05/2017   Procedure: GIVENS CAPSULE STUDY;  Surgeon: Manya Silvas, MD;  Location: [facility] ENDOSCOPY;  Service: Endoscopy;  Laterality: N/A;  . TONSILLECTOMY    . TONSILLECTOMY AND ADENOIDECTOMY    . ULNAR NERVE TRANSPOSITION    . UVULOPALATOPHARYNGOPLASTY       A IV  Location/Drains/Wounds Patient Lines/Drains/Airways Status   Active Line/Drains/Airways    Name:   Placement date:   Placement time:   Site:   Days:   Peripheral IV 12/02/18 Right Forearm   12/02/18    1509    Forearm   43   Peripheral IV 01/14/19 Right Antecubital   01/14/19    1844    Antecubital   less than 1   Pressure Injury 07/19/18 Stage II -  Partial thickness loss of dermis presenting as a shallow open ulcer with a red, pink wound bed without slough. 3 small stage 3 to buttocks   07/19/18    0000     179   Wound / Incision (Open or Dehisced) 10/16/18 Other (Comment);Non-pressure wound Arm Left Above left elbow 1cm x1/4cm skintear. No drainage.   10/16/18    0100    Arm   90          Intake/Output Last 24 hours No intake or output data in the 24 hours ending 01/14/19 2339  Labs/Imaging Results for orders placed or performed during the hospital encounter of 01/14/19 (from the past 48 hour(s))  Ammonia     Status: None   Collection Time: 01/14/19  6:34 PM  Result Value Ref Range   Ammonia 31 9 - 35 umol/L    Comment: Performed at [facility], [address] [place], [place] [address]  CBC with Differential     Status: Abnormal   Collection Time: 01/14/19  6:35 PM  Result Value Ref Range   WBC 6.8 4.0 - 10.5 K/uL   RBC 2.99 (L) 4.22 - 5.81  MIL/uL   Hemoglobin 8.8 (L) 13.0 - 17.0 g/dL   HCT 27.3 (L) 39.0 - 52.0 %   MCV 91.3 80.0 - 100.0 fL   MCH 29.4 26.0 - 34.0 pg   MCHC 32.2 30.0 - 36.0 g/dL   RDW 14.9 11.5 - 15.5 %   Platelets 111 (L) 150 - 400 K/uL    Comment: Immature Platelet Fraction may be clinically indicated, consider ordering this additional test [id number]    nRBC 0.0 0.0 - 0.2 %   Neutrophils Relative % 88 %   Neutro Abs 5.9 1.7 - 7.7 K/uL   Lymphocytes Relative 7 %   Lymphs Abs 0.5 (L) 0.7 - 4.0 K/uL   Monocytes Relative 5 %   Monocytes Absolute 0.3 0.1 - 1.0 K/uL   Eosinophils Relative 0 %   Eosinophils Absolute 0.0 0.0 - 0.5 K/uL    Basophils Relative 0 %   Basophils Absolute 0.0 0.0 - 0.1 K/uL   Immature Granulocytes 0 %   Abs Immature Granulocytes 0.03 0.00 - 0.07 K/uL    Comment: Performed at [facility], [address] [place], [place] [address]  Basic metabolic panel     Status: Abnormal   Collection Time: 01/14/19  6:35 PM  Result Value Ref Range   Sodium 138 135 - 145 mmol/L   Potassium 3.9 3.5 - 5.1 mmol/L   Chloride 99 98 - 111 mmol/L   CO2 25 22 - 32 mmol/L   Glucose, Bld 201 (H) 70 - 99 mg/dL   BUN 44 (H) 8 - 23 mg/dL   Creatinine, Ser 2.08 (H) 0.61 - 1.24 mg/dL   Calcium 9.0 8.9 - 10.3 mg/dL   GFR calc non Af Amer 32 (L) >60 mL/min   GFR calc Af Amer 37 (L) >60 mL/min   Anion gap 14 5 - 15    Comment: Performed at [facility], [address] [place], [place] [address]  Glucose, capillary     Status: Abnormal   Collection Time: 01/14/19  6:37 PM  Result Value Ref Range   Glucose-Capillary 185 (H) 70 - 99 mg/dL   Dg Chest 2 View  Result Date: 01/14/2019 CLINICAL DATA:  Weakness EXAM: CHEST - 2 VIEW COMPARISON:  None. FINDINGS: Mild cardiomegaly. Both lungs are clear. The visualized skeletal structures are unremarkable. IMPRESSION: No active cardiopulmonary disease. Electronically Signed   By: Ulyses Jarred M.D.   On: 01/14/2019 19:17   Ct Head Wo Contrast  Result Date: 01/14/2019 CLINICAL DATA:  68 y/o  M; infusion and intermittent speech issues. EXAM: CT HEAD WITHOUT CONTRAST TECHNIQUE: Contiguous axial images were obtained from the base of the skull through the vertex without intravenous contrast. COMPARISON:  12/31/2017 CT head FINDINGS: Brain: No evidence of acute infarction, hemorrhage, hydrocephalus, extra-axial collection or mass lesion/mass effect. Stable nonspecific white matter hypodensities compatible with chronic microvascular ischemic changes and stable volume loss of brain. Vascular: Calcific atherosclerosis of carotid siphons. No hyperdense vessel identified. Skull:  Normal. Negative for fracture or focal lesion. Sinuses/Orbits: No acute finding. Other: 15 mm suboccipital dermal cyst. IMPRESSION: 1. No acute intracranial abnormality identified. 2. Stable chronic microvascular ischemic changes and volume loss of the brain. Electronically Signed   By: Kristine Garbe M.D.   On: 01/14/2019 19:15    Pending Labs Unresulted Labs (From admission, onward)    Start     Ordered   01/14/19 1852  Urinalysis, Complete w Microscopic  ONCE - STAT,   STAT  01/14/19 1851   Signed and Held  CBC  (heparin)  Once,   R    Comments:  Baseline for heparin therapy IF NOT ALREADY DRAWN.  Notify MD if PLT < 100 K.    Signed and Held   Signed and Held  Creatinine, serum  (heparin)  Once,   R    Comments:  Baseline for heparin therapy IF NOT ALREADY DRAWN.    Signed and Held   Signed and Held  Lipid panel  Tomorrow morning,   R    Comments:  Fasting    Signed and Held   Signed and Held  Hemoglobin A1c  Tomorrow morning,   R     Signed and Held          Vitals/Pain Today's Vitals   01/14/19 1845 01/14/19 2115 01/14/19 2130 01/14/19 2145  BP:      Pulse: 75 70 68 65  Resp: 11 13 15 15   Temp:      TempSrc:      SpO2: 100% 100% 100% 100%  Weight:      Height:      PainSc:        Isolation Precautions No active isolations  Medications Medications - No data to display  Mobility walks Low fall risk   Focused Assessments    R Recommendations: See Admitting Provider Note  Report given to:   Additional Notes: none

## 2019-01-14 NOTE — H&P (Signed)
[facility] [place] NAME: Corey West    MR#:  [id number]  DATE OF BIRTH:  1951/01/11  DATE OF ADMISSION:  01/14/2019  PRIMARY CARE PHYSICIAN: Baxter Hire, MD   REQUESTING/REFERRING PHYSICIAN: Burlene Arnt, MD  CHIEF COMPLAINT:   Chief Complaint  Patient presents with  . Altered Mental Status    HISTORY OF PRESENT ILLNESS:  Goerge West  is a 68 y.o. male who presents with chief complaint as above.  Patient presents to the ED after an episode of transient aphasia.  He has a history of alcoholic cirrhosis and oftentimes has hepatic encephalopathy.  He states that he recently had an elevated ammonia level, but was treated for it.  He thought perhaps that it had happened today, but his ammonia level was normal here in the ED.  He has significant risk factors for stroke, and hospitalist were called for admission and further evaluation  PAST MEDICAL HISTORY:   Past Medical History:  Diagnosis Date  . Anemia   . Anxiety    controlled;   . Arthritis   . AVM (arteriovenous malformation) of stomach, acquired with hemorrhage   . Barrett's esophagus   . Chronic kidney disease    renal infufficiency  . Cirrhosis ([place])   . Depression    controlled;   Marland Kitchen Diabetes mellitus without complication ([name])    not controlled, taking insulin but sugar continues to run high;   . Edema   . Esophageal varices ([place])   . GAVE (gastric antral vascular ectasia)   . GERD (gastroesophageal reflux disease)   . History of hiatal hernia   . Hyperlipidemia   . Hypertension    controlled well;   . Nephropathy, diabetic ([place])   . Obesity   . Pancytopenia ([place])   . Polyp, stomach    with chronic blood loss  . Sleep apnea    does not wear a cpap, Medicare would not pay for it   . Venous stasis dermatitis of both lower extremities   . Venous stasis of both lower extremities    with cellulitis     PAST SURGICAL HISTORY:   Past Surgical History:   Procedure Laterality Date  . ESOPHAGOGASTRODUODENOSCOPY N/A 02/09/2015   Procedure: ESOPHAGOGASTRODUODENOSCOPY (EGD);  Surgeon: Manya Silvas, MD;  Location: [facility] ENDOSCOPY;  Service: Endoscopy;  Laterality: N/A;  . ESOPHAGOGASTRODUODENOSCOPY N/A 07/22/2015   Procedure: ESOPHAGOGASTRODUODENOSCOPY (EGD);  Surgeon: Manya Silvas, MD;  Location[facility] ENDOSCOPY;  Service: Endoscopy;  Laterality: N/A;  . ESOPHAGOGASTRODUODENOSCOPY N/A 06/26/2017   Procedure: ESOPHAGOGASTRODUODENOSCOPY (EGD);  Surgeon: Manya Silvas, MD;  Location: [facility] ENDOSCOPY;  Service: Endoscopy;  Laterality: N/A;  . ESOPHAGOGASTRODUODENOSCOPY N/A 06/27/2017   Procedure: ESOPHAGOGASTRODUODENOSCOPY (EGD);  Surgeon: Manya Silvas, MD;  Location: [facility] ENDOSCOPY;  Service: Endoscopy;  Laterality: N/A;  . ESOPHAGOGASTRODUODENOSCOPY N/A 06/28/2017   Procedure: ESOPHAGOGASTRODUODENOSCOPY (EGD);  Surgeon: Manya Silvas, MD;  Location: [facility] ENDOSCOPY;  Service: Endoscopy;  Laterality: N/A;  . ESOPHAGOGASTRODUODENOSCOPY Bilateral 10/11/2017   Procedure: ESOPHAGOGASTRODUODENOSCOPY (EGD);  Surgeon: Manya Silvas, MD;  Location: [facility] ENDOSCOPY;  Service: Endoscopy;  Laterality: Bilateral;  . ESOPHAGOGASTRODUODENOSCOPY N/A 12/04/2017   Procedure: ESOPHAGOGASTRODUODENOSCOPY (EGD);  Surgeon: Manya Silvas, MD;  Location: [facility] ENDOSCOPY;  Service: Endoscopy;  Laterality: N/A;  . ESOPHAGOGASTRODUODENOSCOPY (EGD) WITH PROPOFOL N/A 07/20/2015   Procedure: ESOPHAGOGASTRODUODENOSCOPY (EGD) WITH PROPOFOL;  Surgeon: Manya Silvas, MD;  Location: [facility] ENDOSCOPY;  Service: Endoscopy;  Laterality: N/A;  . ESOPHAGOGASTRODUODENOSCOPY (EGD) WITH PROPOFOL  N/A 09/16/2015   Procedure: ESOPHAGOGASTRODUODENOSCOPY (EGD) WITH PROPOFOL;  Surgeon: Manya Silvas, MD;  Location: [facility] ENDOSCOPY;  Service: Endoscopy;  Laterality: N/A;  . ESOPHAGOGASTRODUODENOSCOPY (EGD) WITH PROPOFOL N/A 03/16/2016   Procedure: ESOPHAGOGASTRODUODENOSCOPY (EGD)  WITH PROPOFOL;  Surgeon: Manya Silvas, MD;  Location: [facility] ENDOSCOPY;  Service: Endoscopy;  Laterality: N/A;  . ESOPHAGOGASTRODUODENOSCOPY (EGD) WITH PROPOFOL N/A 09/14/2016   Procedure: ESOPHAGOGASTRODUODENOSCOPY (EGD) WITH PROPOFOL;  Surgeon: Manya Silvas, MD;  Location: [facility] ENDOSCOPY;  Service: Endoscopy;  Laterality: N/A;  . ESOPHAGOGASTRODUODENOSCOPY (EGD) WITH PROPOFOL N/A 11/05/2016   Procedure: ESOPHAGOGASTRODUODENOSCOPY (EGD) WITH PROPOFOL;  Surgeon: Manya Silvas, MD;  Location: [facility] ENDOSCOPY;  Service: Endoscopy;  Laterality: N/A;  . ESOPHAGOGASTRODUODENOSCOPY (EGD) WITH PROPOFOL N/A 02/06/2017   Procedure: ESOPHAGOGASTRODUODENOSCOPY (EGD) WITH PROPOFOL;  Surgeon: Manya Silvas, MD;  Location: [facility] ENDOSCOPY;  Service: Endoscopy;  Laterality: N/A;  . ESOPHAGOGASTRODUODENOSCOPY (EGD) WITH PROPOFOL N/A 04/24/2017   Procedure: ESOPHAGOGASTRODUODENOSCOPY (EGD) WITH PROPOFOL;  Surgeon: Manya Silvas, MD;  Location: [facility] ENDOSCOPY;  Service: Endoscopy;  Laterality: N/A;  . ESOPHAGOGASTRODUODENOSCOPY (EGD) WITH PROPOFOL N/A 07/31/2017   Procedure: ESOPHAGOGASTRODUODENOSCOPY (EGD) WITH PROPOFOL;  Surgeon: Manya Silvas, MD;  Location: [facility] ENDOSCOPY;  Service: Endoscopy;  Laterality: N/A;  . ESOPHAGOGASTRODUODENOSCOPY (EGD) WITH PROPOFOL N/A 08/08/2018   Procedure: ESOPHAGOGASTRODUODENOSCOPY (EGD) WITH PROPOFOL;  Surgeon: Manya Silvas, MD;  Location: [facility] ENDOSCOPY;  Service: Endoscopy;  Laterality: N/A;  . GIVENS CAPSULE STUDY N/A 12/05/2017   Procedure: GIVENS CAPSULE STUDY;  Surgeon: Manya Silvas, MD;  Location: [facility] ENDOSCOPY;  Service: Endoscopy;  Laterality: N/A;  . TONSILLECTOMY    . TONSILLECTOMY AND ADENOIDECTOMY    . ULNAR NERVE TRANSPOSITION    . UVULOPALATOPHARYNGOPLASTY       SOCIAL HISTORY:   Social History   Tobacco Use  . Smoking status: Current Some Day Smoker    Types: Cigars  . Smokeless tobacco: Never Used  . Tobacco comment: smoke 1 at  night occasionally;   Substance Use Topics  . Alcohol use: No    Comment: stopped 12 years ago     FAMILY HISTORY:   Family History  Problem Relation Age of Onset  . Diabetes Other   . Transient ischemic attack Father   . CAD Father      DRUG ALLERGIES:  No Known Allergies  MEDICATIONS AT HOME:   Prior to Admission medications   Medication Sig Start Date End Date Taking? Authorizing Provider  Amino Acids-Protein Hydrolys (FEEDING SUPPLEMENT, PRO-STAT SUGAR FREE 64,) LIQD Take 30 mLs by mouth 2 (two) times daily with a meal.    [provider]  dextromethorphan (DELSYM) 30 MG/5ML liquid Take 30 mg by mouth 2 (two) times daily.    [provider]  famotidine (PEPCID) 20 MG tablet Take 20 mg by mouth 2 (two) times daily. 01/13/19   [provider]  ferrous sulfate 325 (65 FE) MG tablet Take 325 mg by mouth 2 (two) times daily with a meal.     [provider]  furosemide (LASIX) 40 MG tablet Take 1 tablet (40 mg total) by mouth daily. 10/18/18   Gladstone Lighter, MD  HYDROcodone-acetaminophen (NORCO/VICODIN) 5-325 MG tablet Take 1 tablet by mouth every 6 (six) hours as needed for severe pain. 12/09/18   Hillary Bow, MD  insulin aspart protamine - aspart (NOVOLOG 70/30 MIX) (70-30) 100 UNIT/ML FlexPen Inject 85 Units into the skin daily with breakfast.     [provider]  insulin aspart protamine -  aspart (NOVOLOG 70/30 MIX) (70-30) 100 UNIT/ML FlexPen Inject 72 Units into the skin at bedtime.     [provider]  lactulose (CHRONULAC) 10 GM/15ML solution  12/11/18   [provider]  loperamide (IMODIUM) 2 MG capsule Take 2 mg by mouth 3 (three) times daily.    [provider]  LORazepam (ATIVAN) 0.5 MG tablet Take 1 tablet (0.5 mg total) by mouth every 8 (eight) hours as needed for anxiety. 10/18/18   Gladstone Lighter, MD  metolazone (ZAROXOLYN) 5 MG tablet Take 5 mg by mouth daily. 01/09/19   [provider]  Multiple Vitamin (MULTIVITAMIN) tablet Take 1 tablet by mouth daily.    [provider]  omeprazole (PRILOSEC) 40 MG capsule Take 40 mg by mouth daily.    [provider]  Potassium Chloride ER 20 MEQ TBCR Take 20 mEq by mouth 2 (two) times daily. While on lasix' 10/18/18   Gladstone Lighter, MD  rifaximin (XIFAXAN) 550 MG TABS tablet Take 1 tablet (550 mg total) by mouth 2 (two) times daily. 01/01/18   Loletha Grayer, MD  traMADol (ULTRAM) 50 MG tablet Take 50 mg by mouth 3 (three) times daily as needed. 12/24/18   [provider]  ULTICARE MINI PEN NEEDLES 31G X 6 MM [place]  01/13/19   [provider]  vancomycin (VANCOCIN) 125 MG capsule Take 1 capsule (125 mg total) by mouth 4 (four) times daily. 12/09/18   Hillary Bow, MD  vitamin C (ASCORBIC ACID) 250 MG tablet Take 500 mg by mouth 2 (two) times daily.    [provider]  zinc sulfate 220 (50 Zn) MG capsule Take 220 mg by mouth daily.    [provider]    REVIEW OF SYSTEMS:  Review of Systems  Constitutional: Negative for chills, fever, malaise/fatigue and weight loss.  HENT: Negative for ear pain, hearing loss and tinnitus.   Eyes: Negative for blurred vision, double vision, pain and redness.  Respiratory: Negative for cough, hemoptysis and shortness of breath.   Cardiovascular: Negative for chest pain, palpitations, orthopnea and leg swelling.  Gastrointestinal: Negative for abdominal pain, constipation, diarrhea, nausea and vomiting.  Genitourinary: Negative for dysuria, frequency and hematuria.  Musculoskeletal: Negative for back pain, joint pain and neck pain.  Skin:       No acne, rash, or lesions  Neurological: Positive for speech change. Negative for dizziness, tremors, focal weakness and weakness.  Endo/Heme/Allergies: Negative for polydipsia. Does not bruise/bleed easily.  Psychiatric/Behavioral: Negative for depression. The patient is not nervous/anxious  and does not have insomnia.      VITAL SIGNS:   Vitals:   01/14/19 1845 01/14/19 2115 01/14/19 2130 01/14/19 2145  BP:      Pulse: 75 70 68 65  Resp: 11 13 15 15   Temp:      TempSrc:      SpO2: 100% 100% 100% 100%  Weight:      Height:       Wt Readings from Last 3 Encounters:  01/14/19 122.5 kg  12/09/18 127.2 kg  10/16/18 130.6 kg    PHYSICAL EXAMINATION:  Physical Exam  Vitals reviewed. Constitutional: He is oriented to person, place, and time. He appears well-developed and well-nourished. No distress.  HENT:  Head: Normocephalic and atraumatic.  Mouth/Throat: Oropharynx is clear and moist.  Eyes: Pupils are equal, round, and reactive to light. Conjunctivae and EOM are normal. No scleral icterus.  Neck: Normal range of motion. Neck supple. No JVD present.  No thyromegaly present.  Cardiovascular: Normal rate, regular rhythm and intact distal pulses. Exam reveals no gallop and no friction rub.  No murmur heard. Respiratory: Effort normal and breath sounds normal. No respiratory distress. He has no wheezes. He has no rales.  GI: Soft. Bowel sounds are normal. He exhibits no distension. There is no abdominal tenderness.  Musculoskeletal: Normal range of motion.        General: No edema.     Comments: No arthritis, no gout  Lymphadenopathy:    He has no cervical adenopathy.  Neurological: He is alert and oriented to person, place, and time. No cranial nerve deficit.  Neurologic: Cranial nerves II-XII intact, Sensation intact to light touch/pinprick, 5/5 strength in all extremities, no dysarthria, no aphasia, no dysphagia, memory intact, finger to nose testing showed no abnormality, no pronator drift  Skin: Skin is warm and dry. No rash noted. No erythema.  Psychiatric: He has a normal mood and affect. His behavior is normal. Judgment and thought content normal.    LABORATORY PANEL:   CBC Recent Labs  Lab 01/14/19 1835  WBC 6.8  HGB 8.8*  HCT 27.3*  PLT 111*    ------------------------------------------------------------------------------------------------------------------  Chemistries  Recent Labs  Lab 01/14/19 1835  NA 138  K 3.9  CL 99  CO2 25  GLUCOSE 201*  BUN 44*  CREATININE 2.08*  CALCIUM 9.0   ------------------------------------------------------------------------------------------------------------------  Cardiac Enzymes No results for input(s): TROPONINI in the last 168 hours. ------------------------------------------------------------------------------------------------------------------  RADIOLOGY:  Dg Chest 2 View  Result Date: 01/14/2019 CLINICAL DATA:  Weakness EXAM: CHEST - 2 VIEW COMPARISON:  None. FINDINGS: Mild cardiomegaly. Both lungs are clear. The visualized skeletal structures are unremarkable. IMPRESSION: No active cardiopulmonary disease. Electronically Signed   By: Ulyses Jarred M.D.   On: 01/14/2019 19:17   Ct Head Wo Contrast  Result Date: 01/14/2019 CLINICAL DATA:  68 y/o  M; infusion and intermittent speech issues. EXAM: CT HEAD WITHOUT CONTRAST TECHNIQUE: Contiguous axial images were obtained from the base of the skull through the vertex without intravenous contrast. COMPARISON:  12/31/2017 CT head FINDINGS: Brain: No evidence of acute infarction, hemorrhage, hydrocephalus, extra-axial collection or mass lesion/mass effect. Stable nonspecific white matter hypodensities compatible with chronic microvascular ischemic changes and stable volume loss of brain. Vascular: Calcific atherosclerosis of carotid siphons. No hyperdense vessel identified. Skull: Normal. Negative for fracture or focal lesion. Sinuses/Orbits: No acute finding. Other: 15 mm suboccipital dermal cyst. IMPRESSION: 1. No acute intracranial abnormality identified. 2. Stable chronic microvascular ischemic changes and volume loss of the brain. Electronically Signed   By: Kristine Garbe M.D.   On: 01/14/2019 19:15    EKG:   Orders  placed or performed during the hospital encounter of 01/14/19  . EKG 12-Lead  . EKG 12-Lead  . EKG 12-Lead  . EKG 12-Lead    IMPRESSION AND PLAN:  Principal Problem:   Aphasia -suspicion for possible stroke/TIA.  Admit per stroke/TIA admission order set with appropriate imaging/consults/labs Active Problems:   Acute on chronic renal failure (HCC) -gentle IV fluids tonight, avoid nephrotoxins and monitor   Cirrhosis ([place]) -continue home meds, avoid hepatotoxins   Type 2 diabetes mellitus (HCC) -sliding scale insulin and carb modified diet   Gastroesophageal reflux disease -home dose PPI   Hyperlipidemia -home dose antilipid  Chart review performed and case discussed with ED provider. Labs, imaging and/or ECG reviewed by provider and discussed with patient/family. Management plans discussed with the patient and/or family.  DVT PROPHYLAXIS: SubQ lovenox  GI PROPHYLAXIS:  PPI   ADMISSION STATUS: Observation  CODE STATUS: Full Code Status History    Date Active Date Inactive Code Status Order ID Comments User Context   12/02/2018 1713 12/09/2018 1737 Full Code [id number]  Hillary Bow, MD ED   10/16/2018 0054 10/18/2018 1842 Full Code [id number]  [name], MD Inpatient   07/18/2018 2339 07/21/2018 1356 Full Code [id number]  Lance Coon, MD Inpatient   06/18/2018 1609 06/19/2018 1717 Full Code [id number]  Epifanio Lesches, MD ED   12/31/2017 1856 01/01/2018 1205 Full Code [id number]  Saundra Shelling, MD Inpatient   12/25/2017 1456 12/26/2017 1618 Full Code [id number]  Epifanio Lesches, MD ED   12/16/2017 0921 12/18/2017 1635 DNR [id number]  Max Sane, MD Inpatient   12/16/2017 0305 12/16/2017 0921 Full Code [id number]  Salary, Avel Peace, MD ED   12/02/2017 2003 12/05/2017 2000 Full Code [id number]  Saundra Shelling, MD Inpatient   11/21/2017 1948 11/23/2017 1958 Full Code [id number]  Loletha Grayer, MD ED   10/23/2017 1405 10/26/2017 1402 Full Code [id number]  Bettey Costa, MD Inpatient    10/18/2017 0058 10/19/2017 1806 Full Code [id number]  Saundra Shelling, MD Inpatient   10/09/2017 2139 10/11/2017 1806 Full Code [id number]  [name], MD Inpatient   09/03/2017 2124 09/05/2017 1552 Full Code [id number]  Nicholes Mango, MD Inpatient   06/26/2017 1119 06/28/2017 1748 Full Code [id number]  Henreitta Leber, MD Inpatient   02/21/2017 2007 02/22/2017 1803 Full Code [id number]  [name], MD Inpatient   07/20/2015 1245 07/22/2015 2217 Full Code [id number]  Nicholes Mango, MD Inpatient   02/08/2015 2331 02/11/2015 1943 Full Code [id number]  Hower, Aaron Mose, MD ED      TOTAL TIME TAKING CARE OF THIS PATIENT: 40 minutes.   Ethlyn Daniels 01/14/2019, 11:21 PM  [facility]  Office  [phone]  CC: Primary care physician; Baxter Hire, MD  Note:  This document was prepared using Dragon voice recognition software and may include unintentional dictation errors.

## 2019-01-14 NOTE — ED Triage Notes (Signed)
Pt comes via EMS after family reports some shaking that's new and some unsteadiness on his feet. Pt denies fever, SOB, or cough with EMS. CBG was 177. VSS with EMS. Pt was told at work yesterday morning that he wasn't talking clearly on the phone.

## 2019-01-15 ENCOUNTER — Encounter: Payer: Self-pay | Admitting: Radiology

## 2019-01-15 ENCOUNTER — Observation Stay: Payer: Medicare Other

## 2019-01-15 ENCOUNTER — Other Ambulatory Visit: Payer: Self-pay

## 2019-01-15 DIAGNOSIS — R479 Unspecified speech disturbances: Secondary | ICD-10-CM | POA: Diagnosis not present

## 2019-01-15 LAB — CBC
HCT: 25 % — ABNORMAL LOW (ref 39.0–52.0)
Hemoglobin: 8.2 g/dL — ABNORMAL LOW (ref 13.0–17.0)
MCH: 29.6 pg (ref 26.0–34.0)
MCHC: 32.8 g/dL (ref 30.0–36.0)
MCV: 90.3 fL (ref 80.0–100.0)
Platelets: 112 10*3/uL — ABNORMAL LOW (ref 150–400)
RBC: 2.77 MIL/uL — ABNORMAL LOW (ref 4.22–5.81)
RDW: 14.8 % (ref 11.5–15.5)
WBC: 5.3 10*3/uL (ref 4.0–10.5)
nRBC: 0 % (ref 0.0–0.2)

## 2019-01-15 LAB — LIPID PANEL
Cholesterol: 179 mg/dL (ref 0–200)
HDL: 41 mg/dL (ref >40)
LDL Cholesterol: 124 mg/dL — ABNORMAL HIGH (ref 0–99)
Total CHOL/HDL Ratio: 4.4 RATIO
Triglycerides: 72 mg/dL (ref <150)
VLDL: 14 mg/dL (ref 0–40)

## 2019-01-15 LAB — CREATININE, SERUM
Creatinine, Ser: 1.94 mg/dL — ABNORMAL HIGH (ref 0.61–1.24)
GFR calc Af Amer: 41 mL/min — ABNORMAL LOW (ref >60)
GFR calc non Af Amer: 35 mL/min — ABNORMAL LOW (ref >60)

## 2019-01-15 LAB — GLUCOSE, CAPILLARY
Glucose-Capillary: 142 mg/dL — ABNORMAL HIGH (ref 70–99)
Glucose-Capillary: 134 mg/dL — ABNORMAL HIGH (ref 70–99)
Glucose-Capillary: 177 mg/dL — ABNORMAL HIGH (ref 70–99)
Glucose-Capillary: 183 mg/dL — ABNORMAL HIGH (ref 70–99)

## 2019-01-15 LAB — HEMOGLOBIN A1C
Hgb A1c MFr Bld: 8.1 % — ABNORMAL HIGH (ref 4.8–5.6)
Mean Plasma Glucose: 185.77 mg/dL

## 2019-01-15 MED ORDER — INSULIN ASPART 100 UNIT/ML ~~LOC~~ SOLN
0.0000 [IU] | Freq: Three times a day (TID) | SUBCUTANEOUS | Status: DC
  Administered 2019-01-15: 12:00:00 2 [IU] via SUBCUTANEOUS
  Administered 2019-01-15: 10:00:00 1 [IU] via SUBCUTANEOUS
  Filled 2019-01-15 (×2): qty 1

## 2019-01-15 MED ORDER — ACETAMINOPHEN 160 MG/5ML PO SOLN
650.0000 mg | ORAL | Status: DC | PRN
  Filled 2019-01-15: qty 20.3

## 2019-01-15 MED ORDER — METOLAZONE 5 MG PO TABS
5.0000 mg | ORAL_TABLET | Freq: Every day | ORAL | Status: DC
  Administered 2019-01-15: 10:00:00 5 mg via ORAL
  Filled 2019-01-15: qty 1

## 2019-01-15 MED ORDER — INSULIN ASPART PROT & ASPART (70-30 MIX) 100 UNIT/ML ~~LOC~~ SUSP
85.0000 [IU] | Freq: Every day | SUBCUTANEOUS | Status: DC
  Administered 2019-01-15: 10:00:00 85 [IU] via SUBCUTANEOUS
  Filled 2019-01-15: qty 10

## 2019-01-15 MED ORDER — SODIUM CHLORIDE 0.9 % IV SOLN
INTRAVENOUS | Status: AC
  Administered 2019-01-15: 03:00:00 via INTRAVENOUS

## 2019-01-15 MED ORDER — FERROUS SULFATE 325 (65 FE) MG PO TABS
325.0000 mg | ORAL_TABLET | Freq: Two times a day (BID) | ORAL | Status: DC
  Administered 2019-01-15: 325 mg via ORAL
  Filled 2019-01-15: qty 1

## 2019-01-15 MED ORDER — ACETAMINOPHEN 325 MG PO TABS: 650.0000 mg | ORAL_TABLET | ORAL | Status: DC | PRN

## 2019-01-15 MED ORDER — RIFAXIMIN 550 MG PO TABS
550.0000 mg | ORAL_TABLET | Freq: Two times a day (BID) | ORAL | Status: DC
  Administered 2019-01-15: 10:00:00 550 mg via ORAL
  Filled 2019-01-15: qty 1

## 2019-01-15 MED ORDER — FAMOTIDINE 20 MG PO TABS
20.0000 mg | ORAL_TABLET | Freq: Two times a day (BID) | ORAL | Status: DC
  Administered 2019-01-15: 20 mg via ORAL
  Filled 2019-01-15: qty 1

## 2019-01-15 MED ORDER — LORAZEPAM 0.5 MG PO TABS: 0.5000 mg | ORAL_TABLET | Freq: Three times a day (TID) | ORAL | Status: DC | PRN

## 2019-01-15 MED ORDER — HEPARIN SODIUM (PORCINE) 5000 UNIT/ML IJ SOLN
5000.0000 [IU] | Freq: Three times a day (TID) | INTRAMUSCULAR | Status: DC
  Administered 2019-01-15: 06:00:00 5000 [IU] via SUBCUTANEOUS
  Filled 2019-01-15: qty 1

## 2019-01-15 MED ORDER — FUROSEMIDE 40 MG PO TABS
40.0000 mg | ORAL_TABLET | Freq: Every day | ORAL | Status: DC
  Administered 2019-01-15: 10:00:00 40 mg via ORAL
  Filled 2019-01-15: qty 1

## 2019-01-15 MED ORDER — STROKE: EARLY STAGES OF RECOVERY BOOK
Freq: Once | Status: AC
  Administered 2019-01-15: 01:00:00

## 2019-01-15 MED ORDER — ACETAMINOPHEN 650 MG RE SUPP: 650.0000 mg | RECTAL | Status: DC | PRN

## 2019-01-15 MED ORDER — PANTOPRAZOLE SODIUM 40 MG PO TBEC
40.0000 mg | DELAYED_RELEASE_TABLET | Freq: Every day | ORAL | Status: DC
  Administered 2019-01-15: 10:00:00 40 mg via ORAL
  Filled 2019-01-15: qty 1

## 2019-01-15 NOTE — Progress Notes (Signed)
SLP Cancellation Note  Patient Details Name: Corey West MRN: [id number] DOB: 1951/01/24   Cancelled treatment:       Reason Eval/Treat Not Completed: SLP screened, no needs identified, will sign off  Spoke with patient, he reports no change in speech or communication.  No difficulty observed with expressing himself.  Leroy Sea, MS/CCC- SLP  Lou Miner 01/15/2019, 11:31 AM

## 2019-01-15 NOTE — Progress Notes (Signed)
OT Cancellation Note  Patient Details Name: Corey West MRN: [id number] DOB: [birth year]   Cancelled Treatment:    Reason Eval/Treat Not Completed: Patient at procedure or test/ unavailable. Consult received, chart reviewed. Pt out of room for testing. Will re-attempt OT evaluation at later date/time as pt is available and medically appropriate.   Jeni Salles, MPH, MS, OTR/L ascom [phone] 01/15/19, 8:48 AM

## 2019-01-15 NOTE — TOC Progression Note (Signed)
Transition of Care [facility]) - Progression Note    Patient Details  Name: Corey West MRN: [id number] Date of Birth: Dec 07, 1950  Transition of Care [facility]) CM/SW [place], RN Phone Number: 01/15/2019, 9:12 AM  Clinical Narrative:     Went in to see patient to complete evaluation, out of room for testing, will come again once back in the room       Expected Discharge Plan and Services                                     Social Determinants of Health (SDOH) Interventions    Readmission Risk Interventions No flowsheet data found.

## 2019-01-15 NOTE — TOC Initial Note (Signed)
Transition of Care [facility]) - Initial/Assessment Note    Patient Details  Name: Corey West MRN: [id number] Date of Birth: 1951-01-13  Transition of Care [facility]) CM/SW Contact:    Su Hilt, RN Phone Number: 01/15/2019, 10:07 AM  Clinical Narrative:                 Met with the patient to discuss DC plan and needs, he states that he loves alone and is completely independent.  He has Cane and walker and BSC at home and has no other  DME needs. He stated that he does not want or need [place] services and he is ready to go home  PCP is [employer] is [employer] He has no problem getting his medication  He has no problem with transportation   Expected Discharge Plan: Home/Self Care Barriers to Discharge: Barriers Resolved   Patient Goals and CMS Choice Patient states their goals for this hospitalization and ongoing recovery are:: go home CMS Medicare.gov Compare Post Acute Care list provided to:: (declines)    Expected Discharge Plan and Services Expected Discharge Plan: Home/Self Care   Discharge Planning Services: CM Consult   Living arrangements for the past 2 months: Single Family Home Expected Discharge Date: 01/15/19               DME Arranged: (denies need)   HH Arranged: (refuses)    Prior Living Arrangements/Services Living arrangements for the past 2 months: Single Family Home Lives with:: Self Patient language and need for interpreter reviewed:: No Do you feel safe going back to the place where you live?: Yes      Need for Family Participation in Patient Care: No (Comment) Care giver support system in place?: Yes (comment) Current home services: DME(walker, bedside commode, cane, grab bars) Criminal Activity/Legal Involvement Pertinent to Current Situation/Hospitalization: No - Comment as needed  Activities of Daily Living Home Assistive Devices/Equipment: Cane (specify quad or straight) ADL Screening (condition at time of admission) Patient's  cognitive ability adequate to safely complete daily activities?: Yes Is the patient deaf or have difficulty hearing?: No Does the patient have difficulty seeing, even when wearing glasses/contacts?: No Does the patient have difficulty concentrating, remembering, or making decisions?: No Patient able to express need for assistance with ADLs?: No Does the patient have difficulty dressing or bathing?: No Independently performs ADLs?: Yes (appropriate for developmental age) Does the patient have difficulty walking or climbing stairs?: Yes Weakness of Legs: Both Weakness of Arms/Hands: None  Permission Sought/Granted                  Emotional Assessment Appearance:: Appears stated age Attitude/Demeanor/Rapport: Engaged Affect (typically observed): Accepting Orientation: : Oriented to Self, Oriented to Place, Oriented to  Time, Oriented to Situation Alcohol / Substance Use: Tobacco Use Psych Involvement: No (comment)  Admission diagnosis:  Speech disorder [R47.9] Patient Active Problem List   Diagnosis Date Noted  . Aphasia 01/14/2019  . Acute on chronic renal failure ([place]) 01/14/2019  . Malnutrition of moderate degree 12/03/2018  . C. difficile diarrhea 12/02/2018  . Ankle fracture, left, closed, initial encounter 10/15/2018  . Ankle fracture, right, closed, initial encounter 10/15/2018  . Ankle fracture 10/15/2018  . Pressure injury of skin 07/19/2018  . UTI (urinary tract infection) 07/18/2018  . Goals of care, counseling/discussion   . Palliative care encounter   . Hepatic encephalopathy syndrome ([place]) 12/16/2017  . Hepatic encephalopathy ([place]) 10/18/2017  . Hypokalemia 10/09/2017  . Hyperglycemia 10/09/2017  .  Symptomatic anemia 06/26/2017  . Sepsis ([place]) 06/12/2017  . Cellulitis in diabetic foot ([place]) 02/21/2017  . Uncontrolled diabetes mellitus ([place]) 02/21/2017  . Cellulitis 02/21/2017  . GI bleed 07/20/2015  . Hematemesis 02/08/2015  . Cirrhosis ([place]) 02/08/2015   . Esophageal varices ([place]) 02/08/2015  . Type 2 diabetes mellitus ([place]) 02/08/2015  . Gastroesophageal reflux disease 02/08/2015  . Hyperlipidemia 02/08/2015  . Iron deficiency anemia 12/04/2013  . Thrombocytopenia ([place]) 12/04/2013  . Splenomegaly 11/04/2013   PCP:  Baxter Hire, MD Pharmacy:   [place], [place] - [address] [address] Phone: [phone] Fax: [phone]     Social Determinants of Health (SDOH) Interventions    Readmission Risk Interventions No flowsheet data found.

## 2019-01-15 NOTE — Discharge Summary (Signed)
[facility] NAME: Corey West    MR#:  [id number]  DATE OF BIRTH:  July 28, 1951  DATE OF ADMISSION:  01/14/2019 ADMITTING PHYSICIAN: Lance Coon, MD  DATE OF DISCHARGE: 01/15/2019  PRIMARY CARE PHYSICIAN: Baxter Hire, MD    ADMISSION DIAGNOSIS:  Speech disorder [R47.9]  DISCHARGE DIAGNOSIS:  Principal Problem:   Aphasia Active Problems:   Cirrhosis ([place])   Type 2 diabetes mellitus ([place])   Gastroesophageal reflux disease   Hyperlipidemia   Thrombocytopenia (HCC)   Acute on chronic renal failure ([place])   SECONDARY DIAGNOSIS:   Past Medical History:  Diagnosis Date  . Anemia   . Anxiety    controlled;   . Arthritis   . AVM (arteriovenous malformation) of stomach, acquired with hemorrhage   . Barrett's esophagus   . Chronic kidney disease    renal infufficiency  . Cirrhosis ([place])   . Depression    controlled;   Marland Kitchen Diabetes mellitus without complication ([place])    not controlled, taking insulin but sugar continues to run high;   . Edema   . Esophageal varices ([place])   . GAVE (gastric antral vascular ectasia)   . GERD (gastroesophageal reflux disease)   . History of hiatal hernia   . Hyperlipidemia   . Hypertension    controlled well;   . 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 Obesity   . Pancytopenia ([place])   . Polyp, stomach    with chronic blood loss  . Sleep apnea    does not wear a cpap, Medicare would not pay for it   . Venous stasis dermatitis of both lower extremities   . Venous stasis of both lower extremities    with cellulitis    HOSPITAL COURSE:   68 year old male with history of diabetes, liver cirrhosis and chronic kidney disease stage III who presented to the ER due to generalized weakness and speech difficulties. 1.   aphasia: This was due to dehydration.  Work-up for CVA was essentially unremarkable including MRI and carotid Doppler.  Patient has no neurological deficits at discharge.  2.  Acute on  chronic kidney disease stage III: Lasix and Aldactone have been discontinued for now.  These should be restarted as outpatient once creatinine is closer to baseline.  His creatinine has improved.  Baseline creatinine is 1.3-1.4.  3.  Diabetes: Patient will continue ADA diet and outpatient regimen  4.  Anemia of chronic disease: Patient will continue ferrous sulfate   5.  Liver cirrhosis with low platelets at baseline: Patient will continue on Xifaxan and lactulose.  6.   Hypokalemia: This was repleted  DISCHARGE CONDITIONS AND DIET:   Stable for discharge on diabetic diet  CONSULTS OBTAINED:  Treatment Team:  Aroor, Lanice Schwab, MD  DRUG ALLERGIES:  No Known Allergies  DISCHARGE MEDICATIONS:   Allergies as of 01/15/2019   No Known Allergies     Medication List    STOP taking these medications   furosemide 40 MG tablet Commonly known as:  LASIX   metolazone 5 MG tablet Commonly known as:  ZAROXOLYN   vancomycin 125 MG capsule Commonly known as:  VANCOCIN     TAKE these medications   Delsym 30 MG/5ML liquid Generic drug:  dextromethorphan Take 30 mg by mouth 2 (two) times daily.   famotidine 20 MG tablet Commonly known as:  PEPCID Take 20 mg by mouth 2 (two) times daily.   feeding supplement (PRO-STAT SUGAR FREE  64) Liqd Take 30 mLs by mouth 2 (two) times daily with a meal.   ferrous sulfate 325 (65 FE) MG tablet Take 325 mg by mouth 2 (two) times daily with a meal.   HYDROcodone-acetaminophen 5-325 MG tablet Commonly known as:  NORCO/VICODIN Take 1 tablet by mouth every 6 (six) hours as needed for severe pain.   insulin aspart protamine - aspart (70-30) 100 UNIT/ML FlexPen Commonly known as:  NOVOLOG 70/30 MIX Inject 85 Units into the skin daily with breakfast.   insulin aspart protamine - aspart (70-30) 100 UNIT/ML FlexPen Commonly known as:  NOVOLOG 70/30 MIX Inject 72 Units into the skin at bedtime.   lactulose 10 GM/15ML solution Commonly known as:   CHRONULAC   loperamide 2 MG capsule Commonly known as:  IMODIUM Take 2 mg by mouth 3 (three) times daily.   LORazepam 0.5 MG tablet Commonly known as:  ATIVAN Take 1 tablet (0.5 mg total) by mouth every 8 (eight) hours as needed for anxiety.   multivitamin tablet Take 1 tablet by mouth daily.   omeprazole 40 MG capsule Commonly known as:  PRILOSEC Take 40 mg by mouth daily.   Potassium Chloride ER 20 MEQ Tbcr Take 20 mEq by mouth 2 (two) times daily. While on lasix'   rifaximin 550 MG Tabs tablet Commonly known as:  Xifaxan Take 1 tablet (550 mg total) by mouth 2 (two) times daily.   traMADol 50 MG tablet Commonly known as:  ULTRAM Take 50 mg by mouth 3 (three) times daily as needed.   UltiCare Mini Pen Needles 31G X 6 MM Misc Generic drug:  Insulin Pen Needle   vitamin C 250 MG tablet Commonly known as:  ASCORBIC ACID Take 500 mg by mouth 2 (two) times daily.   zinc sulfate 220 (50 Zn) MG capsule Take 220 mg by mouth daily.         Today   CHIEF COMPLAINT:  Patient is doing well.  No neurologic deficits.  It appears that patient was dehydrated causing his symptoms of aphasia.   VITAL SIGNS:  Blood pressure (!) 143/52, pulse 74, temperature 98.1 F (36.7 C), temperature source Oral, resp. rate 18, height 5\' 11"  (1.803 m), weight 122.5 kg, SpO2 98 %.   REVIEW OF SYSTEMS:  Review of Systems  Constitutional: Negative.  Negative for chills, fever and malaise/fatigue.  HENT: Negative.  Negative for ear discharge, ear pain, hearing loss, nosebleeds and sore throat.   Eyes: Negative.  Negative for blurred vision and pain.  Respiratory: Negative.  Negative for cough, hemoptysis, shortness of breath and wheezing.   Cardiovascular: Negative.  Negative for chest pain, palpitations and leg swelling.  Gastrointestinal: Negative.  Negative for abdominal pain, blood in stool, diarrhea, nausea and vomiting.  Genitourinary: Negative.  Negative for dysuria.   Musculoskeletal: Negative.  Negative for back pain.  Skin: Negative.   Neurological: Negative for dizziness, tremors, speech change, focal weakness, seizures and headaches.  Endo/Heme/Allergies: Negative.  Does not bruise/bleed easily.  Psychiatric/Behavioral: Negative.  Negative for depression, hallucinations and suicidal ideas.     PHYSICAL EXAMINATION:  GENERAL:  68 y.o.-year-old patient lying in the bed with no acute distress.  obese NECK:  Supple, no jugular venous distention. No thyroid enlargement, no tenderness.  LUNGS: Normal breath sounds bilaterally, no wheezing, rales,rhonchi  No use of accessory muscles of respiration.  CARDIOVASCULAR: S1, S2 normal. No murmurs, rubs, or gallops.  ABDOMEN: Soft, non-tender, non-distended. Bowel sounds present. No organomegaly or mass.  EXTREMITIES:  No pedal edema, cyanosis, or clubbing.  PSYCHIATRIC: The patient is alert and oriented x 3.  SKIN: No obvious rash, lesion, or ulcer.   DATA REVIEW:   CBC Recent Labs  Lab 01/15/19 0341  WBC 5.3  HGB 8.2*  HCT 25.0*  PLT 112*    Chemistries  Recent Labs  Lab 01/14/19 1835 01/15/19 0341  NA 138  --   K 3.9  --   CL 99  --   CO2 25  --   GLUCOSE 201*  --   BUN 44*  --   CREATININE 2.08* 1.94*  CALCIUM 9.0  --     Cardiac Enzymes No results for input(s): TROPONINI in the last 168 hours.  Microbiology Results  @[email or web address] @  RADIOLOGY:  Dg Chest 2 View  Result Date: 01/14/2019 CLINICAL DATA:  Weakness EXAM: CHEST - 2 VIEW COMPARISON:  None. FINDINGS: Mild cardiomegaly. Both lungs are clear. The visualized skeletal structures are unremarkable. IMPRESSION: No active cardiopulmonary disease. Electronically Signed   By: Ulyses Jarred M.D.   On: 01/14/2019 19:17   Ct Head Wo Contrast  Result Date: 01/14/2019 CLINICAL DATA:  68 y/o  M; infusion and intermittent speech issues. EXAM: CT HEAD WITHOUT CONTRAST TECHNIQUE: Contiguous axial images were obtained from the base of the  skull through the vertex without intravenous contrast. COMPARISON:  12/31/2017 CT head FINDINGS: Brain: No evidence of acute infarction, hemorrhage, hydrocephalus, extra-axial collection or mass lesion/mass effect. Stable nonspecific white matter hypodensities compatible with chronic microvascular ischemic changes and stable volume loss of brain. Vascular: Calcific atherosclerosis of carotid siphons. No hyperdense vessel identified. Skull: Normal. Negative for fracture or focal lesion. Sinuses/Orbits: No acute finding. Other: 15 mm suboccipital dermal cyst. IMPRESSION: 1. No acute intracranial abnormality identified. 2. Stable chronic microvascular ischemic changes and volume loss of the brain. Electronically Signed   By: Kristine Garbe M.D.   On: 01/14/2019 19:15   Mr Brain Wo Contrast  Result Date: 01/15/2019 CLINICAL DATA:  Focal neuro deficit for greater than 6 hours. Intermittent speech abnormality. EXAM: MRI HEAD WITHOUT CONTRAST MRA HEAD WITHOUT CONTRAST TECHNIQUE: Multiplanar, multiecho pulse sequences of the brain and surrounding structures were obtained without intravenous contrast. Angiographic images of the head were obtained using MRA technique without contrast. COMPARISON:  CT head without contrast 01/14/2019 FINDINGS: MRI HEAD FINDINGS Brain: The diffusion-weighted images demonstrate no acute or subacute infarction. There is no hemorrhage or mass lesion. Mild atrophy is likely within normal limits for age. No significant white matter disease is present. The ventricles are of normal size. No significant extraaxial fluid collection is present. The internal auditory canals are within normal limits. The brainstem and cerebellum are within normal limits. Vascular: Normal flow voids. Skull and upper cervical spine: The craniocervical junction is normal. Upper cervical spine is within normal limits. Marrow signal is unremarkable. Sinuses/Orbits: The paranasal sinuses and mastoid air cells are  clear. The globes and orbits are within normal limits. Other: A sebaceous cyst is present posteriorly in the midline posterior neck. MRA HEAD FINDINGS Internal carotid arteries are within normal limits from the high cervical segments through the ICA termini bilaterally. The right A1 segment is dominant. The anterior communicating artery is patent. MCA bifurcations are within normal limits bilaterally. There is some attenuation of distal small vessels without a significant proximal stenosis or obstruction. No aneurysm is present. The right vertebral artery is the dominant vessel. Right PICA origin is visualized and normal. The left AICA is dominant. The basilar artery is normal.  The right posterior cerebral artery originates from the basilar tip. The left posterior cerebral artery is of fetal type. There is some attenuation of distal PCA branch vessels. IMPRESSION: 1. Normal MRI appearance of the brain. No acute or focal lesion to explain the patient's speech abnormalities. 2. Mild distal small vessel disease evident in the MRA circle-of-Willis without significant proximal stenosis, aneurysm, or branch vessel occlusion. Electronically Signed   By: San Morelle M.D.   On: 01/15/2019 04:24   US Carotid Bilateral (at [facility] Only)  Result Date: 01/15/2019 CLINICAL DATA:  68 year old male with aphasia EXAM: BILATERAL CAROTID DUPLEX ULTRASOUND TECHNIQUE: Pearline Cables scale imaging, color Doppler and duplex ultrasound were performed of bilateral carotid and vertebral arteries in the neck. COMPARISON:  None. FINDINGS: Criteria: Quantification of carotid stenosis is based on velocity parameters that correlate the residual internal carotid diameter with NASCET-based stenosis levels, using the diameter of the distal internal carotid lumen as the denominator for stenosis measurement. The following velocity measurements were obtained: RIGHT ICA:  Systolic 676 cm/sec, Diastolic 23 cm/sec CCA:  720 cm/sec SYSTOLIC ICA/CCA  RATIO:  1.3 ECA:  212 cm/sec LEFT ICA:  Systolic 947 cm/sec, Diastolic 19 cm/sec CCA:  096 cm/sec SYSTOLIC ICA/CCA RATIO:  1.0 ECA:  164 cm/sec Right Brachial SBP: Not acquired Left Brachial SBP: Not acquired RIGHT CAROTID ARTERY: No significant calcifications of the right common carotid artery. Intermediate waveform maintained. Heterogeneous and partially calcified plaque at the right carotid bifurcation. No significant lumen shadowing. Low resistance waveform of the right ICA. No significant tortuosity. RIGHT VERTEBRAL ARTERY: Antegrade flow with low resistance waveform. LEFT CAROTID ARTERY: No significant calcifications of the left common carotid artery. Intermediate waveform maintained. Heterogeneous and partially calcified plaque at the left carotid bifurcation without significant lumen shadowing. Low resistance waveform of the left ICA. No significant tortuosity. LEFT VERTEBRAL ARTERY:  Antegrade flow with low resistance waveform. IMPRESSION: Color duplex indicates minimal heterogeneous and calcified plaque, with no hemodynamically significant stenosis by duplex criteria in the extracranial cerebrovascular circulation. Signed, Dulcy Fanny. Dellia Nims, RPVI [facility] Radiology Electronically Signed   By: Corrie Mckusick D.O.   On: 01/15/2019 09:29   Mr Jodene Nam Head/brain GE Cm  Result Date: 01/15/2019 CLINICAL DATA:  Focal neuro deficit for greater than 6 hours. Intermittent speech abnormality. EXAM: MRI HEAD WITHOUT CONTRAST MRA HEAD WITHOUT CONTRAST TECHNIQUE: Multiplanar, multiecho pulse sequences of the brain and surrounding structures were obtained without intravenous contrast. Angiographic images of the head were obtained using MRA technique without contrast. COMPARISON:  CT head without contrast 01/14/2019 FINDINGS: MRI HEAD FINDINGS Brain: The diffusion-weighted images demonstrate no acute or subacute infarction. There is no hemorrhage or mass lesion. Mild  atrophy is likely within normal limits for age. No significant white matter disease is present. The ventricles are of normal size. No significant extraaxial fluid collection is present. The internal auditory canals are within normal limits. The brainstem and cerebellum are within normal limits. Vascular: Normal flow voids. Skull and upper cervical spine: The craniocervical junction is normal. Upper cervical spine is within normal limits. Marrow signal is unremarkable. Sinuses/Orbits: The paranasal sinuses and mastoid air cells are clear. The globes and orbits are within normal limits. Other: A sebaceous cyst is present posteriorly in the midline posterior neck. MRA HEAD FINDINGS Internal carotid arteries are within normal limits from the high cervical segments through the ICA termini bilaterally. The right A1 segment is dominant. The anterior communicating artery is patent. MCA bifurcations are  within normal limits bilaterally. There is some attenuation of distal small vessels without a significant proximal stenosis or obstruction. No aneurysm is present. The right vertebral artery is the dominant vessel. Right PICA origin is visualized and normal. The left AICA is dominant. The basilar artery is normal. The right posterior cerebral artery originates from the basilar tip. The left posterior cerebral artery is of fetal type. There is some attenuation of distal PCA branch vessels. IMPRESSION: 1. Normal MRI appearance of the brain. No acute or focal lesion to explain the patient's speech abnormalities. 2. Mild distal small vessel disease evident in the MRA circle-of-Willis without significant proximal stenosis, aneurysm, or branch vessel occlusion. Electronically Signed   By: San Morelle M.D.   On: 01/15/2019 04:24      Allergies as of 01/15/2019   No Known Allergies     Medication List    STOP taking these medications   furosemide 40 MG tablet Commonly known as:  LASIX   metolazone 5 MG  tablet Commonly known as:  ZAROXOLYN   vancomycin 125 MG capsule Commonly known as:  VANCOCIN     TAKE these medications   Delsym 30 MG/5ML liquid Generic drug:  dextromethorphan Take 30 mg by mouth 2 (two) times daily.   famotidine 20 MG tablet Commonly known as:  PEPCID Take 20 mg by mouth 2 (two) times daily.   feeding supplement (PRO-STAT SUGAR FREE 64) Liqd Take 30 mLs by mouth 2 (two) times daily with a meal.   ferrous sulfate 325 (65 FE) MG tablet Take 325 mg by mouth 2 (two) times daily with a meal.   HYDROcodone-acetaminophen 5-325 MG tablet Commonly known as:  NORCO/VICODIN Take 1 tablet by mouth every 6 (six) hours as needed for severe pain.   insulin aspart protamine - aspart (70-30) 100 UNIT/ML FlexPen Commonly known as:  NOVOLOG 70/30 MIX Inject 85 Units into the skin daily with breakfast.   insulin aspart protamine - aspart (70-30) 100 UNIT/ML FlexPen Commonly known as:  NOVOLOG 70/30 MIX Inject 72 Units into the skin at bedtime.   lactulose 10 GM/15ML solution Commonly known as:  CHRONULAC   loperamide 2 MG capsule Commonly known as:  IMODIUM Take 2 mg by mouth 3 (three) times daily.   LORazepam 0.5 MG tablet Commonly known as:  ATIVAN Take 1 tablet (0.5 mg total) by mouth every 8 (eight) hours as needed for anxiety.   multivitamin tablet Take 1 tablet by mouth daily.   omeprazole 40 MG capsule Commonly known as:  PRILOSEC Take 40 mg by mouth daily.   Potassium Chloride ER 20 MEQ Tbcr Take 20 mEq by mouth 2 (two) times daily. While on lasix'   rifaximin 550 MG Tabs tablet Commonly known as:  Xifaxan Take 1 tablet (550 mg total) by mouth 2 (two) times daily.   traMADol 50 MG tablet Commonly known as:  ULTRAM Take 50 mg by mouth 3 (three) times daily as needed.   UltiCare Mini Pen Needles 31G X 6 MM Misc Generic drug:  Insulin Pen Needle   vitamin C 250 MG tablet Commonly known as:  ASCORBIC ACID Take 500 mg by mouth 2 (two) times  daily.   zinc sulfate 220 (50 Zn) MG capsule Take 220 mg by mouth daily.         Management plans discussed with the patient and he is in agreement. Stable for discharge home  Patient should follow up with pcp  CODE STATUS:     Code  Status Orders  (From admission, onward)         Start     Ordered   01/15/19 0047  Full code  Continuous     01/15/19 0046        Code Status History    Date Active Date Inactive Code Status Order ID Comments User Context   12/02/2018 1713 12/09/2018 1737 Full Code [id number]  Hillary Bow, MD ED   10/16/2018 0054 10/18/2018 1842 Full Code [id number]  [name], MD Inpatient   07/18/2018 2339 07/21/2018 1356 Full Code [id number]  Lance Coon, MD Inpatient   06/18/2018 1609 06/19/2018 1717 Full Code [id number]  Epifanio Lesches, MD ED   12/31/2017 1856 01/01/2018 1205 Full Code [id number]  Saundra Shelling, MD Inpatient   12/25/2017 1456 12/26/2017 1618 Full Code [id number]  Epifanio Lesches, MD ED   12/16/2017 0921 12/18/2017 1635 DNR [id number]  Max Sane, MD Inpatient   12/16/2017 0305 12/16/2017 0921 Full Code [id number]  Salary, Avel Peace, MD ED   12/02/2017 2003 12/05/2017 2000 Full Code [id number]  Saundra Shelling, MD Inpatient   11/21/2017 1948 11/23/2017 1958 Full Code [id number]  Loletha Grayer, MD ED   10/23/2017 1405 10/26/2017 1402 Full Code [id number]  Bettey Costa, MD Inpatient   10/18/2017 0058 10/19/2017 1806 Full Code [id number]  Saundra Shelling, MD Inpatient   10/09/2017 2139 10/11/2017 1806 Full Code [id number]  [name], MD Inpatient   09/03/2017 2124 09/05/2017 1552 Full Code [id number]  Nicholes Mango, MD Inpatient   06/26/2017 1119 06/28/2017 1748 Full Code [id number]  Henreitta Leber, MD Inpatient   02/21/2017 2007 02/22/2017 1803 Full Code [id number]  [name], MD Inpatient   07/20/2015 1245 07/22/2015 2217 Full Code [id number]  Nicholes Mango, MD Inpatient   02/08/2015 2331 02/11/2015 1943 Full Code [id number]  Hower,  Aaron Mose, MD ED      TOTAL TIME TAKING CARE OF THIS PATIENT: 38 minutes.    Note: This dictation was prepared with Dragon dictation along with smaller phrase technology. Any transcriptional errors that result from this process are unintentional.  Bettey Costa M.D on 01/15/2019 at 9:54 AM  Between 7am to 6pm - Pager - [phone] After 6pm go to [email or web address] - password [email or web address] [facility]  Office  [phone]  CC: Primary care physician; Baxter Hire, MD

## 2019-01-15 NOTE — Evaluation (Signed)
Physical Therapy Evaluation Patient Details Name: Corey West MRN: [id number] DOB: 01-Jun-1951 Today's Date: 01/15/2019   History of Present Illness  Pt is a 68 y.o. male presenting to hospital 01/14/19 with AMS, weak and jittery, and trouble talking (transient aphasia).  Pt admitted to r/o stroke/TIA.  CT of head and MRI of brain negative for acute intracranial abnormalities.  PMH includes B ankle fx's, anemia, AVM, CKD, DM, h/o hiatal hernia, htn, c-diff, nephropathy, and alcoholic cirrhosis.  Clinical Impression  Prior to hospital admission, pt reports being modified independent ambulating with SPC (uses RW if he feels he needs it).  Pt lives alone in 1 level home with 3-4 steps to enter with R railing.  Currently pt is modified independent with bed mobility, transfers, and ambulating up to 160 feet with RW (pt requesting to use RW for safety); pt declined to trial stairs and reports he will have someone assist him as needed upon discharge home.  Overall pt steady and safe with functional mobility using RW and toileting in bathroom.  Pt reports no need for HHPT (pt preferring to use RW at this time but appears close to baseline).  Pt reports plan for discharge home today.  Will complete current PT order; please re-consult PT if pt's status changes and acute PT needs are identified.    Follow Up Recommendations No PT follow up    Equipment Recommendations  Rolling walker with 5" wheels(pt has own RW at home)    Recommendations for Other Services       Precautions / Restrictions Precautions Precautions: Fall Restrictions Other Position/Activity Restrictions: Per chart review pt FWB'ing B LE's (h/o B ankle fx's); pt reports he has not worn any walking boots for about a month      Mobility  Bed Mobility Overal bed mobility: Modified Independent             General bed mobility comments: Semi-supine to/from sit with mild increased effort but no assist required.  Transfers Overall  transfer level: Modified independent Equipment used: Rolling walker (2 wheeled)             General transfer comment: mild increased effort to stand with RW but steady and safe  Ambulation/Gait Ambulation/Gait assistance: Modified independent (Device/Increase time) Gait Distance (Feet): (20 feet to bathroom; 160 feet) Assistive device: Rolling walker (2 wheeled) Gait Pattern/deviations: Step-through pattern Gait velocity: minimally decreased   General Gait Details: steady safe ambulation using RW  Stairs  Pt declined to trial stairs.          Wheelchair Mobility    Modified Rankin (Stroke Patients Only)       Balance Overall balance assessment: Needs assistance Sitting-balance support: No upper extremity supported;Feet supported Sitting balance-Leahy Scale: Normal Sitting balance - Comments: steady sitting reaching outside BOS   Standing balance support: No upper extremity supported Standing balance-Leahy Scale: Good Standing balance comment: steady standing reaching within BOS                             Pertinent Vitals/Pain Pain Assessment: No/denies pain  HR WFL during session's activities.    Home Living Family/patient expects to be discharged to:: Private residence Living Arrangements: Alone Available Help at Discharge: Family(niece lives nearby; neighbor assists as needed) Type of Home: Apartment(condo) Home Access: Stairs to enter Entrance Stairs-Rails: Right Entrance Stairs-Number of Steps: 3-4 stairs (uses R railing)  Home Layout: One level Home Equipment: Environmental consultant - 2 wheels;Cane -  single point      Prior Function Level of Independence: Independent with assistive device(s)         Comments: Ambulates with SPC (pt reports being at [facility] last week walking and has not worn any walking boots for about a month and reports no issues); will use RW if feels like he needs it.  No recent falls.  Neighbor assists as needed.  Niece also  assists as needed and gets pt's groceries.     Hand Dominance        Extremity/Trunk Assessment   Upper Extremity Assessment Upper Extremity Assessment: Overall WFL for tasks assessed    Lower Extremity Assessment Lower Extremity Assessment: Overall WFL for tasks assessed    Cervical / Trunk Assessment Cervical / Trunk Assessment: Normal  Communication   Communication: No difficulties  Cognition Arousal/Alertness: Awake/alert Behavior During Therapy: WFL for tasks assessed/performed Overall Cognitive Status: Within Functional Limits for tasks assessed                                        General Comments   Nursing cleared pt for participation in physical therapy.  Pt agreeable to PT session.    Exercises     Assessment/Plan    PT Assessment Patent does not need any further PT services  PT Problem List         PT Treatment Interventions      PT Goals (Current goals can be found in the Care Plan section)  Acute Rehab PT Goals Patient Stated Goal: to go home soon PT Goal Formulation: With patient Time For Goal Achievement: 01/29/19 Potential to Achieve Goals: Good    Frequency     Barriers to discharge        Co-evaluation               AM-PAC PT "6 Clicks" Mobility  Outcome Measure Help needed turning from your back to your side while in a flat bed without using bedrails?: None Help needed moving from lying on your back to sitting on the side of a flat bed without using bedrails?: None Help needed moving to and from a bed to a chair (including a wheelchair)?: None Help needed standing up from a chair using your arms (e.g., wheelchair or bedside chair)?: None Help needed to walk in hospital room?: None Help needed climbing 3-5 steps with a railing? : A Little 6 Click Score: 23    End of Session Equipment Utilized During Treatment: Gait belt Activity Tolerance: Patient tolerated treatment well Patient left: in bed;with call  bell/phone within reach;with bed alarm set Nurse Communication: Mobility status;Precautions;Weight bearing status PT Visit Diagnosis: Muscle weakness (generalized) (M62.81)    Time: 2197-5883 PT Time Calculation (min) (ACUTE ONLY): 38 min   Charges:   PT Evaluation $PT Eval Low Complexity: 1 Low         Twala Collings, PT 01/15/19, 11:25 AM [phone]

## 2019-01-15 NOTE — Progress Notes (Signed)
OT Cancellation Note  Patient Details Name: Corey West MRN: [id number] DOB: [birth year]   Cancelled Treatment:    Reason Eval/Treat Not Completed: OT screened, no needs identified, will sign off. Pt denies impairments and no functional deficits identified. Pt eager to return home. No skilled OT needs. Please re-consult if additional needs arise.   Jeni Salles, MPH, MS, OTR/L ascom [phone] 01/15/19, 10:57 AM

## 2019-01-15 NOTE — TOC Transition Note (Signed)
Transition of Care [facility]) - CM/SW Discharge Note   Patient Details  Name: Corey West MRN: [id number] Date of Birth: Oct 18, 1950  [facility] CM/SW Contact:  Su Hilt, RN Phone Number: 01/15/2019, 10:47 AM   Clinical Narrative:     Patient is to DC home with self care, denies the need for any assistance of any kind   Final next level of care: Home/Self Care Barriers to Discharge: Barriers Resolved   Patient Goals and CMS Choice Patient states their goals for this hospitalization and ongoing recovery are:: go home CMS Medicare.gov Compare Post Acute Care list provided to:: (declines)    Discharge Placement                       Discharge Plan and Services   Discharge Planning Services: CM Consult            DME Arranged: (denies need)   HH Arranged: (refuses)     Social Determinants of Health (SDOH) Interventions     Readmission Risk Interventions Readmission Risk Prevention Plan 01/15/2019  Transportation Screening Complete  HRI or Home Care Consult Complete  Medication Review Press photographer) Complete  Some recent data might be hidden

## 2019-01-20 ENCOUNTER — Other Ambulatory Visit: Payer: Self-pay

## 2019-01-20 DIAGNOSIS — D696 Thrombocytopenia, unspecified: Secondary | ICD-10-CM

## 2019-01-22 ENCOUNTER — Other Ambulatory Visit: Payer: Self-pay

## 2019-01-23 ENCOUNTER — Encounter: Payer: Self-pay | Admitting: Oncology

## 2019-01-23 ENCOUNTER — Inpatient Hospital Stay: Payer: Medicare Other | Attending: Oncology

## 2019-01-23 ENCOUNTER — Other Ambulatory Visit: Payer: Self-pay

## 2019-01-23 ENCOUNTER — Inpatient Hospital Stay: Payer: Medicare Other

## 2019-01-23 ENCOUNTER — Inpatient Hospital Stay (HOSPITAL_BASED_OUTPATIENT_CLINIC_OR_DEPARTMENT_OTHER): Payer: Medicare Other | Admitting: Oncology

## 2019-01-23 VITALS — BP 147/67 | HR 73 | Temp 98.2°F | Wt 277.0 lb

## 2019-01-23 DIAGNOSIS — N189 Chronic kidney disease, unspecified: Secondary | ICD-10-CM

## 2019-01-23 DIAGNOSIS — D500 Iron deficiency anemia secondary to blood loss (chronic): Secondary | ICD-10-CM

## 2019-01-23 DIAGNOSIS — D696 Thrombocytopenia, unspecified: Secondary | ICD-10-CM

## 2019-01-23 DIAGNOSIS — D649 Anemia, unspecified: Secondary | ICD-10-CM

## 2019-01-23 DIAGNOSIS — D631 Anemia in chronic kidney disease: Secondary | ICD-10-CM | POA: Insufficient documentation

## 2019-01-23 LAB — IRON AND TIBC
Iron: 158 ug/dL (ref 45–182)
Saturation Ratios: 43 % — ABNORMAL HIGH (ref 17.9–39.5)
TIBC: 372 ug/dL (ref 250–450)
UIBC: 214 ug/dL

## 2019-01-23 LAB — CBC WITH DIFFERENTIAL/PLATELET
Abs Immature Granulocytes: 0.03 10*3/uL (ref 0.00–0.07)
Basophils Absolute: 0 10*3/uL (ref 0.0–0.1)
Basophils Relative: 1 %
Eosinophils Absolute: 0.1 10*3/uL (ref 0.0–0.5)
Eosinophils Relative: 1 %
HCT: 26.3 % — ABNORMAL LOW (ref 39.0–52.0)
Hemoglobin: 8.9 g/dL — ABNORMAL LOW (ref 13.0–17.0)
Immature Granulocytes: 1 %
Lymphocytes Relative: 14 %
Lymphs Abs: 0.7 10*3/uL (ref 0.7–4.0)
MCH: 29.9 pg (ref 26.0–34.0)
MCHC: 33.8 g/dL (ref 30.0–36.0)
MCV: 88.3 fL (ref 80.0–100.0)
Monocytes Absolute: 0.4 10*3/uL (ref 0.1–1.0)
Monocytes Relative: 7 %
Neutro Abs: 3.8 10*3/uL (ref 1.7–7.7)
Neutrophils Relative %: 76 %
Platelets: 110 10*3/uL — ABNORMAL LOW (ref 150–400)
RBC: 2.98 MIL/uL — ABNORMAL LOW (ref 4.22–5.81)
RDW: 15.4 % (ref 11.5–15.5)
WBC: 4.9 10*3/uL (ref 4.0–10.5)
nRBC: 0 % (ref 0.0–0.2)

## 2019-01-23 LAB — FERRITIN: Ferritin: 47 ng/mL (ref 24–336)

## 2019-01-23 MED ORDER — EPOETIN ALFA 10000 UNIT/ML IJ SOLN
10000.0000 [IU] | Freq: Once | INTRAMUSCULAR | Status: AC
  Administered 2019-01-23: 13:00:00 10000 [IU] via SUBCUTANEOUS
  Filled 2019-01-23: qty 2

## 2019-01-23 NOTE — Progress Notes (Signed)
[place]  [phone] Fax:([phone]) [phone]  ID: Corey West OB: 01-06-1951  MR#: [id number]  FIE#:[id number]  Patient Care Team: Baxter Hire, MD as PCP - General (Internal Medicine) Baxter Hire, MD (Internal Medicine)  CHIEF COMPLAINT: Iron deficiency anemia, thrombocytopenia  INTERVAL HISTORY: Patient returns to clinic today for repeat laboratory work, further evaluation, and initiation of Procrit.  Over the past several months he was hospitalized with bilateral broken ankles and spent extensive time in rehabilitation.  Patient states he still has significant weakness and fatigue, but this is improving. He has no neurologic complaints.  He denies any recent fevers.  He has a fair appetite, but denies weight loss.  He denies any chest pain, shortness of breath, cough, or hemoptysis. He denies any nausea, vomiting, constipation, or diarrhea.  He has no melena or hematochezia. He has no urinary complaints.  Patient offers no further specific complaints today.  REVIEW OF SYSTEMS:   Review of Systems  Constitutional: Positive for malaise/fatigue. Negative for fever and weight loss.  Respiratory: Negative.  Negative for cough, hemoptysis and shortness of breath.   Cardiovascular: Negative.  Negative for chest pain and leg swelling.  Gastrointestinal: Negative.  Negative for abdominal pain, blood in stool and melena.  Genitourinary: Negative.  Negative for hematuria.  Musculoskeletal: Negative.  Negative for back pain.  Skin: Negative.  Negative for rash.  Neurological: Positive for weakness. Negative for sensory change, focal weakness and headaches.  Psychiatric/Behavioral: Negative.  The patient is not nervous/anxious.     As per HPI. Otherwise, a complete review of systems is negative.  PAST MEDICAL HISTORY: Past Medical History:  Diagnosis Date   Anemia    Anxiety    controlled;    Arthritis    AVM (arteriovenous malformation) of  stomach, acquired with hemorrhage    Barrett's esophagus    Chronic kidney disease    renal infufficiency   Cirrhosis (HCC)    Depression    controlled;    Diabetes mellitus without complication (HCC)    not controlled, taking insulin but sugar continues to run high;    Edema    Esophageal varices (HCC)    GAVE (gastric antral vascular ectasia)    GERD (gastroesophageal reflux disease)    History of hiatal hernia    Hyperlipidemia    Hypertension    controlled well;    Nephropathy, 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[place])    Obesity    Pancytopenia (HCC)    Polyp, stomach    with chronic blood loss   Sleep apnea    does not wear a cpap, Medicare would not pay for it    Venous stasis dermatitis of both lower extremities    Venous stasis of both lower extremities    with cellulitis    PAST SURGICAL HISTORY: Past Surgical History:  Procedure Laterality Date   ESOPHAGOGASTRODUODENOSCOPY N/A 02/09/2015   Procedure: ESOPHAGOGASTRODUODENOSCOPY (EGD);  Surgeon: Manya Silvas, MD;  Location: [facility] ENDOSCOPY;  Service: Endoscopy;  Laterality: N/A;   ESOPHAGOGASTRODUODENOSCOPY N/A 07/22/2015   Procedure: ESOPHAGOGASTRODUODENOSCOPY (EGD);  Surgeon: Manya Silvas, MD;  Location: [facility] ENDOSCOPY;  Service: Endoscopy;  Laterality: N/A;   ESOPHAGOGASTRODUODENOSCOPY N/A 06/26/2017   Procedure: ESOPHAGOGASTRODUODENOSCOPY (EGD);  Surgeon: Manya Silvas, MD;  Location: [facility] ENDOSCOPY;  Service: Endoscopy;  Laterality: N/A;   ESOPHAGOGASTRODUODENOSCOPY N/A 06/27/2017   Procedure: ESOPHAGOGASTRODUODENOSCOPY (EGD);  Surgeon: Manya Silvas, MD;  Location: [facility] ENDOSCOPY;  Service: Endoscopy;  Laterality: N/A;   ESOPHAGOGASTRODUODENOSCOPY N/A 06/28/2017  Procedure: ESOPHAGOGASTRODUODENOSCOPY (EGD);  Surgeon: Manya Silvas, MD;  Location: [facility] ENDOSCOPY;  Service: Endoscopy;  Laterality: N/A;   ESOPHAGOGASTRODUODENOSCOPY Bilateral 10/11/2017   Procedure: ESOPHAGOGASTRODUODENOSCOPY (EGD);   Surgeon: Manya Silvas, MD;  Location: [facility] ENDOSCOPY;  Service: Endoscopy;  Laterality: Bilateral;   ESOPHAGOGASTRODUODENOSCOPY N/A 12/04/2017   Procedure: ESOPHAGOGASTRODUODENOSCOPY (EGD);  Surgeon: Manya Silvas, MD;  Location: [facility] ENDOSCOPY;  Service: Endoscopy;  Laterality: N/A;   ESOPHAGOGASTRODUODENOSCOPY (EGD) WITH PROPOFOL N/A 07/20/2015   Procedure: ESOPHAGOGASTRODUODENOSCOPY (EGD) WITH PROPOFOL;  Surgeon: Manya Silvas, MD;  Location: [facility] ENDOSCOPY;  Service: Endoscopy;  Laterality: N/A;   ESOPHAGOGASTRODUODENOSCOPY (EGD) WITH PROPOFOL N/A 09/16/2015   Procedure: ESOPHAGOGASTRODUODENOSCOPY (EGD) WITH PROPOFOL;  Surgeon: Manya Silvas, MD;  Location: [facility] ENDOSCOPY;  Service: Endoscopy;  Laterality: N/A;   ESOPHAGOGASTRODUODENOSCOPY (EGD) WITH PROPOFOL N/A 03/16/2016   Procedure: ESOPHAGOGASTRODUODENOSCOPY (EGD) WITH PROPOFOL;  Surgeon: Manya Silvas, MD;  Location: [facility] ENDOSCOPY;  Service: Endoscopy;  Laterality: N/A;   ESOPHAGOGASTRODUODENOSCOPY (EGD) WITH PROPOFOL N/A 09/14/2016   Procedure: ESOPHAGOGASTRODUODENOSCOPY (EGD) WITH PROPOFOL;  Surgeon: Manya Silvas, MD;  Location: [facility] ENDOSCOPY;  Service: Endoscopy;  Laterality: N/A;   ESOPHAGOGASTRODUODENOSCOPY (EGD) WITH PROPOFOL N/A 11/05/2016   Procedure: ESOPHAGOGASTRODUODENOSCOPY (EGD) WITH PROPOFOL;  Surgeon: Manya Silvas, MD;  Location: [facility] ENDOSCOPY;  Service: Endoscopy;  Laterality: N/A;   ESOPHAGOGASTRODUODENOSCOPY (EGD) WITH PROPOFOL N/A 02/06/2017   Procedure: ESOPHAGOGASTRODUODENOSCOPY (EGD) WITH PROPOFOL;  Surgeon: Manya Silvas, MD;  Location: [facility] ENDOSCOPY;  Service: Endoscopy;  Laterality: N/A;   ESOPHAGOGASTRODUODENOSCOPY (EGD) WITH PROPOFOL N/A 04/24/2017   Procedure: ESOPHAGOGASTRODUODENOSCOPY (EGD) WITH PROPOFOL;  Surgeon: Manya Silvas, MD;  Location: [facility] ENDOSCOPY;  Service: Endoscopy;  Laterality: N/A;   ESOPHAGOGASTRODUODENOSCOPY (EGD) WITH PROPOFOL N/A 07/31/2017    Procedure: ESOPHAGOGASTRODUODENOSCOPY (EGD) WITH PROPOFOL;  Surgeon: Manya Silvas, MD;  Location: [facility] ENDOSCOPY;  Service: Endoscopy;  Laterality: N/A;   ESOPHAGOGASTRODUODENOSCOPY (EGD) WITH PROPOFOL N/A 08/08/2018   Procedure: ESOPHAGOGASTRODUODENOSCOPY (EGD) WITH PROPOFOL;  Surgeon: Manya Silvas, MD;  Location: [facility] ENDOSCOPY;  Service: Endoscopy;  Laterality: N/A;   GIVENS CAPSULE STUDY N/A 12/05/2017   Procedure: GIVENS CAPSULE STUDY;  Surgeon: Manya Silvas, MD;  Location: [facility] ENDOSCOPY;  Service: Endoscopy;  Laterality: N/A;   TONSILLECTOMY     TONSILLECTOMY AND ADENOIDECTOMY     ULNAR NERVE TRANSPOSITION     UVULOPALATOPHARYNGOPLASTY      FAMILY HISTORY: Family History  Problem Relation Age of Onset   Diabetes Other    Transient ischemic attack Father    CAD Father     ADVANCED DIRECTIVES (Y/N):  N  HEALTH MAINTENANCE: Social History   Tobacco Use   Smoking status: Current Some Day Smoker    Years: 20.00    Types: Cigars   Smokeless tobacco: Never Used  Substance Use Topics   Alcohol use: No    Comment: stopped 15 years ago   Drug use: No     Colonoscopy:  PAP:  Bone density:  Lipid panel:  No Known Allergies  Current Outpatient Medications  Medication Sig Dispense Refill   ferrous sulfate 325 (65 FE) MG tablet Take 325 mg by mouth 2 (two) times daily with a meal.      furosemide (LASIX) 40 MG tablet Take 1 tablet by mouth 2 (two) times a day.     HYDROcodone-acetaminophen (NORCO/VICODIN) 5-325 MG tablet Take 1 tablet by mouth every 6 (six) hours as needed for severe pain. 20 tablet 0   insulin aspart protamine - aspart (NOVOLOG 70/30  MIX) (70-30) 100 UNIT/ML FlexPen Inject 85 Units into the skin daily with breakfast.      insulin aspart protamine - aspart (NOVOLOG 70/30 MIX) (70-30) 100 UNIT/ML FlexPen Inject 72 Units into the skin at bedtime.      lactulose (CHRONULAC) 10 GM/15ML solution      loperamide (IMODIUM) 2 MG  capsule Take 2 mg by mouth 3 (three) times daily.     LORazepam (ATIVAN) 0.5 MG tablet Take 1 tablet (0.5 mg total) by mouth every 8 (eight) hours as needed for anxiety. 20 tablet 0   Multiple Vitamin (MULTIVITAMIN) tablet Take 1 tablet by mouth daily.     omeprazole (PRILOSEC) 40 MG capsule Take 40 mg by mouth daily.     Potassium Chloride ER 20 MEQ TBCR Take 20 mEq by mouth 2 (two) times daily. While on lasix' 30 tablet 1   rifaximin (XIFAXAN) 550 MG TABS tablet Take 1 tablet (550 mg total) by mouth 2 (two) times daily. 60 tablet 0   ULTICARE MINI PEN NEEDLES 31G X 6 MM MISC      vitamin C (ASCORBIC ACID) 250 MG tablet Take 500 mg by mouth 2 (two) times daily.     zinc sulfate 220 (50 Zn) MG capsule Take 220 mg by mouth daily.     dextromethorphan (DELSYM) 30 MG/5ML liquid Take 30 mg by mouth 2 (two) times daily.     famotidine (PEPCID) 20 MG tablet Take 20 mg by mouth 2 (two) times daily.     potassium chloride [place] (K-DUR) 20 MEQ tablet Take 2 tablets by mouth 2 (two) times a day.     ranitidine (ZANTAC) 150 MG tablet Take 1 tablet by mouth 1 day or 1 dose.     traMADol (ULTRAM) 50 MG tablet Take 50 mg by mouth 3 (three) times daily as needed.     No current facility-administered medications for this visit.    Facility-Administered Medications Ordered in Other Visits  Medication Dose Route Frequency Provider Last Rate Last Dose   epoetin alfa (EPOGEN) injection 10,000 Units  10,000 Units Subcutaneous Once Lloyd Huger, MD        OBJECTIVE: Vitals:   01/23/19 1031  BP: (!) 147/67  Pulse: 73  Temp: 98.2 F (36.8 C)     Body mass index is 38.63 kg/m.    ECOG FS:0 - Asymptomatic  General: Well-developed, well-nourished, no acute distress. Eyes: Pink conjunctiva, anicteric sclera. HEENT: Normocephalic, moist mucous membranes. Lungs: Clear to auscultation bilaterally. Heart: Regular rate and rhythm. No rubs, murmurs, or gallops. Abdomen: Soft, nontender,  nondistended. No organomegaly noted, normoactive bowel sounds. Musculoskeletal: No edema, cyanosis, or clubbing. Neuro: Alert, answering all questions appropriately. Cranial nerves grossly intact. Skin: No rashes or petechiae noted. Psych: Normal affect.  LAB RESULTS:  Lab Results  Component Value Date   NA 138 01/14/2019   K 3.9 01/14/2019   CL 99 01/14/2019   CO2 25 01/14/2019   GLUCOSE 201 (H) 01/14/2019   BUN 44 (H) 01/14/2019   CREATININE 1.94 (H) 01/15/2019   CALCIUM 9.0 01/14/2019   PROT 6.0 (L) 12/09/2018   ALBUMIN 2.5 (L) 12/09/2018   AST 39 12/09/2018   ALT 39 12/09/2018   ALKPHOS 68 12/09/2018   BILITOT 0.7 12/09/2018   GFRNONAA 35 (L) 01/15/2019   GFRAA 41 (L) 01/15/2019    Lab Results  Component Value Date   WBC 4.9 01/23/2019   NEUTROABS 3.8 01/23/2019   HGB 8.9 (L) 01/23/2019   HCT  26.3 (L) 01/23/2019   MCV 88.3 01/23/2019   PLT 110 (L) 01/23/2019   Lab Results  Component Value Date   IRON 158 01/23/2019   TIBC 372 01/23/2019   IRONPCTSAT 43 (H) 01/23/2019   Lab Results  Component Value Date   FERRITIN 47 01/23/2019     STUDIES: Dg Chest 2 View  Result Date: 01/14/2019 CLINICAL DATA:  Weakness EXAM: CHEST - 2 VIEW COMPARISON:  None. FINDINGS: Mild cardiomegaly. Both lungs are clear. The visualized skeletal structures are unremarkable. IMPRESSION: No active cardiopulmonary disease. Electronically Signed   By: Ulyses Jarred M.D.   On: 01/14/2019 19:17   Ct Head Wo Contrast  Result Date: 01/14/2019 CLINICAL DATA:  68 y/o  M; infusion and intermittent speech issues. EXAM: CT HEAD WITHOUT CONTRAST TECHNIQUE: Contiguous axial images were obtained from the base of the skull through the vertex without intravenous contrast. COMPARISON:  12/31/2017 CT head FINDINGS: Brain: No evidence of acute infarction, hemorrhage, hydrocephalus, extra-axial collection or mass lesion/mass effect. Stable nonspecific white matter hypodensities compatible with chronic  microvascular ischemic changes and stable volume loss of brain. Vascular: Calcific atherosclerosis of carotid siphons. No hyperdense vessel identified. Skull: Normal. Negative for fracture or focal lesion. Sinuses/Orbits: No acute finding. Other: 15 mm suboccipital dermal cyst. IMPRESSION: 1. No acute intracranial abnormality identified. 2. Stable chronic microvascular ischemic changes and volume loss of the brain. Electronically Signed   By: Kristine Garbe M.D.   On: 01/14/2019 19:15   Mr Brain Wo Contrast  Result Date: 01/15/2019 CLINICAL DATA:  Focal neuro deficit for greater than 6 hours. Intermittent speech abnormality. EXAM: MRI HEAD WITHOUT CONTRAST MRA HEAD WITHOUT CONTRAST TECHNIQUE: Multiplanar, multiecho pulse sequences of the brain and surrounding structures were obtained without intravenous contrast. Angiographic images of the head were obtained using MRA technique without contrast. COMPARISON:  CT head without contrast 01/14/2019 FINDINGS: MRI HEAD FINDINGS Brain: The diffusion-weighted images demonstrate no acute or subacute infarction. There is no hemorrhage or mass lesion. Mild atrophy is likely within normal limits for age. No significant white matter disease is present. The ventricles are of normal size. No significant extraaxial fluid collection is present. The internal auditory canals are within normal limits. The brainstem and cerebellum are within normal limits. Vascular: Normal flow voids. Skull and upper cervical spine: The craniocervical junction is normal. Upper cervical spine is within normal limits. Marrow signal is unremarkable. Sinuses/Orbits: The paranasal sinuses and mastoid air cells are clear. The globes and orbits are within normal limits. Other: A sebaceous cyst is present posteriorly in the midline posterior neck. MRA HEAD FINDINGS Internal carotid arteries are within normal limits from the high cervical segments through the ICA termini bilaterally. The right A1  segment is dominant. The anterior communicating artery is patent. MCA bifurcations are within normal limits bilaterally. There is some attenuation of distal small vessels without a significant proximal stenosis or obstruction. No aneurysm is present. The right vertebral artery is the dominant vessel. Right PICA origin is visualized and normal. The left AICA is dominant. The basilar artery is normal. The right posterior cerebral artery originates from the basilar tip. The left posterior cerebral artery is of fetal type. There is some attenuation of distal PCA branch vessels. IMPRESSION: 1. Normal MRI appearance of the brain. No acute or focal lesion to explain the patient's speech abnormalities. 2. Mild distal small vessel disease evident in the MRA circle-of-Willis without significant proximal stenosis, aneurysm, or branch vessel occlusion. Electronically Signed   By: San Morelle  M.D.   On: 01/15/2019 04:24   US Carotid Bilateral (at [facility] Only)  Result Date: 01/15/2019 CLINICAL DATA:  68 year old male with aphasia EXAM: BILATERAL CAROTID DUPLEX ULTRASOUND TECHNIQUE: Pearline Cables scale imaging, color Doppler and duplex ultrasound were performed of bilateral carotid and vertebral arteries in the neck. COMPARISON:  None. FINDINGS: Criteria: Quantification of carotid stenosis is based on velocity parameters that correlate the residual internal carotid diameter with NASCET-based stenosis levels, using the diameter of the distal internal carotid lumen as the denominator for stenosis measurement. The following velocity measurements were obtained: RIGHT ICA:  Systolic 782 cm/sec, Diastolic 23 cm/sec CCA:  956 cm/sec SYSTOLIC ICA/CCA RATIO:  1.3 ECA:  212 cm/sec LEFT ICA:  Systolic 213 cm/sec, Diastolic 19 cm/sec CCA:  086 cm/sec SYSTOLIC ICA/CCA RATIO:  1.0 ECA:  164 cm/sec Right Brachial SBP: Not acquired Left Brachial SBP: Not acquired RIGHT CAROTID ARTERY: No significant calcifications of the right common  carotid artery. Intermediate waveform maintained. Heterogeneous and partially calcified plaque at the right carotid bifurcation. No significant lumen shadowing. Low resistance waveform of the right ICA. No significant tortuosity. RIGHT VERTEBRAL ARTERY: Antegrade flow with low resistance waveform. LEFT CAROTID ARTERY: No significant calcifications of the left common carotid artery. Intermediate waveform maintained. Heterogeneous and partially calcified plaque at the left carotid bifurcation without significant lumen shadowing. Low resistance waveform of the left ICA. No significant tortuosity. LEFT VERTEBRAL ARTERY:  Antegrade flow with low resistance waveform. IMPRESSION: Color duplex indicates minimal heterogeneous and calcified plaque, with no hemodynamically significant stenosis by duplex criteria in the extracranial cerebrovascular circulation. Signed, Dulcy Fanny. Dellia Nims, RPVI [facility] Electronically Signed   By: Corrie Mckusick D.O.   On: 01/15/2019 09:29   Mr Jodene Nam Head/brain VH Cm  Result Date: 01/15/2019 CLINICAL DATA:  Focal neuro deficit for greater than 6 hours. Intermittent speech abnormality. EXAM: MRI HEAD WITHOUT CONTRAST MRA HEAD WITHOUT CONTRAST TECHNIQUE: Multiplanar, multiecho pulse sequences of the brain and surrounding structures were obtained without intravenous contrast. Angiographic images of the head were obtained using MRA technique without contrast. COMPARISON:  CT head without contrast 01/14/2019 FINDINGS: MRI HEAD FINDINGS Brain: The diffusion-weighted images demonstrate no acute or subacute infarction. There is no hemorrhage or mass lesion. Mild atrophy is likely within normal limits for age. No significant white matter disease is present. The ventricles are of normal size. No significant extraaxial fluid collection is present. The internal auditory canals are within normal limits. The brainstem and cerebellum are within  normal limits. Vascular: Normal flow voids. Skull and upper cervical spine: The craniocervical junction is normal. Upper cervical spine is within normal limits. Marrow signal is unremarkable. Sinuses/Orbits: The paranasal sinuses and mastoid air cells are clear. The globes and orbits are within normal limits. Other: A sebaceous cyst is present posteriorly in the midline posterior neck. MRA HEAD FINDINGS Internal carotid arteries are within normal limits from the high cervical segments through the ICA termini bilaterally. The right A1 segment is dominant. The anterior communicating artery is patent. MCA bifurcations are within normal limits bilaterally. There is some attenuation of distal small vessels without a significant proximal stenosis or obstruction. No aneurysm is present. The right vertebral artery is the dominant vessel. Right PICA origin is visualized and normal. The left AICA is dominant. The basilar artery is normal. The right posterior cerebral artery originates from the basilar tip. The left posterior cerebral artery is of fetal type. There is some attenuation of distal PCA branch  vessels. IMPRESSION: 1. Normal MRI appearance of the brain. No acute or focal lesion to explain the patient's speech abnormalities. 2. Mild distal small vessel disease evident in the MRA circle-of-Willis without significant proximal stenosis, aneurysm, or branch vessel occlusion. Electronically Signed   By: San Morelle M.D.   On: 01/15/2019 04:24    ASSESSMENT: Iron deficiency anemia, thrombocytopenia  PLAN:    1.  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  Patient's most recent EGD was on August 08, 2018.  Patient also has chronic renal insufficiency and will likely benefit from Procrit.  His iron stores from today continue to be within normal limits.  He does not require additional Feraheme, but will proceed with 40,000 units Procrit.  Patient continues to  decline bone marrow biopsy.  Return to clinic in 6 weeks for laboratory work and Procrit if his hemoglobin remains below 10.0.  Patient will then return to clinic in 3 months for further evaluation and continuation of treatment.   2.  Thrombocytopenia: Chronic and unchanged.  Patient's platelet count is mildly improved 110 today. Most likely multifactorial including secondary to his known cirrhosis.  No intervention is needed.  Consider bone marrow biopsy in the future as above.  I spent a total of 30 minutes face-to-face with the patient of which greater than 50% of the visit was spent in counseling and coordination of care as detailed above.   Patient expressed understanding and was in agreement with this plan. He also understands that He can call clinic at any time with any questions, concerns, or complaints.    Lloyd Huger, MD   01/23/2019 12:09 PM

## 2019-01-23 NOTE — Progress Notes (Signed)
Patient is here today to follow up on his iron deficiency anemia.

## 2019-02-02 ENCOUNTER — Other Ambulatory Visit: Payer: Self-pay

## 2019-02-02 ENCOUNTER — Inpatient Hospital Stay
Admission: EM | Admit: 2019-02-02 | Discharge: 2019-02-05 | DRG: 871 | Disposition: A | Discharge status: Home Health | Payer: Medicare Other | Attending: Internal Medicine | Admitting: Internal Medicine

## 2019-02-02 DIAGNOSIS — K31819 Angiodysplasia of stomach and duodenum without bleeding: Secondary | ICD-10-CM | POA: Diagnosis present

## 2019-02-02 DIAGNOSIS — Z794 Long term (current) use of insulin: Secondary | ICD-10-CM

## 2019-02-02 DIAGNOSIS — Z79899 Other long term (current) drug therapy: Secondary | ICD-10-CM

## 2019-02-02 DIAGNOSIS — J189 Pneumonia, unspecified organism: Secondary | ICD-10-CM | POA: Diagnosis present

## 2019-02-02 DIAGNOSIS — N183 Chronic kidney disease, stage 3 (moderate): Secondary | ICD-10-CM | POA: Diagnosis present

## 2019-02-02 DIAGNOSIS — Z6841 Body Mass Index (BMI) 40.0 and over, adult: Secondary | ICD-10-CM

## 2019-02-02 DIAGNOSIS — E1122 Type 2 diabetes mellitus with diabetic chronic kidney disease: Secondary | ICD-10-CM | POA: Diagnosis present

## 2019-02-02 DIAGNOSIS — Y95 Nosocomial condition: Secondary | ICD-10-CM | POA: Diagnosis present

## 2019-02-02 DIAGNOSIS — A419 Sepsis, unspecified organism: Secondary | ICD-10-CM | POA: Diagnosis not present

## 2019-02-02 DIAGNOSIS — E1151 Type 2 diabetes mellitus with diabetic peripheral angiopathy without gangrene: Secondary | ICD-10-CM | POA: Diagnosis present

## 2019-02-02 DIAGNOSIS — Z833 Family history of diabetes mellitus: Secondary | ICD-10-CM

## 2019-02-02 DIAGNOSIS — K7210 Chronic hepatic failure without coma: Secondary | ICD-10-CM | POA: Diagnosis present

## 2019-02-02 DIAGNOSIS — Z823 Family history of stroke: Secondary | ICD-10-CM

## 2019-02-02 DIAGNOSIS — K7682 Hepatic encephalopathy: Secondary | ICD-10-CM

## 2019-02-02 DIAGNOSIS — Z20828 Contact with and (suspected) exposure to other viral communicable diseases: Secondary | ICD-10-CM | POA: Diagnosis present

## 2019-02-02 DIAGNOSIS — I872 Venous insufficiency (chronic) (peripheral): Secondary | ICD-10-CM | POA: Diagnosis present

## 2019-02-02 DIAGNOSIS — Z791 Long term (current) use of non-steroidal anti-inflammatories (NSAID): Secondary | ICD-10-CM

## 2019-02-02 DIAGNOSIS — Z8249 Family history of ischemic heart disease and other diseases of the circulatory system: Secondary | ICD-10-CM

## 2019-02-02 DIAGNOSIS — K7460 Unspecified cirrhosis of liver: Secondary | ICD-10-CM | POA: Diagnosis present

## 2019-02-02 DIAGNOSIS — D61818 Other pancytopenia: Secondary | ICD-10-CM | POA: Diagnosis present

## 2019-02-02 DIAGNOSIS — I129 Hypertensive chronic kidney disease with stage 1 through stage 4 chronic kidney disease, or unspecified chronic kidney disease: Secondary | ICD-10-CM | POA: Diagnosis present

## 2019-02-02 DIAGNOSIS — L03115 Cellulitis of right lower limb: Secondary | ICD-10-CM | POA: Diagnosis present

## 2019-02-02 DIAGNOSIS — I878 Other specified disorders of veins: Secondary | ICD-10-CM | POA: Diagnosis present

## 2019-02-02 DIAGNOSIS — E669 Obesity, unspecified: Secondary | ICD-10-CM | POA: Diagnosis present

## 2019-02-02 DIAGNOSIS — G4730 Sleep apnea, unspecified: Secondary | ICD-10-CM | POA: Diagnosis present

## 2019-02-02 DIAGNOSIS — F17290 Nicotine dependence, other tobacco product, uncomplicated: Secondary | ICD-10-CM | POA: Diagnosis present

## 2019-02-02 DIAGNOSIS — L03116 Cellulitis of left lower limb: Secondary | ICD-10-CM | POA: Diagnosis present

## 2019-02-02 DIAGNOSIS — K2270 Barrett's esophagus without dysplasia: Secondary | ICD-10-CM | POA: Diagnosis present

## 2019-02-02 DIAGNOSIS — M1990 Unspecified osteoarthritis, unspecified site: Secondary | ICD-10-CM | POA: Diagnosis present

## 2019-02-02 DIAGNOSIS — E785 Hyperlipidemia, unspecified: Secondary | ICD-10-CM | POA: Diagnosis present

## 2019-02-02 DIAGNOSIS — I8510 Secondary esophageal varices without bleeding: Secondary | ICD-10-CM | POA: Diagnosis present

## 2019-02-02 DIAGNOSIS — K7290 Hepatic failure, unspecified without coma: Secondary | ICD-10-CM

## 2019-02-02 DIAGNOSIS — K219 Gastro-esophageal reflux disease without esophagitis: Secondary | ICD-10-CM | POA: Diagnosis present

## 2019-02-02 DIAGNOSIS — K449 Diaphragmatic hernia without obstruction or gangrene: Secondary | ICD-10-CM | POA: Diagnosis present

## 2019-02-02 NOTE — ED Triage Notes (Signed)
Pt in with co shob and fever since today with nausea. IV per ems, given zofran 4 mg IV, initial sats per ems 78-80% on RA.

## 2019-02-03 ENCOUNTER — Emergency Department: Payer: Medicare Other

## 2019-02-03 ENCOUNTER — Other Ambulatory Visit: Payer: Self-pay

## 2019-02-03 DIAGNOSIS — K2270 Barrett's esophagus without dysplasia: Secondary | ICD-10-CM | POA: Diagnosis present

## 2019-02-03 DIAGNOSIS — G4730 Sleep apnea, unspecified: Secondary | ICD-10-CM | POA: Diagnosis present

## 2019-02-03 DIAGNOSIS — K449 Diaphragmatic hernia without obstruction or gangrene: Secondary | ICD-10-CM | POA: Diagnosis present

## 2019-02-03 DIAGNOSIS — L03115 Cellulitis of right lower limb: Secondary | ICD-10-CM | POA: Diagnosis present

## 2019-02-03 DIAGNOSIS — E1122 Type 2 diabetes mellitus with diabetic chronic kidney disease: Secondary | ICD-10-CM | POA: Diagnosis present

## 2019-02-03 DIAGNOSIS — I129 Hypertensive chronic kidney disease with stage 1 through stage 4 chronic kidney disease, or unspecified chronic kidney disease: Secondary | ICD-10-CM | POA: Diagnosis present

## 2019-02-03 DIAGNOSIS — A419 Sepsis, unspecified organism: Secondary | ICD-10-CM | POA: Diagnosis present

## 2019-02-03 DIAGNOSIS — K7210 Chronic hepatic failure without coma: Secondary | ICD-10-CM | POA: Diagnosis present

## 2019-02-03 DIAGNOSIS — J189 Pneumonia, unspecified organism: Secondary | ICD-10-CM | POA: Diagnosis present

## 2019-02-03 DIAGNOSIS — I8510 Secondary esophageal varices without bleeding: Secondary | ICD-10-CM | POA: Diagnosis present

## 2019-02-03 DIAGNOSIS — Y95 Nosocomial condition: Secondary | ICD-10-CM | POA: Diagnosis present

## 2019-02-03 DIAGNOSIS — E785 Hyperlipidemia, unspecified: Secondary | ICD-10-CM | POA: Diagnosis present

## 2019-02-03 DIAGNOSIS — Z6841 Body Mass Index (BMI) 40.0 and over, adult: Secondary | ICD-10-CM | POA: Diagnosis not present

## 2019-02-03 DIAGNOSIS — K7460 Unspecified cirrhosis of liver: Secondary | ICD-10-CM | POA: Diagnosis present

## 2019-02-03 DIAGNOSIS — F17290 Nicotine dependence, other tobacco product, uncomplicated: Secondary | ICD-10-CM | POA: Diagnosis present

## 2019-02-03 DIAGNOSIS — Z20828 Contact with and (suspected) exposure to other viral communicable diseases: Secondary | ICD-10-CM | POA: Diagnosis present

## 2019-02-03 DIAGNOSIS — L03116 Cellulitis of left lower limb: Secondary | ICD-10-CM | POA: Diagnosis present

## 2019-02-03 DIAGNOSIS — Z8249 Family history of ischemic heart disease and other diseases of the circulatory system: Secondary | ICD-10-CM | POA: Diagnosis not present

## 2019-02-03 DIAGNOSIS — E669 Obesity, unspecified: Secondary | ICD-10-CM | POA: Diagnosis present

## 2019-02-03 DIAGNOSIS — I878 Other specified disorders of veins: Secondary | ICD-10-CM | POA: Diagnosis present

## 2019-02-03 DIAGNOSIS — N183 Chronic kidney disease, stage 3 (moderate): Secondary | ICD-10-CM | POA: Diagnosis present

## 2019-02-03 DIAGNOSIS — E1151 Type 2 diabetes mellitus with diabetic peripheral angiopathy without gangrene: Secondary | ICD-10-CM | POA: Diagnosis present

## 2019-02-03 DIAGNOSIS — K31819 Angiodysplasia of stomach and duodenum without bleeding: Secondary | ICD-10-CM | POA: Diagnosis present

## 2019-02-03 DIAGNOSIS — D61818 Other pancytopenia: Secondary | ICD-10-CM | POA: Diagnosis present

## 2019-02-03 DIAGNOSIS — K219 Gastro-esophageal reflux disease without esophagitis: Secondary | ICD-10-CM | POA: Diagnosis present

## 2019-02-03 LAB — CBC WITH DIFFERENTIAL/PLATELET
Abs Immature Granulocytes: 0.03 10*3/uL (ref 0.00–0.07)
Basophils Absolute: 0 10*3/uL (ref 0.0–0.1)
Basophils Relative: 0 %
Eosinophils Absolute: 0.2 10*3/uL (ref 0.0–0.5)
Eosinophils Relative: 2 %
HCT: 24.8 % — ABNORMAL LOW (ref 39.0–52.0)
Hemoglobin: 8.2 g/dL — ABNORMAL LOW (ref 13.0–17.0)
Immature Granulocytes: 0 %
Lymphocytes Relative: 6 %
Lymphs Abs: 0.5 10*3/uL — ABNORMAL LOW (ref 0.7–4.0)
MCH: 30.7 pg (ref 26.0–34.0)
MCHC: 33.1 g/dL (ref 30.0–36.0)
MCV: 92.9 fL (ref 80.0–100.0)
Monocytes Absolute: 0.4 10*3/uL (ref 0.1–1.0)
Monocytes Relative: 5 %
Neutro Abs: 7.7 10*3/uL (ref 1.7–7.7)
Neutrophils Relative %: 87 %
Platelets: 97 10*3/uL — ABNORMAL LOW (ref 150–400)
RBC: 2.67 MIL/uL — ABNORMAL LOW (ref 4.22–5.81)
RDW: 16.3 % — ABNORMAL HIGH (ref 11.5–15.5)
WBC: 8.9 10*3/uL (ref 4.0–10.5)
nRBC: 0 % (ref 0.0–0.2)

## 2019-02-03 LAB — COMPREHENSIVE METABOLIC PANEL
ALT: 27 U/L (ref 0–44)
AST: 32 U/L (ref 15–41)
Albumin: 3.1 g/dL — ABNORMAL LOW (ref 3.5–5.0)
Alkaline Phosphatase: 83 U/L (ref 38–126)
Anion gap: 8 (ref 5–15)
BUN: 28 mg/dL — ABNORMAL HIGH (ref 8–23)
CO2: 22 mmol/L (ref 22–32)
Calcium: 8.8 mg/dL — ABNORMAL LOW (ref 8.9–10.3)
Chloride: 111 mmol/L (ref 98–111)
Creatinine, Ser: 1.97 mg/dL — ABNORMAL HIGH (ref 0.61–1.24)
GFR calc Af Amer: 40 mL/min — ABNORMAL LOW (ref >60)
GFR calc non Af Amer: 34 mL/min — ABNORMAL LOW (ref >60)
Glucose, Bld: 125 mg/dL — ABNORMAL HIGH (ref 70–99)
Potassium: 3.7 mmol/L (ref 3.5–5.1)
Sodium: 141 mmol/L (ref 135–145)
Total Bilirubin: 0.5 mg/dL (ref 0.3–1.2)
Total Protein: 6.6 g/dL (ref 6.5–8.1)

## 2019-02-03 LAB — GLUCOSE, CAPILLARY
Glucose-Capillary: 181 mg/dL — ABNORMAL HIGH (ref 70–99)
Glucose-Capillary: 208 mg/dL — ABNORMAL HIGH (ref 70–99)
Glucose-Capillary: 234 mg/dL — ABNORMAL HIGH (ref 70–99)
Glucose-Capillary: 225 mg/dL — ABNORMAL HIGH (ref 70–99)

## 2019-02-03 LAB — LACTIC ACID, PLASMA
Lactic Acid, Venous: 1.2 mmol/L (ref 0.5–1.9)
Lactic Acid, Venous: 2.1 mmol/L (ref 0.5–1.9)

## 2019-02-03 LAB — SARS CORONAVIRUS 2 BY RT PCR (HOSPITAL ORDER, PERFORMED IN ~~LOC~~ HOSPITAL LAB): SARS Coronavirus 2: NEGATIVE

## 2019-02-03 LAB — AMMONIA: Ammonia: 86 umol/L — ABNORMAL HIGH (ref 9–35)

## 2019-02-03 LAB — TSH: TSH: 2.627 u[IU]/mL (ref 0.350–4.500)

## 2019-02-03 MED ORDER — HEPARIN SODIUM (PORCINE) 5000 UNIT/ML IJ SOLN
5000.0000 [IU] | Freq: Three times a day (TID) | INTRAMUSCULAR | Status: DC
  Administered 2019-02-03 – 2019-02-05 (×7): 5000 [IU] via SUBCUTANEOUS
  Filled 2019-02-03 (×7): qty 1

## 2019-02-03 MED ORDER — ONDANSETRON HCL 4 MG PO TABS: 4.0000 mg | ORAL_TABLET | Freq: Four times a day (QID) | ORAL | Status: DC | PRN

## 2019-02-03 MED ORDER — VANCOMYCIN HCL 10 G IV SOLR
1250.0000 mg | INTRAVENOUS | Status: DC
  Administered 2019-02-04 – 2019-02-05 (×2): 1250 mg via INTRAVENOUS
  Filled 2019-02-03 (×3): qty 1250

## 2019-02-03 MED ORDER — VANCOMYCIN HCL 10 G IV SOLR
2000.0000 mg | Freq: Once | INTRAVENOUS | Status: AC
  Administered 2019-02-03: 2000 mg via INTRAVENOUS
  Filled 2019-02-03: qty 2000

## 2019-02-03 MED ORDER — ACETAMINOPHEN 325 MG PO TABS
650.0000 mg | ORAL_TABLET | Freq: Four times a day (QID) | ORAL | Status: DC | PRN
  Administered 2019-02-03 – 2019-02-05 (×5): 650 mg via ORAL
  Filled 2019-02-03 (×5): qty 2

## 2019-02-03 MED ORDER — DOCUSATE SODIUM 100 MG PO CAPS
100.0000 mg | ORAL_CAPSULE | Freq: Two times a day (BID) | ORAL | Status: DC
  Administered 2019-02-03 – 2019-02-04 (×3): 100 mg via ORAL
  Filled 2019-02-03 (×5): qty 1

## 2019-02-03 MED ORDER — LACTULOSE 10 GM/15ML PO SOLN
20.0000 g | Freq: Two times a day (BID) | ORAL | Status: DC
  Administered 2019-02-04: 20 g via ORAL
  Filled 2019-02-03 (×4): qty 30

## 2019-02-03 MED ORDER — METRONIDAZOLE IN NACL 5-0.79 MG/ML-% IV SOLN
500.0000 mg | Freq: Once | INTRAVENOUS | Status: AC
  Administered 2019-02-03: 500 mg via INTRAVENOUS
  Filled 2019-02-03: qty 100

## 2019-02-03 MED ORDER — SODIUM CHLORIDE 0.9 % IV SOLN
2.0000 g | Freq: Two times a day (BID) | INTRAVENOUS | Status: DC
  Administered 2019-02-03 – 2019-02-05 (×5): 2 g via INTRAVENOUS
  Filled 2019-02-03 (×7): qty 2

## 2019-02-03 MED ORDER — ATORVASTATIN CALCIUM 20 MG PO TABS
40.0000 mg | ORAL_TABLET | Freq: Every day | ORAL | Status: DC
  Administered 2019-02-03 – 2019-02-04 (×2): 40 mg via ORAL
  Filled 2019-02-03 (×2): qty 2

## 2019-02-03 MED ORDER — SODIUM CHLORIDE 0.9 % IV SOLN
2.0000 g | Freq: Once | INTRAVENOUS | Status: AC
  Administered 2019-02-03: 2 g via INTRAVENOUS
  Filled 2019-02-03: qty 2

## 2019-02-03 MED ORDER — INSULIN ASPART 100 UNIT/ML ~~LOC~~ SOLN
0.0000 [IU] | Freq: Three times a day (TID) | SUBCUTANEOUS | Status: DC
  Administered 2019-02-03: 09:00:00 4 [IU] via SUBCUTANEOUS
  Administered 2019-02-03 (×2): 7 [IU] via SUBCUTANEOUS
  Administered 2019-02-04: 09:00:00 3 [IU] via SUBCUTANEOUS
  Administered 2019-02-04 (×2): 4 [IU] via SUBCUTANEOUS
  Filled 2019-02-03 (×6): qty 1

## 2019-02-03 MED ORDER — FUROSEMIDE 40 MG PO TABS
40.0000 mg | ORAL_TABLET | Freq: Every day | ORAL | Status: DC
  Administered 2019-02-03 – 2019-02-05 (×3): 40 mg via ORAL
  Filled 2019-02-03 (×3): qty 1

## 2019-02-03 MED ORDER — ONDANSETRON HCL 4 MG/2ML IJ SOLN: 4.0000 mg | Freq: Four times a day (QID) | INTRAMUSCULAR | Status: DC | PRN

## 2019-02-03 MED ORDER — LACTULOSE 10 GM/15ML PO SOLN
30.0000 g | Freq: Once | ORAL | Status: AC
  Administered 2019-02-03: 30 g via ORAL
  Filled 2019-02-03: qty 60

## 2019-02-03 MED ORDER — ACETAMINOPHEN 650 MG RE SUPP: 650.0000 mg | Freq: Four times a day (QID) | RECTAL | Status: DC | PRN

## 2019-02-03 MED ORDER — METRONIDAZOLE IN NACL 5-0.79 MG/ML-% IV SOLN
500.0000 mg | Freq: Three times a day (TID) | INTRAVENOUS | Status: DC
  Administered 2019-02-03: 09:00:00 500 mg via INTRAVENOUS
  Filled 2019-02-03 (×3): qty 100

## 2019-02-03 MED ORDER — INSULIN GLARGINE 100 UNIT/ML ~~LOC~~ SOLN
60.0000 [IU] | Freq: Every day | SUBCUTANEOUS | Status: DC
  Administered 2019-02-03 – 2019-02-04 (×2): 60 [IU] via SUBCUTANEOUS
  Filled 2019-02-03 (×3): qty 0.6

## 2019-02-03 MED ORDER — SODIUM CHLORIDE 0.9 % IV SOLN
INTRAVENOUS | Status: DC
  Administered 2019-02-03 – 2019-02-04 (×2): via INTRAVENOUS

## 2019-02-03 MED ORDER — INSULIN ASPART 100 UNIT/ML ~~LOC~~ SOLN: 0.0000 [IU] | Freq: Every day | SUBCUTANEOUS | Status: AC

## 2019-02-03 MED ORDER — VANCOMYCIN HCL IN DEXTROSE 1-5 GM/200ML-% IV SOLN: 1000.0000 mg | Freq: Once | INTRAVENOUS | Status: DC

## 2019-02-03 NOTE — Progress Notes (Signed)
68 year old male with past medical history significant for liver cirrhosis, history of hepatic encephalopathy, esophageal varices, gastric AV malformations, diabetes hypertension obesity was admitted secondary to fever of 102 F and dyspnea.  Patient needed BiPAP initially in the ED. Coronavirus test is negative.  Chest x-ray showing pneumonitis and mild pulmonary vascular congestion. -Last echocardiogram showing normal EF with LVH and may be diastolic dysfunction. -Patient is currently on 3 L oxygen which is acute.  He is from home. -H&P reviewed.  Continue current management with cefepime.  Oral Lasix added. -MRSA PCR and DC vancomycin if negative. Pt consult

## 2019-02-03 NOTE — ED Notes (Signed)
Pt has removed his Bi-Pap for the 3rd time in the last hour despite frequent re-education on the importance. Pt has maintained O2 sats in the 95-98% range on RA. Dr Owens Shark made aware and order for continuous Bi-Pap modified to PRN. Pt placed on 3L O2 via [place] at this time, and will be monitored very closely for any issues with desaturation.

## 2019-02-03 NOTE — ED Provider Notes (Addendum)
[facility] Emergency Department Provider Note __________________________________   First MD Initiated Contact with Patient 02/03/19 0025     (approximate)  I have reviewed the triage vital signs and the nursing notes.   HISTORY  Chief Complaint Fever    HPI Corey West is a 68 y.o. male with below list of previous medical conditions including cirrhosis hepatic encephalopathy chronic kidney disease hypertension diabetes C. difficile colitis and recent hospital admission this month presents to the emergency department fever nausea dyspnea oxygen saturation 78 to 80% on room air per EMS.  Patient noted to be febrile on arrival a temperature 102.3.        Past Medical History:  Diagnosis Date   Anemia    Anxiety    controlled;    Arthritis    AVM (arteriovenous malformation) of stomach, acquired with hemorrhage    Barrett's esophagus    Chronic kidney disease    renal infufficiency   Cirrhosis (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 difficile diarrhea 12/02/2018   Ankle fracture, left, closed, initial encounter 10/15/2018   Ankle  fracture, right, closed, initial encounter 10/15/2018   Ankle fracture 10/15/2018   Pressure injury of skin 07/19/2018   UTI (urinary tract infection) 07/18/2018   CKD (chronic kidney disease) stage 3, GFR 30-59 ml/min (HCC) 01/02/2018   GAVE (gastric antral vascular ectasia) 01/02/2018   Goals of care, counseling/discussion    Palliative care encounter    Hepatic encephalopathy syndrome ([place]) 12/16/2017   Hepatic encephalopathy ([place]) 10/18/2017   Hypokalemia 10/09/2017   Hyperglycemia 10/09/2017   Symptomatic anemia 06/26/2017   Sepsis ([place]) 06/12/2017   Type 2 diabetes mellitus with diabetic nephropathy, with long-term current use of insulin ([place]) 05/29/2017   Cellulitis in diabetic foot ([place]) 02/21/2017   Cellulitis 02/21/2017   GI bleed 07/20/2015   Hematemesis 02/08/2015   Cirrhosis ([place]) 02/08/2015   Esophageal varices ([place]) 02/08/2015   Gastroesophageal reflux disease 02/08/2015   Hyperlipidemia 02/08/2015   Chronic venous stasis dermatitis of both lower extremities 10/06/2014   Edema 05/25/2014   Hypertension 05/25/2014   Obesity 05/25/2014   Sleep apnea 05/25/2014   Barrett's esophagus 05/25/2014   Pancytopenia ([place]) 05/25/2014   Iron deficiency anemia 12/04/2013   Thrombocytopenia ([place]) 12/04/2013   Splenomegaly 11/04/2013    Past Surgical History:  Procedure Laterality Date   ESOPHAGOGASTRODUODENOSCOPY N/A 02/09/2015   Procedure: ESOPHAGOGASTRODUODENOSCOPY (EGD);  Surgeon: Manya Silvas, MD;  Location: [facility] ENDOSCOPY;  Service: Endoscopy;  Laterality:  N/A;   ESOPHAGOGASTRODUODENOSCOPY N/A 07/22/2015   Procedure: ESOPHAGOGASTRODUODENOSCOPY (EGD);  Surgeon: Manya Silvas, MD;  Location: [facility] ENDOSCOPY;  Service: Endoscopy;  Laterality: N/A;   ESOPHAGOGASTRODUODENOSCOPY N/A 06/26/2017   Procedure: ESOPHAGOGASTRODUODENOSCOPY (EGD);  Surgeon: Manya Silvas, MD;  Location: [facility] ENDOSCOPY;  Service: Endoscopy;  Laterality: N/A;     ESOPHAGOGASTRODUODENOSCOPY N/A 06/27/2017   Procedure: ESOPHAGOGASTRODUODENOSCOPY (EGD);  Surgeon: Manya Silvas, MD;  Location: [facility] ENDOSCOPY;  Service: Endoscopy;  Laterality: N/A;   ESOPHAGOGASTRODUODENOSCOPY N/A 06/28/2017   Procedure: ESOPHAGOGASTRODUODENOSCOPY (EGD);  Surgeon: Manya Silvas, MD;  Location: [facility] ENDOSCOPY;  Service: Endoscopy;  Laterality: N/A;   ESOPHAGOGASTRODUODENOSCOPY Bilateral 10/11/2017   Procedure: ESOPHAGOGASTRODUODENOSCOPY (EGD);  Surgeon: Manya Silvas, MD;  Location: [facility] ENDOSCOPY;  Service: Endoscopy;  Laterality: Bilateral;   ESOPHAGOGASTRODUODENOSCOPY N/A 12/04/2017   Procedure: ESOPHAGOGASTRODUODENOSCOPY (EGD);  Surgeon: Manya Silvas, MD;  Location: [facility] ENDOSCOPY;  Service: Endoscopy;  Laterality: N/A;   ESOPHAGOGASTRODUODENOSCOPY (EGD) WITH PROPOFOL N/A 07/20/2015   Procedure: ESOPHAGOGASTRODUODENOSCOPY (EGD) WITH PROPOFOL;  Surgeon: Manya Silvas, MD;  Location: [facility] ENDOSCOPY;  Service: Endoscopy;  Laterality: N/A;   ESOPHAGOGASTRODUODENOSCOPY (EGD) WITH PROPOFOL N/A 09/16/2015   Procedure: ESOPHAGOGASTRODUODENOSCOPY (EGD) WITH PROPOFOL;  Surgeon: Manya Silvas, MD;  Location: [facility] ENDOSCOPY;  Service: Endoscopy;  Laterality: N/A;   ESOPHAGOGASTRODUODENOSCOPY (EGD) WITH PROPOFOL N/A 03/16/2016   Procedure: ESOPHAGOGASTRODUODENOSCOPY (EGD) WITH PROPOFOL;  Surgeon: Manya Silvas, MD;  Location: [facility] ENDOSCOPY;  Service: Endoscopy;  Laterality: N/A;   ESOPHAGOGASTRODUODENOSCOPY (EGD) WITH PROPOFOL N/A 09/14/2016   Procedure: ESOPHAGOGASTRODUODENOSCOPY (EGD) WITH PROPOFOL;  Surgeon: Manya Silvas, MD;  Location: [facility] ENDOSCOPY;  Service: Endoscopy;  Laterality: N/A;   ESOPHAGOGASTRODUODENOSCOPY (EGD) WITH PROPOFOL N/A 11/05/2016   Procedure: ESOPHAGOGASTRODUODENOSCOPY (EGD) WITH PROPOFOL;  Surgeon: Manya Silvas, MD;  Location: [facility] ENDOSCOPY;  Service: Endoscopy;  Laterality: N/A;   ESOPHAGOGASTRODUODENOSCOPY (EGD) WITH  PROPOFOL N/A 02/06/2017   Procedure: ESOPHAGOGASTRODUODENOSCOPY (EGD) WITH PROPOFOL;  Surgeon: Manya Silvas, MD;  Location: [facility] ENDOSCOPY;  Service: Endoscopy;  Laterality: N/A;   ESOPHAGOGASTRODUODENOSCOPY (EGD) WITH PROPOFOL N/A 04/24/2017   Procedure: ESOPHAGOGASTRODUODENOSCOPY (EGD) WITH PROPOFOL;  Surgeon: Manya Silvas, MD;  Location: [facility] ENDOSCOPY;  Service: Endoscopy;  Laterality: N/A;   ESOPHAGOGASTRODUODENOSCOPY (EGD) WITH PROPOFOL N/A 07/31/2017   Procedure: ESOPHAGOGASTRODUODENOSCOPY (EGD) WITH PROPOFOL;  Surgeon: Manya Silvas, MD;  Location: [facility] ENDOSCOPY;  Service: Endoscopy;  Laterality: N/A;   ESOPHAGOGASTRODUODENOSCOPY (EGD) WITH PROPOFOL N/A 08/08/2018   Procedure: ESOPHAGOGASTRODUODENOSCOPY (EGD) WITH PROPOFOL;  Surgeon: Manya Silvas, MD;  Location: [facility] ENDOSCOPY;  Service: Endoscopy;  Laterality: N/A;   GIVENS CAPSULE STUDY N/A 12/05/2017   Procedure: GIVENS CAPSULE STUDY;  Surgeon: Manya Silvas, MD;  Location: [facility] ENDOSCOPY;  Service: Endoscopy;  Laterality: N/A;   TONSILLECTOMY     TONSILLECTOMY AND ADENOIDECTOMY     ULNAR NERVE TRANSPOSITION     UVULOPALATOPHARYNGOPLASTY      Prior to Admission medications   Medication Sig Start Date End Date Taking? Authorizing Provider  dextromethorphan (DELSYM) 30 MG/5ML liquid Take 30 mg by mouth 2 (two) times daily.    [provider]  famotidine (PEPCID) 20 MG tablet Take 20 mg by mouth 2 (two) times daily. 01/13/19   [provider]  ferrous sulfate 325 (65 FE) MG tablet Take 325 mg by mouth 2 (two) times daily with a meal.     [provider]  furosemide (LASIX) 40 MG tablet Take 1 tablet by mouth 2 (two) times a day. 01/13/19   [provider]  HYDROcodone-acetaminophen (NORCO/VICODIN) 5-325 MG tablet Take 1 tablet by mouth every 6 (six) hours as needed for severe pain. 12/09/18   Hillary Bow, MD  insulin aspart protamine - aspart (NOVOLOG 70/30 MIX) (70-30) 100  UNIT/ML FlexPen Inject 85 Units into the skin daily with breakfast.     [provider]  insulin aspart protamine - aspart (NOVOLOG 70/30 MIX) (70-30) 100 UNIT/ML FlexPen Inject 72 Units into the skin at bedtime.     [provider]  lactulose (CHRONULAC) 10 GM/15ML solution  12/11/18   [provider]  loperamide (IMODIUM) 2 MG capsule Take 2 mg by mouth 3 (three) times daily.    [provider]  LORazepam (ATIVAN) 0.5 MG tablet Take 1 tablet (0.5 mg total) by mouth every 8 (eight) hours as needed for anxiety. 10/18/18   Gladstone Lighter, MD  Multiple Vitamin (MULTIVITAMIN) tablet Take 1 tablet by mouth daily.    [provider]  omeprazole (PRILOSEC) 40 MG capsule Take 40 mg by mouth daily.    [provider]  Potassium Chloride ER 20 MEQ TBCR Take 20 mEq by mouth 2 (two) times daily. While on lasix' 10/18/18   Gladstone Lighter, MD  potassium chloride SA (K-DUR) 20 MEQ tablet Take 2 tablets by mouth 2 (two) times a day. 12/12/18   [provider]  ranitidine (ZANTAC) 150 MG tablet Take 1 tablet by mouth 1 day or 1 dose. 12/11/18   [provider]  rifaximin (XIFAXAN) 550 MG TABS tablet Take 1 tablet (550 mg total) by mouth 2 (two) times daily. 01/01/18   Loletha Grayer, MD  traMADol (ULTRAM) 50 MG tablet Take 50 mg by mouth 3 (three) times daily as needed. 12/24/18   [provider]  ULTICARE MINI PEN NEEDLES 31G X 6 MM [place]  01/13/19   [provider]  vitamin C (ASCORBIC ACID) 250 MG tablet Take 500 mg by mouth 2 (two) times daily.    [provider]  zinc sulfate 220 (50 Zn) MG capsule Take 220 mg by mouth daily.    [provider]    Allergies Patient has no known allergies.  Family History  Problem Relation Age of Onset   Diabetes Other    Transient ischemic attack Father    CAD Father     Social History Social History   Tobacco Use   Smoking status: Current Some Day  Smoker    Years: 20.00    Types: Cigars   Smokeless tobacco: Never Used  Substance Use Topics   Alcohol use: No    Comment: stopped 15 years ago   Drug use: No    Review of Systems Constitutional: Positive for fever/chills Eyes: No visual changes. ENT: No sore throat. Cardiovascular: Denies chest pain. Respiratory: Positive for shortness of breath. Gastrointestinal: No abdominal pain.  No nausea, positive for nausea and diarrhea.  No constipation. Genitourinary: Negative for dysuria. Musculoskeletal: Negative for neck pain.  Negative for back pain. Integumentary: Negative for rash. Neurological: Negative for headaches, focal weakness or numbness.   ____________________________________________   PHYSICAL EXAM:  VITAL SIGNS: ED Triage Vitals  Enc Vitals Group     BP 02/02/19 2353 (!) 192/59     Pulse Rate 02/02/19 2350 96     Resp 02/02/19 2350 (!) 24     Temp 02/02/19 2350 (!) 102.3 F (39.1 C)     Temp Source 02/02/19 2350 Oral     SpO2 02/02/19 2350 100 %  Weight 02/02/19 2351 125.6 kg (276 lb 14.4 oz)     Height --      Head Circumference --      Peak Flow --      Pain Score 02/02/19 2351 0     Pain Loc --      Pain Edu? --      Excl. in [place]? --     Constitutional: Alert but confused apparent respiratory distress Eyes: Conjunctivae are normal. Head: Atraumatic. Mouth/Throat: Mucous membranes are moist. Oropharynx non-erythematous. Neck: No stridor.  Cardiovascular: Normal rate, regular rhythm. Good peripheral circulation. Grossly normal heart sounds. Respiratory: Tachypnea, positive accessory respiratory muscle use, bibasilar rhonchi Gastrointestinal: Soft and nontender. No distention.  Musculoskeletal: No lower extremity tenderness nor edema. No gross deformities of extremities. Neurologic: Somnolent normal speech and language. No gross focal neurologic deficits are appreciated.  Skin:  Skin is warm, dry and intact. No rash noted. Psychiatric: Mood  and affect are normal. Speech and behavior are normal.  ____________________________________________   LABS (all labs ordered are listed, but only abnormal results are displayed)  Labs Reviewed  LACTIC ACID, PLASMA - Abnormal; Notable for the following components:      Result Value   Lactic Acid, Venous 2.1 (*)    All other components within normal limits  COMPREHENSIVE METABOLIC PANEL - Abnormal; Notable for the following components:   Glucose, Bld 125 (*)    BUN 28 (*)    Creatinine, Ser 1.97 (*)    Calcium 8.8 (*)    Albumin 3.1 (*)    GFR calc non Af Amer 34 (*)    GFR calc Af Amer 40 (*)    All other components within normal limits  CBC WITH DIFFERENTIAL/PLATELET - Abnormal; Notable for the following components:   RBC 2.67 (*)    Hemoglobin 8.2 (*)    HCT 24.8 (*)    RDW 16.3 (*)    Platelets 97 (*)    Lymphs Abs 0.5 (*)    All other components within normal limits  AMMONIA - Abnormal; Notable for the following components:   Ammonia 86 (*)    All other components within normal limits  SARS CORONAVIRUS 2 (HOSPITAL ORDER, [place] [facility])  CULTURE, BLOOD (ROUTINE X 2)  CULTURE, BLOOD (ROUTINE X 2)  LACTIC ACID, PLASMA  URINALYSIS, ROUTINE W REFLEX MICROSCOPIC     RADIOLOGY I, [place], personally viewed and evaluated these images (plain radiographs) as part of my medical decision making, as well as reviewing the written report by the radiologist.  ED MD interpretation:  Low lung volumes. Increased reticular opacities bilaterally  Official radiology report(s): Dg Chest Port 1 View  Result Date: 02/03/2019 CLINICAL DATA:  68 y/o  M; shortness of breath, fever, nausea. EXAM: PORTABLE CHEST 1 VIEW COMPARISON:  01/14/2019 chest radiograph. FINDINGS: Stable cardiomegaly given projection and technique. Low lung volumes. Increased reticular opacities. No consolidation, effusion, or pneumothorax. Bones are unremarkable. IMPRESSION: Low lung  volumes. Cardiomegaly. Increased reticular opacities may represent interstitial edema or atypical pneumonia. Electronically Signed   By: Kristine Garbe M.D.   On: 02/03/2019 00:53      .Critical Care Performed by: Gregor Hams, MD Authorized by: Gregor Hams, MD   Critical care provider statement:    Critical care time (minutes):  45   Critical care time was exclusive of:  Separately billable procedures and treating other patients (Hepatic encephalopathy)   Critical care was necessary to treat or prevent  imminent or life-threatening deterioration of the following conditions:  Sepsis   Critical care was time spent personally by me on the following activities:  Development of treatment plan with patient or surrogate, discussions with consultants, evaluation of patient's response to treatment, examination of patient, obtaining history from patient or surrogate, ordering and performing treatments and interventions, ordering and review of laboratory studies, ordering and review of radiographic studies, pulse oximetry, re-evaluation of patient's condition and review of old charts   I assumed direction of critical care for this patient from another provider in my specialty: no       ____________________________________________   INITIAL IMPRESSION / MDM / ASSESSMENT AND PLAN / ED COURSE  As part of my medical decision making, I reviewed the following data within the electronic MEDICAL RECORD NUMBER   68 year old male with history and physical exam with clinical exam concerning for possible sepsis possibly secondary to pneumonia.  Also considered possibility of COVID-19.  Also concern for possible hepatic encephalopathy given confusion and somnolence laboratory data notable for lactic acid of 2.1 creatinine 1.97 with a BUN of 28 ammonia 86.  Chest x-ray finding concerning for possible bilateral pneumonia.  BiPAP applied patient tolerating it well at the present.  Sepsis protocol was  initiated on patient arrival.  Patient given appropriate antibiotic therapy.  Patient discussed with Dr. Jannifer Franklin for hospital admission further evaluation and management.  *Corey West was evaluated in Emergency Department on 02/03/2019 for the symptoms described in the history of present illness. He was evaluated in the context of the global COVID-19 pandemic, which necessitated consideration that the patient might be at risk for infection with the SARS-CoV-2 virus that causes COVID-19. Institutional protocols and algorithms that pertain to the evaluation of patients at risk for COVID-19 are in a state of rapid change based on information released by regulatory bodies including the CDC and federal and state organizations. These policies and algorithms were followed during the patient's care in the ED.*    ____________________________________________  FINAL CLINICAL IMPRESSION(S) / ED DIAGNOSES  Final diagnoses:  Sepsis, due to unspecified organism, unspecified whether acute organ dysfunction present ([place])  Community acquired pneumonia, unspecified laterality  Hepatic encephalopathy ([place])     MEDICATIONS GIVEN DURING THIS VISIT:  Medications  vancomycin (VANCOCIN) 2,000 mg in sodium chloride 0.9 % 500 mL IVPB (2,000 mg Intravenous New Bag/Given 02/03/19 0119)  ceFEPIme (MAXIPIME) 2 g in sodium chloride 0.9 % 100 mL IVPB (0 g Intravenous Stopped 02/03/19 0119)  metroNIDAZOLE (FLAGYL) IVPB 500 mg (0 mg Intravenous Stopped 02/03/19 0141)     ED Discharge Orders    None       Note:  This document was prepared using Dragon voice recognition software and may include unintentional dictation errors.   Gregor Hams, MD 02/03/19 0231    Gregor Hams, MD 02/03/19 9622    Gregor Hams, MD 02/03/19 0236    Gregor Hams, MD 02/03/19 [phone]

## 2019-02-03 NOTE — Progress Notes (Signed)
CODE SEPSIS - PHARMACY COMMUNICATION  **Broad Spectrum Antibiotics should be administered within 1 hour of Sepsis diagnosis**  Time Code Sepsis Called/Page Received: 0013  Antibiotics Ordered: vanc/cefepime/flagyl  Time of 1st antibiotic administration: 0036  Additional action taken by pharmacy:   If necessary, Name of Provider/Nurse Contacted:     Tobie Lords ,PharmD Clinical Pharmacist  02/03/2019  3:36 AM

## 2019-02-03 NOTE — ED Notes (Addendum)
Dr Owens Shark made aware at this time of pt's elevated Lactic Acid level as reported by lab. Lactic Acid 2.1 mmol/L

## 2019-02-03 NOTE — Progress Notes (Signed)
Pharmacy Antibiotic Note  ROC STREETT is a 68 y.o. male admitted on 02/02/2019 with pneumonia.  Pharmacy has been consulted for vanc/cefepime dosing.  Plan: Patient received vanc 2g load, cefepime 2g, and flagyl 500 mg IV x 1 in ED  Vancomycin 1250 mg IV Q 24 hrs. Goal AUC 400-550. Expected AUC: 538.1 SCr used: 1.97 Cssmin: 14.7  Will continue cefepime 2g IV q12h per CrCl 30 - 60 ml/min and flagyl 500 mg IV q8h.  Weight: 276 lb 14.4 oz (125.6 kg)  Temp (24hrs), Avg:101.1 F (38.4 C), Min:99.8 F (37.7 C), Max:102.3 F (39.1 C)  Recent Labs  Lab 02/03/19 0005 02/03/19 0220  WBC 8.9  --   CREATININE 1.97*  --   LATICACIDVEN 2.1* 1.2    Estimated Creatinine Clearance: 49.8 mL/min (A) (by C-G formula based on SCr of 1.97 mg/dL (H)).    No Known Allergies  Thank you for allowing pharmacy to be a part of this patient's care.  Tobie Lords, PharmD, BCPS Clinical Pharmacist 02/03/2019

## 2019-02-03 NOTE — ED Notes (Signed)
Pt incontinent of urine. Peri-care performed and clean brief placed on pt at this time. Pt made aware of need for urine sample for ordered UA, but reports he can't tell "at night" when he needs to urinate. Pt informed an I&O cath may be necessary to collect a urine sample, but pt states he will refuse if it comes to that.

## 2019-02-03 NOTE — Progress Notes (Signed)
   02/03/19 1600  Clinical Encounter Type  Visited With Patient  Visit Type Initial  Referral From Nurse  Stress Factors  Patient Stress Factors Lack of knowledge   Chaplain received a referral from the unit secretary to talk with this patient. Upon arrival, he was standing with his walker and expressed a desire to see/talk to his nurse and/or physician. He stated that he had been waiting for a while to do so and was becoming increasingly more frustrated. This chaplain relayed the message to the charge nurse and unit secretary who confirmed that a request for his doctor to stop by had been made. Chaplain provided emotional support and a listening ear to the patient.

## 2019-02-03 NOTE — Evaluation (Signed)
Physical Therapy Evaluation Patient Details Name: Corey West MRN: [id number] DOB: [birth year] Today's Date: 02/03/2019   History of Present Illness  68 yo Male presents to hospital with fever and shortness of breath; past medical history significant for liver cirrhosis, history of hepatic encephalopathy, esophageal varices, gastric AV malformations, diabetes hypertension obesity was admitted secondary to fever of 102 F and dyspnea.  Patient needed BiPAP initially in the ED.  Clinical Impression  68 yo Male reports, "I don't need therapy and this is just a waste of time and resources." patient agreeable to get up to bedside chair to assess mobility. He was mod I for bed mobility and transfers exhibits good safety awareness with RW. Patient does exhibits slight weakness in BLE but reports this is his baseline. He does require increased effort to complete daily tasks but is able to transfer and stand/walk without physical assistance. Patient is currently functioning at baseline and does not demonstrate need for additional skilled PT intervention; Will DC in house; Patient left in chair with chair alarm on and needs in reach; RN informed;     Follow Up Recommendations No PT follow up    Equipment Recommendations  None recommended by PT    Recommendations for Other Services       Precautions / Restrictions Precautions Precautions: Fall Restrictions Weight Bearing Restrictions: No      Mobility  Bed Mobility Overal bed mobility: Modified Independent             General bed mobility comments: supine to sitting edge of bed; good positioning and safety awareness; slight increased effort but no physical assistance needed;   Transfers Overall transfer level: Modified independent Equipment used: Rolling walker (2 wheeled)             General transfer comment: sit<>Stand from bed to chair with RW, mod I with good safety awareness and hand placement; increased effort noted but was  steady;   Ambulation/Gait Ambulation/Gait assistance: Modified independent (Device/Increase time) Gait Distance (Feet): 2 Feet Assistive device: Rolling walker (2 wheeled) Gait Pattern/deviations: Step-through pattern Gait velocity: decreased   General Gait Details: takes a few steps to bedside chair with RW, mod I with good safety awareness and positioning; wide base of support, takes longer time but good safety awareness;   Stairs            Wheelchair Mobility    Modified Rankin (Stroke Patients Only)       Balance Overall balance assessment: Modified Independent Sitting-balance support: Feet supported;No upper extremity supported Sitting balance-Leahy Scale: Normal     Standing balance support: Bilateral upper extremity supported Standing balance-Leahy Scale: Good Standing balance comment: uses RW when standing/walking;                              Pertinent Vitals/Pain Pain Assessment: 0-10 Pain Score: 5  Pain Location: right hip Pain Descriptors / Indicators: Aching;Sore Pain Intervention(s): Limited activity within patient's tolerance;Monitored during session;Repositioned    Home Living Family/patient expects to be discharged to:: Private residence Living Arrangements: Alone Available Help at Discharge: Family(niece lives nearby; neighbor assists as needed) Type of Home: Apartment(condo) Home Access: Stairs to enter Entrance Stairs-Rails: Right Entrance Stairs-Number of Steps: 3-4 stairs (uses R railing)  Home Layout: One level Home Equipment: Environmental consultant - 2 wheels;Cane - single point      Prior Function Level of Independence: Independent with assistive device(s)  Comments: uses SPC and RW intermittently;      Hand Dominance        Extremity/Trunk Assessment   Upper Extremity Assessment Upper Extremity Assessment: Generalized weakness    Lower Extremity Assessment Lower Extremity Assessment: LLE deficits/detail;RLE  deficits/detail RLE Deficits / Details: grossly 3/5, intact light touch sensation; does have redness and skin color changes to lower leg to foot with increased swelling noted;  LLE Deficits / Details: grossly 3/5, intact light touch sensation; does have redness and skin color changes to lower leg to foot with increased swelling noted;        Communication   Communication: No difficulties  Cognition Arousal/Alertness: Lethargic Behavior During Therapy: Agitated Overall Cognitive Status: Within Functional Limits for tasks assessed                                        General Comments      Exercises     Assessment/Plan    PT Assessment Patent does not need any further PT services  PT Problem List Decreased strength;Decreased mobility;Decreased activity tolerance       PT Treatment Interventions      PT Goals (Current goals can be found in the Care Plan section)  Acute Rehab PT Goals Patient Stated Goal: to go home PT Goal Formulation: With patient Time For Goal Achievement: 02/03/19 Potential to Achieve Goals: Good    Frequency     Barriers to discharge Decreased caregiver support does have neighbors and family nearby    Co-evaluation               AM-PAC PT "6 Clicks" Mobility  Outcome Measure Help needed turning from your back to your side while in a flat bed without using bedrails?: None Help needed moving from lying on your back to sitting on the side of a flat bed without using bedrails?: None Help needed moving to and from a bed to a chair (including a wheelchair)?: None Help needed standing up from a chair using your arms (e.g., wheelchair or bedside chair)?: None Help needed to walk in hospital room?: None Help needed climbing 3-5 steps with a railing? : A Little 6 Click Score: 23    End of Session Equipment Utilized During Treatment: Gait belt Activity Tolerance: Patient tolerated treatment well Patient left: in chair;with chair  alarm set;with call bell/phone within reach Nurse Communication: Mobility status PT Visit Diagnosis: Muscle weakness (generalized) (M62.81)    Time: 1340-1400 PT Time Calculation (min) (ACUTE ONLY): 20 min   Charges:   PT Evaluation $PT Eval Low Complexity: 1 Low            Staley Budzinski PT, DPT 02/03/2019, 2:19 PM

## 2019-02-03 NOTE — H&P (Signed)
Corey West is an 68 y.o. male.   Chief Complaint: Fever HPI: Patient with past medical history of cirrhosis, esophageal varices, gastric AVM, diabetes mellitus type 2, hypertension and chronic kidney disease presents to the emergency department due to fever.  The patient's temperature was 102.3 F upon arrival.  He admits to rigors and chills.  Oxygen saturations upon arrival of 78 to 80% on room air and he had increased work of breathing as well which prompted the emergency department staff to initiate BiPAP.  Chest x-ray showed possible interstitial edema versus atypical pneumonia.  Sepsis protocol initiated.  Novel coronavirus assay negative.  The patient's aspiratory effort improved and he was weaned from BiPAP prior to the emergency department staff called the hospitalist service for admission.  Past Medical History:  Diagnosis Date  . Anemia   . Anxiety    controlled;   . Arthritis   . AVM (arteriovenous malformation) of stomach, acquired with hemorrhage   . Barrett's esophagus   . Chronic kidney disease    renal infufficiency  . Cirrhosis ([place])   . Depression    controlled;   Marland Kitchen Diabetes mellitus without complication ([place])    not controlled, taking insulin but sugar continues to run high;   . Edema   . Esophageal varices ([place])   . GAVE (gastric antral vascular ectasia)   . GERD (gastroesophageal reflux disease)   . History of hiatal hernia   . Hyperlipidemia   . Hypertension    controlled well;   . Nephropathy, diabetic ([place])   . Obesity   . Pancytopenia ([place])   . Polyp, stomach    with chronic blood loss  . Sleep apnea    does not wear a cpap, Medicare would not pay for it   . Venous stasis dermatitis of both lower extremities   . Venous stasis of both lower extremities    with cellulitis    Past Surgical History:  Procedure Laterality Date  . ESOPHAGOGASTRODUODENOSCOPY N/A 02/09/2015   Procedure: ESOPHAGOGASTRODUODENOSCOPY (EGD);  Surgeon: Manya Silvas, MD;   Location: [facility] ENDOSCOPY;  Service: Endoscopy;  Laterality: N/A;  . ESOPHAGOGASTRODUODENOSCOPY N/A 07/22/2015   Procedure: ESOPHAGOGASTRODUODENOSCOPY (EGD);  Surgeon: Manya Silvas, MD;  Location: [facility] ENDOSCOPY;  Service: Endoscopy;  Laterality: N/A;  . ESOPHAGOGASTRODUODENOSCOPY N/A 06/26/2017   Procedure: ESOPHAGOGASTRODUODENOSCOPY (EGD);  Surgeon: Manya Silvas, MD;  Location: [facility] ENDOSCOPY;  Service: Endoscopy;  Laterality: N/A;  . ESOPHAGOGASTRODUODENOSCOPY N/A 06/27/2017   Procedure: ESOPHAGOGASTRODUODENOSCOPY (EGD);  Surgeon: Manya Silvas, MD;  Location: [facility] ENDOSCOPY;  Service: Endoscopy;  Laterality: N/A;  . ESOPHAGOGASTRODUODENOSCOPY N/A 06/28/2017   Procedure: ESOPHAGOGASTRODUODENOSCOPY (EGD);  Surgeon: Manya Silvas, MD;  Location: [facility] ENDOSCOPY;  Service: Endoscopy;  Laterality: N/A;  . ESOPHAGOGASTRODUODENOSCOPY Bilateral 10/11/2017   Procedure: ESOPHAGOGASTRODUODENOSCOPY (EGD);  Surgeon: Manya Silvas, MD;  Location: [facility] ENDOSCOPY;  Service: Endoscopy;  Laterality: Bilateral;  . ESOPHAGOGASTRODUODENOSCOPY N/A 12/04/2017   Procedure: ESOPHAGOGASTRODUODENOSCOPY (EGD);  Surgeon: Manya Silvas, MD;  Location: [facility] ENDOSCOPY;  Service: Endoscopy;  Laterality: N/A;  . ESOPHAGOGASTRODUODENOSCOPY (EGD) WITH PROPOFOL N/A 07/20/2015   Procedure: ESOPHAGOGASTRODUODENOSCOPY (EGD) WITH PROPOFOL;  Surgeon: Manya Silvas, MD;  Location: [facility] ENDOSCOPY;  Service: Endoscopy;  Laterality: N/A;  . ESOPHAGOGASTRODUODENOSCOPY (EGD) WITH PROPOFOL N/A 09/16/2015   Procedure: ESOPHAGOGASTRODUODENOSCOPY (EGD) WITH PROPOFOL;  Surgeon: Manya Silvas, MD;  Location: [facility] ENDOSCOPY;  Service: Endoscopy;  Laterality: N/A;  . ESOPHAGOGASTRODUODENOSCOPY (EGD) WITH PROPOFOL N/A 03/16/2016   Procedure: ESOPHAGOGASTRODUODENOSCOPY (EGD) WITH PROPOFOL;  Surgeon: Herbie Baltimore  Federico Flake, MD;  Location: [facility] ENDOSCOPY;  Service: Endoscopy;  Laterality: N/A;  . ESOPHAGOGASTRODUODENOSCOPY (EGD) WITH  PROPOFOL N/A 09/14/2016   Procedure: ESOPHAGOGASTRODUODENOSCOPY (EGD) WITH PROPOFOL;  Surgeon: Manya Silvas, MD;  Location: [facility] ENDOSCOPY;  Service: Endoscopy;  Laterality: N/A;  . ESOPHAGOGASTRODUODENOSCOPY (EGD) WITH PROPOFOL N/A 11/05/2016   Procedure: ESOPHAGOGASTRODUODENOSCOPY (EGD) WITH PROPOFOL;  Surgeon: Manya Silvas, MD;  Location: [facility] ENDOSCOPY;  Service: Endoscopy;  Laterality: N/A;  . ESOPHAGOGASTRODUODENOSCOPY (EGD) WITH PROPOFOL N/A 02/06/2017   Procedure: ESOPHAGOGASTRODUODENOSCOPY (EGD) WITH PROPOFOL;  Surgeon: Manya Silvas, MD;  Location: [facility] ENDOSCOPY;  Service: Endoscopy;  Laterality: N/A;  . ESOPHAGOGASTRODUODENOSCOPY (EGD) WITH PROPOFOL N/A 04/24/2017   Procedure: ESOPHAGOGASTRODUODENOSCOPY (EGD) WITH PROPOFOL;  Surgeon: Manya Silvas, MD;  Location: [facility] ENDOSCOPY;  Service: Endoscopy;  Laterality: N/A;  . ESOPHAGOGASTRODUODENOSCOPY (EGD) WITH PROPOFOL N/A 07/31/2017   Procedure: ESOPHAGOGASTRODUODENOSCOPY (EGD) WITH PROPOFOL;  Surgeon: Manya Silvas, MD;  Location: [facility] ENDOSCOPY;  Service: Endoscopy;  Laterality: N/A;  . ESOPHAGOGASTRODUODENOSCOPY (EGD) WITH PROPOFOL N/A 08/08/2018   Procedure: ESOPHAGOGASTRODUODENOSCOPY (EGD) WITH PROPOFOL;  Surgeon: Manya Silvas, MD;  Location: [facility] ENDOSCOPY;  Service: Endoscopy;  Laterality: N/A;  . GIVENS CAPSULE STUDY N/A 12/05/2017   Procedure: GIVENS CAPSULE STUDY;  Surgeon: Manya Silvas, MD;  Location: [facility] ENDOSCOPY;  Service: Endoscopy;  Laterality: N/A;  . TONSILLECTOMY    . TONSILLECTOMY AND ADENOIDECTOMY    . ULNAR NERVE TRANSPOSITION    . UVULOPALATOPHARYNGOPLASTY      Family History  Problem Relation Age of Onset  . Diabetes Other   . Transient ischemic attack Father   . CAD Father    Social History:  reports that he has been smoking cigars. He has smoked for the past 20.00 years. He has never used smokeless tobacco. He reports that he does not drink alcohol or use drugs.  Allergies: No  Known Allergies  Medications Prior to Admission  Medication Sig Dispense Refill  . dextromethorphan (DELSYM) 30 MG/5ML liquid Take 30 mg by mouth 2 (two) times daily.    . famotidine (PEPCID) 20 MG tablet Take 20 mg by mouth 2 (two) times daily.    . ferrous sulfate 325 (65 FE) MG tablet Take 325 mg by mouth 2 (two) times daily with a meal.     . furosemide (LASIX) 40 MG tablet Take 1 tablet by mouth 2 (two) times a day.    Marland Kitchen HYDROcodone-acetaminophen (NORCO/VICODIN) 5-325 MG tablet Take 1 tablet by mouth every 6 (six) hours as needed for severe pain. 20 tablet 0  . insulin aspart protamine - aspart (NOVOLOG 70/30 MIX) (70-30) 100 UNIT/ML FlexPen Inject 85 Units into the skin daily with breakfast.     . insulin aspart protamine - aspart (NOVOLOG 70/30 MIX) (70-30) 100 UNIT/ML FlexPen Inject 72 Units into the skin at bedtime.     Marland Kitchen lactulose (CHRONULAC) 10 GM/15ML solution     . loperamide (IMODIUM) 2 MG capsule Take 2 mg by mouth 3 (three) times daily.    Marland Kitchen LORazepam (ATIVAN) 0.5 MG tablet Take 1 tablet (0.5 mg total) by mouth every 8 (eight) hours as needed for anxiety. 20 tablet 0  . Multiple Vitamin (MULTIVITAMIN) tablet Take 1 tablet by mouth daily.    Marland Kitchen omeprazole (PRILOSEC) 40 MG capsule Take 40 mg by mouth daily.    . Potassium Chloride ER 20 MEQ TBCR Take 20 mEq by mouth 2 (two) times daily. While on lasix' 30 tablet 1  . potassium chloride SA (K-DUR)  20 MEQ tablet Take 2 tablets by mouth 2 (two) times a day.    . ranitidine (ZANTAC) 150 MG tablet Take 1 tablet by mouth 1 day or 1 dose.    . rifaximin (XIFAXAN) 550 MG TABS tablet Take 1 tablet (550 mg total) by mouth 2 (two) times daily. 60 tablet 0  . traMADol (ULTRAM) 50 MG tablet Take 50 mg by mouth 3 (three) times daily as needed.    Marland Kitchen ULTICARE MINI PEN NEEDLES 31G X 6 MM MISC     . vitamin C (ASCORBIC ACID) 250 MG tablet Take 500 mg by mouth 2 (two) times daily.    Marland Kitchen zinc sulfate 220 (50 Zn) MG capsule Take 220 mg by mouth daily.       Results for orders placed or performed during the hospital encounter of 02/02/19 (from the past 48 hour(s))  Lactic acid, plasma     Status: Abnormal   Collection Time: 02/03/19 12:05 AM  Result Value Ref Range   Lactic Acid, Venous 2.1 (HH) 0.5 - 1.9 mmol/L    Comment: CRITICAL RESULT CALLED TO, READ BACK BY AND VERIFIED WITH BUTCH WOODS AT [phone] ON 02/03/2019 JJB Performed at [facility], [address] [place], [place] [address]   Comprehensive metabolic panel     Status: Abnormal   Collection Time: 02/03/19 12:05 AM  Result Value Ref Range   Sodium 141 135 - 145 mmol/L   Potassium 3.7 3.5 - 5.1 mmol/L   Chloride 111 98 - 111 mmol/L   CO2 22 22 - 32 mmol/L   Glucose, Bld 125 (H) 70 - 99 mg/dL   BUN 28 (H) 8 - 23 mg/dL   Creatinine, Ser 1.97 (H) 0.61 - 1.24 mg/dL   Calcium 8.8 (L) 8.9 - 10.3 mg/dL   Total Protein 6.6 6.5 - 8.1 g/dL   Albumin 3.1 (L) 3.5 - 5.0 g/dL   AST 32 15 - 41 U/L   ALT 27 0 - 44 U/L   Alkaline Phosphatase 83 38 - 126 U/L   Total Bilirubin 0.5 0.3 - 1.2 mg/dL   GFR calc non Af Amer 34 (L) >60 mL/min   GFR calc Af Amer 40 (L) >60 mL/min   Anion gap 8 5 - 15    Comment: Performed at [facility], [address] [place], [place] [address]  CBC WITH DIFFERENTIAL     Status: Abnormal   Collection Time: 02/03/19 12:05 AM  Result Value Ref Range   WBC 8.9 4.0 - 10.5 K/uL   RBC 2.67 (L) 4.22 - 5.81 MIL/uL   Hemoglobin 8.2 (L) 13.0 - 17.0 g/dL   HCT 24.8 (L) 39.0 - 52.0 %   MCV 92.9 80.0 - 100.0 fL   MCH 30.7 26.0 - 34.0 pg   MCHC 33.1 30.0 - 36.0 g/dL   RDW 16.3 (H) 11.5 - 15.5 %   Platelets 97 (L) 150 - 400 K/uL    Comment: PLATELET COUNT CONFIRMED BY SMEAR Immature Platelet Fraction may be clinically indicated, consider ordering this additional test [id number]    nRBC 0.0 0.0 - 0.2 %   Neutrophils Relative % 87 %   Neutro Abs 7.7 1.7 - 7.7 K/uL   Lymphocytes Relative 6 %   Lymphs Abs 0.5 (L) 0.7 - 4.0 K/uL   Monocytes  Relative 5 %   Monocytes Absolute 0.4 0.1 - 1.0 K/uL   Eosinophils Relative 2 %   Eosinophils Absolute 0.2 0.0 - 0.5 K/uL  Basophils Relative 0 %   Basophils Absolute 0.0 0.0 - 0.1 K/uL   Immature Granulocytes 0 %   Abs Immature Granulocytes 0.03 0.00 - 0.07 K/uL    Comment: Performed at [facility], [address] [place], [place] [address]  SARS Coronavirus 2 (CEPHEID- Performed in [facility] hospital lab), Hosp Order     Status: None   Collection Time: 02/03/19 12:05 AM  Result Value Ref Range   SARS Coronavirus 2 NEGATIVE NEGATIVE    Comment: (NOTE) If result is NEGATIVE SARS-CoV-2 target nucleic acids are NOT DETECTED. The SARS-CoV-2 RNA is generally detectable in upper and lower  respiratory specimens during the acute phase of infection. The lowest  concentration of SARS-CoV-2 viral copies this assay can detect is 250  copies / mL. A negative result does not preclude SARS-CoV-2 infection  and should not be used as the sole basis for treatment or other  patient management decisions.  A negative result may occur with  improper specimen collection / handling, submission of specimen other  than nasopharyngeal swab, presence of viral mutation(s) within the  areas targeted by this assay, and inadequate number of viral copies  (<250 copies / mL). A negative result must be combined with clinical  observations, patient history, and epidemiological information. If result is POSITIVE SARS-CoV-2 target nucleic acids are DETECTED. The SARS-CoV-2 RNA is generally detectable in upper and lower  respiratory specimens dur ing the acute phase of infection.  Positive  results are indicative of active infection with SARS-CoV-2.  Clinical  correlation with patient history and other diagnostic information is  necessary to determine patient infection status.  Positive results do  not rule out bacterial infection or co-infection with other viruses. If result is PRESUMPTIVE  POSTIVE SARS-CoV-2 nucleic acids MAY BE PRESENT.   A presumptive positive result was obtained on the submitted specimen  and confirmed on repeat testing.  While 2019 novel coronavirus  (SARS-CoV-2) nucleic acids may be present in the submitted sample  additional confirmatory testing may be necessary for epidemiological  and / or clinical management purposes  to differentiate between  SARS-CoV-2 and other Sarbecovirus currently known to infect humans.  If clinically indicated additional testing with an alternate test  methodology [phone]) is advised. The SARS-CoV-2 RNA is generally  detectable in upper and lower respiratory sp ecimens during the acute  phase of infection. The expected result is Negative. Fact Sheet for Patients:  StrictlyIdeas.no Fact Sheet for Healthcare Providers: BankingDealers.co.za This test is not yet approved or cleared by the [place] FDA and has been authorized for detection and/or diagnosis of SARS-CoV-2 by FDA under an Emergency Use Authorization (EUA).  This EUA will remain in effect (meaning this test can be used) for the duration of the COVID-19 declaration under Section 564(b)(1) of the Act, 21 U.S.C. section 360bbb-3(b)(1), unless the authorization is terminated or revoked sooner. Performed at [facility], [address] [place], [place] [address]   Ammonia     Status: Abnormal   Collection Time: 02/03/19 12:25 AM  Result Value Ref Range   Ammonia 86 (H) 9 - 35 umol/L    Comment: Performed at [facility], [place], [place] [address]  Lactic acid, plasma     Status: None   Collection Time: 02/03/19  2:20 AM  Result Value Ref Range   Lactic Acid, Venous 1.2 0.5 - 1.9 mmol/L    Comment: Performed at [facility], [address] [place], [place]  [address]   Dg Chest Port 1 View  Result Date: 02/03/2019 CLINICAL DATA:  68 y/o  M; shortness of breath,  fever, nausea. EXAM: PORTABLE CHEST 1 VIEW COMPARISON:  01/14/2019 chest radiograph. FINDINGS: Stable cardiomegaly given projection and technique. Low lung volumes. Increased reticular opacities. No consolidation, effusion, or pneumothorax. Bones are unremarkable. IMPRESSION: Low lung volumes. Cardiomegaly. Increased reticular opacities may represent interstitial edema or atypical pneumonia. Electronically Signed   By: Kristine Garbe M.D.   On: 02/03/2019 00:53    Review of Systems  Constitutional: Positive for chills and fever.  HENT: Negative for sore throat and tinnitus.   Eyes: Negative for blurred vision and redness.  Respiratory: Negative for cough and shortness of breath.   Cardiovascular: Negative for chest pain, palpitations, orthopnea and PND.  Gastrointestinal: Negative for abdominal pain, diarrhea, nausea and vomiting.  Genitourinary: Negative for dysuria, frequency and urgency.  Musculoskeletal: Negative for joint pain and myalgias.  Skin: Negative for rash.       No lesions  Neurological: Positive for weakness. Negative for speech change and focal weakness.  Endo/Heme/Allergies: Does not bruise/bleed easily.       No temperature intolerance  Psychiatric/Behavioral: Negative for depression and suicidal ideas.    Blood pressure (!) 175/66, pulse 88, temperature 99.8 F (37.7 C), temperature source Oral, resp. rate 20, weight 125.6 kg, SpO2 100 %. Physical Exam  Vitals reviewed. Constitutional: He is oriented to person, place, and time. He appears well-developed and well-nourished. No distress.  HENT:  Head: Normocephalic and atraumatic.  Mouth/Throat: Oropharynx is clear and moist.  Eyes: Pupils are equal, round, and reactive to light. Conjunctivae and EOM are normal. No scleral icterus.  Neck: Normal range of motion. Neck supple. No JVD present. No tracheal deviation present. No thyromegaly present.  Cardiovascular: Normal rate, regular rhythm and normal heart  sounds. Exam reveals no gallop and no friction rub.  No murmur heard. Respiratory: Effort normal and breath sounds normal.  GI: Soft. Bowel sounds are normal. He exhibits no distension. There is no abdominal tenderness.  Genitourinary:    Genitourinary Comments: Deferred   Musculoskeletal: Normal range of motion.        General: No edema.  Lymphadenopathy:    He has no cervical adenopathy.  Neurological: He is alert and oriented to person, place, and time. No cranial nerve deficit.  Skin: Skin is warm and dry. No rash noted. No erythema.  Psychiatric: He has a normal mood and affect. His behavior is normal. Judgment and thought content normal.     Assessment/Plan This is a 68 year old male admitted for pneumonia. 1.  Pneumonia: Healthcare associated; continue vancomycin and cefepime.  (The patient was recently admitted to the hospital for rule out of stroke and subsequently to skilled nursing facility for rehab of general debilitation).  Supplemental oxygen as needed. 2.  Sepsis: The patient meets criteria via fever, tachypnea elevated lactic acid.  He is hemodynamically stable.  Follow blood cultures for growth and sensitivities. 3.  Diabetes mellitus type 2: The patient is on large doses of mixed insulin twice a day.  Continue basal insulin therapy adjusted for hospital diet.  Sliding scale insulin as well for supplemental coverage. 4.  Chronic kidney disease: Stage III.  Hydrate with intravenous fluid.  Avoid nephrotoxic agents. 5.  Cirrhosis: Continue rifaximin 6.  Chronic venous stasis of lower extremities: Continue Lasix per home regimen 7.  DVT prophylaxis: Heparin 8.  GI prophylaxis: GI per home regimen The patient is a full code.  Time spent on admission orders and patient care approximately 45 minutes  Harrie Foreman, MD 02/03/2019, 3:42 AM

## 2019-02-03 NOTE — TOC Initial Note (Signed)
Transition of Care [facility] - Initial/Assessment Note    Patient Details  Name: Corey West MRN: [id number] Date of Birth: 06/17/1951  Transition of Care [facility]) CM/SW Contact:    Annamaria Boots, [place] Phone Number: 02/03/2019, 2:49 PM  Clinical Narrative: Patient is at high risk for readmission. CSW met with patient to discuss discharge planning. Patient reports that he lives alone and is independent. Patient states that he has all the equipment he needs at home and does not need any services. CSW explained home health and patient states that he does not need home health because it is a "waste of resources". CSW found through chart review that patient has been followed by [facility] in the past. Patient states that he has no needs. CSW will sign off. Please re consult if further needs arise.                   Expected Discharge Plan: [place] Barriers to Discharge: Continued Medical Work up   Patient Goals and CMS Choice Patient states their goals for this hospitalization and ongoing recovery are:: " I just want to get better and go home"       Expected Discharge Plan and Services Expected Discharge Plan: [place]       Living arrangements for the past 2 months: Single Family Home                                      Prior Living Arrangements/Services Living arrangements for the past 2 months: Single Family Home Lives with:: Self Patient language and need for interpreter reviewed:: Yes        Need for Family Participation in Patient Care: No (Comment) Care giver support system in place?: No (comment)   Criminal Activity/Legal Involvement Pertinent to Current Situation/Hospitalization: No - Comment as needed  Activities of Daily Living Home Assistive Devices/Equipment: Eyeglasses, Cane (specify quad or straight), Walker (specify type) ADL Screening (condition at time of admission) Patient's cognitive ability adequate to safely  complete daily activities?: Yes Is the patient deaf or have difficulty hearing?: No Does the patient have difficulty seeing, even when wearing glasses/contacts?: No Does the patient have difficulty concentrating, remembering, or making decisions?: No Patient able to express need for assistance with ADLs?: Yes Does the patient have difficulty dressing or bathing?: No Independently performs ADLs?: Yes (appropriate for developmental age) Does the patient have difficulty walking or climbing stairs?: Yes Weakness of Legs: Both Weakness of Arms/Hands: None  Permission Sought/Granted                  Emotional Assessment Appearance:: Appears stated age   Affect (typically observed): Pleasant, Appropriate Orientation: : Oriented to Self, Oriented to Place, Oriented to  Time, Oriented to Situation Alcohol / Substance Use: Not Applicable Psych Involvement: No (comment)  Admission diagnosis:  Hepatic encephalopathy ([place]) [K72.90] Community acquired pneumonia, unspecified laterality [J18.9] Sepsis, due to unspecified organism, unspecified whether acute organ dysfunction present [facility]) [A41.9] Patient Active Problem List   Diagnosis Date Noted  . HCAP (healthcare-associated pneumonia) 02/03/2019  . Aphasia 01/14/2019  . Acute on chronic renal failure ([place]) 01/14/2019  . Malnutrition of moderate degree 12/03/2018  . C. difficile diarrhea 12/02/2018  . Ankle fracture, left, closed, initial encounter 10/15/2018  . Ankle fracture, right, closed, initial encounter 10/15/2018  . Ankle fracture 10/15/2018  . Pressure injury  of skin 07/19/2018  . UTI (urinary tract infection) 07/18/2018  . CKD (chronic kidney disease) stage 3, GFR 30-59 ml/min (HCC) 01/02/2018  . 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 Goals of care, counseling/discussion   . Palliative care encounter   . Hepatic encephalopathy syndrome ([place]) 12/16/2017  . Hepatic encephalopathy ([place]) 10/18/2017  . Hypokalemia  10/09/2017  . Hyperglycemia 10/09/2017  . Symptomatic anemia 06/26/2017  . Sepsis ([place]) 06/12/2017  . Type 2 diabetes mellitus with diabetic nephropathy, with long-term current use of insulin ([place]) 05/29/2017  . Cellulitis in diabetic foot ([place]) 02/21/2017  . Cellulitis 02/21/2017  . GI bleed 07/20/2015  . Hematemesis 02/08/2015  . Cirrhosis ([place]) 02/08/2015  . Esophageal varices ([place]) 02/08/2015  . Gastroesophageal reflux disease 02/08/2015  . Hyperlipidemia 02/08/2015  . Chronic venous stasis dermatitis of both lower extremities 10/06/2014  . Edema 05/25/2014  . Hypertension 05/25/2014  . Obesity 05/25/2014  . Sleep apnea 05/25/2014  . Barrett's esophagus 05/25/2014  . Pancytopenia ([place]) 05/25/2014  . Iron deficiency anemia 12/04/2013  . Thrombocytopenia ([place]) 12/04/2013  . Splenomegaly 11/04/2013   PCP:  Baxter Hire, MD Pharmacy:   [place], [place] - [address] [place] [address] Phone: [phone] Fax: [phone]     Social Determinants of Health (SDOH) Interventions    Readmission Risk Interventions Readmission Risk Prevention Plan 01/15/2019  Transportation Screening Complete  HRI or Home Care Consult Complete  Medication Review (RN Care Manager) Complete  Some recent data might be hidden

## 2019-02-03 NOTE — ED Notes (Signed)
ED TO INPATIENT HANDOFF REPORT  ED Nurse Name and Phone #: Daiva Nakayama, [place]  S Name/Age/Gender [name] 68 y.o. male Room/Bed: ED01A/ED01A  Code Status   Code Status: Prior  Home/SNF/Other Home Patient oriented to: self, place and situation Is this baseline? Yes   Triage Complete: Triage complete  Chief Complaint Nausea; Fever; Shortness of Breath   Triage Note Pt in with co shob and fever since today with nausea. IV per ems, given zofran 4 mg IV, initial sats per ems 78-80% on RA.    Allergies No Known Allergies  Level of Care/Admitting Diagnosis ED Disposition    ED Disposition Condition [facility] Area: [place] [[id number]]  Level of Care: Med-Surg [16]  Covid Evaluation: Screening Protocol (No Symptoms)  Diagnosis: HCAP (healthcare-associated pneumonia) [416384]  Admitting Physician: Harrie Foreman [[id number]]  Attending Physician: Harrie Foreman [[id number]]  Estimated length of stay: past midnight tomorrow  Certification:: I certify this patient will need inpatient services for at least 2 midnights  PT Class (Do Not Modify): Inpatient [101]  PT Acc Code (Do Not Modify): Private [1]       B Medical/Surgery History Past Medical History:  Diagnosis Date  . Anemia   . Anxiety    controlled;   . Arthritis   . AVM (arteriovenous malformation) of stomach, acquired with hemorrhage   . Barrett's esophagus   . Chronic kidney disease    renal infufficiency  . Cirrhosis ([place])   . Depression    controlled;   Marland Kitchen Diabetes mellitus without complication ([place])    not controlled, taking insulin but sugar continues to run high;   . Edema   . Esophageal varices ([place])   . GAVE (gastric antral vascular ectasia)   . GERD (gastroesophageal reflux disease)   . History of hiatal hernia   . Hyperlipidemia   . Hypertension    controlled well;   . Nephropathy, diabetic ([place])   . Obesity   . Pancytopenia ([place])   . Polyp,  stomach    with chronic blood loss  . Sleep apnea    does not wear a cpap, Medicare would not pay for it   . Venous stasis dermatitis of both lower extremities   . Venous stasis of both lower extremities    with cellulitis   Past Surgical History:  Procedure Laterality Date  . ESOPHAGOGASTRODUODENOSCOPY N/A 02/09/2015   Procedure: ESOPHAGOGASTRODUODENOSCOPY (EGD);  Surgeon: Manya Silvas, MD;  Location: [facility] ENDOSCOPY;  Service: Endoscopy;  Laterality: N/A;  . ESOPHAGOGASTRODUODENOSCOPY N/A 07/22/2015   Procedure: ESOPHAGOGASTRODUODENOSCOPY (EGD);  Surgeon: Manya Silvas, MD;  Location: [facility] ENDOSCOPY;  Service: Endoscopy;  Laterality: N/A;  . ESOPHAGOGASTRODUODENOSCOPY N/A 06/26/2017   Procedure: ESOPHAGOGASTRODUODENOSCOPY (EGD);  Surgeon: Manya Silvas, MD;  Location: [facility] ENDOSCOPY;  Service: Endoscopy;  Laterality: N/A;  . ESOPHAGOGASTRODUODENOSCOPY N/A 06/27/2017   Procedure: ESOPHAGOGASTRODUODENOSCOPY (EGD);  Surgeon: Manya Silvas, MD;  Location: [facility] ENDOSCOPY;  Service: Endoscopy;  Laterality: N/A;  . ESOPHAGOGASTRODUODENOSCOPY N/A 06/28/2017   Procedure: ESOPHAGOGASTRODUODENOSCOPY (EGD);  Surgeon: Manya Silvas, MD;  Location: [facility] ENDOSCOPY;  Service: Endoscopy;  Laterality: N/A;  . ESOPHAGOGASTRODUODENOSCOPY Bilateral 10/11/2017   Procedure: ESOPHAGOGASTRODUODENOSCOPY (EGD);  Surgeon: Manya Silvas, MD;  Location: [facility] ENDOSCOPY;  Service: Endoscopy;  Laterality: Bilateral;  . ESOPHAGOGASTRODUODENOSCOPY N/A 12/04/2017   Procedure: ESOPHAGOGASTRODUODENOSCOPY (EGD);  Surgeon: Manya Silvas, MD;  Location: [facility] ENDOSCOPY;  Service: Endoscopy;  Laterality: N/A;  . ESOPHAGOGASTRODUODENOSCOPY (EGD) WITH PROPOFOL N/A  07/20/2015   Procedure: ESOPHAGOGASTRODUODENOSCOPY (EGD) WITH PROPOFOL;  Surgeon: Manya Silvas, MD;  Location: [facility] ENDOSCOPY;  Service: Endoscopy;  Laterality: N/A;  . ESOPHAGOGASTRODUODENOSCOPY (EGD) WITH PROPOFOL N/A 09/16/2015   Procedure:  ESOPHAGOGASTRODUODENOSCOPY (EGD) WITH PROPOFOL;  Surgeon: Manya Silvas, MD;  Location: [facility] ENDOSCOPY;  Service: Endoscopy;  Laterality: N/A;  . ESOPHAGOGASTRODUODENOSCOPY (EGD) WITH PROPOFOL N/A 03/16/2016   Procedure: ESOPHAGOGASTRODUODENOSCOPY (EGD) WITH PROPOFOL;  Surgeon: Manya Silvas, MD;  Location: [facility] ENDOSCOPY;  Service: Endoscopy;  Laterality: N/A;  . ESOPHAGOGASTRODUODENOSCOPY (EGD) WITH PROPOFOL N/A 09/14/2016   Procedure: ESOPHAGOGASTRODUODENOSCOPY (EGD) WITH PROPOFOL;  Surgeon: Manya Silvas, MD;  Location: [facility] ENDOSCOPY;  Service: Endoscopy;  Laterality: N/A;  . ESOPHAGOGASTRODUODENOSCOPY (EGD) WITH PROPOFOL N/A 11/05/2016   Procedure: ESOPHAGOGASTRODUODENOSCOPY (EGD) WITH PROPOFOL;  Surgeon: Manya Silvas, MD;  Location: [facility] ENDOSCOPY;  Service: Endoscopy;  Laterality: N/A;  . ESOPHAGOGASTRODUODENOSCOPY (EGD) WITH PROPOFOL N/A 02/06/2017   Procedure: ESOPHAGOGASTRODUODENOSCOPY (EGD) WITH PROPOFOL;  Surgeon: Manya Silvas, MD;  Location: [facility] ENDOSCOPY;  Service: Endoscopy;  Laterality: N/A;  . ESOPHAGOGASTRODUODENOSCOPY (EGD) WITH PROPOFOL N/A 04/24/2017   Procedure: ESOPHAGOGASTRODUODENOSCOPY (EGD) WITH PROPOFOL;  Surgeon: Manya Silvas, MD;  Location: [facility] ENDOSCOPY;  Service: Endoscopy;  Laterality: N/A;  . ESOPHAGOGASTRODUODENOSCOPY (EGD) WITH PROPOFOL N/A 07/31/2017   Procedure: ESOPHAGOGASTRODUODENOSCOPY (EGD) WITH PROPOFOL;  Surgeon: Manya Silvas, MD;  Location: [facility] ENDOSCOPY;  Service: Endoscopy;  Laterality: N/A;  . ESOPHAGOGASTRODUODENOSCOPY (EGD) WITH PROPOFOL N/A 08/08/2018   Procedure: ESOPHAGOGASTRODUODENOSCOPY (EGD) WITH PROPOFOL;  Surgeon: Manya Silvas, MD;  Location: [facility] ENDOSCOPY;  Service: Endoscopy;  Laterality: N/A;  . GIVENS CAPSULE STUDY N/A 12/05/2017   Procedure: GIVENS CAPSULE STUDY;  Surgeon: Manya Silvas, MD;  Location: [facility] ENDOSCOPY;  Service: Endoscopy;  Laterality: N/A;  . TONSILLECTOMY    . TONSILLECTOMY AND  ADENOIDECTOMY    . ULNAR NERVE TRANSPOSITION    . UVULOPALATOPHARYNGOPLASTY       A IV Location/Drains/Wounds Patient Lines/Drains/Airways Status   Active Line/Drains/Airways    Name:   Placement date:   Placement time:   Site:   Days:   Peripheral IV 02/03/19 Right Forearm   02/03/19    0000    Forearm   less than 1   Peripheral IV 02/03/19 Left Forearm   02/03/19    0040    Forearm   less than 1   Wound / Incision (Open or Dehisced) 01/15/19 Other (Comment) Foot Anterior;Right Great toe nail missing   01/15/19    0129    Foot   19   Wound / Incision (Open or Dehisced) 01/15/19 Venous stasis ulcer Leg Right Scabbed, white unopened area   01/15/19    0131    Leg   19   Wound / Incision (Open or Dehisced) 01/15/19 Venous stasis ulcer Leg Left white unopened area   01/15/19    0232    Leg   19          Intake/Output Last 24 hours  Intake/Output Summary (Last 24 hours) at 02/03/2019 0408 Last data filed at 02/03/2019 3762 Gross per 24 hour  Intake 600 ml  Output -  Net 600 ml    Labs/Imaging Results for orders placed or performed during the hospital encounter of 02/02/19 (from the past 48 hour(s))  Lactic acid, plasma     Status: Abnormal   Collection Time: 02/03/19 12:05 AM  Result Value Ref Range   Lactic Acid, Venous 2.1 (HH) 0.5 - 1.9 mmol/L    Comment:  CRITICAL RESULT CALLED TO, READ BACK BY AND VERIFIED WITH BUTCH WOODS AT [phone] ON 02/03/2019 JJB Performed at [facility], [address] [place], [place] [address]   Comprehensive metabolic panel     Status: Abnormal   Collection Time: 02/03/19 12:05 AM  Result Value Ref Range   Sodium 141 135 - 145 mmol/L   Potassium 3.7 3.5 - 5.1 mmol/L   Chloride 111 98 - 111 mmol/L   CO2 22 22 - 32 mmol/L   Glucose, Bld 125 (H) 70 - 99 mg/dL   BUN 28 (H) 8 - 23 mg/dL   Creatinine, Ser 1.97 (H) 0.61 - 1.24 mg/dL   Calcium 8.8 (L) 8.9 - 10.3 mg/dL   Total Protein 6.6 6.5 - 8.1 g/dL   Albumin 3.1 (L) 3.5 - 5.0 g/dL   AST 32  15 - 41 U/L   ALT 27 0 - 44 U/L   Alkaline Phosphatase 83 38 - 126 U/L   Total Bilirubin 0.5 0.3 - 1.2 mg/dL   GFR calc non Af Amer 34 (L) >60 mL/min   GFR calc Af Amer 40 (L) >60 mL/min   Anion gap 8 5 - 15    Comment: Performed at [facility], [address] [place], [place] [address]  CBC WITH DIFFERENTIAL     Status: Abnormal   Collection Time: 02/03/19 12:05 AM  Result Value Ref Range   WBC 8.9 4.0 - 10.5 K/uL   RBC 2.67 (L) 4.22 - 5.81 MIL/uL   Hemoglobin 8.2 (L) 13.0 - 17.0 g/dL   HCT 24.8 (L) 39.0 - 52.0 %   MCV 92.9 80.0 - 100.0 fL   MCH 30.7 26.0 - 34.0 pg   MCHC 33.1 30.0 - 36.0 g/dL   RDW 16.3 (H) 11.5 - 15.5 %   Platelets 97 (L) 150 - 400 K/uL    Comment: PLATELET COUNT CONFIRMED BY SMEAR Immature Platelet Fraction may be clinically indicated, consider ordering this additional test [id number]    nRBC 0.0 0.0 - 0.2 %   Neutrophils Relative % 87 %   Neutro Abs 7.7 1.7 - 7.7 K/uL   Lymphocytes Relative 6 %   Lymphs Abs 0.5 (L) 0.7 - 4.0 K/uL   Monocytes Relative 5 %   Monocytes Absolute 0.4 0.1 - 1.0 K/uL   Eosinophils Relative 2 %   Eosinophils Absolute 0.2 0.0 - 0.5 K/uL   Basophils Relative 0 %   Basophils Absolute 0.0 0.0 - 0.1 K/uL   Immature Granulocytes 0 %   Abs Immature Granulocytes 0.03 0.00 - 0.07 K/uL    Comment: Performed at [facility], [address], [place], [place] [address]  SARS Coronavirus 2 (CEPHEID- Performed in [place] hospital lab), Hosp Order     Status: None   Collection Time: 02/03/19 12:05 AM  Result Value Ref Range   SARS Coronavirus 2 NEGATIVE NEGATIVE    Comment: (NOTE) If result is NEGATIVE SARS-CoV-2 target nucleic acids are NOT DETECTED. The SARS-CoV-2 RNA is generally detectable in upper and lower  respiratory specimens during the acute phase of infection. The lowest  concentration of SARS-CoV-2 viral copies this assay can detect is 250  copies / mL. A negative result does not preclude SARS-CoV-2  infection  and should not be used as the sole basis for treatment or other  patient management decisions.  A negative result may occur with  improper specimen collection / handling, submission of specimen other  than nasopharyngeal swab, presence of  viral mutation(s) within the  areas targeted by this assay, and inadequate number of viral copies  (<250 copies / mL). A negative result must be combined with clinical  observations, patient history, and epidemiological information. If result is POSITIVE SARS-CoV-2 target nucleic acids are DETECTED. The SARS-CoV-2 RNA is generally detectable in upper and lower  respiratory specimens dur ing the acute phase of infection.  Positive  results are indicative of active infection with SARS-CoV-2.  Clinical  correlation with patient history and other diagnostic information is  necessary to determine patient infection status.  Positive results do  not rule out bacterial infection or co-infection with other viruses. If result is PRESUMPTIVE POSTIVE SARS-CoV-2 nucleic acids MAY BE PRESENT.   A presumptive positive result was obtained on the submitted specimen  and confirmed on repeat testing.  While 2019 novel coronavirus  (SARS-CoV-2) nucleic acids may be present in the submitted sample  additional confirmatory testing may be necessary for epidemiological  and / or clinical management purposes  to differentiate between  SARS-CoV-2 and other Sarbecovirus currently known to infect humans.  If clinically indicated additional testing with an alternate test  methodology [phone]) is advised. The SARS-CoV-2 RNA is generally  detectable in upper and lower respiratory sp ecimens during the acute  phase of infection. The expected result is Negative. Fact Sheet for Patients:  StrictlyIdeas.no Fact Sheet for Healthcare Providers: BankingDealers.co.za This test is not yet approved or cleared by the [place]  FDA and has been authorized for detection and/or diagnosis of SARS-CoV-2 by FDA under an Emergency Use Authorization (EUA).  This EUA will remain in effect (meaning this test can be used) for the duration of the COVID-19 declaration under Section 564(b)(1) of the Act, 21 U.S.C. section 360bbb-3(b)(1), unless the authorization is terminated or revoked sooner. Performed at [facility], [address] [place], [place] [address]   Ammonia     Status: Abnormal   Collection Time: 02/03/19 12:25 AM  Result Value Ref Range   Ammonia 86 (H) 9 - 35 umol/L    Comment: Performed at [facility], [place], [place] [address]  Lactic acid, plasma     Status: None   Collection Time: 02/03/19  2:20 AM  Result Value Ref Range   Lactic Acid, Venous 1.2 0.5 - 1.9 mmol/L    Comment: Performed at [facility], [address], [place], [place] [address]   Dg Chest Port 1 View  Result Date: 02/03/2019 CLINICAL DATA:  68 y/o  M; shortness of breath, fever, nausea. EXAM: PORTABLE CHEST 1 VIEW COMPARISON:  01/14/2019 chest radiograph. FINDINGS: Stable cardiomegaly given projection and technique. Low lung volumes. Increased reticular opacities. No consolidation, effusion, or pneumothorax. Bones are unremarkable. IMPRESSION: Low lung volumes. Cardiomegaly. Increased reticular opacities may represent interstitial edema or atypical pneumonia. Electronically Signed   By: Kristine Garbe M.D.   On: 02/03/2019 00:53    Pending Labs Unresulted Labs (From admission, onward)    Start     Ordered   02/03/19 0012  Blood Culture (routine x 2)  BLOOD CULTURE X 2,   STAT     02/03/19 0012   02/03/19 0012  Urinalysis, Routine w reflex microscopic  ONCE - STAT,   STAT     02/03/19 0012   Signed and Held  TSH  Add-on,   R     Signed and Held          Vitals/Pain Today's Vitals   02/03/19 0045  02/03/19 0215 02/03/19 0247 02/03/19 0319  BP: (!) 163/53 105/65 (!)  166/53 (!) 175/66  Pulse: 89 89 86 88  Resp: 18 20 18 20   Temp:  99.8 F (37.7 C)    TempSrc:  Oral    SpO2: 100% 99% 100% 100%  Weight:      PainSc:  0-No pain 0-No pain     Isolation Precautions Airborne and Contact precautions  Medications Medications  vancomycin (VANCOCIN) 1,250 mg in sodium chloride 0.9 % 250 mL IVPB (has no administration in time range)  ceFEPIme (MAXIPIME) 2 g in sodium chloride 0.9 % 100 mL IVPB (has no administration in time range)  metroNIDAZOLE (FLAGYL) IVPB 500 mg (has no administration in time range)  ceFEPIme (MAXIPIME) 2 g in sodium chloride 0.9 % 100 mL IVPB (0 g Intravenous Stopped 02/03/19 0119)  metroNIDAZOLE (FLAGYL) IVPB 500 mg (0 mg Intravenous Stopped 02/03/19 0141)  vancomycin (VANCOCIN) 2,000 mg in sodium chloride 0.9 % 500 mL IVPB (0 mg Intravenous Stopped 02/03/19 0336)  lactulose (CHRONULAC) 10 GM/15ML solution 30 g (30 g Oral Given 02/03/19 0245)    Mobility walks with person assist High fall risk   Focused Assessments    R Recommendations: See Admitting Provider Note  Report given to:   Additional Notes: None

## 2019-02-03 NOTE — Progress Notes (Signed)
Pt refusing MRSA PCR swab. Will try again later.

## 2019-02-04 LAB — COMPREHENSIVE METABOLIC PANEL
ALT: 28 U/L (ref 0–44)
AST: 35 U/L (ref 15–41)
Albumin: 2.7 g/dL — ABNORMAL LOW (ref 3.5–5.0)
Alkaline Phosphatase: 66 U/L (ref 38–126)
Anion gap: 6 (ref 5–15)
BUN: 33 mg/dL — ABNORMAL HIGH (ref 8–23)
CO2: 22 mmol/L (ref 22–32)
Calcium: 8.7 mg/dL — ABNORMAL LOW (ref 8.9–10.3)
Chloride: 115 mmol/L — ABNORMAL HIGH (ref 98–111)
Creatinine, Ser: 2.18 mg/dL — ABNORMAL HIGH (ref 0.61–1.24)
GFR calc Af Amer: 35 mL/min — ABNORMAL LOW (ref >60)
GFR calc non Af Amer: 30 mL/min — ABNORMAL LOW (ref >60)
Glucose, Bld: 151 mg/dL — ABNORMAL HIGH (ref 70–99)
Potassium: 4 mmol/L (ref 3.5–5.1)
Sodium: 143 mmol/L (ref 135–145)
Total Bilirubin: 1 mg/dL (ref 0.3–1.2)
Total Protein: 6.2 g/dL — ABNORMAL LOW (ref 6.5–8.1)

## 2019-02-04 LAB — URINALYSIS, ROUTINE W REFLEX MICROSCOPIC
Bilirubin Urine: NEGATIVE
Glucose, UA: 50 mg/dL — AB
Ketones, ur: NEGATIVE mg/dL
Nitrite: NEGATIVE
Protein, ur: 100 mg/dL — AB
Specific Gravity, Urine: 1.017 (ref 1.005–1.030)
pH: 5 (ref 5.0–8.0)

## 2019-02-04 LAB — BLOOD CULTURE ID PANEL (REFLEXED)

## 2019-02-04 LAB — GLUCOSE, CAPILLARY
Glucose-Capillary: 133 mg/dL — ABNORMAL HIGH (ref 70–99)
Glucose-Capillary: 159 mg/dL — ABNORMAL HIGH (ref 70–99)
Glucose-Capillary: 188 mg/dL — ABNORMAL HIGH (ref 70–99)
Glucose-Capillary: 167 mg/dL — ABNORMAL HIGH (ref 70–99)

## 2019-02-04 MED ORDER — FERROUS SULFATE 325 (65 FE) MG PO TABS
325.0000 mg | ORAL_TABLET | Freq: Two times a day (BID) | ORAL | Status: DC
  Administered 2019-02-04 – 2019-02-05 (×2): 325 mg via ORAL
  Filled 2019-02-04 (×2): qty 1

## 2019-02-04 MED ORDER — VITAMIN C 500 MG PO TABS
500.0000 mg | ORAL_TABLET | Freq: Two times a day (BID) | ORAL | Status: DC
  Administered 2019-02-04 – 2019-02-05 (×2): 500 mg via ORAL
  Filled 2019-02-04 (×2): qty 1

## 2019-02-04 MED ORDER — LORAZEPAM 0.5 MG PO TABS
0.5000 mg | ORAL_TABLET | Freq: Three times a day (TID) | ORAL | Status: DC | PRN
  Administered 2019-02-04 – 2019-02-05 (×3): 0.5 mg via ORAL
  Filled 2019-02-04 (×3): qty 1

## 2019-02-04 MED ORDER — CETIRIZINE HCL 10 MG PO TABS
10.0000 mg | ORAL_TABLET | Freq: Every day | ORAL | Status: DC
  Filled 2019-02-04: qty 1

## 2019-02-04 MED ORDER — FAMOTIDINE 20 MG PO TABS
20.0000 mg | ORAL_TABLET | Freq: Two times a day (BID) | ORAL | Status: DC
  Administered 2019-02-04 – 2019-02-05 (×2): 20 mg via ORAL
  Filled 2019-02-04 (×2): qty 1

## 2019-02-04 MED ORDER — ADULT MULTIVITAMIN W/MINERALS CH
1.0000 | ORAL_TABLET | Freq: Every day | ORAL | Status: DC
  Administered 2019-02-05: 1 via ORAL
  Filled 2019-02-04 (×2): qty 1

## 2019-02-04 MED ORDER — DEXTROMETHORPHAN POLISTIREX ER 30 MG/5ML PO SUER
30.0000 mg | Freq: Two times a day (BID) | ORAL | Status: DC | PRN
  Filled 2019-02-04: qty 5

## 2019-02-04 MED ORDER — PANTOPRAZOLE SODIUM 40 MG PO TBEC
40.0000 mg | DELAYED_RELEASE_TABLET | Freq: Every day | ORAL | Status: DC
  Administered 2019-02-05: 08:00:00 40 mg via ORAL
  Filled 2019-02-04: qty 1

## 2019-02-04 NOTE — Progress Notes (Signed)
Corey West at [place] NAME: Corey West    MR#:  [id number]  DATE OF BIRTH:  [birth year]  SUBJECTIVE:  CHIEF COMPLAINT: Patient is feeling much better, off BiPAP.  COVID test negative.  Weaned off oxygen to room air Prefers going home tomorrow as tomorrow is his birthday  REVIEW OF SYSTEMS:  CONSTITUTIONAL: No fever, fatigue or weakness.  EYES: No blurred or double vision.  EARS, NOSE, AND THROAT: No tinnitus or ear pain.  RESPIRATORY: Improving cough, shortness of breath, no wheezing or hemoptysis.  CARDIOVASCULAR: No chest pain, orthopnea, edema.  GASTROINTESTINAL: No nausea, vomiting, diarrhea or abdominal pain.  GENITOURINARY: No dysuria, hematuria.  ENDOCRINE: No polyuria, nocturia,  HEMATOLOGY: No anemia, easy bruising or bleeding SKIN: No rash or lesion. MUSCULOSKELETAL: No joint pain or arthritis.   NEUROLOGIC: No tingling, numbness, weakness.  PSYCHIATRY: No anxiety or depression.   DRUG ALLERGIES:  No Known Allergies  VITALS:  Blood pressure 135/64, pulse 63, temperature 97.6 F (36.4 C), temperature source Oral, resp. rate 20, weight 130 kg, SpO2 100 %.  PHYSICAL EXAMINATION:  GENERAL:  68 y.o.-year-old patient lying in the bed with no acute distress.  EYES: Pupils equal, round, reactive to light and accommodation. No scleral icterus. Extraocular muscles intact.  HEENT: Head atraumatic, normocephalic. Oropharynx and nasopharynx clear.  NECK:  Supple, no jugular venous distention. No thyroid enlargement, no tenderness.  LUNGS: Moderate breath sounds bilaterally, no wheezing, rales,rhonchi.  Few crepitation. No use of accessory muscles of respiration.  CARDIOVASCULAR: S1, S2 normal. No murmurs, rubs, or gallops.  ABDOMEN: Soft, nontender, nondistended. Bowel sounds present.   EXTREMITIES: No pedal edema, cyanosis, or clubbing.  NEUROLOGIC: Cranial nerves II through XII are intact. Muscle strength grossly intact sensation  intact. Gait not checked.  PSYCHIATRIC: The patient is alert and oriented x 3.  SKIN: No obvious rash, lesion, or ulcer.    LABORATORY PANEL:   CBC Recent Labs  Lab 02/03/19 0005  WBC 8.9  HGB 8.2*  HCT 24.8*  PLT 97*   ------------------------------------------------------------------------------------------------------------------  Chemistries  Recent Labs  Lab 02/04/19 0452  NA 143  K 4.0  CL 115*  CO2 22  GLUCOSE 151*  BUN 33*  CREATININE 2.18*  CALCIUM 8.7*  AST 35  ALT 28  ALKPHOS 66  BILITOT 1.0   ------------------------------------------------------------------------------------------------------------------  Cardiac Enzymes No results for input(s): TROPONINI in the last 168 hours. ------------------------------------------------------------------------------------------------------------------  RADIOLOGY:  Dg Chest Port 1 View  Result Date: 02/03/2019 CLINICAL DATA:  68 y/o  M; shortness of breath, fever, nausea. EXAM: PORTABLE CHEST 1 VIEW COMPARISON:  01/14/2019 chest radiograph. FINDINGS: Stable cardiomegaly given projection and technique. Low lung volumes. Increased reticular opacities. No consolidation, effusion, or pneumothorax. Bones are unremarkable. IMPRESSION: Low lung volumes. Cardiomegaly. Increased reticular opacities may represent interstitial edema or atypical pneumonia. Electronically Signed   By: Kristine Garbe M.D.   On: 02/03/2019 00:53    EKG:   Orders placed or performed during the hospital encounter of 02/02/19  . ED EKG 12-Lead  . ED EKG 12-Lead    ASSESSMENT AND PLAN:   This is a 68 year old male admitted for pneumonia. 1.  Pneumonia: Healthcare associated;  Clinically improving with IV vancomycin and cefepime  Blood cultures are negative so far  Urine culture pending  Weaned off oxygen to room air   (The patient was recently admitted to the hospital for rule out of stroke and subsequently to skilled nursing  facility for rehab of  general debilitation).   2.  Sepsis: From healthcare associated pneumonia the patient meets criteria via fever, tachypnea elevated lactic acid.  He is hemodynamically stable.  Follow blood cultures for growth and sensitivities are negative so far 3.  Diabetes mellitus type 2: The patient is on large doses of mixed insulin twice a day.  Continue basal insulin therapy adjusted for hospital diet.  Sliding scale insulin as well for supplemental coverage. 4.  Chronic kidney disease: Stage III.  Hydrated with intravenous fluid.  Avoid nephrotoxic agents.  Baseline creatinine seems to be at 1.9.  We will continue close monitoring 5.  Cirrhosis: Continue rifaximin, seems to be at his baseline 6.  Chronic venous stasis of lower extremities: Continue Lasix per home regimen 7.  DVT prophylaxis: Heparin    Generalized weakness physical therapy has evaluated the patient.  No PT needs identified   All the records are reviewed and case discussed with Care Management/Social Workerr. Management plans discussed with the patient, he is in agreement.  CODE STATUS: fc   TOTAL TIME TAKING CARE OF THIS PATIENT: 37  minutes.   POSSIBLE D/C IN 1 DAYS, DEPENDING ON CLINICAL CONDITION.  Note: This dictation was prepared with Dragon dictation along with smaller phrase technology. Any transcriptional errors that result from this process are unintentional.   [name] on 02/04/2019 at 11:36 AM  Between 7am to 6pm - Pager - [phone] After 6pm go to [email or web address] - password [email or web address] [facility]  Office  [phone]  CC: Primary care physician; Baxter Hire, MD

## 2019-02-04 NOTE — Progress Notes (Signed)
Pharmacy Antibiotic Note  Corey West is a 68 y.o. male admitted on 02/02/2019 with pneumonia.  Pharmacy has been consulted for vanc/cefepime dosing. MRSA PCR pending. However, BCx x1 bottle (aerobic) positive for MRSA- though likely a blood culture contaminant. Will continue Vancomycin for one additional day.   Plan: Will continue  Vancomycin 1250 mg IV Q 24 hrs. Goal AUC 400-550. Expected AUC: 538.1 SCr used: 1.97 Cssmin: 14.7  Will continue cefepime 2g IV q12h per CrCl 30 - 60 ml/min and flagyl 500 mg IV q8h.  Weight: 286 lb 9.6 oz (130 kg)  Temp (24hrs), Avg:97.8 F (36.6 C), Min:97.6 F (36.4 C), Max:98.1 F (36.7 C)  Recent Labs  Lab 02/03/19 0005 02/03/19 0220 02/04/19 0452  WBC 8.9  --   --   CREATININE 1.97*  --  2.18*  LATICACIDVEN 2.1* 1.2  --     Estimated Creatinine Clearance: 45.8 mL/min (A) (by C-G formula based on SCr of 2.18 mg/dL (H)).    No Known Allergies  Thank you for allowing pharmacy to be a part of this patient's care.  Kristeen Miss, PharmD Clinical Pharmacist 02/04/2019

## 2019-02-05 LAB — URINE CULTURE: Special Requests: NORMAL

## 2019-02-05 LAB — BASIC METABOLIC PANEL
Anion gap: 5 (ref 5–15)
BUN: 37 mg/dL — ABNORMAL HIGH (ref 8–23)
CO2: 20 mmol/L — ABNORMAL LOW (ref 22–32)
Calcium: 8.2 mg/dL — ABNORMAL LOW (ref 8.9–10.3)
Chloride: 115 mmol/L — ABNORMAL HIGH (ref 98–111)
Creatinine, Ser: 2.09 mg/dL — ABNORMAL HIGH (ref 0.61–1.24)
GFR calc Af Amer: 37 mL/min — ABNORMAL LOW (ref >60)
GFR calc non Af Amer: 32 mL/min — ABNORMAL LOW (ref >60)
Glucose, Bld: 124 mg/dL — ABNORMAL HIGH (ref 70–99)
Potassium: 3.9 mmol/L (ref 3.5–5.1)
Sodium: 140 mmol/L (ref 135–145)

## 2019-02-05 LAB — AMMONIA: Ammonia: 48 umol/L — ABNORMAL HIGH (ref 9–35)

## 2019-02-05 LAB — GLUCOSE, CAPILLARY: Glucose-Capillary: 81 mg/dL (ref 70–99)

## 2019-02-05 MED ORDER — AMOXICILLIN-POT CLAVULANATE 875-125 MG PO TABS: 1.0000 | ORAL_TABLET | Freq: Two times a day (BID) | ORAL | 0 refills | Status: AC

## 2019-02-05 MED ORDER — DOXYCYCLINE HYCLATE 100 MG PO TABS: 100.0000 mg | ORAL_TABLET | Freq: Two times a day (BID) | ORAL | 0 refills | Status: AC

## 2019-02-05 MED ORDER — ATORVASTATIN CALCIUM 40 MG PO TABS: 40.0000 mg | ORAL_TABLET | Freq: Every day | ORAL | 0 refills | Status: DC

## 2019-02-05 MED ORDER — DOXYCYCLINE HYCLATE 100 MG PO TABS
100.0000 mg | ORAL_TABLET | Freq: Two times a day (BID) | ORAL | Status: DC
  Administered 2019-02-05: 100 mg via ORAL
  Filled 2019-02-05: qty 1

## 2019-02-05 NOTE — Discharge Summary (Signed)
[facility] [place] NAME: Corey West    MR#:  [id number]  DATE OF BIRTH:  06-Jun-1951  DATE OF ADMISSION:  02/02/2019 ADMITTING PHYSICIAN: Harrie Foreman, MD  DATE OF DISCHARGE:  02/05/19  PRIMARY CARE PHYSICIAN: Baxter Hire, MD    ADMISSION DIAGNOSIS:  Hepatic encephalopathy ([place]) [K72.90] Community acquired pneumonia, unspecified laterality [J18.9] Sepsis, due to unspecified organism, unspecified whether acute organ dysfunction present ([place]) [A41.9]  DISCHARGE DIAGNOSIS:  Active Problems:   HCAP (healthcare-associated pneumonia) Blood culture and urine culture results are pending  SECONDARY DIAGNOSIS:   Past Medical History:  Diagnosis Date  . Anemia   . Anxiety    controlled;   . Arthritis   . AVM (arteriovenous malformation) of stomach, acquired with hemorrhage   . Barrett's esophagus   . Chronic kidney disease    renal infufficiency  . Cirrhosis ([place])   . Depression    controlled;   Marland Kitchen Diabetes mellitus without complication ([place])    not controlled, taking insulin but sugar continues to run high;   . Edema   . Esophageal varices ([place])   . GAVE (gastric antral vascular ectasia)   . GERD (gastroesophageal reflux disease)   . History of hiatal hernia   . Hyperlipidemia   . Hypertension    controlled well;   . 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 Obesity   . Pancytopenia ([place])   . Polyp, stomach    with chronic blood loss  . Sleep apnea    does not wear a cpap, Medicare would not pay for it   . Venous stasis dermatitis of both lower extremities   . Venous stasis of both lower extremities    with cellulitis    HOSPITAL COURSE:  HPI: Patient with past medical history of cirrhosis, esophageal varices, gastric AVM, diabetes mellitus type 2, hypertension and chronic kidney disease presents to the emergency department due to fever.  The patient's temperature was 102.3 F upon arrival.  He admits to rigors and  chills.  Oxygen saturations upon arrival of 78 to 80% on room air and he had increased work of breathing as well which prompted the emergency department staff to initiate BiPAP.  Chest x-ray showed possible interstitial edema versus atypical pneumonia.  Sepsis protocol initiated.  Novel coronavirus assay negative.  The patient's aspiratory effort improved and he was weaned from BiPAP prior to the emergency department staff called the hospitalist service for admission.     1. Pneumonia: Healthcare associated; Clinically improved with IV vancomycin and cefepime, patient is reluctant to stay in the hospital and prefers going home.  We will discharge him home with Augmentin p.o. and doxycycline  Blood cultures are ending 1 bottle positive for gram-positive cocci.  Primary care physician to follow on the final result.  Patient understood that he should be seen by primary care physician at the earliest possible Urine culture pending  Weaned off oxygen to room air  (The patient was recently admitted to the hospital for rule out of stroke and subsequently to skilled nursing facility for rehab ofgeneral debilitation).  2. Sepsis: From healthcare associated pneumonia the patient meets criteria via fever, tachypnea elevated lactic acid. He is hemodynamically stable. Follow blood cultures for growth and sensitivities  which are pending at the time of discharge 3. Diabetes mellitus type 2: The patient is on large doses of mixed insulin twice a day. Continue basal insulin therapy adjusted for hospital diet. Sliding scale insulin  as well for supplemental coverage. 4. Chronic kidney disease: Stage III. Hydrated with intravenous fluid. Avoid nephrotoxic agents.  Baseline creatinine seems to be at 1.9.    Outpatient nephrology follow-up 5. Cirrhosis: Continue rifaximin, seems to be at his baseline 6. Chronic venous stasis of lower extremities: Continue Lasix per home regimen 7. DVT prophylaxis:  Heparin  DISCHARGE CONDITIONS:  Stable   CONSULTS OBTAINED:     PROCEDURES  None   DRUG ALLERGIES:  No Known Allergies  DISCHARGE MEDICATIONS:   Allergies as of 02/05/2019   No Known Allergies     Medication List    STOP taking these medications   HYDROcodone-acetaminophen 5-325 MG tablet Commonly known as:  NORCO/VICODIN     TAKE these medications   amoxicillin-clavulanate 875-125 MG tablet Commonly known as:  Augmentin Take 1 tablet by mouth 2 (two) times daily for 7 days.   atorvastatin 40 MG tablet Commonly known as:  LIPITOR Take 1 tablet (40 mg total) by mouth daily at 6 PM.   cetirizine 10 MG tablet Commonly known as:  ZYRTEC Take 10 mg by mouth daily.   Delsym 30 MG/5ML liquid Generic drug:  dextromethorphan Take 30 mg by mouth 2 (two) times daily.   doxycycline 100 MG tablet Commonly known as:  VIBRA-TABS Take 1 tablet (100 mg total) by mouth every 12 (twelve) hours for 7 days.   famotidine 20 MG tablet Commonly known as:  PEPCID Take 20 mg by mouth 2 (two) times daily.   ferrous sulfate 325 (65 FE) MG tablet Take 325 mg by mouth 2 (two) times daily with a meal.   furosemide 40 MG tablet Commonly known as:  LASIX Take 40 mg by mouth 2 (two) times a day.   insulin aspart protamine - aspart (70-30) 100 UNIT/ML FlexPen Commonly known as:  NOVOLOG 70/30 MIX Inject 70-84 Units into the skin See admin instructions. Inject 84u under the skin every morning and 70u under the skin every evening   lactulose 10 GM/15ML solution Commonly known as:  CHRONULAC Take 30 g by mouth 4 (four) times daily.   loperamide 2 MG capsule Commonly known as:  IMODIUM Take 2 mg by mouth 3 (three) times daily.   LORazepam 0.5 MG tablet Commonly known as:  ATIVAN Take 1 tablet (0.5 mg total) by mouth every 8 (eight) hours as needed for anxiety.   metolazone 5 MG tablet Commonly known as:  ZAROXOLYN Take 5 mg by mouth daily.   multivitamin tablet Take 1 tablet by  mouth daily.   omeprazole 40 MG capsule Commonly known as:  PRILOSEC Take 40 mg by mouth daily.   Potassium Chloride ER 20 MEQ Tbcr Take 20 mEq by mouth 2 (two) times daily. While on lasix'   rifaximin 550 MG Tabs tablet Commonly known as:  Xifaxan Take 1 tablet (550 mg total) by mouth 2 (two) times daily.   traMADol 50 MG tablet Commonly known as:  ULTRAM Take 50 mg by mouth 3 (three) times daily as needed for moderate pain.   vitamin C 250 MG tablet Commonly known as:  ASCORBIC ACID Take 500 mg by mouth 2 (two) times daily.   zinc sulfate 220 (50 Zn) MG capsule Take 220 mg by mouth daily.        DISCHARGE INSTRUCTIONS:   Follow-up with primary care physician in 2 to 3 days.  PCP to follow-up on the blood cultures and urine cultures which are pending Follow-up with nephrology in 2 weeks  DIET:  Cardiac diet and Diabetic diet  DISCHARGE CONDITION:  Fair  ACTIVITY:  Activity as tolerated  OXYGEN:  Home Oxygen: No.   Oxygen Delivery: room air  DISCHARGE LOCATION:  home   If you experience worsening of your admission symptoms, develop shortness of breath, life threatening emergency, suicidal or homicidal thoughts you must seek medical attention immediately by calling 911 or calling your MD immediately  if symptoms less severe.  You Must read complete instructions/literature along with all the possible adverse reactions/side effects for all the Medicines you take and that have been prescribed to you. Take any new Medicines after you have completely understood and accpet all the possible adverse reactions/side effects.   Please note  You were cared for by a hospitalist during your hospital stay. If you have any questions about your discharge medications or the care you received while you were in the hospital after you are discharged, you can call the unit and asked to speak with the hospitalist on call if the hospitalist that took care of you is not available. Once  you are discharged, your primary care physician will handle any further medical issues. Please note that NO REFILLS for any discharge medications will be authorized once you are discharged, as it is imperative that you return to your primary care physician (or establish a relationship with a primary care physician if you do not have one) for your aftercare needs so that they can reassess your need for medications and monitor your lab values.     Today  Chief Complaint  Patient presents with  . Fever   Patient is feeling much better and he wants to go home though blood cultures are pending as today is his birthday and he promised to follow-up with primary care physician in a day or 2/earliest possible  ROS:  CONSTITUTIONAL: Denies fevers, chills. Denies any fatigue, weakness.  EYES: Denies blurry vision, double vision, eye pain. EARS, NOSE, THROAT: Denies tinnitus, ear pain, hearing loss. RESPIRATORY: Denies cough, wheeze, shortness of breath.  CARDIOVASCULAR: Denies chest pain, palpitations, edema.  GASTROINTESTINAL: Denies nausea, vomiting, diarrhea, abdominal pain. Denies bright red blood per rectum. GENITOURINARY: Denies dysuria, hematuria. ENDOCRINE: Denies nocturia or thyroid problems. HEMATOLOGIC AND LYMPHATIC: Denies easy bruising or bleeding. SKIN: Denies rash or lesion. MUSCULOSKELETAL: Denies pain in neck, back, shoulder, knees, hips or arthritic symptoms.  NEUROLOGIC: Denies paralysis, paresthesias.  PSYCHIATRIC: Denies anxiety or depressive symptoms.   VITAL SIGNS:  Blood pressure 138/61, pulse 66, temperature 98.1 F (36.7 C), temperature source Oral, resp. rate 20, weight 135.4 kg, SpO2 99 %.  I/O:    Intake/Output Summary (Last 24 hours) at 02/05/2019 0859 Last data filed at 02/04/2019 2044 Gross per 24 hour  Intake 1766.77 ml  Output 200 ml  Net 1566.77 ml    PHYSICAL EXAMINATION:  GENERAL:  68 y.o.-year-old patient lying in the bed with no acute distress.   EYES: Pupils equal, round, reactive to light and accommodation. No scleral icterus. Extraocular muscles intact.  HEENT: Head atraumatic, normocephalic. Oropharynx and nasopharynx clear.  NECK:  Supple, no jugular venous distention. No thyroid enlargement, no tenderness.  LUNGS: Normal breath sounds bilaterally, no wheezing, rales,rhonchi or crepitation. No use of accessory muscles of respiration.  CARDIOVASCULAR: S1, S2 normal. No murmurs, rubs, or gallops.  ABDOMEN: Soft, non-tender, non-distended. Bowel sounds present. No organomegaly or mass.  EXTREMITIES: No pedal edema, cyanosis, or clubbing.  NEUROLOGIC: Awake alert and oriented x3.  Motor grossly intact sensation intact. Gait not checked.  PSYCHIATRIC: The patient is alert and oriented x 3.  SKIN: No obvious rash, lesion, or ulcer.   DATA REVIEW:   CBC Recent Labs  Lab 02/03/19 0005  WBC 8.9  HGB 8.2*  HCT 24.8*  PLT 97*    Chemistries  Recent Labs  Lab 02/04/19 0452 02/05/19 0314  NA 143 140  K 4.0 3.9  CL 115* 115*  CO2 22 20*  GLUCOSE 151* 124*  BUN 33* 37*  CREATININE 2.18* 2.09*  CALCIUM 8.7* 8.2*  AST 35  --   ALT 28  --   ALKPHOS 66  --   BILITOT 1.0  --     Cardiac Enzymes No results for input(s): TROPONINI in the last 168 hours.  Microbiology Results  Results for orders placed or performed during the hospital encounter of 02/02/19  Blood Culture (routine x 2)     Status: None (Preliminary result)   Collection Time: 02/03/19 12:05 AM  Result Value Ref Range Status   Specimen Description   Final    BLOOD BLOOD LEFT FOREARM Performed at [facility], [address] [place], [place] [address]    Special Requests   Final    BOTTLES DRAWN AEROBIC AND ANAEROBIC Blood Culture adequate volume Performed at [facility], [address] [place], [place] [address]    Culture  Setup Time   Final    GRAM POSITIVE COCCI AEROBIC BOTTLE ONLY CRITICAL RESULT CALLED TO, READ BACK BY AND  VERIFIED WITHErasmo Downer MERRILL AT 0240 02/04/2019 [place] Performed at [facility], [address], [place], [place] [address]    Culture GRAM POSITIVE COCCI  Final   Report Status PENDING  Incomplete  Blood Culture (routine x 2)     Status: None (Preliminary result)   Collection Time: 02/03/19 12:05 AM  Result Value Ref Range Status   Specimen Description BLOOD BLOOD RIGHT FOREARM  Final   Special Requests   Final    BOTTLES DRAWN AEROBIC AND ANAEROBIC Blood Culture adequate volume   Culture   Final    NO GROWTH 2 DAYS Performed at [facility], [address] [place], [place] [address]    Report Status PENDING  Incomplete  SARS Coronavirus 2 (CEPHEID- Performed in [facility] hospital lab), Hosp Order     Status: None   Collection Time: 02/03/19 12:05 AM  Result Value Ref Range Status   SARS Coronavirus 2 NEGATIVE NEGATIVE Final    Comment: (NOTE) If result is NEGATIVE SARS-CoV-2 target nucleic acids are NOT DETECTED. The SARS-CoV-2 RNA is generally detectable in upper and lower  respiratory specimens during the acute phase of infection. The lowest  concentration of SARS-CoV-2 viral copies this assay can detect is 250  copies / mL. A negative result does not preclude SARS-CoV-2 infection  and should not be used as the sole basis for treatment or other  patient management decisions.  A negative result may occur with  improper specimen collection / handling, submission of specimen other  than nasopharyngeal swab, presence of viral mutation(s) within the  areas targeted by this assay, and inadequate number of viral copies  (<250 copies / mL). A negative result must be combined with clinical  observations, patient history, and epidemiological information. If result is POSITIVE SARS-CoV-2 target nucleic acids are DETECTED. The SARS-CoV-2 RNA is generally detectable in upper and lower  respiratory specimens dur ing the acute phase of infection.  Positive  results are  indicative of active infection with  SARS-CoV-2.  Clinical  correlation with patient history and other diagnostic information is  necessary to determine patient infection status.  Positive results do  not rule out bacterial infection or co-infection with other viruses. If result is PRESUMPTIVE POSTIVE SARS-CoV-2 nucleic acids MAY BE PRESENT.   A presumptive positive result was obtained on the submitted specimen  and confirmed on repeat testing.  While 2019 novel coronavirus  (SARS-CoV-2) nucleic acids may be present in the submitted sample  additional confirmatory testing may be necessary for epidemiological  and / or clinical management purposes  to differentiate between  SARS-CoV-2 and other Sarbecovirus currently known to infect humans.  If clinically indicated additional testing with an alternate test  methodology [phone]) is advised. The SARS-CoV-2 RNA is generally  detectable in upper and lower respiratory sp ecimens during the acute  phase of infection. The expected result is Negative. Fact Sheet for Patients:  StrictlyIdeas.no Fact Sheet for Healthcare Providers: BankingDealers.co.za This test is not yet approved or cleared by the [place] FDA and has been authorized for detection and/or diagnosis of SARS-CoV-2 by FDA under an Emergency Use Authorization (EUA).  This EUA will remain in effect (meaning this test can be used) for the duration of the COVID-19 declaration under Section 564(b)(1) of the Act, 21 U.S.C. section 360bbb-3(b)(1), unless the authorization is terminated or revoked sooner. Performed at [facility], [address] [place], [place] [address]   Blood Culture ID Panel (Reflexed)     Status: Abnormal   Collection Time: 02/03/19 12:05 AM  Result Value Ref Range Status   Enterococcus species NOT DETECTED NOT DETECTED Final   Listeria monocytogenes NOT DETECTED NOT DETECTED Final   Staphylococcus  species DETECTED (A) NOT DETECTED Final    Comment: Methicillin (oxacillin) resistant coagulase negative staphylococcus. Possible blood culture contaminant (unless isolated from more than one blood culture draw or clinical case suggests pathogenicity). No antibiotic treatment is indicated for blood  culture contaminants. CRITICAL RESULT CALLED TO, READ BACK BY AND VERIFIED WITH:  KRISTIN MERRILL AT 6073 02/04/2019 SDR    Staphylococcus aureus (BCID) NOT DETECTED NOT DETECTED Final   Methicillin resistance DETECTED (A) NOT DETECTED Final    Comment: CRITICAL RESULT CALLED TO, READ BACK BY AND VERIFIED WITH:  KRISTIN MERRILL AT 7106 02/04/2019 SDR    Streptococcus species NOT DETECTED NOT DETECTED Final   Streptococcus agalactiae NOT DETECTED NOT DETECTED Final   Streptococcus pneumoniae NOT DETECTED NOT DETECTED Final   Streptococcus pyogenes NOT DETECTED NOT DETECTED Final   Acinetobacter baumannii NOT DETECTED NOT DETECTED Final   Enterobacteriaceae species NOT DETECTED NOT DETECTED Final   Enterobacter cloacae complex NOT DETECTED NOT DETECTED Final   Escherichia coli NOT DETECTED NOT DETECTED Final   Klebsiella oxytoca NOT DETECTED NOT DETECTED Final   Klebsiella pneumoniae NOT DETECTED NOT DETECTED Final   Proteus species NOT DETECTED NOT DETECTED Final   Serratia marcescens NOT DETECTED NOT DETECTED Final   Haemophilus influenzae NOT DETECTED NOT DETECTED Final   Neisseria meningitidis NOT DETECTED NOT DETECTED Final   Pseudomonas aeruginosa NOT DETECTED NOT DETECTED Final   Candida albicans NOT DETECTED NOT DETECTED Final   Candida glabrata NOT DETECTED NOT DETECTED Final   Candida krusei NOT DETECTED NOT DETECTED Final   Candida parapsilosis NOT DETECTED NOT DETECTED Final   Candida tropicalis NOT DETECTED NOT DETECTED Final    Comment: Performed at [facility], [address], [place], [place] [address]    RADIOLOGY:  Dg Chest [facility]  1 View  Result Date:  02/03/2019 CLINICAL DATA:  68 y/o  M; shortness of breath, fever, nausea. EXAM: PORTABLE CHEST 1 VIEW COMPARISON:  01/14/2019 chest radiograph. FINDINGS: Stable cardiomegaly given projection and technique. Low lung volumes. Increased reticular opacities. No consolidation, effusion, or pneumothorax. Bones are unremarkable. IMPRESSION: Low lung volumes. Cardiomegaly. Increased reticular opacities may represent interstitial edema or atypical pneumonia. Electronically Signed   By: Kristine Garbe M.D.   On: 02/03/2019 00:53    EKG:   Orders placed or performed during the hospital encounter of 02/02/19  . ED EKG 12-Lead  . ED EKG 12-Lead      Management plans discussed with the patient,he is  in agreement.  CODE STATUS:     Code Status Orders  (From admission, onward)         Start     Ordered   02/03/19 0542  Full code  Continuous     02/03/19 0541        Code Status History    Date Active Date Inactive Code Status Order ID Comments User Context   01/15/2019 0047 01/15/2019 1657 Full Code [id number]  Lance Coon, MD Inpatient   12/02/2018 1713 12/09/2018 1737 Full Code [id number]  Hillary Bow, MD ED   10/16/2018 0054 10/18/2018 1842 Full Code [id number]  [name], MD Inpatient   07/18/2018 2339 07/21/2018 1356 Full Code [id number]  Lance Coon, MD Inpatient   06/18/2018 1609 06/19/2018 1717 Full Code [id number]  Epifanio Lesches, MD ED   12/31/2017 1856 01/01/2018 1205 Full Code [id number]  Saundra Shelling, MD Inpatient   12/25/2017 1456 12/26/2017 1618 Full Code [id number]  Epifanio Lesches, MD ED   12/16/2017 0921 12/18/2017 1635 DNR [id number]  Max Sane, MD Inpatient   12/16/2017 0305 12/16/2017 0921 Full Code [id number]  Salary, Avel Peace, MD ED   12/02/2017 2003 12/05/2017 2000 Full Code [id number]  Saundra Shelling, MD Inpatient   11/21/2017 1948 11/23/2017 1958 Full Code [id number]  Loletha Grayer, MD ED   10/23/2017 1405 10/26/2017 1402 Full Code [id number]  Bettey Costa,  MD Inpatient   10/18/2017 0058 10/19/2017 1806 Full Code [id number]  Saundra Shelling, MD Inpatient   10/09/2017 2139 10/11/2017 1806 Full Code [id number]  [name], MD Inpatient   09/03/2017 2124 09/05/2017 1552 Full Code [id number]  Nicholes Mango, MD Inpatient   06/26/2017 1119 06/28/2017 1748 Full Code [id number]  Henreitta Leber, MD Inpatient   02/21/2017 2007 02/22/2017 1803 Full Code [id number]  [name], MD Inpatient   07/20/2015 1245 07/22/2015 2217 Full Code [id number]  Nicholes Mango, MD Inpatient   02/08/2015 2331 02/11/2015 1943 Full Code [id number]  Hower, Aaron Mose, MD ED      TOTAL TIME TAKING CARE OF THIS PATIENT: 43 minutes.   Note: This dictation was prepared with Dragon dictation along with smaller phrase technology. Any transcriptional errors that result from this process are unintentional.   @[facility] @  on 02/05/2019 at 8:59 AM  Between 7am to 6pm - Pager - [phone]  After 6pm go to [email or web address] - password [email or web address] [place] [facility]  Office  [phone]  CC: Primary care physician; Baxter Hire, MD

## 2019-02-05 NOTE — Discharge Instructions (Signed)
Follow-up with primary care physician in 2 to 3 days.  PCP to follow-up on the blood cultures and urine cultures which are pending Follow-up with nephrology in 2 weeks

## 2019-02-06 LAB — CULTURE, BLOOD (ROUTINE X 2): Special Requests: ADEQUATE

## 2019-02-08 LAB — CULTURE, BLOOD (ROUTINE X 2)
Culture: NO GROWTH
Special Requests: ADEQUATE

## 2019-02-24 ENCOUNTER — Other Ambulatory Visit: Payer: Self-pay

## 2019-02-24 ENCOUNTER — Ambulatory Visit
Admission: RE | Admit: 2019-02-24 | Discharge: 2019-02-24 | Disposition: A | Discharge status: Home / Self Care | Payer: Medicare Other | Source: Ambulatory Visit | Attending: Unknown Physician Specialty | Admitting: Unknown Physician Specialty

## 2019-02-24 DIAGNOSIS — D500 Iron deficiency anemia secondary to blood loss (chronic): Secondary | ICD-10-CM | POA: Diagnosis not present

## 2019-02-24 DIAGNOSIS — K7460 Unspecified cirrhosis of liver: Secondary | ICD-10-CM | POA: Diagnosis not present

## 2019-02-24 DIAGNOSIS — K5520 Angiodysplasia of colon without hemorrhage: Secondary | ICD-10-CM | POA: Insufficient documentation

## 2019-02-24 LAB — HEMOGLOBIN AND HEMATOCRIT, BLOOD
HCT: 21.7 % — ABNORMAL LOW (ref 39.0–52.0)
Hemoglobin: 7 g/dL — ABNORMAL LOW (ref 13.0–17.0)

## 2019-02-24 LAB — HEMOGLOBIN: Hemoglobin: 8.1 g/dL — ABNORMAL LOW (ref 13.0–17.0)

## 2019-02-24 LAB — PREPARE RBC (CROSSMATCH)

## 2019-02-24 MED ORDER — SODIUM CHLORIDE FLUSH 0.9 % IV SOLN
INTRAVENOUS | Status: AC
  Filled 2019-02-24: qty 10

## 2019-02-24 MED ORDER — SODIUM CHLORIDE 0.9% IV SOLUTION: Freq: Once | INTRAVENOUS | Status: DC

## 2019-02-25 LAB — TYPE AND SCREEN
ABO/RH(D): A NEG
Antibody Screen: NEGATIVE
Unit division: 0
Unit division: 0

## 2019-02-25 LAB — BPAM RBC
Blood Product Expiration Date: 202005262359
Blood Product Expiration Date: 202006052359
ISSUE DATE / TIME: 202005260917
ISSUE DATE / TIME: 202005261318
Unit Type and Rh: 600
Unit Type and Rh: 9500

## 2019-03-05 ENCOUNTER — Other Ambulatory Visit: Payer: Self-pay

## 2019-03-05 ENCOUNTER — Encounter: Payer: Self-pay | Admitting: Emergency Medicine

## 2019-03-05 ENCOUNTER — Inpatient Hospital Stay
Admission: EM | Admit: 2019-03-05 | Discharge: 2019-03-06 | DRG: 441 | Disposition: A | Discharge status: Home / Self Care | Payer: Medicare Other | Attending: Internal Medicine | Admitting: Internal Medicine

## 2019-03-05 DIAGNOSIS — Z87440 Personal history of urinary (tract) infections: Secondary | ICD-10-CM

## 2019-03-05 DIAGNOSIS — I509 Heart failure, unspecified: Secondary | ICD-10-CM | POA: Diagnosis present

## 2019-03-05 DIAGNOSIS — Z79899 Other long term (current) drug therapy: Secondary | ICD-10-CM

## 2019-03-05 DIAGNOSIS — K7290 Hepatic failure, unspecified without coma: Secondary | ICD-10-CM | POA: Diagnosis present

## 2019-03-05 DIAGNOSIS — K219 Gastro-esophageal reflux disease without esophagitis: Secondary | ICD-10-CM | POA: Diagnosis not present

## 2019-03-05 DIAGNOSIS — R410 Disorientation, unspecified: Secondary | ICD-10-CM

## 2019-03-05 DIAGNOSIS — I130 Hypertensive heart and chronic kidney disease with heart failure and stage 1 through stage 4 chronic kidney disease, or unspecified chronic kidney disease: Secondary | ICD-10-CM | POA: Diagnosis present

## 2019-03-05 DIAGNOSIS — Z1159 Encounter for screening for other viral diseases: Secondary | ICD-10-CM

## 2019-03-05 DIAGNOSIS — I872 Venous insufficiency (chronic) (peripheral): Secondary | ICD-10-CM | POA: Diagnosis present

## 2019-03-05 DIAGNOSIS — K7682 Hepatic encephalopathy: Secondary | ICD-10-CM | POA: Diagnosis present

## 2019-03-05 DIAGNOSIS — D500 Iron deficiency anemia secondary to blood loss (chronic): Secondary | ICD-10-CM | POA: Diagnosis present

## 2019-03-05 DIAGNOSIS — E785 Hyperlipidemia, unspecified: Secondary | ICD-10-CM | POA: Diagnosis not present

## 2019-03-05 DIAGNOSIS — Z8701 Personal history of pneumonia (recurrent): Secondary | ICD-10-CM

## 2019-03-05 DIAGNOSIS — K766 Portal hypertension: Secondary | ICD-10-CM | POA: Diagnosis present

## 2019-03-05 DIAGNOSIS — N183 Chronic kidney disease, stage 3 unspecified: Secondary | ICD-10-CM

## 2019-03-05 DIAGNOSIS — K7460 Unspecified cirrhosis of liver: Secondary | ICD-10-CM

## 2019-03-05 DIAGNOSIS — M1990 Unspecified osteoarthritis, unspecified site: Secondary | ICD-10-CM | POA: Diagnosis not present

## 2019-03-05 DIAGNOSIS — Z794 Long term (current) use of insulin: Secondary | ICD-10-CM

## 2019-03-05 DIAGNOSIS — G4730 Sleep apnea, unspecified: Secondary | ICD-10-CM | POA: Diagnosis not present

## 2019-03-05 DIAGNOSIS — F17290 Nicotine dependence, other tobacco product, uncomplicated: Secondary | ICD-10-CM | POA: Diagnosis present

## 2019-03-05 DIAGNOSIS — K2270 Barrett's esophagus without dysplasia: Secondary | ICD-10-CM | POA: Diagnosis present

## 2019-03-05 DIAGNOSIS — Z833 Family history of diabetes mellitus: Secondary | ICD-10-CM

## 2019-03-05 DIAGNOSIS — R195 Other fecal abnormalities: Secondary | ICD-10-CM

## 2019-03-05 DIAGNOSIS — K31811 Angiodysplasia of stomach and duodenum with bleeding: Secondary | ICD-10-CM | POA: Diagnosis not present

## 2019-03-05 DIAGNOSIS — I878 Other specified disorders of veins: Secondary | ICD-10-CM | POA: Diagnosis present

## 2019-03-05 DIAGNOSIS — E1122 Type 2 diabetes mellitus with diabetic chronic kidney disease: Secondary | ICD-10-CM | POA: Diagnosis present

## 2019-03-05 DIAGNOSIS — Z8249 Family history of ischemic heart disease and other diseases of the circulatory system: Secondary | ICD-10-CM | POA: Diagnosis not present

## 2019-03-05 HISTORY — DX: Unspecified cirrhosis of liver: K74.60

## 2019-03-05 LAB — URINALYSIS, COMPLETE (UACMP) WITH MICROSCOPIC
Bacteria, UA: NONE SEEN
Bilirubin Urine: NEGATIVE
Glucose, UA: NEGATIVE mg/dL
Hgb urine dipstick: NEGATIVE
Ketones, ur: NEGATIVE mg/dL
Leukocytes,Ua: NEGATIVE
Nitrite: NEGATIVE
Protein, ur: 30 mg/dL — AB
Specific Gravity, Urine: 1.008 (ref 1.005–1.030)
pH: 6 (ref 5.0–8.0)

## 2019-03-05 LAB — CBC WITH DIFFERENTIAL/PLATELET
Basophils Absolute: 0 10*3/uL (ref 0.0–0.1)
Basophils Relative: 0 %
Eosinophils Absolute: 0.2 10*3/uL (ref 0.0–0.5)
Eosinophils Relative: 4 %
HCT: 25.1 % — ABNORMAL LOW (ref 39.0–52.0)
Hemoglobin: 8.3 g/dL — ABNORMAL LOW (ref 13.0–17.0)
Lymphocytes Relative: 13 %
Lymphs Abs: 0.6 10*3/uL — ABNORMAL LOW (ref 0.7–4.0)
MCH: 30.7 pg (ref 26.0–34.0)
MCHC: 33.1 g/dL (ref 30.0–36.0)
MCV: 93 fL (ref 80.0–100.0)
Monocytes Absolute: 0.5 10*3/uL (ref 0.1–1.0)
Monocytes Relative: 10 %
Neutro Abs: 3.4 10*3/uL (ref 1.7–7.7)
Neutrophils Relative %: 73 %
Platelets: 115 10*3/uL — ABNORMAL LOW (ref 150–400)
RBC: 2.7 MIL/uL — ABNORMAL LOW (ref 4.22–5.81)
RDW: 15 % (ref 11.5–15.5)
WBC: 4.7 10*3/uL (ref 4.0–10.5)

## 2019-03-05 LAB — COMPREHENSIVE METABOLIC PANEL
ALT: 25 U/L (ref 0–44)
AST: 30 U/L (ref 15–41)
Albumin: 2.9 g/dL — ABNORMAL LOW (ref 3.5–5.0)
Alkaline Phosphatase: 75 U/L (ref 38–126)
Anion gap: 14 (ref 5–15)
BUN: 35 mg/dL — ABNORMAL HIGH (ref 8–23)
CO2: 27 mmol/L (ref 22–32)
Calcium: 9.1 mg/dL (ref 8.9–10.3)
Chloride: 100 mmol/L (ref 98–111)
Creatinine, Ser: 2.26 mg/dL — ABNORMAL HIGH (ref 0.61–1.24)
GFR calc Af Amer: 34 mL/min — ABNORMAL LOW (ref >60)
GFR calc non Af Amer: 29 mL/min — ABNORMAL LOW (ref >60)
Glucose, Bld: 129 mg/dL — ABNORMAL HIGH (ref 70–99)
Potassium: 3.2 mmol/L — ABNORMAL LOW (ref 3.5–5.1)
Sodium: 141 mmol/L (ref 135–145)
Total Bilirubin: 0.8 mg/dL (ref 0.3–1.2)
Total Protein: 6.6 g/dL (ref 6.5–8.1)

## 2019-03-05 LAB — SARS CORONAVIRUS 2 BY RT PCR (HOSPITAL ORDER, PERFORMED IN ~~LOC~~ HOSPITAL LAB): SARS Coronavirus 2: NEGATIVE

## 2019-03-05 LAB — PROTIME-INR
INR: 1.2 (ref 0.8–1.2)
Prothrombin Time: 15.1 seconds (ref 11.4–15.2)

## 2019-03-05 LAB — VITAMIN B12: Vitamin B-12: 861 pg/mL (ref 180–914)

## 2019-03-05 LAB — GLUCOSE, CAPILLARY
Glucose-Capillary: 87 mg/dL (ref 70–99)
Glucose-Capillary: 172 mg/dL — ABNORMAL HIGH (ref 70–99)

## 2019-03-05 LAB — LACTIC ACID, PLASMA: Lactic Acid, Venous: 1.4 mmol/L (ref 0.5–1.9)

## 2019-03-05 LAB — AMMONIA: Ammonia: 105 umol/L — ABNORMAL HIGH (ref 9–35)

## 2019-03-05 LAB — APTT: aPTT: 28 seconds (ref 24–36)

## 2019-03-05 MED ORDER — ATORVASTATIN CALCIUM 20 MG PO TABS
40.0000 mg | ORAL_TABLET | Freq: Every day | ORAL | Status: DC
  Administered 2019-03-05: 40 mg via ORAL
  Filled 2019-03-05: qty 2

## 2019-03-05 MED ORDER — LORATADINE 10 MG PO TABS
10.0000 mg | ORAL_TABLET | Freq: Every day | ORAL | Status: DC
  Administered 2019-03-06: 10 mg via ORAL
  Filled 2019-03-05: qty 1

## 2019-03-05 MED ORDER — RIFAXIMIN 550 MG PO TABS: 550.0000 mg | ORAL_TABLET | Freq: Two times a day (BID) | ORAL | Status: DC

## 2019-03-05 MED ORDER — FUROSEMIDE 40 MG PO TABS
40.0000 mg | ORAL_TABLET | Freq: Two times a day (BID) | ORAL | Status: DC
  Administered 2019-03-05 – 2019-03-06 (×2): 40 mg via ORAL
  Filled 2019-03-05 (×2): qty 1

## 2019-03-05 MED ORDER — ONDANSETRON HCL 4 MG PO TABS: 4.0000 mg | ORAL_TABLET | Freq: Four times a day (QID) | ORAL | Status: DC | PRN

## 2019-03-05 MED ORDER — LACTULOSE 10 GM/15ML PO SOLN
30.0000 g | Freq: Four times a day (QID) | ORAL | Status: DC
  Administered 2019-03-05 (×2): 30 g via ORAL
  Filled 2019-03-05 (×3): qty 60

## 2019-03-05 MED ORDER — RIFAXIMIN 550 MG PO TABS
550.0000 mg | ORAL_TABLET | Freq: Two times a day (BID) | ORAL | Status: DC
  Administered 2019-03-05 – 2019-03-06 (×2): 550 mg via ORAL
  Filled 2019-03-05 (×2): qty 1

## 2019-03-05 MED ORDER — ACETAMINOPHEN 650 MG RE SUPP: 650.0000 mg | Freq: Four times a day (QID) | RECTAL | Status: DC | PRN

## 2019-03-05 MED ORDER — ADULT MULTIVITAMIN W/MINERALS CH
1.0000 | ORAL_TABLET | Freq: Every day | ORAL | Status: DC
  Administered 2019-03-05 – 2019-03-06 (×2): 1 via ORAL
  Filled 2019-03-05 (×2): qty 1

## 2019-03-05 MED ORDER — DEXTROMETHORPHAN POLISTIREX ER 30 MG/5ML PO SUER
30.0000 mg | Freq: Two times a day (BID) | ORAL | Status: DC
  Administered 2019-03-05 – 2019-03-06 (×2): 30 mg via ORAL
  Filled 2019-03-05 (×3): qty 5

## 2019-03-05 MED ORDER — PANTOPRAZOLE SODIUM 40 MG IV SOLR
40.0000 mg | Freq: Two times a day (BID) | INTRAVENOUS | Status: DC
  Administered 2019-03-05 – 2019-03-06 (×2): 40 mg via INTRAVENOUS
  Filled 2019-03-05 (×2): qty 40

## 2019-03-05 MED ORDER — ACETAMINOPHEN 325 MG PO TABS: 650.0000 mg | ORAL_TABLET | Freq: Four times a day (QID) | ORAL | Status: DC | PRN

## 2019-03-05 MED ORDER — INSULIN ASPART PROT & ASPART (70-30 MIX) 100 UNIT/ML ~~LOC~~ SUSP
70.0000 [IU] | Freq: Two times a day (BID) | SUBCUTANEOUS | Status: DC
  Filled 2019-03-05: qty 10

## 2019-03-05 MED ORDER — FAMOTIDINE 20 MG PO TABS
20.0000 mg | ORAL_TABLET | Freq: Two times a day (BID) | ORAL | Status: DC
  Administered 2019-03-05 – 2019-03-06 (×2): 20 mg via ORAL
  Filled 2019-03-05 (×2): qty 1

## 2019-03-05 MED ORDER — ONDANSETRON HCL 4 MG/2ML IJ SOLN: 4.0000 mg | Freq: Four times a day (QID) | INTRAMUSCULAR | Status: DC | PRN

## 2019-03-05 MED ORDER — PANTOPRAZOLE SODIUM 40 MG IV SOLR
40.0000 mg | Freq: Once | INTRAVENOUS | Status: AC
  Administered 2019-03-05: 40 mg via INTRAVENOUS
  Filled 2019-03-05: qty 40

## 2019-03-05 MED ORDER — INSULIN ASPART 100 UNIT/ML ~~LOC~~ SOLN: 0.0000 [IU] | Freq: Three times a day (TID) | SUBCUTANEOUS | Status: DC

## 2019-03-05 MED ORDER — FERROUS SULFATE 325 (65 FE) MG PO TABS
325.0000 mg | ORAL_TABLET | Freq: Two times a day (BID) | ORAL | Status: DC
  Administered 2019-03-05 – 2019-03-06 (×2): 325 mg via ORAL
  Filled 2019-03-05 (×2): qty 1

## 2019-03-05 MED ORDER — INSULIN ASPART PROT & ASPART (70-30 MIX) 100 UNIT/ML ~~LOC~~ SUSP
70.0000 [IU] | Freq: Two times a day (BID) | SUBCUTANEOUS | Status: DC
  Administered 2019-03-05: 19:00:00 70 [IU] via SUBCUTANEOUS

## 2019-03-05 MED ORDER — POTASSIUM CHLORIDE CRYS ER 20 MEQ PO TBCR
20.0000 meq | EXTENDED_RELEASE_TABLET | Freq: Two times a day (BID) | ORAL | Status: DC
  Administered 2019-03-05 – 2019-03-06 (×2): 20 meq via ORAL
  Filled 2019-03-05 (×2): qty 1

## 2019-03-05 MED ORDER — VITAMIN C 500 MG PO TABS
500.0000 mg | ORAL_TABLET | Freq: Two times a day (BID) | ORAL | Status: DC
  Administered 2019-03-05 – 2019-03-06 (×2): 500 mg via ORAL
  Filled 2019-03-05 (×2): qty 1

## 2019-03-05 MED ORDER — INSULIN ASPART 100 UNIT/ML ~~LOC~~ SOLN: 0.0000 [IU] | Freq: Every day | SUBCUTANEOUS | Status: DC

## 2019-03-05 MED ORDER — LORAZEPAM 0.5 MG PO TABS: 0.5000 mg | ORAL_TABLET | Freq: Three times a day (TID) | ORAL | Status: DC | PRN

## 2019-03-05 MED ORDER — METOLAZONE 5 MG PO TABS
5.0000 mg | ORAL_TABLET | Freq: Every day | ORAL | Status: DC
  Administered 2019-03-05 – 2019-03-06 (×2): 5 mg via ORAL
  Filled 2019-03-05 (×2): qty 1

## 2019-03-05 MED ORDER — LOPERAMIDE HCL 2 MG PO CAPS
2.0000 mg | ORAL_CAPSULE | Freq: Three times a day (TID) | ORAL | Status: DC
  Administered 2019-03-06: 2 mg via ORAL
  Filled 2019-03-05: qty 1

## 2019-03-05 MED ORDER — LACTULOSE 10 GM/15ML PO SOLN
30.0000 g | Freq: Once | ORAL | Status: AC
  Administered 2019-03-05: 30 g via ORAL
  Filled 2019-03-05: qty 60

## 2019-03-05 MED ORDER — ZINC SULFATE 220 (50 ZN) MG PO CAPS
220.0000 mg | ORAL_CAPSULE | Freq: Every day | ORAL | Status: DC
  Administered 2019-03-05 – 2019-03-06 (×2): 220 mg via ORAL
  Filled 2019-03-05 (×2): qty 1

## 2019-03-05 MED ORDER — TRAMADOL HCL 50 MG PO TABS: 50.0000 mg | ORAL_TABLET | Freq: Three times a day (TID) | ORAL | Status: DC | PRN

## 2019-03-05 NOTE — ED Notes (Signed)
Pt cleaned from stool and urine when pt arrived

## 2019-03-05 NOTE — ED Notes (Signed)
Pt was assisted to the bathroom by this tech and EDT caitlyn hall, had a bowel movement.

## 2019-03-05 NOTE — H&P (Signed)
[facility] [place] NAME: Corey West    MR#:  [id number]  DATE OF BIRTH:  1950/12/06  DATE OF ADMISSION:  03/05/2019  PRIMARY CARE PHYSICIAN: Baxter Hire, MD   REQUESTING/REFERRING PHYSICIAN:   Carrie Mew, MD       CHIEF COMPLAINT:   Chief Complaint  Patient presents with  . Dizziness    HISTORY OF PRESENT ILLNESS: Corey West  is a 68 y.o. male with a known history of multiple medical problems including liver cirrhosis, morbid obesity, diabetes type 2, chronic anemia, history of AVMs, chronic kidney disease, depression, GERD, hypertension, hyperlipidemia and sleep apnea who is presenting to the emergency room with complaint of feeling little confused.  Patient however is able to answer all the questions.  He also states that his been feeling weak.  States he received blood as outpatient last week.  He has been seeing Dr. Vira Agar and was planned to have a EGD.  [facility] room is noted to have ammonia that is elevated.  Were asked to admit him.  He denies any fevers chills no chest pain palpitations.  PAST MEDICAL HISTORY:   Past Medical History:  Diagnosis Date  . Anemia   . Anxiety    controlled;   . Arthritis   . AVM (arteriovenous malformation) of stomach, acquired with hemorrhage   . Barrett's esophagus   . Chronic kidney disease    renal infufficiency  . Cirrhosis ([place])   . Depression    controlled;   Marland Kitchen Diabetes mellitus without complication ([place])    not controlled, taking insulin but sugar continues to run high;   . Edema   . Esophageal varices ([place])   . GAVE (gastric antral vascular ectasia)   . GERD (gastroesophageal reflux disease)   . History of hiatal hernia   . Hyperlipidemia   . Hypertension    controlled well;   . Liver cirrhosis ([place])   . Nephropathy, diabetic ([place])   . Obesity   . Pancytopenia ([place])   . Polyp, stomach    with chronic blood loss  . Sleep apnea    does not wear a cpap, Medicare would  not pay for it   . Venous stasis dermatitis of both lower extremities   . Venous stasis of both lower extremities    with cellulitis    PAST SURGICAL HISTORY:  Past Surgical History:  Procedure Laterality Date  . ESOPHAGOGASTRODUODENOSCOPY N/A 02/09/2015   Procedure: ESOPHAGOGASTRODUODENOSCOPY (EGD);  Surgeon: Manya Silvas, MD;  Location: [facility] ENDOSCOPY;  Service: Endoscopy;  Laterality: N/A;  . ESOPHAGOGASTRODUODENOSCOPY N/A 07/22/2015   Procedure: ESOPHAGOGASTRODUODENOSCOPY (EGD);  Surgeon: Manya Silvas, MD;  Location: [facility] ENDOSCOPY;  Service: Endoscopy;  Laterality: N/A;  . ESOPHAGOGASTRODUODENOSCOPY N/A 06/26/2017   Procedure: ESOPHAGOGASTRODUODENOSCOPY (EGD);  Surgeon: Manya Silvas, MD;  Location: [facility] ENDOSCOPY;  Service: Endoscopy;  Laterality: N/A;  . ESOPHAGOGASTRODUODENOSCOPY N/A 06/27/2017   Procedure: ESOPHAGOGASTRODUODENOSCOPY (EGD);  Surgeon: Manya Silvas, MD;  Location: [facility] ENDOSCOPY;  Service: Endoscopy;  Laterality: N/A;  . ESOPHAGOGASTRODUODENOSCOPY N/A 06/28/2017   Procedure: ESOPHAGOGASTRODUODENOSCOPY (EGD);  Surgeon: Manya Silvas, MD;  Location: [facility] ENDOSCOPY;  Service: Endoscopy;  Laterality: N/A;  . ESOPHAGOGASTRODUODENOSCOPY Bilateral 10/11/2017   Procedure: ESOPHAGOGASTRODUODENOSCOPY (EGD);  Surgeon: Manya Silvas, MD;  Location: [facility] ENDOSCOPY;  Service: Endoscopy;  Laterality: Bilateral;  . ESOPHAGOGASTRODUODENOSCOPY N/A 12/04/2017   Procedure: ESOPHAGOGASTRODUODENOSCOPY (EGD);  Surgeon: Manya Silvas, MD;  Location: [facility] ENDOSCOPY;  Service: Endoscopy;  Laterality: N/A;  .  ESOPHAGOGASTRODUODENOSCOPY (EGD) WITH PROPOFOL N/A 07/20/2015   Procedure: ESOPHAGOGASTRODUODENOSCOPY (EGD) WITH PROPOFOL;  Surgeon: Manya Silvas, MD;  Location: [facility] ENDOSCOPY;  Service: Endoscopy;  Laterality: N/A;  . ESOPHAGOGASTRODUODENOSCOPY (EGD) WITH PROPOFOL N/A 09/16/2015   Procedure: ESOPHAGOGASTRODUODENOSCOPY (EGD) WITH PROPOFOL;  Surgeon: Manya Silvas, MD;  Location: [facility] ENDOSCOPY;  Service: Endoscopy;  Laterality: N/A;  . ESOPHAGOGASTRODUODENOSCOPY (EGD) WITH PROPOFOL N/A 03/16/2016   Procedure: ESOPHAGOGASTRODUODENOSCOPY (EGD) WITH PROPOFOL;  Surgeon: Manya Silvas, MD;  Location: [facility] ENDOSCOPY;  Service: Endoscopy;  Laterality: N/A;  . ESOPHAGOGASTRODUODENOSCOPY (EGD) WITH PROPOFOL N/A 09/14/2016   Procedure: ESOPHAGOGASTRODUODENOSCOPY (EGD) WITH PROPOFOL;  Surgeon: Manya Silvas, MD;  Location: [facility] ENDOSCOPY;  Service: Endoscopy;  Laterality: N/A;  . ESOPHAGOGASTRODUODENOSCOPY (EGD) WITH PROPOFOL N/A 11/05/2016   Procedure: ESOPHAGOGASTRODUODENOSCOPY (EGD) WITH PROPOFOL;  Surgeon: Manya Silvas, MD;  Location: [facility] ENDOSCOPY;  Service: Endoscopy;  Laterality: N/A;  . ESOPHAGOGASTRODUODENOSCOPY (EGD) WITH PROPOFOL N/A 02/06/2017   Procedure: ESOPHAGOGASTRODUODENOSCOPY (EGD) WITH PROPOFOL;  Surgeon: Manya Silvas, MD;  Location: [facility] ENDOSCOPY;  Service: Endoscopy;  Laterality: N/A;  . ESOPHAGOGASTRODUODENOSCOPY (EGD) WITH PROPOFOL N/A 04/24/2017   Procedure: ESOPHAGOGASTRODUODENOSCOPY (EGD) WITH PROPOFOL;  Surgeon: Manya Silvas, MD;  Location: [facility] ENDOSCOPY;  Service: Endoscopy;  Laterality: N/A;  . ESOPHAGOGASTRODUODENOSCOPY (EGD) WITH PROPOFOL N/A 07/31/2017   Procedure: ESOPHAGOGASTRODUODENOSCOPY (EGD) WITH PROPOFOL;  Surgeon: Manya Silvas, MD;  Location: [facility] ENDOSCOPY;  Service: Endoscopy;  Laterality: N/A;  . ESOPHAGOGASTRODUODENOSCOPY (EGD) WITH PROPOFOL N/A 08/08/2018   Procedure: ESOPHAGOGASTRODUODENOSCOPY (EGD) WITH PROPOFOL;  Surgeon: Manya Silvas, MD;  Location: [facility] ENDOSCOPY;  Service: Endoscopy;  Laterality: N/A;  . GIVENS CAPSULE STUDY N/A 12/05/2017   Procedure: GIVENS CAPSULE STUDY;  Surgeon: Manya Silvas, MD;  Location: [facility] ENDOSCOPY;  Service: Endoscopy;  Laterality: N/A;  . TONSILLECTOMY    . TONSILLECTOMY AND ADENOIDECTOMY    . ULNAR NERVE TRANSPOSITION    .  UVULOPALATOPHARYNGOPLASTY      SOCIAL HISTORY:  Social History   Tobacco Use  . Smoking status: Current Some Day Smoker    Years: 20.00    Types: Cigars  . Smokeless tobacco: Never Used  Substance Use Topics  . Alcohol use: No    Comment: stopped 15 years ago    FAMILY HISTORY:  Family History  Problem Relation Age of Onset  . Diabetes Other   . Transient ischemic attack Father   . CAD Father     DRUG ALLERGIES: No Known Allergies  REVIEW OF SYSTEMS:   CONSTITUTIONAL: No fever, fatigue or weakness.  EYES: No blurred or double vision.  EARS, NOSE, AND THROAT: No tinnitus or ear pain.  RESPIRATORY: No cough, shortness of breath, wheezing or hemoptysis.  CARDIOVASCULAR: No chest pain, orthopnea, edema.  GASTROINTESTINAL: No nausea, vomiting, diarrhea or abdominal pain.  GENITOURINARY: No dysuria, hematuria.  ENDOCRINE: No polyuria, nocturia,  HEMATOLOGY: No anemia, easy bruising or bleeding SKIN: No rash or lesion. MUSCULOSKELETAL: No joint pain or arthritis.   NEUROLOGIC: Some confusion PSYCHIATRY: No anxiety or depression.   MEDICATIONS AT HOME:  Prior to Admission medications   Medication Sig Start Date End Date Taking? Authorizing Provider  atorvastatin (LIPITOR) 40 MG tablet Take 1 tablet (40 mg total) by mouth daily at 6 PM. 02/05/19   Gouru, Aruna, MD  cetirizine (ZYRTEC) 10 MG tablet Take 10 mg by mouth daily.    [provider]  dextromethorphan (DELSYM) 30 MG/5ML liquid Take 30 mg by mouth 2 (two) times daily.    [provider]  famotidine (PEPCID) 20 MG tablet Take 20 mg by mouth 2 (two) times daily. 01/13/19   [provider]  ferrous sulfate 325 (65 FE) MG tablet Take 325 mg by mouth 2 (two) times daily with a meal.     [provider]  furosemide (LASIX) 40 MG tablet Take 40 mg by mouth 2 (two) times a day.  01/13/19   [provider]  insulin aspart protamine - aspart (NOVOLOG 70/30 MIX) (70-30) 100 UNIT/ML  FlexPen Inject 70-84 Units into the skin See admin instructions. Inject 84u under the skin every morning and 70u under the skin every evening    [provider]  lactulose (CHRONULAC) 10 GM/15ML solution Take 30 g by mouth 4 (four) times daily.  12/11/18   [provider]  loperamide (IMODIUM) 2 MG capsule Take 2 mg by mouth 3 (three) times daily.    [provider]  LORazepam (ATIVAN) 0.5 MG tablet Take 1 tablet (0.5 mg total) by mouth every 8 (eight) hours as needed for anxiety. 10/18/18   Gladstone Lighter, MD  metolazone (ZAROXOLYN) 5 MG tablet Take 5 mg by mouth daily.    [provider]  Multiple Vitamin (MULTIVITAMIN) tablet Take 1 tablet by mouth daily.    [provider]  omeprazole (PRILOSEC) 40 MG capsule Take 40 mg by mouth daily.    [provider]  Potassium Chloride ER 20 MEQ TBCR Take 20 mEq by mouth 2 (two) times daily. While on lasix' 10/18/18   Gladstone Lighter, MD  rifaximin (XIFAXAN) 550 MG TABS tablet Take 1 tablet (550 mg total) by mouth 2 (two) times daily. Patient not taking: Reported on 02/03/2019 01/01/18   Loletha Grayer, MD  traMADol (ULTRAM) 50 MG tablet Take 50 mg by mouth 3 (three) times daily as needed for moderate pain.  12/24/18   [provider]  vitamin C (ASCORBIC ACID) 250 MG tablet Take 500 mg by mouth 2 (two) times daily.    [provider]  zinc sulfate 220 (50 Zn) MG capsule Take 220 mg by mouth daily.    [provider]      PHYSICAL EXAMINATION:   VITAL SIGNS: Blood pressure (!) 168/58, pulse 71, resp. rate 15, SpO2 100 %.  GENERAL:  68 y.o.-year-old patient lying in the bed with no acute distress.  EYES: Pupils equal, round, reactive to light and accommodation. No scleral icterus. Extraocular muscles intact.  HEENT: Head atraumatic, normocephalic. Oropharynx and nasopharynx clear.  NECK:  Supple, no jugular venous distention. No thyroid enlargement, no tenderness.   LUNGS: Normal breath sounds bilaterally, no wheezing, rales,rhonchi or crepitation. No use of accessory muscles of respiration.  CARDIOVASCULAR: S1, S2 normal. No murmurs, rubs, or gallops.  ABDOMEN: Soft, nontender, nondistended. Bowel sounds present. No organomegaly or mass.  EXTREMITIES: Positive bilateral lower extremity edema with redness  nEUROLOGIC: Cranial nerves II through XII are intact. Muscle strength 5/5 in all extremities. Sensation intact. Gait not checked.  PSYCHIATRIC: The patient is alert and oriented x 3.  SKIN: No obvious rash, lesion, or ulcer.   LABORATORY PANEL:   CBC No results for input(s): WBC, HGB, HCT, PLT, MCV, MCH, MCHC, RDW, LYMPHSABS, MONOABS, EOSABS, BASOSABS, BANDABS in the last 168 hours.  Invalid input(s): NEUTRABS, BANDSABD ------------------------------------------------------------------------------------------------------------------  Chemistries  Recent Labs  Lab 03/05/19 1050  NA 141  K 3.2*  CL 100  CO2 27  GLUCOSE 129*  BUN 35*  CREATININE 2.26*  CALCIUM 9.1  AST 30  ALT 25  ALKPHOS 75  BILITOT 0.8   ------------------------------------------------------------------------------------------------------------------ CrCl cannot be calculated (Unknown ideal weight.). ------------------------------------------------------------------------------------------------------------------ No results for input(s): TSH, T4TOTAL, T3FREE, THYROIDAB in the last 72 hours.  Invalid input(s): FREET3   Coagulation profile No results for input(s): INR, PROTIME in the last 168 hours. ------------------------------------------------------------------------------------------------------------------- No results for input(s): DDIMER in the last 72 hours. -------------------------------------------------------------------------------------------------------------------  Cardiac Enzymes No results for input(s): CKMB, TROPONINI, MYOGLOBIN in the last 168  hours.  Invalid input(s): CK ------------------------------------------------------------------------------------------------------------------ Invalid input(s): POCBNP  ---------------------------------------------------------------------------------------------------------------  Urinalysis    Component Value Date/Time   COLORURINE YELLOW (A) 02/04/2019 0255   APPEARANCEUR HAZY (A) 02/04/2019 0255   APPEARANCEUR Clear 09/10/2014 0456   LABSPEC 1.017 02/04/2019 0255   LABSPEC 1.016 09/10/2014 0456   PHURINE 5.0 02/04/2019 0255   GLUCOSEU 50 (A) 02/04/2019 0255   GLUCOSEU Negative 09/10/2014 0456   HGBUR MODERATE (A) 02/04/2019 0255   BILIRUBINUR NEGATIVE 02/04/2019 0255   BILIRUBINUR Negative 09/10/2014 0456   KETONESUR NEGATIVE 02/04/2019 0255   PROTEINUR 100 (A) 02/04/2019 0255   NITRITE NEGATIVE 02/04/2019 0255   LEUKOCYTESUR SMALL (A) 02/04/2019 0255   LEUKOCYTESUR Negative 09/10/2014 0456     RADIOLOGY: No results found.  EKG: Orders placed or performed during the hospital encounter of 02/02/19  . ED EKG 12-Lead  . ED EKG 12-Lead    IMPRESSION AND PLAN: Patient 68 year old morbidly obese white male with liver cirrhosis presenting with generalized weakness dizziness some confusion  1.  Mild hepatic encephalopathy ammonia level is high He is not taking rifaximin will resume diet We will continue continue lactulose he is received extra dose in the ER  2.  Anemia with history of GI bleed patient was supposed to have endoscopy as outpatient GI will be consulted will place on PPIs twice daily  3.  Diabetes type 2 we will place on sliding scale insulin we will continue his home regimen of 70/30 insulin   4.  Hyperlipidemia continue Lipitor  5.  History of congestive heart failure currently compensated continue oral Lasix and Zaroxolyn  6.  GERD he will be on Protonix IV  7.  Miscellaneous SCDs for DVT prophylaxis        All the records are reviewed and  case discussed with ED provider. Management plans discussed with the patient, family and they are in agreement.  CODE STATUS: Code Status History    Date Active Date Inactive Code Status Order ID Comments User Context   02/03/2019 0541 02/05/2019 1436 Full Code [id number]  Harrie Foreman, MD Inpatient   01/15/2019 0047 01/15/2019 1657 Full Code [id number]  Lance Coon, MD Inpatient   12/02/2018 1713 12/09/2018 1737 Full Code [id number]  Hillary Bow, MD ED   10/16/2018 0054 10/18/2018 1842 Full Code [id number]  [name], MD Inpatient   07/18/2018 2339 07/21/2018 1356 Full Code [id number]  Lance Coon, MD Inpatient   06/18/2018 1609 06/19/2018 1717 Full Code [id number]  Epifanio Lesches, MD ED   12/31/2017 1856 01/01/2018 1205 Full Code [id number]  Saundra Shelling, MD Inpatient   12/25/2017 1456 12/26/2017 1618 Full Code [id number]  Epifanio Lesches, MD ED   12/16/2017 0921 12/18/2017 1635 DNR [id number]  Max Sane, MD Inpatient   12/16/2017 0305 12/16/2017 0921 Full Code [id number]  Salary, Avel Peace, MD ED   12/02/2017 2003 12/05/2017 2000 Full Code [id number]  Saundra Shelling, MD Inpatient   11/21/2017 1948 11/23/2017 1958 Full Code [id number]  Loletha Grayer, MD ED   10/23/2017 1405 10/26/2017 1402 Full Code [id number]  Bettey Costa, MD Inpatient   10/18/2017 0058 10/19/2017 1806 Full Code [id number]  Saundra Shelling, MD Inpatient   10/09/2017 2139 10/11/2017 1806 Full Code [id number]  [name], MD Inpatient   09/03/2017 2124 09/05/2017 1552 Full Code [id number]  Nicholes Mango, MD Inpatient   06/26/2017 1119 06/28/2017 1748 Full Code [id number]  Henreitta Leber, MD Inpatient   02/21/2017 2007 02/22/2017 1803 Full Code [id number]  [name], MD Inpatient   07/20/2015 1245 07/22/2015 2217 Full Code [id number]  Nicholes Mango, MD Inpatient   02/08/2015 2331 02/11/2015 1943 Full Code [id number]  Hower, Aaron Mose, MD ED       TOTAL TIME TAKING CARE OF THIS PATIENT: 31minutes.    Dustin Flock  M.D on 03/05/2019 at 1:15 PM  Between 7am to 6pm - Pager - [phone]  After 6pm go to [email or web address] - password [email or web address] [facility]  [phone]  CC: Primary care physician; Baxter Hire, MD

## 2019-03-05 NOTE — ED Notes (Signed)
.. ED TO INPATIENT HANDOFF REPORT  ED Nurse Name and Phone #: Delton Coombes  S Name/Age/Gender Corey West 68 y.o. male Room/Bed: ED02A/ED02A  Code Status   Code Status: Prior  Home/SNF/Other Home Patient oriented to: self Is this baseline? No   Triage Complete: Triage complete  Chief Complaint dizziness  Triage Note PT from home via EMS, c/o dizziness and hallucinations. VSS, A&OX4    Allergies No Known Allergies  Level of Care/Admitting Diagnosis ED Disposition    ED Disposition Condition [facility] Area: [place] [[id number]]  Level of Care: Med-Surg [16]  Covid Evaluation: Screening Protocol (No Symptoms)  Diagnosis: Hepatic encephalopathy ([place]) [572.2.ICD-9-CM]  Admitting Physician: Dustin Flock [[id number]]  Attending Physician: Dustin Flock [[id number]]  Estimated length of stay: past midnight tomorrow  Certification:: I certify this patient will need inpatient services for at least 2 midnights  PT Class (Do Not Modify): Inpatient [101]  PT Acc Code (Do Not Modify): Private [1]       B Medical/Surgery History Past Medical History:  Diagnosis Date  . Anemia   . Anxiety    controlled;   . Arthritis   . AVM (arteriovenous malformation) of stomach, acquired with hemorrhage   . Barrett's esophagus   . Chronic kidney disease    renal infufficiency  . Cirrhosis ([place])   . Depression    controlled;   Marland Kitchen Diabetes mellitus without complication ([place])    not controlled, taking insulin but sugar continues to run high;   . Edema   . Esophageal varices ([place])   . GAVE (gastric antral vascular ectasia)   . GERD (gastroesophageal reflux disease)   . History of hiatal hernia   . Hyperlipidemia   . Hypertension    controlled well;   . Liver cirrhosis ([place])   . Nephropathy, diabetic ([place])   . Obesity   . Pancytopenia ([place])   . Polyp, stomach    with chronic blood loss  . Sleep apnea    does not wear a cpap, Medicare would  not pay for it   . Venous stasis dermatitis of both lower extremities   . Venous stasis of both lower extremities    with cellulitis   Past Surgical History:  Procedure Laterality Date  . ESOPHAGOGASTRODUODENOSCOPY N/A 02/09/2015   Procedure: ESOPHAGOGASTRODUODENOSCOPY (EGD);  Surgeon: Manya Silvas, MD;  Location[facility] ENDOSCOPY;  Service: Endoscopy;  Laterality: N/A;  . ESOPHAGOGASTRODUODENOSCOPY N/A 07/22/2015   Procedure: ESOPHAGOGASTRODUODENOSCOPY (EGD);  Surgeon: Manya Silvas, MD;  Location: [facility] ENDOSCOPY;  Service: Endoscopy;  Laterality: N/A;  . ESOPHAGOGASTRODUODENOSCOPY N/A 06/26/2017   Procedure: ESOPHAGOGASTRODUODENOSCOPY (EGD);  Surgeon: Manya Silvas, MD;  Location: [facility] ENDOSCOPY;  Service: Endoscopy;  Laterality: N/A;  . ESOPHAGOGASTRODUODENOSCOPY N/A 06/27/2017   Procedure: ESOPHAGOGASTRODUODENOSCOPY (EGD);  Surgeon: Manya Silvas, MD;  Location: [facility] ENDOSCOPY;  Service: Endoscopy;  Laterality: N/A;  . ESOPHAGOGASTRODUODENOSCOPY N/A 06/28/2017   Procedure: ESOPHAGOGASTRODUODENOSCOPY (EGD);  Surgeon: Manya Silvas, MD;  Location[facility]COPY;  Service: Endoscopy;  Laterality: N/A;  . ESOPHAGOGASTRODUODENOSCOPY Bilateral 10/11/2017   Procedure: ESOPHAGOGASTRODUODENOSCOPY (EGD);  Surgeon: Manya Silvas, MD;  Location: [facility] ENDOSCOPY;  Service: Endoscopy;  Laterality: Bilateral;  . ESOPHAGOGASTRODUODENOSCOPY N/A 12/04/2017   Procedure: ESOPHAGOGASTRODUODENOSCOPY (EGD);  Surgeon: Manya Silvas, MD;  Location: [facility] ENDOSCOPY;  Service: Endoscopy;  Laterality: N/A;  . ESOPHAGOGASTRODUODENOSCOPY (EGD) WITH PROPOFOL N/A 07/20/2015   Procedure: ESOPHAGOGASTRODUODENOSCOPY (EGD) WITH PROPOFOL;  Surgeon: Manya Silvas, MD;  Location: [place];  Service: Endoscopy;  Laterality: N/A;  . ESOPHAGOGASTRODUODENOSCOPY (EGD) WITH PROPOFOL N/A 09/16/2015   Procedure: ESOPHAGOGASTRODUODENOSCOPY (EGD) WITH PROPOFOL;  Surgeon: Manya Silvas, MD;  Location: [facility]  ENDOSCOPY;  Service: Endoscopy;  Laterality: N/A;  . ESOPHAGOGASTRODUODENOSCOPY (EGD) WITH PROPOFOL N/A 03/16/2016   Procedure: ESOPHAGOGASTRODUODENOSCOPY (EGD) WITH PROPOFOL;  Surgeon: Manya Silvas, MD;  Location: [facility] ENDOSCOPY;  Service: Endoscopy;  Laterality: N/A;  . ESOPHAGOGASTRODUODENOSCOPY (EGD) WITH PROPOFOL N/A 09/14/2016   Procedure: ESOPHAGOGASTRODUODENOSCOPY (EGD) WITH PROPOFOL;  Surgeon: Manya Silvas, MD;  Location: [facility] ENDOSCOPY;  Service: Endoscopy;  Laterality: N/A;  . ESOPHAGOGASTRODUODENOSCOPY (EGD) WITH PROPOFOL N/A 11/05/2016   Procedure: ESOPHAGOGASTRODUODENOSCOPY (EGD) WITH PROPOFOL;  Surgeon: Manya Silvas, MD;  Location: [facility] ENDOSCOPY;  Service: Endoscopy;  Laterality: N/A;  . ESOPHAGOGASTRODUODENOSCOPY (EGD) WITH PROPOFOL N/A 02/06/2017   Procedure: ESOPHAGOGASTRODUODENOSCOPY (EGD) WITH PROPOFOL;  Surgeon: Manya Silvas, MD;  Location: [facility] ENDOSCOPY;  Service: Endoscopy;  Laterality: N/A;  . ESOPHAGOGASTRODUODENOSCOPY (EGD) WITH PROPOFOL N/A 04/24/2017   Procedure: ESOPHAGOGASTRODUODENOSCOPY (EGD) WITH PROPOFOL;  Surgeon: Manya Silvas, MD;  Location: [facility] ENDOSCOPY;  Service: Endoscopy;  Laterality: N/A;  . ESOPHAGOGASTRODUODENOSCOPY (EGD) WITH PROPOFOL N/A 07/31/2017   Procedure: ESOPHAGOGASTRODUODENOSCOPY (EGD) WITH PROPOFOL;  Surgeon: Manya Silvas, MD;  Location: [facility] ENDOSCOPY;  Service: Endoscopy;  Laterality: N/A;  . ESOPHAGOGASTRODUODENOSCOPY (EGD) WITH PROPOFOL N/A 08/08/2018   Procedure: ESOPHAGOGASTRODUODENOSCOPY (EGD) WITH PROPOFOL;  Surgeon: Manya Silvas, MD;  Location: [facility] ENDOSCOPY;  Service: Endoscopy;  Laterality: N/A;  . GIVENS CAPSULE STUDY N/A 12/05/2017   Procedure: GIVENS CAPSULE STUDY;  Surgeon: Manya Silvas, MD;  Location: [facility] ENDOSCOPY;  Service: Endoscopy;  Laterality: N/A;  . TONSILLECTOMY    . TONSILLECTOMY AND ADENOIDECTOMY    . ULNAR NERVE TRANSPOSITION    . UVULOPALATOPHARYNGOPLASTY       A IV  Location/Drains/Wounds Patient Lines/Drains/Airways Status   Active Line/Drains/Airways    Name:   Placement date:   Placement time:   Site:   Days:   Peripheral IV 03/05/19 Left Wrist   03/05/19    1120    Wrist   less than 1   Wound / Incision (Open or Dehisced) 01/15/19 Venous stasis ulcer Leg Right Scabbed, white unopened area   01/15/19    0131    Leg   49   Wound / Incision (Open or Dehisced) 01/15/19 Venous stasis ulcer Leg Left white unopened area   01/15/19    0232    Leg   49          Intake/Output Last 24 hours No intake or output data in the 24 hours ending 03/05/19 1426  Labs/Imaging Results for orders placed or performed during the hospital encounter of 03/05/19 (from the past 48 hour(s))  Ammonia     Status: Abnormal   Collection Time: 03/05/19 10:50 AM  Result Value Ref Range   Ammonia 105 (H) 9 - 35 umol/L    Comment: Performed at [facility], [address] [place], [place] [address]  CBC with Differential/Platelet     Status: Abnormal   Collection Time: 03/05/19 10:50 AM  Result Value Ref Range   WBC 4.7 4.0 - 10.5 K/uL   RBC 2.70 (L) 4.22 - 5.81 MIL/uL   Hemoglobin 8.3 (L) 13.0 - 17.0 g/dL   HCT 25.1 (L) 39.0 - 52.0 %   MCV 93.0 80.0 - 100.0 fL   MCH 30.7 26.0 - 34.0 pg   MCHC 33.1 30.0 - 36.0 g/dL   RDW 15.0  11.5 - 15.5 %   Platelets 115 (L) 150 - 400 K/uL   Neutrophils Relative % 73 %   Neutro Abs 3.4 1.7 - 7.7 K/uL   Lymphocytes Relative 13 %   Lymphs Abs 0.6 (L) 0.7 - 4.0 K/uL   Monocytes Relative 10 %   Monocytes Absolute 0.5 0.1 - 1.0 K/uL   Eosinophils Relative 4 %   Eosinophils Absolute 0.2 0.0 - 0.5 K/uL   Basophils Relative 0 %   Basophils Absolute 0.0 0.0 - 0.1 K/uL    Comment: Performed at [facility], [address] [place], [place] [address]  Lactic acid, plasma     Status: None   Collection Time: 03/05/19 10:50 AM  Result Value Ref Range   Lactic Acid, Venous 1.4 0.5 - 1.9 mmol/L    Comment: Performed at  [facility], [address], [place], [place] [address]  Protime-INR     Status: None   Collection Time: 03/05/19 10:50 AM  Result Value Ref Range   Prothrombin Time 15.1 11.4 - 15.2 seconds   INR 1.2 0.8 - 1.2    Comment: Performed at [facility], [address] [place], [place] [address]  APTT     Status: None   Collection Time: 03/05/19 10:50 AM  Result Value Ref Range   aPTT 28 24 - 36 seconds    Comment: Performed at [facility], [address] [place], [place] [address]  Urinalysis, Complete w Microscopic     Status: Abnormal   Collection Time: 03/05/19 10:50 AM  Result Value Ref Range   Color, Urine YELLOW YELLOW    Comment: Performed at [facility], [address], [place], [place] [address] CORRECTED ON 06/04 AT 1638: PREVIOUSLY REPORTED AS YELLOW    APPearance HAZY (A) CLEAR    Comment: Performed at [facility], [address] [place], [place] [address]   Specific Gravity, Urine 1.008 1.005 - 1.030    Comment: Performed at [facility], [address] [place], [place] [address]   pH 6.0 5.0 - 8.0    Comment: Performed at [facility], [address] [place], [place] [address]   Glucose, UA NEGATIVE NEGATIVE mg/dL    Comment: Performed at [facility], [address] [place], [place] [address]   Hgb urine dipstick NEGATIVE NEGATIVE    Comment: Performed at [facility], [address] [place], [place] [address]   Bilirubin Urine NEGATIVE NEGATIVE    Comment: Performed at [facility], [address], [place], [place] [address]   Ketones, ur NEGATIVE NEGATIVE mg/dL    Comment: Performed at [facility], [address] [place], [place] [address]   Protein, ur 30 (A) NEGATIVE mg/dL    Comment: Performed at [facility], [address] [place], [place] [address]   Nitrite NEGATIVE NEGATIVE    Comment: Performed at [facility], [address], [place], [place] [address]   [place]: Performed at [facility], [address] [place],  [address]   Squamous Epithelial / LPF 0-5 0 - 5   WBC, UA 0-5 0 - 5 WBC/hpf   RBC / HPF 0-5 0 - 5 RBC/hpf   Bacteria, UA NONE SEEN NONE SEEN    Comment: Performed at [facility], [address], [place],  [address]  Comprehensive metabolic panel     Status: Abnormal  Collection Time: 03/05/19 10:50 AM  Result Value Ref Range   Sodium 141 135 - 145 mmol/L   Potassium 3.2 (L) 3.5 - 5.1 mmol/L   Chloride 100 98 - 111 mmol/L   CO2 27 22 - 32 mmol/L   Glucose, Bld 129 (H) 70 - 99 mg/dL   BUN 35 (H) 8 - 23 mg/dL   Creatinine, Ser 2.26 (H) 0.61 - 1.24 mg/dL   Calcium 9.1 8.9 - 10.3 mg/dL   Total Protein 6.6 6.5 - 8.1 g/dL   Albumin 2.9 (L) 3.5 - 5.0 g/dL   AST 30 15 - 41 U/L   ALT 25 0 - 44 U/L   Alkaline Phosphatase 75 38 - 126 U/L   Total Bilirubin 0.8 0.3 - 1.2 mg/dL   GFR calc non Af Amer 29 (L) >60 mL/min   GFR calc Af Amer 34 (L) >60 mL/min   Anion gap 14 5 - 15    Comment: Performed at [facility], [address], [place], [place] [address]   No results found.  Pending Labs Unresulted Labs (From admission, onward)    Start     Ordered   03/06/19 0500  Ammonia  Tomorrow morning,   STAT     03/05/19 1330   03/05/19 1331  Occult blood card to lab, stool  Once,   STAT     03/05/19 1330   03/05/19 1312  SARS Coronavirus 2 (CEPHEID - Performed in [place] hospital lab), Hosp Order  (Asymptomatic Patients Labs)  ONCE - STAT,   STAT    Question:  Rule Out  Answer:  Yes   03/05/19 1311   03/05/19 1050  Urine Culture  Once,   R     03/05/19 1050   Signed and Held  CBC  Tomorrow morning,   R     Signed and Held   Signed and Held  Basic metabolic panel  Tomorrow morning,   R     Signed and Held          Vitals/Pain Today's Vitals   03/05/19 1034 03/05/19 1038 03/05/19 1200  BP:   (!)  168/58  Pulse:  77 71  Resp:  16 15  SpO2:  99% 100%  PainSc: 0-No pain      Isolation Precautions No active isolations  Medications Medications  pantoprazole (PROTONIX) injection 40 mg (has no administration in time range)  insulin aspart (novoLOG) injection 0-9 Units (has no administration in time range)  insulin aspart (novoLOG) injection 0-5 Units (has no administration in time range)  insulin aspart protamine- aspart (NOVOLOG MIX 70/30) injection 70 Units (has no administration in time range)  pantoprazole (PROTONIX) injection 40 mg (40 mg Intravenous Given 03/05/19 1323)  lactulose (CHRONULAC) 10 GM/15ML solution 30 g (30 g Oral Given 03/05/19 1321)    Mobility walks with person assist High fall risk   Focused Assessments Neuro Assessment Handoff:  Swallow screen pass? Yes          Neuro Assessment: Exceptions to WDL Neuro Checks:      Last Documented NIHSS Modified Score:   Has TPA been given? No If patient is a Neuro Trauma and patient is going to OR before floor call report to [place] nurse: [phone] or [phone]     R Recommendations: See Admitting Provider Note  Report given to:   Additional Notes:

## 2019-03-05 NOTE — Progress Notes (Signed)
Went to see the patient as GI is consulted for anemia and hepatic encephalopathy.  He is a patient of Dr. Percell Boston. Patient initially asked for Dr. Vira Agar and when I explained to him that I am the gastroenterologist covering today and Dr. Vira Agar does not see patients admitted to the hospital.  I did mention to him that his team does cover the hospital.  He then requested to be seen by [facility] clinic GI.  Dr. Posey Pronto and Dr. [name] are made aware. Dr. [name] will see the patient tomorrow   Cephas Darby, MD [address]  [place]  [place], [place] [address]  Main: [phone]  Fax: [phone] Pager: [phone]

## 2019-03-05 NOTE — Progress Notes (Signed)
Advanced care plan.  Purpose of the Encounter: CODE STATUS  Parties in Attendance: Patient himself  Patient's Decision Capacity: Intact  Subjective/Patient's story: Corey West  is a 68 y.o. male with a known history of multiple medical problems including liver cirrhosis, morbid obesity, diabetes type 2, chronic anemia, history of AVMs, chronic kidney disease, depression, GERD, hypertension, hyperlipidemia and sleep apnea who is presenting to the emergency room with complaint of feeling little confused.  Patient however is able to answer all the questions.  He also states that his been feeling weak.  States he received blood as outpatient last week.  He has been seeing Dr. Vira Agar and was planned to have a EGD.    Objective/Medical story  Due to patient's multiple medical problems I discussed with him regarding his desires for cardiac and pulmonary resuscitation.  Please see report that entails.  Also discussed him with her regarding living will and healthcare power of attorney  Goals of care determination:   Patient states that he wants to be a full code and wants everything to be done strongly recommended he obtain healthcare power of attorney and work on his living will  CODE STATUS: Full code   Time spent discussing advanced care planning: 16 minutes

## 2019-03-05 NOTE — ED Triage Notes (Signed)
PT from home via EMS, c/o dizziness and hallucinations. VSS, A&OX4

## 2019-03-05 NOTE — ED Provider Notes (Signed)
[facility] Emergency Department Provider Note  ____________________________________________  Time seen: Approximately 1:24 PM  I have reviewed the triage vital signs and the nursing notes.   HISTORY  Chief Complaint Dizziness    Level 5 Caveat: Portions of the History and Physical including HPI and review of systems are unable to be completely obtained due to patient being a poor historian   HPI Corey West is a 68 y.o. male with a history of Barrett's esophagus, CKD, cirrhosis, diabetes, hypertension, esophageal varices who comes the ED complaining of confusion, dizziness on standing, generalized weakness.  States that he is also in the past had black stool but this appears to be normal in the last 1 or 2 days.  Denies any nausea vomiting or abdominal pain.  He is following up with gastroenterology Dr. Vira Agar.  Last had a transfusion about 2 weeks ago.  He feels confused about whether it is day or night or other parameters of time keeping.      Past Medical History:  Diagnosis Date  . Anemia   . Anxiety    controlled;   . Arthritis   . AVM (arteriovenous malformation) of stomach, acquired with hemorrhage   . Barrett's esophagus   . Chronic kidney disease    renal infufficiency  . Cirrhosis ([place])   . Depression    controlled;   Marland Kitchen Diabetes mellitus without complication ([place])    not controlled, taking insulin but sugar continues to run high;   . Edema   . Esophageal varices ([place])   . GAVE (gastric antral vascular ectasia)   . GERD (gastroesophageal reflux disease)   . History of hiatal hernia   . Hyperlipidemia   . Hypertension    controlled well;   . Liver cirrhosis ([place])   . Nephropathy, diabetic ([place])   . Obesity   . Pancytopenia ([place])   . Polyp, stomach    with chronic blood loss  . Sleep apnea    does not wear a cpap, Medicare would not pay for it   . Venous stasis dermatitis of both lower extremities   . Venous stasis of both lower  extremities    with cellulitis     Patient Active Problem List   Diagnosis Date Noted  . HCAP (healthcare-associated pneumonia) 02/03/2019  . Aphasia 01/14/2019  . Acute on chronic renal failure ([place]) 01/14/2019  . Malnutrition of moderate degree 12/03/2018  . C. difficile diarrhea 12/02/2018  . Ankle fracture, left, closed, initial encounter 10/15/2018  . Ankle fracture, right, closed, initial encounter 10/15/2018  . Ankle fracture 10/15/2018  . Pressure injury of skin 07/19/2018  . UTI (urinary tract infection) 07/18/2018  . CKD (chronic kidney disease) stage 3, GFR 30-59 ml/min (HCC) 01/02/2018  . 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 Goals of care, counseling/discussion   . Palliative care encounter   . Hepatic encephalopathy syndrome ([place]) 12/16/2017  . Hepatic encephalopathy ([place]) 10/18/2017  . Hypokalemia 10/09/2017  . Hyperglycemia 10/09/2017  . Symptomatic anemia 06/26/2017  . Sepsis ([place]) 06/12/2017  . Type 2 diabetes mellitus with diabetic nephropathy, with long-term current use of insulin ([place]) 05/29/2017  . Cellulitis in diabetic foot ([place]) 02/21/2017  . Cellulitis 02/21/2017  . GI bleed 07/20/2015  . Hematemesis 02/08/2015  . Cirrhosis ([place]) 02/08/2015  . Esophageal varices ([place]) 02/08/2015  . Gastroesophageal reflux disease 02/08/2015  . Hyperlipidemia 02/08/2015  . Chronic venous stasis dermatitis of both lower extremities 10/06/2014  . Edema 05/25/2014  . Hypertension  05/25/2014  . Obesity 05/25/2014  . Sleep apnea 05/25/2014  . Barrett's esophagus 05/25/2014  . Pancytopenia ([place]) 05/25/2014  . Iron deficiency anemia 12/04/2013  . Thrombocytopenia ([place]) 12/04/2013  . Splenomegaly 11/04/2013     Past Surgical History:  Procedure Laterality Date  . ESOPHAGOGASTRODUODENOSCOPY N/A 02/09/2015   Procedure: ESOPHAGOGASTRODUODENOSCOPY (EGD);  Surgeon: Manya Silvas, MD;  Location: [facility] ENDOSCOPY;  Service: Endoscopy;  Laterality: N/A;  .  ESOPHAGOGASTRODUODENOSCOPY N/A 07/22/2015   Procedure: ESOPHAGOGASTRODUODENOSCOPY (EGD);  Surgeon: Manya Silvas, MD;  Location: [facility] ENDOSCOPY;  Service: Endoscopy;  Laterality: N/A;  . ESOPHAGOGASTRODUODENOSCOPY N/A 06/26/2017   Procedure: ESOPHAGOGASTRODUODENOSCOPY (EGD);  Surgeon: Manya Silvas, MD;  Location: [facility] ENDOSCOPY;  Service: Endoscopy;  Laterality: N/A;  . ESOPHAGOGASTRODUODENOSCOPY N/A 06/27/2017   Procedure: ESOPHAGOGASTRODUODENOSCOPY (EGD);  Surgeon: Manya Silvas, MD;  Location: [facility] ENDOSCOPY;  Service: Endoscopy;  Laterality: N/A;  . ESOPHAGOGASTRODUODENOSCOPY N/A 06/28/2017   Procedure: ESOPHAGOGASTRODUODENOSCOPY (EGD);  Surgeon: Manya Silvas, MD;  Location: [facility] ENDOSCOPY;  Service: Endoscopy;  Laterality: N/A;  . ESOPHAGOGASTRODUODENOSCOPY Bilateral 10/11/2017   Procedure: ESOPHAGOGASTRODUODENOSCOPY (EGD);  Surgeon: Manya Silvas, MD;  Location: [facility] ENDOSCOPY;  Service: Endoscopy;  Laterality: Bilateral;  . ESOPHAGOGASTRODUODENOSCOPY N/A 12/04/2017   Procedure: ESOPHAGOGASTRODUODENOSCOPY (EGD);  Surgeon: Manya Silvas, MD;  Location: [facility] ENDOSCOPY;  Service: Endoscopy;  Laterality: N/A;  . ESOPHAGOGASTRODUODENOSCOPY (EGD) WITH PROPOFOL N/A 07/20/2015   Procedure: ESOPHAGOGASTRODUODENOSCOPY (EGD) WITH PROPOFOL;  Surgeon: Manya Silvas, MD;  Location: [facility] ENDOSCOPY;  Service: Endoscopy;  Laterality: N/A;  . ESOPHAGOGASTRODUODENOSCOPY (EGD) WITH PROPOFOL N/A 09/16/2015   Procedure: ESOPHAGOGASTRODUODENOSCOPY (EGD) WITH PROPOFOL;  Surgeon: Manya Silvas, MD;  Location: [facility] ENDOSCOPY;  Service: Endoscopy;  Laterality: N/A;  . ESOPHAGOGASTRODUODENOSCOPY (EGD) WITH PROPOFOL N/A 03/16/2016   Procedure: ESOPHAGOGASTRODUODENOSCOPY (EGD) WITH PROPOFOL;  Surgeon: Manya Silvas, MD;  Location: [facility] ENDOSCOPY;  Service: Endoscopy;  Laterality: N/A;  . ESOPHAGOGASTRODUODENOSCOPY (EGD) WITH PROPOFOL N/A 09/14/2016   Procedure: ESOPHAGOGASTRODUODENOSCOPY (EGD)  WITH PROPOFOL;  Surgeon: Manya Silvas, MD;  Location: [facility] ENDOSCOPY;  Service: Endoscopy;  Laterality: N/A;  . ESOPHAGOGASTRODUODENOSCOPY (EGD) WITH PROPOFOL N/A 11/05/2016   Procedure: ESOPHAGOGASTRODUODENOSCOPY (EGD) WITH PROPOFOL;  Surgeon: Manya Silvas, MD;  Location: [facility] ENDOSCOPY;  Service: Endoscopy;  Laterality: N/A;  . ESOPHAGOGASTRODUODENOSCOPY (EGD) WITH PROPOFOL N/A 02/06/2017   Procedure: ESOPHAGOGASTRODUODENOSCOPY (EGD) WITH PROPOFOL;  Surgeon: Manya Silvas, MD;  Location: [facility] ENDOSCOPY;  Service: Endoscopy;  Laterality: N/A;  . ESOPHAGOGASTRODUODENOSCOPY (EGD) WITH PROPOFOL N/A 04/24/2017   Procedure: ESOPHAGOGASTRODUODENOSCOPY (EGD) WITH PROPOFOL;  Surgeon: Manya Silvas, MD;  Location: [facility] ENDOSCOPY;  Service: Endoscopy;  Laterality: N/A;  . ESOPHAGOGASTRODUODENOSCOPY (EGD) WITH PROPOFOL N/A 07/31/2017   Procedure: ESOPHAGOGASTRODUODENOSCOPY (EGD) WITH PROPOFOL;  Surgeon: Manya Silvas, MD;  Location: [facility] ENDOSCOPY;  Service: Endoscopy;  Laterality: N/A;  . ESOPHAGOGASTRODUODENOSCOPY (EGD) WITH PROPOFOL N/A 08/08/2018   Procedure: ESOPHAGOGASTRODUODENOSCOPY (EGD) WITH PROPOFOL;  Surgeon: Manya Silvas, MD;  Location: [facility] ENDOSCOPY;  Service: Endoscopy;  Laterality: N/A;  . GIVENS CAPSULE STUDY N/A 12/05/2017   Procedure: GIVENS CAPSULE STUDY;  Surgeon: Manya Silvas, MD;  Location: [facility] ENDOSCOPY;  Service: Endoscopy;  Laterality: N/A;  . TONSILLECTOMY    . TONSILLECTOMY AND ADENOIDECTOMY    . ULNAR NERVE TRANSPOSITION    . UVULOPALATOPHARYNGOPLASTY       Prior to Admission medications   Medication Sig Start Date End Date Taking? Authorizing Provider  atorvastatin (LIPITOR) 40 MG tablet Take 1 tablet (40 mg total) by mouth daily at 6 PM. 02/05/19  Nicholes Mango, MD  cetirizine (ZYRTEC) 10 MG tablet Take 10 mg by mouth daily.    [provider]  dextromethorphan (DELSYM) 30 MG/5ML liquid Take 30 mg by mouth 2 (two) times daily.    [provider]  famotidine (PEPCID) 20 MG tablet Take 20 mg by mouth 2 (two) times daily. 01/13/19   [provider]  ferrous sulfate 325 (65 FE) MG tablet Take 325 mg by mouth 2 (two) times daily with a meal.     [provider]  furosemide (LASIX) 40 MG tablet Take 40 mg by mouth 2 (two) times a day.  01/13/19   [provider]  insulin aspart protamine - aspart (NOVOLOG 70/30 MIX) (70-30) 100 UNIT/ML FlexPen Inject 70-84 Units into the skin See admin instructions. Inject 84u under the skin every morning and 70u under the skin every evening    [provider]  lactulose (CHRONULAC) 10 GM/15ML solution Take 30 g by mouth 4 (four) times daily.  12/11/18   [provider]  loperamide (IMODIUM) 2 MG capsule Take 2 mg by mouth 3 (three) times daily.    [provider]  LORazepam (ATIVAN) 0.5 MG tablet Take 1 tablet (0.5 mg total) by mouth every 8 (eight) hours as needed for anxiety. 10/18/18   Gladstone Lighter, MD  metolazone (ZAROXOLYN) 5 MG tablet Take 5 mg by mouth daily.    [provider]  Multiple Vitamin (MULTIVITAMIN) tablet Take 1 tablet by mouth daily.    [provider]  omeprazole (PRILOSEC) 40 MG capsule Take 40 mg by mouth daily.    [provider]  Potassium Chloride ER 20 MEQ TBCR Take 20 mEq by mouth 2 (two) times daily. While on lasix' 10/18/18   Gladstone Lighter, MD  rifaximin (XIFAXAN) 550 MG TABS tablet Take 1 tablet (550 mg total) by mouth 2 (two) times daily. Patient not taking: Reported on 02/03/2019 01/01/18   Loletha Grayer, MD  traMADol (ULTRAM) 50 MG tablet Take 50 mg by mouth 3 (three) times daily as needed for moderate pain.  12/24/18   [provider]  vitamin C (ASCORBIC ACID) 250 MG tablet Take 500 mg by mouth 2 (two) times daily.    [provider]  zinc sulfate 220 (50 Zn) MG capsule Take 220 mg by mouth daily.    [provider]     Allergies Patient has no  known allergies.   Family History  Problem Relation Age of Onset  . Diabetes Other   . Transient ischemic attack Father   . CAD Father     Social History Social History   Tobacco Use  . Smoking status: Current Some Day Smoker    Years: 20.00    Types: Cigars  . Smokeless tobacco: Never Used  Substance Use Topics  . Alcohol use: No    Comment: stopped 15 years ago  . Drug use: No    Review of Systems Level 5 Caveat: Portions of the History and Physical including HPI and review of systems are unable to be completely obtained due to patient being a poor historian   Constitutional:   No known fever.  ENT:   No rhinorrhea. Cardiovascular:   No chest pain or syncope. Respiratory:   No dyspnea or cough. Gastrointestinal:   Negative for abdominal pain, vomiting and diarrhea.  Musculoskeletal:   Negative for focal pain or swelling ____________________________________________   PHYSICAL EXAM:  VITAL SIGNS: ED Triage Vitals  Enc Vitals Group  BP 03/05/19 1200 (!) 168/58     Pulse Rate 03/05/19 1038 77     Resp 03/05/19 1038 16     Temp --      Temp src --      SpO2 03/05/19 1038 99 %     Weight --      Height --      Head Circumference --      Peak Flow --      Pain Score 03/05/19 1034 0     Pain Loc --      Pain Edu? --      Excl. in [place]? --     Vital signs reviewed, nursing assessments reviewed.   Constitutional:   Alert and oriented to self.  Ill-appearing Eyes:   Conjunctivae are normal. EOMI. PERRL. ENT      Head:   Normocephalic and atraumatic.      Nose:   No congestion/rhinnorhea.       Mouth/Throat:   MMM, no pharyngeal erythema. No peritonsillar mass.       Neck:   No meningismus. Full ROM. Hematological/Lymphatic/Immunilogical:   No cervical lymphadenopathy. Cardiovascular:   RRR. Symmetric bilateral radial and DP pulses.  No murmurs. Cap refill less than 2 seconds. Respiratory:   Normal respiratory effort without tachypnea/retractions. Breath  sounds are clear and equal bilaterally. No wheezes/rales/rhonchi. Gastrointestinal:   Soft and nontender. Non distended. There is no CVA tenderness.  No rebound, rigidity, or guarding.  Rectal exam shows brown stool, Hemoccult positive Genitourinary:   Normal Musculoskeletal:   Normal range of motion in all extremities. No joint effusions.  No lower extremity tenderness.  No edema. Neurologic:   Normal speech and language.  Motor grossly intact. No acute focal neurologic deficits are appreciated.  Skin:    Skin is warm, dry and intact. No rash noted.  No petechiae, purpura, or bullae.  ____________________________________________    LABS (pertinent positives/negatives) (all labs ordered are listed, but only abnormal results are displayed) Labs Reviewed  AMMONIA - Abnormal; Notable for the following components:      Result Value   Ammonia 105 (*)    All other components within normal limits  COMPREHENSIVE METABOLIC PANEL - Abnormal; Notable for the following components:   Potassium 3.2 (*)    Glucose, Bld 129 (*)    BUN 35 (*)    Creatinine, Ser 2.26 (*)    Albumin 2.9 (*)    GFR calc non Af Amer 29 (*)    GFR calc Af Amer 34 (*)    All other components within normal limits  URINE CULTURE  SARS CORONAVIRUS 2 (HOSPITAL ORDER, [place] [facility])  LACTIC ACID, PLASMA  PROTIME-INR  APTT  CBC WITH DIFFERENTIAL/PLATELET  URINALYSIS, COMPLETE (UACMP) WITH MICROSCOPIC   ____________________________________________   EKG  Interpreted by me Normal sinus rhythm rate of 70, normal axis.  First-degree AV block.  Normal QRS and ST segment.  ____________________________________________    RADIOLOGY  No results found.  ____________________________________________   PROCEDURES Procedures  ____________________________________________  DIFFERENTIAL DIAGNOSIS   Dehydration, electrolyte abnormality, hepatic encephalopathy, acute anemia  CLINICAL  IMPRESSION / ASSESSMENT AND PLAN / ED COURSE  Pertinent labs & imaging results that were available during my care of the patient were reviewed by me and considered in my medical decision making (see chart for details).   Corey West was evaluated in Emergency Department on 03/05/2019 for the symptoms described in the history of present illness. He was  evaluated in the context of the global COVID-19 pandemic, which necessitated consideration that the patient might be at risk for infection with the SARS-CoV-2 virus that causes COVID-19. Institutional protocols and algorithms that pertain to the evaluation of patients at risk for COVID-19 are in a state of rapid change based on information released by regulatory bodies including the CDC and federal and state organizations. These policies and algorithms were followed during the patient's care in the ED.   Patient presents with confusion, generalized weakness.  Orthostatic symptoms.  Chronically ill-appearing, nontoxic on assessment.  Not septic.  Vital signs unremarkable.  Labs reveal baseline CKD, persistent anemia.  No signs of shock at present.  He does have Hemoccult positive brown stool for which I will give IV Protonix given his history of Barrett's esophagus and esophageal varices.  Lactulose ordered for ammonia level of 105.  Discussed with hospitalist.      ____________________________________________   FINAL CLINICAL IMPRESSION(S) / ED DIAGNOSES    Final diagnoses:  Hepatic cirrhosis, unspecified hepatic cirrhosis type, unspecified whether ascites present (HCC)  Hepatic encephalopathy (HCC)  Stage 3 chronic kidney disease ([place])  Occult GI bleeding  Confusion  Chronic anemia   ED Discharge Orders    None      Portions of this note were generated with dragon dictation software. Dictation errors may occur despite best attempts at proofreading.   Carrie Mew, MD 03/05/19 1327

## 2019-03-06 ENCOUNTER — Encounter
Admission: EM | Disposition: A | Discharge status: Home / Self Care | Payer: Self-pay | Source: Home / Self Care | Attending: Internal Medicine

## 2019-03-06 ENCOUNTER — Inpatient Hospital Stay: Payer: Medicare Other | Attending: Oncology

## 2019-03-06 ENCOUNTER — Encounter: Payer: Self-pay | Admitting: *Deleted

## 2019-03-06 ENCOUNTER — Inpatient Hospital Stay: Payer: Medicare Other | Admitting: Certified Registered Nurse Anesthetist

## 2019-03-06 ENCOUNTER — Inpatient Hospital Stay: Payer: Medicare Other

## 2019-03-06 DIAGNOSIS — K7290 Hepatic failure, unspecified without coma: Secondary | ICD-10-CM | POA: Diagnosis not present

## 2019-03-06 HISTORY — PX: ESOPHAGOGASTRODUODENOSCOPY: SHX5428

## 2019-03-06 LAB — BASIC METABOLIC PANEL
Anion gap: 8 (ref 5–15)
BUN: 33 mg/dL — ABNORMAL HIGH (ref 8–23)
CO2: 29 mmol/L (ref 22–32)
Calcium: 9.3 mg/dL (ref 8.9–10.3)
Chloride: 106 mmol/L (ref 98–111)
Creatinine, Ser: 2.14 mg/dL — ABNORMAL HIGH (ref 0.61–1.24)
GFR calc Af Amer: 36 mL/min — ABNORMAL LOW (ref >60)
GFR calc non Af Amer: 31 mL/min — ABNORMAL LOW (ref >60)
Glucose, Bld: 139 mg/dL — ABNORMAL HIGH (ref 70–99)
Potassium: 3.5 mmol/L (ref 3.5–5.1)
Sodium: 143 mmol/L (ref 135–145)

## 2019-03-06 LAB — URINE CULTURE: Culture: 20000 — AB

## 2019-03-06 LAB — CBC
HCT: 25.1 % — ABNORMAL LOW (ref 39.0–52.0)
Hemoglobin: 8 g/dL — ABNORMAL LOW (ref 13.0–17.0)
MCH: 30 pg (ref 26.0–34.0)
MCHC: 31.9 g/dL (ref 30.0–36.0)
MCV: 94 fL (ref 80.0–100.0)
Platelets: 109 10*3/uL — ABNORMAL LOW (ref 150–400)
RBC: 2.67 MIL/uL — ABNORMAL LOW (ref 4.22–5.81)
RDW: 14.9 % (ref 11.5–15.5)
WBC: 4.1 10*3/uL (ref 4.0–10.5)
nRBC: 0 % (ref 0.0–0.2)

## 2019-03-06 LAB — GLUCOSE, CAPILLARY
Glucose-Capillary: 129 mg/dL — ABNORMAL HIGH (ref 70–99)
Glucose-Capillary: 164 mg/dL — ABNORMAL HIGH (ref 70–99)

## 2019-03-06 LAB — AMMONIA: Ammonia: 24 umol/L (ref 9–35)

## 2019-03-06 SURGERY — EGD (ESOPHAGOGASTRODUODENOSCOPY)
Anesthesia: General

## 2019-03-06 MED ORDER — GLYCOPYRROLATE 0.2 MG/ML IJ SOLN: INTRAMUSCULAR | Status: DC | PRN

## 2019-03-06 MED ORDER — RIFAXIMIN 550 MG PO TABS: 550.0000 mg | ORAL_TABLET | Freq: Two times a day (BID) | ORAL | 0 refills | Status: DC

## 2019-03-06 MED ORDER — LIDOCAINE HCL (CARDIAC) PF 100 MG/5ML IV SOSY
PREFILLED_SYRINGE | INTRAVENOUS | Status: DC | PRN
  Administered 2019-03-06: 100 mg via INTRATRACHEAL

## 2019-03-06 MED ORDER — SODIUM CHLORIDE 0.9 % IV SOLN
INTRAVENOUS | Status: DC
  Administered 2019-03-06: 1000 mL via INTRAVENOUS

## 2019-03-06 MED ORDER — GLYCOPYRROLATE 0.2 MG/ML IJ SOLN
INTRAMUSCULAR | Status: DC | PRN
  Administered 2019-03-06: 0.2 mg via INTRAVENOUS

## 2019-03-06 MED ORDER — PROPOFOL 10 MG/ML IV BOLUS
INTRAVENOUS | Status: DC | PRN
  Administered 2019-03-06 (×4): 50 mg via INTRAVENOUS

## 2019-03-06 MED ORDER — PANTOPRAZOLE SODIUM 40 MG PO TBEC: 40.0000 mg | DELAYED_RELEASE_TABLET | Freq: Two times a day (BID) | ORAL | 11 refills | Status: DC

## 2019-03-06 NOTE — Consult Note (Signed)
[place] Nurse wound consult note Reason for Consult:Patient is known to our service. Currently there are no open areas to LEs.  He has a HHRN and wears lymphedema pumps at home to maintain his LEs.  He is not amenable to a leg wrap today or while in house and tells me he is anticipating going home either tonight or tomorrow and that he will resume his routine at that time. He says that his legs get "mesed up" when he comes to the hospital. Wound type: venous insufficiency/lymphedema Pressure Injury POA: NA Recommendations:  If patient is in house tomorrow, recommend dry boot application: wash and dry, wrap from toes to knee with Kerlix roll gauze and top with ACE bandage wrapped in a similar manner. Change daily. If you agree, please order/ask Bedside RN to perform.  [place] nursing team will not follow, but will remain available to this patient, the nursing and medical teams.  Please re-consult if needed. Thanks, Maudie Flakes, MSN, RN, [place], Arther Abbott  Pager# [phone]

## 2019-03-06 NOTE — Transfer of Care (Signed)
Immediate Anesthesia Transfer of Care Note  Patient: Corey West  Procedure(s) Performed: ESOPHAGOGASTRODUODENOSCOPY (EGD) (N/A )  Patient Location: Endoscopy Unit  Anesthesia Type:General  Level of Consciousness: drowsy, patient cooperative and responds to stimulation  Airway & Oxygen Therapy: Patient Spontanous Breathing and Patient connected to face mask oxygen  Post-op Assessment: Report given to RN and Post -op Vital signs reviewed and stable  Post vital signs: Reviewed and stable  Last Vitals:  Vitals Value Taken Time  BP 136/50 03/06/2019  1:23 PM  Temp 36.6 C 03/06/2019  1:23 PM  Pulse 74 03/06/2019  1:25 PM  Resp 17 03/06/2019  1:25 PM  SpO2 100 % 03/06/2019  1:25 PM  Vitals shown include unvalidated device data.  Last Pain:  Vitals:   03/06/19 1323  TempSrc: Tympanic  PainSc: Asleep      Patients Stated Pain Goal: 0 (02/58/52 7782)  Complications: No apparent anesthesia complications

## 2019-03-06 NOTE — Consult Note (Signed)
[facility] GI Inpatient Consult Note   Kathline Magic, M.D.  Reason for Consult: Encephalopathy, GI bleed, anemia, Cirrhosis.   Attending Requesting Consult: Dustin Flock, M.D.  Outpatient Primary Physician: [name], M.D.  History of Present Illness: Corey West is a 68 y.o. male with a history of otogenic cirrhosis presents with confusion and admitted yesterday evening for encephalopathy.  Patient also has had progressive anemia requiring a blood transfusion a couple of weeks ago.  Patient has dark stools but he takes iron supplements as well.  He denies any hematemesis or coffee-ground emesis, nausea or vomiting of any kind.  He had some mild confusion but feels more alert today after given some lactulose.  His memory is now optimal for recent and remote medical events although he is alert to place and person. The patient's last reported EGD with esophageal variceal banding occurred approximately 1 year ago at [facility].  He has had an endoscopy in the interim by Dr.Elliott without banding.  Past Medical History:  Past Medical History:  Diagnosis Date  . Anemia   . Anxiety    controlled;   . Arthritis   . AVM (arteriovenous malformation) of stomach, acquired with hemorrhage   . Barrett's esophagus   . Chronic kidney disease    renal infufficiency  . Cirrhosis ([place])   . Depression    controlled;   Marland Kitchen Diabetes mellitus without complication ([place])    not controlled, taking insulin but sugar continues to run high;   . Edema   . Esophageal varices ([place])   . GAVE (gastric antral vascular ectasia)   . GERD (gastroesophageal reflux disease)   . History of hiatal hernia   . Hyperlipidemia   . Hypertension    controlled well;   . Liver cirrhosis ([place])   . Nephropathy, diabetic ([place])   . Obesity   . Pancytopenia ([place])   . Polyp, stomach    with chronic blood loss  . Sleep apnea    does not wear a cpap, Medicare would not pay for it   . Venous stasis  dermatitis of both lower extremities   . Venous stasis of both lower extremities    with cellulitis    Problem List: Patient Active Problem List   Diagnosis Date Noted  . HCAP (healthcare-associated pneumonia) 02/03/2019  . Aphasia 01/14/2019  . Acute on chronic renal failure ([place]) 01/14/2019  . Malnutrition of moderate degree 12/03/2018  . C. difficile diarrhea 12/02/2018  . Ankle fracture, left, closed, initial encounter 10/15/2018  . Ankle fracture, right, closed, initial encounter 10/15/2018  . Ankle fracture 10/15/2018  . Pressure injury of skin 07/19/2018  . UTI (urinary tract infection) 07/18/2018  . CKD (chronic kidney disease) stage 3, GFR 30-59 ml/min (HCC) 01/02/2018  . 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 Goals of care, counseling/discussion   . Palliative care encounter   . Hepatic encephalopathy syndrome ([place]) 12/16/2017  . Hepatic encephalopathy ([place]) 10/18/2017  . Hypokalemia 10/09/2017  . Hyperglycemia 10/09/2017  . Symptomatic anemia 06/26/2017  . Sepsis ([place]) 06/12/2017  . Type 2 diabetes mellitus with diabetic nephropathy, with long-term current use of insulin ([place]) 05/29/2017  . Cellulitis in diabetic foot ([place]) 02/21/2017  . Cellulitis 02/21/2017  . GI bleed 07/20/2015  . Hematemesis 02/08/2015  . Cirrhosis ([place]) 02/08/2015  . Esophageal varices ([place]) 02/08/2015  . Gastroesophageal reflux disease 02/08/2015  . Hyperlipidemia 02/08/2015  . Chronic venous stasis dermatitis of both lower extremities 10/06/2014  . Edema 05/25/2014  .  Hypertension 05/25/2014  . Obesity 05/25/2014  . Sleep apnea 05/25/2014  . Barrett's esophagus 05/25/2014  . Pancytopenia ([place]) 05/25/2014  . Iron deficiency anemia 12/04/2013  . Thrombocytopenia ([place]) 12/04/2013  . Splenomegaly 11/04/2013    Past Surgical History: Past Surgical History:  Procedure Laterality Date  . ESOPHAGOGASTRODUODENOSCOPY N/A 02/09/2015   Procedure: ESOPHAGOGASTRODUODENOSCOPY (EGD);   Surgeon: Manya Silvas, MD;  Location: [facility] ENDOSCOPY;  Service: Endoscopy;  Laterality: N/A;  . ESOPHAGOGASTRODUODENOSCOPY N/A 07/22/2015   Procedure: ESOPHAGOGASTRODUODENOSCOPY (EGD);  Surgeon: Manya Silvas, MD;  Location: [facility] ENDOSCOPY;  Service: Endoscopy;  Laterality: N/A;  . ESOPHAGOGASTRODUODENOSCOPY N/A 06/26/2017   Procedure: ESOPHAGOGASTRODUODENOSCOPY (EGD);  Surgeon: Manya Silvas, MD;  Location: [facility] ENDOSCOPY;  Service: Endoscopy;  Laterality: N/A;  . ESOPHAGOGASTRODUODENOSCOPY N/A 06/27/2017   Procedure: ESOPHAGOGASTRODUODENOSCOPY (EGD);  Surgeon: Manya Silvas, MD;  Location: [facility] ENDOSCOPY;  Service: Endoscopy;  Laterality: N/A;  . ESOPHAGOGASTRODUODENOSCOPY N/A 06/28/2017   Procedure: ESOPHAGOGASTRODUODENOSCOPY (EGD);  Surgeon: Manya Silvas, MD;  Location: [facility] ENDOSCOPY;  Service: Endoscopy;  Laterality: N/A;  . ESOPHAGOGASTRODUODENOSCOPY Bilateral 10/11/2017   Procedure: ESOPHAGOGASTRODUODENOSCOPY (EGD);  Surgeon: Manya Silvas, MD;  Location: [facility] ENDOSCOPY;  Service: Endoscopy;  Laterality: Bilateral;  . ESOPHAGOGASTRODUODENOSCOPY N/A 12/04/2017   Procedure: ESOPHAGOGASTRODUODENOSCOPY (EGD);  Surgeon: Manya Silvas, MD;  Location: [facility] ENDOSCOPY;  Service: Endoscopy;  Laterality: N/A;  . ESOPHAGOGASTRODUODENOSCOPY (EGD) WITH PROPOFOL N/A 07/20/2015   Procedure: ESOPHAGOGASTRODUODENOSCOPY (EGD) WITH PROPOFOL;  Surgeon: Manya Silvas, MD;  Location: [facility] ENDOSCOPY;  Service: Endoscopy;  Laterality: N/A;  . ESOPHAGOGASTRODUODENOSCOPY (EGD) WITH PROPOFOL N/A 09/16/2015   Procedure: ESOPHAGOGASTRODUODENOSCOPY (EGD) WITH PROPOFOL;  Surgeon: Manya Silvas, MD;  Location: [facility] ENDOSCOPY;  Service: Endoscopy;  Laterality: N/A;  . ESOPHAGOGASTRODUODENOSCOPY (EGD) WITH PROPOFOL N/A 03/16/2016   Procedure: ESOPHAGOGASTRODUODENOSCOPY (EGD) WITH PROPOFOL;  Surgeon: Manya Silvas, MD;  Location: [facility] ENDOSCOPY;  Service: Endoscopy;  Laterality: N/A;  .  ESOPHAGOGASTRODUODENOSCOPY (EGD) WITH PROPOFOL N/A 09/14/2016   Procedure: ESOPHAGOGASTRODUODENOSCOPY (EGD) WITH PROPOFOL;  Surgeon: Manya Silvas, MD;  Location: [facility] ENDOSCOPY;  Service: Endoscopy;  Laterality: N/A;  . ESOPHAGOGASTRODUODENOSCOPY (EGD) WITH PROPOFOL N/A 11/05/2016   Procedure: ESOPHAGOGASTRODUODENOSCOPY (EGD) WITH PROPOFOL;  Surgeon: Manya Silvas, MD;  Location: [facility] ENDOSCOPY;  Service: Endoscopy;  Laterality: N/A;  . ESOPHAGOGASTRODUODENOSCOPY (EGD) WITH PROPOFOL N/A 02/06/2017   Procedure: ESOPHAGOGASTRODUODENOSCOPY (EGD) WITH PROPOFOL;  Surgeon: Manya Silvas, MD;  Location: [facility] ENDOSCOPY;  Service: Endoscopy;  Laterality: N/A;  . ESOPHAGOGASTRODUODENOSCOPY (EGD) WITH PROPOFOL N/A 04/24/2017   Procedure: ESOPHAGOGASTRODUODENOSCOPY (EGD) WITH PROPOFOL;  Surgeon: Manya Silvas, MD;  Location: [facility] ENDOSCOPY;  Service: Endoscopy;  Laterality: N/A;  . ESOPHAGOGASTRODUODENOSCOPY (EGD) WITH PROPOFOL N/A 07/31/2017   Procedure: ESOPHAGOGASTRODUODENOSCOPY (EGD) WITH PROPOFOL;  Surgeon: Manya Silvas, MD;  Location: [facility] ENDOSCOPY;  Service: Endoscopy;  Laterality: N/A;  . ESOPHAGOGASTRODUODENOSCOPY (EGD) WITH PROPOFOL N/A 08/08/2018   Procedure: ESOPHAGOGASTRODUODENOSCOPY (EGD) WITH PROPOFOL;  Surgeon: Manya Silvas, MD;  Location: [facility] ENDOSCOPY;  Service: Endoscopy;  Laterality: N/A;  . GIVENS CAPSULE STUDY N/A 12/05/2017   Procedure: GIVENS CAPSULE STUDY;  Surgeon: Manya Silvas, MD;  Location: [facility] ENDOSCOPY;  Service: Endoscopy;  Laterality: N/A;  . TONSILLECTOMY    . TONSILLECTOMY AND ADENOIDECTOMY    . ULNAR NERVE TRANSPOSITION    . UVULOPALATOPHARYNGOPLASTY      Allergies: No Known Allergies  Home Medications: Medications Prior to Admission  Medication Sig Dispense Refill Last Dose  . atorvastatin (LIPITOR) 40 MG tablet Take 1 tablet (40 mg total) by  mouth daily at 6 PM. 30 tablet 0 unknown at unknown  . cetirizine (ZYRTEC) 10 MG tablet Take 10 mg by  mouth daily.   unknown at unknown  . famotidine (PEPCID) 20 MG tablet Take 20 mg by mouth 2 (two) times daily.   unknown at unknown  . ferrous sulfate 325 (65 FE) MG tablet Take 325 mg by mouth 2 (two) times daily with a meal.    unknown at unknown  . furosemide (LASIX) 40 MG tablet Take 40 mg by mouth 2 (two) times a day.    unknown at unknown  . insulin aspart protamine - aspart (NOVOLOG 70/30 MIX) (70-30) 100 UNIT/ML FlexPen Inject 70-84 Units into the skin See admin instructions. Inject 84u under the skin every morning and 70u under the skin every evening   unknown at unknown  . lactulose (CHRONULAC) 10 GM/15ML solution Take 30 g by mouth 4 (four) times daily.    unknown at unknown  . LORazepam (ATIVAN) 0.5 MG tablet Take 1 tablet (0.5 mg total) by mouth every 8 (eight) hours as needed for anxiety. 20 tablet 0 prn at prn  . metolazone (ZAROXOLYN) 5 MG tablet Take 5 mg by mouth daily.   unknown at unknown  . Multiple Vitamin (MULTIVITAMIN) tablet Take 1 tablet by mouth daily.   unknown at unknown  . omeprazole (PRILOSEC) 40 MG capsule Take 40 mg by mouth daily.   unknown at unknown  . Potassium Chloride ER 20 MEQ TBCR Take 20 mEq by mouth 2 (two) times daily. While on lasix' (Patient taking differently: Take 20 mEq by mouth 2 (two) times daily. ) 30 tablet 1 unknown at unknown  . traMADol (ULTRAM) 50 MG tablet Take 50 mg by mouth 3 (three) times daily.    unknown at unknown  . vitamin C (ASCORBIC ACID) 250 MG tablet Take 500 mg by mouth 2 (two) times daily.   unknown at unknown  . zinc sulfate 220 (50 Zn) MG capsule Take 220 mg by mouth daily.   unknown at unknown   Home medication reconciliation was completed with the patient.   Scheduled Inpatient Medications:   . atorvastatin  40 mg Oral q1800  . dextromethorphan  30 mg Oral BID  . famotidine  20 mg Oral BID  . ferrous sulfate  325 mg Oral BID WC  . furosemide  40 mg Oral BID  . insulin aspart  0-5 Units Subcutaneous QHS  . insulin  aspart  0-9 Units Subcutaneous TID WC  . insulin aspart protamine- aspart  70 Units Subcutaneous BID WC  . lactulose  30 g Oral QID  . loperamide  2 mg Oral TID  . loratadine  10 mg Oral Daily  . metolazone  5 mg Oral Daily  . multivitamin with minerals  1 tablet Oral Daily  . pantoprazole (PROTONIX) IV  40 mg Intravenous Q12H  . potassium chloride SA  20 mEq Oral BID  . rifaximin  550 mg Oral BID  . vitamin C  500 mg Oral BID  . zinc sulfate  220 mg Oral Daily    Continuous Inpatient Infusions:    PRN Inpatient Medications:  acetaminophen **OR** acetaminophen, LORazepam, ondansetron **OR** ondansetron (ZOFRAN) IV, traMADol  Family History: family history includes CAD in his father; Diabetes in an other family member; Transient ischemic attack in his father.   GI Family History: Negative  Social History:   reports that he has been smoking cigars. He has smoked for the past 20.00 years.  He has never used smokeless tobacco. He reports that he does not drink alcohol or use drugs. The patient denies ETOH, tobacco, or drug use.    Review of Systems: Review of Systems - Negative except History of present illness  Physical Examination: BP (!) 151/58 (BP Location: Left Arm)   Pulse 76   Temp (!) 97.3 F (36.3 C) (Oral)   Resp 16   SpO2 97%  Physical Exam Constitutional:      Appearance: He is obese. He is not ill-appearing or diaphoretic.  HENT:     Head: Normocephalic.     Nose: Nose normal.  Eyes:     Conjunctiva/sclera: Conjunctivae normal.     Pupils: Pupils are equal, round, and reactive to light.  Cardiovascular:     Rate and Rhythm: Normal rate.     Heart sounds: No murmur. No gallop.   Pulmonary:     Effort: Pulmonary effort is normal.     Breath sounds: Normal breath sounds.  Abdominal:     General: There is distension.     Palpations: Abdomen is soft. There is no mass.     Tenderness: There is no abdominal tenderness. There is no guarding or rebound.      Hernia: No hernia is present.  Musculoskeletal:        General: Swelling present.     Right lower leg: Edema present.     Left lower leg: Edema present.  Skin:    General: Skin is warm and dry.     Coloration: Skin is not jaundiced.  Neurological:     Mental Status: He is alert. Mental status is at baseline.  Psychiatric:        Mood and Affect: Mood normal.     Data: Lab Results  Component Value Date   WBC 4.1 03/06/2019   HGB 8.0 (L) 03/06/2019   HCT 25.1 (L) 03/06/2019   MCV 94.0 03/06/2019   PLT 109 (L) 03/06/2019   Recent Labs  Lab 03/05/19 1050 03/06/19 0502  HGB 8.3* 8.0*   Lab Results  Component Value Date   NA 143 03/06/2019   K 3.5 03/06/2019   CL 106 03/06/2019   CO2 29 03/06/2019   BUN 33 (H) 03/06/2019   CREATININE 2.14 (H) 03/06/2019   Lab Results  Component Value Date   ALT 25 03/05/2019   AST 30 03/05/2019   ALKPHOS 75 03/05/2019   BILITOT 0.8 03/05/2019   Recent Labs  Lab 03/05/19 1050  APTT 28  INR 1.2   CBC Latest Ref Rng & Units 03/06/2019 03/05/2019 02/24/2019  WBC 4.0 - 10.5 K/uL 4.1 4.7 -  Hemoglobin 13.0 - 17.0 g/dL 8.0(L) 8.3(L) 8.1(L)  Hematocrit 39.0 - 52.0 % 25.1(L) 25.1(L) -  Platelets 150 - 400 K/uL 109(L) 115(L) -    STUDIES: No results found. @IMAGES @  Assessment: 1.  Questionable melena with anemia secondary to blood loss- currently stable after blood transfusion but is happening at some interval that needs to be investigated.  2.  Hepatic encephalopathy-improved with lactulose.  3.  Obesity.  4.  History of gave.  5 diabetes mellitus  COVID-19 status:     Tested negative     Recommendations:  1.  Proceed with EGD.The patient understands the nature of the planned procedure, indications, risks, alternatives and potential complications including but not limited to bleeding, infection, perforation, damage to internal organs and possible oversedation/side effects from anesthesia. The patient agrees and gives  consent to proceed.  Please  refer to procedure notes for findings, recommendations and patient disposition/instructions.  Thank you for the consult. Please call with questions or concerns.  Olean Ree, "[name] Hurst MD [facility] [place], [place] [address] [phone]  03/06/2019 8:47 AM

## 2019-03-06 NOTE — Progress Notes (Signed)
Discharge instructions explained to pt/ verbalized an understanding/ iv removed/ RX given to pt/ transported off unit via wheelchair. 

## 2019-03-06 NOTE — Anesthesia Preprocedure Evaluation (Signed)
Anesthesia Evaluation  Patient identified by MRN, date of birth, ID band Patient awake    Reviewed: Allergy & Precautions, NPO status , Patient's Chart, lab work & pertinent test results  History of Anesthesia Complications Negative for: history of anesthetic complications  Airway Mallampati: III  TM Distance: >3 FB Neck ROM: Full    Dental  (+) Poor Dentition   Pulmonary sleep apnea , neg COPD, Current Smoker,    breath sounds clear to auscultation- rhonchi (-) wheezing      Cardiovascular hypertension, (-) CAD, (-) Past MI, (-) Cardiac Stents and (-) CABG  Rhythm:Regular Rate:Normal - Systolic murmurs and - Diastolic murmurs    Neuro/Psych neg Seizures PSYCHIATRIC DISORDERS Anxiety Depression negative neurological ROS     GI/Hepatic hiatal hernia, GERD  ,(+) Cirrhosis   Esophageal Varices    ,   Endo/Other  diabetes, Oral Hypoglycemic Agents  Renal/GU Renal InsufficiencyRenal disease     Musculoskeletal  (+) Arthritis ,   Abdominal (+) + obese,   Peds  Hematology  (+) anemia ,   Anesthesia Other Findings Past Medical History: No date: Anemia No date: Anxiety     Comment:  controlled;  No date: Arthritis No date: AVM (arteriovenous malformation) of stomach, acquired with  hemorrhage No date: Barrett's esophagus No date: Chronic kidney disease     Comment:  renal infufficiency No date: Cirrhosis ([place]) No date: Depression     Comment:  controlled;  No date: Diabetes mellitus without complication (HCC)     Comment:  not controlled, taking insulin but sugar continues to               run high;  No date: Edema No date: Esophageal varices (HCC) No date: GAVE (gastric antral vascular ectasia) No date: GERD (gastroesophageal reflux disease) No date: History of hiatal hernia No date: Hyperlipidemia No date: Hypertension     Comment:  controlled well;  No date: Liver cirrhosis (HCC) No date: Nephropathy,  diabetic ([place]) No date: Obesity No date: Pancytopenia ([place]) No date: Polyp, stomach     Comment:  with chronic blood loss No date: Sleep apnea     Comment:  does not wear a cpap, Medicare would not pay for it  No date: Venous stasis dermatitis of both lower extremities No date: Venous stasis of both lower extremities     Comment:  with cellulitis   Reproductive/Obstetrics                             Anesthesia Physical Anesthesia Plan  ASA: III  Anesthesia Plan: General   Post-op Pain Management:    Induction: Intravenous  PONV Risk Score and Plan: 0 and Propofol infusion  Airway Management Planned: Natural Airway  Additional Equipment:   Intra-op Plan:   Post-operative Plan:   Informed Consent: I have reviewed the patients History and Physical, chart, labs and discussed the procedure including the risks, benefits and alternatives for the proposed anesthesia with the patient or authorized representative who has indicated his/her understanding and acceptance.     Dental advisory given  Plan Discussed with: CRNA and Anesthesiologist  Anesthesia Plan Comments: (Discussed possible intubation if bleeding varices are found)        Anesthesia Quick Evaluation

## 2019-03-06 NOTE — Discharge Summary (Signed)
[place] at [facility], 68 y.o., DOB 1951-06-08, MRN [id number]. Admission date: 03/05/2019 Discharge Date 03/06/2019 Primary MD [place] Hire, MD Admitting Physician Dustin Flock, MD  Admission Diagnosis  Hepatic encephalopathy ([place]) [K72.90] Confusion [R41.0] Occult GI bleeding [R19.5] Stage 3 chronic kidney disease ([place]) [N18.3] Hepatic cirrhosis, unspecified hepatic cirrhosis type, unspecified whether ascites present [facility]) [K74.60]  Discharge Diagnosis   Active Problems:   Hepatic encephalopathy ([place]) Upper GI bleed Chronic anemia Chronic kidney disease stage III Liver cirrhosis Diabetes type 2 Hyperlipidemia Chronic congestive heart failure GERD    Hospital Course  Corey West  is a 68 y.o. male with a known history of multiple medical problems including liver cirrhosis, morbid obesity, diabetes type 2, chronic anemia, history of AVMs, chronic kidney disease, depression, GERD, hypertension, hyperlipidemia and sleep apnea who is presenting to the emergency room with complaint of feeling little confused.  Patient was little confused ammonia level was little high.  Patient was admitted for hepatic encephalopathy.  Patient was given high-dose lactulose.  Patient was not taking Xifaxan which is prescribed.  Patient also was noted to have anemia he has a history of GI bleed therefore was seen by GI underwent EGD.  Patient had moderate gastric antral vascular ectasia without bleeding was present in the gastric antrum patient had coagulation for the bleeding with argon.  Patient is stable has been cleared by GI for discharge home.           Consults  GI  Significant Tests:  See full reports for all details     No results found.     Today   Subjective:   Corey West patient feeling well he wants to go home does not want physical therapy Objective:   Blood pressure (!) 145/63, pulse 72, temperature 97.8 F (36.6 C), temperature  source Tympanic, resp. rate 18, SpO2 98 %.  .  Intake/Output Summary (Last 24 hours) at 03/06/2019 1432 Last data filed at 03/06/2019 1316 Gross per 24 hour  Intake 300 ml  Output -  Net 300 ml    Exam VITAL SIGNS: Blood pressure (!) 145/63, pulse 72, temperature 97.8 F (36.6 C), temperature source Tympanic, resp. rate 18, SpO2 98 %.  GENERAL:  68 y.o.-year-old patient lying in the bed with no acute distress.  EYES: Pupils equal, round, reactive to light and accommodation. No scleral icterus. Extraocular muscles intact.  HEENT: Head atraumatic, normocephalic. Oropharynx and nasopharynx clear.  NECK:  Supple, no jugular venous distention. No thyroid enlargement, no tenderness.  LUNGS: Normal breath sounds bilaterally, no wheezing, rales,rhonchi or crepitation. No use of accessory muscles of respiration.  CARDIOVASCULAR: S1, S2 normal. No murmurs, rubs, or gallops.  ABDOMEN: Soft, nontender, nondistended. Bowel sounds present. No organomegaly or mass.  EXTREMITIES: No pedal edema, cyanosis, or clubbing.  NEUROLOGIC: Cranial nerves II through XII are intact. Muscle strength 5/5 in all extremities. Sensation intact. Gait not checked.  PSYCHIATRIC: The patient is alert and oriented x 3.  SKIN: No obvious rash, lesion, or ulcer.   Data Review     CBC w Diff:  Lab Results  Component Value Date   WBC 4.1 03/06/2019   HGB 8.0 (L) 03/06/2019   HGB 9.7 (L) 09/14/2014   HCT 25.1 (L) 03/06/2019   HCT 28.2 (L) 09/14/2014   PLT 109 (L) 03/06/2019   PLT 106 (L) 09/14/2014   LYMPHOPCT 13 03/05/2019   LYMPHOPCT 16.2 09/14/2014   MONOPCT 10 03/05/2019  MONOPCT 6.2 09/14/2014   EOSPCT 4 03/05/2019   EOSPCT 3.9 09/14/2014   BASOPCT 0 03/05/2019   BASOPCT 0.6 09/14/2014   CMP:  Lab Results  Component Value Date   NA 143 03/06/2019   NA 138 09/14/2014   K 3.5 03/06/2019   K 3.4 (L) 09/14/2014   CL 106 03/06/2019   CL 104 09/14/2014   CO2 29 03/06/2019   CO2 28 09/14/2014   BUN 33  (H) 03/06/2019   BUN 18 09/14/2014   CREATININE 2.14 (H) 03/06/2019   CREATININE 2.05 (H) 09/14/2014   PROT 6.6 03/05/2019   PROT 6.4 09/11/2014   ALBUMIN 2.9 (L) 03/05/2019   ALBUMIN 2.4 (L) 09/11/2014   BILITOT 0.8 03/05/2019   BILITOT 1.0 09/11/2014   ALKPHOS 75 03/05/2019   ALKPHOS 52 09/11/2014   AST 30 03/05/2019   AST 18 09/11/2014   ALT 25 03/05/2019   ALT 18 09/11/2014  .  Micro Results Recent Results (from the past 240 hour(s))  Urine Culture     Status: Abnormal   Collection Time: 03/05/19 10:50 AM  Result Value Ref Range Status   Specimen Description   Final    URINE, RANDOM Performed at [facility], [address], [place], [place] [address]    Special Requests   Final    NONE Performed at [facility], [address] [place], [place] [address]    Culture (A)  Final    20,000 COLONIES/mL MULTIPLE SPECIES PRESENT, SUGGEST RECOLLECTION   Report Status 03/06/2019 FINAL  Final  SARS Coronavirus 2 (CEPHEID - Performed in [place] hospital lab), Hosp Order     Status: None   Collection Time: 03/05/19  1:12 PM  Result Value Ref Range Status   SARS Coronavirus 2 NEGATIVE NEGATIVE Final    Comment: (NOTE) If result is NEGATIVE SARS-CoV-2 target nucleic acids are NOT DETECTED. The SARS-CoV-2 RNA is generally detectable in upper and lower  respiratory specimens during the acute phase of infection. The lowest  concentration of SARS-CoV-2 viral copies this assay can detect is 250  copies / mL. A negative result does not preclude SARS-CoV-2 infection  and should not be used as the sole basis for treatment or other  patient management decisions.  A negative result may occur with  improper specimen collection / handling, submission of specimen other  than nasopharyngeal swab, presence of viral mutation(s) within the  areas targeted by this assay, and inadequate number of viral copies  (<250 copies / mL). A negative result must be combined with  clinical  observations, patient history, and epidemiological information. If result is POSITIVE SARS-CoV-2 target nucleic acids are DETECTED. The SARS-CoV-2 RNA is generally detectable in upper and lower  respiratory specimens dur ing the acute phase of infection.  Positive  results are indicative of active infection with SARS-CoV-2.  Clinical  correlation with patient history and other diagnostic information is  necessary to determine patient infection status.  Positive results do  not rule out bacterial infection or co-infection with other viruses. If result is PRESUMPTIVE POSTIVE SARS-CoV-2 nucleic acids MAY BE PRESENT.   A presumptive positive result was obtained on the submitted specimen  and confirmed on repeat testing.  While 2019 novel coronavirus  (SARS-CoV-2) nucleic acids may be present in the submitted sample  additional confirmatory testing may be necessary for epidemiological  and / or clinical management purposes  to differentiate between  SARS-CoV-2 and other Sarbecovirus currently known to infect humans.  If  clinically indicated additional testing with an alternate test  methodology [phone]) is advised. The SARS-CoV-2 RNA is generally  detectable in upper and lower respiratory sp ecimens during the acute  phase of infection. The expected result is Negative. Fact Sheet for Patients:  StrictlyIdeas.no Fact Sheet for Healthcare Providers: BankingDealers.co.za This test is not yet approved or cleared by the [place] FDA and has been authorized for detection and/or diagnosis of SARS-CoV-2 by FDA under an Emergency Use Authorization (EUA).  This EUA will remain in effect (meaning this test can be used) for the duration of the COVID-19 declaration under Section 564(b)(1) of the Act, 21 U.S.C. section 360bbb-3(b)(1), unless the authorization is terminated or revoked sooner. Performed at [facility], [address] [place], [place] [address]         Code Status Orders  (From admission, onward)         Start     Ordered   03/05/19 1754  Full code  Continuous     03/05/19 1753        Code Status History    Date Active Date Inactive Code Status Order ID Comments User Context   02/03/2019 0541 02/05/2019 1436 Full Code [id number]  Harrie Foreman, MD Inpatient   01/15/2019 0047 01/15/2019 1657 Full Code [id number]  Lance Coon, MD Inpatient   12/02/2018 1713 12/09/2018 1737 Full Code [id number]  Hillary Bow, MD ED   10/16/2018 0054 10/18/2018 1842 Full Code [id number]  [name], MD Inpatient   07/18/2018 2339 07/21/2018 1356 Full Code [id number]  Lance Coon, MD Inpatient   06/18/2018 1609 06/19/2018 1717 Full Code [id number]  Epifanio Lesches, MD ED   12/31/2017 1856 01/01/2018 1205 Full Code [id number]  Saundra Shelling, MD Inpatient   12/25/2017 1456 12/26/2017 1618 Full Code [id number]  Epifanio Lesches, MD ED   12/16/2017 0921 12/18/2017 1635 DNR [id number]  Max Sane, MD Inpatient   12/16/2017 0305 12/16/2017 0921 Full Code [id number]  Salary, Avel Peace, MD ED   12/02/2017 2003 12/05/2017 2000 Full Code [id number]  Saundra Shelling, MD Inpatient   11/21/2017 1948 11/23/2017 1958 Full Code [id number]  Loletha Grayer, MD ED   10/23/2017 1405 10/26/2017 1402 Full Code [id number]  Bettey Costa, MD Inpatient   10/18/2017 0058 10/19/2017 1806 Full Code [id number]  Saundra Shelling, MD Inpatient   10/09/2017 2139 10/11/2017 1806 Full Code [id number]  [name], MD Inpatient   09/03/2017 2124 09/05/2017 1552 Full Code [id number]  Nicholes Mango, MD Inpatient   06/26/2017 1119 06/28/2017 1748 Full Code [id number]  Henreitta Leber, MD Inpatient   02/21/2017 2007 02/22/2017 1803 Full Code [id number]  [name], MD Inpatient   07/20/2015 1245 07/22/2015 2217 Full Code [id number]  Nicholes Mango, MD Inpatient   02/08/2015 2331 02/11/2015 1943 Full Code [id number]  Hower, Aaron Mose, MD ED           Follow-up Information    Baxter Hire, MD Follow up in 5 day(s).   Specialty:  Internal Medicine Contact information: [place] [address] [phone]        Manya Silvas, MD Follow up in 7 day(s).   Specialty:  Gastroenterology Why:  hosp f/u Contact information: [place] [address] [phone]           Discharge Medications   Allergies as of 03/06/2019   No Known Allergies     Medication List    STOP taking these medications   famotidine 20 MG  tablet Commonly known as:  PEPCID     TAKE these medications   atorvastatin 40 MG tablet Commonly known as:  LIPITOR Take 1 tablet (40 mg total) by mouth daily at 6 PM.   cetirizine 10 MG tablet Commonly known as:  ZYRTEC Take 10 mg by mouth daily.   ferrous sulfate 325 (65 FE) MG tablet Take 325 mg by mouth 2 (two) times daily with a meal.   furosemide 40 MG tablet Commonly known as:  LASIX Take 40 mg by mouth 2 (two) times a day.   insulin aspart protamine - aspart (70-30) 100 UNIT/ML FlexPen Commonly known as:  NOVOLOG 70/30 MIX Inject 70-84 Units into the skin See admin instructions. Inject 84u under the skin every morning and 70u under the skin every evening   lactulose 10 GM/15ML solution Commonly known as:  CHRONULAC Take 30 g by mouth 4 (four) times daily.   LORazepam 0.5 MG tablet Commonly known as:  ATIVAN Take 1 tablet (0.5 mg total) by mouth every 8 (eight) hours as needed for anxiety.   metolazone 5 MG tablet Commonly known as:  ZAROXOLYN Take 5 mg by mouth daily.   multivitamin tablet Take 1 tablet by mouth daily.   omeprazole 40 MG capsule Commonly known as:  PRILOSEC Take 40 mg by mouth daily.   pantoprazole 40 MG tablet Commonly known as:  Protonix Take 1 tablet (40 mg total) by mouth 2 (two) times daily.   Potassium Chloride ER 20 MEQ Tbcr Take 20 mEq by mouth 2 (two) times daily. While on lasix' What  changed:  additional instructions   rifaximin 550 MG Tabs tablet Commonly known as:  XIFAXAN Take 1 tablet (550 mg total) by mouth 2 (two) times daily.   traMADol 50 MG tablet Commonly known as:  ULTRAM Take 50 mg by mouth 3 (three) times daily.   vitamin C 250 MG tablet Commonly known as:  ASCORBIC ACID Take 500 mg by mouth 2 (two) times daily.   zinc sulfate 220 (50 Zn) MG capsule Take 220 mg by mouth daily.          Total Time in preparing paper work, data evaluation and todays exam - 17 minutes  Dustin Flock M.D on 03/06/2019 at 2:32 PM [place]  [phone]

## 2019-03-06 NOTE — Progress Notes (Signed)
PT Cancellation Note  Patient Details Name: Corey West MRN: [id number] DOB: [birth year]   Cancelled Treatment:    Reason Eval/Treat Not Completed: Patient at procedure or test/unavailable Pt out of room for procedure, will try back at later time/date as medically appropriate.  Kreg Shropshire, DPT 03/06/2019, 1:42 PM

## 2019-03-06 NOTE — Interval H&P Note (Signed)
History and Physical Interval Note:  03/06/2019 8:54 AM  Corey West  has presented today for surgery, with the diagnosis of GI Bleed, Anemia.  The various methods of treatment have been discussed with the patient and family. After consideration of risks, benefits and other options for treatment, the patient has consented to  Procedure(s): ESOPHAGOGASTRODUODENOSCOPY (EGD) (N/A) as a surgical intervention.  The patient's history has been reviewed, patient examined, no change in status, stable for surgery.  I have reviewed the patient's chart and labs.  Questions were answered to the patient's satisfaction.     [place], [place]

## 2019-03-06 NOTE — Evaluation (Signed)
Physical Therapy Evaluation Patient Details Name: Corey West MRN: [id number] DOB: 1950-11-08 Today's Date: 03/06/2019   History of Present Illness  68 yo Male here with hepatic encephalopathy; past medical history significant for liver cirrhosis, history of hepatic , esophageal varices, gastric AV malformations, diabetes hypertension obesity.    Clinical Impression  Pt did relatively well with PT and though he has some baseline issues with LEs generally has been able to be active and independent at home, running errands, etc.  He was able to ambulate ~125 ft with walker and good confidence this date and did not have excessive fatigue or overt safety issues.  Pt is not interested in PT at home and reports he is near his baseline, will likely still need nursing at home.    Follow Up Recommendations No PT follow up    Equipment Recommendations  None recommended by PT    Recommendations for Other Services       Precautions / Restrictions Precautions Precautions: Fall Restrictions Weight Bearing Restrictions: No      Mobility  Bed Mobility Overal bed mobility: Independent             General bed mobility comments: Pt able to get himself to upright sitting w/o assist  Transfers Overall transfer level: Independent Equipment used: Rolling walker (2 wheeled)             General transfer comment: Pt is able to rise to standing w/o assist, able to maintain balance with only minimal UE use  Ambulation/Gait Ambulation/Gait assistance: Supervision Gait Distance (Feet): 125 Feet Assistive device: Rolling walker (2 wheeled)       General Gait Details: Pt was able to maintain relatively consistent cadence and overall reports being near his baseline, expected reliance on the walker with no LOBs.  Stairs            Wheelchair Mobility    Modified Rankin (Stroke Patients Only)       Balance Overall balance assessment: Modified Independent                                            Pertinent Vitals/Pain Pain Assessment: No/denies pain    Home Living Family/patient expects to be discharged to:: Private residence Living Arrangements: Alone Available Help at Discharge: Family(niece and neighbor assist as needed) Type of Home: Apartment Home Access: Stairs to enter Entrance Stairs-Rails: Right Entrance Stairs-Number of Steps: 3-4 stairs (uses R railing)    Home Equipment: Walker - 2 wheels;Cane - single point      Prior Function Level of Independence: Independent with assistive device(s)         Comments: Able to run errands, grocery shop etc in limited context     Hand Dominance        Extremity/Trunk Assessment   Upper Extremity Assessment Upper Extremity Assessment: Generalized weakness;Overall [facility] for tasks assessed    Lower Extremity Assessment Lower Extremity Assessment: Generalized weakness;Overall WFL for tasks assessed       Communication   Communication: No difficulties  Cognition Arousal/Alertness: Awake/alert Behavior During Therapy: WFL for tasks assessed/performed Overall Cognitive Status: Within Functional Limits for tasks assessed                                        General  Comments      Exercises     Assessment/Plan    PT Assessment Patent does not need any further PT services  PT Problem List         PT Treatment Interventions      PT Goals (Current goals can be found in the Care Plan section)  Acute Rehab PT Goals Patient Stated Goal: Go home ASAP PT Goal Formulation: All assessment and education complete, DC therapy    Frequency     Barriers to discharge        Co-evaluation               AM-PAC PT "6 Clicks" Mobility  Outcome Measure Help needed turning from your back to your side while in a flat bed without using bedrails?: None Help needed moving from lying on your back to sitting on the side of a flat bed without using bedrails?:  None Help needed moving to and from a bed to a chair (including a wheelchair)?: None Help needed standing up from a chair using your arms (e.g., wheelchair or bedside chair)?: None Help needed to walk in hospital room?: None Help needed climbing 3-5 steps with a railing? : A Little 6 Click Score: 23    End of Session Equipment Utilized During Treatment: Gait belt Activity Tolerance: Patient tolerated treatment well Patient left: with bed alarm set;with call bell/phone within reach Nurse Communication: Mobility status(desire to go home ASAP)      Time: 1455-1520 PT Time Calculation (min) (ACUTE ONLY): 25 min   Charges:   PT Evaluation $PT Eval Low Complexity: 1 Low          Kreg Shropshire, DPT 03/06/2019, 4:23 PM

## 2019-03-06 NOTE — Anesthesia Post-op Follow-up Note (Signed)
Anesthesia QCDR form completed.        

## 2019-03-06 NOTE — Op Note (Signed)
[facility] Gastroenterology Patient Name: Corey West Procedure Date: 03/06/2019 12:53 PM MRN: [id number] Account #: [id number] Date of Birth: 03-11-1951 Admit Type: Inpatient Age: 68 Room: [facility] ENDO ROOM 3 Gender: Male Note Status: Finalized Procedure:            Upper GI endoscopy Indications:          Iron deficiency anemia secondary to chronic blood loss,                        Melena, Cirrhosis with suspected esophageal varices,                        Portal hypertensive gastropathy Providers:            Benay Pike. [name] MD, MD Referring MD:         Baxter Hire, MD (Referring MD) Medicines:            Propofol per Anesthesia Complications:        No immediate complications. Procedure:            Pre-Anesthesia Assessment:                       - The risks and benefits of the procedure and the                        sedation options and risks were discussed with the                        patient. All questions were answered and informed                        consent was obtained.                       - Patient identification and proposed procedure were                        verified prior to the procedure by the nurse. The                        procedure was verified in the procedure room.                       - ASA Grade Assessment: III - A patient with severe                        systemic disease.                       - After reviewing the risks and benefits, the patient                        was deemed in satisfactory condition to undergo the                        procedure.                       After obtaining informed consent, the endoscope was  passed under direct vision. Throughout the procedure,                        the patient's blood pressure, pulse, and oxygen                        saturations were monitored continuously. The Endoscope                        was introduced through the mouth, and advanced to  the                        third part of duodenum. The upper GI endoscopy was                        accomplished without difficulty. The patient tolerated                        the procedure well. Findings:      There were esophageal mucosal changes suggestive of short-segment       Barrett's esophagus present in the lower third of the esophagus. The       maximum longitudinal extent of these mucosal changes was 3 cm in length.       No biopsies taken due to underlying cirrhosis      There is no endoscopic evidence of varices in the entire esophagus.      Moderate gastric antral vascular ectasia without bleeding was present in       the gastric antrum. Coagulation for bleeding prevention using argon       plasma at 1 liter/minute and 20 watts was successful. Estimated blood       loss was minimal.      Moderate portal hypertensive gastropathy was found in the entire       examined stomach.      The examined duodenum was normal. Impression:           - Esophageal mucosal changes suggestive of                        short-segment Barrett's esophagus.                       - Gastric antral vascular ectasia without bleeding.                        Treated with argon plasma coagulation (APC).                       - Portal hypertensive gastropathy.                       - Normal examined duodenum.                       - No specimens collected. Recommendation:       - Patient has a contact number available for                        emergencies. The signs and symptoms of potential                        delayed  complications were discussed with the patient.                        Return to normal activities tomorrow. Written discharge                        instructions were provided to the patient.                       - Resume previous diet.                       - Continue present medications.                       - Discharge OK from a GI standpoint                       - Return to  my office in 3 weeks.                       - The findings and recommendations were discussed with                        the patient.                       - The findings and recommendations were discussed with                        the patient's primary physician. Procedure Code(s):    --- Professional ---                       [phone], Esophagogastroduodenoscopy, flexible, transoral;                        with control of bleeding, any method Diagnosis Code(s):    --- Professional ---                       K74.60, Unspecified cirrhosis of liver                       K92.1, Melena (includes Hematochezia)                       D50.0, Iron deficiency anemia secondary to blood loss                        (chronic)                       K31.89, Other diseases of stomach and duodenum                       K76.6, Portal hypertension                       K31.819, Angiodysplasia of stomach and duodenum without                        bleeding                       K22.8, Other specified diseases  of esophagus CPT copyright 2019 [employer]. All rights reserved. The codes documented in this report are preliminary and upon coder review may  be revised to meet current compliance requirements. Efrain Sella MD, MD 03/06/2019 1:27:23 PM This report has been signed electronically. Number of Addenda: 0 Note Initiated On: 03/06/2019 12:53 PM      [facility]

## 2019-03-06 NOTE — H&P (View-Only) (Signed)
[facility] GI Inpatient Consult Note   Kathline Magic, M.D.  Reason for Consult: Encephalopathy, GI bleed, anemia, Cirrhosis.   Attending Requesting Consult: Dustin Flock, M.D.  Outpatient Primary Physician: [name], M.D.  History of Present Illness: Corey West is a 68 y.o. male with a history of otogenic cirrhosis presents with confusion and admitted yesterday evening for encephalopathy.  Patient also has had progressive anemia requiring a blood transfusion a couple of weeks ago.  Patient has dark stools but he takes iron supplements as well.  He denies any hematemesis or coffee-ground emesis, nausea or vomiting of any kind.  He had some mild confusion but feels more alert today after given some lactulose.  His memory is now optimal for recent and remote medical events although he is alert to place and person. The patient's last reported EGD with esophageal variceal banding occurred approximately 1 year ago at [facility].  He has had an endoscopy in the interim by Dr.Elliott without banding.  Past Medical History:  Past Medical History:  Diagnosis Date  . Anemia   . Anxiety    controlled;   . Arthritis   . AVM (arteriovenous malformation) of stomach, acquired with hemorrhage   . Barrett's esophagus   . Chronic kidney disease    renal infufficiency  . Cirrhosis ([place])   . Depression    controlled;   Marland Kitchen Diabetes mellitus without complication ([place])    not controlled, taking insulin but sugar continues to run high;   . Edema   . Esophageal varices ([place])   . GAVE (gastric antral vascular ectasia)   . GERD (gastroesophageal reflux disease)   . History of hiatal hernia   . Hyperlipidemia   . Hypertension    controlled well;   . Liver cirrhosis ([place])   . Nephropathy, diabetic ([place])   . Obesity   . Pancytopenia ([place])   . Polyp, stomach    with chronic blood loss  . Sleep apnea    does not wear a cpap, Medicare would not pay for it   . Venous stasis  dermatitis of both lower extremities   . Venous stasis of both lower extremities    with cellulitis    Problem List: Patient Active Problem List   Diagnosis Date Noted  . HCAP (healthcare-associated pneumonia) 02/03/2019  . Aphasia 01/14/2019  . Acute on chronic renal failure ([place]) 01/14/2019  . Malnutrition of moderate degree 12/03/2018  . C. difficile diarrhea 12/02/2018  . Ankle fracture, left, closed, initial encounter 10/15/2018  . Ankle fracture, right, closed, initial encounter 10/15/2018  . Ankle fracture 10/15/2018  . Pressure injury of skin 07/19/2018  . UTI (urinary tract infection) 07/18/2018  . CKD (chronic kidney disease) stage 3, GFR 30-59 ml/min (HCC) 01/02/2018  . 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 Goals of care, counseling/discussion   . Palliative care encounter   . Hepatic encephalopathy syndrome ([place]) 12/16/2017  . Hepatic encephalopathy ([place]) 10/18/2017  . Hypokalemia 10/09/2017  . Hyperglycemia 10/09/2017  . Symptomatic anemia 06/26/2017  . Sepsis ([place]) 06/12/2017  . Type 2 diabetes mellitus with diabetic nephropathy, with long-term current use of insulin ([place]) 05/29/2017  . Cellulitis in diabetic foot ([place]) 02/21/2017  . Cellulitis 02/21/2017  . GI bleed 07/20/2015  . Hematemesis 02/08/2015  . Cirrhosis ([place]) 02/08/2015  . Esophageal varices ([place]) 02/08/2015  . Gastroesophageal reflux disease 02/08/2015  . Hyperlipidemia 02/08/2015  . Chronic venous stasis dermatitis of both lower extremities 10/06/2014  . Edema 05/25/2014  .  Hypertension 05/25/2014  . Obesity 05/25/2014  . Sleep apnea 05/25/2014  . Barrett's esophagus 05/25/2014  . Pancytopenia ([place]) 05/25/2014  . Iron deficiency anemia 12/04/2013  . Thrombocytopenia ([place]) 12/04/2013  . Splenomegaly 11/04/2013    Past Surgical History: Past Surgical History:  Procedure Laterality Date  . ESOPHAGOGASTRODUODENOSCOPY N/A 02/09/2015   Procedure: ESOPHAGOGASTRODUODENOSCOPY (EGD);   Surgeon: Manya Silvas, MD;  Location: [facility] ENDOSCOPY;  Service: Endoscopy;  Laterality: N/A;  . ESOPHAGOGASTRODUODENOSCOPY N/A 07/22/2015   Procedure: ESOPHAGOGASTRODUODENOSCOPY (EGD);  Surgeon: Manya Silvas, MD;  Location: [facility] ENDOSCOPY;  Service: Endoscopy;  Laterality: N/A;  . ESOPHAGOGASTRODUODENOSCOPY N/A 06/26/2017   Procedure: ESOPHAGOGASTRODUODENOSCOPY (EGD);  Surgeon: Manya Silvas, MD;  Location: [facility] ENDOSCOPY;  Service: Endoscopy;  Laterality: N/A;  . ESOPHAGOGASTRODUODENOSCOPY N/A 06/27/2017   Procedure: ESOPHAGOGASTRODUODENOSCOPY (EGD);  Surgeon: Manya Silvas, MD;  Location: [facility] ENDOSCOPY;  Service: Endoscopy;  Laterality: N/A;  . ESOPHAGOGASTRODUODENOSCOPY N/A 06/28/2017   Procedure: ESOPHAGOGASTRODUODENOSCOPY (EGD);  Surgeon: Manya Silvas, MD;  Location: [facility] ENDOSCOPY;  Service: Endoscopy;  Laterality: N/A;  . ESOPHAGOGASTRODUODENOSCOPY Bilateral 10/11/2017   Procedure: ESOPHAGOGASTRODUODENOSCOPY (EGD);  Surgeon: Manya Silvas, MD;  Location: [facility] ENDOSCOPY;  Service: Endoscopy;  Laterality: Bilateral;  . ESOPHAGOGASTRODUODENOSCOPY N/A 12/04/2017   Procedure: ESOPHAGOGASTRODUODENOSCOPY (EGD);  Surgeon: Manya Silvas, MD;  Location: [facility] ENDOSCOPY;  Service: Endoscopy;  Laterality: N/A;  . ESOPHAGOGASTRODUODENOSCOPY (EGD) WITH PROPOFOL N/A 07/20/2015   Procedure: ESOPHAGOGASTRODUODENOSCOPY (EGD) WITH PROPOFOL;  Surgeon: Manya Silvas, MD;  Location: [facility] ENDOSCOPY;  Service: Endoscopy;  Laterality: N/A;  . ESOPHAGOGASTRODUODENOSCOPY (EGD) WITH PROPOFOL N/A 09/16/2015   Procedure: ESOPHAGOGASTRODUODENOSCOPY (EGD) WITH PROPOFOL;  Surgeon: Manya Silvas, MD;  Location: [facility] ENDOSCOPY;  Service: Endoscopy;  Laterality: N/A;  . ESOPHAGOGASTRODUODENOSCOPY (EGD) WITH PROPOFOL N/A 03/16/2016   Procedure: ESOPHAGOGASTRODUODENOSCOPY (EGD) WITH PROPOFOL;  Surgeon: Manya Silvas, MD;  Location: [facility] ENDOSCOPY;  Service: Endoscopy;  Laterality: N/A;  .  ESOPHAGOGASTRODUODENOSCOPY (EGD) WITH PROPOFOL N/A 09/14/2016   Procedure: ESOPHAGOGASTRODUODENOSCOPY (EGD) WITH PROPOFOL;  Surgeon: Manya Silvas, MD;  Location: [facility] ENDOSCOPY;  Service: Endoscopy;  Laterality: N/A;  . ESOPHAGOGASTRODUODENOSCOPY (EGD) WITH PROPOFOL N/A 11/05/2016   Procedure: ESOPHAGOGASTRODUODENOSCOPY (EGD) WITH PROPOFOL;  Surgeon: Manya Silvas, MD;  Location: [facility] ENDOSCOPY;  Service: Endoscopy;  Laterality: N/A;  . ESOPHAGOGASTRODUODENOSCOPY (EGD) WITH PROPOFOL N/A 02/06/2017   Procedure: ESOPHAGOGASTRODUODENOSCOPY (EGD) WITH PROPOFOL;  Surgeon: Manya Silvas, MD;  Location: [facility] ENDOSCOPY;  Service: Endoscopy;  Laterality: N/A;  . ESOPHAGOGASTRODUODENOSCOPY (EGD) WITH PROPOFOL N/A 04/24/2017   Procedure: ESOPHAGOGASTRODUODENOSCOPY (EGD) WITH PROPOFOL;  Surgeon: Manya Silvas, MD;  Location: [facility] ENDOSCOPY;  Service: Endoscopy;  Laterality: N/A;  . ESOPHAGOGASTRODUODENOSCOPY (EGD) WITH PROPOFOL N/A 07/31/2017   Procedure: ESOPHAGOGASTRODUODENOSCOPY (EGD) WITH PROPOFOL;  Surgeon: Manya Silvas, MD;  Location: [facility] ENDOSCOPY;  Service: Endoscopy;  Laterality: N/A;  . ESOPHAGOGASTRODUODENOSCOPY (EGD) WITH PROPOFOL N/A 08/08/2018   Procedure: ESOPHAGOGASTRODUODENOSCOPY (EGD) WITH PROPOFOL;  Surgeon: Manya Silvas, MD;  Location: [facility] ENDOSCOPY;  Service: Endoscopy;  Laterality: N/A;  . GIVENS CAPSULE STUDY N/A 12/05/2017   Procedure: GIVENS CAPSULE STUDY;  Surgeon: Manya Silvas, MD;  Location: [facility] ENDOSCOPY;  Service: Endoscopy;  Laterality: N/A;  . TONSILLECTOMY    . TONSILLECTOMY AND ADENOIDECTOMY    . ULNAR NERVE TRANSPOSITION    . UVULOPALATOPHARYNGOPLASTY      Allergies: No Known Allergies  Home Medications: Medications Prior to Admission  Medication Sig Dispense Refill Last Dose  . atorvastatin (LIPITOR) 40 MG tablet Take 1 tablet (40 mg total) by  mouth daily at 6 PM. 30 tablet 0 unknown at unknown  . cetirizine (ZYRTEC) 10 MG tablet Take 10 mg by  mouth daily.   unknown at unknown  . famotidine (PEPCID) 20 MG tablet Take 20 mg by mouth 2 (two) times daily.   unknown at unknown  . ferrous sulfate 325 (65 FE) MG tablet Take 325 mg by mouth 2 (two) times daily with a meal.    unknown at unknown  . furosemide (LASIX) 40 MG tablet Take 40 mg by mouth 2 (two) times a day.    unknown at unknown  . insulin aspart protamine - aspart (NOVOLOG 70/30 MIX) (70-30) 100 UNIT/ML FlexPen Inject 70-84 Units into the skin See admin instructions. Inject 84u under the skin every morning and 70u under the skin every evening   unknown at unknown  . lactulose (CHRONULAC) 10 GM/15ML solution Take 30 g by mouth 4 (four) times daily.    unknown at unknown  . LORazepam (ATIVAN) 0.5 MG tablet Take 1 tablet (0.5 mg total) by mouth every 8 (eight) hours as needed for anxiety. 20 tablet 0 prn at prn  . metolazone (ZAROXOLYN) 5 MG tablet Take 5 mg by mouth daily.   unknown at unknown  . Multiple Vitamin (MULTIVITAMIN) tablet Take 1 tablet by mouth daily.   unknown at unknown  . omeprazole (PRILOSEC) 40 MG capsule Take 40 mg by mouth daily.   unknown at unknown  . Potassium Chloride ER 20 MEQ TBCR Take 20 mEq by mouth 2 (two) times daily. While on lasix' (Patient taking differently: Take 20 mEq by mouth 2 (two) times daily. ) 30 tablet 1 unknown at unknown  . traMADol (ULTRAM) 50 MG tablet Take 50 mg by mouth 3 (three) times daily.    unknown at unknown  . vitamin C (ASCORBIC ACID) 250 MG tablet Take 500 mg by mouth 2 (two) times daily.   unknown at unknown  . zinc sulfate 220 (50 Zn) MG capsule Take 220 mg by mouth daily.   unknown at unknown   Home medication reconciliation was completed with the patient.   Scheduled Inpatient Medications:   . atorvastatin  40 mg Oral q1800  . dextromethorphan  30 mg Oral BID  . famotidine  20 mg Oral BID  . ferrous sulfate  325 mg Oral BID WC  . furosemide  40 mg Oral BID  . insulin aspart  0-5 Units Subcutaneous QHS  . insulin  aspart  0-9 Units Subcutaneous TID WC  . insulin aspart protamine- aspart  70 Units Subcutaneous BID WC  . lactulose  30 g Oral QID  . loperamide  2 mg Oral TID  . loratadine  10 mg Oral Daily  . metolazone  5 mg Oral Daily  . multivitamin with minerals  1 tablet Oral Daily  . pantoprazole (PROTONIX) IV  40 mg Intravenous Q12H  . potassium chloride SA  20 mEq Oral BID  . rifaximin  550 mg Oral BID  . vitamin C  500 mg Oral BID  . zinc sulfate  220 mg Oral Daily    Continuous Inpatient Infusions:    PRN Inpatient Medications:  acetaminophen **OR** acetaminophen, LORazepam, ondansetron **OR** ondansetron (ZOFRAN) IV, traMADol  Family History: family history includes CAD in his father; Diabetes in an other family member; Transient ischemic attack in his father.   GI Family History: Negative  Social History:   reports that he has been smoking cigars. He has smoked for the past 20.00 years.  He has never used smokeless tobacco. He reports that he does not drink alcohol or use drugs. The patient denies ETOH, tobacco, or drug use.    Review of Systems: Review of Systems - Negative except History of present illness  Physical Examination: BP (!) 151/58 (BP Location: Left Arm)   Pulse 76   Temp (!) 97.3 F (36.3 C) (Oral)   Resp 16   SpO2 97%  Physical Exam Constitutional:      Appearance: He is obese. He is not ill-appearing or diaphoretic.  HENT:     Head: Normocephalic.     Nose: Nose normal.  Eyes:     Conjunctiva/sclera: Conjunctivae normal.     Pupils: Pupils are equal, round, and reactive to light.  Cardiovascular:     Rate and Rhythm: Normal rate.     Heart sounds: No murmur. No gallop.   Pulmonary:     Effort: Pulmonary effort is normal.     Breath sounds: Normal breath sounds.  Abdominal:     General: There is distension.     Palpations: Abdomen is soft. There is no mass.     Tenderness: There is no abdominal tenderness. There is no guarding or rebound.      Hernia: No hernia is present.  Musculoskeletal:        General: Swelling present.     Right lower leg: Edema present.     Left lower leg: Edema present.  Skin:    General: Skin is warm and dry.     Coloration: Skin is not jaundiced.  Neurological:     Mental Status: He is alert. Mental status is at baseline.  Psychiatric:        Mood and Affect: Mood normal.     Data: Lab Results  Component Value Date   WBC 4.1 03/06/2019   HGB 8.0 (L) 03/06/2019   HCT 25.1 (L) 03/06/2019   MCV 94.0 03/06/2019   PLT 109 (L) 03/06/2019   Recent Labs  Lab 03/05/19 1050 03/06/19 0502  HGB 8.3* 8.0*   Lab Results  Component Value Date   NA 143 03/06/2019   K 3.5 03/06/2019   CL 106 03/06/2019   CO2 29 03/06/2019   BUN 33 (H) 03/06/2019   CREATININE 2.14 (H) 03/06/2019   Lab Results  Component Value Date   ALT 25 03/05/2019   AST 30 03/05/2019   ALKPHOS 75 03/05/2019   BILITOT 0.8 03/05/2019   Recent Labs  Lab 03/05/19 1050  APTT 28  INR 1.2   CBC Latest Ref Rng & Units 03/06/2019 03/05/2019 02/24/2019  WBC 4.0 - 10.5 K/uL 4.1 4.7 -  Hemoglobin 13.0 - 17.0 g/dL 8.0(L) 8.3(L) 8.1(L)  Hematocrit 39.0 - 52.0 % 25.1(L) 25.1(L) -  Platelets 150 - 400 K/uL 109(L) 115(L) -    STUDIES: No results found. @IMAGES @  Assessment: 1.  Questionable melena with anemia secondary to blood loss- currently stable after blood transfusion but is happening at some interval that needs to be investigated.  2.  Hepatic encephalopathy-improved with lactulose.  3.  Obesity.  4.  History of gave.  5 diabetes mellitus  COVID-19 status:     Tested negative     Recommendations:  1.  Proceed with EGD.The patient understands the nature of the planned procedure, indications, risks, alternatives and potential complications including but not limited to bleeding, infection, perforation, damage to internal organs and possible oversedation/side effects from anesthesia. The patient agrees and gives  consent to proceed.  Please  refer to procedure notes for findings, recommendations and patient disposition/instructions.  Thank you for the consult. Please call with questions or concerns.  Olean Ree, "[name] Hurst MD [facility] [place] [employer], [place] [address] [phone]  03/06/2019 8:47 AM

## 2019-03-06 NOTE — Anesthesia Postprocedure Evaluation (Signed)
Anesthesia Post Note  Patient: Corey West  Procedure(s) Performed: ESOPHAGOGASTRODUODENOSCOPY (EGD) (N/A )  Patient location during evaluation: Endoscopy Anesthesia Type: General Level of consciousness: awake and alert and oriented Pain management: pain level controlled Vital Signs Assessment: post-procedure vital signs reviewed and stable Respiratory status: spontaneous breathing, nonlabored ventilation and respiratory function stable Cardiovascular status: blood pressure returned to baseline and stable Postop Assessment: no signs of nausea or vomiting Anesthetic complications: no     Last Vitals:  Vitals:   03/06/19 1323 03/06/19 1359  BP: (!) 136/50 (!) 145/63  Pulse:  72  Resp:  18  Temp: 36.6 C   SpO2:  98%    Last Pain:  Vitals:   03/06/19 1359  TempSrc:   PainSc: 0-No pain                 Breck [place]

## 2019-03-12 ENCOUNTER — Other Ambulatory Visit: Admission: RE | Admit: 2019-03-12 | Payer: Medicare Other | Source: Ambulatory Visit

## 2019-03-14 ENCOUNTER — Emergency Department
Admission: EM | Admit: 2019-03-14 | Discharge: 2019-03-15 | Disposition: A | Discharge status: Home / Self Care | Payer: Medicare Other | Attending: Emergency Medicine | Admitting: Emergency Medicine

## 2019-03-14 ENCOUNTER — Emergency Department: Payer: Medicare Other

## 2019-03-14 ENCOUNTER — Other Ambulatory Visit: Payer: Self-pay

## 2019-03-14 DIAGNOSIS — R1110 Vomiting, unspecified: Secondary | ICD-10-CM | POA: Diagnosis not present

## 2019-03-14 DIAGNOSIS — Z87891 Personal history of nicotine dependence: Secondary | ICD-10-CM | POA: Diagnosis not present

## 2019-03-14 DIAGNOSIS — Z20828 Contact with and (suspected) exposure to other viral communicable diseases: Secondary | ICD-10-CM | POA: Insufficient documentation

## 2019-03-14 DIAGNOSIS — R509 Fever, unspecified: Secondary | ICD-10-CM | POA: Diagnosis not present

## 2019-03-14 DIAGNOSIS — I129 Hypertensive chronic kidney disease with stage 1 through stage 4 chronic kidney disease, or unspecified chronic kidney disease: Secondary | ICD-10-CM | POA: Insufficient documentation

## 2019-03-14 DIAGNOSIS — N183 Chronic kidney disease, stage 3 (moderate): Secondary | ICD-10-CM | POA: Diagnosis not present

## 2019-03-14 DIAGNOSIS — F419 Anxiety disorder, unspecified: Secondary | ICD-10-CM | POA: Insufficient documentation

## 2019-03-14 DIAGNOSIS — E1122 Type 2 diabetes mellitus with diabetic chronic kidney disease: Secondary | ICD-10-CM | POA: Diagnosis not present

## 2019-03-14 DIAGNOSIS — Z794 Long term (current) use of insulin: Secondary | ICD-10-CM | POA: Insufficient documentation

## 2019-03-14 DIAGNOSIS — Z79899 Other long term (current) drug therapy: Secondary | ICD-10-CM | POA: Insufficient documentation

## 2019-03-14 DIAGNOSIS — R112 Nausea with vomiting, unspecified: Secondary | ICD-10-CM | POA: Diagnosis present

## 2019-03-14 LAB — COMPREHENSIVE METABOLIC PANEL
ALT: 39 U/L (ref 0–44)
AST: 60 U/L — ABNORMAL HIGH (ref 15–41)
Albumin: 3.1 g/dL — ABNORMAL LOW (ref 3.5–5.0)
Alkaline Phosphatase: 86 U/L (ref 38–126)
Anion gap: 11 (ref 5–15)
BUN: 26 mg/dL — ABNORMAL HIGH (ref 8–23)
CO2: 25 mmol/L (ref 22–32)
Calcium: 9 mg/dL (ref 8.9–10.3)
Chloride: 102 mmol/L (ref 98–111)
Creatinine, Ser: 2.1 mg/dL — ABNORMAL HIGH (ref 0.61–1.24)
GFR calc Af Amer: 37 mL/min — ABNORMAL LOW (ref >60)
GFR calc non Af Amer: 32 mL/min — ABNORMAL LOW (ref >60)
Glucose, Bld: 136 mg/dL — ABNORMAL HIGH (ref 70–99)
Potassium: 3.3 mmol/L — ABNORMAL LOW (ref 3.5–5.1)
Sodium: 138 mmol/L (ref 135–145)
Total Bilirubin: 0.9 mg/dL (ref 0.3–1.2)
Total Protein: 6.6 g/dL (ref 6.5–8.1)

## 2019-03-14 LAB — FIBRIN DERIVATIVES D-DIMER (ARMC ONLY): Fibrin derivatives D-dimer (ARMC): 1119.75 ng/mL (FEU) — ABNORMAL HIGH (ref 0.00–499.00)

## 2019-03-14 LAB — CBC WITH DIFFERENTIAL/PLATELET
Abs Immature Granulocytes: 0.11 10*3/uL — ABNORMAL HIGH (ref 0.00–0.07)
Basophils Absolute: 0 10*3/uL (ref 0.0–0.1)
Basophils Relative: 0 %
Eosinophils Absolute: 0.1 10*3/uL (ref 0.0–0.5)
Eosinophils Relative: 1 %
HCT: 26.1 % — ABNORMAL LOW (ref 39.0–52.0)
Hemoglobin: 8.6 g/dL — ABNORMAL LOW (ref 13.0–17.0)
Immature Granulocytes: 1 %
Lymphocytes Relative: 3 %
Lymphs Abs: 0.3 10*3/uL — ABNORMAL LOW (ref 0.7–4.0)
MCH: 30.6 pg (ref 26.0–34.0)
MCHC: 33 g/dL (ref 30.0–36.0)
MCV: 92.9 fL (ref 80.0–100.0)
Monocytes Absolute: 0.7 10*3/uL (ref 0.1–1.0)
Monocytes Relative: 5 %
Neutro Abs: 11.6 10*3/uL — ABNORMAL HIGH (ref 1.7–7.7)
Neutrophils Relative %: 90 %
Platelets: 103 10*3/uL — ABNORMAL LOW (ref 150–400)
RBC: 2.81 MIL/uL — ABNORMAL LOW (ref 4.22–5.81)
RDW: 14.7 % (ref 11.5–15.5)
WBC: 12.8 10*3/uL — ABNORMAL HIGH (ref 4.0–10.5)
nRBC: 0 % (ref 0.0–0.2)

## 2019-03-14 LAB — URINALYSIS, COMPLETE (UACMP) WITH MICROSCOPIC
Bacteria, UA: NONE SEEN
Bilirubin Urine: NEGATIVE
Glucose, UA: NEGATIVE mg/dL
Ketones, ur: NEGATIVE mg/dL
Leukocytes,Ua: NEGATIVE
Nitrite: NEGATIVE
Protein, ur: 100 mg/dL — AB
Specific Gravity, Urine: 1.016 (ref 1.005–1.030)
pH: 5 (ref 5.0–8.0)

## 2019-03-14 LAB — SARS CORONAVIRUS 2 BY RT PCR (HOSPITAL ORDER, PERFORMED IN ~~LOC~~ HOSPITAL LAB): SARS Coronavirus 2: NEGATIVE

## 2019-03-14 LAB — PROCALCITONIN: Procalcitonin: 0.24 ng/mL

## 2019-03-14 LAB — LACTATE DEHYDROGENASE: LDH: 195 U/L — ABNORMAL HIGH (ref 98–192)

## 2019-03-14 LAB — LIPASE, BLOOD: Lipase: 48 U/L (ref 11–51)

## 2019-03-14 LAB — LACTIC ACID, PLASMA: Lactic Acid, Venous: 1.8 mmol/L (ref 0.5–1.9)

## 2019-03-14 LAB — FERRITIN: Ferritin: 42 ng/mL (ref 24–336)

## 2019-03-14 MED ORDER — ONDANSETRON HCL 4 MG/2ML IJ SOLN: 4.0000 mg | Freq: Once | INTRAMUSCULAR | Status: DC

## 2019-03-14 MED ORDER — ACETAMINOPHEN 325 MG PO TABS: 650.0000 mg | ORAL_TABLET | Freq: Once | ORAL | Status: DC

## 2019-03-14 NOTE — ED Triage Notes (Signed)
PT to ED via EMS from home. PT was just d/c from hospital on Friday for "bleeding". Today pt began vomiting and shaking, approx 4 hours ago. Pt has cough, denies SOB. Alert and oriented.

## 2019-03-14 NOTE — ED Notes (Signed)
Pt arrives to ED via EMS with c/o sudden onset cough and SHOB 2 hours prior.  Pt also reports chills.  Pt with increasing nausea.  Pt temp en route 102, received tylenol en route.  CBG 167.

## 2019-03-14 NOTE — Discharge Instructions (Addendum)
Please seek medical attention for any high fevers, chest pain, shortness of breath, change in behavior, persistent vomiting, bloody stool or any other new or concerning symptoms.  

## 2019-03-14 NOTE — ED Provider Notes (Signed)
Patient's blood work ordered by [employer] provider without any significant leukocytosis or lactic acidosis.  Patient's urine without any signs consistent with urinary tract infection.  Patient's x-ray was negative.  When I went to discuss the results with the patient he stated that he felt fine.  He did inquire if he would be able to be discharged home.  I had a discussion with the patient.  I did offer admission given fever for further observation work-up.  Patient did feel comfortable going home and I think this is reasonable given mild elevated white count and no lactic acidosis.  Think at this point potentially viral cause of the patient's fever.  We did have a discussion about strict return precautions and follow-up.   Nance Pear, MD 03/14/19 2308

## 2019-03-14 NOTE — ED Provider Notes (Signed)
[facility] Emergency Department Provider Note   ____________________________________________   First MD Initiated Contact with Patient 03/14/19 1936     (approximate)  I have reviewed the triage vital signs and the nursing notes.   HISTORY  Chief Complaint Fever and Emesis    HPI Corey West is a 68 y.o. male patient arrived via EMS from home.  Patient was discharged from hospital 1 week ago for diagnosis of hepatic encephalopathy, confusion occult GI bleed and stage III chronic kidney disease.  Patient states felt fine for the first few days after discharge but then started vomiting nausea, emesis, and fever.  Patient complains of fatigue and mild dyspnea.         Past Medical History:  Diagnosis Date  . Anemia   . Anxiety    controlled;   . Arthritis   . AVM (arteriovenous malformation) of stomach, acquired with hemorrhage   . Barrett's esophagus   . Chronic kidney disease    renal infufficiency  . Cirrhosis ([place])   . Depression    controlled;   Marland Kitchen Diabetes mellitus without complication ([place])    not controlled, taking insulin but sugar continues to run high;   . Edema   . Esophageal varices ([place])   . GAVE (gastric antral vascular ectasia)   . GERD (gastroesophageal reflux disease)   . History of hiatal hernia   . Hyperlipidemia   . Hypertension    controlled well;   . Liver cirrhosis ([place])   . 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 Obesity   . Pancytopenia ([place])   . Polyp, stomach    with chronic blood loss  . Sleep apnea    does not wear a cpap, Medicare would not pay for it   . Venous stasis dermatitis of both lower extremities   . Venous stasis of both lower extremities    with cellulitis    Patient Active Problem List   Diagnosis Date Noted  . HCAP (healthcare-associated pneumonia) 02/03/2019  . Aphasia 01/14/2019  . Acute on chronic renal failure ([place]) 01/14/2019  . Malnutrition of moderate degree 12/03/2018  . C. difficile  diarrhea 12/02/2018  . Ankle fracture, left, closed, initial encounter 10/15/2018  . Ankle fracture, right, closed, initial encounter 10/15/2018  . Ankle fracture 10/15/2018  . Pressure injury of skin 07/19/2018  . UTI (urinary tract infection) 07/18/2018  . CKD (chronic kidney disease) stage 3, GFR 30-59 ml/min (HCC) 01/02/2018  . 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 Goals of care, counseling/discussion   . Palliative care encounter   . Hepatic encephalopathy syndrome ([place]) 12/16/2017  . Hepatic encephalopathy ([place]) 10/18/2017  . Hypokalemia 10/09/2017  . Hyperglycemia 10/09/2017  . Symptomatic anemia 06/26/2017  . Sepsis ([place]) 06/12/2017  . Type 2 diabetes mellitus with diabetic nephropathy, with long-term current use of insulin ([place]) 05/29/2017  . Cellulitis in diabetic foot ([place]) 02/21/2017  . Cellulitis 02/21/2017  . GI bleed 07/20/2015  . Hematemesis 02/08/2015  . Cirrhosis ([place]) 02/08/2015  . Esophageal varices ([place]) 02/08/2015  . Gastroesophageal reflux disease 02/08/2015  . Hyperlipidemia 02/08/2015  . Chronic venous stasis dermatitis of both lower extremities 10/06/2014  . Edema 05/25/2014  . Hypertension 05/25/2014  . Obesity 05/25/2014  . Sleep apnea 05/25/2014  . Barrett's esophagus 05/25/2014  . Pancytopenia ([place]) 05/25/2014  . Iron deficiency anemia 12/04/2013  . Thrombocytopenia ([place]) 12/04/2013  . Splenomegaly 11/04/2013    Past Surgical History:  Procedure Laterality Date  . ESOPHAGOGASTRODUODENOSCOPY N/A  02/09/2015   Procedure: ESOPHAGOGASTRODUODENOSCOPY (EGD);  Surgeon: Manya Silvas, MD;  Location: [facility] ENDOSCOPY;  Service: Endoscopy;  Laterality: N/A;  . ESOPHAGOGASTRODUODENOSCOPY N/A 07/22/2015   Procedure: ESOPHAGOGASTRODUODENOSCOPY (EGD);  Surgeon: Manya Silvas, MD;  Location: [facility] ENDOSCOPY;  Service: Endoscopy;  Laterality: N/A;  . ESOPHAGOGASTRODUODENOSCOPY N/A 06/26/2017   Procedure: ESOPHAGOGASTRODUODENOSCOPY (EGD);   Surgeon: Manya Silvas, MD;  Location[facility] ENDOSCOPY;  Service: Endoscopy;  Laterality: N/A;  . ESOPHAGOGASTRODUODENOSCOPY N/A 06/27/2017   Procedure: ESOPHAGOGASTRODUODENOSCOPY (EGD);  Surgeon: Manya Silvas, MD;  Location[facility] ENDOSCOPY;  Service: Endoscopy;  Laterality: N/A;  . ESOPHAGOGASTRODUODENOSCOPY N/A 06/28/2017   Procedure: ESOPHAGOGASTRODUODENOSCOPY (EGD);  Surgeon: Manya Silvas, MD;  Location: [facility] ENDOSCOPY;  Service: Endoscopy;  Laterality: N/A;  . ESOPHAGOGASTRODUODENOSCOPY Bilateral 10/11/2017   Procedure: ESOPHAGOGASTRODUODENOSCOPY (EGD);  Surgeon: Manya Silvas, MD;  Location: [facility] ENDOSCOPY;  Service: Endoscopy;  Laterality: Bilateral;  . ESOPHAGOGASTRODUODENOSCOPY N/A 12/04/2017   Procedure: ESOPHAGOGASTRODUODENOSCOPY (EGD);  Surgeon: Manya Silvas, MD;  Location: [facility] ENDOSCOPY;  Service: Endoscopy;  Laterality: N/A;  . ESOPHAGOGASTRODUODENOSCOPY N/A 03/06/2019   Procedure: ESOPHAGOGASTRODUODENOSCOPY (EGD);  Surgeon: Tol[place], Benay Pike, MD;  Location: [facility] ENDOSCOPY;  Service: Gastroenterology;  Laterality: N/A;  . ESOPHAGOGASTRODUODENOSCOPY (EGD) WITH PROPOFOL N/A 07/20/2015   Procedure: ESOPHAGOGASTRODUODENOSCOPY (EGD) WITH PROPOFOL;  Surgeon: Manya Silvas, MD;  Location: [facility] ENDOSCOPY;  Service: Endoscopy;  Laterality: N/A;  . ESOPHAGOGASTRODUODENOSCOPY (EGD) WITH PROPOFOL N/A 09/16/2015   Procedure: ESOPHAGOGASTRODUODENOSCOPY (EGD) WITH PROPOFOL;  Surgeon: Manya Silvas, MD;  Location: [facility] ENDOSCOPY;  Service: Endoscopy;  Laterality: N/A;  . ESOPHAGOGASTRODUODENOSCOPY (EGD) WITH PROPOFOL N/A 03/16/2016   Procedure: ESOPHAGOGASTRODUODENOSCOPY (EGD) WITH PROPOFOL;  Surgeon: Manya Silvas, MD;  Location: [facility] ENDOSCOPY;  Service: Endoscopy;  Laterality: N/A;  . ESOPHAGOGASTRODUODENOSCOPY (EGD) WITH PROPOFOL N/A 09/14/2016   Procedure: ESOPHAGOGASTRODUODENOSCOPY (EGD) WITH PROPOFOL;  Surgeon: Manya Silvas, MD;  Location: [facility] ENDOSCOPY;  Service:  Endoscopy;  Laterality: N/A;  . ESOPHAGOGASTRODUODENOSCOPY (EGD) WITH PROPOFOL N/A 11/05/2016   Procedure: ESOPHAGOGASTRODUODENOSCOPY (EGD) WITH PROPOFOL;  Surgeon: Manya Silvas, MD;  Location: [facility] ENDOSCOPY;  Service: Endoscopy;  Laterality: N/A;  . ESOPHAGOGASTRODUODENOSCOPY (EGD) WITH PROPOFOL N/A 02/06/2017   Procedure: ESOPHAGOGASTRODUODENOSCOPY (EGD) WITH PROPOFOL;  Surgeon: Manya Silvas, MD;  Location: [facility] ENDOSCOPY;  Service: Endoscopy;  Laterality: N/A;  . ESOPHAGOGASTRODUODENOSCOPY (EGD) WITH PROPOFOL N/A 04/24/2017   Procedure: ESOPHAGOGASTRODUODENOSCOPY (EGD) WITH PROPOFOL;  Surgeon: Manya Silvas, MD;  Location: [facility] ENDOSCOPY;  Service: Endoscopy;  Laterality: N/A;  . ESOPHAGOGASTRODUODENOSCOPY (EGD) WITH PROPOFOL N/A 07/31/2017   Procedure: ESOPHAGOGASTRODUODENOSCOPY (EGD) WITH PROPOFOL;  Surgeon: Manya Silvas, MD;  Location: [facility] ENDOSCOPY;  Service: Endoscopy;  Laterality: N/A;  . ESOPHAGOGASTRODUODENOSCOPY (EGD) WITH PROPOFOL N/A 08/08/2018   Procedure: ESOPHAGOGASTRODUODENOSCOPY (EGD) WITH PROPOFOL;  Surgeon: Manya Silvas, MD;  Location: [facility] ENDOSCOPY;  Service: Endoscopy;  Laterality: N/A;  . GIVENS CAPSULE STUDY N/A 12/05/2017   Procedure: GIVENS CAPSULE STUDY;  Surgeon: Manya Silvas, MD;  Location: [facility] ENDOSCOPY;  Service: Endoscopy;  Laterality: N/A;  . TONSILLECTOMY    . TONSILLECTOMY AND ADENOIDECTOMY    . ULNAR NERVE TRANSPOSITION    . UVULOPALATOPHARYNGOPLASTY      Prior to Admission medications   Medication Sig Start Date End Date Taking? Authorizing Provider  atorvastatin (LIPITOR) 40 MG tablet Take 1 tablet (40 mg total) by mouth daily at 6 PM. 02/05/19   Gouru, Aruna, MD  cetirizine (ZYRTEC) 10 MG tablet Take 10 mg by mouth daily.    [provider]  ferrous  sulfate 325 (65 FE) MG tablet Take 325 mg by mouth 2 (two) times daily with a meal.     [provider]  furosemide (LASIX) 40 MG tablet Take 40 mg by mouth 2 (two)  times a day.  01/13/19   [provider]  insulin aspart protamine - aspart (NOVOLOG 70/30 MIX) (70-30) 100 UNIT/ML FlexPen Inject 70-84 Units into the skin See admin instructions. Inject 84u under the skin every morning and 70u under the skin every evening    [provider]  lactulose (CHRONULAC) 10 GM/15ML solution Take 30 g by mouth 4 (four) times daily.  12/11/18   [provider]  LORazepam (ATIVAN) 0.5 MG tablet Take 1 tablet (0.5 mg total) by mouth every 8 (eight) hours as needed for anxiety. 10/18/18   Gladstone Lighter, MD  metolazone (ZAROXOLYN) 5 MG tablet Take 5 mg by mouth daily.    [provider]  Multiple Vitamin (MULTIVITAMIN) tablet Take 1 tablet by mouth daily.    [provider]  omeprazole (PRILOSEC) 40 MG capsule Take 40 mg by mouth daily.    [provider]  pantoprazole (PROTONIX) 40 MG tablet Take 1 tablet (40 mg total) by mouth 2 (two) times daily. 03/06/19 03/05/20  Dustin Flock, MD  Potassium Chloride ER 20 MEQ TBCR Take 20 mEq by mouth 2 (two) times daily. While on lasix' Patient taking differently: Take 20 mEq by mouth 2 (two) times daily.  10/18/18   Gladstone Lighter, MD  rifaximin (XIFAXAN) 550 MG TABS tablet Take 1 tablet (550 mg total) by mouth 2 (two) times daily. 03/06/19   Dustin Flock, MD  traMADol (ULTRAM) 50 MG tablet Take 50 mg by mouth 3 (three) times daily.  12/24/18   [provider]  vitamin C (ASCORBIC ACID) 250 MG tablet Take 500 mg by mouth 2 (two) times daily.    [provider]  zinc sulfate 220 (50 Zn) MG capsule Take 220 mg by mouth daily.    [provider]    Allergies Patient has no known allergies.  Family History  Problem Relation Age of Onset  . Diabetes Other   . Transient ischemic attack Father   . CAD Father     Social History Social History   Tobacco Use  . Smoking status: Former Smoker    Years: 20.00    Types: Cigars  . Smokeless tobacco:  Never Used  Substance Use Topics  . Alcohol use: No    Comment: stopped 15 years ago  . Drug use: No    Review of Systems  Constitutional: Fever.   Eyes: No visual changes. ENT: No sore throat. Cardiovascular: Denies chest pain. Respiratory:  shortness of breath. Gastrointestinal: Mild abdominal pain.  Nausea and vomiting.  No diarrhea.  No constipation. Genitourinary: Negative for dysuria. Musculoskeletal: Negative for back pain. Skin: Negative for rash. Neurological: Negative for headaches, focal weakness or numbness. Endocrine:  Diabetes, hypertension, and hyperlipidemia. Hematological/Lymphatic:  Anemia   ____________________________________________   PHYSICAL EXAM:  VITAL SIGNS: ED Triage Vitals  Enc Vitals Group     BP 03/14/19 1917 (!) 181/62     Pulse Rate 03/14/19 1915 93     Resp 03/14/19 1915 (!) 24     Temp 03/14/19 1915 (!) 101.7 F (38.7 C)     Temp Source 03/14/19 1915 Oral     SpO2 03/14/19 1915 97 %     Weight 03/14/19 1917 285 lb (129.3 kg)     Height 03/14/19  1917 6' (1.829 m)     Head Circumference --      Peak Flow --      Pain Score 03/14/19 1917 0     Pain Loc --      Pain Edu? --      Excl. in [place]? --     Constitutional: Alert and oriented.  Moderate distress and poor historian.   Eyes: Conjunctivae are normal. PERRL. EOMI. Head: Atraumatic. Nose: No congestion/rhinnorhea. Mouth/Throat: Mucous membranes are moist.  Oropharynx non-erythematous. Neck: No stridor. No cervical spine tenderness to palpation. Hematological/Lymphatic/Immunilogical: No cervical lymphadenopathy. Cardiovascular: Normal rate, regular rhythm. Grossly normal heart sounds.    Elevated blood pressure. Respiratory: Normal respiratory effort.  No retractions. Lungs CTAB. Gastrointestinal: Soft and nontender. No distention. No abdominal bruits. No CVA tenderness. Genitourinary: Deferred Musculoskeletal: No lower extremity tenderness nor edema.  No joint effusions.  Neurologic:  Normal speech and language. No gross focal neurologic deficits are appreciated. No gait instability. Skin:  Skin is warm, dry and intact. No rash noted. Psychiatric: Mood and affect are normal. Speech and behavior are normal.  ____________________________________________   LABS (all labs ordered are listed, but only abnormal results are displayed)  Labs Reviewed  BLOOD CULTURE ID PANEL (REFLEXED) - Abnormal; Notable for the following components:      Result Value   Staphylococcus species DETECTED (*)    Methicillin resistance DETECTED (*)    All other components within normal limits  COMPREHENSIVE METABOLIC PANEL - Abnormal; Notable for the following components:   Potassium 3.3 (*)    Glucose, Bld 136 (*)    BUN 26 (*)    Creatinine, Ser 2.10 (*)    Albumin 3.1 (*)    AST 60 (*)    GFR calc non Af Amer 32 (*)    GFR calc Af Amer 37 (*)    All other components within normal limits  CBC WITH DIFFERENTIAL/PLATELET - Abnormal; Notable for the following components:   WBC 12.8 (*)    RBC 2.81 (*)    Hemoglobin 8.6 (*)    HCT 26.1 (*)    Platelets 103 (*)    Neutro Abs 11.6 (*)    Lymphs Abs 0.3 (*)    Abs Immature Granulocytes 0.11 (*)    All other components within normal limits  FIBRIN DERIVATIVES D-DIMER ([facility] ONLY) - Abnormal; Notable for the following components:   Fibrin derivatives D-dimer ([facility]) 1,119.75 (*)    All other components within normal limits  LACTATE DEHYDROGENASE - Abnormal; Notable for the following components:   LDH 195 (*)    All other components within normal limits  C-REACTIVE PROTEIN - Abnormal; Notable for the following components:   CRP 1.3 (*)    All other components within normal limits  URINALYSIS, COMPLETE (UACMP) WITH MICROSCOPIC - Abnormal; Notable for the following components:   Color, Urine YELLOW (*)    APPearance HAZY (*)    Hgb urine dipstick SMALL (*)    Protein, ur 100 (*)    All other components within normal limits   SARS CORONAVIRUS 2 (HOSPITAL ORDER, [place] LAB)  CULTURE, BLOOD (ROUTINE X 2)  CULTURE, BLOOD (ROUTINE X 2)  LACTIC ACID, PLASMA  LIPASE, BLOOD  PROCALCITONIN  FERRITIN   ____________________________________________  EKG   ____________________________________________  RADIOLOGY  ED MD interpretation: No acute findings on chest x-ray  Official radiology report(s): No results found.  ____________________________________________   PROCEDURES  Procedure(s) performed (including Critical Care):  Procedures  ____________________________________________   INITIAL IMPRESSION / ASSESSMENT AND PLAN / ED COURSE  As part of my medical decision making, I reviewed the following data within the [place]         Chronically ill patient with fever and emesis today.  Given patient past medical history will be evaluated in the context of global COVID-19 pandemic. ____________________________________________   FINAL CLINICAL IMPRESSION(S) / ED DIAGNOSES  Final diagnoses:  Vomiting, intractability of vomiting not specified, presence of nausea not specified, unspecified vomiting type  Fever, unspecified fever cause     ED Discharge Orders    None       Note:  This document was prepared using Dragon voice recognition software and may include unintentional dictation errors.    Sable Feil, PA-C 03/16/19 1542    Nance Pear, MD 03/16/19 [phone]

## 2019-03-15 ENCOUNTER — Emergency Department
Admission: EM | Admit: 2019-03-15 | Discharge: 2019-03-15 | Disposition: A | Discharge status: Home / Self Care | Payer: Medicare Other | Source: Home / Self Care | Attending: Emergency Medicine | Admitting: Emergency Medicine

## 2019-03-15 ENCOUNTER — Encounter: Payer: Self-pay | Admitting: Intensive Care

## 2019-03-15 ENCOUNTER — Other Ambulatory Visit: Payer: Self-pay

## 2019-03-15 DIAGNOSIS — N189 Chronic kidney disease, unspecified: Secondary | ICD-10-CM | POA: Insufficient documentation

## 2019-03-15 DIAGNOSIS — I129 Hypertensive chronic kidney disease with stage 1 through stage 4 chronic kidney disease, or unspecified chronic kidney disease: Secondary | ICD-10-CM | POA: Insufficient documentation

## 2019-03-15 DIAGNOSIS — R7881 Bacteremia: Secondary | ICD-10-CM

## 2019-03-15 DIAGNOSIS — Z79899 Other long term (current) drug therapy: Secondary | ICD-10-CM | POA: Insufficient documentation

## 2019-03-15 DIAGNOSIS — E785 Hyperlipidemia, unspecified: Secondary | ICD-10-CM | POA: Insufficient documentation

## 2019-03-15 DIAGNOSIS — R1110 Vomiting, unspecified: Secondary | ICD-10-CM | POA: Diagnosis not present

## 2019-03-15 LAB — BLOOD CULTURE ID PANEL (REFLEXED)

## 2019-03-15 LAB — C-REACTIVE PROTEIN: CRP: 1.3 mg/dL — ABNORMAL HIGH (ref <1.0)

## 2019-03-15 NOTE — Discharge Instructions (Addendum)
Please seek medical attention for any high fevers, chest pain, shortness of breath, change in behavior, persistent vomiting, bloody stool or any other new or concerning symptoms.  

## 2019-03-15 NOTE — ED Notes (Signed)
Lab results received about positive blood cultures, dr Ellender Hose aware and this RN called pt to return to ER for further treatment [phone]

## 2019-03-15 NOTE — ED Provider Notes (Signed)
[facility] Emergency Department Provider Note   ____________________________________________   I have reviewed the triage vital signs and the nursing notes.   HISTORY  Chief Complaint Abnormal Lab   History limited by: Not Limited   HPI Corey West is a 68 y.o. male who presents to the emergency department today after being called about a positive blood culture that was drawn yesterday. The patient states that he has been feeling fine today. Denies any fevers, nausea, vomiting or discomfort.   Records reviewed. Per medical record review patient has a history of ER visit yesterday for fever and emesis.   Past Medical History:  Diagnosis Date  . Anemia   . Anxiety    controlled;   . Arthritis   . AVM (arteriovenous malformation) of stomach, acquired with hemorrhage   . Barrett's esophagus   . Chronic kidney disease    renal infufficiency  . Cirrhosis ([place])   . Depression    controlled;   Marland Kitchen Diabetes mellitus without complication ([place])    not controlled, taking insulin but sugar continues to run high;   . Edema   . Esophageal varices ([place])   . GAVE (gastric antral vascular ectasia)   . GERD (gastroesophageal reflux disease)   . History of hiatal hernia   . Hyperlipidemia   . Hypertension    controlled well;   . Liver cirrhosis ([place])   . 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 Obesity   . Pancytopenia ([place])   . Polyp, stomach    with chronic blood loss  . Sleep apnea    does not wear a cpap, Medicare would not pay for it   . Venous stasis dermatitis of both lower extremities   . Venous stasis of both lower extremities    with cellulitis    Patient Active Problem List   Diagnosis Date Noted  . HCAP (healthcare-associated pneumonia) 02/03/2019  . Aphasia 01/14/2019  . Acute on chronic renal failure ([place]) 01/14/2019  . Malnutrition of moderate degree 12/03/2018  . C. difficile diarrhea 12/02/2018  . Ankle fracture, left, closed, initial  encounter 10/15/2018  . Ankle fracture, right, closed, initial encounter 10/15/2018  . Ankle fracture 10/15/2018  . Pressure injury of skin 07/19/2018  . UTI (urinary tract infection) 07/18/2018  . CKD (chronic kidney disease) stage 3, GFR 30-59 ml/min (HCC) 01/02/2018  . 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 Goals of care, counseling/discussion   . Palliative care encounter   . Hepatic encephalopathy syndrome ([place]) 12/16/2017  . Hepatic encephalopathy ([place]) 10/18/2017  . Hypokalemia 10/09/2017  . Hyperglycemia 10/09/2017  . Symptomatic anemia 06/26/2017  . Sepsis ([place]) 06/12/2017  . Type 2 diabetes mellitus with diabetic nephropathy, with long-term current use of insulin ([place]) 05/29/2017  . Cellulitis in diabetic foot ([place]) 02/21/2017  . Cellulitis 02/21/2017  . GI bleed 07/20/2015  . Hematemesis 02/08/2015  . Cirrhosis ([place]) 02/08/2015  . Esophageal varices ([place]) 02/08/2015  . Gastroesophageal reflux disease 02/08/2015  . Hyperlipidemia 02/08/2015  . Chronic venous stasis dermatitis of both lower extremities 10/06/2014  . Edema 05/25/2014  . Hypertension 05/25/2014  . Obesity 05/25/2014  . Sleep apnea 05/25/2014  . Barrett's esophagus 05/25/2014  . Pancytopenia ([place]) 05/25/2014  . Iron deficiency anemia 12/04/2013  . Thrombocytopenia ([place]) 12/04/2013  . Splenomegaly 11/04/2013    Past Surgical History:  Procedure Laterality Date  . ESOPHAGOGASTRODUODENOSCOPY N/A 02/09/2015   Procedure: ESOPHAGOGASTRODUODENOSCOPY (EGD);  Surgeon: Manya Silvas, MD;  Location: [facility] ENDOSCOPY;  Service:  Endoscopy;  Laterality: N/A;  . ESOPHAGOGASTRODUODENOSCOPY N/A 07/22/2015   Procedure: ESOPHAGOGASTRODUODENOSCOPY (EGD);  Surgeon: Manya Silvas, MD;  Location: [facility] ENDOSCOPY;  Service: Endoscopy;  Laterality: N/A;  . ESOPHAGOGASTRODUODENOSCOPY N/A 06/26/2017   Procedure: ESOPHAGOGASTRODUODENOSCOPY (EGD);  Surgeon: Manya Silvas, MD;  Location: [facility] ENDOSCOPY;   Service: Endoscopy;  Laterality: N/A;  . ESOPHAGOGASTRODUODENOSCOPY N/A 06/27/2017   Procedure: ESOPHAGOGASTRODUODENOSCOPY (EGD);  Surgeon: Manya Silvas, MD;  Location: [facility] ENDOSCOPY;  Service: Endoscopy;  Laterality: N/A;  . ESOPHAGOGASTRODUODENOSCOPY N/A 06/28/2017   Procedure: ESOPHAGOGASTRODUODENOSCOPY (EGD);  Surgeon: Manya Silvas, MD;  Location: [facility] ENDOSCOPY;  Service: Endoscopy;  Laterality: N/A;  . ESOPHAGOGASTRODUODENOSCOPY Bilateral 10/11/2017   Procedure: ESOPHAGOGASTRODUODENOSCOPY (EGD);  Surgeon: Manya Silvas, MD;  Location: [facility] ENDOSCOPY;  Service: Endoscopy;  Laterality: Bilateral;  . ESOPHAGOGASTRODUODENOSCOPY N/A 12/04/2017   Procedure: ESOPHAGOGASTRODUODENOSCOPY (EGD);  Surgeon: Manya Silvas, MD;  Location: [facility] ENDOSCOPY;  Service: Endoscopy;  Laterality: N/A;  . ESOPHAGOGASTRODUODENOSCOPY N/A 03/06/2019   Procedure: ESOPHAGOGASTRODUODENOSCOPY (EGD);  Surgeon: [place], Benay Pike, MD;  Location: [facility] ENDOSCOPY;  Service: Gastroenterology;  Laterality: N/A;  . ESOPHAGOGASTRODUODENOSCOPY (EGD) WITH PROPOFOL N/A 07/20/2015   Procedure: ESOPHAGOGASTRODUODENOSCOPY (EGD) WITH PROPOFOL;  Surgeon: Manya Silvas, MD;  Location: [facility] ENDOSCOPY;  Service: Endoscopy;  Laterality: N/A;  . ESOPHAGOGASTRODUODENOSCOPY (EGD) WITH PROPOFOL N/A 09/16/2015   Procedure: ESOPHAGOGASTRODUODENOSCOPY (EGD) WITH PROPOFOL;  Surgeon: Manya Silvas, MD;  Location: [facility] ENDOSCOPY;  Service: Endoscopy;  Laterality: N/A;  . ESOPHAGOGASTRODUODENOSCOPY (EGD) WITH PROPOFOL N/A 03/16/2016   Procedure: ESOPHAGOGASTRODUODENOSCOPY (EGD) WITH PROPOFOL;  Surgeon: Manya Silvas, MD;  Location: [facility] ENDOSCOPY;  Service: Endoscopy;  Laterality: N/A;  . ESOPHAGOGASTRODUODENOSCOPY (EGD) WITH PROPOFOL N/A 09/14/2016   Procedure: ESOPHAGOGASTRODUODENOSCOPY (EGD) WITH PROPOFOL;  Surgeon: Manya Silvas, MD;  Location: [facility] ENDOSCOPY;  Service: Endoscopy;  Laterality: N/A;  . ESOPHAGOGASTRODUODENOSCOPY  (EGD) WITH PROPOFOL N/A 11/05/2016   Procedure: ESOPHAGOGASTRODUODENOSCOPY (EGD) WITH PROPOFOL;  Surgeon: Manya Silvas, MD;  Location: [facility] ENDOSCOPY;  Service: Endoscopy;  Laterality: N/A;  . ESOPHAGOGASTRODUODENOSCOPY (EGD) WITH PROPOFOL N/A 02/06/2017   Procedure: ESOPHAGOGASTRODUODENOSCOPY (EGD) WITH PROPOFOL;  Surgeon: Manya Silvas, MD;  Location: [facility] ENDOSCOPY;  Service: Endoscopy;  Laterality: N/A;  . ESOPHAGOGASTRODUODENOSCOPY (EGD) WITH PROPOFOL N/A 04/24/2017   Procedure: ESOPHAGOGASTRODUODENOSCOPY (EGD) WITH PROPOFOL;  Surgeon: Manya Silvas, MD;  Location: [facility] ENDOSCOPY;  Service: Endoscopy;  Laterality: N/A;  . ESOPHAGOGASTRODUODENOSCOPY (EGD) WITH PROPOFOL N/A 07/31/2017   Procedure: ESOPHAGOGASTRODUODENOSCOPY (EGD) WITH PROPOFOL;  Surgeon: Manya Silvas, MD;  Location: [facility] ENDOSCOPY;  Service: Endoscopy;  Laterality: N/A;  . ESOPHAGOGASTRODUODENOSCOPY (EGD) WITH PROPOFOL N/A 08/08/2018   Procedure: ESOPHAGOGASTRODUODENOSCOPY (EGD) WITH PROPOFOL;  Surgeon: Manya Silvas, MD;  Location: [facility] ENDOSCOPY;  Service: Endoscopy;  Laterality: N/A;  . GIVENS CAPSULE STUDY N/A 12/05/2017   Procedure: GIVENS CAPSULE STUDY;  Surgeon: Manya Silvas, MD;  Location: [facility] ENDOSCOPY;  Service: Endoscopy;  Laterality: N/A;  . TONSILLECTOMY    . TONSILLECTOMY AND ADENOIDECTOMY    . ULNAR NERVE TRANSPOSITION    . UVULOPALATOPHARYNGOPLASTY      Prior to Admission medications   Medication Sig Start Date End Date Taking? Authorizing Provider  atorvastatin (LIPITOR) 40 MG tablet Take 1 tablet (40 mg total) by mouth daily at 6 PM. 02/05/19   Gouru, Aruna, MD  cetirizine (ZYRTEC) 10 MG tablet Take 10 mg by mouth daily.    [provider]  ferrous sulfate 325 (65 FE) MG tablet Take 325 mg by mouth 2 (two) times daily with a meal.  [provider]  furosemide (LASIX) 40 MG tablet Take 40 mg by mouth 2 (two) times a day.  01/13/19   [provider]  insulin  aspart protamine - aspart (NOVOLOG 70/30 MIX) (70-30) 100 UNIT/ML FlexPen Inject 70-84 Units into the skin See admin instructions. Inject 84u under the skin every morning and 70u under the skin every evening    [provider]  lactulose (CHRONULAC) 10 GM/15ML solution Take 30 g by mouth 4 (four) times daily.  12/11/18   [provider]  LORazepam (ATIVAN) 0.5 MG tablet Take 1 tablet (0.5 mg total) by mouth every 8 (eight) hours as needed for anxiety. 10/18/18   Gladstone Lighter, MD  metolazone (ZAROXOLYN) 5 MG tablet Take 5 mg by mouth daily.    [provider]  Multiple Vitamin (MULTIVITAMIN) tablet Take 1 tablet by mouth daily.    [provider]  omeprazole (PRILOSEC) 40 MG capsule Take 40 mg by mouth daily.    [provider]  pantoprazole (PROTONIX) 40 MG tablet Take 1 tablet (40 mg total) by mouth 2 (two) times daily. 03/06/19 03/05/20  Dustin Flock, MD  Potassium Chloride ER 20 MEQ TBCR Take 20 mEq by mouth 2 (two) times daily. While on lasix' Patient taking differently: Take 20 mEq by mouth 2 (two) times daily.  10/18/18   Gladstone Lighter, MD  rifaximin (XIFAXAN) 550 MG TABS tablet Take 1 tablet (550 mg total) by mouth 2 (two) times daily. 03/06/19   Dustin Flock, MD  traMADol (ULTRAM) 50 MG tablet Take 50 mg by mouth 3 (three) times daily.  12/24/18   [provider]  vitamin C (ASCORBIC ACID) 250 MG tablet Take 500 mg by mouth 2 (two) times daily.    [provider]  zinc sulfate 220 (50 Zn) MG capsule Take 220 mg by mouth daily.    [provider]    Allergies Patient has no known allergies.  Family History  Problem Relation Age of Onset  . Diabetes Other   . Transient ischemic attack Father   . CAD Father     Social History Social History   Tobacco Use  . Smoking status: Former Smoker    Years: 20.00    Types: Cigars  . Smokeless tobacco: Never Used  Substance Use Topics  . Alcohol use: No     Comment: stopped 15 years ago  . Drug use: No    Review of Systems Constitutional: No fever/chills Eyes: No visual changes. ENT: No sore throat. Cardiovascular: Denies chest pain. Respiratory: Denies shortness of breath. Gastrointestinal: No abdominal pain.  No nausea, no vomiting.  No diarrhea.   Genitourinary: Negative for dysuria. Musculoskeletal: Lower leg swelling.  Skin: Skin changes to lower leg.  Neurological: Negative for headaches, focal weakness or numbness.  ____________________________________________   PHYSICAL EXAM:  VITAL SIGNS: ED Triage Vitals  Enc Vitals Group     BP 03/15/19 1541 (!) 139/58     Pulse Rate 03/15/19 1541 81     Resp 03/15/19 1541 18     Temp 03/15/19 1541 98.4 F (36.9 C)     Temp Source 03/15/19 1541 Oral     SpO2 03/15/19 1541 95 %     Weight 03/15/19 1542 285 lb (129.3 kg)     Height 03/15/19 1542 5\' 10"  (1.778 m)     Head Circumference --      Peak Flow --      Pain Score 03/15/19 1542 0   Constitutional:  Alert and oriented.  Eyes: Conjunctivae are normal.  ENT      Head: Normocephalic and atraumatic.      Nose: No congestion/rhinnorhea.      Mouth/Throat: Mucous membranes are moist.      Neck: No stridor. Cardiovascular: Normal rate, regular rhythm.  No murmurs, rubs, or gallops.  Respiratory: Normal respiratory effort without tachypnea nor retractions. Breath sounds are clear and equal bilaterally. No wheezes/rales/rhonchi. Gastrointestinal: Soft and non tender. No rebound. No guarding.  Genitourinary: Deferred Musculoskeletal: Bilateral lower extremity edema.  Neurologic:  Normal speech and language. No gross focal neurologic deficits are appreciated.  Skin:  Skin is warm, dry and intact. No rash noted. Psychiatric: Mood and affect are normal. Speech and behavior are normal. Patient exhibits appropriate insight and judgment.  ____________________________________________    LABS (pertinent  positives/negatives)  None  ____________________________________________   EKG  None  ____________________________________________    RADIOLOGY  None  ____________________________________________   PROCEDURES  Procedures  ____________________________________________   INITIAL IMPRESSION / ASSESSMENT AND PLAN / ED COURSE  Pertinent labs & imaging results that were available during my care of the patient were reviewed by me and considered in my medical decision making (see chart for details).   Patient called back to the emergency department today because of a positive blood culture that was drawn yesterday.  Does appear that only 1 of 4 bottles drew this blood culture and he has had this finding in the past.  At this point I think it is likely contaminant.  Patient does clinically appear well and denies any acute medical complaints.  Did offer and discussed drawing blood today.  He however declined stating he will follow-up with his doctor tomorrow for blood draw.  I think this is reasonable given that patient is asymptomatic.  ____________________________________________   FINAL CLINICAL IMPRESSION(S) / ED DIAGNOSES  Final diagnoses:  Blood bacterial culture positive     Note: This dictation was prepared with Dragon dictation. Any transcriptional errors that result from this process are unintentional     Nance Pear, MD 03/15/19 1715

## 2019-03-15 NOTE — ED Notes (Signed)
Explained to pt we will be drawing bloodwork to compare to yesterday's lab work since 1 out of 4 culture bottle was positive. Pt states he either wants to go to his room or just have his pcp draw the blood tomorrow.

## 2019-03-15 NOTE — ED Notes (Signed)
Spoke with lab, 1 out of 4 blood culture bottles were contaminated.

## 2019-03-15 NOTE — ED Triage Notes (Signed)
Patient called to return to ER for positive blood culture.

## 2019-03-16 ENCOUNTER — Ambulatory Visit: Admit: 2019-03-16 | Payer: Medicare Other | Admitting: Unknown Physician Specialty

## 2019-03-16 SURGERY — ESOPHAGOGASTRODUODENOSCOPY (EGD) WITH PROPOFOL
Anesthesia: General

## 2019-03-18 LAB — CULTURE, BLOOD (ROUTINE X 2)

## 2019-03-19 LAB — CULTURE, BLOOD (ROUTINE X 2): Culture: NO GROWTH

## 2019-04-17 ENCOUNTER — Emergency Department
Admission: EM | Admit: 2019-04-17 | Discharge: 2019-04-17 | Discharge status: Against Medical Advice | Payer: Medicare Other | Attending: Student in an Organized Health Care Education/Training Program | Admitting: Student in an Organized Health Care Education/Training Program

## 2019-04-17 ENCOUNTER — Emergency Department: Payer: Medicare Other

## 2019-04-17 DIAGNOSIS — Z5329 Procedure and treatment not carried out because of patient's decision for other reasons: Secondary | ICD-10-CM | POA: Diagnosis not present

## 2019-04-17 DIAGNOSIS — R410 Disorientation, unspecified: Secondary | ICD-10-CM | POA: Insufficient documentation

## 2019-04-17 DIAGNOSIS — Z794 Long term (current) use of insulin: Secondary | ICD-10-CM | POA: Insufficient documentation

## 2019-04-17 DIAGNOSIS — Z79899 Other long term (current) drug therapy: Secondary | ICD-10-CM | POA: Diagnosis not present

## 2019-04-17 DIAGNOSIS — N183 Chronic kidney disease, stage 3 (moderate): Secondary | ICD-10-CM | POA: Diagnosis not present

## 2019-04-17 DIAGNOSIS — I129 Hypertensive chronic kidney disease with stage 1 through stage 4 chronic kidney disease, or unspecified chronic kidney disease: Secondary | ICD-10-CM | POA: Diagnosis not present

## 2019-04-17 DIAGNOSIS — Z87891 Personal history of nicotine dependence: Secondary | ICD-10-CM | POA: Insufficient documentation

## 2019-04-17 DIAGNOSIS — R531 Weakness: Secondary | ICD-10-CM | POA: Diagnosis not present

## 2019-04-17 DIAGNOSIS — E1122 Type 2 diabetes mellitus with diabetic chronic kidney disease: Secondary | ICD-10-CM | POA: Insufficient documentation

## 2019-04-17 LAB — GLUCOSE, CAPILLARY: Glucose-Capillary: 270 mg/dL — ABNORMAL HIGH (ref 70–99)

## 2019-04-17 LAB — CBC
HCT: 23.3 % — ABNORMAL LOW (ref 39.0–52.0)
Hemoglobin: 7.8 g/dL — ABNORMAL LOW (ref 13.0–17.0)
MCH: 30.2 pg (ref 26.0–34.0)
MCHC: 33.5 g/dL (ref 30.0–36.0)
MCV: 90.3 fL (ref 80.0–100.0)
Platelets: 97 10*3/uL — ABNORMAL LOW (ref 150–400)
RBC: 2.58 MIL/uL — ABNORMAL LOW (ref 4.22–5.81)
RDW: 14.7 % (ref 11.5–15.5)
WBC: 5.1 10*3/uL (ref 4.0–10.5)
nRBC: 0 % (ref 0.0–0.2)

## 2019-04-17 LAB — COMPREHENSIVE METABOLIC PANEL
ALT: 22 U/L (ref 0–44)
AST: 31 U/L (ref 15–41)
Albumin: 3 g/dL — ABNORMAL LOW (ref 3.5–5.0)
Alkaline Phosphatase: 82 U/L (ref 38–126)
Anion gap: 9 (ref 5–15)
BUN: 33 mg/dL — ABNORMAL HIGH (ref 8–23)
CO2: 26 mmol/L (ref 22–32)
Calcium: 8.9 mg/dL (ref 8.9–10.3)
Chloride: 102 mmol/L (ref 98–111)
Creatinine, Ser: 2.38 mg/dL — ABNORMAL HIGH (ref 0.61–1.24)
GFR calc Af Amer: 31 mL/min — ABNORMAL LOW (ref >60)
GFR calc non Af Amer: 27 mL/min — ABNORMAL LOW (ref >60)
Glucose, Bld: 292 mg/dL — ABNORMAL HIGH (ref 70–99)
Potassium: 3.6 mmol/L (ref 3.5–5.1)
Sodium: 137 mmol/L (ref 135–145)
Total Bilirubin: 0.7 mg/dL (ref 0.3–1.2)
Total Protein: 6.7 g/dL (ref 6.5–8.1)

## 2019-04-17 LAB — AMMONIA: Ammonia: 54 umol/L — ABNORMAL HIGH (ref 9–35)

## 2019-04-17 MED ORDER — SODIUM CHLORIDE 0.9% FLUSH: 3.0000 mL | Freq: Once | INTRAVENOUS | Status: DC

## 2019-04-17 MED ORDER — SODIUM CHLORIDE 0.9 % IV BOLUS: 250.0000 mL | Freq: Once | INTRAVENOUS | Status: DC

## 2019-04-17 MED ORDER — RIFAXIMIN 550 MG PO TABS
550.0000 mg | ORAL_TABLET | Freq: Two times a day (BID) | ORAL | Status: DC
  Filled 2019-04-17: qty 1

## 2019-04-17 MED ORDER — LACTULOSE 10 GM/15ML PO SOLN
30.0000 g | Freq: Once | ORAL | Status: DC
  Filled 2019-04-17: qty 60

## 2019-04-17 MED ORDER — LACTULOSE 10 GM/15ML PO SOLN: 30.0000 g | Freq: Once | ORAL | Status: DC

## 2019-04-17 NOTE — ED Notes (Signed)
Pt given water to drink. 

## 2019-04-17 NOTE — ED Notes (Signed)
Pt transported to xray 

## 2019-04-17 NOTE — ED Notes (Signed)
Called lab to check on status of CBC

## 2019-04-17 NOTE — ED Notes (Signed)
RN in room to give pt lactulose. Pt stated that he did not want to take the medication because he was worried he would use the bathroom on himself. RN informed him that Dr. Quentin Cornwall wanted him to take the medication so that he did not get worse while he was here. Pt request to speak with MD before taking the medication.  MD at bedside, pt stated that he wanted to take the medication at home like they have previously discussed. MD informed pt that he would like him to take the medication now and be monitored for a total of 2 hours, which would be another 2 hours. Pt stated that he needed to be home by 10 am because he had an appt. Pt stated that he wanted to just sign out because he needed to be home.

## 2019-04-17 NOTE — ED Notes (Signed)
Pt ambulated to bathroom using walker without difficulty.

## 2019-04-17 NOTE — ED Provider Notes (Signed)
[facility] Emergency Department Provider Note    First MD Initiated Contact with Patient 04/17/19 [phone]     (approximate)  I have reviewed the triage vital signs and the nursing notes.   HISTORY  Chief Complaint Altered Mental Status    HPI Corey West is a 68 y.o. male below listed past medical history presents the ER for evaluation of confusion and "feeling off ".  This occurred in the middle the night and this morning.  States that last night he felt he was getting dehydrated and a little bit more anxious.  He did not drink any extra fluids but took 2 of his "anxiety pills ".  Started feeling weak and foggy after that. No lateralizing weakness.  States he felt more "disoriented" when he first got out of bed feeling like his furniture was moved around.   Denies any pain.  No falls.  He drove himself to the ER this morning.  States he started feeling better prior to driving the ER as the symptoms had resolved. States that this has happened many times.  "  He is already requesting discharge home.    Past Medical History:  Diagnosis Date   Anemia    Anxiety    controlled;    Arthritis    AVM (arteriovenous malformation) of stomach, acquired with hemorrhage    Barrett's esophagus    Chronic kidney disease    renal infufficiency   Cirrhosis (HCC)    Depression    controlled;    Diabetes mellitus without complication (HCC)    not controlled, taking insulin but sugar continues to run high;    Edema    Esophageal varices (HCC)    GAVE (gastric antral vascular ectasia)    GERD (gastroesophageal reflux disease)    History of hiatal hernia    Hyperlipidemia    Hypertension    controlled well;    Liver cirrhosis (HCC)    Nephropathy, diabetic ([place])    Obesity    Pancytopenia (HCC)    Polyp, stomach    with chronic blood loss   Sleep apnea    does not wear a cpap, Medicare would not pay for it    Venous stasis dermatitis of  both lower extremities    Venous stasis of both lower extremities    with cellulitis   Family History  Problem Relation Age of Onset   Diabetes Other    Transient ischemic attack Father    CAD Father    Past Surgical History:  Procedure Laterality Date   ESOPHAGOGASTRODUODENOSCOPY N/A 02/09/2015   Procedure: ESOPHAGOGASTRODUODENOSCOPY (EGD);  Surgeon: Manya Silvas, MD;  Location: [facility] ENDOSCOPY;  Service: Endoscopy;  Laterality: N/A;   ESOPHAGOGASTRODUODENOSCOPY N/A 07/22/2015   Procedure: ESOPHAGOGASTRODUODENOSCOPY (EGD);  Surgeon: Manya Silvas, MD;  Location: [facility] ENDOSCOPY;  Service: Endoscopy;  Laterality: N/A;   ESOPHAGOGASTRODUODENOSCOPY N/A 06/26/2017   Procedure: ESOPHAGOGASTRODUODENOSCOPY (EGD);  Surgeon: Manya Silvas, MD;  Location: [facility] ENDOSCOPY;  Service: Endoscopy;  Laterality: N/A;   ESOPHAGOGASTRODUODENOSCOPY N/A 06/27/2017   Procedure: ESOPHAGOGASTRODUODENOSCOPY (EGD);  Surgeon: Manya Silvas, MD;  Location: [facility] ENDOSCOPY;  Service: Endoscopy;  Laterality: N/A;   ESOPHAGOGASTRODUODENOSCOPY N/A 06/28/2017   Procedure: ESOPHAGOGASTRODUODENOSCOPY (EGD);  Surgeon: Manya Silvas, MD;  Location: [facility] ENDOSCOPY;  Service: Endoscopy;  Laterality: N/A;   ESOPHAGOGASTRODUODENOSCOPY Bilateral 10/11/2017   Procedure: ESOPHAGOGASTRODUODENOSCOPY (EGD);  Surgeon: Manya Silvas, MD;  Location: [facility] ENDOSCOPY;  Service: Endoscopy;  Laterality: Bilateral;   ESOPHAGOGASTRODUODENOSCOPY N/A 12/04/2017  Procedure: ESOPHAGOGASTRODUODENOSCOPY (EGD);  Surgeon: Manya Silvas, MD;  Location: [facility] ENDOSCOPY;  Service: Endoscopy;  Laterality: N/A;   ESOPHAGOGASTRODUODENOSCOPY N/A 03/06/2019   Procedure: ESOPHAGOGASTRODUODENOSCOPY (EGD);  Surgeon: Tol[place], Benay Pike, MD;  Location: [facility] ENDOSCOPY;  Service: Gastroenterology;  Laterality: N/A;   ESOPHAGOGASTRODUODENOSCOPY (EGD) WITH PROPOFOL N/A 07/20/2015   Procedure: ESOPHAGOGASTRODUODENOSCOPY (EGD) WITH PROPOFOL;   Surgeon: Manya Silvas, MD;  Location: [facility] ENDOSCOPY;  Service: Endoscopy;  Laterality: N/A;   ESOPHAGOGASTRODUODENOSCOPY (EGD) WITH PROPOFOL N/A 09/16/2015   Procedure: ESOPHAGOGASTRODUODENOSCOPY (EGD) WITH PROPOFOL;  Surgeon: Manya Silvas, MD;  Location: [facility] ENDOSCOPY;  Service: Endoscopy;  Laterality: N/A;   ESOPHAGOGASTRODUODENOSCOPY (EGD) WITH PROPOFOL N/A 03/16/2016   Procedure: ESOPHAGOGASTRODUODENOSCOPY (EGD) WITH PROPOFOL;  Surgeon: Manya Silvas, MD;  Location: [facility] ENDOSCOPY;  Service: Endoscopy;  Laterality: N/A;   ESOPHAGOGASTRODUODENOSCOPY (EGD) WITH PROPOFOL N/A 09/14/2016   Procedure: ESOPHAGOGASTRODUODENOSCOPY (EGD) WITH PROPOFOL;  Surgeon: Manya Silvas, MD;  Location: [facility] ENDOSCOPY;  Service: Endoscopy;  Laterality: N/A;   ESOPHAGOGASTRODUODENOSCOPY (EGD) WITH PROPOFOL N/A 11/05/2016   Procedure: ESOPHAGOGASTRODUODENOSCOPY (EGD) WITH PROPOFOL;  Surgeon: Manya Silvas, MD;  Location: [facility] ENDOSCOPY;  Service: Endoscopy;  Laterality: N/A;   ESOPHAGOGASTRODUODENOSCOPY (EGD) WITH PROPOFOL N/A 02/06/2017   Procedure: ESOPHAGOGASTRODUODENOSCOPY (EGD) WITH PROPOFOL;  Surgeon: Manya Silvas, MD;  Location: [facility] ENDOSCOPY;  Service: Endoscopy;  Laterality: N/A;   ESOPHAGOGASTRODUODENOSCOPY (EGD) WITH PROPOFOL N/A 04/24/2017   Procedure: ESOPHAGOGASTRODUODENOSCOPY (EGD) WITH PROPOFOL;  Surgeon: Manya Silvas, MD;  Location: [facility] ENDOSCOPY;  Service: Endoscopy;  Laterality: N/A;   ESOPHAGOGASTRODUODENOSCOPY (EGD) WITH PROPOFOL N/A 07/31/2017   Procedure: ESOPHAGOGASTRODUODENOSCOPY (EGD) WITH PROPOFOL;  Surgeon: Manya Silvas, MD;  Location: [facility] ENDOSCOPY;  Service: Endoscopy;  Laterality: N/A;   ESOPHAGOGASTRODUODENOSCOPY (EGD) WITH PROPOFOL N/A 08/08/2018   Procedure: ESOPHAGOGASTRODUODENOSCOPY (EGD) WITH PROPOFOL;  Surgeon: Manya Silvas, MD;  Location: [facility] ENDOSCOPY;  Service: Endoscopy;  Laterality: N/A;   GIVENS CAPSULE STUDY N/A 12/05/2017    Procedure: GIVENS CAPSULE STUDY;  Surgeon: Manya Silvas, MD;  Location: [facility] ENDOSCOPY;  Service: Endoscopy;  Laterality: N/A;   TONSILLECTOMY     TONSILLECTOMY AND ADENOIDECTOMY     ULNAR NERVE TRANSPOSITION     UVULOPALATOPHARYNGOPLASTY     Patient Active Problem List   Diagnosis Date Noted   HCAP (healthcare-associated pneumonia) 02/03/2019   Aphasia 01/14/2019   Acute on chronic renal failure ([place]) 01/14/2019   Malnutrition of moderate degree 12/03/2018   C. difficile diarrhea 12/02/2018   Ankle fracture, left, closed, initial encounter 10/15/2018   Ankle fracture, right, closed, initial encounter 10/15/2018   Ankle fracture 10/15/2018   Pressure injury of skin 07/19/2018   UTI (urinary tract infection) 07/18/2018   CKD (chronic kidney disease) stage 3, GFR 30-59 ml/min (HCC) 01/02/2018   GAVE (gastric antral vascular ectasia) 01/02/2018   Goals of care, counseling/discussion    Palliative care encounter    Hepatic encephalopathy syndrome ([place]) 12/16/2017   Hepatic encephalopathy ([place]) 10/18/2017   Hypokalemia 10/09/2017   Hyperglycemia 10/09/2017   Symptomatic anemia 06/26/2017   Sepsis ([place]) 06/12/2017   Type 2 diabetes mellitus with diabetic nephropathy, with long-term current use of insulin ([place]) 05/29/2017   Cellulitis in diabetic foot ([place]) 02/21/2017   Cellulitis 02/21/2017   GI bleed 07/20/2015   Hematemesis 02/08/2015   Cirrhosis ([place]) 02/08/2015   Esophageal varices ([place]) 02/08/2015   Gastroesophageal reflux disease 02/08/2015   Hyperlipidemia 02/08/2015   Chronic venous stasis dermatitis of both lower extremities 10/06/2014   Edema 05/25/2014  Hypertension 05/25/2014   Obesity 05/25/2014   Sleep apnea 05/25/2014   Barrett's esophagus 05/25/2014   Pancytopenia ([place]) 05/25/2014   Iron deficiency anemia 12/04/2013   Thrombocytopenia ([place]) 12/04/2013   Splenomegaly 11/04/2013      Prior to Admission  medications   Medication Sig Start Date End Date Taking? Authorizing Provider  atorvastatin (LIPITOR) 40 MG tablet Take 1 tablet (40 mg total) by mouth daily at 6 PM. 02/05/19   Gouru, Aruna, MD  cetirizine (ZYRTEC) 10 MG tablet Take 10 mg by mouth daily.    [provider]  ferrous sulfate 325 (65 FE) MG tablet Take 325 mg by mouth 2 (two) times daily with a meal.     [provider]  furosemide (LASIX) 40 MG tablet Take 40 mg by mouth 2 (two) times a day.  01/13/19   [provider]  insulin aspart protamine - aspart (NOVOLOG 70/30 MIX) (70-30) 100 UNIT/ML FlexPen Inject 70-84 Units into the skin See admin instructions. Inject 84u under the skin every morning and 70u under the skin every evening    [provider]  lactulose (CHRONULAC) 10 GM/15ML solution Take 30 g by mouth 4 (four) times daily.  12/11/18   [provider]  LORazepam (ATIVAN) 0.5 MG tablet Take 1 tablet (0.5 mg total) by mouth every 8 (eight) hours as needed for anxiety. 10/18/18   Gladstone Lighter, MD  metolazone (ZAROXOLYN) 5 MG tablet Take 5 mg by mouth daily.    [provider]  Multiple Vitamin (MULTIVITAMIN) tablet Take 1 tablet by mouth daily.    [provider]  omeprazole (PRILOSEC) 40 MG capsule Take 40 mg by mouth daily.    [provider]  pantoprazole (PROTONIX) 40 MG tablet Take 1 tablet (40 mg total) by mouth 2 (two) times daily. 03/06/19 03/05/20  Dustin Flock, MD  Potassium Chloride ER 20 MEQ TBCR Take 20 mEq by mouth 2 (two) times daily. While on lasix' Patient taking differently: Take 20 mEq by mouth 2 (two) times daily.  10/18/18   Gladstone Lighter, MD  rifaximin (XIFAXAN) 550 MG TABS tablet Take 1 tablet (550 mg total) by mouth 2 (two) times daily. 03/06/19   Dustin Flock, MD  traMADol (ULTRAM) 50 MG tablet Take 50 mg by mouth 3 (three) times daily.  12/24/18   [provider]  vitamin C (ASCORBIC ACID) 250 MG tablet Take 500 mg by  mouth 2 (two) times daily.    [provider]  zinc sulfate 220 (50 Zn) MG capsule Take 220 mg by mouth daily.    [provider]    Allergies Patient has no known allergies.    Social History Social History   Tobacco Use   Smoking status: Former Smoker    Years: 20.00    Types: Cigars   Smokeless tobacco: Never Used  Substance Use Topics   Alcohol use: No    Comment: stopped 15 years ago   Drug use: No    Review of Systems Patient denies headaches, rhinorrhea, blurry vision, numbness, shortness of breath, chest pain, edema, cough, abdominal pain, nausea, vomiting, diarrhea, dysuria, fevers, rashes or hallucinations unless otherwise stated above in HPI. ____________________________________________   PHYSICAL EXAM:  VITAL SIGNS: Vitals:   04/17/19 0700 04/17/19 0830  BP: (!) 155/54 (!) 151/55  Pulse: 77 77  Resp: (!) 21 (!) 21  Temp:    SpO2: 99% 100%    Constitutional: Alert and oriented x 3.    Eyes: Conjunctivae  are normal.  Head: Atraumatic. Nose: No congestion/rhinnorhea. Mouth/Throat: Mucous membranes are moist.   Neck: No stridor. Painless ROM.  Cardiovascular: Normal rate, regular rhythm. Grossly normal heart sounds.  Good peripheral circulation. Respiratory: Normal respiratory effort.  No retractions. Lungs CTAB. Gastrointestinal: Soft and nontender. No distention. No abdominal bruits. No CVA tenderness. Genitourinary:  Musculoskeletal: No lower extremity tenderness 2+ BLE edema..  No joint effusions. Neurologic:  Normal speech and language. No gross focal neurologic deficits are appreciated. No tremor. Very minimal asterixis. No facial droop Skin:  Skin is warm, dry and intact. No rash noted. Psychiatric: Speech and behavior are normal.  ____________________________________________   LABS (all labs ordered are listed, but only abnormal results are displayed)  Results for orders placed or performed during the hospital encounter  of 04/17/19 (from the past 24 hour(s))  Comprehensive metabolic panel     Status: Abnormal   Collection Time: 04/17/19  5:13 AM  Result Value Ref Range   Sodium 137 135 - 145 mmol/L   Potassium 3.6 3.5 - 5.1 mmol/L   Chloride 102 98 - 111 mmol/L   CO2 26 22 - 32 mmol/L   Glucose, Bld 292 (H) 70 - 99 mg/dL   BUN 33 (H) 8 - 23 mg/dL   Creatinine, Ser 2.38 (H) 0.61 - 1.24 mg/dL   Calcium 8.9 8.9 - 10.3 mg/dL   Total Protein 6.7 6.5 - 8.1 g/dL   Albumin 3.0 (L) 3.5 - 5.0 g/dL   AST 31 15 - 41 U/L   ALT 22 0 - 44 U/L   Alkaline Phosphatase 82 38 - 126 U/L   Total Bilirubin 0.7 0.3 - 1.2 mg/dL   GFR calc non Af Amer 27 (L) >60 mL/min   GFR calc Af Amer 31 (L) >60 mL/min   Anion gap 9 5 - 15  CBC     Status: Abnormal   Collection Time: 04/17/19  5:13 AM  Result Value Ref Range   WBC 5.1 4.0 - 10.5 K/uL   RBC 2.58 (L) 4.22 - 5.81 MIL/uL   Hemoglobin 7.8 (L) 13.0 - 17.0 g/dL   HCT 23.3 (L) 39.0 - 52.0 %   MCV 90.3 80.0 - 100.0 fL   MCH 30.2 26.0 - 34.0 pg   MCHC 33.5 30.0 - 36.0 g/dL   RDW 14.7 11.5 - 15.5 %   Platelets 97 (L) 150 - 400 K/uL   nRBC 0.0 0.0 - 0.2 %  Glucose, capillary     Status: Abnormal   Collection Time: 04/17/19  5:19 AM  Result Value Ref Range   Glucose-Capillary 270 (H) 70 - 99 mg/dL  Ammonia     Status: Abnormal   Collection Time: 04/17/19  6:25 AM  Result Value Ref Range   Ammonia 54 (H) 9 - 35 umol/L  Sample to Blood Bank     Status: None   Collection Time: 04/17/19  6:25 AM  Result Value Ref Range   Blood Bank Specimen      HEMOLYZED, CALLED TO ANGELA ROBBINS AT 0723 ON 04/17/2019 KLM   Sample Expiration      04/20/2019,2359 Performed at [facility], [address], [place], [place] [address]    ____________________________________________  EKG My review and personal interpretation at Time: 5:07   Indication: confusion  Rate: 80  Rhythm: sinus Axis: normal Other: no stemi, borderline prolonged  qt ____________________________________________  RADIOLOGY  I personally reviewed all radiographic images ordered to evaluate for the above acute complaints and reviewed  radiology reports and findings.  These findings were personally discussed with the patient.  Please see medical record for radiology report.  ____________________________________________   PROCEDURES  Procedure(s) performed:  Procedures    Critical Care performed: no ____________________________________________   INITIAL IMPRESSION / ASSESSMENT AND PLAN / ED COURSE  Pertinent labs & imaging results that were available during my care of the patient were reviewed by me and considered in my medical decision making (see chart for details).   DDX: Dehydration, sepsis, pna, uti, hypoglycemia, cva, drug effect, withdrawal, encephalitis   Corey West is a 68 y.o. who presents to the ED with past medical history as listed above presenting with confusion this morning.  Patient nontoxic-appearing pleasant and in no acute distress.  Does have some faint asterixis but seems overall oriented.  States that he did drive here which is a bit concerning given his chief complaint of confusion and taking anxiolytics which may have influenced his symptoms and ability to drive.  He has no focal neuro deficits or lateralizing features.  CT imaging was ordered out of triage due to above differential.  He has no meningismus.  No signs or symptoms of sepsis or infection.  His abdominal exam is soft and benign denies any chest pain or shortness of breath.  Patient adamant about being released immediately.  I informed the patient that I do not believe that is appropriate and I would like to check his ammonia level as well as observe him in the ER to ensure that he is not having any waxing or waning change in his mental state.  Patient upset that I am recommending to observe him in the ER, as he wants to be released immediately but informed him that  this would be the standard of care with his complaint to evaluate if there is any indication for hospitalization and that leaving prior to completion of observation and medical work up would be against medical advice.  He agrees to plan.  Clinical Course as of Apr 17 903  Fri Apr 17, 2019  0757  Patient states that he needs to leave to go speak with contractors that are s working on his house today.  Tates he does not want to reschedule.  He is adamant that he is going to drive home.  Informed that do not feel that is appropriate or safe for him or the general public for him to be driving right now.  At the same time, I do not feel that he has to be hospitalized medically at this time.  I suspect he was feeling the effects of doubling his dose of anxiolytic overnight and simply recommend a period of observation.  He states that he will call a friend to get a ride home.   [PR]  [phone] Patient does have a safe ride home which was confirmed by nursing staff.  He is able to ambulate with a steady gait.  He is oriented x3.  Patient refusing to take lactulose and rifampin but agrees to take it when returning home.  Also refusing IV fluid.  Patient informed that he will be leaving [place].  Clinically is not confused at this time do believe he has the capacity to make his own medical decisions.  Encouraged the patient to return to the ER or for medical evaluation.   [PR]    Clinical Course User Index [PR] Merlyn Lot, MD    The patient was evaluated in Emergency Department today for the symptoms  described in the history of present illness. He/she was evaluated in the context of the global COVID-19 pandemic, which necessitated consideration that the patient might be at risk for infection with the SARS-CoV-2 virus that causes COVID-19. Institutional protocols and algorithms that pertain to the evaluation of patients at risk for COVID-19 are in a state of rapid change based on information  released by regulatory bodies including the CDC and federal and state organizations. These policies and algorithms were followed during the patient's care in the ED.  As part of my medical decision making, I reviewed the following data within the [facility] notes reviewed and incorporated, Labs reviewed, notes from prior ED visits and [place] Controlled Substance Database   ____________________________________________   FINAL CLINICAL IMPRESSION(S) / ED DIAGNOSES  Final diagnoses:  Confusion      NEW MEDICATIONS STARTED DURING THIS VISIT:  New Prescriptions   No medications on file     Note:  This document was prepared using Dragon voice recognition software and may include unintentional dictation errors.    Merlyn Lot, MD 04/17/19 [phone]

## 2019-04-17 NOTE — ED Notes (Signed)
RN in room to administer Rifaximin. Pt up in room, pt has disconnected himself from the monitor and had gotten dressed. Pt states that he wants to go home and that his attorney is waiting for him outside. Pt states that he has Rifaximin at home and can take it when he gets home. Pt requesting IV be taken out.  MD to bedside to speak with pt. Pt refusing to stay. RN verified that pt had a safe ride. Pts friend was outside waiting to pick patient up.

## 2019-04-17 NOTE — Discharge Instructions (Signed)
Please follow up with PCP.  Take your lactulose and Rifampin when you return home.

## 2019-04-17 NOTE — ED Notes (Signed)
Patient transported to CT 

## 2019-04-17 NOTE — ED Notes (Signed)
Lab called stating that they need a new type and screen sent.

## 2019-04-17 NOTE — ED Triage Notes (Signed)
Patient reports he woke up confused. He thought everything had been moved around in his house, but no one had moved anything. Patient reports he got lost twice driving here and he knows where the hospital is. Patient states: "I think I just let myself get dehydrated, I hope". Patient with labored respirations. Patient AO X 4.

## 2019-04-19 NOTE — Progress Notes (Signed)
[place]  [phone] [phone]  ID: Corey West OB: July 20, 1951  MR#: [id number]  IBB#:[id number]  Patient Care Team: Baxter Hire, MD as PCP - General (Internal Medicine) Baxter Hire, MD (Internal Medicine)  CHIEF COMPLAINT: Anemia, thrombocytopenia  INTERVAL HISTORY: Patient returns to clinic today for repeat laboratory work, further evaluation, and continuation of Procrit.  He continues to have weakness and fatigue, but otherwise feels well.  He has no neurologic complaints.  He denies any recent fevers.  He has a fair appetite, but denies weight loss.  He denies any chest pain, shortness of breath, cough, or hemoptysis. He denies any nausea, vomiting, constipation, or diarrhea.  He has no melena or hematochezia. He has no urinary complaints.  Patient offers no further specific complaints today.  REVIEW OF SYSTEMS:   Review of Systems  Constitutional: Positive for malaise/fatigue. Negative for fever and weight loss.  Respiratory: Negative.  Negative for cough, hemoptysis and shortness of breath.   Cardiovascular: Negative.  Negative for chest pain and leg swelling.  Gastrointestinal: Negative.  Negative for abdominal pain, blood in stool and melena.  Genitourinary: Negative.  Negative for hematuria.  Musculoskeletal: Negative.  Negative for back pain.  Skin: Negative.  Negative for rash.  Neurological: Positive for weakness. Negative for sensory change, focal weakness and headaches.  Psychiatric/Behavioral: Negative.  The patient is not nervous/anxious.     As per HPI. Otherwise, a complete review of systems is negative.  PAST MEDICAL HISTORY: Past Medical History:  Diagnosis Date  . Anemia   . Anxiety    controlled;   . Arthritis   . AVM (arteriovenous malformation) of stomach, acquired with hemorrhage   . Barrett's esophagus   . Chronic kidney disease    renal infufficiency  . Cirrhosis ([place])   . Depression     controlled;   Marland Kitchen Diabetes mellitus without complication ([place])    not controlled, taking insulin but sugar continues to run high;   . Edema   . Esophageal varices ([place])   . GAVE (gastric antral vascular ectasia)   . GERD (gastroesophageal reflux disease)   . History of hiatal hernia   . Hyperlipidemia   . Hypertension    controlled well;   . Liver cirrhosis ([place])   . Nephropathy, diabetic ([place])   . Obesity   . Pancytopenia ([place])   . Polyp, stomach    with chronic blood loss  . Sleep apnea    does not wear a cpap, Medicare would not pay for it   . Venous stasis dermatitis of both lower extremities   . Venous stasis of both lower extremities    with cellulitis    PAST SURGICAL HISTORY: Past Surgical History:  Procedure Laterality Date  . ESOPHAGOGASTRODUODENOSCOPY N/A 02/09/2015   Procedure: ESOPHAGOGASTRODUODENOSCOPY (EGD);  Surgeon: Manya Silvas, MD;  Location: [facility] ENDOSCOPY;  Service: Endoscopy;  Laterality: N/A;  . ESOPHAGOGASTRODUODENOSCOPY N/A 07/22/2015   Procedure: ESOPHAGOGASTRODUODENOSCOPY (EGD);  Surgeon: Manya Silvas, MD;  Location: [facility] ENDOSCOPY;  Service: Endoscopy;  Laterality: N/A;  . ESOPHAGOGASTRODUODENOSCOPY N/A 06/26/2017   Procedure: ESOPHAGOGASTRODUODENOSCOPY (EGD);  Surgeon: Manya Silvas, MD;  Location: [facility] ENDOSCOPY;  Service: Endoscopy;  Laterality: N/A;  . ESOPHAGOGASTRODUODENOSCOPY N/A 06/27/2017   Procedure: ESOPHAGOGASTRODUODENOSCOPY (EGD);  Surgeon: Manya Silvas, MD;  Location: [facility] ENDOSCOPY;  Service: Endoscopy;  Laterality: N/A;  . ESOPHAGOGASTRODUODENOSCOPY N/A 06/28/2017   Procedure: ESOPHAGOGASTRODUODENOSCOPY (EGD);  Surgeon: Manya Silvas, MD;  Location: [facility] ENDOSCOPY;  Service: Endoscopy;  Laterality: N/A;  . ESOPHAGOGASTRODUODENOSCOPY Bilateral 10/11/2017   Procedure: ESOPHAGOGASTRODUODENOSCOPY (EGD);  Surgeon: Manya Silvas, MD;  Location: [facility] ENDOSCOPY;  Service: Endoscopy;  Laterality: Bilateral;  .  ESOPHAGOGASTRODUODENOSCOPY N/A 12/04/2017   Procedure: ESOPHAGOGASTRODUODENOSCOPY (EGD);  Surgeon: Manya Silvas, MD;  Location: [facility] ENDOSCOPY;  Service: Endoscopy;  Laterality: N/A;  . ESOPHAGOGASTRODUODENOSCOPY N/A 03/06/2019   Procedure: ESOPHAGOGASTRODUODENOSCOPY (EGD);  Surgeon: [place], Benay Pike, MD;  Location: [facility] ENDOSCOPY;  Service: Gastroenterology;  Laterality: N/A;  . ESOPHAGOGASTRODUODENOSCOPY (EGD) WITH PROPOFOL N/A 07/20/2015   Procedure: ESOPHAGOGASTRODUODENOSCOPY (EGD) WITH PROPOFOL;  Surgeon: Manya Silvas, MD;  Location: [facility] ENDOSCOPY;  Service: Endoscopy;  Laterality: N/A;  . ESOPHAGOGASTRODUODENOSCOPY (EGD) WITH PROPOFOL N/A 09/16/2015   Procedure: ESOPHAGOGASTRODUODENOSCOPY (EGD) WITH PROPOFOL;  Surgeon: Manya Silvas, MD;  Location: [facility] ENDOSCOPY;  Service: Endoscopy;  Laterality: N/A;  . ESOPHAGOGASTRODUODENOSCOPY (EGD) WITH PROPOFOL N/A 03/16/2016   Procedure: ESOPHAGOGASTRODUODENOSCOPY (EGD) WITH PROPOFOL;  Surgeon: Manya Silvas, MD;  Location: [facility] ENDOSCOPY;  Service: Endoscopy;  Laterality: N/A;  . ESOPHAGOGASTRODUODENOSCOPY (EGD) WITH PROPOFOL N/A 09/14/2016   Procedure: ESOPHAGOGASTRODUODENOSCOPY (EGD) WITH PROPOFOL;  Surgeon: Manya Silvas, MD;  Location: [facility] ENDOSCOPY;  Service: Endoscopy;  Laterality: N/A;  . ESOPHAGOGASTRODUODENOSCOPY (EGD) WITH PROPOFOL N/A 11/05/2016   Procedure: ESOPHAGOGASTRODUODENOSCOPY (EGD) WITH PROPOFOL;  Surgeon: Manya Silvas, MD;  Location: [facility] ENDOSCOPY;  Service: Endoscopy;  Laterality: N/A;  . ESOPHAGOGASTRODUODENOSCOPY (EGD) WITH PROPOFOL N/A 02/06/2017   Procedure: ESOPHAGOGASTRODUODENOSCOPY (EGD) WITH PROPOFOL;  Surgeon: Manya Silvas, MD;  Location: [facility] ENDOSCOPY;  Service: Endoscopy;  Laterality: N/A;  . ESOPHAGOGASTRODUODENOSCOPY (EGD) WITH PROPOFOL N/A 04/24/2017   Procedure: ESOPHAGOGASTRODUODENOSCOPY (EGD) WITH PROPOFOL;  Surgeon: Manya Silvas, MD;  Location: [facility] ENDOSCOPY;  Service: Endoscopy;   Laterality: N/A;  . ESOPHAGOGASTRODUODENOSCOPY (EGD) WITH PROPOFOL N/A 07/31/2017   Procedure: ESOPHAGOGASTRODUODENOSCOPY (EGD) WITH PROPOFOL;  Surgeon: Manya Silvas, MD;  Location: [facility] ENDOSCOPY;  Service: Endoscopy;  Laterality: N/A;  . ESOPHAGOGASTRODUODENOSCOPY (EGD) WITH PROPOFOL N/A 08/08/2018   Procedure: ESOPHAGOGASTRODUODENOSCOPY (EGD) WITH PROPOFOL;  Surgeon: Manya Silvas, MD;  Location: [facility] ENDOSCOPY;  Service: Endoscopy;  Laterality: N/A;  . GIVENS CAPSULE STUDY N/A 12/05/2017   Procedure: GIVENS CAPSULE STUDY;  Surgeon: Manya Silvas, MD;  Location: [facility] ENDOSCOPY;  Service: Endoscopy;  Laterality: N/A;  . TONSILLECTOMY    . TONSILLECTOMY AND ADENOIDECTOMY    . ULNAR NERVE TRANSPOSITION    . UVULOPALATOPHARYNGOPLASTY      FAMILY HISTORY: Family History  Problem Relation Age of Onset  . Diabetes Other   . Transient ischemic attack Father   . CAD Father     ADVANCED DIRECTIVES (Y/N):  N  HEALTH MAINTENANCE: Social History   Tobacco Use  . Smoking status: Former Smoker    Years: 20.00    Types: Cigars  . Smokeless tobacco: Never Used  Substance Use Topics  . Alcohol use: No    Comment: stopped 15 years ago  . Drug use: No     Colonoscopy:  PAP:  Bone density:  Lipid panel:  No Known Allergies  Current Outpatient Medications  Medication Sig Dispense Refill  . atorvastatin (LIPITOR) 40 MG tablet Take 1 tablet (40 mg total) by mouth daily at 6 PM. 30 tablet 0  . cetirizine (ZYRTEC) 10 MG tablet Take 10 mg by mouth daily.    . ferrous sulfate 325 (65 FE) MG tablet Take 325 mg by mouth 2 (two) times daily with a meal.     . furosemide (LASIX) 40 MG tablet Take  40 mg by mouth 2 (two) times a day.     . insulin aspart protamine - aspart (NOVOLOG 70/30 MIX) (70-30) 100 UNIT/ML FlexPen Inject 70-84 Units into the skin See admin instructions. Inject 84u under the skin every morning and 70u under the skin every evening    . lactulose (CHRONULAC) 10  GM/15ML solution Take 30 g by mouth 4 (four) times daily.     Marland Kitchen LORazepam (ATIVAN) 0.5 MG tablet Take 1 tablet (0.5 mg total) by mouth every 8 (eight) hours as needed for anxiety. 20 tablet 0  . metolazone (ZAROXOLYN) 5 MG tablet Take 5 mg by mouth daily.    . Multiple Vitamin (MULTIVITAMIN) tablet Take 1 tablet by mouth daily.    . Potassium Chloride ER 20 MEQ TBCR Take 20 mEq by mouth 2 (two) times daily. While on lasix' (Patient taking differently: Take 20 mEq by mouth 2 (two) times daily. ) 30 tablet 1  . rifaximin (XIFAXAN) 550 MG TABS tablet Take 1 tablet (550 mg total) by mouth 2 (two) times daily. 60 tablet 0  . traMADol (ULTRAM) 50 MG tablet Take 50 mg by mouth 3 (three) times daily.     . vitamin C (ASCORBIC ACID) 250 MG tablet Take 500 mg by mouth 2 (two) times daily.    Marland Kitchen zinc sulfate 220 (50 Zn) MG capsule Take 220 mg by mouth daily.    Marland Kitchen omeprazole (PRILOSEC) 40 MG capsule Take 40 mg by mouth daily.    . pantoprazole (PROTONIX) 40 MG tablet Take 1 tablet (40 mg total) by mouth 2 (two) times daily. 60 tablet 11   No current facility-administered medications for this visit.     OBJECTIVE: Vitals:   04/24/19 1028  BP: (!) 159/68  Pulse: 79  Resp: 18  Temp: 98 F (36.7 C)     Body mass index is 42.03 kg/m.    ECOG FS:0 - Asymptomatic  General: Well-developed, well-nourished, no acute distress. Eyes: Pink conjunctiva, anicteric sclera. HEENT: Normocephalic, moist mucous membranes. Lungs: Clear to auscultation bilaterally. Heart: Regular rate and rhythm. No rubs, murmurs, or gallops. Abdomen: Soft, nontender, nondistended. No organomegaly noted, normoactive bowel sounds. Musculoskeletal: No edema, cyanosis, or clubbing. Neuro: Alert, answering all questions appropriately. Cranial nerves grossly intact. Skin: No rashes or petechiae noted. Psych: Normal affect.  LAB RESULTS:  Lab Results  Component Value Date   NA 137 04/17/2019   K 3.6 04/17/2019   CL 102 04/17/2019    CO2 26 04/17/2019   GLUCOSE 292 (H) 04/17/2019   BUN 33 (H) 04/17/2019   CREATININE 2.38 (H) 04/17/2019   CALCIUM 8.9 04/17/2019   PROT 6.7 04/17/2019   ALBUMIN 3.0 (L) 04/17/2019   AST 31 04/17/2019   ALT 22 04/17/2019   ALKPHOS 82 04/17/2019   BILITOT 0.7 04/17/2019   GFRNONAA 27 (L) 04/17/2019   GFRAA 31 (L) 04/17/2019    Lab Results  Component Value Date   WBC 4.2 04/24/2019   NEUTROABS 3.3 04/24/2019   HGB 8.7 (L) 04/24/2019   HCT 26.6 (L) 04/24/2019   MCV 93.0 04/24/2019   PLT 97 (L) 04/24/2019   Lab Results  Component Value Date   IRON 122 04/24/2019   TIBC 374 04/24/2019   IRONPCTSAT 33 04/24/2019   Lab Results  Component Value Date   FERRITIN 28 04/24/2019     STUDIES: Dg Chest 2 View  Result Date: 04/17/2019 CLINICAL DATA:  Patient unable to explain why he is here. Hx of Htn, dm,  ex smoker. Per ED note "Patient woke up confused. He thought everything had been moved around in his house, but no one had moved anything. Patient reports he got lost twice driving here and he knows where the hospital is. Patient states: "I think I just let myself get dehydrated " EXAM: CHEST - 2 VIEW COMPARISON:  03/14/2019 and prior exams. FINDINGS: Cardiac silhouette is normal in size. No mediastinal or hilar masses. No evidence of adenopathy. Clear lungs.  No pleural effusion or pneumothorax. Skeletal structures are intact. IMPRESSION: No active cardiopulmonary disease. Electronically Signed   By: Lajean Manes M.D.   On: 04/17/2019 08:06   Ct Head Wo Contrast  Result Date: 04/17/2019 CLINICAL DATA:  Altered mental status with unclear cause EXAM: CT HEAD WITHOUT CONTRAST TECHNIQUE: Contiguous axial images were obtained from the base of the skull through the vertex without intravenous contrast. COMPARISON:  01/15/2019 brain MRI FINDINGS: Brain: No evidence of acute infarction, hemorrhage, hydrocephalus, extra-axial collection or mass lesion/mass effect. Vascular: No hyperdense vessel  or unexpected calcification. Skull: Normal. Negative for fracture or focal lesion. Sinuses/Orbits: No acute finding. Other: Presumed dermal inclusion cyst in the midline upper neck. IMPRESSION: Negative head CT. Electronically Signed   By: Monte Fantasia M.D.   On: 04/17/2019 06:27    ASSESSMENT: Anemia, thrombocytopenia  PLAN:    1.  Anemia: Likely secondary to chronic renal insufficiency.  Patient also has known AV malformations in his stomach that may be contributing.  Patient's most recent EGD was on August 08, 2018.  His hemoglobin is significantly decreased and he will benefit from 40,000 units Procrit today.  Iron stores continue to be within normal limits.  Return to clinic every 4 weeks for laboratory work and Procrit if his hemoglobin remains below 10.0.  Patient will then return to clinic in 4 months for further evaluation and continuation of treatment. 2.  Thrombocytopenia: Chronic and unchanged.  Possibly secondary to underlying cirrhosis patient platelet count is 97 today. 3.  Chronic renal insufficiency: Continue monitoring and treatment per nephrology.   Patient expressed understanding and was in agreement with this plan. He also understands that He can call clinic at any time with any questions, concerns, or complaints.    Lloyd Huger, MD   04/26/2019 8:07 AM

## 2019-04-24 ENCOUNTER — Inpatient Hospital Stay: Payer: Medicare Other | Attending: Oncology

## 2019-04-24 ENCOUNTER — Other Ambulatory Visit: Payer: Self-pay

## 2019-04-24 ENCOUNTER — Inpatient Hospital Stay: Payer: Medicare Other

## 2019-04-24 ENCOUNTER — Inpatient Hospital Stay (HOSPITAL_BASED_OUTPATIENT_CLINIC_OR_DEPARTMENT_OTHER): Payer: Medicare Other | Admitting: Oncology

## 2019-04-24 ENCOUNTER — Encounter: Payer: Self-pay | Admitting: Oncology

## 2019-04-24 VITALS — BP 159/68 | HR 79 | Temp 98.0°F | Resp 18 | Wt 309.9 lb

## 2019-04-24 DIAGNOSIS — E1122 Type 2 diabetes mellitus with diabetic chronic kidney disease: Secondary | ICD-10-CM | POA: Insufficient documentation

## 2019-04-24 DIAGNOSIS — I129 Hypertensive chronic kidney disease with stage 1 through stage 4 chronic kidney disease, or unspecified chronic kidney disease: Secondary | ICD-10-CM | POA: Diagnosis not present

## 2019-04-24 DIAGNOSIS — D696 Thrombocytopenia, unspecified: Secondary | ICD-10-CM | POA: Insufficient documentation

## 2019-04-24 DIAGNOSIS — N189 Chronic kidney disease, unspecified: Secondary | ICD-10-CM

## 2019-04-24 DIAGNOSIS — D631 Anemia in chronic kidney disease: Secondary | ICD-10-CM

## 2019-04-24 DIAGNOSIS — E669 Obesity, unspecified: Secondary | ICD-10-CM | POA: Insufficient documentation

## 2019-04-24 DIAGNOSIS — D500 Iron deficiency anemia secondary to blood loss (chronic): Secondary | ICD-10-CM

## 2019-04-24 DIAGNOSIS — Z87891 Personal history of nicotine dependence: Secondary | ICD-10-CM

## 2019-04-24 LAB — CBC WITH DIFFERENTIAL/PLATELET
Abs Immature Granulocytes: 0.02 10*3/uL (ref 0.00–0.07)
Basophils Absolute: 0 10*3/uL (ref 0.0–0.1)
Basophils Relative: 1 %
Eosinophils Absolute: 0 10*3/uL (ref 0.0–0.5)
Eosinophils Relative: 0 %
HCT: 26.6 % — ABNORMAL LOW (ref 39.0–52.0)
Hemoglobin: 8.7 g/dL — ABNORMAL LOW (ref 13.0–17.0)
Immature Granulocytes: 1 %
Lymphocytes Relative: 15 %
Lymphs Abs: 0.6 10*3/uL — ABNORMAL LOW (ref 0.7–4.0)
MCH: 30.4 pg (ref 26.0–34.0)
MCHC: 32.7 g/dL (ref 30.0–36.0)
MCV: 93 fL (ref 80.0–100.0)
Monocytes Absolute: 0.3 10*3/uL (ref 0.1–1.0)
Monocytes Relative: 7 %
Neutro Abs: 3.3 10*3/uL (ref 1.7–7.7)
Neutrophils Relative %: 76 %
Platelets: 97 10*3/uL — ABNORMAL LOW (ref 150–400)
RBC: 2.86 MIL/uL — ABNORMAL LOW (ref 4.22–5.81)
RDW: 15.2 % (ref 11.5–15.5)
WBC: 4.2 10*3/uL (ref 4.0–10.5)
nRBC: 0 % (ref 0.0–0.2)

## 2019-04-24 LAB — IRON AND TIBC
Iron: 122 ug/dL (ref 45–182)
Saturation Ratios: 33 % (ref 17.9–39.5)
TIBC: 374 ug/dL (ref 250–450)
UIBC: 252 ug/dL

## 2019-04-24 LAB — FERRITIN: Ferritin: 28 ng/mL (ref 24–336)

## 2019-04-24 MED ORDER — EPOETIN ALFA 10000 UNIT/ML IJ SOLN
10000.0000 [IU] | Freq: Once | INTRAMUSCULAR | Status: AC
  Administered 2019-04-24: 11:00:00 10000 [IU] via SUBCUTANEOUS
  Filled 2019-04-24: qty 2

## 2019-04-28 ENCOUNTER — Encounter: Payer: Self-pay | Admitting: Emergency Medicine

## 2019-04-28 ENCOUNTER — Emergency Department: Payer: Medicare Other

## 2019-04-28 ENCOUNTER — Other Ambulatory Visit: Payer: Self-pay

## 2019-04-28 ENCOUNTER — Observation Stay
Admission: EM | Admit: 2019-04-28 | Discharge: 2019-04-29 | Disposition: A | Discharge status: Home Health | Payer: Medicare Other | Attending: Specialist | Admitting: Specialist

## 2019-04-28 DIAGNOSIS — K7290 Hepatic failure, unspecified without coma: Secondary | ICD-10-CM | POA: Diagnosis present

## 2019-04-28 DIAGNOSIS — F419 Anxiety disorder, unspecified: Secondary | ICD-10-CM | POA: Insufficient documentation

## 2019-04-28 DIAGNOSIS — N183 Chronic kidney disease, stage 3 (moderate): Secondary | ICD-10-CM | POA: Insufficient documentation

## 2019-04-28 DIAGNOSIS — Z794 Long term (current) use of insulin: Secondary | ICD-10-CM | POA: Diagnosis not present

## 2019-04-28 DIAGNOSIS — F329 Major depressive disorder, single episode, unspecified: Secondary | ICD-10-CM | POA: Diagnosis not present

## 2019-04-28 DIAGNOSIS — Z87891 Personal history of nicotine dependence: Secondary | ICD-10-CM | POA: Diagnosis not present

## 2019-04-28 DIAGNOSIS — Z6841 Body Mass Index (BMI) 40.0 and over, adult: Secondary | ICD-10-CM | POA: Diagnosis not present

## 2019-04-28 DIAGNOSIS — Z8249 Family history of ischemic heart disease and other diseases of the circulatory system: Secondary | ICD-10-CM | POA: Insufficient documentation

## 2019-04-28 DIAGNOSIS — R4182 Altered mental status, unspecified: Secondary | ICD-10-CM

## 2019-04-28 DIAGNOSIS — Z1159 Encounter for screening for other viral diseases: Secondary | ICD-10-CM | POA: Diagnosis not present

## 2019-04-28 DIAGNOSIS — D696 Thrombocytopenia, unspecified: Secondary | ICD-10-CM | POA: Diagnosis not present

## 2019-04-28 DIAGNOSIS — D649 Anemia, unspecified: Secondary | ICD-10-CM | POA: Insufficient documentation

## 2019-04-28 DIAGNOSIS — K7460 Unspecified cirrhosis of liver: Secondary | ICD-10-CM | POA: Insufficient documentation

## 2019-04-28 DIAGNOSIS — I1310 Hypertensive heart and chronic kidney disease without heart failure, with stage 1 through stage 4 chronic kidney disease, or unspecified chronic kidney disease: Secondary | ICD-10-CM | POA: Insufficient documentation

## 2019-04-28 DIAGNOSIS — E1122 Type 2 diabetes mellitus with diabetic chronic kidney disease: Secondary | ICD-10-CM | POA: Insufficient documentation

## 2019-04-28 DIAGNOSIS — E785 Hyperlipidemia, unspecified: Secondary | ICD-10-CM | POA: Diagnosis not present

## 2019-04-28 DIAGNOSIS — Z79899 Other long term (current) drug therapy: Secondary | ICD-10-CM | POA: Insufficient documentation

## 2019-04-28 DIAGNOSIS — G9341 Metabolic encephalopathy: Secondary | ICD-10-CM | POA: Insufficient documentation

## 2019-04-28 DIAGNOSIS — G4733 Obstructive sleep apnea (adult) (pediatric): Secondary | ICD-10-CM | POA: Insufficient documentation

## 2019-04-28 DIAGNOSIS — K7682 Hepatic encephalopathy: Secondary | ICD-10-CM

## 2019-04-28 DIAGNOSIS — K2270 Barrett's esophagus without dysplasia: Secondary | ICD-10-CM | POA: Insufficient documentation

## 2019-04-28 DIAGNOSIS — K219 Gastro-esophageal reflux disease without esophagitis: Secondary | ICD-10-CM | POA: Insufficient documentation

## 2019-04-28 DIAGNOSIS — Z833 Family history of diabetes mellitus: Secondary | ICD-10-CM | POA: Diagnosis not present

## 2019-04-28 DIAGNOSIS — I872 Venous insufficiency (chronic) (peripheral): Secondary | ICD-10-CM | POA: Diagnosis not present

## 2019-04-28 LAB — COMPREHENSIVE METABOLIC PANEL
ALT: 19 U/L (ref 0–44)
AST: 26 U/L (ref 15–41)
Albumin: 2.9 g/dL — ABNORMAL LOW (ref 3.5–5.0)
Alkaline Phosphatase: 69 U/L (ref 38–126)
Anion gap: 11 (ref 5–15)
BUN: 41 mg/dL — ABNORMAL HIGH (ref 8–23)
CO2: 26 mmol/L (ref 22–32)
Calcium: 8.8 mg/dL — ABNORMAL LOW (ref 8.9–10.3)
Chloride: 103 mmol/L (ref 98–111)
Creatinine, Ser: 2.58 mg/dL — ABNORMAL HIGH (ref 0.61–1.24)
GFR calc Af Amer: 29 mL/min — ABNORMAL LOW (ref >60)
GFR calc non Af Amer: 25 mL/min — ABNORMAL LOW (ref >60)
Glucose, Bld: 492 mg/dL — ABNORMAL HIGH (ref 70–99)
Potassium: 3.7 mmol/L (ref 3.5–5.1)
Sodium: 140 mmol/L (ref 135–145)
Total Bilirubin: 0.6 mg/dL (ref 0.3–1.2)
Total Protein: 6.7 g/dL (ref 6.5–8.1)

## 2019-04-28 LAB — LIPASE, BLOOD: Lipase: 41 U/L (ref 11–51)

## 2019-04-28 LAB — CBC WITH DIFFERENTIAL/PLATELET
Abs Immature Granulocytes: 0.01 10*3/uL (ref 0.00–0.07)
Basophils Absolute: 0 10*3/uL (ref 0.0–0.1)
Basophils Relative: 0 %
Eosinophils Absolute: 0 10*3/uL (ref 0.0–0.5)
Eosinophils Relative: 0 %
HCT: 25.9 % — ABNORMAL LOW (ref 39.0–52.0)
Hemoglobin: 8.6 g/dL — ABNORMAL LOW (ref 13.0–17.0)
Immature Granulocytes: 0 %
Lymphocytes Relative: 18 %
Lymphs Abs: 0.7 10*3/uL (ref 0.7–4.0)
MCH: 30.8 pg (ref 26.0–34.0)
MCHC: 33.2 g/dL (ref 30.0–36.0)
MCV: 92.8 fL (ref 80.0–100.0)
Monocytes Absolute: 0.4 10*3/uL (ref 0.1–1.0)
Monocytes Relative: 10 %
Neutro Abs: 3 10*3/uL (ref 1.7–7.7)
Neutrophils Relative %: 72 %
Platelets: 103 10*3/uL — ABNORMAL LOW (ref 150–400)
RBC: 2.79 MIL/uL — ABNORMAL LOW (ref 4.22–5.81)
RDW: 15.2 % (ref 11.5–15.5)
WBC: 4.1 10*3/uL (ref 4.0–10.5)
nRBC: 0 % (ref 0.0–0.2)

## 2019-04-28 LAB — SARS CORONAVIRUS 2 BY RT PCR (HOSPITAL ORDER, PERFORMED IN ~~LOC~~ HOSPITAL LAB): SARS Coronavirus 2: NEGATIVE

## 2019-04-28 LAB — GLUCOSE, CAPILLARY: Glucose-Capillary: 466 mg/dL — ABNORMAL HIGH (ref 70–99)

## 2019-04-28 LAB — AMMONIA: Ammonia: 143 umol/L — ABNORMAL HIGH (ref 9–35)

## 2019-04-28 MED ORDER — FERROUS SULFATE 325 (65 FE) MG PO TABS
325.0000 mg | ORAL_TABLET | Freq: Two times a day (BID) | ORAL | Status: DC
  Administered 2019-04-29: 09:00:00 325 mg via ORAL
  Filled 2019-04-28 (×2): qty 1

## 2019-04-28 MED ORDER — SODIUM CHLORIDE 0.9 % IV SOLN
INTRAVENOUS | Status: DC
  Administered 2019-04-29: 01:00:00 via INTRAVENOUS

## 2019-04-28 MED ORDER — LORAZEPAM 0.5 MG PO TABS
0.5000 mg | ORAL_TABLET | Freq: Three times a day (TID) | ORAL | Status: DC | PRN
  Administered 2019-04-29: 05:00:00 0.5 mg via ORAL
  Filled 2019-04-28: qty 1

## 2019-04-28 MED ORDER — SODIUM CHLORIDE 0.9 % IV SOLN
1.0000 g | INTRAVENOUS | Status: DC
  Administered 2019-04-29: 1 g via INTRAVENOUS
  Filled 2019-04-28 (×2): qty 10

## 2019-04-28 MED ORDER — LORATADINE 10 MG PO TABS
10.0000 mg | ORAL_TABLET | Freq: Every day | ORAL | Status: DC
  Administered 2019-04-29: 10 mg via ORAL
  Filled 2019-04-28: qty 1

## 2019-04-28 MED ORDER — ADULT MULTIVITAMIN W/MINERALS CH
1.0000 | ORAL_TABLET | Freq: Every day | ORAL | Status: DC
  Administered 2019-04-29: 1 via ORAL
  Filled 2019-04-28: qty 1

## 2019-04-28 MED ORDER — INSULIN ASPART 100 UNIT/ML ~~LOC~~ SOLN
0.0000 [IU] | Freq: Three times a day (TID) | SUBCUTANEOUS | Status: DC
  Administered 2019-04-29: 3 [IU] via SUBCUTANEOUS
  Administered 2019-04-29: 5 [IU] via SUBCUTANEOUS
  Filled 2019-04-28: qty 1

## 2019-04-28 MED ORDER — INSULIN ASPART 100 UNIT/ML ~~LOC~~ SOLN
0.0000 [IU] | Freq: Every day | SUBCUTANEOUS | Status: DC
  Administered 2019-04-29: 01:00:00 5 [IU] via SUBCUTANEOUS
  Filled 2019-04-28: qty 1

## 2019-04-28 MED ORDER — POTASSIUM CHLORIDE CRYS ER 20 MEQ PO TBCR
20.0000 meq | EXTENDED_RELEASE_TABLET | Freq: Two times a day (BID) | ORAL | Status: DC
  Administered 2019-04-29: 20 meq via ORAL
  Filled 2019-04-28: qty 1

## 2019-04-28 MED ORDER — ACETAMINOPHEN 650 MG RE SUPP: 650.0000 mg | Freq: Four times a day (QID) | RECTAL | Status: DC | PRN

## 2019-04-28 MED ORDER — ACETAMINOPHEN 325 MG PO TABS: 650.0000 mg | ORAL_TABLET | Freq: Four times a day (QID) | ORAL | Status: DC | PRN

## 2019-04-28 MED ORDER — ONDANSETRON HCL 4 MG PO TABS: 4.0000 mg | ORAL_TABLET | Freq: Four times a day (QID) | ORAL | Status: DC | PRN

## 2019-04-28 MED ORDER — ONDANSETRON HCL 4 MG/2ML IJ SOLN: 4.0000 mg | Freq: Four times a day (QID) | INTRAMUSCULAR | Status: DC | PRN

## 2019-04-28 MED ORDER — ATORVASTATIN CALCIUM 20 MG PO TABS: 40.0000 mg | ORAL_TABLET | Freq: Every day | ORAL | Status: DC

## 2019-04-28 MED ORDER — VITAMIN C 500 MG PO TABS
500.0000 mg | ORAL_TABLET | Freq: Two times a day (BID) | ORAL | Status: DC
  Administered 2019-04-29: 500 mg via ORAL

## 2019-04-28 MED ORDER — RIFAXIMIN 550 MG PO TABS
550.0000 mg | ORAL_TABLET | Freq: Two times a day (BID) | ORAL | Status: DC
  Administered 2019-04-29: 550 mg via ORAL
  Filled 2019-04-28 (×2): qty 1

## 2019-04-28 MED ORDER — PANTOPRAZOLE SODIUM 40 MG PO TBEC
40.0000 mg | DELAYED_RELEASE_TABLET | Freq: Every day | ORAL | Status: DC
  Administered 2019-04-29: 09:00:00 40 mg via ORAL
  Filled 2019-04-28: qty 1

## 2019-04-28 MED ORDER — LACTULOSE 10 GM/15ML PO SOLN
45.0000 g | ORAL | Status: AC
  Administered 2019-04-29: 20 g via ORAL
  Filled 2019-04-28: qty 90

## 2019-04-28 MED ORDER — INSULIN ASPART PROT & ASPART (70-30 MIX) 100 UNIT/ML ~~LOC~~ SUSP
60.0000 [IU] | Freq: Two times a day (BID) | SUBCUTANEOUS | Status: DC
  Administered 2019-04-29: 09:00:00 60 [IU] via SUBCUTANEOUS
  Filled 2019-04-28: qty 10

## 2019-04-28 MED ORDER — LACTULOSE 10 GM/15ML PO SOLN: 30.0000 g | Freq: Once | ORAL | Status: DC

## 2019-04-28 MED ORDER — ZINC SULFATE 220 (50 ZN) MG PO CAPS
220.0000 mg | ORAL_CAPSULE | Freq: Every day | ORAL | Status: DC
  Administered 2019-04-29: 220 mg via ORAL
  Filled 2019-04-28: qty 1

## 2019-04-28 MED ORDER — FUROSEMIDE 40 MG PO TABS
40.0000 mg | ORAL_TABLET | Freq: Two times a day (BID) | ORAL | Status: DC
  Administered 2019-04-29: 09:00:00 40 mg via ORAL
  Filled 2019-04-28: qty 1

## 2019-04-28 MED ORDER — LACTULOSE 10 GM/15ML PO SOLN
30.0000 g | ORAL | Status: DC
  Administered 2019-04-29: 05:00:00 30 g via ORAL
  Filled 2019-04-28 (×2): qty 60

## 2019-04-28 NOTE — H&P (Signed)
[facility] [place] NAME: Corey West    MR#:  [id number]  DATE OF BIRTH:  23-Sep-1951  DATE OF ADMISSION:  04/28/2019  PRIMARY CARE PHYSICIAN: Baxter Hire, MD   REQUESTING/REFERRING PHYSICIAN: Dr Merlyn Lot  CHIEF COMPLAINT:   Chief Complaint  Patient presents with  . Altered Mental Status    HISTORY OF PRESENT ILLNESS:  Corey West  is a 68 y.o. male with a known history of cirrhosis of the liver and hepatic encephalopathy.  He presents with not knowing where he is.  He was told that he had 2 doctors appointments and that he did not go to them.  Here in the emergency room he was told that he did go to them.  He states he has been forgetting things.  In the ER his ammonia level was 143.  Hospitalist services were contacted for further evaluation.  Patient states he takes his Xifaxan and lactulose.  Patient only had one bowel movement today.  PAST MEDICAL HISTORY:   Past Medical History:  Diagnosis Date  . Anemia   . Anxiety    controlled;   . Arthritis   . AVM (arteriovenous malformation) of stomach, acquired with hemorrhage   . Barrett's esophagus   . Chronic kidney disease    renal infufficiency  . Cirrhosis ([place])   . Depression    controlled;   Marland Kitchen Diabetes mellitus without complication ([place])    not controlled, taking insulin but sugar continues to run high;   . Edema   . Esophageal varices ([place])   . GAVE (gastric antral vascular ectasia)   . GERD (gastroesophageal reflux disease)   . History of hiatal hernia   . Hyperlipidemia   . Hypertension    controlled well;   . Liver cirrhosis ([place])   . Nephropathy, diabetic ([place])   . Obesity   . Pancytopenia ([place])   . Polyp, stomach    with chronic blood loss  . Sleep apnea    does not wear a cpap, Medicare would not pay for it   . Venous stasis dermatitis of both lower extremities   . Venous stasis of both lower extremities    with cellulitis    PAST  SURGICAL HISTORY:   Past Surgical History:  Procedure Laterality Date  . ESOPHAGOGASTRODUODENOSCOPY N/A 02/09/2015   Procedure: ESOPHAGOGASTRODUODENOSCOPY (EGD);  Surgeon: Manya Silvas, MD;  Location: [facility] ENDOSCOPY;  Service: Endoscopy;  Laterality: N/A;  . ESOPHAGOGASTRODUODENOSCOPY N/A 07/22/2015   Procedure: ESOPHAGOGASTRODUODENOSCOPY (EGD);  Surgeon: Manya Silvas, MD;  Location: [facility] ENDOSCOPY;  Service: Endoscopy;  Laterality: N/A;  . ESOPHAGOGASTRODUODENOSCOPY N/A 06/26/2017   Procedure: ESOPHAGOGASTRODUODENOSCOPY (EGD);  Surgeon: Manya Silvas, MD;  Location: [facility] ENDOSCOPY;  Service: Endoscopy;  Laterality: N/A;  . ESOPHAGOGASTRODUODENOSCOPY N/A 06/27/2017   Procedure: ESOPHAGOGASTRODUODENOSCOPY (EGD);  Surgeon: Manya Silvas, MD;  Location: [facility] ENDOSCOPY;  Service: Endoscopy;  Laterality: N/A;  . ESOPHAGOGASTRODUODENOSCOPY N/A 06/28/2017   Procedure: ESOPHAGOGASTRODUODENOSCOPY (EGD);  Surgeon: Manya Silvas, MD;  Location: [facility] ENDOSCOPY;  Service: Endoscopy;  Laterality: N/A;  . ESOPHAGOGASTRODUODENOSCOPY Bilateral 10/11/2017   Procedure: ESOPHAGOGASTRODUODENOSCOPY (EGD);  Surgeon: Manya Silvas, MD;  Location: [facility] ENDOSCOPY;  Service: Endoscopy;  Laterality: Bilateral;  . ESOPHAGOGASTRODUODENOSCOPY N/A 12/04/2017   Procedure: ESOPHAGOGASTRODUODENOSCOPY (EGD);  Surgeon: Manya Silvas, MD;  Location: [facility] ENDOSCOPY;  Service: Endoscopy;  Laterality: N/A;  . ESOPHAGOGASTRODUODENOSCOPY N/A 03/06/2019   Procedure: ESOPHAGOGASTRODUODENOSCOPY (EGD);  Surgeon: Tol[place], Benay Pike, MD;  Location: [facility] ENDOSCOPY;  Service: Gastroenterology;  Laterality: N/A;  . ESOPHAGOGASTRODUODENOSCOPY (EGD) WITH PROPOFOL N/A 07/20/2015   Procedure: ESOPHAGOGASTRODUODENOSCOPY (EGD) WITH PROPOFOL;  Surgeon: Manya Silvas, MD;  Location: [facility] ENDOSCOPY;  Service: Endoscopy;  Laterality: N/A;  . ESOPHAGOGASTRODUODENOSCOPY (EGD) WITH PROPOFOL N/A 09/16/2015   Procedure:  ESOPHAGOGASTRODUODENOSCOPY (EGD) WITH PROPOFOL;  Surgeon: Manya Silvas, MD;  Location: [facility] ENDOSCOPY;  Service: Endoscopy;  Laterality: N/A;  . ESOPHAGOGASTRODUODENOSCOPY (EGD) WITH PROPOFOL N/A 03/16/2016   Procedure: ESOPHAGOGASTRODUODENOSCOPY (EGD) WITH PROPOFOL;  Surgeon: Manya Silvas, MD;  Location: [facility] ENDOSCOPY;  Service: Endoscopy;  Laterality: N/A;  . ESOPHAGOGASTRODUODENOSCOPY (EGD) WITH PROPOFOL N/A 09/14/2016   Procedure: ESOPHAGOGASTRODUODENOSCOPY (EGD) WITH PROPOFOL;  Surgeon: Manya Silvas, MD;  Location: [facility] ENDOSCOPY;  Service: Endoscopy;  Laterality: N/A;  . ESOPHAGOGASTRODUODENOSCOPY (EGD) WITH PROPOFOL N/A 11/05/2016   Procedure: ESOPHAGOGASTRODUODENOSCOPY (EGD) WITH PROPOFOL;  Surgeon: Manya Silvas, MD;  Location: [facility] ENDOSCOPY;  Service: Endoscopy;  Laterality: N/A;  . ESOPHAGOGASTRODUODENOSCOPY (EGD) WITH PROPOFOL N/A 02/06/2017   Procedure: ESOPHAGOGASTRODUODENOSCOPY (EGD) WITH PROPOFOL;  Surgeon: Manya Silvas, MD;  Location: [facility] ENDOSCOPY;  Service: Endoscopy;  Laterality: N/A;  . ESOPHAGOGASTRODUODENOSCOPY (EGD) WITH PROPOFOL N/A 04/24/2017   Procedure: ESOPHAGOGASTRODUODENOSCOPY (EGD) WITH PROPOFOL;  Surgeon: Manya Silvas, MD;  Location: [facility] ENDOSCOPY;  Service: Endoscopy;  Laterality: N/A;  . ESOPHAGOGASTRODUODENOSCOPY (EGD) WITH PROPOFOL N/A 07/31/2017   Procedure: ESOPHAGOGASTRODUODENOSCOPY (EGD) WITH PROPOFOL;  Surgeon: Manya Silvas, MD;  Location: [facility] ENDOSCOPY;  Service: Endoscopy;  Laterality: N/A;  . ESOPHAGOGASTRODUODENOSCOPY (EGD) WITH PROPOFOL N/A 08/08/2018   Procedure: ESOPHAGOGASTRODUODENOSCOPY (EGD) WITH PROPOFOL;  Surgeon: Manya Silvas, MD;  Location: [facility] ENDOSCOPY;  Service: Endoscopy;  Laterality: N/A;  . GIVENS CAPSULE STUDY N/A 12/05/2017   Procedure: GIVENS CAPSULE STUDY;  Surgeon: Manya Silvas, MD;  Location: [facility] ENDOSCOPY;  Service: Endoscopy;  Laterality: N/A;  . TONSILLECTOMY    . TONSILLECTOMY AND  ADENOIDECTOMY    . ULNAR NERVE TRANSPOSITION    . UVULOPALATOPHARYNGOPLASTY      SOCIAL HISTORY:   Social History   Tobacco Use  . Smoking status: Former Smoker    Years: 20.00    Types: Cigars, Cigarettes  . Smokeless tobacco: Never Used  Substance Use Topics  . Alcohol use: No    Comment: stopped 15 years ago    FAMILY HISTORY:   Family History  Problem Relation Age of Onset  . Diabetes Other   . Transient ischemic attack Father   . CAD Father     DRUG ALLERGIES:  No Known Allergies  REVIEW OF SYSTEMS:  CONSTITUTIONAL: No fever, chills or sweats.  Positive for fatigue and weakness.  EYES: No blurred or double vision.  EARS, NOSE, AND THROAT: No tinnitus or ear pain. No sore throat RESPIRATORY: No cough, shortness of breath, wheezing or hemoptysis.  CARDIOVASCULAR: No chest pain, orthopnea, edema.  GASTROINTESTINAL: No nausea, vomiting, diarrhea or abdominal pain.  History of blood in bowel movements but none recent. GENITOURINARY: No dysuria, hematuria.  ENDOCRINE: No polyuria, nocturia,  HEMATOLOGY: No anemia, easy bruising or bleeding SKIN: Chronic skin ulcerations MUSCULOSKELETAL: No joint pain or arthritis.   NEUROLOGIC: No tingling, numbness, weakness.  PSYCHIATRY: History of depression.   MEDICATIONS AT HOME:   Prior to Admission medications   Medication Sig Start Date End Date Taking? Authorizing Provider  atorvastatin (LIPITOR) 40 MG tablet Take 1 tablet (40 mg total) by mouth daily at 6 PM. 02/05/19   Gouru, Aruna, MD  cetirizine (ZYRTEC) 10 MG tablet Take 10 mg by  mouth daily.    [provider]  ferrous sulfate 325 (65 FE) MG tablet Take 325 mg by mouth 2 (two) times daily with a meal.     [provider]  furosemide (LASIX) 40 MG tablet Take 40 mg by mouth 2 (two) times a day.  01/13/19   [provider]  insulin aspart protamine - aspart (NOVOLOG 70/30 MIX) (70-30) 100 UNIT/ML FlexPen Inject 70-84 Units into the skin See  admin instructions. Inject 84u under the skin every morning and 70u under the skin every evening    [provider]  lactulose (CHRONULAC) 10 GM/15ML solution Take 30 g by mouth 4 (four) times daily.  12/11/18   [provider]  LORazepam (ATIVAN) 0.5 MG tablet Take 1 tablet (0.5 mg total) by mouth every 8 (eight) hours as needed for anxiety. 10/18/18   Gladstone Lighter, MD  metolazone (ZAROXOLYN) 5 MG tablet Take 5 mg by mouth daily.    [provider]  Multiple Vitamin (MULTIVITAMIN) tablet Take 1 tablet by mouth daily.    [provider]  omeprazole (PRILOSEC) 40 MG capsule Take 40 mg by mouth daily.    [provider]  pantoprazole (PROTONIX) 40 MG tablet Take 1 tablet (40 mg total) by mouth 2 (two) times daily. 03/06/19 03/05/20  Dustin Flock, MD  Potassium Chloride ER 20 MEQ TBCR Take 20 mEq by mouth 2 (two) times daily. While on lasix' Patient taking differently: Take 20 mEq by mouth 2 (two) times daily.  10/18/18   Gladstone Lighter, MD  rifaximin (XIFAXAN) 550 MG TABS tablet Take 1 tablet (550 mg total) by mouth 2 (two) times daily. 03/06/19   Dustin Flock, MD  traMADol (ULTRAM) 50 MG tablet Take 50 mg by mouth 3 (three) times daily.  12/24/18   [provider]  vitamin C (ASCORBIC ACID) 250 MG tablet Take 500 mg by mouth 2 (two) times daily.    [provider]  zinc sulfate 220 (50 Zn) MG capsule Take 220 mg by mouth daily.    [provider]      VITAL SIGNS:  Blood pressure (!) 171/67, pulse 72, temperature 98.6 F (37 C), temperature source Oral, resp. rate 19, height 6' (1.829 m), weight (!) 140.6 kg, SpO2 99 %.  PHYSICAL EXAMINATION:  GENERAL:  68 y.o.-year-old patient lying in the bed with no acute distress.  EYES: Pupils equal, round, reactive to light and accommodation. No scleral icterus. Extraocular muscles intact.  HEENT: Head atraumatic, normocephalic. Oropharynx and nasopharynx clear.  NECK:  Supple,  no jugular venous distention. No thyroid enlargement, no tenderness.  LUNGS: Normal breath sounds bilaterally, no wheezing, rales,rhonchi or crepitation. No use of accessory muscles of respiration.  CARDIOVASCULAR: S1, S2 normal. No murmurs, rubs, or gallops.  ABDOMEN: Soft, nontender, nondistended. Bowel sounds present. No organomegaly or mass.  EXTREMITIES: 3+ pedal edema, no cyanosis, or clubbing.  NEUROLOGIC: Cranial nerves II through XII are intact. Muscle strength 5/5 in all extremities. Sensation intact. Gait not checked.  PSYCHIATRIC: The patient is alert and oriented x 3.  SKIN: Chronic lower extremity skin ulcerations and redness  LABORATORY PANEL:   CBC Recent Labs  Lab 04/28/19 2117  WBC 4.1  HGB 8.6*  HCT 25.9*  PLT 103*   ------------------------------------------------------------------------------------------------------------------  Chemistries  Recent Labs  Lab 04/28/19 2117  NA 140  K 3.7  CL 103  CO2 26  GLUCOSE 492*  BUN 41*  CREATININE 2.58*  CALCIUM 8.8*  AST 26  ALT 19  ALKPHOS 69  BILITOT 0.6   ------------------------------------------------------------------------------------------------------------------    RADIOLOGY:  Ct Head Wo Contrast  Result Date: 04/28/2019 CLINICAL DATA:  Altered mental status EXAM: CT HEAD WITHOUT CONTRAST TECHNIQUE: Contiguous axial images were obtained from the base of the skull through the vertex without intravenous contrast. COMPARISON:  04/12/2019 FINDINGS: Brain: No evidence of acute infarction, hemorrhage, hydrocephalus, extra-axial collection or mass lesion/mass effect. Vascular: No hyperdense vessel or unexpected calcification. Skull: Normal. Negative for fracture or focal lesion. Sinuses/Orbits: The visualized paranasal sinuses are essentially clear. The mastoid air cells are unopacified. Other: None. IMPRESSION: Normal head CT. Electronically Signed   By: Julian Hy M.D.   On: 04/28/2019 22:19   Dg  Chest Portable 1 View  Result Date: 04/28/2019 CLINICAL DATA:  Altered mental status EXAM: PORTABLE CHEST 1 VIEW COMPARISON:  04/17/2019 FINDINGS: Cardiomegaly with mild central congestion. No focal airspace disease or pleural effusion. No pneumothorax. IMPRESSION: Low lung volumes with cardiomegaly and mild central congestion. No focal airspace disease. Electronically Signed   By: Donavan Foil M.D.   On: 04/28/2019 21:48    EKG:   Sinus rhythm 76 bpm intraventricular conduction delay  IMPRESSION AND PLAN:   1.  Hepatic encephalopathy with history of liver cirrhosis.  Give lactulose 45 g once and 30 g every 4 hours until he reaches 3 bowel movements and can cut back the dose.  Give Xifaxan 550 mg twice a day.  Get an ultrasound to see if there is ascites to draw off for possibility of SBP. 2.  Chronic anemia with numerous GI bleeds in the past.  Hemoglobin seems stable to what it was previously at 8.6. 3.  Chronic kidney disease.  Careful with tramadol and Ativan.  Gentle IV fluids. 4.  Morbid obesity with a BMI of 42.04.  Weight loss needed 5.  Thrombocytopenia.  Follows with oncology as outpatient likely this is from liver cirrhosis 6.  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  Unclear if infection or not.  Empiric Rocephin for now. 7.  Type 2 diabetes mellitus.  Put on sliding scale and continue 70/30 insulin   All the records are reviewed and case discussed with ED provider. Management plans discussed with the patient, and he is in agreement.  CODE STATUS: full code  TOTAL TIME TAKING CARE OF THIS PATIENT: 50 minutes.    Loletha Grayer M.D on 04/28/2019 at 10:57 PM  Between 7am to 6pm - Pager - [phone]  After 6pm call admission pager [phone]  [facility]  [phone]  CC: Primary care physician; Baxter Hire, MD

## 2019-04-28 NOTE — ED Triage Notes (Signed)
Patient presents with self-reported altered mental status, picked up by EMS for "feeling in and out of it". Patient lives alone at home, appears disheveled and soiled. CBG 466

## 2019-04-28 NOTE — ED Notes (Signed)
ED TO INPATIENT HANDOFF REPORT  ED Nurse Name and Phone #:  Anson Crofts Name/Age/Gender Corey West 68 y.o. male Room/Bed: ED26A/ED26A  Code Status   Code Status: Full Code  Home/SNF/Other Home Patient oriented to: self, place, time and situation Is this baseline? yes but he is increasing forgetful and confused at times  Triage Complete: Triage complete  Chief Complaint AMS  Triage Note Patient presents with self-reported altered mental status, picked up by EMS for "feeling in and out of it". Patient lives alone at home, appears disheveled and soiled. CBG 466   Allergies No Known Allergies  Level of Care/Admitting Diagnosis ED Disposition    ED Disposition Condition [facility]: [place] [[id number]]  Level of Care: Med-Surg [16]  Covid Evaluation: Asymptomatic Screening Protocol (No Symptoms)  Diagnosis: Hepatic encephalopathy ([name]) [572.2.ICD-9-CM]  Admitting Physician: Corey West [[id number]]  Attending Physician: Corey West [[id number]]  Estimated length of stay: past midnight tomorrow  Certification:: I certify this patient will need inpatient services for at least 2 midnights  PT Class (Do Not Modify): Inpatient [101]  PT Acc Code (Do Not Modify): Private [1]       B Medical/Surgery History Past Medical History:  Diagnosis Date  . Anemia   . Anxiety    controlled;   . Arthritis   . AVM (arteriovenous malformation) of stomach, acquired with hemorrhage   . Barrett's esophagus   . Chronic kidney disease    renal infufficiency  . Cirrhosis ([place])   . Depression    controlled;   Marland Kitchen Diabetes mellitus without complication ([place])    not controlled, taking insulin but sugar continues to run high;   . Edema   . Esophageal varices ([place])   . GAVE (gastric antral vascular ectasia)   . GERD (gastroesophageal reflux disease)   . History of hiatal hernia   . Hyperlipidemia   . Hypertension    controlled well;   . Liver  cirrhosis ([place])   . Nephropathy, diabetic ([place])   . Obesity   . Pancytopenia ([place])   . Polyp, stomach    with chronic blood loss  . Sleep apnea    does not wear a cpap, Medicare would not pay for it   . Venous stasis dermatitis of both lower extremities   . Venous stasis of both lower extremities    with cellulitis   Past Surgical History:  Procedure Laterality Date  . ESOPHAGOGASTRODUODENOSCOPY N/A 02/09/2015   Procedure: ESOPHAGOGASTRODUODENOSCOPY (EGD);  Surgeon: Manya Silvas, MD;  Location: [facility] ENDOSCOPY;  Service: Endoscopy;  Laterality: N/A;  . ESOPHAGOGASTRODUODENOSCOPY N/A 07/22/2015   Procedure: ESOPHAGOGASTRODUODENOSCOPY (EGD);  Surgeon: Manya Silvas, MD;  Location: [facility] ENDOSCOPY;  Service: Endoscopy;  Laterality: N/A;  . ESOPHAGOGASTRODUODENOSCOPY N/A 06/26/2017   Procedure: ESOPHAGOGASTRODUODENOSCOPY (EGD);  Surgeon: Manya Silvas, MD;  Location: [facility] ENDOSCOPY;  Service: Endoscopy;  Laterality: N/A;  . ESOPHAGOGASTRODUODENOSCOPY N/A 06/27/2017   Procedure: ESOPHAGOGASTRODUODENOSCOPY (EGD);  Surgeon: Manya Silvas, MD;  Location: [facility] ENDOSCOPY;  Service: Endoscopy;  Laterality: N/A;  . ESOPHAGOGASTRODUODENOSCOPY N/A 06/28/2017   Procedure: ESOPHAGOGASTRODUODENOSCOPY (EGD);  Surgeon: Manya Silvas, MD;  Location: [facility] ENDOSCOPY;  Service: Endoscopy;  Laterality: N/A;  . ESOPHAGOGASTRODUODENOSCOPY Bilateral 10/11/2017   Procedure: ESOPHAGOGASTRODUODENOSCOPY (EGD);  Surgeon: Manya Silvas, MD;  Location: [facility] ENDOSCOPY;  Service: Endoscopy;  Laterality: Bilateral;  . ESOPHAGOGASTRODUODENOSCOPY N/A 12/04/2017   Procedure: ESOPHAGOGASTRODUODENOSCOPY (EGD);  Surgeon: Manya Silvas, MD;  Location: [facility] ENDOSCOPY;  Service:  Endoscopy;  Laterality: N/A;  . ESOPHAGOGASTRODUODENOSCOPY N/A 03/06/2019   Procedure: ESOPHAGOGASTRODUODENOSCOPY (EGD);  Surgeon: [place], Benay Pike, MD;  Location: [facility] ENDOSCOPY;  Service: Gastroenterology;  Laterality: N/A;  .  ESOPHAGOGASTRODUODENOSCOPY (EGD) WITH PROPOFOL N/A 07/20/2015   Procedure: ESOPHAGOGASTRODUODENOSCOPY (EGD) WITH PROPOFOL;  Surgeon: Manya Silvas, MD;  Location: [facility] ENDOSCOPY;  Service: Endoscopy;  Laterality: N/A;  . ESOPHAGOGASTRODUODENOSCOPY (EGD) WITH PROPOFOL N/A 09/16/2015   Procedure: ESOPHAGOGASTRODUODENOSCOPY (EGD) WITH PROPOFOL;  Surgeon: Manya Silvas, MD;  Location: [facility] ENDOSCOPY;  Service: Endoscopy;  Laterality: N/A;  . ESOPHAGOGASTRODUODENOSCOPY (EGD) WITH PROPOFOL N/A 03/16/2016   Procedure: ESOPHAGOGASTRODUODENOSCOPY (EGD) WITH PROPOFOL;  Surgeon: Manya Silvas, MD;  Location: [facility] ENDOSCOPY;  Service: Endoscopy;  Laterality: N/A;  . ESOPHAGOGASTRODUODENOSCOPY (EGD) WITH PROPOFOL N/A 09/14/2016   Procedure: ESOPHAGOGASTRODUODENOSCOPY (EGD) WITH PROPOFOL;  Surgeon: Manya Silvas, MD;  Location: [facility] ENDOSCOPY;  Service: Endoscopy;  Laterality: N/A;  . ESOPHAGOGASTRODUODENOSCOPY (EGD) WITH PROPOFOL N/A 11/05/2016   Procedure: ESOPHAGOGASTRODUODENOSCOPY (EGD) WITH PROPOFOL;  Surgeon: Manya Silvas, MD;  Location: [facility] ENDOSCOPY;  Service: Endoscopy;  Laterality: N/A;  . ESOPHAGOGASTRODUODENOSCOPY (EGD) WITH PROPOFOL N/A 02/06/2017   Procedure: ESOPHAGOGASTRODUODENOSCOPY (EGD) WITH PROPOFOL;  Surgeon: Manya Silvas, MD;  Location: [facility] ENDOSCOPY;  Service: Endoscopy;  Laterality: N/A;  . ESOPHAGOGASTRODUODENOSCOPY (EGD) WITH PROPOFOL N/A 04/24/2017   Procedure: ESOPHAGOGASTRODUODENOSCOPY (EGD) WITH PROPOFOL;  Surgeon: Manya Silvas, MD;  Location: [facility] ENDOSCOPY;  Service: Endoscopy;  Laterality: N/A;  . ESOPHAGOGASTRODUODENOSCOPY (EGD) WITH PROPOFOL N/A 07/31/2017   Procedure: ESOPHAGOGASTRODUODENOSCOPY (EGD) WITH PROPOFOL;  Surgeon: Manya Silvas, MD;  Location: [facility] ENDOSCOPY;  Service: Endoscopy;  Laterality: N/A;  . ESOPHAGOGASTRODUODENOSCOPY (EGD) WITH PROPOFOL N/A 08/08/2018   Procedure: ESOPHAGOGASTRODUODENOSCOPY (EGD) WITH PROPOFOL;  Surgeon: Manya Silvas, MD;  Location: [facility] ENDOSCOPY;  Service: Endoscopy;  Laterality: N/A;  . GIVENS CAPSULE STUDY N/A 12/05/2017   Procedure: GIVENS CAPSULE STUDY;  Surgeon: Manya Silvas, MD;  Location: [facility] ENDOSCOPY;  Service: Endoscopy;  Laterality: N/A;  . TONSILLECTOMY    . TONSILLECTOMY AND ADENOIDECTOMY    . ULNAR NERVE TRANSPOSITION    . UVULOPALATOPHARYNGOPLASTY       A IV Location/Drains/Wounds Patient Lines/Drains/Airways Status   Active Line/Drains/Airways    Name:   Placement date:   Placement time:   Site:   Days:   Wound / Incision (Open or Dehisced) 01/15/19 Venous stasis ulcer Leg Right Scabbed, white unopened area   01/15/19    0131    Leg   103   Wound / Incision (Open or Dehisced) 01/15/19 Venous stasis ulcer Leg Left white unopened area   01/15/19    0232    Leg   103          Intake/Output Last 24 hours No intake or output data in the 24 hours ending 04/28/19 2303  Labs/Imaging Results for orders placed or performed during the hospital encounter of 04/28/19 (from the past 48 hour(s))  Glucose, capillary     Status: Abnormal   Collection Time: 04/28/19  9:11 PM  Result Value Ref Range   Glucose-Capillary 466 (H) 70 - 99 mg/dL  CBC with Differential/Platelet     Status: Abnormal   Collection Time: 04/28/19  9:17 PM  Result Value Ref Range   WBC 4.1 4.0 - 10.5 K/uL   RBC 2.79 (L) 4.22 - 5.81 MIL/uL   Hemoglobin 8.6 (L) 13.0 - 17.0 g/dL   HCT 25.9 (L) 39.0 - 52.0 %   MCV 92.8 80.0 - 100.0 fL  MCH 30.8 26.0 - 34.0 pg   MCHC 33.2 30.0 - 36.0 g/dL   RDW 15.2 11.5 - 15.5 %   Platelets 103 (L) 150 - 400 K/uL    Comment: Immature Platelet Fraction may be clinically indicated, consider ordering this additional test [id number]    nRBC 0.0 0.0 - 0.2 %   Neutrophils Relative % 72 %   Neutro Abs 3.0 1.7 - 7.7 K/uL   Lymphocytes Relative 18 %   Lymphs Abs 0.7 0.7 - 4.0 K/uL   Monocytes Relative 10 %   Monocytes Absolute 0.4 0.1 - 1.0 K/uL   Eosinophils Relative 0 %    Eosinophils Absolute 0.0 0.0 - 0.5 K/uL   Basophils Relative 0 %   Basophils Absolute 0.0 0.0 - 0.1 K/uL   Immature Granulocytes 0 %   Abs Immature Granulocytes 0.01 0.00 - 0.07 K/uL    Comment: Performed at [facility], [address] [place], [place] [address]  Comprehensive metabolic panel     Status: Abnormal   Collection Time: 04/28/19  9:17 PM  Result Value Ref Range   Sodium 140 135 - 145 mmol/L   Potassium 3.7 3.5 - 5.1 mmol/L   Chloride 103 98 - 111 mmol/L   CO2 26 22 - 32 mmol/L   Glucose, Bld 492 (H) 70 - 99 mg/dL   BUN 41 (H) 8 - 23 mg/dL   Creatinine, Ser 2.58 (H) 0.61 - 1.24 mg/dL   Calcium 8.8 (L) 8.9 - 10.3 mg/dL   Total Protein 6.7 6.5 - 8.1 g/dL   Albumin 2.9 (L) 3.5 - 5.0 g/dL   AST 26 15 - 41 U/L   ALT 19 0 - 44 U/L   Alkaline Phosphatase 69 38 - 126 U/L   Total Bilirubin 0.6 0.3 - 1.2 mg/dL   GFR calc non Af Amer 25 (L) >60 mL/min   GFR calc Af Amer 29 (L) >60 mL/min   Anion gap 11 5 - 15    Comment: Performed at [facility], [address] [place], [place] [address]  Lipase, blood     Status: None   Collection Time: 04/28/19  9:17 PM  Result Value Ref Range   Lipase 41 11 - 51 U/L    Comment: Performed at [facility], [address] [place], [place] [address]  Ammonia     Status: Abnormal   Collection Time: 04/28/19  9:17 PM  Result Value Ref Range   Ammonia 143 (H) 9 - 35 umol/L    Comment: Performed at [facility], [address] [place], [place] [address]  SARS Coronavirus 2 (CEPHEID- Performed in [place] hospital lab), Hosp Order     Status: None   Collection Time: 04/28/19  9:34 PM   Specimen: Nasopharyngeal Swab  Result Value Ref Range   SARS Coronavirus 2 NEGATIVE NEGATIVE    Comment: (NOTE) If result is NEGATIVE SARS-CoV-2 target nucleic acids are NOT DETECTED. The SARS-CoV-2 RNA is generally detectable in upper and lower  respiratory specimens during the acute phase of infection. The  lowest  concentration of SARS-CoV-2 viral copies this assay can detect is 250  copies / mL. A negative result does not preclude SARS-CoV-2 infection  and should not be used as the sole basis for treatment or other  patient management decisions.  A negative result may occur with  improper specimen collection / handling, submission of specimen other  than nasopharyngeal swab, presence of viral mutation(s) within the  areas targeted by this assay, and inadequate number of viral copies  (<250 copies / mL). A negative result must be combined with clinical  observations, patient history, and epidemiological information. If result is POSITIVE SARS-CoV-2 target nucleic acids are DETECTED. The SARS-CoV-2 RNA is generally detectable in upper and lower  respiratory specimens dur ing the acute phase of infection.  Positive  results are indicative of active infection with SARS-CoV-2.  Clinical  correlation with patient history and other diagnostic information is  necessary to determine patient infection status.  Positive results do  not rule out bacterial infection or co-infection with other viruses. If result is PRESUMPTIVE POSTIVE SARS-CoV-2 nucleic acids MAY BE PRESENT.   A presumptive positive result was obtained on the submitted specimen  and confirmed on repeat testing.  While 2019 novel coronavirus  (SARS-CoV-2) nucleic acids may be present in the submitted sample  additional confirmatory testing may be necessary for epidemiological  and / or clinical management purposes  to differentiate between  SARS-CoV-2 and other Sarbecovirus currently known to infect humans.  If clinically indicated additional testing with an alternate test  methodology [phone]) is advised. The SARS-CoV-2 RNA is generally  detectable in upper and lower respiratory sp ecimens during the acute  phase of infection. The expected result is Negative. Fact Sheet for Patients:   StrictlyIdeas.no Fact Sheet for Healthcare Providers: BankingDealers.co.za This test is not yet approved or cleared by the [place] FDA and has been authorized for detection and/or diagnosis of SARS-CoV-2 by FDA under an Emergency Use Authorization (EUA).  This EUA will remain in effect (meaning this test can be used) for the duration of the COVID-19 declaration under Section 564(b)(1) of the Act, 21 U.S.C. section 360bbb-3(b)(1), unless the authorization is terminated or revoked sooner. Performed at [facility], [place], [place] [address]    Ct Head Wo Contrast  Result Date: 04/28/2019 CLINICAL DATA:  Altered mental status EXAM: CT HEAD WITHOUT CONTRAST TECHNIQUE: Contiguous axial images were obtained from the base of the skull through the vertex without intravenous contrast. COMPARISON:  04/12/2019 FINDINGS: Brain: No evidence of acute infarction, hemorrhage, hydrocephalus, extra-axial collection or mass lesion/mass effect. Vascular: No hyperdense vessel or unexpected calcification. Skull: Normal. Negative for fracture or focal lesion. Sinuses/Orbits: The visualized paranasal sinuses are essentially clear. The mastoid air cells are unopacified. Other: None. IMPRESSION: Normal head CT. Electronically Signed   By: Julian Hy M.D.   On: 04/28/2019 22:19   Dg Chest Portable 1 View  Result Date: 04/28/2019 CLINICAL DATA:  Altered mental status EXAM: PORTABLE CHEST 1 VIEW COMPARISON:  04/17/2019 FINDINGS: Cardiomegaly with mild central congestion. No focal airspace disease or pleural effusion. No pneumothorax. IMPRESSION: Low lung volumes with cardiomegaly and mild central congestion. No focal airspace disease. Electronically Signed   By: Donavan Foil M.D.   On: 04/28/2019 21:48    Pending Labs Unresulted Labs (From admission, onward)    Start     Ordered   04/29/19 7262  Basic metabolic panel  Tomorrow  morning,   STAT     04/28/19 2254   04/29/19 0500  CBC  Tomorrow morning,   STAT     04/28/19 2254          Vitals/Pain Today's Vitals   04/28/19 2108 04/28/19 2111 04/28/19 2112 04/28/19 2114  BP:  (!) 171/67    Pulse:  72    Resp:  19    Temp:  98.6 F (37 C)  TempSrc:  Oral    SpO2: 97% 99%    Weight:    (!) 140.6 kg  Height:    6' (1.829 m)  PainSc:   0-No pain     Isolation Precautions No active isolations  Medications Medications  lactulose (CHRONULAC) 10 GM/15ML solution 45 g (has no administration in time range)  lactulose (CHRONULAC) 10 GM/15ML solution 30 g (has no administration in time range)  rifaximin (XIFAXAN) tablet 550 mg (has no administration in time range)  acetaminophen (TYLENOL) tablet 650 mg (has no administration in time range)    Or  acetaminophen (TYLENOL) suppository 650 mg (has no administration in time range)  ondansetron (ZOFRAN) tablet 4 mg (has no administration in time range)    Or  ondansetron (ZOFRAN) injection 4 mg (has no administration in time range)  atorvastatin (LIPITOR) tablet 40 mg (has no administration in time range)  furosemide (LASIX) tablet 40 mg (has no administration in time range)  LORazepam (ATIVAN) tablet 0.5 mg (has no administration in time range)  insulin aspart protamine - aspart (NOVOLOG 70/30 MIX) FlexPen 60 Units (has no administration in time range)  pantoprazole (PROTONIX) EC tablet 40 mg (has no administration in time range)  ferrous sulfate tablet 325 mg (has no administration in time range)  multivitamin tablet 1 tablet (has no administration in time range)  Potassium Chloride ER TBCR 20 mEq (has no administration in time range)  vitamin C (ASCORBIC ACID) tablet 500 mg (has no administration in time range)  zinc sulfate capsule 220 mg (has no administration in time range)  loratadine (CLARITIN) tablet 10 mg (has no administration in time range)    Mobility walks with device High fall risk    Focused Assessments Neuro Assessment Handoff:   Cardiac Rhythm: Normal sinus rhythm       Neuro Assessment:   Neuro Checks:      Last Documented NIHSS Modified Score:        R Recommendations: See Admitting Provider Note  Report given to:   Additional Notes:

## 2019-04-28 NOTE — ED Provider Notes (Signed)
[facility] Emergency Department Provider Note    First MD Initiated Contact with Patient 04/28/19 2107     (approximate)  I have reviewed the triage vital signs and the nursing notes.   HISTORY  Chief Complaint Altered Mental Status    HPI Corey West is a 68 y.o. male   below listed past medical history presents the ER for evaluation of confusion.  Patient was actually seen in outpatient clinic today but since then became increasingly confused.  He is amnestic to his visit earlier today.  States he did have a fall 2 days ago and did hit his head.  Patient appears very different from the last time I saw him.  Certainly more encephalopathic more confused.  Denies any pain, Nausea, fevers, sob, or cough.   Past Medical History:  Diagnosis Date  . Anemia   . Anxiety    controlled;   . Arthritis   . AVM (arteriovenous malformation) of stomach, acquired with hemorrhage   . Barrett's esophagus   . Chronic kidney disease    renal infufficiency  . Cirrhosis ([place])   . Depression    controlled;   Marland Kitchen Diabetes mellitus without complication ([place])    not controlled, taking insulin but sugar continues to run high;   . Edema   . Esophageal varices ([place])   . GAVE (gastric antral vascular ectasia)   . GERD (gastroesophageal reflux disease)   . History of hiatal hernia   . Hyperlipidemia   . Hypertension    controlled well;   . Liver cirrhosis ([place])   . 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 Obesity   . Pancytopenia ([place])   . Polyp, stomach    with chronic blood loss  . Sleep apnea    does not wear a cpap, Medicare would not pay for it   . Venous stasis dermatitis of both lower extremities   . Venous stasis of both lower extremities    with cellulitis   Family History  Problem Relation Age of Onset  . Diabetes Other   . Transient ischemic attack Father   . CAD Father    Past Surgical History:  Procedure Laterality Date  . ESOPHAGOGASTRODUODENOSCOPY N/A  02/09/2015   Procedure: ESOPHAGOGASTRODUODENOSCOPY (EGD);  Surgeon: Manya Silvas, MD;  Location: [facility] ENDOSCOPY;  Service: Endoscopy;  Laterality: N/A;  . ESOPHAGOGASTRODUODENOSCOPY N/A 07/22/2015   Procedure: ESOPHAGOGASTRODUODENOSCOPY (EGD);  Surgeon: Manya Silvas, MD;  Location: [facility] ENDOSCOPY;  Service: Endoscopy;  Laterality: N/A;  . ESOPHAGOGASTRODUODENOSCOPY N/A 06/26/2017   Procedure: ESOPHAGOGASTRODUODENOSCOPY (EGD);  Surgeon: Manya Silvas, MD;  Location: [facility] ENDOSCOPY;  Service: Endoscopy;  Laterality: N/A;  . ESOPHAGOGASTRODUODENOSCOPY N/A 06/27/2017   Procedure: ESOPHAGOGASTRODUODENOSCOPY (EGD);  Surgeon: Manya Silvas, MD;  Location: [facility] ENDOSCOPY;  Service: Endoscopy;  Laterality: N/A;  . ESOPHAGOGASTRODUODENOSCOPY N/A 06/28/2017   Procedure: ESOPHAGOGASTRODUODENOSCOPY (EGD);  Surgeon: Manya Silvas, MD;  Location: [facility] ENDOSCOPY;  Service: Endoscopy;  Laterality: N/A;  . ESOPHAGOGASTRODUODENOSCOPY Bilateral 10/11/2017   Procedure: ESOPHAGOGASTRODUODENOSCOPY (EGD);  Surgeon: Manya Silvas, MD;  Location: [facility] ENDOSCOPY;  Service: Endoscopy;  Laterality: Bilateral;  . ESOPHAGOGASTRODUODENOSCOPY N/A 12/04/2017   Procedure: ESOPHAGOGASTRODUODENOSCOPY (EGD);  Surgeon: Manya Silvas, MD;  Location: [facility] ENDOSCOPY;  Service: Endoscopy;  Laterality: N/A;  . ESOPHAGOGASTRODUODENOSCOPY N/A 03/06/2019   Procedure: ESOPHAGOGASTRODUODENOSCOPY (EGD);  Surgeon: [place], Benay Pike, MD;  Location: [facility] ENDOSCOPY;  Service: Gastroenterology;  Laterality: N/A;  . ESOPHAGOGASTRODUODENOSCOPY (EGD) WITH PROPOFOL N/A 07/20/2015   Procedure: ESOPHAGOGASTRODUODENOSCOPY (EGD) WITH  PROPOFOL;  Surgeon: Manya Silvas, MD;  Location: [facility] ENDOSCOPY;  Service: Endoscopy;  Laterality: N/A;  . ESOPHAGOGASTRODUODENOSCOPY (EGD) WITH PROPOFOL N/A 09/16/2015   Procedure: ESOPHAGOGASTRODUODENOSCOPY (EGD) WITH PROPOFOL;  Surgeon: Manya Silvas, MD;  Location: [facility] ENDOSCOPY;  Service:  Endoscopy;  Laterality: N/A;  . ESOPHAGOGASTRODUODENOSCOPY (EGD) WITH PROPOFOL N/A 03/16/2016   Procedure: ESOPHAGOGASTRODUODENOSCOPY (EGD) WITH PROPOFOL;  Surgeon: Manya Silvas, MD;  Location: [facility] ENDOSCOPY;  Service: Endoscopy;  Laterality: N/A;  . ESOPHAGOGASTRODUODENOSCOPY (EGD) WITH PROPOFOL N/A 09/14/2016   Procedure: ESOPHAGOGASTRODUODENOSCOPY (EGD) WITH PROPOFOL;  Surgeon: Manya Silvas, MD;  Location: [facility] ENDOSCOPY;  Service: Endoscopy;  Laterality: N/A;  . ESOPHAGOGASTRODUODENOSCOPY (EGD) WITH PROPOFOL N/A 11/05/2016   Procedure: ESOPHAGOGASTRODUODENOSCOPY (EGD) WITH PROPOFOL;  Surgeon: Manya Silvas, MD;  Location: [facility] ENDOSCOPY;  Service: Endoscopy;  Laterality: N/A;  . ESOPHAGOGASTRODUODENOSCOPY (EGD) WITH PROPOFOL N/A 02/06/2017   Procedure: ESOPHAGOGASTRODUODENOSCOPY (EGD) WITH PROPOFOL;  Surgeon: Manya Silvas, MD;  Location: [facility] ENDOSCOPY;  Service: Endoscopy;  Laterality: N/A;  . ESOPHAGOGASTRODUODENOSCOPY (EGD) WITH PROPOFOL N/A 04/24/2017   Procedure: ESOPHAGOGASTRODUODENOSCOPY (EGD) WITH PROPOFOL;  Surgeon: Manya Silvas, MD;  Location: [facility] ENDOSCOPY;  Service: Endoscopy;  Laterality: N/A;  . ESOPHAGOGASTRODUODENOSCOPY (EGD) WITH PROPOFOL N/A 07/31/2017   Procedure: ESOPHAGOGASTRODUODENOSCOPY (EGD) WITH PROPOFOL;  Surgeon: Manya Silvas, MD;  Location: [facility] ENDOSCOPY;  Service: Endoscopy;  Laterality: N/A;  . ESOPHAGOGASTRODUODENOSCOPY (EGD) WITH PROPOFOL N/A 08/08/2018   Procedure: ESOPHAGOGASTRODUODENOSCOPY (EGD) WITH PROPOFOL;  Surgeon: Manya Silvas, MD;  Location: [facility] ENDOSCOPY;  Service: Endoscopy;  Laterality: N/A;  . GIVENS CAPSULE STUDY N/A 12/05/2017   Procedure: GIVENS CAPSULE STUDY;  Surgeon: Manya Silvas, MD;  Location: [facility] ENDOSCOPY;  Service: Endoscopy;  Laterality: N/A;  . TONSILLECTOMY    . TONSILLECTOMY AND ADENOIDECTOMY    . ULNAR NERVE TRANSPOSITION    . UVULOPALATOPHARYNGOPLASTY     Patient Active Problem List   Diagnosis  Date Noted  . HCAP (healthcare-associated pneumonia) 02/03/2019  . Aphasia 01/14/2019  . Acute on chronic renal failure ([place]) 01/14/2019  . Malnutrition of moderate degree 12/03/2018  . C. difficile diarrhea 12/02/2018  . Ankle fracture, left, closed, initial encounter 10/15/2018  . Ankle fracture, right, closed, initial encounter 10/15/2018  . Ankle fracture 10/15/2018  . Pressure injury of skin 07/19/2018  . UTI (urinary tract infection) 07/18/2018  . CKD (chronic kidney disease) stage 3, GFR 30-59 ml/min (HCC) 01/02/2018  . 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 Goals of care, counseling/discussion   . Palliative care encounter   . Hepatic encephalopathy syndrome ([place]) 12/16/2017  . Hepatic encephalopathy ([place]) 10/18/2017  . Hypokalemia 10/09/2017  . Hyperglycemia 10/09/2017  . Symptomatic anemia 06/26/2017  . Sepsis ([place]) 06/12/2017  . Type 2 diabetes mellitus with diabetic nephropathy, with long-term current use of insulin ([place]) 05/29/2017  . Cellulitis in diabetic foot ([place]) 02/21/2017  . Cellulitis 02/21/2017  . GI bleed 07/20/2015  . Hematemesis 02/08/2015  . Cirrhosis ([place]) 02/08/2015  . Esophageal varices ([place]) 02/08/2015  . Gastroesophageal reflux disease 02/08/2015  . Hyperlipidemia 02/08/2015  . Chronic venous stasis dermatitis of both lower extremities 10/06/2014  . Edema 05/25/2014  . Hypertension 05/25/2014  . Obesity 05/25/2014  . Sleep apnea 05/25/2014  . Barrett's esophagus 05/25/2014  . Pancytopenia ([place]) 05/25/2014  . Iron deficiency anemia 12/04/2013  . Thrombocytopenia ([place]) 12/04/2013  . Splenomegaly 11/04/2013      Prior to Admission medications   Medication Sig Start Date End Date Taking? Authorizing Provider  atorvastatin (LIPITOR)  40 MG tablet Take 1 tablet (40 mg total) by mouth daily at 6 PM. 02/05/19   Gouru, Aruna, MD  cetirizine (ZYRTEC) 10 MG tablet Take 10 mg by mouth daily.    [provider]  ferrous sulfate 325  (65 FE) MG tablet Take 325 mg by mouth 2 (two) times daily with a meal.     [provider]  furosemide (LASIX) 40 MG tablet Take 40 mg by mouth 2 (two) times a day.  01/13/19   [provider]  insulin aspart protamine - aspart (NOVOLOG 70/30 MIX) (70-30) 100 UNIT/ML FlexPen Inject 70-84 Units into the skin See admin instructions. Inject 84u under the skin every morning and 70u under the skin every evening    [provider]  lactulose (CHRONULAC) 10 GM/15ML solution Take 30 g by mouth 4 (four) times daily.  12/11/18   [provider]  LORazepam (ATIVAN) 0.5 MG tablet Take 1 tablet (0.5 mg total) by mouth every 8 (eight) hours as needed for anxiety. 10/18/18   Gladstone Lighter, MD  metolazone (ZAROXOLYN) 5 MG tablet Take 5 mg by mouth daily.    [provider]  Multiple Vitamin (MULTIVITAMIN) tablet Take 1 tablet by mouth daily.    [provider]  omeprazole (PRILOSEC) 40 MG capsule Take 40 mg by mouth daily.    [provider]  pantoprazole (PROTONIX) 40 MG tablet Take 1 tablet (40 mg total) by mouth 2 (two) times daily. 03/06/19 03/05/20  Dustin Flock, MD  Potassium Chloride ER 20 MEQ TBCR Take 20 mEq by mouth 2 (two) times daily. While on lasix' Patient taking differently: Take 20 mEq by mouth 2 (two) times daily.  10/18/18   Gladstone Lighter, MD  rifaximin (XIFAXAN) 550 MG TABS tablet Take 1 tablet (550 mg total) by mouth 2 (two) times daily. 03/06/19   Dustin Flock, MD  traMADol (ULTRAM) 50 MG tablet Take 50 mg by mouth 3 (three) times daily.  12/24/18   [provider]  vitamin C (ASCORBIC ACID) 250 MG tablet Take 500 mg by mouth 2 (two) times daily.    [provider]  zinc sulfate 220 (50 Zn) MG capsule Take 220 mg by mouth daily.    [provider]    Allergies Patient has no known allergies.    Social History Social History   Tobacco Use  . Smoking status: Former Smoker    Years: 20.00     Types: Cigars, Cigarettes  . Smokeless tobacco: Never Used  Substance Use Topics  . Alcohol use: No    Comment: stopped 15 years ago  . Drug use: No    Review of Systems Patient denies headaches, rhinorrhea, blurry vision, numbness, shortness of breath, chest pain, edema, cough, abdominal pain, nausea, vomiting, diarrhea, dysuria, fevers, rashes or hallucinations unless otherwise stated above in HPI. ____________________________________________   PHYSICAL EXAM:  VITAL SIGNS: Vitals:   04/28/19 2108 04/28/19 2111  BP:  (!) 171/67  Pulse:  72  Resp:  19  Temp:  98.6 F (37 C)  SpO2: 97% 99%    Constitutional: Alert, confused and disorient to place and time. Eyes: Conjunctivae are normal.  Head: Atraumatic. Nose: No congestion/rhinnorhea. Mouth/Throat: Mucous membranes are moist.   Neck: No stridor. Painless ROM.  Cardiovascular: Normal rate, regular rhythm. Grossly normal heart sounds.  Good peripheral circulation. Respiratory: Normal respiratory effort.  No retractions. Lungs CTAB. Gastrointestinal: Soft and nontender. No distention. No abdominal bruits. No CVA tenderness. Genitourinary:  Musculoskeletal:  chronic venous stasis changes to BLE.   Neurologic:  Normal speech and language. + asterixis. No facial droop Skin:  Skin is warm, dry and intact. No rash noted. Psychiatric: Mood and affect are anxious. Speech and behavior are normal.  ____________________________________________   LABS (all labs ordered are listed, but only abnormal results are displayed)  Results for orders placed or performed during the hospital encounter of 04/28/19 (from the past 24 hour(s))  Glucose, capillary     Status: Abnormal   Collection Time: 04/28/19  9:11 PM  Result Value Ref Range   Glucose-Capillary 466 (H) 70 - 99 mg/dL  CBC with Differential/Platelet     Status: Abnormal   Collection Time: 04/28/19  9:17 PM  Result Value Ref Range   WBC 4.1 4.0 - 10.5 K/uL   RBC 2.79 (L) 4.22  - 5.81 MIL/uL   Hemoglobin 8.6 (L) 13.0 - 17.0 g/dL   HCT 25.9 (L) 39.0 - 52.0 %   MCV 92.8 80.0 - 100.0 fL   MCH 30.8 26.0 - 34.0 pg   MCHC 33.2 30.0 - 36.0 g/dL   RDW 15.2 11.5 - 15.5 %   Platelets 103 (L) 150 - 400 K/uL   nRBC 0.0 0.0 - 0.2 %   Neutrophils Relative % 72 %   Neutro Abs 3.0 1.7 - 7.7 K/uL   Lymphocytes Relative 18 %   Lymphs Abs 0.7 0.7 - 4.0 K/uL   Monocytes Relative 10 %   Monocytes Absolute 0.4 0.1 - 1.0 K/uL   Eosinophils Relative 0 %   Eosinophils Absolute 0.0 0.0 - 0.5 K/uL   Basophils Relative 0 %   Basophils Absolute 0.0 0.0 - 0.1 K/uL   Immature Granulocytes 0 %   Abs Immature Granulocytes 0.01 0.00 - 0.07 K/uL  Comprehensive metabolic panel     Status: Abnormal   Collection Time: 04/28/19  9:17 PM  Result Value Ref Range   Sodium 140 135 - 145 mmol/L   Potassium 3.7 3.5 - 5.1 mmol/L   Chloride 103 98 - 111 mmol/L   CO2 26 22 - 32 mmol/L   Glucose, Bld 492 (H) 70 - 99 mg/dL   BUN 41 (H) 8 - 23 mg/dL   Creatinine, Ser 2.58 (H) 0.61 - 1.24 mg/dL   Calcium 8.8 (L) 8.9 - 10.3 mg/dL   Total Protein 6.7 6.5 - 8.1 g/dL   Albumin 2.9 (L) 3.5 - 5.0 g/dL   AST 26 15 - 41 U/L   ALT 19 0 - 44 U/L   Alkaline Phosphatase 69 38 - 126 U/L   Total Bilirubin 0.6 0.3 - 1.2 mg/dL   GFR calc non Af Amer 25 (L) >60 mL/min   GFR calc Af Amer 29 (L) >60 mL/min   Anion gap 11 5 - 15  Lipase, blood     Status: None   Collection Time: 04/28/19  9:17 PM  Result Value Ref Range   Lipase 41 11 - 51 U/L  Ammonia     Status: Abnormal   Collection Time: 04/28/19  9:17 PM  Result Value Ref Range   Ammonia 143 (H) 9 - 35 umol/L   ____________________________________________  EKG My review and personal interpretation at Time: 21:14   Indication: ams  Rate: 75  Rhythm: sinus Axis: normal Other: motion artifact in V3./  No stemi.  abnml ekg ____________________________________________  RADIOLOGY  I personally reviewed all radiographic images ordered to evaluate for  the above acute complaints and reviewed radiology reports and  findings.  These findings were personally discussed with the patient.  Please see medical record for radiology report.  ____________________________________________   PROCEDURES  Procedure(s) performed:  .Critical Care Performed by: Merlyn Lot, MD Authorized by: Merlyn Lot, MD   Critical care provider statement:    Critical care time (minutes):  30   Critical care time was exclusive of:  Separately billable procedures and treating other patients   Critical care was necessary to treat or prevent imminent or life-threatening deterioration of the following conditions:  CNS failure or compromise   Critical care was time spent personally by me on the following activities:  Development of treatment plan with patient or surrogate, discussions with consultants, evaluation of patient's response to treatment, examination of patient, obtaining history from patient or surrogate, ordering and performing treatments and interventions, ordering and review of laboratory studies, ordering and review of radiographic studies, pulse oximetry, re-evaluation of patient's condition and review of old charts      Critical Care performed: yes  ____________________________________________   INITIAL IMPRESSION / [place] / ED COURSE  Pertinent labs & imaging results that were available during my care of the patient were reviewed by me and considered in my medical decision making (see chart for details).   DDX: Dehydration, sepsis, pna, uti, hypoglycemia, cva, drug effect, withdrawal, encephalitis   MICAEL BARB is a 68 y.o. who presents to the ED with confusion altered mental status as described above.  Have a high suspicion this is secondary to underlying hepatic encephalopathy given his asterixis.  Will order lactulose.  Will check blood work for the above differential.  Will order CT imaging given his report of fall surely  does not have any evidence of subdural or bleed.  Anticipate patient will require admission to the hospital.     The patient was evaluated in Emergency Department today for the symptoms described in the history of present illness. He/she was evaluated in the context of the global COVID-19 pandemic, which necessitated consideration that the patient might be at risk for infection with the SARS-CoV-2 virus that causes COVID-19. Institutional protocols and algorithms that pertain to the evaluation of patients at risk for COVID-19 are in a state of rapid change based on information released by regulatory bodies including the CDC and federal and state organizations. These policies and algorithms were followed during the patient's care in the ED.  As part of my medical decision making, I reviewed the following data within the [place] notes reviewed and incorporated, Labs reviewed, notes from prior ED visits and [place] Controlled Substance Database   ____________________________________________   FINAL CLINICAL IMPRESSION(S) / ED DIAGNOSES  Final diagnoses:  Hepatic encephalopathy ([place])  Altered mental status, unspecified altered mental status type      NEW MEDICATIONS STARTED DURING THIS VISIT:  New Prescriptions   No medications on file     Note:  This document was prepared using Dragon voice recognition software and may include unintentional dictation errors.    Merlyn Lot, MD 04/28/19 2211

## 2019-04-29 ENCOUNTER — Other Ambulatory Visit: Payer: Self-pay

## 2019-04-29 ENCOUNTER — Inpatient Hospital Stay: Payer: Medicare Other

## 2019-04-29 DIAGNOSIS — K7290 Hepatic failure, unspecified without coma: Secondary | ICD-10-CM | POA: Diagnosis not present

## 2019-04-29 LAB — GLUCOSE, CAPILLARY
Glucose-Capillary: 361 mg/dL — ABNORMAL HIGH (ref 70–99)
Glucose-Capillary: 272 mg/dL — ABNORMAL HIGH (ref 70–99)
Glucose-Capillary: 248 mg/dL — ABNORMAL HIGH (ref 70–99)

## 2019-04-29 LAB — CBC
HCT: 23.8 % — ABNORMAL LOW (ref 39.0–52.0)
Hemoglobin: 7.9 g/dL — ABNORMAL LOW (ref 13.0–17.0)
MCH: 31 pg (ref 26.0–34.0)
MCHC: 33.2 g/dL (ref 30.0–36.0)
MCV: 93.3 fL (ref 80.0–100.0)
Platelets: 91 10*3/uL — ABNORMAL LOW (ref 150–400)
RBC: 2.55 MIL/uL — ABNORMAL LOW (ref 4.22–5.81)
RDW: 15.1 % (ref 11.5–15.5)
WBC: 4 10*3/uL (ref 4.0–10.5)
nRBC: 0 % (ref 0.0–0.2)

## 2019-04-29 LAB — BASIC METABOLIC PANEL
Anion gap: 8 (ref 5–15)
BUN: 40 mg/dL — ABNORMAL HIGH (ref 8–23)
CO2: 27 mmol/L (ref 22–32)
Calcium: 8.8 mg/dL — ABNORMAL LOW (ref 8.9–10.3)
Chloride: 107 mmol/L (ref 98–111)
Creatinine, Ser: 2.51 mg/dL — ABNORMAL HIGH (ref 0.61–1.24)
GFR calc Af Amer: 30 mL/min — ABNORMAL LOW (ref >60)
GFR calc non Af Amer: 25 mL/min — ABNORMAL LOW (ref >60)
Glucose, Bld: 362 mg/dL — ABNORMAL HIGH (ref 70–99)
Potassium: 3.9 mmol/L (ref 3.5–5.1)
Sodium: 142 mmol/L (ref 135–145)

## 2019-04-29 LAB — HEMOGLOBIN A1C
Hgb A1c MFr Bld: 7.5 % — ABNORMAL HIGH (ref 4.8–5.6)
Mean Plasma Glucose: 168.55 mg/dL

## 2019-04-29 LAB — AMMONIA: Ammonia: 75 umol/L — ABNORMAL HIGH (ref 9–35)

## 2019-04-29 MED ORDER — RIFAXIMIN 550 MG PO TABS: 550.0000 mg | ORAL_TABLET | Freq: Two times a day (BID) | ORAL | 0 refills | Status: DC

## 2019-04-29 NOTE — TOC Initial Note (Signed)
Transition of Care [facility]) - Initial/Assessment Note    Patient Details  Name: Corey West MRN: [id number] Date of Birth: 1950-11-23  Transition of Care [facility]) CM/SW Contact:    Shelbie Hutching, RN Phone Number: 04/29/2019, 1:00 PM  Clinical Narrative:                 Patient admitted with hepatic encephalopathy.  Patient is doing well today and ready for discharge.  Patient is open with [place] for RN.  PT, OT and aide added to home health order.  Patient's nephew will come and pick up at 2pm.    Expected Discharge Plan: [place] Barriers to Discharge: Barriers Resolved   Patient Goals and CMS Choice   CMS Medicare.gov Compare Post Acute Care list provided to:: Patient Choice offered to / list presented to : Patient  Expected Discharge Plan and Services Expected Discharge Plan: [place]   Discharge Planning Services: CM Consult Post Acute Care Choice: Resumption of Svcs/PTA Provider Living arrangements for the past 2 months: Single Family Home Expected Discharge Date: 04/29/19                         HH Arranged: RN, PT, OT, Nurse's Aide [place] Agency: [place] ([place]) Date HH Agency Contacted: 04/29/19 Time [place]: [id number] Representative spoke with at [place]: Floydene Flock  Prior Living Arrangements/Services Living arrangements for the past 2 months: [place] with:: Self Patient language and need for interpreter reviewed:: No Do you feel safe going back to the place where you live?: Yes      Need for Family Participation in Patient Care: Yes (Comment) Care giver support system in place?: Yes (comment)(nephew) Current home services: DME(walker) Criminal Activity/Legal Involvement Pertinent to Current Situation/Hospitalization: No - Comment as needed  Activities of Daily Living Home Assistive Devices/Equipment: Cane (specify quad or straight), Walker (specify type) ADL Screening  (condition at time of admission) Patient's cognitive ability adequate to safely complete daily activities?: Yes Is the patient deaf or have difficulty hearing?: No Does the patient have difficulty seeing, even when wearing glasses/contacts?: Yes Does the patient have difficulty concentrating, remembering, or making decisions?: Yes Patient able to express need for assistance with ADLs?: No Does the patient have difficulty dressing or bathing?: Yes Independently performs ADLs?: Yes (appropriate for developmental age) Does the patient have difficulty walking or climbing stairs?: Yes Weakness of Legs: Both Weakness of Arms/Hands: None  Permission Sought/Granted Permission sought to share information with : Case Manager, Other (comment)       Permission granted to share info w AGENCY: [facility]        Emotional Assessment Appearance:: Appears stated age Attitude/Demeanor/Rapport: Engaged Affect (typically observed): Accepting Orientation: : Oriented to Self, Oriented to Place, Oriented to  Time, Oriented to Situation Alcohol / Substance Use: Not Applicable Psych Involvement: No (comment)  Admission diagnosis:  Hepatic encephalopathy ([place]) [K72.90] Altered mental status, unspecified altered mental status type [R41.82] Patient Active Problem List   Diagnosis Date Noted  . HCAP (healthcare-associated pneumonia) 02/03/2019  . Aphasia 01/14/2019  . Acute on chronic renal failure ([place]) 01/14/2019  . Malnutrition of moderate degree 12/03/2018  . C. difficile diarrhea 12/02/2018  . Ankle fracture, left, closed, initial encounter 10/15/2018  . Ankle fracture, right, closed, initial encounter 10/15/2018  . Ankle fracture 10/15/2018  . Pressure injury of skin 07/19/2018  . UTI (urinary  tract infection) 07/18/2018  . CKD (chronic kidney disease) stage 3, GFR 30-59 ml/min (HCC) 01/02/2018  . 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 Goals of care, counseling/discussion    . Palliative care encounter   . Hepatic encephalopathy syndrome ([place]) 12/16/2017  . Hepatic encephalopathy ([place]) 10/18/2017  . Hypokalemia 10/09/2017  . Hyperglycemia 10/09/2017  . Symptomatic anemia 06/26/2017  . Sepsis ([place]) 06/12/2017  . Type 2 diabetes mellitus with diabetic nephropathy, with long-term current use of insulin ([place]) 05/29/2017  . Cellulitis in diabetic foot ([place]) 02/21/2017  . Cellulitis 02/21/2017  . GI bleed 07/20/2015  . Hematemesis 02/08/2015  . Cirrhosis ([place]) 02/08/2015  . Esophageal varices ([place]) 02/08/2015  . Gastroesophageal reflux disease 02/08/2015  . Hyperlipidemia 02/08/2015  . Chronic venous stasis dermatitis of both lower extremities 10/06/2014  . Edema 05/25/2014  . Hypertension 05/25/2014  . Obesity 05/25/2014  . Sleep apnea 05/25/2014  . Barrett's esophagus 05/25/2014  . Pancytopenia ([place]) 05/25/2014  . Iron deficiency anemia 12/04/2013  . Thrombocytopenia ([place]) 12/04/2013  . Splenomegaly 11/04/2013   PCP:  Baxter Hire, MD Pharmacy:   [place], [place] - [address] [place] [address] Phone: [phone] Fax: [phone]     Social Determinants of Health (SDOH) Interventions    Readmission Risk Interventions Readmission Risk Prevention Plan 02/03/2019 01/15/2019  Transportation Screening Complete Complete  HRI or [place] - Complete  Medication Review (RN Care Manager) Complete Complete  HRI or Home Care Consult Complete -  SW Recovery Care/Counseling Consult Not Complete -  SW Consult Not Complete Comments NA -  Palliative Care Screening Not Applicable -  A[address] and [address] Not Applicable -  Some recent data might be hidden

## 2019-04-29 NOTE — Discharge Summary (Signed)
[facility] NAME: Corey West    MR#:  [id number]  DATE OF BIRTH:  December 06, 1950  DATE OF ADMISSION:  04/28/2019 ADMITTING PHYSICIAN: Loletha Grayer, MD  DATE OF DISCHARGE: 04/29/2019  PRIMARY CARE PHYSICIAN: Baxter Hire, MD    ADMISSION DIAGNOSIS:  Hepatic encephalopathy ([place]) [K72.90] Altered mental status, unspecified altered mental status type [R41.82]  DISCHARGE DIAGNOSIS:  Active Problems:   Hepatic encephalopathy ([place])   SECONDARY DIAGNOSIS:   Past Medical History:  Diagnosis Date  . Anemia   . Anxiety    controlled;   . Arthritis   . AVM (arteriovenous malformation) of stomach, acquired with hemorrhage   . Barrett's esophagus   . Chronic kidney disease    renal infufficiency  . Cirrhosis ([place])   . Depression    controlled;   Marland Kitchen Diabetes mellitus without complication ([place])    not controlled, taking insulin but sugar continues to run high;   . Edema   . Esophageal varices ([place])   . GAVE (gastric antral vascular ectasia)   . GERD (gastroesophageal reflux disease)   . History of hiatal hernia   . Hyperlipidemia   . Hypertension    controlled well;   . Liver cirrhosis ([place])   . 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 Obesity   . Pancytopenia ([place])   . Polyp, stomach    with chronic blood loss  . Sleep apnea    does not wear a cpap, Medicare would not pay for it   . Venous stasis dermatitis of both lower extremities   . Venous stasis of both lower extremities    with cellulitis    HOSPITAL COURSE:   68 year old male with past medical history of morbid obesity, diabetes, hypertension hyperlipidemia, liver cirrhosis, previous history of hepatic encephalopathy, obstructive sleep apnea who presented to the hospital due to altered mental status.  1.  Altered mental status-this was metabolic encephalopathy secondary to the hepatic encephalopathy. -Patient's ammonia level was greater than 140.  Patient received some  Xifaxan and lactulose and ammonia level has trended down and mental status is back to baseline now.  2.  Hepatic encephalopathy-source of patient's altered mental status.  Patient's ammonia was greater than 140, patient received some lactulose and Xifaxan ammonia level is down to 70 and patient's mental status is back to baseline. - Patient will continue his lactulose to titrate to 2-3 loose stools daily and he was also discharged on some Xifaxan.  3.  Diabetes type 2 without complication- patient will continue his insulin as stated below. -Blood sugar stable while in the hospital.  4.  Hyperlipidemia-patient will continue atorvastatin.  5.  GERD-patient will continue Protonix  6. Anxiety - pt. Will cont. Ativan as needed.   DISCHARGE CONDITIONS:   Stable.   CONSULTS OBTAINED:    DRUG ALLERGIES:  No Known Allergies  DISCHARGE MEDICATIONS:   Allergies as of 04/29/2019   No Known Allergies     Medication List    STOP taking these medications   omeprazole 40 MG capsule Commonly known as: PRILOSEC     TAKE these medications   atorvastatin 40 MG tablet Commonly known as: LIPITOR Take 1 tablet (40 mg total) by mouth daily at 6 PM.   cetirizine 10 MG tablet Commonly known as: ZYRTEC Take 10 mg by mouth daily.   ferrous sulfate 325 (65 FE) MG tablet Take 325 mg by mouth 2 (two) times daily with a meal.   furosemide  40 MG tablet Commonly known as: LASIX Take 40 mg by mouth 2 (two) times a day.   insulin aspart protamine - aspart (70-30) 100 UNIT/ML FlexPen Commonly known as: NOVOLOG 70/30 MIX Inject 70-84 Units into the skin See admin instructions. Inject 84u under the skin every morning and 70u under the skin every evening   lactulose 10 GM/15ML solution Commonly known as: CHRONULAC Take 30 g by mouth 4 (four) times daily.   LORazepam 0.5 MG tablet Commonly known as: ATIVAN Take 1 tablet (0.5 mg total) by mouth every 8 (eight) hours as needed for anxiety.    metolazone 5 MG tablet Commonly known as: ZAROXOLYN Take 5 mg by mouth daily.   multivitamin tablet Take 1 tablet by mouth daily.   pantoprazole 40 MG tablet Commonly known as: Protonix Take 1 tablet (40 mg total) by mouth 2 (two) times daily.   Potassium Chloride ER 20 MEQ Tbcr Take 20 mEq by mouth 2 (two) times daily. While on lasix' What changed: additional instructions   rifaximin 550 MG Tabs tablet Commonly known as: XIFAXAN Take 1 tablet (550 mg total) by mouth 2 (two) times daily.   traMADol 50 MG tablet Commonly known as: ULTRAM Take 50 mg by mouth 3 (three) times daily.   vitamin C 250 MG tablet Commonly known as: ASCORBIC ACID Take 500 mg by mouth 2 (two) times daily.   zinc sulfate 220 (50 Zn) MG capsule Take 220 mg by mouth daily.       DISCHARGE INSTRUCTIONS:   DIET:  Cardiac diet  DISCHARGE CONDITION:  Stable  ACTIVITY:  Activity as tolerated  OXYGEN:  Home Oxygen: No.   Oxygen Delivery: room air  DISCHARGE LOCATION:  Home with Home Health PT, RN, Aide, OT   If you experience worsening of your admission symptoms, develop shortness of breath, life threatening emergency, suicidal or homicidal thoughts you must seek medical attention immediately by calling 911 or calling your MD immediately  if symptoms less severe.  You Must read complete instructions/literature along with all the possible adverse reactions/side effects for all the Medicines you take and that have been prescribed to you. Take any new Medicines after you have completely understood and accpet all the possible adverse reactions/side effects.   Please note  You were cared for by a hospitalist during your hospital stay. If you have any questions about your discharge medications or the care you received while you were in the hospital after you are discharged, you can call the unit and asked to speak with the hospitalist on call if the hospitalist that took care of you is not  available. Once you are discharged, your primary care physician will handle any further medical issues. Please note that NO REFILLS for any discharge medications will be authorized once you are discharged, as it is imperative that you return to your primary care physician (or establish a relationship with a primary care physician if you do not have one) for your aftercare needs so that they can reassess your need for medications and monitor your lab values.     Today   Mental status much improved and back to baseline.  Ammonia level has normalized.  No other acute events overnight.  VITAL SIGNS:  Blood pressure (!) 154/59, pulse 70, temperature 98 F (36.7 C), temperature source Oral, resp. rate (!) 21, height 6' (1.829 m), weight (!) 140.6 kg, SpO2 98 %.  I/O:    Intake/Output Summary (Last 24 hours) at 04/29/2019 1401 Last  data filed at 04/29/2019 1016 Gross per 24 hour  Intake 139.05 ml  Output 400 ml  Net -260.95 ml    PHYSICAL EXAMINATION:  GENERAL:  68 y.o.-year-old obese patient lying in the bed with no acute distress.  EYES: Pupils equal, round, reactive to light and accommodation. No scleral icterus. Extraocular muscles intact.  HEENT: Head atraumatic, normocephalic. Oropharynx and nasopharynx clear.  NECK:  Supple, no jugular venous distention. No thyroid enlargement, no tenderness.  LUNGS: Normal breath sounds bilaterally, no wheezing, rales,rhonchi. No use of accessory muscles of respiration.  CARDIOVASCULAR: S1, S2 normal. No murmurs, rubs, or gallops.  ABDOMEN: Soft, non-tender, non-distended. Bowel sounds present. No organomegaly or mass.  EXTREMITIES: +1-2 edema b/l, No cyanosis, or clubbing.  NEUROLOGIC: Cranial nerves II through XII are intact. No focal motor or sensory defecits b/l. Globally weak.   PSYCHIATRIC: The patient is alert and oriented x 3.  SKIN: No obvious rash, lesion, or ulcer.   DATA REVIEW:   CBC Recent Labs  Lab 04/29/19 0449  WBC 4.0  HGB  7.9*  HCT 23.8*  PLT 91*    Chemistries  Recent Labs  Lab 04/28/19 2117 04/29/19 0449  NA 140 142  K 3.7 3.9  CL 103 107  CO2 26 27  GLUCOSE 492* 362*  BUN 41* 40*  CREATININE 2.58* 2.51*  CALCIUM 8.8* 8.8*  AST 26  --   ALT 19  --   ALKPHOS 69  --   BILITOT 0.6  --     Cardiac Enzymes No results for input(s): TROPONINI in the last 168 hours.  Microbiology Results  Results for orders placed or performed during the hospital encounter of 04/28/19  SARS Coronavirus 2 (CEPHEID- Performed in [place] hospital lab), Hosp Order     Status: None   Collection Time: 04/28/19  9:34 PM   Specimen: Nasopharyngeal Swab  Result Value Ref Range Status   SARS Coronavirus 2 NEGATIVE NEGATIVE Final    Comment: (NOTE) If result is NEGATIVE SARS-CoV-2 target nucleic acids are NOT DETECTED. The SARS-CoV-2 RNA is generally detectable in upper and lower  respiratory specimens during the acute phase of infection. The lowest  concentration of SARS-CoV-2 viral copies this assay can detect is 250  copies / mL. A negative result does not preclude SARS-CoV-2 infection  and should not be used as the sole basis for treatment or other  patient management decisions.  A negative result may occur with  improper specimen collection / handling, submission of specimen other  than nasopharyngeal swab, presence of viral mutation(s) within the  areas targeted by this assay, and inadequate number of viral copies  (<250 copies / mL). A negative result must be combined with clinical  observations, patient history, and epidemiological information. If result is POSITIVE SARS-CoV-2 target nucleic acids are DETECTED. The SARS-CoV-2 RNA is generally detectable in upper and lower  respiratory specimens dur ing the acute phase of infection.  Positive  results are indicative of active infection with SARS-CoV-2.  Clinical  correlation with patient history and other diagnostic information is  necessary to  determine patient infection status.  Positive results do  not rule out bacterial infection or co-infection with other viruses. If result is PRESUMPTIVE POSTIVE SARS-CoV-2 nucleic acids MAY BE PRESENT.   A presumptive positive result was obtained on the submitted specimen  and confirmed on repeat testing.  While 2019 novel coronavirus  (SARS-CoV-2) nucleic acids may be present in the submitted sample  additional confirmatory testing may be  necessary for epidemiological  and / or clinical management purposes  to differentiate between  SARS-CoV-2 and other Sarbecovirus currently known to infect humans.  If clinically indicated additional testing with an alternate test  methodology [phone]) is advised. The SARS-CoV-2 RNA is generally  detectable in upper and lower respiratory sp ecimens during the acute  phase of infection. The expected result is Negative. Fact Sheet for Patients:  StrictlyIdeas.no Fact Sheet for Healthcare Providers: BankingDealers.co.za This test is not yet approved or cleared by the [place] FDA and has been authorized for detection and/or diagnosis of SARS-CoV-2 by FDA under an Emergency Use Authorization (EUA).  This EUA will remain in effect (meaning this test can be used) for the duration of the COVID-19 declaration under Section 564(b)(1) of the Act, 21 U.S.C. section 360bbb-3(b)(1), unless the authorization is terminated or revoked sooner. Performed at [facility], [address] [place], [place] [address]     RADIOLOGY:  Ct Head Wo Contrast  Result Date: 04/28/2019 CLINICAL DATA:  Altered mental status EXAM: CT HEAD WITHOUT CONTRAST TECHNIQUE: Contiguous axial images were obtained from the base of the skull through the vertex without intravenous contrast. COMPARISON:  04/12/2019 FINDINGS: Brain: No evidence of acute infarction, hemorrhage, hydrocephalus, extra-axial collection or mass lesion/mass  effect. Vascular: No hyperdense vessel or unexpected calcification. Skull: Normal. Negative for fracture or focal lesion. Sinuses/Orbits: The visualized paranasal sinuses are essentially clear. The mastoid air cells are unopacified. Other: None. IMPRESSION: Normal head CT. Electronically Signed   By: Julian Hy M.D.   On: 04/28/2019 22:19   US Abdomen Limited  Result Date: 04/29/2019 CLINICAL DATA:  Hepatic encephalopathy.  Paracentesis requested EXAM: LIMITED ABDOMEN ULTRASOUND FOR ASCITES TECHNIQUE: Limited ultrasound survey for ascites was performed in all four abdominal quadrants. COMPARISON:  CT 02/21/2017 FINDINGS: Survey ultrasound of the peritoneal cavity in all 4 quadrants shows no significant abdominal ascites. IMPRESSION: No ascites.  Paracentesis deferred. Electronically Signed   By: Lucrezia Europe M.D.   On: 04/29/2019 09:49   Dg Chest Portable 1 View  Result Date: 04/28/2019 CLINICAL DATA:  Altered mental status EXAM: PORTABLE CHEST 1 VIEW COMPARISON:  04/17/2019 FINDINGS: Cardiomegaly with mild central congestion. No focal airspace disease or pleural effusion. No pneumothorax. IMPRESSION: Low lung volumes with cardiomegaly and mild central congestion. No focal airspace disease. Electronically Signed   By: Donavan Foil M.D.   On: 04/28/2019 21:48      Management plans discussed with the patient, family and they are in agreement.  CODE STATUS:     Code Status Orders  (From admission, onward)         Start     Ordered   04/28/19 2254  Full code  Continuous     04/28/19 2254         TOTAL TIME TAKING CARE OF THIS PATIENT: 40 minutes.    Henreitta Leber M.D on 04/29/2019 at 2:01 PM  Between 7am to 6pm - Pager - [phone]  After 6pm go to [email or web address] - Proofreader  [facility]  Office  [phone]  CC: Primary care physician; Baxter Hire, MD

## 2019-04-29 NOTE — Care Management Obs Status (Signed)
[place] NOTIFICATION   Patient Details  Name: MARTYN TIMME MRN: [id number] Date of Birth: 1951-06-07   Medicare Observation Status Notification Given:  Yes    Shelbie Hutching, RN 04/29/2019, 12:49 PM

## 2019-04-29 NOTE — Care Management CC44 (Signed)
Condition Code 44 Documentation Completed  Patient Details  Name: Corey West MRN: [id number] Date of Birth: 04/01/1951   Condition Code 44 given:  Yes Patient signature on Condition Code 44 notice:  Yes Documentation of 2 MD's agreement:  Yes Code 44 added to claim:  Yes    Shelbie Hutching, RN 04/29/2019, 12:49 PM

## 2019-05-01 ENCOUNTER — Other Ambulatory Visit
Admission: RE | Admit: 2019-05-01 | Discharge: 2019-05-01 | Disposition: A | Discharge status: Home / Self Care | Payer: Medicare Other | Source: Ambulatory Visit | Attending: Student | Admitting: Student

## 2019-05-01 ENCOUNTER — Other Ambulatory Visit: Payer: Self-pay | Admitting: Student

## 2019-05-01 DIAGNOSIS — K7469 Other cirrhosis of liver: Secondary | ICD-10-CM | POA: Insufficient documentation

## 2019-05-01 DIAGNOSIS — K7290 Hepatic failure, unspecified without coma: Secondary | ICD-10-CM | POA: Insufficient documentation

## 2019-05-01 LAB — AMMONIA: Ammonia: 77 umol/L — ABNORMAL HIGH (ref 9–35)

## 2019-05-05 ENCOUNTER — Other Ambulatory Visit: Payer: Self-pay

## 2019-05-05 ENCOUNTER — Ambulatory Visit
Admission: RE | Admit: 2019-05-05 | Discharge: 2019-05-05 | Disposition: A | Discharge status: Home / Self Care | Payer: Medicare Other | Source: Ambulatory Visit | Attending: Student | Admitting: Student

## 2019-05-05 DIAGNOSIS — K7469 Other cirrhosis of liver: Secondary | ICD-10-CM | POA: Insufficient documentation

## 2019-05-06 ENCOUNTER — Other Ambulatory Visit (HOSPITAL_COMMUNITY): Payer: Self-pay | Admitting: Student

## 2019-05-06 ENCOUNTER — Other Ambulatory Visit: Payer: Self-pay | Admitting: Student

## 2019-05-06 DIAGNOSIS — K7469 Other cirrhosis of liver: Secondary | ICD-10-CM

## 2019-05-06 DIAGNOSIS — K862 Cyst of pancreas: Secondary | ICD-10-CM

## 2019-05-06 DIAGNOSIS — K31819 Angiodysplasia of stomach and duodenum without bleeding: Secondary | ICD-10-CM

## 2019-05-15 ENCOUNTER — Inpatient Hospital Stay: Payer: Medicare Other

## 2019-05-17 ENCOUNTER — Other Ambulatory Visit: Payer: Self-pay | Admitting: Student

## 2019-05-17 ENCOUNTER — Ambulatory Visit
Admission: RE | Admit: 2019-05-17 | Discharge: 2019-05-17 | Disposition: A | Discharge status: Home / Self Care | Payer: Medicare Other | Source: Ambulatory Visit | Attending: Student | Admitting: Student

## 2019-05-17 DIAGNOSIS — K31819 Angiodysplasia of stomach and duodenum without bleeding: Secondary | ICD-10-CM | POA: Insufficient documentation

## 2019-05-17 DIAGNOSIS — K862 Cyst of pancreas: Secondary | ICD-10-CM | POA: Diagnosis present

## 2019-05-17 DIAGNOSIS — K7469 Other cirrhosis of liver: Secondary | ICD-10-CM | POA: Diagnosis present

## 2019-05-17 MED ORDER — GADOBUTROL 1 MMOL/ML IV SOLN
10.0000 mL | Freq: Once | INTRAVENOUS | Status: AC | PRN
  Administered 2019-05-17: 10 mL via INTRAVENOUS

## 2019-05-18 DIAGNOSIS — K862 Cyst of pancreas: Secondary | ICD-10-CM | POA: Insufficient documentation

## 2019-05-27 ENCOUNTER — Other Ambulatory Visit
Admission: RE | Admit: 2019-05-27 | Discharge: 2019-05-27 | Disposition: A | Discharge status: Home / Self Care | Payer: Medicare Other | Source: Ambulatory Visit | Attending: Internal Medicine | Admitting: Internal Medicine

## 2019-05-27 DIAGNOSIS — K7469 Other cirrhosis of liver: Secondary | ICD-10-CM | POA: Diagnosis present

## 2019-05-27 LAB — AMMONIA: Ammonia: 20 umol/L (ref 9–35)

## 2019-06-11 ENCOUNTER — Other Ambulatory Visit: Payer: Self-pay

## 2019-06-12 ENCOUNTER — Inpatient Hospital Stay: Payer: Medicare Other

## 2019-06-12 ENCOUNTER — Other Ambulatory Visit: Payer: Self-pay

## 2019-06-12 ENCOUNTER — Inpatient Hospital Stay: Payer: Medicare Other | Attending: Oncology

## 2019-06-12 DIAGNOSIS — D696 Thrombocytopenia, unspecified: Secondary | ICD-10-CM

## 2019-06-12 DIAGNOSIS — D631 Anemia in chronic kidney disease: Secondary | ICD-10-CM | POA: Insufficient documentation

## 2019-06-12 DIAGNOSIS — N189 Chronic kidney disease, unspecified: Secondary | ICD-10-CM | POA: Diagnosis present

## 2019-06-12 LAB — CBC WITH DIFFERENTIAL/PLATELET
Abs Immature Granulocytes: 0.02 10*3/uL (ref 0.00–0.07)
Basophils Absolute: 0 10*3/uL (ref 0.0–0.1)
Basophils Relative: 1 %
Eosinophils Absolute: 0 10*3/uL (ref 0.0–0.5)
Eosinophils Relative: 0 %
HCT: 30 % — ABNORMAL LOW (ref 39.0–52.0)
Hemoglobin: 10.4 g/dL — ABNORMAL LOW (ref 13.0–17.0)
Immature Granulocytes: 0 %
Lymphocytes Relative: 14 %
Lymphs Abs: 0.9 10*3/uL (ref 0.7–4.0)
MCH: 30.9 pg (ref 26.0–34.0)
MCHC: 34.7 g/dL (ref 30.0–36.0)
MCV: 89 fL (ref 80.0–100.0)
Monocytes Absolute: 0.4 10*3/uL (ref 0.1–1.0)
Monocytes Relative: 7 %
Neutro Abs: 4.8 10*3/uL (ref 1.7–7.7)
Neutrophils Relative %: 78 %
Platelets: 98 10*3/uL — ABNORMAL LOW (ref 150–400)
RBC: 3.37 MIL/uL — ABNORMAL LOW (ref 4.22–5.81)
RDW: 13.7 % (ref 11.5–15.5)
WBC: 6.2 10*3/uL (ref 4.0–10.5)
nRBC: 0 % (ref 0.0–0.2)

## 2019-06-12 LAB — IRON AND TIBC
Iron: 93 ug/dL (ref 45–182)
Saturation Ratios: 27 % (ref 17.9–39.5)
TIBC: 347 ug/dL (ref 250–450)
UIBC: 254 ug/dL

## 2019-06-12 LAB — FERRITIN: Ferritin: 53 ng/mL (ref 24–336)

## 2019-06-15 ENCOUNTER — Other Ambulatory Visit
Admission: RE | Admit: 2019-06-15 | Discharge: 2019-06-15 | Disposition: A | Discharge status: Home / Self Care | Payer: Medicare Other | Source: Ambulatory Visit | Attending: Student | Admitting: Student

## 2019-06-15 DIAGNOSIS — K7290 Hepatic failure, unspecified without coma: Secondary | ICD-10-CM | POA: Insufficient documentation

## 2019-06-15 DIAGNOSIS — K7469 Other cirrhosis of liver: Secondary | ICD-10-CM | POA: Insufficient documentation

## 2019-06-15 LAB — AMMONIA: Ammonia: 55 umol/L — ABNORMAL HIGH (ref 9–35)

## 2019-06-30 ENCOUNTER — Other Ambulatory Visit
Admission: RE | Admit: 2019-06-30 | Discharge: 2019-06-30 | Disposition: A | Discharge status: Home / Self Care | Payer: Medicare Other | Source: Ambulatory Visit | Attending: Internal Medicine | Admitting: Internal Medicine

## 2019-06-30 DIAGNOSIS — K7469 Other cirrhosis of liver: Secondary | ICD-10-CM | POA: Insufficient documentation

## 2019-06-30 LAB — AMMONIA: Ammonia: 72 umol/L — ABNORMAL HIGH (ref 9–35)

## 2019-07-09 ENCOUNTER — Other Ambulatory Visit
Admission: RE | Admit: 2019-07-09 | Discharge: 2019-07-09 | Disposition: A | Discharge status: Home / Self Care | Payer: Medicare Other | Source: Ambulatory Visit | Attending: Internal Medicine | Admitting: Internal Medicine

## 2019-07-09 DIAGNOSIS — K7469 Other cirrhosis of liver: Secondary | ICD-10-CM | POA: Diagnosis present

## 2019-07-09 LAB — AMMONIA: Ammonia: 82 umol/L — ABNORMAL HIGH (ref 9–35)

## 2019-07-10 ENCOUNTER — Inpatient Hospital Stay: Payer: Medicare Other | Attending: Oncology

## 2019-07-10 ENCOUNTER — Inpatient Hospital Stay: Payer: Medicare Other

## 2019-07-15 ENCOUNTER — Other Ambulatory Visit
Admission: RE | Admit: 2019-07-15 | Discharge: 2019-07-15 | Disposition: A | Discharge status: Home / Self Care | Payer: Medicare Other | Source: Ambulatory Visit | Attending: Internal Medicine | Admitting: Internal Medicine

## 2019-07-15 DIAGNOSIS — K7469 Other cirrhosis of liver: Secondary | ICD-10-CM | POA: Diagnosis present

## 2019-07-15 LAB — AMMONIA: Ammonia: 56 umol/L — ABNORMAL HIGH (ref 9–35)

## 2019-07-28 ENCOUNTER — Other Ambulatory Visit
Admission: RE | Admit: 2019-07-28 | Discharge: 2019-07-28 | Disposition: A | Discharge status: Home / Self Care | Payer: Medicare Other | Source: Ambulatory Visit | Attending: Internal Medicine | Admitting: Internal Medicine

## 2019-07-28 DIAGNOSIS — K7469 Other cirrhosis of liver: Secondary | ICD-10-CM | POA: Diagnosis present

## 2019-07-28 LAB — AMMONIA: Ammonia: 38 umol/L — ABNORMAL HIGH (ref 9–35)

## 2019-08-10 ENCOUNTER — Other Ambulatory Visit
Admission: RE | Admit: 2019-08-10 | Discharge: 2019-08-10 | Disposition: A | Discharge status: Home / Self Care | Payer: Medicare Other | Source: Ambulatory Visit | Attending: Internal Medicine | Admitting: Internal Medicine

## 2019-08-10 DIAGNOSIS — K7469 Other cirrhosis of liver: Secondary | ICD-10-CM | POA: Insufficient documentation

## 2019-08-10 LAB — AMMONIA: Ammonia: 83 umol/L — ABNORMAL HIGH (ref 9–35)

## 2019-08-21 NOTE — Progress Notes (Deleted)
Corey West  [phone] [phone]  ID: Corey West OB: 1950/10/13  MR#: [id number]  JIR#:[id number]  Patient Care Team: Baxter Hire, MD as PCP - General (Internal Medicine) Baxter Hire, MD (Internal Medicine)  CHIEF COMPLAINT: Anemia, thrombocytopenia  INTERVAL HISTORY: Patient returns to clinic today for repeat laboratory work, further evaluation, and continuation of Procrit.  He continues to have weakness and fatigue, but otherwise feels well.  He has no neurologic complaints.  He denies any recent fevers.  He has a fair appetite, but denies weight loss.  He denies any chest pain, shortness of breath, cough, or hemoptysis. He denies any nausea, vomiting, constipation, or diarrhea.  He has no melena or hematochezia. He has no urinary complaints.  Patient offers no further specific complaints today.  REVIEW OF SYSTEMS:   Review of Systems  Constitutional: Positive for malaise/fatigue. Negative for fever and weight loss.  Respiratory: Negative.  Negative for cough, hemoptysis and shortness of breath.   Cardiovascular: Negative.  Negative for chest pain and leg swelling.  Gastrointestinal: Negative.  Negative for abdominal pain, blood in stool and melena.  Genitourinary: Negative.  Negative for hematuria.  Musculoskeletal: Negative.  Negative for back pain.  Skin: Negative.  Negative for rash.  Neurological: Positive for weakness. Negative for sensory change, focal weakness and headaches.  Psychiatric/Behavioral: Negative.  The patient is not nervous/anxious.     As per HPI. Otherwise, a complete review of systems is negative.  PAST MEDICAL HISTORY: Past Medical History:  Diagnosis Date   Anemia    Anxiety    controlled;    Arthritis    AVM (arteriovenous malformation) of stomach, acquired with hemorrhage    Barrett's esophagus    Chronic kidney disease    renal infufficiency   Cirrhosis (HCC)    Depression    controlled;    Diabetes mellitus without complication (HCC)    not controlled, taking insulin but sugar continues to run high;    Edema    Esophageal varices (HCC)    GAVE (gastric antral vascular ectasia)    GERD (gastroesophageal reflux disease)    History of hiatal hernia    Hyperlipidemia    Hypertension    controlled well;    Liver cirrhosis (HCC)    Nephropathy, 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[place])    Obesity    Pancytopenia (HCC)    Polyp, stomach    with chronic blood loss   Sleep apnea    does not wear a cpap, Medicare would not pay for it    Venous stasis dermatitis of both lower extremities    Venous stasis of both lower extremities    with cellulitis    PAST SURGICAL HISTORY: Past Surgical History:  Procedure Laterality Date   ESOPHAGOGASTRODUODENOSCOPY N/A 02/09/2015   Procedure: ESOPHAGOGASTRODUODENOSCOPY (EGD);  Surgeon: Manya Silvas, MD;  Location: [facility] ENDOSCOPY;  Service: Endoscopy;  Laterality: N/A;   ESOPHAGOGASTRODUODENOSCOPY N/A 07/22/2015   Procedure: ESOPHAGOGASTRODUODENOSCOPY (EGD);  Surgeon: Manya Silvas, MD;  Location: [facility] ENDOSCOPY;  Service: Endoscopy;  Laterality: N/A;   ESOPHAGOGASTRODUODENOSCOPY N/A 06/26/2017   Procedure: ESOPHAGOGASTRODUODENOSCOPY (EGD);  Surgeon: Manya Silvas, MD;  Location: [facility] ENDOSCOPY;  Service: Endoscopy;  Laterality: N/A;   ESOPHAGOGASTRODUODENOSCOPY N/A 06/27/2017   Procedure: ESOPHAGOGASTRODUODENOSCOPY (EGD);  Surgeon: Manya Silvas, MD;  Location: [facility] ENDOSCOPY;  Service: Endoscopy;  Laterality: N/A;   ESOPHAGOGASTRODUODENOSCOPY N/A 06/28/2017   Procedure: ESOPHAGOGASTRODUODENOSCOPY (EGD);  Surgeon: Manya Silvas, MD;  Location: [facility] ENDOSCOPY;  Service: Endoscopy;  Laterality: N/A;   ESOPHAGOGASTRODUODENOSCOPY Bilateral 10/11/2017   Procedure: ESOPHAGOGASTRODUODENOSCOPY (EGD);  Surgeon: Manya Silvas, MD;  Location: [facility] ENDOSCOPY;  Service: Endoscopy;  Laterality: Bilateral;    ESOPHAGOGASTRODUODENOSCOPY N/A 12/04/2017   Procedure: ESOPHAGOGASTRODUODENOSCOPY (EGD);  Surgeon: Manya Silvas, MD;  Location: [facility] ENDOSCOPY;  Service: Endoscopy;  Laterality: N/A;   ESOPHAGOGASTRODUODENOSCOPY N/A 03/06/2019   Procedure: ESOPHAGOGASTRODUODENOSCOPY (EGD);  Surgeon: Toledo, Benay Pike, MD;  Location: [facility] ENDOSCOPY;  Service: Gastroenterology;  Laterality: N/A;   ESOPHAGOGASTRODUODENOSCOPY (EGD) WITH PROPOFOL N/A 07/20/2015   Procedure: ESOPHAGOGASTRODUODENOSCOPY (EGD) WITH PROPOFOL;  Surgeon: Manya Silvas, MD;  Location: [facility] ENDOSCOPY;  Service: Endoscopy;  Laterality: N/A;   ESOPHAGOGASTRODUODENOSCOPY (EGD) WITH PROPOFOL N/A 09/16/2015   Procedure: ESOPHAGOGASTRODUODENOSCOPY (EGD) WITH PROPOFOL;  Surgeon: Manya Silvas, MD;  Location: [facility] ENDOSCOPY;  Service: Endoscopy;  Laterality: N/A;   ESOPHAGOGASTRODUODENOSCOPY (EGD) WITH PROPOFOL N/A 03/16/2016   Procedure: ESOPHAGOGASTRODUODENOSCOPY (EGD) WITH PROPOFOL;  Surgeon: Manya Silvas, MD;  Location: [facility] ENDOSCOPY;  Service: Endoscopy;  Laterality: N/A;   ESOPHAGOGASTRODUODENOSCOPY (EGD) WITH PROPOFOL N/A 09/14/2016   Procedure: ESOPHAGOGASTRODUODENOSCOPY (EGD) WITH PROPOFOL;  Surgeon: Manya Silvas, MD;  Location: [facility] ENDOSCOPY;  Service: Endoscopy;  Laterality: N/A;   ESOPHAGOGASTRODUODENOSCOPY (EGD) WITH PROPOFOL N/A 11/05/2016   Procedure: ESOPHAGOGASTRODUODENOSCOPY (EGD) WITH PROPOFOL;  Surgeon: Manya Silvas, MD;  Location: [facility] ENDOSCOPY;  Service: Endoscopy;  Laterality: N/A;   ESOPHAGOGASTRODUODENOSCOPY (EGD) WITH PROPOFOL N/A 02/06/2017   Procedure: ESOPHAGOGASTRODUODENOSCOPY (EGD) WITH PROPOFOL;  Surgeon: Manya Silvas, MD;  Location: [facility] ENDOSCOPY;  Service: Endoscopy;  Laterality: N/A;   ESOPHAGOGASTRODUODENOSCOPY (EGD) WITH PROPOFOL N/A 04/24/2017   Procedure: ESOPHAGOGASTRODUODENOSCOPY (EGD) WITH PROPOFOL;  Surgeon: Manya Silvas, MD;  Location: [facility] ENDOSCOPY;  Service: Endoscopy;   Laterality: N/A;   ESOPHAGOGASTRODUODENOSCOPY (EGD) WITH PROPOFOL N/A 07/31/2017   Procedure: ESOPHAGOGASTRODUODENOSCOPY (EGD) WITH PROPOFOL;  Surgeon: Manya Silvas, MD;  Location: [facility] ENDOSCOPY;  Service: Endoscopy;  Laterality: N/A;   ESOPHAGOGASTRODUODENOSCOPY (EGD) WITH PROPOFOL N/A 08/08/2018   Procedure: ESOPHAGOGASTRODUODENOSCOPY (EGD) WITH PROPOFOL;  Surgeon: Manya Silvas, MD;  Location: [facility] ENDOSCOPY;  Service: Endoscopy;  Laterality: N/A;   GIVENS CAPSULE STUDY N/A 12/05/2017   Procedure: GIVENS CAPSULE STUDY;  Surgeon: Manya Silvas, MD;  Location: [facility] ENDOSCOPY;  Service: Endoscopy;  Laterality: N/A;   TONSILLECTOMY     TONSILLECTOMY AND ADENOIDECTOMY     ULNAR NERVE TRANSPOSITION     UVULOPALATOPHARYNGOPLASTY      FAMILY HISTORY: Family History  Problem Relation Age of Onset   Diabetes Other    Transient ischemic attack Father    CAD Father     ADVANCED DIRECTIVES (Y/N):  N  HEALTH MAINTENANCE: Social History   Tobacco Use   Smoking status: Former Smoker    Years: 20.00    Types: Cigars, Cigarettes   Smokeless tobacco: Never Used  Substance Use Topics   Alcohol use: No    Comment: stopped 15 years ago   Drug use: No     Colonoscopy:  PAP:  Bone density:  Lipid panel:  No Known Allergies  Current Outpatient Medications  Medication Sig Dispense Refill   atorvastatin (LIPITOR) 40 MG tablet Take 1 tablet (40 mg total) by mouth daily at 6 PM. 30 tablet 0   cetirizine (ZYRTEC) 10 MG tablet Take 10 mg by mouth daily.     ferrous sulfate 325 (65 FE) MG tablet Take 325 mg by mouth 2 (two) times daily with a meal.      furosemide (LASIX) 40 MG tablet  Take 40 mg by mouth 2 (two) times a day.      insulin aspart protamine - aspart (NOVOLOG 70/30 MIX) (70-30) 100 UNIT/ML FlexPen Inject 70-84 Units into the skin See admin instructions. Inject 84u under the skin every morning and 70u under the skin every evening     lactulose  (CHRONULAC) 10 GM/15ML solution Take 30 g by mouth 4 (four) times daily.      LORazepam (ATIVAN) 0.5 MG tablet Take 1 tablet (0.5 mg total) by mouth every 8 (eight) hours as needed for anxiety. 20 tablet 0   metolazone (ZAROXOLYN) 5 MG tablet Take 5 mg by mouth daily.     Multiple Vitamin (MULTIVITAMIN) tablet Take 1 tablet by mouth daily.     pantoprazole (PROTONIX) 40 MG tablet Take 1 tablet (40 mg total) by mouth 2 (two) times daily. 60 tablet 11   Potassium Chloride ER 20 MEQ TBCR Take 20 mEq by mouth 2 (two) times daily. While on lasix' (Patient taking differently: Take 20 mEq by mouth 2 (two) times daily. ) 30 tablet 1   rifaximin (XIFAXAN) 550 MG TABS tablet Take 1 tablet (550 mg total) by mouth 2 (two) times daily. 60 tablet 0   traMADol (ULTRAM) 50 MG tablet Take 50 mg by mouth 3 (three) times daily.      vitamin C (ASCORBIC ACID) 250 MG tablet Take 500 mg by mouth 2 (two) times daily.     zinc sulfate 220 (50 Zn) MG capsule Take 220 mg by mouth daily.     No current facility-administered medications for this visit.     OBJECTIVE: There were no vitals filed for this visit.   There is no height or weight on file to calculate BMI.    ECOG FS:0 - Asymptomatic  General: Well-developed, well-nourished, no acute distress. Eyes: Pink conjunctiva, anicteric sclera. HEENT: Normocephalic, moist mucous membranes. Lungs: Clear to auscultation bilaterally. Heart: Regular rate and rhythm. No rubs, murmurs, or gallops. Abdomen: Soft, nontender, nondistended. No organomegaly noted, normoactive bowel sounds. Musculoskeletal: No edema, cyanosis, or clubbing. Neuro: Alert, answering all questions appropriately. Cranial nerves grossly intact. Skin: No rashes or petechiae noted. Psych: Normal affect.  LAB RESULTS:  Lab Results  Component Value Date   NA 142 04/29/2019   K 3.9 04/29/2019   CL 107 04/29/2019   CO2 27 04/29/2019   GLUCOSE 362 (H) 04/29/2019   BUN 40 (H) 04/29/2019    CREATININE 2.51 (H) 04/29/2019   CALCIUM 8.8 (L) 04/29/2019   PROT 6.7 04/28/2019   ALBUMIN 2.9 (L) 04/28/2019   AST 26 04/28/2019   ALT 19 04/28/2019   ALKPHOS 69 04/28/2019   BILITOT 0.6 04/28/2019   GFRNONAA 25 (L) 04/29/2019   GFRAA 30 (L) 04/29/2019    Lab Results  Component Value Date   WBC 6.2 06/12/2019   NEUTROABS 4.8 06/12/2019   HGB 10.4 (L) 06/12/2019   HCT 30.0 (L) 06/12/2019   MCV 89.0 06/12/2019   PLT 98 (L) 06/12/2019   Lab Results  Component Value Date   IRON 93 06/12/2019   TIBC 347 06/12/2019   IRONPCTSAT 27 06/12/2019   Lab Results  Component Value Date   FERRITIN 53 06/12/2019     STUDIES: No results found.  ASSESSMENT: Anemia, thrombocytopenia  PLAN:    1.  Anemia: Likely secondary to chronic renal insufficiency.  Patient also has known AV malformations in his stomach that may be contributing.  Patient's most recent EGD was on August 08, 2018.  His hemoglobin is significantly decreased and he will benefit from 40,000 units Procrit today.  Iron stores continue to be within normal limits.  Return to clinic every 4 weeks for laboratory work and Procrit if his hemoglobin remains below 10.0.  Patient will then return to clinic in 4 months for further evaluation and continuation of treatment. 2.  Thrombocytopenia: Chronic and unchanged.  Possibly secondary to underlying cirrhosis patient platelet count is 97 today. 3.  Chronic renal insufficiency: Continue monitoring and treatment per nephrology.   Patient expressed understanding and was in agreement with this plan. He also understands that He can call clinic at any time with any questions, concerns, or complaints.    Lloyd Huger, MD   08/21/2019 1:34 PM

## 2019-08-24 ENCOUNTER — Other Ambulatory Visit
Admission: RE | Admit: 2019-08-24 | Discharge: 2019-08-24 | Disposition: A | Discharge status: Home / Self Care | Payer: Medicare Other | Source: Ambulatory Visit | Attending: Internal Medicine | Admitting: Internal Medicine

## 2019-08-24 DIAGNOSIS — K7469 Other cirrhosis of liver: Secondary | ICD-10-CM | POA: Insufficient documentation

## 2019-08-24 LAB — AMMONIA: Ammonia: 33 umol/L (ref 9–35)

## 2019-08-25 ENCOUNTER — Inpatient Hospital Stay: Payer: Medicare Other

## 2019-08-25 ENCOUNTER — Inpatient Hospital Stay: Payer: Medicare Other | Admitting: Oncology

## 2019-09-28 ENCOUNTER — Other Ambulatory Visit
Admission: RE | Admit: 2019-09-28 | Discharge: 2019-09-28 | Disposition: A | Discharge status: Home / Self Care | Payer: Medicare Other | Source: Ambulatory Visit | Attending: Internal Medicine | Admitting: Internal Medicine

## 2019-09-28 DIAGNOSIS — K7469 Other cirrhosis of liver: Secondary | ICD-10-CM | POA: Insufficient documentation

## 2019-09-28 LAB — AMMONIA: Ammonia: 38 umol/L — ABNORMAL HIGH (ref 9–35)

## 2019-10-23 ENCOUNTER — Other Ambulatory Visit: Payer: Self-pay | Admitting: Student

## 2019-10-23 DIAGNOSIS — K7469 Other cirrhosis of liver: Secondary | ICD-10-CM

## 2019-10-23 DIAGNOSIS — K862 Cyst of pancreas: Secondary | ICD-10-CM

## 2019-11-04 ENCOUNTER — Ambulatory Visit: Payer: Medicare Other

## 2019-11-05 ENCOUNTER — Other Ambulatory Visit
Admission: RE | Admit: 2019-11-05 | Discharge: 2019-11-05 | Disposition: A | Discharge status: Home / Self Care | Payer: Medicare Other | Source: Ambulatory Visit | Attending: Internal Medicine | Admitting: Internal Medicine

## 2019-11-05 DIAGNOSIS — K7469 Other cirrhosis of liver: Secondary | ICD-10-CM | POA: Insufficient documentation

## 2019-11-05 LAB — AMMONIA: Ammonia: 43 umol/L — ABNORMAL HIGH (ref 9–35)

## 2019-11-11 ENCOUNTER — Other Ambulatory Visit: Payer: Self-pay

## 2019-11-11 ENCOUNTER — Ambulatory Visit
Admission: RE | Admit: 2019-11-11 | Discharge: 2019-11-11 | Disposition: A | Discharge status: Home / Self Care | Payer: Medicare Other | Source: Ambulatory Visit | Attending: Student | Admitting: Student

## 2019-11-11 DIAGNOSIS — K7469 Other cirrhosis of liver: Secondary | ICD-10-CM | POA: Insufficient documentation

## 2019-11-11 DIAGNOSIS — K862 Cyst of pancreas: Secondary | ICD-10-CM | POA: Diagnosis present

## 2019-11-19 ENCOUNTER — Ambulatory Visit: Payer: Medicare Other | Admitting: Physician Assistant

## 2019-11-23 ENCOUNTER — Other Ambulatory Visit: Payer: Self-pay

## 2019-11-23 ENCOUNTER — Encounter: Payer: Medicare Other | Attending: Physician Assistant | Admitting: Physician Assistant

## 2019-11-23 DIAGNOSIS — L97822 Non-pressure chronic ulcer of other part of left lower leg with fat layer exposed: Secondary | ICD-10-CM | POA: Diagnosis not present

## 2019-11-23 DIAGNOSIS — L97812 Non-pressure chronic ulcer of other part of right lower leg with fat layer exposed: Secondary | ICD-10-CM | POA: Diagnosis not present

## 2019-11-23 DIAGNOSIS — Z6841 Body Mass Index (BMI) 40.0 and over, adult: Secondary | ICD-10-CM | POA: Diagnosis not present

## 2019-11-23 DIAGNOSIS — E11622 Type 2 diabetes mellitus with other skin ulcer: Secondary | ICD-10-CM | POA: Insufficient documentation

## 2019-11-23 DIAGNOSIS — K7460 Unspecified cirrhosis of liver: Secondary | ICD-10-CM | POA: Insufficient documentation

## 2019-11-23 DIAGNOSIS — G4730 Sleep apnea, unspecified: Secondary | ICD-10-CM | POA: Insufficient documentation

## 2019-11-23 DIAGNOSIS — I129 Hypertensive chronic kidney disease with stage 1 through stage 4 chronic kidney disease, or unspecified chronic kidney disease: Secondary | ICD-10-CM | POA: Insufficient documentation

## 2019-11-23 DIAGNOSIS — N189 Chronic kidney disease, unspecified: Secondary | ICD-10-CM | POA: Insufficient documentation

## 2019-11-23 DIAGNOSIS — Z794 Long term (current) use of insulin: Secondary | ICD-10-CM | POA: Insufficient documentation

## 2019-11-23 DIAGNOSIS — I890 Lymphedema, not elsewhere classified: Secondary | ICD-10-CM | POA: Diagnosis not present

## 2019-11-23 DIAGNOSIS — L84 Corns and callosities: Secondary | ICD-10-CM | POA: Insufficient documentation

## 2019-11-23 DIAGNOSIS — F17290 Nicotine dependence, other tobacco product, uncomplicated: Secondary | ICD-10-CM | POA: Diagnosis not present

## 2019-11-23 NOTE — Progress Notes (Signed)
[name], [name] ([id number]) Visit Report for 11/23/2019 Abuse/Suicide Risk Screen Details Patient Name: Corey West, Corey West. Date of Service: 11/23/2019 1:15 PM Medical Record Number: [id number] Patient Account Number: [id number] Date of Birth/Sex: 1951-08-10 (69 y.o. M) Treating RN: Montey Hora Primary Care Kristion Holifield: [name] Other Clinician: Referring Jacinta Penalver: Referral, Self Treating Mikalyn Hermida/Extender: Melburn Hake, HOYT Weeks in Treatment: 0 Abuse/Suicide Risk Screen Items Answer ABUSE RISK SCREEN: Has anyone close to you tried to hurt or harm you recentlyo No Do you feel uncomfortable with anyone in your familyo No Has anyone forced you do things that you didnot want to doo No Electronic Signature(s) Signed: 11/23/2019 4:53:18 PM By: Montey Hora Entered By: Montey Hora on 11/23/2019 13:20:25 Corey West ([id number]) -------------------------------------------------------------------------------- Activities of Daily Living Details Patient Name: [name], [name] Date of Service: 11/23/2019 1:15 PM Medical Record Number: [id number] Patient Account Number: [id number] Date of Birth/Sex: September 23, 1951 (69 y.o. M) Treating RN: Montey Hora Primary Care Surya Folden: [name] Other Clinician: Referring Corey West: Referral, Self Treating Corey West/Extender: Melburn Hake, HOYT Weeks in Treatment: 0 Activities of Daily Living Items Answer Activities of Daily Living (Please select one for each item) Drive Automobile Completely Able Take Medications Completely Able Use Telephone Completely Able Care for Appearance Completely Able Use Toilet Completely Able Bath / Shower Completely Able Dress Self Completely Able Feed Self Completely Able Walk Completely Able Get In / Out Bed Completely Able Housework Completely Able Prepare Meals Completely [place] for Self Completely Able Electronic Signature(s) Signed: 11/23/2019 4:53:18 PM By: Montey Hora Entered  By: Montey Hora on 11/23/2019 13:22:30 Corey West ([id number]) -------------------------------------------------------------------------------- Education Screening Details Patient Name: Corey Cower A. Date of Service: 11/23/2019 1:15 PM Medical Record Number: [id number] Patient Account Number: [id number] Date of Birth/Sex: [birth year] (69 y.o. M) Treating RN: Montey Hora Primary Care Salar Molden: [name] Other Clinician: Referring Corey West: Referral, Self Treating Corey West/Extender: Melburn Hake, HOYT Weeks in Treatment: 0 Primary Learner Assessed: Patient Learning Preferences/Education Level/Primary Language Learning Preference: Explanation, Demonstration Highest Education Level: College or Above Preferred Language: English Cognitive Barrier Language Barrier: No Translator Needed: No Memory Deficit: No Emotional Barrier: No Cultural/Religious Beliefs Affecting Medical Care: No Physical Barrier Impaired Vision: No Impaired Hearing: No Decreased Hand dexterity: No Knowledge/Comprehension Knowledge Level: Medium Comprehension Level: Medium Ability to understand written instructions: Medium Ability to understand verbal instructions: Medium Motivation Anxiety Level: Calm Cooperation: Cooperative Education Importance: Acknowledges Need Interest in Health Problems: Asks Questions Perception: Coherent Willingness to Engage in Self-Management Medium Activities: Readiness to Engage in Self-Management Medium Activities: Electronic Signature(s) Signed: 11/23/2019 4:53:18 PM By: Montey Hora Entered By: Montey Hora on 11/23/2019 13:22:55 Corey West ([id number]) -------------------------------------------------------------------------------- Fall Risk Assessment Details Patient Name: Corey West Date of Service: 11/23/2019 1:15 PM Medical Record Number: [id number] Patient Account Number: [id number] Date of Birth/Sex: Jul 15, 1951 (69 y.o. M) Treating RN: Montey Hora Primary Care Kennady Zimmerle: [name] Other Clinician: Referring Delmas Faucett: Referral, Self Treating Corey West/Extender: Melburn Hake, HOYT Weeks in Treatment: 0 Fall Risk Assessment Items Have you had 2 or more falls in the last 12 monthso 0 No Have you had any fall that resulted in injury in the last 12 monthso 0 No FALLS RISK SCREEN History of falling - immediate or within 3 months 0 No Secondary diagnosis (Do you have 2 or more medical diagnoseso) 0 No Ambulatory aid None/bed rest/wheelchair/nurse 0 No Crutches/cane/walker 15 Yes Furniture 0 No Intravenous therapy Access/Saline/Heparin Lock 0 No Gait/Transferring Normal/ bed rest/ wheelchair 0 No Weak (short  steps with or without shuffle, stooped but able to lift head while walking, may seek 10 Yes support from furniture) Impaired (short steps with shuffle, may have difficulty arising from chair, head down, impaired 0 No balance) Mental Status Oriented to own ability 0 Yes Electronic Signature(s) Signed: 11/23/2019 4:53:18 PM By: Montey Hora Entered By: Montey Hora on 11/23/2019 13:23:08 Corey West ([id number]) -------------------------------------------------------------------------------- Foot Assessment Details Patient Name: Corey Cower A. Date of Service: 11/23/2019 1:15 PM Medical Record Number: [id number] Patient Account Number: [id number] Date of Birth/Sex: March 21, 1951 (69 y.o. M) Treating RN: Montey Hora Primary Care Corey West: [name] Other Clinician: Referring Johnnell Liou: Referral, Self Treating Azani Brogdon/Extender: Melburn Hake, HOYT Weeks in Treatment: 0 Foot Assessment Items Site Locations + = Sensation present, - = Sensation absent, C = Callus, U = Ulcer R = Redness, W = Warmth, M = Maceration, PU = Pre-ulcerative lesion F = Fissure, S = Swelling, D = Dryness Assessment Right: Left: Other Deformity: No No Prior Foot Ulcer: No No Prior Amputation: No No Charcot Joint: No No Ambulatory Status:  Ambulatory With Help Assistance Device: Walker Gait: Steady Electronic Signature(s) Signed: 11/23/2019 4:53:18 PM By: Montey Hora Entered By: Montey Hora on 11/23/2019 13:29:15 Corey West ([id number]) -------------------------------------------------------------------------------- Nutrition Risk Screening Details Patient Name: Corey Cower A. Date of Service: 11/23/2019 1:15 PM Medical Record Number: [id number] Patient Account Number: [id number] Date of Birth/Sex: Mar 23, 1951 (69 y.o. M) Treating RN: Montey Hora Primary Care Chazlyn Cude: [name] Other Clinician: Referring Graviel Payeur: Referral, Self Treating Avary Pitsenbarger/Extender: Melburn Hake, HOYT Weeks in Treatment: 0 Height (in): 72 Weight (lbs): 318 Body Mass Index (BMI): 43.1 Nutrition Risk Screening Items Score Screening NUTRITION RISK SCREEN: I have an illness or condition that made me change the kind and/or amount of food I eat 0 No I eat fewer than two meals per day 0 No I eat few fruits and vegetables, or milk products 0 No I have three or more drinks of beer, liquor or wine almost every day 0 No I have tooth or mouth problems that make it hard for me to eat 0 No I don't always have enough money to buy the food I need 0 No I eat alone most of the time 0 No I take three or more different prescribed or over-the-counter drugs a day 1 Yes Without wanting to, I have lost or gained 10 pounds in the last six months 0 No I am not always physically able to shop, cook and/or feed myself 0 No Nutrition Protocols Good Risk Protocol 0 No interventions needed Moderate Risk Protocol High Risk Proctocol Risk Level: Good Risk Score: 1 Electronic Signature(s) Signed: 11/23/2019 4:53:18 PM By: Montey Hora Entered By: Montey Hora on 11/23/2019 13:23:18

## 2019-11-24 ENCOUNTER — Encounter (INDEPENDENT_AMBULATORY_CARE_PROVIDER_SITE_OTHER): Payer: Self-pay | Admitting: Vascular Surgery

## 2019-11-27 ENCOUNTER — Emergency Department: Payer: Medicare Other

## 2019-11-27 ENCOUNTER — Inpatient Hospital Stay
Admission: EM | Admit: 2019-11-27 | Discharge: 2019-11-30 | DRG: 603 | Disposition: A | Discharge status: Home Health | Payer: Medicare Other | Attending: Internal Medicine | Admitting: Internal Medicine

## 2019-11-27 ENCOUNTER — Other Ambulatory Visit: Payer: Self-pay

## 2019-11-27 ENCOUNTER — Encounter: Payer: Self-pay | Admitting: Emergency Medicine

## 2019-11-27 DIAGNOSIS — Z6841 Body Mass Index (BMI) 40.0 and over, adult: Secondary | ICD-10-CM | POA: Diagnosis not present

## 2019-11-27 DIAGNOSIS — N183 Chronic kidney disease, stage 3 unspecified: Secondary | ICD-10-CM | POA: Diagnosis present

## 2019-11-27 DIAGNOSIS — E785 Hyperlipidemia, unspecified: Secondary | ICD-10-CM | POA: Diagnosis present

## 2019-11-27 DIAGNOSIS — L03116 Cellulitis of left lower limb: Secondary | ICD-10-CM | POA: Diagnosis not present

## 2019-11-27 DIAGNOSIS — G4730 Sleep apnea, unspecified: Secondary | ICD-10-CM | POA: Diagnosis present

## 2019-11-27 DIAGNOSIS — I872 Venous insufficiency (chronic) (peripheral): Secondary | ICD-10-CM | POA: Diagnosis present

## 2019-11-27 DIAGNOSIS — I10 Essential (primary) hypertension: Secondary | ICD-10-CM | POA: Diagnosis present

## 2019-11-27 DIAGNOSIS — Z20822 Contact with and (suspected) exposure to covid-19: Secondary | ICD-10-CM | POA: Diagnosis present

## 2019-11-27 DIAGNOSIS — Z79899 Other long term (current) drug therapy: Secondary | ICD-10-CM | POA: Diagnosis not present

## 2019-11-27 DIAGNOSIS — K219 Gastro-esophageal reflux disease without esophagitis: Secondary | ICD-10-CM | POA: Diagnosis present

## 2019-11-27 DIAGNOSIS — Z833 Family history of diabetes mellitus: Secondary | ICD-10-CM | POA: Diagnosis not present

## 2019-11-27 DIAGNOSIS — I130 Hypertensive heart and chronic kidney disease with heart failure and stage 1 through stage 4 chronic kidney disease, or unspecified chronic kidney disease: Secondary | ICD-10-CM | POA: Diagnosis present

## 2019-11-27 DIAGNOSIS — E1129 Type 2 diabetes mellitus with other diabetic kidney complication: Secondary | ICD-10-CM | POA: Diagnosis present

## 2019-11-27 DIAGNOSIS — E11621 Type 2 diabetes mellitus with foot ulcer: Secondary | ICD-10-CM | POA: Diagnosis present

## 2019-11-27 DIAGNOSIS — E669 Obesity, unspecified: Secondary | ICD-10-CM | POA: Diagnosis present

## 2019-11-27 DIAGNOSIS — L89312 Pressure ulcer of right buttock, stage 2: Secondary | ICD-10-CM | POA: Diagnosis present

## 2019-11-27 DIAGNOSIS — N1831 Chronic kidney disease, stage 3a: Secondary | ICD-10-CM | POA: Diagnosis present

## 2019-11-27 DIAGNOSIS — L03119 Cellulitis of unspecified part of limb: Secondary | ICD-10-CM | POA: Diagnosis not present

## 2019-11-27 DIAGNOSIS — L97429 Non-pressure chronic ulcer of left heel and midfoot with unspecified severity: Secondary | ICD-10-CM | POA: Diagnosis present

## 2019-11-27 DIAGNOSIS — E1122 Type 2 diabetes mellitus with diabetic chronic kidney disease: Secondary | ICD-10-CM | POA: Diagnosis present

## 2019-11-27 DIAGNOSIS — M1990 Unspecified osteoarthritis, unspecified site: Secondary | ICD-10-CM | POA: Diagnosis present

## 2019-11-27 DIAGNOSIS — Z87891 Personal history of nicotine dependence: Secondary | ICD-10-CM | POA: Diagnosis not present

## 2019-11-27 DIAGNOSIS — Z8249 Family history of ischemic heart disease and other diseases of the circulatory system: Secondary | ICD-10-CM

## 2019-11-27 DIAGNOSIS — Z87440 Personal history of urinary (tract) infections: Secondary | ICD-10-CM | POA: Diagnosis not present

## 2019-11-27 DIAGNOSIS — D509 Iron deficiency anemia, unspecified: Secondary | ICD-10-CM | POA: Diagnosis present

## 2019-11-27 DIAGNOSIS — Z794 Long term (current) use of insulin: Secondary | ICD-10-CM | POA: Diagnosis not present

## 2019-11-27 DIAGNOSIS — I5033 Acute on chronic diastolic (congestive) heart failure: Secondary | ICD-10-CM | POA: Diagnosis present

## 2019-11-27 DIAGNOSIS — I5032 Chronic diastolic (congestive) heart failure: Secondary | ICD-10-CM | POA: Diagnosis present

## 2019-11-27 DIAGNOSIS — L89150 Pressure ulcer of sacral region, unstageable: Secondary | ICD-10-CM | POA: Diagnosis present

## 2019-11-27 DIAGNOSIS — A419 Sepsis, unspecified organism: Secondary | ICD-10-CM

## 2019-11-27 DIAGNOSIS — K7460 Unspecified cirrhosis of liver: Secondary | ICD-10-CM | POA: Diagnosis present

## 2019-11-27 DIAGNOSIS — E1159 Type 2 diabetes mellitus with other circulatory complications: Secondary | ICD-10-CM | POA: Diagnosis present

## 2019-11-27 DIAGNOSIS — N179 Acute kidney failure, unspecified: Secondary | ICD-10-CM | POA: Diagnosis present

## 2019-11-27 LAB — COMPREHENSIVE METABOLIC PANEL
ALT: 29 U/L (ref 0–44)
AST: 31 U/L (ref 15–41)
Albumin: 2.6 g/dL — ABNORMAL LOW (ref 3.5–5.0)
Alkaline Phosphatase: 84 U/L (ref 38–126)
Anion gap: 5 (ref 5–15)
BUN: 45 mg/dL — ABNORMAL HIGH (ref 8–23)
CO2: 22 mmol/L (ref 22–32)
Calcium: 8.4 mg/dL — ABNORMAL LOW (ref 8.9–10.3)
Chloride: 108 mmol/L (ref 98–111)
Creatinine, Ser: 2.43 mg/dL — ABNORMAL HIGH (ref 0.61–1.24)
GFR calc Af Amer: 31 mL/min — ABNORMAL LOW (ref >60)
GFR calc non Af Amer: 27 mL/min — ABNORMAL LOW (ref >60)
Glucose, Bld: 228 mg/dL — ABNORMAL HIGH (ref 70–99)
Potassium: 3.6 mmol/L (ref 3.5–5.1)
Sodium: 135 mmol/L (ref 135–145)
Total Bilirubin: 0.9 mg/dL (ref 0.3–1.2)
Total Protein: 6.4 g/dL — ABNORMAL LOW (ref 6.5–8.1)

## 2019-11-27 LAB — CBC WITH DIFFERENTIAL/PLATELET
Abs Immature Granulocytes: 0.15 10*3/uL — ABNORMAL HIGH (ref 0.00–0.07)
Basophils Absolute: 0 10*3/uL (ref 0.0–0.1)
Basophils Relative: 0 %
Eosinophils Absolute: 0 10*3/uL (ref 0.0–0.5)
Eosinophils Relative: 0 %
HCT: 25.2 % — ABNORMAL LOW (ref 39.0–52.0)
Hemoglobin: 8.1 g/dL — ABNORMAL LOW (ref 13.0–17.0)
Immature Granulocytes: 1 %
Lymphocytes Relative: 4 %
Lymphs Abs: 0.7 10*3/uL (ref 0.7–4.0)
MCH: 30.2 pg (ref 26.0–34.0)
MCHC: 32.1 g/dL (ref 30.0–36.0)
MCV: 94 fL (ref 80.0–100.0)
Monocytes Absolute: 0.7 10*3/uL (ref 0.1–1.0)
Monocytes Relative: 4 %
Neutro Abs: 17.1 10*3/uL — ABNORMAL HIGH (ref 1.7–7.7)
Neutrophils Relative %: 91 %
Platelets: 157 10*3/uL (ref 150–400)
RBC: 2.68 MIL/uL — ABNORMAL LOW (ref 4.22–5.81)
RDW: 15.1 % (ref 11.5–15.5)
WBC: 18.7 10*3/uL — ABNORMAL HIGH (ref 4.0–10.5)
nRBC: 0 % (ref 0.0–0.2)

## 2019-11-27 LAB — AMMONIA: Ammonia: 47 umol/L — ABNORMAL HIGH (ref 9–35)

## 2019-11-27 LAB — LACTIC ACID, PLASMA
Lactic Acid, Venous: 1.5 mmol/L (ref 0.5–1.9)
Lactic Acid, Venous: 1.5 mmol/L (ref 0.5–1.9)

## 2019-11-27 LAB — APTT: aPTT: 36 seconds (ref 24–36)

## 2019-11-27 LAB — GLUCOSE, CAPILLARY: Glucose-Capillary: 208 mg/dL — ABNORMAL HIGH (ref 70–99)

## 2019-11-27 LAB — BRAIN NATRIURETIC PEPTIDE: B Natriuretic Peptide: 266 pg/mL — ABNORMAL HIGH (ref 0.0–100.0)

## 2019-11-27 LAB — SEDIMENTATION RATE: Sed Rate: 108 mm/hr — ABNORMAL HIGH (ref 0–20)

## 2019-11-27 LAB — PROTIME-INR
INR: 1.3 — ABNORMAL HIGH (ref 0.8–1.2)
Prothrombin Time: 16.5 seconds — ABNORMAL HIGH (ref 11.4–15.2)

## 2019-11-27 MED ORDER — SODIUM CHLORIDE 0.9 % IV SOLN
2.0000 g | Freq: Once | INTRAVENOUS | Status: AC
  Administered 2019-11-27: 16:00:00 2 g via INTRAVENOUS
  Filled 2019-11-27: qty 2

## 2019-11-27 MED ORDER — HEPARIN SODIUM (PORCINE) 5000 UNIT/ML IJ SOLN
5000.0000 [IU] | Freq: Three times a day (TID) | INTRAMUSCULAR | Status: DC
  Administered 2019-11-27 – 2019-11-30 (×8): 5000 [IU] via SUBCUTANEOUS
  Filled 2019-11-27 (×8): qty 1

## 2019-11-27 MED ORDER — VANCOMYCIN HCL IN DEXTROSE 1-5 GM/200ML-% IV SOLN
1000.0000 mg | Freq: Once | INTRAVENOUS | Status: AC
  Administered 2019-11-27: 17:00:00 1000 mg via INTRAVENOUS
  Filled 2019-11-27: qty 200

## 2019-11-27 MED ORDER — MORPHINE SULFATE (PF) 2 MG/ML IV SOLN: 2.0000 mg | INTRAVENOUS | Status: DC | PRN

## 2019-11-27 MED ORDER — FERROUS SULFATE 325 (65 FE) MG PO TABS
325.0000 mg | ORAL_TABLET | Freq: Two times a day (BID) | ORAL | Status: DC
  Administered 2019-11-28 – 2019-11-29 (×4): 325 mg via ORAL
  Filled 2019-11-27 (×4): qty 1

## 2019-11-27 MED ORDER — ONDANSETRON HCL 4 MG/2ML IJ SOLN: 4.0000 mg | Freq: Three times a day (TID) | INTRAMUSCULAR | Status: DC | PRN

## 2019-11-27 MED ORDER — OXYCODONE HCL 5 MG PO TABS
10.0000 mg | ORAL_TABLET | Freq: Four times a day (QID) | ORAL | Status: DC | PRN
  Administered 2019-11-28 – 2019-11-30 (×6): 10 mg via ORAL
  Filled 2019-11-27 (×6): qty 2

## 2019-11-27 MED ORDER — ADULT MULTIVITAMIN W/MINERALS CH
1.0000 | ORAL_TABLET | Freq: Every day | ORAL | Status: DC
  Administered 2019-11-28 – 2019-11-29 (×2): 1 via ORAL
  Filled 2019-11-27 (×2): qty 1

## 2019-11-27 MED ORDER — INSULIN ASPART PROT & ASPART (70-30 MIX) 100 UNIT/ML ~~LOC~~ SUSP
64.0000 [IU] | Freq: Two times a day (BID) | SUBCUTANEOUS | Status: DC
  Administered 2019-11-28: 64 [IU] via SUBCUTANEOUS
  Filled 2019-11-27: qty 10

## 2019-11-27 MED ORDER — LORATADINE 10 MG PO TABS
10.0000 mg | ORAL_TABLET | Freq: Every day | ORAL | Status: DC
  Administered 2019-11-28 – 2019-11-29 (×2): 10 mg via ORAL
  Filled 2019-11-27 (×2): qty 1

## 2019-11-27 MED ORDER — VANCOMYCIN HCL IN DEXTROSE 1-5 GM/200ML-% IV SOLN
1000.0000 mg | INTRAVENOUS | Status: DC
  Administered 2019-11-28: 18:00:00 1000 mg via INTRAVENOUS
  Filled 2019-11-27 (×2): qty 200

## 2019-11-27 MED ORDER — HYDRALAZINE HCL 25 MG PO TABS: 25.0000 mg | ORAL_TABLET | Freq: Three times a day (TID) | ORAL | Status: DC | PRN

## 2019-11-27 MED ORDER — INSULIN ASPART PROT & ASPART (70-30 MIX) 100 UNIT/ML ~~LOC~~ SUSP: 64.0000 [IU] | Freq: Two times a day (BID) | SUBCUTANEOUS | Status: DC

## 2019-11-27 MED ORDER — INSULIN ASPART 100 UNIT/ML ~~LOC~~ SOLN
0.0000 [IU] | SUBCUTANEOUS | Status: DC
  Administered 2019-11-27: 5 [IU] via SUBCUTANEOUS
  Administered 2019-11-28: 12:00:00 1 [IU] via SUBCUTANEOUS
  Administered 2019-11-28: 05:00:00 2 [IU] via SUBCUTANEOUS
  Administered 2019-11-28: 1 [IU] via SUBCUTANEOUS
  Administered 2019-11-28: 01:00:00 3 [IU] via SUBCUTANEOUS
  Filled 2019-11-27 (×5): qty 1

## 2019-11-27 MED ORDER — VANCOMYCIN HCL 1500 MG/300ML IV SOLN
1500.0000 mg | Freq: Once | INTRAVENOUS | Status: AC
  Administered 2019-11-27: 22:00:00 1500 mg via INTRAVENOUS
  Filled 2019-11-27: qty 300

## 2019-11-27 MED ORDER — LORAZEPAM 0.5 MG PO TABS
0.5000 mg | ORAL_TABLET | Freq: Three times a day (TID) | ORAL | Status: DC | PRN
  Administered 2019-11-29: 13:00:00 0.5 mg via ORAL
  Filled 2019-11-27: qty 1

## 2019-11-27 MED ORDER — FUROSEMIDE 40 MG PO TABS
80.0000 mg | ORAL_TABLET | Freq: Two times a day (BID) | ORAL | Status: DC
  Administered 2019-11-28 – 2019-11-29 (×4): 80 mg via ORAL
  Filled 2019-11-27 (×4): qty 2

## 2019-11-27 MED ORDER — LACTULOSE 10 GM/15ML PO SOLN
30.0000 g | Freq: Four times a day (QID) | ORAL | Status: DC
  Administered 2019-11-27 – 2019-11-29 (×7): 30 g via ORAL
  Filled 2019-11-27 (×9): qty 60

## 2019-11-27 MED ORDER — SODIUM CHLORIDE 0.9 % IV SOLN
2.0000 g | INTRAVENOUS | Status: DC
  Administered 2019-11-28 – 2019-11-29 (×2): 2 g via INTRAVENOUS
  Filled 2019-11-27: qty 2
  Filled 2019-11-27: qty 20
  Filled 2019-11-27: qty 2

## 2019-11-27 MED ORDER — PANTOPRAZOLE SODIUM 40 MG PO TBEC
40.0000 mg | DELAYED_RELEASE_TABLET | Freq: Two times a day (BID) | ORAL | Status: DC
  Administered 2019-11-28 – 2019-11-29 (×4): 40 mg via ORAL
  Filled 2019-11-27 (×4): qty 1

## 2019-11-27 NOTE — H&P (Signed)
History and Physical    Corey West VCB:[id number] DOB: [birth year] DOA: 11/27/2019  Referring MD/NP/PA:   PCP: Baxter Hire, MD   Patient coming from:  The patient is coming from home.  At baseline, pt is independent for most of ADL.        Chief Complaint: leg pain and swelling, leg wound  HPI: Corey West is a 69 y.o. male with medical history significant of hypertension, hyperlipidemia, diabetes mellitus, chronic lower extremity venous insufficiency change, GERD, anxiety, iron deficiency anemia, liver cirrhosis, AVM, dCHF, GI bleeding, CKD-3, who presents with leg pain and swelling, leg wound.  Pt states that he has chronic bilateral lower extremity venous insufficiency, and chronic leg swelling and chronic left leg wound. He has been followed up with wound care center. He states that his leg pain and swelling has been progressively worsening in the last week, particularly in the left lower leg.  He has multiple wounds in the left leg with fluid oozing. Patient states that he is not currently on antibiotics right now. He states that his wound care nurse came to visit him earlier today and was concerned about cellulitis.  Patient does not have chest pain, shortness of breath.  Patient has loose stools due to lactulose use.  No nausea, vomiting or abdominal pain no symptoms of UTI or unilateral weakness.  ED Course: pt was found to have WBC 18.7, pending COVID-19 PCR, lactic acid 1.5, stable renal function, temperature 99.5, blood pressure 178/58, heart rate 82, oxygen saturation 99% on room air.  Lower extremity venous Doppler of left leg is negative for DVT.  Patient is admitted to [facility] bed as inpatient.  Review of Systems:   General: no fevers, chills, no body weight gain,  has fatigue HEENT: no blurry vision, hearing changes or sore throat Respiratory: no dyspnea, coughing, wheezing CV: no chest pain, no palpitations GI: no nausea, vomiting, abdominal pain, diarrhea,  constipation GU: no dysuria, burning on urination, increased urinary frequency, hematuria  Ext: has leg edema Neuro: no unilateral weakness, numbness, or tingling, no vision change or hearing loss Skin: no rash. Has chronic wounds in left lower leg MSK: No muscle spasm, no deformity, no limitation of range of movement in spin Heme: No easy bruising.  Travel history: No recent long distant travel.  Allergy: No Known Allergies  Past Medical History:  Diagnosis Date  . Anemia   . Anxiety    controlled;   . Arthritis   . AVM (arteriovenous malformation) of stomach, acquired with hemorrhage   . Barrett's esophagus   . Chronic kidney disease    renal infufficiency  . Cirrhosis ([place])   . Depression    controlled;   Marland Kitchen Diabetes mellitus without complication ([place])    not controlled, taking insulin but sugar continues to run high;   . Edema   . Esophageal varices ([place])   . GAVE (gastric antral vascular ectasia)   . GERD (gastroesophageal reflux disease)   . History of hiatal hernia   . Hyperlipidemia   . Hypertension    controlled well;   . Liver cirrhosis ([place])   . Nephropathy, diabetic ([place])   . Obesity   . Pancytopenia ([place])   . Polyp, stomach    with chronic blood loss  . Sleep apnea    does not wear a cpap, Medicare would not pay for it   . Venous stasis dermatitis of both lower extremities   . Venous stasis of both lower extremities  with cellulitis    Past Surgical History:  Procedure Laterality Date  . ESOPHAGOGASTRODUODENOSCOPY N/A 02/09/2015   Procedure: ESOPHAGOGASTRODUODENOSCOPY (EGD);  Surgeon: Manya Silvas, MD;  Location: [facility] ENDOSCOPY;  Service: Endoscopy;  Laterality: N/A;  . ESOPHAGOGASTRODUODENOSCOPY N/A 07/22/2015   Procedure: ESOPHAGOGASTRODUODENOSCOPY (EGD);  Surgeon: Manya Silvas, MD;  Location: [facility] ENDOSCOPY;  Service: Endoscopy;  Laterality: N/A;  . ESOPHAGOGASTRODUODENOSCOPY N/A 06/26/2017   Procedure: ESOPHAGOGASTRODUODENOSCOPY (EGD);   Surgeon: Manya Silvas, MD;  Location[facility] ENDOSCOPY;  Service: Endoscopy;  Laterality: N/A;  . ESOPHAGOGASTRODUODENOSCOPY N/A 06/27/2017   Procedure: ESOPHAGOGASTRODUODENOSCOPY (EGD);  Surgeon: Manya Silvas, MD;  Location: [facility] ENDOSCOPY;  Service: Endoscopy;  Laterality: N/A;  . ESOPHAGOGASTRODUODENOSCOPY N/A 06/28/2017   Procedure: ESOPHAGOGASTRODUODENOSCOPY (EGD);  Surgeon: Manya Silvas, MD;  Location: [facility] ENDOSCOPY;  Service: Endoscopy;  Laterality: N/A;  . ESOPHAGOGASTRODUODENOSCOPY Bilateral 10/11/2017   Procedure: ESOPHAGOGASTRODUODENOSCOPY (EGD);  Surgeon: Manya Silvas, MD;  Location: [facility] ENDOSCOPY;  Service: Endoscopy;  Laterality: Bilateral;  . ESOPHAGOGASTRODUODENOSCOPY N/A 12/04/2017   Procedure: ESOPHAGOGASTRODUODENOSCOPY (EGD);  Surgeon: Manya Silvas, MD;  Location: [facility] ENDOSCOPY;  Service: Endoscopy;  Laterality: N/A;  . ESOPHAGOGASTRODUODENOSCOPY N/A 03/06/2019   Procedure: ESOPHAGOGASTRODUODENOSCOPY (EGD);  Surgeon: Toledo, Benay Pike, MD;  Location: [facility] ENDOSCOPY;  Service: Gastroenterology;  Laterality: N/A;  . ESOPHAGOGASTRODUODENOSCOPY (EGD) WITH PROPOFOL N/A 07/20/2015   Procedure: ESOPHAGOGASTRODUODENOSCOPY (EGD) WITH PROPOFOL;  Surgeon: Manya Silvas, MD;  Location: [facility] ENDOSCOPY;  Service: Endoscopy;  Laterality: N/A;  . ESOPHAGOGASTRODUODENOSCOPY (EGD) WITH PROPOFOL N/A 09/16/2015   Procedure: ESOPHAGOGASTRODUODENOSCOPY (EGD) WITH PROPOFOL;  Surgeon: Manya Silvas, MD;  Location: [facility] ENDOSCOPY;  Service: Endoscopy;  Laterality: N/A;  . ESOPHAGOGASTRODUODENOSCOPY (EGD) WITH PROPOFOL N/A 03/16/2016   Procedure: ESOPHAGOGASTRODUODENOSCOPY (EGD) WITH PROPOFOL;  Surgeon: Manya Silvas, MD;  Location: [facility] ENDOSCOPY;  Service: Endoscopy;  Laterality: N/A;  . ESOPHAGOGASTRODUODENOSCOPY (EGD) WITH PROPOFOL N/A 09/14/2016   Procedure: ESOPHAGOGASTRODUODENOSCOPY (EGD) WITH PROPOFOL;  Surgeon: Manya Silvas, MD;  Location: [facility] ENDOSCOPY;  Service:  Endoscopy;  Laterality: N/A;  . ESOPHAGOGASTRODUODENOSCOPY (EGD) WITH PROPOFOL N/A 11/05/2016   Procedure: ESOPHAGOGASTRODUODENOSCOPY (EGD) WITH PROPOFOL;  Surgeon: Manya Silvas, MD;  Location: [facility] ENDOSCOPY;  Service: Endoscopy;  Laterality: N/A;  . ESOPHAGOGASTRODUODENOSCOPY (EGD) WITH PROPOFOL N/A 02/06/2017   Procedure: ESOPHAGOGASTRODUODENOSCOPY (EGD) WITH PROPOFOL;  Surgeon: Manya Silvas, MD;  Location: [facility] ENDOSCOPY;  Service: Endoscopy;  Laterality: N/A;  . ESOPHAGOGASTRODUODENOSCOPY (EGD) WITH PROPOFOL N/A 04/24/2017   Procedure: ESOPHAGOGASTRODUODENOSCOPY (EGD) WITH PROPOFOL;  Surgeon: Manya Silvas, MD;  Location: [facility] ENDOSCOPY;  Service: Endoscopy;  Laterality: N/A;  . ESOPHAGOGASTRODUODENOSCOPY (EGD) WITH PROPOFOL N/A 07/31/2017   Procedure: ESOPHAGOGASTRODUODENOSCOPY (EGD) WITH PROPOFOL;  Surgeon: Manya Silvas, MD;  Location: [facility] ENDOSCOPY;  Service: Endoscopy;  Laterality: N/A;  . ESOPHAGOGASTRODUODENOSCOPY (EGD) WITH PROPOFOL N/A 08/08/2018   Procedure: ESOPHAGOGASTRODUODENOSCOPY (EGD) WITH PROPOFOL;  Surgeon: Manya Silvas, MD;  Location: [facility] ENDOSCOPY;  Service: Endoscopy;  Laterality: N/A;  . GIVENS CAPSULE STUDY N/A 12/05/2017   Procedure: GIVENS CAPSULE STUDY;  Surgeon: Manya Silvas, MD;  Location: [facility] ENDOSCOPY;  Service: Endoscopy;  Laterality: N/A;  . TONSILLECTOMY    . TONSILLECTOMY AND ADENOIDECTOMY    . ULNAR NERVE TRANSPOSITION    . UVULOPALATOPHARYNGOPLASTY      Social History:  reports that he has quit smoking. His smoking use included cigars and cigarettes. He quit after 20.00 years of use. He has never used smokeless tobacco. He reports that he does not drink alcohol or use drugs.  Family History:  Family History  Problem Relation Age of Onset  . Diabetes Other   . Transient ischemic attack Father   . CAD Father      Prior to Admission medications   Medication Sig Start Date End Date Taking? Authorizing Provider  cetirizine  (ZYRTEC) 10 MG tablet Take 10 mg by mouth daily.   Yes [provider]  ferrous sulfate 325 (65 FE) MG tablet Take 325 mg by mouth 2 (two) times daily with a meal.    Yes [provider]  furosemide (LASIX) 40 MG tablet Take 80 mg by mouth 2 (two) times a day.    Yes [provider]  insulin aspart protamine - aspart (NOVOLOG 70/30 MIX) (70-30) 100 UNIT/ML FlexPen Inject 84 Units into the skin 2 (two) times daily.    Yes [provider]  lactulose (CHRONULAC) 10 GM/15ML solution Take 30 g by mouth 4 (four) times daily.    Yes [provider]  LORazepam (ATIVAN) 0.5 MG tablet Take 1 tablet (0.5 mg total) by mouth every 8 (eight) hours as needed for anxiety. 10/18/18  Yes Gladstone Lighter, MD  Multiple Vitamin (MULTIVITAMIN) tablet Take 1 tablet by mouth daily.   Yes [provider]  pantoprazole (PROTONIX) 40 MG tablet Take 1 tablet (40 mg total) by mouth 2 (two) times daily. 03/06/19 03/05/20 Yes Dustin Flock, MD  Potassium Chloride ER 20 MEQ TBCR Take 20 mEq by mouth 2 (two) times daily. While on lasix' Patient taking differently: Take 20 mEq by mouth 2 (two) times daily.  10/18/18  Yes Gladstone Lighter, MD  traMADol (ULTRAM) 50 MG tablet Take 50 mg by mouth every 6 (six) hours as needed for moderate pain.    Yes [provider]  metolazone (ZAROXOLYN) 5 MG tablet Take 5 mg by mouth daily.    [provider]    Physical Exam: Vitals:   11/27/19 1500 11/27/19 1510 11/27/19 1530 11/27/19 1706  BP: (!) 178/58  (!) 174/69 (!) 177/59  Pulse:  82 87 83  Resp:    (!) 22  Temp:      TempSrc:      SpO2:  99% 100% 100%  Weight:      Height:       General: Not in acute distress HEENT:       Eyes: PERRL, EOMI, no scleral icterus.       ENT: No discharge from the ears and nose, no pharynx injection, no tonsillar enlargement.        Neck: No JVD, no bruit, no mass felt. Heme: No neck lymph node enlargement. Cardiac: S1/S2,  RRR, No murmurs, No gallops or rubs. Respiratory:  No rales, wheezing, rhonchi or rubs. GI: Soft, nondistended, nontender, no rebound pain, no organomegaly, BS present. GU: No hematuria Ext: Has bilateral lower leg swelling, worse on the left leg.  Has multiple chronic wounds in the left lower leg.  The right leg is mildly erythematous and mild tender.  The left leg is extensively erythematous, swollen and tender. Has warmth in left lower leg. Musculoskeletal: No joint deformities, No joint redness or warmth, no limitation of ROM in spin. Skin: No rashes.  Has multiple chronic wounds in left lower leg Neuro: Alert, oriented X3, cranial nerves II-XII grossly intact, moves all extremities normally. Psych: Patient is not psychotic, no suicidal or hemocidal ideation.  Labs on Admission: I have personally reviewed following labs and imaging studies  CBC: Recent Labs  Lab 11/27/19 1236  WBC 18.7*  NEUTROABS 17.1*  HGB 8.1*  HCT 25.2*  MCV 94.0  PLT 546   Basic Metabolic Panel: Recent Labs  Lab 11/27/19 1236  NA 135  K 3.6  CL 108  CO2 22  GLUCOSE 228*  BUN 45*  CREATININE 2.43*  CALCIUM 8.4*   GFR: Estimated Creatinine Clearance: 42.1 mL/min (A) (by C-G formula based on SCr of 2.43 mg/dL (H)). Liver Function Tests: Recent Labs  Lab 11/27/19 1236  AST 31  ALT 29  ALKPHOS 84  BILITOT 0.9  PROT 6.4*  ALBUMIN 2.6*   No results for input(s): LIPASE, AMYLASE in the last 168 hours. No results for input(s): AMMONIA in the last 168 hours. Coagulation Profile: No results for input(s): INR, PROTIME in the last 168 hours. Cardiac Enzymes: No results for input(s): CKTOTAL, CKMB, CKMBINDEX, TROPONINI in the last 168 hours. BNP (last 3 results) No results for input(s): PROBNP in the last 8760 hours. HbA1C: No results for input(s): HGBA1C in the last 72 hours. CBG: Recent Labs  Lab 11/27/19 1235  GLUCAP 208*   Lipid Profile: No results for input(s): CHOL, HDL, LDLCALC,  TRIG, CHOLHDL, LDLDIRECT in the last 72 hours. Thyroid Function Tests: No results for input(s): TSH, T4TOTAL, FREET4, T3FREE, THYROIDAB in the last 72 hours. Anemia Panel: No results for input(s): VITAMINB12, FOLATE, FERRITIN, TIBC, IRON, RETICCTPCT in the last 72 hours. Urine analysis:    Component Value Date/Time   COLORURINE YELLOW (A) 03/14/2019 2210   APPEARANCEUR HAZY (A) 03/14/2019 2210   APPEARANCEUR Clear 09/10/2014 0456   LABSPEC 1.016 03/14/2019 2210   LABSPEC 1.016 09/10/2014 0456   PHURINE 5.0 03/14/2019 2210   GLUCOSEU NEGATIVE 03/14/2019 2210   GLUCOSEU Negative 09/10/2014 0456   HGBUR SMALL (A) 03/14/2019 2210   BILIRUBINUR NEGATIVE 03/14/2019 2210   BILIRUBINUR Negative 09/10/2014 0456   KETONESUR NEGATIVE 03/14/2019 2210   PROTEINUR 100 (A) 03/14/2019 2210   NITRITE NEGATIVE 03/14/2019 2210   LEUKOCYTESUR NEGATIVE 03/14/2019 2210   LEUKOCYTESUR Negative 09/10/2014 0456   Sepsis Labs: '@LABRCNTIP'$ (procalcitonin:4,lacticidven:4) )No results found for this or any previous visit (from the past 240 hour(s)).   Radiological Exams on Admission: US Venous Img Lower Unilateral Left (DVT)  Result Date: 11/27/2019 CLINICAL DATA:  Left leg swelling.  Concern for DVT. EXAM: LEFT LOWER EXTREMITY VENOUS DOPPLER ULTRASOUND TECHNIQUE: Gray-scale sonography with graded compression, as well as color Doppler and duplex ultrasound were performed to evaluate the lower extremity deep venous systems from the level of the common femoral vein and including the common femoral, femoral, profunda femoral, popliteal and calf veins including the posterior tibial, peroneal and gastrocnemius veins when visible. The superficial great saphenous vein was also interrogated. Spectral Doppler was utilized to evaluate flow at rest and with distal augmentation maneuvers in the common femoral, femoral and popliteal veins. COMPARISON:  None. FINDINGS: Contralateral Common Femoral Vein: Respiratory phasicity is  normal and symmetric with the symptomatic side. No evidence of thrombus. Normal compressibility. Common Femoral Vein: No evidence of thrombus. Normal compressibility, respiratory phasicity and response to augmentation. Saphenofemoral Junction: No evidence of thrombus. Normal compressibility and flow on color Doppler imaging. Profunda Femoral Vein: No evidence of thrombus. Normal compressibility and flow on color Doppler imaging. Femoral Vein: No evidence of thrombus. Normal compressibility, respiratory phasicity and response to augmentation. Popliteal Vein: No evidence of thrombus. Normal compressibility, respiratory phasicity and response to augmentation. Calf Veins: The posterior tibial vein is unremarkable. The peroneal vein was not visualized. Superficial Great Saphenous Vein: No evidence of thrombus. Normal  compressibility. Venous Reflux:  Not evaluated. Other Findings:  Left lower extremity edema is noted. IMPRESSION: 1. No DVT identified. 2. There is nonspecific lower extremity edema. Correlation with physical exam is recommended. Electronically Signed   By: Constance Holster M.D.   On: 11/27/2019 17:00     EKG: Not done in ED, will get one.   Assessment/Plan Principal Problem:   Cellulitis of lower extremity Active Problems:   Cirrhosis (HCC)   Gastroesophageal reflux disease   Hyperlipidemia   Iron deficiency anemia   CKD (chronic kidney disease), stage IIIa   Hypertension   Chronic diastolic CHF (congestive heart failure) (HCC)   Type II diabetes mellitus with renal manifestations (HCC)   Cellulitis of lower extremity: left leg is worse than the left.  - will admit to med-surg bed as inpt - Empiric antimicrobial treatment with vancomycin and rocephin (patient received 1 dose of cefepime in ED) - PRN Zofran for nausea, and oxycodone for pain - Blood cultures x 2  - ESR and CRP - wound care consult  Cirrhosis [facility]): Mental status normal -continue home lactulose -Check ammonia  level, INR and PTT  Gastroesophageal reflux disease -Protonix  Hyperlipidemia -Lipitor  Iron deficiency anemia -Continue iron supplement  CKD (chronic kidney disease), stage IIIa: Stable.  Baseline creatinine 2.0-2.5.  His creatinine is 2.43, BUN 45 -Follow-up renal function with BMP  HTN:  -Continue home medications: lasix which is also for CHF -hydralazine prn  Chronic diastolic CHF (congestive heart failure) ([place]): 2D echo on 10/16/2018 showed EF of 55-60% with grade 2 diastolic dysfunction.  Patient does not have shortness of breath.  Does not seem to have CHF exacerbation.  His bilateral lower leg edema is likely due to cellulitis and chronic venous insufficiency change. -continue home dose lasix  Type II diabetes mellitus with renal manifestations ([place]): Most recent A1c 7.5, poorly controled. Patient is taking 70/30 insulin at home -will decrease 70/30 insulin dose from 84 to 64 units twice daily -SSI    Inpatient status:  # Patient requires inpatient status due to high intensity of service, high risk for further deterioration and high frequency of surveillance required.  I certify that at the point of admission it is my clinical judgment that the patient will require inpatient hospital care spanning beyond 2 midnights from the point of admission.  . This patient has multiple chronic comorbidities including hypertension, hyperlipidemia, diabetes mellitus, chronic lower extremity venous insufficiency change, GERD, anxiety, iron deficiency anemia, liver cirrhosis, AVM, dCHF, GI bleeding, CKD-3. . Now patient has presenting with lower extremities cellulitis with chronic wound in left leg. . The worrisome physical exam findings: Has bilateral lower leg swelling, worse on the left leg.  Has multiple chronic wounds in the left lower leg.  The right leg is mildly erythematous and mild tender.  The left leg is extensively erythematous, swollen and tender. Has warmth in left lower  leg. . The initial radiographic and laboratory data are worrisome because of leukocytosis with WBC 18.7. Marland Kitchen Current medical needs: please see my assessment and plan Predictability of an adverse outcome (risk): Patient has multiple comorbidities as listed above.Now presents with lower extremities cellulitis with chronic wound in left leg. Patient's presentation is highly complicated.  Patient is at high risk of deteriorating.  Will need to be treated in hospital for at least 2 days.      DVT ppx: SQ Heparin  Code Status: Full code Family Communication: not done, no family member is at bed side.  Disposition Plan:  Anticipate discharge back to previous home environment Consults called:  None Admission status: Med-surg bed as inpt      Date of Service 11/27/2019    [facility]   If 7PM-7AM, please contact night-coverage [email or web address] 11/27/2019, 5:56 PM

## 2019-11-27 NOTE — Consult Note (Signed)
Pharmacy Antibiotic Note  Corey West is a 69 y.o. male admitted on 11/27/2019 with cellulitis.  Pharmacy has been consulted for vancomycin dosing.  Plan: Vancomycin 1000 mg x 1 ordered. Will give another vancomycin 1500 mg for a total of 2500 mg loading dose. Followed by vancomycin 1000 mg q24H. Predicted AUC 469. Goal AUC 400-550. Scr used 2.43 (seems to be baseline). Plan for level in 4-5 doses. F/u with Scr.   Height: 6' (182.9 cm) Weight: 300 lb (136.1 kg) IBW/kg (Calculated) : 77.6  Temp (24hrs), Avg:99.5 F (37.5 C), Min:99.5 F (37.5 C), Max:99.5 F (37.5 C)  Recent Labs  Lab 11/27/19 1236 11/27/19 1534  WBC 18.7*  --   CREATININE 2.43*  --   LATICACIDVEN 1.5 1.5    Estimated Creatinine Clearance: 42.1 mL/min (A) (by C-G formula based on SCr of 2.43 mg/dL (H)).    No Known Allergies  Antimicrobials this admission: 2/26 vancomcyin >>  2/26 cepefeime x 1  2/26 ceftriaxone. >>   Dose adjustments this admission: None  Microbiology results: 2/26 BCx: pending  Thank you for allowing pharmacy to be a part of this patient's care.  Oswald Hillock, PharmD, BCPS 11/27/2019 4:18 PM

## 2019-11-27 NOTE — ED Notes (Signed)
Report given to floor Marlana Latus. Informed RN to call with additional questions

## 2019-11-27 NOTE — ED Triage Notes (Signed)
Pt here for evaluation of legs and wounds.  Has una boots to BLE that were changed by home health RN today.  Pt sent for admission.  Hx of diabetes, has not checked his blood sugar in couple days.  Can see redness extending up left leg from una boot to thigh.  Pt reports has been like this for couple days and red under dressing as well.  Pt has pain when moving leg.  No known fevers at home.  reports some mild redness under right leg dressing as well.  C/o significant wound drainage to left leg.  Dressings not undone in triage at this time; denies black color, only redness to legs per pt.

## 2019-11-27 NOTE — Consult Note (Signed)
PHARMACY -  BRIEF ANTIBIOTIC NOTE   Pharmacy has received consult(s) for Wound infection from an ED provider.  The patient's profile has been reviewed for ht/wt/allergies/indication/available labs.    One time order(s) placed for cefepime  Further antibiotics/pharmacy consults should be ordered by admitting physician if indicated.                       Thank you, Oswald Hillock 11/27/2019  3:45 PM

## 2019-11-27 NOTE — Care Management (Signed)
RN CM: Notified by advanced home care patient is active with nursing for wound care.

## 2019-11-27 NOTE — ED Notes (Signed)
Pt states wound care nurse sent to ED for increased redness and swelling to left leg. Pt has swelling and redness noted to left leg, both legs are wrapped at this time. States the increased redness started last night.  Pt appears SHOB on exertion, reports this is chronic.  Reports recently started taking a "fluid pill" within the last few days and believes it has made him dehydrated.  Pt in NAD at this time

## 2019-11-27 NOTE — ED Provider Notes (Signed)
[facility] EMERGENCY DEPARTMENT Provider Note   CSN: [id number] Arrival date & time: 11/27/19  1207     History Chief Complaint  Patient presents with  . Cellulitis    Corey West is a 69 y.o. male hx of cirrhosis, CKD, reflux, diabetes, chronic left leg wound, here presenting with cellulitis of the left leg .  Patient states that for the last week or so he has progressive swelling and redness to the left leg.  He does have a chronic wound in the leg and been followed up with wound care center.  Patient states that he is not currently on antibiotics right now.  He states that his wound care nurse came to visit him earlier today and was concerned that his cellulitis got much worse and will need admission.  Patient states that he has not been checking his blood sugar but was 205 on arrival.  Denies any fevers or chills or any known exposures to Covid.  The history is provided by the patient.       Past Medical History:  Diagnosis Date  . Anemia   . Anxiety    controlled;   . Arthritis   . AVM (arteriovenous malformation) of stomach, acquired with hemorrhage   . Barrett's esophagus   . Chronic kidney disease    renal infufficiency  . Cirrhosis ([place])   . Depression    controlled;   Marland Kitchen Diabetes mellitus without complication ([place])    not controlled, taking insulin but sugar continues to run high;   . Edema   . Esophageal varices ([place])   . GAVE (gastric antral vascular ectasia)   . GERD (gastroesophageal reflux disease)   . History of hiatal hernia   . Hyperlipidemia   . Hypertension    controlled well;   . Liver cirrhosis ([place])   . Nephropathy, diabetic ([place])   . Obesity   . Pancytopenia ([place])   . Polyp, stomach    with chronic blood loss  . Sleep apnea    does not wear a cpap, Medicare would not pay for it   . Venous stasis dermatitis of both lower extremities   . Venous stasis of both lower extremities    with cellulitis    Patient Active  Problem List   Diagnosis Date Noted  . Chronic diastolic CHF (congestive heart failure) ([place]) 11/27/2019  . Type II diabetes mellitus with renal manifestations ([place]) 11/27/2019  . Cellulitis of left lower extremity 11/27/2019  . Cellulitis of lower extremity 11/27/2019  . HCAP (healthcare-associated pneumonia) 02/03/2019  . Aphasia 01/14/2019  . Acute on chronic renal failure ([place]) 01/14/2019  . Malnutrition of moderate degree 12/03/2018  . C. difficile diarrhea 12/02/2018  . Ankle fracture, left, closed, initial encounter 10/15/2018  . Ankle fracture, right, closed, initial encounter 10/15/2018  . Ankle fracture 10/15/2018  . Pressure injury of skin 07/19/2018  . UTI (urinary tract infection) 07/18/2018  . CKD (chronic kidney disease), stage IIIa 01/02/2018  . 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 Goals of care, counseling/discussion   . Palliative care encounter   . Hepatic encephalopathy syndrome ([place]) 12/16/2017  . Hepatic encephalopathy ([place]) 10/18/2017  . Hypokalemia 10/09/2017  . Hyperglycemia 10/09/2017  . Symptomatic anemia 06/26/2017  . Sepsis ([place]) 06/12/2017  . Type 2 diabetes mellitus with diabetic nephropathy, with long-term current use of insulin ([place]) 05/29/2017  . Cellulitis in diabetic foot ([place]) 02/21/2017  . Cellulitis 02/21/2017  . GI bleed 07/20/2015  . Hematemesis  02/08/2015  . Cirrhosis ([place]) 02/08/2015  . Esophageal varices ([place]) 02/08/2015  . Gastroesophageal reflux disease 02/08/2015  . Hyperlipidemia 02/08/2015  . Chronic venous stasis dermatitis of both lower extremities 10/06/2014  . Edema 05/25/2014  . Hypertension 05/25/2014  . Obesity 05/25/2014  . Sleep apnea 05/25/2014  . Barrett's esophagus 05/25/2014  . Pancytopenia ([place]) 05/25/2014  . Iron deficiency anemia 12/04/2013  . Thrombocytopenia ([place]) 12/04/2013  . Splenomegaly 11/04/2013    Past Surgical History:  Procedure Laterality Date  . ESOPHAGOGASTRODUODENOSCOPY N/A  02/09/2015   Procedure: ESOPHAGOGASTRODUODENOSCOPY (EGD);  Surgeon: Manya Silvas, MD;  Location: [facility] ENDOSCOPY;  Service: Endoscopy;  Laterality: N/A;  . ESOPHAGOGASTRODUODENOSCOPY N/A 07/22/2015   Procedure: ESOPHAGOGASTRODUODENOSCOPY (EGD);  Surgeon: Manya Silvas, MD;  Location: [facility] ENDOSCOPY;  Service: Endoscopy;  Laterality: N/A;  . ESOPHAGOGASTRODUODENOSCOPY N/A 06/26/2017   Procedure: ESOPHAGOGASTRODUODENOSCOPY (EGD);  Surgeon: Manya Silvas, MD;  Location: [facility] ENDOSCOPY;  Service: Endoscopy;  Laterality: N/A;  . ESOPHAGOGASTRODUODENOSCOPY N/A 06/27/2017   Procedure: ESOPHAGOGASTRODUODENOSCOPY (EGD);  Surgeon: Manya Silvas, MD;  Location[facility] ENDOSCOPY;  Service: Endoscopy;  Laterality: N/A;  . ESOPHAGOGASTRODUODENOSCOPY N/A 06/28/2017   Procedure: ESOPHAGOGASTRODUODENOSCOPY (EGD);  Surgeon: Manya Silvas, MD;  Location: [facility] ENDOSCOPY;  Service: Endoscopy;  Laterality: N/A;  . ESOPHAGOGASTRODUODENOSCOPY Bilateral 10/11/2017   Procedure: ESOPHAGOGASTRODUODENOSCOPY (EGD);  Surgeon: Manya Silvas, MD;  Location: [facility] ENDOSCOPY;  Service: Endoscopy;  Laterality: Bilateral;  . ESOPHAGOGASTRODUODENOSCOPY N/A 12/04/2017   Procedure: ESOPHAGOGASTRODUODENOSCOPY (EGD);  Surgeon: Manya Silvas, MD;  Location: [facility] ENDOSCOPY;  Service: Endoscopy;  Laterality: N/A;  . ESOPHAGOGASTRODUODENOSCOPY N/A 03/06/2019   Procedure: ESOPHAGOGASTRODUODENOSCOPY (EGD);  Surgeon: Tol[place], Benay Pike, MD;  Location: [facility] ENDOSCOPY;  Service: Gastroenterology;  Laterality: N/A;  . ESOPHAGOGASTRODUODENOSCOPY (EGD) WITH PROPOFOL N/A 07/20/2015   Procedure: ESOPHAGOGASTRODUODENOSCOPY (EGD) WITH PROPOFOL;  Surgeon: Manya Silvas, MD;  Location: [facility] ENDOSCOPY;  Service: Endoscopy;  Laterality: N/A;  . ESOPHAGOGASTRODUODENOSCOPY (EGD) WITH PROPOFOL N/A 09/16/2015   Procedure: ESOPHAGOGASTRODUODENOSCOPY (EGD) WITH PROPOFOL;  Surgeon: Manya Silvas, MD;  Location: [facility] ENDOSCOPY;  Service:  Endoscopy;  Laterality: N/A;  . ESOPHAGOGASTRODUODENOSCOPY (EGD) WITH PROPOFOL N/A 03/16/2016   Procedure: ESOPHAGOGASTRODUODENOSCOPY (EGD) WITH PROPOFOL;  Surgeon: Manya Silvas, MD;  Location: [facility] ENDOSCOPY;  Service: Endoscopy;  Laterality: N/A;  . ESOPHAGOGASTRODUODENOSCOPY (EGD) WITH PROPOFOL N/A 09/14/2016   Procedure: ESOPHAGOGASTRODUODENOSCOPY (EGD) WITH PROPOFOL;  Surgeon: Manya Silvas, MD;  Location: [facility] ENDOSCOPY;  Service: Endoscopy;  Laterality: N/A;  . ESOPHAGOGASTRODUODENOSCOPY (EGD) WITH PROPOFOL N/A 11/05/2016   Procedure: ESOPHAGOGASTRODUODENOSCOPY (EGD) WITH PROPOFOL;  Surgeon: Manya Silvas, MD;  Location: [facility] ENDOSCOPY;  Service: Endoscopy;  Laterality: N/A;  . ESOPHAGOGASTRODUODENOSCOPY (EGD) WITH PROPOFOL N/A 02/06/2017   Procedure: ESOPHAGOGASTRODUODENOSCOPY (EGD) WITH PROPOFOL;  Surgeon: Manya Silvas, MD;  Location: [facility] ENDOSCOPY;  Service: Endoscopy;  Laterality: N/A;  . ESOPHAGOGASTRODUODENOSCOPY (EGD) WITH PROPOFOL N/A 04/24/2017   Procedure: ESOPHAGOGASTRODUODENOSCOPY (EGD) WITH PROPOFOL;  Surgeon: Manya Silvas, MD;  Location: [facility] ENDOSCOPY;  Service: Endoscopy;  Laterality: N/A;  . ESOPHAGOGASTRODUODENOSCOPY (EGD) WITH PROPOFOL N/A 07/31/2017   Procedure: ESOPHAGOGASTRODUODENOSCOPY (EGD) WITH PROPOFOL;  Surgeon: Manya Silvas, MD;  Location: [facility] ENDOSCOPY;  Service: Endoscopy;  Laterality: N/A;  . ESOPHAGOGASTRODUODENOSCOPY (EGD) WITH PROPOFOL N/A 08/08/2018   Procedure: ESOPHAGOGASTRODUODENOSCOPY (EGD) WITH PROPOFOL;  Surgeon: Manya Silvas, MD;  Location: [facility] ENDOSCOPY;  Service: Endoscopy;  Laterality: N/A;  . GIVENS CAPSULE STUDY N/A 12/05/2017   Procedure: GIVENS CAPSULE STUDY;  Surgeon: Manya Silvas, MD;  Location: [facility] ENDOSCOPY;  Service: Endoscopy;  Laterality: N/A;  . TONSILLECTOMY    . TONSILLECTOMY AND ADENOIDECTOMY    . ULNAR NERVE TRANSPOSITION    . UVULOPALATOPHARYNGOPLASTY         Family History  Problem Relation  Age of Onset  . Diabetes Other   . Transient ischemic attack Father   . CAD Father     Social History   Tobacco Use  . Smoking status: Former Smoker    Years: 20.00    Types: Cigars, Cigarettes  . Smokeless tobacco: Never Used  Substance Use Topics  . Alcohol use: No    Comment: stopped 15 years ago  . Drug use: No    Home Medications Prior to Admission medications   Medication Sig Start Date End Date Taking? Authorizing Provider  cetirizine (ZYRTEC) 10 MG tablet Take 10 mg by mouth daily.   Yes [provider]  ferrous sulfate 325 (65 FE) MG tablet Take 325 mg by mouth 2 (two) times daily with a meal.    Yes [provider]  furosemide (LASIX) 40 MG tablet Take 80 mg by mouth 2 (two) times a day.    Yes [provider]  insulin aspart protamine - aspart (NOVOLOG 70/30 MIX) (70-30) 100 UNIT/ML FlexPen Inject 84 Units into the skin 2 (two) times daily.    Yes [provider]  lactulose (CHRONULAC) 10 GM/15ML solution Take 30 g by mouth 4 (four) times daily.    Yes [provider]  LORazepam (ATIVAN) 0.5 MG tablet Take 1 tablet (0.5 mg total) by mouth every 8 (eight) hours as needed for anxiety. 10/18/18  Yes Gladstone Lighter, MD  Multiple Vitamin (MULTIVITAMIN) tablet Take 1 tablet by mouth daily.   Yes [provider]  pantoprazole (PROTONIX) 40 MG tablet Take 1 tablet (40 mg total) by mouth 2 (two) times daily. 03/06/19 03/05/20 Yes Dustin Flock, MD  Potassium Chloride ER 20 MEQ TBCR Take 20 mEq by mouth 2 (two) times daily. While on lasix' Patient taking differently: Take 20 mEq by mouth 2 (two) times daily.  10/18/18  Yes Gladstone Lighter, MD  traMADol (ULTRAM) 50 MG tablet Take 50 mg by mouth every 6 (six) hours as needed for moderate pain.    Yes [provider]  metolazone (ZAROXOLYN) 5 MG tablet Take 5 mg by mouth daily.    [provider]    Allergies    Patient has no known allergies.  Review of  Systems   Review of Systems  Skin: Positive for color change and wound.  All other systems reviewed and are negative.   Physical Exam Updated Vital Signs BP (!) 146/55 (BP Location: Left Arm)   Pulse 72   Temp 98.4 F (36.9 C) (Oral)   Resp (!) 21   Ht 6' (1.829 m)   Wt 136.1 kg   SpO2 100%   BMI 40.69 kg/m   Physical Exam Vitals and nursing note reviewed.  HENT:     Head: Normocephalic.     Mouth/Throat:     Mouth: Mucous membranes are moist.  Eyes:     Extraocular Movements: Extraocular movements intact.     Pupils: Pupils are equal, round, and reactive to light.  Cardiovascular:     Rate and Rhythm: Normal rate.     Pulses: Normal pulses.  Pulmonary:     Effort: Pulmonary effort is normal.  Abdominal:     General: Abdomen is flat.     Palpations: Abdomen is soft.  Musculoskeletal:  Cervical back: Normal range of motion.     Comments: Stage 2 ulcer L heel with cellulitis of entire calf spreading to the thigh. R calf cellulitis as well. 2+ pulses bilaterally   Skin:    General: Skin is warm.     Capillary Refill: Capillary refill takes less than 2 seconds.     Findings: Erythema present.  Neurological:     General: No focal deficit present.     Mental Status: He is alert.  Psychiatric:        Mood and Affect: Mood normal.     ED Results / Procedures / Treatments   Labs (all labs ordered are listed, but only abnormal results are displayed) Labs Reviewed  COMPREHENSIVE METABOLIC PANEL - Abnormal; Notable for the following components:      Result Value   Glucose, Bld 228 (*)    BUN 45 (*)    Creatinine, Ser 2.43 (*)    Calcium 8.4 (*)    Total Protein 6.4 (*)    Albumin 2.6 (*)    GFR calc non Af Amer 27 (*)    GFR calc Af Amer 31 (*)    All other components within normal limits  CBC WITH DIFFERENTIAL/PLATELET - Abnormal; Notable for the following components:   WBC 18.7 (*)    RBC 2.68 (*)    Hemoglobin 8.1 (*)    HCT 25.2 (*)    Neutro Abs 17.1  (*)    Abs Immature Granulocytes 0.15 (*)    All other components within normal limits  GLUCOSE, CAPILLARY - Abnormal; Notable for the following components:   Glucose-Capillary 208 (*)    All other components within normal limits  BRAIN NATRIURETIC PEPTIDE - Abnormal; Notable for the following components:   B Natriuretic Peptide 266.0 (*)    All other components within normal limits  AMMONIA - Abnormal; Notable for the following components:   Ammonia 47 (*)    All other components within normal limits  SEDIMENTATION RATE - Abnormal; Notable for the following components:   Sed Rate 108 (*)    All other components within normal limits  PROTIME-INR - Abnormal; Notable for the following components:   Prothrombin Time 16.5 (*)    INR 1.3 (*)    All other components within normal limits  SARS CORONAVIRUS 2 (TAT 6-24 HRS)  CULTURE, BLOOD (ROUTINE X 2)  CULTURE, BLOOD (ROUTINE X 2)  LACTIC ACID, PLASMA  LACTIC ACID, PLASMA  APTT  HIV ANTIBODY (ROUTINE TESTING W REFLEX)  BASIC METABOLIC PANEL  CBC  C-REACTIVE PROTEIN    EKG None  Radiology US Venous Img Lower Unilateral Left (DVT)  Result Date: 11/27/2019 CLINICAL DATA:  Left leg swelling.  Concern for DVT. EXAM: LEFT LOWER EXTREMITY VENOUS DOPPLER ULTRASOUND TECHNIQUE: Gray-scale sonography with graded compression, as well as color Doppler and duplex ultrasound were performed to evaluate the lower extremity deep venous systems from the level of the common femoral vein and including the common femoral, femoral, profunda femoral, popliteal and calf veins including the posterior tibial, peroneal and gastrocnemius veins when visible. The superficial great saphenous vein was also interrogated. Spectral Doppler was utilized to evaluate flow at rest and with distal augmentation maneuvers in the common femoral, femoral and popliteal veins. COMPARISON:  None. FINDINGS: Contralateral Common Femoral Vein: Respiratory phasicity is normal and  symmetric with the symptomatic side. No evidence of thrombus. Normal compressibility. Common Femoral Vein: No evidence of thrombus. Normal compressibility, respiratory phasicity and response to augmentation. Saphenofemoral  Junction: No evidence of thrombus. Normal compressibility and flow on color Doppler imaging. Profunda Femoral Vein: No evidence of thrombus. Normal compressibility and flow on color Doppler imaging. Femoral Vein: No evidence of thrombus. Normal compressibility, respiratory phasicity and response to augmentation. Popliteal Vein: No evidence of thrombus. Normal compressibility, respiratory phasicity and response to augmentation. Calf Veins: The posterior tibial vein is unremarkable. The peroneal vein was not visualized. Superficial Great Saphenous Vein: No evidence of thrombus. Normal compressibility. Venous Reflux:  Not evaluated. Other Findings:  Left lower extremity edema is noted. IMPRESSION: 1. No DVT identified. 2. There is nonspecific lower extremity edema. Correlation with physical exam is recommended. Electronically Signed   By: Constance Holster M.D.   On: 11/27/2019 17:00    Procedures Procedures (including critical care time)  CRITICAL CARE Performed by: Wandra Arthurs   Total critical care time: 30 minutes  Critical care time was exclusive of separately billable procedures and treating other patients.  Critical care was necessary to treat or prevent imminent or life-threatening deterioration.  Critical care was time spent personally by me on the following activities: development of treatment plan with patient and/or surrogate as well as nursing, discussions with consultants, evaluation of patient's response to treatment, examination of patient, obtaining history from patient or surrogate, ordering and performing treatments and interventions, ordering and review of laboratory studies, ordering and review of radiographic studies, pulse oximetry and re-evaluation of patient's  condition.   Medications Ordered in ED Medications  oxyCODONE (Oxy IR/ROXICODONE) immediate release tablet 10 mg (has no administration in time range)  morphine 2 MG/ML injection 2 mg (has no administration in time range)  ondansetron (ZOFRAN) injection 4 mg (has no administration in time range)  hydrALAZINE (APRESOLINE) tablet 25 mg (has no administration in time range)  cefTRIAXone (ROCEPHIN) 2 g in sodium chloride 0.9 % 100 mL IVPB (has no administration in time range)  insulin aspart (novoLOG) injection 0-9 Units (5 Units Subcutaneous Given 11/27/19 2156)  heparin injection 5,000 Units (5,000 Units Subcutaneous Given 11/27/19 2149)  vancomycin (VANCOREADY) IVPB 1500 mg/300 mL (1,500 mg Intravenous New Bag/Given 11/27/19 2149)  vancomycin (VANCOCIN) IVPB 1000 mg/200 mL premix (has no administration in time range)  furosemide (LASIX) tablet 80 mg (80 mg Oral Not Given 11/27/19 1903)  LORazepam (ATIVAN) tablet 0.5 mg (has no administration in time range)  lactulose (CHRONULAC) 10 GM/15ML solution 30 g (30 g Oral Given 11/27/19 2150)  pantoprazole (PROTONIX) EC tablet 40 mg (has no administration in time range)  ferrous sulfate tablet 325 mg (has no administration in time range)  multivitamin with minerals tablet 1 tablet (1 tablet Oral Not Given 11/27/19 1902)  loratadine (CLARITIN) tablet 10 mg (10 mg Oral Not Given 11/27/19 1903)  insulin aspart protamine- aspart (NOVOLOG MIX 70/30) injection 64 Units (has no administration in time range)  vancomycin (VANCOCIN) IVPB 1000 mg/200 mL premix (0 mg Intravenous Stopped 11/27/19 1900)  ceFEPIme (MAXIPIME) 2 g in sodium chloride 0.9 % 100 mL IVPB ( Intravenous Stopped 11/27/19 1652)    ED Course  I have reviewed the triage vital signs and the nursing notes.  Pertinent labs & imaging results that were available during my care of the patient were reviewed by me and considered in my medical decision making (see chart for details).    MDM  Rules/Calculators/A&P                      DESTRY DAUBER is a 69 y.o. male here  presenting with leg cellulitis.  Patient has extensive cellulitis especially on the left side.  Patient has low-grade temperature as well.  Consider sepsis secondary to cellulitis.  He also may have DVT on the left side as well.  Will do sepsis work-up and give IV antibiotics and will need admission.  3:40 pm WBC 18. Given vanc/cefepime due to renal insufficiency. DVT study ordered. Will admit for sepsis from cellulitis, hospitalist to follow up DVT study   Final Clinical Impression(s) / ED Diagnoses Final diagnoses:  Cellulitis of left lower extremity  Sepsis, due to unspecified organism, unspecified whether acute organ dysfunction present [facility])    Rx / DC Orders ED Discharge Orders    None       Drenda Freeze, MD 11/27/19 2333

## 2019-11-27 NOTE — ED Notes (Signed)
Pt to US.

## 2019-11-28 DIAGNOSIS — L89150 Pressure ulcer of sacral region, unstageable: Secondary | ICD-10-CM | POA: Diagnosis present

## 2019-11-28 LAB — BASIC METABOLIC PANEL
Anion gap: 5 (ref 5–15)
BUN: 45 mg/dL — ABNORMAL HIGH (ref 8–23)
CO2: 22 mmol/L (ref 22–32)
Calcium: 8.2 mg/dL — ABNORMAL LOW (ref 8.9–10.3)
Chloride: 111 mmol/L (ref 98–111)
Creatinine, Ser: 2.41 mg/dL — ABNORMAL HIGH (ref 0.61–1.24)
GFR calc Af Amer: 31 mL/min — ABNORMAL LOW (ref >60)
GFR calc non Af Amer: 27 mL/min — ABNORMAL LOW (ref >60)
Glucose, Bld: 158 mg/dL — ABNORMAL HIGH (ref 70–99)
Potassium: 3.4 mmol/L — ABNORMAL LOW (ref 3.5–5.1)
Sodium: 138 mmol/L (ref 135–145)

## 2019-11-28 LAB — CBC
HCT: 23.2 % — ABNORMAL LOW (ref 39.0–52.0)
Hemoglobin: 7.5 g/dL — ABNORMAL LOW (ref 13.0–17.0)
MCH: 30.6 pg (ref 26.0–34.0)
MCHC: 32.3 g/dL (ref 30.0–36.0)
MCV: 94.7 fL (ref 80.0–100.0)
Platelets: 135 10*3/uL — ABNORMAL LOW (ref 150–400)
RBC: 2.45 MIL/uL — ABNORMAL LOW (ref 4.22–5.81)
RDW: 15.3 % (ref 11.5–15.5)
WBC: 10.3 10*3/uL (ref 4.0–10.5)
nRBC: 0 % (ref 0.0–0.2)

## 2019-11-28 LAB — GLUCOSE, CAPILLARY
Glucose-Capillary: 122 mg/dL — ABNORMAL HIGH (ref 70–99)
Glucose-Capillary: 131 mg/dL — ABNORMAL HIGH (ref 70–99)
Glucose-Capillary: 155 mg/dL — ABNORMAL HIGH (ref 70–99)
Glucose-Capillary: 210 mg/dL — ABNORMAL HIGH (ref 70–99)
Glucose-Capillary: 211 mg/dL — ABNORMAL HIGH (ref 70–99)
Glucose-Capillary: 218 mg/dL — ABNORMAL HIGH (ref 70–99)
Glucose-Capillary: 238 mg/dL — ABNORMAL HIGH (ref 70–99)
Glucose-Capillary: 253 mg/dL — ABNORMAL HIGH (ref 70–99)

## 2019-11-28 LAB — SARS CORONAVIRUS 2 (TAT 6-24 HRS): SARS Coronavirus 2: NEGATIVE

## 2019-11-28 LAB — HIV ANTIBODY (ROUTINE TESTING W REFLEX): HIV Screen 4th Generation wRfx: NONREACTIVE

## 2019-11-28 LAB — C-REACTIVE PROTEIN: CRP: 6.6 mg/dL — ABNORMAL HIGH (ref <1.0)

## 2019-11-28 MED ORDER — INSULIN ASPART 100 UNIT/ML ~~LOC~~ SOLN
0.0000 [IU] | Freq: Every day | SUBCUTANEOUS | Status: DC
  Administered 2019-11-28: 22:00:00 3 [IU] via SUBCUTANEOUS
  Administered 2019-11-29: 2 [IU] via SUBCUTANEOUS
  Filled 2019-11-28 (×2): qty 1

## 2019-11-28 MED ORDER — INSULIN ASPART 100 UNIT/ML ~~LOC~~ SOLN
0.0000 [IU] | Freq: Three times a day (TID) | SUBCUTANEOUS | Status: DC
  Administered 2019-11-28: 2 [IU] via SUBCUTANEOUS
  Administered 2019-11-29: 1 [IU] via SUBCUTANEOUS
  Administered 2019-11-29: 18:00:00 2 [IU] via SUBCUTANEOUS
  Filled 2019-11-28 (×3): qty 1

## 2019-11-28 MED ORDER — INSULIN ASPART PROT & ASPART (70-30 MIX) 100 UNIT/ML ~~LOC~~ SUSP
80.0000 [IU] | Freq: Two times a day (BID) | SUBCUTANEOUS | Status: DC
  Administered 2019-11-28 – 2019-11-29 (×3): 80 [IU] via SUBCUTANEOUS
  Filled 2019-11-28 (×4): qty 10

## 2019-11-28 MED ORDER — POTASSIUM CHLORIDE CRYS ER 20 MEQ PO TBCR
20.0000 meq | EXTENDED_RELEASE_TABLET | Freq: Two times a day (BID) | ORAL | Status: DC
  Administered 2019-11-28 – 2019-11-29 (×4): 20 meq via ORAL
  Filled 2019-11-28 (×4): qty 1

## 2019-11-28 NOTE — Progress Notes (Signed)
[facility] [place] NAME: Corey West    MR#:  [id number]  DATE OF BIRTH:  April 03, 1951  SUBJECTIVE:   Came in with increasing swelling and redness of lower extremity. Patient goes to the wound clinic. Denies fever. Denies shortness of breath other than usual REVIEW OF SYSTEMS:   Review of Systems  Constitutional: Negative for chills, fever and weight loss.  HENT: Negative for ear discharge, ear pain and nosebleeds.   Eyes: Negative for blurred vision, pain and discharge.  Respiratory: Negative for sputum production, shortness of breath, wheezing and stridor.   Cardiovascular: Positive for leg swelling. Negative for chest pain, palpitations, orthopnea and PND.  Gastrointestinal: Negative for abdominal pain, diarrhea, nausea and vomiting.  Genitourinary: Negative for frequency and urgency.  Musculoskeletal: Negative for back pain and joint pain.  Neurological: Positive for weakness. Negative for sensory change, speech change and focal weakness.  Psychiatric/Behavioral: Negative for depression and hallucinations. The patient is not nervous/anxious.    Tolerating Diet:yes Tolerating PT:   DRUG ALLERGIES:  No Known Allergies  VITALS:  Blood pressure (!) 158/58, pulse 75, temperature 98.3 F (36.8 C), temperature source Oral, resp. rate 18, height 6' (1.829 m), weight (!) 151 kg, SpO2 100 %.  PHYSICAL EXAMINATION:   Physical Exam  GENERAL:  69 y.o.-year-old patient lying in the bed with no acute distress. obese EYES: Pupils equal, round, reactive to light and accommodation. No scleral icterus.   HEENT: Head atraumatic, normocephalic. Oropharynx and nasopharynx clear.  NECK:  Supple, no jugular venous distention. No thyroid enlargement, no tenderness.  LUNGS: Normal breath sounds bilaterally, no wheezing, rales, rhonchi. No use of accessory muscles of respiration.  CARDIOVASCULAR: S1, S2 normal. No murmurs, rubs, or gallops.  ABDOMEN:  Soft, nontender, nondistended. Bowel sounds present. No organomegaly or mass.  EXTREMITIES:  Left leg 11/28/2019 NEUROLOGIC: Cranial nerves II through XII are intact. No focal Motor or sensory deficits b/l.   PSYCHIATRIC:  patient is alert and oriented x 3.  Pressure Injury 11/27/19 Buttocks Right Stage 2 -  Partial thickness loss of dermis presenting as a shallow open injury with a red, pink wound bed without slough. moisture associated dermatitis. Top layer of skin excoriated (Active)  11/27/19 1850  Location: Buttocks  Location Orientation: Right  Staging: Stage 2 -  Partial thickness loss of dermis presenting as a shallow open injury with a red, pink wound bed without slough.  Wound Description (Comments): moisture associated dermatitis. Top layer of skin excoriated  Present on Admission: Yes       LABORATORY PANEL:  CBC Recent Labs  Lab 11/28/19 0546  WBC 10.3  HGB 7.5*  HCT 23.2*  PLT 135*    Chemistries  Recent Labs  Lab 11/27/19 1236 11/27/19 1236 11/28/19 0546  NA 135   < > 138  K 3.6   < > 3.4*  CL 108   < > 111  CO2 22   < > 22  GLUCOSE 228*   < > 158*  BUN 45*   < > 45*  CREATININE 2.43*   < > 2.41*  CALCIUM 8.4*   < > 8.2*  AST 31  --   --   ALT 29  --   --   ALKPHOS 84  --   --   BILITOT 0.9  --   --    < > = values in this interval not displayed.   Cardiac Enzymes No results for input(s): TROPONINI  in the last 168 hours. RADIOLOGY:  US Venous Img Lower Unilateral Left (DVT)  Result Date: 11/27/2019 CLINICAL DATA:  Left leg swelling.  Concern for DVT. EXAM: LEFT LOWER EXTREMITY VENOUS DOPPLER ULTRASOUND TECHNIQUE: Gray-scale sonography with graded compression, as well as color Doppler and duplex ultrasound were performed to evaluate the lower extremity deep venous systems from the level of the common femoral vein and including the common femoral, femoral, profunda femoral, popliteal and calf veins including the posterior tibial, peroneal and  gastrocnemius veins when visible. The superficial great saphenous vein was also interrogated. Spectral Doppler was utilized to evaluate flow at rest and with distal augmentation maneuvers in the common femoral, femoral and popliteal veins. COMPARISON:  None. FINDINGS: Contralateral Common Femoral Vein: Respiratory phasicity is normal and symmetric with the symptomatic side. No evidence of thrombus. Normal compressibility. Common Femoral Vein: No evidence of thrombus. Normal compressibility, respiratory phasicity and response to augmentation. Saphenofemoral Junction: No evidence of thrombus. Normal compressibility and flow on color Doppler imaging. Profunda Femoral Vein: No evidence of thrombus. Normal compressibility and flow on color Doppler imaging. Femoral Vein: No evidence of thrombus. Normal compressibility, respiratory phasicity and response to augmentation. Popliteal Vein: No evidence of thrombus. Normal compressibility, respiratory phasicity and response to augmentation. Calf Veins: The posterior tibial vein is unremarkable. The peroneal vein was not visualized. Superficial Great Saphenous Vein: No evidence of thrombus. Normal compressibility. Venous Reflux:  Not evaluated. Other Findings:  Left lower extremity edema is noted. IMPRESSION: 1. No DVT identified. 2. There is nonspecific lower extremity edema. Correlation with physical exam is recommended. Electronically Signed   By: Constance Holster M.D.   On: 11/27/2019 17:00   ASSESSMENT AND PLAN:   Corey West is a 69 y.o. male with medical history significant of hypertension, hyperlipidemia, diabetes mellitus, chronic lower extremity venous insufficiency change, GERD, anxiety, iron deficiency anemia, liver cirrhosis, AVM, dCHF, GI bleeding, CKD-3, who presents with leg pain and swelling, leg wound. Pt states that he has chronic bilateral lower extremity venous insufficiency, and chronic leg swelling and chronic left leg wound. He has been followed  up with wound care center. He states that his leg pain and swelling has been progressively worsening in the last week  Cellulitis of lower extremity: left leg is worse than the left. - Empiric antimicrobial treatment with vancomycin and rocephin (patient received 1 dose of cefepime in ED)--descalate in 1-2 days - PRN Zofran for nausea, and oxycodone for pain - Blood cultures x 2 --so far neg - ESR and CRP - wound care consult on Monday -patient follows at the wound clinic-- He had Unna boot --removed in ER  Cirrhosis [facility]): Mental status normal -continue home lactulose -stable  Gastroesophageal reflux disease -Protonix  Hyperlipidemia -Lipitor  Iron deficiency anemia -Continue iron supplement  CKD (chronic kidney disease), stage IIIa: Stable.  Baseline creatinine 2.0-2.5.  His creatinine is 2.43, BUN 45 -Follow-up renal function with BMP -defer to PCP for out pt nephrology referral  HTN:  -Continue home medications: lasix which is also for CHF -hydralazine prn  Chronic diastolic CHF (congestive heart failure) ([place]):  -2D echo on 10/16/2018 showed EF of 55-60% with grade 2 diastolic dysfunction.  Patient does not have shortness of breath.  Does not seem to have CHF exacerbation.  His bilateral lower leg edema is likely due to cellulitis and chronic venous insufficiency change. -continue home dose lasix  Type II diabetes mellitus with renal manifestations ([place]): Most recent A1c 7.5, poorly controled. Patient is  taking 70/30 insulin at home -SSI  PT to see pt   Family communication :Patient Consults :Wound Discharge Disposition :Home in 1-3 days CODE STATUS: FULL DVT Prophylaxis :Heparin Barriers to discharge: On going IV abx rx, pending wound consult and PT consult  TOTAL TIME TAKING CARE OF THIS PATIENT: *30* minutes.  >50% time spent on counselling and coordination of care  Note: This dictation was prepared with Dragon dictation along with smaller phrase  technology. Any transcriptional errors that result from this process are unintentional.  Fritzi Mandes M.D    [facility]   CC: Primary care physician; Baxter Hire, MDPatient ID: [name], male   DOB: [birth year], 69 y.o.   MRN: [id number]

## 2019-11-28 NOTE — Plan of Care (Signed)
  Problem: Education: Goal: Knowledge of General Education information will improve Description: Including pain rating scale, medication(s)/side effects and non-pharmacologic comfort measures Outcome: Progressing   Problem: Clinical Measurements: Goal: Ability to maintain clinical measurements within normal limits will improve Outcome: Progressing Goal: Will remain free from infection Outcome: Progressing Goal: Diagnostic test results will improve Outcome: Progressing Goal: Respiratory complications will improve Outcome: Progressing Goal: Cardiovascular complication will be avoided Outcome: Progressing   Problem: Activity: Goal: Risk for activity intolerance will decrease Outcome: Progressing   Problem: Coping: Goal: Level of anxiety will decrease Outcome: Progressing   Problem: Pain Managment: Goal: General experience of comfort will improve Outcome: Progressing   Problem: Safety: Goal: Ability to remain free from injury will improve Outcome: Progressing   Problem: Skin Integrity: Goal: Risk for impaired skin integrity will decrease Outcome: Progressing   

## 2019-11-29 DIAGNOSIS — I10 Essential (primary) hypertension: Secondary | ICD-10-CM

## 2019-11-29 LAB — GLUCOSE, CAPILLARY
Glucose-Capillary: 177 mg/dL — ABNORMAL HIGH (ref 70–99)
Glucose-Capillary: 150 mg/dL — ABNORMAL HIGH (ref 70–99)
Glucose-Capillary: 243 mg/dL — ABNORMAL HIGH (ref 70–99)
Glucose-Capillary: 219 mg/dL — ABNORMAL HIGH (ref 70–99)

## 2019-11-29 LAB — CREATININE, SERUM
Creatinine, Ser: 2.49 mg/dL — ABNORMAL HIGH (ref 0.61–1.24)
GFR calc Af Amer: 30 mL/min — ABNORMAL LOW (ref >60)
GFR calc non Af Amer: 26 mL/min — ABNORMAL LOW (ref >60)

## 2019-11-29 NOTE — Plan of Care (Signed)
  Problem: Education: Goal: Knowledge of General Education information will improve Description: Including pain rating scale, medication(s)/side effects and non-pharmacologic comfort measures Outcome: Progressing   Problem: Clinical Measurements: Goal: Ability to maintain clinical measurements within normal limits will improve Outcome: Progressing Goal: Will remain free from infection Outcome: Progressing Goal: Diagnostic test results will improve Outcome: Progressing Goal: Respiratory complications will improve Outcome: Progressing Goal: Cardiovascular complication will be avoided Outcome: Progressing   Problem: Activity: Goal: Risk for activity intolerance will decrease Outcome: Progressing   Problem: Coping: Goal: Level of anxiety will decrease Outcome: Progressing   Problem: Elimination: Goal: Will not experience complications related to bowel motility Outcome: Progressing

## 2019-11-29 NOTE — Evaluation (Signed)
Physical Therapy Evaluation Patient Details Name: Corey West MRN: [id number] DOB: Jun 20, 1951 Today's Date: 11/29/2019   History of Present Illness  Pt admitted for B LE cellulitis with complaints of redness, swelling and pain in B LE. History includes HTN, HLD, DM, GERD, anxiety and GI bleed.  Clinical Impression  Pt is a pleasant 69 year old male who was admitted for B LE cellulitis. Pt performs bed mobility/transfers with mod I and ambulation with supervision and BRW. Ambulation distance self limited due to B LE pain increasing from 3/10 to 5/10. Pt demonstrates deficits with strength/endurance/mobility. Would benefit from skilled PT to address above deficits and promote optimal return to PLOF. Recommend transition to [facility] upon discharge from acute hospitalization.     Follow Up Recommendations Home health PT    Equipment Recommendations  None recommended by PT    Recommendations for Other Services       Precautions / Restrictions Precautions Precautions: Fall Restrictions Weight Bearing Restrictions: No      Mobility  Bed Mobility Overal bed mobility: Modified Independent             General bed mobility comments: uses railing for assistance. Once seated at EOB, upright posture noted  Transfers Overall transfer level: Modified independent Equipment used: Rolling walker (2 wheeled)             General transfer comment: upright posture using RW. Prefers croc sandals vs grip socks  Ambulation/Gait Ambulation/Gait assistance: Supervision Gait Distance (Feet): 80 Feet Assistive device: Rolling walker (2 wheeled) Gait Pattern/deviations: Step-through pattern     General Gait Details: ambulated 2 laps in room, limited by increasing pain to 5/10 with exertion. No LOB noted  Stairs            Wheelchair Mobility    Modified Rankin (Stroke Patients Only)       Balance Overall balance assessment: History of Falls;Needs assistance Sitting-balance  support: Feet supported Sitting balance-Leahy Scale: Normal     Standing balance support: Bilateral upper extremity supported Standing balance-Leahy Scale: Good                               Pertinent Vitals/Pain Pain Assessment: 0-10 Pain Score: 3  Pain Location: B LE Pain Descriptors / Indicators: Aching Pain Intervention(s): Limited activity within patient's tolerance    Home Living Family/patient expects to be discharged to:: Private residence Living Arrangements: Alone   Type of Home: Apartment Home Access: Stairs to enter Entrance Stairs-Rails: Right(uses SPC for hand support on L UE) Entrance Stairs-Number of Steps: 3 Home Layout: One level Home Equipment: Walker - 2 wheels;Cane - single point      Prior Function Level of Independence: Independent with assistive device(s)         Comments: Able to run errands, grocery shop etc in limited context. uses SPC occasionally, uses RW more frequently. Reports 1 recent fall getting tripped up in wires     Hand Dominance        Extremity/Trunk Assessment   Upper Extremity Assessment Upper Extremity Assessment: Overall WFL for tasks assessed    Lower Extremity Assessment Lower Extremity Assessment: Generalized weakness(B LE 4/5; decreased endurance)       Communication   Communication: No difficulties  Cognition Arousal/Alertness: Awake/alert Behavior During Therapy: WFL for tasks assessed/performed Overall Cognitive Status: Within Functional Limits for tasks assessed  General Comments      Exercises Other Exercises Other Exercises: supine ther-ex performed on B LE including heel slides and SLRs. Reports he fatigues with ther-ex and defers further ther-ex. 10 reps performed with mod I   Assessment/Plan    PT Assessment Patient needs continued PT services  PT Problem List Decreased strength;Decreased activity tolerance;Decreased  balance;Decreased mobility;Obesity;Pain       PT Treatment Interventions Gait training;Therapeutic exercise;Balance training    PT Goals (Current goals can be found in the Care Plan section)  Acute Rehab PT Goals Patient Stated Goal: to go home PT Goal Formulation: With patient Time For Goal Achievement: 12/13/19 Potential to Achieve Goals: Good    Frequency Min 2X/week   Barriers to discharge        Co-evaluation               AM-PAC PT "6 Clicks" Mobility  Outcome Measure Help needed turning from your back to your side while in a flat bed without using bedrails?: None Help needed moving from lying on your back to sitting on the side of a flat bed without using bedrails?: None Help needed moving to and from a bed to a chair (including a wheelchair)?: A Little Help needed standing up from a chair using your arms (e.g., wheelchair or bedside chair)?: A Little Help needed to walk in hospital room?: A Little Help needed climbing 3-5 steps with a railing? : A Little 6 Click Score: 20    End of Session Equipment Utilized During Treatment: Gait belt Activity Tolerance: Patient tolerated treatment well Patient left: in bed;with bed alarm set(refusing to sit in recliner) Nurse Communication: Mobility status PT Visit Diagnosis: Muscle weakness (generalized) (M62.81);History of falling (Z91.81);Difficulty in walking, not elsewhere classified (R26.2);Pain Pain - Right/Left: (bilat) Pain - part of body: Leg    Time: 3578-9784 PT Time Calculation (min) (ACUTE ONLY): 24 min   Charges:   PT Evaluation $PT Eval Low Complexity: 1 Low PT Treatments $Therapeutic Exercise: 8-22 mins        Greggory Stallion, PT, DPT [phone]   Kemyah Buser 11/29/2019, 11:58 AM

## 2019-11-29 NOTE — Consult Note (Signed)
Pharmacy Antibiotic Note  Corey West is a 70 y.o. male admitted on 11/27/2019 with cellulitis.  Pharmacy has been consulted for vancomycin dosing.  Plan: Continue vancomycin 1000 mg q24H. Predicted AUC 455. Goal AUC 400-550. Plan for level with 4-5 doses if to continue. Will continue to follow SCr which has remained relatively stable.  Height: 6' (182.9 cm) Weight: (!) 316 lb (143.3 kg) IBW/kg (Calculated) : 77.6  Temp (24hrs), Avg:98.2 F (36.8 C), Min:97.6 F (36.4 C), Max:98.6 F (37 C)  Recent Labs  Lab 11/27/19 1236 11/27/19 1534 11/28/19 0546 11/29/19 0516  WBC 18.7*  --  10.3  --   CREATININE 2.43*  --  2.41* 2.49*  LATICACIDVEN 1.5 1.5  --   --     Estimated Creatinine Clearance: 42.3 mL/min (A) (by C-G formula based on SCr of 2.49 mg/dL (H)).    No Known Allergies  Antimicrobials this admission: 2/26 vancomcyin >>  2/26 cepefeime x 1  2/26 ceftriaxone. >>   Dose adjustments this admission: None  Microbiology results: 2/26 BCx: no growth  Thank you for allowing pharmacy to be a part of this patient's care.  Tawnya Crook, PharmD 11/29/2019 9:27 AM

## 2019-11-29 NOTE — Progress Notes (Signed)
[facility] [place] NAME: Corey West    MR#:  [id number]  DATE OF BIRTH:  December 16, 1950  SUBJECTIVE:   Came in with increasing swelling and redness of lower extremity. Patient goes to the wound clinic regularly Denies fever. Denies shortness of breath other than usual. Redness in Left LE improving REVIEW OF SYSTEMS:   Review of Systems  Constitutional: Negative for chills, fever and weight loss.  HENT: Negative for ear discharge, ear pain and nosebleeds.   Eyes: Negative for blurred vision, pain and discharge.  Respiratory: Negative for sputum production, shortness of breath, wheezing and stridor.   Cardiovascular: Positive for leg swelling. Negative for chest pain, palpitations, orthopnea and PND.  Gastrointestinal: Negative for abdominal pain, diarrhea, nausea and vomiting.  Genitourinary: Negative for frequency and urgency.  Musculoskeletal: Negative for back pain and joint pain.  Neurological: Positive for weakness. Negative for sensory change, speech change and focal weakness.  Psychiatric/Behavioral: Negative for depression and hallucinations. The patient is not nervous/anxious.    Tolerating Diet:yes Tolerating PT: not need--ambulatory  DRUG ALLERGIES:  No Known Allergies  VITALS:  Blood pressure (!) 148/55, pulse 72, temperature 98.6 F (37 C), temperature source Oral, resp. rate 18, height 6' (1.829 m), weight (!) 143.3 kg, SpO2 100 %.  PHYSICAL EXAMINATION:   Physical Exam  GENERAL:  69 y.o.-year-old patient lying in the bed with no acute distress. obese EYES: Pupils equal, round, reactive to light and accommodation. No scleral icterus.   HEENT: Head atraumatic, normocephalic. Oropharynx and nasopharynx clear.  NECK:  Supple, no jugular venous distention. No thyroid enlargement, no tenderness.  LUNGS: Normal breath sounds bilaterally, no wheezing, rales, rhonchi. No use of accessory muscles of respiration.  CARDIOVASCULAR:  S1, S2 normal. No murmurs, rubs, or gallops.  ABDOMEN: Soft, nontender, nondistended. Bowel sounds present. No organomegaly or mass.  EXTREMITIES Left leg 11/28/2019    Left leg 11/29/2019    NEUROLOGIC: Cranial nerves II through XII are intact. No focal Motor or sensory deficits b/l.   PSYCHIATRIC:  patient is alert and oriented x 3.   Pressure Injury 11/27/19 Buttocks Right Stage 2 -  Partial thickness loss of dermis presenting as a shallow open injury with a red, pink wound bed without slough. moisture associated dermatitis. Top layer of skin excoriated (Active)  11/27/19 1850  Location: Buttocks  Location Orientation: Right  Staging: Stage 2 -  Partial thickness loss of dermis presenting as a shallow open injury with a red, pink wound bed without slough.  Wound Description (Comments): moisture associated dermatitis. Top layer of skin excoriated  Present on Admission: Yes   LABORATORY PANEL:  CBC Recent Labs  Lab 11/28/19 0546  WBC 10.3  HGB 7.5*  HCT 23.2*  PLT 135*    Chemistries  Recent Labs  Lab 11/27/19 1236 11/27/19 1236 11/28/19 0546 11/28/19 0546 11/29/19 0516  NA 135   < > 138  --   --   K 3.6   < > 3.4*  --   --   CL 108   < > 111  --   --   CO2 22   < > 22  --   --   GLUCOSE 228*   < > 158*  --   --   BUN 45*   < > 45*  --   --   CREATININE 2.43*   < > 2.41*   < > 2.49*  CALCIUM 8.4*   < >  8.2*  --   --   AST 31  --   --   --   --   ALT 29  --   --   --   --   ALKPHOS 84  --   --   --   --   BILITOT 0.9  --   --   --   --    < > = values in this interval not displayed.   Cardiac Enzymes No results for input(s): TROPONINI in the last 168 hours. RADIOLOGY:  US Venous Img Lower Unilateral Left (DVT)  Result Date: 11/27/2019 CLINICAL DATA:  Left leg swelling.  Concern for DVT. EXAM: LEFT LOWER EXTREMITY VENOUS DOPPLER ULTRASOUND TECHNIQUE: Gray-scale sonography with graded compression, as well as color Doppler and duplex ultrasound were performed to  evaluate the lower extremity deep venous systems from the level of the common femoral vein and including the common femoral, femoral, profunda femoral, popliteal and calf veins including the posterior tibial, peroneal and gastrocnemius veins when visible. The superficial great saphenous vein was also interrogated. Spectral Doppler was utilized to evaluate flow at rest and with distal augmentation maneuvers in the common femoral, femoral and popliteal veins. COMPARISON:  None. FINDINGS: Contralateral Common Femoral Vein: Respiratory phasicity is normal and symmetric with the symptomatic side. No evidence of thrombus. Normal compressibility. Common Femoral Vein: No evidence of thrombus. Normal compressibility, respiratory phasicity and response to augmentation. Saphenofemoral Junction: No evidence of thrombus. Normal compressibility and flow on color Doppler imaging. Profunda Femoral Vein: No evidence of thrombus. Normal compressibility and flow on color Doppler imaging. Femoral Vein: No evidence of thrombus. Normal compressibility, respiratory phasicity and response to augmentation. Popliteal Vein: No evidence of thrombus. Normal compressibility, respiratory phasicity and response to augmentation. Calf Veins: The posterior tibial vein is unremarkable. The peroneal vein was not visualized. Superficial Great Saphenous Vein: No evidence of thrombus. Normal compressibility. Venous Reflux:  Not evaluated. Other Findings:  Left lower extremity edema is noted. IMPRESSION: 1. No DVT identified. 2. There is nonspecific lower extremity edema. Correlation with physical exam is recommended. Electronically Signed   By: Constance Holster M.D.   On: 11/27/2019 17:00   ASSESSMENT AND PLAN:   Corey West is a 69 y.o. male with medical history significant of hypertension, hyperlipidemia, diabetes mellitus, chronic lower extremity venous insufficiency change, GERD, anxiety, iron deficiency anemia, liver cirrhosis, AVM, dCHF, GI  bleeding, CKD-3, who presents with leg pain and swelling, leg wound. Pt states that he has chronic bilateral lower extremity venous insufficiency, and chronic leg swelling and chronic left leg wound. He has been followed up with wound care center. He states that his leg pain and swelling has been progressively worsening in the last week  Cellulitis of lower extremity: left leg is worse than the left. - Empiric antimicrobial treatment with vancomycin and rocephin (patient received 1 dose of cefepime in ED)--descalate to IV Rocephin -redness much improved - PRN Zofran for nausea, and oxycodone for pain - Blood cultures x 2 --so far neg - wound care consult on Monday -patient follows at the wound clinic-- He had Unna boot --removed in ER  Cirrhosis [facility]): Mental status normal -continue home lactulose -stable  Gastroesophageal reflux disease -Protonix  Hyperlipidemia -Lipitor  Iron deficiency anemia -Continue iron supplement  CKD (chronic kidney disease), stage IIIa: Stable.  Baseline creatinine 2.0-2.5.  His creatinine is 2.43, BUN 45 -Follow-up renal function with BMP -defer to PCP for out pt nephrology referral  HTN:  -  Continue home medications: lasix which is also for CHF -hydralazine prn  Chronic diastolic CHF (congestive heart failure) ([place]):  -2D echo on 10/16/2018 showed EF of 55-60% with grade 2 diastolic dysfunction.  Patient does not have shortness of breath.  Does not seem to have CHF exacerbation.  His bilateral lower leg edema is likely due to cellulitis and chronic venous insufficiency change. -continue home dose lasix  Type II diabetes mellitus with renal manifestations ([place]): Most recent A1c 7.5, poorly controled. Patient is taking 70/30 insulin at home -SSI    Family communication :Patient Consults :Wound Discharge Disposition :Home tomorrow CODE STATUS: FULL DVT Prophylaxis :Heparin Barriers to discharge: On going IV abx rx, pending wound consult   TOTAL TIME TAKING CARE OF THIS PATIENT: *30* minutes.  >50% time spent on counselling and coordination of care  Note: This dictation was prepared with Dragon dictation along with smaller phrase technology. Any transcriptional errors that result from this process are unintentional.  Fritzi Mandes M.D    [facility]   CC: Primary care physician; Baxter Hire, MDPatient ID: Corey West, male   DOB: Apr 27, 1951, 69 y.o.   MRN: [id number]

## 2019-11-30 LAB — CREATININE, SERUM
Creatinine, Ser: 2.37 mg/dL — ABNORMAL HIGH (ref 0.61–1.24)
GFR calc Af Amer: 32 mL/min — ABNORMAL LOW (ref >60)
GFR calc non Af Amer: 27 mL/min — ABNORMAL LOW (ref >60)

## 2019-11-30 MED ORDER — OXYCODONE HCL 5 MG PO TABS: 5.0000 mg | ORAL_TABLET | Freq: Four times a day (QID) | ORAL | Status: DC | PRN

## 2019-11-30 MED ORDER — CEPHALEXIN 500 MG PO CAPS: 500.0000 mg | ORAL_CAPSULE | Freq: Four times a day (QID) | ORAL | Status: DC

## 2019-11-30 MED ORDER — CEPHALEXIN 500 MG PO CAPS: 500.0000 mg | ORAL_CAPSULE | Freq: Four times a day (QID) | ORAL | 0 refills | Status: AC

## 2019-11-30 NOTE — Care Management Important Message (Signed)
Important Message  Patient Details  Name: Corey West MRN: [id number] Date of Birth: [birth year]   Medicare Important Message Given:  Yes     Juliann Pulse A Zoe Creasman 11/30/2019, 10:20 AM

## 2019-11-30 NOTE — Consult Note (Addendum)
[place] Nurse Consult Note: Reason for Consult: Pt was relieved that wound care could see him early this am, since he plans to leave.  Wound type: Bilt legs with generalized edema and erythemia; previously noted cellulitis in photo is slowly receeding from line marked on leg.  Dry scabbed skin to both calves and small amt yellow drainage, scattered areas of patchy partial thickness skil loss to posterior leg, red and moist.  Dressing procedure/placement/frequency: Pt states he was wearing Una boots prior to admission and they are changed 3 times a week.  NIKE and coban. Leave  bilat Una boots in place; pt will have home health change 3 times a week after discharge.  Pt states he will follow-up with the outpatient wound care center after discharge.  Please re-consult if further assistance is needed.  Thank-you,  Julien Girt MSN, [place], [place], [place], [place]

## 2019-11-30 NOTE — TOC Transition Note (Signed)
Transition of Care [facility]) - CM/SW Discharge Note   Patient Details  Name: Corey West MRN: [id number] Date of Birth: [birth year]  Transition of Care [facility] CM/SW Contact:  Elease Hashimoto, LCSW Phone Number: 11/30/2019, 9:05 AM   Clinical Narrative:   Met with pt to discuss discharge needs. He already has [facility] coming out for RN needs. He is agreeable to adding PT to this. He feels he is doing well and ready to go home. He lives alone [name] his niece coming by and checking on him. He feels he needs to be home and out of this hospital. He doesn't like being in a hospital. He has a PCP and goes to the wound clinic weekly. He has all needed equipment and feels no other needs. Have let [name] know of DC today and to add HHPT. Pt ready for discharge, bedside RN aware of this. No further follow    Final next level of care: Home w Home Health Services Barriers to Discharge: Barriers Resolved   Patient Goals and CMS Choice Patient states their goals for this hospitalization and ongoing recovery are:: I am ready to get out of here and go home      Discharge Placement                       Discharge Plan and Services In-house Referral: Clinical Social Work   Post Acute Care Choice: Home Health                    HH Arranged: RN, PT [facility] Agency: [place] ([facility]) Date HH Agency Contacted: 11/30/19 Time [place]: [phone] Representative spoke with at [place]: [place] ([place]) Interventions     Readmission Risk Interventions Readmission Risk Prevention Plan 02/03/2019 01/15/2019  Transportation Screening Complete Complete  HRI or [place] - Complete  Medication Review Press photographer) Complete Complete  HRI or Home Care Consult Complete -  SW Recovery Care/Counseling Consult Not Complete -  SW Consult Not Complete Comments NA -  Palliative Care Screening Not Applicable -  [place] Not Applicable -   Some recent data might be hidden

## 2019-11-30 NOTE — Progress Notes (Signed)
Discharge instructions reviewed with patient. He verbalized understanding. IVs removed. Woundcare came and wrapped both legs. He refused meds, vitals, blood sugar checks and assessment this morning. He is now waiting on NT to wheel him out.

## 2019-11-30 NOTE — Progress Notes (Signed)
During shift change patient very angry and states that we lie and tell things that are untrue because he is mad that woundcare and MD were not in the room to see him by 7:00 AM. States that he would like to leave AMA.

## 2019-11-30 NOTE — Discharge Summary (Signed)
[facility] NAME: Corey West    MR#:  [id number]  DATE OF BIRTH:  1950-11-24  DATE OF ADMISSION:  11/27/2019 ADMITTING PHYSICIAN: Ivor Costa, MD  DATE OF DISCHARGE: 11/30/2019  PRIMARY CARE PHYSICIAN: Baxter Hire, MD    ADMISSION DIAGNOSIS:  Cellulitis of left lower extremity [L03.116] Cellulitis of lower extremity [L03.119] Sepsis, due to unspecified organism, unspecified whether acute organ dysfunction present ([place]) [A41.9]  DISCHARGE DIAGNOSIS:  Cellulitis Left LE Bilateral venous stasis/chronic leg edema  SECONDARY DIAGNOSIS:   Past Medical History:  Diagnosis Date  . Anemia   . Anxiety    controlled;   . Arthritis   . AVM (arteriovenous malformation) of stomach, acquired with hemorrhage   . Barrett's esophagus   . Chronic kidney disease    renal infufficiency  . Cirrhosis ([place])   . Depression    controlled;   Marland Kitchen Diabetes mellitus without complication ([place])    not controlled, taking insulin but sugar continues to run high;   . Edema   . Esophageal varices ([place])   . GAVE (gastric antral vascular ectasia)   . GERD (gastroesophageal reflux disease)   . History of hiatal hernia   . Hyperlipidemia   . Hypertension    controlled well;   . Liver cirrhosis ([place])   . Nephropathy, diabetic ([place])   . Obesity   . Pancytopenia ([place])   . Polyp, stomach    with chronic blood loss  . Sleep apnea    does not wear a cpap, Medicare would not pay for it   . Venous stasis dermatitis of both lower extremities   . Venous stasis of both lower extremities    with cellulitis    HOSPITAL COURSE:   Corey Mako Jamesis a 69 y.o.malewith medical history significant ofhypertension, hyperlipidemia, diabetes mellitus, chronic lower extremity venous insufficiency change, GERD, anxiety, iron deficiency anemia, liver cirrhosis, AVM,dCHF, GI bleeding, CKD-3, who presents with leg pain and swelling, leg wound.  Cellulitis of lower  extremity: left leg is worse than the left. - Empiric antimicrobial treatment with vancomycinand rocephin(patient received 1 dose of cefepime in ED)--descalate to IV Rocephin--now on po keflex -redness much improved - PRN Zofran for nausea,and oxycodonefor pain - Blood cultures x 2 --so far neg - wound care consult done--Unna boot applied -patient follows at the wound clinic  Cirrhosis (HCC):Mental status normal -continue homelactulose -stable  Gastroesophageal reflux disease -Protonix  Hyperlipidemia -Lipitor  Iron deficiency anemia -Continue iron supplement  CKD (chronic kidney disease), stage IIIa:Stable. Baseline creatinine 2.0-2.5. His creatinine is 2.43, BUN 45 -Follow-up renal function with BMP -defer to PCP for out pt nephrology referral  HTN:  -Continue home medications:lasix which is also for CHF -hydralazine prn  Chronic diastolic CHF (congestive heart failure) ([place]): -2D echo on 10/16/2018 showed EF of 55-60% with grade 2 diastolic dysfunction. Patient does not have shortness of breath. Does not seem to have CHF exacerbation. His bilateral lower leg edema is likely due to cellulitis and chronic venous insufficiency change. -continue home dose lasix  Type II diabetes mellitus with renal manifestations ([place]):Most recent A1c7.5, poorly controled. Patient is taking70/30 insulinat home--resumed -SSI    Family communication :Patient Consults :Wound Discharge Disposition :Home today CODE STATUS: FULL DVT Prophylaxis :Heparin Barriers to discharge:none TOTAL TIME TAKING CARE OF THIS PATIENT: *30* minutes CONSULTS OBTAINED:    DRUG ALLERGIES:  No Known Allergies  DISCHARGE MEDICATIONS:   Allergies as of 11/30/2019   No Known  Allergies     Medication List    STOP taking these medications   metolazone 5 MG tablet Commonly known as: ZAROXOLYN     TAKE these medications   cephALEXin 500 MG capsule Commonly known as: KEFLEX Take  1 capsule (500 mg total) by mouth every 6 (six) hours for 22 doses.   cetirizine 10 MG tablet Commonly known as: ZYRTEC Take 10 mg by mouth daily.   ferrous sulfate 325 (65 FE) MG tablet Take 325 mg by mouth 2 (two) times daily with a meal.   furosemide 40 MG tablet Commonly known as: LASIX Take 80 mg by mouth 2 (two) times a day.   insulin aspart protamine - aspart (70-30) 100 UNIT/ML FlexPen Commonly known as: NOVOLOG 70/30 MIX Inject 84 Units into the skin 2 (two) times daily.   lactulose 10 GM/15ML solution Commonly known as: CHRONULAC Take 30 g by mouth 4 (four) times daily.   LORazepam 0.5 MG tablet Commonly known as: ATIVAN Take 1 tablet (0.5 mg total) by mouth every 8 (eight) hours as needed for anxiety.   multivitamin tablet Take 1 tablet by mouth daily.   pantoprazole 40 MG tablet Commonly known as: Protonix Take 1 tablet (40 mg total) by mouth 2 (two) times daily.   Potassium Chloride ER 20 MEQ Tbcr Take 20 mEq by mouth 2 (two) times daily. While on lasix' What changed: additional instructions   traMADol 50 MG tablet Commonly known as: ULTRAM Take 50 mg by mouth every 6 (six) hours as needed for moderate pain.       If you experience worsening of your admission symptoms, develop shortness of breath, life threatening emergency, suicidal or homicidal thoughts you must seek medical attention immediately by calling 911 or calling your MD immediately  if symptoms less severe.  You Must read complete instructions/literature along with all the possible adverse reactions/side effects for all the Medicines you take and that have been prescribed to you. Take any new Medicines after you have completely understood and accept all the possible adverse reactions/side effects.   Please note  You were cared for by a hospitalist during your hospital stay. If you have any questions about your discharge medications or the care you received while you were in the hospital after  you are discharged, you can call the unit and asked to speak with the hospitalist on call if the hospitalist that took care of you is not available. Once you are discharged, your primary care physician will handle any further medical issues. Please note that NO REFILLS for any discharge medications will be authorized once you are discharged, as it is imperative that you return to your primary care physician (or establish a relationship with a primary care physician if you do not have one) for your aftercare needs so that they can reassess your need for medications and monitor your lab values. Today   SUBJECTIVE   Anxious to go home  VITAL SIGNS:  Blood pressure (!) 143/46, pulse 71, temperature 98.3 F (36.8 C), temperature source Oral, resp. rate 18, height 6' (1.829 m), weight (!) 143.3 kg, SpO2 99 %.  I/O:    Intake/Output Summary (Last 24 hours) at 11/30/2019 0851 Last data filed at 11/29/2019 1854 Gross per 24 hour  Intake 700 ml  Output 1000 ml  Net -300 ml    PHYSICAL EXAMINATION:  GENERAL:  69 y.o.-year-old patient lying in the bed with no acute distress. obese EYES: Pupils equal, round, reactive to light and  accommodation. No scleral icterus.   HEENT: Head atraumatic, normocephalic. Oropharynx and nasopharynx clear.  NECK:  Supple, no jugular venous distention. No thyroid enlargement, no tenderness.  LUNGS: Normal breath sounds bilaterally, no wheezing, rales, rhonchi. No use of accessory muscles of respiration.  CARDIOVASCULAR: S1, S2 normal. No murmurs, rubs, or gallops.  ABDOMEN: Soft, nontender, nondistended. Bowel sounds present. No organomegaly or mass.  EXTREMITIES Left leg 11/28/2019    Left leg 11/29/2019    NEUROLOGIC: Cranial nerves II through XII are intact. No focal Motor or sensory deficits b/l.   PSYCHIATRIC:  patient is alert and oriented x 3.   Pressure Injury 11/27/19 Buttocks Right Stage 2 -  Partial thickness loss of dermis presenting as a  shallow open injury with a red, pink wound bed without slough. moisture associated dermatitis. Top layer of skin excoriated (Active)  11/27/19 1850  Location: Buttocks  Location Orientation: Right  Staging: Stage 2 -  Partial thickness loss of dermis presenting as a shallow open injury with a red, pink wound bed without slough.  Wound Description (Comments): moisture associated dermatitis. Top layer of skin excoriated  Present on Admission: Yes    DATA REVIEW:   CBC  Recent Labs  Lab 11/28/19 0546  WBC 10.3  HGB 7.5*  HCT 23.2*  PLT 135*    Chemistries  Recent Labs  Lab 11/27/19 1236 11/27/19 1236 11/28/19 0546 11/29/19 0516 11/30/19 0355  NA 135   < > 138  --   --   K 3.6   < > 3.4*  --   --   CL 108   < > 111  --   --   CO2 22   < > 22  --   --   GLUCOSE 228*   < > 158*  --   --   BUN 45*   < > 45*  --   --   CREATININE 2.43*   < > 2.41*   < > 2.37*  CALCIUM 8.4*   < > 8.2*  --   --   AST 31  --   --   --   --   ALT 29  --   --   --   --   ALKPHOS 84  --   --   --   --   BILITOT 0.9  --   --   --   --    < > = values in this interval not displayed.    Microbiology Results   Recent Results (from the past 240 hour(s))  SARS CORONAVIRUS 2 (TAT 6-24 HRS) Nasopharyngeal Nasopharyngeal Swab     Status: None   Collection Time: 11/27/19  3:34 PM   Specimen: Nasopharyngeal Swab  Result Value Ref Range Status   SARS Coronavirus 2 NEGATIVE NEGATIVE Final    Comment: (NOTE) SARS-CoV-2 target nucleic acids are NOT DETECTED. The SARS-CoV-2 RNA is generally detectable in upper and lower respiratory specimens during the acute phase of infection. Negative results do not preclude SARS-CoV-2 infection, do not rule out co-infections with other pathogens, and should not be used as the sole basis for treatment or other patient management decisions. Negative results must be combined with clinical observations, patient history, and epidemiological information. The  expected result is Negative. Fact Sheet for Patients: SugarRoll.be Fact Sheet for Healthcare Providers: https://www.woods-mathews.com/ This test is not yet approved or cleared by the [place] FDA and  has been authorized for detection and/or diagnosis of SARS-CoV-2 by FDA  under an Emergency Use Authorization (EUA). This EUA will remain  in effect (meaning this test can be used) for the duration of the COVID-19 declaration under Section 56 4(b)(1) of the Act, 21 U.S.C. section 360bbb-3(b)(1), unless the authorization is terminated or revoked sooner. Performed at [facility], [place] [address] [place], [place] [address]   Blood culture (routine x 2)     Status: None (Preliminary result)   Collection Time: 11/27/19  3:34 PM   Specimen: BLOOD  Result Value Ref Range Status   Specimen Description BLOOD R FA  Final   Special Requests   Final    BOTTLES DRAWN AEROBIC AND ANAEROBIC Blood Culture adequate volume   Culture   Final    NO GROWTH 3 DAYS Performed at [facility], [address], [place], [place] [address]    Report Status PENDING  Incomplete  Blood culture (routine x 2)     Status: None (Preliminary result)   Collection Time: 11/27/19  3:34 PM   Specimen: BLOOD  Result Value Ref Range Status   Specimen Description BLOOD BLOOD LEFT FOREARM  Final   Special Requests   Final    BOTTLES DRAWN AEROBIC AND ANAEROBIC Blood Culture adequate volume   Culture   Final    NO GROWTH 3 DAYS Performed at [facility], [address] [place], [place] [address]    Report Status PENDING  Incomplete    RADIOLOGY:  No results found.   CODE STATUS:     Code Status Orders  (From admission, onward)         Start     Ordered   11/27/19 1619  Full code  Continuous     11/27/19 1618        Code Status History    Date Active Date Inactive Code Status Order ID Comments User Context   04/28/2019 2254 04/29/2019  1722 Full Code [id number]  Loletha Grayer, MD ED   03/05/2019 1753 03/06/2019 2020 Full Code [id number]  Dustin Flock, MD Inpatient   02/03/2019 0541 02/05/2019 1436 Full Code [id number]  Harrie Foreman, MD Inpatient   01/15/2019 0047 01/15/2019 1657 Full Code [id number]  Lance Coon, MD Inpatient   12/02/2018 1713 12/09/2018 1737 Full Code [id number]  Hillary Bow, MD ED   10/16/2018 0054 10/18/2018 1842 Full Code [id number]  [name], MD Inpatient   07/18/2018 2339 07/21/2018 1356 Full Code [id number]  Lance Coon, MD Inpatient   06/18/2018 1609 06/19/2018 1717 Full Code [id number]  Epifanio Lesches, MD ED   12/31/2017 1856 01/01/2018 1205 Full Code [id number]  Saundra Shelling, MD Inpatient   12/25/2017 1456 12/26/2017 1618 Full Code [id number]  Epifanio Lesches, MD ED   12/16/2017 0921 12/18/2017 1635 DNR [id number]  Max Sane, MD Inpatient   12/16/2017 0305 12/16/2017 0921 Full Code [id number]  Salary, Avel Peace, MD ED   12/02/2017 2003 12/05/2017 2000 Full Code [id number]  Saundra Shelling, MD Inpatient   11/21/2017 1948 11/23/2017 1958 Full Code [id number]  Loletha Grayer, MD ED   10/23/2017 1405 10/26/2017 1402 Full Code [id number]  Bettey Costa, MD Inpatient   10/18/2017 0058 10/19/2017 1806 Full Code [id number]  Saundra Shelling, MD Inpatient   10/09/2017 2139 10/11/2017 1806 Full Code [id number]  [name], MD Inpatient   09/03/2017 2124 09/05/2017 1552 Full Code [id number]  Nicholes Mango, MD Inpatient   06/26/2017 1119 06/28/2017 1748 Full Code [id number]  Henreitta Leber, MD Inpatient   02/21/2017 2007 02/22/2017 1803 Full Code [id number]  [name], MD Inpatient   07/20/2015 1245 07/22/2015 2217 Full Code [id number]  Nicholes Mango, MD Inpatient   02/08/2015 2331 02/11/2015 1943 Full Code [id number]  Hower, Aaron Mose, MD ED   Advance Care Planning Activity       TOTAL TIME TAKING CARE OF THIS PATIENT: **40* minutes.    Fritzi Mandes M.D  [facility]    CC: Primary care  physician; Baxter Hire, MD

## 2019-11-30 NOTE — Discharge Instructions (Signed)
Resume your wound clinic appts as before

## 2019-12-01 ENCOUNTER — Ambulatory Visit: Payer: Medicare Other | Admitting: Physician Assistant

## 2019-12-02 LAB — CULTURE, BLOOD (ROUTINE X 2)
Culture: NO GROWTH
Culture: NO GROWTH
Special Requests: ADEQUATE
Special Requests: ADEQUATE

## 2019-12-04 NOTE — Progress Notes (Signed)
HERU, MONTZ ([id number]) Visit Report for 11/23/2019 Allergy List Details Patient Name: Corey West, Corey West. Date of Service: 11/23/2019 1:15 PM Medical Record Number: [id number] Patient Account Number: [id number] Date of Birth/Sex: 12/26/1950 (69 y.o. M) Treating RN: Montey Hora Primary Care Haylen Shelnutt: [name] Other Clinician: Referring Jadee Golebiewski: Referral, Self Treating Shooter Tangen/Extender: STONE III, HOYT Weeks in Treatment: 0 Allergies Active Allergies No Known Drug Allergies Allergy Notes Electronic Signature(s) Signed: 11/23/2019 4:53:18 PM By: Montey Hora Entered By: Montey Hora on 11/23/2019 13:19:15 [name] ([id number]) -------------------------------------------------------------------------------- Arrival Information Details Patient Name: [name], [name] Date of Service: 11/23/2019 1:15 PM Medical Record Number: [id number] Patient Account Number: [id number] Date of Birth/Sex: Dec 30, 1950 (69 y.o. M) Treating RN: Cornell Barman Primary Care Kaelum Kissick: [name] Other Clinician: Referring Benn Tarver: Referral, Self Treating Omah Dewalt/Extender: Melburn Hake, HOYT Weeks in Treatment: 0 Visit Information Patient Arrived: Walker Arrival Time: 13:11 Accompanied By: self Transfer Assistance: None Patient Identification Verified: Yes Secondary Verification Process Completed: Yes History Since Last Visit Added or deleted any medications: No Any new allergies or adverse reactions: No Had a fall or experienced change in activities of daily living that may affect risk of falls: No Signs or symptoms of abuse/neglect since last visito No Hospitalized since last visit: No Electronic Signature(s) Signed: 11/23/2019 4:34:46 PM By: Lorine Bears RCP, RRT, CHT Entered By: Lorine Bears on 11/23/2019 13:12:59 [name] ([id number]) -------------------------------------------------------------------------------- Clinic Level of Care Assessment  Details Patient Name: [name], [name] Date of Service: 11/23/2019 1:15 PM Medical Record Number: [id number] Patient Account Number: [id number] Date of Birth/Sex: [birth year] (69 y.o. M) Treating RN: Army Melia Primary Care Myiesha Edgar: [name] Other Clinician: Referring Kare Dado: Referral, Self Treating Lacresha Fusilier/Extender: Melburn Hake, HOYT Weeks in Treatment: 0 Clinic Level of Care Assessment Items TOOL 1 Quantity Score []  - Use when EandM and Procedure is performed on INITIAL visit 0 ASSESSMENTS - Nursing Assessment / Reassessment X - General Physical Exam (combine w/ comprehensive assessment (listed just below) when performed on new pt. 1 20 evals) X- 1 25 Comprehensive Assessment (HX, ROS, Risk Assessments, Wounds Hx, etc.) ASSESSMENTS - Wound and Skin Assessment / Reassessment []  - Dermatologic / Skin Assessment (not related to wound area) 0 ASSESSMENTS - Ostomy and/or Continence Assessment and Care []  - Incontinence Assessment and Management 0 []  - 0 Ostomy Care Assessment and Management (repouching, etc.) PROCESS - Coordination of Care X - Simple Patient / Family Education for ongoing care 1 15 []  - 0 Complex (extensive) Patient / Family Education for ongoing care X- 1 10 Staff obtains Programmer, systems, Records, Test Results / Process Orders []  - 0 Staff telephones HHA, Nursing Homes / Clarify orders / etc []  - 0 Routine Transfer to another Facility (non-emergent condition) []  - 0 Routine Hospital Admission (non-emergent condition) X- 1 15 New Admissions / Biomedical engineer / Ordering NPWT, Apligraf, etc. []  - 0 Emergency Hospital Admission (emergent condition) PROCESS - Special Needs []  - Pediatric / Minor Patient Management 0 []  - 0 Isolation Patient Management []  - 0 Hearing / Language / Visual special needs []  - 0 Assessment of Community assistance (transportation, D/C planning, etc.) []  - 0 Additional assistance / Altered mentation []  - 0 Support Surface(s)  Assessment (bed, cushion, seat, etc.) INTERVENTIONS - Miscellaneous []  - External ear exam 0 []  - 0 Patient Transfer (multiple staff / Civil Service fast streamer / Similar devices) []  - 0 Simple Staple / Suture removal (25 or less) []  - 0 Complex Staple / Suture removal (26 or more) []  -  0 Hypo/Hyperglycemic Management (do not check if billed separately) DELVECCHIO, MADOLE ([id number]) []  - 0 Ankle / Brachial Index (ABI) - do not check if billed separately Has the patient been seen at the hospital within the last three years: Yes Total Score: 85 Level Of Care: New/Established - Level 3 Electronic Signature(s) Signed: 11/23/2019 4:40:14 PM By: Army Melia Entered By: Army Melia on 11/23/2019 14:13:32 [name] ([id number]) -------------------------------------------------------------------------------- Encounter Discharge Information Details Patient Name: [name], [name] Date of Service: 11/23/2019 1:15 PM Medical Record Number: [id number] Patient Account Number: [id number] Date of Birth/Sex: 1951/08/14 (69 y.o. M) Treating RN: Army Melia Primary Care Nussen Pullin: [name] Other Clinician: Referring Tiandra Swoveland: Referral, Self Treating Nickalous Stingley/Extender: Melburn Hake, HOYT Weeks in Treatment: 0 Encounter Discharge Information Items Post Procedure Vitals Discharge Condition: Stable Temperature (F): 97.9 Ambulatory Status: Walker Pulse (bpm): 79 Discharge Destination: Home Respiratory Rate (breaths/min): 16 Transportation: Private Auto Blood Pressure (mmHg): 179/65 Accompanied By: self Schedule Follow-up Appointment: Yes Clinical Summary of Care: Electronic Signature(s) Signed: 11/23/2019 4:40:14 PM By: Army Melia Entered By: Army Melia on 11/23/2019 14:14:35 [name] ([id number]) -------------------------------------------------------------------------------- Lower Extremity Assessment Details Patient Name: Corey Cower A. Date of Service: 11/23/2019 1:15 PM Medical Record Number:  [id number] Patient Account Number: [id number] Date of Birth/Sex: 1951-04-22 (69 y.o. M) Treating RN: Montey Hora Primary Care Aliha Diedrich: [name] Other Clinician: Referring Kiefer Opheim: Referral, Self Treating Haruye Lainez/Extender: Melburn Hake, HOYT Weeks in Treatment: 0 Edema Assessment Assessed: [Left: No] [Right: No] Edema: [Left: Yes] [Right: Yes] Calf Left: Right: Point of Measurement: 32 cm From Medial Instep 41.5 cm 44 cm Ankle Left: Right: Point of Measurement: 12 cm From Medial Instep 27.3 cm 29.5 cm Vascular Assessment Pulses: Dorsalis Pedis Palpable: [Left:Yes] [Right:Yes] Doppler Audible: [Left:Yes] [Right:Yes] Posterior Tibial Palpable: [Left:No Yes] [Right:No Yes] Notes ABI on Right >220; Left patient could not tolerate r/t pain Electronic Signature(s) Signed: 11/23/2019 1:58:13 PM By: Montey Hora Previous Signature: 11/23/2019 1:52:39 PM Version By: Montey Hora Entered By: Montey Hora on 11/23/2019 13:58:12 [name] ([id number]) -------------------------------------------------------------------------------- Multi Wound Chart Details Patient Name: Corey Cower A. Date of Service: 11/23/2019 1:15 PM Medical Record Number: [id number] Patient Account Number: [id number] Date of Birth/Sex: 16-May-1951 (69 y.o. M) Treating RN: Army Melia Primary Care Jabar Krysiak: [name] Other Clinician: Referring Yassir Enis: Referral, Self Treating Yogi Arther/Extender: Melburn Hake, HOYT Weeks in Treatment: 0 Vital Signs Height(in): 72 Pulse(bpm): 51 Weight(lbs): 761 Blood Pressure(mmHg): 179/65 Body Mass Index(BMI): 43 Temperature(F): 97.9 Respiratory Rate(breaths/min): 18 Photos: Wound Location: Left Lower Leg - Circumferential Right Lower Leg - Posterior Right Lower Leg - Anterior Wounding Event: Gradually Appeared Gradually Appeared Gradually Appeared Primary Etiology: Lymphedema Lymphedema Lymphedema Comorbid History: Cataracts, Anemia, Sleep Apnea, Cataracts,  Anemia, Sleep Apnea, Cataracts, Anemia, Sleep Apnea, Hypertension, Cirrhosis , Type II Hypertension, Cirrhosis , Type II Hypertension, Cirrhosis , Type II Diabetes Diabetes Diabetes Date Acquired: 11/15/2019 11/15/2019 11/15/2019 Weeks of Treatment: 0 0 0 Wound Status: Open Open Open Measurements L x W x D (cm) 15x29x0.1 3x4.5x0.1 4x4x0.1 Area (cm) : 341.648 10.603 12.566 Volume (cm) : 34.165 1.06 1.257 Classification: Full Thickness Without Exposed Full Thickness Without Exposed Full Thickness Without Exposed Support Structures Support Structures Support Structures Exudate Amount: Large Medium Medium Exudate Type: Serous Serous Serous Exudate Color: Geophysical data processor Wound Margin: Indistinct, nonvisible Flat and Intact Indistinct, nonvisible Granulation Amount: Medium (34-66%) Small (1-33%) Small (1-33%) Granulation Quality: Pink Pink Pink Necrotic Amount: Medium (34-66%) Large (67-100%) Large (67-100%) Exposed Structures: Fat Layer (Subcutaneous Tissue) Fat Layer (Subcutaneous Tissue)  Fat Layer (Subcutaneous Tissue) Exposed: Yes Exposed: Yes Exposed: Yes Fascia: No Fascia: No Fascia: No Tendon: No Tendon: No Tendon: No Muscle: No Muscle: No Muscle: No Joint: No Joint: No Joint: No Bone: No Bone: No Bone: No Epithelialization: Small (1-33%) None None [name], [name] ([id number]) Treatment Notes Electronic Signature(s) Signed: 11/23/2019 4:40:14 PM By: Army Melia Entered By: Army Melia on 11/23/2019 14:03:08 [name] ([id number]) -------------------------------------------------------------------------------- [place] Details Patient Name: CAMERYN, CHRISLEY A. Date of Service: 11/23/2019 1:15 PM Medical Record Number: [id number] Patient Account Number: [id number] Date of Birth/Sex: 07/20/1951 (69 y.o. M) Treating RN: Army Melia Primary Care Lavra Imler: [name] Other Clinician: Referring Dennard Vezina: Referral, Self Treating Shanyce Daris/Extender:  Melburn Hake, HOYT Weeks in Treatment: 0 Active Inactive Nutrition Nursing Diagnoses: Impaired glucose control: actual or potential Goals: Patient/caregiver verbalizes understanding of need to maintain therapeutic glucose control per primary care physician Date Initiated: 11/23/2019 Target Resolution Date: 12/11/2019 Goal Status: Active Interventions: Assess patient nutrition upon admission and as needed per policy Notes: Orientation to the Wound Care Program Nursing Diagnoses: Knowledge deficit related to the wound healing center program Goals: Patient/caregiver will verbalize understanding of the [place] Date Initiated: 11/23/2019 Target Resolution Date: 12/11/2019 Goal Status: Active Interventions: Provide education on orientation to the wound center Notes: Wound/Skin Impairment Nursing Diagnoses: Impaired tissue integrity Goals: Ulcer/skin breakdown will have a volume reduction of 30% by week 4 Date Initiated: 11/23/2019 Target Resolution Date: 12/11/2019 Goal Status: Active Interventions: Assess ulceration(s) every visit Notes: Electronic Signature(s) Signed: 11/23/2019 4:40:14 PM By: Army Melia Entered By: Army Melia on 11/23/2019 14:02:29 MAJESTIC, BRISTER ([id number]) [address] Linus Mako ([id number]) -------------------------------------------------------------------------------- Pain Assessment Details Patient Name: Corey Cower A. Date of Service: 11/23/2019 1:15 PM Medical Record Number: [id number] Patient Account Number: [id number] Date of Birth/Sex: 1951/09/01 (69 y.o. M) Treating RN: Cornell Barman Primary Care Arrion Broaddus: [name] Other Clinician: Referring Ben Habermann: Referral, Self Treating Sparrow Sanzo/Extender: Melburn Hake, HOYT Weeks in Treatment: 0 Active Problems Location of Pain Severity and Description of Pain Patient Has Paino Yes Site Locations Rate the pain. Current Pain Level: 7 Pain Management and Medication Current Pain  Management: Electronic Signature(s) Signed: 11/23/2019 4:34:46 PM By: Lorine Bears RCP, RRT, CHT Signed: 12/04/2019 5:40:44 PM By: Gretta Cool, BSN, RN, CWS, Kim RN, BSN Entered By: Lorine Bears on 11/23/2019 13:13:24 [name] ([id number]) -------------------------------------------------------------------------------- Patient/Caregiver Education Details Patient Name: SPIKE, DESILETS A. Date of Service: 11/23/2019 1:15 PM Medical Record Number: [id number] Patient Account Number: [id number] Date of Birth/Gender: 30-Oct-1950 (69 y.o. M) Treating RN: Army Melia Primary Care Physician: [name] Other Clinician: Referring Physician: Referral, Self Treating Physician/Extender: Sharalyn Ink in Treatment: 0 Education Assessment Education Provided To: Patient Education Topics Provided Wound/Skin Impairment: Handouts: Caring for Your Ulcer Methods: Demonstration, Explain/Verbal Responses: State content correctly Electronic Signature(s) Signed: 11/23/2019 4:40:14 PM By: Army Melia Entered By: Army Melia on 11/23/2019 14:13:44 [name] ([id number]) -------------------------------------------------------------------------------- Wound Assessment Details Patient Name: Corey Cower A. Date of Service: 11/23/2019 1:15 PM Medical Record Number: [id number] Patient Account Number: [id number] Date of Birth/Sex: 03/25/1951 (69 y.o. M) Treating RN: Montey Hora Primary Care Felise Georgia: [name] Other Clinician: Referring Torrin Crihfield: Referral, Self Treating Illyanna Petillo/Extender: STONE III, HOYT Weeks in Treatment: 0 Wound Status Wound Number: 6 Primary Lymphedema Etiology: Wound Location: Left Lower Leg - Circumferential Wound Status: Open Wounding Event: Gradually Appeared Comorbid Cataracts, Anemia, Sleep Apnea, Hypertension, Date Acquired: 11/15/2019 History: Cirrhosis , Type II Diabetes Weeks Of Treatment: 0 Clustered Wound: No  Photos Wound  Measurements Length: (cm) 15 Width: (cm) 29 Depth: (cm) 0.1 Area: (cm) 341.648 Volume: (cm) 34.165 % Reduction in Area: % Reduction in Volume: Epithelialization: Small (1-33%) Tunneling: No Undermining: No Wound Description Classification: Full Thickness Without Exposed Support Structures Wound Margin: Indistinct, nonvisible Exudate Amount: Large Exudate Type: Serous Exudate Color: amber Foul Odor After Cleansing: No Slough/Fibrino Yes Wound Bed Granulation Amount: Medium (34-66%) Exposed Structure Granulation Quality: Pink Fascia Exposed: No Necrotic Amount: Medium (34-66%) Fat Layer (Subcutaneous Tissue) Exposed: Yes Necrotic Quality: Adherent Slough Tendon Exposed: No Muscle Exposed: No Joint Exposed: No Bone Exposed: No Treatment Notes Wound #6 (Left, Circumferential Lower Leg) Notes Xtrasorb, ABD along shin, unna boot bilateral Electronic Signature(s) Signed: 11/23/2019 4:53:18 PM By: Chauncy Lean, Linus Mako ([id number]) Entered By: Montey Hora on 11/23/2019 13:33:26 [name] ([id number]) -------------------------------------------------------------------------------- Wound Assessment Details Patient Name: Corey Cower A. Date of Service: 11/23/2019 1:15 PM Medical Record Number: [id number] Patient Account Number: [id number] Date of Birth/Sex: 04/11/1951 (69 y.o. M) Treating RN: Montey Hora Primary Care Quynh Basso: [name] Other Clinician: Referring Jlynn Langille: Referral, Self Treating Chance Munter/Extender: STONE III, HOYT Weeks in Treatment: 0 Wound Status Wound Number: 7 Primary Lymphedema Etiology: Wound Location: Right Lower Leg - Posterior Wound Status: Open Wounding Event: Gradually Appeared Comorbid Cataracts, Anemia, Sleep Apnea, Hypertension, Date Acquired: 11/15/2019 History: Cirrhosis , Type II Diabetes Weeks Of Treatment: 0 Clustered Wound: No Photos Wound Measurements Length: (cm) 3 Width: (cm) 4.5 Depth: (cm) 0.1 Area:  (cm) 10.603 Volume: (cm) 1.06 % Reduction in Area: % Reduction in Volume: Epithelialization: None Tunneling: No Undermining: No Wound Description Classification: Full Thickness Without Exposed Support Structures Wound Margin: Flat and Intact Exudate Amount: Medium Exudate Type: Serous Exudate Color: amber Foul Odor After Cleansing: No Slough/Fibrino Yes Wound Bed Granulation Amount: Small (1-33%) Exposed Structure Granulation Quality: Pink Fascia Exposed: No Necrotic Amount: Large (67-100%) Fat Layer (Subcutaneous Tissue) Exposed: Yes Necrotic Quality: Adherent Slough Tendon Exposed: No Muscle Exposed: No Joint Exposed: No Bone Exposed: No Treatment Notes Wound #7 (Right, Posterior Lower Leg) Notes Xtrasorb, ABD along shin, unna boot bilateral Electronic Signature(s) Signed: 11/23/2019 4:53:18 PM By: Chauncy Lean, Linus Mako ([id number]) Entered By: Montey Hora on 11/23/2019 13:35:22 [name] ([id number]) -------------------------------------------------------------------------------- Wound Assessment Details Patient Name: Corey Cower A. Date of Service: 11/23/2019 1:15 PM Medical Record Number: [id number] Patient Account Number: [id number] Date of Birth/Sex: May 05, 1951 (69 y.o. M) Treating RN: Montey Hora Primary Care Omolola Mittman: [name] Other Clinician: Referring Analei Whinery: Referral, Self Treating Christena Sunderlin/Extender: STONE III, HOYT Weeks in Treatment: 0 Wound Status Wound Number: 8 Primary Lymphedema Etiology: Wound Location: Right Lower Leg - Anterior Wound Status: Open Wounding Event: Gradually Appeared Comorbid Cataracts, Anemia, Sleep Apnea, Hypertension, Date Acquired: 11/15/2019 History: Cirrhosis , Type II Diabetes Weeks Of Treatment: 0 Clustered Wound: No Photos Wound Measurements Length: (cm) 4 Width: (cm) 4 Depth: (cm) 0.1 Area: (cm) 12.566 Volume: (cm) 1.257 % Reduction in Area: % Reduction in Volume: Epithelialization:  None Tunneling: No Undermining: No Wound Description Classification: Full Thickness Without Exposed Support Structures Wound Margin: Indistinct, nonvisible Exudate Amount: Medium Exudate Type: Serous Exudate Color: amber Foul Odor After Cleansing: No Slough/Fibrino Yes Wound Bed Granulation Amount: Small (1-33%) Exposed Structure Granulation Quality: Pink Fascia Exposed: No Necrotic Amount: Large (67-100%) Fat Layer (Subcutaneous Tissue) Exposed: Yes Necrotic Quality: Adherent Slough Tendon Exposed: No Muscle Exposed: No Joint Exposed: No Bone Exposed: No Treatment Notes Wound #8 (Right, Anterior Lower Leg) Notes Xtrasorb, ABD along shin, unna boot bilateral  Electronic Signature(s) Signed: 11/23/2019 4:53:18 PM By: Chauncy Lean, Linus Mako ([id number]) Entered By: Montey Hora on 11/23/2019 13:37:05 [name] ([id number]) -------------------------------------------------------------------------------- [place] Details Patient Name: SUNG, PARODI A. Date of Service: 11/23/2019 1:15 PM Medical Record Number: [id number] Patient Account Number: [id number] Date of Birth/Sex: 1950/12/06 (69 y.o. M) Treating RN: Cornell Barman Primary Care Chozen Latulippe: [name] Other Clinician: Referring Lasya Vetter: Referral, Self Treating Jalal Rauch/Extender: Melburn Hake, HOYT Weeks in Treatment: 0 Vital Signs Time Taken: 13:10 Temperature (F): 97.9 Height (in): 72 Pulse (bpm): 79 Source: Stated Respiratory Rate (breaths/min): 18 Weight (lbs): 318 Blood Pressure (mmHg): 179/65 Source: Measured Reference Range: 80 - 120 mg / dl Body Mass Index (BMI): 43.1 Electronic Signature(s) Signed: 11/23/2019 4:34:46 PM By: Lorine Bears RCP, RRT, CHT Entered By: Lorine Bears on 11/23/2019 13:16:23

## 2019-12-04 NOTE — Progress Notes (Signed)
VAIL, VUNCANNON ([id number]) Visit Report for 11/23/2019 Chief Complaint Document Details Patient Name: Corey West, Corey West. Date of Service: 11/23/2019 1:15 PM Medical Record Number: [id number] Patient Account Number: [id number] Date of Birth/Sex: [birth year] (69 y.o. M) Treating RN: Cornell Barman Primary Care Provider: [name] Other Clinician: Referring Provider: Referral, Self Treating Provider/Extender: Melburn Hake, Leverett Camplin Weeks in Treatment: 0 Information Obtained from: Patient Chief Complaint Bilateral LE Ulcers Electronic Signature(s) Signed: 11/23/2019 1:55:45 PM By: Worthy Keeler PA-C Entered By: Worthy Keeler on 11/23/2019 13:55:44 Corey West ([id number]) -------------------------------------------------------------------------------- Debridement Details Patient Name: Corey West A. Date of Service: 11/23/2019 1:15 PM Medical Record Number: [id number] Patient Account Number: [id number] Date of Birth/Sex: Nov 09, 1950 (69 y.o. M) Treating RN: Cornell Barman Primary Care Provider: [name] Other Clinician: Referring Provider: Referral, Self Treating Provider/Extender: Melburn Hake, Patsy Zaragoza Weeks in Treatment: 0 Debridement Performed for Wound #6 Left,Circumferential Lower Leg Assessment: Performed By: Physician STONE III, Grantley Savage E., PA-C Debridement Type: Debridement Level of Consciousness (Pre- Awake and Alert procedure): Pre-procedure Verification/Time Out Yes - 13:00 Taken: Start Time: 13:01 Pain Control: Lidocaine 2% Topical Gel Total Area Debrided (L x W): 5 (cm) x 5 (cm) = 25 (cm) Tissue and other material debrided: Viable, Non-Viable, Blood Clots, Callus, Skin: Dermis Level: Skin/Dermis Debridement Description: Selective/Open Wound Instrument: Forceps, Scissors Bleeding: None End Time: 13:02 Response to Treatment: Procedure was tolerated well Level of Consciousness (Post- Awake and Alert procedure): Post Debridement Measurements of Total Wound Length: (cm)  15 Width: (cm) 29 Depth: (cm) 0.1 Volume: (cm) 34.165 Character of Wound/Ulcer Post Debridement: Stable Post Procedure Diagnosis Same as Pre-procedure Notes This was skin sloughing off of the heel and bottom of the patient's foot Electronic Signature(s) Signed: 11/23/2019 2:19:57 PM By: Worthy Keeler PA-C Signed: 12/04/2019 5:40:44 PM By: Gretta Cool, BSN, RN, CWS, Kim RN, BSN Entered By: Worthy Keeler on 11/23/2019 14:19:57 Corey West ([id number]) -------------------------------------------------------------------------------- HPI Details Patient Name: Corey West Date of Service: 11/23/2019 1:15 PM Medical Record Number: [id number] Patient Account Number: [id number] Date of Birth/Sex: 1951/08/17 (69 y.o. M) Treating RN: Cornell Barman Primary Care Provider: [name] Other Clinician: Referring Provider: Referral, Self Treating Provider/Extender: Melburn Hake, Daud Cayer Weeks in Treatment: 0 History of Present Illness HPI Description: Pleasant 69 year old with a past medical history significant for morbid obesity, insulin-dependent type 2 diabetes (hemoglobin A1c 7.2 in January 2015), cirrhosis, and chronic kidney disease. He reports a left anterior calf ulcer since January 2015. He has had venous stasis ulcers in the past, which have healed spontaneously. No history of DVT. Does not regularly wear compression. Ambulating normally per his baseline. Recently completed a course of antibiotics. Treated with a Profore light and Coban 2 layer compression drainage, which both fell down and he cut off. Tolerated Coban lite compression bandage. Returns to clinic for followup. No complaints today. No pain at this time. No fever or chills. No open wounds at this time. He is having difficulty finding compression garments that fit him. 04/23/17; this is now 69 year old man who returns to our clinic for nonhealing wounds on the right lateral and right posterior calf. He tells me that they have  been present for 6 months. In the setting of severe right greater than left lower extremity lymphedema likely secondary to chronic venous insufficiency. He has been followed in the lymphedema clinic at least since early June with compression wraps although these have not really helped and the patient is here for our review of this situation. The patient has  had problems with edema in his legs for years. He has not worn compression. He is type II diabetic that sounds poorly controlled. He also has obstructive sleep apnea and cirrhosis. 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 This is no doubt secondary to chronic venous insufficiency. He was going to the lymphedema clinic who forwarded him here due to skin breakdown in the right leg. He has not previously worn compression stockings. He has a large area of denuded epithelium on the posterior leg. He has a more open wound on the right lateral leg 05/07/17 on evaluation today patient appears to be doing about the same his wounds are showing signs of improvement and looking fairly well. With that being said he is not having any severe discomfort just some mild aching due to the swelling and really this is worse on the left than the right and the right side is where he actually has his wounds. He has been tolerating the [employer] wraps without complication and states this is actually much better than the wraps that he was getting at the lymphedema clinic most recently. He is happy with these wraps. He has no nausea, vomiting, diarrhea and no fevers or chills. We are still waiting to hear back in regard to the lymphedema pumps which I do think would be beneficial for him even with the compression wraps he still has a significant amount of edema noted of the bilateral lower extremities. 05/15/17 on evaluation today patient's right lower  extremity edema and weeping especially the posterior portion appears to be doing better. He still is having some issue here but fortunately it's minimal compared to previous weeks. He continues to tolerate the compression fairly well. Overall he is pleased with how things are progressing. No fevers, chills, nausea, or vomiting noted at this time. And patient is having only minimal discomfort. 05/22/17; the medial wound on the right leg actually looks considerably better and is epithelialized. He still has a small punched out area on the right lateral leg. Edema control is marginal end of the [employer]. We are working on getting him compression pumps 05/29/17; the patient's lymphedema pumps are apparently arriving tonight. In the meantime he took his Unna boots off last Sunday because the edema had progressed to the point that a word to titrate. Since then he has put nothing on his right leg and more specifically nothing on the right medial and lateral wounds 06/05/17; the patient has his lymphedema pumps and he is using them once a day on the right leg. There is a sizable reduction in the circumference of his leg although he arrived in clinic today somewhat short of breath. 07/02/17; since the patient was last in clinic he was admitted on 2 occasions. He was admitted from 9/12 through 9/13 with lactic acidosis weakness and felt to have left lower extremity cellulitis. Due to the elevated lactic acid level he was felt to have sepsis although this was ruled out and the lactic acidosis was felt secondary to his chronic cirrhosis. He was initially treated with IV vancomycin for cellulitis and redness of his left leg but then this was subsequently recognized to be secondary to chronic venous stasis and chronic lymphedema. When he left the hospital he had Unna boots placed bilaterally. He was readmitted on 9/26 through 9/28 with GI bleeding. His EGD showed Barrett's esophagus and gastritis. He was  discharged with  a hemoglobin of 8 to follow-up with GI as an outpatient. There was nothing more about his lower extremities during this short hospital stay. The patient has not been using his external compression pumps fearing fluid overload and shortness of breath although he feels better now that his hemoglobin is stabilized. He continues to have epithelial loss on his bilateral lower extremities. 07/09/17; patient has a large but superficial area on the right posterior leg and on the left anterior leg. He tells me he is been using his external compression pumps once a day for the required hour. He states the pumps hurt his right ankle. 07/17/17; patient was transfused a further 2 units since he was here the last time. His hemoglobin was once again found to be low. He has not been able to use his external compression pumps because of a combination of prolonged power outage in his home and I think issues with his other medical care. His wounds have actually worsened he now has loss of epithelialization all the way from the right lateral to the right medial posteriorly. The left side which has anterior loss of epithelialization also appears to be worse on the lateral aspect of the leg where there is denuded epithelium now as well. Leaking edema fluid. He has bilateral lymphedema. 08/12/2017 -- the patient has not been here for 25 days and has had the same compression wraps which have not been changed!! He says he was being worked up for a GI bleed and had some transfusions since he was seen last by Dr. Dellia Nims 08/20/17; mostly by review of what the patient is saying and a quick glance at his records in care everywhere I think this patient probably has Avrohom, Mckelvin, Corey A. ([id number]) cirrhosis and portal hypertension. He is bleeding I think from gastric area and he is requiring recurrent transfusions. He has been told that there is nothing more that can be done and that his prognosis is poor. He arrived in our office  in our office in tears. We've been using silver alginate and 3 layer compression to his bilateral lower extremity wounds. He has significant lymphedema likely secondary to chronic venous insufficiency. He has external compression pumps at home that he had actually before he came to this clinic but he is not using 08/27/17; patient is doing well, wounds on his left leg are tiny one anteriorly and one laterally. He still has substantial wound on the posterior aspect of his right calf extending laterally for over this looks better than last time. He tells me he is able to use his compression pumps once a day on most days. He has his hemoglobin checked on Friday 09-19-17 He arrives today for follow up evaluation. He was unable to come to the clinic last week r/t weather conditions, therefore compression has been in place for two weeks. He presents today with healed LLE and one partial thickness open area to the posterior RL. He states he is experiencing diarrhea secondary to antibiotic that will be stopped next week and has been unable to use lymphedema pumps. Will continue with compression bilaterally, follow up next week with plans to discharge and transition to lymphedema pumps 10/02/17; the patient arrives today with his juxta light stockings bilaterally. He also has external compression pumps at home. There are no open areas on his bilateral legs. He tells me he has had adjustments in his diuretics and had resultant hypokalemia but the amount of edema in his legs is a lot  better although he stillhas changes of lymphedema 10/16/17; the patient has been using his juxta lites although they are not put on as high as we might like. He has very superficial excoriations at the top of his wraps but I don't think that anything to keep him in clinic here. Severe bilateral chronic venous insufficiency with secondary lymphedema. He has no open wound areas Readmission: 11/23/2019 patient presents today for  readmission he was last seen here in the clinic on 10/16/2017. With that being said he is unfortunately continue to have issues with lower extremity edema though he was using his lymphedema pumps on a fairly regular basis. Fortunately there is no signs of infection at this time which is good news there is a little bit of erythema that have me wondering about the possibility of infection but again I think this may just be secondary to the inflammation from the lymphedema and the current weeping causing skin breakdown as well. His plantar aspect of his foot on the left lower extremity actually is sloughing off a lot of callus skin externally there is nothing open under this area but nonetheless it does seem to be causing him some difficulty. Fortunately there is no signs of active infection at this time. No fevers, chills, nausea, vomiting, or diarrhea. The patient does have a history of diabetes mellitus type 2, hypertension, and lymphedema. Electronic Signature(s) Signed: 11/23/2019 2:15:19 PM By: Worthy Keeler PA-C Entered By: Worthy Keeler on 11/23/2019 14:15:19 Corey West ([id number]) -------------------------------------------------------------------------------- Physical Exam Details Patient Name: Corey West, Corey West. Date of Service: 11/23/2019 1:15 PM Medical Record Number: [id number] Patient Account Number: [id number] Date of Birth/Sex: Jul 29, 1951 (69 y.o. M) Treating RN: Cornell Barman Primary Care Provider: [name] Other Clinician: Referring Provider: Referral, Self Treating Provider/Extender: Melburn Hake, Basilia Stuckert Weeks in Treatment: 0 Constitutional patient is hypertensive.. pulse regular and within target range for patient.Marland Kitchen respirations regular, non-labored and within target range for patient.Marland Kitchen temperature within target range for patient.. Well-nourished and well-hydrated in no acute distress. Eyes conjunctiva clear no eyelid edema noted. pupils equal round and reactive to light and  accommodation. Ears, Nose, Mouth, and Throat no gross abnormality of ear auricles or external auditory canals. normal hearing noted during conversation. mucus membranes moist. Respiratory normal breathing without difficulty. Cardiovascular 1+ dorsalis pedis/posterior tibialis pulses. stage 3 lymphedema. Musculoskeletal normal gait and posture. no significant deformity or arthritic changes, no loss or range of motion, no clubbing. Psychiatric this patient is able to make decisions and demonstrates good insight into disease process. Alert and Oriented x 3. pleasant and cooperative. Notes Upon evaluation today the patient's legs do not appear to be doing quite as well obviously as we would like to see. He does have significant edema noted bilaterally on the left he is weeping quite profusely. He does not have any obvious signs of physical vascular compromise although his ABIs were noncompressible bilaterally. For that reason I do think he is going to require arterial study with ABIs and TBI's to evaluate and ensure that he has appropriate blood flow. Electronic Signature(s) Signed: 11/23/2019 2:16:36 PM By: Worthy Keeler PA-C Entered By: Worthy Keeler on 11/23/2019 14:16:36 Corey West ([id number]) -------------------------------------------------------------------------------- Physician Orders Details Patient Name: KYREL, LEIGHTON A. Date of Service: 11/23/2019 1:15 PM Medical Record Number: [id number] Patient Account Number: [id number] Date of Birth/Sex: 1950/10/10 (70 y.o. M) Treating RN: Army Melia Primary Care Provider: [name] Other Clinician: Referring Provider: Referral, Self Treating Provider/Extender: STONE III, Emonnie Cannady Weeks in Treatment:  0 Verbal / Phone Orders: No Diagnosis Coding ICD-10 Coding Code Description I89.0 Lymphedema, not elsewhere classified L97.812 Non-pressure chronic ulcer of other part of right lower leg with fat layer exposed L97.822 Non-pressure  chronic ulcer of other part of left lower leg with fat layer exposed E11.622 Type 2 diabetes mellitus with other skin ulcer I10 Essential (primary) hypertension Wound Cleansing Wound #6 Left,Circumferential Lower Leg o Clean wound with Normal Saline. Wound #7 Right,Posterior Lower Leg o Clean wound with Normal Saline. Wound #8 Right,Anterior Lower Leg o Clean wound with Normal Saline. Primary Wound Dressing Wound #6 Left,Circumferential Lower Leg o XtraSorb Wound #7 Right,Posterior Lower Leg o XtraSorb Wound #8 Right,Anterior Lower Leg o XtraSorb Dressing Change Frequency Wound #6 Left,Circumferential Lower Leg o Change Dressing Monday, Wednesday, Friday - Monday in office, Wednesday and Friday by Home health Wound #7 Right,Posterior Lower Leg o Change Dressing Monday, Wednesday, Friday - Monday in office, Wednesday and Friday by Home health Wound #8 Right,Anterior Lower Leg o Change Dressing Monday, Wednesday, Friday - Monday in office, Wednesday and Friday by Home health Follow-up Appointments Wound #6 Left,Circumferential Lower Leg o Return Appointment in 1 week. Wound #7 Right,Posterior Lower Leg o Return Appointment in 1 week. Wound #8 Right,Anterior Lower Leg o Return Appointment in 1 week. Edema Control Wound #6 Left,Circumferential Lower Leg o Unna Boots Bilaterally - Cut ABD pads to line along shin for protection while cutting off off unnaboots ZAKARIYA, KNICKERBOCKER ([id number]) Wound #7 Right,Posterior Lower Leg o Unna Boots Bilaterally - Cut ABD pads to line along shin for protection while cutting off off unnaboots Wound #8 Right,Anterior Lower Leg o Unna Boots Bilaterally - Cut ABD pads to line along shin for protection while cutting off off unnaboots Home Health Wound #6 Left,Circumferential Lower Leg o [place] Visits - [place] Nurse may visit PRN to address patientos wound care needs. o FACE TO FACE  ENCOUNTER: MEDICARE and MEDICAID PATIENTS: I certify that this patient is under my care and that I had a face-to- face encounter that meets the physician face-to-face encounter requirements with this patient on this date. The encounter with the patient was in whole or in part for the following MEDICAL CONDITION: (primary reason for [place]) MEDICAL NECESSITY: I certify, that based on my findings, NURSING services are a medically necessary home health service. HOME BOUND STATUS: I certify that my clinical findings support that this patient is homebound (i.e., Due to illness or injury, pt requires aid of supportive devices such as crutches, cane, wheelchairs, walkers, the use of special transportation or the assistance of another person to leave their place of residence. There is a normal inability to leave the home and doing so requires considerable and taxing effort. Other absences are for medical reasons / religious services and are infrequent or of short duration when for other reasons). o If current dressing causes regression in wound condition, may D/C ordered dressing product/s and apply Normal Saline Moist Dressing daily until next [place] / Other MD appointment. [place] of regression in wound condition at [phone]. o Please direct any NON-WOUND related issues/requests for orders to patient's Primary Care Physician Wound #7 [place] Visits - [place] Nurse may visit PRN to address patientos wound care needs. o FACE TO FACE ENCOUNTER: MEDICARE and MEDICAID PATIENTS: I certify that this patient is under my care and that I had a face-to- face encounter that meets  the physician face-to-face encounter requirements with this patient on this date. The encounter with the patient was in whole or in part for the following MEDICAL CONDITION: (primary reason for [place]) MEDICAL  NECESSITY: I certify, that based on my findings, NURSING services are a medically necessary home health service. HOME BOUND STATUS: I certify that my clinical findings support that this patient is homebound (i.e., Due to illness or injury, pt requires aid of supportive devices such as crutches, cane, wheelchairs, walkers, the use of special transportation or the assistance of another person to leave their place of residence. There is a normal inability to leave the home and doing so requires considerable and taxing effort. Other absences are for medical reasons / religious services and are infrequent or of short duration when for other reasons). o If current dressing causes regression in wound condition, may D/C ordered dressing product/s and apply Normal Saline Moist Dressing daily until next [place] / Other MD appointment. [place] of regression in wound condition at [phone]. o Please direct any NON-WOUND related issues/requests for orders to patient's Primary Care Physician Wound #8 [place] Visits - [place] Nurse may visit PRN to address patientos wound care needs. o FACE TO FACE ENCOUNTER: MEDICARE and MEDICAID PATIENTS: I certify that this patient is under my care and that I had a face-to- face encounter that meets the physician face-to-face encounter requirements with this patient on this date. The encounter with the patient was in whole or in part for the following MEDICAL CONDITION: (primary reason for [place]) MEDICAL NECESSITY: I certify, that based on my findings, NURSING services are a medically necessary home health service. HOME BOUND STATUS: I certify that my clinical findings support that this patient is homebound (i.e., Due to illness or injury, pt requires aid of supportive devices such as crutches, cane, wheelchairs, walkers, the use of special transportation or the  assistance of another person to leave their place of residence. There is a normal inability to leave the home and doing so requires considerable and taxing effort. Other absences are for medical reasons / religious services and are infrequent or of short duration when for other reasons). o If current dressing causes regression in wound condition, may D/C ordered dressing product/s and apply Normal Saline Moist Dressing daily until next [place] / Other MD appointment. [place] of regression in wound condition at [phone]. o Please direct any NON-WOUND related issues/requests for orders to patient's Primary Care Physician Electronic Signature(s) Signed: 11/23/2019 4:40:14 PM By: Army Melia Signed: 11/23/2019 5:29:33 PM By: Worthy Keeler PA-C Entered By: Army Melia on 11/23/2019 14:13:01 Corey West ([id number]) -------------------------------------------------------------------------------- Problem List Details Patient Name: Corey West, Corey A. Date of Service: 11/23/2019 1:15 PM Medical Record Number: [id number] Patient Account Number: [id number] Date of Birth/Sex: 12-31-1950 (69 y.o. M) Treating RN: Cornell Barman Primary Care Provider: [name] Other Clinician: Referring Provider: Referral, Self Treating Provider/Extender: Melburn Hake, Lamoine Fredricksen Weeks in Treatment: 0 Active Problems ICD-10 Evaluated Encounter Code Description Active Date Today Diagnosis I89.0 Lymphedema, not elsewhere classified 11/23/2019 No Yes L97.812 Non-pressure chronic ulcer of other part of right lower leg with fat layer 11/23/2019 No Yes exposed L97.822 Non-pressure chronic ulcer of other part of left lower leg with fat layer 11/23/2019 No Yes exposed E11.622 Type 2 diabetes mellitus with other skin ulcer 11/23/2019 No Yes I10 Essential (primary) hypertension 11/23/2019 No Yes Inactive Problems Resolved Problems  Electronic Signature(s) Signed: 11/23/2019 1:55:21 PM By: Worthy Keeler PA-C Entered By: Worthy Keeler on 11/23/2019 13:55:21 Corey West ([id number]) -------------------------------------------------------------------------------- Progress Note Details Patient Name: Corey West, Corey A. Date of Service: 11/23/2019 1:15 PM Medical Record Number: [id number] Patient Account Number: [id number] Date of Birth/Sex: 17-Oct-1950 (69 y.o. M) Treating RN: Cornell Barman Primary Care Provider: [name] Other Clinician: Referring Provider: Referral, Self Treating Provider/Extender: Melburn Hake, Aeson Sawyers Weeks in Treatment: 0 Subjective Chief Complaint Information obtained from Patient Bilateral LE Ulcers History of Present Illness (HPI) Pleasant 69 year old with a past medical history significant for morbid obesity, insulin-dependent type 2 diabetes (hemoglobin A1c 7.2 in January 2015), cirrhosis, and chronic kidney disease. He reports a left anterior calf ulcer since January 2015. He has had venous stasis ulcers in the past, which have healed spontaneously. No history of DVT. Does not regularly wear compression. Ambulating normally per his baseline. Recently completed a course of antibiotics. Treated with a Profore light and Coban 2 layer compression drainage, which both fell down and he cut off. Tolerated Coban lite compression bandage. Returns to clinic for followup. No complaints today. No pain at this time. No fever or chills. No open wounds at this time. He is having difficulty finding compression garments that fit him. 04/23/17; this is now 69 year old man who returns to our clinic for nonhealing wounds on the right lateral and right posterior calf. He tells me that they have been present for 6 months. In the setting of severe right greater than left lower extremity lymphedema likely secondary to chronic venous insufficiency. He has been followed in the lymphedema clinic at least since early June with compression wraps although these have not really helped and the  patient is here for our review of this situation. The patient has had problems with edema in his legs for years. He has not worn compression. He is type II diabetic that sounds poorly controlled. He also has obstructive sleep apnea and cirrhosis. 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 This is no doubt secondary to chronic venous insufficiency. He was going to the lymphedema clinic who forwarded him here due to skin breakdown in the right leg. He has not previously worn compression stockings. He has a large area of denuded epithelium on the posterior leg. He has a more open wound on the right lateral leg 05/07/17 on evaluation today patient appears to be doing about the same his wounds are showing signs of improvement and looking fairly well. With that being said he is not having any severe discomfort just some mild aching due to the swelling and really this is worse on the left than the right and the right side is where he actually has his wounds. He has been tolerating the [employer] wraps without complication and states this is actually much better than the wraps that he was getting at the lymphedema clinic most recently. He is happy with these wraps. He has no nausea, vomiting, diarrhea and no fevers or chills. We are still waiting to hear back in regard to the lymphedema pumps which I do think would be beneficial for him even with the compression wraps he still has a significant amount of edema noted of the bilateral lower extremities. 05/15/17 on evaluation today patient's right lower extremity edema and weeping especially the posterior portion appears to be doing better. He still is having some  issue here but fortunately it's minimal compared to previous weeks. He continues to tolerate the compression fairly well. Overall he is pleased with how things are progressing. No  fevers, chills, nausea, or vomiting noted at this time. And patient is having only minimal discomfort. 05/22/17; the medial wound on the right leg actually looks considerably better and is epithelialized. He still has a small punched out area on the right lateral leg. Edema control is marginal end of the [employer]. We are working on getting him compression pumps 05/29/17; the patient's lymphedema pumps are apparently arriving tonight. In the meantime he took his Unna boots off last Sunday because the edema had progressed to the point that a word to titrate. Since then he has put nothing on his right leg and more specifically nothing on the right medial and lateral wounds 06/05/17; the patient has his lymphedema pumps and he is using them once a day on the right leg. There is a sizable reduction in the circumference of his leg although he arrived in clinic today somewhat short of breath. 07/02/17; since the patient was last in clinic he was admitted on 2 occasions. He was admitted from 9/12 through 9/13 with lactic acidosis weakness and felt to have left lower extremity cellulitis. Due to the elevated lactic acid level he was felt to have sepsis although this was ruled out and the lactic acidosis was felt secondary to his chronic cirrhosis. He was initially treated with IV vancomycin for cellulitis and redness of his left leg but then this was subsequently recognized to be secondary to chronic venous stasis and chronic lymphedema. When he left the hospital he had Unna boots placed bilaterally. He was readmitted on 9/26 through 9/28 with GI bleeding. His EGD showed Barrett's esophagus and gastritis. He was discharged with a hemoglobin of 8 to follow-up with GI as an outpatient. There was nothing more about his lower extremities during this short hospital stay. The patient has not been using his external compression pumps fearing fluid overload and shortness of breath although he feels better now that  his hemoglobin is stabilized. He continues to have epithelial loss on his bilateral lower extremities. 07/09/17; patient has a large but superficial area on the right posterior leg and on the left anterior leg. He tells me he is been using his external compression pumps once a day for the required hour. He states the pumps hurt his right ankle. 07/17/17; patient was transfused a further 2 units since he was here the last time. His hemoglobin was once again found to be low. He has not been able to use his external compression pumps because of a combination of prolonged power outage in his home and I think issues with his other medical care. His wounds have actually worsened he now has loss of epithelialization all the way from the right lateral to the right medial posteriorly. The left side which has anterior loss of epithelialization also appears to be worse on the lateral aspect of the leg where there is denuded epithelium now as PERVIS, MACINTYRE ([id number]) well. Leaking edema fluid. He has bilateral lymphedema. 08/12/2017 -- the patient has not been here for 25 days and has had the same compression wraps which have not been changed!! He says he was being worked up for a GI bleed and had some transfusions since he was seen last by Dr. Dellia Nims 08/20/17; mostly by review of what the patient is saying and a quick glance at his records in care  everywhere I think this patient probably has [place], cirrhosis and portal hypertension. He is bleeding I think from gastric area and he is requiring recurrent transfusions. He has been told that there is nothing more that can be done and that his prognosis is poor. He arrived in our office in our office in tears. We've been using silver alginate and 3 layer compression to his bilateral lower extremity wounds. He has significant lymphedema likely secondary to chronic venous insufficiency. He has external compression pumps at home that he had actually before he came to  this clinic but he is not using 08/27/17; patient is doing well, wounds on his left leg are tiny one anteriorly and one laterally. He still has substantial wound on the posterior aspect of his right calf extending laterally for over this looks better than last time. He tells me he is able to use his compression pumps once a day on most days. He has his hemoglobin checked on Friday 09-19-17 He arrives today for follow up evaluation. He was unable to come to the clinic last week r/t weather conditions, therefore compression has been in place for two weeks. He presents today with healed LLE and one partial thickness open area to the posterior RL. He states he is experiencing diarrhea secondary to antibiotic that will be stopped next week and has been unable to use lymphedema pumps. Will continue with compression bilaterally, follow up next week with plans to discharge and transition to lymphedema pumps 10/02/17; the patient arrives today with his juxta light stockings bilaterally. He also has external compression pumps at home. There are no open areas on his bilateral legs. He tells me he has had adjustments in his diuretics and had resultant hypokalemia but the amount of edema in his legs is a lot better although he stillhas changes of lymphedema 10/16/17; the patient has been using his juxta lites although they are not put on as high as we might like. He has very superficial excoriations at the top of his wraps but I don't think that anything to keep him in clinic here. Severe bilateral chronic venous insufficiency with secondary lymphedema. He has no open wound areas Readmission: 11/23/2019 patient presents today for readmission he was last seen here in the clinic on 10/16/2017. With that being said he is unfortunately continue to have issues with lower extremity edema though he was using his lymphedema pumps on a fairly regular basis. Fortunately there is no signs of infection at this time which is good  news there is a little bit of erythema that have me wondering about the possibility of infection but again I think this may just be secondary to the inflammation from the lymphedema and the current weeping causing skin breakdown as well. His plantar aspect of his foot on the left lower extremity actually is sloughing off a lot of callus skin externally there is nothing open under this area but nonetheless it does seem to be causing him some difficulty. Fortunately there is no signs of active infection at this time. No fevers, chills, nausea, vomiting, or diarrhea. The patient does have a history of diabetes mellitus type 2, hypertension, and lymphedema. Patient History Information obtained from Patient. Allergies No Known Drug Allergies Family History Diabetes - Mother, Heart Disease - Father, Hypertension - Mother, Seizures - Father, No family history of Cancer, Kidney Disease, Lung Disease, Stroke, Thyroid Problems. Social History Light tobacco smoker, Alcohol Use - Never, Drug Use - No History, Caffeine Use - Moderate. Medical History  Eyes Patient has history of Cataracts Denies history of Glaucoma, Optic Neuritis Ear/Nose/Mouth/Throat Denies history of Chronic sinus problems/congestion, Middle ear problems Hematologic/Lymphatic Patient has history of Anemia Denies history of Hemophilia, Human Immunodeficiency Virus, Lymphedema, Sickle Cell Disease Respiratory Patient has history of Sleep Apnea Denies history of Aspiration, Asthma, Chronic Obstructive Pulmonary Disease (COPD), Pneumothorax, Tuberculosis Cardiovascular Patient has history of Hypertension Denies history of Angina, Arrhythmia, Congestive Heart Failure, Coronary Artery Disease, Deep Vein Thrombosis, Hypotension, Myocardial Infarction, Peripheral Arterial Disease, Peripheral Venous Disease, Phlebitis, Vasculitis Gastrointestinal Patient has history of Cirrhosis Denies history of Colitis, Crohn s, Hepatitis A, Hepatitis  B, Hepatitis C Endocrine Patient has history of Type II Diabetes Denies history of Type I Diabetes Genitourinary Denies history of End Stage Renal Disease KIONTE, BAUMGARDNER ([id number]) Immunological Denies history of Lupus Erythematosus, Raynaud s, Scleroderma Integumentary (Skin) Denies history of History of Burn, History of pressure wounds Musculoskeletal Denies history of Gout, Rheumatoid Arthritis, Osteoarthritis, Osteomyelitis Neurologic Denies history of Dementia, Neuropathy, Quadriplegia, Paraplegia, Seizure Disorder Oncologic Denies history of Received Chemotherapy, Received Radiation Psychiatric Denies history of Anorexia/bulimia, Confinement Anxiety Hospitalization/Surgery History - passed out and fell. Medical And Surgical History Notes Constitutional Symptoms (General Health) high bood pressure, liver function decline, diabetic, Cirrhosis of liver Genitourinary antibiotics for kidney infection Review of Systems (ROS) Constitutional Symptoms (General Health) Denies complaints or symptoms of Fatigue, Fever, Chills, Marked Weight Change. Eyes Denies complaints or symptoms of Dry Eyes, Vision Changes, Glasses / Contacts. Ear/Nose/Mouth/Throat Denies complaints or symptoms of Difficult clearing ears, Sinusitis. Hematologic/Lymphatic Denies complaints or symptoms of Bleeding / Clotting Disorders, Human Immunodeficiency Virus. Respiratory Denies complaints or symptoms of Chronic or frequent coughs, Shortness of Breath. Cardiovascular Complains or has symptoms of LE edema. Denies complaints or symptoms of Chest pain. Gastrointestinal Denies complaints or symptoms of Frequent diarrhea, Nausea, Vomiting. Endocrine Denies complaints or symptoms of Hepatitis, Thyroid disease, Polydypsia (Excessive Thirst). Genitourinary Denies complaints or symptoms of Kidney failure/ Dialysis, Incontinence/dribbling. Immunological Denies complaints or symptoms of Hives,  Itching. Integumentary (Skin) Complains or has symptoms of Wounds, Swelling. Denies complaints or symptoms of Bleeding or bruising tendency, Breakdown. Musculoskeletal Denies complaints or symptoms of Muscle Pain, Muscle Weakness. Neurologic Denies complaints or symptoms of Numbness/parasthesias, Focal/Weakness. Psychiatric Denies complaints or symptoms of Anxiety, Claustrophobia. Objective Constitutional patient is hypertensive.. pulse regular and within target range for patient.Marland Kitchen respirations regular, non-labored and within target range for patient.Marland Kitchen temperature within target range for patient.. Well-nourished and well-hydrated in no acute distress. Vitals Time Taken: 1:10 PM, Height: 72 in, Source: Stated, Weight: 318 lbs, Source: Measured, BMI: 43.1, Temperature: 97.9 F, Pulse: 79 bpm, Respiratory Rate: 18 breaths/min, Blood Pressure: 179/65 mmHg. Eyes ARSHAD, OBERHOLZER ([id number]) conjunctiva clear no eyelid edema noted. pupils equal round and reactive to light and accommodation. Ears, Nose, Mouth, and Throat no gross abnormality of ear auricles or external auditory canals. normal hearing noted during conversation. mucus membranes moist. Respiratory normal breathing without difficulty. Cardiovascular 1+ dorsalis pedis/posterior tibialis pulses. stage 3 lymphedema. Musculoskeletal normal gait and posture. no significant deformity or arthritic changes, no loss or range of motion, no clubbing. Psychiatric this patient is able to make decisions and demonstrates good insight into disease process. Alert and Oriented x 3. pleasant and cooperative. General Notes: Upon evaluation today the patient's legs do not appear to be doing quite as well obviously as we would like to see. He does have significant edema noted bilaterally on the left he is weeping quite profusely. He does not have any obvious signs  of physical vascular compromise although his ABIs were noncompressible bilaterally. For  that reason I do think he is going to require arterial study with ABIs and TBI's to evaluate and ensure that he has appropriate blood flow. Integumentary (Hair, Skin) Wound #6 status is Open. Original cause of wound was Gradually Appeared. The wound is located on the Left,Circumferential Lower Leg. The wound measures 15cm length x 29cm width x 0.1cm depth; 341.648cm^2 area and 34.165cm^3 volume. There is Fat Layer (Subcutaneous Tissue) Exposed exposed. There is no tunneling or undermining noted. There is a large amount of serous drainage noted. The wound margin is indistinct and nonvisible. There is medium (34-66%) pink granulation within the wound bed. There is a medium (34-66%) amount of necrotic tissue within the wound bed including Adherent Slough. Wound #7 status is Open. Original cause of wound was Gradually Appeared. The wound is located on the Right,Posterior Lower Leg. The wound measures 3cm length x 4.5cm width x 0.1cm depth; 10.603cm^2 area and 1.06cm^3 volume. There is Fat Layer (Subcutaneous Tissue) Exposed exposed. There is no tunneling or undermining noted. There is a medium amount of serous drainage noted. The wound margin is flat and intact. There is small (1-33%) pink granulation within the wound bed. There is a large (67-100%) amount of necrotic tissue within the wound bed including Adherent Slough. Wound #8 status is Open. Original cause of wound was Gradually Appeared. The wound is located on the Right,Anterior Lower Leg. The wound measures 4cm length x 4cm width x 0.1cm depth; 12.566cm^2 area and 1.257cm^3 volume. There is Fat Layer (Subcutaneous Tissue) Exposed exposed. There is no tunneling or undermining noted. There is a medium amount of serous drainage noted. The wound margin is indistinct and nonvisible. There is small (1-33%) pink granulation within the wound bed. There is a large (67-100%) amount of necrotic tissue within the wound bed including Adherent  Slough. Assessment Active Problems ICD-10 Lymphedema, not elsewhere classified Non-pressure chronic ulcer of other part of right lower leg with fat layer exposed Non-pressure chronic ulcer of other part of left lower leg with fat layer exposed Type 2 diabetes mellitus with other skin ulcer Essential (primary) hypertension Procedures Wound #6 Pre-procedure diagnosis of Wound #6 is a Lymphedema located on the Left,Circumferential Lower Leg . There was a Selective/Open Wound Skin/Dermis Debridement with a total area of 25 sq cm performed by STONE III, Kassiah Mccrory E., PA-C. With the following instrument(s): Forceps, and Scissors to remove Viable and Non-Viable tissue/material. Material removed includes Blood Clots, Callus, and Skin: Dermis after achieving pain control using Lidocaine 2% Topical Gel. A time out was conducted at 13:00, prior to the start of the procedure. There was no bleeding. The procedure was tolerated well. Post Debridement Measurements: 15cm length x 29cm width x 0.1cm depth; 34.165cm^3 volume. Character of Wound/Ulcer Post Debridement is stable. NARCISO, [place]OUT[place]G ([id number]) Post procedure Diagnosis Wound #6: Same as Pre-Procedure General Notes: This was skin sloughing off of the heel and bottom of the patient's foot. Plan Wound Cleansing: Wound #6 Left,Circumferential Lower Leg: Clean wound with Normal Saline. Wound #7 Right,Posterior Lower Leg: Clean wound with Normal Saline. Wound #8 Right,Anterior Lower Leg: Clean wound with Normal Saline. Primary Wound Dressing: Wound #6 Left,Circumferential Lower Leg: XtraSorb Wound #7 Right,Posterior Lower Leg: XtraSorb Wound #8 Right,Anterior Lower Leg: XtraSorb Dressing Change Frequency: Wound #6 Left,Circumferential Lower Leg: Change Dressing Monday, Wednesday, Friday - Monday in office, Wednesday and Friday by Home health Wound #7 Right,Posterior Lower Leg: Change Dressing Monday, Wednesday, Friday -  Monday in office,  Wednesday and Friday by Home health Wound #8 Right,Anterior Lower Leg: Change Dressing Monday, Wednesday, Friday - Monday in office, Wednesday and Friday by Home health Follow-up Appointments: Wound #6 Left,Circumferential Lower Leg: Return Appointment in 1 week. Wound #7 Right,Posterior Lower Leg: Return Appointment in 1 week. Wound #8 Right,Anterior Lower Leg: Return Appointment in 1 week. Edema Control: Wound #6 Left,Circumferential Lower Leg: Unna Boots Bilaterally - Cut ABD pads to line along shin for protection while cutting off off unnaboots Wound #7 Right,Posterior Lower Leg: Unna Boots Bilaterally - Cut ABD pads to line along shin for protection while cutting off off unnaboots Wound #8 Right,Anterior Lower Leg: Unna Boots Bilaterally - Cut ABD pads to line along shin for protection while cutting off off unnaboots Home Health: Wound #6 Left,Circumferential Lower Leg: [place] Nurse may visit PRN to address patient s wound care needs. FACE TO FACE ENCOUNTER: MEDICARE and MEDICAID PATIENTS: I certify that this patient is under my care and that I had a face-to-face encounter that meets the physician face-to-face encounter requirements with this patient on this date. The encounter with the patient was in whole or in part for the following MEDICAL CONDITION: (primary reason for [place]) MEDICAL NECESSITY: I certify, that based on my findings, NURSING services are a medically necessary home health service. HOME BOUND STATUS: I certify that my clinical findings support that this patient is homebound (i.e., Due to illness or injury, pt requires aid of supportive devices such as crutches, cane, wheelchairs, walkers, the use of special transportation or the assistance of another person to leave their place of residence. There is a normal inability to leave the home and doing so requires considerable and taxing effort. Other absences are for  medical reasons / religious services and are infrequent or of short duration when for other reasons). If current dressing causes regression in wound condition, may D/C ordered dressing product/s and apply Normal Saline Moist Dressing daily until next [place] / Other MD appointment. [place] of regression in wound condition at [phone]. Please direct any NON-WOUND related issues/requests for orders to patient's Primary Care Physician Wound #7 Right,Posterior Lower Leg: [place] Visits - [place] Nurse may visit PRN to address patient s wound care needs. FACE TO FACE ENCOUNTER: MEDICARE and MEDICAID PATIENTS: I certify that this patient is under my care and that I had a face-to-face encounter that meets the physician face-to-face encounter requirements with this patient on this date. The encounter with the patient was in whole or in part for the following MEDICAL CONDITION: (primary reason for [place]) MEDICAL NECESSITY: I certify, that based on my findings, NURSING services are a medically necessary home health service. HOME BOUND STATUS: I certify that my clinical findings support that this patient is homebound (i.e., Due to illness or injury, pt requires aid of supportive devices such as crutches, cane, wheelchairs, walkers, the use of special transportation or the assistance of another person to leave their place of residence. There is a normal inability to leave the home and doing so requires considerable and taxing effort. Other absences are for medical reasons / religious services and are infrequent or of short duration when for other reasons). If current dressing causes regression in wound condition, may D/C ordered dressing product/s and apply Normal Saline Moist Dressing daily until next CLEVER, GERALDO ([id number]) [place] / Other MD appointment. [place]  of regression in wound condition at  [phone]. Please direct any NON-WOUND related issues/requests for orders to patient's Primary Care Physician Wound #8 Right,Anterior Lower Leg: [place] Nurse may visit PRN to address patient s wound care needs. FACE TO FACE ENCOUNTER: MEDICARE and MEDICAID PATIENTS: I certify that this patient is under my care and that I had a face-to-face encounter that meets the physician face-to-face encounter requirements with this patient on this date. The encounter with the patient was in whole or in part for the following MEDICAL CONDITION: (primary reason for [place]) MEDICAL NECESSITY: I certify, that based on my findings, NURSING services are a medically necessary home health service. HOME BOUND STATUS: I certify that my clinical findings support that this patient is homebound (i.e., Due to illness or injury, pt requires aid of supportive devices such as crutches, cane, wheelchairs, walkers, the use of special transportation or the assistance of another person to leave their place of residence. There is a normal inability to leave the home and doing so requires considerable and taxing effort. Other absences are for medical reasons / religious services and are infrequent or of short duration when for other reasons). If current dressing causes regression in wound condition, may D/C ordered dressing product/s and apply Normal Saline Moist Dressing daily until next [place] / Other MD appointment. [place] of regression in wound condition at [phone]. Please direct any NON-WOUND related issues/requests for orders to patient's Primary Care Physician 1. My suggestion at this time is good to me that we go ahead and initiate treatment with XtraSorb at this point to help with containing the fluid at this point especially on the left which is quite tremendous. I think that the alginate may just be too much as far as it soaking  through and then just sitting wet and moist on the surface of the skin which would not benefit him. 2. I would recommend that we continue with Unna boot wraps that been utilized up to this point he seems to have tolerated those without complication. With that being said I am going to send in for arterial studies with ABI and TBI to see whether or not a 3 or possibly even 4-layer compression could do much better for him to be much more effective. 3. Also get a suggest that we continue with having him elevate as much as possible right now I do not think he is good to be able to use his lymphedema pumps just secondary to the significant weeping and edema that he has but nonetheless in the future I think that something definitely to continue. 4. I do not believe were seeing anything that is evidence of infection at this point although if anything changes he does need to contact us and let me know as soon as possible. 5. He does have toenails which are somewhat digging into his toes especially on the left foot he typically sees Dr. Caryl Comes to have these trimmed I did recommend he contact them to go ahead and get this done as soon as possible. We do not want anything to worsen in that regard especially in light of his diabetes. We will see patient back for reevaluation in 1 week here in the clinic. If anything worsens or changes patient will contact our office for additional recommendations. Electronic Signature(s) Signed: 11/23/2019 2:20:09 PM By: Worthy Keeler PA-C Previous Signature: 11/23/2019 2:18:34 PM Version By: Worthy Keeler PA-C Entered  By: Worthy Keeler on 11/23/2019 14:20:09 Corey West ([id number]) -------------------------------------------------------------------------------- ROS/PFSH Details Patient Name: Corey West, Corey West A. Date of Service: 11/23/2019 1:15 PM Medical Record Number: [id number] Patient Account Number: [id number] Date of Birth/Sex: [birth year] (69 y.o. M) Treating RN:  Montey Hora Primary Care Provider: [name] Other Clinician: Referring Provider: Referral, Self Treating Provider/Extender: Melburn Hake, Nathania Waldman Weeks in Treatment: 0 Information Obtained From Patient Constitutional Symptoms (General Health) Complaints and Symptoms: Negative for: Fatigue; Fever; Chills; Marked Weight Change Medical History: Past Medical History Notes: high bood pressure, liver function decline, diabetic, Cirrhosis of liver Eyes Complaints and Symptoms: Negative for: Dry Eyes; Vision Changes; Glasses / Contacts Medical History: Positive for: Cataracts Negative for: Glaucoma; Optic Neuritis Ear/Nose/Mouth/Throat Complaints and Symptoms: Negative for: Difficult clearing ears; Sinusitis Medical History: Negative for: Chronic sinus problems/congestion; Middle ear problems Hematologic/Lymphatic Complaints and Symptoms: Negative for: Bleeding / Clotting Disorders; Human Immunodeficiency Virus Medical History: Positive for: Anemia Negative for: Hemophilia; Human Immunodeficiency Virus; Lymphedema; Sickle Cell Disease Respiratory Complaints and Symptoms: Negative for: Chronic or frequent coughs; Shortness of Breath Medical History: Positive for: Sleep Apnea Negative for: Aspiration; Asthma; Chronic Obstructive Pulmonary Disease (COPD); Pneumothorax; Tuberculosis Cardiovascular Complaints and Symptoms: Positive for: LE edema Negative for: Chest pain Medical History: Positive for: Hypertension Negative for: Angina; Arrhythmia; Congestive Heart Failure; Coronary Artery Disease; Deep Vein Thrombosis; Hypotension; Myocardial Infarction; Peripheral Arterial Disease; Peripheral Venous Disease; Phlebitis; Vasculitis Gastrointestinal Corey West, Corey West ([id number]) Complaints and Symptoms: Negative for: Frequent diarrhea; Nausea; Vomiting Medical History: Positive for: Cirrhosis Negative for: Colitis; Crohnos; Hepatitis A; Hepatitis B; Hepatitis  C Endocrine Complaints and Symptoms: Negative for: Hepatitis; Thyroid disease; Polydypsia (Excessive Thirst) Medical History: Positive for: Type II Diabetes Negative for: Type I Diabetes Time with diabetes: 10 years Treated with: Insulin Blood sugar tested every day: Yes Tested : Blood sugar testing results: Breakfast: 280 Genitourinary Complaints and Symptoms: Negative for: Kidney failure/ Dialysis; Incontinence/dribbling Medical History: Negative for: End Stage Renal Disease Past Medical History Notes: antibiotics for kidney infection Immunological Complaints and Symptoms: Negative for: Hives; Itching Medical History: Negative for: Lupus Erythematosus; Raynaudos; Scleroderma Integumentary (Skin) Complaints and Symptoms: Positive for: Wounds; Swelling Negative for: Bleeding or bruising tendency; Breakdown Medical History: Negative for: History of Burn; History of pressure wounds Musculoskeletal Complaints and Symptoms: Negative for: Muscle Pain; Muscle Weakness Medical History: Negative for: Gout; Rheumatoid Arthritis; Osteoarthritis; Osteomyelitis Neurologic Complaints and Symptoms: Negative for: Numbness/parasthesias; Focal/Weakness Medical History: Negative for: Dementia; Neuropathy; Quadriplegia; Paraplegia; Seizure Disorder Psychiatric Complaints and Symptoms: Negative for: Anxiety; Claustrophobia Medical History: Corey West, Corey West ([id number]) Negative for: Anorexia/bulimia; Confinement Anxiety Oncologic Medical History: Negative for: Received Chemotherapy; Received Radiation HBO Extended History Items Eyes: Cataracts Immunizations Pneumococcal Vaccine: Received Pneumococcal Vaccination: Yes Implantable Devices None Hospitalization / Surgery History Type of Hospitalization/Surgery passed out and fell Family and Social History Cancer: No; Diabetes: Yes - Mother; Heart Disease: Yes - Father; Hypertension: Yes - Mother; Kidney Disease: No; Lung Disease:  No; Seizures: Yes - Father; Stroke: No; Thyroid Problems: No; Light tobacco smoker; Alcohol Use: Never; Drug Use: No History; Caffeine Use: Moderate; Financial Concerns: No; Food, Clothing or Shelter Needs: No; Support System Lacking: No; Transportation Concerns: No Electronic Signature(s) Signed: 11/23/2019 4:53:18 PM By: Montey Hora Signed: 11/23/2019 5:29:33 PM By: Worthy Keeler PA-C Entered By: Montey Hora on 11/23/2019 13:20:17 Corey West ([id number]) -------------------------------------------------------------------------------- [facility] Details Patient Name: Corey West A. Date of Service: 11/23/2019 Medical Record Number: [id number] Patient Account Number: [id number] Date of Birth/Sex: 11-Jun-1951 (69 y.o. 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 E11.622 Type 2 diabetes mellitus with other skin ulcer I10 Essential (primary) hypertension Facility Procedures CPT4 Code: 74827078 Description: 99213 - WOUND CARE VISIT-LEV 3 EST PT Modifier: Quantity: 1 CPT4 Code: 67544920 Description: 10071 - DEBRIDE WOUND 1ST 20 SQ CM OR < Modifier: Quantity: 1 CPT4 Code: Description: ICD-10 Diagnosis Description L97.822 Non-pressure chronic ulcer of other part of left lower leg with fat layer expo Modifier: sed Quantity: CPT4 Code: 21975883 Description: 25498 - DEBRIDE WOUND EA ADDL 20 SQ CM Modifier: Quantity: 1 CPT4 Code: Description: ICD-10 Diagnosis Description L97.822 Non-pressure chronic ulcer of other part of left lower leg with fat layer expo Modifier:  sed Quantity: Physician Procedures CPT4 Code: 2641583 Description: 99214 - WC PHYS LEVEL 4 - EST PT Modifier: 25 Quantity: 1 CPT4 Code: Description: ICD-10 Diagnosis Description I89.0 Lymphedema, not elsewhere classified L97.812 Non-pressure chronic ulcer of other part of right lower leg with fat layer exp L97.822 Non-pressure chronic ulcer of other part of left lower leg with fat layer  expo E11.622 Type 2 diabetes mellitus with other skin ulcer Modifier: osed sed Quantity: CPT4 Code: 0940768 Description: 08811 - WC PHYS DEBR WO ANESTH 20 SQ CM Modifier: Quantity: 1 CPT4 Code: Description: ICD-10 Diagnosis Description L97.822 Non-pressure chronic ulcer of other part of left lower leg with fat layer expo Modifier: sed Quantity: CPT4 Code: 0315945 Description: 85929 - WC PHYS DEBR WO ANESTH EA ADD 20 CM Modifier: Quantity: 1 CPT4 Code: Description: ICD-10 Diagnosis Description L97.822 Non-pressure chronic ulcer of other part of left lower leg with fat layer expo Modifier: sed Quantity: Electronic Signature(s) Signed: 11/23/2019 2:20:37 PM By: Worthy Keeler PA-C Entered By: Worthy Keeler on 11/23/2019 14:20:37

## 2019-12-07 ENCOUNTER — Other Ambulatory Visit: Payer: Self-pay

## 2019-12-07 ENCOUNTER — Encounter: Payer: Medicare Other | Attending: Physician Assistant | Admitting: Physician Assistant

## 2019-12-07 DIAGNOSIS — I890 Lymphedema, not elsewhere classified: Secondary | ICD-10-CM | POA: Diagnosis not present

## 2019-12-07 DIAGNOSIS — E1122 Type 2 diabetes mellitus with diabetic chronic kidney disease: Secondary | ICD-10-CM | POA: Diagnosis not present

## 2019-12-07 DIAGNOSIS — Z794 Long term (current) use of insulin: Secondary | ICD-10-CM | POA: Diagnosis not present

## 2019-12-07 DIAGNOSIS — G4733 Obstructive sleep apnea (adult) (pediatric): Secondary | ICD-10-CM | POA: Insufficient documentation

## 2019-12-07 DIAGNOSIS — E1136 Type 2 diabetes mellitus with diabetic cataract: Secondary | ICD-10-CM | POA: Insufficient documentation

## 2019-12-07 DIAGNOSIS — L97822 Non-pressure chronic ulcer of other part of left lower leg with fat layer exposed: Secondary | ICD-10-CM | POA: Insufficient documentation

## 2019-12-07 DIAGNOSIS — F17290 Nicotine dependence, other tobacco product, uncomplicated: Secondary | ICD-10-CM | POA: Insufficient documentation

## 2019-12-07 DIAGNOSIS — N189 Chronic kidney disease, unspecified: Secondary | ICD-10-CM | POA: Diagnosis not present

## 2019-12-07 DIAGNOSIS — E11621 Type 2 diabetes mellitus with foot ulcer: Secondary | ICD-10-CM | POA: Diagnosis present

## 2019-12-07 DIAGNOSIS — K7460 Unspecified cirrhosis of liver: Secondary | ICD-10-CM | POA: Diagnosis not present

## 2019-12-07 DIAGNOSIS — L97812 Non-pressure chronic ulcer of other part of right lower leg with fat layer exposed: Secondary | ICD-10-CM | POA: Insufficient documentation

## 2019-12-07 DIAGNOSIS — E1165 Type 2 diabetes mellitus with hyperglycemia: Secondary | ICD-10-CM | POA: Diagnosis not present

## 2019-12-07 DIAGNOSIS — K2270 Barrett's esophagus without dysplasia: Secondary | ICD-10-CM | POA: Diagnosis not present

## 2019-12-07 DIAGNOSIS — I872 Venous insufficiency (chronic) (peripheral): Secondary | ICD-10-CM | POA: Diagnosis not present

## 2019-12-07 DIAGNOSIS — I509 Heart failure, unspecified: Secondary | ICD-10-CM | POA: Diagnosis not present

## 2019-12-07 DIAGNOSIS — Z6841 Body Mass Index (BMI) 40.0 and over, adult: Secondary | ICD-10-CM | POA: Diagnosis not present

## 2019-12-07 DIAGNOSIS — I130 Hypertensive heart and chronic kidney disease with heart failure and stage 1 through stage 4 chronic kidney disease, or unspecified chronic kidney disease: Secondary | ICD-10-CM | POA: Diagnosis not present

## 2019-12-07 DIAGNOSIS — D649 Anemia, unspecified: Secondary | ICD-10-CM | POA: Diagnosis not present

## 2019-12-07 NOTE — Progress Notes (Addendum)
SINA, LUCCHESI ([id number]) Visit Report for 12/07/2019 Chief Complaint Document Details Patient Name: Corey West, Corey West. Date of Service: 12/07/2019 12:45 PM Medical Record Number: [id number] Patient Account Number: [id number] Date of Birth/Sex: 09-Jul-1951 (69 y.o. M) Treating RN: Primary Care Provider: [name] Other Clinician: Referring Provider: [name] Treating Provider/Extender: Melburn Hake, Bastien Strawser Weeks in Treatment: 2 Information Obtained from: Patient Chief Complaint Bilateral LE Ulcers Electronic Signature(s) Signed: 12/07/2019 1:12:22 PM By: Worthy Keeler PA-C Entered By: Worthy Keeler on 12/07/2019 13:12:22 Corey West ([id number]) -------------------------------------------------------------------------------- HPI Details Patient Name: Corey West Date of Service: 12/07/2019 12:45 PM Medical Record Number: [id number] Patient Account Number: [id number] Date of Birth/Sex: 04-02-1951 (69 y.o. M) Treating RN: Primary Care Provider: [name] Other Clinician: Referring Provider: [name] Treating Provider/Extender: Melburn Hake, Breland Trouten Weeks in Treatment: 2 History of Present Illness HPI Description: Pleasant 69 year old with a past medical history significant for morbid obesity, insulin-dependent type 2 diabetes (hemoglobin A1c 7.2 in January 2015), cirrhosis, and chronic kidney disease. He reports a left anterior calf ulcer since January 2015. He has had venous stasis ulcers in the past, which have healed spontaneously. No history of DVT. Does not regularly wear compression. Ambulating normally per his baseline. Recently completed a course of antibiotics. Treated with a Profore light and Coban 2 layer compression drainage, which both fell down and he cut off. Tolerated Coban lite compression bandage. Returns to clinic for followup. No complaints today. No pain at this time. No fever or chills. No open wounds at this time. He is having difficulty finding compression  garments that fit him. 04/23/17; this is now 69 year old man who returns to our clinic for nonhealing wounds on the right lateral and right posterior calf. He tells me that they have been present for 6 months. In the setting of severe right greater than left lower extremity lymphedema likely secondary to chronic venous insufficiency. He has been followed in the lymphedema clinic at least since early June with compression wraps although these have not really helped and the patient is here for our review of this situation. The patient has had problems with edema in his legs for years. He has not worn compression. He is type II diabetic that sounds poorly controlled. He also has obstructive sleep apnea and cirrhosis. He is occasional cigar smoker 04/30/17; patient is a 69 year old man who has severe bilateral lower extremity lymphedema stage West since his early 39s he tells me. This is no doubt secondary to chronic venous insufficiency. He was going to the lymphedema clinic who forwarded him here due to skin breakdown in the right leg. He has not previously worn compression stockings. He has a large area of denuded epithelium on the posterior leg. He has a more open wound on the right lateral leg 05/07/17 on evaluation today patient appears to be doing about the same his wounds are showing signs of improvement and looking fairly well. With that being said he is not having any severe discomfort just some mild aching due to the swelling and really this is worse on the left than the right and the right side is where he actually has his wounds. He has been tolerating the [employer] wraps without complication and states this is actually much better than the wraps that he was getting at the lymphedema clinic most recently. He is happy with these wraps. He has no nausea, vomiting, diarrhea and no fevers or chills. We are still waiting to hear back in regard to  the lymphedema pumps which I do think would be  beneficial for him even with the compression wraps he still has a significant amount of edema noted of the bilateral lower extremities. 05/15/17 on evaluation today patient's right lower extremity edema and weeping especially the posterior portion appears to be doing better. He still is having some issue here but fortunately it's minimal compared to previous weeks. He continues to tolerate the compression fairly well. Overall he is pleased with how things are progressing. No fevers, chills, nausea, or vomiting noted at this time. And patient is having only minimal discomfort. 05/22/17; the medial wound on the right leg actually looks considerably better and is epithelialized. He still has a small punched out area on the right lateral leg. Edema control is marginal end of the [employer]. We are working on getting him compression pumps 05/29/17; the patient's lymphedema pumps are apparently arriving tonight. In the meantime he took his Unna boots off last Sunday because the edema had progressed to the point that a word to titrate. Since then he has put nothing on his right leg and more specifically nothing on the right medial and lateral wounds 06/05/17; the patient has his lymphedema pumps and he is using them once a day on the right leg. There is a sizable reduction in the circumference of his leg although he arrived in clinic today somewhat short of breath. 07/02/17; since the patient was last in clinic he was admitted on 2 occasions. He was admitted from 9/12 through 9/13 with lactic acidosis weakness and felt to have left lower extremity cellulitis. Due to the elevated lactic acid level he was felt to have sepsis although this was ruled out and the lactic acidosis was felt secondary to his chronic cirrhosis. He was initially treated with IV vancomycin for cellulitis and redness of his left leg but then this was subsequently recognized to be secondary to chronic venous stasis and chronic lymphedema. When  he left the hospital he had Unna boots placed bilaterally. He was readmitted on 9/26 through 9/28 with GI bleeding. His EGD showed Barrett's esophagus and gastritis. He was discharged with a hemoglobin of 8 to follow-up with GI as an outpatient. There was nothing more about his lower extremities during this short hospital stay. The patient has not been using his external compression pumps fearing fluid overload and shortness of breath although he feels better now that his hemoglobin is stabilized. He continues to have epithelial loss on his bilateral lower extremities. 07/09/17; patient has a large but superficial area on the right posterior leg and on the left anterior leg. He tells me he is been using his external compression pumps once a day for the required hour. He states the pumps hurt his right ankle. 07/17/17; patient was transfused a further 2 units since he was here the last time. His hemoglobin was once again found to be low. He has not been able to use his external compression pumps because of a combination of prolonged power outage in his home and I think issues with his other medical care. His wounds have actually worsened he now has loss of epithelialization all the way from the right lateral to the right medial posteriorly. The left side which has anterior loss of epithelialization also appears to be worse on the lateral aspect of the leg where there is denuded epithelium now as well. Leaking edema fluid. He has bilateral lymphedema. 08/12/2017 -- the patient has not been here for 25 days and has  had the same compression wraps which have not been changed!! He says he was being worked up for a GI bleed and had some transfusions since he was seen last by Dr. Dellia Nims 08/20/17; mostly by review of what the patient is saying and a quick glance at his records in care everywhere I think this patient probably has Edoardo, Laforte, Poyen A. ([id number]) cirrhosis and portal hypertension. He is bleeding  I think from gastric area and he is requiring recurrent transfusions. He has been told that there is nothing more that can be done and that his prognosis is poor. He arrived in our office in our office in tears. We've been using silver alginate and 3 layer compression to his bilateral lower extremity wounds. He has significant lymphedema likely secondary to chronic venous insufficiency. He has external compression pumps at home that he had actually before he came to this clinic but he is not using 08/27/17; patient is doing well, wounds on his left leg are tiny one anteriorly and one laterally. He still has substantial wound on the posterior aspect of his right calf extending laterally for over this looks better than last time. He tells me he is able to use his compression pumps once a day on most days. He has his hemoglobin checked on Friday 09-19-17 He arrives today for follow up evaluation. He was unable to come to the clinic last week r/t weather conditions, therefore compression has been in place for two weeks. He presents today with healed LLE and one partial thickness open area to the posterior RL. He states he is experiencing diarrhea secondary to antibiotic that will be stopped next week and has been unable to use lymphedema pumps. Will continue with compression bilaterally, follow up next week with plans to discharge and transition to lymphedema pumps 10/02/17; the patient arrives today with his juxta light stockings bilaterally. He also has external compression pumps at home. There are no open areas on his bilateral legs. He tells me he has had adjustments in his diuretics and had resultant hypokalemia but the amount of edema in his legs is a lot better although he stillhas changes of lymphedema 10/16/17; the patient has been using his juxta lites although they are not put on as high as we might like. He has very superficial excoriations at the top of his wraps but I don't think that anything  to keep him in clinic here. Severe bilateral chronic venous insufficiency with secondary lymphedema. He has no open wound areas Readmission: 11/23/2019 patient presents today for readmission he was last seen here in the clinic on 10/16/2017. With that being said he is unfortunately continue to have issues with lower extremity edema though he was using his lymphedema pumps on a fairly regular basis. Fortunately there is no signs of infection at this time which is good news there is a little bit of erythema that have me wondering about the possibility of infection but again I think this may just be secondary to the inflammation from the lymphedema and the current weeping causing skin breakdown as well. His plantar aspect of his foot on the left lower extremity actually is sloughing off a lot of callus skin externally there is nothing open under this area but nonetheless it does seem to be causing him some difficulty. Fortunately there is no signs of active infection at this time. No fevers, chills, nausea, vomiting, or diarrhea. The patient does have a history of diabetes mellitus type 2, hypertension, and lymphedema. 12/07/2019 upon  evaluation today patient appears to be doing well with regard to the overall appearance of his lower extremities currently. He has been tolerating the dressing changes without complication. Fortunately his infection seems to be doing much better at this point. He has made excellent progress. With that being said there are no signs of active infection systemically at this point. Electronic Signature(s) Signed: 12/07/2019 1:24:30 PM By: Worthy Keeler PA-C Entered By: Worthy Keeler on 12/07/2019 13:24:30 Corey West ([id number]) -------------------------------------------------------------------------------- Physical Exam Details Patient Name: Corey West, Corey West. Date of Service: 12/07/2019 12:45 PM Medical Record Number: [id number] Patient Account Number: [id number] Date of  Birth/Sex: January 28, 1951 (69 y.o. M) Treating RN: Primary Care Provider: [name] Other Clinician: Referring Provider: [name] Treating Provider/Extender: Corey West, Corey West Weeks in Treatment: 2 Constitutional Obese and well-hydrated in no acute distress. Respiratory normal breathing without difficulty. Psychiatric this patient is able to make decisions and demonstrates good insight into disease process. Alert and Oriented x 3. pleasant and cooperative. Notes Upon inspection patient's wounds actually appear to be as best I can tell pretty much healed. With that being said I am concerned about the fact that he continues to experience issues with lymphedema. He tells me he cannot get compression socks on that is can be somewhat of a problem. Nonetheless being that he just healed and I want to make sure things stay such doing well I am going to recommend wrapping for at least 1 more week. It may be longer than that on the left we will just see how things appear next week. Fortunately there is no signs overall of obvious open wounds. Electronic Signature(s) Signed: 12/07/2019 2:38:25 PM By: Worthy Keeler PA-C Entered By: Worthy Keeler on 12/07/2019 14:38:24 Corey West ([id number]) -------------------------------------------------------------------------------- Physician Orders Details Patient Name: Corey West, Corey A. Date of Service: 12/07/2019 12:45 PM Medical Record Number: [id number] Patient Account Number: [id number] Date of Birth/Sex: October 17, 1950 (69 y.o. M) Treating RN: Army Melia Primary Care Provider: [name] Other Clinician: Referring Provider: [name] Treating Provider/Extender: Melburn Hake, Durant Scibilia Weeks in Treatment: 2 Verbal / Phone Orders: No Diagnosis Coding ICD-10 Coding Code Description I89.0 Lymphedema, not elsewhere classified L97.812 Non-pressure chronic ulcer of other part of right lower leg with fat layer exposed L97.822 Non-pressure chronic ulcer of  other part of left lower leg with fat layer exposed E11.622 Type 2 diabetes mellitus with other skin ulcer I10 Essential (primary) hypertension Wound Cleansing o Cleanse wound with mild soap and water Dressing Change Frequency o Change Dressing Monday, Wednesday, Friday - Monday in office, Wednesday and Friday by [facility] Follow-up Appointments o Return Appointment in 1 week. Edema Control o Unna Boots Bilaterally - ABD pads up shin for protection [facility] [place] Nurse may visit PRN to address patientos wound care needs. o FACE TO FACE ENCOUNTER: MEDICARE and MEDICAID PATIENTS: I certify that this patient is under my care and that I had a face-to- face encounter that meets the physician face-to-face encounter requirements with this patient on this date. The encounter with the patient was in whole or in part for the following MEDICAL CONDITION: (primary reason for [place]) MEDICAL NECESSITY: I certify, that based on my findings, NURSING services are a medically necessary home health service. HOME BOUND STATUS: I certify that my clinical findings support that this patient is homebound (i.e., Due to illness or injury, pt requires aid of supportive devices such as crutches, cane, wheelchairs, walkers,  the use of special transportation or the assistance of another person to leave their place of residence. There is a normal inability to leave the home and doing so requires considerable and taxing effort. Other absences are for medical reasons / religious services and are infrequent or of short duration when for other reasons). o If current dressing causes regression in wound condition, may D/C ordered dressing product/s and apply Normal Saline Moist Dressing daily until next [place] / Other MD appointment. [place] of regression in wound condition at [phone]. o Please direct any NON-WOUND  related issues/requests for orders to patient's Primary Care Physician Electronic Signature(s) Signed: 12/07/2019 4:58:25 PM By: Army Melia Signed: 12/08/2019 12:55:39 AM By: Worthy Keeler PA-C Entered By: Army Melia on 12/07/2019 13:21:14 Corey West ([id number]) -------------------------------------------------------------------------------- Problem List Details Patient Name: Corey West, Corey A. Date of Service: 12/07/2019 12:45 PM Medical Record Number: [id number] Patient Account Number: [id number] Date of Birth/Sex: 1951-04-19 (69 y.o. M) Treating RN: Primary Care Provider: [name] Other Clinician: Referring Provider: [name] Treating Provider/Extender: Melburn Hake, Bonny Vanleeuwen Weeks in Treatment: 2 Active Problems ICD-10 Evaluated Encounter Code Description Active Date Today Diagnosis I89.0 Lymphedema, not elsewhere classified 11/23/2019 No Yes L97.812 Non-pressure chronic ulcer of other part of right lower leg with fat layer 11/23/2019 No Yes exposed L97.822 Non-pressure chronic ulcer of other part of left lower leg with fat layer 11/23/2019 No Yes exposed E11.622 Type 2 diabetes mellitus with other skin ulcer 11/23/2019 No Yes I10 Essential (primary) hypertension 11/23/2019 No Yes Inactive Problems Resolved Problems Electronic Signature(s) Signed: 12/07/2019 12:57:29 PM By: Worthy Keeler PA-C Entered By: Worthy Keeler on 12/07/2019 12:57:29 Corey West ([id number]) -------------------------------------------------------------------------------- Progress Note Details Patient Name: Corey West Date of Service: 12/07/2019 12:45 PM Medical Record Number: [id number] Patient Account Number: [id number] Date of Birth/Sex: Feb 24, 1951 (69 y.o. M) Treating RN: Primary Care Provider: [name] Other Clinician: Referring Provider: [name] Treating Provider/Extender: Melburn Hake, Kadyn Chovan Weeks in Treatment: 2 Subjective Chief Complaint Information obtained from  Patient Bilateral LE Ulcers History of Present Illness (HPI) Pleasant 69 year old with a past medical history significant for morbid obesity, insulin-dependent type 2 diabetes (hemoglobin A1c 7.2 in January 2015), cirrhosis, and chronic kidney disease. He reports a left anterior calf ulcer since January 2015. He has had venous stasis ulcers in the past, which have healed spontaneously. No history of DVT. Does not regularly wear compression. Ambulating normally per his baseline. Recently completed a course of antibiotics. Treated with a Profore light and Coban 2 layer compression drainage, which both fell down and he cut off. Tolerated Coban lite compression bandage. Returns to clinic for followup. No complaints today. No pain at this time. No fever or chills. No open wounds at this time. He is having difficulty finding compression garments that fit him. 04/23/17; this is now 69 year old man who returns to our clinic for nonhealing wounds on the right lateral and right posterior calf. He tells me that they have been present for 6 months. In the setting of severe right greater than left lower extremity lymphedema likely secondary to chronic venous insufficiency. He has been followed in the lymphedema clinic at least since early June with compression wraps although these have not really helped and the patient is here for our review of this situation. The patient has had problems with edema in his legs for years. He has not worn compression. He is type II diabetic that sounds poorly controlled. He also has obstructive  sleep apnea and cirrhosis. He is occasional cigar smoker 04/30/17; patient is a 69 year old man who has severe bilateral lower extremity lymphedema stage West since his early 77s he tells me. This is no doubt secondary to chronic venous insufficiency. He was going to the lymphedema clinic who forwarded him here due to skin breakdown in the right leg. He has not previously worn compression  stockings. He has a large area of denuded epithelium on the posterior leg. He has a more open wound on the right lateral leg 05/07/17 on evaluation today patient appears to be doing about the same his wounds are showing signs of improvement and looking fairly well. With that being said he is not having any severe discomfort just some mild aching due to the swelling and really this is worse on the left than the right and the right side is where he actually has his wounds. He has been tolerating the [employer] wraps without complication and states this is actually much better than the wraps that he was getting at the lymphedema clinic most recently. He is happy with these wraps. He has no nausea, vomiting, diarrhea and no fevers or chills. We are still waiting to hear back in regard to the lymphedema pumps which I do think would be beneficial for him even with the compression wraps he still has a significant amount of edema noted of the bilateral lower extremities. 05/15/17 on evaluation today patient's right lower extremity edema and weeping especially the posterior portion appears to be doing better. He still is having some issue here but fortunately it's minimal compared to previous weeks. He continues to tolerate the compression fairly well. Overall he is pleased with how things are progressing. No fevers, chills, nausea, or vomiting noted at this time. And patient is having only minimal discomfort. 05/22/17; the medial wound on the right leg actually looks considerably better and is epithelialized. He still has a small punched out area on the right lateral leg. Edema control is marginal end of the [employer]. We are working on getting him compression pumps 05/29/17; the patient's lymphedema pumps are apparently arriving tonight. In the meantime he took his Unna boots off last Sunday because the edema had progressed to the point that a word to titrate. Since then he has put nothing on his right leg and more  specifically nothing on the right medial and lateral wounds 06/05/17; the patient has his lymphedema pumps and he is using them once a day on the right leg. There is a sizable reduction in the circumference of his leg although he arrived in clinic today somewhat short of breath. 07/02/17; since the patient was last in clinic he was admitted on 2 occasions. He was admitted from 9/12 through 9/13 with lactic acidosis weakness and felt to have left lower extremity cellulitis. Due to the elevated lactic acid level he was felt to have sepsis although this was ruled out and the lactic acidosis was felt secondary to his chronic cirrhosis. He was initially treated with IV vancomycin for cellulitis and redness of his left leg but then this was subsequently recognized to be secondary to chronic venous stasis and chronic lymphedema. When he left the hospital he had Unna boots placed bilaterally. He was readmitted on 9/26 through 9/28 with GI bleeding. His EGD showed Barrett's esophagus and gastritis. He was discharged with a hemoglobin of 8 to follow-up with GI as an outpatient. There was nothing more about his lower extremities during this short hospital stay.  The patient has not been using his external compression pumps fearing fluid overload and shortness of breath although he feels better now that his hemoglobin is stabilized. He continues to have epithelial loss on his bilateral lower extremities. 07/09/17; patient has a large but superficial area on the right posterior leg and on the left anterior leg. He tells me he is been using his external compression pumps once a day for the required hour. He states the pumps hurt his right ankle. 07/17/17; patient was transfused a further 2 units since he was here the last time. His hemoglobin was once again found to be low. He has not been able to use his external compression pumps because of a combination of prolonged power outage in his home and I think issues with  his other medical care. His wounds have actually worsened he now has loss of epithelialization all the way from the right lateral to the right medial posteriorly. The left side which has anterior loss of epithelialization also appears to be worse on the lateral aspect of the leg where there is denuded epithelium now as Corey West, Corey West ([id number]) well. Leaking edema fluid. He has bilateral lymphedema. 08/12/2017 -- the patient has not been here for 25 days and has had the same compression wraps which have not been changed!! He says he was being worked up for a GI bleed and had some transfusions since he was seen last by Dr. Dellia Nims 08/20/17; mostly by review of what the patient is saying and a quick glance at his records in care everywhere I think this patient probably has Karlene Lineman, cirrhosis and portal hypertension. He is bleeding I think from gastric area and he is requiring recurrent transfusions. He has been told that there is nothing more that can be done and that his prognosis is poor. He arrived in our office in our office in tears. We've been using silver alginate and 3 layer compression to his bilateral lower extremity wounds. He has significant lymphedema likely secondary to chronic venous insufficiency. He has external compression pumps at home that he had actually before he came to this clinic but he is not using 08/27/17; patient is doing well, wounds on his left leg are tiny one anteriorly and one laterally. He still has substantial wound on the posterior aspect of his right calf extending laterally for over this looks better than last time. He tells me he is able to use his compression pumps once a day on most days. He has his hemoglobin checked on Friday 09-19-17 He arrives today for follow up evaluation. He was unable to come to the clinic last week r/t weather conditions, therefore compression has been in place for two weeks. He presents today with healed LLE and one partial thickness  open area to the posterior RL. He states he is experiencing diarrhea secondary to antibiotic that will be stopped next week and has been unable to use lymphedema pumps. Will continue with compression bilaterally, follow up next week with plans to discharge and transition to lymphedema pumps 10/02/17; the patient arrives today with his juxta light stockings bilaterally. He also has external compression pumps at home. There are no open areas on his bilateral legs. He tells me he has had adjustments in his diuretics and had resultant hypokalemia but the amount of edema in his legs is a lot better although he stillhas changes of lymphedema 10/16/17; the patient has been using his juxta lites although they are not put on as high as we  might like. He has very superficial excoriations at the top of his wraps but I don't think that anything to keep him in clinic here. Severe bilateral chronic venous insufficiency with secondary lymphedema. He has no open wound areas Readmission: 11/23/2019 patient presents today for readmission he was last seen here in the clinic on 10/16/2017. With that being said he is unfortunately continue to have issues with lower extremity edema though he was using his lymphedema pumps on a fairly regular basis. Fortunately there is no signs of infection at this time which is good news there is a little bit of erythema that have me wondering about the possibility of infection but again I think this may just be secondary to the inflammation from the lymphedema and the current weeping causing skin breakdown as well. His plantar aspect of his foot on the left lower extremity actually is sloughing off a lot of callus skin externally there is nothing open under this area but nonetheless it does seem to be causing him some difficulty. Fortunately there is no signs of active infection at this time. No fevers, chills, nausea, vomiting, or diarrhea. The patient does have a history of diabetes  mellitus type 2, hypertension, and lymphedema. 12/07/2019 upon evaluation today patient appears to be doing well with regard to the overall appearance of his lower extremities currently. He has been tolerating the dressing changes without complication. Fortunately his infection seems to be doing much better at this point. He has made excellent progress. With that being said there are no signs of active infection systemically at this point. Objective Constitutional Obese and well-hydrated in no acute distress. Vitals Time Taken: 9:20 AM, Height: 72 in, Weight: 318 lbs, BMI: 43.1, Temperature: 98.1 F, Pulse: 86 bpm, Respiratory Rate: 18 breaths/min, Blood Pressure: 172/66 mmHg. Respiratory normal breathing without difficulty. Psychiatric this patient is able to make decisions and demonstrates good insight into disease process. Alert and Oriented x 3. pleasant and cooperative. General Notes: Upon inspection patient's wounds actually appear to be as best I can tell pretty much healed. With that being said I am concerned about the fact that he continues to experience issues with lymphedema. He tells me he cannot get compression socks on that is can be somewhat of a problem. Nonetheless being that he just healed and I want to make sure things stay such doing well I am going to recommend wrapping for at least 1 more week. It may be longer than that on the left we will just see how things appear next week. Fortunately there is no signs overall of obvious open wounds. Integumentary (Hair, Skin) Wound #6 status is Healed - Epithelialized. Original cause of wound was Gradually Appeared. The wound is located on the Left,Circumferential Lower Leg. The wound measures 0cm length x 0cm width x 0cm depth; 0cm^2 area and 0cm^3 volume. Corey West, Corey West ([id number]) Wound #7 status is Healed - Epithelialized. Original cause of wound was Gradually Appeared. The wound is located on the Right,Posterior Lower Leg. The  wound measures 0cm length x 0cm width x 0cm depth; 0cm^2 area and 0cm^3 volume. Wound #8 status is Healed - Epithelialized. Original cause of wound was Gradually Appeared. The wound is located on the Right,Anterior Lower Leg. The wound measures 0cm length x 0cm width x 0cm depth; 0cm^2 area and 0cm^3 volume. Other Condition(s) Patient presents with Lymphedema located on the Bilateral Leg. Assessment Active Problems ICD-10 Lymphedema, not elsewhere classified Non-pressure chronic ulcer of other part of right lower leg with  fat layer exposed Non-pressure chronic ulcer of other part of left lower leg with fat layer exposed Type 2 diabetes mellitus with other skin ulcer Essential (primary) hypertension Procedures There was a Haematologist Compression Therapy Procedure by Army Melia, RN. Post procedure Diagnosis Wound #: Same as Pre-Procedure Plan Wound Cleansing: Cleanse wound with mild soap and water Dressing Change Frequency: Change Dressing Monday, Wednesday, Friday - Monday in office, Wednesday and Friday by [facility] Follow-up Appointments: Return Appointment in 1 week. Edema Control: Unna Boots Bilaterally - ABD pads up shin for protection Home Health: [place] Nurse may visit PRN to address patient s wound care needs. FACE TO FACE ENCOUNTER: MEDICARE and MEDICAID PATIENTS: I certify that this patient is under my care and that I had a face-to-face encounter that meets the physician face-to-face encounter requirements with this patient on this date. The encounter with the patient was in whole or in part for the following MEDICAL CONDITION: (primary reason for [place]) MEDICAL NECESSITY: I certify, that based on my findings, NURSING services are a medically necessary home health service. HOME BOUND STATUS: I certify that my clinical findings support that this patient is homebound (i.e., Due to illness or injury, pt requires aid of supportive  devices such as crutches, cane, wheelchairs, walkers, the use of special transportation or the assistance of another person to leave their place of residence. There is a normal inability to leave the home and doing so requires considerable and taxing effort. Other absences are for medical reasons / religious services and are infrequent or of short duration when for other reasons). If current dressing causes regression in wound condition, may D/C ordered dressing product/s and apply Normal Saline Moist Dressing daily until next [place] / Other MD appointment. Clive of regression in wound condition at [phone]. Please direct any NON-WOUND related issues/requests for orders to patient's Primary Care Physician 1. I would recommend currently that we continue with the current wound care measures which includes a the Unna boot wrap the patient has done well at this point. I do think a 3 layer compression will be better though it is going to have to be seen by vascular to ensure good arterial flow before we initiate that. Corey West, Corey West ([id number]) 2. I am also can recommend he continue to elevate his legs and use his pumps as much as possible I think that something he can go ahead and do at this point. 3. I am also going to recommend that we continue with the alginate to the open weeping areas on really not sure that we have anything currently that is obviously open therefore we can just use ABD pads anywhere that may be suspicious. We will see patient back for reevaluation in 1 week here in the clinic. If anything worsens or changes patient will contact our office for additional recommendations. Electronic Signature(s) Signed: 12/07/2019 2:39:41 PM By: Worthy Keeler PA-C Entered By: Worthy Keeler on 12/07/2019 14:39:41 Corey West ([id number]) -------------------------------------------------------------------------------- [place] Details Patient Name:  Corey West, Corey A. Date of Service: 12/07/2019 Medical Record Number: [id number] Patient Account Number: [id number] Date of Birth/Sex: 05/06/1951 (69 y.o. M) Treating RN: Primary Care Provider: [name] Other Clinician: Referring Provider: [name] Treating Provider/Extender: Melburn Hake, Leticia Mcdiarmid Weeks in Treatment: 2 Diagnosis Coding ICD-10 Codes Code Description I89.0 Lymphedema, not elsewhere classified L97.812 Non-pressure chronic ulcer of other part of right lower leg with fat layer exposed L97.822 Non-pressure  chronic ulcer of other part of left lower leg with fat layer exposed E11.622 Type 2 diabetes mellitus with other skin ulcer I10 Essential (primary) hypertension Physician Procedures CPT4 Code: [id number] Description: 14604 - WC PHYS LEVEL 3 - EST PT Modifier: Quantity: 1 CPT4 Code: Description: ICD-10 Diagnosis Description I89.0 Lymphedema, not elsewhere classified L97.812 Non-pressure chronic ulcer of other part of right lower leg with fat layer L97.822 Non-pressure chronic ulcer of other part of left lower leg with fat layer e  N99.872 Type 2 diabetes mellitus with other skin ulcer Modifier: exposed xposed Quantity: Electronic Signature(s) Signed: 12/07/2019 2:39:55 PM By: Worthy Keeler PA-C Entered By: Worthy Keeler on 12/07/2019 14:39:54

## 2019-12-08 NOTE — Progress Notes (Addendum)
SANTOSH, PETTER ([id number]) Visit Report for 12/07/2019 Arrival Information Details Patient Name: Corey West, Corey West. Date of Service: 12/07/2019 12:45 PM Medical Record Number: [id number] Patient Account Number: [id number] Date of Birth/Sex: [birth year] (69 y.o. M) Treating RN: Cornell Barman Primary Care Alaisha Eversley: [name] Other Clinician: Referring Kymari Nuon: [name] Treating Caddie Randle/Extender: Melburn Hake, HOYT Weeks in Treatment: 2 Visit Information History Since Last Visit Added or deleted any medications: Yes Patient Arrived: Walker Pain Present Now: No Arrival Time: 12:54 Accompanied By: self Transfer Assistance: None Patient Identification Verified: Yes Secondary Verification Process Completed: Yes Electronic Signature(s) Signed: 12/07/2019 5:15:52 PM By: Gretta Cool, BSN, RN, CWS, Kim RN, BSN Entered By: Gretta Cool, BSN, RN, CWS, Kim on 12/07/2019 12:55:03 [name] ([id number]) -------------------------------------------------------------------------------- Compression Therapy Details Patient Name: MARZELL, ISAKSON A. Date of Service: 12/07/2019 12:45 PM Medical Record Number: [id number] Patient Account Number: [id number] Date of Birth/Sex: December 11, 1950 (69 y.o. M) Treating RN: Primary Care Elmer Boutelle: [name] Other Clinician: Referring Tawanda Schall: [name] Treating May Ozment/Extender: Melburn Hake, HOYT Weeks in Treatment: 2 Compression Therapy Performed for Wound Assessment: NonWound Condition Lymphedema - Bilateral Leg Performed By: Clinician Army Melia, RN Compression Type: Rolena Infante Post Procedure Diagnosis Same as Pre-procedure Electronic Signature(s) Signed: 12/08/2019 11:12:27 AM By: Sharon Mt Previous Signature: 12/07/2019 4:58:25 PM Version By: Army Melia Entered By: Sharon Mt on 12/08/2019 11:12:25 [name] ([id number]) -------------------------------------------------------------------------------- Encounter Discharge Information Details Patient Name: Corey West, Corey  A. Date of Service: 12/07/2019 12:45 PM Medical Record Number: [id number] Patient Account Number: [id number] Date of Birth/Sex: [birth year] (69 y.o. M) Treating RN: Army Melia Primary Care Dain Laseter: [name] Other Clinician: Referring Iosefa Weintraub: [name] Treating Ashly Goethe/Extender: Melburn Hake, HOYT Weeks in Treatment: 2 Encounter Discharge Information Items Discharge Condition: Stable Ambulatory Status: Walker Discharge Destination: Home Transportation: Private Auto Accompanied By: self Schedule Follow-up Appointment: Yes Clinical Summary of Care: Electronic Signature(s) Signed: 12/07/2019 4:58:25 PM By: Army Melia Entered By: Army Melia on 12/07/2019 13:22:21 [name] ([id number]) -------------------------------------------------------------------------------- Lower Extremity Assessment Details Patient Name: Corey West, Corey A. Date of Service: 12/07/2019 12:45 PM Medical Record Number: [id number] Patient Account Number: [id number] Date of Birth/Sex: [birth year] (69 y.o. M) Treating RN: Cornell Barman Primary Care Ossie Yebra: [name] Other Clinician: Referring Christyann Manolis: [name] Treating Velina Drollinger/Extender: Melburn Hake, HOYT Weeks in Treatment: 2 Edema Assessment Assessed: [Left: No] [Right: No] Edema: [Left: Yes] [Right: Yes] Calf Left: Right: Point of Measurement: 32 cm From Medial Instep 42 cm 44.3 cm Ankle Left: Right: Point of Measurement: 12 cm From Medial Instep 27.5 cm 28.5 cm Vascular Assessment Pulses: Dorsalis Pedis Palpable: [Left:Yes] [Right:Yes] Electronic Signature(s) Signed: 12/07/2019 5:15:52 PM By: Gretta Cool, BSN, RN, CWS, Kim RN, BSN Entered By: Gretta Cool, BSN, RN, CWS, Kim on 12/07/2019 13:03:23 [name] ([id number]) -------------------------------------------------------------------------------- Multi Wound Chart Details Patient Name: Corey Cower A. Date of Service: 12/07/2019 12:45 PM Medical Record Number: [id number] Patient Account Number:  [id number] Date of Birth/Sex: Nov 16, 1950 (69 y.o. M) Treating RN: Army Melia Primary Care Xzander Gilham: [name] Other Clinician: Referring Britanee Vanblarcom: [name] Treating Aldene Hendon/Extender: Melburn Hake, HOYT Weeks in Treatment: 2 Vital Signs Height(in): 72 Pulse(bpm): 21 Weight(lbs): 318 Blood Pressure(mmHg): 172/66 Body Mass Index(BMI): 43 Temperature(F): 98.1 Respiratory Rate(breaths/min): 18 Photos: Wound Location: Left, Circumferential Lower Leg Right, Posterior Lower Leg Right, Anterior Lower Leg Wounding Event: Gradually Appeared Gradually Appeared Gradually Appeared Primary Etiology: Lymphedema Lymphedema Lymphedema Date Acquired: 11/15/2019 11/15/2019 11/15/2019 Weeks of Treatment: 2 2 2  Wound Status: Healed - Epithelialized Healed - Epithelialized Healed - Epithelialized Measurements L x  W x D (cm) 0x0x0 0x0x0 0x0x0 Area (cm) : 0 0 0 Volume (cm) : 0 0 0 % Reduction in Area: 100.00% 100.00% 100.00% % Reduction in Volume: 100.00% 100.00% 100.00% Classification: Full Thickness Without Exposed Full Thickness Without Exposed Full Thickness Without Exposed Support Structures Support Structures Support Structures Treatment Notes Electronic Signature(s) Signed: 12/07/2019 4:58:25 PM By: Army Melia Entered By: Army Melia on 12/07/2019 13:16:03 [name] ([id number]) -------------------------------------------------------------------------------- [place] Details Patient Name: Corey West, Corey A. Date of Service: 12/07/2019 12:45 PM Medical Record Number: [id number] Patient Account Number: [id number] Date of Birth/Sex: October 29, 1950 (69 y.o. M) Treating RN: Army Melia Primary Care Olivya Sobol: [name] Other Clinician: Referring Mitchelle Sultan: [name] Treating Aariz Maish/Extender: Melburn Hake, HOYT Weeks in Treatment: 2 Active Inactive Nutrition Nursing Diagnoses: Impaired glucose control: actual or potential Goals: Patient/caregiver verbalizes  understanding of need to maintain therapeutic glucose control per primary care physician Date Initiated: 11/23/2019 Target Resolution Date: 12/11/2019 Goal Status: Active Interventions: Assess patient nutrition upon admission and as needed per policy Notes: Orientation to the Wound Care Program Nursing Diagnoses: Knowledge deficit related to the wound healing center program Goals: Patient/caregiver will verbalize understanding of the [place] Date Initiated: 11/23/2019 Target Resolution Date: 12/11/2019 Goal Status: Active Interventions: Provide education on orientation to the wound center Notes: Wound/Skin Impairment Nursing Diagnoses: Impaired tissue integrity Goals: Ulcer/skin breakdown will have a volume reduction of 30% by week 4 Date Initiated: 11/23/2019 Target Resolution Date: 12/11/2019 Goal Status: Active Interventions: Assess ulceration(s) every visit Notes: Electronic Signature(s) Signed: 12/07/2019 4:58:25 PM By: Army Melia Entered By: Army Melia on 12/07/2019 13:14:46 [name] ([id number]) [address] Linus Mako ([id number]) -------------------------------------------------------------------------------- Non-Wound Condition Assessment Details Patient Name: Corey West, Corey A. Date of Service: 12/07/2019 12:45 PM Medical Record Number: [id number] Patient Account Number: [id number] Date of Birth/Sex: [birth year] (69 y.o. M) Treating RN: Cornell Barman Primary Care Abrham Maslowski: [name] Other Clinician: Referring Mette Southgate: [name] Treating Draycen Leichter/Extender: Melburn Hake, HOYT Weeks in Treatment: 2 Non-Wound Condition: Condition: Lymphedema Location: Leg Side: Bilateral Photos Electronic Signature(s) Signed: 12/07/2019 5:15:52 PM By: Gretta Cool, BSN, RN, CWS, Kim RN, BSN Entered By: Gretta Cool, BSN, RN, CWS, Kim on 12/07/2019 13:08:10 [name] ([id number]) -------------------------------------------------------------------------------- Pain Assessment  Details Patient Name: Corey West, Corey A. Date of Service: 12/07/2019 12:45 PM Medical Record Number: [id number] Patient Account Number: [id number] Date of Birth/Sex: 01-31-1951 (69 y.o. M) Treating RN: Cornell Barman Primary Care Jackie Littlejohn: [name] Other Clinician: Referring Aayushi Solorzano: [name] Treating Sawyer Kahan/Extender: Melburn Hake, HOYT Weeks in Treatment: 2 Active Problems Location of Pain Severity and Description of Pain Patient Has Paino No Site Locations Pain Management and Medication Current Pain Management: Notes Patient denies pain at this time. Electronic Signature(s) Signed: 12/07/2019 5:15:52 PM By: Gretta Cool, BSN, RN, CWS, Kim RN, BSN Entered By: Gretta Cool, BSN, RN, CWS, Kim on 12/07/2019 12:58:31 [name] ([id number]) -------------------------------------------------------------------------------- Patient/Caregiver Education Details Patient Name: Corey West, Corey A. Date of Service: 12/07/2019 12:45 PM Medical Record Number: [id number] Patient Account Number: [id number] Date of Birth/Gender: [birth year] (69 y.o. M) Treating RN: Army Melia Primary Care Physician: [name] Other Clinician: Referring Physician: [name] Treating Physician/Extender: Sharalyn Ink in Treatment: 2 Education Assessment Education Provided To: Patient Education Topics Provided Wound/Skin Impairment: Handouts: Caring for Your Ulcer Methods: Demonstration, Explain/Verbal Responses: State content correctly Electronic Signature(s) Signed: 12/07/2019 4:58:25 PM By: Army Melia Entered By: Army Melia on 12/07/2019 13:21:49 [name] ([id number]) -------------------------------------------------------------------------------- Wound Assessment Details Patient Name: Corey Cower A. Date of Service:  12/07/2019 12:45 PM Medical Record Number: [id number] Patient Account Number: [id number] Date of Birth/Sex: 1950-11-18 (68 y.o. M) Treating RN: Cornell Barman Primary Care Kaydon Creedon: [name] Other Clinician: Referring Kari Kerth: [name] Treating Arnell Mausolf/Extender: Melburn Hake, HOYT Weeks in Treatment: 2 Wound Status Wound Number: 6 Primary Etiology: Lymphedema Wound Location: Left, Circumferential Lower Leg Wound Status: Healed - Epithelialized Wounding Event: Gradually Appeared Date Acquired: 11/15/2019 Weeks Of Treatment: 2 Clustered Wound: No Photos Photo Uploaded By: Gretta Cool, BSN, RN, CWS, Kim on 12/07/2019 13:08:48 Wound Measurements Length: (cm) Width: (cm) Depth: (cm) Area: (cm) Volume: (cm) 0 % Reduction in Area: 100% 0 % Reduction in Volume: 100% 0 0 0 Wound Description Classification: Full Thickness Without Exposed Support Structu res Engineer, maintenance) Signed: 12/07/2019 5:15:52 PM By: Gretta Cool, BSN, RN, CWS, Kim RN, BSN Entered By: Gretta Cool, BSN, RN, CWS, Kim on 12/07/2019 13:07:37 [name] ([id number]) -------------------------------------------------------------------------------- Wound Assessment Details Patient Name: Corey West, Corey A. Date of Service: 12/07/2019 12:45 PM Medical Record Number: [id number] Patient Account Number: [id number] Date of Birth/Sex: 1951/07/31 (69 y.o. M) Treating RN: Cornell Barman Primary Care Zalman Hull: [name] Other Clinician: Referring Jamorian Dimaria: [name] Treating Yue Glasheen/Extender: Melburn Hake, HOYT Weeks in Treatment: 2 Wound Status Wound Number: 7 Primary Etiology: Lymphedema Wound Location: Right, Posterior Lower Leg Wound Status: Healed - Epithelialized Wounding Event: Gradually Appeared Date Acquired: 11/15/2019 Weeks Of Treatment: 2 Clustered Wound: No Photos Photo Uploaded By: Gretta Cool, BSN, RN, CWS, Kim on 12/07/2019 13:08:50 Wound Measurements Length: (cm) Width: (cm) Depth: (cm) Area: (cm) Volume: (cm) 0 % Reduction in Area: 100% 0 % Reduction in Volume: 100% 0 0 0 Wound Description Classification: Full Thickness Without Exposed Support Structu res Metallurgist) Signed: 12/07/2019 5:15:52 PM By: Gretta Cool, BSN, RN, CWS, Kim RN, BSN Entered By: Gretta Cool, BSN, RN, CWS, Kim on 12/07/2019 13:07:37 [name] ([id number]) -------------------------------------------------------------------------------- Wound Assessment Details Patient Name: Corey West, Corey A. Date of Service: 12/07/2019 12:45 PM Medical Record Number: [id number] Patient Account Number: [id number] Date of Birth/Sex: 18-Aug-1951 (69 y.o. M) Treating RN: Cornell Barman Primary Care Tymeshia Awan: [name] Other Clinician: Referring Zavian Slowey: [name] Treating Sierria Bruney/Extender: Melburn Hake, HOYT Weeks in Treatment: 2 Wound Status Wound Number: 8 Primary Etiology: Lymphedema Wound Location: Right, Anterior Lower Leg Wound Status: Healed - Epithelialized Wounding Event: Gradually Appeared Date Acquired: 11/15/2019 Weeks Of Treatment: 2 Clustered Wound: No Photos Photo Uploaded By: Gretta Cool, BSN, RN, CWS, Kim on 12/07/2019 13:09:02 Wound Measurements Length: (cm) Width: (cm) Depth: (cm) Area: (cm) Volume: (cm) 0 % Reduction in Area: 100% 0 % Reduction in Volume: 100% 0 0 0 Wound Description Classification: Full Thickness Without Exposed Support Structu res Engineer, maintenance) Signed: 12/07/2019 5:15:52 PM By: Gretta Cool, BSN, RN, CWS, Kim RN, BSN Entered By: Gretta Cool, BSN, RN, CWS, Kim on 12/07/2019 13:07:37 [name] ([id number]) -------------------------------------------------------------------------------- Corey Details Patient Name: Corey Cower A. Date of Service: 12/07/2019 12:45 PM Medical Record Number: [id number] Patient Account Number: [id number] Date of Birth/Sex: August 25, 1951 (69 y.o. M) Treating RN: Cornell Barman Primary Care Royce Stegman: [name] Other Clinician: Referring [name] Donoso: [name] Treating Julyssa Kyer/Extender: Melburn Hake, HOYT Weeks in Treatment: 2 Vital Signs Time Taken: 09:20 Temperature (F): 98.1 Height (in): 72 Pulse (bpm): 86 Weight  (lbs): 318 Respiratory Rate (breaths/min): 18 Body Mass Index (BMI): 43.1 Blood Pressure (mmHg): 172/66 Reference Range: 80 - 120 mg / dl Electronic Signature(s) Signed: 12/07/2019 5:15:52 PM By: Gretta Cool, BSN, RN, CWS, Kim RN, BSN Entered By: Gretta Cool, BSN, RN, CWS, Kim on 12/07/2019 12:57:46

## 2019-12-14 ENCOUNTER — Encounter: Payer: Self-pay | Admitting: Emergency Medicine

## 2019-12-14 ENCOUNTER — Emergency Department: Payer: Medicare Other

## 2019-12-14 ENCOUNTER — Encounter: Payer: Medicare Other | Admitting: Physician Assistant

## 2019-12-14 ENCOUNTER — Ambulatory Visit: Payer: Medicare Other

## 2019-12-14 ENCOUNTER — Inpatient Hospital Stay
Admission: EM | Admit: 2019-12-14 | Discharge: 2019-12-17 | DRG: 871 | Disposition: A | Discharge status: Home Health | Payer: Medicare Other | Attending: Internal Medicine | Admitting: Internal Medicine

## 2019-12-14 ENCOUNTER — Other Ambulatory Visit: Payer: Self-pay

## 2019-12-14 DIAGNOSIS — E1122 Type 2 diabetes mellitus with diabetic chronic kidney disease: Secondary | ICD-10-CM | POA: Diagnosis present

## 2019-12-14 DIAGNOSIS — Z8249 Family history of ischemic heart disease and other diseases of the circulatory system: Secondary | ICD-10-CM

## 2019-12-14 DIAGNOSIS — I5033 Acute on chronic diastolic (congestive) heart failure: Secondary | ICD-10-CM | POA: Diagnosis present

## 2019-12-14 DIAGNOSIS — M79605 Pain in left leg: Secondary | ICD-10-CM | POA: Diagnosis not present

## 2019-12-14 DIAGNOSIS — I872 Venous insufficiency (chronic) (peripheral): Secondary | ICD-10-CM | POA: Diagnosis present

## 2019-12-14 DIAGNOSIS — F419 Anxiety disorder, unspecified: Secondary | ICD-10-CM | POA: Diagnosis present

## 2019-12-14 DIAGNOSIS — I248 Other forms of acute ischemic heart disease: Secondary | ICD-10-CM | POA: Diagnosis present

## 2019-12-14 DIAGNOSIS — Z6841 Body Mass Index (BMI) 40.0 and over, adult: Secondary | ICD-10-CM | POA: Diagnosis not present

## 2019-12-14 DIAGNOSIS — I5032 Chronic diastolic (congestive) heart failure: Secondary | ICD-10-CM | POA: Diagnosis present

## 2019-12-14 DIAGNOSIS — D696 Thrombocytopenia, unspecified: Secondary | ICD-10-CM | POA: Diagnosis present

## 2019-12-14 DIAGNOSIS — K7460 Unspecified cirrhosis of liver: Secondary | ICD-10-CM | POA: Diagnosis present

## 2019-12-14 DIAGNOSIS — K2270 Barrett's esophagus without dysplasia: Secondary | ICD-10-CM | POA: Diagnosis present

## 2019-12-14 DIAGNOSIS — N179 Acute kidney failure, unspecified: Secondary | ICD-10-CM | POA: Diagnosis present

## 2019-12-14 DIAGNOSIS — A4159 Other Gram-negative sepsis: Principal | ICD-10-CM | POA: Diagnosis present

## 2019-12-14 DIAGNOSIS — Z79891 Long term (current) use of opiate analgesic: Secondary | ICD-10-CM

## 2019-12-14 DIAGNOSIS — N1831 Chronic kidney disease, stage 3a: Secondary | ICD-10-CM | POA: Diagnosis present

## 2019-12-14 DIAGNOSIS — D509 Iron deficiency anemia, unspecified: Secondary | ICD-10-CM | POA: Diagnosis present

## 2019-12-14 DIAGNOSIS — E1159 Type 2 diabetes mellitus with other circulatory complications: Secondary | ICD-10-CM | POA: Diagnosis present

## 2019-12-14 DIAGNOSIS — N184 Chronic kidney disease, stage 4 (severe): Secondary | ICD-10-CM

## 2019-12-14 DIAGNOSIS — Z20822 Contact with and (suspected) exposure to covid-19: Secondary | ICD-10-CM | POA: Diagnosis present

## 2019-12-14 DIAGNOSIS — I130 Hypertensive heart and chronic kidney disease with heart failure and stage 1 through stage 4 chronic kidney disease, or unspecified chronic kidney disease: Secondary | ICD-10-CM | POA: Diagnosis present

## 2019-12-14 DIAGNOSIS — Z9089 Acquired absence of other organs: Secondary | ICD-10-CM

## 2019-12-14 DIAGNOSIS — Z8719 Personal history of other diseases of the digestive system: Secondary | ICD-10-CM

## 2019-12-14 DIAGNOSIS — I10 Essential (primary) hypertension: Secondary | ICD-10-CM | POA: Diagnosis present

## 2019-12-14 DIAGNOSIS — R188 Other ascites: Secondary | ICD-10-CM | POA: Diagnosis present

## 2019-12-14 DIAGNOSIS — Z87891 Personal history of nicotine dependence: Secondary | ICD-10-CM

## 2019-12-14 DIAGNOSIS — L03115 Cellulitis of right lower limb: Secondary | ICD-10-CM | POA: Diagnosis present

## 2019-12-14 DIAGNOSIS — K219 Gastro-esophageal reflux disease without esophagitis: Secondary | ICD-10-CM | POA: Diagnosis present

## 2019-12-14 DIAGNOSIS — K529 Noninfective gastroenteritis and colitis, unspecified: Secondary | ICD-10-CM | POA: Diagnosis present

## 2019-12-14 DIAGNOSIS — L03116 Cellulitis of left lower limb: Secondary | ICD-10-CM | POA: Diagnosis present

## 2019-12-14 DIAGNOSIS — E785 Hyperlipidemia, unspecified: Secondary | ICD-10-CM | POA: Diagnosis present

## 2019-12-14 DIAGNOSIS — Z794 Long term (current) use of insulin: Secondary | ICD-10-CM

## 2019-12-14 DIAGNOSIS — R778 Other specified abnormalities of plasma proteins: Secondary | ICD-10-CM | POA: Diagnosis not present

## 2019-12-14 DIAGNOSIS — A419 Sepsis, unspecified organism: Secondary | ICD-10-CM | POA: Diagnosis not present

## 2019-12-14 DIAGNOSIS — I440 Atrioventricular block, first degree: Secondary | ICD-10-CM | POA: Diagnosis present

## 2019-12-14 DIAGNOSIS — N183 Chronic kidney disease, stage 3 unspecified: Secondary | ICD-10-CM | POA: Diagnosis present

## 2019-12-14 DIAGNOSIS — Z79899 Other long term (current) drug therapy: Secondary | ICD-10-CM

## 2019-12-14 DIAGNOSIS — E1129 Type 2 diabetes mellitus with other diabetic kidney complication: Secondary | ICD-10-CM | POA: Diagnosis present

## 2019-12-14 DIAGNOSIS — Z8619 Personal history of other infectious and parasitic diseases: Secondary | ICD-10-CM

## 2019-12-14 DIAGNOSIS — D500 Iron deficiency anemia secondary to blood loss (chronic): Secondary | ICD-10-CM | POA: Diagnosis not present

## 2019-12-14 DIAGNOSIS — N189 Chronic kidney disease, unspecified: Secondary | ICD-10-CM | POA: Diagnosis present

## 2019-12-14 DIAGNOSIS — K7469 Other cirrhosis of liver: Secondary | ICD-10-CM | POA: Diagnosis not present

## 2019-12-14 DIAGNOSIS — R0602 Shortness of breath: Secondary | ICD-10-CM | POA: Diagnosis present

## 2019-12-14 DIAGNOSIS — Z833 Family history of diabetes mellitus: Secondary | ICD-10-CM

## 2019-12-14 DIAGNOSIS — L03119 Cellulitis of unspecified part of limb: Secondary | ICD-10-CM | POA: Diagnosis present

## 2019-12-14 DIAGNOSIS — G4730 Sleep apnea, unspecified: Secondary | ICD-10-CM | POA: Diagnosis present

## 2019-12-14 DIAGNOSIS — R7989 Other specified abnormal findings of blood chemistry: Secondary | ICD-10-CM | POA: Diagnosis present

## 2019-12-14 DIAGNOSIS — Z823 Family history of stroke: Secondary | ICD-10-CM

## 2019-12-14 DIAGNOSIS — E119 Type 2 diabetes mellitus without complications: Secondary | ICD-10-CM

## 2019-12-14 DIAGNOSIS — Z789 Other specified health status: Secondary | ICD-10-CM

## 2019-12-14 LAB — CBC WITH DIFFERENTIAL/PLATELET
Abs Immature Granulocytes: 0.04 10*3/uL (ref 0.00–0.07)
Basophils Absolute: 0 10*3/uL (ref 0.0–0.1)
Basophils Relative: 0 %
Eosinophils Absolute: 0 10*3/uL (ref 0.0–0.5)
Eosinophils Relative: 0 %
HCT: 26.6 % — ABNORMAL LOW (ref 39.0–52.0)
Hemoglobin: 8.2 g/dL — ABNORMAL LOW (ref 13.0–17.0)
Immature Granulocytes: 0 %
Lymphocytes Relative: 5 %
Lymphs Abs: 0.6 10*3/uL — ABNORMAL LOW (ref 0.7–4.0)
MCH: 30.1 pg (ref 26.0–34.0)
MCHC: 30.8 g/dL (ref 30.0–36.0)
MCV: 97.8 fL (ref 80.0–100.0)
Monocytes Absolute: 0.4 10*3/uL (ref 0.1–1.0)
Monocytes Relative: 4 %
Neutro Abs: 10.6 10*3/uL — ABNORMAL HIGH (ref 1.7–7.7)
Neutrophils Relative %: 91 %
Platelets: 172 10*3/uL (ref 150–400)
RBC: 2.72 MIL/uL — ABNORMAL LOW (ref 4.22–5.81)
RDW: 16.6 % — ABNORMAL HIGH (ref 11.5–15.5)
WBC: 11.7 10*3/uL — ABNORMAL HIGH (ref 4.0–10.5)
nRBC: 0 % (ref 0.0–0.2)

## 2019-12-14 LAB — COMPREHENSIVE METABOLIC PANEL
ALT: 19 U/L (ref 0–44)
AST: 29 U/L (ref 15–41)
Albumin: 2.7 g/dL — ABNORMAL LOW (ref 3.5–5.0)
Alkaline Phosphatase: 80 U/L (ref 38–126)
Anion gap: 12 (ref 5–15)
BUN: 41 mg/dL — ABNORMAL HIGH (ref 8–23)
CO2: 21 mmol/L — ABNORMAL LOW (ref 22–32)
Calcium: 8.5 mg/dL — ABNORMAL LOW (ref 8.9–10.3)
Chloride: 108 mmol/L (ref 98–111)
Creatinine, Ser: 2.77 mg/dL — ABNORMAL HIGH (ref 0.61–1.24)
GFR calc Af Amer: 26 mL/min — ABNORMAL LOW (ref >60)
GFR calc non Af Amer: 23 mL/min — ABNORMAL LOW (ref >60)
Glucose, Bld: 111 mg/dL — ABNORMAL HIGH (ref 70–99)
Potassium: 4.1 mmol/L (ref 3.5–5.1)
Sodium: 141 mmol/L (ref 135–145)
Total Bilirubin: 0.8 mg/dL (ref 0.3–1.2)
Total Protein: 6.9 g/dL (ref 6.5–8.1)

## 2019-12-14 LAB — C-REACTIVE PROTEIN: CRP: 4.6 mg/dL — ABNORMAL HIGH (ref <1.0)

## 2019-12-14 LAB — PROTIME-INR
INR: 1.4 — ABNORMAL HIGH (ref 0.8–1.2)
Prothrombin Time: 16.9 seconds — ABNORMAL HIGH (ref 11.4–15.2)

## 2019-12-14 LAB — BRAIN NATRIURETIC PEPTIDE: B Natriuretic Peptide: 131 pg/mL — ABNORMAL HIGH (ref 0.0–100.0)

## 2019-12-14 LAB — BLOOD CULTURE ID PANEL (REFLEXED)
Acinetobacter baumannii: NOT DETECTED
Candida albicans: NOT DETECTED
Candida glabrata: NOT DETECTED
Candida krusei: NOT DETECTED
Candida parapsilosis: NOT DETECTED
Candida tropicalis: NOT DETECTED
Carbapenem resistance: NOT DETECTED
Enterobacter cloacae complex: NOT DETECTED
Enterobacteriaceae species: DETECTED — AB
Enterococcus species: NOT DETECTED
Escherichia coli: NOT DETECTED
Haemophilus influenzae: NOT DETECTED
Klebsiella oxytoca: DETECTED — AB
Klebsiella pneumoniae: NOT DETECTED
Listeria monocytogenes: NOT DETECTED
Neisseria meningitidis: NOT DETECTED
Proteus species: NOT DETECTED
Pseudomonas aeruginosa: NOT DETECTED
Serratia marcescens: NOT DETECTED
Staphylococcus aureus (BCID): NOT DETECTED
Staphylococcus species: NOT DETECTED
Streptococcus agalactiae: NOT DETECTED
Streptococcus pneumoniae: NOT DETECTED
Streptococcus pyogenes: NOT DETECTED
Streptococcus species: NOT DETECTED

## 2019-12-14 LAB — PROCALCITONIN: Procalcitonin: 2.66 ng/mL

## 2019-12-14 LAB — TROPONIN I (HIGH SENSITIVITY)
Troponin I (High Sensitivity): 22 ng/L — ABNORMAL HIGH (ref <18)
Troponin I (High Sensitivity): 191 ng/L (ref <18)
Troponin I (High Sensitivity): 539 ng/L (ref <18)
Troponin I (High Sensitivity): 622 ng/L (ref <18)
Troponin I (High Sensitivity): 582 ng/L (ref <18)

## 2019-12-14 LAB — AMMONIA: Ammonia: 30 umol/L (ref 9–35)

## 2019-12-14 LAB — GLUCOSE, CAPILLARY
Glucose-Capillary: 128 mg/dL — ABNORMAL HIGH (ref 70–99)
Glucose-Capillary: 137 mg/dL — ABNORMAL HIGH (ref 70–99)

## 2019-12-14 LAB — SEDIMENTATION RATE: Sed Rate: 93 mm/hr — ABNORMAL HIGH (ref 0–20)

## 2019-12-14 LAB — LACTIC ACID, PLASMA
Lactic Acid, Venous: 2.3 mmol/L (ref 0.5–1.9)
Lactic Acid, Venous: 1.4 mmol/L (ref 0.5–1.9)
Lactic Acid, Venous: 1.8 mmol/L (ref 0.5–1.9)
Lactic Acid, Venous: 1.6 mmol/L (ref 0.5–1.9)

## 2019-12-14 LAB — APTT: aPTT: 34 seconds (ref 24–36)

## 2019-12-14 LAB — SARS CORONAVIRUS 2 (TAT 6-24 HRS): SARS Coronavirus 2: NEGATIVE

## 2019-12-14 MED ORDER — LORAZEPAM 0.5 MG PO TABS
0.5000 mg | ORAL_TABLET | Freq: Three times a day (TID) | ORAL | Status: DC | PRN
  Administered 2019-12-15 – 2019-12-17 (×4): 0.5 mg via ORAL
  Filled 2019-12-14 (×4): qty 1

## 2019-12-14 MED ORDER — SODIUM CHLORIDE 0.9 % IV SOLN
INTRAVENOUS | Status: DC | PRN
  Administered 2019-12-14 – 2019-12-15 (×2): 250 mL via INTRAVENOUS

## 2019-12-14 MED ORDER — SODIUM CHLORIDE 0.9 % IV SOLN
2.0000 g | INTRAVENOUS | Status: DC
  Administered 2019-12-15: 12:00:00 2 g via INTRAVENOUS
  Filled 2019-12-14: qty 2
  Filled 2019-12-14 (×2): qty 20

## 2019-12-14 MED ORDER — FUROSEMIDE 40 MG PO TABS
80.0000 mg | ORAL_TABLET | Freq: Two times a day (BID) | ORAL | Status: DC
  Administered 2019-12-14 – 2019-12-17 (×6): 80 mg via ORAL
  Filled 2019-12-14 (×6): qty 2

## 2019-12-14 MED ORDER — IPRATROPIUM BROMIDE 0.02 % IN SOLN: 0.5000 mg | Freq: Four times a day (QID) | RESPIRATORY_TRACT | Status: DC

## 2019-12-14 MED ORDER — INSULIN ASPART PROT & ASPART (70-30 MIX) 100 UNIT/ML ~~LOC~~ SUSP
60.0000 [IU] | Freq: Two times a day (BID) | SUBCUTANEOUS | Status: DC
  Administered 2019-12-15 – 2019-12-17 (×5): 60 [IU] via SUBCUTANEOUS
  Filled 2019-12-14 (×5): qty 10

## 2019-12-14 MED ORDER — DM-GUAIFENESIN ER 30-600 MG PO TB12
1.0000 | ORAL_TABLET | Freq: Two times a day (BID) | ORAL | Status: DC
  Administered 2019-12-14 – 2019-12-17 (×7): 1 via ORAL
  Filled 2019-12-14 (×7): qty 1

## 2019-12-14 MED ORDER — ADULT MULTIVITAMIN W/MINERALS CH
1.0000 | ORAL_TABLET | Freq: Every day | ORAL | Status: DC
  Administered 2019-12-15 – 2019-12-17 (×3): 1 via ORAL
  Filled 2019-12-14 (×3): qty 1

## 2019-12-14 MED ORDER — TECHNETIUM TO 99M ALBUMIN AGGREGATED
4.0000 | Freq: Once | INTRAVENOUS | Status: AC | PRN
  Administered 2019-12-14: 4.47 via INTRAVENOUS

## 2019-12-14 MED ORDER — INSULIN ASPART 100 UNIT/ML ~~LOC~~ SOLN
0.0000 [IU] | SUBCUTANEOUS | Status: DC
  Administered 2019-12-14: 1 [IU] via SUBCUTANEOUS
  Administered 2019-12-15: 04:00:00 2 [IU] via SUBCUTANEOUS
  Administered 2019-12-15 (×2): 1 [IU] via SUBCUTANEOUS
  Administered 2019-12-15: 2 [IU] via SUBCUTANEOUS
  Administered 2019-12-15: 21:00:00 1 [IU] via SUBCUTANEOUS
  Administered 2019-12-15: 2 [IU] via SUBCUTANEOUS
  Administered 2019-12-16 (×3): 1 [IU] via SUBCUTANEOUS
  Filled 2019-12-14 (×10): qty 1

## 2019-12-14 MED ORDER — IPRATROPIUM BROMIDE 0.02 % IN SOLN: 0.5000 mg | Freq: Four times a day (QID) | RESPIRATORY_TRACT | Status: DC | PRN

## 2019-12-14 MED ORDER — LACTULOSE 10 GM/15ML PO SOLN
30.0000 g | Freq: Four times a day (QID) | ORAL | Status: DC
  Administered 2019-12-14 – 2019-12-17 (×5): 30 g via ORAL
  Filled 2019-12-14 (×6): qty 60

## 2019-12-14 MED ORDER — VANCOMYCIN HCL IN DEXTROSE 1-5 GM/200ML-% IV SOLN
1000.0000 mg | INTRAVENOUS | Status: DC
  Administered 2019-12-15: 1000 mg via INTRAVENOUS
  Filled 2019-12-14 (×2): qty 200

## 2019-12-14 MED ORDER — LORATADINE 10 MG PO TABS
10.0000 mg | ORAL_TABLET | Freq: Every day | ORAL | Status: DC
  Administered 2019-12-14 – 2019-12-17 (×4): 10 mg via ORAL
  Filled 2019-12-14 (×4): qty 1

## 2019-12-14 MED ORDER — ALBUTEROL SULFATE (2.5 MG/3ML) 0.083% IN NEBU: 2.5000 mg | INHALATION_SOLUTION | RESPIRATORY_TRACT | Status: DC | PRN

## 2019-12-14 MED ORDER — IPRATROPIUM BROMIDE 0.02 % IN SOLN
0.5000 mg | RESPIRATORY_TRACT | Status: DC
  Filled 2019-12-14: qty 2.5

## 2019-12-14 MED ORDER — ALBUTEROL SULFATE HFA 108 (90 BASE) MCG/ACT IN AERS: 2.0000 | INHALATION_SPRAY | RESPIRATORY_TRACT | Status: DC | PRN

## 2019-12-14 MED ORDER — VANCOMYCIN HCL IN DEXTROSE 1-5 GM/200ML-% IV SOLN
1000.0000 mg | Freq: Once | INTRAVENOUS | Status: AC
  Administered 2019-12-14: 1000 mg via INTRAVENOUS
  Filled 2019-12-14: qty 200

## 2019-12-14 MED ORDER — IPRATROPIUM BROMIDE HFA 17 MCG/ACT IN AERS: 2.0000 | INHALATION_SPRAY | Freq: Four times a day (QID) | RESPIRATORY_TRACT | Status: DC

## 2019-12-14 MED ORDER — SODIUM CHLORIDE 0.9 % IV SOLN
2.0000 g | Freq: Once | INTRAVENOUS | Status: AC
  Administered 2019-12-14: 2 g via INTRAVENOUS
  Filled 2019-12-14: qty 20

## 2019-12-14 MED ORDER — TRAMADOL HCL 50 MG PO TABS
50.0000 mg | ORAL_TABLET | Freq: Four times a day (QID) | ORAL | Status: DC | PRN
  Administered 2019-12-14 – 2019-12-17 (×8): 50 mg via ORAL
  Filled 2019-12-14 (×8): qty 1

## 2019-12-14 MED ORDER — HEPARIN SODIUM (PORCINE) 5000 UNIT/ML IJ SOLN
5000.0000 [IU] | Freq: Three times a day (TID) | INTRAMUSCULAR | Status: DC
  Administered 2019-12-14 – 2019-12-17 (×8): 5000 [IU] via SUBCUTANEOUS
  Filled 2019-12-14 (×8): qty 1

## 2019-12-14 MED ORDER — ONDANSETRON HCL 4 MG/2ML IJ SOLN: 4.0000 mg | Freq: Three times a day (TID) | INTRAMUSCULAR | Status: DC | PRN

## 2019-12-14 MED ORDER — HYDRALAZINE HCL 25 MG PO TABS
25.0000 mg | ORAL_TABLET | Freq: Three times a day (TID) | ORAL | Status: DC | PRN
  Administered 2019-12-16: 17:00:00 25 mg via ORAL
  Filled 2019-12-14: qty 1

## 2019-12-14 MED ORDER — PANTOPRAZOLE SODIUM 40 MG PO TBEC
40.0000 mg | DELAYED_RELEASE_TABLET | Freq: Two times a day (BID) | ORAL | Status: DC
  Administered 2019-12-14 – 2019-12-17 (×6): 40 mg via ORAL
  Filled 2019-12-14 (×6): qty 1

## 2019-12-14 MED ORDER — FUROSEMIDE 10 MG/ML IJ SOLN
80.0000 mg | Freq: Once | INTRAMUSCULAR | Status: AC
  Administered 2019-12-14: 09:00:00 80 mg via INTRAVENOUS
  Filled 2019-12-14: qty 8

## 2019-12-14 MED ORDER — ATORVASTATIN CALCIUM 20 MG PO TABS
20.0000 mg | ORAL_TABLET | Freq: Every day | ORAL | Status: DC
  Administered 2019-12-14 – 2019-12-16 (×3): 20 mg via ORAL
  Filled 2019-12-14 (×3): qty 1

## 2019-12-14 MED ORDER — FERROUS SULFATE 325 (65 FE) MG PO TABS
325.0000 mg | ORAL_TABLET | Freq: Two times a day (BID) | ORAL | Status: DC
  Administered 2019-12-15 – 2019-12-17 (×5): 325 mg via ORAL
  Filled 2019-12-14 (×5): qty 1

## 2019-12-14 MED ORDER — VANCOMYCIN HCL 1500 MG/300ML IV SOLN
1500.0000 mg | Freq: Once | INTRAVENOUS | Status: AC
  Administered 2019-12-14: 1500 mg via INTRAVENOUS
  Filled 2019-12-14: qty 300

## 2019-12-14 MED ORDER — SODIUM CHLORIDE 0.9 % IV BOLUS
500.0000 mL | Freq: Once | INTRAVENOUS | Status: AC
  Administered 2019-12-14: 500 mL via INTRAVENOUS

## 2019-12-14 NOTE — Consult Note (Signed)
  Pharmacy Antibiotic Note  Corey West is a 69 y.o. male admitted on 12/14/2019 with cellulitis. He was recently hospitalized from 2/26-11/30/2019 due to lower extremity cellulitis and treated with IV antibiotics in hospital then discharged on Keflex and completed the course. Pharmacy has been consulted for vancomycin dosing. Additionally, he has a history of MRSA bacteremia. His Scr is elevated above his approximate baseline of 2.5 mg/dL. He received 1000 mg IV vancomycin in the ED.  Plan:  Begin vancomycin 1500 mg IV x 1 to complete the loading dose, then 1000 mg every 24 hrs Goal AUC 400-550 Expected AUC: 496.5 SCr used: 2.77 T1/2: 24h Css (calculated): 28.4/14.9 mcg/mL  Weight: (!) 317 lb (143.8 kg)(from 3/17 when pt weighed at home)  Temp (24hrs), Avg:99.2 F (37.3 C), Min:98.5 F (36.9 C), Max:99.9 F (37.7 C)  Recent Labs  Lab 12/14/19 0908 12/14/19 1206  WBC 11.7*  --   CREATININE 2.77*  --   LATICACIDVEN 2.3* 1.4    Estimated Creatinine Clearance: 38.1 mL/min (A) (by C-G formula based on SCr of 2.77 mg/dL (H)).    No Known Allergies  Antimicrobials this admission: ceftriaxone 3/15 >>  vancomycin 3/15 >>   Microbiology results: 3/15 BCx: negative 3/15 SARS CoV-2: negative   Thank you for allowing pharmacy to be a part of this patient's care.  Dallie Piles 12/14/2019 6:02 PM

## 2019-12-14 NOTE — ED Notes (Signed)
Date and time results received: 12/14/19 2:08 PM  Test:Troponin Critical Value: 191  Name of Provider Notified: Blaine Hamper

## 2019-12-14 NOTE — ED Notes (Signed)
Transported to [facility]

## 2019-12-14 NOTE — H&P (Signed)
History and Physical    Corey West Corey West DOB: 04-20-1951 DOA: 12/14/2019  Referring MD/NP/PA:   PCP: Baxter Hire, MD   Patient coming from:  The patient is coming from home.  At baseline, pt is independent for most of ADL.        Chief Complaint:  SOB, leg pain and left swelling  HPI: Corey West is a 69 y.o. male with medical history significant of hypertension, hyperlipidemia, diabetes mellitus, chronic lower extremity venous insufficiency change, GERD, anxiety, iron deficiency anemia, liver cirrhosis, AVM, dCHF, GI bleeding, CKD-3a, who presents with leg pain and swelling, SOB.  Patient was recently hospitalized from 2/26-11/30/2019 due to lower extremity cellulitis.  Patient was treated with IV antibiotics in hospital, and discharged on Keflex.  Patient completed a course of oral Keflex as outpatient, but still has bilateral leg pain worse on the left side.  His left leg is still erythematous and warm.  Patient does not have fever and chills.  Patient reports shortness of breath, orthopnea, sleeping 3 pillows at night.  He has dry cough, but no chest pain.  Patient does not have nausea, vomiting, diarrhea, abdominal pain, symptoms of UTI or unilateral weakness.  ED Course: pt was found to have WBC 11.7, lactic acid 2.3, troponin 22, ammonia level 30, pending COVID-19 PCR, worsening renal function, temperature 99.9, blood pressure 193/57, 165/59, tachycardia, tachypnea, oxygen saturation 94% on room air.  Chest x-ray showed cardiomegaly and vascular congestion.  CT of the chest showed trace left pleural effusion, no infiltration.  Patient is admitted to [facility] bed as inpatient. V/Q scan is ordered by EDP.   Review of Systems:   General: no fevers, chills, no body weight gain, has poor appetite, has fatigue HEENT: no blurry vision, hearing changes or sore throat Respiratory: has dyspnea, no coughing, wheezing CV: no chest pain, no palpitations GI: no nausea, vomiting,  abdominal pain, diarrhea, constipation GU: no dysuria, burning on urination, increased urinary frequency, hematuria  Ext: has leg edema Neuro: no unilateral weakness, numbness, or tingling, no vision change or hearing loss Skin: no rash. MSK: No muscle spasm, no deformity, no limitation of range of movement in spin Heme: No easy bruising.  Travel history: No recent long distant travel.  Allergy: No Known Allergies  Past Medical History:  Diagnosis Date  . Anemia   . Anxiety    controlled;   . Arthritis   . AVM (arteriovenous malformation) of stomach, acquired with hemorrhage   . Barrett's esophagus   . Chronic kidney disease    renal infufficiency  . Cirrhosis ([place])   . Depression    controlled;   Marland Kitchen Diabetes mellitus without complication ([place])    not controlled, taking insulin but sugar continues to run high;   . Edema   . Esophageal varices ([place])   . GAVE (gastric antral vascular ectasia)   . GERD (gastroesophageal reflux disease)   . History of hiatal hernia   . Hyperlipidemia   . Hypertension    controlled well;   . Liver cirrhosis ([place])   . Nephropathy, diabetic ([place])   . Obesity   . Pancytopenia ([place])   . Polyp, stomach    with chronic blood loss  . Sleep apnea    does not wear a cpap, Medicare would not pay for it   . Venous stasis dermatitis of both lower extremities   . Venous stasis of both lower extremities    with cellulitis    Past Surgical History:  Procedure Laterality Date  . ESOPHAGOGASTRODUODENOSCOPY N/A 02/09/2015   Procedure: ESOPHAGOGASTRODUODENOSCOPY (EGD);  Surgeon: Manya Silvas, MD;  Location: [facility] ENDOSCOPY;  Service: Endoscopy;  Laterality: N/A;  . ESOPHAGOGASTRODUODENOSCOPY N/A 07/22/2015   Procedure: ESOPHAGOGASTRODUODENOSCOPY (EGD);  Surgeon: Manya Silvas, MD;  Location: [facility] ENDOSCOPY;  Service: Endoscopy;  Laterality: N/A;  . ESOPHAGOGASTRODUODENOSCOPY N/A 06/26/2017   Procedure: ESOPHAGOGASTRODUODENOSCOPY (EGD);  Surgeon:  Manya Silvas, MD;  Location: [facility] ENDOSCOPY;  Service: Endoscopy;  Laterality: N/A;  . ESOPHAGOGASTRODUODENOSCOPY N/A 06/27/2017   Procedure: ESOPHAGOGASTRODUODENOSCOPY (EGD);  Surgeon: Manya Silvas, MD;  Location: [facility] ENDOSCOPY;  Service: Endoscopy;  Laterality: N/A;  . ESOPHAGOGASTRODUODENOSCOPY N/A 06/28/2017   Procedure: ESOPHAGOGASTRODUODENOSCOPY (EGD);  Surgeon: Manya Silvas, MD;  Location: [facility] ENDOSCOPY;  Service: Endoscopy;  Laterality: N/A;  . ESOPHAGOGASTRODUODENOSCOPY Bilateral 10/11/2017   Procedure: ESOPHAGOGASTRODUODENOSCOPY (EGD);  Surgeon: Manya Silvas, MD;  Location: [facility] ENDOSCOPY;  Service: Endoscopy;  Laterality: Bilateral;  . ESOPHAGOGASTRODUODENOSCOPY N/A 12/04/2017   Procedure: ESOPHAGOGASTRODUODENOSCOPY (EGD);  Surgeon: Manya Silvas, MD;  Location: [facility] ENDOSCOPY;  Service: Endoscopy;  Laterality: N/A;  . ESOPHAGOGASTRODUODENOSCOPY N/A 03/06/2019   Procedure: ESOPHAGOGASTRODUODENOSCOPY (EGD);  Surgeon: Toledo, Benay Pike, MD;  Location: [facility] ENDOSCOPY;  Service: Gastroenterology;  Laterality: N/A;  . ESOPHAGOGASTRODUODENOSCOPY (EGD) WITH PROPOFOL N/A 07/20/2015   Procedure: ESOPHAGOGASTRODUODENOSCOPY (EGD) WITH PROPOFOL;  Surgeon: Manya Silvas, MD;  Location: [facility] ENDOSCOPY;  Service: Endoscopy;  Laterality: N/A;  . ESOPHAGOGASTRODUODENOSCOPY (EGD) WITH PROPOFOL N/A 09/16/2015   Procedure: ESOPHAGOGASTRODUODENOSCOPY (EGD) WITH PROPOFOL;  Surgeon: Manya Silvas, MD;  Location: [facility] ENDOSCOPY;  Service: Endoscopy;  Laterality: N/A;  . ESOPHAGOGASTRODUODENOSCOPY (EGD) WITH PROPOFOL N/A 03/16/2016   Procedure: ESOPHAGOGASTRODUODENOSCOPY (EGD) WITH PROPOFOL;  Surgeon: Manya Silvas, MD;  Location: [facility] ENDOSCOPY;  Service: Endoscopy;  Laterality: N/A;  . ESOPHAGOGASTRODUODENOSCOPY (EGD) WITH PROPOFOL N/A 09/14/2016   Procedure: ESOPHAGOGASTRODUODENOSCOPY (EGD) WITH PROPOFOL;  Surgeon: Manya Silvas, MD;  Location: [facility] ENDOSCOPY;  Service:  Endoscopy;  Laterality: N/A;  . ESOPHAGOGASTRODUODENOSCOPY (EGD) WITH PROPOFOL N/A 11/05/2016   Procedure: ESOPHAGOGASTRODUODENOSCOPY (EGD) WITH PROPOFOL;  Surgeon: Manya Silvas, MD;  Location: [facility] ENDOSCOPY;  Service: Endoscopy;  Laterality: N/A;  . ESOPHAGOGASTRODUODENOSCOPY (EGD) WITH PROPOFOL N/A 02/06/2017   Procedure: ESOPHAGOGASTRODUODENOSCOPY (EGD) WITH PROPOFOL;  Surgeon: Manya Silvas, MD;  Location: [facility] ENDOSCOPY;  Service: Endoscopy;  Laterality: N/A;  . ESOPHAGOGASTRODUODENOSCOPY (EGD) WITH PROPOFOL N/A 04/24/2017   Procedure: ESOPHAGOGASTRODUODENOSCOPY (EGD) WITH PROPOFOL;  Surgeon: Manya Silvas, MD;  Location: [facility] ENDOSCOPY;  Service: Endoscopy;  Laterality: N/A;  . ESOPHAGOGASTRODUODENOSCOPY (EGD) WITH PROPOFOL N/A 07/31/2017   Procedure: ESOPHAGOGASTRODUODENOSCOPY (EGD) WITH PROPOFOL;  Surgeon: Manya Silvas, MD;  Location: [facility] ENDOSCOPY;  Service: Endoscopy;  Laterality: N/A;  . ESOPHAGOGASTRODUODENOSCOPY (EGD) WITH PROPOFOL N/A 08/08/2018   Procedure: ESOPHAGOGASTRODUODENOSCOPY (EGD) WITH PROPOFOL;  Surgeon: Manya Silvas, MD;  Location: [facility] ENDOSCOPY;  Service: Endoscopy;  Laterality: N/A;  . GIVENS CAPSULE STUDY N/A 12/05/2017   Procedure: GIVENS CAPSULE STUDY;  Surgeon: Manya Silvas, MD;  Location: [facility] ENDOSCOPY;  Service: Endoscopy;  Laterality: N/A;  . TONSILLECTOMY    . TONSILLECTOMY AND ADENOIDECTOMY    . ULNAR NERVE TRANSPOSITION    . UVULOPALATOPHARYNGOPLASTY      Social History:  reports that he has quit smoking. His smoking use included cigars and cigarettes. He quit after 20.00 years of use. He has never used smokeless tobacco. He reports that he does not drink alcohol or use drugs.  Family History:  Family History  Problem Relation Age of Onset  .  Diabetes Other   . Transient ischemic attack Father   . CAD Father      Prior to Admission medications   Medication Sig Start Date End Date Taking? Authorizing Provider  cetirizine  (ZYRTEC) 10 MG tablet Take 10 mg by mouth daily.   Yes [provider]  ferrous sulfate 325 (65 FE) MG tablet Take 325 mg by mouth 2 (two) times daily with a meal.    Yes [provider]  furosemide (LASIX) 40 MG tablet Take 80 mg by mouth 2 (two) times a day.    Yes [provider]  insulin aspart protamine - aspart (NOVOLOG 70/30 MIX) (70-30) 100 UNIT/ML FlexPen Inject 84 Units into the skin 2 (two) times daily.    Yes [provider]  lactulose (CHRONULAC) 10 GM/15ML solution Take 30 g by mouth 4 (four) times daily.    Yes [provider]  LORazepam (ATIVAN) 0.5 MG tablet Take 1 tablet (0.5 mg total) by mouth every 8 (eight) hours as needed for anxiety. 10/18/18  Yes Gladstone Lighter, MD  Multiple Vitamin (MULTIVITAMIN) tablet Take 1 tablet by mouth daily.   Yes [provider]  pantoprazole (PROTONIX) 40 MG tablet Take 1 tablet (40 mg total) by mouth 2 (two) times daily. 03/06/19 03/05/20 Yes Dustin Flock, MD  Potassium Chloride ER 20 MEQ TBCR Take 20 mEq by mouth 2 (two) times daily. While on lasix' Patient taking differently: Take 20 mEq by mouth 2 (two) times daily.  10/18/18   Gladstone Lighter, MD  traMADol (ULTRAM) 50 MG tablet Take 50 mg by mouth every 6 (six) hours as needed for moderate pain.     [provider]    Physical Exam: Vitals:   12/14/19 1000 12/14/19 1030 12/14/19 1200 12/14/19 1230  BP: (!) 193/57 (!) 165/59 (!) 155/108 (!) 149/68  Pulse: (!) 103 (!) 121 86 89  Resp: 14 (!) 32 (!) 23 (!) 30  Temp:      TempSrc:      SpO2: 94% 94% (!) 88% 95%  Weight:       General: Not in acute distress HEENT:       Eyes: PERRL, EOMI, no scleral icterus.       ENT: No discharge from the ears and nose, no pharynx injection, no tonsillar enlargement.        Neck: No JVD, no bruit, no mass felt. Heme: No neck lymph node enlargement. Cardiac: S1/S2, RRR, No murmurs, No gallops or rubs. Respiratory: No rales, wheezing,  rhonchi or rubs. GI: Soft, nondistended, nontender, no rebound pain, no organomegaly, BS present. GU: No hematuria Ext: 1+DP/PT pulse bilaterally.  Has chronic venous insufficiency change in both legs, has erythema in both lower legs.  The left lower leg is more tender, warmer and more erythematous. Musculoskeletal: No joint deformities, No joint redness or warmth, no limitation of ROM in spin. Skin: No rashes.  Neuro: Alert, oriented X3, cranial nerves II-XII grossly intact, moves all extremities normally. Psych: Patient is not psychotic, no suicidal or hemocidal ideation.  Labs on Admission: I have personally reviewed following labs and imaging studies  CBC: Recent Labs  Lab 12/14/19 0908  WBC 11.7*  NEUTROABS 10.6*  HGB 8.2*  HCT 26.6*  MCV 97.8  PLT 300   Basic Metabolic Panel: Recent Labs  Lab 12/14/19 0908  NA 141  K 4.1  CL 108  CO2 21*  GLUCOSE 111*  BUN 41*  CREATININE 2.77*  CALCIUM 8.5*   GFR: Estimated  Creatinine Clearance: 38.1 mL/min (A) (by C-G formula based on SCr of 2.77 mg/dL (H)). Liver Function Tests: Recent Labs  Lab 12/14/19 0908  AST 29  ALT 19  ALKPHOS 80  BILITOT 0.8  PROT 6.9  ALBUMIN 2.7*   No results for input(s): LIPASE, AMYLASE in the last 168 hours. Recent Labs  Lab 12/14/19 0908  AMMONIA 30   Coagulation Profile: No results for input(s): INR, PROTIME in the last 168 hours. Cardiac Enzymes: No results for input(s): CKTOTAL, CKMB, CKMBINDEX, TROPONINI in the last 168 hours. BNP (last 3 results) No results for input(s): PROBNP in the last 8760 hours. HbA1C: No results for input(s): HGBA1C in the last 72 hours. CBG: No results for input(s): GLUCAP in the last 168 hours. Lipid Profile: No results for input(s): CHOL, HDL, LDLCALC, TRIG, CHOLHDL, LDLDIRECT in the last 72 hours. Thyroid Function Tests: No results for input(s): TSH, T4TOTAL, FREET4, T3FREE, THYROIDAB in the last 72 hours. Anemia Panel: No results for  input(s): VITAMINB12, FOLATE, FERRITIN, TIBC, IRON, RETICCTPCT in the last 72 hours. Urine analysis:    Component Value Date/Time   COLORURINE YELLOW (A) 03/14/2019 2210   APPEARANCEUR HAZY (A) 03/14/2019 2210   APPEARANCEUR Clear 09/10/2014 0456   LABSPEC 1.016 03/14/2019 2210   LABSPEC 1.016 09/10/2014 0456   PHURINE 5.0 03/14/2019 2210   GLUCOSEU NEGATIVE 03/14/2019 2210   GLUCOSEU Negative 09/10/2014 0456   HGBUR SMALL (A) 03/14/2019 2210   BILIRUBINUR NEGATIVE 03/14/2019 2210   BILIRUBINUR Negative 09/10/2014 0456   KETONESUR NEGATIVE 03/14/2019 2210   PROTEINUR 100 (A) 03/14/2019 2210   NITRITE NEGATIVE 03/14/2019 2210   LEUKOCYTESUR NEGATIVE 03/14/2019 2210   LEUKOCYTESUR Negative 09/10/2014 0456   Sepsis Labs: '@LABRCNTIP'$ (procalcitonin:4,lacticidven:4) )No results found for this or any previous visit (from the past 240 hour(s)).   Radiological Exams on Admission: CT Chest Wo Contrast  Result Date: 12/14/2019 CLINICAL DATA:  Cough, shortness of breath EXAM: CT CHEST WITHOUT CONTRAST TECHNIQUE: Multidetector CT imaging of the chest was performed following the standard protocol without IV contrast. COMPARISON:  X-ray 12/14/2019, abdominal MRI 11/11/2019 FINDINGS: Technical note: Examination degraded by respiratory motion artifact. Cardiovascular: Mild cardiomegaly. No pericardial effusion. Thoracic aorta nonaneurysmal. Minimal aortic atherosclerotic calcification. Prominent coronary artery calcifications. Mediastinum/Nodes: No axillary lymphadenopathy. Scattered mediastinal lymph nodes. No mediastinal lymphadenopathy by size criteria. Evaluation of the hilar structures is limited in the absence of intravenous contrast. Unremarkable thyroid, trachea, and esophagus. Lungs/Pleura: Trace left pleural effusion. No focal airspace consolidation. No pneumothorax. No evidence of edema. Upper Abdomen: Nodularity of the hepatic contour compatible with history of cirrhosis. Visualized portion of  the spleen appears enlarged. Small amount of ascites within the upper abdomen. Musculoskeletal: Degenerative changes within the thoracic spine. Exaggerated thoracic kyphosis. No acute osseous finding. Mild bilateral gynecomastia IMPRESSION: 1. Trace left pleural effusion. No focal airspace consolidation. 2. Nodularity of the hepatic contour compatible with history of cirrhosis. Splenomegaly. Small amount of ascites within the upper abdomen. 3. Coronary atherosclerosis. Electronically Signed   By: Davina Poke D.O.   On: 12/14/2019 11:28   DG Chest Portable 1 View  Result Date: 12/14/2019 CLINICAL DATA:  Cough and shortness of breath EXAM: PORTABLE CHEST 1 VIEW COMPARISON:  April 28, 2019 FINDINGS: There is no edema or airspace opacity. There is cardiomegaly with central pulmonary vascular congestion and mild pulmonary venous hypertension. No adenopathy. No bone lesions. IMPRESSION: Stable cardiomegaly with a degree of pulmonary vascular congestion. No frank edema or consolidation. No adenopathy. Electronically Signed   By:  Lowella Grip III M.D.   On: 12/14/2019 09:32     EKG: Independently reviewed.  Sinus rhythm, QTC 439, occasional PVC, nonspecific T wave change  Assessment/Plan Principal Problem:   Cellulitis of lower extremity Active Problems:   Gastroesophageal reflux disease   Iron deficiency anemia   Sepsis (HCC)   Acute renal failure superimposed on stage 3a chronic kidney disease (HCC)   HTN (hypertension)   Chronic diastolic CHF (congestive heart failure) (HCC)   Type II diabetes mellitus with renal manifestations (HCC)   Liver cirrhosis (HCC)   Anxiety   SOB (shortness of breath)   Elevated troponin   Sepsis due to cellulitis of lower extremity: Patient admits critical for sepsis with leukocytosis, tachycardia, tachypnea.  Elevated lactic acid.  Currently hemodynamically stable  - will admit to tele bed as inpt - Empiric antimicrobial treatment with Rocephin and  vanco - Blood cultures x 2  - ESR and CRP - will get Procalcitonin and trend lactic acid levels per sepsis protocol. - IVF: 500 L of NS bolus in ED (patient has congestive heart failure, limiting aggressive IV fluids treatment).  Cirrhosis ([place]): Mental status normal. Ammonia 30 -continue home lactulose - INR and PTT  Gastroesophageal reflux disease -Protonix  Iron deficiency anemia -Continue iron supplement  CKD (chronic kidney disease), stage IIIa: slightly worsening.  Baseline creatinine 2.0-2.5.  His creatinine is 2.77, BUN 41 -Follow-up renal function with BMP  HTN:   -Continue home medications: lasix,  which is also for CHF -hydralazine prn  Chronic diastolic CHF (congestive heart failure) ([place]): 2D echo on 10/16/2018 showed EF of 55-60% with grade 2 diastolic dysfunction.  pt has SOB.  Has bilateral lower leg edema, indicating posssible fluid overload.  Since patient has sepsis, elevated lactic acid and slightly worsening renal function, will not escalate diuresis. -continue home dose lasix  Type II diabetes mellitus with renal manifestations ([place]): Most recent A1c 7.5, poorly controled. Patient is taking 70/30 insulin at home -will decrease 70/30 insulin dose from 84 to 60 units twice daily -SSI  Elevated troponin: trop 22 -->191 -->539. No CP.  Possibly due to demand ischemia in the setting of a worsening renal function -Start Lipitor 20 mg daily -Will not start aspirin due to history of GI bleeding and esophageal varices -Trend troponin -Check A1c, FLP -Repeat EKG in the morning    Inpatient status:  # Patient requires inpatient status due to high intensity of service, high risk for further deterioration and high frequency of surveillance required.  I certify that at the point of admission it is my clinical judgment that the patient will require inpatient hospital care spanning beyond 2 midnights from the point of admission.  . This patient has multiple  chronic comorbidities including hypertension, hyperlipidemia, diabetes mellitus, chronic lower extremity venous insufficiency change, GERD, anxiety, iron deficiency anemia, liver cirrhosis, AVM, dCHF, GI bleeding, CKD-3a . Now patient has presenting with sepsis due to cellulitis of lower extremities, failed outpt oral antibiotics, also has shortness of breath, elevated troponin, worsening renal function . The worrisome physical exam findings include erythema, tenderness, warmth and swelling in both lower legs (left leg is worse).  . The initial radiographic and laboratory data are worrisome because of leukocytosis, worsening renal function, elevated lactic acid, elevated troponin . Current medical needs: please see my assessment and plan . Predictability of an adverse outcome (risk): Patient has multiple comorbidities as listed above. Now presents with sepsis due to cellulitis of lower extremities failed outpt oral antibiotics,  also has shortness of breath, elevated troponin, worsening renal function Patient's presentation is highly complicated.  Patient is at high risk of deteriorating.  Will need to be treated in hospital for at least 2 days.           DVT ppx: SQ Heparin     Code Status: Full code Family Communication: not done, no family member is at bed side.     Disposition Plan:  Anticipate discharge back to previous home environment Consults called:  none Admission status: Med-surg bed as inpt     Date of Service 12/14/2019    [facility]   If 7PM-7AM, please contact night-coverage [email or web address] 12/14/2019, 12:59 PM

## 2019-12-14 NOTE — ED Notes (Signed)
Dr Margo Aye had failed and pt was wet and soiled with a small amount of stool.  CLeaned pt up (pt has moisture breakdown and small decub on buttock).  Pt placed in new sheets

## 2019-12-14 NOTE — Consult Note (Signed)
PHARMACY -  BRIEF ANTIBIOTIC NOTE   Pharmacy has received consult(s) for Vancomycin from an ED provider.  The patient's profile has been reviewed for ht/wt/allergies/indication/available labs.    One time order(s) placed for Vancomycin 1g x1 for cellulitis  Further antibiotics/pharmacy consults should be ordered by admitting physician if indicated.                       Thank you, Rowland Lathe 12/14/2019  10:58 AM

## 2019-12-14 NOTE — ED Notes (Addendum)
Weight from pt home scale on 3/17 entered. Pt on bed without scale and unable to stand at this time r/t increased WOB. Urinal at bedside

## 2019-12-14 NOTE — ED Triage Notes (Signed)
Pt shob. Has been sleeping on multiple pillows X 3 weeks. Labored. No O2 at baseline.

## 2019-12-14 NOTE — ED Provider Notes (Signed)
[facility] Emergency Department Provider Note  ____________________________________________  Time seen: Approximately 9:07 AM  I have reviewed the triage vital signs and the nursing notes.   HISTORY  Chief Complaint Shortness of Breath    HPI Corey West is a 69 y.o. male with a history of cirrhosis diabetes CKD GERD venous stasis dermatitis CHF  who comes the ED complaining of shortness of breath, gradual onset and worsening for the past 2 weeks, associated with orthopnea, having to sleep on multiple pillows, worse with walking, better sitting upright.  Denies chest pain or belly pain.  He has chronic bilateral lower extremity edema, seeing wound care, had Unna boots placed 3 days ago.  Also just finished a course of antibiotics due to cellulitis of the left lower extremity, transitioned to Keflex on discharge March 1 from recent hospitalization.     Past Medical History:  Diagnosis Date  . Anemia   . Anxiety    controlled;   . Arthritis   . AVM (arteriovenous malformation) of stomach, acquired with hemorrhage   . Barrett's esophagus   . Chronic kidney disease    renal infufficiency  . Cirrhosis ([place])   . Depression    controlled;   Marland Kitchen Diabetes mellitus without complication ([place])    not controlled, taking insulin but sugar continues to run high;   . Edema   . Esophageal varices ([place])   . GAVE (gastric antral vascular ectasia)   . GERD (gastroesophageal reflux disease)   . History of hiatal hernia   . Hyperlipidemia   . Hypertension    controlled well;   . Liver cirrhosis ([place])   . Nephropathy, diabetic ([place])   . Obesity   . Pancytopenia ([place])   . Polyp, stomach    with chronic blood loss  . Sleep apnea    does not wear a cpap, Medicare would not pay for it   . Venous stasis dermatitis of both lower extremities   . Venous stasis of both lower extremities    with cellulitis     Patient Active Problem List   Diagnosis Date Noted  .  Unstageable pressure ulcer of sacral region ([place]) 11/28/2019  . Chronic diastolic CHF (congestive heart failure) ([place]) 11/27/2019  . Type II diabetes mellitus with renal manifestations ([place]) 11/27/2019  . Cellulitis of left lower extremity 11/27/2019  . Cellulitis of lower extremity 11/27/2019  . HCAP (healthcare-associated pneumonia) 02/03/2019  . Aphasia 01/14/2019  . Acute on chronic renal failure ([place]) 01/14/2019  . Malnutrition of moderate degree 12/03/2018  . C. difficile diarrhea 12/02/2018  . Ankle fracture, left, closed, initial encounter 10/15/2018  . Ankle fracture, right, closed, initial encounter 10/15/2018  . Ankle fracture 10/15/2018  . Pressure injury of skin 07/19/2018  . UTI (urinary tract infection) 07/18/2018  . CKD (chronic kidney disease), stage IIIa 01/02/2018  . 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 Goals of care, counseling/discussion   . Palliative care encounter   . Hepatic encephalopathy syndrome ([place]) 12/16/2017  . Hepatic encephalopathy ([place]) 10/18/2017  . Hypokalemia 10/09/2017  . Hyperglycemia 10/09/2017  . Symptomatic anemia 06/26/2017  . Sepsis ([place]) 06/12/2017  . Type 2 diabetes mellitus with diabetic nephropathy, with long-term current use of insulin ([place]) 05/29/2017  . Cellulitis in diabetic foot ([place]) 02/21/2017  . Cellulitis 02/21/2017  . GI bleed 07/20/2015  . Hematemesis 02/08/2015  . Cirrhosis ([place]) 02/08/2015  . Esophageal varices ([place]) 02/08/2015  . Gastroesophageal reflux disease 02/08/2015  . Hyperlipidemia 02/08/2015  .  Chronic venous stasis dermatitis of both lower extremities 10/06/2014  . Edema 05/25/2014  . Hypertension 05/25/2014  . Obesity 05/25/2014  . Sleep apnea 05/25/2014  . Barrett's esophagus 05/25/2014  . Pancytopenia ([place]) 05/25/2014  . Iron deficiency anemia 12/04/2013  . Thrombocytopenia ([place]) 12/04/2013  . Splenomegaly 11/04/2013     Past Surgical History:  Procedure Laterality Date  .  ESOPHAGOGASTRODUODENOSCOPY N/A 02/09/2015   Procedure: ESOPHAGOGASTRODUODENOSCOPY (EGD);  Surgeon: Manya Silvas, MD;  Location: [facility] ENDOSCOPY;  Service: Endoscopy;  Laterality: N/A;  . ESOPHAGOGASTRODUODENOSCOPY N/A 07/22/2015   Procedure: ESOPHAGOGASTRODUODENOSCOPY (EGD);  Surgeon: Manya Silvas, MD;  Location: [facility] ENDOSCOPY;  Service: Endoscopy;  Laterality: N/A;  . ESOPHAGOGASTRODUODENOSCOPY N/A 06/26/2017   Procedure: ESOPHAGOGASTRODUODENOSCOPY (EGD);  Surgeon: Manya Silvas, MD;  Location: [facility] ENDOSCOPY;  Service: Endoscopy;  Laterality: N/A;  . ESOPHAGOGASTRODUODENOSCOPY N/A 06/27/2017   Procedure: ESOPHAGOGASTRODUODENOSCOPY (EGD);  Surgeon: Manya Silvas, MD;  Location: [facility] ENDOSCOPY;  Service: Endoscopy;  Laterality: N/A;  . ESOPHAGOGASTRODUODENOSCOPY N/A 06/28/2017   Procedure: ESOPHAGOGASTRODUODENOSCOPY (EGD);  Surgeon: Manya Silvas, MD;  Location: [facility] ENDOSCOPY;  Service: Endoscopy;  Laterality: N/A;  . ESOPHAGOGASTRODUODENOSCOPY Bilateral 10/11/2017   Procedure: ESOPHAGOGASTRODUODENOSCOPY (EGD);  Surgeon: Manya Silvas, MD;  Location: [facility] ENDOSCOPY;  Service: Endoscopy;  Laterality: Bilateral;  . ESOPHAGOGASTRODUODENOSCOPY N/A 12/04/2017   Procedure: ESOPHAGOGASTRODUODENOSCOPY (EGD);  Surgeon: Manya Silvas, MD;  Location: [facility] ENDOSCOPY;  Service: Endoscopy;  Laterality: N/A;  . ESOPHAGOGASTRODUODENOSCOPY N/A 03/06/2019   Procedure: ESOPHAGOGASTRODUODENOSCOPY (EGD);  Surgeon: [place], Benay Pike, MD;  Location: [facility] ENDOSCOPY;  Service: Gastroenterology;  Laterality: N/A;  . ESOPHAGOGASTRODUODENOSCOPY (EGD) WITH PROPOFOL N/A 07/20/2015   Procedure: ESOPHAGOGASTRODUODENOSCOPY (EGD) WITH PROPOFOL;  Surgeon: Manya Silvas, MD;  Location: [facility] ENDOSCOPY;  Service: Endoscopy;  Laterality: N/A;  . ESOPHAGOGASTRODUODENOSCOPY (EGD) WITH PROPOFOL N/A 09/16/2015   Procedure: ESOPHAGOGASTRODUODENOSCOPY (EGD) WITH PROPOFOL;  Surgeon: Manya Silvas, MD;  Location: [facility]  ENDOSCOPY;  Service: Endoscopy;  Laterality: N/A;  . ESOPHAGOGASTRODUODENOSCOPY (EGD) WITH PROPOFOL N/A 03/16/2016   Procedure: ESOPHAGOGASTRODUODENOSCOPY (EGD) WITH PROPOFOL;  Surgeon: Manya Silvas, MD;  Location: [facility] ENDOSCOPY;  Service: Endoscopy;  Laterality: N/A;  . ESOPHAGOGASTRODUODENOSCOPY (EGD) WITH PROPOFOL N/A 09/14/2016   Procedure: ESOPHAGOGASTRODUODENOSCOPY (EGD) WITH PROPOFOL;  Surgeon: Manya Silvas, MD;  Location: [facility] ENDOSCOPY;  Service: Endoscopy;  Laterality: N/A;  . ESOPHAGOGASTRODUODENOSCOPY (EGD) WITH PROPOFOL N/A 11/05/2016   Procedure: ESOPHAGOGASTRODUODENOSCOPY (EGD) WITH PROPOFOL;  Surgeon: Manya Silvas, MD;  Location: [facility] ENDOSCOPY;  Service: Endoscopy;  Laterality: N/A;  . ESOPHAGOGASTRODUODENOSCOPY (EGD) WITH PROPOFOL N/A 02/06/2017   Procedure: ESOPHAGOGASTRODUODENOSCOPY (EGD) WITH PROPOFOL;  Surgeon: Manya Silvas, MD;  Location: [facility] ENDOSCOPY;  Service: Endoscopy;  Laterality: N/A;  . ESOPHAGOGASTRODUODENOSCOPY (EGD) WITH PROPOFOL N/A 04/24/2017   Procedure: ESOPHAGOGASTRODUODENOSCOPY (EGD) WITH PROPOFOL;  Surgeon: Manya Silvas, MD;  Location: [facility] ENDOSCOPY;  Service: Endoscopy;  Laterality: N/A;  . ESOPHAGOGASTRODUODENOSCOPY (EGD) WITH PROPOFOL N/A 07/31/2017   Procedure: ESOPHAGOGASTRODUODENOSCOPY (EGD) WITH PROPOFOL;  Surgeon: Manya Silvas, MD;  Location: [facility] ENDOSCOPY;  Service: Endoscopy;  Laterality: N/A;  . ESOPHAGOGASTRODUODENOSCOPY (EGD) WITH PROPOFOL N/A 08/08/2018   Procedure: ESOPHAGOGASTRODUODENOSCOPY (EGD) WITH PROPOFOL;  Surgeon: Manya Silvas, MD;  Location: [facility] ENDOSCOPY;  Service: Endoscopy;  Laterality: N/A;  . GIVENS CAPSULE STUDY N/A 12/05/2017   Procedure: GIVENS CAPSULE STUDY;  Surgeon: Manya Silvas, MD;  Location: [facility] ENDOSCOPY;  Service: Endoscopy;  Laterality: N/A;  . TONSILLECTOMY    . TONSILLECTOMY AND ADENOIDECTOMY    . ULNAR NERVE TRANSPOSITION    .  UVULOPALATOPHARYNGOPLASTY       Prior to  Admission medications   Medication Sig Start Date End Date Taking? Authorizing Provider  cetirizine (ZYRTEC) 10 MG tablet Take 10 mg by mouth daily.   Yes [provider]  ferrous sulfate 325 (65 FE) MG tablet Take 325 mg by mouth 2 (two) times daily with a meal.    Yes [provider]  furosemide (LASIX) 40 MG tablet Take 80 mg by mouth 2 (two) times a day.    Yes [provider]  insulin aspart protamine - aspart (NOVOLOG 70/30 MIX) (70-30) 100 UNIT/ML FlexPen Inject 84 Units into the skin 2 (two) times daily.    Yes [provider]  lactulose (CHRONULAC) 10 GM/15ML solution Take 30 g by mouth 4 (four) times daily.    Yes [provider]  LORazepam (ATIVAN) 0.5 MG tablet Take 1 tablet (0.5 mg total) by mouth every 8 (eight) hours as needed for anxiety. 10/18/18  Yes Gladstone Lighter, MD  Multiple Vitamin (MULTIVITAMIN) tablet Take 1 tablet by mouth daily.   Yes [provider]  pantoprazole (PROTONIX) 40 MG tablet Take 1 tablet (40 mg total) by mouth 2 (two) times daily. 03/06/19 03/05/20 Yes Dustin Flock, MD  Potassium Chloride ER 20 MEQ TBCR Take 20 mEq by mouth 2 (two) times daily. While on lasix' Patient taking differently: Take 20 mEq by mouth 2 (two) times daily.  10/18/18   Gladstone Lighter, MD  traMADol (ULTRAM) 50 MG tablet Take 50 mg by mouth every 6 (six) hours as needed for moderate pain.     [provider]     Allergies Patient has no known allergies.   Family History  Problem Relation Age of Onset  . Diabetes Other   . Transient ischemic attack Father   . CAD Father     Social History Social History   Tobacco Use  . Smoking status: Former Smoker    Years: 20.00    Types: Cigars, Cigarettes  . Smokeless tobacco: Never Used  Substance Use Topics  . Alcohol use: No    Comment: stopped 15 years ago  . Drug use: No    Review of Systems  Constitutional:   No fever or chills.  ENT:   No sore throat.  No rhinorrhea. Cardiovascular:   No chest pain or syncope. Respiratory:   Positive shortness of breath and cough. Gastrointestinal:   Negative for abdominal pain, vomiting and diarrhea.  Musculoskeletal:   Positive chronic leg swelling All other systems reviewed and are negative except as documented above in ROS and HPI.  ____________________________________________   PHYSICAL EXAM:  VITAL SIGNS: ED Triage Vitals [12/14/19 0859]  Enc Vitals Group     BP (!) 164/113     Pulse Rate (!) 112     Resp (!) 29     Temp      Temp src      SpO2 96 %     Weight      Height      Head Circumference      Peak Flow      Pain Score 0     Pain Loc      Pain Edu?      Excl. in [place]?     Vital signs reviewed, nursing assessments reviewed.   Constitutional:   Alert and oriented. Non-toxic appearance.  Morbidly obese Eyes:   Conjunctivae are normal. EOMI. PERRL. ENT      Head:   Normocephalic and  atraumatic.      Nose:   Wearing a mask.      Mouth/Throat:   Wearing a mask.      Neck:   No meningismus. Full ROM. Hematological/Lymphatic/Immunilogical:   No cervical lymphadenopathy. Cardiovascular:   Irregularly irregular rhythm, rate 100-110. Symmetric bilateral radial and DP pulses.  No murmurs. Cap refill less than 2 seconds. Respiratory: Tachypnea.  Diminished breath sounds bilateral bases.  Faint expiratory wheeze diffusely Gastrointestinal:   Soft and nontender. Non distended. There is no CVA tenderness.  No rebound, rigidity, or guarding. Musculoskeletal:   Normal range of motion in all extremities. No joint effusions.  Brawny skin thickening bilateral lower extremities.  Diffuse erythema of left lower extremity with tenderness and warmth over the left superior calf.  Neurologic:   Normal speech and language.  Motor grossly intact. No acute focal neurologic deficits are appreciated.  Skin:    Skin is warm, dry with inflammatory and chronic changes as  above  ____________________________________________    LABS (pertinent positives/negatives) (all labs ordered are listed, but only abnormal results are displayed) Labs Reviewed  COMPREHENSIVE METABOLIC PANEL - Abnormal; Notable for the following components:      Result Value   CO2 21 (*)    Glucose, Bld 111 (*)    BUN 41 (*)    Creatinine, Ser 2.77 (*)    Calcium 8.5 (*)    Albumin 2.7 (*)    GFR calc non Af Amer 23 (*)    GFR calc Af Amer 26 (*)    All other components within normal limits  BRAIN NATRIURETIC PEPTIDE - Abnormal; Notable for the following components:   B Natriuretic Peptide 131.0 (*)    All other components within normal limits  CBC WITH DIFFERENTIAL/PLATELET - Abnormal; Notable for the following components:   WBC 11.7 (*)    RBC 2.72 (*)    Hemoglobin 8.2 (*)    HCT 26.6 (*)    RDW 16.6 (*)    Neutro Abs 10.6 (*)    Lymphs Abs 0.6 (*)    All other components within normal limits  LACTIC ACID, PLASMA - Abnormal; Notable for the following components:   Lactic Acid, Venous 2.3 (*)    All other components within normal limits  TROPONIN I (HIGH SENSITIVITY) - Abnormal; Notable for the following components:   Troponin I (High Sensitivity) 22 (*)    All other components within normal limits  CULTURE, BLOOD (SINGLE)  SARS CORONAVIRUS 2 (TAT 6-24 HRS)  AMMONIA  LACTIC ACID, PLASMA  LACTIC ACID, PLASMA  PROCALCITONIN  TROPONIN I (HIGH SENSITIVITY)   ____________________________________________   EKG  Interpreted by me Atrial fibrillation, rate of 109.  Normal axis and intervals.  Normal QRS ST segments and T waves.  Baseline wander.  No ischemic changes.  ____________________________________________    RADIOLOGY  CT Chest Wo Contrast  Result Date: 12/14/2019 CLINICAL DATA:  Cough, shortness of breath EXAM: CT CHEST WITHOUT CONTRAST TECHNIQUE: Multidetector CT imaging of the chest was performed following the standard protocol without IV contrast.  COMPARISON:  X-ray 12/14/2019, abdominal MRI 11/11/2019 FINDINGS: Technical note: Examination degraded by respiratory motion artifact. Cardiovascular: Mild cardiomegaly. No pericardial effusion. Thoracic aorta nonaneurysmal. Minimal aortic atherosclerotic calcification. Prominent coronary artery calcifications. Mediastinum/Nodes: No axillary lymphadenopathy. Scattered mediastinal lymph nodes. No mediastinal lymphadenopathy by size criteria. Evaluation of the hilar structures is limited in the absence of intravenous contrast. Unremarkable thyroid, trachea, and esophagus. Lungs/Pleura: Trace left pleural effusion. No focal airspace consolidation. No  pneumothorax. No evidence of edema. Upper Abdomen: Nodularity of the hepatic contour compatible with history of cirrhosis. Visualized portion of the spleen appears enlarged. Small amount of ascites within the upper abdomen. Musculoskeletal: Degenerative changes within the thoracic spine. Exaggerated thoracic kyphosis. No acute osseous finding. Mild bilateral gynecomastia IMPRESSION: 1. Trace left pleural effusion. No focal airspace consolidation. 2. Nodularity of the hepatic contour compatible with history of cirrhosis. Splenomegaly. Small amount of ascites within the upper abdomen. 3. Coronary atherosclerosis. Electronically Signed   By: Davina Poke D.O.   On: 12/14/2019 11:28   DG Chest Portable 1 View  Result Date: 12/14/2019 CLINICAL DATA:  Cough and shortness of breath EXAM: PORTABLE CHEST 1 VIEW COMPARISON:  April 28, 2019 FINDINGS: There is no edema or airspace opacity. There is cardiomegaly with central pulmonary vascular congestion and mild pulmonary venous hypertension. No adenopathy. No bone lesions. IMPRESSION: Stable cardiomegaly with a degree of pulmonary vascular congestion. No frank edema or consolidation. No adenopathy. Electronically Signed   By: Lowella Grip III M.D.   On: 12/14/2019 09:32     ____________________________________________   PROCEDURES .Critical Care Performed by: Carrie Mew, MD Authorized by: Carrie Mew, MD   Critical care provider statement:    Critical care time (minutes):  35   Critical care time was exclusive of:  Separately billable procedures and treating other patients   Critical care was necessary to treat or prevent imminent or life-threatening deterioration of the following conditions:  Sepsis   Critical care was time spent personally by me on the following activities:  Development of treatment plan with patient or surrogate, discussions with consultants, evaluation of patient's response to treatment, examination of patient, obtaining history from patient or surrogate, ordering and performing treatments and interventions, ordering and review of laboratory studies, ordering and review of radiographic studies, pulse oximetry, re-evaluation of patient's condition and review of old charts    ____________________________________________  DIFFERENTIAL DIAGNOSIS   Congestive heart failure exacerbation, lymphedema, persistent lower extremity cellulitis, less likely pneumonia or DVT.  Doubt intra-abdominal pathology, ACS, dissection, PE  CLINICAL IMPRESSION / ASSESSMENT AND PLAN / ED COURSE  Medications ordered in the ED: Medications  vancomycin (VANCOCIN) IVPB 1000 mg/200 mL premix (1,000 mg Intravenous New Bag/Given 12/14/19 1211)  cefTRIAXone (ROCEPHIN) 2 g in sodium chloride 0.9 % 100 mL IVPB (2 g Intravenous New Bag/Given 12/14/19 1213)  furosemide (LASIX) injection 80 mg (80 mg Intravenous Given 12/14/19 0926)  sodium chloride 0.9 % bolus 500 mL (500 mLs Intravenous New Bag/Given 12/14/19 1210)    Pertinent labs & imaging results that were available during my care of the patient were reviewed by me and considered in my medical decision making (see chart for details).  Corey West was evaluated in Emergency Department on 12/14/2019  for the symptoms described in the history of present illness. He was evaluated in the context of the global COVID-19 pandemic, which necessitated consideration that the patient might be at risk for infection with the SARS-CoV-2 virus that causes COVID-19. Institutional protocols and algorithms that pertain to the evaluation of patients at risk for COVID-19 are in a state of rapid change based on information released by regulatory bodies including the CDC and federal and state organizations. These policies and algorithms were followed during the patient's care in the ED.     Clinical Course as of Dec 13 1257  Mon Dec 14, 2019  0906 Pt p/w cough, sob, leg swelling.  Exam is c/w CHF / pulm edema or  pleural effusion. Tachycardia is due to a-fib which is rate controlled and appropriate. Doubt sepsis on initial assessment.     [PS]  2353 Chest x-ray unremarkable, labs show stable CKD, stable chronic anemia.  Cardiac markers are unremarkable given his underlying comorbidities.  Remains tachycardic and tachypneic with elevated blood pressure.  Lactate elevated at 2.3.  Symptoms could possibly related to Covid.  I will obtain a CT angiogram of the chest for further evaluation of possible PE or Covid pneumonitis.  Evolving clinical picture raises concern for possible sepsis due to left leg cellulitis and outpatient treatment failure.  I will start antibiotics with ceftriaxone and vancomycin..   [PS]  6144 Unable to obtain contrast-enhanced CT scan due to GFR less than 30.  I will obtain a noncontrast CT chest to evaluate for occult airspace disease.   [PS]  1136 CT chest negative for airspace disease.  Will need to obtain VQ scan to evaluate for PE.  We will plan to admit for refractory cellulitis with suspicion of developing early sepsis.   [PS]  3154 Given lack of pulmonary edema on CT scan, I will give IV fluids due to the elevated lactic acid level.   [PS]  1239 Repeat lactate normalized. No signs of  septic shock   [PS]    Clinical Course User Index [PS] Carrie Mew, MD     ____________________________________________   FINAL CLINICAL IMPRESSION(S) / ED DIAGNOSES    Final diagnoses:  Left leg cellulitis  Failure of outpatient treatment  Hepatic cirrhosis, unspecified hepatic cirrhosis type, unspecified whether ascites present ([place])  Type 2 diabetes mellitus without complication, with long-term current use of insulin ([place])  CKD (chronic kidney disease) stage 4, GFR 15-29 ml/min (HCC)  Morbid obesity ([place])  Sepsis, due to unspecified organism, unspecified whether acute organ dysfunction present [facility])     ED Discharge Orders    None      Portions of this note were generated with dragon dictation software. Dictation errors may occur despite best attempts at proofreading.   Carrie Mew, MD 12/15/19 1544

## 2019-12-14 NOTE — ED Notes (Signed)
Date and time results received: 03/15/211730  Test: Troponin Critical Value: [place] Name of Provider Notified: Blaine Hamper

## 2019-12-14 NOTE — ED Notes (Signed)
Taken to CT.

## 2019-12-15 DIAGNOSIS — K7469 Other cirrhosis of liver: Secondary | ICD-10-CM

## 2019-12-15 LAB — LIPID PANEL
Cholesterol: 141 mg/dL (ref 0–200)
HDL: 37 mg/dL — ABNORMAL LOW (ref >40)
LDL Cholesterol: 93 mg/dL (ref 0–99)
Total CHOL/HDL Ratio: 3.8 RATIO
Triglycerides: 56 mg/dL (ref <150)
VLDL: 11 mg/dL (ref 0–40)

## 2019-12-15 LAB — CBC
HCT: 24 % — ABNORMAL LOW (ref 39.0–52.0)
Hemoglobin: 7.5 g/dL — ABNORMAL LOW (ref 13.0–17.0)
MCH: 30.5 pg (ref 26.0–34.0)
MCHC: 31.3 g/dL (ref 30.0–36.0)
MCV: 97.6 fL (ref 80.0–100.0)
Platelets: 109 10*3/uL — ABNORMAL LOW (ref 150–400)
RBC: 2.46 MIL/uL — ABNORMAL LOW (ref 4.22–5.81)
RDW: 16.2 % — ABNORMAL HIGH (ref 11.5–15.5)
WBC: 11.2 10*3/uL — ABNORMAL HIGH (ref 4.0–10.5)
nRBC: 0 % (ref 0.0–0.2)

## 2019-12-15 LAB — GLUCOSE, CAPILLARY
Glucose-Capillary: 147 mg/dL — ABNORMAL HIGH (ref 70–99)
Glucose-Capillary: 165 mg/dL — ABNORMAL HIGH (ref 70–99)
Glucose-Capillary: 149 mg/dL — ABNORMAL HIGH (ref 70–99)
Glucose-Capillary: 187 mg/dL — ABNORMAL HIGH (ref 70–99)
Glucose-Capillary: 198 mg/dL — ABNORMAL HIGH (ref 70–99)
Glucose-Capillary: 133 mg/dL — ABNORMAL HIGH (ref 70–99)

## 2019-12-15 LAB — BASIC METABOLIC PANEL
Anion gap: 9 (ref 5–15)
BUN: 45 mg/dL — ABNORMAL HIGH (ref 8–23)
CO2: 22 mmol/L (ref 22–32)
Calcium: 8.3 mg/dL — ABNORMAL LOW (ref 8.9–10.3)
Chloride: 111 mmol/L (ref 98–111)
Creatinine, Ser: 2.68 mg/dL — ABNORMAL HIGH (ref 0.61–1.24)
GFR calc Af Amer: 27 mL/min — ABNORMAL LOW (ref >60)
GFR calc non Af Amer: 24 mL/min — ABNORMAL LOW (ref >60)
Glucose, Bld: 183 mg/dL — ABNORMAL HIGH (ref 70–99)
Potassium: 4.1 mmol/L (ref 3.5–5.1)
Sodium: 142 mmol/L (ref 135–145)

## 2019-12-15 LAB — HEMOGLOBIN A1C
Hgb A1c MFr Bld: 6.6 % — ABNORMAL HIGH (ref 4.8–5.6)
Mean Plasma Glucose: 142.72 mg/dL

## 2019-12-15 NOTE — Consult Note (Signed)
WOC Nurse Consult Note: Reason for Consult:cellulits with history of edema management with Unna's boots Wound type: Venous stasis with venous dermatitis. However, the LLE is extremely erythematous up into the left inner thigh.   I do not feel comfortable with applying an Unna's boot to this extremity at this time. Told the patient I will follow up after her receives IV antibiotics for at least 24-48 hours  Pressure Injury POA: NA Measurement:no open wounds Wound bed: no open wounds Drainage (amount, consistency, odor) no drainage Periwound: NA Dressing procedure/placement/frequency: Applied Unna's boot to the RLE; patient tolerated without problems Will watch LLE tomorrow and plan to place Unna's boot on the LLE if redness has improved.   Will change bilateral Unna's boots again on Friday to get back on the schedule for [facility].  Unsure if [facility] will continue to come or manage without open wounds.  Patient is scheduled with wound care center for follow up next Monday.   [place] Nurse team will follow with you.Marland Kitchen  Please notify [place] nurses of any acute changes in the wounds or any new areas of concern Salome MSN, [place], [place], [place]

## 2019-12-15 NOTE — Progress Notes (Signed)
PHARMACY - PHYSICIAN COMMUNICATION CRITICAL VALUE ALERT - BLOOD CULTURE IDENTIFICATION (BCID)  Corey West is an 69 y.o. male who presented to [facility] on 12/14/2019 with a chief complaint of cellulitis  Assessment:  Lab reports 1 of 2 bottles w/ GNR, Klebsiella oxytoca, KPC not detected  Name of physician (or Provider) ContactedRachael Fee, NP  Current antibiotics: Rocephin and Vancomycin  Changes to prescribed antibiotics recommended:  Patient is on recommended antibiotics - No changes needed  Results for orders placed or performed during the hospital encounter of 12/14/19  Blood Culture ID Panel (Reflexed) (Collected: 12/14/2019  9:05 AM)  Result Value Ref Range   Enterococcus species NOT DETECTED NOT DETECTED   Listeria monocytogenes NOT DETECTED NOT DETECTED   Staphylococcus species NOT DETECTED NOT DETECTED   Staphylococcus aureus (BCID) NOT DETECTED NOT DETECTED   Streptococcus species NOT DETECTED NOT DETECTED   Streptococcus agalactiae NOT DETECTED NOT DETECTED   Streptococcus pneumoniae NOT DETECTED NOT DETECTED   Streptococcus pyogenes NOT DETECTED NOT DETECTED   Acinetobacter baumannii NOT DETECTED NOT DETECTED   Enterobacteriaceae species DETECTED (A) NOT DETECTED   Enterobacter cloacae complex NOT DETECTED NOT DETECTED   Escherichia coli NOT DETECTED NOT DETECTED   Klebsiella oxytoca DETECTED (A) NOT DETECTED   Klebsiella pneumoniae NOT DETECTED NOT DETECTED   Proteus species NOT DETECTED NOT DETECTED   Serratia marcescens NOT DETECTED NOT DETECTED   Carbapenem resistance NOT DETECTED NOT DETECTED   Haemophilus influenzae NOT DETECTED NOT DETECTED   Neisseria meningitidis NOT DETECTED NOT DETECTED   Pseudomonas aeruginosa NOT DETECTED NOT DETECTED   Candida albicans NOT DETECTED NOT DETECTED   Candida glabrata NOT DETECTED NOT DETECTED   Candida krusei NOT DETECTED NOT DETECTED   Candida parapsilosis NOT DETECTED NOT DETECTED   Candida tropicalis NOT  DETECTED NOT DETECTED    Hart Robinsons A 12/15/2019  12:27 AM

## 2019-12-15 NOTE — Progress Notes (Signed)
PROGRESS NOTE    Corey West  CBJ:[id number] DOB: 10-22-1950 DOA: 12/14/2019 PCP: Baxter Hire, MD      Assessment & Plan:   Principal Problem:   Cellulitis of lower extremity Active Problems:   Gastroesophageal reflux disease   Iron deficiency anemia   Sepsis ([place])   Acute renal failure superimposed on stage 3a chronic kidney disease (HCC)   HTN (hypertension)   Chronic diastolic CHF (congestive heart failure) (HCC)   Type II diabetes mellitus with renal manifestations (HCC)   Liver cirrhosis (HCC)   Anxiety   SOB (shortness of breath)   Elevated troponin   Sepsis: due to cellulitis of left lower extremity. Continue on IV rocephin & vanco. Blood cx are pending. Lactic acid is WNL. Pro-cal 2.66. CRP is elevated.   Left lower extremity cellulitis: w/ lymphedema. Continue on IV rocephin & vanco. Wound care consulted.   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 NOT to alcohol use/abuse. On lactulose but hold doses today and restart tomorrow 12/16/19  GERD: continue on PPI   Normocytic anemia: likely secondary to cirrhosis. No need for a transfusion at this time. Will continue to monitor   Thrombocytopenia: likely secondary to cirrhosis. Will continue to monitor   CKDIIIa:Baseline creatinine 2.0-2.5. Cr is trending down from day prior. Will continue to monitor   HTN:  continue on home dose oflasix. Hydralazine prn  Chronic diastolic HYW:VPXT on 0/62/6948 showed EF of 55-60% with grade 2 diastolic dysfunction. Continue home dose lasix  DM2:most recent A1c7.5. Will continue on SSI w/ accuchecks   Elevated troponin: possibly due to demand ischemia. No CP.  Morbid obesity: BMI 44.1. Would greatly benefit from weight loss  Generalized weakness: PT/OT consulted  DVT prophylaxis: heparin  Code Status: full  Family Communication:  Disposition Plan: depends on PT/OT recs    Consultants:     Procedures:    Antimicrobials: vanco &  rocephin   Subjective: Pt c/o LLE pain   Objective: Vitals:   12/14/19 1801 12/14/19 2332 12/15/19 0500 12/15/19 0824  BP: (!) 163/58 124/66  (!) 155/60  Pulse: 73 60  73  Resp: 18 17    Temp: 98.5 F (36.9 C) 98.1 F (36.7 C)  97.9 F (36.6 C)  TempSrc: Oral Oral  Oral  SpO2: 100% 96%  98%  Weight:   (!) 147.5 kg     Intake/Output Summary (Last 24 hours) at 12/15/2019 0828 Last data filed at 12/15/2019 0600 Gross per 24 hour  Intake 1020.56 ml  Output 275 ml  Net 745.56 ml   Filed Weights   12/14/19 0917 12/15/19 0500  Weight: (!) 143.8 kg (!) 147.5 kg    Examination:  General exam: Appears calm but uncomfortable  Respiratory system: diminished breath sounds b/l. No wheezes Cardiovascular system: S1 & S2 +. No rubs, gallops or clicks. B/l LE edema, lympedema of b/l LE Gastrointestinal system: Abdomen is obese, soft and nontender. Hyperactive bowel sounds heard. Central nervous system: Alert and oriented. Moves all 4 extremities Psychiatry: Judgement and insight appear normal. Flat mood and affect  Skin: b/l LE lymphedema, significantly dry skin; LLE erythema, warmth to palpation & tenderness to palpation     Data Reviewed: I have personally reviewed following labs and imaging studies  CBC: Recent Labs  Lab 12/14/19 0908 12/15/19 0521  WBC 11.7* 11.2*  NEUTROABS 10.6*  --   HGB 8.2* 7.5*  HCT 26.6* 24.0*  MCV 97.8 97.6  PLT 172 546*   Basic Metabolic Panel: Recent Labs  Lab 12/14/19 0908 12/15/19 0521  NA 141 142  K 4.1 4.1  CL 108 111  CO2 21* 22  GLUCOSE 111* 183*  BUN 41* 45*  CREATININE 2.77* 2.68*  CALCIUM 8.5* 8.3*   GFR: Estimated Creatinine Clearance: 40 mL/min (A) (by C-G formula based on SCr of 2.68 mg/dL (H)). Liver Function Tests: Recent Labs  Lab 12/14/19 0908  AST 29  ALT 19  ALKPHOS 80  BILITOT 0.8  PROT 6.9  ALBUMIN 2.7*   No results for input(s): LIPASE, AMYLASE in the last 168 hours. Recent Labs  Lab  12/14/19 0908  AMMONIA 30   Coagulation Profile: Recent Labs  Lab 12/14/19 1922  INR 1.4*   Cardiac Enzymes: No results for input(s): CKTOTAL, CKMB, CKMBINDEX, TROPONINI in the last 168 hours. BNP (last 3 results) No results for input(s): PROBNP in the last 8760 hours. HbA1C: No results for input(s): HGBA1C in the last 72 hours. CBG: Recent Labs  Lab 12/14/19 1827 12/14/19 2033 12/15/19 0146 12/15/19 0401  GLUCAP 128* 137* 147* 165*   Lipid Profile: Recent Labs    12/15/19 0521  CHOL 141  HDL 37*  LDLCALC 93  TRIG 56  CHOLHDL 3.8   Thyroid Function Tests: No results for input(s): TSH, T4TOTAL, FREET4, T3FREE, THYROIDAB in the last 72 hours. Anemia Panel: No results for input(s): VITAMINB12, FOLATE, FERRITIN, TIBC, IRON, RETICCTPCT in the last 72 hours. Sepsis Labs: Recent Labs  Lab 12/14/19 0908 12/14/19 1206 12/14/19 1922 12/14/19 2218  PROCALCITON  --  2.66  --   --   LATICACIDVEN 2.3* 1.4 1.8 1.6    Recent Results (from the past 240 hour(s))  Blood culture (single)     Status: None (Preliminary result)   Collection Time: 12/14/19  9:05 AM   Specimen: BLOOD  Result Value Ref Range Status   Specimen Description   Final    BLOOD RIGHT FA Performed at [facility], [address] [place], [place] [address]    Special Requests   Final    BOTTLES DRAWN AEROBIC AND ANAEROBIC Blood Culture adequate volume Performed at [facility], [address] [place], [place] [address]    Culture  Setup Time   Final    AEROBIC BOTTLE ONLY GRAM NEGATIVE RODS CRITICAL RESULT CALLED TO, READ BACK BY AND VERIFIED WITH: SCOTT HALL PHARMD 2344 12/14/19 HNM    Culture GRAM NEGATIVE RODS  Final   Report Status PENDING  Incomplete  Blood Culture ID Panel (Reflexed)     Status: Abnormal   Collection Time: 12/14/19  9:05 AM  Result Value Ref Range Status   Enterococcus species NOT DETECTED NOT DETECTED Final   Listeria monocytogenes NOT DETECTED NOT  DETECTED Final   Staphylococcus species NOT DETECTED NOT DETECTED Final   Staphylococcus aureus (BCID) NOT DETECTED NOT DETECTED Final   Streptococcus species NOT DETECTED NOT DETECTED Final   Streptococcus agalactiae NOT DETECTED NOT DETECTED Final   Streptococcus pneumoniae NOT DETECTED NOT DETECTED Final   Streptococcus pyogenes NOT DETECTED NOT DETECTED Final   Acinetobacter baumannii NOT DETECTED NOT DETECTED Final   Enterobacteriaceae species DETECTED (A) NOT DETECTED Final    Comment: Enterobacteriaceae represent a large family of gram-negative bacteria, not a single organism. CRITICAL RESULT CALLED TO, READ BACK BY AND VERIFIED WITH: SCOTT HALL PHARMD 2344 12/14/19 HNM    Enterobacter cloacae complex NOT DETECTED NOT DETECTED Final   Escherichia coli NOT DETECTED NOT DETECTED Final   Klebsiella oxytoca DETECTED (A) NOT DETECTED  Final    Comment: CRITICAL RESULT CALLED TO, READ BACK BY AND VERIFIED WITH: SCOTT HALL PHARMD 2344 12/14/19 HNM    Klebsiella pneumoniae NOT DETECTED NOT DETECTED Final   Proteus species NOT DETECTED NOT DETECTED Final   Serratia marcescens NOT DETECTED NOT DETECTED Final   Carbapenem resistance NOT DETECTED NOT DETECTED Final   Haemophilus influenzae NOT DETECTED NOT DETECTED Final   Neisseria meningitidis NOT DETECTED NOT DETECTED Final   Pseudomonas aeruginosa NOT DETECTED NOT DETECTED Final   Candida albicans NOT DETECTED NOT DETECTED Final   Candida glabrata NOT DETECTED NOT DETECTED Final   Candida krusei NOT DETECTED NOT DETECTED Final   Candida parapsilosis NOT DETECTED NOT DETECTED Final   Candida tropicalis NOT DETECTED NOT DETECTED Final    Comment: Performed at [facility], [place], [place] [address]  SARS CORONAVIRUS 2 (TAT 6-24 HRS) Nasopharyngeal Nasopharyngeal Swab     Status: None   Collection Time: 12/14/19  9:29 AM   Specimen: Nasopharyngeal Swab  Result Value Ref Range Status   SARS Coronavirus 2  NEGATIVE NEGATIVE Final    Comment: (NOTE) SARS-CoV-2 target nucleic acids are NOT DETECTED. The SARS-CoV-2 RNA is generally detectable in upper and lower respiratory specimens during the acute phase of infection. Negative results do not preclude SARS-CoV-2 infection, do not rule out co-infections with other pathogens, and should not be used as the sole basis for treatment or other patient management decisions. Negative results must be combined with clinical observations, patient history, and epidemiological information. The expected result is Negative. Fact Sheet for Patients: SugarRoll.be Fact Sheet for Healthcare Providers: https://www.woods-mathews.com/ This test is not yet approved or cleared by the [place] FDA and  has been authorized for detection and/or diagnosis of SARS-CoV-2 by FDA under an Emergency Use Authorization (EUA). This EUA will remain  in effect (meaning this test can be used) for the duration of the COVID-19 declaration under Section 56 4(b)(1) of the Act, 21 U.S.C. section 360bbb-3(b)(1), unless the authorization is terminated or revoked sooner. Performed at [facility], [place] [address], [place], [place] [address]          Radiology Studies: CT Chest Wo Contrast  Result Date: 12/14/2019 CLINICAL DATA:  Cough, shortness of breath EXAM: CT CHEST WITHOUT CONTRAST TECHNIQUE: Multidetector CT imaging of the chest was performed following the standard protocol without IV contrast. COMPARISON:  X-ray 12/14/2019, abdominal MRI 11/11/2019 FINDINGS: Technical note: Examination degraded by respiratory motion artifact. Cardiovascular: Mild cardiomegaly. No pericardial effusion. Thoracic aorta nonaneurysmal. Minimal aortic atherosclerotic calcification. Prominent coronary artery calcifications. Mediastinum/Nodes: No axillary lymphadenopathy. Scattered mediastinal lymph nodes. No mediastinal lymphadenopathy by size  criteria. Evaluation of the hilar structures is limited in the absence of intravenous contrast. Unremarkable thyroid, trachea, and esophagus. Lungs/Pleura: Trace left pleural effusion. No focal airspace consolidation. No pneumothorax. No evidence of edema. Upper Abdomen: Nodularity of the hepatic contour compatible with history of cirrhosis. Visualized portion of the spleen appears enlarged. Small amount of ascites within the upper abdomen. Musculoskeletal: Degenerative changes within the thoracic spine. Exaggerated thoracic kyphosis. No acute osseous finding. Mild bilateral gynecomastia IMPRESSION: 1. Trace left pleural effusion. No focal airspace consolidation. 2. Nodularity of the hepatic contour compatible with history of cirrhosis. Splenomegaly. Small amount of ascites within the upper abdomen. 3. Coronary atherosclerosis. Electronically Signed   By: Davina Poke D.O.   On: 12/14/2019 11:28   NM Pulmonary Perfusion  Result Date: 12/14/2019 CLINICAL DATA:  Shortness of breath EXAM: NUCLEAR  MEDICINE PERFUSION LUNG SCAN TECHNIQUE: Perfusion images were obtained in multiple projections after intravenous injection of radiopharmaceutical. Ventilation scans intentionally deferred if perfusion scan and chest x-ray adequate for interpretation during COVID 19 epidemic. RADIOPHARMACEUTICALS:  4.47 mCi Tc-34m MAA IV COMPARISON:  Chest x-ray today FINDINGS: No segmental or subsegmental perfusion defects to suggest pulmonary embolus. IMPRESSION: No evidence of pulmonary embolus. Electronically Signed   By: Rolm Baptise M.D.   On: 12/14/2019 15:07   DG Chest Portable 1 View  Result Date: 12/14/2019 CLINICAL DATA:  Cough and shortness of breath EXAM: PORTABLE CHEST 1 VIEW COMPARISON:  April 28, 2019 FINDINGS: There is no edema or airspace opacity. There is cardiomegaly with central pulmonary vascular congestion and mild pulmonary venous hypertension. No adenopathy. No bone lesions. IMPRESSION: Stable cardiomegaly  with a degree of pulmonary vascular congestion. No frank edema or consolidation. No adenopathy. Electronically Signed   By: Lowella Grip III M.D.   On: 12/14/2019 09:32        Scheduled Meds: . atorvastatin  20 mg Oral q1800  . dextromethorphan-guaiFENesin  1 tablet Oral BID  . ferrous sulfate  325 mg Oral BID WC  . furosemide  80 mg Oral BID  . heparin  5,000 Units Subcutaneous Q8H  . insulin aspart  0-9 Units Subcutaneous Q4H  . insulin aspart protamine- aspart  60 Units Subcutaneous BID AC  . lactulose  30 g Oral QID  . loratadine  10 mg Oral Daily  . multivitamin with minerals  1 tablet Oral Daily  . pantoprazole  40 mg Oral BID   Continuous Infusions: . sodium chloride Stopped (12/14/19 2229)  . cefTRIAXone (ROCEPHIN)  IV    . vancomycin       LOS: 1 day    Time spent: 35 mins    Wyvonnia Dusky, MD [facility] Pager [phone]  If 7PM-7AM, please contact night-coverage [email or web address] 12/15/2019, 8:28 AM

## 2019-12-16 ENCOUNTER — Inpatient Hospital Stay
Admit: 2019-12-16 | Discharge: 2019-12-16 | Disposition: A | Discharge status: Home / Self Care | Payer: Medicare Other | Attending: Internal Medicine | Admitting: Internal Medicine

## 2019-12-16 DIAGNOSIS — R7989 Other specified abnormal findings of blood chemistry: Secondary | ICD-10-CM

## 2019-12-16 DIAGNOSIS — R778 Other specified abnormalities of plasma proteins: Secondary | ICD-10-CM

## 2019-12-16 LAB — CBC
HCT: 24 % — ABNORMAL LOW (ref 39.0–52.0)
Hemoglobin: 7.5 g/dL — ABNORMAL LOW (ref 13.0–17.0)
MCH: 30.2 pg (ref 26.0–34.0)
MCHC: 31.3 g/dL (ref 30.0–36.0)
MCV: 96.8 fL (ref 80.0–100.0)
Platelets: 120 10*3/uL — ABNORMAL LOW (ref 150–400)
RBC: 2.48 MIL/uL — ABNORMAL LOW (ref 4.22–5.81)
RDW: 15.9 % — ABNORMAL HIGH (ref 11.5–15.5)
WBC: 8 10*3/uL (ref 4.0–10.5)
nRBC: 0 % (ref 0.0–0.2)

## 2019-12-16 LAB — GLUCOSE, CAPILLARY
Glucose-Capillary: 105 mg/dL — ABNORMAL HIGH (ref 70–99)
Glucose-Capillary: 122 mg/dL — ABNORMAL HIGH (ref 70–99)
Glucose-Capillary: 140 mg/dL — ABNORMAL HIGH (ref 70–99)
Glucose-Capillary: 142 mg/dL — ABNORMAL HIGH (ref 70–99)
Glucose-Capillary: 94 mg/dL (ref 70–99)
Glucose-Capillary: 96 mg/dL (ref 70–99)

## 2019-12-16 LAB — TROPONIN I (HIGH SENSITIVITY): Troponin I (High Sensitivity): 147 ng/L (ref <18)

## 2019-12-16 LAB — COMPREHENSIVE METABOLIC PANEL
ALT: 19 U/L (ref 0–44)
AST: 31 U/L (ref 15–41)
Albumin: 2.3 g/dL — ABNORMAL LOW (ref 3.5–5.0)
Alkaline Phosphatase: 62 U/L (ref 38–126)
Anion gap: 6 (ref 5–15)
BUN: 48 mg/dL — ABNORMAL HIGH (ref 8–23)
CO2: 25 mmol/L (ref 22–32)
Calcium: 8.4 mg/dL — ABNORMAL LOW (ref 8.9–10.3)
Chloride: 112 mmol/L — ABNORMAL HIGH (ref 98–111)
Creatinine, Ser: 2.52 mg/dL — ABNORMAL HIGH (ref 0.61–1.24)
GFR calc Af Amer: 29 mL/min — ABNORMAL LOW (ref >60)
GFR calc non Af Amer: 25 mL/min — ABNORMAL LOW (ref >60)
Glucose, Bld: 101 mg/dL — ABNORMAL HIGH (ref 70–99)
Potassium: 3.5 mmol/L (ref 3.5–5.1)
Sodium: 143 mmol/L (ref 135–145)
Total Bilirubin: 0.7 mg/dL (ref 0.3–1.2)
Total Protein: 6.1 g/dL — ABNORMAL LOW (ref 6.5–8.1)

## 2019-12-16 MED ORDER — PERFLUTREN LIPID MICROSPHERE
1.0000 mL | INTRAVENOUS | Status: AC | PRN
  Administered 2019-12-16: 2 mL via INTRAVENOUS
  Filled 2019-12-16: qty 10

## 2019-12-16 MED ORDER — SALINE SPRAY 0.65 % NA SOLN
1.0000 | NASAL | Status: DC | PRN
  Administered 2019-12-16 – 2019-12-17 (×2): 1 via NASAL
  Filled 2019-12-16 (×2): qty 44

## 2019-12-16 NOTE — Consult Note (Signed)
Infectious Disease     Reason for Consult: Cellultis, wounds    Referring Physician: Dr Jimmye Norman Date of Admission:  12/14/2019   Principal Problem:   Cellulitis of lower extremity Active Problems:   Gastroesophageal reflux disease   Iron deficiency anemia   Sepsis ([place])   Acute renal failure superimposed on stage 3a chronic kidney disease (HCC)   HTN (hypertension)   Chronic diastolic CHF (congestive heart failure) (HCC)   Type II diabetes mellitus with renal manifestations (HCC)   Liver cirrhosis (HCC)   Anxiety   SOB (shortness of breath)   Elevated troponin   HPI: Corey West is a 69 y.o. male 69 y.o. male with medical history significant of hypertension, hyperlipidemia, diabetes mellitus, chronic lower extremity venous insufficiency change, GERD, anxiety, iron deficiency anemia, liver cirrhosis, AVM, dCHF, GI bleeding, CKD-3, admitted 3/15 with worsening LE swelling pain and SOB.  He has chronic bilateral lower extremity venous insufficiency, and chronic leg swelling and chronic left leg wound and follows  with wound care center.  On admit wbc 12, no fevers. Started on vanco and ceftriaxone. Bcx + Klebsiella.  Clinically stable, wbc down, no fevers.  CT chest neg for PE, does show cirrhosis. USS doppler neg last admit. MRI in Feb of liver showed mild cirrhosis, no focal lesion.   Past Medical History:  Diagnosis Date  . Anemia   . Anxiety    controlled;   . Arthritis   . AVM (arteriovenous malformation) of stomach, acquired with hemorrhage   . Barrett's esophagus   . Chronic kidney disease    renal infufficiency  . Cirrhosis ([place])   . Depression    controlled;   Marland Kitchen Diabetes mellitus without complication ([place])    not controlled, taking insulin but sugar continues to run high;   . Edema   . Esophageal varices ([place])   . GAVE (gastric antral vascular ectasia)   . GERD (gastroesophageal reflux disease)   . History of hiatal hernia   . Hyperlipidemia   . Hypertension     controlled well;   . Liver cirrhosis ([place])   . Nephropathy, diabetic ([place])   . Obesity   . Pancytopenia ([place])   . Polyp, stomach    with chronic blood loss  . Sleep apnea    does not wear a cpap, Medicare would not pay for it   . Venous stasis dermatitis of both lower extremities   . Venous stasis of both lower extremities    with cellulitis   Past Surgical History:  Procedure Laterality Date  . ESOPHAGOGASTRODUODENOSCOPY N/A 02/09/2015   Procedure: ESOPHAGOGASTRODUODENOSCOPY (EGD);  Surgeon: Manya Silvas, MD;  Location: [facility] ENDOSCOPY;  Service: Endoscopy;  Laterality: N/A;  . ESOPHAGOGASTRODUODENOSCOPY N/A 07/22/2015   Procedure: ESOPHAGOGASTRODUODENOSCOPY (EGD);  Surgeon: Manya Silvas, MD;  Location: [facility] ENDOSCOPY;  Service: Endoscopy;  Laterality: N/A;  . ESOPHAGOGASTRODUODENOSCOPY N/A 06/26/2017   Procedure: ESOPHAGOGASTRODUODENOSCOPY (EGD);  Surgeon: Manya Silvas, MD;  Location[facility] ENDOSCOPY;  Service: Endoscopy;  Laterality: N/A;  . ESOPHAGOGASTRODUODENOSCOPY N/A 06/27/2017   Procedure: ESOPHAGOGASTRODUODENOSCOPY (EGD);  Surgeon: Manya Silvas, MD;  Location: [facility] ENDOSCOPY;  Service: Endoscopy;  Laterality: N/A;  . ESOPHAGOGASTRODUODENOSCOPY N/A 06/28/2017   Procedure: ESOPHAGOGASTRODUODENOSCOPY (EGD);  Surgeon: Manya Silvas, MD;  Location: [facility] ENDOSCOPY;  Service: Endoscopy;  Laterality: N/A;  . ESOPHAGOGASTRODUODENOSCOPY Bilateral 10/11/2017   Procedure: ESOPHAGOGASTRODUODENOSCOPY (EGD);  Surgeon: Manya Silvas, MD;  Location: [facility] ENDOSCOPY;  Service: Endoscopy;  Laterality: Bilateral;  . ESOPHAGOGASTRODUODENOSCOPY N/A 12/04/2017  Procedure: ESOPHAGOGASTRODUODENOSCOPY (EGD);  Surgeon: Manya Silvas, MD;  Location: [facility] ENDOSCOPY;  Service: Endoscopy;  Laterality: N/A;  . ESOPHAGOGASTRODUODENOSCOPY N/A 03/06/2019   Procedure: ESOPHAGOGASTRODUODENOSCOPY (EGD);  Surgeon: Toledo, Benay Pike, MD;  Location: [facility] ENDOSCOPY;  Service: Gastroenterology;   Laterality: N/A;  . ESOPHAGOGASTRODUODENOSCOPY (EGD) WITH PROPOFOL N/A 07/20/2015   Procedure: ESOPHAGOGASTRODUODENOSCOPY (EGD) WITH PROPOFOL;  Surgeon: Manya Silvas, MD;  Location: [facility] ENDOSCOPY;  Service: Endoscopy;  Laterality: N/A;  . ESOPHAGOGASTRODUODENOSCOPY (EGD) WITH PROPOFOL N/A 09/16/2015   Procedure: ESOPHAGOGASTRODUODENOSCOPY (EGD) WITH PROPOFOL;  Surgeon: Manya Silvas, MD;  Location: [facility] ENDOSCOPY;  Service: Endoscopy;  Laterality: N/A;  . ESOPHAGOGASTRODUODENOSCOPY (EGD) WITH PROPOFOL N/A 03/16/2016   Procedure: ESOPHAGOGASTRODUODENOSCOPY (EGD) WITH PROPOFOL;  Surgeon: Manya Silvas, MD;  Location: [facility] ENDOSCOPY;  Service: Endoscopy;  Laterality: N/A;  . ESOPHAGOGASTRODUODENOSCOPY (EGD) WITH PROPOFOL N/A 09/14/2016   Procedure: ESOPHAGOGASTRODUODENOSCOPY (EGD) WITH PROPOFOL;  Surgeon: Manya Silvas, MD;  Location: [facility] ENDOSCOPY;  Service: Endoscopy;  Laterality: N/A;  . ESOPHAGOGASTRODUODENOSCOPY (EGD) WITH PROPOFOL N/A 11/05/2016   Procedure: ESOPHAGOGASTRODUODENOSCOPY (EGD) WITH PROPOFOL;  Surgeon: Manya Silvas, MD;  Location: [facility] ENDOSCOPY;  Service: Endoscopy;  Laterality: N/A;  . ESOPHAGOGASTRODUODENOSCOPY (EGD) WITH PROPOFOL N/A 02/06/2017   Procedure: ESOPHAGOGASTRODUODENOSCOPY (EGD) WITH PROPOFOL;  Surgeon: Manya Silvas, MD;  Location: [facility] ENDOSCOPY;  Service: Endoscopy;  Laterality: N/A;  . ESOPHAGOGASTRODUODENOSCOPY (EGD) WITH PROPOFOL N/A 04/24/2017   Procedure: ESOPHAGOGASTRODUODENOSCOPY (EGD) WITH PROPOFOL;  Surgeon: Manya Silvas, MD;  Location: [facility] ENDOSCOPY;  Service: Endoscopy;  Laterality: N/A;  . ESOPHAGOGASTRODUODENOSCOPY (EGD) WITH PROPOFOL N/A 07/31/2017   Procedure: ESOPHAGOGASTRODUODENOSCOPY (EGD) WITH PROPOFOL;  Surgeon: Manya Silvas, MD;  Location: [facility] ENDOSCOPY;  Service: Endoscopy;  Laterality: N/A;  . ESOPHAGOGASTRODUODENOSCOPY (EGD) WITH PROPOFOL N/A 08/08/2018   Procedure: ESOPHAGOGASTRODUODENOSCOPY (EGD) WITH PROPOFOL;   Surgeon: Manya Silvas, MD;  Location: [facility] ENDOSCOPY;  Service: Endoscopy;  Laterality: N/A;  . GIVENS CAPSULE STUDY N/A 12/05/2017   Procedure: GIVENS CAPSULE STUDY;  Surgeon: Manya Silvas, MD;  Location: [facility] ENDOSCOPY;  Service: Endoscopy;  Laterality: N/A;  . TONSILLECTOMY    . TONSILLECTOMY AND ADENOIDECTOMY    . ULNAR NERVE TRANSPOSITION    . UVULOPALATOPHARYNGOPLASTY     Social History   Tobacco Use  . Smoking status: Former Smoker    Years: 20.00    Types: Cigars, Cigarettes  . Smokeless tobacco: Never Used  Substance Use Topics  . Alcohol use: No    Comment: stopped 15 years ago  . Drug use: No   Family History  Problem Relation Age of Onset  . Diabetes Other   . Transient ischemic attack Father   . CAD Father     Allergies: No Known Allergies  Current antibiotics: Antibiotics Given (last 72 hours)    Date/Time Action Medication Dose Rate   12/14/19 1211 New Bag/Given   vancomycin (VANCOCIN) IVPB 1000 mg/200 mL premix 1,000 mg 200 mL/hr   12/14/19 1213 New Bag/Given   cefTRIAXone (ROCEPHIN) 2 g in sodium chloride 0.9 % 100 mL IVPB 2 g 200 mL/hr   12/14/19 2016 New Bag/Given   vancomycin (VANCOREADY) IVPB 1500 mg/300 mL 1,500 mg 150 mL/hr   12/15/19 1219 New Bag/Given   cefTRIAXone (ROCEPHIN) 2 g in sodium chloride 0.9 % 100 mL IVPB 2 g 200 mL/hr   12/15/19 2107 New Bag/Given   vancomycin (VANCOCIN) IVPB 1000 mg/200 mL premix 1,000 mg 200 mL/hr   12/16/19 1226 New Bag/Given   cefTRIAXone (ROCEPHIN) 2 g in sodium  chloride 0.9 % 100 mL IVPB  200 mL/hr      MEDICATIONS: . atorvastatin  20 mg Oral q1800  . dextromethorphan-guaiFENesin  1 tablet Oral BID  . ferrous sulfate  325 mg Oral BID WC  . furosemide  80 mg Oral BID  . heparin  5,000 Units Subcutaneous Q8H  . insulin aspart  0-9 Units Subcutaneous Q4H  . insulin aspart protamine- aspart  60 Units Subcutaneous BID AC  . lactulose  30 g Oral QID  . loratadine  10 mg Oral Daily  . multivitamin  with minerals  1 tablet Oral Daily  . pantoprazole  40 mg Oral BID    Review of Systems - 11 systems reviewed and negative per HPI   OBJECTIVE: Temp:  [97.5 F (36.4 C)-98.9 F (37.2 C)] 98.3 F (36.8 C) (03/17 1156) Pulse Rate:  [64-73] 73 (03/17 1156) Resp:  [15-19] 15 (03/17 0800) BP: (141-167)/(56-67) 167/67 (03/17 1156) SpO2:  [97 %-100 %] 99 % (03/17 1156) Physical Exam  Constitutional: He is oriented to person, place, and time. He appears well-developed and well-nourished. No distress. obese HENT: anicteric Mouth/Throat: Oropharynx is clear and moist. No oropharyngeal exudate.  Cardiovascular: Normal rate, regular rhythm and normal heart sounds. Exam reveals no gallop and no friction rub.  No murmur heard.  Pulmonary/Chest: Effort normal and breath sounds normal. No respiratory distress. He has no wheezes.  Abdominal: obese Soft. Bowel sounds are normal. He exhibits no distension. There is no tenderness.  Lymphadenopathy: He has no cervical adenopathy.  Neurological: He is alert and oriented to person, place, and time.  Skin: RLE with unnawrap. LLE with mild erythema, scaly skin but no open wounds Psychiatric: He has a normal mood and affect. His behavior is normal.  Ext 3+ edema - to upper thighs   LABS: Results for orders placed or performed during the hospital encounter of 12/14/19 (from the past 48 hour(s))  Troponin I (High Sensitivity)     Status: Abnormal   Collection Time: 12/14/19  4:22 PM  Result Value Ref Range   Troponin I (High Sensitivity) 539 (HH) <18 ng/L    Comment: CRITICAL VALUE NOTED. VALUE IS CONSISTENT WITH PREVIOUSLY REPORTED/CALLED VALUE / JAG (NOTE) Elevated high sensitivity troponin I (hsTnI) values and significant  changes across serial measurements may suggest ACS but many other  chronic and acute conditions are known to elevate hsTnI results.  Refer to the "Links" section for chest pain algorithms and additional  guidance. Performed at  [facility], [address] [place], [place] [address]   Glucose, capillary     Status: Abnormal   Collection Time: 12/14/19  6:27 PM  Result Value Ref Range   Glucose-Capillary 128 (H) 70 - 99 mg/dL    Comment: Glucose reference range applies only to samples taken after fasting for at least 8 hours.  Sedimentation rate     Status: Abnormal   Collection Time: 12/14/19  7:22 PM  Result Value Ref Range   Sed Rate 93 (H) 0 - 20 mm/hr    Comment: Performed at [facility], [address] [place], [place] [address]  C-reactive protein     Status: Abnormal   Collection Time: 12/14/19  7:22 PM  Result Value Ref Range   CRP 4.6 (H) <1.0 mg/dL    Comment: Performed at [facility], [place] [address] [place], [place] [address]  Lactic acid, plasma     Status: None   Collection Time: 12/14/19  7:22  PM  Result Value Ref Range   Lactic Acid, Venous 1.8 0.5 - 1.9 mmol/L    Comment: Performed at [facility], [address] [place], [place] [address]  APTT     Status: None   Collection Time: 12/14/19  7:22 PM  Result Value Ref Range   aPTT 34 24 - 36 seconds    Comment: Performed at [facility], [address] [place], [place] [address]  Protime-INR     Status: Abnormal   Collection Time: 12/14/19  7:22 PM  Result Value Ref Range   Prothrombin Time 16.9 (H) 11.4 - 15.2 seconds   INR 1.4 (H) 0.8 - 1.2    Comment: (NOTE) INR goal varies based on device and disease states. Performed at [facility], [place], [place] [address]   Troponin I (High Sensitivity)     Status: Abnormal   Collection Time: 12/14/19  7:22 PM  Result Value Ref Range   Troponin I (High Sensitivity) 622 (HH) <18 ng/L    Comment: CRITICAL VALUE NOTED. VALUE IS CONSISTENT WITH PREVIOUSLY REPORTED/CALLED VALUE MJU (NOTE) Elevated high sensitivity troponin I (hsTnI) values and significant  changes across serial measurements may suggest ACS but many  other  chronic and acute conditions are known to elevate hsTnI results.  Refer to the "Links" section for chest pain algorithms and additional  guidance. Performed at [facility], [address] [place], [place] [address]   Glucose, capillary     Status: Abnormal   Collection Time: 12/14/19  8:33 PM  Result Value Ref Range   Glucose-Capillary 137 (H) 70 - 99 mg/dL    Comment: Glucose reference range applies only to samples taken after fasting for at least 8 hours.   Comment 1 Notify RN   Lactic acid, plasma     Status: None   Collection Time: 12/14/19 10:18 PM  Result Value Ref Range   Lactic Acid, Venous 1.6 0.5 - 1.9 mmol/L    Comment: Performed at [facility], [place], [place] [address]  Troponin I (High Sensitivity)     Status: Abnormal   Collection Time: 12/14/19 10:18 PM  Result Value Ref Range   Troponin I (High Sensitivity) 582 (HH) <18 ng/L    Comment: CRITICAL VALUE NOTED. VALUE IS CONSISTENT WITH PREVIOUSLY REPORTED/CALLED VALUE RWW (NOTE) Elevated high sensitivity troponin I (hsTnI) values and significant  changes across serial measurements may suggest ACS but many other  chronic and acute conditions are known to elevate hsTnI results.  Refer to the "Links" section for chest pain algorithms and additional  guidance. Performed at [facility], [address] [place], [place] [address]   Glucose, capillary     Status: Abnormal   Collection Time: 12/15/19  1:46 AM  Result Value Ref Range   Glucose-Capillary 147 (H) 70 - 99 mg/dL    Comment: Glucose reference range applies only to samples taken after fasting for at least 8 hours.   Comment 1 Notify RN   Glucose, capillary     Status: Abnormal   Collection Time: 12/15/19  4:01 AM  Result Value Ref Range   Glucose-Capillary 165 (H) 70 - 99 mg/dL    Comment: Glucose reference range applies only to samples taken after fasting for at least 8 hours.   Comment 1 Notify RN    Basic metabolic panel     Status: Abnormal   Collection Time: 12/15/19  5:21 AM  Result Value  Ref Range   Sodium 142 135 - 145 mmol/L    Comment:  ELECTROLYTES REPEATED SDR   Potassium 4.1 3.5 - 5.1 mmol/L   Chloride 111 98 - 111 mmol/L   CO2 22 22 - 32 mmol/L   Glucose, Bld 183 (H) 70 - 99 mg/dL    Comment: Glucose reference range applies only to samples taken after fasting for at least 8 hours.   BUN 45 (H) 8 - 23 mg/dL   Creatinine, Ser 2.68 (H) 0.61 - 1.24 mg/dL   Calcium 8.3 (L) 8.9 - 10.3 mg/dL   GFR calc non Af Amer 24 (L) >60 mL/min   GFR calc Af Amer 27 (L) >60 mL/min   Anion gap 9 5 - 15    Comment: Performed at [facility], [address] [place], [place] [address]  CBC     Status: Abnormal   Collection Time: 12/15/19  5:21 AM  Result Value Ref Range   WBC 11.2 (H) 4.0 - 10.5 K/uL   RBC 2.46 (L) 4.22 - 5.81 MIL/uL   Hemoglobin 7.5 (L) 13.0 - 17.0 g/dL   HCT 24.0 (L) 39.0 - 52.0 %   MCV 97.6 80.0 - 100.0 fL   MCH 30.5 26.0 - 34.0 pg   MCHC 31.3 30.0 - 36.0 g/dL   RDW 16.2 (H) 11.5 - 15.5 %   Platelets 109 (L) 150 - 400 K/uL    Comment: Immature Platelet Fraction may be clinically indicated, consider ordering this additional test [id number]    nRBC 0.0 0.0 - 0.2 %    Comment: Performed at [facility], [address] [place], [place] [address]  Hemoglobin A1c     Status: Abnormal   Collection Time: 12/15/19  5:21 AM  Result Value Ref Range   Hgb A1c MFr Bld 6.6 (H) 4.8 - 5.6 %    Comment: (NOTE) Pre diabetes:          5.7%-6.4% Diabetes:              >6.4% Glycemic control for   <7.0% adults with diabetes    Mean Plasma Glucose 142.72 mg/dL    Comment: Performed at [facility], [place] [address] [place], [place] [address]  Lipid panel     Status: Abnormal   Collection Time: 12/15/19  5:21 AM  Result Value Ref Range   Cholesterol 141 0 - 200 mg/dL   Triglycerides 56 <150 mg/dL   HDL 37 (L) >40 mg/dL   Total CHOL/HDL Ratio 3.8  RATIO   VLDL 11 0 - 40 mg/dL   LDL Cholesterol 93 0 - 99 mg/dL    Comment:        Total Cholesterol/HDL:CHD Risk Coronary Heart Disease Risk Table                     Men   Women  1/2 Average Risk   3.4   3.3  Average Risk       5.0   4.4  2 X Average Risk   9.6   7.1  3 X Average Risk  23.4   11.0        Use the calculated Patient Ratio above and the CHD Risk Table to determine the patient's CHD Risk.        ATP III CLASSIFICATION (LDL):  <100     mg/dL   Optimal  100-129  mg/dL   Near or Above  Optimal  130-159  mg/dL   Borderline  160-189  mg/dL   High  >190     mg/dL   Very High Performed at [facility], [address] [place], [place] [address]   Glucose, capillary     Status: Abnormal   Collection Time: 12/15/19  8:29 AM  Result Value Ref Range   Glucose-Capillary 149 (H) 70 - 99 mg/dL    Comment: Glucose reference range applies only to samples taken after fasting for at least 8 hours.  Glucose, capillary     Status: Abnormal   Collection Time: 12/15/19 11:57 AM  Result Value Ref Range   Glucose-Capillary 187 (H) 70 - 99 mg/dL    Comment: Glucose reference range applies only to samples taken after fasting for at least 8 hours.  Glucose, capillary     Status: Abnormal   Collection Time: 12/15/19  4:23 PM  Result Value Ref Range   Glucose-Capillary 198 (H) 70 - 99 mg/dL    Comment: Glucose reference range applies only to samples taken after fasting for at least 8 hours.  Glucose, capillary     Status: Abnormal   Collection Time: 12/15/19  8:35 PM  Result Value Ref Range   Glucose-Capillary 133 (H) 70 - 99 mg/dL    Comment: Glucose reference range applies only to samples taken after fasting for at least 8 hours.   Comment 1 Notify RN   Glucose, capillary     Status: Abnormal   Collection Time: 12/16/19 12:08 AM  Result Value Ref Range   Glucose-Capillary 122 (H) 70 - 99 mg/dL    Comment: Glucose reference range applies only to samples  taken after fasting for at least 8 hours.   Comment 1 Notify RN   Glucose, capillary     Status: None   Collection Time: 12/16/19  4:21 AM  Result Value Ref Range   Glucose-Capillary 96 70 - 99 mg/dL    Comment: Glucose reference range applies only to samples taken after fasting for at least 8 hours.   Comment 1 Notify RN   CBC     Status: Abnormal   Collection Time: 12/16/19  5:36 AM  Result Value Ref Range   WBC 8.0 4.0 - 10.5 K/uL   RBC 2.48 (L) 4.22 - 5.81 MIL/uL   Hemoglobin 7.5 (L) 13.0 - 17.0 g/dL   HCT 24.0 (L) 39.0 - 52.0 %   MCV 96.8 80.0 - 100.0 fL   MCH 30.2 26.0 - 34.0 pg   MCHC 31.3 30.0 - 36.0 g/dL   RDW 15.9 (H) 11.5 - 15.5 %   Platelets 120 (L) 150 - 400 K/uL    Comment: Immature Platelet Fraction may be clinically indicated, consider ordering this additional test [id number]    nRBC 0.0 0.0 - 0.2 %    Comment: Performed at [facility] [place], [address] [place], [place] [address]  Comprehensive metabolic panel     Status: Abnormal   Collection Time: 12/16/19  5:36 AM  Result Value Ref Range   Sodium 143 135 - 145 mmol/L   Potassium 3.5 3.5 - 5.1 mmol/L   Chloride 112 (H) 98 - 111 mmol/L   CO2 25 22 - 32 mmol/L   Glucose, Bld 101 (H) 70 - 99 mg/dL    Comment: Glucose reference range applies only to samples taken after fasting for at least 8 hours.   BUN 48 (H) 8 - 23 mg/dL   Creatinine, Ser 2.52 (H) 0.61 -  1.24 mg/dL   Calcium 8.4 (L) 8.9 - 10.3 mg/dL   Total Protein 6.1 (L) 6.5 - 8.1 g/dL   Albumin 2.3 (L) 3.5 - 5.0 g/dL   AST 31 15 - 41 U/L   ALT 19 0 - 44 U/L   Alkaline Phosphatase 62 38 - 126 U/L   Total Bilirubin 0.7 0.3 - 1.2 mg/dL   GFR calc non Af Amer 25 (L) >60 mL/min   GFR calc Af Amer 29 (L) >60 mL/min   Anion gap 6 5 - 15    Comment: Performed at [facility], [address] [place], [place] [address]  Glucose, capillary     Status: None   Collection Time: 12/16/19  7:59 AM  Result Value Ref Range   Glucose-Capillary  94 70 - 99 mg/dL    Comment: Glucose reference range applies only to samples taken after fasting for at least 8 hours.  Glucose, capillary     Status: Abnormal   Collection Time: 12/16/19 11:52 AM  Result Value Ref Range   Glucose-Capillary 105 (H) 70 - 99 mg/dL    Comment: Glucose reference range applies only to samples taken after fasting for at least 8 hours.   No components found for: ESR, C REACTIVE PROTEIN MICRO: Recent Results (from the past 720 hour(s))  SARS CORONAVIRUS 2 (TAT 6-24 HRS) Nasopharyngeal Nasopharyngeal Swab     Status: None   Collection Time: 11/27/19  3:34 PM   Specimen: Nasopharyngeal Swab  Result Value Ref Range Status   SARS Coronavirus 2 NEGATIVE NEGATIVE Final    Comment: (NOTE) SARS-CoV-2 target nucleic acids are NOT DETECTED. The SARS-CoV-2 RNA is generally detectable in upper and lower respiratory specimens during the acute phase of infection. Negative results do not preclude SARS-CoV-2 infection, do not rule out co-infections with other pathogens, and should not be used as the sole basis for treatment or other patient management decisions. Negative results must be combined with clinical observations, patient history, and epidemiological information. The expected result is Negative. Fact Sheet for Patients: SugarRoll.be Fact Sheet for Healthcare Providers: https://www.woods-mathews.com/ This test is not yet approved or cleared by the [place] FDA and  has been authorized for detection and/or diagnosis of SARS-CoV-2 by FDA under an Emergency Use Authorization (EUA). This EUA will remain  in effect (meaning this test can be used) for the duration of the COVID-19 declaration under Section 56 4(b)(1) of the Act, 21 U.S.C. section 360bbb-3(b)(1), unless the authorization is terminated or revoked sooner. Performed at [facility], [place] [address], [place], [place] [address]   Blood culture (routine x  2)     Status: None   Collection Time: 11/27/19  3:34 PM   Specimen: BLOOD  Result Value Ref Range Status   Specimen Description BLOOD R FA  Final   Special Requests   Final    BOTTLES DRAWN AEROBIC AND ANAEROBIC Blood Culture adequate volume   Culture   Final    NO GROWTH 5 DAYS Performed at [facility], [address] [place], [place] [address]    Report Status 12/02/2019 FINAL  Final  Blood culture (routine x 2)     Status: None   Collection Time: 11/27/19  3:34 PM   Specimen: BLOOD  Result Value Ref Range Status   Specimen Description BLOOD BLOOD LEFT FOREARM  Final   Special Requests   Final    BOTTLES DRAWN AEROBIC AND ANAEROBIC Blood Culture adequate volume   Culture  Final    NO GROWTH 5 DAYS Performed at [facility], [address] [place], [place] [address]    Report Status 12/02/2019 FINAL  Final  Blood culture (single)     Status: Abnormal (Preliminary result)   Collection Time: 12/14/19  9:05 AM   Specimen: BLOOD  Result Value Ref Range Status   Specimen Description   Final    BLOOD RIGHT FA Performed at [facility], [address] [place], [place] [address]    Special Requests   Final    BOTTLES DRAWN AEROBIC AND ANAEROBIC Blood Culture adequate volume Performed at [facility], [address] [place], [place] [address]    Culture  Setup Time   Final    AEROBIC BOTTLE ONLY GRAM NEGATIVE RODS CRITICAL RESULT CALLED TO, READ BACK BY AND VERIFIED WITH: Hart Robinsons PHARMD [id number] 12/14/19 HNM    Culture (A)  Final    KLEBSIELLA OXYTOCA SUSCEPTIBILITIES TO FOLLOW Performed at [facility], [place] [address] [place], [place] [address]    Report Status PENDING  Incomplete  Blood Culture ID Panel (Reflexed)     Status: Abnormal   Collection Time: 12/14/19  9:05 AM  Result Value Ref Range Status   Enterococcus species NOT DETECTED NOT DETECTED Final   Listeria monocytogenes NOT DETECTED NOT DETECTED Final    Staphylococcus species NOT DETECTED NOT DETECTED Final   Staphylococcus aureus (BCID) NOT DETECTED NOT DETECTED Final   Streptococcus species NOT DETECTED NOT DETECTED Final   Streptococcus agalactiae NOT DETECTED NOT DETECTED Final   Streptococcus pneumoniae NOT DETECTED NOT DETECTED Final   Streptococcus pyogenes NOT DETECTED NOT DETECTED Final   Acinetobacter baumannii NOT DETECTED NOT DETECTED Final   Enterobacteriaceae species DETECTED (A) NOT DETECTED Final    Comment: Enterobacteriaceae represent a large family of gram-negative bacteria, not a single organism. CRITICAL RESULT CALLED TO, READ BACK BY AND VERIFIED WITH: SCOTT HALL PHARMD 2344 12/14/19 HNM    Enterobacter cloacae complex NOT DETECTED NOT DETECTED Final   Escherichia coli NOT DETECTED NOT DETECTED Final   Klebsiella oxytoca DETECTED (A) NOT DETECTED Final    Comment: CRITICAL RESULT CALLED TO, READ BACK BY AND VERIFIED WITH: SCOTT HALL PHARMD 2344 12/14/19 HNM    Klebsiella pneumoniae NOT DETECTED NOT DETECTED Final   Proteus species NOT DETECTED NOT DETECTED Final   Serratia marcescens NOT DETECTED NOT DETECTED Final   Carbapenem resistance NOT DETECTED NOT DETECTED Final   Haemophilus influenzae NOT DETECTED NOT DETECTED Final   Neisseria meningitidis NOT DETECTED NOT DETECTED Final   Pseudomonas aeruginosa NOT DETECTED NOT DETECTED Final   Candida albicans NOT DETECTED NOT DETECTED Final   Candida glabrata NOT DETECTED NOT DETECTED Final   Candida krusei NOT DETECTED NOT DETECTED Final   Candida parapsilosis NOT DETECTED NOT DETECTED Final   Candida tropicalis NOT DETECTED NOT DETECTED Final    Comment: Performed at [facility], [place], [place] [address]  SARS CORONAVIRUS 2 (TAT 6-24 HRS) Nasopharyngeal Nasopharyngeal Swab     Status: None   Collection Time: 12/14/19  9:29 AM   Specimen: Nasopharyngeal Swab  Result Value Ref Range Status   SARS Coronavirus 2 NEGATIVE NEGATIVE Final     Comment: (NOTE) SARS-CoV-2 target nucleic acids are NOT DETECTED. The SARS-CoV-2 RNA is generally detectable in upper and lower respiratory specimens during the acute phase of infection. Negative results do not preclude SARS-CoV-2 infection, do not rule out co-infections with other pathogens,  and should not be used as the sole basis for treatment or other patient management decisions. Negative results must be combined with clinical observations, patient history, and epidemiological information. The expected result is Negative. Fact Sheet for Patients: SugarRoll.be Fact Sheet for Healthcare Providers: https://www.woods-mathews.com/ This test is not yet approved or cleared by the [place] FDA and  has been authorized for detection and/or diagnosis of SARS-CoV-2 by FDA under an Emergency Use Authorization (EUA). This EUA will remain  in effect (meaning this test can be used) for the duration of the COVID-19 declaration under Section 56 4(b)(1) of the Act, 21 U.S.C. section 360bbb-3(b)(1), unless the authorization is terminated or revoked sooner. Performed at [facility], [place] [address], [place], [place] [address]     IMAGING: CT Chest Wo Contrast  Result Date: 12/14/2019 CLINICAL DATA:  Cough, shortness of breath EXAM: CT CHEST WITHOUT CONTRAST TECHNIQUE: Multidetector CT imaging of the chest was performed following the standard protocol without IV contrast. COMPARISON:  X-ray 12/14/2019, abdominal MRI 11/11/2019 FINDINGS: Technical note: Examination degraded by respiratory motion artifact. Cardiovascular: Mild cardiomegaly. No pericardial effusion. Thoracic aorta nonaneurysmal. Minimal aortic atherosclerotic calcification. Prominent coronary artery calcifications. Mediastinum/Nodes: No axillary lymphadenopathy. Scattered mediastinal lymph nodes. No mediastinal lymphadenopathy by size criteria. Evaluation of the hilar structures is  limited in the absence of intravenous contrast. Unremarkable thyroid, trachea, and esophagus. Lungs/Pleura: Trace left pleural effusion. No focal airspace consolidation. No pneumothorax. No evidence of edema. Upper Abdomen: Nodularity of the hepatic contour compatible with history of cirrhosis. Visualized portion of the spleen appears enlarged. Small amount of ascites within the upper abdomen. Musculoskeletal: Degenerative changes within the thoracic spine. Exaggerated thoracic kyphosis. No acute osseous finding. Mild bilateral gynecomastia IMPRESSION: 1. Trace left pleural effusion. No focal airspace consolidation. 2. Nodularity of the hepatic contour compatible with history of cirrhosis. Splenomegaly. Small amount of ascites within the upper abdomen. 3. Coronary atherosclerosis. Electronically Signed   By: Davina Poke D.O.   On: 12/14/2019 11:28   NM Pulmonary Perfusion  Result Date: 12/14/2019 CLINICAL DATA:  Shortness of breath EXAM: NUCLEAR MEDICINE PERFUSION LUNG SCAN TECHNIQUE: Perfusion images were obtained in multiple projections after intravenous injection of radiopharmaceutical. Ventilation scans intentionally deferred if perfusion scan and chest x-ray adequate for interpretation during COVID 19 epidemic. RADIOPHARMACEUTICALS:  4.47 mCi Tc-48mMAA IV COMPARISON:  Chest x-ray today FINDINGS: No segmental or subsegmental perfusion defects to suggest pulmonary embolus. IMPRESSION: No evidence of pulmonary embolus. Electronically Signed   By: KRolm BaptiseM.D.   On: 12/14/2019 15:07   UKoreaVenous Img Lower Unilateral Left (DVT)  Result Date: 11/27/2019 CLINICAL DATA:  Left leg swelling.  Concern for DVT. EXAM: LEFT LOWER EXTREMITY VENOUS DOPPLER ULTRASOUND TECHNIQUE: Gray-scale sonography with graded compression, as well as color Doppler and duplex ultrasound were performed to evaluate the lower extremity deep venous systems from the level of the common femoral vein and including the common  femoral, femoral, profunda femoral, popliteal and calf veins including the posterior tibial, peroneal and gastrocnemius veins when visible. The superficial great saphenous vein was also interrogated. Spectral Doppler was utilized to evaluate flow at rest and with distal augmentation maneuvers in the common femoral, femoral and popliteal veins. COMPARISON:  None. FINDINGS: Contralateral Common Femoral Vein: Respiratory phasicity is normal and symmetric with the symptomatic side. No evidence of thrombus. Normal compressibility. Common Femoral Vein: No evidence of thrombus. Normal compressibility, respiratory phasicity and response to augmentation. Saphenofemoral Junction: No evidence of thrombus. Normal compressibility and flow  on color Doppler imaging. Profunda Femoral Vein: No evidence of thrombus. Normal compressibility and flow on color Doppler imaging. Femoral Vein: No evidence of thrombus. Normal compressibility, respiratory phasicity and response to augmentation. Popliteal Vein: No evidence of thrombus. Normal compressibility, respiratory phasicity and response to augmentation. Calf Veins: The posterior tibial vein is unremarkable. The peroneal vein was not visualized. Superficial Great Saphenous Vein: No evidence of thrombus. Normal compressibility. Venous Reflux:  Not evaluated. Other Findings:  Left lower extremity edema is noted. IMPRESSION: 1. No DVT identified. 2. There is nonspecific lower extremity edema. Correlation with physical exam is recommended. Electronically Signed   By: Constance Holster M.D.   On: 11/27/2019 17:00   DG Chest Portable 1 View  Result Date: 12/14/2019 CLINICAL DATA:  Cough and shortness of breath EXAM: PORTABLE CHEST 1 VIEW COMPARISON:  April 28, 2019 FINDINGS: There is no edema or airspace opacity. There is cardiomegaly with central pulmonary vascular congestion and mild pulmonary venous hypertension. No adenopathy. No bone lesions. IMPRESSION: Stable cardiomegaly with a  degree of pulmonary vascular congestion. No frank edema or consolidation. No adenopathy. Electronically Signed   By: Lowella Grip III M.D.   On: 12/14/2019 09:32   Echo 10/2019 Left ventricle  Cavity size mildly dilated, mild concentric LVH/hypertrophy  Systolic function was normal with estimated ejection fraction 55 to 60%  Wall motion was normal, no regional wall motion abnormalities, grade 2  diastolic dysfunction   Left atrium  Mildly dilated   Right ventricle normal size with normal systolic function   Pulmonary arteries  Pulmonary arterial pressure could not be accurately assessed/estimated  given no significant tricuspid valve regurgitation   Inferior vena cava  Was moderately dilated, respirophasic diameter changes were blunted less  than 50% consistent with elevated central venous pressure   Assessment:   Corey West is a 69 y.o. male with cirrhosis, morbid obesity, chronic LE edema, volume overload, CKD admit with SOB and worsening LE edema and pain. Found to have Klebsiella oxytoca bacteremia.  This is likely from his LE wounds but can also be seen in liver abscesses and GI illness however he denies abd pain, change in his chronic diarrhea. CT chest did image part of liver and no abscess noted. Clinically responding nicely to ceftraixone and vanco. Wounds seem stable and he follows with [facility] and has unnawraps and a lymphedema pump at home.   Recommendations Cont ceftriaxone. Will await sensis for final abx rec.  If oral regimen can avoid PICC line and IV abx Can dc vanco given CKD and no evidence MRSA Given massive LE edema - Would attempt to diuresis aggressively over next few days while inpatient with IV lasix and close follow up of renal function. This will help heal his LE some and help with his SOB. Can also help prevent readmission.  I can see in fu once we have an abx regimen established and he is dced.  Thank you very much for allowing me to participate in the  care of this patient. Please call with questions.   Cheral Marker. Ola Spurr, MD

## 2019-12-16 NOTE — Progress Notes (Signed)
PROGRESS NOTE    Corey West  TKW:[id number] DOB: 08/13/1951 DOA: 12/14/2019 PCP: Baxter Hire, MD    Brief Narrative:  Corey West is a 69 y.o. male with medical history significant of hypertension, hyperlipidemia, diabetes mellitus, chronic lower extremity venous insufficiency change, GERD, anxiety, iron deficiency anemia, liver cirrhosis, AVM,dCHF, GI bleeding, CKD-3a, who presents with leg pain and swelling, SOB.    Consultants:     Procedures:  Antimicrobials:   Ceftriaxone   Subjective: Patient laying in bed.  Complaining of leg swelling.  No fever or chills.  No chest pain  Objective: Vitals:   12/15/19 1955 12/15/19 2351 12/16/19 0427 12/16/19 0800  BP: (!) 150/67 (!) 154/62 (!) 158/65 (!) 145/62  Pulse: 73 64 72 72  Resp: 19 17 17 15   Temp: (!) 97.5 F (36.4 C) 97.8 F (36.6 C) 98 F (36.7 C) 97.9 F (36.6 C)  TempSrc: Oral Oral Oral Oral  SpO2: 97% 97% 97% 98%  Weight:      Height:        Intake/Output Summary (Last 24 hours) at 12/16/2019 0901 Last data filed at 12/16/2019 0500 Gross per 24 hour  Intake 160.91 ml  Output 650 ml  Net -489.09 ml   Filed Weights   12/14/19 0917 12/15/19 0500  Weight: (!) 143.8 kg (!) 147.5 kg    Examination:  General exam: Appears calm and comfortable, NAD Respiratory system: Clear to auscultation. Respiratory effort normal. Cardiovascular system: S1 & S2 heard, RRR. No JVD, murmurs, rubs, gallops or clicks.  Gastrointestinal system: Abdomen is nondistended, soft and nontender. Normal bowel sounds heard. Central nervous system: Alert and oriented. No focal neurological deficits. Extremities: Right lower extremity wrapped.  Left lower extremity with swelling and erythema to lower thigh Skin: Warm dry Psychiatry: Judgement and insight appear normal. Mood & affect appropriate.     Data Reviewed: I have personally reviewed following labs and imaging studies  CBC: Recent Labs  Lab 12/14/19 0908  12/15/19 0521 12/16/19 0536  WBC 11.7* 11.2* 8.0  NEUTROABS 10.6*  --   --   HGB 8.2* 7.5* 7.5*  HCT 26.6* 24.0* 24.0*  MCV 97.8 97.6 96.8  PLT 172 109* 924*   Basic Metabolic Panel: Recent Labs  Lab 12/14/19 0908 12/15/19 0521 12/16/19 0536  NA 141 142 143  K 4.1 4.1 3.5  CL 108 111 112*  CO2 21* 22 25  GLUCOSE 111* 183* 101*  BUN 41* 45* 48*  CREATININE 2.77* 2.68* 2.52*  CALCIUM 8.5* 8.3* 8.4*   GFR: Estimated Creatinine Clearance: 42.5 mL/min (A) (by C-G formula based on SCr of 2.52 mg/dL (H)). Liver Function Tests: Recent Labs  Lab 12/14/19 0908 12/16/19 0536  AST 29 31  ALT 19 19  ALKPHOS 80 62  BILITOT 0.8 0.7  PROT 6.9 6.1*  ALBUMIN 2.7* 2.3*   No results for input(s): LIPASE, AMYLASE in the last 168 hours. Recent Labs  Lab 12/14/19 0908  AMMONIA 30   Coagulation Profile: Recent Labs  Lab 12/14/19 1922  INR 1.4*   Cardiac Enzymes: No results for input(s): CKTOTAL, CKMB, CKMBINDEX, TROPONINI in the last 168 hours. BNP (last 3 results) No results for input(s): PROBNP in the last 8760 hours. HbA1C: Recent Labs    12/15/19 0521  HGBA1C 6.6*   CBG: Recent Labs  Lab 12/15/19 1157 12/15/19 1623 12/15/19 2035 12/16/19 0008 12/16/19 0421  GLUCAP 187* 198* 133* 122* 96   Lipid Profile: Recent Labs    12/15/19 0521  CHOL 141  HDL 37*  LDLCALC 93  TRIG 56  CHOLHDL 3.8   Thyroid Function Tests: No results for input(s): TSH, T4TOTAL, FREET4, T3FREE, THYROIDAB in the last 72 hours. Anemia Panel: No results for input(s): VITAMINB12, FOLATE, FERRITIN, TIBC, IRON, RETICCTPCT in the last 72 hours. Sepsis Labs: Recent Labs  Lab 12/14/19 0908 12/14/19 1206 12/14/19 1922 12/14/19 2218  PROCALCITON  --  2.66  --   --   LATICACIDVEN 2.3* 1.4 1.8 1.6    Recent Results (from the past 240 hour(s))  Blood culture (single)     Status: Abnormal (Preliminary result)   Collection Time: 12/14/19  9:05 AM   Specimen: BLOOD  Result Value Ref  Range Status   Specimen Description   Final    BLOOD RIGHT FA Performed at [facility], [address], [place], [place] [address]    Special Requests   Final    BOTTLES DRAWN AEROBIC AND ANAEROBIC Blood Culture adequate volume Performed at [facility], [address] [place], [place] [address]    Culture  Setup Time   Final    AEROBIC BOTTLE ONLY GRAM NEGATIVE RODS CRITICAL RESULT CALLED TO, READ BACK BY AND VERIFIED WITH: Hart Robinsons PHARMD 2344 12/14/19 HNM    Culture (A)  Final    KLEBSIELLA OXYTOCA SUSCEPTIBILITIES TO FOLLOW Performed at [facility], [place] [address] [place], [place] [address]    Report Status PENDING  Incomplete  Blood Culture ID Panel (Reflexed)     Status: Abnormal   Collection Time: 12/14/19  9:05 AM  Result Value Ref Range Status   Enterococcus species NOT DETECTED NOT DETECTED Final   Listeria monocytogenes NOT DETECTED NOT DETECTED Final   Staphylococcus species NOT DETECTED NOT DETECTED Final   Staphylococcus aureus (BCID) NOT DETECTED NOT DETECTED Final   Streptococcus species NOT DETECTED NOT DETECTED Final   Streptococcus agalactiae NOT DETECTED NOT DETECTED Final   Streptococcus pneumoniae NOT DETECTED NOT DETECTED Final   Streptococcus pyogenes NOT DETECTED NOT DETECTED Final   Acinetobacter baumannii NOT DETECTED NOT DETECTED Final   Enterobacteriaceae species DETECTED (A) NOT DETECTED Final    Comment: Enterobacteriaceae represent a large family of gram-negative bacteria, not a single organism. CRITICAL RESULT CALLED TO, READ BACK BY AND VERIFIED WITH: SCOTT HALL PHARMD 2344 12/14/19 HNM    Enterobacter cloacae complex NOT DETECTED NOT DETECTED Final   Escherichia coli NOT DETECTED NOT DETECTED Final   Klebsiella oxytoca DETECTED (A) NOT DETECTED Final    Comment: CRITICAL RESULT CALLED TO, READ BACK BY AND VERIFIED WITH: SCOTT HALL PHARMD 2344 12/14/19 HNM    Klebsiella pneumoniae NOT DETECTED NOT DETECTED Final    Proteus species NOT DETECTED NOT DETECTED Final   Serratia marcescens NOT DETECTED NOT DETECTED Final   Carbapenem resistance NOT DETECTED NOT DETECTED Final   Haemophilus influenzae NOT DETECTED NOT DETECTED Final   Neisseria meningitidis NOT DETECTED NOT DETECTED Final   Pseudomonas aeruginosa NOT DETECTED NOT DETECTED Final   Candida albicans NOT DETECTED NOT DETECTED Final   Candida glabrata NOT DETECTED NOT DETECTED Final   Candida krusei NOT DETECTED NOT DETECTED Final   Candida parapsilosis NOT DETECTED NOT DETECTED Final   Candida tropicalis NOT DETECTED NOT DETECTED Final    Comment: Performed at [facility], [place] [place], [place] [address]  SARS CORONAVIRUS 2 (TAT 6-24 HRS) Nasopharyngeal Nasopharyngeal Swab     Status: None   Collection Time: 12/14/19  9:29 AM  Specimen: Nasopharyngeal Swab  Result Value Ref Range Status   SARS Coronavirus 2 NEGATIVE NEGATIVE Final    Comment: (NOTE) SARS-CoV-2 target nucleic acids are NOT DETECTED. The SARS-CoV-2 RNA is generally detectable in upper and lower respiratory specimens during the acute phase of infection. Negative results do not preclude SARS-CoV-2 infection, do not rule out co-infections with other pathogens, and should not be used as the sole basis for treatment or other patient management decisions. Negative results must be combined with clinical observations, patient history, and epidemiological information. The expected result is Negative. Fact Sheet for Patients: SugarRoll.be Fact Sheet for Healthcare Providers: https://www.woods-mathews.com/ This test is not yet approved or cleared by the [place] FDA and  has been authorized for detection and/or diagnosis of SARS-CoV-2 by FDA under an Emergency Use Authorization (EUA). This EUA will remain  in effect (meaning this test can be used) for the duration of the COVID-19 declaration under Section 56  4(b)(1) of the Act, 21 U.S.C. section 360bbb-3(b)(1), unless the authorization is terminated or revoked sooner. Performed at [facility], [place] [address] [place], [place] [address]          Radiology Studies: CT Chest Wo Contrast  Result Date: 12/14/2019 CLINICAL DATA:  Cough, shortness of breath EXAM: CT CHEST WITHOUT CONTRAST TECHNIQUE: Multidetector CT imaging of the chest was performed following the standard protocol without IV contrast. COMPARISON:  X-ray 12/14/2019, abdominal MRI 11/11/2019 FINDINGS: Technical note: Examination degraded by respiratory motion artifact. Cardiovascular: Mild cardiomegaly. No pericardial effusion. Thoracic aorta nonaneurysmal. Minimal aortic atherosclerotic calcification. Prominent coronary artery calcifications. Mediastinum/Nodes: No axillary lymphadenopathy. Scattered mediastinal lymph nodes. No mediastinal lymphadenopathy by size criteria. Evaluation of the hilar structures is limited in the absence of intravenous contrast. Unremarkable thyroid, trachea, and esophagus. Lungs/Pleura: Trace left pleural effusion. No focal airspace consolidation. No pneumothorax. No evidence of edema. Upper Abdomen: Nodularity of the hepatic contour compatible with history of cirrhosis. Visualized portion of the spleen appears enlarged. Small amount of ascites within the upper abdomen. Musculoskeletal: Degenerative changes within the thoracic spine. Exaggerated thoracic kyphosis. No acute osseous finding. Mild bilateral gynecomastia IMPRESSION: 1. Trace left pleural effusion. No focal airspace consolidation. 2. Nodularity of the hepatic contour compatible with history of cirrhosis. Splenomegaly. Small amount of ascites within the upper abdomen. 3. Coronary atherosclerosis. Electronically Signed   By: Davina Poke D.O.   On: 12/14/2019 11:28   NM Pulmonary Perfusion  Result Date: 12/14/2019 CLINICAL DATA:  Shortness of breath EXAM: NUCLEAR MEDICINE PERFUSION LUNG SCAN  TECHNIQUE: Perfusion images were obtained in multiple projections after intravenous injection of radiopharmaceutical. Ventilation scans intentionally deferred if perfusion scan and chest x-ray adequate for interpretation during COVID 19 epidemic. RADIOPHARMACEUTICALS:  4.47 mCi Tc-48m MAA IV COMPARISON:  Chest x-ray today FINDINGS: No segmental or subsegmental perfusion defects to suggest pulmonary embolus. IMPRESSION: No evidence of pulmonary embolus. Electronically Signed   By: Rolm Baptise M.D.   On: 12/14/2019 15:07   DG Chest Portable 1 View  Result Date: 12/14/2019 CLINICAL DATA:  Cough and shortness of breath EXAM: PORTABLE CHEST 1 VIEW COMPARISON:  April 28, 2019 FINDINGS: There is no edema or airspace opacity. There is cardiomegaly with central pulmonary vascular congestion and mild pulmonary venous hypertension. No adenopathy. No bone lesions. IMPRESSION: Stable cardiomegaly with a degree of pulmonary vascular congestion. No frank edema or consolidation. No adenopathy. Electronically Signed   By: Lowella Grip III M.D.   On: 12/14/2019 09:32        Scheduled  Meds: . atorvastatin  20 mg Oral q1800  . dextromethorphan-guaiFENesin  1 tablet Oral BID  . ferrous sulfate  325 mg Oral BID WC  . furosemide  80 mg Oral BID  . heparin  5,000 Units Subcutaneous Q8H  . insulin aspart  0-9 Units Subcutaneous Q4H  . insulin aspart protamine- aspart  60 Units Subcutaneous BID AC  . lactulose  30 g Oral QID  . loratadine  10 mg Oral Daily  . multivitamin with minerals  1 tablet Oral Daily  . pantoprazole  40 mg Oral BID   Continuous Infusions: . sodium chloride Stopped (12/15/19 1348)  . cefTRIAXone (ROCEPHIN)  IV Stopped (12/15/19 1250)  . vancomycin 1,000 mg (12/15/19 2107)    Assessment & Plan:   Principal Problem:   Cellulitis of lower extremity Active Problems:   Gastroesophageal reflux disease   Iron deficiency anemia   Sepsis (HCC)   Acute renal failure superimposed on stage  3a chronic kidney disease (HCC)   HTN (hypertension)   Chronic diastolic CHF (congestive heart failure) (HCC)   Type II diabetes mellitus with renal manifestations (HCC)   Liver cirrhosis (HCC)   Anxiety   SOB (shortness of breath)   Elevated troponin   Sepsis due to cellulitis of lower extremity: Patient admits critical for sepsis with leukocytosis, tachycardia, tachypnea.  Elevated lactic acid.  Currently hemodynamically stable Bacteremic with Klebsiella oxytoca Consult ID- rec. Continue Rocephin and vanco Follow-up sensitivity Can DC Vanco given chronic kidney disease and no evidence of MRSA    Cirrhosis (HCC):Mental status normal. Ammonia 30 -continue homelactulose - INR 1.4 APTT 34  Gastroesophageal reflux disease -Protonix  Iron deficiency anemia -Continue iron supplement  CKD (chronic kidney disease), stage IIIa:slightly worsening. Baseline creatinine 2.0-2.5.  Creatinine today is 2.52 which is at baseline Continue to monitor  HTN:   -Continue home medications:lasix,  which is also for CHF -hydralazine prn  Chronic diastolic CHF (congestive heart failure) (HCC):2D echo on 10/16/2018 showed EF of 55-60% with grade 2 diastolic dysfunction. pt has SOB.  Has bilateral lower leg edema, indicating posssible fluid overload.  Since patient has sepsis, elevated lactic acid and slightly worsening renal function, will not escalate diuresis. -continue home dose lasix   Type II diabetes mellitus with renal manifestations ([place]):Most recent A1c7.5, poorly controled. Patient is taking70/30 insulinat home -will decrease70/30 insulin dose from 84 to 60 units twice daily -SSI  Elevated troponin: trop 22 -->191 -->539. No CP.  Possibly due to demand ischemia in the setting of a worsening renal function -Started Lipitor 20 mg daily -Will not start aspirin due to history of GI bleeding and esophageal varices -Trend troponin -ck echo Consult cards     DVT  prophylaxis: Heparin Code Status: Full Family Communication: None at bedside Disposition Plan: will need PT , d/c home v.s rehab. Still needs treatment for bacteremia . Echo pending. Cards consulted Barrier:needs iv abx still as not able at this time to place on po antibiotic until sensitivity comes back as he is bacteremic.  Echocardiogram pending.  Cardiology consulted.  Likely will be here 1-2 more days.     LOS: 2 days   Time spent: 45 minutes    Nolberto Hanlon, MD [facility] Pager [phone]  If 7PM-7AM, please contact night-coverage [email or web address] Password [facility] 12/16/2019, 9:01 AM

## 2019-12-16 NOTE — Progress Notes (Signed)
MD Amery, paged and called back states to discontinue troponin q2 checks and to use a bigger cuff to check his blood pressure. She just wanted to check where troponin was at this time since it was in the 500s two days ago

## 2019-12-17 DIAGNOSIS — I10 Essential (primary) hypertension: Secondary | ICD-10-CM

## 2019-12-17 LAB — GLUCOSE, CAPILLARY
Glucose-Capillary: 98 mg/dL (ref 70–99)
Glucose-Capillary: 85 mg/dL (ref 70–99)
Glucose-Capillary: 113 mg/dL — ABNORMAL HIGH (ref 70–99)

## 2019-12-17 LAB — BRAIN NATRIURETIC PEPTIDE: B Natriuretic Peptide: 256 pg/mL — ABNORMAL HIGH (ref 0.0–100.0)

## 2019-12-17 LAB — CULTURE, BLOOD (SINGLE): Special Requests: ADEQUATE

## 2019-12-17 LAB — ECHOCARDIOGRAM COMPLETE
Height: 72 in
Weight: 5202.86 oz

## 2019-12-17 MED ORDER — CIPROFLOXACIN HCL 500 MG PO TABS
500.0000 mg | ORAL_TABLET | Freq: Every day | ORAL | Status: DC
  Administered 2019-12-17: 12:00:00 500 mg via ORAL
  Filled 2019-12-17: qty 1

## 2019-12-17 MED ORDER — HYDRALAZINE HCL 25 MG PO TABS: 25.0000 mg | ORAL_TABLET | Freq: Three times a day (TID) | ORAL | 0 refills | Status: DC

## 2019-12-17 MED ORDER — ATORVASTATIN CALCIUM 10 MG PO TABS: 20.0000 mg | ORAL_TABLET | Freq: Every day | ORAL | 0 refills | Status: DC

## 2019-12-17 MED ORDER — CIPROFLOXACIN HCL 500 MG PO TABS: 500.0000 mg | ORAL_TABLET | Freq: Every day | ORAL | 0 refills | Status: AC

## 2019-12-17 NOTE — Care Management Important Message (Signed)
Important Message  Patient Details  Name: Corey West MRN: [id number] Date of Birth: 07-13-1951   Medicare Important Message Given:  Yes Patient is aware of Medicare rights.      Shelbie Ammons, RN 12/17/2019, 10:55 AM

## 2019-12-17 NOTE — TOC Progression Note (Signed)
Transition of Care [facility]) - Progression Note    Patient Details  Name: Corey West MRN: [id number] Date of Birth: 11-Jan-1951  Transition of Care [facility]) CM/SW [place], RN Phone Number: 12/17/2019, 12:06 PM  Clinical Narrative:    Spoke with the patient, he wants to DC today and wants the nurse to call him a taxi.  He is set up with [facility] for [facility] services already and will continue He stated that he does not need anything else        Expected Discharge Plan and Services           Expected Discharge Date: 12/17/19                                     Social Determinants of Health (SDOH) Interventions    Readmission Risk Interventions Readmission Risk Prevention Plan 12/15/2019 02/03/2019 01/15/2019  Transportation Screening Complete Complete Complete  HRI or Home Care Consult - - Complete  Medication Review Press photographer) Complete Complete Complete  PCP or Specialist appointment within 3-5 days of discharge Complete - -  [place] or Home Care Consult Complete Complete -  SW Recovery Care/Counseling Consult - Not Complete -  SW Consult Not Complete Comments - NA -  Palliative Care Screening Not Applicable Not Applicable -  [place] Not Applicable Not Applicable -  Some recent data might be hidden

## 2019-12-17 NOTE — Progress Notes (Signed)
[place] INFECTIOUS DISEASE PROGRESS NOTE Date of Admission:  12/14/2019     ID: Corey West is a 69 y.o. male with GNR bacteremia   *Principal Problem:   Cellulitis of lower extremity Active Problems:   Gastroesophageal reflux disease   Iron deficiency anemia   Sepsis ([place])   Acute renal failure superimposed on stage 3a chronic kidney disease (HCC)   HTN (hypertension)   Chronic diastolic CHF (congestive heart failure) (HCC)   Type II diabetes mellitus with renal manifestations (HCC)   Liver cirrhosis (HCC)   Anxiety   SOB (shortness of breath)   Elevated troponin   Subjective: No fevers, doing better. Less swelling  ROS  Eleven systems are reviewed and negative except per hpi  Medications:  Antibiotics Given (last 72 hours)    Date/Time Action Medication Dose Rate   12/14/19 1211 New Bag/Given   vancomycin (VANCOCIN) IVPB 1000 mg/200 mL premix 1,000 mg 200 mL/hr   12/14/19 1213 New Bag/Given   cefTRIAXone (ROCEPHIN) 2 g in sodium chloride 0.9 % 100 mL IVPB 2 g 200 mL/hr   12/14/19 2016 New Bag/Given   vancomycin (VANCOREADY) IVPB 1500 mg/300 mL 1,500 mg 150 mL/hr   12/15/19 1219 New Bag/Given   cefTRIAXone (ROCEPHIN) 2 g in sodium chloride 0.9 % 100 mL IVPB 2 g 200 mL/hr   12/15/19 2107 New Bag/Given   vancomycin (VANCOCIN) IVPB 1000 mg/200 mL premix 1,000 mg 200 mL/hr   12/16/19 1226 New Bag/Given   cefTRIAXone (ROCEPHIN) 2 g in sodium chloride 0.9 % 100 mL IVPB  200 mL/hr     . atorvastatin  20 mg Oral q1800  . dextromethorphan-guaiFENesin  1 tablet Oral BID  . ferrous sulfate  325 mg Oral BID WC  . furosemide  80 mg Oral BID  . heparin  5,000 Units Subcutaneous Q8H  . insulin aspart  0-9 Units Subcutaneous Q4H  . insulin aspart protamine- aspart  60 Units Subcutaneous BID AC  . lactulose  30 g Oral QID  . loratadine  10 mg Oral Daily  . multivitamin with minerals  1 tablet Oral Daily  . pantoprazole  40 mg Oral BID    Objective: Vital signs in last  24 hours: Temp:  [97.9 F (36.6 C)-98.3 F (36.8 C)] 98 F (36.7 C) (03/18 0812) Pulse Rate:  [67-75] 69 (03/18 0812) Resp:  [16-18] 18 (03/18 0812) BP: (135-171)/(50-68) 146/60 (03/18 0812) SpO2:  [96 %-100 %] 96 % (03/18 0812) Constitutional: He is oriented to person, place, and time. He appears well-developed and well-nourished. No distress. obese HENT: anicteric Mouth/Throat: Oropharynx is clear and moist. No oropharyngeal exudate.  Cardiovascular: Normal rate, regular rhythm and normal heart sounds. Exam reveals no gallop and no friction rub.  No murmur heard.  Pulmonary/Chest: Effort normal and breath sounds normal. No respiratory distress. He has no wheezes.  Abdominal: obese Soft. Bowel sounds are normal. He exhibits no distension. There is no tenderness.  Lymphadenopathy: He has no cervical adenopathy.  Neurological: He is alert and oriented to person, place, and time.  Skin: RLE with unnawrap. LLE with mild erythema, scaly skin but no open wounds Psychiatric: He has a normal mood and affect. His behavior is normal.  Ext 3+ edema - to upper thighs   Lab Results Recent Labs    12/15/19 0521 12/16/19 0536  WBC 11.2* 8.0  HGB 7.5* 7.5*  HCT 24.0* 24.0*  NA 142 143  K 4.1 3.5  CL 111 112*  CO2 22 25  BUN 45* 48*  CREATININE 2.68* 2.52*    Microbiology: Results for orders placed or performed during the hospital encounter of 12/14/19  Blood culture (single)     Status: Abnormal   Collection Time: 12/14/19  9:05 AM   Specimen: BLOOD  Result Value Ref Range Status   Specimen Description   Final    BLOOD RIGHT FA Performed at [facility], [address], [place], [place] [address]    Special Requests   Final    BOTTLES DRAWN AEROBIC AND ANAEROBIC Blood Culture adequate volume Performed at [facility], [address] [place], [place] [address]    Culture  Setup Time   Final    AEROBIC BOTTLE ONLY GRAM NEGATIVE RODS CRITICAL RESULT CALLED  TO, READ BACK BY AND VERIFIED WITH: SCOTT HALL PHARMD 2344 12/14/19 HNM    Culture KLEBSIELLA OXYTOCA (A)  Final   Report Status 12/17/2019 FINAL  Final   Organism ID, Bacteria KLEBSIELLA OXYTOCA  Final      Susceptibility   Klebsiella oxytoca - MIC*    AMPICILLIN >=32 RESISTANT Resistant     CEFAZOLIN <=4 SENSITIVE Sensitive     CEFEPIME <=0.12 SENSITIVE Sensitive     CEFTAZIDIME <=1 SENSITIVE Sensitive     CEFTRIAXONE <=0.25 SENSITIVE Sensitive     CIPROFLOXACIN 0.5 SENSITIVE Sensitive     GENTAMICIN <=1 SENSITIVE Sensitive     IMIPENEM <=0.25 SENSITIVE Sensitive     TRIMETH/SULFA >=320 RESISTANT Resistant     AMPICILLIN/SULBACTAM 8 SENSITIVE Sensitive     PIP/TAZO <=4 SENSITIVE Sensitive     * KLEBSIELLA OXYTOCA  Blood Culture ID Panel (Reflexed)     Status: Abnormal   Collection Time: 12/14/19  9:05 AM  Result Value Ref Range Status   Enterococcus species NOT DETECTED NOT DETECTED Final   Listeria monocytogenes NOT DETECTED NOT DETECTED Final   Staphylococcus species NOT DETECTED NOT DETECTED Final   Staphylococcus aureus (BCID) NOT DETECTED NOT DETECTED Final   Streptococcus species NOT DETECTED NOT DETECTED Final   Streptococcus agalactiae NOT DETECTED NOT DETECTED Final   Streptococcus pneumoniae NOT DETECTED NOT DETECTED Final   Streptococcus pyogenes NOT DETECTED NOT DETECTED Final   Acinetobacter baumannii NOT DETECTED NOT DETECTED Final   Enterobacteriaceae species DETECTED (A) NOT DETECTED Final    Comment: Enterobacteriaceae represent a large family of gram-negative bacteria, not a single organism. CRITICAL RESULT CALLED TO, READ BACK BY AND VERIFIED WITH: SCOTT HALL PHARMD 2344 12/14/19 HNM    Enterobacter cloacae complex NOT DETECTED NOT DETECTED Final   Escherichia coli NOT DETECTED NOT DETECTED Final   Klebsiella oxytoca DETECTED (A) NOT DETECTED Final    Comment: CRITICAL RESULT CALLED TO, READ BACK BY AND VERIFIED WITH: SCOTT HALL PHARMD 2344 12/14/19 HNM     Klebsiella pneumoniae NOT DETECTED NOT DETECTED Final   Proteus species NOT DETECTED NOT DETECTED Final   Serratia marcescens NOT DETECTED NOT DETECTED Final   Carbapenem resistance NOT DETECTED NOT DETECTED Final   Haemophilus influenzae NOT DETECTED NOT DETECTED Final   Neisseria meningitidis NOT DETECTED NOT DETECTED Final   Pseudomonas aeruginosa NOT DETECTED NOT DETECTED Final   Candida albicans NOT DETECTED NOT DETECTED Final   Candida glabrata NOT DETECTED NOT DETECTED Final   Candida krusei NOT DETECTED NOT DETECTED Final   Candida parapsilosis NOT DETECTED NOT DETECTED Final   Candida tropicalis NOT DETECTED NOT DETECTED Final    Comment: Performed at [facility], [address], [place], [place] [address]  SARS CORONAVIRUS 2 (TAT 6-24 HRS) Nasopharyngeal Nasopharyngeal Swab     Status: None   Collection Time: 12/14/19  9:29 AM   Specimen: Nasopharyngeal Swab  Result Value Ref Range Status   SARS Coronavirus 2 NEGATIVE NEGATIVE Final    Comment: (NOTE) SARS-CoV-2 target nucleic acids are NOT DETECTED. The SARS-CoV-2 RNA is generally detectable in upper and lower respiratory specimens during the acute phase of infection. Negative results do not preclude SARS-CoV-2 infection, do not rule out co-infections with other pathogens, and should not be used as the sole basis for treatment or other patient management decisions. Negative results must be combined with clinical observations, patient history, and epidemiological information. The expected result is Negative. Fact Sheet for Patients: SugarRoll.be Fact Sheet for Healthcare Providers: https://www.woods-mathews.com/ This test is not yet approved or cleared by the [place] FDA and  has been authorized for detection and/or diagnosis of SARS-CoV-2 by FDA under an Emergency Use Authorization (EUA). This EUA will remain  in effect (meaning this test can be used) for the  duration of the COVID-19 declaration under Section 56 4(b)(1) of the Act, 21 U.S.C. section 360bbb-3(b)(1), unless the authorization is terminated or revoked sooner. Performed at [facility], [address] [place], [place] [address]     Studies/Results: ECHOCARDIOGRAM COMPLETE  Result Date: 12/17/2019    ECHOCARDIOGRAM REPORT   Patient Name:   Corey West Date of Exam: 12/16/2019 Medical Rec #:  [id number]    Height:       72.0 in Accession #:    [id number]   Weight:       325.2 lb Date of Birth:  20-Jun-1951     BSA:          2.619 m Patient Age:    27 years     BP:           135/50 mmHg Patient Gender: M            HR:           79 bpm. Exam Location:  [facility] Procedure: 2D Echo, Cardiac Doppler, Color Doppler and Intracardiac            Opacification Agent Indications:     R94.31 Abnormal EKG  History:         Patient has prior history of Echocardiogram examinations, most                  recent 10/16/2018. Risk Factors:Hypertension, Dyslipidemia,                  Diabetes and Morbid Obesity. Chronic kidney disease. Sleep                  apnea. Edema. Cirrhosis.  Sonographer:     Wilford Sports Rodgers-Jones Referring Phys:  [id number] [place] Diagnosing Phys: Bartholome Bill MD IMPRESSIONS  1. Left ventricular ejection fraction, by estimation, is 60 to 65%. The left ventricle has normal function. The left ventricle has no regional wall motion abnormalities. There is moderate left ventricular hypertrophy. Left ventricular diastolic parameters are consistent with Grade I diastolic dysfunction (impaired relaxation).  2. Right ventricular systolic function is normal. The right ventricular size is mildly enlarged.  3. Left atrial size was mildly dilated.  4. The mitral valve was not well visualized. Trivial mitral valve regurgitation.  5. The aortic valve was not well visualized. Aortic valve regurgitation is trivial. Mild to moderate aortic valve sclerosis/calcification is present, without any evidence of  aortic stenosis. FINDINGS  Left Ventricle: Left ventricular ejection fraction, by estimation, is 60 to 65%. The left ventricle has normal function. The left ventricle has no regional wall motion abnormalities. Definity contrast agent was given IV to delineate the left ventricular  endocardial borders. The left ventricular internal cavity size was normal in size. There is moderate left ventricular hypertrophy. Left ventricular diastolic parameters are consistent with Grade I diastolic dysfunction (impaired relaxation). Right Ventricle: The right ventricular size is mildly enlarged. No increase in right ventricular wall thickness. Right ventricular systolic function is normal. Left Atrium: Left atrial size was mildly dilated. Right Atrium: Right atrial size was normal in size. Pericardium: There is no evidence of pericardial effusion. Mitral Valve: The mitral valve was not well visualized. Trivial mitral valve regurgitation. Tricuspid Valve: The tricuspid valve is not well visualized. Tricuspid valve regurgitation is mild. Aortic Valve: The aortic valve was not well visualized. Aortic valve regurgitation is trivial. Mild to moderate aortic valve sclerosis/calcification is present, without any evidence of aortic stenosis. Aortic valve mean gradient measures 10.5 mmHg. Aortic valve peak gradient measures 18.1 mmHg. Aortic valve area, by VTI measures 2.78 cm. Pulmonic Valve: The pulmonic valve was not well visualized. Pulmonic valve regurgitation is not visualized. Aorta: The aortic root was not well visualized. IAS/Shunts: The interatrial septum was not assessed.  LEFT VENTRICLE PLAX 2D LVIDd:         5.37 cm  Diastology LVIDs:         3.60 cm  LV e' lateral:   7.40 cm/s LV PW:         1.13 cm  LV E/e' lateral: 20.4 LV IVS:        1.16 cm  LV e' medial:    8.81 cm/s LVOT diam:     2.20 cm  LV E/e' medial:  17.1 LV SV:         122 LV SV Index:   47 LVOT Area:     3.80 cm  RIGHT VENTRICLE RV Basal diam:  5.28 cm RV S  prime:     15.55 cm/s TAPSE (M-mode): 2.4 cm LEFT ATRIUM             Index       RIGHT ATRIUM           Index LA diam:        5.10 cm 1.95 cm/m  RA Area:     15.30 cm LA Vol (A2C):   94.7 ml 36.15 ml/m RA Volume:   39.20 ml  14.97 ml/m LA Vol (A4C):   72.8 ml 27.79 ml/m LA Biplane Vol: 86.2 ml 32.91 ml/m  AORTIC VALVE AV Area (Vmax):    2.50 cm AV Area (Vmean):   2.61 cm AV Area (VTI):     2.78 cm AV Vmax:           212.50 cm/s AV Vmean:          151.500 cm/s AV VTI:            0.440 m AV Peak Grad:      18.1 mmHg AV Mean Grad:      10.5 mmHg LVOT Vmax:         140.00 cm/s LVOT Vmean:        104.000 cm/s LVOT VTI:          0.322 m LVOT/AV VTI ratio: 0.73  AORTA Ao Root diam: 3.40 cm MITRAL VALVE MV Area (PHT): 2.91 cm  SHUNTS MV Decel Time: 261 msec     Systemic VTI:  0.32 m MV E velocity: 151.00 cm/s  Systemic Diam: 2.20 cm MV A velocity: 127.00 cm/s MV E/A ratio:  1.19 Bartholome Bill MD Electronically signed by Bartholome Bill MD Signature Date/Time: 12/17/2019/8:07:35 AM    Final     Assessment/Plan: Corey West is a 69 y.o. male with cirrhosis, morbid obesity, chronic LE edema, volume overload, CKD admit with SOB and worsening LE edema and pain. Found to have Klebsiella oxytoca bacteremia.  This is likely from his LE wounds but can also be seen in liver abscesses and GI illness however he denies abd pain, change in his chronic diarrhea. CT chest did image part of liver and no abscess noted. Clinically responding nicely to ceftraixone and vanco. Wounds seem stable and he follows with [facility] and has unnawraps and a lymphedema pump at home.  3/18 doing better.  No fevers Recommendations Can use cipro 500 po qd Given massive LE edema - he should continue diuresis, elevation, unnawraps- goes to [facility] and lymphedema pumps  I can see in fu once in 2-3 weeks in clinic Thank you very much for the consult. Will follow with you.  Leonel Ramsay   12/17/2019, 10:42 AM

## 2019-12-17 NOTE — Consult Note (Signed)
WOC reviewed chart.  Will plan to change Unna's boot to the RLE Friday and apply to LLE if improved.   [place], [place], [place]

## 2019-12-17 NOTE — Discharge Summary (Addendum)
Corey West ATF:[id number] DOB: May 28, 1951 DOA: 12/14/2019  PCP: Baxter Hire, MD  Admit date: 12/14/2019 Discharge date: 12/17/2019  Admitted From: Home Disposition: Home  Recommendations for Outpatient Follow-up:  1. Follow up with PCP in 1 week 2. Please obtain BMP/CBC in one week 3. Follow-up with Dr. Ubaldo Glassing cardiology in 1 week 4. Follow-up with infectious disease in 2 weeks 5.will need to Resume home health    Discharge Condition:Stable CODE STATUS full Diet recommendation: Heart Healthy  Brief/Interim Summary: Corey West is a 69 y.o. male with medical history significant of hypertension, hyperlipidemia, diabetes mellitus, chronic lower extremity venous insufficiency change, GERD, anxiety, iron deficiency anemia, liver cirrhosis, AVM,dCHF, GI bleeding, CKD-3a, who presented with leg pain and swelling, SOB.Chest x-ray showed cardiomegaly and vascular congestion.  CT of the chest showed trace left pleural effusion, no infiltration.  Patient on admission was found with sepsis due to cellulitis of lower extremity.  He was started on empiric antimicrobial therapy.  His ammonia level was 30 he was continued on his home lactulose. And on his Lasix home dose.  He was found with elevated troponin.  Echo revealed normal EF.  Cardiology was consulted this was due to demand ischemia and did not feel patient needed ischemic work-up and needed to follow-up as outpatient.  Infectious disease was consulted because patient was found with Klebsiella oxytoca bacteremia.  They recommended ciprofloxacin 500 mg daily for 10 days and to follow-up with ID in couple of weeks.  He will need to follow-up with wound care clinic as he usually does to get wraps for his left leg and lymphedema pumps.  Discharge Diagnoses:  Principal Problem:   Cellulitis of lower extremity Active Problems:   Gastroesophageal reflux disease   Iron deficiency anemia   Sepsis (HCC)   Acute renal failure superimposed on stage 3a  chronic kidney disease (HCC)   HTN (hypertension)   Chronic diastolic CHF (congestive heart failure) (HCC)   Type II diabetes mellitus with renal manifestations (HCC)   Liver cirrhosis (HCC)   Anxiety   SOB (shortness of breath)   Elevated troponin    Discharge Instructions  Discharge Instructions    Call MD for:  difficulty breathing, headache or visual disturbances   Complete by: As directed    Call MD for:  severe uncontrolled pain   Complete by: As directed    Diet - low sodium heart healthy   Complete by: As directed    Discharge instructions   Complete by: As directed    Follow up with pcp in one week F/u with cardiology one week F/u with Dr. Ola Spurr ID in 2 weeks   Increase activity slowly   Complete by: As directed      Allergies as of 12/17/2019   No Known Allergies     Medication List    TAKE these medications   atorvastatin 10 MG tablet Commonly known as: LIPITOR Take 2 tablets (20 mg total) by mouth daily at 6 PM.   cetirizine 10 MG tablet Commonly known as: ZYRTEC Take 10 mg by mouth daily.   ciprofloxacin 500 MG tablet Commonly known as: CIPRO Take 1 tablet (500 mg total) by mouth daily for 10 days.   ferrous sulfate 325 (65 FE) MG tablet Take 325 mg by mouth 2 (two) times daily with a meal.   furosemide 40 MG tablet Commonly known as: LASIX Take 80 mg by mouth 2 (two) times a day.   hydrALAZINE 25 MG tablet Commonly known  as: APRESOLINE Take 1 tablet (25 mg total) by mouth 3 (three) times daily.   insulin aspart protamine - aspart (70-30) 100 UNIT/ML FlexPen Commonly known as: NOVOLOG 70/30 MIX Inject 84 Units into the skin 2 (two) times daily.   lactulose 10 GM/15ML solution Commonly known as: CHRONULAC Take 30 g by mouth 4 (four) times daily.   LORazepam 0.5 MG tablet Commonly known as: ATIVAN Take 1 tablet (0.5 mg total) by mouth every 8 (eight) hours as needed for anxiety.   multivitamin tablet Take 1 tablet by mouth  daily.   pantoprazole 40 MG tablet Commonly known as: Protonix Take 1 tablet (40 mg total) by mouth 2 (two) times daily.   Potassium Chloride ER 20 MEQ Tbcr Take 20 mEq by mouth 2 (two) times daily. While on lasix' What changed: additional instructions   traMADol 50 MG tablet Commonly known as: ULTRAM Take 50 mg by mouth every 6 (six) hours as needed for moderate pain.      Follow-up Information    Leonel Ramsay, MD Follow up in 2 week(s).   Specialty: Infectious Diseases Contact information: [place] [address] [phone]        Teodoro Spray, MD Follow up in 1 week(s).   Specialty: Cardiology Contact information: [place] [address] [phone]          No Known Allergies  Consultations:  Cardiology, ID   Procedures/Studies: CT Chest Wo Contrast  Result Date: 12/14/2019 CLINICAL DATA:  Cough, shortness of breath EXAM: CT CHEST WITHOUT CONTRAST TECHNIQUE: Multidetector CT imaging of the chest was performed following the standard protocol without IV contrast. COMPARISON:  X-ray 12/14/2019, abdominal MRI 11/11/2019 FINDINGS: Technical note: Examination degraded by respiratory motion artifact. Cardiovascular: Mild cardiomegaly. No pericardial effusion. Thoracic aorta nonaneurysmal. Minimal aortic atherosclerotic calcification. Prominent coronary artery calcifications. Mediastinum/Nodes: No axillary lymphadenopathy. Scattered mediastinal lymph nodes. No mediastinal lymphadenopathy by size criteria. Evaluation of the hilar structures is limited in the absence of intravenous contrast. Unremarkable thyroid, trachea, and esophagus. Lungs/Pleura: Trace left pleural effusion. No focal airspace consolidation. No pneumothorax. No evidence of edema. Upper Abdomen: Nodularity of the hepatic contour compatible with history of cirrhosis. Visualized portion of the spleen appears enlarged. Small amount of ascites within the  upper abdomen. Musculoskeletal: Degenerative changes within the thoracic spine. Exaggerated thoracic kyphosis. No acute osseous finding. Mild bilateral gynecomastia IMPRESSION: 1. Trace left pleural effusion. No focal airspace consolidation. 2. Nodularity of the hepatic contour compatible with history of cirrhosis. Splenomegaly. Small amount of ascites within the upper abdomen. 3. Coronary atherosclerosis. Electronically Signed   By: Davina Poke D.O.   On: 12/14/2019 11:28   NM Pulmonary Perfusion  Result Date: 12/14/2019 CLINICAL DATA:  Shortness of breath EXAM: NUCLEAR MEDICINE PERFUSION LUNG SCAN TECHNIQUE: Perfusion images were obtained in multiple projections after intravenous injection of radiopharmaceutical. Ventilation scans intentionally deferred if perfusion scan and chest x-ray adequate for interpretation during COVID 19 epidemic. RADIOPHARMACEUTICALS:  4.47 mCi Tc-42m MAA IV COMPARISON:  Chest x-ray today FINDINGS: No segmental or subsegmental perfusion defects to suggest pulmonary embolus. IMPRESSION: No evidence of pulmonary embolus. Electronically Signed   By: Rolm Baptise M.D.   On: 12/14/2019 15:07   US Venous Img Lower Unilateral Left (DVT)  Result Date: 11/27/2019 CLINICAL DATA:  Left leg swelling.  Concern for DVT. EXAM: LEFT LOWER EXTREMITY VENOUS DOPPLER ULTRASOUND TECHNIQUE: Gray-scale sonography with graded compression, as well as color Doppler and duplex ultrasound were performed to  evaluate the lower extremity deep venous systems from the level of the common femoral vein and including the common femoral, femoral, profunda femoral, popliteal and calf veins including the posterior tibial, peroneal and gastrocnemius veins when visible. The superficial great saphenous vein was also interrogated. Spectral Doppler was utilized to evaluate flow at rest and with distal augmentation maneuvers in the common femoral, femoral and popliteal veins. COMPARISON:  None. FINDINGS: Contralateral  Common Femoral Vein: Respiratory phasicity is normal and symmetric with the symptomatic side. No evidence of thrombus. Normal compressibility. Common Femoral Vein: No evidence of thrombus. Normal compressibility, respiratory phasicity and response to augmentation. Saphenofemoral Junction: No evidence of thrombus. Normal compressibility and flow on color Doppler imaging. Profunda Femoral Vein: No evidence of thrombus. Normal compressibility and flow on color Doppler imaging. Femoral Vein: No evidence of thrombus. Normal compressibility, respiratory phasicity and response to augmentation. Popliteal Vein: No evidence of thrombus. Normal compressibility, respiratory phasicity and response to augmentation. Calf Veins: The posterior tibial vein is unremarkable. The peroneal vein was not visualized. Superficial Great Saphenous Vein: No evidence of thrombus. Normal compressibility. Venous Reflux:  Not evaluated. Other Findings:  Left lower extremity edema is noted. IMPRESSION: 1. No DVT identified. 2. There is nonspecific lower extremity edema. Correlation with physical exam is recommended. Electronically Signed   By: Constance Holster M.D.   On: 11/27/2019 17:00   DG Chest Portable 1 View  Result Date: 12/14/2019 CLINICAL DATA:  Cough and shortness of breath EXAM: PORTABLE CHEST 1 VIEW COMPARISON:  April 28, 2019 FINDINGS: There is no edema or airspace opacity. There is cardiomegaly with central pulmonary vascular congestion and mild pulmonary venous hypertension. No adenopathy. No bone lesions. IMPRESSION: Stable cardiomegaly with a degree of pulmonary vascular congestion. No frank edema or consolidation. No adenopathy. Electronically Signed   By: Lowella Grip III M.D.   On: 12/14/2019 09:32   ECHOCARDIOGRAM COMPLETE  Result Date: 12/17/2019    ECHOCARDIOGRAM REPORT   Patient Name:   [name] Date of Exam: 12/16/2019 Medical Rec #:  [id number]    Height:       72.0 in Accession #:    [id number]   Weight:        325.2 lb Date of Birth:  [birth year]     BSA:          2.619 m Patient Age:    16 years     BP:           135/50 mmHg Patient Gender: M            HR:           79 bpm. Exam Location:  [facility] Procedure: 2D Echo, Cardiac Doppler, Color Doppler and Intracardiac            Opacification Agent Indications:     R94.31 Abnormal EKG  History:         Patient has prior history of Echocardiogram examinations, most                  recent 10/16/2018. Risk Factors:Hypertension, Dyslipidemia,                  Diabetes and Morbid Obesity. Chronic kidney disease. Sleep                  apnea. Edema. Cirrhosis.  Sonographer:     Wilford Sports Rodgers-Jones Referring Phys:  [id number] [place] Diagnosing Phys: Bartholome Bill MD IMPRESSIONS  1. Left ventricular ejection fraction, by  estimation, is 60 to 65%. The left ventricle has normal function. The left ventricle has no regional wall motion abnormalities. There is moderate left ventricular hypertrophy. Left ventricular diastolic parameters are consistent with Grade I diastolic dysfunction (impaired relaxation).  2. Right ventricular systolic function is normal. The right ventricular size is mildly enlarged.  3. Left atrial size was mildly dilated.  4. The mitral valve was not well visualized. Trivial mitral valve regurgitation.  5. The aortic valve was not well visualized. Aortic valve regurgitation is trivial. Mild to moderate aortic valve sclerosis/calcification is present, without any evidence of aortic stenosis. FINDINGS  Left Ventricle: Left ventricular ejection fraction, by estimation, is 60 to 65%. The left ventricle has normal function. The left ventricle has no regional wall motion abnormalities. Definity contrast agent was given IV to delineate the left ventricular  endocardial borders. The left ventricular internal cavity size was normal in size. There is moderate left ventricular hypertrophy. Left ventricular diastolic parameters are consistent with Grade I diastolic  dysfunction (impaired relaxation). Right Ventricle: The right ventricular size is mildly enlarged. No increase in right ventricular wall thickness. Right ventricular systolic function is normal. Left Atrium: Left atrial size was mildly dilated. Right Atrium: Right atrial size was normal in size. Pericardium: There is no evidence of pericardial effusion. Mitral Valve: The mitral valve was not well visualized. Trivial mitral valve regurgitation. Tricuspid Valve: The tricuspid valve is not well visualized. Tricuspid valve regurgitation is mild. Aortic Valve: The aortic valve was not well visualized. Aortic valve regurgitation is trivial. Mild to moderate aortic valve sclerosis/calcification is present, without any evidence of aortic stenosis. Aortic valve mean gradient measures 10.5 mmHg. Aortic valve peak gradient measures 18.1 mmHg. Aortic valve area, by VTI measures 2.78 cm. Pulmonic Valve: The pulmonic valve was not well visualized. Pulmonic valve regurgitation is not visualized. Aorta: The aortic root was not well visualized. IAS/Shunts: The interatrial septum was not assessed.  LEFT VENTRICLE PLAX 2D LVIDd:         5.37 cm  Diastology LVIDs:         3.60 cm  LV e' lateral:   7.40 cm/s LV PW:         1.13 cm  LV E/e' lateral: 20.4 LV IVS:        1.16 cm  LV e' medial:    8.81 cm/s LVOT diam:     2.20 cm  LV E/e' medial:  17.1 LV SV:         122 LV SV Index:   47 LVOT Area:     3.80 cm  RIGHT VENTRICLE RV Basal diam:  5.28 cm RV S prime:     15.55 cm/s TAPSE (M-mode): 2.4 cm LEFT ATRIUM             Index       RIGHT ATRIUM           Index LA diam:        5.10 cm 1.95 cm/m  RA Area:     15.30 cm LA Vol (A2C):   94.7 ml 36.15 ml/m RA Volume:   39.20 ml  14.97 ml/m LA Vol (A4C):   72.8 ml 27.79 ml/m LA Biplane Vol: 86.2 ml 32.91 ml/m  AORTIC VALVE AV Area (Vmax):    2.50 cm AV Area (Vmean):   2.61 cm AV Area (VTI):     2.78 cm AV Vmax:           212.50 cm/s AV Vmean:  151.500 cm/s AV VTI:             0.440 m AV Peak Grad:      18.1 mmHg AV Mean Grad:      10.5 mmHg LVOT Vmax:         140.00 cm/s LVOT Vmean:        104.000 cm/s LVOT VTI:          0.322 m LVOT/AV VTI ratio: 0.73  AORTA Ao Root diam: 3.40 cm MITRAL VALVE MV Area (PHT): 2.91 cm     SHUNTS MV Decel Time: 261 msec     Systemic VTI:  0.32 m MV E velocity: 151.00 cm/s  Systemic Diam: 2.20 cm MV A velocity: 127.00 cm/s MV E/A ratio:  1.19 Bartholome Bill MD Electronically signed by Bartholome Bill MD Signature Date/Time: 12/17/2019/8:07:35 AM    Final        Subjective: Patient denies shortness of breath, chest pain, or any other complaints.  Afebrile.  Discharge Exam: Vitals:   12/17/19 0342 12/17/19 0812  BP: (!) 145/58 (!) 146/60  Pulse: 70 69  Resp: 18 18  Temp: 98.3 F (36.8 C) 98 F (36.7 C)  SpO2: 100% 96%   Vitals:   12/16/19 1908 12/16/19 2340 12/17/19 0342 12/17/19 0812  BP: (!) 135/50 (!) 161/56 (!) 145/58 (!) 146/60  Pulse: 75 71 70 69  Resp:  16 18 18   Temp:  97.9 F (36.6 C) 98.3 F (36.8 C) 98 F (36.7 C)  TempSrc:   Oral   SpO2:  98% 100% 96%  Weight:      Height:        General: Pt is alert, awake, not in acute distress Cardiovascular: RRR, S1/S2 +, no rubs, no gallops Respiratory: CTA bilaterally, no wheezing, no rhonchi Abdominal: Soft, NT, ND, bowel sounds + Extremities: + b/l  edema    The results of significant diagnostics from this hospitalization (including imaging, microbiology, ancillary and laboratory) are listed below for reference.     Microbiology: Recent Results (from the past 240 hour(s))  Blood culture (single)     Status: Abnormal   Collection Time: 12/14/19  9:05 AM   Specimen: BLOOD  Result Value Ref Range Status   Specimen Description   Final    BLOOD RIGHT FA Performed at [facility], [address], [place], [place] [address]    Special Requests   Final    BOTTLES DRAWN AEROBIC AND ANAEROBIC Blood Culture adequate volume Performed at [facility], [address] [place], [place] [address]    Culture  Setup Time   Final    AEROBIC BOTTLE ONLY GRAM NEGATIVE RODS CRITICAL RESULT CALLED TO, READ BACK BY AND VERIFIED WITH: SCOTT HALL PHARMD 2344 12/14/19 HNM    Culture KLEBSIELLA OXYTOCA (A)  Final   Report Status 12/17/2019 FINAL  Final   Organism ID, Bacteria KLEBSIELLA OXYTOCA  Final      Susceptibility   Klebsiella oxytoca - MIC*    AMPICILLIN >=32 RESISTANT Resistant     CEFAZOLIN <=4 SENSITIVE Sensitive     CEFEPIME <=0.12 SENSITIVE Sensitive     CEFTAZIDIME <=1 SENSITIVE Sensitive     CEFTRIAXONE <=0.25 SENSITIVE Sensitive     CIPROFLOXACIN 0.5 SENSITIVE Sensitive     GENTAMICIN <=1 SENSITIVE Sensitive     IMIPENEM <=0.25 SENSITIVE Sensitive     TRIMETH/SULFA >=320 RESISTANT Resistant     AMPICILLIN/SULBACTAM 8 SENSITIVE Sensitive     PIP/TAZO <=4 SENSITIVE Sensitive     *  KLEBSIELLA OXYTOCA  Blood Culture ID Panel (Reflexed)     Status: Abnormal   Collection Time: 12/14/19  9:05 AM  Result Value Ref Range Status   Enterococcus species NOT DETECTED NOT DETECTED Final   Listeria monocytogenes NOT DETECTED NOT DETECTED Final   Staphylococcus species NOT DETECTED NOT DETECTED Final   Staphylococcus aureus (BCID) NOT DETECTED NOT DETECTED Final   Streptococcus species NOT DETECTED NOT DETECTED Final   Streptococcus agalactiae NOT DETECTED NOT DETECTED Final   Streptococcus pneumoniae NOT DETECTED NOT DETECTED Final   Streptococcus pyogenes NOT DETECTED NOT DETECTED Final   Acinetobacter baumannii NOT DETECTED NOT DETECTED Final   Enterobacteriaceae species DETECTED (A) NOT DETECTED Final    Comment: Enterobacteriaceae represent a large family of gram-negative bacteria, not a single organism. CRITICAL RESULT CALLED TO, READ BACK BY AND VERIFIED WITH: SCOTT HALL PHARMD 2344 12/14/19 HNM    Enterobacter cloacae complex NOT DETECTED NOT DETECTED Final   Escherichia coli NOT DETECTED NOT DETECTED Final   Klebsiella  oxytoca DETECTED (A) NOT DETECTED Final    Comment: CRITICAL RESULT CALLED TO, READ BACK BY AND VERIFIED WITH: SCOTT HALL PHARMD 2344 12/14/19 HNM    Klebsiella pneumoniae NOT DETECTED NOT DETECTED Final   Proteus species NOT DETECTED NOT DETECTED Final   Serratia marcescens NOT DETECTED NOT DETECTED Final   Carbapenem resistance NOT DETECTED NOT DETECTED Final   Haemophilus influenzae NOT DETECTED NOT DETECTED Final   Neisseria meningitidis NOT DETECTED NOT DETECTED Final   Pseudomonas aeruginosa NOT DETECTED NOT DETECTED Final   Candida albicans NOT DETECTED NOT DETECTED Final   Candida glabrata NOT DETECTED NOT DETECTED Final   Candida krusei NOT DETECTED NOT DETECTED Final   Candida parapsilosis NOT DETECTED NOT DETECTED Final   Candida tropicalis NOT DETECTED NOT DETECTED Final    Comment: Performed at [facility], [place], [place] [address]  SARS CORONAVIRUS 2 (TAT 6-24 HRS) Nasopharyngeal Nasopharyngeal Swab     Status: None   Collection Time: 12/14/19  9:29 AM   Specimen: Nasopharyngeal Swab  Result Value Ref Range Status   SARS Coronavirus 2 NEGATIVE NEGATIVE Final    Comment: (NOTE) SARS-CoV-2 target nucleic acids are NOT DETECTED. The SARS-CoV-2 RNA is generally detectable in upper and lower respiratory specimens during the acute phase of infection. Negative results do not preclude SARS-CoV-2 infection, do not rule out co-infections with other pathogens, and should not be used as the sole basis for treatment or other patient management decisions. Negative results must be combined with clinical observations, patient history, and epidemiological information. The expected result is Negative. Fact Sheet for Patients: SugarRoll.be Fact Sheet for Healthcare Providers: https://www.woods-mathews.com/ This test is not yet approved or cleared by the [place] FDA and  has been authorized for detection and/or  diagnosis of SARS-CoV-2 by FDA under an Emergency Use Authorization (EUA). This EUA will remain  in effect (meaning this test can be used) for the duration of the COVID-19 declaration under Section 56 4(b)(1) of the Act, 21 U.S.C. section 360bbb-3(b)(1), unless the authorization is terminated or revoked sooner. Performed at [facility], [address], [place],  [address]      Labs: BNP (last 3 results) Recent Labs    11/27/19 1534 12/14/19 0908 12/17/19 0910  BNP 266.0* 131.0* 220.2*   Basic Metabolic Panel: Recent Labs  Lab 12/14/19 0908 12/15/19 0521 12/16/19 0536  NA 141 142 143  K 4.1 4.1 3.5  CL 108 111 112*  CO2 21* 22 25  GLUCOSE 111* 183* 101*  BUN 41* 45* 48*  CREATININE 2.77* 2.68* 2.52*  CALCIUM 8.5* 8.3* 8.4*   Liver Function Tests: Recent Labs  Lab 12/14/19 0908 12/16/19 0536  AST 29 31  ALT 19 19  ALKPHOS 80 62  BILITOT 0.8 0.7  PROT 6.9 6.1*  ALBUMIN 2.7* 2.3*   No results for input(s): LIPASE, AMYLASE in the last 168 hours. Recent Labs  Lab 12/14/19 0908  AMMONIA 30   CBC: Recent Labs  Lab 12/14/19 0908 12/15/19 0521 12/16/19 0536  WBC 11.7* 11.2* 8.0  NEUTROABS 10.6*  --   --   HGB 8.2* 7.5* 7.5*  HCT 26.6* 24.0* 24.0*  MCV 97.8 97.6 96.8  PLT 172 109* 120*   Cardiac Enzymes: No results for input(s): CKTOTAL, CKMB, CKMBINDEX, TROPONINI in the last 168 hours. BNP: Invalid input(s): POCBNP CBG: Recent Labs  Lab 12/16/19 0759 12/16/19 1152 12/16/19 1701 12/16/19 2245 12/17/19 0432  GLUCAP 94 105* 140* 142* 98   D-Dimer No results for input(s): DDIMER in the last 72 hours. Hgb A1c Recent Labs    12/15/19 0521  HGBA1C 6.6*   Lipid Profile Recent Labs    12/15/19 0521  CHOL 141  HDL 37*  LDLCALC 93  TRIG 56  CHOLHDL 3.8   Thyroid function studies No results for input(s): TSH, T4TOTAL, T3FREE, THYROIDAB in the last 72 hours.  Invalid input(s): FREET3 Anemia work up No results for input(s):  VITAMINB12, FOLATE, FERRITIN, TIBC, IRON, RETICCTPCT in the last 72 hours. Urinalysis    Component Value Date/Time   COLORURINE YELLOW (A) 03/14/2019 2210   APPEARANCEUR HAZY (A) 03/14/2019 2210   APPEARANCEUR Clear 09/10/2014 0456   LABSPEC 1.016 03/14/2019 2210   LABSPEC 1.016 09/10/2014 0456   PHURINE 5.0 03/14/2019 2210   GLUCOSEU NEGATIVE 03/14/2019 2210   GLUCOSEU Negative 09/10/2014 0456   HGBUR SMALL (A) 03/14/2019 2210   BILIRUBINUR NEGATIVE 03/14/2019 2210   BILIRUBINUR Negative 09/10/2014 0456   KETONESUR NEGATIVE 03/14/2019 2210   PROTEINUR 100 (A) 03/14/2019 2210   NITRITE NEGATIVE 03/14/2019 2210   LEUKOCYTESUR NEGATIVE 03/14/2019 2210   LEUKOCYTESUR Negative 09/10/2014 0456   Sepsis Labs Invalid input(s): PROCALCITONIN,  WBC,  LACTICIDVEN Microbiology Recent Results (from the past 240 hour(s))  Blood culture (single)     Status: Abnormal   Collection Time: 12/14/19  9:05 AM   Specimen: BLOOD  Result Value Ref Range Status   Specimen Description   Final    BLOOD RIGHT FA Performed at [facility], [address], [place], [place] [address]    Special Requests   Final    BOTTLES DRAWN AEROBIC AND ANAEROBIC Blood Culture adequate volume Performed at [facility], [address] [place], [place] [address]    Culture  Setup Time   Final    AEROBIC BOTTLE ONLY GRAM NEGATIVE RODS CRITICAL RESULT CALLED TO, READ BACK BY AND VERIFIED WITH: SCOTT HALL PHARMD 2344 12/14/19 HNM    Culture KLEBSIELLA OXYTOCA (A)  Final   Report Status 12/17/2019 FINAL  Final   Organism ID, Bacteria KLEBSIELLA OXYTOCA  Final      Susceptibility   Klebsiella oxytoca - MIC*    AMPICILLIN >=32 RESISTANT Resistant     CEFAZOLIN <=4 SENSITIVE Sensitive     CEFEPIME <=0.12 SENSITIVE Sensitive     CEFTAZIDIME <=1 SENSITIVE Sensitive     CEFTRIAXONE <=0.25 SENSITIVE Sensitive     CIPROFLOXACIN 0.5 SENSITIVE Sensitive  GENTAMICIN <=1 SENSITIVE Sensitive      IMIPENEM <=0.25 SENSITIVE Sensitive     TRIMETH/SULFA >=320 RESISTANT Resistant     AMPICILLIN/SULBACTAM 8 SENSITIVE Sensitive     PIP/TAZO <=4 SENSITIVE Sensitive     * KLEBSIELLA OXYTOCA  Blood Culture ID Panel (Reflexed)     Status: Abnormal   Collection Time: 12/14/19  9:05 AM  Result Value Ref Range Status   Enterococcus species NOT DETECTED NOT DETECTED Final   Listeria monocytogenes NOT DETECTED NOT DETECTED Final   Staphylococcus species NOT DETECTED NOT DETECTED Final   Staphylococcus aureus (BCID) NOT DETECTED NOT DETECTED Final   Streptococcus species NOT DETECTED NOT DETECTED Final   Streptococcus agalactiae NOT DETECTED NOT DETECTED Final   Streptococcus pneumoniae NOT DETECTED NOT DETECTED Final   Streptococcus pyogenes NOT DETECTED NOT DETECTED Final   Acinetobacter baumannii NOT DETECTED NOT DETECTED Final   Enterobacteriaceae species DETECTED (A) NOT DETECTED Final    Comment: Enterobacteriaceae represent a large family of gram-negative bacteria, not a single organism. CRITICAL RESULT CALLED TO, READ BACK BY AND VERIFIED WITH: SCOTT HALL PHARMD 2344 12/14/19 HNM    Enterobacter cloacae complex NOT DETECTED NOT DETECTED Final   Escherichia coli NOT DETECTED NOT DETECTED Final   Klebsiella oxytoca DETECTED (A) NOT DETECTED Final    Comment: CRITICAL RESULT CALLED TO, READ BACK BY AND VERIFIED WITH: SCOTT HALL PHARMD [id number] 12/14/19 HNM    Klebsiella pneumoniae NOT DETECTED NOT DETECTED Final   Proteus species NOT DETECTED NOT DETECTED Final   Serratia marcescens NOT DETECTED NOT DETECTED Final   Carbapenem resistance NOT DETECTED NOT DETECTED Final   Haemophilus influenzae NOT DETECTED NOT DETECTED Final   Neisseria meningitidis NOT DETECTED NOT DETECTED Final   Pseudomonas aeruginosa NOT DETECTED NOT DETECTED Final   Candida albicans NOT DETECTED NOT DETECTED Final   Candida glabrata NOT DETECTED NOT DETECTED Final   Candida krusei NOT DETECTED NOT DETECTED Final    Candida parapsilosis NOT DETECTED NOT DETECTED Final   Candida tropicalis NOT DETECTED NOT DETECTED Final    Comment: Performed at [facility], [place], [place] [address]  SARS CORONAVIRUS 2 (TAT 6-24 HRS) Nasopharyngeal Nasopharyngeal Swab     Status: None   Collection Time: 12/14/19  9:29 AM   Specimen: Nasopharyngeal Swab  Result Value Ref Range Status   SARS Coronavirus 2 NEGATIVE NEGATIVE Final    Comment: (NOTE) SARS-CoV-2 target nucleic acids are NOT DETECTED. The SARS-CoV-2 RNA is generally detectable in upper and lower respiratory specimens during the acute phase of infection. Negative results do not preclude SARS-CoV-2 infection, do not rule out co-infections with other pathogens, and should not be used as the sole basis for treatment or other patient management decisions. Negative results must be combined with clinical observations, patient history, and epidemiological information. The expected result is Negative. Fact Sheet for Patients: SugarRoll.be Fact Sheet for Healthcare Providers: https://www.woods-mathews.com/ This test is not yet approved or cleared by the [place] FDA and  has been authorized for detection and/or diagnosis of SARS-CoV-2 by FDA under an Emergency Use Authorization (EUA). This EUA will remain  in effect (meaning this test can be used) for the duration of the COVID-19 declaration under Section 56 4(b)(1) of the Act, 21 U.S.C. section 360bbb-3(b)(1), unless the authorization is terminated or revoked sooner. Performed at [facility], [place] [address], [place], [place] [address]      Time coordinating discharge: Over 30 minutes  SIGNED:  Nolberto Hanlon, MD  [facility] 12/17/2019, 2:58 PM Pager   If 7PM-7AM, please contact night-coverage [email or web address] Password [email or web address]

## 2019-12-17 NOTE — Consult Note (Signed)
CARDIOLOGY CONSULT NOTE               Patient ID: Corey West MRN: [id number] DOB/AGE: 02-26-1951 69 y.o.  Admit date: 12/14/2019 Referring Physician Dr. Nolberto Hanlon  Primary Physician Dr. [name]  Primary Cardiologist N/A Reason for Consultation Elevated troponin  HPI: Corey West is a 69 year old male with a past medical history significant for chronic HFpEF, type 2 diabetes, chronic kidney disease, cirrhosis, chronic venous insufficiency, and morbid obesity who presented to the ED on 12/14/19 for worsening lower extremity swelling, cellulitis, and shortness of breath.  Workup in the ED was significant for a creatinine of 2.77, GFR of 23, high sensitivity troponin elevated x 5 at 22, 191, 539, 622, and 147 respectively, BNP of 131 (previous admission 266), lactic acid of 2.3, and COVID-19 negative.  Initial ECG revealed sinus tachycardia, rate of 109bpm with repeat ECG revealing sinus rhythm, 1st degree AV block, at a ventricular rate of 74bpm. Chest xray revealed mild pulmonary vascular congestion and chest CT revealed evidence of upper abdominal ascites.  Imaging negative for DVT/PE.   Today Corey West reports complete resolution of shortness of breath.  He denies current or previous episodes of chest pain or chest pressure.  He denies palpitations or heart racing.  Lower extremity swelling has also improved and he denies orthopnea or PND.  He denies syncopal/presyncopal episodes.   Echocardiogram on 10/16/18 revealed normal LV systolic function with an EF estimated between 55-60% with grade 2 diastolic dysfunction, mild LVH.   Review of systems complete and found to be negative unless listed above     Past Medical History:  Diagnosis Date  . Anemia   . Anxiety    controlled;   . Arthritis   . AVM (arteriovenous malformation) of stomach, acquired with hemorrhage   . Barrett's esophagus   . Chronic kidney disease    renal infufficiency  . Cirrhosis ([place])   . Depression    controlled;   Marland Kitchen Diabetes mellitus without complication ([place])    not controlled, taking insulin but sugar continues to run high;   . Edema   . Esophageal varices ([place])   . GAVE (gastric antral vascular ectasia)   . GERD (gastroesophageal reflux disease)   . History of hiatal hernia   . Hyperlipidemia   . Hypertension    controlled well;   . Liver cirrhosis ([place])   . Nephropathy, diabetic ([place])   . Obesity   . Pancytopenia ([place])   . Polyp, stomach    with chronic blood loss  . Sleep apnea    does not wear a cpap, Medicare would not pay for it   . Venous stasis dermatitis of both lower extremities   . Venous stasis of both lower extremities    with cellulitis    Past Surgical History:  Procedure Laterality Date  . ESOPHAGOGASTRODUODENOSCOPY N/A 02/09/2015   Procedure: ESOPHAGOGASTRODUODENOSCOPY (EGD);  Surgeon: Manya Silvas, MD;  Location: [facility] ENDOSCOPY;  Service: Endoscopy;  Laterality: N/A;  . ESOPHAGOGASTRODUODENOSCOPY N/A 07/22/2015   Procedure: ESOPHAGOGASTRODUODENOSCOPY (EGD);  Surgeon: Manya Silvas, MD;  Location: [facility] ENDOSCOPY;  Service: Endoscopy;  Laterality: N/A;  . ESOPHAGOGASTRODUODENOSCOPY N/A 06/26/2017   Procedure: ESOPHAGOGASTRODUODENOSCOPY (EGD);  Surgeon: Manya Silvas, MD;  Location: [facility] ENDOSCOPY;  Service: Endoscopy;  Laterality: N/A;  . ESOPHAGOGASTRODUODENOSCOPY N/A 06/27/2017   Procedure: ESOPHAGOGASTRODUODENOSCOPY (EGD);  Surgeon: Manya Silvas, MD;  Location: [facility] ENDOSCOPY;  Service: Endoscopy;  Laterality: N/A;  . ESOPHAGOGASTRODUODENOSCOPY N/A 06/28/2017  Procedure: ESOPHAGOGASTRODUODENOSCOPY (EGD);  Surgeon: Manya Silvas, MD;  Location: [facility] ENDOSCOPY;  Service: Endoscopy;  Laterality: N/A;  . ESOPHAGOGASTRODUODENOSCOPY Bilateral 10/11/2017   Procedure: ESOPHAGOGASTRODUODENOSCOPY (EGD);  Surgeon: Manya Silvas, MD;  Location: [facility] ENDOSCOPY;  Service: Endoscopy;  Laterality: Bilateral;  . ESOPHAGOGASTRODUODENOSCOPY N/A 12/04/2017     Procedure: ESOPHAGOGASTRODUODENOSCOPY (EGD);  Surgeon: Manya Silvas, MD;  Location: [facility] ENDOSCOPY;  Service: Endoscopy;  Laterality: N/A;  . ESOPHAGOGASTRODUODENOSCOPY N/A 03/06/2019   Procedure: ESOPHAGOGASTRODUODENOSCOPY (EGD);  Surgeon: Tol[place], Benay Pike, MD;  Location: [facility] ENDOSCOPY;  Service: Gastroenterology;  Laterality: N/A;  . ESOPHAGOGASTRODUODENOSCOPY (EGD) WITH PROPOFOL N/A 07/20/2015   Procedure: ESOPHAGOGASTRODUODENOSCOPY (EGD) WITH PROPOFOL;  Surgeon: Manya Silvas, MD;  Location: [facility] ENDOSCOPY;  Service: Endoscopy;  Laterality: N/A;  . ESOPHAGOGASTRODUODENOSCOPY (EGD) WITH PROPOFOL N/A 09/16/2015   Procedure: ESOPHAGOGASTRODUODENOSCOPY (EGD) WITH PROPOFOL;  Surgeon: Manya Silvas, MD;  Location: [facility] ENDOSCOPY;  Service: Endoscopy;  Laterality: N/A;  . ESOPHAGOGASTRODUODENOSCOPY (EGD) WITH PROPOFOL N/A 03/16/2016   Procedure: ESOPHAGOGASTRODUODENOSCOPY (EGD) WITH PROPOFOL;  Surgeon: Manya Silvas, MD;  Location: [facility] ENDOSCOPY;  Service: Endoscopy;  Laterality: N/A;  . ESOPHAGOGASTRODUODENOSCOPY (EGD) WITH PROPOFOL N/A 09/14/2016   Procedure: ESOPHAGOGASTRODUODENOSCOPY (EGD) WITH PROPOFOL;  Surgeon: Manya Silvas, MD;  Location: [facility] ENDOSCOPY;  Service: Endoscopy;  Laterality: N/A;  . ESOPHAGOGASTRODUODENOSCOPY (EGD) WITH PROPOFOL N/A 11/05/2016   Procedure: ESOPHAGOGASTRODUODENOSCOPY (EGD) WITH PROPOFOL;  Surgeon: Manya Silvas, MD;  Location: [facility] ENDOSCOPY;  Service: Endoscopy;  Laterality: N/A;  . ESOPHAGOGASTRODUODENOSCOPY (EGD) WITH PROPOFOL N/A 02/06/2017   Procedure: ESOPHAGOGASTRODUODENOSCOPY (EGD) WITH PROPOFOL;  Surgeon: Manya Silvas, MD;  Location: [facility] ENDOSCOPY;  Service: Endoscopy;  Laterality: N/A;  . ESOPHAGOGASTRODUODENOSCOPY (EGD) WITH PROPOFOL N/A 04/24/2017   Procedure: ESOPHAGOGASTRODUODENOSCOPY (EGD) WITH PROPOFOL;  Surgeon: Manya Silvas, MD;  Location: [facility] ENDOSCOPY;  Service: Endoscopy;  Laterality: N/A;  .  ESOPHAGOGASTRODUODENOSCOPY (EGD) WITH PROPOFOL N/A 07/31/2017   Procedure: ESOPHAGOGASTRODUODENOSCOPY (EGD) WITH PROPOFOL;  Surgeon: Manya Silvas, MD;  Location: [facility] ENDOSCOPY;  Service: Endoscopy;  Laterality: N/A;  . ESOPHAGOGASTRODUODENOSCOPY (EGD) WITH PROPOFOL N/A 08/08/2018   Procedure: ESOPHAGOGASTRODUODENOSCOPY (EGD) WITH PROPOFOL;  Surgeon: Manya Silvas, MD;  Location: [facility] ENDOSCOPY;  Service: Endoscopy;  Laterality: N/A;  . GIVENS CAPSULE STUDY N/A 12/05/2017   Procedure: GIVENS CAPSULE STUDY;  Surgeon: Manya Silvas, MD;  Location: [facility] ENDOSCOPY;  Service: Endoscopy;  Laterality: N/A;  . TONSILLECTOMY    . TONSILLECTOMY AND ADENOIDECTOMY    . ULNAR NERVE TRANSPOSITION    . UVULOPALATOPHARYNGOPLASTY      Medications Prior to Admission  Medication Sig Dispense Refill Last Dose  . cetirizine (ZYRTEC) 10 MG tablet Take 10 mg by mouth daily.   12/13/2019 at Unknown time  . ferrous sulfate 325 (65 FE) MG tablet Take 325 mg by mouth 2 (two) times daily with a meal.    12/13/2019 at Unknown time  . furosemide (LASIX) 40 MG tablet Take 80 mg by mouth 2 (two) times a day.    12/13/2019 at Unknown time  . insulin aspart protamine - aspart (NOVOLOG 70/30 MIX) (70-30) 100 UNIT/ML FlexPen Inject 84 Units into the skin 2 (two) times daily.    12/13/2019 at Unknown time  . lactulose (CHRONULAC) 10 GM/15ML solution Take 30 g by mouth 4 (four) times daily.    12/13/2019 at Unknown time  . LORazepam (ATIVAN) 0.5 MG tablet Take 1 tablet (0.5 mg total) by mouth every 8 (eight) hours as needed for anxiety. 20 tablet 0 12/13/2019 at Unknown  time  . Multiple Vitamin (MULTIVITAMIN) tablet Take 1 tablet by mouth daily.   12/13/2019 at Unknown time  . pantoprazole (PROTONIX) 40 MG tablet Take 1 tablet (40 mg total) by mouth 2 (two) times daily. 60 tablet 11 12/13/2019 at Unknown time  . Potassium Chloride ER 20 MEQ TBCR Take 20 mEq by mouth 2 (two) times daily. While on lasix' (Patient taking differently:  Take 20 mEq by mouth 2 (two) times daily. ) 30 tablet 1 PR at [facility]  . traMADol (ULTRAM) 50 MG tablet Take 50 mg by mouth every 6 (six) hours as needed for moderate pain.    PRN at PRN   Social History   Socioeconomic History  . Marital status: Single    Spouse name: Not on file  . Number of children: Not on file  . Years of education: Not on file  . Highest education level: Not on file  Occupational History  . Occupation: disabled  Tobacco Use  . Smoking status: Former Smoker    Years: 20.00    Types: Cigars, Cigarettes  . Smokeless tobacco: Never Used  Substance and Sexual Activity  . Alcohol use: No    Comment: stopped 15 years ago  . Drug use: No  . Sexual activity: Not Currently  Other Topics Concern  . Not on file  Social History Narrative   Lives alone, neighbor helps occasionally    Social Determinants of Health   Financial Resource Strain:   . Difficulty of Paying Living Expenses:   Food Insecurity:   . Worried About Charity fundraiser in the Last Year:   . Arboriculturist in the Last Year:   Transportation Needs:   . Film/video editor (Medical):   Marland Kitchen Lack of Transportation (Non-Medical):   Physical Activity:   . Days of Exercise per Week:   . Minutes of Exercise per Session:   Stress:   . Feeling of Stress :   Social Connections:   . Frequency of Communication with Friends and Family:   . Frequency of Social Gatherings with Friends and Family:   . Attends Religious Services:   . Active Member of Clubs or Organizations:   . Attends Archivist Meetings:   Marland Kitchen Marital Status:   Intimate Partner Violence:   . Fear of Current or Ex-Partner:   . Emotionally Abused:   Marland Kitchen Physically Abused:   . Sexually Abused:     Family History  Problem Relation Age of Onset  . Diabetes Other   . Transient ischemic attack Father   . CAD Father       Review of systems complete and found to be negative unless listed above      PHYSICAL EXAM  General:  Well developed, morbidly obese, in no acute distress HEENT:  Normocephalic and atramatic Neck:  No JVD.  Lungs: Clear bilaterally to auscultation and percussion. Heart: HRRR . Normal S1 and S2 without gallops or murmurs.  Abdomen: Bowel sounds are positive, abdomen soft and non-tender  Msk:  Back normal.  Normal strength and tone for age. Extremities: 2+ pitting edema in bilateral lower extremities  Neuro: Alert and oriented X 3. Psych:  Good affect, responds appropriately  Labs:   Lab Results  Component Value Date   WBC 8.0 12/16/2019   HGB 7.5 (L) 12/16/2019   HCT 24.0 (L) 12/16/2019   MCV 96.8 12/16/2019   PLT 120 (L) 12/16/2019    Recent Labs  Lab 12/16/19 0536  NA  143  K 3.5  CL 112*  CO2 25  BUN 48*  CREATININE 2.52*  CALCIUM 8.4*  PROT 6.1*  BILITOT 0.7  ALKPHOS 62  ALT 19  AST 31  GLUCOSE 101*   Lab Results  Component Value Date   CKTOTAL 138 10/15/2018   CKMB 2.3 08/13/2014   TROPONINI <0.03 06/18/2018    Lab Results  Component Value Date   CHOL 141 12/15/2019   CHOL 179 01/15/2019   Lab Results  Component Value Date   HDL 37 (L) 12/15/2019   HDL 41 01/15/2019   Lab Results  Component Value Date   LDLCALC 93 12/15/2019   LDLCALC 124 (H) 01/15/2019   Lab Results  Component Value Date   TRIG 56 12/15/2019   TRIG 72 01/15/2019   Lab Results  Component Value Date   CHOLHDL 3.8 12/15/2019   CHOLHDL 4.4 01/15/2019   No results found for: LDLDIRECT    Radiology: CT Chest Wo Contrast  Result Date: 12/14/2019 CLINICAL DATA:  Cough, shortness of breath EXAM: CT CHEST WITHOUT CONTRAST TECHNIQUE: Multidetector CT imaging of the chest was performed following the standard protocol without IV contrast. COMPARISON:  X-ray 12/14/2019, abdominal MRI 11/11/2019 FINDINGS: Technical note: Examination degraded by respiratory motion artifact. Cardiovascular: Mild cardiomegaly. No pericardial effusion. Thoracic aorta nonaneurysmal. Minimal aortic  atherosclerotic calcification. Prominent coronary artery calcifications. Mediastinum/Nodes: No axillary lymphadenopathy. Scattered mediastinal lymph nodes. No mediastinal lymphadenopathy by size criteria. Evaluation of the hilar structures is limited in the absence of intravenous contrast. Unremarkable thyroid, trachea, and esophagus. Lungs/Pleura: Trace left pleural effusion. No focal airspace consolidation. No pneumothorax. No evidence of edema. Upper Abdomen: Nodularity of the hepatic contour compatible with history of cirrhosis. Visualized portion of the spleen appears enlarged. Small amount of ascites within the upper abdomen. Musculoskeletal: Degenerative changes within the thoracic spine. Exaggerated thoracic kyphosis. No acute osseous finding. Mild bilateral gynecomastia IMPRESSION: 1. Trace left pleural effusion. No focal airspace consolidation. 2. Nodularity of the hepatic contour compatible with history of cirrhosis. Splenomegaly. Small amount of ascites within the upper abdomen. 3. Coronary atherosclerosis. Electronically Signed   By: Davina Poke D.O.   On: 12/14/2019 11:28   NM Pulmonary Perfusion  Result Date: 12/14/2019 CLINICAL DATA:  Shortness of breath EXAM: NUCLEAR MEDICINE PERFUSION LUNG SCAN TECHNIQUE: Perfusion images were obtained in multiple projections after intravenous injection of radiopharmaceutical. Ventilation scans intentionally deferred if perfusion scan and chest x-ray adequate for interpretation during COVID 19 epidemic. RADIOPHARMACEUTICALS:  4.47 mCi Tc-76m MAA IV COMPARISON:  Chest x-ray today FINDINGS: No segmental or subsegmental perfusion defects to suggest pulmonary embolus. IMPRESSION: No evidence of pulmonary embolus. Electronically Signed   By: Rolm Baptise M.D.   On: 12/14/2019 15:07   US Venous Img Lower Unilateral Left (DVT)  Result Date: 11/27/2019 CLINICAL DATA:  Left leg swelling.  Concern for DVT. EXAM: LEFT LOWER EXTREMITY VENOUS DOPPLER ULTRASOUND  TECHNIQUE: Gray-scale sonography with graded compression, as well as color Doppler and duplex ultrasound were performed to evaluate the lower extremity deep venous systems from the level of the common femoral vein and including the common femoral, femoral, profunda femoral, popliteal and calf veins including the posterior tibial, peroneal and gastrocnemius veins when visible. The superficial great saphenous vein was also interrogated. Spectral Doppler was utilized to evaluate flow at rest and with distal augmentation maneuvers in the common femoral, femoral and popliteal veins. COMPARISON:  None. FINDINGS: Contralateral Common Femoral Vein: Respiratory phasicity is normal and symmetric with the symptomatic side. No  evidence of thrombus. Normal compressibility. Common Femoral Vein: No evidence of thrombus. Normal compressibility, respiratory phasicity and response to augmentation. Saphenofemoral Junction: No evidence of thrombus. Normal compressibility and flow on color Doppler imaging. Profunda Femoral Vein: No evidence of thrombus. Normal compressibility and flow on color Doppler imaging. Femoral Vein: No evidence of thrombus. Normal compressibility, respiratory phasicity and response to augmentation. Popliteal Vein: No evidence of thrombus. Normal compressibility, respiratory phasicity and response to augmentation. Calf Veins: The posterior tibial vein is unremarkable. The peroneal vein was not visualized. Superficial Great Saphenous Vein: No evidence of thrombus. Normal compressibility. Venous Reflux:  Not evaluated. Other Findings:  Left lower extremity edema is noted. IMPRESSION: 1. No DVT identified. 2. There is nonspecific lower extremity edema. Correlation with physical exam is recommended. Electronically Signed   By: Constance Holster M.D.   On: 11/27/2019 17:00   DG Chest Portable 1 View  Result Date: 12/14/2019 CLINICAL DATA:  Cough and shortness of breath EXAM: PORTABLE CHEST 1 VIEW COMPARISON:   April 28, 2019 FINDINGS: There is no edema or airspace opacity. There is cardiomegaly with central pulmonary vascular congestion and mild pulmonary venous hypertension. No adenopathy. No bone lesions. IMPRESSION: Stable cardiomegaly with a degree of pulmonary vascular congestion. No frank edema or consolidation. No adenopathy. Electronically Signed   By: Lowella Grip III M.D.   On: 12/14/2019 09:32    EKG: Normal sinus rhythm   ASSESSMENT AND PLAN:  1.  Elevated troponin   -Likely demand ischemia in the setting of acute diastolic CHF, cirrhosis, and acute on chronic kidney injury   -No evidence of ischemic ECG changes, patient with no history of chest pain   -No further ischemic workup indicated from a cardiac standpoint   2.  Acute on chronic HFpEF  -Clinically improved, currently on Lasix 80mg  BID   -Would recommend follow up in an outpatient setting with Dr. Ubaldo Glassing or Doristine Mango, PA-C within 7-10 days of discharge  -Counseled patient on the importance of dietary modifications, specifically limiting sodium intake to less than 2000mg  daily   -Would caution against the use of metolazone due to chronic kidney disease   The history, physical exam findings, and plan of care were all discussed with Dr. Bartholome Bill, and all decision making was made in collaboration  Signed: Avie Arenas PA-C 12/17/2019, 7:45 AM

## 2019-12-17 NOTE — Progress Notes (Signed)
Discharge; reviewed medications. Patient verbalized understanding. IV removed. Patient left with all his belongings (clothes, cell phone/charger, house keys, bag of clothes and personal walker). Staff wheeled patient out and Pt. was transported via car with nephew.

## 2019-12-21 ENCOUNTER — Other Ambulatory Visit: Payer: Self-pay

## 2019-12-21 ENCOUNTER — Encounter: Payer: Medicare Other | Admitting: Physician Assistant

## 2019-12-21 DIAGNOSIS — K7460 Unspecified cirrhosis of liver: Secondary | ICD-10-CM | POA: Diagnosis not present

## 2019-12-21 DIAGNOSIS — I130 Hypertensive heart and chronic kidney disease with heart failure and stage 1 through stage 4 chronic kidney disease, or unspecified chronic kidney disease: Secondary | ICD-10-CM | POA: Diagnosis not present

## 2019-12-21 DIAGNOSIS — I872 Venous insufficiency (chronic) (peripheral): Secondary | ICD-10-CM | POA: Diagnosis not present

## 2019-12-21 DIAGNOSIS — E1136 Type 2 diabetes mellitus with diabetic cataract: Secondary | ICD-10-CM | POA: Diagnosis not present

## 2019-12-21 DIAGNOSIS — Z6841 Body Mass Index (BMI) 40.0 and over, adult: Secondary | ICD-10-CM | POA: Diagnosis not present

## 2019-12-21 DIAGNOSIS — E1165 Type 2 diabetes mellitus with hyperglycemia: Secondary | ICD-10-CM | POA: Diagnosis not present

## 2019-12-21 DIAGNOSIS — F17290 Nicotine dependence, other tobacco product, uncomplicated: Secondary | ICD-10-CM | POA: Diagnosis not present

## 2019-12-21 DIAGNOSIS — L97822 Non-pressure chronic ulcer of other part of left lower leg with fat layer exposed: Secondary | ICD-10-CM | POA: Diagnosis not present

## 2019-12-21 DIAGNOSIS — Z794 Long term (current) use of insulin: Secondary | ICD-10-CM | POA: Diagnosis not present

## 2019-12-21 DIAGNOSIS — L97812 Non-pressure chronic ulcer of other part of right lower leg with fat layer exposed: Secondary | ICD-10-CM | POA: Diagnosis not present

## 2019-12-21 DIAGNOSIS — G4733 Obstructive sleep apnea (adult) (pediatric): Secondary | ICD-10-CM | POA: Diagnosis not present

## 2019-12-21 DIAGNOSIS — N189 Chronic kidney disease, unspecified: Secondary | ICD-10-CM | POA: Diagnosis not present

## 2019-12-21 DIAGNOSIS — I890 Lymphedema, not elsewhere classified: Secondary | ICD-10-CM | POA: Diagnosis not present

## 2019-12-21 DIAGNOSIS — E1122 Type 2 diabetes mellitus with diabetic chronic kidney disease: Secondary | ICD-10-CM | POA: Diagnosis not present

## 2019-12-21 DIAGNOSIS — K2270 Barrett's esophagus without dysplasia: Secondary | ICD-10-CM | POA: Diagnosis not present

## 2019-12-21 DIAGNOSIS — D649 Anemia, unspecified: Secondary | ICD-10-CM | POA: Diagnosis not present

## 2019-12-21 DIAGNOSIS — E11621 Type 2 diabetes mellitus with foot ulcer: Secondary | ICD-10-CM | POA: Diagnosis not present

## 2019-12-21 DIAGNOSIS — I509 Heart failure, unspecified: Secondary | ICD-10-CM | POA: Diagnosis not present

## 2019-12-21 NOTE — Progress Notes (Signed)
RAFAL, ARCHULETA ([id number]) Visit Report for 12/21/2019 Arrival Information Details Patient Name: LORRAINE, TERRIQUEZ. Date of Service: 12/21/2019 1:00 PM Medical Record Number: [id number] Patient Account Number: [id number] Date of Birth/Sex: 03-17-1951 (69 y.o. M) Treating RN: Montey Hora Primary Care Keshaun Dubey: [name] Other Clinician: Referring Cordaro Mukai: [name] Treating Remington Skalsky/Extender: Melburn Hake, HOYT Weeks in Treatment: 4 Visit Information History Since Last Visit Added or deleted any medications: No Patient Arrived: Walker Any new allergies or adverse reactions: No Arrival Time: 13:02 Had a fall or experienced change in No Accompanied By: self activities of daily living that may affect Transfer Assistance: None risk of falls: Patient Identification Verified: Yes Signs or symptoms of abuse/neglect since last visito No Secondary Verification Process Completed: Yes Hospitalized since last visit: No Implantable device outside of the clinic excluding No cellular tissue based products placed in the center since last visit: Has Dressing in Place as Prescribed: Yes Has Compression in Place as Prescribed: Yes Pain Present Now: No Electronic Signature(s) Signed: 12/21/2019 4:17:09 PM By: Montey Hora Entered By: Montey Hora on 12/21/2019 13:03:35 [name] ([id number]) -------------------------------------------------------------------------------- Compression Therapy Details Patient Name: [name] Date of Service: 12/21/2019 1:00 PM Medical Record Number: [id number] Patient Account Number: [id number] Date of Birth/Sex: [birth year] (69 y.o. M) Treating RN: Army Melia Primary Care Verlie Liotta: [name] Other Clinician: Referring Sohil Timko: [name] Treating Fantasia Jinkins/Extender: Melburn Hake, HOYT Weeks in Treatment: 4 Compression Therapy Performed for Wound Assessment: NonWound Condition Lymphedema - Bilateral Leg Performed By: Clinician Army Melia,  RN Compression Type: Rolena Infante Post Procedure Diagnosis Same as Pre-procedure Electronic Signature(s) Signed: 12/21/2019 4:14:01 PM By: Army Melia Entered By: Army Melia on 12/21/2019 13:24:44 [name] ([id number]) -------------------------------------------------------------------------------- Encounter Discharge Information Details Patient Name: TERESO, UNANGST A. Date of Service: 12/21/2019 1:00 PM Medical Record Number: [id number] Patient Account Number: [id number] Date of Birth/Sex: 11-11-1950 (69 y.o. M) Treating RN: Army Melia Primary Care [place] Karl: [name] Other Clinician: Referring Kahle Mcqueen: [name] Treating Christipher Rieger/Extender: Melburn Hake, HOYT Weeks in Treatment: 4 Encounter Discharge Information Items Discharge Condition: Stable Ambulatory Status: Walker Discharge Destination: Home Transportation: Private Auto Accompanied By: self Schedule Follow-up Appointment: Yes Clinical Summary of Care: Electronic Signature(s) Signed: 12/21/2019 4:14:01 PM By: Army Melia Entered By: Army Melia on 12/21/2019 13:27:44 [name] ([id number]) -------------------------------------------------------------------------------- Lower Extremity Assessment Details Patient Name: ROMEL, DUMOND A. Date of Service: 12/21/2019 1:00 PM Medical Record Number: [id number] Patient Account Number: [id number] Date of Birth/Sex: 1951/06/15 (69 y.o. M) Treating RN: Montey Hora Primary Care Rohini Jaroszewski: [name] Other Clinician: Referring Kemisha Bonnette: [name] Treating Senora Lacson/Extender: Melburn Hake, HOYT Weeks in Treatment: 4 Edema Assessment Assessed: [Left: No] [Right: No] Edema: [Left: Yes] [Right: Yes] Calf Left: Right: Point of Measurement: 32 cm From Medial Instep 42 cm 44 cm Ankle Left: Right: Point of Measurement: 12 cm From Medial Instep 29 cm 29.5 cm Vascular Assessment Pulses: Dorsalis Pedis Palpable: [Left:Yes] [Right:Yes] Electronic Signature(s) Signed:  12/21/2019 4:17:09 PM By: Montey Hora Entered By: Montey Hora on 12/21/2019 13:12:49 [name] ([id number]) -------------------------------------------------------------------------------- Multi Wound Chart Details Patient Name: [name] Date of Service: 12/21/2019 1:00 PM Medical Record Number: [id number] Patient Account Number: [id number] Date of Birth/Sex: 29-Feb-1951 (69 y.o. M) Treating RN: Army Melia Primary Care Disaya Walt: [name] Other Clinician: Referring America Sandall: [name] Treating Dorn Hartshorne/Extender: Melburn Hake, HOYT Weeks in Treatment: 4 Vital Signs Height(in): 72 Pulse(bpm): 32 Weight(lbs): 318 Blood Pressure(mmHg): 167/65 Body Mass Index(BMI): 43 Temperature(F): 97.8 Respiratory Rate(breaths/min): 22 Photos: [10:No Photos] [9:No  Photos] [N/A:N/A] Wound Location: [10:Left Lower Leg - Lateral] [9:Left Lower Leg - Medial] [N/A:N/A] Wounding Event: [10:Gradually Appeared] [9:Gradually Appeared] [N/A:N/A] Primary Etiology: [10:Lymphedema] [9:Lymphedema] [N/A:N/A] Comorbid History: [10:Cataracts, Anemia, Sleep Apnea, Hypertension, Cirrhosis , Type II Diabetes] [9:Cataracts, Anemia, Sleep Apnea, Hypertension, Cirrhosis , Type II Diabetes] [N/A:N/A] Date Acquired: [10:12/21/2019] [9:12/21/2019] [N/A:N/A] Weeks of Treatment: [10:0] [9:0] [N/A:N/A] Wound Status: [10:Open] [9:Open] [N/A:N/A] Measurements L x W x D (cm) [10:1x0.8x0.1] [9:1.1x0.6x0.1] [N/A:N/A] Area (cm) : [10:0.628] [9:0.518] [N/A:N/A] Volume (cm) : [10:0.063] [9:0.052] [N/A:N/A] Classification: [10:Full Thickness Without Exposed Support Structures] [9:Full Thickness Without Exposed Support Structures] [N/A:N/A] Exudate Amount: [10:Medium] [9:Medium] [N/A:N/A] Exudate Type: [10:Serous] [9:Serous] [N/A:N/A] Exudate Color: [10:amber] [9:amber] [N/A:N/A] Wound Margin: [10:Flat and Intact] [9:Flat and Intact] [N/A:N/A] Granulation Amount: [10:Medium (34-66%)] [9:Medium (34-66%)]  [N/A:N/A] Granulation Quality: [10:Pink] [9:Pink] [N/A:N/A] Necrotic Amount: [10:Medium (34-66%)] [9:Medium (34-66%)] [N/A:N/A] Exposed Structures: [10:Fat Layer (Subcutaneous Tissue) Exposed: Yes Fascia: No Tendon: No Muscle: No Joint: No Bone: No None] [9:Fat Layer (Subcutaneous Tissue) Exposed: Yes Fascia: No Tendon: No Muscle: No Joint: No Bone: No Small (1-33%)] [N/A:N/A N/A] Treatment Notes Electronic Signature(s) Signed: 12/21/2019 4:14:01 PM By: Army Melia Entered By: Army Melia on 12/21/2019 13:21:22 [name] ([id number]) -------------------------------------------------------------------------------- [place] Details Patient Name: DEE, MADAY A. Date of Service: 12/21/2019 1:00 PM Medical Record Number: [id number] Patient Account Number: [id number] Date of Birth/Sex: October 13, 1950 (69 y.o. M) Treating RN: Army Melia Primary Care Webster Patrone: [name] Other Clinician: Referring Ashutosh Dieguez: [name] Treating Diannie Willner/Extender: Melburn Hake, HOYT Weeks in Treatment: 4 Active Inactive Nutrition Nursing Diagnoses: Impaired glucose control: actual or potential Goals: Patient/caregiver verbalizes understanding of need to maintain therapeutic glucose control per primary care physician Date Initiated: 11/23/2019 Target Resolution Date: 12/11/2019 Goal Status: Active Interventions: Assess patient nutrition upon admission and as needed per policy Notes: Orientation to the Wound Care Program Nursing Diagnoses: Knowledge deficit related to the wound healing center program Goals: Patient/caregiver will verbalize understanding of the [employer] [place] Date Initiated: 11/23/2019 Target Resolution Date: 12/11/2019 Goal Status: Active Interventions: Provide education on orientation to the wound center Notes: Wound/Skin Impairment Nursing Diagnoses: Impaired tissue integrity Goals: Ulcer/skin breakdown will have a volume reduction of 30% by  week 4 Date Initiated: 11/23/2019 Target Resolution Date: 12/11/2019 Goal Status: Active Interventions: Assess ulceration(s) every visit Notes: Electronic Signature(s) Signed: 12/21/2019 4:14:01 PM By: Army Melia Entered By: Army Melia on 12/21/2019 13:21:12 TYRIC, RODEHEAVER ([id number]) [address], Linus Mako ([id number]) -------------------------------------------------------------------------------- Pain Assessment Details Patient Name: [name] Date of Service: 12/21/2019 1:00 PM Medical Record Number: [id number] Patient Account Number: [id number] Date of Birth/Sex: Jul 03, 1951 (69 y.o. M) Treating RN: Montey Hora Primary Care Dylana Shaw: [name] Other Clinician: Referring Artemisa Sladek: [name] Treating Shauntelle Jamerson/Extender: Melburn Hake, HOYT Weeks in Treatment: 4 Active Problems Location of Pain Severity and Description of Pain Patient Has Paino Yes Site Locations Pain Location: Generalized Pain, Pain in Ulcers With Dressing Change: No Duration of the Pain. Constant / Intermittento Constant Pain Management and Medication Current Pain Management: Electronic Signature(s) Signed: 12/21/2019 4:17:09 PM By: Montey Hora Entered By: Montey Hora on 12/21/2019 13:03:53 [name] ([id number]) -------------------------------------------------------------------------------- Patient/Caregiver Education Details Patient Name: [name] Date of Service: 12/21/2019 1:00 PM Medical Record Number: [id number] Patient Account Number: [id number] Date of Birth/Gender: 01/15/1951 (69 y.o. M) Treating RN: Army Melia Primary Care Physician: [name] Other Clinician: Referring Physician: [name] Treating Physician/Extender: Sharalyn Ink in Treatment: 4 Education Assessment Education Provided To: Patient Education Topics Provided Wound/Skin Impairment:  Handouts: Caring for Your Ulcer Methods: Demonstration, Explain/Verbal Responses: State content  correctly Electronic Signature(s) Signed: 12/21/2019 4:14:01 PM By: Army Melia Entered By: Army Melia on 12/21/2019 13:27:11 [name] ([id number]) -------------------------------------------------------------------------------- Wound Assessment Details Patient Name: ARMEL, RABBANI A. Date of Service: 12/21/2019 1:00 PM Medical Record Number: [id number] Patient Account Number: [id number] Date of Birth/Sex: 05/22/1951 (69 y.o. M) Treating RN: Montey Hora Primary Care Tanasha Menees: [name] Other Clinician: Referring Deshayla Empson: [name] Treating Alphia Behanna/Extender: Melburn Hake, HOYT Weeks in Treatment: 4 Wound Status Wound Number: 10 Primary Lymphedema Etiology: Wound Location: Left Lower Leg - Lateral Wound Status: Open Wounding Event: Gradually Appeared Comorbid Cataracts, Anemia, Sleep Apnea, Hypertension, Date Acquired: 12/21/2019 History: Cirrhosis , Type II Diabetes Weeks Of Treatment: 0 Clustered Wound: No Photos Photo Uploaded By: Montey Hora on 12/21/2019 13:23:37 Wound Measurements Length: (cm) 1 Width: (cm) 0.8 Depth: (cm) 0.1 Area: (cm) 0.628 Volume: (cm) 0.063 % Reduction in Area: % Reduction in Volume: Epithelialization: None Tunneling: No Undermining: No Wound Description Classification: Full Thickness Without Exposed Support Str Wound Margin: Flat and Intact Exudate Amount: Medium Exudate Type: Serous Exudate Color: amber uctures Foul Odor After Cleansing: No Slough/Fibrino Yes Wound Bed Granulation Amount: Medium (34-66%) Exposed Structure Granulation Quality: Pink Fascia Exposed: No Necrotic Amount: Medium (34-66%) Fat Layer (Subcutaneous Tissue) Exposed: Yes Necrotic Quality: Adherent Slough Tendon Exposed: No Muscle Exposed: No Joint Exposed: No Bone Exposed: No Treatment Notes Wound #10 (Left, Lateral Lower Leg) Notes unna boots bilateral Electronic Signature(s) ALOYSIUS, HEINLE ([id number]) Signed: 12/21/2019 4:17:09 PM By:  Montey Hora Entered By: Montey Hora on 12/21/2019 13:18:35 [name] ([id number]) -------------------------------------------------------------------------------- Wound Assessment Details Patient Name: [name] Date of Service: 12/21/2019 1:00 PM Medical Record Number: [id number] Patient Account Number: [id number] Date of Birth/Sex: [birth year] (69 y.o. M) Treating RN: Montey Hora Primary Care Ameen Mostafa: [name] Other Clinician: Referring Gonsalo Cuthbertson: [name] Treating Mikhala Kenan/Extender: Melburn Hake, HOYT Weeks in Treatment: 4 Wound Status Wound Number: 9 Primary Lymphedema Etiology: Wound Location: Left Lower Leg - Medial Wound Status: Open Wounding Event: Gradually Appeared Comorbid Cataracts, Anemia, Sleep Apnea, Hypertension, Date Acquired: 12/21/2019 History: Cirrhosis , Type II Diabetes Weeks Of Treatment: 0 Clustered Wound: No Photos Photo Uploaded By: Montey Hora on 12/21/2019 13:23:37 Wound Measurements Length: (cm) 1.1 Width: (cm) 0.6 Depth: (cm) 0.1 Area: (cm) 0.518 Volume: (cm) 0.052 % Reduction in Area: % Reduction in Volume: Epithelialization: Small (1-33%) Tunneling: No Undermining: No Wound Description Classification: Full Thickness Without Exposed Support Str Wound Margin: Flat and Intact Exudate Amount: Medium Exudate Type: Serous Exudate Color: amber uctures Foul Odor After Cleansing: No Slough/Fibrino Yes Wound Bed Granulation Amount: Medium (34-66%) Exposed Structure Granulation Quality: Pink Fascia Exposed: No Necrotic Amount: Medium (34-66%) Fat Layer (Subcutaneous Tissue) Exposed: Yes Necrotic Quality: Adherent Slough Tendon Exposed: No Muscle Exposed: No Joint Exposed: No Bone Exposed: No Treatment Notes Wound #9 (Left, Medial Lower Leg) Notes unna boots bilateral Electronic Signature(s) FRANCISO, DIERKS ([id number]) Signed: 12/21/2019 4:17:09 PM By: Montey Hora Entered By: Montey Hora on 12/21/2019  13:17:29 [name] ([id number]) -------------------------------------------------------------------------------- [employer] Details Patient Name: [name] Date of Service: 12/21/2019 1:00 PM Medical Record Number: [id number] Patient Account Number: [id number] Date of Birth/Sex: 19-Apr-1951 (69 y.o. M) Treating RN: Montey Hora Primary Care Almendra Loria: [name] Other Clinician: Referring Derryl Uher: [name] Treating Leotha Voeltz/Extender: Melburn Hake, HOYT Weeks in Treatment: 4 Vital Signs Time Taken: 13:04 Temperature (F): 97.8 Height (in): 72 Pulse (bpm): 68 Weight (lbs): 318 Respiratory Rate (breaths/min): 22  Body Mass Index (BMI): 43.1 Blood Pressure (mmHg): 167/65 Reference Range: 80 - 120 mg / dl Electronic Signature(s) Signed: 12/21/2019 4:17:09 PM By: Montey Hora Entered By: Montey Hora on 12/21/2019 13:05:14

## 2019-12-21 NOTE — Progress Notes (Addendum)
LUDWIG, TUGWELL ([id number]) Visit Report for 12/21/2019 Chief Complaint Document Details Patient Name: Corey West, Corey West. Date of Service: 12/21/2019 1:00 PM Medical Record Number: [id number] Patient Account Number: [id number] Date of Birth/Sex: 02/11/1951 (69 y.o. M) Treating RN: Army Melia Primary Care Provider: [name] Other Clinician: Referring Provider: [name] Treating Provider/Extender: Melburn Hake, Koltan Portocarrero Weeks in Treatment: 4 Information Obtained from: Patient Chief Complaint Bilateral LE Ulcers Electronic Signature(s) Signed: 12/21/2019 1:05:30 PM By: Worthy Keeler PA-C Entered By: Worthy Keeler on 12/21/2019 13:05:30 Corey West ([id number]) -------------------------------------------------------------------------------- HPI Details Patient Name: Corey West Date of Service: 12/21/2019 1:00 PM Medical Record Number: [id number] Patient Account Number: [id number] Date of Birth/Sex: 27-May-1951 (69 y.o. M) Treating RN: Army Melia Primary Care Provider: [name] Other Clinician: Referring Provider: [name] Treating Provider/Extender: Melburn Hake, Mylea Roarty Weeks in Treatment: 4 History of Present Illness HPI Description: Pleasant 69 year old with a past medical history significant for morbid obesity, insulin-dependent type 2 diabetes (hemoglobin A1c 7.2 in January 2015), cirrhosis, and chronic kidney disease. He reports a left anterior calf ulcer since January 2015. He has had venous stasis ulcers in the past, which have healed spontaneously. No history of DVT. Does not regularly wear compression. Ambulating normally per his baseline. Recently completed a course of antibiotics. Treated with a Profore light and Coban 2 layer compression drainage, which both fell down and he cut off. Tolerated Coban lite compression bandage. Returns to clinic for followup. No complaints today. No pain at this time. No fever or chills. No open wounds at this time. He is having  difficulty finding compression garments that fit him. 04/23/17; this is now 69 year old man who returns to our clinic for nonhealing wounds on the right lateral and right posterior calf. He tells me that they have been present for 6 months. In the setting of severe right greater than left lower extremity lymphedema likely secondary to chronic venous insufficiency. He has been followed in the lymphedema clinic at least since early June with compression wraps although these have not really helped and the patient is here for our review of this situation. The patient has had problems with edema in his legs for years. He has not worn compression. He is type II diabetic that sounds poorly controlled. He also has obstructive sleep apnea and cirrhosis. He is occasional cigar smoker 04/30/17; patient is a 69 year old man who has severe bilateral lower extremity lymphedema stage III since his early 34s he tells me. This is no doubt secondary to chronic venous insufficiency. He was going to the lymphedema clinic who forwarded him here due to skin breakdown in the right leg. He has not previously worn compression stockings. He has a large area of denuded epithelium on the posterior leg. He has a more open wound on the right lateral leg 05/07/17 on evaluation today patient appears to be doing about the same his wounds are showing signs of improvement and looking fairly well. With that being said he is not having any severe discomfort just some mild aching due to the swelling and really this is worse on the left than the right and the right side is where he actually has his wounds. He has been tolerating the [employer] wraps without complication and states this is actually much better than the wraps that he was getting at the lymphedema clinic most recently. He is happy with these wraps. He has no nausea, vomiting, diarrhea and no fevers or chills. We are still waiting to hear  back in regard to the lymphedema pumps  which I do think would be beneficial for him even with the compression wraps he still has a significant amount of edema noted of the bilateral lower extremities. 05/15/17 on evaluation today patient's right lower extremity edema and weeping especially the posterior portion appears to be doing better. He still is having some issue here but fortunately it's minimal compared to previous weeks. He continues to tolerate the compression fairly well. Overall he is pleased with how things are progressing. No fevers, chills, nausea, or vomiting noted at this time. And patient is having only minimal discomfort. 05/22/17; the medial wound on the right leg actually looks considerably better and is epithelialized. He still has a small punched out area on the right lateral leg. Edema control is marginal end of the [employer]. We are working on getting him compression pumps 05/29/17; the patient's lymphedema pumps are apparently arriving tonight. In the meantime he took his Unna boots off last Sunday because the edema had progressed to the point that a word to titrate. Since then he has put nothing on his right leg and more specifically nothing on the right medial and lateral wounds 06/05/17; the patient has his lymphedema pumps and he is using them once a day on the right leg. There is a sizable reduction in the circumference of his leg although he arrived in clinic today somewhat short of breath. 07/02/17; since the patient was last in clinic he was admitted on 2 occasions. He was admitted from 9/12 through 9/13 with lactic acidosis weakness and felt to have left lower extremity cellulitis. Due to the elevated lactic acid level he was felt to have sepsis although this was ruled out and the lactic acidosis was felt secondary to his chronic cirrhosis. He was initially treated with IV vancomycin for cellulitis and redness of his left leg but then this was subsequently recognized to be secondary to chronic venous stasis and  chronic lymphedema. When he left the hospital he had Unna boots placed bilaterally. He was readmitted on 9/26 through 9/28 with GI bleeding. His EGD showed Barrett's esophagus and gastritis. He was discharged with a hemoglobin of 8 to follow-up with GI as an outpatient. There was nothing more about his lower extremities during this short hospital stay. The patient has not been using his external compression pumps fearing fluid overload and shortness of breath although he feels better now that his hemoglobin is stabilized. He continues to have epithelial loss on his bilateral lower extremities. 07/09/17; patient has a large but superficial area on the right posterior leg and on the left anterior leg. He tells me he is been using his external compression pumps once a day for the required hour. He states the pumps hurt his right ankle. 07/17/17; patient was transfused a further 2 units since he was here the last time. His hemoglobin was once again found to be low. He has not been able to use his external compression pumps because of a combination of prolonged power outage in his home and I think issues with his other medical care. His wounds have actually worsened he now has loss of epithelialization all the way from the right lateral to the right medial posteriorly. The left side which has anterior loss of epithelialization also appears to be worse on the lateral aspect of the leg where there is denuded epithelium now as well. Leaking edema fluid. He has bilateral lymphedema. 08/12/2017 -- the patient has not been here for  25 days and has had the same compression wraps which have not been changed!! He says he was being worked up for a GI bleed and had some transfusions since he was seen last by Dr. Dellia Nims 08/20/17; mostly by review of what the patient is saying and a quick glance at his records in care everywhere I think this patient probably has Sarkis, Rhines, [place] A. ([id number]) cirrhosis and portal  hypertension. He is bleeding I think from gastric area and he is requiring recurrent transfusions. He has been told that there is nothing more that can be done and that his prognosis is poor. He arrived in our office in our office in tears. We've been using silver alginate and 3 layer compression to his bilateral lower extremity wounds. He has significant lymphedema likely secondary to chronic venous insufficiency. He has external compression pumps at home that he had actually before he came to this clinic but he is not using 08/27/17; patient is doing well, wounds on his left leg are tiny one anteriorly and one laterally. He still has substantial wound on the posterior aspect of his right calf extending laterally for over this looks better than last time. He tells me he is able to use his compression pumps once a day on most days. He has his hemoglobin checked on Friday 09-19-17 He arrives today for follow up evaluation. He was unable to come to the clinic last week r/t weather conditions, therefore compression has been in place for two weeks. He presents today with healed LLE and one partial thickness open area to the posterior RL. He states he is experiencing diarrhea secondary to antibiotic that will be stopped next week and has been unable to use lymphedema pumps. Will continue with compression bilaterally, follow up next week with plans to discharge and transition to lymphedema pumps 10/02/17; the patient arrives today with his juxta light stockings bilaterally. He also has external compression pumps at home. There are no open areas on his bilateral legs. He tells me he has had adjustments in his diuretics and had resultant hypokalemia but the amount of edema in his legs is a lot better although he stillhas changes of lymphedema 10/16/17; the patient has been using his juxta lites although they are not put on as high as we might like. He has very superficial excoriations at the top of his wraps but  I don't think that anything to keep him in clinic here. Severe bilateral chronic venous insufficiency with secondary lymphedema. He has no open wound areas Readmission: 11/23/2019 patient presents today for readmission he was last seen here in the clinic on 10/16/2017. With that being said he is unfortunately continue to have issues with lower extremity edema though he was using his lymphedema pumps on a fairly regular basis. Fortunately there is no signs of infection at this time which is good news there is a little bit of erythema that have me wondering about the possibility of infection but again I think this may just be secondary to the inflammation from the lymphedema and the current weeping causing skin breakdown as well. His plantar aspect of his foot on the left lower extremity actually is sloughing off a lot of callus skin externally there is nothing open under this area but nonetheless it does seem to be causing him some difficulty. Fortunately there is no signs of active infection at this time. No fevers, chills, nausea, vomiting, or diarrhea. The patient does have a history of diabetes mellitus type 2, hypertension,  and lymphedema. 12/07/2019 upon evaluation today patient appears to be doing well with regard to the overall appearance of his lower extremities currently. He has been tolerating the dressing changes without complication. Fortunately his infection seems to be doing much better at this point. He has made excellent progress. With that being said there are no signs of active infection systemically at this point. 12/21/2019 upon evaluation today patient appears to be doing slightly better with regard to his lower extremities. Some of the areas are definitely improving from the standpoint of the size of any open/weeping areas. He still has significant lymphedema. He has been in the hospital and apparently was admitted from 12/14/2019 through 12/17/2019. Subsequently he was diagnosed with  cellulitis of the lower extremity along with acute renal failure and he went in initially due to the fact that he was having issues with breathing. He does have congestive heart failure and has had a lot of issues with fluid here. Currently has BNP was 266 on 11/27/2019 and then subsequently at discharge on 12/17/2019 was 256. Looking through his sepsis lab work it appears that he was septic and that he was placed on Cipro 500 mg a day for 10 days secondary to a Klebsiella oxytoca bacteremia. The patient has had a DVT study but still has not had a arterial study with ABI and TBI we probably need to go ahead and see about making this referral back to vascular as we still do not really have a definitive answer at this point. Managed with regard to what his arterial flow is into the lower extremity. Electronic Signature(s) Signed: 12/21/2019 1:36:36 PM By: Worthy Keeler PA-C Entered By: Worthy Keeler on 12/21/2019 13:36:36 Corey West ([id number]) -------------------------------------------------------------------------------- Physical Exam Details Patient Name: JHAYDEN, DEMURO. Date of Service: 12/21/2019 1:00 PM Medical Record Number: [id number] Patient Account Number: [id number] Date of Birth/Sex: 07/29/1951 (69 y.o. M) Treating RN: Army Melia Primary Care Provider: [name] Other Clinician: Referring Provider: [name] Treating Provider/Extender: Melburn Hake, Eastin Swing Weeks in Treatment: 4 Constitutional Well-nourished and well-hydrated in no acute distress. Respiratory normal breathing without difficulty. Psychiatric this patient is able to make decisions and demonstrates good insight into disease process. Alert and Oriented x 3. pleasant and cooperative. Notes Patient's wound bed currently showed signs of really doing fairly well he does not have wounds as much as he does weeping and leaking areas of left lower extremity the right lower extremity is actually doing quite well at  this point. There is no signs of active infection which is good news. Electronic Signature(s) Signed: 12/21/2019 1:36:58 PM By: Worthy Keeler PA-C Entered By: Worthy Keeler on 12/21/2019 13:36:57 Corey West ([id number]) -------------------------------------------------------------------------------- Physician Orders Details Patient Name: ALYAS, CREARY A. Date of Service: 12/21/2019 1:00 PM Medical Record Number: [id number] Patient Account Number: [id number] Date of Birth/Sex: 11-May-1951 (69 y.o. M) Treating RN: Army Melia Primary Care Provider: [name] Other Clinician: Referring Provider: [name] Treating Provider/Extender: Melburn Hake, Jayzen Paver Weeks in Treatment: 4 Verbal / Phone Orders: No Diagnosis Coding ICD-10 Coding Code Description I89.0 Lymphedema, not elsewhere classified L97.812 Non-pressure chronic ulcer of other part of right lower leg with fat layer exposed L97.822 Non-pressure chronic ulcer of other part of left lower leg with fat layer exposed E11.622 Type 2 diabetes mellitus with other skin ulcer I10 Essential (primary) hypertension Wound Cleansing Wound #10 Left,Lateral Lower Leg o Cleanse wound with mild soap and water Wound #9 Left,Medial Lower Leg o Cleanse wound with  mild soap and water Dressing Change Frequency Wound #10 Left,Lateral Lower Leg o Dressing is to be changed Monday and Thursday. Wound #9 Left,Medial Lower Leg o Dressing is to be changed Monday and Thursday. Follow-up Appointments Wound #10 Left,Lateral Lower Leg o Return Appointment in 1 week. Wound #9 Left,Medial Lower Leg o Return Appointment in 1 week. Edema Control Wound #10 Left,Lateral Lower Leg o Unna Boots Bilaterally - ABD pads up shin for protection Wound #9 Left,Medial Lower Leg o Unna Boots Bilaterally - ABD pads up shin for protection Home Health Wound #10 Left,Lateral Lower Leg o [place] Visits - [place] Nurse  may visit PRN to address patientos wound care needs. o FACE TO FACE ENCOUNTER: MEDICARE and MEDICAID PATIENTS: I certify that this patient is under my care and that I had a face-to- face encounter that meets the physician face-to-face encounter requirements with this patient on this date. The encounter with the patient was in whole or in part for the following MEDICAL CONDITION: (primary reason for [place]) MEDICAL NECESSITY: I certify, that based on my findings, NURSING services are a medically necessary home health service. HOME BOUND STATUS: I certify that my clinical findings support that this patient is homebound (i.e., Due to illness or injury, pt requires aid of supportive devices such as crutches, cane, wheelchairs, walkers, the use of special transportation or the assistance of another person to leave their place of residence. There is a normal inability to leave the home and doing so requires considerable and taxing effort. Other absences are for medical reasons / religious services and are infrequent or of short duration when for other reasons). o If current dressing causes regression in wound condition, may D/C ordered dressing product/s and apply Normal Saline Moist Dressing daily until next [place] / Other MD appointment. [place] of regression in wound condition at [phone]. o Please direct any NON-WOUND related issues/requests for orders to patient's Primary Care Physician TOY, EISEMANN ([id number]) Wound #9 [place] Visits - [place] Nurse may visit PRN to address patientos wound care needs. o FACE TO FACE ENCOUNTER: MEDICARE and MEDICAID PATIENTS: I certify that this patient is under my care and that I had a face-to- face encounter that meets the physician face-to-face encounter requirements with this patient on this date. The encounter with the patient was in whole or in  part for the following MEDICAL CONDITION: (primary reason for [place]) MEDICAL NECESSITY: I certify, that based on my findings, NURSING services are a medically necessary home health service. HOME BOUND STATUS: I certify that my clinical findings support that this patient is homebound (i.e., Due to illness or injury, pt requires aid of supportive devices such as crutches, cane, wheelchairs, walkers, the use of special transportation or the assistance of another person to leave their place of residence. There is a normal inability to leave the home and doing so requires considerable and taxing effort. Other absences are for medical reasons / religious services and are infrequent or of short duration when for other reasons). o If current dressing causes regression in wound condition, may D/C ordered dressing product/s and apply Normal Saline Moist Dressing daily until next [place] / Other MD appointment. [place] of regression in wound condition at [phone]. o Please direct any NON-WOUND related issues/requests for orders to patient's Primary Care Physician Electronic Signature(s) Signed: 12/21/2019 4:14:01 PM By: Army Melia  Signed: 12/21/2019 4:32:11 PM By: Worthy Keeler PA-C Entered By: Army Melia on 12/21/2019 13:26:36 Corey West ([id number]) -------------------------------------------------------------------------------- Problem List Details Patient Name: KENNIEL, BERGSMA A. Date of Service: 12/21/2019 1:00 PM Medical Record Number: [id number] Patient Account Number: [id number] Date of Birth/Sex: 08/15/1951 (69 y.o. M) Treating RN: Army Melia Primary Care Provider: [name] Other Clinician: Referring Provider: [name] Treating Provider/Extender: Melburn Hake, Jt Brabec Weeks in Treatment: 4 Active Problems ICD-10 Evaluated Encounter Code Description Active Date Today Diagnosis I89.0 Lymphedema, not elsewhere classified 11/23/2019 No  Yes L97.812 Non-pressure chronic ulcer of other part of right lower leg with fat layer 11/23/2019 No Yes exposed L97.822 Non-pressure chronic ulcer of other part of left lower leg with fat layer 11/23/2019 No Yes exposed E11.622 Type 2 diabetes mellitus with other skin ulcer 11/23/2019 No Yes I10 Essential (primary) hypertension 11/23/2019 No Yes Inactive Problems Resolved Problems Electronic Signature(s) Signed: 12/21/2019 1:05:24 PM By: Worthy Keeler PA-C Entered By: Worthy Keeler on 12/21/2019 13:05:23 Corey West ([id number]) -------------------------------------------------------------------------------- Progress Note Details Patient Name: Corey West Date of Service: 12/21/2019 1:00 PM Medical Record Number: [id number] Patient Account Number: [id number] Date of Birth/Sex: 12-Jun-1951 (68 y.o. M) Treating RN: Army Melia Primary Care Provider: [name] Other Clinician: Referring Provider: [name] Treating Provider/Extender: Melburn Hake, Aliz Meritt Weeks in Treatment: 4 Subjective Chief Complaint Information obtained from Patient Bilateral LE Ulcers History of Present Illness (HPI) Pleasant 69 year old with a past medical history significant for morbid obesity, insulin-dependent type 2 diabetes (hemoglobin A1c 7.2 in January 2015), cirrhosis, and chronic kidney disease. He reports a left anterior calf ulcer since January 2015. He has had venous stasis ulcers in the past, which have healed spontaneously. No history of DVT. Does not regularly wear compression. Ambulating normally per his baseline. Recently completed a course of antibiotics. Treated with a Profore light and Coban 2 layer compression drainage, which both fell down and he cut off. Tolerated Coban lite compression bandage. Returns to clinic for followup. No complaints today. No pain at this time. No fever or chills. No open wounds at this time. He is having difficulty finding compression garments that fit  him. 04/23/17; this is now 69 year old man who returns to our clinic for nonhealing wounds on the right lateral and right posterior calf. He tells me that they have been present for 6 months. In the setting of severe right greater than left lower extremity lymphedema likely secondary to chronic venous insufficiency. He has been followed in the lymphedema clinic at least since early June with compression wraps although these have not really helped and the patient is here for our review of this situation. The patient has had problems with edema in his legs for years. He has not worn compression. He is type II diabetic that sounds poorly controlled. He also has obstructive sleep apnea and cirrhosis. He is occasional cigar smoker 04/30/17; patient is a 69 year old man who has severe bilateral lower extremity lymphedema stage III since his early 77s he tells me. This is no doubt secondary to chronic venous insufficiency. He was going to the lymphedema clinic who forwarded him here due to skin breakdown in the right leg. He has not previously worn compression stockings. He has a large area of denuded epithelium on the posterior leg. He has a more open wound on the right lateral leg 05/07/17 on evaluation today patient appears to be doing about the same his wounds are showing signs of improvement and looking fairly well. With that  being said he is not having any severe discomfort just some mild aching due to the swelling and really this is worse on the left than the right and the right side is where he actually has his wounds. He has been tolerating the [employer] wraps without complication and states this is actually much better than the wraps that he was getting at the lymphedema clinic most recently. He is happy with these wraps. He has no nausea, vomiting, diarrhea and no fevers or chills. We are still waiting to hear back in regard to the lymphedema pumps which I do think would be beneficial for him even  with the compression wraps he still has a significant amount of edema noted of the bilateral lower extremities. 05/15/17 on evaluation today patient's right lower extremity edema and weeping especially the posterior portion appears to be doing better. He still is having some issue here but fortunately it's minimal compared to previous weeks. He continues to tolerate the compression fairly well. Overall he is pleased with how things are progressing. No fevers, chills, nausea, or vomiting noted at this time. And patient is having only minimal discomfort. 05/22/17; the medial wound on the right leg actually looks considerably better and is epithelialized. He still has a small punched out area on the right lateral leg. Edema control is marginal end of the [employer]. We are working on getting him compression pumps 05/29/17; the patient's lymphedema pumps are apparently arriving tonight. In the meantime he took his Unna boots off last Sunday because the edema had progressed to the point that a word to titrate. Since then he has put nothing on his right leg and more specifically nothing on the right medial and lateral wounds 06/05/17; the patient has his lymphedema pumps and he is using them once a day on the right leg. There is a sizable reduction in the circumference of his leg although he arrived in clinic today somewhat short of breath. 07/02/17; since the patient was last in clinic he was admitted on 2 occasions. He was admitted from 9/12 through 9/13 with lactic acidosis weakness and felt to have left lower extremity cellulitis. Due to the elevated lactic acid level he was felt to have sepsis although this was ruled out and the lactic acidosis was felt secondary to his chronic cirrhosis. He was initially treated with IV vancomycin for cellulitis and redness of his left leg but then this was subsequently recognized to be secondary to chronic venous stasis and chronic lymphedema. When he left the hospital he  had Unna boots placed bilaterally. He was readmitted on 9/26 through 9/28 with GI bleeding. His EGD showed Barrett's esophagus and gastritis. He was discharged with a hemoglobin of 8 to follow-up with GI as an outpatient. There was nothing more about his lower extremities during this short hospital stay. The patient has not been using his external compression pumps fearing fluid overload and shortness of breath although he feels better now that his hemoglobin is stabilized. He continues to have epithelial loss on his bilateral lower extremities. 07/09/17; patient has a large but superficial area on the right posterior leg and on the left anterior leg. He tells me he is been using his external compression pumps once a day for the required hour. He states the pumps hurt his right ankle. 07/17/17; patient was transfused a further 2 units since he was here the last time. His hemoglobin was once again found to be low. He has not been able to use  his external compression pumps because of a combination of prolonged power outage in his home and I think issues with his other medical care. His wounds have actually worsened he now has loss of epithelialization all the way from the right lateral to the right medial posteriorly. The left side which has anterior loss of epithelialization also appears to be worse on the lateral aspect of the leg where there is denuded epithelium now as DAMASO, LADAY ([id number]) well. Leaking edema fluid. He has bilateral lymphedema. 08/12/2017 -- the patient has not been here for 25 days and has had the same compression wraps which have not been changed!! He says he was being worked up for a GI bleed and had some transfusions since he was seen last by Dr. Dellia Nims 08/20/17; mostly by review of what the patient is saying and a quick glance at his records in care everywhere I think this patient probably has Karlene Lineman, cirrhosis and portal hypertension. He is bleeding I think from gastric  area and he is requiring recurrent transfusions. He has been told that there is nothing more that can be done and that his prognosis is poor. He arrived in our office in our office in tears. We've been using silver alginate and 3 layer compression to his bilateral lower extremity wounds. He has significant lymphedema likely secondary to chronic venous insufficiency. He has external compression pumps at home that he had actually before he came to this clinic but he is not using 08/27/17; patient is doing well, wounds on his left leg are tiny one anteriorly and one laterally. He still has substantial wound on the posterior aspect of his right calf extending laterally for over this looks better than last time. He tells me he is able to use his compression pumps once a day on most days. He has his hemoglobin checked on Friday 09-19-17 He arrives today for follow up evaluation. He was unable to come to the clinic last week r/t weather conditions, therefore compression has been in place for two weeks. He presents today with healed LLE and one partial thickness open area to the posterior RL. He states he is experiencing diarrhea secondary to antibiotic that will be stopped next week and has been unable to use lymphedema pumps. Will continue with compression bilaterally, follow up next week with plans to discharge and transition to lymphedema pumps 10/02/17; the patient arrives today with his juxta light stockings bilaterally. He also has external compression pumps at home. There are no open areas on his bilateral legs. He tells me he has had adjustments in his diuretics and had resultant hypokalemia but the amount of edema in his legs is a lot better although he stillhas changes of lymphedema 10/16/17; the patient has been using his juxta lites although they are not put on as high as we might like. He has very superficial excoriations at the top of his wraps but I don't think that anything to keep him in clinic  here. Severe bilateral chronic venous insufficiency with secondary lymphedema. He has no open wound areas Readmission: 11/23/2019 patient presents today for readmission he was last seen here in the clinic on 10/16/2017. With that being said he is unfortunately continue to have issues with lower extremity edema though he was using his lymphedema pumps on a fairly regular basis. Fortunately there is no signs of infection at this time which is good news there is a little bit of erythema that have me wondering about the possibility of infection but  again I think this may just be secondary to the inflammation from the lymphedema and the current weeping causing skin breakdown as well. His plantar aspect of his foot on the left lower extremity actually is sloughing off a lot of callus skin externally there is nothing open under this area but nonetheless it does seem to be causing him some difficulty. Fortunately there is no signs of active infection at this time. No fevers, chills, nausea, vomiting, or diarrhea. The patient does have a history of diabetes mellitus type 2, hypertension, and lymphedema. 12/07/2019 upon evaluation today patient appears to be doing well with regard to the overall appearance of his lower extremities currently. He has been tolerating the dressing changes without complication. Fortunately his infection seems to be doing much better at this point. He has made excellent progress. With that being said there are no signs of active infection systemically at this point. 12/21/2019 upon evaluation today patient appears to be doing slightly better with regard to his lower extremities. Some of the areas are definitely improving from the standpoint of the size of any open/weeping areas. He still has significant lymphedema. He has been in the hospital and apparently was admitted from 12/14/2019 through 12/17/2019. Subsequently he was diagnosed with cellulitis of the lower extremity along with acute  renal failure and he went in initially due to the fact that he was having issues with breathing. He does have congestive heart failure and has had a lot of issues with fluid here. Currently has BNP was 266 on 11/27/2019 and then subsequently at discharge on 12/17/2019 was 256. Looking through his sepsis lab work it appears that he was septic and that he was placed on Cipro 500 mg a day for 10 days secondary to a Klebsiella oxytoca bacteremia. The patient has had a DVT study but still has not had a arterial study with ABI and TBI we probably need to go ahead and see about making this referral back to vascular as we still do not really have a definitive answer at this point. Managed with regard to what his arterial flow is into the lower extremity. Objective Constitutional Well-nourished and well-hydrated in no acute distress. Vitals Time Taken: 1:04 PM, Height: 72 in, Weight: 318 lbs, BMI: 43.1, Temperature: 97.8 F, Pulse: 68 bpm, Respiratory Rate: 22 breaths/min, Blood Pressure: 167/65 mmHg. Respiratory normal breathing without difficulty. Psychiatric this patient is able to make decisions and demonstrates good insight into disease process. Alert and Oriented x 3. pleasant and cooperative. General Notes: Patient's wound bed currently showed signs of really doing fairly well he does not have wounds as much as he does weeping and EJ, PINSON A. ([id number]) leaking areas of left lower extremity the right lower extremity is actually doing quite well at this point. There is no signs of active infection which is good news. Integumentary (Hair, Skin) Wound #10 status is Open. Original cause of wound was Gradually Appeared. The wound is located on the Left,Lateral Lower Leg. The wound measures 1cm length x 0.8cm width x 0.1cm depth; 0.628cm^2 area and 0.063cm^3 volume. There is Fat Layer (Subcutaneous Tissue) Exposed exposed. There is no tunneling or undermining noted. There is a medium amount of  serous drainage noted. The wound margin is flat and intact. There is medium (34-66%) pink granulation within the wound bed. There is a medium (34-66%) amount of necrotic tissue within the wound bed including Adherent Slough. Wound #9 status is Open. Original cause of wound was Gradually Appeared. The wound is  located on the Left,Medial Lower Leg. The wound measures 1.1cm length x 0.6cm width x 0.1cm depth; 0.518cm^2 area and 0.052cm^3 volume. There is Fat Layer (Subcutaneous Tissue) Exposed exposed. There is no tunneling or undermining noted. There is a medium amount of serous drainage noted. The wound margin is flat and intact. There is medium (34-66%) pink granulation within the wound bed. There is a medium (34-66%) amount of necrotic tissue within the wound bed including Adherent Slough. Assessment Active Problems ICD-10 Lymphedema, not elsewhere classified Non-pressure chronic ulcer of other part of right lower leg with fat layer exposed Non-pressure chronic ulcer of other part of left lower leg with fat layer exposed Type 2 diabetes mellitus with other skin ulcer Essential (primary) hypertension Procedures There was a Haematologist Compression Therapy Procedure by Army Melia, RN. Post procedure Diagnosis Wound #: Same as Pre-Procedure Plan Wound Cleansing: Wound #10 Left,Lateral Lower Leg: Cleanse wound with mild soap and water Wound #9 Left,Medial Lower Leg: Cleanse wound with mild soap and water Dressing Change Frequency: Wound #10 Left,Lateral Lower Leg: Dressing is to be changed Monday and Thursday. Wound #9 Left,Medial Lower Leg: Dressing is to be changed Monday and Thursday. Follow-up Appointments: Wound #10 Left,Lateral Lower Leg: Return Appointment in 1 week. Wound #9 Left,Medial Lower Leg: Return Appointment in 1 week. Edema Control: Wound #10 Left,Lateral Lower Leg: Unna Boots Bilaterally - ABD pads up shin for protection Wound #9 Left,Medial Lower Leg: Unna Boots  Bilaterally - ABD pads up shin for protection Home Health: Wound #10 Left,Lateral Lower Leg: HENRIK, ORIHUELA ([id number]) [place] Visits - [place] Nurse may visit PRN to address patient s wound care needs. FACE TO FACE ENCOUNTER: MEDICARE and MEDICAID PATIENTS: I certify that this patient is under my care and that I had a face-to-face encounter that meets the physician face-to-face encounter requirements with this patient on this date. The encounter with the patient was in whole or in part for the following MEDICAL CONDITION: (primary reason for [place]) MEDICAL NECESSITY: I certify, that based on my findings, NURSING services are a medically necessary home health service. HOME BOUND STATUS: I certify that my clinical findings support that this patient is homebound (i.e., Due to illness or injury, pt requires aid of supportive devices such as crutches, cane, wheelchairs, walkers, the use of special transportation or the assistance of another person to leave their place of residence. There is a normal inability to leave the home and doing so requires considerable and taxing effort. Other absences are for medical reasons / religious services and are infrequent or of short duration when for other reasons). If current dressing causes regression in wound condition, may D/C ordered dressing product/s and apply Normal Saline Moist Dressing daily until next [place] / Other MD appointment. [place] of regression in wound condition at [phone]. Please direct any NON-WOUND related issues/requests for orders to patient's Primary Care Physician Wound #9 Left,Medial Lower Leg: [place] Nurse may visit PRN to address patient s wound care needs. FACE TO FACE ENCOUNTER: MEDICARE and MEDICAID PATIENTS: I certify that this patient is under my care and that I had a face-to-face encounter that meets the  physician face-to-face encounter requirements with this patient on this date. The encounter with the patient was in whole or in part for the following MEDICAL CONDITION: (primary reason for [place]) MEDICAL NECESSITY: I certify, that based on my findings, NURSING services are a  medically necessary home health service. HOME BOUND STATUS: I certify that my clinical findings support that this patient is homebound (i.e., Due to illness or injury, pt requires aid of supportive devices such as crutches, cane, wheelchairs, walkers, the use of special transportation or the assistance of another person to leave their place of residence. There is a normal inability to leave the home and doing so requires considerable and taxing effort. Other absences are for medical reasons / religious services and are infrequent or of short duration when for other reasons). If current dressing causes regression in wound condition, may D/C ordered dressing product/s and apply Normal Saline Moist Dressing daily until next [place] / Other MD appointment. [place] of regression in wound condition at [phone]. Please direct any NON-WOUND related issues/requests for orders to patient's Primary Care Physician 1. My suggestion at this time is good to be for the patient to continue to take his oral antibiotics as directed and he has a follow-up with Dr. Ola Spurr coming up in a few days he supposed to call make that appointment. 2. I would recommend as well that we continue with the bilateral Unna boot wraps. Patient is in agreement the plan. 3. I am also can recommend that with regard to his lymphedema pumps he discussed with his primary care provider whether or not they feel like that would be appropriate for him to do in light of his fluid overload status at this point. I am somewhat reluctant to tell him to do this and in fact for now told him to hold off. We will see patient back for  reevaluation in 1 week here in the clinic. If anything worsens or changes patient will contact our office for additional recommendations. Electronic Signature(s) Signed: 12/21/2019 1:37:53 PM By: Worthy Keeler PA-C Entered By: Worthy Keeler on 12/21/2019 13:37:52 ATTWOOD, Linus Mako ([id number]) -------------------------------------------------------------------------------- SuperBill Details Patient Name: ZYIER, DYKEMA A. Date of Service: 12/21/2019 Medical Record Number: [id number] Patient Account Number: [id number] Date of Birth/Sex: Jun 21, 1951 (69 y.o. M) Treating RN: Army Melia Primary Care Provider: [name] Other Clinician: Referring Provider: [name] Treating Provider/Extender: Melburn Hake, Aroura Vasudevan Weeks in Treatment: 4 Diagnosis Coding ICD-10 Codes Code Description I89.0 Lymphedema, not elsewhere classified L97.812 Non-pressure chronic ulcer of other part of right lower leg with fat layer exposed L97.822 Non-pressure chronic ulcer of other part of left lower leg with fat layer exposed E11.622 Type 2 diabetes mellitus with other skin ulcer I10 Essential (primary) hypertension Physician Procedures CPT4 Code: [id number] Description: 99214 - WC PHYS LEVEL 4 - EST PT Modifier: Quantity: 1 CPT4 Code: Description: ICD-10 Diagnosis Description I89.0 Lymphedema, not elsewhere classified L97.812 Non-pressure chronic ulcer of other part of right lower leg with fat layer L97.822 Non-pressure chronic ulcer of other part of left lower leg with fat layer e  P29.518 Type 2 diabetes mellitus with other skin ulcer Modifier: exposed xposed Quantity: Electronic Signature(s) Signed: 12/21/2019 1:38:14 PM By: Worthy Keeler PA-C Entered By: Worthy Keeler on 12/21/2019 13:38:14

## 2019-12-28 ENCOUNTER — Encounter: Payer: Medicare Other | Admitting: Physician Assistant

## 2019-12-28 ENCOUNTER — Other Ambulatory Visit: Payer: Self-pay

## 2019-12-28 DIAGNOSIS — E11621 Type 2 diabetes mellitus with foot ulcer: Secondary | ICD-10-CM | POA: Diagnosis not present

## 2019-12-28 NOTE — Progress Notes (Addendum)
GLENROY, CROSSEN ([id number]) Visit Report for 12/28/2019 Chief Complaint Document Details Patient Name: Corey West, Corey West. Date of Service: 12/28/2019 12:45 PM Medical Record Number: [id number] Patient Account Number: [id number] Date of Birth/Sex: 05-28-1951 (69 y.o. M) Treating RN: Army Melia Primary Care Provider: [name] Other Clinician: Referring Provider: [name] Treating Provider/Extender: Melburn Hake, HOYT Weeks in Treatment: 5 Information Obtained from: Patient Chief Complaint Bilateral LE Ulcers Electronic Signature(s) Signed: 12/28/2019 12:55:12 PM By: Worthy Keeler PA-C Entered By: Worthy Keeler on 12/28/2019 12:55:11 Corey West ([id number]) -------------------------------------------------------------------------------- HPI Details Patient Name: Corey West Date of Service: 12/28/2019 12:45 PM Medical Record Number: [id number] Patient Account Number: [id number] Date of Birth/Sex: [birth year] (69 y.o. M) Treating RN: Army Melia Primary Care Provider: [name] Other Clinician: Referring Provider: [name] Treating Provider/Extender: Melburn Hake, HOYT Weeks in Treatment: 5 History of Present Illness HPI Description: Pleasant 69 year old with a past medical history significant for morbid obesity, insulin-dependent type 2 diabetes (hemoglobin A1c 7.2 in January 2015), cirrhosis, and chronic kidney disease. He reports a left anterior calf ulcer since January 2015. He has had venous stasis ulcers in the past, which have healed spontaneously. No history of DVT. Does not regularly wear compression. Ambulating normally per his baseline. Recently completed a course of antibiotics. Treated with a Profore light and Coban 2 layer compression drainage, which both fell down and he cut off. Tolerated Coban lite compression bandage. Returns to clinic for followup. No complaints today. No pain at this time. No fever or chills. No open wounds at this time. He is having  difficulty finding compression garments that fit him. 04/23/17; this is now 69 year old man who returns to our clinic for nonhealing wounds on the right lateral and right posterior calf. He tells me that they have been present for 6 months. In the setting of severe right greater than left lower extremity lymphedema likely secondary to chronic venous insufficiency. He has been followed in the lymphedema clinic at least since early June with compression wraps although these have not really helped and the patient is here for our review of this situation. The patient has had problems with edema in his legs for years. He has not worn compression. He is type II diabetic that sounds poorly controlled. He also has obstructive sleep apnea and cirrhosis. He is occasional cigar smoker 04/30/17; patient is a 69 year old man who has severe bilateral lower extremity lymphedema stage III since his early 52s he tells me. This is no doubt secondary to chronic venous insufficiency. He was going to the lymphedema clinic who forwarded him here due to skin breakdown in the right leg. He has not previously worn compression stockings. He has a large area of denuded epithelium on the posterior leg. He has a more open wound on the right lateral leg 05/07/17 on evaluation today patient appears to be doing about the same his wounds are showing signs of improvement and looking fairly well. With that being said he is not having any severe discomfort just some mild aching due to the swelling and really this is worse on the left than the right and the right side is where he actually has his wounds. He has been tolerating the [employer] wraps without complication and states this is actually much better than the wraps that he was getting at the lymphedema clinic most recently. He is happy with these wraps. He has no nausea, vomiting, diarrhea and no fevers or chills. We are still waiting to hear  back in regard to the lymphedema pumps  which I do think would be beneficial for him even with the compression wraps he still has a significant amount of edema noted of the bilateral lower extremities. 05/15/17 on evaluation today patient's right lower extremity edema and weeping especially the posterior portion appears to be doing better. He still is having some issue here but fortunately it's minimal compared to previous weeks. He continues to tolerate the compression fairly well. Overall he is pleased with how things are progressing. No fevers, chills, nausea, or vomiting noted at this time. And patient is having only minimal discomfort. 05/22/17; the medial wound on the right leg actually looks considerably better and is epithelialized. He still has a small punched out area on the right lateral leg. Edema control is marginal end of the [employer]. We are working on getting him compression pumps 05/29/17; the patient's lymphedema pumps are apparently arriving tonight. In the meantime he took his Unna boots off last Sunday because the edema had progressed to the point that a word to titrate. Since then he has put nothing on his right leg and more specifically nothing on the right medial and lateral wounds 06/05/17; the patient has his lymphedema pumps and he is using them once a day on the right leg. There is a sizable reduction in the circumference of his leg although he arrived in clinic today somewhat short of breath. 07/02/17; since the patient was last in clinic he was admitted on 2 occasions. He was admitted from 9/12 through 9/13 with lactic acidosis weakness and felt to have left lower extremity cellulitis. Due to the elevated lactic acid level he was felt to have sepsis although this was ruled out and the lactic acidosis was felt secondary to his chronic cirrhosis. He was initially treated with IV vancomycin for cellulitis and redness of his left leg but then this was subsequently recognized to be secondary to chronic venous stasis and  chronic lymphedema. When he left the hospital he had Unna boots placed bilaterally. He was readmitted on 9/26 through 9/28 with GI bleeding. His EGD showed Barrett's esophagus and gastritis. He was discharged with a hemoglobin of 8 to follow-up with GI as an outpatient. There was nothing more about his lower extremities during this short hospital stay. The patient has not been using his external compression pumps fearing fluid overload and shortness of breath although he feels better now that his hemoglobin is stabilized. He continues to have epithelial loss on his bilateral lower extremities. 07/09/17; patient has a large but superficial area on the right posterior leg and on the left anterior leg. He tells me he is been using his external compression pumps once a day for the required hour. He states the pumps hurt his right ankle. 07/17/17; patient was transfused a further 2 units since he was here the last time. His hemoglobin was once again found to be low. He has not been able to use his external compression pumps because of a combination of prolonged power outage in his home and I think issues with his other medical care. His wounds have actually worsened he now has loss of epithelialization all the way from the right lateral to the right medial posteriorly. The left side which has anterior loss of epithelialization also appears to be worse on the lateral aspect of the leg where there is denuded epithelium now as well. Leaking edema fluid. He has bilateral lymphedema. 08/12/2017 -- the patient has not been here for  25 days and has had the same compression wraps which have not been changed!! He says he was being worked up for a GI bleed and had some transfusions since he was seen last by Dr. Dellia Nims 08/20/17; mostly by review of what the patient is saying and a quick glance at his records in care everywhere I think this patient probably has [name], [name], [place] A. ([id number]) cirrhosis and portal  hypertension. He is bleeding I think from gastric area and he is requiring recurrent transfusions. He has been told that there is nothing more that can be done and that his prognosis is poor. He arrived in our office in our office in tears. We've been using silver alginate and 3 layer compression to his bilateral lower extremity wounds. He has significant lymphedema likely secondary to chronic venous insufficiency. He has external compression pumps at home that he had actually before he came to this clinic but he is not using 08/27/17; patient is doing well, wounds on his left leg are tiny one anteriorly and one laterally. He still has substantial wound on the posterior aspect of his right calf extending laterally for over this looks better than last time. He tells me he is able to use his compression pumps once a day on most days. He has his hemoglobin checked on Friday 09-19-17 He arrives today for follow up evaluation. He was unable to come to the clinic last week r/t weather conditions, therefore compression has been in place for two weeks. He presents today with healed LLE and one partial thickness open area to the posterior RL. He states he is experiencing diarrhea secondary to antibiotic that will be stopped next week and has been unable to use lymphedema pumps. Will continue with compression bilaterally, follow up next week with plans to discharge and transition to lymphedema pumps 10/02/17; the patient arrives today with his juxta light stockings bilaterally. He also has external compression pumps at home. There are no open areas on his bilateral legs. He tells me he has had adjustments in his diuretics and had resultant hypokalemia but the amount of edema in his legs is a lot better although he stillhas changes of lymphedema 10/16/17; the patient has been using his juxta lites although they are not put on as high as we might like. He has very superficial excoriations at the top of his wraps but  I don't think that anything to keep him in clinic here. Severe bilateral chronic venous insufficiency with secondary lymphedema. He has no open wound areas Readmission: 11/23/2019 patient presents today for readmission he was last seen here in the clinic on 10/16/2017. With that being said he is unfortunately continue to have issues with lower extremity edema though he was using his lymphedema pumps on a fairly regular basis. Fortunately there is no signs of infection at this time which is good news there is a little bit of erythema that have me wondering about the possibility of infection but again I think this may just be secondary to the inflammation from the lymphedema and the current weeping causing skin breakdown as well. His plantar aspect of his foot on the left lower extremity actually is sloughing off a lot of callus skin externally there is nothing open under this area but nonetheless it does seem to be causing him some difficulty. Fortunately there is no signs of active infection at this time. No fevers, chills, nausea, vomiting, or diarrhea. The patient does have a history of diabetes mellitus type 2, hypertension,  and lymphedema. 12/07/2019 upon evaluation today patient appears to be doing well with regard to the overall appearance of his lower extremities currently. He has been tolerating the dressing changes without complication. Fortunately his infection seems to be doing much better at this point. He has made excellent progress. With that being said there are no signs of active infection systemically at this point. 12/21/2019 upon evaluation today patient appears to be doing slightly better with regard to his lower extremities. Some of the areas are definitely improving from the standpoint of the size of any open/weeping areas. He still has significant lymphedema. He has been in the hospital and apparently was admitted from 12/14/2019 through 12/17/2019. Subsequently he was diagnosed with  cellulitis of the lower extremity along with acute renal failure and he went in initially due to the fact that he was having issues with breathing. He does have congestive heart failure and has had a lot of issues with fluid here. Currently has BNP was 266 on 11/27/2019 and then subsequently at discharge on 12/17/2019 was 256. Looking through his sepsis lab work it appears that he was septic and that he was placed on Cipro 500 mg a day for 10 days secondary to a Klebsiella oxytoca bacteremia. The patient has had a DVT study but still has not had a arterial study with ABI and TBI we probably need to go ahead and see about making this referral back to vascular as we still do not really have a definitive answer at this point. Managed with regard to what his arterial flow is into the lower extremity. 12/28/2019 upon evaluation today patient unfortunately continues to have significant issues with weeping from his left lower extremity. Unfortunately he continues to experience significant issues here with the wraps becoming increasingly saturated. Unfortunately this seems to be related to one area on the anterior/medial portion of his left lower extremity where this is draining significantly and constantly without any signs of slowing. There are couple other open spots on his leg but nothing that is leaking in the way that this is. His heel is severely macerated secondary to the drainage. Home health is changing his wraps on Wednesday and Friday and were doing this on Mondays. Unfortunately I think he really needs a stronger compression wrap but at the moment are really not able to do this for him as were still waiting vascular testing. Electronic Signature(s) Signed: 12/28/2019 1:29:48 PM By: Worthy Keeler PA-C Entered By: Worthy Keeler on 12/28/2019 13:29:48 Corey West ([id number]) -------------------------------------------------------------------------------- Physical Exam Details Patient Name:  Corey West, Corey West. Date of Service: 12/28/2019 12:45 PM Medical Record Number: [id number] Patient Account Number: [id number] Date of Birth/Sex: [birth year] (69 y.o. M) Treating RN: Army Melia Primary Care Provider: [name] Other Clinician: Referring Provider: [name] Treating Provider/Extender: Melburn Hake, HOYT Weeks in Treatment: 5 Constitutional Obese and well-hydrated in no acute distress. Respiratory normal breathing without difficulty. Psychiatric this patient is able to make decisions and demonstrates good insight into disease process. Alert and Oriented x 3. pleasant and cooperative. Notes Upon inspection patient's wounds actually showed signs of good granulation there did not appear to be any issues in that regard. However he has one area where there is significant drainage unfortunately at this point which I think is a major part of the issue. That is keeping everything much too macerated which in turn is slowing down any chance of healing. He does need stronger compression but again he also really probably needs venous testing  to see if there is anything that could be done from an ablation standpoint to help him out as well. Electronic Signature(s) Signed: 12/28/2019 1:30:18 PM By: Worthy Keeler PA-C Entered By: Worthy Keeler on 12/28/2019 13:30:18 Corey West ([id number]) -------------------------------------------------------------------------------- Physician Orders Details Patient Name: KATSUMI, WISLER A. Date of Service: 12/28/2019 12:45 PM Medical Record Number: [id number] Patient Account Number: [id number] Date of Birth/Sex: 27-Jan-1951 (69 y.o. M) Treating RN: Army Melia Primary Care Provider: [name] Other Clinician: Referring Provider: [name] Treating Provider/Extender: Melburn Hake, HOYT Weeks in Treatment: 5 Verbal / Phone Orders: No Diagnosis Coding ICD-10 Coding Code Description I89.0 Lymphedema, not elsewhere classified L97.812  Non-pressure chronic ulcer of other part of right lower leg with fat layer exposed L97.822 Non-pressure chronic ulcer of other part of left lower leg with fat layer exposed E11.622 Type 2 diabetes mellitus with other skin ulcer I10 Essential (primary) hypertension Wound Cleansing Wound #10 Left,Lateral Lower Leg o Cleanse wound with mild soap and water Wound #9 Left,Medial Lower Leg o Cleanse wound with mild soap and water Primary Wound Dressing Wound #10 Left,Lateral Lower Leg o XtraSorb - over weeping areas Wound #9 Left,Medial Lower Leg o XtraSorb - over weeping areas Dressing Change Frequency Wound #10 Left,Lateral Lower Leg o Change Dressing Monday, Wednesday, Friday - Monday in office, Wednesday and Friday by [facility] Wound #9 Left,Medial Lower Leg o Change Dressing Monday, Wednesday, Friday - Monday in office, Wednesday and Friday by [facility] Follow-up Appointments Wound #10 Left,Lateral Lower Leg o Return Appointment in 1 week. Wound #9 Left,Medial Lower Leg o Return Appointment in 1 week. Edema Control Wound #10 Left,Lateral Lower Leg o Unna Boots Bilaterally - ABD pads up shin for protection Wound #9 Left,Medial Lower Leg o Unna Boots Bilaterally - ABD pads up shin for protection [place] Visits - [place] Nurse may visit PRN to address patientos wound care needs. o FACE TO FACE ENCOUNTER: MEDICARE and MEDICAID PATIENTS: I certify that this patient is under my care and that I had a face-to- face encounter that meets the physician face-to-face encounter requirements with this patient on this date. The encounter with the patient was in whole or in part for the following MEDICAL CONDITION: (primary reason for [place]) MEDICAL NECESSITY: I certify, that based on my findings, NURSING services are a medically necessary home health service. HOME BOUND STATUS: I certify that my clinical findings support that this  patient is homebound (i.e., Due to illness or injury, pt requires aid of supportive Corey West, Corey West ([id number]) devices such as crutches, cane, wheelchairs, walkers, the use of special transportation or the assistance of another person to leave their place of residence. There is a normal inability to leave the home and doing so requires considerable and taxing effort. Other absences are for medical reasons / religious services and are infrequent or of short duration when for other reasons). o If current dressing causes regression in wound condition, may D/C ordered dressing product/s and apply Normal Saline Moist Dressing daily until next [place] / Other MD appointment. [place] of regression in wound condition at [phone]. o Please direct any NON-WOUND related issues/requests for orders to patient's Primary Care Physician Consults o Vascular - ABI TBI and reflux Electronic Signature(s) Signed: 12/28/2019 4:04:00 PM By: Army Melia Signed: 12/31/2019 5:29:46 PM By: Worthy Keeler PA-C Entered By: Army Melia on 12/28/2019 13:21:03 Corey West ([id number]) -------------------------------------------------------------------------------- Problem List Details Patient  Name: Yun, Edouard A. Date of Service: 12/28/2019 12:45 PM Medical Record Number: [id number] Patient Account Number: [id number] Date of Birth/Sex: 27-Mar-1951 (69 y.o. M) Treating RN: Army Melia Primary Care Provider: [name] Other Clinician: Referring Provider: [name] Treating Provider/Extender: Melburn Hake, HOYT Weeks in Treatment: 5 Active Problems ICD-10 Evaluated Encounter Code Description Active Date Today Diagnosis I89.0 Lymphedema, not elsewhere classified 11/23/2019 No Yes L97.812 Non-pressure chronic ulcer of other part of right lower leg with fat layer 11/23/2019 No Yes exposed L97.822 Non-pressure chronic ulcer of other part of left lower leg with fat layer  11/23/2019 No Yes exposed E11.622 Type 2 diabetes mellitus with other skin ulcer 11/23/2019 No Yes I10 Essential (primary) hypertension 11/23/2019 No Yes Inactive Problems Resolved Problems Electronic Signature(s) Signed: 12/28/2019 12:55:05 PM By: Worthy Keeler PA-C Entered By: Worthy Keeler on 12/28/2019 12:55:05 Corey West ([id number]) -------------------------------------------------------------------------------- Progress Note Details Patient Name: Corey West Date of Service: 12/28/2019 12:45 PM Medical Record Number: [id number] Patient Account Number: [id number] Date of Birth/Sex: 1951-01-16 (69 y.o. M) Treating RN: Army Melia Primary Care Provider: [name] Other Clinician: Referring Provider: [name] Treating Provider/Extender: Melburn Hake, HOYT Weeks in Treatment: 5 Subjective Chief Complaint Information obtained from Patient Bilateral LE Ulcers History of Present Illness (HPI) Pleasant 69 year old with a past medical history significant for morbid obesity, insulin-dependent type 2 diabetes (hemoglobin A1c 7.2 in January 2015), cirrhosis, and chronic kidney disease. He reports a left anterior calf ulcer since January 2015. He has had venous stasis ulcers in the past, which have healed spontaneously. No history of DVT. Does not regularly wear compression. Ambulating normally per his baseline. Recently completed a course of antibiotics. Treated with a Profore light and Coban 2 layer compression drainage, which both fell down and he cut off. Tolerated Coban lite compression bandage. Returns to clinic for followup. No complaints today. No pain at this time. No fever or chills. No open wounds at this time. He is having difficulty finding compression garments that fit him. 04/23/17; this is now 69 year old man who returns to our clinic for nonhealing wounds on the right lateral and right posterior calf. He tells me that they have been present for 6 months. In the  setting of severe right greater than left lower extremity lymphedema likely secondary to chronic venous insufficiency. He has been followed in the lymphedema clinic at least since early June with compression wraps although these have not really helped and the patient is here for our review of this situation. The patient has had problems with edema in his legs for years. He has not worn compression. He is type II diabetic that sounds poorly controlled. He also has obstructive sleep apnea and cirrhosis. He is occasional cigar smoker 04/30/17; patient is a 69 year old man who has severe bilateral lower extremity lymphedema stage III since his early 66s he tells me. This is no doubt secondary to chronic venous insufficiency. He was going to the lymphedema clinic who forwarded him here due to skin breakdown in the right leg. He has not previously worn compression stockings. He has a large area of denuded epithelium on the posterior leg. He has a more open wound on the right lateral leg 05/07/17 on evaluation today patient appears to be doing about the same his wounds are showing signs of improvement and looking fairly well. With that being said he is not having any severe discomfort just some mild aching due to the swelling and really this is worse on the left  than the right and the right side is where he actually has his wounds. He has been tolerating the [employer] wraps without complication and states this is actually much better than the wraps that he was getting at the lymphedema clinic most recently. He is happy with these wraps. He has no nausea, vomiting, diarrhea and no fevers or chills. We are still waiting to hear back in regard to the lymphedema pumps which I do think would be beneficial for him even with the compression wraps he still has a significant amount of edema noted of the bilateral lower extremities. 05/15/17 on evaluation today patient's right lower extremity edema and weeping especially  the posterior portion appears to be doing better. He still is having some issue here but fortunately it's minimal compared to previous weeks. He continues to tolerate the compression fairly well. Overall he is pleased with how things are progressing. No fevers, chills, nausea, or vomiting noted at this time. And patient is having only minimal discomfort. 05/22/17; the medial wound on the right leg actually looks considerably better and is epithelialized. He still has a small punched out area on the right lateral leg. Edema control is marginal end of the [employer]. We are working on getting him compression pumps 05/29/17; the patient's lymphedema pumps are apparently arriving tonight. In the meantime he took his Unna boots off last Sunday because the edema had progressed to the point that a word to titrate. Since then he has put nothing on his right leg and more specifically nothing on the right medial and lateral wounds 06/05/17; the patient has his lymphedema pumps and he is using them once a day on the right leg. There is a sizable reduction in the circumference of his leg although he arrived in clinic today somewhat short of breath. 07/02/17; since the patient was last in clinic he was admitted on 2 occasions. He was admitted from 9/12 through 9/13 with lactic acidosis weakness and felt to have left lower extremity cellulitis. Due to the elevated lactic acid level he was felt to have sepsis although this was ruled out and the lactic acidosis was felt secondary to his chronic cirrhosis. He was initially treated with IV vancomycin for cellulitis and redness of his left leg but then this was subsequently recognized to be secondary to chronic venous stasis and chronic lymphedema. When he left the hospital he had Unna boots placed bilaterally. He was readmitted on 9/26 through 9/28 with GI bleeding. His EGD showed Barrett's esophagus and gastritis. He was discharged with a hemoglobin of 8 to follow-up with  GI as an outpatient. There was nothing more about his lower extremities during this short hospital stay. The patient has not been using his external compression pumps fearing fluid overload and shortness of breath although he feels better now that his hemoglobin is stabilized. He continues to have epithelial loss on his bilateral lower extremities. 07/09/17; patient has a large but superficial area on the right posterior leg and on the left anterior leg. He tells me he is been using his external compression pumps once a day for the required hour. He states the pumps hurt his right ankle. 07/17/17; patient was transfused a further 2 units since he was here the last time. His hemoglobin was once again found to be low. He has not been able to use his external compression pumps because of a combination of prolonged power outage in his home and I think issues with his other medical care. His  wounds have actually worsened he now has loss of epithelialization all the way from the right lateral to the right medial posteriorly. The left side which has anterior loss of epithelialization also appears to be worse on the lateral aspect of the leg where there is denuded epithelium now as Corey West, Corey West ([id number]) well. Leaking edema fluid. He has bilateral lymphedema. 08/12/2017 -- the patient has not been here for 25 days and has had the same compression wraps which have not been changed!! He says he was being worked up for a GI bleed and had some transfusions since he was seen last by Dr. Dellia Nims 08/20/17; mostly by review of what the patient is saying and a quick glance at his records in care everywhere I think this patient probably has Karlene Lineman, cirrhosis and portal hypertension. He is bleeding I think from gastric area and he is requiring recurrent transfusions. He has been told that there is nothing more that can be done and that his prognosis is poor. He arrived in our office in our office in tears. We've been  using silver alginate and 3 layer compression to his bilateral lower extremity wounds. He has significant lymphedema likely secondary to chronic venous insufficiency. He has external compression pumps at home that he had actually before he came to this clinic but he is not using 08/27/17; patient is doing well, wounds on his left leg are tiny one anteriorly and one laterally. He still has substantial wound on the posterior aspect of his right calf extending laterally for over this looks better than last time. He tells me he is able to use his compression pumps once a day on most days. He has his hemoglobin checked on Friday 09-19-17 He arrives today for follow up evaluation. He was unable to come to the clinic last week r/t weather conditions, therefore compression has been in place for two weeks. He presents today with healed LLE and one partial thickness open area to the posterior RL. He states he is experiencing diarrhea secondary to antibiotic that will be stopped next week and has been unable to use lymphedema pumps. Will continue with compression bilaterally, follow up next week with plans to discharge and transition to lymphedema pumps 10/02/17; the patient arrives today with his juxta light stockings bilaterally. He also has external compression pumps at home. There are no open areas on his bilateral legs. He tells me he has had adjustments in his diuretics and had resultant hypokalemia but the amount of edema in his legs is a lot better although he stillhas changes of lymphedema 10/16/17; the patient has been using his juxta lites although they are not put on as high as we might like. He has very superficial excoriations at the top of his wraps but I don't think that anything to keep him in clinic here. Severe bilateral chronic venous insufficiency with secondary lymphedema. He has no open wound areas Readmission: 11/23/2019 patient presents today for readmission he was last seen here in the  clinic on 10/16/2017. With that being said he is unfortunately continue to have issues with lower extremity edema though he was using his lymphedema pumps on a fairly regular basis. Fortunately there is no signs of infection at this time which is good news there is a little bit of erythema that have me wondering about the possibility of infection but again I think this may just be secondary to the inflammation from the lymphedema and the current weeping causing skin breakdown as well. His plantar  aspect of his foot on the left lower extremity actually is sloughing off a lot of callus skin externally there is nothing open under this area but nonetheless it does seem to be causing him some difficulty. Fortunately there is no signs of active infection at this time. No fevers, chills, nausea, vomiting, or diarrhea. The patient does have a history of diabetes mellitus type 2, hypertension, and lymphedema. 12/07/2019 upon evaluation today patient appears to be doing well with regard to the overall appearance of his lower extremities currently. He has been tolerating the dressing changes without complication. Fortunately his infection seems to be doing much better at this point. He has made excellent progress. With that being said there are no signs of active infection systemically at this point. 12/21/2019 upon evaluation today patient appears to be doing slightly better with regard to his lower extremities. Some of the areas are definitely improving from the standpoint of the size of any open/weeping areas. He still has significant lymphedema. He has been in the hospital and apparently was admitted from 12/14/2019 through 12/17/2019. Subsequently he was diagnosed with cellulitis of the lower extremity along with acute renal failure and he went in initially due to the fact that he was having issues with breathing. He does have congestive heart failure and has had a lot of issues with fluid here. Currently has BNP  was 266 on 11/27/2019 and then subsequently at discharge on 12/17/2019 was 256. Looking through his sepsis lab work it appears that he was septic and that he was placed on Cipro 500 mg a day for 10 days secondary to a Klebsiella oxytoca bacteremia. The patient has had a DVT study but still has not had a arterial study with ABI and TBI we probably need to go ahead and see about making this referral back to vascular as we still do not really have a definitive answer at this point. Managed with regard to what his arterial flow is into the lower extremity. 12/28/2019 upon evaluation today patient unfortunately continues to have significant issues with weeping from his left lower extremity. Unfortunately he continues to experience significant issues here with the wraps becoming increasingly saturated. Unfortunately this seems to be related to one area on the anterior/medial portion of his left lower extremity where this is draining significantly and constantly without any signs of slowing. There are couple other open spots on his leg but nothing that is leaking in the way that this is. His heel is severely macerated secondary to the drainage. Home health is changing his wraps on Wednesday and Friday and were doing this on Mondays. Unfortunately I think he really needs a stronger compression wrap but at the moment are really not able to do this for him as were still waiting vascular testing. Objective Constitutional Obese and well-hydrated in no acute distress. Vitals Time Taken: 12:50 PM, Height: 72 in, Weight: 318 lbs, BMI: 43.1, Temperature: 98.0 F, Pulse: 80 bpm, Respiratory Rate: 18 breaths/min, Blood Pressure: 174/62 mmHg. Respiratory DAVIONE, LENKER ([id number]) normal breathing without difficulty. Psychiatric this patient is able to make decisions and demonstrates good insight into disease process. Alert and Oriented x 3. pleasant and cooperative. General Notes: Upon inspection patient's wounds  actually showed signs of good granulation there did not appear to be any issues in that regard. However he has one area where there is significant drainage unfortunately at this point which I think is a major part of the issue. That is keeping everything much too macerated which  in turn is slowing down any chance of healing. He does need stronger compression but again he also really probably needs venous testing to see if there is anything that could be done from an ablation standpoint to help him out as well. Integumentary (Hair, Skin) Wound #10 status is Open. Original cause of wound was Gradually Appeared. The wound is located on the Left,Lateral Lower Leg. The wound measures 1cm length x 0.5cm width x 0.1cm depth; 0.393cm^2 area and 0.039cm^3 volume. There is Fat Layer (Subcutaneous Tissue) Exposed exposed. There is no tunneling or undermining noted. There is a large amount of serous drainage noted. The wound margin is flat and intact. There is large (67-100%) pink granulation within the wound bed. There is a small (1-33%) amount of necrotic tissue within the wound bed including Adherent Slough. General Notes: copious drainage Wound #9 status is Open. Original cause of wound was Gradually Appeared. The wound is located on the Left,Medial Lower Leg. The wound measures 1cm length x 1cm width x 0.1cm depth; 0.785cm^2 area and 0.079cm^3 volume. There is Fat Layer (Subcutaneous Tissue) Exposed exposed. There is no tunneling or undermining noted. There is a large amount of serous drainage noted. The wound margin is flat and intact. There is large (67-100%) pink granulation within the wound bed. There is a small (1-33%) amount of necrotic tissue within the wound bed including Adherent Slough. General Notes: copious drainage Assessment Active Problems ICD-10 Lymphedema, not elsewhere classified Non-pressure chronic ulcer of other part of right lower leg with fat layer exposed Non-pressure chronic  ulcer of other part of left lower leg with fat layer exposed Type 2 diabetes mellitus with other skin ulcer Essential (primary) hypertension Procedures Wound #10 Pre-procedure diagnosis of Wound #10 is a Lymphedema located on the Left,Lateral Lower Leg . There was a Haematologist Compression Therapy Procedure by Army Melia, RN. Post procedure Diagnosis Wound #10: Same as Pre-Procedure Wound #9 Pre-procedure diagnosis of Wound #9 is a Lymphedema located on the Left,Medial Lower Leg . There was a Haematologist Compression Therapy Procedure by Army Melia, RN. Post procedure Diagnosis Wound #9: Same as Pre-Procedure Plan Wound Cleansing: Wound #10 Left,Lateral Lower Leg: Cleanse wound with mild soap and water Wound #9 Left,Medial Lower Leg: PERCIVAL, GLASHEEN ([id number]) Cleanse wound with mild soap and water Primary Wound Dressing: Wound #10 Left,Lateral Lower Leg: XtraSorb - over weeping areas Wound #9 Left,Medial Lower Leg: XtraSorb - over weeping areas Dressing Change Frequency: Wound #10 Left,Lateral Lower Leg: Change Dressing Monday, Wednesday, Friday - Monday in office, Wednesday and Friday by [facility] Wound #9 Left,Medial Lower Leg: Change Dressing Monday, Wednesday, Friday - Monday in office, Wednesday and Friday by [facility] Follow-up Appointments: Wound #10 Left,Lateral Lower Leg: Return Appointment in 1 week. Wound #9 Left,Medial Lower Leg: Return Appointment in 1 week. Edema Control: Wound #10 Left,Lateral Lower Leg: Unna Boots Bilaterally - ABD pads up shin for protection Wound #9 Left,Medial Lower Leg: Unna Boots Bilaterally - ABD pads up shin for protection Home Health: [place] Nurse may visit PRN to address patient s wound care needs. FACE TO FACE ENCOUNTER: MEDICARE and MEDICAID PATIENTS: I certify that this patient is under my care and that I had a face-to-face encounter that meets the physician face-to-face encounter requirements with  this patient on this date. The encounter with the patient was in whole or in part for the following MEDICAL CONDITION: (primary reason for Corey West) MEDICAL NECESSITY: I certify, that based  on my findings, NURSING services are a medically necessary home health service. HOME BOUND STATUS: I certify that my clinical findings support that this patient is homebound (i.e., Due to illness or injury, pt requires aid of supportive devices such as crutches, cane, wheelchairs, walkers, the use of special transportation or the assistance of another person to leave their place of residence. There is a normal inability to leave the home and doing so requires considerable and taxing effort. Other absences are for medical reasons / religious services and are infrequent or of short duration when for other reasons). If current dressing causes regression in wound condition, may D/C ordered dressing product/s and apply Normal Saline Moist Dressing daily until next [place] / Other MD appointment. [place] of regression in wound condition at [phone]. Please direct any NON-WOUND related issues/requests for orders to patient's Primary Care Physician Consults ordered were: Vascular - ABI TBI and reflux 1. I would recommend currently that we go ahead and initiate a continuation of the Unna boot wraps which are really the best we can do for the time being until we can get his arterial testing to see if he can support a more significant wrap. I really think he would do well with a 4-layer compression wrap to try to get the fluid out of this leg and slow down the draining. 2. I would recommend as well that for the time being we continue with ABD pads and Xtrasorb to help with catching excessive drainage. 3. I am also can recommend at this point that the patient needs to elevate his legs is much as possible. I really think he would do well to use his lymphedema pumps as well although  he tells me that it is really too painful right now from do this. 4. I am going to recommend that the patient go forward with the arterial study with ABI, TBI, and venous reflux testing at this point. I think he needs to do both test in order to evaluate for the appropriate treatment regimen going forward. We will see patient back for reevaluation in 1 week here in the clinic. If anything worsens or changes patient will contact our office for additional recommendations. Electronic Signature(s) Signed: 12/28/2019 1:31:38 PM By: Worthy Keeler PA-C Entered By: Worthy Keeler on 12/28/2019 13:31:37 Corey West ([id number]) -------------------------------------------------------------------------------- [facility] Details Patient Name: KALEEL, SCHMIEDER A. Date of Service: 12/28/2019 Medical Record Number: [id number] Patient Account Number: [id number] Date of Birth/Sex: 17-Nov-1950 (69 y.o. M) Treating RN: Army Melia Primary Care Provider: [name] Other Clinician: Referring Provider: [name] Treating Provider/Extender: Melburn Hake, HOYT Weeks in Treatment: 5 Diagnosis Coding ICD-10 Codes Code Description I89.0 Lymphedema, not elsewhere classified L97.812 Non-pressure chronic ulcer of other part of right lower leg with fat layer exposed L97.822 Non-pressure chronic ulcer of other part of left lower leg with fat layer exposed E11.622 Type 2 diabetes mellitus with other skin ulcer I10 Essential (primary) hypertension Facility Procedures CPT4 Code: 98338250 Description: (Facility Use Only) 29581LT - APPLY MULTLAY COMPRS LWR LT LEG Modifier: Quantity: 1 CPT4 Code: Description: ICD-10 Diagnosis Description I89.0 Lymphedema, not elsewhere classified Modifier: Quantity: Physician Procedures CPT4 Code: 5397673 Description: 99214 - WC PHYS LEVEL 4 - EST PT Modifier: Quantity: 1 CPT4 Code: Description: ICD-10 Diagnosis Description I89.0 Lymphedema, not elsewhere classified L97.812  Non-pressure chronic ulcer of other part of right lower leg with fat layer L97.822 Non-pressure chronic ulcer of other part of left lower leg with fat layer  e  U36.725 Type 2 diabetes mellitus with other skin ulcer Modifier: exposed xposed Quantity: Electronic Signature(s) Signed: 12/29/2019 2:38:39 PM By: Sharon Mt Signed: 12/31/2019 5:29:46 PM By: Worthy Keeler PA-C Previous Signature: 12/28/2019 1:31:58 PM Version By: Worthy Keeler PA-C Entered By: Sharon Mt on 12/29/2019 14:38:38

## 2019-12-28 NOTE — Progress Notes (Signed)
Corey West, Corey West ([id number]) Visit Report for 12/28/2019 Arrival Information Details Patient Name: Corey West, Corey West. Date of Service: 12/28/2019 12:45 PM Medical Record Number: [id number] Patient Account Number: [id number] Date of Birth/Sex: 01/18/1951 (69 y.o. M) Treating RN: Corey West Primary Care Corey West: Corey West Other Clinician: Referring Corey West: Corey West Treating Brylan Seubert/Extender: Corey West Corey West: 5 Visit Information History Since Last Visit Pain Present Now: No Patient Arrived: Corey West Arrival Time: 12:49 Accompanied By: self Transfer Assistance: None Patient Identification Verified: Yes Secondary Verification Process Completed: Yes Electronic Signature(s) Signed: 12/28/2019 4:19:28 PM By: Corey West Corey West Entered By: Corey West on 12/28/2019 12:51:43 Corey West ([id number]) -------------------------------------------------------------------------------- Compression Therapy Details Patient Name: Corey West A. Date of Service: 12/28/2019 12:45 PM Medical Record Number: [id number] Patient Account Number: [id number] Date of Birth/Sex: 03-Sep-1951 (69 y.o. M) Treating RN: Corey West Primary Care Eirene Rather: Corey West Other Clinician: Referring Rorik Vespa: Harrel West Treating Dylan Monforte/Extender: Corey West Corey West: 5 Compression Therapy Performed for Wound Assessment: Wound #10 Left,Lateral Lower Leg Performed By: Clinician Corey Melia, RN Compression Type: Rolena Infante Post Procedure Diagnosis Same as Pre-procedure Electronic Signature(s) Signed: 12/28/2019 4:04:00 PM By: Corey West Entered By: Corey West on 12/28/2019 13:14:01 Corey West ([id number]) -------------------------------------------------------------------------------- Compression Therapy Details Patient Name: Corey Cower A. Date of Service: 12/28/2019 12:45 PM Medical Record Number: [id number] Patient Account  Number: [id number] Date of Birth/Sex: Jan 06, 1951 (69 y.o. M) Treating RN: Corey West Primary Care Khelani Kops: Corey West Other Clinician: Referring Marlow Berenguer: Corey West Treating Jenner Rosier/Extender: Corey West Corey West: 5 Compression Therapy Performed for Wound Assessment: Wound #9 Left,Medial Lower Leg Performed By: Clinician Corey Melia, RN Compression Type: Rolena Infante Post Procedure Diagnosis Same as Pre-procedure Electronic Signature(s) Signed: 12/28/2019 4:04:00 PM By: Corey West Entered By: Corey West on 12/28/2019 13:14:01 Corey West ([id number]) -------------------------------------------------------------------------------- Encounter Discharge Information Details Patient Name: Corey West, Corey A. Date of Service: 12/28/2019 12:45 PM Medical Record Number: [id number] Patient Account Number: [id number] Date of Birth/Sex: 03-27-1951 (69 y.o. M) Treating RN: Corey West Primary Care Daysha Ashmore: Corey West Other Clinician: Referring [place] Nolton: Corey West Treating Hermenia Fritcher/Extender: Corey West Corey West: 5 Encounter Discharge Information Items Discharge Condition: Stable Ambulatory Status: Corey West Discharge Destination: Home Transportation: Private Auto Accompanied By: self Schedule Follow-up Appointment: Yes Clinical Summary of Care: Electronic Signature(s) Signed: 12/28/2019 4:04:00 PM By: Corey West Entered By: Corey West on 12/28/2019 13:23:10 Corey West ([id number]) -------------------------------------------------------------------------------- Lower Extremity Assessment Details Patient Name: Corey West, Corey A. Date of Service: 12/28/2019 12:45 PM Medical Record Number: [id number] Patient Account Number: [id number] Date of Birth/Sex: [birth year] (69 y.o. M) Treating RN: Montey Hora Primary Care Jamille Fisher: Corey West Other Clinician: Referring Adarius Tigges: Corey West Treating Teniya Filter/Extender: Corey West Corey in  West: 5 Edema Assessment Assessed: [Left: No] [Right: No] Edema: [Left: Yes] [Right: Yes] Calf Left: Right: Point of Measurement: 32 cm From Medial Instep 40.5 cm 44.5 cm Ankle Left: Right: Point of Measurement: 12 cm From Medial Instep 29.5 cm 30 cm Vascular Assessment Pulses: Dorsalis Pedis Palpable: [Left:Yes] [Right:Yes] Electronic Signature(s) Signed: 12/28/2019 4:17:26 PM By: Montey Hora Entered By: Montey Hora on 12/28/2019 13:00:38 Corey West ([id number]) -------------------------------------------------------------------------------- Multi Wound Chart Details Patient Name: Corey Cower A. Date of Service: 12/28/2019 12:45 PM Medical Record Number: [id number] Patient Account Number: [id number] Date of Birth/Sex: 08-Oct-1950 (69 y.o. M) Treating RN: Corey West Primary Care Yoceline Bazar: Corey West Other Clinician: Referring Lynn Sissel: Corey West Treating Calil Amor/Extender: Corey West Corey  in West: 5 Vital Signs Height(in): 72 Pulse(bpm): 80 Weight(lbs): 318 Blood Pressure(mmHg): 174/62 Body Mass Index(BMI): 43 Temperature(F): 98.0 Respiratory Rate(breaths/min): 18 Photos: [N/A:N/A] Wound Location: Left Lower Leg - Lateral Left Lower Leg - Medial N/A Wounding Event: Gradually Appeared Gradually Appeared N/A Primary Etiology: Lymphedema Lymphedema N/A Comorbid History: Cataracts, Anemia, Sleep Apnea, Cataracts, Anemia, Sleep Apnea, N/A Hypertension, Cirrhosis , Type II Hypertension, Cirrhosis , Type II Diabetes Diabetes Date Acquired: 12/21/2019 12/21/2019 N/A Corey of West: 1 1 N/A Wound Status: Open Open N/A Measurements L x W x D (cm) 1x0.5x0.1 1x1x0.1 N/A Area (cm) : 0.393 0.785 N/A Volume (cm) : 0.039 0.079 N/A % Reduction in Area: 37.40% -51.50% N/A % Reduction in Volume: 38.10% -51.90% N/A Classification: Full Thickness Without Exposed Full Thickness Without Exposed N/A Support Structures Support Structures Exudate  Amount: Large Large N/A Exudate Type: Serous Serous N/A Exudate Color: amber amber N/A Wound Margin: Flat and Intact Flat and Intact N/A Granulation Amount: Large (67-100%) Large (67-100%) N/A Granulation Quality: Pink Pink N/A Necrotic Amount: Small (1-33%) Small (1-33%) N/A Exposed Structures: Fat Layer (Subcutaneous Tissue) Fat Layer (Subcutaneous Tissue) N/A Exposed: Yes Exposed: Yes Fascia: No Fascia: No Tendon: No Tendon: No Muscle: No Muscle: No Joint: No Joint: No Bone: No Bone: No Epithelialization: Small (1-33%) Small (1-33%) N/A Assessment Notes: copious drainage copious drainage N/A West Notes Electronic Signature(s) Signed: 12/28/2019 4:04:00 PM By: Verdis Prime, Linus Mako ([id number]) Entered By: Corey West on 12/28/2019 13:13:12 Corey West ([id number]) -------------------------------------------------------------------------------- Multi-Disciplinary Care Plan Details Patient Name: Corey West, Corey West A. Date of Service: 12/28/2019 12:45 PM Medical Record Number: [id number] Patient Account Number: [id number] Date of Birth/Sex: [birth year] (69 y.o. M) Treating RN: Corey West Primary Care Onnie Hatchel: Corey West Other Clinician: Referring Chanae Gemma: Corey West Treating Tiaja Hagan/Extender: Corey West Corey West: 5 Active Inactive Nutrition Nursing Diagnoses: Impaired glucose control: actual or potential Goals: Patient/caregiver verbalizes understanding of need to maintain therapeutic glucose control per primary care physician Date Initiated: 11/23/2019 Target Resolution Date: 12/11/2019 Goal Status: Active Interventions: Assess patient nutrition upon admission and as needed per policy Notes: Orientation to the Wound Care Program Nursing Diagnoses: Knowledge deficit related to the wound healing center program Goals: Patient/caregiver will verbalize understanding of the [facility] Date Initiated: 11/23/2019 Target  Resolution Date: 12/11/2019 Goal Status: Active Interventions: Provide education on orientation to the wound center Notes: Wound/Skin Impairment Nursing Diagnoses: Impaired tissue integrity Goals: Ulcer/skin breakdown will have a volume reduction of 30% by week 4 Date Initiated: 11/23/2019 Target Resolution Date: 12/11/2019 Goal Status: Active Interventions: Assess ulceration(s) every visit Notes: Electronic Signature(s) Signed: 12/28/2019 4:04:00 PM By: Corey West Entered By: Corey West on 12/28/2019 13:12:52 Corey West ([id number]) [address], Linus Mako ([id number]) -------------------------------------------------------------------------------- Pain Assessment Details Patient Name: Corey Cower A. Date of Service: 12/28/2019 12:45 PM Medical Record Number: [id number] Patient Account Number: [id number] Date of Birth/Sex: 1951-05-12 (69 y.o. M) Treating RN: Montey Hora Primary Care Jasmon Graffam: Corey West Other Clinician: Referring Leather Estis: Corey West Treating Malaisha Silliman/Extender: Corey West Corey West: 5 Active Problems Location of Pain Severity and Description of Pain Patient Has Paino No Site Locations Pain Management and Medication Current Pain Management: Electronic Signature(s) Signed: 12/28/2019 4:17:26 PM By: Montey Hora Entered By: Montey Hora on 12/28/2019 12:57:37 Corey West ([id number]) -------------------------------------------------------------------------------- Patient/Caregiver Education Details Patient Name: Corey Cower A. Date of Service: 12/28/2019 12:45 PM Medical Record Number: [id number] Patient Account Number: [id number] Date of Birth/Gender: 09-22-1951 (69 y.o. M) Treating RN: Corey West  Primary Care Physician: Corey West Other Clinician: Referring Physician: Harrel West Treating Physician/Extender: Sharalyn Ink in West: 5 Education Assessment Education Provided To: Patient Education Topics  Provided Wound/Skin Impairment: Handouts: Caring for Your Ulcer Methods: Demonstration, Explain/Verbal Responses: State content correctly Electronic Signature(s) Signed: 12/28/2019 4:04:00 PM By: Corey West Entered By: Corey West on 12/28/2019 13:22:20 Corey West ([id number]) -------------------------------------------------------------------------------- Wound Assessment Details Patient Name: Corey Cower A. Date of Service: 12/28/2019 12:45 PM Medical Record Number: [id number] Patient Account Number: [id number] Date of Birth/Sex: 1951-06-04 (69 y.o. M) Treating RN: Montey Hora Primary Care Jp Eastham: Corey West Other Clinician: Referring Hensley Aziz: Corey West Treating Nadege Carriger/Extender: Corey West Corey West: 5 Wound Status Wound Number: 10 Primary Lymphedema Etiology: Wound Location: Left Lower Leg - Lateral Wound Status: Open Wounding Event: Gradually Appeared Comorbid Cataracts, Anemia, Sleep Apnea, Hypertension, Date Acquired: 12/21/2019 History: Cirrhosis , Type II Diabetes Corey Of West: 1 Clustered Wound: No Photos Wound Measurements Length: (cm) 1 Width: (cm) 0.5 Depth: (cm) 0.1 Area: (cm) 0.393 Volume: (cm) 0.039 % Reduction in Area: 37.4% % Reduction in Volume: 38.1% Epithelialization: Small (1-33%) Tunneling: No Undermining: No Wound Description Classification: Full Thickness Without Exposed Support Structures Wound Margin: Flat and Intact Exudate Amount: Large Exudate Type: Serous Exudate Color: amber Foul Odor After Cleansing: No Slough/Fibrino Yes Wound Bed Granulation Amount: Large (67-100%) Exposed Structure Granulation Quality: Pink Fascia Exposed: No Necrotic Amount: Small (1-33%) Fat Layer (Subcutaneous Tissue) Exposed: Yes Necrotic Quality: Adherent Slough Tendon Exposed: No Muscle Exposed: No Joint Exposed: No Bone Exposed: No Assessment Notes copious drainage West Notes Wound #10 (Left, Lateral  Lower Leg) Notes xsorb, unna boots bilateral Corey West, Corey West ([id number]) Electronic Signature(s) Signed: 12/28/2019 4:17:26 PM By: Montey Hora Entered By: Montey Hora on 12/28/2019 13:05:42 Corey West ([id number]) -------------------------------------------------------------------------------- Wound Assessment Details Patient Name: Corey Cower A. Date of Service: 12/28/2019 12:45 PM Medical Record Number: [id number] Patient Account Number: [id number] Date of Birth/Sex: 23-Sep-1951 (69 y.o. M) Treating RN: Montey Hora Primary Care Montay Vanvoorhis: Corey West Other Clinician: Referring Aurora Rody: Corey West Treating Nataliee Shurtz/Extender: Corey West Corey West: 5 Wound Status Wound Number: 9 Primary Lymphedema Etiology: Wound Location: Left Lower Leg - Medial Wound Status: Open Wounding Event: Gradually Appeared Comorbid Cataracts, Anemia, Sleep Apnea, Hypertension, Date Acquired: 12/21/2019 History: Cirrhosis , Type II Diabetes Corey Of West: 1 Clustered Wound: No Photos Wound Measurements Length: (cm) 1 Width: (cm) 1 Depth: (cm) 0.1 Area: (cm) 0.785 Volume: (cm) 0.079 % Reduction in Area: -51.5% % Reduction in Volume: -51.9% Epithelialization: Small (1-33%) Tunneling: No Undermining: No Wound Description Classification: Full Thickness Without Exposed Support Structures Wound Margin: Flat and Intact Exudate Amount: Large Exudate Type: Serous Exudate Color: amber Foul Odor After Cleansing: No Slough/Fibrino Yes Wound Bed Granulation Amount: Large (67-100%) Exposed Structure Granulation Quality: Pink Fascia Exposed: No Necrotic Amount: Small (1-33%) Fat Layer (Subcutaneous Tissue) Exposed: Yes Necrotic Quality: Adherent Slough Tendon Exposed: No Muscle Exposed: No Joint Exposed: No Bone Exposed: No Assessment Notes copious drainage West Notes Wound #9 (Left, Medial Lower Leg) Notes xsorb, unna boots bilateral Corey West, Corey West  ([id number]) Electronic Signature(s) Signed: 12/28/2019 4:17:26 PM By: Montey Hora Entered By: Montey Hora on 12/28/2019 [place], [place] ([id number]) -------------------------------------------------------------------------------- [place] Details Patient Name: Corey Cower A. Date of Service: 12/28/2019 12:45 PM Medical Record Number: [id number] Patient Account Number: [id number] Date of Birth/Sex: 02-Jul-1951 (69 y.o. M) Treating RN: Corey West Primary Care Kourtnie Sachs: Corey West Other Clinician: Referring Ohanna Gassert: Corey West Treating Witt Plitt/Extender:  STONE III, West Corey West: 5 Vital Signs Time Taken: 12:50 Temperature (F): 98.0 Height (in): 72 Pulse (bpm): 80 Weight (lbs): 318 Respiratory Rate (breaths/min): 18 Body Mass Index (BMI): 43.1 Blood Pressure (mmHg): 174/62 Reference Range: 80 - 120 mg / dl Electronic Signature(s) Signed: 12/28/2019 4:19:28 PM By: Corey West Corey West Entered By: Corey West on 12/28/2019 12:54:19

## 2020-01-04 ENCOUNTER — Other Ambulatory Visit (INDEPENDENT_AMBULATORY_CARE_PROVIDER_SITE_OTHER): Payer: Self-pay | Admitting: Internal Medicine

## 2020-01-04 ENCOUNTER — Ambulatory Visit: Payer: Medicare Other | Admitting: Physician Assistant

## 2020-01-04 ENCOUNTER — Other Ambulatory Visit (INDEPENDENT_AMBULATORY_CARE_PROVIDER_SITE_OTHER): Payer: Self-pay | Admitting: Physician Assistant

## 2020-01-04 DIAGNOSIS — L97812 Non-pressure chronic ulcer of other part of right lower leg with fat layer exposed: Secondary | ICD-10-CM

## 2020-01-04 DIAGNOSIS — L97919 Non-pressure chronic ulcer of unspecified part of right lower leg with unspecified severity: Secondary | ICD-10-CM

## 2020-01-04 DIAGNOSIS — I890 Lymphedema, not elsewhere classified: Secondary | ICD-10-CM

## 2020-01-04 DIAGNOSIS — L97929 Non-pressure chronic ulcer of unspecified part of left lower leg with unspecified severity: Secondary | ICD-10-CM

## 2020-01-05 ENCOUNTER — Other Ambulatory Visit
Admission: RE | Admit: 2020-01-05 | Discharge: 2020-01-05 | Disposition: A | Discharge status: Home / Self Care | Payer: Medicare Other | Source: Ambulatory Visit | Attending: Internal Medicine | Admitting: Internal Medicine

## 2020-01-05 DIAGNOSIS — K7469 Other cirrhosis of liver: Secondary | ICD-10-CM | POA: Diagnosis present

## 2020-01-05 LAB — AMMONIA: Ammonia: 39 umol/L — ABNORMAL HIGH (ref 9–35)

## 2020-01-06 ENCOUNTER — Ambulatory Visit (INDEPENDENT_AMBULATORY_CARE_PROVIDER_SITE_OTHER): Payer: Medicare Other

## 2020-01-06 ENCOUNTER — Other Ambulatory Visit: Payer: Self-pay

## 2020-01-06 ENCOUNTER — Encounter (INDEPENDENT_AMBULATORY_CARE_PROVIDER_SITE_OTHER): Payer: Medicare Other

## 2020-01-06 DIAGNOSIS — L97812 Non-pressure chronic ulcer of other part of right lower leg with fat layer exposed: Secondary | ICD-10-CM

## 2020-01-06 DIAGNOSIS — I890 Lymphedema, not elsewhere classified: Secondary | ICD-10-CM

## 2020-01-11 ENCOUNTER — Other Ambulatory Visit: Payer: Self-pay

## 2020-01-11 ENCOUNTER — Encounter: Payer: Medicare Other | Attending: Physician Assistant | Admitting: Physician Assistant

## 2020-01-11 DIAGNOSIS — K7460 Unspecified cirrhosis of liver: Secondary | ICD-10-CM | POA: Insufficient documentation

## 2020-01-11 DIAGNOSIS — K2270 Barrett's esophagus without dysplasia: Secondary | ICD-10-CM | POA: Diagnosis not present

## 2020-01-11 DIAGNOSIS — I509 Heart failure, unspecified: Secondary | ICD-10-CM | POA: Insufficient documentation

## 2020-01-11 DIAGNOSIS — L97822 Non-pressure chronic ulcer of other part of left lower leg with fat layer exposed: Secondary | ICD-10-CM | POA: Diagnosis not present

## 2020-01-11 DIAGNOSIS — Z6841 Body Mass Index (BMI) 40.0 and over, adult: Secondary | ICD-10-CM | POA: Diagnosis not present

## 2020-01-11 DIAGNOSIS — X58XXXA Exposure to other specified factors, initial encounter: Secondary | ICD-10-CM | POA: Insufficient documentation

## 2020-01-11 DIAGNOSIS — I872 Venous insufficiency (chronic) (peripheral): Secondary | ICD-10-CM | POA: Insufficient documentation

## 2020-01-11 DIAGNOSIS — D631 Anemia in chronic kidney disease: Secondary | ICD-10-CM | POA: Insufficient documentation

## 2020-01-11 DIAGNOSIS — I130 Hypertensive heart and chronic kidney disease with heart failure and stage 1 through stage 4 chronic kidney disease, or unspecified chronic kidney disease: Secondary | ICD-10-CM | POA: Insufficient documentation

## 2020-01-11 DIAGNOSIS — E11622 Type 2 diabetes mellitus with other skin ulcer: Secondary | ICD-10-CM | POA: Diagnosis present

## 2020-01-11 DIAGNOSIS — E1122 Type 2 diabetes mellitus with diabetic chronic kidney disease: Secondary | ICD-10-CM | POA: Diagnosis not present

## 2020-01-11 DIAGNOSIS — L97812 Non-pressure chronic ulcer of other part of right lower leg with fat layer exposed: Secondary | ICD-10-CM | POA: Insufficient documentation

## 2020-01-11 DIAGNOSIS — L03116 Cellulitis of left lower limb: Secondary | ICD-10-CM | POA: Diagnosis not present

## 2020-01-11 DIAGNOSIS — G4733 Obstructive sleep apnea (adult) (pediatric): Secondary | ICD-10-CM | POA: Insufficient documentation

## 2020-01-11 DIAGNOSIS — Z794 Long term (current) use of insulin: Secondary | ICD-10-CM | POA: Diagnosis not present

## 2020-01-11 DIAGNOSIS — N189 Chronic kidney disease, unspecified: Secondary | ICD-10-CM | POA: Insufficient documentation

## 2020-01-11 DIAGNOSIS — E1151 Type 2 diabetes mellitus with diabetic peripheral angiopathy without gangrene: Secondary | ICD-10-CM | POA: Insufficient documentation

## 2020-01-11 DIAGNOSIS — S99911A Unspecified injury of right ankle, initial encounter: Secondary | ICD-10-CM | POA: Diagnosis not present

## 2020-01-11 DIAGNOSIS — F17290 Nicotine dependence, other tobacco product, uncomplicated: Secondary | ICD-10-CM | POA: Diagnosis not present

## 2020-01-11 DIAGNOSIS — I890 Lymphedema, not elsewhere classified: Secondary | ICD-10-CM | POA: Diagnosis not present

## 2020-01-11 NOTE — Progress Notes (Addendum)
JUWAN, VENCES ([id number]) Visit Report for 01/11/2020 Chief Complaint Document Details Patient Name: Corey West, Corey West. Date of Service: 01/11/2020 12:30 PM Medical Record Number: [id number] Patient Account Number: [id number] Date of Birth/Sex: 08-01-1951 (69 y.o. M) Treating RN: Army Melia Primary Care Provider: [name] Other Clinician: Referring Provider: [name] Treating Provider/Extender: Melburn Hake, Nanami Whitelaw Weeks in Treatment: 7 Information Obtained from: Patient Chief Complaint Bilateral LE Ulcers Electronic Signature(s) Signed: 01/11/2020 12:59:30 PM By: Worthy Keeler PA-C Entered By: Worthy Keeler on 01/11/2020 12:59:29 Corey West ([id number]) -------------------------------------------------------------------------------- HPI Details Patient Name: Corey West Date of Service: 01/11/2020 12:30 PM Medical Record Number: [id number] Patient Account Number: [id number] Date of Birth/Sex: 21-Oct-1950 (69 y.o. M) Treating RN: Army Melia Primary Care Provider: [name] Other Clinician: Referring Provider: [name] Treating Provider/Extender: Melburn Hake, Natasha Paulson Weeks in Treatment: 7 History of Present Illness HPI Description: Pleasant 69 year old with West past medical history significant for morbid obesity, insulin-dependent type 2 diabetes (hemoglobin A1c 7.2 in January 2015), cirrhosis, and chronic kidney disease. He reports West left anterior calf ulcer since January 2015. He has had venous stasis ulcers in the past, which have healed spontaneously. No history of DVT. Does not regularly wear compression. Ambulating normally per his baseline. Recently completed West course of antibiotics. Treated with West Profore light and Coban 2 layer compression drainage, which both fell down and he cut off. Tolerated Coban lite compression bandage. Returns to clinic for followup. No complaints today. No pain at this time. No fever or chills. No open wounds at this time. He is having  difficulty finding compression garments that fit him. 04/23/17; this is now 69 year old man who returns to our clinic for nonhealing wounds on the right lateral and right posterior calf. He tells me that they have been present for 6 months. In the setting of severe right greater than left lower extremity lymphedema likely secondary to chronic venous insufficiency. He has been followed in the lymphedema clinic at least since early June with compression wraps although these have not really helped and the patient is here for our review of this situation. The patient has had problems with edema in his legs for years. He has not worn compression. He is type II diabetic that sounds poorly controlled. He also has obstructive sleep apnea and cirrhosis. He is occasional cigar smoker 04/30/17; patient is West 69 year old man who has severe bilateral lower extremity lymphedema stage III since his early 33s he tells me. This is no doubt secondary to chronic venous insufficiency. He was going to the lymphedema clinic who forwarded him here due to skin breakdown in the right leg. He has not previously worn compression stockings. He has West large area of denuded epithelium on the posterior leg. He has West more open wound on the right lateral leg 05/07/17 on evaluation today patient appears to be doing about the same his wounds are showing signs of improvement and looking fairly well. With that being said he is not having any severe discomfort just some mild aching due to the swelling and really this is worse on the left than the right and the right side is where he actually has his wounds. He has been tolerating the [employer] wraps without complication and states this is actually much better than the wraps that he was getting at the lymphedema clinic most recently. He is happy with these wraps. He has no nausea, vomiting, diarrhea and no fevers or chills. We are still waiting to hear  back in regard to the lymphedema pumps  which I do think would be beneficial for him even with the compression wraps he still has West significant amount of edema noted of the bilateral lower extremities. 05/15/17 on evaluation today patient's right lower extremity edema and weeping especially the posterior portion appears to be doing better. He still is having some issue here but fortunately it's minimal compared to previous weeks. He continues to tolerate the compression fairly well. Overall he is pleased with how things are progressing. No fevers, chills, nausea, or vomiting noted at this time. And patient is having only minimal discomfort. 05/22/17; the medial wound on the right leg actually looks considerably better and is epithelialized. He still has West small punched out area on the right lateral leg. Edema control is marginal end of the [employer]. We are working on getting him compression pumps 05/29/17; the patient's lymphedema pumps are apparently arriving tonight. In the meantime he took his Unna boots off last Sunday because the edema had progressed to the point that West word to titrate. Since then he has put nothing on his right leg and more specifically nothing on the right medial and lateral wounds 06/05/17; the patient has his lymphedema pumps and he is using them once West day on the right leg. There is West sizable reduction in the circumference of his leg although he arrived in clinic today somewhat short of breath. 07/02/17; since the patient was last in clinic he was admitted on 2 occasions. He was admitted from 9/12 through 9/13 with lactic acidosis weakness and felt to have left lower extremity cellulitis. Due to the elevated lactic acid level he was felt to have sepsis although this was ruled out and the lactic acidosis was felt secondary to his chronic cirrhosis. He was initially treated with IV vancomycin for cellulitis and redness of his left leg but then this was subsequently recognized to be secondary to chronic venous stasis and  chronic lymphedema. When he left the hospital he had Unna boots placed bilaterally. He was readmitted on 9/26 through 9/28 with GI bleeding. His EGD showed Barrett's esophagus and gastritis. He was discharged with West hemoglobin of 8 to follow-up with GI as an outpatient. There was nothing more about his lower extremities during this short hospital stay. The patient has not been using his external compression pumps fearing fluid overload and shortness of breath although he feels better now that his hemoglobin is stabilized. He continues to have epithelial loss on his bilateral lower extremities. 07/09/17; patient has West large but superficial area on the right posterior leg and on the left anterior leg. He tells me he is been using his external compression pumps once West day for the required hour. He states the pumps hurt his right ankle. 07/17/17; patient was transfused West further 2 units since he was here the last time. His hemoglobin was once again found to be low. He has not been able to use his external compression pumps because of West combination of prolonged power outage in his home and I think issues with his other medical care. His wounds have actually worsened he now has loss of epithelialization all the way from the right lateral to the right medial posteriorly. The left side which has anterior loss of epithelialization also appears to be worse on the lateral aspect of the leg where there is denuded epithelium now as well. Leaking edema fluid. He has bilateral lymphedema. 08/12/2017 -- the patient has not been here for  25 days and has had the same compression wraps which have not been changed!! He says he was being worked up for West GI bleed and had some transfusions since he was seen last by Dr. Dellia Nims 08/20/17; mostly by review of what the patient is saying and West quick glance at his records in care everywhere I think this patient probably has Davaris, Youtsey, Ledbetter West. ([id number]) cirrhosis and portal  hypertension. He is bleeding I think from gastric area and he is requiring recurrent transfusions. He has been told that there is nothing more that can be done and that his prognosis is poor. He arrived in our office in our office in tears. We've been using silver alginate and 3 layer compression to his bilateral lower extremity wounds. He has significant lymphedema likely secondary to chronic venous insufficiency. He has external compression pumps at home that he had actually before he came to this clinic but he is not using 08/27/17; patient is doing well, wounds on his left leg are tiny one anteriorly and one laterally. He still has substantial wound on the posterior aspect of his right calf extending laterally for over this looks better than last time. He tells me he is able to use his compression pumps once West day on most days. He has his hemoglobin checked on Friday 09-19-17 He arrives today for follow up evaluation. He was unable to come to the clinic last week r/t weather conditions, therefore compression has been in place for two weeks. He presents today with healed LLE and one partial thickness open area to the posterior RL. He states he is experiencing diarrhea secondary to antibiotic that will be stopped next week and has been unable to use lymphedema pumps. Will continue with compression bilaterally, follow up next week with plans to discharge and transition to lymphedema pumps 10/02/17; the patient arrives today with his juxta light stockings bilaterally. He also has external compression pumps at home. There are no open areas on his bilateral legs. He tells me he has had adjustments in his diuretics and had resultant hypokalemia but the amount of edema in his legs is West lot better although he stillhas changes of lymphedema 10/16/17; the patient has been using his juxta lites although they are not put on as high as we might like. He has very superficial excoriations at the top of his wraps but  I don't think that anything to keep him in clinic here. Severe bilateral chronic venous insufficiency with secondary lymphedema. He has no open wound areas Readmission: 11/23/2019 patient presents today for readmission he was last seen here in the clinic on 10/16/2017. With that being said he is unfortunately continue to have issues with lower extremity edema though he was using his lymphedema pumps on West fairly regular basis. Fortunately there is no signs of infection at this time which is good news there is West little bit of erythema that have me wondering about the possibility of infection but again I think this may just be secondary to the inflammation from the lymphedema and the current weeping causing skin breakdown as well. His plantar aspect of his foot on the left lower extremity actually is sloughing off West lot of callus skin externally there is nothing open under this area but nonetheless it does seem to be causing him some difficulty. Fortunately there is no signs of active infection at this time. No fevers, chills, nausea, vomiting, or diarrhea. The patient does have West history of diabetes mellitus type 2, hypertension,  and lymphedema. 12/07/2019 upon evaluation today patient appears to be doing well with regard to the overall appearance of his lower extremities currently. He has been tolerating the dressing changes without complication. Fortunately his infection seems to be doing much better at this point. He has made excellent progress. With that being said there are no signs of active infection systemically at this point. 12/21/2019 upon evaluation today patient appears to be doing slightly better with regard to his lower extremities. Some of the areas are definitely improving from the standpoint of the size of any open/weeping areas. He still has significant lymphedema. He has been in the hospital and apparently was admitted from 12/14/2019 through 12/17/2019. Subsequently he was diagnosed with  cellulitis of the lower extremity along with acute renal failure and he went in initially due to the fact that he was having issues with breathing. He does have congestive heart failure and has had West lot of issues with fluid here. Currently has BNP was 266 on 11/27/2019 and then subsequently at discharge on 12/17/2019 was 256. Looking through his sepsis lab work it appears that he was septic and that he was placed on Cipro 500 mg West day for 10 days secondary to West Klebsiella oxytoca bacteremia. The patient has had West DVT study but still has not had West arterial study with ABI and TBI we probably need to go ahead and see about making this referral back to vascular as we still do not really have West definitive answer at this point. Managed with regard to what his arterial flow is into the lower extremity. 12/28/2019 upon evaluation today patient unfortunately continues to have significant issues with weeping from his left lower extremity. Unfortunately he continues to experience significant issues here with the wraps becoming increasingly saturated. Unfortunately this seems to be related to one area on the anterior/medial portion of his left lower extremity where this is draining significantly and constantly without any signs of slowing. There are couple other open spots on his leg but nothing that is leaking in the way that this is. His heel is severely macerated secondary to the drainage. Home health is changing his wraps on Wednesday and Friday and were doing this on Mondays. Unfortunately I think he really needs West stronger compression wrap but at the moment are really not able to do this for him as were still waiting vascular testing. 01/11/2020 upon evaluation today patient actually appears to be doing significantly better with regard to his bilateral lower extremities with regard to some of the weeping. Nonetheless his right leg does have West lot of blisters as it was not rewrapped after his testing had  vascular. Fortunately there is no signs of active infection at this time. No fevers, chills, nausea, vomiting, or diarrhea. His vascular studies did reveal that he has good arterial flow noted bilaterally there is no signs of obstruction at this point. Overall I am very pleased with how things stand in that regard. Electronic Signature(s) Signed: 01/11/2020 1:24:39 PM By: Worthy Keeler PA-C Entered By: Worthy Keeler on 01/11/2020 13:24:38 Corey West ([id number]) -------------------------------------------------------------------------------- Physical Exam Details Patient Name: Corey West, Corey West. Date of Service: 01/11/2020 12:30 PM Medical Record Number: [id number] Patient Account Number: [id number] Date of Birth/Sex: 1951-01-21 (69 y.o. M) Treating RN: Army Melia Primary Care Provider: [name] Other Clinician: Referring Provider: [name] Treating Provider/Extender: Melburn Hake, Saw Mendenhall Weeks in Treatment: 7 Constitutional Well-nourished and well-hydrated in no acute distress. Respiratory normal breathing without difficulty. Psychiatric this  patient is able to make decisions and demonstrates good insight into disease process. Alert and Oriented x 3. pleasant and cooperative. Notes Upon inspection patient's wound bed actually showed signs of good granulation at this time. Fortunately there is no evidence of active infection and overall I am actually very pleased with how things seem to be progressing. I do think we can increase to 4 layer compression wrap which will better compress him hopefully this should help clear up the blistering as well as the weeping. Electronic Signature(s) Signed: 01/11/2020 1:24:58 PM By: Worthy Keeler PA-C Entered By: Worthy Keeler on 01/11/2020 13:24:58 Corey West ([id number]) -------------------------------------------------------------------------------- Physician Orders Details Patient Name: Corey West, Corey West. Date of Service: 01/11/2020  12:30 PM Medical Record Number: [id number] Patient Account Number: [id number] Date of Birth/Sex: 1951-06-13 (69 y.o. M) Treating RN: Army Melia Primary Care Provider: [name] Other Clinician: Referring Provider: [name] Treating Provider/Extender: Melburn Hake, Javaeh Muscatello Weeks in Treatment: 7 Verbal / Phone Orders: No Diagnosis Coding ICD-10 Coding Code Description I89.0 Lymphedema, not elsewhere classified L97.812 Non-pressure chronic ulcer of other part of right lower leg with fat layer exposed L97.822 Non-pressure chronic ulcer of other part of left lower leg with fat layer exposed E11.622 Type 2 diabetes mellitus with other skin ulcer I10 Essential (primary) hypertension Wound Cleansing Wound #10 Left,Lateral Lower Leg o Cleanse wound with mild soap and water Wound #9 Left,Medial Lower Leg o Cleanse wound with mild soap and water Secondary Dressing o XtraSorb - over leaking areas Dressing Change Frequency Wound #10 Left,Lateral Lower Leg o Dressing is to be changed Monday and Thursday. Wound #9 Left,Medial Lower Leg o Dressing is to be changed Monday and Thursday. Follow-up Appointments Wound #10 Left,Lateral Lower Leg o Return Appointment in 2 weeks. - HH to change wraps twice next week until next appointment Wound #9 Left,Medial Lower Leg o Return Appointment in 2 weeks. - HH to change wraps twice next week until next appointment Edema Control Wound #10 Left,Lateral Lower Leg o 4 Layer Compression System - Bilateral Wound #9 Left,Medial Lower Leg o 4 Layer Compression System - Bilateral Home Health Wound #10 Left,Lateral Lower Leg o Continue Home Health Visits - [place] Nurse may visit PRN to address patientos wound care needs. o FACE TO FACE ENCOUNTER: MEDICARE and MEDICAID PATIENTS: I certify that this patient is under my care and that I had West face-to- face encounter that meets the physician face-to-face encounter  requirements with this patient on this date. The encounter with the patient was in whole or in part for the following MEDICAL CONDITION: (primary reason for [place]) MEDICAL NECESSITY: I certify, that based on my findings, NURSING services are West medically necessary home health service. HOME BOUND STATUS: I certify that my clinical findings support that this patient is homebound (i.e., Due to illness or injury, pt requires aid of supportive devices such as crutches, cane, wheelchairs, walkers, the use of special transportation or the assistance of another person to leave their place of residence. There is West normal inability to leave the home and doing so requires considerable and taxing effort. Other absences are for medical reasons / religious services and are infrequent or of short duration when for other reasons). BODEE, LAFOE ([id number]) o If current dressing causes regression in wound condition, may D/C ordered dressing product/s and apply Normal Saline Moist Dressing daily until next [place] / Other MD appointment. [place] of regression in wound condition at  [phone]. o Please direct any NON-WOUND related issues/requests for orders to patient's Primary Care Physician Wound #9 Left,Medial Lower Leg o [place] Visits - [facility] Nurse may visit PRN to address patientos wound care needs. o FACE TO FACE ENCOUNTER: MEDICARE and MEDICAID PATIENTS: I certify that this patient is under my care and that I had West face-to- face encounter that meets the physician face-to-face encounter requirements with this patient on this date. The encounter with the patient was in whole or in part for the following MEDICAL CONDITION: (primary reason for [place]) MEDICAL NECESSITY: I certify, that based on my findings, NURSING services are West medically necessary home health service. HOME BOUND STATUS: I certify that my clinical  findings support that this patient is homebound (i.e., Due to illness or injury, pt requires aid of supportive devices such as crutches, cane, wheelchairs, walkers, the use of special transportation or the assistance of another person to leave their place of residence. There is West normal inability to leave the home and doing so requires considerable and taxing effort. Other absences are for medical reasons / religious services and are infrequent or of short duration when for other reasons). o If current dressing causes regression in wound condition, may D/C ordered dressing product/s and apply Normal Saline Moist Dressing daily until next [place] / Other MD appointment. [place] of regression in wound condition at [phone]. o Please direct any NON-WOUND related issues/requests for orders to patient's Primary Care Physician Electronic Signature(s) Signed: 01/11/2020 4:09:07 PM By: Army Melia Signed: 01/12/2020 6:10:11 PM By: Worthy Keeler PA-C Entered By: Army Melia on 01/11/2020 13:05:52 Corey West ([id number]) -------------------------------------------------------------------------------- Problem List Details Patient Name: Corey West, Corey West. Date of Service: 01/11/2020 12:30 PM Medical Record Number: [id number] Patient Account Number: [id number] Date of Birth/Sex: 1951/08/15 (69 y.o. M) Treating RN: Army Melia Primary Care Provider: [name] Other Clinician: Referring Provider: [name] Treating Provider/Extender: Melburn Hake, Wavie Hashimi Weeks in Treatment: 7 Active Problems ICD-10 Evaluated Encounter Code Description Active Date Today Diagnosis I89.0 Lymphedema, not elsewhere classified 11/23/2019 No Yes L97.812 Non-pressure chronic ulcer of other part of right lower leg with fat layer 11/23/2019 No Yes exposed L97.822 Non-pressure chronic ulcer of other part of left lower leg with fat layer 11/23/2019 No Yes exposed E11.622 Type 2  diabetes mellitus with other skin ulcer 11/23/2019 No Yes I10 Essential (primary) hypertension 11/23/2019 No Yes Inactive Problems Resolved Problems Electronic Signature(s) Signed: 01/11/2020 12:59:20 PM By: Worthy Keeler PA-C Entered By: Worthy Keeler on 01/11/2020 12:59:20 Corey West ([id number]) -------------------------------------------------------------------------------- Progress Note Details Patient Name: Corey West Date of Service: 01/11/2020 12:30 PM Medical Record Number: [id number] Patient Account Number: [id number] Date of Birth/Sex: 15-Jul-1951 (69 y.o. M) Treating RN: Army Melia Primary Care Provider: [name] Other Clinician: Referring Provider: [name] Treating Provider/Extender: Melburn Hake, Daiquan Resnik Weeks in Treatment: 7 Subjective Chief Complaint Information obtained from Patient Bilateral LE Ulcers History of Present Illness (HPI) Pleasant 69 year old with West past medical history significant for morbid obesity, insulin-dependent type 2 diabetes (hemoglobin A1c 7.2 in January 2015), cirrhosis, and chronic kidney disease. He reports West left anterior calf ulcer since January 2015. He has had venous stasis ulcers in the past, which have healed spontaneously. No history of DVT. Does not regularly wear compression. Ambulating normally per his baseline. Recently completed West course of antibiotics. Treated with West Profore light and Coban 2 layer compression drainage, which both fell down and  he cut off. Tolerated Coban lite compression bandage. Returns to clinic for followup. No complaints today. No pain at this time. No fever or chills. No open wounds at this time. He is having difficulty finding compression garments that fit him. 04/23/17; this is now 69 year old man who returns to our clinic for nonhealing wounds on the right lateral and right posterior calf. He tells me that they have been present for 6 months. In the setting of severe right greater than left  lower extremity lymphedema likely secondary to chronic venous insufficiency. He has been followed in the lymphedema clinic at least since early June with compression wraps although these have not really helped and the patient is here for our review of this situation. The patient has had problems with edema in his legs for years. He has not worn compression. He is type II diabetic that sounds poorly controlled. He also has obstructive sleep apnea and cirrhosis. He is occasional cigar smoker 04/30/17; patient is West 69 year old man who has severe bilateral lower extremity lymphedema stage III since his early 38s he tells me. This is no doubt secondary to chronic venous insufficiency. He was going to the lymphedema clinic who forwarded him here due to skin breakdown in the right leg. He has not previously worn compression stockings. He has West large area of denuded epithelium on the posterior leg. He has West more open wound on the right lateral leg 05/07/17 on evaluation today patient appears to be doing about the same his wounds are showing signs of improvement and looking fairly well. With that being said he is not having any severe discomfort just some mild aching due to the swelling and really this is worse on the left than the right and the right side is where he actually has his wounds. He has been tolerating the [employer] wraps without complication and states this is actually much better than the wraps that he was getting at the lymphedema clinic most recently. He is happy with these wraps. He has no nausea, vomiting, diarrhea and no fevers or chills. We are still waiting to hear back in regard to the lymphedema pumps which I do think would be beneficial for him even with the compression wraps he still has West significant amount of edema noted of the bilateral lower extremities. 05/15/17 on evaluation today patient's right lower extremity edema and weeping especially the posterior portion appears to be doing  better. He still is having some issue here but fortunately it's minimal compared to previous weeks. He continues to tolerate the compression fairly well. Overall he is pleased with how things are progressing. No fevers, chills, nausea, or vomiting noted at this time. And patient is having only minimal discomfort. 05/22/17; the medial wound on the right leg actually looks considerably better and is epithelialized. He still has West small punched out area on the right lateral leg. Edema control is marginal end of the [employer]. We are working on getting him compression pumps 05/29/17; the patient's lymphedema pumps are apparently arriving tonight. In the meantime he took his Unna boots off last Sunday because the edema had progressed to the point that West word to titrate. Since then he has put nothing on his right leg and more specifically nothing on the right medial and lateral wounds 06/05/17; the patient has his lymphedema pumps and he is using them once West day on the right leg. There is West sizable reduction in the circumference of his leg although he arrived in  clinic today somewhat short of breath. 07/02/17; since the patient was last in clinic he was admitted on 2 occasions. He was admitted from 9/12 through 9/13 with lactic acidosis weakness and felt to have left lower extremity cellulitis. Due to the elevated lactic acid level he was felt to have sepsis although this was ruled out and the lactic acidosis was felt secondary to his chronic cirrhosis. He was initially treated with IV vancomycin for cellulitis and redness of his left leg but then this was subsequently recognized to be secondary to chronic venous stasis and chronic lymphedema. When he left the hospital he had Unna boots placed bilaterally. He was readmitted on 9/26 through 9/28 with GI bleeding. His EGD showed Barrett's esophagus and gastritis. He was discharged with West hemoglobin of 8 to follow-up with GI as an outpatient. There was nothing more  about his lower extremities during this short hospital stay. The patient has not been using his external compression pumps fearing fluid overload and shortness of breath although he feels better now that his hemoglobin is stabilized. He continues to have epithelial loss on his bilateral lower extremities. 07/09/17; patient has West large but superficial area on the right posterior leg and on the left anterior leg. He tells me he is been using his external compression pumps once West day for the required hour. He states the pumps hurt his right ankle. 07/17/17; patient was transfused West further 2 units since he was here the last time. His hemoglobin was once again found to be low. He has not been able to use his external compression pumps because of West combination of prolonged power outage in his home and I think issues with his other medical care. His wounds have actually worsened he now has loss of epithelialization all the way from the right lateral to the right medial posteriorly. The left side which has anterior loss of epithelialization also appears to be worse on the lateral aspect of the leg where there is denuded epithelium now as SONY, SCHLARB ([id number]) well. Leaking edema fluid. He has bilateral lymphedema. 08/12/2017 -- the patient has not been here for 25 days and has had the same compression wraps which have not been changed!! He says he was being worked up for West GI bleed and had some transfusions since he was seen last by Dr. Dellia Nims 08/20/17; mostly by review of what the patient is saying and West quick glance at his records in care everywhere I think this patient probably has Karlene Lineman, cirrhosis and portal hypertension. He is bleeding I think from gastric area and he is requiring recurrent transfusions. He has been told that there is nothing more that can be done and that his prognosis is poor. He arrived in our office in our office in tears. We've been using silver alginate and 3 layer compression  to his bilateral lower extremity wounds. He has significant lymphedema likely secondary to chronic venous insufficiency. He has external compression pumps at home that he had actually before he came to this clinic but he is not using 08/27/17; patient is doing well, wounds on his left leg are tiny one anteriorly and one laterally. He still has substantial wound on the posterior aspect of his right calf extending laterally for over this looks better than last time. He tells me he is able to use his compression pumps once West day on most days. He has his hemoglobin checked on Friday 09-19-17 He arrives today for follow up evaluation. He was unable  to come to the clinic last week r/t weather conditions, therefore compression has been in place for two weeks. He presents today with healed LLE and one partial thickness open area to the posterior RL. He states he is experiencing diarrhea secondary to antibiotic that will be stopped next week and has been unable to use lymphedema pumps. Will continue with compression bilaterally, follow up next week with plans to discharge and transition to lymphedema pumps 10/02/17; the patient arrives today with his juxta light stockings bilaterally. He also has external compression pumps at home. There are no open areas on his bilateral legs. He tells me he has had adjustments in his diuretics and had resultant hypokalemia but the amount of edema in his legs is West lot better although he stillhas changes of lymphedema 10/16/17; the patient has been using his juxta lites although they are not put on as high as we might like. He has very superficial excoriations at the top of his wraps but I don't think that anything to keep him in clinic here. Severe bilateral chronic venous insufficiency with secondary lymphedema. He has no open wound areas Readmission: 11/23/2019 patient presents today for readmission he was last seen here in the clinic on 10/16/2017. With that being said he is  unfortunately continue to have issues with lower extremity edema though he was using his lymphedema pumps on West fairly regular basis. Fortunately there is no signs of infection at this time which is good news there is West little bit of erythema that have me wondering about the possibility of infection but again I think this may just be secondary to the inflammation from the lymphedema and the current weeping causing skin breakdown as well. His plantar aspect of his foot on the left lower extremity actually is sloughing off West lot of callus skin externally there is nothing open under this area but nonetheless it does seem to be causing him some difficulty. Fortunately there is no signs of active infection at this time. No fevers, chills, nausea, vomiting, or diarrhea. The patient does have West history of diabetes mellitus type 2, hypertension, and lymphedema. 12/07/2019 upon evaluation today patient appears to be doing well with regard to the overall appearance of his lower extremities currently. He has been tolerating the dressing changes without complication. Fortunately his infection seems to be doing much better at this point. He has made excellent progress. With that being said there are no signs of active infection systemically at this point. 12/21/2019 upon evaluation today patient appears to be doing slightly better with regard to his lower extremities. Some of the areas are definitely improving from the standpoint of the size of any open/weeping areas. He still has significant lymphedema. He has been in the hospital and apparently was admitted from 12/14/2019 through 12/17/2019. Subsequently he was diagnosed with cellulitis of the lower extremity along with acute renal failure and he went in initially due to the fact that he was having issues with breathing. He does have congestive heart failure and has had West lot of issues with fluid here. Currently has BNP was 266 on 11/27/2019 and then subsequently at  discharge on 12/17/2019 was 256. Looking through his sepsis lab work it appears that he was septic and that he was placed on Cipro 500 mg West day for 10 days secondary to West Klebsiella oxytoca bacteremia. The patient has had West DVT study but still has not had West arterial study with ABI and TBI we probably need to go ahead  and see about making this referral back to vascular as we still do not really have West definitive answer at this point. Managed with regard to what his arterial flow is into the lower extremity. 12/28/2019 upon evaluation today patient unfortunately continues to have significant issues with weeping from his left lower extremity. Unfortunately he continues to experience significant issues here with the wraps becoming increasingly saturated. Unfortunately this seems to be related to one area on the anterior/medial portion of his left lower extremity where this is draining significantly and constantly without any signs of slowing. There are couple other open spots on his leg but nothing that is leaking in the way that this is. His heel is severely macerated secondary to the drainage. Home health is changing his wraps on Wednesday and Friday and were doing this on Mondays. Unfortunately I think he really needs West stronger compression wrap but at the moment are really not able to do this for him as were still waiting vascular testing. 01/11/2020 upon evaluation today patient actually appears to be doing significantly better with regard to his bilateral lower extremities with regard to some of the weeping. Nonetheless his right leg does have West lot of blisters as it was not rewrapped after his testing had vascular. Fortunately there is no signs of active infection at this time. No fevers, chills, nausea, vomiting, or diarrhea. His vascular studies did reveal that he has good arterial flow noted bilaterally there is no signs of obstruction at this point. Overall I am very pleased with how things stand in  that regard. Objective Constitutional Well-nourished and well-hydrated in no acute distress. GERHARDT, GLEED ([id number]) Vitals Time Taken: 12:42 PM, Height: 72 in, Weight: 318 lbs, BMI: 43.1, Temperature: 98.2 F, Pulse: 74 bpm, Respiratory Rate: 18 breaths/min, Blood Pressure: 167/45 mmHg. Respiratory normal breathing without difficulty. Psychiatric this patient is able to make decisions and demonstrates good insight into disease process. Alert and Oriented x 3. pleasant and cooperative. General Notes: Upon inspection patient's wound bed actually showed signs of good granulation at this time. Fortunately there is no evidence of active infection and overall I am actually very pleased with how things seem to be progressing. I do think we can increase to 4 layer compression wrap which will better compress him hopefully this should help clear up the blistering as well as the weeping. Integumentary (Hair, Skin) Wound #10 status is Open. Original cause of wound was Gradually Appeared. The wound is located on the Left,Lateral Lower Leg. The wound measures 0.5cm length x 1.2cm width x 0.1cm depth; 0.471cm^2 area and 0.047cm^3 volume. There is Fat Layer (Subcutaneous Tissue) Exposed exposed. There is no tunneling or undermining noted. There is West large amount of serous drainage noted. The wound margin is flat and intact. There is large (67-100%) pink granulation within the wound bed. There is West small (1-33%) amount of necrotic tissue within the wound bed including Adherent Slough. Wound #9 status is Open. Original cause of wound was Gradually Appeared. The wound is located on the Left,Medial Lower Leg. The wound measures 0.8cm length x 0.4cm width x 0.1cm depth; 0.251cm^2 area and 0.025cm^3 volume. There is Fat Layer (Subcutaneous Tissue) Exposed exposed. There is no tunneling or undermining noted. There is West large amount of serous drainage noted. The wound margin is flat and intact. There is large  (67-100%) pink granulation within the wound bed. There is West small (1-33%) amount of necrotic tissue within the wound bed including Adherent Slough. Assessment Active Problems  ICD-10 Lymphedema, not elsewhere classified Non-pressure chronic ulcer of other part of right lower leg with fat layer exposed Non-pressure chronic ulcer of other part of left lower leg with fat layer exposed Type 2 diabetes mellitus with other skin ulcer Essential (primary) hypertension Procedures Wound #10 Pre-procedure diagnosis of Wound #10 is West Lymphedema located on the Left,Lateral Lower Leg . There was West Four Layer Compression Therapy Procedure by Army Melia, RN. Post procedure Diagnosis Wound #10: Same as Pre-Procedure Wound #9 Pre-procedure diagnosis of Wound #9 is West Lymphedema located on the Left,Medial Lower Leg . There was West Four Layer Compression Therapy Procedure by Army Melia, RN. Post procedure Diagnosis Wound #9: Same as Pre-Procedure Plan Wound Cleansing: Wound #10 Left,Lateral Lower Leg: ANDREI, MCCOOK ([id number]) Cleanse wound with mild soap and water Wound #9 Left,Medial Lower Leg: Cleanse wound with mild soap and water Secondary Dressing: XtraSorb - over leaking areas Dressing Change Frequency: Wound #10 Left,Lateral Lower Leg: Dressing is to be changed Monday and Thursday. Wound #9 Left,Medial Lower Leg: Dressing is to be changed Monday and Thursday. Follow-up Appointments: Wound #10 Left,Lateral Lower Leg: Return Appointment in 2 weeks. - HH to change wraps twice next week until next appointment Wound #9 Left,Medial Lower Leg: Return Appointment in 2 weeks. - HH to change wraps twice next week until next appointment Edema Control: Wound #10 Left,Lateral Lower Leg: 4 Layer Compression System - Bilateral Wound #9 Left,Medial Lower Leg: 4 Layer Compression System - Bilateral Home Health: Wound #10 Left,Lateral Lower Leg: Continue Home Health Visits - [place]  Nurse may visit PRN to address patient s wound care needs. FACE TO FACE ENCOUNTER: MEDICARE and MEDICAID PATIENTS: I certify that this patient is under my care and that I had West face-to-face encounter that meets the physician face-to-face encounter requirements with this patient on this date. The encounter with the patient was in whole or in part for the following MEDICAL CONDITION: (primary reason for [place]) MEDICAL NECESSITY: I certify, that based on my findings, NURSING services are West medically necessary home health service. HOME BOUND STATUS: I certify that my clinical findings support that this patient is homebound (i.e., Due to illness or injury, pt requires aid of supportive devices such as crutches, cane, wheelchairs, walkers, the use of special transportation or the assistance of another person to leave their place of residence. There is West normal inability to leave the home and doing so requires considerable and taxing effort. Other absences are for medical reasons / religious services and are infrequent or of short duration when for other reasons). If current dressing causes regression in wound condition, may D/C ordered dressing product/s and apply Normal Saline Moist Dressing daily until next [place] / Other MD appointment. [place] of regression in wound condition at [phone]. Please direct any NON-WOUND related issues/requests for orders to patient's Primary Care Physician Wound #9 Left,Medial Lower Leg: [place] Nurse may visit PRN to address patient s wound care needs. FACE TO FACE ENCOUNTER: MEDICARE and MEDICAID PATIENTS: I certify that this patient is under my care and that I had West face-to-face encounter that meets the physician face-to-face encounter requirements with this patient on this date. The encounter with the patient was in whole or in part for the following MEDICAL CONDITION: (primary  reason for [place]) MEDICAL NECESSITY: I certify, that based on my findings, NURSING services are West medically necessary home health service. HOME BOUND  STATUS: I certify that my clinical findings support that this patient is homebound (i.e., Due to illness or injury, pt requires aid of supportive devices such as crutches, cane, wheelchairs, walkers, the use of special transportation or the assistance of another person to leave their place of residence. There is West normal inability to leave the home and doing so requires considerable and taxing effort. Other absences are for medical reasons / religious services and are infrequent or of short duration when for other reasons). If current dressing causes regression in wound condition, may D/C ordered dressing product/s and apply Normal Saline Moist Dressing daily until next [place] / Other MD appointment. [place] of regression in wound condition at [phone]. Please direct any NON-WOUND related issues/requests for orders to patient's Primary Care Physician We will continue with the XtraSorb at this point as that seems to be doing well. 2. I am also can recommend that we go ahead and continue with the compression wrap although I would recommend switching to West 4-layer compression wrap at this point and patient is in agreement with that plan. We will see patient back for reevaluation in 1 week here in the clinic. If anything worsens or changes patient will contact our office for additional recommendations. Electronic Signature(s) Signed: 01/11/2020 1:25:16 PM By: Worthy Keeler PA-C Entered By: Worthy Keeler on 01/11/2020 13:25:16 Corey West ([id number]) -------------------------------------------------------------------------------- SuperBill Details Patient Name: Corey West, Corey West. Date of Service: 01/11/2020 Medical Record Number: [id number] Patient Account Number: [id number] Date of Birth/Sex: [birth year]  (69 y.o. M) Treating RN: Army Melia Primary Care Provider: [name] Other Clinician: Referring Provider: [name] Treating Provider/Extender: Melburn Hake, Liborio Saccent[place] Weeks in Treatment: 7 Diagnosis Coding ICD-10 Codes Code Description I89.0 Lymphedema, not elsewhere classified L97.812 Non-pressure chronic ulcer of other part of right lower leg with fat layer exposed L97.822 Non-pressure chronic ulcer of other part of left lower leg with fat layer exposed E11.622 Type 2 diabetes mellitus with other skin ulcer I10 Essential (primary) hypertension Facility Procedures CPT4: Description Modifier Quantity Code [id number] BILATERAL: Application of multi-layer venous compression system; leg (below knee), including ankle 1 and foot. Physician Procedures CPT4 Code: [id number] Description: 17711 - WC PHYS LEVEL 3 - EST PT Modifier: Quantity: 1 CPT4 Code: Description: ICD-10 Diagnosis Description I89.0 Lymphedema, not elsewhere classified A57.903 Non-pressure chronic ulcer of other part of right lower leg with fat layer L97.822 Non-pressure chronic ulcer of other part of left lower leg with fat layer e  E11.622 Type 2 diabetes mellitus with other skin ulcer Modifier: exposed xposed Quantity: Electronic Signature(s) Signed: 01/11/2020 1:25:29 PM By: Worthy Keeler PA-C Entered By: Worthy Keeler on 01/11/2020 13:25:29

## 2020-01-11 NOTE — Progress Notes (Signed)
Corey West, Corey West ([id number]) Visit Report for 01/11/2020 Arrival Information Details Patient Name: Corey West. Date of Service: 01/11/2020 12:30 PM Medical Record Number: [id number] Patient Account Number: [id number] Date of Birth/Sex: [birth year] (69 y.o. M) Treating RN: Montey Hora Primary Care Seibert Keeter: [name] Other Clinician: Referring Maliya Marich: [name] Treating Simrin Vegh/Extender: Melburn Hake, HOYT Weeks in Treatment: 7 Visit Information History Since Last Visit Added or deleted any medications: No Patient Arrived: Cane Any new allergies or adverse reactions: No Arrival Time: 12:39 Had a fall or experienced change in No Accompanied By: self activities of daily living that may affect Transfer Assistance: None risk of falls: Patient Identification Verified: Yes Signs or symptoms of abuse/neglect since last visito No Secondary Verification Process Completed: Yes Hospitalized since last visit: No Implantable device outside of the clinic excluding No cellular tissue based products placed in the center since last visit: Has Dressing in Place as Prescribed: Yes Has Compression in Place as Prescribed: Yes Pain Present Now: No Electronic Signature(s) Signed: 01/11/2020 4:36:33 PM By: Montey Hora Entered By: Montey Hora on 01/11/2020 12:42:09 [name] ([id number]) -------------------------------------------------------------------------------- Compression Therapy Details Patient Name: Corey Cower A. Date of Service: 01/11/2020 12:30 PM Medical Record Number: [id number] Patient Account Number: [id number] Date of Birth/Sex: Aug 21, 1951 (69 y.o. M) Treating RN: Army Melia Primary Care Olvin Rohr: [name] Other Clinician: Referring Altheia Shafran: [name] Treating Legaci Tarman/Extender: Melburn Hake, HOYT Weeks in Treatment: 7 Compression Therapy Performed for Wound Assessment: Wound #10 Left,Lateral Lower Leg Performed By: Clinician Army Melia,  RN Compression Type: Four Layer Post Procedure Diagnosis Same as Pre-procedure Electronic Signature(s) Signed: 01/11/2020 4:09:07 PM By: Army Melia Entered By: Army Melia on 01/11/2020 13:03:28 [name] ([id number]) -------------------------------------------------------------------------------- Compression Therapy Details Patient Name: Corey Cower A. Date of Service: 01/11/2020 12:30 PM Medical Record Number: [id number] Patient Account Number: [id number] Date of Birth/Sex: 27-Feb-1951 (69 y.o. M) Treating RN: Army Melia Primary Care Marye Eagen: [name] Other Clinician: Referring Simora Dingee: [name] Treating Simcha Speir/Extender: Melburn Hake, HOYT Weeks in Treatment: 7 Compression Therapy Performed for Wound Assessment: Wound #9 Left,Medial Lower Leg Performed By: Clinician Army Melia, RN Compression Type: Four Layer Post Procedure Diagnosis Same as Pre-procedure Electronic Signature(s) Signed: 01/11/2020 4:09:07 PM By: Army Melia Entered By: Army Melia on 01/11/2020 13:03:28 [name] ([id number]) -------------------------------------------------------------------------------- Encounter Discharge Information Details Patient Name: [name], [name] Date of Service: 01/11/2020 12:30 PM Medical Record Number: [id number] Patient Account Number: [id number] Date of Birth/Sex: 15-Jun-1951 (69 y.o. M) Treating RN: Army Melia Primary Care Naylin Burkle: [name] Other Clinician: Referring Mao Lockner: [name] Treating Adalida Garver/Extender: Melburn Hake, HOYT Weeks in Treatment: 7 Encounter Discharge Information Items Discharge Condition: Stable Ambulatory Status: Ambulatory Discharge Destination: Home Transportation: Private Auto Accompanied By: self Schedule Follow-up Appointment: Yes Clinical Summary of Care: Electronic Signature(s) Signed: 01/11/2020 4:09:07 PM By: Army Melia Entered By: Army Melia on 01/11/2020 13:07:21 [name]  ([id number]) -------------------------------------------------------------------------------- Lower Extremity Assessment Details Patient Name: [name], [name] Date of Service: 01/11/2020 12:30 PM Medical Record Number: [id number] Patient Account Number: [id number] Date of Birth/Sex: 1951/06/05 (69 y.o. M) Treating RN: Montey Hora Primary Care Jaely Silman: [name] Other Clinician: Referring Kaiyan Luczak: [name] Treating Juventino Pavone/Extender: Melburn Hake, HOYT Weeks in Treatment: 7 Edema Assessment Assessed: [Left: No] [Right: No] Edema: [Left: Yes] [Right: Yes] Calf Left: Right: Point of Measurement: 32 cm From Medial Instep 39 cm 44 cm Ankle Left: Right: Point of Measurement: 12 cm From Medial Instep 29 cm 28.5 cm Vascular Assessment Pulses: Dorsalis Pedis Palpable: [Left:Yes] [  Right:Yes] Electronic Signature(s) Signed: 01/11/2020 4:36:33 PM By: Montey Hora Entered By: Montey Hora on 01/11/2020 12:51:00 [name] ([id number]) -------------------------------------------------------------------------------- Multi Wound Chart Details Patient Name: [name], [name] Date of Service: 01/11/2020 12:30 PM Medical Record Number: [id number] Patient Account Number: [id number] Date of Birth/Sex: [birth year] (69 y.o. M) Treating RN: Army Melia Primary Care Kyndal Gloster: [name] Other Clinician: Referring Shatina Streets: [name] Treating Lawanda Holzheimer/Extender: Melburn Hake, HOYT Weeks in Treatment: 7 Vital Signs Height(in): 72 Pulse(bpm): 65 Weight(lbs): 387 Blood Pressure(mmHg): 167/45 Body Mass Index(BMI): 43 Temperature(F): 98.2 Respiratory Rate(breaths/min): 18 Photos: [N/A:N/A] Wound Location: Left, Lateral Lower Leg Left, Medial Lower Leg N/A Wounding Event: Gradually Appeared Gradually Appeared N/A Primary Etiology: Lymphedema Lymphedema N/A Comorbid History: Cataracts, Anemia, Sleep Apnea, Cataracts, Anemia, Sleep Apnea, N/A Hypertension, Cirrhosis , Type II  Hypertension, Cirrhosis , Type II Diabetes Diabetes Date Acquired: 12/21/2019 12/21/2019 N/A Weeks of Treatment: 3 3 N/A Wound Status: Open Open N/A Measurements L x W x D (cm) 0.5x1.2x0.1 0.8x0.4x0.1 N/A Area (cm) : 0.471 0.251 N/A Volume (cm) : 0.047 0.025 N/A % Reduction in Area: 25.00% 51.50% N/A % Reduction in Volume: 25.40% 51.90% N/A Classification: Full Thickness Without Exposed Full Thickness Without Exposed N/A Support Structures Support Structures Exudate Amount: Large Large N/A Exudate Type: Serous Serous N/A Exudate Color: amber amber N/A Wound Margin: Flat and Intact Flat and Intact N/A Granulation Amount: Large (67-100%) Large (67-100%) N/A Granulation Quality: Pink Pink N/A Necrotic Amount: Small (1-33%) Small (1-33%) N/A Exposed Structures: Fat Layer (Subcutaneous Tissue) Fat Layer (Subcutaneous Tissue) N/A Exposed: Yes Exposed: Yes Fascia: No Fascia: No Tendon: No Tendon: No Muscle: No Muscle: No Joint: No Joint: No Bone: No Bone: No Epithelialization: Medium (34-66%) Small (1-33%) N/A Treatment Notes Electronic Signature(s) Signed: 01/11/2020 4:09:07 PM By: Verdis Prime, Linus Mako ([id number]) Entered By: Army Melia on 01/11/2020 13:01:02 [name] ([id number]) -------------------------------------------------------------------------------- Multi-Disciplinary Care Plan Details Patient Name: ZETHAN, ALFIERI A. Date of Service: 01/11/2020 12:30 PM Medical Record Number: [id number] Patient Account Number: [id number] Date of Birth/Sex: Sep 16, 1951 (69 y.o. M) Treating RN: Army Melia Primary Care Samyuktha Brau: [name] Other Clinician: Referring Carmelia Tiner: [name] Treating Niomie Englert/Extender: Melburn Hake, HOYT Weeks in Treatment: 7 Active Inactive Nutrition Nursing Diagnoses: Impaired glucose control: actual or potential Goals: Patient/caregiver verbalizes understanding of need to maintain therapeutic glucose control per primary care  physician Date Initiated: 11/23/2019 Target Resolution Date: 12/11/2019 Goal Status: Active Interventions: Assess patient nutrition upon admission and as needed per policy Notes: Orientation to the Wound Care Program Nursing Diagnoses: Knowledge deficit related to the wound healing center program Goals: Patient/caregiver will verbalize understanding of the [facility] Date Initiated: 11/23/2019 Target Resolution Date: 12/11/2019 Goal Status: Active Interventions: Provide education on orientation to the wound center Notes: Wound/Skin Impairment Nursing Diagnoses: Impaired tissue integrity Goals: Ulcer/skin breakdown will have a volume reduction of 30% by week 4 Date Initiated: 11/23/2019 Target Resolution Date: 12/11/2019 Goal Status: Active Interventions: Assess ulceration(s) every visit Notes: Electronic Signature(s) Signed: 01/11/2020 4:09:07 PM By: Army Melia Entered By: Army Melia on 01/11/2020 13:00:49 [name] ([id number]) KASSON, LAMERE ([id number]) -------------------------------------------------------------------------------- Pain Assessment Details Patient Name: Corey Cower A. Date of Service: 01/11/2020 12:30 PM Medical Record Number: [id number] Patient Account Number: [id number] Date of Birth/Sex: 1951/08/14 (69 y.o. M) Treating RN: Montey Hora Primary Care Ayari Liwanag: [name] Other Clinician: Referring Jakaylah Schlafer: [name] Treating Sharen Youngren/Extender: Melburn Hake, HOYT Weeks in Treatment: 7 Active Problems Location of Pain Severity and Description of Pain Patient Has Paino  No Site Locations Pain Management and Medication Current Pain Management: Electronic Signature(s) Signed: 01/11/2020 4:36:33 PM By: Montey Hora Entered By: Montey Hora on 01/11/2020 12:44:01 [name] ([id number]) -------------------------------------------------------------------------------- Patient/Caregiver Education Details Patient Name:  [name] Date of Service: 01/11/2020 12:30 PM Medical Record Number: [id number] Patient Account Number: [id number] Date of Birth/Gender: [birth year] (69 y.o. M) Treating RN: Army Melia Primary Care Physician: [name] Other Clinician: Referring Physician: [name] Treating Physician/Extender: Sharalyn Ink in Treatment: 7 Education Assessment Education Provided To: Patient Education Topics Provided Wound/Skin Impairment: Handouts: Caring for Your Ulcer Methods: Demonstration, Explain/Verbal Responses: State content correctly Electronic Signature(s) Signed: 01/11/2020 4:09:07 PM By: Army Melia Entered By: Army Melia on 01/11/2020 13:06:39 [name] ([id number]) -------------------------------------------------------------------------------- Wound Assessment Details Patient Name: DUVAN, MOUSEL A. Date of Service: 01/11/2020 12:30 PM Medical Record Number: [id number] Patient Account Number: [id number] Date of Birth/Sex: [birth year] (69 y.o. M) Treating RN: Montey Hora Primary Care Kallan Merrick: [name] Other Clinician: Referring Akasha Melena: [name] Treating Jerrian Mells/Extender: Melburn Hake, HOYT Weeks in Treatment: 7 Wound Status Wound Number: 10 Primary Lymphedema Etiology: Wound Location: Left, Lateral Lower Leg Wound Status: Open Wounding Event: Gradually Appeared Comorbid Cataracts, Anemia, Sleep Apnea, Hypertension, Date Acquired: 12/21/2019 History: Cirrhosis , Type II Diabetes Weeks Of Treatment: 3 Clustered Wound: No Photos Wound Measurements Length: (cm) 0.5 Width: (cm) 1.2 Depth: (cm) 0.1 Area: (cm) 0.471 Volume: (cm) 0.047 % Reduction in Area: 25% % Reduction in Volume: 25.4% Epithelialization: Medium (34-66%) Tunneling: No Undermining: No Wound Description Classification: Full Thickness Without Exposed Support Struc Wound Margin: Flat and Intact Exudate Amount: Large Exudate Type: Serous Exudate Color: amber tures  Foul Odor After Cleansing: No Slough/Fibrino Yes Wound Bed Granulation Amount: Large (67-100%) Exposed Structure Granulation Quality: Pink Fascia Exposed: No Necrotic Amount: Small (1-33%) Fat Layer (Subcutaneous Tissue) Exposed: Yes Necrotic Quality: Adherent Slough Tendon Exposed: No Muscle Exposed: No Joint Exposed: No Bone Exposed: No Treatment Notes Wound #10 (Left, Lateral Lower Leg) Notes xsorb, 4 layer bilateral Electronic Signature(s) Signed: 01/11/2020 4:36:33 PM By: Chauncy Lean, Linus Mako ([id number]) Entered By: Montey Hora on 01/11/2020 12:56:27 [name] ([id number]) -------------------------------------------------------------------------------- Wound Assessment Details Patient Name: Corey Cower A. Date of Service: 01/11/2020 12:30 PM Medical Record Number: [id number] Patient Account Number: [id number] Date of Birth/Sex: [birth year] (69 y.o. M) Treating RN: Montey Hora Primary Care Tion Tse: [name] Other Clinician: Referring Yeraldy Spike: [name] Treating Aiyanah Kalama/Extender: Melburn Hake, HOYT Weeks in Treatment: 7 Wound Status Wound Number: 9 Primary Lymphedema Etiology: Wound Location: Left, Medial Lower Leg Wound Status: Open Wounding Event: Gradually Appeared Comorbid Cataracts, Anemia, Sleep Apnea, Hypertension, Date Acquired: 12/21/2019 History: Cirrhosis , Type II Diabetes Weeks Of Treatment: 3 Clustered Wound: No Photos Wound Measurements Length: (cm) 0.8 Width: (cm) 0.4 Depth: (cm) 0.1 Area: (cm) 0.251 Volume: (cm) 0.025 % Reduction in Area: 51.5% % Reduction in Volume: 51.9% Epithelialization: Small (1-33%) Tunneling: No Undermining: No Wound Description Classification: Full Thickness Without Exposed Support Struc Wound Margin: Flat and Intact Exudate Amount: Large Exudate Type: Serous Exudate Color: amber tures Foul Odor After Cleansing: No Slough/Fibrino Yes Wound Bed Granulation Amount: Large (67-100%)  Exposed Structure Granulation Quality: Pink Fascia Exposed: No Necrotic Amount: Small (1-33%) Fat Layer (Subcutaneous Tissue) Exposed: Yes Necrotic Quality: Adherent Slough Tendon Exposed: No Muscle Exposed: No Joint Exposed: No Bone Exposed: No Treatment Notes Wound #9 (Left, Medial Lower Leg) Notes xsorb, 4 layer bilateral Electronic Signature(s) Signed: 01/11/2020 4:36:33 PM By: Chauncy Lean, Linus Mako ([id number]) Entered  By: Montey Hora on 01/11/2020 12:56:48 [name] ([id number]) -------------------------------------------------------------------------------- [place] Details Patient Name: Corey Cower A. Date of Service: 01/11/2020 12:30 PM Medical Record Number: [id number] Patient Account Number: [id number] Date of Birth/Sex: September 18, 1951 (69 y.o. M) Treating RN: Montey Hora Primary Care Xavia Kniskern: [name] Other Clinician: Referring Arshiya Jakes: [name] Treating Treesa Mccully/Extender: Melburn Hake, HOYT Weeks in Treatment: 7 Vital Signs Time Taken: 12:42 Temperature (F): 98.2 Height (in): 72 Pulse (bpm): 74 Weight (lbs): 318 Respiratory Rate (breaths/min): 18 Body Mass Index (BMI): 43.1 Blood Pressure (mmHg): 167/45 Reference Range: 80 - 120 mg / dl Electronic Signature(s) Signed: 01/11/2020 4:36:33 PM By: Montey Hora Entered By: Montey Hora on 01/11/2020 12:43:53

## 2020-01-18 ENCOUNTER — Other Ambulatory Visit: Payer: Self-pay

## 2020-01-18 DIAGNOSIS — E11622 Type 2 diabetes mellitus with other skin ulcer: Secondary | ICD-10-CM | POA: Diagnosis not present

## 2020-01-18 NOTE — Progress Notes (Signed)
Corey, West ([id number]) Visit Report for 01/18/2020 Arrival Information Details Patient Name: Corey West, Corey West. Date of Service: 01/18/2020 11:00 AM Medical Record Number: [id number] Patient Account Number: [id number] Date of Birth/Sex: [birth year] (69 y.o. M) Treating RN: Montey Hora Primary Care Laderrick Wilk: [name] Other Clinician: Referring Denali Sharma: [name] Treating Sevastian Witczak/Extender: Melburn Hake, HOYT Weeks in Treatment: 8 Visit Information History Since Last Visit Added or deleted any medications: No Patient Arrived: Cane Any new allergies or adverse reactions: No Arrival Time: 11:13 Had a fall or experienced change in No Accompanied By: self activities of daily living that may affect Transfer Assistance: None risk of falls: Patient Identification Verified: Yes Signs or symptoms of abuse/neglect since last visito No Secondary Verification Process Completed: Yes Hospitalized since last visit: No Implantable device outside of the clinic excluding No cellular tissue based products placed in the center since last visit: Has Dressing in Place as Prescribed: Yes Has Compression in Place as Prescribed: Yes Pain Present Now: No Notes 97.6 Electronic Signature(s) Signed: 01/18/2020 12:02:39 PM By: Montey Hora Entered By: Montey Hora on 01/18/2020 11:13:22 Corey West ([id number]) -------------------------------------------------------------------------------- Compression Therapy Details Patient Name: Corey Cower A. Date of Service: 01/18/2020 11:00 AM Medical Record Number: [id number] Patient Account Number: [id number] Date of Birth/Sex: April 08, 1951 (69 y.o. M) Treating RN: Montey Hora Primary Care Braidon Chermak: [name] Other Clinician: Referring Kiondra Caicedo: [name] Treating Celestina Gironda/Extender: Melburn Hake, HOYT Weeks in Treatment: 8 Compression Therapy Performed for Wound Assessment: Wound #10 Left,Lateral Lower Leg Performed By: Clinician Montey Hora, RN Compression Type: Four Layer Electronic Signature(s) Signed: 01/18/2020 12:02:39 PM By: Montey Hora Entered By: Montey Hora on 01/18/2020 11:15:27 Corey West ([id number]) -------------------------------------------------------------------------------- Compression Therapy Details Patient Name: [name], [name] Date of Service: 01/18/2020 11:00 AM Medical Record Number: [id number] Patient Account Number: [id number] Date of Birth/Sex: 11/26/1950 (69 y.o. M) Treating RN: Montey Hora Primary Care Mckala Pantaleon: [name] Other Clinician: Referring Wells Gerdeman: [name] Treating Tiffany Calmes/Extender: Melburn Hake, HOYT Weeks in Treatment: 8 Compression Therapy Performed for Wound Assessment: Wound #9 Left,Medial Lower Leg Performed By: Clinician Montey Hora, RN Compression Type: Four Layer Electronic Signature(s) Signed: 01/18/2020 12:02:39 PM By: Montey Hora Entered By: Montey Hora on 01/18/2020 11:15:28 Corey West ([id number]) -------------------------------------------------------------------------------- Encounter Discharge Information Details Patient Name: [name], [name] Date of Service: 01/18/2020 11:00 AM Medical Record Number: [id number] Patient Account Number: [id number] Date of Birth/Sex: 1951-09-11 (69 y.o. M) Treating RN: Montey Hora Primary Care Huntley Knoop: [name] Other Clinician: Referring Elmina Hendel: [name] Treating Shanel Prazak/Extender: Melburn Hake, HOYT Weeks in Treatment: 8 Encounter Discharge Information Items Discharge Condition: Stable Ambulatory Status: Cane Discharge Destination: Home Transportation: Private Auto Accompanied By: self Schedule Follow-up Appointment: Yes Clinical Summary of Care: Electronic Signature(s) Signed: 01/18/2020 12:02:39 PM By: Montey Hora Entered By: Montey Hora on 01/18/2020 11:16:00 Corey West  ([id number]) -------------------------------------------------------------------------------- Wound Assessment Details Patient Name: [name], [name] Date of Service: 01/18/2020 11:00 AM Medical Record Number: [id number] Patient Account Number: [id number] Date of Birth/Sex: 1951-08-27 (69 y.o. M) Treating RN: Montey Hora Primary Care Keevin Panebianco: [name] Other Clinician: Referring Zairah Arista: [name] Treating Vondra Aldredge/Extender: Melburn Hake, HOYT Weeks in Treatment: 8 Wound Status Wound Number: 10 Primary Lymphedema Etiology: Wound Location: Left, Lateral Lower Leg Wound Status: Open Wounding Event: Gradually Appeared Comorbid Cataracts, Anemia, Sleep Apnea, Hypertension, Date Acquired: 12/21/2019 History: Cirrhosis , Type II Diabetes Weeks Of Treatment: 4 Clustered Wound: No Photos Wound Measurements Length: (cm) 0.5 Width: (cm) 1.2 Depth: (cm) 0.1 Area: (cm) 0.471 Volume: (cm)  0.047 % Reduction in Area: 25% % Reduction in Volume: 25.4% Epithelialization: Medium (34-66%) Tunneling: No Undermining: No Wound Description Classification: Full Thickness Without Exposed Support Struc Wound Margin: Flat and Intact Exudate Amount: Large Exudate Type: Serous Exudate Color: amber tures Foul Odor After Cleansing: No Slough/Fibrino Yes Wound Bed Granulation Amount: Large (67-100%) Exposed Structure Granulation Quality: Pink Fascia Exposed: No Necrotic Amount: Small (1-33%) Fat Layer (Subcutaneous Tissue) Exposed: Yes Necrotic Quality: Adherent Slough Tendon Exposed: No Muscle Exposed: No Joint Exposed: No Bone Exposed: No Treatment Notes Wound #10 (Left, Lateral Lower Leg) Notes xsorb, 4 layer bilateral Electronic Signature(s) Signed: 01/18/2020 12:02:39 PM By: Chauncy Lean, Linus Mako ([id number]) Entered By: Montey Hora on 01/18/2020 11:14:42 Corey West  ([id number]) -------------------------------------------------------------------------------- Wound Assessment Details Patient Name: Corey Cower A. Date of Service: 01/18/2020 11:00 AM Medical Record Number: [id number] Patient Account Number: [id number] Date of Birth/Sex: [birth year] (69 y.o. M) Treating RN: Montey Hora Primary Care Kimaria Struthers: [name] Other Clinician: Referring Khiara Shuping: [name] Treating Haidee Stogsdill/Extender: Melburn Hake, HOYT Weeks in Treatment: 8 Wound Status Wound Number: 9 Primary Lymphedema Etiology: Wound Location: Left, Medial Lower Leg Wound Status: Open Wounding Event: Gradually Appeared Comorbid Cataracts, Anemia, Sleep Apnea, Hypertension, Date Acquired: 12/21/2019 History: Cirrhosis , Type II Diabetes Weeks Of Treatment: 4 Clustered Wound: No Photos Wound Measurements Length: (cm) 0.8 Width: (cm) 0.4 Depth: (cm) 0.1 Area: (cm) 0.251 Volume: (cm) 0.025 % Reduction in Area: 51.5% % Reduction in Volume: 51.9% Epithelialization: Small (1-33%) Tunneling: No Undermining: No Wound Description Classification: Full Thickness Without Exposed Support Struc Wound Margin: Flat and Intact Exudate Amount: Large Exudate Type: Serous Exudate Color: amber tures Foul Odor After Cleansing: No Slough/Fibrino Yes Wound Bed Granulation Amount: Large (67-100%) Exposed Structure Granulation Quality: Pink Fascia Exposed: No Necrotic Amount: Small (1-33%) Fat Layer (Subcutaneous Tissue) Exposed: Yes Necrotic Quality: Adherent Slough Tendon Exposed: No Muscle Exposed: No Joint Exposed: No Bone Exposed: No Treatment Notes Wound #9 (Left, Medial Lower Leg) Notes xsorb, 4 layer bilateral Electronic Signature(s) Signed: 01/18/2020 12:02:39 PM By: Chauncy Lean, Linus Mako ([id number]) Entered By: Montey Hora on 01/18/2020 11:15:06

## 2020-01-25 ENCOUNTER — Encounter: Payer: Medicare Other | Admitting: Physician Assistant

## 2020-01-25 ENCOUNTER — Other Ambulatory Visit: Payer: Self-pay

## 2020-01-25 DIAGNOSIS — E11622 Type 2 diabetes mellitus with other skin ulcer: Secondary | ICD-10-CM | POA: Diagnosis not present

## 2020-01-25 NOTE — Progress Notes (Addendum)
DERIN, GRANQUIST ([id number]) Visit Report for 01/25/2020 Chief Complaint Document Details Patient Name: Corey, West. Date of Service: 01/25/2020 12:30 PM Medical Record Number: [id number] Patient Account Number: [id number] Date of Birth/Sex: [birth year] (69 y.o. M) Treating RN: Army Melia Primary Care Provider: [name] Other Clinician: Referring Provider: [name] Treating Provider/Extender: Melburn Hake, Cosima Prentiss Weeks in Treatment: 9 Information Obtained from: Patient Chief Complaint Bilateral LE Ulcers Electronic Signature(s) Signed: 01/25/2020 12:58:25 PM By: Worthy Keeler PA-C Entered By: Worthy Keeler on 01/25/2020 12:58:25 Corey West ([id number]) -------------------------------------------------------------------------------- HPI Details Patient Name: Corey West Date of Service: 01/25/2020 12:30 PM Medical Record Number: [id number] Patient Account Number: [id number] Date of Birth/Sex: 1951-01-18 (69 y.o. M) Treating RN: Army Melia Primary Care Provider: [name] Other Clinician: Referring Provider: [name] Treating Provider/Extender: Melburn Hake, Deshaun Weisinger Weeks in Treatment: 9 History of Present Illness HPI Description: Pleasant 69 year old with a past medical history significant for morbid obesity, insulin-dependent type 2 diabetes (hemoglobin A1c 7.2 in January 2015), cirrhosis, and chronic kidney disease. He reports a left anterior calf ulcer since January 2015. He has had venous stasis ulcers in the past, which have healed spontaneously. No history of DVT. Does not regularly wear compression. Ambulating normally per his baseline. Recently completed a course of antibiotics. Treated with a Profore light and Coban 2 layer compression drainage, which both fell down and he cut off. Tolerated Coban lite compression bandage. Returns to clinic for followup. No complaints today. No pain at this time. No fever or chills. No open wounds at this time. He is having  difficulty finding compression garments that fit him. 04/23/17; this is now 69 year old man who returns to our clinic for nonhealing wounds on the right lateral and right posterior calf. He tells me that they have been present for 6 months. In the setting of severe right greater than left lower extremity lymphedema likely secondary to chronic venous insufficiency. He has been followed in the lymphedema clinic at least since early June with compression wraps although these have not really helped and the patient is here for our review of this situation. The patient has had problems with edema in his legs for years. He has not worn compression. He is type II diabetic that sounds poorly controlled. He also has obstructive sleep apnea and cirrhosis. He is occasional cigar smoker 04/30/17; patient is a 69 year old man who has severe bilateral lower extremity lymphedema stage III since his early 61s he tells me. This is no doubt secondary to chronic venous insufficiency. He was going to the lymphedema clinic who forwarded him here due to skin breakdown in the right leg. He has not previously worn compression stockings. He has a large area of denuded epithelium on the posterior leg. He has a more open wound on the right lateral leg 05/07/17 on evaluation today patient appears to be doing about the same his wounds are showing signs of improvement and looking fairly well. With that being said he is not having any severe discomfort just some mild aching due to the swelling and really this is worse on the left than the right and the right side is where he actually has his wounds. He has been tolerating the [employer] wraps without complication and states this is actually much better than the wraps that he was getting at the lymphedema clinic most recently. He is happy with these wraps. He has no nausea, vomiting, diarrhea and no fevers or chills. We are still waiting to hear  back in regard to the lymphedema pumps  which I do think would be beneficial for him even with the compression wraps he still has a significant amount of edema noted of the bilateral lower extremities. 05/15/17 on evaluation today patient's right lower extremity edema and weeping especially the posterior portion appears to be doing better. He still is having some issue here but fortunately it's minimal compared to previous weeks. He continues to tolerate the compression fairly well. Overall he is pleased with how things are progressing. No fevers, chills, nausea, or vomiting noted at this time. And patient is having only minimal discomfort. 05/22/17; the medial wound on the right leg actually looks considerably better and is epithelialized. He still has a small punched out area on the right lateral leg. Edema control is marginal end of the [employer]. We are working on getting him compression pumps 05/29/17; the patient's lymphedema pumps are apparently arriving tonight. In the meantime he took his Unna boots off last Sunday because the edema had progressed to the point that a word to titrate. Since then he has put nothing on his right leg and more specifically nothing on the right medial and lateral wounds 06/05/17; the patient has his lymphedema pumps and he is using them once a day on the right leg. There is a sizable reduction in the circumference of his leg although he arrived in clinic today somewhat short of breath. 07/02/17; since the patient was last in clinic he was admitted on 2 occasions. He was admitted from 9/12 through 9/13 with lactic acidosis weakness and felt to have left lower extremity cellulitis. Due to the elevated lactic acid level he was felt to have sepsis although this was ruled out and the lactic acidosis was felt secondary to his chronic cirrhosis. He was initially treated with IV vancomycin for cellulitis and redness of his left leg but then this was subsequently recognized to be secondary to chronic venous stasis and  chronic lymphedema. When he left the hospital he had Unna boots placed bilaterally. He was readmitted on 9/26 through 9/28 with GI bleeding. His EGD showed Barrett's esophagus and gastritis. He was discharged with a hemoglobin of 8 to follow-up with GI as an outpatient. There was nothing more about his lower extremities during this short hospital stay. The patient has not been using his external compression pumps fearing fluid overload and shortness of breath although he feels better now that his hemoglobin is stabilized. He continues to have epithelial loss on his bilateral lower extremities. 07/09/17; patient has a large but superficial area on the right posterior leg and on the left anterior leg. He tells me he is been using his external compression pumps once a day for the required hour. He states the pumps hurt his right ankle. 07/17/17; patient was transfused a further 2 units since he was here the last time. His hemoglobin was once again found to be low. He has not been able to use his external compression pumps because of a combination of prolonged power outage in his home and I think issues with his other medical care. His wounds have actually worsened he now has loss of epithelialization all the way from the right lateral to the right medial posteriorly. The left side which has anterior loss of epithelialization also appears to be worse on the lateral aspect of the leg where there is denuded epithelium now as well. Leaking edema fluid. He has bilateral lymphedema. 08/12/2017 -- the patient has not been here for  25 days and has had the same compression wraps which have not been changed!! He says he was being worked up for a GI bleed and had some transfusions since he was seen last by Dr. Dellia Nims 08/20/17; mostly by review of what the patient is saying and a quick glance at his records in care everywhere I think this patient probably has Karlene Lineman, cirrhosis and portal hypertension. He is bleeding I  think from gastric area and he is requiring recurrent transfusions. He has been told that there is nothing more that can be done and that his prognosis is poor. He arrived in our office in our office in tears. We've been using silver alginate and 3 layer compression to his bilateral lower extremity wounds. He has significant lymphedema likely secondary to chronic venous insufficiency. He has external compression pumps at home that he had actually before he came to this clinic but he is not using Corey West, Corey West ([id number]) 08/27/17; patient is doing well, wounds on his left leg are tiny one anteriorly and one laterally. He still has substantial wound on the posterior aspect of his right calf extending laterally for over this looks better than last time. He tells me he is able to use his compression pumps once a day on most days. He has his hemoglobin checked on Friday 09-19-17 He arrives today for follow up evaluation. He was unable to come to the clinic last week r/t weather conditions, therefore compression has been in place for two weeks. He presents today with healed LLE and one partial thickness open area to the posterior RL. He states he is experiencing diarrhea secondary to antibiotic that will be stopped next week and has been unable to use lymphedema pumps. Will continue with compression bilaterally, follow up next week with plans to discharge and transition to lymphedema pumps 10/02/17; the patient arrives today with his juxta light stockings bilaterally. He also has external compression pumps at home. There are no open areas on his bilateral legs. He tells me he has had adjustments in his diuretics and had resultant hypokalemia but the amount of edema in his legs is a lot better although he stillhas changes of lymphedema 10/16/17; the patient has been using his juxta lites although they are not put on as high as we might like. He has very superficial excoriations at the top of his wraps but I  don't think that anything to keep him in clinic here. Severe bilateral chronic venous insufficiency with secondary lymphedema. He has no open wound areas Readmission: 11/23/2019 patient presents today for readmission he was last seen here in the clinic on 10/16/2017. With that being said he is unfortunately continue to have issues with lower extremity edema though he was using his lymphedema pumps on a fairly regular basis. Fortunately there is no signs of infection at this time which is good news there is a little bit of erythema that have me wondering about the possibility of infection but again I think this may just be secondary to the inflammation from the lymphedema and the current weeping causing skin breakdown as well. His plantar aspect of his foot on the left lower extremity actually is sloughing off a lot of callus skin externally there is nothing open under this area but nonetheless it does seem to be causing him some difficulty. Fortunately there is no signs of active infection at this time. No fevers, chills, nausea, vomiting, or diarrhea. The patient does have a history of diabetes mellitus type 2, hypertension,  and lymphedema. 12/07/2019 upon evaluation today patient appears to be doing well with regard to the overall appearance of his lower extremities currently. He has been tolerating the dressing changes without complication. Fortunately his infection seems to be doing much better at this point. He has made excellent progress. With that being said there are no signs of active infection systemically at this point. 12/21/2019 upon evaluation today patient appears to be doing slightly better with regard to his lower extremities. Some of the areas are definitely improving from the standpoint of the size of any open/weeping areas. He still has significant lymphedema. He has been in the hospital and apparently was admitted from 12/14/2019 through 12/17/2019. Subsequently he was diagnosed with  cellulitis of the lower extremity along with acute renal failure and he went in initially due to the fact that he was having issues with breathing. He does have congestive heart failure and has had a lot of issues with fluid here. Currently has BNP was 266 on 11/27/2019 and then subsequently at discharge on 12/17/2019 was 256. Looking through his sepsis lab work it appears that he was septic and that he was placed on Cipro 500 mg a day for 10 days secondary to a Klebsiella oxytoca bacteremia. The patient has had a DVT study but still has not had a arterial study with ABI and TBI we probably need to go ahead and see about making this referral back to vascular as we still do not really have a definitive answer at this point. Managed with regard to what his arterial flow is into the lower extremity. 12/28/2019 upon evaluation today patient unfortunately continues to have significant issues with weeping from his left lower extremity. Unfortunately he continues to experience significant issues here with the wraps becoming increasingly saturated. Unfortunately this seems to be related to one area on the anterior/medial portion of his left lower extremity where this is draining significantly and constantly without any signs of slowing. There are couple other open spots on his leg but nothing that is leaking in the way that this is. His heel is severely macerated secondary to the drainage. Home health is changing his wraps on Wednesday and Friday and were doing this on Mondays. Unfortunately I think he really needs a stronger compression wrap but at the moment are really not able to do this for him as were still waiting vascular testing. 01/11/2020 upon evaluation today patient actually appears to be doing significantly better with regard to his bilateral lower extremities with regard to some of the weeping. Nonetheless his right leg does have a lot of blisters as it was not rewrapped after his testing had  vascular. Fortunately there is no signs of active infection at this time. No fevers, chills, nausea, vomiting, or diarrhea. His vascular studies did reveal that he has good arterial flow noted bilaterally there is no signs of obstruction at this point. Overall I am very pleased with how things stand in that regard. 01/24/2026 upon evaluation today patient actually appears to be doing excellent in regard to his lower extremities. In fact I do not see anything which is open, leaking, or blistered at this point. Obviously this is great news. He has not started using his compression pumps/lymphedema pumps yet although he tells me if he gets the okay from me today he will definitely start doing so. Electronic Signature(s) Signed: 01/25/2020 1:31:03 PM By: Worthy Keeler PA-C Entered By: Worthy Keeler on 01/25/2020 13:31:02 Corey West ([id number]) -------------------------------------------------------------------------------- Physical Exam  Details Patient Name: Corey West, Corey West. Date of Service: 01/25/2020 12:30 PM Medical Record Number: [id number] Patient Account Number: [id number] Date of Birth/Sex: Aug 11, 1951 (69 y.o. M) Treating RN: Army Melia Primary Care Provider: [name] Other Clinician: Referring Provider: [name] Treating Provider/Extender: Melburn Hake, Xavier Munger Weeks in Treatment: 9 Constitutional Obese and well-hydrated in no acute distress. Respiratory normal breathing without difficulty. Psychiatric this patient is able to make decisions and demonstrates good insight into disease process. Alert and Oriented x 3. pleasant and cooperative. Notes Upon inspection patient again has no openings of the lower extremities bilaterally there are also no blistered areas his edema is under much better control. He still is having a lot of issues with edema nonetheless and again the goal is can be try to do what we can to prevent this from reopening. Electronic Signature(s) Signed:  01/25/2020 1:31:26 PM By: Worthy Keeler PA-C Entered By: Worthy Keeler on 01/25/2020 13:31:26 Corey West ([id number]) -------------------------------------------------------------------------------- Physician Orders Details Patient Name: Corey West, Corey A. Date of Service: 01/25/2020 12:30 PM Medical Record Number: [id number] Patient Account Number: [id number] Date of Birth/Sex: 09-17-1951 (69 y.o. M) Treating RN: Army Melia Primary Care Provider: [name] Other Clinician: Referring Provider: [name] Treating Provider/Extender: Melburn Hake, Trayson Stitely Weeks in Treatment: 9 Verbal / Phone Orders: No Diagnosis Coding ICD-10 Coding Code Description I89.0 Lymphedema, not elsewhere classified L97.812 Non-pressure chronic ulcer of other part of right lower leg with fat layer exposed L97.822 Non-pressure chronic ulcer of other part of left lower leg with fat layer exposed E11.622 Type 2 diabetes mellitus with other skin ulcer I10 Essential (primary) hypertension Edema Control o Other: - tubi grip f bilateral [place] Nurse may visit PRN to address patientos wound care needs. o FACE TO FACE ENCOUNTER: MEDICARE and MEDICAID PATIENTS: I certify that this patient is under my care and that I had a face-to-face encounter that meets the physician face-to-face encounter requirements with this patient on this date. The encounter with the patient was in whole or in part for the following MEDICAL CONDITION: (primary reason for [place]) MEDICAL NECESSITY: I certify, that based on my findings, NURSING services are a medically necessary home health service. HOME BOUND STATUS: I certify that my clinical findings support that this patient is homebound (i.e., Due to illness or injury, pt requires aid of supportive devices such as crutches, cane, wheelchairs, walkers, the use of special transportation or the assistance of another person to  leave their place of residence. There is a normal inability to leave the home and doing so requires considerable and taxing effort. Other absences are for medical reasons / religious services and are infrequent or of short duration when for other reasons). o If current dressing causes regression in wound condition, may D/C ordered dressing product/s and apply Normal Saline Moist Dressing daily until next Ages / Other MD appointment. [place] of regression in wound condition at [phone]. o Please direct any NON-WOUND related issues/requests for orders to patient's Primary Care Physician Electronic Signature(s) Signed: 01/25/2020 4:20:49 PM By: Army Melia Signed: 01/26/2020 6:46:24 PM By: Worthy Keeler PA-C Entered By: Army Melia on 01/25/2020 13:04:47 Corey West ([id number]) -------------------------------------------------------------------------------- Problem List Details Patient Name: Corey West, Corey A. Date of Service: 01/25/2020 12:30 PM Medical Record Number: [id number] Patient Account Number: [id number] Date of Birth/Sex: 1950/10/12 (69 y.o. M) Treating RN: Army Melia Primary Care Provider: [name] Other Clinician: Referring Provider:  [name] Treating Provider/Extender: Melburn Hake, Auset Fritzler Weeks in Treatment: 9 Active Problems ICD-10 Encounter Code Description Active Date MDM Diagnosis I89.0 Lymphedema, not elsewhere classified 11/23/2019 No Yes L97.812 Non-pressure chronic ulcer of other part of right lower leg with fat layer 11/23/2019 No Yes exposed L97.822 Non-pressure chronic ulcer of other part of left lower leg with fat layer 11/23/2019 No Yes exposed E11.622 Type 2 diabetes mellitus with other skin ulcer 11/23/2019 No Yes I10 Essential (primary) hypertension 11/23/2019 No Yes Inactive Problems Resolved Problems Electronic Signature(s) Signed: 01/25/2020 12:58:17 PM By: Worthy Keeler PA-C Entered By: Worthy Keeler on 01/25/2020 12:58:17 Corey West ([id number]) -------------------------------------------------------------------------------- Progress Note Details Patient Name: Corey Cower A. Date of Service: 01/25/2020 12:30 PM Medical Record Number: [id number] Patient Account Number: [id number] Date of Birth/Sex: 1951/03/03 (69 y.o. M) Treating RN: Army Melia Primary Care Provider: [name] Other Clinician: Referring Provider: [name] Treating Provider/Extender: Melburn Hake, Tamikka Pilger Weeks in Treatment: 9 Subjective Chief Complaint Information obtained from Patient Bilateral LE Ulcers History of Present Illness (HPI) Pleasant 69 year old with a past medical history significant for morbid obesity, insulin-dependent type 2 diabetes (hemoglobin A1c 7.2 in January 2015), cirrhosis, and chronic kidney disease. He reports a left anterior calf ulcer since January 2015. He has had venous stasis ulcers in the past, which have healed spontaneously. No history of DVT. Does not regularly wear compression. Ambulating normally per his baseline. Recently completed a course of antibiotics. Treated with a Profore light and Coban 2 layer compression drainage, which both fell down and he cut off. Tolerated Coban lite compression bandage. Returns to clinic for followup. No complaints today. No pain at this time. No fever or chills. No open wounds at this time. He is having difficulty finding compression garments that fit him. 04/23/17; this is now 69 year old man who returns to our clinic for nonhealing wounds on the right lateral and right posterior calf. He tells me that they have been present for 6 months. In the setting of severe right greater than left lower extremity lymphedema likely secondary to chronic venous insufficiency. He has been followed in the lymphedema clinic at least since early June with compression wraps although these have not really helped and the patient is here for our review of  this situation. The patient has had problems with edema in his legs for years. He has not worn compression. He is type II diabetic that sounds poorly controlled. He also has obstructive sleep apnea and cirrhosis. He is occasional cigar smoker 04/30/17; patient is a 69 year old man who has severe bilateral lower extremity lymphedema stage III since his early 82s he tells me. This is no doubt secondary to chronic venous insufficiency. He was going to the lymphedema clinic who forwarded him here due to skin breakdown in the right leg. He has not previously worn compression stockings. He has a large area of denuded epithelium on the posterior leg. He has a more open wound on the right lateral leg 05/07/17 on evaluation today patient appears to be doing about the same his wounds are showing signs of improvement and looking fairly well. With that being said he is not having any severe discomfort just some mild aching due to the swelling and really this is worse on the left than the right and the right side is where he actually has his wounds. He has been tolerating the [employer] wraps without complication and states this is actually much better than the wraps that he was getting at  the lymphedema clinic most recently. He is happy with these wraps. He has no nausea, vomiting, diarrhea and no fevers or chills. We are still waiting to hear back in regard to the lymphedema pumps which I do think would be beneficial for him even with the compression wraps he still has a significant amount of edema noted of the bilateral lower extremities. 05/15/17 on evaluation today patient's right lower extremity edema and weeping especially the posterior portion appears to be doing better. He still is having some issue here but fortunately it's minimal compared to previous weeks. He continues to tolerate the compression fairly well. Overall he is pleased with how things are progressing. No fevers, chills, nausea, or vomiting noted  at this time. And patient is having only minimal discomfort. 05/22/17; the medial wound on the right leg actually looks considerably better and is epithelialized. He still has a small punched out area on the right lateral leg. Edema control is marginal end of the [employer]. We are working on getting him compression pumps 05/29/17; the patient's lymphedema pumps are apparently arriving tonight. In the meantime he took his Unna boots off last Sunday because the edema had progressed to the point that a word to titrate. Since then he has put nothing on his right leg and more specifically nothing on the right medial and lateral wounds 06/05/17; the patient has his lymphedema pumps and he is using them once a day on the right leg. There is a sizable reduction in the circumference of his leg although he arrived in clinic today somewhat short of breath. 07/02/17; since the patient was last in clinic he was admitted on 2 occasions. He was admitted from 9/12 through 9/13 with lactic acidosis weakness and felt to have left lower extremity cellulitis. Due to the elevated lactic acid level he was felt to have sepsis although this was ruled out and the lactic acidosis was felt secondary to his chronic cirrhosis. He was initially treated with IV vancomycin for cellulitis and redness of his left leg but then this was subsequently recognized to be secondary to chronic venous stasis and chronic lymphedema. When he left the hospital he had Unna boots placed bilaterally. He was readmitted on 9/26 through 9/28 with GI bleeding. His EGD showed Barrett's esophagus and gastritis. He was discharged with a hemoglobin of 8 to follow-up with GI as an outpatient. There was nothing more about his lower extremities during this short hospital stay. The patient has not been using his external compression pumps fearing fluid overload and shortness of breath although he feels better now that his hemoglobin is stabilized. He continues to  have epithelial loss on his bilateral lower extremities. 07/09/17; patient has a large but superficial area on the right posterior leg and on the left anterior leg. He tells me he is been using his external compression pumps once a day for the required hour. He states the pumps hurt his right ankle. 07/17/17; patient was transfused a further 2 units since he was here the last time. His hemoglobin was once again found to be low. He has not been able to use his external compression pumps because of a combination of prolonged power outage in his home and I think issues with his other medical care. His wounds have actually worsened he now has loss of epithelialization all the way from the right lateral to the right medial posteriorly. The left side which has anterior loss of epithelialization also appears to be worse on the lateral  aspect of the leg where there is denuded epithelium now as well. Leaking edema fluid. He has bilateral lymphedema. 08/12/2017 -- the patient has not been here for 25 days and has had the same compression wraps which have not been changed!! He says he was being worked up for a GI bleed and had some transfusions since he was seen last by Dr. Milana Na, Linus Mako ([id number]) 08/20/17; mostly by review of what the patient is saying and a quick glance at his records in care everywhere I think this patient probably has Karlene Lineman, cirrhosis and portal hypertension. He is bleeding I think from gastric area and he is requiring recurrent transfusions. He has been told that there is nothing more that can be done and that his prognosis is poor. He arrived in our office in our office in tears. We've been using silver alginate and 3 layer compression to his bilateral lower extremity wounds. He has significant lymphedema likely secondary to chronic venous insufficiency. He has external compression pumps at home that he had actually before he came to this clinic but he is not using 08/27/17;  patient is doing well, wounds on his left leg are tiny one anteriorly and one laterally. He still has substantial wound on the posterior aspect of his right calf extending laterally for over this looks better than last time. He tells me he is able to use his compression pumps once a day on most days. He has his hemoglobin checked on Friday 09-19-17 He arrives today for follow up evaluation. He was unable to come to the clinic last week r/t weather conditions, therefore compression has been in place for two weeks. He presents today with healed LLE and one partial thickness open area to the posterior RL. He states he is experiencing diarrhea secondary to antibiotic that will be stopped next week and has been unable to use lymphedema pumps. Will continue with compression bilaterally, follow up next week with plans to discharge and transition to lymphedema pumps 10/02/17; the patient arrives today with his juxta light stockings bilaterally. He also has external compression pumps at home. There are no open areas on his bilateral legs. He tells me he has had adjustments in his diuretics and had resultant hypokalemia but the amount of edema in his legs is a lot better although he stillhas changes of lymphedema 10/16/17; the patient has been using his juxta lites although they are not put on as high as we might like. He has very superficial excoriations at the top of his wraps but I don't think that anything to keep him in clinic here. Severe bilateral chronic venous insufficiency with secondary lymphedema. He has no open wound areas Readmission: 11/23/2019 patient presents today for readmission he was last seen here in the clinic on 10/16/2017. With that being said he is unfortunately continue to have issues with lower extremity edema though he was using his lymphedema pumps on a fairly regular basis. Fortunately there is no signs of infection at this time which is good news there is a little bit of erythema  that have me wondering about the possibility of infection but again I think this may just be secondary to the inflammation from the lymphedema and the current weeping causing skin breakdown as well. His plantar aspect of his foot on the left lower extremity actually is sloughing off a lot of callus skin externally there is nothing open under this area but nonetheless it does seem to be causing him some difficulty. Fortunately there  is no signs of active infection at this time. No fevers, chills, nausea, vomiting, or diarrhea. The patient does have a history of diabetes mellitus type 2, hypertension, and lymphedema. 12/07/2019 upon evaluation today patient appears to be doing well with regard to the overall appearance of his lower extremities currently. He has been tolerating the dressing changes without complication. Fortunately his infection seems to be doing much better at this point. He has made excellent progress. With that being said there are no signs of active infection systemically at this point. 12/21/2019 upon evaluation today patient appears to be doing slightly better with regard to his lower extremities. Some of the areas are definitely improving from the standpoint of the size of any open/weeping areas. He still has significant lymphedema. He has been in the hospital and apparently was admitted from 12/14/2019 through 12/17/2019. Subsequently he was diagnosed with cellulitis of the lower extremity along with acute renal failure and he went in initially due to the fact that he was having issues with breathing. He does have congestive heart failure and has had a lot of issues with fluid here. Currently has BNP was 266 on 11/27/2019 and then subsequently at discharge on 12/17/2019 was 256. Looking through his sepsis lab work it appears that he was septic and that he was placed on Cipro 500 mg a day for 10 days secondary to a Klebsiella oxytoca bacteremia. The patient has had a DVT study but still  has not had a arterial study with ABI and TBI we probably need to go ahead and see about making this referral back to vascular as we still do not really have a definitive answer at this point. Managed with regard to what his arterial flow is into the lower extremity. 12/28/2019 upon evaluation today patient unfortunately continues to have significant issues with weeping from his left lower extremity. Unfortunately he continues to experience significant issues here with the wraps becoming increasingly saturated. Unfortunately this seems to be related to one area on the anterior/medial portion of his left lower extremity where this is draining significantly and constantly without any signs of slowing. There are couple other open spots on his leg but nothing that is leaking in the way that this is. His heel is severely macerated secondary to the drainage. Home health is changing his wraps on Wednesday and Friday and were doing this on Mondays. Unfortunately I think he really needs a stronger compression wrap but at the moment are really not able to do this for him as were still waiting vascular testing. 01/11/2020 upon evaluation today patient actually appears to be doing significantly better with regard to his bilateral lower extremities with regard to some of the weeping. Nonetheless his right leg does have a lot of blisters as it was not rewrapped after his testing had vascular. Fortunately there is no signs of active infection at this time. No fevers, chills, nausea, vomiting, or diarrhea. His vascular studies did reveal that he has good arterial flow noted bilaterally there is no signs of obstruction at this point. Overall I am very pleased with how things stand in that regard. 01/24/2026 upon evaluation today patient actually appears to be doing excellent in regard to his lower extremities. In fact I do not see anything which is open, leaking, or blistered at this point. Obviously this is great news.  He has not started using his compression pumps/lymphedema pumps yet although he tells me if he gets the okay from me today he will definitely start  doing so. Objective Constitutional Obese and well-hydrated in no acute distress. Vitals Time Taken: 12:39 PM, Height: 72 in, Weight: 318 lbs, BMI: 43.1, Temperature: 98.6 F, Pulse: 77 bpm, Respiratory Rate: 22 breaths/min, Blood Pressure: 166/42 mmHg. Respiratory normal breathing without difficulty. Corey West, Corey West ([id number]) Psychiatric this patient is able to make decisions and demonstrates good insight into disease process. Alert and Oriented x 3. pleasant and cooperative. General Notes: Upon inspection patient again has no openings of the lower extremities bilaterally there are also no blistered areas his edema is under much better control. He still is having a lot of issues with edema nonetheless and again the goal is can be try to do what we can to prevent this from reopening. Integumentary (Hair, Skin) Wound #10 status is Open. Original cause of wound was Gradually Appeared. The wound is located on the Left,Lateral Lower Leg. The wound measures 0cm length x 0cm width x 0cm depth; 0cm^2 area and 0cm^3 volume. The wound is limited to skin breakdown. There is no tunneling or undermining noted. There is a none present amount of drainage noted. The wound margin is flat and intact. There is no granulation within the wound bed. There is no necrotic tissue within the wound bed. Wound #9 status is Open. Original cause of wound was Gradually Appeared. The wound is located on the Left,Medial Lower Leg. The wound measures 0cm length x 0cm width x 0cm depth; 0cm^2 area and 0cm^3 volume. The wound is limited to skin breakdown. There is no tunneling or undermining noted. There is a none present amount of drainage noted. The wound margin is flat and intact. There is no granulation within the wound bed. There is no necrotic tissue within the wound  bed. Assessment Active Problems ICD-10 Lymphedema, not elsewhere classified Non-pressure chronic ulcer of other part of right lower leg with fat layer exposed Non-pressure chronic ulcer of other part of left lower leg with fat layer exposed Type 2 diabetes mellitus with other skin ulcer Essential (primary) hypertension Plan Edema Control: Other: - tubi grip f bilateral Home Health: [facility] Nurse may visit PRN to address patient s wound care needs. FACE TO FACE ENCOUNTER: MEDICARE and MEDICAID PATIENTS: I certify that this patient is under my care and that I had a face-to-face encounter that meets the physician face-to-face encounter requirements with this patient on this date. The encounter with the patient was in whole or in part for the following MEDICAL CONDITION: (primary reason for [place]) MEDICAL NECESSITY: I certify, that based on my findings, NURSING services are a medically necessary home health service. HOME BOUND STATUS: I certify that my clinical findings support that this patient is homebound (i.e., Due to illness or injury, pt requires aid of supportive devices such as crutches, cane, wheelchairs, walkers, the use of special transportation or the assistance of another person to leave their place of residence. There is a normal inability to leave the home and doing so requires considerable and taxing effort. Other absences are for medical reasons / religious services and are infrequent or of short duration when for other reasons). If current dressing causes regression in wound condition, may D/C ordered dressing product/s and apply Normal Saline Moist Dressing daily until next [place] / Other MD appointment. [place] of regression in wound condition at [phone]. Please direct any NON-WOUND related issues/requests for orders to patient's Primary Care Physician 1. My suggestion at this time is good to be  that we go ahead and continue with lymphedema pumps which I think the patient can reinitiate at this point. 2. I do believe he needs to continue to elevate his legs as much as possible as well we will use Tubigrip at this point he is unable to use compression socks he just cannot get them on by himself and he really has no one to help him. 3. We are going to check with him in 1 week's time to make sure nothing is reopened at that point. The patient felt much more comfortable with that then just completely being discharged at this point. We will see patient back for reevaluation in 1 week here in the clinic. If anything worsens or changes patient will contact our office for additional recommendations. Electronic Signature(s) Signed: 01/25/2020 1:35:09 PM By: Worthy Keeler PA-C Entered By: Worthy Keeler on 01/25/2020 13:35:09 Corey West, Corey West ([id number]) [address], Linus Mako ([id number]) -------------------------------------------------------------------------------- SuperBill Details Patient Name: GYAN, CAMBRE A. Date of Service: 01/25/2020 Medical Record Number: [id number] Patient Account Number: [id number] Date of Birth/Sex: 1951/02/18 (69 y.o. M) Treating RN: Army Melia Primary Care Provider: [name] Other Clinician: Referring Provider: [name] Treating Provider/Extender: Melburn Hake, Attallah Ontko Weeks in Treatment: 9 Diagnosis Coding ICD-10 Codes Code Description I89.0 Lymphedema, not elsewhere classified L97.812 Non-pressure chronic ulcer of other part of right lower leg with fat layer exposed L97.822 Non-pressure chronic ulcer of other part of left lower leg with fat layer exposed E11.622 Type 2 diabetes mellitus with other skin ulcer I10 Essential (primary) hypertension Facility Procedures CPT4 Code: 70623762 Description: 99213 - WOUND CARE VISIT-LEV 3 EST PT Modifier: Quantity: 1 Physician Procedures CPT4 Code: [id number] Description: 99213 - WC PHYS LEVEL 3 - EST  PT Modifier: Quantity: 1 CPT4 Code: Description: ICD-10 Diagnosis Description I89.0 Lymphedema, not elsewhere classified H60.737 Non-pressure chronic ulcer of other part of right lower leg with fat la L97.822 Non-pressure chronic ulcer of other part of left lower leg with fat lay E11.622  Type 2 diabetes mellitus with other skin ulcer Modifier: yer exposed er exposed Quantity: Electronic Signature(s) Signed: 01/25/2020 1:35:25 PM By: Worthy Keeler PA-C Entered By: Worthy Keeler on 01/25/2020 13:35:24

## 2020-01-26 NOTE — Progress Notes (Signed)
EMILO, GRAS ([id number]) Visit Report for 01/25/2020 Arrival Information Details Patient Name: Corey West, Corey West. Date of Service: 01/25/2020 12:30 PM Medical Record [id number] Patient Account Number: [id number] Date of Birth/Sex: 1951-05-09 (69 y.o. M) Treating RN: Montey Hora Primary Care Rilen Shukla: [name] Other Clinician: Referring Andra Matsuo: [name] Treating Rever Pichette/Extender:STONE III, HOYT Weeks in Treatment: 9 Visit Information History Since Last Visit Added or deleted any medications: No Patient Arrived: [place] Any new allergies or adverse reactions: No Arrival Time: 12:38 Had a fall or experienced change in No Accompanied By: self activities of daily living that may affect Transfer Assistance: None risk of falls: Patient Identification Verified: Yes Signs or symptoms of abuse/neglect since last visito No Secondary Verification Process Completed: Yes Hospitalized since last visit: No Implantable device outside of the clinic excluding No cellular tissue based products placed in the center since last visit: Has Dressing in Place as Prescribed: Yes Has Compression in Place as Prescribed: Yes Pain Present Now: No Electronic Signature(s) Signed: 01/25/2020 4:47:11 PM By: Montey Hora Entered By: Montey Hora on 01/25/2020 12:39:09 [name] ([id number]) -------------------------------------------------------------------------------- Clinic Level of Care Assessment Details Patient Name: Corey, [name] Date of Service: 01/25/2020 12:30 PM Medical Record [id number] Patient Account Number: [id number] Date of Birth/Sex: 08-20-1951 (69 y.o. M) Treating RN: Army Melia Primary Care Ivannah Zody: [name] Other Clinician: Referring Osby Sweetin: [name] Treating Jhamir Pickup/Extender:STONE III, HOYT Weeks in Treatment: 9 Clinic Level of Care Assessment Items TOOL 4 Quantity Score []  - Use when only an EandM is performed on FOLLOW-UP visit  0 ASSESSMENTS - Nursing Assessment / Reassessment X - Reassessment of Co-morbidities (includes updates in patient status) 1 10 X - Reassessment of Adherence to Treatment Plan 1 5 ASSESSMENTS - Wound and Skin Assessment / Reassessment []  - Simple Wound Assessment / Reassessment - one wound 0 X - Complex Wound Assessment / Reassessment - multiple wounds 2 5 []  - Dermatologic / Skin Assessment (not related to wound area) 0 ASSESSMENTS - Focused Assessment []  - Circumferential Edema Measurements - multi extremities 0 []  - Nutritional Assessment / Counseling / Intervention 0 []  - Lower Extremity Assessment (monofilament, tuning fork, pulses) 0 []  - Peripheral Arterial Disease Assessment (using hand held doppler) 0 ASSESSMENTS - Ostomy and/or Continence Assessment and Care []  - Incontinence Assessment and Management 0 []  - Ostomy Care Assessment and Management (repouching, etc.) 0 PROCESS - Coordination of Care X - Simple Patient / Family Education for ongoing care 1 15 []  - Complex (extensive) Patient / Family Education for ongoing care 0 []  - Staff obtains Programmer, systems, Records, Test Results / Process Orders 0 []  - Staff telephones HHA, Nursing Homes / Clarify orders / etc 0 []  - Routine Transfer to another Facility (non-emergent condition) 0 []  - Routine Hospital Admission (non-emergent condition) 0 []  - New Admissions / Biomedical engineer / Ordering NPWT, Apligraf, etc. 0 []  - Emergency Hospital Admission (emergent condition) 0 X - Simple Discharge Coordination 1 10 []  - Complex (extensive) Discharge Coordination 0 PROCESS - Special Needs []  - Pediatric / Minor Patient Management 0 []  - Isolation Patient Management 0 []  - Hearing / Language / Visual special needs 0 []  - Assessment of Community assistance (transportation, D/C planning, etc.) 0 []  - Additional assistance / Altered mentation 0 []  - Support Surface(s) Assessment (bed, cushion, seat, etc.) 0 INTERVENTIONS - Wound  Cleansing / Measurement []  - Simple Wound Cleansing - one wound 0 X - Complex Wound Cleansing - multiple wounds 2 5 X - Wound Imaging (  photographs - any number of wounds) 1 5 []  - Wound Tracing (instead of photographs) 0 []  - Simple Wound Measurement - one wound 0 X - Complex Wound Measurement - multiple wounds 2 5 INTERVENTIONS - Wound Dressings X - Small Wound Dressing one or multiple wounds 2 10 []  - Medium Wound Dressing one or multiple wounds 0 []  - Large Wound Dressing one or multiple wounds 0 []  - Application of Medications - topical 0 []  - Application of Medications - injection 0 INTERVENTIONS - Miscellaneous []  - External ear exam 0 []  - Specimen Collection (cultures, biopsies, blood, body fluids, etc.) 0 []  - Specimen(s) / Culture(s) sent or taken to Lab for analysis 0 []  - Patient Transfer (multiple staff / Civil Service fast streamer / Similar devices) 0 []  - Simple Staple / Suture removal (25 or less) 0 []  - Complex Staple / Suture removal (26 or more) 0 []  - Hypo / Hyperglycemic Management (close monitor of Blood Glucose) 0 []  - Ankle / Brachial Index (ABI) - do not check if billed separately 0 X - Vital Signs 1 5 Has the patient been seen at the hospital within the last three years: Yes Total Score: 100 Level Of Care: New/Established - Level 3 Electronic Signature(s) Signed: 01/25/2020 4:20:49 PM By: Army Melia Entered By: Army Melia on 01/25/2020 13:05:36 [name] ([id number]) -------------------------------------------------------------------------------- Encounter Discharge Information Details Patient Name: Corey Cower A. Date of Service: 01/25/2020 12:30 PM Medical Record [id number] Patient Account Number: [id number] Date of Birth/Sex: 01-17-1951 (69 y.o. M) Treating RN: Army Melia Primary Care Jolly Carlini: [name] Other Clinician: Referring Solly Derasmo: [name] Treating Dariusz Brase/Extender:STONE III, HOYT Weeks in Treatment: 9 Encounter Discharge  Information Items Discharge Condition: Stable Ambulatory Status: Ambulatory Discharge Destination: Home Transportation: Private Auto Accompanied By: self Schedule Follow-up Appointment: Yes Clinical Summary of Care: Electronic Signature(s) Signed: 01/25/2020 4:20:49 PM By: Army Melia Entered By: Army Melia on 01/25/2020 13:06:17 [name] ([id number]) -------------------------------------------------------------------------------- Lower Extremity Assessment Details Patient Name: EUGEN, JEANSONNE A. Date of Service: 01/25/2020 12:30 PM Medical Record [id number] Patient Account Number: [id number] Date of Birth/Sex: 1950/11/10 (69 y.o. M) Treating RN: Montey Hora Primary Care Emmersyn Kratzke: [name] Other Clinician: Referring Khamari Sheehan: [name] Treating Gusta Marksberry/Extender:STONE III, HOYT Weeks in Treatment: 9 Edema Assessment Assessed: [Left: No] [Right: No] Edema: [Left: Yes] [Right: Yes] Calf Left: Right: Point of Measurement: 32 cm From Medial Instep 41 cm 45 cm Ankle Left: Right: Point of Measurement: 12 cm From Medial Instep 27.5 cm 26 cm Vascular Assessment Pulses: Dorsalis Pedis Palpable: [Left:Yes] [Right:Yes] Electronic Signature(s) Signed: 01/25/2020 4:47:11 PM By: Montey Hora Entered By: Montey Hora on 01/25/2020 12:47:39 [name] ([id number]) -------------------------------------------------------------------------------- Multi Wound Chart Details Patient Name: Corey Cower A. Date of Service: 01/25/2020 12:30 PM Medical Record [id number] Patient Account Number: [id number] Date of Birth/Sex: 10/07/1950 (69 y.o. M) Treating RN: Army Melia Primary Care Kaylan Yates: [name] Other Clinician: Referring Lauralye Kinn: [name] Treating Duke Weisensel/Extender:STONE III, HOYT Weeks in Treatment: 9 Vital Signs Height(in): 72 Pulse(bpm): 52 Weight(lbs): 318 Blood Pressure(mmHg): 166/42 Body Mass Index(BMI): 43 Temperature(F):  98.6 Respiratory Rate(breaths/min): 22 Photos: [N/A:N/A] Wound Location: Left, Lateral Lower Leg Left, Medial Lower Leg N/A Wounding Event: Gradually Appeared Gradually Appeared N/A Primary Etiology: Lymphedema Lymphedema N/A Comorbid History: Cataracts, Anemia, Sleep Apnea, Cataracts, Anemia, Sleep Apnea, N/A Hypertension, Cirrhosis , Type II Hypertension, Cirrhosis , Type II Diabetes Diabetes Date Acquired: 12/21/2019 12/21/2019 N/A Weeks of Treatment: 5 5 N/A Wound Status: Open Open N/A Measurements L x W x D (cm)  0x0x0 0x0x0 N/A Area (cm) : 0 0 N/A Volume (cm) : 0 0 N/A % Reduction in Area: 100.00% 100.00% N/A % Reduction in Volume: 100.00% 100.00% N/A Classification: Full Thickness Without Exposed Full Thickness Without Exposed N/A Support Structures Support Structures Exudate Amount: None Present None Present N/A Wound Margin: Flat and Intact Flat and Intact N/A Granulation Amount: None Present (0%) None Present (0%) N/A Necrotic Amount: None Present (0%) None Present (0%) N/A Exposed Structures: Fascia: No Fascia: No N/A Fat Layer (Subcutaneous Tissue) Fat Layer (Subcutaneous Tissue) Exposed: No Exposed: No Tendon: No Tendon: No Muscle: No Muscle: No Joint: No Joint: No Bone: No Bone: No Limited to Skin Breakdown Limited to Skin Breakdown Epithelialization: Large (67-100%) Large (67-100%) N/A Treatment Notes Electronic Signature(s) Signed: 01/25/2020 4:20:49 PM By: Army Melia Entered By: Army Melia on 01/25/2020 13:02:11 [name] ([id number]) -------------------------------------------------------------------------------- [place] Details Patient Name: AUTREY, HUMAN A. Date of Service: 01/25/2020 12:30 PM Medical Record [id number] Patient Account Number: [id number] Date of Birth/Sex: 11-04-1950 (69 y.o. M) Treating RN: Army Melia Primary Care Inmer Nix: [name] Other Clinician: Referring Mackenzie Lia: [name] Treating  Lucca Greggs/Extender:STONE III, HOYT Weeks in Treatment: 9 Active Inactive Electronic Signature(s) Signed: 01/25/2020 4:20:49 PM By: Army Melia Entered By: Army Melia on 01/25/2020 13:01:56 [name] ([id number]) -------------------------------------------------------------------------------- Pain Assessment Details Patient Name: [name] Date of Service: 01/25/2020 12:30 PM Medical Record [id number] Patient Account Number: [id number] Date of Birth/Sex: 11/28/1950 (69 y.o. M) Treating RN: Montey Hora Primary Care Tonica Brasington: [name] Other Clinician: Referring Khani Paino: [name] Treating Warner Laduca/Extender:STONE III, HOYT Weeks in Treatment: 9 Active Problems Location of Pain Severity and Description of Pain Patient Has Paino No Site Locations Pain Management and Medication Current Pain Management: Electronic Signature(s) Signed: 01/25/2020 4:47:11 PM By: Montey Hora Entered By: Montey Hora on 01/25/2020 12:39:22 [name] ([id number]) -------------------------------------------------------------------------------- Patient/Caregiver Education Details Patient Name: HAZEM, KENNER A. Date of Service: 01/25/2020 12:30 PM Medical Record [id number] Patient Account Number: [id number] Date of Birth/Gender: 08-23-1951 (69 y.o. M) Treating RN: Army Melia Primary Care Physician: [name] Other Clinician: Referring Physician: [name] Treating Physician/Extender:STONE III, HOYT Weeks in Treatment: 9 Education Assessment Education Provided To: Patient Education Topics Provided Wound/Skin Impairment: Handouts: Caring for Your Ulcer Methods: Demonstration, Explain/Verbal Responses: State content correctly Electronic Signature(s) Signed: 01/25/2020 4:20:49 PM By: Army Melia Entered By: Army Melia on 01/25/2020 13:05:46 [name] ([id number]) -------------------------------------------------------------------------------- Wound  Assessment Details Patient Name: Corey Cower A. Date of Service: 01/25/2020 12:30 PM Medical Record [id number] Patient Account Number: [id number] Date of Birth/Sex: 1950/11/11 (69 y.o. M) Treating RN: Montey Hora Primary Care Hazelle Woollard: [name] Other Clinician: Referring Sydney Hasten: [name] Treating Fatou Dunnigan/Extender:STONE III, HOYT Weeks in Treatment: 9 Wound Status Wound Number: 10 Primary Lymphedema Wound Location: Left, Lateral Lower Leg Etiology: Wounding Event: Gradually Appeared Wound Status: Open Date Acquired: 12/21/2019 Comorbid Cataracts, Anemia, Sleep Apnea, Hypertension, Cirrhosis , Weeks Of Treatment: 5 History: Type II Diabetes Clustered Wound: No Photos Wound Measurements Length: (cm) 0 Width: (cm) 0 Depth: (cm) 0 Area: (cm) Volume: (cm) % Reduction in Area: 100% % Reduction in Volume: 100% Epithelialization: Large (67-100%) 0 Tunneling: No 0 Undermining: No Wound Description Classification: Full Thickness Without Exposed Support Structures Wound Margin: Flat and Intact Exudate Amount: None Present Foul Odor After Cleansing: No Slough/Fibrino No Wound Bed Granulation Amount: None Present (0%) Exposed Structure Necrotic Amount: None Present (0%) Fascia Exposed: No Fat Layer (Subcutaneous Tissue) Exposed: No Tendon Exposed: No Muscle Exposed: No Joint  Exposed: No Bone Exposed: No Limited to Skin Breakdown Electronic Signature(s) Signed: 01/25/2020 4:47:11 PM By: Montey Hora Entered By: Montey Hora on 01/25/2020 12:52:34 [name] ([id number]) -------------------------------------------------------------------------------- Wound Assessment Details Patient Name: Corey Cower A. Date of Service: 01/25/2020 12:30 PM Medical Record [id number] Patient Account Number: [id number] Date of Birth/Sex: 01-20-1951 (69 y.o. M) Treating RN: Montey Hora Primary Care Jazmina Muhlenkamp: [name] Other Clinician: Referring Keelyn Fjelstad:  [name] Treating Aseem Sessums/Extender:STONE III, HOYT Weeks in Treatment: 9 Wound Status Wound Number: 9 Primary Lymphedema Wound Location: Left, Medial Lower Leg Etiology: Wounding Event: Gradually Appeared Wound Status: Open Date Acquired: 12/21/2019 Comorbid Cataracts, Anemia, Sleep Apnea, Hypertension, Cirrhosis , Weeks Of Treatment: 5 History: Type II Diabetes Clustered Wound: No Photos Wound Measurements Length: (cm) 0 Width: (cm) 0 Depth: (cm) 0 Area: (cm) Volume: (cm) % Reduction in Area: 100% % Reduction in Volume: 100% Epithelialization: Large (67-100%) 0 Tunneling: No 0 Undermining: No Wound Description Classification: Full Thickness Without Exposed Support Structures Wound Margin: Flat and Intact Exudate Amount: None Present Foul Odor After Cleansing: No Slough/Fibrino No Wound Bed Granulation Amount: None Present (0%) Exposed Structure Necrotic Amount: None Present (0%) Fascia Exposed: No Fat Layer (Subcutaneous Tissue) Exposed: No Tendon Exposed: No Muscle Exposed: No Joint Exposed: No Bone Exposed: No Limited to Skin Breakdown Electronic Signature(s) Signed: 01/25/2020 4:47:11 PM By: Montey Hora Entered By: Montey Hora on 01/25/2020 12:53:01 [name] ([id number]) -------------------------------------------------------------------------------- [place] Details Patient Name: Corey Cower A. Date of Service: 01/25/2020 12:30 PM Medical Record [id number] Patient Account Number: [id number] Date of Birth/Sex: Apr 13, 1951 (69 y.o. M) Treating RN: Montey Hora Primary Care Jenna Routzahn: [name] Other Clinician: Referring Fredrick Geoghegan: [name] Treating Rui Wordell/Extender:STONE III, HOYT Weeks in Treatment: 9 Vital Signs Time Taken: 12:39 Temperature (F): 98.6 Height (in): 72 Pulse (bpm): 77 Weight (lbs): 318 Respiratory Rate (breaths/min): 22 Body Mass Index (BMI): 43.1 Blood Pressure (mmHg): 166/42 Reference Range: 80 - 120  mg / dl Electronic Signature(s) Signed: 01/25/2020 4:47:11 PM By: Montey Hora Entered By: Montey Hora on 01/25/2020 12:43:55

## 2020-01-28 ENCOUNTER — Emergency Department: Payer: Medicare Other

## 2020-01-28 ENCOUNTER — Other Ambulatory Visit: Payer: Self-pay

## 2020-01-28 ENCOUNTER — Inpatient Hospital Stay
Admission: EM | Admit: 2020-01-28 | Discharge: 2020-02-12 | DRG: 291 | Disposition: A | Discharge status: Skilled Nursing Facility | Payer: Medicare Other | Attending: Internal Medicine | Admitting: Internal Medicine

## 2020-01-28 DIAGNOSIS — I82409 Acute embolism and thrombosis of unspecified deep veins of unspecified lower extremity: Secondary | ICD-10-CM

## 2020-01-28 DIAGNOSIS — N189 Chronic kidney disease, unspecified: Secondary | ICD-10-CM

## 2020-01-28 DIAGNOSIS — E66813 Obesity, class 3: Secondary | ICD-10-CM

## 2020-01-28 DIAGNOSIS — F1010 Alcohol abuse, uncomplicated: Secondary | ICD-10-CM | POA: Diagnosis present

## 2020-01-28 DIAGNOSIS — E8779 Other fluid overload: Secondary | ICD-10-CM

## 2020-01-28 DIAGNOSIS — N5089 Other specified disorders of the male genital organs: Secondary | ICD-10-CM

## 2020-01-28 DIAGNOSIS — N179 Acute kidney failure, unspecified: Secondary | ICD-10-CM

## 2020-01-28 DIAGNOSIS — E876 Hypokalemia: Secondary | ICD-10-CM | POA: Diagnosis not present

## 2020-01-28 DIAGNOSIS — I130 Hypertensive heart and chronic kidney disease with heart failure and stage 1 through stage 4 chronic kidney disease, or unspecified chronic kidney disease: Principal | ICD-10-CM | POA: Diagnosis present

## 2020-01-28 DIAGNOSIS — K7460 Unspecified cirrhosis of liver: Secondary | ICD-10-CM

## 2020-01-28 DIAGNOSIS — E8770 Fluid overload, unspecified: Secondary | ICD-10-CM | POA: Diagnosis present

## 2020-01-28 DIAGNOSIS — R7989 Other specified abnormal findings of blood chemistry: Secondary | ICD-10-CM

## 2020-01-28 DIAGNOSIS — R441 Visual hallucinations: Secondary | ICD-10-CM | POA: Diagnosis present

## 2020-01-28 DIAGNOSIS — R0601 Orthopnea: Secondary | ICD-10-CM

## 2020-01-28 DIAGNOSIS — K219 Gastro-esophageal reflux disease without esophagitis: Secondary | ICD-10-CM | POA: Diagnosis present

## 2020-01-28 DIAGNOSIS — I8510 Secondary esophageal varices without bleeding: Secondary | ICD-10-CM | POA: Diagnosis present

## 2020-01-28 DIAGNOSIS — I872 Venous insufficiency (chronic) (peripheral): Secondary | ICD-10-CM | POA: Diagnosis present

## 2020-01-28 DIAGNOSIS — Z833 Family history of diabetes mellitus: Secondary | ICD-10-CM

## 2020-01-28 DIAGNOSIS — I152 Hypertension secondary to endocrine disorders: Secondary | ICD-10-CM | POA: Diagnosis present

## 2020-01-28 DIAGNOSIS — I5033 Acute on chronic diastolic (congestive) heart failure: Secondary | ICD-10-CM | POA: Diagnosis present

## 2020-01-28 DIAGNOSIS — Z8249 Family history of ischemic heart disease and other diseases of the circulatory system: Secondary | ICD-10-CM

## 2020-01-28 DIAGNOSIS — E1121 Type 2 diabetes mellitus with diabetic nephropathy: Secondary | ICD-10-CM

## 2020-01-28 DIAGNOSIS — G4733 Obstructive sleep apnea (adult) (pediatric): Secondary | ICD-10-CM | POA: Diagnosis present

## 2020-01-28 DIAGNOSIS — K766 Portal hypertension: Secondary | ICD-10-CM | POA: Diagnosis present

## 2020-01-28 DIAGNOSIS — N432 Other hydrocele: Secondary | ICD-10-CM | POA: Diagnosis present

## 2020-01-28 DIAGNOSIS — D62 Acute posthemorrhagic anemia: Secondary | ICD-10-CM | POA: Diagnosis not present

## 2020-01-28 DIAGNOSIS — K7200 Acute and subacute hepatic failure without coma: Secondary | ICD-10-CM | POA: Diagnosis present

## 2020-01-28 DIAGNOSIS — E11649 Type 2 diabetes mellitus with hypoglycemia without coma: Secondary | ICD-10-CM | POA: Diagnosis present

## 2020-01-28 DIAGNOSIS — R001 Bradycardia, unspecified: Secondary | ICD-10-CM | POA: Diagnosis present

## 2020-01-28 DIAGNOSIS — I10 Essential (primary) hypertension: Secondary | ICD-10-CM | POA: Diagnosis present

## 2020-01-28 DIAGNOSIS — E785 Hyperlipidemia, unspecified: Secondary | ICD-10-CM | POA: Diagnosis present

## 2020-01-28 DIAGNOSIS — J9621 Acute and chronic respiratory failure with hypoxia: Secondary | ICD-10-CM | POA: Diagnosis present

## 2020-01-28 DIAGNOSIS — D696 Thrombocytopenia, unspecified: Secondary | ICD-10-CM | POA: Diagnosis present

## 2020-01-28 DIAGNOSIS — Z823 Family history of stroke: Secondary | ICD-10-CM

## 2020-01-28 DIAGNOSIS — D649 Anemia, unspecified: Secondary | ICD-10-CM

## 2020-01-28 DIAGNOSIS — M7989 Other specified soft tissue disorders: Secondary | ICD-10-CM

## 2020-01-28 DIAGNOSIS — E1122 Type 2 diabetes mellitus with diabetic chronic kidney disease: Secondary | ICD-10-CM | POA: Diagnosis present

## 2020-01-28 DIAGNOSIS — D631 Anemia in chronic kidney disease: Secondary | ICD-10-CM | POA: Diagnosis present

## 2020-01-28 DIAGNOSIS — Z20822 Contact with and (suspected) exposure to covid-19: Secondary | ICD-10-CM | POA: Diagnosis present

## 2020-01-28 DIAGNOSIS — R0602 Shortness of breath: Secondary | ICD-10-CM

## 2020-01-28 DIAGNOSIS — Z79899 Other long term (current) drug therapy: Secondary | ICD-10-CM

## 2020-01-28 DIAGNOSIS — E1140 Type 2 diabetes mellitus with diabetic neuropathy, unspecified: Secondary | ICD-10-CM | POA: Diagnosis present

## 2020-01-28 DIAGNOSIS — K7682 Hepatic encephalopathy: Secondary | ICD-10-CM

## 2020-01-28 DIAGNOSIS — Z87891 Personal history of nicotine dependence: Secondary | ICD-10-CM

## 2020-01-28 DIAGNOSIS — I482 Chronic atrial fibrillation, unspecified: Secondary | ICD-10-CM | POA: Diagnosis present

## 2020-01-28 DIAGNOSIS — Z6841 Body Mass Index (BMI) 40.0 and over, adult: Secondary | ICD-10-CM

## 2020-01-28 DIAGNOSIS — E1169 Type 2 diabetes mellitus with other specified complication: Secondary | ICD-10-CM

## 2020-01-28 DIAGNOSIS — F419 Anxiety disorder, unspecified: Secondary | ICD-10-CM | POA: Diagnosis present

## 2020-01-28 DIAGNOSIS — E1159 Type 2 diabetes mellitus with other circulatory complications: Secondary | ICD-10-CM | POA: Diagnosis present

## 2020-01-28 DIAGNOSIS — K7581 Nonalcoholic steatohepatitis (NASH): Secondary | ICD-10-CM | POA: Diagnosis present

## 2020-01-28 DIAGNOSIS — R238 Other skin changes: Secondary | ICD-10-CM

## 2020-01-28 DIAGNOSIS — D6959 Other secondary thrombocytopenia: Secondary | ICD-10-CM | POA: Diagnosis present

## 2020-01-28 DIAGNOSIS — K319 Disease of stomach and duodenum, unspecified: Secondary | ICD-10-CM | POA: Diagnosis present

## 2020-01-28 DIAGNOSIS — I5032 Chronic diastolic (congestive) heart failure: Secondary | ICD-10-CM | POA: Diagnosis present

## 2020-01-28 DIAGNOSIS — Y909 Presence of alcohol in blood, level not specified: Secondary | ICD-10-CM | POA: Diagnosis present

## 2020-01-28 DIAGNOSIS — K7031 Alcoholic cirrhosis of liver with ascites: Secondary | ICD-10-CM | POA: Diagnosis present

## 2020-01-28 DIAGNOSIS — J9601 Acute respiratory failure with hypoxia: Secondary | ICD-10-CM | POA: Diagnosis present

## 2020-01-28 DIAGNOSIS — Z794 Long term (current) use of insulin: Secondary | ICD-10-CM

## 2020-01-28 DIAGNOSIS — N184 Chronic kidney disease, stage 4 (severe): Secondary | ICD-10-CM

## 2020-01-28 LAB — COMPREHENSIVE METABOLIC PANEL
ALT: 29 U/L (ref 0–44)
AST: 35 U/L (ref 15–41)
Albumin: 3 g/dL — ABNORMAL LOW (ref 3.5–5.0)
Alkaline Phosphatase: 89 U/L (ref 38–126)
Anion gap: 6 (ref 5–15)
BUN: 42 mg/dL — ABNORMAL HIGH (ref 8–23)
CO2: 23 mmol/L (ref 22–32)
Calcium: 9.2 mg/dL (ref 8.9–10.3)
Chloride: 113 mmol/L — ABNORMAL HIGH (ref 98–111)
Creatinine, Ser: 2.43 mg/dL — ABNORMAL HIGH (ref 0.61–1.24)
GFR calc Af Amer: 31 mL/min — ABNORMAL LOW (ref >60)
GFR calc non Af Amer: 27 mL/min — ABNORMAL LOW (ref >60)
Glucose, Bld: 61 mg/dL — ABNORMAL LOW (ref 70–99)
Potassium: 4.9 mmol/L (ref 3.5–5.1)
Sodium: 142 mmol/L (ref 135–145)
Total Bilirubin: 0.9 mg/dL (ref 0.3–1.2)
Total Protein: 7.3 g/dL (ref 6.5–8.1)

## 2020-01-28 LAB — URINALYSIS, COMPLETE (UACMP) WITH MICROSCOPIC
Bacteria, UA: NONE SEEN
Bilirubin Urine: NEGATIVE
Glucose, UA: NEGATIVE mg/dL
Hgb urine dipstick: NEGATIVE
Ketones, ur: NEGATIVE mg/dL
Leukocytes,Ua: NEGATIVE
Nitrite: NEGATIVE
Protein, ur: 30 mg/dL — AB
Specific Gravity, Urine: 1.006 (ref 1.005–1.030)
pH: 5 (ref 5.0–8.0)

## 2020-01-28 LAB — GLUCOSE, CAPILLARY
Glucose-Capillary: 72 mg/dL (ref 70–99)
Glucose-Capillary: 94 mg/dL (ref 70–99)
Glucose-Capillary: 82 mg/dL (ref 70–99)

## 2020-01-28 LAB — CBC
HCT: 25.6 % — ABNORMAL LOW (ref 39.0–52.0)
Hemoglobin: 7.8 g/dL — ABNORMAL LOW (ref 13.0–17.0)
MCH: 28.9 pg (ref 26.0–34.0)
MCHC: 30.5 g/dL (ref 30.0–36.0)
MCV: 94.8 fL (ref 80.0–100.0)
Platelets: 122 10*3/uL — ABNORMAL LOW (ref 150–400)
RBC: 2.7 MIL/uL — ABNORMAL LOW (ref 4.22–5.81)
RDW: 15.2 % (ref 11.5–15.5)
WBC: 4.7 10*3/uL (ref 4.0–10.5)
nRBC: 0 % (ref 0.0–0.2)

## 2020-01-28 LAB — PROTIME-INR
INR: 1.2 (ref 0.8–1.2)
Prothrombin Time: 14.8 seconds (ref 11.4–15.2)

## 2020-01-28 LAB — BRAIN NATRIURETIC PEPTIDE: B Natriuretic Peptide: 217 pg/mL — ABNORMAL HIGH (ref 0.0–100.0)

## 2020-01-28 LAB — RESPIRATORY PANEL BY RT PCR (FLU A&B, COVID)
Influenza A by PCR: NEGATIVE
Influenza B by PCR: NEGATIVE
SARS Coronavirus 2 by RT PCR: NEGATIVE

## 2020-01-28 LAB — AMMONIA: Ammonia: 70 umol/L — ABNORMAL HIGH (ref 9–35)

## 2020-01-28 MED ORDER — ONDANSETRON HCL 4 MG PO TABS
4.0000 mg | ORAL_TABLET | Freq: Four times a day (QID) | ORAL | Status: DC | PRN
  Administered 2020-02-06: 07:00:00 4 mg via ORAL
  Filled 2020-01-28: qty 1

## 2020-01-28 MED ORDER — TRAMADOL HCL 50 MG PO TABS
50.0000 mg | ORAL_TABLET | Freq: Four times a day (QID) | ORAL | Status: DC | PRN
  Administered 2020-01-29 – 2020-02-11 (×13): 50 mg via ORAL
  Filled 2020-01-28 (×13): qty 1

## 2020-01-28 MED ORDER — LORAZEPAM 0.5 MG PO TABS
0.5000 mg | ORAL_TABLET | Freq: Three times a day (TID) | ORAL | Status: DC | PRN
  Administered 2020-01-29 – 2020-02-10 (×15): 0.5 mg via ORAL
  Filled 2020-01-28 (×18): qty 1

## 2020-01-28 MED ORDER — ONDANSETRON HCL 4 MG/2ML IJ SOLN
4.0000 mg | Freq: Four times a day (QID) | INTRAMUSCULAR | Status: DC | PRN
  Administered 2020-02-03 – 2020-02-08 (×3): 4 mg via INTRAVENOUS
  Filled 2020-01-28 (×3): qty 2

## 2020-01-28 MED ORDER — ENOXAPARIN SODIUM 40 MG/0.4ML ~~LOC~~ SOLN
40.0000 mg | Freq: Two times a day (BID) | SUBCUTANEOUS | Status: DC
  Administered 2020-01-29 – 2020-02-02 (×9): 40 mg via SUBCUTANEOUS
  Filled 2020-01-28 (×9): qty 0.4

## 2020-01-28 MED ORDER — LACTULOSE 10 GM/15ML PO SOLN
30.0000 g | Freq: Four times a day (QID) | ORAL | Status: DC
  Administered 2020-01-28 – 2020-01-31 (×9): 30 g via ORAL
  Filled 2020-01-28 (×9): qty 60

## 2020-01-28 MED ORDER — ATORVASTATIN CALCIUM 20 MG PO TABS
20.0000 mg | ORAL_TABLET | Freq: Every day | ORAL | Status: DC
  Administered 2020-01-29 – 2020-02-11 (×14): 20 mg via ORAL
  Filled 2020-01-28 (×14): qty 1

## 2020-01-28 MED ORDER — POTASSIUM CHLORIDE CRYS ER 20 MEQ PO TBCR
20.0000 meq | EXTENDED_RELEASE_TABLET | Freq: Two times a day (BID) | ORAL | Status: DC
  Administered 2020-01-28 – 2020-01-29 (×2): 20 meq via ORAL
  Filled 2020-01-28 (×2): qty 1

## 2020-01-28 MED ORDER — HYDRALAZINE HCL 25 MG PO TABS
25.0000 mg | ORAL_TABLET | Freq: Three times a day (TID) | ORAL | Status: DC
  Administered 2020-01-28 – 2020-02-12 (×43): 25 mg via ORAL
  Filled 2020-01-28 (×43): qty 1

## 2020-01-28 MED ORDER — SODIUM CHLORIDE 0.9% FLUSH: 3.0000 mL | Freq: Once | INTRAVENOUS | Status: DC

## 2020-01-28 MED ORDER — FUROSEMIDE 10 MG/ML IJ SOLN
60.0000 mg | Freq: Two times a day (BID) | INTRAMUSCULAR | Status: DC
  Administered 2020-01-29: 60 mg via INTRAVENOUS
  Filled 2020-01-28: qty 8

## 2020-01-28 MED ORDER — FUROSEMIDE 10 MG/ML IJ SOLN
60.0000 mg | Freq: Once | INTRAMUSCULAR | Status: AC
  Administered 2020-01-28: 21:00:00 60 mg via INTRAVENOUS
  Filled 2020-01-28: qty 8

## 2020-01-28 MED ORDER — INSULIN ASPART 100 UNIT/ML ~~LOC~~ SOLN
0.0000 [IU] | Freq: Three times a day (TID) | SUBCUTANEOUS | Status: DC
  Administered 2020-01-29: 1 [IU] via SUBCUTANEOUS
  Administered 2020-01-29: 2 [IU] via SUBCUTANEOUS
  Administered 2020-01-30: 3 [IU] via SUBCUTANEOUS
  Administered 2020-01-30: 2 [IU] via SUBCUTANEOUS
  Administered 2020-01-31: 1 [IU] via SUBCUTANEOUS
  Administered 2020-01-31: 3 [IU] via SUBCUTANEOUS
  Administered 2020-01-31: 2 [IU] via SUBCUTANEOUS
  Administered 2020-02-01 (×2): 1 [IU] via SUBCUTANEOUS
  Administered 2020-02-01 – 2020-02-02 (×2): 2 [IU] via SUBCUTANEOUS
  Administered 2020-02-02: 1 [IU] via SUBCUTANEOUS
  Administered 2020-02-02 – 2020-02-04 (×4): 2 [IU] via SUBCUTANEOUS
  Administered 2020-02-04: 17:00:00 3 [IU] via SUBCUTANEOUS
  Administered 2020-02-04 – 2020-02-05 (×2): 2 [IU] via SUBCUTANEOUS
  Administered 2020-02-05: 12:00:00 5 [IU] via SUBCUTANEOUS
  Administered 2020-02-05: 18:00:00 3 [IU] via SUBCUTANEOUS
  Administered 2020-02-06 – 2020-02-07 (×4): 2 [IU] via SUBCUTANEOUS
  Administered 2020-02-07: 17:00:00 3 [IU] via SUBCUTANEOUS
  Administered 2020-02-07: 13:00:00 5 [IU] via SUBCUTANEOUS
  Administered 2020-02-08 (×2): 2 [IU] via SUBCUTANEOUS
  Administered 2020-02-08: 17:00:00 3 [IU] via SUBCUTANEOUS
  Administered 2020-02-09: 2 [IU] via SUBCUTANEOUS
  Administered 2020-02-09 (×2): 3 [IU] via SUBCUTANEOUS
  Administered 2020-02-10: 08:00:00 2 [IU] via SUBCUTANEOUS
  Administered 2020-02-10: 17:00:00 5 [IU] via SUBCUTANEOUS
  Administered 2020-02-10: 3 [IU] via SUBCUTANEOUS
  Administered 2020-02-11: 2 [IU] via SUBCUTANEOUS
  Filled 2020-01-28 (×37): qty 1

## 2020-01-28 MED ORDER — PANTOPRAZOLE SODIUM 40 MG PO TBEC
40.0000 mg | DELAYED_RELEASE_TABLET | Freq: Two times a day (BID) | ORAL | Status: DC
  Administered 2020-01-28 – 2020-02-04 (×14): 40 mg via ORAL
  Filled 2020-01-28 (×14): qty 1

## 2020-01-28 NOTE — ED Notes (Signed)
Pt back form [place] to room 17

## 2020-01-28 NOTE — ED Notes (Signed)
ED Provider Monks at bedside.

## 2020-01-28 NOTE — ED Notes (Signed)
Assisted pt to toilet in triage.

## 2020-01-28 NOTE — ED Notes (Signed)
Pt to US.

## 2020-01-28 NOTE — ED Notes (Signed)
Pt to CT

## 2020-01-28 NOTE — ED Notes (Signed)
Pt provided with PB and OJ.

## 2020-01-28 NOTE — H&P (Signed)
History and Physical    Corey West:[id number] DOB: [birth year] DOA: 01/28/2020  PCP: System, Pcp Not In  Patient coming from: Home  I have personally briefly reviewed patient's old medical records in [place]  Chief Complaint: Volume overload  HPI: Corey West is a 69 y.o. 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  Patient reports several days of progressive increase edema in bilateral lower extremities and scrotum.  He has been having associated shortness of breath.  He says he had visual hallucinations today but denies any confusion.  He has been taking lactulose with last bowel movement yesterday however states he did not take his lactulose today due to his significant scrotal discomfort.  He also notes that previously loosefitting pants are now tight on him.  He reports good urine output.  He denies any subjective fevers, chills, diaphoresis, chest pain.  ED Course:  Initial vitals showed BP 145/54, pulse 71, RR 20, temp 97.8 Fahrenheit, SPO2 99% on room air.  Labs are notable for sodium 142, potassium 4.9, bicarb 23, BUN 42, creatinine 2.43, LFTs within normal limits, serum glucose 61, WBC 4.7, hemoglobin 7.8, platelets 122,000, BNP 217, ammonia 70, INR 1.2.  Urinalysis is negative for UTI.  CT head without contrast is negative for evidence of acute infarction, hemorrhage, hydrocephalus, or mass lesion/mass-effect.  Portable chest x-ray shows pulmonary vascular congestion.  Scrotal ultrasound shows diffuse scrotal edema and chronic large right-sided hydrocele.  Small hypoechoic focus within the right testicle appears decreased in size when compared to prior  exam in 2014.  Patient is ordered to receive IV Lasix 60 mg once.  The hospitalist service was consulted to admit for further evaluation and management.  Review of Systems: All systems reviewed and are negative except as documented in history of present illness above.   Past Medical History:  Diagnosis Date  . Anemia   . Anxiety    controlled;   . Arthritis   . AVM (arteriovenous malformation) of stomach, acquired with hemorrhage   . Barrett's esophagus   . Chronic kidney disease    renal infufficiency  . Cirrhosis ([place])   . Depression    controlled;   Marland Kitchen Diabetes mellitus without complication ([place])    not controlled, taking insulin but sugar continues to run high;   . Edema   . Esophageal varices ([place])   . GAVE (gastric antral vascular ectasia)   . GERD (gastroesophageal reflux disease)   . History of hiatal hernia   . Hyperlipidemia   . Hypertension    controlled well;   . Liver cirrhosis ([place])   . Nephropathy, diabetic ([place])   . Obesity   . Pancytopenia ([place])   . Polyp, stomach    with chronic blood loss  . Sleep apnea    does not wear a cpap, Medicare would not pay for it   . Venous stasis dermatitis of both lower extremities   . Venous stasis of both lower extremities    with cellulitis    Past Surgical History:  Procedure Laterality Date  . ESOPHAGOGASTRODUODENOSCOPY N/A 02/09/2015   Procedure: ESOPHAGOGASTRODUODENOSCOPY (EGD);  Surgeon: Manya Silvas, MD;  Location: [facility] ENDOSCOPY;  Service: Endoscopy;  Laterality: N/A;  . ESOPHAGOGASTRODUODENOSCOPY N/A 07/22/2015   Procedure: ESOPHAGOGASTRODUODENOSCOPY (  EGD);  Surgeon: Manya Silvas, MD;  Location: [facility] ENDOSCOPY;  Service: Endoscopy;  Laterality: N/A;  . ESOPHAGOGASTRODUODENOSCOPY N/A 06/26/2017   Procedure: ESOPHAGOGASTRODUODENOSCOPY (EGD);  Surgeon: Manya Silvas, MD;  Location: [facility] ENDOSCOPY;  Service: Endoscopy;  Laterality: N/A;  . ESOPHAGOGASTRODUODENOSCOPY N/A 06/27/2017   Procedure:  ESOPHAGOGASTRODUODENOSCOPY (EGD);  Surgeon: Manya Silvas, MD;  Location: [facility] ENDOSCOPY;  Service: Endoscopy;  Laterality: N/A;  . ESOPHAGOGASTRODUODENOSCOPY N/A 06/28/2017   Procedure: ESOPHAGOGASTRODUODENOSCOPY (EGD);  Surgeon: Manya Silvas, MD;  Location: [facility] ENDOSCOPY;  Service: Endoscopy;  Laterality: N/A;  . ESOPHAGOGASTRODUODENOSCOPY Bilateral 10/11/2017   Procedure: ESOPHAGOGASTRODUODENOSCOPY (EGD);  Surgeon: Manya Silvas, MD;  Location: [facility] ENDOSCOPY;  Service: Endoscopy;  Laterality: Bilateral;  . ESOPHAGOGASTRODUODENOSCOPY N/A 12/04/2017   Procedure: ESOPHAGOGASTRODUODENOSCOPY (EGD);  Surgeon: Manya Silvas, MD;  Location: [facility] ENDOSCOPY;  Service: Endoscopy;  Laterality: N/A;  . ESOPHAGOGASTRODUODENOSCOPY N/A 03/06/2019   Procedure: ESOPHAGOGASTRODUODENOSCOPY (EGD);  Surgeon: Toledo, Benay Pike, MD;  Location: [facility] ENDOSCOPY;  Service: Gastroenterology;  Laterality: N/A;  . ESOPHAGOGASTRODUODENOSCOPY (EGD) WITH PROPOFOL N/A 07/20/2015   Procedure: ESOPHAGOGASTRODUODENOSCOPY (EGD) WITH PROPOFOL;  Surgeon: Manya Silvas, MD;  Location: [facility] ENDOSCOPY;  Service: Endoscopy;  Laterality: N/A;  . ESOPHAGOGASTRODUODENOSCOPY (EGD) WITH PROPOFOL N/A 09/16/2015   Procedure: ESOPHAGOGASTRODUODENOSCOPY (EGD) WITH PROPOFOL;  Surgeon: Manya Silvas, MD;  Location: [facility] ENDOSCOPY;  Service: Endoscopy;  Laterality: N/A;  . ESOPHAGOGASTRODUODENOSCOPY (EGD) WITH PROPOFOL N/A 03/16/2016   Procedure: ESOPHAGOGASTRODUODENOSCOPY (EGD) WITH PROPOFOL;  Surgeon: Manya Silvas, MD;  Location: [facility] ENDOSCOPY;  Service: Endoscopy;  Laterality: N/A;  . ESOPHAGOGASTRODUODENOSCOPY (EGD) WITH PROPOFOL N/A 09/14/2016   Procedure: ESOPHAGOGASTRODUODENOSCOPY (EGD) WITH PROPOFOL;  Surgeon: Manya Silvas, MD;  Location: [facility] ENDOSCOPY;  Service: Endoscopy;  Laterality: N/A;  . ESOPHAGOGASTRODUODENOSCOPY (EGD) WITH PROPOFOL N/A 11/05/2016   Procedure: ESOPHAGOGASTRODUODENOSCOPY (EGD) WITH PROPOFOL;   Surgeon: Manya Silvas, MD;  Location: [facility] ENDOSCOPY;  Service: Endoscopy;  Laterality: N/A;  . ESOPHAGOGASTRODUODENOSCOPY (EGD) WITH PROPOFOL N/A 02/06/2017   Procedure: ESOPHAGOGASTRODUODENOSCOPY (EGD) WITH PROPOFOL;  Surgeon: Manya Silvas, MD;  Location: [facility] ENDOSCOPY;  Service: Endoscopy;  Laterality: N/A;  . ESOPHAGOGASTRODUODENOSCOPY (EGD) WITH PROPOFOL N/A 04/24/2017   Procedure: ESOPHAGOGASTRODUODENOSCOPY (EGD) WITH PROPOFOL;  Surgeon: Manya Silvas, MD;  Location: [facility] ENDOSCOPY;  Service: Endoscopy;  Laterality: N/A;  . ESOPHAGOGASTRODUODENOSCOPY (EGD) WITH PROPOFOL N/A 07/31/2017   Procedure: ESOPHAGOGASTRODUODENOSCOPY (EGD) WITH PROPOFOL;  Surgeon: Manya Silvas, MD;  Location: [facility] ENDOSCOPY;  Service: Endoscopy;  Laterality: N/A;  . ESOPHAGOGASTRODUODENOSCOPY (EGD) WITH PROPOFOL N/A 08/08/2018   Procedure: ESOPHAGOGASTRODUODENOSCOPY (EGD) WITH PROPOFOL;  Surgeon: Manya Silvas, MD;  Location: [facility] ENDOSCOPY;  Service: Endoscopy;  Laterality: N/A;  . GIVENS CAPSULE STUDY N/A 12/05/2017   Procedure: GIVENS CAPSULE STUDY;  Surgeon: Manya Silvas, MD;  Location: [facility] ENDOSCOPY;  Service: Endoscopy;  Laterality: N/A;  . TONSILLECTOMY    . TONSILLECTOMY AND ADENOIDECTOMY    . ULNAR NERVE TRANSPOSITION    . UVULOPALATOPHARYNGOPLASTY      Social History:  reports that he has quit smoking. His smoking use included cigars and cigarettes. He quit after 20.00 years of use. He has never used smokeless tobacco. He reports that he does not drink alcohol or use drugs.  No Known Allergies  Family History  Problem Relation Age of Onset  . Diabetes Other   . Transient ischemic attack Father   . CAD Father      Prior to Admission medications   Medication Sig Start Date End Date Taking? Authorizing Provider  atorvastatin (LIPITOR) 10 MG tablet Take 2 tablets (20 mg total) by mouth daily at 6 PM. 12/17/19   Nolberto Hanlon, MD  cetirizine (ZYRTEC) 10 MG tablet Take 10 mg by  mouth daily.    [provider]  ferrous sulfate 325 (65 FE) MG tablet Take 325 mg by mouth 2 (two) times daily with a meal.     [provider]  furosemide (LASIX) 40 MG tablet Take 80 mg by mouth 2 (two) times a day.     [provider]  hydrALAZINE (APRESOLINE) 25 MG tablet Take 1 tablet (25 mg total) by mouth 3 (three) times daily. 12/17/19   Nolberto Hanlon, MD  insulin aspart protamine - aspart (NOVOLOG 70/30 MIX) (70-30) 100 UNIT/ML FlexPen Inject 84 Units into the skin 2 (two) times daily.     [provider]  lactulose (CHRONULAC) 10 GM/15ML solution Take 30 g by mouth 4 (four) times daily.     [provider]  LORazepam (ATIVAN) 0.5 MG tablet Take 1 tablet (0.5 mg total) by mouth every 8 (eight) hours as needed for anxiety. 10/18/18   [place] Lighter, MD  Multiple Vitamin (MULTIVITAMIN) tablet Take 1 tablet by mouth daily.    [provider]  pantoprazole (PROTONIX) 40 MG tablet Take 1 tablet (40 mg total) by mouth 2 (two) times daily. 03/06/19 03/05/20  Dustin Flock, MD  Potassium Chloride ER 20 MEQ TBCR Take 20 mEq by mouth 2 (two) times daily. While on lasix' Patient taking differently: Take 20 mEq by mouth 2 (two) times daily.  10/18/18   [place] Lighter, MD  traMADol (ULTRAM) 50 MG tablet Take 50 mg by mouth every 6 (six) hours as needed for moderate pain.     [provider]    Physical Exam: Vitals:   01/28/20 1558 01/28/20 1758 01/28/20 1955 01/28/20 1956  BP:  (!) 161/49    Pulse:  70 81 85  Resp:  (!) 24    Temp:      TempSrc:      SpO2:  99% 100% 100%  Weight: (!) 140.6 kg     Height: 5\' 9"  (1.753 m)      Constitutional: Obese man resting supine in bed, NAD, calm, comfortable Eyes: PERRL, lids and conjunctivae normal ENMT: Mucous membranes are moist. Posterior pharynx clear of any exudate or lesions. Neck: normal, supple, no masses. Respiratory: clear to auscultation bilaterally, no wheezing, no crackles.  Normal respiratory effort. No accessory muscle use.  Cardiovascular: Irregularly irregular, systolic murmur present.  Massive bilateral lower extremity edema and scrotal edema.. Abdomen: Obese abdomen, no tenderness, no masses palpated. Bowel sounds positive.  Musculoskeletal: no clubbing / cyanosis. No joint deformity upper and lower extremities. Good ROM, no contractures. Normal muscle tone.  Skin: Chronic venous stasis changes bilateral lower extremities Neurologic: CN 2-12 grossly intact. Sensation intact, Strength 5/5 in all 4.  Psychiatric: Normal judgment and insight. Alert and oriented x 3. Normal mood.   Labs on Admission: I have personally reviewed following labs and imaging studies  CBC: Recent Labs  Lab 01/28/20 1605  WBC 4.7  HGB 7.8*  HCT 25.6*  MCV 94.8  PLT 683*   Basic Metabolic Panel: Recent Labs  Lab 01/28/20 1605  NA 142  K 4.9  CL 113*  CO2 23  GLUCOSE 61*  BUN 42*  CREATININE 2.43*  CALCIUM 9.2   GFR: Estimated Creatinine Clearance: 41.2 mL/min (A) (by C-G formula based on SCr of 2.43 mg/dL (H)). Liver Function  Tests: Recent Labs  Lab 01/28/20 1605  AST 35  ALT 29  ALKPHOS 89  BILITOT 0.9  PROT 7.3  ALBUMIN 3.0*   No results for input(s): LIPASE, AMYLASE in the last 168 hours. Recent Labs  Lab 01/28/20 1948  AMMONIA 70*   Coagulation Profile: Recent Labs  Lab 01/28/20 1922  INR 1.2   Cardiac Enzymes: No results for input(s): CKTOTAL, CKMB, CKMBINDEX, TROPONINI in the last 168 hours. BNP (last 3 results) No results for input(s): PROBNP in the last 8760 hours. HbA1C: No results for input(s): HGBA1C in the last 72 hours. CBG: Recent Labs  Lab 01/28/20 1829 01/28/20 2049  GLUCAP 72 94   Lipid Profile: No results for input(s): CHOL, HDL, LDLCALC, TRIG, CHOLHDL, LDLDIRECT in the last 72 hours. Thyroid Function Tests: No results for input(s): TSH, T4TOTAL, FREET4, T3FREE, THYROIDAB in the last 72 hours. Anemia Panel: No  results for input(s): VITAMINB12, FOLATE, FERRITIN, TIBC, IRON, RETICCTPCT in the last 72 hours. Urine analysis:    Component Value Date/Time   COLORURINE STRAW (A) 01/28/2020 1607   APPEARANCEUR CLEAR (A) 01/28/2020 1607   APPEARANCEUR Clear 09/10/2014 0456   LABSPEC 1.006 01/28/2020 1607   LABSPEC 1.016 09/10/2014 0456   PHURINE 5.0 01/28/2020 1607   GLUCOSEU NEGATIVE 01/28/2020 1607   GLUCOSEU Negative 09/10/2014 0456   HGBUR NEGATIVE 01/28/2020 1607   BILIRUBINUR NEGATIVE 01/28/2020 1607   BILIRUBINUR Negative 09/10/2014 0456   KETONESUR NEGATIVE 01/28/2020 1607   PROTEINUR 30 (A) 01/28/2020 1607   NITRITE NEGATIVE 01/28/2020 1607   LEUKOCYTESUR NEGATIVE 01/28/2020 1607   LEUKOCYTESUR Negative 09/10/2014 0456    Radiological Exams on Admission: CT Head Wo Contrast  Result Date: 01/28/2020 CLINICAL DATA:  Hallucinations. Possible urinary tract infection. EXAM: CT HEAD WITHOUT CONTRAST TECHNIQUE: Contiguous axial images were obtained from the base of the skull through the vertex without intravenous contrast. COMPARISON:  04/28/2019 FINDINGS: Brain: No evidence of acute infarction, hemorrhage, hydrocephalus, extra-axial collection or mass lesion/mass effect. Vascular: No hyperdense vessel or unexpected calcification. Skull: Normal. Negative for fracture or focal lesion. Sinuses/Orbits: No acute finding. Other: None. IMPRESSION: Negative exam. Electronically Signed   By: Nolon Nations M.D.   On: 01/28/2020 19:16   DG Chest Port 1 View  Result Date: 01/28/2020 CLINICAL DATA:  Intermittent shortness of breath, diabetes EXAM: PORTABLE CHEST 1 VIEW COMPARISON:  12/14/2019 FINDINGS: The heart size and mediastinal contours are stable. Pulmonary vascular congestion with mild interstitial prominence, similar to the prior study. No large pleural fluid collection. Right costophrenic angle was not included on the film. No pneumothorax. The visualized skeletal structures are unremarkable.  IMPRESSION: Pulmonary vascular congestion with mild interstitial prominence, similar to the prior study. Findings may reflect mild edema. Electronically Signed   By: Davina Poke D.O.   On: 01/28/2020 20:20   US SCROTUM W/DOPPLER  Result Date: 01/28/2020 CLINICAL DATA:  Scrotal swelling EXAM: SCROTAL ULTRASOUND DOPPLER ULTRASOUND OF THE TESTICLES TECHNIQUE: Complete ultrasound examination of the testicles, epididymis, and other scrotal structures was performed. Color and spectral Doppler ultrasound were also utilized to evaluate blood flow to the testicles. COMPARISON:  09/27/2013 FINDINGS: Right testicle Measurements: 2.8 x 1.5 x 1.8 cm. The testicle is small similar to that seen on prior exam. 7 mm hypoechoic focus is noted centrally within testicle are decreased in size when compared with the prior exam. Left testicle Measurements: 4.0 x 2.4 x 2.1 cm. No mass or microlithiasis visualized. Right epididymis:  Normal in size and appearance. Left epididymis:  8 mm cyst is noted within the left epididymis. Hydrocele:  Large right hydrocele is noted. Varicocele:  None visualized. Pulsed Doppler interrogation of both testes demonstrates normal low resistance arterial and venous waveforms bilaterally. Diffuse scrotal edema is noted. IMPRESSION: The current scrotal swelling is related to diffuse scrotal edema. Chronic large right-sided hydrocele is again seen. Small hypoechoic focus within the right testicle which is decreased in size when compared with the prior exam from 2014 and likely of a benign etiology. Electronically Signed   By: Inez Catalina M.D.   On: 01/28/2020 20:50    EKG: Independently reviewed.  Sinus arrhythmia.  Assessment/Plan Principal Problem:   Volume overload Active Problems:   Cirrhosis (HCC)   Thrombocytopenia (HCC)   Chronic venous stasis dermatitis of both lower extremities   Hypertension associated with diabetes ([place])   Type 2 diabetes mellitus with diabetic nephropathy,  with long-term current use of insulin (HCC)   CKD (chronic kidney disease) stage 4, GFR 15-29 ml/min (HCC)   Hyperlipidemia associated with type 2 diabetes mellitus ([place])  Corey West is a 69 y.o. male with medical history significant for hepatic cirrhosis with esophageal varices and portal hypertension gastropathy, insulin-dependent type 2 diabetes, CKD stage IV, hypertension, hyperlipidemia, chronic venous stasis dermatitis and edema bilateral lower extremities, anxiety, and obesity who is admitted with volume overload.  Hepatic cirrhosis with anasarca and visual hallucinations: Patient with significant volume overload in setting of cirrhosis.  Ultrasound scrotum shows significant scrotal edema with chronic right hydrocele.  He reports recent visual hallucinations but is otherwise alert and oriented at time of admission.  Ammonia is 70. -Continue IV Lasix 60 mg twice daily -Monitor strict I/O's and daily weights -Continue lactulose, titrate to 2-3 bowel movements per day -Obtain ultrasound to assess for ascites volume -TTE on 12/16/2019 showed EF 60-65% with G1DD  CKD stage IV: Chronic and stable.  Continue monitor closely with diuresis.  Insulin-dependent type 2 diabetes: Hypoglycemic on ED arrival.  Will hold home NovoLog 70/30 and place on sensitive SSI for now.  Adjust as needed.  Anemia/thrombocytopenia: Chronic and stable without obvious bleeding in setting of cirrhosis and iron deficiency.  Continue to monitor.  Hypertension: Continue home hydralazine and IV Lasix as above.  Chronic venous stasis and edema of both lower extremities: Without obvious superimposed cellulitis.  On IV Lasix as above.  Hyperlipidemia: Continue atorvastatin.  Anxiety: Continue as needed Ativan.  DVT prophylaxis: Lovenox Code Status: Full code, confirmed with patient Family Communication: Discussed with patient, he has discussed with family Disposition Plan: From home, likely discharge to home  pending diuresis and symptomatic improvement Consults called: None Admission status:  Status is: Observation  The patient remains OBS appropriate and will d/c before 2 midnights.  Dispo: The patient is from: Home              Anticipated d/c is to: Home              Anticipated d/c date is: 1 day              Patient currently is not medically stable to d/c.   Zada Finders MD [facility]  If 7PM-7AM, please contact night-coverage [email or web address]  01/28/2020, 9:02 PM

## 2020-01-28 NOTE — ED Provider Notes (Signed)
[facility] Emergency Department Provider Note  ____________________________________________   First MD Initiated Contact with Patient 01/28/20 1825     (approximate)  I have reviewed the triage vital signs and the nursing notes.  History  Chief Complaint Hallucinations and Groin Swelling    HPI Corey West is a 69 y.o. male past medical history as below, notable for cirrhosis, CKD, venous stasis of the bilateral lower extremities who presents to the emergency department with concern for scrotal swelling, as well as hallucinations.    Patient reports over the last day he has developed a significant amount of swelling in his scrotum.  He reports significant fluid retention, both in his bilateral lower extremities up to the level of the groin, and now seemingly involving the scrotum.  He does report he takes oral diuretics, but they have recently had to space out some of his dosing in the setting of his CKD.  Reports associated pain in the scrotum secondary to swelling.  Describes the discomfort as a swelling, aching type discomfort, 10/10 in severity, does not radiate, no alleviating/aggravating components.  Second, he also presents complaining of hallucinations.  He states he is seeing people that are not there.  Reports a history of hallucinations previously in the setting of GI bleeds and/or when his ammonia level is elevated.  He does note that he did not take his lactulose dosing yesterday due to issues with the swelling.  Does report some increased shortness of breath over last several days to week, feels more short of breath while laying flat.  Denies any chest pain.  He also reports significant swelling to his bilateral lower extremities with serous weeping, being followed by the wound clinic for this.   Past Medical Hx Past Medical History:  Diagnosis Date  . Anemia   . Anxiety    controlled;   . Arthritis   . AVM (arteriovenous malformation) of  stomach, acquired with hemorrhage   . Barrett's esophagus   . Chronic kidney disease    renal infufficiency  . Cirrhosis ([place])   . Depression    controlled;   Marland Kitchen Diabetes mellitus without complication ([place])    not controlled, taking insulin but sugar continues to run high;   . Edema   . Esophageal varices ([place])   . GAVE (gastric antral vascular ectasia)   . GERD (gastroesophageal reflux disease)   . History of hiatal hernia   . Hyperlipidemia   . Hypertension    controlled well;   . Liver cirrhosis ([place])   . Nephropathy, diabetic ([place])   . Obesity   . Pancytopenia ([place])   . Polyp, stomach    with chronic blood loss  . Sleep apnea    does not wear a cpap, Medicare would not pay for it   . Venous stasis dermatitis of both lower extremities   . Venous stasis of both lower extremities    with cellulitis    Problem List Patient Active Problem List   Diagnosis Date Noted  . Liver cirrhosis ([place])   . Anxiety   . SOB (shortness of breath)   . Elevated troponin   . CKD (chronic kidney disease) stage 4, GFR 15-29 ml/min (HCC)   . Unstageable pressure ulcer of sacral region ([place]) 11/28/2019  . Chronic diastolic CHF (congestive heart failure) ([place]) 11/27/2019  . Type II diabetes mellitus with renal manifestations ([place]) 11/27/2019  . Cellulitis of left lower extremity 11/27/2019  . Cellulitis of lower extremity 11/27/2019  .  HCAP (healthcare-associated pneumonia) 02/03/2019  . Aphasia 01/14/2019  . Acute on chronic renal failure ([place]) 01/14/2019  . Malnutrition of moderate degree 12/03/2018  . C. difficile diarrhea 12/02/2018  . Ankle fracture, left, closed, initial encounter 10/15/2018  . Ankle fracture, right, closed, initial encounter 10/15/2018  . Ankle fracture 10/15/2018  . Pressure injury of skin 07/19/2018  . UTI (urinary tract infection) 07/18/2018  . Acute renal failure superimposed on stage 3a chronic kidney disease ([place]) 01/02/2018  . GAVE (gastric antral vascular  ectasia) 01/02/2018  . Goals of care, counseling/discussion   . Palliative care encounter   . Hepatic encephalopathy syndrome ([place]) 12/16/2017  . Hepatic encephalopathy ([place]) 10/18/2017  . Hypokalemia 10/09/2017  . Hyperglycemia 10/09/2017  . Symptomatic anemia 06/26/2017  . Sepsis ([place]) 06/12/2017  . Type 2 diabetes mellitus with diabetic nephropathy, with long-term current use of insulin ([place]) 05/29/2017  . Cellulitis in diabetic foot ([place]) 02/21/2017  . Cellulitis 02/21/2017  . GI bleed 07/20/2015  . Hematemesis 02/08/2015  . Cirrhosis ([place]) 02/08/2015  . Esophageal varices ([place]) 02/08/2015  . Gastroesophageal reflux disease 02/08/2015  . Hyperlipidemia 02/08/2015  . Chronic venous stasis dermatitis of both lower extremities 10/06/2014  . Edema 05/25/2014  . HTN (hypertension) 05/25/2014  . Obesity 05/25/2014  . Sleep apnea 05/25/2014  . Barrett's esophagus 05/25/2014  . Pancytopenia ([place]) 05/25/2014  . Iron deficiency anemia 12/04/2013  . Thrombocytopenia ([place]) 12/04/2013  . Splenomegaly 11/04/2013    Past Surgical Hx Past Surgical History:  Procedure Laterality Date  . ESOPHAGOGASTRODUODENOSCOPY N/A 02/09/2015   Procedure: ESOPHAGOGASTRODUODENOSCOPY (EGD);  Surgeon: Manya Silvas, MD;  [facility] END[facility]COPY;  Service: Endoscopy;  Laterality: N/A;  . ESOPHAGOGASTRODUODENOSCOPY N/A 07/22/2015   Procedure: ESOPHAGOGASTRODUODENOSCOPY (EGD);  Surgeon: Manya Silvas, MD;  Location: [facility] ENDOSCOPY;  Service: Endoscopy;  Laterality: N/A;  . ESOPHAGOGASTRODUODENOSCOPY N/A 06/26/2017   Procedure: ESOPHAGOGASTRODUODENOSCOPY (EGD);  Surgeon: Manya Silvas, MD;  Location: [facility] ENDOSCOPY;  Service: Endoscopy;  Laterality: N/A;  . ESOPHAGOGASTRODUODENOSCOPY N/A 06/27/2017   Procedure: ESOPHAGOGASTRODUODENOSCOPY (EGD);  Surgeon: Manya Silvas, MD;  Location: [facility] ENDOSCOPY;  Service: Endoscopy;  Laterality: N/A;  . ESOPHAGOGASTRODUODENOSCOPY N/A 06/28/2017   Procedure:  ESOPHAGOGASTRODUODENOSCOPY (EGD);  Surgeon: Manya Silvas, MD;  Location: [facility] ENDOSCOPY;  Service: Endoscopy;  Laterality: N/A;  . ESOPHAGOGASTRODUODENOSCOPY Bilateral 10/11/2017   Procedure: ESOPHAGOGASTRODUODENOSCOPY (EGD);  Surgeon: Manya Silvas, MD;  Location: [facility] ENDOSCOPY;  Service: Endoscopy;  Laterality: Bilateral;  . ESOPHAGOGASTRODUODENOSCOPY N/A 12/04/2017   Procedure: ESOPHAGOGASTRODUODENOSCOPY (EGD);  Surgeon: Manya Silvas, MD;  Location[facility] ENDOSCOPY;  Service: Endoscopy;  Laterality: N/A;  . ESOPHAGOGASTRODUODENOSCOPY N/A 03/06/2019   Procedure: ESOPHAGOGASTRODUODENOSCOPY (EGD);  Surgeon: Tol[place], Benay Pike, MD;  Location: [facility] ENDOSCOPY;  Service: Gastroenterology;  Laterality: N/A;  . ESOPHAGOGASTRODUODENOSCOPY (EGD) WITH PROPOFOL N/A 07/20/2015   Procedure: ESOPHAGOGASTRODUODENOSCOPY (EGD) WITH PROPOFOL;  Surgeon: Manya Silvas, MD;  Location: [facility] ENDOSCOPY;  Service: Endoscopy;  Laterality: N/A;  . ESOPHAGOGASTRODUODENOSCOPY (EGD) WITH PROPOFOL N/A 09/16/2015   Procedure: ESOPHAGOGASTRODUODENOSCOPY (EGD) WITH PROPOFOL;  Surgeon: Manya Silvas, MD;  Location: [facility] ENDOSCOPY;  Service: Endoscopy;  Laterality: N/A;  . ESOPHAGOGASTRODUODENOSCOPY (EGD) WITH PROPOFOL N/A 03/16/2016   Procedure: ESOPHAGOGASTRODUODENOSCOPY (EGD) WITH PROPOFOL;  Surgeon: Manya Silvas, MD;  Location: [facility] ENDOSCOPY;  Service: Endoscopy;  Laterality: N/A;  . ESOPHAGOGASTRODUODENOSCOPY (EGD) WITH PROPOFOL N/A 09/14/2016   Procedure: ESOPHAGOGASTRODUODENOSCOPY (EGD) WITH PROPOFOL;  Surgeon: Manya Silvas, MD;  Location: [facility] ENDOSCOPY;  Service: Endoscopy;  Laterality: N/A;  . ESOPHAGOGASTRODUODENOSCOPY (EGD) WITH PROPOFOL N/A  11/05/2016   Procedure: ESOPHAGOGASTRODUODENOSCOPY (EGD) WITH PROPOFOL;  Surgeon: Manya Silvas, MD;  Location: [facility] ENDOSCOPY;  Service: Endoscopy;  Laterality: N/A;  . ESOPHAGOGASTRODUODENOSCOPY (EGD) WITH PROPOFOL N/A 02/06/2017   Procedure:  ESOPHAGOGASTRODUODENOSCOPY (EGD) WITH PROPOFOL;  Surgeon: Manya Silvas, MD;  Location: [facility] ENDOSCOPY;  Service: Endoscopy;  Laterality: N/A;  . ESOPHAGOGASTRODUODENOSCOPY (EGD) WITH PROPOFOL N/A 04/24/2017   Procedure: ESOPHAGOGASTRODUODENOSCOPY (EGD) WITH PROPOFOL;  Surgeon: Manya Silvas, MD;  Location: [facility] ENDOSCOPY;  Service: Endoscopy;  Laterality: N/A;  . ESOPHAGOGASTRODUODENOSCOPY (EGD) WITH PROPOFOL N/A 07/31/2017   Procedure: ESOPHAGOGASTRODUODENOSCOPY (EGD) WITH PROPOFOL;  Surgeon: Manya Silvas, MD;  Location: [facility] ENDOSCOPY;  Service: Endoscopy;  Laterality: N/A;  . ESOPHAGOGASTRODUODENOSCOPY (EGD) WITH PROPOFOL N/A 08/08/2018   Procedure: ESOPHAGOGASTRODUODENOSCOPY (EGD) WITH PROPOFOL;  Surgeon: Manya Silvas, MD;  Location: [facility] ENDOSCOPY;  Service: Endoscopy;  Laterality: N/A;  . GIVENS CAPSULE STUDY N/A 12/05/2017   Procedure: GIVENS CAPSULE STUDY;  Surgeon: Manya Silvas, MD;  Location: [facility] ENDOSCOPY;  Service: Endoscopy;  Laterality: N/A;  . TONSILLECTOMY    . TONSILLECTOMY AND ADENOIDECTOMY    . ULNAR NERVE TRANSPOSITION    . UVULOPALATOPHARYNGOPLASTY      Medications Prior to Admission medications   Medication Sig Start Date End Date Taking? Authorizing Provider  atorvastatin (LIPITOR) 10 MG tablet Take 2 tablets (20 mg total) by mouth daily at 6 PM. 12/17/19   Nolberto Hanlon, MD  cetirizine (ZYRTEC) 10 MG tablet Take 10 mg by mouth daily.    [provider]  ferrous sulfate 325 (65 FE) MG tablet Take 325 mg by mouth 2 (two) times daily with a meal.     [provider]  furosemide (LASIX) 40 MG tablet Take 80 mg by mouth 2 (two) times a day.     [provider]  hydrALAZINE (APRESOLINE) 25 MG tablet Take 1 tablet (25 mg total) by mouth 3 (three) times daily. 12/17/19   Nolberto Hanlon, MD  insulin aspart protamine - aspart (NOVOLOG 70/30 MIX) (70-30) 100 UNIT/ML FlexPen Inject 84 Units into the skin 2 (two) times daily.     [provider]  lactulose (CHRONULAC) 10 GM/15ML solution Take 30 g by mouth 4 (four) times daily.     [provider]  LORazepam (ATIVAN) 0.5 MG tablet Take 1 tablet (0.5 mg total) by mouth every 8 (eight) hours as needed for anxiety. 10/18/18   Gladstone Lighter, MD  Multiple Vitamin (MULTIVITAMIN) tablet Take 1 tablet by mouth daily.    [provider]  pantoprazole (PROTONIX) 40 MG tablet Take 1 tablet (40 mg total) by mouth 2 (two) times daily. 03/06/19 03/05/20  Dustin Flock, MD  Potassium Chloride ER 20 MEQ TBCR Take 20 mEq by mouth 2 (two) times daily. While on lasix' Patient taking differently: Take 20 mEq by mouth 2 (two) times daily.  10/18/18   Gladstone Lighter, MD  traMADol (ULTRAM) 50 MG tablet Take 50 mg by mouth every 6 (six) hours as needed for moderate pain.     [provider]    Allergies Patient has no known allergies.  Family Hx Family History  Problem Relation Age of Onset  . Diabetes Other   . Transient ischemic attack Father   . CAD Father     Social Hx Social History   Tobacco Use  . Smoking status: Former Smoker    Years: 20.00    Types: Cigars, Cigarettes  . Smokeless tobacco: Never Used  Substance Use Topics  .  Alcohol use: No    Comment: stopped 15 years ago  . Drug use: No     Review of Systems  Constitutional: Negative for fever. Negative for chills. Eyes: Negative for visual changes. ENT: Negative for sore throat. Cardiovascular: Negative for chest pain. Respiratory: Positive for shortness of breath. Gastrointestinal: Negative for nausea. Negative for vomiting.  Genitourinary: Positive for scrotal swelling. Musculoskeletal: Positive for leg swelling. Skin: Negative for rash. Neurological: Negative for headaches.  Positive for hallucinations.   Physical Exam  Vital Signs: ED Triage Vitals  Enc Vitals Group     BP 01/28/20 1557 (!) 145/54     Pulse Rate 01/28/20 1557 71     Resp 01/28/20 1557 20      Temp 01/28/20 1557 97.8 F (36.6 C)     Temp Source 01/28/20 1557 Oral     SpO2 01/28/20 1557 99 %     Weight 01/28/20 1558 (!) 310 lb (140.6 kg)     Height 01/28/20 1558 5\' 9"  (1.753 m)     Head Circumference --      Peak Flow --      Pain Score 01/28/20 1558 10     Pain Loc --      Pain Edu? --      Excl. in [place]? --     Constitutional: Alert and oriented.  Obese.  NAD.  Head: Normocephalic. Atraumatic. Eyes: Conjunctivae clear. Sclera anicteric. Pupils equal and symmetric. Nose: No masses or lesions. No congestion or rhinorrhea. Mouth/Throat: Wearing mask.  Neck: No stridor. Trachea midline.  Cardiovascular: Normal rate, regular rhythm. Extremities well perfused. Respiratory: Appears significantly short of breath with laying flat.  Improved with sitting up. Gastrointestinal: Soft. Non-distended. Non-tender.  Genitourinary: RN chaperone present.  Significant scrotal edema with generalized discomfort on examination.  No significant erythema or warmth of the scrotum.  No crepitance.  No concern for Fournier's.  No discharge. Musculoskeletal: Significant pitting edema to the bilateral lower extremities up to the level of the groin.  Lower BLE wrapped and dressed, as patient follows with wound care.  Findings consistent with venous stasis. Neurologic:  Normal speech and language. No gross focal or lateralizing neurologic deficits are appreciated.  Skin: Weeping of the bilateral BLE in the setting of significant edema. Psychiatric: Mood and affect are appropriate for situation.  EKG  Personally reviewed and interpreted by myself.   Date: 01/28/20 Time: 1610 Rate: 94 Rhythm: junctional vs prolonged PR Axis: left Intervals: question prolonged PR No acute ischemic changes No STEMI    Radiology  Personally reviewed available imaging myself.   CT head - IMPRESSION:  Negative exam.   CXR - IMPRESSION:  Pulmonary vascular congestion with mild interstitial prominence,  similar  to the prior study. Findings may reflect mild edema.   US scrotum - IMPRESSION:  The current scrotal swelling is related to diffuse scrotal edema.  Chronic large right-sided hydrocele is again seen.  Small hypoechoic focus within the right testicle which is decreased in size when compared with the prior exam from 2014 and likely of a benign etiology.    Procedures  Procedure(s) performed (including critical care):  Procedures   Initial Impression / Assessment and Plan / MDM / ED Course  69 y.o. male who presents to the ED for #1 scrotal swelling, #2 hallucinations  Ddx: increased edema/swelling 2/2 cirrhosis, CKD, undiagnosed HF Ddx: elevated ammonia, UTI, intracranial abnormality  Will plan for labs, imaging, [place]  Clinical Course as of Jan 27 2099  Thu  Jan 28, 2020  2030 Ammonia elevated at 70, this appears increased from his baseline/prior as well. Urine negative for infection. Edema on CXR, suspect 2/2 to his liver and kidney status.  We will give a dose of IV Lasix here.   [SM]  2059 Ultrasound reveals scrotal swelling consistent with scrotal edema.  Normal flow.  CT head negative.  We will plan to admit for further treatment of his ammonia and fluid levels.  Patient is agreeable.  Discussed with hospitalist for admission.   [SM]    Clinical Course User Index [SM] Lilia Pro., MD     _______________________________   As part of my medical decision making I have reviewed available labs, radiology tests, reviewed old records/performed chart review.    Final Clinical Impression(s) / ED Diagnosis  Final diagnoses:  Scrotal swelling  Increased ammonia level  Orthopnea       Note:  This document was prepared using Dragon voice recognition software and may include unintentional dictation errors.   Lilia Pro., MD 01/28/20 2100

## 2020-01-28 NOTE — ED Notes (Addendum)
Pt reports hallucinations for 3 weeks. A/Ox4 at this time.  Reports leg swelling and scrotum swelling and pain with movement since yesterday.   Pt st taking diuretics as prescribed and seen his PCP for his leg swelling which has gotten better over the last few days.   C/o intermittent SHOB when laying down. Pt diabetic and "my blood sugars has been dropping a lot lately". CBG 72. Given OJ/PB.   Pt reports wound care nurse visit pt at home twice a week.

## 2020-01-28 NOTE — Progress Notes (Signed)
PHARMACIST - PHYSICIAN COMMUNICATION  CONCERNING:  Enoxaparin (Lovenox) for DVT Prophylaxis    RECOMMENDATION: Patient was prescribed enoxaprin 40mg  q24 hours for VTE prophylaxis.   Filed Weights   01/28/20 1558  Weight: (!) 140.6 kg (310 lb)    Body mass index is 45.78 kg/m.  Estimated Creatinine Clearance: 41.2 mL/min (A) (by C-G formula based on SCr of 2.43 mg/dL (H)).   Based on [place] patient is candidate for enoxaparin 40mg  every 12 hour dosing due to BMI being >40.   DESCRIPTION: Pharmacy has adjusted enoxaparin dose per [facility] policy.  Patient is now receiving enoxaparin 40mg  every 12 hours.    Ena Dawley, PharmD Clinical Pharmacist  01/28/2020 11:31 PM

## 2020-01-28 NOTE — ED Triage Notes (Signed)
Patient to ER via [facility] from home for c/o possible UTI. Patient lives alone, reports hallucinations. Has h/o CKD and frequent falls. Vitals per EMS were WNL. Patient also reports scrotal swelling.

## 2020-01-29 ENCOUNTER — Observation Stay: Payer: Medicare Other

## 2020-01-29 DIAGNOSIS — Z20822 Contact with and (suspected) exposure to covid-19: Secondary | ICD-10-CM | POA: Diagnosis present

## 2020-01-29 DIAGNOSIS — E8779 Other fluid overload: Secondary | ICD-10-CM | POA: Diagnosis not present

## 2020-01-29 DIAGNOSIS — I482 Chronic atrial fibrillation, unspecified: Secondary | ICD-10-CM | POA: Diagnosis present

## 2020-01-29 DIAGNOSIS — I10 Essential (primary) hypertension: Secondary | ICD-10-CM

## 2020-01-29 DIAGNOSIS — I5033 Acute on chronic diastolic (congestive) heart failure: Secondary | ICD-10-CM | POA: Diagnosis present

## 2020-01-29 DIAGNOSIS — I872 Venous insufficiency (chronic) (peripheral): Secondary | ICD-10-CM | POA: Diagnosis present

## 2020-01-29 DIAGNOSIS — R441 Visual hallucinations: Secondary | ICD-10-CM | POA: Diagnosis present

## 2020-01-29 DIAGNOSIS — D500 Iron deficiency anemia secondary to blood loss (chronic): Secondary | ICD-10-CM | POA: Diagnosis not present

## 2020-01-29 DIAGNOSIS — K766 Portal hypertension: Secondary | ICD-10-CM | POA: Diagnosis present

## 2020-01-29 DIAGNOSIS — E1159 Type 2 diabetes mellitus with other circulatory complications: Secondary | ICD-10-CM

## 2020-01-29 DIAGNOSIS — E8770 Fluid overload, unspecified: Secondary | ICD-10-CM | POA: Diagnosis present

## 2020-01-29 DIAGNOSIS — F419 Anxiety disorder, unspecified: Secondary | ICD-10-CM | POA: Diagnosis present

## 2020-01-29 DIAGNOSIS — Y909 Presence of alcohol in blood, level not specified: Secondary | ICD-10-CM | POA: Diagnosis present

## 2020-01-29 DIAGNOSIS — E1140 Type 2 diabetes mellitus with diabetic neuropathy, unspecified: Secondary | ICD-10-CM | POA: Diagnosis present

## 2020-01-29 DIAGNOSIS — K219 Gastro-esophageal reflux disease without esophagitis: Secondary | ICD-10-CM | POA: Diagnosis present

## 2020-01-29 DIAGNOSIS — Z6841 Body Mass Index (BMI) 40.0 and over, adult: Secondary | ICD-10-CM | POA: Diagnosis not present

## 2020-01-29 DIAGNOSIS — D62 Acute posthemorrhagic anemia: Secondary | ICD-10-CM | POA: Diagnosis not present

## 2020-01-29 DIAGNOSIS — E11649 Type 2 diabetes mellitus with hypoglycemia without coma: Secondary | ICD-10-CM | POA: Diagnosis present

## 2020-01-29 DIAGNOSIS — I152 Hypertension secondary to endocrine disorders: Secondary | ICD-10-CM | POA: Diagnosis present

## 2020-01-29 DIAGNOSIS — E785 Hyperlipidemia, unspecified: Secondary | ICD-10-CM | POA: Diagnosis present

## 2020-01-29 DIAGNOSIS — N179 Acute kidney failure, unspecified: Secondary | ICD-10-CM | POA: Diagnosis present

## 2020-01-29 DIAGNOSIS — I130 Hypertensive heart and chronic kidney disease with heart failure and stage 1 through stage 4 chronic kidney disease, or unspecified chronic kidney disease: Secondary | ICD-10-CM | POA: Diagnosis present

## 2020-01-29 DIAGNOSIS — K7200 Acute and subacute hepatic failure without coma: Secondary | ICD-10-CM | POA: Diagnosis present

## 2020-01-29 DIAGNOSIS — E1122 Type 2 diabetes mellitus with diabetic chronic kidney disease: Secondary | ICD-10-CM | POA: Diagnosis present

## 2020-01-29 DIAGNOSIS — N184 Chronic kidney disease, stage 4 (severe): Secondary | ICD-10-CM

## 2020-01-29 DIAGNOSIS — K7460 Unspecified cirrhosis of liver: Secondary | ICD-10-CM | POA: Diagnosis not present

## 2020-01-29 DIAGNOSIS — J9621 Acute and chronic respiratory failure with hypoxia: Secondary | ICD-10-CM | POA: Diagnosis present

## 2020-01-29 DIAGNOSIS — J9601 Acute respiratory failure with hypoxia: Secondary | ICD-10-CM | POA: Diagnosis not present

## 2020-01-29 DIAGNOSIS — K7031 Alcoholic cirrhosis of liver with ascites: Secondary | ICD-10-CM | POA: Diagnosis present

## 2020-01-29 DIAGNOSIS — D649 Anemia, unspecified: Secondary | ICD-10-CM | POA: Diagnosis not present

## 2020-01-29 DIAGNOSIS — E1169 Type 2 diabetes mellitus with other specified complication: Secondary | ICD-10-CM | POA: Diagnosis present

## 2020-01-29 DIAGNOSIS — I8510 Secondary esophageal varices without bleeding: Secondary | ICD-10-CM | POA: Diagnosis present

## 2020-01-29 LAB — PROTIME-INR
INR: 1.3 — ABNORMAL HIGH (ref 0.8–1.2)
Prothrombin Time: 15.6 seconds — ABNORMAL HIGH (ref 11.4–15.2)

## 2020-01-29 LAB — CBC
HCT: 24.1 % — ABNORMAL LOW (ref 39.0–52.0)
Hemoglobin: 7.6 g/dL — ABNORMAL LOW (ref 13.0–17.0)
MCH: 29.2 pg (ref 26.0–34.0)
MCHC: 31.5 g/dL (ref 30.0–36.0)
MCV: 92.7 fL (ref 80.0–100.0)
Platelets: 114 10*3/uL — ABNORMAL LOW (ref 150–400)
RBC: 2.6 MIL/uL — ABNORMAL LOW (ref 4.22–5.81)
RDW: 15.2 % (ref 11.5–15.5)
WBC: 4.3 10*3/uL (ref 4.0–10.5)
nRBC: 0 % (ref 0.0–0.2)

## 2020-01-29 LAB — COMPREHENSIVE METABOLIC PANEL
ALT: 24 U/L (ref 0–44)
AST: 31 U/L (ref 15–41)
Albumin: 2.7 g/dL — ABNORMAL LOW (ref 3.5–5.0)
Alkaline Phosphatase: 82 U/L (ref 38–126)
Anion gap: 9 (ref 5–15)
BUN: 41 mg/dL — ABNORMAL HIGH (ref 8–23)
CO2: 23 mmol/L (ref 22–32)
Calcium: 9.1 mg/dL (ref 8.9–10.3)
Chloride: 111 mmol/L (ref 98–111)
Creatinine, Ser: 2.46 mg/dL — ABNORMAL HIGH (ref 0.61–1.24)
GFR calc Af Amer: 30 mL/min — ABNORMAL LOW (ref >60)
GFR calc non Af Amer: 26 mL/min — ABNORMAL LOW (ref >60)
Glucose, Bld: 124 mg/dL — ABNORMAL HIGH (ref 70–99)
Potassium: 4.3 mmol/L (ref 3.5–5.1)
Sodium: 143 mmol/L (ref 135–145)
Total Bilirubin: 0.8 mg/dL (ref 0.3–1.2)
Total Protein: 6.5 g/dL (ref 6.5–8.1)

## 2020-01-29 LAB — GLUCOSE, CAPILLARY
Glucose-Capillary: 102 mg/dL — ABNORMAL HIGH (ref 70–99)
Glucose-Capillary: 140 mg/dL — ABNORMAL HIGH (ref 70–99)
Glucose-Capillary: 175 mg/dL — ABNORMAL HIGH (ref 70–99)
Glucose-Capillary: 196 mg/dL — ABNORMAL HIGH (ref 70–99)

## 2020-01-29 LAB — BRAIN NATRIURETIC PEPTIDE: B Natriuretic Peptide: 246 pg/mL — ABNORMAL HIGH (ref 0.0–100.0)

## 2020-01-29 MED ORDER — SPIRONOLACTONE 25 MG PO TABS
25.0000 mg | ORAL_TABLET | Freq: Every day | ORAL | Status: DC
  Administered 2020-01-29 – 2020-02-12 (×15): 25 mg via ORAL
  Filled 2020-01-29 (×15): qty 1

## 2020-01-29 MED ORDER — ALBUMIN HUMAN 25 % IV SOLN
25.0000 g | Freq: Three times a day (TID) | INTRAVENOUS | Status: AC
  Administered 2020-01-29 – 2020-02-01 (×9): 25 g via INTRAVENOUS
  Filled 2020-01-29 (×9): qty 100

## 2020-01-29 MED ORDER — OXYCODONE HCL 5 MG PO TABS
5.0000 mg | ORAL_TABLET | Freq: Once | ORAL | Status: AC
  Administered 2020-01-29: 5 mg via ORAL
  Filled 2020-01-29: qty 1

## 2020-01-29 MED ORDER — SALINE SPRAY 0.65 % NA SOLN
1.0000 | NASAL | Status: DC | PRN
  Administered 2020-01-29 – 2020-02-06 (×4): 1 via NASAL
  Filled 2020-01-29 (×3): qty 44

## 2020-01-29 MED ORDER — FUROSEMIDE 10 MG/ML IJ SOLN
8.0000 mg/h | INTRAVENOUS | Status: AC
  Administered 2020-01-29 – 2020-02-01 (×3): 8 mg/h via INTRAVENOUS
  Filled 2020-01-29: qty 25
  Filled 2020-01-29: qty 20
  Filled 2020-01-29: qty 25

## 2020-01-29 NOTE — Progress Notes (Signed)
PROGRESS NOTE    Corey West  OBS:[id number] DOB: 1951/07/30 DOA: 01/28/2020 PCP: System, Pcp Not In    Brief Narrative:  Corey West is a 69 y.o. 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  Patient reports several days of progressive increase edema in bilateral lower extremities and scrotum.  He has been having associated shortness of breath.  He says he had visual hallucinations today but denies any confusion.  He has been taking lactulose with last bowel movement yesterday however states he did not take his lactulose today due to his significant scrotal discomfort.  He also notes that previously loosefitting pants are now tight on him.  He reports good urine output.  He denies any subjective fevers, chills, diaphoresis, chest pain.    Consultants:     Procedures:   Antimicrobials:      Subjective: Pt has no sob. Feels swollen. No cp  Objective: Vitals:   01/28/20 2310 01/29/20 0009 01/29/20 0443 01/29/20 1134  BP: (!) 174/69 (!) 153/46 (!) 130/51 (!) 143/57  Pulse: 81 80 82 71  Resp: 20 18 18 16   Temp: 97.6 F (36.4 C) 97.8 F (36.6 C) 97.6 F (36.4 C) 97.9 F (36.6 C)  TempSrc: Oral  Oral Oral  SpO2: 100% 99% 100% 99%  Weight:   (!) 145.3 kg   Height:        Intake/Output Summary (Last 24 hours) at 01/29/2020 1448 Last data filed at 01/29/2020 0501 Gross per 24 hour  Intake --  Output 1800 ml  Net -1800 ml   Filed Weights   01/28/20 1558 01/29/20 0443  Weight: (!) 140.6 kg (!) 145.3 kg    Examination:  General exam: Appears calm and comfortable  Respiratory system: scattered rales at bases  Cardiovascular system: S1 & S2 heard, RRR. No  JVD, murmurs, rubs, gallops or clicks. No . Gastrointestinal system: Abdomen is nondistended, soft and nontender.. Normal bowel sounds heard. +fluid GU: scrotal edema.  Central nervous system: Alert and oriented.  Extremities: +edema b/l  Skin: Warm dry Psychiatry:Mood & affect appropriate in current setting.     Data Reviewed: I have personally reviewed following labs and imaging studies  CBC: Recent Labs  Lab 01/28/20 1605 01/29/20 0539  WBC 4.7 4.3  HGB 7.8* 7.6*  HCT 25.6* 24.1*  MCV 94.8 92.7  PLT 122* 476*   Basic Metabolic Panel: Recent Labs  Lab 01/28/20 1605 01/29/20 0539  NA 142 143  K 4.9 4.3  CL 113* 111  CO2 23 23  GLUCOSE 61* 124*  BUN 42* 41*  CREATININE 2.43* 2.46*  CALCIUM 9.2 9.1   GFR: Estimated Creatinine Clearance: 41.4 mL/min (A) (by C-G formula based on SCr of 2.46 mg/dL (H)). Liver Function Tests: Recent Labs  Lab 01/28/20 1605 01/29/20 0539  AST 35 31  ALT 29 24  ALKPHOS 89 82  BILITOT 0.9 0.8  PROT 7.3 6.5  ALBUMIN 3.0* 2.7*   No results for input(s): LIPASE, AMYLASE in the last 168 hours. Recent Labs  Lab 01/28/20 1948  AMMONIA 70*   Coagulation Profile: Recent Labs  Lab 01/28/20 1922 01/29/20 0539  INR 1.2 1.3*   Cardiac Enzymes: No results for input(s): CKTOTAL, CKMB, CKMBINDEX, TROPONINI in the last 168 hours. BNP (last  3 results) No results for input(s): PROBNP in the last 8760 hours. HbA1C: No results for input(s): HGBA1C in the last 72 hours. CBG: Recent Labs  Lab 01/28/20 1829 01/28/20 2049 01/28/20 2319 01/29/20 0741 01/29/20 1217  GLUCAP 72 94 82 102* 140*   Lipid Profile: No results for input(s): CHOL, HDL, LDLCALC, TRIG, CHOLHDL, LDLDIRECT in the last 72 hours. Thyroid Function Tests: No results for input(s): TSH, T4TOTAL, FREET4, T3FREE, THYROIDAB in the last 72 hours. Anemia Panel: No results for input(s): VITAMINB12, FOLATE, FERRITIN, TIBC, IRON, RETICCTPCT in the last 72 hours. Sepsis  Labs: No results for input(s): PROCALCITON, LATICACIDVEN in the last 168 hours.  Recent Results (from the past 240 hour(s))  Respiratory Panel by RT PCR (Flu A&B, Covid) - Nasopharyngeal Swab     Status: None   Collection Time: 01/28/20 10:34 PM   Specimen: Nasopharyngeal Swab  Result Value Ref Range Status   SARS Coronavirus 2 by RT PCR NEGATIVE NEGATIVE Final    Comment: (NOTE) SARS-CoV-2 target nucleic acids are NOT DETECTED. The SARS-CoV-2 RNA is generally detectable in upper respiratoy specimens during the acute phase of infection. The lowest concentration of SARS-CoV-2 viral copies this assay can detect is 131 copies/mL. A negative result does not preclude SARS-Cov-2 infection and should not be used as the sole basis for treatment or other patient management decisions. A negative result may occur with  improper specimen collection/handling, submission of specimen other than nasopharyngeal swab, presence of viral mutation(s) within the areas targeted by this assay, and inadequate number of viral copies (<131 copies/mL). A negative result must be combined with clinical observations, patient history, and epidemiological information. The expected result is Negative. Fact Sheet for Patients:  PinkCheek.be Fact Sheet for Healthcare Providers:  GravelBags.it This test is not yet ap proved or cleared by the [place] FDA and  has been authorized for detection and/or diagnosis of SARS-CoV-2 by FDA under an Emergency Use Authorization (EUA). This EUA will remain  in effect (meaning this test can be used) for the duration of the COVID-19 declaration under Section 564(b)(1) of the Act, 21 U.S.C. section 360bbb-3(b)(1), unless the authorization is terminated or revoked sooner.    Influenza A by PCR NEGATIVE NEGATIVE Final   Influenza B by PCR NEGATIVE NEGATIVE Final    Comment: (NOTE) The Xpert Xpress SARS-CoV-2/FLU/RSV assay  is intended as an aid in  the diagnosis of influenza from Nasopharyngeal swab specimens and  should not be used as a sole basis for treatment. Nasal washings and  aspirates are unacceptable for Xpert Xpress SARS-CoV-2/FLU/RSV  testing. Fact Sheet for Patients: PinkCheek.be Fact Sheet for Healthcare Providers: GravelBags.it This test is not yet approved or cleared by the [place] FDA and  has been authorized for detection and/or diagnosis of SARS-CoV-2 by  FDA under an Emergency Use Authorization (EUA). This EUA will remain  in effect (meaning this test can be used) for the duration of the  Covid-19 declaration under Section 564(b)(1) of the Act, 21  U.S.C. section 360bbb-3(b)(1), unless the authorization is  terminated or revoked. Performed at [facility], [address], [place], [place] [address]          Radiology Studies: CT Head Wo Contrast  Result Date: 01/28/2020 CLINICAL DATA:  Hallucinations. Possible urinary tract infection. EXAM: CT HEAD WITHOUT CONTRAST TECHNIQUE: Contiguous axial images were obtained from the base of the skull through the vertex without intravenous contrast. COMPARISON:  04/28/2019 FINDINGS: Brain: No evidence of acute infarction, hemorrhage, hydrocephalus,  extra-axial collection or mass lesion/mass effect. Vascular: No hyperdense vessel or unexpected calcification. Skull: Normal. Negative for fracture or focal lesion. Sinuses/Orbits: No acute finding. Other: None. IMPRESSION: Negative exam. Electronically Signed   By: Nolon Nations M.D.   On: 01/28/2020 19:16   DG Chest Port 1 View  Result Date: 01/28/2020 CLINICAL DATA:  Intermittent shortness of breath, diabetes EXAM: PORTABLE CHEST 1 VIEW COMPARISON:  12/14/2019 FINDINGS: The heart size and mediastinal contours are stable. Pulmonary vascular congestion with mild interstitial prominence, similar to the prior study. No large  pleural fluid collection. Right costophrenic angle was not included on the film. No pneumothorax. The visualized skeletal structures are unremarkable. IMPRESSION: Pulmonary vascular congestion with mild interstitial prominence, similar to the prior study. Findings may reflect mild edema. Electronically Signed   By: Davina Poke D.O.   On: 01/28/2020 20:20   [place] ASCITES (ABDOMEN LIMITED)  Result Date: 01/29/2020 CLINICAL DATA:  Cirrhosis. EXAM: ULTRASOUND ABDOMEN LIMITED COMPARISON:  None. FINDINGS: All 4 quadrants of the abdomen were cysts by ultrasound. There is trace ascites within the RIGHT lower quadrant and LEFT lower quadrant. No additional ascites demonstrated. IMPRESSION: Trace ascites. Electronically Signed   By: Franki Cabot M.D.   On: 01/29/2020 08:23   US SCROTUM W/DOPPLER  Result Date: 01/28/2020 CLINICAL DATA:  Scrotal swelling EXAM: SCROTAL ULTRASOUND DOPPLER ULTRASOUND OF THE TESTICLES TECHNIQUE: Complete ultrasound examination of the testicles, epididymis, and other scrotal structures was performed. Color and spectral Doppler ultrasound were also utilized to evaluate blood flow to the testicles. COMPARISON:  09/27/2013 FINDINGS: Right testicle Measurements: 2.8 x 1.5 x 1.8 cm. The testicle is small similar to that seen on prior exam. 7 mm hypoechoic focus is noted centrally within testicle are decreased in size when compared with the prior exam. Left testicle Measurements: 4.0 x 2.4 x 2.1 cm. No mass or microlithiasis visualized. Right epididymis:  Normal in size and appearance. Left epididymis:  8 mm cyst is noted within the left epididymis. Hydrocele:  Large right hydrocele is noted. Varicocele:  None visualized. Pulsed Doppler interrogation of both testes demonstrates normal low resistance arterial and venous waveforms bilaterally. Diffuse scrotal edema is noted. IMPRESSION: The current scrotal swelling is related to diffuse scrotal edema. Chronic large right-sided hydrocele is again  seen. Small hypoechoic focus within the right testicle which is decreased in size when compared with the prior exam from 2014 and likely of a benign etiology. Electronically Signed   By: Inez Catalina M.D.   On: 01/28/2020 20:50        Scheduled Meds: . atorvastatin  20 mg Oral q1800  . enoxaparin (LOVENOX) injection  40 mg Subcutaneous Q12H  . hydrALAZINE  25 mg Oral TID  . insulin aspart  0-9 Units Subcutaneous TID WC  . lactulose  30 g Oral QID  . pantoprazole  40 mg Oral BID  . sodium chloride flush  3 mL Intravenous Once  . spironolactone  25 mg Oral Daily   Continuous Infusions: . albumin human    . furosemide (LASIX) infusion      Assessment & Plan:   Principal Problem:   Volume overload Active Problems:   Cirrhosis (HCC)   Thrombocytopenia (HCC)   Chronic venous stasis dermatitis of both lower extremities   Hypertension associated with diabetes ([place])   Type 2 diabetes mellitus with diabetic nephropathy, with long-term current use of insulin (HCC)   CKD (chronic kidney disease) stage 4, GFR 15-29 ml/min (HCC)   Hyperlipidemia associated with type  2 diabetes mellitus ([place])   Hepatic cirrhosis with anasarca and visual hallucinations: Patient with significant volume overload in setting of cirrhosis.  Ultrasound scrotum shows significant scrotal edema with chronic right hydrocele.  He reports recent visual hallucinations but is otherwise alert and oriented at time of admission.  Ammonia is 70. -Continue IV Lasix 60 mg twice daily -Monitor strict I/O's and daily weights -Continue lactulose, titrate to 2-3 bowel movements per day -Abdominal ultrasound with trace ascites -TTE on 12/16/2019 showed EF 60-65% with G1DD  CKD stage IV: Chronic and stable.   Continue monitor closely with diuresis. Nephrology consulted as patient had outpatient appointment next week  Insulin-dependent type 2 diabetes: Hypoglycemic on ED arrival.    Held home NovoLog 70/30 and place on  sensitive SSI for now.  Adjust as needed.  Anemia/thrombocytopenia: Chronic and stable without obvious bleeding in setting of cirrhosis and iron deficiency.  Continue to monitor.  Hypertension: Continue home hydralazine and IV Lasix as above.  Chronic venous stasis and edema of both lower extremities: Without obvious superimposed cellulitis.  On IV Lasix as above.  Hyperlipidemia: Continue atorvastatin.  Anxiety: Continue as needed Ativan.  DVT prophylaxis: Lovenox Code Status: Full code, confirmed with patient Family Communication: Discussed with patient, he has discussed with family Disposition Plan: From home, likely discharge to home pending diuresis and symptomatic improvement Barrier: needs IV diureses as po is not sufficient       LOS: 0 days   Time spent: 45 minutes with more than 50% on [place], MD [facility] Pager [phone]  If 7PM-7AM, please contact night-coverage [email or web address] Password [facility] 01/29/2020, 2:48 PM

## 2020-01-29 NOTE — Consult Note (Signed)
[facility]  CONSULT NOTE    Date: 01/29/2020                  Patient Name:  Corey West  MRN: [id number]  DOB: 13-Mar-1951  Age / Sex: 69 y.o., male         PCP: System, Pcp Not In                 Service Requesting Consult: Dr. Kurtis Bushman                 Reason for Consult: Acute renal Failure            History of Present Illness: [name] admitted with significant volume overload. Patient states he had progressive peripheral and scrotal edema. He has been drinking more because he thought this would help. Patient was taking furosemide 80mg  twice a day with no improvement.   Patient has a scheduled appointment to establish with nephrology coming up.   Patient denies use of nonsteroidal anti-inflammatory agents. Patient states he has been diagnosed with hypertension and diabetes for several years.    Medications: Outpatient medications: Medications Prior to Admission  Medication Sig Dispense Refill Last Dose  . atorvastatin (LIPITOR) 10 MG tablet Take 2 tablets (20 mg total) by mouth daily at 6 PM. 30 tablet 0 Past Week at Unknown time  . ferrous sulfate 325 (65 FE) MG tablet Take 325 mg by mouth 2 (two) times daily with a meal.    Past Week at Unknown time  . furosemide (LASIX) 40 MG tablet Take 80 mg by mouth 2 (two) times a day.    Past Week at Unknown time  . hydrALAZINE (APRESOLINE) 25 MG tablet Take 1 tablet (25 mg total) by mouth 3 (three) times daily. (Patient taking differently: Take 25 mg by mouth in the morning and at bedtime. ) 90 tablet 0 Past Week at Unknown time  . insulin aspart protamine - aspart (NOVOLOG 70/30 MIX) (70-30) 100 UNIT/ML FlexPen Inject 84 Units into the skin 2 (two) times daily.    Past Week at Unknown time  . lactulose (CHRONULAC) 10 GM/15ML solution Take 30 g by mouth 4 (four) times daily.    Past Week at Unknown time  . LORazepam (ATIVAN) 0.5 MG tablet Take 1 tablet (0.5 mg total) by mouth every 8 (eight) hours as  needed for anxiety. 20 tablet 0 prn at prn  . losartan (COZAAR) 25 MG tablet Take 25 mg by mouth daily.   Past Week at Unknown time  . Multiple Vitamin (MULTIVITAMIN) tablet Take 1 tablet by mouth daily.   Past Week at Unknown time  . pantoprazole (PROTONIX) 40 MG tablet Take 1 tablet (40 mg total) by mouth 2 (two) times daily. 60 tablet 11 Past Week at Unknown time  . potassium chloride SA (KLOR-CON) 20 MEQ tablet Take 40 mEq by mouth 2 (two) times daily.   Past Week at Unknown time  . traMADol (ULTRAM) 50 MG tablet Take 50 mg by mouth every 6 (six) hours as needed for moderate pain.    prn at prn    Current medications: Current Facility-Administered Medications  Medication Dose Route Frequency Provider Last Rate Last Admin  . albumin human 25 % solution 25 g  25 g Intravenous Q8H Keaton Stirewalt, MD      . atorvastatin (LIPITOR) tablet 20 mg  20 mg Oral q1800 Lenore Cordia, MD      . enoxaparin (  LOVENOX) injection 40 mg  40 mg Subcutaneous Q12H Zada Finders R, MD   40 mg at 01/29/20 0830  . furosemide (LASIX) 250 mg in dextrose 5 % 250 mL (1 mg/mL) infusion  8 mg/hr Intravenous Continuous Danijela Vessey, MD 8 mL/hr at 01/29/20 1537 8 mg/hr at 01/29/20 1537  . hydrALAZINE (APRESOLINE) tablet 25 mg  25 mg Oral TID Lenore Cordia, MD   25 mg at 01/29/20 1537  . insulin aspart (novoLOG) injection 0-9 Units  0-9 Units Subcutaneous TID WC Lenore Cordia, MD   1 Units at 01/29/20 1244  . lactulose (CHRONULAC) 10 GM/15ML solution 30 g  30 g Oral QID Lenore Cordia, MD   30 g at 01/29/20 1243  . LORazepam (ATIVAN) tablet 0.5 mg  0.5 mg Oral Q8H PRN Zada Finders R, MD   0.5 mg at 01/29/20 1244  . ondansetron (ZOFRAN) tablet 4 mg  4 mg Oral Q6H PRN Lenore Cordia, MD       Or  . ondansetron (ZOFRAN) injection 4 mg  4 mg Intravenous Q6H PRN Zada Finders R, MD      . pantoprazole (PROTONIX) EC tablet 40 mg  40 mg Oral BID Lenore Cordia, MD   40 mg at 01/29/20 0831  . sodium chloride flush  (NS) 0.9 % injection 3 mL  3 mL Intravenous Once Lilia Pro., MD      . spironolactone (ALDACTONE) tablet 25 mg  25 mg Oral Daily Valmai Vandenberghe, MD   25 mg at 01/29/20 1537  . traMADol (ULTRAM) tablet 50 mg  50 mg Oral Q6H PRN Lenore Cordia, MD   50 mg at 01/29/20 1249      Allergies: No Known Allergies    Past Medical History: Past Medical History:  Diagnosis Date  . Anemia   . Anxiety    controlled;   . Arthritis   . AVM (arteriovenous malformation) of stomach, acquired with hemorrhage   . Barrett's esophagus   . Chronic kidney disease    renal infufficiency  . Cirrhosis ([place])   . Depression    controlled;   Marland Kitchen Diabetes mellitus without complication ([place])    not controlled, taking insulin but sugar continues to run high;   . Edema   . Esophageal varices ([place])   . GAVE (gastric antral vascular ectasia)   . GERD (gastroesophageal reflux disease)   . History of hiatal hernia   . Hyperlipidemia   . Hypertension    controlled well;   . Liver cirrhosis ([place])   . Nephropathy, diabetic ([place])   . Obesity   . Pancytopenia ([place])   . Polyp, stomach    with chronic blood loss  . Sleep apnea    does not wear a cpap, Medicare would not pay for it   . Venous stasis dermatitis of both lower extremities   . Venous stasis of both lower extremities    with cellulitis     Past Surgical History: Past Surgical History:  Procedure Laterality Date  . ESOPHAGOGASTRODUODENOSCOPY N/A 02/09/2015   Procedure: ESOPHAGOGASTRODUODENOSCOPY (EGD);  Surgeon: Manya Silvas, MD;  Location: [facility] ENDOSCOPY;  Service: Endoscopy;  Laterality: N/A;  . ESOPHAGOGASTRODUODENOSCOPY N/A 07/22/2015   Procedure: ESOPHAGOGASTRODUODENOSCOPY (EGD);  Surgeon: Manya Silvas, MD;  Location: [facility] ENDOSCOPY;  Service: Endoscopy;  Laterality: N/A;  . ESOPHAGOGASTRODUODENOSCOPY N/A 06/26/2017   Procedure: ESOPHAGOGASTRODUODENOSCOPY (EGD);  Surgeon: Manya Silvas, MD;  Location: [facility] ENDOSCOPY;  Service:  Endoscopy;  Laterality: N/A;  .  ESOPHAGOGASTRODUODENOSCOPY N/A 06/27/2017   Procedure: ESOPHAGOGASTRODUODENOSCOPY (EGD);  Surgeon: Manya Silvas, MD;  Location: [facility] ENDOSCOPY;  Service: Endoscopy;  Laterality: N/A;  . ESOPHAGOGASTRODUODENOSCOPY N/A 06/28/2017   Procedure: ESOPHAGOGASTRODUODENOSCOPY (EGD);  Surgeon: Manya Silvas, MD;  Location: [facility] ENDOSCOPY;  Service: Endoscopy;  Laterality: N/A;  . ESOPHAGOGASTRODUODENOSCOPY Bilateral 10/11/2017   Procedure: ESOPHAGOGASTRODUODENOSCOPY (EGD);  Surgeon: Manya Silvas, MD;  Location: [facility] ENDOSCOPY;  Service: Endoscopy;  Laterality: Bilateral;  . ESOPHAGOGASTRODUODENOSCOPY N/A 12/04/2017   Procedure: ESOPHAGOGASTRODUODENOSCOPY (EGD);  Surgeon: Manya Silvas, MD;  Location: [facility] ENDOSCOPY;  Service: Endoscopy;  Laterality: N/A;  . ESOPHAGOGASTRODUODENOSCOPY N/A 03/06/2019   Procedure: ESOPHAGOGASTRODUODENOSCOPY (EGD);  Surgeon: Toledo, Benay Pike, MD;  Location: [facility] ENDOSCOPY;  Service: Gastroenterology;  Laterality: N/A;  . ESOPHAGOGASTRODUODENOSCOPY (EGD) WITH PROPOFOL N/A 07/20/2015   Procedure: ESOPHAGOGASTRODUODENOSCOPY (EGD) WITH PROPOFOL;  Surgeon: Manya Silvas, MD;  Location: [facility] ENDOSCOPY;  Service: Endoscopy;  Laterality: N/A;  . ESOPHAGOGASTRODUODENOSCOPY (EGD) WITH PROPOFOL N/A 09/16/2015   Procedure: ESOPHAGOGASTRODUODENOSCOPY (EGD) WITH PROPOFOL;  Surgeon: Manya Silvas, MD;  Location: [facility] ENDOSCOPY;  Service: Endoscopy;  Laterality: N/A;  . ESOPHAGOGASTRODUODENOSCOPY (EGD) WITH PROPOFOL N/A 03/16/2016   Procedure: ESOPHAGOGASTRODUODENOSCOPY (EGD) WITH PROPOFOL;  Surgeon: Manya Silvas, MD;  Location: [facility] ENDOSCOPY;  Service: Endoscopy;  Laterality: N/A;  . ESOPHAGOGASTRODUODENOSCOPY (EGD) WITH PROPOFOL N/A 09/14/2016   Procedure: ESOPHAGOGASTRODUODENOSCOPY (EGD) WITH PROPOFOL;  Surgeon: Manya Silvas, MD;  Location: [facility] ENDOSCOPY;  Service: Endoscopy;  Laterality: N/A;  . ESOPHAGOGASTRODUODENOSCOPY (EGD)  WITH PROPOFOL N/A 11/05/2016   Procedure: ESOPHAGOGASTRODUODENOSCOPY (EGD) WITH PROPOFOL;  Surgeon: Manya Silvas, MD;  Location: [facility] ENDOSCOPY;  Service: Endoscopy;  Laterality: N/A;  . ESOPHAGOGASTRODUODENOSCOPY (EGD) WITH PROPOFOL N/A 02/06/2017   Procedure: ESOPHAGOGASTRODUODENOSCOPY (EGD) WITH PROPOFOL;  Surgeon: Manya Silvas, MD;  Location: [facility] ENDOSCOPY;  Service: Endoscopy;  Laterality: N/A;  . ESOPHAGOGASTRODUODENOSCOPY (EGD) WITH PROPOFOL N/A 04/24/2017   Procedure: ESOPHAGOGASTRODUODENOSCOPY (EGD) WITH PROPOFOL;  Surgeon: Manya Silvas, MD;  Location: [facility] ENDOSCOPY;  Service: Endoscopy;  Laterality: N/A;  . ESOPHAGOGASTRODUODENOSCOPY (EGD) WITH PROPOFOL N/A 07/31/2017   Procedure: ESOPHAGOGASTRODUODENOSCOPY (EGD) WITH PROPOFOL;  Surgeon: Manya Silvas, MD;  Location: [facility] ENDOSCOPY;  Service: Endoscopy;  Laterality: N/A;  . ESOPHAGOGASTRODUODENOSCOPY (EGD) WITH PROPOFOL N/A 08/08/2018   Procedure: ESOPHAGOGASTRODUODENOSCOPY (EGD) WITH PROPOFOL;  Surgeon: Manya Silvas, MD;  Location: [facility] ENDOSCOPY;  Service: Endoscopy;  Laterality: N/A;  . GIVENS CAPSULE STUDY N/A 12/05/2017   Procedure: GIVENS CAPSULE STUDY;  Surgeon: Manya Silvas, MD;  Location: [facility] ENDOSCOPY;  Service: Endoscopy;  Laterality: N/A;  . TONSILLECTOMY    . TONSILLECTOMY AND ADENOIDECTOMY    . ULNAR NERVE TRANSPOSITION    . UVULOPALATOPHARYNGOPLASTY       Family History: Family History  Problem Relation Age of Onset  . Diabetes Other   . Transient ischemic attack Father   . CAD Father      Social History: Social History   Socioeconomic History  . Marital status: Single    Spouse name: Not on file  . Number of children: Not on file  . Years of education: Not on file  . Highest education level: Not on file  Occupational History  . Occupation: disabled  Tobacco Use  . Smoking status: Former Smoker    Years: 20.00    Types: Cigars, Cigarettes  . Smokeless tobacco: Never Used   Substance and Sexual Activity  . Alcohol use: No    Comment: stopped 15 years ago  . Drug use: No  .  Sexual activity: Not Currently  Other Topics Concern  . Not on file  Social History Narrative   Lives alone, neighbor helps occasionally    Social Determinants of Health   Financial Resource Strain:   . Difficulty of Paying Living Expenses:   Food Insecurity:   . Worried About Charity fundraiser in the Last Year:   . Arboriculturist in the Last Year:   Transportation Needs:   . Film/video editor (Medical):   Marland Kitchen Lack of Transportation (Non-Medical):   Physical Activity:   . Days of Exercise per Week:   . Minutes of Exercise per Session:   Stress:   . Feeling of Stress :   Social Connections:   . Frequency of Communication with Friends and Family:   . Frequency of Social Gatherings with Friends and Family:   . Attends Religious Services:   . Active Member of Clubs or Organizations:   . Attends Archivist Meetings:   Marland Kitchen Marital Status:   Intimate Partner Violence:   . Fear of Current or Ex-Partner:   . Emotionally Abused:   Marland Kitchen Physically Abused:   . Sexually Abused:      Review of Systems: Review of Systems  Constitutional: Negative.  Negative for chills, diaphoresis, fever, malaise/fatigue and weight loss.  HENT: Negative.  Negative for congestion, ear discharge, ear pain, hearing loss, nosebleeds, sinus pain, sore throat and tinnitus.   Eyes: Negative.  Negative for blurred vision, double vision, photophobia, pain, discharge and redness.  Respiratory: Positive for shortness of breath and wheezing. Negative for cough, hemoptysis, sputum production and stridor.   Cardiovascular: Positive for orthopnea and leg swelling. Negative for chest pain, palpitations, claudication and PND.  Gastrointestinal: Negative.  Negative for abdominal pain, blood in stool, constipation, diarrhea, heartburn, melena, nausea and vomiting.  Genitourinary: Positive for frequency.  Negative for dysuria, flank pain, hematuria and urgency.  Musculoskeletal: Negative.  Negative for back pain, falls, joint pain, myalgias and neck pain.  Skin: Negative.  Negative for itching and rash.  Neurological: Negative.  Negative for dizziness, tingling, tremors, sensory change, speech change, focal weakness, seizures, loss of consciousness, weakness and headaches.  Endo/Heme/Allergies: Positive for polydipsia. Negative for environmental allergies. Does not bruise/bleed easily.  Psychiatric/Behavioral: Negative.  Negative for depression, hallucinations, memory loss, substance abuse and suicidal ideas. The patient is not nervous/anxious and does not have insomnia.     Vital Signs: Blood pressure (!) 143/57, pulse 71, temperature 97.9 F (36.6 C), temperature source Oral, resp. rate 16, height 5\' 9"  (1.753 m), weight (!) 145.3 kg, SpO2 99 %.  Weight trends: Filed Weights   01/28/20 1558 01/29/20 0443  Weight: (!) 140.6 kg (!) 145.3 kg    Physical Exam: General: NAD,   Head: Normocephalic, atraumatic. Moist oral mucosal membranes  Eyes: Anicteric, PERRL  Neck: Supple, trachea midline  Lungs:  Crackles  Heart: Regular   Abdomen:  Soft, nontender, +++abdominal wall edema  Extremities:  +++ peripheral edema.  Neurologic: Nonfocal, moving all four extremities  Skin: No lesions        Lab results: Basic Metabolic Panel: Recent Labs  Lab 01/28/20 1605 01/29/20 0539  NA 142 143  K 4.9 4.3  CL 113* 111  CO2 23 23  GLUCOSE 61* 124*  BUN 42* 41*  CREATININE 2.43* 2.46*  CALCIUM 9.2 9.1    Liver Function Tests: Recent Labs  Lab 01/28/20 1605 01/29/20 0539  AST 35 31  ALT 29 24  ALKPHOS 89  82  BILITOT 0.9 0.8  PROT 7.3 6.5  ALBUMIN 3.0* 2.7*   No results for input(s): LIPASE, AMYLASE in the last 168 hours. Recent Labs  Lab 01/28/20 1948  AMMONIA 70*    CBC: Recent Labs  Lab 01/28/20 1605 01/29/20 0539  WBC 4.7 4.3  HGB 7.8* 7.6*  HCT 25.6* 24.1*   MCV 94.8 92.7  PLT 122* 114*    Cardiac Enzymes: No results for input(s): CKTOTAL, CKMB, CKMBINDEX, TROPONINI in the last 168 hours.  BNP: Invalid input(s): POCBNP  CBG: Recent Labs  Lab 01/28/20 1829 01/28/20 2049 01/28/20 2319 01/29/20 0741 01/29/20 1217  GLUCAP 72 94 82 102* 140*    Microbiology: Results for orders placed or performed during the hospital encounter of 01/28/20  Respiratory Panel by RT PCR (Flu A&B, Covid) - Nasopharyngeal Swab     Status: None   Collection Time: 01/28/20 10:34 PM   Specimen: Nasopharyngeal Swab  Result Value Ref Range Status   SARS Coronavirus 2 by RT PCR NEGATIVE NEGATIVE Final    Comment: (NOTE) SARS-CoV-2 target nucleic acids are NOT DETECTED. The SARS-CoV-2 RNA is generally detectable in upper respiratoy specimens during the acute phase of infection. The lowest concentration of SARS-CoV-2 viral copies this assay can detect is 131 copies/mL. A negative result does not preclude SARS-Cov-2 infection and should not be used as the sole basis for treatment or other patient management decisions. A negative result may occur with  improper specimen collection/handling, submission of specimen other than nasopharyngeal swab, presence of viral mutation(s) within the areas targeted by this assay, and inadequate number of viral copies (<131 copies/mL). A negative result must be combined with clinical observations, patient history, and epidemiological information. The expected result is Negative. Fact Sheet for Patients:  PinkCheek.be Fact Sheet for Healthcare Providers:  GravelBags.it This test is not yet ap proved or cleared by the [place] FDA and  has been authorized for detection and/or diagnosis of SARS-CoV-2 by FDA under an Emergency Use Authorization (EUA). This EUA will remain  in effect (meaning this test can be used) for the duration of the COVID-19 declaration under  Section 564(b)(1) of the Act, 21 U.S.C. section 360bbb-3(b)(1), unless the authorization is terminated or revoked sooner.    Influenza A by PCR NEGATIVE NEGATIVE Final   Influenza B by PCR NEGATIVE NEGATIVE Final    Comment: (NOTE) The Xpert Xpress SARS-CoV-2/FLU/RSV assay is intended as an aid in  the diagnosis of influenza from Nasopharyngeal swab specimens and  should not be used as a sole basis for treatment. Nasal washings and  aspirates are unacceptable for Xpert Xpress SARS-CoV-2/FLU/RSV  testing. Fact Sheet for Patients: PinkCheek.be Fact Sheet for Healthcare Providers: GravelBags.it This test is not yet approved or cleared by the [place] FDA and  has been authorized for detection and/or diagnosis of SARS-CoV-2 by  FDA under an Emergency Use Authorization (EUA). This EUA will remain  in effect (meaning this test can be used) for the duration of the  Covid-19 declaration under Section 564(b)(1) of the Act, 21  U.S.C. section 360bbb-3(b)(1), unless the authorization is  terminated or revoked. Performed at [facility], [address] [place], [place] [address]     Coagulation Studies: Recent Labs    01/28/20 1922 01/29/20 0539  LABPROT 14.8 15.6*  INR 1.2 1.3*    Urinalysis: Recent Labs    01/28/20 1607  COLORURINE STRAW*  LABSPEC 1.006  PHURINE 5.0  GLUCOSEU NEGATIVE  HGBUR NEGATIVE  BILIRUBINUR NEGATIVE  KETONESUR NEGATIVE  PROTEINUR 30*  NITRITE NEGATIVE  LEUKOCYTESUR NEGATIVE      Imaging: CT Head Wo Contrast  Result Date: 01/28/2020 CLINICAL DATA:  Hallucinations. Possible urinary tract infection. EXAM: CT HEAD WITHOUT CONTRAST TECHNIQUE: Contiguous axial images were obtained from the base of the skull through the vertex without intravenous contrast. COMPARISON:  04/28/2019 FINDINGS: Brain: No evidence of acute infarction, hemorrhage, hydrocephalus, extra-axial collection or  mass lesion/mass effect. Vascular: No hyperdense vessel or unexpected calcification. Skull: Normal. Negative for fracture or focal lesion. Sinuses/Orbits: No acute finding. Other: None. IMPRESSION: Negative exam. Electronically Signed   By: Nolon Nations M.D.   On: 01/28/2020 19:16   DG Chest Port 1 View  Result Date: 01/28/2020 CLINICAL DATA:  Intermittent shortness of breath, diabetes EXAM: PORTABLE CHEST 1 VIEW COMPARISON:  12/14/2019 FINDINGS: The heart size and mediastinal contours are stable. Pulmonary vascular congestion with mild interstitial prominence, similar to the prior study. No large pleural fluid collection. Right costophrenic angle was not included on the film. No pneumothorax. The visualized skeletal structures are unremarkable. IMPRESSION: Pulmonary vascular congestion with mild interstitial prominence, similar to the prior study. Findings may reflect mild edema. Electronically Signed   By: Davina Poke D.O.   On: 01/28/2020 20:20   [place] ASCITES (ABDOMEN LIMITED)  Result Date: 01/29/2020 CLINICAL DATA:  Cirrhosis. EXAM: ULTRASOUND ABDOMEN LIMITED COMPARISON:  None. FINDINGS: All 4 quadrants of the abdomen were cysts by ultrasound. There is trace ascites within the RIGHT lower quadrant and LEFT lower quadrant. No additional ascites demonstrated. IMPRESSION: Trace ascites. Electronically Signed   By: Franki Cabot M.D.   On: 01/29/2020 08:23   US SCROTUM W/DOPPLER  Result Date: 01/28/2020 CLINICAL DATA:  Scrotal swelling EXAM: SCROTAL ULTRASOUND DOPPLER ULTRASOUND OF THE TESTICLES TECHNIQUE: Complete ultrasound examination of the testicles, epididymis, and other scrotal structures was performed. Color and spectral Doppler ultrasound were also utilized to evaluate blood flow to the testicles. COMPARISON:  09/27/2013 FINDINGS: Right testicle Measurements: 2.8 x 1.5 x 1.8 cm. The testicle is small similar to that seen on prior exam. 7 mm hypoechoic focus is noted centrally within  testicle are decreased in size when compared with the prior exam. Left testicle Measurements: 4.0 x 2.4 x 2.1 cm. No mass or microlithiasis visualized. Right epididymis:  Normal in size and appearance. Left epididymis:  8 mm cyst is noted within the left epididymis. Hydrocele:  Large right hydrocele is noted. Varicocele:  None visualized. Pulsed Doppler interrogation of both testes demonstrates normal low resistance arterial and venous waveforms bilaterally. Diffuse scrotal edema is noted. IMPRESSION: The current scrotal swelling is related to diffuse scrotal edema. Chronic large right-sided hydrocele is again seen. Small hypoechoic focus within the right testicle which is decreased in size when compared with the prior exam from 2014 and likely of a benign etiology. Electronically Signed   By: Inez Catalina M.D.   On: 01/28/2020 20:50      Assessment & Plan: Mr. EAGAN SHIFFLETT is a 69 y.o. 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 on 01/28/2020 for 01/28/2020 for Orthopnea [R06.01] Scrotal swelling [N50.89] Volume overload [E87.70] Increased ammonia level [R79.89]  1. Acute renal failure on chronic kidney disease stage IV with proteinuria: baseline creatinine of 2.5, GFR 26 01/14/2020.  Not currently on ACE-I/ARB at home.  Chronic kidney disease secondary to diabetes, hypertension.  Acute renal failure  secondary to acute cardiorenal syndrome  2. Acute exacerbation of diastolic congestive heart failure with hypertension and new onset atrial flutter.   3. Anemia of chronic kidney disease and history of GI bleed. Thrombocytopenia. Hemoglobin 7.6.   4. Diabetes mellitus type II with chronic kidney disease: insulin dependent. Hemoglobin A1c of 6.6% on 12/15/2019  Plan - start furosemide gtt - start spironolactone - discontinue potassium   - fluid restriction.  - IV albumin.   LOS: 0 Loxley Schmale 4/30/20213:53 PM

## 2020-01-29 NOTE — Progress Notes (Signed)
Patient in pain due to scrotal edema. Patient has had a loose stool due to lactulose. States he has no control and when BM hits his scrotal area it is extremely painful. Patient is requesting a rectal tube. I have messaged the provider at this point. Will continue to monitor. Safety checks completed and call light placed within reach. Ativan and tramadol given at this time for comfort.

## 2020-01-29 NOTE — Progress Notes (Signed)
Assume care of patient this evening.  Patients bedding appeared to be soiled. Rectal tube was re inflated with water. Patient cleaned up at this point and barrier cream applied. New eternal cath replaced and attempted to tape as it does not stay in place due to swelling. No acute findings noted at this time safety checks completed and call light placed within reach will continue to monitor.

## 2020-01-29 NOTE — Progress Notes (Signed)
Assumed care of patient this evening. Patient alert and oriented. He did have issues trying to get out of the bed and we needed to max transfer. Patient has significant scrotal pain and edema. Patient given pain medication throughout the night and NS bag placed under scrotum for elevation. Continues to have an external cath. Rectal tube also placed as patient having loose stools as a a result of lactulose. Patient resting at this time safety check completed and call light placed within reach.

## 2020-01-29 NOTE — Consult Note (Addendum)
Cardiology Consultation Note    Patient ID: Corey West, MRN: [id number], DOB/AGE: 1950/12/30 69 y.o. Admit date: 01/28/2020   Date of Consult: 01/29/2020 Primary Physician: System, Pcp Not In Primary Cardiologist:    Chief Complaint: chf Reason for Consultation: chf Requesting MD: Dr. Kurtis Bushman  HPI: Corey West is a 69 y.o. male with history of hepatic cirrhosis, portal hypertension, dm, ckd iv, hypertension, hyperlipidemia with chronic lower extremety edema who was admitted with progressive sob and lower extremety edema. CXR showed possible mild pulmonary edema. BNP was 217 down from 256 1 month ago. Has chronic renal insufficiency with creatinine of 2.43. EKG showed afib with controlled vr. He conplained of severe peripheral and scrotal edema. He has been on furosemide at 80 mg bid. He is currently on furosemide drip. On hydralazine 25 mg tid. And spironolactone. He states his legs have markedly increased in swelling over the past few days.  He is followed by Dr. Ola Spurr as an outpatient.  He has been treated with losartan for blood pressure and diuretics.  He had [place] boots placed for severe lower extremity edema with weeping.  This was present April 8.  He had similar findings several months ago so this does not appear to be an acute change.  He did have evidence of lower extremity cellulitis and was treated with oral antibiotics for this.  Seen by the vascular clinic and wound center.  Attempt at oral treatment with Lasix 80 twice daily and metolazone was done.  He had an echocardiogram which was read as showing diastolic dysfunction with normal ejection fraction.  He is currently on a drip.  He has diuresed 2.8 L since admission.  Past Medical History:  Diagnosis Date  . Anemia   . Anxiety    controlled;   . Arthritis   . AVM (arteriovenous malformation) of stomach, acquired with hemorrhage   . Barrett's esophagus   . Chronic kidney disease    renal infufficiency  . Cirrhosis ([place])    . Depression    controlled;   Marland Kitchen Diabetes mellitus without complication ([place])    not controlled, taking insulin but sugar continues to run high;   . Edema   . Esophageal varices ([place])   . GAVE (gastric antral vascular ectasia)   . GERD (gastroesophageal reflux disease)   . History of hiatal hernia   . Hyperlipidemia   . Hypertension    controlled well;   . Liver cirrhosis ([place])   . Nephropathy, diabetic ([place])   . Obesity   . Pancytopenia ([place])   . Polyp, stomach    with chronic blood loss  . Sleep apnea    does not wear a cpap, Medicare would not pay for it   . Venous stasis dermatitis of both lower extremities   . Venous stasis of both lower extremities    with cellulitis      Surgical History:  Past Surgical History:  Procedure Laterality Date  . ESOPHAGOGASTRODUODENOSCOPY N/A 02/09/2015   Procedure: ESOPHAGOGASTRODUODENOSCOPY (EGD);  Surgeon: Manya Silvas, MD;  Location: [facility] ENDOSCOPY;  Service: Endoscopy;  Laterality: N/A;  . ESOPHAGOGASTRODUODENOSCOPY N/A 07/22/2015   Procedure: ESOPHAGOGASTRODUODENOSCOPY (EGD);  Surgeon: Manya Silvas, MD;  Location[facility] ENDOSCOPY;  Service: Endoscopy;  Laterality: N/A;  . ESOPHAGOGASTRODUODENOSCOPY N/A 06/26/2017   Procedure: ESOPHAGOGASTRODUODENOSCOPY (EGD);  Surgeon: Manya Silvas, MD;  Location: [facility] ENDOSCOPY;  Service: Endoscopy;  Laterality: N/A;  . ESOPHAGOGASTRODUODENOSCOPY N/A 06/27/2017   Procedure: ESOPHAGOGASTRODUODENOSCOPY (EGD);  Surgeon: [name]  T, MD;  Location: [facility] ENDOSCOPY;  Service: Endoscopy;  Laterality: N/A;  . ESOPHAGOGASTRODUODENOSCOPY N/A 06/28/2017   Procedure: ESOPHAGOGASTRODUODENOSCOPY (EGD);  Surgeon: Manya Silvas, MD;  Location: [facility] ENDOSCOPY;  Service: Endoscopy;  Laterality: N/A;  . ESOPHAGOGASTRODUODENOSCOPY Bilateral 10/11/2017   Procedure: ESOPHAGOGASTRODUODENOSCOPY (EGD);  Surgeon: Manya Silvas, MD;  Location: [facility] ENDOSCOPY;  Service: Endoscopy;  Laterality: Bilateral;   . ESOPHAGOGASTRODUODENOSCOPY N/A 12/04/2017   Procedure: ESOPHAGOGASTRODUODENOSCOPY (EGD);  Surgeon: Manya Silvas, MD;  Location: [facility] ENDOSCOPY;  Service: Endoscopy;  Laterality: N/A;  . ESOPHAGOGASTRODUODENOSCOPY N/A 03/06/2019   Procedure: ESOPHAGOGASTRODUODENOSCOPY (EGD);  Surgeon: [place], Benay Pike, MD;  Location: [facility] ENDOSCOPY;  Service: Gastroenterology;  Laterality: N/A;  . ESOPHAGOGASTRODUODENOSCOPY (EGD) WITH PROPOFOL N/A 07/20/2015   Procedure: ESOPHAGOGASTRODUODENOSCOPY (EGD) WITH PROPOFOL;  Surgeon: Manya Silvas, MD;  Location: [facility] ENDOSCOPY;  Service: Endoscopy;  Laterality: N/A;  . ESOPHAGOGASTRODUODENOSCOPY (EGD) WITH PROPOFOL N/A 09/16/2015   Procedure: ESOPHAGOGASTRODUODENOSCOPY (EGD) WITH PROPOFOL;  Surgeon: Manya Silvas, MD;  Location: [facility] ENDOSCOPY;  Service: Endoscopy;  Laterality: N/A;  . ESOPHAGOGASTRODUODENOSCOPY (EGD) WITH PROPOFOL N/A 03/16/2016   Procedure: ESOPHAGOGASTRODUODENOSCOPY (EGD) WITH PROPOFOL;  Surgeon: Manya Silvas, MD;  Location: [facility] ENDOSCOPY;  Service: Endoscopy;  Laterality: N/A;  . ESOPHAGOGASTRODUODENOSCOPY (EGD) WITH PROPOFOL N/A 09/14/2016   Procedure: ESOPHAGOGASTRODUODENOSCOPY (EGD) WITH PROPOFOL;  Surgeon: Manya Silvas, MD;  Location: [facility] ENDOSCOPY;  Service: Endoscopy;  Laterality: N/A;  . ESOPHAGOGASTRODUODENOSCOPY (EGD) WITH PROPOFOL N/A 11/05/2016   Procedure: ESOPHAGOGASTRODUODENOSCOPY (EGD) WITH PROPOFOL;  Surgeon: Manya Silvas, MD;  Location: [facility] ENDOSCOPY;  Service: Endoscopy;  Laterality: N/A;  . ESOPHAGOGASTRODUODENOSCOPY (EGD) WITH PROPOFOL N/A 02/06/2017   Procedure: ESOPHAGOGASTRODUODENOSCOPY (EGD) WITH PROPOFOL;  Surgeon: Manya Silvas, MD;  Location: [facility] ENDOSCOPY;  Service: Endoscopy;  Laterality: N/A;  . ESOPHAGOGASTRODUODENOSCOPY (EGD) WITH PROPOFOL N/A 04/24/2017   Procedure: ESOPHAGOGASTRODUODENOSCOPY (EGD) WITH PROPOFOL;  Surgeon: Manya Silvas, MD;  Location: [facility] ENDOSCOPY;  Service: Endoscopy;   Laterality: N/A;  . ESOPHAGOGASTRODUODENOSCOPY (EGD) WITH PROPOFOL N/A 07/31/2017   Procedure: ESOPHAGOGASTRODUODENOSCOPY (EGD) WITH PROPOFOL;  Surgeon: Manya Silvas, MD;  Location: [facility] ENDOSCOPY;  Service: Endoscopy;  Laterality: N/A;  . ESOPHAGOGASTRODUODENOSCOPY (EGD) WITH PROPOFOL N/A 08/08/2018   Procedure: ESOPHAGOGASTRODUODENOSCOPY (EGD) WITH PROPOFOL;  Surgeon: Manya Silvas, MD;  Location: [facility] ENDOSCOPY;  Service: Endoscopy;  Laterality: N/A;  . GIVENS CAPSULE STUDY N/A 12/05/2017   Procedure: GIVENS CAPSULE STUDY;  Surgeon: Manya Silvas, MD;  Location: [facility] ENDOSCOPY;  Service: Endoscopy;  Laterality: N/A;  . TONSILLECTOMY    . TONSILLECTOMY AND ADENOIDECTOMY    . ULNAR NERVE TRANSPOSITION    . UVULOPALATOPHARYNGOPLASTY       Home Meds: Prior to Admission medications   Medication Sig Start Date End Date Taking? Authorizing Provider  atorvastatin (LIPITOR) 10 MG tablet Take 2 tablets (20 mg total) by mouth daily at 6 PM. 12/17/19  Yes Nolberto Hanlon, MD  ferrous sulfate 325 (65 FE) MG tablet Take 325 mg by mouth 2 (two) times daily with a meal.    Yes [provider]  furosemide (LASIX) 40 MG tablet Take 80 mg by mouth 2 (two) times a day.    Yes [provider]  hydrALAZINE (APRESOLINE) 25 MG tablet Take 1 tablet (25 mg total) by mouth 3 (three) times daily. Patient taking differently: Take 25 mg by mouth in the morning and at bedtime.  12/17/19  Yes Nolberto Hanlon, MD  insulin aspart protamine - aspart (NOVOLOG 70/30 MIX) (70-30) 100 UNIT/ML FlexPen Inject 84 Units  into the skin 2 (two) times daily.    Yes [provider]  lactulose (CHRONULAC) 10 GM/15ML solution Take 30 g by mouth 4 (four) times daily.    Yes [provider]  LORazepam (ATIVAN) 0.5 MG tablet Take 1 tablet (0.5 mg total) by mouth every 8 (eight) hours as needed for anxiety. 10/18/18  Yes Gladstone Lighter, MD  losartan (COZAAR) 25 MG tablet Take 25 mg by mouth daily.  01/07/20  Yes [provider]  Multiple Vitamin (MULTIVITAMIN) tablet Take 1 tablet by mouth daily.   Yes [provider]  pantoprazole (PROTONIX) 40 MG tablet Take 1 tablet (40 mg total) by mouth 2 (two) times daily. 03/06/19 03/05/20 Yes Dustin Flock, MD  potassium chloride SA (KLOR-CON) 20 MEQ tablet Take 40 mEq by mouth 2 (two) times daily. 01/08/20  Yes [provider]  traMADol (ULTRAM) 50 MG tablet Take 50 mg by mouth every 6 (six) hours as needed for moderate pain.    Yes [provider]    Inpatient Medications:  . atorvastatin  20 mg Oral q1800  . enoxaparin (LOVENOX) injection  40 mg Subcutaneous Q12H  . hydrALAZINE  25 mg Oral TID  . insulin aspart  0-9 Units Subcutaneous TID WC  . lactulose  30 g Oral QID  . pantoprazole  40 mg Oral BID  . sodium chloride flush  3 mL Intravenous Once  . spironolactone  25 mg Oral Daily   . albumin human 25 g (01/29/20 1710)  . furosemide (LASIX) infusion 8 mg/hr (01/29/20 1537)    Allergies: No Known Allergies  Social History   Socioeconomic History  . Marital status: Single    Spouse name: Not on file  . Number of children: Not on file  . Years of education: Not on file  . Highest education level: Not on file  Occupational History  . Occupation: disabled  Tobacco Use  . Smoking status: Former Smoker    Years: 20.00    Types: Cigars, Cigarettes  . Smokeless tobacco: Never Used  Substance and Sexual Activity  . Alcohol use: No    Comment: stopped 15 years ago  . Drug use: No  . Sexual activity: Not Currently  Other Topics Concern  . Not on file  Social History Narrative   Lives alone, neighbor helps occasionally    Social Determinants of Health   Financial Resource Strain:   . Difficulty of Paying Living Expenses:   Food Insecurity:   . Worried About Charity fundraiser in the Last Year:   . Arboriculturist in the Last Year:   Transportation Needs:   . Film/video editor (Medical):    Marland Kitchen Lack of Transportation (Non-Medical):   Physical Activity:   . Days of Exercise per Week:   . Minutes of Exercise per Session:   Stress:   . Feeling of Stress :   Social Connections:   . Frequency of Communication with Friends and Family:   . Frequency of Social Gatherings with Friends and Family:   . Attends Religious Services:   . Active Member of Clubs or Organizations:   . Attends Archivist Meetings:   Marland Kitchen Marital Status:   Intimate Partner Violence:   . Fear of Current or Ex-Partner:   . Emotionally Abused:   Marland Kitchen Physically Abused:   . Sexually Abused:      Family History  Problem Relation Age of Onset  . Diabetes Other   . Transient ischemic  attack Father   . CAD Father      Review of Systems: A 12-system review of systems was performed and is negative except as noted in the HPI.  Labs: No results for input(s): CKTOTAL, CKMB, TROPONINI in the last 72 hours. Lab Results  Component Value Date   WBC 4.3 01/29/2020   HGB 7.6 (L) 01/29/2020   HCT 24.1 (L) 01/29/2020   MCV 92.7 01/29/2020   PLT 114 (L) 01/29/2020    Recent Labs  Lab 01/29/20 0539  NA 143  K 4.3  CL 111  CO2 23  BUN 41*  CREATININE 2.46*  CALCIUM 9.1  PROT 6.5  BILITOT 0.8  ALKPHOS 82  ALT 24  AST 31  GLUCOSE 124*   Lab Results  Component Value Date   CHOL 141 12/15/2019   HDL 37 (L) 12/15/2019   LDLCALC 93 12/15/2019   TRIG 56 12/15/2019   No results found for: DDIMER  Radiology/Studies:  CT Head Wo Contrast  Result Date: 01/28/2020 CLINICAL DATA:  Hallucinations. Possible urinary tract infection. EXAM: CT HEAD WITHOUT CONTRAST TECHNIQUE: Contiguous axial images were obtained from the base of the skull through the vertex without intravenous contrast. COMPARISON:  04/28/2019 FINDINGS: Brain: No evidence of acute infarction, hemorrhage, hydrocephalus, extra-axial collection or mass lesion/mass effect. Vascular: No hyperdense vessel or unexpected calcification. Skull:  Normal. Negative for fracture or focal lesion. Sinuses/Orbits: No acute finding. Other: None. IMPRESSION: Negative exam. Electronically Signed   By: Nolon Nations M.D.   On: 01/28/2020 19:16   DG Chest Port 1 View  Result Date: 01/28/2020 CLINICAL DATA:  Intermittent shortness of breath, diabetes EXAM: PORTABLE CHEST 1 VIEW COMPARISON:  12/14/2019 FINDINGS: The heart size and mediastinal contours are stable. Pulmonary vascular congestion with mild interstitial prominence, similar to the prior study. No large pleural fluid collection. Right costophrenic angle was not included on the film. No pneumothorax. The visualized skeletal structures are unremarkable. IMPRESSION: Pulmonary vascular congestion with mild interstitial prominence, similar to the prior study. Findings may reflect mild edema. Electronically Signed   By: Davina Poke D.O.   On: 01/28/2020 20:20   VAS [place] ABI WITH/WO TBI  Result Date: 01/07/2020 LOWER EXTREMITY DOPPLER STUDY Indications: Ulceration.  Performing Technologist: Charlane Ferretti RT (R)(VS)  Examination Guidelines: A complete evaluation includes at minimum, Doppler waveform signals and systolic blood pressure reading at the level of bilateral brachial, anterior tibial, and posterior tibial arteries, when vessel segments are accessible. Bilateral testing is considered an integral part of a complete examination. Photoelectric Plethysmograph (PPG) waveforms and toe systolic pressure readings are included as required and additional duplex testing as needed. Limited examinations for reoccurring indications may be performed as noted.  ABI Findings: +---------+------------------+-----+---------+----------------+ Right    Rt Pressure (mmHg)IndexWaveform Comment          +---------+------------------+-----+---------+----------------+ Brachial 162                                              +---------+------------------+-----+---------+----------------+ ATA      196                1.10 triphasic                 +---------+------------------+-----+---------+----------------+ PTA  triphasicNon compressable +---------+------------------+-----+---------+----------------+ Great Toe                       Normal   Non compressable +---------+------------------+-----+---------+----------------+ +---------+------------------+-----+----------+-------+ Left     Lt Pressure (mmHg)IndexWaveform  Comment +---------+------------------+-----+----------+-------+ Brachial 178                                      +---------+------------------+-----+----------+-------+ ATA      184               1.03 monophasic        +---------+------------------+-----+----------+-------+ PTA      183               1.03 monophasic        +---------+------------------+-----+----------+-------+ Great Toe140               0.79 Normal            +---------+------------------+-----+----------+-------+ Summary: Right: Resting right ankle-brachial index is within normal range. No evidence of significant right lower extremity arterial disease. The right toe-brachial index is normal. TBIs are unreliable. Left: Resting left ankle-brachial index is within normal range. No evidence of significant left lower extremity arterial disease. The left toe-brachial index is normal. *See table(s) above for measurements and observations.  Electronically signed by Hortencia Pilar MD on 01/07/2020 at 3:53:03 PM.   Final    [place] ASCITES (ABDOMEN LIMITED)  Result Date: 01/29/2020 CLINICAL DATA:  Cirrhosis. EXAM: ULTRASOUND ABDOMEN LIMITED COMPARISON:  None. FINDINGS: All 4 quadrants of the abdomen were cysts by ultrasound. There is trace ascites within the RIGHT lower quadrant and LEFT lower quadrant. No additional ascites demonstrated. IMPRESSION: Trace ascites. Electronically Signed   By: Franki Cabot M.D.   On: 01/29/2020 08:23   US SCROTUM W/DOPPLER  Result Date:  01/28/2020 CLINICAL DATA:  Scrotal swelling EXAM: SCROTAL ULTRASOUND DOPPLER ULTRASOUND OF THE TESTICLES TECHNIQUE: Complete ultrasound examination of the testicles, epididymis, and other scrotal structures was performed. Color and spectral Doppler ultrasound were also utilized to evaluate blood flow to the testicles. COMPARISON:  09/27/2013 FINDINGS: Right testicle Measurements: 2.8 x 1.5 x 1.8 cm. The testicle is small similar to that seen on prior exam. 7 mm hypoechoic focus is noted centrally within testicle are decreased in size when compared with the prior exam. Left testicle Measurements: 4.0 x 2.4 x 2.1 cm. No mass or microlithiasis visualized. Right epididymis:  Normal in size and appearance. Left epididymis:  8 mm cyst is noted within the left epididymis. Hydrocele:  Large right hydrocele is noted. Varicocele:  None visualized. Pulsed Doppler interrogation of both testes demonstrates normal low resistance arterial and venous waveforms bilaterally. Diffuse scrotal edema is noted. IMPRESSION: The current scrotal swelling is related to diffuse scrotal edema. Chronic large right-sided hydrocele is again seen. Small hypoechoic focus within the right testicle which is decreased in size when compared with the prior exam from 2014 and likely of a benign etiology. Electronically Signed   By: Inez Catalina M.D.   On: 01/28/2020 20:50    Wt Readings from Last 3 Encounters:  01/29/20 (!) 145.3 kg  12/15/19 (!) 147.5 kg  11/29/19 (!) 143.3 kg    EKG: Atrial fibrillation with controlled ventricular response.  Physical Exam:  Blood pressure (!) 143/57, pulse 71, temperature 97.9 F (36.6 C), temperature source Oral, resp. rate 16, height 5\' 9"  (1.753 m), weight (!) 145.3 kg,  SpO2 99 %. Body mass index is 47.3 kg/m. General: Well developed, well nourished, in no acute distress. Head: Normocephalic, atraumatic, sclera non-icteric, no xanthomas, nares are without discharge.  Neck: Negative for carotid  bruits. JVD not elevated. Lungs: Clear bilaterally to auscultation without wheezes, rales, or rhonchi. Breathing is unlabored. Heart: Irregular regular rhythm. Abdomen: Soft, non-tender, non-distended with normoactive bowel sounds. No hepatomegaly. No rebound/guarding. No obvious abdominal masses. Msk:  Strength and tone appear normal for age. Extremities: 4+ edema in his lower extremities including scrotum. Neuro: Alert and oriented X 3. No facial asymmetry. No focal deficit. Moves all extremities spontaneously. Psych:  Responds to questions appropriately with a normal affect.     Assessment and Plan  69 year old male with history of what appears to be chronic atrial fibrillation, chronic lower extremity edema with HFpEF with EF greater than 50%.  Admitted with worsening peripheral edema and shortness of breath.  He has been placed on Lasix drip with approximately 3 L thus far.  He has had some clinical improvement with this.  Chest x-ray revealed probable mild edema.  BNP was mildly elevated.  Peripheral edema-likely multifactorial.  Continue with compression devices and diuresis.  HFpEF-as mentioned above, symptoms likely are diastolic in nature.  Would agree with diuresis with Lasix drip.  We will continue with hydralazine for blood pressure control at 25 mg daily.  Low-sodium diet is recommended.  Atrial fibrillation-continue with rate control and follow.  Discussed chronic anticoagulation.  Patient has esophageal varices which makes this fairly high risk.  At this point will defer.  CKD stage IV-appreciate nephrology input.  On Lasix drip.  .  Esophageal varices with portal hypertension gastropathy-comanagement.  Signed, Teodoro Spray MD 01/29/2020, 6:59 PM Pager: [phone]

## 2020-01-30 ENCOUNTER — Inpatient Hospital Stay: Payer: Medicare Other

## 2020-01-30 DIAGNOSIS — I5033 Acute on chronic diastolic (congestive) heart failure: Secondary | ICD-10-CM

## 2020-01-30 DIAGNOSIS — D696 Thrombocytopenia, unspecified: Secondary | ICD-10-CM

## 2020-01-30 LAB — COMPREHENSIVE METABOLIC PANEL
ALT: 20 U/L (ref 0–44)
AST: 22 U/L (ref 15–41)
Albumin: 3.2 g/dL — ABNORMAL LOW (ref 3.5–5.0)
Alkaline Phosphatase: 64 U/L (ref 38–126)
Anion gap: 8 (ref 5–15)
BUN: 41 mg/dL — ABNORMAL HIGH (ref 8–23)
CO2: 24 mmol/L (ref 22–32)
Calcium: 9.2 mg/dL (ref 8.9–10.3)
Chloride: 111 mmol/L (ref 98–111)
Creatinine, Ser: 2.57 mg/dL — ABNORMAL HIGH (ref 0.61–1.24)
GFR calc Af Amer: 29 mL/min — ABNORMAL LOW (ref >60)
GFR calc non Af Amer: 25 mL/min — ABNORMAL LOW (ref >60)
Glucose, Bld: 163 mg/dL — ABNORMAL HIGH (ref 70–99)
Potassium: 4.1 mmol/L (ref 3.5–5.1)
Sodium: 143 mmol/L (ref 135–145)
Total Bilirubin: 0.7 mg/dL (ref 0.3–1.2)
Total Protein: 6.5 g/dL (ref 6.5–8.1)

## 2020-01-30 LAB — MAGNESIUM: Magnesium: 2 mg/dL (ref 1.7–2.4)

## 2020-01-30 LAB — IRON AND TIBC
Iron: 37 ug/dL — ABNORMAL LOW (ref 45–182)
Saturation Ratios: 14 % — ABNORMAL LOW (ref 17.9–39.5)
TIBC: 270 ug/dL (ref 250–450)
UIBC: 233 ug/dL

## 2020-01-30 LAB — HEPATITIS B CORE ANTIBODY, IGM: Hep B C IgM: NONREACTIVE

## 2020-01-30 LAB — HEPATITIS B SURFACE ANTIGEN: Hepatitis B Surface Ag: NONREACTIVE

## 2020-01-30 LAB — GLUCOSE, CAPILLARY
Glucose-Capillary: 144 mg/dL — ABNORMAL HIGH (ref 70–99)
Glucose-Capillary: 187 mg/dL — ABNORMAL HIGH (ref 70–99)
Glucose-Capillary: 201 mg/dL — ABNORMAL HIGH (ref 70–99)
Glucose-Capillary: 220 mg/dL — ABNORMAL HIGH (ref 70–99)

## 2020-01-30 LAB — HEPATITIS B SURFACE ANTIBODY,QUALITATIVE: Hep B S Ab: NONREACTIVE

## 2020-01-30 LAB — FERRITIN: Ferritin: 21 ng/mL — ABNORMAL LOW (ref 24–336)

## 2020-01-30 LAB — VITAMIN B12: Vitamin B-12: 795 pg/mL (ref 180–914)

## 2020-01-30 LAB — HEPATITIS C ANTIBODY: HCV Ab: NONREACTIVE

## 2020-01-30 LAB — FOLATE: Folate: 16.4 ng/mL (ref >5.9)

## 2020-01-30 LAB — PHOSPHORUS: Phosphorus: 3.2 mg/dL (ref 2.5–4.6)

## 2020-01-30 NOTE — Progress Notes (Signed)
[facility], [place] 01/30/20  Subjective:   Hospital day # 1  Renal consult for volume overload and anasarca   continues to have large amount of LE edema and scrotal edema Having some difficulty with fluid restriction No SOB   Objective:  Vital signs in last 24 hours:  Temp:  [97.9 F (36.6 C)-99.3 F (37.4 C)] 98.6 F (37 C) (05/01 0455) Pulse Rate:  [71-93] 81 (05/01 0455) Resp:  [16-20] 20 (05/01 0455) BP: (143-155)/(57-69) 145/57 (05/01 0455) SpO2:  [99 %-100 %] 100 % (05/01 0455)  Weight change:  Filed Weights   01/28/20 1558 01/29/20 0443  Weight: (!) 140.6 kg (!) 145.3 kg    Intake/Output:    Intake/Output Summary (Last 24 hours) at 01/30/2020 0837 Last data filed at 01/30/2020 0814 Gross per 24 hour  Intake 365.07 ml  Output 1450 ml  Net -1084.93 ml     Physical Exam: General: NAD, laying in bed,   HEENT Anicteric   Pulm/lungs Coarse breath sounds, room air  CVS/Heart regular  Abdomen:  Soft, NT  Extremities: ++ edema b.l, ++ scrotal edema  Neurologic: Alert, oriented  Skin: Stasis dermatitis   Rectal tube   External urinary catheter    Basic Metabolic Panel:  Recent Labs  Lab 01/28/20 1605 01/29/20 0539 01/30/20 0629  NA 142 143 143  K 4.9 4.3 4.1  CL 113* 111 111  CO2 23 23 24   GLUCOSE 61* 124* 163*  BUN 42* 41* 41*  CREATININE 2.43* 2.46* 2.57*  CALCIUM 9.2 9.1 9.2  MG  --   --  2.0  PHOS  --   --  3.2     CBC: Recent Labs  Lab 01/28/20 1605 01/29/20 0539  WBC 4.7 4.3  HGB 7.8* 7.6*  HCT 25.6* 24.1*  MCV 94.8 92.7  PLT 122* 114*     No results found for: HEPBSAG, HEPBSAB, HEPBIGM    Microbiology:  Recent Results (from the past 240 hour(s))  Respiratory Panel by RT PCR (Flu A&B, Covid) - Nasopharyngeal Swab     Status: None   Collection Time: 01/28/20 10:34 PM   Specimen: Nasopharyngeal Swab  Result Value Ref Range Status   SARS Coronavirus 2 by RT PCR NEGATIVE NEGATIVE Final     Comment: (NOTE) SARS-CoV-2 target nucleic acids are NOT DETECTED. The SARS-CoV-2 RNA is generally detectable in upper respiratoy specimens during the acute phase of infection. The lowest concentration of SARS-CoV-2 viral copies this assay can detect is 131 copies/mL. A negative result does not preclude SARS-Cov-2 infection and should not be used as the sole basis for treatment or other patient management decisions. A negative result may occur with  improper specimen collection/handling, submission of specimen other than nasopharyngeal swab, presence of viral mutation(s) within the areas targeted by this assay, and inadequate number of viral copies (<131 copies/mL). A negative result must be combined with clinical observations, patient history, and epidemiological information. The expected result is Negative. Fact Sheet for Patients:  PinkCheek.be Fact Sheet for Healthcare Providers:  GravelBags.it This test is not yet ap proved or cleared by the [place] FDA and  has been authorized for detection and/or diagnosis of SARS-CoV-2 by FDA under an Emergency Use Authorization (EUA). This EUA will remain  in effect (meaning this test can be used) for the duration of the COVID-19 declaration under Section 564(b)(1) of the Act, 21 U.S.C. section 360bbb-3(b)(1), unless the authorization is terminated or revoked sooner.    Influenza A by  PCR NEGATIVE NEGATIVE Final   Influenza B by PCR NEGATIVE NEGATIVE Final    Comment: (NOTE) The Xpert Xpress SARS-CoV-2/FLU/RSV assay is intended as an aid in  the diagnosis of influenza from Nasopharyngeal swab specimens and  should not be used as a sole basis for treatment. Nasal washings and  aspirates are unacceptable for Xpert Xpress SARS-CoV-2/FLU/RSV  testing. Fact Sheet for Patients: PinkCheek.be Fact Sheet for Healthcare  Providers: GravelBags.it This test is not yet approved or cleared by the [place] FDA and  has been authorized for detection and/or diagnosis of SARS-CoV-2 by  FDA under an Emergency Use Authorization (EUA). This EUA will remain  in effect (meaning this test can be used) for the duration of the  Covid-19 declaration under Section 564(b)(1) of the Act, 21  U.S.C. section 360bbb-3(b)(1), unless the authorization is  terminated or revoked. Performed at [facility], [address] [place], [place] [address]     Coagulation Studies: Recent Labs    01/28/20 1922 01/29/20 0539  LABPROT 14.8 15.6*  INR 1.2 1.3*    Urinalysis: Recent Labs    01/28/20 1607  COLORURINE STRAW*  LABSPEC 1.006  PHURINE 5.0  GLUCOSEU NEGATIVE  HGBUR NEGATIVE  BILIRUBINUR NEGATIVE  KETONESUR NEGATIVE  PROTEINUR 30*  NITRITE NEGATIVE  LEUKOCYTESUR NEGATIVE      Imaging: CT Head Wo Contrast  Result Date: 01/28/2020 CLINICAL DATA:  Hallucinations. Possible urinary tract infection. EXAM: CT HEAD WITHOUT CONTRAST TECHNIQUE: Contiguous axial images were obtained from the base of the skull through the vertex without intravenous contrast. COMPARISON:  04/28/2019 FINDINGS: Brain: No evidence of acute infarction, hemorrhage, hydrocephalus, extra-axial collection or mass lesion/mass effect. Vascular: No hyperdense vessel or unexpected calcification. Skull: Normal. Negative for fracture or focal lesion. Sinuses/Orbits: No acute finding. Other: None. IMPRESSION: Negative exam. Electronically Signed   By: Nolon Nations M.D.   On: 01/28/2020 19:16   DG Chest Port 1 View  Result Date: 01/28/2020 CLINICAL DATA:  Intermittent shortness of breath, diabetes EXAM: PORTABLE CHEST 1 VIEW COMPARISON:  12/14/2019 FINDINGS: The heart size and mediastinal contours are stable. Pulmonary vascular congestion with mild interstitial prominence, similar to the prior study. No large  pleural fluid collection. Right costophrenic angle was not included on the film. No pneumothorax. The visualized skeletal structures are unremarkable. IMPRESSION: Pulmonary vascular congestion with mild interstitial prominence, similar to the prior study. Findings may reflect mild edema. Electronically Signed   By: Davina Poke D.O.   On: 01/28/2020 20:20   [place] ASCITES (ABDOMEN LIMITED)  Result Date: 01/29/2020 CLINICAL DATA:  Cirrhosis. EXAM: ULTRASOUND ABDOMEN LIMITED COMPARISON:  None. FINDINGS: All 4 quadrants of the abdomen were cysts by ultrasound. There is trace ascites within the RIGHT lower quadrant and LEFT lower quadrant. No additional ascites demonstrated. IMPRESSION: Trace ascites. Electronically Signed   By: Franki Cabot M.D.   On: 01/29/2020 08:23   US SCROTUM W/DOPPLER  Result Date: 01/28/2020 CLINICAL DATA:  Scrotal swelling EXAM: SCROTAL ULTRASOUND DOPPLER ULTRASOUND OF THE TESTICLES TECHNIQUE: Complete ultrasound examination of the testicles, epididymis, and other scrotal structures was performed. Color and spectral Doppler ultrasound were also utilized to evaluate blood flow to the testicles. COMPARISON:  09/27/2013 FINDINGS: Right testicle Measurements: 2.8 x 1.5 x 1.8 cm. The testicle is small similar to that seen on prior exam. 7 mm hypoechoic focus is noted centrally within testicle are decreased in size when compared with the prior exam. Left testicle Measurements: 4.0 x 2.4 x 2.1 cm. No mass or  microlithiasis visualized. Right epididymis:  Normal in size and appearance. Left epididymis:  8 mm cyst is noted within the left epididymis. Hydrocele:  Large right hydrocele is noted. Varicocele:  None visualized. Pulsed Doppler interrogation of both testes demonstrates normal low resistance arterial and venous waveforms bilaterally. Diffuse scrotal edema is noted. IMPRESSION: The current scrotal swelling is related to diffuse scrotal edema. Chronic large right-sided hydrocele is again  seen. Small hypoechoic focus within the right testicle which is decreased in size when compared with the prior exam from 2014 and likely of a benign etiology. Electronically Signed   By: Inez Catalina M.D.   On: 01/28/2020 20:50     Medications:   . albumin human Stopped (01/30/20 0086)  . furosemide (LASIX) infusion 8 mg/hr (01/30/20 7619)   . atorvastatin  20 mg Oral q1800  . enoxaparin (LOVENOX) injection  40 mg Subcutaneous Q12H  . hydrALAZINE  25 mg Oral TID  . insulin aspart  0-9 Units Subcutaneous TID WC  . lactulose  30 g Oral QID  . pantoprazole  40 mg Oral BID  . sodium chloride flush  3 mL Intravenous Once  . 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  Not currently on ACE-I/ARB at home.  Chronic kidney disease secondary to diabetes, hypertension.  Acute renal failure secondary to acute cardiorenal syndrome 04/30 0701 - 05/01 0700 In: 365.1 [I.V.:117.8; IV Piggyback:247.3] Out: 1450 [Urine:1250; Stool:200]  Lab Results  Component Value Date   CREATININE 2.57 (H) 01/30/2020   CREATININE 2.46 (H) 01/29/2020   CREATININE 2.43 (H) 01/28/2020      # Acute exacerbation of diastolic congestive heart failure with hypertension and new onset atrial flutter.    #Anemia of chronic kidney disease and history of GI bleed. Thrombocytopenia.  Lab Results  Component Value Date   HGB 7.6 (L) 01/29/2020    # Diabetes mellitus type II with  chronic kidney disease: insulin dependent. Lab Results  Component Value Date   HGBA1C 6.6 (H) 12/15/2019    Plan - continue furosemide gtt - currently at 8 mg/hr - spironolactone - HOLD potassium  - fluid restriction.  - IV albumin.  - we discussed possibility of dialysis if response to medical treatments is not adequate - will consider EPO once cleared by GI (small pancreas mass ?)     LOS: [place] 5/1/20218:37 AM  [place], [place]  Note: This note was prepared with Dragon dictation. Any transcription errors are unintentional

## 2020-01-30 NOTE — Progress Notes (Signed)
PROGRESS NOTE    Corey West  WEX:[id number] DOB: Jul 25, 1951 DOA: 01/28/2020 PCP: System, Pcp Not In    Brief Narrative:  Corey West is a 69 y.o. 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  Patient reports several days of progressive increase edema in bilateral lower extremities and scrotum.  He has been having associated shortness of breath.  He says he had visual hallucinations today but denies any confusion.  He has been taking lactulose with last bowel movement yesterday however states he did not take his lactulose today due to his significant scrotal discomfort.  He also notes that previously loosefitting pants are now tight on him.  He reports good urine output.  He denies any subjective fevers, chills, diaphoresis, chest pain.    Consultants:   Nephrology, cardiology  Procedures:   Antimicrobials:      Subjective: Has no new complaints feels the same way.  Stating that he is urinating and having bowel movement all  Objective: Vitals:   01/29/20 0443 01/29/20 1134 01/29/20 2050 01/30/20 0455  BP: (!) 130/51 (!) 143/57 (!) 155/69 (!) 145/57  Pulse: 82 71 93 81  Resp: 18 16 16 20   Temp: 97.6 F (36.4 C) 97.9 F (36.6 C) 99.3 F (37.4 C) 98.6 F (37 C)  TempSrc: Oral Oral Oral Oral  SpO2: 100% 99% 99% 100%  Weight: (!) 145.3 kg     Height:        Intake/Output Summary (Last 24 hours) at 01/30/2020 0820 Last data filed at 01/30/2020 9381 Gross per 24 hour  Intake 365.07 ml  Output 1450 ml  Net -1084.93 ml   Filed Weights   01/28/20 1558 01/29/20 0443  Weight: (!) 140.6 kg (!) 145.3 kg    Examination:  General exam: Appears calm and  comfortable, NAD, less puffy looking in the face Respiratory system: scattered rales at bases no wheeze Cardiovascular system: S1 & S2 heard, RRR. No JVD, murmurs, rubs, gallops or clicks. No . Gastrointestinal system: Abdomen is nondistended, soft and nontender.. Normal bowel sounds heard.  GU: scrotal edema about the same.  Central nervous system: Alert and oriented.  Extremities: +edema b/l  Skin: Warm dry Psychiatry:Mood & affect appropriate in current setting.     Data Reviewed: I have personally reviewed following labs and imaging studies  CBC: Recent Labs  Lab 01/28/20 1605 01/29/20 0539  WBC 4.7 4.3  HGB 7.8* 7.6*  HCT 25.6* 24.1*  MCV 94.8 92.7  PLT 122* 017*   Basic Metabolic Panel: Recent Labs  Lab 01/28/20 1605 01/29/20 0539 01/30/20 0629  NA 142 143 143  K 4.9 4.3 4.1  CL 113* 111 111  CO2 23 23 24   GLUCOSE 61* 124* 163*  BUN 42* 41* 41*  CREATININE 2.43* 2.46* 2.57*  CALCIUM 9.2 9.1 9.2  MG  --   --  2.0  PHOS  --   --  3.2   GFR: Estimated Creatinine Clearance: 39.6 mL/min (A) (by C-G formula based on SCr of 2.57 mg/dL (H)). Liver Function Tests: Recent Labs  Lab 01/28/20 1605 01/29/20 0539 01/30/20 0629  AST 35 31 22  ALT 29 24 20   ALKPHOS 89 82 64  BILITOT 0.9 0.8 0.7  PROT 7.3 6.5 6.5  ALBUMIN 3.0*  2.7* 3.2*   No results for input(s): LIPASE, AMYLASE in the last 168 hours. Recent Labs  Lab 01/28/20 1948  AMMONIA 70*   Coagulation Profile: Recent Labs  Lab 01/28/20 1922 01/29/20 0539  INR 1.2 1.3*   Cardiac Enzymes: No results for input(s): CKTOTAL, CKMB, CKMBINDEX, TROPONINI in the last 168 hours. BNP (last 3 results) No results for input(s): PROBNP in the last 8760 hours. HbA1C: No results for input(s): HGBA1C in the last 72 hours. CBG: Recent Labs  Lab 01/29/20 0741 01/29/20 1217 01/29/20 1624 01/29/20 2048 01/30/20 0746  GLUCAP 102* 140* 175* 196* 144*   Lipid Profile: No results for input(s): CHOL, HDL,  LDLCALC, TRIG, CHOLHDL, LDLDIRECT in the last 72 hours. Thyroid Function Tests: No results for input(s): TSH, T4TOTAL, FREET4, T3FREE, THYROIDAB in the last 72 hours. Anemia Panel: Recent Labs    01/30/20 0629  FOLATE 16.4  FERRITIN 21*  TIBC 270  IRON 37*   Sepsis Labs: No results for input(s): PROCALCITON, LATICACIDVEN in the last 168 hours.  Recent Results (from the past 240 hour(s))  Respiratory Panel by RT PCR (Flu A&B, Covid) - Nasopharyngeal Swab     Status: None   Collection Time: 01/28/20 10:34 PM   Specimen: Nasopharyngeal Swab  Result Value Ref Range Status   SARS Coronavirus 2 by RT PCR NEGATIVE NEGATIVE Final    Comment: (NOTE) SARS-CoV-2 target nucleic acids are NOT DETECTED. The SARS-CoV-2 RNA is generally detectable in upper respiratoy specimens during the acute phase of infection. The lowest concentration of SARS-CoV-2 viral copies this assay can detect is 131 copies/mL. A negative result does not preclude SARS-Cov-2 infection and should not be used as the sole basis for treatment or other patient management decisions. A negative result may occur with  improper specimen collection/handling, submission of specimen other than nasopharyngeal swab, presence of viral mutation(s) within the areas targeted by this assay, and inadequate number of viral copies (<131 copies/mL). A negative result must be combined with clinical observations, patient history, and epidemiological information. The expected result is Negative. Fact Sheet for Patients:  PinkCheek.be Fact Sheet for Healthcare Providers:  GravelBags.it This test is not yet ap proved or cleared by the [place] FDA and  has been authorized for detection and/or diagnosis of SARS-CoV-2 by FDA under an Emergency Use Authorization (EUA). This EUA will remain  in effect (meaning this test can be used) for the duration of the COVID-19 declaration under  Section 564(b)(1) of the Act, 21 U.S.C. section 360bbb-3(b)(1), unless the authorization is terminated or revoked sooner.    Influenza A by PCR NEGATIVE NEGATIVE Final   Influenza B by PCR NEGATIVE NEGATIVE Final    Comment: (NOTE) The Xpert Xpress SARS-CoV-2/FLU/RSV assay is intended as an aid in  the diagnosis of influenza from Nasopharyngeal swab specimens and  should not be used as a sole basis for treatment. Nasal washings and  aspirates are unacceptable for Xpert Xpress SARS-CoV-2/FLU/RSV  testing. Fact Sheet for Patients: PinkCheek.be Fact Sheet for Healthcare Providers: GravelBags.it This test is not yet approved or cleared by the [place] FDA and  has been authorized for detection and/or diagnosis of SARS-CoV-2 by  FDA under an Emergency Use Authorization (EUA). This EUA will remain  in effect (meaning this test can be used) for the duration of the  Covid-19 declaration under Section 564(b)(1) of the Act, 21  U.S.C. section 360bbb-3(b)(1), unless the authorization is  terminated or revoked. Performed at [facility], [address],  [place], [place] [address]          Radiology Studies: CT Head Wo Contrast  Result Date: 01/28/2020 CLINICAL DATA:  Hallucinations. Possible urinary tract infection. EXAM: CT HEAD WITHOUT CONTRAST TECHNIQUE: Contiguous axial images were obtained from the base of the skull through the vertex without intravenous contrast. COMPARISON:  04/28/2019 FINDINGS: Brain: No evidence of acute infarction, hemorrhage, hydrocephalus, extra-axial collection or mass lesion/mass effect. Vascular: No hyperdense vessel or unexpected calcification. Skull: Normal. Negative for fracture or focal lesion. Sinuses/Orbits: No acute finding. Other: None. IMPRESSION: Negative exam. Electronically Signed   By: Nolon Nations M.D.   On: 01/28/2020 19:16   DG Chest Port 1 View  Result Date:  01/28/2020 CLINICAL DATA:  Intermittent shortness of breath, diabetes EXAM: PORTABLE CHEST 1 VIEW COMPARISON:  12/14/2019 FINDINGS: The heart size and mediastinal contours are stable. Pulmonary vascular congestion with mild interstitial prominence, similar to the prior study. No large pleural fluid collection. Right costophrenic angle was not included on the film. No pneumothorax. The visualized skeletal structures are unremarkable. IMPRESSION: Pulmonary vascular congestion with mild interstitial prominence, similar to the prior study. Findings may reflect mild edema. Electronically Signed   By: Davina Poke D.O.   On: 01/28/2020 20:20   [place] ASCITES (ABDOMEN LIMITED)  Result Date: 01/29/2020 CLINICAL DATA:  Cirrhosis. EXAM: ULTRASOUND ABDOMEN LIMITED COMPARISON:  None. FINDINGS: All 4 quadrants of the abdomen were cysts by ultrasound. There is trace ascites within the RIGHT lower quadrant and LEFT lower quadrant. No additional ascites demonstrated. IMPRESSION: Trace ascites. Electronically Signed   By: Franki Cabot M.D.   On: 01/29/2020 08:23   US SCROTUM W/DOPPLER  Result Date: 01/28/2020 CLINICAL DATA:  Scrotal swelling EXAM: SCROTAL ULTRASOUND DOPPLER ULTRASOUND OF THE TESTICLES TECHNIQUE: Complete ultrasound examination of the testicles, epididymis, and other scrotal structures was performed. Color and spectral Doppler ultrasound were also utilized to evaluate blood flow to the testicles. COMPARISON:  09/27/2013 FINDINGS: Right testicle Measurements: 2.8 x 1.5 x 1.8 cm. The testicle is small similar to that seen on prior exam. 7 mm hypoechoic focus is noted centrally within testicle are decreased in size when compared with the prior exam. Left testicle Measurements: 4.0 x 2.4 x 2.1 cm. No mass or microlithiasis visualized. Right epididymis:  Normal in size and appearance. Left epididymis:  8 mm cyst is noted within the left epididymis. Hydrocele:  Large right hydrocele is noted. Varicocele:  None  visualized. Pulsed Doppler interrogation of both testes demonstrates normal low resistance arterial and venous waveforms bilaterally. Diffuse scrotal edema is noted. IMPRESSION: The current scrotal swelling is related to diffuse scrotal edema. Chronic large right-sided hydrocele is again seen. Small hypoechoic focus within the right testicle which is decreased in size when compared with the prior exam from 2014 and likely of a benign etiology. Electronically Signed   By: Inez Catalina M.D.   On: 01/28/2020 20:50        Scheduled Meds: . atorvastatin  20 mg Oral q1800  . enoxaparin (LOVENOX) injection  40 mg Subcutaneous Q12H  . hydrALAZINE  25 mg Oral TID  . insulin aspart  0-9 Units Subcutaneous TID WC  . lactulose  30 g Oral QID  . pantoprazole  40 mg Oral BID  . sodium chloride flush  3 mL Intravenous Once  . spironolactone  25 mg Oral Daily   Continuous Infusions: . albumin human Stopped (01/30/20 0621)  . furosemide (LASIX) infusion 8 mg/hr (01/30/20 7654)    Assessment & Plan:  Principal Problem:   Volume overload Active Problems:   Cirrhosis (HCC)   Thrombocytopenia (HCC)   Chronic venous stasis dermatitis of both lower extremities   Hypertension associated with diabetes ([place])   Type 2 diabetes mellitus with diabetic nephropathy, with long-term current use of insulin (HCC)   CKD (chronic kidney disease) stage 4, GFR 15-29 ml/min (HCC)   Hyperlipidemia associated with type 2 diabetes mellitus (HCC)  Acute on chronic DHF- very volume overloaded. BNP elevated, but likely underestimated due to his weight. Cards following Continue with lasix gtt Monitor labs    Hepatic cirrhosis with anasarca and visual hallucinations: Patient with significant volume overload in setting of cirrhosis, anasarca, acute on chronic diastolic heart failure.  Ultrasound scrotum shows significant scrotal edema with chronic right hydrocele.  He reports recent visual hallucinations but is otherwise  alert and oriented at time of admission.  Ammonia is 70. -Continue Lasix drip -Monitor strict I/O's and daily weights -Continue lactulose, titrate to 2-3 bowel movements per day -Abdominal ultrasound with trace ascites -TTE on 12/16/2019 showed EF 60-65% with G1DD  CKD stage IV: If Nephrology input was appreciated, started on furosemide drip and spironolactone.  Hold potassium.  Fluid restriction.  IV albumin. If he does not respond to medical treatment then dialysis may be more appropriate Per nephrology will consider EPO once cleared by GI (small pancreas mass ?)   Insulin-dependent type 2 diabetes: Hypoglycemic on ED arrival.    Held home NovoLog 70/30 and place on sensitive SSI for now.  Adjust as needed.  Anemia/thrombocytopenia: Chronic and stable without obvious bleeding in setting of cirrhosis and iron deficiency.  Continue to monitor.  Atrial fibrillation-continue with rate control and follow. Cardiology following, havediscussed chronic anticoagulation with the patient. Patient has esophageal varices which makes this fairly high risk. At this point will defer chronic anticoagulation due to his varices..   Hypertension: Continue home hydralazine and IV Lasix as above.  Chronic venous stasis and edema of both lower extremities: Without obvious superimposed cellulitis.    Hyperlipidemia: Continue atorvastatin.  Anxiety: Continue as needed Ativan.  DVT prophylaxis: Lovenox Code Status: Full code, confirmed with patient Family Communication: Discussed with patient, he has discussed with family Disposition Plan: From home, likely discharge to home pending diuresis and symptomatic improvement [place] very volume overloaded with anasarca and acute on diastolic heart failure needing Lasix drip.       LOS: 1 day   Time spent: 45 minutes with more than 50% on [place], MD [facility] Pager [phone]  If 7PM-7AM, please contact  night-coverage [email or web address] Password [email or web address] 01/30/2020, 8:20 AM Patient ID: Corey West, male   DOB: Feb 24, 1951, 69 y.o.   MRN: [id number]

## 2020-01-30 NOTE — Progress Notes (Signed)
Stool leaking around fecal tube. I attempted to readjust and was unable. When taking tube out, I had to remove about 120 ml water from securing device. Patient stated pain relief with removal. Replaced new tube with 45 ml water in securing device.

## 2020-01-30 NOTE — Progress Notes (Signed)
Patient Name: Corey West Date of Encounter: 01/30/2020  Hospital Problem List     Principal Problem:   Volume overload Active Problems:   Cirrhosis (HCC)   Thrombocytopenia (HCC)   Chronic venous stasis dermatitis of both lower extremities   Hypertension associated with diabetes ([place])   Type 2 diabetes mellitus with diabetic nephropathy, with long-term current use of insulin (HCC)   CKD (chronic kidney disease) stage 4, GFR 15-29 ml/min (HCC)   Hyperlipidemia associated with type 2 diabetes mellitus [facility])    Patient Profile      69 y.o. male with history of hepatic cirrhosis, portal hypertension, dm, ckd iv, hypertension, hyperlipidemia with chronic lower extremety edema who was admitted with progressive sob and lower extremety edema. CXR showed possible mild pulmonary edema  Subjective   Continues to diuresis.  Will need to follow renal function.  Is -2.8 L.  Inpatient Medications    . atorvastatin  20 mg Oral q1800  . enoxaparin (LOVENOX) injection  40 mg Subcutaneous Q12H  . hydrALAZINE  25 mg Oral TID  . insulin aspart  0-9 Units Subcutaneous TID WC  . lactulose  30 g Oral QID  . pantoprazole  40 mg Oral BID  . sodium chloride flush  3 mL Intravenous Once  . spironolactone  25 mg Oral Daily    Vital Signs    Vitals:   01/29/20 0443 01/29/20 1134 01/29/20 2050 01/30/20 0455  BP: (!) 130/51 (!) 143/57 (!) 155/69 (!) 145/57  Pulse: 82 71 93 81  Resp: 18 16 16 20   Temp: 97.6 F (36.4 C) 97.9 F (36.6 C) 99.3 F (37.4 C) 98.6 F (37 C)  TempSrc: Oral Oral Oral Oral  SpO2: 100% 99% 99% 100%  Weight: (!) 145.3 kg     Height:        Intake/Output Summary (Last 24 hours) at 01/30/2020 0902 Last data filed at 01/30/2020 3846 Gross per 24 hour  Intake 365.07 ml  Output 1450 ml  Net -1084.93 ml   Filed Weights   01/28/20 1558 01/29/20 0443  Weight: (!) 140.6 kg (!) 145.3 kg    Physical Exam    GEN: Well nourished, well developed, in no acute distress.   HEENT: normal.  Neck: Supple, no JVD, carotid bruits, or masses. Cardiac: RRR, no murmurs, rubs, or gallops. No clubbing, cyanosis, edema.  Radials/DP/PT 2+ and equal bilaterally.  Respiratory:  Respirations regular and unlabored, clear to auscultation bilaterally. GI: Soft, nontender, nondistended, BS + x 4. MS: no deformity or atrophy. Skin: warm and dry, no rash. Neuro:  Strength and sensation are intact. Psych: Normal affect.  Labs    CBC Recent Labs    01/28/20 1605 01/29/20 0539  WBC 4.7 4.3  HGB 7.8* 7.6*  HCT 25.6* 24.1*  MCV 94.8 92.7  PLT 122* 659*   Basic Metabolic Panel Recent Labs    01/29/20 0539 01/30/20 0629  NA 143 143  K 4.3 4.1  CL 111 111  CO2 23 24  GLUCOSE 124* 163*  BUN 41* 41*  CREATININE 2.46* 2.57*  CALCIUM 9.1 9.2  MG  --  2.0  PHOS  --  3.2   Liver Function Tests Recent Labs    01/29/20 0539 01/30/20 0629  AST 31 22  ALT 24 20  ALKPHOS 82 64  BILITOT 0.8 0.7  PROT 6.5 6.5  ALBUMIN 2.7* 3.2*   No results for input(s): LIPASE, AMYLASE in the last 72 hours. Cardiac Enzymes No results for  input(s): CKTOTAL, CKMB, CKMBINDEX, TROPONINI in the last 72 hours. BNP Recent Labs    01/28/20 1605 01/29/20 0539  BNP 217.0* 246.0*   D-Dimer No results for input(s): DDIMER in the last 72 hours. Hemoglobin A1C No results for input(s): HGBA1C in the last 72 hours. Fasting Lipid Panel No results for input(s): CHOL, HDL, LDLCALC, TRIG, CHOLHDL, LDLDIRECT in the last 72 hours. Thyroid Function Tests No results for input(s): TSH, T4TOTAL, T3FREE, THYROIDAB in the last 72 hours.  Invalid input(s): FREET3  Telemetry    Atrial fibrillation with controlled ventricular response  ECG    Atrial fibrillation with controlled ventricular response  Radiology    CT Head Wo Contrast  Result Date: 01/28/2020 CLINICAL DATA:  Hallucinations. Possible urinary tract infection. EXAM: CT HEAD WITHOUT CONTRAST TECHNIQUE: Contiguous axial images  were obtained from the base of the skull through the vertex without intravenous contrast. COMPARISON:  04/28/2019 FINDINGS: Brain: No evidence of acute infarction, hemorrhage, hydrocephalus, extra-axial collection or mass lesion/mass effect. Vascular: No hyperdense vessel or unexpected calcification. Skull: Normal. Negative for fracture or focal lesion. Sinuses/Orbits: No acute finding. Other: None. IMPRESSION: Negative exam. Electronically Signed   By: Nolon Nations M.D.   On: 01/28/2020 19:16   DG Chest Port 1 View  Result Date: 01/28/2020 CLINICAL DATA:  Intermittent shortness of breath, diabetes EXAM: PORTABLE CHEST 1 VIEW COMPARISON:  12/14/2019 FINDINGS: The heart size and mediastinal contours are stable. Pulmonary vascular congestion with mild interstitial prominence, similar to the prior study. No large pleural fluid collection. Right costophrenic angle was not included on the film. No pneumothorax. The visualized skeletal structures are unremarkable. IMPRESSION: Pulmonary vascular congestion with mild interstitial prominence, similar to the prior study. Findings may reflect mild edema. Electronically Signed   By: Davina Poke D.O.   On: 01/28/2020 20:20   VAS [place] ABI WITH/WO TBI  Result Date: 01/07/2020 LOWER EXTREMITY DOPPLER STUDY Indications: Ulceration.  Performing Technologist: Charlane Ferretti RT (R)(VS)  Examination Guidelines: A complete evaluation includes at minimum, Doppler waveform signals and systolic blood pressure reading at the level of bilateral brachial, anterior tibial, and posterior tibial arteries, when vessel segments are accessible. Bilateral testing is considered an integral part of a complete examination. Photoelectric Plethysmograph (PPG) waveforms and toe systolic pressure readings are included as required and additional duplex testing as needed. Limited examinations for reoccurring indications may be performed as noted.  ABI Findings:  +---------+------------------+-----+---------+----------------+ Right    Rt Pressure (mmHg)IndexWaveform Comment          +---------+------------------+-----+---------+----------------+ Brachial 162                                              +---------+------------------+-----+---------+----------------+ ATA      196               1.10 triphasic                 +---------+------------------+-----+---------+----------------+ PTA                             triphasicNon compressable +---------+------------------+-----+---------+----------------+ Great Toe                       Normal   Non compressable +---------+------------------+-----+---------+----------------+ +---------+------------------+-----+----------+-------+ Left     Lt Pressure (mmHg)IndexWaveform  Comment +---------+------------------+-----+----------+-------+ Brachial 178                                      +---------+------------------+-----+----------+-------+  ATA      184               1.03 monophasic        +---------+------------------+-----+----------+-------+ PTA      183               1.03 monophasic        +---------+------------------+-----+----------+-------+ Great Toe140               0.79 Normal            +---------+------------------+-----+----------+-------+ Summary: Right: Resting right ankle-brachial index is within normal range. No evidence of significant right lower extremity arterial disease. The right toe-brachial index is normal. TBIs are unreliable. Left: Resting left ankle-brachial index is within normal range. No evidence of significant left lower extremity arterial disease. The left toe-brachial index is normal. *See table(s) above for measurements and observations.  Electronically signed by Hortencia Pilar MD on 01/07/2020 at 3:53:03 PM.   Final    [place] ASCITES (ABDOMEN LIMITED)  Result Date: 01/29/2020 CLINICAL DATA:  Cirrhosis. EXAM: ULTRASOUND ABDOMEN LIMITED  COMPARISON:  None. FINDINGS: All 4 quadrants of the abdomen were cysts by ultrasound. There is trace ascites within the RIGHT lower quadrant and LEFT lower quadrant. No additional ascites demonstrated. IMPRESSION: Trace ascites. Electronically Signed   By: Franki Cabot M.D.   On: 01/29/2020 08:23   US SCROTUM W/DOPPLER  Result Date: 01/28/2020 CLINICAL DATA:  Scrotal swelling EXAM: SCROTAL ULTRASOUND DOPPLER ULTRASOUND OF THE TESTICLES TECHNIQUE: Complete ultrasound examination of the testicles, epididymis, and other scrotal structures was performed. Color and spectral Doppler ultrasound were also utilized to evaluate blood flow to the testicles. COMPARISON:  09/27/2013 FINDINGS: Right testicle Measurements: 2.8 x 1.5 x 1.8 cm. The testicle is small similar to that seen on prior exam. 7 mm hypoechoic focus is noted centrally within testicle are decreased in size when compared with the prior exam. Left testicle Measurements: 4.0 x 2.4 x 2.1 cm. No mass or microlithiasis visualized. Right epididymis:  Normal in size and appearance. Left epididymis:  8 mm cyst is noted within the left epididymis. Hydrocele:  Large right hydrocele is noted. Varicocele:  None visualized. Pulsed Doppler interrogation of both testes demonstrates normal low resistance arterial and venous waveforms bilaterally. Diffuse scrotal edema is noted. IMPRESSION: The current scrotal swelling is related to diffuse scrotal edema. Chronic large right-sided hydrocele is again seen. Small hypoechoic focus within the right testicle which is decreased in size when compared with the prior exam from 2014 and likely of a benign etiology. Electronically Signed   By: Inez Catalina M.D.   On: 01/28/2020 20:50    Assessment & Plan    69 year old male with history of what appears to be chronic atrial fibrillation, chronic lower extremity edema with HFpEF with EF greater than 50%.  Admitted with worsening peripheral edema and shortness of breath.  He has  been placed on Lasix drip with approximately 3 L thus far.  He has had some clinical improvement with this.  Chest x-ray revealed probable mild edema.  BNP was mildly elevated.  Peripheral edema-likely multifactorial.  Continue with compression devices and diuresis.  Continues to slowly diurese.  Renal function will need to be closely followed.HFpEF-as mentioned above, symptoms likely are diastolic in nature.  Would agree with diuresis with Lasix drip.  We will continue with hydralazine for blood pressure control at 25 mg daily.  Low-sodium diet is recommended.  Atrial fibrillation-continue with rate control and follow.  Discussed chronic  anticoagulation.  Patient has esophageal varices which makes this fairly high risk.  At this point will defer chronic anticoagulation due to his varices..  CKD stage IV-appreciate nephrology input.  On Lasix drip.  Creatinine 2.57 from 2.43  2 days ago.  Esophageal varices with portal hypertension gastropathy-comanagement.  Signed, Javier Docker Cozy Veale MD 01/30/2020, 9:02 AM  Pager: [phone]

## 2020-01-31 DIAGNOSIS — Z794 Long term (current) use of insulin: Secondary | ICD-10-CM

## 2020-01-31 DIAGNOSIS — E1121 Type 2 diabetes mellitus with diabetic nephropathy: Secondary | ICD-10-CM

## 2020-01-31 DIAGNOSIS — E785 Hyperlipidemia, unspecified: Secondary | ICD-10-CM

## 2020-01-31 DIAGNOSIS — E1169 Type 2 diabetes mellitus with other specified complication: Secondary | ICD-10-CM

## 2020-01-31 LAB — GLUCOSE, CAPILLARY
Glucose-Capillary: 139 mg/dL — ABNORMAL HIGH (ref 70–99)
Glucose-Capillary: 184 mg/dL — ABNORMAL HIGH (ref 70–99)
Glucose-Capillary: 235 mg/dL — ABNORMAL HIGH (ref 70–99)
Glucose-Capillary: 185 mg/dL — ABNORMAL HIGH (ref 70–99)

## 2020-01-31 LAB — BASIC METABOLIC PANEL
Anion gap: 7 (ref 5–15)
BUN: 38 mg/dL — ABNORMAL HIGH (ref 8–23)
CO2: 24 mmol/L (ref 22–32)
Calcium: 9.3 mg/dL (ref 8.9–10.3)
Chloride: 112 mmol/L — ABNORMAL HIGH (ref 98–111)
Creatinine, Ser: 2.51 mg/dL — ABNORMAL HIGH (ref 0.61–1.24)
GFR calc Af Amer: 30 mL/min — ABNORMAL LOW (ref >60)
GFR calc non Af Amer: 25 mL/min — ABNORMAL LOW (ref >60)
Glucose, Bld: 169 mg/dL — ABNORMAL HIGH (ref 70–99)
Potassium: 3.4 mmol/L — ABNORMAL LOW (ref 3.5–5.1)
Sodium: 143 mmol/L (ref 135–145)

## 2020-01-31 MED ORDER — POTASSIUM CHLORIDE CRYS ER 20 MEQ PO TBCR
40.0000 meq | EXTENDED_RELEASE_TABLET | Freq: Once | ORAL | Status: AC
  Administered 2020-01-31: 09:00:00 40 meq via ORAL
  Filled 2020-01-31: qty 2

## 2020-01-31 MED ORDER — LACTULOSE 10 GM/15ML PO SOLN
30.0000 g | Freq: Every day | ORAL | Status: DC
  Administered 2020-02-01 – 2020-02-02 (×2): 30 g via ORAL
  Filled 2020-01-31 (×2): qty 60

## 2020-01-31 NOTE — Progress Notes (Signed)
Leaking around rectal tube noted. Nursing has also noted leaking around external cath. External cath replaced. Patient cleaned up and new chucks placed. External bag changed at this time. Patient tolerated well. Safety checks completed and call light placed within reach.

## 2020-01-31 NOTE — Progress Notes (Signed)
[facility], [place] 01/31/20  Subjective:   Hospital day # 2  Renal consult for volume overload and anasarca  continues to have large amount of LE edema and scrotal edema but feels like that it is improving with IV Lasix Still having loose stools from lactulose No SOB   Objective:  Vital signs in last 24 hours:  Temp:  [98.3 F (36.8 C)-98.5 F (36.9 C)] 98.3 F (36.8 C) (05/02 0056) Pulse Rate:  [77-78] 78 (05/02 0056) Resp:  [15-20] 20 (05/02 0056) BP: (141-144)/(50-65) 141/50 (05/02 0056) SpO2:  [98 %-100 %] 98 % (05/02 0056) Weight:  [142.8 kg] 142.8 kg (05/02 0500)  Weight change:  Filed Weights   01/28/20 1558 01/29/20 0443 01/31/20 0500  Weight: (!) 140.6 kg (!) 145.3 kg (!) 142.8 kg    Intake/Output:    Intake/Output Summary (Last 24 hours) at 01/31/2020 0935 Last data filed at 01/31/2020 0537 Gross per 24 hour  Intake 396.2 ml  Output 1900 ml  Net -1503.8 ml     Physical Exam: General: NAD, laying in bed,   HEENT Anicteric   Pulm/lungs Coarse breath sounds, room air  CVS/Heart regular  Abdomen:  Soft, NT  Extremities: ++ edema b.l, ++ scrotal edema  Neurologic: Alert, oriented  Skin: Stasis dermatitis   Rectal tube   External urinary catheter    Basic Metabolic Panel:  Recent Labs  Lab 01/28/20 1605 01/28/20 1605 01/29/20 0539 01/30/20 0629 01/31/20 0628  NA 142  --  143 143 143  K 4.9  --  4.3 4.1 3.4*  CL 113*  --  111 111 112*  CO2 23  --  23 24 24   GLUCOSE 61*  --  124* 163* 169*  BUN 42*  --  41* 41* 38*  CREATININE 2.43*  --  2.46* 2.57* 2.51*  CALCIUM 9.2   < > 9.1 9.2 9.3  MG  --   --   --  2.0  --   PHOS  --   --   --  3.2  --    < > = values in this interval not displayed.     CBC: Recent Labs  Lab 01/28/20 1605 01/29/20 0539  WBC 4.7 4.3  HGB 7.8* 7.6*  HCT 25.6* 24.1*  MCV 94.8 92.7  PLT 122* 114*      Lab Results  Component Value Date   HEPBSAG NON REACTIVE 01/30/2020   HEPBSAB NON REACTIVE 01/30/2020   HEPBIGM NON REACTIVE 01/30/2020      Microbiology:  Recent Results (from the past 240 hour(s))  Respiratory Panel by RT PCR (Flu A&B, Covid) - Nasopharyngeal Swab     Status: None   Collection Time: 01/28/20 10:34 PM   Specimen: Nasopharyngeal Swab  Result Value Ref Range Status   SARS Coronavirus 2 by RT PCR NEGATIVE NEGATIVE Final    Comment: (NOTE) SARS-CoV-2 target nucleic acids are NOT DETECTED. The SARS-CoV-2 RNA is generally detectable in upper respiratoy specimens during the acute phase of infection. The lowest concentration of SARS-CoV-2 viral copies this assay can detect is 131 copies/mL. A negative result does not preclude SARS-Cov-2 infection and should not be used as the sole basis for treatment or other patient management decisions. A negative result may occur with  improper specimen collection/handling, submission of specimen other than nasopharyngeal swab, presence of viral mutation(s) within the areas targeted by this assay, and inadequate number of viral copies (<131 copies/mL). A negative result must be combined with clinical  observations, patient history, and epidemiological information. The expected result is Negative. Fact Sheet for Patients:  PinkCheek.be Fact Sheet for Healthcare Providers:  GravelBags.it This test is not yet ap proved or cleared by the [place] FDA and  has been authorized for detection and/or diagnosis of SARS-CoV-2 by FDA under an Emergency Use Authorization (EUA). This EUA will remain  in effect (meaning this test can be used) for the duration of the COVID-19 declaration under Section 564(b)(1) of the Act, 21 U.S.C. section 360bbb-3(b)(1), unless the authorization is terminated or revoked sooner.    Influenza A by PCR NEGATIVE NEGATIVE Final   Influenza B by PCR NEGATIVE NEGATIVE Final    Comment: (NOTE) The Xpert Xpress  SARS-CoV-2/FLU/RSV assay is intended as an aid in  the diagnosis of influenza from Nasopharyngeal swab specimens and  should not be used as a sole basis for treatment. Nasal washings and  aspirates are unacceptable for Xpert Xpress SARS-CoV-2/FLU/RSV  testing. Fact Sheet for Patients: PinkCheek.be Fact Sheet for Healthcare Providers: GravelBags.it This test is not yet approved or cleared by the [place] FDA and  has been authorized for detection and/or diagnosis of SARS-CoV-2 by  FDA under an Emergency Use Authorization (EUA). This EUA will remain  in effect (meaning this test can be used) for the duration of the  Covid-19 declaration under Section 564(b)(1) of the Act, 21  U.S.C. section 360bbb-3(b)(1), unless the authorization is  terminated or revoked. Performed at [facility], [address] [place], [place] [address]     Coagulation Studies: Recent Labs    01/28/20 1922 01/29/20 0539  LABPROT 14.8 15.6*  INR 1.2 1.3*    Urinalysis: Recent Labs    01/28/20 1607  COLORURINE STRAW*  LABSPEC 1.006  PHURINE 5.0  GLUCOSEU NEGATIVE  HGBUR NEGATIVE  BILIRUBINUR NEGATIVE  KETONESUR NEGATIVE  PROTEINUR 30*  NITRITE NEGATIVE  LEUKOCYTESUR NEGATIVE      Imaging: US RENAL  Result Date: 01/30/2020 CLINICAL DATA:  Acute renal failure. Chronic kidney disease stage 4. EXAM: RENAL / URINARY TRACT ULTRASOUND COMPLETE COMPARISON:  None. FINDINGS: Limited exam due to patient body habitus and overlying bowel gas. Right Kidney: Renal measurements: 10.2 x 4.8 x 5.7 cm = volume: 146 mL . Echogenicity within normal limits. No mass or hydronephrosis visualized. Left Kidney: Renal measurements: 11.9 x 5.4 x 6.8 cm = volume: 233 mL. Echogenicity within normal limits. No mass or hydronephrosis visualized. Bladder: Appears normal for degree of bladder distention. Prevoid volume 511 mL. Other: None. IMPRESSION: Limited  exam demonstrating normal size kidneys without hydronephrosis. Electronically Signed   By: Marin Olp M.D.   On: 01/30/2020 15:58     Medications:   . albumin human 25 g (01/31/20 0502)  . furosemide (LASIX) infusion 8 mg/hr (01/31/20 0537)   . atorvastatin  20 mg Oral q1800  . enoxaparin (LOVENOX) injection  40 mg Subcutaneous Q12H  . hydrALAZINE  25 mg Oral TID  . insulin aspart  0-9 Units Subcutaneous TID WC  . lactulose  30 g Oral QID  . pantoprazole  40 mg Oral BID  . sodium chloride flush  3 mL Intravenous Once  . 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  Not currently on ACE-I/ARB at home.  Chronic kidney disease secondary to diabetes, hypertension.  Acute renal failure secondary to acute cardiorenal syndrome 05/01 0701 - 05/02 0700 In: 396.2 [I.V.:185.4; IV Piggyback:210.8] Out: 1900 [Urine:400; Stool:1500]  Lab Results  Component Value Date   CREATININE 2.51 (H) 01/31/2020   CREATININE 2.57 (H) 01/30/2020   CREATININE 2.46 (H) 01/29/2020   Continue supportive care   #Cirrhosis with ascites and volume overload   #Anemia of chronic kidney disease and history of GI bleed. Thrombocytopenia.  Lab Results  Component Value Date   HGB 7.6 (L) 01/29/2020    # Diabetes mellitus type II with chronic kidney disease: insulin dependent. Lab Results  Component Value Date   HGBA1C 6.6 (H) 12/15/2019    Plan - continue  furosemide gtt - currently at 8 mg/hr -Continue spironolactone - HOLD potassium  - fluid restriction.  - IV albumin.  - we discussed possibility of dialysis if response to medical treatments is not adequate - will consider EPO once cleared by GI (small pancreas mass ?)  1.9 x 1.3 cm cyst noted in pancreatic tail during MRI MRCP 11/11/2019     LOS: [place] 5/2/20219:35 AM  [place], [place]  Note: This note was prepared with Dragon dictation. Any transcription errors are unintentional

## 2020-01-31 NOTE — Progress Notes (Signed)
PROGRESS NOTE    [name]  IPJ:[id number] DOB: 03-26-1951 DOA: 01/28/2020 PCP: System, Pcp Not In    Brief Narrative:  Corey West is a 69 y.o. 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  Patient reports several days of progressive increase edema in bilateral lower extremities and scrotum.  He has been having associated shortness of breath.  He says he had visual hallucinations today but denies any confusion.  He has been taking lactulose with last bowel movement yesterday however states he did not take his lactulose today due to his significant scrotal discomfort.  He also notes that previously loosefitting pants are now tight on him.  He reports good urine output.  He denies any subjective fevers, chills, diaphoresis, chest pain.    Consultants:   Nephrology, cardiology  Procedures:   Antimicrobials:      Subjective: No new complaints. Feels swelling is going down. No sob while laying in bed. Objective: Vitals:   01/30/20 1400 01/31/20 0056 01/31/20 0500 01/31/20 1222  BP: (!) 144/65 (!) 141/50  (!) 167/60  Pulse: 77 78  74  Resp: 15 20  16   Temp: 98.5 F (36.9 C) 98.3 F (36.8 C)  97.6 F (36.4 C)  TempSrc: Oral Oral  Oral  SpO2: 100% 98%  99%  Weight:   (!) 142.8 kg   Height:        Intake/Output Summary (Last 24 hours) at 01/31/2020 1301 Last data filed at 01/31/2020 1014 Gross per 24 hour  Intake 636.2 ml  Output 1900 ml  Net -1263.8 ml   Filed Weights   01/28/20 1558 01/29/20 0443 01/31/20 0500  Weight: (!) 140.6 kg (!) 145.3 kg (!) 142.8 kg    Examination:  General exam: Appears calm and comfortable, NAD, less puffy looking in the  face Respiratory system: more cta , no wheezing or crackles Cardiovascular system: S1 & S2 heard, RRR. No JVD, murmurs, rubs, gallops or clicks.  Gastrointestinal system: Abdomen is nondistended, soft and nontender.. Normal bowel sounds heard.  GU: scrotal edema slowly improving  Central nervous system: Alert and oriented.  Extremities: +edema b/l , but slowly improving. Skin: Warm dry Psychiatry:Mood & affect appropriate in current setting.     Data Reviewed: I have personally reviewed following labs and imaging studies  CBC: Recent Labs  Lab 01/28/20 1605 01/29/20 0539  WBC 4.7 4.3  HGB 7.8* 7.6*  HCT 25.6* 24.1*  MCV 94.8 92.7  PLT 122* 734*   Basic Metabolic Panel: Recent Labs  Lab 01/28/20 1605 01/29/20 0539 01/30/20 0629 01/31/20 0628  NA 142 143 143 143  K 4.9 4.3 4.1 3.4*  CL 113* 111 111 112*  CO2 23 23 24 24   GLUCOSE 61* 124* 163* 169*  BUN 42* 41* 41* 38*  CREATININE 2.43* 2.46* 2.57* 2.51*  CALCIUM 9.2 9.1 9.2 9.3  MG  --   --  2.0  --   PHOS  --   --  3.2  --    GFR: Estimated Creatinine Clearance: 40.2 mL/min (A) (by C-G formula based on SCr of 2.51 mg/dL (H)). Liver Function Tests: Recent Labs  Lab 01/28/20 1605 01/29/20 0539 01/30/20 0629  AST 35 31 22  ALT 29 24 20   ALKPHOS 89 82 64  BILITOT 0.9 0.8 0.7  PROT 7.3 6.5 6.5  ALBUMIN 3.0* 2.7* 3.2*   No results for input(s): LIPASE, AMYLASE in the last 168 hours. Recent Labs  Lab 01/28/20 1948  AMMONIA 70*   Coagulation Profile: Recent Labs  Lab 01/28/20 1922 01/29/20 0539  INR 1.2 1.3*   Cardiac Enzymes: No results for input(s): CKTOTAL, CKMB, CKMBINDEX, TROPONINI in the last 168 hours. BNP (last 3 results) No results for input(s): PROBNP in the last 8760 hours. HbA1C: No results for input(s): HGBA1C in the last 72 hours. CBG: Recent Labs  Lab 01/30/20 1155 01/30/20 1649 01/30/20 2112 01/31/20 0801 01/31/20 1134  GLUCAP 187* 201* 220* 139* 184*   Lipid Profile: No  results for input(s): CHOL, HDL, LDLCALC, TRIG, CHOLHDL, LDLDIRECT in the last 72 hours. Thyroid Function Tests: No results for input(s): TSH, T4TOTAL, FREET4, T3FREE, THYROIDAB in the last 72 hours. Anemia Panel: Recent Labs    01/30/20 0629 01/30/20 0633  VITAMINB12  --  795  FOLATE 16.4  --   FERRITIN 21*  --   TIBC 270  --   IRON 37*  --    Sepsis Labs: No results for input(s): PROCALCITON, LATICACIDVEN in the last 168 hours.  Recent Results (from the past 240 hour(s))  Respiratory Panel by RT PCR (Flu A&B, Covid) - Nasopharyngeal Swab     Status: None   Collection Time: 01/28/20 10:34 PM   Specimen: Nasopharyngeal Swab  Result Value Ref Range Status   SARS Coronavirus 2 by RT PCR NEGATIVE NEGATIVE Final    Comment: (NOTE) SARS-CoV-2 target nucleic acids are NOT DETECTED. The SARS-CoV-2 RNA is generally detectable in upper respiratoy specimens during the acute phase of infection. The lowest concentration of SARS-CoV-2 viral copies this assay can detect is 131 copies/mL. A negative result does not preclude SARS-Cov-2 infection and should not be used as the sole basis for treatment or other patient management decisions. A negative result may occur with  improper specimen collection/handling, submission of specimen other than nasopharyngeal swab, presence of viral mutation(s) within the areas targeted by this assay, and inadequate number of viral copies (<131 copies/mL). A negative result must be combined with clinical observations, patient history, and epidemiological information. The expected result is Negative. Fact Sheet for Patients:  PinkCheek.be Fact Sheet for Healthcare Providers:  GravelBags.it This test is not yet ap proved or cleared by the [place] FDA and  has been authorized for detection and/or diagnosis of SARS-CoV-2 by FDA under an Emergency Use Authorization (EUA). This EUA will remain  in  effect (meaning this test can be used) for the duration of the COVID-19 declaration under Section 564(b)(1) of the Act, 21 U.S.C. section 360bbb-3(b)(1), unless the authorization is terminated or revoked sooner.    Influenza A by PCR NEGATIVE NEGATIVE Final   Influenza B by PCR NEGATIVE NEGATIVE Final    Comment: (NOTE) The Xpert Xpress SARS-CoV-2/FLU/RSV assay is intended as an aid in  the diagnosis of influenza from Nasopharyngeal swab specimens and  should not be used as a sole basis for treatment. Nasal washings and  aspirates are unacceptable for Xpert Xpress SARS-CoV-2/FLU/RSV  testing. Fact Sheet for Patients: PinkCheek.be Fact Sheet for Healthcare Providers: GravelBags.it This test is not yet approved or cleared by the [place] FDA and  has been authorized for detection and/or diagnosis of SARS-CoV-2 by  FDA under an Emergency Use Authorization (EUA). This EUA will remain  in effect (meaning this test can be used) for the duration of  the  Covid-19 declaration under Section 564(b)(1) of the Act, 21  U.S.C. section 360bbb-3(b)(1), unless the authorization is  terminated or revoked. Performed at [facility], [address], [place],  [address]          Radiology Studies: US RENAL  Result Date: 01/30/2020 CLINICAL DATA:  Acute renal failure. Chronic kidney disease stage 4. EXAM: RENAL / URINARY TRACT ULTRASOUND COMPLETE COMPARISON:  None. FINDINGS: Limited exam due to patient body habitus and overlying bowel gas. Right Kidney: Renal measurements: 10.2 x 4.8 x 5.7 cm = volume: 146 mL . Echogenicity within normal limits. No mass or hydronephrosis visualized. Left Kidney: Renal measurements: 11.9 x 5.4 x 6.8 cm = volume: 233 mL. Echogenicity within normal limits. No mass or hydronephrosis visualized. Bladder: Appears normal for degree of bladder distention. Prevoid volume 511 mL. Other: None.  IMPRESSION: Limited exam demonstrating normal size kidneys without hydronephrosis. Electronically Signed   By: Marin Olp M.D.   On: 01/30/2020 15:58        Scheduled Meds: . atorvastatin  20 mg Oral q1800  . enoxaparin (LOVENOX) injection  40 mg Subcutaneous Q12H  . hydrALAZINE  25 mg Oral TID  . insulin aspart  0-9 Units Subcutaneous TID WC  . [START ON 02/01/2020] lactulose  30 g Oral Daily  . pantoprazole  40 mg Oral BID  . sodium chloride flush  3 mL Intravenous Once  . spironolactone  25 mg Oral Daily   Continuous Infusions: . albumin human 25 g (01/31/20 0502)  . furosemide (LASIX) infusion 8 mg/hr (01/31/20 0537)    Assessment & Plan:   Principal Problem:   Volume overload Active Problems:   Cirrhosis (HCC)   Thrombocytopenia (HCC)   Chronic venous stasis dermatitis of both lower extremities   Hypertension associated with diabetes ([place])   Type 2 diabetes mellitus with diabetic nephropathy, with long-term current use of insulin (HCC)   CKD (chronic kidney disease) stage 4, GFR 15-29 ml/min (HCC)   Hyperlipidemia associated with type 2 diabetes mellitus (HCC)  Acute on chronic DHF- very volume overloaded. BNP elevated, but likely underestimated due to his weight. Cards following Continue with lasix gtt Monitor labs    Hepatic cirrhosis with anasarca and visual hallucinations: Patient with significant volume overload in setting of cirrhosis, anasarca, acute on chronic diastolic heart failure.  Ultrasound scrotum shows significant scrotal edema with chronic right hydrocele.  He reports recent visual hallucinations but is otherwise alert and oriented at time of admission.  Ammonia is 70. -Continue Lasix drip -Monitor strict I/O's and daily weights -Continue lactulose, titrate to 2-3 bowel movements per day -Abdominal ultrasound with trace ascites -TTE on 12/16/2019 showed EF 60-65% with G1DD  CKD stage IV:  Nephrology input was appreciated, started on furosemide  drip and spironolactone.   Fluid restriction.   IV albumin given Replace potassium If he does not respond to medical treatment then dialysis may be more appropriate Per nephrology will consider EPO once cleared by GI (small pancreas mass ?)   Insulin-dependent type 2 diabetes: Hypoglycemic on ED arrival.    Held home NovoLog 70/30 and place on sensitive SSI for now.  Adjust as needed.  Anemia/thrombocytopenia: Chronic and stable without obvious bleeding in setting of cirrhosis and iron deficiency.  Continue to monitor.  Atrial fibrillation-continue with rate control and follow. Cardiology following, havediscussed chronic anticoagulation with the patient. Patient has esophageal varices which makes this fairly high risk. At this point will defer chronic anticoagulation due to his  varices..   Hypertension: Continue home hydralazine and IV Lasix as above.  Chronic venous stasis and edema of both lower extremities: Without obvious superimposed cellulitis.    Hyperlipidemia: Continue atorvastatin.  Anxiety: Continue as needed Ativan.  DVT prophylaxis: Lovenox Code Status: Full code Family Communication: none at bedside Disposition Plan: From home, likely discharge to home pending diuresis and symptomatic improvement Barrier: very volume overloaded with anasarca and acute on diastolic heart failure needing Lasix drip.Need to monitor renal function.       LOS: 2 days   Time spent: 45 minutes with more than 50% on [place], MD [facility] Pager [phone]  If 7PM-7AM, please contact night-coverage [email or web address] Password [email or web address] 01/31/2020, 1:01 PM Patient ID: AGOSTINO GORIN, male   DOB: 1951-06-21, 69 y.o.   MRN: [id number]

## 2020-01-31 NOTE — Progress Notes (Deleted)
Per request from pharmacy this nurse spoke to patient regarding his heparin allergy. Patient clarified that he was not allergic, but he did not want the shot because they hurt and left his abdomen bruised and painful.

## 2020-01-31 NOTE — Progress Notes (Signed)
Patient continues to leak around the rectal tube smaller amount at this time. Pads changed and patient cleaned up at this time. Eternal cath also leaking at this time. This device changed at this time. Patient tolerated well at this time. Safety checks completed and call light placed within reach.

## 2020-01-31 NOTE — Progress Notes (Signed)
Patient Name: Corey West Date of Encounter: 01/31/2020  Hospital Problem List     Principal Problem:   Volume overload Active Problems:   Cirrhosis (HCC)   Thrombocytopenia (HCC)   Chronic venous stasis dermatitis of both lower extremities   Hypertension associated with diabetes ([place])   Type 2 diabetes mellitus with diabetic nephropathy, with long-term current use of insulin (HCC)   CKD (chronic kidney disease) stage 4, GFR 15-29 ml/min (HCC)   Hyperlipidemia associated with type 2 diabetes mellitus ([place])    Patient Profile        69 y.o.malewith history ofhepatic cirrhosis, portal hypertension, dm, ckd iv, hypertension, hyperlipidemia with chronic lower extremety edema who was admitted with progressive sob and lower extremety edema. CXR showed possible mild pulmonary edema   Subjective   Has diuresed 4.388 liters since admission.   Inpatient Medications    . atorvastatin  20 mg Oral q1800  . enoxaparin (LOVENOX) injection  40 mg Subcutaneous Q12H  . hydrALAZINE  25 mg Oral TID  . insulin aspart  0-9 Units Subcutaneous TID WC  . lactulose  30 g Oral QID  . pantoprazole  40 mg Oral BID  . sodium chloride flush  3 mL Intravenous Once  . spironolactone  25 mg Oral Daily    Vital Signs    Vitals:   01/30/20 0455 01/30/20 1400 01/31/20 0056 01/31/20 0500  BP: (!) 145/57 (!) 144/65 (!) 141/50   Pulse: 81 77 78   Resp: 20 15 20    Temp: 98.6 F (37 C) 98.5 F (36.9 C) 98.3 F (36.8 C)   TempSrc: Oral Oral Oral   SpO2: 100% 100% 98%   Weight:    (!) 142.8 kg  Height:        Intake/Output Summary (Last 24 hours) at 01/31/2020 0920 Last data filed at 01/31/2020 0537 Gross per 24 hour  Intake 396.2 ml  Output 1900 ml  Net -1503.8 ml   Filed Weights   01/28/20 1558 01/29/20 0443 01/31/20 0500  Weight: (!) 140.6 kg (!) 145.3 kg (!) 142.8 kg    Physical Exam    GEN: Well nourished, well developed, in no acute distress.  HEENT: normal.  Neck: Supple, no  JVD, carotid bruits, or masses. Cardiac: RRR, no murmurs, rubs, or gallops. No clubbing, cyanosis, edema.  Radials/DP/PT 2+ and equal bilaterally.  Respiratory:  Respirations regular and unlabored, clear to auscultation bilaterally. GI: Soft, nontender, nondistended, BS + x 4. MS: no deformity or atrophy. Skin: warm and dry, no rash. Neuro:  Strength and sensation are intact. Psych: Normal affect.  Labs    CBC Recent Labs    01/28/20 1605 01/29/20 0539  WBC 4.7 4.3  HGB 7.8* 7.6*  HCT 25.6* 24.1*  MCV 94.8 92.7  PLT 122* 627*   Basic Metabolic Panel Recent Labs    01/30/20 0629 01/31/20 0628  NA 143 143  K 4.1 3.4*  CL 111 112*  CO2 24 24  GLUCOSE 163* 169*  BUN 41* 38*  CREATININE 2.57* 2.51*  CALCIUM 9.2 9.3  MG 2.0  --   PHOS 3.2  --    Liver Function Tests Recent Labs    01/29/20 0539 01/30/20 0629  AST 31 22  ALT 24 20  ALKPHOS 82 64  BILITOT 0.8 0.7  PROT 6.5 6.5  ALBUMIN 2.7* 3.2*   No results for input(s): LIPASE, AMYLASE in the last 72 hours. Cardiac Enzymes No results for input(s): CKTOTAL, CKMB, CKMBINDEX, TROPONINI in  the last 72 hours. BNP Recent Labs    01/28/20 1605 01/29/20 0539  BNP 217.0* 246.0*   D-Dimer No results for input(s): DDIMER in the last 72 hours. Hemoglobin A1C No results for input(s): HGBA1C in the last 72 hours. Fasting Lipid Panel No results for input(s): CHOL, HDL, LDLCALC, TRIG, CHOLHDL, LDLDIRECT in the last 72 hours. Thyroid Function Tests No results for input(s): TSH, T4TOTAL, T3FREE, THYROIDAB in the last 72 hours.  Invalid input(s): FREET3  Telemetry    nsr  ECG    nsr  Radiology    CT Head Wo Contrast  Result Date: 01/28/2020 CLINICAL DATA:  Hallucinations. Possible urinary tract infection. EXAM: CT HEAD WITHOUT CONTRAST TECHNIQUE: Contiguous axial images were obtained from the base of the skull through the vertex without intravenous contrast. COMPARISON:  04/28/2019 FINDINGS: Brain: No  evidence of acute infarction, hemorrhage, hydrocephalus, extra-axial collection or mass lesion/mass effect. Vascular: No hyperdense vessel or unexpected calcification. Skull: Normal. Negative for fracture or focal lesion. Sinuses/Orbits: No acute finding. Other: None. IMPRESSION: Negative exam. Electronically Signed   By: Nolon Nations M.D.   On: 01/28/2020 19:16   US RENAL  Result Date: 01/30/2020 CLINICAL DATA:  Acute renal failure. Chronic kidney disease stage 4. EXAM: RENAL / URINARY TRACT ULTRASOUND COMPLETE COMPARISON:  None. FINDINGS: Limited exam due to patient body habitus and overlying bowel gas. Right Kidney: Renal measurements: 10.2 x 4.8 x 5.7 cm = volume: 146 mL . Echogenicity within normal limits. No mass or hydronephrosis visualized. Left Kidney: Renal measurements: 11.9 x 5.4 x 6.8 cm = volume: 233 mL. Echogenicity within normal limits. No mass or hydronephrosis visualized. Bladder: Appears normal for degree of bladder distention. Prevoid volume 511 mL. Other: None. IMPRESSION: Limited exam demonstrating normal size kidneys without hydronephrosis. Electronically Signed   By: Marin Olp M.D.   On: 01/30/2020 15:58   DG Chest Port 1 View  Result Date: 01/28/2020 CLINICAL DATA:  Intermittent shortness of breath, diabetes EXAM: PORTABLE CHEST 1 VIEW COMPARISON:  12/14/2019 FINDINGS: The heart size and mediastinal contours are stable. Pulmonary vascular congestion with mild interstitial prominence, similar to the prior study. No large pleural fluid collection. Right costophrenic angle was not included on the film. No pneumothorax. The visualized skeletal structures are unremarkable. IMPRESSION: Pulmonary vascular congestion with mild interstitial prominence, similar to the prior study. Findings may reflect mild edema. Electronically Signed   By: Davina Poke D.O.   On: 01/28/2020 20:20   VAS [place] ABI WITH/WO TBI  Result Date: 01/07/2020 LOWER EXTREMITY DOPPLER STUDY Indications:  Ulceration.  Performing Technologist: Charlane Ferretti RT (R)(VS)  Examination Guidelines: A complete evaluation includes at minimum, Doppler waveform signals and systolic blood pressure reading at the level of bilateral brachial, anterior tibial, and posterior tibial arteries, when vessel segments are accessible. Bilateral testing is considered an integral part of a complete examination. Photoelectric Plethysmograph (PPG) waveforms and toe systolic pressure readings are included as required and additional duplex testing as needed. Limited examinations for reoccurring indications may be performed as noted.  ABI Findings: +---------+------------------+-----+---------+----------------+ Right    Rt Pressure (mmHg)IndexWaveform Comment          +---------+------------------+-----+---------+----------------+ Brachial 162                                              +---------+------------------+-----+---------+----------------+ ATA      196  1.10 triphasic                 +---------+------------------+-----+---------+----------------+ PTA                             triphasicNon compressable +---------+------------------+-----+---------+----------------+ Great Toe                       Normal   Non compressable +---------+------------------+-----+---------+----------------+ +---------+------------------+-----+----------+-------+ Left     Lt Pressure (mmHg)IndexWaveform  Comment +---------+------------------+-----+----------+-------+ Brachial 178                                      +---------+------------------+-----+----------+-------+ ATA      184               1.03 monophasic        +---------+------------------+-----+----------+-------+ PTA      183               1.03 monophasic        +---------+------------------+-----+----------+-------+ Great Toe140               0.79 Normal            +---------+------------------+-----+----------+-------+  Summary: Right: Resting right ankle-brachial index is within normal range. No evidence of significant right lower extremity arterial disease. The right toe-brachial index is normal. TBIs are unreliable. Left: Resting left ankle-brachial index is within normal range. No evidence of significant left lower extremity arterial disease. The left toe-brachial index is normal. *See table(s) above for measurements and observations.  Electronically signed by Hortencia Pilar MD on 01/07/2020 at 3:53:03 PM.   Final    [place] ASCITES (ABDOMEN LIMITED)  Result Date: 01/29/2020 CLINICAL DATA:  Cirrhosis. EXAM: ULTRASOUND ABDOMEN LIMITED COMPARISON:  None. FINDINGS: All 4 quadrants of the abdomen were cysts by ultrasound. There is trace ascites within the RIGHT lower quadrant and LEFT lower quadrant. No additional ascites demonstrated. IMPRESSION: Trace ascites. Electronically Signed   By: Franki Cabot M.D.   On: 01/29/2020 08:23   US SCROTUM W/DOPPLER  Result Date: 01/28/2020 CLINICAL DATA:  Scrotal swelling EXAM: SCROTAL ULTRASOUND DOPPLER ULTRASOUND OF THE TESTICLES TECHNIQUE: Complete ultrasound examination of the testicles, epididymis, and other scrotal structures was performed. Color and spectral Doppler ultrasound were also utilized to evaluate blood flow to the testicles. COMPARISON:  09/27/2013 FINDINGS: Right testicle Measurements: 2.8 x 1.5 x 1.8 cm. The testicle is small similar to that seen on prior exam. 7 mm hypoechoic focus is noted centrally within testicle are decreased in size when compared with the prior exam. Left testicle Measurements: 4.0 x 2.4 x 2.1 cm. No mass or microlithiasis visualized. Right epididymis:  Normal in size and appearance. Left epididymis:  8 mm cyst is noted within the left epididymis. Hydrocele:  Large right hydrocele is noted. Varicocele:  None visualized. Pulsed Doppler interrogation of both testes demonstrates normal low resistance arterial and venous waveforms bilaterally. Diffuse  scrotal edema is noted. IMPRESSION: The current scrotal swelling is related to diffuse scrotal edema. Chronic large right-sided hydrocele is again seen. Small hypoechoic focus within the right testicle which is decreased in size when compared with the prior exam from 2014 and likely of a benign etiology. Electronically Signed   By: Inez Catalina M.D.   On: 01/28/2020 20:50    Assessment & Plan    69 year old male with history of what  appears to be chronic atrial fibrillation, chronic lower extremity edema withHFpEFwith EF greater than 50%. Admitted with worsening peripheral edema and shortness of breath. He has been placed on Lasix drip with approximately 3 L thus far. He has had some clinical improvement with this. Chest x-ray revealed probable mild edema. BNP was mildly elevated.  Peripheral edema- Continue with compression devices and diuresis.  Continues to slowly diurese.  Renal function will need to be closely followed.HFpEF-as mentioned above, symptoms likely are diastolic in nature. Would continue with diuresis with Lasix drip. We will continue with hydralazine for blood pressure control at 25 mg daily.   Atrial fibrillation-continue with rate control and follow. Discussed chronic anticoagulation. Patient has esophageal varices which makes this fairly high risk. At this point will defer chronic anticoagulation due to his varices..  CKD stage IV-appreciate nephrology input. On Lasix drip. Creatinine 2.51. Electrolytes show mild hypokalemia. On spironolactone. May need supplementation of K if level remains low.   Esophageal varices with portal hypertension gastropathy-comanagement.  Signed, Javier Docker Alejandra Barna MD 01/31/2020, 9:20 AM  Pager: [phone]

## 2020-02-01 ENCOUNTER — Ambulatory Visit: Payer: Medicare Other | Admitting: Physician Assistant

## 2020-02-01 LAB — GLUCOSE, CAPILLARY
Glucose-Capillary: 122 mg/dL — ABNORMAL HIGH (ref 70–99)
Glucose-Capillary: 182 mg/dL — ABNORMAL HIGH (ref 70–99)
Glucose-Capillary: 149 mg/dL — ABNORMAL HIGH (ref 70–99)
Glucose-Capillary: 173 mg/dL — ABNORMAL HIGH (ref 70–99)

## 2020-02-01 LAB — BASIC METABOLIC PANEL
Anion gap: 8 (ref 5–15)
BUN: 37 mg/dL — ABNORMAL HIGH (ref 8–23)
CO2: 25 mmol/L (ref 22–32)
Calcium: 9.4 mg/dL (ref 8.9–10.3)
Chloride: 108 mmol/L (ref 98–111)
Creatinine, Ser: 2.43 mg/dL — ABNORMAL HIGH (ref 0.61–1.24)
GFR calc Af Amer: 31 mL/min — ABNORMAL LOW (ref >60)
GFR calc non Af Amer: 27 mL/min — ABNORMAL LOW (ref >60)
Glucose, Bld: 148 mg/dL — ABNORMAL HIGH (ref 70–99)
Potassium: 3.7 mmol/L (ref 3.5–5.1)
Sodium: 141 mmol/L (ref 135–145)

## 2020-02-01 LAB — KAPPA/LAMBDA LIGHT CHAINS
Kappa free light chain: 123.4 mg/L — ABNORMAL HIGH (ref 3.3–19.4)
Kappa, lambda light chain ratio: 1.43 (ref 0.26–1.65)
Lambda free light chains: 86.2 mg/L — ABNORMAL HIGH (ref 5.7–26.3)

## 2020-02-01 LAB — PARATHYROID HORMONE, INTACT (NO CA): PTH: 48 pg/mL (ref 15–65)

## 2020-02-01 LAB — AFP TUMOR MARKER: AFP, Serum, Tumor Marker: 2.6 ng/mL (ref 0.0–8.3)

## 2020-02-01 MED ORDER — ALBUMIN HUMAN 25 % IV SOLN
25.0000 g | Freq: Two times a day (BID) | INTRAVENOUS | Status: AC
  Administered 2020-02-01 – 2020-02-03 (×6): 25 g via INTRAVENOUS
  Filled 2020-02-01 (×6): qty 100

## 2020-02-01 MED ORDER — PSEUDOEPHEDRINE HCL 30 MG PO TABS
30.0000 mg | ORAL_TABLET | Freq: Two times a day (BID) | ORAL | Status: DC
  Administered 2020-02-01 – 2020-02-04 (×6): 30 mg via ORAL
  Filled 2020-02-01 (×8): qty 1

## 2020-02-01 NOTE — Progress Notes (Signed)
PROGRESS NOTE    Corey West  XNT:[id number] DOB: May 16, 1951 DOA: 01/28/2020 PCP: System, Pcp Not In    Brief Narrative:  Corey West is a 69 y.o. 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  Patient reports several days of progressive increase edema in bilateral lower extremities and scrotum.  He has been having associated shortness of breath.  He says he had visual hallucinations today but denies any confusion.  He has been taking lactulose with last bowel movement yesterday however states he did not take his lactulose today due to his significant scrotal discomfort.  He also notes that previously loosefitting pants are now tight on him.  He reports good urine output.  He denies any subjective fevers, chills, diaphoresis, chest pain.    Consultants:   Nephrology, cardiology  Procedures:   Antimicrobials:      Subjective: No new complaints. Would like to walk .starting to feel better Objective: Vitals:   01/31/20 0500 01/31/20 1222 02/01/20 0126 02/01/20 0437  BP:  (!) 167/60 (!) 142/51 (!) 138/59  Pulse:  74 83 78  Resp:  16 18 20   Temp:  97.6 F (36.4 C) 98.7 F (37.1 C) 98.7 F (37.1 C)  TempSrc:  Oral Oral Oral  SpO2:  99% 97% 96%  Weight: (!) 142.8 kg   (!) 140.5 kg  Height:        Intake/Output Summary (Last 24 hours) at 02/01/2020 0824 Last data filed at 02/01/2020 9675 Gross per 24 hour  Intake 670.23 ml  Output 1400 ml  Net -729.77 ml   Filed Weights   01/29/20 0443 01/31/20 0500 02/01/20 0437  Weight: (!) 145.3 kg (!) 142.8 kg (!) 140.5 kg    Examination:  General exam: Appears calm and comfortable, NAD,  Respiratory system: more cta ,  no wheezing or crackles, or rhonchi Cardiovascular system: S1 & S2 heard, RRR. No JVD, murmurs, rubs, gallops or clicks.  Gastrointestinal system: Abdomen is nondistended, soft and nontender.. Normal bowel sounds heard.  GU: scrotal edema present but improved Central nervous system: Alert and oriented.  Extremities: decreased pitting edema b/l  Skin: Warm dry Psychiatry:Mood & affect appropriate in current setting.     Data Reviewed: I have personally reviewed following labs and imaging studies  CBC: Recent Labs  Lab 01/28/20 1605 01/29/20 0539  WBC 4.7 4.3  HGB 7.8* 7.6*  HCT 25.6* 24.1*  MCV 94.8 92.7  PLT 122* 916*   Basic Metabolic Panel: Recent Labs  Lab 01/28/20 1605 01/29/20 0539 01/30/20 0629 01/31/20 0628 02/01/20 0504  NA 142 143 143 143 141  K 4.9 4.3 4.1 3.4* 3.7  CL 113* 111 111 112* 108  CO2 23 23 24 24 25   GLUCOSE 61* 124* 163* 169* 148*  BUN 42* 41* 41* 38* 37*  CREATININE 2.43* 2.46* 2.57* 2.51* 2.43*  CALCIUM 9.2 9.1 9.2 9.3 9.4  MG  --   --  2.0  --   --   PHOS  --   --  3.2  --   --    GFR: Estimated Creatinine Clearance: 41.1 mL/min (A) (by C-G formula based on SCr of 2.43 mg/dL (H)). Liver Function Tests: Recent Labs  Lab 01/28/20 1605 01/29/20 0539 01/30/20 0629  AST 35 31  22  ALT 29 24 20   ALKPHOS 89 82 64  BILITOT 0.9 0.8 0.7  PROT 7.3 6.5 6.5  ALBUMIN 3.0* 2.7* 3.2*   No results for input(s): LIPASE, AMYLASE in the last 168 hours. Recent Labs  Lab 01/28/20 1948  AMMONIA 70*   Coagulation Profile: Recent Labs  Lab 01/28/20 1922 01/29/20 0539  INR 1.2 1.3*   Cardiac Enzymes: No results for input(s): CKTOTAL, CKMB, CKMBINDEX, TROPONINI in the last 168 hours. BNP (last 3 results) No results for input(s): PROBNP in the last 8760 hours. HbA1C: No results for input(s): HGBA1C in the last 72 hours. CBG: Recent Labs  Lab 01/31/20 0801 01/31/20 1134 01/31/20 1642 01/31/20 2116 02/01/20 0739  GLUCAP 139* 184* 235*  185* 122*   Lipid Profile: No results for input(s): CHOL, HDL, LDLCALC, TRIG, CHOLHDL, LDLDIRECT in the last 72 hours. Thyroid Function Tests: No results for input(s): TSH, T4TOTAL, FREET4, T3FREE, THYROIDAB in the last 72 hours. Anemia Panel: Recent Labs    01/30/20 0629 01/30/20 0633  VITAMINB12  --  795  FOLATE 16.4  --   FERRITIN 21*  --   TIBC 270  --   IRON 37*  --    Sepsis Labs: No results for input(s): PROCALCITON, LATICACIDVEN in the last 168 hours.  Recent Results (from the past 240 hour(s))  Respiratory Panel by RT PCR (Flu A&B, Covid) - Nasopharyngeal Swab     Status: None   Collection Time: 01/28/20 10:34 PM   Specimen: Nasopharyngeal Swab  Result Value Ref Range Status   SARS Coronavirus 2 by RT PCR NEGATIVE NEGATIVE Final    Comment: (NOTE) SARS-CoV-2 target nucleic acids are NOT DETECTED. The SARS-CoV-2 RNA is generally detectable in upper respiratoy specimens during the acute phase of infection. The lowest concentration of SARS-CoV-2 viral copies this assay can detect is 131 copies/mL. A negative result does not preclude SARS-Cov-2 infection and should not be used as the sole basis for treatment or other patient management decisions. A negative result may occur with  improper specimen collection/handling, submission of specimen other than nasopharyngeal swab, presence of viral mutation(s) within the areas targeted by this assay, and inadequate number of viral copies (<131 copies/mL). A negative result must be combined with clinical observations, patient history, and epidemiological information. The expected result is Negative. Fact Sheet for Patients:  PinkCheek.be Fact Sheet for Healthcare Providers:  GravelBags.it This test is not yet ap proved or cleared by the [place] FDA and  has been authorized for detection and/or diagnosis of SARS-CoV-2 by FDA under an Emergency Use Authorization  (EUA). This EUA will remain  in effect (meaning this test can be used) for the duration of the COVID-19 declaration under Section 564(b)(1) of the Act, 21 U.S.C. section 360bbb-3(b)(1), unless the authorization is terminated or revoked sooner.    Influenza A by PCR NEGATIVE NEGATIVE Final   Influenza B by PCR NEGATIVE NEGATIVE Final    Comment: (NOTE) The Xpert Xpress SARS-CoV-2/FLU/RSV assay is intended as an aid in  the diagnosis of influenza from Nasopharyngeal swab specimens and  should not be used as a sole basis for treatment. Nasal washings and  aspirates are unacceptable for Xpert Xpress SARS-CoV-2/FLU/RSV  testing. Fact Sheet for Patients: PinkCheek.be Fact Sheet for Healthcare Providers: GravelBags.it This test is not yet approved or cleared by the [place] FDA and  has been authorized for detection and/or diagnosis of SARS-CoV-2 by  FDA under an Emergency Use Authorization (EUA). This EUA will remain  in effect (meaning this test can be used) for the duration of the  Covid-19 declaration under Section 564(b)(1) of the Act, 21  U.S.C. section 360bbb-3(b)(1), unless the authorization is  terminated or revoked. Performed at [facility], [address] [place], [place] [address]          Radiology Studies: US RENAL  Result Date: 01/30/2020 CLINICAL DATA:  Acute renal failure. Chronic kidney disease stage 4. EXAM: RENAL / URINARY TRACT ULTRASOUND COMPLETE COMPARISON:  None. FINDINGS: Limited exam due to patient body habitus and overlying bowel gas. Right Kidney: Renal measurements: 10.2 x 4.8 x 5.7 cm = volume: 146 mL . Echogenicity within normal limits. No mass or hydronephrosis visualized. Left Kidney: Renal measurements: 11.9 x 5.4 x 6.8 cm = volume: 233 mL. Echogenicity within normal limits. No mass or hydronephrosis visualized. Bladder: Appears normal for degree of bladder distention. Prevoid  volume 511 mL. Other: None. IMPRESSION: Limited exam demonstrating normal size kidneys without hydronephrosis. Electronically Signed   By: Marin Olp M.D.   On: 01/30/2020 15:58        Scheduled Meds: . atorvastatin  20 mg Oral q1800  . enoxaparin (LOVENOX) injection  40 mg Subcutaneous Q12H  . hydrALAZINE  25 mg Oral TID  . insulin aspart  0-9 Units Subcutaneous TID WC  . lactulose  30 g Oral Daily  . pantoprazole  40 mg Oral BID  . sodium chloride flush  3 mL Intravenous Once  . spironolactone  25 mg Oral Daily   Continuous Infusions: . furosemide (LASIX) infusion 8 mg/hr (02/01/20 0655)    Assessment & Plan:   Principal Problem:   Volume overload Active Problems:   Cirrhosis (HCC)   Thrombocytopenia (HCC)   Chronic venous stasis dermatitis of both lower extremities   Hypertension associated with diabetes ([place])   Type 2 diabetes mellitus with diabetic nephropathy, with long-term current use of insulin (HCC)   CKD (chronic kidney disease) stage 4, GFR 15-29 ml/min (HCC)   Hyperlipidemia associated with type 2 diabetes mellitus (HCC)  Acute on chronic DHF- very volume overloaded. BNP elevated, but likely underestimated due to his weight. Improving clinically slowly Cards following Continue with lasix gtt Monitor labs    Hepatic cirrhosis with anasarca and visual hallucinations: Patient with significant volume overload in setting of cirrhosis, anasarca, acute on chronic diastolic heart failure.  Ultrasound scrotum shows significant scrotal edema with chronic right hydrocele.  He reports recent visual hallucinations but is otherwise alert and oriented at time of admission.  Ammonia is 70. -Continue Lasix drip -Monitor strict I/O's and daily weights -Continue lactulose, titrate to 2-3 bowel movements per day -Abdominal ultrasound with trace ascites -TTE on 12/16/2019 showed EF 60-65% with G1DD  CKD stage IV:  Nephrology input was appreciated, started on furosemide  drip and spironolactone.   Fluid restriction.   IV albumin given If he does not respond to medical treatment then dialysis may be more appropriate Per nephrology will consider EPO once cleared by GI (small pancreas mass ?)   Insulin-dependent type 2 diabetes: Hypoglycemic on ED arrival.    Held home NovoLog 70/30 and place on sensitive SSI for now.  Adjust as needed.  Anemia/thrombocytopenia: Chronic and stable without obvious bleeding in setting of cirrhosis and iron deficiency.   Continue to monitor.  Atrial fibrillation-continue with rate control and follow. Cardiology following, havediscussed chronic anticoagulation with the patient. Patient has esophageal varices which makes this fairly high risk. At this point will defer chronic anticoagulation  due to his varices..   Hypertension: Continue home hydralazine and IV Lasix as above.  Chronic venous stasis and edema of both lower extremities: Without obvious superimposed cellulitis.    Hyperlipidemia: Continue atorvastatin.  Anxiety: Continue as needed Ativan.  DVT prophylaxis: Lovenox Code Status: Full code Family Communication: none at bedside Disposition Plan: From home, likely discharge to home pending diuresis and symptomatic improvement Barrier: very volume overloaded with anasarca and acute on diastolic heart failure needing Lasix drip.Need to monitor renal function.       LOS: 3 days   Time spent: 45 minutes with more than 50% on [place], MD [facility] Pager [phone]  If 7PM-7AM, please contact night-coverage [email or web address] Password [facility] 02/01/2020, 8:24 AM Patient ID: Corey West, male   DOB: 1951/08/03, 69 y.o.   MRN: [id number] Patient ID: Corey West, male   DOB: [birth year], 69 y.o.   MRN: [id number]

## 2020-02-01 NOTE — Care Management Important Message (Signed)
Important Message  Patient Details  Name: Corey West MRN: [id number] Date of Birth: [birth year]   Medicare Important Message Given:  Yes     Dannette Barbara 02/01/2020, 12:28 PM

## 2020-02-01 NOTE — Evaluation (Signed)
Occupational Therapy Evaluation Patient Details Name: Corey West MRN: [id number] DOB: August 05, 1951 Today's Date: 02/01/2020    History of Present Illness Pt. is a 69 y.o. male who was admitted to [facility] with LE edema, and scrotal pain. Pt. PMHx includes: Hepatic Cirrhosis, with espohageal Varices, portal HTN, gastropathy, IDDM Type 2, and CKD Stage 4, HTN, Hyperlipidemia, Chronic Venous status dermatitis, and Anxiety.   Clinical Impression   Pt. presents with weakness, limited activity tolerance, BLE edema, history of scrotal pain, and limited functional mobility which limits the ability to complete basic ADL and IADL functioning. Pt. Resides at home alone in a condo. Pt. Has 3-4 steps to enter the front porch, and 1 step into the condo. Pt. was independent with ADLs, taking mostly sinkside baths. Pt. Reports independence with meal preparation, and medication management. Pt. Was able to drive. Pt. Has a supportive neighbor/friend who assist the pt. Pt. Reports receiving homecare services for wound care. Pt. Education was provided about A/E use for LE ADLs. Pt. Reports having the equipment at home, and is familiar with how to use it. Pt. Was provided with an educational handout about work simplification strategies for ADLs, and IADL. Pt. Was distracted during the session reporting that he was waiting for his nurse regarding a personal issue. Nursing was notified that the pt. was waiting. Pt. Could benefit from follow-up OT services to review  ADL training, A/E training, and pt. education about Energy conservation, and work simplification techniques, home modification, and DME.    Follow Up Recommendations  Home health OT    Equipment Recommendations  None recommended by OT    Recommendations for Other Services       Precautions / Restrictions Precautions Precautions: None Restrictions Weight Bearing Restrictions: No      Mobility Bed Mobility                  Transfers                 General transfer comment: Deferred    Balance                                           ADL either performed or assessed with clinical judgement   ADL                                         General ADL Comments: Pt. education was provided about A/E for ADLs, and work simplification startegies     Vision Baseline Vision/History: Wears glasses Patient Visual Report: No change from baseline       Perception     Praxis      Pertinent Vitals/Pain Pain Assessment: No/denies pain     Hand Dominance     Extremity/Trunk Assessment Upper Extremity Assessment Upper Extremity Assessment: Overall WFL for tasks assessed           Communication Communication Communication: No difficulties   Cognition Arousal/Alertness: Awake/alert Behavior During Therapy: WFL for tasks assessed/performed                                       General Comments       Exercises     Shoulder  Instructions      Home Living Family/patient expects to be discharged to:: Private residence Living Arrangements: Alone Available Help at Discharge: Friend(s) Type of Home: ([place]) Home Access: Stairs to enter [employer] of Steps: 3 Entrance Stairs-Rails: Right Home Layout: One level     Bathroom Shower/Tub: Tub/shower unit         Home Equipment: Environmental consultant - 2 wheels;Cane - single point          Prior Functioning/Environment Level of Independence: Independent with assistive device(s)        Comments: Pt. reports being independent with ADLs and tasks. Pt. reports taking sinkside baths. Independent with meal preparation, medication management, and driving.        OT Problem List: Decreased strength;Decreased activity tolerance;Decreased knowledge of use of DME or AE      OT Treatment/Interventions:      OT Goals(Current goals can be found in the care plan section) Acute Rehab OT Goals Patient Stated Goal: To  return home OT Goal Formulation: With patient Time For Goal Achievement: 02/18/20 Potential to Achieve Goals: Good  OT Frequency:     Barriers to D/C:            Co-evaluation              AM-PAC OT "6 Clicks" Daily Activity     Outcome Measure Help from another person eating meals?: None Help from another person taking care of personal grooming?: None Help from another person toileting, which includes using toliet, bedpan, or urinal?: A Lot Help from another person bathing (including washing, rinsing, drying)?: A Lot Help from another person to put on and taking off regular upper body clothing?: None Help from another person to put on and taking off regular lower body clothing?: A Lot 6 Click Score: 18   End of Session    Activity Tolerance: Patient tolerated treatment well Patient left: in bed;with call bell/phone within reach;with bed alarm set  OT Visit Diagnosis: Muscle weakness (generalized) (M62.81)                Time: 7673-4193 OT Time Calculation (min): 15 min Charges:  OT General Charges $OT Visit: 1 Visit  Harrel Carina, MS, OTR/L  Harrel Carina 02/01/2020, 11:48 AM

## 2020-02-01 NOTE — Progress Notes (Signed)
[facility], [place] 02/01/20  Subjective:   Hospital day # 3  Renal consult for volume overload and anasarca  continues to have large amount of LE edema and scrotal edema but feels like that it is improving with IV Lasix Still having loose stools from lactulose, less today No SOB   Objective:  Vital signs in last 24 hours:  Temp:  [97.6 F (36.4 C)-98.7 F (37.1 C)] 98.7 F (37.1 C) (05/03 0437) Pulse Rate:  [74-83] 78 (05/03 0437) Resp:  [16-20] 20 (05/03 0437) BP: (138-167)/(51-60) 138/59 (05/03 0437) SpO2:  [96 %-99 %] 96 % (05/03 0437) Weight:  [140.5 kg] 140.5 kg (05/03 0437)  Weight change: -2.3 kg Filed Weights   01/29/20 0443 01/31/20 0500 02/01/20 0437  Weight: (!) 145.3 kg (!) 142.8 kg (!) 140.5 kg    Intake/Output:    Intake/Output Summary (Last 24 hours) at 02/01/2020 1125 Last data filed at 02/01/2020 1016 Gross per 24 hour  Intake 670.23 ml  Output 1400 ml  Net -729.77 ml     Physical Exam: General: NAD, laying in bed,   HEENT Anicteric   Pulm/lungs Coarse breath sounds, room air  CVS/Heart regular  Abdomen:  Soft, NT  Extremities: ++ edema b.l, ++ scrotal edema  Neurologic: Alert, oriented  Skin: Stasis dermatitis   Rectal tube   External urinary catheter    Basic Metabolic Panel:  Recent Labs  Lab 01/28/20 1605 01/28/20 1605 01/29/20 0539 01/29/20 0539 01/30/20 0629 01/31/20 0628 02/01/20 0504  NA 142  --  143  --  143 143 141  K 4.9  --  4.3  --  4.1 3.4* 3.7  CL 113*  --  111  --  111 112* 108  CO2 23  --  23  --  24 24 25   GLUCOSE 61*  --  124*  --  163* 169* 148*  BUN 42*  --  41*  --  41* 38* 37*  CREATININE 2.43*  --  2.46*  --  2.57* 2.51* 2.43*  CALCIUM 9.2   < > 9.1   < > 9.2 9.3 9.4  MG  --   --   --   --  2.0  --   --   PHOS  --   --   --   --  3.2  --   --    < > = values in this interval not displayed.     CBC: Recent Labs  Lab 01/28/20 1605 01/29/20 0539  WBC 4.7 4.3  HGB  7.8* 7.6*  HCT 25.6* 24.1*  MCV 94.8 92.7  PLT 122* 114*      Lab Results  Component Value Date   HEPBSAG NON REACTIVE 01/30/2020   HEPBSAB NON REACTIVE 01/30/2020   HEPBIGM NON REACTIVE 01/30/2020      Microbiology:  Recent Results (from the past 240 hour(s))  Respiratory Panel by RT PCR (Flu A&B, Covid) - Nasopharyngeal Swab     Status: None   Collection Time: 01/28/20 10:34 PM   Specimen: Nasopharyngeal Swab  Result Value Ref Range Status   SARS Coronavirus 2 by RT PCR NEGATIVE NEGATIVE Final    Comment: (NOTE) SARS-CoV-2 target nucleic acids are NOT DETECTED. The SARS-CoV-2 RNA is generally detectable in upper respiratoy specimens during the acute phase of infection. The lowest concentration of SARS-CoV-2 viral copies this assay can detect is 131 copies/mL. A negative result does not preclude SARS-Cov-2 infection and should not be used as the  sole basis for treatment or other patient management decisions. A negative result may occur with  improper specimen collection/handling, submission of specimen other than nasopharyngeal swab, presence of viral mutation(s) within the areas targeted by this assay, and inadequate number of viral copies (<131 copies/mL). A negative result must be combined with clinical observations, patient history, and epidemiological information. The expected result is Negative. Fact Sheet for Patients:  PinkCheek.be Fact Sheet for Healthcare Providers:  GravelBags.it This test is not yet ap proved or cleared by the [place] FDA and  has been authorized for detection and/or diagnosis of SARS-CoV-2 by FDA under an Emergency Use Authorization (EUA). This EUA will remain  in effect (meaning this test can be used) for the duration of the COVID-19 declaration under Section 564(b)(1) of the Act, 21 U.S.C. section 360bbb-3(b)(1), unless the authorization is terminated or revoked sooner.     Influenza A by PCR NEGATIVE NEGATIVE Final   Influenza B by PCR NEGATIVE NEGATIVE Final    Comment: (NOTE) The Xpert Xpress SARS-CoV-2/FLU/RSV assay is intended as an aid in  the diagnosis of influenza from Nasopharyngeal swab specimens and  should not be used as a sole basis for treatment. Nasal washings and  aspirates are unacceptable for Xpert Xpress SARS-CoV-2/FLU/RSV  testing. Fact Sheet for Patients: PinkCheek.be Fact Sheet for Healthcare Providers: GravelBags.it This test is not yet approved or cleared by the [place] FDA and  has been authorized for detection and/or diagnosis of SARS-CoV-2 by  FDA under an Emergency Use Authorization (EUA). This EUA will remain  in effect (meaning this test can be used) for the duration of the  Covid-19 declaration under Section 564(b)(1) of the Act, 21  U.S.C. section 360bbb-3(b)(1), unless the authorization is  terminated or revoked. Performed at [facility], [address] [place], [place] [address]     Coagulation Studies: No results for input(s): LABPROT, INR in the last 72 hours.  Urinalysis: No results for input(s): COLORURINE, LABSPEC, PHURINE, GLUCOSEU, HGBUR, BILIRUBINUR, KETONESUR, PROTEINUR, UROBILINOGEN, NITRITE, LEUKOCYTESUR in the last 72 hours.  Invalid input(s): APPERANCEUR    Imaging: No results found.   Medications:   . furosemide (LASIX) infusion 8 mg/hr (02/01/20 0655)   . atorvastatin  20 mg Oral q1800  . enoxaparin (LOVENOX) injection  40 mg Subcutaneous Q12H  . hydrALAZINE  25 mg Oral TID  . insulin aspart  0-9 Units Subcutaneous TID WC  . lactulose  30 g Oral Daily  . pantoprazole  40 mg Oral BID  . sodium chloride flush  3 mL Intravenous Once  . spironolactone  25 mg Oral Daily   LORazepam, ondansetron **OR** ondansetron (ZOFRAN) IV, sodium chloride, traMADol  Assessment/ Plan:  69 y.o.White male with hepatic cirrhosis with  history of NASH and alcohol, obstructive sleep apnea, hypertension, diabetes mellitus type II, diabetic neuropathy, Barrett's, GAVE, hyperlipidemia     admitted on 01/28/2020 for Orthopnea [R06.01] Scrotal swelling [N50.89] Volume overload [E87.70] Increased ammonia level [R79.89]  # Acute renal failure on chronic kidney disease stage IV with Anasarca:  baseline creatinine of 2.5, GFR 26 01/14/2020.  Not currently on ACE-I/ARB at home.  Chronic kidney disease secondary to diabetes, hypertension.  Acute renal failure secondary to acute cardiorenal syndrome 05/02 0701 - 05/03 0700 In: 670.2 [P.O.:480; I.V.:90.2; IV Piggyback:100.1] Out: 1400 [Urine:1000; Stool:400]  Lab Results  Component Value Date   CREATININE 2.43 (H) 02/01/2020   CREATININE 2.51 (H) 01/31/2020   CREATININE 2.57 (H) 01/30/2020    #Cirrhosis with ascites  and volume overload   #Anemia of chronic kidney disease and history of GI bleed. Thrombocytopenia.  Lab Results  Component Value Date   HGB 7.6 (L) 01/29/2020    # Diabetes mellitus type II with chronic kidney disease: insulin dependent. Lab Results  Component Value Date   HGBA1C 6.6 (H) 12/15/2019    Plan - continue furosemide drip - Continue spironolactone - fluid restriction.  - IV albumin.  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 Any transcription errors are unintentional

## 2020-02-01 NOTE — Progress Notes (Signed)
Physical Therapy Evaluation Patient Details Name: Corey West MRN: [id number] DOB: [birth year] Today's Date: 02/01/2020   History of Present Illness  Pt. is a 69 y.o. male who was admitted to [facility] with LE edema, and scrotal pain. Pt. PMHx includes: Hepatic Cirrhosis, with espohageal Varices, portal HTN, gastropathy, IDDM Type 2, and CKD Stage 4, HTN, Hyperlipidemia, Chronic Venous status dermatitis, and Anxiety.  Clinical Impression  Pt admitted with above diagnosis. Pt currently with functional limitations due to the deficits listed below (see PT Problem List). Pt with increased effort, time, and fatigue with bed mobility during evaluation. He requires minA+1 to stabilize with sit to stand transfers. He refuses to put shoes on in sitting and while attempting to put them on standing requires min/modA+1 from therapist to prevent him from falling while pt supports himself with rolling walker. Pt requires minA+1 to stabilize during ambulation due to impaired balance. After walking to door and back to bed he is completely exhausted with increased respiration rate and visible fatigue. SpO2 remains around 91% after ambulation for at least 2 minutes. He is currently unsafe to return home alone and will need SNF placement to facilitate safe transition back home. Currently pt refuses SNF placement. Pt will benefit from PT services to address deficits in strength, balance, and mobility in order to return to full function at home.      Follow Up Recommendations SNF(Pt currently refusing SNF)    Equipment Recommendations  None recommended by PT;Other (comment)(Pt advised to use his rolling walker at discharge)    Recommendations for Other Services       Precautions / Restrictions Precautions Precautions: Fall Restrictions Weight Bearing Restrictions: No      Mobility  Bed Mobility Overal bed mobility: Modified Independent             General bed mobility comments: Pt with increased effort,  time, and fatigue with bed mobility.   Transfers Overall transfer level: Needs assistance Equipment used: Rolling walker (2 wheeled) Transfers: Sit to/from Stand Sit to Stand: Min assist         General transfer comment: Pt requires minA+1 to stabilize with sit to stand transfers. He refuses to put shoes on in sitting and while attempting to put them on standing requires min/modA+1 from therapist to prevent him from falling while pt supports himself with rolling walker  Ambulation/Gait Ambulation/Gait assistance: Min assist Gait Distance (Feet): 20 Feet Assistive device: Rolling walker (2 wheeled)       General Gait Details: Pt requires minA+1 to stabilize during ambulation due to impaired balance. After walking to door and back to bed he is completely exhausted with increased respiration rate and visible fatigue. SpO2 remains around 91% after ambulation for at least 2 minutes.  Stairs            Wheelchair Mobility    Modified Rankin (Stroke Patients Only)       Balance Overall balance assessment: Needs assistance Sitting-balance support: No upper extremity supported Sitting balance-Leahy Scale: Good     Standing balance support: No upper extremity supported Standing balance-Leahy Scale: Fair Standing balance comment: Poor dynamic standing balance demonstrated with therapist assist to prevent him from falling                             Pertinent Vitals/Pain Pain Assessment: No/denies pain    Home Living Family/patient expects to be discharged to:: Private residence Living Arrangements: Alone Available Help at  Discharge: Friend(s) Type of Home: Other(Comment)(Condo) Home Access: Stairs to enter Entrance Stairs-Rails: Right Entrance Stairs-Number of Steps: 4 Home Layout: One level Home Equipment: Walker - 2 wheels;Cane - single point      Prior Function Level of Independence: Independent with assistive device(s)         Comments: Pt.  reports being independent with ADLs and tasks. Pt. reports taking sinkside baths. Independent with meal preparation, medication management, and driving.     Hand Dominance   Dominant Hand: Right    Extremity/Trunk Assessment   Upper Extremity Assessment Upper Extremity Assessment: Overall WFL for tasks assessed    Lower Extremity Assessment Lower Extremity Assessment: Generalized weakness       Communication   Communication: No difficulties  Cognition Arousal/Alertness: Awake/alert Behavior During Therapy: WFL for tasks assessed/performed Overall Cognitive Status: Within Functional Limits for tasks assessed                                 General Comments: Pt is AOx4 but does appear to have some deficits with insight/judgement      General Comments      Exercises     Assessment/Plan    PT Assessment Patient needs continued PT services  PT Problem List Decreased strength;Decreased activity tolerance;Decreased balance;Decreased mobility;Decreased safety awareness       PT Treatment Interventions DME instruction;Gait training;Stair training;Functional mobility training;Therapeutic activities;Therapeutic exercise;Balance training;Neuromuscular re-education;Patient/family education    PT Goals (Current goals can be found in the Care Plan section)  Acute Rehab PT Goals Patient Stated Goal: To return home PT Goal Formulation: With patient Time For Goal Achievement: 02/15/20 Potential to Achieve Goals: Fair    Frequency Min 2X/week   Barriers to discharge        Co-evaluation               AM-PAC PT "6 Clicks" Mobility  Outcome Measure Help needed turning from your back to your side while in a flat bed without using bedrails?: None Help needed moving from lying on your back to sitting on the side of a flat bed without using bedrails?: None Help needed moving to and from a bed to a chair (including a wheelchair)?: A Little Help needed standing  up from a chair using your arms (e.g., wheelchair or bedside chair)?: A Little Help needed to walk in hospital room?: A Lot Help needed climbing 3-5 steps with a railing? : Total 6 Click Score: 17    End of Session Equipment Utilized During Treatment: Gait belt Activity Tolerance: Patient limited by fatigue Patient left: in bed;with call bell/phone within reach;with bed alarm set Nurse Communication: Mobility status PT Visit Diagnosis: Unsteadiness on feet (R26.81);Muscle weakness (generalized) (M62.81);Difficulty in walking, not elsewhere classified (R26.2)    Time: 0160-1093 PT Time Calculation (min) (ACUTE ONLY): 23 min   Charges:   PT Evaluation $PT Eval Low Complexity: 1 Low          Americo Vallery D Yalitza Teed PT, DPT, GCS   Kaoru Rezendes 02/01/2020, 3:01 PM

## 2020-02-02 ENCOUNTER — Inpatient Hospital Stay: Payer: Medicare Other

## 2020-02-02 LAB — CBC
HCT: 22.1 % — ABNORMAL LOW (ref 39.0–52.0)
Hemoglobin: 6.8 g/dL — ABNORMAL LOW (ref 13.0–17.0)
MCH: 28.8 pg (ref 26.0–34.0)
MCHC: 30.8 g/dL (ref 30.0–36.0)
MCV: 93.6 fL (ref 80.0–100.0)
Platelets: 96 10*3/uL — ABNORMAL LOW (ref 150–400)
RBC: 2.36 MIL/uL — ABNORMAL LOW (ref 4.22–5.81)
RDW: 15.2 % (ref 11.5–15.5)
WBC: 7.1 10*3/uL (ref 4.0–10.5)
nRBC: 0 % (ref 0.0–0.2)

## 2020-02-02 LAB — BASIC METABOLIC PANEL
Anion gap: 8 (ref 5–15)
BUN: 44 mg/dL — ABNORMAL HIGH (ref 8–23)
CO2: 25 mmol/L (ref 22–32)
Calcium: 9.3 mg/dL (ref 8.9–10.3)
Chloride: 107 mmol/L (ref 98–111)
Creatinine, Ser: 2.73 mg/dL — ABNORMAL HIGH (ref 0.61–1.24)
GFR calc Af Amer: 27 mL/min — ABNORMAL LOW (ref >60)
GFR calc non Af Amer: 23 mL/min — ABNORMAL LOW (ref >60)
Glucose, Bld: 150 mg/dL — ABNORMAL HIGH (ref 70–99)
Potassium: 3.5 mmol/L (ref 3.5–5.1)
Sodium: 140 mmol/L (ref 135–145)

## 2020-02-02 LAB — GLUCOSE, CAPILLARY
Glucose-Capillary: 162 mg/dL — ABNORMAL HIGH (ref 70–99)
Glucose-Capillary: 154 mg/dL — ABNORMAL HIGH (ref 70–99)
Glucose-Capillary: 131 mg/dL — ABNORMAL HIGH (ref 70–99)
Glucose-Capillary: 139 mg/dL — ABNORMAL HIGH (ref 70–99)

## 2020-02-02 LAB — HEMOGLOBIN AND HEMATOCRIT, BLOOD
HCT: 23 % — ABNORMAL LOW (ref 39.0–52.0)
Hemoglobin: 7.4 g/dL — ABNORMAL LOW (ref 13.0–17.0)

## 2020-02-02 LAB — AMMONIA: Ammonia: 56 umol/L — ABNORMAL HIGH (ref 9–35)

## 2020-02-02 LAB — OCCULT BLOOD X 1 CARD TO LAB, STOOL: Fecal Occult Bld: POSITIVE — AB

## 2020-02-02 LAB — PREPARE RBC (CROSSMATCH)

## 2020-02-02 MED ORDER — ACETAMINOPHEN 325 MG PO TABS
650.0000 mg | ORAL_TABLET | Freq: Once | ORAL | Status: DC
  Filled 2020-02-02: qty 2

## 2020-02-02 MED ORDER — SODIUM CHLORIDE 0.9% IV SOLUTION: Freq: Once | INTRAVENOUS | Status: AC

## 2020-02-02 MED ORDER — LACTULOSE 10 GM/15ML PO SOLN
30.0000 g | Freq: Three times a day (TID) | ORAL | Status: DC
  Administered 2020-02-02 – 2020-02-05 (×8): 30 g via ORAL
  Filled 2020-02-02 (×8): qty 60

## 2020-02-02 MED ORDER — FUROSEMIDE 10 MG/ML IJ SOLN
8.0000 mg/h | INTRAVENOUS | Status: DC
  Administered 2020-02-02 – 2020-02-10 (×7): 8 mg/h via INTRAVENOUS
  Filled 2020-02-02 (×8): qty 25

## 2020-02-02 NOTE — Progress Notes (Signed)
PROGRESS NOTE    Corey West  QBH:[id number] DOB: 01-31-1951 DOA: 01/28/2020 PCP: System, Pcp Not In    Brief Narrative:  Corey West is a 69 y.o. 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  Patient reports several days of progressive increase edema in bilateral lower extremities and scrotum.  He has been having associated shortness of breath.  He says he had visual hallucinations today but denies any confusion.  He has been taking lactulose with last bowel movement yesterday however states he did not take his lactulose today due to his significant scrotal discomfort.  He also notes that previously loosefitting pants are now tight on him.  He reports good urine output.  He denies any subjective fevers, chills, diaphoresis, chest pain.    Consultants:   Nephrology, cardiology  Procedures:   Antimicrobials:      Subjective: No new complaints.  He is upset that his lactulose was decreased.  Although he is the one that made the decision for [place] to decrease the lactulose because he was having too much bowel movements.  He was upset because he was confused overnight.  He thought this was due to his lower dose of lactulose.  His ammonium is actually less than a week ago.  I have told him I will increase it again. Objective: Vitals:   02/01/20 1157 02/01/20 2113 02/02/20 0500 02/02/20 0601  BP: (!) 152/62 (!) 171/57  (!) 151/72  Pulse: 77 76  72  Resp: 20 20  20   Temp: 98.9 F (37.2 C) 98.8 F (37.1 C)  98.6 F (37 C)  TempSrc: Oral Oral  Oral  SpO2: 95% 97%  96%  Weight:   (!) 143.3 kg   Height:        Intake/Output Summary (Last 24 hours) at 02/02/2020 0856 Last  data filed at 02/02/2020 0601 Gross per 24 hour  Intake 719.52 ml  Output 950 ml  Net -230.48 ml   Filed Weights   01/31/20 0500 02/01/20 0437 02/02/20 0500  Weight: (!) 142.8 kg (!) 140.5 kg (!) 143.3 kg    Examination:  General exam: Appears calm and comfortable, NAD,  Respiratory system: Clear to auscultation, no crackles wheezing or rhonchi  cardiovascular system: S1 & S2 heard, RRR. No JVD, murmurs, rubs, gallops or clicks.  Gastrointestinal system: Abdomen is nondistended, soft and nontender.. Normal bowel sounds heard.  GU: scrotal edema present but proving  Central nervous system: Alert and oriented.  Grossly intact Extremities: decreased pitting edema b/l with chronic skin changes Skin: Warm dry Psychiatry:Mood & affect appropriate in current setting.     Data Reviewed: I have personally reviewed following labs and imaging studies  CBC: Recent Labs  Lab 01/28/20 1605 01/29/20 0539 02/02/20 0535  WBC 4.7 4.3 7.1  HGB 7.8* 7.6* 6.8*  HCT 25.6* 24.1* 22.1*  MCV 94.8 92.7 93.6  PLT 122* 114* 96*   Basic Metabolic Panel: Recent Labs  Lab 01/29/20 0539 01/30/20 0629 01/31/20 0628 02/01/20 0504 02/02/20 0535  NA 143 143 143 141 140  K 4.3 4.1 3.4* 3.7 3.5  CL 111 111 112* 108 107  CO2 23 24 24 25 25   GLUCOSE 124* 163* 169* 148* 150*  BUN 41* 41* 38* 37* 44*  CREATININE 2.46*  2.57* 2.51* 2.43* 2.73*  CALCIUM 9.1 9.2 9.3 9.4 9.3  MG  --  2.0  --   --   --   PHOS  --  3.2  --   --   --    GFR: Estimated Creatinine Clearance: 37 mL/min (A) (by C-G formula based on SCr of 2.73 mg/dL (H)). Liver Function Tests: Recent Labs  Lab 01/28/20 1605 01/29/20 0539 01/30/20 0629  AST 35 31 22  ALT 29 24 20   ALKPHOS 89 82 64  BILITOT 0.9 0.8 0.7  PROT 7.3 6.5 6.5  ALBUMIN 3.0* 2.7* 3.2*   No results for input(s): LIPASE, AMYLASE in the last 168 hours. Recent Labs  Lab 01/28/20 1948  AMMONIA 70*   Coagulation Profile: Recent Labs  Lab 01/28/20 1922  01/29/20 0539  INR 1.2 1.3*   Cardiac Enzymes: No results for input(s): CKTOTAL, CKMB, CKMBINDEX, TROPONINI in the last 168 hours. BNP (last 3 results) No results for input(s): PROBNP in the last 8760 hours. HbA1C: No results for input(s): HGBA1C in the last 72 hours. CBG: Recent Labs  Lab 02/01/20 0739 02/01/20 1155 02/01/20 1630 02/01/20 2111 02/02/20 0848  GLUCAP 122* 182* 149* 173* 162*   Lipid Profile: No results for input(s): CHOL, HDL, LDLCALC, TRIG, CHOLHDL, LDLDIRECT in the last 72 hours. Thyroid Function Tests: No results for input(s): TSH, T4TOTAL, FREET4, T3FREE, THYROIDAB in the last 72 hours. Anemia Panel: No results for input(s): VITAMINB12, FOLATE, FERRITIN, TIBC, IRON, RETICCTPCT in the last 72 hours. Sepsis Labs: No results for input(s): PROCALCITON, LATICACIDVEN in the last 168 hours.  Recent Results (from the past 240 hour(s))  Respiratory Panel by RT PCR (Flu A&B, Covid) - Nasopharyngeal Swab     Status: None   Collection Time: 01/28/20 10:34 PM   Specimen: Nasopharyngeal Swab  Result Value Ref Range Status   SARS Coronavirus 2 by RT PCR NEGATIVE NEGATIVE Final    Comment: (NOTE) SARS-CoV-2 target nucleic acids are NOT DETECTED. The SARS-CoV-2 RNA is generally detectable in upper respiratoy specimens during the acute phase of infection. The lowest concentration of SARS-CoV-2 viral copies this assay can detect is 131 copies/mL. A negative result does not preclude SARS-Cov-2 infection and should not be used as the sole basis for treatment or other patient management decisions. A negative result may occur with  improper specimen collection/handling, submission of specimen other than nasopharyngeal swab, presence of viral mutation(s) within the areas targeted by this assay, and inadequate number of viral copies (<131 copies/mL). A negative result must be combined with clinical observations, patient history, and epidemiological information. The expected  result is Negative. Fact Sheet for Patients:  PinkCheek.be Fact Sheet for Healthcare Providers:  GravelBags.it This test is not yet ap proved or cleared by the [place] FDA and  has been authorized for detection and/or diagnosis of SARS-CoV-2 by FDA under an Emergency Use Authorization (EUA). This EUA will remain  in effect (meaning this test can be used) for the duration of the COVID-19 declaration under Section 564(b)(1) of the Act, 21 U.S.C. section 360bbb-3(b)(1), unless the authorization is terminated or revoked sooner.    Influenza A by PCR NEGATIVE NEGATIVE Final   Influenza B by PCR NEGATIVE NEGATIVE Final    Comment: (NOTE) The Xpert Xpress SARS-CoV-2/FLU/RSV assay is intended as an aid in  the diagnosis of influenza from Nasopharyngeal swab specimens and  should not be used as a sole basis for treatment. Nasal washings and  aspirates are unacceptable for Xpert Xpress SARS-CoV-2/FLU/RSV  testing. Fact Sheet for Patients: PinkCheek.be Fact Sheet for Healthcare Providers: GravelBags.it This test is not yet approved or cleared by the [place] FDA and  has been authorized for detection and/or diagnosis of SARS-CoV-2 by  FDA under an Emergency Use Authorization (EUA). This EUA will remain  in effect (meaning this test can be used) for the duration of the  Covid-19 declaration under Section 564(b)(1) of the Act, 21  U.S.C. section 360bbb-3(b)(1), unless the authorization is  terminated or revoked. Performed at [facility], [address] [place], [place] [address]          Radiology Studies: No results found.      Scheduled Meds: . sodium chloride   Intravenous Once  . acetaminophen  650 mg Oral Once  . atorvastatin  20 mg Oral q1800  . enoxaparin (LOVENOX) injection  40 mg Subcutaneous Q12H  . hydrALAZINE  25 mg Oral TID  .  insulin aspart  0-9 Units Subcutaneous TID WC  . lactulose  30 g Oral Daily  . pantoprazole  40 mg Oral BID  . pseudoephedrine  30 mg Oral BID  . sodium chloride flush  3 mL Intravenous Once  . spironolactone  25 mg Oral Daily   Continuous Infusions: . albumin human Stopped (02/01/20 2232)    Assessment & Plan:   Principal Problem:   Volume overload Active Problems:   Cirrhosis (HCC)   Thrombocytopenia (HCC)   Chronic venous stasis dermatitis of both lower extremities   Hypertension associated with diabetes ([place])   Type 2 diabetes mellitus with diabetic nephropathy, with long-term current use of insulin (HCC)   CKD (chronic kidney disease) stage 4, GFR 15-29 ml/min (HCC)   Hyperlipidemia associated with type 2 diabetes mellitus (HCC)  Acute on chronic DHF- very volume overloaded. BNP elevated, but likely underestimated due to his weight. Improving clinically slowly Cards following Continue with lasix gtt Monitor labs    Hepatic cirrhosis with anasarca and visual hallucinations: Patient with significant volume overload in setting of cirrhosis, anasarca, acute on chronic diastolic heart failure.  Ultrasound scrotum shows significant scrotal edema with chronic right hydrocele.  He reports recent visual hallucinations but is otherwise alert and oriented at time of admission.  Ammonia is 70. -Continue Lasix drip -Monitor strict I/O's and daily weights -Continue lactulose, titrate to 2-3 bowel movements per day -Abdominal ultrasound with trace ascites -TTE on 12/16/2019 showed EF 60-65% with G1DD    CKD stage IV:  Nephrology following Continue spironolactone.   Fluid restriction.   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    Held home NovoLog 70/30 and place on sensitive SSI for now.  Adjust as needed.  Anemia/thrombocytopenia: Chronic and stable without obvious bleeding in setting of cirrhosis and iron deficiency.   Hemoglobin 6.8 today, will transfuse 1 unit today. Ck stool occult as he has hx/o GIB D/c lovenox ppx  Atrial fibrillation-continue with rate control and follow. Cardiology following, havediscussed chronic anticoagulation with the patient. Patient has esophageal varices which makes this fairly high risk. At this point will defer chronic anticoagulation due to his varices..   Hypertension: Continue home hydralazine and IV Lasix as above.  Chronic venous stasis and edema of  both lower extremities: Without obvious superimposed cellulitis.    Hyperlipidemia: Continue atorvastatin.  Anxiety: Continue as needed Ativan.  DVT prophylaxis: scd Code Status: Full code Family Communication: none at bedside Disposition Plan: From home, likely discharge to home pending diuresis and symptomatic improvement Barrier: very volume overloaded with anasarca and acute on diastolic heart failure needing Lasix drip.Need to monitor renal function.       LOS: 4 days   Time spent: 45 minutes with more than 50% on [place], MD [facility] Pager [phone]  If 7PM-7AM, please contact night-coverage [email or web address] Password [facility] 02/02/2020, 8:56 AM Patient ID: Corey West, male   DOB: 06-02-1951, 69 y.o.   MRN: [id number] Patient ID: Corey West, male   DOB: [birth year], 69 y.o.   MRN: [id number] Patient ID: Corey West, male   DOB: [birth year], 69 y.o.   MRN: [id number]

## 2020-02-02 NOTE — Progress Notes (Signed)
PT Cancellation Note  Patient Details Name: Corey West MRN: [id number] DOB: [birth year]   Cancelled Treatment:    Reason Eval/Treat Not Completed: Medical issues which prohibited therapy Pt with hemoglobin of 6.8, awaiting transfusion.  Offered to do in-bed exercises but pt not interested at this time, asks PT to come back tomorrow.  Kreg Shropshire, DPT 02/02/2020, 12:35 PM

## 2020-02-02 NOTE — Progress Notes (Signed)
Patient voiced concern regarding decreased dose of Lactulose; "...  A little while ago, I didn't know where I was and that usually means my blood is low or my Ammonia is too high.Marland KitchenMarland KitchenHave the Police call the Doctor..."  Informed patient that the Police would not call the doctor, that I would write a note for the doctor to see in the AM.  Unable to see any Ammonia level in "results". Barbaraann Faster, RN 12:25 AM 02/02/2020

## 2020-02-02 NOTE — Progress Notes (Signed)
   02/02/20 1600  Clinical Encounter Type  Visited With Patient  Visit Type Initial;Spiritual support;Social support  Referral From Nurse  Consult/Referral To Corey West visited with patient at the request of Nurse Corey West. Sherea said patientt was crying. Chaplain introduced herself and told patient that she was just checking on him. Patient said medical staff just finished working on him and he didn't feel well. Chaplain told him that she would come back to tomorrow. As chaplain prepared to leave, patient asked her to pray for him. Chaplain prayed and told patient that she would be back tomorrow to check on him.

## 2020-02-02 NOTE — TOC Initial Note (Signed)
Transition of Care [facility]) - Initial/Assessment Note    Patient Details  Name: Corey West MRN: [id number] Date of Birth: 1951/07/22  Transition of Care [facility]) CM/SW Contact:    Candie Chroman, LCSW Phone Number: 02/02/2020, 11:50 AM  Clinical Narrative: Readmission prevention screen complete. CSW met with patient. No supports at bedside. CSW introduced role and explained that discharge planning would be discussed. Patient from home alone. PCP is Dr. Ola Spurr at [facility]. No issues obtaining medications. Patient still drives. He is active with [facility] for nursing/wound care. He goes to [facility] on Mondays then [facility] on Wednesdays and Fridays. Patient has a cane and walker that he uses at home as needed. Notified patient of SNF recommendation. He understands potentially needing that at discharge but ultimately prefers to return home with therapy through [facility]. He has been to [facility] and [facility] in the past and reports negative experiences. Encouraged patient to notify CSW if he decides to pursue SNF. No further concerns. CSW will continue to follow patient for support and facilitate return home vs. SNF once medically stable.                 Expected Discharge Plan: Green Barriers to Discharge: Continued Medical Work up   Patient Goals and CMS Choice   CMS Medicare.gov Compare Post Acute Care list provided to:: Patient    Expected Discharge Plan and Services Expected Discharge Plan: [place] Acute Care Choice: Resumption of Svcs/PTA Provider Living arrangements for the past 2 months: Apartment(Condo)                                      Prior Living Arrangements/Services Living arrangements for the past 2 months: Apartment(Condo) Lives with:: Self Patient language and need for interpreter reviewed:: Yes Do you feel safe going back to the place where you  live?: Yes      Need for Family Participation in Patient Care: Yes (Comment) Care giver support system in place?: No (comment) Current home services: Home RN, DME Criminal Activity/Legal Involvement Pertinent to Current Situation/Hospitalization: No - Comment as needed  Activities of Daily Living Home Assistive Devices/Equipment: Cane (specify quad or straight), Walker (specify type)(straight cane FWW) ADL Screening (condition at time of admission) Patient's cognitive ability adequate to safely complete daily activities?: Yes Is the patient deaf or have difficulty hearing?: Yes Does the patient have difficulty seeing, even when wearing glasses/contacts?: No Does the patient have difficulty concentrating, remembering, or making decisions?: No Patient able to express need for assistance with ADLs?: Yes Does the patient have difficulty dressing or bathing?: No Independently performs ADLs?: Yes (appropriate for developmental age) Does the patient have difficulty walking or climbing stairs?: Yes Weakness of Legs: Both Weakness of Arms/Hands: None  Permission Sought/Granted Permission sought to share information with : Facility Art therapist granted to share information with : Yes, Verbal Permission Granted     Permission granted to share info w AGENCY: [facility]        Emotional Assessment Appearance:: Appears stated age Attitude/Demeanor/Rapport: Engaged, Gracious Affect (typically observed): Accepting, Appropriate, Calm, Pleasant Orientation: : Oriented to Self, Oriented to Place, Oriented to  Time, Oriented to Situation Alcohol / Substance Use: Not Applicable Psych Involvement: No (comment)  Admission diagnosis:  Orthopnea [  R06.01] Scrotal swelling [N50.89] Volume overload [E87.70] Increased ammonia level [R79.89] Patient Active Problem List   Diagnosis Date Noted  . Volume overload 01/28/2020  . Hyperlipidemia associated with type 2 diabetes  mellitus ([place]) 01/28/2020  . Liver cirrhosis ([place])   . Anxiety   . SOB (shortness of breath)   . Elevated troponin   . CKD (chronic kidney disease) stage 4, GFR 15-29 ml/min (HCC)   . Unstageable pressure ulcer of sacral region ([place]) 11/28/2019  . Chronic diastolic CHF (congestive heart failure) ([place]) 11/27/2019  . Type II diabetes mellitus with renal manifestations ([place]) 11/27/2019  . Cellulitis of left lower extremity 11/27/2019  . Cellulitis of lower extremity 11/27/2019  . HCAP (healthcare-associated pneumonia) 02/03/2019  . Aphasia 01/14/2019  . Acute on chronic renal failure ([place]) 01/14/2019  . Malnutrition of moderate degree 12/03/2018  . C. difficile diarrhea 12/02/2018  . Ankle fracture, left, closed, initial encounter 10/15/2018  . Ankle fracture, right, closed, initial encounter 10/15/2018  . Ankle fracture 10/15/2018  . Pressure injury of skin 07/19/2018  . UTI (urinary tract infection) 07/18/2018  . Acute renal failure superimposed on stage 3a chronic kidney disease ([place]) 01/02/2018  . 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 Goals of care, counseling/discussion   . Palliative care encounter   . Hepatic encephalopathy syndrome ([place]) 12/16/2017  . Hepatic encephalopathy ([place]) 10/18/2017  . Hypokalemia 10/09/2017  . Hyperglycemia 10/09/2017  . Symptomatic anemia 06/26/2017  . Sepsis ([place]) 06/12/2017  . Type 2 diabetes mellitus with diabetic nephropathy, with long-term current use of insulin ([place]) 05/29/2017  . Cellulitis in diabetic foot ([place]) 02/21/2017  . Cellulitis 02/21/2017  . GI bleed 07/20/2015  . Hematemesis 02/08/2015  . Cirrhosis ([place]) 02/08/2015  . Esophageal varices ([place]) 02/08/2015  . Gastroesophageal reflux disease 02/08/2015  . Hyperlipidemia 02/08/2015  . Chronic venous stasis dermatitis of both lower extremities 10/06/2014  . Edema 05/25/2014  . Hypertension associated with diabetes ([place]) 05/25/2014  . Obesity 05/25/2014  . Sleep  apnea 05/25/2014  . Barrett's esophagus 05/25/2014  . Pancytopenia ([place]) 05/25/2014  . Iron deficiency anemia 12/04/2013  . Thrombocytopenia ([place]) 12/04/2013  . Splenomegaly 11/04/2013   PCP:  System, Pcp Not In Pharmacy:   [facility], [place] [address] Phone: [phone] Fax: [phone]     Social Determinants of Health (SDOH) Interventions    Readmission Risk Interventions Readmission Risk Prevention Plan 02/02/2020 12/15/2019 02/03/2019  Transportation Screening Complete Complete Complete  PCP or Specialist Appt within 3-5 Days Complete - -  HRI or Home Care Consult Complete - -  Social Work Consult for [place] Planning/Counseling Complete - -  Palliative Care Screening Not Applicable - -  Medication Review Press photographer) Complete Complete Complete  PCP or Specialist appointment within 3-5 days of discharge - Complete -  [place] or [place] - Complete Complete  SW Recovery Care/Counseling Consult - - Not Complete  SW Consult Not Complete Comments - - NA  Palliative Care Screening - Not Applicable Not Applicable  Skilled Nursing Facility - Not Applicable Not Applicable  Some recent data might be hidden

## 2020-02-02 NOTE — Progress Notes (Signed)
[place], [place] 02/02/20  Subjective:   Hospital day # 4  Renal consult for volume overload and anasarca  continues to have large amount of LE edema and scrotal edema but feels like that it is improving with IV Lasix Patient was a little upset this morning that dose of lactulose was lowered because he was having too many stools.  He states that he is used to that. He felt confused overnight and thought it was due to lower dose of lactulose No SOB this morning    Objective:  Vital signs in last 24 hours:  Temp:  [98.6 F (37 C)-98.9 F (37.2 C)] 98.6 F (37 C) (05/04 0601) Pulse Rate:  [72-77] 72 (05/04 0601) Resp:  [20] 20 (05/04 0601) BP: (151-171)/(57-72) 151/72 (05/04 0601) SpO2:  [95 %-97 %] 96 % (05/04 0601) Weight:  [143.3 kg] 143.3 kg (05/04 0500)  Weight change: 2.8 kg Filed Weights   01/31/20 0500 02/01/20 0437 02/02/20 0500  Weight: (!) 142.8 kg (!) 140.5 kg (!) 143.3 kg    Intake/Output:    Intake/Output Summary (Last 24 hours) at 02/02/2020 1122 Last data filed at 02/02/2020 0900 Gross per 24 hour  Intake 599.52 ml  Output 950 ml  Net -350.48 ml     Physical Exam: General: NAD, laying in bed,   HEENT Anicteric   Pulm/lungs Coarse breath sounds, room air  CVS/Heart regular  Abdomen:  Soft, NT  Extremities: ++ edema b.l,  Neurologic: Alert, oriented  Skin: Stasis dermatitis      External urinary catheter    Basic Metabolic Panel:  Recent Labs  Lab 01/29/20 0539 01/29/20 0539 01/30/20 0629 01/30/20 0629 01/31/20 0628 02/01/20 0504 02/02/20 0535  NA 143  --  143  --  143 141 140  K 4.3  --  4.1  --  3.4* 3.7 3.5  CL 111  --  111  --  112* 108 107  CO2 23  --  24  --  24 25 25   GLUCOSE 124*  --  163*  --  169* 148* 150*  BUN 41*  --  41*  --  38* 37* 44*  CREATININE 2.46*  --  2.57*  --  2.51* 2.43* 2.73*  CALCIUM 9.1   < > 9.2   < > 9.3 9.4 9.3  MG  --   --  2.0  --   --   --   --   PHOS  --   --  3.2   --   --   --   --    < > = values in this interval not displayed.     CBC: Recent Labs  Lab 01/28/20 1605 01/29/20 0539 02/02/20 0535  WBC 4.7 4.3 7.1  HGB 7.8* 7.6* 6.8*  HCT 25.6* 24.1* 22.1*  MCV 94.8 92.7 93.6  PLT 122* 114* 96*      Lab Results  Component Value Date   HEPBSAG NON REACTIVE 01/30/2020   HEPBSAB NON REACTIVE 01/30/2020   HEPBIGM NON REACTIVE 01/30/2020      Microbiology:  Recent Results (from the past 240 hour(s))  Respiratory Panel by RT PCR (Flu A&B, Covid) - Nasopharyngeal Swab     Status: None   Collection Time: 01/28/20 10:34 PM   Specimen: Nasopharyngeal Swab  Result Value Ref Range Status   SARS Coronavirus 2 by RT PCR NEGATIVE NEGATIVE Final    Comment: (NOTE) SARS-CoV-2 target nucleic acids are NOT DETECTED. The SARS-CoV-2 RNA is  generally detectable in upper respiratoy specimens during the acute phase of infection. The lowest concentration of SARS-CoV-2 viral copies this assay can detect is 131 copies/mL. A negative result does not preclude SARS-Cov-2 infection and should not be used as the sole basis for treatment or other patient management decisions. A negative result may occur with  improper specimen collection/handling, submission of specimen other than nasopharyngeal swab, presence of viral mutation(s) within the areas targeted by this assay, and inadequate number of viral copies (<131 copies/mL). A negative result must be combined with clinical observations, patient history, and epidemiological information. The expected result is Negative. Fact Sheet for Patients:  PinkCheek.be Fact Sheet for Healthcare Providers:  GravelBags.it This test is not yet ap proved or cleared by the [place] FDA and  has been authorized for detection and/or diagnosis of SARS-CoV-2 by FDA under an Emergency Use Authorization (EUA). This EUA will remain  in effect (meaning this test can  be used) for the duration of the COVID-19 declaration under Section 564(b)(1) of the Act, 21 U.S.C. section 360bbb-3(b)(1), unless the authorization is terminated or revoked sooner.    Influenza A by PCR NEGATIVE NEGATIVE Final   Influenza B by PCR NEGATIVE NEGATIVE Final    Comment: (NOTE) The Xpert Xpress SARS-CoV-2/FLU/RSV assay is intended as an aid in  the diagnosis of influenza from Nasopharyngeal swab specimens and  should not be used as a sole basis for treatment. Nasal washings and  aspirates are unacceptable for Xpert Xpress SARS-CoV-2/FLU/RSV  testing. Fact Sheet for Patients: PinkCheek.be Fact Sheet for Healthcare Providers: GravelBags.it This test is not yet approved or cleared by the [place] FDA and  has been authorized for detection and/or diagnosis of SARS-CoV-2 by  FDA under an Emergency Use Authorization (EUA). This EUA will remain  in effect (meaning this test can be used) for the duration of the  Covid-19 declaration under Section 564(b)(1) of the Act, 21  U.S.C. section 360bbb-3(b)(1), unless the authorization is  terminated or revoked. Performed at [facility], [address] [place], [place] [address]     Coagulation Studies: No results for input(s): LABPROT, INR in the last 72 hours.  Urinalysis: No results for input(s): COLORURINE, LABSPEC, PHURINE, GLUCOSEU, HGBUR, BILIRUBINUR, KETONESUR, PROTEINUR, UROBILINOGEN, NITRITE, LEUKOCYTESUR in the last 72 hours.  Invalid input(s): APPERANCEUR    Imaging: No results found.   Medications:   . albumin human 25 g (02/02/20 1012)   . sodium chloride   Intravenous Once  . acetaminophen  650 mg Oral Once  . atorvastatin  20 mg Oral q1800  . hydrALAZINE  25 mg Oral TID  . insulin aspart  0-9 Units Subcutaneous TID WC  . lactulose  30 g Oral TID  . pantoprazole  40 mg Oral BID  . pseudoephedrine  30 mg Oral BID  . sodium  chloride flush  3 mL Intravenous Once  . 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  Not currently on ACE-I/ARB at home.  Chronic kidney disease secondary to  diabetes, hypertension.  Acute renal failure secondary to acute cardiorenal syndrome 05/03 0701 - 05/04 0700 In: 719.5 [P.O.:360; I.V.:175.2; IV Piggyback:184.3] Out: 950 [Urine:600; Stool:350]  Lab Results  Component Value Date   CREATININE 2.73 (H) 02/02/2020   CREATININE 2.43 (H) 02/01/2020   CREATININE 2.51 (H) 01/31/2020    #Cirrhosis with ascites and volume overload   #Anemia of chronic kidney disease and history of GI bleed. Thrombocytopenia.  Lab Results  Component Value Date   HGB 6.8 (L) 02/02/2020    # Diabetes mellitus type II with chronic kidney disease: insulin dependent. Lab Results  Component Value Date   HGBA1C 6.6 (H) 12/15/2019    Plan - continue furosemide drip - Continue spironolactone - fluid restriction.  - IV albumin.  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  We will monitor carefully during hospitalization -Avoid hypotension -Patient is to get blood  transfusion today    LOS: [place] 5/4/202111:22 AM  [place], [place]  Note: This note was prepared with Dragon dictation. Any transcription errors are unintentional

## 2020-02-02 NOTE — NC FL2 (Signed)
[place] LEVEL OF CARE SCREENING TOOL     IDENTIFICATION  Patient Name: Corey West Birthdate: 07/27/1951 Sex: male Admission Date (Current Location): 01/28/2020  [place] and [place] Number:  Engineering geologist and Address:  [facility], [address], [place], [place] [address]      Provider Number: [id number]  Attending Physician Name and Address:  Nolberto Hanlon, MD  Relative Name and Phone Number:       Current Level of Care: Hospital Recommended Level of Care: [place] Prior Approval Number:    Date Approved/Denied:   PASRR Number: [id number]  Discharge Plan: SNF    Current Diagnoses: Patient Active Problem List   Diagnosis Date Noted  . Volume overload 01/28/2020  . Hyperlipidemia associated with type 2 diabetes mellitus ([place]) 01/28/2020  . Liver cirrhosis ([place])   . Anxiety   . SOB (shortness of breath)   . Elevated troponin   . CKD (chronic kidney disease) stage 4, GFR 15-29 ml/min (HCC)   . Unstageable pressure ulcer of sacral region ([place]) 11/28/2019  . Chronic diastolic CHF (congestive heart failure) ([place]) 11/27/2019  . Type II diabetes mellitus with renal manifestations ([place]) 11/27/2019  . Cellulitis of left lower extremity 11/27/2019  . Cellulitis of lower extremity 11/27/2019  . HCAP (healthcare-associated pneumonia) 02/03/2019  . Aphasia 01/14/2019  . Acute on chronic renal failure ([place]) 01/14/2019  . Malnutrition of moderate degree 12/03/2018  . C. difficile diarrhea 12/02/2018  . Ankle fracture, left, closed, initial encounter 10/15/2018  . Ankle fracture, right, closed, initial encounter 10/15/2018  . Ankle fracture 10/15/2018  . Pressure injury of skin 07/19/2018  . UTI (urinary tract infection) 07/18/2018  . Acute renal failure superimposed on stage 3a chronic kidney disease ([place]) 01/02/2018  . 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 Goals of care, counseling/discussion    . Palliative care encounter   . Hepatic encephalopathy syndrome ([place]) 12/16/2017  . Hepatic encephalopathy ([name]) 10/18/2017  . Hypokalemia 10/09/2017  . Hyperglycemia 10/09/2017  . Symptomatic anemia 06/26/2017  . Sepsis ([place]) 06/12/2017  . Type 2 diabetes mellitus with diabetic nephropathy, with long-term current use of insulin ([place]) 05/29/2017  . Cellulitis in diabetic foot ([place]) 02/21/2017  . Cellulitis 02/21/2017  . GI bleed 07/20/2015  . Hematemesis 02/08/2015  . Cirrhosis ([place]) 02/08/2015  . Esophageal varices ([place]) 02/08/2015  . Gastroesophageal reflux disease 02/08/2015  . Hyperlipidemia 02/08/2015  . Chronic venous stasis dermatitis of both lower extremities 10/06/2014  . Edema 05/25/2014  . Hypertension associated with diabetes ([place]) 05/25/2014  . Obesity 05/25/2014  . Sleep apnea 05/25/2014  . Barrett's esophagus 05/25/2014  . Pancytopenia ([place]) 05/25/2014  . Iron deficiency anemia 12/04/2013  . Thrombocytopenia ([place]) 12/04/2013  . Splenomegaly 11/04/2013    Orientation RESPIRATION BLADDER Height & Weight     Self, Time, Situation, Place  Normal Incontinent, External catheter Weight: (!) 315 lb 14.7 oz (143.3 kg) Height:  5\' 9"  (175.3 cm)  BEHAVIORAL SYMPTOMS/MOOD NEUROLOGICAL BOWEL NUTRITION STATUS  (None) (None) Incontinent(Rectal tube with balloon. Should be removed prior to discharge.) Diet(Carb modified. Fluid restriction: 1500 mL.)  AMBULATORY STATUS COMMUNICATION OF NEEDS Skin   Limited Assist Verbally Other (Comment)(Blister, weeping.)                       Personal Care Assistance Level of Assistance  Bathing, Feeding, Dressing Bathing Assistance: Limited assistance Feeding assistance: Independent Dressing Assistance: Limited assistance     Functional  Limitations Info  Sight, Hearing, Speech Sight Info: Adequate Hearing Info: Adequate Speech Info: Adequate    SPECIAL CARE FACTORS FREQUENCY  PT (By licensed PT), OT (By licensed OT)      PT Frequency: 5 x week OT Frequency: 5 x week            Contractures Contractures Info: Not present    Additional Factors Info  Code Status, Allergies Code Status Info: Full code Allergies Info: NKDA           Current Medications (02/02/2020):  This is the current hospital active medication list Current Facility-Administered Medications  Medication Dose Route Frequency Provider Last Rate Last Admin  . acetaminophen (TYLENOL) tablet 650 mg  650 mg Oral Once Nolberto Hanlon, MD      . albumin human 25 % solution 25 g  25 g Intravenous BID Murlean Iba, MD 60 mL/hr at 02/02/20 1012 25 g at 02/02/20 1012  . atorvastatin (LIPITOR) tablet 20 mg  20 mg Oral q1800 Lenore Cordia, MD   20 mg at 02/01/20 1734  . furosemide (LASIX) 250 mg in dextrose 5 % 250 mL (1 mg/mL) infusion  8 mg/hr Intravenous Continuous Singh, Harmeet, MD      . hydrALAZINE (APRESOLINE) tablet 25 mg  25 mg Oral TID Lenore Cordia, MD   25 mg at 02/02/20 0956  . insulin aspart (novoLOG) injection 0-9 Units  0-9 Units Subcutaneous TID WC Lenore Cordia, MD   2 Units at 02/02/20 1238  . lactulose (CHRONULAC) 10 GM/15ML solution 30 g  30 g Oral TID Nolberto Hanlon, MD      . LORazepam (ATIVAN) tablet 0.5 mg  0.5 mg Oral Q8H PRN Zada Finders R, MD   0.5 mg at 02/02/20 1333  . ondansetron (ZOFRAN) tablet 4 mg  4 mg Oral Q6H PRN Lenore Cordia, MD       Or  . ondansetron (ZOFRAN) injection 4 mg  4 mg Intravenous Q6H PRN Zada Finders R, MD      . pantoprazole (PROTONIX) EC tablet 40 mg  40 mg Oral BID Lenore Cordia, MD   40 mg at 02/02/20 0956  . pseudoephedrine (SUDAFED) tablet 30 mg  30 mg Oral BID Murlean Iba, MD   30 mg at 02/02/20 0956  . sodium chloride (OCEAN) 0.65 % nasal spray 1 spray  1 spray Each Nare PRN Nolberto Hanlon, MD   1 spray at 01/30/20 0254  . sodium chloride flush (NS) 0.9 % injection 3 mL  3 mL Intravenous Once Lilia Pro., MD      . spironolactone (ALDACTONE) tablet 25 mg  25 mg Oral Daily  Kolluru, Sarath, MD   25 mg at 02/02/20 0956  . traMADol (ULTRAM) tablet 50 mg  50 mg Oral Q6H PRN Lenore Cordia, MD   50 mg at 02/01/20 0831     Discharge Medications: Please see discharge summary for a list of discharge medications.  Relevant Imaging Results:  Relevant Lab Results:   Additional Information SS#: [id number]  Candie Chroman, LCSW

## 2020-02-03 ENCOUNTER — Inpatient Hospital Stay: Payer: Medicare Other

## 2020-02-03 DIAGNOSIS — J9601 Acute respiratory failure with hypoxia: Secondary | ICD-10-CM

## 2020-02-03 LAB — GLUCOSE, CAPILLARY
Glucose-Capillary: 131 mg/dL — ABNORMAL HIGH (ref 70–99)
Glucose-Capillary: 150 mg/dL — ABNORMAL HIGH (ref 70–99)
Glucose-Capillary: 132 mg/dL — ABNORMAL HIGH (ref 70–99)
Glucose-Capillary: 155 mg/dL — ABNORMAL HIGH (ref 70–99)
Glucose-Capillary: 199 mg/dL — ABNORMAL HIGH (ref 70–99)
Glucose-Capillary: 213 mg/dL — ABNORMAL HIGH (ref 70–99)

## 2020-02-03 LAB — CBC
HCT: 25.2 % — ABNORMAL LOW (ref 39.0–52.0)
Hemoglobin: 7.8 g/dL — ABNORMAL LOW (ref 13.0–17.0)
MCH: 29 pg (ref 26.0–34.0)
MCHC: 31 g/dL (ref 30.0–36.0)
MCV: 93.7 fL (ref 80.0–100.0)
Platelets: 108 10*3/uL — ABNORMAL LOW (ref 150–400)
RBC: 2.69 MIL/uL — ABNORMAL LOW (ref 4.22–5.81)
RDW: 14.9 % (ref 11.5–15.5)
WBC: 9.3 10*3/uL (ref 4.0–10.5)
nRBC: 0 % (ref 0.0–0.2)

## 2020-02-03 LAB — BASIC METABOLIC PANEL
Anion gap: 11 (ref 5–15)
BUN: 48 mg/dL — ABNORMAL HIGH (ref 8–23)
CO2: 23 mmol/L (ref 22–32)
Calcium: 9.2 mg/dL (ref 8.9–10.3)
Chloride: 106 mmol/L (ref 98–111)
Creatinine, Ser: 2.63 mg/dL — ABNORMAL HIGH (ref 0.61–1.24)
GFR calc Af Amer: 28 mL/min — ABNORMAL LOW (ref >60)
GFR calc non Af Amer: 24 mL/min — ABNORMAL LOW (ref >60)
Glucose, Bld: 147 mg/dL — ABNORMAL HIGH (ref 70–99)
Potassium: 3.4 mmol/L — ABNORMAL LOW (ref 3.5–5.1)
Sodium: 140 mmol/L (ref 135–145)

## 2020-02-03 LAB — BLOOD GAS, ARTERIAL
Acid-base deficit: 1.6 mmol/L (ref 0.0–2.0)
Bicarbonate: 21.7 mmol/L (ref 20.0–28.0)
FIO2: 0.32
O2 Saturation: 96.3 %
Patient temperature: 37
pCO2 arterial: 32 mmHg (ref 32.0–48.0)
pH, Arterial: 7.44 (ref 7.350–7.450)
pO2, Arterial: 81 mmHg — ABNORMAL LOW (ref 83.0–108.0)

## 2020-02-03 MED ORDER — MORPHINE SULFATE (PF) 2 MG/ML IV SOLN
2.0000 mg | Freq: Once | INTRAVENOUS | Status: AC
  Administered 2020-02-03: 2 mg via INTRAVENOUS
  Filled 2020-02-03: qty 1

## 2020-02-03 MED ORDER — IPRATROPIUM BROMIDE 0.02 % IN SOLN
0.5000 mg | Freq: Four times a day (QID) | RESPIRATORY_TRACT | Status: DC | PRN
  Administered 2020-02-03 – 2020-02-12 (×2): 0.5 mg via RESPIRATORY_TRACT
  Filled 2020-02-03 (×2): qty 2.5

## 2020-02-03 MED ORDER — LEVALBUTEROL HCL 0.63 MG/3ML IN NEBU
0.6300 mg | INHALATION_SOLUTION | Freq: Three times a day (TID) | RESPIRATORY_TRACT | Status: DC | PRN
  Filled 2020-02-03: qty 3

## 2020-02-03 MED ORDER — POLYSACCHARIDE IRON COMPLEX 150 MG PO CAPS
150.0000 mg | ORAL_CAPSULE | Freq: Every day | ORAL | Status: DC
  Administered 2020-02-03 – 2020-02-12 (×10): 150 mg via ORAL
  Filled 2020-02-03 (×11): qty 1

## 2020-02-03 MED ORDER — POTASSIUM CHLORIDE 20 MEQ PO PACK
40.0000 meq | PACK | Freq: Once | ORAL | Status: AC
  Administered 2020-02-03: 40 meq via ORAL
  Filled 2020-02-03: qty 2

## 2020-02-03 NOTE — Progress Notes (Signed)
Patient continues to sleep and has a resp rate of 28-30. Appears to be a mouth breather. He may benefit from an oxymask at this point. RT called to assess.

## 2020-02-03 NOTE — Progress Notes (Signed)
Patient continues to be on BIPAP and does not like the mask. Encouraged to keep on. Now having c/o right lower leg pain. Bilateral legs continue to be very edematous and warm to touch up into the thighs.

## 2020-02-03 NOTE — Evaluation (Signed)
Clinical/Bedside Swallow Evaluation Patient Details  Name: Corey West MRN: [id number] Date of Birth: [birth year]  Today's Date: 02/03/2020 Time: SLP Start Time (ACUTE ONLY): 1135 SLP Stop Time (ACUTE ONLY): 1205 SLP Time Calculation (min) (ACUTE ONLY): 30 min  Past Medical History:  Past Medical History:  Diagnosis Date  . Anemia   . Anxiety    controlled;   . Arthritis   . AVM (arteriovenous malformation) of stomach, acquired with hemorrhage   . Barrett's esophagus   . Chronic kidney disease    renal infufficiency  . Cirrhosis ([place])   . Depression    controlled;   Marland Kitchen Diabetes mellitus without complication ([place])    not controlled, taking insulin but sugar continues to run high;   . Edema   . Esophageal varices ([place])   . GAVE (gastric antral vascular ectasia)   . GERD (gastroesophageal reflux disease)   . History of hiatal hernia   . Hyperlipidemia   . Hypertension    controlled well;   . Liver cirrhosis ([place])   . Nephropathy, diabetic ([place])   . Obesity   . Pancytopenia ([place])   . Polyp, stomach    with chronic blood loss  . Sleep apnea    does not wear a cpap, Medicare would not pay for it   . Venous stasis dermatitis of both lower extremities   . Venous stasis of both lower extremities    with cellulitis   Past Surgical History:  Past Surgical History:  Procedure Laterality Date  . ESOPHAGOGASTRODUODENOSCOPY N/A 02/09/2015   Procedure: ESOPHAGOGASTRODUODENOSCOPY (EGD);  Surgeon: Manya Silvas, MD;  Location: [facility] ENDOSCOPY;  Service: Endoscopy;  Laterality: N/A;  . ESOPHAGOGASTRODUODENOSCOPY N/A 07/22/2015   Procedure: ESOPHAGOGASTRODUODENOSCOPY (EGD);  Surgeon: Manya Silvas, MD;  Location: [facility] ENDOSCOPY;  Service: Endoscopy;  Laterality: N/A;  . ESOPHAGOGASTRODUODENOSCOPY N/A 06/26/2017   Procedure: ESOPHAGOGASTRODUODENOSCOPY (EGD);  Surgeon: Manya Silvas, MD;  Location: [facility] ENDOSCOPY;  Service: Endoscopy;  Laterality: N/A;  . ESOPHAGOGASTRODUODENOSCOPY  N/A 06/27/2017   Procedure: ESOPHAGOGASTRODUODENOSCOPY (EGD);  Surgeon: Manya Silvas, MD;  Location: [facility] ENDOSCOPY;  Service: Endoscopy;  Laterality: N/A;  . ESOPHAGOGASTRODUODENOSCOPY N/A 06/28/2017   Procedure: ESOPHAGOGASTRODUODENOSCOPY (EGD);  Surgeon: Manya Silvas, MD;  Location: [facility] ENDOSCOPY;  Service: Endoscopy;  Laterality: N/A;  . ESOPHAGOGASTRODUODENOSCOPY Bilateral 10/11/2017   Procedure: ESOPHAGOGASTRODUODENOSCOPY (EGD);  Surgeon: Manya Silvas, MD;  Location: [facility] ENDOSCOPY;  Service: Endoscopy;  Laterality: Bilateral;  . ESOPHAGOGASTRODUODENOSCOPY N/A 12/04/2017   Procedure: ESOPHAGOGASTRODUODENOSCOPY (EGD);  Surgeon: Manya Silvas, MD;  Location[facility] ENDOSCOPY;  Service: Endoscopy;  Laterality: N/A;  . ESOPHAGOGASTRODUODENOSCOPY N/A 03/06/2019   Procedure: ESOPHAGOGASTRODUODENOSCOPY (EGD);  Surgeon: Toledo, Benay Pike, MD;  Location: [facility] ENDOSCOPY;  Service: Gastroenterology;  Laterality: N/A;  . ESOPHAGOGASTRODUODENOSCOPY (EGD) WITH PROPOFOL N/A 07/20/2015   Procedure: ESOPHAGOGASTRODUODENOSCOPY (EGD) WITH PROPOFOL;  Surgeon: Manya Silvas, MD;  Location: [facility] ENDOSCOPY;  Service: Endoscopy;  Laterality: N/A;  . ESOPHAGOGASTRODUODENOSCOPY (EGD) WITH PROPOFOL N/A 09/16/2015   Procedure: ESOPHAGOGASTRODUODENOSCOPY (EGD) WITH PROPOFOL;  Surgeon: Manya Silvas, MD;  Location: [facility] ENDOSCOPY;  Service: Endoscopy;  Laterality: N/A;  . ESOPHAGOGASTRODUODENOSCOPY (EGD) WITH PROPOFOL N/A 03/16/2016   Procedure: ESOPHAGOGASTRODUODENOSCOPY (EGD) WITH PROPOFOL;  Surgeon: Manya Silvas, MD;  Location: [facility] ENDOSCOPY;  Service: Endoscopy;  Laterality: N/A;  . ESOPHAGOGASTRODUODENOSCOPY (EGD) WITH PROPOFOL N/A 09/14/2016   Procedure: ESOPHAGOGASTRODUODENOSCOPY (EGD) WITH PROPOFOL;  Surgeon: Manya Silvas, MD;  Location: [facility] ENDOSCOPY;  Service: Endoscopy;  Laterality: N/A;  . ESOPHAGOGASTRODUODENOSCOPY (EGD) WITH PROPOFOL N/A  11/05/2016   Procedure:  ESOPHAGOGASTRODUODENOSCOPY (EGD) WITH PROPOFOL;  Surgeon: Manya Silvas, MD;  Location: [facility] ENDOSCOPY;  Service: Endoscopy;  Laterality: N/A;  . ESOPHAGOGASTRODUODENOSCOPY (EGD) WITH PROPOFOL N/A 02/06/2017   Procedure: ESOPHAGOGASTRODUODENOSCOPY (EGD) WITH PROPOFOL;  Surgeon: Manya Silvas, MD;  Location: [facility] ENDOSCOPY;  Service: Endoscopy;  Laterality: N/A;  . ESOPHAGOGASTRODUODENOSCOPY (EGD) WITH PROPOFOL N/A 04/24/2017   Procedure: ESOPHAGOGASTRODUODENOSCOPY (EGD) WITH PROPOFOL;  Surgeon: Manya Silvas, MD;  Location: [facility] ENDOSCOPY;  Service: Endoscopy;  Laterality: N/A;  . ESOPHAGOGASTRODUODENOSCOPY (EGD) WITH PROPOFOL N/A 07/31/2017   Procedure: ESOPHAGOGASTRODUODENOSCOPY (EGD) WITH PROPOFOL;  Surgeon: Manya Silvas, MD;  Location: [facility] ENDOSCOPY;  Service: Endoscopy;  Laterality: N/A;  . ESOPHAGOGASTRODUODENOSCOPY (EGD) WITH PROPOFOL N/A 08/08/2018   Procedure: ESOPHAGOGASTRODUODENOSCOPY (EGD) WITH PROPOFOL;  Surgeon: Manya Silvas, MD;  Location: [facility] ENDOSCOPY;  Service: Endoscopy;  Laterality: N/A;  . GIVENS CAPSULE STUDY N/A 12/05/2017   Procedure: GIVENS CAPSULE STUDY;  Surgeon: Manya Silvas, MD;  Location: [facility] ENDOSCOPY;  Service: Endoscopy;  Laterality: N/A;  . TONSILLECTOMY    . TONSILLECTOMY AND ADENOIDECTOMY    . ULNAR NERVE TRANSPOSITION    . UVULOPALATOPHARYNGOPLASTY     HPI:  Pt. is a 69 y.o. male who was admitted to [facility] with LE edema, and scrotal pain. Pt. PMHx includes: Hepatic Cirrhosis, with espohageal Varices, portal HTN, gastropathy, IDDM Type 2, and CKD Stage 4, HTN, Hyperlipidemia, Chronic Venous status dermatitis, and Anxiety.   Assessment / Plan / Recommendation Clinical Impression  Pt presents at reduced risk of aspiration when following general aspiration precautions when consuming regular diet with thin liquids via cup or straw. During this evaluation pt consumed regular textures (graham crackers with peanut butter) and 6 oz thin liquids  via straw with no overt s/s of dysphagia or aspiration.  Pt was on 3L O2 via nasal cannula.  While talking (conversation length) and when consuming POs, pt's WOB and RR didn't increase. Additionally, he didn't present with s/s of esophageal dysfunction at bedside. Education provided on remaining upright after PO intake. Pt most recent chest x-ray today reported no edema, consolidation, effusion, or pneumothorax. At this time, dysphagia therapy is not indicated. Please re-consult ST if indicated.  SLP Visit Diagnosis: Dysphagia, unspecified (R13.10)    Aspiration Risk  Mild aspiration risk    Diet Recommendation   Age appropriate regular with thin liquids  Medication Administration: Whole meds with liquid    Other  Recommendations Oral Care Recommendations: Oral care BID   Follow up Recommendations None      Frequency and Duration   N/A         Prognosis   N/A     Swallow Study   General Date of Onset: 02/03/20 HPI: Pt. is a 69 y.o. male who was admitted to [facility] with LE edema, and scrotal pain. Pt. PMHx includes: Hepatic Cirrhosis, with espohageal Varices, portal HTN, gastropathy, IDDM Type 2, and CKD Stage 4, HTN, Hyperlipidemia, Chronic Venous status dermatitis, and Anxiety. Type of Study: Bedside Swallow Evaluation Previous Swallow Assessment: none in chart Diet Prior to this Study: Regular;Thin liquids Temperature Spikes Noted: No Respiratory Status: Nasal cannula(recently taken off BiPAP) History of Recent Intubation: No Behavior/Cognition: Alert;Cooperative;Pleasant mood Oral Cavity Assessment: Within Functional Limits Oral Care Completed by SLP: No Oral Cavity - Dentition: Adequate natural dentition Vision: Functional for self-feeding Self-Feeding Abilities: Able to feed self Patient Positioning: Upright in bed Baseline Vocal Quality: Normal Volitional Cough: Strong Volitional Swallow: Able to elicit  Oral/Motor/Sensory Function Overall Oral Motor/Sensory Function:  Within functional limits   Ice Chips Ice chips: Not tested   Thin Liquid Thin Liquid: Within functional limits Presentation: Self Fed;Straw;Cup    Nectar Thick Nectar Thick Liquid: Not tested   Honey Thick Honey Thick Liquid: Not tested   Puree Puree: Within functional limits Presentation: Self Fed;Spoon   Solid    Lynise Porr B. Rutherford Nail M.S., CCC-SLP, CBIS Speech-Language Pathologist Rehabilitation Services Office [phone]  Solid: Within functional limits Presentation: Self Fed Other Comments: graham crackers with peanut butter      Ginevra Tacker 02/03/2020,12:31 PM

## 2020-02-03 NOTE — Progress Notes (Signed)
Per Dr. Roosevelt Locks keep patient NPO. He will see him soon.   Fuller Mandril, RN

## 2020-02-03 NOTE — Progress Notes (Signed)
    BRIEF OVERNIGHT PROGRESS REPORT   SUBJECTIVE: Patient with increased work of breathing, SOB, tachypnea and tachycardic this shift.  OBJECTIVE: Patient assessed at the bedside previously, he is currently afebrile with blood pressure 148/61 mm Hg, RR 33 and pulse rate 103 beats/min. There were no focal neurological deficits; he was alert and oriented x4, and he did demonstrate increased work of breathing and use of accessory muscle.   BRIEF PATIENT DESCRIPTION: 69 y.o.malewith medical history significant forhepatic cirrhosis with esophageal varices and portal hypertension gastropathy, insulin-dependent type 2 diabetes, CKD stage IV, hypertension, hyperlipidemia, chronic venous stasis dermatitis and edema bilateral lower extremities, anxiety, and obesity who presents to the ED for evaluation of progressive volume overload.  ASSESSMENT/PLAN: Acute respiratory failure secondary toAcute exacerbation of diastolic congestive heart failure and complicated by Acute renal failure on chronic kidney disease stage IV with proteinuria    -Patient with significant volume overload in setting of cirrhosis, anasarca, acute on chronic diastolic heart failure - Transfer to progressive care unit once bed becomes available - Place BiPAP - Continue Lasix gtt - Monitor Electrolytes - Nephrology following may need HD    Rufina Falco, DNP, [place], [facility] Nurse Practitioner  [facility]  Office  [phone]

## 2020-02-03 NOTE — Progress Notes (Signed)
Patient transferred to room 258.   Fuller Mandril, RN

## 2020-02-03 NOTE — Progress Notes (Signed)
Received report from [facility]. Patient transferred to rm 258. Patient placed back on Bipap due to SOB and RR increasing. MD aware.

## 2020-02-03 NOTE — Progress Notes (Signed)
Mews score yellow. Patient heart rate 89 at the moment with a Resp rate of 27. Provider aware will continue to monitor. Patient currently sleeping. Will continue to monitor.

## 2020-02-03 NOTE — Progress Notes (Signed)
Per RN, patient had more than once removed bipap, RN placed patient on 3l [place] until RT arrive at bedside. Patient drinking water and communicating with caregivers. Saturations were 99% on  3L [employer]. Patient was being packed up to move to 258. Since patient tolerating off of bipap and moving soon, bipap take to 258 and setup for use if needed.

## 2020-02-03 NOTE — Progress Notes (Signed)
ABG collected at this point and awaiting results. Provider aware and would like RT to contact her when done. Patient continues to have lasix gtt infusing at 8cc/hr and continues to have a resp of 34. Nursing at the bedside and will continue to monitor.

## 2020-02-03 NOTE — Progress Notes (Signed)
RT called at this point. STAT ABG has been ordered at this time. States he will contact someone to preform an ABG.

## 2020-02-03 NOTE — Progress Notes (Signed)
Patient removed bipap. Patient requesting to have something to drink. Per Dr. Candiss Norse while rounding on the patient ok to give a few sips to see how he does. Patient was able to tolerate oral liquids with no issues. Patient observed WOB to have slightly decreased. Placed patient on [place] 3L and continuous pulse ox. Will remain with patient.   Fuller Mandril, RN

## 2020-02-03 NOTE — Progress Notes (Signed)
Called to patients room with c/o him feeling shaky. Upon entering patient was shaky and states he goes from being cold to hot. Patient vitals check with normal temp however Afib rate from the 90-120's. Blood sugar also stable at 131. Patient also has expiatory wheezes/ and lung sounds heard diminished in bases. Bladder scan completed with 270cc found.  Provider contacted at this time as this nurse did have some concerns. Orders to give neb treatment as well as morphine, I also gave patient anxiety medication. Will continue to monitor.

## 2020-02-03 NOTE — Progress Notes (Signed)
Patient breathing pattern seems to have slowed as well as the shaking. HR Afib 80-100. Will continue to monitor.

## 2020-02-03 NOTE — Progress Notes (Addendum)
Patient on bipap. Awaiting transfer to 2A once bed becomes available. Patient given few sips of water. Patient observed to have choked some. Will hold oral intake at this time. Contacted MD.   Fuller Mandril, RN

## 2020-02-03 NOTE — Progress Notes (Signed)
   02/03/20 1400  Clinical Encounter Type  Visited With Patient  Visit Type Follow-up  Referral From Chaplain  Consult/Referral To Chaplain  Chaplain stopped by room 215 and was told patient was moved to 258. When chaplain reached 258, she was told patient was haing a CT scan done. By the time chaplain reached patient this afternoon, he was not up to a visit. Patient said he had a rough night last night, but feels better now.  Of course chaplain will be praying for patient.

## 2020-02-03 NOTE — Plan of Care (Signed)
Patient on bipap. Continue to monitor closely. MD alerted.

## 2020-02-03 NOTE — Progress Notes (Signed)
Corey West, [place] 02/03/20  Subjective:   Hospital day # 5  Renal consult for volume overload and anasarca  continues to have large amount of LE edema and scrotal edema but feels like that it is improving with IV Lasix  Yesterday patient had some respiratory difficulties after blood transfusion Had irregular heartbeat, atrial fibrillation He thinks he may have choked on a cup of water.  He was also given medications for anxiety Urine output documented at 600 cc.  Serum creatinine slightly improved today to 2.6 from 2.7   Objective:  Vital signs in last 24 hours:  Temp:  [98 F (36.7 C)-100.2 F (37.9 C)] 100.2 F (37.9 C) (05/05 1031) Pulse Rate:  [71-100] 81 (05/05 1031) Resp:  [18-31] 27 (05/05 1035) BP: (102-162)/(51-82) 102/82 (05/05 1031) SpO2:  [95 %-100 %] 100 % (05/05 1031)  Weight change:  Filed Weights   01/31/20 0500 02/01/20 0437 02/02/20 0500  Weight: (!) 142.8 kg (!) 140.5 kg (!) 143.3 kg    Intake/Output:    Intake/Output Summary (Last 24 hours) at 02/03/2020 1229 Last data filed at 02/03/2020 0950 Gross per 24 hour  Intake 1010.84 ml  Output --  Net 1010.84 ml     Physical Exam: General: NAD, laying in bed,   HEENT Anicteric   Pulm/lungs Coarse breath sounds, room air  CVS/Heart irregular  Abdomen:  Soft, NT  Extremities: ++ edema b.l,  Neurologic: Alert, oriented  Skin: Stasis dermatitis      External urinary catheter    Basic Metabolic Panel:  Recent Labs  Lab 01/30/20 0629 01/30/20 0629 01/31/20 0628 01/31/20 0628 02/01/20 0504 02/02/20 0535 02/03/20 0421  NA 143  --  143  --  141 140 140  K 4.1  --  3.4*  --  3.7 3.5 3.4*  CL 111  --  112*  --  108 107 106  CO2 24  --  24  --  25 25 23   GLUCOSE 163*  --  169*  --  148* 150* 147*  BUN 41*  --  38*  --  37* 44* 48*  CREATININE 2.57*  --  2.51*  --  2.43* 2.73* 2.63*  CALCIUM 9.2   < > 9.3   < > 9.4 9.3 9.2  MG 2.0  --   --   --   --   --   --    PHOS 3.2  --   --   --   --   --   --    < > = values in this interval not displayed.     CBC: Recent Labs  Lab 01/28/20 1605 01/29/20 0539 02/02/20 0535 02/02/20 1700 02/03/20 0421  WBC 4.7 4.3 7.1  --  9.3  HGB 7.8* 7.6* 6.8* 7.4* 7.8*  HCT 25.6* 24.1* 22.1* 23.0* 25.2*  MCV 94.8 92.7 93.6  --  93.7  PLT 122* 114* 96*  --  108*      Lab Results  Component Value Date   HEPBSAG NON REACTIVE 01/30/2020   HEPBSAB NON REACTIVE 01/30/2020   HEPBIGM NON REACTIVE 01/30/2020      Microbiology:  Recent Results (from the past 240 hour(s))  Respiratory Panel by RT PCR (Flu A&B, Covid) - Nasopharyngeal Swab     Status: None   Collection Time: 01/28/20 10:34 PM   Specimen: Nasopharyngeal Swab  Result Value Ref Range Status   SARS Coronavirus 2 by RT PCR NEGATIVE NEGATIVE Final    Comment: (  NOTE) SARS-CoV-2 target nucleic acids are NOT DETECTED. The SARS-CoV-2 RNA is generally detectable in upper respiratoy specimens during the acute phase of infection. The lowest concentration of SARS-CoV-2 viral copies this assay can detect is 131 copies/mL. A negative result does not preclude SARS-Cov-2 infection and should not be used as the sole basis for treatment or other patient management decisions. A negative result may occur with  improper specimen collection/handling, submission of specimen other than nasopharyngeal swab, presence of viral mutation(s) within the areas targeted by this assay, and inadequate number of viral copies (<131 copies/mL). A negative result must be combined with clinical observations, patient history, and epidemiological information. The expected result is Negative. Fact Sheet for Patients:  PinkCheek.be Fact Sheet for Healthcare Providers:  GravelBags.it This test is not yet ap proved or cleared by the [place] FDA and  has been authorized for detection and/or diagnosis of SARS-CoV-2  by FDA under an Emergency Use Authorization (EUA). This EUA will remain  in effect (meaning this test can be used) for the duration of the COVID-19 declaration under Section 564(b)(1) of the Act, 21 U.S.C. section 360bbb-3(b)(1), unless the authorization is terminated or revoked sooner.    Influenza A by PCR NEGATIVE NEGATIVE Final   Influenza B by PCR NEGATIVE NEGATIVE Final    Comment: (NOTE) The Xpert Xpress SARS-CoV-2/FLU/RSV assay is intended as an aid in  the diagnosis of influenza from Nasopharyngeal swab specimens and  should not be used as a sole basis for treatment. Nasal washings and  aspirates are unacceptable for Xpert Xpress SARS-CoV-2/FLU/RSV  testing. Fact Sheet for Patients: PinkCheek.be Fact Sheet for Healthcare Providers: GravelBags.it This test is not yet approved or cleared by the [place] FDA and  has been authorized for detection and/or diagnosis of SARS-CoV-2 by  FDA under an Emergency Use Authorization (EUA). This EUA will remain  in effect (meaning this test can be used) for the duration of the  Covid-19 declaration under Section 564(b)(1) of the Act, 21  U.S.C. section 360bbb-3(b)(1), unless the authorization is  terminated or revoked. Performed at [facility], [address] [place], [place] [address]     Coagulation Studies: No results for input(s): LABPROT, INR in the last 72 hours.  Urinalysis: No results for input(s): COLORURINE, LABSPEC, PHURINE, GLUCOSEU, HGBUR, BILIRUBINUR, KETONESUR, PROTEINUR, UROBILINOGEN, NITRITE, LEUKOCYTESUR in the last 72 hours.  Invalid input(s): APPERANCEUR    Imaging: DG Chest 1 View  Result Date: 02/03/2020 CLINICAL DATA:  Shortness of breath EXAM: CHEST  1 VIEW COMPARISON:  Yesterday FINDINGS: Cardiomegaly with borderline, stable interstitial coarsening. There is no edema, consolidation, effusion, or pneumothorax. No acute osseous finding  IMPRESSION: Cardiomegaly without failure Electronically Signed   By: Monte Fantasia M.D.   On: 02/03/2020 04:18   DG Chest Port 1 View  Result Date: 02/02/2020 CLINICAL DATA:  69 year old male with shortness of breath. EXAM: PORTABLE CHEST 1 VIEW COMPARISON:  Chest radiograph dated 01/28/2020. FINDINGS: Stable cardiomegaly with vascular congestion. No focal consolidation, pleural effusion, pneumothorax. No acute osseous pathology. IMPRESSION: Cardiomegaly with vascular congestion. No focal consolidation. Electronically Signed   By: Anner Crete M.D.   On: 02/02/2020 19:30     Medications:   . albumin human 25 g (02/03/20 1002)  . furosemide (LASIX) infusion 8 mg/hr (02/03/20 0800)   . acetaminophen  650 mg Oral Once  . atorvastatin  20 mg Oral q1800  . hydrALAZINE  25 mg Oral TID  . insulin aspart  0-9 Units Subcutaneous TID  WC  . iron polysaccharides  150 mg Oral Daily  . lactulose  30 g Oral TID  . pantoprazole  40 mg Oral BID  . pseudoephedrine  30 mg Oral BID  . sodium chloride flush  3 mL Intravenous Once  . 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  Not currently on ACE-I/ARB at home.  Chronic kidney disease secondary to diabetes, hypertension.  Acute renal failure secondary to acute cardiorenal syndrome 05/04 0701 - 05/05 0700 In: 940.8 [P.O.:630; I.V.:136.1; IV  Piggyback:174.7] Out: -   Lab Results  Component Value Date   CREATININE 2.63 (H) 02/03/2020   CREATININE 2.73 (H) 02/02/2020   CREATININE 2.43 (H) 02/01/2020    #Cirrhosis with ascites and volume overload   #Anemia of chronic kidney disease and history of GI bleed. Thrombocytopenia.  Lab Results  Component Value Date   HGB 7.8 (L) 02/03/2020    # Diabetes mellitus type II with chronic kidney disease: insulin dependent. Lab Results  Component Value Date   HGBA1C 6.6 (H) 12/15/2019    Plan - continue furosemide drip - Continue spironolactone - fluid restriction.  - IV albumin.  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 Any transcription errors are unintentional

## 2020-02-03 NOTE — Progress Notes (Signed)
Physical Therapy Treatment Patient Details Name: Corey West MRN: [id number] DOB: Jun 26, 1951 Today's Date: 02/03/2020    History of Present Illness Pt. is a 69 y.o. male who was admitted to [facility] with LE edema, and scrotal pain. Pt. PMHx includes: Hepatic Cirrhosis, with espohageal Varices, portal HTN, gastropathy, IDDM Type 2, and CKD Stage 4, HTN, Hyperlipidemia, Chronic Venous status dermatitis, and Anxiety.    PT Comments    Pt was long sitting in bed upon arriving, just returned to floor from CT scan. He agrees to PT session and is cooperative and pleasant throughout. Reports scrotum pain. Upon arriving, pt HR 86bpm and sao2 97% without nasal cannula in nose. Discontinued O2 during session however he did desat to 86% with ambulation and O2 re-applied. Pt required min assist to get out of bed and then min assist to return to slong sitting after OOB activity. He stood with min assist and then ambulated with RW 20 ft to doorway prior to becoming SOB and requesting seated rest. Pt ask therapist if he feels rehab is needed. Lengthy discussion about therapist recommendations and need for rehab at DC to address deficits with strength, endurance, balance, and overall safe functional mobility. He is agreeable if the right rehab willing to accept. Acute PT will continue to follow per POC. RN entered room at conclusion of session.     Follow Up Recommendations  SNF     Equipment Recommendations  None recommended by PT;Other (comment)    Recommendations for Other Services       Precautions / Restrictions Precautions Precautions: Fall Restrictions Weight Bearing Restrictions: No    Mobility  Bed Mobility Overal bed mobility: Needs Assistance Bed Mobility: Supine to Sit;Sit to Supine     Supine to sit: Min assist Sit to supine: Min assist   General bed mobility comments: Min assist to exit and return to supine. Incraesed time and vcs for technique, sequencing, and  safety  Transfers Overall transfer level: Needs assistance Equipment used: Rolling walker (2 wheeled) Transfers: Sit to/from Stand Sit to Stand: Min assist         General transfer comment: Min assist to stand from EOB with Vcs for handplacement and safety  Ambulation/Gait Ambulation/Gait assistance: Min guard Gait Distance (Feet): 20 Feet Assistive device: Rolling walker (2 wheeled) Gait Pattern/deviations: Step-through pattern Gait velocity: decreased   General Gait Details: Min assist to ambulate to doorway of room and return. he desats to 86% without O2 donned but with breathing techniques recovers to 98%. RN aware.   Stairs             Wheelchair Mobility    Modified Rankin (Stroke Patients Only)       Balance Overall balance assessment: Needs assistance Sitting-balance support: No upper extremity supported Sitting balance-Leahy Scale: Good Sitting balance - Comments: close supervision while seated EOB                                    Cognition Arousal/Alertness: Awake/alert Behavior During Therapy: WFL for tasks assessed/performed Overall Cognitive Status: Within Functional Limits for tasks assessed                                 General Comments: Pt is AOx4 but does appear to have some deficits with insight/judgement      Exercises  General Comments        Pertinent Vitals/Pain Pain Assessment: No/denies pain    Home Living                      Prior Function            PT Goals (current goals can now be found in the care plan section) Acute Rehab PT Goals Patient Stated Goal: To return home Progress towards PT goals: Progressing toward goals    Frequency    Min 2X/week      PT Plan Current plan remains appropriate    Co-evaluation              AM-PAC PT "6 Clicks" Mobility   Outcome Measure  Help needed turning from your back to your side while in a flat bed without  using bedrails?: A Little Help needed moving from lying on your back to sitting on the side of a flat bed without using bedrails?: A Little Help needed moving to and from a bed to a chair (including a wheelchair)?: A Little Help needed standing up from a chair using your arms (e.g., wheelchair or bedside chair)?: A Little Help needed to walk in hospital room?: A Lot Help needed climbing 3-5 steps with a railing? : Total 6 Click Score: 15    End of Session Equipment Utilized During Treatment: Gait belt Activity Tolerance: Patient limited by fatigue Patient left: in bed;with call bell/phone within reach;with bed alarm set;with nursing/sitter in room Nurse Communication: Mobility status PT Visit Diagnosis: Unsteadiness on feet (R26.81);Muscle weakness (generalized) (M62.81);Difficulty in walking, not elsewhere classified (R26.2)     Time: 5038-8828 PT Time Calculation (min) (ACUTE ONLY): 29 min  Charges:  $Gait Training: 8-22 mins $Therapeutic Activity: 8-22 mins                     Julaine Fusi PTA 02/03/20, 3:21 PM

## 2020-02-03 NOTE — Progress Notes (Addendum)
PROGRESS NOTE    Corey West  VOH:[id number] DOB: Sep 18, 1951 DOA: 01/28/2020 PCP: System, Pcp Not In (Confirm with patient/family/NH records and if not entered, this HAS to be entered at [facility] point of entry. "No PCP" if truly none.)   Brief Narrative: (Start on day 1 of progress note - keep it brief and live) [employer] a 69 y.o.malewith medical history significant forhepatic cirrhosis with esophageal varices and portal hypertension gastropathy, insulin-dependent type 2 diabetes, CKD stage IV, hypertension, hyperlipidemia, chronic venous stasis dermatitis and edema bilateral lower extremities, anxiety, and obesity who presents to the ED for evaluation of progressive volume overload.  Patient was seen by nephrology, was placed on Lasix IV drip for volume overload.  Patient also had some visual hallucination with mild elevation of ammonia level.   Assessment & Plan:   Principal Problem:   Volume overload Active Problems:   Cirrhosis (HCC)   Thrombocytopenia (HCC)   Chronic venous stasis dermatitis of both lower extremities   Hypertension associated with diabetes ([place])   Type 2 diabetes mellitus with diabetic nephropathy, with long-term current use of insulin (HCC)   CKD (chronic kidney disease) stage 4, GFR 15-29 ml/min (HCC)   Hyperlipidemia associated with type 2 diabetes mellitus ([place])  #1.  Acute on chronic diastolic congestive heart failure. Patient had a significant volume overload, he has elevated BNP, anasarca.  Chest x-ray performed yesterday showed vascular congestion, which has resolved today.  I personally reviewed patient x-ray images.  Patient also had an increase short of breath today, I will continue furosemide drip for now.  Monitor renal function and electrolytes.  Recent echocardiogram showed ejection fraction 60 to 65%.  2.  Acute hypoxemic restaurant failure. Patient appeared choked when drink some water, he had a short worsening short of breath.  Initially on  3 L oxygen, then started BiPAP.  Condition is more stable now.  I will continue BiPAP and monitor closely in the progressive care unit.  3.  Liver cirrhosis with hepatic encephalopathy. Patient had some visual hallucination yesterday, with elevated ammonia level.  Condition seem to be improving today.  Patient has bilateral leg edema, mostly secondary to liver cirrhosis.  Will obtain ultrasound to rule out DVT.  4.  Chronic kidney disease stage IV. Followed by nephrology.  Receiving albumin while giving Lasix drip. Discussed with nephrology about the treatment plan.  5.  Type 2 diabetes. Continue sliding scale insulin for now.  6.  Anemia/thrombocytopenia. Secondary to liver cirrhosis.  Follow.  7.  Atrial fibrillation. Currently not on anticoagulation due to concern for GI bleed with esophageal varices.  8.  Essential hypertension. Continue some home medicines.  9.  Anemia of chronic kidney disease. Patient has mild iron deficiency, will start iron supplement.  B12 levels normal.  Recheck a CBC tomorrow, transfuse as needed.  10.  Hypokalemia. Supplement orally.  Recheck level tomorrow.      DVT prophylaxis: SCDS Code Status: Full code Family Communication: Plan discussed with the patient, all questions answered. Disposition Plan:  . Patient came from:            . Anticipated d/c place: . Barriers to d/c OR conditions which need to be met to effect a safe d/c: Not ready for discharge.  Consultants:   Nephrology    Procedures: None Antimicrobials:None  Subjective: Patient had a worsening hypoxemia and initially on 3 L.  He has choked, was placed on BiPAP.  He was evaluated by speech therapist, no evidence of  aspiration. No fever or chills. No abdominal pain or nausea vomiting. He has some leg edema appears to be chronic.  Objective: Vitals:   02/03/20 0814 02/03/20 1000 02/03/20 1031 02/03/20 1035  BP: (!) 143/51  102/82   Pulse: 74  81   Resp: (!) 26  18  (!) 27  Temp: 99.3 F (37.4 C)  100.2 F (37.9 C)   TempSrc: Oral  Oral   SpO2: 98% 99% 100%   Weight:      Height:        Intake/Output Summary (Last 24 hours) at 02/03/2020 1209 Last data filed at 02/03/2020 0950 Gross per 24 hour  Intake 1010.84 ml  Output --  Net 1010.84 ml   Filed Weights   01/31/20 0500 02/01/20 0437 02/02/20 0500  Weight: (!) 142.8 kg (!) 140.5 kg (!) 143.3 kg    Examination:  General exam: Appears calm and comfortable  Respiratory system: Coarse breathing sounds bilaterally.  Respiratory effort normal. Cardiovascular system: Irregular. No JVD, murmurs, rubs, gallops or clicks. 2+ pedal edema. Gastrointestinal system: Abdomen is nondistended, soft and nontender. No organomegaly or masses felt. Normal bowel sounds heard. Central nervous system: Alert and oriented. No focal neurological deficits. Extremities: Symmetric  Skin: No rashes, lesions or ulcers Psychiatry: Judgement and insight appear normal. Mood & affect appropriate.     Data Reviewed: I have personally reviewed following labs and imaging studies  CBC: Recent Labs  Lab 01/28/20 1605 01/29/20 0539 02/02/20 0535 02/02/20 1700 02/03/20 0421  WBC 4.7 4.3 7.1  --  9.3  HGB 7.8* 7.6* 6.8* 7.4* 7.8*  HCT 25.6* 24.1* 22.1* 23.0* 25.2*  MCV 94.8 92.7 93.6  --  93.7  PLT 122* 114* 96*  --  742*   Basic Metabolic Panel: Recent Labs  Lab 01/30/20 0629 01/31/20 0628 02/01/20 0504 02/02/20 0535 02/03/20 0421  NA 143 143 141 140 140  K 4.1 3.4* 3.7 3.5 3.4*  CL 111 112* 108 107 106  CO2 '24 24 25 25 23  '$ GLUCOSE 163* 169* 148* 150* 147*  BUN 41* 38* 37* 44* 48*  CREATININE 2.57* 2.51* 2.43* 2.73* 2.63*  CALCIUM 9.2 9.3 9.4 9.3 9.2  MG 2.0  --   --   --   --   PHOS 3.2  --   --   --   --    GFR: Estimated Creatinine Clearance: 38.4 mL/min (A) (by C-G formula based on SCr of 2.63 mg/dL (H)). Liver Function Tests: Recent Labs  Lab 01/28/20 1605 01/29/20 0539 01/30/20 0629  AST 35  31 22  ALT '29 24 20  '$ ALKPHOS 89 82 64  BILITOT 0.9 0.8 0.7  PROT 7.3 6.5 6.5  ALBUMIN 3.0* 2.7* 3.2*   No results for input(s): LIPASE, AMYLASE in the last 168 hours. Recent Labs  Lab 01/28/20 1948 02/02/20 0910  AMMONIA 70* 56*   Coagulation Profile: Recent Labs  Lab 01/28/20 1922 01/29/20 0539  INR 1.2 1.3*   Cardiac Enzymes: No results for input(s): CKTOTAL, CKMB, CKMBINDEX, TROPONINI in the last 168 hours. BNP (last 3 results) No results for input(s): PROBNP in the last 8760 hours. HbA1C: No results for input(s): HGBA1C in the last 72 hours. CBG: Recent Labs  Lab 02/02/20 1702 02/02/20 2106 02/03/20 0336 02/03/20 0808 02/03/20 1031  GLUCAP 131* 139* 131* 150* 132*   Lipid Profile: No results for input(s): CHOL, HDL, LDLCALC, TRIG, CHOLHDL, LDLDIRECT in the last 72 hours. Thyroid Function Tests: No results for input(s): TSH, T4TOTAL,  FREET4, T3FREE, THYROIDAB in the last 72 hours. Anemia Panel: No results for input(s): VITAMINB12, FOLATE, FERRITIN, TIBC, IRON, RETICCTPCT in the last 72 hours. Sepsis Labs: No results for input(s): PROCALCITON, LATICACIDVEN in the last 168 hours.  Recent Results (from the past 240 hour(s))  Respiratory Panel by RT PCR (Flu A&B, Covid) - Nasopharyngeal Swab     Status: None   Collection Time: 01/28/20 10:34 PM   Specimen: Nasopharyngeal Swab  Result Value Ref Range Status   SARS Coronavirus 2 by RT PCR NEGATIVE NEGATIVE Final    Comment: (NOTE) SARS-CoV-2 target nucleic acids are NOT DETECTED. The SARS-CoV-2 RNA is generally detectable in upper respiratoy specimens during the acute phase of infection. The lowest concentration of SARS-CoV-2 viral copies this assay can detect is 131 copies/mL. A negative result does not preclude SARS-Cov-2 infection and should not be used as the sole basis for treatment or other patient management decisions. A negative result may occur with  improper specimen collection/handling, submission  of specimen other than nasopharyngeal swab, presence of viral mutation(s) within the areas targeted by this assay, and inadequate number of viral copies (<131 copies/mL). A negative result must be combined with clinical observations, patient history, and epidemiological information. The expected result is Negative. Fact Sheet for Patients:  PinkCheek.be Fact Sheet for Healthcare Providers:  GravelBags.it This test is not yet ap proved or cleared by the [place] FDA and  has been authorized for detection and/or diagnosis of SARS-CoV-2 by FDA under an Emergency Use Authorization (EUA). This EUA will remain  in effect (meaning this test can be used) for the duration of the COVID-19 declaration under Section 564(b)(1) of the Act, 21 U.S.C. section 360bbb-3(b)(1), unless the authorization is terminated or revoked sooner.    Influenza A by PCR NEGATIVE NEGATIVE Final   Influenza B by PCR NEGATIVE NEGATIVE Final    Comment: (NOTE) The Xpert Xpress SARS-CoV-2/FLU/RSV assay is intended as an aid in  the diagnosis of influenza from Nasopharyngeal swab specimens and  should not be used as a sole basis for treatment. Nasal washings and  aspirates are unacceptable for Xpert Xpress SARS-CoV-2/FLU/RSV  testing. Fact Sheet for Patients: PinkCheek.be Fact Sheet for Healthcare Providers: GravelBags.it This test is not yet approved or cleared by the [place] FDA and  has been authorized for detection and/or diagnosis of SARS-CoV-2 by  FDA under an Emergency Use Authorization (EUA). This EUA will remain  in effect (meaning this test can be used) for the duration of the  Covid-19 declaration under Section 564(b)(1) of the Act, 21  U.S.C. section 360bbb-3(b)(1), unless the authorization is  terminated or revoked. Performed at [facility], [address]  [place], [place] [address]          Radiology Studies: DG Chest 1 View  Result Date: 02/03/2020 CLINICAL DATA:  Shortness of breath EXAM: CHEST  1 VIEW COMPARISON:  Yesterday FINDINGS: Cardiomegaly with borderline, stable interstitial coarsening. There is no edema, consolidation, effusion, or pneumothorax. No acute osseous finding IMPRESSION: Cardiomegaly without failure Electronically Signed   By: Monte Fantasia M.D.   On: 02/03/2020 04:18   DG Chest Port 1 View  Result Date: 02/02/2020 CLINICAL DATA:  69 year old male with shortness of breath. EXAM: PORTABLE CHEST 1 VIEW COMPARISON:  Chest radiograph dated 01/28/2020. FINDINGS: Stable cardiomegaly with vascular congestion. No focal consolidation, pleural effusion, pneumothorax. No acute osseous pathology. IMPRESSION: Cardiomegaly with vascular congestion. No focal consolidation. Electronically Signed   By: Laren Everts.D.  On: 02/02/2020 19:30        Scheduled Meds: . acetaminophen  650 mg Oral Once  . atorvastatin  20 mg Oral q1800  . hydrALAZINE  25 mg Oral TID  . insulin aspart  0-9 Units Subcutaneous TID WC  . iron polysaccharides  150 mg Oral Daily  . lactulose  30 g Oral TID  . pantoprazole  40 mg Oral BID  . pseudoephedrine  30 mg Oral BID  . sodium chloride flush  3 mL Intravenous Once  . spironolactone  25 mg Oral Daily   Continuous Infusions: . albumin human 25 g (02/03/20 1002)  . furosemide (LASIX) infusion 8 mg/hr (02/03/20 0800)     LOS: 5 days    Time spent: 35 minutes    Sharen Hones, MD [facility]   To contact the attending provider between 7A-7P or the covering provider during after hours 7P-7A, please log into the web site [email or web address] and access using universal [place] password for that web site. If you do not have the password, please call the hospital operator.  02/03/2020, 12:09 PM

## 2020-02-03 NOTE — Progress Notes (Signed)
Correction patient to transfer 2A when bed available. Patient has tried to take his mask off. However at this time has it on after education. Will continue to monitor patient. Safety checks completed and call light placed within reach.

## 2020-02-03 NOTE — Progress Notes (Signed)
Patient now on Bipap at this time. With plans to transfer to ICU when bed available.

## 2020-02-03 NOTE — Progress Notes (Signed)
Patient transfer approved room 258. Gave report to [place], Therapist, sports.   Fuller Mandril, RN

## 2020-02-04 DIAGNOSIS — J9601 Acute respiratory failure with hypoxia: Secondary | ICD-10-CM | POA: Diagnosis present

## 2020-02-04 LAB — CBC WITH DIFFERENTIAL/PLATELET
Abs Immature Granulocytes: 0.03 10*3/uL (ref 0.00–0.07)
Basophils Absolute: 0 10*3/uL (ref 0.0–0.1)
Basophils Relative: 0 %
Eosinophils Absolute: 0 10*3/uL (ref 0.0–0.5)
Eosinophils Relative: 0 %
HCT: 21.1 % — ABNORMAL LOW (ref 39.0–52.0)
Hemoglobin: 6.4 g/dL — ABNORMAL LOW (ref 13.0–17.0)
Immature Granulocytes: 0 %
Lymphocytes Relative: 7 %
Lymphs Abs: 0.5 10*3/uL — ABNORMAL LOW (ref 0.7–4.0)
MCH: 29 pg (ref 26.0–34.0)
MCHC: 30.3 g/dL (ref 30.0–36.0)
MCV: 95.5 fL (ref 80.0–100.0)
Monocytes Absolute: 0.5 10*3/uL (ref 0.1–1.0)
Monocytes Relative: 6 %
Neutro Abs: 7 10*3/uL (ref 1.7–7.7)
Neutrophils Relative %: 87 %
Platelets: 87 10*3/uL — ABNORMAL LOW (ref 150–400)
RBC: 2.21 MIL/uL — ABNORMAL LOW (ref 4.22–5.81)
RDW: 14.8 % (ref 11.5–15.5)
WBC: 8.1 10*3/uL (ref 4.0–10.5)
nRBC: 0 % (ref 0.0–0.2)

## 2020-02-04 LAB — BASIC METABOLIC PANEL
Anion gap: 7 (ref 5–15)
BUN: 61 mg/dL — ABNORMAL HIGH (ref 8–23)
CO2: 26 mmol/L (ref 22–32)
Calcium: 8.6 mg/dL — ABNORMAL LOW (ref 8.9–10.3)
Chloride: 106 mmol/L (ref 98–111)
Creatinine, Ser: 3.3 mg/dL — ABNORMAL HIGH (ref 0.61–1.24)
GFR calc Af Amer: 21 mL/min — ABNORMAL LOW (ref >60)
GFR calc non Af Amer: 18 mL/min — ABNORMAL LOW (ref >60)
Glucose, Bld: 198 mg/dL — ABNORMAL HIGH (ref 70–99)
Potassium: 3.8 mmol/L (ref 3.5–5.1)
Sodium: 139 mmol/L (ref 135–145)

## 2020-02-04 LAB — PREPARE RBC (CROSSMATCH)

## 2020-02-04 LAB — GLUCOSE, CAPILLARY
Glucose-Capillary: 176 mg/dL — ABNORMAL HIGH (ref 70–99)
Glucose-Capillary: 171 mg/dL — ABNORMAL HIGH (ref 70–99)
Glucose-Capillary: 207 mg/dL — ABNORMAL HIGH (ref 70–99)
Glucose-Capillary: 259 mg/dL — ABNORMAL HIGH (ref 70–99)

## 2020-02-04 LAB — HEMOGLOBIN
Hemoglobin: 7.4 g/dL — ABNORMAL LOW (ref 13.0–17.0)
Hemoglobin: 7.6 g/dL — ABNORMAL LOW (ref 13.0–17.0)

## 2020-02-04 LAB — MAGNESIUM: Magnesium: 1.9 mg/dL (ref 1.7–2.4)

## 2020-02-04 MED ORDER — PANTOPRAZOLE SODIUM 40 MG IV SOLR
40.0000 mg | Freq: Two times a day (BID) | INTRAVENOUS | Status: DC
  Administered 2020-02-04 – 2020-02-07 (×6): 40 mg via INTRAVENOUS
  Filled 2020-02-04 (×6): qty 40

## 2020-02-04 MED ORDER — SODIUM CHLORIDE 0.9% IV SOLUTION: Freq: Once | INTRAVENOUS | Status: AC

## 2020-02-04 NOTE — TOC Progression Note (Signed)
Transition of Care [facility]) - Progression Note    Patient Details  Name: Corey West MRN: [id number] Date of Birth: Oct 06, 1950  Transition of Care [facility] CM/SW [place], RN Phone Number: 02/04/2020, 3:01 PM  Clinical Narrative:      Met with patient today to discuss  Short term Rehab choices.  Patient is still resistant to going after discharge, but ultimately go if it is still recommended at discharge.  Patient does not want to make a choice at this time.  He is still waiting to hear from his GI doctor about his bleeding.  Will follow up with patient tomorrow.    Expected Discharge Plan: [place] Barriers to Discharge: Continued Medical Work up  Expected Discharge Plan and Services Expected Discharge Plan: [place] Acute Care Choice: Resumption of Svcs/PTA Provider Living arrangements for the past 2 months: Apartment(Condo)                                       Social Determinants of Health (SDOH) Interventions    Readmission Risk Interventions Readmission Risk Prevention Plan 02/02/2020 12/15/2019 02/03/2019  Transportation Screening Complete Complete Complete  PCP or Specialist Appt within 3-5 Days Complete - -  HRI or Home Care Consult Complete - -  Social Work Consult for [place] Planning/Counseling Complete - -  Palliative Care Screening Not Applicable - -  Medication Review Press photographer) Complete Complete Complete  PCP or Specialist appointment within 3-5 days of discharge - Complete -  [place] or [place] - Complete Complete  SW Recovery Care/Counseling Consult - - Not Complete  SW Consult Not Complete Comments - - NA  Palliative Care Screening - Not Applicable Not Applicable  Skilled Nursing Facility - Not Applicable Not Applicable  Some recent data might be hidden

## 2020-02-04 NOTE — Care Management Important Message (Signed)
Important Message  Patient Details  Name: MACIO KISSOON MRN: [id number] Date of Birth: 1951-04-13   Medicare Important Message Given:  Yes     Dannette Barbara 02/04/2020, 2:51 PM

## 2020-02-04 NOTE — Progress Notes (Signed)
[place], [place] 02/04/20  Subjective:   Hospital day # 6  Renal consult for volume overload and anasarca  Continues to have large amount of lower extremity edema Today reports he has blood in his nose.  He was recently placed on oxygen Plan is to place a humidifier Hemoglobin dropped again. Patient is requesting GI consult   Objective:  Vital signs in last 24 hours:  Temp:  [98 F (36.7 C)-98.2 F (36.8 C)] 98 F (36.7 C) (05/06 0815) Pulse Rate:  [63-67] 63 (05/06 0815) Resp:  [18-20] 18 (05/06 0815) BP: (115-144)/(46-59) 123/59 (05/06 0815) SpO2:  [100 %] 100 % (05/06 0815) Weight:  [142.5 kg] 142.5 kg (05/06 0428)  Weight change:  Filed Weights   02/01/20 0437 02/02/20 0500 02/04/20 0428  Weight: (!) 140.5 kg (!) 143.3 kg (!) 142.5 kg    Intake/Output:    Intake/Output Summary (Last 24 hours) at 02/04/2020 1039 Last data filed at 02/04/2020 0945 Gross per 24 hour  Intake 481 ml  Output 0 ml  Net 481 ml     Physical Exam: General: NAD, laying in bed,   HEENT Anicteric   Pulm/lungs Coarse breath sounds,[facility] O2  CVS/Heart irregular  Abdomen:  Soft, NT  Extremities: ++ edema b.l,  Neurologic: Alert, oriented  Skin: Stasis dermatitis, right leg warmer than left, redness      External urinary catheter, rectal tube    Basic Metabolic Panel:  Recent Labs  Lab 01/30/20 0629 01/30/20 0629 01/31/20 2355 01/31/20 0628 02/01/20 0504 02/01/20 0504 02/02/20 0535 02/03/20 0421 02/04/20 0411  NA 143   < > 143  --  141  --  140 140 139  K 4.1   < > 3.4*  --  3.7  --  3.5 3.4* 3.8  CL 111   < > 112*  --  108  --  107 106 106  CO2 24   < > 24  --  25  --  25 23 26   GLUCOSE 163*   < > 169*  --  148*  --  150* 147* 198*  BUN 41*   < > 38*  --  37*  --  44* 48* 61*  CREATININE 2.57*   < > 2.51*  --  2.43*  --  2.73* 2.63* 3.30*  CALCIUM 9.2   < > 9.3   < > 9.4   < > 9.3 9.2 8.6*  MG 2.0  --   --   --   --   --   --   --  1.9   PHOS 3.2  --   --   --   --   --   --   --   --    < > = values in this interval not displayed.     CBC: Recent Labs  Lab 01/28/20 1605 01/28/20 1605 01/29/20 0539 02/02/20 0535 02/02/20 1700 02/03/20 0421 02/04/20 0411  WBC 4.7  --  4.3 7.1  --  9.3 8.1  NEUTROABS  --   --   --   --   --   --  7.0  HGB 7.8*   < > 7.6* 6.8* 7.4* 7.8* 6.4*  HCT 25.6*   < > 24.1* 22.1* 23.0* 25.2* 21.1*  MCV 94.8  --  92.7 93.6  --  93.7 95.5  PLT 122*  --  114* 96*  --  108* 87*   < > = values in this interval not displayed.  Lab Results  Component Value Date   HEPBSAG NON REACTIVE 01/30/2020   HEPBSAB NON REACTIVE 01/30/2020   HEPBIGM NON REACTIVE 01/30/2020      Microbiology:  Recent Results (from the past 240 hour(s))  Respiratory Panel by RT PCR (Flu A&B, Covid) - Nasopharyngeal Swab     Status: None   Collection Time: 01/28/20 10:34 PM   Specimen: Nasopharyngeal Swab  Result Value Ref Range Status   SARS Coronavirus 2 by RT PCR NEGATIVE NEGATIVE Final    Comment: (NOTE) SARS-CoV-2 target nucleic acids are NOT DETECTED. The SARS-CoV-2 RNA is generally detectable in upper respiratoy specimens during the acute phase of infection. The lowest concentration of SARS-CoV-2 viral copies this assay can detect is 131 copies/mL. A negative result does not preclude SARS-Cov-2 infection and should not be used as the sole basis for treatment or other patient management decisions. A negative result may occur with  improper specimen collection/handling, submission of specimen other than nasopharyngeal swab, presence of viral mutation(s) within the areas targeted by this assay, and inadequate number of viral copies (<131 copies/mL). A negative result must be combined with clinical observations, patient history, and epidemiological information. The expected result is Negative. Fact Sheet for Patients:  PinkCheek.be Fact Sheet for Healthcare Providers:   GravelBags.it This test is not yet ap proved or cleared by the [place] FDA and  has been authorized for detection and/or diagnosis of SARS-CoV-2 by FDA under an Emergency Use Authorization (EUA). This EUA will remain  in effect (meaning this test can be used) for the duration of the COVID-19 declaration under Section 564(b)(1) of the Act, 21 U.S.C. section 360bbb-3(b)(1), unless the authorization is terminated or revoked sooner.    Influenza A by PCR NEGATIVE NEGATIVE Final   Influenza B by PCR NEGATIVE NEGATIVE Final    Comment: (NOTE) The Xpert Xpress SARS-CoV-2/FLU/RSV assay is intended as an aid in  the diagnosis of influenza from Nasopharyngeal swab specimens and  should not be used as a sole basis for treatment. Nasal washings and  aspirates are unacceptable for Xpert Xpress SARS-CoV-2/FLU/RSV  testing. Fact Sheet for Patients: PinkCheek.be Fact Sheet for Healthcare Providers: GravelBags.it This test is not yet approved or cleared by the [place] FDA and  has been authorized for detection and/or diagnosis of SARS-CoV-2 by  FDA under an Emergency Use Authorization (EUA). This EUA will remain  in effect (meaning this test can be used) for the duration of the  Covid-19 declaration under Section 564(b)(1) of the Act, 21  U.S.C. section 360bbb-3(b)(1), unless the authorization is  terminated or revoked. Performed at [facility], [address] [place], [place] [address]     Coagulation Studies: No results for input(s): LABPROT, INR in the last 72 hours.  Urinalysis: No results for input(s): COLORURINE, LABSPEC, PHURINE, GLUCOSEU, HGBUR, BILIRUBINUR, KETONESUR, PROTEINUR, UROBILINOGEN, NITRITE, LEUKOCYTESUR in the last 72 hours.  Invalid input(s): APPERANCEUR    Imaging: DG Chest 1 View  Result Date: 02/03/2020 CLINICAL DATA:  Shortness of breath EXAM: CHEST  1  VIEW COMPARISON:  Yesterday FINDINGS: Cardiomegaly with borderline, stable interstitial coarsening. There is no edema, consolidation, effusion, or pneumothorax. No acute osseous finding IMPRESSION: Cardiomegaly without failure Electronically Signed   By: Monte Fantasia M.D.   On: 02/03/2020 04:18   US Venous Img Lower Bilateral (DVT)  Result Date: 02/03/2020 CLINICAL DATA:  Redness and swelling of the bilateral lower extremities EXAM: BILATERAL LOWER EXTREMITY VENOUS DOPPLER ULTRASOUND TECHNIQUE: Gray-scale sonography with graded compression, as  well as color Doppler and duplex ultrasound were performed to evaluate the lower extremity deep venous systems from the level of the common femoral vein and including the common femoral, femoral, profunda femoral, popliteal and calf veins including the posterior tibial, peroneal and gastrocnemius veins when visible. The superficial great saphenous vein was also interrogated. Spectral Doppler was utilized to evaluate flow at rest and with distal augmentation maneuvers in the common femoral, femoral and popliteal veins. COMPARISON:  None. FINDINGS: RIGHT LOWER EXTREMITY Common Femoral Vein: No evidence of thrombus. Normal compressibility, respiratory phasicity and response to augmentation. Saphenofemoral Junction: No evidence of thrombus. Normal compressibility and flow on color Doppler imaging. Profunda Femoral Vein: No evidence of thrombus. Normal compressibility and flow on color Doppler imaging. Femoral Vein: No evidence of thrombus. Normal compressibility, respiratory phasicity and response to augmentation. Popliteal Vein: No evidence of thrombus. Normal compressibility, respiratory phasicity and response to augmentation. Calf Veins: No evidence of thrombus. Normal compressibility and flow on color Doppler imaging. Superficial Great Saphenous Vein: No evidence of thrombus. Normal compressibility. Venous Reflux:  None. Other Findings:  None. LEFT LOWER EXTREMITY Common  Femoral Vein: No evidence of thrombus. Normal compressibility, respiratory phasicity and response to augmentation. Saphenofemoral Junction: No evidence of thrombus. Normal compressibility and flow on color Doppler imaging. Profunda Femoral Vein: No evidence of thrombus. Normal compressibility and flow on color Doppler imaging. Femoral Vein: No evidence of thrombus. Normal compressibility, respiratory phasicity and response to augmentation. Popliteal Vein: No evidence of thrombus. Normal compressibility, respiratory phasicity and response to augmentation. Calf Veins: No evidence of thrombus. Normal compressibility and flow on color Doppler imaging. Superficial Great Saphenous Vein: No evidence of thrombus. Normal compressibility. Venous Reflux:  None. Other Findings:  None. IMPRESSION: No evidence of deep venous thrombosis in either lower extremity. Electronically Signed   By: Jacqulynn Cadet M.D.   On: 02/03/2020 14:13   DG Chest Port 1 View  Result Date: 02/02/2020 CLINICAL DATA:  69 year old male with shortness of breath. EXAM: PORTABLE CHEST 1 VIEW COMPARISON:  Chest radiograph dated 01/28/2020. FINDINGS: Stable cardiomegaly with vascular congestion. No focal consolidation, pleural effusion, pneumothorax. No acute osseous pathology. IMPRESSION: Cardiomegaly with vascular congestion. No focal consolidation. Electronically Signed   By: Anner Crete M.D.   On: 02/02/2020 19:30     Medications:   . furosemide (LASIX) infusion 8 mg/hr (02/04/20 0300)   . acetaminophen  650 mg Oral Once  . atorvastatin  20 mg Oral q1800  . hydrALAZINE  25 mg Oral TID  . insulin aspart  0-9 Units Subcutaneous TID WC  . iron polysaccharides  150 mg Oral Daily  . lactulose  30 g Oral TID  . pantoprazole  40 mg Oral BID  . pseudoephedrine  30 mg Oral BID  . sodium chloride flush  3 mL Intravenous Once  . 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  Not currently on ACE-I/ARB at home.  Chronic kidney disease secondary to diabetes, hypertension.  Acute renal failure secondary to acute cardiorenal  syndrome 05/05 0701 - 05/06 0700 In: 431 [P.O.:150; I.V.:184.4; IV Piggyback:96.6] Out: 0   Lab Results  Component Value Date   CREATININE 3.30 (H) 02/04/2020   CREATININE 2.63 (H) 02/03/2020   CREATININE 2.73 (H) 02/02/2020    #Cirrhosis with ascites and volume overload   #Anemia of chronic kidney disease and history of GI bleed. Thrombocytopenia.  Lab Results  Component Value Date   HGB 6.4 (L) 02/04/2020    # Diabetes mellitus type II with chronic kidney disease: insulin dependent. Lab Results  Component Value Date   HGBA1C 6.6 (H) 12/15/2019    Plan - continue furosemide drip - Continue spironolactone - fluid restriction.  - IV albumin.  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 Any transcription errors are unintentional

## 2020-02-04 NOTE — Progress Notes (Signed)
PROGRESS NOTE    Corey West  WRU:[id number] DOB: 07-27-1951 DOA: 01/28/2020 PCP: System, Pcp Not In (Confirm with patient/family/NH records and if not entered, this HAS to be entered at [facility] point of entry. "No PCP" if truly none.)   Brief Narrative: (Start on day 1 of progress note - keep it brief and live) [employer] a 69 y.o.malewith medical history significant forhepatic cirrhosis with esophageal varices and portal hypertension gastropathy, insulin-dependent type 2 diabetes, CKD stage IV, hypertension, hyperlipidemia, chronic venous stasis dermatitis and edema bilateral lower extremities, anxiety, and obesity who presents to the ED for evaluation of progressive volume overload.  Patient was seen by nephrology, was placed on Lasix IV drip for volume overload.  Patient also had some visual hallucination with mild elevation of ammonia level.       Assessment & Plan:   Principal Problem:   Volume overload Active Problems:   Cirrhosis (HCC)   Thrombocytopenia (HCC)   Chronic venous stasis dermatitis of both lower extremities   Hypertension associated with diabetes ([place])   Type 2 diabetes mellitus with diabetic nephropathy, with long-term current use of insulin (HCC)   CKD (chronic kidney disease) stage 4, GFR 15-29 ml/min (HCC)   Hyperlipidemia associated with type 2 diabetes mellitus ([place])  #1.  Acute on chronic anemia.  Suspect acute blood loss anemia. Patient has a drop in hemoglobin to 6.4 today from 7.8 yesterday.  He has history of liver cirrhosis and portal hypertension.  Likely GI bleed.  Will obtain GI consult.  Transfuse 1 unit PRBC.  Patient has not had any black stool or rectal bleeding.  Recheck a CBC tomorrow.  #2. Acute hypoxemic respiratory failure. Secondary to acute on chronic diastolic congestive heart failure.  Patient off BiPAP today.  Still on 3 L oxygen.  Continue current treatment.  3.  Acute on chronic diastolic congestive heart failure. On Lasix  drip under the guidance of nephrology.  Monitor electrolytes and renal function.  #4.  Liver cirrhosis with hepatic encephalopathy. Renal function much improved.  Still has some leg edema, but better than yesterday.  Duplex ultrasound did not show any DVT in bilateral lower extremities.  #5.  Chronic kidney disease stage IV. Worsening renal function today.  This may be secondary to diuretics with a possible GI bleed.  Followed by nephrology.  6.  Type 2 diabetes. Continue sliding scale insulin.  7.  Atrial fibrillation. Not a candidate for anticoagulation.  8.  Thrombocytopenia. Follow.  DVT prophylaxis: SCDS Code Status: Full code Family Communication: Plan discussed with the patient, all questions answered. Disposition Plan:   Patient came from:                                                                                                                          Anticipated d/c place:  Barriers to d/c OR conditions which need to be met to effect a safe d/c: Not ready for discharge.  Consultants:   Nephrology  Procedures: None Antimicrobials:None     Subjective: Patient off BiPAP, currently on 3 L oxygen.  Still has short of breath with exertion, but not short of breath at rest. No cough. Poor appetite without nausea vomiting.  No black stool or rectal bleeding.  Objective: Vitals:   02/03/20 1944 02/04/20 0428 02/04/20 0815 02/04/20 1131  BP: (!) 144/56 (!) 120/54 (!) 123/59 (!) 125/54  Pulse: 67 65 63 67  Resp:   18 19  Temp: 98.2 F (36.8 C) 98 F (36.7 C) 98 F (36.7 C) 97.7 F (36.5 C)  TempSrc: Oral Oral Oral   SpO2: 100% 100% 100% 100%  Weight:  (!) 142.5 kg    Height:        Intake/Output Summary (Last 24 hours) at 02/04/2020 1227 Last data filed at 02/04/2020 1129 Gross per 24 hour  Intake 481 ml  Output 0 ml  Net 481 ml   Filed Weights   02/01/20 0437 02/02/20 0500 02/04/20 0428  Weight: (!) 140.5 kg (!) 143.3 kg (!) 142.5 kg     Examination:  General exam: Appears calm and comfortable  Respiratory system: Coarse breathing sounds. Respiratory effort normal. Cardiovascular system: Irregular. No JVD, murmurs, rubs, gallops or clicks. 2+ pedal edema. Gastrointestinal system: Abdomen is nondistended, soft and nontender. No organomegaly or masses felt. Normal bowel sounds heard. Central nervous system: Alert and oriented. No focal neurological deficits. Extremities: Symmetric  Skin: No rashes, lesions or ulcers Psychiatry: Judgement and insight appear normal. Mood & affect appropriate.     Data Reviewed: I have personally reviewed following labs and imaging studies  CBC: Recent Labs  Lab 01/28/20 1605 01/28/20 1605 01/29/20 0539 02/02/20 0535 02/02/20 1700 02/03/20 0421 02/04/20 0411  WBC 4.7  --  4.3 7.1  --  9.3 8.1  NEUTROABS  --   --   --   --   --   --  7.0  HGB 7.8*   < > 7.6* 6.8* 7.4* 7.8* 6.4*  HCT 25.6*   < > 24.1* 22.1* 23.0* 25.2* 21.1*  MCV 94.8  --  92.7 93.6  --  93.7 95.5  PLT 122*  --  114* 96*  --  108* 87*   < > = values in this interval not displayed.   Basic Metabolic Panel: Recent Labs  Lab 01/30/20 0629 01/30/20 0629 01/31/20 0628 02/01/20 0504 02/02/20 0535 02/03/20 0421 02/04/20 0411  NA 143   < > 143 141 140 140 139  K 4.1   < > 3.4* 3.7 3.5 3.4* 3.8  CL 111   < > 112* 108 107 106 106  CO2 24   < > '24 25 25 23 26  '$ GLUCOSE 163*   < > 169* 148* 150* 147* 198*  BUN 41*   < > 38* 37* 44* 48* 61*  CREATININE 2.57*   < > 2.51* 2.43* 2.73* 2.63* 3.30*  CALCIUM 9.2   < > 9.3 9.4 9.3 9.2 8.6*  MG 2.0  --   --   --   --   --  1.9  PHOS 3.2  --   --   --   --   --   --    < > = values in this interval not displayed.   GFR: Estimated Creatinine Clearance: 30.5 mL/min (A) (by C-G formula based on SCr of 3.3 mg/dL (H)). Liver Function Tests: Recent Labs  Lab 01/28/20 1605 01/29/20 0539 01/30/20 0629  AST 35 31 22  ALT 29  24 20  ALKPHOS 89 82 64  BILITOT 0.9 0.8  0.7  PROT 7.3 6.5 6.5  ALBUMIN 3.0* 2.7* 3.2*   No results for input(s): LIPASE, AMYLASE in the last 168 hours. Recent Labs  Lab 01/28/20 1948 02/02/20 0910  AMMONIA 70* 56*   Coagulation Profile: Recent Labs  Lab 01/28/20 1922 01/29/20 0539  INR 1.2 1.3*   Cardiac Enzymes: No results for input(s): CKTOTAL, CKMB, CKMBINDEX, TROPONINI in the last 168 hours. BNP (last 3 results) No results for input(s): PROBNP in the last 8760 hours. HbA1C: No results for input(s): HGBA1C in the last 72 hours. CBG: Recent Labs  Lab 02/03/20 1210 02/03/20 1719 02/03/20 2108 02/04/20 0749 02/04/20 1111  GLUCAP 155* 199* 213* 176* 171*   Lipid Profile: No results for input(s): CHOL, HDL, LDLCALC, TRIG, CHOLHDL, LDLDIRECT in the last 72 hours. Thyroid Function Tests: No results for input(s): TSH, T4TOTAL, FREET4, T3FREE, THYROIDAB in the last 72 hours. Anemia Panel: No results for input(s): VITAMINB12, FOLATE, FERRITIN, TIBC, IRON, RETICCTPCT in the last 72 hours. Sepsis Labs: No results for input(s): PROCALCITON, LATICACIDVEN in the last 168 hours.  Recent Results (from the past 240 hour(s))  Respiratory Panel by RT PCR (Flu A&B, Covid) - Nasopharyngeal Swab     Status: None   Collection Time: 01/28/20 10:34 PM   Specimen: Nasopharyngeal Swab  Result Value Ref Range Status   SARS Coronavirus 2 by RT PCR NEGATIVE NEGATIVE Final    Comment: (NOTE) SARS-CoV-2 target nucleic acids are NOT DETECTED. The SARS-CoV-2 RNA is generally detectable in upper respiratoy specimens during the acute phase of infection. The lowest concentration of SARS-CoV-2 viral copies this assay can detect is 131 copies/mL. A negative result does not preclude SARS-Cov-2 infection and should not be used as the sole basis for treatment or other patient management decisions. A negative result may occur with  improper specimen collection/handling, submission of specimen other than nasopharyngeal swab, presence of  viral mutation(s) within the areas targeted by this assay, and inadequate number of viral copies (<131 copies/mL). A negative result must be combined with clinical observations, patient history, and epidemiological information. The expected result is Negative. Fact Sheet for Patients:  PinkCheek.be Fact Sheet for Healthcare Providers:  GravelBags.it This test is not yet ap proved or cleared by the [place] FDA and  has been authorized for detection and/or diagnosis of SARS-CoV-2 by FDA under an Emergency Use Authorization (EUA). This EUA will remain  in effect (meaning this test can be used) for the duration of the COVID-19 declaration under Section 564(b)(1) of the Act, 21 U.S.C. section 360bbb-3(b)(1), unless the authorization is terminated or revoked sooner.    Influenza A by PCR NEGATIVE NEGATIVE Final   Influenza B by PCR NEGATIVE NEGATIVE Final    Comment: (NOTE) The Xpert Xpress SARS-CoV-2/FLU/RSV assay is intended as an aid in  the diagnosis of influenza from Nasopharyngeal swab specimens and  should not be used as a sole basis for treatment. Nasal washings and  aspirates are unacceptable for Xpert Xpress SARS-CoV-2/FLU/RSV  testing. Fact Sheet for Patients: PinkCheek.be Fact Sheet for Healthcare Providers: GravelBags.it This test is not yet approved or cleared by the [place] FDA and  has been authorized for detection and/or diagnosis of SARS-CoV-2 by  FDA under an Emergency Use Authorization (EUA). This EUA will remain  in effect (meaning this test can be used) for the duration of the  Covid-19 declaration under Section 564(b)(1) of the Act, 21  U.S.C. section 360bbb-3(b)(1),  unless the authorization is  terminated or revoked. Performed at [facility], [address], [place], [place] [address]          Radiology Studies: DG  Chest 1 View  Result Date: 02/03/2020 CLINICAL DATA:  Shortness of breath EXAM: CHEST  1 VIEW COMPARISON:  Yesterday FINDINGS: Cardiomegaly with borderline, stable interstitial coarsening. There is no edema, consolidation, effusion, or pneumothorax. No acute osseous finding IMPRESSION: Cardiomegaly without failure Electronically Signed   By: Monte Fantasia M.D.   On: 02/03/2020 04:18   US Venous Img Lower Bilateral (DVT)  Result Date: 02/03/2020 CLINICAL DATA:  Redness and swelling of the bilateral lower extremities EXAM: BILATERAL LOWER EXTREMITY VENOUS DOPPLER ULTRASOUND TECHNIQUE: Gray-scale sonography with graded compression, as well as color Doppler and duplex ultrasound were performed to evaluate the lower extremity deep venous systems from the level of the common femoral vein and including the common femoral, femoral, profunda femoral, popliteal and calf veins including the posterior tibial, peroneal and gastrocnemius veins when visible. The superficial great saphenous vein was also interrogated. Spectral Doppler was utilized to evaluate flow at rest and with distal augmentation maneuvers in the common femoral, femoral and popliteal veins. COMPARISON:  None. FINDINGS: RIGHT LOWER EXTREMITY Common Femoral Vein: No evidence of thrombus. Normal compressibility, respiratory phasicity and response to augmentation. Saphenofemoral Junction: No evidence of thrombus. Normal compressibility and flow on color Doppler imaging. Profunda Femoral Vein: No evidence of thrombus. Normal compressibility and flow on color Doppler imaging. Femoral Vein: No evidence of thrombus. Normal compressibility, respiratory phasicity and response to augmentation. Popliteal Vein: No evidence of thrombus. Normal compressibility, respiratory phasicity and response to augmentation. Calf Veins: No evidence of thrombus. Normal compressibility and flow on color Doppler imaging. Superficial Great Saphenous Vein: No evidence of thrombus. Normal  compressibility. Venous Reflux:  None. Other Findings:  None. LEFT LOWER EXTREMITY Common Femoral Vein: No evidence of thrombus. Normal compressibility, respiratory phasicity and response to augmentation. Saphenofemoral Junction: No evidence of thrombus. Normal compressibility and flow on color Doppler imaging. Profunda Femoral Vein: No evidence of thrombus. Normal compressibility and flow on color Doppler imaging. Femoral Vein: No evidence of thrombus. Normal compressibility, respiratory phasicity and response to augmentation. Popliteal Vein: No evidence of thrombus. Normal compressibility, respiratory phasicity and response to augmentation. Calf Veins: No evidence of thrombus. Normal compressibility and flow on color Doppler imaging. Superficial Great Saphenous Vein: No evidence of thrombus. Normal compressibility. Venous Reflux:  None. Other Findings:  None. IMPRESSION: No evidence of deep venous thrombosis in either lower extremity. Electronically Signed   By: Jacqulynn Cadet M.D.   On: 02/03/2020 14:13   DG Chest Port 1 View  Result Date: 02/02/2020 CLINICAL DATA:  69 year old male with shortness of breath. EXAM: PORTABLE CHEST 1 VIEW COMPARISON:  Chest radiograph dated 01/28/2020. FINDINGS: Stable cardiomegaly with vascular congestion. No focal consolidation, pleural effusion, pneumothorax. No acute osseous pathology. IMPRESSION: Cardiomegaly with vascular congestion. No focal consolidation. Electronically Signed   By: Anner Crete M.D.   On: 02/02/2020 19:30        Scheduled Meds: . sodium chloride   Intravenous Once  . acetaminophen  650 mg Oral Once  . atorvastatin  20 mg Oral q1800  . hydrALAZINE  25 mg Oral TID  . insulin aspart  0-9 Units Subcutaneous TID WC  . iron polysaccharides  150 mg Oral Daily  . lactulose  30 g Oral TID  . pantoprazole (PROTONIX) IV  40 mg Intravenous Q12H  . pseudoephedrine  30  mg Oral BID  . sodium chloride flush  3 mL Intravenous Once  .  spironolactone  25 mg Oral Daily   Continuous Infusions: . furosemide (LASIX) infusion 8 mg/hr (02/04/20 0300)     LOS: 6 days    Time spent: 35 minutes    Sharen Hones, MD [facility]   To contact the attending provider between 7A-7P or the covering provider during after hours 7P-7A, please log into the web site [email or web address] and access using universal [place] password for that web site. If you do not have the password, please call the hospital operator.  02/04/2020, 12:27 PM

## 2020-02-04 NOTE — Consult Note (Signed)
I was consulted for anemia.  I walked into the patient's room and he states he only wants to be seen and any procedures that need to be done, to be done by [facility] GI physicians only.  I told him that I contacted Dr. [name], who is only [facility] GI physician is Dr. Vira Agar and Dr. Gustavo Lah retired, and Dr. [name] is not available.  I asked the patient how he would like to proceed and if he is okay with me proceeding with the consult.  He states he does not want to deal with me and he will call [facility] GI. Dr. Roosevelt Locks informed. I therefore cannot complete the consult per pt request

## 2020-02-04 NOTE — Plan of Care (Signed)

## 2020-02-04 NOTE — Progress Notes (Signed)
Patient has refused bipap for the night. 

## 2020-02-04 NOTE — Progress Notes (Signed)
CRITICAL VALUE ALERT  Critical Value:  Hgb 6.4  Date & Time Notied:  02/04/20  Provider Notified: Dr. Roosevelt Locks   Orders Received/Actions taken: MD will come to assess patient.

## 2020-02-04 NOTE — Progress Notes (Signed)
OT Cancellation Note  Patient Details Name: Corey West MRN: [id number] DOB: 05/15/1951   Cancelled Treatment:    Reason Eval/Treat Not Completed: Medical issues which prohibited therapy  Pt with Hgb of 6.4 at this time. Upon speaking with RN, she states they are prepping for blood transfusion. Will withhold OT treatment at this time and f/u as pt becomes more appropriate for therapy participation. Thank you.  [name], [place], OTR/L ascom [phone] 02/04/20, 3:37 PM

## 2020-02-05 DIAGNOSIS — D649 Anemia, unspecified: Secondary | ICD-10-CM

## 2020-02-05 LAB — BPAM RBC
Blood Product Expiration Date: 202105132359
Blood Product Expiration Date: 202105222359
ISSUE DATE / TIME: 202105041342
ISSUE DATE / TIME: 202105061819
Unit Type and Rh: 600
Unit Type and Rh: 600

## 2020-02-05 LAB — PROTEIN ELECTROPHORESIS, SERUM
A/G Ratio: 1 (ref 0.7–1.7)
Albumin ELP: 2.9 g/dL (ref 2.9–4.4)
Alpha-1-Globulin: 0.2 g/dL (ref 0.0–0.4)
Alpha-2-Globulin: 0.6 g/dL (ref 0.4–1.0)
Beta Globulin: 0.8 g/dL (ref 0.7–1.3)
Gamma Globulin: 1.3 g/dL (ref 0.4–1.8)
Globulin, Total: 2.9 g/dL (ref 2.2–3.9)
Total Protein ELP: 5.8 g/dL — ABNORMAL LOW (ref 6.0–8.5)

## 2020-02-05 LAB — CBC WITH DIFFERENTIAL/PLATELET
Abs Immature Granulocytes: 0.04 10*3/uL (ref 0.00–0.07)
Basophils Absolute: 0 10*3/uL (ref 0.0–0.1)
Basophils Relative: 0 %
Eosinophils Absolute: 0 10*3/uL (ref 0.0–0.5)
Eosinophils Relative: 0 %
HCT: 23.1 % — ABNORMAL LOW (ref 39.0–52.0)
Hemoglobin: 7.4 g/dL — ABNORMAL LOW (ref 13.0–17.0)
Immature Granulocytes: 1 %
Lymphocytes Relative: 7 %
Lymphs Abs: 0.5 10*3/uL — ABNORMAL LOW (ref 0.7–4.0)
MCH: 29 pg (ref 26.0–34.0)
MCHC: 32 g/dL (ref 30.0–36.0)
MCV: 90.6 fL (ref 80.0–100.0)
Monocytes Absolute: 0.5 10*3/uL (ref 0.1–1.0)
Monocytes Relative: 8 %
Neutro Abs: 6 10*3/uL (ref 1.7–7.7)
Neutrophils Relative %: 84 %
Platelets: 95 10*3/uL — ABNORMAL LOW (ref 150–400)
RBC: 2.55 MIL/uL — ABNORMAL LOW (ref 4.22–5.81)
RDW: 14.6 % (ref 11.5–15.5)
WBC: 7.1 10*3/uL (ref 4.0–10.5)
nRBC: 0 % (ref 0.0–0.2)

## 2020-02-05 LAB — BASIC METABOLIC PANEL
Anion gap: 8 (ref 5–15)
BUN: 70 mg/dL — ABNORMAL HIGH (ref 8–23)
CO2: 24 mmol/L (ref 22–32)
Calcium: 8.4 mg/dL — ABNORMAL LOW (ref 8.9–10.3)
Chloride: 106 mmol/L (ref 98–111)
Creatinine, Ser: 3.27 mg/dL — ABNORMAL HIGH (ref 0.61–1.24)
GFR calc Af Amer: 21 mL/min — ABNORMAL LOW (ref >60)
GFR calc non Af Amer: 18 mL/min — ABNORMAL LOW (ref >60)
Glucose, Bld: 212 mg/dL — ABNORMAL HIGH (ref 70–99)
Potassium: 3.7 mmol/L (ref 3.5–5.1)
Sodium: 138 mmol/L (ref 135–145)

## 2020-02-05 LAB — TYPE AND SCREEN
ABO/RH(D): A NEG
Antibody Screen: NEGATIVE
Unit division: 0
Unit division: 0

## 2020-02-05 LAB — MAGNESIUM: Magnesium: 2 mg/dL (ref 1.7–2.4)

## 2020-02-05 LAB — GLUCOSE, CAPILLARY
Glucose-Capillary: 173 mg/dL — ABNORMAL HIGH (ref 70–99)
Glucose-Capillary: 253 mg/dL — ABNORMAL HIGH (ref 70–99)
Glucose-Capillary: 231 mg/dL — ABNORMAL HIGH (ref 70–99)
Glucose-Capillary: 249 mg/dL — ABNORMAL HIGH (ref 70–99)

## 2020-02-05 LAB — HEMOGLOBIN: Hemoglobin: 7.7 g/dL — ABNORMAL LOW (ref 13.0–17.0)

## 2020-02-05 MED ORDER — LACTULOSE 10 GM/15ML PO SOLN
30.0000 g | Freq: Two times a day (BID) | ORAL | Status: DC
  Administered 2020-02-05 – 2020-02-12 (×14): 30 g via ORAL
  Filled 2020-02-05 (×14): qty 60

## 2020-02-05 NOTE — Consult Note (Addendum)
[facility] Nurse Consult Note: Reason for Consult: Consult requested for bilat legs. Pt is familiar to the [facility] team from previous visits.  He has worn [employer] boots in the past and states his legs have greatly improved.  He is followed by the outpatient wound care center; [facility] boots were discontinued and they have recommended light compression to the RLE.   Wound type: Left leg with some edema; no open wounds or drainage Right leg with generalized erythremia and edema; no open wounds or drainage.  Applied kerlex and Coban to provide light compression; no topical treatment is indicated for left leg.  Dressing procedure/placement/frequency: Topical treatment orders provided for bedside nurses to perform Q day as follows:  Bedside nurse; please re-wrap right leg Q morning as follows to provide light compression:  Apply kerlex in a spiral fashion, beginning just behind toes to below knee, then ace wrap in the same manner.   Pt can resume follow-up with the outpatient wound care center after discharge.  Please re-consult if further assistance is needed.  Thank-you,  Julien Girt MSN, [place], [place], [place], [place]

## 2020-02-05 NOTE — Progress Notes (Signed)
Physical Therapy Treatment Patient Details Name: Corey West MRN: [id number] DOB: [birth year] Today's Date: 02/05/2020    History of Present Illness Pt. is a 69 y.o. male who was admitted to [facility] with LE edema, and scrotal pain. Pt. PMHx includes: Hepatic Cirrhosis, with espohageal Varices, portal HTN, gastropathy, IDDM Type 2, and CKD Stage 4, HTN, Hyperlipidemia, Chronic Venous status dermatitis, and Anxiety.    PT Comments    Pt seen as a co treat to maximize functional mobility, ADLs, education, and pt/therapist safety. Pt also stated he is more comfortable with two person assist. He performed supine to sit with significant use of bed rails and extended time, able to sit with good balance for several minutes due to SOB and to address RN and PT in room, as well as answer a phone call. Pt stood at EOB, and minAx2 to transfer to chair, pt impulsive and did not wait for RW. Sit <> Stand performed several more times during session and with RW, CGA, though pt did have deficits in standing balance without bilateral UE support (posterior lean noted). He ambulated ~30ft with RW and CGA, had an extended seated rest break, and then ambulated an additional 54ft (chair follow throughout for safety). Pt very SOB after each bout, spO2 monitored and desaturated only while walking and talking. The pt exhibited decreased step height/length and wide base of support, significantly decreased gait speed. The patient would benefit from further skilled PT intervention to continue to progress towards goals. Recommendation remains appropriate, though pt is eager to discharge home.     Follow Up Recommendations  SNF     Equipment Recommendations  None recommended by PT;Other (comment)(TBD at next venue of care)    Recommendations for Other Services       Precautions / Restrictions Precautions Precautions: Fall Restrictions Weight Bearing Restrictions: No    Mobility  Bed Mobility Overal bed mobility: Needs  Assistance Bed Mobility: Supine to Sit     Supine to sit: HOB elevated;Supervision     General bed mobility comments: significant time needed, heavy reliance on bed rails  Transfers Overall transfer level: Needs assistance Equipment used: None;Rolling walker (2 wheeled) Transfers: Sit to/from Stand Sit to Stand: Min assist;+2 safety/equipment;Min guard         General transfer comment: from EOB without AD (impulsive), minAx2 for safety. From recliner multiple times during session with RW and CGA, cueing for hand placement and eccentric control when returning to sitting  Ambulation/Gait Ambulation/Gait assistance: Min guard Gait Distance (Feet): (87ft and then 42ft after extended seated rest break) Assistive device: Rolling walker (2 wheeled) Gait Pattern/deviations: Step-through pattern Gait velocity: decreased   General Gait Details: Pt with wide base of support, decreased step height/length   Stairs             Wheelchair Mobility    Modified Rankin (Stroke Patients Only)       Balance Overall balance assessment: Needs assistance Sitting-balance support: No upper extremity supported Sitting balance-Leahy Scale: Good Sitting balance - Comments: close supervision while seated EOB     Standing balance-Leahy Scale: Poor                              Cognition Arousal/Alertness: Awake/alert Behavior During Therapy: WFL for tasks assessed/performed Overall Cognitive Status: Within Functional Limits for tasks assessed  General Comments: Pt is AOx4 but does appear to have some deficits with insight/judgement, very verbose      Exercises Other Exercises Other Exercises: Pt able to sit EOB for several minutes with good balance, SOB after transfer. convsersing with PT and RN in room pt able to weight shift at EOB without assistance. Other Exercises: SpO2 monitored continuously during session, with  exertion and speaking pt desaturated to 81%, cued for PLB and seated rest, returned to >90% within 30-40seconds. Second trial of ambulation pt cued for PLB and no desaturation noted. Other Exercises: Pt educated on importance of continued mobility including OOB to chair every day, and use of BSC/urinal as able.    General Comments        Pertinent Vitals/Pain Pain Assessment: Faces Faces Pain Scale: Hurts a little bit Pain Location: LEs in chair recliner, in standing Pain Descriptors / Indicators: Grimacing Pain Intervention(s): Limited activity within patient's tolerance;Monitored during session;Repositioned    Home Living                      Prior Function            PT Goals (current goals can now be found in the care plan section) Progress towards PT goals: Progressing toward goals    Frequency    Min 2X/week      PT Plan Current plan remains appropriate    Co-evaluation PT/OT/SLP Co-Evaluation/Treatment: Yes Reason for Co-Treatment: Necessary to address cognition/behavior during functional activity;To address functional/ADL transfers PT goals addressed during session: Mobility/safety with mobility;Proper use of DME;Balance OT goals addressed during session: ADL's and self-care;Proper use of Adaptive equipment and DME      AM-PAC PT "6 Clicks" Mobility   Outcome Measure  Help needed turning from your back to your side while in a flat bed without using bedrails?: A Little Help needed moving from lying on your back to sitting on the side of a flat bed without using bedrails?: A Little Help needed moving to and from a bed to a chair (including a wheelchair)?: A Little Help needed standing up from a chair using your arms (e.g., wheelchair or bedside chair)?: A Little Help needed to walk in hospital room?: A Little Help needed climbing 3-5 steps with a railing? : A Lot 6 Click Score: 17    End of Session Equipment Utilized During Treatment: Gait  belt Activity Tolerance: Patient tolerated treatment well Patient left: with chair alarm set;in chair Nurse Communication: Mobility status PT Visit Diagnosis: Unsteadiness on feet (R26.81);Muscle weakness (generalized) (M62.81);Difficulty in walking, not elsewhere classified (R26.2)     Time: 9357-0177 PT Time Calculation (min) (ACUTE ONLY): 46 min  Charges:  $Therapeutic Exercise: 23-37 mins                    Lieutenant Diego PT, DPT 11:43 AM,02/05/20

## 2020-02-05 NOTE — Consult Note (Signed)
Vonda Antigua, MD [address], [place], [place], [place], [address] [address], [place], [place], [place], [address] Phone: [phone]  Fax: [phone]  Consultation  Referring Provider:     Dr. Roosevelt Locks  Primary Care Physician:  System, Pcp Not In Reason for Consultation:    Primary GI: [facility]  Date of Admission:  01/28/2020 Date of Consultation:  02/05/2020         HPI:   Corey West is a 69 y.o. male, patient of Dr. [name], with history of cirrhosis due to history of alcohol abuse, and fatty liver admitted with acute on chronic CHF.  GI being consulted for anemia.  No active GI bleeding.  Patient has history of gave which was ablated in June 2020, portal hypertensive gastropathy.  Last EGD in June 2020 showed gave without bleeding and portal hypertensive gastropathy.  No varices.  Patient's hemoglobin dropped to 6.4 yesterday and is stable since transfusion yesterday.  Baseline is around 79.  Please note that this patient previously refused the consult yesterday as he only wanted to see Dr. [name] even though he was not available.  Today he changed his mind and wants to see our team.  Past Medical History:  Diagnosis Date  . Anemia   . Anxiety    controlled;   . Arthritis   . AVM (arteriovenous malformation) of stomach, acquired with hemorrhage   . Barrett's esophagus   . Chronic kidney disease    renal infufficiency  . Cirrhosis ([place])   . Depression    controlled;   Marland Kitchen Diabetes mellitus without complication ([place])    not controlled, taking insulin but sugar continues to run high;   . Edema   . Esophageal varices ([place])   . GAVE (gastric antral vascular ectasia)   . GERD (gastroesophageal reflux disease)   . History of hiatal hernia   . Hyperlipidemia   . Hypertension    controlled well;   . Liver cirrhosis ([place])   . Nephropathy, diabetic ([place])   . Obesity   . Pancytopenia ([place])   . Polyp, stomach    with chronic blood loss  . Sleep apnea    does not wear a cpap, Medicare would not pay for it   . Venous stasis dermatitis of both lower extremities   . Venous stasis of both lower extremities    with cellulitis    Past Surgical History:  Procedure Laterality Date  . ESOPHAGOGASTRODUODENOSCOPY N/A 02/09/2015   Procedure: ESOPHAGOGASTRODUODENOSCOPY (EGD);  Surgeon: Manya Silvas, MD;  Location: [facility] ENDOSCOPY;  Service: Endoscopy;  Laterality: N/A;  . ESOPHAGOGASTRODUODENOSCOPY N/A 07/22/2015   Procedure: ESOPHAGOGASTRODUODENOSCOPY (EGD);  Surgeon: Manya Silvas, MD;  Location: [facility] ENDOSCOPY;  Service: Endoscopy;  Laterality: N/A;  . ESOPHAGOGASTRODUODENOSCOPY N/A 06/26/2017   Procedure: ESOPHAGOGASTRODUODENOSCOPY (EGD);  Surgeon: Manya Silvas, MD;  Location: [facility] ENDOSCOPY;  Service: Endoscopy;  Laterality: N/A;  . ESOPHAGOGASTRODUODENOSCOPY N/A 06/27/2017   Procedure: ESOPHAGOGASTRODUODENOSCOPY (EGD);  Surgeon: Manya Silvas, MD;  Location: [facility] ENDOSCOPY;  Service: Endoscopy;  Laterality: N/A;  . ESOPHAGOGASTRODUODENOSCOPY N/A 06/28/2017   Procedure: ESOPHAGOGASTRODUODENOSCOPY (EGD);  Surgeon: Manya Silvas, MD;  Location: [facility] ENDOSCOPY;  Service: Endoscopy;  Laterality: N/A;  . ESOPHAGOGASTRODUODENOSCOPY Bilateral 10/11/2017   Procedure: ESOPHAGOGASTRODUODENOSCOPY (EGD);  Surgeon: Manya Silvas, MD;  Location: [facility] ENDOSCOPY;  Service: Endoscopy;  Laterality: Bilateral;  . ESOPHAGOGASTRODUODENOSCOPY N/A 12/04/2017   Procedure: ESOPHAGOGASTRODUODENOSCOPY (EGD);  Surgeon: Manya Silvas, MD;  Location: [facility] ENDOSCOPY;  Service: Endoscopy;  Laterality: N/A;  . ESOPHAGOGASTRODUODENOSCOPY N/A 03/06/2019   Procedure: ESOPHAGOGASTRODUODENOSCOPY (EGD);  Surgeon: Toledo, Benay Pike, MD;  Location: [facility] ENDOSCOPY;  Service: Gastroenterology;  Laterality: N/A;  . ESOPHAGOGASTRODUODENOSCOPY (EGD) WITH PROPOFOL N/A 07/20/2015   Procedure: ESOPHAGOGASTRODUODENOSCOPY (EGD) WITH PROPOFOL;  Surgeon: Manya Silvas, MD;   Location: [facility] ENDOSCOPY;  Service: Endoscopy;  Laterality: N/A;  . ESOPHAGOGASTRODUODENOSCOPY (EGD) WITH PROPOFOL N/A 09/16/2015   Procedure: ESOPHAGOGASTRODUODENOSCOPY (EGD) WITH PROPOFOL;  Surgeon: Manya Silvas, MD;  Location: [facility] ENDOSCOPY;  Service: Endoscopy;  Laterality: N/A;  . ESOPHAGOGASTRODUODENOSCOPY (EGD) WITH PROPOFOL N/A 03/16/2016   Procedure: ESOPHAGOGASTRODUODENOSCOPY (EGD) WITH PROPOFOL;  Surgeon: Manya Silvas, MD;  Location: [facility] ENDOSCOPY;  Service: Endoscopy;  Laterality: N/A;  . ESOPHAGOGASTRODUODENOSCOPY (EGD) WITH PROPOFOL N/A 09/14/2016   Procedure: ESOPHAGOGASTRODUODENOSCOPY (EGD) WITH PROPOFOL;  Surgeon: Manya Silvas, MD;  Location: [facility] ENDOSCOPY;  Service: Endoscopy;  Laterality: N/A;  . ESOPHAGOGASTRODUODENOSCOPY (EGD) WITH PROPOFOL N/A 11/05/2016   Procedure: ESOPHAGOGASTRODUODENOSCOPY (EGD) WITH PROPOFOL;  Surgeon: Manya Silvas, MD;  Location: [facility] ENDOSCOPY;  Service: Endoscopy;  Laterality: N/A;  . ESOPHAGOGASTRODUODENOSCOPY (EGD) WITH PROPOFOL N/A 02/06/2017   Procedure: ESOPHAGOGASTRODUODENOSCOPY (EGD) WITH PROPOFOL;  Surgeon: Manya Silvas, MD;  Location: [facility] ENDOSCOPY;  Service: Endoscopy;  Laterality: N/A;  . ESOPHAGOGASTRODUODENOSCOPY (EGD) WITH PROPOFOL N/A 04/24/2017   Procedure: ESOPHAGOGASTRODUODENOSCOPY (EGD) WITH PROPOFOL;  Surgeon: Manya Silvas, MD;  Location: [facility] ENDOSCOPY;  Service: Endoscopy;  Laterality: N/A;  . ESOPHAGOGASTRODUODENOSCOPY (EGD) WITH PROPOFOL N/A 07/31/2017   Procedure: ESOPHAGOGASTRODUODENOSCOPY (EGD) WITH PROPOFOL;  Surgeon: Manya Silvas, MD;  Location: [facility] ENDOSCOPY;  Service: Endoscopy;  Laterality: N/A;  . ESOPHAGOGASTRODUODENOSCOPY (EGD) WITH PROPOFOL N/A 08/08/2018   Procedure: ESOPHAGOGASTRODUODENOSCOPY (EGD) WITH PROPOFOL;  Surgeon: Manya Silvas, MD;  Location: [facility] ENDOSCOPY;  Service: Endoscopy;  Laterality: N/A;  . GIVENS CAPSULE STUDY N/A 12/05/2017   Procedure: GIVENS CAPSULE STUDY;   Surgeon: Manya Silvas, MD;  Location: [facility] ENDOSCOPY;  Service: Endoscopy;  Laterality: N/A;  . TONSILLECTOMY    . TONSILLECTOMY AND ADENOIDECTOMY    . ULNAR NERVE TRANSPOSITION    . UVULOPALATOPHARYNGOPLASTY      Prior to Admission medications   Medication Sig Start Date End Date Taking? Authorizing Provider  atorvastatin (LIPITOR) 10 MG tablet Take 2 tablets (20 mg total) by mouth daily at 6 PM. 12/17/19  Yes Nolberto Hanlon, MD  ferrous sulfate 325 (65 FE) MG tablet Take 325 mg by mouth 2 (two) times daily with a meal.    Yes [provider]  furosemide (LASIX) 40 MG tablet Take 80 mg by mouth 2 (two) times a day.    Yes [provider]  hydrALAZINE (APRESOLINE) 25 MG tablet Take 1 tablet (25 mg total) by mouth 3 (three) times daily. Patient taking differently: Take 25 mg by mouth in the morning and at bedtime.  12/17/19  Yes Nolberto Hanlon, MD  insulin aspart protamine - aspart (NOVOLOG 70/30 MIX) (70-30) 100 UNIT/ML FlexPen Inject 84 Units into the skin 2 (two) times daily.    Yes [provider]  lactulose (CHRONULAC) 10 GM/15ML solution Take 30 g by mouth 4 (four) times daily.    Yes [provider]  LORazepam (ATIVAN) 0.5 MG tablet Take 1 tablet (0.5 mg total) by mouth every 8 (eight) hours as needed for anxiety. 10/18/18  Yes Gladstone Lighter, MD  losartan (COZAAR) 25 MG tablet Take 25 mg by mouth daily. 01/07/20  Yes [provider]  Multiple Vitamin (MULTIVITAMIN) tablet Take 1 tablet by  mouth daily.   Yes [provider]  pantoprazole (PROTONIX) 40 MG tablet Take 1 tablet (40 mg total) by mouth 2 (two) times daily. 03/06/19 03/05/20 Yes Dustin Flock, MD  potassium chloride SA (KLOR-CON) 20 MEQ tablet Take 40 mEq by mouth 2 (two) times daily. 01/08/20  Yes [provider]  traMADol (ULTRAM) 50 MG tablet Take 50 mg by mouth every 6 (six) hours as needed for moderate pain.    Yes [provider]    Family History    Problem Relation Age of Onset  . Diabetes Other   . Transient ischemic attack Father   . CAD Father      Social History   Tobacco Use  . Smoking status: Former Smoker    Years: 20.00    Types: Cigars, Cigarettes  . Smokeless tobacco: Never Used  Substance Use Topics  . Alcohol use: No    Comment: stopped 15 years ago  . Drug use: No    Allergies as of 01/28/2020  . (No Known Allergies)    Review of Systems:    All systems reviewed and negative except where noted in HPI.   Physical Exam:  Vital signs in last 24 hours: Vitals:   02/04/20 2215 02/05/20 0304 02/05/20 0807 02/05/20 1150  BP: (!) 145/57 (!) 139/56 (!) 129/59 (!) 142/71  Pulse: 61 65 63 76  Resp:  20 19 19   Temp: 98 F (36.7 C) 98.3 F (36.8 C) (!) 97.4 F (36.3 C) 98.6 F (37 C)  TempSrc: Oral Oral    SpO2:  100% 100% 99%  Weight:  (!) 141.3 kg    Height:       Last BM Date: 02/04/20 General:   Pleasant, cooperative in NAD Head:  Normocephalic and atraumatic. Eyes:   No icterus.   Conjunctiva pink. PERRLA. Ears:  Normal auditory acuity. Neck:  Supple; no masses or thyroidomegaly Lungs: Respirations even and unlabored. Lungs clear to auscultation bilaterally.   No wheezes, crackles, or rhonchi.  Abdomen:  Soft, nondistended, nontender. Normal bowel sounds. No appreciable masses or hepatomegaly.  No rebound or guarding.  Neurologic:  Alert and oriented x3;  grossly normal neurologically. Skin:  Intact without significant lesions or rashes. Cervical Nodes:  No significant cervical adenopathy. Psych:  Alert and cooperative. Normal affect.  LAB RESULTS: Recent Labs    02/03/20 0421 02/03/20 0421 02/04/20 0411 02/04/20 1730 02/04/20 2311 02/05/20 0508 02/05/20 1114  WBC 9.3  --  8.1  --   --  7.1  --   HGB 7.8*   < > 6.4*   < > 7.6* 7.4* 7.7*  HCT 25.2*  --  21.1*  --   --  23.1*  --   PLT 108*  --  87*  --   --  95*  --    < > = values in this interval not displayed.   BMET Recent Labs     02/03/20 0421 02/04/20 0411 02/05/20 0508  NA 140 139 138  K 3.4* 3.8 3.7  CL 106 106 106  CO2 23 26 24   GLUCOSE 147* 198* 212*  BUN 48* 61* 70*  CREATININE 2.63* 3.30* 3.27*  CALCIUM 9.2 8.6* 8.4*   LFT No results for input(s): PROT, ALBUMIN, AST, ALT, ALKPHOS, BILITOT, BILIDIR, IBILI in the last 72 hours. PT/INR No results for input(s): LABPROT, INR in the last 72 hours.  STUDIES: No results found.    Impression / Plan:   Corey West is a  69 y.o. y/o male with acute on chronic CHF, history of cirrhosis due to previous history of alcohol abuse and fatty liver, with acute on chronic anemia with no active GI bleeding  Patient is on oxygen now, and is requiring IV Lasix  Given the absence of active GI bleeding, and ongoing acute issues as above, endoscopic procedures that require sedation and can cause changes in hemodynamics including hypoxia in the setting of ongoing acute CHF, would have higher risks than benefits at this time  Would recommend PPI IV twice daily Continue serial CBCs and transfuse as needed Avoid NSAIDs Maintain 2 large-bore IV lines  If active GI bleeding occurs or hemoglobin continues to drop, endoscopic procedures can be considered as an inpatient  Otherwise, if hemoglobin remains stable to no active GI bleeding occurs, patient should have close follow-up with Dr. Ricky Stabs team to consider repeat upper endoscopy  The likely source of his anemia is his underlying gave that may require repeat ablation  Iron level is low, would also recommend IV iron replacement as an inpatient  Thank you for involving me in the care of this patient.      LOS: 7 days   [name] Manifold, MD  02/05/2020, 3:33 PM

## 2020-02-05 NOTE — Progress Notes (Signed)
Inpatient Diabetes Program Recommendations  AACE/ADA: New Consensus Statement on Inpatient Glycemic Control (2015)  Target Ranges:  Prepandial:   less than 140 mg/dL      Peak postprandial:   less than 180 mg/dL (1-2 hours)      Critically ill patients:  140 - 180 mg/dL   Results for KHAIR, CHASTEEN (MRN [id number]) as of 02/05/2020 14:29  Ref. Range 02/04/2020 07:49 02/04/2020 11:11 02/04/2020 16:24 02/04/2020 22:38  Glucose-Capillary Latest Ref Range: 70 - 99 mg/dL 176 (H)  2 units NOVOLOG  171 (H)  2 units NOVOLOG  207 (H)  3 units NOVOLOG  259 (H)   Results for JUNO, BOZARD (MRN [id number]) as of 02/05/2020 14:29  Ref. Range 02/05/2020 08:07 02/05/2020 11:57  Glucose-Capillary Latest Ref Range: 70 - 99 mg/dL 173 (H)  2 units NOVOLOG  253 (H)  5 units NOVOLOG      Home DM Meds: Novolog 70/30 Mix- 84 units BID    Current Orders: Novolog Sensitive Correction Scale/ SSI (0-9 units) TID AC      MD- Please consider adding Novolog Meal Coverage:  Novolog 4 units TID with meals  (Please add the following Hold Parameters: Hold if pt eats <50% of meal, Hold if pt NPO)    --Will follow patient during hospitalization--  Wyn Quaker RN, MSN, CDE Diabetes Coordinator Inpatient Glycemic Control Team Team Pager: [phone] (8a-5p)

## 2020-02-05 NOTE — Progress Notes (Signed)
[facility], [place] 02/05/20  Subjective:   Hospital day # 7  Renal consult for volume overload and anasarca  Continues to have large amount of lower extremity edema Continued on IV furosemide infusion Currently on 3 L oxygen by nasal cannula Able to eat without nausea or vomiting    Objective:  Vital signs in last 24 hours:  Temp:  [97.4 F (36.3 C)-98.6 F (37 C)] 98.6 F (37 C) (05/07 1150) Pulse Rate:  [61-76] 76 (05/07 1150) Resp:  [18-20] 19 (05/07 1150) BP: (126-145)/(50-71) 142/71 (05/07 1150) SpO2:  [99 %-100 %] 99 % (05/07 1150) Weight:  [141.3 kg] 141.3 kg (05/07 0304)  Weight change: -1.225 kg Filed Weights   02/02/20 0500 02/04/20 0428 02/05/20 0304  Weight: (!) 143.3 kg (!) 142.5 kg (!) 141.3 kg    Intake/Output:    Intake/Output Summary (Last 24 hours) at 02/05/2020 1503 Last data filed at 02/05/2020 1012 Gross per 24 hour  Intake 836 ml  Output 1250 ml  Net -414 ml     Physical Exam: General: NAD, laying in bed,   HEENT Anicteric   Pulm/lungs Coarse breath sounds,Winter O2  CVS/Heart irregular  Abdomen:  Soft, NT  Extremities: ++ edema b.l,  Neurologic: Alert, oriented  Skin: Stasis dermatitis, right leg warmer than left, redness      External urinary catheter, rectal tube    Basic Metabolic Panel:  Recent Labs  Lab 01/30/20 0629 01/31/20 0628 02/01/20 0504 02/01/20 0504 02/02/20 0535 02/02/20 0535 02/03/20 0421 02/04/20 0411 02/05/20 0508  NA 143   < > 141  --  140  --  140 139 138  K 4.1   < > 3.7  --  3.5  --  3.4* 3.8 3.7  CL 111   < > 108  --  107  --  106 106 106  CO2 24   < > 25  --  25  --  23 26 24   GLUCOSE 163*   < > 148*  --  150*  --  147* 198* 212*  BUN 41*   < > 37*  --  44*  --  48* 61* 70*  CREATININE 2.57*   < > 2.43*  --  2.73*  --  2.63* 3.30* 3.27*  CALCIUM 9.2   < > 9.4   < > 9.3   < > 9.2 8.6* 8.4*  MG 2.0  --   --   --   --   --   --  1.9 2.0  PHOS 3.2  --   --   --   --   --    --   --   --    < > = values in this interval not displayed.     CBC: Recent Labs  Lab 02/02/20 0535 02/02/20 0535 02/02/20 1700 02/02/20 1700 02/03/20 0421 02/03/20 0421 02/04/20 0411 02/04/20 1730 02/04/20 2311 02/05/20 0508 02/05/20 1114  WBC 7.1  --   --   --  9.3  --  8.1  --   --  7.1  --   NEUTROABS  --   --   --   --   --   --  7.0  --   --  6.0  --   HGB 6.8*   < > 7.4*   < > 7.8*   < > 6.4* 7.4* 7.6* 7.4* 7.7*  HCT 22.1*  --  23.0*  --  25.2*  --  21.1*  --   --  23.1*  --   MCV 93.6  --   --   --  93.7  --  95.5  --   --  90.6  --   PLT 96*  --   --   --  108*  --  87*  --   --  95*  --    < > = values in this interval not displayed.      Lab Results  Component Value Date   HEPBSAG NON REACTIVE 01/30/2020   HEPBSAB NON REACTIVE 01/30/2020   HEPBIGM NON REACTIVE 01/30/2020      Microbiology:  Recent Results (from the past 240 hour(s))  Respiratory Panel by RT PCR (Flu A&B, Covid) - Nasopharyngeal Swab     Status: None   Collection Time: 01/28/20 10:34 PM   Specimen: Nasopharyngeal Swab  Result Value Ref Range Status   SARS Coronavirus 2 by RT PCR NEGATIVE NEGATIVE Final    Comment: (NOTE) SARS-CoV-2 target nucleic acids are NOT DETECTED. The SARS-CoV-2 RNA is generally detectable in upper respiratoy specimens during the acute phase of infection. The lowest concentration of SARS-CoV-2 viral copies this assay can detect is 131 copies/mL. A negative result does not preclude SARS-Cov-2 infection and should not be used as the sole basis for treatment or other patient management decisions. A negative result may occur with  improper specimen collection/handling, submission of specimen other than nasopharyngeal swab, presence of viral mutation(s) within the areas targeted by this assay, and inadequate number of viral copies (<131 copies/mL). A negative result must be combined with clinical observations, patient history, and epidemiological information.  The expected result is Negative. Fact Sheet for Patients:  PinkCheek.be Fact Sheet for Healthcare Providers:  GravelBags.it This test is not yet ap proved or cleared by the [place] FDA and  has been authorized for detection and/or diagnosis of SARS-CoV-2 by FDA under an Emergency Use Authorization (EUA). This EUA will remain  in effect (meaning this test can be used) for the duration of the COVID-19 declaration under Section 564(b)(1) of the Act, 21 U.S.C. section 360bbb-3(b)(1), unless the authorization is terminated or revoked sooner.    Influenza A by PCR NEGATIVE NEGATIVE Final   Influenza B by PCR NEGATIVE NEGATIVE Final    Comment: (NOTE) The Xpert Xpress SARS-CoV-2/FLU/RSV assay is intended as an aid in  the diagnosis of influenza from Nasopharyngeal swab specimens and  should not be used as a sole basis for treatment. Nasal washings and  aspirates are unacceptable for Xpert Xpress SARS-CoV-2/FLU/RSV  testing. Fact Sheet for Patients: PinkCheek.be Fact Sheet for Healthcare Providers: GravelBags.it This test is not yet approved or cleared by the [place] FDA and  has been authorized for detection and/or diagnosis of SARS-CoV-2 by  FDA under an Emergency Use Authorization (EUA). This EUA will remain  in effect (meaning this test can be used) for the duration of the  Covid-19 declaration under Section 564(b)(1) of the Act, 21  U.S.C. section 360bbb-3(b)(1), unless the authorization is  terminated or revoked. Performed at [facility], [address] [place], [place] [address]     Coagulation Studies: No results for input(s): LABPROT, INR in the last 72 hours.  Urinalysis: No results for input(s): COLORURINE, LABSPEC, PHURINE, GLUCOSEU, HGBUR, BILIRUBINUR, KETONESUR, PROTEINUR, UROBILINOGEN, NITRITE, LEUKOCYTESUR in the last 72  hours.  Invalid input(s): APPERANCEUR    Imaging: No results found.   Medications:   . furosemide (LASIX) infusion 8 mg/hr (02/05/20 0330)   . acetaminophen  650  mg Oral Once  . atorvastatin  20 mg Oral q1800  . hydrALAZINE  25 mg Oral TID  . insulin aspart  0-9 Units Subcutaneous TID WC  . iron polysaccharides  150 mg Oral Daily  . lactulose  30 g Oral BID  . pantoprazole (PROTONIX) IV  40 mg Intravenous Q12H  . sodium chloride flush  3 mL Intravenous Once  . 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  Not currently on ACE-I/ARB at home.  Chronic kidney disease secondary to diabetes, hypertension.  Acute renal failure secondary to acute cardiorenal syndrome 05/06 0701 - 05/07 0700 In: 0211 [P.O.:720; I.V.:152; Blood:300] Out: 1250 [Urine:1150; Stool:100]  Lab Results  Component Value Date   CREATININE 3.27 (H) 02/05/2020   CREATININE 3.30 (H) 02/04/2020   CREATININE 2.63 (H) 02/03/2020    #Cirrhosis with ascites and volume overload   #Anemia of chronic kidney disease and history of GI bleed. Thrombocytopenia.  Lab Results  Component Value Date   HGB 7.7 (L) 02/05/2020    # Diabetes mellitus type II with chronic kidney disease: insulin dependent. Lab Results  Component Value Date   HGBA1C 6.6 (H) 12/15/2019    Plan - continue furosemide drip - Continue  spironolactone - fluid restriction.  - IV albumin.  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 Any transcription errors are unintentional

## 2020-02-05 NOTE — Progress Notes (Signed)
Occupational Therapy Treatment Patient Details Name: Corey West MRN: [id number] DOB: 08-21-1951 Today's Date: 02/05/2020    History of present illness Pt. is a 69 y.o. male who was admitted to [facility] with LE edema, and scrotal pain. Pt. PMHx includes: Hepatic Cirrhosis, with espohageal Varices, portal HTN, gastropathy, IDDM Type 2, and CKD Stage 4, HTN, Hyperlipidemia, Chronic Venous status dermatitis, and Anxiety.   OT comments  Pt seen for OT tx this date to f/u re: safety with ADLs/fxl mobility/ADL transfers. Pt with some impulsivity throughout session. Pt is mostly cooperative throughout for therapy. Does resist somewhat in participating in peri care-states he can't but will be able to at home, OT encourages pt participation for which he requires MOD assist using sit to/from stand technique. OT also engages pt in teach-back method to confirm understanding of AE use of LB ADLs as pt struggles to don his slip on shoes at start of session. Pt confirms understanding and states he usually needs to be closer to a wall to don shoes. OT/PT engage pt in ADL transfers for which he primarily only needs physical assistance (MIN A to CGA) of 1 person, but 2 people required intermittently for safety as pt somewhat impulsive with attempts to stand. D/c recommendation updated to SNF for optimal safety as pt somewhat of a fall risk given decreased fxl activity tolerance (as evidenced by de-saturation with all attempts at activity participation-on RA seated 97%, drops to 87% with talking/ambulation, able to recover withing 30 secs of PLB/seated rest break), weakness, and decreased safety awareness.    Follow Up Recommendations  SNF;Supervision - Intermittent    Equipment Recommendations  Other (comment)(TBD-next venue)    Recommendations for Other Services      Precautions / Restrictions Precautions Precautions: Fall Restrictions Weight Bearing Restrictions: No       Mobility Bed Mobility Overal bed  mobility: Needs Assistance             General bed mobility comments: pt seated EOB with PT when OT presents  Transfers Overall transfer level: Needs assistance Equipment used: Rolling walker (2 wheeled) Transfers: Sit to/from Stand Sit to Stand: Min assist;+2 safety/equipment;Min guard         General transfer comment: impulsive, requires 2p assist for safety and chair follow for fxl mobility. Attempts multiple stands from EOB and chair w/o communicating with therapists and demos unsafe tehcnique. Requires verbal cues to reach back and control descent to chair.    Balance Overall balance assessment: Needs assistance Sitting-balance support: No upper extremity supported Sitting balance-Leahy Scale: Good Sitting balance - Comments: close supervision while seated EOB   Standing balance support: No upper extremity supported Standing balance-Leahy Scale: Poor Standing balance comment: Dyanmic standing balance poor, F static. Requires UE support at all times, minimally able to alternate hands off RW to engage in fxl task.                           ADL either performed or assessed with clinical judgement   ADL Overall ADL's : Needs assistance/impaired                             Toileting- Clothing Manipulation and Hygiene: Moderate assistance;Sit to/from stand Toileting - Clothing Manipulation Details (indicate cue type and reason): Pt with rectal tube at this time d/t persistant diarrhea, but is apparently leaking around the tube. OT engages pt in standing peri  care for which pt requires MOD A d/t primarily needing B UE support for standing balance on RW.       General ADL Comments: OT facilitates education review re: use of AE for LB ADLs to assess pt's knowledge and engages pt in teach back method. Pt adequately describes use of sock aide and reacher for socks, pants and underwear.     Vision Baseline Vision/History: Wears glasses Patient Visual  Report: No change from baseline     Perception     Praxis      Cognition Arousal/Alertness: Awake/alert Behavior During Therapy: WFL for tasks assessed/performed Overall Cognitive Status: Within Functional Limits for tasks assessed                                 General Comments: Pt is AOx4 but does appear to have some deficits with insight/judgement, very verbose        Exercises Other Exercises Other Exercises: AE education for LB ADLs (teach back method-see ADL section) Other Exercises: PLB/EC tehcniques for ADLs/fxl mobility   Shoulder Instructions       General Comments      Pertinent Vitals/ Pain       Pain Assessment: Faces Faces Pain Scale: Hurts a little bit Pain Location: LEs in chair recliner, in standing Pain Descriptors / Indicators: Grimacing Pain Intervention(s): Limited activity within patient's tolerance;Monitored during session;Repositioned  Home Living                                          Prior Functioning/Environment              Frequency  Min 2X/week        Progress Toward Goals  OT Goals(current goals can now be found in the care plan section)  Progress towards OT goals: Progressing toward goals  Acute Rehab OT Goals Patient Stated Goal: To return home OT Goal Formulation: With patient Time For Goal Achievement: 02/18/20 Potential to Achieve Goals: Good  Plan Discharge plan needs to be updated;Frequency needs to be updated    Co-evaluation    PT/OT/SLP Co-Evaluation/Treatment: Yes Reason for Co-Treatment: Necessary to address cognition/behavior during functional activity;To address functional/ADL transfers;Complexity of the patient's impairments (multi-system involvement) PT goals addressed during session: Mobility/safety with mobility;Balance;Proper use of DME OT goals addressed during session: ADL's and self-care;Proper use of Adaptive equipment and DME      AM-PAC OT "6 Clicks" Daily  Activity     Outcome Measure   Help from another person eating meals?: None Help from another person taking care of personal grooming?: None Help from another person toileting, which includes using toliet, bedpan, or urinal?: A Lot Help from another person bathing (including washing, rinsing, drying)?: A Lot Help from another person to put on and taking off regular upper body clothing?: A Little Help from another person to put on and taking off regular lower body clothing?: A Lot 6 Click Score: 17    End of Session Equipment Utilized During Treatment: Gait belt;Rolling walker  OT Visit Diagnosis: Muscle weakness (generalized) (M62.81)   Activity Tolerance Patient tolerated treatment well   Patient Left with call bell/phone within reach;in chair;with chair alarm set   Nurse Communication Mobility status        Time: 4782-9562 OT Time Calculation (min): 37 min  Charges: OT Treatments $  Self Care/Home Management : 8-22 mins  [name], MS, OTR/L ascom [phone] 02/05/20, 3:55 PM

## 2020-02-05 NOTE — Progress Notes (Signed)
PROGRESS NOTE    Corey West  TMA:[id number] DOB: 1951/01/02 DOA: 01/28/2020 PCP: System, Pcp Not In    Brief Narrative:  Corey West a 69 y.o.malewith medical history significant forhepatic cirrhosis with esophageal varices and portal hypertension gastropathy, insulin-dependent type 2 diabetes, CKD stage IV, hypertension, hyperlipidemia, chronic venous stasis dermatitis and edema bilateral lower extremities, anxiety, and obesity who presents to the ED for evaluation of progressive volume overload.  Patient was seen by nephrology, was placed on Lasix IV drip for volume overload. Patient also had some visual hallucination with mild elevation of ammonia level.  5/6.  Patient hemoglobin dropped down to 6.4, received a unit of transfusion.  Consult GI.   Assessment & Plan:   Principal Problem:   Volume overload Active Problems:   Cirrhosis (HCC)   Thrombocytopenia (HCC)   Chronic venous stasis dermatitis of both lower extremities   Hypertension associated with diabetes ([place])   Type 2 diabetes mellitus with diabetic nephropathy, with long-term current use of insulin (HCC)   Acute on chronic diastolic CHF (congestive heart failure) (HCC)   CKD (chronic kidney disease) stage 4, GFR 15-29 ml/min (HCC)   Hyperlipidemia associated with type 2 diabetes mellitus ([place])   Acute hypoxemic respiratory failure ([place])  #1.  Acute on chronic anemia. Patient did not have any rectal bleeding.  He has some loose stools from taking lactulose.  Does not appear to be bleeding.  He received a 1 unit PRBC, hemoglobin appropriately increased to 7.7 now.  Patient had refused to see Dr.Tahiliani at the GI consult.  I have reached out to [facility] gastroenterology, and I spoke with Dr.Toledo's nurse.  She will make sure that Dr. [name] see the patient today.  #2.  Acute hypoxemic restaurant failure. Secondary to acute on chronic diastolic congestive heart failure.  Condition much improved.  Still requiring  oxygen.  He is still on Lasix drip.  3.  Acute on chronic diastolic congestive heart failure. As above  4.  Liver cirrhosis with hepatic encephalopathy. Patient mental status has improved, I will decrease lactulose to twice a day.  5.  Chronic kidney disease stage IV. Renal function more stable today.  Followed by nephrology.  6.  Type 2 diabetes. Continue sliding scale insulin.  7.  Atrial fibrillation. Patient is not a candidate for anticoagulation due to concern of bleeding.  8.  Thrombocytopenia.  Stable.   DVT prophylaxis:SCDS Code Status:Full code Family Communication:None Disposition Plan:  Patient came from:  Anticipated d/c place:  Barriers to d/c OR conditions which need to be met to effect a safe d/c: Not ready for discharge.  Consultants:  Nephrology    Procedures:None Antimicrobials:None  Subjective: Patient feels better today.  Also still on oxygen, short of breath has improved.  No cough. Has some loose stools due to lactulose. Denies any fever chills.  Objective: Vitals:   02/04/20 2215 02/05/20 0304 02/05/20 0807 02/05/20 1150  BP: (!) 145/57 (!) 139/56 (!) 129/59 (!) 142/71  Pulse: 61 65 63 76  Resp:  _0 Temp: 98 F (36.7 C) 98.3 F (36.8 C) (!) 97.4 F (36.3 C) 98.6 F (37 C)  TempSrc: Oral Oral    SpO2:  100% 100% 99%  Weight:  (!) 141.3 kg    Height:        Intake/Output Summary (Last 24 hours) at 02/05/2020 1307 Last data filed at 02/05/2020 1012 Gross per 24 hour  Intake 1108.01 ml  Output 1250 ml  Net -141.99  ml   Filed Weights   02/02/20 0500 02/04/20 0428 02/05/20 0304  Weight: (!) 143.3 kg (!) 142.5 kg (!) 141.3 kg    Examination:  General exam: Appears calm and comfortable  Respiratory system: Coarse breathing sounds to auscultation. Respiratory effort normal. Cardiovascular  system: Irregularly irregular. No JVD, murmurs, rubs, gallops or clicks. 2+ pedal edema. Gastrointestinal system: Abdomen is nondistended, soft and nontender. No organomegaly or masses felt. Normal bowel sounds heard. Central nervous system: Alert and oriented. No focal neurological deficits. Extremities: Symmetric 5 x 5 power. Skin: No rashes, lesions or ulcers Psychiatry: Judgement and insight appear normal. Mood & affect appropriate.     Data Reviewed: I have personally reviewed following labs and imaging studies  CBC: Recent Labs  Lab 02/02/20 0535 02/02/20 0535 02/02/20 1700 02/02/20 1700 02/03/20 0421 02/03/20 0421 02/04/20 0411 02/04/20 1730 02/04/20 2311 02/05/20 0508 02/05/20 1114  WBC 7.1  --   --   --  9.3  --  8.1  --   --  7.1  --   NEUTROABS  --   --   --   --   --   --  7.0  --   --  6.0  --   HGB 6.8*   < > 7.4*   < > 7.8*   < > 6.4* 7.4* 7.6* 7.4* 7.7*  HCT 22.1*  --  23.0*  --  25.2*  --  21.1*  --   --  23.1*  --   MCV 93.6  --   --   --  93.7  --  95.5  --   --  90.6  --   PLT 96*  --   --   --  108*  --  87*  --   --  95*  --    < > = values in this interval not displayed.   Basic Metabolic Panel: Recent Labs  Lab 01/30/20 0629 01/31/20 3500 02/01/20 0504 02/02/20 0535 02/03/20 0421 02/04/20 0411 02/05/20 0508  NA 143   < > 141 140 140 139 138  K 4.1   < > 3.7 3.5 3.4* 3.8 3.7  CL 111   < > 108 107 106 106 106  CO2 24   < > _0 GLUCOSE 163*   < > 148* 150* 147* 198* 212*  BUN 41*   < > 37* 44* 48* 61* 70*  CREATININE 2.57*   < > 2.43* 2.73* 2.63* 3.30* 3.27*  CALCIUM 9.2   < > 9.4 9.3 9.2 8.6* 8.4*  MG 2.0  --   --   --   --  1.9 2.0  PHOS 3.2  --   --   --   --   --   --    < > = values in this interval not displayed.   GFR: Estimated Creatinine Clearance: 30.2 mL/min (A) (by C-G formula based on SCr of 3.27 mg/dL (H)). Liver Function Tests: Recent Labs  Lab 01/30/20 0629  AST 22  ALT 20  ALKPHOS 64  BILITOT 0.7  PROT  6.5  ALBUMIN 3.2*   No results for input(s): LIPASE, AMYLASE in the last 168 hours. Recent Labs  Lab 02/02/20 0910  AMMONIA 56*   Coagulation Profile: No results for input(s): INR, PROTIME in the last 168 hours. Cardiac Enzymes: No results for input(s): CKTOTAL, CKMB, CKMBINDEX, TROPONINI in the last 168 hours. BNP (last 3 results) No results for input(s): PROBNP in  the last 8760 hours. HbA1C: No results for input(s): HGBA1C in the last 72 hours. CBG: Recent Labs  Lab 02/04/20 1111 02/04/20 1624 02/04/20 2238 02/05/20 0807 02/05/20 1157  GLUCAP 171* 207* 259* 173* 253*   Lipid Profile: No results for input(s): CHOL, HDL, LDLCALC, TRIG, CHOLHDL, LDLDIRECT in the last 72 hours. Thyroid Function Tests: No results for input(s): TSH, T4TOTAL, FREET4, T3FREE, THYROIDAB in the last 72 hours. Anemia Panel: No results for input(s): VITAMINB12, FOLATE, FERRITIN, TIBC, IRON, RETICCTPCT in the last 72 hours. Sepsis Labs: No results for input(s): PROCALCITON, LATICACIDVEN in the last 168 hours.  Recent Results (from the past 240 hour(s))  Respiratory Panel by RT PCR (Flu A&B, Covid) - Nasopharyngeal Swab     Status: None   Collection Time: 01/28/20 10:34 PM   Specimen: Nasopharyngeal Swab  Result Value Ref Range Status   SARS Coronavirus 2 by RT PCR NEGATIVE NEGATIVE Final    Comment: (NOTE) SARS-CoV-2 target nucleic acids are NOT DETECTED. The SARS-CoV-2 RNA is generally detectable in upper respiratoy specimens during the acute phase of infection. The lowest concentration of SARS-CoV-2 viral copies this assay can detect is 131 copies/mL. A negative result does not preclude SARS-Cov-2 infection and should not be used as the sole basis for treatment or other patient management decisions. A negative result may occur with  improper specimen collection/handling, submission of specimen other than nasopharyngeal swab, presence of viral mutation(s) within the areas targeted by this  assay, and inadequate number of viral copies (<131 copies/mL). A negative result must be combined with clinical observations, patient history, and epidemiological information. The expected result is Negative. Fact Sheet for Patients:  PinkCheek.be Fact Sheet for Healthcare Providers:  GravelBags.it This test is not yet ap proved or cleared by the [place] FDA and  has been authorized for detection and/or diagnosis of SARS-CoV-2 by FDA under an Emergency Use Authorization (EUA). This EUA will remain  in effect (meaning this test can be used) for the duration of the COVID-19 declaration under Section 564(b)(1) of the Act, 21 U.S.C. section 360bbb-3(b)(1), unless the authorization is terminated or revoked sooner.    Influenza A by PCR NEGATIVE NEGATIVE Final   Influenza B by PCR NEGATIVE NEGATIVE Final    Comment: (NOTE) The Xpert Xpress SARS-CoV-2/FLU/RSV assay is intended as an aid in  the diagnosis of influenza from Nasopharyngeal swab specimens and  should not be used as a sole basis for treatment. Nasal washings and  aspirates are unacceptable for Xpert Xpress SARS-CoV-2/FLU/RSV  testing. Fact Sheet for Patients: PinkCheek.be Fact Sheet for Healthcare Providers: GravelBags.it This test is not yet approved or cleared by the [place] FDA and  has been authorized for detection and/or diagnosis of SARS-CoV-2 by  FDA under an Emergency Use Authorization (EUA). This EUA will remain  in effect (meaning this test can be used) for the duration of the  Covid-19 declaration under Section 564(b)(1) of the Act, 21  U.S.C. section 360bbb-3(b)(1), unless the authorization is  terminated or revoked. Performed at [facility], [address], [place], [place] [address]          Radiology Studies: US Venous Img Lower Bilateral (DVT)  Result Date:  02/03/2020 CLINICAL DATA:  Redness and swelling of the bilateral lower extremities EXAM: BILATERAL LOWER EXTREMITY VENOUS DOPPLER ULTRASOUND TECHNIQUE: Gray-scale sonography with graded compression, as well as color Doppler and duplex ultrasound were performed to evaluate the lower extremity deep venous systems from the level of the common femoral  vein and including the common femoral, femoral, profunda femoral, popliteal and calf veins including the posterior tibial, peroneal and gastrocnemius veins when visible. The superficial great saphenous vein was also interrogated. Spectral Doppler was utilized to evaluate flow at rest and with distal augmentation maneuvers in the common femoral, femoral and popliteal veins. COMPARISON:  None. FINDINGS: RIGHT LOWER EXTREMITY Common Femoral Vein: No evidence of thrombus. Normal compressibility, respiratory phasicity and response to augmentation. Saphenofemoral Junction: No evidence of thrombus. Normal compressibility and flow on color Doppler imaging. Profunda Femoral Vein: No evidence of thrombus. Normal compressibility and flow on color Doppler imaging. Femoral Vein: No evidence of thrombus. Normal compressibility, respiratory phasicity and response to augmentation. Popliteal Vein: No evidence of thrombus. Normal compressibility, respiratory phasicity and response to augmentation. Calf Veins: No evidence of thrombus. Normal compressibility and flow on color Doppler imaging. Superficial Great Saphenous Vein: No evidence of thrombus. Normal compressibility. Venous Reflux:  None. Other Findings:  None. LEFT LOWER EXTREMITY Common Femoral Vein: No evidence of thrombus. Normal compressibility, respiratory phasicity and response to augmentation. Saphenofemoral Junction: No evidence of thrombus. Normal compressibility and flow on color Doppler imaging. Profunda Femoral Vein: No evidence of thrombus. Normal compressibility and flow on color Doppler imaging. Femoral Vein: No evidence  of thrombus. Normal compressibility, respiratory phasicity and response to augmentation. Popliteal Vein: No evidence of thrombus. Normal compressibility, respiratory phasicity and response to augmentation. Calf Veins: No evidence of thrombus. Normal compressibility and flow on color Doppler imaging. Superficial Great Saphenous Vein: No evidence of thrombus. Normal compressibility. Venous Reflux:  None. Other Findings:  None. IMPRESSION: No evidence of deep venous thrombosis in either lower extremity. Electronically Signed   By: Jacqulynn Cadet M.D.   On: 02/03/2020 14:13        Scheduled Meds:  acetaminophen  650 mg Oral Once   atorvastatin  20 mg Oral q1800   hydrALAZINE  25 mg Oral TID   insulin aspart  0-9 Units Subcutaneous TID WC   iron polysaccharides  150 mg Oral Daily   lactulose  30 g Oral BID   pantoprazole (PROTONIX) IV  40 mg Intravenous Q12H   sodium chloride flush  3 mL Intravenous Once   spironolactone  25 mg Oral Daily   Continuous Infusions:  furosemide (LASIX) infusion 8 mg/hr (02/05/20 0330)     LOS: 7 days    Time spent: 35 minutes    Sharen Hones, MD [facility]   To contact the attending provider between 7A-7P or the covering provider during after hours 7P-7A, please log into the web site [email or web address] and access using universal [place] password for that web site. If you do not have the password, please call the hospital operator.  02/05/2020, 1:07 PM

## 2020-02-06 LAB — GLUCOSE, CAPILLARY
Glucose-Capillary: 181 mg/dL — ABNORMAL HIGH (ref 70–99)
Glucose-Capillary: 173 mg/dL — ABNORMAL HIGH (ref 70–99)
Glucose-Capillary: 163 mg/dL — ABNORMAL HIGH (ref 70–99)
Glucose-Capillary: 154 mg/dL — ABNORMAL HIGH (ref 70–99)

## 2020-02-06 LAB — BASIC METABOLIC PANEL
Anion gap: 9 (ref 5–15)
BUN: 74 mg/dL — ABNORMAL HIGH (ref 8–23)
CO2: 23 mmol/L (ref 22–32)
Calcium: 8.7 mg/dL — ABNORMAL LOW (ref 8.9–10.3)
Chloride: 106 mmol/L (ref 98–111)
Creatinine, Ser: 3.18 mg/dL — ABNORMAL HIGH (ref 0.61–1.24)
GFR calc Af Amer: 22 mL/min — ABNORMAL LOW (ref >60)
GFR calc non Af Amer: 19 mL/min — ABNORMAL LOW (ref >60)
Glucose, Bld: 199 mg/dL — ABNORMAL HIGH (ref 70–99)
Potassium: 3.5 mmol/L (ref 3.5–5.1)
Sodium: 138 mmol/L (ref 135–145)

## 2020-02-06 LAB — CBC WITH DIFFERENTIAL/PLATELET
Abs Immature Granulocytes: 0.03 10*3/uL (ref 0.00–0.07)
Basophils Absolute: 0 10*3/uL (ref 0.0–0.1)
Basophils Relative: 0 %
Eosinophils Absolute: 0 10*3/uL (ref 0.0–0.5)
Eosinophils Relative: 0 %
HCT: 22.8 % — ABNORMAL LOW (ref 39.0–52.0)
Hemoglobin: 7.4 g/dL — ABNORMAL LOW (ref 13.0–17.0)
Immature Granulocytes: 1 %
Lymphocytes Relative: 6 %
Lymphs Abs: 0.3 10*3/uL — ABNORMAL LOW (ref 0.7–4.0)
MCH: 28.9 pg (ref 26.0–34.0)
MCHC: 32.5 g/dL (ref 30.0–36.0)
MCV: 89.1 fL (ref 80.0–100.0)
Monocytes Absolute: 0.5 10*3/uL (ref 0.1–1.0)
Monocytes Relative: 8 %
Neutro Abs: 4.9 10*3/uL (ref 1.7–7.7)
Neutrophils Relative %: 85 %
Platelets: 108 10*3/uL — ABNORMAL LOW (ref 150–400)
RBC: 2.56 MIL/uL — ABNORMAL LOW (ref 4.22–5.81)
RDW: 14.6 % (ref 11.5–15.5)
WBC: 5.8 10*3/uL (ref 4.0–10.5)
nRBC: 0 % (ref 0.0–0.2)

## 2020-02-06 MED ORDER — POTASSIUM CHLORIDE 20 MEQ PO PACK
40.0000 meq | PACK | Freq: Once | ORAL | Status: AC
  Administered 2020-02-06: 09:00:00 40 meq via ORAL
  Filled 2020-02-06: qty 2

## 2020-02-06 NOTE — Progress Notes (Signed)
[place], [place] 02/06/20  Subjective:   Hospital day # 8  Renal function slowly improving. Creatinine down to 3.18. Still on Lasix drip.  Objective:  Vital signs in last 24 hours:  Temp:  [98.1 F (36.7 C)-98.6 F (37 C)] 98.4 F (36.9 C) (05/08 1549) Pulse Rate:  [68-75] 68 (05/08 1549) Resp:  [17-20] 20 (05/08 1549) BP: (144-160)/(54-58) 153/58 (05/08 1549) SpO2:  [95 %-100 %] 96 % (05/08 1549) Weight:  [139.9 kg] 139.9 kg (05/08 0453)  Weight change: -1.406 kg Filed Weights   02/04/20 0428 02/05/20 0304 02/06/20 0453  Weight: (!) 142.5 kg (!) 141.3 kg (!) 139.9 kg    Intake/Output:    Intake/Output Summary (Last 24 hours) at 02/06/2020 1848 Last data filed at 02/06/2020 1754 Gross per 24 hour  Intake 340 ml  Output 1200 ml  Net -860 ml     Physical Exam: General: NAD, laying in bed  HEENT Anicteric   Pulm/lungs basilar rales, normal effort  CVS/Heart irregular  Abdomen:  Soft, NTND  Extremities: 2+ lower extremity edema  Neurologic: Alert, oriented, follows commands  Skin: Stasis dermatitis bilateral lower extremities  GU: External urinary catheter       Basic Metabolic Panel:  Recent Labs  Lab 02/02/20 0535 02/02/20 0535 02/03/20 0421 02/03/20 0421 02/04/20 0411 02/05/20 0508 02/06/20 0528  NA 140  --  140  --  139 138 138  K 3.5  --  3.4*  --  3.8 3.7 3.5  CL 107  --  106  --  106 106 106  CO2 25  --  23  --  26 24 23   GLUCOSE 150*  --  147*  --  198* 212* 199*  BUN 44*  --  48*  --  61* 70* 74*  CREATININE 2.73*  --  2.63*  --  3.30* 3.27* 3.18*  CALCIUM 9.3   < > 9.2   < > 8.6* 8.4* 8.7*  MG  --   --   --   --  1.9 2.0  --    < > = values in this interval not displayed.     CBC: Recent Labs  Lab 02/02/20 0535 02/02/20 0535 02/02/20 1700 02/02/20 1700 02/03/20 0421 02/03/20 0421 02/04/20 0411 02/04/20 0411 02/04/20 1730 02/04/20 2311 02/05/20 0508 02/05/20 1114 02/06/20 0528  WBC 7.1  --    --   --  9.3  --  8.1  --   --   --  7.1  --  5.8  NEUTROABS  --   --   --   --   --   --  7.0  --   --   --  6.0  --  4.9  HGB 6.8*   < > 7.4*   < > 7.8*   < > 6.4*   < > 7.4* 7.6* 7.4* 7.7* 7.4*  HCT 22.1*   < > 23.0*  --  25.2*  --  21.1*  --   --   --  23.1*  --  22.8*  MCV 93.6  --   --   --  93.7  --  95.5  --   --   --  90.6  --  89.1  PLT 96*  --   --   --  108*  --  87*  --   --   --  95*  --  108*   < > = values in this interval not displayed.  Lab Results  Component Value Date   HEPBSAG NON REACTIVE 01/30/2020   HEPBSAB NON REACTIVE 01/30/2020   HEPBIGM NON REACTIVE 01/30/2020      Microbiology:  Recent Results (from the past 240 hour(s))  Respiratory Panel by RT PCR (Flu A&B, Covid) - Nasopharyngeal Swab     Status: None   Collection Time: 01/28/20 10:34 PM   Specimen: Nasopharyngeal Swab  Result Value Ref Range Status   SARS Coronavirus 2 by RT PCR NEGATIVE NEGATIVE Final    Comment: (NOTE) SARS-CoV-2 target nucleic acids are NOT DETECTED. The SARS-CoV-2 RNA is generally detectable in upper respiratoy specimens during the acute phase of infection. The lowest concentration of SARS-CoV-2 viral copies this assay can detect is 131 copies/mL. A negative result does not preclude SARS-Cov-2 infection and should not be used as the sole basis for treatment or other patient management decisions. A negative result may occur with  improper specimen collection/handling, submission of specimen other than nasopharyngeal swab, presence of viral mutation(s) within the areas targeted by this assay, and inadequate number of viral copies (<131 copies/mL). A negative result must be combined with clinical observations, patient history, and epidemiological information. The expected result is Negative. Fact Sheet for Patients:  PinkCheek.be Fact Sheet for Healthcare Providers:  GravelBags.it This test is not yet ap proved  or cleared by the [place] FDA and  has been authorized for detection and/or diagnosis of SARS-CoV-2 by FDA under an Emergency Use Authorization (EUA). This EUA will remain  in effect (meaning this test can be used) for the duration of the COVID-19 declaration under Section 564(b)(1) of the Act, 21 U.S.C. section 360bbb-3(b)(1), unless the authorization is terminated or revoked sooner.    Influenza A by PCR NEGATIVE NEGATIVE Final   Influenza B by PCR NEGATIVE NEGATIVE Final    Comment: (NOTE) The Xpert Xpress SARS-CoV-2/FLU/RSV assay is intended as an aid in  the diagnosis of influenza from Nasopharyngeal swab specimens and  should not be used as a sole basis for treatment. Nasal washings and  aspirates are unacceptable for Xpert Xpress SARS-CoV-2/FLU/RSV  testing. Fact Sheet for Patients: PinkCheek.be Fact Sheet for Healthcare Providers: GravelBags.it This test is not yet approved or cleared by the [place] FDA and  has been authorized for detection and/or diagnosis of SARS-CoV-2 by  FDA under an Emergency Use Authorization (EUA). This EUA will remain  in effect (meaning this test can be used) for the duration of the  Covid-19 declaration under Section 564(b)(1) of the Act, 21  U.S.C. section 360bbb-3(b)(1), unless the authorization is  terminated or revoked. Performed at [facility], [address] [place], [place] [address]     Coagulation Studies: No results for input(s): LABPROT, INR in the last 72 hours.  Urinalysis: No results for input(s): COLORURINE, LABSPEC, PHURINE, GLUCOSEU, HGBUR, BILIRUBINUR, KETONESUR, PROTEINUR, UROBILINOGEN, NITRITE, LEUKOCYTESUR in the last 72 hours.  Invalid input(s): APPERANCEUR    Imaging: No results found.   Medications:   . furosemide (LASIX) infusion 8 mg/hr (02/06/20 1725)   . acetaminophen  650 mg Oral Once  . atorvastatin  20 mg Oral q1800  .  hydrALAZINE  25 mg Oral TID  . insulin aspart  0-9 Units Subcutaneous TID WC  . iron polysaccharides  150 mg Oral Daily  . lactulose  30 g Oral BID  . pantoprazole (PROTONIX) IV  40 mg Intravenous Q12H  . sodium chloride flush  3 mL Intravenous Once  . 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  Not currently on ACE-I/ARB at home.  Chronic kidney disease secondary to diabetes, hypertension.  Acute renal failure secondary to acute cardiorenal syndrome 05/07 0701 - 05/08 0700 In: 779.7 [P.O.:480; I.V.:199.7] Out: 1300 [Urine:1100; Stool:200]  Lab Results  Component Value Date   CREATININE 3.18 (H) 02/06/2020   CREATININE 3.27 (H) 02/05/2020   CREATININE 3.30 (H) 02/04/2020    #Cirrhosis with ascites and volume overload   #Anemia of chronic kidney disease and history of GI bleed. Thrombocytopenia.  Lab Results  Component Value Date   HGB 7.4 (L) 02/06/2020    # Diabetes mellitus type II with chronic kidney disease: insulin dependent. Lab Results  Component Value Date   HGBA1C 6.6 (H) 12/15/2019    Plan -Patient's renal function has improved a bit as creatinine down to 3.18.  Urine output was 1.6 L over the preceding 24 hours.  Therefore maintain the patient on furosemide drip.  No immediate need for dialysis as diuresis appears to be working.  Hemoglobin low at 7.4.  Holding off on  Epogen given possible pancreatic mass.  Consider blood transfusion for hemoglobin of 7 or less.  Otherwise glycemic control as per hospitalist.  Further plan as patient progresses.     LOS: 8 Tytionna Cloyd 5/8/20216:48 PM  [employer] [place]  Note: This note was prepared with Dragon dictation. Any transcription errors are unintentional

## 2020-02-06 NOTE — Progress Notes (Signed)
PROGRESS NOTE    Corey West  QMV:[id number] DOB: 05/12/1951 DOA: 01/28/2020 PCP: System, Pcp Not In (Confirm with patient/family/NH records and if not entered, this HAS to be entered at [facility] point of entry. "No PCP" if truly none.)   Brief Narrative: (Start on day 1 of progress note - keep it brief and live) [employer] a 69 y.o.malewith medical history significant forhepatic cirrhosis with esophageal varices and portal hypertension gastropathy, insulin-dependent type 2 diabetes, CKD stage IV, hypertension, hyperlipidemia, chronic venous stasis dermatitis and edema bilateral lower extremities, anxiety, and obesity who presents to the ED for evaluation of progressive volume overload.  Patient was seen by nephrology, was placed on Lasix IV drip for volume overload. Patient also had some visual hallucination with mild elevation of ammonia level.  5/6.  Patient hemoglobin dropped down to 6.4, received a unit of transfusion.  Consult GI.   Assessment & Plan:   Principal Problem:   Volume overload Active Problems:   Cirrhosis (HCC)   Thrombocytopenia (HCC)   Chronic venous stasis dermatitis of both lower extremities   Hypertension associated with diabetes ([place])   Type 2 diabetes mellitus with diabetic nephropathy, with long-term current use of insulin (HCC)   Acute on chronic diastolic CHF (congestive heart failure) (HCC)   CKD (chronic kidney disease) stage 4, GFR 15-29 ml/min (HCC)   Hyperlipidemia associated with type 2 diabetes mellitus ([place])   Acute hypoxemic respiratory failure ([place])  #1.  Acute on chronic anemia. Patient has been seen by gastroenterology, no evidence of bleeding at this time.  Continue monitor hemoglobin, transfuse as needed.  2.  Acute on chronic hypoxemic respiratory failure. Secondary to acute on chronic diastolic congestive heart failure.  Oxygenation gradually improving.  Continue Lasix drip.  3.  Acute on chronic diastolic congestive heart  failure. Patient still has evidence of volume overload.  Continue IV Lasix drip.  4.  Liver cirrhosis with hepatic encephalopathy. Mental status has improved.  5.  Chronic kidney disease stage IV. Function improving on Lasix.  Continue to follow.  6.  Type 2 diabetes. Continue sliding scale insulin.  7.  Atrial fibrillation. Not a candidate for anticoagulation.  Heart rate under control.  8.  Thrombocytopenia. Secondary to liver cirrhosis.  Stable.  9.  Morbid obesity.   DVT prophylaxis:SCDS Code Status:Full code Family Communication:None Disposition Plan:  Patient came from:  Anticipated d/c place:  Barriers to d/c OR conditions which need to be met to effect a safe d/c: Not ready for discharge.  Consultants:  Nephrology  Gastroenterology  Procedures:None Antimicrobials:None   Subjective: Patient short of breath is better.  Currently on 2-1/2 L oxygen.  No cough. He has no nausea this morning, received IV Zofran.  Still has some loose stools from lactulose. No fever or chills.  Objective: Vitals:   02/05/20 1800 02/05/20 1929 02/06/20 0453 02/06/20 0758  BP:  (!) 160/55 (!) 153/54 (!) 144/57  Pulse:  72 75 73  Resp: (!) '22 19 19 17  '$ Temp:  98.1 F (36.7 C) 98.6 F (37 C) 98.3 F (36.8 C)  TempSrc:  Oral Oral Oral  SpO2:  100% 100% 100%  Weight:   (!) 139.9 kg   Height:        Intake/Output Summary (Last 24 hours) at 02/06/2020 0819 Last data filed at 02/06/2020 0524 Gross per 24 hour  Intake 779.74 ml  Output 1100 ml  Net -320.26 ml   Filed Weights   02/04/20 0428 02/05/20 0304 02/06/20  1638  Weight: (!) 142.5 kg (!) 141.3 kg (!) 139.9 kg    Examination:  General exam: Appears calm and comfortable  Respiratory system: Coarse breathing sounds. Respiratory effort normal. Cardiovascular system: Irregularly irregular  no JVD, murmurs, rubs, gallops or clicks. 2+ pedal edema. Gastrointestinal system: Abdomen is nondistended, soft and nontender. No organomegaly or masses felt. Normal bowel sounds heard. Central nervous system: Alert and oriented. No focal neurological deficits. Extremities: Symmetric 5 x 5 power. Skin: No rashes, lesions or ulcers Psychiatry: Judgement and insight appear normal. Mood & affect appropriate.     Data Reviewed: I have personally reviewed following labs and imaging studies  CBC: Recent Labs  Lab 02/02/20 0535 02/02/20 0535 02/02/20 1700 02/02/20 1700 02/03/20 0421 02/03/20 0421 02/04/20 0411 02/04/20 0411 02/04/20 1730 02/04/20 2311 02/05/20 0508 02/05/20 1114 02/06/20 0528  WBC 7.1  --   --   --  9.3  --  8.1  --   --   --  7.1  --  5.8  NEUTROABS  --   --   --   --   --   --  7.0  --   --   --  6.0  --  4.9  HGB 6.8*   < > 7.4*   < > 7.8*   < > 6.4*   < > 7.4* 7.6* 7.4* 7.7* 7.4*  HCT 22.1*   < > 23.0*  --  25.2*  --  21.1*  --   --   --  23.1*  --  22.8*  MCV 93.6  --   --   --  93.7  --  95.5  --   --   --  90.6  --  89.1  PLT 96*  --   --   --  108*  --  87*  --   --   --  95*  --  108*   < > = values in this interval not displayed.   Basic Metabolic Panel: Recent Labs  Lab 02/02/20 0535 02/03/20 0421 02/04/20 0411 02/05/20 0508 02/06/20 0528  NA 140 140 139 138 138  K 3.5 3.4* 3.8 3.7 3.5  CL 107 106 106 106 106  CO2 '25 23 26 24 23  '$ GLUCOSE 150* 147* 198* 212* 199*  BUN 44* 48* 61* 70* 74*  CREATININE 2.73* 2.63* 3.30* 3.27* 3.18*  CALCIUM 9.3 9.2 8.6* 8.4* 8.7*  MG  --   --  1.9 2.0  --    GFR: Estimated Creatinine Clearance: 30.9 mL/min (A) (by C-G formula based on SCr of 3.18 mg/dL (H)). Liver Function Tests: No results for input(s): AST, ALT, ALKPHOS, BILITOT, PROT, ALBUMIN in the last 168 hours. No results for input(s): LIPASE, AMYLASE in the last 168 hours. Recent Labs  Lab 02/02/20 0910  AMMONIA 56*   Coagulation Profile: No  results for input(s): INR, PROTIME in the last 168 hours. Cardiac Enzymes: No results for input(s): CKTOTAL, CKMB, CKMBINDEX, TROPONINI in the last 168 hours. BNP (last 3 results) No results for input(s): PROBNP in the last 8760 hours. HbA1C: No results for input(s): HGBA1C in the last 72 hours. CBG: Recent Labs  Lab 02/05/20 0807 02/05/20 1157 02/05/20 1707 02/05/20 2117 02/06/20 0800  GLUCAP 173* 253* 231* 249* 181*   Lipid Profile: No results for input(s): CHOL, HDL, LDLCALC, TRIG, CHOLHDL, LDLDIRECT in the last 72 hours. Thyroid Function Tests: No results for input(s): TSH, T4TOTAL, FREET4, T3FREE, THYROIDAB in the last 72 hours. Anemia Panel:  No results for input(s): VITAMINB12, FOLATE, FERRITIN, TIBC, IRON, RETICCTPCT in the last 72 hours. Sepsis Labs: No results for input(s): PROCALCITON, LATICACIDVEN in the last 168 hours.  Recent Results (from the past 240 hour(s))  Respiratory Panel by RT PCR (Flu A&B, Covid) - Nasopharyngeal Swab     Status: None   Collection Time: 01/28/20 10:34 PM   Specimen: Nasopharyngeal Swab  Result Value Ref Range Status   SARS Coronavirus 2 by RT PCR NEGATIVE NEGATIVE Final    Comment: (NOTE) SARS-CoV-2 target nucleic acids are NOT DETECTED. The SARS-CoV-2 RNA is generally detectable in upper respiratoy specimens during the acute phase of infection. The lowest concentration of SARS-CoV-2 viral copies this assay can detect is 131 copies/mL. A negative result does not preclude SARS-Cov-2 infection and should not be used as the sole basis for treatment or other patient management decisions. A negative result may occur with  improper specimen collection/handling, submission of specimen other than nasopharyngeal swab, presence of viral mutation(s) within the areas targeted by this assay, and inadequate number of viral copies (<131 copies/mL). A negative result must be combined with clinical observations, patient history, and epidemiological  information. The expected result is Negative. Fact Sheet for Patients:  PinkCheek.be Fact Sheet for Healthcare Providers:  GravelBags.it This test is not yet ap proved or cleared by the [place] FDA and  has been authorized for detection and/or diagnosis of SARS-CoV-2 by FDA under an Emergency Use Authorization (EUA). This EUA will remain  in effect (meaning this test can be used) for the duration of the COVID-19 declaration under Section 564(b)(1) of the Act, 21 U.S.C. section 360bbb-3(b)(1), unless the authorization is terminated or revoked sooner.    Influenza A by PCR NEGATIVE NEGATIVE Final   Influenza B by PCR NEGATIVE NEGATIVE Final    Comment: (NOTE) The Xpert Xpress SARS-CoV-2/FLU/RSV assay is intended as an aid in  the diagnosis of influenza from Nasopharyngeal swab specimens and  should not be used as a sole basis for treatment. Nasal washings and  aspirates are unacceptable for Xpert Xpress SARS-CoV-2/FLU/RSV  testing. Fact Sheet for Patients: PinkCheek.be Fact Sheet for Healthcare Providers: GravelBags.it This test is not yet approved or cleared by the [place] FDA and  has been authorized for detection and/or diagnosis of SARS-CoV-2 by  FDA under an Emergency Use Authorization (EUA). This EUA will remain  in effect (meaning this test can be used) for the duration of the  Covid-19 declaration under Section 564(b)(1) of the Act, 21  U.S.C. section 360bbb-3(b)(1), unless the authorization is  terminated or revoked. Performed at [facility], [address] [place],  [address]          Radiology Studies: No results found.      Scheduled Meds: . acetaminophen  650 mg Oral Once  . atorvastatin  20 mg Oral q1800  . hydrALAZINE  25 mg Oral TID  . insulin aspart  0-9 Units Subcutaneous TID WC  . iron polysaccharides   150 mg Oral Daily  . lactulose  30 g Oral BID  . pantoprazole (PROTONIX) IV  40 mg Intravenous Q12H  . potassium chloride  40 mEq Oral Once  . sodium chloride flush  3 mL Intravenous Once  . spironolactone  25 mg Oral Daily   Continuous Infusions: . furosemide (LASIX) infusion 8 mg/hr (02/05/20 1800)     LOS: 8 days    Time spent: 26 minutes    Sharen Hones, MD [facility]   To  contact the attending provider between 7A-7P or the covering provider during after hours 7P-7A, please log into the web site [email or web address] and access using universal [place] password for that web site. If you do not have the password, please call the hospital operator.  02/06/2020, 8:19 AM

## 2020-02-07 LAB — BASIC METABOLIC PANEL
Anion gap: 7 (ref 5–15)
BUN: 74 mg/dL — ABNORMAL HIGH (ref 8–23)
CO2: 25 mmol/L (ref 22–32)
Calcium: 9 mg/dL (ref 8.9–10.3)
Chloride: 109 mmol/L (ref 98–111)
Creatinine, Ser: 2.92 mg/dL — ABNORMAL HIGH (ref 0.61–1.24)
GFR calc Af Amer: 24 mL/min — ABNORMAL LOW (ref >60)
GFR calc non Af Amer: 21 mL/min — ABNORMAL LOW (ref >60)
Glucose, Bld: 205 mg/dL — ABNORMAL HIGH (ref 70–99)
Potassium: 3.6 mmol/L (ref 3.5–5.1)
Sodium: 141 mmol/L (ref 135–145)

## 2020-02-07 LAB — CBC WITH DIFFERENTIAL/PLATELET
Abs Immature Granulocytes: 0.03 10*3/uL (ref 0.00–0.07)
Basophils Absolute: 0 10*3/uL (ref 0.0–0.1)
Basophils Relative: 0 %
Eosinophils Absolute: 0 10*3/uL (ref 0.0–0.5)
Eosinophils Relative: 0 %
HCT: 23.1 % — ABNORMAL LOW (ref 39.0–52.0)
Hemoglobin: 7.4 g/dL — ABNORMAL LOW (ref 13.0–17.0)
Immature Granulocytes: 1 %
Lymphocytes Relative: 9 %
Lymphs Abs: 0.5 10*3/uL — ABNORMAL LOW (ref 0.7–4.0)
MCH: 29.2 pg (ref 26.0–34.0)
MCHC: 32 g/dL (ref 30.0–36.0)
MCV: 91.3 fL (ref 80.0–100.0)
Monocytes Absolute: 0.5 10*3/uL (ref 0.1–1.0)
Monocytes Relative: 10 %
Neutro Abs: 4 10*3/uL (ref 1.7–7.7)
Neutrophils Relative %: 80 %
Platelets: 108 10*3/uL — ABNORMAL LOW (ref 150–400)
RBC: 2.53 MIL/uL — ABNORMAL LOW (ref 4.22–5.81)
RDW: 14.6 % (ref 11.5–15.5)
WBC: 5.1 10*3/uL (ref 4.0–10.5)
nRBC: 0 % (ref 0.0–0.2)

## 2020-02-07 LAB — GLUCOSE, CAPILLARY
Glucose-Capillary: 167 mg/dL — ABNORMAL HIGH (ref 70–99)
Glucose-Capillary: 252 mg/dL — ABNORMAL HIGH (ref 70–99)
Glucose-Capillary: 201 mg/dL — ABNORMAL HIGH (ref 70–99)
Glucose-Capillary: 189 mg/dL — ABNORMAL HIGH (ref 70–99)

## 2020-02-07 LAB — MAGNESIUM: Magnesium: 2 mg/dL (ref 1.7–2.4)

## 2020-02-07 MED ORDER — AMLODIPINE BESYLATE 5 MG PO TABS
5.0000 mg | ORAL_TABLET | Freq: Every day | ORAL | Status: DC
  Administered 2020-02-07 – 2020-02-12 (×6): 5 mg via ORAL
  Filled 2020-02-07 (×6): qty 1

## 2020-02-07 MED ORDER — PANTOPRAZOLE SODIUM 40 MG PO TBEC
40.0000 mg | DELAYED_RELEASE_TABLET | Freq: Two times a day (BID) | ORAL | Status: DC
  Administered 2020-02-07 – 2020-02-12 (×10): 40 mg via ORAL
  Filled 2020-02-07 (×10): qty 1

## 2020-02-07 NOTE — Progress Notes (Signed)
Patient declined Q AM wound care at this time. Will continue to monitor. Wenda Low [facility]

## 2020-02-07 NOTE — Plan of Care (Signed)
  Problem: Education: Goal: Knowledge of General Education information will improve Description: Including pain rating scale, medication(s)/side effects and non-pharmacologic comfort measures Outcome: Progressing   Problem: Health Behavior/Discharge Planning: Goal: Ability to manage health-related needs will improve Outcome: Not Progressing Note: Patient remains with a rectal tube. Functioning properly. Remains on PO Lactulose. Will continue to monitor GI status for the remainder of the shift. Wenda Low [facility]

## 2020-02-07 NOTE — Progress Notes (Signed)
PROGRESS NOTE    Corey West  VXB:[id number] DOB: Feb 16, 1951 DOA: 01/28/2020 PCP: System, Pcp Not In   Brief Narrative:  Corey West a 69 y.o.malewith medical history significant forhepatic cirrhosis with esophageal varices and portal hypertension gastropathy, insulin-dependent type 2 diabetes, CKD stage IV, hypertension, hyperlipidemia, chronic venous stasis dermatitis and edema bilateral lower extremities, anxiety, and obesity who presents to the ED for evaluation of progressive volume overload.  Patient was seen by nephrology, was placed on Lasix IV drip for volume overload. Patient also had some visual hallucination with mild elevation of ammonia level.  5/6.Patient hemoglobin dropped down to 6.4, received a unit of transfusion. Consult GI, determined that patient does not have active bleeding.   Assessment & Plan:   Principal Problem:   Volume overload Active Problems:   Cirrhosis (HCC)   Thrombocytopenia (HCC)   Chronic venous stasis dermatitis of both lower extremities   Hypertension associated with diabetes ([place])   Type 2 diabetes mellitus with diabetic nephropathy, with long-term current use of insulin (HCC)   Acute on chronic diastolic CHF (congestive heart failure) (HCC)   CKD (chronic kidney disease) stage 4, GFR 15-29 ml/min (HCC)   Hyperlipidemia associated with type 2 diabetes mellitus ([place])   Acute hypoxemic respiratory failure ([place])  #1. Acute on chronic anemia. Patient does not have active bleeding. Hemoglobin has been stable for the last 2 days.  2. Acute hypoxemic respiratory failure. Oxygenation improving. Continue oxygen treatment.  3. Acute on chronic diastolic congestive heart failure. Continue IV Lasix drip.  4. Liver cirrhosis with hepatic encephalopathy. Mental status has improved.  5. Chronic kidney disease stage IV. Renal function stable on Lasix drip.  6. Type 2 diabetes. Continue sliding scale insulin.  7. Atrial  fibrillation. Heart rate under control.  8. Thrombocytopenia. Stable.  9. Morbid obesity.  DVT prophylaxis:SCDS Code Status:Full code Family Communication:None Disposition Plan:  Patient came from:  Anticipated d/c place:  Barriers to d/c OR conditions which need to be met to effect a safe d/c: Not ready for discharge.  Consultants:  Nephrology  Gastroenterology  Procedures:None Antimicrobials:None      Subjective: Patient states that he was briefly confused earlier this morning when he woke. Feel better now. Oxygenation is gradually improving. No significant short of breath today. No cough. Still has some loose stools from lactulose use.  Objective: Vitals:   02/06/20 2200 02/07/20 0406 02/07/20 0801 02/07/20 1205  BP:  (!) 147/59 (!) 149/70 (!) 169/70  Pulse:  65 64 65  Resp: '20 20 19 20  '$ Temp:  98 F (36.7 C) 98.4 F (36.9 C) 98.4 F (36.9 C)  TempSrc:  Oral    SpO2:  98% 99% 97%  Weight:  (!) 140.7 kg    Height:        Intake/Output Summary (Last 24 hours) at 02/07/2020 1225 Last data filed at 02/07/2020 0945 Gross per 24 hour  Intake 480 ml  Output 502 ml  Net -22 ml   Filed Weights   02/05/20 0304 02/06/20 0453 02/07/20 0406  Weight: (!) 141.3 kg (!) 139.9 kg (!) 140.7 kg    Examination:  General exam: Appears calm and comfortable  Respiratory system: Clear to auscultation. Respiratory effort normal. Cardiovascular system: Irregular. No JVD, murmurs, rubs, gallops or clicks. 2+ pedal edema. Gastrointestinal system: Abdomen is nondistended, soft and nontender. No organomegaly or masses felt. Normal bowel sounds heard. Central nervous system: Alert and oriented. No focal neurological deficits. Extremities: Symmetric  Skin: No rashes,  lesions or ulcers Psychiatry: Judgement and insight appear normal. Mood & affect  appropriate.     Data Reviewed: I have personally reviewed following labs and imaging studies  CBC: Recent Labs  Lab 02/03/20 0421 02/03/20 0421 02/04/20 0411 02/04/20 1730 02/04/20 2311 02/05/20 0508 02/05/20 1114 02/06/20 0528 02/07/20 0556  WBC 9.3  --  8.1  --   --  7.1  --  5.8 5.1  NEUTROABS  --   --  7.0  --   --  6.0  --  4.9 4.0  HGB 7.8*   < > 6.4*   < > 7.6* 7.4* 7.7* 7.4* 7.4*  HCT 25.2*  --  21.1*  --   --  23.1*  --  22.8* 23.1*  MCV 93.7  --  95.5  --   --  90.6  --  89.1 91.3  PLT 108*  --  87*  --   --  95*  --  108* 108*   < > = values in this interval not displayed.   Basic Metabolic Panel: Recent Labs  Lab 02/03/20 0421 02/04/20 0411 02/05/20 0508 02/06/20 0528 02/07/20 0556  NA 140 139 138 138 141  K 3.4* 3.8 3.7 3.5 3.6  CL 106 106 106 106 109  CO2 '23 26 24 23 25  '$ GLUCOSE 147* 198* 212* 199* 205*  BUN 48* 61* 70* 74* 74*  CREATININE 2.63* 3.30* 3.27* 3.18* 2.92*  CALCIUM 9.2 8.6* 8.4* 8.7* 9.0  MG  --  1.9 2.0  --  2.0   GFR: Estimated Creatinine Clearance: 33.8 mL/min (A) (by C-G formula based on SCr of 2.92 mg/dL (H)). Liver Function Tests: No results for input(s): AST, ALT, ALKPHOS, BILITOT, PROT, ALBUMIN in the last 168 hours. No results for input(s): LIPASE, AMYLASE in the last 168 hours. Recent Labs  Lab 02/02/20 0910  AMMONIA 56*   Coagulation Profile: No results for input(s): INR, PROTIME in the last 168 hours. Cardiac Enzymes: No results for input(s): CKTOTAL, CKMB, CKMBINDEX, TROPONINI in the last 168 hours. BNP (last 3 results) No results for input(s): PROBNP in the last 8760 hours. HbA1C: No results for input(s): HGBA1C in the last 72 hours. CBG: Recent Labs  Lab 02/06/20 1142 02/06/20 1548 02/06/20 2101 02/07/20 0802 02/07/20 1204  GLUCAP 173* 163* 154* 167* 252*   Lipid Profile: No results for input(s): CHOL, HDL, LDLCALC, TRIG, CHOLHDL, LDLDIRECT in the last 72 hours. Thyroid Function Tests: No results for  input(s): TSH, T4TOTAL, FREET4, T3FREE, THYROIDAB in the last 72 hours. Anemia Panel: No results for input(s): VITAMINB12, FOLATE, FERRITIN, TIBC, IRON, RETICCTPCT in the last 72 hours. Sepsis Labs: No results for input(s): PROCALCITON, LATICACIDVEN in the last 168 hours.  Recent Results (from the past 240 hour(s))  Respiratory Panel by RT PCR (Flu A&B, Covid) - Nasopharyngeal Swab     Status: None   Collection Time: 01/28/20 10:34 PM   Specimen: Nasopharyngeal Swab  Result Value Ref Range Status   SARS Coronavirus 2 by RT PCR NEGATIVE NEGATIVE Final    Comment: (NOTE) SARS-CoV-2 target nucleic acids are NOT DETECTED. The SARS-CoV-2 RNA is generally detectable in upper respiratoy specimens during the acute phase of infection. The lowest concentration of SARS-CoV-2 viral copies this assay can detect is 131 copies/mL. A negative result does not preclude SARS-Cov-2 infection and should not be used as the sole basis for treatment or other patient management decisions. A negative result may occur with  improper specimen collection/handling, submission of specimen other  than nasopharyngeal swab, presence of viral mutation(s) within the areas targeted by this assay, and inadequate number of viral copies (<131 copies/mL). A negative result must be combined with clinical observations, patient history, and epidemiological information. The expected result is Negative. Fact Sheet for Patients:  PinkCheek.be Fact Sheet for Healthcare Providers:  GravelBags.it This test is not yet ap proved or cleared by the [place] FDA and  has been authorized for detection and/or diagnosis of SARS-CoV-2 by FDA under an Emergency Use Authorization (EUA). This EUA will remain  in effect (meaning this test can be used) for the duration of the COVID-19 declaration under Section 564(b)(1) of the Act, 21 U.S.C. section 360bbb-3(b)(1), unless the  authorization is terminated or revoked sooner.    Influenza A by PCR NEGATIVE NEGATIVE Final   Influenza B by PCR NEGATIVE NEGATIVE Final    Comment: (NOTE) The Xpert Xpress SARS-CoV-2/FLU/RSV assay is intended as an aid in  the diagnosis of influenza from Nasopharyngeal swab specimens and  should not be used as a sole basis for treatment. Nasal washings and  aspirates are unacceptable for Xpert Xpress SARS-CoV-2/FLU/RSV  testing. Fact Sheet for Patients: PinkCheek.be Fact Sheet for Healthcare Providers: GravelBags.it This test is not yet approved or cleared by the [place] FDA and  has been authorized for detection and/or diagnosis of SARS-CoV-2 by  FDA under an Emergency Use Authorization (EUA). This EUA will remain  in effect (meaning this test can be used) for the duration of the  Covid-19 declaration under Section 564(b)(1) of the Act, 21  U.S.C. section 360bbb-3(b)(1), unless the authorization is  terminated or revoked. Performed at [facility], [address] [place], [place] [address]          Radiology Studies: No results found.      Scheduled Meds: . acetaminophen  650 mg Oral Once  . amLODipine  5 mg Oral Daily  . atorvastatin  20 mg Oral q1800  . hydrALAZINE  25 mg Oral TID  . insulin aspart  0-9 Units Subcutaneous TID WC  . iron polysaccharides  150 mg Oral Daily  . lactulose  30 g Oral BID  . pantoprazole  40 mg Oral BID  . sodium chloride flush  3 mL Intravenous Once  . spironolactone  25 mg Oral Daily   Continuous Infusions: . furosemide (LASIX) infusion 8 mg/hr (02/06/20 1725)     LOS: 9 days    Time spent: 25 mins    Sharen Hones, MD [facility]   To contact the attending provider between 7A-7P or the covering provider during after hours 7P-7A, please log into the web site [email or web address] and access using universal [place] password for that web site. If  you do not have the password, please call the hospital operator.  02/07/2020, 12:25 PM

## 2020-02-07 NOTE — Progress Notes (Signed)
[facility], [place] 02/07/20  Subjective:   Hospital day # 9  Urine output documented as 500 cc over the preceding 24 hours however unclear as to whether this was accurate. Creatinine down to 2.92. Still has considerable lower extremity edema however.  Objective:  Vital signs in last 24 hours:  Temp:  [98 F (36.7 C)-98.4 F (36.9 C)] 98.4 F (36.9 C) (05/09 1205) Pulse Rate:  [64-70] 65 (05/09 1205) Resp:  [19-24] 20 (05/09 1205) BP: (147-169)/(54-70) 169/70 (05/09 1205) SpO2:  [97 %-99 %] 97 % (05/09 1205) Weight:  [140.7 kg] 140.7 kg (05/09 0406)  Weight change: 0.816 kg Filed Weights   02/05/20 0304 02/06/20 0453 02/07/20 0406  Weight: (!) 141.3 kg (!) 139.9 kg (!) 140.7 kg    Intake/Output:    Intake/Output Summary (Last 24 hours) at 02/07/2020 1610 Last data filed at 02/07/2020 1103 Gross per 24 hour  Intake 240 ml  Output 802 ml  Net -562 ml     Physical Exam: General: NAD, laying in bed  HEENT Anicteric   Pulm/lungs basilar rales, normal effort  CVS/Heart irregular  Abdomen:  Soft, NTND  Extremities: 2+ lower extremity edema  Neurologic: Alert, oriented, follows commands  Skin: Stasis dermatitis bilateral lower extremities  GU: External urinary catheter       Basic Metabolic Panel:  Recent Labs  Lab 02/03/20 0421 02/03/20 0421 02/04/20 0411 02/04/20 0411 02/05/20 0508 02/06/20 0528 02/07/20 0556  NA 140  --  139  --  138 138 141  K 3.4*  --  3.8  --  3.7 3.5 3.6  CL 106  --  106  --  106 106 109  CO2 23  --  26  --  24 23 25   GLUCOSE 147*  --  198*  --  212* 199* 205*  BUN 48*  --  61*  --  70* 74* 74*  CREATININE 2.63*  --  3.30*  --  3.27* 3.18* 2.92*  CALCIUM 9.2   < > 8.6*   < > 8.4* 8.7* 9.0  MG  --   --  1.9  --  2.0  --  2.0   < > = values in this interval not displayed.     CBC: Recent Labs  Lab 02/03/20 0421 02/03/20 0421 02/04/20 0411 02/04/20 1730 02/04/20 2311 02/05/20 0508  02/05/20 1114 02/06/20 0528 02/07/20 0556  WBC 9.3  --  8.1  --   --  7.1  --  5.8 5.1  NEUTROABS  --   --  7.0  --   --  6.0  --  4.9 4.0  HGB 7.8*   < > 6.4*   < > 7.6* 7.4* 7.7* 7.4* 7.4*  HCT 25.2*  --  21.1*  --   --  23.1*  --  22.8* 23.1*  MCV 93.7  --  95.5  --   --  90.6  --  89.1 91.3  PLT 108*  --  87*  --   --  95*  --  108* 108*   < > = values in this interval not displayed.      Lab Results  Component Value Date   HEPBSAG NON REACTIVE 01/30/2020   HEPBSAB NON REACTIVE 01/30/2020   HEPBIGM NON REACTIVE 01/30/2020      Microbiology:  Recent Results (from the past 240 hour(s))  Respiratory Panel by RT PCR (Flu A&B, Covid) - Nasopharyngeal Swab     Status: None  Collection Time: 01/28/20 10:34 PM   Specimen: Nasopharyngeal Swab  Result Value Ref Range Status   SARS Coronavirus 2 by RT PCR NEGATIVE NEGATIVE Final    Comment: (NOTE) SARS-CoV-2 target nucleic acids are NOT DETECTED. The SARS-CoV-2 RNA is generally detectable in upper respiratoy specimens during the acute phase of infection. The lowest concentration of SARS-CoV-2 viral copies this assay can detect is 131 copies/mL. A negative result does not preclude SARS-Cov-2 infection and should not be used as the sole basis for treatment or other patient management decisions. A negative result may occur with  improper specimen collection/handling, submission of specimen other than nasopharyngeal swab, presence of viral mutation(s) within the areas targeted by this assay, and inadequate number of viral copies (<131 copies/mL). A negative result must be combined with clinical observations, patient history, and epidemiological information. The expected result is Negative. Fact Sheet for Patients:  PinkCheek.be Fact Sheet for Healthcare Providers:  GravelBags.it This test is not yet ap proved or cleared by the [place] FDA and  has been authorized  for detection and/or diagnosis of SARS-CoV-2 by FDA under an Emergency Use Authorization (EUA). This EUA will remain  in effect (meaning this test can be used) for the duration of the COVID-19 declaration under Section 564(b)(1) of the Act, 21 U.S.C. section 360bbb-3(b)(1), unless the authorization is terminated or revoked sooner.    Influenza A by PCR NEGATIVE NEGATIVE Final   Influenza B by PCR NEGATIVE NEGATIVE Final    Comment: (NOTE) The Xpert Xpress SARS-CoV-2/FLU/RSV assay is intended as an aid in  the diagnosis of influenza from Nasopharyngeal swab specimens and  should not be used as a sole basis for treatment. Nasal washings and  aspirates are unacceptable for Xpert Xpress SARS-CoV-2/FLU/RSV  testing. Fact Sheet for Patients: PinkCheek.be Fact Sheet for Healthcare Providers: GravelBags.it This test is not yet approved or cleared by the [place] FDA and  has been authorized for detection and/or diagnosis of SARS-CoV-2 by  FDA under an Emergency Use Authorization (EUA). This EUA will remain  in effect (meaning this test can be used) for the duration of the  Covid-19 declaration under Section 564(b)(1) of the Act, 21  U.S.C. section 360bbb-3(b)(1), unless the authorization is  terminated or revoked. Performed at [facility], [address] [place], [place] [address]     Coagulation Studies: No results for input(s): LABPROT, INR in the last 72 hours.  Urinalysis: No results for input(s): COLORURINE, LABSPEC, PHURINE, GLUCOSEU, HGBUR, BILIRUBINUR, KETONESUR, PROTEINUR, UROBILINOGEN, NITRITE, LEUKOCYTESUR in the last 72 hours.  Invalid input(s): APPERANCEUR    Imaging: No results found.   Medications:   . furosemide (LASIX) infusion 8 mg/hr (02/06/20 1725)   . acetaminophen  650 mg Oral Once  . amLODipine  5 mg Oral Daily  . atorvastatin  20 mg Oral q1800  . hydrALAZINE  25 mg Oral TID  .  insulin aspart  0-9 Units Subcutaneous TID WC  . iron polysaccharides  150 mg Oral Daily  . lactulose  30 g Oral BID  . pantoprazole  40 mg Oral BID  . sodium chloride flush  3 mL Intravenous Once  . 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  Not currently on ACE-I/ARB at home.  Chronic kidney disease secondary to diabetes, hypertension.  Acute renal failure secondary to acute cardiorenal syndrome 05/08 0701 - 05/09 0700 In: 240 [P.O.:240] Out: 502 [Urine:501; Stool:1]  Lab Results  Component Value Date   CREATININE 2.92 (H) 02/07/2020   CREATININE 3.18 (H) 02/06/2020   CREATININE 3.27 (H) 02/05/2020    #Cirrhosis with ascites and volume overload   #Anemia of chronic kidney disease and history of GI bleed. Thrombocytopenia.  Lab Results  Component Value Date   HGB 7.4 (L) 02/07/2020    # Diabetes mellitus type II with chronic kidney disease: insulin dependent. Lab Results  Component Value Date   HGBA1C 6.6 (H) 12/15/2019    Plan -Urine output yesterday was documented as 500 cc.  Unclear as to whether this is accurate as he has an external urinary catheter.  However renal function did improve his creatinine came down to 2.92.  Therefore we will maintain the patient on furosemide drip at the moment.  Hemoglobin currently 7.4.  No urgent indication for transfusion however this could  be considered if hemoglobin drops to 7 or less.  Otherwise continue supportive care.     LOS: 9 Rufus Beske 5/9/20214:10 PM  [place], [place]  Note: This note was prepared with Dragon dictation. Any transcription errors are unintentional

## 2020-02-07 NOTE — Progress Notes (Signed)
Pt cleansed and bed linens and gown changed, pt bathed due to inct of urine and feces.  External cath was not on pt and although pt has fecal tube, it came out around it.

## 2020-02-08 ENCOUNTER — Ambulatory Visit: Payer: Medicare Other | Admitting: Physician Assistant

## 2020-02-08 LAB — GLUCOSE, CAPILLARY
Glucose-Capillary: 192 mg/dL — ABNORMAL HIGH (ref 70–99)
Glucose-Capillary: 184 mg/dL — ABNORMAL HIGH (ref 70–99)
Glucose-Capillary: 239 mg/dL — ABNORMAL HIGH (ref 70–99)
Glucose-Capillary: 220 mg/dL — ABNORMAL HIGH (ref 70–99)

## 2020-02-08 MED ORDER — ENSURE ENLIVE PO LIQD
237.0000 mL | Freq: Two times a day (BID) | ORAL | Status: DC
  Administered 2020-02-08: 14:00:00 237 mL via ORAL

## 2020-02-08 MED ORDER — ADULT MULTIVITAMIN W/MINERALS CH
1.0000 | ORAL_TABLET | Freq: Every day | ORAL | Status: DC
  Administered 2020-02-09 – 2020-02-12 (×4): 1 via ORAL
  Filled 2020-02-08 (×4): qty 1

## 2020-02-08 NOTE — Care Management Important Message (Signed)
Important Message  Patient Details  Name: Corey West MRN: [id number] Date of Birth: 1951/08/28   Medicare Important Message Given:  Yes     Dannette Barbara 02/08/2020, 12:25 PM

## 2020-02-08 NOTE — Progress Notes (Signed)
[place], [place] 02/08/20  Subjective:   Hospital day # 10  Patient seen and evaluated at bedside. Renal function about the same. Urine output recording not accurate as patient incontinent.  Objective:  Vital signs in last 24 hours:  Temp:  [98.2 F (36.8 C)-98.5 F (36.9 C)] 98.3 F (36.8 C) (05/10 1202) Pulse Rate:  [66-86] 86 (05/10 1202) Resp:  [18-19] 19 (05/10 1202) BP: (135-166)/(55-89) 135/63 (05/10 1202) SpO2:  [98 %-100 %] 100 % (05/10 1202) Weight:  [761 kg] 138 kg (05/10 0417)  Weight change: -2.722 kg Filed Weights   02/06/20 0453 02/07/20 0406 02/08/20 0417  Weight: (!) 139.9 kg (!) 140.7 kg (!) 138 kg    Intake/Output:    Intake/Output Summary (Last 24 hours) at 02/08/2020 1416 Last data filed at 02/08/2020 1007 Gross per 24 hour  Intake 240 ml  Output 150 ml  Net 90 ml     Physical Exam: General: NAD, laying in bed  HEENT Anicteric   Pulm/lungs basilar rales, normal effort  CVS/Heart irregular  Abdomen:  Soft, NTND  Extremities: 2+ lower extremity edema  Neurologic: Alert, oriented, follows commands  Skin: Stasis dermatitis bilateral lower extremities          Basic Metabolic Panel:  Recent Labs  Lab 02/03/20 0421 02/03/20 0421 02/04/20 0411 02/04/20 0411 02/05/20 0508 02/06/20 0528 02/07/20 0556  NA 140  --  139  --  138 138 141  K 3.4*  --  3.8  --  3.7 3.5 3.6  CL 106  --  106  --  106 106 109  CO2 23  --  26  --  24 23 25   GLUCOSE 147*  --  198*  --  212* 199* 205*  BUN 48*  --  61*  --  70* 74* 74*  CREATININE 2.63*  --  3.30*  --  3.27* 3.18* 2.92*  CALCIUM 9.2   < > 8.6*   < > 8.4* 8.7* 9.0  MG  --   --  1.9  --  2.0  --  2.0   < > = values in this interval not displayed.     CBC: Recent Labs  Lab 02/03/20 0421 02/03/20 0421 02/04/20 0411 02/04/20 1730 02/04/20 2311 02/05/20 0508 02/05/20 1114 02/06/20 0528 02/07/20 0556  WBC 9.3  --  8.1  --   --  7.1  --  5.8 5.1   NEUTROABS  --   --  7.0  --   --  6.0  --  4.9 4.0  HGB 7.8*   < > 6.4*   < > 7.6* 7.4* 7.7* 7.4* 7.4*  HCT 25.2*  --  21.1*  --   --  23.1*  --  22.8* 23.1*  MCV 93.7  --  95.5  --   --  90.6  --  89.1 91.3  PLT 108*  --  87*  --   --  95*  --  108* 108*   < > = values in this interval not displayed.      Lab Results  Component Value Date   HEPBSAG NON REACTIVE 01/30/2020   HEPBSAB NON REACTIVE 01/30/2020   HEPBIGM NON REACTIVE 01/30/2020      Microbiology:  No results found for this or any previous visit (from the past 240 hour(s)).  Coagulation Studies: No results for input(s): LABPROT, INR in the last 72 hours.  Urinalysis: No results for input(s): COLORURINE, LABSPEC, [place], [place], [place], [place],  KETONESUR, PROTEINUR, UROBILINOGEN, NITRITE, LEUKOCYTESUR in the last 72 hours.  Invalid input(s): APPERANCEUR    Imaging: No results found.   Medications:   . furosemide (LASIX) infusion 8 mg/hr (02/08/20 0000)   . amLODipine  5 mg Oral Daily  . atorvastatin  20 mg Oral q1800  . feeding supplement (ENSURE ENLIVE)  237 mL Oral BID BM  . hydrALAZINE  25 mg Oral TID  . insulin aspart  0-9 Units Subcutaneous TID WC  . iron polysaccharides  150 mg Oral Daily  . lactulose  30 g Oral BID  . [START ON 02/09/2020] multivitamin with minerals  1 tablet Oral Daily  . pantoprazole  40 mg Oral BID  . sodium chloride flush  3 mL Intravenous Once  . 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  Not currently on ACE-I/ARB at home.  Chronic kidney disease secondary to diabetes, hypertension.  Acute renal failure secondary to acute cardiorenal syndrome 05/09 0701 - 05/10 0700 In: 240 [P.O.:240] Out: 450 [Stool:450]  Lab Results  Component Value Date   CREATININE 2.92 (H) 02/07/2020   CREATININE 3.18 (H) 02/06/2020   CREATININE 3.27 (H) 02/05/2020    #Cirrhosis with ascites and volume overload   #Anemia of chronic kidney disease and history of GI bleed. Thrombocytopenia.  Lab Results  Component Value Date   HGB 7.4 (L) 02/07/2020    # Diabetes mellitus type II with chronic kidney disease: insulin dependent. Lab Results  Component Value Date   HGBA1C 6.6 (H) 12/15/2019    Plan -Urine output not accurate.  Patient noted to be incontinent.  No new renal function testing today.  Recommend repeat renal function testing today.  Continue Lasix drip for now as edema is less tight.  Hemoglobin currently 7.4.  Continue to monitor hemoglobin closely.  Consider blood transfusion for hemoglobin of 7 or less.  Otherwise disposition as per hospitalist.     LOS: 10 Corey West [place] 5/10/20212:16 PM  [place], [place]  Note: This note was prepared with Dragon dictation. Any transcription errors are unintentional

## 2020-02-08 NOTE — Progress Notes (Signed)
PROGRESS NOTE    Corey West  ZOX:[id number] DOB: 1951-03-29 DOA: 01/28/2020 PCP: System, Pcp Not In   Brief Narrative:  Corey West a 69 y.o.malewith medical history significant forhepatic cirrhosis with esophageal varices and portal hypertension gastropathy, insulin-dependent type 2 diabetes, CKD stage IV, hypertension, hyperlipidemia, chronic venous stasis dermatitis and edema bilateral lower extremities, anxiety, and obesity who presents to the ED for evaluation of progressive volume overload.  Patient was seen by nephrology, was placed on Lasix IV drip for volume overload. Patient also had some visual hallucination with mild elevation of ammonia level. Patient also had a drop in hemoglobin, required 1 unit PRBC transfusion.  He was seen by GI, no active bleeding.  Assessment & Plan:   Principal Problem:   Volume overload Active Problems:   Cirrhosis (HCC)   Thrombocytopenia (HCC)   Chronic venous stasis dermatitis of both lower extremities   Hypertension associated with diabetes ([place])   Type 2 diabetes mellitus with diabetic nephropathy, with long-term current use of insulin (HCC)   Acute on chronic diastolic CHF (congestive heart failure) (HCC)   CKD (chronic kidney disease) stage 4, GFR 15-29 ml/min (HCC)   Hyperlipidemia associated with type 2 diabetes mellitus ([place])   Acute hypoxemic respiratory failure ([place])  #1.  Acute on chronic anemia. History of anemia of chronic kidney disease.  Hemoglobin has been stable, no active bleeding.  2.  Acute hypoxemic respiratory failure. Secondary to acute on chronic diastolic congestive heart failure.  Condition gradually improving with diuretics.  Continue to follow.  3.  Acute on chronic diastolic congestive heart failure. Volume status gradually improving.  Continue IV Lasix drip under the guidance of nephrology.  4.  Liver cirrhosis with hepatic encephalopathy. Condition stable.  5.  Chronic kidney disease stage IV.  Renal function still stable.  6.  Type 2 diabetes.  Continue sliding scale insulin.  7.  Atrial fibrillation. Not a candidate for anticoagulation due to concern for bleeding.  8.  Thrombocytopenia. Stable.  9.  Morbid obesity.  DVT prophylaxis:SCDS Code Status:Full code Family Communication:None Disposition Plan:  Patient came from:  Anticipated d/c place:  Barriers to d/c OR conditions which need to be met to effect a safe d/c: Not ready for discharge.  Consultants:  Nephrology  Gastroenterology  Procedures:None Antimicrobials:None      Subjective: Patient doing well today.  Still has significant weakness, working with physical therapy. Short of breath is improving.  But is still on 2-1/2 L oxygen. Still has some loose stools on lactulose.  Objective: Vitals:   02/07/20 1928 02/08/20 0417 02/08/20 0737 02/08/20 1202  BP: (!) 162/56 (!) 157/55 140/89 135/63  Pulse: 79 74 66 86  Resp:   18 19  Temp: 98.4 F (36.9 C) 98.4 F (36.9 C) 98.2 F (36.8 C) 98.3 F (36.8 C)  TempSrc: Oral Oral    SpO2: 98% 100% 100% 100%  Weight:  (!) 138 kg    Height:        Intake/Output Summary (Last 24 hours) at 02/08/2020 1313 Last data filed at 02/08/2020 1007 Gross per 24 hour  Intake 240 ml  Output 150 ml  Net 90 ml   Filed Weights   02/06/20 0453 02/07/20 0406 02/08/20 0417  Weight: (!) 139.9 kg (!) 140.7 kg (!) 138 kg    Examination:  General exam: Appears calm and comfortable  Respiratory system: Coarse breathing sounds without significant crackles. Respiratory effort normal. Cardiovascular system: Irregularly irregular. No JVD, murmurs, rubs, gallops or  clicks. 2+ pedal edema. Gastrointestinal system: Abdomen is nondistended, soft and nontender. No organomegaly or masses felt. Normal bowel sounds heard. Central nervous system:  Alert and oriented. No focal neurological deficits. Extremities: Symmetric  Skin: No rashes, lesions or ulcers Psychiatry: Judgement and insight appear normal. Mood & affect appropriate.     Data Reviewed: I have personally reviewed following labs and imaging studies  CBC: Recent Labs  Lab 02/03/20 0421 02/03/20 0421 02/04/20 0411 02/04/20 1730 02/04/20 2311 02/05/20 0508 02/05/20 1114 02/06/20 0528 02/07/20 0556  WBC 9.3  --  8.1  --   --  7.1  --  5.8 5.1  NEUTROABS  --   --  7.0  --   --  6.0  --  4.9 4.0  HGB 7.8*   < > 6.4*   < > 7.6* 7.4* 7.7* 7.4* 7.4*  HCT 25.2*  --  21.1*  --   --  23.1*  --  22.8* 23.1*  MCV 93.7  --  95.5  --   --  90.6  --  89.1 91.3  PLT 108*  --  87*  --   --  95*  --  108* 108*   < > = values in this interval not displayed.   Basic Metabolic Panel: Recent Labs  Lab 02/03/20 0421 02/04/20 0411 02/05/20 0508 02/06/20 0528 02/07/20 0556  NA 140 139 138 138 141  K 3.4* 3.8 3.7 3.5 3.6  CL 106 106 106 106 109  CO2 '23 26 24 23 25  '$ GLUCOSE 147* 198* 212* 199* 205*  BUN 48* 61* 70* 74* 74*  CREATININE 2.63* 3.30* 3.27* 3.18* 2.92*  CALCIUM 9.2 8.6* 8.4* 8.7* 9.0  MG  --  1.9 2.0  --  2.0   GFR: Estimated Creatinine Clearance: 33.4 mL/min (A) (by C-G formula based on SCr of 2.92 mg/dL (H)). Liver Function Tests: No results for input(s): AST, ALT, ALKPHOS, BILITOT, PROT, ALBUMIN in the last 168 hours. No results for input(s): LIPASE, AMYLASE in the last 168 hours. Recent Labs  Lab 02/02/20 0910  AMMONIA 56*   Coagulation Profile: No results for input(s): INR, PROTIME in the last 168 hours. Cardiac Enzymes: No results for input(s): CKTOTAL, CKMB, CKMBINDEX, TROPONINI in the last 168 hours. BNP (last 3 results) No results for input(s): PROBNP in the last 8760 hours. HbA1C: No results for input(s): HGBA1C in the last 72 hours. CBG: Recent Labs  Lab 02/07/20 1204 02/07/20 1631 02/07/20 2117 02/08/20 0737 02/08/20 1203  GLUCAP  252* 201* 189* 192* 184*   Lipid Profile: No results for input(s): CHOL, HDL, LDLCALC, TRIG, CHOLHDL, LDLDIRECT in the last 72 hours. Thyroid Function Tests: No results for input(s): TSH, T4TOTAL, FREET4, T3FREE, THYROIDAB in the last 72 hours. Anemia Panel: No results for input(s): VITAMINB12, FOLATE, FERRITIN, TIBC, IRON, RETICCTPCT in the last 72 hours. Sepsis Labs: No results for input(s): PROCALCITON, LATICACIDVEN in the last 168 hours.  No results found for this or any previous visit (from the past 240 hour(s)).       Radiology Studies: No results found.      Scheduled Meds: . amLODipine  5 mg Oral Daily  . atorvastatin  20 mg Oral q1800  . feeding supplement (ENSURE ENLIVE)  237 mL Oral BID BM  . hydrALAZINE  25 mg Oral TID  . insulin aspart  0-9 Units Subcutaneous TID WC  . iron polysaccharides  150 mg Oral Daily  . lactulose  30 g Oral BID  . [START  ON 02/09/2020] multivitamin with minerals  1 tablet Oral Daily  . pantoprazole  40 mg Oral BID  . sodium chloride flush  3 mL Intravenous Once  . spironolactone  25 mg Oral Daily   Continuous Infusions: . furosemide (LASIX) infusion 8 mg/hr (02/08/20 0000)     LOS: 10 days    Time spent: 26 minutes    Sharen Hones, MD [facility]   To contact the attending provider between 7A-7P or the covering provider during after hours 7P-7A, please log into the web site [email or web address] and access using universal [place] password for that web site. If you do not have the password, please call the hospital operator.  02/08/2020, 1:13 PM

## 2020-02-08 NOTE — Progress Notes (Signed)
Initial Nutrition Assessment  DOCUMENTATION CODES:   Morbid obesity  INTERVENTION:   Ensure Enlive po BID, each supplement provides 350 kcal and 20 grams of protein  MVI daily   NUTRITION DIAGNOSIS:   Increased nutrient needs related to chronic illness(cirrhosis, CKD IV) as evidenced by increased estimated needs.  GOAL:   Patient will meet greater than or equal to 90% of their needs  MONITOR:   PO intake, Supplement acceptance, Weight trends, Skin, I & O's, Labs  REASON FOR ASSESSMENT:   LOS    ASSESSMENT:   69 y.o. 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   Met with pt in room today. Pt reports poor appetite and oral intake pta and in hospital. Pt reports that he has just not had much desire to eat. Pt documented to be eating anywhere from sips/bites to 100% of meals in hospital. Pt ate 100% of his breakfast this morning. RD will add supplements and vitamins to help pt meet his estimated needs; pt prefers chocolate flavors. Per chart, pt with weight gain pta but is down 16lbs since admit. Pt continues to have anasarca but appears to be back at his UBW; pt may have had some weight loss pta.   Pt at high risk for developing malnutrition. Pt has previously been diagnosed with moderate malnutrition in 2020 but unable to complete full exam today r/t anasarca.    Medications reviewed and include: insulin, iron polysaccharides, lactulose, aldactone, lasix  Labs reviewed: BUN 74(H), creat 2.92(H), Mg 2.0 wnl Hgb 7.4(L), Hct 23.1(L) cbgs- 192, 184 x 24 hrs AIC 6.6(H)- 3/16  NUTRITION - FOCUSED PHYSICAL EXAM:    Most Recent Value  Orbital Region  No depletion   Upper Arm Region  Moderate depletion  Thoracic and Lumbar Region  No depletion  Buccal Region  No depletion  Temple Region  No depletion  Clavicle Bone Region  No depletion  Clavicle and Acromion Bone Region  No depletion  Scapular Bone Region  No depletion  Dorsal Hand  Moderate depletion  Patellar Region  Unable to assess  Anterior Thigh Region  Unable to assess  Posterior Calf Region  Unable to assess  Edema (RD Assessment)  Severe  Hair  Reviewed  Eyes  Reviewed  Mouth  Reviewed  Skin  Reviewed  Nails  Reviewed     Diet Order:   Diet Order            Diet Carb Modified Fluid consistency: Thin; Room service appropriate? Yes; Fluid restriction: 1500 mL Fluid  Diet effective now             EDUCATION NEEDS:   Education needs have been addressed  Skin:  Skin Assessment: Reviewed RN Assessment(MASD, cellulitis)  Last BM:  5/9- TYPE 7  Height:   Ht Readings from Last 1 Encounters:  01/28/20 '5\' 9"'$  (1.753 m)    Weight:   Wt Readings from Last 1 Encounters:  02/08/20 (!) 138 kg    Ideal Body Weight:  72.7 kg  BMI:  Body mass index is 44.92 kg/m.  Estimated Nutritional Needs:   Kcal:  2700-3000kcal/day  Protein:  >135g/day  Fluid:  1.8L/day  Koleen Distance MS, RD, LDN Please refer to [facility] for RD and/or RD on-call/weekend/after hours pager

## 2020-02-09 LAB — BASIC METABOLIC PANEL
Anion gap: 8 (ref 5–15)
BUN: 67 mg/dL — ABNORMAL HIGH (ref 8–23)
CO2: 27 mmol/L (ref 22–32)
Calcium: 9 mg/dL (ref 8.9–10.3)
Chloride: 107 mmol/L (ref 98–111)
Creatinine, Ser: 2.66 mg/dL — ABNORMAL HIGH (ref 0.61–1.24)
GFR calc Af Amer: 27 mL/min — ABNORMAL LOW (ref >60)
GFR calc non Af Amer: 24 mL/min — ABNORMAL LOW (ref >60)
Glucose, Bld: 197 mg/dL — ABNORMAL HIGH (ref 70–99)
Potassium: 3.2 mmol/L — ABNORMAL LOW (ref 3.5–5.1)
Sodium: 142 mmol/L (ref 135–145)

## 2020-02-09 LAB — GLUCOSE, CAPILLARY
Glucose-Capillary: 165 mg/dL — ABNORMAL HIGH (ref 70–99)
Glucose-Capillary: 211 mg/dL — ABNORMAL HIGH (ref 70–99)
Glucose-Capillary: 212 mg/dL — ABNORMAL HIGH (ref 70–99)
Glucose-Capillary: 265 mg/dL — ABNORMAL HIGH (ref 70–99)

## 2020-02-09 LAB — CBC WITH DIFFERENTIAL/PLATELET
Abs Immature Granulocytes: 0.04 10*3/uL (ref 0.00–0.07)
Basophils Absolute: 0 10*3/uL (ref 0.0–0.1)
Basophils Relative: 0 %
Eosinophils Absolute: 0.2 10*3/uL (ref 0.0–0.5)
Eosinophils Relative: 3 %
HCT: 25 % — ABNORMAL LOW (ref 39.0–52.0)
Hemoglobin: 7.8 g/dL — ABNORMAL LOW (ref 13.0–17.0)
Immature Granulocytes: 1 %
Lymphocytes Relative: 9 %
Lymphs Abs: 0.6 10*3/uL — ABNORMAL LOW (ref 0.7–4.0)
MCH: 28.8 pg (ref 26.0–34.0)
MCHC: 31.2 g/dL (ref 30.0–36.0)
MCV: 92.3 fL (ref 80.0–100.0)
Monocytes Absolute: 0.7 10*3/uL (ref 0.1–1.0)
Monocytes Relative: 11 %
Neutro Abs: 4.6 10*3/uL (ref 1.7–7.7)
Neutrophils Relative %: 76 %
Platelets: 105 10*3/uL — ABNORMAL LOW (ref 150–400)
RBC: 2.71 MIL/uL — ABNORMAL LOW (ref 4.22–5.81)
RDW: 14.8 % (ref 11.5–15.5)
WBC: 6.2 10*3/uL (ref 4.0–10.5)
nRBC: 0 % (ref 0.0–0.2)

## 2020-02-09 MED ORDER — POTASSIUM CHLORIDE 10 MEQ/100ML IV SOLN
10.0000 meq | INTRAVENOUS | Status: AC
  Administered 2020-02-09 (×3): 10 meq via INTRAVENOUS
  Filled 2020-02-09 (×3): qty 100

## 2020-02-09 MED ORDER — INSULIN ASPART 100 UNIT/ML ~~LOC~~ SOLN
3.0000 [IU] | Freq: Three times a day (TID) | SUBCUTANEOUS | Status: DC
  Administered 2020-02-09 – 2020-02-10 (×3): 3 [IU] via SUBCUTANEOUS
  Filled 2020-02-09 (×3): qty 1

## 2020-02-09 NOTE — Progress Notes (Addendum)
Inpatient Diabetes Program Recommendations  AACE/ADA: New Consensus Statement on Inpatient Glycemic Control (2015)  Target Ranges:  Prepandial:   less than 140 mg/dL      Peak postprandial:   less than 180 mg/dL (1-2 hours)      Critically ill patients:  140 - 180 mg/dL   Results for Corey West, Corey West (MRN [id number]) as of 02/09/2020 09:27  Ref. Range 02/08/2020 07:37 02/08/2020 12:03 02/08/2020 17:00 02/08/2020 20:37  Glucose-Capillary Latest Ref Range: 70 - 99 mg/dL 192 (H)  2 units NOVOLOG  184 (H)  2 units NOVOLOG  239 (H)  3 units NOVOLOG  220 (H)   Results for Corey West, Corey West (MRN [id number]) as of 02/09/2020 09:27  Ref. Range 02/09/2020 07:50  Glucose-Capillary Latest Ref Range: 70 - 99 mg/dL 165 (H)  2 units NOVOLOG     Home DM Meds: Novolog 70/30 Mix- 84 units BID    Current Orders: Novolog Sensitive Correction Scale/ SSI (0-9 units) TID AC      MD- Please consider adding Novolog Meal Coverage:  Novolog 3 units TID with meals  (Please add the following Hold Parameters: Hold if pt eats <50% of meal, Hold if pt NPO)    --Will follow patient during hospitalization--  Wyn Quaker RN, MSN, CDE Diabetes Coordinator Inpatient Glycemic Control Team Team Pager: [phone] (8a-5p)

## 2020-02-09 NOTE — Plan of Care (Signed)
Patient remains in patient for management of fluid volume overload secondary to CHF. There have been no acute events over night. The patient has been alert and oriented x4. His vitals have been stable. He has been maintaining her oxygenation on 2L via nasal cannula. Appreciate RT assistance. Lasix drip infusing as ordered. Blood glucose monitored. Compliant with medication therapies. He received one dose of prn Ativan for anxiety. Medication effective per patient. Remains on telemetry. Rectal tube in place; peri care completed. Dressing applied to right lower extremity as ordered. Repositioning completed every 2 hours and at patient request to prevent skin breakdown. Safety precautions in place. Bed alarm activated. Patient free of falls and injuries. Plan of care continued.    Problem: Education: Goal: Knowledge of General Education information will improve Description: Including pain rating scale, medication(s)/side effects and non-pharmacologic comfort measures Outcome: Not Progressing   Problem: Health Behavior/Discharge Planning: Goal: Ability to manage health-related needs will improve Outcome: Not Progressing   Problem: Clinical Measurements: Goal: Ability to maintain clinical measurements within normal limits will improve Outcome: Not Progressing Goal: Will remain free from infection Outcome: Not Progressing Goal: Diagnostic test results will improve Outcome: Not Progressing Goal: Respiratory complications will improve Outcome: Not Progressing Goal: Cardiovascular complication will be avoided Outcome: Not Progressing   Problem: Activity: Goal: Risk for activity intolerance will decrease Outcome: Not Progressing   Problem: Nutrition: Goal: Adequate nutrition will be maintained Outcome: Not Progressing   Problem: Coping: Goal: Level of anxiety will decrease Outcome: Not Progressing   Problem: Elimination: Goal: Will not experience complications related to bowel  motility Outcome: Not Progressing Goal: Will not experience complications related to urinary retention Outcome: Not Progressing   Problem: Pain Managment: Goal: General experience of comfort will improve Outcome: Not Progressing   Problem: Safety: Goal: Ability to remain free from injury will improve Outcome: Not Progressing   Problem: Skin Integrity: Goal: Risk for impaired skin integrity will decrease Outcome: Not Progressing   Problem: Education: Goal: Ability to describe self-care measures that may prevent or decrease complications (Diabetes Survival Skills Education) will improve Outcome: Not Progressing Goal: Individualized Educational Video(s) Outcome: Not Progressing   Problem: Coping: Goal: Ability to adjust to condition or change in health will improve Outcome: Not Progressing   Problem: Fluid Volume: Goal: Ability to maintain a balanced intake and output will improve Outcome: Not Progressing   Problem: Health Behavior/Discharge Planning: Goal: Ability to identify and utilize available resources and services will improve Outcome: Not Progressing Goal: Ability to manage health-related needs will improve Outcome: Not Progressing   Problem: Metabolic: Goal: Ability to maintain appropriate glucose levels will improve Outcome: Not Progressing   Problem: Nutritional: Goal: Maintenance of adequate nutrition will improve Outcome: Not Progressing Goal: Progress toward achieving an optimal weight will improve Outcome: Not Progressing   Problem: Skin Integrity: Goal: Risk for impaired skin integrity will decrease Outcome: Not Progressing   Problem: Tissue Perfusion: Goal: Adequacy of tissue perfusion will improve Outcome: Not Progressing

## 2020-02-09 NOTE — Progress Notes (Signed)
[place], [place] 02/09/20  Subjective:   Hospital day # 11  Patient seen and evaluated at bedside. Creatinine down to 2.66. Lower extremity edema is very slowly improving.  Objective:  Vital signs in last 24 hours:  Temp:  [97.7 F (36.5 C)-98.7 F (37.1 C)] 97.7 F (36.5 C) (05/11 1142) Pulse Rate:  [55-68] 55 (05/11 1142) Resp:  [17-21] 18 (05/11 1142) BP: (135-152)/(51-56) 145/53 (05/11 1142) SpO2:  [92 %-100 %] 100 % (05/11 1142) Weight:  [137.8 kg] 137.8 kg (05/11 0455)  Weight change: -0.136 kg Filed Weights   02/07/20 0406 02/08/20 0417 02/09/20 0455  Weight: (!) 140.7 kg (!) 138 kg (!) 137.8 kg    Intake/Output:    Intake/Output Summary (Last 24 hours) at 02/09/2020 1342 Last data filed at 02/09/2020 1142 Gross per 24 hour  Intake 1439.93 ml  Output 0 ml  Net 1439.93 ml     Physical Exam: General: NAD, laying in bed  HEENT Anicteric   Pulm/lungs basilar rales, normal effort  CVS/Heart irregular  Abdomen:  Soft, NTND  Extremities: 2+ lower extremity edema  Neurologic: Alert, oriented, follows commands  Skin: Stasis dermatitis bilateral lower extremities          Basic Metabolic Panel:  Recent Labs  Lab 02/04/20 0411 02/04/20 0411 02/05/20 0508 02/05/20 0508 02/06/20 0528 02/07/20 0556 02/09/20 0602  NA 139  --  138  --  138 141 142  K 3.8  --  3.7  --  3.5 3.6 3.2*  CL 106  --  106  --  106 109 107  CO2 26  --  24  --  23 25 27   GLUCOSE 198*  --  212*  --  199* 205* 197*  BUN 61*  --  70*  --  74* 74* 67*  CREATININE 3.30*  --  3.27*  --  3.18* 2.92* 2.66*  CALCIUM 8.6*   < > 8.4*   < > 8.7* 9.0 9.0  MG 1.9  --  2.0  --   --  2.0  --    < > = values in this interval not displayed.     CBC: Recent Labs  Lab 02/04/20 0411 02/04/20 1730 02/05/20 0508 02/05/20 1114 02/06/20 0528 02/07/20 0556 02/09/20 0602  WBC 8.1  --  7.1  --  5.8 5.1 6.2  NEUTROABS 7.0  --  6.0  --  4.9 4.0 4.6  HGB 6.4*   <  > 7.4* 7.7* 7.4* 7.4* 7.8*  HCT 21.1*  --  23.1*  --  22.8* 23.1* 25.0*  MCV 95.5  --  90.6  --  89.1 91.3 92.3  PLT 87*  --  95*  --  108* 108* 105*   < > = values in this interval not displayed.      Lab Results  Component Value Date   HEPBSAG NON REACTIVE 01/30/2020   HEPBSAB NON REACTIVE 01/30/2020   HEPBIGM NON REACTIVE 01/30/2020      Microbiology:  No results found for this or any previous visit (from the past 240 hour(s)).  Coagulation Studies: No results for input(s): LABPROT, INR in the last 72 hours.  Urinalysis: No results for input(s): COLORURINE, LABSPEC, PHURINE, GLUCOSEU, HGBUR, BILIRUBINUR, KETONESUR, PROTEINUR, UROBILINOGEN, NITRITE, LEUKOCYTESUR in the last 72 hours.  Invalid input(s): APPERANCEUR    Imaging: No results found.   Medications:   . furosemide (LASIX) infusion 8 mg/hr (02/09/20 0314)   . amLODipine  5 mg Oral Daily  .  atorvastatin  20 mg Oral q1800  . feeding supplement (ENSURE ENLIVE)  237 mL Oral BID BM  . hydrALAZINE  25 mg Oral TID  . insulin aspart  0-9 Units Subcutaneous TID WC  . insulin aspart  3 Units Subcutaneous TID WC  . iron polysaccharides  150 mg Oral Daily  . lactulose  30 g Oral BID  . multivitamin with minerals  1 tablet Oral Daily  . pantoprazole  40 mg Oral BID  . sodium chloride flush  3 mL Intravenous Once  . 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  Not currently on ACE-I/ARB at home.  Chronic kidney disease secondary to diabetes, hypertension.  Acute renal failure secondary to acute cardiorenal syndrome 05/10 0701 - 05/11 0700 In: 1439.9 [P.O.:600; I.V.:639.9] Out: 0   Lab Results  Component Value Date   CREATININE 2.66 (H) 02/09/2020   CREATININE 2.92 (H) 02/07/2020   CREATININE 3.18 (H) 02/06/2020    #Cirrhosis with ascites and volume overload   #Anemia of chronic kidney disease and history of GI bleed. Thrombocytopenia.  Lab Results  Component Value Date   HGB 7.8 (L) 02/09/2020    # Diabetes mellitus type II with chronic kidney disease: insulin dependent. Lab Results  Component Value Date   HGBA1C 6.6 (H) 12/15/2019    Plan -Renal function does continue to improve very slowly.  BUN down to 67 with a creatinine of 2.6.  Okay to continue Lasix drip for now.  Consider transitioning the patient to torsemide upon discharge.  Serum potassium noted to be low at 3.2 and patient appropriately receiving potassium IV today.  Lower extremity edema also slowly improving over the course of hospitalization.     LOS: 11 Christoper Bushey 5/11/20211:42 PM  [place], [employer]  Note: This note was prepared with Dragon dictation. Any transcription errors are unintentional

## 2020-02-09 NOTE — Progress Notes (Signed)
Physical Therapy Treatment Patient Details Name: Corey West MRN: [id number] DOB: 02/18/1951 Today's Date: 02/09/2020    History of Present Illness Pt. is a 69 y.o. male who was admitted to [facility] with LE edema, and scrotal pain. Pt. PMHx includes: Hepatic Cirrhosis, with espohageal Varices, portal HTN, gastropathy, IDDM Type 2, and CKD Stage 4, HTN, Hyperlipidemia, Chronic Venous status dermatitis, and Anxiety.    PT Comments    Pt was supine in bed upon arriving. He agrees to PT session and is cooperative throughout. Pt continues to ask therapist if he needs rehab. Lengthy discussion about DC disposition and how he will benefit from SNF. He states he will think about it however after session," I know I need rehab." He was able to exit R side of bed with increased time and min assist. He stood at EOB 1 x prior to c/o severe fatigue and needing to return to supine. Vitals stable throughout on o2. RN aware of pt's lack of progression. Therapist highly recommends DC to SNF to address strength, endurance, balance, and safe functional mobility. Acute PT will continue to follow per current POC.       Follow Up Recommendations  SNF     Equipment Recommendations  None recommended by PT;Other (comment)    Recommendations for Other Services       Precautions / Restrictions Precautions Precautions: Fall Restrictions Weight Bearing Restrictions: No    Mobility  Bed Mobility Overal bed mobility: Needs Assistance Bed Mobility: Supine to Sit     Supine to sit: Min assist;HOB elevated Sit to supine: Min assist;Mod assist;HOB elevated      Transfers Overall transfer level: Needs assistance Equipment used: Rolling walker (2 wheeled)(Bariatric) Transfers: Sit to/from Stand Sit to Stand: From elevated surface;Min assist         General transfer comment: Pt required min assist to safely stand at EOB. vcs for technique and sequencing. pt fatigued quickly with standing and reuested to sit  back down. unable to ambulate 2/2 to fatigue.  Ambulation/Gait             General Gait Details: unable   Stairs             Wheelchair Mobility    Modified Rankin (Stroke Patients Only)       Balance                                            Cognition Arousal/Alertness: Awake/alert Behavior During Therapy: WFL for tasks assessed/performed Overall Cognitive Status: Within Functional Limits for tasks assessed                                 General Comments: poor insight of deficits/ health concerns      Exercises      General Comments        Pertinent Vitals/Pain Pain Assessment: No/denies pain    Home Living                      Prior Function            PT Goals (current goals can now be found in the care plan section) Acute Rehab PT Goals Patient Stated Goal: To return home Progress towards PT goals: Progressing toward goals    Frequency  Min 2X/week      PT Plan Current plan remains appropriate    Co-evaluation     PT goals addressed during session: Mobility/safety with mobility;Balance;Strengthening/ROM        AM-PAC PT "6 Clicks" Mobility   Outcome Measure  Help needed turning from your back to your side while in a flat bed without using bedrails?: A Little Help needed moving from lying on your back to sitting on the side of a flat bed without using bedrails?: A Little Help needed moving to and from a bed to a chair (including a wheelchair)?: A Little Help needed standing up from a chair using your arms (e.g., wheelchair or bedside chair)?: A Little Help needed to walk in hospital room?: A Little Help needed climbing 3-5 steps with a railing? : A Lot 6 Click Score: 17    End of Session Equipment Utilized During Treatment: Gait belt Activity Tolerance: Patient tolerated treatment well Patient left: with bed alarm set;with call bell/phone within reach;in bed Nurse  Communication: Mobility status PT Visit Diagnosis: Unsteadiness on feet (R26.81);Muscle weakness (generalized) (M62.81);Difficulty in walking, not elsewhere classified (R26.2)     Time: 7371-0626 PT Time Calculation (min) (ACUTE ONLY): 27 min  Charges:  $Therapeutic Activity: 23-37 mins                     Julaine Fusi PTA 02/09/20, 5:02 PM

## 2020-02-09 NOTE — Progress Notes (Signed)
PROGRESS NOTE    Corey West  QXI:[id number] DOB: 1951/07/17 DOA: 01/28/2020 PCP: System, Pcp Not In    Brief Narrative:  Corey West a 69 y.o.malewith medical history significant forhepatic cirrhosis with esophageal varices and portal hypertension gastropathy, insulin-dependent type 2 diabetes, CKD stage IV, hypertension, hyperlipidemia, chronic venous stasis dermatitis and edema bilateral lower extremities, anxiety, and obesity who presents to the ED for evaluation of progressive volume overload.  Patient was seen by nephrology, was placed on Lasix IV drip for volume overload. Patient also had some visual hallucination with mild elevation of ammonia level. Patient also had a drop in hemoglobin, required 1 unit PRBC transfusion.  He was seen by GI, no active bleeding.   Assessment & Plan:   Principal Problem:   Volume overload Active Problems:   Cirrhosis (HCC)   Thrombocytopenia (HCC)   Chronic venous stasis dermatitis of both lower extremities   Hypertension associated with diabetes ([place])   Type 2 diabetes mellitus with diabetic nephropathy, with long-term current use of insulin (HCC)   Acute on chronic diastolic CHF (congestive heart failure) (HCC)   CKD (chronic kidney disease) stage 4, GFR 15-29 ml/min (HCC)   Hyperlipidemia associated with type 2 diabetes mellitus ([place])   Acute hypoxemic respiratory failure ([place])  #1.  Acute on chronic anemia secondary to anemia chronic kidney disease. No active bleeding.  Hemoglobin started going up today.  Continue to follow.  2.  Acute hypoxemic respiratory failure. Secondary to acute on chronic diastolic congestive heart failure.  Patient is still receiving IV Lasix, condition gradually getting better.  However, patient may need home oxygen at time of discharge.  3.  Acute on chronic diastolic congestive heart failure. Volume status gradually improving.  Renal function gradually getting better.  Continue IV Lasix drip.  4.   Liver cirrhosis with hepatic encephalopathy. Continue lactulose.  Condition stable.  5.  Chronic kidney disease stage IV. Renal function is getting better after IV Lasix drip.  Continue current treatment.  6.  Type 2 diabetes. Continue current regimen.  7.  Atrial fibrillation. Not a candidate for anticoagulation due to concern for bleeding.  8.  Thrombocytopenia. Secondary to liver cirrhosis, condition stable.  9.  Morbid obesity.  10.  Debility. Has been seen by physical therapy/Occupational Therapy.  He can barely walk a few steps.  He may need nursing placement depends on his progress over the next few days.   DVT prophylaxis:SCDS Code Status:Full code Family Communication:None Disposition Plan:  Patient came from:  Anticipated d/c place:  Barriers to d/c OR conditions which need to be met to effect a safe d/c: Not ready for discharge.  Consultants:  Nephrology  Gastroenterology  Procedures:None Antimicrobials:None    Subjective: Patient doing well today.  Overnight, his oxygen fell off, he had significant O2 desaturation. Shortness of breath is getting better today.  No cough. Some loose stools from taking lactulose.  Still have a rectal tube. No nausea vomiting or abdominal pain.  Objective: Vitals:   02/08/20 1913 02/08/20 2320 02/09/20 0455 02/09/20 0752  BP: (!) 152/51  (!) 152/56 (!) 135/56  Pulse: 68  67 (!) 59  Resp: 18 (!) '21 20 17  '$ Temp: 98.5 F (36.9 C)  98.7 F (37.1 C) 97.8 F (36.6 C)  TempSrc: Oral  Oral Oral  SpO2: 92%  100% 100%  Weight:   (!) 137.8 kg   Height:        Intake/Output Summary (Last 24 hours) at 02/09/2020 0349 Last  data filed at 02/09/2020 0752 Gross per 24 hour  Intake 1439.93 ml  Output 0 ml  Net 1439.93 ml   Filed Weights   02/07/20 0406 02/08/20 0417 02/09/20 0455  Weight: (!)  140.7 kg (!) 138 kg (!) 137.8 kg    Examination:  General exam: Appears calm and comfortable  Respiratory system: Coarse breathing sounds, no significant crackles. Respiratory effort normal. Cardiovascular system: Irregular irregular, normal rate.  No JVD, murmurs, rubs, gallops or clicks. 2+ pedal edema. Gastrointestinal system: Abdomen is nondistended, soft and nontender. No organomegaly or masses felt. Normal bowel sounds heard. Central nervous system: Alert and oriented. No focal neurological deficits. Extremities: Symmetric  Skin: No rashes, lesions or ulcers Psychiatry: Judgement and insight appear normal. Mood & affect appropriate.     Data Reviewed: I have personally reviewed following labs and imaging studies  CBC: Recent Labs  Lab 02/04/20 0411 02/04/20 1730 02/05/20 0508 02/05/20 1114 02/06/20 0528 02/07/20 0556 02/09/20 0602  WBC 8.1  --  7.1  --  5.8 5.1 6.2  NEUTROABS 7.0  --  6.0  --  4.9 4.0 4.6  HGB 6.4*   < > 7.4* 7.7* 7.4* 7.4* 7.8*  HCT 21.1*  --  23.1*  --  22.8* 23.1* 25.0*  MCV 95.5  --  90.6  --  89.1 91.3 92.3  PLT 87*  --  95*  --  108* 108* 105*   < > = values in this interval not displayed.   Basic Metabolic Panel: Recent Labs  Lab 02/04/20 0411 02/05/20 0508 02/06/20 0528 02/07/20 0556 02/09/20 0602  NA 139 138 138 141 142  K 3.8 3.7 3.5 3.6 3.2*  CL 106 106 106 109 107  CO2 '26 24 23 25 27  '$ GLUCOSE 198* 212* 199* 205* 197*  BUN 61* 70* 74* 74* 67*  CREATININE 3.30* 3.27* 3.18* 2.92* 2.66*  CALCIUM 8.6* 8.4* 8.7* 9.0 9.0  MG 1.9 2.0  --  2.0  --    GFR: Estimated Creatinine Clearance: 36.7 mL/min (A) (by C-G formula based on SCr of 2.66 mg/dL (H)). Liver Function Tests: No results for input(s): AST, ALT, ALKPHOS, BILITOT, PROT, ALBUMIN in the last 168 hours. No results for input(s): LIPASE, AMYLASE in the last 168 hours. Recent Labs  Lab 02/02/20 0910  AMMONIA 56*   Coagulation Profile: No results for input(s): INR, PROTIME  in the last 168 hours. Cardiac Enzymes: No results for input(s): CKTOTAL, CKMB, CKMBINDEX, TROPONINI in the last 168 hours. BNP (last 3 results) No results for input(s): PROBNP in the last 8760 hours. HbA1C: No results for input(s): HGBA1C in the last 72 hours. CBG: Recent Labs  Lab 02/08/20 0737 02/08/20 1203 02/08/20 1700 02/08/20 2037 02/09/20 0750  GLUCAP 192* 184* 239* 220* 165*   Lipid Profile: No results for input(s): CHOL, HDL, LDLCALC, TRIG, CHOLHDL, LDLDIRECT in the last 72 hours. Thyroid Function Tests: No results for input(s): TSH, T4TOTAL, FREET4, T3FREE, THYROIDAB in the last 72 hours. Anemia Panel: No results for input(s): VITAMINB12, FOLATE, FERRITIN, TIBC, IRON, RETICCTPCT in the last 72 hours. Sepsis Labs: No results for input(s): PROCALCITON, LATICACIDVEN in the last 168 hours.  No results found for this or any previous visit (from the past 240 hour(s)).       Radiology Studies: No results found.      Scheduled Meds: . amLODipine  5 mg Oral Daily  . atorvastatin  20 mg Oral q1800  . feeding supplement (ENSURE ENLIVE)  237 mL Oral BID  BM  . hydrALAZINE  25 mg Oral TID  . insulin aspart  0-9 Units Subcutaneous TID WC  . iron polysaccharides  150 mg Oral Daily  . lactulose  30 g Oral BID  . multivitamin with minerals  1 tablet Oral Daily  . pantoprazole  40 mg Oral BID  . sodium chloride flush  3 mL Intravenous Once  . spironolactone  25 mg Oral Daily   Continuous Infusions: . furosemide (LASIX) infusion 8 mg/hr (02/09/20 0314)  . potassium chloride 10 mEq (02/09/20 0825)     LOS: 11 days    Time spent: 28 minutes    Sharen Hones, MD [facility]   To contact the attending provider between 7A-7P or the covering provider during after hours 7P-7A, please log into the web site [email or web address] and access using universal [place] password for that web site. If you do not have the password, please call the hospital  operator.  02/09/2020, 9:07 AM

## 2020-02-10 DIAGNOSIS — K7200 Acute and subacute hepatic failure without coma: Secondary | ICD-10-CM

## 2020-02-10 DIAGNOSIS — N189 Chronic kidney disease, unspecified: Secondary | ICD-10-CM

## 2020-02-10 DIAGNOSIS — N179 Acute kidney failure, unspecified: Secondary | ICD-10-CM

## 2020-02-10 LAB — GLUCOSE, CAPILLARY
Glucose-Capillary: 169 mg/dL — ABNORMAL HIGH (ref 70–99)
Glucose-Capillary: 210 mg/dL — ABNORMAL HIGH (ref 70–99)
Glucose-Capillary: 266 mg/dL — ABNORMAL HIGH (ref 70–99)
Glucose-Capillary: 278 mg/dL — ABNORMAL HIGH (ref 70–99)

## 2020-02-10 LAB — BASIC METABOLIC PANEL
Anion gap: 7 (ref 5–15)
BUN: 63 mg/dL — ABNORMAL HIGH (ref 8–23)
CO2: 27 mmol/L (ref 22–32)
Calcium: 8.8 mg/dL — ABNORMAL LOW (ref 8.9–10.3)
Chloride: 106 mmol/L (ref 98–111)
Creatinine, Ser: 2.6 mg/dL — ABNORMAL HIGH (ref 0.61–1.24)
GFR calc Af Amer: 28 mL/min — ABNORMAL LOW (ref >60)
GFR calc non Af Amer: 24 mL/min — ABNORMAL LOW (ref >60)
Glucose, Bld: 218 mg/dL — ABNORMAL HIGH (ref 70–99)
Potassium: 3.4 mmol/L — ABNORMAL LOW (ref 3.5–5.1)
Sodium: 140 mmol/L (ref 135–145)

## 2020-02-10 LAB — CBC WITH DIFFERENTIAL/PLATELET
Abs Immature Granulocytes: 0.02 10*3/uL (ref 0.00–0.07)
Basophils Absolute: 0 10*3/uL (ref 0.0–0.1)
Basophils Relative: 1 %
Eosinophils Absolute: 0 10*3/uL (ref 0.0–0.5)
Eosinophils Relative: 1 %
HCT: 24.5 % — ABNORMAL LOW (ref 39.0–52.0)
Hemoglobin: 7.7 g/dL — ABNORMAL LOW (ref 13.0–17.0)
Immature Granulocytes: 0 %
Lymphocytes Relative: 14 %
Lymphs Abs: 0.7 10*3/uL (ref 0.7–4.0)
MCH: 29.1 pg (ref 26.0–34.0)
MCHC: 31.4 g/dL (ref 30.0–36.0)
MCV: 92.5 fL (ref 80.0–100.0)
Monocytes Absolute: 0.5 10*3/uL (ref 0.1–1.0)
Monocytes Relative: 11 %
Neutro Abs: 3.7 10*3/uL (ref 1.7–7.7)
Neutrophils Relative %: 73 %
Platelets: 104 10*3/uL — ABNORMAL LOW (ref 150–400)
RBC: 2.65 MIL/uL — ABNORMAL LOW (ref 4.22–5.81)
RDW: 14.7 % (ref 11.5–15.5)
WBC: 5 10*3/uL (ref 4.0–10.5)
nRBC: 0 % (ref 0.0–0.2)

## 2020-02-10 LAB — MAGNESIUM: Magnesium: 1.8 mg/dL (ref 1.7–2.4)

## 2020-02-10 MED ORDER — SODIUM CHLORIDE 0.9 % IV SOLN
400.0000 mg | Freq: Once | INTRAVENOUS | Status: AC
  Administered 2020-02-10: 18:00:00 400 mg via INTRAVENOUS
  Filled 2020-02-10: qty 20

## 2020-02-10 MED ORDER — INSULIN ASPART 100 UNIT/ML ~~LOC~~ SOLN
4.0000 [IU] | Freq: Three times a day (TID) | SUBCUTANEOUS | Status: DC
  Administered 2020-02-10 – 2020-02-12 (×6): 4 [IU] via SUBCUTANEOUS
  Filled 2020-02-10 (×6): qty 1

## 2020-02-10 NOTE — Progress Notes (Signed)
Inpatient Diabetes Program Recommendations  AACE/ADA: New Consensus Statement on Inpatient Glycemic Control (2015)  Target Ranges:  Prepandial:   less than 140 mg/dL      Peak postprandial:   less than 180 mg/dL (1-2 hours)      Critically ill patients:  140 - 180 mg/dL   Results for Corey West, Corey West (MRN [id number]) as of 02/10/2020 13:28  Ref. Range 02/10/2020 07:46 02/10/2020 11:36  Glucose-Capillary Latest Ref Range: 70 - 99 mg/dL 169 (H) 210 (H)   Home DM Meds: Novolog 70/30 Mix- 84 units BID    Current Orders: Novolog Sensitive Correction Scale/ SSI (0-9 units) TID AC       Novolog 3 units tid meal coverage    MD- Please consider increasing Novolog Meal Coverage:  Novolog 4 units TID with meals  (Please add the following Hold Parameters: Hold if pt eats <50% of meal, Hold if pt NPO)    --Will follow patient during hospitalization--  Tama Headings RN, MSN, BC-ADM Inpatient Diabetes Coordinator Team Pager [phone] (8a-5p)

## 2020-02-10 NOTE — Progress Notes (Signed)
Patient ID: Corey West, male   DOB: 1950-10-04, 69 y.o.   MRN: [id number] [facility] PROGRESS NOTE  Corey West [id number]:[id number] DOB: 1951-08-31 DOA: 01/28/2020 PCP: System, Pcp Not In  HPI/Subjective: Patient feeling a little bit better.  Urinating well.  Has a rectal tube in.  He is interested in going to rehab to get stronger.  Objective: Vitals:   02/10/20 0746 02/10/20 1136  BP: (!) 129/52 (!) 141/64  Pulse: 60 65  Resp:    Temp: 97.9 F (36.6 C) 98.3 F (36.8 C)  SpO2: 100% 100%    Intake/Output Summary (Last 24 hours) at 02/10/2020 1532 Last data filed at 02/10/2020 1013 Gross per 24 hour  Intake 356.08 ml  Output 1200 ml  Net -843.92 ml   Filed Weights   02/08/20 0417 02/09/20 0455 02/10/20 0500  Weight: (!) 138 kg (!) 137.8 kg (!) 136.3 kg    ROS: Review of Systems  Constitutional: Negative for fever.  Eyes: Negative for blurred vision.  Respiratory: Negative for cough and shortness of breath.   Cardiovascular: Negative for chest pain.  Gastrointestinal: Positive for diarrhea. Negative for abdominal pain, nausea and vomiting.  Genitourinary: Negative for dysuria.  Musculoskeletal: Negative for joint pain.  Neurological: Negative for dizziness.   Exam: Physical Exam  Constitutional: He is oriented to person, place, and time.  HENT:  Nose: No mucosal edema.  Mouth/Throat: No oropharyngeal exudate or posterior oropharyngeal edema.  Eyes: Conjunctivae and lids are normal.  Neck: Carotid bruit is not present.  Cardiovascular: S1 normal and S2 normal. Exam reveals no gallop.  No murmur heard. Respiratory: No respiratory distress. He has decreased breath sounds in the right lower field and the left lower field. He has no wheezes. He has no rhonchi. He has no rales.  GI: Soft. Bowel sounds are normal. There is no abdominal tenderness.  Musculoskeletal:     Right ankle: Swelling present.     Left ankle: Swelling present.  Lymphadenopathy:    He has no  cervical adenopathy.  Neurological: He is alert and oriented to person, place, and time. No cranial nerve deficit.  Skin: Skin is warm. No rash noted. Nails show no clubbing.  Psychiatric: He has a normal mood and affect.      Data Reviewed: Basic Metabolic Panel: Recent Labs  Lab 02/04/20 0411 02/04/20 0411 02/05/20 7494 02/06/20 4967 02/07/20 0556 02/09/20 0602 02/10/20 0427  NA 139   < > 138 138 141 142 140  K 3.8   < > 3.7 3.5 3.6 3.2* 3.4*  CL 106   < > 106 106 109 107 106  CO2 26   < > 24 23 25 27 27   GLUCOSE 198*   < > 212* 199* 205* 197* 218*  BUN 61*   < > 70* 74* 74* 67* 63*  CREATININE 3.30*   < > 3.27* 3.18* 2.92* 2.66* 2.60*  CALCIUM 8.6*   < > 8.4* 8.7* 9.0 9.0 8.8*  MG 1.9  --  2.0  --  2.0  --  1.8   < > = values in this interval not displayed.   CBC: Recent Labs  Lab 02/05/20 0508 02/05/20 0508 02/05/20 1114 02/06/20 0528 02/07/20 0556 02/09/20 0602 02/10/20 0427  WBC 7.1  --   --  5.8 5.1 6.2 5.0  NEUTROABS 6.0  --   --  4.9 4.0 4.6 3.7  HGB 7.4*   < > 7.7* 7.4* 7.4* 7.8* 7.7*  HCT 23.1*  --   --  22.8* 23.1* 25.0* 24.5*  MCV 90.6  --   --  89.1 91.3 92.3 92.5  PLT 95*  --   --  108* 108* 105* 104*   < > = values in this interval not displayed.   BNP (last 3 results) Recent Labs    12/17/19 0910 01/28/20 1605 01/29/20 0539  BNP 256.0* 217.0* 246.0*    CBG: Recent Labs  Lab 02/09/20 1142 02/09/20 1639 02/09/20 2058 02/10/20 0746 02/10/20 1136  GLUCAP 211* 212* 265* 169* 210*    Scheduled Meds: . amLODipine  5 mg Oral Daily  . atorvastatin  20 mg Oral q1800  . feeding supplement (ENSURE ENLIVE)  237 mL Oral BID BM  . hydrALAZINE  25 mg Oral TID  . insulin aspart  0-9 Units Subcutaneous TID WC  . insulin aspart  3 Units Subcutaneous TID WC  . iron polysaccharides  150 mg Oral Daily  . lactulose  30 g Oral BID  . multivitamin with minerals  1 tablet Oral Daily  . pantoprazole  40 mg Oral BID  . sodium chloride flush  3 mL  Intravenous Once  . spironolactone  25 mg Oral Daily   Continuous Infusions: . furosemide (LASIX) infusion 8 mg/hr (02/10/20 1329)  . iron sucrose      Assessment/Plan:  1. Acute on chronic anemia secondary to chronic blood loss and chronic kidney disease.  Hemoglobin stable for the last quite a few days.  We will give IV Alroy Bailiff for. 2. Acute hypoxic respiratory failure.  We will get pulse ox on room air with ambulation to see if he qualifies for oxygen at home.  We will also get an overnight oximetry.  Likely has underlying sleep apnea and likely will need at least oxygen at night. 3. Acute on chronic diastolic congestive heart failure and anasarca.  Patient on Lasix drip currently.  Likely will switch over to oral torsemide tomorrow 4. Liver cirrhosis with hepatic encephalopathy, chronic thrombocytopenia.  Patient on lactulose.  Hopefully rectal tube can come out shortly. 5. Acute kidney injury on chronic kidney disease stage IV.  Creatinine improving on Lasix drip. 6. Chronic kidney disease stage IV with type 2 diabetes mellitus on sliding scale insulin 7. Morbid obesity with a BMI of 44.36.  Weight loss needed 8. Generalized weakness.  Physical therapy recommending rehab 9. Right leg covered with Ace bandage       Code Status:     Code Status Orders  (From admission, onward)         Start     Ordered   01/28/20 2254  Full code  Continuous     01/28/20 2254        Code Status History    Date Active Date Inactive Code Status Order ID Comments User Context   12/14/2019 1244 12/17/2019 1814 Full Code [id number]  Ivor Costa, MD ED   11/27/2019 1619 11/30/2019 1522 Full Code [id number]  Ivor Costa, MD ED   04/28/2019 2254 04/29/2019 1722 Full Code [id number]  Loletha Grayer, MD ED   03/05/2019 1753 03/06/2019 2020 Full Code [id number]  Dustin Flock, MD Inpatient   02/03/2019 0541 02/05/2019 1436 Full Code [id number]  Harrie Foreman, MD Inpatient   01/15/2019 0047 01/15/2019 1657 Full  Code [id number]  Lance Coon, MD Inpatient   12/02/2018 1713 12/09/2018 1737 Full Code [id number]  Hillary Bow, MD ED   10/16/2018 0054 10/18/2018 1842 Full Code [id number]  [name], MD Inpatient   07/18/2018 2339 07/21/2018 1356 Full  Code [id number]  Lance Coon, MD Inpatient   06/18/2018 1609 06/19/2018 1717 Full Code [id number]  Epifanio Lesches, MD ED   12/31/2017 1856 01/01/2018 1205 Full Code [id number]  Saundra Shelling, MD Inpatient   12/25/2017 1456 12/26/2017 1618 Full Code [id number]  Epifanio Lesches, MD ED   12/16/2017 0921 12/18/2017 1635 DNR [id number]  Max Sane, MD Inpatient   12/16/2017 0305 12/16/2017 0921 Full Code [id number]  Salary, Avel Peace, MD ED   12/02/2017 2003 12/05/2017 2000 Full Code [id number]  Saundra Shelling, MD Inpatient   11/21/2017 1948 11/23/2017 1958 Full Code [id number]  Loletha Grayer, MD ED   10/23/2017 1405 10/26/2017 1402 Full Code [id number]  Bettey Costa, MD Inpatient   10/18/2017 0058 10/19/2017 1806 Full Code [id number]  Saundra Shelling, MD Inpatient   10/09/2017 2139 10/11/2017 1806 Full Code [id number]  [name], MD Inpatient   09/03/2017 2124 09/05/2017 1552 Full Code [id number]  Nicholes Mango, MD Inpatient   06/26/2017 1119 06/28/2017 1748 Full Code [id number]  Henreitta Leber, MD Inpatient   02/21/2017 2007 02/22/2017 1803 Full Code [id number]  [name], MD Inpatient   07/20/2015 1245 07/22/2015 2217 Full Code [id number]  Nicholes Mango, MD Inpatient   02/08/2015 2331 02/11/2015 1943 Full Code [id number]  Hower, Aaron Mose, MD ED   Advance Care Planning Activity     Disposition Plan: Status is: Inpatient  Dispo: The patient is from: Home              Anticipated d/c is to: Rehab              Anticipated d/c date is: 02/12/2020              Patient currently on Lasix drip and has a rectal tube.  Patient will need to have rectal tube out and transitioned over to oral torsemide prior to any disposition.  We will see if he qualifies for home  oxygen or at least nighttime oxygen prior to disposition.  Consultants:  Nephrology  Time spent: 28 minutes  [place]

## 2020-02-10 NOTE — Progress Notes (Signed)
[facility], [place] 02/10/20  Subjective:   Hospital day # 12  Urine output documented as 1.2 L over the preceding 24 hours. Creatinine now appears to have stabilized at 2.6 with a BUN of 63. Discussed transitioning the patient to torsemide tomorrow.  Objective:  Vital signs in last 24 hours:  Temp:  [97.8 F (36.6 C)-98.3 F (36.8 C)] 98.3 F (36.8 C) (05/12 1136) Pulse Rate:  [60-66] 65 (05/12 1136) Resp:  [17-20] 20 (05/12 0500) BP: (129-142)/(44-64) 141/64 (05/12 1136) SpO2:  [100 %] 100 % (05/12 1136) Weight:  [136.3 kg] 136.3 kg (05/12 0500)  Weight change: -1.588 kg Filed Weights   02/08/20 0417 02/09/20 0455 02/10/20 0500  Weight: (!) 138 kg (!) 137.8 kg (!) 136.3 kg    Intake/Output:    Intake/Output Summary (Last 24 hours) at 02/10/2020 1409 Last data filed at 02/10/2020 1013 Gross per 24 hour  Intake 746.91 ml  Output 1200 ml  Net -453.09 ml     Physical Exam: General: NAD, laying in bed  HEENT Anicteric   Pulm/lungs basilar rales, normal effort  CVS/Heart irregular  Abdomen:  Soft, NTND  Extremities: 2+ lower extremity edema  Neurologic: Alert, oriented, follows commands  Skin: Stasis dermatitis bilateral lower extremities          Basic Metabolic Panel:  Recent Labs  Lab 02/04/20 0411 02/04/20 0411 02/05/20 9357 02/05/20 0177 02/06/20 0528 02/06/20 0528 02/07/20 0556 02/09/20 0602 02/10/20 0427  NA 139   < > 138  --  138  --  141 142 140  K 3.8   < > 3.7  --  3.5  --  3.6 3.2* 3.4*  CL 106   < > 106  --  106  --  109 107 106  CO2 26   < > 24  --  23  --  '25 27 27  '$ GLUCOSE 198*   < > 212*  --  199*  --  205* 197* 218*  BUN 61*   < > 70*  --  74*  --  74* 67* 63*  CREATININE 3.30*   < > 3.27*  --  3.18*  --  2.92* 2.66* 2.60*  CALCIUM 8.6*   < > 8.4*   < > 8.7*   < > 9.0 9.0 8.8*  MG 1.9  --  2.0  --   --   --  2.0  --  1.8   < > = values in this interval not displayed.     CBC: Recent Labs  Lab  02/05/20 0508 02/05/20 0508 02/05/20 1114 02/06/20 0528 02/07/20 0556 02/09/20 0602 02/10/20 0427  WBC 7.1  --   --  5.8 5.1 6.2 5.0  NEUTROABS 6.0  --   --  4.9 4.0 4.6 3.7  HGB 7.4*   < > 7.7* 7.4* 7.4* 7.8* 7.7*  HCT 23.1*  --   --  22.8* 23.1* 25.0* 24.5*  MCV 90.6  --   --  89.1 91.3 92.3 92.5  PLT 95*  --   --  108* 108* 105* 104*   < > = values in this interval not displayed.      Lab Results  Component Value Date   HEPBSAG NON REACTIVE 01/30/2020   HEPBSAB NON REACTIVE 01/30/2020   HEPBIGM NON REACTIVE 01/30/2020      Microbiology:  No results found for this or any previous visit (from the past 240 hour(s)).  Coagulation Studies: No results for input(s): LABPROT,  INR in the last 72 hours.  Urinalysis: No results for input(s): COLORURINE, LABSPEC, PHURINE, GLUCOSEU, HGBUR, BILIRUBINUR, KETONESUR, PROTEINUR, UROBILINOGEN, NITRITE, LEUKOCYTESUR in the last 72 hours.  Invalid input(s): APPERANCEUR    Imaging: No results found.   Medications:   . furosemide (LASIX) infusion 8 mg/hr (02/10/20 1329)   . amLODipine  5 mg Oral Daily  . atorvastatin  20 mg Oral q1800  . feeding supplement (ENSURE ENLIVE)  237 mL Oral BID BM  . hydrALAZINE  25 mg Oral TID  . insulin aspart  0-9 Units Subcutaneous TID WC  . insulin aspart  3 Units Subcutaneous TID WC  . iron polysaccharides  150 mg Oral Daily  . lactulose  30 g Oral BID  . multivitamin with minerals  1 tablet Oral Daily  . pantoprazole  40 mg Oral BID  . sodium chloride flush  3 mL Intravenous Once  . 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  Not currently on ACE-I/ARB at home.  Chronic kidney disease secondary to diabetes, hypertension.  Acute renal failure secondary to acute cardiorenal syndrome 05/11 0701 - 05/12 0700 In: 836.9 [P.O.:330; I.V.:210.6; IV Piggyback:296.3] Out: 1200 [Urine:1200]  Lab Results  Component Value Date   CREATININE 2.60 (H) 02/10/2020   CREATININE 2.66 (H) 02/09/2020   CREATININE 2.92 (H) 02/07/2020    #Cirrhosis with ascites and volume overload   #Anemia of chronic kidney disease and history of GI bleed. Thrombocytopenia.  Lab Results  Component Value Date   HGB 7.7 (L) 02/10/2020    # Diabetes mellitus type II with chronic kidney disease: insulin dependent. Lab Results  Component Value Date   HGBA1C 6.6 (H) 12/15/2019    Plan -Renal function appears to have stabilized with a BUN of 63 with a creatinine of 2.6 and EGFR of 24.  Continues to have reasonable response to Lasix drip.  We will continue Lasix drip through today and start the patient on torsemide 40 mg tomorrow.  Discussed with Dr. Earleen Newport.  Further plan as patient progresses.     LOS: 12 Zianne Schubring 5/12/20212:09 PM  [place], [place]  Note: This note was prepared with Dragon dictation. Any transcription errors are unintentional

## 2020-02-11 DIAGNOSIS — E1122 Type 2 diabetes mellitus with diabetic chronic kidney disease: Secondary | ICD-10-CM

## 2020-02-11 LAB — GLUCOSE, CAPILLARY
Glucose-Capillary: 187 mg/dL — ABNORMAL HIGH (ref 70–99)
Glucose-Capillary: 202 mg/dL — ABNORMAL HIGH (ref 70–99)
Glucose-Capillary: 146 mg/dL — ABNORMAL HIGH (ref 70–99)
Glucose-Capillary: 157 mg/dL — ABNORMAL HIGH (ref 70–99)

## 2020-02-11 LAB — BASIC METABOLIC PANEL
Anion gap: 8 (ref 5–15)
BUN: 65 mg/dL — ABNORMAL HIGH (ref 8–23)
CO2: 28 mmol/L (ref 22–32)
Calcium: 8.6 mg/dL — ABNORMAL LOW (ref 8.9–10.3)
Chloride: 104 mmol/L (ref 98–111)
Creatinine, Ser: 2.49 mg/dL — ABNORMAL HIGH (ref 0.61–1.24)
GFR calc Af Amer: 30 mL/min — ABNORMAL LOW (ref >60)
GFR calc non Af Amer: 26 mL/min — ABNORMAL LOW (ref >60)
Glucose, Bld: 234 mg/dL — ABNORMAL HIGH (ref 70–99)
Potassium: 3.5 mmol/L (ref 3.5–5.1)
Sodium: 140 mmol/L (ref 135–145)

## 2020-02-11 LAB — AMMONIA: Ammonia: 80 umol/L — ABNORMAL HIGH (ref 9–35)

## 2020-02-11 LAB — HEMOGLOBIN: Hemoglobin: 7.6 g/dL — ABNORMAL LOW (ref 13.0–17.0)

## 2020-02-11 MED ORDER — INSULIN ASPART 100 UNIT/ML ~~LOC~~ SOLN: 0.0000 [IU] | Freq: Every day | SUBCUTANEOUS | Status: DC

## 2020-02-11 MED ORDER — RIFAXIMIN 550 MG PO TABS
550.0000 mg | ORAL_TABLET | Freq: Two times a day (BID) | ORAL | Status: DC
  Administered 2020-02-11 – 2020-02-12 (×3): 550 mg via ORAL
  Filled 2020-02-11 (×5): qty 1

## 2020-02-11 MED ORDER — INSULIN ASPART 100 UNIT/ML ~~LOC~~ SOLN
0.0000 [IU] | Freq: Three times a day (TID) | SUBCUTANEOUS | Status: DC
  Administered 2020-02-11: 3 [IU] via SUBCUTANEOUS
  Administered 2020-02-11: 1 [IU] via SUBCUTANEOUS
  Administered 2020-02-12: 3 [IU] via SUBCUTANEOUS
  Administered 2020-02-12: 1 [IU] via SUBCUTANEOUS
  Filled 2020-02-11 (×4): qty 1

## 2020-02-11 MED ORDER — TORSEMIDE 20 MG PO TABS
40.0000 mg | ORAL_TABLET | Freq: Every day | ORAL | Status: DC
  Administered 2020-02-11 – 2020-02-12 (×2): 40 mg via ORAL
  Filled 2020-02-11 (×2): qty 2

## 2020-02-11 NOTE — Progress Notes (Signed)
Corey West  MRN: [id number]  DOB/AGE: 06-24-1951 69 y.o.  Primary Care Physician:System, Pcp Not In  Admit date: 01/28/2020  Chief Complaint:  Chief Complaint  Patient presents with  . Hallucinations  . Groin Swelling    S-Pt presented on  01/28/2020 with  Chief Complaint  Patient presents with  . Hallucinations  . Groin Swelling  .    Pt today feels better.  Patient main questioning today's visit was which rehab center should he go to  Medications  . amLODipine  5 mg Oral Daily  . atorvastatin  20 mg Oral q1800  . feeding supplement (ENSURE ENLIVE)  237 mL Oral BID BM  . hydrALAZINE  25 mg Oral TID  . insulin aspart  0-5 Units Subcutaneous QHS  . insulin aspart  0-9 Units Subcutaneous TID WC  . insulin aspart  4 Units Subcutaneous TID WC  . iron polysaccharides  150 mg Oral Daily  . lactulose  30 g Oral BID  . multivitamin with minerals  1 tablet Oral Daily  . pantoprazole  40 mg Oral BID  . rifaximin  550 mg Oral BID  . sodium chloride flush  3 mL Intravenous Once  . spironolactone  25 mg Oral Daily  . torsemide  40 mg Oral Daily         HTD:SKAJG from the symptoms mentioned above,there are no other symptoms referable to all systems reviewed.  Physical Exam: Vital signs in last 24 hours: Temp:  [97.6 F (36.4 C)-98.8 F (37.1 C)] 97.9 F (36.6 C) (05/13 1149) Pulse Rate:  [57-73] 57 (05/13 1149) Resp:  [20] 20 (05/13 0446) BP: (124-152)/(35-66) 126/45 (05/13 1149) SpO2:  [96 %-100 %] 98 % (05/13 1149) Weight:  [138.8 kg] 138.8 kg (05/13 0446) Weight change: 2.54 kg Last BM Date: 02/11/20  Intake/Output from previous day: 05/12 0701 - 05/13 0700 In: 334.5 [P.O.:240; I.V.:87.7; IV Piggyback:6.9] Out: 850 [Urine:850] Total I/O In: 240 [P.O.:240] Out: -    Physical Exam: General- pt is awake,alert, oriented to time place and person Resp- No acute REsp distress, CTA B/L NO Rhonchi CVS- S1S2 regular in rate and rhythm GIT- BS+, soft, NT, ND EXT-  1+ LE Edema, no Cyanosis   Lab Results: CBC Recent Labs    02/09/20 0602 02/09/20 0602 02/10/20 0427 02/11/20 0429  WBC 6.2  --  5.0  --   HGB 7.8*   < > 7.7* 7.6*  HCT 25.0*  --  24.5*  --   PLT 105*  --  104*  --    < > = values in this interval not displayed.    BMET Recent Labs    02/10/20 0427 02/11/20 0429  NA 140 140  K 3.4* 3.5  CL 106 104  CO2 27 28  GLUCOSE 218* 234*  BUN 63* 65*  CREATININE 2.60* 2.49*  CALCIUM 8.8* 8.6*    Creatinine trend 2021 3.3===>2.5  2.4--2.7 baseline 2020 1.7--2.4 2019 1.6--2.4 2018 1.9--2.3 2017 1.9 2016 2.0--2.4 2015 1.8--2.6 2014 1.8  MICRO No results found for this or any previous visit (from the past 240 hour(s)).    Lab Results  Component Value Date   PTH 48 01/30/2020   CALCIUM 8.6 (L) 02/11/2020   PHOS 3.2 01/30/2020   Albumin 3.2 Corrected calcium is 8.6+0.6 with a goal of 9.2            Impression:   Patient is a 69 year old Caucasian male with a past medical history of liver cirrhosis secondary  to Breese and alcohol, obstructive sleep apnea, hypertension, diabetes mellitus type 2, Barrett's disease, history of G AVE syndrome, hyperlipidemia who was admitted on April 29 with chief complaint of orthopnea, scrotal swelling, volume overload and increased ammonia level  1)Renal  AKI secondary to cardiorenal syndrome Patient has AKI on CKD Patient has CKD stage IV Patient has CKD since 2014 Patient has CKD secondary to diabetes mellitus There is possible contribution from cardiorenal syndrome/hypertension/obesity related glomerulopathy.  AKI is now better Creatinine is now back to baseline  2)HTN Blood pressure is stable   3)Anemia of chronic disease  HGb is not at goal (9--11)   4) hypocalcemia Patient calcium is normal when corrected for low albumin   5) volume overload Patient was admitted with volume overload Patient is is clinically better  6) electrolytes    sodium Normonatremic   potassium Hypokalemia, now better    7)Acid base Co2 at goal     Plan:   Patient has now been transitioned from IV diuretics to p.o. diuretics Patient is clinically stable We will continue current treatment    Saniyya Gau s [facility] 02/11/2020, 3:07 PM

## 2020-02-11 NOTE — Progress Notes (Addendum)
Inpatient Diabetes Program Recommendations  AACE/ADA: New Consensus Statement on Inpatient Glycemic Control (2015)  Target Ranges:  Prepandial:   less than 140 mg/dL      Peak postprandial:   less than 180 mg/dL (1-2 hours)      Critically ill patients:  140 - 180 mg/dL    Results for Corey West, Corey West (MRN [id number]) as of 02/11/2020 09:27  Ref. Range 02/10/2020 07:46 02/10/2020 11:36 02/10/2020 16:22 02/10/2020 21:03 02/11/2020 08:18  Glucose-Capillary Latest Ref Range: 70 - 99 mg/dL 169 (H)  Novolog 5 units 210 (H)  Novolog 6 units 266 (H)  Novolog 9 units 278 (H) 187 (H)   Home DM Meds: Novolog 70/30 Mix- 84 units BID    Current Orders: Novolog Sensitive Correction Scale/ SSI (0-9 units) TID AC       Novolog 4 units tid meal coverage    MD- Please consider:  -Consider Levemir 8 units  - Consider adding Novolog hs scale for bedtime coverage if glucose elevated >200.  --Will follow patient during hospitalization--  Tama Headings RN, MSN, BC-ADM Inpatient Diabetes Coordinator Team Pager [phone] (8a-5p)

## 2020-02-11 NOTE — Progress Notes (Signed)
OT Cancellation Note  Patient Details Name: FREEDOM PEDDY MRN: [id number] DOB: 02-Jul-1951   Cancelled Treatment:    Reason Eval/Treat Not Completed: Patient declined, no reason specified   Attempted to engage pt in tx session, but pt politely declined.  Pt declined OTR when offered to return this afternoon as well.  Will follow up at next opportunity.  Myrtie Hawk Frimy Uffelman, OTR/L 02/11/20, 11:55 AM

## 2020-02-11 NOTE — Progress Notes (Signed)
Patient ID: Corey West, male   DOB: Feb 10, 1951, 69 y.o.   MRN: [id number] [facility] PROGRESS NOTE  Corey West GQQ:[id number] DOB: 1950-11-11 DOA: 01/28/2020 PCP: System, Pcp Not In  HPI/Subjective: Patient was sitting up with physical therapy today and going to work with them.  He is feeling a little bit better.  Still urinating very well.  We are trying to get him off the oxygen during the day.  Objective: Vitals:   02/11/20 0818 02/11/20 1149  BP: (!) 143/50 (!) 126/45  Pulse: (!) 59 (!) 57  Resp:    Temp: 97.6 F (36.4 C) 97.9 F (36.6 C)  SpO2: 96% 98%    Intake/Output Summary (Last 24 hours) at 02/11/2020 1329 Last data filed at 02/11/2020 0950 Gross per 24 hour  Intake 334.54 ml  Output 850 ml  Net -515.46 ml   Filed Weights   02/09/20 0455 02/10/20 0500 02/11/20 0446  Weight: (!) 137.8 kg (!) 136.3 kg (!) 138.8 kg    ROS: Review of Systems  Constitutional: Negative for fever.  Eyes: Negative for blurred vision.  Respiratory: Negative for cough and shortness of breath.   Cardiovascular: Negative for chest pain.  Gastrointestinal: Positive for diarrhea. Negative for abdominal pain, nausea and vomiting.  Genitourinary: Negative for dysuria.  Musculoskeletal: Negative for joint pain.  Neurological: Negative for dizziness.   Exam: Physical Exam  Constitutional: He is oriented to person, place, and time.  HENT:  Nose: No mucosal edema.  Mouth/Throat: No oropharyngeal exudate or posterior oropharyngeal edema.  Eyes: Conjunctivae and lids are normal.  Neck: Carotid bruit is not present.  Cardiovascular: S1 normal and S2 normal. Exam reveals no gallop.  No murmur heard. Respiratory: No respiratory distress. He has decreased breath sounds in the right lower field and the left lower field. He has no wheezes. He has no rhonchi. He has no rales.  GI: Soft. Bowel sounds are normal. There is no abdominal tenderness.  Musculoskeletal:     Right ankle: Swelling  present.     Left ankle: Swelling present.  Lymphadenopathy:    He has no cervical adenopathy.  Neurological: He is alert and oriented to person, place, and time. No cranial nerve deficit.  Skin: Skin is warm. No rash noted. Nails show no clubbing.  Psychiatric: He has a normal mood and affect.      Data Reviewed: Basic Metabolic Panel: Recent Labs  Lab 02/05/20 0508 02/05/20 6712 02/06/20 0528 02/07/20 0556 02/09/20 0602 02/10/20 0427 02/11/20 0429  NA 138   < > 138 141 142 140 140  K 3.7   < > 3.5 3.6 3.2* 3.4* 3.5  CL 106   < > 106 109 107 106 104  CO2 24   < > 23 25 27 27 28   GLUCOSE 212*   < > 199* 205* 197* 218* 234*  BUN 70*   < > 74* 74* 67* 63* 65*  CREATININE 3.27*   < > 3.18* 2.92* 2.66* 2.60* 2.49*  CALCIUM 8.4*   < > 8.7* 9.0 9.0 8.8* 8.6*  MG 2.0  --   --  2.0  --  1.8  --    < > = values in this interval not displayed.   CBC: Recent Labs  Lab 02/05/20 0508 02/05/20 1114 02/06/20 0528 02/07/20 0556 02/09/20 0602 02/10/20 0427 02/11/20 0429  WBC 7.1  --  5.8 5.1 6.2 5.0  --   NEUTROABS 6.0  --  4.9 4.0 4.6 3.7  --  HGB 7.4*   < > 7.4* 7.4* 7.8* 7.7* 7.6*  HCT 23.1*  --  22.8* 23.1* 25.0* 24.5*  --   MCV 90.6  --  89.1 91.3 92.3 92.5  --   PLT 95*  --  108* 108* 105* 104*  --    < > = values in this interval not displayed.   BNP (last 3 results) Recent Labs    12/17/19 0910 01/28/20 1605 01/29/20 0539  BNP 256.0* 217.0* 246.0*    CBG: Recent Labs  Lab 02/10/20 1136 02/10/20 1622 02/10/20 2103 02/11/20 0818 02/11/20 1150  GLUCAP 210* 266* 278* 187* 202*    Scheduled Meds: . amLODipine  5 mg Oral Daily  . atorvastatin  20 mg Oral q1800  . feeding supplement (ENSURE ENLIVE)  237 mL Oral BID BM  . hydrALAZINE  25 mg Oral TID  . insulin aspart  0-5 Units Subcutaneous QHS  . insulin aspart  0-9 Units Subcutaneous TID WC  . insulin aspart  4 Units Subcutaneous TID WC  . iron polysaccharides  150 mg Oral Daily  . lactulose  30 g  Oral BID  . multivitamin with minerals  1 tablet Oral Daily  . pantoprazole  40 mg Oral BID  . rifaximin  550 mg Oral BID  . sodium chloride flush  3 mL Intravenous Once  . spironolactone  25 mg Oral Daily  . torsemide  40 mg Oral Daily    Assessment/Plan:  1. Acute on chronic anemia secondary to chronic blood loss and chronic kidney disease.  Hemoglobin stable but low.  I gave IV iron yesterday. 2. Acute hypoxic respiratory failure.  Looks like able to come off the oxygen during the day.  He did desaturate at night so he will qualify for 2 L of oxygen nightly.  Likely has an underlying sleep apnea. 3. Acute on chronic diastolic congestive heart failure and anasarca.  Patient transitioned off Lasix drip today and started on torsemide 40 mg daily.  Recheck labs tomorrow. 4. Liver cirrhosis with hepatic encephalopathy, chronic thrombocytopenia.  Patient on lactulose.  Start Xifaxan.  See if we can remove rectal tube today. 5. Acute kidney injury on chronic kidney disease stage IV.  Creatinine was as high as 3.3 and came down to 2.49. 6. Chronic kidney disease stage IV with type 2 diabetes mellitus on sliding scale insulin 7. Morbid obesity.  Continued weight loss needed. 8. Generalized weakness.  Physical therapy recommending rehab 9. Right leg covered with Ace bandage       Code Status:     Code Status Orders  (From admission, onward)         Start     Ordered   01/28/20 2254  Full code  Continuous     01/28/20 2254        Code Status History    Date Active Date Inactive Code Status Order ID Comments User Context   12/14/2019 1244 12/17/2019 1814 Full Code [id number]  Ivor Costa, MD ED   11/27/2019 1619 11/30/2019 1522 Full Code [id number]  Ivor Costa, MD ED   04/28/2019 2254 04/29/2019 1722 Full Code [id number]  Loletha Grayer, MD ED   03/05/2019 1753 03/06/2019 2020 Full Code [id number]  Dustin Flock, MD Inpatient   02/03/2019 0541 02/05/2019 1436 Full Code [id number]  Harrie Foreman, MD Inpatient   01/15/2019 0047 01/15/2019 1657 Full Code [id number]  Lance Coon, MD Inpatient   12/02/2018 1713 12/09/2018 1737 Full Code [id number]  Hillary Bow, MD  ED   10/16/2018 0054 10/18/2018 1842 Full Code [id number]  [name], MD Inpatient   07/18/2018 2339 07/21/2018 1356 Full Code [id number]  Lance Coon, MD Inpatient   06/18/2018 1609 06/19/2018 1717 Full Code [id number]  Epifanio Lesches, MD ED   12/31/2017 1856 01/01/2018 1205 Full Code [id number]  Saundra Shelling, MD Inpatient   12/25/2017 1456 12/26/2017 1618 Full Code [id number]  Epifanio Lesches, MD ED   12/16/2017 0921 12/18/2017 1635 DNR [id number]  Max Sane, MD Inpatient   12/16/2017 0305 12/16/2017 0921 Full Code [id number]  Salary, Avel Peace, MD ED   12/02/2017 2003 12/05/2017 2000 Full Code [id number]  Saundra Shelling, MD Inpatient   11/21/2017 1948 11/23/2017 1958 Full Code [id number]  Loletha Grayer, MD ED   10/23/2017 1405 10/26/2017 1402 Full Code [id number]  Bettey Costa, MD Inpatient   10/18/2017 0058 10/19/2017 1806 Full Code [id number]  Saundra Shelling, MD Inpatient   10/09/2017 2139 10/11/2017 1806 Full Code [id number]  [name], MD Inpatient   09/03/2017 2124 09/05/2017 1552 Full Code [id number]  Nicholes Mango, MD Inpatient   06/26/2017 1119 06/28/2017 1748 Full Code [id number]  Henreitta Leber, MD Inpatient   02/21/2017 2007 02/22/2017 1803 Full Code [id number]  [name], MD Inpatient   07/20/2015 1245 07/22/2015 2217 Full Code [id number]  Nicholes Mango, MD Inpatient   02/08/2015 2331 02/11/2015 1943 Full Code [id number]  Hower, Aaron Mose, MD ED   Advance Care Planning Activity     Disposition Plan: Status is: Inpatient  Dispo: The patient is from: Home              Anticipated d/c is to: Rehab              Anticipated d/c date is: 02/12/2020              Patient currently on Lasix drip this morning will be transitioned over to torsemide today.  We will see if we can remove rectal tube today.  Will not be  able to go out to rehab with a rectal tube.  Hopefully can go out to rehab tomorrow.  Consultants:  Nephrology  Time spent: 28 minutes  [place]

## 2020-02-11 NOTE — Progress Notes (Signed)
Physical Therapy Treatment Patient Details Name: Corey West MRN: [id number] DOB: 1951/08/09 Today's Date: 02/11/2020    History of Present Illness Pt. is a 69 y.o. male who was admitted to [facility] with LE edema, and scrotal pain. Pt. PMHx includes: Hepatic Cirrhosis, with espohageal Varices, portal HTN, gastropathy, IDDM Type 2, and CKD Stage 4, HTN, Hyperlipidemia, Chronic Venous status dermatitis, and Anxiety.    PT Comments    Pt was long sitting in bed upon arriving. He agrees to PT session and is cooperative and pleasant throughout. Pt on rm air throughout session with sao2 > 91% throughout. He reports no pain and was motivated to trial OOB activity. Required mod assist to safely get in/out of bed with max vcs for technique and sequencing. Did have c/o nausea upon initial sitting up that resolved after a few minutes. He stood and ambulated to doorway of room with use of bariatric RW. No LOB however pt very fatigued with SOB noted. Sao2 > 94% during gait training. He was repositioned supine in bed at end of session with call bell in reach, bed alarm in place, and pt resting comfortably. Acute PT will continue to follow per current POC.     Follow Up Recommendations  SNF     Equipment Recommendations  None recommended by PT;Other (comment)    Recommendations for Other Services       Precautions / Restrictions Precautions Precautions: Fall Restrictions Weight Bearing Restrictions: No    Mobility  Bed Mobility Overal bed mobility: Needs Assistance Bed Mobility: Supine to Sit     Supine to sit: Mod assist;HOB elevated Sit to supine: Mod assist;HOB elevated   General bed mobility comments: Pt required mod assist to achieve EOB short sit with vcs throughout for sequencing and safety. BP/HR/O2 all stable throughout on rm air. MD arrived to rm during session while pt was seated EOB.   Transfers Overall transfer level: Needs assistance Equipment used: Rolling walker (2  wheeled)(Bariatric) Transfers: Sit to/from Stand Sit to Stand: From elevated surface;Min assist         General transfer comment: Pt was able to safely STS from EOB with min assist + vcs for proper technique and hand placement. Poor eccentric lowering to sitting after ambulation to doorway 2/2 to fatigue.  Ambulation/Gait Ambulation/Gait assistance: Min guard Gait Distance (Feet): 20 Feet Assistive device: Rolling walker (2 wheeled) Gait Pattern/deviations: Step-through pattern Gait velocity: decreased   General Gait Details: pt demonstrated safet gait kinematics however very limited by fatigue   Stairs             Wheelchair Mobility    Modified Rankin (Stroke Patients Only)       Balance Overall balance assessment: Needs assistance Sitting-balance support: No upper extremity supported Sitting balance-Leahy Scale: Good Sitting balance - Comments:  supervision while seated EOB   Standing balance support: Bilateral upper extremity supported Standing balance-Leahy Scale: Fair Standing balance comment: no LOB with BUE support however is unsteady when lets go of RW to reach with single UE support.                            Cognition Arousal/Alertness: Awake/alert Behavior During Therapy: WFL for tasks assessed/performed Overall Cognitive Status: Within Functional Limits for tasks assessed                                 General  Comments: poor insight of deficits/ health concerns      Exercises      General Comments        Pertinent Vitals/Pain Pain Assessment: No/denies pain    Home Living                      Prior Function            PT Goals (current goals can now be found in the care plan section) Acute Rehab PT Goals Patient Stated Goal: To return home Progress towards PT goals: Progressing toward goals    Frequency    Min 2X/week      PT Plan Current plan remains appropriate    Co-evaluation      PT goals addressed during session: Mobility/safety with mobility;Balance;Strengthening/ROM;Proper use of DME        AM-PAC PT "6 Clicks" Mobility   Outcome Measure  Help needed turning from your back to your side while in a flat bed without using bedrails?: A Lot Help needed moving from lying on your back to sitting on the side of a flat bed without using bedrails?: A Lot Help needed moving to and from a bed to a chair (including a wheelchair)?: A Little Help needed standing up from a chair using your arms (e.g., wheelchair or bedside chair)?: A Little Help needed to walk in hospital room?: A Little Help needed climbing 3-5 steps with a railing? : A Lot 6 Click Score: 15    End of Session Equipment Utilized During Treatment: Gait belt Activity Tolerance: Patient tolerated treatment well Patient left: with bed alarm set;with call bell/phone within reach;in bed Nurse Communication: Mobility status PT Visit Diagnosis: Unsteadiness on feet (R26.81);Muscle weakness (generalized) (M62.81);Difficulty in walking, not elsewhere classified (R26.2)     Time: 9702-6378 PT Time Calculation (min) (ACUTE ONLY): 24 min  Charges:  $Gait Training: 8-22 mins $Therapeutic Activity: 8-22 mins                     Julaine Fusi PTA 02/11/20, 12:24 PM

## 2020-02-12 DIAGNOSIS — D500 Iron deficiency anemia secondary to blood loss (chronic): Secondary | ICD-10-CM

## 2020-02-12 LAB — BASIC METABOLIC PANEL
Anion gap: 8 (ref 5–15)
BUN: 66 mg/dL — ABNORMAL HIGH (ref 8–23)
CO2: 27 mmol/L (ref 22–32)
Calcium: 8.6 mg/dL — ABNORMAL LOW (ref 8.9–10.3)
Chloride: 104 mmol/L (ref 98–111)
Creatinine, Ser: 2.72 mg/dL — ABNORMAL HIGH (ref 0.61–1.24)
GFR calc Af Amer: 27 mL/min — ABNORMAL LOW (ref >60)
GFR calc non Af Amer: 23 mL/min — ABNORMAL LOW (ref >60)
Glucose, Bld: 163 mg/dL — ABNORMAL HIGH (ref 70–99)
Potassium: 3.3 mmol/L — ABNORMAL LOW (ref 3.5–5.1)
Sodium: 139 mmol/L (ref 135–145)

## 2020-02-12 LAB — HEMOGLOBIN: Hemoglobin: 7.6 g/dL — ABNORMAL LOW (ref 13.0–17.0)

## 2020-02-12 LAB — TYPE AND SCREEN
ABO/RH(D): A NEG
Antibody Screen: NEGATIVE

## 2020-02-12 LAB — GLUCOSE, CAPILLARY
Glucose-Capillary: 141 mg/dL — ABNORMAL HIGH (ref 70–99)
Glucose-Capillary: 217 mg/dL — ABNORMAL HIGH (ref 70–99)

## 2020-02-12 LAB — SARS CORONAVIRUS 2 BY RT PCR (HOSPITAL ORDER, PERFORMED IN ~~LOC~~ HOSPITAL LAB): SARS Coronavirus 2: NEGATIVE

## 2020-02-12 MED ORDER — INSULIN ASPART 100 UNIT/ML ~~LOC~~ SOLN: 4.0000 [IU] | Freq: Three times a day (TID) | SUBCUTANEOUS | 0 refills | Status: DC

## 2020-02-12 MED ORDER — COMBIVENT RESPIMAT 20-100 MCG/ACT IN AERS: 1.0000 | INHALATION_SPRAY | Freq: Four times a day (QID) | RESPIRATORY_TRACT | 0 refills | Status: DC

## 2020-02-12 MED ORDER — INSULIN DETEMIR 100 UNIT/ML ~~LOC~~ SOLN
8.0000 [IU] | Freq: Every day | SUBCUTANEOUS | Status: DC
  Administered 2020-02-12: 8 [IU] via SUBCUTANEOUS
  Filled 2020-02-12: qty 0.08

## 2020-02-12 MED ORDER — INSULIN DETEMIR 100 UNIT/ML ~~LOC~~ SOLN: 8.0000 [IU] | Freq: Every day | SUBCUTANEOUS | 0 refills | Status: DC

## 2020-02-12 MED ORDER — SALINE SPRAY 0.65 % NA SOLN: 1.0000 | NASAL | 0 refills | Status: DC | PRN

## 2020-02-12 MED ORDER — LORAZEPAM 0.5 MG PO TABS: 0.5000 mg | ORAL_TABLET | Freq: Three times a day (TID) | ORAL | 0 refills | Status: DC | PRN

## 2020-02-12 MED ORDER — POLYSACCHARIDE IRON COMPLEX 150 MG PO CAPS: 150.0000 mg | ORAL_CAPSULE | Freq: Every day | ORAL | 0 refills | Status: DC

## 2020-02-12 MED ORDER — ENSURE ENLIVE PO LIQD: 237.0000 mL | Freq: Two times a day (BID) | ORAL | 0 refills | Status: DC

## 2020-02-12 MED ORDER — SPIRONOLACTONE 25 MG PO TABS: 25.0000 mg | ORAL_TABLET | Freq: Every day | ORAL | 0 refills | Status: DC

## 2020-02-12 MED ORDER — TORSEMIDE 20 MG PO TABS: 40.0000 mg | ORAL_TABLET | Freq: Every day | ORAL | 0 refills | Status: DC

## 2020-02-12 MED ORDER — LACTULOSE 10 GM/15ML PO SOLN: 30.0000 g | Freq: Two times a day (BID) | ORAL | 0 refills | Status: DC

## 2020-02-12 MED ORDER — POTASSIUM CHLORIDE CRYS ER 20 MEQ PO TBCR
20.0000 meq | EXTENDED_RELEASE_TABLET | Freq: Once | ORAL | Status: AC
  Administered 2020-02-12: 20 meq via ORAL
  Filled 2020-02-12: qty 1

## 2020-02-12 MED ORDER — TRAMADOL HCL 50 MG PO TABS: 50.0000 mg | ORAL_TABLET | Freq: Four times a day (QID) | ORAL | 0 refills | Status: DC | PRN

## 2020-02-12 MED ORDER — AMLODIPINE BESYLATE 5 MG PO TABS: 5.0000 mg | ORAL_TABLET | Freq: Every day | ORAL | 0 refills | Status: DC

## 2020-02-12 MED ORDER — RIFAXIMIN 550 MG PO TABS: 550.0000 mg | ORAL_TABLET | Freq: Two times a day (BID) | ORAL | 0 refills | Status: DC

## 2020-02-12 MED ORDER — SODIUM CHLORIDE 0.9 % IV SOLN
200.0000 mg | Freq: Once | INTRAVENOUS | Status: AC
  Administered 2020-02-12: 200 mg via INTRAVENOUS
  Filled 2020-02-12: qty 10

## 2020-02-12 NOTE — Progress Notes (Signed)
Report given to nurse receiving patient at [facility]. Patient will be transferred by EMS. Patient given back belongings from security.

## 2020-02-12 NOTE — TOC Transition Note (Signed)
Transition of Care [facility]) - CM/SW Discharge Note   Patient Details  Name: Corey West MRN: [id number] Date of Birth: 01-15-1951  Transition of Care [facility]) CM/SW Contact:  Victorino Dike, RN Phone Number: 02/12/2020, 12:31 PM   Clinical Narrative:       Final next level of care: Skilled Nursing Facility Barriers to Discharge: Barriers Resolved   Patient Goals and CMS Choice   CMS Medicare.gov Compare Post Acute Care list provided to:: Patient    Discharge Placement              Patient chooses bed at: [facility]    Name of family member notified: niece and self Patient and family notified of of transfer: 02/12/20  Discharge Plan and Services     Post Acute Care Choice: Resumption of Svcs/PTA Provider                               Social Determinants of Health (SDOH) Interventions     Readmission Risk Interventions Readmission Risk Prevention Plan 02/02/2020 12/15/2019 02/03/2019  Transportation Screening Complete Complete Complete  PCP or Specialist Appt within 3-5 Days Complete - -  HRI or Home Care Consult Complete - -  Social Work Consult for [place] Planning/Counseling Complete - -  Palliative Care Screening Not Applicable - -  Medication Review Press photographer) Complete Complete Complete  PCP or Specialist appointment within 3-5 days of discharge - Complete -  [place] or [place] - Complete Complete  SW Recovery Care/Counseling Consult - - Not Complete  SW Consult Not Complete Comments - - NA  Palliative Care Screening - Not Applicable Not Applicable  Skilled Nursing Facility - Not Applicable Not Applicable  Some recent data might be hidden

## 2020-02-12 NOTE — Discharge Summary (Signed)
[place] [facility] [place] NAME: Corey West    MR#:  [id number]  DATE OF BIRTH:  07/20/1951  DATE OF ADMISSION:  01/28/2020 ADMITTING PHYSICIAN: Nolberto Hanlon, MD  DATE OF DISCHARGE: 02/12/2020  PRIMARY CARE PHYSICIAN: System, Pcp Not In    ADMISSION DIAGNOSIS:  Orthopnea [R06.01] Scrotal swelling [N50.89] Volume overload [E87.70] Increased ammonia level [R79.89]  DISCHARGE DIAGNOSIS:  Principal Problem:   Volume overload Active Problems:   Cirrhosis (HCC)   Thrombocytopenia (HCC)   Acute on chronic anemia   Acute hepatic encephalopathy   Chronic venous stasis dermatitis of both lower extremities   Acute kidney injury superimposed on CKD ([place])   Hypertension associated with diabetes ([place])   Obesity, Class III, BMI 40-49.9 (morbid obesity) ([place])   Type 2 diabetes mellitus with diabetic nephropathy, with long-term current use of insulin (HCC)   Acute on chronic diastolic CHF (congestive heart failure) (HCC)   CKD stage 4 due to type 2 diabetes mellitus ([place])   Hyperlipidemia associated with type 2 diabetes mellitus ([place])   Acute hypoxemic respiratory failure ([place])   SECONDARY DIAGNOSIS:   Past Medical History:  Diagnosis Date  . Anemia   . Anxiety    controlled;   . Arthritis   . AVM (arteriovenous malformation) of stomach, acquired with hemorrhage   . Barrett's esophagus   . Chronic kidney disease    renal infufficiency  . Cirrhosis ([place])   . Depression    controlled;   Marland Kitchen Diabetes mellitus without complication ([place])    not controlled, taking insulin but sugar continues to run high;   . Edema   . Esophageal varices ([place])   . GAVE (gastric antral vascular ectasia)   . GERD (gastroesophageal reflux disease)   . History of hiatal hernia   . Hyperlipidemia   . Hypertension    controlled well;   . Liver cirrhosis ([place])   . Nephropathy, diabetic ([place])   . Obesity   . Pancytopenia ([place])   . Polyp, stomach    with chronic blood  loss  . Sleep apnea    does not wear a cpap, Medicare would not pay for it   . Venous stasis dermatitis of both lower extremities   . Venous stasis of both lower extremities    with cellulitis    HOSPITAL COURSE:   1.  Acute on chronic anemia secondary to chronic blood loss and chronic kidney disease.  Hemoglobin stable but low.  Last hemoglobin 7.6.  Patient given IV iron 2 days ago and will repeat today prior to going out to facility.  Recommend checking hemoglobin every 2 weeks.  Patient has had numerous endoscopies in the past and since hemoglobin is stable we have not pursued that at this point 2.  Acute hypoxic respiratory failure.  Patient able to come off oxygen during the day.  He did desaturate at night so he qualifies for 2 L of oxygen nightly.  Likely has underlying sleep apnea and would benefit from a sleep study as outpatient. 3.  Acute on chronic diastolic congestive heart failure and anasarca.  The patient was on Lasix drip for a good period of time and switched over to oral torsemide yesterday.  Patient has episodes of bradycardia so beta-blocker contraindicated.  Last weight in the bed 135.807 kg 4.  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  Patient on lactulose twice a day can titrate up this dosing based on how many bowel movements he  has in a day.  Goal bowel movement is 3 bowel movements in a day.  Continue Xifaxan for hepatic encephalopathy. 5.  Acute kidney injury on chronic kidney disease stage IV.  Creatinine was as high as 3.3 and today is 2.72.  Recommend checking BMP weekly. 6.  Morbid obesity with a BMI of 44.21. 7.  Generalized weakness.  Physical therapy recommending rehab 8.  Right leg covered with Ace bandage 9.  Type 2 diabetes mellitus with chronic kidney disease stage IV.  On Levemir insulin and 4 units prior to meals.  Check fingersticks before every meal and nightly.  DISCHARGE CONDITIONS:   [place]  CONSULTS OBTAINED:   Nephrology Cardiology  DRUG ALLERGIES:  No Known Allergies  DISCHARGE MEDICATIONS:   Allergies as of 02/12/2020   No Known Allergies     Medication List    STOP taking these medications   ferrous sulfate 325 (65 FE) MG tablet   furosemide 40 MG tablet Commonly known as: LASIX   insulin aspart protamine - aspart (70-30) 100 UNIT/ML FlexPen Commonly known as: NOVOLOG 70/30 MIX   losartan 25 MG tablet Commonly known as: COZAAR   potassium chloride SA 20 MEQ tablet Commonly known as: KLOR-CON     TAKE these medications   amLODipine 5 MG tablet Commonly known as: NORVASC Take 1 tablet (5 mg total) by mouth daily. Start taking on: Feb 13, 2020   atorvastatin 10 MG tablet Commonly known as: LIPITOR Take 2 tablets (20 mg total) by mouth daily at 6 PM.   Combivent Respimat 20-100 MCG/ACT Aers respimat Generic drug: Ipratropium-Albuterol Inhale 1 puff into the lungs every 6 (six) hours.   feeding supplement (ENSURE ENLIVE) Liqd Take 237 mLs by mouth 2 (two) times daily between meals.   hydrALAZINE 25 MG tablet Commonly known as: APRESOLINE Take 1 tablet (25 mg total) by mouth 3 (three) times daily. What changed: when to take this   insulin aspart 100 UNIT/ML injection Commonly known as: novoLOG Inject 4 Units into the skin 3 (three) times daily with meals.   insulin detemir 100 UNIT/ML injection Commonly known as: LEVEMIR Inject 0.08 mLs (8 Units total) into the skin daily.   iron polysaccharides 150 MG capsule Commonly known as: NIFEREX Take 1 capsule (150 mg total) by mouth daily. Start taking on: Feb 13, 2020   lactulose 10 GM/15ML solution Commonly known as: CHRONULAC Take 45 mLs (30 g total) by mouth 2 (two) times daily. What changed: when to take this   LORazepam 0.5 MG tablet Commonly known as: ATIVAN Take 1 tablet (0.5 mg total) by mouth every 8 (eight) hours as needed for anxiety.   multivitamin tablet Take 1 tablet by mouth daily.    pantoprazole 40 MG tablet Commonly known as: Protonix Take 1 tablet (40 mg total) by mouth 2 (two) times daily.   rifaximin 550 MG Tabs tablet Commonly known as: XIFAXAN Take 1 tablet (550 mg total) by mouth 2 (two) times daily.   sodium chloride 0.65 % Soln nasal spray Commonly known as: OCEAN Place 1 spray into both nostrils as needed for congestion (nose irritation).   spironolactone 25 MG tablet Commonly known as: ALDACTONE Take 1 tablet (25 mg total) by mouth daily. Start taking on: Feb 13, 2020   torsemide 20 MG tablet Commonly known as: DEMADEX Take 2 tablets (40 mg total) by mouth daily. Start taking on: Feb 13, 2020   traMADol 50 MG tablet Commonly known as: ULTRAM Take 1 tablet (50 mg  total) by mouth every 6 (six) hours as needed for moderate pain.        DISCHARGE INSTRUCTIONS:   Follow-up team at rehab 1 day Follow-up nephrology 2 weeks  If you experience worsening of your admission symptoms, develop shortness of breath, life threatening emergency, suicidal or homicidal thoughts you must seek medical attention immediately by calling 911 or calling your MD immediately  if symptoms less severe.  You Must read complete instructions/literature along with all the possible adverse reactions/side effects for all the Medicines you take and that have been prescribed to you. Take any new Medicines after you have completely understood and accept all the possible adverse reactions/side effects.   Please note  You were cared for by a hospitalist during your hospital stay. If you have any questions about your discharge medications or the care you received while you were in the hospital after you are discharged, you can call the unit and asked to speak with the hospitalist on call if the hospitalist that took care of you is not available. Once you are discharged, your primary care physician will handle any further medical issues. Please note that NO REFILLS for any discharge  medications will be authorized once you are discharged, as it is imperative that you return to your primary care physician (or establish a relationship with a primary care physician if you do not have one) for your aftercare needs so that they can reassess your need for medications and monitor your lab values.    Today   CHIEF COMPLAINT:   Chief Complaint  Patient presents with  . Hallucinations  . Groin Swelling    HISTORY OF PRESENT ILLNESS:  Corey West  is a 69 y.o. male came in with swelling and altered mental status   VITAL SIGNS:  Blood pressure (!) 138/56, pulse (!) 48, temperature 97.8 F (36.6 C), temperature source Oral, resp. rate 17, height 5\' 9"  (1.753 m), weight 135.8 kg, SpO2 99 %.   PHYSICAL EXAMINATION:  GENERAL:  69 y.o.-year-old patient lying in the bed with no acute distress.  EYES: Pupils equal, round, reactive to light and accommodation. No scleral icterus. Extraocular muscles intact.  HEENT: Head atraumatic, normocephalic. Oropharynx and nasopharynx clear.   LUNGS: Normal breath sounds bilaterally, no wheezing, rales,rhonchi or crepitation. No use of accessory muscles of respiration.  Some upper airway congestion cleared with coughing CARDIOVASCULAR: S1, S2 normal. No murmurs, rubs, or gallops.  ABDOMEN: Soft, non-tender, non-distended. Bowel sounds present. No organomegaly or mass.  EXTREMITIES: 2+ pedal edema, no cyanosis, or clubbing.  NEUROLOGIC: Cranial nerves II through XII are intact. Muscle strength 5/5 in all extremities. Sensation intact. Gait not checked.  PSYCHIATRIC: The patient is alert and oriented x 3.  SKIN: No obvious rash, lesion, or ulcer.   DATA REVIEW:   CBC Recent Labs  Lab 02/10/20 0427 02/11/20 0429 02/12/20 0500  WBC 5.0  --   --   HGB 7.7*   < > 7.6*  HCT 24.5*  --   --   PLT 104*  --   --    < > = values in this interval not displayed.    Chemistries  Recent Labs  Lab 02/10/20 0427 02/11/20 0429 02/12/20 0500   NA 140   < > 139  K 3.4*   < > 3.3*  CL 106   < > 104  CO2 27   < > 27  GLUCOSE 218*   < > 163*  BUN 63*   < >  66*  CREATININE 2.60*   < > 2.72*  CALCIUM 8.8*   < > 8.6*  MG 1.8  --   --    < > = values in this interval not displayed.    Microbiology Results  Results for orders placed or performed during the hospital encounter of 01/28/20  Respiratory Panel by RT PCR (Flu A&B, Covid) - Nasopharyngeal Swab     Status: None   Collection Time: 01/28/20 10:34 PM   Specimen: Nasopharyngeal Swab  Result Value Ref Range Status   SARS Coronavirus 2 by RT PCR NEGATIVE NEGATIVE Final    Comment: (NOTE) SARS-CoV-2 target nucleic acids are NOT DETECTED. The SARS-CoV-2 RNA is generally detectable in upper respiratoy specimens during the acute phase of infection. The lowest concentration of SARS-CoV-2 viral copies this assay can detect is 131 copies/mL. A negative result does not preclude SARS-Cov-2 infection and should not be used as the sole basis for treatment or other patient management decisions. A negative result may occur with  improper specimen collection/handling, submission of specimen other than nasopharyngeal swab, presence of viral mutation(s) within the areas targeted by this assay, and inadequate number of viral copies (<131 copies/mL). A negative result must be combined with clinical observations, patient history, and epidemiological information. The expected result is Negative. Fact Sheet for Patients:  PinkCheek.be Fact Sheet for Healthcare Providers:  GravelBags.it This test is not yet ap proved or cleared by the [place] FDA and  has been authorized for detection and/or diagnosis of SARS-CoV-2 by FDA under an Emergency Use Authorization (EUA). This EUA will remain  in effect (meaning this test can be used) for the duration of the COVID-19 declaration under Section 564(b)(1) of the Act, 21 U.S.C. section  360bbb-3(b)(1), unless the authorization is terminated or revoked sooner.    Influenza A by PCR NEGATIVE NEGATIVE Final   Influenza B by PCR NEGATIVE NEGATIVE Final    Comment: (NOTE) The Xpert Xpress SARS-CoV-2/FLU/RSV assay is intended as an aid in  the diagnosis of influenza from Nasopharyngeal swab specimens and  should not be used as a sole basis for treatment. Nasal washings and  aspirates are unacceptable for Xpert Xpress SARS-CoV-2/FLU/RSV  testing. Fact Sheet for Patients: PinkCheek.be Fact Sheet for Healthcare Providers: GravelBags.it This test is not yet approved or cleared by the [place] FDA and  has been authorized for detection and/or diagnosis of SARS-CoV-2 by  FDA under an Emergency Use Authorization (EUA). This EUA will remain  in effect (meaning this test can be used) for the duration of the  Covid-19 declaration under Section 564(b)(1) of the Act, 21  U.S.C. section 360bbb-3(b)(1), unless the authorization is  terminated or revoked. Performed at [facility], [address], [place], [place] [address]      Management plans discussed with the patient, and he in agreement.  CODE STATUS:     Code Status Orders  (From admission, onward)         Start     Ordered   01/28/20 2254  Full code  Continuous     01/28/20 2254        Code Status History    Date Active Date Inactive Code Status Order ID Comments User Context   12/14/2019 1244 12/17/2019 1814 Full Code [id number]  Ivor Costa, MD ED   11/27/2019 1619 11/30/2019 1522 Full Code [id number]  Ivor Costa, MD ED   04/28/2019 2254 04/29/2019 1722 Full Code [id number]  Loletha Grayer, MD ED   03/05/2019  1753 03/06/2019 2020 Full Code [id number]  Dustin Flock, MD Inpatient   02/03/2019 0541 02/05/2019 1436 Full Code [id number]  Harrie Foreman, MD Inpatient   01/15/2019 0047 01/15/2019 1657 Full Code [id number]  Lance Coon, MD Inpatient    12/02/2018 1713 12/09/2018 1737 Full Code [id number]  Hillary Bow, MD ED   10/16/2018 0054 10/18/2018 1842 Full Code [id number]  [name], MD Inpatient   07/18/2018 2339 07/21/2018 1356 Full Code [id number]  Lance Coon, MD Inpatient   06/18/2018 1609 06/19/2018 1717 Full Code [id number]  Epifanio Lesches, MD ED   12/31/2017 1856 01/01/2018 1205 Full Code [id number]  Saundra Shelling, MD Inpatient   12/25/2017 1456 12/26/2017 1618 Full Code [id number]  Epifanio Lesches, MD ED   12/16/2017 0921 12/18/2017 1635 DNR [id number]  Max Sane, MD Inpatient   12/16/2017 0305 12/16/2017 0921 Full Code [id number]  Salary, Avel Peace, MD ED   12/02/2017 2003 12/05/2017 2000 Full Code [id number]  Saundra Shelling, MD Inpatient   11/21/2017 1948 11/23/2017 1958 Full Code [id number]  Loletha Grayer, MD ED   10/23/2017 1405 10/26/2017 1402 Full Code [id number]  Bettey Costa, MD Inpatient   10/18/2017 0058 10/19/2017 1806 Full Code [id number]  Saundra Shelling, MD Inpatient   10/09/2017 2139 10/11/2017 1806 Full Code [id number]  [name], MD Inpatient   09/03/2017 2124 09/05/2017 1552 Full Code [id number]  Nicholes Mango, MD Inpatient   06/26/2017 1119 06/28/2017 1748 Full Code [id number]  Henreitta Leber, MD Inpatient   02/21/2017 2007 02/22/2017 1803 Full Code [id number]  [name], MD Inpatient   07/20/2015 1245 07/22/2015 2217 Full Code [id number]  Nicholes Mango, MD Inpatient   02/08/2015 2331 02/11/2015 1943 Full Code [id number]  Hower, Aaron Mose, MD ED   Advance Care Planning Activity      TOTAL TIME TAKING CARE OF THIS PATIENT: 35 minutes.    Loletha Grayer M.D on 02/12/2020 at 9:15 AM  Between 7am to 6pm - Pager - [phone]  After 6pm go to [email or web address] - password [email or web address]  [facility]  CC: Primary care physician; System, Pcp Not In

## 2020-02-15 ENCOUNTER — Ambulatory Visit: Payer: Medicare Other | Admitting: Family

## 2020-02-18 ENCOUNTER — Telehealth: Payer: Self-pay | Admitting: Family

## 2020-02-18 NOTE — Telephone Encounter (Signed)
Called [facility] who verified the patient is doing very well since his hospital discharge. He has been taking his medications, following a low sodium diet, exercise, and has no complaints of symptoms. [facility] so will need to verify with family the new patient CHF Clinic appointment.   Alyse Low, [place]

## 2020-02-19 NOTE — Progress Notes (Deleted)
   Patient ID: Corey West, male    DOB: 01/02/1951, 69 y.o.   MRN: [id number]  HPI  Corey West is a 69 y/o male with a history of  Echo report from 12/16/19 reviewed and showed an EF of 60-65% along with moderate LVH.  Admitted 01/28/20 due to acute on chronic anemia and HF. GI, cardiology, nephrology and wound consults obtained. Given IV iron. Needed to stay on oxygen at night. Was on lasix gtt and then transitioned to oral diuretics. No beta-blocker due to bradycardia. Discharged after 5 days.  He presents today for his initial visit with a chief complaint of    Review of Systems    Physical Exam    Assessment & Plan:  1: Chronic heart failure with preserved ejection fraction with structural changes- - NYHA class - BNP 01/29/20 was 246.0  2: HTN- - BP - saw PCP Edwina Barth) 01/07/20 - BMP 02/12/20 reviewed and showed sodium 139, potassium 3.3, creatinine 2.72 and GFR 23  3: DM- - A1c 12/15/19 was 6.6%

## 2020-02-22 ENCOUNTER — Ambulatory Visit: Payer: Medicare Other | Admitting: Family

## 2020-02-22 ENCOUNTER — Telehealth: Payer: Self-pay | Admitting: Family

## 2020-02-22 NOTE — Telephone Encounter (Signed)
Patient did not show for his Heart Failure Clinic appointment on 02/22/20. Will attempt to reschedule.

## 2020-02-23 ENCOUNTER — Emergency Department: Payer: Medicare Other

## 2020-02-23 ENCOUNTER — Inpatient Hospital Stay
Admission: EM | Admit: 2020-02-23 | Discharge: 2020-02-27 | DRG: 603 | Disposition: A | Discharge status: Skilled Nursing Facility | Payer: Medicare Other | Attending: Internal Medicine | Admitting: Internal Medicine

## 2020-02-23 ENCOUNTER — Other Ambulatory Visit: Payer: Self-pay

## 2020-02-23 ENCOUNTER — Inpatient Hospital Stay: Payer: Medicare Other

## 2020-02-23 DIAGNOSIS — F419 Anxiety disorder, unspecified: Secondary | ICD-10-CM | POA: Diagnosis present

## 2020-02-23 DIAGNOSIS — I129 Hypertensive chronic kidney disease with stage 1 through stage 4 chronic kidney disease, or unspecified chronic kidney disease: Secondary | ICD-10-CM | POA: Diagnosis present

## 2020-02-23 DIAGNOSIS — R451 Restlessness and agitation: Secondary | ICD-10-CM | POA: Diagnosis not present

## 2020-02-23 DIAGNOSIS — Z79899 Other long term (current) drug therapy: Secondary | ICD-10-CM

## 2020-02-23 DIAGNOSIS — Z8249 Family history of ischemic heart disease and other diseases of the circulatory system: Secondary | ICD-10-CM | POA: Diagnosis not present

## 2020-02-23 DIAGNOSIS — D696 Thrombocytopenia, unspecified: Secondary | ICD-10-CM | POA: Diagnosis present

## 2020-02-23 DIAGNOSIS — K219 Gastro-esophageal reflux disease without esophagitis: Secondary | ICD-10-CM | POA: Diagnosis present

## 2020-02-23 DIAGNOSIS — R601 Generalized edema: Secondary | ICD-10-CM

## 2020-02-23 DIAGNOSIS — R609 Edema, unspecified: Secondary | ICD-10-CM | POA: Diagnosis present

## 2020-02-23 DIAGNOSIS — D684 Acquired coagulation factor deficiency: Secondary | ICD-10-CM | POA: Diagnosis present

## 2020-02-23 DIAGNOSIS — K7460 Unspecified cirrhosis of liver: Secondary | ICD-10-CM | POA: Diagnosis present

## 2020-02-23 DIAGNOSIS — E66813 Obesity, class 3: Secondary | ICD-10-CM | POA: Diagnosis present

## 2020-02-23 DIAGNOSIS — K31819 Angiodysplasia of stomach and duodenum without bleeding: Secondary | ICD-10-CM | POA: Diagnosis present

## 2020-02-23 DIAGNOSIS — N184 Chronic kidney disease, stage 4 (severe): Secondary | ICD-10-CM | POA: Diagnosis present

## 2020-02-23 DIAGNOSIS — Z6841 Body Mass Index (BMI) 40.0 and over, adult: Secondary | ICD-10-CM | POA: Diagnosis not present

## 2020-02-23 DIAGNOSIS — E11649 Type 2 diabetes mellitus with hypoglycemia without coma: Secondary | ICD-10-CM | POA: Diagnosis present

## 2020-02-23 DIAGNOSIS — Z833 Family history of diabetes mellitus: Secondary | ICD-10-CM | POA: Diagnosis not present

## 2020-02-23 DIAGNOSIS — L03116 Cellulitis of left lower limb: Secondary | ICD-10-CM | POA: Diagnosis not present

## 2020-02-23 DIAGNOSIS — E785 Hyperlipidemia, unspecified: Secondary | ICD-10-CM | POA: Diagnosis present

## 2020-02-23 DIAGNOSIS — Z5329 Procedure and treatment not carried out because of patient's decision for other reasons: Secondary | ICD-10-CM | POA: Diagnosis not present

## 2020-02-23 DIAGNOSIS — I878 Other specified disorders of veins: Secondary | ICD-10-CM | POA: Diagnosis present

## 2020-02-23 DIAGNOSIS — W19XXXA Unspecified fall, initial encounter: Secondary | ICD-10-CM | POA: Diagnosis present

## 2020-02-23 DIAGNOSIS — Z794 Long term (current) use of insulin: Secondary | ICD-10-CM

## 2020-02-23 DIAGNOSIS — G4733 Obstructive sleep apnea (adult) (pediatric): Secondary | ICD-10-CM | POA: Diagnosis present

## 2020-02-23 DIAGNOSIS — K7469 Other cirrhosis of liver: Secondary | ICD-10-CM | POA: Diagnosis not present

## 2020-02-23 DIAGNOSIS — S20212A Contusion of left front wall of thorax, initial encounter: Secondary | ICD-10-CM | POA: Diagnosis present

## 2020-02-23 DIAGNOSIS — E1169 Type 2 diabetes mellitus with other specified complication: Secondary | ICD-10-CM | POA: Diagnosis present

## 2020-02-23 DIAGNOSIS — E1122 Type 2 diabetes mellitus with diabetic chronic kidney disease: Secondary | ICD-10-CM | POA: Diagnosis present

## 2020-02-23 DIAGNOSIS — K2270 Barrett's esophagus without dysplasia: Secondary | ICD-10-CM | POA: Diagnosis present

## 2020-02-23 DIAGNOSIS — Z20822 Contact with and (suspected) exposure to covid-19: Secondary | ICD-10-CM | POA: Diagnosis present

## 2020-02-23 DIAGNOSIS — D509 Iron deficiency anemia, unspecified: Secondary | ICD-10-CM | POA: Diagnosis present

## 2020-02-23 DIAGNOSIS — E1121 Type 2 diabetes mellitus with diabetic nephropathy: Secondary | ICD-10-CM

## 2020-02-23 DIAGNOSIS — I890 Lymphedema, not elsewhere classified: Secondary | ICD-10-CM

## 2020-02-23 DIAGNOSIS — Z87440 Personal history of urinary (tract) infections: Secondary | ICD-10-CM

## 2020-02-23 DIAGNOSIS — R0781 Pleurodynia: Secondary | ICD-10-CM

## 2020-02-23 DIAGNOSIS — E1165 Type 2 diabetes mellitus with hyperglycemia: Secondary | ICD-10-CM | POA: Diagnosis not present

## 2020-02-23 LAB — COMPREHENSIVE METABOLIC PANEL
ALT: 24 U/L (ref 0–44)
AST: 30 U/L (ref 15–41)
Albumin: 3.3 g/dL — ABNORMAL LOW (ref 3.5–5.0)
Alkaline Phosphatase: 74 U/L (ref 38–126)
Anion gap: 8 (ref 5–15)
BUN: 53 mg/dL — ABNORMAL HIGH (ref 8–23)
CO2: 28 mmol/L (ref 22–32)
Calcium: 8.7 mg/dL — ABNORMAL LOW (ref 8.9–10.3)
Chloride: 100 mmol/L (ref 98–111)
Creatinine, Ser: 2.65 mg/dL — ABNORMAL HIGH (ref 0.61–1.24)
GFR calc Af Amer: 27 mL/min — ABNORMAL LOW (ref >60)
GFR calc non Af Amer: 24 mL/min — ABNORMAL LOW (ref >60)
Glucose, Bld: 245 mg/dL — ABNORMAL HIGH (ref 70–99)
Potassium: 3.5 mmol/L (ref 3.5–5.1)
Sodium: 136 mmol/L (ref 135–145)
Total Bilirubin: 1.1 mg/dL (ref 0.3–1.2)
Total Protein: 7 g/dL (ref 6.5–8.1)

## 2020-02-23 LAB — CBC WITH DIFFERENTIAL/PLATELET
Abs Immature Granulocytes: 0.02 10*3/uL (ref 0.00–0.07)
Basophils Absolute: 0 10*3/uL (ref 0.0–0.1)
Basophils Relative: 0 %
Eosinophils Absolute: 0 10*3/uL (ref 0.0–0.5)
Eosinophils Relative: 0 %
HCT: 23.6 % — ABNORMAL LOW (ref 39.0–52.0)
Hemoglobin: 7.5 g/dL — ABNORMAL LOW (ref 13.0–17.0)
Immature Granulocytes: 0 %
Lymphocytes Relative: 6 %
Lymphs Abs: 0.4 10*3/uL — ABNORMAL LOW (ref 0.7–4.0)
MCH: 29 pg (ref 26.0–34.0)
MCHC: 31.8 g/dL (ref 30.0–36.0)
MCV: 91.1 fL (ref 80.0–100.0)
Monocytes Absolute: 0.6 10*3/uL (ref 0.1–1.0)
Monocytes Relative: 10 %
Neutro Abs: 4.9 10*3/uL (ref 1.7–7.7)
Neutrophils Relative %: 84 %
Platelets: 80 10*3/uL — ABNORMAL LOW (ref 150–400)
RBC: 2.59 MIL/uL — ABNORMAL LOW (ref 4.22–5.81)
RDW: 15 % (ref 11.5–15.5)
WBC: 5.8 10*3/uL (ref 4.0–10.5)
nRBC: 0 % (ref 0.0–0.2)

## 2020-02-23 LAB — BRAIN NATRIURETIC PEPTIDE: B Natriuretic Peptide: 213.9 pg/mL — ABNORMAL HIGH (ref 0.0–100.0)

## 2020-02-23 LAB — LACTIC ACID, PLASMA: Lactic Acid, Venous: 1.4 mmol/L (ref 0.5–1.9)

## 2020-02-23 LAB — TYPE AND SCREEN
ABO/RH(D): A NEG
Antibody Screen: NEGATIVE

## 2020-02-23 LAB — SARS CORONAVIRUS 2 BY RT PCR (HOSPITAL ORDER, PERFORMED IN ~~LOC~~ HOSPITAL LAB): SARS Coronavirus 2: NEGATIVE

## 2020-02-23 LAB — GLUCOSE, CAPILLARY: Glucose-Capillary: 177 mg/dL — ABNORMAL HIGH (ref 70–99)

## 2020-02-23 LAB — C-REACTIVE PROTEIN: CRP: 2.3 mg/dL — ABNORMAL HIGH (ref <1.0)

## 2020-02-23 MED ORDER — INSULIN ASPART 100 UNIT/ML ~~LOC~~ SOLN
0.0000 [IU] | Freq: Three times a day (TID) | SUBCUTANEOUS | Status: DC
  Administered 2020-02-23: 4 [IU] via SUBCUTANEOUS
  Administered 2020-02-24: 7 [IU] via SUBCUTANEOUS
  Administered 2020-02-24: 4 [IU] via SUBCUTANEOUS
  Administered 2020-02-24 – 2020-02-25 (×3): 7 [IU] via SUBCUTANEOUS
  Administered 2020-02-26 – 2020-02-27 (×4): 4 [IU] via SUBCUTANEOUS
  Administered 2020-02-27 (×2): 3 [IU] via SUBCUTANEOUS
  Filled 2020-02-23 (×12): qty 1

## 2020-02-23 MED ORDER — PANTOPRAZOLE SODIUM 40 MG PO TBEC
40.0000 mg | DELAYED_RELEASE_TABLET | Freq: Two times a day (BID) | ORAL | Status: DC
  Administered 2020-02-23 – 2020-02-27 (×8): 40 mg via ORAL
  Filled 2020-02-23 (×8): qty 1

## 2020-02-23 MED ORDER — VANCOMYCIN HCL 2000 MG/400ML IV SOLN
2000.0000 mg | Freq: Once | INTRAVENOUS | Status: AC
  Administered 2020-02-23: 2000 mg via INTRAVENOUS
  Filled 2020-02-23 (×2): qty 400

## 2020-02-23 MED ORDER — SPIRONOLACTONE 25 MG PO TABS
25.0000 mg | ORAL_TABLET | Freq: Every day | ORAL | Status: DC
  Administered 2020-02-24 – 2020-02-27 (×4): 25 mg via ORAL
  Filled 2020-02-23 (×4): qty 1

## 2020-02-23 MED ORDER — HYDROMORPHONE HCL 1 MG/ML IJ SOLN
0.5000 mg | INTRAMUSCULAR | Status: DC | PRN
  Administered 2020-02-24: 0.5 mg via INTRAVENOUS
  Filled 2020-02-23: qty 1

## 2020-02-23 MED ORDER — POLYSACCHARIDE IRON COMPLEX 150 MG PO CAPS
150.0000 mg | ORAL_CAPSULE | Freq: Every day | ORAL | Status: DC
  Administered 2020-02-24 – 2020-02-27 (×4): 150 mg via ORAL
  Filled 2020-02-23 (×5): qty 1

## 2020-02-23 MED ORDER — HEPARIN SODIUM (PORCINE) 5000 UNIT/ML IJ SOLN: 5000.0000 [IU] | Freq: Three times a day (TID) | INTRAMUSCULAR | Status: DC

## 2020-02-23 MED ORDER — ACETAMINOPHEN 650 MG RE SUPP: 650.0000 mg | Freq: Four times a day (QID) | RECTAL | Status: DC | PRN

## 2020-02-23 MED ORDER — AMLODIPINE BESYLATE 5 MG PO TABS
5.0000 mg | ORAL_TABLET | Freq: Every day | ORAL | Status: DC
  Administered 2020-02-24 – 2020-02-27 (×4): 5 mg via ORAL
  Filled 2020-02-23 (×4): qty 1

## 2020-02-23 MED ORDER — INSULIN DETEMIR 100 UNIT/ML ~~LOC~~ SOLN
8.0000 [IU] | Freq: Every day | SUBCUTANEOUS | Status: DC
  Administered 2020-02-24 – 2020-02-26 (×3): 8 [IU] via SUBCUTANEOUS
  Filled 2020-02-23 (×5): qty 0.08

## 2020-02-23 MED ORDER — ONDANSETRON HCL 4 MG PO TABS: 4.0000 mg | ORAL_TABLET | Freq: Four times a day (QID) | ORAL | Status: DC | PRN

## 2020-02-23 MED ORDER — HYDRALAZINE HCL 25 MG PO TABS
25.0000 mg | ORAL_TABLET | Freq: Three times a day (TID) | ORAL | Status: DC
  Administered 2020-02-23 – 2020-02-27 (×11): 25 mg via ORAL
  Filled 2020-02-23 (×12): qty 1

## 2020-02-23 MED ORDER — ATORVASTATIN CALCIUM 20 MG PO TABS
20.0000 mg | ORAL_TABLET | Freq: Every day | ORAL | Status: DC
  Administered 2020-02-24 – 2020-02-27 (×3): 20 mg via ORAL
  Filled 2020-02-23 (×3): qty 1

## 2020-02-23 MED ORDER — ONDANSETRON HCL 4 MG/2ML IJ SOLN
4.0000 mg | Freq: Once | INTRAMUSCULAR | Status: AC
  Administered 2020-02-23: 4 mg via INTRAVENOUS
  Filled 2020-02-23: qty 2

## 2020-02-23 MED ORDER — LACTULOSE 10 GM/15ML PO SOLN
30.0000 g | Freq: Two times a day (BID) | ORAL | Status: DC
  Administered 2020-02-23 – 2020-02-27 (×7): 30 g via ORAL
  Filled 2020-02-23 (×9): qty 60

## 2020-02-23 MED ORDER — ADULT MULTIVITAMIN W/MINERALS CH
1.0000 | ORAL_TABLET | Freq: Every day | ORAL | Status: DC
  Administered 2020-02-24 – 2020-02-27 (×4): 1 via ORAL
  Filled 2020-02-23 (×4): qty 1

## 2020-02-23 MED ORDER — LORAZEPAM 0.5 MG PO TABS
0.5000 mg | ORAL_TABLET | Freq: Three times a day (TID) | ORAL | Status: DC | PRN
  Administered 2020-02-23 – 2020-02-26 (×6): 0.5 mg via ORAL
  Filled 2020-02-23 (×6): qty 1

## 2020-02-23 MED ORDER — HYDROMORPHONE HCL 1 MG/ML IJ SOLN
1.0000 mg | Freq: Once | INTRAMUSCULAR | Status: AC
  Administered 2020-02-23: 1 mg via INTRAVENOUS
  Filled 2020-02-23: qty 1

## 2020-02-23 MED ORDER — ONDANSETRON HCL 4 MG/2ML IJ SOLN: 4.0000 mg | Freq: Four times a day (QID) | INTRAMUSCULAR | Status: DC | PRN

## 2020-02-23 MED ORDER — SODIUM CHLORIDE 0.9 % IV SOLN
2.0000 g | Freq: Once | INTRAVENOUS | Status: AC
  Administered 2020-02-23: 2 g via INTRAVENOUS
  Filled 2020-02-23: qty 20

## 2020-02-23 MED ORDER — IPRATROPIUM-ALBUTEROL 0.5-2.5 (3) MG/3ML IN SOLN
3.0000 mL | Freq: Four times a day (QID) | RESPIRATORY_TRACT | Status: DC
  Administered 2020-02-23 – 2020-02-24 (×4): 3 mL via RESPIRATORY_TRACT
  Filled 2020-02-23 (×4): qty 3

## 2020-02-23 MED ORDER — TRAZODONE HCL 50 MG PO TABS
50.0000 mg | ORAL_TABLET | Freq: Every evening | ORAL | Status: DC | PRN
  Administered 2020-02-25: 50 mg via ORAL
  Filled 2020-02-23: qty 1

## 2020-02-23 MED ORDER — ACETAMINOPHEN 325 MG PO TABS: 650.0000 mg | ORAL_TABLET | Freq: Four times a day (QID) | ORAL | Status: DC | PRN

## 2020-02-23 MED ORDER — SODIUM CHLORIDE 0.9 % IV SOLN
2.0000 g | INTRAVENOUS | Status: DC
  Administered 2020-02-24 – 2020-02-26 (×3): 2 g via INTRAVENOUS
  Filled 2020-02-23 (×2): qty 2
  Filled 2020-02-23: qty 20
  Filled 2020-02-23: qty 2

## 2020-02-23 MED ORDER — ENSURE ENLIVE PO LIQD: 237.0000 mL | Freq: Two times a day (BID) | ORAL | Status: DC

## 2020-02-23 MED ORDER — TRAMADOL HCL 50 MG PO TABS
50.0000 mg | ORAL_TABLET | Freq: Four times a day (QID) | ORAL | Status: DC | PRN
  Administered 2020-02-24 – 2020-02-27 (×3): 50 mg via ORAL
  Filled 2020-02-23 (×3): qty 1

## 2020-02-23 MED ORDER — TORSEMIDE 20 MG PO TABS
40.0000 mg | ORAL_TABLET | Freq: Every day | ORAL | Status: DC
  Administered 2020-02-24 – 2020-02-27 (×4): 40 mg via ORAL
  Filled 2020-02-23 (×4): qty 2

## 2020-02-23 MED ORDER — SALINE SPRAY 0.65 % NA SOLN
1.0000 | NASAL | Status: DC | PRN
  Filled 2020-02-23: qty 44

## 2020-02-23 MED ORDER — INSULIN ASPART 100 UNIT/ML ~~LOC~~ SOLN
0.0000 [IU] | Freq: Every day | SUBCUTANEOUS | Status: DC
  Administered 2020-02-25 – 2020-02-26 (×2): 3 [IU] via SUBCUTANEOUS
  Filled 2020-02-23 (×2): qty 1

## 2020-02-23 MED ORDER — RIFAXIMIN 550 MG PO TABS
550.0000 mg | ORAL_TABLET | Freq: Two times a day (BID) | ORAL | Status: DC
  Administered 2020-02-23 – 2020-02-27 (×8): 550 mg via ORAL
  Filled 2020-02-23 (×8): qty 1

## 2020-02-23 MED ORDER — HYDROCODONE-ACETAMINOPHEN 5-325 MG PO TABS
1.0000 | ORAL_TABLET | ORAL | Status: DC | PRN
  Administered 2020-02-23: 1 via ORAL
  Administered 2020-02-24 (×4): 2 via ORAL
  Administered 2020-02-25 (×2): 1 via ORAL
  Administered 2020-02-25: 2 via ORAL
  Administered 2020-02-26: 1 via ORAL
  Filled 2020-02-23 (×3): qty 1
  Filled 2020-02-23 (×2): qty 2
  Filled 2020-02-23: qty 1
  Filled 2020-02-23 (×3): qty 2

## 2020-02-23 NOTE — ED Provider Notes (Signed)
[facility] Emergency Department Provider Note  ____________________________________________   First MD Initiated Contact with Patient 02/23/20 1315     (approximate)  I have reviewed the triage vital signs and the nursing notes.   HISTORY  Chief Complaint Fall and Leg Swelling    HPI Corey West is a 69 y.o. male  With PMHx below here with multiple complaints. Pt's primary complaint is left rib pain s/p fall 2-3 days ago. He just returned home following a stay at [facility] for rehab after hospitalization, has since had severe left chest wall pain that is sharp, stabbing, severe and worse w/ movement. He has also noticed increasing swelling in his legs which he attributes to not being able to move around much to this pain.  He states that he has had increasing weeping and redness along his bilateral legs.  The pain has been particularly worse on his left leg, and he has begun to have streaking, a sending pain and redness along his medial thigh.  He has not had any known fevers but has had some chills.  He said poor appetite.  He states he has been having severe difficulty getting around the house due to all of these issues, so he presents for further evaluation.  Denies any head injury or loss of consciousness.        Past Medical History:  Diagnosis Date  . Anemia   . Anxiety    controlled;   . Arthritis   . AVM (arteriovenous malformation) of stomach, acquired with hemorrhage   . Barrett's esophagus   . Chronic kidney disease    renal infufficiency  . Cirrhosis ([place])   . Depression    controlled;   Marland Kitchen Diabetes mellitus without complication ([place])    not controlled, taking insulin but sugar continues to run high;   . Edema   . Esophageal varices ([place])   . GAVE (gastric antral vascular ectasia)   . GERD (gastroesophageal reflux disease)   . History of hiatal hernia   . Hyperlipidemia   . Hypertension    controlled well;   . Liver cirrhosis ([place])   .  Nephropathy, diabetic ([place])   . Obesity   . Pancytopenia ([place])   . Polyp, stomach    with chronic blood loss  . Sleep apnea    does not wear a cpap, Medicare would not pay for it   . Venous stasis dermatitis of both lower extremities   . Venous stasis of both lower extremities    with cellulitis    Patient Active Problem List   Diagnosis Date Noted  . Acute hypoxemic respiratory failure ([place]) 02/04/2020  . Volume overload 01/28/2020  . Hyperlipidemia associated with type 2 diabetes mellitus ([place]) 01/28/2020  . Liver cirrhosis ([place])   . Anxiety   . SOB (shortness of breath)   . Elevated troponin   . CKD stage 4 due to type 2 diabetes mellitus ([place])   . Unstageable pressure ulcer of sacral region ([place]) 11/28/2019  . Acute on chronic diastolic CHF (congestive heart failure) ([place]) 11/27/2019  . Type II diabetes mellitus with renal manifestations ([place]) 11/27/2019  . Cellulitis of left lower extremity 11/27/2019  . Cellulitis of lower extremity 11/27/2019  . HCAP (healthcare-associated pneumonia) 02/03/2019  . Aphasia 01/14/2019  . Acute on chronic renal failure ([place]) 01/14/2019  . Malnutrition of moderate degree 12/03/2018  . C. difficile diarrhea 12/02/2018  . Ankle fracture, left, closed, initial encounter 10/15/2018  . Ankle fracture,  right, closed, initial encounter 10/15/2018  . Ankle fracture 10/15/2018  . Pressure injury of skin 07/19/2018  . UTI (urinary tract infection) 07/18/2018  . Acute kidney injury superimposed on CKD ([place]) 01/02/2018  . 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 Goals of care, counseling/discussion   . Palliative care encounter   . Acute hepatic encephalopathy 12/16/2017  . Hepatic encephalopathy ([place]) 10/18/2017  . Hypokalemia 10/09/2017  . Hyperglycemia 10/09/2017  . Acute on chronic anemia 06/26/2017  . Sepsis ([place]) 06/12/2017  . Type 2 diabetes mellitus with diabetic nephropathy, with long-term current use of insulin ([place])  05/29/2017  . Cellulitis in diabetic foot ([place]) 02/21/2017  . Cellulitis 02/21/2017  . GI bleed 07/20/2015  . Hematemesis 02/08/2015  . Cirrhosis ([place]) 02/08/2015  . Esophageal varices ([place]) 02/08/2015  . Gastroesophageal reflux disease 02/08/2015  . Hyperlipidemia 02/08/2015  . Chronic venous stasis dermatitis of both lower extremities 10/06/2014  . Edema 05/25/2014  . Hypertension associated with diabetes ([place]) 05/25/2014  . Obesity, Class III, BMI 40-49.9 (morbid obesity) ([place]) 05/25/2014  . Sleep apnea 05/25/2014  . Barrett's esophagus 05/25/2014  . Pancytopenia ([place]) 05/25/2014  . Iron deficiency anemia 12/04/2013  . Thrombocytopenia ([place]) 12/04/2013  . Splenomegaly 11/04/2013    Past Surgical History:  Procedure Laterality Date  . ESOPHAGOGASTRODUODENOSCOPY N/A 02/09/2015   Procedure: ESOPHAGOGASTRODUODENOSCOPY (EGD);  Surgeon: Manya Silvas, MD;  Location: [facility] ENDOSCOPY;  Service: Endoscopy;  Laterality: N/A;  . ESOPHAGOGASTRODUODENOSCOPY N/A 07/22/2015   Procedure: ESOPHAGOGASTRODUODENOSCOPY (EGD);  Surgeon: Manya Silvas, MD;  Location: [facility] ENDOSCOPY;  Service: Endoscopy;  Laterality: N/A;  . ESOPHAGOGASTRODUODENOSCOPY N/A 06/26/2017   Procedure: ESOPHAGOGASTRODUODENOSCOPY (EGD);  Surgeon: Manya Silvas, MD;  Location: [facility] ENDOSCOPY;  Service: Endoscopy;  Laterality: N/A;  . ESOPHAGOGASTRODUODENOSCOPY N/A 06/27/2017   Procedure: ESOPHAGOGASTRODUODENOSCOPY (EGD);  Surgeon: Manya Silvas, MD;  Location: [facility] ENDOSCOPY;  Service: Endoscopy;  Laterality: N/A;  . ESOPHAGOGASTRODUODENOSCOPY N/A 06/28/2017   Procedure: ESOPHAGOGASTRODUODENOSCOPY (EGD);  Surgeon: Manya Silvas, MD;  Location: [facility] ENDOSCOPY;  Service: Endoscopy;  Laterality: N/A;  . ESOPHAGOGASTRODUODENOSCOPY Bilateral 10/11/2017   Procedure: ESOPHAGOGASTRODUODENOSCOPY (EGD);  Surgeon: Manya Silvas, MD;  Location: [facility] ENDOSCOPY;  Service: Endoscopy;  Laterality: Bilateral;  .  ESOPHAGOGASTRODUODENOSCOPY N/A 12/04/2017   Procedure: ESOPHAGOGASTRODUODENOSCOPY (EGD);  Surgeon: Manya Silvas, MD;  Location: [facility] ENDOSCOPY;  Service: Endoscopy;  Laterality: N/A;  . ESOPHAGOGASTRODUODENOSCOPY N/A 03/06/2019   Procedure: ESOPHAGOGASTRODUODENOSCOPY (EGD);  Surgeon: Tol[place], Benay Pike, MD;  Location: [facility] ENDOSCOPY;  Service: Gastroenterology;  Laterality: N/A;  . ESOPHAGOGASTRODUODENOSCOPY (EGD) WITH PROPOFOL N/A 07/20/2015   Procedure: ESOPHAGOGASTRODUODENOSCOPY (EGD) WITH PROPOFOL;  Surgeon: Manya Silvas, MD;  Location: [facility] ENDOSCOPY;  Service: Endoscopy;  Laterality: N/A;  . ESOPHAGOGASTRODUODENOSCOPY (EGD) WITH PROPOFOL N/A 09/16/2015   Procedure: ESOPHAGOGASTRODUODENOSCOPY (EGD) WITH PROPOFOL;  Surgeon: Manya Silvas, MD;  Location: [facility] ENDOSCOPY;  Service: Endoscopy;  Laterality: N/A;  . ESOPHAGOGASTRODUODENOSCOPY (EGD) WITH PROPOFOL N/A 03/16/2016   Procedure: ESOPHAGOGASTRODUODENOSCOPY (EGD) WITH PROPOFOL;  Surgeon: Manya Silvas, MD;  Location: [facility] ENDOSCOPY;  Service: Endoscopy;  Laterality: N/A;  . ESOPHAGOGASTRODUODENOSCOPY (EGD) WITH PROPOFOL N/A 09/14/2016   Procedure: ESOPHAGOGASTRODUODENOSCOPY (EGD) WITH PROPOFOL;  Surgeon: Manya Silvas, MD;  Location: [facility] ENDOSCOPY;  Service: Endoscopy;  Laterality: N/A;  . ESOPHAGOGASTRODUODENOSCOPY (EGD) WITH PROPOFOL N/A 11/05/2016   Procedure: ESOPHAGOGASTRODUODENOSCOPY (EGD) WITH PROPOFOL;  Surgeon: Manya Silvas, MD;  Location: [facility] ENDOSCOPY;  Service: Endoscopy;  Laterality: N/A;  . ESOPHAGOGASTRODUODENOSCOPY (EGD) WITH PROPOFOL N/A 02/06/2017   Procedure: ESOPHAGOGASTRODUODENOSCOPY (EGD) WITH PROPOFOL;  Surgeon: Manya Silvas, MD;  Location: [facility] ENDOSCOPY;  Service: Endoscopy;  Laterality: N/A;  . ESOPHAGOGASTRODUODENOSCOPY (EGD) WITH PROPOFOL N/A 04/24/2017   Procedure: ESOPHAGOGASTRODUODENOSCOPY (EGD) WITH PROPOFOL;  Surgeon: Manya Silvas, MD;  Location: [facility] ENDOSCOPY;  Service: Endoscopy;   Laterality: N/A;  . ESOPHAGOGASTRODUODENOSCOPY (EGD) WITH PROPOFOL N/A 07/31/2017   Procedure: ESOPHAGOGASTRODUODENOSCOPY (EGD) WITH PROPOFOL;  Surgeon: Manya Silvas, MD;  Location: [facility] ENDOSCOPY;  Service: Endoscopy;  Laterality: N/A;  . ESOPHAGOGASTRODUODENOSCOPY (EGD) WITH PROPOFOL N/A 08/08/2018   Procedure: ESOPHAGOGASTRODUODENOSCOPY (EGD) WITH PROPOFOL;  Surgeon: Manya Silvas, MD;  Location: [facility] ENDOSCOPY;  Service: Endoscopy;  Laterality: N/A;  . GIVENS CAPSULE STUDY N/A 12/05/2017   Procedure: GIVENS CAPSULE STUDY;  Surgeon: Manya Silvas, MD;  Location: [facility] ENDOSCOPY;  Service: Endoscopy;  Laterality: N/A;  . TONSILLECTOMY    . TONSILLECTOMY AND ADENOIDECTOMY    . ULNAR NERVE TRANSPOSITION    . UVULOPALATOPHARYNGOPLASTY      Prior to Admission medications   Medication Sig Start Date End Date Taking? Authorizing Provider  amLODipine (NORVASC) 5 MG tablet Take 1 tablet (5 mg total) by mouth daily. 02/13/20   Loletha Grayer, MD  atorvastatin (LIPITOR) 10 MG tablet Take 2 tablets (20 mg total) by mouth daily at 6 PM. 12/17/19   Nolberto Hanlon, MD  feeding supplement, ENSURE ENLIVE, (ENSURE ENLIVE) LIQD Take 237 mLs by mouth 2 (two) times daily between meals. 02/12/20   Loletha Grayer, MD  hydrALAZINE (APRESOLINE) 25 MG tablet Take 1 tablet (25 mg total) by mouth 3 (three) times daily. Patient taking differently: Take 25 mg by mouth in the morning and at bedtime.  12/17/19   Nolberto Hanlon, MD  insulin aspart (NOVOLOG) 100 UNIT/ML injection Inject 4 Units into the skin 3 (three) times daily with meals. 02/12/20   Loletha Grayer, MD  insulin detemir (LEVEMIR) 100 UNIT/ML injection Inject 0.08 mLs (8 Units total) into the skin daily. 02/12/20   Wieting, Richard, MD  Ipratropium-Albuterol (COMBIVENT RESPIMAT) 20-100 MCG/ACT AERS respimat Inhale 1 puff into the lungs every 6 (six) hours. 02/12/20   Loletha Grayer, MD  iron polysaccharides (NIFEREX) 150 MG capsule Take 1 capsule (150  mg total) by mouth daily. 02/13/20   Loletha Grayer, MD  lactulose (CHRONULAC) 10 GM/15ML solution Take 45 mLs (30 g total) by mouth 2 (two) times daily. 02/12/20   Loletha Grayer, MD  LORazepam (ATIVAN) 0.5 MG tablet Take 1 tablet (0.5 mg total) by mouth every 8 (eight) hours as needed for anxiety. 02/12/20   Loletha Grayer, MD  Multiple Vitamin (MULTIVITAMIN) tablet Take 1 tablet by mouth daily.    [provider]  pantoprazole (PROTONIX) 40 MG tablet Take 1 tablet (40 mg total) by mouth 2 (two) times daily. 03/06/19 03/05/20  Dustin Flock, MD  rifaximin (XIFAXAN) 550 MG TABS tablet Take 1 tablet (550 mg total) by mouth 2 (two) times daily. 02/12/20   Loletha Grayer, MD  sodium chloride (OCEAN) 0.65 % SOLN nasal spray Place 1 spray into both nostrils as needed for congestion (nose irritation). 02/12/20   Loletha Grayer, MD  spironolactone (ALDACTONE) 25 MG tablet Take 1 tablet (25 mg total) by mouth daily. 02/13/20   Loletha Grayer, MD  torsemide (DEMADEX) 20 MG tablet Take 2 tablets (40 mg total) by mouth daily. 02/13/20   Loletha Grayer, MD  traMADol (ULTRAM) 50 MG tablet Take 1 tablet (50 mg total) by mouth every 6 (six) hours as needed for moderate pain. 02/12/20   Loletha Grayer, MD  Allergies Patient has no known allergies.  Family History  Problem Relation Age of Onset  . Diabetes Other   . Transient ischemic attack Father   . CAD Father     Social History Social History   Tobacco Use  . Smoking status: Former Smoker    Years: 20.00    Types: Cigars, Cigarettes  . Smokeless tobacco: Never Used  Substance Use Topics  . Alcohol use: No    Comment: stopped 15 years ago  . Drug use: No    Review of Systems  Review of Systems  Constitutional: Positive for fatigue. Negative for chills and fever.  HENT: Negative for sore throat.   Respiratory: Positive for cough. Negative for shortness of breath.   Cardiovascular: Positive for leg swelling. Negative for  chest pain.  Gastrointestinal: Positive for nausea. Negative for abdominal pain.  Genitourinary: Negative for flank pain.  Musculoskeletal: Negative for neck pain.  Skin: Negative for rash and wound.  Allergic/Immunologic: Negative for immunocompromised state.  Neurological: Positive for weakness. Negative for numbness.  Hematological: Does not bruise/bleed easily.  All other systems reviewed and are negative.    ____________________________________________  PHYSICAL EXAM:      VITAL SIGNS: ED Triage Vitals  Enc Vitals Group     BP 02/23/20 1312 (!) 162/55     Pulse Rate 02/23/20 1312 85     Resp 02/23/20 1312 20     Temp 02/23/20 1312 98.8 F (37.1 C)     Temp Source 02/23/20 1312 Oral     SpO2 02/23/20 1311 98 %     Weight 02/23/20 1313 290 lb (131.5 kg)     Height 02/23/20 1313 5\' 9"  (1.753 m)     Head Circumference --      Peak Flow --      Pain Score 02/23/20 1313 8     Pain Loc --      Pain Edu? --      Excl. in [place]? --      Physical Exam Vitals and nursing note reviewed.  Constitutional:      General: He is not in acute distress.    Appearance: He is well-developed.     Comments: Morbidly obese, disheveled  HENT:     Head: Normocephalic and atraumatic.  Eyes:     Conjunctiva/sclera: Conjunctivae normal.  Cardiovascular:     Rate and Rhythm: Normal rate and regular rhythm.     Heart sounds: Normal heart sounds. No murmur. No friction rub.  Pulmonary:     Effort: Pulmonary effort is normal. No respiratory distress.     Breath sounds: Rales (Bibasilar) present. No wheezing.  Chest:     Comments: Exquisite TTP along left lateral chest wall with bruising Abdominal:     General: There is no distension.     Palpations: Abdomen is soft.     Tenderness: There is no abdominal tenderness.  Musculoskeletal:        General: Swelling present.     Cervical back: Neck supple.     Right lower leg: Edema (3+) present.     Left lower leg: Edema (3+) present.      Comments: Discoloration and erythema bilateral lower legs, but with streaking warmth extending up the left medial thigh.  Skin:    General: Skin is warm.     Capillary Refill: Capillary refill takes less than 2 seconds.  Neurological:     Mental Status: He is alert and oriented to person, place, and time.  Motor: No abnormal muscle tone.         ____________________________________________   LABS (all labs ordered are listed, but only abnormal results are displayed)  Labs Reviewed  COMPREHENSIVE METABOLIC PANEL - Abnormal; Notable for the following components:      Result Value   Glucose, Bld 245 (*)    BUN 53 (*)    Creatinine, Ser 2.65 (*)    Calcium 8.7 (*)    Albumin 3.3 (*)    GFR calc non Af Amer 24 (*)    GFR calc Af Amer 27 (*)    All other components within normal limits  BRAIN NATRIURETIC PEPTIDE - Abnormal; Notable for the following components:   B Natriuretic Peptide 213.9 (*)    All other components within normal limits  CULTURE, BLOOD (ROUTINE X 2)  CULTURE, BLOOD (ROUTINE X 2)  SARS CORONAVIRUS 2 BY RT PCR (HOSPITAL ORDER, [facility])  LACTIC ACID, PLASMA  CBC WITH DIFFERENTIAL/PLATELET  C-REACTIVE PROTEIN  LACTIC ACID, PLASMA    ____________________________________________  EKG: Normal sinus rhythm, ventricular rate 88.  QRS 119, QTc 477.  Nonspecific intraventricular delay.  No acute ST elevations or depressions. ________________________________________  RADIOLOGY All imaging, including plain films, CT scans, and ultrasounds, independently reviewed by me, and interpretations confirmed via formal radiology reads.  ED MD interpretation:   CXR: No fx, normal XR Femur: No fx  Official radiology report(s): DG Chest 2 View  Result Date: 02/23/2020 CLINICAL DATA:  Pain following fall EXAM: CHEST - 2 VIEW COMPARISON:  Feb 03, 2020 FINDINGS: Lungs are clear. Heart is mildly enlarged with pulmonary vascularity normal. No  adenopathy. No pneumothorax. No evident bone lesions. IMPRESSION: Mild cardiomegaly.  No edema or consolidation. Electronically Signed   By: Lowella Grip III M.D.   On: 02/23/2020 14:56   DG Femur Min 2 Views Left  Result Date: 02/23/2020 CLINICAL DATA:  Pain following fall EXAM: LEFT FEMUR 2 VIEWS COMPARISON:  None. FINDINGS: Frontal and lateral views obtained. No fracture or dislocation. No abnormal periosteal reaction. There is slight narrowing of the left hip joint. No knee joint effusion. There are multiple foci of arterial vascular calcification. IMPRESSION: No fracture or dislocation. Mild narrowing left hip joint. Multiple foci of arterial vascular calcification. Electronically Signed   By: Lowella Grip III M.D.   On: 02/23/2020 14:57    ____________________________________________  PROCEDURES   Procedure(s) performed (including Critical Care):  Procedures  ____________________________________________  INITIAL IMPRESSION / MDM / [place] / ED COURSE  As part of my medical decision making, I reviewed the following data within the [facility] notes reviewed and incorporated, Old chart reviewed, Notes from prior ED visits, and [place] Controlled Substance Database       *GAEGE SANGALANG was evaluated in Emergency Department on 02/23/2020 for the symptoms described in the history of present illness. He was evaluated in the context of the global COVID-19 pandemic, which necessitated consideration that the patient might be at risk for infection with the SARS-CoV-2 virus that causes COVID-19. Institutional protocols and algorithms that pertain to the evaluation of patients at risk for COVID-19 are in a state of rapid change based on information released by regulatory bodies including the CDC and federal and state organizations. These policies and algorithms were followed during the patient's care in the ED.  Some ED evaluations and interventions may be  delayed as a result of limited staffing during the pandemic.*  Medical Decision Making: 69 year old male here with multiple complaints.  Regarding his left chest wall pain, I suspect this is due to musculoskeletal pain from rib contusions and no evidence of rib fracture or pneumothorax is noted on chest x-ray.  However, his complaint of increasing bilateral leg pain and difficulty walking is concerning in the setting of significant warmth and asymmetric streaking of the left leg extending up to his medial thigh.  He has had subjective chills.  He does not appear septic, but has a history of recurrent UTIs with lymphedema and morbid obesity, with high risk of fairly aggressive cellulitis.  Will start on empiric antibiotics.  He is unable to care for himself at home.  ____________________________________________  FINAL CLINICAL IMPRESSION(S) / ED DIAGNOSES  Final diagnoses:  Left leg cellulitis  Lymphedema  Anasarca  Rib pain     MEDICATIONS GIVEN DURING THIS VISIT:  Medications  cefTRIAXone (ROCEPHIN) 2 g in sodium chloride 0.9 % 100 mL IVPB (has no administration in time range)  vancomycin (VANCOREADY) IVPB 2000 mg/400 mL (has no administration in time range)  HYDROmorphone (DILAUDID) injection 1 mg (1 mg Intravenous Given 02/23/20 1416)  ondansetron (ZOFRAN) injection 4 mg (4 mg Intravenous Given 02/23/20 1416)     ED Discharge Orders    None       Note:  This document was prepared using Dragon voice recognition software and may include unintentional dictation errors.   Duffy Bruce, MD 02/23/20 1510

## 2020-02-23 NOTE — H&P (Addendum)
History and Physical    Corey West:[id number] DOB: 02-24-1951 DOA: 02/23/2020  PCP: System, Pcp Not In   Patient coming from: home (just d/c from [facility] on 5/21)  I have personally briefly reviewed patient's old medical records in [place]  Chief Complaint: leg pain and swelling, chills  HPI: Corey West is a 69 y.o. male with medical history significant of morbid obesity, chronic lymphedema and venous stasis, recurrent UTIs.  Type 2 diabetes, CKD-4, liver cirrhosis, GAVE with hx of GI bleeds, chronic iron deficiency anemia, GERD, likely OSA, chronic thrombocytopenia, HTN, HLD present to the ED today with multiple complaints including increased weeping and swelling of his legs, left-sided rib and chest wall pain after a fall 2 or 3 days ago.  Due to the pain, patient has been unable to take care for himself.  He takes lactulose for his cirrhosis, states he often does not make it to the bathroom in time and has been in too much pain to clean himself up adequately.  Also states he is not been able to find help with wrapping his legs so they have gotten worse than normal.  Reports chills but unknown if actual fevers.  Also with poor appetite, generalized weakness and intermittent headache.  Of note, patient was discharged from the hospital on 5/14.  He went to [facility] and was just discharged home on 5/21.  ED Course: Afebrile, mildly hypertensive 162/55, otherwise normal vitals.  Labs notable for hemoglobin 7.5 (compared to 7.7 last admission), platelets 80k (slightly down from 104 prior admission), BUN 53, creatinine 2.65, glucose 245.  Chest x-ray showed mild cardiomegaly and no acute findings including rib fractures or pneumothorax.  Left femur was x-rayed and did not show any fracture dislocation.  Patient was treated with vancomycin and Rocephin in the ED and admitted to hospitalist service for further evaluation management.  Review of Systems: As per HPI otherwise 10 point  review of systems negative.    Past Medical History:  Diagnosis Date  . Anemia   . Anxiety    controlled;   . Arthritis   . AVM (arteriovenous malformation) of stomach, acquired with hemorrhage   . Barrett's esophagus   . Chronic kidney disease    renal infufficiency  . Cirrhosis ([place])   . Depression    controlled;   Marland Kitchen Diabetes mellitus without complication ([place])    not controlled, taking insulin but sugar continues to run high;   . Edema   . Esophageal varices ([place])   . GAVE (gastric antral vascular ectasia)   . GERD (gastroesophageal reflux disease)   . History of hiatal hernia   . Hyperlipidemia   . Hypertension    controlled well;   . Liver cirrhosis ([place])   . Nephropathy, diabetic ([place])   . Obesity   . Pancytopenia ([place])   . Polyp, stomach    with chronic blood loss  . Sleep apnea    does not wear a cpap, Medicare would not pay for it   . Venous stasis dermatitis of both lower extremities   . Venous stasis of both lower extremities    with cellulitis    Past Surgical History:  Procedure Laterality Date  . ESOPHAGOGASTRODUODENOSCOPY N/A 02/09/2015   Procedure: ESOPHAGOGASTRODUODENOSCOPY (EGD);  Surgeon: Manya Silvas, MD;  Location: [facility] ENDOSCOPY;  Service: Endoscopy;  Laterality: N/A;  . ESOPHAGOGASTRODUODENOSCOPY N/A 07/22/2015   Procedure: ESOPHAGOGASTRODUODENOSCOPY (EGD);  Surgeon: Manya Silvas, MD;  Location: [place];  Service: Endoscopy;  Laterality: N/A;  . ESOPHAGOGASTRODUODENOSCOPY N/A 06/26/2017   Procedure: ESOPHAGOGASTRODUODENOSCOPY (EGD);  Surgeon: Manya Silvas, MD;  Location[facility] END[facility]COPY;  Service: Endoscopy;  Laterality: N/A;  . ESOPHAGOGASTRODUODENOSCOPY N/A 06/27/2017   Procedure: ESOPHAGOGASTRODUODENOSCOPY (EGD);  Surgeon: Manya Silvas, MD;  Location: [facility] ENDOSCOPY;  Service: Endoscopy;  Laterality: N/A;  . ESOPHAGOGASTRODUODENOSCOPY N/A 06/28/2017   Procedure: ESOPHAGOGASTRODUODENOSCOPY (EGD);  Surgeon: Manya Silvas, MD;  Location: [facility] ENDOSCOPY;  Service: Endoscopy;  Laterality: N/A;  . ESOPHAGOGASTRODUODENOSCOPY Bilateral 10/11/2017   Procedure: ESOPHAGOGASTRODUODENOSCOPY (EGD);  Surgeon: Manya Silvas, MD;  Location: [facility] ENDOSCOPY;  Service: Endoscopy;  Laterality: Bilateral;  . ESOPHAGOGASTRODUODENOSCOPY N/A 12/04/2017   Procedure: ESOPHAGOGASTRODUODENOSCOPY (EGD);  Surgeon: Manya Silvas, MD;  Location: [facility] ENDOSCOPY;  Service: Endoscopy;  Laterality: N/A;  . ESOPHAGOGASTRODUODENOSCOPY N/A 03/06/2019   Procedure: ESOPHAGOGASTRODUODENOSCOPY (EGD);  Surgeon: Toledo, Benay Pike, MD;  Location: [facility] ENDOSCOPY;  Service: Gastroenterology;  Laterality: N/A;  . ESOPHAGOGASTRODUODENOSCOPY (EGD) WITH PROPOFOL N/A 07/20/2015   Procedure: ESOPHAGOGASTRODUODENOSCOPY (EGD) WITH PROPOFOL;  Surgeon: Manya Silvas, MD;  Location: [facility] ENDOSCOPY;  Service: Endoscopy;  Laterality: N/A;  . ESOPHAGOGASTRODUODENOSCOPY (EGD) WITH PROPOFOL N/A 09/16/2015   Procedure: ESOPHAGOGASTRODUODENOSCOPY (EGD) WITH PROPOFOL;  Surgeon: Manya Silvas, MD;  Location: [facility] ENDOSCOPY;  Service: Endoscopy;  Laterality: N/A;  . ESOPHAGOGASTRODUODENOSCOPY (EGD) WITH PROPOFOL N/A 03/16/2016   Procedure: ESOPHAGOGASTRODUODENOSCOPY (EGD) WITH PROPOFOL;  Surgeon: Manya Silvas, MD;  Location: [facility] ENDOSCOPY;  Service: Endoscopy;  Laterality: N/A;  . ESOPHAGOGASTRODUODENOSCOPY (EGD) WITH PROPOFOL N/A 09/14/2016   Procedure: ESOPHAGOGASTRODUODENOSCOPY (EGD) WITH PROPOFOL;  Surgeon: Manya Silvas, MD;  Location: [facility] ENDOSCOPY;  Service: Endoscopy;  Laterality: N/A;  . ESOPHAGOGASTRODUODENOSCOPY (EGD) WITH PROPOFOL N/A 11/05/2016   Procedure: ESOPHAGOGASTRODUODENOSCOPY (EGD) WITH PROPOFOL;  Surgeon: Manya Silvas, MD;  Location: [facility] ENDOSCOPY;  Service: Endoscopy;  Laterality: N/A;  . ESOPHAGOGASTRODUODENOSCOPY (EGD) WITH PROPOFOL N/A 02/06/2017   Procedure: ESOPHAGOGASTRODUODENOSCOPY (EGD) WITH PROPOFOL;  Surgeon: Manya Silvas,  MD;  Location: [facility] ENDOSCOPY;  Service: Endoscopy;  Laterality: N/A;  . ESOPHAGOGASTRODUODENOSCOPY (EGD) WITH PROPOFOL N/A 04/24/2017   Procedure: ESOPHAGOGASTRODUODENOSCOPY (EGD) WITH PROPOFOL;  Surgeon: Manya Silvas, MD;  Location: [facility] ENDOSCOPY;  Service: Endoscopy;  Laterality: N/A;  . ESOPHAGOGASTRODUODENOSCOPY (EGD) WITH PROPOFOL N/A 07/31/2017   Procedure: ESOPHAGOGASTRODUODENOSCOPY (EGD) WITH PROPOFOL;  Surgeon: Manya Silvas, MD;  Location: [facility] ENDOSCOPY;  Service: Endoscopy;  Laterality: N/A;  . ESOPHAGOGASTRODUODENOSCOPY (EGD) WITH PROPOFOL N/A 08/08/2018   Procedure: ESOPHAGOGASTRODUODENOSCOPY (EGD) WITH PROPOFOL;  Surgeon: Manya Silvas, MD;  Location: [facility] ENDOSCOPY;  Service: Endoscopy;  Laterality: N/A;  . GIVENS CAPSULE STUDY N/A 12/05/2017   Procedure: GIVENS CAPSULE STUDY;  Surgeon: Manya Silvas, MD;  Location: [facility] ENDOSCOPY;  Service: Endoscopy;  Laterality: N/A;  . TONSILLECTOMY    . TONSILLECTOMY AND ADENOIDECTOMY    . ULNAR NERVE TRANSPOSITION    . UVULOPALATOPHARYNGOPLASTY       reports that he has quit smoking. His smoking use included cigars and cigarettes. He quit after 20.00 years of use. He has never used smokeless tobacco. He reports that he does not drink alcohol or use drugs.  No Known Allergies  Family History  Problem Relation Age of Onset  . Diabetes Other   . Transient ischemic attack Father   . CAD Father     Prior to Admission medications   Medication Sig Start Date End Date Taking? Authorizing Provider  amLODipine (NORVASC) 5 MG tablet Take 1 tablet (5 mg total) by mouth daily. 02/13/20  Loletha Grayer, MD  atorvastatin (LIPITOR) 10 MG tablet Take 2 tablets (20 mg total) by mouth daily at 6 PM. 12/17/19   Nolberto Hanlon, MD  feeding supplement, ENSURE ENLIVE, (ENSURE ENLIVE) LIQD Take 237 mLs by mouth 2 (two) times daily between meals. 02/12/20   Loletha Grayer, MD  hydrALAZINE (APRESOLINE) 25 MG tablet Take 1 tablet (25 mg  total) by mouth 3 (three) times daily. Patient taking differently: Take 25 mg by mouth in the morning and at bedtime.  12/17/19   Nolberto Hanlon, MD  insulin aspart (NOVOLOG) 100 UNIT/ML injection Inject 4 Units into the skin 3 (three) times daily with meals. 02/12/20   Loletha Grayer, MD  insulin detemir (LEVEMIR) 100 UNIT/ML injection Inject 0.08 mLs (8 Units total) into the skin daily. 02/12/20   Wieting, Richard, MD  Ipratropium-Albuterol (COMBIVENT RESPIMAT) 20-100 MCG/ACT AERS respimat Inhale 1 puff into the lungs every 6 (six) hours. 02/12/20   Loletha Grayer, MD  iron polysaccharides (NIFEREX) 150 MG capsule Take 1 capsule (150 mg total) by mouth daily. 02/13/20   Loletha Grayer, MD  lactulose (CHRONULAC) 10 GM/15ML solution Take 45 mLs (30 g total) by mouth 2 (two) times daily. 02/12/20   Loletha Grayer, MD  LORazepam (ATIVAN) 0.5 MG tablet Take 1 tablet (0.5 mg total) by mouth every 8 (eight) hours as needed for anxiety. 02/12/20   Loletha Grayer, MD  Multiple Vitamin (MULTIVITAMIN) tablet Take 1 tablet by mouth daily.    [provider]  pantoprazole (PROTONIX) 40 MG tablet Take 1 tablet (40 mg total) by mouth 2 (two) times daily. 03/06/19 03/05/20  Dustin Flock, MD  rifaximin (XIFAXAN) 550 MG TABS tablet Take 1 tablet (550 mg total) by mouth 2 (two) times daily. 02/12/20   Loletha Grayer, MD  sodium chloride (OCEAN) 0.65 % SOLN nasal spray Place 1 spray into both nostrils as needed for congestion (nose irritation). 02/12/20   Loletha Grayer, MD  spironolactone (ALDACTONE) 25 MG tablet Take 1 tablet (25 mg total) by mouth daily. 02/13/20   Loletha Grayer, MD  torsemide (DEMADEX) 20 MG tablet Take 2 tablets (40 mg total) by mouth daily. 02/13/20   Loletha Grayer, MD  traMADol (ULTRAM) 50 MG tablet Take 1 tablet (50 mg total) by mouth every 6 (six) hours as needed for moderate pain. 02/12/20   Loletha Grayer, MD    Physical Exam: Vitals:   02/23/20 1311 02/23/20 1312  02/23/20 1313 02/23/20 1330  BP:  (!) 162/55  (!) 154/61  Pulse:  85  85  Resp:  20  (!) 22  Temp:  98.8 F (37.1 C)    TempSrc:  Oral    SpO2: 98% 98%  94%  Weight:   131.5 kg   Height:   5\' 9"  (1.753 m)     Constitutional: NAD, calm, comfortable, pleasant, morbidly obese Eyes: EOMI, lids and conjunctivae normal ENMT: Mucous membranes are moist.  Hearing grossly normal. Respiratory: Decreased breath sounds due to body habitus but clear, no wheezing or rhonchi. Normal respiratory effort. No accessory muscle use.  Cardiovascular: RRR, no murmurs / rubs / gallops.  Profound lower extremity edema with weeping and blistering. Abdomen: soft, left-sided tenderness due to rib cage pain, otherwise nontender without guarding or rebound tenderness, +Bowel sounds.  Musculoskeletal: no clubbing / cyanosis. No joint deformity upper and lower extremities. Normal muscle tone.   Skin: Left lower extremity significant erythema, swelling, weeping, wrapped from the ankle down.  There is erythematous streaking up the left medial thigh  with warmth on palpation and tenderness.  Right lower extremity is wrapped with proximal erythema and edema. (SEE ED PHYSICIAN NOTE FOR IMAGE OF LLE ON ADMISSION)  Neurologic: CN 2-12 grossly intact. Normal speech.  Grossly non-focal exam. Psychiatric: Alert and oriented x 3. Normal mood. Congruent affect.  Normal judgement and insight.    Labs on Admission: I have personally reviewed following labs and imaging studies  CBC: Recent Labs  Lab 02/23/20 1403  WBC 5.8  NEUTROABS 4.9  HGB 7.5*  HCT 23.6*  MCV 91.1  PLT 80*   Basic Metabolic Panel: Recent Labs  Lab 02/23/20 1403  NA 136  K 3.5  CL 100  CO2 28  GLUCOSE 245*  BUN 53*  CREATININE 2.65*  CALCIUM 8.7*   GFR: Estimated Creatinine Clearance: 35.8 mL/min (A) (by C-G formula based on SCr of 2.65 mg/dL (H)). Liver Function Tests: Recent Labs  Lab 02/23/20 1403  AST 30  ALT 24  ALKPHOS 74    BILITOT 1.1  PROT 7.0  ALBUMIN 3.3*   No results for input(s): LIPASE, AMYLASE in the last 168 hours. No results for input(s): AMMONIA in the last 168 hours. Coagulation Profile: No results for input(s): INR, PROTIME in the last 168 hours. Cardiac Enzymes: No results for input(s): CKTOTAL, CKMB, CKMBINDEX, TROPONINI in the last 168 hours. BNP (last 3 results) No results for input(s): PROBNP in the last 8760 hours. HbA1C: No results for input(s): HGBA1C in the last 72 hours. CBG: No results for input(s): GLUCAP in the last 168 hours. Lipid Profile: No results for input(s): CHOL, HDL, LDLCALC, TRIG, CHOLHDL, LDLDIRECT in the last 72 hours. Thyroid Function Tests: No results for input(s): TSH, T4TOTAL, FREET4, T3FREE, THYROIDAB in the last 72 hours. Anemia Panel: No results for input(s): VITAMINB12, FOLATE, FERRITIN, TIBC, IRON, RETICCTPCT in the last 72 hours. Urine analysis:    Component Value Date/Time   COLORURINE STRAW (A) 01/28/2020 1607   APPEARANCEUR CLEAR (A) 01/28/2020 1607   APPEARANCEUR Clear 09/10/2014 0456   LABSPEC 1.006 01/28/2020 1607   LABSPEC 1.016 09/10/2014 0456   PHURINE 5.0 01/28/2020 1607   GLUCOSEU NEGATIVE 01/28/2020 1607   GLUCOSEU Negative 09/10/2014 0456   HGBUR NEGATIVE 01/28/2020 1607   BILIRUBINUR NEGATIVE 01/28/2020 1607   BILIRUBINUR Negative 09/10/2014 0456   KETONESUR NEGATIVE 01/28/2020 1607   PROTEINUR 30 (A) 01/28/2020 1607   NITRITE NEGATIVE 01/28/2020 1607   LEUKOCYTESUR NEGATIVE 01/28/2020 1607   LEUKOCYTESUR Negative 09/10/2014 0456    Radiological Exams on Admission: DG Chest 2 View  Result Date: 02/23/2020 CLINICAL DATA:  Pain following fall EXAM: CHEST - 2 VIEW COMPARISON:  Feb 03, 2020 FINDINGS: Lungs are clear. Heart is mildly enlarged with pulmonary vascularity normal. No adenopathy. No pneumothorax. No evident bone lesions. IMPRESSION: Mild cardiomegaly.  No edema or consolidation. Electronically Signed   By: Lowella Grip III M.D.   On: 02/23/2020 14:56   DG Femur Min 2 Views Left  Result Date: 02/23/2020 CLINICAL DATA:  Pain following fall EXAM: LEFT FEMUR 2 VIEWS COMPARISON:  None. FINDINGS: Frontal and lateral views obtained. No fracture or dislocation. No abnormal periosteal reaction. There is slight narrowing of the left hip joint. No knee joint effusion. There are multiple foci of arterial vascular calcification. IMPRESSION: No fracture or dislocation. Mild narrowing left hip joint. Multiple foci of arterial vascular calcification. Electronically Signed   By: Lowella Grip III M.D.   On: 02/23/2020 14:57    EKG: Independently reviewed.  Poor quality but  appears normal sinus rhythm 88 bpm without acute ischemic changes.  Assessment/Plan Principal Problem:   Cellulitis of left leg Active Problems:   Thrombocytopenia (HCC)   Edema   GAVE (gastric antral vascular ectasia)   Type 2 diabetes mellitus with diabetic nephropathy, with long-term current use of insulin (HCC)   Liver cirrhosis ([place])   CKD stage 4 due to type 2 diabetes mellitus (HCC)   Gastroesophageal reflux disease   Hyperlipidemia   Obesity, Class III, BMI 40-49.9 (morbid obesity) ([place])   Anxiety   Hyperlipidemia associated with type 2 diabetes mellitus ([place])    Cellulitis of left leg -present on admission.  He does not trigger for sepsis but clinically has severe lower extremity cellulitis with warmth and erythema streaking up the medial thigh.  Subjective chills as well.  Treated in the ED with vancomycin and Rocephin. --Rocephin per protocol --Monitor clinically for improvement --Wound care consulted --Tylenol as needed for fever or mild pain  Chronic Edema -Per patient is worse than baseline as he has not been able to wrap his legs sufficiently at home.  Now complicated by cellulitis as well. --Wound care consulted --Lymphedema wraps --Monitor closely  Left-sided rib contusion -x-rays in the ED were negative for  fractures.  Patient is quite tender on the left side.  Minimal ecchymosis.   --Tylenol or hydrocodone as needed --PRN Dilaudid for breakthrough pain --PT and OT eval's  Type 2 diabetes mellitus -controlled, A1c 6.6% on 12/15/2019.  Hypoglycemic on admission with glucose of 245.  Home regimen appears to be Levemir 8 units daily, NovoLog 4 units 3 times daily with meals.   --Continue Levemir 8 units --Resistant sliding scale NovoLog --Adjust as indicated for glycemic control  Liver cirrhosis - stable, no acute issues. --Monitor closely for signs of hepatic encephalopathy --Continue rifaximin and lactulose --Continue torsemide and spironolactone --Monitor liver function and coags  Thrombocytopenia - chronic, secondary to liver cirrhosis.  Slightly lower than at discharge, likely due to infection. --SCDs for DVT prophylaxis --Monitor CBC  CKD stage 4 -presented with creatinine 2.65 (slightly improved from 5/14). --Avoid nephrotoxic agents and hypotension --Renally dose meds as indicated --Monitor renal function and electrolytes   GERD -continue home PPI  GAVE (gastric antral vascular ectasia) -with history of GI bleeding likely secondary to this.  Appears stable at this time. --Monitor CBC  Iron Deficiency Anemia - due to GAVE most likely.  Hemoglobin 7.5 on admission, stable from prior. --monitor for bleeding --monitor CBC --transfuse if Hbg < 7.0 --continue supplement  Hyperlipidemia -continue statin  Anxiety -continue home Ativan  Morbid Obesity, Class III, BMI 40-49.9 - patient BMI is 42.83.  This complicates overall care and prognosis.    DVT prophylaxis: SCD's  Code Status: Full  Family Communication: none at bedside during encounter, will attempt to call  Disposition Plan: expect he will need placement  Consults called: none  Admission status: inpatient    Status is: Inpatient  Patient is inpatient appropriate because of severe lower extremity cellulitis  requiring at least 48 hours IV antibiotics.   Dispo: The patient is from: Home              Anticipated d/c is to: SNF              Anticipated d/c date is: 2 days              Patient currently is not medically stable to d/c.    Ezekiel Slocumb, DO [facility]  02/23/2020,  4:23 PM    If 7PM-7AM, please contact night-coverage. How to contact the [facility] Attending or Consulting provider [place] or covering provider during after hours [place], for this patient?    1. Check the care team in [facility] and look for a) attending/consulting [facility] provider listed and b) the [facility] team listed 2. Log into [email or web address] and use [place]'s universal password to access. If you do not have the password, please contact the hospital operator. 3. Locate the [facility] provider you are looking for under [facility] and page to a number that you can be directly reached. 4. If you still have difficulty reaching the provider, please page the [facility] (Director on Call) for the Hospitalists listed on amion for assistance.

## 2020-02-23 NOTE — ED Notes (Signed)
Charge Raquel notified and states if floor has not called back in 15 min pt is to go up and floor to call with questions.

## 2020-02-23 NOTE — ED Notes (Signed)
Patient given dinner tray. Sitting in bed eating at this time. No further needs expressed.

## 2020-02-23 NOTE — ED Notes (Signed)
Attempted to call report at this time per floor not taking report because they have 5 pts assigned to there floor at this time.

## 2020-02-23 NOTE — ED Notes (Signed)
Pt being transported to floor at this time. Have not heard back from floor RN

## 2020-02-23 NOTE — ED Triage Notes (Signed)
Pt to ED via [facility] from home. Per EMS pt sustained an unwitness fall yesterday with bruising to the left side and rib cage. Pt caregiver found him in the bathroom. Pt also with bilateral leg swelling and weeping. Redness on left lower leg radiating to upper left leg. Pt stating still has swelling in scrotum. Pt c/o SOB as well.

## 2020-02-23 NOTE — TOC Initial Note (Signed)
Transition of Care [facility]) - Initial/Assessment Note    Patient Details  Name: Corey West MRN: [id number] Date of Birth: 1950-12-23  Transition of Care [facility]) CM/SW Contact:    Ova Freshwater Phone Number: [phone] 02/23/2020, 1:45 PM  Clinical Narrative:                  Patient is active w/ [name] [phone]), PT/OT and RN.       Patient Goals and CMS Choice        Expected Discharge Plan and Services                                                Prior Living Arrangements/Services                       Activities of Daily Living      Permission Sought/Granted                  Emotional Assessment              Admission diagnosis:  fall Patient Active Problem List   Diagnosis Date Noted  . Acute hypoxemic respiratory failure ([place]) 02/04/2020  . Volume overload 01/28/2020  . Hyperlipidemia associated with type 2 diabetes mellitus ([place]) 01/28/2020  . Liver cirrhosis ([place])   . Anxiety   . SOB (shortness of breath)   . Elevated troponin   . CKD stage 4 due to type 2 diabetes mellitus ([place])   . Unstageable pressure ulcer of sacral region ([place]) 11/28/2019  . Acute on chronic diastolic CHF (congestive heart failure) ([place]) 11/27/2019  . Type II diabetes mellitus with renal manifestations ([place]) 11/27/2019  . Cellulitis of left lower extremity 11/27/2019  . Cellulitis of lower extremity 11/27/2019  . HCAP (healthcare-associated pneumonia) 02/03/2019  . Aphasia 01/14/2019  . Acute on chronic renal failure ([place]) 01/14/2019  . Malnutrition of moderate degree 12/03/2018  . C. difficile diarrhea 12/02/2018  . Ankle fracture, left, closed, initial encounter 10/15/2018  . Ankle fracture, right, closed, initial encounter 10/15/2018  . Ankle fracture 10/15/2018  . Pressure injury of skin 07/19/2018  . UTI (urinary tract infection) 07/18/2018  . Acute kidney injury superimposed on CKD ([place]) 01/02/2018  .  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 Goals of care, counseling/discussion   . Palliative care encounter   . Acute hepatic encephalopathy 12/16/2017  . Hepatic encephalopathy ([place]) 10/18/2017  . Hypokalemia 10/09/2017  . Hyperglycemia 10/09/2017  . Acute on chronic anemia 06/26/2017  . Sepsis ([place]) 06/12/2017  . Type 2 diabetes mellitus with diabetic nephropathy, with long-term current use of insulin ([place]) 05/29/2017  . Cellulitis in diabetic foot ([place]) 02/21/2017  . Cellulitis 02/21/2017  . GI bleed 07/20/2015  . Hematemesis 02/08/2015  . Cirrhosis ([place]) 02/08/2015  . Esophageal varices ([place]) 02/08/2015  . Gastroesophageal reflux disease 02/08/2015  . Hyperlipidemia 02/08/2015  . Chronic venous stasis dermatitis of both lower extremities 10/06/2014  . Edema 05/25/2014  . Hypertension associated with diabetes ([place]) 05/25/2014  . Obesity, Class III, BMI 40-49.9 (morbid obesity) ([place]) 05/25/2014  . Sleep apnea 05/25/2014  . Barrett's esophagus 05/25/2014  . Pancytopenia ([place]) 05/25/2014  . Iron deficiency anemia 12/04/2013  . Thrombocytopenia ([place]) 12/04/2013  . Splenomegaly 11/04/2013   PCP:  System, Pcp Not In Pharmacy:  [facility] - [place], [place] [place] [address] Phone: [phone] Fax: [phone]     Social Determinants of Health (SDOH) Interventions    Readmission Risk Interventions Readmission Risk Prevention Plan 02/02/2020 12/15/2019 02/03/2019  Transportation Screening Complete Complete Complete  PCP or Specialist Appt within 3-5 Days Complete - -  HRI or Home Care Consult Complete - -  Social Work Consult for [place] Planning/Counseling Complete - -  Palliative Care Screening Not Applicable - -  Medication Review Press photographer) Complete Complete Complete  PCP or Specialist appointment within 3-5 days of discharge - Complete -  [place] or [place] - Complete Complete  SW Recovery  Care/Counseling Consult - - Not Complete  SW Consult Not Complete Comments - - NA  Palliative Care Screening - Not Applicable Not Applicable  Skilled Nursing Facility - Not Applicable Not Applicable  Some recent data might be hidden

## 2020-02-23 NOTE — Progress Notes (Signed)
PHARMACY -  BRIEF ANTIBIOTIC NOTE   Pharmacy has received consult(s) for vancomycin from an ED provider.  The patient's profile has been reviewed for ht/wt/allergies/indication/available labs.    One time order(s) placed for vancomycin 2g IV x1  Further antibiotics/pharmacy consults should be ordered by admitting physician if indicated.                       Thank you, Rocky Morel 02/23/2020  3:10 PM

## 2020-02-24 ENCOUNTER — Other Ambulatory Visit: Payer: Self-pay

## 2020-02-24 DIAGNOSIS — N184 Chronic kidney disease, stage 4 (severe): Secondary | ICD-10-CM

## 2020-02-24 DIAGNOSIS — D696 Thrombocytopenia, unspecified: Secondary | ICD-10-CM

## 2020-02-24 DIAGNOSIS — L03116 Cellulitis of left lower limb: Principal | ICD-10-CM

## 2020-02-24 DIAGNOSIS — E1122 Type 2 diabetes mellitus with diabetic chronic kidney disease: Secondary | ICD-10-CM

## 2020-02-24 DIAGNOSIS — K7469 Other cirrhosis of liver: Secondary | ICD-10-CM

## 2020-02-24 LAB — GLUCOSE, CAPILLARY
Glucose-Capillary: 194 mg/dL — ABNORMAL HIGH (ref 70–99)
Glucose-Capillary: 160 mg/dL — ABNORMAL HIGH (ref 70–99)
Glucose-Capillary: 221 mg/dL — ABNORMAL HIGH (ref 70–99)
Glucose-Capillary: 229 mg/dL — ABNORMAL HIGH (ref 70–99)
Glucose-Capillary: 186 mg/dL — ABNORMAL HIGH (ref 70–99)

## 2020-02-24 LAB — COMPREHENSIVE METABOLIC PANEL
ALT: 21 U/L (ref 0–44)
AST: 22 U/L (ref 15–41)
Albumin: 3.1 g/dL — ABNORMAL LOW (ref 3.5–5.0)
Alkaline Phosphatase: 70 U/L (ref 38–126)
Anion gap: 9 (ref 5–15)
BUN: 52 mg/dL — ABNORMAL HIGH (ref 8–23)
CO2: 28 mmol/L (ref 22–32)
Calcium: 8.7 mg/dL — ABNORMAL LOW (ref 8.9–10.3)
Chloride: 100 mmol/L (ref 98–111)
Creatinine, Ser: 2.98 mg/dL — ABNORMAL HIGH (ref 0.61–1.24)
GFR calc Af Amer: 24 mL/min — ABNORMAL LOW (ref >60)
GFR calc non Af Amer: 21 mL/min — ABNORMAL LOW (ref >60)
Glucose, Bld: 210 mg/dL — ABNORMAL HIGH (ref 70–99)
Potassium: 3.6 mmol/L (ref 3.5–5.1)
Sodium: 137 mmol/L (ref 135–145)
Total Bilirubin: 1 mg/dL (ref 0.3–1.2)
Total Protein: 6.6 g/dL (ref 6.5–8.1)

## 2020-02-24 LAB — CBC
HCT: 22.6 % — ABNORMAL LOW (ref 39.0–52.0)
Hemoglobin: 7.2 g/dL — ABNORMAL LOW (ref 13.0–17.0)
MCH: 29.6 pg (ref 26.0–34.0)
MCHC: 31.9 g/dL (ref 30.0–36.0)
MCV: 93 fL (ref 80.0–100.0)
Platelets: 75 10*3/uL — ABNORMAL LOW (ref 150–400)
RBC: 2.43 MIL/uL — ABNORMAL LOW (ref 4.22–5.81)
RDW: 14.8 % (ref 11.5–15.5)
WBC: 3.8 10*3/uL — ABNORMAL LOW (ref 4.0–10.5)
nRBC: 0 % (ref 0.0–0.2)

## 2020-02-24 LAB — HEMOGLOBIN A1C
Hgb A1c MFr Bld: 6.9 % — ABNORMAL HIGH (ref 4.8–5.6)
Mean Plasma Glucose: 151.33 mg/dL

## 2020-02-24 LAB — MAGNESIUM: Magnesium: 1.6 mg/dL — ABNORMAL LOW (ref 1.7–2.4)

## 2020-02-24 LAB — PROTIME-INR
INR: 1.5 — ABNORMAL HIGH (ref 0.8–1.2)
Prothrombin Time: 17.6 seconds — ABNORMAL HIGH (ref 11.4–15.2)

## 2020-02-24 MED ORDER — IPRATROPIUM-ALBUTEROL 0.5-2.5 (3) MG/3ML IN SOLN: 3.0000 mL | Freq: Four times a day (QID) | RESPIRATORY_TRACT | Status: DC | PRN

## 2020-02-24 MED ORDER — MAGNESIUM SULFATE 2 GM/50ML IV SOLN
2.0000 g | Freq: Once | INTRAVENOUS | Status: AC
  Administered 2020-02-24: 2 g via INTRAVENOUS
  Filled 2020-02-24: qty 50

## 2020-02-24 MED ORDER — ENSURE MAX PROTEIN PO LIQD
11.0000 [oz_av] | Freq: Two times a day (BID) | ORAL | Status: DC
  Administered 2020-02-25: 11 [oz_av] via ORAL
  Filled 2020-02-24: qty 330

## 2020-02-24 MED ORDER — IPRATROPIUM-ALBUTEROL 0.5-2.5 (3) MG/3ML IN SOLN
3.0000 mL | Freq: Two times a day (BID) | RESPIRATORY_TRACT | Status: DC
  Administered 2020-02-25: 3 mL via RESPIRATORY_TRACT
  Filled 2020-02-24: qty 3

## 2020-02-24 NOTE — Progress Notes (Signed)
Initial Nutrition Assessment  DOCUMENTATION CODES:   Morbid obesity  INTERVENTION:  Provide Ensure Max Protein po BID, each supplement provides 150 kcal and 30 grams of protein.  NUTRITION DIAGNOSIS:   Increased nutrient needs related to catabolic illness(cirrhosis, CKD) as evidenced by estimated needs.  GOAL:   Patient will meet greater than or equal to 90% of their needs  MONITOR:   PO intake, Supplement acceptance, Labs, Weight trends, I & O's, Skin  REASON FOR ASSESSMENT:   Malnutrition Screening Tool    ASSESSMENT:   69 year old male with PMHx of GERD, hx hiatal hernia, liver cirrhosis, esophageal varices, arthritis, Barrett's esophagus, DM, HTN, HLD, depression, anxiety, venous stasis of both lower extremities with cellulitis, sleep apnea, GAVE (gastric antral vascular ectasia), CKD stage IV admitted with cellulitis of left leg.   Met with patient at bedside. He reports his appetite and intake have been decreased for the past 3 weeks. He reports he has been on a restrictive diet as he was in the hospital for 2 weeks and then at rehab for 1 week. He is eating well here and ate 100% of his breakfast this morning. He reports he could have finished his lunch but he stopped eating as he kept getting visits from providers while he was eating. Patient does not like Ensure Enlive. He is willing to try Ensure Max Protein.  Patient reports he has lost 30 lbs recently. He does endorse that he has been diuresed so a lot of that weight loss could have been from fluid. It appears patient was 129-136 kg on 2020. He is now 132.7 kg (292.55 lbs). Difficult to trend weight loss without knowing dry weight.  Medications reviewed and include: Novolog 0-20 units TID, Novolog 0-5 units QHS, Levemir 8 units daily, lactulose 30 grams BID, MVI daily, Protonix, spironolactone, torsemide, ceftriaxone, magnesium sulfate 2 grams IV once today.  Labs reviewed: CBG 160-221, BUN 52, Creatinine 2.98,  Magnesium 1.6.  Patient is at risk for malnutrition.  NUTRITION - FOCUSED PHYSICAL EXAM:    Most Recent Value  Orbital Region  No depletion  Upper Arm Region  Moderate depletion  Thoracic and Lumbar Region  No depletion  Buccal Region  No depletion  [place] Region  No depletion  Clavicle Bone Region  No depletion  Clavicle and Acromion Bone Region  No depletion  Scapular Bone Region  Unable to assess  Dorsal Hand  Mild depletion  Patellar Region  Unable to assess  Anterior Thigh Region  Unable to assess  Posterior Calf Region  Unable to assess  Edema (RD Assessment)  Moderate  Hair  Reviewed  Eyes  Reviewed  Mouth  Reviewed  Skin  Reviewed  Nails  Reviewed     Diet Order:   Diet Order            Diet heart healthy/carb modified Room service appropriate? Yes; Fluid consistency: Thin  Diet effective now             EDUCATION NEEDS:   No education needs have been identified at this time  Skin:  Skin Assessment: Skin Integrity Issues:(cellulitis bilateral legs; MSAD to groin)  Last BM:  02/22/2020 per chart  Height:   Ht Readings from Last 1 Encounters:  02/23/20 '5\' 9"'$  (1.753 m)   Weight:   Wt Readings from Last 1 Encounters:  02/23/20 132.7 kg   BMI:  Body mass index is 43.2 kg/m.  Estimated Nutritional Needs:   Kcal:  9470-9628  Protein:  135 grams  Fluid:  1.8 L/day  Jacklynn Barnacle, MS, RD, LDN Pager number available on Amion

## 2020-02-24 NOTE — NC FL2 (Signed)
[place] LEVEL OF CARE SCREENING TOOL     IDENTIFICATION  Patient Name: Corey West Birthdate: 09-Oct-1950 Sex: male Admission Date (Current Location): 02/23/2020  [place] and [place] Number:  Engineering geologist and Address:  [facility], [address], [place], [place] [address]      Provider Number: [id number]  Attending Physician Name and Address:  Jennye Boroughs, MD  Relative Name and Phone Number:  Niece [name] [phone]    Current Level of Care: Hospital Recommended Level of Care: [place] Prior Approval Number:    Date Approved/Denied:   PASRR Number: [id number]  Discharge Plan: SNF    Current Diagnoses: Patient Active Problem List   Diagnosis Date Noted  . Cellulitis of left leg 02/23/2020  . Contusion of ribs, left, initial encounter 02/23/2020  . Acute hypoxemic respiratory failure ([place]) 02/04/2020  . Volume overload 01/28/2020  . Hyperlipidemia associated with type 2 diabetes mellitus ([place]) 01/28/2020  . Liver cirrhosis ([place])   . Anxiety   . SOB (shortness of breath)   . Elevated troponin   . CKD stage 4 due to type 2 diabetes mellitus ([place])   . Unstageable pressure ulcer of sacral region ([place]) 11/28/2019  . Acute on chronic diastolic CHF (congestive heart failure) ([place]) 11/27/2019  . Type II diabetes mellitus with renal manifestations ([place]) 11/27/2019  . Cellulitis of left lower extremity 11/27/2019  . Cellulitis of lower extremity 11/27/2019  . HCAP (healthcare-associated pneumonia) 02/03/2019  . Aphasia 01/14/2019  . Acute on chronic renal failure ([place]) 01/14/2019  . Malnutrition of moderate degree 12/03/2018  . C. difficile diarrhea 12/02/2018  . Ankle fracture, left, closed, initial encounter 10/15/2018  . Ankle fracture, right, closed, initial encounter 10/15/2018  . Ankle fracture 10/15/2018  . Pressure injury of skin 07/19/2018  . UTI (urinary tract infection) 07/18/2018  . Acute  kidney injury superimposed on CKD ([place]) 01/02/2018  . 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 Goals of care, counseling/discussion   . Palliative care encounter   . Acute hepatic encephalopathy 12/16/2017  . Hepatic encephalopathy ([place]) 10/18/2017  . Hypokalemia 10/09/2017  . Hyperglycemia 10/09/2017  . Acute on chronic anemia 06/26/2017  . Sepsis ([place]) 06/12/2017  . Type 2 diabetes mellitus with diabetic nephropathy, with long-term current use of insulin ([place]) 05/29/2017  . Cellulitis in diabetic foot ([place]) 02/21/2017  . Cellulitis 02/21/2017  . GI bleed 07/20/2015  . Hematemesis 02/08/2015  . Cirrhosis ([place]) 02/08/2015  . Esophageal varices ([place]) 02/08/2015  . Gastroesophageal reflux disease 02/08/2015  . Hyperlipidemia 02/08/2015  . Chronic venous stasis dermatitis of both lower extremities 10/06/2014  . Edema 05/25/2014  . Hypertension associated with diabetes ([place]) 05/25/2014  . Obesity, Class III, BMI 40-49.9 (morbid obesity) ([place]) 05/25/2014  . Sleep apnea 05/25/2014  . Barrett's esophagus 05/25/2014  . Pancytopenia ([place]) 05/25/2014  . Iron deficiency anemia 12/04/2013  . Thrombocytopenia ([place]) 12/04/2013  . Splenomegaly 11/04/2013    Orientation RESPIRATION BLADDER Height & Weight     Self, Time, Situation, Place  Normal Incontinent, External catheter Weight: 132.7 kg Height:  5\' 9"  (175.3 cm)  BEHAVIORAL SYMPTOMS/MOOD NEUROLOGICAL BOWEL NUTRITION STATUS      Incontinent Diet(carb modified)  AMBULATORY STATUS COMMUNICATION OF NEEDS Skin   Extensive Assist Verbally Other (Comment)(cellulitis, edema bilateral lower extremeity)                       Personal Care Assistance Level of Assistance  Bathing,  Feeding, Dressing Bathing Assistance: Limited assistance Feeding assistance: Independent Dressing Assistance: Limited assistance     Functional Limitations Info  Sight, Hearing, Speech Sight Info: Adequate Hearing Info: Adequate Speech Info:  Adequate    SPECIAL CARE FACTORS FREQUENCY  PT (By licensed PT), OT (By licensed OT)     PT Frequency: 5 times per week OT Frequency: 5 times per week            Contractures Contractures Info: Not present    Additional Factors Info  Code Status, Allergies Code Status Info: Full code Allergies Info: NKDA           Current Medications (02/24/2020):  This is the current hospital active medication list Current Facility-Administered Medications  Medication Dose Route Frequency Provider Last Rate Last Admin  . acetaminophen (TYLENOL) tablet 650 mg  650 mg Oral Q6H PRN Nicole Kindred A, DO       Or  . acetaminophen (TYLENOL) suppository 650 mg  650 mg Rectal Q6H PRN Nicole Kindred A, DO      . amLODipine (NORVASC) tablet 5 mg  5 mg Oral Daily Nicole Kindred A, DO      . atorvastatin (LIPITOR) tablet 20 mg  20 mg Oral q1800 Nicole Kindred A, DO      . cefTRIAXone (ROCEPHIN) 2 g in sodium chloride 0.9 % 100 mL IVPB  2 g Intravenous Q24H Nicole Kindred A, DO      . feeding supplement (ENSURE ENLIVE) (ENSURE ENLIVE) liquid 237 mL  237 mL Oral BID BM Nicole Kindred A, DO      . hydrALAZINE (APRESOLINE) tablet 25 mg  25 mg Oral TID Nicole Kindred A, DO   25 mg at 02/23/20 2158  . HYDROcodone-acetaminophen (NORCO/VICODIN) 5-325 MG per tablet 1-2 tablet  1-2 tablet Oral Q4H PRN Nicole Kindred A, DO   2 tablet at 02/24/20 3710  . HYDROmorphone (DILAUDID) injection 0.5 mg  0.5 mg Intravenous Q4H PRN Nicole Kindred A, DO      . insulin aspart (novoLOG) injection 0-20 Units  0-20 Units Subcutaneous TID WC Nicole Kindred A, DO   4 Units at 02/24/20 6269  . insulin aspart (novoLOG) injection 0-5 Units  0-5 Units Subcutaneous QHS Nicole Kindred A, DO      . insulin detemir (LEVEMIR) injection 8 Units  8 Units Subcutaneous Daily Nicole Kindred A, DO      . ipratropium-albuterol (DUONEB) 0.5-2.5 (3) MG/3ML nebulizer solution 3 mL  3 mL Nebulization Q6H Griffith, Kelly A, DO   3 mL at  02/24/20 0755  . iron polysaccharides (NIFEREX) capsule 150 mg  150 mg Oral Daily Nicole Kindred A, DO      . lactulose (CHRONULAC) 10 GM/15ML solution 30 g  30 g Oral BID Nicole Kindred A, DO   30 g at 02/23/20 2158  . LORazepam (ATIVAN) tablet 0.5 mg  0.5 mg Oral Q8H PRN Nicole Kindred A, DO   0.5 mg at 02/23/20 2204  . multivitamin with minerals tablet 1 tablet  1 tablet Oral Daily Nicole Kindred A, DO      . ondansetron (ZOFRAN) tablet 4 mg  4 mg Oral Q6H PRN Nicole Kindred A, DO       Or  . ondansetron (ZOFRAN) injection 4 mg  4 mg Intravenous Q6H PRN Nicole Kindred A, DO      . pantoprazole (PROTONIX) EC tablet 40 mg  40 mg Oral BID Nicole Kindred A, DO   40 mg at 02/23/20 2158  .  rifaximin (XIFAXAN) tablet 550 mg  550 mg Oral BID Nicole Kindred A, DO   550 mg at 02/23/20 2157  . sodium chloride (OCEAN) 0.65 % nasal spray 1 spray  1 spray Each Nare PRN Nicole Kindred A, DO      . spironolactone (ALDACTONE) tablet 25 mg  25 mg Oral Daily Nicole Kindred A, DO      . torsemide (DEMADEX) tablet 40 mg  40 mg Oral Daily Nicole Kindred A, DO      . traMADol (ULTRAM) tablet 50 mg  50 mg Oral Q6H PRN Nicole Kindred A, DO   50 mg at 02/24/20 0021  . traZODone (DESYREL) tablet 50 mg  50 mg Oral QHS PRN Ezekiel Slocumb, DO         Discharge Medications: Please see discharge summary for a list of discharge medications.  Relevant Imaging Results:  Relevant Lab Results:   Additional Information SS#: [id number]  Su Hilt, RN

## 2020-02-24 NOTE — Evaluation (Signed)
Occupational Therapy Evaluation Patient Details Name: Corey West MRN: [id number] DOB: 1951/04/16 Today's Date: 02/24/2020    History of Present Illness Per MD notes: is a 69 y.o. male with medical history significant of morbid obesity, chronic lymphedema and venous stasis, recurrent UTIs.  Type 2 diabetes, CKD-4, liver cirrhosis, GAVE with hx of GI bleeds, chronic iron deficiency anemia, GERD, likely OSA, chronic thrombocytopenia, HTN, HLD present to the ED today with multiple complaints including increased weeping and swelling of his legs, left-sided rib and chest wall pain after a fall.  MD assessment includes: Cellulitis of left leg, chronic edema, and left-sided rib contusion with imaging negative for Fx.   Clinical Impression   Pt was seen for OT evaluation this date. Prior to hospital admission, pt reports being indep with self care ADLs and some IADLs including driving. Pt lives alone and has [facility] nurse that comes and dresses his wounds on W/F. Currently pt demonstrates impairments as described below (See OT problem list) which functionally limit his ability to perform ADL/self-care tasks. Pt currently requires MIN A with bari RW for ADL transfers and MOD/MAX A with LB ADLs.  Pt would benefit from skilled OT to address noted impairments and functional limitations (see below for any additional details) in order to maximize safety and independence while minimizing falls risk and caregiver burden. Upon hospital discharge, recommend STR to maximize pt safety and return to PLOF.     Follow Up Recommendations  SNF;Supervision - Intermittent    Equipment Recommendations  Other (comment)(TBD)    Recommendations for Other Services       Precautions / Restrictions Precautions Precautions: Fall Restrictions Weight Bearing Restrictions: No      Mobility Bed Mobility Overal bed mobility: Needs Assistance Bed Mobility: Supine to Sit;Sit to Supine     Supine to sit: Min assist;HOB  elevated Sit to supine: Mod assist;HOB elevated   General bed mobility comments: MOD A to manage LEs d/t rib pain with sit to sup  Transfers Overall transfer level: Needs assistance Equipment used: Rolling walker (2 wheeled) Transfers: Sit to/from Stand Sit to Stand: From elevated surface;Min assist         General transfer comment: Bari walker, one cue for safe hand placement, poor eccentric control on stand to sit    Balance Overall balance assessment: Needs assistance Sitting-balance support: No upper extremity supported Sitting balance-Leahy Scale: Good     Standing balance support: Bilateral upper extremity supported Standing balance-Leahy Scale: Fair Standing balance comment: B UE support to sustain static standing                           ADL either performed or assessed with clinical judgement   ADL Overall ADL's : Needs assistance/impaired Eating/Feeding: Independent;Sitting   Grooming: Wash/dry hands;Wash/dry face;Independent;Sitting   Upper Body Bathing: Set up;Sitting   Lower Body Bathing: Moderate assistance;Sit to/from stand   Upper Body Dressing : Set up;Sitting   Lower Body Dressing: Maximal assistance Lower Body Dressing Details (indicate cue type and reason): able to simulate clothing mgt over hips with MIN A for standing balance, MAX A for threading socks in sitting Toilet Transfer: Minimal assistance;RW;BSC;Stand-pivot   Toileting- Clothing Manipulation and Hygiene: Moderate assistance;Sit to/from stand Toileting - Clothing Manipulation Details (indicate cue type and reason): has rectal tube and esternal catheter currently             Vision Baseline Vision/History: Wears glasses Wears Glasses: At all times Patient  Visual Report: No change from baseline       Perception     Praxis      Pertinent Vitals/Pain Pain Assessment: 0-10 Pain Score: 5  Pain Location: L ribs Pain Descriptors / Indicators:  Grimacing;Aching;Sore Pain Intervention(s): Limited activity within patient's tolerance;Monitored during session     Hand Dominance Right   Extremity/Trunk Assessment Upper Extremity Assessment Upper Extremity Assessment: Generalized weakness   Lower Extremity Assessment Lower Extremity Assessment: Generalized weakness       Communication Communication Communication: No difficulties   Cognition Arousal/Alertness: Awake/alert Behavior During Therapy: WFL for tasks assessed/performed Overall Cognitive Status: Within Functional Limits for tasks assessed                                 General Comments: somewhat lacks inisght into deficits/health status. Some impulsivity/innappropriate comments. Able to follow commands. Oriented to place, situation and most aspects of time (off 2 days on date)   General Comments       Exercises Other Exercises Other Exercises: OT faciliates education re: role of OT in acute setting. Pt somewhat familiar d/t recent admission and subsequent rehab stay. Other Exercises: OT Facilitates edcuation re: PLB to improve sats Other Exercises: Lateral scooting at EOB   Shoulder Instructions      Home Living Family/patient expects to be discharged to:: Private residence Living Arrangements: Alone Available Help at Discharge: Friend(s);Available PRN/intermittently Type of Home: Other(Comment)(condo) Home Access: Stairs to enter Entrance Stairs-Number of Steps: 4 Entrance Stairs-Rails: Right Home Layout: One level     Bathroom Shower/Tub: Teacher, early years/pre: Handicapped height     Home Equipment: Environmental consultant - 2 wheels;Cane - single point   Additional Comments: bari walker      Prior Functioning/Environment Level of Independence: Independent with assistive device(s);Needs assistance  Gait / Transfers Assistance Needed: MOD I for fxl mobility with HH amb with RW, w/c for MD appts ADL's / Homemaking Assistance Needed:  Reports that he is Indep with BADLs and does not use shower, "bird baths". Does have [facility] nurse come in to dress LE wounds. Reports being Indep with meal prep and med mgt. Drives himself to appts   Comments: Mod independent with HH ambulation with a RW, w/c for MD apts, Ind with ADLs and tasks. Pt. reports taking sinkside baths. Independent with meal preparation, medication management, and driving.        OT Problem List: Decreased strength;Decreased activity tolerance;Decreased knowledge of use of DME or AE      OT Treatment/Interventions:      OT Goals(Current goals can be found in the care plan section) Acute Rehab OT Goals Patient Stated Goal: To return home OT Goal Formulation: With patient Time For Goal Achievement: 03/09/20 Potential to Achieve Goals: Good  OT Frequency: Min 2X/week   Barriers to D/C:            Co-evaluation              AM-PAC OT "6 Clicks" Daily Activity     Outcome Measure Help from another person eating meals?: None Help from another person taking care of personal grooming?: None Help from another person toileting, which includes using toliet, bedpan, or urinal?: A Lot Help from another person bathing (including washing, rinsing, drying)?: A Lot Help from another person to put on and taking off regular upper body clothing?: A Little Help from another person to put on and taking  off regular lower body clothing?: A Lot 6 Click Score: 17   End of Session Equipment Utilized During Treatment: Gait belt;Rolling walker Nurse Communication: Mobility status  Activity Tolerance: Patient tolerated treatment well Patient left: with call bell/phone within reach;in bed;with bed alarm set  OT Visit Diagnosis: Muscle weakness (generalized) (M62.81);Unsteadiness on feet (R26.81)                Time: 4128-2081 OT Time Calculation (min): 38 min Charges:  OT General Charges $OT Visit: 1 Visit OT Evaluation $OT Eval Moderate Complexity: 1 Mod OT  Treatments $Self Care/Home Management : 8-22 mins $Therapeutic Activity: 8-22 mins  [name], MS, OTR/L ascom [phone] 02/24/20, 4:02 PM

## 2020-02-24 NOTE — TOC Progression Note (Signed)
Transition of Care [facility]) - Progression Note    Patient Details  Name: Corey West MRN: [id number] Date of Birth: 1951/04/18  Transition of Care [facility]) CM/SW [place], RN Phone Number: 02/24/2020, 3:06 PM  Clinical Narrative:     Spoke with the Patient about DC plan and needs, He lives at home alone and is not able to care for himself he says due to broken ribs, he takes medication that makes him have to go to the bathroom very quickly and doe snot make it to the bathroom, he stated that he knows he needs around the clock care, I explained that insurance does not cover that kind of care at home, I encouraged him to consider rehab, We reviewed the bed offers that are available, he stated that he would consider [facility] he will let me know for sure tomorrow morning, I notified Tammy at [facility] of the possibility        Expected Discharge Plan and Services                                                 Social Determinants of Health (SDOH) Interventions    Readmission Risk Interventions Readmission Risk Prevention Plan 02/02/2020 12/15/2019 02/03/2019  Transportation Screening Complete Complete Complete  PCP or Specialist Appt within 3-5 Days Complete - -  HRI or Home Care Consult Complete - -  Social Work Consult for [place] Planning/Counseling Complete - -  Palliative Care Screening Not Applicable - -  Medication Review Press photographer) Complete Complete Complete  PCP or Specialist appointment within 3-5 days of discharge - Complete -  HRI or Home Care Consult - Complete Complete  SW Recovery Care/Counseling Consult - - Not Complete  SW Consult Not Complete Comments - - NA  Palliative Care Screening - Not Applicable Not Applicable  Skilled Nursing Facility - Not Applicable Not Applicable  Some recent data might be hidden

## 2020-02-24 NOTE — Progress Notes (Addendum)
Progress Note    Corey West  YTK:[id number] DOB: 04-Oct-1950  DOA: 02/23/2020 PCP: System, Pcp Not In      Brief Narrative:    Medical records reviewed and are as summarized below:  Corey West is a 69 y.o. male male with medical history significant of morbid obesity, chronic lymphedema and venous stasis, recurrent UTIs.  Type 2 diabetes, CKD-4, liver cirrhosis, GAVE with hx of GI bleeds, chronic iron deficiency anemia, GERD, likely OSA, chronic thrombocytopenia, HTN, HLD present to the ED today with multiple complaints including increased weeping and swelling of his legs, left-sided rib and chest wall pain after a fall 2 or 3 days ago.  Due to the pain, patient has been unable to take care for himself.  He takes lactulose for his cirrhosis, states he often does not make it to the bathroom in time and has been in too much pain to clean himself up adequately.      Assessment/Plan:   Principal Problem:   Cellulitis of left leg Active Problems:   Gastroesophageal reflux disease   Hyperlipidemia   Iron deficiency anemia   Thrombocytopenia (HCC)   Edema   GAVE (gastric antral vascular ectasia)   Obesity, Class III, BMI 40-49.9 (morbid obesity) ([place])   Type 2 diabetes mellitus with diabetic nephropathy, with long-term current use of insulin (HCC)   Liver cirrhosis (HCC)   Anxiety   CKD stage 4 due to type 2 diabetes mellitus ([place])   Hyperlipidemia associated with type 2 diabetes mellitus (HCC)   Contusion of ribs, left, initial encounter   Cellulitis of left leg -continue IV Rocephin.  Analgesics as needed for pain.  Chronic Edema -Per patient is worse than baseline as he has not been able to wrap his legs sufficiently at home.  Now complicated by cellulitis as well. --Wound care consulted --Lymphedema wraps --Monitor closely  Left-sided rib contusion -x-rays in the ED were negative for fractures.    Analgesics as needed for pain.  Continue PT and OT.  Type 2  diabetes mellitus -controlled, A1c 6.6% on 12/15/2019.  Continue Levemir and NovoLog.  For hyperglycemia.  Liver cirrhosis with coagulopathy - compensated --Continue rifaximin and lactulose --Continue torsemide and spironolactone  Thrombocytopenia - chronic, secondary to liver cirrhosis.  --Monitor CBC  CKD stage 4 -presented with creatinine 2.65 (slightly improved from 5/14). --Avoid nephrotoxic agents and hypotension --Renally dose meds as indicated --Monitor renal function and electrolytes   GERD -continue home PPI  GAVE (gastric antral vascular ectasia) -with history of GI bleeding likely secondary to this.  Appears stable at this time. --Monitor CBC  Iron Deficiency Anemia - due to GAVE most likely.  Hemoglobin 7.5 on admission, stable from prior. --monitor for bleeding --monitor CBC --transfuse if Hbg < 7.0 --continue supplement  Hyperlipidemia -continue statin  Anxiety -continue home Ativan    Body mass index is 43.2 kg/m.  (Morbid obesity)-this complicates overall care and prognosis.      Family Communication/Anticipated D/C date and plan/Code Status   DVT prophylaxis: SCDs because of thrombocytopenia and coagulopathy Code Status: Full code Family Communication: Plan discussed with patient Disposition Plan:    Status is: Inpatient  Remains inpatient appropriate because:Unsafe d/c plan, IV treatments appropriate due to intensity of illness or inability to take PO and Inpatient level of care appropriate due to severity of illness   Dispo: The patient is from: Home              Anticipated d/c  is to: SNF              Anticipated d/c date is: 3 days              Patient currently is not medically stable to d/c.            Subjective:   He complains of left-sided chest wall pain.  He also has pain in the left leg  Objective:    Vitals:   02/24/20 0810 02/24/20 1204 02/24/20 1506 02/24/20 1636  BP: (!) 136/58 (!) 146/62  (!) 148/62   Pulse: 62 88  70  Resp: 18 17  17   Temp: 97.7 F (36.5 C) (!) 97 F (36.1 C)  98.2 F (36.8 C)  TempSrc: Oral     SpO2: 100% 93% 94% 96%  Weight:      Height:       No data found.   Intake/Output Summary (Last 24 hours) at 02/24/2020 1709 Last data filed at 02/24/2020 1619 Gross per 24 hour  Intake 886.72 ml  Output 1000 ml  Net -113.28 ml   Filed Weights   02/23/20 1313 02/23/20 2031  Weight: 131.5 kg 132.7 kg    Exam:  GEN: NAD SKIN: Warm and dry EYES: EOMI ENT: MMM CV: RRR PULM: CTA B ABD: soft, distended, mid abdominal hernia, NT, +BS CNS: AAO x 3, non focal EXT: Bilateral lower extremity edema with left leg tenderness.  Erythema of bilateral legs and left posterior medial thigh   Data Reviewed:   I have personally reviewed following labs and imaging studies:  Labs: Labs show the following:   Basic Metabolic Panel: Recent Labs  Lab 02/23/20 1403 02/24/20 0433  NA 136 137  K 3.5 3.6  CL 100 100  CO2 28 28  GLUCOSE 245* 210*  BUN 53* 52*  CREATININE 2.65* 2.98*  CALCIUM 8.7* 8.7*  MG  --  1.6*   GFR Estimated Creatinine Clearance: 32 mL/min (A) (by C-G formula based on SCr of 2.98 mg/dL (H)). Liver Function Tests: Recent Labs  Lab 02/23/20 1403 02/24/20 0433  AST 30 22  ALT 24 21  ALKPHOS 74 70  BILITOT 1.1 1.0  PROT 7.0 6.6  ALBUMIN 3.3* 3.1*   No results for input(s): LIPASE, AMYLASE in the last 168 hours. No results for input(s): AMMONIA in the last 168 hours. Coagulation profile Recent Labs  Lab 02/24/20 0433  INR 1.5*    CBC: Recent Labs  Lab 02/23/20 1403 02/24/20 0433  WBC 5.8 3.8*  NEUTROABS 4.9  --   HGB 7.5* 7.2*  HCT 23.6* 22.6*  MCV 91.1 93.0  PLT 80* 75*   Cardiac Enzymes: No results for input(s): CKTOTAL, CKMB, CKMBINDEX, TROPONINI in the last 168 hours. BNP (last 3 results) No results for input(s): PROBNP in the last 8760 hours. CBG: Recent Labs  Lab 02/23/20 1735 02/23/20 2148 02/24/20 0812  02/24/20 1205 02/24/20 1637  GLUCAP 177* 194* 160* 221* 229*   D-Dimer: No results for input(s): DDIMER in the last 72 hours. Hgb A1c: Recent Labs    02/23/20 1540  HGBA1C 6.9*   Lipid Profile: No results for input(s): CHOL, HDL, LDLCALC, TRIG, CHOLHDL, LDLDIRECT in the last 72 hours. Thyroid function studies: No results for input(s): TSH, T4TOTAL, T3FREE, THYROIDAB in the last 72 hours.  Invalid input(s): FREET3 Anemia work up: No results for input(s): VITAMINB12, FOLATE, FERRITIN, TIBC, IRON, RETICCTPCT in the last 72 hours. Sepsis Labs: Recent Labs  Lab 02/23/20 1403 02/24/20  0433  WBC 5.8 3.8*  LATICACIDVEN 1.4  --     Microbiology Recent Results (from the past 240 hour(s))  Blood culture (routine x 2)     Status: None (Preliminary result)   Collection Time: 02/23/20  2:58 PM   Specimen: BLOOD  Result Value Ref Range Status   Specimen Description BLOOD BLOOD LEFT HAND  Final   Special Requests   Final    BOTTLES DRAWN AEROBIC AND ANAEROBIC Blood Culture adequate volume   Culture   Final    NO GROWTH < 24 HOURS Performed at [facility], [address] [place], [place] [address]    Report Status PENDING  Incomplete  Blood culture (routine x 2)     Status: None (Preliminary result)   Collection Time: 02/23/20  3:03 PM   Specimen: BLOOD  Result Value Ref Range Status   Specimen Description BLOOD RIGHT ANTECUBITAL  Final   Special Requests   Final    BOTTLES DRAWN AEROBIC AND ANAEROBIC Blood Culture adequate volume   Culture   Final    NO GROWTH < 24 HOURS Performed at [facility], [address], [place], [place] [address] [place] [address]    Report Status PENDING  Incomplete  SARS Coronavirus 2 by RT PCR (hospital order, performed in [place] hospital lab) Nasopharyngeal Nasopharyngeal Swab     Status: None   Collection Time: 02/23/20  3:04 PM   Specimen: Nasopharyngeal Swab  Result Value Ref Range Status   SARS Coronavirus 2 NEGATIVE NEGATIVE  Final    Comment: (NOTE) SARS-CoV-2 target nucleic acids are NOT DETECTED. The SARS-CoV-2 RNA is generally detectable in upper and lower respiratory specimens during the acute phase of infection. The lowest concentration of SARS-CoV-2 viral copies this assay can detect is 250 copies / mL. A negative result does not preclude SARS-CoV-2 infection and should not be used as the sole basis for treatment or other patient management decisions.  A negative result may occur with improper specimen collection / handling, submission of specimen other than nasopharyngeal swab, presence of viral mutation(s) within the areas targeted by this assay, and inadequate number of viral copies (<250 copies / mL). A negative result must be combined with clinical observations, patient history, and epidemiological information. Fact Sheet for Patients:   StrictlyIdeas.no Fact Sheet for Healthcare Providers: BankingDealers.co.za This test is not yet approved or cleared  by the [place] FDA and has been authorized for detection and/or diagnosis of SARS-CoV-2 by FDA under an Emergency Use Authorization (EUA).  This EUA will remain in effect (meaning this test can be used) for the duration of the COVID-19 declaration under Section 564(b)(1) of the Act, 21 U.S.C. section 360bbb-3(b)(1), unless the authorization is terminated or revoked sooner. Performed at [facility], [address] [place], [place] [address]     Procedures and diagnostic studies:  DG Chest 2 View  Result Date: 02/23/2020 CLINICAL DATA:  Pain following fall EXAM: CHEST - 2 VIEW COMPARISON:  Feb 03, 2020 FINDINGS: Lungs are clear. Heart is mildly enlarged with pulmonary vascularity normal. No adenopathy. No pneumothorax. No evident bone lesions. IMPRESSION: Mild cardiomegaly.  No edema or consolidation. Electronically Signed   By: Lowella Grip III M.D.   On: 02/23/2020 14:56   US  Venous Img Lower Bilateral  Result Date: 02/23/2020 CLINICAL DATA:  [place] yesterday, bilateral leg pain and edema EXAM: BILATERAL LOWER EXTREMITY VENOUS DOPPLER ULTRASOUND TECHNIQUE: Gray-scale sonography with compression, as well as color and duplex ultrasound, were  performed to evaluate the deep venous system(s) from the level of the common femoral vein through the popliteal and proximal calf veins. COMPARISON:  02/03/2020 FINDINGS: VENOUS Normal compressibility of the common femoral, superficial femoral, and popliteal veins, as well as the visualized calf veins. Visualized portions of profunda femoral vein and great saphenous vein unremarkable. No filling defects to suggest DVT on grayscale or color Doppler imaging. Doppler waveforms show normal direction of venous flow, normal respiratory plasticity and response to augmentation. Limited views of the contralateral common femoral vein are unremarkable. OTHER None. Limitations: none IMPRESSION: Negative. Electronically Signed   By: Randa Ngo M.D.   On: 02/23/2020 17:21   DG Femur Min 2 Views Left  Result Date: 02/23/2020 CLINICAL DATA:  Pain following fall EXAM: LEFT FEMUR 2 VIEWS COMPARISON:  None. FINDINGS: Frontal and lateral views obtained. No fracture or dislocation. No abnormal periosteal reaction. There is slight narrowing of the left hip joint. No knee joint effusion. There are multiple foci of arterial vascular calcification. IMPRESSION: No fracture or dislocation. Mild narrowing left hip joint. Multiple foci of arterial vascular calcification. Electronically Signed   By: Lowella Grip III M.D.   On: 02/23/2020 14:57    Medications:   . amLODipine  5 mg Oral Daily  . atorvastatin  20 mg Oral q1800  . hydrALAZINE  25 mg Oral TID  . insulin aspart  0-20 Units Subcutaneous TID WC  . insulin aspart  0-5 Units Subcutaneous QHS  . insulin detemir  8 Units Subcutaneous Daily  . ipratropium-albuterol  3 mL Nebulization Q6H  . iron  polysaccharides  150 mg Oral Daily  . lactulose  30 g Oral BID  . multivitamin with minerals  1 tablet Oral Daily  . pantoprazole  40 mg Oral BID  . [START ON 02/25/2020] Ensure Max Protein  11 oz Oral BID BM  . rifaximin  550 mg Oral BID  . spironolactone  25 mg Oral Daily  . torsemide  40 mg Oral Daily   Continuous Infusions: . cefTRIAXone (ROCEPHIN)  IV 2 g (02/24/20 1619)     LOS: 1 day   Haden Cavenaugh  [facility]     02/24/2020, 5:09 PM

## 2020-02-24 NOTE — Consult Note (Addendum)
[facility] Nurse Consult Note: Reason for Consult: Consult requested for bilat legs.  Pt is familiar to [facility] team from previous admission. He has chronic lymphedema and cellulitis. Wound type: Left leg with previous yellow, fluid filled blister which have ruptured and evolved into full thickness wounds in patchy areas to anterior and posterior calves; red and moist with mod amt yellow drainage.  Affected area is approx 6X6X.1cm Right leg with previous yellow, fluid filled blister which have ruptured and evolved into full thickness wounds in patchy areas to anterior calf; red and moist with mod amt yellow drainage.  Affected area is approx 4X2X.1cm Dressing procedure/placement/frequency: Topical treatment orders provided for bedside nurses to perform daily as follows to absorb drainage and provide light compression: Apply xeroform gauze to open wound areas on bilat legs Q day, then cover with ABD pads. Apply kerlex in a spiral fashion, beginning just behind toes, to middle calf, then ace wrap in the same manner. Please re-consult if further assistance is needed.  Thank-you,  Julien Girt MSN, [place], [place], [place], [place]

## 2020-02-24 NOTE — Evaluation (Signed)
Physical Therapy Evaluation Patient Details Name: Corey West MRN: [id number] DOB: 04-05-1951 Today's Date: 02/24/2020   History of Present Illness  Per MD notes: is a 69 y.o. male with medical history significant of morbid obesity, chronic lymphedema and venous stasis, recurrent UTIs.  Type 2 diabetes, CKD-4, liver cirrhosis, GAVE with hx of GI bleeds, chronic iron deficiency anemia, GERD, likely OSA, chronic thrombocytopenia, HTN, HLD present to the ED today with multiple complaints including increased weeping and swelling of his legs, left-sided rib and chest wall pain after a fall.  MD assessment includes: Cellulitis of left leg, chronic edema, and left-sided rib contusion with imaging negative for Fx.    Clinical Impression  Pt hesitant to participate with PT services initially secondary to L rib pain but agreed after education on benefits of activity and gentle encouragement.  Pt required physical assistance with bed mobility and transfers and was only able to amb a max of 3 feet before requiring to return to sitting.  Pt's SpO2 on 2LO2/min was 99% with nursing giving OK for trial on room air.  Pt's SpO2 remained >/= 94% on room air during the session, nursing aware.  Pt would not be safe to return to his prior living situation at this time.  Pt will benefit from PT services in a SNF setting upon discharge to safely address deficits listed in patient problem list for decreased caregiver assistance and eventual return to PLOF.     Follow Up Recommendations SNF    Equipment Recommendations  None recommended by PT    Recommendations for Other Services       Precautions / Restrictions Precautions Precautions: Fall Restrictions Weight Bearing Restrictions: No      Mobility  Bed Mobility Overal bed mobility: Needs Assistance Bed Mobility: Supine to Sit;Sit to Supine     Supine to sit: Min assist;HOB elevated Sit to supine: Mod assist;HOB elevated   General bed mobility comments:  Min to mod A for BLE control with elevated HOB  Transfers Overall transfer level: Needs assistance Equipment used: Rolling walker (2 wheeled) Transfers: Sit to/from Stand Sit to Stand: From elevated surface;Min assist         General transfer comment: Min verbal cues for sequencing for hand placement with poor eccentric control  Ambulation/Gait Ambulation/Gait assistance: Min guard Gait Distance (Feet): 3 Feet Assistive device: Rolling walker (2 wheeled) Gait Pattern/deviations: Step-to pattern;Trunk flexed Gait velocity: decreased   General Gait Details: Short, effortful steps with heavy lean on the RW for support  Stairs            Wheelchair Mobility    Modified Rankin (Stroke Patients Only)       Balance Overall balance assessment: Needs assistance Sitting-balance support: No upper extremity supported Sitting balance-Leahy Scale: Good     Standing balance support: Bilateral upper extremity supported Standing balance-Leahy Scale: Fair                               Pertinent Vitals/Pain Pain Assessment: 0-10 Pain Score: 5  Pain Location: L ribs Pain Descriptors / Indicators: Grimacing;Aching;Sore Pain Intervention(s): Premedicated before session;Monitored during session    [place] expects to be discharged to:: Private residence Living Arrangements: Alone Available Help at Discharge: Friend(s);Available PRN/intermittently Type of Home: Other(Comment)(Condo) Home Access: Stairs to enter Entrance Stairs-Rails: Right Entrance Stairs-Number of Steps: 4 Home Layout: One level Home Equipment: Walker - 2 wheels;Cane - single point  Prior Function Level of Independence: Independent with assistive device(s)         Comments: Mod independent with HH ambulation with a RW, w/c for MD apts, Ind with ADLs and tasks. Pt. reports taking sinkside baths. Independent with meal preparation, medication management, and driving.      Hand Dominance   Dominant Hand: Right    Extremity/Trunk Assessment   Upper Extremity Assessment Upper Extremity Assessment: Generalized weakness    Lower Extremity Assessment Lower Extremity Assessment: Generalized weakness       Communication   Communication: No difficulties  Cognition Arousal/Alertness: Awake/alert Behavior During Therapy: WFL for tasks assessed/performed Overall Cognitive Status: Within Functional Limits for tasks assessed                                        General Comments      Exercises Total Joint Exercises Ankle Circles/Pumps: Strengthening;Both;5 reps;10 reps Quad Sets: Strengthening;Both;5 reps;10 reps Gluteal Sets: Strengthening;Both;5 reps;10 reps Hip ABduction/ADduction: AAROM;Both;10 reps Straight Leg Raises: AAROM;Both;10 reps Long Arc Quad: Strengthening;Both;10 reps Other Exercises Other Exercises: Pt education provided on physiological benefits of activity Other Exercises: HEP education for BLE APs, GS, and QS x 10 each every 1-2 hours daily Other Exercises: Lateral scooting at EOB   Assessment/Plan    PT Assessment Patient needs continued PT services  PT Problem List Decreased strength;Decreased activity tolerance;Decreased balance;Decreased mobility;Decreased safety awareness       PT Treatment Interventions DME instruction;Gait training;Stair training;Functional mobility training;Therapeutic activities;Therapeutic exercise;Balance training;Patient/family education    PT Goals (Current goals can be found in the Care Plan section)  Acute Rehab PT Goals Patient Stated Goal: To return home PT Goal Formulation: With patient Time For Goal Achievement: 03/08/20 Potential to Achieve Goals: Fair    Frequency Min 2X/week   Barriers to discharge Inaccessible home environment;Decreased caregiver support      Co-evaluation               AM-PAC PT "6 Clicks" Mobility  Outcome Measure Help needed  turning from your back to your side while in a flat bed without using bedrails?: A Lot Help needed moving from lying on your back to sitting on the side of a flat bed without using bedrails?: A Lot Help needed moving to and from a bed to a chair (including a wheelchair)?: A Little Help needed standing up from a chair using your arms (e.g., wheelchair or bedside chair)?: A Little Help needed to walk in hospital room?: Total Help needed climbing 3-5 steps with a railing? : Total 6 Click Score: 12    End of Session Equipment Utilized During Treatment: Gait belt Activity Tolerance: Patient tolerated treatment well Patient left: with bed alarm set;with call bell/phone within reach;in bed Nurse Communication: Mobility status;Other (comment)(OK for room air trial per nsg with pt remaining >/= 94%, left on room air, nsg notified) PT Visit Diagnosis: Unsteadiness on feet (R26.81);Muscle weakness (generalized) (M62.81);Difficulty in walking, not elsewhere classified (R26.2);Pain Pain - Right/Left: Left Pain - part of body: (ribs)    Time: 1018-1050 PT Time Calculation (min) (ACUTE ONLY): 32 min   Charges:   PT Evaluation $PT Eval Moderate Complexity: 1 Mod PT Treatments $Therapeutic Exercise: 8-22 mins        D. Scott Tanner PT, DPT 02/24/20, 1:19 PM

## 2020-02-24 NOTE — Plan of Care (Signed)
  Problem: Health Behavior/Discharge Planning: Goal: Ability to manage health-related needs will improve Outcome: Progressing   Problem: Clinical Measurements: Goal: Ability to maintain clinical measurements within normal limits will improve Outcome: Progressing Goal: Will remain free from infection Outcome: Progressing Goal: Respiratory complications will improve Outcome: Progressing   Problem: Activity: Goal: Risk for activity intolerance will decrease Outcome: Progressing   Problem: Nutrition: Goal: Adequate nutrition will be maintained Outcome: Progressing   Problem: Coping: Goal: Level of anxiety will decrease Outcome: Progressing   Problem: Elimination: Goal: Will not experience complications related to bowel motility Outcome: Progressing   Problem: Pain Managment: Goal: General experience of comfort will improve Outcome: Progressing   Problem: Safety: Goal: Ability to remain free from injury will improve Outcome: Progressing   Problem: Skin Integrity: Goal: Risk for impaired skin integrity will decrease Outcome: Progressing   Problem: Clinical Measurements: Goal: Ability to avoid or minimize complications of infection will improve Outcome: Progressing   Problem: Skin Integrity: Goal: Skin integrity will improve Outcome: Progressing

## 2020-02-24 NOTE — TOC Progression Note (Signed)
Transition of Care [facility]) - Progression Note    Patient Details  Name: Corey West MRN: [id number] Date of Birth: 01/02/1951  Transition of Care [facility]) CM/SW [place], RN Phone Number: 02/24/2020, 9:31 AM  Clinical Narrative:    The patient discharged to [facility] on 5/14 and discharged home on 5/21.  Anticipate need to go back to rehab as he is unable to do bilateral leg wraps on his own and needs additional care and assistance to get more able to be independent.  FL2, Bed Search and PASSR completed, awaiting bed offers and will discuss with the patient        Expected Discharge Plan and Services                                                 Social Determinants of Health (SDOH) Interventions    Readmission Risk Interventions Readmission Risk Prevention Plan 02/02/2020 12/15/2019 02/03/2019  Transportation Screening Complete Complete Complete  PCP or Specialist Appt within 3-5 Days Complete - -  HRI or Home Care Consult Complete - -  Social Work Consult for [place] Planning/Counseling Complete - -  Palliative Care Screening Not Applicable - -  Medication Review Press photographer) Complete Complete Complete  PCP or Specialist appointment within 3-5 days of discharge - Complete -  [place] or [place] - Complete Complete  SW Recovery Care/Counseling Consult - - Not Complete  SW Consult Not Complete Comments - - NA  Palliative Care Screening - Not Applicable Not Applicable  Skilled Nursing Facility - Not Applicable Not Applicable  Some recent data might be hidden

## 2020-02-25 DIAGNOSIS — S20212A Contusion of left front wall of thorax, initial encounter: Secondary | ICD-10-CM

## 2020-02-25 LAB — BASIC METABOLIC PANEL
Anion gap: 10 (ref 5–15)
BUN: 54 mg/dL — ABNORMAL HIGH (ref 8–23)
CO2: 27 mmol/L (ref 22–32)
Calcium: 8.4 mg/dL — ABNORMAL LOW (ref 8.9–10.3)
Chloride: 95 mmol/L — ABNORMAL LOW (ref 98–111)
Creatinine, Ser: 3.1 mg/dL — ABNORMAL HIGH (ref 0.61–1.24)
GFR calc Af Amer: 23 mL/min — ABNORMAL LOW (ref >60)
GFR calc non Af Amer: 20 mL/min — ABNORMAL LOW (ref >60)
Glucose, Bld: 193 mg/dL — ABNORMAL HIGH (ref 70–99)
Potassium: 3.8 mmol/L (ref 3.5–5.1)
Sodium: 132 mmol/L — ABNORMAL LOW (ref 135–145)

## 2020-02-25 LAB — GLUCOSE, CAPILLARY
Glucose-Capillary: 140 mg/dL — ABNORMAL HIGH (ref 70–99)
Glucose-Capillary: 180 mg/dL — ABNORMAL HIGH (ref 70–99)
Glucose-Capillary: 236 mg/dL — ABNORMAL HIGH (ref 70–99)
Glucose-Capillary: 252 mg/dL — ABNORMAL HIGH (ref 70–99)

## 2020-02-25 LAB — CBC WITH DIFFERENTIAL/PLATELET
Abs Immature Granulocytes: 0.01 10*3/uL (ref 0.00–0.07)
Basophils Absolute: 0 10*3/uL (ref 0.0–0.1)
Basophils Relative: 1 %
Eosinophils Absolute: 0 10*3/uL (ref 0.0–0.5)
Eosinophils Relative: 0 %
HCT: 23.8 % — ABNORMAL LOW (ref 39.0–52.0)
Hemoglobin: 7.5 g/dL — ABNORMAL LOW (ref 13.0–17.0)
Immature Granulocytes: 0 %
Lymphocytes Relative: 13 %
Lymphs Abs: 0.5 10*3/uL — ABNORMAL LOW (ref 0.7–4.0)
MCH: 28.8 pg (ref 26.0–34.0)
MCHC: 31.5 g/dL (ref 30.0–36.0)
MCV: 91.5 fL (ref 80.0–100.0)
Monocytes Absolute: 0.4 10*3/uL (ref 0.1–1.0)
Monocytes Relative: 10 %
Neutro Abs: 3.2 10*3/uL (ref 1.7–7.7)
Neutrophils Relative %: 76 %
Platelets: 83 10*3/uL — ABNORMAL LOW (ref 150–400)
RBC: 2.6 MIL/uL — ABNORMAL LOW (ref 4.22–5.81)
RDW: 15.1 % (ref 11.5–15.5)
WBC: 4.1 10*3/uL (ref 4.0–10.5)
nRBC: 0 % (ref 0.0–0.2)

## 2020-02-25 NOTE — Progress Notes (Signed)
Progress Note    Corey West  MLY:[id number] DOB: [birth year]  DOA: 02/23/2020 PCP: System, Pcp Not In      Brief Narrative:    Medical records reviewed and are as summarized below:  Corey West is a 69 y.o. male male with medical history significant of morbid obesity, chronic lymphedema and venous stasis, recurrent UTIs.  Type 2 diabetes, CKD-4, liver cirrhosis, GAVE with hx of GI bleeds, chronic iron deficiency anemia, GERD, likely OSA, chronic thrombocytopenia, HTN, HLD present to the ED today with multiple complaints including increased weeping and swelling of his legs, left-sided rib and chest wall pain after a fall 2 or 3 days ago.  Due to the pain, patient has been unable to take care for himself.  He takes lactulose for his cirrhosis, states he often does not make it to the bathroom in time and has been in too much pain to clean himself up adequately.      Assessment/Plan:   Principal Problem:   Cellulitis of left leg Active Problems:   Gastroesophageal reflux disease   Hyperlipidemia   Iron deficiency anemia   Thrombocytopenia (HCC)   Edema   GAVE (gastric antral vascular ectasia)   Obesity, Class III, BMI 40-49.9 (morbid obesity) ([place])   Type 2 diabetes mellitus with diabetic nephropathy, with long-term current use of insulin (HCC)   Liver cirrhosis (HCC)   Anxiety   CKD stage 4 due to type 2 diabetes mellitus ([place])   Hyperlipidemia associated with type 2 diabetes mellitus (HCC)   Contusion of ribs, left, initial encounter   Cellulitis of left leg -continue IV Rocephin.  Analgesics as needed for pain.  Chronic Edema -Per patient is worse than baseline as he has not been able to wrap his legs sufficiently at home.  Now complicated by cellulitis as well. Continue local wound care.  Assistance from wound care nurse appreciated. --Lymphedema wraps  Left-sided rib contusion -x-rays in the ED were negative for fractures.    Analgesics as needed for pain.   Continue PT and OT.  Type 2 diabetes mellitus -controlled, A1c 6.6% on 12/15/2019.  Continue Levemir and NovoLog for hyperglycemia.  Liver cirrhosis with coagulopathy - compensated --Continue rifaximin and lactulose --Continue torsemide and spironolactone  Thrombocytopenia - chronic, secondary to liver cirrhosis.  --Monitor CBC  CKD stage 4 - --Avoid nephrotoxic agents and hypotension --Renally dose meds as indicated --Monitor renal function and electrolytes   GERD -continue home PPI  GAVE (gastric antral vascular ectasia) -with history of GI bleeding likely secondary to this.  Appears stable at this time. --Monitor CBC  Iron Deficiency Anemia - due to GAVE most likely.  Hemoglobin 7.5 on admission, stable from prior. --monitor for bleeding --monitor CBC --transfuse if Hbg < 7.0 --continue supplement  Hyperlipidemia -continue statin  Anxiety -continue home Ativan    Body mass index is 43.2 kg/m.  (Morbid obesity)-this complicates overall care and prognosis.      Family Communication/Anticipated D/C date and plan/Code Status   DVT prophylaxis: SCDs because of thrombocytopenia and coagulopathy Code Status: Full code Family Communication: Plan discussed with patient Disposition Plan:    Status is: Inpatient  Remains inpatient appropriate because:Unsafe d/c plan, IV treatments appropriate due to intensity of illness or inability to take PO and Inpatient level of care appropriate due to severity of illness   Dispo: The patient is from: Home              Anticipated d/c is to:  SNF              Anticipated d/c date is: 3 days              Patient currently is not medically stable to d/c.            Subjective:   He still complains of left-sided chest wall pain and pain in the lower extremities, especially in the left leg.  However, he was able to work with PT yesterday.  Objective:    Vitals:   02/24/20 2330 02/25/20 0737 02/25/20 0747  02/25/20 1548  BP: (!) 147/61 (!) 142/89  (!) 141/54  Pulse: 76 64  67  Resp: 18 16  16   Temp: 98 F (36.7 C) 98.8 F (37.1 C)  98.1 F (36.7 C)  TempSrc:  Oral  Oral  SpO2: 95% 93% 97% 93%  Weight:      Height:       No data found.   Intake/Output Summary (Last 24 hours) at 02/25/2020 1724 Last data filed at 02/25/2020 1101 Gross per 24 hour  Intake 240 ml  Output 800 ml  Net -560 ml   Filed Weights   02/23/20 1313 02/23/20 2031  Weight: 131.5 kg 132.7 kg    Exam:  GEN: NAD SKIN: Warm and dry.  Chronic wounds on bilateral legs EYES: EOMI ENT: MMM CV: RRR PULM: CTA B ABD: soft, distended, mid abdominal hernia, NT, +BS CNS: AAO x 3, non focal EXT: Bilateral lower extremity edema with left leg tenderness.  Erythema of bilateral legs and left posterior medial thigh   Data Reviewed:   I have personally reviewed following labs and imaging studies:  Labs: Labs show the following:   Basic Metabolic Panel: Recent Labs  Lab 02/23/20 1403 02/23/20 1403 02/24/20 0433 02/25/20 0857  NA 136  --  137 132*  K 3.5   < > 3.6 3.8  CL 100  --  100 95*  CO2 28  --  28 27  GLUCOSE 245*  --  210* 193*  BUN 53*  --  52* 54*  CREATININE 2.65*  --  2.98* 3.10*  CALCIUM 8.7*  --  8.7* 8.4*  MG  --   --  1.6*  --    < > = values in this interval not displayed.   GFR Estimated Creatinine Clearance: 30.8 mL/min (A) (by C-G formula based on SCr of 3.1 mg/dL (H)). Liver Function Tests: Recent Labs  Lab 02/23/20 1403 02/24/20 0433  AST 30 22  ALT 24 21  ALKPHOS 74 70  BILITOT 1.1 1.0  PROT 7.0 6.6  ALBUMIN 3.3* 3.1*   No results for input(s): LIPASE, AMYLASE in the last 168 hours. No results for input(s): AMMONIA in the last 168 hours. Coagulation profile Recent Labs  Lab 02/24/20 0433  INR 1.5*    CBC: Recent Labs  Lab 02/23/20 1403 02/24/20 0433 02/25/20 0857  WBC 5.8 3.8* 4.1  NEUTROABS 4.9  --  3.2  HGB 7.5* 7.2* 7.5*  HCT 23.6* 22.6* 23.8*  MCV  91.1 93.0 91.5  PLT 80* 75* 83*   Cardiac Enzymes: No results for input(s): CKTOTAL, CKMB, CKMBINDEX, TROPONINI in the last 168 hours. BNP (last 3 results) No results for input(s): PROBNP in the last 8760 hours. CBG: Recent Labs  Lab 02/24/20 1637 02/24/20 2110 02/25/20 0737 02/25/20 1152 02/25/20 1650  GLUCAP 229* 186* 140* 180* 236*   D-Dimer: No results for input(s): DDIMER in the last 72 hours.  Hgb A1c: Recent Labs    02/23/20 1540  HGBA1C 6.9*   Lipid Profile: No results for input(s): CHOL, HDL, LDLCALC, TRIG, CHOLHDL, LDLDIRECT in the last 72 hours. Thyroid function studies: No results for input(s): TSH, T4TOTAL, T3FREE, THYROIDAB in the last 72 hours.  Invalid input(s): FREET3 Anemia work up: No results for input(s): VITAMINB12, FOLATE, FERRITIN, TIBC, IRON, RETICCTPCT in the last 72 hours. Sepsis Labs: Recent Labs  Lab 02/23/20 1403 02/24/20 0433 02/25/20 0857  WBC 5.8 3.8* 4.1  LATICACIDVEN 1.4  --   --     Microbiology Recent Results (from the past 240 hour(s))  Blood culture (routine x 2)     Status: None (Preliminary result)   Collection Time: 02/23/20  2:58 PM   Specimen: BLOOD  Result Value Ref Range Status   Specimen Description BLOOD BLOOD LEFT HAND  Final   Special Requests   Final    BOTTLES DRAWN AEROBIC AND ANAEROBIC Blood Culture adequate volume   Culture   Final    NO GROWTH 2 DAYS Performed at [facility], [address], [place], [place] [address]    Report Status PENDING  Incomplete  Blood culture (routine x 2)     Status: None (Preliminary result)   Collection Time: 02/23/20  3:03 PM   Specimen: BLOOD  Result Value Ref Range Status   Specimen Description BLOOD RIGHT ANTECUBITAL  Final   Special Requests   Final    BOTTLES DRAWN AEROBIC AND ANAEROBIC Blood Culture adequate volume   Culture   Final    NO GROWTH 2 DAYS Performed at [facility], [address], [place], [place] [address]    Report Status  PENDING  Incomplete  SARS Coronavirus 2 by RT PCR (hospital order, performed in [place] hospital lab) Nasopharyngeal Nasopharyngeal Swab     Status: None   Collection Time: 02/23/20  3:04 PM   Specimen: Nasopharyngeal Swab  Result Value Ref Range Status   SARS Coronavirus 2 NEGATIVE NEGATIVE Final    Comment: (NOTE) SARS-CoV-2 target nucleic acids are NOT DETECTED. The SARS-CoV-2 RNA is generally detectable in upper and lower respiratory specimens during the acute phase of infection. The lowest concentration of SARS-CoV-2 viral copies this assay can detect is 250 copies / mL. A negative result does not preclude SARS-CoV-2 infection and should not be used as the sole basis for treatment or other patient management decisions.  A negative result may occur with improper specimen collection / handling, submission of specimen other than nasopharyngeal swab, presence of viral mutation(s) within the areas targeted by this assay, and inadequate number of viral copies (<250 copies / mL). A negative result must be combined with clinical observations, patient history, and epidemiological information. Fact Sheet for Patients:   StrictlyIdeas.no Fact Sheet for Healthcare Providers: BankingDealers.co.za This test is not yet approved or cleared  by the [place] FDA and has been authorized for detection and/or diagnosis of SARS-CoV-2 by FDA under an Emergency Use Authorization (EUA).  This EUA will remain in effect (meaning this test can be used) for the duration of the COVID-19 declaration under Section 564(b)(1) of the Act, 21 U.S.C. section 360bbb-3(b)(1), unless the authorization is terminated or revoked sooner. Performed at [facility], [address] [place], [place] [address]     Procedures and diagnostic studies:  No results found.  Medications:   . amLODipine  5 mg Oral Daily  . atorvastatin  20 mg Oral q1800  .  hydrALAZINE  25 mg Oral TID  . insulin aspart  0-20 Units Subcutaneous TID WC  . insulin aspart  0-5 Units Subcutaneous QHS  . insulin detemir  8 Units Subcutaneous Daily  . ipratropium-albuterol  3 mL Nebulization BID  . iron polysaccharides  150 mg Oral Daily  . lactulose  30 g Oral BID  . multivitamin with minerals  1 tablet Oral Daily  . pantoprazole  40 mg Oral BID  . Ensure Max Protein  11 oz Oral BID BM  . rifaximin  550 mg Oral BID  . spironolactone  25 mg Oral Daily  . torsemide  40 mg Oral Daily   Continuous Infusions: . cefTRIAXone (ROCEPHIN)  IV 2 g (02/25/20 1702)     LOS: 2 days   Yaneth Fairbairn  [facility]     02/25/2020, 5:24 PM

## 2020-02-25 NOTE — TOC Progression Note (Signed)
Transition of Care [facility]) - Progression Note    Patient Details  Name: Corey West MRN: [id number] Date of Birth: [birth year]  Transition of Care [facility]) CM/SW [place], RN Phone Number: 02/25/2020, 11:03 AM  Clinical Narrative:    Spoke with the patient at the bedside, he has agreed to the bed offer from [facility] and stated that he wants a Private room, I notified Tammy at [facility] of the bed acceptance and that he wants a private room        Expected Discharge Plan and Services                                                 Social Determinants of Health (SDOH) Interventions    Readmission Risk Interventions Readmission Risk Prevention Plan 02/02/2020 12/15/2019 02/03/2019  Transportation Screening Complete Complete Complete  PCP or Specialist Appt within 3-5 Days Complete - -  HRI or Home Care Consult Complete - -  Social Work Consult for [place] Planning/Counseling Complete - -  Palliative Care Screening Not Applicable - -  Medication Review Press photographer) Complete Complete Complete  PCP or Specialist appointment within 3-5 days of discharge - Complete -  [place] or [place] - Complete Complete  SW Recovery Care/Counseling Consult - - Not Complete  SW Consult Not Complete Comments - - NA  Palliative Care Screening - Not Applicable Not Applicable  Skilled Nursing Facility - Not Applicable Not Applicable  Some recent data might be hidden

## 2020-02-26 DIAGNOSIS — R609 Edema, unspecified: Secondary | ICD-10-CM

## 2020-02-26 LAB — CBC WITH DIFFERENTIAL/PLATELET
Abs Immature Granulocytes: 0.02 10*3/uL (ref 0.00–0.07)
Basophils Absolute: 0 10*3/uL (ref 0.0–0.1)
Basophils Relative: 1 %
Eosinophils Absolute: 0 10*3/uL (ref 0.0–0.5)
Eosinophils Relative: 0 %
HCT: 24.5 % — ABNORMAL LOW (ref 39.0–52.0)
Hemoglobin: 8.1 g/dL — ABNORMAL LOW (ref 13.0–17.0)
Immature Granulocytes: 1 %
Lymphocytes Relative: 11 %
Lymphs Abs: 0.4 10*3/uL — ABNORMAL LOW (ref 0.7–4.0)
MCH: 28.8 pg (ref 26.0–34.0)
MCHC: 33.1 g/dL (ref 30.0–36.0)
MCV: 87.2 fL (ref 80.0–100.0)
Monocytes Absolute: 0.3 10*3/uL (ref 0.1–1.0)
Monocytes Relative: 9 %
Neutro Abs: 2.9 10*3/uL (ref 1.7–7.7)
Neutrophils Relative %: 78 %
Platelets: 80 10*3/uL — ABNORMAL LOW (ref 150–400)
RBC: 2.81 MIL/uL — ABNORMAL LOW (ref 4.22–5.81)
RDW: 15.1 % (ref 11.5–15.5)
WBC: 3.6 10*3/uL — ABNORMAL LOW (ref 4.0–10.5)
nRBC: 0 % (ref 0.0–0.2)

## 2020-02-26 LAB — GLUCOSE, CAPILLARY
Glucose-Capillary: 165 mg/dL — ABNORMAL HIGH (ref 70–99)
Glucose-Capillary: 178 mg/dL — ABNORMAL HIGH (ref 70–99)
Glucose-Capillary: 199 mg/dL — ABNORMAL HIGH (ref 70–99)
Glucose-Capillary: 252 mg/dL — ABNORMAL HIGH (ref 70–99)

## 2020-02-26 LAB — BASIC METABOLIC PANEL
Anion gap: 10 (ref 5–15)
BUN: 57 mg/dL — ABNORMAL HIGH (ref 8–23)
CO2: 29 mmol/L (ref 22–32)
Calcium: 8.8 mg/dL — ABNORMAL LOW (ref 8.9–10.3)
Chloride: 96 mmol/L — ABNORMAL LOW (ref 98–111)
Creatinine, Ser: 3.11 mg/dL — ABNORMAL HIGH (ref 0.61–1.24)
GFR calc Af Amer: 23 mL/min — ABNORMAL LOW (ref >60)
GFR calc non Af Amer: 20 mL/min — ABNORMAL LOW (ref >60)
Glucose, Bld: 176 mg/dL — ABNORMAL HIGH (ref 70–99)
Potassium: 3.6 mmol/L (ref 3.5–5.1)
Sodium: 135 mmol/L (ref 135–145)

## 2020-02-26 LAB — SARS CORONAVIRUS 2 BY RT PCR (HOSPITAL ORDER, PERFORMED IN ~~LOC~~ HOSPITAL LAB): SARS Coronavirus 2: NEGATIVE

## 2020-02-26 MED ORDER — CALCIUM CARBONATE ANTACID 500 MG PO CHEW
1.0000 | CHEWABLE_TABLET | Freq: Two times a day (BID) | ORAL | Status: DC | PRN
  Administered 2020-02-26: 200 mg via ORAL
  Filled 2020-02-26: qty 1

## 2020-02-26 NOTE — Progress Notes (Signed)
Progress Note    Corey West  TDH:[id number] DOB: 11/14/1950  DOA: 02/23/2020 PCP: System, Pcp Not In      Brief Narrative:    Medical records reviewed and are as summarized below:  Corey West is a 69 y.o. male male with medical history significant of morbid obesity, chronic lymphedema and venous stasis, recurrent UTIs.  Type 2 diabetes, CKD-4, liver cirrhosis, GAVE with hx of GI bleeds, chronic iron deficiency anemia, GERD, likely OSA, chronic thrombocytopenia, HTN, HLD present to the ED today with multiple complaints including increased weeping and swelling of his legs, left-sided rib and chest wall pain after a fall 2 or 3 days ago.  Due to the pain, patient has been unable to take care for himself.  He takes lactulose for his cirrhosis, states he often does not make it to the bathroom in time and has been in too much pain to clean himself up adequately.      Assessment/Plan:   Principal Problem:   Cellulitis of left leg Active Problems:   Gastroesophageal reflux disease   Hyperlipidemia   Iron deficiency anemia   Thrombocytopenia (HCC)   Edema   GAVE (gastric antral vascular ectasia)   Obesity, Class III, BMI 40-49.9 (morbid obesity) ([place])   Type 2 diabetes mellitus with diabetic nephropathy, with long-term current use of insulin (HCC)   Liver cirrhosis (HCC)   Anxiety   CKD stage 4 due to type 2 diabetes mellitus ([place])   Hyperlipidemia associated with type 2 diabetes mellitus (HCC)   Contusion of ribs, left, initial encounter   Cellulitis of left leg -change IV Rocephin to oral Keflex.  Analgesics as needed for pain.  Chronic Edema - Continue local wound care.  Assistance from wound care nurse appreciated. --Lymphedema wraps  Left-sided rib contusion -x-rays in the ED were negative for fractures.    Analgesics as needed for pain.  Continue PT and OT.  Type 2 diabetes mellitus -controlled, A1c 6.6% on 12/15/2019.  Continue Levemir and NovoLog for  hyperglycemia.  Liver cirrhosis with coagulopathy - compensated --Continue rifaximin and lactulose --Continue torsemide and spironolactone  Thrombocytopenia - chronic, secondary to liver cirrhosis.  --Monitor CBC  CKD stage 4 -creatinine is stable. --Avoid nephrotoxic agents and hypotension --Renally dose meds as indicated --Monitor renal function and electrolytes   GERD -continue home PPI  GAVE (gastric antral vascular ectasia) -with history of GI bleeding likely secondary to this.  Appears stable at this time. --Monitor CBC  Iron Deficiency Anemia - due to GAVE most likely.  H&H is stable. --monitor for bleeding --monitor CBC --transfuse if Hbg < 7.0 --continue supplement  Hyperlipidemia -continue statin  Anxiety -continue home Ativan    Body mass index is 43.2 kg/m.  (Morbid obesity)-this complicates overall care and prognosis.   Patient has been informed that he will likely be discharged to SNF tomorrow.  He is agreeable with the plan.   Family Communication/Anticipated D/C date and plan/Code Status   DVT prophylaxis: SCDs because of thrombocytopenia and coagulopathy Code Status: Full code Family Communication: Plan discussed with patient Disposition Plan:    Status is: Inpatient  Remains inpatient appropriate because:Unsafe d/c plan, IV treatments appropriate due to intensity of illness or inability to take PO and Inpatient level of care appropriate due to severity of illness   Dispo: The patient is from: Home              Anticipated d/c is to: SNF  Anticipated d/c date is: 1 day              Patient currently is not medically stable to d/c.            Subjective:   Overnight events noted.  According to [facility], RN, patient was very upset and agitated this morning.  Apparently he refused his lactulose last night.  Reportedly, he called the police on the nursing staff this morning.  Security has to be called in because he  was agitated.  At the time of my visit this morning, patient was calm and cooperative.  He complained of pain in the left lateral side of his chest wall.  He said that he was not happy about the way he was being turned from side to side.  Objective:    Vitals:   02/25/20 2201 02/25/20 2213 02/25/20 2356 02/26/20 0823  BP: (!) 137/54 (!) 137/54 (!) 146/67 (!) 132/51  Pulse:  66 66 66  Resp:   17 18  Temp:   98.2 F (36.8 C) 98.8 F (37.1 C)  TempSrc:    Oral  SpO2:   94% 91%  Weight:      Height:       No data found.   Intake/Output Summary (Last 24 hours) at 02/26/2020 1619 Last data filed at 02/26/2020 1028 Gross per 24 hour  Intake 120 ml  Output 300 ml  Net -180 ml   Filed Weights   02/23/20 1313 02/23/20 2031  Weight: 131.5 kg 132.7 kg    Exam:  GEN: NAD SKIN: Warm and dry.  Chronic wounds on bilateral legs EYES: No pallor or icterus ENT: MMM CV: RRR PULM: CTA B ABD: soft, distended, mid abdominal hernia, NT, +BS CNS: AAO x 3, non focal EXT: Bilateral lower extremity edema with left leg tenderness.  Erythema of bilateral legs and left posterior medial thigh   Data Reviewed:   I have personally reviewed following labs and imaging studies:  Labs: Labs show the following:   Basic Metabolic Panel: Recent Labs  Lab 02/23/20 1403 02/23/20 1403 02/24/20 0433 02/24/20 0433 02/25/20 0857 02/26/20 0931  NA 136  --  137  --  132* 135  K 3.5   < > 3.6   < > 3.8 3.6  CL 100  --  100  --  95* 96*  CO2 28  --  28  --  27 29  GLUCOSE 245*  --  210*  --  193* 176*  BUN 53*  --  52*  --  54* 57*  CREATININE 2.65*  --  2.98*  --  3.10* 3.11*  CALCIUM 8.7*  --  8.7*  --  8.4* 8.8*  MG  --   --  1.6*  --   --   --    < > = values in this interval not displayed.   GFR Estimated Creatinine Clearance: 30.7 mL/min (A) (by C-G formula based on SCr of 3.11 mg/dL (H)). Liver Function Tests: Recent Labs  Lab 02/23/20 1403 02/24/20 0433  AST 30 22  ALT 24 21    ALKPHOS 74 70  BILITOT 1.1 1.0  PROT 7.0 6.6  ALBUMIN 3.3* 3.1*   No results for input(s): LIPASE, AMYLASE in the last 168 hours. No results for input(s): AMMONIA in the last 168 hours. Coagulation profile Recent Labs  Lab 02/24/20 0433  INR 1.5*    CBC: Recent Labs  Lab 02/23/20 1403 02/24/20 0433 02/25/20 0857 02/26/20 8119  WBC 5.8 3.8* 4.1 3.6*  NEUTROABS 4.9  --  3.2 2.9  HGB 7.5* 7.2* 7.5* 8.1*  HCT 23.6* 22.6* 23.8* 24.5*  MCV 91.1 93.0 91.5 87.2  PLT 80* 75* 83* 80*   Cardiac Enzymes: No results for input(s): CKTOTAL, CKMB, CKMBINDEX, TROPONINI in the last 168 hours. BNP (last 3 results) No results for input(s): PROBNP in the last 8760 hours. CBG: Recent Labs  Lab 02/25/20 1152 02/25/20 1650 02/25/20 2116 02/26/20 0840 02/26/20 1146  GLUCAP 180* 236* 252* 165* 178*   D-Dimer: No results for input(s): DDIMER in the last 72 hours. Hgb A1c: No results for input(s): HGBA1C in the last 72 hours. Lipid Profile: No results for input(s): CHOL, HDL, LDLCALC, TRIG, CHOLHDL, LDLDIRECT in the last 72 hours. Thyroid function studies: No results for input(s): TSH, T4TOTAL, T3FREE, THYROIDAB in the last 72 hours.  Invalid input(s): FREET3 Anemia work up: No results for input(s): VITAMINB12, FOLATE, FERRITIN, TIBC, IRON, RETICCTPCT in the last 72 hours. Sepsis Labs: Recent Labs  Lab 02/23/20 1403 02/24/20 0433 02/25/20 0857 02/26/20 0931  WBC 5.8 3.8* 4.1 3.6*  LATICACIDVEN 1.4  --   --   --     Microbiology Recent Results (from the past 240 hour(s))  Blood culture (routine x 2)     Status: None (Preliminary result)   Collection Time: 02/23/20  2:58 PM   Specimen: BLOOD  Result Value Ref Range Status   Specimen Description BLOOD BLOOD LEFT HAND  Final   Special Requests   Final    BOTTLES DRAWN AEROBIC AND ANAEROBIC Blood Culture adequate volume   Culture   Final    NO GROWTH 3 DAYS Performed at [facility], [address],  [place], [place] [address]    Report Status PENDING  Incomplete  Blood culture (routine x 2)     Status: None (Preliminary result)   Collection Time: 02/23/20  3:03 PM   Specimen: BLOOD  Result Value Ref Range Status   Specimen Description BLOOD RIGHT ANTECUBITAL  Final   Special Requests   Final    BOTTLES DRAWN AEROBIC AND ANAEROBIC Blood Culture adequate volume   Culture   Final    NO GROWTH 3 DAYS Performed at [facility], [address] [place],  [address]    Report Status PENDING  Incomplete  SARS Coronavirus 2 by RT PCR (hospital order, performed in [place] hospital lab) Nasopharyngeal Nasopharyngeal Swab     Status: None   Collection Time: 02/23/20  3:04 PM   Specimen: Nasopharyngeal Swab  Result Value Ref Range Status   SARS Coronavirus 2 NEGATIVE NEGATIVE Final    Comment: (NOTE) SARS-CoV-2 target nucleic acids are NOT DETECTED. The SARS-CoV-2 RNA is generally detectable in upper and lower respiratory specimens during the acute phase of infection. The lowest concentration of SARS-CoV-2 viral copies this assay can detect is 250 copies / mL. A negative result does not preclude SARS-CoV-2 infection and should not be used as the sole basis for treatment or other patient management decisions.  A negative result may occur with improper specimen collection / handling, submission of specimen other than nasopharyngeal swab, presence of viral mutation(s) within the areas targeted by this assay, and inadequate number of viral copies (<250 copies / mL). A negative result must be combined with clinical observations, patient history, and epidemiological information. Fact Sheet for Patients:   StrictlyIdeas.no Fact Sheet for Healthcare Providers: BankingDealers.co.za This test is not yet approved or cleared  by the  [place] Architectural technologist and has been authorized for detection and/or diagnosis of SARS-CoV-2 by FDA under an  Print production planner (EUA).  This EUA will remain in effect (meaning this test can be used) for the duration of the COVID-19 declaration under Section 564(b)(1) of the Act, 21 U.S.C. section 360bbb-3(b)(1), unless the authorization is terminated or revoked sooner. Performed at [facility], [address] [place], [place] [address]     Procedures and diagnostic studies:  No results found.  Medications:   . amLODipine  5 mg Oral Daily  . atorvastatin  20 mg Oral q1800  . hydrALAZINE  25 mg Oral TID  . insulin aspart  0-20 Units Subcutaneous TID WC  . insulin aspart  0-5 Units Subcutaneous QHS  . insulin detemir  8 Units Subcutaneous Daily  . iron polysaccharides  150 mg Oral Daily  . lactulose  30 g Oral BID  . multivitamin with minerals  1 tablet Oral Daily  . pantoprazole  40 mg Oral BID  . Ensure Max Protein  11 oz Oral BID BM  . rifaximin  550 mg Oral BID  . spironolactone  25 mg Oral Daily  . torsemide  40 mg Oral Daily   Continuous Infusions: . cefTRIAXone (ROCEPHIN)  IV 2 g (02/25/20 1702)     LOS: 3 days   Kristjan Derner  [facility]     02/26/2020, 4:19 PM

## 2020-02-26 NOTE — Progress Notes (Signed)
Physical Therapy Treatment Patient Details Name: Corey West MRN: [id number] DOB: 05-Sep-1951 Today's Date: 02/26/2020    History of Present Illness Per MD notes: is a 69 y.o. male with medical history significant of morbid obesity, chronic lymphedema and venous stasis, recurrent UTIs.  Type 2 diabetes, CKD-4, liver cirrhosis, GAVE with hx of GI bleeds, chronic iron deficiency anemia, GERD, likely OSA, chronic thrombocytopenia, HTN, HLD present to the ED today with multiple complaints including increased weeping and swelling of his legs, left-sided rib and chest wall pain after a fall.  MD assessment includes: Cellulitis of left leg, chronic edema, and left-sided rib contusion with imaging negative for Fx.    PT Comments    Patient wants to get up out of bed. He performs bed mobility supien <> with MI and needs assist with tubes. He is a bit impulsive with mobility. He transfers sit to stand with RW and is MI. He is able to stand x 15 minutes while waiting for nsg to get clean linnens for the bed due to it being wet. He is MI for gait 4 feet fwd/ bwd x 2 sets. Patient will continue to benefit from skilled PT to improve strength and mobility.   Follow Up Recommendations        Equipment Recommendations       Recommendations for Other Services       Precautions / Restrictions Restrictions Weight Bearing Restrictions: No    Mobility  Bed Mobility Overal bed mobility: Modified Independent Bed Mobility: Supine to Sit;Sit to Supine     Supine to sit: Modified independent (Device/Increase time) Sit to supine: Modified independent (Device/Increase time)   General bed mobility comments: needs assist with tubes  Transfers Overall transfer level: Modified independent Equipment used: Rolling walker (2 wheeled) Transfers: Sit to/from Stand Sit to Stand: Modified independent (Device/Increase time)         General transfer comment: BRW and assist fro tubes  Ambulation/Gait                  Stairs             Wheelchair Mobility    Modified Rankin (Stroke Patients Only)       Balance Overall balance assessment: Modified Independent Sitting-balance support: No upper extremity supported Sitting balance-Leahy Scale: Normal     Standing balance support: Bilateral upper extremity supported Standing balance-Leahy Scale: Good                              Cognition Arousal/Alertness: Awake/alert Behavior During Therapy: WFL for tasks assessed/performed Overall Cognitive Status: Within Functional Limits for tasks assessed                                        Exercises      General Comments        Pertinent Vitals/Pain Pain Assessment: Faces Faces Pain Scale: Hurts a little bit    Home Living                      Prior Function            PT Goals (current goals can now be found in the care plan section) Acute Rehab PT Goals Patient Stated Goal: To return home    Frequency  PT Plan      Co-evaluation              AM-PAC PT "6 Clicks" Mobility   Outcome Measure                   End of Session               Time: 9090-3014 PT Time Calculation (min) (ACUTE ONLY): 25 min  Charges:  $Therapeutic Activity: 23-37 mins                        Alanson Puls, PT DPT 02/26/2020, 3:08 PM

## 2020-02-26 NOTE — TOC Progression Note (Signed)
Transition of Care [facility]) - Progression Note    Patient Details  Name: Corey West MRN: [id number] Date of Birth: 11/03/1950  Transition of Care [facility]) CM/SW [place], RN Phone Number: 02/26/2020, 3:42 PM  Clinical Narrative:    Notified Tammy at [facility] that the plan is to DC to SNF tomorrow        Expected Discharge Plan and Services                                                 Social Determinants of Health (SDOH) Interventions    Readmission Risk Interventions Readmission Risk Prevention Plan 02/02/2020 12/15/2019 02/03/2019  Transportation Screening Complete Complete Complete  PCP or Specialist Appt within 3-5 Days Complete - -  HRI or Home Care Consult Complete - -  Social Work Consult for [place] Planning/Counseling Complete - -  Palliative Care Screening Not Applicable - -  Medication Review Press photographer) Complete Complete Complete  PCP or Specialist appointment within 3-5 days of discharge - Complete -  [place] or [place] - Complete Complete  SW Recovery Care/Counseling Consult - - Not Complete  SW Consult Not Complete Comments - - NA  Palliative Care Screening - Not Applicable Not Applicable  Skilled Nursing Facility - Not Applicable Not Applicable  Some recent data might be hidden

## 2020-02-26 NOTE — Care Management Important Message (Signed)
Important Message  Patient Details  Name: Corey West MRN: [id number] Date of Birth: 1951/02/21   Medicare Important Message Given:  Yes     Dannette Barbara 02/26/2020, 1:39 PM

## 2020-02-27 LAB — GLUCOSE, CAPILLARY
Glucose-Capillary: 145 mg/dL — ABNORMAL HIGH (ref 70–99)
Glucose-Capillary: 170 mg/dL — ABNORMAL HIGH (ref 70–99)
Glucose-Capillary: 144 mg/dL — ABNORMAL HIGH (ref 70–99)
Glucose-Capillary: 252 mg/dL — ABNORMAL HIGH (ref 70–99)

## 2020-02-27 MED ORDER — TRAMADOL HCL 50 MG PO TABS: 50.0000 mg | ORAL_TABLET | Freq: Three times a day (TID) | ORAL | 0 refills | Status: DC | PRN

## 2020-02-27 MED ORDER — CLINDAMYCIN HCL 300 MG PO CAPS
300.0000 mg | ORAL_CAPSULE | Freq: Three times a day (TID) | ORAL | 0 refills | Status: AC
Start: 2020-02-27 — End: 2020-03-01

## 2020-02-27 MED ORDER — HYDRALAZINE HCL 25 MG PO TABS: 25.0000 mg | ORAL_TABLET | Freq: Two times a day (BID) | ORAL | Status: DC

## 2020-02-27 NOTE — TOC Transition Note (Signed)
Transition of Care [facility]) - CM/SW Discharge Note   Patient Details  Name: Corey West MRN: [id number] Date of Birth: 04-24-1951  [facility] CM/SW Contact:  Boris Sharper, LCSW Phone Number: 02/27/2020, 2:54 PM   Clinical Narrative:    Pt medically stable for discharge per MD. Pt will be transported to [facility] via EMS,  CSW will arrange transport. Pt will be going to room 706. Pt's family contact aware of discharge.   Final next level of care: Skilled Nursing Facility Barriers to Discharge: No Barriers Identified   Patient Goals and CMS Choice        Discharge Placement              Patient chooses bed at: [facility] [place] Patient to be transferred to facility by: EMS Name of family member notified: [name] Patient and family notified of of transfer: 02/27/20  Discharge Plan and Services                                     Social Determinants of Health (SDOH) Interventions     Readmission Risk Interventions Readmission Risk Prevention Plan 02/02/2020 12/15/2019 02/03/2019  Transportation Screening Complete Complete Complete  PCP or Specialist Appt within 3-5 Days Complete - -  HRI or Home Care Consult Complete - -  Social Work Consult for [place] Planning/Counseling Complete - -  Palliative Care Screening Not Applicable - -  Medication Review Press photographer) Complete Complete Complete  PCP or Specialist appointment within 3-5 days of discharge - Complete -  [place] or [place] - Complete Complete  SW Recovery Care/Counseling Consult - - Not Complete  SW Consult Not Complete Comments - - NA  Palliative Care Screening - Not Applicable Not Applicable  Skilled Nursing Facility - Not Applicable Not Applicable  Some recent data might be hidden

## 2020-02-27 NOTE — Progress Notes (Signed)
Pt was discharged at 2215 via EMS. Vital signs checked before discharge and wnl. Pt verbalized relief at being discharged.

## 2020-02-27 NOTE — Discharge Summary (Signed)
Physician Discharge Summary  Corey West CNO:[id number] DOB: January 16, 1951 DOA: 02/23/2020  PCP: System, Pcp Not In  Admit date: 02/23/2020 Discharge date: 02/27/2020  Discharge disposition: Skilled nursing facility   Recommendations for Outpatient Follow-Up:   Outpatient follow-up with physician at the nursing home within 3 days of discharge   Discharge Diagnosis:   Principal Problem:   Cellulitis of left leg Active Problems:   Gastroesophageal reflux disease   Hyperlipidemia   Iron deficiency anemia   Thrombocytopenia (HCC)   Edema   GAVE (gastric antral vascular ectasia)   Obesity, Class III, BMI 40-49.9 (morbid obesity) ([place])   Type 2 diabetes mellitus with diabetic nephropathy, with long-term current use of insulin (HCC)   Liver cirrhosis (HCC)   Anxiety   CKD stage 4 due to type 2 diabetes mellitus ([place])   Hyperlipidemia associated with type 2 diabetes mellitus ([place])   Contusion of ribs, left, initial encounter    Discharge Condition: Stable.  Diet recommendation: Low-salt diabetic diet  Code status: Full code.    Hospital Course:   Corey West is a 69 y.o. male malewith medical history significant formorbid obesity, chronic lymphedema and venous stasis,recurrent UTIs, type 2 diabetes, CKD-4, liver cirrhosis, GAVE with hx of GI bleeds, chronic iron deficiency anemia, GERD, likely OSA, chronic thrombocytopenia, HTN, HLD presented to the ED on 02/23/2020 with multiple complaints including increased weeping and swelling of his legs,left-sided rib and chest wall pain after a fall 2 or 3 days prior to admission.Due to the pain, patient has been unable to take care of himself.He takes lactulose for his cirrhosis, but he said he could not make it to the bathroom in time because of severe pain.    He was admitted to the hospital for left lower extremity cellulitis.  He was treated with empiric IV Rocephin.  He was also given analgesics for pain.  He was  evaluated by PT and OT who recommended further rehabilitation at a skilled nursing facility.  His pain is better.  Overall he feels better and he is deemed stable for discharge to SNF today.  Discharge plan was discussed with the patient and is agreeable with the plan.    Discharge Exam:   Vitals:   02/26/20 2304 02/27/20 0808  BP: (!) 144/55 (!) 125/46  Pulse: 71 69  Resp: 18 16  Temp: 98.3 F (36.8 C) 98.1 F (36.7 C)  SpO2: 96% 95%   Vitals:   02/26/20 0823 02/26/20 1628 02/26/20 2304 02/27/20 0808  BP: (!) 132/51 (!) 157/73 (!) 144/55 (!) 125/46  Pulse: 66 71 71 69  Resp: 18 16 18 16   Temp: 98.8 F (37.1 C) 99.4 F (37.4 C) 98.3 F (36.8 C) 98.1 F (36.7 C)  TempSrc: Oral Oral Oral Oral  SpO2: 91% 93% 96% 95%  Weight:      Height:         GEN: NAD SKIN: Chronic wounds on bilateral legs EYES: EOMI ENT: MMM CV: RRR PULM: CTA B ABD: soft, obese, NT, +BS CNS: AAO x 3, non focal EXT: Bilateral lower extremity edema with mild tenderness of bilateral legs.  Erythema of bilateral legs and left posteromedial thigh improving   The results of significant diagnostics from this hospitalization (including imaging, microbiology, ancillary and laboratory) are listed below for reference.     Procedures and Diagnostic Studies:   DG Chest 2 View  Result Date: 02/23/2020 CLINICAL DATA:  Pain following fall EXAM: CHEST - 2 VIEW COMPARISON:  Feb 03, 2020 FINDINGS: Lungs are clear. Heart is mildly enlarged with pulmonary vascularity normal. No adenopathy. No pneumothorax. No evident bone lesions. IMPRESSION: Mild cardiomegaly.  No edema or consolidation. Electronically Signed   By: Lowella Grip III M.D.   On: 02/23/2020 14:56   US Venous Img Lower Bilateral  Result Date: 02/23/2020 CLINICAL DATA:  [place] yesterday, bilateral leg pain and edema EXAM: BILATERAL LOWER EXTREMITY VENOUS DOPPLER ULTRASOUND TECHNIQUE: Gray-scale sonography with compression, as well as color and duplex  ultrasound, were performed to evaluate the deep venous system(s) from the level of the common femoral vein through the popliteal and proximal calf veins. COMPARISON:  02/03/2020 FINDINGS: VENOUS Normal compressibility of the common femoral, superficial femoral, and popliteal veins, as well as the visualized calf veins. Visualized portions of profunda femoral vein and great saphenous vein unremarkable. No filling defects to suggest DVT on grayscale or color Doppler imaging. Doppler waveforms show normal direction of venous flow, normal respiratory plasticity and response to augmentation. Limited views of the contralateral common femoral vein are unremarkable. OTHER None. Limitations: none IMPRESSION: Negative. Electronically Signed   By: Randa Ngo M.D.   On: 02/23/2020 17:21   DG Femur Min 2 Views Left  Result Date: 02/23/2020 CLINICAL DATA:  Pain following fall EXAM: LEFT FEMUR 2 VIEWS COMPARISON:  None. FINDINGS: Frontal and lateral views obtained. No fracture or dislocation. No abnormal periosteal reaction. There is slight narrowing of the left hip joint. No knee joint effusion. There are multiple foci of arterial vascular calcification. IMPRESSION: No fracture or dislocation. Mild narrowing left hip joint. Multiple foci of arterial vascular calcification. Electronically Signed   By: Lowella Grip III M.D.   On: 02/23/2020 14:57     Labs:   Basic Metabolic Panel: Recent Labs  Lab 02/23/20 1403 02/23/20 1403 02/24/20 0433 02/24/20 0433 02/25/20 0857 02/26/20 0931  NA 136  --  137  --  132* 135  K 3.5   < > 3.6   < > 3.8 3.6  CL 100  --  100  --  95* 96*  CO2 28  --  28  --  27 29  GLUCOSE 245*  --  210*  --  193* 176*  BUN 53*  --  52*  --  54* 57*  CREATININE 2.65*  --  2.98*  --  3.10* 3.11*  CALCIUM 8.7*  --  8.7*  --  8.4* 8.8*  MG  --   --  1.6*  --   --   --    < > = values in this interval not displayed.   GFR Estimated Creatinine Clearance: 30.7 mL/min (A) (by C-G  formula based on SCr of 3.11 mg/dL (H)). Liver Function Tests: Recent Labs  Lab 02/23/20 1403 02/24/20 0433  AST 30 22  ALT 24 21  ALKPHOS 74 70  BILITOT 1.1 1.0  PROT 7.0 6.6  ALBUMIN 3.3* 3.1*   No results for input(s): LIPASE, AMYLASE in the last 168 hours. No results for input(s): AMMONIA in the last 168 hours. Coagulation profile Recent Labs  Lab 02/24/20 0433  INR 1.5*    CBC: Recent Labs  Lab 02/23/20 1403 02/24/20 0433 02/25/20 0857 02/26/20 0931  WBC 5.8 3.8* 4.1 3.6*  NEUTROABS 4.9  --  3.2 2.9  HGB 7.5* 7.2* 7.5* 8.1*  HCT 23.6* 22.6* 23.8* 24.5*  MCV 91.1 93.0 91.5 87.2  PLT 80* 75* 83* 80*   Cardiac Enzymes: No results for input(s): CKTOTAL, CKMB, CKMBINDEX,  TROPONINI in the last 168 hours. BNP: Invalid input(s): POCBNP CBG: Recent Labs  Lab 02/26/20 1146 02/26/20 1629 02/26/20 2048 02/27/20 0806 02/27/20 1123  GLUCAP 178* 199* 252* 145* 170*   D-Dimer No results for input(s): DDIMER in the last 72 hours. Hgb A1c No results for input(s): HGBA1C in the last 72 hours. Lipid Profile No results for input(s): CHOL, HDL, LDLCALC, TRIG, CHOLHDL, LDLDIRECT in the last 72 hours. Thyroid function studies No results for input(s): TSH, T4TOTAL, T3FREE, THYROIDAB in the last 72 hours.  Invalid input(s): FREET3 Anemia work up No results for input(s): VITAMINB12, FOLATE, FERRITIN, TIBC, IRON, RETICCTPCT in the last 72 hours. Microbiology Recent Results (from the past 240 hour(s))  Blood culture (routine x 2)     Status: None (Preliminary result)   Collection Time: 02/23/20  2:58 PM   Specimen: BLOOD  Result Value Ref Range Status   Specimen Description BLOOD BLOOD LEFT HAND  Final   Special Requests   Final    BOTTLES DRAWN AEROBIC AND ANAEROBIC Blood Culture adequate volume   Culture   Final    NO GROWTH 4 DAYS Performed at [facility], [address], [place], [place] [address]    Report Status PENDING  Incomplete  Blood culture  (routine x 2)     Status: None (Preliminary result)   Collection Time: 02/23/20  3:03 PM   Specimen: BLOOD  Result Value Ref Range Status   Specimen Description BLOOD RIGHT ANTECUBITAL  Final   Special Requests   Final    BOTTLES DRAWN AEROBIC AND ANAEROBIC Blood Culture adequate volume   Culture   Final    NO GROWTH 4 DAYS Performed at [facility], [address] [place], [place] [address]    Report Status PENDING  Incomplete  SARS Coronavirus 2 by RT PCR (hospital order, performed in [place] hospital lab) Nasopharyngeal Nasopharyngeal Swab     Status: None   Collection Time: 02/23/20  3:04 PM   Specimen: Nasopharyngeal Swab  Result Value Ref Range Status   SARS Coronavirus 2 NEGATIVE NEGATIVE Final    Comment: (NOTE) SARS-CoV-2 target nucleic acids are NOT DETECTED. The SARS-CoV-2 RNA is generally detectable in upper and lower respiratory specimens during the acute phase of infection. The lowest concentration of SARS-CoV-2 viral copies this assay can detect is 250 copies / mL. A negative result does not preclude SARS-CoV-2 infection and should not be used as the sole basis for treatment or other patient management decisions.  A negative result may occur with improper specimen collection / handling, submission of specimen other than nasopharyngeal swab, presence of viral mutation(s) within the areas targeted by this assay, and inadequate number of viral copies (<250 copies / mL). A negative result must be combined with clinical observations, patient history, and epidemiological information. Fact Sheet for Patients:   StrictlyIdeas.no Fact Sheet for Healthcare Providers: BankingDealers.co.za This test is not yet approved or cleared  by the [place] FDA and has been authorized for detection and/or diagnosis of SARS-CoV-2 by FDA under an Emergency Use Authorization (EUA).  This EUA will remain in effect (meaning  this test can be used) for the duration of the COVID-19 declaration under Section 564(b)(1) of the Act, 21 U.S.C. section 360bbb-3(b)(1), unless the authorization is terminated or revoked sooner. Performed at [facility], [address] [place], [place] [address]   SARS Coronavirus 2 by RT PCR (hospital order, performed in [place] lab) Nasopharyngeal Nasopharyngeal Swab  Status: None   Collection Time: 02/26/20  4:59 PM   Specimen: Nasopharyngeal Swab  Result Value Ref Range Status   SARS Coronavirus 2 NEGATIVE NEGATIVE Final    Comment: (NOTE) SARS-CoV-2 target nucleic acids are NOT DETECTED. The SARS-CoV-2 RNA is generally detectable in upper and lower respiratory specimens during the acute phase of infection. The lowest concentration of SARS-CoV-2 viral copies this assay can detect is 250 copies / mL. A negative result does not preclude SARS-CoV-2 infection and should not be used as the sole basis for treatment or other patient management decisions.  A negative result may occur with improper specimen collection / handling, submission of specimen other than nasopharyngeal swab, presence of viral mutation(s) within the areas targeted by this assay, and inadequate number of viral copies (<250 copies / mL). A negative result must be combined with clinical observations, patient history, and epidemiological information. Fact Sheet for Patients:   StrictlyIdeas.no Fact Sheet for Healthcare Providers: BankingDealers.co.za This test is not yet approved or cleared  by the [place] FDA and has been authorized for detection and/or diagnosis of SARS-CoV-2 by FDA under an Emergency Use Authorization (EUA).  This EUA will remain in effect (meaning this test can be used) for the duration of the COVID-19 declaration under Section 564(b)(1) of the Act, 21 U.S.C. section 360bbb-3(b)(1), unless the authorization is  terminated or revoked sooner. Performed at [facility], [address] [place], [place] [address]      Discharge Instructions:   Discharge Instructions    Diet - low sodium heart healthy   Complete by: As directed    Diet Carb Modified   Complete by: As directed    Discharge instructions   Complete by: As directed    Follow up with physician at nursing home within 3 days of discharge   Increase activity slowly   Complete by: As directed      Allergies as of 02/27/2020   No Known Allergies     Medication List    TAKE these medications   amLODipine 5 MG tablet Commonly known as: NORVASC Take 1 tablet (5 mg total) by mouth daily.   atorvastatin 10 MG tablet Commonly known as: LIPITOR Take 2 tablets (20 mg total) by mouth daily at 6 PM.   clindamycin 300 MG capsule Commonly known as: CLEOCIN Take 1 capsule (300 mg total) by mouth 3 (three) times daily for 3 days.   Combivent Respimat 20-100 MCG/ACT Aers respimat Generic drug: Ipratropium-Albuterol Inhale 1 puff into the lungs every 6 (six) hours.   feeding supplement (ENSURE ENLIVE) Liqd Take 237 mLs by mouth 2 (two) times daily between meals.   hydrALAZINE 25 MG tablet Commonly known as: APRESOLINE Take 1 tablet (25 mg total) by mouth in the morning and at bedtime.   insulin aspart 100 UNIT/ML injection Commonly known as: novoLOG Inject 4 Units into the skin 3 (three) times daily with meals.   insulin detemir 100 UNIT/ML injection Commonly known as: LEVEMIR Inject 0.08 mLs (8 Units total) into the skin daily.   iron polysaccharides 150 MG capsule Commonly known as: NIFEREX Take 1 capsule (150 mg total) by mouth daily.   lactulose 10 GM/15ML solution Commonly known as: CHRONULAC Take 45 mLs (30 g total) by mouth 2 (two) times daily.   LORazepam 0.5 MG tablet Commonly known as: ATIVAN Take 1 tablet (0.5 mg total) by mouth every 8 (eight) hours as needed for anxiety.   multivitamin tablet  Take 1 tablet by  mouth daily.   pantoprazole 40 MG tablet Commonly known as: Protonix Take 1 tablet (40 mg total) by mouth 2 (two) times daily.   rifaximin 550 MG Tabs tablet Commonly known as: XIFAXAN Take 1 tablet (550 mg total) by mouth 2 (two) times daily.   sodium chloride 0.65 % Soln nasal spray Commonly known as: OCEAN Place 1 spray into both nostrils as needed for congestion (nose irritation).   spironolactone 25 MG tablet Commonly known as: ALDACTONE Take 1 tablet (25 mg total) by mouth daily.   torsemide 20 MG tablet Commonly known as: DEMADEX Take 2 tablets (40 mg total) by mouth daily.   traMADol 50 MG tablet Commonly known as: ULTRAM Take 1 tablet (50 mg total) by mouth every 8 (eight) hours as needed for moderate pain. What changed: when to take this       Contact information for follow-up providers    [facility] Follow up on 03/03/2020.   Specialty: Cardiology Why: at 10:00am. Enter through the [place] entrance Contact information: [place] [phone]           Contact information for after-discharge care    Destination    [facility] Preferred SNF .   Service: Skilled Nursing Contact information: [address] [place] [phone]                   Time coordinating discharge: 32 minutes  Signed:  [facility] 02/27/2020, 12:15 PM

## 2020-02-28 LAB — CULTURE, BLOOD (ROUTINE X 2)
Culture: NO GROWTH
Culture: NO GROWTH
Special Requests: ADEQUATE
Special Requests: ADEQUATE

## 2020-02-28 LAB — GLUCOSE, CAPILLARY: Glucose-Capillary: 176 mg/dL — ABNORMAL HIGH (ref 70–99)

## 2020-03-01 ENCOUNTER — Telehealth: Payer: Self-pay | Admitting: Family

## 2020-03-01 NOTE — Telephone Encounter (Signed)
Unable to reach patient about his new patient CHF Clinic appointment for 6/3.   Alyse Low, [place]

## 2020-03-01 NOTE — Telephone Encounter (Signed)
Confirmed patients new patient CHF Clinic appointment with [facility] for 6/3.    Alyse Low, [place]

## 2020-03-01 NOTE — Progress Notes (Addendum)
Patient ID: Corey West, male    DOB: 1951/06/07, 69 y.o.   MRN: [id number]  HPI  Corey West is a 69 y/o male with a history of DM, hyperlipidemia, HTN, CKD, cirrhosis, GERD, arthritis, depression, anxiety, sleep apnea, previous tobacco use and chronic heart failure.   Echo report from 12/16/19 reviewed and showed an EF of 60-65% along with moderate LVH.  Admitted 02/23/20 due to left lower leg cellulitis. Wound care consult done. Given IV antibiotics. PT/OT consults obtained. Discharged after 4 days to SNF. Admitted 01/28/20 due to acute on chronic anemia and HF. GI, cardiology, nephrology and wound consults obtained. Given IV iron. Needed to stay on oxygen at night. Was on lasix gtt and then transitioned to oral diuretics. No beta-blocker due to bradycardia. Discharged after 5 days.  He presents today for his initial visit with a chief complaint of minimal shortness of breath upon moderate exertion. He describes this as chronic in nature having been present for several years. He has associated fatigue, cough, pedal edema, occasional palpitations, light-headedness (with sudden position changes), scrotal swelling (improving) and rib pain from previous fracture along with this. He denies any difficulty sleeping, abdominal distention, chest pain or wheezing.   Currently at rehab for PT and is hoping to go home soon.   Past Medical History:  Diagnosis Date  . Anemia   . Anxiety    controlled;   . Arthritis   . AVM (arteriovenous malformation) of stomach, acquired with hemorrhage   . Barrett's esophagus   . CHF (congestive heart failure) ([place])   . Chronic kidney disease    renal infufficiency  . Cirrhosis ([place])   . Depression    controlled;   Marland Kitchen Diabetes mellitus without complication ([place])    not controlled, taking insulin but sugar continues to run high;   . Edema   . Esophageal varices ([place])   . GAVE (gastric antral vascular ectasia)   . GERD (gastroesophageal reflux disease)   . History of  hiatal hernia   . Hyperlipidemia   . Hypertension    controlled well;   . Liver cirrhosis ([place])   . Nephropathy, diabetic ([place])   . Obesity   . Pancytopenia ([place])   . Polyp, stomach    with chronic blood loss  . Sleep apnea    does not wear a cpap, Medicare would not pay for it   . Venous stasis dermatitis of both lower extremities   . Venous stasis of both lower extremities    with cellulitis   Past Surgical History:  Procedure Laterality Date  . ESOPHAGOGASTRODUODENOSCOPY N/A 02/09/2015   Procedure: ESOPHAGOGASTRODUODENOSCOPY (EGD);  Surgeon: Manya Silvas, MD;  Location: [facility] ENDOSCOPY;  Service: Endoscopy;  Laterality: N/A;  . ESOPHAGOGASTRODUODENOSCOPY N/A 07/22/2015   Procedure: ESOPHAGOGASTRODUODENOSCOPY (EGD);  Surgeon: Manya Silvas, MD;  Location: [facility] ENDOSCOPY;  Service: Endoscopy;  Laterality: N/A;  . ESOPHAGOGASTRODUODENOSCOPY N/A 06/26/2017   Procedure: ESOPHAGOGASTRODUODENOSCOPY (EGD);  Surgeon: Manya Silvas, MD;  Location: [facility] ENDOSCOPY;  Service: Endoscopy;  Laterality: N/A;  . ESOPHAGOGASTRODUODENOSCOPY N/A 06/27/2017   Procedure: ESOPHAGOGASTRODUODENOSCOPY (EGD);  Surgeon: Manya Silvas, MD;  Location: [facility] ENDOSCOPY;  Service: Endoscopy;  Laterality: N/A;  . ESOPHAGOGASTRODUODENOSCOPY N/A 06/28/2017   Procedure: ESOPHAGOGASTRODUODENOSCOPY (EGD);  Surgeon: Manya Silvas, MD;  Location: [facility] ENDOSCOPY;  Service: Endoscopy;  Laterality: N/A;  . ESOPHAGOGASTRODUODENOSCOPY Bilateral 10/11/2017   Procedure: ESOPHAGOGASTRODUODENOSCOPY (EGD);  Surgeon: Manya Silvas, MD;  Location: [facility] ENDOSCOPY;  Service: Endoscopy;  Laterality: Bilateral;  .  ESOPHAGOGASTRODUODENOSCOPY N/A 12/04/2017   Procedure: ESOPHAGOGASTRODUODENOSCOPY (EGD);  Surgeon: Manya Silvas, MD;  Location: [facility] ENDOSCOPY;  Service: Endoscopy;  Laterality: N/A;  . ESOPHAGOGASTRODUODENOSCOPY N/A 03/06/2019   Procedure: ESOPHAGOGASTRODUODENOSCOPY (EGD);  Surgeon: Toledo, Benay Pike, MD;   Location: [facility] ENDOSCOPY;  Service: Gastroenterology;  Laterality: N/A;  . ESOPHAGOGASTRODUODENOSCOPY (EGD) WITH PROPOFOL N/A 07/20/2015   Procedure: ESOPHAGOGASTRODUODENOSCOPY (EGD) WITH PROPOFOL;  Surgeon: Manya Silvas, MD;  Location: [facility] ENDOSCOPY;  Service: Endoscopy;  Laterality: N/A;  . ESOPHAGOGASTRODUODENOSCOPY (EGD) WITH PROPOFOL N/A 09/16/2015   Procedure: ESOPHAGOGASTRODUODENOSCOPY (EGD) WITH PROPOFOL;  Surgeon: Manya Silvas, MD;  Location: [facility] ENDOSCOPY;  Service: Endoscopy;  Laterality: N/A;  . ESOPHAGOGASTRODUODENOSCOPY (EGD) WITH PROPOFOL N/A 03/16/2016   Procedure: ESOPHAGOGASTRODUODENOSCOPY (EGD) WITH PROPOFOL;  Surgeon: Manya Silvas, MD;  Location: [facility] ENDOSCOPY;  Service: Endoscopy;  Laterality: N/A;  . ESOPHAGOGASTRODUODENOSCOPY (EGD) WITH PROPOFOL N/A 09/14/2016   Procedure: ESOPHAGOGASTRODUODENOSCOPY (EGD) WITH PROPOFOL;  Surgeon: Manya Silvas, MD;  Location: [facility] ENDOSCOPY;  Service: Endoscopy;  Laterality: N/A;  . ESOPHAGOGASTRODUODENOSCOPY (EGD) WITH PROPOFOL N/A 11/05/2016   Procedure: ESOPHAGOGASTRODUODENOSCOPY (EGD) WITH PROPOFOL;  Surgeon: Manya Silvas, MD;  Location: [facility] ENDOSCOPY;  Service: Endoscopy;  Laterality: N/A;  . ESOPHAGOGASTRODUODENOSCOPY (EGD) WITH PROPOFOL N/A 02/06/2017   Procedure: ESOPHAGOGASTRODUODENOSCOPY (EGD) WITH PROPOFOL;  Surgeon: Manya Silvas, MD;  Location: [facility] ENDOSCOPY;  Service: Endoscopy;  Laterality: N/A;  . ESOPHAGOGASTRODUODENOSCOPY (EGD) WITH PROPOFOL N/A 04/24/2017   Procedure: ESOPHAGOGASTRODUODENOSCOPY (EGD) WITH PROPOFOL;  Surgeon: Manya Silvas, MD;  Location: [facility] ENDOSCOPY;  Service: Endoscopy;  Laterality: N/A;  . ESOPHAGOGASTRODUODENOSCOPY (EGD) WITH PROPOFOL N/A 07/31/2017   Procedure: ESOPHAGOGASTRODUODENOSCOPY (EGD) WITH PROPOFOL;  Surgeon: Manya Silvas, MD;  Location: [facility] ENDOSCOPY;  Service: Endoscopy;  Laterality: N/A;  . ESOPHAGOGASTRODUODENOSCOPY (EGD) WITH PROPOFOL N/A 08/08/2018    Procedure: ESOPHAGOGASTRODUODENOSCOPY (EGD) WITH PROPOFOL;  Surgeon: Manya Silvas, MD;  Location: [facility] ENDOSCOPY;  Service: Endoscopy;  Laterality: N/A;  . GIVENS CAPSULE STUDY N/A 12/05/2017   Procedure: GIVENS CAPSULE STUDY;  Surgeon: Manya Silvas, MD;  Location: [facility] ENDOSCOPY;  Service: Endoscopy;  Laterality: N/A;  . TONSILLECTOMY    . TONSILLECTOMY AND ADENOIDECTOMY    . ULNAR NERVE TRANSPOSITION    . UVULOPALATOPHARYNGOPLASTY     Family History  Problem Relation Age of Onset  . Diabetes Other   . Transient ischemic attack Father   . CAD Father    Social History   Tobacco Use  . Smoking status: Former Smoker    Years: 20.00    Types: Cigars, Cigarettes  . Smokeless tobacco: Never Used  Substance Use Topics  . Alcohol use: No    Comment: stopped 15 years ago   No Known Allergies Prior to Admission medications   Medication Sig Start Date End Date Taking? Authorizing Provider  amLODipine (NORVASC) 5 MG tablet Take 1 tablet (5 mg total) by mouth daily. 02/13/20  Yes Wieting, Richard, MD  atorvastatin (LIPITOR) 10 MG tablet Take 2 tablets (20 mg total) by mouth daily at 6 PM. 12/17/19  Yes Amery, Gwynneth Albright, MD  feeding supplement, ENSURE ENLIVE, (ENSURE ENLIVE) LIQD Take 237 mLs by mouth 2 (two) times daily between meals. 02/12/20  Yes Wieting, Richard, MD  hydrALAZINE (APRESOLINE) 25 MG tablet Take 1 tablet (25 mg total) by mouth in the morning and at bedtime. 02/27/20  Yes Jennye Boroughs, MD  insulin aspart (NOVOLOG) 100 UNIT/ML injection Inject 4 Units into the skin 3 (three) times daily with meals. 02/12/20  Yes Loletha Grayer, MD  insulin detemir (LEVEMIR) 100 UNIT/ML injection Inject 0.08 mLs (8 Units total) into the skin daily. 02/12/20  Yes Wieting, Richard, MD  Ipratropium-Albuterol (COMBIVENT RESPIMAT) 20-100 MCG/ACT AERS respimat Inhale 1 puff into the lungs every 6 (six) hours. 02/12/20  Yes Wieting, Richard, MD  iron polysaccharides (NIFEREX) 150 MG capsule Take 1  capsule (150 mg total) by mouth daily. 02/13/20  Yes Wieting, Richard, MD  lactulose (CHRONULAC) 10 GM/15ML solution Take 45 mLs (30 g total) by mouth 2 (two) times daily. 02/12/20  Yes Wieting, Richard, MD  Multiple Vitamin (MULTIVITAMIN) tablet Take 1 tablet by mouth daily.   Yes [provider]  pantoprazole (PROTONIX) 40 MG tablet Take 1 tablet (40 mg total) by mouth 2 (two) times daily. 03/06/19 03/05/20 Yes Dustin Flock, MD  rifaximin (XIFAXAN) 550 MG TABS tablet Take 1 tablet (550 mg total) by mouth 2 (two) times daily. 02/12/20  Yes Wieting, Richard, MD  sodium chloride (OCEAN) 0.65 % SOLN nasal spray Place 1 spray into both nostrils as needed for congestion (nose irritation). 02/12/20  Yes Wieting, Richard, MD  spironolactone (ALDACTONE) 25 MG tablet Take 1 tablet (25 mg total) by mouth daily. 02/13/20  Yes Wieting, Richard, MD  torsemide (DEMADEX) 20 MG tablet Take 2 tablets (40 mg total) by mouth daily. 02/13/20  Yes Wieting, Richard, MD  traMADol (ULTRAM) 50 MG tablet Take 1 tablet (50 mg total) by mouth every 8 (eight) hours as needed for moderate pain. 02/27/20  Yes Jennye Boroughs, MD    Review of Systems  Constitutional: Positive for fatigue. Negative for appetite change (decreased).  HENT: Positive for congestion. Negative for postnasal drip and sore throat.   Eyes: Negative.   Respiratory: Positive for cough and shortness of breath. Negative for wheezing.   Cardiovascular: Positive for palpitations and leg swelling (lower legs wrapped in ACE wraps). Negative for chest pain.  Gastrointestinal: Negative for abdominal distention and abdominal pain.  Endocrine: Negative.   Genitourinary: Positive for scrotal swelling.  Musculoskeletal: Positive for arthralgias (left sided rib pain). Negative for back pain.  Skin: Negative.   Allergic/Immunologic: Negative.   Neurological: Positive for light-headedness (with sudden position changes). Negative for dizziness.  Hematological:  Negative for adenopathy. Does not bruise/bleed easily.  Psychiatric/Behavioral: Negative for dysphoric mood and sleep disturbance (oxygen at night sometimes). The patient is not nervous/anxious.     Vitals:   03/03/20 1016  BP: (!) 147/91  Pulse: 76  Resp: 16  SpO2: 100%  Weight: 288 lb 8 oz (130.9 kg)  Height: 5\' 9"  (1.753 m)   Wt Readings from Last 3 Encounters:  03/03/20 288 lb 8 oz (130.9 kg)  02/23/20 292 lb 8.8 oz (132.7 kg)  02/12/20 299 lb 6.4 oz (135.8 kg)   Lab Results  Component Value Date   CREATININE 3.11 (H) 02/26/2020   CREATININE 3.10 (H) 02/25/2020   CREATININE 2.98 (H) 02/24/2020    Physical Exam Vitals and nursing note reviewed.  Constitutional:      Appearance: He is well-developed.  HENT:     Head: Normocephalic and atraumatic.  Neck:     Vascular: No JVD.  Cardiovascular:     Rate and Rhythm: Normal rate and regular rhythm.  Pulmonary:     Effort: Pulmonary effort is normal. No respiratory distress.     Breath sounds: No wheezing or rales.  Abdominal:     Palpations: Abdomen is soft.     Tenderness: There is no abdominal tenderness.  Musculoskeletal:     Cervical  back: Normal range of motion and neck supple.     Right lower leg: No tenderness. Edema (wrapped in ACE wrap) present.     Left lower leg: No tenderness. Edema (wrapped in ACE wrap) present.  Skin:    General: Skin is warm and dry.  Neurological:     General: No focal deficit present.     Mental Status: He is alert and oriented to person, place, and time.  Psychiatric:        Mood and Affect: Mood normal.        Behavior: Behavior normal.     Assessment & Plan:  1: Chronic heart failure with preserved ejection fraction with structural changes- - NYHA class II - euvolemic today - order written for him to be weighed daily and to call for an overnight weight gain of >2 pounds or a weekly weight gain of >5 pounds - not adding salt to his food but he's unsure of how much sodium  he's currently getting since he's at rehab - doubt renal function could tolerate entresto; will defer this to nephrology - currently getting PT and hoping to go home within the next week or so - currently has both legs wrapped in ACE bandages; tries to elevate his legs when sitting for long periods of time - discussed paramedicine program with patient and he is interested; brochure given and explained that she would start seeing him once he leaves rehab; referral placed - BNP 02/23/20 was 213.9 - PharmD reconciled facility medication  2: HTN- - BP mildly elevated but yesterday at PCP's office it was fine - saw PCP Ola Spurr) 03/02/20 & returns in 2 weeks - BMP 03/02/20 reviewed and showed sodium 137, potassium 3.4, creatinine 2.3 and GFR 28 - seeing nephrology Candiss Norse) 03/07/20  3: DM- - A1c 02/23/20 was 6.9% - nonfasting glucose in clinic today was [place] medication list was reviewed.   Return here in 1 month or sooner for any questions/problems before then.

## 2020-03-03 ENCOUNTER — Ambulatory Visit: Payer: No Typology Code available for payment source | Attending: Family | Admitting: Family

## 2020-03-03 ENCOUNTER — Other Ambulatory Visit: Payer: Self-pay

## 2020-03-03 ENCOUNTER — Encounter: Payer: Self-pay | Admitting: Family

## 2020-03-03 VITALS — BP 147/91 | HR 76 | Resp 16 | Ht 69.0 in | Wt 288.5 lb

## 2020-03-03 DIAGNOSIS — R0602 Shortness of breath: Secondary | ICD-10-CM | POA: Insufficient documentation

## 2020-03-03 DIAGNOSIS — I130 Hypertensive heart and chronic kidney disease with heart failure and stage 1 through stage 4 chronic kidney disease, or unspecified chronic kidney disease: Secondary | ICD-10-CM | POA: Insufficient documentation

## 2020-03-03 DIAGNOSIS — N189 Chronic kidney disease, unspecified: Secondary | ICD-10-CM | POA: Diagnosis not present

## 2020-03-03 DIAGNOSIS — Z87891 Personal history of nicotine dependence: Secondary | ICD-10-CM | POA: Diagnosis not present

## 2020-03-03 DIAGNOSIS — R5383 Other fatigue: Secondary | ICD-10-CM | POA: Diagnosis not present

## 2020-03-03 DIAGNOSIS — I10 Essential (primary) hypertension: Secondary | ICD-10-CM

## 2020-03-03 DIAGNOSIS — E1122 Type 2 diabetes mellitus with diabetic chronic kidney disease: Secondary | ICD-10-CM | POA: Insufficient documentation

## 2020-03-03 DIAGNOSIS — R002 Palpitations: Secondary | ICD-10-CM | POA: Insufficient documentation

## 2020-03-03 DIAGNOSIS — Z794 Long term (current) use of insulin: Secondary | ICD-10-CM | POA: Diagnosis not present

## 2020-03-03 DIAGNOSIS — R05 Cough: Secondary | ICD-10-CM | POA: Insufficient documentation

## 2020-03-03 DIAGNOSIS — K219 Gastro-esophageal reflux disease without esophagitis: Secondary | ICD-10-CM | POA: Diagnosis not present

## 2020-03-03 DIAGNOSIS — Z8719 Personal history of other diseases of the digestive system: Secondary | ICD-10-CM | POA: Insufficient documentation

## 2020-03-03 DIAGNOSIS — Z79899 Other long term (current) drug therapy: Secondary | ICD-10-CM | POA: Diagnosis not present

## 2020-03-03 DIAGNOSIS — M1990 Unspecified osteoarthritis, unspecified site: Secondary | ICD-10-CM | POA: Diagnosis not present

## 2020-03-03 DIAGNOSIS — E785 Hyperlipidemia, unspecified: Secondary | ICD-10-CM | POA: Diagnosis not present

## 2020-03-03 DIAGNOSIS — K7460 Unspecified cirrhosis of liver: Secondary | ICD-10-CM | POA: Diagnosis not present

## 2020-03-03 DIAGNOSIS — R42 Dizziness and giddiness: Secondary | ICD-10-CM | POA: Insufficient documentation

## 2020-03-03 DIAGNOSIS — E669 Obesity, unspecified: Secondary | ICD-10-CM | POA: Diagnosis not present

## 2020-03-03 DIAGNOSIS — I5032 Chronic diastolic (congestive) heart failure: Secondary | ICD-10-CM | POA: Insufficient documentation

## 2020-03-03 LAB — GLUCOSE, CAPILLARY: Glucose-Capillary: 274 mg/dL — ABNORMAL HIGH (ref 70–99)

## 2020-03-03 NOTE — Progress Notes (Signed)
[place] - PHARMACIST COUNSELING NOTE  ADHERENCE ASSESSMENT  Adherence strategy: Patient is currently residing at [facility]. Facility responsible for administering medications.    Do you ever forget to take your medication? [] Yes (1) [x] No (0)  Do you ever skip doses due to side effects? [] Yes (1) [x] No (0)  Do you have trouble affording your medicines? [] Yes (1) [x] No (0)  Are you ever unable to pick up your medication due to transportation difficulties? [] Yes (1) [x] No (0)  Do you ever stop taking your medications because you don't believe they are helping? [] Yes (1) [x] No (0)  Total score 0   Recommendations given to patient about increasing adherence: None needed at this time. Will need to re-assess if / when patient returns to community living.  Guideline-Directed Medical Therapy/Evidence Based Medicine  ACE/ARB/ARNI: None Beta Blocker: None Aldosterone Antagonist: Spironolactone 25 mg daily Diuretic: Torsemide 40 mg daily    SUBJECTIVE  HPI: Patient is a 69 y/o M with PMH as below who presents to HF clinic for new patient visit. He was most recently hospitalized 5/25 - 5/29 with left lower extremity cellulitis and discharged to SNF.   Past Medical History:  Diagnosis Date  . Anemia   . Anxiety    controlled;   . Arthritis   . AVM (arteriovenous malformation) of stomach, acquired with hemorrhage   . Barrett's esophagus   . Chronic kidney disease    renal infufficiency  . Cirrhosis ([place])   . Depression    controlled;   Marland Kitchen Diabetes mellitus without complication ([place])    not controlled, taking insulin but sugar continues to run high;   . Edema   . Esophageal varices ([place])   . GAVE (gastric antral vascular ectasia)   . GERD (gastroesophageal reflux disease)   . History of hiatal hernia   . Hyperlipidemia   . Hypertension    controlled well;   . Liver cirrhosis ([place])   . Nephropathy, diabetic ([place])   . Obesity    . Pancytopenia ([place])   . Polyp, stomach    with chronic blood loss  . Sleep apnea    does not wear a cpap, Medicare would not pay for it   . Venous stasis dermatitis of both lower extremities   . Venous stasis of both lower extremities    with cellulitis      OBJECTIVE   Vital signs: HR 76, BP 147/91, weight (pounds) 288  ECHO: Date 12/16/19, EF 60-65%, notes: Moderate LVH, grade I diastolic dysfunction. RV mildly enlarged. LA mildly dilated. Trivial MR, mild TR, trivial AR. Mild-moderate AV sclerosis/calcification without evidence of stenosis.    BMP Latest Ref Rng & Units 02/26/2020 02/25/2020 02/24/2020  Glucose 70 - 99 mg/dL 176(H) 193(H) 210(H)  BUN 8 - 23 mg/dL 57(H) 54(H) 52(H)  Creatinine 0.61 - 1.24 mg/dL 3.11(H) 3.10(H) 2.98(H)  Sodium 135 - 145 mmol/L 135 132(L) 137  Potassium 3.5 - 5.1 mmol/L 3.6 3.8 3.6  Chloride 98 - 111 mmol/L 96(L) 95(L) 100  CO2 22 - 32 mmol/L 29 27 28   Calcium 8.9 - 10.3 mg/dL 8.8(L) 8.4(L) 8.7(L)    ASSESSMENT  Patient is well appearing and in no acute distress. He reports he is not happy with care at facility and is leaving next week. Reports scrotal and abdominal swelling.  Weights daily at facility but this information is not present with paperwork. He endorses his BG was in the 400s yesterday evening after eating chocolate  cake.   PLAN  1). Diastolic HF -Fluid management with torsemide 40 mg daily + spironolactone 25 mg daily -Continue low sodium diet -Continue daily weights ; call parameters discussed -Discussed with provider - no changes to medication regimen today  2). Hypertension -Hypertensive today in clinic -Antihypertensive regimen includes amlodipine 5 mg daily, hydralazine 25 mg BID, torsemide 40 mg daily, spironolactone 25 mg daily  3). Cirrhosis -Child-Pugh Class B -Follows with GI -Lactulose 30 g BID , rifaximin 550 mg BID -Hx of esophageal varices not on beta blocker  4). CKD stage IV -Reports upcoming visit with  nephrology -03/02/20 Scr 2.3 , GFR 28 , K 3.4 -01/30/20 Phos 3.2, iPTH 48  5). T2DM -POC BG 274 -Last HgbA1c was 6.9% on 02/23/20 -Antihyperglycemic regimen includes Novolog 4 units TIDAC + Levemir 8 units daily -Glucose checks 4x/day at facility  6). Barrett's esophagus -Pantoprazole 40 mg BID  7). Anemia -Niferex 150 mg daily -Anemia panel 01/30/20 with saturation ratio 14, ferritin 21, folate 16.4, vitamin B12 795  8). Hyperlipidemia  -Atorvastatin 20 mg daily -Lipid panel 12/15/19 with HDL 37, LDL 93, TG 56   Time spent: 25 minutes  [place] Resident 03/03/2020 9:25 AM    Current Outpatient Medications:  .  amLODipine (NORVASC) 5 MG tablet, Take 1 tablet (5 mg total) by mouth daily., Disp: 30 tablet, Rfl: 0 .  atorvastatin (LIPITOR) 10 MG tablet, Take 2 tablets (20 mg total) by mouth daily at 6 PM., Disp: 30 tablet, Rfl: 0 .  feeding supplement, ENSURE ENLIVE, (ENSURE ENLIVE) LIQD, Take 237 mLs by mouth 2 (two) times daily between meals., Disp: 14400 mL, Rfl: 0 .  hydrALAZINE (APRESOLINE) 25 MG tablet, Take 1 tablet (25 mg total) by mouth in the morning and at bedtime., Disp: , Rfl:  .  insulin aspart (NOVOLOG) 100 UNIT/ML injection, Inject 4 Units into the skin 3 (three) times daily with meals., Disp: 10 mL, Rfl: 0 .  insulin detemir (LEVEMIR) 100 UNIT/ML injection, Inject 0.08 mLs (8 Units total) into the skin daily., Disp: 10 mL, Rfl: 0 .  Ipratropium-Albuterol (COMBIVENT RESPIMAT) 20-100 MCG/ACT AERS respimat, Inhale 1 puff into the lungs every 6 (six) hours., Disp: 4 g, Rfl: 0 .  iron polysaccharides (NIFEREX) 150 MG capsule, Take 1 capsule (150 mg total) by mouth daily., Disp: 30 capsule, Rfl: 0 .  lactulose (CHRONULAC) 10 GM/15ML solution, Take 45 mLs (30 g total) by mouth 2 (two) times daily., Disp: 1892 mL, Rfl: 0 .  LORazepam (ATIVAN) 0.5 MG tablet, Take 1 tablet (0.5 mg total) by mouth every 8 (eight) hours as needed for anxiety., Disp: 10 tablet, Rfl:  0 .  Multiple Vitamin (MULTIVITAMIN) tablet, Take 1 tablet by mouth daily., Disp: , Rfl:  .  pantoprazole (PROTONIX) 40 MG tablet, Take 1 tablet (40 mg total) by mouth 2 (two) times daily., Disp: 60 tablet, Rfl: 11 .  rifaximin (XIFAXAN) 550 MG TABS tablet, Take 1 tablet (550 mg total) by mouth 2 (two) times daily., Disp: 60 tablet, Rfl: 0 .  sodium chloride (OCEAN) 0.65 % SOLN nasal spray, Place 1 spray into both nostrils as needed for congestion (nose irritation)., Disp: 60 mL, Rfl: 0 .  spironolactone (ALDACTONE) 25 MG tablet, Take 1 tablet (25 mg total) by mouth daily., Disp: 30 tablet, Rfl: 0 .  torsemide (DEMADEX) 20 MG tablet, Take 2 tablets (40 mg total) by mouth daily., Disp: 30 tablet, Rfl: 0 .  traMADol (ULTRAM) 50 MG tablet, Take  1 tablet (50 mg total) by mouth every 8 (eight) hours as needed for moderate pain., Disp: 10 tablet, Rfl: 0   COUNSELING POINTS/CLINICAL PEARLS  Torsemide  Side effects may include excessive urination.  Tell patient to report symptoms of ototoxicity.  Instruct patient to report lightheadedness or syncope.  Warn patient to avoid use of nonprescription NSAID products without first discussing it with their healthcare provider. Spironolactone  Warn patient to report dehydration, hypotension, or symptoms of worsening renal function.  Counsel male patient to report gynecomastia.  Side effects may include diarrhea, nausea, vomiting, abdominal cramping, fever, leg cramps, lethargy, mental confusion, decreased libido, irregular menses, and rash. Suspension: Tell patient to take drug consistently with respect to food, either before or after a meal.  Advise patient to avoid potassium supplements and foods containing high levels of potassium, including salt substitutes.  DRUGS TO AVOID IN HEART FAILURE  Drug or Class Mechanism  Analgesics . NSAIDs . COX-2 inhibitors . Glucocorticoids  Sodium and water retention, increased systemic vascular resistance, decreased  response to diuretics   Diabetes Medications . Metformin . Thiazolidinediones o Rosiglitazone (Avandia) o Pioglitazone (Actos) . DPP4 Inhibitors o Saxagliptin (Onglyza) o Sitagliptin (Januvia)   Lactic acidosis Possible calcium channel blockade   Unknown  Antiarrhythmics . Class I  o Flecainide o Disopyramide . Class III o Sotalol . Other o Dronedarone  Negative inotrope, proarrhythmic   Proarrhythmic, beta blockade  Negative inotrope  Antihypertensives . Alpha Blockers o Doxazosin . Calcium Channel Blockers o Diltiazem o Verapamil o Nifedipine . Central Alpha Adrenergics o Moxonidine . Peripheral Vasodilators o Minoxidil  Increases renin and aldosterone  Negative inotrope    Possible sympathetic withdrawal  Unknown  Anti-infective . Itraconazole . Amphotericin B  Negative inotrope Unknown  Hematologic . Anagrelide . Cilostazol   Possible inhibition of PD IV Inhibition of PD III causing arrhythmias  Neurologic/Psychiatric . Stimulants . Anti-Seizure Drugs o Carbamazepine o Pregabalin . Antidepressants o Tricyclics o Citalopram . Parkinsons o Bromocriptine o Pergolide o Pramipexole . Antipsychotics o Clozapine . Antimigraine o Ergotamine o Methysergide . Appetite suppressants . Bipolar o Lithium  Peripheral alpha and beta agonist activity  Negative inotrope and chronotrope Calcium channel blockade  Negative inotrope, proarrhythmic Dose-dependent QT prolongation  Excessive serotonin activity/valvular damage Excessive serotonin activity/valvular damage Unknown  IgE mediated hypersensitivy, calcium channel blockade  Excessive serotonin activity/valvular damage Excessive serotonin activity/valvular damage Valvular damage  Direct myofibrillar degeneration, adrenergic stimulation  Antimalarials . Chloroquine . Hydroxychloroquine Intracellular inhibition of lysosomal enzymes  Urologic Agents . Alpha  Blockers o Doxazosin o Prazosin o Tamsulosin o Terazosin  Increased renin and aldosterone  Adapted from Page RL, et al. "Drugs That May Cause or Exacerbate Heart Failure: A Scientific Statement from the [place]." Circulation 2016; 536:U44-I34. DOI: 10.1161/CIR.0000000000000426   MEDICATION ADHERENCES TIPS AND STRATEGIES 1. Taking medication as prescribed improves patient outcomes in heart failure (reduces hospitalizations, improves symptoms, increases survival) 2. Side effects of medications can be managed by decreasing doses, switching agents, stopping drugs, or adding additional therapy. Please let someone in the [facility] know if you have having bothersome side effects so we can modify your regimen. Do not alter your medication regimen without talking to [place].  3. Medication reminders can help patients remember to take drugs on time. If you are missing or forgetting doses you can try linking behaviors, using pill boxes, or an electronic reminder like an alarm on your phone or an app. Some people can also get automated  phone calls as medication reminders.

## 2020-03-03 NOTE — Patient Instructions (Signed)
Continue weighing daily and call for an overnight weight gain of > 2 pounds or a weekly weight gain of >5 pounds. 

## 2020-03-04 ENCOUNTER — Other Ambulatory Visit: Payer: Self-pay

## 2020-03-04 DIAGNOSIS — D500 Iron deficiency anemia secondary to blood loss (chronic): Secondary | ICD-10-CM

## 2020-03-09 NOTE — Progress Notes (Signed)
Patient ID: Corey West, male    DOB: 03/03/1951, 69 y.o.   MRN: [id number]  HPI  Mr Fiallos is a 69 y/o male with a history of DM, hyperlipidemia, HTN, CKD, cirrhosis, GERD, arthritis, depression, anxiety, sleep apnea, previous tobacco use and chronic heart failure.   Echo report from 12/16/19 reviewed and showed an EF of 60-65% along with moderate LVH.  Admitted 02/23/20 due to left lower leg cellulitis. Wound care consult done. Given IV antibiotics. PT/OT consults obtained. Discharged after 4 days to SNF. Admitted 01/28/20 due to acute on chronic anemia and HF. GI, cardiology, nephrology and wound consults obtained. Given IV iron. Needed to stay on oxygen at night. Was on lasix gtt and then transitioned to oral diuretics. No beta-blocker due to bradycardia. Discharged after 5 days.  He presents today for an acute visit with a chief complaint of minimal shortness of breath upon moderate exertion. He says that this has been present for several years and has improved since his diuretics were changed. He has associated fatigue, cough, pedal edema, scrotal swelling (improving) and light-headedness along with this. He denies any difficulty sleeping, abdominal distention, palpitations, chest pain or wheezing.   Says that prior to getting the new diuretic started yesterday, he was steadily gaining weight. He saw nephrology on 03/07/20 who added metolazone twice weekly and adjusted torsemide. He says that he just got the metolazone for the first time yesterday but already has noticed a difference in how he feels. He says that his breathing is improved and the scrotal swelling is better.   Past Medical History:  Diagnosis Date   Anemia    Anxiety    controlled;    Arthritis    AVM (arteriovenous malformation) of stomach, acquired with hemorrhage    Barrett's esophagus    CHF (congestive heart failure) (HCC)    Chronic kidney disease    renal infufficiency   Cirrhosis (HCC)    Depression     controlled;    Diabetes mellitus without complication (HCC)    not controlled, taking insulin but sugar continues to run high;    Edema    Esophageal varices (HCC)    GAVE (gastric antral vascular ectasia)    GERD (gastroesophageal reflux disease)    History of hiatal hernia    Hyperlipidemia    Hypertension    controlled well;    Liver cirrhosis (HCC)    Nephropathy, 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[place])    Obesity    Pancytopenia (HCC)    Polyp, stomach    with chronic blood loss   Sleep apnea    does not wear a cpap, Medicare would not pay for it    Venous stasis dermatitis of both lower extremities    Venous stasis of both lower extremities    with cellulitis   Past Surgical History:  Procedure Laterality Date   ESOPHAGOGASTRODUODENOSCOPY N/A 02/09/2015   Procedure: ESOPHAGOGASTRODUODENOSCOPY (EGD);  Surgeon: Manya Silvas, MD;  Location: [facility] ENDOSCOPY;  Service: Endoscopy;  Laterality: N/A;   ESOPHAGOGASTRODUODENOSCOPY N/A 07/22/2015   Procedure: ESOPHAGOGASTRODUODENOSCOPY (EGD);  Surgeon: Manya Silvas, MD;  Location: [facility] ENDOSCOPY;  Service: Endoscopy;  Laterality: N/A;   ESOPHAGOGASTRODUODENOSCOPY N/A 06/26/2017   Procedure: ESOPHAGOGASTRODUODENOSCOPY (EGD);  Surgeon: Manya Silvas, MD;  Location: [facility] ENDOSCOPY;  Service: Endoscopy;  Laterality: N/A;   ESOPHAGOGASTRODUODENOSCOPY N/A 06/27/2017   Procedure: ESOPHAGOGASTRODUODENOSCOPY (EGD);  Surgeon: Manya Silvas, MD;  Location: [facility] ENDOSCOPY;  Service: Endoscopy;  Laterality: N/A;   ESOPHAGOGASTRODUODENOSCOPY N/A 06/28/2017  Procedure: ESOPHAGOGASTRODUODENOSCOPY (EGD);  Surgeon: Manya Silvas, MD;  Location[facility] ENDOSCOPY;  Service: Endoscopy;  Laterality: N/A;   ESOPHAGOGASTRODUODENOSCOPY Bilateral 10/11/2017   Procedure: ESOPHAGOGASTRODUODENOSCOPY (EGD);  Surgeon: Manya Silvas, MD;  Location: [facility] ENDOSCOPY;  Service: Endoscopy;  Laterality: Bilateral;   ESOPHAGOGASTRODUODENOSCOPY N/A 12/04/2017    Procedure: ESOPHAGOGASTRODUODENOSCOPY (EGD);  Surgeon: Manya Silvas, MD;  Location: [facility] ENDOSCOPY;  Service: Endoscopy;  Laterality: N/A;   ESOPHAGOGASTRODUODENOSCOPY N/A 03/06/2019   Procedure: ESOPHAGOGASTRODUODENOSCOPY (EGD);  Surgeon: Toledo, Benay Pike, MD;  Location: [facility] ENDOSCOPY;  Service: Gastroenterology;  Laterality: N/A;   ESOPHAGOGASTRODUODENOSCOPY (EGD) WITH PROPOFOL N/A 07/20/2015   Procedure: ESOPHAGOGASTRODUODENOSCOPY (EGD) WITH PROPOFOL;  Surgeon: Manya Silvas, MD;  Location: [facility] ENDOSCOPY;  Service: Endoscopy;  Laterality: N/A;   ESOPHAGOGASTRODUODENOSCOPY (EGD) WITH PROPOFOL N/A 09/16/2015   Procedure: ESOPHAGOGASTRODUODENOSCOPY (EGD) WITH PROPOFOL;  Surgeon: Manya Silvas, MD;  Location: [facility] ENDOSCOPY;  Service: Endoscopy;  Laterality: N/A;   ESOPHAGOGASTRODUODENOSCOPY (EGD) WITH PROPOFOL N/A 03/16/2016   Procedure: ESOPHAGOGASTRODUODENOSCOPY (EGD) WITH PROPOFOL;  Surgeon: Manya Silvas, MD;  Location: [facility] ENDOSCOPY;  Service: Endoscopy;  Laterality: N/A;   ESOPHAGOGASTRODUODENOSCOPY (EGD) WITH PROPOFOL N/A 09/14/2016   Procedure: ESOPHAGOGASTRODUODENOSCOPY (EGD) WITH PROPOFOL;  Surgeon: Manya Silvas, MD;  Location: [facility] ENDOSCOPY;  Service: Endoscopy;  Laterality: N/A;   ESOPHAGOGASTRODUODENOSCOPY (EGD) WITH PROPOFOL N/A 11/05/2016   Procedure: ESOPHAGOGASTRODUODENOSCOPY (EGD) WITH PROPOFOL;  Surgeon: Manya Silvas, MD;  Location: [facility] ENDOSCOPY;  Service: Endoscopy;  Laterality: N/A;   ESOPHAGOGASTRODUODENOSCOPY (EGD) WITH PROPOFOL N/A 02/06/2017   Procedure: ESOPHAGOGASTRODUODENOSCOPY (EGD) WITH PROPOFOL;  Surgeon: Manya Silvas, MD;  Location: [facility] ENDOSCOPY;  Service: Endoscopy;  Laterality: N/A;   ESOPHAGOGASTRODUODENOSCOPY (EGD) WITH PROPOFOL N/A 04/24/2017   Procedure: ESOPHAGOGASTRODUODENOSCOPY (EGD) WITH PROPOFOL;  Surgeon: Manya Silvas, MD;  Location: [facility] ENDOSCOPY;  Service: Endoscopy;  Laterality: N/A;    ESOPHAGOGASTRODUODENOSCOPY (EGD) WITH PROPOFOL N/A 07/31/2017   Procedure: ESOPHAGOGASTRODUODENOSCOPY (EGD) WITH PROPOFOL;  Surgeon: Manya Silvas, MD;  Location: [facility] ENDOSCOPY;  Service: Endoscopy;  Laterality: N/A;   ESOPHAGOGASTRODUODENOSCOPY (EGD) WITH PROPOFOL N/A 08/08/2018   Procedure: ESOPHAGOGASTRODUODENOSCOPY (EGD) WITH PROPOFOL;  Surgeon: Manya Silvas, MD;  Location: [facility] ENDOSCOPY;  Service: Endoscopy;  Laterality: N/A;   GIVENS CAPSULE STUDY N/A 12/05/2017   Procedure: GIVENS CAPSULE STUDY;  Surgeon: Manya Silvas, MD;  Location: [facility] ENDOSCOPY;  Service: Endoscopy;  Laterality: N/A;   TONSILLECTOMY     TONSILLECTOMY AND ADENOIDECTOMY     ULNAR NERVE TRANSPOSITION     UVULOPALATOPHARYNGOPLASTY     Family History  Problem Relation Age of Onset   Diabetes Other    Transient ischemic attack Father    CAD Father    Social History   Tobacco Use   Smoking status: Former Smoker    Years: 20.00    Types: Cigars, Cigarettes   Smokeless tobacco: Never Used  Substance Use Topics   Alcohol use: No    Comment: stopped 15 years ago   No Known Allergies  Prior to Admission medications   Medication Sig Start Date End Date Taking? Authorizing Provider  amLODipine (NORVASC) 5 MG tablet Take 1 tablet (5 mg total) by mouth daily. 02/13/20  Yes Wieting, Richard, MD  atorvastatin (LIPITOR) 10 MG tablet Take 2 tablets (20 mg total) by mouth daily at 6 PM. 12/17/19  Yes Amery, Gwynneth Albright, MD  feeding supplement, ENSURE ENLIVE, (ENSURE ENLIVE) LIQD Take 237 mLs by mouth 2 (two) times daily between meals. 02/12/20  Yes Loletha Grayer, MD  hydrALAZINE (APRESOLINE)  25 MG tablet Take 1 tablet (25 mg total) by mouth in the morning and at bedtime. 02/27/20  Yes Jennye Boroughs, MD  insulin aspart (NOVOLOG) 100 UNIT/ML injection Inject 4 Units into the skin 3 (three) times daily with meals. 02/12/20  Yes Wieting, Richard, MD  insulin detemir (LEVEMIR) 100 UNIT/ML injection Inject 0.08  mLs (8 Units total) into the skin daily. 02/12/20  Yes Wieting, Richard, MD  Ipratropium-Albuterol (COMBIVENT RESPIMAT) 20-100 MCG/ACT AERS respimat Inhale 1 puff into the lungs every 6 (six) hours. 02/12/20  Yes Wieting, Richard, MD  iron polysaccharides (NIFEREX) 150 MG capsule Take 1 capsule (150 mg total) by mouth daily. 02/13/20  Yes Wieting, Richard, MD  lactulose (CHRONULAC) 10 GM/15ML solution Take 45 mLs (30 g total) by mouth 2 (two) times daily. 02/12/20  Yes Wieting, Richard, MD  metolazone (ZAROXOLYN) 5 MG tablet Take 5 mg by mouth 2 (two) times a week.   Yes [provider]  Multiple Vitamin (MULTIVITAMIN) tablet Take 1 tablet by mouth daily.   Yes [provider]  pantoprazole (PROTONIX) 40 MG tablet Take 1 tablet (40 mg total) by mouth 2 (two) times daily. 03/06/19 03/10/20 Yes Dustin Flock, MD  rifaximin (XIFAXAN) 550 MG TABS tablet Take 1 tablet (550 mg total) by mouth 2 (two) times daily. 02/12/20  Yes Wieting, Richard, MD  sodium chloride (OCEAN) 0.65 % SOLN nasal spray Place 1 spray into both nostrils as needed for congestion (nose irritation). 02/12/20  Yes Wieting, Richard, MD  spironolactone (ALDACTONE) 25 MG tablet Take 1 tablet (25 mg total) by mouth daily. 02/13/20  Yes Wieting, Richard, MD  torsemide (DEMADEX) 20 MG tablet Take 2 tablets (40 mg total) by mouth daily. 02/13/20  Yes Wieting, Richard, MD  torsemide (DEMADEX) 20 MG tablet Take 20 mg by mouth daily.   Yes [provider]  traMADol (ULTRAM) 50 MG tablet Take 1 tablet (50 mg total) by mouth every 8 (eight) hours as needed for moderate pain. 02/27/20  Yes Jennye Boroughs, MD    Review of Systems  Constitutional: Positive for fatigue. Negative for appetite change (decreased).  HENT: Positive for congestion. Negative for postnasal drip and sore throat.   Eyes: Negative.   Respiratory: Positive for cough and shortness of breath (better today). Negative for wheezing.   Cardiovascular: Positive for  leg swelling (lower legs wrapped in ACE wraps). Negative for chest pain and palpitations.  Gastrointestinal: Negative for abdominal distention and abdominal pain.  Endocrine: Negative.   Genitourinary: Positive for scrotal swelling ("better").  Musculoskeletal: Positive for arthralgias (left sided rib pain). Negative for back pain.  Skin: Negative.   Allergic/Immunologic: Negative.   Neurological: Positive for light-headedness (with sudden position changes). Negative for dizziness.  Hematological: Negative for adenopathy. Does not bruise/bleed easily.  Psychiatric/Behavioral: Negative for dysphoric mood and sleep disturbance (oxygen at night sometimes). The patient is not nervous/anxious.    Vitals:   03/10/20 1219  BP: (!) 124/38  Pulse: 64  Resp: 18  SpO2: 100%  Weight: 289 lb 2 oz (131.1 kg)  Height: 5\' 9"  (1.753 m)   Wt Readings from Last 3 Encounters:  03/10/20 289 lb 2 oz (131.1 kg)  03/03/20 288 lb 8 oz (130.9 kg)  02/23/20 292 lb 8.8 oz (132.7 kg)   Lab Results  Component Value Date   CREATININE 3.11 (H) 02/26/2020   CREATININE 3.10 (H) 02/25/2020   CREATININE 2.98 (H) 02/24/2020    Physical Exam Vitals and nursing note reviewed.  Constitutional:  Appearance: He is well-developed.  HENT:     Head: Normocephalic and atraumatic.  Neck:     Vascular: No JVD.  Cardiovascular:     Rate and Rhythm: Normal rate and regular rhythm.  Pulmonary:     Effort: Pulmonary effort is normal. No respiratory distress.     Breath sounds: No wheezing or rales.  Abdominal:     Palpations: Abdomen is soft.     Tenderness: There is no abdominal tenderness.  Musculoskeletal:     Cervical back: Normal range of motion and neck supple.     Right lower leg: No tenderness. Edema (wrapped in ACE wrap) present.     Left lower leg: No tenderness. Edema (wrapped in ACE wrap) present.  Skin:    General: Skin is warm and dry.  Neurological:     General: No focal deficit present.      Mental Status: He is alert and oriented to person, place, and time.  Psychiatric:        Mood and Affect: Mood normal.        Behavior: Behavior normal.     Assessment & Plan:  1: Chronic heart failure with preserved ejection fraction with structural changes- - NYHA class II - euvolemic today - being weighed daily; reminded to call for an overnight weight gain of >2 pounds or a weekly weight gain of >5 pounds - weight stable from last visit 1 week ago; patient says his weight has declined since getting metolazone yesterday; will not adjust diuretics today since metolazone just get started yesterday - not adding salt to his food but he's unsure of how much sodium he's currently getting since he's at rehab - doubt renal function could tolerate entresto; will defer this to nephrology - currently getting PT and hoping to go home next week - currently has both legs wrapped in ACE bandages; tries to elevate his legs when sitting for long periods of time - will let paramedicine aware that he should be going home next week - BNP 02/23/20 was 213.9  2: HTN- - BP looks good today - saw PCP Ola Spurr) 03/02/20 & returns in 2 weeks - BMP 03/02/20 reviewed and showed sodium 137, potassium 3.4, creatinine 2.3 and GFR 28  3: DM- - A1c 02/23/20 was 6.9% - nonfasting glucose in clinic today was 105 - saw nephrology Candiss Norse) 03/07/20 & returns in 3 weeks   Facility medication list was reviewed.   Keep already scheduled appointment with [place] early July 2021. Return sooner if needed.

## 2020-03-10 ENCOUNTER — Emergency Department
Admission: EM | Admit: 2020-03-10 | Discharge: 2020-03-11 | Disposition: A | Discharge status: Skilled Nursing Facility | Payer: Medicare Other | Attending: Emergency Medicine | Admitting: Emergency Medicine

## 2020-03-10 ENCOUNTER — Ambulatory Visit: Payer: Medicare Other | Admitting: Family

## 2020-03-10 ENCOUNTER — Encounter: Payer: Self-pay | Admitting: Emergency Medicine

## 2020-03-10 ENCOUNTER — Other Ambulatory Visit: Payer: Self-pay

## 2020-03-10 ENCOUNTER — Emergency Department: Payer: Medicare Other

## 2020-03-10 ENCOUNTER — Encounter: Payer: Self-pay | Admitting: Family

## 2020-03-10 VITALS — BP 124/38 | HR 64 | Resp 18 | Ht 69.0 in | Wt 289.1 lb

## 2020-03-10 DIAGNOSIS — E669 Obesity, unspecified: Secondary | ICD-10-CM | POA: Insufficient documentation

## 2020-03-10 DIAGNOSIS — Z8719 Personal history of other diseases of the digestive system: Secondary | ICD-10-CM | POA: Insufficient documentation

## 2020-03-10 DIAGNOSIS — D649 Anemia, unspecified: Secondary | ICD-10-CM

## 2020-03-10 DIAGNOSIS — N189 Chronic kidney disease, unspecified: Secondary | ICD-10-CM | POA: Diagnosis not present

## 2020-03-10 DIAGNOSIS — Z79899 Other long term (current) drug therapy: Secondary | ICD-10-CM | POA: Insufficient documentation

## 2020-03-10 DIAGNOSIS — Z794 Long term (current) use of insulin: Secondary | ICD-10-CM

## 2020-03-10 DIAGNOSIS — Z833 Family history of diabetes mellitus: Secondary | ICD-10-CM | POA: Insufficient documentation

## 2020-03-10 DIAGNOSIS — R0600 Dyspnea, unspecified: Secondary | ICD-10-CM

## 2020-03-10 DIAGNOSIS — I509 Heart failure, unspecified: Secondary | ICD-10-CM | POA: Diagnosis not present

## 2020-03-10 DIAGNOSIS — I130 Hypertensive heart and chronic kidney disease with heart failure and stage 1 through stage 4 chronic kidney disease, or unspecified chronic kidney disease: Secondary | ICD-10-CM | POA: Insufficient documentation

## 2020-03-10 DIAGNOSIS — E1122 Type 2 diabetes mellitus with diabetic chronic kidney disease: Secondary | ICD-10-CM | POA: Diagnosis not present

## 2020-03-10 DIAGNOSIS — E785 Hyperlipidemia, unspecified: Secondary | ICD-10-CM | POA: Insufficient documentation

## 2020-03-10 DIAGNOSIS — I5032 Chronic diastolic (congestive) heart failure: Secondary | ICD-10-CM | POA: Insufficient documentation

## 2020-03-10 DIAGNOSIS — K219 Gastro-esophageal reflux disease without esophagitis: Secondary | ICD-10-CM | POA: Insufficient documentation

## 2020-03-10 DIAGNOSIS — Z87891 Personal history of nicotine dependence: Secondary | ICD-10-CM | POA: Insufficient documentation

## 2020-03-10 DIAGNOSIS — G4730 Sleep apnea, unspecified: Secondary | ICD-10-CM | POA: Insufficient documentation

## 2020-03-10 DIAGNOSIS — K7460 Unspecified cirrhosis of liver: Secondary | ICD-10-CM | POA: Insufficient documentation

## 2020-03-10 DIAGNOSIS — Z8249 Family history of ischemic heart disease and other diseases of the circulatory system: Secondary | ICD-10-CM | POA: Insufficient documentation

## 2020-03-10 DIAGNOSIS — R0602 Shortness of breath: Secondary | ICD-10-CM | POA: Diagnosis present

## 2020-03-10 DIAGNOSIS — I10 Essential (primary) hypertension: Secondary | ICD-10-CM

## 2020-03-10 DIAGNOSIS — M1990 Unspecified osteoarthritis, unspecified site: Secondary | ICD-10-CM | POA: Insufficient documentation

## 2020-03-10 LAB — BASIC METABOLIC PANEL
Anion gap: 9 (ref 5–15)
BUN: 53 mg/dL — ABNORMAL HIGH (ref 8–23)
CO2: 28 mmol/L (ref 22–32)
Calcium: 8.8 mg/dL — ABNORMAL LOW (ref 8.9–10.3)
Chloride: 96 mmol/L — ABNORMAL LOW (ref 98–111)
Creatinine, Ser: 3.01 mg/dL — ABNORMAL HIGH (ref 0.61–1.24)
GFR calc Af Amer: 24 mL/min — ABNORMAL LOW (ref >60)
GFR calc non Af Amer: 20 mL/min — ABNORMAL LOW (ref >60)
Glucose, Bld: 372 mg/dL — ABNORMAL HIGH (ref 70–99)
Potassium: 4.1 mmol/L (ref 3.5–5.1)
Sodium: 133 mmol/L — ABNORMAL LOW (ref 135–145)

## 2020-03-10 LAB — CBC WITH DIFFERENTIAL/PLATELET
Abs Immature Granulocytes: 0.02 10*3/uL (ref 0.00–0.07)
Basophils Absolute: 0 10*3/uL (ref 0.0–0.1)
Basophils Relative: 0 %
Eosinophils Absolute: 0 10*3/uL (ref 0.0–0.5)
Eosinophils Relative: 0 %
HCT: 20.3 % — ABNORMAL LOW (ref 39.0–52.0)
Hemoglobin: 6.8 g/dL — ABNORMAL LOW (ref 13.0–17.0)
Immature Granulocytes: 1 %
Lymphocytes Relative: 12 %
Lymphs Abs: 0.4 10*3/uL — ABNORMAL LOW (ref 0.7–4.0)
MCH: 30.1 pg (ref 26.0–34.0)
MCHC: 33.5 g/dL (ref 30.0–36.0)
MCV: 89.8 fL (ref 80.0–100.0)
Monocytes Absolute: 0.4 10*3/uL (ref 0.1–1.0)
Monocytes Relative: 10 %
Neutro Abs: 2.8 10*3/uL (ref 1.7–7.7)
Neutrophils Relative %: 77 %
Platelets: 88 10*3/uL — ABNORMAL LOW (ref 150–400)
RBC: 2.26 MIL/uL — ABNORMAL LOW (ref 4.22–5.81)
RDW: 17.2 % — ABNORMAL HIGH (ref 11.5–15.5)
WBC: 3.7 10*3/uL — ABNORMAL LOW (ref 4.0–10.5)
nRBC: 0 % (ref 0.0–0.2)

## 2020-03-10 LAB — PREPARE RBC (CROSSMATCH)

## 2020-03-10 LAB — GLUCOSE, CAPILLARY: Glucose-Capillary: 165 mg/dL — ABNORMAL HIGH (ref 70–99)

## 2020-03-10 LAB — BRAIN NATRIURETIC PEPTIDE: B Natriuretic Peptide: 167.4 pg/mL — ABNORMAL HIGH (ref 0.0–100.0)

## 2020-03-10 LAB — TROPONIN I (HIGH SENSITIVITY): Troponin I (High Sensitivity): 16 ng/L (ref <18)

## 2020-03-10 MED ORDER — INSULIN ASPART 100 UNIT/ML ~~LOC~~ SOLN
5.0000 [IU] | Freq: Once | SUBCUTANEOUS | Status: AC
  Administered 2020-03-11: 5 [IU] via SUBCUTANEOUS
  Filled 2020-03-10: qty 1

## 2020-03-10 MED ORDER — TORSEMIDE 20 MG PO TABS
20.0000 mg | ORAL_TABLET | Freq: Every day | ORAL | Status: DC
  Administered 2020-03-11: 20 mg via ORAL
  Filled 2020-03-10 (×2): qty 1

## 2020-03-10 MED ORDER — SODIUM CHLORIDE 0.9 % IV SOLN
10.0000 mL/h | Freq: Once | INTRAVENOUS | Status: AC
  Administered 2020-03-11: 10 mL/h via INTRAVENOUS

## 2020-03-10 NOTE — ED Triage Notes (Signed)
Pt to ED via EMS from [facility] c/o SOB and dizziness that started 2hr PTA.  Hx of anemia and CHF and MD from [facility] requesting blood transfusion.  Pt normally wears 3L [place] and 99%, A&Ox4, bilateral edema.

## 2020-03-10 NOTE — ED Provider Notes (Signed)
ER Provider Note       Time seen: 8:50 PM    I have reviewed the vital signs and the nursing notes.  HISTORY   Chief Complaint No chief complaint on file.    HPI Corey West is a 69 y.o. male with a history of anemia, anxiety, arthritis, CHF, chronic kidney disease, cirrhosis, depression, diabetes, hyperlipidemia, hypertension who presents today for anemia and possible need for blood transfusion.  Patient had outpatient labs today which revealed a hemoglobin around 6 or so.  Patient states he can tell he is anemic and needs blood.  He is on 3 L nasal cannula oxygen all the time.  Past Medical History:  Diagnosis Date  . Anemia   . Anxiety    controlled;   . Arthritis   . AVM (arteriovenous malformation) of stomach, acquired with hemorrhage   . Barrett's esophagus   . CHF (congestive heart failure) ([place])   . Chronic kidney disease    renal infufficiency  . Cirrhosis ([place])   . Depression    controlled;   Marland Kitchen Diabetes mellitus without complication ([place])    not controlled, taking insulin but sugar continues to run high;   . Edema   . Esophageal varices ([place])   . GAVE (gastric antral vascular ectasia)   . GERD (gastroesophageal reflux disease)   . History of hiatal hernia   . Hyperlipidemia   . Hypertension    controlled well;   . Liver cirrhosis ([place])   . 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 Obesity   . Pancytopenia ([place])   . Polyp, stomach    with chronic blood loss  . Sleep apnea    does not wear a cpap, Medicare would not pay for it   . Venous stasis dermatitis of both lower extremities   . Venous stasis of both lower extremities    with cellulitis    Past Surgical History:  Procedure Laterality Date  . ESOPHAGOGASTRODUODENOSCOPY N/A 02/09/2015   Procedure: ESOPHAGOGASTRODUODENOSCOPY (EGD);  Surgeon: Manya Silvas, MD;  Location: [facility] ENDOSCOPY;  Service: Endoscopy;  Laterality: N/A;  . ESOPHAGOGASTRODUODENOSCOPY N/A 07/22/2015   Procedure:  ESOPHAGOGASTRODUODENOSCOPY (EGD);  Surgeon: Manya Silvas, MD;  Location: [facility] ENDOSCOPY;  Service: Endoscopy;  Laterality: N/A;  . ESOPHAGOGASTRODUODENOSCOPY N/A 06/26/2017   Procedure: ESOPHAGOGASTRODUODENOSCOPY (EGD);  Surgeon: Manya Silvas, MD;  Location: [facility] ENDOSCOPY;  Service: Endoscopy;  Laterality: N/A;  . ESOPHAGOGASTRODUODENOSCOPY N/A 06/27/2017   Procedure: ESOPHAGOGASTRODUODENOSCOPY (EGD);  Surgeon: Manya Silvas, MD;  Location: [facility] ENDOSCOPY;  Service: Endoscopy;  Laterality: N/A;  . ESOPHAGOGASTRODUODENOSCOPY N/A 06/28/2017   Procedure: ESOPHAGOGASTRODUODENOSCOPY (EGD);  Surgeon: Manya Silvas, MD;  Location: [facility] ENDOSCOPY;  Service: Endoscopy;  Laterality: N/A;  . ESOPHAGOGASTRODUODENOSCOPY Bilateral 10/11/2017   Procedure: ESOPHAGOGASTRODUODENOSCOPY (EGD);  Surgeon: Manya Silvas, MD;  Location: [facility] ENDOSCOPY;  Service: Endoscopy;  Laterality: Bilateral;  . ESOPHAGOGASTRODUODENOSCOPY N/A 12/04/2017   Procedure: ESOPHAGOGASTRODUODENOSCOPY (EGD);  Surgeon: Manya Silvas, MD;  Location: [facility] ENDOSCOPY;  Service: Endoscopy;  Laterality: N/A;  . ESOPHAGOGASTRODUODENOSCOPY N/A 03/06/2019   Procedure: ESOPHAGOGASTRODUODENOSCOPY (EGD);  Surgeon: Toledo, Benay Pike, MD;  Location: [facility] ENDOSCOPY;  Service: Gastroenterology;  Laterality: N/A;  . ESOPHAGOGASTRODUODENOSCOPY (EGD) WITH PROPOFOL N/A 07/20/2015   Procedure: ESOPHAGOGASTRODUODENOSCOPY (EGD) WITH PROPOFOL;  Surgeon: Manya Silvas, MD;  Location: [facility] ENDOSCOPY;  Service: Endoscopy;  Laterality: N/A;  . ESOPHAGOGASTRODUODENOSCOPY (EGD) WITH PROPOFOL N/A 09/16/2015   Procedure: ESOPHAGOGASTRODUODENOSCOPY (EGD) WITH PROPOFOL;  Surgeon: Manya Silvas, MD;  Location: [place];  Service: Endoscopy;  Laterality: N/A;  . ESOPHAGOGASTRODUODENOSCOPY (EGD) WITH PROPOFOL N/A 03/16/2016   Procedure: ESOPHAGOGASTRODUODENOSCOPY (EGD) WITH PROPOFOL;  Surgeon: Manya Silvas, MD;  Location: [facility] ENDOSCOPY;  Service:  Endoscopy;  Laterality: N/A;  . ESOPHAGOGASTRODUODENOSCOPY (EGD) WITH PROPOFOL N/A 09/14/2016   Procedure: ESOPHAGOGASTRODUODENOSCOPY (EGD) WITH PROPOFOL;  Surgeon: Manya Silvas, MD;  Location: [facility] ENDOSCOPY;  Service: Endoscopy;  Laterality: N/A;  . ESOPHAGOGASTRODUODENOSCOPY (EGD) WITH PROPOFOL N/A 11/05/2016   Procedure: ESOPHAGOGASTRODUODENOSCOPY (EGD) WITH PROPOFOL;  Surgeon: Manya Silvas, MD;  Location: [facility] ENDOSCOPY;  Service: Endoscopy;  Laterality: N/A;  . ESOPHAGOGASTRODUODENOSCOPY (EGD) WITH PROPOFOL N/A 02/06/2017   Procedure: ESOPHAGOGASTRODUODENOSCOPY (EGD) WITH PROPOFOL;  Surgeon: Manya Silvas, MD;  Location: [facility] ENDOSCOPY;  Service: Endoscopy;  Laterality: N/A;  . ESOPHAGOGASTRODUODENOSCOPY (EGD) WITH PROPOFOL N/A 04/24/2017   Procedure: ESOPHAGOGASTRODUODENOSCOPY (EGD) WITH PROPOFOL;  Surgeon: Manya Silvas, MD;  Location: [facility] ENDOSCOPY;  Service: Endoscopy;  Laterality: N/A;  . ESOPHAGOGASTRODUODENOSCOPY (EGD) WITH PROPOFOL N/A 07/31/2017   Procedure: ESOPHAGOGASTRODUODENOSCOPY (EGD) WITH PROPOFOL;  Surgeon: Manya Silvas, MD;  Location: [facility] ENDOSCOPY;  Service: Endoscopy;  Laterality: N/A;  . ESOPHAGOGASTRODUODENOSCOPY (EGD) WITH PROPOFOL N/A 08/08/2018   Procedure: ESOPHAGOGASTRODUODENOSCOPY (EGD) WITH PROPOFOL;  Surgeon: Manya Silvas, MD;  Location: [facility] ENDOSCOPY;  Service: Endoscopy;  Laterality: N/A;  . GIVENS CAPSULE STUDY N/A 12/05/2017   Procedure: GIVENS CAPSULE STUDY;  Surgeon: Manya Silvas, MD;  Location: [facility] ENDOSCOPY;  Service: Endoscopy;  Laterality: N/A;  . TONSILLECTOMY    . TONSILLECTOMY AND ADENOIDECTOMY    . ULNAR NERVE TRANSPOSITION    . UVULOPALATOPHARYNGOPLASTY      Allergies Patient has no known allergies.   Review of Systems Constitutional: Negative for fever. Cardiovascular: Negative for chest pain. Respiratory: Positive for shortness of breath Gastrointestinal: Negative for abdominal pain, vomiting and  diarrhea. Musculoskeletal: Negative for back pain. Skin: Negative for rash. Neurological: Negative for headaches, focal weakness or numbness.  All systems negative/normal/unremarkable except as stated in the HPI  ____________________________________________   PHYSICAL EXAM:  VITAL SIGNS: There were no vitals filed for this visit.  Constitutional: Alert and oriented. Well appearing and in no distress. Eyes: Conjunctivae are normal. Normal extraocular movements. ENT      Head: Normocephalic and atraumatic.      Nose: No congestion/rhinnorhea.      Mouth/Throat: Mucous membranes are moist.      Neck: No stridor. Cardiovascular: Normal rate, regular rhythm. No murmurs, rubs, or gallops. Respiratory: Normal respiratory effort without tachypnea nor retractions. Breath sounds are clear and equal bilaterally. No wheezes/rales/rhonchi. Gastrointestinal: Soft and nontender. Normal bowel sounds Musculoskeletal: Chronic appearing lower extremity edema is noted Neurologic:  Normal speech and language. No gross focal neurologic deficits are appreciated.  Skin: Pallor is noted,   Bilateral lower extremity erythema is noted Psychiatric: Speech and behavior are normal.  ____________________________________________  EKG: Interpreted by me.  Sinus rhythm with rate of 71 bpm, prolonged PR interval, normal axis, normal QT  ____________________________________________   LABS (pertinent positives/negatives)  Labs Reviewed  CBC WITH DIFFERENTIAL/PLATELET - Abnormal; Notable for the following components:      Result Value   WBC 3.7 (*)    RBC 2.26 (*)    Hemoglobin 6.8 (*)    HCT 20.3 (*)    RDW 17.2 (*)    Platelets 88 (*)    Lymphs Abs 0.4 (*)    All other components within normal limits  BASIC METABOLIC PANEL - Abnormal; Notable for the following components:  Sodium 133 (*)    Chloride 96 (*)    Glucose, Bld 372 (*)    BUN 53 (*)    Creatinine, Ser 3.01 (*)    Calcium 8.8 (*)    GFR  calc non Af Amer 20 (*)    GFR calc Af Amer 24 (*)    All other components within normal limits  BRAIN NATRIURETIC PEPTIDE  TYPE AND SCREEN  PREPARE RBC (CROSSMATCH)  TROPONIN I (HIGH SENSITIVITY)    RADIOLOGY  Images were viewed by me Chest x-ray  DIFFERENTIAL DIAGNOSIS  Anemia, CHF, COPD, pneumonia, coagulopathy  ASSESSMENT AND PLAN  Anemia, dyspnea    Plan: The patient had presented for the possible need for blood transfusion. Patient's labs did reveal acute on chronic anemia with a hemoglobin of 6.8.  We will transfuse him a unit but otherwise he should be stable for follow-up on Tuesday with the cancer center for reevaluation.  Kidney function appears to be about the same.  He does have hyperglycemia and we gave him some subcutaneous insulin here.  Otherwise he appears stable for discharge after transfusion.  Lenise Arena MD    Note: This note was generated in part or whole with voice recognition software. Voice recognition is usually quite accurate but there are transcription errors that can and very often do occur. I apologize for any typographical errors that were not detected and corrected.     Earleen Newport, MD 03/10/20 2205

## 2020-03-10 NOTE — Patient Instructions (Signed)
Continue weighing daily and call for an overnight weight gain of > 2 pounds or a weekly weight gain of >5 pounds. 

## 2020-03-10 NOTE — ED Provider Notes (Signed)
----------------------------------------- °  11:07 PM on 03/10/2020 -----------------------------------------  Blood pressure (!) 161/61, pulse 76, temperature 98.6 F (37 C), temperature source Oral, resp. rate (!) 24, height 5\' 9"  (1.753 m), weight 131.1 kg, SpO2 100 %.  Assuming care from Dr. Jimmye Norman.  In short, Corey West is a 69 y.o. male with a chief complaint of Shortness of Breath and Dizziness .  Refer to the original H&P for additional details.  The current plan of care is to observe for transfusion, after which patient may be discharged home with plan for hematology follow-up.  ----------------------------------------- 5:54 AM on 03/11/2020 -----------------------------------------  Patient completed transfusion without difficulty and is appropriate for discharge home.  He was counseled to follow-up with hematology and his PCP, otherwise return to the ED for new or worsening symptoms.  Patient agrees with plan.    Blake Divine, MD 03/11/20 [phone]

## 2020-03-11 DIAGNOSIS — D649 Anemia, unspecified: Secondary | ICD-10-CM | POA: Diagnosis not present

## 2020-03-11 LAB — TYPE AND SCREEN
ABO/RH(D): A NEG
Antibody Screen: NEGATIVE
Unit division: 0

## 2020-03-11 LAB — BPAM RBC
Blood Product Expiration Date: 202106152359
ISSUE DATE / TIME: 202106102340
Unit Type and Rh: 600

## 2020-03-11 NOTE — ED Notes (Signed)
Blood transfusion rate changed from 159mL/hr to 116mL/hr

## 2020-03-12 NOTE — Progress Notes (Signed)
[place]  [phone]) [phone] [phone]) [phone]  ID: Corey West OB: 05-02-1951  MR#: [id number]  INO#:[id number]  Patient Care Team: Leonel Ramsay, MD as PCP - General (Infectious Diseases) Baxter Hire, MD (Internal Medicine) Lloyd Huger, MD as Consulting Physician (Oncology)  CHIEF COMPLAINT: Anemia, thrombocytopenia  INTERVAL HISTORY: Patient last evaluated in clinic on April 24, 2019.  He is referred back for worsening anemia.  He was seen in the emergency room last week where he required 1 unit of packed red blood cells.  He has multiple chronic medical problems.  He also has noted increased weakness and fatigue. He has no neurologic complaints.  He denies any recent fevers or illnesses.  He has a fair appetite, but denies weight loss.  He denies any chest pain, shortness of breath, cough, or hemoptysis. He denies any nausea, vomiting, constipation, or diarrhea.  He has no melena or hematochezia. He has no urinary complaints.  Patient offers no further specific complaints today.  REVIEW OF SYSTEMS:   Review of Systems  Constitutional: Positive for malaise/fatigue. Negative for fever and weight loss.  Respiratory: Negative.  Negative for cough, hemoptysis and shortness of breath.   Cardiovascular: Negative.  Negative for chest pain and leg swelling.  Gastrointestinal: Negative.  Negative for abdominal pain, blood in stool and melena.  Genitourinary: Negative.  Negative for hematuria.  Musculoskeletal: Negative.  Negative for back pain.  Skin: Negative.  Negative for rash.  Neurological: Positive for weakness. Negative for sensory change, focal weakness and headaches.  Psychiatric/Behavioral: Negative.  The patient is not nervous/anxious.     As per HPI. Otherwise, a complete review of systems is negative.  PAST MEDICAL HISTORY: Past Medical History:  Diagnosis Date  . Anemia   . Anxiety    controlled;   . Arthritis   . AVM  (arteriovenous malformation) of stomach, acquired with hemorrhage   . Barrett's esophagus   . CHF (congestive heart failure) ([place])   . Chronic kidney disease    renal infufficiency  . Cirrhosis ([place])   . Depression    controlled;   Marland Kitchen Diabetes mellitus without complication ([place])    not controlled, taking insulin but sugar continues to run high;   . Edema   . Esophageal varices ([place])   . GAVE (gastric antral vascular ectasia)   . GERD (gastroesophageal reflux disease)   . History of hiatal hernia   . Hyperlipidemia   . Hypertension    controlled well;   . Liver cirrhosis ([place])   . Nephropathy, diabetic ([place])   . Obesity   . Pancytopenia ([place])   . Polyp, stomach    with chronic blood loss  . Sleep apnea    does not wear a cpap, Medicare would not pay for it   . Venous stasis dermatitis of both lower extremities   . Venous stasis of both lower extremities    with cellulitis    PAST SURGICAL HISTORY: Past Surgical History:  Procedure Laterality Date  . ESOPHAGOGASTRODUODENOSCOPY N/A 02/09/2015   Procedure: ESOPHAGOGASTRODUODENOSCOPY (EGD);  Surgeon: Manya Silvas, MD;  Location: [facility] ENDOSCOPY;  Service: Endoscopy;  Laterality: N/A;  . ESOPHAGOGASTRODUODENOSCOPY N/A 07/22/2015   Procedure: ESOPHAGOGASTRODUODENOSCOPY (EGD);  Surgeon: Manya Silvas, MD;  Location: [facility] ENDOSCOPY;  Service: Endoscopy;  Laterality: N/A;  . ESOPHAGOGASTRODUODENOSCOPY N/A 06/26/2017   Procedure: ESOPHAGOGASTRODUODENOSCOPY (EGD);  Surgeon: Manya Silvas, MD;  Location: [facility] ENDOSCOPY;  Service: Endoscopy;  Laterality: N/A;  . ESOPHAGOGASTRODUODENOSCOPY N/A 06/27/2017  Procedure: ESOPHAGOGASTRODUODENOSCOPY (EGD);  Surgeon: Manya Silvas, MD;  Location: [facility] ENDOSCOPY;  Service: Endoscopy;  Laterality: N/A;  . ESOPHAGOGASTRODUODENOSCOPY N/A 06/28/2017   Procedure: ESOPHAGOGASTRODUODENOSCOPY (EGD);  Surgeon: Manya Silvas, MD;  Location: [facility] ENDOSCOPY;  Service: Endoscopy;  Laterality:  N/A;  . ESOPHAGOGASTRODUODENOSCOPY Bilateral 10/11/2017   Procedure: ESOPHAGOGASTRODUODENOSCOPY (EGD);  Surgeon: Manya Silvas, MD;  Location: [facility] ENDOSCOPY;  Service: Endoscopy;  Laterality: Bilateral;  . ESOPHAGOGASTRODUODENOSCOPY N/A 12/04/2017   Procedure: ESOPHAGOGASTRODUODENOSCOPY (EGD);  Surgeon: Manya Silvas, MD;  Location: [facility] ENDOSCOPY;  Service: Endoscopy;  Laterality: N/A;  . ESOPHAGOGASTRODUODENOSCOPY N/A 03/06/2019   Procedure: ESOPHAGOGASTRODUODENOSCOPY (EGD);  Surgeon: Tol[place], Benay Pike, MD;  Location: [facility] ENDOSCOPY;  Service: Gastroenterology;  Laterality: N/A;  . ESOPHAGOGASTRODUODENOSCOPY (EGD) WITH PROPOFOL N/A 07/20/2015   Procedure: ESOPHAGOGASTRODUODENOSCOPY (EGD) WITH PROPOFOL;  Surgeon: Manya Silvas, MD;  Location: [facility] ENDOSCOPY;  Service: Endoscopy;  Laterality: N/A;  . ESOPHAGOGASTRODUODENOSCOPY (EGD) WITH PROPOFOL N/A 09/16/2015   Procedure: ESOPHAGOGASTRODUODENOSCOPY (EGD) WITH PROPOFOL;  Surgeon: Manya Silvas, MD;  Location: [facility] ENDOSCOPY;  Service: Endoscopy;  Laterality: N/A;  . ESOPHAGOGASTRODUODENOSCOPY (EGD) WITH PROPOFOL N/A 03/16/2016   Procedure: ESOPHAGOGASTRODUODENOSCOPY (EGD) WITH PROPOFOL;  Surgeon: Manya Silvas, MD;  Location: [facility] ENDOSCOPY;  Service: Endoscopy;  Laterality: N/A;  . ESOPHAGOGASTRODUODENOSCOPY (EGD) WITH PROPOFOL N/A 09/14/2016   Procedure: ESOPHAGOGASTRODUODENOSCOPY (EGD) WITH PROPOFOL;  Surgeon: Manya Silvas, MD;  Location: [facility] ENDOSCOPY;  Service: Endoscopy;  Laterality: N/A;  . ESOPHAGOGASTRODUODENOSCOPY (EGD) WITH PROPOFOL N/A 11/05/2016   Procedure: ESOPHAGOGASTRODUODENOSCOPY (EGD) WITH PROPOFOL;  Surgeon: Manya Silvas, MD;  Location: [facility] ENDOSCOPY;  Service: Endoscopy;  Laterality: N/A;  . ESOPHAGOGASTRODUODENOSCOPY (EGD) WITH PROPOFOL N/A 02/06/2017   Procedure: ESOPHAGOGASTRODUODENOSCOPY (EGD) WITH PROPOFOL;  Surgeon: Manya Silvas, MD;  Location: [facility] ENDOSCOPY;  Service: Endoscopy;  Laterality: N/A;   . ESOPHAGOGASTRODUODENOSCOPY (EGD) WITH PROPOFOL N/A 04/24/2017   Procedure: ESOPHAGOGASTRODUODENOSCOPY (EGD) WITH PROPOFOL;  Surgeon: Manya Silvas, MD;  Location: [facility] ENDOSCOPY;  Service: Endoscopy;  Laterality: N/A;  . ESOPHAGOGASTRODUODENOSCOPY (EGD) WITH PROPOFOL N/A 07/31/2017   Procedure: ESOPHAGOGASTRODUODENOSCOPY (EGD) WITH PROPOFOL;  Surgeon: Manya Silvas, MD;  Location: [facility] ENDOSCOPY;  Service: Endoscopy;  Laterality: N/A;  . ESOPHAGOGASTRODUODENOSCOPY (EGD) WITH PROPOFOL N/A 08/08/2018   Procedure: ESOPHAGOGASTRODUODENOSCOPY (EGD) WITH PROPOFOL;  Surgeon: Manya Silvas, MD;  Location: [facility] ENDOSCOPY;  Service: Endoscopy;  Laterality: N/A;  . GIVENS CAPSULE STUDY N/A 12/05/2017   Procedure: GIVENS CAPSULE STUDY;  Surgeon: Manya Silvas, MD;  Location: [facility] ENDOSCOPY;  Service: Endoscopy;  Laterality: N/A;  . TONSILLECTOMY    . TONSILLECTOMY AND ADENOIDECTOMY    . ULNAR NERVE TRANSPOSITION    . UVULOPALATOPHARYNGOPLASTY      FAMILY HISTORY: Family History  Problem Relation Age of Onset  . Diabetes Other   . Transient ischemic attack Father   . CAD Father     ADVANCED DIRECTIVES (Y/N):  N  HEALTH MAINTENANCE: Social History   Tobacco Use  . Smoking status: Former Smoker    Years: 20.00    Types: Cigars, Cigarettes  . Smokeless tobacco: Never Used  Vaping Use  . Vaping Use: Never used  Substance Use Topics  . Alcohol use: No    Comment: stopped 15 years ago  . Drug use: No     Colonoscopy:  PAP:  Bone density:  Lipid panel:  No Known Allergies  Current Outpatient Medications  Medication Sig Dispense Refill  . amLODipine (NORVASC) 5 MG tablet Take 1 tablet (5 mg total) by mouth daily.  30 tablet 0  . atorvastatin (LIPITOR) 10 MG tablet Take 2 tablets (20 mg total) by mouth daily at 6 PM. 30 tablet 0  . feeding supplement, ENSURE ENLIVE, (ENSURE ENLIVE) LIQD Take 237 mLs by mouth 2 (two) times daily between meals. 14400 mL 0  . hydrALAZINE  (APRESOLINE) 25 MG tablet Take 1 tablet (25 mg total) by mouth in the morning and at bedtime.    . insulin aspart (NOVOLOG) 100 UNIT/ML injection Inject 4 Units into the skin 3 (three) times daily with meals. 10 mL 0  . insulin detemir (LEVEMIR) 100 UNIT/ML injection Inject 0.08 mLs (8 Units total) into the skin daily. 10 mL 0  . Ipratropium-Albuterol (COMBIVENT RESPIMAT) 20-100 MCG/ACT AERS respimat Inhale 1 puff into the lungs every 6 (six) hours. 4 g 0  . iron polysaccharides (NIFEREX) 150 MG capsule Take 1 capsule (150 mg total) by mouth daily. 30 capsule 0  . lactulose (CHRONULAC) 10 GM/15ML solution Take 45 mLs (30 g total) by mouth 2 (two) times daily. 1892 mL 0  . metolazone (ZAROXOLYN) 5 MG tablet Take 5 mg by mouth 2 (two) times a week.    . Multiple Vitamin (MULTIVITAMIN) tablet Take 1 tablet by mouth daily.    . rifaximin (XIFAXAN) 550 MG TABS tablet Take 1 tablet (550 mg total) by mouth 2 (two) times daily. 60 tablet 0  . sodium chloride (OCEAN) 0.65 % SOLN nasal spray Place 1 spray into both nostrils as needed for congestion (nose irritation). 60 mL 0  . spironolactone (ALDACTONE) 25 MG tablet Take 1 tablet (25 mg total) by mouth daily. 30 tablet 0  . torsemide (DEMADEX) 20 MG tablet Take 2 tablets (40 mg total) by mouth daily. 30 tablet 0  . torsemide (DEMADEX) 20 MG tablet Take 20 mg by mouth daily.    . traMADol (ULTRAM) 50 MG tablet Take 1 tablet (50 mg total) by mouth every 8 (eight) hours as needed for moderate pain. 10 tablet 0  . pantoprazole (PROTONIX) 40 MG tablet Take 1 tablet (40 mg total) by mouth 2 (two) times daily. 60 tablet 11   No current facility-administered medications for this visit.    OBJECTIVE: Vitals:   03/15/20 0900  BP: (!) 134/49  Pulse: 65  Resp: 20  Temp: (!) 96.8 F (36 C)  SpO2: 100%     Body mass index is 41.85 kg/m.    ECOG FS:0 - Asymptomatic  General: Well-developed, well-nourished, no acute distress. Eyes: Pink conjunctiva, anicteric  sclera. HEENT: Normocephalic, moist mucous membranes. Lungs: No audible wheezing or coughing. Heart: Regular rate and rhythm. Abdomen: Soft, nontender, no obvious distention. Musculoskeletal: No edema, cyanosis, or clubbing. Neuro: Alert, answering all questions appropriately. Cranial nerves grossly intact. Skin: No rashes or petechiae noted. Psych: Normal affect.  LAB RESULTS:  Lab Results  Component Value Date   NA 133 (L) 03/10/2020   K 4.1 03/10/2020   CL 96 (L) 03/10/2020   CO2 28 03/10/2020   GLUCOSE 372 (H) 03/10/2020   BUN 53 (H) 03/10/2020   CREATININE 3.01 (H) 03/10/2020   CALCIUM 8.8 (L) 03/10/2020   PROT 6.6 02/24/2020   ALBUMIN 3.1 (L) 02/24/2020   AST 22 02/24/2020   ALT 21 02/24/2020   ALKPHOS 70 02/24/2020   BILITOT 1.0 02/24/2020   GFRNONAA 20 (L) 03/10/2020   GFRAA 24 (L) 03/10/2020    Lab Results  Component Value Date   WBC 4.1 03/15/2020   NEUTROABS 3.3 03/15/2020   HGB 7.6 (  L) 03/15/2020   HCT 22.7 (L) 03/15/2020   MCV 87.6 03/15/2020   PLT 86 (L) 03/15/2020   Lab Results  Component Value Date   IRON 68 03/15/2020   TIBC 295 03/15/2020   IRONPCTSAT 23 03/15/2020   Lab Results  Component Value Date   FERRITIN 56 03/15/2020     STUDIES: DG Chest 2 View  Result Date: 02/23/2020 CLINICAL DATA:  Pain following fall EXAM: CHEST - 2 VIEW COMPARISON:  Feb 03, 2020 FINDINGS: Lungs are clear. Heart is mildly enlarged with pulmonary vascularity normal. No adenopathy. No pneumothorax. No evident bone lesions. IMPRESSION: Mild cardiomegaly.  No edema or consolidation. Electronically Signed   By: Lowella Grip III M.D.   On: 02/23/2020 14:56   US Venous Img Lower Bilateral  Result Date: 02/23/2020 CLINICAL DATA:  [place] yesterday, bilateral leg pain and edema EXAM: BILATERAL LOWER EXTREMITY VENOUS DOPPLER ULTRASOUND TECHNIQUE: Gray-scale sonography with compression, as well as color and duplex ultrasound, were performed to evaluate the deep venous  system(s) from the level of the common femoral vein through the popliteal and proximal calf veins. COMPARISON:  02/03/2020 FINDINGS: VENOUS Normal compressibility of the common femoral, superficial femoral, and popliteal veins, as well as the visualized calf veins. Visualized portions of profunda femoral vein and great saphenous vein unremarkable. No filling defects to suggest DVT on grayscale or color Doppler imaging. Doppler waveforms show normal direction of venous flow, normal respiratory plasticity and response to augmentation. Limited views of the contralateral common femoral vein are unremarkable. OTHER None. Limitations: none IMPRESSION: Negative. Electronically Signed   By: Randa Ngo M.D.   On: 02/23/2020 17:21   DG Chest Port 1 View  Result Date: 03/10/2020 CLINICAL DATA:  Shortness of breath. EXAM: PORTABLE CHEST 1 VIEW COMPARISON:  02/23/2020 and prior radiographs FINDINGS: Cardiomegaly and mild peribronchial thickening again noted. There is no evidence of focal airspace disease, pulmonary edema, suspicious pulmonary nodule/mass, pleural effusion, or pneumothorax. No acute bony abnormalities are identified. IMPRESSION: Cardiomegaly without evidence of acute cardiopulmonary disease. Electronically Signed   By: Margarette Canada M.D.   On: 03/10/2020 21:24   DG Femur Min 2 Views Left  Result Date: 02/23/2020 CLINICAL DATA:  Pain following fall EXAM: LEFT FEMUR 2 VIEWS COMPARISON:  None. FINDINGS: Frontal and lateral views obtained. No fracture or dislocation. No abnormal periosteal reaction. There is slight narrowing of the left hip joint. No knee joint effusion. There are multiple foci of arterial vascular calcification. IMPRESSION: No fracture or dislocation. Mild narrowing left hip joint. Multiple foci of arterial vascular calcification. Electronically Signed   By: Lowella Grip III M.D.   On: 02/23/2020 14:57    ASSESSMENT: Anemia, thrombocytopenia  PLAN:    1.  Anemia: Likely  secondary to chronic renal insufficiency.  Patient also has known AV malformations in his stomach that may be contributing.  Patient's most recent EGD was on August 08, 2018.  Patient's hemoglobin remains significantly reduced, although improved over the last week when he is he received 1 unit of packed red blood cells.  He does not require IV iron, but will proceed with 40,000 units Retacrit today.  Return to clinic in 3 weeks with repeat laboratory work, further evaluation, and continuation of treatment if his hemoglobin remains below 10.0.  2.  Thrombocytopenia: Chronic and unchanged.  Platelet count is 86 today. 3.  Chronic renal insufficiency: Chronic and unchanged.  Continue monitoring and treatment per nephrology.  I spent a total of 30 minutes reviewing chart  data, face-to-face evaluation with the patient, counseling and coordination of care as detailed above.    Patient expressed understanding and was in agreement with this plan. He also understands that He can call clinic at any time with any questions, concerns, or complaints.    Lloyd Huger, MD   03/16/2020 6:15 AM

## 2020-03-14 ENCOUNTER — Encounter: Payer: Self-pay | Admitting: Oncology

## 2020-03-14 NOTE — Progress Notes (Signed)
Patient here today for follow up. States last Thursday he had to be taken to hospital for blood transfusion. He is currently in rehab and states he will be released Wednesday. He states he fell about 5 weeks ago and broke some ribs and states he has some pain when moving. He denies other concerns at this time.

## 2020-03-15 ENCOUNTER — Inpatient Hospital Stay (HOSPITAL_BASED_OUTPATIENT_CLINIC_OR_DEPARTMENT_OTHER): Payer: Medicare Other | Admitting: Oncology

## 2020-03-15 ENCOUNTER — Inpatient Hospital Stay: Payer: Medicare Other | Attending: Oncology

## 2020-03-15 ENCOUNTER — Inpatient Hospital Stay: Payer: Medicare Other

## 2020-03-15 ENCOUNTER — Encounter: Payer: Self-pay | Admitting: Oncology

## 2020-03-15 ENCOUNTER — Other Ambulatory Visit: Payer: Self-pay

## 2020-03-15 VITALS — BP 134/49 | HR 65 | Temp 96.8°F | Resp 20 | Wt 283.4 lb

## 2020-03-15 DIAGNOSIS — D500 Iron deficiency anemia secondary to blood loss (chronic): Secondary | ICD-10-CM

## 2020-03-15 DIAGNOSIS — D631 Anemia in chronic kidney disease: Secondary | ICD-10-CM | POA: Insufficient documentation

## 2020-03-15 DIAGNOSIS — N184 Chronic kidney disease, stage 4 (severe): Secondary | ICD-10-CM | POA: Diagnosis not present

## 2020-03-15 DIAGNOSIS — K7460 Unspecified cirrhosis of liver: Secondary | ICD-10-CM

## 2020-03-15 DIAGNOSIS — D649 Anemia, unspecified: Secondary | ICD-10-CM

## 2020-03-15 LAB — CBC WITH DIFFERENTIAL/PLATELET
Abs Immature Granulocytes: 0.02 10*3/uL (ref 0.00–0.07)
Basophils Absolute: 0 10*3/uL (ref 0.0–0.1)
Basophils Relative: 0 %
Eosinophils Absolute: 0 10*3/uL (ref 0.0–0.5)
Eosinophils Relative: 0 %
HCT: 22.7 % — ABNORMAL LOW (ref 39.0–52.0)
Hemoglobin: 7.6 g/dL — ABNORMAL LOW (ref 13.0–17.0)
Immature Granulocytes: 1 %
Lymphocytes Relative: 12 %
Lymphs Abs: 0.5 10*3/uL — ABNORMAL LOW (ref 0.7–4.0)
MCH: 29.3 pg (ref 26.0–34.0)
MCHC: 33.5 g/dL (ref 30.0–36.0)
MCV: 87.6 fL (ref 80.0–100.0)
Monocytes Absolute: 0.3 10*3/uL (ref 0.1–1.0)
Monocytes Relative: 7 %
Neutro Abs: 3.3 10*3/uL (ref 1.7–7.7)
Neutrophils Relative %: 80 %
Platelets: 86 10*3/uL — ABNORMAL LOW (ref 150–400)
RBC: 2.59 MIL/uL — ABNORMAL LOW (ref 4.22–5.81)
RDW: 18.3 % — ABNORMAL HIGH (ref 11.5–15.5)
WBC: 4.1 10*3/uL (ref 4.0–10.5)
nRBC: 0 % (ref 0.0–0.2)

## 2020-03-15 LAB — IRON AND TIBC
Iron: 68 ug/dL (ref 45–182)
Saturation Ratios: 23 % (ref 17.9–39.5)
TIBC: 295 ug/dL (ref 250–450)
UIBC: 227 ug/dL

## 2020-03-15 LAB — FERRITIN: Ferritin: 56 ng/mL (ref 24–336)

## 2020-03-15 LAB — AMMONIA: Ammonia: 110 umol/L — ABNORMAL HIGH (ref 9–35)

## 2020-03-15 MED ORDER — EPOETIN ALFA-EPBX 10000 UNIT/ML IJ SOLN
10000.0000 [IU] | Freq: Once | INTRAMUSCULAR | Status: AC
  Administered 2020-03-15: 10000 [IU] via SUBCUTANEOUS
  Filled 2020-03-15: qty 1

## 2020-03-16 ENCOUNTER — Inpatient Hospital Stay: Payer: Medicare Other

## 2020-03-16 ENCOUNTER — Inpatient Hospital Stay: Payer: Medicare Other | Admitting: Oncology

## 2020-03-17 ENCOUNTER — Other Ambulatory Visit (HOSPITAL_COMMUNITY): Payer: Self-pay

## 2020-03-17 ENCOUNTER — Encounter (HOSPITAL_COMMUNITY): Payer: Self-pay

## 2020-03-17 NOTE — Progress Notes (Signed)
Bgl: 108   Today had first home visit with Jenny Reichmann.  He appears to be doing well since at home.  Discharged yesterday from [facility].  He is getting around by walker.  He is aware of medical problems and medications he takes.  He places his meds in a med box weekly.  He has his PCP working on his diabetes medications to get the cost down.  He states all his other medications are affordable.  They are getting him on a program for his diabetes meds.  He is eating more at home but healthier foods.  He states he watches high sodium and carbs.  He does have his cheat days occassionally.   He has home health that is coming to wrap his legs in about an hour.  He has not weighed since been home, he states will start back tomorrow.  He has everything for daily living.  He is aware of up coming appts. He denies any problems today.  He states has lost about 40 lbs and has about 50 lbs more to go.  He states still has a lot of fluid but legs are a lot better than they were. 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  Will continue to visit for heart failure.   [place] [phone]

## 2020-04-06 ENCOUNTER — Ambulatory Visit: Payer: Medicare Other | Admitting: Family

## 2020-04-07 ENCOUNTER — Other Ambulatory Visit: Payer: Self-pay

## 2020-04-07 ENCOUNTER — Ambulatory Visit: Payer: Medicare Other | Admitting: Family

## 2020-04-07 ENCOUNTER — Other Ambulatory Visit: Payer: Self-pay | Admitting: Emergency Medicine

## 2020-04-07 ENCOUNTER — Other Ambulatory Visit: Payer: Self-pay | Admitting: Oncology

## 2020-04-07 ENCOUNTER — Inpatient Hospital Stay: Payer: Medicare Other | Attending: Oncology

## 2020-04-07 DIAGNOSIS — D500 Iron deficiency anemia secondary to blood loss (chronic): Secondary | ICD-10-CM

## 2020-04-07 DIAGNOSIS — D649 Anemia, unspecified: Secondary | ICD-10-CM | POA: Diagnosis present

## 2020-04-07 DIAGNOSIS — D696 Thrombocytopenia, unspecified: Secondary | ICD-10-CM | POA: Insufficient documentation

## 2020-04-07 LAB — CBC WITH DIFFERENTIAL/PLATELET
Abs Immature Granulocytes: 0.05 10*3/uL (ref 0.00–0.07)
Basophils Absolute: 0 10*3/uL (ref 0.0–0.1)
Basophils Relative: 0 %
Eosinophils Absolute: 0 10*3/uL (ref 0.0–0.5)
Eosinophils Relative: 0 %
HCT: 20.2 % — ABNORMAL LOW (ref 39.0–52.0)
Hemoglobin: 6.7 g/dL — ABNORMAL LOW (ref 13.0–17.0)
Immature Granulocytes: 1 %
Lymphocytes Relative: 7 %
Lymphs Abs: 0.5 10*3/uL — ABNORMAL LOW (ref 0.7–4.0)
MCH: 29.9 pg (ref 26.0–34.0)
MCHC: 33.2 g/dL (ref 30.0–36.0)
MCV: 90.2 fL (ref 80.0–100.0)
Monocytes Absolute: 0.5 10*3/uL (ref 0.1–1.0)
Monocytes Relative: 6 %
Neutro Abs: 6.5 10*3/uL (ref 1.7–7.7)
Neutrophils Relative %: 86 %
Platelets: 97 10*3/uL — ABNORMAL LOW (ref 150–400)
RBC: 2.24 MIL/uL — ABNORMAL LOW (ref 4.22–5.81)
RDW: 17.3 % — ABNORMAL HIGH (ref 11.5–15.5)
WBC: 7.6 10*3/uL (ref 4.0–10.5)
nRBC: 0 % (ref 0.0–0.2)

## 2020-04-07 LAB — PREPARE RBC (CROSSMATCH)

## 2020-04-08 ENCOUNTER — Inpatient Hospital Stay (HOSPITAL_BASED_OUTPATIENT_CLINIC_OR_DEPARTMENT_OTHER): Payer: Medicare Other | Admitting: Oncology

## 2020-04-08 ENCOUNTER — Other Ambulatory Visit: Payer: Self-pay

## 2020-04-08 ENCOUNTER — Inpatient Hospital Stay: Payer: Medicare Other

## 2020-04-08 DIAGNOSIS — D500 Iron deficiency anemia secondary to blood loss (chronic): Secondary | ICD-10-CM

## 2020-04-08 DIAGNOSIS — D649 Anemia, unspecified: Secondary | ICD-10-CM | POA: Diagnosis not present

## 2020-04-08 MED ORDER — ACETAMINOPHEN 325 MG PO TABS
650.0000 mg | ORAL_TABLET | Freq: Once | ORAL | Status: AC
  Administered 2020-04-08: 650 mg via ORAL
  Filled 2020-04-08: qty 2

## 2020-04-08 MED ORDER — SODIUM CHLORIDE 0.9% IV SOLUTION
250.0000 mL | Freq: Once | INTRAVENOUS | Status: AC
  Administered 2020-04-08: 250 mL via INTRAVENOUS
  Filled 2020-04-08: qty 250

## 2020-04-08 MED ORDER — DIPHENHYDRAMINE HCL 50 MG/ML IJ SOLN
25.0000 mg | Freq: Once | INTRAMUSCULAR | Status: AC
  Administered 2020-04-08: 25 mg via INTRAVENOUS
  Filled 2020-04-08: qty 1

## 2020-04-09 LAB — BPAM RBC
Blood Product Expiration Date: 202107102359
ISSUE DATE / TIME: 202107091028
Unit Type and Rh: 600

## 2020-04-09 LAB — TYPE AND SCREEN
ABO/RH(D): A NEG
Antibody Screen: NEGATIVE
Unit division: 0

## 2020-04-09 NOTE — Progress Notes (Signed)
[place]  [phone] [phone]  ID: [name] OB: September 05, 1951  MR#: [id number]  XBL#:[id number]  Patient Care Team: Leonel Ramsay, MD as PCP - General (Infectious Diseases) Baxter Hire, MD (Internal Medicine) Lloyd Huger, MD as Consulting Physician (Oncology)  CHIEF COMPLAINT: Anemia, thrombocytopenia  INTERVAL HISTORY: Patient returns to clinic today as an add-on after recent laboratory work revealed a hemoglobin of 6.7.  He has noticed increasing weakness and fatigue.  He also has multiple chronic medical problems.  He has no neurologic complaints.  He denies any recent fevers or illnesses.  He has a fair appetite, but denies weight loss.  He denies any chest pain, shortness of breath, cough, or hemoptysis. He denies any nausea, vomiting, constipation, or diarrhea.  He has no melena or hematochezia. He has no urinary complaints.  Patient offers no further specific complaints today.  REVIEW OF SYSTEMS:   Review of Systems  Constitutional: Positive for malaise/fatigue. Negative for fever and weight loss.  Respiratory: Negative.  Negative for cough, hemoptysis and shortness of breath.   Cardiovascular: Negative.  Negative for chest pain and leg swelling.  Gastrointestinal: Negative.  Negative for abdominal pain, blood in stool and melena.  Genitourinary: Negative.  Negative for hematuria.  Musculoskeletal: Negative.  Negative for back pain.  Skin: Negative.  Negative for rash.  Neurological: Positive for weakness. Negative for sensory change, focal weakness and headaches.  Psychiatric/Behavioral: Negative.  The patient is not nervous/anxious.     As per HPI. Otherwise, a complete review of systems is negative.  PAST MEDICAL HISTORY: Past Medical History:  Diagnosis Date  . Anemia   . Anxiety    controlled;   . Arthritis   . AVM (arteriovenous malformation) of stomach, acquired with hemorrhage   . Barrett's esophagus    . CHF (congestive heart failure) ([place])   . Chronic kidney disease    renal infufficiency  . Cirrhosis ([place])   . Depression    controlled;   Marland Kitchen Diabetes mellitus without complication ([place])    not controlled, taking insulin but sugar continues to run high;   . Edema   . Esophageal varices ([place])   . GAVE (gastric antral vascular ectasia)   . GERD (gastroesophageal reflux disease)   . History of hiatal hernia   . Hyperlipidemia   . Hypertension    controlled well;   . Liver cirrhosis ([place])   . Nephropathy, diabetic ([place])   . Obesity   . Pancytopenia ([place])   . Polyp, stomach    with chronic blood loss  . Sleep apnea    does not wear a cpap, Medicare would not pay for it   . Venous stasis dermatitis of both lower extremities   . Venous stasis of both lower extremities    with cellulitis    PAST SURGICAL HISTORY: Past Surgical History:  Procedure Laterality Date  . ESOPHAGOGASTRODUODENOSCOPY N/A 02/09/2015   Procedure: ESOPHAGOGASTRODUODENOSCOPY (EGD);  Surgeon: Manya Silvas, MD;  Location: [facility] ENDOSCOPY;  Service: Endoscopy;  Laterality: N/A;  . ESOPHAGOGASTRODUODENOSCOPY N/A 07/22/2015   Procedure: ESOPHAGOGASTRODUODENOSCOPY (EGD);  Surgeon: Manya Silvas, MD;  Location: [facility] ENDOSCOPY;  Service: Endoscopy;  Laterality: N/A;  . ESOPHAGOGASTRODUODENOSCOPY N/A 06/26/2017   Procedure: ESOPHAGOGASTRODUODENOSCOPY (EGD);  Surgeon: Manya Silvas, MD;  Location: [facility] ENDOSCOPY;  Service: Endoscopy;  Laterality: N/A;  . ESOPHAGOGASTRODUODENOSCOPY N/A 06/27/2017   Procedure: ESOPHAGOGASTRODUODENOSCOPY (EGD);  Surgeon: Manya Silvas, MD;  Location: [facility] ENDOSCOPY;  Service: Endoscopy;  Laterality:  N/A;  . ESOPHAGOGASTRODUODENOSCOPY N/A 06/28/2017   Procedure: ESOPHAGOGASTRODUODENOSCOPY (EGD);  Surgeon: Manya Silvas, MD;  Location: [facility] ENDOSCOPY;  Service: Endoscopy;  Laterality: N/A;  . ESOPHAGOGASTRODUODENOSCOPY Bilateral 10/11/2017   Procedure:  ESOPHAGOGASTRODUODENOSCOPY (EGD);  Surgeon: Manya Silvas, MD;  Location: [facility] ENDOSCOPY;  Service: Endoscopy;  Laterality: Bilateral;  . ESOPHAGOGASTRODUODENOSCOPY N/A 12/04/2017   Procedure: ESOPHAGOGASTRODUODENOSCOPY (EGD);  Surgeon: Manya Silvas, MD;  Location: [facility] ENDOSCOPY;  Service: Endoscopy;  Laterality: N/A;  . ESOPHAGOGASTRODUODENOSCOPY N/A 03/06/2019   Procedure: ESOPHAGOGASTRODUODENOSCOPY (EGD);  Surgeon: [place], Benay Pike, MD;  Location: [facility] ENDOSCOPY;  Service: Gastroenterology;  Laterality: N/A;  . ESOPHAGOGASTRODUODENOSCOPY (EGD) WITH PROPOFOL N/A 07/20/2015   Procedure: ESOPHAGOGASTRODUODENOSCOPY (EGD) WITH PROPOFOL;  Surgeon: Manya Silvas, MD;  Location: [facility] ENDOSCOPY;  Service: Endoscopy;  Laterality: N/A;  . ESOPHAGOGASTRODUODENOSCOPY (EGD) WITH PROPOFOL N/A 09/16/2015   Procedure: ESOPHAGOGASTRODUODENOSCOPY (EGD) WITH PROPOFOL;  Surgeon: Manya Silvas, MD;  Location: [facility] ENDOSCOPY;  Service: Endoscopy;  Laterality: N/A;  . ESOPHAGOGASTRODUODENOSCOPY (EGD) WITH PROPOFOL N/A 03/16/2016   Procedure: ESOPHAGOGASTRODUODENOSCOPY (EGD) WITH PROPOFOL;  Surgeon: Manya Silvas, MD;  Location: [facility] ENDOSCOPY;  Service: Endoscopy;  Laterality: N/A;  . ESOPHAGOGASTRODUODENOSCOPY (EGD) WITH PROPOFOL N/A 09/14/2016   Procedure: ESOPHAGOGASTRODUODENOSCOPY (EGD) WITH PROPOFOL;  Surgeon: Manya Silvas, MD;  Location: [facility] ENDOSCOPY;  Service: Endoscopy;  Laterality: N/A;  . ESOPHAGOGASTRODUODENOSCOPY (EGD) WITH PROPOFOL N/A 11/05/2016   Procedure: ESOPHAGOGASTRODUODENOSCOPY (EGD) WITH PROPOFOL;  Surgeon: Manya Silvas, MD;  Location: [facility] ENDOSCOPY;  Service: Endoscopy;  Laterality: N/A;  . ESOPHAGOGASTRODUODENOSCOPY (EGD) WITH PROPOFOL N/A 02/06/2017   Procedure: ESOPHAGOGASTRODUODENOSCOPY (EGD) WITH PROPOFOL;  Surgeon: Manya Silvas, MD;  Location: [facility] ENDOSCOPY;  Service: Endoscopy;  Laterality: N/A;  . ESOPHAGOGASTRODUODENOSCOPY (EGD) WITH PROPOFOL N/A 04/24/2017    Procedure: ESOPHAGOGASTRODUODENOSCOPY (EGD) WITH PROPOFOL;  Surgeon: Manya Silvas, MD;  Location: [facility] ENDOSCOPY;  Service: Endoscopy;  Laterality: N/A;  . ESOPHAGOGASTRODUODENOSCOPY (EGD) WITH PROPOFOL N/A 07/31/2017   Procedure: ESOPHAGOGASTRODUODENOSCOPY (EGD) WITH PROPOFOL;  Surgeon: Manya Silvas, MD;  Location: [facility] ENDOSCOPY;  Service: Endoscopy;  Laterality: N/A;  . ESOPHAGOGASTRODUODENOSCOPY (EGD) WITH PROPOFOL N/A 08/08/2018   Procedure: ESOPHAGOGASTRODUODENOSCOPY (EGD) WITH PROPOFOL;  Surgeon: Manya Silvas, MD;  Location: [facility] ENDOSCOPY;  Service: Endoscopy;  Laterality: N/A;  . GIVENS CAPSULE STUDY N/A 12/05/2017   Procedure: GIVENS CAPSULE STUDY;  Surgeon: Manya Silvas, MD;  Location: [facility] ENDOSCOPY;  Service: Endoscopy;  Laterality: N/A;  . TONSILLECTOMY    . TONSILLECTOMY AND ADENOIDECTOMY    . ULNAR NERVE TRANSPOSITION    . UVULOPALATOPHARYNGOPLASTY      FAMILY HISTORY: Family History  Problem Relation Age of Onset  . Diabetes Other   . Transient ischemic attack Father   . CAD Father     ADVANCED DIRECTIVES (Y/N):  N  HEALTH MAINTENANCE: Social History   Tobacco Use  . Smoking status: Former Smoker    Years: 20.00    Types: Cigars, Cigarettes  . Smokeless tobacco: Never Used  Vaping Use  . Vaping Use: Never used  Substance Use Topics  . Alcohol use: No    Comment: stopped 15 years ago  . Drug use: No     Colonoscopy:  PAP:  Bone density:  Lipid panel:  No Known Allergies  Current Outpatient Medications  Medication Sig Dispense Refill  . amLODipine (NORVASC) 5 MG tablet Take 1 tablet (5 mg total) by mouth daily. 30 tablet 0  . atorvastatin (LIPITOR) 20 MG tablet Take 20 mg by mouth daily.    Marland Kitchen  feeding supplement, ENSURE ENLIVE, (ENSURE ENLIVE) LIQD Take 237 mLs by mouth 2 (two) times daily between meals. 14400 mL 0  . furosemide (LASIX) 40 MG tablet Take 1 tablet by mouth 2 (two) times daily.    . hydrALAZINE (APRESOLINE) 25 MG tablet  Take 1 tablet (25 mg total) by mouth in the morning and at bedtime.    . insulin aspart (NOVOLOG) 100 UNIT/ML injection Inject 4 Units into the skin 3 (three) times daily with meals. 10 mL 0  . insulin detemir (LEVEMIR) 100 UNIT/ML injection Inject 0.08 mLs (8 Units total) into the skin daily. 10 mL 0  . Ipratropium-Albuterol (COMBIVENT RESPIMAT) 20-100 MCG/ACT AERS respimat Inhale 1 puff into the lungs every 6 (six) hours. 4 g 0  . iron polysaccharides (NIFEREX) 150 MG capsule Take 1 capsule (150 mg total) by mouth daily. 30 capsule 0  . lactulose (CHRONULAC) 10 GM/15ML solution Take 45 mLs (30 g total) by mouth 2 (two) times daily. 1892 mL 0  . losartan (COZAAR) 25 MG tablet Take 25 mg by mouth daily.    . metolazone (ZAROXOLYN) 5 MG tablet Take 5 mg by mouth 2 (two) times a week.    . Multiple Vitamin (MULTIVITAMIN) tablet Take 1 tablet by mouth daily.    Marland Kitchen NOVOLOG MIX 70/30 FLEXPEN (70-30) 100 UNIT/ML FlexPen     . pantoprazole (PROTONIX) 40 MG tablet Take 1 tablet (40 mg total) by mouth 2 (two) times daily. 60 tablet 11  . rifaximin (XIFAXAN) 550 MG TABS tablet Take 1 tablet (550 mg total) by mouth 2 (two) times daily. 60 tablet 0  . sodium chloride (OCEAN) 0.65 % SOLN nasal spray Place 1 spray into both nostrils as needed for congestion (nose irritation). 60 mL 0  . spironolactone (ALDACTONE) 25 MG tablet Take 1 tablet (25 mg total) by mouth daily. 30 tablet 0  . torsemide (DEMADEX) 20 MG tablet Take 2 tablets (40 mg total) by mouth daily. 30 tablet 0  . torsemide (DEMADEX) 20 MG tablet Take 20 mg by mouth daily.    . traMADol (ULTRAM) 50 MG tablet Take 1 tablet (50 mg total) by mouth every 8 (eight) hours as needed for moderate pain. 10 tablet 0   No current facility-administered medications for this visit.    OBJECTIVE: There were no vitals filed for this visit.   There is no height or weight on file to calculate BMI.    ECOG FS:0 - Asymptomatic  General: Well-developed,  well-nourished, no acute distress. Eyes: Pink conjunctiva, anicteric sclera. HEENT: Normocephalic, moist mucous membranes. Lungs: No audible wheezing or coughing. Heart: Regular rate and rhythm. Abdomen: Soft, nontender, no obvious distention. Musculoskeletal: No edema, cyanosis, or clubbing. Neuro: Alert, answering all questions appropriately. Cranial nerves grossly intact. Skin: No rashes or petechiae noted. Psych: Normal affect.  LAB RESULTS:  Lab Results  Component Value Date   NA 133 (L) 03/10/2020   K 4.1 03/10/2020   CL 96 (L) 03/10/2020   CO2 28 03/10/2020   GLUCOSE 372 (H) 03/10/2020   BUN 53 (H) 03/10/2020   CREATININE 3.01 (H) 03/10/2020   CALCIUM 8.8 (L) 03/10/2020   PROT 6.6 02/24/2020   ALBUMIN 3.1 (L) 02/24/2020   AST 22 02/24/2020   ALT 21 02/24/2020   ALKPHOS 70 02/24/2020   BILITOT 1.0 02/24/2020   GFRNONAA 20 (L) 03/10/2020   GFRAA 24 (L) 03/10/2020    Lab Results  Component Value Date   WBC 7.6 04/07/2020   NEUTROABS 6.5  04/07/2020   HGB 6.7 (L) 04/07/2020   HCT 20.2 (L) 04/07/2020   MCV 90.2 04/07/2020   PLT 97 (L) 04/07/2020   Lab Results  Component Value Date   IRON 68 03/15/2020   TIBC 295 03/15/2020   IRONPCTSAT 23 03/15/2020   Lab Results  Component Value Date   FERRITIN 56 03/15/2020     STUDIES: DG Chest Port 1 View  Result Date: 03/10/2020 CLINICAL DATA:  Shortness of breath. EXAM: PORTABLE CHEST 1 VIEW COMPARISON:  02/23/2020 and prior radiographs FINDINGS: Cardiomegaly and mild peribronchial thickening again noted. There is no evidence of focal airspace disease, pulmonary edema, suspicious pulmonary nodule/mass, pleural effusion, or pneumothorax. No acute bony abnormalities are identified. IMPRESSION: Cardiomegaly without evidence of acute cardiopulmonary disease. Electronically Signed   By: Margarette Canada M.D.   On: 03/10/2020 21:24    ASSESSMENT: Anemia, thrombocytopenia  PLAN:    1.  Anemia: Multifactorial, possibly  secondary to chronic renal insufficiency as well as GI blood loss from his known AV malformations.  His most recent EGD was on August 08, 2018.  Patient's hemoglobin has decreased and is now 6.7.  Proceed with 1 unit of packed red blood cells today.  Patient also may benefit from Retacrit and or Feraheme in the future.  Return to clinic in 4 weeks for further evaluation and continuation of treatment.   2.  Thrombocytopenia: Chronic and unchanged. 3.  Chronic renal insufficiency: Chronic and unchanged.  Continue monitoring and treatment per nephrology.  I spent a total of 30 minutes reviewing chart data, face-to-face evaluation with the patient, counseling and coordination of care as detailed above.   Patient expressed understanding and was in agreement with this plan. He also understands that He can call clinic at any time with any questions, concerns, or complaints.    Lloyd Huger, MD   04/09/2020 1:11 PM

## 2020-04-15 ENCOUNTER — Telehealth: Payer: Self-pay | Admitting: Family

## 2020-04-15 DIAGNOSIS — I5032 Chronic diastolic (congestive) heart failure: Secondary | ICD-10-CM

## 2020-04-15 NOTE — Telephone Encounter (Signed)
Patient has had 8 pound weight gain this week and worsening shortness of breath. HF provider out of the office and neither [facility] or [facility] cardiology can see patient today so patient was advised to go to the ED for evaluation.   [facility] did schedule NP appointment for 04/20/20 and referral was placed.

## 2020-04-18 ENCOUNTER — Telehealth (HOSPITAL_COMMUNITY): Payer: Self-pay

## 2020-04-18 NOTE — Telephone Encounter (Signed)
Today contacted Corey West to check on him.  He had a weight gain last week and [name] with HF clinic advised him to go to ED.  He did not go.  He states weight is 306 lbs this morning, he is up 20 plus lbs.  He seen [name] today, his PCP, she is going to send him to kidney doctor and advised him to take metolazone and torsemide 30 minutes later.  He is taking that today.  He also stated she is ordering him IV lasix which will allow him to urinate the fluid off.  He was real short with me, attempted to schedule an appt and he states waiting to see when his appt will be with kidney doctor.  Will check back with him tomorrow and will stop by if home.    [place] [phone]

## 2020-04-20 ENCOUNTER — Ambulatory Visit (INDEPENDENT_AMBULATORY_CARE_PROVIDER_SITE_OTHER): Payer: Medicare Other | Admitting: Internal Medicine

## 2020-04-20 ENCOUNTER — Other Ambulatory Visit: Payer: Self-pay

## 2020-04-20 ENCOUNTER — Encounter: Payer: Self-pay | Admitting: Internal Medicine

## 2020-04-20 VITALS — BP 140/60 | HR 67 | Ht 69.0 in | Wt 307.4 lb

## 2020-04-20 DIAGNOSIS — R609 Edema, unspecified: Secondary | ICD-10-CM | POA: Diagnosis not present

## 2020-04-20 DIAGNOSIS — I10 Essential (primary) hypertension: Secondary | ICD-10-CM

## 2020-04-20 DIAGNOSIS — N184 Chronic kidney disease, stage 4 (severe): Secondary | ICD-10-CM

## 2020-04-20 DIAGNOSIS — I5032 Chronic diastolic (congestive) heart failure: Secondary | ICD-10-CM | POA: Diagnosis not present

## 2020-04-20 DIAGNOSIS — D649 Anemia, unspecified: Secondary | ICD-10-CM

## 2020-04-20 DIAGNOSIS — I480 Paroxysmal atrial fibrillation: Secondary | ICD-10-CM | POA: Diagnosis not present

## 2020-04-20 NOTE — Progress Notes (Signed)
New Outpatient Visit Date: 04/20/2020  Primary Care Provider: Leonel Ramsay, MD [place],  [place] [id number]  Chief Complaint: Swelling  HPI:  [name] is a 69 y.o. male who is being seen today for the evaluation of heart failure at the request of Darylene Price, NP. He has a history of chronic edema and HFpEF, chronic atrial fibrillation, hypertension, hyperlipidemia, diabetes mellitus, CKD stage 4, cirrhosis complicated by portal hypertension. He was seen during a hospitalization in May by Dr. Ubaldo Glassing of [facility].  He was admitted at that time with worsening leg edema and shortness of breath.  He was diuresed; anticoagulation was deferred given history of esophageal varices with portal hypertensive gastropathy.  He was seen in follow-up by Ms. Jackelyn Hoehn most recently on 03/10/20, at which time he complained of "minimal shortness of breath upon moderate exertion."  Previously noted scrotal edema was improving.  He had just been started on metolazone by nephrology in addition to torsemide and spironolactone.  However, Dr. Ola Spurr recently transitioned the patient to furosemide 120 mg daily and metolazone 5 mg on Mondays Wednesdays and Fridays.  Unfortunately, Mr. Deakin is confused about his diuretic regimen and has been taking torsemide daily for 3 days in a row and then skipping 1 to 2 days.  He is unclear if he should also skip furosemide on those days.  Mr. Perrault notes that his swelling has worsened over the last few weeks.  He says that he felt best when his weight was around 270 pounds at home but was 304 this morning.  He notes some intermittent shortness of breath, not as severe as what he experienced leading up to his hospitalization in May.  He denies orthopnea and PND as well as palpitations, lightheadedness, and chest pain.  He has not had any significant  bleeding.  --------------------------------------------------------------------------------------------------  Cardiovascular History & Procedures: Cardiovascular Problems:  HFpEF  Paroxysmal atrial fibrillation  Risk Factors:  Hypertension, hyperlipidemia, diabetes mellitus, morbid obesity, and age greater than 19  Cath/PCI:  None  CV Surgery:  None  EP Procedures and Devices:  None  Non-Invasive Evaluation(s):  TTE (12/16/2019): Moderate LVH with LVEF 60-65%.  Grade 1 diastolic dysfunction.  Mild right ventricular enlargement with normal contraction.  Mild left atrial enlargement.  Recent CV Pertinent Labs: Lab Results  Component Value Date   CHOL 141 12/15/2019   HDL 37 (L) 12/15/2019   LDLCALC 93 12/15/2019   TRIG 56 12/15/2019   CHOLHDL 3.8 12/15/2019   INR 1.5 (H) 02/24/2020   INR 1.3 08/13/2014   BNP 167.4 (H) 03/10/2020   K 4.1 03/10/2020   K 3.4 (L) 09/14/2014   MG 1.6 (L) 02/24/2020   MG 2.1 05/11/2014   BUN 53 (H) 03/10/2020   BUN 18 09/14/2014   CREATININE 3.01 (H) 03/10/2020   CREATININE 2.05 (H) 09/14/2014    --------------------------------------------------------------------------------------------------  Past Medical History:  Diagnosis Date  . Anemia   . Anxiety    controlled;   . Arthritis   . AVM (arteriovenous malformation) of stomach, acquired with hemorrhage   . Barrett's esophagus   . CHF (congestive heart failure) ([place])   . Chronic kidney disease    renal infufficiency  . Cirrhosis ([place])   . Depression    controlled;   Marland Kitchen Diabetes mellitus without complication ([place])    not controlled, taking insulin but sugar continues to run high;   . Edema   . Esophageal varices ([place])   . GAVE (gastric  antral vascular ectasia)   . GERD (gastroesophageal reflux disease)   . History of hiatal hernia   . Hyperlipidemia   . Hypertension    controlled well;   . Liver cirrhosis ([place])   . Nephropathy, diabetic ([place])   . Obesity   .  Pancytopenia ([place])   . Polyp, stomach    with chronic blood loss  . Sleep apnea    does not wear a cpap, Medicare would not pay for it   . Venous stasis dermatitis of both lower extremities   . Venous stasis of both lower extremities    with cellulitis    Past Surgical History:  Procedure Laterality Date  . ESOPHAGOGASTRODUODENOSCOPY N/A 02/09/2015   Procedure: ESOPHAGOGASTRODUODENOSCOPY (EGD);  Surgeon: Manya Silvas, MD;  Location: [facility] ENDOSCOPY;  Service: Endoscopy;  Laterality: N/A;  . ESOPHAGOGASTRODUODENOSCOPY N/A 07/22/2015   Procedure: ESOPHAGOGASTRODUODENOSCOPY (EGD);  Surgeon: Manya Silvas, MD;  Location: [facility] ENDOSCOPY;  Service: Endoscopy;  Laterality: N/A;  . ESOPHAGOGASTRODUODENOSCOPY N/A 06/26/2017   Procedure: ESOPHAGOGASTRODUODENOSCOPY (EGD);  Surgeon: Manya Silvas, MD;  Location: [facility] ENDOSCOPY;  Service: Endoscopy;  Laterality: N/A;  . ESOPHAGOGASTRODUODENOSCOPY N/A 06/27/2017   Procedure: ESOPHAGOGASTRODUODENOSCOPY (EGD);  Surgeon: Manya Silvas, MD;  Location: [facility] ENDOSCOPY;  Service: Endoscopy;  Laterality: N/A;  . ESOPHAGOGASTRODUODENOSCOPY N/A 06/28/2017   Procedure: ESOPHAGOGASTRODUODENOSCOPY (EGD);  Surgeon: Manya Silvas, MD;  Location: [facility] ENDOSCOPY;  Service: Endoscopy;  Laterality: N/A;  . ESOPHAGOGASTRODUODENOSCOPY Bilateral 10/11/2017   Procedure: ESOPHAGOGASTRODUODENOSCOPY (EGD);  Surgeon: Manya Silvas, MD;  Location: [facility] ENDOSCOPY;  Service: Endoscopy;  Laterality: Bilateral;  . ESOPHAGOGASTRODUODENOSCOPY N/A 12/04/2017   Procedure: ESOPHAGOGASTRODUODENOSCOPY (EGD);  Surgeon: Manya Silvas, MD;  Location: [facility] ENDOSCOPY;  Service: Endoscopy;  Laterality: N/A;  . ESOPHAGOGASTRODUODENOSCOPY N/A 03/06/2019   Procedure: ESOPHAGOGASTRODUODENOSCOPY (EGD);  Surgeon: Toledo, Benay Pike, MD;  Location: [facility] ENDOSCOPY;  Service: Gastroenterology;  Laterality: N/A;  . ESOPHAGOGASTRODUODENOSCOPY (EGD) WITH PROPOFOL N/A 07/20/2015   Procedure:  ESOPHAGOGASTRODUODENOSCOPY (EGD) WITH PROPOFOL;  Surgeon: Manya Silvas, MD;  Location: [facility] ENDOSCOPY;  Service: Endoscopy;  Laterality: N/A;  . ESOPHAGOGASTRODUODENOSCOPY (EGD) WITH PROPOFOL N/A 09/16/2015   Procedure: ESOPHAGOGASTRODUODENOSCOPY (EGD) WITH PROPOFOL;  Surgeon: Manya Silvas, MD;  Location: [facility] ENDOSCOPY;  Service: Endoscopy;  Laterality: N/A;  . ESOPHAGOGASTRODUODENOSCOPY (EGD) WITH PROPOFOL N/A 03/16/2016   Procedure: ESOPHAGOGASTRODUODENOSCOPY (EGD) WITH PROPOFOL;  Surgeon: Manya Silvas, MD;  Location: [facility] ENDOSCOPY;  Service: Endoscopy;  Laterality: N/A;  . ESOPHAGOGASTRODUODENOSCOPY (EGD) WITH PROPOFOL N/A 09/14/2016   Procedure: ESOPHAGOGASTRODUODENOSCOPY (EGD) WITH PROPOFOL;  Surgeon: Manya Silvas, MD;  Location: [facility] ENDOSCOPY;  Service: Endoscopy;  Laterality: N/A;  . ESOPHAGOGASTRODUODENOSCOPY (EGD) WITH PROPOFOL N/A 11/05/2016   Procedure: ESOPHAGOGASTRODUODENOSCOPY (EGD) WITH PROPOFOL;  Surgeon: Manya Silvas, MD;  Location: [facility] ENDOSCOPY;  Service: Endoscopy;  Laterality: N/A;  . ESOPHAGOGASTRODUODENOSCOPY (EGD) WITH PROPOFOL N/A 02/06/2017   Procedure: ESOPHAGOGASTRODUODENOSCOPY (EGD) WITH PROPOFOL;  Surgeon: Manya Silvas, MD;  Location: [facility] ENDOSCOPY;  Service: Endoscopy;  Laterality: N/A;  . ESOPHAGOGASTRODUODENOSCOPY (EGD) WITH PROPOFOL N/A 04/24/2017   Procedure: ESOPHAGOGASTRODUODENOSCOPY (EGD) WITH PROPOFOL;  Surgeon: Manya Silvas, MD;  Location: [facility] ENDOSCOPY;  Service: Endoscopy;  Laterality: N/A;  . ESOPHAGOGASTRODUODENOSCOPY (EGD) WITH PROPOFOL N/A 07/31/2017   Procedure: ESOPHAGOGASTRODUODENOSCOPY (EGD) WITH PROPOFOL;  Surgeon: Manya Silvas, MD;  Location: [facility] ENDOSCOPY;  Service: Endoscopy;  Laterality: N/A;  . ESOPHAGOGASTRODUODENOSCOPY (EGD) WITH PROPOFOL N/A 08/08/2018   Procedure: ESOPHAGOGASTRODUODENOSCOPY (EGD) WITH PROPOFOL;  Surgeon: Manya Silvas, MD;  Location: [facility] ENDOSCOPY;  Service:  Endoscopy;  Laterality:  N/A;  . GIVENS CAPSULE STUDY N/A 12/05/2017   Procedure: GIVENS CAPSULE STUDY;  Surgeon: Manya Silvas, MD;  Location: [facility] ENDOSCOPY;  Service: Endoscopy;  Laterality: N/A;  . TONSILLECTOMY    . TONSILLECTOMY AND ADENOIDECTOMY    . ULNAR NERVE TRANSPOSITION    . UVULOPALATOPHARYNGOPLASTY      Current Meds  Medication Sig  . amLODipine (NORVASC) 5 MG tablet Take 1 tablet (5 mg total) by mouth daily.  Marland Kitchen atorvastatin (LIPITOR) 20 MG tablet Take 20 mg by mouth daily.  . Ferrous Sulfate (IRON) 325 (65 Fe) MG TABS Take by mouth daily.  . furosemide (LASIX) 20 MG tablet Take 60 mg by mouth every Monday, Wednesday, and Friday.  . hydrALAZINE (APRESOLINE) 25 MG tablet Take 1 tablet (25 mg total) by mouth in the morning and at bedtime.  Marland Kitchen lactulose (CHRONULAC) 10 GM/15ML solution Take 45 mLs (30 g total) by mouth 2 (two) times daily.  Marland Kitchen LORazepam (ATIVAN) 0.5 MG tablet Take 0.5 mg by mouth every 8 (eight) hours.  . Multiple Vitamin (MULTIVITAMIN) tablet Take 1 tablet by mouth daily.  Marland Kitchen NOVOLOG MIX 70/30 FLEXPEN (70-30) 100 UNIT/ML FlexPen as directed.   . rifaximin (XIFAXAN) 550 MG TABS tablet Take 1 tablet (550 mg total) by mouth 2 (two) times daily.  . sodium chloride (OCEAN) 0.65 % SOLN nasal spray Place 1 spray into both nostrils as needed for congestion (nose irritation).  Marland Kitchen spironolactone (ALDACTONE) 25 MG tablet Take 1 tablet (25 mg total) by mouth daily.  . traMADol (ULTRAM) 50 MG tablet Take 1 tablet (50 mg total) by mouth every 8 (eight) hours as needed for moderate pain.  . [DISCONTINUED] metolazone (ZAROXOLYN) 2.5 MG tablet Take 2.5 mg by mouth every Monday, Wednesday, and Friday.    Allergies: Patient has no known allergies.  Social History   Tobacco Use  . Smoking status: Former Smoker    Years: 20.00    Types: Cigars, Cigarettes  . Smokeless tobacco: Never Used  Vaping Use  . Vaping Use: Never used  Substance Use Topics  . Alcohol use: No    Comment: stopped 15 years  ago  . Drug use: No    Family History  Problem Relation Age of Onset  . Diabetes Other   . Transient ischemic attack Father   . CAD Father     Review of Systems: A 12-system review of systems was performed and was negative except as noted in the HPI.  --------------------------------------------------------------------------------------------------  Physical Exam: BP 140/60 (BP Location: Right Arm, Patient Position: Sitting, Cuff Size: Large)   Pulse 67   Ht 5\' 9"  (1.753 m)   Wt (!) 307 lb 6 oz (139.4 kg)   SpO2 97%   BMI 45.39 kg/m   General: Morbidly obese man, seated in a wheelchair. HEENT: No conjunctival pallor or scleral icterus. Facemask in place. Neck: No gross JVD or HJR, though body habitus limits evaluation.  No carotid bruit. Lungs: Normal work of breathing.  Faint crackles at the right lung base.  No wheezes.Marland Kitchen Heart: Distant heart sounds.  Regular rate and rhythm without murmurs, rubs, or gallops. Abd: Bowel sounds present. Soft, NT/ND. Ext: 2-3+ chronic appearing lower extremity edema with associated induration. Skin: Superficial abrasion overlying the right shin.  There is erythema along the medial aspect of the left distal thigh and proximal calf without significant warmth. Neuro: CNIII-XII intact. Strength and fine-touch sensation intact in upper and lower extremities bilaterally. Psych: Normal mood and  affect.  EKG: Normal sinus rhythm with first-degree AV block (PR interval 260 ms).  Otherwise, no significant abnormality.  Lab Results  Component Value Date   WBC 7.6 04/07/2020   HGB 6.7 (L) 04/07/2020   HCT 20.2 (L) 04/07/2020   MCV 90.2 04/07/2020   PLT 97 (L) 04/07/2020    Lab Results  Component Value Date   NA 133 (L) 03/10/2020   K 4.1 03/10/2020   CL 96 (L) 03/10/2020   CO2 28 03/10/2020   BUN 53 (H) 03/10/2020   CREATININE 3.01 (H) 03/10/2020   GLUCOSE 372 (H) 03/10/2020   ALT 21 02/24/2020    Lab Results  Component Value Date    CHOL 141 12/15/2019   HDL 37 (L) 12/15/2019   LDLCALC 93 12/15/2019   TRIG 56 12/15/2019   CHOLHDL 3.8 12/15/2019   --------------------------------------------------------------------------------------------------  ASSESSMENT AND PLAN: Massive edema and chronic HFpEF: I suspect that significant lower extremity edema is multifactorial including some element of venous stasis and lymphedema compounded by cirrhosis, chronic kidney disease, and HFpEF.  Mr. Flenner states that he felt best when his weight was around 270 pounds.  He was noted to weigh 283 on 03/15/2020; weight is up to 307 today.  There is some confusion on the patient's part regarding his diuretic regimen.  We have reviewed this today with plans to continue with furosemide 120 mg daily and metolazone 5 mg on Mondays, Wednesdays, and Fridays.  We discussed admission to the hospital for IV diuresis and close monitoring of his renal function, though Mr. Soto wishes to defer this and follow-up with Dr. Ola Spurr tomorrow, as planned.  I would have a low threshold for hospitalization if his edema worsens.  He may benefit from right heart catheterization at some point to better understand his hemodynamics.  Paroxysmal atrial fibrillation: Mr. Gardella is in sinus rhythm today.  He is a poor candidate for anticoagulation given his history of portal hypertension and chronic anemia.  Given that he is in sinus rhythm today with a first-degree AV block, AV nodal blocking agents should also be avoided.  Chronic anemia: Mr. Bloyd received a PRBC transfusion earlier this month.  Follow-up CBC 2 days ago with Dr. Ola Spurr showed that his hemoglobin was back down to 7.1 (6.7 before transfusion).  He denies symptoms today, though I would have a low threshold from further blood transfusions if his hemoglobin has dropped any further.  Labs to be repeated by Dr. Ola Spurr tomorrow.  Chronic kidney disease stage IV: Continue diuresis as above.  Nephrotoxic  agents should be held.  Renal function and potassium will need to be monitored quite closely in the setting of spironolactone, furosemide, and metolazone use.  Hypertension: Blood pressure mildly elevated today.  However, in the setting of recent diuretic changes, I will defer medication adjustments at this time.  Follow-up: Return to clinic in 2 weeks.  We have agreed to cancel upcoming visit in the heart failure clinic.  Greater than 60 minutes were spent on this encounter, including reviewing outside record, interviewing and examining the patient, and providing counseling.  Nelva Bush, MD 04/20/2020 1:24 PM

## 2020-04-20 NOTE — Patient Instructions (Signed)
Medication Instructions:  Your physician recommends that you continue on your current medications as directed. Please refer to the Current Medication list given to you today.  Take Metolazone 5 mg every Mon, Wed, Fri 30 minutes before taking Lasix.  Take Lasix 120 mg daily.  *If you need a refill on your cardiac medications before your next appointment, please call your pharmacy*   Lab Work: None ordered If you have labs (blood work) drawn today and your tests are completely normal, you will receive your results only by: Marland Kitchen MyChart Message (if you have MyChart) OR . A paper copy in the mail If you have any lab test that is abnormal or we need to change your treatment, we will call you to review the results.   Testing/Procedures: None ordered   Follow-Up: At [facility], you and your health needs are our priority.  As part of our continuing mission to provide you with exceptional heart care, we have created designated Provider Care Teams.  These Care Teams include your primary Cardiologist (physician) and Advanced Practice Providers (APPs -  Physician Assistants and Nurse Practitioners) who all work together to provide you with the care you need, when you need it.  We recommend signing up for the patient portal called "MyChart".  Sign up information is provided on this After Visit Summary.  MyChart is used to connect with patients for Virtual Visits (Telemedicine).  Patients are able to view lab/test results, encounter notes, upcoming appointments, etc.  Non-urgent messages can be sent to your provider as well.   To learn more about what you can do with MyChart, go to [email or web address].    Your next appointment:   2 week(s)  The format for your next appointment:   In Person  Provider:    You may see Dr. Saunders Revel or one of the following Advanced Practice Providers on your designated Care Team:    Murray Hodgkins, NP  Christell Faith, PA-C  Marrianne Mood, PA-C    Other  Instructions Keep you upcoming appointment with your primary care physician.

## 2020-04-23 ENCOUNTER — Emergency Department: Payer: Medicare Other

## 2020-04-23 ENCOUNTER — Other Ambulatory Visit: Payer: Self-pay

## 2020-04-23 ENCOUNTER — Inpatient Hospital Stay
Admission: EM | Admit: 2020-04-23 | Discharge: 2020-05-03 | DRG: 871 | Disposition: A | Discharge status: Home Health | Payer: Medicare Other | Attending: Internal Medicine | Admitting: Internal Medicine

## 2020-04-23 ENCOUNTER — Encounter: Payer: Self-pay | Admitting: Emergency Medicine

## 2020-04-23 DIAGNOSIS — I440 Atrioventricular block, first degree: Secondary | ICD-10-CM | POA: Diagnosis present

## 2020-04-23 DIAGNOSIS — K219 Gastro-esophageal reflux disease without esophagitis: Secondary | ICD-10-CM | POA: Diagnosis present

## 2020-04-23 DIAGNOSIS — I130 Hypertensive heart and chronic kidney disease with heart failure and stage 1 through stage 4 chronic kidney disease, or unspecified chronic kidney disease: Secondary | ICD-10-CM | POA: Diagnosis present

## 2020-04-23 DIAGNOSIS — B961 Klebsiella pneumoniae [K. pneumoniae] as the cause of diseases classified elsewhere: Secondary | ICD-10-CM | POA: Diagnosis present

## 2020-04-23 DIAGNOSIS — D649 Anemia, unspecified: Secondary | ICD-10-CM | POA: Diagnosis not present

## 2020-04-23 DIAGNOSIS — Z20822 Contact with and (suspected) exposure to covid-19: Secondary | ICD-10-CM | POA: Diagnosis present

## 2020-04-23 DIAGNOSIS — I872 Venous insufficiency (chronic) (peripheral): Secondary | ICD-10-CM | POA: Diagnosis present

## 2020-04-23 DIAGNOSIS — K766 Portal hypertension: Secondary | ICD-10-CM | POA: Diagnosis present

## 2020-04-23 DIAGNOSIS — G4733 Obstructive sleep apnea (adult) (pediatric): Secondary | ICD-10-CM | POA: Diagnosis present

## 2020-04-23 DIAGNOSIS — D509 Iron deficiency anemia, unspecified: Secondary | ICD-10-CM | POA: Diagnosis present

## 2020-04-23 DIAGNOSIS — N184 Chronic kidney disease, stage 4 (severe): Secondary | ICD-10-CM | POA: Diagnosis present

## 2020-04-23 DIAGNOSIS — J9601 Acute respiratory failure with hypoxia: Secondary | ICD-10-CM | POA: Diagnosis present

## 2020-04-23 DIAGNOSIS — I8510 Secondary esophageal varices without bleeding: Secondary | ICD-10-CM | POA: Diagnosis present

## 2020-04-23 DIAGNOSIS — E785 Hyperlipidemia, unspecified: Secondary | ICD-10-CM | POA: Diagnosis present

## 2020-04-23 DIAGNOSIS — R7881 Bacteremia: Secondary | ICD-10-CM | POA: Diagnosis present

## 2020-04-23 DIAGNOSIS — I890 Lymphedema, not elsewhere classified: Secondary | ICD-10-CM | POA: Diagnosis present

## 2020-04-23 DIAGNOSIS — A419 Sepsis, unspecified organism: Secondary | ICD-10-CM | POA: Diagnosis present

## 2020-04-23 DIAGNOSIS — R0600 Dyspnea, unspecified: Secondary | ICD-10-CM

## 2020-04-23 DIAGNOSIS — I4819 Other persistent atrial fibrillation: Secondary | ICD-10-CM | POA: Diagnosis not present

## 2020-04-23 DIAGNOSIS — E876 Hypokalemia: Secondary | ICD-10-CM | POA: Diagnosis present

## 2020-04-23 DIAGNOSIS — Z6841 Body Mass Index (BMI) 40.0 and over, adult: Secondary | ICD-10-CM | POA: Diagnosis not present

## 2020-04-23 DIAGNOSIS — I5031 Acute diastolic (congestive) heart failure: Secondary | ICD-10-CM | POA: Diagnosis not present

## 2020-04-23 DIAGNOSIS — N5089 Other specified disorders of the male genital organs: Secondary | ICD-10-CM | POA: Diagnosis present

## 2020-04-23 DIAGNOSIS — I5033 Acute on chronic diastolic (congestive) heart failure: Secondary | ICD-10-CM | POA: Diagnosis present

## 2020-04-23 DIAGNOSIS — R300 Dysuria: Secondary | ICD-10-CM | POA: Diagnosis not present

## 2020-04-23 DIAGNOSIS — E1122 Type 2 diabetes mellitus with diabetic chronic kidney disease: Secondary | ICD-10-CM | POA: Diagnosis present

## 2020-04-23 DIAGNOSIS — Z515 Encounter for palliative care: Secondary | ICD-10-CM

## 2020-04-23 DIAGNOSIS — R32 Unspecified urinary incontinence: Secondary | ICD-10-CM | POA: Diagnosis present

## 2020-04-23 DIAGNOSIS — R251 Tremor, unspecified: Secondary | ICD-10-CM | POA: Diagnosis present

## 2020-04-23 DIAGNOSIS — T502X5A Adverse effect of carbonic-anhydrase inhibitors, benzothiadiazides and other diuretics, initial encounter: Secondary | ICD-10-CM | POA: Diagnosis not present

## 2020-04-23 DIAGNOSIS — E871 Hypo-osmolality and hyponatremia: Secondary | ICD-10-CM | POA: Diagnosis not present

## 2020-04-23 DIAGNOSIS — I350 Nonrheumatic aortic (valve) stenosis: Secondary | ICD-10-CM | POA: Diagnosis present

## 2020-04-23 DIAGNOSIS — D631 Anemia in chronic kidney disease: Secondary | ICD-10-CM | POA: Diagnosis present

## 2020-04-23 DIAGNOSIS — E1121 Type 2 diabetes mellitus with diabetic nephropathy: Secondary | ICD-10-CM | POA: Diagnosis not present

## 2020-04-23 DIAGNOSIS — Z7189 Other specified counseling: Secondary | ICD-10-CM | POA: Diagnosis not present

## 2020-04-23 DIAGNOSIS — K7460 Unspecified cirrhosis of liver: Secondary | ICD-10-CM | POA: Diagnosis present

## 2020-04-23 DIAGNOSIS — A4159 Other Gram-negative sepsis: Secondary | ICD-10-CM | POA: Diagnosis present

## 2020-04-23 DIAGNOSIS — R188 Other ascites: Secondary | ICD-10-CM | POA: Diagnosis not present

## 2020-04-23 DIAGNOSIS — R601 Generalized edema: Secondary | ICD-10-CM | POA: Diagnosis not present

## 2020-04-23 DIAGNOSIS — I480 Paroxysmal atrial fibrillation: Secondary | ICD-10-CM | POA: Diagnosis not present

## 2020-04-23 DIAGNOSIS — I248 Other forms of acute ischemic heart disease: Secondary | ICD-10-CM | POA: Diagnosis present

## 2020-04-23 DIAGNOSIS — D6959 Other secondary thrombocytopenia: Secondary | ICD-10-CM | POA: Diagnosis present

## 2020-04-23 DIAGNOSIS — J440 Chronic obstructive pulmonary disease with acute lower respiratory infection: Secondary | ICD-10-CM | POA: Diagnosis present

## 2020-04-23 DIAGNOSIS — K529 Noninfective gastroenteritis and colitis, unspecified: Secondary | ICD-10-CM

## 2020-04-23 DIAGNOSIS — Z794 Long term (current) use of insulin: Secondary | ICD-10-CM

## 2020-04-23 DIAGNOSIS — I5032 Chronic diastolic (congestive) heart failure: Secondary | ICD-10-CM | POA: Diagnosis present

## 2020-04-23 DIAGNOSIS — Z87891 Personal history of nicotine dependence: Secondary | ICD-10-CM

## 2020-04-23 DIAGNOSIS — E11649 Type 2 diabetes mellitus with hypoglycemia without coma: Secondary | ICD-10-CM | POA: Diagnosis present

## 2020-04-23 DIAGNOSIS — Z79899 Other long term (current) drug therapy: Secondary | ICD-10-CM

## 2020-04-23 DIAGNOSIS — E872 Acidosis: Secondary | ICD-10-CM | POA: Diagnosis present

## 2020-04-23 DIAGNOSIS — J449 Chronic obstructive pulmonary disease, unspecified: Secondary | ICD-10-CM | POA: Diagnosis present

## 2020-04-23 DIAGNOSIS — N179 Acute kidney failure, unspecified: Secondary | ICD-10-CM | POA: Diagnosis present

## 2020-04-23 DIAGNOSIS — I8500 Esophageal varices without bleeding: Secondary | ICD-10-CM | POA: Diagnosis present

## 2020-04-23 DIAGNOSIS — I878 Other specified disorders of veins: Secondary | ICD-10-CM | POA: Diagnosis present

## 2020-04-23 LAB — PROTIME-INR
INR: 1.3 — ABNORMAL HIGH (ref 0.8–1.2)
Prothrombin Time: 15.4 seconds — ABNORMAL HIGH (ref 11.4–15.2)

## 2020-04-23 LAB — CBC WITH DIFFERENTIAL/PLATELET
Abs Immature Granulocytes: 0.04 10*3/uL (ref 0.00–0.07)
Basophils Absolute: 0 10*3/uL (ref 0.0–0.1)
Basophils Relative: 0 %
Eosinophils Absolute: 0 10*3/uL (ref 0.0–0.5)
Eosinophils Relative: 0 %
HCT: 24.5 % — ABNORMAL LOW (ref 39.0–52.0)
Hemoglobin: 7.9 g/dL — ABNORMAL LOW (ref 13.0–17.0)
Immature Granulocytes: 0 %
Lymphocytes Relative: 6 %
Lymphs Abs: 0.6 10*3/uL — ABNORMAL LOW (ref 0.7–4.0)
MCH: 30.4 pg (ref 26.0–34.0)
MCHC: 32.2 g/dL (ref 30.0–36.0)
MCV: 94.2 fL (ref 80.0–100.0)
Monocytes Absolute: 0.2 10*3/uL (ref 0.1–1.0)
Monocytes Relative: 2 %
Neutro Abs: 8.8 10*3/uL — ABNORMAL HIGH (ref 1.7–7.7)
Neutrophils Relative %: 92 %
Platelets: 123 10*3/uL — ABNORMAL LOW (ref 150–400)
RBC: 2.6 MIL/uL — ABNORMAL LOW (ref 4.22–5.81)
RDW: 17.4 % — ABNORMAL HIGH (ref 11.5–15.5)
WBC: 9.6 10*3/uL (ref 4.0–10.5)
nRBC: 0 % (ref 0.0–0.2)

## 2020-04-23 LAB — COMPREHENSIVE METABOLIC PANEL
ALT: 19 U/L (ref 0–44)
AST: 32 U/L (ref 15–41)
Albumin: 3.4 g/dL — ABNORMAL LOW (ref 3.5–5.0)
Alkaline Phosphatase: 91 U/L (ref 38–126)
Anion gap: 13 (ref 5–15)
BUN: 63 mg/dL — ABNORMAL HIGH (ref 8–23)
CO2: 24 mmol/L (ref 22–32)
Calcium: 9 mg/dL (ref 8.9–10.3)
Chloride: 104 mmol/L (ref 98–111)
Creatinine, Ser: 2.85 mg/dL — ABNORMAL HIGH (ref 0.61–1.24)
GFR calc Af Amer: 25 mL/min — ABNORMAL LOW (ref >60)
GFR calc non Af Amer: 22 mL/min — ABNORMAL LOW (ref >60)
Glucose, Bld: 121 mg/dL — ABNORMAL HIGH (ref 70–99)
Potassium: 3.5 mmol/L (ref 3.5–5.1)
Sodium: 141 mmol/L (ref 135–145)
Total Bilirubin: 1 mg/dL (ref 0.3–1.2)
Total Protein: 7.7 g/dL (ref 6.5–8.1)

## 2020-04-23 LAB — LACTIC ACID, PLASMA
Lactic Acid, Venous: 3 mmol/L (ref 0.5–1.9)
Lactic Acid, Venous: 1.2 mmol/L (ref 0.5–1.9)
Lactic Acid, Venous: 1.3 mmol/L (ref 0.5–1.9)

## 2020-04-23 LAB — SARS CORONAVIRUS 2 BY RT PCR (HOSPITAL ORDER, PERFORMED IN ~~LOC~~ HOSPITAL LAB): SARS Coronavirus 2: NEGATIVE

## 2020-04-23 LAB — GLUCOSE, CAPILLARY
Glucose-Capillary: 52 mg/dL — ABNORMAL LOW (ref 70–99)
Glucose-Capillary: 84 mg/dL (ref 70–99)

## 2020-04-23 LAB — APTT: aPTT: 30 seconds (ref 24–36)

## 2020-04-23 LAB — TROPONIN I (HIGH SENSITIVITY)
Troponin I (High Sensitivity): 18 ng/L — ABNORMAL HIGH (ref <18)
Troponin I (High Sensitivity): 578 ng/L (ref <18)

## 2020-04-23 LAB — BRAIN NATRIURETIC PEPTIDE: B Natriuretic Peptide: 376.4 pg/mL — ABNORMAL HIGH (ref 0.0–100.0)

## 2020-04-23 MED ORDER — INSULIN ASPART 100 UNIT/ML ~~LOC~~ SOLN
0.0000 [IU] | Freq: Three times a day (TID) | SUBCUTANEOUS | Status: DC
  Administered 2020-04-24: 4 [IU] via SUBCUTANEOUS
  Administered 2020-04-24: 7 [IU] via SUBCUTANEOUS
  Administered 2020-04-24 – 2020-04-26 (×4): 4 [IU] via SUBCUTANEOUS
  Administered 2020-04-26: 3 [IU] via SUBCUTANEOUS
  Administered 2020-04-26: 4 [IU] via SUBCUTANEOUS
  Administered 2020-04-27: 7 [IU] via SUBCUTANEOUS
  Administered 2020-04-27: 4 [IU] via SUBCUTANEOUS
  Administered 2020-04-27: 7 [IU] via SUBCUTANEOUS
  Administered 2020-04-28: 4 [IU] via SUBCUTANEOUS
  Administered 2020-04-28: 11 [IU] via SUBCUTANEOUS
  Administered 2020-04-28: 7 [IU] via SUBCUTANEOUS
  Administered 2020-04-29: 4 [IU] via SUBCUTANEOUS
  Administered 2020-04-29 (×2): 7 [IU] via SUBCUTANEOUS
  Administered 2020-04-30: 4 [IU] via SUBCUTANEOUS
  Administered 2020-04-30: 3 [IU] via SUBCUTANEOUS
  Administered 2020-04-30: 4 [IU] via SUBCUTANEOUS
  Administered 2020-05-01: 11 [IU] via SUBCUTANEOUS
  Administered 2020-05-01 (×2): 4 [IU] via SUBCUTANEOUS
  Administered 2020-05-02: 7 [IU] via SUBCUTANEOUS
  Administered 2020-05-02: 11 [IU] via SUBCUTANEOUS
  Administered 2020-05-02 – 2020-05-03 (×2): 4 [IU] via SUBCUTANEOUS
  Administered 2020-05-03: 15 [IU] via SUBCUTANEOUS
  Filled 2020-04-23 (×22): qty 1

## 2020-04-23 MED ORDER — METRONIDAZOLE IN NACL 5-0.79 MG/ML-% IV SOLN
500.0000 mg | Freq: Three times a day (TID) | INTRAVENOUS | Status: DC
  Administered 2020-04-23 – 2020-04-24 (×2): 500 mg via INTRAVENOUS
  Filled 2020-04-23 (×3): qty 100

## 2020-04-23 MED ORDER — FUROSEMIDE 10 MG/ML IJ SOLN
10.0000 mg/h | INTRAVENOUS | Status: DC
  Administered 2020-04-23 – 2020-04-27 (×4): 10 mg/h via INTRAVENOUS
  Filled 2020-04-23 (×6): qty 25

## 2020-04-23 MED ORDER — SODIUM CHLORIDE 0.9 % IV SOLN: INTRAVENOUS | Status: DC

## 2020-04-23 MED ORDER — ENOXAPARIN SODIUM 30 MG/0.3ML ~~LOC~~ SOLN
30.0000 mg | SUBCUTANEOUS | Status: DC
  Administered 2020-04-23: 30 mg via SUBCUTANEOUS
  Filled 2020-04-23 (×2): qty 0.3

## 2020-04-23 MED ORDER — SODIUM CHLORIDE 0.9 % IV SOLN
250.0000 mL | INTRAVENOUS | Status: DC | PRN
  Administered 2020-05-02: 250 mL via INTRAVENOUS

## 2020-04-23 MED ORDER — SODIUM CHLORIDE 0.9 % IV SOLN
2.0000 g | Freq: Two times a day (BID) | INTRAVENOUS | Status: DC
  Administered 2020-04-23: 2 g via INTRAVENOUS
  Filled 2020-04-23 (×3): qty 2

## 2020-04-23 MED ORDER — SODIUM CHLORIDE 0.9% FLUSH
3.0000 mL | Freq: Two times a day (BID) | INTRAVENOUS | Status: DC
  Administered 2020-04-23 – 2020-05-03 (×15): 3 mL via INTRAVENOUS

## 2020-04-23 MED ORDER — VANCOMYCIN HCL IN DEXTROSE 1-5 GM/200ML-% IV SOLN
1000.0000 mg | Freq: Once | INTRAVENOUS | Status: AC
  Administered 2020-04-23: 1000 mg via INTRAVENOUS
  Filled 2020-04-23: qty 200

## 2020-04-23 MED ORDER — VANCOMYCIN HCL 1500 MG/300ML IV SOLN
1500.0000 mg | Freq: Once | INTRAVENOUS | Status: AC
  Administered 2020-04-23: 1500 mg via INTRAVENOUS
  Filled 2020-04-23: qty 300

## 2020-04-23 MED ORDER — ENOXAPARIN SODIUM 40 MG/0.4ML ~~LOC~~ SOLN: 40.0000 mg | SUBCUTANEOUS | Status: DC

## 2020-04-23 MED ORDER — ACETAMINOPHEN 500 MG PO TABS
1000.0000 mg | ORAL_TABLET | Freq: Once | ORAL | Status: AC
  Administered 2020-04-23: 1000 mg via ORAL
  Filled 2020-04-23: qty 2

## 2020-04-23 MED ORDER — ONDANSETRON HCL 4 MG/2ML IJ SOLN
4.0000 mg | Freq: Four times a day (QID) | INTRAMUSCULAR | Status: DC | PRN
  Administered 2020-04-24: 4 mg via INTRAVENOUS
  Filled 2020-04-23: qty 2

## 2020-04-23 MED ORDER — SODIUM CHLORIDE 0.9% FLUSH: 3.0000 mL | INTRAVENOUS | Status: DC | PRN

## 2020-04-23 MED ORDER — SODIUM CHLORIDE 0.9 % IV SOLN
2.0000 g | Freq: Once | INTRAVENOUS | Status: AC
  Administered 2020-04-23: 2 g via INTRAVENOUS
  Filled 2020-04-23: qty 2

## 2020-04-23 MED ORDER — ACETAMINOPHEN 325 MG PO TABS
650.0000 mg | ORAL_TABLET | ORAL | Status: DC | PRN
  Administered 2020-04-23 – 2020-04-24 (×2): 650 mg via ORAL
  Filled 2020-04-23 (×2): qty 2

## 2020-04-23 MED ORDER — ONDANSETRON HCL 4 MG/2ML IJ SOLN: 4.0000 mg | Freq: Once | INTRAMUSCULAR | Status: AC

## 2020-04-23 MED ORDER — SODIUM CHLORIDE 0.9 % IV BOLUS (SEPSIS)
500.0000 mL | Freq: Once | INTRAVENOUS | Status: AC
  Administered 2020-04-23: 500 mL via INTRAVENOUS

## 2020-04-23 MED ORDER — METRONIDAZOLE IN NACL 5-0.79 MG/ML-% IV SOLN
500.0000 mg | Freq: Once | INTRAVENOUS | Status: AC
  Administered 2020-04-23: 500 mg via INTRAVENOUS
  Filled 2020-04-23: qty 100

## 2020-04-23 MED ORDER — FUROSEMIDE 10 MG/ML IJ SOLN: 20.0000 mg | Freq: Two times a day (BID) | INTRAMUSCULAR | Status: DC

## 2020-04-23 MED ORDER — ENOXAPARIN SODIUM 40 MG/0.4ML ~~LOC~~ SOLN
40.0000 mg | Freq: Two times a day (BID) | SUBCUTANEOUS | Status: DC
  Administered 2020-04-24 – 2020-04-30 (×13): 40 mg via SUBCUTANEOUS
  Filled 2020-04-23 (×13): qty 0.4

## 2020-04-23 MED ORDER — VANCOMYCIN HCL 1500 MG/300ML IV SOLN
1500.0000 mg | INTRAVENOUS | Status: DC
  Filled 2020-04-23: qty 300

## 2020-04-23 MED ORDER — INSULIN ASPART 100 UNIT/ML ~~LOC~~ SOLN
0.0000 [IU] | Freq: Every day | SUBCUTANEOUS | Status: DC
  Administered 2020-04-26 – 2020-04-29 (×2): 2 [IU] via SUBCUTANEOUS
  Filled 2020-04-23 (×4): qty 1

## 2020-04-23 MED ORDER — ONDANSETRON HCL 4 MG/2ML IJ SOLN
INTRAMUSCULAR | Status: AC
  Administered 2020-04-23: 4 mg via INTRAVENOUS
  Filled 2020-04-23: qty 2

## 2020-04-23 NOTE — ED Notes (Signed)
Pt had urinated and had diarrhea on self- this RN, Anderson Malta RN, and Hunter RN cleaned pt and replaced linens- pt repositioned for comfort- contacted CT and they stated they would come get pt shortly

## 2020-04-23 NOTE — Consult Note (Signed)
Pharmacy Antibiotic Note  Corey West is a 69 y.o. male admitted on 04/23/2020 with sepsis.  Pharmacy has been consulted for Vancomycin/Cefepime dosing.  Plan: Will dose Cefepime 2g q12h (CrCl 30-57ml/min)  Pt received a total loading dose in ED of 2500mg  Vancomycin, will follow with 1500mg  q24hr (per wt 120-140kg and CrCl 30-60 per nomogram)  Height: 5\' 9"  (175.3 cm) Weight: (!) 131.5 kg (290 lb) IBW/kg (Calculated) : 70.7  Temp (24hrs), Avg:101.8 F (38.8 C), Min:101.8 F (38.8 C), Max:101.8 F (38.8 C)  Recent Labs  Lab 04/23/20 1506  WBC 9.6  CREATININE 2.85*  LATICACIDVEN 3.0*    Estimated Creatinine Clearance: 33.3 mL/min (A) (by C-G formula based on SCr of 2.85 mg/dL (H)).    No Known Allergies  Antimicrobials this admission: Cefepime 7/24 >> Vancomycin 7/24 >>  Dose adjustments this admission: None  Microbiology results: 7/24 BCx/ UCx: pending    COVID NEG  Thank you for allowing pharmacy to be a part of this patient's care.  Lu Duffel, PharmD, BCPS Clinical Pharmacist 04/23/2020 7:46 PM

## 2020-04-23 NOTE — Progress Notes (Signed)
Notified provider of need to order repeat lactic acid. ° °

## 2020-04-23 NOTE — ED Provider Notes (Signed)
[facility] Emergency Department Provider Note ____________________________________________   First MD Initiated Contact with Patient 04/23/20 1512     (approximate)  I have reviewed the triage vital signs and the nursing notes.   HISTORY  Chief Complaint Shortness of Breath  Level 5 caveat: History present illness limited respiratory distress  HPI Corey West is a 69 y.o. male with PMH as noted below including CHF, atrial fibrillation, hypertension, hyperlipidemia, diabetes, cirrhosis, and chronic kidney disease who presents with what he reports is a primary complaint of diarrhea. The patient is unclear about when it started. He denies increased shortness of breath or cough to me although this is what was told to EMS.  Past Medical History:  Diagnosis Date  . Anemia   . Anxiety    controlled;   . Arthritis   . AVM (arteriovenous malformation) of stomach, acquired with hemorrhage   . Barrett's esophagus   . CHF (congestive heart failure) ([place])   . Chronic kidney disease    renal infufficiency  . Cirrhosis ([place])   . Depression    controlled;   Marland Kitchen Diabetes mellitus without complication ([place])    not controlled, taking insulin but sugar continues to run high;   . Edema   . Esophageal varices ([place])   . GAVE (gastric antral vascular ectasia)   . GERD (gastroesophageal reflux disease)   . History of hiatal hernia   . Hyperlipidemia   . Hypertension    controlled well;   . Liver cirrhosis ([place])   . Nephropathy, diabetic ([place])   . Obesity   . Pancytopenia ([place])   . Polyp, stomach    with chronic blood loss  . Sleep apnea    does not wear a cpap, Medicare would not pay for it   . Venous stasis dermatitis of both lower extremities   . Venous stasis of both lower extremities    with cellulitis    Patient Active Problem List   Diagnosis Date Noted  . Acute gastroenteritis 04/23/2020  . COPD with chronic bronchitis ([place]) 04/23/2020  . Paroxysmal  atrial fibrillation ([place]) 04/20/2020  . Cellulitis of left leg 02/23/2020  . Contusion of ribs, left, initial encounter 02/23/2020  . Acute hypoxemic respiratory failure ([place]) 02/04/2020  . Volume overload 01/28/2020  . Hyperlipidemia associated with type 2 diabetes mellitus ([place]) 01/28/2020  . Liver cirrhosis ([place])   . Anxiety   . SOB (shortness of breath)   . Elevated troponin   . CKD (chronic kidney disease) stage 4, GFR 15-29 ml/min (HCC)   . Unstageable pressure ulcer of sacral region ([place]) 11/28/2019  . Acute on chronic diastolic CHF (congestive heart failure) ([place]) 11/27/2019  . Type II diabetes mellitus with renal manifestations ([place]) 11/27/2019  . Cellulitis of left lower extremity 11/27/2019  . Cellulitis of lower extremity 11/27/2019  . Cyst of pancreas 05/18/2019  . HCAP (healthcare-associated pneumonia) 02/03/2019  . Aphasia 01/14/2019  . Acute on chronic renal failure ([place]) 01/14/2019  . Malnutrition of moderate degree 12/03/2018  . C. difficile diarrhea 12/02/2018  . Ankle fracture, left, closed, initial encounter 10/15/2018  . Ankle fracture, right, closed, initial encounter 10/15/2018  . Ankle fracture 10/15/2018  . Pressure injury of skin 07/19/2018  . UTI (urinary tract infection) 07/18/2018  . Acute kidney injury superimposed on CKD ([place]) 01/02/2018  . 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 Goals of care, counseling/discussion   . Palliative care encounter   . Acute hepatic encephalopathy 12/16/2017  .  Hepatic encephalopathy ([place]) 10/18/2017  . Hypokalemia 10/09/2017  . Hyperglycemia 10/09/2017  . Chronic anemia 06/26/2017  . Sepsis ([place]) 06/12/2017  . Type 2 diabetes mellitus with diabetic nephropathy, with long-term current use of insulin ([place]) 05/29/2017  . Long term (current) use of insulin ([place]) 05/29/2017  . Cellulitis in diabetic foot ([place]) 02/21/2017  . Cellulitis 02/21/2017  . GI bleed 07/20/2015  . Hematemesis 02/08/2015  .  Cirrhosis ([place]) 02/08/2015  . Esophageal varices ([place]) 02/08/2015  . Gastroesophageal reflux disease 02/08/2015  . Hyperlipidemia 02/08/2015  . Chronic venous stasis dermatitis of both lower extremities 10/06/2014  . Cutaneous abscess of limb, unspecified 09/17/2014  . Edema 05/25/2014  . Hypertension 05/25/2014  . Obesity, Class III, BMI 40-49.9 (morbid obesity) ([place]) 05/25/2014  . Sleep apnea 05/25/2014  . Barrett's esophagus 05/25/2014  . Pancytopenia ([place]) 05/25/2014  . History of esophageal varices 05/25/2014  . Iron deficiency anemia 12/04/2013  . Thrombocytopenia ([place]) 12/04/2013  . Splenomegaly 11/04/2013    Past Surgical History:  Procedure Laterality Date  . ESOPHAGOGASTRODUODENOSCOPY N/A 02/09/2015   Procedure: ESOPHAGOGASTRODUODENOSCOPY (EGD);  Surgeon: Manya Silvas, MD;  Location: [facility] ENDOSCOPY;  Service: Endoscopy;  Laterality: N/A;  . ESOPHAGOGASTRODUODENOSCOPY N/A 07/22/2015   Procedure: ESOPHAGOGASTRODUODENOSCOPY (EGD);  Surgeon: Manya Silvas, MD;  Location: [facility] ENDOSCOPY;  Service: Endoscopy;  Laterality: N/A;  . ESOPHAGOGASTRODUODENOSCOPY N/A 06/26/2017   Procedure: ESOPHAGOGASTRODUODENOSCOPY (EGD);  Surgeon: Manya Silvas, MD;  Location: [facility] ENDOSCOPY;  Service: Endoscopy;  Laterality: N/A;  . ESOPHAGOGASTRODUODENOSCOPY N/A 06/27/2017   Procedure: ESOPHAGOGASTRODUODENOSCOPY (EGD);  Surgeon: Manya Silvas, MD;  Location: [facility] ENDOSCOPY;  Service: Endoscopy;  Laterality: N/A;  . ESOPHAGOGASTRODUODENOSCOPY N/A 06/28/2017   Procedure: ESOPHAGOGASTRODUODENOSCOPY (EGD);  Surgeon: Manya Silvas, MD;  Location: [facility] ENDOSCOPY;  Service: Endoscopy;  Laterality: N/A;  . ESOPHAGOGASTRODUODENOSCOPY Bilateral 10/11/2017   Procedure: ESOPHAGOGASTRODUODENOSCOPY (EGD);  Surgeon: Manya Silvas, MD;  Location: [facility] ENDOSCOPY;  Service: Endoscopy;  Laterality: Bilateral;  . ESOPHAGOGASTRODUODENOSCOPY N/A 12/04/2017   Procedure: ESOPHAGOGASTRODUODENOSCOPY (EGD);   Surgeon: Manya Silvas, MD;  Location: [facility] ENDOSCOPY;  Service: Endoscopy;  Laterality: N/A;  . ESOPHAGOGASTRODUODENOSCOPY N/A 03/06/2019   Procedure: ESOPHAGOGASTRODUODENOSCOPY (EGD);  Surgeon: Toledo, Benay Pike, MD;  Location: [facility] ENDOSCOPY;  Service: Gastroenterology;  Laterality: N/A;  . ESOPHAGOGASTRODUODENOSCOPY (EGD) WITH PROPOFOL N/A 07/20/2015   Procedure: ESOPHAGOGASTRODUODENOSCOPY (EGD) WITH PROPOFOL;  Surgeon: Manya Silvas, MD;  Location: [facility] ENDOSCOPY;  Service: Endoscopy;  Laterality: N/A;  . ESOPHAGOGASTRODUODENOSCOPY (EGD) WITH PROPOFOL N/A 09/16/2015   Procedure: ESOPHAGOGASTRODUODENOSCOPY (EGD) WITH PROPOFOL;  Surgeon: Manya Silvas, MD;  Location: [facility] ENDOSCOPY;  Service: Endoscopy;  Laterality: N/A;  . ESOPHAGOGASTRODUODENOSCOPY (EGD) WITH PROPOFOL N/A 03/16/2016   Procedure: ESOPHAGOGASTRODUODENOSCOPY (EGD) WITH PROPOFOL;  Surgeon: Manya Silvas, MD;  Location: [facility] ENDOSCOPY;  Service: Endoscopy;  Laterality: N/A;  . ESOPHAGOGASTRODUODENOSCOPY (EGD) WITH PROPOFOL N/A 09/14/2016   Procedure: ESOPHAGOGASTRODUODENOSCOPY (EGD) WITH PROPOFOL;  Surgeon: Manya Silvas, MD;  Location: [facility] ENDOSCOPY;  Service: Endoscopy;  Laterality: N/A;  . ESOPHAGOGASTRODUODENOSCOPY (EGD) WITH PROPOFOL N/A 11/05/2016   Procedure: ESOPHAGOGASTRODUODENOSCOPY (EGD) WITH PROPOFOL;  Surgeon: Manya Silvas, MD;  Location: [facility] ENDOSCOPY;  Service: Endoscopy;  Laterality: N/A;  . ESOPHAGOGASTRODUODENOSCOPY (EGD) WITH PROPOFOL N/A 02/06/2017   Procedure: ESOPHAGOGASTRODUODENOSCOPY (EGD) WITH PROPOFOL;  Surgeon: Manya Silvas, MD;  Location: [facility] ENDOSCOPY;  Service: Endoscopy;  Laterality: N/A;  . ESOPHAGOGASTRODUODENOSCOPY (EGD) WITH PROPOFOL N/A 04/24/2017   Procedure: ESOPHAGOGASTRODUODENOSCOPY (EGD) WITH PROPOFOL;  Surgeon: Manya Silvas, MD;  Location: [facility] ENDOSCOPY;  Service: Endoscopy;  Laterality: N/A;  . ESOPHAGOGASTRODUODENOSCOPY (EGD) WITH PROPOFOL N/A 07/31/2017    Procedure: ESOPHAGOGASTRODUODENOSCOPY (EGD) WITH PROPOFOL;  Surgeon: Manya Silvas, MD;  Location: [facility] ENDOSCOPY;  Service: Endoscopy;  Laterality: N/A;  . ESOPHAGOGASTRODUODENOSCOPY (EGD) WITH PROPOFOL N/A 08/08/2018   Procedure: ESOPHAGOGASTRODUODENOSCOPY (EGD) WITH PROPOFOL;  Surgeon: Manya Silvas, MD;  Location: [facility] ENDOSCOPY;  Service: Endoscopy;  Laterality: N/A;  . GIVENS CAPSULE STUDY N/A 12/05/2017   Procedure: GIVENS CAPSULE STUDY;  Surgeon: Manya Silvas, MD;  Location: [facility] ENDOSCOPY;  Service: Endoscopy;  Laterality: N/A;  . TONSILLECTOMY    . TONSILLECTOMY AND ADENOIDECTOMY    . ULNAR NERVE TRANSPOSITION    . UVULOPALATOPHARYNGOPLASTY      Prior to Admission medications   Medication Sig Start Date End Date Taking? Authorizing Provider  amLODipine (NORVASC) 5 MG tablet Take 1 tablet (5 mg total) by mouth daily. 02/13/20  Yes Wieting, Richard, MD  atorvastatin (LIPITOR) 20 MG tablet Take 20 mg by mouth daily. 03/23/20  Yes [provider]  Ferrous Sulfate (IRON) 325 (65 Fe) MG TABS Take 1 tablet by mouth in the morning and at bedtime.    Yes [provider]  furosemide (LASIX) 40 MG tablet Take 40-160 mg by mouth once daily   Yes [provider]  hydrALAZINE (APRESOLINE) 25 MG tablet Take 1 tablet (25 mg total) by mouth in the morning and at bedtime. 02/27/20  Yes Jennye Boroughs, MD  Ipratropium-Albuterol (COMBIVENT RESPIMAT) 20-100 MCG/ACT AERS respimat Inhale 1 puff into the lungs every 6 (six) hours. 02/12/20  Yes Wieting, Richard, MD  lactulose (CHRONULAC) 10 GM/15ML solution Take 45 mLs (30 g total) by mouth 2 (two) times daily. Patient taking differently: 90 mL Oral Daily 02/12/20  Yes Wieting, Richard, MD  LORazepam (ATIVAN) 0.5 MG tablet Take 1 tablet (0.5 mg total) by mouth 3 (three) times daily as needed for Anxiety   Yes [provider]  losartan (COZAAR) 25 MG tablet Take 25 mg by mouth daily.  03/17/20  Yes [provider]  metolazone (ZAROXOLYN) 5 MG tablet Take 5 mg by mouth every Monday, Wednesday, and Friday. Take 30 minutes before Lasix   Yes [provider]  Multiple Vitamin (MULTIVITAMIN) tablet Take 1 tablet by mouth daily.   Yes [provider]  NOVOLOG MIX 70/30 FLEXPEN (70-30) 100 UNIT/ML FlexPen as directed. 0-84 Units Subcutaneous 2 times Daily before meals 03/17/20  Yes [provider]  pantoprazole (PROTONIX) 40 MG tablet Take 1 tablet (40 mg total) by mouth 2 (two) times daily. 03/06/19 04/23/20 Yes Dustin Flock, MD  potassium chloride (KLOR-CON) 10 MEQ tablet Take 10 mEq by mouth daily. 04/21/20  Yes [provider]  rifaximin (XIFAXAN) 550 MG TABS tablet Take 1 tablet (550 mg total) by mouth 2 (two) times daily. 02/12/20  Yes Wieting, Richard, MD  sodium chloride (OCEAN) 0.65 % SOLN nasal spray Place 1 spray into both nostrils as needed for congestion (nose irritation). 02/12/20  Yes Wieting, Richard, MD  spironolactone (ALDACTONE) 25 MG tablet Take 1 tablet (25 mg total) by mouth daily. 02/13/20  Yes Wieting, Richard, MD  traMADol (ULTRAM) 50 MG tablet Take 1 tablet (50 mg total) by mouth every 8 (eight) hours as needed for moderate pain. 02/27/20  Yes Jennye Boroughs, MD    Allergies Patient has no known allergies.  Family History  Problem Relation Age of Onset  . Diabetes Other   . Transient ischemic attack Father   . CAD Father  Social History Social History   Tobacco Use  . Smoking status: Former Smoker    Years: 20.00    Types: Cigars, Cigarettes  . Smokeless tobacco: Never Used  Vaping Use  . Vaping Use: Never used  Substance Use Topics  . Alcohol use: No    Comment: stopped 15 years ago  . Drug use: No    Review of Systems Level 5 caveat: Review of systems limited due to respiratory distress Constitutional: Positive for fever. Respiratory: Denies shortness of breath. Gastrointestinal: Positive for diarrhea. Skin: Negative for  rash. Neurological: Negative for headache.   ____________________________________________   PHYSICAL EXAM:  VITAL SIGNS: ED Triage Vitals  Enc Vitals Group     BP 04/23/20 1439 (!) 163/67     Pulse Rate 04/23/20 1439 94     Resp 04/23/20 1439 20     Temp 04/23/20 1439 (!) 101.8 F (38.8 C)     Temp Source 04/23/20 1439 Oral     SpO2 04/23/20 1439 95 %     Weight 04/23/20 1440 (!) 290 lb (131.5 kg)     Height 04/23/20 1440 5\' 9"  (1.753 m)     Head Circumference --      Peak Flow --      Pain Score 04/23/20 1440 0     Pain Loc --      Pain Edu? --      Excl. in [place]? --     Constitutional: Alert and oriented. Ill-appearing with increased work of breathing and somewhat delayed responses to questions. Eyes: Conjunctivae are normal.  Head: Atraumatic. Nose: No congestion/rhinnorhea. Mouth/Throat: Mucous membranes are moist.   Neck: Normal range of motion.  Cardiovascular: Normal rate, regular rhythm. Grossly normal heart sounds.  Good peripheral circulation. Respiratory: Increased respiratory effort with accessory muscle use. Faint rales bilaterally. Gastrointestinal: Soft and nontender. No distention.  Genitourinary: No flank tenderness. Musculoskeletal: 2++ bilateral lower extremity edema.  Extremities warm and well perfused.  Neurologic:  Normal speech and language. Motor intact in all extremities.  Skin:  Skin is warm and dry. No rash noted. Psychiatric: Calm and cooperative.  ____________________________________________   LABS (all labs ordered are listed, but only abnormal results are displayed)  Labs Reviewed  LACTIC ACID, PLASMA - Abnormal; Notable for the following components:      Result Value   Lactic Acid, Venous 3.0 (*)    All other components within normal limits  COMPREHENSIVE METABOLIC PANEL - Abnormal; Notable for the following components:   Glucose, Bld 121 (*)    BUN 63 (*)    Creatinine, Ser 2.85 (*)    Albumin 3.4 (*)    GFR calc non Af Amer 22  (*)    GFR calc Af Amer 25 (*)    All other components within normal limits  CBC WITH DIFFERENTIAL/PLATELET - Abnormal; Notable for the following components:   RBC 2.60 (*)    Hemoglobin 7.9 (*)    HCT 24.5 (*)    RDW 17.4 (*)    Platelets 123 (*)    Neutro Abs 8.8 (*)    Lymphs Abs 0.6 (*)    All other components within normal limits  PROTIME-INR - Abnormal; Notable for the following components:   Prothrombin Time 15.4 (*)    INR 1.3 (*)    All other components within normal limits  BRAIN NATRIURETIC PEPTIDE - Abnormal; Notable for the following components:   B Natriuretic Peptide 376.4 (*)    All other components within normal  limits  GLUCOSE, CAPILLARY - Abnormal; Notable for the following components:   Glucose-Capillary 52 (*)    All other components within normal limits  TROPONIN I (HIGH SENSITIVITY) - Abnormal; Notable for the following components:   Troponin I (High Sensitivity) 18 (*)    All other components within normal limits  TROPONIN I (HIGH SENSITIVITY) - Abnormal; Notable for the following components:   Troponin I (High Sensitivity) 578 (*)    All other components within normal limits  SARS CORONAVIRUS 2 (HOSPITAL ORDER, [place] LAB)  CULTURE, BLOOD (ROUTINE X 2)  CULTURE, BLOOD (ROUTINE X 2)  URINE CULTURE  LACTIC ACID, PLASMA  LACTIC ACID, PLASMA  APTT  GLUCOSE, CAPILLARY  URINALYSIS, ROUTINE W REFLEX MICROSCOPIC  BLOOD GAS, VENOUS  BASIC METABOLIC PANEL  OCCULT BLOOD X 1 CARD TO LAB, STOOL   ____________________________________________  EKG  ED ECG REPORT I, Arta Silence, the attending physician, personally viewed and interpreted this ECG.  Date: 04/23/2020 EKG Time: 1444 Rate: 101 Rhythm: Atrial fibrillation QRS Axis: normal Intervals: Borderline IVCD ST/T Wave abnormalities: Borderline repolarization abnormality Narrative Interpretation: Nonspecific abnormalities with no evidence of acute  ischemia  ____________________________________________  RADIOLOGY  CXR: Chronic appearing vascular congestion with no focal infiltrate  ____________________________________________   PROCEDURES  Procedure(s) performed: No  Procedures  Critical Care performed: Yes  CRITICAL CARE Performed by: Arta Silence   Total critical care time: 35 minutes  Critical care time was exclusive of separately billable procedures and treating other patients.  Critical care was necessary to treat or prevent imminent or life-threatening deterioration.  Critical care was time spent personally by me on the following activities: development of treatment plan with patient and/or surrogate as well as nursing, discussions with consultants, evaluation of patient's response to treatment, examination of patient, obtaining history from patient or surrogate, ordering and performing treatments and interventions, ordering and review of laboratory studies, ordering and review of radiographic studies, pulse oximetry and re-evaluation of patient's condition. ____________________________________________   INITIAL IMPRESSION / ASSESSMENT AND PLAN / ED COURSE  Pertinent labs & imaging results that were available during my care of the patient were reviewed by me and considered in my medical decision making (see chart for details).  69 year old male with PMH as noted above including CHF, chronic kidney disease, hypertension presents with what EMS reported as shortness of breath and cough, but upon my history the patient's primary complaint was diarrhea. He has increased work of breathing and is alert and oriented but somewhat delayed in responses to questions and not able to give much history.  I reviewed the past medical records in [place]. The patient was just seen by his cardiologist Dr. Saunders Revel 3 days ago and was reporting worsening edema. He was offered admission to the hospital for IV diuresis, but decided to  continue with outpatient management at that time. He was most recently seen in the ED last month for worsening anemia requiring blood transfusion. He was last admitted in May for left lower extremity cellulitis.  On exam, the patient is ill-appearing. He is febrile and slightly hypertensive with otherwise normal vital signs, although he is requiring 4 L O2 by nasal cannula (he is not normally on home O2). He has increased work of breathing and accessory muscle use. He is oriented, but somewhat delayed in answering questions, difficult to obtain coherent history from, and appears mildly confused. He has faint rales to bilateral bases. Neurologic exam is nonfocal. The abdomen is soft and nontender. Exam  is otherwise as described above.  Overall presentation is concerning for infection/sepsis. Although the patient denies increased shortness of breath to me, it certainly appears that he is more short of breath than normal and requiring oxygen. Given the O2 requirement and increased work of breathing I will put him on BiPAP. I suspect that the shortness of breath is related to his CHF. We will obtain labs for sepsis work-up. Prior to me taking over on the patient, Dr. Jimmye Norman had provisionally put in broad-spectrum antibiotics per the sepsis protocol which I will continue. I anticipate inpatient admission.  ----------------------------------------- 8:00 PM on 04/23/2020 -----------------------------------------  The patient's work of breathing improved, so we held off on BiPAP.  CT showed no acute intra-abdominal findings.  There was no evidence of pneumonia on the chest x-ray, and urinalysis was not immediately able to be obtained.  The patient was negative for COVID-19.  Troponin was elevated to 578, consistent with likely demand ischemia.  I discussed the case with Dr. Damita Dunnings for admission to the hospitalist service.  ____________________________________________   FINAL CLINICAL IMPRESSION(S) / ED  DIAGNOSES  Final diagnoses:  Sepsis, due to unspecified organism, unspecified whether acute organ dysfunction present [facility])      NEW MEDICATIONS STARTED DURING THIS VISIT:  Current Discharge Medication List       Note:  This document was prepared using Dragon voice recognition software and may include unintentional dictation errors.   Arta Silence, MD 04/24/20 0005

## 2020-04-23 NOTE — Sepsis Progress Note (Signed)
Volume overloaded and on lasix gtt. Lactic acid cleared without volume. Vital signs stable. Will continue to monitor for the entire 6 hour period.

## 2020-04-23 NOTE — Consult Note (Signed)
PHARMACY -  BRIEF ANTIBIOTIC NOTE   Pharmacy has received consult(s) for Cefepime/Vancomycin from an ED provider.  The patient's profile has been reviewed for ht/wt/allergies/indication/available labs.    One time order(s) placed for Cefepime 2g IV x 1 and Vancomycin 1000mg  IV x 1  Will follow Vancomycin 1000mg  with 1500mg  for a total loading dose of 2500mg   Further antibiotics/pharmacy consults should be ordered by admitting physician if indicated.                       Thank you,  Lu Duffel, PharmD, BCPS Clinical Pharmacist 04/23/2020 3:01 PM

## 2020-04-23 NOTE — Progress Notes (Signed)
PHARMACIST - PHYSICIAN COMMUNICATION  CONCERNING:  Enoxaparin (Lovenox) for DVT Prophylaxis    RECOMMENDATION: Patient was prescribed enoxaprin 30mg  q24 hours for VTE prophylaxis.   Filed Weights   04/23/20 1440  Weight: (!) 131.5 kg (290 lb)    Body mass index is 42.83 kg/m.  Estimated Creatinine Clearance: 33.3 mL/min (A) (by C-G formula based on SCr of 2.85 mg/dL (H)).  BMI > 40 and CrCL > 71ml/min  DESCRIPTION: Pharmacy has adjusted enoxaparin dose per [facility] policy.  Patient is now receiving enoxaparin 40mg  every 12 hours.   Lu Duffel, PharmD Clinical Pharmacist  04/23/2020 10:05 PM

## 2020-04-23 NOTE — ED Notes (Signed)
Respiratory at bedside and states pt vomited on self- Dr Cherylann Banas aware- hold bipap until pt no longer vomiting

## 2020-04-23 NOTE — Consult Note (Signed)
CODE SEPSIS - PHARMACY COMMUNICATION  **Broad Spectrum Antibiotics should be administered within 1 hour of Sepsis diagnosis**  Time Code Sepsis Called/Page Received: 1452  Antibiotics Ordered: Cefepime/Vancomycin/Metronidazole  Time of 1st antibiotic administration: 1518  Additional action taken by pharmacy: none  If necessary, Name of Provider/Nurse Contacted: [place] ,PharmD Clinical Pharmacist  04/23/2020  2:57 PM

## 2020-04-23 NOTE — ED Triage Notes (Signed)
Pt via EMS from home. Pt c/o worsening SOB today and productive cough today, pt states he has been SOB for the past several weeks. Pt has a hx of liver failure and kidney failure. Pt A&Ox4 on arrival.

## 2020-04-23 NOTE — ED Notes (Signed)
Pt transported for CT 

## 2020-04-23 NOTE — H&P (Signed)
History and Physical    Corey West ZOX:[id number] DOB: [birth year] DOA: 04/23/2020  PCP: Leonel Ramsay, MD   Patient coming from: home  I have personally briefly reviewed patient's old medical records in [place]  Chief Complaint: Shortness of breath, diarrhea  HPI: Corey West is a 69 y.o. 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 fib not on anticoagulation, insulin-dependent type 2 diabetes, CKD stage IV, hypertension, hyperlipidemia, chronic venous stasis dermatitis and edema bilateral lower extremities, anxiety, and obesity, OSA noncompliant with CPAP who presents to the ED for evaluation of progressive shortness of breath, with recent diarrheal illness.  Patient was seen by his cardiologist, Dr. Saunders Revel on 04/20/2020 with complaints of worsening pedal edema.  He was noted to have first-degree AV block and beta-blocker was discontinued.  Hospitalization was recommended for IV diuresis but patient declined.  Right heart catheterization was discussed.  On this visit to the emergency room, patient denies increasing shortness of breath but does seem to have increased work of breathing, tachypneic with conversation.  He was placed on O2 by EMS and is satting in the mid 90s on O2 at 2 L.  He said he weighs daily and he continues to gain weight on his scale at home.  Patient denies chest pain, fever or chills.  Denies abdominal pain or vomiting but went on to vomit once while in the emergency room awaiting assessment  ED Course: On arrival, febrile at 101.8, tacky at 94-100, RR 20-32 with O2 sat 95% on 2 L.  BP 163/67.  Blood work with WBC 9600 and lactic acid 3.0.  Hemoglobin 7.9 which is about his baseline for his chronic anemia.  Creatinine 2.85, baseline around 3.  BNP 376 above baseline on troponin18.  Chest x-ray showing some central vascular congestion but no frank  edema or other acute abnormality.  EKG with atrial tachycardia.  He had a CT abdomen and pelvis that showed cirrhosis, cholelithiasis without cholecystitis, trace left pleural effusion and other incidental findings.  Patient was started on sepsis protocol, but with limited fluid administration due to greater suspicion for heart failure exacerbation.  Hospitalist consulted for admission.  Urinalysis pending at time of admission.  Review of Systems: As per HPI otherwise all other systems on review of systems negative.    Past Medical History:  Diagnosis Date  . Anemia   . Anxiety    controlled;   . Arthritis   . AVM (arteriovenous malformation) of stomach, acquired with hemorrhage   . Barrett's esophagus   . CHF (congestive heart failure) ([place])   . Chronic kidney disease    renal infufficiency  . Cirrhosis ([place])   . Depression    controlled;   Marland Kitchen Diabetes mellitus without complication ([place])    not controlled, taking insulin but sugar continues to run high;   . Edema   . Esophageal varices ([place])   . GAVE (gastric antral vascular ectasia)   . GERD (gastroesophageal reflux disease)   . History of hiatal hernia   . Hyperlipidemia   . Hypertension    controlled well;   . Liver cirrhosis ([place])   . Nephropathy, diabetic ([place])   . Obesity   . Pancytopenia ([place])   . Polyp, stomach    with chronic blood loss  . Sleep apnea    does not wear a cpap, Medicare would not pay for it   .  Venous stasis dermatitis of both lower extremities   . Venous stasis of both lower extremities    with cellulitis    Past Surgical History:  Procedure Laterality Date  . ESOPHAGOGASTRODUODENOSCOPY N/A 02/09/2015   Procedure: ESOPHAGOGASTRODUODENOSCOPY (EGD);  Surgeon: Manya Silvas, MD;  Location: [facility] ENDOSCOPY;  Service: Endoscopy;  Laterality: N/A;  . ESOPHAGOGASTRODUODENOSCOPY N/A 07/22/2015   Procedure: ESOPHAGOGASTRODUODENOSCOPY (EGD);  Surgeon: Manya Silvas, MD;  Location: [facility] ENDOSCOPY;   Service: Endoscopy;  Laterality: N/A;  . ESOPHAGOGASTRODUODENOSCOPY N/A 06/26/2017   Procedure: ESOPHAGOGASTRODUODENOSCOPY (EGD);  Surgeon: Manya Silvas, MD;  Location: [facility] ENDOSCOPY;  Service: Endoscopy;  Laterality: N/A;  . ESOPHAGOGASTRODUODENOSCOPY N/A 06/27/2017   Procedure: ESOPHAGOGASTRODUODENOSCOPY (EGD);  Surgeon: Manya Silvas, MD;  Location: [facility] ENDOSCOPY;  Service: Endoscopy;  Laterality: N/A;  . ESOPHAGOGASTRODUODENOSCOPY N/A 06/28/2017   Procedure: ESOPHAGOGASTRODUODENOSCOPY (EGD);  Surgeon: Manya Silvas, MD;  Location: [facility] ENDOSCOPY;  Service: Endoscopy;  Laterality: N/A;  . ESOPHAGOGASTRODUODENOSCOPY Bilateral 10/11/2017   Procedure: ESOPHAGOGASTRODUODENOSCOPY (EGD);  Surgeon: Manya Silvas, MD;  Location: [facility] ENDOSCOPY;  Service: Endoscopy;  Laterality: Bilateral;  . ESOPHAGOGASTRODUODENOSCOPY N/A 12/04/2017   Procedure: ESOPHAGOGASTRODUODENOSCOPY (EGD);  Surgeon: Manya Silvas, MD;  Location: [facility] ENDOSCOPY;  Service: Endoscopy;  Laterality: N/A;  . ESOPHAGOGASTRODUODENOSCOPY N/A 03/06/2019   Procedure: ESOPHAGOGASTRODUODENOSCOPY (EGD);  Surgeon: Toledo, Benay Pike, MD;  Location: [facility] ENDOSCOPY;  Service: Gastroenterology;  Laterality: N/A;  . ESOPHAGOGASTRODUODENOSCOPY (EGD) WITH PROPOFOL N/A 07/20/2015   Procedure: ESOPHAGOGASTRODUODENOSCOPY (EGD) WITH PROPOFOL;  Surgeon: Manya Silvas, MD;  Location: [facility] ENDOSCOPY;  Service: Endoscopy;  Laterality: N/A;  . ESOPHAGOGASTRODUODENOSCOPY (EGD) WITH PROPOFOL N/A 09/16/2015   Procedure: ESOPHAGOGASTRODUODENOSCOPY (EGD) WITH PROPOFOL;  Surgeon: Manya Silvas, MD;  Location: [facility] ENDOSCOPY;  Service: Endoscopy;  Laterality: N/A;  . ESOPHAGOGASTRODUODENOSCOPY (EGD) WITH PROPOFOL N/A 03/16/2016   Procedure: ESOPHAGOGASTRODUODENOSCOPY (EGD) WITH PROPOFOL;  Surgeon: Manya Silvas, MD;  Location: [facility] ENDOSCOPY;  Service: Endoscopy;  Laterality: N/A;  . ESOPHAGOGASTRODUODENOSCOPY (EGD) WITH PROPOFOL N/A 09/14/2016    Procedure: ESOPHAGOGASTRODUODENOSCOPY (EGD) WITH PROPOFOL;  Surgeon: Manya Silvas, MD;  Location: [facility] ENDOSCOPY;  Service: Endoscopy;  Laterality: N/A;  . ESOPHAGOGASTRODUODENOSCOPY (EGD) WITH PROPOFOL N/A 11/05/2016   Procedure: ESOPHAGOGASTRODUODENOSCOPY (EGD) WITH PROPOFOL;  Surgeon: Manya Silvas, MD;  Location: [facility] ENDOSCOPY;  Service: Endoscopy;  Laterality: N/A;  . ESOPHAGOGASTRODUODENOSCOPY (EGD) WITH PROPOFOL N/A 02/06/2017   Procedure: ESOPHAGOGASTRODUODENOSCOPY (EGD) WITH PROPOFOL;  Surgeon: Manya Silvas, MD;  Location: [facility] ENDOSCOPY;  Service: Endoscopy;  Laterality: N/A;  . ESOPHAGOGASTRODUODENOSCOPY (EGD) WITH PROPOFOL N/A 04/24/2017   Procedure: ESOPHAGOGASTRODUODENOSCOPY (EGD) WITH PROPOFOL;  Surgeon: Manya Silvas, MD;  Location: [facility] ENDOSCOPY;  Service: Endoscopy;  Laterality: N/A;  . ESOPHAGOGASTRODUODENOSCOPY (EGD) WITH PROPOFOL N/A 07/31/2017   Procedure: ESOPHAGOGASTRODUODENOSCOPY (EGD) WITH PROPOFOL;  Surgeon: Manya Silvas, MD;  Location: [facility] ENDOSCOPY;  Service: Endoscopy;  Laterality: N/A;  . ESOPHAGOGASTRODUODENOSCOPY (EGD) WITH PROPOFOL N/A 08/08/2018   Procedure: ESOPHAGOGASTRODUODENOSCOPY (EGD) WITH PROPOFOL;  Surgeon: Manya Silvas, MD;  Location: [facility] ENDOSCOPY;  Service: Endoscopy;  Laterality: N/A;  . GIVENS CAPSULE STUDY N/A 12/05/2017   Procedure: GIVENS CAPSULE STUDY;  Surgeon: Manya Silvas, MD;  Location: [facility] ENDOSCOPY;  Service: Endoscopy;  Laterality: N/A;  . TONSILLECTOMY    . TONSILLECTOMY AND ADENOIDECTOMY    . ULNAR NERVE TRANSPOSITION    . UVULOPALATOPHARYNGOPLASTY       reports that he has quit smoking. His smoking use included cigars and cigarettes. He quit after 20.00 years of use. He has never  used smokeless tobacco. He reports that he does not drink alcohol and does not use drugs.  No Known Allergies  Family History  Problem Relation Age of Onset  . Diabetes Other   . Transient ischemic attack Father   . CAD  Father       Prior to Admission medications   Medication Sig Start Date End Date Taking? Authorizing Provider  amLODipine (NORVASC) 5 MG tablet Take 1 tablet (5 mg total) by mouth daily. 02/13/20  Yes Wieting, Richard, MD  atorvastatin (LIPITOR) 20 MG tablet Take 20 mg by mouth daily. 03/23/20  Yes [provider]  Ferrous Sulfate (IRON) 325 (65 Fe) MG TABS Take 1 tablet by mouth in the morning and at bedtime.    Yes [provider]  furosemide (LASIX) 40 MG tablet Take 40-160 mg by mouth once daily   Yes [provider]  hydrALAZINE (APRESOLINE) 25 MG tablet Take 1 tablet (25 mg total) by mouth in the morning and at bedtime. 02/27/20  Yes Jennye Boroughs, MD  Ipratropium-Albuterol (COMBIVENT RESPIMAT) 20-100 MCG/ACT AERS respimat Inhale 1 puff into the lungs every 6 (six) hours. 02/12/20  Yes Wieting, Richard, MD  lactulose (CHRONULAC) 10 GM/15ML solution Take 45 mLs (30 g total) by mouth 2 (two) times daily. Patient taking differently: 90 mL Oral Daily 02/12/20  Yes Wieting, Richard, MD  LORazepam (ATIVAN) 0.5 MG tablet Take 1 tablet (0.5 mg total) by mouth 3 (three) times daily as needed for Anxiety   Yes [provider]  metolazone (ZAROXOLYN) 5 MG tablet Take 5 mg by mouth every Monday, Wednesday, and Friday. Take 30 minutes before Lasix   Yes [provider]  NOVOLOG MIX 70/30 FLEXPEN (70-30) 100 UNIT/ML FlexPen as directed. 0-84 Units Subcutaneous 2 times Daily before meals 03/17/20  Yes [provider]  pantoprazole (PROTONIX) 40 MG tablet Take 1 tablet (40 mg total) by mouth 2 (two) times daily. 03/06/19 04/23/20 Yes Dustin Flock, MD  potassium chloride (KLOR-CON) 10 MEQ tablet Take 10 mEq by mouth daily. 04/21/20  Yes [provider]  rifaximin (XIFAXAN) 550 MG TABS tablet Take 1 tablet (550 mg total) by mouth 2 (two) times daily. 02/12/20  Yes Wieting, Richard, MD  spironolactone (ALDACTONE) 25 MG tablet Take 1 tablet (25 mg total)  by mouth daily. 02/13/20  Yes Wieting, Richard, MD  feeding supplement, ENSURE ENLIVE, (ENSURE ENLIVE) LIQD Take 237 mLs by mouth 2 (two) times daily between meals. Patient not taking: Reported on 04/23/2020 02/12/20   Loletha Grayer, MD  iron polysaccharides (NIFEREX) 150 MG capsule Take 1 capsule (150 mg total) by mouth daily. Patient not taking: Reported on 04/23/2020 02/13/20   Loletha Grayer, MD  losartan (COZAAR) 25 MG tablet Take 25 mg by mouth daily. Patient not taking: Reported on 04/23/2020 03/17/20   [provider]  Multiple Vitamin (MULTIVITAMIN) tablet Take 1 tablet by mouth daily.    [provider]  sodium chloride (OCEAN) 0.65 % SOLN nasal spray Place 1 spray into both nostrils as needed for congestion (nose irritation). 02/12/20   Loletha Grayer, MD  traMADol (ULTRAM) 50 MG tablet Take 1 tablet (50 mg total) by mouth every 8 (eight) hours as needed for moderate pain. 02/27/20   Jennye Boroughs, MD    Physical Exam: Vitals:   04/23/20 1440 04/23/20 1832 04/23/20 1833 04/23/20 1900  BP:  (!) 117/49  (!) 113/43  Pulse:   85 85  Resp:  (!) 27 (!) 25 (!) 27  Temp:  TempSrc:      SpO2:   97% 96%  Weight: (!) 131.5 kg     Height: 5\' 9"  (1.753 m)        Vitals:   04/23/20 1440 04/23/20 1832 04/23/20 1833 04/23/20 1900  BP:  (!) 117/49  (!) 113/43  Pulse:   85 85  Resp:  (!) 27 (!) 25 (!) 27  Temp:      TempSrc:      SpO2:   97% 96%  Weight: (!) 131.5 kg     Height: 5\' 9"  (1.753 m)         Constitutional:  Patient with conversational dyspnea.  Alert, oriented x3 HEENT:      Head: Normocephalic and atraumatic.         Eyes: PERLA, EOMI, Conjunctivae are normal. Sclera is non-icteric.       Mouth/Throat: Mucous membranes are moist.       Neck: Supple with no signs of meningismus. Cardiovascular: Regular rate and rhythm. No murmurs, gallops, or rubs. 2+ symmetrical distal pulses are present . No JVD. No 3+ LE edema Respiratory: Respiratory effort  increased.Lungs sounds diminished bilaterally with bibasilar rales Gastrointestinal: Soft, non tender, and non distended with positive bowel sounds. No rebound or guarding. Genitourinary: No CVA tenderness. Musculoskeletal: Nontender with normal range of motion in all extremities.  Bilateral lower extremity wrapped in Unna boots up to knees Neurologic: Normal speech and language. Face is symmetric. Moving all extremities. No gross focal neurologic deficits . Skin: Skin is warm, dry.  Chronic appearing redness lower legs.  Unna boots in place Psychiatric: Mood and affect are normal Speech and behavior are normal   Labs on Admission: I have personally reviewed following labs and imaging studies  CBC: Recent Labs  Lab 04/23/20 1506  WBC 9.6  NEUTROABS 8.8*  HGB 7.9*  HCT 24.5*  MCV 94.2  PLT 497*   Basic Metabolic Panel: Recent Labs  Lab 04/23/20 1506  NA 141  K 3.5  CL 104  CO2 24  GLUCOSE 121*  BUN 63*  CREATININE 2.85*  CALCIUM 9.0   GFR: Estimated Creatinine Clearance: 33.3 mL/min (A) (by C-G formula based on SCr of 2.85 mg/dL (H)). Liver Function Tests: Recent Labs  Lab 04/23/20 1506  AST 32  ALT 19  ALKPHOS 91  BILITOT 1.0  PROT 7.7  ALBUMIN 3.4*   No results for input(s): LIPASE, AMYLASE in the last 168 hours. No results for input(s): AMMONIA in the last 168 hours. Coagulation Profile: Recent Labs  Lab 04/23/20 1506  INR 1.3*   Cardiac Enzymes: No results for input(s): CKTOTAL, CKMB, CKMBINDEX, TROPONINI in the last 168 hours. BNP (last 3 results) No results for input(s): PROBNP in the last 8760 hours. HbA1C: No results for input(s): HGBA1C in the last 72 hours. CBG: No results for input(s): GLUCAP in the last 168 hours. Lipid Profile: No results for input(s): CHOL, HDL, LDLCALC, TRIG, CHOLHDL, LDLDIRECT in the last 72 hours. Thyroid Function Tests: No results for input(s): TSH, T4TOTAL, FREET4, T3FREE, THYROIDAB in the last 72 hours. Anemia  Panel: No results for input(s): VITAMINB12, FOLATE, FERRITIN, TIBC, IRON, RETICCTPCT in the last 72 hours. Urine analysis:    Component Value Date/Time   COLORURINE STRAW (A) 01/28/2020 1607   APPEARANCEUR CLEAR (A) 01/28/2020 1607   APPEARANCEUR Clear 09/10/2014 0456   LABSPEC 1.006 01/28/2020 1607   LABSPEC 1.016 09/10/2014 0456   PHURINE 5.0 01/28/2020 1607   GLUCOSEU NEGATIVE 01/28/2020 1607   GLUCOSEU  Negative 09/10/2014 0456   HGBUR NEGATIVE 01/28/2020 1607   BILIRUBINUR NEGATIVE 01/28/2020 1607   BILIRUBINUR Negative 09/10/2014 [place] 01/28/2020 1607   PROTEINUR 30 (A) 01/28/2020 1607   NITRITE NEGATIVE 01/28/2020 1607   LEUKOCYTESUR NEGATIVE 01/28/2020 1607   LEUKOCYTESUR Negative 09/10/2014 0456    Radiological Exams on Admission: CT ABDOMEN PELVIS WO CONTRAST  Result Date: 04/23/2020 CLINICAL DATA:  69 year old male with history of abdominal pain and fever. EXAM: CT ABDOMEN AND PELVIS WITHOUT CONTRAST TECHNIQUE: Multidetector CT imaging of the abdomen and pelvis was performed following the standard protocol without IV contrast. COMPARISON:  No priors. FINDINGS: Lower chest: Trace left pleural effusion lying dependently. Diffuse low attenuation throughout the intravascular compartment, suggestive of anemia. Atherosclerotic calcifications in the left anterior descending, left circumflex and right coronary arteries. Calcifications of the aortic valve. Hepatobiliary: Liver has a shrunken appearance and nodular contour, indicative of underlying cirrhosis. Calcified granulomas are noted in the liver. No discrete cystic or solid hepatic lesions are other identified on today's noncontrast CT examination. Calcified gallstones are noted lying dependently in the gallbladder measuring up to 9 mm. Gallbladder is not distended. Pancreas: No definite pancreatic mass or peripancreatic fluid collections or inflammatory changes are noted on today's noncontrast CT examination.  Spleen: Spleen is mildly enlarged measuring 16.7 x 7.1 x 16.9 cm (estimated splenic volume of 1,002 mL) . Adrenals/Urinary Tract: There are no abnormal calcifications within the collecting system of either kidney, along the course of either ureter, or within the lumen of the urinary bladder. No hydroureteronephrosis or perinephric stranding to suggest urinary tract obstruction at this time. The unenhanced appearance of the kidneys is unremarkable bilaterally. Unenhanced appearance of the urinary bladder is normal. Bilateral adrenal glands are normal in appearance. Stomach/Bowel: Unenhanced appearance of the stomach is normal. No pathologic dilatation of small bowel or colon. Appendix is not confidently identified may be surgically absent. Vascular/Lymphatic: Aortic atherosclerosis. Numerous portosystemic collateral pathways, most notable for large splenorenal collateral vessels. No lymphadenopathy noted in the abdomen or pelvis. Reproductive: Prostate gland and seminal vesicles are unremarkable in appearance. Large right-sided hydrocele incompletely imaged. Other: Small umbilical hernia containing only omental fat. Small volume of ascites. No pneumoperitoneum. Musculoskeletal: There are no aggressive appearing lytic or blastic lesions noted in the visualized portions of the skeleton. IMPRESSION: 1. Morphologic changes in the liver compatible with cirrhosis. This is associated with evidence of portal venous hypertension, including splenomegaly and numerous portosystemic collateral vessels. 2. Cholelithiasis without evidence of acute cholecystitis at this time. Small umbilical hernia containing only omental fat large right-sided hydrocele incompletely imaged. 3. Trace left pleural effusion lying dependent 4. Diffuse low attenuation throughout the intravascular compartment, suggesting anemia. 5. Aortic atherosclerosis, in addition to least 3 vessel coronary artery disease. Please note that although the presence of  coronary artery calcium documents the presence of coronary artery disease, the severity of this disease and any potential stenosis cannot be assessed on this non-gated CT examination. Assessment for potential risk factor modification, dietary therapy or pharmacologic therapy may be warranted, if clinically indicated. 6. There are calcifications of the aortic valve. Echocardiographic correlation for evaluation of potential valvular dysfunction may be warranted if clinically indicated. 7. Additional incidental findings, as above. Electronically Signed   By: Vinnie Langton M.D.   On: 04/23/2020 18:44   DG Chest Port 1 View  Result Date: 04/23/2020 CLINICAL DATA:  Dyspnea, worsening shortness of breath and productive cough EXAM: PORTABLE CHEST 1 VIEW COMPARISON:  Radiograph 03/10/2020, CT  12/14/2019 FINDINGS: Chronically low lung volumes with some central vascular congestion similar to the comparison exam. No convincing features of frank edema at this time. No new focal areas of consolidation. No effusion or pneumothorax is visible. Mild cardiomegaly is similar to prior accounting for differences in technique. Telemetry leads overlie the chest. No acute osseous or soft tissue abnormality. Degenerative changes are present in the imaged spine and shoulders. Dextrocurvature of the thoracic spine similar to prior. IMPRESSION: Chronically low lung volumes with some central vascular congestion. No convincing features of frank edema or other acute cardiopulmonary abnormality. Stable cardiomegaly. Electronically Signed   By: Lovena Le M.D.   On: 04/23/2020 15:18    EKG: Independently reviewed. Interpretation : Sinus tachycardia though read as ectopic tachycardia  Assessment/Plan ACELIN FERDIG is a 69 y.o. male with medical history significant for hepatic cirrhosis with esophageal varices and portal hypertension gastropathy, HFpEF, last EF 55 to 60% March 2021, paroxysmal A. fib not on anticoagulation,  insulin-dependent type 2 diabetes, CKD stage IV, hypertension, hyperlipidemia, chronic venous stasis dermatitis and edema bilateral lower extremities, anxiety, and obesity who is admitted with sepsis of unknown source and respiratory failure with concern for fluid overload  Sepsis suspected ([place])   Acute gastroenteritis -Patient with fever, tachycardia and tachypnea with normal WBC but lactic acidosis of 3 and recent reports of diarrhea, with vomiting while in the ER -Patient does have history of hypertensive gastropathy -Follow-up Covid PCR and urinalysis to assess for other potential sources of sepsis -Continue broad-spectrum antibiotics until more definite source, with cefepime vancomycin and metronidazole with quick de-escalation if no source found -Continue IV fluid hydration and will on the side of caution given stronger evidence for heart failure exacerbation versus sepsis -GI panel and stool for C. difficile.    Acute on chronic diastolic CHF (congestive heart failure) ([place]) -Patient presents with shortness of breath, pedal edema, recently seen by cardiologist on 04/20/2020 with up titration of diuretic therapy and recommendation for admission at the time of IV diuretic therapy -IV Lasix infusion per Dr Johnsie Cancel via secure chat. -Daily weights with salt and fluid restriction -Echocardiogram.  Last echo was 3-17 with EF 60 to 65% -Consult cardiology.  Right heart catheterization was being considered at his recent appointment -Avoid beta-blockers given that patient had first-degree heart block cardiology appointment on 04/20/2020 -Avoid ACE inhibitor due to poor renal function    Acute hypoxemic respiratory failure ([place]) -Patient had increased work of breathing, tachypneic to 32, with symptomatic relief with O2.  BiPAP was being considered in the ER but deferred due to vomiting -Continue O2 to keep sats over 92% -Etiology suspect related to CHF, possibly due to sepsis  Elevated troponin  Possible NSTEMI -Addendum: Following admission, troponin bumped 18>>578 -Suspect demand ischemia, though patient does have high risk factors for CAD -No heparin infusion, Given no chest pain and no EKG changes, severe anemia, thrombocytopenia with history of esophageal varices and hypertensive gastropathy --Discussed with Cardiologist Dr. Johnsie Cancel via secure chat    Cirrhosis [facility])   Esophageal varices ([place]) -Appears stable.  No acute concerns    Chronic anemia -Hemoglobin 7.9 which is around his baseline of 7.6    Chronic venous stasis dermatitis of both lower extremities -Keep legs elevated    Obesity, Class III, BMI 40-49.9 (morbid obesity) ([place]) -This complicates overall prognosis and care    Type 2 diabetes mellitus with diabetic nephropathy, with long-term current use of insulin (HCC) -Sliding scale insulin coverage for now, pending med  rec    CKD (chronic kidney disease) stage 4, GFR 15-29 ml/min (HCC) -Creatinine 2.85, baseline around 3    Paroxysmal atrial fibrillation (HCC) -Currently in sinus rhythm -Patient is not on systemic anticoagulation due to history of esophageal varices with portal hypertensive gastropathy    COPD with chronic bronchitis (HCC) -Not currently wheezing.  DuoNeb as needed.    DVT prophylaxis: Lovenox  Code Status: full code  Family Communication:  none  Disposition Plan: Back to previous home environment Consults called: Cardiology Status:At the time of admission, it appears that the appropriate admission status for this patient is INPATIENT. This is judged to be reasonable and necessary in order to provide the required intensity of service to ensure the patient's safety given the presenting symptoms, physical exam findings, and initial radiographic and laboratory data in the context of their  Comorbid conditions.   Patient requires inpatient status due to high intensity of service, high risk for further deterioration and high frequency of  surveillance required.   I certify that at the point of admission it is my clinical judgment that the patient will require inpatient hospital care spanning beyond [place] MD [facility]     04/23/2020, 7:37 PM

## 2020-04-23 NOTE — Progress Notes (Signed)
Went to place patient on BIPAP and patient had just vomited, was not able to put patient on BIPAP at this time.

## 2020-04-23 NOTE — Progress Notes (Signed)
Spoke with Bryna Colander, RN to follow up about patient vomiting. She stated Dr. Cherylann Banas said to hold BIPAP, patient's breathing was better.

## 2020-04-23 NOTE — Progress Notes (Signed)
Notified bedside nurse of need to order and draw repeat lactic acid.

## 2020-04-23 NOTE — ED Notes (Signed)
Report given to Jenna, RN

## 2020-04-24 ENCOUNTER — Inpatient Hospital Stay (HOSPITAL_COMMUNITY)
Admit: 2020-04-24 | Discharge: 2020-04-24 | Disposition: A | Discharge status: Home / Self Care | Payer: Medicare Other | Attending: Internal Medicine | Admitting: Internal Medicine

## 2020-04-24 ENCOUNTER — Encounter: Payer: Self-pay | Admitting: Internal Medicine

## 2020-04-24 DIAGNOSIS — K7460 Unspecified cirrhosis of liver: Secondary | ICD-10-CM

## 2020-04-24 DIAGNOSIS — R601 Generalized edema: Secondary | ICD-10-CM

## 2020-04-24 DIAGNOSIS — I5031 Acute diastolic (congestive) heart failure: Secondary | ICD-10-CM

## 2020-04-24 LAB — BASIC METABOLIC PANEL
Anion gap: 9 (ref 5–15)
BUN: 64 mg/dL — ABNORMAL HIGH (ref 8–23)
CO2: 25 mmol/L (ref 22–32)
Calcium: 8.2 mg/dL — ABNORMAL LOW (ref 8.9–10.3)
Chloride: 104 mmol/L (ref 98–111)
Creatinine, Ser: 3.03 mg/dL — ABNORMAL HIGH (ref 0.61–1.24)
GFR calc Af Amer: 23 mL/min — ABNORMAL LOW (ref >60)
GFR calc non Af Amer: 20 mL/min — ABNORMAL LOW (ref >60)
Glucose, Bld: 154 mg/dL — ABNORMAL HIGH (ref 70–99)
Potassium: 3.3 mmol/L — ABNORMAL LOW (ref 3.5–5.1)
Sodium: 138 mmol/L (ref 135–145)

## 2020-04-24 LAB — BLOOD CULTURE ID PANEL (REFLEXED)

## 2020-04-24 LAB — GLUCOSE, CAPILLARY
Glucose-Capillary: 207 mg/dL — ABNORMAL HIGH (ref 70–99)
Glucose-Capillary: 173 mg/dL — ABNORMAL HIGH (ref 70–99)
Glucose-Capillary: 170 mg/dL — ABNORMAL HIGH (ref 70–99)
Glucose-Capillary: 160 mg/dL — ABNORMAL HIGH (ref 70–99)

## 2020-04-24 MED ORDER — OXYCODONE HCL 5 MG PO TABS
5.0000 mg | ORAL_TABLET | Freq: Three times a day (TID) | ORAL | Status: DC | PRN
  Administered 2020-04-24 – 2020-04-27 (×9): 5 mg via ORAL
  Filled 2020-04-24 (×11): qty 1

## 2020-04-24 MED ORDER — IPRATROPIUM-ALBUTEROL 0.5-2.5 (3) MG/3ML IN SOLN
3.0000 mL | Freq: Four times a day (QID) | RESPIRATORY_TRACT | Status: DC
  Administered 2020-04-24: 3 mL via RESPIRATORY_TRACT
  Filled 2020-04-24 (×2): qty 3

## 2020-04-24 MED ORDER — PANTOPRAZOLE SODIUM 40 MG PO TBEC
40.0000 mg | DELAYED_RELEASE_TABLET | Freq: Two times a day (BID) | ORAL | Status: DC
  Administered 2020-04-24 – 2020-05-03 (×19): 40 mg via ORAL
  Filled 2020-04-24 (×19): qty 1

## 2020-04-24 MED ORDER — AMLODIPINE BESYLATE 5 MG PO TABS
5.0000 mg | ORAL_TABLET | Freq: Every day | ORAL | Status: DC
  Administered 2020-04-24 – 2020-04-28 (×5): 5 mg via ORAL
  Filled 2020-04-24 (×5): qty 1

## 2020-04-24 MED ORDER — FERROUS SULFATE 325 (65 FE) MG PO TABS
325.0000 mg | ORAL_TABLET | Freq: Every day | ORAL | Status: DC
  Administered 2020-04-24 – 2020-05-03 (×10): 325 mg via ORAL
  Filled 2020-04-24 (×10): qty 1

## 2020-04-24 MED ORDER — LOSARTAN POTASSIUM 25 MG PO TABS
25.0000 mg | ORAL_TABLET | Freq: Every day | ORAL | Status: DC
  Administered 2020-04-25 – 2020-05-01 (×7): 25 mg via ORAL
  Filled 2020-04-24 (×7): qty 1

## 2020-04-24 MED ORDER — RIFAXIMIN 550 MG PO TABS
550.0000 mg | ORAL_TABLET | Freq: Two times a day (BID) | ORAL | Status: DC
  Administered 2020-04-24 – 2020-05-03 (×19): 550 mg via ORAL
  Filled 2020-04-24 (×20): qty 1

## 2020-04-24 MED ORDER — HYDRALAZINE HCL 25 MG PO TABS
25.0000 mg | ORAL_TABLET | Freq: Two times a day (BID) | ORAL | Status: DC
  Administered 2020-04-24 – 2020-04-28 (×9): 25 mg via ORAL
  Filled 2020-04-24 (×9): qty 1

## 2020-04-24 MED ORDER — IPRATROPIUM-ALBUTEROL 20-100 MCG/ACT IN AERS: 1.0000 | INHALATION_SPRAY | Freq: Four times a day (QID) | RESPIRATORY_TRACT | Status: DC

## 2020-04-24 MED ORDER — TRAMADOL HCL 50 MG PO TABS: 50.0000 mg | ORAL_TABLET | Freq: Three times a day (TID) | ORAL | Status: DC | PRN

## 2020-04-24 MED ORDER — POTASSIUM CHLORIDE CRYS ER 10 MEQ PO TBCR
40.0000 meq | EXTENDED_RELEASE_TABLET | Freq: Every day | ORAL | Status: DC
  Administered 2020-04-24: 40 meq via ORAL
  Filled 2020-04-24: qty 4

## 2020-04-24 MED ORDER — LACTULOSE 10 GM/15ML PO SOLN
60.0000 g | Freq: Every day | ORAL | Status: DC
  Administered 2020-04-25 – 2020-05-02 (×7): 60 g via ORAL
  Filled 2020-04-24 (×8): qty 90

## 2020-04-24 MED ORDER — SPIRONOLACTONE 25 MG PO TABS
25.0000 mg | ORAL_TABLET | Freq: Every day | ORAL | Status: DC
  Administered 2020-04-24 – 2020-05-01 (×8): 25 mg via ORAL
  Filled 2020-04-24 (×8): qty 1

## 2020-04-24 MED ORDER — LORAZEPAM 0.5 MG PO TABS
0.5000 mg | ORAL_TABLET | Freq: Three times a day (TID) | ORAL | Status: DC | PRN
  Administered 2020-04-25 – 2020-05-03 (×8): 0.5 mg via ORAL
  Filled 2020-04-24 (×8): qty 1

## 2020-04-24 MED ORDER — SODIUM CHLORIDE 0.9 % IV SOLN
2.0000 g | INTRAVENOUS | Status: DC
  Administered 2020-04-25 – 2020-04-26 (×2): 2 g via INTRAVENOUS
  Filled 2020-04-24: qty 2
  Filled 2020-04-24: qty 20

## 2020-04-24 MED ORDER — ATORVASTATIN CALCIUM 20 MG PO TABS
20.0000 mg | ORAL_TABLET | Freq: Every day | ORAL | Status: DC
  Administered 2020-04-24 – 2020-05-03 (×10): 20 mg via ORAL
  Filled 2020-04-24 (×10): qty 1

## 2020-04-24 MED ORDER — SALINE SPRAY 0.65 % NA SOLN
1.0000 | NASAL | Status: DC | PRN
  Filled 2020-04-24 (×2): qty 44

## 2020-04-24 NOTE — Progress Notes (Signed)
PHARMACY - PHYSICIAN COMMUNICATION CRITICAL VALUE ALERT - BLOOD CULTURE IDENTIFICATION (BCID)  Corey West is an 69 y.o. male who presented to [facility] on 04/23/2020 with a chief complaint of sepsis  Assessment:  Lab reports + BCx, GNR, Klebsiella pneumo, KPC not detected  Name of physician (or Provider) Contacted: Jonny Ruiz NP  Current antibiotics: Vancomycin, Flagyl, Cefepime  Changes to prescribed antibiotics recommended: Deescalate to Rocephin 2gm IV q24hrs Recommendations accepted by provider  Results for orders placed or performed during the hospital encounter of 04/23/20  Blood Culture ID Panel (Reflexed) (Collected: 04/23/2020  3:06 PM)  Result Value Ref Range   Enterococcus species NOT DETECTED NOT DETECTED   Listeria monocytogenes NOT DETECTED NOT DETECTED   Staphylococcus species NOT DETECTED NOT DETECTED   Staphylococcus aureus (BCID) NOT DETECTED NOT DETECTED   Streptococcus species NOT DETECTED NOT DETECTED   Streptococcus agalactiae NOT DETECTED NOT DETECTED   Streptococcus pneumoniae NOT DETECTED NOT DETECTED   Streptococcus pyogenes NOT DETECTED NOT DETECTED   Acinetobacter baumannii NOT DETECTED NOT DETECTED   Enterobacteriaceae species DETECTED (A) NOT DETECTED   Enterobacter cloacae complex NOT DETECTED NOT DETECTED   Escherichia coli NOT DETECTED NOT DETECTED   Klebsiella oxytoca NOT DETECTED NOT DETECTED   Klebsiella pneumoniae DETECTED (A) NOT DETECTED   Proteus species NOT DETECTED NOT DETECTED   Serratia marcescens NOT DETECTED NOT DETECTED   Carbapenem resistance NOT DETECTED NOT DETECTED   Haemophilus influenzae NOT DETECTED NOT DETECTED   Neisseria meningitidis NOT DETECTED NOT DETECTED   Pseudomonas aeruginosa NOT DETECTED NOT DETECTED   Candida albicans NOT DETECTED NOT DETECTED   Candida glabrata NOT DETECTED NOT DETECTED   Candida krusei NOT DETECTED NOT DETECTED   Candida parapsilosis NOT DETECTED NOT DETECTED   Candida tropicalis NOT DETECTED  NOT DETECTED    Hart Robinsons A 04/24/2020  6:38 AM

## 2020-04-24 NOTE — Progress Notes (Signed)
*  PRELIMINARY RESULTS* Echocardiogram 2D Echocardiogram has been performed.  Jonette Mate Dujuan Stankowski 04/24/2020, 2:39 PM

## 2020-04-24 NOTE — Progress Notes (Addendum)
Progress Note    Corey West  WER:[id number] DOB: 03/07/1951  DOA: 04/23/2020 PCP: Leonel Ramsay, MD      Brief Narrative:    Medical records reviewed and are as summarized below:  Corey West is a 69 y.o. 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  He has also been having diarrhea this because he takes lactulose for his "ammonia".  He was febrile (101.8), tachycardic lactic acid was elevated at 3.0.  He was admitted to the hospital for sepsis and acute on chronic diastolic CHF with fluid overload.       Assessment/Plan:   Principal Problem:   Sepsis ([place]) Active Problems:   Cirrhosis ([place])   Esophageal varices (HCC)   Chronic anemia   Chronic venous stasis dermatitis of both lower extremities   Obesity, Class III, BMI 40-49.9 (morbid obesity) (HCC)   Type 2 diabetes mellitus with diabetic nephropathy, with long-term current use of insulin (HCC)   Acute on chronic diastolic CHF (congestive heart failure) (HCC)   CKD (chronic kidney disease) stage 4, GFR 15-29 ml/min (HCC)   Acute hypoxemic respiratory failure (HCC)   Paroxysmal atrial fibrillation (HCC)   Acute gastroenteritis   COPD with chronic bronchitis (HCC)    Sepsis: Klebsiella pneumonia bacteremia: Continue empiric IV Rocephin.  Follow-up blood culture sensitivity report.  Acute on chronic diastolic CHF/anasarca: Continue IV Lasix infusion.  Monitor BMP, daily weights and urine output.  Liver cirrhosis with anasarca, history  of esophageal varices: Continue diuretics, rifaximin and lactulose.  Elevated troponin: This is likely due to demand ischemia.  2D echo is pending.  Patient has been evaluated by cardiologist.  CKD stage IV: Monitor BMP.  Insulin-dependent diabetes mellitus, episode of hypoglycemia on 04/23/2020: NovoLog as needed for hyperglycemia  Hypertension: Continue antihypertensives  Paroxysmal atrial fibrillation: He is not on full dose anticoagulation because of liver disease, kidney disease, thrombocytopenia and chronic anemia.  Chronic venous stasis of bilateral lower extremities: Continue leg cramps.  Consult palliative care team because of multiple comorbidities   Body mass index is 43.62 kg/m.  (Morbid obesity): This complicates overall care and prognosis  Diet Order            Diet heart healthy/carb modified Room service appropriate? Yes; Fluid consistency: Thin  Diet effective now                       Medications:   . amLODipine  5 mg Oral Daily  . atorvastatin  20 mg Oral Daily  . enoxaparin (LOVENOX) injection  40 mg Subcutaneous Q12H  . ferrous sulfate  325 mg Oral Daily  . hydrALAZINE  25 mg Oral BID  . insulin aspart  0-20 Units Subcutaneous TID WC  . insulin aspart  0-5 Units Subcutaneous QHS  . ipratropium-albuterol  3 mL Nebulization Q6H  . lactulose  60 g Oral Daily  . [START ON 04/25/2020] losartan  25 mg Oral Daily  . pantoprazole  40 mg Oral BID  . potassium chloride  40 mEq Oral Daily  . rifaximin  550 mg Oral BID  .  sodium chloride flush  3 mL Intravenous Q12H  . spironolactone  25 mg Oral Daily   Continuous Infusions: . sodium chloride    . [START ON 04/25/2020] cefTRIAXone (ROCEPHIN)  IV    . furosemide (LASIX) infusion 10 mg/hr (04/23/20 2342)     Anti-infectives (From admission, onward)   Start     Dose/Rate Route Frequency Ordered Stop   04/25/20 0900  cefTRIAXone (ROCEPHIN) 2 g in sodium chloride 0.9 % 100 mL IVPB     Discontinue     2  g 200 mL/hr over 30 Minutes Intravenous Every 24 hours 04/24/20 0651     04/24/20 2200  vancomycin (VANCOREADY) IVPB 1500 mg/300 mL  Status:  Discontinued        1,500 mg 150 mL/hr over 120 Minutes Intravenous Every 24 hours 04/23/20 1952 04/24/20 0651   04/24/20 1100  rifaximin (XIFAXAN) tablet 550 mg     Discontinue     550 mg Oral 2 times daily 04/24/20 0955     04/23/20 2200  ceFEPIme (MAXIPIME) 2 g in sodium chloride 0.9 % 100 mL IVPB  Status:  Discontinued        2 g 200 mL/hr over 30 Minutes Intravenous Every 12 hours 04/23/20 1952 04/24/20 0650   04/23/20 1945  metroNIDAZOLE (FLAGYL) IVPB 500 mg  Status:  Discontinued        500 mg 100 mL/hr over 60 Minutes Intravenous Every 8 hours 04/23/20 1936 04/24/20 0649   04/23/20 1600  vancomycin (VANCOREADY) IVPB 1500 mg/300 mL        1,500 mg 150 mL/hr over 120 Minutes Intravenous  Once 04/23/20 1459 04/23/20 2154   04/23/20 1500  ceFEPIme (MAXIPIME) 2 g in sodium chloride 0.9 % 100 mL IVPB        2 g 200 mL/hr over 30 Minutes Intravenous  Once 04/23/20 1452 04/23/20 1640   04/23/20 1500  metroNIDAZOLE (FLAGYL) IVPB 500 mg        500 mg 100 mL/hr over 60 Minutes Intravenous  Once 04/23/20 1452 04/23/20 1640   04/23/20 1500  vancomycin (VANCOCIN) IVPB 1000 mg/200 mL premix        1,000 mg 200 mL/hr over 60 Minutes Intravenous  Once 04/23/20 1452 04/23/20 1805             Family Communication/Anticipated D/C date and plan/Code Status   DVT prophylaxis: enoxaparin (LOVENOX) injection 40 mg Start: 04/24/20 1000     Code Status: Full Code  Family Communication: Plan discussed with patient Disposition Plan:    Status is: Inpatient  Remains inpatient appropriate because:IV treatments appropriate due to intensity of illness or inability to take PO and Inpatient level of care appropriate due to severity of illness   Dispo: The patient is from: Home              Anticipated d/c is to: Home              Anticipated d/c date  is: 3 days              Patient currently is not medically stable to d/c.           Subjective:   He complains of pain in the lower abdomen and bilateral legs  Objective:    Vitals:   04/23/20 2132 04/24/20 0308 04/24/20 0807 04/24/20 1118  BP: (!) 126/58 (!) 136/65 (!) 110/55 (!) 128/58  Pulse: 81 63 70 64  Resp:  19 20 18   Temp: 98.7  F (37.1 C) 97.9 F (36.6 C) 98.2 F (36.8 C) 98 F (36.7 C)  TempSrc: Oral Oral Oral Oral  SpO2: 99%  97% 100%  Weight: (!) 138.1 kg (!) 137.9 kg    Height: 5\' 10"  (1.778 m)      No data found.   Intake/Output Summary (Last 24 hours) at 04/24/2020 1506 Last data filed at 04/24/2020 1127 Gross per 24 hour  Intake 400 ml  Output 1450 ml  Net -1050 ml   Filed Weights   04/23/20 1440 04/23/20 2132 04/24/20 0308  Weight: (!) 131.5 kg (!) 138.1 kg (!) 137.9 kg    Exam:  GEN: NAD SKIN: No rash EYES: EOMI ENT: MMM CV: RRR PULM: CTA B ABD: soft, obese and distended, lower abdominal tenderness but no rebound tenderness or guarding, +BS CNS: AAO x 3, non focal EXT: Bilateral lower extremity edema up to thighs.  Erythema on bilateral legs and erythema is more pronounced on the left leg.  Tenderness of bilateral legs.   Data Reviewed:   I have personally reviewed following labs and imaging studies:  Labs: Labs show the following:   Basic Metabolic Panel: Recent Labs  Lab 04/23/20 1506 04/24/20 0607  NA 141 138  K 3.5 3.3*  CL 104 104  CO2 24 25  GLUCOSE 121* 154*  BUN 63* 64*  CREATININE 2.85* 3.03*  CALCIUM 9.0 8.2*   GFR Estimated Creatinine Clearance: 32.7 mL/min (A) (by C-G formula based on SCr of 3.03 mg/dL (H)). Liver Function Tests: Recent Labs  Lab 04/23/20 1506  AST 32  ALT 19  ALKPHOS 91  BILITOT 1.0  PROT 7.7  ALBUMIN 3.4*   No results for input(s): LIPASE, AMYLASE in the last 168 hours. No results for input(s): AMMONIA in the last 168 hours. Coagulation profile Recent Labs  Lab  04/23/20 1506  INR 1.3*    CBC: Recent Labs  Lab 04/23/20 1506  WBC 9.6  NEUTROABS 8.8*  HGB 7.9*  HCT 24.5*  MCV 94.2  PLT 123*   Cardiac Enzymes: No results for input(s): CKTOTAL, CKMB, CKMBINDEX, TROPONINI in the last 168 hours. BNP (last 3 results) No results for input(s): PROBNP in the last 8760 hours. CBG: Recent Labs  Lab 04/23/20 2137 04/23/20 2201 04/24/20 0851 04/24/20 1119  GLUCAP 52* 84 207* 173*   D-Dimer: No results for input(s): DDIMER in the last 72 hours. Hgb A1c: No results for input(s): HGBA1C in the last 72 hours. Lipid Profile: No results for input(s): CHOL, HDL, LDLCALC, TRIG, CHOLHDL, LDLDIRECT in the last 72 hours. Thyroid function studies: No results for input(s): TSH, T4TOTAL, T3FREE, THYROIDAB in the last 72 hours.  Invalid input(s): FREET3 Anemia work up: No results for input(s): VITAMINB12, FOLATE, FERRITIN, TIBC, IRON, RETICCTPCT in the last 72 hours. Sepsis Labs: Recent Labs  Lab 04/23/20 1506 04/23/20 1944 04/23/20 2142  WBC 9.6  --   --   LATICACIDVEN 3.0* 1.2 1.3    Microbiology Recent Results (from the past 240 hour(s))  Blood Culture (routine x 2)     Status: None (Preliminary result)   Collection Time: 04/23/20  3:06 PM   Specimen: BLOOD  Result Value Ref Range Status   Specimen Description BLOOD RAC  Final   Special Requests   Final    BOTTLES DRAWN AEROBIC AND ANAEROBIC Blood Culture results may not be optimal due to an inadequate volume of blood received in culture bottles   Culture   Final    NO GROWTH <  24 HOURS Performed at [facility], [address] [place], [place] [address]    Report Status PENDING  Incomplete  Blood Culture (routine x 2)     Status: None (Preliminary result)   Collection Time: 04/23/20  3:06 PM   Specimen: BLOOD RIGHT FOREARM  Result Value Ref Range Status   Specimen Description   Final    BLOOD RIGHT FOREARM Performed at [facility], [place] [address],  [place], [place] [address]    Special Requests   Final    BOTTLES DRAWN AEROBIC AND ANAEROBIC Blood Culture adequate volume Performed at [facility], [address] [place], [place] [address]    Culture  Setup Time   Final    GRAM NEGATIVE RODS IN BOTH AEROBIC AND ANAEROBIC BOTTLES CRITICAL RESULT CALLED TO, READ BACK BY AND VERIFIED WITH: SCOTT HALL 04/24/20 AT 0618 HS Performed at [facility], [address] [place], [place] [address]    Culture GRAM NEGATIVE RODS  Final   Report Status PENDING  Incomplete  Blood Culture ID Panel (Reflexed)     Status: Abnormal   Collection Time: 04/23/20  3:06 PM  Result Value Ref Range Status   Enterococcus species NOT DETECTED NOT DETECTED Final   Listeria monocytogenes NOT DETECTED NOT DETECTED Final   Staphylococcus species NOT DETECTED NOT DETECTED Final   Staphylococcus aureus (BCID) NOT DETECTED NOT DETECTED Final   Streptococcus species NOT DETECTED NOT DETECTED Final   Streptococcus agalactiae NOT DETECTED NOT DETECTED Final   Streptococcus pneumoniae NOT DETECTED NOT DETECTED Final   Streptococcus pyogenes NOT DETECTED NOT DETECTED Final   Acinetobacter baumannii NOT DETECTED NOT DETECTED Final   Enterobacteriaceae species DETECTED (A) NOT DETECTED Final    Comment: Enterobacteriaceae represent a large family of gram-negative bacteria, not a single organism. CRITICAL RESULT CALLED TO, READ BACK BY AND VERIFIED WITH: SCOTT HALL 04/24/20 AT 0618 HS    Enterobacter cloacae complex NOT DETECTED NOT DETECTED Final   Escherichia coli NOT DETECTED NOT DETECTED Final   Klebsiella oxytoca NOT DETECTED NOT DETECTED Final   Klebsiella pneumoniae DETECTED (A) NOT DETECTED Final    Comment: CRITICAL RESULT CALLED TO, READ BACK BY AND VERIFIED WITH: SCOTT HALL 04/24/20 AT 0618 HS    Proteus species NOT DETECTED NOT DETECTED Final   Serratia marcescens NOT DETECTED NOT DETECTED Final   Carbapenem resistance NOT DETECTED NOT DETECTED  Final   Haemophilus influenzae NOT DETECTED NOT DETECTED Final   Neisseria meningitidis NOT DETECTED NOT DETECTED Final   Pseudomonas aeruginosa NOT DETECTED NOT DETECTED Final   Candida albicans NOT DETECTED NOT DETECTED Final   Candida glabrata NOT DETECTED NOT DETECTED Final   Candida krusei NOT DETECTED NOT DETECTED Final   Candida parapsilosis NOT DETECTED NOT DETECTED Final   Candida tropicalis NOT DETECTED NOT DETECTED Final    Comment: Performed at [facility], [address] [place], [place] [address]  SARS Coronavirus 2 by RT PCR (hospital order, performed in [place] hospital lab)     Status: None   Collection Time: 04/23/20  3:46 PM  Result Value Ref Range Status   SARS Coronavirus 2 NEGATIVE NEGATIVE Final    Comment: (NOTE) SARS-CoV-2 target nucleic acids are NOT DETECTED.  The SARS-CoV-2 RNA is generally detectable in upper and lower respiratory specimens during the acute phase of infection. The lowest concentration of SARS-CoV-2 viral copies this assay can detect is 250 copies / mL. A negative result does  not preclude SARS-CoV-2 infection and should not be used as the sole basis for treatment or other patient management decisions.  A negative result may occur with improper specimen collection / handling, submission of specimen other than nasopharyngeal swab, presence of viral mutation(s) within the areas targeted by this assay, and inadequate number of viral copies (<250 copies / mL). A negative result must be combined with clinical observations, patient history, and epidemiological information.  Fact Sheet for Patients:   StrictlyIdeas.no  Fact Sheet for Healthcare Providers: BankingDealers.co.za  This test is not yet approved or  cleared by the [place] FDA and has been authorized for detection and/or diagnosis of SARS-CoV-2 by FDA under an Emergency Use Authorization (EUA).  This EUA will  remain in effect (meaning this test can be used) for the duration of the COVID-19 declaration under Section 564(b)(1) of the Act, 21 U.S.C. section 360bbb-3(b)(1), unless the authorization is terminated or revoked sooner.  Performed at [facility], [address] [place], [place] [address]     Procedures and diagnostic studies:  CT ABDOMEN PELVIS WO CONTRAST  Result Date: 04/23/2020 CLINICAL DATA:  69 year old male with history of abdominal pain and fever. EXAM: CT ABDOMEN AND PELVIS WITHOUT CONTRAST TECHNIQUE: Multidetector CT imaging of the abdomen and pelvis was performed following the standard protocol without IV contrast. COMPARISON:  No priors. FINDINGS: Lower chest: Trace left pleural effusion lying dependently. Diffuse low attenuation throughout the intravascular compartment, suggestive of anemia. Atherosclerotic calcifications in the left anterior descending, left circumflex and right coronary arteries. Calcifications of the aortic valve. Hepatobiliary: Liver has a shrunken appearance and nodular contour, indicative of underlying cirrhosis. Calcified granulomas are noted in the liver. No discrete cystic or solid hepatic lesions are other identified on today's noncontrast CT examination. Calcified gallstones are noted lying dependently in the gallbladder measuring up to 9 mm. Gallbladder is not distended. Pancreas: No definite pancreatic mass or peripancreatic fluid collections or inflammatory changes are noted on today's noncontrast CT examination. Spleen: Spleen is mildly enlarged measuring 16.7 x 7.1 x 16.9 cm (estimated splenic volume of 1,002 mL) . Adrenals/Urinary Tract: There are no abnormal calcifications within the collecting system of either kidney, along the course of either ureter, or within the lumen of the urinary bladder. No hydroureteronephrosis or perinephric stranding to suggest urinary tract obstruction at this time. The unenhanced appearance of the kidneys is  unremarkable bilaterally. Unenhanced appearance of the urinary bladder is normal. Bilateral adrenal glands are normal in appearance. Stomach/Bowel: Unenhanced appearance of the stomach is normal. No pathologic dilatation of small bowel or colon. Appendix is not confidently identified may be surgically absent. Vascular/Lymphatic: Aortic atherosclerosis. Numerous portosystemic collateral pathways, most notable for large splenorenal collateral vessels. No lymphadenopathy noted in the abdomen or pelvis. Reproductive: Prostate gland and seminal vesicles are unremarkable in appearance. Large right-sided hydrocele incompletely imaged. Other: Small umbilical hernia containing only omental fat. Small volume of ascites. No pneumoperitoneum. Musculoskeletal: There are no aggressive appearing lytic or blastic lesions noted in the visualized portions of the skeleton. IMPRESSION: 1. Morphologic changes in the liver compatible with cirrhosis. This is associated with evidence of portal venous hypertension, including splenomegaly and numerous portosystemic collateral vessels. 2. Cholelithiasis without evidence of acute cholecystitis at this time. Small umbilical hernia containing only omental fat large right-sided hydrocele incompletely imaged. 3. Trace left pleural effusion lying dependent 4. Diffuse low attenuation throughout the intravascular compartment, suggesting anemia. 5. Aortic atherosclerosis, in addition to least 3 vessel coronary artery disease. Please note  that although the presence of coronary artery calcium documents the presence of coronary artery disease, the severity of this disease and any potential stenosis cannot be assessed on this non-gated CT examination. Assessment for potential risk factor modification, dietary therapy or pharmacologic therapy may be warranted, if clinically indicated. 6. There are calcifications of the aortic valve. Echocardiographic correlation for evaluation of potential valvular  dysfunction may be warranted if clinically indicated. 7. Additional incidental findings, as above. Electronically Signed   By: Vinnie Langton M.D.   On: 04/23/2020 18:44   DG Chest Port 1 View  Result Date: 04/23/2020 CLINICAL DATA:  Dyspnea, worsening shortness of breath and productive cough EXAM: PORTABLE CHEST 1 VIEW COMPARISON:  Radiograph 03/10/2020, CT 12/14/2019 FINDINGS: Chronically low lung volumes with some central vascular congestion similar to the comparison exam. No convincing features of frank edema at this time. No new focal areas of consolidation. No effusion or pneumothorax is visible. Mild cardiomegaly is similar to prior accounting for differences in technique. Telemetry leads overlie the chest. No acute osseous or soft tissue abnormality. Degenerative changes are present in the imaged spine and shoulders. Dextrocurvature of the thoracic spine similar to prior. IMPRESSION: Chronically low lung volumes with some central vascular congestion. No convincing features of frank edema or other acute cardiopulmonary abnormality. Stable cardiomegaly. Electronically Signed   By: Lovena Le M.D.   On: 04/23/2020 15:18               LOS: 1 day   Gerard Cantara  [facility]     04/24/2020, 3:06 PM

## 2020-04-24 NOTE — Plan of Care (Signed)
°  Problem: Education: Goal: Knowledge of General Education information will improve Description: Including pain rating scale, medication(s)/side effects and non-pharmacologic comfort measures Outcome: Progressing   Problem: Health Behavior/Discharge Planning: Goal: Ability to manage health-related needs will improve Outcome: Progressing   Problem: Clinical Measurements: Goal: Ability to maintain clinical measurements within normal limits will improve Outcome: Progressing Goal: Will remain free from infection Outcome: Progressing Goal: Diagnostic test results will improve Outcome: Progressing Goal: Respiratory complications will improve Outcome: Progressing Goal: Cardiovascular complication will be avoided Outcome: Progressing   Problem: Activity: Goal: Risk for activity intolerance will decrease Outcome: Progressing   Problem: Nutrition: Goal: Adequate nutrition will be maintained Outcome: Progressing   Problem: Coping: Goal: Level of anxiety will decrease Outcome: Progressing   Problem: Elimination: Goal: Will not experience complications related to bowel motility Outcome: Progressing Goal: Will not experience complications related to urinary retention Outcome: Progressing   Problem: Elimination: Goal: Will not experience complications related to urinary retention Outcome: Progressing   Problem: Pain Managment: Goal: General experience of comfort will improve Outcome: Progressing   Problem: Safety: Goal: Ability to remain free from injury will improve Outcome: Progressing   Problem: Skin Integrity: Goal: Risk for impaired skin integrity will decrease Outcome: Progressing   Problem: Education: Goal: Ability to demonstrate management of disease process will improve Outcome: Progressing Goal: Ability to verbalize understanding of medication therapies will improve Outcome: Progressing Goal: Individualized Educational Video(s) Outcome: Progressing   Problem:  Activity: Goal: Capacity to carry out activities will improve Outcome: Progressing   Problem: Cardiac: Goal: Ability to achieve and maintain adequate cardiopulmonary perfusion will improve Outcome: Progressing

## 2020-04-24 NOTE — Consult Note (Signed)
CARDIOLOGY CONSULT NOTE       Patient ID: PINK MAYE MRN: [id number] DOB/AGE: [birth year] 68 y.o.  Admit date: 04/23/2020 Referring Physician: Damita Dunnings Primary Physician: Leonel Ramsay, MD Primary Cardiologist: Minta Balsam  Reason for Consultation: Anasarca/CHF  Principal Problem:   Sepsis [facility]) Active Problems:   Cirrhosis ([place])   Esophageal varices ([place])   Chronic anemia   Chronic venous stasis dermatitis of both lower extremities   Obesity, Class III, BMI 40-49.9 (morbid obesity) ([place])   Type 2 diabetes mellitus with diabetic nephropathy, with long-term current use of insulin (HCC)   Acute on chronic diastolic CHF (congestive heart failure) (HCC)   CKD (chronic kidney disease) stage 4, GFR 15-29 ml/min (HCC)   Acute hypoxemic respiratory failure (HCC)   Paroxysmal atrial fibrillation (HCC)   Acute gastroenteritis   COPD with chronic bronchitis (HCC)   HPI:  69 y.o.   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 He was seen during a hospitalization in May by Dr. Ubaldo Glassing of [facility].  He was admitted at that time with worsening leg edema and shortness of breath.  He was diuresed; anticoagulation was deferred given history of esophageal varices with portal hypertensive gastropathy.   His diuretics have been adjusted by Dr Ola Spurr to include zaroxyln, aldactone and lasix but patient was confused about dosing And has not been taking correctly. Seen by Dr End 04/20/20 noted 34 lb weight gain with anasarca and recommended hospitalization to patient for iv diuresis Patient deferred but then was admitted last night. He if very uncomfortable from tense abdomen and legs No chest pain.   ROS All other systems reviewed and negative except as noted above  Past Medical History:  Diagnosis Date  . Anemia   . Anxiety    controlled;   .  Arthritis   . AVM (arteriovenous malformation) of stomach, acquired with hemorrhage   . Barrett's esophagus   . CHF (congestive heart failure) ([place])   . Chronic kidney disease    renal infufficiency  . Cirrhosis ([place])   . Depression    controlled;   Marland Kitchen Diabetes mellitus without complication ([place])    not controlled, taking insulin but sugar continues to run high;   . Edema   . Esophageal varices ([place])   . GAVE (gastric antral vascular ectasia)   . GERD (gastroesophageal reflux disease)   . History of hiatal hernia   . Hyperlipidemia   . Hypertension    controlled well;   . Liver cirrhosis ([place])   . Nephropathy, diabetic ([place])   . Obesity   . Pancytopenia ([place])   . Polyp, stomach    with chronic blood loss  . Sleep apnea    does not wear a cpap, Medicare would not pay for it   . Venous stasis dermatitis of both lower extremities   . Venous stasis of both lower extremities    with cellulitis    Family History  Problem Relation Age of Onset  . Diabetes Other   . Transient ischemic attack Father   . CAD Father     Social History   Socioeconomic History  . Marital status: Single    Spouse name: Not on file  . Number of children: Not on file  . Years of education: Not on file  . Highest education level: Not on file  Occupational History  . Occupation: disabled  Tobacco Use  .  Smoking status: Former Smoker    Years: 20.00    Types: Cigars, Cigarettes  . Smokeless tobacco: Never Used  Vaping Use  . Vaping Use: Never used  Substance and Sexual Activity  . Alcohol use: No    Comment: stopped 15 years ago  . Drug use: No  . Sexual activity: Not Currently  Other Topics Concern  . Not on file  Social History Narrative   Lives alone, neighbor helps occasionally    Social Determinants of Health   Financial Resource Strain:   . Difficulty of Paying Living Expenses:   Food Insecurity:   . Worried About Charity fundraiser in the Last Year:   . Arboriculturist in the  Last Year:   Transportation Needs:   . Film/video editor (Medical):   Marland Kitchen Lack of Transportation (Non-Medical):   Physical Activity:   . Days of Exercise per Week:   . Minutes of Exercise per Session:   Stress:   . Feeling of Stress :   Social Connections:   . Frequency of Communication with Friends and Family:   . Frequency of Social Gatherings with Friends and Family:   . Attends Religious Services:   . Active Member of Clubs or Organizations:   . Attends Archivist Meetings:   Marland Kitchen Marital Status:   Intimate Partner Violence:   . Fear of Current or Ex-Partner:   . Emotionally Abused:   Marland Kitchen Physically Abused:   . Sexually Abused:     Past Surgical History:  Procedure Laterality Date  . ESOPHAGOGASTRODUODENOSCOPY N/A 02/09/2015   Procedure: ESOPHAGOGASTRODUODENOSCOPY (EGD);  Surgeon: Manya Silvas, MD;  Location: [facility] ENDOSCOPY;  Service: Endoscopy;  Laterality: N/A;  . ESOPHAGOGASTRODUODENOSCOPY N/A 07/22/2015   Procedure: ESOPHAGOGASTRODUODENOSCOPY (EGD);  Surgeon: Manya Silvas, MD;  Location: [facility] ENDOSCOPY;  Service: Endoscopy;  Laterality: N/A;  . ESOPHAGOGASTRODUODENOSCOPY N/A 06/26/2017   Procedure: ESOPHAGOGASTRODUODENOSCOPY (EGD);  Surgeon: Manya Silvas, MD;  Location: [facility] ENDOSCOPY;  Service: Endoscopy;  Laterality: N/A;  . ESOPHAGOGASTRODUODENOSCOPY N/A 06/27/2017   Procedure: ESOPHAGOGASTRODUODENOSCOPY (EGD);  Surgeon: Manya Silvas, MD;  Location: [facility] ENDOSCOPY;  Service: Endoscopy;  Laterality: N/A;  . ESOPHAGOGASTRODUODENOSCOPY N/A 06/28/2017   Procedure: ESOPHAGOGASTRODUODENOSCOPY (EGD);  Surgeon: Manya Silvas, MD;  Location: [facility] ENDOSCOPY;  Service: Endoscopy;  Laterality: N/A;  . ESOPHAGOGASTRODUODENOSCOPY Bilateral 10/11/2017   Procedure: ESOPHAGOGASTRODUODENOSCOPY (EGD);  Surgeon: Manya Silvas, MD;  Location: [facility] ENDOSCOPY;  Service: Endoscopy;  Laterality: Bilateral;  . ESOPHAGOGASTRODUODENOSCOPY N/A 12/04/2017   Procedure:  ESOPHAGOGASTRODUODENOSCOPY (EGD);  Surgeon: Manya Silvas, MD;  Location: [facility] ENDOSCOPY;  Service: Endoscopy;  Laterality: N/A;  . ESOPHAGOGASTRODUODENOSCOPY N/A 03/06/2019   Procedure: ESOPHAGOGASTRODUODENOSCOPY (EGD);  Surgeon: Toledo, Benay Pike, MD;  Location: [facility] ENDOSCOPY;  Service: Gastroenterology;  Laterality: N/A;  . ESOPHAGOGASTRODUODENOSCOPY (EGD) WITH PROPOFOL N/A 07/20/2015   Procedure: ESOPHAGOGASTRODUODENOSCOPY (EGD) WITH PROPOFOL;  Surgeon: Manya Silvas, MD;  Location: [facility] ENDOSCOPY;  Service: Endoscopy;  Laterality: N/A;  . ESOPHAGOGASTRODUODENOSCOPY (EGD) WITH PROPOFOL N/A 09/16/2015   Procedure: ESOPHAGOGASTRODUODENOSCOPY (EGD) WITH PROPOFOL;  Surgeon: Manya Silvas, MD;  Location: [facility] ENDOSCOPY;  Service: Endoscopy;  Laterality: N/A;  . ESOPHAGOGASTRODUODENOSCOPY (EGD) WITH PROPOFOL N/A 03/16/2016   Procedure: ESOPHAGOGASTRODUODENOSCOPY (EGD) WITH PROPOFOL;  Surgeon: Manya Silvas, MD;  Location: [facility] ENDOSCOPY;  Service: Endoscopy;  Laterality: N/A;  . ESOPHAGOGASTRODUODENOSCOPY (EGD) WITH PROPOFOL N/A 09/14/2016   Procedure: ESOPHAGOGASTRODUODENOSCOPY (EGD) WITH PROPOFOL;  Surgeon: Manya Silvas, MD;  Location: [facility] ENDOSCOPY;  Service: Endoscopy;  Laterality:  N/A;  . ESOPHAGOGASTRODUODENOSCOPY (EGD) WITH PROPOFOL N/A 11/05/2016   Procedure: ESOPHAGOGASTRODUODENOSCOPY (EGD) WITH PROPOFOL;  Surgeon: Manya Silvas, MD;  Location: [facility] ENDOSCOPY;  Service: Endoscopy;  Laterality: N/A;  . ESOPHAGOGASTRODUODENOSCOPY (EGD) WITH PROPOFOL N/A 02/06/2017   Procedure: ESOPHAGOGASTRODUODENOSCOPY (EGD) WITH PROPOFOL;  Surgeon: Manya Silvas, MD;  Location: [facility] ENDOSCOPY;  Service: Endoscopy;  Laterality: N/A;  . ESOPHAGOGASTRODUODENOSCOPY (EGD) WITH PROPOFOL N/A 04/24/2017   Procedure: ESOPHAGOGASTRODUODENOSCOPY (EGD) WITH PROPOFOL;  Surgeon: Manya Silvas, MD;  Location: [facility] ENDOSCOPY;  Service: Endoscopy;  Laterality: N/A;  . ESOPHAGOGASTRODUODENOSCOPY (EGD)  WITH PROPOFOL N/A 07/31/2017   Procedure: ESOPHAGOGASTRODUODENOSCOPY (EGD) WITH PROPOFOL;  Surgeon: Manya Silvas, MD;  Location: [facility] ENDOSCOPY;  Service: Endoscopy;  Laterality: N/A;  . ESOPHAGOGASTRODUODENOSCOPY (EGD) WITH PROPOFOL N/A 08/08/2018   Procedure: ESOPHAGOGASTRODUODENOSCOPY (EGD) WITH PROPOFOL;  Surgeon: Manya Silvas, MD;  Location: [facility] ENDOSCOPY;  Service: Endoscopy;  Laterality: N/A;  . GIVENS CAPSULE STUDY N/A 12/05/2017   Procedure: GIVENS CAPSULE STUDY;  Surgeon: Manya Silvas, MD;  Location: [facility] ENDOSCOPY;  Service: Endoscopy;  Laterality: N/A;  . TONSILLECTOMY    . TONSILLECTOMY AND ADENOIDECTOMY    . ULNAR NERVE TRANSPOSITION    . UVULOPALATOPHARYNGOPLASTY        Current Facility-Administered Medications:  .  0.9 %  sodium chloride infusion, 250 mL, Intravenous, PRN, Judd Gaudier V, MD .  0.9 %  sodium chloride infusion, , Intravenous, Continuous, Athena Masse, MD, Last Rate: 75 mL/hr at 04/23/20 2152, New Bag at 04/23/20 2152 .  acetaminophen (TYLENOL) tablet 650 mg, 650 mg, Oral, Q4H PRN, Athena Masse, MD, 650 mg at 04/24/20 0256 .  amLODipine (NORVASC) tablet 5 mg, 5 mg, Oral, Daily, Jennye Boroughs, MD .  atorvastatin (LIPITOR) tablet 20 mg, 20 mg, Oral, Daily, Jennye Boroughs, MD .  Derrill Memo ON 04/25/2020] cefTRIAXone (ROCEPHIN) 2 g in sodium chloride 0.9 % 100 mL IVPB, 2 g, Intravenous, Q24H, Hall, Scott A, RPH .  enoxaparin (LOVENOX) injection 40 mg, 40 mg, Subcutaneous, Q12H, Shanlever, Charles M, RPH .  furosemide (LASIX) 250 mg in dextrose 5 % 250 mL (1 mg/mL) infusion, 10 mg/hr, Intravenous, Continuous, Judd Gaudier V, MD, Last Rate: 10 mL/hr at 04/23/20 2342, 10 mg/hr at 04/23/20 2342 .  hydrALAZINE (APRESOLINE) tablet 25 mg, 25 mg, Oral, BID, Jennye Boroughs, MD .  insulin aspart (novoLOG) injection 0-20 Units, 0-20 Units, Subcutaneous, TID WC, Damita Dunnings, Hazel V, MD .  insulin aspart (novoLOG) injection 0-5 Units, 0-5 Units, Subcutaneous, QHS,  Duncan, Waldemar Dickens, MD .  Ipratropium-Albuterol (COMBIVENT) respimat 1 puff, 1 puff, Inhalation, Q6H, Jennye Boroughs, MD .  Iron TABS 325 mg, 1 tablet, Oral, Daily, Jennye Boroughs, MD .  lactulose (Indios) 10 GM/15ML solution 60 g, 60 g, Oral, Daily, Jennye Boroughs, MD .  LORazepam (ATIVAN) tablet 0.5 mg, 0.5 mg, Oral, TID PRN, Jennye Boroughs, MD .  Derrill Memo ON 04/25/2020] losartan (COZAAR) tablet 25 mg, 25 mg, Oral, Daily, Jennye Boroughs, MD .  ondansetron [facility]) injection 4 mg, 4 mg, Intravenous, Q6H PRN, Judd Gaudier V, MD .  oxyCODONE (Oxy IR/ROXICODONE) immediate release tablet 5 mg, 5 mg, Oral, Q8H PRN, Jennye Boroughs, MD .  pantoprazole (PROTONIX) EC tablet 40 mg, 40 mg, Oral, BID, Jennye Boroughs, MD .  potassium chloride (KLOR-CON) CR tablet 40 mEq, 40 mEq, Oral, Daily, Jennye Boroughs, MD .  rifaximin Doreene Nest) tablet 550 mg, 550 mg, Oral, BID, Jennye Boroughs, MD .  sodium chloride (OCEAN) 0.65 % nasal spray 1 spray, 1  spray, Each Nare, PRN, Jennye Boroughs, MD .  sodium chloride flush (NS) 0.9 % injection 3 mL, 3 mL, Intravenous, Q12H, Athena Masse, MD, 3 mL at 04/23/20 2154 .  sodium chloride flush (NS) 0.9 % injection 3 mL, 3 mL, Intravenous, PRN, Athena Masse, MD .  spironolactone (ALDACTONE) tablet 25 mg, 25 mg, Oral, Daily, Jennye Boroughs, MD . amLODipine  5 mg Oral Daily  . atorvastatin  20 mg Oral Daily  . enoxaparin (LOVENOX) injection  40 mg Subcutaneous Q12H  . hydrALAZINE  25 mg Oral BID  . insulin aspart  0-20 Units Subcutaneous TID WC  . insulin aspart  0-5 Units Subcutaneous QHS  . Ipratropium-Albuterol  1 puff Inhalation Q6H  . Iron  1 tablet Oral Daily  . lactulose  60 g Oral Daily  . [START ON 04/25/2020] losartan  25 mg Oral Daily  . pantoprazole  40 mg Oral BID  . potassium chloride  40 mEq Oral Daily  . rifaximin  550 mg Oral BID  . sodium chloride flush  3 mL Intravenous Q12H  . spironolactone  25 mg Oral Daily   . sodium chloride    . sodium chloride  75 mL/hr at 04/23/20 2152  . [START ON 04/25/2020] cefTRIAXone (ROCEPHIN)  IV    . furosemide (LASIX) infusion 10 mg/hr (04/23/20 2342)    Physical Exam: Blood pressure (!) 136/65, pulse 63, temperature 97.9 F (36.6 C), temperature source Oral, resp. rate 19, height 5\' 10"  (1.778 m), weight (!) 137.9 kg, SpO2 99 %.    Affect appropriate Chronically ill obese male  HEENT: normal Neck supple with no adenopathy JVP not visible  Lungs clear with no wheezing and good diaphragmatic motion Heart:  S1/S2 no murmur, no rub, gallop or click Anasarca with tense abdomen/thighs and LE including scrotal edema Neuro non-focal Skin warm and dry No muscular weakness   Labs:   Lab Results  Component Value Date   WBC 9.6 04/23/2020   HGB 7.9 (L) 04/23/2020   HCT 24.5 (L) 04/23/2020   MCV 94.2 04/23/2020   PLT 123 (L) 04/23/2020    Recent Labs  Lab 04/23/20 1506 04/23/20 1506 04/24/20 0607  NA 141   < > 138  K 3.5   < > 3.3*  CL 104   < > 104  CO2 24   < > 25  BUN 63*   < > 64*  CREATININE 2.85*   < > 3.03*  CALCIUM 9.0   < > 8.2*  PROT 7.7  --   --   BILITOT 1.0  --   --   ALKPHOS 91  --   --   ALT 19  --   --   AST 32  --   --   GLUCOSE 121*   < > 154*   < > = values in this interval not displayed.   Lab Results  Component Value Date   CKTOTAL 138 10/15/2018   CKMB 2.3 08/13/2014   TROPONINI <0.03 06/18/2018    Lab Results  Component Value Date   CHOL 141 12/15/2019   CHOL 179 01/15/2019   Lab Results  Component Value Date   HDL 37 (L) 12/15/2019   HDL 41 01/15/2019   Lab Results  Component Value Date   LDLCALC 93 12/15/2019   LDLCALC 124 (H) 01/15/2019   Lab Results  Component Value Date   TRIG 56 12/15/2019   TRIG 72 01/15/2019   Lab Results  Component Value  Date   CHOLHDL 3.8 12/15/2019   CHOLHDL 4.4 01/15/2019   No results found for: LDLDIRECT    Radiology: CT ABDOMEN PELVIS WO CONTRAST  Result Date: 04/23/2020 CLINICAL DATA:  69 year old  male with history of abdominal pain and fever. EXAM: CT ABDOMEN AND PELVIS WITHOUT CONTRAST TECHNIQUE: Multidetector CT imaging of the abdomen and pelvis was performed following the standard protocol without IV contrast. COMPARISON:  No priors. FINDINGS: Lower chest: Trace left pleural effusion lying dependently. Diffuse low attenuation throughout the intravascular compartment, suggestive of anemia. Atherosclerotic calcifications in the left anterior descending, left circumflex and right coronary arteries. Calcifications of the aortic valve. Hepatobiliary: Liver has a shrunken appearance and nodular contour, indicative of underlying cirrhosis. Calcified granulomas are noted in the liver. No discrete cystic or solid hepatic lesions are other identified on today's noncontrast CT examination. Calcified gallstones are noted lying dependently in the gallbladder measuring up to 9 mm. Gallbladder is not distended. Pancreas: No definite pancreatic mass or peripancreatic fluid collections or inflammatory changes are noted on today's noncontrast CT examination. Spleen: Spleen is mildly enlarged measuring 16.7 x 7.1 x 16.9 cm (estimated splenic volume of 1,002 mL) . Adrenals/Urinary Tract: There are no abnormal calcifications within the collecting system of either kidney, along the course of either ureter, or within the lumen of the urinary bladder. No hydroureteronephrosis or perinephric stranding to suggest urinary tract obstruction at this time. The unenhanced appearance of the kidneys is unremarkable bilaterally. Unenhanced appearance of the urinary bladder is normal. Bilateral adrenal glands are normal in appearance. Stomach/Bowel: Unenhanced appearance of the stomach is normal. No pathologic dilatation of small bowel or colon. Appendix is not confidently identified may be surgically absent. Vascular/Lymphatic: Aortic atherosclerosis. Numerous portosystemic collateral pathways, most notable for large splenorenal  collateral vessels. No lymphadenopathy noted in the abdomen or pelvis. Reproductive: Prostate gland and seminal vesicles are unremarkable in appearance. Large right-sided hydrocele incompletely imaged. Other: Small umbilical hernia containing only omental fat. Small volume of ascites. No pneumoperitoneum. Musculoskeletal: There are no aggressive appearing lytic or blastic lesions noted in the visualized portions of the skeleton. IMPRESSION: 1. Morphologic changes in the liver compatible with cirrhosis. This is associated with evidence of portal venous hypertension, including splenomegaly and numerous portosystemic collateral vessels. 2. Cholelithiasis without evidence of acute cholecystitis at this time. Small umbilical hernia containing only omental fat large right-sided hydrocele incompletely imaged. 3. Trace left pleural effusion lying dependent 4. Diffuse low attenuation throughout the intravascular compartment, suggesting anemia. 5. Aortic atherosclerosis, in addition to least 3 vessel coronary artery disease. Please note that although the presence of coronary artery calcium documents the presence of coronary artery disease, the severity of this disease and any potential stenosis cannot be assessed on this non-gated CT examination. Assessment for potential risk factor modification, dietary therapy or pharmacologic therapy may be warranted, if clinically indicated. 6. There are calcifications of the aortic valve. Echocardiographic correlation for evaluation of potential valvular dysfunction may be warranted if clinically indicated. 7. Additional incidental findings, as above. Electronically Signed   By: Vinnie Langton M.D.   On: 04/23/2020 18:44   DG Chest Port 1 View  Result Date: 04/23/2020 CLINICAL DATA:  Dyspnea, worsening shortness of breath and productive cough EXAM: PORTABLE CHEST 1 VIEW COMPARISON:  Radiograph 03/10/2020, CT 12/14/2019 FINDINGS: Chronically low lung volumes with some central  vascular congestion similar to the comparison exam. No convincing features of frank edema at this time. No new focal areas of consolidation. No effusion or pneumothorax is  visible. Mild cardiomegaly is similar to prior accounting for differences in technique. Telemetry leads overlie the chest. No acute osseous or soft tissue abnormality. Degenerative changes are present in the imaged spine and shoulders. Dextrocurvature of the thoracic spine similar to prior. IMPRESSION: Chronically low lung volumes with some central vascular congestion. No convincing features of frank edema or other acute cardiopulmonary abnormality. Stable cardiomegaly. Electronically Signed   By: Lovena Le M.D.   On: 04/23/2020 15:18    EKG: SR rate 81 normal no acute changes    ASSESSMENT AND PLAN:   1. Anasarca:  I think this has more to do with CRF/Cirrhosis and obesity than true diastolic CHF. EF was 60-65% on TTE 12/16/19 with normal RV function and only grade one diastolic dysfunction He has AV sclerosis without stenosis  CT scan review shows normal pericardium with no evidence that constriction should be an issue. Will need iv lasix drip follow CR Inpatient renal consult as he may need dialysis Will repeat echo Once he has diuresed for about a week may be beneficial to do right heart cath prior to d/c to see what his hemodynamics are like I suspect intravascular filling pressures will be quite low as most of this volume accumulation is likely from cirrhosis and CRF Would not put on heparin for mildly elevated troponin  578 as he is not having SSCP, ECG is non acute and he has low PLT count with likely some synthetic dysfunction from his renal/liver failure   Jenkins Rouge MD [facility]  Signed: Jenkins Rouge 04/24/2020, 10:07 AM

## 2020-04-25 DIAGNOSIS — I872 Venous insufficiency (chronic) (peripheral): Secondary | ICD-10-CM

## 2020-04-25 DIAGNOSIS — I480 Paroxysmal atrial fibrillation: Secondary | ICD-10-CM

## 2020-04-25 DIAGNOSIS — I5033 Acute on chronic diastolic (congestive) heart failure: Secondary | ICD-10-CM

## 2020-04-25 LAB — ECHOCARDIOGRAM COMPLETE
AR max vel: 1.35 cm2
AV Area VTI: 1.36 cm2
AV Area mean vel: 1.39 cm2
AV Mean grad: 14.3 mmHg
AV Peak grad: 24.1 mmHg
Ao pk vel: 2.46 m/s
Area-P 1/2: 3.27 cm2
Height: 70 in
S' Lateral: 4.09 cm
Weight: 4864 oz

## 2020-04-25 LAB — BASIC METABOLIC PANEL
Anion gap: 11 (ref 5–15)
BUN: 73 mg/dL — ABNORMAL HIGH (ref 8–23)
CO2: 26 mmol/L (ref 22–32)
Calcium: 8.1 mg/dL — ABNORMAL LOW (ref 8.9–10.3)
Chloride: 103 mmol/L (ref 98–111)
Creatinine, Ser: 3.04 mg/dL — ABNORMAL HIGH (ref 0.61–1.24)
GFR calc Af Amer: 23 mL/min — ABNORMAL LOW (ref >60)
GFR calc non Af Amer: 20 mL/min — ABNORMAL LOW (ref >60)
Glucose, Bld: 152 mg/dL — ABNORMAL HIGH (ref 70–99)
Potassium: 3.1 mmol/L — ABNORMAL LOW (ref 3.5–5.1)
Sodium: 140 mmol/L (ref 135–145)

## 2020-04-25 LAB — GLUCOSE, CAPILLARY
Glucose-Capillary: 135 mg/dL — ABNORMAL HIGH (ref 70–99)
Glucose-Capillary: 174 mg/dL — ABNORMAL HIGH (ref 70–99)
Glucose-Capillary: 156 mg/dL — ABNORMAL HIGH (ref 70–99)
Glucose-Capillary: 171 mg/dL — ABNORMAL HIGH (ref 70–99)

## 2020-04-25 MED ORDER — IPRATROPIUM-ALBUTEROL 0.5-2.5 (3) MG/3ML IN SOLN
3.0000 mL | Freq: Four times a day (QID) | RESPIRATORY_TRACT | Status: DC | PRN
  Administered 2020-04-30: 3 mL via RESPIRATORY_TRACT
  Filled 2020-04-25: qty 3

## 2020-04-25 MED ORDER — POTASSIUM CHLORIDE CRYS ER 20 MEQ PO TBCR
40.0000 meq | EXTENDED_RELEASE_TABLET | ORAL | Status: AC
  Administered 2020-04-25 (×2): 40 meq via ORAL
  Filled 2020-04-25: qty 2

## 2020-04-25 MED ORDER — POTASSIUM CHLORIDE CRYS ER 20 MEQ PO TBCR
40.0000 meq | EXTENDED_RELEASE_TABLET | Freq: Every day | ORAL | Status: DC
  Administered 2020-04-26 – 2020-05-03 (×8): 40 meq via ORAL
  Filled 2020-04-25 (×7): qty 2

## 2020-04-25 NOTE — Progress Notes (Signed)
[facility], [place] 04/25/20  Subjective:   LOS: 2 07/25 0701 - 07/26 0700 In: 522.7 [I.V.:522.7] Out: 2650 [Urine:2650] Patient known to our practice from outpatient follow-up Admitted from home for worsening shortness of breath, productive cough Worsening lower extremity edema Has increasing redness in the left leg and left thigh Worried about renal function  Objective:  Vital signs in last 24 hours:  Temp:  [97.8 F (36.6 C)-98.5 F (36.9 C)] 98 F (36.7 C) (07/26 0817) Pulse Rate:  [64-74] 74 (07/26 0817) Resp:  [18] 18 (07/26 0817) BP: (122-129)/(46-59) 129/59 (07/26 0817) SpO2:  [95 %-100 %] 100 % (07/26 0817)  Weight change:  Filed Weights   04/23/20 1440 04/23/20 2132 04/24/20 0308  Weight: (!) 131.5 kg (!) 138.1 kg (!) 137.9 kg    Intake/Output:    Intake/Output Summary (Last 24 hours) at 04/25/2020 1044 Last data filed at 04/25/2020 1025 Gross per 24 hour  Intake 762.74 ml  Output 1950 ml  Net -1187.26 ml     Physical Exam: General:  Obese gentleman, laying in the bed  HEENT  moist oral mucous membranes  Pulm/lungs  normal breathing effort, decreased breath sounds at bases  CVS/Heart  no rub  Abdomen:   Soft, obese, nontender  Extremities:  2-3+ pitting edema, legs wrapped bilaterally  Neurologic:  Alert, able to answer questions appropriately  Skin:  Left leg and thigh dependent redness          Basic Metabolic Panel:  Recent Labs  Lab 04/23/20 1506 04/24/20 0607 04/25/20 0503  NA 141 138 140  K 3.5 3.3* 3.1*  CL 104 104 103  CO2 24 25 26   GLUCOSE 121* 154* 152*  BUN 63* 64* 73*  CREATININE 2.85* 3.03* 3.04*  CALCIUM 9.0 8.2* 8.1*     CBC: Recent Labs  Lab 04/23/20 1506  WBC 9.6  NEUTROABS 8.8*  HGB 7.9*  HCT 24.5*  MCV 94.2  PLT 123*      Lab Results  Component Value Date   HEPBSAG NON REACTIVE 01/30/2020   HEPBSAB NON REACTIVE 01/30/2020   HEPBIGM NON REACTIVE 01/30/2020       Microbiology:  Recent Results (from the past 240 hour(s))  Blood Culture (routine x 2)     Status: None (Preliminary result)   Collection Time: 04/23/20  3:06 PM   Specimen: BLOOD  Result Value Ref Range Status   Specimen Description BLOOD RAC  Final   Special Requests   Final    BOTTLES DRAWN AEROBIC AND ANAEROBIC Blood Culture results may not be optimal due to an inadequate volume of blood received in culture bottles   Culture   Final    NO GROWTH 2 DAYS Performed at [facility], [address] [place], [place] [address]    Report Status PENDING  Incomplete  Blood Culture (routine x 2)     Status: Abnormal (Preliminary result)   Collection Time: 04/23/20  3:06 PM   Specimen: BLOOD RIGHT FOREARM  Result Value Ref Range Status   Specimen Description   Final    BLOOD RIGHT FOREARM Performed at [facility], [place] [address] [place], [place] [address]    Special Requests   Final    BOTTLES DRAWN AEROBIC AND ANAEROBIC Blood Culture adequate volume Performed at [address] [place], [place] [address]    Culture  Setup Time   Final    GRAM NEGATIVE RODS IN BOTH AEROBIC AND ANAEROBIC  BOTTLES CRITICAL RESULT CALLED TO, READ BACK BY AND VERIFIED WITH: SCOTT HALL 04/24/20 AT 0618 HS    Culture (A)  Final    KLEBSIELLA PNEUMONIAE SUSCEPTIBILITIES TO FOLLOW Performed at [facility], [place] [address] [place], [place] [address]    Report Status PENDING  Incomplete  Blood Culture ID Panel (Reflexed)     Status: Abnormal   Collection Time: 04/23/20  3:06 PM  Result Value Ref Range Status   Enterococcus species NOT DETECTED NOT DETECTED Final   Listeria monocytogenes NOT DETECTED NOT DETECTED Final   Staphylococcus species NOT DETECTED NOT DETECTED Final   Staphylococcus aureus (BCID) NOT DETECTED NOT DETECTED Final   Streptococcus species NOT DETECTED NOT DETECTED Final   Streptococcus agalactiae NOT DETECTED NOT DETECTED Final    Streptococcus pneumoniae NOT DETECTED NOT DETECTED Final   Streptococcus pyogenes NOT DETECTED NOT DETECTED Final   Acinetobacter baumannii NOT DETECTED NOT DETECTED Final   Enterobacteriaceae species DETECTED (A) NOT DETECTED Final    Comment: Enterobacteriaceae represent a large family of gram-negative bacteria, not a single organism. CRITICAL RESULT CALLED TO, READ BACK BY AND VERIFIED WITH: SCOTT HALL 04/24/20 AT 0618 HS    Enterobacter cloacae complex NOT DETECTED NOT DETECTED Final   Escherichia coli NOT DETECTED NOT DETECTED Final   Klebsiella oxytoca NOT DETECTED NOT DETECTED Final   Klebsiella pneumoniae DETECTED (A) NOT DETECTED Final    Comment: CRITICAL RESULT CALLED TO, READ BACK BY AND VERIFIED WITH: SCOTT HALL 04/24/20 AT 0618 HS    Proteus species NOT DETECTED NOT DETECTED Final   Serratia marcescens NOT DETECTED NOT DETECTED Final   Carbapenem resistance NOT DETECTED NOT DETECTED Final   Haemophilus influenzae NOT DETECTED NOT DETECTED Final   Neisseria meningitidis NOT DETECTED NOT DETECTED Final   Pseudomonas aeruginosa NOT DETECTED NOT DETECTED Final   Candida albicans NOT DETECTED NOT DETECTED Final   Candida glabrata NOT DETECTED NOT DETECTED Final   Candida krusei NOT DETECTED NOT DETECTED Final   Candida parapsilosis NOT DETECTED NOT DETECTED Final   Candida tropicalis NOT DETECTED NOT DETECTED Final    Comment: Performed at [facility], [address] [place], [place] [address]  SARS Coronavirus 2 by RT PCR (hospital order, performed in [place] hospital lab)     Status: None   Collection Time: 04/23/20  3:46 PM  Result Value Ref Range Status   SARS Coronavirus 2 NEGATIVE NEGATIVE Final    Comment: (NOTE) SARS-CoV-2 target nucleic acids are NOT DETECTED.  The SARS-CoV-2 RNA is generally detectable in upper and lower respiratory specimens during the acute phase of infection. The lowest concentration of SARS-CoV-2 viral copies this assay can  detect is 250 copies / mL. A negative result does not preclude SARS-CoV-2 infection and should not be used as the sole basis for treatment or other patient management decisions.  A negative result may occur with improper specimen collection / handling, submission of specimen other than nasopharyngeal swab, presence of viral mutation(s) within the areas targeted by this assay, and inadequate number of viral copies (<250 copies / mL). A negative result must be combined with clinical observations, patient history, and epidemiological information.  Fact Sheet for Patients:   StrictlyIdeas.no  Fact Sheet for Healthcare Providers: BankingDealers.co.za  This test is not yet approved or  cleared by the [place] FDA and has been authorized for detection and/or diagnosis of SARS-CoV-2 by FDA under an Emergency Use Authorization (EUA).  This EUA will remain in  effect (meaning this test can be used) for the duration of the COVID-19 declaration under Section 564(b)(1) of the Act, 21 U.S.C. section 360bbb-3(b)(1), unless the authorization is terminated or revoked sooner.  Performed at [facility], [address] [place], [place] [address]     Coagulation Studies: Recent Labs    04/23/20 1506  LABPROT 15.4*  INR 1.3*    Urinalysis: No results for input(s): COLORURINE, LABSPEC, PHURINE, GLUCOSEU, HGBUR, BILIRUBINUR, KETONESUR, PROTEINUR, UROBILINOGEN, NITRITE, LEUKOCYTESUR in the last 72 hours.  Invalid input(s): APPERANCEUR    Imaging: CT ABDOMEN PELVIS WO CONTRAST  Result Date: 04/23/2020 CLINICAL DATA:  69 year old male with history of abdominal pain and fever. EXAM: CT ABDOMEN AND PELVIS WITHOUT CONTRAST TECHNIQUE: Multidetector CT imaging of the abdomen and pelvis was performed following the standard protocol without IV contrast. COMPARISON:  No priors. FINDINGS: Lower chest: Trace left pleural effusion lying  dependently. Diffuse low attenuation throughout the intravascular compartment, suggestive of anemia. Atherosclerotic calcifications in the left anterior descending, left circumflex and right coronary arteries. Calcifications of the aortic valve. Hepatobiliary: Liver has a shrunken appearance and nodular contour, indicative of underlying cirrhosis. Calcified granulomas are noted in the liver. No discrete cystic or solid hepatic lesions are other identified on today's noncontrast CT examination. Calcified gallstones are noted lying dependently in the gallbladder measuring up to 9 mm. Gallbladder is not distended. Pancreas: No definite pancreatic mass or peripancreatic fluid collections or inflammatory changes are noted on today's noncontrast CT examination. Spleen: Spleen is mildly enlarged measuring 16.7 x 7.1 x 16.9 cm (estimated splenic volume of 1,002 mL) . Adrenals/Urinary Tract: There are no abnormal calcifications within the collecting system of either kidney, along the course of either ureter, or within the lumen of the urinary bladder. No hydroureteronephrosis or perinephric stranding to suggest urinary tract obstruction at this time. The unenhanced appearance of the kidneys is unremarkable bilaterally. Unenhanced appearance of the urinary bladder is normal. Bilateral adrenal glands are normal in appearance. Stomach/Bowel: Unenhanced appearance of the stomach is normal. No pathologic dilatation of small bowel or colon. Appendix is not confidently identified may be surgically absent. Vascular/Lymphatic: Aortic atherosclerosis. Numerous portosystemic collateral pathways, most notable for large splenorenal collateral vessels. No lymphadenopathy noted in the abdomen or pelvis. Reproductive: Prostate gland and seminal vesicles are unremarkable in appearance. Large right-sided hydrocele incompletely imaged. Other: Small umbilical hernia containing only omental fat. Small volume of ascites. No pneumoperitoneum.  Musculoskeletal: There are no aggressive appearing lytic or blastic lesions noted in the visualized portions of the skeleton. IMPRESSION: 1. Morphologic changes in the liver compatible with cirrhosis. This is associated with evidence of portal venous hypertension, including splenomegaly and numerous portosystemic collateral vessels. 2. Cholelithiasis without evidence of acute cholecystitis at this time. Small umbilical hernia containing only omental fat large right-sided hydrocele incompletely imaged. 3. Trace left pleural effusion lying dependent 4. Diffuse low attenuation throughout the intravascular compartment, suggesting anemia. 5. Aortic atherosclerosis, in addition to least 3 vessel coronary artery disease. Please note that although the presence of coronary artery calcium documents the presence of coronary artery disease, the severity of this disease and any potential stenosis cannot be assessed on this non-gated CT examination. Assessment for potential risk factor modification, dietary therapy or pharmacologic therapy may be warranted, if clinically indicated. 6. There are calcifications of the aortic valve. Echocardiographic correlation for evaluation of potential valvular dysfunction may be warranted if clinically indicated. 7. Additional incidental findings, as above. Electronically Signed   By: Mauri Brooklyn.D.  On: 04/23/2020 18:44   DG Chest Port 1 View  Result Date: 04/23/2020 CLINICAL DATA:  Dyspnea, worsening shortness of breath and productive cough EXAM: PORTABLE CHEST 1 VIEW COMPARISON:  Radiograph 03/10/2020, CT 12/14/2019 FINDINGS: Chronically low lung volumes with some central vascular congestion similar to the comparison exam. No convincing features of frank edema at this time. No new focal areas of consolidation. No effusion or pneumothorax is visible. Mild cardiomegaly is similar to prior accounting for differences in technique. Telemetry leads overlie the chest. No acute osseous  or soft tissue abnormality. Degenerative changes are present in the imaged spine and shoulders. Dextrocurvature of the thoracic spine similar to prior. IMPRESSION: Chronically low lung volumes with some central vascular congestion. No convincing features of frank edema or other acute cardiopulmonary abnormality. Stable cardiomegaly. Electronically Signed   By: Lovena Le M.D.   On: 04/23/2020 15:18   ECHOCARDIOGRAM COMPLETE  Result Date: 04/25/2020    ECHOCARDIOGRAM REPORT   Patient Name:   [name] Date of Exam: 04/24/2020 Medical Rec #:  [id number]    Height:       70.0 in Accession #:    [id number]   Weight:       304.0 lb Date of Birth:  Oct 25, 1950     BSA:          2.494 m Patient Age:    69 years     BP:           136/65 mmHg Patient Gender: M            HR:           63 bpm. Exam Location:  [facility] Procedure: 2D Echo, Cardiac Doppler and Color Doppler Indications:     CHF-Acute Diastolic 875.64 / P32.95  History:         Patient has prior history of Echocardiogram examinations. CHF;                  Risk Factors:Hypertension.  Sonographer:     Alyse Low Roar Referring Phys:  [id number] Athena Masse Diagnosing Phys: Kathlyn Sacramento MD IMPRESSIONS  1. Left ventricular ejection fraction, by estimation, is 50 to 55%. The left ventricle has low normal function. The left ventricle has no regional wall motion abnormalities. The left ventricular internal cavity size was mildly dilated. There is mild left ventricular hypertrophy. Left ventricular diastolic parameters are consistent with Grade II diastolic dysfunction (pseudonormalization).  2. Right ventricular systolic function is normal. The right ventricular size is normal. There is mildly elevated pulmonary artery systolic pressure.  3. Left atrial size was moderately dilated.  4. Right atrial size was mildly dilated.  5. The mitral valve is normal in structure. Trivial mitral valve regurgitation. No evidence of mitral stenosis.  6. The aortic valve is abnormal.  Aortic valve regurgitation is not visualized. Mild to moderate aortic valve stenosis. Aortic valve area, by VTI measures 1.36 cm. Aortic valve mean gradient measures 14.3 mmHg.  7. The inferior vena cava is dilated in size with <50% respiratory variability, suggesting right atrial pressure of 15 mmHg. FINDINGS  Left Ventricle: Left ventricular ejection fraction, by estimation, is 50 to 55%. The left ventricle has low normal function. The left ventricle has no regional wall motion abnormalities. The left ventricular internal cavity size was mildly dilated. There is mild left ventricular hypertrophy. Left ventricular diastolic parameters are consistent with Grade II diastolic dysfunction (pseudonormalization). Right Ventricle: The right ventricular size is normal. No increase in right ventricular  wall thickness. Right ventricular systolic function is normal. There is mildly elevated pulmonary artery systolic pressure. The tricuspid regurgitant velocity is 2.59  m/s, and with an assumed right atrial pressure of 15 mmHg, the estimated right ventricular systolic pressure is 33.2 mmHg. Left Atrium: Left atrial size was moderately dilated. Right Atrium: Right atrial size was mildly dilated. Pericardium: There is no evidence of pericardial effusion. Mitral Valve: The mitral valve is normal in structure. Normal mobility of the mitral valve leaflets. Moderate mitral annular calcification. Trivial mitral valve regurgitation. No evidence of mitral valve stenosis. Tricuspid Valve: The tricuspid valve is normal in structure. Tricuspid valve regurgitation is trivial. No evidence of tricuspid stenosis. Aortic Valve: The aortic valve is abnormal. . There is mild thickening and moderate calcification of the aortic valve. Aortic valve regurgitation is not visualized. Mild to moderate aortic stenosis is present. There is mild thickening of the aortic valve. There is moderate calcification of the aortic valve. Aortic valve mean  gradient measures 14.3 mmHg. Aortic valve peak gradient measures 24.1 mmHg. Aortic valve area, by VTI measures 1.36 cm. Pulmonic Valve: The pulmonic valve was normal in structure. Pulmonic valve regurgitation is not visualized. No evidence of pulmonic stenosis. Aorta: The aortic root is normal in size and structure. Venous: The inferior vena cava is dilated in size with less than 50% respiratory variability, suggesting right atrial pressure of 15 mmHg. IAS/Shunts: No atrial level shunt detected by color flow Doppler.  LEFT VENTRICLE PLAX 2D LVIDd:         5.46 cm  Diastology LVIDs:         4.09 cm  LV e' lateral:   8.27 cm/s LV PW:         1.22 cm  LV E/e' lateral: 17.9 LV IVS:        1.34 cm  LV e' medial:    8.05 cm/s LVOT diam:     1.70 cm  LV E/e' medial:  18.4 LV SV:         76 LV SV Index:   30 LVOT Area:     2.27 cm  RIGHT VENTRICLE RV Mid diam:    3.78 cm RV S prime:     13.70 cm/s TAPSE (M-mode): 2.2 cm LEFT ATRIUM              Index       RIGHT ATRIUM           Index LA diam:        4.60 cm  1.84 cm/m  RA Area:     22.10 cm LA Vol (A2C):   117.0 ml 46.92 ml/m RA Volume:   71.80 ml  28.79 ml/m LA Vol (A4C):   101.0 ml 40.50 ml/m LA Biplane Vol: 112.0 ml 44.91 ml/m  AORTIC VALVE                    PULMONIC VALVE AV Area (Vmax):    1.35 cm     PV Vmax:        1.45 m/s AV Area (Vmean):   1.39 cm     PV Peak grad:   8.4 mmHg AV Area (VTI):     1.36 cm     RVOT Peak grad: 5 mmHg AV Vmax:           245.50 cm/s AV Vmean:          178.333 cm/s AV VTI:  0.555 m AV Peak Grad:      24.1 mmHg AV Mean Grad:      14.3 mmHg LVOT Vmax:         146.00 cm/s LVOT Vmean:        109.000 cm/s LVOT VTI:          0.333 m LVOT/AV VTI ratio: 0.60  AORTA Ao Root diam: 3.00 cm MITRAL VALVE                TRICUSPID VALVE MV Area (PHT): 3.27 cm     TR Peak grad:   26.8 mmHg MV Decel Time: 232 msec     TR Vmax:        259.00 cm/s MV E velocity: 148.00 cm/s MV A velocity: 107.00 cm/s  SHUNTS MV E/A ratio:  1.38          Systemic VTI:  0.33 m MV A Prime:    11.4 cm/s    Systemic Diam: 1.70 cm Kathlyn Sacramento MD Electronically signed by Kathlyn Sacramento MD Signature Date/Time: 04/25/2020/8:22:25 AM    Final      Medications:   . sodium chloride    . cefTRIAXone (ROCEPHIN)  IV 2 g (04/25/20 0952)  . furosemide (LASIX) infusion 10 mg/hr (04/25/20 0147)   . amLODipine  5 mg Oral Daily  . atorvastatin  20 mg Oral Daily  . enoxaparin (LOVENOX) injection  40 mg Subcutaneous Q12H  . ferrous sulfate  325 mg Oral Daily  . hydrALAZINE  25 mg Oral BID  . insulin aspart  0-20 Units Subcutaneous TID WC  . insulin aspart  0-5 Units Subcutaneous QHS  . lactulose  60 g Oral Daily  . losartan  25 mg Oral Daily  . pantoprazole  40 mg Oral BID  . potassium chloride  40 mEq Oral Q4H  . [START ON 04/26/2020] potassium chloride  40 mEq Oral Daily  . rifaximin  550 mg Oral BID  . sodium chloride flush  3 mL Intravenous Q12H  . spironolactone  25 mg Oral Daily   sodium chloride, acetaminophen, ipratropium-albuterol, LORazepam, ondansetron (ZOFRAN) IV, oxyCODONE, sodium chloride, sodium chloride flush  Assessment/ Plan:  69 y.o. male with   Principal Problem:   Sepsis ([place]) Active Problems:   Cirrhosis ([place])   Esophageal varices (HCC)   Chronic anemia   Chronic venous stasis dermatitis of both lower extremities   Obesity, Class III, BMI 40-49.9 (morbid obesity) ([place])   Type 2 diabetes mellitus with diabetic nephropathy, with long-term current use of insulin (HCC)   Acute on chronic diastolic CHF (congestive heart failure) (HCC)   CKD (chronic kidney disease) stage 4, GFR 15-29 ml/min (HCC)   Acute hypoxemic respiratory failure (HCC)   Paroxysmal atrial fibrillation (HCC)   Acute gastroenteritis   COPD with chronic bronchitis ([place])   #.  Acute kidney injury on CKD st 4 Recent Labs    04/23/20 1506 04/24/20 0607 04/25/20 0503  CREATININE 2.85* 3.03* 3.04*  AKI is likely secondary to abnormal cardiorenal  hemodynamics in setting of liver cirrhosis.  Underlying CKD likely secondary to diabetes. CT abdomen without contrast on July 24 confirmed cirrhosis, portal venous hypertension, cholelithiasis, aortic atherosclerosis, three-vessel coronary disease, aortic valve calcifications, no hydronephrosis  #. Anemia of CKD  Lab Results  Component Value Date   HGB 7.9 (L) 04/23/2020  Discuss Epogen with patient    #. Diabetes type 2 with CKD Hemoglobin A1C (%)  Date Value  05/10/2014 11.0 (H)   Hgb  A1c MFr Bld (%)  Date Value  02/23/2020 6.9 (H)   #Cirrhosis with volume overload Currently on IV furosemide infusion. Responding fairly. Continue to monitor renal function closely  #Hypokalemia Due to aggressive diuresis. Replace orally as necessary.    LOS: [place] 7/26/202110:44 AM  [facility] [place], [place]

## 2020-04-25 NOTE — Progress Notes (Signed)
Progress Note  Patient Name: [name] Date of Encounter: 04/25/2020  Primary Cardiologist: End  Subjective   Dyspnea improving. No chest pain. Documented UOP of 2.1 L for the past 24 hours with a net - 1.9 L for the admission.   Inpatient Medications    Scheduled Meds: . amLODipine  5 mg Oral Daily  . atorvastatin  20 mg Oral Daily  . enoxaparin (LOVENOX) injection  40 mg Subcutaneous Q12H  . ferrous sulfate  325 mg Oral Daily  . hydrALAZINE  25 mg Oral BID  . insulin aspart  0-20 Units Subcutaneous TID WC  . insulin aspart  0-5 Units Subcutaneous QHS  . lactulose  60 g Oral Daily  . losartan  25 mg Oral Daily  . pantoprazole  40 mg Oral BID  . potassium chloride  40 mEq Oral Q4H  . [START ON 04/26/2020] potassium chloride  40 mEq Oral Daily  . rifaximin  550 mg Oral BID  . sodium chloride flush  3 mL Intravenous Q12H  . spironolactone  25 mg Oral Daily   Continuous Infusions: . sodium chloride    . cefTRIAXone (ROCEPHIN)  IV 2 g (04/25/20 0952)  . furosemide (LASIX) infusion 10 mg/hr (04/25/20 0147)   PRN Meds: sodium chloride, acetaminophen, ipratropium-albuterol, LORazepam, ondansetron (ZOFRAN) IV, oxyCODONE, sodium chloride, sodium chloride flush   Vital Signs    Vitals:   04/24/20 2014 04/24/20 2057 04/25/20 0412 04/25/20 0817  BP: (!) 129/46  (!) 122/58 (!) 129/59  Pulse: 72  70 74  Resp: 18   18  Temp: 98.5 F (36.9 C)  97.8 F (36.6 C) 98 F (36.7 C)  TempSrc: Oral  Oral   SpO2: 100% 99% 95% 100%  Weight:      Height:        Intake/Output Summary (Last 24 hours) at 04/25/2020 1017 Last data filed at 04/25/2020 0700 Gross per 24 hour  Intake 522.74 ml  Output 1950 ml  Net -1427.26 ml   Filed Weights   04/23/20 1440 04/23/20 2132 04/24/20 0308  Weight: (!) 131.5 kg (!) 138.1 kg (!) 137.9 kg    Telemetry    SR with PACs, rate PVC - Personally Reviewed  ECG    No new tracings - Personally Reviewed  Physical Exam   GEN: No acute  distress.   Neck: JVD difficult to assess secondary to body habitus. Cardiac: RRR, I/VI systolic murmur, no rubs, or gallops.  Respiratory: Diminished breath sounds bilaterally.  GI: Soft, nontender, non-distended.   MS: Lower extremities wrapped with mild erythema noted superiorly; No deformity. Neuro:  Alert and oriented x 3; Nonfocal.  Psych: Normal affect.  Labs    Chemistry Recent Labs  Lab 04/23/20 1506 04/24/20 0607 04/25/20 0503  NA 141 138 140  K 3.5 3.3* 3.1*  CL 104 104 103  CO2 24 25 26   GLUCOSE 121* 154* 152*  BUN 63* 64* 73*  CREATININE 2.85* 3.03* 3.04*  CALCIUM 9.0 8.2* 8.1*  PROT 7.7  --   --   ALBUMIN 3.4*  --   --   AST 32  --   --   ALT 19  --   --   ALKPHOS 91  --   --   BILITOT 1.0  --   --   GFRNONAA 22* 20* 20*  GFRAA 25* 23* 23*  ANIONGAP 13 9 11      Hematology Recent Labs  Lab 04/23/20 1506  WBC 9.6  RBC  2.60*  HGB 7.9*  HCT 24.5*  MCV 94.2  MCH 30.4  MCHC 32.2  RDW 17.4*  PLT 123*    Cardiac EnzymesNo results for input(s): TROPONINI in the last 168 hours. No results for input(s): TROPIPOC in the last 168 hours.   BNP Recent Labs  Lab 04/23/20 1525  BNP 376.4*     DDimer No results for input(s): DDIMER in the last 168 hours.   Radiology    CT ABDOMEN PELVIS WO CONTRAST  Result Date: 04/23/2020 IMPRESSION: 1. Morphologic changes in the liver compatible with cirrhosis. This is associated with evidence of portal venous hypertension, including splenomegaly and numerous portosystemic collateral vessels. 2. Cholelithiasis without evidence of acute cholecystitis at this time. Small umbilical hernia containing only omental fat large right-sided hydrocele incompletely imaged. 3. Trace left pleural effusion lying dependent 4. Diffuse low attenuation throughout the intravascular compartment, suggesting anemia. 5. Aortic atherosclerosis, in addition to least 3 vessel coronary artery disease. Please note that although the presence of  coronary artery calcium documents the presence of coronary artery disease, the severity of this disease and any potential stenosis cannot be assessed on this non-gated CT examination. Assessment for potential risk factor modification, dietary therapy or pharmacologic therapy may be warranted, if clinically indicated. 6. There are calcifications of the aortic valve. Echocardiographic correlation for evaluation of potential valvular dysfunction may be warranted if clinically indicated. 7. Additional incidental findings, as above. Electronically Signed   By: Vinnie Langton M.D.   On: 04/23/2020 18:44   DG Chest Port 1 View  Result Date: 04/23/2020 IMPRESSION: Chronically low lung volumes with some central vascular congestion. No convincing features of frank edema or other acute cardiopulmonary abnormality. Stable cardiomegaly. Electronically Signed   By: Lovena Le M.D.   On: 04/23/2020 15:18    Cardiac Studies   2D echo 04/24/2020: 1. Left ventricular ejection fraction, by estimation, is 50 to 55%. The  left ventricle has low normal function. The left ventricle has no regional  wall motion abnormalities. The left ventricular internal cavity size was  mildly dilated. There is mild  left ventricular hypertrophy. Left ventricular diastolic parameters are  consistent with Grade II diastolic dysfunction (pseudonormalization).  2. Right ventricular systolic function is normal. The right ventricular  size is normal. There is mildly elevated pulmonary artery systolic  pressure.  3. Left atrial size was moderately dilated.  4. Right atrial size was mildly dilated.  5. The mitral valve is normal in structure. Trivial mitral valve  regurgitation. No evidence of mitral stenosis.  6. The aortic valve is abnormal. Aortic valve regurgitation is not  visualized. Mild to moderate aortic valve stenosis. Aortic valve area, by  VTI measures 1.36 cm. Aortic valve mean gradient measures 14.3 mmHg.  7.  The inferior vena cava is dilated in size with <50% respiratory  variability, suggesting right atrial pressure of 15 mmHg.  Patient Profile     69 y.o. 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  Assessment & Plan    1. Liver cirrhosis with anasarca/edema: -Suspected to be primarily in the setting of CKD stage IV with cirrhosis, obesity, and lymphedema as well as a component of HFpEF with echo showing only Gr2DD and mild to moderate aortic stenosis -Lasix gtt -Spironolactone  -May need RHC to further assess his hemodynamics prior  to discharge  2. Elevated HS-Tn: -Mildly elevated and likely in the setting of supply demand ischemia in the setting of the above -Echo with low normal LVSF -No indication for heparin gtt at this time  3. HFpEF: -He reports feeling his best with a weight around 270 pounds with a current weight of 304 pounds -Lasix gtt -Spironolactone   4. PAF: -Currently in sinus with PACs -Not on AV nodal blocking medications -CHADS2VASc at least 5 (CHF, HTN, age x 1, DM, vascular disease) -Not on [place] in the setting of portal hypertension and anemia  6. Sepsis with Klensiella PNA and bacteremia: -Management per IM  5. Chronic anemia: -Last CBC with improved HGB of 7.9  6. HTN: -Well controlled -With lymphedema, consider changing amlodipine to alternative antihypertensive   7. Acute on CKD stage IV: -Monitor  8. Hypokalemia: -Spironolactone as above  For questions or updates, please contact [place] Please consult [email or web address] for contact info under Cardiology/STEMI.    Signed, Christell Faith, PA-C [place] Pager: [phone] 04/25/2020, 10:17 AM

## 2020-04-25 NOTE — Progress Notes (Addendum)
Progress Note    Corey West  UEA:[id number] DOB: 06-29-1951  DOA: 04/23/2020 PCP: Leonel Ramsay, MD      Brief Narrative:    Medical records reviewed and are as summarized below:  Corey West is a 69 y.o. 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  He has also been having diarrhea this because he takes lactulose for his "ammonia".  He was febrile (101.8), tachycardic lactic acid was elevated at 3.0.  He was admitted to the hospital for sepsis and acute on chronic diastolic CHF with fluid overload.       Assessment/Plan:   Principal Problem:   Sepsis ([place]) Active Problems:   Cirrhosis ([place])   Esophageal varices (HCC)   Chronic anemia   Chronic venous stasis dermatitis of both lower extremities   Obesity, Class III, BMI 40-49.9 (morbid obesity) (HCC)   Type 2 diabetes mellitus with diabetic nephropathy, with long-term current use of insulin (HCC)   Acute on chronic diastolic CHF (congestive heart failure) (HCC)   CKD (chronic kidney disease) stage 4, GFR 15-29 ml/min (HCC)   Acute hypoxemic respiratory failure (HCC)   Paroxysmal atrial fibrillation (HCC)   COPD with chronic bronchitis (HCC)    Sepsis: Klebsiella pneumonia bacteremia: Continue empiric IV Rocephin.  Follow-up blood culture sensitivity report.  Acute on chronic diastolic CHF/anasarca: Continue IV Lasix infusion.  Monitor BMP, daily weights and urine output.  2D echo on 04/23/2020 showed EF estimated at 50 to 55%, mild LVH,  grade 2 diastolic dysfunction.    Liver cirrhosis with anasarca, history of esophageal varices: Continue diuretics, rifaximin and lactulose.  Elevated troponin: This is likely due to demand ischemia.  Patient has been evaluated by cardiologist.  AKI on CKD stage IV: Monitor BMP.  Consulted nephrologist, Dr. Candiss Norse, per patient's request  Insulin-dependent diabetes mellitus, episode of hypoglycemia on 04/23/2020: NovoLog as needed for hyperglycemia  Hypertension: Continue antihypertensives  Paroxysmal atrial fibrillation: He is not on full dose anticoagulation because of liver disease, kidney disease, thrombocytopenia and chronic anemia.  Chronic venous stasis of bilateral lower extremities: Continue leg wraps   Consult palliative care team because of multiple comorbidities   Body mass index is 43.62 kg/m.  (Morbid obesity): This complicates overall care and prognosis  Diet Order            Diet heart healthy/carb modified Room service appropriate? Yes; Fluid consistency: Thin  Diet effective now                       Medications:   . amLODipine  5 mg Oral Daily  . atorvastatin  20 mg Oral Daily  . enoxaparin (LOVENOX) injection  40 mg Subcutaneous Q12H  . ferrous sulfate  325 mg Oral Daily  . hydrALAZINE  25 mg Oral BID  . insulin aspart  0-20 Units Subcutaneous TID WC  . insulin aspart  0-5 Units Subcutaneous QHS  . lactulose  60 g Oral Daily  . losartan  25 mg Oral Daily  . pantoprazole  40 mg Oral BID  . potassium chloride  40  mEq Oral Q4H  . [START ON 04/26/2020] potassium chloride  40 mEq Oral Daily  . rifaximin  550 mg Oral BID  . sodium chloride flush  3 mL Intravenous Q12H  . spironolactone  25 mg Oral Daily   Continuous Infusions: . sodium chloride    . cefTRIAXone (ROCEPHIN)  IV 2 g (04/25/20 0952)  . furosemide (LASIX) infusion 10 mg/hr (04/25/20 0147)     Anti-infectives (From admission, onward)   Start     Dose/Rate Route Frequency Ordered Stop    04/25/20 0900  cefTRIAXone (ROCEPHIN) 2 g in sodium chloride 0.9 % 100 mL IVPB     Discontinue     2 g 200 mL/hr over 30 Minutes Intravenous Every 24 hours 04/24/20 0651     04/24/20 2200  vancomycin (VANCOREADY) IVPB 1500 mg/300 mL  Status:  Discontinued        1,500 mg 150 mL/hr over 120 Minutes Intravenous Every 24 hours 04/23/20 1952 04/24/20 0651   04/24/20 1100  rifaximin (XIFAXAN) tablet 550 mg     Discontinue     550 mg Oral 2 times daily 04/24/20 0955     04/23/20 2200  ceFEPIme (MAXIPIME) 2 g in sodium chloride 0.9 % 100 mL IVPB  Status:  Discontinued        2 g 200 mL/hr over 30 Minutes Intravenous Every 12 hours 04/23/20 1952 04/24/20 0650   04/23/20 1945  metroNIDAZOLE (FLAGYL) IVPB 500 mg  Status:  Discontinued        500 mg 100 mL/hr over 60 Minutes Intravenous Every 8 hours 04/23/20 1936 04/24/20 0649   04/23/20 1600  vancomycin (VANCOREADY) IVPB 1500 mg/300 mL        1,500 mg 150 mL/hr over 120 Minutes Intravenous  Once 04/23/20 1459 04/23/20 2154   04/23/20 1500  ceFEPIme (MAXIPIME) 2 g in sodium chloride 0.9 % 100 mL IVPB        2 g 200 mL/hr over 30 Minutes Intravenous  Once 04/23/20 1452 04/23/20 1640   04/23/20 1500  metroNIDAZOLE (FLAGYL) IVPB 500 mg        500 mg 100 mL/hr over 60 Minutes Intravenous  Once 04/23/20 1452 04/23/20 1640   04/23/20 1500  vancomycin (VANCOCIN) IVPB 1000 mg/200 mL premix        1,000 mg 200 mL/hr over 60 Minutes Intravenous  Once 04/23/20 1452 04/23/20 1805             Family Communication/Anticipated D/C date and plan/Code Status   DVT prophylaxis: enoxaparin (LOVENOX) injection 40 mg Start: 04/24/20 1000     Code Status: Full Code  Family Communication: Plan discussed with patient Disposition Plan:    Status is: Inpatient  Remains inpatient appropriate because:IV treatments appropriate due to intensity of illness or inability to take PO and Inpatient level of care appropriate due to severity of  illness   Dispo: The patient is from: Home              Anticipated d/c is to: Home              Anticipated d/c date is: 3 days              Patient currently is not medically stable to d/c.           Subjective:   He said he has not received lactulose since admission.  He requested to have his lactulose earlier today.  It appears he refused to take lactulose yesterday.  He does not remember why.  Abdominal pain is a little better today but is still requesting pain medicine.  He requested to see his nephrologist today.  Objective:    Vitals:   04/24/20 2057 04/25/20 0412 04/25/20 0817 04/25/20 1205  BP:  (!) 122/58 (!) 129/59 (!) 123/48  Pulse:  70 74 76  Resp:   18 18  Temp:  97.8 F (36.6 C) 98 F (36.7 C) 98.2 F (36.8 C)  TempSrc:  Oral Oral Oral  SpO2: 99% 95% 100% 97%  Weight:      Height:       No data found.   Intake/Output Summary (Last 24 hours) at 04/25/2020 1613 Last data filed at 04/25/2020 1452 Gross per 24 hour  Intake 764.85 ml  Output 2800 ml  Net -2035.15 ml   Filed Weights   04/23/20 1440 04/23/20 2132 04/24/20 0308  Weight: (!) 131.5 kg (!) 138.1 kg (!) 137.9 kg    Exam:  GEN: No acute distress SKIN: No rash EYES: EOMI ENT: MMM CV: RRR PULM: No wheezing or rales heard ABD: soft, obese and distended, lower abdominal tenderness (less tender) but no rebound tenderness or guarding, +BS CNS: AAO x 3, non focal EXT: Bilateral lower extremity edema up to thighs.  Erythema on bilateral legs and erythema is more pronounced on the left leg.  Tenderness of bilateral legs.   Data Reviewed:   I have personally reviewed following labs and imaging studies:  Labs: Labs show the following:   Basic Metabolic Panel: Recent Labs  Lab 04/23/20 1506 04/23/20 1506 04/24/20 0607 04/25/20 0503  NA 141  --  138 140  K 3.5   < > 3.3* 3.1*  CL 104  --  104 103  CO2 24  --  25 26  GLUCOSE 121*  --  154* 152*  BUN 63*  --  64* 73*  CREATININE  2.85*  --  3.03* 3.04*  CALCIUM 9.0  --  8.2* 8.1*   < > = values in this interval not displayed.   GFR Estimated Creatinine Clearance: 32.6 mL/min (A) (by C-G formula based on SCr of 3.04 mg/dL (H)). Liver Function Tests: Recent Labs  Lab 04/23/20 1506  AST 32  ALT 19  ALKPHOS 91  BILITOT 1.0  PROT 7.7  ALBUMIN 3.4*   No results for input(s): LIPASE, AMYLASE in the last 168 hours. No results for input(s): AMMONIA in the last 168 hours. Coagulation profile Recent Labs  Lab 04/23/20 1506  INR 1.3*    CBC: Recent Labs  Lab 04/23/20 1506  WBC 9.6  NEUTROABS 8.8*  HGB 7.9*  HCT 24.5*  MCV 94.2  PLT 123*   Cardiac Enzymes: No results for input(s): CKTOTAL, CKMB, CKMBINDEX, TROPONINI in the last 168 hours. BNP (last 3 results) No results for input(s): PROBNP in the last 8760 hours. CBG: Recent Labs  Lab 04/24/20 1119 04/24/20 1622 04/24/20 2112 04/25/20 0816 04/25/20 1204  GLUCAP 173* 170* 160* 135* 174*   D-Dimer: No results for input(s): DDIMER in the last 72 hours. Hgb A1c: No results for input(s): HGBA1C in the last 72 hours. Lipid Profile: No results for input(s): CHOL, HDL, LDLCALC, TRIG, CHOLHDL, LDLDIRECT in the last 72 hours. Thyroid function studies: No results for input(s): TSH, T4TOTAL, T3FREE, THYROIDAB in the last 72 hours.  Invalid input(s): FREET3 Anemia work up: No results for input(s): VITAMINB12, FOLATE, FERRITIN, TIBC, IRON, RETICCTPCT in the last 72 hours. Sepsis Labs: Recent Labs  Lab 04/23/20  1506 04/23/20 1944 04/23/20 2142  WBC 9.6  --   --   LATICACIDVEN 3.0* 1.2 1.3    Microbiology Recent Results (from the past 240 hour(s))  Blood Culture (routine x 2)     Status: None (Preliminary result)   Collection Time: 04/23/20  3:06 PM   Specimen: BLOOD  Result Value Ref Range Status   Specimen Description BLOOD RAC  Final   Special Requests   Final    BOTTLES DRAWN AEROBIC AND ANAEROBIC Blood Culture results may not be  optimal due to an inadequate volume of blood received in culture bottles   Culture   Final    NO GROWTH 2 DAYS Performed at [facility], [address] [place], [place] [address]    Report Status PENDING  Incomplete  Blood Culture (routine x 2)     Status: Abnormal (Preliminary result)   Collection Time: 04/23/20  3:06 PM   Specimen: BLOOD RIGHT FOREARM  Result Value Ref Range Status   Specimen Description   Final    BLOOD RIGHT FOREARM Performed at [facility], [place] [address], [place], [place] [address]    Special Requests   Final    BOTTLES DRAWN AEROBIC AND ANAEROBIC Blood Culture adequate volume Performed at [facility], [address] [place], [place] [address]    Culture  Setup Time   Final    GRAM NEGATIVE RODS IN BOTH AEROBIC AND ANAEROBIC BOTTLES CRITICAL RESULT CALLED TO, READ BACK BY AND VERIFIED WITH: SCOTT HALL 04/24/20 AT 0618 HS    Culture (A)  Final    KLEBSIELLA PNEUMONIAE SUSCEPTIBILITIES TO FOLLOW Performed at [facility], [place] [address], [place], [place] [address]    Report Status PENDING  Incomplete  Blood Culture ID Panel (Reflexed)     Status: Abnormal   Collection Time: 04/23/20  3:06 PM  Result Value Ref Range Status   Enterococcus species NOT DETECTED NOT DETECTED Final   Listeria monocytogenes NOT DETECTED NOT DETECTED Final   Staphylococcus species NOT DETECTED NOT DETECTED Final   Staphylococcus aureus (BCID) NOT DETECTED NOT DETECTED Final   Streptococcus species NOT DETECTED NOT DETECTED Final   Streptococcus agalactiae NOT DETECTED NOT DETECTED Final   Streptococcus pneumoniae NOT DETECTED NOT DETECTED Final   Streptococcus pyogenes NOT DETECTED NOT DETECTED Final   Acinetobacter baumannii NOT DETECTED NOT DETECTED Final   Enterobacteriaceae species DETECTED (A) NOT DETECTED Final    Comment: Enterobacteriaceae represent a large family of gram-negative bacteria, not a single organism. CRITICAL RESULT  CALLED TO, READ BACK BY AND VERIFIED WITH: SCOTT HALL 04/24/20 AT 0618 HS    Enterobacter cloacae complex NOT DETECTED NOT DETECTED Final   Escherichia coli NOT DETECTED NOT DETECTED Final   Klebsiella oxytoca NOT DETECTED NOT DETECTED Final   Klebsiella pneumoniae DETECTED (A) NOT DETECTED Final    Comment: CRITICAL RESULT CALLED TO, READ BACK BY AND VERIFIED WITH: SCOTT HALL 04/24/20 AT 0618 HS    Proteus species NOT DETECTED NOT DETECTED Final   Serratia marcescens NOT DETECTED NOT DETECTED Final   Carbapenem resistance NOT DETECTED NOT DETECTED Final   Haemophilus influenzae NOT DETECTED NOT DETECTED Final   Neisseria meningitidis NOT DETECTED NOT DETECTED Final   Pseudomonas aeruginosa NOT DETECTED NOT DETECTED Final   Candida albicans NOT DETECTED NOT DETECTED Final   Candida glabrata NOT DETECTED NOT DETECTED Final   Candida krusei NOT DETECTED NOT DETECTED Final   Candida parapsilosis NOT DETECTED NOT  DETECTED Final   Candida tropicalis NOT DETECTED NOT DETECTED Final    Comment: Performed at [facility], [address] [place], [place] [address]  SARS Coronavirus 2 by RT PCR (hospital order, performed in [facility] hospital lab)     Status: None   Collection Time: 04/23/20  3:46 PM  Result Value Ref Range Status   SARS Coronavirus 2 NEGATIVE NEGATIVE Final    Comment: (NOTE) SARS-CoV-2 target nucleic acids are NOT DETECTED.  The SARS-CoV-2 RNA is generally detectable in upper and lower respiratory specimens during the acute phase of infection. The lowest concentration of SARS-CoV-2 viral copies this assay can detect is 250 copies / mL. A negative result does not preclude SARS-CoV-2 infection and should not be used as the sole basis for treatment or other patient management decisions.  A negative result may occur with improper specimen collection / handling, submission of specimen other than nasopharyngeal swab, presence of viral mutation(s) within the areas  targeted by this assay, and inadequate number of viral copies (<250 copies / mL). A negative result must be combined with clinical observations, patient history, and epidemiological information.  Fact Sheet for Patients:   StrictlyIdeas.no  Fact Sheet for Healthcare Providers: BankingDealers.co.za  This test is not yet approved or  cleared by the [place] FDA and has been authorized for detection and/or diagnosis of SARS-CoV-2 by FDA under an Emergency Use Authorization (EUA).  This EUA will remain in effect (meaning this test can be used) for the duration of the COVID-19 declaration under Section 564(b)(1) of the Act, 21 U.S.C. section 360bbb-3(b)(1), unless the authorization is terminated or revoked sooner.  Performed at [facility], [address] [place],  [address]     Procedures and diagnostic studies:  CT ABDOMEN PELVIS WO CONTRAST  Result Date: 04/23/2020 CLINICAL DATA:  69 year old male with history of abdominal pain and fever. EXAM: CT ABDOMEN AND PELVIS WITHOUT CONTRAST TECHNIQUE: Multidetector CT imaging of the abdomen and pelvis was performed following the standard protocol without IV contrast. COMPARISON:  No priors. FINDINGS: Lower chest: Trace left pleural effusion lying dependently. Diffuse low attenuation throughout the intravascular compartment, suggestive of anemia. Atherosclerotic calcifications in the left anterior descending, left circumflex and right coronary arteries. Calcifications of the aortic valve. Hepatobiliary: Liver has a shrunken appearance and nodular contour, indicative of underlying cirrhosis. Calcified granulomas are noted in the liver. No discrete cystic or solid hepatic lesions are other identified on today's noncontrast CT examination. Calcified gallstones are noted lying dependently in the gallbladder measuring up to 9 mm. Gallbladder is not distended. Pancreas: No definite  pancreatic mass or peripancreatic fluid collections or inflammatory changes are noted on today's noncontrast CT examination. Spleen: Spleen is mildly enlarged measuring 16.7 x 7.1 x 16.9 cm (estimated splenic volume of 1,002 mL) . Adrenals/Urinary Tract: There are no abnormal calcifications within the collecting system of either kidney, along the course of either ureter, or within the lumen of the urinary bladder. No hydroureteronephrosis or perinephric stranding to suggest urinary tract obstruction at this time. The unenhanced appearance of the kidneys is unremarkable bilaterally. Unenhanced appearance of the urinary bladder is normal. Bilateral adrenal glands are normal in appearance. Stomach/Bowel: Unenhanced appearance of the stomach is normal. No pathologic dilatation of small bowel or colon. Appendix is not confidently identified may be surgically absent. Vascular/Lymphatic: Aortic atherosclerosis. Numerous portosystemic collateral pathways, most notable for large splenorenal collateral vessels. No lymphadenopathy noted in the abdomen or pelvis. Reproductive: Prostate gland and seminal  vesicles are unremarkable in appearance. Large right-sided hydrocele incompletely imaged. Other: Small umbilical hernia containing only omental fat. Small volume of ascites. No pneumoperitoneum. Musculoskeletal: There are no aggressive appearing lytic or blastic lesions noted in the visualized portions of the skeleton. IMPRESSION: 1. Morphologic changes in the liver compatible with cirrhosis. This is associated with evidence of portal venous hypertension, including splenomegaly and numerous portosystemic collateral vessels. 2. Cholelithiasis without evidence of acute cholecystitis at this time. Small umbilical hernia containing only omental fat large right-sided hydrocele incompletely imaged. 3. Trace left pleural effusion lying dependent 4. Diffuse low attenuation throughout the intravascular compartment, suggesting anemia. 5.  Aortic atherosclerosis, in addition to least 3 vessel coronary artery disease. Please note that although the presence of coronary artery calcium documents the presence of coronary artery disease, the severity of this disease and any potential stenosis cannot be assessed on this non-gated CT examination. Assessment for potential risk factor modification, dietary therapy or pharmacologic therapy may be warranted, if clinically indicated. 6. There are calcifications of the aortic valve. Echocardiographic correlation for evaluation of potential valvular dysfunction may be warranted if clinically indicated. 7. Additional incidental findings, as above. Electronically Signed   By: Vinnie Langton M.D.   On: 04/23/2020 18:44   ECHOCARDIOGRAM COMPLETE  Result Date: 04/25/2020    ECHOCARDIOGRAM REPORT   Patient Name:   [name] Date of Exam: 04/24/2020 Medical Rec #:  [id number]    Height:       70.0 in Accession #:    [id number]   Weight:       304.0 lb Date of Birth:  [birth year]     BSA:          2.494 m Patient Age:    19 years     BP:           136/65 mmHg Patient Gender: M            HR:           63 bpm. Exam Location:  [facility] Procedure: 2D Echo, Cardiac Doppler and Color Doppler Indications:     CHF-Acute Diastolic 423.53 / I14.43  History:         Patient has prior history of Echocardiogram examinations. CHF;                  Risk Factors:Hypertension.  Sonographer:     Alyse Low Roar Referring Phys:  [id number] Athena Masse Diagnosing Phys: Kathlyn Sacramento MD IMPRESSIONS  1. Left ventricular ejection fraction, by estimation, is 50 to 55%. The left ventricle has low normal function. The left ventricle has no regional wall motion abnormalities. The left ventricular internal cavity size was mildly dilated. There is mild left ventricular hypertrophy. Left ventricular diastolic parameters are consistent with Grade II diastolic dysfunction (pseudonormalization).  2. Right ventricular systolic function is normal. The right  ventricular size is normal. There is mildly elevated pulmonary artery systolic pressure.  3. Left atrial size was moderately dilated.  4. Right atrial size was mildly dilated.  5. The mitral valve is normal in structure. Trivial mitral valve regurgitation. No evidence of mitral stenosis.  6. The aortic valve is abnormal. Aortic valve regurgitation is not visualized. Mild to moderate aortic valve stenosis. Aortic valve area, by VTI measures 1.36 cm. Aortic valve mean gradient measures 14.3 mmHg.  7. The inferior vena cava is dilated in size with <50% respiratory variability, suggesting right atrial pressure of 15 mmHg. FINDINGS  Left Ventricle: Left ventricular ejection fraction,  by estimation, is 50 to 55%. The left ventricle has low normal function. The left ventricle has no regional wall motion abnormalities. The left ventricular internal cavity size was mildly dilated. There is mild left ventricular hypertrophy. Left ventricular diastolic parameters are consistent with Grade II diastolic dysfunction (pseudonormalization). Right Ventricle: The right ventricular size is normal. No increase in right ventricular wall thickness. Right ventricular systolic function is normal. There is mildly elevated pulmonary artery systolic pressure. The tricuspid regurgitant velocity is 2.59  m/s, and with an assumed right atrial pressure of 15 mmHg, the estimated right ventricular systolic pressure is 24.5 mmHg. Left Atrium: Left atrial size was moderately dilated. Right Atrium: Right atrial size was mildly dilated. Pericardium: There is no evidence of pericardial effusion. Mitral Valve: The mitral valve is normal in structure. Normal mobility of the mitral valve leaflets. Moderate mitral annular calcification. Trivial mitral valve regurgitation. No evidence of mitral valve stenosis. Tricuspid Valve: The tricuspid valve is normal in structure. Tricuspid valve regurgitation is trivial. No evidence of tricuspid stenosis. Aortic  Valve: The aortic valve is abnormal. . There is mild thickening and moderate calcification of the aortic valve. Aortic valve regurgitation is not visualized. Mild to moderate aortic stenosis is present. There is mild thickening of the aortic valve. There is moderate calcification of the aortic valve. Aortic valve mean gradient measures 14.3 mmHg. Aortic valve peak gradient measures 24.1 mmHg. Aortic valve area, by VTI measures 1.36 cm. Pulmonic Valve: The pulmonic valve was normal in structure. Pulmonic valve regurgitation is not visualized. No evidence of pulmonic stenosis. Aorta: The aortic root is normal in size and structure. Venous: The inferior vena cava is dilated in size with less than 50% respiratory variability, suggesting right atrial pressure of 15 mmHg. IAS/Shunts: No atrial level shunt detected by color flow Doppler.  LEFT VENTRICLE PLAX 2D LVIDd:         5.46 cm  Diastology LVIDs:         4.09 cm  LV e' lateral:   8.27 cm/s LV PW:         1.22 cm  LV E/e' lateral: 17.9 LV IVS:        1.34 cm  LV e' medial:    8.05 cm/s LVOT diam:     1.70 cm  LV E/e' medial:  18.4 LV SV:         76 LV SV Index:   30 LVOT Area:     2.27 cm  RIGHT VENTRICLE RV Mid diam:    3.78 cm RV S prime:     13.70 cm/s TAPSE (M-mode): 2.2 cm LEFT ATRIUM              Index       RIGHT ATRIUM           Index LA diam:        4.60 cm  1.84 cm/m  RA Area:     22.10 cm LA Vol (A2C):   117.0 ml 46.92 ml/m RA Volume:   71.80 ml  28.79 ml/m LA Vol (A4C):   101.0 ml 40.50 ml/m LA Biplane Vol: 112.0 ml 44.91 ml/m  AORTIC VALVE                    PULMONIC VALVE AV Area (Vmax):    1.35 cm     PV Vmax:        1.45 m/s AV Area (Vmean):   1.39 cm     PV Peak  grad:   8.4 mmHg AV Area (VTI):     1.36 cm     RVOT Peak grad: 5 mmHg AV Vmax:           245.50 cm/s AV Vmean:          178.333 cm/s AV VTI:            0.555 m AV Peak Grad:      24.1 mmHg AV Mean Grad:      14.3 mmHg LVOT Vmax:         146.00 cm/s LVOT Vmean:        109.000 cm/s  LVOT VTI:          0.333 m LVOT/AV VTI ratio: 0.60  AORTA Ao Root diam: 3.00 cm MITRAL VALVE                TRICUSPID VALVE MV Area (PHT): 3.27 cm     TR Peak grad:   26.8 mmHg MV Decel Time: 232 msec     TR Vmax:        259.00 cm/s MV E velocity: 148.00 cm/s MV A velocity: 107.00 cm/s  SHUNTS MV E/A ratio:  1.38         Systemic VTI:  0.33 m MV A Prime:    11.4 cm/s    Systemic Diam: 1.70 cm Kathlyn Sacramento MD Electronically signed by Kathlyn Sacramento MD Signature Date/Time: 04/25/2020/8:22:25 AM    Final                LOS: 2 days   Moody Robben  [facility]     04/25/2020, 4:13 PM

## 2020-04-26 ENCOUNTER — Encounter: Payer: Self-pay | Admitting: Internal Medicine

## 2020-04-26 DIAGNOSIS — Z7189 Other specified counseling: Secondary | ICD-10-CM

## 2020-04-26 DIAGNOSIS — B961 Klebsiella pneumoniae [K. pneumoniae] as the cause of diseases classified elsewhere: Secondary | ICD-10-CM

## 2020-04-26 DIAGNOSIS — E1121 Type 2 diabetes mellitus with diabetic nephropathy: Secondary | ICD-10-CM

## 2020-04-26 DIAGNOSIS — J9601 Acute respiratory failure with hypoxia: Secondary | ICD-10-CM

## 2020-04-26 DIAGNOSIS — R7881 Bacteremia: Secondary | ICD-10-CM | POA: Diagnosis present

## 2020-04-26 DIAGNOSIS — N184 Chronic kidney disease, stage 4 (severe): Secondary | ICD-10-CM

## 2020-04-26 DIAGNOSIS — Z515 Encounter for palliative care: Secondary | ICD-10-CM

## 2020-04-26 DIAGNOSIS — Z794 Long term (current) use of insulin: Secondary | ICD-10-CM

## 2020-04-26 DIAGNOSIS — D649 Anemia, unspecified: Secondary | ICD-10-CM

## 2020-04-26 LAB — CULTURE, BLOOD (ROUTINE X 2): Special Requests: ADEQUATE

## 2020-04-26 LAB — GLUCOSE, CAPILLARY
Glucose-Capillary: 146 mg/dL — ABNORMAL HIGH (ref 70–99)
Glucose-Capillary: 193 mg/dL — ABNORMAL HIGH (ref 70–99)
Glucose-Capillary: 185 mg/dL — ABNORMAL HIGH (ref 70–99)
Glucose-Capillary: 239 mg/dL — ABNORMAL HIGH (ref 70–99)

## 2020-04-26 LAB — BASIC METABOLIC PANEL
Anion gap: 10 (ref 5–15)
BUN: 74 mg/dL — ABNORMAL HIGH (ref 8–23)
CO2: 27 mmol/L (ref 22–32)
Calcium: 8.7 mg/dL — ABNORMAL LOW (ref 8.9–10.3)
Chloride: 101 mmol/L (ref 98–111)
Creatinine, Ser: 3.14 mg/dL — ABNORMAL HIGH (ref 0.61–1.24)
GFR calc Af Amer: 22 mL/min — ABNORMAL LOW (ref >60)
GFR calc non Af Amer: 19 mL/min — ABNORMAL LOW (ref >60)
Glucose, Bld: 155 mg/dL — ABNORMAL HIGH (ref 70–99)
Potassium: 3.3 mmol/L — ABNORMAL LOW (ref 3.5–5.1)
Sodium: 138 mmol/L (ref 135–145)

## 2020-04-26 LAB — BLOOD GAS, VENOUS
Acid-Base Excess: 1.6 mmol/L (ref 0.0–2.0)
Bicarbonate: 25.7 mmol/L (ref 20.0–28.0)
O2 Saturation: 89.2 %
Patient temperature: 37
pCO2, Ven: 37 mmHg — ABNORMAL LOW (ref 44.0–60.0)
pH, Ven: 7.45 — ABNORMAL HIGH (ref 7.250–7.430)
pO2, Ven: 54 mmHg — ABNORMAL HIGH (ref 32.0–45.0)

## 2020-04-26 LAB — MAGNESIUM: Magnesium: 1.7 mg/dL (ref 1.7–2.4)

## 2020-04-26 MED ORDER — BENZONATATE 100 MG PO CAPS: 100.0000 mg | ORAL_CAPSULE | Freq: Three times a day (TID) | ORAL | Status: DC | PRN

## 2020-04-26 MED ORDER — PHENOL 1.4 % MT LIQD
1.0000 | OROMUCOSAL | Status: DC | PRN
  Administered 2020-04-26: 1 via OROMUCOSAL
  Filled 2020-04-26: qty 177

## 2020-04-26 MED ORDER — CEFAZOLIN SODIUM-DEXTROSE 2-4 GM/100ML-% IV SOLN
2.0000 g | Freq: Three times a day (TID) | INTRAVENOUS | Status: DC
  Administered 2020-04-27 – 2020-05-01 (×13): 2 g via INTRAVENOUS
  Filled 2020-04-26 (×14): qty 100

## 2020-04-26 NOTE — Care Management Important Message (Signed)
Important Message  Patient Details  Name: Corey West MRN: [id number] Date of Birth: [birth year]   Medicare Important Message Given:  Yes     Dannette Barbara 04/26/2020, 11:26 AM

## 2020-04-26 NOTE — Consult Note (Addendum)
Consultation Note Date: 04/26/2020   Patient Name: Corey West  DOB: Sep 20, 1951  MRN: [id number]  Age / Sex: 69 y.o., male  PCP: Leonel Ramsay, MD Referring Physician: Jennye Boroughs, MD  Reason for Consultation: Establishing goals of care and Psychosocial/spiritual support  HPI/Patient Profile: 69 y.o. male  with past medical history of hepatic cirrhosis with esophageal varices and portal hypertension gastropathy, heart failure with preserved ejection fraction 60 to 65% in March 2021, paroxysmal A. fib not on anticoagulation, type 2 diabetes insulin-dependent, CKD 4, HTN/HLD, chronic venous stasis dermatitis and edema of bilateral lower extremities, anxiety, obesity, OSA noncompliant with CPAP, admitted on 04/23/2020 with sepsis secondary to Klebsiella pneumonia bacteremia, acute gastroenteritis, acute on chronic heart failure.   Clinical Assessment and Goals of Care: I have reviewed medical records including EPIC notes, labs and imaging, examined the patient and met at bedside with Mr. Baillie to discuss diagnosis prognosis, Craig, EOL wishes, disposition and options.  Mr. Armstrong is lying quietly in bed.  He greets me making and keeping eye contact.  He is alert and oriented, able to make his needs known.  There is no family at bedside at this time.I introduced Palliative Medicine as specialized medical care for people living with serious illness. It focuses on providing relief from the symptoms and stress of a serious illness.   As far as functional and nutritional status, Mr. Rosko is independent with IADLs/ADLs.  He has assistance from his niece, [name], when needed.  We talked about 5 hospital stays and 1 ED visit in the last 6 months.  Mr. Coppolino tells me that he believes that he was discharged too soon each time.  He states that the doctors do not communicate with each other.  We discussed current  illness and what it means in the larger context of on-going co-morbidities.  Natural disease trajectory and expectations at EOL were discussed.  I attempted to discuss the chronic illness pathway, in particular related to his heart and kidney issues, but Mr. Scearce continues to speak over me.  He tells me that he has a trusted physician, Dr. Penni Homans, who he sees sometimes twice a week.    We talk about sepsis/bacteremia.  Mr. Soter tells me that he has been told he has blood infection, but then later told that he does not have blood infection.  He states that he hears this every time he is hospitalized.  I share that he is positive for bacteremia, and this is a very serious illness.  He briefly talks about antibiotics and then goes on to talk about his kidney function.  We talked about some what if's and maybe's.  Mr. Labreck tells me that he would not want to take dialysis, but he would.  He tells me that he wants to continue to live at all cost.  He tells me that he would accept home health services, but not short-term rehab.  He will continue to rehospitalize as needed, and at this time is not interested in outpatient  palliative services.  Advanced directives, concepts specific to code status, artifical feeding and hydration, and rehospitalization were considered and discussed.  Mr. Spiers tells me that he would want any and all measures to keep him alive.  I ask how long he would except life support, 2 weeks, 2 months, forever?  Mr. Beagle tells me that he would not want to continue if he did not have quality of life.  Questions and concerns were addressed.  PMT to continue to follow  Conference with attending, cardiology, bedside nursing staff, transition of care team related to patient condition, needs, goals of care, disposition.  HCPOA   NEXT OF KIN -niece, [name].  Mr. Mcmillon tells me that he was never married, has no children.   SUMMARY OF RECOMMENDATIONS   At this point full scope/full  code Continue to rehospitalize as needed. Would take hemodialysis if it prolong his life Declines outpatient palliative services   Code Status/Advance Care Planning:  Full code  Symptom Management:   Per hospitalist, no additional needs at this time.  Palliative Prophylaxis:   Frequent Pain Assessment, Palliative Wound Care and Turn Reposition  Additional Recommendations (Limitations, Scope, Preferences):  Full Scope Treatment  Psycho-social/Spiritual:   Desire for further Chaplaincy support:no  Additional Recommendations: Caregiving  Support/Resources  Prognosis:   Unable to determine, based on outcomes.  6 months or less would not be surprising based on 5 hospital stays, and 1 ED visit in 6 months, morbid obesity, poor functional status, chronic illness burden.  Discharge Planning: To be determined, based on outcomes.  Needs physical therapy evaluation.      Primary Diagnoses: Present on Admission: . Acute hypoxemic respiratory failure ([place]) . Cirrhosis ([place]) . Esophageal varices ([place]) . Sepsis ([place]) . Obesity, Class III, BMI 40-49.9 (morbid obesity) ([place]) . Acute on chronic diastolic CHF (congestive heart failure) ([place]) . Chronic venous stasis dermatitis of both lower extremities . Paroxysmal atrial fibrillation (HCC) . Chronic anemia . CKD (chronic kidney disease) stage 4, GFR 15-29 ml/min (HCC)   I have reviewed the medical record, interviewed the patient and family, and examined the patient. The following aspects are pertinent.  Past Medical History:  Diagnosis Date  . Anemia   . Anxiety    controlled;   . Arthritis   . AVM (arteriovenous malformation) of stomach, acquired with hemorrhage   . Barrett's esophagus   . CHF (congestive heart failure) ([place])   . Chronic kidney disease    renal infufficiency  . Cirrhosis ([place])   . Depression    controlled;   Marland Kitchen Diabetes mellitus without complication ([place])    not controlled, taking insulin but sugar  continues to run high;   . Edema   . Esophageal varices ([place])   . GAVE (gastric antral vascular ectasia)   . GERD (gastroesophageal reflux disease)   . History of hiatal hernia   . Hyperlipidemia   . Hypertension    controlled well;   . Liver cirrhosis ([place])   . Nephropathy, diabetic ([place])   . Obesity   . Pancytopenia ([place])   . Polyp, stomach    with chronic blood loss  . Sleep apnea    does not wear a cpap, Medicare would not pay for it   . Venous stasis dermatitis of both lower extremities   . Venous stasis of both lower extremities    with cellulitis   Social History   Socioeconomic History  . Marital status: Single    Spouse name: Not on file  .  Number of children: Not on file  . Years of education: Not on file  . Highest education level: Not on file  Occupational History  . Occupation: disabled  Tobacco Use  . Smoking status: Former Smoker    Years: 20.00    Types: Cigars, Cigarettes  . Smokeless tobacco: Never Used  Vaping Use  . Vaping Use: Never used  Substance and Sexual Activity  . Alcohol use: No    Comment: stopped 15 years ago  . Drug use: No  . Sexual activity: Not Currently  Other Topics Concern  . Not on file  Social History Narrative   Lives alone, neighbor helps occasionally    Social Determinants of Health   Financial Resource Strain:   . Difficulty of Paying Living Expenses:   Food Insecurity:   . Worried About Charity fundraiser in the Last Year:   . Arboriculturist in the Last Year:   Transportation Needs:   . Film/video editor (Medical):   Marland Kitchen Lack of Transportation (Non-Medical):   Physical Activity:   . Days of Exercise per Week:   . Minutes of Exercise per Session:   Stress:   . Feeling of Stress :   Social Connections:   . Frequency of Communication with Friends and Family:   . Frequency of Social Gatherings with Friends and Family:   . Attends Religious Services:   . Active Member of Clubs or Organizations:   .  Attends Archivist Meetings:   Marland Kitchen Marital Status:    Family History  Problem Relation Age of Onset  . Diabetes Other   . Transient ischemic attack Father   . CAD Father    Scheduled Meds: . amLODipine  5 mg Oral Daily  . atorvastatin  20 mg Oral Daily  . enoxaparin (LOVENOX) injection  40 mg Subcutaneous Q12H  . ferrous sulfate  325 mg Oral Daily  . hydrALAZINE  25 mg Oral BID  . insulin aspart  0-20 Units Subcutaneous TID WC  . insulin aspart  0-5 Units Subcutaneous QHS  . lactulose  60 g Oral Daily  . losartan  25 mg Oral Daily  . pantoprazole  40 mg Oral BID  . potassium chloride  40 mEq Oral Daily  . rifaximin  550 mg Oral BID  . sodium chloride flush  3 mL Intravenous Q12H  . spironolactone  25 mg Oral Daily   Continuous Infusions: . sodium chloride    . [START ON 04/27/2020]  ceFAZolin (ANCEF) IV    . furosemide (LASIX) infusion 10 mg/hr (04/26/20 0422)   PRN Meds:.sodium chloride, acetaminophen, benzonatate, ipratropium-albuterol, LORazepam, ondansetron (ZOFRAN) IV, oxyCODONE, phenol, sodium chloride, sodium chloride flush Medications Prior to Admission:  Prior to Admission medications   Medication Sig Start Date End Date Taking? Authorizing Provider  amLODipine (NORVASC) 5 MG tablet Take 1 tablet (5 mg total) by mouth daily. 02/13/20  Yes Wieting, Richard, MD  atorvastatin (LIPITOR) 20 MG tablet Take 20 mg by mouth daily. 03/23/20  Yes [provider]  Ferrous Sulfate (IRON) 325 (65 Fe) MG TABS Take 1 tablet by mouth in the morning and at bedtime.    Yes [provider]  furosemide (LASIX) 40 MG tablet Take 40-160 mg by mouth once daily   Yes [provider]  hydrALAZINE (APRESOLINE) 25 MG tablet Take 1 tablet (25 mg total) by mouth in the morning and at bedtime. 02/27/20  Yes Jennye Boroughs, MD  Ipratropium-Albuterol (COMBIVENT RESPIMAT) 20-100 MCG/ACT  AERS respimat Inhale 1 puff into the lungs every 6 (six) hours. 02/12/20  Yes Wieting,  Richard, MD  lactulose (CHRONULAC) 10 GM/15ML solution Take 45 mLs (30 g total) by mouth 2 (two) times daily. Patient taking differently: 90 mL Oral Daily 02/12/20  Yes Wieting, Richard, MD  LORazepam (ATIVAN) 0.5 MG tablet Take 1 tablet (0.5 mg total) by mouth 3 (three) times daily as needed for Anxiety   Yes [provider]  losartan (COZAAR) 25 MG tablet Take 25 mg by mouth daily.  03/17/20  Yes [provider]  metolazone (ZAROXOLYN) 5 MG tablet Take 5 mg by mouth every Monday, Wednesday, and Friday. Take 30 minutes before Lasix   Yes [provider]  Multiple Vitamin (MULTIVITAMIN) tablet Take 1 tablet by mouth daily.   Yes [provider]  NOVOLOG MIX 70/30 FLEXPEN (70-30) 100 UNIT/ML FlexPen as directed. 0-84 Units Subcutaneous 2 times Daily before meals 03/17/20  Yes [provider]  pantoprazole (PROTONIX) 40 MG tablet Take 1 tablet (40 mg total) by mouth 2 (two) times daily. 03/06/19 04/23/20 Yes Dustin Flock, MD  potassium chloride (KLOR-CON) 10 MEQ tablet Take 10 mEq by mouth daily. 04/21/20  Yes [provider]  rifaximin (XIFAXAN) 550 MG TABS tablet Take 1 tablet (550 mg total) by mouth 2 (two) times daily. 02/12/20  Yes Wieting, Richard, MD  sodium chloride (OCEAN) 0.65 % SOLN nasal spray Place 1 spray into both nostrils as needed for congestion (nose irritation). 02/12/20  Yes Wieting, Richard, MD  spironolactone (ALDACTONE) 25 MG tablet Take 1 tablet (25 mg total) by mouth daily. 02/13/20  Yes Wieting, Richard, MD  traMADol (ULTRAM) 50 MG tablet Take 1 tablet (50 mg total) by mouth every 8 (eight) hours as needed for moderate pain. 02/27/20  Yes Jennye Boroughs, MD   No Known Allergies Review of Systems  Unable to perform ROS: Other    Physical Exam Vitals and nursing note reviewed.  Constitutional:      General: He is not in acute distress.    Appearance: He is obese.  HENT:     Head: Normocephalic and atraumatic.    Cardiovascular:     Rate and Rhythm: Normal rate.  Pulmonary:     Effort: Pulmonary effort is normal. No tachypnea.  Abdominal:     Palpations: Abdomen is soft.  Musculoskeletal:     Right lower leg: Edema present.     Left lower leg: Edema present.  Skin:    General: Skin is warm and dry.  Neurological:     Mental Status: He is alert and oriented to person, place, and time.  Psychiatric:     Comments: Calm and cooperative      Vital Signs: BP (!) 137/76 (BP Location: Right Arm)   Pulse 72   Temp 97.9 F (36.6 C) (Oral)   Resp 20   Ht 5' 10" (1.778 m)   Wt (!) 133.4 kg   SpO2 99%   BMI 42.20 kg/m  Pain Scale: 0-10 POSS *See Group Information*: 1-Acceptable,Awake and alert Pain Score: 3    SpO2: SpO2: 99 % O2 Device:SpO2: 99 % O2 Flow Rate: .   IO: Intake/output summary:   Intake/Output Summary (Last 24 hours) at 04/26/2020 1331 Last data filed at 04/26/2020 1000 Gross per 24 hour  Intake 1080 ml  Output 2450 ml  Net -1370 ml    LBM: Last BM Date: 04/25/20 Baseline Weight: Weight: (!) 131.5 kg Most recent weight: Weight: (!) 133.4 kg  Palliative Assessment/Data:   Flowsheet Rows     Most Recent Value  Intake Tab  Referral Department Hospitalist  Unit at Time of Referral Cardiac/Telemetry Unit  Palliative Care Primary Diagnosis Sepsis/Infectious Disease  Date Notified 04/24/20  Palliative Care Type New Palliative care  Reason for referral Clarify Goals of Care  Date of Admission 04/23/20  Date first seen by Palliative Care 04/26/20  # of days Palliative referral response time 2 Day(s)  # of days IP prior to Palliative referral 1  Clinical Assessment  Palliative Performance Scale Score 40%  Pain Max last 24 hours Not able to report  Pain Min Last 24 hours Not able to report  Dyspnea Max Last 24 Hours Not able to report  Dyspnea Min Last 24 hours Not able to report  Psychosocial & Spiritual Assessment  Palliative Care Outcomes      Time In:  1340  Time Out: 1430 Time Total: 50 minutes  Greater than 50%  of this time was spent counseling and coordinating care related to the above assessment and plan.  Signed by: Drue Novel, NP   Please contact Palliative Medicine Team phone at [phone] for questions and concerns.  For individual provider: See Shea Evans

## 2020-04-26 NOTE — Progress Notes (Signed)
Progress Note  Patient Name: Corey West Date of Encounter: 04/26/2020  Primary Cardiologist: End  Subjective   Dyspnea improving. No chest pain. Documented UOP of 2.1 L for the past 24 hours with a net - 4.1 L for the admission. Weight 137.8-->133.4 kg. Renal function stable.   Inpatient Medications    Scheduled Meds:  amLODipine  5 mg Oral Daily   atorvastatin  20 mg Oral Daily   enoxaparin (LOVENOX) injection  40 mg Subcutaneous Q12H   ferrous sulfate  325 mg Oral Daily   hydrALAZINE  25 mg Oral BID   insulin aspart  0-20 Units Subcutaneous TID WC   insulin aspart  0-5 Units Subcutaneous QHS   lactulose  60 g Oral Daily   losartan  25 mg Oral Daily   pantoprazole  40 mg Oral BID   potassium chloride  40 mEq Oral Daily   rifaximin  550 mg Oral BID   sodium chloride flush  3 mL Intravenous Q12H   spironolactone  25 mg Oral Daily   Continuous Infusions:  sodium chloride     [START ON 04/27/2020]  ceFAZolin (ANCEF) IV     furosemide (LASIX) infusion 10 mg/hr (04/26/20 0422)   PRN Meds: sodium chloride, acetaminophen, benzonatate, ipratropium-albuterol, LORazepam, ondansetron (ZOFRAN) IV, oxyCODONE, phenol, sodium chloride, sodium chloride flush   Vital Signs    Vitals:   04/26/20 0347 04/26/20 0750 04/26/20 0950 04/26/20 1135  BP: (!) 130/52 (!) 130/51 (!) 124/56 (!) 137/76  Pulse: 75 72  72  Resp: 20     Temp: 97.9 F (36.6 C) 97.7 F (36.5 C)  97.9 F (36.6 C)  TempSrc: Oral Oral  Oral  SpO2: 96% 95%  99%  Weight: (!) 133.4 kg     Height:        Intake/Output Summary (Last 24 hours) at 04/26/2020 1351 Last data filed at 04/26/2020 1000 Gross per 24 hour  Intake 1080 ml  Output 2250 ml  Net -1170 ml   Filed Weights   04/23/20 2132 04/24/20 0308 04/26/20 0347  Weight: (!) 138.1 kg (!) 137.9 kg (!) 133.4 kg    Telemetry    SR with PACs, rare PVC - Personally Reviewed  ECG    No new tracings - Personally Reviewed  Physical  Exam   GEN: No acute distress.   Neck: JVD difficult to assess secondary to body habitus. Cardiac: RRR, I/VI systolic murmur, no rubs, or gallops.  Respiratory: Diminished breath sounds bilaterally.  GI: Soft, nontender, non-distended.   MS: Lower extremities wrapped with mild erythema noted superiorly; No deformity. Neuro:  Alert and oriented x 3; Nonfocal.  Psych: Normal affect.  Labs    Chemistry Recent Labs  Lab 04/23/20 1506 04/23/20 1506 04/24/20 0607 04/25/20 0503 04/26/20 0526  NA 141   < > 138 140 138  K 3.5   < > 3.3* 3.1* 3.3*  CL 104   < > 104 103 101  CO2 24   < > 25 26 27   GLUCOSE 121*   < > 154* 152* 155*  BUN 63*   < > 64* 73* 74*  CREATININE 2.85*   < > 3.03* 3.04* 3.14*  CALCIUM 9.0   < > 8.2* 8.1* 8.7*  PROT 7.7  --   --   --   --   ALBUMIN 3.4*  --   --   --   --   AST 32  --   --   --   --  ALT 19  --   --   --   --   ALKPHOS 91  --   --   --   --   BILITOT 1.0  --   --   --   --   GFRNONAA 22*   < > 20* 20* 19*  GFRAA 25*   < > 23* 23* 22*  ANIONGAP 13   < > 9 11 10    < > = values in this interval not displayed.     Hematology Recent Labs  Lab 04/23/20 1506  WBC 9.6  RBC 2.60*  HGB 7.9*  HCT 24.5*  MCV 94.2  MCH 30.4  MCHC 32.2  RDW 17.4*  PLT 123*    Cardiac EnzymesNo results for input(s): TROPONINI in the last 168 hours. No results for input(s): TROPIPOC in the last 168 hours.   BNP Recent Labs  Lab 04/23/20 1525  BNP 376.4*     DDimer No results for input(s): DDIMER in the last 168 hours.   Radiology    CT ABDOMEN PELVIS WO CONTRAST  Result Date: 04/23/2020 IMPRESSION: 1. Morphologic changes in the liver compatible with cirrhosis. This is associated with evidence of portal venous hypertension, including splenomegaly and numerous portosystemic collateral vessels. 2. Cholelithiasis without evidence of acute cholecystitis at this time. Small umbilical hernia containing only omental fat large right-sided hydrocele  incompletely imaged. 3. Trace left pleural effusion lying dependent 4. Diffuse low attenuation throughout the intravascular compartment, suggesting anemia. 5. Aortic atherosclerosis, in addition to least 3 vessel coronary artery disease. Please note that although the presence of coronary artery calcium documents the presence of coronary artery disease, the severity of this disease and any potential stenosis cannot be assessed on this non-gated CT examination. Assessment for potential risk factor modification, dietary therapy or pharmacologic therapy may be warranted, if clinically indicated. 6. There are calcifications of the aortic valve. Echocardiographic correlation for evaluation of potential valvular dysfunction may be warranted if clinically indicated. 7. Additional incidental findings, as above. Electronically Signed   By: Vinnie Langton M.D.   On: 04/23/2020 18:44   DG Chest Port 1 View  Result Date: 04/23/2020 IMPRESSION: Chronically low lung volumes with some central vascular congestion. No convincing features of frank edema or other acute cardiopulmonary abnormality. Stable cardiomegaly. Electronically Signed   By: Lovena Le M.D.   On: 04/23/2020 15:18    Cardiac Studies   2D echo 04/24/2020: 1. Left ventricular ejection fraction, by estimation, is 50 to 55%. The  left ventricle has low normal function. The left ventricle has no regional  wall motion abnormalities. The left ventricular internal cavity size was  mildly dilated. There is mild  left ventricular hypertrophy. Left ventricular diastolic parameters are  consistent with Grade II diastolic dysfunction (pseudonormalization).  2. Right ventricular systolic function is normal. The right ventricular  size is normal. There is mildly elevated pulmonary artery systolic  pressure.  3. Left atrial size was moderately dilated.  4. Right atrial size was mildly dilated.  5. The mitral valve is normal in structure. Trivial mitral  valve  regurgitation. No evidence of mitral stenosis.  6. The aortic valve is abnormal. Aortic valve regurgitation is not  visualized. Mild to moderate aortic valve stenosis. Aortic valve area, by  VTI measures 1.36 cm. Aortic valve mean gradient measures 14.3 mmHg.  7. The inferior vena cava is dilated in size with <50% respiratory  variability, suggesting right atrial pressure of 15 mmHg.  Patient Profile  69 y.o. male with history of HFpEF, permanent Afib not on [place], CKD stage IV, cirrhosis complicated by portal hypertension, DM2, CKD stage IV, HTN, HLD, chronic anemia, obesity, and OSA not on CPAP who we are seeing for HFpEF.  Assessment & Plan    1. Liver cirrhosis with anasarca/edema: -Suspected to be primarily in the setting of CKD stage IV with cirrhosis, obesity, and lymphedema as well as a component of HFpEF with echo showing Gr2DD and mild to moderate aortic stenosis -Lasix gtt -Spironolactone  -May need RHC to further assess his hemodynamics prior to discharge, pending symptoms, diuresis, and renal function   2. Elevated HS-Tn: -Mildly elevated and likely in the setting of supply demand ischemia in the setting of the above -Echo with low normal LVSF -No indication for heparin gtt at this time  3. Acute on chronic HFpEF: -Exacerbated by liver cirrhosis and CKD -He reports feeling his best with a weight around 270 pounds with a current weight of 294 pounds, down 10 pounds over the past 36-48 hours -Lasix gtt -Spironolactone   4. PAF: -Currently in sinus with PACs -Not on AV nodal blocking medications -CHADS2VASc at least 5 (CHF, HTN, age x 1, DM, vascular disease) -Not on [place] in the setting of portal hypertension and anemia  6. Sepsis with Klensiella PNA and bacteremia: -Management per IM  5. Chronic anemia: -Last CBC with improved HGB of 7.9  6. HTN: -Well controlled -With lymphedema, consider changing amlodipine to alternative antihypertensive   7.  Acute on CKD stage IV: -Stable -Nephrology following  -Monitor  8. Hypokalemia: -Spironolactone as above  For questions or updates, please contact [place] Please consult [email or web address] for contact info under Cardiology/STEMI.    Signed, Christell Faith, PA-C [place] Pager: [phone] 04/26/2020, 1:51 PM

## 2020-04-26 NOTE — Progress Notes (Addendum)
Progress Note    Corey West  ALP:[id number] DOB: Mar 27, 1951  DOA: 04/23/2020 PCP: Leonel Ramsay, MD      Brief Narrative:    Medical records reviewed and are as summarized below:  Corey West is a 69 y.o. 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  He has also been having diarrhea this because he takes lactulose for his "ammonia".  He was febrile (101.8), tachycardic lactic acid was elevated at 3.0.  He was admitted to the hospital for sepsis and acute on chronic diastolic CHF with fluid overload.       Assessment/Plan:   Principal Problem:   Sepsis ([place]) Active Problems:   Cirrhosis ([place])   Esophageal varices (HCC)   Chronic anemia   Chronic venous stasis dermatitis of both lower extremities   Obesity, Class III, BMI 40-49.9 (morbid obesity) (HCC)   Type 2 diabetes mellitus with diabetic nephropathy, with long-term current use of insulin (HCC)   Acute on chronic diastolic CHF (congestive heart failure) (HCC)   CKD (chronic kidney disease) stage 4, GFR 15-29 ml/min (HCC)   Acute hypoxemic respiratory failure (HCC)   Paroxysmal atrial fibrillation (HCC)   COPD with chronic bronchitis (HCC)   Bacteremia due to Klebsiella pneumoniae    Sepsis secondary to klebsiella pneumonia bacteremia: Change IV Rocephin to IV cefazolin.  Acute on chronic diastolic CHF/anasarca: Continue IV Lasix infusion.  Monitor BMP, daily weights and urine output.  2D echo on 04/23/2020 showed EF estimated at 50 to  55%, mild LVH, grade 2 diastolic dysfunction.    Liver cirrhosis with anasarca, history of esophageal varices: Continue diuretics, rifaximin and lactulose.  Elevated troponin: This is likely due to demand ischemia.  Patient has been evaluated by cardiologist.  AKI on CKD stage IV: Creatinine is trending upward.  Monitor BMP.  Follow-up with nephrologist for further recommendations.  Insulin-dependent diabetes mellitus, episode of hypoglycemia on 04/23/2020: NovoLog as needed for hyperglycemia  Hypertension: Continue antihypertensives   Hypokalemia: Improving.  Continue to replete potassium and monitor levels.  Paroxysmal atrial fibrillation: He is not on full dose anticoagulation because of liver disease, kidney disease, thrombocytopenia and chronic anemia.  Chronic venous stasis of bilateral lower extremities: Continue leg wraps   Consulted palliative care team because of multiple comorbidities   Body mass index is 42.2 kg/m.  (Morbid obesity): This complicates overall care and prognosis  Diet Order            Diet heart healthy/carb modified Room service appropriate? Yes; Fluid consistency: Thin  Diet effective now                       Medications:   . amLODipine  5 mg Oral Daily  . atorvastatin  20 mg Oral Daily  . enoxaparin (LOVENOX) injection  40 mg Subcutaneous Q12H  . ferrous sulfate  325 mg Oral Daily  . hydrALAZINE  25 mg Oral BID  . insulin aspart  0-20 Units Subcutaneous TID WC  . insulin aspart  0-5 Units Subcutaneous QHS  . lactulose  60 g Oral Daily  .  losartan  25 mg Oral Daily  . pantoprazole  40 mg Oral BID  . potassium chloride  40 mEq Oral Daily  . rifaximin  550 mg Oral BID  . sodium chloride flush  3 mL Intravenous Q12H  . spironolactone  25 mg Oral Daily   Continuous Infusions: . sodium chloride    . [START ON 04/27/2020]  ceFAZolin (ANCEF) IV    . furosemide (LASIX) infusion 10 mg/hr (04/26/20 0422)     Anti-infectives (From  admission, onward)   Start     Dose/Rate Route Frequency Ordered Stop   04/27/20 0600  ceFAZolin (ANCEF) IVPB 2g/100 mL premix     Discontinue     2 g 200 mL/hr over 30 Minutes Intravenous Every 8 hours 04/26/20 1122     04/25/20 0900  cefTRIAXone (ROCEPHIN) 2 g in sodium chloride 0.9 % 100 mL IVPB  Status:  Discontinued        2 g 200 mL/hr over 30 Minutes Intravenous Every 24 hours 04/24/20 0651 04/26/20 1122   04/24/20 2200  vancomycin (VANCOREADY) IVPB 1500 mg/300 mL  Status:  Discontinued        1,500 mg 150 mL/hr over 120 Minutes Intravenous Every 24 hours 04/23/20 1952 04/24/20 0651   04/24/20 1100  rifaximin (XIFAXAN) tablet 550 mg     Discontinue     550 mg Oral 2 times daily 04/24/20 0955     04/23/20 2200  ceFEPIme (MAXIPIME) 2 g in sodium chloride 0.9 % 100 mL IVPB  Status:  Discontinued        2 g 200 mL/hr over 30 Minutes Intravenous Every 12 hours 04/23/20 1952 04/24/20 0650   04/23/20 1945  metroNIDAZOLE (FLAGYL) IVPB 500 mg  Status:  Discontinued        500 mg 100 mL/hr over 60 Minutes Intravenous Every 8 hours 04/23/20 1936 04/24/20 0649   04/23/20 1600  vancomycin (VANCOREADY) IVPB 1500 mg/300 mL        1,500 mg 150 mL/hr over 120 Minutes Intravenous  Once 04/23/20 1459 04/23/20 2154   04/23/20 1500  ceFEPIme (MAXIPIME) 2 g in sodium chloride 0.9 % 100 mL IVPB        2 g 200 mL/hr over 30 Minutes Intravenous  Once 04/23/20 1452 04/23/20 1640   04/23/20 1500  metroNIDAZOLE (FLAGYL) IVPB 500 mg        500 mg 100 mL/hr over 60 Minutes Intravenous  Once 04/23/20 1452 04/23/20 1640   04/23/20 1500  vancomycin (VANCOCIN) IVPB 1000 mg/200 mL premix        1,000 mg 200 mL/hr over 60 Minutes Intravenous  Once 04/23/20 1452 04/23/20 1805             Family Communication/Anticipated D/C date and plan/Code Status   DVT prophylaxis: enoxaparin (LOVENOX) injection 40 mg Start: 04/24/20 1000     Code Status: Full Code  Family Communication: Plan discussed with  patient Disposition Plan:    Status is: Inpatient  Remains inpatient appropriate because:IV treatments appropriate due to intensity of illness or inability to take PO and Inpatient level of care appropriate due to severity of illness   Dispo: The patient is from: Home              Anticipated d/c is to: Home              Anticipated d/c date is: 3 days              Patient currently is  not medically stable to d/c.           Subjective:   He still has abdominal pain or lower extremity pain.  No shortness of breath or chest pain.  Objective:    Vitals:   04/26/20 0347 04/26/20 0750 04/26/20 0950 04/26/20 1135  BP: (!) 130/52 (!) 130/51 (!) 124/56 (!) 137/76  Pulse: 75 72  72  Resp: 20     Temp: 97.9 F (36.6 C) 97.7 F (36.5 C)  97.9 F (36.6 C)  TempSrc: Oral Oral  Oral  SpO2: 96% 95%  99%  Weight: (!) 133.4 kg     Height:       No data found.   Intake/Output Summary (Last 24 hours) at 04/26/2020 1333 Last data filed at 04/26/2020 1000 Gross per 24 hour  Intake 1080 ml  Output 2450 ml  Net -1370 ml   Filed Weights   04/23/20 2132 04/24/20 0308 04/26/20 0347  Weight: (!) 138.1 kg (!) 137.9 kg (!) 133.4 kg    Exam:  GEN: No acute distress SKIN: No rash EYES: No pallor or icterus ENT: MMM CV: RRR PULM: Clear to auscultation bilaterally ABD: soft, obese and distended, lower abdominal tenderness (less tender) but no rebound tenderness or guarding, +BS CNS: AAO x 3, non focal EXT: Bilateral lower extremity edema up to thighs.  Erythema on bilateral legs and erythema is more pronounced on the left leg.  Tenderness of bilateral legs.   Data Reviewed:   I have personally reviewed following labs and imaging studies:  Labs: Labs show the following:   Basic Metabolic Panel: Recent Labs  Lab 04/23/20 1506 04/23/20 1506 04/24/20 0607 04/24/20 0607 04/25/20 0503 04/26/20 0526  NA 141  --  138  --  140 138  K 3.5   < > 3.3*   < > 3.1* 3.3*  CL  104  --  104  --  103 101  CO2 24  --  25  --  26 27  GLUCOSE 121*  --  154*  --  152* 155*  BUN 63*  --  64*  --  73* 74*  CREATININE 2.85*  --  3.03*  --  3.04* 3.14*  CALCIUM 9.0  --  8.2*  --  8.1* 8.7*  MG  --   --   --   --   --  1.7   < > = values in this interval not displayed.   GFR Estimated Creatinine Clearance: 31 mL/min (A) (by C-G formula based on SCr of 3.14 mg/dL (H)). Liver Function Tests: Recent Labs  Lab 04/23/20 1506  AST 32  ALT 19  ALKPHOS 91  BILITOT 1.0  PROT 7.7  ALBUMIN 3.4*   No results for input(s): LIPASE, AMYLASE in the last 168 hours. No results for input(s): AMMONIA in the last 168 hours. Coagulation profile Recent Labs  Lab 04/23/20 1506  INR 1.3*    CBC: Recent Labs  Lab 04/23/20 1506  WBC 9.6  NEUTROABS 8.8*  HGB 7.9*  HCT 24.5*  MCV 94.2  PLT 123*   Cardiac Enzymes: No results for input(s): CKTOTAL, CKMB, CKMBINDEX, TROPONINI in the last 168 hours. BNP (last 3 results) No results for input(s): PROBNP in the last 8760 hours. CBG: Recent Labs  Lab 04/25/20 1204 04/25/20 1705 04/25/20 2101 04/26/20 0751 04/26/20 1138  GLUCAP 174* 156* 171* 146* 193*   D-Dimer: No results for input(s): DDIMER in the last 72 hours. Hgb A1c: No results for input(s):  HGBA1C in the last 72 hours. Lipid Profile: No results for input(s): CHOL, HDL, LDLCALC, TRIG, CHOLHDL, LDLDIRECT in the last 72 hours. Thyroid function studies: No results for input(s): TSH, T4TOTAL, T3FREE, THYROIDAB in the last 72 hours.  Invalid input(s): FREET3 Anemia work up: No results for input(s): VITAMINB12, FOLATE, FERRITIN, TIBC, IRON, RETICCTPCT in the last 72 hours. Sepsis Labs: Recent Labs  Lab 04/23/20 1506 04/23/20 1944 04/23/20 2142  WBC 9.6  --   --   LATICACIDVEN 3.0* 1.2 1.3    Microbiology Recent Results (from the past 240 hour(s))  Blood Culture (routine x 2)     Status: None (Preliminary result)   Collection Time: 04/23/20  3:06 PM    Specimen: BLOOD  Result Value Ref Range Status   Specimen Description BLOOD RAC  Final   Special Requests   Final    BOTTLES DRAWN AEROBIC AND ANAEROBIC Blood Culture results may not be optimal due to an inadequate volume of blood received in culture bottles   Culture   Final    NO GROWTH 3 DAYS Performed at [facility], [address], [place], [place] [address]    Report Status PENDING  Incomplete  Blood Culture (routine x 2)     Status: Abnormal   Collection Time: 04/23/20  3:06 PM   Specimen: BLOOD RIGHT FOREARM  Result Value Ref Range Status   Specimen Description   Final    BLOOD RIGHT FOREARM Performed at [facility], [place] [address] [place], [place] [address]    Special Requests   Final    BOTTLES DRAWN AEROBIC AND ANAEROBIC Blood Culture adequate volume Performed at [facility], [address] [place], [place] [address]    Culture  Setup Time   Final    GRAM NEGATIVE RODS IN BOTH AEROBIC AND ANAEROBIC BOTTLES CRITICAL RESULT CALLED TO, READ BACK BY AND VERIFIED WITH: SCOTT HALL 04/24/20 AT 0618 HS Performed at [facility], [address], [place], [place] [address]    Culture KLEBSIELLA PNEUMONIAE (A)  Final   Report Status 04/26/2020 FINAL  Final   Organism ID, Bacteria KLEBSIELLA PNEUMONIAE  Final      Susceptibility   Klebsiella pneumoniae - MIC*    AMPICILLIN >=32 RESISTANT Resistant     CEFAZOLIN <=4 SENSITIVE Sensitive     CEFEPIME <=0.12 SENSITIVE Sensitive     CEFTAZIDIME <=1 SENSITIVE Sensitive     CEFTRIAXONE <=0.25 SENSITIVE Sensitive     CIPROFLOXACIN <=0.25 SENSITIVE Sensitive     GENTAMICIN <=1 SENSITIVE Sensitive     IMIPENEM <=0.25 SENSITIVE Sensitive     TRIMETH/SULFA <=20 SENSITIVE Sensitive     AMPICILLIN/SULBACTAM >=32 RESISTANT Resistant     PIP/TAZO <=4 SENSITIVE Sensitive     * KLEBSIELLA PNEUMONIAE  Blood Culture ID Panel (Reflexed)     Status: Abnormal   Collection Time: 04/23/20  3:06 PM  Result  Value Ref Range Status   Enterococcus species NOT DETECTED NOT DETECTED Final   Listeria monocytogenes NOT DETECTED NOT DETECTED Final   Staphylococcus species NOT DETECTED NOT DETECTED Final   Staphylococcus aureus (BCID) NOT DETECTED NOT DETECTED Final   Streptococcus species NOT DETECTED NOT DETECTED Final   Streptococcus agalactiae NOT DETECTED NOT DETECTED Final   Streptococcus pneumoniae NOT DETECTED NOT DETECTED Final   Streptococcus pyogenes NOT DETECTED NOT DETECTED Final   Acinetobacter baumannii NOT DETECTED NOT DETECTED Final   Enterobacteriaceae species DETECTED (A) NOT DETECTED Final    Comment:  Enterobacteriaceae represent a large family of gram-negative bacteria, not a single organism. CRITICAL RESULT CALLED TO, READ BACK BY AND VERIFIED WITH: SCOTT HALL 04/24/20 AT 0618 HS    Enterobacter cloacae complex NOT DETECTED NOT DETECTED Final   Escherichia coli NOT DETECTED NOT DETECTED Final   Klebsiella oxytoca NOT DETECTED NOT DETECTED Final   Klebsiella pneumoniae DETECTED (A) NOT DETECTED Final    Comment: CRITICAL RESULT CALLED TO, READ BACK BY AND VERIFIED WITH: SCOTT HALL 04/24/20 AT 0618 HS    Proteus species NOT DETECTED NOT DETECTED Final   Serratia marcescens NOT DETECTED NOT DETECTED Final   Carbapenem resistance NOT DETECTED NOT DETECTED Final   Haemophilus influenzae NOT DETECTED NOT DETECTED Final   Neisseria meningitidis NOT DETECTED NOT DETECTED Final   Pseudomonas aeruginosa NOT DETECTED NOT DETECTED Final   Candida albicans NOT DETECTED NOT DETECTED Final   Candida glabrata NOT DETECTED NOT DETECTED Final   Candida krusei NOT DETECTED NOT DETECTED Final   Candida parapsilosis NOT DETECTED NOT DETECTED Final   Candida tropicalis NOT DETECTED NOT DETECTED Final    Comment: Performed at [facility], [address] [place], [place] [address]  SARS Coronavirus 2 by RT PCR (hospital order, performed in [place] hospital lab)     Status: None    Collection Time: 04/23/20  3:46 PM  Result Value Ref Range Status   SARS Coronavirus 2 NEGATIVE NEGATIVE Final    Comment: (NOTE) SARS-CoV-2 target nucleic acids are NOT DETECTED.  The SARS-CoV-2 RNA is generally detectable in upper and lower respiratory specimens during the acute phase of infection. The lowest concentration of SARS-CoV-2 viral copies this assay can detect is 250 copies / mL. A negative result does not preclude SARS-CoV-2 infection and should not be used as the sole basis for treatment or other patient management decisions.  A negative result may occur with improper specimen collection / handling, submission of specimen other than nasopharyngeal swab, presence of viral mutation(s) within the areas targeted by this assay, and inadequate number of viral copies (<250 copies / mL). A negative result must be combined with clinical observations, patient history, and epidemiological information.  Fact Sheet for Patients:   StrictlyIdeas.no  Fact Sheet for Healthcare Providers: BankingDealers.co.za  This test is not yet approved or  cleared by the [place] FDA and has been authorized for detection and/or diagnosis of SARS-CoV-2 by FDA under an Emergency Use Authorization (EUA).  This EUA will remain in effect (meaning this test can be used) for the duration of the COVID-19 declaration under Section 564(b)(1) of the Act, 21 U.S.C. section 360bbb-3(b)(1), unless the authorization is terminated or revoked sooner.  Performed at [facility], [address] [place], [place] [address]     Procedures and diagnostic studies:  ECHOCARDIOGRAM COMPLETE  Result Date: 04/25/2020    ECHOCARDIOGRAM REPORT   Patient Name:   [name] Date of Exam: 04/24/2020 Medical Rec #:  [id number]    Height:       70.0 in Accession #:    [id number]   Weight:       304.0 lb Date of Birth:  Oct 04, 1950     BSA:          2.494 m Patient  Age:    70 years     BP:           136/65 mmHg Patient Gender: M            HR:  63 bpm. Exam Location:  [facility] Procedure: 2D Echo, Cardiac Doppler and Color Doppler Indications:     CHF-Acute Diastolic 629.52 / W41.32  History:         Patient has prior history of Echocardiogram examinations. CHF;                  Risk Factors:Hypertension.  Sonographer:     Alyse Low Roar Referring Phys:  [id number] Athena Masse Diagnosing Phys: Kathlyn Sacramento MD IMPRESSIONS  1. Left ventricular ejection fraction, by estimation, is 50 to 55%. The left ventricle has low normal function. The left ventricle has no regional wall motion abnormalities. The left ventricular internal cavity size was mildly dilated. There is mild left ventricular hypertrophy. Left ventricular diastolic parameters are consistent with Grade II diastolic dysfunction (pseudonormalization).  2. Right ventricular systolic function is normal. The right ventricular size is normal. There is mildly elevated pulmonary artery systolic pressure.  3. Left atrial size was moderately dilated.  4. Right atrial size was mildly dilated.  5. The mitral valve is normal in structure. Trivial mitral valve regurgitation. No evidence of mitral stenosis.  6. The aortic valve is abnormal. Aortic valve regurgitation is not visualized. Mild to moderate aortic valve stenosis. Aortic valve area, by VTI measures 1.36 cm. Aortic valve mean gradient measures 14.3 mmHg.  7. The inferior vena cava is dilated in size with <50% respiratory variability, suggesting right atrial pressure of 15 mmHg. FINDINGS  Left Ventricle: Left ventricular ejection fraction, by estimation, is 50 to 55%. The left ventricle has low normal function. The left ventricle has no regional wall motion abnormalities. The left ventricular internal cavity size was mildly dilated. There is mild left ventricular hypertrophy. Left ventricular diastolic parameters are consistent with Grade II diastolic dysfunction  (pseudonormalization). Right Ventricle: The right ventricular size is normal. No increase in right ventricular wall thickness. Right ventricular systolic function is normal. There is mildly elevated pulmonary artery systolic pressure. The tricuspid regurgitant velocity is 2.59  m/s, and with an assumed right atrial pressure of 15 mmHg, the estimated right ventricular systolic pressure is 25.3 mmHg. Left Atrium: Left atrial size was moderately dilated. Right Atrium: Right atrial size was mildly dilated. Pericardium: There is no evidence of pericardial effusion. Mitral Valve: The mitral valve is normal in structure. Normal mobility of the mitral valve leaflets. Moderate mitral annular calcification. Trivial mitral valve regurgitation. No evidence of mitral valve stenosis. Tricuspid Valve: The tricuspid valve is normal in structure. Tricuspid valve regurgitation is trivial. No evidence of tricuspid stenosis. Aortic Valve: The aortic valve is abnormal. . There is mild thickening and moderate calcification of the aortic valve. Aortic valve regurgitation is not visualized. Mild to moderate aortic stenosis is present. There is mild thickening of the aortic valve. There is moderate calcification of the aortic valve. Aortic valve mean gradient measures 14.3 mmHg. Aortic valve peak gradient measures 24.1 mmHg. Aortic valve area, by VTI measures 1.36 cm. Pulmonic Valve: The pulmonic valve was normal in structure. Pulmonic valve regurgitation is not visualized. No evidence of pulmonic stenosis. Aorta: The aortic root is normal in size and structure. Venous: The inferior vena cava is dilated in size with less than 50% respiratory variability, suggesting right atrial pressure of 15 mmHg. IAS/Shunts: No atrial level shunt detected by color flow Doppler.  LEFT VENTRICLE PLAX 2D LVIDd:         5.46 cm  Diastology LVIDs:         4.09 cm  LV e' lateral:  8.27 cm/s LV PW:         1.22 cm  LV E/e' lateral: 17.9 LV IVS:        1.34 cm   LV e' medial:    8.05 cm/s LVOT diam:     1.70 cm  LV E/e' medial:  18.4 LV SV:         76 LV SV Index:   30 LVOT Area:     2.27 cm  RIGHT VENTRICLE RV Mid diam:    3.78 cm RV S prime:     13.70 cm/s TAPSE (M-mode): 2.2 cm LEFT ATRIUM              Index       RIGHT ATRIUM           Index LA diam:        4.60 cm  1.84 cm/m  RA Area:     22.10 cm LA Vol (A2C):   117.0 ml 46.92 ml/m RA Volume:   71.80 ml  28.79 ml/m LA Vol (A4C):   101.0 ml 40.50 ml/m LA Biplane Vol: 112.0 ml 44.91 ml/m  AORTIC VALVE                    PULMONIC VALVE AV Area (Vmax):    1.35 cm     PV Vmax:        1.45 m/s AV Area (Vmean):   1.39 cm     PV Peak grad:   8.4 mmHg AV Area (VTI):     1.36 cm     RVOT Peak grad: 5 mmHg AV Vmax:           245.50 cm/s AV Vmean:          178.333 cm/s AV VTI:            0.555 m AV Peak Grad:      24.1 mmHg AV Mean Grad:      14.3 mmHg LVOT Vmax:         146.00 cm/s LVOT Vmean:        109.000 cm/s LVOT VTI:          0.333 m LVOT/AV VTI ratio: 0.60  AORTA Ao Root diam: 3.00 cm MITRAL VALVE                TRICUSPID VALVE MV Area (PHT): 3.27 cm     TR Peak grad:   26.8 mmHg MV Decel Time: 232 msec     TR Vmax:        259.00 cm/s MV E velocity: 148.00 cm/s MV A velocity: 107.00 cm/s  SHUNTS MV E/A ratio:  1.38         Systemic VTI:  0.33 m MV A Prime:    11.4 cm/s    Systemic Diam: 1.70 cm Kathlyn Sacramento MD Electronically signed by Kathlyn Sacramento MD Signature Date/Time: 04/25/2020/8:22:25 AM    Final                LOS: 3 days   Oliver Heitzenrater  [facility]     04/26/2020, 1:33 PM

## 2020-04-26 NOTE — Progress Notes (Signed)
[facility], [place] 04/26/20  Subjective:   LOS: 3 07/26 0701 - 07/27 0700 In: 1080 [P.O.:1080] Out: 7408 [Urine:3250] Patient known to our practice from outpatient follow-up Admitted from home for worsening shortness of breath, productive cough and Worsening lower extremity edema Patient is Worried about renal function  Objective:  Vital signs in last 24 hours:  Temp:  [97.7 F (36.5 C)-98.6 F (37 C)] 98.1 F (36.7 C) (07/27 1559) Pulse Rate:  [69-75] 74 (07/27 1559) Resp:  [18-20] 20 (07/27 0347) BP: (124-137)/(48-76) 133/48 (07/27 1559) SpO2:  [95 %-99 %] 98 % (07/27 1559) Weight:  [133.4 kg] 133.4 kg (07/27 0347)  Weight change:  Filed Weights   04/23/20 2132 04/24/20 0308 04/26/20 0347  Weight: (!) 138.1 kg (!) 137.9 kg (!) 133.4 kg    Intake/Output:    Intake/Output Summary (Last 24 hours) at 04/26/2020 1639 Last data filed at 04/26/2020 1529 Gross per 24 hour  Intake 600 ml  Output 2900 ml  Net -2300 ml     Physical Exam: General:  Obese gentleman, laying in the bed  HEENT  moist oral mucous membranes  Pulm/lungs  normal breathing effort, decreased breath sounds at bases  CVS/Heart  no rub  Abdomen:   Soft, obese, nontender  Extremities:  2-3+ pitting edema, left leg wrapped    Neurologic:  Alert, able to answer questions appropriately  Skin:  Left leg and thigh dependent redness          Basic Metabolic Panel:  Recent Labs  Lab 04/23/20 1506 04/23/20 1506 04/24/20 0607 04/25/20 0503 04/26/20 0526  NA 141  --  138 140 138  K 3.5  --  3.3* 3.1* 3.3*  CL 104  --  104 103 101  CO2 24  --  25 26 27   GLUCOSE 121*  --  154* 152* 155*  BUN 63*  --  64* 73* 74*  CREATININE 2.85*  --  3.03* 3.04* 3.14*  CALCIUM 9.0   < > 8.2* 8.1* 8.7*  MG  --   --   --   --  1.7   < > = values in this interval not displayed.     CBC: Recent Labs  Lab 04/23/20 1506  WBC 9.6  NEUTROABS 8.8*  HGB 7.9*  HCT 24.5*  MCV 94.2   PLT 123*      Lab Results  Component Value Date   HEPBSAG NON REACTIVE 01/30/2020   HEPBSAB NON REACTIVE 01/30/2020   HEPBIGM NON REACTIVE 01/30/2020      Microbiology:  Recent Results (from the past 240 hour(s))  Blood Culture (routine x 2)     Status: None (Preliminary result)   Collection Time: 04/23/20  3:06 PM   Specimen: BLOOD  Result Value Ref Range Status   Specimen Description BLOOD RAC  Final   Special Requests   Final    BOTTLES DRAWN AEROBIC AND ANAEROBIC Blood Culture results may not be optimal due to an inadequate volume of blood received in culture bottles   Culture   Final    NO GROWTH 3 DAYS Performed at [facility], [address], [place], [place] [address]    Report Status PENDING  Incomplete  Blood Culture (routine x 2)     Status: Abnormal   Collection Time: 04/23/20  3:06 PM   Specimen: BLOOD RIGHT FOREARM  Result Value Ref Range Status   Specimen Description   Final    BLOOD RIGHT FOREARM Performed at  [facility], [place] [address] [place], [place] [address]    Special Requests   Final    BOTTLES DRAWN AEROBIC AND ANAEROBIC Blood Culture adequate volume Performed at [facility], [address] [place], [place] [address]    Culture  Setup Time   Final    GRAM NEGATIVE RODS IN BOTH AEROBIC AND ANAEROBIC BOTTLES CRITICAL RESULT CALLED TO, READ BACK BY AND VERIFIED WITH: SCOTT HALL 04/24/20 AT 0618 HS Performed at [facility], [address] [place], [place] [address]    Culture KLEBSIELLA PNEUMONIAE (A)  Final   Report Status 04/26/2020 FINAL  Final   Organism ID, Bacteria KLEBSIELLA PNEUMONIAE  Final      Susceptibility   Klebsiella pneumoniae - MIC*    AMPICILLIN >=32 RESISTANT Resistant     CEFAZOLIN <=4 SENSITIVE Sensitive     CEFEPIME <=0.12 SENSITIVE Sensitive     CEFTAZIDIME <=1 SENSITIVE Sensitive     CEFTRIAXONE <=0.25 SENSITIVE Sensitive     CIPROFLOXACIN <=0.25 SENSITIVE Sensitive      GENTAMICIN <=1 SENSITIVE Sensitive     IMIPENEM <=0.25 SENSITIVE Sensitive     TRIMETH/SULFA <=20 SENSITIVE Sensitive     AMPICILLIN/SULBACTAM >=32 RESISTANT Resistant     PIP/TAZO <=4 SENSITIVE Sensitive     * KLEBSIELLA PNEUMONIAE  Blood Culture ID Panel (Reflexed)     Status: Abnormal   Collection Time: 04/23/20  3:06 PM  Result Value Ref Range Status   Enterococcus species NOT DETECTED NOT DETECTED Final   Listeria monocytogenes NOT DETECTED NOT DETECTED Final   Staphylococcus species NOT DETECTED NOT DETECTED Final   Staphylococcus aureus (BCID) NOT DETECTED NOT DETECTED Final   Streptococcus species NOT DETECTED NOT DETECTED Final   Streptococcus agalactiae NOT DETECTED NOT DETECTED Final   Streptococcus pneumoniae NOT DETECTED NOT DETECTED Final   Streptococcus pyogenes NOT DETECTED NOT DETECTED Final   Acinetobacter baumannii NOT DETECTED NOT DETECTED Final   Enterobacteriaceae species DETECTED (A) NOT DETECTED Final    Comment: Enterobacteriaceae represent a large family of gram-negative bacteria, not a single organism. CRITICAL RESULT CALLED TO, READ BACK BY AND VERIFIED WITH: SCOTT HALL 04/24/20 AT 0618 HS    Enterobacter cloacae complex NOT DETECTED NOT DETECTED Final   Escherichia coli NOT DETECTED NOT DETECTED Final   Klebsiella oxytoca NOT DETECTED NOT DETECTED Final   Klebsiella pneumoniae DETECTED (A) NOT DETECTED Final    Comment: CRITICAL RESULT CALLED TO, READ BACK BY AND VERIFIED WITH: SCOTT HALL 04/24/20 AT 0618 HS    Proteus species NOT DETECTED NOT DETECTED Final   Serratia marcescens NOT DETECTED NOT DETECTED Final   Carbapenem resistance NOT DETECTED NOT DETECTED Final   Haemophilus influenzae NOT DETECTED NOT DETECTED Final   Neisseria meningitidis NOT DETECTED NOT DETECTED Final   Pseudomonas aeruginosa NOT DETECTED NOT DETECTED Final   Candida albicans NOT DETECTED NOT DETECTED Final   Candida glabrata NOT DETECTED NOT DETECTED Final   Candida krusei  NOT DETECTED NOT DETECTED Final   Candida parapsilosis NOT DETECTED NOT DETECTED Final   Candida tropicalis NOT DETECTED NOT DETECTED Final    Comment: Performed at [facility], [address] [place], [place] [address]  SARS Coronavirus 2 by RT PCR (hospital order, performed in [place] hospital lab)     Status: None   Collection Time: 04/23/20  3:46 PM  Result Value Ref Range Status   SARS Coronavirus 2 NEGATIVE NEGATIVE Final    Comment: (NOTE) SARS-CoV-2  target nucleic acids are NOT DETECTED.  The SARS-CoV-2 RNA is generally detectable in upper and lower respiratory specimens during the acute phase of infection. The lowest concentration of SARS-CoV-2 viral copies this assay can detect is 250 copies / mL. A negative result does not preclude SARS-CoV-2 infection and should not be used as the sole basis for treatment or other patient management decisions.  A negative result may occur with improper specimen collection / handling, submission of specimen other than nasopharyngeal swab, presence of viral mutation(s) within the areas targeted by this assay, and inadequate number of viral copies (<250 copies / mL). A negative result must be combined with clinical observations, patient history, and epidemiological information.  Fact Sheet for Patients:   StrictlyIdeas.no  Fact Sheet for Healthcare Providers: BankingDealers.co.za  This test is not yet approved or  cleared by the [place] FDA and has been authorized for detection and/or diagnosis of SARS-CoV-2 by FDA under an Emergency Use Authorization (EUA).  This EUA will remain in effect (meaning this test can be used) for the duration of the COVID-19 declaration under Section 564(b)(1) of the Act, 21 U.S.C. section 360bbb-3(b)(1), unless the authorization is terminated or revoked sooner.  Performed at [facility], [address] [place], [place]  [address]     Coagulation Studies: No results for input(s): LABPROT, INR in the last 72 hours.  Urinalysis: No results for input(s): COLORURINE, LABSPEC, PHURINE, GLUCOSEU, HGBUR, BILIRUBINUR, KETONESUR, PROTEINUR, UROBILINOGEN, NITRITE, LEUKOCYTESUR in the last 72 hours.  Invalid input(s): APPERANCEUR    Imaging: No results found.   Medications:   . sodium chloride    . [START ON 04/27/2020]  ceFAZolin (ANCEF) IV    . furosemide (LASIX) infusion 10 mg/hr (04/26/20 0422)   . amLODipine  5 mg Oral Daily  . atorvastatin  20 mg Oral Daily  . enoxaparin (LOVENOX) injection  40 mg Subcutaneous Q12H  . ferrous sulfate  325 mg Oral Daily  . hydrALAZINE  25 mg Oral BID  . insulin aspart  0-20 Units Subcutaneous TID WC  . insulin aspart  0-5 Units Subcutaneous QHS  . lactulose  60 g Oral Daily  . losartan  25 mg Oral Daily  . pantoprazole  40 mg Oral BID  . potassium chloride  40 mEq Oral Daily  . rifaximin  550 mg Oral BID  . sodium chloride flush  3 mL Intravenous Q12H  . spironolactone  25 mg Oral Daily   sodium chloride, acetaminophen, benzonatate, ipratropium-albuterol, LORazepam, ondansetron (ZOFRAN) IV, oxyCODONE, phenol, sodium chloride, sodium chloride flush  Assessment/ Plan:  69 y.o. male with   Principal Problem:   Sepsis ([place]) Active Problems:   Cirrhosis ([place])   Esophageal varices (HCC)   Chronic anemia   Chronic venous stasis dermatitis of both lower extremities   Obesity, Class III, BMI 40-49.9 (morbid obesity) ([place])   Type 2 diabetes mellitus with diabetic nephropathy, with long-term current use of insulin (HCC)   Acute on chronic diastolic CHF (congestive heart failure) (HCC)   CKD (chronic kidney disease) stage 4, GFR 15-29 ml/min (HCC)   Acute hypoxemic respiratory failure (HCC)   Paroxysmal atrial fibrillation (HCC)   COPD with chronic bronchitis (HCC)   Bacteremia due to Klebsiella pneumoniae   Palliative care by specialist   DNR (do not resuscitate)  discussion   #.  Acute kidney injury on CKD st 4 Recent Labs    04/23/20 1506 04/24/20 0607 04/25/20 0503 04/26/20 0526  CREATININE 2.85* 3.03* 3.04* 3.14*  AKI is likely secondary to abnormal cardiorenal hemodynamics in setting of liver cirrhosis.  Underlying CKD likely secondary to diabetes. CT abdomen without contrast on July 24 confirmed cirrhosis, portal venous hypertension, cholelithiasis, aortic atherosclerosis, three-vessel coronary disease, aortic valve calcifications, no hydronephrosis Patient is nonoliguric Serum creatinine is stabilizing No acute indication for dialysis at present  #. Anemia of CKD  Lab Results  Component Value Date   HGB 7.9 (L) 04/23/2020  will Discuss Epogen with patient  #. Diabetes type 2 with CKD Hemoglobin A1C (%)  Date Value  05/10/2014 11.0 (H)   Hgb A1c MFr Bld (%)  Date Value  02/23/2020 6.9 (H)   #Cirrhosis with volume overload Currently on IV furosemide infusion. Responding fairly. Continue to monitor renal function closely We may be able to switch to oral diuretic tomorrow  #Hypokalemia Due to aggressive diuresis. Replace orally as necessary.    LOS: 3 [place]y [place] 7/27/20214:39 PM  [facility] [place], [place]

## 2020-04-26 NOTE — Consult Note (Signed)
WOC Nurse Consult Note: Reason for Consult:Patient is known to our service from several previous admissions Wound type:Venous insufficiency, chronic lymphedema Pressure Injury POA: N/A Measurement: Bedside RN to measure bilateral LE wounds and document on Nursing flow sheet today Drainage (amount, consistency, odor)  Periwound: with skin changes consistent with chronic lymphedema Dressing procedure/placement/frequency: Patient is followed by a [place] who applies [employer]na[employer]s in the home twice weekly.  Here in house we will wash and dry legs daily taking care to perform hygienic care between toes and perform wound care using an antimicrobial wound contact layer (xeroform) topped with an ABD pad.  We will secure with Kerlix roll gauze and top with an ACE bandage for mild compression as he is in bed.   Upon discharge, patient can resume previous services with [facility].  [place] nursing team will not follow, but will remain available to this patient, the nursing and medical teams.  Please re-consult if needed. Thanks, Maudie Flakes, MSN, RN, [place], Arther Abbott  Pager# [phone]

## 2020-04-27 LAB — BASIC METABOLIC PANEL
Anion gap: 11 (ref 5–15)
BUN: 78 mg/dL — ABNORMAL HIGH (ref 8–23)
CO2: 28 mmol/L (ref 22–32)
Calcium: 8.5 mg/dL — ABNORMAL LOW (ref 8.9–10.3)
Chloride: 97 mmol/L — ABNORMAL LOW (ref 98–111)
Creatinine, Ser: 2.86 mg/dL — ABNORMAL HIGH (ref 0.61–1.24)
GFR calc Af Amer: 25 mL/min — ABNORMAL LOW (ref >60)
GFR calc non Af Amer: 22 mL/min — ABNORMAL LOW (ref >60)
Glucose, Bld: 214 mg/dL — ABNORMAL HIGH (ref 70–99)
Potassium: 3.4 mmol/L — ABNORMAL LOW (ref 3.5–5.1)
Sodium: 136 mmol/L (ref 135–145)

## 2020-04-27 LAB — GLUCOSE, CAPILLARY
Glucose-Capillary: 184 mg/dL — ABNORMAL HIGH (ref 70–99)
Glucose-Capillary: 235 mg/dL — ABNORMAL HIGH (ref 70–99)
Glucose-Capillary: 210 mg/dL — ABNORMAL HIGH (ref 70–99)
Glucose-Capillary: 200 mg/dL — ABNORMAL HIGH (ref 70–99)

## 2020-04-27 MED ORDER — TORSEMIDE 20 MG PO TABS
40.0000 mg | ORAL_TABLET | Freq: Every day | ORAL | Status: DC
  Administered 2020-04-28: 40 mg via ORAL
  Filled 2020-04-27: qty 2

## 2020-04-27 MED ORDER — OXYCODONE HCL 5 MG PO TABS
5.0000 mg | ORAL_TABLET | Freq: Four times a day (QID) | ORAL | Status: DC | PRN
  Administered 2020-04-27 – 2020-05-02 (×16): 5 mg via ORAL
  Filled 2020-04-27 (×16): qty 1

## 2020-04-27 MED ORDER — TRAMADOL HCL 50 MG PO TABS
50.0000 mg | ORAL_TABLET | Freq: Four times a day (QID) | ORAL | Status: DC | PRN
  Administered 2020-04-27 – 2020-05-03 (×11): 50 mg via ORAL
  Filled 2020-04-27 (×11): qty 1

## 2020-04-27 MED ORDER — POTASSIUM CHLORIDE CRYS ER 20 MEQ PO TBCR: 40.0000 meq | EXTENDED_RELEASE_TABLET | Freq: Once | ORAL | Status: DC

## 2020-04-27 NOTE — Progress Notes (Signed)
[facility], [place] 04/27/20  Subjective:   LOS: 4 07/27 0701 - 07/28 0700 In: 788.3 [P.O.:240; I.V.:548.3] Out: 950 [Urine:950] Patient known to our practice from outpatient follow-up Admitted from home for worsening shortness of breath, productive cough and Worsening lower extremity edema  Overall doing fair Denies any acute complaints except hip pain Sitting up in the chair today  Objective:  Vital signs in last 24 hours:  Temp:  [97.8 F (36.6 C)-98.2 F (36.8 C)] 97.8 F (36.6 C) (07/28 1559) Pulse Rate:  [68-76] 68 (07/28 1559) Resp:  [18-20] 18 (07/28 1559) BP: (131-142)/(49-58) 138/49 (07/28 1559) SpO2:  [97 %-100 %] 100 % (07/28 1559) Weight:  [131.3 kg] 131.3 kg (07/28 0455)  Weight change: -2.132 kg Filed Weights   04/24/20 0308 04/26/20 0347 04/27/20 0455  Weight: (!) 137.9 kg (!) 133.4 kg (!) 131.3 kg    Intake/Output:    Intake/Output Summary (Last 24 hours) at 04/27/2020 1645 Last data filed at 04/27/2020 1133 Gross per 24 hour  Intake 554.25 ml  Output --  Net 554.25 ml     Physical Exam: General:  Obese gentleman,   HEENT  moist oral mucous membranes  Pulm/lungs  normal breathing effort, decreased breath sounds at bases  CVS/Heart  no rub  Abdomen:   Soft, obese, nontender  Extremities:  2+ pitting edema, left leg wrapped    Neurologic:  Alert, able to answer questions appropriately  Skin:  Left leg and thigh dependent redness          Basic Metabolic Panel:  Recent Labs  Lab 04/23/20 1506 04/23/20 1506 04/24/20 0607 04/24/20 0607 04/25/20 0503 04/26/20 0526 04/27/20 0256  NA 141  --  138  --  140 138 136  K 3.5  --  3.3*  --  3.1* 3.3* 3.4*  CL 104  --  104  --  103 101 97*  CO2 24  --  25  --  26 27 28   GLUCOSE 121*  --  154*  --  152* 155* 214*  BUN 63*  --  64*  --  73* 74* 78*  CREATININE 2.85*  --  3.03*  --  3.04* 3.14* 2.86*  CALCIUM 9.0   < > 8.2*   < > 8.1* 8.7* 8.5*  MG  --   --   --    --   --  1.7  --    < > = values in this interval not displayed.     CBC: Recent Labs  Lab 04/23/20 1506  WBC 9.6  NEUTROABS 8.8*  HGB 7.9*  HCT 24.5*  MCV 94.2  PLT 123*      Lab Results  Component Value Date   HEPBSAG NON REACTIVE 01/30/2020   HEPBSAB NON REACTIVE 01/30/2020   HEPBIGM NON REACTIVE 01/30/2020      Microbiology:  Recent Results (from the past 240 hour(s))  Blood Culture (routine x 2)     Status: None (Preliminary result)   Collection Time: 04/23/20  3:06 PM   Specimen: BLOOD  Result Value Ref Range Status   Specimen Description BLOOD RAC  Final   Special Requests   Final    BOTTLES DRAWN AEROBIC AND ANAEROBIC Blood Culture results may not be optimal due to an inadequate volume of blood received in culture bottles   Culture   Final    NO GROWTH 4 DAYS Performed at [facility], [address], [place], [place] [address]  Report Status PENDING  Incomplete  Blood Culture (routine x 2)     Status: Abnormal   Collection Time: 04/23/20  3:06 PM   Specimen: BLOOD RIGHT FOREARM  Result Value Ref Range Status   Specimen Description   Final    BLOOD RIGHT FOREARM Performed at [facility], [place] [address], [place], [place] [address]    Special Requests   Final    BOTTLES DRAWN AEROBIC AND ANAEROBIC Blood Culture adequate volume Performed at [facility], [address] [place], [place] [address]    Culture  Setup Time   Final    GRAM NEGATIVE RODS IN BOTH AEROBIC AND ANAEROBIC BOTTLES CRITICAL RESULT CALLED TO, READ BACK BY AND VERIFIED WITH: SCOTT HALL 04/24/20 AT 0618 HS Performed at [facility], [address] [place], [place] [address]    Culture KLEBSIELLA PNEUMONIAE (A)  Final   Report Status 04/26/2020 FINAL  Final   Organism ID, Bacteria KLEBSIELLA PNEUMONIAE  Final      Susceptibility   Klebsiella pneumoniae - MIC*    AMPICILLIN >=32 RESISTANT Resistant     CEFAZOLIN <=4 SENSITIVE Sensitive      CEFEPIME <=0.12 SENSITIVE Sensitive     CEFTAZIDIME <=1 SENSITIVE Sensitive     CEFTRIAXONE <=0.25 SENSITIVE Sensitive     CIPROFLOXACIN <=0.25 SENSITIVE Sensitive     GENTAMICIN <=1 SENSITIVE Sensitive     IMIPENEM <=0.25 SENSITIVE Sensitive     TRIMETH/SULFA <=20 SENSITIVE Sensitive     AMPICILLIN/SULBACTAM >=32 RESISTANT Resistant     PIP/TAZO <=4 SENSITIVE Sensitive     * KLEBSIELLA PNEUMONIAE  Blood Culture ID Panel (Reflexed)     Status: Abnormal   Collection Time: 04/23/20  3:06 PM  Result Value Ref Range Status   Enterococcus species NOT DETECTED NOT DETECTED Final   Listeria monocytogenes NOT DETECTED NOT DETECTED Final   Staphylococcus species NOT DETECTED NOT DETECTED Final   Staphylococcus aureus (BCID) NOT DETECTED NOT DETECTED Final   Streptococcus species NOT DETECTED NOT DETECTED Final   Streptococcus agalactiae NOT DETECTED NOT DETECTED Final   Streptococcus pneumoniae NOT DETECTED NOT DETECTED Final   Streptococcus pyogenes NOT DETECTED NOT DETECTED Final   Acinetobacter baumannii NOT DETECTED NOT DETECTED Final   Enterobacteriaceae species DETECTED (A) NOT DETECTED Final    Comment: Enterobacteriaceae represent a large family of gram-negative bacteria, not a single organism. CRITICAL RESULT CALLED TO, READ BACK BY AND VERIFIED WITH: SCOTT HALL 04/24/20 AT 0618 HS    Enterobacter cloacae complex NOT DETECTED NOT DETECTED Final   Escherichia coli NOT DETECTED NOT DETECTED Final   Klebsiella oxytoca NOT DETECTED NOT DETECTED Final   Klebsiella pneumoniae DETECTED (A) NOT DETECTED Final    Comment: CRITICAL RESULT CALLED TO, READ BACK BY AND VERIFIED WITH: SCOTT HALL 04/24/20 AT 0618 HS    Proteus species NOT DETECTED NOT DETECTED Final   Serratia marcescens NOT DETECTED NOT DETECTED Final   Carbapenem resistance NOT DETECTED NOT DETECTED Final   Haemophilus influenzae NOT DETECTED NOT DETECTED Final   Neisseria meningitidis NOT DETECTED NOT DETECTED Final    Pseudomonas aeruginosa NOT DETECTED NOT DETECTED Final   Candida albicans NOT DETECTED NOT DETECTED Final   Candida glabrata NOT DETECTED NOT DETECTED Final   Candida krusei NOT DETECTED NOT DETECTED Final   Candida parapsilosis NOT DETECTED NOT DETECTED Final   Candida tropicalis NOT DETECTED NOT DETECTED Final    Comment: Performed at [facility], [place],  [place], [place] [address]  SARS Coronavirus 2 by RT PCR (hospital order, performed in [place] hospital lab)     Status: None   Collection Time: 04/23/20  3:46 PM  Result Value Ref Range Status   SARS Coronavirus 2 NEGATIVE NEGATIVE Final    Comment: (NOTE) SARS-CoV-2 target nucleic acids are NOT DETECTED.  The SARS-CoV-2 RNA is generally detectable in upper and lower respiratory specimens during the acute phase of infection. The lowest concentration of SARS-CoV-2 viral copies this assay can detect is 250 copies / mL. A negative result does not preclude SARS-CoV-2 infection and should not be used as the sole basis for treatment or other patient management decisions.  A negative result may occur with improper specimen collection / handling, submission of specimen other than nasopharyngeal swab, presence of viral mutation(s) within the areas targeted by this assay, and inadequate number of viral copies (<250 copies / mL). A negative result must be combined with clinical observations, patient history, and epidemiological information.  Fact Sheet for Patients:   StrictlyIdeas.no  Fact Sheet for Healthcare Providers: BankingDealers.co.za  This test is not yet approved or  cleared by the [place] FDA and has been authorized for detection and/or diagnosis of SARS-CoV-2 by FDA under an Emergency Use Authorization (EUA).  This EUA will remain in effect (meaning this test can be used) for the duration of the COVID-19 declaration under Section 564(b)(1) of the Act,  21 U.S.C. section 360bbb-3(b)(1), unless the authorization is terminated or revoked sooner.  Performed at [facility], [address] [place],  [address]     Coagulation Studies: No results for input(s): LABPROT, INR in the last 72 hours.  Urinalysis: No results for input(s): COLORURINE, LABSPEC, PHURINE, GLUCOSEU, HGBUR, BILIRUBINUR, KETONESUR, PROTEINUR, UROBILINOGEN, NITRITE, LEUKOCYTESUR in the last 72 hours.  Invalid input(s): APPERANCEUR    Imaging: No results found.   Medications:   . sodium chloride    .  ceFAZolin (ANCEF) IV 2 g (04/27/20 1437)  . furosemide (LASIX) infusion 10 mg/hr (04/27/20 0552)   . amLODipine  5 mg Oral Daily  . atorvastatin  20 mg Oral Daily  . enoxaparin (LOVENOX) injection  40 mg Subcutaneous Q12H  . ferrous sulfate  325 mg Oral Daily  . hydrALAZINE  25 mg Oral BID  . insulin aspart  0-20 Units Subcutaneous TID WC  . insulin aspart  0-5 Units Subcutaneous QHS  . lactulose  60 g Oral Daily  . losartan  25 mg Oral Daily  . pantoprazole  40 mg Oral BID  . potassium chloride  40 mEq Oral Daily  . rifaximin  550 mg Oral BID  . sodium chloride flush  3 mL Intravenous Q12H  . spironolactone  25 mg Oral Daily   sodium chloride, acetaminophen, benzonatate, ipratropium-albuterol, LORazepam, ondansetron (ZOFRAN) IV, oxyCODONE, phenol, sodium chloride, sodium chloride flush, traMADol  Assessment/ Plan:  69 y.o. male with   Principal Problem:   Sepsis ([place]) Active Problems:   Cirrhosis ([place])   Esophageal varices (HCC)   Chronic anemia   Chronic venous stasis dermatitis of both lower extremities   Obesity, Class III, BMI 40-49.9 (morbid obesity) ([place])   Type 2 diabetes mellitus with diabetic nephropathy, with long-term current use of insulin (HCC)   Acute on chronic diastolic CHF (congestive heart failure) (HCC)   CKD (chronic kidney disease) stage 4, GFR 15-29 ml/min (HCC)   Acute hypoxemic respiratory failure (HCC)    Paroxysmal atrial fibrillation (HCC)   COPD with chronic  bronchitis ([place])   Bacteremia due to Klebsiella pneumoniae   Palliative care by specialist   DNR (do not resuscitate) discussion   #.  Acute kidney injury on CKD st 4 Recent Labs    04/24/20 0607 04/25/20 0503 04/26/20 0526 04/27/20 0256  CREATININE 3.03* 3.04* 3.14* 2.86*  AKI is likely secondary to abnormal cardiorenal hemodynamics in setting of liver cirrhosis.  Underlying CKD likely secondary to diabetes. CT abdomen without contrast on July 24 confirmed cirrhosis, portal venous hypertension, cholelithiasis, aortic atherosclerosis, three-vessel coronary disease, aortic valve calcifications, no hydronephrosis Patient is nonoliguric Serum creatinine is stabilizing and has started to trend down today. No acute indication for dialysis at present  #. Anemia of CKD  Lab Results  Component Value Date   HGB 7.9 (L) 04/23/2020  will Discuss Epogen with patient  #. Diabetes type 2 with CKD Hemoglobin A1C (%)  Date Value  05/10/2014 11.0 (H)   Hgb A1c MFr Bld (%)  Date Value  02/23/2020 6.9 (H)   #Cirrhosis with volume overload Currently on IV furosemide infusion.  Change to oral torsemide Responding fairly. Continue to monitor renal function closely   #Hypokalemia Due to aggressive diuresis. Replace orally as necessary.    LOS: [place] 7/28/20214:45 [facility] [place], [place]

## 2020-04-27 NOTE — Progress Notes (Signed)
Progress Note  Patient Name: Corey West Date of Encounter: 04/27/2020  Primary Cardiologist: Nelva Bush, MD  Subjective   Shortness of breath and swelling/abd girth seem to be improving.  No chest pain.  Bothered by back pain from laying in bed.  Currently sitting up in chair.  Inpatient Medications    Scheduled Meds: . amLODipine  5 mg Oral Daily  . atorvastatin  20 mg Oral Daily  . enoxaparin (LOVENOX) injection  40 mg Subcutaneous Q12H  . ferrous sulfate  325 mg Oral Daily  . hydrALAZINE  25 mg Oral BID  . insulin aspart  0-20 Units Subcutaneous TID WC  . insulin aspart  0-5 Units Subcutaneous QHS  . lactulose  60 g Oral Daily  . losartan  25 mg Oral Daily  . pantoprazole  40 mg Oral BID  . potassium chloride  40 mEq Oral Daily  . rifaximin  550 mg Oral BID  . sodium chloride flush  3 mL Intravenous Q12H  . spironolactone  25 mg Oral Daily   Continuous Infusions: . sodium chloride    .  ceFAZolin (ANCEF) IV 2 g (04/27/20 0533)  . furosemide (LASIX) infusion 10 mg/hr (04/27/20 0552)   PRN Meds: sodium chloride, acetaminophen, benzonatate, ipratropium-albuterol, LORazepam, ondansetron (ZOFRAN) IV, oxyCODONE, phenol, sodium chloride, sodium chloride flush   Vital Signs    Vitals:   04/26/20 1559 04/26/20 2043 04/27/20 0455 04/27/20 0727  BP: (!) 133/48 (!) 133/57 (!) 131/49 (!) 142/58  Pulse: 74 75 76 73  Resp:  20 20 19   Temp: 98.1 F (36.7 C) 98.2 F (36.8 C) 98.1 F (36.7 C) 98 F (36.7 C)  TempSrc: Oral  Oral Oral  SpO2: 98% 99% 97% 99%  Weight:   (!) 131.3 kg   Height:        Intake/Output Summary (Last 24 hours) at 04/27/2020 1224 Last data filed at 04/27/2020 1133 Gross per 24 hour  Intake 554.25 ml  Output 950 ml  Net -395.75 ml   Filed Weights   04/24/20 0308 04/26/20 0347 04/27/20 0455  Weight: (!) 137.9 kg (!) 133.4 kg (!) 131.3 kg    Physical Exam   GEN: Obese, in no acute distress.  HEENT: Grossly normal.  Neck: Supple,  obese, difficult to gauge JVP, no carotid bruits, or masses. Cardiac: RRR, 3/6 SEM @ upper sternal borders, no rubs, or gallops. No clubbing, cyanosis.  1+ bilat LEE above wraps.   Respiratory:  Respirations regular and unlabored, bibasilar crackles. GI: Obese, semi-firm, diffusely tender.  BS + x 4. MS: no deformity or atrophy. Skin: warm and dry, no rash. Neuro:  Strength and sensation are intact. Psych: AAOx3.  Normal affect.  Labs    Chemistry Recent Labs  Lab 04/23/20 1506 04/24/20 0607 04/25/20 0503 04/26/20 0526 04/27/20 0256  NA 141   < > 140 138 136  K 3.5   < > 3.1* 3.3* 3.4*  CL 104   < > 103 101 97*  CO2 24   < > 26 27 28   GLUCOSE 121*   < > 152* 155* 214*  BUN 63*   < > 73* 74* 78*  CREATININE 2.85*   < > 3.04* 3.14* 2.86*  CALCIUM 9.0   < > 8.1* 8.7* 8.5*  PROT 7.7  --   --   --   --   ALBUMIN 3.4*  --   --   --   --   AST 32  --   --   --   --  ALT 19  --   --   --   --   ALKPHOS 91  --   --   --   --   BILITOT 1.0  --   --   --   --   GFRNONAA 22*   < > 20* 19* 22*  GFRAA 25*   < > 23* 22* 25*  ANIONGAP 13   < > 11 10 11    < > = values in this interval not displayed.     Hematology Recent Labs  Lab 04/23/20 1506  WBC 9.6  RBC 2.60*  HGB 7.9*  HCT 24.5*  MCV 94.2  MCH 30.4  MCHC 32.2  RDW 17.4*  PLT 123*    Cardiac Enzymes  Recent Labs  Lab 04/23/20 1525 04/23/20 1854  TROPONINIHS 18* 578*      BNP Recent Labs  Lab 04/23/20 1525  BNP 376.4*      Lipids  Lab Results  Component Value Date   CHOL 141 12/15/2019   HDL 37 (L) 12/15/2019   LDLCALC 93 12/15/2019   TRIG 56 12/15/2019   CHOLHDL 3.8 12/15/2019    HbA1c  Lab Results  Component Value Date   HGBA1C 6.9 (H) 02/23/2020    Radiology    CT ABDOMEN PELVIS WO CONTRAST  Result Date: 04/23/2020 CLINICAL DATA:  69 year old male with history of abdominal pain and fever. EXAM: CT ABDOMEN AND PELVIS WITHOUT CONTRAST TECHNIQUE: Multidetector CT imaging of the abdomen and  pelvis was performed following the standard protocol without IV contrast. COMPARISON:  No priors. FINDINGS: Lower chest: Trace left pleural effusion lying dependently. Diffuse low attenuation throughout the intravascular compartment, suggestive of anemia. Atherosclerotic calcifications in the left anterior descending, left circumflex and right coronary arteries. Calcifications of the aortic valve. Hepatobiliary: Liver has a shrunken appearance and nodular contour, indicative of underlying cirrhosis. Calcified granulomas are noted in the liver. No discrete cystic or solid hepatic lesions are other identified on today's noncontrast CT examination. Calcified gallstones are noted lying dependently in the gallbladder measuring up to 9 mm. Gallbladder is not distended. Pancreas: No definite pancreatic mass or peripancreatic fluid collections or inflammatory changes are noted on today's noncontrast CT examination. Spleen: Spleen is mildly enlarged measuring 16.7 x 7.1 x 16.9 cm (estimated splenic volume of 1,002 mL) . Adrenals/Urinary Tract: There are no abnormal calcifications within the collecting system of either kidney, along the course of either ureter, or within the lumen of the urinary bladder. No hydroureteronephrosis or perinephric stranding to suggest urinary tract obstruction at this time. The unenhanced appearance of the kidneys is unremarkable bilaterally. Unenhanced appearance of the urinary bladder is normal. Bilateral adrenal glands are normal in appearance. Stomach/Bowel: Unenhanced appearance of the stomach is normal. No pathologic dilatation of small bowel or colon. Appendix is not confidently identified may be surgically absent. Vascular/Lymphatic: Aortic atherosclerosis. Numerous portosystemic collateral pathways, most notable for large splenorenal collateral vessels. No lymphadenopathy noted in the abdomen or pelvis. Reproductive: Prostate gland and seminal vesicles are unremarkable in appearance.  Large right-sided hydrocele incompletely imaged. Other: Small umbilical hernia containing only omental fat. Small volume of ascites. No pneumoperitoneum. Musculoskeletal: There are no aggressive appearing lytic or blastic lesions noted in the visualized portions of the skeleton. IMPRESSION: 1. Morphologic changes in the liver compatible with cirrhosis. This is associated with evidence of portal venous hypertension, including splenomegaly and numerous portosystemic collateral vessels. 2. Cholelithiasis without evidence of acute cholecystitis at this time. Small umbilical hernia containing only omental  fat large right-sided hydrocele incompletely imaged. 3. Trace left pleural effusion lying dependent 4. Diffuse low attenuation throughout the intravascular compartment, suggesting anemia. 5. Aortic atherosclerosis, in addition to least 3 vessel coronary artery disease. Please note that although the presence of coronary artery calcium documents the presence of coronary artery disease, the severity of this disease and any potential stenosis cannot be assessed on this non-gated CT examination. Assessment for potential risk factor modification, dietary therapy or pharmacologic therapy may be warranted, if clinically indicated. 6. There are calcifications of the aortic valve. Echocardiographic correlation for evaluation of potential valvular dysfunction may be warranted if clinically indicated. 7. Additional incidental findings, as above. Electronically Signed   By: Vinnie Langton M.D.   On: 04/23/2020 18:44   DG Chest Port 1 View  Result Date: 04/23/2020 CLINICAL DATA:  Dyspnea, worsening shortness of breath and productive cough EXAM: PORTABLE CHEST 1 VIEW COMPARISON:  Radiograph 03/10/2020, CT 12/14/2019 FINDINGS: Chronically low lung volumes with some central vascular congestion similar to the comparison exam. No convincing features of frank edema at this time. No new focal areas of consolidation. No effusion or  pneumothorax is visible. Mild cardiomegaly is similar to prior accounting for differences in technique. Telemetry leads overlie the chest. No acute osseous or soft tissue abnormality. Degenerative changes are present in the imaged spine and shoulders. Dextrocurvature of the thoracic spine similar to prior. IMPRESSION: Chronically low lung volumes with some central vascular congestion. No convincing features of frank edema or other acute cardiopulmonary abnormality. Stable cardiomegaly. Electronically Signed   By: Lovena Le M.D.   On: 04/23/2020 15:18    Telemetry    Sinus rhythm, freq PACs, 1st deg AVB - Personally Reviewed  Cardiac Studies   2D echo 04/24/2020: 1. Left ventricular ejection fraction, by estimation, is 50 to 55%. The  left ventricle has low normal function. The left ventricle has no regional  wall motion abnormalities. The left ventricular internal cavity size was  mildly dilated. There is mild  left ventricular hypertrophy. Left ventricular diastolic parameters are  consistent with Grade II diastolic dysfunction (pseudonormalization).  2. Right ventricular systolic function is normal. The right ventricular  size is normal. There is mildly elevated pulmonary artery systolic  pressure.  3. Left atrial size was moderately dilated.  4. Right atrial size was mildly dilated.  5. The mitral valve is normal in structure. Trivial mitral valve  regurgitation. No evidence of mitral stenosis.  6. The aortic valve is abnormal. Aortic valve regurgitation is not  visualized. Mild to moderate aortic valve stenosis. Aortic valve area, by  VTI measures 1.36 cm. Aortic valve mean gradient measures 14.3 mmHg.  7. The inferior vena cava is dilated in size with <50% respiratory  variability, suggesting right atrial pressure of 15 mmHg.  Patient Profile     69 y.o. 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    Assessment & Plan    1.  Acute on chronic HF w/ preserved EF:  EF 50-55% w/ gr 2 diast dysfxn by echo this admission.  Admitted 7/24 after failing outpt diuretics.  Wt up ~ 10 kg on admission.  Responding well to IV lasix  minus only 161.27ml yesterday (though incontinence noted) and minus 4.5L since admission.  Wt down  from 138.1kg on admission to 131.3 kg this AM.  Wt was 128.5 kg @ oncology visit on 6/15.  Overall, he is feeling better, though still w/ lower ext edema and inc in abd girth.  He remains on lasix gtt. Creat stable @ 2.86. BUN bumping some @ 78.  HR/BP relatively stable.  Cont ARB, hydral, spiro, and amlodipine.  2.  PAF:  Maintaining sinus w/ PACs.  No OAC 2/2 portal HTN (CHA2DS2VASc = 5).  No AVN blocking agent 2/2 wide 1st deg avb.  3.  Elevated HsTroponin:  Mildly elevated in the setting of sepsis, CHF, and CKD.  Echo w/ nl EF and w/o wma's.  Likely represents demand ischemia.  No plan for ischemic eval at this time. Cont statin rx.  No ASA 2/2/ anemia and portal HTN.  Sepsis w/ Klebsiella pneumoniae bacteremia:  1/2 bottles.  Abx per IM.  4.  CKD IV:  Stable. Nephrology following.  5.  Hypokalemia: 3.4 this AM. On spiro and KCl.  6.  Normocytic anemia:  Hgb 7.9 on 7/24.  Neph considering epogen.  Signed, Murray Hodgkins, NP  04/27/2020, 12:24 PM    For questions or updates, please contact   Please consult [email or web address] for contact info under Cardiology/STEMI.

## 2020-04-27 NOTE — Hospital Course (Signed)
Corey West is a 69 y.o. 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  He has also been having diarrhea this because he takes lactulose for his "ammonia".   He was febrile (101.8), tachycardic lactic acid was elevated at 3.0.  He was admitted to the hospital for sepsis and acute on chronic diastolic CHF with fluid overload.

## 2020-04-27 NOTE — Progress Notes (Signed)
PROGRESS NOTE    Corey West   KZS:[id number]  DOB: 03-Jan-1951  PCP: Leonel Ramsay, MD    DOA: 04/23/2020 LOS: 4   Brief Narrative   Corey West is a 69 y.o. 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  He has also been having diarrhea this because he takes lactulose for his "ammonia".   He was febrile (101.8), tachycardic lactic acid was elevated at 3.0.  He was admitted to the hospital for sepsis and acute on chronic diastolic CHF with fluid overload.     Assessment & Plan   Principal Problem:   Sepsis ([place]) Active Problems:   Type 2 diabetes mellitus with diabetic nephropathy, with long-term current use of insulin (HCC)   CKD (chronic kidney disease) stage 4, GFR 15-29 ml/min (HCC)   Obesity, Class III, BMI 40-49.9 (morbid obesity) (HCC)   Cirrhosis (HCC)   Esophageal varices (HCC)   Chronic anemia   Chronic venous stasis dermatitis of both lower extremities   Acute on chronic diastolic CHF (congestive heart failure) (HCC)   Acute hypoxemic respiratory failure (HCC)   Paroxysmal atrial fibrillation (HCC)   COPD with chronic bronchitis (HCC)   Bacteremia due to Klebsiella pneumoniae   Palliative care by specialist   DNR (do not resuscitate) discussion   Sepsis secondary to klebsiella pneumonia bacteremia:  Continue IV Ancef.  Acute on chronic diastolic CHF/anasarca: Continue IV Lasix infusion.  Monitor BMP, daily weights and urine output.  2D echo on 04/23/2020 showed EF estimated at 50 to  55%, mild LVH, grade 2 diastolic dysfunction.    Cardiology is following.   Liver cirrhosis with anasarca, history of esophageal varices: Continue diuretics, rifaximin and lactulose.  Elevated troponin: This is likely due to demand ischemia.  Patient has been evaluated by cardiologist.  AKI on CKD stage IV: Creatinine slightly improved today to 2.86 from 3.  AKI is likely secondary to cardiorenal syndrome as it is improving with diuresis.  Monitor BMP.    Nephrology is following.  Insulin-dependent diabetes mellitus, episode of hypoglycemia on 04/23/2020: NovoLog as needed for hyperglycemia  Hypertension: Continue antihypertensives   Hypokalemia: Improving.    Daily BMP to monitor, replace as needed.  Paroxysmal atrial fibrillation: Not on anticoagulation because of liver disease, kidney disease, thrombocytopenia and chronic anemia.  Monitor on telemetry.  Chronic venous stasis of bilateral lower extremities: Continue leg wraps   Goals of care: Consulted palliative care team because of multiple comorbidities including chronic nonhealing wounds, CHF, advanced kidney disease, liver cirrhosis with anasarca.     Morbid obesity: Body mass index is 41.52 kg/m.  Complicates overall care and prognosis  DVT prophylaxis: enoxaparin (LOVENOX) injection 40 mg Start: 04/24/20 1000   Diet:  Diet Orders (From admission, onward)    Start     Ordered   04/23/20 1934  Diet heart healthy/carb modified Room service appropriate? Yes; Fluid consistency: Thin  Diet effective now       Question Answer Comment  Diet-HS Snack? Nothing   Room service appropriate? Yes   Fluid consistency: Thin      04/23/20 1936  Code Status: Full Code    Subjective 04/27/20    Patient seen up in chair this morning.  He denies acute complaints including chest pain, shortness of breath, abdominal pain, nausea vomiting, fevers or chills.  He states that he wants his PCP from [facility] to come  and see him in the hospital.   Disposition Plan & Communication   Status is: Inpatient  Remains inpatient appropriate because:IV treatments appropriate due to intensity of illness or inability to take PO   Dispo: The patient is from: Home              Anticipated d/c is to: Home              Anticipated d/c date is: 3 days              Patient currently is not medically stable to d/c.        Family Communication: None at bedside, will attempt to call   Consults, Procedures, Significant Events   Consultants:   Cardiology  Nephrology  Procedures:   None  Antimicrobials:   Ancef   Objective   Vitals:   04/27/20 0727 04/27/20 1559 04/27/20 1735 04/27/20 2011  BP: (!) 142/58 (!) 138/49  (!) 130/49  Pulse: 73 68  73  Resp: 19 18 18 20   Temp: 98 F (36.7 C) 97.8 F (36.6 C)  98 F (36.7 C)  TempSrc: Oral Oral  Oral  SpO2: 99% 100%  100%  Weight:      Height:        Intake/Output Summary (Last 24 hours) at 04/27/2020 2054 Last data filed at 04/27/2020 1948 Gross per 24 hour  Intake 863.83 ml  Output 500 ml  Net 363.83 ml   Filed Weights   04/24/20 0308 04/26/20 0347 04/27/20 0455  Weight: (!) 137.9 kg (!) 133.4 kg (!) 131.3 kg    Physical Exam:  General exam: awake, alert, no acute distress, morbidly obese HEENT: moist mucus membranes, hearing grossly normal, wearing glasses Respiratory system: CTAB but diminished due to body habitus,normal respiratory effort. Cardiovascular system: normal S1/S2, RRR, cannot appreciate JVD due to body habitus, significant lower extremity edema proximal to the right leg wraps.   Central nervous system: A&O x3. no gross focal neurologic deficits, normal speech Extremities: moves all, bilateral lower extremities in wraps with proximal pitting edema Skin: dry, intact, normal temperature Psychiatry: normal mood, congruent affect  Labs   Data Reviewed: I have personally reviewed following labs and imaging  studies  CBC: Recent Labs  Lab 04/23/20 1506  WBC 9.6  NEUTROABS 8.8*  HGB 7.9*  HCT 24.5*  MCV 94.2  PLT 174*   Basic Metabolic Panel: Recent Labs  Lab 04/23/20 1506 04/24/20 0607 04/25/20 0503 04/26/20 0526 04/27/20 0256  NA 141 138 140 138 136  K 3.5 3.3* 3.1* 3.3* 3.4*  CL 104 104 103 101 97*  CO2 24 25 26 27 28   GLUCOSE 121* 154* 152* 155* 214*  BUN 63* 64* 73* 74* 78*  CREATININE 2.85* 3.03* 3.04* 3.14* 2.86*  CALCIUM 9.0 8.2* 8.1* 8.7* 8.5*  MG  --   --   --  1.7  --    GFR: Estimated Creatinine Clearance: 33.7 mL/min (A) (by C-G formula based on SCr of 2.86 mg/dL (H)). Liver Function Tests: Recent Labs  Lab 04/23/20 1506  AST 32  ALT 19  ALKPHOS 91  BILITOT 1.0  PROT 7.7  ALBUMIN 3.4*   No results for input(s):  LIPASE, AMYLASE in the last 168 hours. No results for input(s): AMMONIA in the last 168 hours. Coagulation Profile: Recent Labs  Lab 04/23/20 1506  INR 1.3*   Cardiac Enzymes: No results for input(s): CKTOTAL, CKMB, CKMBINDEX, TROPONINI in the last 168 hours. BNP (last 3 results) No results for input(s): PROBNP in the last 8760 hours. HbA1C: No results for input(s): HGBA1C in the last 72 hours. CBG: Recent Labs  Lab 04/26/20 2120 04/27/20 0724 04/27/20 1153 04/27/20 1613 04/27/20 2041  GLUCAP 239* 184* 235* 210* 200*   Lipid Profile: No results for input(s): CHOL, HDL, LDLCALC, TRIG, CHOLHDL, LDLDIRECT in the last 72 hours. Thyroid Function Tests: No results for input(s): TSH, T4TOTAL, FREET4, T3FREE, THYROIDAB in the last 72 hours. Anemia Panel: No results for input(s): VITAMINB12, FOLATE, FERRITIN, TIBC, IRON, RETICCTPCT in the last 72 hours. Sepsis Labs: Recent Labs  Lab 04/23/20 1506 04/23/20 1944 04/23/20 2142  LATICACIDVEN 3.0* 1.2 1.3    Recent Results (from the past 240 hour(s))  Blood Culture (routine x 2)     Status: None (Preliminary result)   Collection Time: 04/23/20  3:06 PM   Specimen: BLOOD  Result  Value Ref Range Status   Specimen Description BLOOD RAC  Final   Special Requests   Final    BOTTLES DRAWN AEROBIC AND ANAEROBIC Blood Culture results may not be optimal due to an inadequate volume of blood received in culture bottles   Culture   Final    NO GROWTH 4 DAYS Performed at [facility], [address] [place], [place] [address]    Report Status PENDING  Incomplete  Blood Culture (routine x 2)     Status: Abnormal   Collection Time: 04/23/20  3:06 PM   Specimen: BLOOD RIGHT FOREARM  Result Value Ref Range Status   Specimen Description   Final    BLOOD RIGHT FOREARM Performed at [facility], [place] [address], [place], [place] [address]    Special Requests   Final    BOTTLES DRAWN AEROBIC AND ANAEROBIC Blood Culture adequate volume Performed at [facility], [address] [place], [place] [address]    Culture  Setup Time   Final    GRAM NEGATIVE RODS IN BOTH AEROBIC AND ANAEROBIC BOTTLES CRITICAL RESULT CALLED TO, READ BACK BY AND VERIFIED WITH: SCOTT HALL 04/24/20 AT 0618 HS Performed at [facility], [address], [place], [place] [address]    Culture KLEBSIELLA PNEUMONIAE (A)  Final   Report Status 04/26/2020 FINAL  Final   Organism ID, Bacteria KLEBSIELLA PNEUMONIAE  Final      Susceptibility   Klebsiella pneumoniae - MIC*    AMPICILLIN >=32 RESISTANT Resistant     CEFAZOLIN <=4 SENSITIVE Sensitive     CEFEPIME <=0.12 SENSITIVE Sensitive     CEFTAZIDIME <=1 SENSITIVE Sensitive     CEFTRIAXONE <=0.25 SENSITIVE Sensitive     CIPROFLOXACIN <=0.25 SENSITIVE Sensitive     GENTAMICIN <=1 SENSITIVE Sensitive     IMIPENEM <=0.25 SENSITIVE Sensitive     TRIMETH/SULFA <=20 SENSITIVE Sensitive     AMPICILLIN/SULBACTAM >=32 RESISTANT Resistant     PIP/TAZO <=4 SENSITIVE Sensitive     * KLEBSIELLA PNEUMONIAE  Blood Culture ID Panel (Reflexed)     Status: Abnormal   Collection Time: 04/23/20  3:06 PM  Result Value Ref Range Status    Enterococcus species NOT DETECTED NOT DETECTED Final   Listeria monocytogenes NOT DETECTED NOT DETECTED Final   Staphylococcus  species NOT DETECTED NOT DETECTED Final   Staphylococcus aureus (BCID) NOT DETECTED NOT DETECTED Final   Streptococcus species NOT DETECTED NOT DETECTED Final   Streptococcus agalactiae NOT DETECTED NOT DETECTED Final   Streptococcus pneumoniae NOT DETECTED NOT DETECTED Final   Streptococcus pyogenes NOT DETECTED NOT DETECTED Final   Acinetobacter baumannii NOT DETECTED NOT DETECTED Final   Enterobacteriaceae species DETECTED (A) NOT DETECTED Final    Comment: Enterobacteriaceae represent a large family of gram-negative bacteria, not a single organism. CRITICAL RESULT CALLED TO, READ BACK BY AND VERIFIED WITH: SCOTT HALL 04/24/20 AT 0618 HS    Enterobacter cloacae complex NOT DETECTED NOT DETECTED Final   Escherichia coli NOT DETECTED NOT DETECTED Final   Klebsiella oxytoca NOT DETECTED NOT DETECTED Final   Klebsiella pneumoniae DETECTED (A) NOT DETECTED Final    Comment: CRITICAL RESULT CALLED TO, READ BACK BY AND VERIFIED WITH: SCOTT HALL 04/24/20 AT 0618 HS    Proteus species NOT DETECTED NOT DETECTED Final   Serratia marcescens NOT DETECTED NOT DETECTED Final   Carbapenem resistance NOT DETECTED NOT DETECTED Final   Haemophilus influenzae NOT DETECTED NOT DETECTED Final   Neisseria meningitidis NOT DETECTED NOT DETECTED Final   Pseudomonas aeruginosa NOT DETECTED NOT DETECTED Final   Candida albicans NOT DETECTED NOT DETECTED Final   Candida glabrata NOT DETECTED NOT DETECTED Final   Candida krusei NOT DETECTED NOT DETECTED Final   Candida parapsilosis NOT DETECTED NOT DETECTED Final   Candida tropicalis NOT DETECTED NOT DETECTED Final    Comment: Performed at [facility], [address] [place], [place] [address]  SARS Coronavirus 2 by RT PCR (hospital order, performed in [facility] hospital lab)     Status: None   Collection Time: 04/23/20   3:46 PM  Result Value Ref Range Status   SARS Coronavirus 2 NEGATIVE NEGATIVE Final    Comment: (NOTE) SARS-CoV-2 target nucleic acids are NOT DETECTED.  The SARS-CoV-2 RNA is generally detectable in upper and lower respiratory specimens during the acute phase of infection. The lowest concentration of SARS-CoV-2 viral copies this assay can detect is 250 copies / mL. A negative result does not preclude SARS-CoV-2 infection and should not be used as the sole basis for treatment or other patient management decisions.  A negative result may occur with improper specimen collection / handling, submission of specimen other than nasopharyngeal swab, presence of viral mutation(s) within the areas targeted by this assay, and inadequate number of viral copies (<250 copies / mL). A negative result must be combined with clinical observations, patient history, and epidemiological information.  Fact Sheet for Patients:   StrictlyIdeas.no  Fact Sheet for Healthcare Providers: BankingDealers.co.za  This test is not yet approved or  cleared by the [place] FDA and has been authorized for detection and/or diagnosis of SARS-CoV-2 by FDA under an Emergency Use Authorization (EUA).  This EUA will remain in effect (meaning this test can be used) for the duration of the COVID-19 declaration under Section 564(b)(1) of the Act, 21 U.S.C. section 360bbb-3(b)(1), unless the authorization is terminated or revoked sooner.  Performed at [facility], [address] [place], [place] [address]       Imaging Studies   No results found.   Medications   Scheduled Meds: . amLODipine  5 mg Oral Daily  . atorvastatin  20 mg Oral Daily  . enoxaparin (LOVENOX) injection  40 mg Subcutaneous Q12H  . ferrous sulfate  325 mg Oral Daily  .  hydrALAZINE  25 mg Oral BID  . insulin aspart  0-20 Units Subcutaneous TID WC  . insulin aspart  0-5 Units  Subcutaneous QHS  . lactulose  60 g Oral Daily  . losartan  25 mg Oral Daily  . pantoprazole  40 mg Oral BID  . potassium chloride  40 mEq Oral Daily  . rifaximin  550 mg Oral BID  . sodium chloride flush  3 mL Intravenous Q12H  . spironolactone  25 mg Oral Daily  . [START ON 04/28/2020] torsemide  40 mg Oral Daily   Continuous Infusions: . sodium chloride    .  ceFAZolin (ANCEF) IV Stopped (04/27/20 1512)       LOS: 4 days    Time spent: 30 minutes    Ezekiel Slocumb, DO [facility]  04/27/2020, 8:54 PM    If 7PM-7AM, please contact night-coverage. How to contact the [facility] Attending or Consulting provider [place] or covering provider during after hours [place], for this patient?    1. Check the care team in [facility] and look for a) attending/consulting TRH provider listed and b) the [facility] team listed 2. Log into [email or web address] and use [facility] universal password to access. If you do not have the password, please contact the hospital operator. 3. Locate the [facility] provider you are looking for under [facility] and page to a number that you can be directly reached. 4. If you still have difficulty reaching the provider, please page the [facility] (Director on Call) for the Hospitalists listed on amion for assistance.

## 2020-04-27 NOTE — TOC Initial Note (Signed)
Transition of Care [facility]) - Initial/Assessment Note    Patient Details  Name: Corey West MRN: [id number] Date of Birth: 1951/07/26  Transition of Care [facility]) CM/SW Contact:    Victorino Dike, RN Phone Number: 04/27/2020, 1:51 PM  Clinical Narrative:                  [place] [facility] and [facility].    Expected Discharge Plan: [place] Barriers to Discharge: Continued Medical Work up   Patient Goals and CMS Choice Patient states their goals for this hospitalization and ongoing recovery are:: to return home CMS Medicare.gov Compare Post Acute Care list provided to:: Patient Choice offered to / list presented to : Patient  Expected Discharge Plan and Services Expected Discharge Plan: [place]   Discharge Planning Services: CM Consult Post Acute Care Choice: [employer] arrangements for the past 2 months: Single Family Home                                      Prior Living Arrangements/Services Living arrangements for the past 2 months: Single Family Home Lives with:: Self Patient language and need for interpreter reviewed:: Yes Do you feel safe going back to the place where you live?: Yes      Need for Family Participation in Patient Care: No (Comment) Care giver support system in place?: No (comment) Current home services: Home PT, Home RN, DME Criminal Activity/Legal Involvement Pertinent to Current Situation/Hospitalization: No - Comment as needed  Activities of Daily Living Home Assistive Devices/Equipment: Cane (specify quad or straight) ADL Screening (condition at time of admission) Patient's cognitive ability adequate to safely complete daily activities?: Yes Is the patient deaf or have difficulty hearing?: No Does the patient have difficulty seeing, even when wearing glasses/contacts?: No Does the patient have difficulty concentrating, remembering, or making decisions?: No Patient able to  express need for assistance with ADLs?: Yes Does the patient have difficulty dressing or bathing?: Yes Independently performs ADLs?: No Does the patient have difficulty walking or climbing stairs?: Yes Weakness of Legs: Both Weakness of Arms/Hands: Both  Permission Sought/Granted                  Emotional Assessment Appearance:: Appears stated age     Orientation: : Oriented to Self, Oriented to Place, Oriented to  Time, Oriented to Situation Alcohol / Substance Use: Not Applicable Psych Involvement: No (comment)  Admission diagnosis:  Sepsis ([place]) [A41.9] Sepsis, due to unspecified organism, unspecified whether acute organ dysfunction present [facility]) [A41.9] Patient Active Problem List   Diagnosis Date Noted  . Bacteremia due to Klebsiella pneumoniae 04/26/2020  . Palliative care by specialist   . DNR (do not resuscitate) discussion   . COPD with chronic bronchitis ([place]) 04/23/2020  . Paroxysmal atrial fibrillation ([place]) 04/20/2020  . Cellulitis of left leg 02/23/2020  . Contusion of ribs, left, initial encounter 02/23/2020  . Acute hypoxemic respiratory failure ([place]) 02/04/2020  . Volume overload 01/28/2020  . Hyperlipidemia associated with type 2 diabetes mellitus ([place]) 01/28/2020  . Liver cirrhosis ([place])   . Anxiety   . SOB (shortness of breath)   . Elevated troponin   . CKD (chronic kidney disease) stage 4, GFR 15-29 ml/min (HCC)   . Unstageable pressure ulcer of sacral region ([place]) 11/28/2019  . Acute on chronic diastolic CHF (  congestive heart failure) ([place]) 11/27/2019  . Type II diabetes mellitus with renal manifestations ([place]) 11/27/2019  . Cellulitis of left lower extremity 11/27/2019  . Cellulitis of lower extremity 11/27/2019  . Cyst of pancreas 05/18/2019  . HCAP (healthcare-associated pneumonia) 02/03/2019  . Aphasia 01/14/2019  . Acute on chronic renal failure ([place]) 01/14/2019  . Malnutrition of moderate degree 12/03/2018  . C. difficile diarrhea  12/02/2018  . Ankle fracture, left, closed, initial encounter 10/15/2018  . Ankle fracture, right, closed, initial encounter 10/15/2018  . Ankle fracture 10/15/2018  . Pressure injury of skin 07/19/2018  . UTI (urinary tract infection) 07/18/2018  . Acute kidney injury superimposed on CKD ([place]) 01/02/2018  . 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 Goals of care, counseling/discussion   . Palliative care encounter   . Acute hepatic encephalopathy 12/16/2017  . Hepatic encephalopathy ([place]) 10/18/2017  . Hypokalemia 10/09/2017  . Hyperglycemia 10/09/2017  . Chronic anemia 06/26/2017  . Sepsis ([place]) 06/12/2017  . Type 2 diabetes mellitus with diabetic nephropathy, with long-term current use of insulin ([place]) 05/29/2017  . Long term (current) use of insulin ([place]) 05/29/2017  . Cellulitis in diabetic foot ([place]) 02/21/2017  . Cellulitis 02/21/2017  . GI bleed 07/20/2015  . Hematemesis 02/08/2015  . Cirrhosis ([place]) 02/08/2015  . Esophageal varices ([place]) 02/08/2015  . Gastroesophageal reflux disease 02/08/2015  . Hyperlipidemia 02/08/2015  . Chronic venous stasis dermatitis of both lower extremities 10/06/2014  . Cutaneous abscess of limb, unspecified 09/17/2014  . Edema 05/25/2014  . Hypertension 05/25/2014  . Obesity, Class III, BMI 40-49.9 (morbid obesity) ([place]) 05/25/2014  . Sleep apnea 05/25/2014  . Barrett's esophagus 05/25/2014  . Pancytopenia ([place]) 05/25/2014  . History of esophageal varices 05/25/2014  . Iron deficiency anemia 12/04/2013  . Thrombocytopenia ([place]) 12/04/2013  . Splenomegaly 11/04/2013   PCP:  Leonel Ramsay, MD Pharmacy:   [name], [place] [employer] [place] [address] Phone: [phone] Fax: [phone]     Social Determinants of Health (SDOH) Interventions    Readmission Risk Interventions Readmission Risk Prevention Plan 02/02/2020 12/15/2019 02/03/2019  Transportation Screening  Complete Complete Complete  PCP or Specialist Appt within 3-5 Days Complete - -  HRI or Home Care Consult Complete - -  Social Work Consult for [place] Planning/Counseling Complete - -  Palliative Care Screening Not Applicable - -  Medication Review Press photographer) Complete Complete Complete  PCP or Specialist appointment within 3-5 days of discharge - Complete -  [place] or [place] - Complete Complete  SW Recovery Care/Counseling Consult - - Not Complete  SW Consult Not Complete Comments - - NA  Palliative Care Screening - Not Applicable Not Applicable  Skilled Nursing Facility - Not Applicable Not Applicable  Some recent data might be hidden

## 2020-04-28 DIAGNOSIS — D649 Anemia, unspecified: Secondary | ICD-10-CM | POA: Diagnosis not present

## 2020-04-28 DIAGNOSIS — R188 Other ascites: Secondary | ICD-10-CM

## 2020-04-28 DIAGNOSIS — I5033 Acute on chronic diastolic (congestive) heart failure: Secondary | ICD-10-CM | POA: Diagnosis not present

## 2020-04-28 DIAGNOSIS — R7881 Bacteremia: Secondary | ICD-10-CM | POA: Diagnosis not present

## 2020-04-28 DIAGNOSIS — J9601 Acute respiratory failure with hypoxia: Secondary | ICD-10-CM | POA: Diagnosis not present

## 2020-04-28 LAB — IRON AND TIBC
Iron: 34 ug/dL — ABNORMAL LOW (ref 45–182)
Saturation Ratios: 12 % — ABNORMAL LOW (ref 17.9–39.5)
TIBC: 286 ug/dL (ref 250–450)
UIBC: 252 ug/dL

## 2020-04-28 LAB — CULTURE, BLOOD (ROUTINE X 2): Culture: NO GROWTH

## 2020-04-28 LAB — CBC
HCT: 21.9 % — ABNORMAL LOW (ref 39.0–52.0)
Hemoglobin: 7.4 g/dL — ABNORMAL LOW (ref 13.0–17.0)
MCH: 31 pg (ref 26.0–34.0)
MCHC: 33.8 g/dL (ref 30.0–36.0)
MCV: 91.6 fL (ref 80.0–100.0)
Platelets: 121 10*3/uL — ABNORMAL LOW (ref 150–400)
RBC: 2.39 MIL/uL — ABNORMAL LOW (ref 4.22–5.81)
RDW: 16.3 % — ABNORMAL HIGH (ref 11.5–15.5)
WBC: 4.7 10*3/uL (ref 4.0–10.5)
nRBC: 0 % (ref 0.0–0.2)

## 2020-04-28 LAB — BASIC METABOLIC PANEL
Anion gap: 9 (ref 5–15)
BUN: 73 mg/dL — ABNORMAL HIGH (ref 8–23)
CO2: 30 mmol/L (ref 22–32)
Calcium: 8.6 mg/dL — ABNORMAL LOW (ref 8.9–10.3)
Chloride: 98 mmol/L (ref 98–111)
Creatinine, Ser: 2.96 mg/dL — ABNORMAL HIGH (ref 0.61–1.24)
GFR calc Af Amer: 24 mL/min — ABNORMAL LOW (ref >60)
GFR calc non Af Amer: 21 mL/min — ABNORMAL LOW (ref >60)
Glucose, Bld: 182 mg/dL — ABNORMAL HIGH (ref 70–99)
Potassium: 3.6 mmol/L (ref 3.5–5.1)
Sodium: 137 mmol/L (ref 135–145)

## 2020-04-28 LAB — GLUCOSE, CAPILLARY
Glucose-Capillary: 170 mg/dL — ABNORMAL HIGH (ref 70–99)
Glucose-Capillary: 262 mg/dL — ABNORMAL HIGH (ref 70–99)
Glucose-Capillary: 223 mg/dL — ABNORMAL HIGH (ref 70–99)
Glucose-Capillary: 194 mg/dL — ABNORMAL HIGH (ref 70–99)

## 2020-04-28 LAB — FERRITIN: Ferritin: 35 ng/mL (ref 24–336)

## 2020-04-28 LAB — MAGNESIUM: Magnesium: 1.6 mg/dL — ABNORMAL LOW (ref 1.7–2.4)

## 2020-04-28 MED ORDER — METOLAZONE 5 MG PO TABS
5.0000 mg | ORAL_TABLET | Freq: Every day | ORAL | Status: DC
  Administered 2020-04-28: 5 mg via ORAL
  Filled 2020-04-28 (×2): qty 1

## 2020-04-28 MED ORDER — DARBEPOETIN ALFA 100 MCG/0.5ML IJ SOSY
100.0000 ug | PREFILLED_SYRINGE | INTRAMUSCULAR | Status: DC
  Filled 2020-04-28: qty 0.5

## 2020-04-28 MED ORDER — MAGNESIUM SULFATE 2 GM/50ML IV SOLN
2.0000 g | Freq: Once | INTRAVENOUS | Status: AC
  Administered 2020-04-28: 2 g via INTRAVENOUS
  Filled 2020-04-28: qty 50

## 2020-04-28 MED ORDER — HYDRALAZINE HCL 50 MG PO TABS
50.0000 mg | ORAL_TABLET | Freq: Two times a day (BID) | ORAL | Status: DC
  Administered 2020-04-28 – 2020-05-03 (×10): 50 mg via ORAL
  Filled 2020-04-28 (×10): qty 1

## 2020-04-28 NOTE — Progress Notes (Signed)
[facility], [place] 04/28/20  Subjective:   LOS: 5 07/28 0701 - 07/29 0700 In: 315.6 [I.V.:115.6; IV Piggyback:200] Out: 500 [Urine:500] Patient known to our practice from outpatient follow-up Admitted from home for worsening shortness of breath, productive cough and Worsening lower extremity edema  Overall doing fair Denies any acute complaints today Sitting up in the chair today, reports worsening of scrotal edema and lower extremity edema  Objective:  Vital signs in last 24 hours:  Temp:  [97.7 F (36.5 C)-98.2 F (36.8 C)] 98.2 F (36.8 C) (07/29 0815) Pulse Rate:  [68-79] 79 (07/29 0815) Resp:  [18-20] 18 (07/29 0815) BP: (128-138)/(49-68) 128/68 (07/29 0815) SpO2:  [98 %-100 %] 100 % (07/29 0815) Weight:  [130.9 kg] 130.9 kg (07/29 0408)  Weight change: -0.363 kg Filed Weights   04/26/20 0347 04/27/20 0455 04/28/20 0408  Weight: (!) 133.4 kg (!) 131.3 kg (!) 130.9 kg    Intake/Output:    Intake/Output Summary (Last 24 hours) at 04/28/2020 1122 Last data filed at 04/28/2020 0945 Gross per 24 hour  Intake 552.58 ml  Output 500 ml  Net 52.58 ml     Physical Exam: General:  Obese gentleman,   HEENT  moist oral mucous membranes  Pulm/lungs  normal breathing effort, decreased breath sounds at bases  CVS/Heart  no rub  Abdomen:   Soft, obese, nontender  Extremities:  2+ pitting edema, left leg wrapped    Neurologic:  Alert, able to answer questions appropriately  Skin:  Left leg and thigh dependent redness          Basic Metabolic Panel:  Recent Labs  Lab 04/24/20 0607 04/24/20 0607 04/25/20 0503 04/25/20 0503 04/26/20 0526 04/27/20 0256 04/28/20 0530  NA 138  --  140  --  138 136 137  K 3.3*  --  3.1*  --  3.3* 3.4* 3.6  CL 104  --  103  --  101 97* 98  CO2 25  --  26  --  27 28 30   GLUCOSE 154*  --  152*  --  155* 214* 182*  BUN 64*  --  73*  --  74* 78* 73*  CREATININE 3.03*  --  3.04*  --  3.14* 2.86* 2.96*   CALCIUM 8.2*   < > 8.1*   < > 8.7* 8.5* 8.6*  MG  --   --   --   --  1.7  --  1.6*   < > = values in this interval not displayed.     CBC: Recent Labs  Lab 04/23/20 1506 04/28/20 0530  WBC 9.6 4.7  NEUTROABS 8.8*  --   HGB 7.9* 7.4*  HCT 24.5* 21.9*  MCV 94.2 91.6  PLT 123* 121*      Lab Results  Component Value Date   HEPBSAG NON REACTIVE 01/30/2020   HEPBSAB NON REACTIVE 01/30/2020   HEPBIGM NON REACTIVE 01/30/2020      Microbiology:  Recent Results (from the past 240 hour(s))  Blood Culture (routine x 2)     Status: None   Collection Time: 04/23/20  3:06 PM   Specimen: BLOOD  Result Value Ref Range Status   Specimen Description BLOOD RAC  Final   Special Requests   Final    BOTTLES DRAWN AEROBIC AND ANAEROBIC Blood Culture results may not be optimal due to an inadequate volume of blood received in culture bottles   Culture   Final    NO GROWTH 5 DAYS  Performed at [facility], [address] [place], [place] [address]    Report Status 04/28/2020 FINAL  Final  Blood Culture (routine x 2)     Status: Abnormal   Collection Time: 04/23/20  3:06 PM   Specimen: BLOOD RIGHT FOREARM  Result Value Ref Range Status   Specimen Description   Final    BLOOD RIGHT FOREARM Performed at [facility], [place] [address], [place], [place] [address]    Special Requests   Final    BOTTLES DRAWN AEROBIC AND ANAEROBIC Blood Culture adequate volume Performed at [facility], [address] [place], [place] [address]    Culture  Setup Time   Final    GRAM NEGATIVE RODS IN BOTH AEROBIC AND ANAEROBIC BOTTLES CRITICAL RESULT CALLED TO, READ BACK BY AND VERIFIED WITH: SCOTT HALL 04/24/20 AT 0618 HS Performed at [facility], [address], [place], [place] [address]    Culture KLEBSIELLA PNEUMONIAE (A)  Final   Report Status 04/26/2020 FINAL  Final   Organism ID, Bacteria KLEBSIELLA PNEUMONIAE  Final      Susceptibility   Klebsiella pneumoniae  - MIC*    AMPICILLIN >=32 RESISTANT Resistant     CEFAZOLIN <=4 SENSITIVE Sensitive     CEFEPIME <=0.12 SENSITIVE Sensitive     CEFTAZIDIME <=1 SENSITIVE Sensitive     CEFTRIAXONE <=0.25 SENSITIVE Sensitive     CIPROFLOXACIN <=0.25 SENSITIVE Sensitive     GENTAMICIN <=1 SENSITIVE Sensitive     IMIPENEM <=0.25 SENSITIVE Sensitive     TRIMETH/SULFA <=20 SENSITIVE Sensitive     AMPICILLIN/SULBACTAM >=32 RESISTANT Resistant     PIP/TAZO <=4 SENSITIVE Sensitive     * KLEBSIELLA PNEUMONIAE  Blood Culture ID Panel (Reflexed)     Status: Abnormal   Collection Time: 04/23/20  3:06 PM  Result Value Ref Range Status   Enterococcus species NOT DETECTED NOT DETECTED Final   Listeria monocytogenes NOT DETECTED NOT DETECTED Final   Staphylococcus species NOT DETECTED NOT DETECTED Final   Staphylococcus aureus (BCID) NOT DETECTED NOT DETECTED Final   Streptococcus species NOT DETECTED NOT DETECTED Final   Streptococcus agalactiae NOT DETECTED NOT DETECTED Final   Streptococcus pneumoniae NOT DETECTED NOT DETECTED Final   Streptococcus pyogenes NOT DETECTED NOT DETECTED Final   Acinetobacter baumannii NOT DETECTED NOT DETECTED Final   Enterobacteriaceae species DETECTED (A) NOT DETECTED Final    Comment: Enterobacteriaceae represent a large family of gram-negative bacteria, not a single organism. CRITICAL RESULT CALLED TO, READ BACK BY AND VERIFIED WITH: SCOTT HALL 04/24/20 AT 0618 HS    Enterobacter cloacae complex NOT DETECTED NOT DETECTED Final   Escherichia coli NOT DETECTED NOT DETECTED Final   Klebsiella oxytoca NOT DETECTED NOT DETECTED Final   Klebsiella pneumoniae DETECTED (A) NOT DETECTED Final    Comment: CRITICAL RESULT CALLED TO, READ BACK BY AND VERIFIED WITH: SCOTT HALL 04/24/20 AT 0618 HS    Proteus species NOT DETECTED NOT DETECTED Final   Serratia marcescens NOT DETECTED NOT DETECTED Final   Carbapenem resistance NOT DETECTED NOT DETECTED Final   Haemophilus influenzae NOT  DETECTED NOT DETECTED Final   Neisseria meningitidis NOT DETECTED NOT DETECTED Final   Pseudomonas aeruginosa NOT DETECTED NOT DETECTED Final   Candida albicans NOT DETECTED NOT DETECTED Final   Candida glabrata NOT DETECTED NOT DETECTED Final   Candida krusei NOT DETECTED NOT DETECTED Final   Candida parapsilosis NOT DETECTED NOT DETECTED Final   Candida tropicalis NOT DETECTED  NOT DETECTED Final    Comment: Performed at [facility], [address] [place], [place] [address]  SARS Coronavirus 2 by RT PCR (hospital order, performed in [place] hospital lab)     Status: None   Collection Time: 04/23/20  3:46 PM  Result Value Ref Range Status   SARS Coronavirus 2 NEGATIVE NEGATIVE Final    Comment: (NOTE) SARS-CoV-2 target nucleic acids are NOT DETECTED.  The SARS-CoV-2 RNA is generally detectable in upper and lower respiratory specimens during the acute phase of infection. The lowest concentration of SARS-CoV-2 viral copies this assay can detect is 250 copies / mL. A negative result does not preclude SARS-CoV-2 infection and should not be used as the sole basis for treatment or other patient management decisions.  A negative result may occur with improper specimen collection / handling, submission of specimen other than nasopharyngeal swab, presence of viral mutation(s) within the areas targeted by this assay, and inadequate number of viral copies (<250 copies / mL). A negative result must be combined with clinical observations, patient history, and epidemiological information.  Fact Sheet for Patients:   StrictlyIdeas.no  Fact Sheet for Healthcare Providers: BankingDealers.co.za  This test is not yet approved or  cleared by the [place] FDA and has been authorized for detection and/or diagnosis of SARS-CoV-2 by FDA under an Emergency Use Authorization (EUA).  This EUA will remain in effect (meaning this test can be  used) for the duration of the COVID-19 declaration under Section 564(b)(1) of the Act, 21 U.S.C. section 360bbb-3(b)(1), unless the authorization is terminated or revoked sooner.  Performed at [facility], [address] [place], [place] [address]     Coagulation Studies: No results for input(s): LABPROT, INR in the last 72 hours.  Urinalysis: No results for input(s): COLORURINE, LABSPEC, PHURINE, GLUCOSEU, HGBUR, BILIRUBINUR, KETONESUR, PROTEINUR, UROBILINOGEN, NITRITE, LEUKOCYTESUR in the last 72 hours.  Invalid input(s): APPERANCEUR    Imaging: No results found.   Medications:   . sodium chloride    .  ceFAZolin (ANCEF) IV 2 g (04/28/20 0547)   . atorvastatin  20 mg Oral Daily  . [START ON 05/03/2020] darbepoetin (ARANESP) injection - NON-DIALYSIS  100 mcg Subcutaneous Q Tue-1800  . enoxaparin (LOVENOX) injection  40 mg Subcutaneous Q12H  . ferrous sulfate  325 mg Oral Daily  . hydrALAZINE  50 mg Oral BID  . insulin aspart  0-20 Units Subcutaneous TID WC  . insulin aspart  0-5 Units Subcutaneous QHS  . lactulose  60 g Oral Daily  . losartan  25 mg Oral Daily  . pantoprazole  40 mg Oral BID  . potassium chloride  40 mEq Oral Daily  . rifaximin  550 mg Oral BID  . sodium chloride flush  3 mL Intravenous Q12H  . spironolactone  25 mg Oral Daily  . torsemide  40 mg Oral Daily   sodium chloride, acetaminophen, benzonatate, ipratropium-albuterol, LORazepam, ondansetron (ZOFRAN) IV, oxyCODONE, phenol, sodium chloride, sodium chloride flush, traMADol  Assessment/ Plan:  69 y.o. male with   Principal Problem:   Sepsis ([place]) Active Problems:   Cirrhosis ([place])   Esophageal varices ([place])   Chronic anemia   Chronic venous stasis dermatitis of both lower extremities   Obesity, Class III, BMI 40-49.9 (morbid obesity) ([place])   Type 2 diabetes mellitus with diabetic nephropathy, with long-term current use of insulin (HCC)   Acute on chronic diastolic CHF  (congestive heart failure) (HCC)   CKD (chronic kidney disease) stage  4, GFR 15-29 ml/min (HCC)   Acute hypoxemic respiratory failure (HCC)   Paroxysmal atrial fibrillation (HCC)   COPD with chronic bronchitis (HCC)   Bacteremia due to Klebsiella pneumoniae   Palliative care by specialist   DNR (do not resuscitate) discussion   #.  Acute kidney injury on CKD st 4 Recent Labs    04/25/20 0503 04/26/20 0526 04/27/20 0256 04/28/20 0530  CREATININE 3.04* 3.14* 2.86* 2.96*  AKI is likely secondary to abnormal cardiorenal hemodynamics in setting of liver cirrhosis.  Underlying CKD likely secondary to diabetes. CT abdomen without contrast on July 24 confirmed cirrhosis, portal venous hypertension, cholelithiasis, aortic atherosclerosis, three-vessel coronary disease, aortic valve calcifications, no hydronephrosis Patient is nonoliguric Serum creatinine is stabilizing and has started to trend down today. No acute indication for dialysis at present  #. Anemia of CKD  Lab Results  Component Value Date   HGB 7.4 (L) 04/28/2020  Discussed ESA therapy with patient.  Patient has been evaluated by Dr. Grayland Ormond as outpatient who recommended IV iron and ESA therapy.  He has also received blood transfusion as outpatient We will start Aranesp weekly while inpatient.  #. Diabetes type 2 with CKD Hemoglobin A1C (%)  Date Value  05/10/2014 11.0 (H)   Hgb A1c MFr Bld (%)  Date Value  02/23/2020 6.9 (H)   #Cirrhosis with volume overload Diuretic oral torsemide. Add Metolazone Responding fairly. Continue to monitor renal function closely   #Hypokalemia Due to aggressive diuresis. Replace orally as necessary.    LOS: [place] 7/29/202111:22 AM  [facility], [place]

## 2020-04-28 NOTE — Evaluation (Signed)
Physical Therapy Evaluation Patient Details Name: Corey West MRN: [id number] DOB: 13-Dec-1950 Today's Date: 04/28/2020   History of Present Illness  69 y.o. male who came in with weakness and shortness of breath.  Medical history significant of morbid obesity, chronic lymphedema and venous stasis, recurrent UTIs.  Type 2 diabetes, CKD-4, liver cirrhosis, hx of GI bleeds, chronic iron deficiency anemia, GERD, likely OSA, chronic thrombocytopenia, HTN, HLD.  Chronic weeping and swelling of his legs.  Clinical Impression  Pt did relatively well with PT exam and though he may not be back to baseline he did show good enough mobility to safely go home.  He did need some minimal cuing/gait training to insure safe use of walker and to improve cadence, especially due to b/l LE swelling.  Pt will be able to return home alone safely, but will benefit from HHPT to work back to prior level of function.    Follow Up Recommendations Home health PT    Equipment Recommendations  None recommended by PT    Recommendations for Other Services       Precautions / Restrictions Precautions Precautions: Fall Restrictions Weight Bearing Restrictions: No      Mobility  Bed Mobility Overal bed mobility: Modified Independent             General bed mobility comments: in recliner on arrival, states he got out of bed earlier w/o assist  Transfers Overall transfer level: Modified independent Equipment used: Rolling walker (2 wheeled)             General transfer comment: Pt was able to rise to standing w/o hesitation, good stability with minimal UE reliance  Ambulation/Gait Ambulation/Gait assistance: Supervision Gait Distance (Feet): 200 Feet Assistive device: Rolling walker (2 wheeled)       General Gait Details: Pt reports that walker is narrower than his at home so he doesn't like it, but was able to ambulate around the nurses's station with consistent and safe ambulation.    Stairs             Wheelchair Mobility    Modified Rankin (Stroke Patients Only)       Balance Overall balance assessment: Modified Independent                                           Pertinent Vitals/Pain Pain Assessment: 0-10 Pain Score: 3  Pain Location: c/o back and hip pain from being in the uncomfortable bed    Home Living Family/patient expects to be discharged to:: Private residence Living Arrangements: Alone Available Help at Discharge: Friend(s);Available PRN/intermittently Type of Home:  (Condo) Home Access: Stairs to enter Entrance Stairs-Rails: Right Entrance Stairs-Number of Steps: 3 Home Layout: One level Home Equipment: Walker - 2 wheels;Cane - single point Additional Comments: bari walker    Prior Function Level of Independence: Independent with assistive device(s);Needs assistance   Gait / Transfers Assistance Needed: MOD I for fxl mobility with HH amb with RW, w/c for MD appts  ADL's / Homemaking Assistance Needed: Reports that he is Indep with BADLs and does not use shower, "bird baths". Does have [facility] nurse come in to dress LE wounds. Reports being Indep with meal prep and med mgt. Drives himself to appts        Hand Dominance        Extremity/Trunk Assessment   Upper Extremity Assessment Upper Extremity  Assessment: Generalized weakness    Lower Extremity Assessment Lower Extremity Assessment: Generalized weakness       Communication   Communication: No difficulties  Cognition Arousal/Alertness: Awake/alert Behavior During Therapy: WFL for tasks assessed/performed Overall Cognitive Status: Within Functional Limits for tasks assessed                                        General Comments General comments (skin integrity, edema, etc.): Pt did relatively well with ambulation, but reports still feeling weaker and more limited than his basleine    Exercises     Assessment/Plan    PT Assessment Patient  needs continued PT services  PT Problem List Decreased strength;Decreased range of motion;Decreased activity tolerance;Decreased balance;Decreased mobility;Decreased coordination;Decreased knowledge of use of DME;Decreased safety awareness;Pain       PT Treatment Interventions DME instruction;Gait training;Stair training;Therapeutic activities;Therapeutic exercise;Functional mobility training;Balance training;Neuromuscular re-education;Patient/family education    PT Goals (Current goals can be found in the Care Plan section)  Acute Rehab PT Goals Patient Stated Goal: go home PT Goal Formulation: With patient Time For Goal Achievement: 05/12/20 Potential to Achieve Goals: Good    Frequency Min 2X/week   Barriers to discharge        Co-evaluation               AM-PAC PT "6 Clicks" Mobility  Outcome Measure Help needed turning from your back to your side while in a flat bed without using bedrails?: None Help needed moving from lying on your back to sitting on the side of a flat bed without using bedrails?: None Help needed moving to and from a bed to a chair (including a wheelchair)?: None Help needed standing up from a chair using your arms (e.g., wheelchair or bedside chair)?: None Help needed to walk in hospital room?: A Little Help needed climbing 3-5 steps with a railing? : A Little 6 Click Score: 22    End of Session Equipment Utilized During Treatment: Gait belt Activity Tolerance: Patient tolerated treatment well Patient left: with call bell/phone within reach;with nursing/sitter in room;in chair Nurse Communication: Mobility status PT Visit Diagnosis: Muscle weakness (generalized) (M62.81);Unsteadiness on feet (R26.81)    Time: 3005-1102 PT Time Calculation (min) (ACUTE ONLY): 24 min   Charges:   PT Evaluation $PT Eval Low Complexity: 1 Low PT Treatments $Gait Training: 8-22 mins        Kreg Shropshire, DPT 04/28/2020, 3:38 PM

## 2020-04-28 NOTE — Evaluation (Signed)
Occupational Therapy Evaluation Patient Details Name: Corey West MRN: [id number] DOB: March 15, 1951 Today's Date: 04/28/2020    History of Present Illness 69 y.o. male who came in with weakness and shortness of breath.  Medical history significant of morbid obesity, chronic lymphedema and venous stasis, recurrent UTIs.  Type 2 diabetes, CKD-4, liver cirrhosis, hx of GI bleeds, chronic iron deficiency anemia, GERD, likely OSA, chronic thrombocytopenia, HTN, HLD.  Chronic weeping and swelling of his legs.   Clinical Impression   Mr. Joubert was seen for OT evaluation this date. Prior to hospital admission, pt was modified independent for ADL management, using a 2WW for functional mobility, and sponge bathing at his sink. Pt lives alone in a 1 level condo with 4 STE, and endorses 1 fall in the past year. Currently pt demonstrates impairments as described below (See OT problem list) which functionally limit his ability to perform ADL/self-care tasks. Pt currently requires supervision for safety during functional mobility/toilet transfer, MOD A for peri-care management after toileting, and MAX A to don socks this date. Vitals remain WFL t/o session, and pt denies adverse s/s t/o session. Pt would benefit from skilled OT services to address noted impairments and functional limitations (see below for any additional details) in order to maximize safety and independence while minimizing falls risk and caregiver burden. Upon hospital discharge, recommend HHOT to maximize pt safety and return to functional independence during meaningful occupations of daily life.      Follow Up Recommendations  Home health OT    Equipment Recommendations  3 in 1 bedside commode    Recommendations for Other Services       Precautions / Restrictions Precautions Precautions: Fall Restrictions Weight Bearing Restrictions: No      Mobility Bed Mobility Overal bed mobility: Modified Independent             General  bed mobility comments: Pt comes to sitting at EOB with increased time/effort to perform. Independently adjusts HOB to elevated position.  Transfers Overall transfer level: Needs assistance Equipment used: Rolling walker (2 wheeled) Transfers: Sit to/from Stand Sit to Stand: Supervision         General transfer comment: Pt performs STS/functional mobility w/ supervision for safety.    Balance Overall balance assessment: Mild deficits observed, not formally tested;History of Falls                                         ADL either performed or assessed with clinical judgement   ADL Overall ADL's : Needs assistance/impaired                                       General ADL Comments: Pt functionally limited by generalized weakness & decreased activity tolerance, and pain this date. He requires min cueing t/o activities for safety/safe use of AE. Supervision for safety with functional mobility/toilet transfer, MOD A for peri-care for thoroughness, MAX A to don bilat socks at bed-level.     Vision Baseline Vision/History: Wears glasses Patient Visual Report: No change from baseline       Perception     Praxis      Pertinent Vitals/Pain Pain Assessment: Faces Pain Score: 3  Faces Pain Scale: Hurts little more Pain Location: back pain, arm pain (IV noted to be infultrated RN notified)  Pain Descriptors / Indicators: Aching;Grimacing;Sore Pain Intervention(s): Limited activity within patient's tolerance;Monitored during session;Repositioned (RN notified IV infultrated, in room to assess during session.)     Hand Dominance Right   Extremity/Trunk Assessment Upper Extremity Assessment Upper Extremity Assessment: Generalized weakness   Lower Extremity Assessment Lower Extremity Assessment: Generalized weakness       Communication Communication Communication: No difficulties   Cognition Arousal/Alertness: Awake/alert Behavior During  Therapy: WFL for tasks assessed/performed Overall Cognitive Status: Within Functional Limits for tasks assessed                                 General Comments: Hyperverbal, requires occasional re-direction to task/topic at hand.   General Comments  Pt did relatively well with ambulation, but reports still feeling weaker and more limited than his basleine    Exercises Other Exercises Other Exercises: Pt educated on role of OT in acute setting vs Home Health capacity, safe use of AE/DME for ADL management, falls prevention strategies, and routines modifications to support safety and functional indep upon hospital DC. Other Exercises: OT facilitates functional mobility, LB dressing, and toileting tasks this date. See ADL section for additional detail.   Shoulder Instructions      Home Living Family/patient expects to be discharged to:: Private residence Living Arrangements: Alone Available Help at Discharge: Friend(s);Available PRN/intermittently Type of Home:  (Condo) Home Access: Stairs to enter [employer] of Steps: 3 Entrance Stairs-Rails: Right Home Layout: One level     Bathroom Shower/Tub: Tub/shower unit (Sponge bathes)   Bathroom Toilet: Handicapped height     Home Equipment: Environmental consultant - 2 wheels;Cane - single point   Additional Comments: bari walker      Prior Functioning/Environment Level of Independence: Independent with assistive device(s);Needs assistance  Gait / Transfers Assistance Needed: MOD I for fxl mobility with HH amb with RW, w/c for MD appts ADL's / Homemaking Assistance Needed: Reports that he is Indep with BADLs and does not use shower, sponge bathes at sink. Does have [facility] nurse come in to dress LE wounds. Reports being Indep with meal prep and med mgt. Drives himself to appts            OT Problem List: Decreased strength;Decreased coordination;Pain;Decreased activity tolerance;Decreased safety awareness;Increased  edema;Impaired balance (sitting and/or standing);Decreased knowledge of use of DME or AE      OT Treatment/Interventions: Self-care/ADL training;Therapeutic exercise;Therapeutic activities;DME and/or AE instruction;Patient/family education;Balance training;Energy conservation    OT Goals(Current goals can be found in the care plan section) Acute Rehab OT Goals Patient Stated Goal: go home OT Goal Formulation: With patient Time For Goal Achievement: 05/12/20 Potential to Achieve Goals: Good ADL Goals Pt Will Transfer to Toilet: with modified independence;bedside commode;ambulating (c LRAD PRN for improved safety and functional indep.) Pt Will Perform Toileting - Clothing Manipulation and hygiene: sit to/from stand;with modified independence;with adaptive equipment (c LRAD PRN for improved safety and functional indep.) Additional ADL Goal #1: Pt will independently verbalize a plan to implement at least 3 learned falls prevention strategies into his daily routines/home environment for improved safety and functional indep. upon hospital DC.  OT Frequency: Min 2X/week   Barriers to D/C:            Co-evaluation              AM-PAC OT "6 Clicks" Daily Activity     Outcome Measure Help from another person eating meals?: None Help from another  person taking care of personal grooming?: A Little Help from another person toileting, which includes using toliet, bedpan, or urinal?: A Lot Help from another person bathing (including washing, rinsing, drying)?: A Little Help from another person to put on and taking off regular upper body clothing?: None Help from another person to put on and taking off regular lower body clothing?: A Lot 6 Click Score: 18   End of Session Equipment Utilized During Treatment: Gait belt;Rolling walker Nurse Communication: Mobility status;Other (comment) (IV infiltrated, pt refuses chair alarm, educated on use of call bell)  Activity Tolerance: Patient  tolerated treatment well Patient left: in chair;with call bell/phone within reach (Pt declines chair alarm. Safety education provided. RN notified.)  OT Visit Diagnosis: Other abnormalities of gait and mobility (R26.89);Muscle weakness (generalized) (M62.81)                Time: 3267-1245 OT Time Calculation (min): 28 min Charges:  OT General Charges $OT Visit: 1 Visit OT Evaluation $OT Eval Moderate Complexity: 1 Mod OT Treatments $Self Care/Home Management : 23-37 mins  Shara Blazing, M.S., OTR/L Ascom: [phone] 04/28/20, 4:40 PM

## 2020-04-28 NOTE — Progress Notes (Addendum)
Progress Note  Patient Name: Corey West Date of Encounter: 04/28/2020  Primary Cardiologist: Nelva Bush, MD  Subjective   No real change.  Tired of being in bed this AM. Can't get comfortable.  No chest pain or dyspnea.  Wants to go home but still feels volume overloaded.  Inpatient Medications    Scheduled Meds: . amLODipine  5 mg Oral Daily  . atorvastatin  20 mg Oral Daily  . enoxaparin (LOVENOX) injection  40 mg Subcutaneous Q12H  . ferrous sulfate  325 mg Oral Daily  . hydrALAZINE  25 mg Oral BID  . insulin aspart  0-20 Units Subcutaneous TID WC  . insulin aspart  0-5 Units Subcutaneous QHS  . lactulose  60 g Oral Daily  . losartan  25 mg Oral Daily  . pantoprazole  40 mg Oral BID  . potassium chloride  40 mEq Oral Daily  . rifaximin  550 mg Oral BID  . sodium chloride flush  3 mL Intravenous Q12H  . spironolactone  25 mg Oral Daily  . torsemide  40 mg Oral Daily   Continuous Infusions: . sodium chloride    .  ceFAZolin (ANCEF) IV 2 g (04/28/20 0547)   PRN Meds: sodium chloride, acetaminophen, benzonatate, ipratropium-albuterol, LORazepam, ondansetron (ZOFRAN) IV, oxyCODONE, phenol, sodium chloride, sodium chloride flush, traMADol   Vital Signs    Vitals:   04/27/20 1735 04/27/20 2011 04/28/20 0408 04/28/20 0815  BP:  (!) 130/49 (!) 133/53 128/68  Pulse:  73 70 79  Resp: 18 20 20 18   Temp:  98 F (36.7 C) 97.7 F (36.5 C) 98.2 F (36.8 C)  TempSrc:  Oral Oral   SpO2:  100% 98% 100%  Weight:   (!) 130.9 kg   Height:        Intake/Output Summary (Last 24 hours) at 04/28/2020 0841 Last data filed at 04/27/2020 1948 Gross per 24 hour  Intake 315.58 ml  Output 500 ml  Net -184.42 ml   Filed Weights   04/26/20 0347 04/27/20 0455 04/28/20 0408  Weight: (!) 133.4 kg (!) 131.3 kg (!) 130.9 kg    Physical Exam   GEN: Obese, no acute distress.  HEENT: Grossly normal.  Neck: Supple, obese, difficult to gauge JVP.  Bilat carotid bruits vs radiated  murmur.  No masses. Cardiac: RRR, 3/6 SEM @ upper sternal borders, no rubs, or gallops. No clubbing, cyanosis, 2+ bilat LE edema noted above wraps - involving posterior thighs.  Radials/DP/PT 2+ and equal bilaterally.  Respiratory:  Respirations regular and unlabored, bibasilar crackles. GI: Obese, softer than yesterday, diffusely tender.  BS + x 4.  Flank edema noted. MS: no deformity or atrophy. Skin: warm and dry, no rash.  Lower legs wrapped.  Mild erythema noted to bilat posterior thighs. Neuro:  Strength and sensation are intact. Psych: AAOx3.  Normal affect.  Labs    Chemistry Recent Labs  Lab 04/23/20 1506 04/24/20 0607 04/26/20 0526 04/27/20 0256 04/28/20 0530  NA 141   < > 138 136 137  K 3.5   < > 3.3* 3.4* 3.6  CL 104   < > 101 97* 98  CO2 24   < > 27 28 30   GLUCOSE 121*   < > 155* 214* 182*  BUN 63*   < > 74* 78* 73*  CREATININE 2.85*   < > 3.14* 2.86* 2.96*  CALCIUM 9.0   < > 8.7* 8.5* 8.6*  PROT 7.7  --   --   --   --  ALBUMIN 3.4*  --   --   --   --   AST 32  --   --   --   --   ALT 19  --   --   --   --   ALKPHOS 91  --   --   --   --   BILITOT 1.0  --   --   --   --   GFRNONAA 22*   < > 19* 22* 21*  GFRAA 25*   < > 22* 25* 24*  ANIONGAP 13   < > 10 11 9    < > = values in this interval not displayed.     Hematology Recent Labs  Lab 04/23/20 1506 04/28/20 0530  WBC 9.6 4.7  RBC 2.60* 2.39*  HGB 7.9* 7.4*  HCT 24.5* 21.9*  MCV 94.2 91.6  MCH 30.4 31.0  MCHC 32.2 33.8  RDW 17.4* 16.3*  PLT 123* 121*    Cardiac Enzymes  Recent Labs  Lab 04/23/20 1525 04/23/20 1854  TROPONINIHS 18* 578*      BNP Recent Labs  Lab 04/23/20 1525  BNP 376.4*     Lipids  Lab Results  Component Value Date   CHOL 141 12/15/2019   HDL 37 (L) 12/15/2019   LDLCALC 93 12/15/2019   TRIG 56 12/15/2019   CHOLHDL 3.8 12/15/2019    HbA1c  Lab Results  Component Value Date   HGBA1C 6.9 (H) 02/23/2020    Radiology   ---  Telemetry    Sinus rhythm,  1st deg avb, rare pvcs - Personally Reviewed  Cardiac Studies   2D echo 04/24/2020: 1. Left ventricular ejection fraction, by estimation, is 50 to 55%. The  left ventricle has low normal function. The left ventricle has no regional  wall motion abnormalities. The left ventricular internal cavity size was  mildly dilated. There is mild  left ventricular hypertrophy. Left ventricular diastolic parameters are  consistent with Grade II diastolic dysfunction (pseudonormalization).  2. Right ventricular systolic function is normal. The right ventricular  size is normal. There is mildly elevated pulmonary artery systolic  pressure.  3. Left atrial size was moderately dilated.  4. Right atrial size was mildly dilated.  5. The mitral valve is normal in structure. Trivial mitral valve  regurgitation. No evidence of mitral stenosis.  6. The aortic valve is abnormal. Aortic valve regurgitation is not  visualized. Mild to moderate aortic valve stenosis. Aortic valve area, by  VTI measures 1.36 cm. Aortic valve mean gradient measures 14.3 mmHg.  7. The inferior vena cava is dilated in size with <50% respiratory  variability, suggesting right atrial pressure of 15 mmHg.  Patient Profile     69 y.o.malewith history of HFpEF, permanent Afib not on [place], CKD stage IV, cirrhosis complicated by portal hypertension, DM2, CKD stage IV, HTN, HLD, chronic anemia, obesity, and OSA not on CPAP, who was admitted 7/24 due to worsening volume overload in the setting of HFpEF.   Assessment & Plan    1.  Acute on chronic HF w/ preserved EF:  Ef 50-55% w/ gr 2 DD by echo this admission.  Admitted 7/24 after failing outpt diuretics.  Admitted 7/24 after failing outpt diuretics.  I/O not entirely accurate in the setting of incontinence, though listed as minus 161 ml yesterday and minus 5.7 L for admission.  Wt down further to 130.9 kg this AM.  Was 128.5 kg @ onc visit on 6/15.  Creat up slightly to  2.96.  Bicarb  up sl to 30.  He remains volume overloaded on exam w/ significant lower ext and abd edema.  Lasix gtt d/c'd yesterday and now on torsemide 40mg  daily.  Given high risk of readmission, I think we should push to get him as dry as possible.  Likely needs additional IV diuresis.  Defer to nephrology.  HR/BP stable. Cont ARB, hydral, spiro, amlodipine.  2.  PAF: maintaining sinus.  No OAC 2/2 portal HTN/anemia (CHA2DS2VASc = 5).  No AVN blocking agent 2/2 wide 1st deg avb.  3.  Elevated HsTroponin:  Mildly elevated in the setting of sepsis, CHF, and CKD.  Echo w/ nl EF and w/o wma's.  Likely represents demand ischemia.  No plan for ischemic eval at this time.  Cont statin rx.  No ASA 2/2 anemia/portal HTN.  4.  Sepsis w/ Klebsiella pneumoniae bacteremia:  1/2 bottles.  Abx per IM.  5.  CKD IV:  Relatively stable.  See #1.  Nephrology following.  6.  Hypokalemia:  K 3.6 this AM.  On spiro and KCl.  7.  Normocytic anemia:  H/H down sl to 7.4/21.9.  Nephrology considering epogen.   Signed, Murray Hodgkins, NP  04/28/2020, 8:41 AM    For questions or updates, please contact   Please consult [email or web address] for contact info under Cardiology/STEMI.

## 2020-04-28 NOTE — Progress Notes (Addendum)
PROGRESS NOTE    Corey West   MMN:[id number]  DOB: 1951/06/20  PCP: Mick Sell, MD    DOA: 04/23/2020 LOS: 5   Brief Narrative   Corey West is a 69 y.o. 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  He has also been having diarrhea this because he takes lactulose for his "ammonia".   He was febrile (101.8), tachycardic lactic acid was elevated at 3.0.  He was admitted to the hospital for sepsis and acute on chronic diastolic CHF with fluid overload.     Assessment & Plan   Principal Problem:   Sepsis (HCC) Active Problems:   Type 2 diabetes mellitus with diabetic nephropathy, with long-term current use of insulin (HCC)   CKD (chronic kidney disease) stage 4, GFR 15-29 ml/min (HCC)   Obesity, Class III, BMI 40-49.9 (morbid obesity) (HCC)   Cirrhosis (HCC)   Esophageal varices (HCC)   Chronic anemia   Chronic venous stasis dermatitis of both lower extremities   Acute on chronic diastolic CHF (congestive heart failure) (HCC)   Acute hypoxemic respiratory failure (HCC)   Paroxysmal atrial fibrillation (HCC)   COPD with chronic bronchitis (HCC)   Bacteremia due to Klebsiella pneumoniae   Palliative care by specialist   DNR (do not resuscitate) discussion   Sepsis secondary to klebsiella pneumonia bacteremia:  Continue IV Ancef.  Day 6 of antibiotics.  Acute on chronic diastolic CHF/anasarca: Still remains volume overloaded with edema up both thighs.  Lasix infusion stopped yesterday due to  renal function, started on PO torsemide today.   Monitor BMP, daily weights and urine output.  2D echo on 04/23/2020 showed EF estimated at 50 to 55%, mild LVH, grade 2 diastolic dysfunction.   Cardiology and nephrology following.  Will need very close follow up in HF clinic or cardiologist.  Diuresis per consultants.  Liver cirrhosis with anasarca, history of esophageal varices: Continue diuretics, rifaximin and lactulose.  Elevated troponin: This is likely due to demand ischemia.  Patient has been evaluated by cardiologist.  AKI on CKD stage IV: Creatinine slightly improved today to 2.86 from 3.  AKI is likely secondary to cardiorenal syndrome as it is improving with diuresis.  Monitor BMP.    Nephrology is following.  Insulin-dependent diabetes mellitus, episode of hypoglycemia on 04/23/2020: NovoLog as needed for hyperglycemia  Hypertension: Continue antihypertensives.  Stopped amlodipine given his edema, increased hydralazine.   Hypokalemia: Improving.    Daily BMP to monitor, replace as needed.  Paroxysmal atrial fibrillation: Not on anticoagulation because of liver disease, kidney disease, thrombocytopenia and chronic anemia.  Monitor on telemetry.  Chronic venous stasis of bilateral lower extremities: Continue leg wraps   Goals of care: Consulted palliative care because of patient's multiple comorbidities including CHF, advanced kidney disease, liver cirrhosis with anasarca.  Patient met with palliative, continue full scope of care, full code.  Set some time limit for intubation.  Niece would be surrogate decision maker.  Declines to go to SNF, thinks he can manage fine at home on his own.  Agreeable to dialysis if needed.     Morbid obesity: Body  mass index is 41.41 kg/m.  Complicates overall care and prognosis  DVT prophylaxis: enoxaparin (LOVENOX) injection 40 mg Start: 04/24/20 1000   Diet:  Diet Orders (From admission, onward)    Start     Ordered   04/23/20 1934   Diet heart healthy/carb modified Room service appropriate? Yes; Fluid consistency: Thin  Diet effective now       Question Answer Comment  Diet-HS Snack? Nothing   Room service appropriate? Yes   Fluid consistency: Thin      04/23/20 1936            Code Status: Full Code    Subjective 04/28/20    Patient seen up in chair this morning along with cardiologist.  He reports feeling okay, has to urinate but says he cannot use urinal, requests bed pan.  He continues to insist he can take care of himself at home, refuses SNF for rehab.  He denies SOB or CP, fever/chills or other acute complaints.   Disposition Plan & Communication   Status is: Inpatient  Remains inpatient appropriate because:IV treatments appropriate due to intensity of illness or inability to take PO   Dispo: The patient is from: Home              Anticipated d/c is to: Home              Anticipated d/c date is: 2 days              Patient currently is not medically stable to d/c.        Family Communication: None at bedside, will attempt to call niece.     Consults, Procedures, Significant Events   Consultants:   Cardiology  Nephrology  Procedures:   None  Antimicrobials:   Ancef   Objective   Vitals:   04/27/20 1735 04/27/20 2011 04/28/20 0408 04/28/20 0815  BP:  (!) 130/49 (!) 133/53 128/68  Pulse:  73 70 79  Resp: '18 20 20 18  '$ Temp:  98 F (36.7 C) 97.7 F (36.5 C) 98.2 F (36.8 C)  TempSrc:  Oral Oral   SpO2:  100% 98% 100%  Weight:   (!) 130.9 kg   Height:        Intake/Output Summary (Last 24 hours) at 04/28/2020 4801 Last data filed at 04/27/2020 1948 Gross per 24 hour  Intake 312.58 ml  Output 500 ml  Net -187.42 ml   Filed Weights   04/26/20 0347 04/27/20 0455 04/28/20 0408  Weight: (!) 133.4 kg (!) 131.3 kg (!) 130.9 kg    Physical Exam:  General exam: awake, alert, no acute distress, morbidly obese HEENT: moist mucus membranes, hearing grossly normal,  wearing glasses Respiratory system: CTAB but diminished due to body habitus, normal respiratory effort. Cardiovascular system: normal S1/S2, RRR, cannot appreciate JVD due to body habitus, continues to have indurated / pitting edema proximal to the leg wraps.   Central nervous system: A&O x3. no gross focal neurologic deficits, normal speech Extremities: moves all, bilateral lower extremities in wraps with proximal pitting edema Skin: dry, intact, normal temperature and color Psychiatry: normal mood, congruent affect, abnormal judgement and insight regarding his overall condition  Labs   Data Reviewed: I have personally reviewed following labs and imaging studies  CBC: Recent Labs  Lab 04/23/20 1506 04/28/20 0530  WBC 9.6 4.7  NEUTROABS 8.8*  --   HGB 7.9* 7.4*  HCT 24.5* 21.9*  MCV 94.2 91.6  PLT 123* 121*  Basic Metabolic Panel: Recent Labs  Lab 04/24/20 0607 04/25/20 0503 04/26/20 0526 04/27/20 0256 04/28/20 0530  NA 138 140 138 136 137  K 3.3* 3.1* 3.3* 3.4* 3.6  CL 104 103 101 97* 98  CO2 '25 26 27 28 30  '$ GLUCOSE 154* 152* 155* 214* 182*  BUN 64* 73* 74* 78* 73*  CREATININE 3.03* 3.04* 3.14* 2.86* 2.96*  CALCIUM 8.2* 8.1* 8.7* 8.5* 8.6*  MG  --   --  1.7  --  1.6*   GFR: Estimated Creatinine Clearance: 32.5 mL/min (A) (by C-G formula based on SCr of 2.96 mg/dL (H)). Liver Function Tests: Recent Labs  Lab 04/23/20 1506  AST 32  ALT 19  ALKPHOS 91  BILITOT 1.0  PROT 7.7  ALBUMIN 3.4*   No results for input(s): LIPASE, AMYLASE in the last 168 hours. No results for input(s): AMMONIA in the last 168 hours. Coagulation Profile: Recent Labs  Lab 04/23/20 1506  INR 1.3*   Cardiac Enzymes: No results for input(s): CKTOTAL, CKMB, CKMBINDEX, TROPONINI in the last 168 hours. BNP (last 3 results) No results for input(s): PROBNP in the last 8760 hours. HbA1C: No results for input(s): HGBA1C in the last 72 hours. CBG: Recent Labs  Lab 04/27/20 0724  04/27/20 1153 04/27/20 1613 04/27/20 2041 04/28/20 0817  GLUCAP 184* 235* 210* 200* 170*   Lipid Profile: No results for input(s): CHOL, HDL, LDLCALC, TRIG, CHOLHDL, LDLDIRECT in the last 72 hours. Thyroid Function Tests: No results for input(s): TSH, T4TOTAL, FREET4, T3FREE, THYROIDAB in the last 72 hours. Anemia Panel: No results for input(s): VITAMINB12, FOLATE, FERRITIN, TIBC, IRON, RETICCTPCT in the last 72 hours. Sepsis Labs: Recent Labs  Lab 04/23/20 1506 04/23/20 1944 04/23/20 2142  LATICACIDVEN 3.0* 1.2 1.3    Recent Results (from the past 240 hour(s))  Blood Culture (routine x 2)     Status: None   Collection Time: 04/23/20  3:06 PM   Specimen: BLOOD  Result Value Ref Range Status   Specimen Description BLOOD RAC  Final   Special Requests   Final    BOTTLES DRAWN AEROBIC AND ANAEROBIC Blood Culture results may not be optimal due to an inadequate volume of blood received in culture bottles   Culture   Final    NO GROWTH 5 DAYS Performed at [facility], [address] [place], [place] [address]    Report Status 04/28/2020 FINAL  Final  Blood Culture (routine x 2)     Status: Abnormal   Collection Time: 04/23/20  3:06 PM   Specimen: BLOOD RIGHT FOREARM  Result Value Ref Range Status   Specimen Description   Final    BLOOD RIGHT FOREARM Performed at [facility], [place] [address], [place], [place] [address]    Special Requests   Final    BOTTLES DRAWN AEROBIC AND ANAEROBIC Blood Culture adequate volume Performed at [facility], [address] [place], [place] [address]    Culture  Setup Time   Final    GRAM NEGATIVE RODS IN BOTH AEROBIC AND ANAEROBIC BOTTLES CRITICAL RESULT CALLED TO, READ BACK BY AND VERIFIED WITH: SCOTT HALL 04/24/20 AT 0618 HS Performed at [facility], [address] [place], [place] [address]    Culture KLEBSIELLA PNEUMONIAE (A)  Final   Report Status 04/26/2020 FINAL  Final   Organism ID, Bacteria  KLEBSIELLA PNEUMONIAE  Final      Susceptibility   Klebsiella pneumoniae - MIC*    AMPICILLIN >=  32 RESISTANT Resistant     CEFAZOLIN <=4 SENSITIVE Sensitive     CEFEPIME <=0.12 SENSITIVE Sensitive     CEFTAZIDIME <=1 SENSITIVE Sensitive     CEFTRIAXONE <=0.25 SENSITIVE Sensitive     CIPROFLOXACIN <=0.25 SENSITIVE Sensitive     GENTAMICIN <=1 SENSITIVE Sensitive     IMIPENEM <=0.25 SENSITIVE Sensitive     TRIMETH/SULFA <=20 SENSITIVE Sensitive     AMPICILLIN/SULBACTAM >=32 RESISTANT Resistant     PIP/TAZO <=4 SENSITIVE Sensitive     * KLEBSIELLA PNEUMONIAE  Blood Culture ID Panel (Reflexed)     Status: Abnormal   Collection Time: 04/23/20  3:06 PM  Result Value Ref Range Status   Enterococcus species NOT DETECTED NOT DETECTED Final   Listeria monocytogenes NOT DETECTED NOT DETECTED Final   Staphylococcus species NOT DETECTED NOT DETECTED Final   Staphylococcus aureus (BCID) NOT DETECTED NOT DETECTED Final   Streptococcus species NOT DETECTED NOT DETECTED Final   Streptococcus agalactiae NOT DETECTED NOT DETECTED Final   Streptococcus pneumoniae NOT DETECTED NOT DETECTED Final   Streptococcus pyogenes NOT DETECTED NOT DETECTED Final   Acinetobacter baumannii NOT DETECTED NOT DETECTED Final   Enterobacteriaceae species DETECTED (A) NOT DETECTED Final    Comment: Enterobacteriaceae represent a large family of gram-negative bacteria, not a single organism. CRITICAL RESULT CALLED TO, READ BACK BY AND VERIFIED WITH: SCOTT HALL 04/24/20 AT 0618 HS    Enterobacter cloacae complex NOT DETECTED NOT DETECTED Final   Escherichia coli NOT DETECTED NOT DETECTED Final   Klebsiella oxytoca NOT DETECTED NOT DETECTED Final   Klebsiella pneumoniae DETECTED (A) NOT DETECTED Final    Comment: CRITICAL RESULT CALLED TO, READ BACK BY AND VERIFIED WITH: SCOTT HALL 04/24/20 AT 0618 HS    Proteus species NOT DETECTED NOT DETECTED Final   Serratia marcescens NOT DETECTED NOT DETECTED Final   Carbapenem  resistance NOT DETECTED NOT DETECTED Final   Haemophilus influenzae NOT DETECTED NOT DETECTED Final   Neisseria meningitidis NOT DETECTED NOT DETECTED Final   Pseudomonas aeruginosa NOT DETECTED NOT DETECTED Final   Candida albicans NOT DETECTED NOT DETECTED Final   Candida glabrata NOT DETECTED NOT DETECTED Final   Candida krusei NOT DETECTED NOT DETECTED Final   Candida parapsilosis NOT DETECTED NOT DETECTED Final   Candida tropicalis NOT DETECTED NOT DETECTED Final    Comment: Performed at [facility], [address] [place], [place] [address]  SARS Coronavirus 2 by RT PCR (hospital order, performed in [place] hospital lab)     Status: None   Collection Time: 04/23/20  3:46 PM  Result Value Ref Range Status   SARS Coronavirus 2 NEGATIVE NEGATIVE Final    Comment: (NOTE) SARS-CoV-2 target nucleic acids are NOT DETECTED.  The SARS-CoV-2 RNA is generally detectable in upper and lower respiratory specimens during the acute phase of infection. The lowest concentration of SARS-CoV-2 viral copies this assay can detect is 250 copies / mL. A negative result does not preclude SARS-CoV-2 infection and should not be used as the sole basis for treatment or other patient management decisions.  A negative result may occur with improper specimen collection / handling, submission of specimen other than nasopharyngeal swab, presence of viral mutation(s) within the areas targeted by this assay, and inadequate number of viral copies (<250 copies / mL). A negative result must be combined with clinical observations, patient history, and epidemiological information.  Fact Sheet for Patients:   StrictlyIdeas.no  Fact Sheet for Healthcare Providers: BankingDealers.co.za  This test  is not yet approved or  cleared by the [place] and has been authorized for detection and/or diagnosis of SARS-CoV-2 by FDA under an Emergency Use  Authorization (EUA).  This EUA will remain in effect (meaning this test can be used) for the duration of the COVID-19 declaration under Section 564(b)(1) of the Act, 21 U.S.C. section 360bbb-3(b)(1), unless the authorization is terminated or revoked sooner.  Performed at [facility], [address], [place], [place] [address]       Imaging Studies   No results found.   Medications   Scheduled Meds:  amLODipine  5 mg Oral Daily   atorvastatin  20 mg Oral Daily   enoxaparin (LOVENOX) injection  40 mg Subcutaneous Q12H   ferrous sulfate  325 mg Oral Daily   hydrALAZINE  25 mg Oral BID   insulin aspart  0-20 Units Subcutaneous TID WC   insulin aspart  0-5 Units Subcutaneous QHS   lactulose  60 g Oral Daily   losartan  25 mg Oral Daily   pantoprazole  40 mg Oral BID   potassium chloride  40 mEq Oral Daily   rifaximin  550 mg Oral BID   sodium chloride flush  3 mL Intravenous Q12H   spironolactone  25 mg Oral Daily   torsemide  40 mg Oral Daily   Continuous Infusions:  sodium chloride      ceFAZolin (ANCEF) IV 2 g (04/28/20 0547)       LOS: 5 days    Time spent: 30 minutes    Ezekiel Slocumb, DO [facility]  04/28/2020, 9:18 AM    If 7PM-7AM, please contact night-coverage. How to contact the [facility] Attending or Consulting provider [place] or covering provider during after hours [place], for this patient?    1. Check the care team in [facility] and look for a) attending/consulting [facility] provider listed and b) the [facility] team listed 2. Log into [email or web address] and use [place]'s universal password to access. If you do not have the password, please contact the hospital operator. 3. Locate the [facility] provider you are looking for under [facility] and page to a number that you can be directly reached. 4. If you still have difficulty reaching the provider, please page the [facility] (Director on Call) for the Hospitalists listed on amion for  assistance.

## 2020-04-28 NOTE — Progress Notes (Signed)
Inpatient Diabetes Program Recommendations  AACE/ADA: New Consensus Statement on Inpatient Glycemic Control (2015)  Target Ranges:  Prepandial:   less than 140 mg/dL      Peak postprandial:   less than 180 mg/dL (1-2 hours)      Critically ill patients:  140 - 180 mg/dL   Lab Results  Component Value Date   GLUCAP 170 (H) 04/28/2020   HGBA1C 6.9 (H) 02/23/2020    Review of Glycemic Control Results for Corey West, Corey West (MRN [id number]) as of 04/28/2020 10:51  Ref. Range 04/27/2020 07:24 04/27/2020 11:53 04/27/2020 16:13 04/27/2020 20:41 04/28/2020 08:17  Glucose-Capillary Latest Ref Range: 70 - 99 mg/dL 184 (H) 235 (H) 210 (H) 200 (H) 170 (H)   Diabetes history: DM 2 Outpatient Diabetes medications: 70/30 0-84 units bid SSI Current orders for Inpatient glycemic control:  Novolog 0-20 units tid + hs  A1c 6.9% on 5/25 Creat/BUN: 2.96/73  Inpatient Diabetes Program Recommendations:    Glucose trends elevated and increases after meal intake.  - Consider adding Levemir 10 units.  Thanks,  Tama Headings RN, MSN, BC-ADM Inpatient Diabetes Coordinator Team Pager [phone] (8a-5p)

## 2020-04-28 NOTE — Progress Notes (Signed)
Palliative:  Mr. Smithey is sitting up in the [place] chair in his room.  He greets me making and mostly keeping eye contact.  He appears morbidly obese, chronically ill and frail.  He is alert and oriented, able to make his basic needs known.  There is no family at bedside at this time.   We talked about Mr. Perlmutter labs including but not limited to creatinine, red and white blood cells.  Mr. Bless asks about the management of his hemoglobin.  He tells me that 7.4 is not unusual for him.  I share that nephrology is considering Epogen, but Mr. Macgregor tells me that his hematologist usually transfuse as.  I share that one of the tenets of palliative medicine is that he decides what he does and does not want.  Conference with attending, bedside nursing staff, transition of care team related to patient condition, needs, goals of care, disposition.  Plan: Anticipate home with home health as Mr. Jipson declines short-term rehab.  Rehospitalize as needed.  Full scope/full code, did set some limits on length of time for intubation, but ultimately left this up to his surrogate decision-maker, his niece.  I encouraged him to speak with her about what he does and does not want.  45 minutes  Quinn Axe, NP Palliative medicine team Team phone [phone] Greater than 50% of this time was spent counseling and coordinating care related to the above assessment and plan.

## 2020-04-29 DIAGNOSIS — I5033 Acute on chronic diastolic (congestive) heart failure: Secondary | ICD-10-CM | POA: Diagnosis not present

## 2020-04-29 DIAGNOSIS — D649 Anemia, unspecified: Secondary | ICD-10-CM | POA: Diagnosis not present

## 2020-04-29 LAB — CBC
HCT: 21 % — ABNORMAL LOW (ref 39.0–52.0)
Hemoglobin: 7 g/dL — ABNORMAL LOW (ref 13.0–17.0)
MCH: 30.6 pg (ref 26.0–34.0)
MCHC: 33.3 g/dL (ref 30.0–36.0)
MCV: 91.7 fL (ref 80.0–100.0)
Platelets: 112 10*3/uL — ABNORMAL LOW (ref 150–400)
RBC: 2.29 MIL/uL — ABNORMAL LOW (ref 4.22–5.81)
RDW: 16.3 % — ABNORMAL HIGH (ref 11.5–15.5)
WBC: 4.7 10*3/uL (ref 4.0–10.5)
nRBC: 0 % (ref 0.0–0.2)

## 2020-04-29 LAB — BASIC METABOLIC PANEL
Anion gap: 13 (ref 5–15)
BUN: 69 mg/dL — ABNORMAL HIGH (ref 8–23)
CO2: 26 mmol/L (ref 22–32)
Calcium: 8.7 mg/dL — ABNORMAL LOW (ref 8.9–10.3)
Chloride: 98 mmol/L (ref 98–111)
Creatinine, Ser: 2.91 mg/dL — ABNORMAL HIGH (ref 0.61–1.24)
GFR calc Af Amer: 25 mL/min — ABNORMAL LOW (ref >60)
GFR calc non Af Amer: 21 mL/min — ABNORMAL LOW (ref >60)
Glucose, Bld: 175 mg/dL — ABNORMAL HIGH (ref 70–99)
Potassium: 3.6 mmol/L (ref 3.5–5.1)
Sodium: 137 mmol/L (ref 135–145)

## 2020-04-29 LAB — GLUCOSE, CAPILLARY
Glucose-Capillary: 172 mg/dL — ABNORMAL HIGH (ref 70–99)
Glucose-Capillary: 216 mg/dL — ABNORMAL HIGH (ref 70–99)
Glucose-Capillary: 249 mg/dL — ABNORMAL HIGH (ref 70–99)
Glucose-Capillary: 211 mg/dL — ABNORMAL HIGH (ref 70–99)

## 2020-04-29 LAB — MAGNESIUM: Magnesium: 1.7 mg/dL (ref 1.7–2.4)

## 2020-04-29 LAB — HEMOGLOBIN AND HEMATOCRIT, BLOOD
HCT: 23.4 % — ABNORMAL LOW (ref 39.0–52.0)
Hemoglobin: 7.9 g/dL — ABNORMAL LOW (ref 13.0–17.0)

## 2020-04-29 MED ORDER — MAGNESIUM SULFATE 2 GM/50ML IV SOLN
2.0000 g | Freq: Once | INTRAVENOUS | Status: AC
  Administered 2020-04-29: 2 g via INTRAVENOUS
  Filled 2020-04-29: qty 50

## 2020-04-29 MED ORDER — FUROSEMIDE 10 MG/ML IJ SOLN
8.0000 mg/h | INTRAVENOUS | Status: DC
  Administered 2020-04-29: 8 mg/h via INTRAVENOUS
  Filled 2020-04-29 (×2): qty 25

## 2020-04-29 MED ORDER — ALBUMIN HUMAN 25 % IV SOLN
12.5000 g | Freq: Three times a day (TID) | INTRAVENOUS | Status: AC
  Administered 2020-04-29 – 2020-05-01 (×9): 12.5 g via INTRAVENOUS
  Filled 2020-04-29 (×8): qty 50

## 2020-04-29 MED ORDER — SODIUM CHLORIDE 0.9 % IV SOLN
300.0000 mg | Freq: Once | INTRAVENOUS | Status: AC
  Administered 2020-04-29: 300 mg via INTRAVENOUS
  Filled 2020-04-29: qty 15

## 2020-04-29 NOTE — Progress Notes (Signed)
PROGRESS NOTE    GILL DELROSSI   GYB:[id number]  DOB: November 29, 1950  PCP: Leonel Ramsay, MD    DOA: 04/23/2020 LOS: 6   Brief Narrative   Corey West is a 69 y.o. 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  He has also been having diarrhea this because he takes lactulose for his "ammonia".   He was febrile (101.8), tachycardic lactic acid was elevated at 3.0.  He was admitted to the hospital for sepsis and acute on chronic diastolic CHF with fluid overload.     Assessment & Plan   Principal Problem:   Sepsis ([place]) Active Problems:   Type 2 diabetes mellitus with diabetic nephropathy, with long-term current use of insulin (HCC)   CKD (chronic kidney disease) stage 4, GFR 15-29 ml/min (HCC)   Obesity, Class III, BMI 40-49.9 (morbid obesity) (HCC)   Cirrhosis (HCC)   Esophageal varices (HCC)   Chronic anemia   Chronic venous stasis dermatitis of both lower extremities   Acute on chronic diastolic CHF (congestive heart failure) (HCC)   Acute hypoxemic respiratory failure (HCC)   Paroxysmal atrial fibrillation (HCC)   COPD with chronic bronchitis (HCC)   Bacteremia due to Klebsiella pneumoniae   Palliative care by specialist   DNR (do not resuscitate) discussion   Sepsis secondary to klebsiella pneumonia bacteremia:  Continue IV Ancef.  Day 5 of antibiotics.  Acute on chronic diastolic CHF/anasarca: Still remains volume overloaded with edema to mid/proximal thighs.  Lasix infusion resumed today per  cardiology and nephrology.   Monitor BMP, daily weights and urine output.  2D echo on 04/23/2020 showed EF estimated at 50 to 55%, mild LVH, grade 2 diastolic dysfunction.   Cardiology and nephrology following.  Will need very close follow up in HF clinic or cardiologist.  Diuresis per consultants.    Liver cirrhosis with anasarca, history of esophageal varices: Continue diuretics, rifaximin and lactulose.  Elevated troponin: This is likely due to demand ischemia.  Patient has been evaluated by cardiologist.  AKI on CKD stage IV: Creatinine slightly improved today to 2.86 from 3.  AKI is likely secondary to cardiorenal syndrome as it is improving with diuresis.  Monitor BMP.    Nephrology is following.  Insulin-dependent diabetes mellitus, episode of hypoglycemia on 04/23/2020: NovoLog as needed for hyperglycemia  Hypertension: Continue antihypertensives.  Stopped amlodipine given his edema, increased hydralazine.   Hypokalemia: Improving.    Daily BMP to monitor, replace as needed.  Paroxysmal atrial fibrillation: Not on anticoagulation because of liver disease, kidney disease, thrombocytopenia and chronic anemia.  Monitor on telemetry.  Chronic venous stasis of bilateral lower extremities: Continue leg wraps   Goals of care: Consulted palliative care because of patient's multiple comorbidities including CHF, advanced kidney disease, liver cirrhosis with anasarca.  Patient met with palliative, continue full scope of care, full code.  Set some time limit for intubation.  Niece would be surrogate decision maker.  Declines to go to SNF, thinks he can manage fine at home on his own.  Agreeable to dialysis if needed.     Morbid obesity: Body mass index is  41.19 kg/m.  Complicates overall care and prognosis  DVT prophylaxis: enoxaparin (LOVENOX) injection 40 mg Start: 04/24/20 1000   Diet:  Diet Orders (From admission, onward)    Start     Ordered   04/23/20 1934  Diet heart  healthy/carb modified Room service appropriate? Yes; Fluid consistency: Thin  Diet effective now       Question Answer Comment  Diet-HS Snack? Nothing   Room service appropriate? Yes   Fluid consistency: Thin      04/23/20 1936            Code Status: Full Code    Subjective 04/29/20    Patient seen up in chair this morning.  No acute events reported overnight.  Patient states "I am just not ready to go home yet today".  Reports that he feels far too weak, but says that he ambulated well with therapy, was told he did well and home health therapy upon discharge was recommended.  He is agreeable to that.  He denies any chest pain or shortness of breath, fevers or chills, nausea vomiting or other complaints.   Disposition Plan & Communication   Status is: Inpatient  Remains inpatient appropriate because:IV treatments appropriate due to intensity of illness or inability to take PO   Dispo: The patient is from: Home              Anticipated d/c is to: Home              Anticipated d/c date is: 2 days              Patient currently is not medically stable to d/c.        Family Communication: None at bedside, will attempt to call niece.     Consults, Procedures, Significant Events   Consultants:   Cardiology  Nephrology  Procedures:   None  Antimicrobials:   Ancef   Objective   Vitals:   04/28/20 1621 04/28/20 1930 04/29/20 0428 04/29/20 0812  BP: (!) 131/49 (!) 128/57 (!) 125/47 (!) 130/61  Pulse: 66 70 74 66  Resp: _0 Temp: 98.1 F (36.7 C) 97.6 F (36.4 C) 98 F (36.7 C) 97.8 F (36.6 C)  TempSrc:  Oral Oral   SpO2: 100% 99% 99% 98%  Weight:   (!) 130.2 kg   Height:        Intake/Output Summary (Last 24 hours) at 04/29/2020 0842 Last data filed at 04/29/2020 2706 Gross per 24 hour  Intake 854.57 ml  Output 500 ml  Net 354.57 ml   Filed Weights   04/27/20 0455 04/28/20 0408 04/29/20 0428  Weight: (!) 131.3 kg (!) 130.9 kg (!) 130.2  kg    Physical Exam:  General exam: awake, alert, no acute distress, morbidly obese Respiratory system: CTAB but diminished due to body habitus, normal respiratory effort. Cardiovascular system: normal S1/S2, RRR, continues to have pitting edema proximal to the leg wraps to mid/proximal thighs bilaterally.   Extremities: moves all, bilateral lower extremities in wraps with proximal pitting edema, distal sensation intact. Psychiatry: normal mood, congruent affect, abnormal judgement and insight regarding his overall condition  Labs   Data Reviewed: I have personally reviewed following labs and imaging studies  CBC: Recent Labs  Lab 04/23/20 1506 04/28/20 0530 04/29/20 0439  WBC 9.6 4.7 4.7  NEUTROABS 8.8*  --   --   HGB 7.9* 7.4* 7.0*  HCT 24.5* 21.9* 21.0*  MCV 94.2 91.6 91.7  PLT 123* 121* 031*   Basic Metabolic Panel: Recent Labs  Lab 04/25/20 0503 04/26/20 0526 04/27/20 0256 04/28/20 0530 04/29/20 0439  NA 140 138 136 137 137  K 3.1* 3.3* 3.4* 3.6 3.6  CL 103 101 97* 98 98  CO2 _0 GLUCOSE 152* 155* 214* 182* 175*  BUN 73* 74* 78* 73* 69*  CREATININE 3.04* 3.14* 2.86* 2.96* 2.91*  CALCIUM 8.1* 8.7* 8.5* 8.6* 8.7*  MG  --  1.7  --  1.6* 1.7   GFR: Estimated Creatinine Clearance: 33 mL/min (A) (by C-G formula based on SCr of 2.91 mg/dL (H)). Liver Function Tests: Recent Labs  Lab 04/23/20 1506  AST 32  ALT 19  ALKPHOS 91  BILITOT 1.0  PROT 7.7  ALBUMIN 3.4*   No results for input(s): LIPASE, AMYLASE in the last 168 hours. No results for input(s): AMMONIA in the last 168 hours. Coagulation Profile: Recent Labs  Lab 04/23/20 1506  INR 1.3*   Cardiac Enzymes: No results for input(s): CKTOTAL, CKMB, CKMBINDEX, TROPONINI in the last 168 hours. BNP (last 3 results) No results for input(s): PROBNP in the last 8760 hours. HbA1C: No results for input(s): HGBA1C in the last 72 hours. CBG: Recent Labs  Lab 04/28/20 0817 04/28/20 1212  04/28/20 1623 04/28/20 2143 04/29/20 0814  GLUCAP 170* 262* 223* 194* 172*   Lipid Profile: No results for input(s): CHOL, HDL, LDLCALC, TRIG, CHOLHDL, LDLDIRECT in the last 72 hours. Thyroid Function Tests: No results for input(s): TSH, T4TOTAL, FREET4, T3FREE, THYROIDAB in the last 72 hours. Anemia Panel: Recent Labs    04/28/20 0530  FERRITIN 35  TIBC 286  IRON 34*   Sepsis Labs: Recent Labs  Lab 04/23/20 1506 04/23/20 1944 04/23/20 2142  LATICACIDVEN 3.0* 1.2 1.3    Recent Results (from the past 240 hour(s))  Blood Culture (routine x 2)     Status: None   Collection Time: 04/23/20  3:06 PM   Specimen: BLOOD  Result Value Ref Range Status   Specimen Description BLOOD RAC  Final   Special Requests   Final    BOTTLES DRAWN AEROBIC AND ANAEROBIC Blood Culture results may not be optimal due to an inadequate volume of blood received in culture bottles   Culture   Final    NO GROWTH 5 DAYS Performed at [facility], [address] [place], [place] [address]    Report Status 04/28/2020 FINAL  Final  Blood Culture (routine x 2)     Status: Abnormal   Collection Time: 04/23/20  3:06 PM   Specimen: BLOOD RIGHT FOREARM  Result Value Ref Range Status   Specimen Description   Final    BLOOD RIGHT FOREARM Performed at [facility], [place] [address], [place], [place] [address]    Special Requests   Final    BOTTLES DRAWN AEROBIC AND ANAEROBIC Blood Culture adequate volume Performed at [facility], [address] [place], [place] [address]    Culture  Setup Time   Final    GRAM NEGATIVE RODS IN BOTH AEROBIC AND ANAEROBIC BOTTLES CRITICAL RESULT CALLED TO, READ BACK BY AND VERIFIED WITH: SCOTT HALL 04/24/20 AT 0618 HS Performed at [facility], [address], [place], [place] [address]    Culture KLEBSIELLA PNEUMONIAE (A)  Final   Report Status 04/26/2020 FINAL  Final   Organism ID, Bacteria KLEBSIELLA PNEUMONIAE  Final       Susceptibility   Klebsiella  pneumoniae - MIC*    AMPICILLIN >=32 RESISTANT Resistant     CEFAZOLIN <=4 SENSITIVE Sensitive     CEFEPIME <=0.12 SENSITIVE Sensitive     CEFTAZIDIME <=1 SENSITIVE Sensitive     CEFTRIAXONE <=0.25 SENSITIVE Sensitive     CIPROFLOXACIN <=0.25 SENSITIVE Sensitive     GENTAMICIN <=1 SENSITIVE Sensitive     IMIPENEM <=0.25 SENSITIVE Sensitive     TRIMETH/SULFA <=20 SENSITIVE Sensitive     AMPICILLIN/SULBACTAM >=32 RESISTANT Resistant     PIP/TAZO <=4 SENSITIVE Sensitive     * KLEBSIELLA PNEUMONIAE  Blood Culture ID Panel (Reflexed)     Status: Abnormal   Collection Time: 04/23/20  3:06 PM  Result Value Ref Range Status   Enterococcus species NOT DETECTED NOT DETECTED Final   Listeria monocytogenes NOT DETECTED NOT DETECTED Final   Staphylococcus species NOT DETECTED NOT DETECTED Final   Staphylococcus aureus (BCID) NOT DETECTED NOT DETECTED Final   Streptococcus species NOT DETECTED NOT DETECTED Final   Streptococcus agalactiae NOT DETECTED NOT DETECTED Final   Streptococcus pneumoniae NOT DETECTED NOT DETECTED Final   Streptococcus pyogenes NOT DETECTED NOT DETECTED Final   Acinetobacter baumannii NOT DETECTED NOT DETECTED Final   Enterobacteriaceae species DETECTED (A) NOT DETECTED Final    Comment: Enterobacteriaceae represent a large family of gram-negative bacteria, not a single organism. CRITICAL RESULT CALLED TO, READ BACK BY AND VERIFIED WITH: SCOTT HALL 04/24/20 AT 0618 HS    Enterobacter cloacae complex NOT DETECTED NOT DETECTED Final   Escherichia coli NOT DETECTED NOT DETECTED Final   Klebsiella oxytoca NOT DETECTED NOT DETECTED Final   Klebsiella pneumoniae DETECTED (A) NOT DETECTED Final    Comment: CRITICAL RESULT CALLED TO, READ BACK BY AND VERIFIED WITH: SCOTT HALL 04/24/20 AT 0618 HS    Proteus species NOT DETECTED NOT DETECTED Final   Serratia marcescens NOT DETECTED NOT DETECTED Final   Carbapenem resistance NOT DETECTED NOT DETECTED  Final   Haemophilus influenzae NOT DETECTED NOT DETECTED Final   Neisseria meningitidis NOT DETECTED NOT DETECTED Final   Pseudomonas aeruginosa NOT DETECTED NOT DETECTED Final   Candida albicans NOT DETECTED NOT DETECTED Final   Candida glabrata NOT DETECTED NOT DETECTED Final   Candida krusei NOT DETECTED NOT DETECTED Final   Candida parapsilosis NOT DETECTED NOT DETECTED Final   Candida tropicalis NOT DETECTED NOT DETECTED Final    Comment: Performed at [facility], [address] [place], [place] [address]  SARS Coronavirus 2 by RT PCR (hospital order, performed in [facility] hospital lab)     Status: None   Collection Time: 04/23/20  3:46 PM  Result Value Ref Range Status   SARS Coronavirus 2 NEGATIVE NEGATIVE Final    Comment: (NOTE) SARS-CoV-2 target nucleic acids are NOT DETECTED.  The SARS-CoV-2 RNA is generally detectable in upper and lower respiratory specimens during the acute phase of infection. The lowest concentration of SARS-CoV-2 viral copies this assay can detect is 250 copies / mL. A negative result does not preclude SARS-CoV-2 infection and should not be used as the sole basis for treatment or other patient management decisions.  A negative result may occur with improper specimen collection / handling, submission of specimen other than nasopharyngeal swab, presence of viral mutation(s) within the areas targeted by this assay, and inadequate number of viral copies (<250 copies / mL). A negative result must be combined with clinical observations, patient history, and epidemiological information.  Fact Sheet for Patients:   StrictlyIdeas.no  Fact Sheet  for Healthcare Providers: BankingDealers.co.za  This test is not yet approved or  cleared by the [place] and has been authorized for detection and/or diagnosis of SARS-CoV-2 by FDA under an Emergency Use Authorization (EUA).  This EUA will  remain in effect (meaning this test can be used) for the duration of the COVID-19 declaration under Section 564(b)(1) of the Act, 21 U.S.C. section 360bbb-3(b)(1), unless the authorization is terminated or revoked sooner.  Performed at [facility], [address] [place], [place] [address]       Imaging Studies   No results found.   Medications   Scheduled Meds: . atorvastatin  20 mg Oral Daily  . [START ON 05/03/2020] darbepoetin (ARANESP) injection - NON-DIALYSIS  100 mcg Subcutaneous Q Tue-1800  . enoxaparin (LOVENOX) injection  40 mg Subcutaneous Q12H  . ferrous sulfate  325 mg Oral Daily  . hydrALAZINE  50 mg Oral BID  . insulin aspart  0-20 Units Subcutaneous TID WC  . insulin aspart  0-5 Units Subcutaneous QHS  . lactulose  60 g Oral Daily  . losartan  25 mg Oral Daily  . metolazone  5 mg Oral Daily  . pantoprazole  40 mg Oral BID  . potassium chloride  40 mEq Oral Daily  . rifaximin  550 mg Oral BID  . sodium chloride flush  3 mL Intravenous Q12H  . spironolactone  25 mg Oral Daily  . torsemide  40 mg Oral Daily   Continuous Infusions: . sodium chloride    .  ceFAZolin (ANCEF) IV Stopped (04/29/20 5996)  . iron sucrose         LOS: 6 days    Time spent: 30 minutes with greater than 50% spent in coordination of care and direct patient contact    Ezekiel Slocumb, DO [facility]  04/29/2020, 8:42 AM    If 7PM-7AM, please contact night-coverage. How to contact the [facility] Attending or Consulting provider [place] or covering provider during after hours [place], for this patient?    1. Check the care team in [facility] and look for a) attending/consulting [facility] provider listed and b) the [facility] team listed 2. Log into [email or web address] and use [place]'s universal password to access. If you do not have the password, please contact the hospital operator. 3. Locate the [facility] provider you are looking for under [facility] and page to a number that you  can be directly reached. 4. If you still have difficulty reaching the provider, please page the [facility] (Director on Call) for the Hospitalists listed on amion for assistance.

## 2020-04-29 NOTE — Progress Notes (Addendum)
Progress Note  Patient Name: Corey West Date of Encounter: 04/29/2020  Primary Cardiologist: Nelva Bush, MD  Subjective   [facility] yesterday w/ PT and felt like he did well.  HHPT/OT recommended at discharge.  He still feels volume overloaded.  Does not feel that UO was very good yesterday - has definitely noted a difference since coming off of lasix gtt.  Received a dose of metolazone last night.  +354 for the day.  Inpatient Medications    Scheduled Meds: . atorvastatin  20 mg Oral Daily  . [START ON 05/03/2020] darbepoetin (ARANESP) injection - NON-DIALYSIS  100 mcg Subcutaneous Q Tue-1800  . enoxaparin (LOVENOX) injection  40 mg Subcutaneous Q12H  . ferrous sulfate  325 mg Oral Daily  . hydrALAZINE  50 mg Oral BID  . insulin aspart  0-20 Units Subcutaneous TID WC  . insulin aspart  0-5 Units Subcutaneous QHS  . lactulose  60 g Oral Daily  . losartan  25 mg Oral Daily  . metolazone  5 mg Oral Daily  . pantoprazole  40 mg Oral BID  . potassium chloride  40 mEq Oral Daily  . rifaximin  550 mg Oral BID  . sodium chloride flush  3 mL Intravenous Q12H  . spironolactone  25 mg Oral Daily  . torsemide  40 mg Oral Daily   Continuous Infusions: . sodium chloride    .  ceFAZolin (ANCEF) IV Stopped (04/29/20 2595)   PRN Meds: sodium chloride, acetaminophen, benzonatate, ipratropium-albuterol, LORazepam, ondansetron (ZOFRAN) IV, oxyCODONE, phenol, sodium chloride, sodium chloride flush, traMADol   Vital Signs    Vitals:   04/28/20 1621 04/28/20 1930 04/29/20 0428 04/29/20 0812  BP: (!) 131/49 (!) 128/57 (!) 125/47 (!) 130/61  Pulse: 66 70 74 66  Resp: 18 19 16 16   Temp: 98.1 F (36.7 C) 97.6 F (36.4 C) 98 F (36.7 C) 97.8 F (36.6 C)  TempSrc:  Oral Oral   SpO2: 100% 99% 99% 98%  Weight:   (!) 130.2 kg   Height:        Intake/Output Summary (Last 24 hours) at 04/29/2020 0825 Last data filed at 04/29/2020 6387 Gross per 24 hour  Intake 854.57 ml  Output 500 ml    Net 354.57 ml   Filed Weights   04/27/20 0455 04/28/20 0408 04/29/20 0428  Weight: (!) 131.3 kg (!) 130.9 kg (!) 130.2 kg    Physical Exam   GEN: Well nourished, well developed, in no acute distress.  HEENT: Grossly normal.  Neck: Supple, obese, difficult to gauge JVP 2/2 habitus. Bilat bruits vs radiated murmur.  No masses. Cardiac: RRR, no murmurs, rubs, or gallops. No clubbing, cyanosis.  Lower legs wrapped.  2+ bilat LE edema involving posterior thights.  Radials/DP/PT 2+ and equal bilaterally.  Respiratory:  Respirations regular and unlabored, clear to auscultation bilaterally. GI: Obese, soft, diffusely tender to minimal palpation.  Nondistended, BS + x 4. MS: no deformity or atrophy. Skin: warm and dry, no rash. Lower legs wrapped.  Mild erythema noted to bilat post thighs. Neuro:  Strength and sensation are intact. Psych: AAOx3.  Normal affect.  Labs    Chemistry Recent Labs  Lab 04/23/20 1506 04/24/20 0607 04/27/20 0256 04/28/20 0530 04/29/20 0439  NA 141   < > 136 137 137  K 3.5   < > 3.4* 3.6 3.6  CL 104   < > 97* 98 98  CO2 24   < > 28 30 26   GLUCOSE 121*   < >  214* 182* 175*  BUN 63*   < > 78* 73* 69*  CREATININE 2.85*   < > 2.86* 2.96* 2.91*  CALCIUM 9.0   < > 8.5* 8.6* 8.7*  PROT 7.7  --   --   --   --   ALBUMIN 3.4*  --   --   --   --   AST 32  --   --   --   --   ALT 19  --   --   --   --   ALKPHOS 91  --   --   --   --   BILITOT 1.0  --   --   --   --   GFRNONAA 22*   < > 22* 21* 21*  GFRAA 25*   < > 25* 24* 25*  ANIONGAP 13   < > 11 9 13    < > = values in this interval not displayed.     Hematology Recent Labs  Lab 04/23/20 1506 04/28/20 0530 04/29/20 0439  WBC 9.6 4.7 4.7  RBC 2.60* 2.39* 2.29*  HGB 7.9* 7.4* 7.0*  HCT 24.5* 21.9* 21.0*  MCV 94.2 91.6 91.7  MCH 30.4 31.0 30.6  MCHC 32.2 33.8 33.3  RDW 17.4* 16.3* 16.3*  PLT 123* 121* 112*    Cardiac Enzymes  Recent Labs  Lab 04/23/20 1525 04/23/20 1854  TROPONINIHS 18* 578*       BNP Recent Labs  Lab 04/23/20 1525  BNP 376.4*      Lipids  Lab Results  Component Value Date   CHOL 141 12/15/2019   HDL 37 (L) 12/15/2019   LDLCALC 93 12/15/2019   TRIG 56 12/15/2019   CHOLHDL 3.8 12/15/2019    HbA1c  Lab Results  Component Value Date   HGBA1C 6.9 (H) 02/23/2020    Radiology    No results found.  Telemetry    RSR - Personally Reviewed  Cardiac Studies   2D echo 04/24/2020: 1. Left ventricular ejection fraction, by estimation, is 50 to 55%. The  left ventricle has low normal function. The left ventricle has no regional  wall motion abnormalities. The left ventricular internal cavity size was  mildly dilated. There is mild  left ventricular hypertrophy. Left ventricular diastolic parameters are  consistent with Grade II diastolic dysfunction (pseudonormalization).  2. Right ventricular systolic function is normal. The right ventricular  size is normal. There is mildly elevated pulmonary artery systolic  pressure.  3. Left atrial size was moderately dilated.  4. Right atrial size was mildly dilated.  5. The mitral valve is normal in structure. Trivial mitral valve  regurgitation. No evidence of mitral stenosis.  6. The aortic valve is abnormal. Aortic valve regurgitation is not  visualized. Mild to moderate aortic valve stenosis. Aortic valve area, by  VTI measures 1.36 cm. Aortic valve mean gradient measures 14.3 mmHg.  7. The inferior vena cava is dilated in size with <50% respiratory  variability, suggesting right atrial pressure of 15 mmHg.  Patient Profile     69 y.o.malewith history of HFpEF, permanent Afib not on [place], CKD stage IV, cirrhosis complicated by portal hypertension, DM2, CKD stage IV, HTN, HLD, chronic anemia, obesity, and OSA not on CPAP,who was admitted 7/24 due to worsening volume overload in the setting ofHFpEF.  Assessment & Plan    1.  Acute on chronic HF w/ preserved EF:  EF 50-55% w/ gr 2 DD by  echo this admission.  Admitted 7/24 after  failing outpt diuretics.  I/O not entirely accurate in the setting of incontinence.  Since changing from lasix gtt to oral torsemide (and metolazone given last night), diuresis has stalled some.  Wt stable this AM @ 130.2 kg.  He was +354 yesterday.  Prev lowest recorded wt was 128.5 kg on 6/15.  Renal fxn stable this AM.  He is due for metolazone 5 and torsemide 40.  Still volume overloaded.  Prefer to push diuresis as inpt in order to avoid readmission. Cont arb, hydral, spiro, amlodipine.  2.  PAF:  Maintaining sinus.  No OAC 2/2 portal HTN/anemia (CHA2DS2VASc = 5).  No AVN blocking agent 2/2 1st deg avb.  3.  Elevated HsTroponin:  Mildly elevated in the setting of sepsis, CHF, and CKD.  Echo w/ nl EF and w/o wma's.  Demand ischemia.  No c/p.  Cont med rx  statin. No ASA 2/2 anemia/portal HTN.  4.  CKD IV:  Stable.  Diuretic mgmt per nephrology.  5.  Hypokalemia:  Stable @ 3.6.  6. Sepsis w/ K pneumoniae bacteremia:  Per IM.  7.  Normocytic anemia:  H/H down further (7/21).  Nephrology to add epo.  8.  Deconditioning: Pt walked yesterday w/ PT.  Needs HHPT/OT.  He still feels volume overloaded and does not feel ready for d/c.  Signed, Murray Hodgkins, NP  04/29/2020, 8:25 AM    For questions or updates, please contact   Please consult [email or web address] for contact info under Cardiology/STEMI.

## 2020-04-29 NOTE — Progress Notes (Signed)
[facility], [place] 04/29/20  Subjective:   LOS: 6 07/29 0701 - 07/30 0700 In: 854.6 [P.O.:720; IV Piggyback:134.6] Out: 500 [Urine:500] Patient known to our practice from outpatient follow-up Admitted from home for worsening shortness of breath, productive cough and Worsening lower extremity edema  Overall doing fair Reports pain left groin, sore in the back Sitting up in the chair today, reports worsening of scrotal edema and lower extremity edema Eating OK No nausea or vomiting No SOB  Objective:  Vital signs in last 24 hours:  Temp:  [97.6 F (36.4 C)-98.4 F (36.9 C)] 97.8 F (36.6 C) (07/30 0812) Pulse Rate:  [66-74] 66 (07/30 0812) Resp:  [16-19] 16 (07/30 0812) BP: (125-146)/(47-61) 130/61 (07/30 0812) SpO2:  [98 %-100 %] 98 % (07/30 0812) Weight:  [130.2 kg] 130.2 kg (07/30 0428)  Weight change: -0.708 kg Filed Weights   04/27/20 0455 04/28/20 0408 04/29/20 0428  Weight: (!) 131.3 kg (!) 130.9 kg (!) 130.2 kg    Intake/Output:    Intake/Output Summary (Last 24 hours) at 04/29/2020 0958 Last data filed at 04/29/2020 0607 Gross per 24 hour  Intake 614.57 ml  Output 500 ml  Net 114.57 ml     Physical Exam: General:  Obese gentleman,   HEENT  moist oral mucous membranes  Pulm/lungs  normal breathing effort, decreased breath sounds at bases  CVS/Heart  no rub  Abdomen:   Soft, obese, nontender  Extremities:  2+ pitting edema, left leg wrapped    Neurologic:  Alert, able to answer questions appropriately  Skin:  Left leg and thigh dependent redness          Basic Metabolic Panel:  Recent Labs  Lab 04/25/20 0503 04/25/20 0503 04/26/20 0526 04/26/20 0526 04/27/20 0256 04/28/20 0530 04/29/20 0439  NA 140  --  138  --  136 137 137  K 3.1*  --  3.3*  --  3.4* 3.6 3.6  CL 103  --  101  --  97* 98 98  CO2 26  --  27  --  28 30 26   GLUCOSE 152*  --  155*  --  214* 182* 175*  BUN 73*  --  74*  --  78* 73* 69*   CREATININE 3.04*  --  3.14*  --  2.86* 2.96* 2.91*  CALCIUM 8.1*   < > 8.7*   < > 8.5* 8.6* 8.7*  MG  --   --  1.7  --   --  1.6* 1.7   < > = values in this interval not displayed.     CBC: Recent Labs  Lab 04/23/20 1506 04/28/20 0530 04/29/20 0439  WBC 9.6 4.7 4.7  NEUTROABS 8.8*  --   --   HGB 7.9* 7.4* 7.0*  HCT 24.5* 21.9* 21.0*  MCV 94.2 91.6 91.7  PLT 123* 121* 112*      Lab Results  Component Value Date   HEPBSAG NON REACTIVE 01/30/2020   HEPBSAB NON REACTIVE 01/30/2020   HEPBIGM NON REACTIVE 01/30/2020      Microbiology:  Recent Results (from the past 240 hour(s))  Blood Culture (routine x 2)     Status: None   Collection Time: 04/23/20  3:06 PM   Specimen: BLOOD  Result Value Ref Range Status   Specimen Description BLOOD RAC  Final   Special Requests   Final    BOTTLES DRAWN AEROBIC AND ANAEROBIC Blood Culture results may not be optimal due to an inadequate volume  of blood received in culture bottles   Culture   Final    NO GROWTH 5 DAYS Performed at [facility] [address] [place], [place] [address]    Report Status 04/28/2020 FINAL  Final  Blood Culture (routine x 2)     Status: Abnormal   Collection Time: 04/23/20  3:06 PM   Specimen: BLOOD RIGHT FOREARM  Result Value Ref Range Status   Specimen Description   Final    BLOOD RIGHT FOREARM Performed at [facility], [address] [address] [place], [place] [address]    Special Requests   Final    BOTTLES DRAWN AEROBIC AND ANAEROBIC Blood Culture adequate volume Performed at [facility], [address] [place], [place] [address]    Culture  Setup Time   Final    GRAM NEGATIVE RODS IN BOTH AEROBIC AND ANAEROBIC BOTTLES CRITICAL RESULT CALLED TO, READ BACK BY AND VERIFIED WITH: SCOTT HALL 04/24/20 AT 0618 HS Performed at [facility], [address] [place], [place] [address]    Culture KLEBSIELLA PNEUMONIAE (A)  Final   Report Status 04/26/2020 FINAL  Final    Organism ID, Bacteria KLEBSIELLA PNEUMONIAE  Final      Susceptibility   Klebsiella pneumoniae - MIC*    AMPICILLIN >=32 RESISTANT Resistant     CEFAZOLIN <=4 SENSITIVE Sensitive     CEFEPIME <=0.12 SENSITIVE Sensitive     CEFTAZIDIME <=1 SENSITIVE Sensitive     CEFTRIAXONE <=0.25 SENSITIVE Sensitive     CIPROFLOXACIN <=0.25 SENSITIVE Sensitive     GENTAMICIN <=1 SENSITIVE Sensitive     IMIPENEM <=0.25 SENSITIVE Sensitive     TRIMETH/SULFA <=20 SENSITIVE Sensitive     AMPICILLIN/SULBACTAM >=32 RESISTANT Resistant     PIP/TAZO <=4 SENSITIVE Sensitive     * KLEBSIELLA PNEUMONIAE  Blood Culture ID Panel (Reflexed)     Status: Abnormal   Collection Time: 04/23/20  3:06 PM  Result Value Ref Range Status   Enterococcus species NOT DETECTED NOT DETECTED Final   Listeria monocytogenes NOT DETECTED NOT DETECTED Final   Staphylococcus species NOT DETECTED NOT DETECTED Final   Staphylococcus aureus (BCID) NOT DETECTED NOT DETECTED Final   Streptococcus species NOT DETECTED NOT DETECTED Final   Streptococcus agalactiae NOT DETECTED NOT DETECTED Final   Streptococcus pneumoniae NOT DETECTED NOT DETECTED Final   Streptococcus pyogenes NOT DETECTED NOT DETECTED Final   Acinetobacter baumannii NOT DETECTED NOT DETECTED Final   Enterobacteriaceae species DETECTED (A) NOT DETECTED Final    Comment: Enterobacteriaceae represent a large family of gram-negative bacteria, not a single organism. CRITICAL RESULT CALLED TO, READ BACK BY AND VERIFIED WITH: SCOTT HALL 04/24/20 AT 0618 HS    Enterobacter cloacae complex NOT DETECTED NOT DETECTED Final   Escherichia coli NOT DETECTED NOT DETECTED Final   Klebsiella oxytoca NOT DETECTED NOT DETECTED Final   Klebsiella pneumoniae DETECTED (A) NOT DETECTED Final    Comment: CRITICAL RESULT CALLED TO, READ BACK BY AND VERIFIED WITH: SCOTT HALL 04/24/20 AT 0618 HS    Proteus species NOT DETECTED NOT DETECTED Final   Serratia marcescens NOT DETECTED NOT DETECTED  Final   Carbapenem resistance NOT DETECTED NOT DETECTED Final   Haemophilus influenzae NOT DETECTED NOT DETECTED Final   Neisseria meningitidis NOT DETECTED NOT DETECTED Final   Pseudomonas aeruginosa NOT DETECTED NOT DETECTED Final   Candida albicans NOT DETECTED NOT DETECTED Final   Candida glabrata NOT DETECTED NOT DETECTED Final   Candida krusei NOT  DETECTED NOT DETECTED Final   Candida parapsilosis NOT DETECTED NOT DETECTED Final   Candida tropicalis NOT DETECTED NOT DETECTED Final    Comment: Performed at [facility], [address] [place], [place] [address]  SARS Coronavirus 2 by RT PCR (hospital order, performed in [facility] lab)     Status: None   Collection Time: 04/23/20  3:46 PM  Result Value Ref Range Status   SARS Coronavirus 2 NEGATIVE NEGATIVE Final    Comment: (NOTE) SARS-CoV-2 target nucleic acids are NOT DETECTED.  The SARS-CoV-2 RNA is generally detectable in upper and lower respiratory specimens during the acute phase of infection. The lowest concentration of SARS-CoV-2 viral copies this assay can detect is 250 copies / mL. A negative result does not preclude SARS-CoV-2 infection and should not be used as the sole basis for treatment or other patient management decisions.  A negative result may occur with improper specimen collection / handling, submission of specimen other than nasopharyngeal swab, presence of viral mutation(s) within the areas targeted by this assay, and inadequate number of viral copies (<250 copies / mL). A negative result must be combined with clinical observations, patient history, and epidemiological information.  Fact Sheet for Patients:   StrictlyIdeas.no  Fact Sheet for Healthcare Providers: BankingDealers.co.za  This test is not yet approved or  cleared by the [place] FDA and has been authorized for detection and/or diagnosis of SARS-CoV-2 by FDA under an  Emergency Use Authorization (EUA).  This EUA will remain in effect (meaning this test can be used) for the duration of the COVID-19 declaration under Section 564(b)(1) of the Act, 21 U.S.C. section 360bbb-3(b)(1), unless the authorization is terminated or revoked sooner.  Performed at [facility], [address] [place], [place] [address]     Coagulation Studies: No results for input(s): LABPROT, INR in the last 72 hours.  Urinalysis: No results for input(s): COLORURINE, LABSPEC, PHURINE, GLUCOSEU, HGBUR, BILIRUBINUR, KETONESUR, PROTEINUR, UROBILINOGEN, NITRITE, LEUKOCYTESUR in the last 72 hours.  Invalid input(s): APPERANCEUR    Imaging: No results found.   Medications:    sodium chloride      ceFAZolin (ANCEF) IV Stopped (04/29/20 7106)   furosemide (LASIX) infusion     iron sucrose      atorvastatin  20 mg Oral Daily   [START ON 05/03/2020] darbepoetin (ARANESP) injection - NON-DIALYSIS  100 mcg Subcutaneous Q Tue-1800   enoxaparin (LOVENOX) injection  40 mg Subcutaneous Q12H   ferrous sulfate  325 mg Oral Daily   hydrALAZINE  50 mg Oral BID   insulin aspart  0-20 Units Subcutaneous TID WC   insulin aspart  0-5 Units Subcutaneous QHS   lactulose  60 g Oral Daily   losartan  25 mg Oral Daily   pantoprazole  40 mg Oral BID   potassium chloride  40 mEq Oral Daily   rifaximin  550 mg Oral BID   sodium chloride flush  3 mL Intravenous Q12H   spironolactone  25 mg Oral Daily   sodium chloride, acetaminophen, benzonatate, ipratropium-albuterol, LORazepam, ondansetron (ZOFRAN) IV, oxyCODONE, phenol, sodium chloride, sodium chloride flush, traMADol  Assessment/ Plan:  69 y.o. male with   Principal Problem:   Sepsis ([place]) Active Problems:   Cirrhosis ([place])   Esophageal varices ([place])   Chronic anemia   Chronic venous stasis dermatitis of both lower extremities   Obesity, Class III, BMI 40-49.9 (morbid obesity) ([place])   Type 2 diabetes  mellitus with diabetic nephropathy, with long-term  current use of insulin (HCC)   Acute on chronic diastolic CHF (congestive heart failure) (HCC)   CKD (chronic kidney disease) stage 4, GFR 15-29 ml/min (HCC)   Acute hypoxemic respiratory failure (HCC)   Paroxysmal atrial fibrillation (HCC)   COPD with chronic bronchitis (HCC)   Bacteremia due to Klebsiella pneumoniae   Palliative care by specialist   DNR (do not resuscitate) discussion   #.  Acute kidney injury on CKD st 4 Recent Labs    04/26/20 0526 04/27/20 0256 04/28/20 0530 04/29/20 0439  CREATININE 3.14* 2.86* 2.96* 2.91*  AKI is likely secondary to abnormal cardiorenal hemodynamics in setting of liver cirrhosis.  Underlying CKD likely secondary to diabetes. CT abdomen without contrast on July 24 confirmed cirrhosis, portal venous hypertension, cholelithiasis, aortic atherosclerosis, three-vessel coronary disease, aortic valve calcifications, no hydronephrosis Patient is nonoliguric Serum creatinine is stabilizing and has started to trend down today. No acute indication for dialysis at present   #. Anemia of CKD  Lab Results  Component Value Date   HGB 7.0 (L) 04/29/2020  Discussed ESA therapy with patient.  Patient has been evaluated by Dr. Grayland Ormond as outpatient who recommended IV iron and ESA therapy.  He has also received blood transfusion as outpatient We will start Aranesp weekly while inpatient.  #. Diabetes type 2 with CKD Hemoglobin A1C (%)  Date Value  05/10/2014 11.0 (H)   Hgb A1c MFr Bld (%)  Date Value  02/23/2020 6.9 (H)   #Cirrhosis with volume overload  changed back to iv lasix infusion on 7/30 as oral diuresis was ineffective Will add albumin for oncotic support   #Hypokalemia Due to aggressive diuresis. Replace orally as necessary. Continue spironolactone    LOS: [place] 7/30/20219:58 AM  [facility] [place], [place]

## 2020-04-29 NOTE — Care Management Important Message (Signed)
Important Message  Patient Details  Name: Corey West MRN: [id number] Date of Birth: 03/02/1951   Medicare Important Message Given:  Yes     Dannette Barbara 04/29/2020, 11:50 AM

## 2020-04-29 NOTE — Progress Notes (Signed)
Occupational Therapy Treatment Patient Details Name: Corey West MRN: [id number] DOB: 15-Feb-1951 Today's Date: 04/29/2020    History of present illness 69 y.o. male who came in with weakness and shortness of breath.  Medical history significant of morbid obesity, chronic lymphedema and venous stasis, recurrent UTIs.  Type 2 diabetes, CKD-4, liver cirrhosis, hx of GI bleeds, chronic iron deficiency anemia, GERD, likely OSA, chronic thrombocytopenia, HTN, HLD.  Chronic weeping and swelling of his legs.   OT comments  Pt seen for OT tx. Pt received standing in front of recliner attempting to address malfunctioning male purewick system with urine on the floor. Pt refusing urinal, declining assist for changing gown despite gown being saturated with urine. Assisted pt with temporary option of disposable chuck pad between legs and educated in importance of hygiene to minimize risk of skin breakdown. RN notified of pt's situation. Pt continues to decline chair alarm. Continue to recommend [place].     Follow Up Recommendations  Home health OT    Equipment Recommendations  3 in 1 bedside commode    Recommendations for Other Services      Precautions / Restrictions Precautions Precautions: Fall Restrictions Weight Bearing Restrictions: No       Mobility Bed Mobility                  Transfers Overall transfer level: Needs assistance Equipment used: Rolling walker (2 wheeled) Transfers: Sit to/from Stand Sit to Stand: Supervision         General transfer comment: Pt standing upon OT's arrival attempting to address male purewick malfunctioning    Balance Overall balance assessment: Mild deficits observed, not formally tested;History of Falls                                         ADL either performed or assessed with clinical judgement   ADL Overall ADL's : Needs assistance/impaired                                       General ADL  Comments: Assist for toileting from recliner in standing, pt refusing urinal and the male purewick malfunctioning, pt declining assist for changing gown despite gown being saturated with urine.     Vision Baseline Vision/History: Wears glasses Patient Visual Report: No change from baseline     Perception     Praxis      Cognition Arousal/Alertness: Awake/alert Behavior During Therapy: WFL for tasks assessed/performed Overall Cognitive Status: Within Functional Limits for tasks assessed                                 General Comments: decreased safety awareness and awareness of deficits        Exercises     Shoulder Instructions       General Comments      Pertinent Vitals/ Pain       Pain Assessment: No/denies pain  Home Living                                          Prior Functioning/Environment  Frequency  Min 2X/week        Progress Toward Goals  OT Goals(current goals can now be found in the care plan section)  Progress towards OT goals: OT to reassess next treatment  Acute Rehab OT Goals Patient Stated Goal: go home OT Goal Formulation: With patient Time For Goal Achievement: 05/12/20 Potential to Achieve Goals: Good  Plan Discharge plan remains appropriate;Frequency remains appropriate    Co-evaluation                 AM-PAC OT "6 Clicks" Daily Activity     Outcome Measure   Help from another person eating meals?: None Help from another person taking care of personal grooming?: A Little Help from another person toileting, which includes using toliet, bedpan, or urinal?: A Lot Help from another person bathing (including washing, rinsing, drying)?: A Little Help from another person to put on and taking off regular upper body clothing?: None Help from another person to put on and taking off regular lower body clothing?: A Lot 6 Click Score: 18    End of Session Equipment Utilized During  Treatment: Rolling walker  OT Visit Diagnosis: Other abnormalities of gait and mobility (R26.89);Muscle weakness (generalized) (M62.81)   Activity Tolerance Patient tolerated treatment well   Patient Left in chair;with call bell/phone within reach   Nurse Communication Mobility status;Other (comment) (pt declining chair alarm, gown change, male purewick not working)        Time: 6803-2122 OT Time Calculation (min): 8 min  Charges: OT General Charges $OT Visit: 1 Visit OT Treatments $Self Care/Home Management : 8-22 mins  Jeni Salles, MPH, MS, OTR/L ascom [phone] 04/29/20, 4:40 PM

## 2020-04-30 ENCOUNTER — Inpatient Hospital Stay: Payer: Medicare Other

## 2020-04-30 DIAGNOSIS — I5033 Acute on chronic diastolic (congestive) heart failure: Secondary | ICD-10-CM | POA: Diagnosis not present

## 2020-04-30 LAB — BASIC METABOLIC PANEL
Anion gap: 11 (ref 5–15)
BUN: 67 mg/dL — ABNORMAL HIGH (ref 8–23)
CO2: 27 mmol/L (ref 22–32)
Calcium: 8.8 mg/dL — ABNORMAL LOW (ref 8.9–10.3)
Chloride: 93 mmol/L — ABNORMAL LOW (ref 98–111)
Creatinine, Ser: 2.97 mg/dL — ABNORMAL HIGH (ref 0.61–1.24)
GFR calc Af Amer: 24 mL/min — ABNORMAL LOW (ref >60)
GFR calc non Af Amer: 21 mL/min — ABNORMAL LOW (ref >60)
Glucose, Bld: 170 mg/dL — ABNORMAL HIGH (ref 70–99)
Potassium: 3.6 mmol/L (ref 3.5–5.1)
Sodium: 131 mmol/L — ABNORMAL LOW (ref 135–145)

## 2020-04-30 LAB — CBC
HCT: 21.2 % — ABNORMAL LOW (ref 39.0–52.0)
Hemoglobin: 6.7 g/dL — ABNORMAL LOW (ref 13.0–17.0)
MCH: 29.6 pg (ref 26.0–34.0)
MCHC: 31.6 g/dL (ref 30.0–36.0)
MCV: 93.8 fL (ref 80.0–100.0)
Platelets: 111 10*3/uL — ABNORMAL LOW (ref 150–400)
RBC: 2.26 MIL/uL — ABNORMAL LOW (ref 4.22–5.81)
RDW: 16.2 % — ABNORMAL HIGH (ref 11.5–15.5)
WBC: 4.6 10*3/uL (ref 4.0–10.5)
nRBC: 0 % (ref 0.0–0.2)

## 2020-04-30 LAB — GLUCOSE, CAPILLARY
Glucose-Capillary: 143 mg/dL — ABNORMAL HIGH (ref 70–99)
Glucose-Capillary: 181 mg/dL — ABNORMAL HIGH (ref 70–99)
Glucose-Capillary: 198 mg/dL — ABNORMAL HIGH (ref 70–99)
Glucose-Capillary: 203 mg/dL — ABNORMAL HIGH (ref 70–99)

## 2020-04-30 LAB — MAGNESIUM: Magnesium: 1.7 mg/dL (ref 1.7–2.4)

## 2020-04-30 LAB — TROPONIN I (HIGH SENSITIVITY)
Troponin I (High Sensitivity): 46 ng/L — ABNORMAL HIGH (ref <18)
Troponin I (High Sensitivity): 44 ng/L — ABNORMAL HIGH (ref <18)

## 2020-04-30 LAB — PREPARE RBC (CROSSMATCH)

## 2020-04-30 LAB — HEMOGLOBIN AND HEMATOCRIT, BLOOD
HCT: 23.2 % — ABNORMAL LOW (ref 39.0–52.0)
Hemoglobin: 7.5 g/dL — ABNORMAL LOW (ref 13.0–17.0)

## 2020-04-30 MED ORDER — MAGNESIUM SULFATE 2 GM/50ML IV SOLN
2.0000 g | Freq: Once | INTRAVENOUS | Status: AC
  Administered 2020-04-30: 2 g via INTRAVENOUS
  Filled 2020-04-30: qty 50

## 2020-04-30 MED ORDER — SODIUM CHLORIDE 0.9% IV SOLUTION: Freq: Once | INTRAVENOUS | Status: DC

## 2020-04-30 NOTE — Progress Notes (Signed)
Progress Note  Patient Name: Corey West Date of Encounter: 04/30/2020  [facility] Cardiologist: Nelva Bush, MD   Subjective   No CP or dyspnea  Inpatient Medications    Scheduled Meds: . sodium chloride   Intravenous Once  . atorvastatin  20 mg Oral Daily  . [START ON 05/03/2020] darbepoetin (ARANESP) injection - NON-DIALYSIS  100 mcg Subcutaneous Q Tue-1800  . enoxaparin (LOVENOX) injection  40 mg Subcutaneous Q12H  . ferrous sulfate  325 mg Oral Daily  . hydrALAZINE  50 mg Oral BID  . insulin aspart  0-20 Units Subcutaneous TID WC  . insulin aspart  0-5 Units Subcutaneous QHS  . lactulose  60 g Oral Daily  . losartan  25 mg Oral Daily  . pantoprazole  40 mg Oral BID  . potassium chloride  40 mEq Oral Daily  . rifaximin  550 mg Oral BID  . sodium chloride flush  3 mL Intravenous Q12H  . spironolactone  25 mg Oral Daily   Continuous Infusions: . sodium chloride    . albumin human 12.5 g (04/30/20 0919)  .  ceFAZolin (ANCEF) IV Stopped (04/30/20 5409)  . furosemide (LASIX) infusion 8 mg/hr (04/29/20 1058)   PRN Meds: sodium chloride, acetaminophen, benzonatate, ipratropium-albuterol, LORazepam, ondansetron (ZOFRAN) IV, oxyCODONE, phenol, sodium chloride, sodium chloride flush, traMADol   Vital Signs    Vitals:   04/29/20 1546 04/29/20 2011 04/30/20 0419 04/30/20 0733  BP: (!) 133/47 (!) 150/66 (!) 144/57 (!) 136/51  Pulse: 66 70 78 70  Resp: 16 18 18 18   Temp: 98.3 F (36.8 C) 97.7 F (36.5 C) 98.1 F (36.7 C) (!) 97.4 F (36.3 C)  TempSrc:    Oral  SpO2: 99% 97% 99% 97%  Weight:   (!) 127.3 kg   Height:        Intake/Output Summary (Last 24 hours) at 04/30/2020 0949 Last data filed at 04/30/2020 0734 Gross per 24 hour  Intake --  Output 1500 ml  Net -1500 ml   Last 3 Weights 04/30/2020 04/29/2020 04/28/2020  Weight (lbs) 280 lb 9.6 oz 287 lb 0.6 oz 288 lb 9.6 oz  Weight (kg) 127.279 kg 130.2 kg 130.908 kg  Some encounter information is  confidential and restricted. Go to Review Flowsheets activity to see all data.      Telemetry    Sinus with PVC and couplet- Personally Reviewed   Physical Exam   GEN: No acute distress.   Neck: Positive JVD Cardiac: RRR, 2/6 systolic murmur Respiratory: Mild rhonchi GI: Soft, nontender, non-distended  MS: 2+ edema; wrapped Neuro:  Nonfocal  Psych: Normal affect   Labs    High Sensitivity Troponin:   Recent Labs  Lab 04/23/20 1525 04/23/20 1854  TROPONINIHS 18* 578*      Chemistry Recent Labs  Lab 04/23/20 1506 04/24/20 0607 04/28/20 0530 04/29/20 0439 04/30/20 0443  NA 141   < > 137 137 131*  K 3.5   < > 3.6 3.6 3.6  CL 104   < > 98 98 93*  CO2 24   < > 30 26 27   GLUCOSE 121*   < > 182* 175* 170*  BUN 63*   < > 73* 69* 67*  CREATININE 2.85*   < > 2.96* 2.91* 2.97*  CALCIUM 9.0   < > 8.6* 8.7* 8.8*  PROT 7.7  --   --   --   --   ALBUMIN 3.4*  --   --   --   --  AST 32  --   --   --   --   ALT 19  --   --   --   --   ALKPHOS 91  --   --   --   --   BILITOT 1.0  --   --   --   --   GFRNONAA 22*   < > 21* 21* 21*  GFRAA 25*   < > 24* 25* 24*  ANIONGAP 13   < > 9 13 11    < > = values in this interval not displayed.     Hematology Recent Labs  Lab 04/28/20 0530 04/28/20 0530 04/29/20 0439 04/29/20 1205 04/30/20 0443  WBC 4.7  --  4.7  --  4.6  RBC 2.39*  --  2.29*  --  2.26*  HGB 7.4*   < > 7.0* 7.9* 6.7*  HCT 21.9*   < > 21.0* 23.4* 21.2*  MCV 91.6  --  91.7  --  93.8  MCH 31.0  --  30.6  --  29.6  MCHC 33.8  --  33.3  --  31.6  RDW 16.3*  --  16.3*  --  16.2*  PLT 121*  --  112*  --  111*   < > = values in this interval not displayed.    BNP Recent Labs  Lab 04/23/20 1525  BNP 376.4*     Patient Profile     69 y.o.malewith history of HFpEF, permanent Afib not on [place], CKD stage IV, cirrhosis complicated by portal hypertension, DM2, CKD stage IV, HTN, HLD, chronic anemia, obesity, and OSA not on CPAP,who was admitted 7/24 due to  worsening volume overload in the setting ofHFpEF. Echocardiogram this admission shows ejection fraction 50 to 55%, mild left ventricular enlargement, mild left ventricular hypertrophy, biatrial enlargement, mild to moderate aortic stenosis with mean gradient 14 mmHg and aortic valve area 1.4 cm.  Assessment & Plan    1 acute on chronic diastolic congestive heart failure-I/O-1300; weight-127.3 kg.  Patient remains volume overloaded on examination.  We will continue Lasix drip today and likely transition to oral Demadex tomorrow.  Continue spironolactone at present dose.  Follow renal function and potassium closely.  2 paroxysmal atrial fibrillation-patient remains in sinus rhythm.  He is not on anticoagulation due to cirrhosis/portal hypertension as well as anemia.  He is not on AV nodal blocking agents due to baseline first-degree AV block and bradycardia.  3 elevated troponin-occurring in the setting of CHF and renal insufficiency.  Patient denies chest pain.  No plans for further ischemia evaluation.  4 chronic stage IV kidney disease-patient is being followed by nephrology.  Follow renal function closely with diuresis.  BUN and creatinine unchanged today.  5 anemia-transfusion per primary care.  For questions or updates, please contact [place] Please consult [email or web address] for contact info under        Signed, Kirk Ruths, MD  04/30/2020, 9:49 AM

## 2020-04-30 NOTE — Progress Notes (Addendum)
PROGRESS NOTE    [name]   ZOX:[id number]  DOB: Apr 06, 1951  PCP: Leonel Ramsay, MD    DOA: 04/23/2020 LOS: 7   Brief Narrative   Corey West is a 69 y.o. 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  He has also been having diarrhea this because he takes lactulose for his "ammonia".   He was febrile (101.8), tachycardic lactic acid was elevated at 3.0.  He was admitted to the hospital for sepsis and acute on chronic diastolic CHF with fluid overload.     Assessment & Plan   Principal Problem:   Sepsis ([place]) Active Problems:   Iron deficiency anemia   Type 2 diabetes mellitus with diabetic nephropathy, with long-term current use of insulin (HCC)   CKD (chronic kidney disease) stage 4, GFR 15-29 ml/min (HCC)   Obesity, Class III, BMI 40-49.9 (morbid obesity) (HCC)   Cirrhosis (HCC)   Esophageal varices (HCC)   Chronic venous stasis dermatitis of both lower extremities   Acute on chronic diastolic CHF (congestive heart failure) (HCC)   Acute hypoxemic respiratory failure (HCC)   Paroxysmal atrial fibrillation (HCC)   COPD with chronic bronchitis (HCC)   Bacteremia due to Klebsiella pneumoniae   Palliative care by specialist   DNR (do not resuscitate) discussion   Dyspnea /tremors -on rounds this morning patient appeared very short of breath and was having intermittent shaking, reported feeling quite anxious.  Had been given Ativan only 5 minutes earlier.  Recommended he have nurse contact me if  not improving.  Will check EKG, troponin and chest x-ray.  Sepsis secondary to klebsiella pneumonia bacteremia:  Continue IV Ancef.  Day 6 of antibiotics.  Acute on chronic diastolic CHF/anasarca: Remains volume overloaded with edema to mid/proximal thighs, although slightly improved today after Lasix drip resumed yesterday.  Continue Lasix infusion today.   Diuresis per cardiology and nephrology.   Monitor BMP, daily weights and urine output.   2D echo on 04/23/2020 showed EF estimated at 50 to 55%, mild LVH, grade 2 diastolic dysfunction.    Will need very close follow up in HF clinic or cardiologist.    Liver cirrhosis with anasarca, history of esophageal varices: Continue diuretics, rifaximin and lactulose.  Elevated troponin: This is likely due to demand ischemia.  Patient has been evaluated by cardiologist.  AKI on CKD stage IV: Creatinine slightly improved today to 2.86 from 3.  AKI is likely secondary to cardiorenal syndrome as it is improving with diuresis.  Monitor BMP.    Nephrology is following.  Chronic iron deficiency anemia -received iron infusion yesterday.  Yesterday morning hemoglobin 7.0, afternoon 7.9, this morning 6.7.  Transfuse 1 unit RBCs today.  CBC daily.  Monitor closely for signs of bleeding.  Continue oral iron supplement  Insulin-dependent diabetes mellitus, episode of hypoglycemia on 04/23/2020: NovoLog as needed for hyperglycemia  Hypertension: Continue antihypertensives.  Stopped amlodipine given his edema, increased hydralazine.   Hypokalemia: Improving.    Daily BMP to monitor, replace as needed.  Paroxysmal atrial fibrillation: Not on anticoagulation because of liver disease, kidney disease, thrombocytopenia and chronic anemia.  Monitor on telemetry.  Chronic venous stasis of bilateral lower extremities: Continue leg wraps   Goals of care: Consulted palliative care because of patient's multiple comorbidities including CHF, advanced kidney  disease, liver cirrhosis with anasarca.  Patient met with palliative, continue full scope of care, full code.  Set some time limit for intubation.  Niece would be surrogate decision maker.  Declines to go to SNF, thinks he can manage fine at home on his own.  Agreeable to dialysis if needed.     Morbid obesity: Body mass index is 40.26 kg/m.  Complicates overall care and prognosis  DVT prophylaxis: Place and maintain sequential compression device Start: 04/30/20 1101   Diet:  Diet Orders (From admission, onward)    Start     Ordered   04/23/20 1934  Diet heart healthy/carb modified Room service appropriate? Yes; Fluid consistency: Thin  Diet effective now       Question Answer Comment  Diet-HS Snack? Nothing   Room service appropriate? Yes   Fluid consistency: Thin      04/23/20 1936            Code Status: Full Code    Subjective 04/30/20    Patient seen up in chair this morning.  No acute events reported overnight.  He is visibly short of breath with conversational dyspnea, worried appearance on his face.  States he has been feeling quite anxious, also having tremors in his arms and hands.  Reports some very mild chest discomfort earlier this morning that resolved.  Says his swelling feels about the same may be slightly better today.   Disposition Plan & Communication   Status is: Inpatient  Remains inpatient appropriate because:IV treatments appropriate due to intensity of illness or inability to take PO   Dispo: The patient is from: Home              Anticipated d/c is to: Home with [facility], declines SNF              Anticipated d/c date is: 3 days              Patient currently is not medically stable to d/c.   Family Communication: None at bedside, will attempt to call niece.     Consults, Procedures, Significant Events   Consultants:   Cardiology  Nephrology  Procedures:   None  Antimicrobials:   Ancef   Objective   Vitals:   04/30/20 1720  04/30/20 1820 04/30/20 2006 04/30/20 2008  BP: (!) 128/50 (!) 132/62  (!) 128/44  Pulse: 80 77 74 69  Resp: '18 20 20   '$ Temp: 98.1 F (36.7 C) 98.5 F (36.9 C) 98 F (36.7 C)   TempSrc:  Oral Oral   SpO2: 99% 100% 100%   Weight:      Height:        Intake/Output Summary (Last 24 hours) at 04/30/2020 2118 Last data filed at 04/30/2020 2008 Gross per 24 hour  Intake 1000 ml  Output 1800 ml  Net -800 ml   Filed Weights   04/28/20 0408 04/29/20 0428 04/30/20 0419  Weight: (!) 130.9 kg (!) 130.2 kg (!) 127.3 kg    Physical Exam:  General exam: awake, alert, moderate distress, morbidly obese Respiratory system: Dyspneic, CTAB but diminished due to body habitus, conversational dyspnea, increased work of breathing Cardiovascular system: normal S1/S2, RRR, continues to have pitting edema proximal to wraps to mid thighs mildly improved today.   Extremities: moves all, bilateral  lower extremities in wraps with proximal pitting edema which is slightly improved today Psychiatry: Anxious mood, congruent affect  Labs   Data Reviewed: I have personally reviewed following labs and imaging studies  CBC: Recent Labs  Lab 04/28/20 0530 04/29/20 0439 04/29/20 1205 04/30/20 0443  WBC 4.7 4.7  --  4.6  HGB 7.4* 7.0* 7.9* 6.7*  HCT 21.9* 21.0* 23.4* 21.2*  MCV 91.6 91.7  --  93.8  PLT 121* 112*  --  828*   Basic Metabolic Panel: Recent Labs  Lab 04/26/20 0526 04/27/20 0256 04/28/20 0530 04/29/20 0439 04/30/20 0443  NA 138 136 137 137 131*  K 3.3* 3.4* 3.6 3.6 3.6  CL 101 97* 98 98 93*  CO2 '27 28 30 26 27  '$ GLUCOSE 155* 214* 182* 175* 170*  BUN 74* 78* 73* 69* 67*  CREATININE 3.14* 2.86* 2.96* 2.91* 2.97*  CALCIUM 8.7* 8.5* 8.6* 8.7* 8.8*  MG 1.7  --  1.6* 1.7 1.7   GFR: Estimated Creatinine Clearance: 31.9 mL/min (A) (by C-G formula based on SCr of 2.97 mg/dL (H)). Liver Function Tests: No results for input(s): AST, ALT, ALKPHOS, BILITOT, PROT, ALBUMIN in the last 168  hours. No results for input(s): LIPASE, AMYLASE in the last 168 hours. No results for input(s): AMMONIA in the last 168 hours. Coagulation Profile: No results for input(s): INR, PROTIME in the last 168 hours. Cardiac Enzymes: No results for input(s): CKTOTAL, CKMB, CKMBINDEX, TROPONINI in the last 168 hours. BNP (last 3 results) No results for input(s): PROBNP in the last 8760 hours. HbA1C: No results for input(s): HGBA1C in the last 72 hours. CBG: Recent Labs  Lab 04/29/20 1545 04/29/20 2148 04/30/20 0733 04/30/20 1206 04/30/20 1616  GLUCAP 249* 211* 143* 181* 198*   Lipid Profile: No results for input(s): CHOL, HDL, LDLCALC, TRIG, CHOLHDL, LDLDIRECT in the last 72 hours. Thyroid Function Tests: No results for input(s): TSH, T4TOTAL, FREET4, T3FREE, THYROIDAB in the last 72 hours. Anemia Panel: Recent Labs    04/28/20 0530  FERRITIN 35  TIBC 286  IRON 34*   Sepsis Labs: Recent Labs  Lab 04/23/20 2142  LATICACIDVEN 1.3    Recent Results (from the past 240 hour(s))  Blood Culture (routine x 2)     Status: None   Collection Time: 04/23/20  3:06 PM   Specimen: BLOOD  Result Value Ref Range Status   Specimen Description BLOOD RAC  Final   Special Requests   Final    BOTTLES DRAWN AEROBIC AND ANAEROBIC Blood Culture results may not be optimal due to an inadequate volume of blood received in culture bottles   Culture   Final    NO GROWTH 5 DAYS Performed at [facility], [address] [place], [place] [address]    Report Status 04/28/2020 FINAL  Final  Blood Culture (routine x 2)     Status: Abnormal   Collection Time: 04/23/20  3:06 PM   Specimen: BLOOD RIGHT FOREARM  Result Value Ref Range Status   Specimen Description   Final    BLOOD RIGHT FOREARM Performed at [facility], [place] [address], [place], [place] [address]    Special Requests   Final    BOTTLES DRAWN AEROBIC AND ANAEROBIC Blood Culture adequate volume Performed at [place] [place], [place] [address]    Culture  Setup Time   Final    GRAM NEGATIVE RODS IN BOTH AEROBIC AND ANAEROBIC BOTTLES CRITICAL RESULT CALLED TO,  READ BACK BY AND VERIFIED WITH: SCOTT HALL 04/24/20 AT 0618 HS Performed at [facility], [address] [place], [place] [address]    Culture KLEBSIELLA PNEUMONIAE (A)  Final   Report Status 04/26/2020 FINAL  Final   Organism ID, Bacteria KLEBSIELLA PNEUMONIAE  Final      Susceptibility   Klebsiella pneumoniae - MIC*    AMPICILLIN >=32 RESISTANT Resistant     CEFAZOLIN <=4 SENSITIVE Sensitive     CEFEPIME <=0.12 SENSITIVE Sensitive     CEFTAZIDIME <=1 SENSITIVE Sensitive     CEFTRIAXONE <=0.25 SENSITIVE Sensitive     CIPROFLOXACIN <=0.25 SENSITIVE Sensitive     GENTAMICIN <=1 SENSITIVE Sensitive     IMIPENEM <=0.25 SENSITIVE Sensitive     TRIMETH/SULFA <=20 SENSITIVE Sensitive     AMPICILLIN/SULBACTAM >=32 RESISTANT Resistant     PIP/TAZO <=4 SENSITIVE Sensitive     * KLEBSIELLA PNEUMONIAE  Blood Culture ID Panel (Reflexed)     Status: Abnormal   Collection Time: 04/23/20  3:06 PM  Result Value Ref Range Status   Enterococcus species NOT DETECTED NOT DETECTED Final   Listeria monocytogenes NOT DETECTED NOT DETECTED Final   Staphylococcus species NOT DETECTED NOT DETECTED Final   Staphylococcus aureus (BCID) NOT DETECTED NOT DETECTED Final   Streptococcus species NOT DETECTED NOT DETECTED Final   Streptococcus agalactiae NOT DETECTED NOT DETECTED Final   Streptococcus pneumoniae NOT DETECTED NOT DETECTED Final   Streptococcus pyogenes NOT DETECTED NOT DETECTED Final   Acinetobacter baumannii NOT DETECTED NOT DETECTED Final   Enterobacteriaceae species DETECTED (A) NOT DETECTED Final    Comment: Enterobacteriaceae represent a large family of gram-negative bacteria, not a single organism. CRITICAL RESULT CALLED TO, READ BACK BY AND VERIFIED WITH: SCOTT HALL 04/24/20 AT 0618 HS    Enterobacter  cloacae complex NOT DETECTED NOT DETECTED Final   Escherichia coli NOT DETECTED NOT DETECTED Final   Klebsiella oxytoca NOT DETECTED NOT DETECTED Final   Klebsiella pneumoniae DETECTED (A) NOT DETECTED Final    Comment: CRITICAL RESULT CALLED TO, READ BACK BY AND VERIFIED WITH: SCOTT HALL 04/24/20 AT 0618 HS    Proteus species NOT DETECTED NOT DETECTED Final   Serratia marcescens NOT DETECTED NOT DETECTED Final   Carbapenem resistance NOT DETECTED NOT DETECTED Final   Haemophilus influenzae NOT DETECTED NOT DETECTED Final   Neisseria meningitidis NOT DETECTED NOT DETECTED Final   Pseudomonas aeruginosa NOT DETECTED NOT DETECTED Final   Candida albicans NOT DETECTED NOT DETECTED Final   Candida glabrata NOT DETECTED NOT DETECTED Final   Candida krusei NOT DETECTED NOT DETECTED Final   Candida parapsilosis NOT DETECTED NOT DETECTED Final   Candida tropicalis NOT DETECTED NOT DETECTED Final    Comment: Performed at [facility], [address] [place], [place] [address]  SARS Coronavirus 2 by RT PCR (hospital order, performed in [facility] hospital lab)     Status: None   Collection Time: 04/23/20  3:46 PM  Result Value Ref Range Status   SARS Coronavirus 2 NEGATIVE NEGATIVE Final    Comment: (NOTE) SARS-CoV-2 target nucleic acids are NOT DETECTED.  The SARS-CoV-2 RNA is generally detectable in upper and lower respiratory specimens during the acute phase of infection. The lowest concentration of SARS-CoV-2 viral copies this assay can detect is 250 copies / mL. A negative result does not preclude SARS-CoV-2 infection and should not be used as the sole basis for treatment or other patient management decisions.  A negative result may  occur with improper specimen collection / handling, submission of specimen other than nasopharyngeal swab, presence of viral mutation(s) within the areas targeted by this assay, and inadequate number of viral copies (<250 copies / mL). A negative  result must be combined with clinical observations, patient history, and epidemiological information.  Fact Sheet for Patients:   StrictlyIdeas.no  Fact Sheet for Healthcare Providers: BankingDealers.co.za  This test is not yet approved or  cleared by the [place] FDA and has been authorized for detection and/or diagnosis of SARS-CoV-2 by FDA under an Emergency Use Authorization (EUA).  This EUA will remain in effect (meaning this test can be used) for the duration of the COVID-19 declaration under Section 564(b)(1) of the Act, 21 U.S.C. section 360bbb-3(b)(1), unless the authorization is terminated or revoked sooner.  Performed at [facility], [address] [place], [place] [address]       Imaging Studies   DG Chest [place] 1 View  Result Date: 04/30/2020 CLINICAL DATA:  Shortness of breath. EXAM: PORTABLE CHEST 1 VIEW COMPARISON:  Radiograph 04/23/2020 FINDINGS: Stable cardiomegaly. Unchanged mediastinal contours. Vascular congestion which appears similar to prior exam. No acute or focal airspace disease. No pleural fluid or pneumothorax. IMPRESSION: Stable cardiomegaly and vascular congestion. No new abnormality. Electronically Signed   By: Keith Rake M.D.   On: 04/30/2020 16:46     Medications   Scheduled Meds: . sodium chloride   Intravenous Once  . atorvastatin  20 mg Oral Daily  . [START ON 05/03/2020] darbepoetin (ARANESP) injection - NON-DIALYSIS  100 mcg Subcutaneous Q Tue-1800  . ferrous sulfate  325 mg Oral Daily  . hydrALAZINE  50 mg Oral BID  . insulin aspart  0-20 Units Subcutaneous TID WC  . insulin aspart  0-5 Units Subcutaneous QHS  . lactulose  60 g Oral Daily  . losartan  25 mg Oral Daily  . pantoprazole  40 mg Oral BID  . potassium chloride  40 mEq Oral Daily  . rifaximin  550 mg Oral BID  . sodium chloride flush  3 mL Intravenous Q12H  . spironolactone  25 mg Oral Daily   Continuous  Infusions: . sodium chloride    . albumin human 12.5 g (04/30/20 2031)  .  ceFAZolin (ANCEF) IV 2 g (04/30/20 1804)  . furosemide (LASIX) infusion 8 mg/hr (04/29/20 1058)       LOS: 7 days    Time spent: 30 minutes with greater than 50% spent in coordination of care and direct patient contact    Ezekiel Slocumb, DO [facility]  04/30/2020, 9:18 PM    If 7PM-7AM, please contact night-coverage. How to contact the [facility] Attending or Consulting provider [place] or covering provider during after hours [place], for this patient?    1. Check the care team in [facility] and look for a) attending/consulting [facility] provider listed and b) the [facility] team listed 2. Log into [email or web address] and use [place]'s universal password to access. If you do not have the password, please contact the hospital operator. 3. Locate the [facility] provider you are looking for under [facility] and page to a number that you can be directly reached. 4. If you still have difficulty reaching the provider, please page the [facility] (Director on Call) for the Hospitalists listed on amion for assistance.

## 2020-04-30 NOTE — Progress Notes (Signed)
Corey West  MRN: [id number]  DOB/AGE: [birth year] 69 y.o.  Primary Care Physician:Fitzgerald, Cheral Marker, MD  Admit date: 04/23/2020  Chief Complaint:  Chief Complaint  Patient presents with  . Shortness of Breath    S-Pt presented on  04/23/2020 with  Chief Complaint  Patient presents with  . Shortness of Breath  .    Pt c/o dyspnea,   Two RN's  present in the room ready to give blood .   Medications  . sodium chloride   Intravenous Once  . atorvastatin  20 mg Oral Daily  . [START ON 05/03/2020] darbepoetin (ARANESP) injection - NON-DIALYSIS  100 mcg Subcutaneous Q Tue-1800  . ferrous sulfate  325 mg Oral Daily  . hydrALAZINE  50 mg Oral BID  . insulin aspart  0-20 Units Subcutaneous TID WC  . insulin aspart  0-5 Units Subcutaneous QHS  . lactulose  60 g Oral Daily  . losartan  25 mg Oral Daily  . pantoprazole  40 mg Oral BID  . potassium chloride  40 mEq Oral Daily  . rifaximin  550 mg Oral BID  . sodium chloride flush  3 mL Intravenous Q12H  . spironolactone  25 mg Oral Daily         QQV:ZDGLO from the symptoms mentioned above,there are no other symptoms referable to all systems reviewed.  Physical Exam: Vital signs in last 24 hours: Temp:  [97.4 F (36.3 C)-98.3 F (36.8 C)] 97.7 F (36.5 C) (07/31 1206) Pulse Rate:  [66-78] 72 (07/31 1206) Resp:  [16-18] 18 (07/31 0733) BP: (125-150)/(46-66) 125/46 (07/31 1206) SpO2:  [97 %-99 %] 99 % (07/31 1206) Weight:  [127.3 kg] 127.3 kg (07/31 0419) Weight change: -2.921 kg Last BM Date: 04/29/20  Intake/Output from previous day: 07/30 0701 - 07/31 0700 In: -  Out: 1300 [Urine:1300] Total I/O In: -  Out: 200 [Urine:200]   Physical Exam: General- pt is awake,alert, oriented to time place and person Resp- No acute REsp distress, Rhonchi+ CVS- S1S2 regular in rate and rhythm GIT- BS+, soft, NT, ND EXT- B/l bandages on lower ext, NO cyanosis   Lab Results: CBC Recent Labs    04/29/20 0439  04/29/20 0439 04/29/20 1205 04/30/20 0443  WBC 4.7  --   --  4.6  HGB 7.0*   < > 7.9* 6.7*  HCT 21.0*   < > 23.4* 21.2*  PLT 112*  --   --  111*   < > = values in this interval not displayed.    BMET Recent Labs    04/29/20 0439 04/30/20 0443  NA 137 131*  K 3.6 3.6  CL 98 93*  CO2 26 27  GLUCOSE 175* 170*  BUN 69* 67*  CREATININE 2.91* 2.97*  CALCIUM 8.7* 8.8*    Creatinine trend 2021 2.8--3.0 During March/May admission 2.4--3.3 Earlier 2.4--2.7 baseline 2020 1.7--2.4 2019 1.6--2.4 2018 1.9--2.3 2017 1.9 2016 2.0--2.4 2015 1.8--2.6 2014 1.8  MICRO Recent Results (from the past 240 hour(s))  Blood Culture (routine x 2)     Status: None   Collection Time: 04/23/20  3:06 PM   Specimen: BLOOD  Result Value Ref Range Status   Specimen Description BLOOD RAC  Final   Special Requests   Final    BOTTLES DRAWN AEROBIC AND ANAEROBIC Blood Culture results may not be optimal due to an inadequate volume of blood received in culture bottles   Culture   Final    NO GROWTH 5 DAYS Performed at  [facility], [address] [place], [place] [address]    Report Status 04/28/2020 FINAL  Final  Blood Culture (routine x 2)     Status: Abnormal   Collection Time: 04/23/20  3:06 PM   Specimen: BLOOD RIGHT FOREARM  Result Value Ref Range Status   Specimen Description   Final    BLOOD RIGHT FOREARM Performed at [facility], [place] [address] [place], [place] [address]    Special Requests   Final    BOTTLES DRAWN AEROBIC AND ANAEROBIC Blood Culture adequate volume Performed at [facility], [address] [place], [place] [address]    Culture  Setup Time   Final    GRAM NEGATIVE RODS IN BOTH AEROBIC AND ANAEROBIC BOTTLES CRITICAL RESULT CALLED TO, READ BACK BY AND VERIFIED WITH: SCOTT HALL 04/24/20 AT 0618 HS Performed at [facility], [address] [place], [place] [address]    Culture KLEBSIELLA PNEUMONIAE (A)  Final   Report Status  04/26/2020 FINAL  Final   Organism ID, Bacteria KLEBSIELLA PNEUMONIAE  Final      Susceptibility   Klebsiella pneumoniae - MIC*    AMPICILLIN >=32 RESISTANT Resistant     CEFAZOLIN <=4 SENSITIVE Sensitive     CEFEPIME <=0.12 SENSITIVE Sensitive     CEFTAZIDIME <=1 SENSITIVE Sensitive     CEFTRIAXONE <=0.25 SENSITIVE Sensitive     CIPROFLOXACIN <=0.25 SENSITIVE Sensitive     GENTAMICIN <=1 SENSITIVE Sensitive     IMIPENEM <=0.25 SENSITIVE Sensitive     TRIMETH/SULFA <=20 SENSITIVE Sensitive     AMPICILLIN/SULBACTAM >=32 RESISTANT Resistant     PIP/TAZO <=4 SENSITIVE Sensitive     * KLEBSIELLA PNEUMONIAE  Blood Culture ID Panel (Reflexed)     Status: Abnormal   Collection Time: 04/23/20  3:06 PM  Result Value Ref Range Status   Enterococcus species NOT DETECTED NOT DETECTED Final   Listeria monocytogenes NOT DETECTED NOT DETECTED Final   Staphylococcus species NOT DETECTED NOT DETECTED Final   Staphylococcus aureus (BCID) NOT DETECTED NOT DETECTED Final   Streptococcus species NOT DETECTED NOT DETECTED Final   Streptococcus agalactiae NOT DETECTED NOT DETECTED Final   Streptococcus pneumoniae NOT DETECTED NOT DETECTED Final   Streptococcus pyogenes NOT DETECTED NOT DETECTED Final   Acinetobacter baumannii NOT DETECTED NOT DETECTED Final   Enterobacteriaceae species DETECTED (A) NOT DETECTED Final    Comment: Enterobacteriaceae represent a large family of gram-negative bacteria, not a single organism. CRITICAL RESULT CALLED TO, READ BACK BY AND VERIFIED WITH: SCOTT HALL 04/24/20 AT 0618 HS    Enterobacter cloacae complex NOT DETECTED NOT DETECTED Final   Escherichia coli NOT DETECTED NOT DETECTED Final   Klebsiella oxytoca NOT DETECTED NOT DETECTED Final   Klebsiella pneumoniae DETECTED (A) NOT DETECTED Final    Comment: CRITICAL RESULT CALLED TO, READ BACK BY AND VERIFIED WITH: SCOTT HALL 04/24/20 AT 0618 HS    Proteus species NOT DETECTED NOT DETECTED Final   Serratia marcescens  NOT DETECTED NOT DETECTED Final   Carbapenem resistance NOT DETECTED NOT DETECTED Final   Haemophilus influenzae NOT DETECTED NOT DETECTED Final   Neisseria meningitidis NOT DETECTED NOT DETECTED Final   Pseudomonas aeruginosa NOT DETECTED NOT DETECTED Final   Candida albicans NOT DETECTED NOT DETECTED Final   Candida glabrata NOT DETECTED NOT DETECTED Final   Candida krusei NOT DETECTED NOT DETECTED Final   Candida parapsilosis NOT DETECTED NOT DETECTED Final   Candida tropicalis NOT DETECTED NOT DETECTED  Final    Comment: Performed at [facility], [address] [place], [place] [address]  SARS Coronavirus 2 by RT PCR (hospital order, performed in [place] hospital lab)     Status: None   Collection Time: 04/23/20  3:46 PM  Result Value Ref Range Status   SARS Coronavirus 2 NEGATIVE NEGATIVE Final    Comment: (NOTE) SARS-CoV-2 target nucleic acids are NOT DETECTED.  The SARS-CoV-2 RNA is generally detectable in upper and lower respiratory specimens during the acute phase of infection. The lowest concentration of SARS-CoV-2 viral copies this assay can detect is 250 copies / mL. A negative result does not preclude SARS-CoV-2 infection and should not be used as the sole basis for treatment or other patient management decisions.  A negative result may occur with improper specimen collection / handling, submission of specimen other than nasopharyngeal swab, presence of viral mutation(s) within the areas targeted by this assay, and inadequate number of viral copies (<250 copies / mL). A negative result must be combined with clinical observations, patient history, and epidemiological information.  Fact Sheet for Patients:   StrictlyIdeas.no  Fact Sheet for Healthcare Providers: BankingDealers.co.za  This test is not yet approved or  cleared by the [place] FDA and has been authorized for detection and/or diagnosis of  SARS-CoV-2 by FDA under an Emergency Use Authorization (EUA).  This EUA will remain in effect (meaning this test can be used) for the duration of the COVID-19 declaration under Section 564(b)(1) of the Act, 21 U.S.C. section 360bbb-3(b)(1), unless the authorization is terminated or revoked sooner.  Performed at [facility], [address] [place],  [address]       Lab Results  Component Value Date   PTH 48 01/30/2020   CALCIUM 8.8 (L) 04/30/2020   PHOS 3.2 01/30/2020   Albumin 3.4 Corrected calcium is 8.8+0.3 with a goal of 91            Impression:   Patient is a 69 year old Caucasian male with a past medical history of liver cirrhosis secondary to [facility] and alcohol, obstructive sleep apnea, hypertension, diabetes mellitus type 2, Barrett's disease, history of G AVE syndrome, chronic venous stasis dermatitis Who was admitted on July 24 with chief complaint of acute on chronic diastolic CHF, CKD, acute hypoxic respiratory failure, proximal atrial fibrillation, COPD with bronchitis, bacteremia due to Klebsiella pneumonia  1)Renal  AKI secondary to cardiorenal syndrome Patient has AKI on CKD Patient has CKD stage IV Patient has CKD since 2014 Patient has CKD secondary to diabetes mellitus There is possible contribution from cardiorenal syndrome/hypertension/obesity related glomerulopathy.  AKI is now better Creatinine is now back to new baseline most likely  2)HTN Blood pressure is stable   3)Anemia of chronic disease  HGb is not at goal (9--11)  Pt to receive PRBC   4) hypocalcemia Patient calcium is normal when corrected for low albumin   5) volume overload Patient was admitted with volume overload Patient is negative by around 6 L  6) electrolytes   sodium Hyponatremia secondary to hypervolemia  potassium Hypokalemia, now better    7)Acid base  Co2 at goal     Plan:    We will continue current  treatment    Corey West s Theador Hawthorne 04/30/2020, 12:26 PM

## 2020-05-01 DIAGNOSIS — I5033 Acute on chronic diastolic (congestive) heart failure: Secondary | ICD-10-CM | POA: Diagnosis not present

## 2020-05-01 LAB — GLUCOSE, CAPILLARY
Glucose-Capillary: 158 mg/dL — ABNORMAL HIGH (ref 70–99)
Glucose-Capillary: 193 mg/dL — ABNORMAL HIGH (ref 70–99)
Glucose-Capillary: 256 mg/dL — ABNORMAL HIGH (ref 70–99)
Glucose-Capillary: 192 mg/dL — ABNORMAL HIGH (ref 70–99)

## 2020-05-01 LAB — URINALYSIS, COMPLETE (UACMP) WITH MICROSCOPIC
Bacteria, UA: NONE SEEN
Bilirubin Urine: NEGATIVE
Glucose, UA: NEGATIVE mg/dL
Ketones, ur: NEGATIVE mg/dL
Leukocytes,Ua: NEGATIVE
Nitrite: NEGATIVE
Protein, ur: 100 mg/dL — AB
Specific Gravity, Urine: 1.01 (ref 1.005–1.030)
pH: 6 (ref 5.0–8.0)

## 2020-05-01 LAB — BPAM RBC
Blood Product Expiration Date: 202108022359
ISSUE DATE / TIME: 202107311453
Unit Type and Rh: 9500

## 2020-05-01 LAB — CBC
HCT: 22.8 % — ABNORMAL LOW (ref 39.0–52.0)
Hemoglobin: 7.7 g/dL — ABNORMAL LOW (ref 13.0–17.0)
MCH: 30.6 pg (ref 26.0–34.0)
MCHC: 33.8 g/dL (ref 30.0–36.0)
MCV: 90.5 fL (ref 80.0–100.0)
Platelets: 103 10*3/uL — ABNORMAL LOW (ref 150–400)
RBC: 2.52 MIL/uL — ABNORMAL LOW (ref 4.22–5.81)
RDW: 16.5 % — ABNORMAL HIGH (ref 11.5–15.5)
WBC: 6.4 10*3/uL (ref 4.0–10.5)
nRBC: 0 % (ref 0.0–0.2)

## 2020-05-01 LAB — BASIC METABOLIC PANEL
Anion gap: 12 (ref 5–15)
BUN: 70 mg/dL — ABNORMAL HIGH (ref 8–23)
CO2: 26 mmol/L (ref 22–32)
Calcium: 8.6 mg/dL — ABNORMAL LOW (ref 8.9–10.3)
Chloride: 96 mmol/L — ABNORMAL LOW (ref 98–111)
Creatinine, Ser: 3.43 mg/dL — ABNORMAL HIGH (ref 0.61–1.24)
GFR calc Af Amer: 20 mL/min — ABNORMAL LOW (ref >60)
GFR calc non Af Amer: 17 mL/min — ABNORMAL LOW (ref >60)
Glucose, Bld: 154 mg/dL — ABNORMAL HIGH (ref 70–99)
Potassium: 4.1 mmol/L (ref 3.5–5.1)
Sodium: 134 mmol/L — ABNORMAL LOW (ref 135–145)

## 2020-05-01 LAB — TYPE AND SCREEN
ABO/RH(D): A NEG
Antibody Screen: NEGATIVE
Unit division: 0

## 2020-05-01 MED ORDER — CEFAZOLIN SODIUM-DEXTROSE 2-4 GM/100ML-% IV SOLN
2.0000 g | Freq: Two times a day (BID) | INTRAVENOUS | Status: DC
  Administered 2020-05-01 – 2020-05-02 (×2): 2 g via INTRAVENOUS
  Filled 2020-05-01 (×4): qty 100

## 2020-05-01 MED ORDER — TAMSULOSIN HCL 0.4 MG PO CAPS
0.4000 mg | ORAL_CAPSULE | Freq: Every day | ORAL | Status: DC
  Administered 2020-05-01 – 2020-05-02 (×2): 0.4 mg via ORAL
  Filled 2020-05-01 (×2): qty 1

## 2020-05-01 NOTE — Progress Notes (Addendum)
PROGRESS NOTE    Corey West   XLK:[id number]  DOB: March 04, 1951  PCP: Corey Ramsay, MD    DOA: 04/23/2020 LOS: 8   Brief Narrative   Corey West is a 69 y.o. 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  He has also been having diarrhea this because he takes lactulose for his "ammonia".   He was febrile (101.8), tachycardic lactic acid was elevated at 3.0.  He was admitted to the hospital for sepsis and acute on chronic diastolic CHF with fluid overload.     Assessment & Plan   Principal Problem:   Sepsis ([place]) Active Problems:   Iron deficiency anemia   Type 2 diabetes mellitus with diabetic nephropathy, with long-term current use of insulin (HCC)   CKD (chronic kidney disease) stage 4, GFR 15-29 ml/min (HCC)   Obesity, Class III, BMI 40-49.9 (morbid obesity) (HCC)   Cirrhosis (HCC)   Esophageal varices (HCC)   Chronic venous stasis dermatitis of both lower extremities   Acute on chronic diastolic CHF (congestive heart failure) (HCC)   Acute hypoxemic respiratory failure (HCC)   Paroxysmal atrial fibrillation (HCC)   COPD with chronic bronchitis (HCC)   Bacteremia due to Klebsiella pneumoniae   Palliative care by specialist   DNR (do not resuscitate) discussion  Dyspnea /tremors -Improved.  On rounds 7/31, patient very short of breath and was having intermittent shaking tremors, reported feeling quite anxious.  Had been given Ativan only 5 minutes earlier.  Troponin minimally elevated at 46>>44,  no chest pain.  EKG without ischemic changes.  Chest xray stable cardiomegaly and vascular congestion.  Dysuria - reported on 8/1.  UA is pending.    Sepsis secondary to klebsiella pneumonia bacteremia:  Continue IV Ancef.  Day 7 of antibiotics.  Acute on chronic diastolic CHF/anasarca: Remains volume overloaded with edema to mid/proximal thighs.  Lasix drip stopped this AM as creatinine bumped 2.97>>3.43.   Diuresis per cardiology and nephrology.  Diuresis, ARB and spironolactone on hold for worsening AKI. Monitor BMP, daily weights and urine output.   2D echo on 04/23/2020 showed EF estimated at 50 to 55%, mild LVH, grade 2 diastolic dysfunction.    Will need very close follow up in HF clinic and his cardiologist.    Liver cirrhosis with anasarca, history of esophageal varices: Continue diuretics per consultants, rifaximin and lactulose. Spironolactone held as of today given worsened renal function.  Elevated troponin: This is likely due to demand ischemia.  Patient has been evaluated by cardiologist.  AKI on CKD stage IV: Creatinine worsened 2.97>>3.43 after 2 days Lasix infusion and worsened anemia requiring transfusion.  AKI is likely secondary to cardiorenal syndrome as it is improving with diuresis.  PVR's requested to monitor for any retention causing post-renal azotemia.  Monitor BMP.    Nephrology is following.     Chronic iron deficiency anemia - received iron infusion 7/30, 1 unit RBC's transfused on 7/31 for Hbg 6.7.  This AM Hbg 7.7.   Monitor closely for signs of bleeding.  Continue oral iron supplement.  Insulin-dependent diabetes mellitus, episode of hypoglycemia on 04/23/2020: NovoLog  as needed for hyperglycemia  Hypertension: Continue antihypertensives.  Stopped amlodipine given his edema, increased hydralazine.   Hypokalemia: Improving.    Daily BMP to monitor, replace as needed.  Paroxysmal atrial fibrillation: Not on anticoagulation because of liver disease,  kidney disease, thrombocytopenia and chronic anemia.  Monitor on telemetry.  Chronic venous stasis of bilateral lower extremities: Continue leg wraps   Goals of care: Consulted palliative care because of patient's multiple comorbidities including CHF, advanced kidney disease, liver cirrhosis with anasarca.  Patient met with palliative, continue full scope of care, full code.  Set some time limit for intubation.  Niece would be surrogate decision maker.  Declines to go to SNF, thinks he can manage fine at home on his own.  Agreeable to dialysis if needed.     Morbid obesity: Body mass index is 40.26 kg/m.  Complicates overall care and prognosis  DVT prophylaxis: Place and maintain sequential compression device Start: 04/30/20 1101   Diet:  Diet Orders (From admission, onward)    Start     Ordered   04/23/20 1934  Diet heart healthy/carb modified Room service appropriate? Yes; Fluid consistency: Thin  Diet effective now       Question Answer Comment  Diet-HS Snack? Nothing   Room service appropriate? Yes   Fluid consistency: Thin      04/23/20 1936            Code Status: Full Code    Subjective 05/01/20    Patient seen up in chair this morning.  No acute events reported overnight.  He says he feels terrible, still very short of breath.  Says he is becoming progressively weak, felt unsteady when standing to void earlier.  Reports bilateral groin and thigh pain and intermittently winces in discomfort.  Denies fevers or chills, chest pain, nausea vomiting or other acute complaints at this time.  Of note, patient stated that he wanted no further lab draws as he is tired of being stuck with needles.  Advised patient we need lab information in order to make medical decisions for his care.     Disposition Plan & Communication   Status is: Inpatient  Remains inpatient appropriate because:IV treatments appropriate due to intensity of illness or inability to take PO   Dispo: The  patient is from: Home              Anticipated d/c is to: Home with [facility], declines SNF              Anticipated d/c date is: 3 days              Patient currently is not medically stable to d/c.   Family Communication: None at bedside, will attempt to call niece.     Consults, Procedures, Significant Events   Consultants:   Cardiology  Nephrology  Procedures:   None  Antimicrobials:   Ancef   Objective   Vitals:   04/30/20 2006 04/30/20 2008 05/01/20 0348 05/01/20 0749  BP:  (!) 128/44 (!) 122/43 (!) 135/48  Pulse: 74 69 78 72  Resp: _0 Temp: 98 F (36.7 C)  97.8 F (36.6 C) 98 F (36.7 C)  TempSrc: Oral     SpO2: 100%  100% 100%  Weight:      Height:        Intake/Output Summary (Last 24 hours) at 05/01/2020 3818 Last data filed at 04/30/2020 2008 Gross per 24 hour  Intake 1000 ml  Output  800 ml  Net 200 ml   Filed Weights   04/28/20 0408 04/29/20 0428 04/30/20 0419  Weight: (!) 130.9 kg (!) 130.2 kg (!) 127.3 kg    Physical Exam:  General exam: awake, alert, moderate distress, morbidly obese Respiratory system: Dyspneic, CTAB but diminished due to body habitus, conversational dyspnea, increased work of breathing Cardiovascular system: normal S1/S2, RRR,  to have pitting edema mid thighs seems about the same Gastrointestinal: Soft, nontender, positive bowel sounds Extremities: moves all, bilateral lower extremities in wraps with proximal pitting edema minimally improved, resolving erythema of being on the left medial thigh and bilateral groin folds Psychiatry: Anxious mood, congruent affect  Labs   Data Reviewed: I have personally reviewed following labs and imaging studies  CBC: Recent Labs  Lab 04/28/20 0530 04/29/20 0439 04/29/20 1205 04/30/20 0443 04/30/20 2105  WBC 4.7 4.7  --  4.6  --   HGB 7.4* 7.0* 7.9* 6.7* 7.5*  HCT 21.9* 21.0* 23.4* 21.2* 23.2*  MCV 91.6 91.7  --  93.8  --   PLT 121* 112*  --  111*  --    Basic Metabolic  Panel: Recent Labs  Lab 04/26/20 0526 04/26/20 0526 04/27/20 0256 04/28/20 0530 04/29/20 0439 04/30/20 0443 05/01/20 0557  NA 138   < > 136 137 137 131* 134*  K 3.3*   < > 3.4* 3.6 3.6 3.6 4.1  CL 101   < > 97* 98 98 93* 96*  CO2 27   < > _0 GLUCOSE 155*   < > 214* 182* 175* 170* 154*  BUN 74*   < > 78* 73* 69* 67* 70*  CREATININE 3.14*   < > 2.86* 2.96* 2.91* 2.97* 3.43*  CALCIUM 8.7*   < > 8.5* 8.6* 8.7* 8.8* 8.6*  MG 1.7  --   --  1.6* 1.7 1.7  --    < > = values in this interval not displayed.   GFR: Estimated Creatinine Clearance: 27.6 mL/min (A) (by C-G formula based on SCr of 3.43 mg/dL (H)). Liver Function Tests: No results for input(s): AST, ALT, ALKPHOS, BILITOT, PROT, ALBUMIN in the last 168 hours. No results for input(s): LIPASE, AMYLASE in the last 168 hours. No results for input(s): AMMONIA in the last 168 hours. Coagulation Profile: No results for input(s): INR, PROTIME in the last 168 hours. Cardiac Enzymes: No results for input(s): CKTOTAL, CKMB, CKMBINDEX, TROPONINI in the last 168 hours. BNP (last 3 results) No results for input(s): PROBNP in the last 8760 hours. HbA1C: No results for input(s): HGBA1C in the last 72 hours. CBG: Recent Labs  Lab 04/30/20 0733 04/30/20 1206 04/30/20 1616 04/30/20 2203 05/01/20 0751  GLUCAP 143* 181* 198* 203* 158*   Lipid Profile: No results for input(s): CHOL, HDL, LDLCALC, TRIG, CHOLHDL, LDLDIRECT in the last 72 hours. Thyroid Function Tests: No results for input(s): TSH, T4TOTAL, FREET4, T3FREE, THYROIDAB in the last 72 hours. Anemia Panel: No results for input(s): VITAMINB12, FOLATE, FERRITIN, TIBC, IRON, RETICCTPCT in the last 72 hours. Sepsis Labs: No results for input(s): PROCALCITON, LATICACIDVEN in the last 168 hours.  Recent Results (from the past 240 hour(s))  Blood Culture (routine x 2)     Status: None   Collection Time: 04/23/20  3:06 PM   Specimen: BLOOD  Result Value Ref Range  Status   Specimen Description BLOOD RAC  Final   Special Requests   Final    BOTTLES DRAWN AEROBIC AND ANAEROBIC Blood Culture  results may not be optimal due to an inadequate volume of blood received in culture bottles   Culture   Final    NO GROWTH 5 DAYS Performed at [facility], [address] [place], [place] [address]    Report Status 04/28/2020 FINAL  Final  Blood Culture (routine x 2)     Status: Abnormal   Collection Time: 04/23/20  3:06 PM   Specimen: BLOOD RIGHT FOREARM  Result Value Ref Range Status   Specimen Description   Final    BLOOD RIGHT FOREARM Performed at [facility], [place] [address] [place], [place] [address]    Special Requests   Final    BOTTLES DRAWN AEROBIC AND ANAEROBIC Blood Culture adequate volume Performed at [facility], [address] [place], [place] [address]    Culture  Setup Time   Final    GRAM NEGATIVE RODS IN BOTH AEROBIC AND ANAEROBIC BOTTLES CRITICAL RESULT CALLED TO, READ BACK BY AND VERIFIED WITH: SCOTT HALL 04/24/20 AT 0618 HS Performed at [facility], [address] [place], [place] [address]    Culture KLEBSIELLA PNEUMONIAE (A)  Final   Report Status 04/26/2020 FINAL  Final   Organism ID, Bacteria KLEBSIELLA PNEUMONIAE  Final      Susceptibility   Klebsiella pneumoniae - MIC*    AMPICILLIN >=32 RESISTANT Resistant     CEFAZOLIN <=4 SENSITIVE Sensitive     CEFEPIME <=0.12 SENSITIVE Sensitive     CEFTAZIDIME <=1 SENSITIVE Sensitive     CEFTRIAXONE <=0.25 SENSITIVE Sensitive     CIPROFLOXACIN <=0.25 SENSITIVE Sensitive     GENTAMICIN <=1 SENSITIVE Sensitive     IMIPENEM <=0.25 SENSITIVE Sensitive     TRIMETH/SULFA <=20 SENSITIVE Sensitive     AMPICILLIN/SULBACTAM >=32 RESISTANT Resistant     PIP/TAZO <=4 SENSITIVE Sensitive     * KLEBSIELLA PNEUMONIAE  Blood Culture ID Panel (Reflexed)     Status: Abnormal   Collection Time: 04/23/20  3:06 PM  Result Value Ref Range Status   Enterococcus  species NOT DETECTED NOT DETECTED Final   Listeria monocytogenes NOT DETECTED NOT DETECTED Final   Staphylococcus species NOT DETECTED NOT DETECTED Final   Staphylococcus aureus (BCID) NOT DETECTED NOT DETECTED Final   Streptococcus species NOT DETECTED NOT DETECTED Final   Streptococcus agalactiae NOT DETECTED NOT DETECTED Final   Streptococcus pneumoniae NOT DETECTED NOT DETECTED Final   Streptococcus pyogenes NOT DETECTED NOT DETECTED Final   Acinetobacter baumannii NOT DETECTED NOT DETECTED Final   Enterobacteriaceae species DETECTED (A) NOT DETECTED Final    Comment: Enterobacteriaceae represent a large family of gram-negative bacteria, not a single organism. CRITICAL RESULT CALLED TO, READ BACK BY AND VERIFIED WITH: SCOTT HALL 04/24/20 AT 0618 HS    Enterobacter cloacae complex NOT DETECTED NOT DETECTED Final   Escherichia coli NOT DETECTED NOT DETECTED Final   Klebsiella oxytoca NOT DETECTED NOT DETECTED Final   Klebsiella pneumoniae DETECTED (A) NOT DETECTED Final    Comment: CRITICAL RESULT CALLED TO, READ BACK BY AND VERIFIED WITH: SCOTT HALL 04/24/20 AT 0618 HS    Proteus species NOT DETECTED NOT DETECTED Final   Serratia marcescens NOT DETECTED NOT DETECTED Final   Carbapenem resistance NOT DETECTED NOT DETECTED Final   Haemophilus influenzae NOT DETECTED NOT DETECTED Final   Neisseria meningitidis NOT DETECTED NOT DETECTED Final   Pseudomonas aeruginosa NOT DETECTED NOT DETECTED Final   Candida albicans NOT DETECTED NOT DETECTED Final   Candida glabrata NOT  DETECTED NOT DETECTED Final   Candida krusei NOT DETECTED NOT DETECTED Final   Candida parapsilosis NOT DETECTED NOT DETECTED Final   Candida tropicalis NOT DETECTED NOT DETECTED Final    Comment: Performed at [facility], [address] [place], [place] [address]  SARS Coronavirus 2 by RT PCR (hospital order, performed in [place] hospital lab)     Status: None   Collection Time: 04/23/20  3:46 PM    Result Value Ref Range Status   SARS Coronavirus 2 NEGATIVE NEGATIVE Final    Comment: (NOTE) SARS-CoV-2 target nucleic acids are NOT DETECTED.  The SARS-CoV-2 RNA is generally detectable in upper and lower respiratory specimens during the acute phase of infection. The lowest concentration of SARS-CoV-2 viral copies this assay can detect is 250 copies / mL. A negative result does not preclude SARS-CoV-2 infection and should not be used as the sole basis for treatment or other patient management decisions.  A negative result may occur with improper specimen collection / handling, submission of specimen other than nasopharyngeal swab, presence of viral mutation(s) within the areas targeted by this assay, and inadequate number of viral copies (<250 copies / mL). A negative result must be combined with clinical observations, patient history, and epidemiological information.  Fact Sheet for Patients:   StrictlyIdeas.no  Fact Sheet for Healthcare Providers: BankingDealers.co.za  This test is not yet approved or  cleared by the [place] FDA and has been authorized for detection and/or diagnosis of SARS-CoV-2 by FDA under an Emergency Use Authorization (EUA).  This EUA will remain in effect (meaning this test can be used) for the duration of the COVID-19 declaration under Section 564(b)(1) of the Act, 21 U.S.C. section 360bbb-3(b)(1), unless the authorization is terminated or revoked sooner.  Performed at [facility], [address] [place], [place] [address]       Imaging Studies   DG Chest [place] 1 View  Result Date: 04/30/2020 CLINICAL DATA:  Shortness of breath. EXAM: PORTABLE CHEST 1 VIEW COMPARISON:  Radiograph 04/23/2020 FINDINGS: Stable cardiomegaly. Unchanged mediastinal contours. Vascular congestion which appears similar to prior exam. No acute or focal airspace disease. No pleural fluid or pneumothorax.  IMPRESSION: Stable cardiomegaly and vascular congestion. No new abnormality. Electronically Signed   By: Keith Rake M.D.   On: 04/30/2020 16:46     Medications   Scheduled Meds: . sodium chloride   Intravenous Once  . atorvastatin  20 mg Oral Daily  . [START ON 05/03/2020] darbepoetin (ARANESP) injection - NON-DIALYSIS  100 mcg Subcutaneous Q Tue-1800  . ferrous sulfate  325 mg Oral Daily  . hydrALAZINE  50 mg Oral BID  . insulin aspart  0-20 Units Subcutaneous TID WC  . insulin aspart  0-5 Units Subcutaneous QHS  . lactulose  60 g Oral Daily  . losartan  25 mg Oral Daily  . pantoprazole  40 mg Oral BID  . potassium chloride  40 mEq Oral Daily  . rifaximin  550 mg Oral BID  . sodium chloride flush  3 mL Intravenous Q12H  . spironolactone  25 mg Oral Daily   Continuous Infusions: . sodium chloride    . albumin human 12.5 g (05/01/20 0850)  .  ceFAZolin (ANCEF) IV         LOS: 8 days    Time spent: 28 minutes with greater than 50% spent in coordination of care and direct patient contact    Ezekiel Slocumb, DO [facility]  05/01/2020, 9:24  AM    If 7PM-7AM, please contact night-coverage. How to contact the [facility] Attending or Consulting provider [place] or covering provider during after hours [place], for this patient?    1. Check the care team in [facility] and look for a) attending/consulting TR[facility] provider listed and b) the [facility] team listed 2. Log into [email or web address] and use [place]'s universal password to access. If you do not have the password, please contact the hospital operator. 3. Locate the [place] [facility] provider you are looking for under [facility] and page to a number that you can be directly reached. 4. If you still have difficulty reaching the provider, please page the [facility] (Director on Call) for the Hospitalists listed on amion for assistance.

## 2020-05-01 NOTE — Progress Notes (Signed)
[place]  [phone] [phone]  ID: Corey West OB: 1951/01/10  MR#: [id number]  UXN#:[id number]  Patient Care Team: Leonel Ramsay, MD as PCP - General (Infectious Diseases) End, Harrell Gave, MD as PCP - Cardiology (Cardiology) Baxter Hire, MD (Internal Medicine) Lloyd Huger, MD as Consulting Physician (Oncology)  CHIEF COMPLAINT: Anemia, thrombocytopenia  INTERVAL HISTORY: Patient presents to clinic today for routine follow-up.  He was recently hospitalized from 04/23/2020-05/03/2020 for chronic diastolic heart failure with fluid overload, pneumonia and anemia.  He was diuresed, given antibiotics and a unit of blood IV iron.  Since discharge she has been doing well.  He is tired but otherwise denies any new complaints.  REVIEW OF SYSTEMS:   Review of Systems  Constitutional: Positive for malaise/fatigue. Negative for fever and weight loss.  Respiratory: Negative.  Negative for cough, hemoptysis and shortness of breath.   Cardiovascular: Negative.  Negative for chest pain and leg swelling.  Gastrointestinal: Negative.  Negative for abdominal pain, blood in stool and melena.  Genitourinary: Negative.  Negative for hematuria.  Musculoskeletal: Negative.  Negative for back pain.  Skin: Negative.  Negative for rash.  Neurological: Positive for weakness. Negative for sensory change, focal weakness and headaches.  Psychiatric/Behavioral: Negative.  The patient is not nervous/anxious.     As per HPI. Otherwise, a complete review of systems is negative.  PAST MEDICAL HISTORY: Past Medical History:  Diagnosis Date  . Anemia   . Anxiety    controlled;   . Arthritis   . AVM (arteriovenous malformation) of stomach, acquired with hemorrhage   . Barrett's esophagus   . CHF (congestive heart failure) ([place])   . Chronic kidney disease    renal infufficiency  . Cirrhosis ([place])   . Depression    controlled;   Marland Kitchen Diabetes mellitus  without complication ([place])    not controlled, taking insulin but sugar continues to run high;   . Edema   . Esophageal varices ([place])   . GAVE (gastric antral vascular ectasia)   . GERD (gastroesophageal reflux disease)   . History of hiatal hernia   . Hyperlipidemia   . Hypertension    controlled well;   . Liver cirrhosis ([place])   . Nephropathy, diabetic ([place])   . Obesity   . Pancytopenia ([place])   . Polyp, stomach    with chronic blood loss  . Sleep apnea    does not wear a cpap, Medicare would not pay for it   . Venous stasis dermatitis of both lower extremities   . Venous stasis of both lower extremities    with cellulitis    PAST SURGICAL HISTORY: Past Surgical History:  Procedure Laterality Date  . ESOPHAGOGASTRODUODENOSCOPY N/A 02/09/2015   Procedure: ESOPHAGOGASTRODUODENOSCOPY (EGD);  Surgeon: Manya Silvas, MD;  Location[facility] ENDOSCOPY;  Service: Endoscopy;  Laterality: N/A;  . ESOPHAGOGASTRODUODENOSCOPY N/A 07/22/2015   Procedure: ESOPHAGOGASTRODUODENOSCOPY (EGD);  Surgeon: Manya Silvas, MD;  Location: [facility] ENDOSCOPY;  Service: Endoscopy;  Laterality: N/A;  . ESOPHAGOGASTRODUODENOSCOPY N/A 06/26/2017   Procedure: ESOPHAGOGASTRODUODENOSCOPY (EGD);  Surgeon: Manya Silvas, MD;  Location: [facility] ENDOSCOPY;  Service: Endoscopy;  Laterality: N/A;  . ESOPHAGOGASTRODUODENOSCOPY N/A 06/27/2017   Procedure: ESOPHAGOGASTRODUODENOSCOPY (EGD);  Surgeon: Manya Silvas, MD;  Location: [facility] ENDOSCOPY;  Service: Endoscopy;  Laterality: N/A;  . ESOPHAGOGASTRODUODENOSCOPY N/A 06/28/2017   Procedure: ESOPHAGOGASTRODUODENOSCOPY (EGD);  Surgeon: Manya Silvas, MD;  Location: [facility] ENDOSCOPY;  Service: Endoscopy;  Laterality: N/A;  . ESOPHAGOGASTRODUODENOSCOPY Bilateral 10/11/2017  Procedure: ESOPHAGOGASTRODUODENOSCOPY (EGD);  Surgeon: Manya Silvas, MD;  Location: [facility] ENDOSCOPY;  Service: Endoscopy;  Laterality: Bilateral;  . ESOPHAGOGASTRODUODENOSCOPY N/A 12/04/2017    Procedure: ESOPHAGOGASTRODUODENOSCOPY (EGD);  Surgeon: Manya Silvas, MD;  Location: [facility] ENDOSCOPY;  Service: Endoscopy;  Laterality: N/A;  . ESOPHAGOGASTRODUODENOSCOPY N/A 03/06/2019   Procedure: ESOPHAGOGASTRODUODENOSCOPY (EGD);  Surgeon: Tol[place], Benay Pike, MD;  Location: [facility] ENDOSCOPY;  Service: Gastroenterology;  Laterality: N/A;  . ESOPHAGOGASTRODUODENOSCOPY (EGD) WITH PROPOFOL N/A 07/20/2015   Procedure: ESOPHAGOGASTRODUODENOSCOPY (EGD) WITH PROPOFOL;  Surgeon: Manya Silvas, MD;  Location: [facility] ENDOSCOPY;  Service: Endoscopy;  Laterality: N/A;  . ESOPHAGOGASTRODUODENOSCOPY (EGD) WITH PROPOFOL N/A 09/16/2015   Procedure: ESOPHAGOGASTRODUODENOSCOPY (EGD) WITH PROPOFOL;  Surgeon: Manya Silvas, MD;  Location: [facility] ENDOSCOPY;  Service: Endoscopy;  Laterality: N/A;  . ESOPHAGOGASTRODUODENOSCOPY (EGD) WITH PROPOFOL N/A 03/16/2016   Procedure: ESOPHAGOGASTRODUODENOSCOPY (EGD) WITH PROPOFOL;  Surgeon: Manya Silvas, MD;  Location: [facility] ENDOSCOPY;  Service: Endoscopy;  Laterality: N/A;  . ESOPHAGOGASTRODUODENOSCOPY (EGD) WITH PROPOFOL N/A 09/14/2016   Procedure: ESOPHAGOGASTRODUODENOSCOPY (EGD) WITH PROPOFOL;  Surgeon: Manya Silvas, MD;  Location: [facility] ENDOSCOPY;  Service: Endoscopy;  Laterality: N/A;  . ESOPHAGOGASTRODUODENOSCOPY (EGD) WITH PROPOFOL N/A 11/05/2016   Procedure: ESOPHAGOGASTRODUODENOSCOPY (EGD) WITH PROPOFOL;  Surgeon: Manya Silvas, MD;  Location: [facility] ENDOSCOPY;  Service: Endoscopy;  Laterality: N/A;  . ESOPHAGOGASTRODUODENOSCOPY (EGD) WITH PROPOFOL N/A 02/06/2017   Procedure: ESOPHAGOGASTRODUODENOSCOPY (EGD) WITH PROPOFOL;  Surgeon: Manya Silvas, MD;  Location: [facility] ENDOSCOPY;  Service: Endoscopy;  Laterality: N/A;  . ESOPHAGOGASTRODUODENOSCOPY (EGD) WITH PROPOFOL N/A 04/24/2017   Procedure: ESOPHAGOGASTRODUODENOSCOPY (EGD) WITH PROPOFOL;  Surgeon: Manya Silvas, MD;  Location: [facility] ENDOSCOPY;  Service: Endoscopy;  Laterality: N/A;  .  ESOPHAGOGASTRODUODENOSCOPY (EGD) WITH PROPOFOL N/A 07/31/2017   Procedure: ESOPHAGOGASTRODUODENOSCOPY (EGD) WITH PROPOFOL;  Surgeon: Manya Silvas, MD;  Location: [facility] ENDOSCOPY;  Service: Endoscopy;  Laterality: N/A;  . ESOPHAGOGASTRODUODENOSCOPY (EGD) WITH PROPOFOL N/A 08/08/2018   Procedure: ESOPHAGOGASTRODUODENOSCOPY (EGD) WITH PROPOFOL;  Surgeon: Manya Silvas, MD;  Location: [facility] ENDOSCOPY;  Service: Endoscopy;  Laterality: N/A;  . GIVENS CAPSULE STUDY N/A 12/05/2017   Procedure: GIVENS CAPSULE STUDY;  Surgeon: Manya Silvas, MD;  Location: [facility] ENDOSCOPY;  Service: Endoscopy;  Laterality: N/A;  . TONSILLECTOMY    . TONSILLECTOMY AND ADENOIDECTOMY    . ULNAR NERVE TRANSPOSITION    . UVULOPALATOPHARYNGOPLASTY      FAMILY HISTORY: Family History  Problem Relation Age of Onset  . Diabetes Other   . Transient ischemic attack Father   . CAD Father     ADVANCED DIRECTIVES (Y/N):  N  HEALTH MAINTENANCE: Social History   Tobacco Use  . Smoking status: Former Smoker    Years: 20.00    Types: Cigars, Cigarettes  . Smokeless tobacco: Never Used  Vaping Use  . Vaping Use: Never used  Substance Use Topics  . Alcohol use: No    Comment: stopped 15 years ago  . Drug use: No     Colonoscopy:  PAP:  Bone density:  Lipid panel:  No Known Allergies  No current facility-administered medications for this visit.   No current outpatient medications on file.   Facility-Administered Medications Ordered in Other Visits  Medication Dose Route Frequency Provider Last Rate Last Admin  . 0.9 %  sodium chloride infusion (Manually program via Guardrails IV Fluids)   Intravenous Once Nicole Kindred A, DO      . 0.9 %  sodium chloride infusion  250 mL Intravenous PRN Athena Masse, MD      .  acetaminophen (TYLENOL) tablet 650 mg  650 mg Oral Q4H PRN Athena Masse, MD   650 mg at 04/24/20 0256  . albumin human 25 % solution 12.5 g  12.5 g Intravenous TID Murlean Iba, MD 60  mL/hr at 05/01/20 0850 12.5 g at 05/01/20 0850  . atorvastatin (LIPITOR) tablet 20 mg  20 mg Oral Daily Jennye Boroughs, MD   20 mg at 05/01/20 0852  . benzonatate (TESSALON) capsule 100 mg  100 mg Oral TID PRN Lang Snow, NP      . ceFAZolin (ANCEF) IVPB 2g/100 mL premix  2 g Intravenous Q12H Nicole Kindred A, DO      . [START ON 05/03/2020] Darbepoetin Alfa (ARANESP) injection 100 mcg  100 mcg Subcutaneous Q Tue-1800 Singh, Harmeet, MD      . ferrous sulfate tablet 325 mg  325 mg Oral Daily Jennye Boroughs, MD   325 mg at 05/01/20 6629  . hydrALAZINE (APRESOLINE) tablet 50 mg  50 mg Oral BID Minna Merritts, MD   50 mg at 05/01/20 0852  . insulin aspart (novoLOG) injection 0-20 Units  0-20 Units Subcutaneous TID WC Athena Masse, MD   4 Units at 05/01/20 [phone]  . insulin aspart (novoLOG) injection 0-5 Units  0-5 Units Subcutaneous QHS Athena Masse, MD   2 Units at 04/29/20 2205  . ipratropium-albuterol (DUONEB) 0.5-2.5 (3) MG/3ML nebulizer solution 3 mL  3 mL Nebulization Q6H PRN Jennye Boroughs, MD   3 mL at 04/30/20 1316  . lactulose (CHRONULAC) 10 GM/15ML solution 60 g  60 g Oral Daily Jennye Boroughs, MD   60 g at 04/30/20 1525  . LORazepam (ATIVAN) tablet 0.5 mg  0.5 mg Oral TID PRN Jennye Boroughs, MD   0.5 mg at 05/01/20 0902  . losartan (COZAAR) tablet 25 mg  25 mg Oral Daily Jennye Boroughs, MD   25 mg at 05/01/20 0852  . ondansetron (ZOFRAN) injection 4 mg  4 mg Intravenous Q6H PRN Athena Masse, MD   4 mg at 04/24/20 1052  . oxyCODONE (Oxy IR/ROXICODONE) immediate release tablet 5 mg  5 mg Oral Q6H PRN Nicole Kindred A, DO   5 mg at 05/01/20 0302  . pantoprazole (PROTONIX) EC tablet 40 mg  40 mg Oral BID Jennye Boroughs, MD   40 mg at 05/01/20 0852  . phenol (CHLORASEPTIC) mouth spray 1 spray  1 spray Mouth/Throat PRN Jennye Boroughs, MD   1 spray at 04/26/20 2256  . potassium chloride [place] (KLOR-CON) CR tablet 40 mEq  40 mEq Oral Daily Jennye Boroughs, MD   40 mEq at 05/01/20  0852  . rifaximin (XIFAXAN) tablet 550 mg  550 mg Oral BID Jennye Boroughs, MD   550 mg at 05/01/20 0851  . sodium chloride (OCEAN) 0.65 % nasal spray 1 spray  1 spray Each Nare PRN Jennye Boroughs, MD      . sodium chloride flush (NS) 0.9 % injection 3 mL  3 mL Intravenous Q12H Athena Masse, MD   3 mL at 04/30/20 2312  . sodium chloride flush (NS) 0.9 % injection 3 mL  3 mL Intravenous PRN Athena Masse, MD      . spironolactone (ALDACTONE) tablet 25 mg  25 mg Oral Daily Jennye Boroughs, MD   25 mg at 05/01/20 0853  . traMADol (ULTRAM) tablet 50 mg  50 mg Oral Q6H PRN Nicole Kindred A, DO   50 mg at 04/29/20 2204    OBJECTIVE: There were  no vitals filed for this visit.   There is no height or weight on file to calculate BMI.    ECOG FS:0 - Asymptomatic  Physical Exam Constitutional:      Appearance: Normal appearance.  HENT:     Head: Normocephalic and atraumatic.  Eyes:     Pupils: Pupils are equal, round, and reactive to light.  Cardiovascular:     Rate and Rhythm: Normal rate and regular rhythm.     Heart sounds: Normal heart sounds. No murmur heard.   Pulmonary:     Effort: Pulmonary effort is normal.     Breath sounds: Normal breath sounds. No wheezing.  Abdominal:     General: Bowel sounds are normal. There is no distension.     Palpations: Abdomen is soft.     Tenderness: There is no abdominal tenderness.  Musculoskeletal:        General: Normal range of motion.     Cervical back: Normal range of motion.  Skin:    General: Skin is warm and dry.     Findings: No rash.  Neurological:     Mental Status: He is alert and oriented to person, place, and time.  Psychiatric:        Judgment: Judgment normal.     LAB RESULTS:  Lab Results  Component Value Date   NA 134 (L) 05/01/2020   K 4.1 05/01/2020   CL 96 (L) 05/01/2020   CO2 26 05/01/2020   GLUCOSE 154 (H) 05/01/2020   BUN 70 (H) 05/01/2020   CREATININE 3.43 (H) 05/01/2020   CALCIUM 8.6 (L) 05/01/2020    PROT 7.7 04/23/2020   ALBUMIN 3.4 (L) 04/23/2020   AST 32 04/23/2020   ALT 19 04/23/2020   ALKPHOS 91 04/23/2020   BILITOT 1.0 04/23/2020   GFRNONAA 17 (L) 05/01/2020   GFRAA 20 (L) 05/01/2020    Lab Results  Component Value Date   WBC 4.6 04/30/2020   NEUTROABS 8.8 (H) 04/23/2020   HGB 7.5 (L) 04/30/2020   HCT 23.2 (L) 04/30/2020   MCV 93.8 04/30/2020   PLT 111 (L) 04/30/2020   Lab Results  Component Value Date   IRON 34 (L) 04/28/2020   TIBC 286 04/28/2020   IRONPCTSAT 12 (L) 04/28/2020   Lab Results  Component Value Date   FERRITIN 35 04/28/2020     STUDIES: CT ABDOMEN PELVIS WO CONTRAST  Result Date: 04/23/2020 CLINICAL DATA:  69 year old male with history of abdominal pain and fever. EXAM: CT ABDOMEN AND PELVIS WITHOUT CONTRAST TECHNIQUE: Multidetector CT imaging of the abdomen and pelvis was performed following the standard protocol without IV contrast. COMPARISON:  No priors. FINDINGS: Lower chest: Trace left pleural effusion lying dependently. Diffuse low attenuation throughout the intravascular compartment, suggestive of anemia. Atherosclerotic calcifications in the left anterior descending, left circumflex and right coronary arteries. Calcifications of the aortic valve. Hepatobiliary: Liver has a shrunken appearance and nodular contour, indicative of underlying cirrhosis. Calcified granulomas are noted in the liver. No discrete cystic or solid hepatic lesions are other identified on today's noncontrast CT examination. Calcified gallstones are noted lying dependently in the gallbladder measuring up to 9 mm. Gallbladder is not distended. Pancreas: No definite pancreatic mass or peripancreatic fluid collections or inflammatory changes are noted on today's noncontrast CT examination. Spleen: Spleen is mildly enlarged measuring 16.7 x 7.1 x 16.9 cm (estimated splenic volume of 1,002 mL) . Adrenals/Urinary Tract: There are no abnormal calcifications within the collecting system  of either kidney, along  the course of either ureter, or within the lumen of the urinary bladder. No hydroureteronephrosis or perinephric stranding to suggest urinary tract obstruction at this time. The unenhanced appearance of the kidneys is unremarkable bilaterally. Unenhanced appearance of the urinary bladder is normal. Bilateral adrenal glands are normal in appearance. Stomach/Bowel: Unenhanced appearance of the stomach is normal. No pathologic dilatation of small bowel or colon. Appendix is not confidently identified may be surgically absent. Vascular/Lymphatic: Aortic atherosclerosis. Numerous portosystemic collateral pathways, most notable for large splenorenal collateral vessels. No lymphadenopathy noted in the abdomen or pelvis. Reproductive: Prostate gland and seminal vesicles are unremarkable in appearance. Large right-sided hydrocele incompletely imaged. Other: Small umbilical hernia containing only omental fat. Small volume of ascites. No pneumoperitoneum. Musculoskeletal: There are no aggressive appearing lytic or blastic lesions noted in the visualized portions of the skeleton. IMPRESSION: 1. Morphologic changes in the liver compatible with cirrhosis. This is associated with evidence of portal venous hypertension, including splenomegaly and numerous portosystemic collateral vessels. 2. Cholelithiasis without evidence of acute cholecystitis at this time. Small umbilical hernia containing only omental fat large right-sided hydrocele incompletely imaged. 3. Trace left pleural effusion lying dependent 4. Diffuse low attenuation throughout the intravascular compartment, suggesting anemia. 5. Aortic atherosclerosis, in addition to least 3 vessel coronary artery disease. Please note that although the presence of coronary artery calcium documents the presence of coronary artery disease, the severity of this disease and any potential stenosis cannot be assessed on this non-gated CT examination. Assessment for  potential risk factor modification, dietary therapy or pharmacologic therapy may be warranted, if clinically indicated. 6. There are calcifications of the aortic valve. Echocardiographic correlation for evaluation of potential valvular dysfunction may be warranted if clinically indicated. 7. Additional incidental findings, as above. Electronically Signed   By: Vinnie Langton M.D.   On: 04/23/2020 18:44   DG Chest Port 1 View  Result Date: 04/30/2020 CLINICAL DATA:  Shortness of breath. EXAM: PORTABLE CHEST 1 VIEW COMPARISON:  Radiograph 04/23/2020 FINDINGS: Stable cardiomegaly. Unchanged mediastinal contours. Vascular congestion which appears similar to prior exam. No acute or focal airspace disease. No pleural fluid or pneumothorax. IMPRESSION: Stable cardiomegaly and vascular congestion. No new abnormality. Electronically Signed   By: Keith Rake M.D.   On: 04/30/2020 16:46   DG Chest Port 1 View  Result Date: 04/23/2020 CLINICAL DATA:  Dyspnea, worsening shortness of breath and productive cough EXAM: PORTABLE CHEST 1 VIEW COMPARISON:  Radiograph 03/10/2020, CT 12/14/2019 FINDINGS: Chronically low lung volumes with some central vascular congestion similar to the comparison exam. No convincing features of frank edema at this time. No new focal areas of consolidation. No effusion or pneumothorax is visible. Mild cardiomegaly is similar to prior accounting for differences in technique. Telemetry leads overlie the chest. No acute osseous or soft tissue abnormality. Degenerative changes are present in the imaged spine and shoulders. Dextrocurvature of the thoracic spine similar to prior. IMPRESSION: Chronically low lung volumes with some central vascular congestion. No convincing features of frank edema or other acute cardiopulmonary abnormality. Stable cardiomegaly. Electronically Signed   By: Lovena Le M.D.   On: 04/23/2020 15:18   ECHOCARDIOGRAM COMPLETE  Result Date: 04/25/2020     ECHOCARDIOGRAM REPORT   Patient Name:   Corey West Date of Exam: 04/24/2020 Medical Rec #:  [id number]    Height:       70.0 in Accession #:    [id number]   Weight:       304.0 lb Date of Birth:  07-05-1951     BSA:          2.494 m Patient Age:    26 years     BP:           136/65 mmHg Patient Gender: M            HR:           63 bpm. Exam Location:  [facility] Procedure: 2D Echo, Cardiac Doppler and Color Doppler Indications:     CHF-Acute Diastolic 601.09 / N23.55  History:         Patient has prior history of Echocardiogram examinations. CHF;                  Risk Factors:Hypertension.  Sonographer:     Alyse Low Roar Referring Phys:  [id number] Athena Masse Diagnosing Phys: Kathlyn Sacramento MD IMPRESSIONS  1. Left ventricular ejection fraction, by estimation, is 50 to 55%. The left ventricle has low normal function. The left ventricle has no regional wall motion abnormalities. The left ventricular internal cavity size was mildly dilated. There is mild left ventricular hypertrophy. Left ventricular diastolic parameters are consistent with Grade II diastolic dysfunction (pseudonormalization).  2. Right ventricular systolic function is normal. The right ventricular size is normal. There is mildly elevated pulmonary artery systolic pressure.  3. Left atrial size was moderately dilated.  4. Right atrial size was mildly dilated.  5. The mitral valve is normal in structure. Trivial mitral valve regurgitation. No evidence of mitral stenosis.  6. The aortic valve is abnormal. Aortic valve regurgitation is not visualized. Mild to moderate aortic valve stenosis. Aortic valve area, by VTI measures 1.36 cm. Aortic valve mean gradient measures 14.3 mmHg.  7. The inferior vena cava is dilated in size with <50% respiratory variability, suggesting right atrial pressure of 15 mmHg. FINDINGS  Left Ventricle: Left ventricular ejection fraction, by estimation, is 50 to 55%. The left ventricle has low normal function. The left ventricle has  no regional wall motion abnormalities. The left ventricular internal cavity size was mildly dilated. There is mild left ventricular hypertrophy. Left ventricular diastolic parameters are consistent with Grade II diastolic dysfunction (pseudonormalization). Right Ventricle: The right ventricular size is normal. No increase in right ventricular wall thickness. Right ventricular systolic function is normal. There is mildly elevated pulmonary artery systolic pressure. The tricuspid regurgitant velocity is 2.59  m/s, and with an assumed right atrial pressure of 15 mmHg, the estimated right ventricular systolic pressure is 42.7 mmHg. Left Atrium: Left atrial size was moderately dilated. Right Atrium: Right atrial size was mildly dilated. Pericardium: There is no evidence of pericardial effusion. Mitral Valve: The mitral valve is normal in structure. Normal mobility of the mitral valve leaflets. Moderate mitral annular calcification. Trivial mitral valve regurgitation. No evidence of mitral valve stenosis. Tricuspid Valve: The tricuspid valve is normal in structure. Tricuspid valve regurgitation is trivial. No evidence of tricuspid stenosis. Aortic Valve: The aortic valve is abnormal. . There is mild thickening and moderate calcification of the aortic valve. Aortic valve regurgitation is not visualized. Mild to moderate aortic stenosis is present. There is mild thickening of the aortic valve. There is moderate calcification of the aortic valve. Aortic valve mean gradient measures 14.3 mmHg. Aortic valve peak gradient measures 24.1 mmHg. Aortic valve area, by VTI measures 1.36 cm. Pulmonic Valve: The pulmonic valve was normal in structure. Pulmonic valve regurgitation is not visualized. No evidence of pulmonic stenosis. Aorta: The aortic root is normal in size and  structure. Venous: The inferior vena cava is dilated in size with less than 50% respiratory variability, suggesting right atrial pressure of 15 mmHg. IAS/Shunts:  No atrial level shunt detected by color flow Doppler.  LEFT VENTRICLE PLAX 2D LVIDd:         5.46 cm  Diastology LVIDs:         4.09 cm  LV e' lateral:   8.27 cm/s LV PW:         1.22 cm  LV E/e' lateral: 17.9 LV IVS:        1.34 cm  LV e' medial:    8.05 cm/s LVOT diam:     1.70 cm  LV E/e' medial:  18.4 LV SV:         76 LV SV Index:   30 LVOT Area:     2.27 cm  RIGHT VENTRICLE RV Mid diam:    3.78 cm RV S prime:     13.70 cm/s TAPSE (M-mode): 2.2 cm LEFT ATRIUM              Index       RIGHT ATRIUM           Index LA diam:        4.60 cm  1.84 cm/m  RA Area:     22.10 cm LA Vol (A2C):   117.0 ml 46.92 ml/m RA Volume:   71.80 ml  28.79 ml/m LA Vol (A4C):   101.0 ml 40.50 ml/m LA Biplane Vol: 112.0 ml 44.91 ml/m  AORTIC VALVE                    PULMONIC VALVE AV Area (Vmax):    1.35 cm     PV Vmax:        1.45 m/s AV Area (Vmean):   1.39 cm     PV Peak grad:   8.4 mmHg AV Area (VTI):     1.36 cm     RVOT Peak grad: 5 mmHg AV Vmax:           245.50 cm/s AV Vmean:          178.333 cm/s AV VTI:            0.555 m AV Peak Grad:      24.1 mmHg AV Mean Grad:      14.3 mmHg LVOT Vmax:         146.00 cm/s LVOT Vmean:        109.000 cm/s LVOT VTI:          0.333 m LVOT/AV VTI ratio: 0.60  AORTA Ao Root diam: 3.00 cm MITRAL VALVE                TRICUSPID VALVE MV Area (PHT): 3.27 cm     TR Peak grad:   26.8 mmHg MV Decel Time: 232 msec     TR Vmax:        259.00 cm/s MV E velocity: 148.00 cm/s MV A velocity: 107.00 cm/s  SHUNTS MV E/A ratio:  1.38         Systemic VTI:  0.33 m MV A Prime:    11.4 cm/s    Systemic Diam: 1.70 cm Kathlyn Sacramento MD Electronically signed by Kathlyn Sacramento MD Signature Date/Time: 04/25/2020/8:22:25 AM    Final     ASSESSMENT: Anemia, thrombocytopenia  PLAN:    1.  Anemia: Multifactorial, possibly secondary to chronic renal insufficiency as well as GI blood loss  from his known AV malformations.  His most recent EGD was on August 08, 2018.  While hospitalized, he received IV  Venofer (04/29/2020) and 1 unit packed red blood cells (04/30/2020) for a hemoglobin of 6.7.  Hemoglobin at discharge was 7.4.  Today his hemoglobin has trended back down to 6.5.  He will receive 1 dose of IV Venofer today while in clinic and will return on Monday or Tuesday of next week for 2 units of packed red blood cells.  2.  Thrombocytopenia: Chronic and unchanged. 3.  Chronic renal insufficiency: Chronic and unchanged.  Continue monitoring and treatment per nephrology.  I spent a total of 30 minutes reviewing chart data, face-to-face evaluation with the patient, counseling and coordination of care as detailed above.   Patient expressed understanding and was in agreement with this plan. He also understands that He can call clinic at any time with any questions, concerns, or complaints.    Lloyd Huger, MD   05/01/2020 10:10 AM

## 2020-05-01 NOTE — Progress Notes (Addendum)
ISRRAEL FLUCKIGER  MRN: [id number]  DOB/AGE: January 02, 1951 69 y.o.  Primary Care Physician:Fitzgerald, Cheral Marker, MD  Admit date: 04/23/2020  Chief Complaint:  Chief Complaint  Patient presents with  . Shortness of Breath    S-Pt presented on  04/23/2020 with  Chief Complaint  Patient presents with  . Shortness of Breath  . Patient says " I am not urinating much, I do not think I am emptying my bladder"   Medications  . sodium chloride   Intravenous Once  . atorvastatin  20 mg Oral Daily  . [START ON 05/03/2020] darbepoetin (ARANESP) injection - NON-DIALYSIS  100 mcg Subcutaneous Q Tue-1800  . ferrous sulfate  325 mg Oral Daily  . hydrALAZINE  50 mg Oral BID  . insulin aspart  0-20 Units Subcutaneous TID WC  . insulin aspart  0-5 Units Subcutaneous QHS  . lactulose  60 g Oral Daily  . pantoprazole  40 mg Oral BID  . potassium chloride  40 mEq Oral Daily  . rifaximin  550 mg Oral BID  . sodium chloride flush  3 mL Intravenous Q12H  . spironolactone  25 mg Oral Daily         DGU:[id number] from the symptoms mentioned above,there are no other symptoms referable to all systems reviewed.  Physical Exam: Vital signs in last 24 hours: Temp:  [97.7 F (36.5 C)-98.5 F (36.9 C)] 98 F (36.7 C) (08/01 0749) Pulse Rate:  [69-87] 72 (08/01 0749) Resp:  [17-20] 17 (08/01 0749) BP: (120-135)/(43-62) 135/48 (08/01 0749) SpO2:  [99 %-100 %] 100 % (08/01 0749) Weight change:  Last BM Date: 04/30/20  Intake/Output from previous day: 07/31 0701 - 08/01 0700 In: 1000 [I.V.:150; Blood:700; IV Piggyback:150] Out: 1000 [Urine:1000] No intake/output data recorded.   Physical Exam: General- pt is awake,alert, oriented to time place and person Resp- No acute REsp distress, Rhonchi+ CVS- S1S2 regular in rate and rhythm GIT- BS+, soft, NT, ND EXT- B/l bandages on lower ext, NO cyanosis   Lab Results: CBC Recent Labs    04/29/20 0439 04/29/20 1205 04/30/20 0443 04/30/20 2105  WBC 4.7   --  4.6  --   HGB 7.0*   < > 6.7* 7.5*  HCT 21.0*   < > 21.2* 23.2*  PLT 112*  --  111*  --    < > = values in this interval not displayed.    BMET Recent Labs    04/30/20 0443 05/01/20 0557  NA 131* 134*  K 3.6 4.1  CL 93* 96*  CO2 27 26  GLUCOSE 170* 154*  BUN 67* 70*  CREATININE 2.97* 3.43*  CALCIUM 8.8* 8.6*    Creatinine trend 2021 2.8--3.0==>3.4 During March/May admission 2.4--3.3 Earlier 2.4--2.7 baseline 2020 1.7--2.4 2019 1.6--2.4 2018 1.9--2.3 2017 1.9 2016 2.0--2.4 2015 1.8--2.6 2014 1.8  MICRO Recent Results (from the past 240 hour(s))  Blood Culture (routine x 2)     Status: None   Collection Time: 04/23/20  3:06 PM   Specimen: BLOOD  Result Value Ref Range Status   Specimen Description BLOOD RAC  Final   Special Requests   Final    BOTTLES DRAWN AEROBIC AND ANAEROBIC Blood Culture results may not be optimal due to an inadequate volume of blood received in culture bottles   Culture   Final    NO GROWTH 5 DAYS Performed at [facility], [address], [place], [place] [address]    Report Status 04/28/2020 FINAL  Final  Blood  Culture (routine x 2)     Status: Abnormal   Collection Time: 04/23/20  3:06 PM   Specimen: BLOOD RIGHT FOREARM  Result Value Ref Range Status   Specimen Description   Final    BLOOD RIGHT FOREARM Performed at [facility], [address] [place], [place] [address]    Special Requests   Final    BOTTLES DRAWN AEROBIC AND ANAEROBIC Blood Culture adequate volume Performed at [facility], [address] [place], [place] [address]    Culture  Setup Time   Final    GRAM NEGATIVE RODS IN BOTH AEROBIC AND ANAEROBIC BOTTLES CRITICAL RESULT CALLED TO, READ BACK BY AND VERIFIED WITH: SCOTT HALL 04/24/20 AT 0618 HS Performed at [facility], [address], [place], [place] [address]    Culture KLEBSIELLA PNEUMONIAE (A)  Final   Report Status 04/26/2020 FINAL  Final   Organism ID, Bacteria  KLEBSIELLA PNEUMONIAE  Final      Susceptibility   Klebsiella pneumoniae - MIC*    AMPICILLIN >=32 RESISTANT Resistant     CEFAZOLIN <=4 SENSITIVE Sensitive     CEFEPIME <=0.12 SENSITIVE Sensitive     CEFTAZIDIME <=1 SENSITIVE Sensitive     CEFTRIAXONE <=0.25 SENSITIVE Sensitive     CIPROFLOXACIN <=0.25 SENSITIVE Sensitive     GENTAMICIN <=1 SENSITIVE Sensitive     IMIPENEM <=0.25 SENSITIVE Sensitive     TRIMETH/SULFA <=20 SENSITIVE Sensitive     AMPICILLIN/SULBACTAM >=32 RESISTANT Resistant     PIP/TAZO <=4 SENSITIVE Sensitive     * KLEBSIELLA PNEUMONIAE  Blood Culture ID Panel (Reflexed)     Status: Abnormal   Collection Time: 04/23/20  3:06 PM  Result Value Ref Range Status   Enterococcus species NOT DETECTED NOT DETECTED Final   Listeria monocytogenes NOT DETECTED NOT DETECTED Final   Staphylococcus species NOT DETECTED NOT DETECTED Final   Staphylococcus aureus (BCID) NOT DETECTED NOT DETECTED Final   Streptococcus species NOT DETECTED NOT DETECTED Final   Streptococcus agalactiae NOT DETECTED NOT DETECTED Final   Streptococcus pneumoniae NOT DETECTED NOT DETECTED Final   Streptococcus pyogenes NOT DETECTED NOT DETECTED Final   Acinetobacter baumannii NOT DETECTED NOT DETECTED Final   Enterobacteriaceae species DETECTED (A) NOT DETECTED Final    Comment: Enterobacteriaceae represent a large family of gram-negative bacteria, not a single organism. CRITICAL RESULT CALLED TO, READ BACK BY AND VERIFIED WITH: SCOTT HALL 04/24/20 AT 0618 HS    Enterobacter cloacae complex NOT DETECTED NOT DETECTED Final   Escherichia coli NOT DETECTED NOT DETECTED Final   Klebsiella oxytoca NOT DETECTED NOT DETECTED Final   Klebsiella pneumoniae DETECTED (A) NOT DETECTED Final    Comment: CRITICAL RESULT CALLED TO, READ BACK BY AND VERIFIED WITH: SCOTT HALL 04/24/20 AT 0618 HS    Proteus species NOT DETECTED NOT DETECTED Final   Serratia marcescens NOT DETECTED NOT DETECTED Final   Carbapenem  resistance NOT DETECTED NOT DETECTED Final   Haemophilus influenzae NOT DETECTED NOT DETECTED Final   Neisseria meningitidis NOT DETECTED NOT DETECTED Final   Pseudomonas aeruginosa NOT DETECTED NOT DETECTED Final   Candida albicans NOT DETECTED NOT DETECTED Final   Candida glabrata NOT DETECTED NOT DETECTED Final   Candida krusei NOT DETECTED NOT DETECTED Final   Candida parapsilosis NOT DETECTED NOT DETECTED Final   Candida tropicalis NOT DETECTED NOT DETECTED Final    Comment: Performed at [facility], [address] [place], [place] [address]  SARS Coronavirus 2  by RT PCR (hospital order, performed in [facility] hospital lab)     Status: None   Collection Time: 04/23/20  3:46 PM  Result Value Ref Range Status   SARS Coronavirus 2 NEGATIVE NEGATIVE Final    Comment: (NOTE) SARS-CoV-2 target nucleic acids are NOT DETECTED.  The SARS-CoV-2 RNA is generally detectable in upper and lower respiratory specimens during the acute phase of infection. The lowest concentration of SARS-CoV-2 viral copies this assay can detect is 250 copies / mL. A negative result does not preclude SARS-CoV-2 infection and should not be used as the sole basis for treatment or other patient management decisions.  A negative result may occur with improper specimen collection / handling, submission of specimen other than nasopharyngeal swab, presence of viral mutation(s) within the areas targeted by this assay, and inadequate number of viral copies (<250 copies / mL). A negative result must be combined with clinical observations, patient history, and epidemiological information.  Fact Sheet for Patients:   StrictlyIdeas.no  Fact Sheet for Healthcare Providers: BankingDealers.co.za  This test is not yet approved or  cleared by the [place] FDA and has been authorized for detection and/or diagnosis of SARS-CoV-2 by FDA under an Emergency Use  Authorization (EUA).  This EUA will remain in effect (meaning this test can be used) for the duration of the COVID-19 declaration under Section 564(b)(1) of the Act, 21 U.S.C. section 360bbb-3(b)(1), unless the authorization is terminated or revoked sooner.  Performed at [facility], [address] [place], [place] [address]       Lab Results  Component Value Date   PTH 48 01/30/2020   CALCIUM 8.6 (L) 05/01/2020   PHOS 3.2 01/30/2020   Albumin 3.4 Corrected calcium is 8.8+0.3 with a goal of 91            Impression:   Patient is a 69 year old Caucasian male with a past medical history of liver cirrhosis secondary to [facility] and alcohol, obstructive sleep apnea, hypertension, diabetes mellitus type 2, Barrett's disease, history of G AVE syndrome, chronic venous stasis dermatitis Who was admitted on July 24 with chief complaint of acute on chronic diastolic CHF, CKD, acute hypoxic respiratory failure, proximal atrial fibrillation, COPD with bronchitis, bacteremia due to Klebsiella pneumonia  1)Renal  AKI secondary to cardiorenal syndrome Patient has AKI on CKD Patient has CKD stage IV Patient has CKD since 2014 Patient has CKD secondary to diabetes mellitus There is possible contribution from cardiorenal syndrome/hypertension/obesity related glomerulopathy.  Patient has noted episodes of AKI. Patient creatinine has gone up now back to 3.4. This is most likely secondary to intravascular hypovolemia from severe anemia as patient hemoglobin was less than 7 yesterday  Possibly complaint of post renal. We will ask for postvoid residual  2)HTN Blood pressure is stable   3)Anemia of chronic disease  HGb is not at goal (9--11) Patient received PRBC yesterday  4) hypocalcemia Patient calcium is normal when corrected for low albumin   5) volume overload Patient was admitted with volume overload Patient is negative by around 6 L  6) electrolytes    sodium Hyponatremia secondary to hypervolemia  potassium Hypokalemia, now better    7)Acid base  Co2 at goal     Plan:    I reviewed holding IV Lasix We will hold spironolactone We will ask for post void residual Patient may need Foley. We will follow Chem-7 We will decide about restarting diuretics in the morning I did educate patient with worsening of  his GFR and possible need for renal replacement therapy    Ragna Kramlich s [facility] 05/01/2020, 10:57 AM

## 2020-05-01 NOTE — Progress Notes (Signed)
Progress Note  Patient Name: Corey West Date of Encounter: 05/01/2020  [facility] Cardiologist: Nelva Bush, MD   Subjective   Pt denies CP or dyspnea; feels edema is worse today  Inpatient Medications    Scheduled Meds: . sodium chloride   Intravenous Once  . atorvastatin  20 mg Oral Daily  . [START ON 05/03/2020] darbepoetin (ARANESP) injection - NON-DIALYSIS  100 mcg Subcutaneous Q Tue-1800  . ferrous sulfate  325 mg Oral Daily  . hydrALAZINE  50 mg Oral BID  . insulin aspart  0-20 Units Subcutaneous TID WC  . insulin aspart  0-5 Units Subcutaneous QHS  . lactulose  60 g Oral Daily  . losartan  25 mg Oral Daily  . pantoprazole  40 mg Oral BID  . potassium chloride  40 mEq Oral Daily  . rifaximin  550 mg Oral BID  . sodium chloride flush  3 mL Intravenous Q12H  . spironolactone  25 mg Oral Daily   Continuous Infusions: . sodium chloride    . albumin human 12.5 g (05/01/20 0850)  .  ceFAZolin (ANCEF) IV     PRN Meds: sodium chloride, acetaminophen, benzonatate, ipratropium-albuterol, LORazepam, ondansetron (ZOFRAN) IV, oxyCODONE, phenol, sodium chloride, sodium chloride flush, traMADol   Vital Signs    Vitals:   04/30/20 2006 04/30/20 2008 05/01/20 0348 05/01/20 0749  BP:  (!) 128/44 (!) 122/43 (!) 135/48  Pulse: 74 69 78 72  Resp: 20  18 17   Temp: 98 F (36.7 C)  97.8 F (36.6 C) 98 F (36.7 C)  TempSrc: Oral     SpO2: 100%  100% 100%  Weight:      Height:        Intake/Output Summary (Last 24 hours) at 05/01/2020 1013 Last data filed at 04/30/2020 2008 Gross per 24 hour  Intake 1000 ml  Output 800 ml  Net 200 ml   Last 3 Weights 04/30/2020 04/29/2020 04/28/2020  Weight (lbs) 280 lb 9.6 oz 287 lb 0.6 oz 288 lb 9.6 oz  Weight (kg) 127.279 kg 130.2 kg 130.908 kg  Some encounter information is confidential and restricted. Go to Review Flowsheets activity to see all data.      Telemetry    Sinus with first degree AV block- Personally  Reviewed   Physical Exam   GEN: WD WN No acute distress.   Neck: supuple Cardiac: RRR, 2/6 systolic murmur; no DM Respiratory: CTA GI: Soft, NT/ND; Pt with scrotal edema MS: 2+ edema; wrapped Neuro:  Grossly intact Psych: Normal affect   Labs    High Sensitivity Troponin:   Recent Labs  Lab 04/23/20 1525 04/23/20 1854 04/30/20 1448 04/30/20 2105  TROPONINIHS 18* 578* 46* 44*      Chemistry Recent Labs  Lab 04/29/20 0439 04/30/20 0443 05/01/20 0557  NA 137 131* 134*  K 3.6 3.6 4.1  CL 98 93* 96*  CO2 26 27 26   GLUCOSE 175* 170* 154*  BUN 69* 67* 70*  CREATININE 2.91* 2.97* 3.43*  CALCIUM 8.7* 8.8* 8.6*  GFRNONAA 21* 21* 17*  GFRAA 25* 24* 20*  ANIONGAP 13 11 12      Hematology Recent Labs  Lab 04/28/20 0530 04/28/20 0530 04/29/20 0439 04/29/20 0439 04/29/20 1205 04/30/20 0443 04/30/20 2105  WBC 4.7  --  4.7  --   --  4.6  --   RBC 2.39*  --  2.29*  --   --  2.26*  --   HGB 7.4*   < >  7.0*   < > 7.9* 6.7* 7.5*  HCT 21.9*   < > 21.0*   < > 23.4* 21.2* 23.2*  MCV 91.6  --  91.7  --   --  93.8  --   MCH 31.0  --  30.6  --   --  29.6  --   MCHC 33.8  --  33.3  --   --  31.6  --   RDW 16.3*  --  16.3*  --   --  16.2*  --   PLT 121*  --  112*  --   --  111*  --    < > = values in this interval not displayed.    Patient Profile     69 y.o.malewith history of HFpEF, permanent Afib not on [place], CKD stage IV, cirrhosis complicated by portal hypertension, DM2, CKD stage IV, HTN, HLD, chronic anemia, obesity, and OSA not on CPAP,who was admitted 7/24 due to worsening volume overload in the setting ofHFpEF. Echocardiogram this admission shows ejection fraction 50 to 55%, mild left ventricular enlargement, mild left ventricular hypertrophy, biatrial enlargement, mild to moderate aortic stenosis with mean gradient 14 mmHg and aortic valve area 1.4 cm.  Assessment & Plan    1 acute on chronic diastolic congestive heart failure-I/O-0; weight-not recorded.   Patient remains somewhant volume overloaded on examination. However renal function worse.  I will discontinue IV Lasix and ARB today.  We will likely transition to oral Demadex tomorrow.  Continue to follow renal function closely.  2 paroxysmal atrial fibrillation-patient remains in sinus rhythm.  He is not on anticoagulation due to cirrhosis/portal hypertension as well as anemia.  He is not on AV nodal blocking agents due to baseline first-degree AV block and bradycardia.  3 elevated troponin-occurring in the setting of CHF and renal insufficiency.  No chest pain.  No plans for further ischemia evaluation.  4 chronic stage IV kidney disease-renal function worse today.  Discontinue IV Lasix and ARB and follow.  Nephrology also following.  5 anemia-status post transfusion yesterday.  Per primary care.  For questions or updates, please contact [place] Please consult [email or web address] for contact info under        Signed, Kirk Ruths, MD  05/01/2020, 10:13 AM

## 2020-05-02 DIAGNOSIS — J449 Chronic obstructive pulmonary disease, unspecified: Secondary | ICD-10-CM

## 2020-05-02 LAB — CBC
HCT: 21.2 % — ABNORMAL LOW (ref 39.0–52.0)
Hemoglobin: 7.1 g/dL — ABNORMAL LOW (ref 13.0–17.0)
MCH: 30.5 pg (ref 26.0–34.0)
MCHC: 33.5 g/dL (ref 30.0–36.0)
MCV: 91 fL (ref 80.0–100.0)
Platelets: 82 10*3/uL — ABNORMAL LOW (ref 150–400)
RBC: 2.33 MIL/uL — ABNORMAL LOW (ref 4.22–5.81)
RDW: 15.9 % — ABNORMAL HIGH (ref 11.5–15.5)
WBC: 3.9 10*3/uL — ABNORMAL LOW (ref 4.0–10.5)
nRBC: 0 % (ref 0.0–0.2)

## 2020-05-02 LAB — GLUCOSE, CAPILLARY
Glucose-Capillary: 169 mg/dL — ABNORMAL HIGH (ref 70–99)
Glucose-Capillary: 268 mg/dL — ABNORMAL HIGH (ref 70–99)
Glucose-Capillary: 226 mg/dL — ABNORMAL HIGH (ref 70–99)
Glucose-Capillary: 165 mg/dL — ABNORMAL HIGH (ref 70–99)

## 2020-05-02 LAB — BASIC METABOLIC PANEL
Anion gap: 11 (ref 5–15)
BUN: 70 mg/dL — ABNORMAL HIGH (ref 8–23)
CO2: 28 mmol/L (ref 22–32)
Calcium: 8.6 mg/dL — ABNORMAL LOW (ref 8.9–10.3)
Chloride: 93 mmol/L — ABNORMAL LOW (ref 98–111)
Creatinine, Ser: 3.47 mg/dL — ABNORMAL HIGH (ref 0.61–1.24)
GFR calc Af Amer: 20 mL/min — ABNORMAL LOW (ref >60)
GFR calc non Af Amer: 17 mL/min — ABNORMAL LOW (ref >60)
Glucose, Bld: 178 mg/dL — ABNORMAL HIGH (ref 70–99)
Potassium: 4 mmol/L (ref 3.5–5.1)
Sodium: 132 mmol/L — ABNORMAL LOW (ref 135–145)

## 2020-05-02 LAB — HEMOGLOBIN AND HEMATOCRIT, BLOOD
HCT: 22.4 % — ABNORMAL LOW (ref 39.0–52.0)
Hemoglobin: 7.4 g/dL — ABNORMAL LOW (ref 13.0–17.0)

## 2020-05-02 LAB — AMMONIA: Ammonia: 28 umol/L (ref 9–35)

## 2020-05-02 LAB — MAGNESIUM: Magnesium: 1.8 mg/dL (ref 1.7–2.4)

## 2020-05-02 MED ORDER — CEFAZOLIN SODIUM-DEXTROSE 2-4 GM/100ML-% IV SOLN
2.0000 g | Freq: Two times a day (BID) | INTRAVENOUS | Status: AC
  Administered 2020-05-02: 2 g via INTRAVENOUS
  Filled 2020-05-02: qty 100

## 2020-05-02 MED ORDER — TORSEMIDE 100 MG PO TABS
100.0000 mg | ORAL_TABLET | Freq: Every day | ORAL | Status: DC
  Administered 2020-05-02 – 2020-05-03 (×2): 100 mg via ORAL
  Filled 2020-05-02 (×2): qty 1

## 2020-05-02 NOTE — Care Management Important Message (Signed)
Important Message  Patient Details  Name: Corey West MRN: [id number] Date of Birth: Feb 09, 1951   Medicare Important Message Given:  Yes  [name] said he was very familiar with the Important Message from [facility] and has received several copies during this admission. He does not want any more copies and I let him know I would note this in his medical record and he thanked me.   Juliann Pulse A Aury Scollard 05/02/2020, 2:48 PM

## 2020-05-02 NOTE — Progress Notes (Signed)
Physical Therapy Treatment Patient Details Name: Corey West MRN: [id number] DOB: Nov 27, 1950 Today's Date: 05/02/2020    History of Present Illness 69 y.o. male who came in with weakness and shortness of breath.  Medical history significant of morbid obesity, chronic lymphedema and venous stasis, recurrent UTIs.  Type 2 diabetes, CKD-4, liver cirrhosis, hx of GI bleeds, chronic iron deficiency anemia, GERD, likely OSA, chronic thrombocytopenia, HTN, HLD.  Chronic weeping and swelling of his legs.    PT Comments    Pt pleasant and motivated to participate during the session. Pt found sitting in recliner, feet on floor, nasal cannula donned and set to 1.5L O2. Pt immediately doffed nasal cannula and stated "I don't need it"; HR 73 and SpO2 97% at this point. Pt then tried to stand up impulsively and was asked to sit back down to perform some seated exercises first. Pt denied pain this afternoon; however, when asked to perform sitting LAQ, pt refused secondary to pain "down there" pointing at groin region. Nursing notified regarding O2 status and OK with ambulation trial on room air. Pt required supervision w/ sit-to-stand and ambulation. Pt ambulated 146ft with subtle right-left drifting noted; overall pt steady w/ no LOB and pt demonstrated safe use of RW throughout. Recliner appeared significantly soiled and chuck pads were replaced; pt sat back down with no difficulty. Pt continued to decline seated exercises and also declined donning O2 [place] at end of session; nursing notified. HR remained between 73-85bpm and SpO2 remained above 95% throughout session. Pt will benefit from HHPT services upon discharge to safely address deficits listed in patient problem list for decreased caregiver assistance and eventual return to PLOF.   Follow Up Recommendations  Home health PT     Equipment Recommendations  None recommended by PT    Recommendations for Other Services       Precautions / Restrictions  Precautions Precautions: Fall Restrictions Weight Bearing Restrictions: No    Mobility  Bed Mobility               General bed mobility comments: not tested today's session - pt found sitting in recliner chair  Transfers Overall transfer level: Needs assistance Equipment used: Rolling walker (2 wheeled) Transfers: Sit to/from Stand Sit to Stand: Supervision            Ambulation/Gait Ambulation/Gait assistance: Supervision Gait Distance (Feet): 150 Feet Assistive device: Rolling walker (2 wheeled) Gait Pattern/deviations: Step-through pattern;Drifts right/left Gait velocity: decreased   General Gait Details: Pt able to ambulate around nurse's station w/ subtle right/left drifting noted; pt was overall steady w/ no LOB noted and demonstrated safe use of RW   Stairs             Wheelchair Mobility    Modified Rankin (Stroke Patients Only)       Balance Overall balance assessment: Mild deficits observed, not formally tested;History of Falls                                          Cognition Arousal/Alertness: Awake/alert Behavior During Therapy: WFL for tasks assessed/performed Overall Cognitive Status: Within Functional Limits for tasks assessed                                 General Comments: decreased safety awareness and awareness of deficits; grossly oriented;  behavior grossly WFL but impulsive at times      Exercises      General Comments        Pertinent Vitals/Pain Pain Assessment: No/denies pain    Home Living                      Prior Function            PT Goals (current goals can now be found in the care plan section) Progress towards PT goals: Progressing toward goals    Frequency    Min 2X/week      PT Plan Current plan remains appropriate    Co-evaluation              AM-PAC PT "6 Clicks" Mobility   Outcome Measure  Help needed turning from your back to your  side while in a flat bed without using bedrails?: None Help needed moving from lying on your back to sitting on the side of a flat bed without using bedrails?: None Help needed moving to and from a bed to a chair (including a wheelchair)?: None Help needed standing up from a chair using your arms (e.g., wheelchair or bedside chair)?: None Help needed to walk in hospital room?: A Little Help needed climbing 3-5 steps with a railing? : A Little 6 Click Score: 22    End of Session Equipment Utilized During Treatment: Gait belt;Other (comment) (Pt found on 1.5L O2 via [place]. Pt states he does not need it.) Activity Tolerance: Patient tolerated treatment well Patient left: with call bell/phone within reach;in chair;Other (comment) (pt found w/o chair alarm set; left w/ chair alarm off but nursing notified) Nurse Communication: Mobility status PT Visit Diagnosis: Muscle weakness (generalized) (M62.81);Unsteadiness on feet (R26.81)     Time: 1440-1500 PT Time Calculation (min) (ACUTE ONLY): 20 min  Charges:                        Devora Tortorella SPT 05/02/20, 4:14 PM

## 2020-05-02 NOTE — Progress Notes (Signed)
[facility], [place] 05/02/20  Subjective:   LOS: 9 08/01 0701 - 08/02 0700 In: 1540 [P.O.:1280; I.V.:2; IV Piggyback:200] Out: 0867 [Urine:1820] Patient sitting up in chair. Still has some lower extremity edema though improved from the last time that I saw him.   Objective:  Vital signs in last 24 hours:  Temp:  [97.8 F (36.6 C)-98.2 F (36.8 C)] 97.9 F (36.6 C) (08/02 1125) Pulse Rate:  [69-83] 78 (08/02 1125) Resp:  [17-18] 17 (08/02 1125) BP: (119-140)/(42-49) 140/49 (08/02 1125) SpO2:  [100 %] 100 % (08/02 1125) Weight:  [126.6 kg] 126.6 kg (08/02 0450)  Weight change:  Filed Weights   04/29/20 0428 04/30/20 0419 05/02/20 0450  Weight: (!) 130.2 kg (!) 127.3 kg (!) 126.6 kg    Intake/Output:    Intake/Output Summary (Last 24 hours) at 05/02/2020 1414 Last data filed at 05/02/2020 6195 Gross per 24 hour  Intake 1242.01 ml  Output 1020 ml  Net 222.01 ml     Physical Exam: General:  Obese gentleman, sitting up  HEENT  moist oral mucous membranes  Pulm/lungs  normal breathing effort, decreased breath sounds at bases  CVS/Heart  S1S2 no rub  Abdomen:   Soft, obese, nontender  Extremities:  2+ pitting edema, left leg wrapped    Neurologic:  Alert, able to answer questions appropriately  Skin:  Dependent rubor RLE          Basic Metabolic Panel:  Recent Labs  Lab 04/26/20 0526 04/27/20 0256 04/28/20 0530 04/28/20 0530 04/29/20 0439 04/29/20 0439 04/30/20 0443 05/01/20 0557 05/02/20 0433  NA 138   < > 137  --  137  --  131* 134* 132*  K 3.3*   < > 3.6  --  3.6  --  3.6 4.1 4.0  CL 101   < > 98  --  98  --  93* 96* 93*  CO2 27   < > 30  --  26  --  27 26 28   GLUCOSE 155*   < > 182*  --  175*  --  170* 154* 178*  BUN 74*   < > 73*  --  69*  --  67* 70* 70*  CREATININE 3.14*   < > 2.96*  --  2.91*  --  2.97* 3.43* 3.47*  CALCIUM 8.7*   < > 8.6*   < > 8.7*   < > 8.8* 8.6* 8.6*  MG 1.7  --  1.6*  --  1.7  --  1.7  --  1.8    < > = values in this interval not displayed.     CBC: Recent Labs  Lab 04/28/20 0530 04/28/20 0530 04/29/20 0439 04/29/20 1205 04/30/20 0443 04/30/20 2105 05/01/20 1033 05/02/20 0433 05/02/20 1258  WBC 4.7  --  4.7  --  4.6  --  6.4 3.9*  --   HGB 7.4*   < > 7.0*   < > 6.7* 7.5* 7.7* 7.1* 7.4*  HCT 21.9*   < > 21.0*   < > 21.2* 23.2* 22.8* 21.2* 22.4*  MCV 91.6  --  91.7  --  93.8  --  90.5 91.0  --   PLT 121*  --  112*  --  111*  --  103* 82*  --    < > = values in this interval not displayed.      Lab Results  Component Value Date   HEPBSAG NON REACTIVE 01/30/2020   HEPBSAB  NON REACTIVE 01/30/2020   HEPBIGM NON REACTIVE 01/30/2020      Microbiology:  Recent Results (from the past 240 hour(s))  Blood Culture (routine x 2)     Status: None   Collection Time: 04/23/20  3:06 PM   Specimen: BLOOD  Result Value Ref Range Status   Specimen Description BLOOD RAC  Final   Special Requests   Final    BOTTLES DRAWN AEROBIC AND ANAEROBIC Blood Culture results may not be optimal due to an inadequate volume of blood received in culture bottles   Culture   Final    NO GROWTH 5 DAYS Performed at [facility], [address] [place], [place] [address]    Report Status 04/28/2020 FINAL  Final  Blood Culture (routine x 2)     Status: Abnormal   Collection Time: 04/23/20  3:06 PM   Specimen: BLOOD RIGHT FOREARM  Result Value Ref Range Status   Specimen Description   Final    BLOOD RIGHT FOREARM Performed at [facility], [place] [address] [place], [place] [address]    Special Requests   Final    BOTTLES DRAWN AEROBIC AND ANAEROBIC Blood Culture adequate volume Performed at [facility], [address] [place], [place] [address]    Culture  Setup Time   Final    GRAM NEGATIVE RODS IN BOTH AEROBIC AND ANAEROBIC BOTTLES CRITICAL RESULT CALLED TO, READ BACK BY AND VERIFIED WITH: SCOTT HALL 04/24/20 AT 0618 HS Performed at [facility],  [address] [place], [place] [address]    Culture KLEBSIELLA PNEUMONIAE (A)  Final   Report Status 04/26/2020 FINAL  Final   Organism ID, Bacteria KLEBSIELLA PNEUMONIAE  Final      Susceptibility   Klebsiella pneumoniae - MIC*    AMPICILLIN >=32 RESISTANT Resistant     CEFAZOLIN <=4 SENSITIVE Sensitive     CEFEPIME <=0.12 SENSITIVE Sensitive     CEFTAZIDIME <=1 SENSITIVE Sensitive     CEFTRIAXONE <=0.25 SENSITIVE Sensitive     CIPROFLOXACIN <=0.25 SENSITIVE Sensitive     GENTAMICIN <=1 SENSITIVE Sensitive     IMIPENEM <=0.25 SENSITIVE Sensitive     TRIMETH/SULFA <=20 SENSITIVE Sensitive     AMPICILLIN/SULBACTAM >=32 RESISTANT Resistant     PIP/TAZO <=4 SENSITIVE Sensitive     * KLEBSIELLA PNEUMONIAE  Blood Culture ID Panel (Reflexed)     Status: Abnormal   Collection Time: 04/23/20  3:06 PM  Result Value Ref Range Status   Enterococcus species NOT DETECTED NOT DETECTED Final   Listeria monocytogenes NOT DETECTED NOT DETECTED Final   Staphylococcus species NOT DETECTED NOT DETECTED Final   Staphylococcus aureus (BCID) NOT DETECTED NOT DETECTED Final   Streptococcus species NOT DETECTED NOT DETECTED Final   Streptococcus agalactiae NOT DETECTED NOT DETECTED Final   Streptococcus pneumoniae NOT DETECTED NOT DETECTED Final   Streptococcus pyogenes NOT DETECTED NOT DETECTED Final   Acinetobacter baumannii NOT DETECTED NOT DETECTED Final   Enterobacteriaceae species DETECTED (A) NOT DETECTED Final    Comment: Enterobacteriaceae represent a large family of gram-negative bacteria, not a single organism. CRITICAL RESULT CALLED TO, READ BACK BY AND VERIFIED WITH: SCOTT HALL 04/24/20 AT 0618 HS    Enterobacter cloacae complex NOT DETECTED NOT DETECTED Final   Escherichia coli NOT DETECTED NOT DETECTED Final   Klebsiella oxytoca NOT DETECTED NOT DETECTED Final   Klebsiella pneumoniae DETECTED (A) NOT DETECTED Final    Comment: CRITICAL RESULT CALLED TO, READ BACK BY  AND VERIFIED  WITH: SCOTT HALL 04/24/20 AT 0618 HS    Proteus species NOT DETECTED NOT DETECTED Final   Serratia marcescens NOT DETECTED NOT DETECTED Final   Carbapenem resistance NOT DETECTED NOT DETECTED Final   Haemophilus influenzae NOT DETECTED NOT DETECTED Final   Neisseria meningitidis NOT DETECTED NOT DETECTED Final   Pseudomonas aeruginosa NOT DETECTED NOT DETECTED Final   Candida albicans NOT DETECTED NOT DETECTED Final   Candida glabrata NOT DETECTED NOT DETECTED Final   Candida krusei NOT DETECTED NOT DETECTED Final   Candida parapsilosis NOT DETECTED NOT DETECTED Final   Candida tropicalis NOT DETECTED NOT DETECTED Final    Comment: Performed at [facility], [address] [place], [place] [address]  SARS Coronavirus 2 by RT PCR (hospital order, performed in [place] hospital lab)     Status: None   Collection Time: 04/23/20  3:46 PM  Result Value Ref Range Status   SARS Coronavirus 2 NEGATIVE NEGATIVE Final    Comment: (NOTE) SARS-CoV-2 target nucleic acids are NOT DETECTED.  The SARS-CoV-2 RNA is generally detectable in upper and lower respiratory specimens during the acute phase of infection. The lowest concentration of SARS-CoV-2 viral copies this assay can detect is 250 copies / mL. A negative result does not preclude SARS-CoV-2 infection and should not be used as the sole basis for treatment or other patient management decisions.  A negative result may occur with improper specimen collection / handling, submission of specimen other than nasopharyngeal swab, presence of viral mutation(s) within the areas targeted by this assay, and inadequate number of viral copies (<250 copies / mL). A negative result must be combined with clinical observations, patient history, and epidemiological information.  Fact Sheet for Patients:   StrictlyIdeas.no  Fact Sheet for Healthcare Providers: BankingDealers.co.za  This test is  not yet approved or  cleared by the [place] FDA and has been authorized for detection and/or diagnosis of SARS-CoV-2 by FDA under an Emergency Use Authorization (EUA).  This EUA will remain in effect (meaning this test can be used) for the duration of the COVID-19 declaration under Section 564(b)(1) of the Act, 21 U.S.C. section 360bbb-3(b)(1), unless the authorization is terminated or revoked sooner.  Performed at [facility], [address] [place], [place] [address]     Coagulation Studies: No results for input(s): LABPROT, INR in the last 72 hours.  Urinalysis: Recent Labs    05/01/20 1330  COLORURINE YELLOW*  LABSPEC 1.010  PHURINE 6.0  GLUCOSEU NEGATIVE  HGBUR SMALL*  BILIRUBINUR NEGATIVE  KETONESUR NEGATIVE  PROTEINUR 100*  NITRITE NEGATIVE  LEUKOCYTESUR NEGATIVE      Imaging: DG Chest Port 1 View  Result Date: 04/30/2020 CLINICAL DATA:  Shortness of breath. EXAM: PORTABLE CHEST 1 VIEW COMPARISON:  Radiograph 04/23/2020 FINDINGS: Stable cardiomegaly. Unchanged mediastinal contours. Vascular congestion which appears similar to prior exam. No acute or focal airspace disease. No pleural fluid or pneumothorax. IMPRESSION: Stable cardiomegaly and vascular congestion. No new abnormality. Electronically Signed   By: Keith Rake M.D.   On: 04/30/2020 16:46     Medications:   . sodium chloride 250 mL (05/02/20 0532)  .  ceFAZolin (ANCEF) IV 2 g (05/02/20 0532)   . sodium chloride   Intravenous Once  . atorvastatin  20 mg Oral Daily  . [START ON 05/03/2020] darbepoetin (ARANESP) injection - NON-DIALYSIS  100 mcg Subcutaneous Q Tue-1800  . ferrous sulfate  325 mg Oral Daily  . hydrALAZINE  50 mg  Oral BID  . insulin aspart  0-20 Units Subcutaneous TID WC  . insulin aspart  0-5 Units Subcutaneous QHS  . lactulose  60 g Oral Daily  . pantoprazole  40 mg Oral BID  . potassium chloride  40 mEq Oral Daily  . rifaximin  550 mg Oral BID  . sodium  chloride flush  3 mL Intravenous Q12H  . tamsulosin  0.4 mg Oral QPC supper  . torsemide  100 mg Oral Daily   sodium chloride, acetaminophen, benzonatate, ipratropium-albuterol, LORazepam, ondansetron (ZOFRAN) IV, oxyCODONE, phenol, sodium chloride, sodium chloride flush, traMADol  Assessment/ Plan:  69 y.o. male with   Principal Problem:   Sepsis ([place]) Active Problems:   Cirrhosis ([place])   Esophageal varices ([place])   Iron deficiency anemia   Chronic venous stasis dermatitis of both lower extremities   Obesity, Class III, BMI 40-49.9 (morbid obesity) ([place])   Type 2 diabetes mellitus with diabetic nephropathy, with long-term current use of insulin (HCC)   Acute on chronic diastolic CHF (congestive heart failure) (HCC)   CKD (chronic kidney disease) stage 4, GFR 15-29 ml/min (HCC)   Acute hypoxemic respiratory failure (HCC)   Paroxysmal atrial fibrillation (HCC)   COPD with chronic bronchitis ([place])   Bacteremia due to Klebsiella pneumoniae   Palliative care by specialist   DNR (do not resuscitate) discussion   #.  Acute kidney injury on CKD st 4 Recent Labs    04/29/20 0439 04/30/20 0443 05/01/20 0557 05/02/20 0433  CREATININE 2.91* 2.97* 3.43* 3.47*  AKI is likely secondary to abnormal cardiorenal hemodynamics in setting of liver cirrhosis.  Underlying CKD likely secondary to diabetes. CT abdomen without contrast on July 24 confirmed cirrhosis, portal venous hypertension, cholelithiasis, aortic atherosclerosis, three-vessel coronary disease, aortic valve calcifications, no hydronephrosis -Creatinine up a bit to 3.43.  Good urine output of 1.8 L.  Patient on high-dose torsemide 100 mg daily.  Continue to monitor renal function trend daily for now.  Hesitant to lower diuretics for now.   #. Anemia of CKD  Lab Results  Component Value Date   HGB 7.4 (L) 05/02/2020  Discussed ESA therapy with patient.  Patient has been evaluated by Dr. Grayland Ormond as outpatient who recommended IV  iron and ESA therapy.  He has also received blood transfusion as outpatient -Continue Aranesp..  #. Diabetes type 2 with CKD Hemoglobin A1C (%)  Date Value  05/10/2014 11.0 (H)   Hgb A1c MFr Bld (%)  Date Value  02/23/2020 6.9 (H)  Glycemic control per hospitalist  #Cirrhosis with volume overload Continue torsemide.  #Hypokalemia Continue potassium chloride.    LOS: 9 Zeriah Baysinger 8/2/20212:14 PM  [place], [place]

## 2020-05-02 NOTE — Progress Notes (Signed)
Progress Note  Patient Name: [name] Date of Encounter: 05/02/2020  Primary Cardiologist: Nelva Bush, MD   Subjective   Reports that he still feels he has fluid on him.  He continues to feel as if he is putting out a significant amount of volume.  He reports that he has been up to go to the bathroom but otherwise has not ambulated around the unit.  His breathing has improved but is still not yet at baseline.  No chest pain, racing heart rate, or palpitations.  He continues to note lower extremity and lower abdominal/scrotal edema.  Inpatient Medications    Scheduled Meds: . sodium chloride   Intravenous Once  . atorvastatin  20 mg Oral Daily  . [START ON 05/03/2020] darbepoetin (ARANESP) injection - NON-DIALYSIS  100 mcg Subcutaneous Q Tue-1800  . ferrous sulfate  325 mg Oral Daily  . hydrALAZINE  50 mg Oral BID  . insulin aspart  0-20 Units Subcutaneous TID WC  . insulin aspart  0-5 Units Subcutaneous QHS  . lactulose  60 g Oral Daily  . pantoprazole  40 mg Oral BID  . potassium chloride  40 mEq Oral Daily  . rifaximin  550 mg Oral BID  . sodium chloride flush  3 mL Intravenous Q12H  . tamsulosin  0.4 mg Oral QPC supper  . torsemide  100 mg Oral Daily   Continuous Infusions: . sodium chloride 250 mL (05/02/20 0532)  .  ceFAZolin (ANCEF) IV 2 g (05/02/20 0532)   PRN Meds: sodium chloride, acetaminophen, benzonatate, ipratropium-albuterol, LORazepam, ondansetron (ZOFRAN) IV, oxyCODONE, phenol, sodium chloride, sodium chloride flush, traMADol   Vital Signs    Vitals:   05/01/20 2014 05/02/20 0450 05/02/20 0741 05/02/20 1125  BP: (!) 122/43 (!) 133/44 (!) 119/42 (!) 140/49  Pulse: 69 83 69 78  Resp:   18 17  Temp:  98.2 F (36.8 C) 97.8 F (36.6 C) 97.9 F (36.6 C)  TempSrc:  Oral    SpO2: 100% 100% 100% 100%  Weight:  (!) 126.6 kg    Height:        Intake/Output Summary (Last 24 hours) at 05/02/2020 1153 Last data filed at 05/02/2020 8546 Gross per 24 hour    Intake 1482.01 ml  Output 1170 ml  Net 312.01 ml   Last 3 Weights 05/02/2020 04/30/2020 04/29/2020  Weight (lbs) 279 lb 280 lb 9.6 oz 287 lb 0.6 oz  Weight (kg) 126.554 kg 127.279 kg 130.2 kg  Some encounter information is confidential and restricted. Go to Review Flowsheets activity to see all data.      Telemetry    Normal sinus rhythm with first-degree block- Personally Reviewed  ECG    No new tracings- Personally Reviewed  Physical Exam   GEN: No acute distress.  Sitting in recliner chair next to bed Neck:  JVD difficult to assess due to body habitus Cardiac: RRR, 2/6 systolic murmur, rubs, or gallops.  Respiratory: Clear to auscultation bilaterally.  Reduced bibasilar breath sounds GI:  Firm, nontender, distended.  Scrotal edema. MS:  3+ lower extremity edema with bilateral Ace wraps and left lower extremity edema greater than that of right; No deformity. Neuro:  Nonfocal  Psych: Normal affect   Labs    High Sensitivity Troponin:   Recent Labs  Lab 04/23/20 1525 04/23/20 1854 04/30/20 1448 04/30/20 2105  TROPONINIHS 18* 578* 46* 44*      Cardiac EnzymesNo results for input(s): TROPONINI in the last 168 hours. No results  for input(s): TROPIPOC in the last 168 hours.   Chemistry Recent Labs  Lab 04/30/20 0443 05/01/20 0557 05/02/20 0433  NA 131* 134* 132*  K 3.6 4.1 4.0  CL 93* 96* 93*  CO2 27 26 28   GLUCOSE 170* 154* 178*  BUN 67* 70* 70*  CREATININE 2.97* 3.43* 3.47*  CALCIUM 8.8* 8.6* 8.6*  GFRNONAA 21* 17* 17*  GFRAA 24* 20* 20*  ANIONGAP 11 12 11      Hematology Recent Labs  Lab 04/30/20 0443 04/30/20 0443 04/30/20 2105 05/01/20 1033 05/02/20 0433  WBC 4.6  --   --  6.4 3.9*  RBC 2.26*  --   --  2.52* 2.33*  HGB 6.7*   < > 7.5* 7.7* 7.1*  HCT 21.2*   < > 23.2* 22.8* 21.2*  MCV 93.8  --   --  90.5 91.0  MCH 29.6  --   --  30.6 30.5  MCHC 31.6  --   --  33.8 33.5  RDW 16.2*  --   --  16.5* 15.9*  PLT 111*  --   --  103* 82*   < > =  values in this interval not displayed.    BNPNo results for input(s): BNP, PROBNP in the last 168 hours.   DDimer No results for input(s): DDIMER in the last 168 hours.   Radiology    DG Chest Port 1 View  Result Date: 04/30/2020 CLINICAL DATA:  Shortness of breath. EXAM: PORTABLE CHEST 1 VIEW COMPARISON:  Radiograph 04/23/2020 FINDINGS: Stable cardiomegaly. Unchanged mediastinal contours. Vascular congestion which appears similar to prior exam. No acute or focal airspace disease. No pleural fluid or pneumothorax. IMPRESSION: Stable cardiomegaly and vascular congestion. No new abnormality. Electronically Signed   By: Keith Rake M.D.   On: 04/30/2020 16:46    Cardiac Studies   Echo 04/24/20 Left ventricular ejection fraction, by estimation, is 50 to 55%. The  left ventricle has low normal function. The left ventricle has no regional  wall motion abnormalities. The left ventricular internal cavity size was  mildly dilated. There is mild  left ventricular hypertrophy. Left ventricular diastolic parameters are  consistent with Grade II diastolic dysfunction (pseudonormalization).  2. Right ventricular systolic function is normal. The right ventricular  size is normal. There is mildly elevated pulmonary artery systolic  pressure.  3. Left atrial size was moderately dilated.  4. Right atrial size was mildly dilated.  5. The mitral valve is normal in structure. Trivial mitral valve  regurgitation. No evidence of mitral stenosis.  6. The aortic valve is abnormal. Aortic valve regurgitation is not  visualized. Mild to moderate aortic valve stenosis. Aortic valve area, by  VTI measures 1.36 cm. Aortic valve mean gradient measures 14.3 mmHg.  7. The inferior vena cava is dilated in size with <50% respiratory  variability, suggesting right atrial pressure of 15 mmHg.   Patient Profile     69 y.o. male with history of HFpEF, permanent atrial fibrillation not on anticoagulation,  CKD stage IV, cirrhosis complicated by portal hypertension, DM2, hypertension, hyperlipidemia, chronic anemia, obesity, OSA not on CPAP, and who was admitted due to worsening volume overload in the setting of HFpEF.  Assessment & Plan    Acute on chronic HFpEF --Reports still not at baseline volume status.   --Still volume up on exam.   --Echo as above with EF low nl at 50-55% and NRWMA, G2DD, elevated RAP.  --EF reduced since 11/2019 admission and, given this reduced EF, recommend  further ischemic workup now or as an outpatient. At this time, no plans for ischemic workup this admission. --Diuresis has been complicated by renal function status.  IV diuresis held with renal function showing slight bump in creatinine and BUN same.   --Started torsemide 100 mg daily.  Monitor I/O, daily weights. Daily BMET. Replete electrolytes as needed. --ARB has been held in the setting of worsening renal function.   --Recommend ambulation with pulse ox before discharge.  --CHF education. Low salt diet. --He will need close follow-up of his renal function and repeat labs within 1 week of discharge. Schedule for clinic after discharge.  Paroxysmal atrial fibrillation --Not a new diagnosis for him. Previously seen for Afib by [facility]. --Remains in sinus rhythm today.   --Not on anticoagulation due to cirrhosis/portal hypertension and anemia.  Risks of anticoagulation / bleeding risk outweigh that of benefit.  CHA2DS2VASc score of at least 4 (CHF, HTN, agex1, DM2).  --Not on AV nodal blocking agents.  Consider EP evaluation if rate proves difficult to control in future due to bradycardia when in sinus rhythm.   --Recommend ambulation before discharge with pulse ox.   --Goal ventricular rate below 110. --Follow-up in clinic.  Elevated high-sensitivity troponin --No chest pain.   --EKG without acute ST/T changes.  HS Tn with peak at 578.0. --Suspect Tn elevation likely in the setting of comorbid  conditions, including HFpEF exacerbation and renal insufficiency.    --Given his reduced EF since his previous 11/2019 echo, recommend further ischemic workup now or as an outpatient. At this time, no plans for further ischemic evaluation this admission. --Follow-up as outpatient.  Mild to moderate AS --Periodic echo to monitor.   CKDIV --Close monitoring of renal function recommended with ongoing oral diuresis.  Nephrology has been following this admission.  We will plan for outpatient labs within 1 week of discharge.  Anemia s/p transfusion --Per IM, PCP.  OSA --Recommend CPAP.  For questions or updates, please contact [place] Please consult [email or web address] for contact info under        Signed, Arvil Chaco, PA-C  05/02/2020, 11:53 AM

## 2020-05-02 NOTE — Plan of Care (Signed)
  Problem: Activity: Goal: Risk for activity intolerance will decrease Outcome: Progressing   Problem: Pain Managment: Goal: General experience of comfort will improve Outcome: Progressing   

## 2020-05-02 NOTE — Progress Notes (Signed)
PROGRESS NOTE    Corey West   JKD:[id number]  DOB: 12-03-1950  PCP: Leonel Ramsay, MD    DOA: 04/23/2020 LOS: 9   Brief Narrative   Corey West is a 69 y.o. 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  He has also been having diarrhea this because he takes lactulose for his "ammonia".   He was febrile (101.8), tachycardic lactic acid was elevated at 3.0.  He was admitted to the hospital for sepsis and acute on chronic diastolic CHF with fluid overload.     Assessment & Plan   Principal Problem:   Sepsis ([place]) Active Problems:   Iron deficiency anemia   Type 2 diabetes mellitus with diabetic nephropathy, with long-term current use of insulin (HCC)   CKD (chronic kidney disease) stage 4, GFR 15-29 ml/min (HCC)   Obesity, Class III, BMI 40-49.9 (morbid obesity) (HCC)   Cirrhosis (HCC)   Esophageal varices (HCC)   Chronic venous stasis dermatitis of both lower extremities   Acute on chronic diastolic CHF (congestive heart failure) (HCC)   Acute hypoxemic respiratory failure (HCC)   Paroxysmal atrial fibrillation (HCC)   COPD with chronic bronchitis (HCC)   Bacteremia due to Klebsiella pneumoniae   Palliative care by specialist   DNR (do not resuscitate) discussion   Acute on chronic diastolic CHF/anasarca: Remains volume overloaded with edema to mid/proximal thighs, although improving.  Lasix drip stopped 8/1 AM with creatinine bump 2.97>>3.43.  Cr 3.47 today. Diuresis per cardiology and  nephrology.  Diuresis, ARB and spironolactone on hold for worsening AKI. Cardiology started on torsemide 100 mg daily.  Metolazone being considered as needed for weight gain at home. Monitor BMP, daily weights and urine output.   2D echo on 04/23/2020 showed EF estimated at 50 to 55%, mild LVH, grade 2 diastolic dysfunction.    Will need very close follow up in HF clinic and his cardiologist.    Dyspnea /tremors -Improved.  On rounds 7/31, patient very short of breath and was having intermittent shaking tremors, reported feeling quite anxious.  Had been given Ativan only 5 minutes earlier.  Troponin minimally elevated at 46>>44, no chest pain.  EKG without ischemic changes.  Chest xray stable cardiomegaly and vascular congestion.  Dysuria - reported on 8/1.  UA is pending.    Sepsis secondary to klebsiella pneumonia bacteremia:  Continue IV Ancef.  Day 7 of antibiotics.  Liver cirrhosis with anasarca, history of esophageal varices: Continue diuretics per consultants, rifaximin and lactulose. Spironolactone held as of today given worsened renal function.  Elevated troponin: This is likely due to demand ischemia.  Patient has been evaluated by cardiologist.  AKI on CKD stage IV: Creatinine worsened 2.97>>3.43 after 2 days Lasix infusion and worsened anemia requiring transfusion.  AKI is likely secondary to cardiorenal syndrome as it is improving with diuresis.  PVR's requested to monitor for any retention causing post-renal azotemia.  Monitor BMP.    Nephrology is following.     Chronic iron deficiency anemia - received iron infusion 7/30, 1 unit RBC's transfused on 7/31 for Hbg 6.7.  This AM Hbg 7.1,  afternoon repeat 7.4.   Monitor closely for signs of bleeding.  Continue oral iron supplement.  Insulin-dependent diabetes mellitus, episode of hypoglycemia on 04/23/2020: NovoLog as needed for hyperglycemia.  Hypertension: Continue antihypertensives.  Stopped amlodipine given his edema,  increased hydralazine.   Hypokalemia: Improving.    Daily BMP to monitor, replace as needed.  Paroxysmal atrial fibrillation: Not on anticoagulation because of liver disease, kidney disease, thrombocytopenia and chronic anemia.  Monitor on telemetry.  Chronic venous stasis of bilateral lower extremities: Continue leg wraps, wound care as needed  Goals of care: Consulted palliative care because of patient's multiple comorbidities including CHF, advanced kidney disease, liver cirrhosis with anasarca.  Patient met with palliative, continue full scope of care, full code.  Set some time limit for intubation.  Niece would be surrogate decision maker.  Declines to go to SNF, thinks he can manage fine at home on his own.  Agreeable to dialysis if needed.     Morbid obesity: Body mass index is 40.03 kg/m.  Complicates overall care and prognosis  DVT prophylaxis: Place and maintain sequential compression device Start: 04/30/20 1101   Diet:  Diet Orders (From admission, onward)    Start     Ordered   04/23/20 1934  Diet heart healthy/carb modified Room service appropriate? Yes; Fluid consistency: Thin  Diet effective now       Question Answer Comment  Diet-HS Snack? Nothing   Room service appropriate? Yes   Fluid consistency: Thin      04/23/20 1936            Code Status: Full Code    Subjective 05/02/20    Patient seen up in chair this morning.  No acute events reported overnight.  He says he is feeling better today (looks much better).  Says that his breathing and edema are stable since being off Lasix infusion since yesterday morning.  He denies chest pain or shortness of breath, tremors, fevers or chills.  Says he is having less groin pain that he had mentioned yesterday.   Disposition Plan & Communication   Status is: Inpatient  Remains inpatient appropriate because:IV treatments appropriate due to intensity of illness or inability to take PO.  Plan to monitor on oral  diuretic and possible discharge tomorrow.   Dispo: The patient is from: Home              Anticipated d/c is to: Home with [facility], declines SNF              Anticipated d/c date is: 1 day              Patient currently is not medically stable to d/c.   Family Communication: None at bedside, will attempt to call niece.     Consults, Procedures, Significant Events   Consultants:   Cardiology  Nephrology  Palliative care  Procedures:   None  Antimicrobials:   Ancef   Objective   Vitals:   05/01/20 1547 05/01/20 2014 05/02/20 0450 05/02/20 0741  BP: (!) 137/47 (!) 122/43 (!) 133/44 (!) 119/42  Pulse: 70 69 83 69  Resp: 18   18  Temp: 97.9 F (36.6 C)  98.2 F (36.8 C) 97.8 F (36.6 C)  TempSrc:   Oral   SpO2: 100% 100% 100% 100%  Weight:   (!) 126.6 kg   Height:        Intake/Output Summary (Last 24 hours) at 05/02/2020 0908 Last data filed at 05/02/2020 4268 Gross per  24 hour  Intake 1482.01 ml  Output 1600 ml  Net -117.99 ml   Filed Weights   04/29/20 0428 04/30/20 0419 05/02/20 0450  Weight: (!) 130.2 kg (!) 127.3 kg (!) 126.6 kg    Physical Exam:  General exam: awake, alert, moderate distress, morbidly obese Respiratory system: CTAB but diminished due to body habitus, no crackles, normal respiratory effort Cardiovascular system: normal S1/S2, RRR, edema in his thighs appears mildly improved Extremities: moves all, bilateral lower extremities in wraps with proximal pitting edema improving Psychiatry: Normal mood, congruent affect  Labs   Data Reviewed: I have personally reviewed following labs and imaging studies  CBC: Recent Labs  Lab 04/28/20 0530 04/28/20 0530 04/29/20 0439 04/29/20 0439 04/29/20 1205 04/30/20 0443 04/30/20 2105 05/01/20 1033 05/02/20 0433  WBC 4.7  --  4.7  --   --  4.6  --  6.4 3.9*  HGB 7.4*   < > 7.0*   < > 7.9* 6.7* 7.5* 7.7* 7.1*  HCT 21.9*   < > 21.0*   < > 23.4* 21.2* 23.2* 22.8* 21.2*  MCV 91.6  --  91.7  --    --  93.8  --  90.5 91.0  PLT 121*  --  112*  --   --  111*  --  103* 82*   < > = values in this interval not displayed.   Basic Metabolic Panel: Recent Labs  Lab 04/26/20 0526 04/27/20 0256 04/28/20 0530 04/29/20 0439 04/30/20 0443 05/01/20 0557 05/02/20 0433  NA 138   < > 137 137 131* 134* 132*  K 3.3*   < > 3.6 3.6 3.6 4.1 4.0  CL 101   < > 98 98 93* 96* 93*  CO2 27   < > '30 26 27 26 28  '$ GLUCOSE 155*   < > 182* 175* 170* 154* 178*  BUN 74*   < > 73* 69* 67* 70* 70*  CREATININE 3.14*   < > 2.96* 2.91* 2.97* 3.43* 3.47*  CALCIUM 8.7*   < > 8.6* 8.7* 8.8* 8.6* 8.6*  MG 1.7  --  1.6* 1.7 1.7  --  1.8   < > = values in this interval not displayed.   GFR: Estimated Creatinine Clearance: 27.2 mL/min (A) (by C-G formula based on SCr of 3.47 mg/dL (H)). Liver Function Tests: No results for input(s): AST, ALT, ALKPHOS, BILITOT, PROT, ALBUMIN in the last 168 hours. No results for input(s): LIPASE, AMYLASE in the last 168 hours. No results for input(s): AMMONIA in the last 168 hours. Coagulation Profile: No results for input(s): INR, PROTIME in the last 168 hours. Cardiac Enzymes: No results for input(s): CKTOTAL, CKMB, CKMBINDEX, TROPONINI in the last 168 hours. BNP (last 3 results) No results for input(s): PROBNP in the last 8760 hours. HbA1C: No results for input(s): HGBA1C in the last 72 hours. CBG: Recent Labs  Lab 05/01/20 0751 05/01/20 1126 05/01/20 1547 05/01/20 2101 05/02/20 0741  GLUCAP 158* 193* 256* 192* 169*   Lipid Profile: No results for input(s): CHOL, HDL, LDLCALC, TRIG, CHOLHDL, LDLDIRECT in the last 72 hours. Thyroid Function Tests: No results for input(s): TSH, T4TOTAL, FREET4, T3FREE, THYROIDAB in the last 72 hours. Anemia Panel: No results for input(s): VITAMINB12, FOLATE, FERRITIN, TIBC, IRON, RETICCTPCT in the last 72 hours. Sepsis Labs: No results for input(s): PROCALCITON, LATICACIDVEN in the last 168 hours.  Recent Results (from the past 240  hour(s))  Blood Culture (routine x 2)     Status: None  Collection Time: 04/23/20  3:06 PM   Specimen: BLOOD  Result Value Ref Range Status   Specimen Description BLOOD RAC  Final   Special Requests   Final    BOTTLES DRAWN AEROBIC AND ANAEROBIC Blood Culture results may not be optimal due to an inadequate volume of blood received in culture bottles   Culture   Final    NO GROWTH 5 DAYS Performed at [facility], [address] [place], [place] [address]    Report Status 04/28/2020 FINAL  Final  Blood Culture (routine x 2)     Status: Abnormal   Collection Time: 04/23/20  3:06 PM   Specimen: BLOOD RIGHT FOREARM  Result Value Ref Range Status   Specimen Description   Final    BLOOD RIGHT FOREARM Performed at [facility], [place] [address], [place], [place] [address]    Special Requests   Final    BOTTLES DRAWN AEROBIC AND ANAEROBIC Blood Culture adequate volume Performed at [facility], [address] [place], [place] [address]    Culture  Setup Time   Final    GRAM NEGATIVE RODS IN BOTH AEROBIC AND ANAEROBIC BOTTLES CRITICAL RESULT CALLED TO, READ BACK BY AND VERIFIED WITH: SCOTT HALL 04/24/20 AT 0618 HS Performed at [facility], [address] [place], [place] [address]    Culture KLEBSIELLA PNEUMONIAE (A)  Final   Report Status 04/26/2020 FINAL  Final   Organism ID, Bacteria KLEBSIELLA PNEUMONIAE  Final      Susceptibility   Klebsiella pneumoniae - MIC*    AMPICILLIN >=32 RESISTANT Resistant     CEFAZOLIN <=4 SENSITIVE Sensitive     CEFEPIME <=0.12 SENSITIVE Sensitive     CEFTAZIDIME <=1 SENSITIVE Sensitive     CEFTRIAXONE <=0.25 SENSITIVE Sensitive     CIPROFLOXACIN <=0.25 SENSITIVE Sensitive     GENTAMICIN <=1 SENSITIVE Sensitive     IMIPENEM <=0.25 SENSITIVE Sensitive     TRIMETH/SULFA <=20 SENSITIVE Sensitive     AMPICILLIN/SULBACTAM >=32 RESISTANT Resistant     PIP/TAZO <=4 SENSITIVE Sensitive     * KLEBSIELLA PNEUMONIAE  Blood  Culture ID Panel (Reflexed)     Status: Abnormal   Collection Time: 04/23/20  3:06 PM  Result Value Ref Range Status   Enterococcus species NOT DETECTED NOT DETECTED Final   Listeria monocytogenes NOT DETECTED NOT DETECTED Final   Staphylococcus species NOT DETECTED NOT DETECTED Final   Staphylococcus aureus (BCID) NOT DETECTED NOT DETECTED Final   Streptococcus species NOT DETECTED NOT DETECTED Final   Streptococcus agalactiae NOT DETECTED NOT DETECTED Final   Streptococcus pneumoniae NOT DETECTED NOT DETECTED Final   Streptococcus pyogenes NOT DETECTED NOT DETECTED Final   Acinetobacter baumannii NOT DETECTED NOT DETECTED Final   Enterobacteriaceae species DETECTED (A) NOT DETECTED Final    Comment: Enterobacteriaceae represent a large family of gram-negative bacteria, not a single organism. CRITICAL RESULT CALLED TO, READ BACK BY AND VERIFIED WITH: SCOTT HALL 04/24/20 AT 0618 HS    Enterobacter cloacae complex NOT DETECTED NOT DETECTED Final   Escherichia coli NOT DETECTED NOT DETECTED Final   Klebsiella oxytoca NOT DETECTED NOT DETECTED Final   Klebsiella pneumoniae DETECTED (A) NOT DETECTED Final    Comment: CRITICAL RESULT CALLED TO, READ BACK BY AND VERIFIED WITH: SCOTT HALL 04/24/20 AT 0618 HS    Proteus species NOT DETECTED NOT DETECTED Final   Serratia marcescens NOT DETECTED NOT DETECTED Final   Carbapenem resistance NOT DETECTED NOT DETECTED Final  Haemophilus influenzae NOT DETECTED NOT DETECTED Final   Neisseria meningitidis NOT DETECTED NOT DETECTED Final   Pseudomonas aeruginosa NOT DETECTED NOT DETECTED Final   Candida albicans NOT DETECTED NOT DETECTED Final   Candida glabrata NOT DETECTED NOT DETECTED Final   Candida krusei NOT DETECTED NOT DETECTED Final   Candida parapsilosis NOT DETECTED NOT DETECTED Final   Candida tropicalis NOT DETECTED NOT DETECTED Final    Comment: Performed at [facility], [address] [place], [place] [address]  SARS  Coronavirus 2 by RT PCR (hospital order, performed in [place] hospital lab)     Status: None   Collection Time: 04/23/20  3:46 PM  Result Value Ref Range Status   SARS Coronavirus 2 NEGATIVE NEGATIVE Final    Comment: (NOTE) SARS-CoV-2 target nucleic acids are NOT DETECTED.  The SARS-CoV-2 RNA is generally detectable in upper and lower respiratory specimens during the acute phase of infection. The lowest concentration of SARS-CoV-2 viral copies this assay can detect is 250 copies / mL. A negative result does not preclude SARS-CoV-2 infection and should not be used as the sole basis for treatment or other patient management decisions.  A negative result may occur with improper specimen collection / handling, submission of specimen other than nasopharyngeal swab, presence of viral mutation(s) within the areas targeted by this assay, and inadequate number of viral copies (<250 copies / mL). A negative result must be combined with clinical observations, patient history, and epidemiological information.  Fact Sheet for Patients:   StrictlyIdeas.no  Fact Sheet for Healthcare Providers: BankingDealers.co.za  This test is not yet approved or  cleared by the [place] FDA and has been authorized for detection and/or diagnosis of SARS-CoV-2 by FDA under an Emergency Use Authorization (EUA).  This EUA will remain in effect (meaning this test can be used) for the duration of the COVID-19 declaration under Section 564(b)(1) of the Act, 21 U.S.C. section 360bbb-3(b)(1), unless the authorization is terminated or revoked sooner.  Performed at [facility], [address] [place], [place] [address]       Imaging Studies   DG Chest [place] 1 View  Result Date: 04/30/2020 CLINICAL DATA:  Shortness of breath. EXAM: PORTABLE CHEST 1 VIEW COMPARISON:  Radiograph 04/23/2020 FINDINGS: Stable cardiomegaly. Unchanged mediastinal contours.  Vascular congestion which appears similar to prior exam. No acute or focal airspace disease. No pleural fluid or pneumothorax. IMPRESSION: Stable cardiomegaly and vascular congestion. No new abnormality. Electronically Signed   By: Keith Rake M.D.   On: 04/30/2020 16:46     Medications   Scheduled Meds: . sodium chloride   Intravenous Once  . atorvastatin  20 mg Oral Daily  . [START ON 05/03/2020] darbepoetin (ARANESP) injection - NON-DIALYSIS  100 mcg Subcutaneous Q Tue-1800  . ferrous sulfate  325 mg Oral Daily  . hydrALAZINE  50 mg Oral BID  . insulin aspart  0-20 Units Subcutaneous TID WC  . insulin aspart  0-5 Units Subcutaneous QHS  . lactulose  60 g Oral Daily  . pantoprazole  40 mg Oral BID  . potassium chloride  40 mEq Oral Daily  . rifaximin  550 mg Oral BID  . sodium chloride flush  3 mL Intravenous Q12H  . tamsulosin  0.4 mg Oral QPC supper   Continuous Infusions: . sodium chloride 250 mL (05/02/20 0532)  .  ceFAZolin (ANCEF) IV 2 g (05/02/20 0532)       LOS: 9 days    Time spent:  26 minutes with greater than 50% spent in coordination of care and direct patient contact    Ezekiel Slocumb, DO [facility]  05/02/2020, 9:08 AM    If 7PM-7AM, please contact night-coverage. How to contact the [facility] Attending or Consulting provider [place] or covering provider during after hours [place], for this patient?    1. Check the care team in [facility] and look for a) attending/consulting [facility] provider listed and b) the [facility] team listed 2. Log into [email or web address] and use [place]'s universal password to access. If you do not have the password, please contact the hospital operator. 3. Locate the [facility] provider you are looking for under [facility] and page to a number that you can be directly reached. 4. If you still have difficulty reaching the provider, please page the [facility] (Director on Call) for the Hospitalists listed on amion for assistance.

## 2020-05-03 DIAGNOSIS — I4819 Other persistent atrial fibrillation: Secondary | ICD-10-CM

## 2020-05-03 LAB — CBC
HCT: 20.6 % — ABNORMAL LOW (ref 39.0–52.0)
Hemoglobin: 7 g/dL — ABNORMAL LOW (ref 13.0–17.0)
MCH: 30.8 pg (ref 26.0–34.0)
MCHC: 34 g/dL (ref 30.0–36.0)
MCV: 90.7 fL (ref 80.0–100.0)
Platelets: 85 10*3/uL — ABNORMAL LOW (ref 150–400)
RBC: 2.27 MIL/uL — ABNORMAL LOW (ref 4.22–5.81)
RDW: 15.5 % (ref 11.5–15.5)
WBC: 3.6 10*3/uL — ABNORMAL LOW (ref 4.0–10.5)
nRBC: 0 % (ref 0.0–0.2)

## 2020-05-03 LAB — HEMOGLOBIN AND HEMATOCRIT, BLOOD
HCT: 22.1 % — ABNORMAL LOW (ref 39.0–52.0)
Hemoglobin: 7.2 g/dL — ABNORMAL LOW (ref 13.0–17.0)

## 2020-05-03 LAB — BASIC METABOLIC PANEL
Anion gap: 10 (ref 5–15)
BUN: 72 mg/dL — ABNORMAL HIGH (ref 8–23)
CO2: 29 mmol/L (ref 22–32)
Calcium: 9 mg/dL (ref 8.9–10.3)
Chloride: 95 mmol/L — ABNORMAL LOW (ref 98–111)
Creatinine, Ser: 3.21 mg/dL — ABNORMAL HIGH (ref 0.61–1.24)
GFR calc Af Amer: 22 mL/min — ABNORMAL LOW (ref >60)
GFR calc non Af Amer: 19 mL/min — ABNORMAL LOW (ref >60)
Glucose, Bld: 183 mg/dL — ABNORMAL HIGH (ref 70–99)
Potassium: 4.1 mmol/L (ref 3.5–5.1)
Sodium: 134 mmol/L — ABNORMAL LOW (ref 135–145)

## 2020-05-03 LAB — GLUCOSE, CAPILLARY
Glucose-Capillary: 166 mg/dL — ABNORMAL HIGH (ref 70–99)
Glucose-Capillary: 166 mg/dL — ABNORMAL HIGH (ref 70–99)
Glucose-Capillary: 307 mg/dL — ABNORMAL HIGH (ref 70–99)

## 2020-05-03 MED ORDER — FERROUS SULFATE 325 (65 FE) MG PO TABS: 325.0000 mg | ORAL_TABLET | Freq: Every day | ORAL | 1 refills | Status: DC

## 2020-05-03 MED ORDER — HYDRALAZINE HCL 50 MG PO TABS: 50.0000 mg | ORAL_TABLET | Freq: Two times a day (BID) | ORAL | 1 refills | Status: DC

## 2020-05-03 MED ORDER — TORSEMIDE 100 MG PO TABS: 100.0000 mg | ORAL_TABLET | Freq: Every day | ORAL | 1 refills | Status: DC

## 2020-05-03 MED ORDER — TAMSULOSIN HCL 0.4 MG PO CAPS: 0.4000 mg | ORAL_CAPSULE | Freq: Every day | ORAL | 1 refills | Status: DC

## 2020-05-03 MED ORDER — LACTULOSE 10 GM/15ML PO SOLN: 60.0000 g | Freq: Every day | ORAL | 0 refills | Status: DC

## 2020-05-03 NOTE — Discharge Summary (Signed)
Physician Discharge Summary  Corey West NQC:[id number] DOB: May 20, 1951 DOA: 04/23/2020  PCP: Mick Sell, MD  Admit date: 04/23/2020 Discharge date: 05/03/2020  Admitted From: home Disposition:  home  Recommendations for Outpatient Follow-up:  1. Follow up with PCP in 1-2 weeks 2. Please obtain BMP/CBC in one week 3. Please follow up with cardiology within 1 week - [facility].  Home Health: [place], [place]  Equipment/Devices: none   Discharge Condition: stable  CODE STATUS: full  Diet recommendation: Heart Healthy / Carb Modified    Discharge Diagnoses: Principal Problem:   Sepsis (HCC) Active Problems:   Iron deficiency anemia   Type 2 diabetes mellitus with diabetic nephropathy, with long-term current use of insulin (HCC)   CKD (chronic kidney disease) stage 4, GFR 15-29 ml/min (HCC)   Obesity, Class III, BMI 40-49.9 (morbid obesity) (HCC)   Cirrhosis (HCC)   Esophageal varices (HCC)   Chronic venous stasis dermatitis of both lower extremities   Acute on chronic diastolic CHF (congestive heart failure) (HCC)   Acute hypoxemic respiratory failure (HCC)   Paroxysmal atrial fibrillation (HCC)   COPD with chronic bronchitis (HCC)   Bacteremia due to Klebsiella pneumoniae   Palliative care by specialist   DNR (do not resuscitate) discussion    Summary of HPI and Hospital Course:  Corey West is a 69 y.o. 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  He has also been having diarrhea this because he takes  lactulose for his "ammonia".   He was febrile (101.8), tachycardic lactic acid was elevated at 3.0.  He was admitted to the hospital for sepsis and acute on chronic diastolic CHF with fluid overload.    Acute on chronic diastolic CHF/anasarca: Patient was persistently volume overloaded with edema to mid/proximal thighs, although did improve after several days IV diuresis including Lasix infusion.   Diuresis was complicated by AKI.  Cardiology and nephrology consulted.   Diuresis, ARB and spironolactone on hold for worsening AKI. Cardiology started on torsemide 100 mg daily.   Metolazone being considered as needed for weight gain at home. Monitor BMP, daily weights and urine output.  2D echo on 04/23/2020 showed EF estimated at 50 to 55%, mild LVH, grade 2 diastolic dysfunction.   Very close follow up in HF clinic and cardiology.    Dyspnea /tremors -Improved.  On rounds 7/31, patient very short of breath and was having intermittent shaking tremors, reported feeling quite anxious.  Had been given Ativan only 5 minutes earlier.  Troponin minimally elevated at 46>>44, no chest pain.  EKG without ischemic changes.  Chest xray stable cardiomegaly and vascular congestion.  Resolved by next day.  Likely was due to anxiety about approaching discharge home.  Dysuria - reported on 8/1.  UA negative.  Resolved.  Sepsissecondary to klebsiella pneumonia bacteremia: Completed 7 day course of Ancef.    Liver cirrhosis with anasarca, history of esophageal varices: Continue diuretics per consultants, rifaximin and lactulose. Spironolactone held given worsened renal function.  Elevated troponin: This is likely due to demand ischemia. Patient has been evaluated by cardiologist.  AKI on CKD stage TY:EUWSTDCSIN worsened  2.97>>3.43 after 2 days Lasix infusion and worsened anemia requiring transfusion.  AKI is likely secondary to cardiorenal syndrome as it is improving with diuresis.  PVR's requested to  monitor for any retention causing post-renal azotemia.Follow up BMP within 1 week.  Nephrology consulted.  Chronic iron deficiency anemia - received iron infusion 7/30, 1 unit RBC's transfused on 7/31 for Hbg 6.7.  Continue oral iron supplement.  Hemoglobin stabile in low 7's after transfusion.  Insulin-dependent diabetes mellitus, episode of hypoglycemia on 04/23/2020: NovoLog as needed for hyperglycemia.  Hypertension: Continue antihypertensives.  Stopped amlodipine given his edema, increased hydralazine.  Hypokalemia: Recurrent due to diuresis.  Replaced as needed.  Follow up BMP, replace as needed.  Paroxysmal atrial fibrillation: Not on anticoagulation because of liver disease, kidney disease, thrombocytopenia and chronic anemia.    Chronic venous stasis of bilateral lower extremities: Continue leg wraps, wound care as needed  Goals of care: Consultedpalliative care because of patient's multiple comorbidities including CHF, advanced kidney disease, liver cirrhosis with anasarca.  Patient met with palliative, continue full scope of care, full code.  Set some time limit for intubation.  Niece would be surrogate decision maker.  Declines to go to SNF, thinks he can manage fine at home on his own.  Agreeable to dialysis if needed.     Morbid obesity: Body mass index is 40.03 kg/m.  Complicates overall care and prognosis    Discharge Instructions   Discharge Instructions    (HEART FAILURE PATIENTS) Call MD:  Anytime you have any of the following symptoms: 1) 3 pound weight gain in 24 hours or 5 pounds in 1 week 2) shortness of breath, with or without a dry hacking cough 3) swelling in the hands, feet or stomach 4) if you have to sleep on extra pillows at night in order to breathe.   Complete by: As directed    Call MD for:  extreme fatigue   Complete by: As directed    Call MD for:  persistant dizziness or light-headedness   Complete by: As directed    Call MD for:   persistant nausea and vomiting   Complete by: As directed    Call MD for:  severe uncontrolled pain   Complete by: As directed    Call MD for:  temperature >100.4   Complete by: As directed    Diet - low sodium heart healthy   Complete by: As directed    Discharge wound care:   Complete by: As directed    Remove dressings and wash legs with soap and water, rinse and pat dry.  Dry between toes.  Cover wounds with double-folded layer of xerofoam gauze.  Top with ABD pad and secure with Kerlix roll gauze from just below toes to just below knees. Top with ACE wraps.  Change leg wraps on Mondays and Fridays.   Increase activity slowly   Complete by: As directed      Allergies as of 05/03/2020   No Known Allergies     Medication List    STOP taking these medications   amLODipine 5 MG tablet Commonly known as: NORVASC   furosemide 40 MG tablet Commonly known as: LASIX   losartan 25 MG tablet Commonly known as: COZAAR   metolazone 5 MG tablet Commonly known as: ZAROXOLYN   spironolactone 25 MG tablet Commonly known as: ALDACTONE     TAKE these medications   atorvastatin 20 MG tablet Commonly known as: LIPITOR Take 20 mg by mouth daily.  Combivent Respimat 20-100 MCG/ACT Aers respimat Generic drug: Ipratropium-Albuterol Inhale 1 puff into the lungs every 6 (six) hours.   ferrous sulfate 325 (65 FE) MG tablet Take 1 tablet (325 mg total) by mouth daily. Start taking on: May 04, 2020 What changed:   medication strength  when to take this   hydrALAZINE 50 MG tablet Commonly known as: APRESOLINE Take 1 tablet (50 mg total) by mouth 2 (two) times daily. What changed:   medication strength  how much to take  when to take this   lactulose 10 GM/15ML solution Commonly known as: CHRONULAC Take 90 mLs (60 g total) by mouth daily. Start taking on: May 04, 2020 What changed:   how much to take  when to take this   LORazepam 0.5 MG tablet Commonly known as:  ATIVAN Take 1 tablet (0.5 mg total) by mouth 3 (three) times daily as needed for Anxiety   multivitamin tablet Take 1 tablet by mouth daily.   NovoLOG Mix 70/30 FlexPen (70-30) 100 UNIT/ML FlexPen Generic drug: insulin aspart protamine - aspart as directed. 0-84 Units Subcutaneous 2 times Daily before meals   pantoprazole 40 MG tablet Commonly known as: Protonix Take 1 tablet (40 mg total) by mouth 2 (two) times daily.   potassium chloride 10 MEQ tablet Commonly known as: KLOR-CON Take 10 mEq by mouth daily.   rifaximin 550 MG Tabs tablet Commonly known as: XIFAXAN Take 1 tablet (550 mg total) by mouth 2 (two) times daily.   sodium chloride 0.65 % Soln nasal spray Commonly known as: OCEAN Place 1 spray into both nostrils as needed for congestion (nose irritation).   tamsulosin 0.4 MG Caps capsule Commonly known as: FLOMAX Take 1 capsule (0.4 mg total) by mouth daily after supper.   torsemide 100 MG tablet Commonly known as: DEMADEX Take 1 tablet (100 mg total) by mouth daily. Start taking on: May 04, 2020   traMADol 50 MG tablet Commonly known as: ULTRAM Take 1 tablet (50 mg total) by mouth every 8 (eight) hours as needed for moderate pain.            Discharge Care Instructions  (From admission, onward)         Start     Ordered   05/03/20 0000  Discharge wound care:       Comments: Remove dressings and wash legs with soap and water, rinse and pat dry.  Dry between toes.  Cover wounds with double-folded layer of xerofoam gauze.  Top with ABD pad and secure with Kerlix roll gauze from just below toes to just below knees. Top with ACE wraps.  Change leg wraps on Mondays and Fridays.   05/03/20 1128          Follow-up Information    [facility] Follow up on 05/20/2020.   Specialty: Cardiology Why: at 2:00pm. Enter through the [place] entrance Contact information: [place] [phone]       Loel Dubonnet, NP Follow up on 05/06/2020.   Specialty: Cardiology Why: 3:30 PM Contact information: [place] [address] [phone]              No Known Allergies  Consultations:  Cariology  Nephrology   Procedures/Studies: CT ABDOMEN PELVIS WO CONTRAST  Result Date: 04/23/2020 CLINICAL DATA:  69 year old male with history of abdominal pain and fever. EXAM: CT ABDOMEN AND PELVIS WITHOUT  CONTRAST TECHNIQUE: Multidetector CT imaging of the abdomen and pelvis was performed following the standard protocol without IV contrast. COMPARISON:  No priors. FINDINGS: Lower chest: Trace left pleural effusion lying dependently. Diffuse low attenuation throughout the intravascular compartment, suggestive of anemia. Atherosclerotic calcifications in the left anterior descending, left circumflex and right coronary arteries. Calcifications of the aortic valve. Hepatobiliary: Liver has a shrunken appearance and nodular contour, indicative of underlying cirrhosis. Calcified granulomas are noted in the liver. No discrete cystic or solid hepatic lesions are other identified on today's noncontrast CT examination. Calcified gallstones are noted lying dependently in the gallbladder measuring up to 9 mm. Gallbladder is not distended. Pancreas: No definite pancreatic mass or peripancreatic fluid collections or inflammatory changes are noted on today's noncontrast CT examination. Spleen: Spleen is mildly enlarged measuring 16.7 x 7.1 x 16.9 cm (estimated splenic volume of 1,002 mL) . Adrenals/Urinary Tract: There are no abnormal calcifications within the collecting system of either kidney, along the course of either ureter, or within the lumen of the urinary bladder. No hydroureteronephrosis or perinephric stranding to suggest urinary tract obstruction at this time. The unenhanced appearance of the kidneys is unremarkable bilaterally.  Unenhanced appearance of the urinary bladder is normal. Bilateral adrenal glands are normal in appearance. Stomach/Bowel: Unenhanced appearance of the stomach is normal. No pathologic dilatation of small bowel or colon. Appendix is not confidently identified may be surgically absent. Vascular/Lymphatic: Aortic atherosclerosis. Numerous portosystemic collateral pathways, most notable for large splenorenal collateral vessels. No lymphadenopathy noted in the abdomen or pelvis. Reproductive: Prostate gland and seminal vesicles are unremarkable in appearance. Large right-sided hydrocele incompletely imaged. Other: Small umbilical hernia containing only omental fat. Small volume of ascites. No pneumoperitoneum. Musculoskeletal: There are no aggressive appearing lytic or blastic lesions noted in the visualized portions of the skeleton. IMPRESSION: 1. Morphologic changes in the liver compatible with cirrhosis. This is associated with evidence of portal venous hypertension, including splenomegaly and numerous portosystemic collateral vessels. 2. Cholelithiasis without evidence of acute cholecystitis at this time. Small umbilical hernia containing only omental fat large right-sided hydrocele incompletely imaged. 3. Trace left pleural effusion lying dependent 4. Diffuse low attenuation throughout the intravascular compartment, suggesting anemia. 5. Aortic atherosclerosis, in addition to least 3 vessel coronary artery disease. Please note that although the presence of coronary artery calcium documents the presence of coronary artery disease, the severity of this disease and any potential stenosis cannot be assessed on this non-gated CT examination. Assessment for potential risk factor modification, dietary therapy or pharmacologic therapy may be warranted, if clinically indicated. 6. There are calcifications of the aortic valve. Echocardiographic correlation for evaluation of potential valvular dysfunction may be warranted if  clinically indicated. 7. Additional incidental findings, as above. Electronically Signed   By: Vinnie Langton M.D.   On: 04/23/2020 18:44   DG Chest Port 1 View  Result Date: 04/30/2020 CLINICAL DATA:  Shortness of breath. EXAM: PORTABLE CHEST 1 VIEW COMPARISON:  Radiograph 04/23/2020 FINDINGS: Stable cardiomegaly. Unchanged mediastinal contours. Vascular congestion which appears similar to prior exam. No acute or focal airspace disease. No pleural fluid or pneumothorax. IMPRESSION: Stable cardiomegaly and vascular congestion. No new abnormality. Electronically Signed   By: Keith Rake M.D.   On: 04/30/2020 16:46   DG Chest Port 1 View  Result Date: 04/23/2020 CLINICAL DATA:  Dyspnea, worsening shortness of breath and productive cough EXAM: PORTABLE CHEST 1 VIEW COMPARISON:  Radiograph 03/10/2020, CT 12/14/2019 FINDINGS: Chronically low lung volumes with some central vascular congestion similar  to the comparison exam. No convincing features of frank edema at this time. No new focal areas of consolidation. No effusion or pneumothorax is visible. Mild cardiomegaly is similar to prior accounting for differences in technique. Telemetry leads overlie the chest. No acute osseous or soft tissue abnormality. Degenerative changes are present in the imaged spine and shoulders. Dextrocurvature of the thoracic spine similar to prior. IMPRESSION: Chronically low lung volumes with some central vascular congestion. No convincing features of frank edema or other acute cardiopulmonary abnormality. Stable cardiomegaly. Electronically Signed   By: Lovena Le M.D.   On: 04/23/2020 15:18   ECHOCARDIOGRAM COMPLETE  Result Date: 04/25/2020    ECHOCARDIOGRAM REPORT   Patient Name:   [name] Date of Exam: 04/24/2020 Medical Rec #:  [id number]    Height:       70.0 in Accession #:    [id number]   Weight:       304.0 lb Date of Birth:  03-22-1951     BSA:          2.494 m Patient Age:    62 years     BP:            136/65 mmHg Patient Gender: M            HR:           63 bpm. Exam Location:  [facility] Procedure: 2D Echo, Cardiac Doppler and Color Doppler Indications:     CHF-Acute Diastolic 017.79 / T90.30  History:         Patient has prior history of Echocardiogram examinations. CHF;                  Risk Factors:Hypertension.  Sonographer:     Alyse Low Roar Referring Phys:  [id number] Athena Masse Diagnosing Phys: Kathlyn Sacramento MD IMPRESSIONS  1. Left ventricular ejection fraction, by estimation, is 50 to 55%. The left ventricle has low normal function. The left ventricle has no regional wall motion abnormalities. The left ventricular internal cavity size was mildly dilated. There is mild left ventricular hypertrophy. Left ventricular diastolic parameters are consistent with Grade II diastolic dysfunction (pseudonormalization).  2. Right ventricular systolic function is normal. The right ventricular size is normal. There is mildly elevated pulmonary artery systolic pressure.  3. Left atrial size was moderately dilated.  4. Right atrial size was mildly dilated.  5. The mitral valve is normal in structure. Trivial mitral valve regurgitation. No evidence of mitral stenosis.  6. The aortic valve is abnormal. Aortic valve regurgitation is not visualized. Mild to moderate aortic valve stenosis. Aortic valve area, by VTI measures 1.36 cm. Aortic valve mean gradient measures 14.3 mmHg.  7. The inferior vena cava is dilated in size with <50% respiratory variability, suggesting right atrial pressure of 15 mmHg. FINDINGS  Left Ventricle: Left ventricular ejection fraction, by estimation, is 50 to 55%. The left ventricle has low normal function. The left ventricle has no regional wall motion abnormalities. The left ventricular internal cavity size was mildly dilated. There is mild left ventricular hypertrophy. Left ventricular diastolic parameters are consistent with Grade II diastolic dysfunction (pseudonormalization). Right Ventricle:  The right ventricular size is normal. No increase in right ventricular wall thickness. Right ventricular systolic function is normal. There is mildly elevated pulmonary artery systolic pressure. The tricuspid regurgitant velocity is 2.59  m/s, and with an assumed right atrial pressure of 15 mmHg, the estimated right ventricular systolic pressure is 76.2 mmHg. Left Atrium: Left atrial  size was moderately dilated. Right Atrium: Right atrial size was mildly dilated. Pericardium: There is no evidence of pericardial effusion. Mitral Valve: The mitral valve is normal in structure. Normal mobility of the mitral valve leaflets. Moderate mitral annular calcification. Trivial mitral valve regurgitation. No evidence of mitral valve stenosis. Tricuspid Valve: The tricuspid valve is normal in structure. Tricuspid valve regurgitation is trivial. No evidence of tricuspid stenosis. Aortic Valve: The aortic valve is abnormal. . There is mild thickening and moderate calcification of the aortic valve. Aortic valve regurgitation is not visualized. Mild to moderate aortic stenosis is present. There is mild thickening of the aortic valve. There is moderate calcification of the aortic valve. Aortic valve mean gradient measures 14.3 mmHg. Aortic valve peak gradient measures 24.1 mmHg. Aortic valve area, by VTI measures 1.36 cm. Pulmonic Valve: The pulmonic valve was normal in structure. Pulmonic valve regurgitation is not visualized. No evidence of pulmonic stenosis. Aorta: The aortic root is normal in size and structure. Venous: The inferior vena cava is dilated in size with less than 50% respiratory variability, suggesting right atrial pressure of 15 mmHg. IAS/Shunts: No atrial level shunt detected by color flow Doppler.  LEFT VENTRICLE PLAX 2D LVIDd:         5.46 cm  Diastology LVIDs:         4.09 cm  LV e' lateral:   8.27 cm/s LV PW:         1.22 cm  LV E/e' lateral: 17.9 LV IVS:        1.34 cm  LV e' medial:    8.05 cm/s LVOT diam:      1.70 cm  LV E/e' medial:  18.4 LV SV:         76 LV SV Index:   30 LVOT Area:     2.27 cm  RIGHT VENTRICLE RV Mid diam:    3.78 cm RV S prime:     13.70 cm/s TAPSE (M-mode): 2.2 cm LEFT ATRIUM              Index       RIGHT ATRIUM           Index LA diam:        4.60 cm  1.84 cm/m  RA Area:     22.10 cm LA Vol (A2C):   117.0 ml 46.92 ml/m RA Volume:   71.80 ml  28.79 ml/m LA Vol (A4C):   101.0 ml 40.50 ml/m LA Biplane Vol: 112.0 ml 44.91 ml/m  AORTIC VALVE                    PULMONIC VALVE AV Area (Vmax):    1.35 cm     PV Vmax:        1.45 m/s AV Area (Vmean):   1.39 cm     PV Peak grad:   8.4 mmHg AV Area (VTI):     1.36 cm     RVOT Peak grad: 5 mmHg AV Vmax:           245.50 cm/s AV Vmean:          178.333 cm/s AV VTI:            0.555 m AV Peak Grad:      24.1 mmHg AV Mean Grad:      14.3 mmHg LVOT Vmax:         146.00 cm/s LVOT Vmean:        109.000 cm/s LVOT  VTI:          0.333 m LVOT/AV VTI ratio: 0.60  AORTA Ao Root diam: 3.00 cm MITRAL VALVE                TRICUSPID VALVE MV Area (PHT): 3.27 cm     TR Peak grad:   26.8 mmHg MV Decel Time: 232 msec     TR Vmax:        259.00 cm/s MV E velocity: 148.00 cm/s MV A velocity: 107.00 cm/s  SHUNTS MV E/A ratio:  1.38         Systemic VTI:  0.33 m MV A Prime:    11.4 cm/s    Systemic Diam: 1.70 cm Kathlyn Sacramento MD Electronically signed by Kathlyn Sacramento MD Signature Date/Time: 04/25/2020/8:22:25 AM    Final        Subjective: Patient seen up in chair this AM.  He says he is ready to get home, but nephew is his ride and unsure when he can some.  He denies SOB, CP.  Dysuria resolved.  No other acute complaints.   Discharge Exam: Vitals:   05/03/20 0813 05/03/20 1112  BP: (!) 135/52 (!) 133/47  Pulse: 76 75  Resp: 18 18  Temp: 98.4 F (36.9 C) 97.7 F (36.5 C)  SpO2: 98% 99%   Vitals:   05/02/20 2106 05/03/20 0339 05/03/20 0813 05/03/20 1112  BP: (!) 136/51 (!) 140/48 (!) 135/52 (!) 133/47  Pulse: 81 82 76 75  Resp:   18 18  Temp:   98.4 F (36.9 C) 98.4 F (36.9 C) 97.7 F (36.5 C)  TempSrc:   Oral   SpO2: 100% 99% 98% 99%  Weight:  126.1 kg    Height:        General: Pt is alert, awake, not in acute distress Cardiovascular: RRR, S1/S2 +, no rubs, no gallops Respiratory: CTA bilaterally, no wheezing, no rhonchi Abdominal: Soft, NT, ND, bowel sounds + Extremities: improved LE edema, proximal to leg wraps bilaterally no cyanosis    The results of significant diagnostics from this hospitalization (including imaging, microbiology, ancillary and laboratory) are listed below for reference.     Microbiology: Recent Results (from the past 240 hour(s))  Blood Culture (routine x 2)     Status: None   Collection Time: 04/23/20  3:06 PM   Specimen: BLOOD  Result Value Ref Range Status   Specimen Description BLOOD RAC  Final   Special Requests   Final    BOTTLES DRAWN AEROBIC AND ANAEROBIC Blood Culture results may not be optimal due to an inadequate volume of blood received in culture bottles   Culture   Final    NO GROWTH 5 DAYS Performed at [facility], [address] [place], [place] [address]    Report Status 04/28/2020 FINAL  Final  Blood Culture (routine x 2)     Status: Abnormal   Collection Time: 04/23/20  3:06 PM   Specimen: BLOOD RIGHT FOREARM  Result Value Ref Range Status   Specimen Description   Final    BLOOD RIGHT FOREARM Performed at [facility], [place] [address], [place], [place] [address]    Special Requests   Final    BOTTLES DRAWN AEROBIC AND ANAEROBIC Blood Culture adequate volume Performed at [facility], [address] [place], [place] [address]    Culture  Setup Time   Final    GRAM NEGATIVE RODS IN BOTH AEROBIC AND ANAEROBIC BOTTLES CRITICAL  RESULT CALLED TO, READ BACK BY AND VERIFIED WITH: SCOTT HALL 04/24/20 AT 0618 HS Performed at [facility], [address] [place], [place] [address]    Culture KLEBSIELLA PNEUMONIAE (A)  Final   Report  Status 04/26/2020 FINAL  Final   Organism ID, Bacteria KLEBSIELLA PNEUMONIAE  Final      Susceptibility   Klebsiella pneumoniae - MIC*    AMPICILLIN >=32 RESISTANT Resistant     CEFAZOLIN <=4 SENSITIVE Sensitive     CEFEPIME <=0.12 SENSITIVE Sensitive     CEFTAZIDIME <=1 SENSITIVE Sensitive     CEFTRIAXONE <=0.25 SENSITIVE Sensitive     CIPROFLOXACIN <=0.25 SENSITIVE Sensitive     GENTAMICIN <=1 SENSITIVE Sensitive     IMIPENEM <=0.25 SENSITIVE Sensitive     TRIMETH/SULFA <=20 SENSITIVE Sensitive     AMPICILLIN/SULBACTAM >=32 RESISTANT Resistant     PIP/TAZO <=4 SENSITIVE Sensitive     * KLEBSIELLA PNEUMONIAE  Blood Culture ID Panel (Reflexed)     Status: Abnormal   Collection Time: 04/23/20  3:06 PM  Result Value Ref Range Status   Enterococcus species NOT DETECTED NOT DETECTED Final   Listeria monocytogenes NOT DETECTED NOT DETECTED Final   Staphylococcus species NOT DETECTED NOT DETECTED Final   Staphylococcus aureus (BCID) NOT DETECTED NOT DETECTED Final   Streptococcus species NOT DETECTED NOT DETECTED Final   Streptococcus agalactiae NOT DETECTED NOT DETECTED Final   Streptococcus pneumoniae NOT DETECTED NOT DETECTED Final   Streptococcus pyogenes NOT DETECTED NOT DETECTED Final   Acinetobacter baumannii NOT DETECTED NOT DETECTED Final   Enterobacteriaceae species DETECTED (A) NOT DETECTED Final    Comment: Enterobacteriaceae represent a large family of gram-negative bacteria, not a single organism. CRITICAL RESULT CALLED TO, READ BACK BY AND VERIFIED WITH: SCOTT HALL 04/24/20 AT 0618 HS    Enterobacter cloacae complex NOT DETECTED NOT DETECTED Final   Escherichia coli NOT DETECTED NOT DETECTED Final   Klebsiella oxytoca NOT DETECTED NOT DETECTED Final   Klebsiella pneumoniae DETECTED (A) NOT DETECTED Final    Comment: CRITICAL RESULT CALLED TO, READ BACK BY AND VERIFIED WITH: SCOTT HALL 04/24/20 AT 0618 HS    Proteus species NOT DETECTED NOT DETECTED Final   Serratia  marcescens NOT DETECTED NOT DETECTED Final   Carbapenem resistance NOT DETECTED NOT DETECTED Final   Haemophilus influenzae NOT DETECTED NOT DETECTED Final   Neisseria meningitidis NOT DETECTED NOT DETECTED Final   Pseudomonas aeruginosa NOT DETECTED NOT DETECTED Final   Candida albicans NOT DETECTED NOT DETECTED Final   Candida glabrata NOT DETECTED NOT DETECTED Final   Candida krusei NOT DETECTED NOT DETECTED Final   Candida parapsilosis NOT DETECTED NOT DETECTED Final   Candida tropicalis NOT DETECTED NOT DETECTED Final    Comment: Performed at [facility], [address] [place], [place] [address]  SARS Coronavirus 2 by RT PCR (hospital order, performed in [place] hospital lab)     Status: None   Collection Time: 04/23/20  3:46 PM  Result Value Ref Range Status   SARS Coronavirus 2 NEGATIVE NEGATIVE Final    Comment: (NOTE) SARS-CoV-2 target nucleic acids are NOT DETECTED.  The SARS-CoV-2 RNA is generally detectable in upper and lower respiratory specimens during the acute phase of infection. The lowest concentration of SARS-CoV-2 viral copies this assay can detect is 250 copies / mL. A negative result does not preclude SARS-CoV-2 infection and should not be used as the sole basis for treatment or other patient management decisions.  A negative result may occur with improper specimen collection / handling, submission of specimen other than nasopharyngeal swab, presence of viral mutation(s) within the areas targeted by this assay, and inadequate number of viral copies (<250 copies / mL). A negative result must be combined with clinical observations, patient history, and epidemiological information.  Fact Sheet for Patients:   StrictlyIdeas.no  Fact Sheet for Healthcare Providers: BankingDealers.co.za  This test is not yet approved or  cleared by the [place] FDA and has been authorized for detection and/or  diagnosis of SARS-CoV-2 by FDA under an Emergency Use Authorization (EUA).  This EUA will remain in effect (meaning this test can be used) for the duration of the COVID-19 declaration under Section 564(b)(1) of the Act, 21 U.S.C. section 360bbb-3(b)(1), unless the authorization is terminated or revoked sooner.  Performed at [facility], [address] [place], [place] [address]      Labs: BNP (last 3 results) Recent Labs    02/23/20 1403 03/10/20 2100 04/23/20 1525  BNP 213.9* 167.4* 604.5*   Basic Metabolic Panel: Recent Labs  Lab 04/28/20 0530 04/28/20 0530 04/29/20 0439 04/30/20 0443 05/01/20 0557 05/02/20 0433 05/03/20 0545  NA 137   < > 137 131* 134* 132* 134*  K 3.6   < > 3.6 3.6 4.1 4.0 4.1  CL 98   < > 98 93* 96* 93* 95*  CO2 30   < > '26 27 26 28 29  '$ GLUCOSE 182*   < > 175* 170* 154* 178* 183*  BUN 73*   < > 69* 67* 70* 70* 72*  CREATININE 2.96*   < > 2.91* 2.97* 3.43* 3.47* 3.21*  CALCIUM 8.6*   < > 8.7* 8.8* 8.6* 8.6* 9.0  MG 1.6*  --  1.7 1.7  --  1.8  --    < > = values in this interval not displayed.   Liver Function Tests: No results for input(s): AST, ALT, ALKPHOS, BILITOT, PROT, ALBUMIN in the last 168 hours. No results for input(s): LIPASE, AMYLASE in the last 168 hours. Recent Labs  Lab 05/02/20 1258  AMMONIA 28   CBC: Recent Labs  Lab 04/29/20 0439 04/29/20 1205 04/30/20 0443 04/30/20 2105 05/01/20 1033 05/02/20 0433 05/02/20 1258 05/03/20 0545 05/03/20 1031  WBC 4.7  --  4.6  --  6.4 3.9*  --  3.6*  --   HGB 7.0*   < > 6.7*   < > 7.7* 7.1* 7.4* 7.0* 7.2*  HCT 21.0*   < > 21.2*   < > 22.8* 21.2* 22.4* 20.6* 22.1*  MCV 91.7  --  93.8  --  90.5 91.0  --  90.7  --   PLT 112*  --  111*  --  103* 82*  --  85*  --    < > = values in this interval not displayed.   Cardiac Enzymes: No results for input(s): CKTOTAL, CKMB, CKMBINDEX, TROPONINI in the last 168 hours. BNP: Invalid input(s): POCBNP CBG: Recent Labs  Lab  05/02/20 1127 05/02/20 1541 05/02/20 2112 05/03/20 0801 05/03/20 1114  GLUCAP 268* 226* 165* 166*  166* 307*   D-Dimer No results for input(s): DDIMER in the last 72 hours. Hgb A1c No results for input(s): HGBA1C in the last 72 hours. Lipid Profile No results for input(s): CHOL, HDL, LDLCALC, TRIG, CHOLHDL, LDLDIRECT in the last 72 hours. Thyroid function studies No results for input(s): TSH, T4TOTAL, T3FREE, THYROIDAB in the last 72 hours.  Invalid input(s): FREET3 Anemia  work up No results for input(s): VITAMINB12, FOLATE, FERRITIN, TIBC, IRON, RETICCTPCT in the last 72 hours. Urinalysis    Component Value Date/Time   COLORURINE YELLOW (A) 05/01/2020 1330   APPEARANCEUR CLEAR (A) 05/01/2020 1330   APPEARANCEUR Clear 09/10/2014 0456   LABSPEC 1.010 05/01/2020 1330   LABSPEC 1.016 09/10/2014 0456   PHURINE 6.0 05/01/2020 1330   GLUCOSEU NEGATIVE 05/01/2020 1330   GLUCOSEU Negative 09/10/2014 0456   HGBUR SMALL (A) 05/01/2020 1330   BILIRUBINUR NEGATIVE 05/01/2020 1330   BILIRUBINUR Negative 09/10/2014 0456   KETONESUR NEGATIVE 05/01/2020 1330   PROTEINUR 100 (A) 05/01/2020 1330   NITRITE NEGATIVE 05/01/2020 1330   LEUKOCYTESUR NEGATIVE 05/01/2020 1330   LEUKOCYTESUR Negative 09/10/2014 0456   Sepsis Labs Invalid input(s): PROCALCITONIN,  WBC,  LACTICIDVEN Microbiology Recent Results (from the past 240 hour(s))  Blood Culture (routine x 2)     Status: None   Collection Time: 04/23/20  3:06 PM   Specimen: BLOOD  Result Value Ref Range Status   Specimen Description BLOOD RAC  Final   Special Requests   Final    BOTTLES DRAWN AEROBIC AND ANAEROBIC Blood Culture results may not be optimal due to an inadequate volume of blood received in culture bottles   Culture   Final    NO GROWTH 5 DAYS Performed at [facility], [address] [place], [place] [address]    Report Status 04/28/2020 FINAL  Final  Blood Culture (routine x 2)     Status: Abnormal    Collection Time: 04/23/20  3:06 PM   Specimen: BLOOD RIGHT FOREARM  Result Value Ref Range Status   Specimen Description   Final    BLOOD RIGHT FOREARM Performed at [facility], [place] [address], [place], [place] [address]    Special Requests   Final    BOTTLES DRAWN AEROBIC AND ANAEROBIC Blood Culture adequate volume Performed at [facility], [address] [place], [place] [address]    Culture  Setup Time   Final    GRAM NEGATIVE RODS IN BOTH AEROBIC AND ANAEROBIC BOTTLES CRITICAL RESULT CALLED TO, READ BACK BY AND VERIFIED WITH: SCOTT HALL 04/24/20 AT 0618 HS Performed at [facility], [address], [place], [place] [address]    Culture KLEBSIELLA PNEUMONIAE (A)  Final   Report Status 04/26/2020 FINAL  Final   Organism ID, Bacteria KLEBSIELLA PNEUMONIAE  Final      Susceptibility   Klebsiella pneumoniae - MIC*    AMPICILLIN >=32 RESISTANT Resistant     CEFAZOLIN <=4 SENSITIVE Sensitive     CEFEPIME <=0.12 SENSITIVE Sensitive     CEFTAZIDIME <=1 SENSITIVE Sensitive     CEFTRIAXONE <=0.25 SENSITIVE Sensitive     CIPROFLOXACIN <=0.25 SENSITIVE Sensitive     GENTAMICIN <=1 SENSITIVE Sensitive     IMIPENEM <=0.25 SENSITIVE Sensitive     TRIMETH/SULFA <=20 SENSITIVE Sensitive     AMPICILLIN/SULBACTAM >=32 RESISTANT Resistant     PIP/TAZO <=4 SENSITIVE Sensitive     * KLEBSIELLA PNEUMONIAE  Blood Culture ID Panel (Reflexed)     Status: Abnormal   Collection Time: 04/23/20  3:06 PM  Result Value Ref Range Status   Enterococcus species NOT DETECTED NOT DETECTED Final   Listeria monocytogenes NOT DETECTED NOT DETECTED Final   Staphylococcus species NOT DETECTED NOT DETECTED Final   Staphylococcus aureus (BCID) NOT DETECTED NOT DETECTED Final   Streptococcus species NOT DETECTED NOT DETECTED Final   Streptococcus agalactiae NOT DETECTED  NOT DETECTED Final   Streptococcus pneumoniae NOT DETECTED NOT DETECTED Final   Streptococcus pyogenes NOT DETECTED NOT  DETECTED Final   Acinetobacter baumannii NOT DETECTED NOT DETECTED Final   Enterobacteriaceae species DETECTED (A) NOT DETECTED Final    Comment: Enterobacteriaceae represent a large family of gram-negative bacteria, not a single organism. CRITICAL RESULT CALLED TO, READ BACK BY AND VERIFIED WITH: SCOTT HALL 04/24/20 AT 0618 HS    Enterobacter cloacae complex NOT DETECTED NOT DETECTED Final   Escherichia coli NOT DETECTED NOT DETECTED Final   Klebsiella oxytoca NOT DETECTED NOT DETECTED Final   Klebsiella pneumoniae DETECTED (A) NOT DETECTED Final    Comment: CRITICAL RESULT CALLED TO, READ BACK BY AND VERIFIED WITH: SCOTT HALL 04/24/20 AT 0618 HS    Proteus species NOT DETECTED NOT DETECTED Final   Serratia marcescens NOT DETECTED NOT DETECTED Final   Carbapenem resistance NOT DETECTED NOT DETECTED Final   Haemophilus influenzae NOT DETECTED NOT DETECTED Final   Neisseria meningitidis NOT DETECTED NOT DETECTED Final   Pseudomonas aeruginosa NOT DETECTED NOT DETECTED Final   Candida albicans NOT DETECTED NOT DETECTED Final   Candida glabrata NOT DETECTED NOT DETECTED Final   Candida krusei NOT DETECTED NOT DETECTED Final   Candida parapsilosis NOT DETECTED NOT DETECTED Final   Candida tropicalis NOT DETECTED NOT DETECTED Final    Comment: Performed at [facility], [address] [place], [place] [address]  SARS Coronavirus 2 by RT PCR (hospital order, performed in [facility] hospital lab)     Status: None   Collection Time: 04/23/20  3:46 PM  Result Value Ref Range Status   SARS Coronavirus 2 NEGATIVE NEGATIVE Final    Comment: (NOTE) SARS-CoV-2 target nucleic acids are NOT DETECTED.  The SARS-CoV-2 RNA is generally detectable in upper and lower respiratory specimens during the acute phase of infection. The lowest concentration of SARS-CoV-2 viral copies this assay can detect is 250 copies / mL. A negative result does not preclude SARS-CoV-2 infection and should not be  used as the sole basis for treatment or other patient management decisions.  A negative result may occur with improper specimen collection / handling, submission of specimen other than nasopharyngeal swab, presence of viral mutation(s) within the areas targeted by this assay, and inadequate number of viral copies (<250 copies / mL). A negative result must be combined with clinical observations, patient history, and epidemiological information.  Fact Sheet for Patients:   StrictlyIdeas.no  Fact Sheet for Healthcare Providers: BankingDealers.co.za  This test is not yet approved or  cleared by the [place] FDA and has been authorized for detection and/or diagnosis of SARS-CoV-2 by FDA under an Emergency Use Authorization (EUA).  This EUA will remain in effect (meaning this test can be used) for the duration of the COVID-19 declaration under Section 564(b)(1) of the Act, 21 U.S.C. section 360bbb-3(b)(1), unless the authorization is terminated or revoked sooner.  Performed at [facility], [address] [place], [place] [address]      Time coordinating discharge: Over 30 minutes  SIGNED:   Ezekiel Slocumb, DO [facility] 05/03/2020, 11:29 AM   If 7PM-7AM, please contact night-coverage [email or web address]

## 2020-05-03 NOTE — Progress Notes (Signed)
[facility], [place] 05/03/20  Subjective:   LOS: 10 08/02 0701 - 08/03 0700 In: -  Out: 900 [Urine:900] Renal function slightly improved. EGFR up to 19.   Objective:  Vital signs in last 24 hours:  Temp:  [97.7 F (36.5 C)-98.4 F (36.9 C)] 97.7 F (36.5 C) (08/03 1112) Pulse Rate:  [72-82] 75 (08/03 1112) Resp:  [18] 18 (08/03 1112) BP: (133-144)/(47-52) 133/47 (08/03 1112) SpO2:  [98 %-100 %] 99 % (08/03 1112) Weight:  [126.1 kg] 126.1 kg (08/03 0339)  Weight change: -0.454 kg Filed Weights   04/30/20 0419 05/02/20 0450 05/03/20 0339  Weight: (!) 127.3 kg (!) 126.6 kg 126.1 kg    Intake/Output:    Intake/Output Summary (Last 24 hours) at 05/03/2020 1213 Last data filed at 05/03/2020 0950 Gross per 24 hour  Intake 240 ml  Output 900 ml  Net -660 ml     Physical Exam: General:  Obese gentleman, sitting up  HEENT  moist oral mucous membranes  Pulm/lungs  normal breathing effort, decreased breath sounds at bases  CVS/Heart  S1S2 no rub  Abdomen:   Soft, obese, nontender  Extremities:  2+ pitting edema, left leg wrapped    Neurologic:  Alert, able to answer questions appropriately  Skin:  Dependent rubor RLE          Basic Metabolic Panel:  Recent Labs  Lab 04/28/20 0530 04/28/20 0530 04/29/20 0439 04/29/20 0439 04/30/20 0443 04/30/20 0443 05/01/20 0557 05/02/20 0433 05/03/20 0545  NA 137   < > 137  --  131*  --  134* 132* 134*  K 3.6   < > 3.6  --  3.6  --  4.1 4.0 4.1  CL 98   < > 98  --  93*  --  96* 93* 95*  CO2 30   < > 26  --  27  --  26 28 29  GLUCOSE 182*   < > 175*  --  170*  --  154* 178* 183*  BUN 73*   < > 69*  --  67*  --  70* 70* 72*  CREATININE 2.96*   < > 2.91*  --  2.97*  --  3.43* 3.47* 3.21*  CALCIUM 8.6*   < > 8.7*   < > 8.8*   < > 8.6* 8.6* 9.0  MG 1.6*  --  1.7  --  1.7  --   --  1.8  --    < > = values in this interval not displayed.     CBC: Recent Labs  Lab 04/29/20 0439 04/29/20 1205  04/30/20 0443 04/30/20 2105 05/01/20 1033 05/02/20 0433 05/02/20 1258 05/03/20 0545 05/03/20 1031  WBC 4.7  --  4.6  --  6.4 3.9*  --  3.6*  --   HGB 7.0*   < > 6.7*   < > 7.7* 7.1* 7.4* 7.0* 7.2*  HCT 21.0*   < > 21.2*   < > 22.8* 21.2* 22.4* 20.6* 22.1*  MCV 91.7  --  93.8  --  90.5 91.0  --  90.7  --   PLT 112*  --  111*  --  103* 82*  --  85*  --    < > = values in this interval not displayed.      Lab Results  Component Value Date   HEPBSAG NON REACTIVE 01/30/2020   HEPBSAB NON REACTIVE 01/30/2020   HEPBIGM NON REACTIVE 01/30/2020        Microbiology:  Recent Results (from the past 240 hour(s))  Blood Culture (routine x 2)     Status: None   Collection Time: 04/23/20  3:06 PM   Specimen: BLOOD  Result Value Ref Range Status   Specimen Description BLOOD RAC  Final   Special Requests   Final    BOTTLES DRAWN AEROBIC AND ANAEROBIC Blood Culture results may not be optimal due to an inadequate volume of blood received in culture bottles   Culture   Final    NO GROWTH 5 DAYS Performed at [facility], [address] [place], [place] [address]    Report Status 04/28/2020 FINAL  Final  Blood Culture (routine x 2)     Status: Abnormal   Collection Time: 04/23/20  3:06 PM   Specimen: BLOOD RIGHT FOREARM  Result Value Ref Range Status   Specimen Description   Final    BLOOD RIGHT FOREARM Performed at [facility], [place] [address] [place], [place] [address]    Special Requests   Final    BOTTLES DRAWN AEROBIC AND ANAEROBIC Blood Culture adequate volume Performed at [facility], [address] [place], [place] [address]    Culture  Setup Time   Final    GRAM NEGATIVE RODS IN BOTH AEROBIC AND ANAEROBIC BOTTLES CRITICAL RESULT CALLED TO, READ BACK BY AND VERIFIED WITH: SCOTT HALL 04/24/20 AT 0618 HS Performed at [facility], [address] [place], [place] [address]    Culture KLEBSIELLA PNEUMONIAE (A)  Final   Report Status  04/26/2020 FINAL  Final   Organism ID, Bacteria KLEBSIELLA PNEUMONIAE  Final      Susceptibility   Klebsiella pneumoniae - MIC*    AMPICILLIN >=32 RESISTANT Resistant     CEFAZOLIN <=4 SENSITIVE Sensitive     CEFEPIME <=0.12 SENSITIVE Sensitive     CEFTAZIDIME <=1 SENSITIVE Sensitive     CEFTRIAXONE <=0.25 SENSITIVE Sensitive     CIPROFLOXACIN <=0.25 SENSITIVE Sensitive     GENTAMICIN <=1 SENSITIVE Sensitive     IMIPENEM <=0.25 SENSITIVE Sensitive     TRIMETH/SULFA <=20 SENSITIVE Sensitive     AMPICILLIN/SULBACTAM >=32 RESISTANT Resistant     PIP/TAZO <=4 SENSITIVE Sensitive     * KLEBSIELLA PNEUMONIAE  Blood Culture ID Panel (Reflexed)     Status: Abnormal   Collection Time: 04/23/20  3:06 PM  Result Value Ref Range Status   Enterococcus species NOT DETECTED NOT DETECTED Final   Listeria monocytogenes NOT DETECTED NOT DETECTED Final   Staphylococcus species NOT DETECTED NOT DETECTED Final   Staphylococcus aureus (BCID) NOT DETECTED NOT DETECTED Final   Streptococcus species NOT DETECTED NOT DETECTED Final   Streptococcus agalactiae NOT DETECTED NOT DETECTED Final   Streptococcus pneumoniae NOT DETECTED NOT DETECTED Final   Streptococcus pyogenes NOT DETECTED NOT DETECTED Final   Acinetobacter baumannii NOT DETECTED NOT DETECTED Final   Enterobacteriaceae species DETECTED (A) NOT DETECTED Final    Comment: Enterobacteriaceae represent a large family of gram-negative bacteria, not a single organism. CRITICAL RESULT CALLED TO, READ BACK BY AND VERIFIED WITH: SCOTT HALL 04/24/20 AT 0618 HS    Enterobacter cloacae complex NOT DETECTED NOT DETECTED Final   Escherichia coli NOT DETECTED NOT DETECTED Final   Klebsiella oxytoca NOT DETECTED NOT DETECTED Final   Klebsiella pneumoniae DETECTED (A) NOT DETECTED Final    Comment: CRITICAL RESULT CALLED TO, READ BACK BY AND VERIFIED WITH: SCOTT HALL 04/24/20 AT 0618 HS    Proteus species  NOT DETECTED NOT DETECTED Final   Serratia marcescens  NOT DETECTED NOT DETECTED Final   Carbapenem resistance NOT DETECTED NOT DETECTED Final   Haemophilus influenzae NOT DETECTED NOT DETECTED Final   Neisseria meningitidis NOT DETECTED NOT DETECTED Final   Pseudomonas aeruginosa NOT DETECTED NOT DETECTED Final   Candida albicans NOT DETECTED NOT DETECTED Final   Candida glabrata NOT DETECTED NOT DETECTED Final   Candida krusei NOT DETECTED NOT DETECTED Final   Candida parapsilosis NOT DETECTED NOT DETECTED Final   Candida tropicalis NOT DETECTED NOT DETECTED Final    Comment: Performed at [facility], [address] [place], [place] [address]  SARS Coronavirus 2 by RT PCR (hospital order, performed in [facility] hospital lab)     Status: None   Collection Time: 04/23/20  3:46 PM  Result Value Ref Range Status   SARS Coronavirus 2 NEGATIVE NEGATIVE Final    Comment: (NOTE) SARS-CoV-2 target nucleic acids are NOT DETECTED.  The SARS-CoV-2 RNA is generally detectable in upper and lower respiratory specimens during the acute phase of infection. The lowest concentration of SARS-CoV-2 viral copies this assay can detect is 250 copies / mL. A negative result does not preclude SARS-CoV-2 infection and should not be used as the sole basis for treatment or other patient management decisions.  A negative result may occur with improper specimen collection / handling, submission of specimen other than nasopharyngeal swab, presence of viral mutation(s) within the areas targeted by this assay, and inadequate number of viral copies (<250 copies / mL). A negative result must be combined with clinical observations, patient history, and epidemiological information.  Fact Sheet for Patients:   https://www.fda.gov/media/136312/download  Fact Sheet for Healthcare Providers: https://www.fda.gov/media/136313/download  This test is not yet approved or  cleared by the [place] FDA and has been authorized for detection and/or diagnosis of  SARS-CoV-2 by FDA under an Emergency Use Authorization (EUA).  This EUA will remain in effect (meaning this test can be used) for the duration of the COVID-19 declaration under Section 564(b)(1) of the Act, 21 U.S.C. section 360bbb-3(b)(1), unless the authorization is terminated or revoked sooner.  Performed at [facility], [address] [place], [place] [address]     Coagulation Studies: No results for input(s): LABPROT, INR in the last 72 hours.  Urinalysis: Recent Labs    05/01/20 1330  COLORURINE YELLOW*  LABSPEC 1.010  PHURINE 6.0  GLUCOSEU NEGATIVE  HGBUR SMALL*  BILIRUBINUR NEGATIVE  KETONESUR NEGATIVE  PROTEINUR 100*  NITRITE NEGATIVE  LEUKOCYTESUR NEGATIVE      Imaging: No results found.   Medications:   . sodium chloride 250 mL (05/02/20 0532)   . sodium chloride   Intravenous Once  . atorvastatin  20 mg Oral Daily  . darbepoetin (ARANESP) injection - NON-DIALYSIS  100 mcg Subcutaneous Q Tue-1800  . ferrous sulfate  325 mg Oral Daily  . hydrALAZINE  50 mg Oral BID  . insulin aspart  0-20 Units Subcutaneous TID WC  . insulin aspart  0-5 Units Subcutaneous QHS  . lactulose  60 g Oral Daily  . pantoprazole  40 mg Oral BID  . potassium chloride  40 mEq Oral Daily  . rifaximin  550 mg Oral BID  . sodium chloride flush  3 mL Intravenous Q12H  . tamsulosin  0.4 mg Oral QPC supper  . torsemide  100 mg Oral Daily   sodium chloride, acetaminophen, benzonatate, ipratropium-albuterol, LORazepam, ondansetron (ZOFRAN) IV, oxyCODONE, phenol, sodium chloride, sodium chloride   flush, traMADol  Assessment/ Plan:  69 y.o. male with   Principal Problem:   Sepsis ([place]) Active Problems:   Cirrhosis ([place])   Esophageal varices (HCC)   Iron deficiency anemia   Chronic venous stasis dermatitis of both lower extremities   Obesity, Class III, BMI 40-49.9 (morbid obesity) ([place])   Type 2 diabetes mellitus with diabetic nephropathy, with long-term current  use of insulin (HCC)   Acute on chronic diastolic CHF (congestive heart failure) (HCC)   CKD (chronic kidney disease) stage 4, GFR 15-29 ml/min (HCC)   Acute hypoxemic respiratory failure (HCC)   Paroxysmal atrial fibrillation (HCC)   COPD with chronic bronchitis (HCC)   Bacteremia due to Klebsiella pneumoniae   Palliative care by specialist   DNR (do not resuscitate) discussion   #.  Acute kidney injury on CKD st 4 Recent Labs    04/30/20 0443 05/01/20 0557 05/02/20 0433 05/03/20 0545  CREATININE 2.97* 3.43* 3.47* 3.21*  AKI is likely secondary to abnormal cardiorenal hemodynamics in setting of liver cirrhosis.  Underlying CKD likely secondary to diabetes. CT abdomen without contrast on July 24 confirmed cirrhosis, portal venous hypertension, cholelithiasis, aortic atherosclerosis, three-vessel coronary disease, aortic valve calcifications, no hydronephrosis -EGFR up slightly to 19.  Therefore no urgent indication for dialysis.  Continue to monitor renal parameters daily.   #. Anemia of CKD  Lab Results  Component Value Date   HGB 7.2 (L) 05/03/2020  Discussed ESA therapy with patient.  Patient has been evaluated by Dr. Grayland Ormond as outpatient who recommended IV iron and ESA therapy.  He has also received blood transfusion as outpatient -Hemoglobin 7.2 at last check.  Consider blood transfusion for hemoglobin of 7 or less.  Otherwise maintain NS.  #. Diabetes type 2 with CKD Hemoglobin A1C (%)  Date Value  05/10/2014 11.0 (H)   Hgb A1c MFr Bld (%)  Date Value  02/23/2020 6.9 (H)  Glycemic control per hospitalist  #Cirrhosis with volume overload Continue torsemide.  #Hypokalemia Continue potassium chloride.    LOS: 10 Corey [place] 8/3/202112:13 PM  [place], [place]

## 2020-05-03 NOTE — Progress Notes (Signed)
[name] to be D/C'd Home per MD order.  Discussed prescriptions and follow up appointments with the patient. Prescriptions electronically submitted medication list explained in detail. Pt verbalized understanding.  Allergies as of 05/03/2020   No Known Allergies     Medication List    STOP taking these medications   amLODipine 5 MG tablet Commonly known as: NORVASC   furosemide 40 MG tablet Commonly known as: LASIX   losartan 25 MG tablet Commonly known as: COZAAR   metolazone 5 MG tablet Commonly known as: ZAROXOLYN   spironolactone 25 MG tablet Commonly known as: ALDACTONE     TAKE these medications   atorvastatin 20 MG tablet Commonly known as: LIPITOR Take 20 mg by mouth daily.   Combivent Respimat 20-100 MCG/ACT Aers respimat Generic drug: Ipratropium-Albuterol Inhale 1 puff into the lungs every 6 (six) hours.   ferrous sulfate 325 (65 FE) MG tablet Take 1 tablet (325 mg total) by mouth daily. Start taking on: May 04, 2020 What changed:   medication strength  when to take this   hydrALAZINE 50 MG tablet Commonly known as: APRESOLINE Take 1 tablet (50 mg total) by mouth 2 (two) times daily. What changed:   medication strength  how much to take  when to take this   lactulose 10 GM/15ML solution Commonly known as: CHRONULAC Take 90 mLs (60 g total) by mouth daily. Start taking on: May 04, 2020 What changed:   how much to take  when to take this   LORazepam 0.5 MG tablet Commonly known as: ATIVAN Take 1 tablet (0.5 mg total) by mouth 3 (three) times daily as needed for Anxiety   multivitamin tablet Take 1 tablet by mouth daily.   NovoLOG Mix 70/30 FlexPen (70-30) 100 UNIT/ML FlexPen Generic drug: insulin aspart protamine - aspart as directed. 0-84 Units Subcutaneous 2 times Daily before meals   pantoprazole 40 MG tablet Commonly known as: Protonix Take 1 tablet (40 mg total) by mouth 2 (two) times daily.   potassium chloride 10  MEQ tablet Commonly known as: KLOR-CON Take 10 mEq by mouth daily.   rifaximin 550 MG Tabs tablet Commonly known as: XIFAXAN Take 1 tablet (550 mg total) by mouth 2 (two) times daily.   sodium chloride 0.65 % Soln nasal spray Commonly known as: OCEAN Place 1 spray into both nostrils as needed for congestion (nose irritation).   tamsulosin 0.4 MG Caps capsule Commonly known as: FLOMAX Take 1 capsule (0.4 mg total) by mouth daily after supper.   torsemide 100 MG tablet Commonly known as: DEMADEX Take 1 tablet (100 mg total) by mouth daily. Start taking on: May 04, 2020   traMADol 50 MG tablet Commonly known as: ULTRAM Take 1 tablet (50 mg total) by mouth every 8 (eight) hours as needed for moderate pain.            Discharge Care Instructions  (From admission, onward)         Start     Ordered   05/03/20 0000  Discharge wound care:       Comments: Remove dressings and wash legs with soap and water, rinse and pat dry.  Dry between toes.  Cover wounds with double-folded layer of xerofoam gauze.  Top with ABD pad and secure with Kerlix roll gauze from just below toes to just below knees. Top with ACE wraps.  Change leg wraps on Mondays and Fridays.   05/03/20 1128  Vitals:   05/03/20 0813 05/03/20 1112  BP: (!) 135/52 (!) 133/47  Pulse: 76 75  Resp: 18 18  Temp: 98.4 F (36.9 C) 97.7 F (36.5 C)  SpO2: 98% 99%    Skin clean, dry and intact without evidence of skin break down, no evidence of skin tears noted. IV catheter discontinued intact. Site without signs and symptoms of complications. Dressing and pressure applied. Pt denies pain at this time. No complaints noted.  An After Visit Summary was printed and given to the patient. Patient waiting on ride to be discharged home  [place]

## 2020-05-03 NOTE — Progress Notes (Signed)
Inpatient Diabetes Program Recommendations  AACE/ADA: New Consensus Statement on Inpatient Glycemic Control (2015)  Target Ranges:  Prepandial:   less than 140 mg/dL      Peak postprandial:   less than 180 mg/dL (1-2 hours)      Critically ill patients:  140 - 180 mg/dL   Lab Results  Component Value Date   GLUCAP 166 (H) 05/03/2020   HGBA1C 6.9 (H) 02/23/2020    Review of Glycemic Control Results for KENDEL, BESSEY (MRN [id number]) as of 05/03/2020 10:46  Ref. Range 05/02/2020 07:41 05/02/2020 11:27 05/02/2020 15:41 05/02/2020 21:12 05/03/2020 08:01  Glucose-Capillary Latest Ref Range: 70 - 99 mg/dL 169 (H) 268 (H) 226 (H) 165 (H) 166 (H)   Diabetes history: DM 2 Outpatient Diabetes medications: 70/30 0-84 units bid SSI Current orders for Inpatient glycemic control:  Novolog 0-20 units tid + hs scale  Inpatient Diabetes Program Recommendations:    Postprandial glucose trends increase  -consider Novolog 3 units tid meal coverage with parameters if pt eats at least 50% of meals and glucose is at least 80 mg/dl.  Thanks,  Tama Headings RN, MSN, BC-ADM Inpatient Diabetes Coordinator Team Pager [phone] (8a-5p)

## 2020-05-03 NOTE — Progress Notes (Signed)
Progress Note  Patient Name: Corey West Date of Encounter: 05/03/2020  [facility] Cardiologist: Nelva Bush, MD   Subjective   Leg edema much improved.  No chest pain, shortness of breath, or palpitations.  Corey West is eager for discharge.  Inpatient Medications    Scheduled Meds:  sodium chloride   Intravenous Once   atorvastatin  20 mg Oral Daily   darbepoetin (ARANESP) injection - NON-DIALYSIS  100 mcg Subcutaneous Q Tue-1800   ferrous sulfate  325 mg Oral Daily   hydrALAZINE  50 mg Oral BID   insulin aspart  0-20 Units Subcutaneous TID WC   insulin aspart  0-5 Units Subcutaneous QHS   lactulose  60 g Oral Daily   pantoprazole  40 mg Oral BID   potassium chloride  40 mEq Oral Daily   rifaximin  550 mg Oral BID   sodium chloride flush  3 mL Intravenous Q12H   tamsulosin  0.4 mg Oral QPC supper   torsemide  100 mg Oral Daily   Continuous Infusions:  sodium chloride 250 mL (05/02/20 0532)   PRN Meds: sodium chloride, acetaminophen, benzonatate, ipratropium-albuterol, LORazepam, ondansetron (ZOFRAN) IV, oxyCODONE, phenol, sodium chloride, sodium chloride flush, traMADol   Vital Signs    Vitals:   05/02/20 2104 05/02/20 2106 05/03/20 0339 05/03/20 0813  BP: (!) 144/48 (!) 136/51 (!) 140/48 (!) 135/52  Pulse: 80 81 82 76  Resp:    18  Temp: 97.7 F (36.5 C)  98.4 F (36.9 C) 98.4 F (36.9 C)  TempSrc:    Oral  SpO2: 99% 100% 99% 98%  Weight:   126.1 kg   Height:        Intake/Output Summary (Last 24 hours) at 05/03/2020 1105 Last data filed at 05/03/2020 0950 Gross per 24 hour  Intake 240 ml  Output 900 ml  Net -660 ml   Last 3 Weights 05/03/2020 05/02/2020 04/30/2020  Weight (lbs) 278 lb 279 lb 280 lb 9.6 oz  Weight (kg) 126.1 kg 126.554 kg 127.279 kg  Some encounter information is confidential and restricted. Go to Review Flowsheets activity to see all data.      Telemetry    Normal sinus rhythm - Personally Reviewed  ECG    No  new tracing.  Physical Exam   GEN: No acute distress.   Neck: No JVD Cardiac: RRR with 1/6 systolic murmur.  No rubs or gallops. Respiratory: Clear to auscultation bilaterally. GI: Soft, nontender, non-distended  MS:  Both calves are wrapped with an Ace bandage, with trace to 1+ underlying pretibial edema. Neuro:  Nonfocal  Psych: Normal affect   Labs    High Sensitivity Troponin:   Recent Labs  Lab 04/23/20 1525 04/23/20 1854 04/30/20 1448 04/30/20 2105  TROPONINIHS 18* 578* 46* 44*      Chemistry Recent Labs  Lab 05/01/20 0557 05/02/20 0433 05/03/20 0545  NA 134* 132* 134*  K 4.1 4.0 4.1  CL 96* 93* 95*  CO2 26 28 29   GLUCOSE 154* 178* 183*  BUN 70* 70* 72*  CREATININE 3.43* 3.47* 3.21*  CALCIUM 8.6* 8.6* 9.0  GFRNONAA 17* 17* 19*  GFRAA 20* 20* 22*  ANIONGAP 12 11 10      Hematology Recent Labs  Lab 05/01/20 1033 05/01/20 1033 05/02/20 0433 05/02/20 0433 05/02/20 1258 05/03/20 0545 05/03/20 1031  WBC 6.4  --  3.9*  --   --  3.6*  --   RBC 2.52*  --  2.33*  --   --  2.27*  --   HGB 7.7*   < > 7.1*   < > 7.4* 7.0* 7.2*  HCT 22.8*   < > 21.2*   < > 22.4* 20.6* 22.1*  MCV 90.5  --  91.0  --   --  90.7  --   MCH 30.6  --  30.5  --   --  30.8  --   MCHC 33.8  --  33.5  --   --  34.0  --   RDW 16.5*  --  15.9*  --   --  15.5  --   PLT 103*  --  82*  --   --  85*  --    < > = values in this interval not displayed.    BNPNo results for input(s): BNP, PROBNP in the last 168 hours.   DDimer No results for input(s): DDIMER in the last 168 hours.   Radiology    No results found.  Cardiac Studies   Echocardiogram (04/24/2020): 1. Left ventricular ejection fraction, by estimation, is 50 to 55%. The  left ventricle has low normal function. The left ventricle has no regional  wall motion abnormalities. The left ventricular internal cavity size was  mildly dilated. There is mild  left ventricular hypertrophy. Left ventricular diastolic parameters are    consistent with Grade II diastolic dysfunction (pseudonormalization).  2. Right ventricular systolic function is normal. The right ventricular  size is normal. There is mildly elevated pulmonary artery systolic  pressure.  3. Left atrial size was moderately dilated.  4. Right atrial size was mildly dilated.  5. The mitral valve is normal in structure. Trivial mitral valve  regurgitation. No evidence of mitral stenosis.  6. The aortic valve is abnormal. Aortic valve regurgitation is not  visualized. Mild to moderate aortic valve stenosis. Aortic valve area, by  VTI measures 1.36 cm. Aortic valve mean gradient measures 14.3 mmHg.  7. The inferior vena cava is dilated in size with <50% respiratory  variability, suggesting right atrial pressure of 15 mmHg.   Patient Profile     69 y.o. 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  Assessment & Plan    Acute on chronic HFpEF: Lines if exam is difficult due to underlying body habitus, though [name] is only mild edema notable today.  His weight is down to 278 from a few ago 305 on 04/23/2020.  BUN and creatinine are relatively stable following transition from IV furosemide to oral torsemide.  Continue torsemide 100 mg daily.  I encouraged Corey West to minimize his sodium intake as well as restrict his fluid to less than 2 L/day.  Defer restarting spironolactone at this time in the setting of advanced CKD.  Cirrhosis: This is likely contributing to fluid retention.  Continue torsemide 100 mg daily.  Defer restarting spironolactone in the setting of advanced renal insufficiency.  Persistent atrial fibrillation: Telemetry shows maintenance of sinus  rhythm.  Defer anticoagulation given chronic anemia and history of variceal bleeding.  Chronic kidney disease stage IV: Stable renal function.  Continue torsemide 100 mg daily.  Defer reinitiation of spironolactone and losartan in the setting of advanced CKD.  Avoid nephrotoxic drugs.  Hypertension: Blood pressure somewhat labile with low diastolic readings.  Given the lack of symptoms, I think it  is reasonable to continue current regimen.  Continue hydralazine 50 mg p.o. twice daily.  Defer restarting losartan and spironolactone in the setting of CKD.  Chronic anemia:  Per primary team, with continued outpatient follow-up with hematology and GI.  [facility] will sign off.   Medication Recommendations: Continue torsemide 100 mg daily and hydralazine 50 mg twice daily. Other recommendations (labs, testing, etc): BMP Patient is seen for follow-up in our office on 05/06/2020. Follow up as an outpatient: Follow-up with Laurann Montana, NP, in our office on 05/06/2020.  For questions or updates, please contact [place] Please consult [email or web address] for contact info under [facility].  Signed, Nelva Bush, MD  05/03/2020, 11:05 AM

## 2020-05-04 ENCOUNTER — Telehealth: Payer: Self-pay | Admitting: Family

## 2020-05-04 ENCOUNTER — Ambulatory Visit: Payer: Medicare Other | Admitting: Family

## 2020-05-04 NOTE — Telephone Encounter (Signed)
Spoke to patient who was with a home health nurse and said he is doing well since discharge from hospital with no major complaints. He confirmed his f/u CHF Clinic appointment for 8/20 at [facility], [place]

## 2020-05-05 ENCOUNTER — Telehealth (HOSPITAL_COMMUNITY): Payer: Self-pay

## 2020-05-05 NOTE — Telephone Encounter (Signed)
Attempted to contact to set up home visit.  No answer left message.  Will continue to try to contact to visit for Heart Failure.    [place] [phone]

## 2020-05-06 ENCOUNTER — Inpatient Hospital Stay: Payer: Medicare Other

## 2020-05-06 ENCOUNTER — Inpatient Hospital Stay: Payer: Medicare Other | Attending: Oncology | Admitting: Oncology

## 2020-05-06 ENCOUNTER — Other Ambulatory Visit: Payer: Self-pay

## 2020-05-06 ENCOUNTER — Ambulatory Visit: Payer: Medicare Other | Admitting: Family

## 2020-05-06 VITALS — BP 139/48 | HR 86 | Temp 98.6°F | Wt 281.3 lb

## 2020-05-06 VITALS — BP 134/48 | HR 74

## 2020-05-06 DIAGNOSIS — N189 Chronic kidney disease, unspecified: Secondary | ICD-10-CM | POA: Insufficient documentation

## 2020-05-06 DIAGNOSIS — D649 Anemia, unspecified: Secondary | ICD-10-CM | POA: Diagnosis not present

## 2020-05-06 DIAGNOSIS — D500 Iron deficiency anemia secondary to blood loss (chronic): Secondary | ICD-10-CM

## 2020-05-06 LAB — CBC WITH DIFFERENTIAL/PLATELET
Abs Immature Granulocytes: 0.02 10*3/uL (ref 0.00–0.07)
Basophils Absolute: 0 10*3/uL (ref 0.0–0.1)
Basophils Relative: 1 %
Eosinophils Absolute: 0 10*3/uL (ref 0.0–0.5)
Eosinophils Relative: 0 %
HCT: 19.5 % — ABNORMAL LOW (ref 39.0–52.0)
Hemoglobin: 6.5 g/dL — ABNORMAL LOW (ref 13.0–17.0)
Immature Granulocytes: 0 %
Lymphocytes Relative: 10 %
Lymphs Abs: 0.5 10*3/uL — ABNORMAL LOW (ref 0.7–4.0)
MCH: 31.1 pg (ref 26.0–34.0)
MCHC: 33.3 g/dL (ref 30.0–36.0)
MCV: 93.3 fL (ref 80.0–100.0)
Monocytes Absolute: 0.4 10*3/uL (ref 0.1–1.0)
Monocytes Relative: 8 %
Neutro Abs: 3.8 10*3/uL (ref 1.7–7.7)
Neutrophils Relative %: 81 %
Platelets: 101 10*3/uL — ABNORMAL LOW (ref 150–400)
RBC: 2.09 MIL/uL — ABNORMAL LOW (ref 4.22–5.81)
RDW: 15.6 % — ABNORMAL HIGH (ref 11.5–15.5)
WBC: 4.8 10*3/uL (ref 4.0–10.5)
nRBC: 0 % (ref 0.0–0.2)

## 2020-05-06 LAB — IRON AND TIBC
Iron: 91 ug/dL (ref 45–182)
Saturation Ratios: 29 % (ref 17.9–39.5)
TIBC: 318 ug/dL (ref 250–450)
UIBC: 227 ug/dL

## 2020-05-06 MED ORDER — SODIUM CHLORIDE 0.9 % IV SOLN
INTRAVENOUS | Status: DC
  Filled 2020-05-06: qty 250

## 2020-05-06 MED ORDER — IRON SUCROSE 20 MG/ML IV SOLN
200.0000 mg | Freq: Once | INTRAVENOUS | Status: AC
  Administered 2020-05-06: 200 mg via INTRAVENOUS
  Filled 2020-05-06: qty 10

## 2020-05-06 MED ORDER — SODIUM CHLORIDE 0.9 % IV SOLN: 200.0000 mg | Freq: Once | INTRAVENOUS | Status: DC

## 2020-05-07 LAB — SAMPLE TO BLOOD BANK

## 2020-05-09 ENCOUNTER — Other Ambulatory Visit: Payer: Self-pay | Admitting: *Deleted

## 2020-05-09 ENCOUNTER — Other Ambulatory Visit: Payer: Self-pay | Admitting: Oncology

## 2020-05-09 ENCOUNTER — Inpatient Hospital Stay: Payer: Medicare Other

## 2020-05-09 ENCOUNTER — Other Ambulatory Visit: Payer: Self-pay

## 2020-05-09 DIAGNOSIS — D500 Iron deficiency anemia secondary to blood loss (chronic): Secondary | ICD-10-CM | POA: Diagnosis not present

## 2020-05-09 LAB — CBC WITH DIFFERENTIAL/PLATELET
Abs Immature Granulocytes: 0.01 10*3/uL (ref 0.00–0.07)
Basophils Absolute: 0 10*3/uL (ref 0.0–0.1)
Basophils Relative: 1 %
Eosinophils Absolute: 0 10*3/uL (ref 0.0–0.5)
Eosinophils Relative: 0 %
HCT: 21 % — ABNORMAL LOW (ref 39.0–52.0)
Hemoglobin: 6.7 g/dL — ABNORMAL LOW (ref 13.0–17.0)
Immature Granulocytes: 0 %
Lymphocytes Relative: 12 %
Lymphs Abs: 0.5 10*3/uL — ABNORMAL LOW (ref 0.7–4.0)
MCH: 30.6 pg (ref 26.0–34.0)
MCHC: 31.9 g/dL (ref 30.0–36.0)
MCV: 95.9 fL (ref 80.0–100.0)
Monocytes Absolute: 0.3 10*3/uL (ref 0.1–1.0)
Monocytes Relative: 7 %
Neutro Abs: 3.4 10*3/uL (ref 1.7–7.7)
Neutrophils Relative %: 80 %
Platelets: 102 10*3/uL — ABNORMAL LOW (ref 150–400)
RBC: 2.19 MIL/uL — ABNORMAL LOW (ref 4.22–5.81)
RDW: 16.4 % — ABNORMAL HIGH (ref 11.5–15.5)
WBC: 4.3 10*3/uL (ref 4.0–10.5)
nRBC: 0 % (ref 0.0–0.2)

## 2020-05-09 LAB — IRON AND TIBC
Iron: 89 ug/dL (ref 45–182)
Saturation Ratios: 27 % (ref 17.9–39.5)
TIBC: 333 ug/dL (ref 250–450)
UIBC: 244 ug/dL

## 2020-05-10 ENCOUNTER — Other Ambulatory Visit: Payer: Medicare Other

## 2020-05-10 ENCOUNTER — Inpatient Hospital Stay: Payer: Medicare Other

## 2020-05-10 DIAGNOSIS — D500 Iron deficiency anemia secondary to blood loss (chronic): Secondary | ICD-10-CM

## 2020-05-10 LAB — PREPARE RBC (CROSSMATCH)

## 2020-05-10 MED ORDER — ACETAMINOPHEN 325 MG PO TABS
650.0000 mg | ORAL_TABLET | Freq: Once | ORAL | Status: AC
  Administered 2020-05-10: 650 mg via ORAL
  Filled 2020-05-10: qty 2

## 2020-05-10 MED ORDER — SODIUM CHLORIDE 0.9% IV SOLUTION
250.0000 mL | Freq: Once | INTRAVENOUS | Status: AC
  Administered 2020-05-10: 250 mL via INTRAVENOUS
  Filled 2020-05-10: qty 250

## 2020-05-10 MED ORDER — DIPHENHYDRAMINE HCL 50 MG/ML IJ SOLN
25.0000 mg | Freq: Once | INTRAMUSCULAR | Status: AC
  Administered 2020-05-10: 25 mg via INTRAVENOUS
  Filled 2020-05-10: qty 1

## 2020-05-10 NOTE — Progress Notes (Signed)
Pt expressed a concern about "receiving 2 units of blood today due to his kidney function." MD made aware. Per MD to continue with just one unit today. Blood bank notified. Pt updated and all questions answered at this time. Blood transfusion ended at 1145, pt tolerated transfusion well with no signs of complications. Pt educated on the importance of notifying the clinic if any complications occur at home. Pt verbalized understanding. Pt stable for discharge.   Corey West [employer]

## 2020-05-11 ENCOUNTER — Other Ambulatory Visit: Payer: Medicare Other

## 2020-05-11 ENCOUNTER — Ambulatory Visit: Payer: Medicare Other

## 2020-05-11 LAB — TYPE AND SCREEN
ABO/RH(D): A NEG
Antibody Screen: NEGATIVE
Unit division: 0
Unit division: 0

## 2020-05-11 LAB — BPAM RBC
Blood Product Expiration Date: 202108222359
Blood Product Expiration Date: 202108232359
ISSUE DATE / TIME: 202108100945
Unit Type and Rh: 600
Unit Type and Rh: 600

## 2020-05-16 ENCOUNTER — Ambulatory Visit: Payer: Medicare Other | Admitting: Physician Assistant

## 2020-05-16 NOTE — Progress Notes (Deleted)
Office Visit    Patient Name: Corey West Date of Encounter: 05/16/2020  Primary Care Provider:  Leonel Ramsay, MD Primary Cardiologist:  Nelva Bush, MD  Chief Complaint    No chief complaint on file.  69 y.o. 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  Past Medical History    Past Medical History:  Diagnosis Date  . Anemia   . Anxiety    controlled;   . Arthritis   . AVM (arteriovenous malformation) of stomach, acquired with hemorrhage   . Barrett's esophagus   . CHF (congestive heart failure) ([place])   . Chronic kidney disease    renal infufficiency  . Cirrhosis ([place])   . Depression    controlled;   Marland Kitchen Diabetes mellitus without complication ([place])    not controlled, taking insulin but sugar continues to run high;   . Edema   . Esophageal varices ([place])   . GAVE (gastric antral vascular ectasia)   . GERD (gastroesophageal reflux disease)   . History of hiatal hernia   . Hyperlipidemia   . Hypertension    controlled well;   . Liver cirrhosis ([place])   . Nephropathy, diabetic ([place])   . Obesity   . Pancytopenia ([place])   . Polyp, stomach    with chronic blood loss  . Sleep apnea    does not wear a cpap, Medicare would not pay for it   . Venous stasis dermatitis of both lower extremities   . Venous stasis of both lower extremities    with cellulitis   Past Surgical History:  Procedure Laterality Date  . ESOPHAGOGASTRODUODENOSCOPY N/A 02/09/2015   Procedure: ESOPHAGOGASTRODUODENOSCOPY (EGD);  Surgeon: Manya Silvas, MD;  [facility] END[facility];  Service: Endoscopy;  Laterality: N/A;  . ESOPHAGOGASTRODUODENOSCOPY N/A 07/22/2015   Procedure:  ESOPHAGOGASTRODUODENOSCOPY (EGD);  Surgeon: Manya Silvas, MD;  Location[facility] ENDOSCOPY;  Service: Endoscopy;  Laterality: N/A;  . ESOPHAGOGASTRODUODENOSCOPY N/A 06/26/2017   Procedure: ESOPHAGOGASTRODUODENOSCOPY (EGD);  Surgeon: Manya Silvas, MD;  Location: [facility] ENDOSCOPY;  Service: Endoscopy;  Laterality: N/A;  . ESOPHAGOGASTRODUODENOSCOPY N/A 06/27/2017   Procedure: ESOPHAGOGASTRODUODENOSCOPY (EGD);  Surgeon: Manya Silvas, MD;  Location: [facility] ENDOSCOPY;  Service: Endoscopy;  Laterality: N/A;  . ESOPHAGOGASTRODUODENOSCOPY N/A 06/28/2017   Procedure: ESOPHAGOGASTRODUODENOSCOPY (EGD);  Surgeon: Manya Silvas, MD;  Location: [facility] ENDOSCOPY;  Service: Endoscopy;  Laterality: N/A;  . ESOPHAGOGASTRODUODENOSCOPY Bilateral 10/11/2017   Procedure: ESOPHAGOGASTRODUODENOSCOPY (EGD);  Surgeon: Manya Silvas, MD;  Location: [facility] ENDOSCOPY;  Service: Endoscopy;  Laterality: Bilateral;  . ESOPHAGOGASTRODUODENOSCOPY N/A 12/04/2017   Procedure: ESOPHAGOGASTRODUODENOSCOPY (EGD);  Surgeon: Manya Silvas, MD;  Location: [facility] ENDOSCOPY;  Service: Endoscopy;  Laterality: N/A;  . ESOPHAGOGASTRODUODENOSCOPY N/A 03/06/2019   Procedure: ESOPHAGOGASTRODUODENOSCOPY (EGD);  Surgeon: Toledo, Benay Pike, MD;  Location: [facility] ENDOSCOPY;  Service: Gastroenterology;  Laterality: N/A;  . ESOPHAGOGASTRODUODENOSCOPY (EGD) WITH PROPOFOL N/A 07/20/2015   Procedure: ESOPHAGOGASTRODUODENOSCOPY (EGD) WITH PROPOFOL;  Surgeon: Manya Silvas, MD;  Location: [facility] ENDOSCOPY;  Service: Endoscopy;  Laterality: N/A;  . ESOPHAGOGASTRODUODENOSCOPY (EGD) WITH PROPOFOL N/A 09/16/2015   Procedure: ESOPHAGOGASTRODUODENOSCOPY (EGD) WITH PROPOFOL;  Surgeon: Manya Silvas, MD;  Location: [facility] ENDOSCOPY;  Service: Endoscopy;  Laterality: N/A;  . ESOPHAGOGASTRODUODENOSCOPY (EGD) WITH PROPOFOL N/A 03/16/2016  Procedure: ESOPHAGOGASTRODUODENOSCOPY (EGD) WITH PROPOFOL;  Surgeon: Manya Silvas, MD;  Location: [facility] ENDOSCOPY;  Service:  Endoscopy;  Laterality: N/A;  . ESOPHAGOGASTRODUODENOSCOPY (EGD) WITH PROPOFOL N/A 09/14/2016   Procedure: ESOPHAGOGASTRODUODENOSCOPY (EGD) WITH PROPOFOL;  Surgeon: Manya Silvas, MD;  Location: [facility] ENDOSCOPY;  Service: Endoscopy;  Laterality: N/A;  . ESOPHAGOGASTRODUODENOSCOPY (EGD) WITH PROPOFOL N/A 11/05/2016   Procedure: ESOPHAGOGASTRODUODENOSCOPY (EGD) WITH PROPOFOL;  Surgeon: Manya Silvas, MD;  Location: [facility] ENDOSCOPY;  Service: Endoscopy;  Laterality: N/A;  . ESOPHAGOGASTRODUODENOSCOPY (EGD) WITH PROPOFOL N/A 02/06/2017   Procedure: ESOPHAGOGASTRODUODENOSCOPY (EGD) WITH PROPOFOL;  Surgeon: Manya Silvas, MD;  Location: [facility] ENDOSCOPY;  Service: Endoscopy;  Laterality: N/A;  . ESOPHAGOGASTRODUODENOSCOPY (EGD) WITH PROPOFOL N/A 04/24/2017   Procedure: ESOPHAGOGASTRODUODENOSCOPY (EGD) WITH PROPOFOL;  Surgeon: Manya Silvas, MD;  Location: [facility] ENDOSCOPY;  Service: Endoscopy;  Laterality: N/A;  . ESOPHAGOGASTRODUODENOSCOPY (EGD) WITH PROPOFOL N/A 07/31/2017   Procedure: ESOPHAGOGASTRODUODENOSCOPY (EGD) WITH PROPOFOL;  Surgeon: Manya Silvas, MD;  Location: [facility] ENDOSCOPY;  Service: Endoscopy;  Laterality: N/A;  . ESOPHAGOGASTRODUODENOSCOPY (EGD) WITH PROPOFOL N/A 08/08/2018   Procedure: ESOPHAGOGASTRODUODENOSCOPY (EGD) WITH PROPOFOL;  Surgeon: Manya Silvas, MD;  Location: [facility] ENDOSCOPY;  Service: Endoscopy;  Laterality: N/A;  . GIVENS CAPSULE STUDY N/A 12/05/2017   Procedure: GIVENS CAPSULE STUDY;  Surgeon: Manya Silvas, MD;  Location: [facility] ENDOSCOPY;  Service: Endoscopy;  Laterality: N/A;  . TONSILLECTOMY    . TONSILLECTOMY AND ADENOIDECTOMY    . ULNAR NERVE TRANSPOSITION    . UVULOPALATOPHARYNGOPLASTY      Allergies  No Known Allergies  History of Present Illness    ABRHAM MASLOWSKI is a 69 y.o. male with PMH as above. ***  Home Medications    Prior to Admission medications   Medication Sig Start Date End Date Taking? Authorizing Provider  atorvastatin  (LIPITOR) 20 MG tablet Take 20 mg by mouth daily. 03/23/20   [provider]  ferrous sulfate 325 (65 FE) MG tablet Take 1 tablet (325 mg total) by mouth daily. 05/04/20   Nicole Kindred A, DO  hydrALAZINE (APRESOLINE) 50 MG tablet Take 1 tablet (50 mg total) by mouth 2 (two) times daily. 05/03/20   Ezekiel Slocumb, DO  Ipratropium-Albuterol (COMBIVENT RESPIMAT) 20-100 MCG/ACT AERS respimat Inhale 1 puff into the lungs every 6 (six) hours. 02/12/20   Loletha Grayer, MD  lactulose (CHRONULAC) 10 GM/15ML solution Take 90 mLs (60 g total) by mouth daily. 05/04/20   Ezekiel Slocumb, DO  LORazepam (ATIVAN) 0.5 MG tablet Take 1 tablet (0.5 mg total) by mouth 3 (three) times daily as needed for Anxiety    [provider]  Multiple Vitamin (MULTIVITAMIN) tablet Take 1 tablet by mouth daily.    [provider]  NOVOLOG MIX 70/30 FLEXPEN (70-30) 100 UNIT/ML FlexPen as directed. 0-84 Units Subcutaneous 2 times Daily before meals 03/17/20   [provider]  pantoprazole (PROTONIX) 40 MG tablet Take 1 tablet (40 mg total) by mouth 2 (two) times daily. 03/06/19 04/23/20  Dustin Flock, MD  potassium chloride (KLOR-CON) 10 MEQ tablet Take 10 mEq by mouth daily. 04/21/20   [provider]  rifaximin (XIFAXAN) 550 MG TABS tablet Take 1 tablet (550 mg total) by mouth 2 (two) times daily. 02/12/20   Loletha Grayer, MD  sodium chloride (OCEAN) 0.65 % SOLN nasal spray Place 1 spray into both nostrils as needed for congestion (nose irritation). 02/12/20   Loletha Grayer, MD  tamsulosin (FLOMAX) 0.4 MG CAPS capsule Take 1 capsule (  0.4 mg total) by mouth daily after supper. 05/03/20   Ezekiel Slocumb, DO  torsemide (DEMADEX) 100 MG tablet Take 1 tablet (100 mg total) by mouth daily. 05/04/20   Ezekiel Slocumb, DO  traMADol (ULTRAM) 50 MG tablet Take 1 tablet (50 mg total) by mouth every 8 (eight) hours as needed for moderate pain. 02/27/20   Jennye Boroughs, MD    Review of Systems      ***.   All other systems reviewed and are otherwise negative except as noted above.  Physical Exam    VS:  There were no vitals taken for this visit. , BMI There is no height or weight on file to calculate BMI. GEN: Well nourished, well developed, in no acute distress. HEENT: normal. Neck: Supple, no JVD, carotid bruits, or masses. Cardiac: RRR, no murmurs, rubs, or gallops. No clubbing, cyanosis, edema.  Radials/DP/PT 2+ and equal bilaterally.  Respiratory:  Respirations regular and unlabored, clear to auscultation bilaterally. GI: Soft, nontender, nondistended, BS + x 4. MS: no deformity or atrophy. Skin: warm and dry, no rash. Neuro:  Strength and sensation are intact. Psych: Normal affect.  Accessory Clinical Findings    ECG personally reviewed by me today - *** - no acute changes.  VITALS Reviewed today   Temp Readings from Last 3 Encounters:  05/10/20 (!) 96 F (35.6 C) (Tympanic)  05/06/20 98.6 F (37 C) (Tympanic)  05/03/20 97.7 F (36.5 C)   BP Readings from Last 3 Encounters:  05/10/20 (!) 155/68  05/06/20 (!) 134/48  05/06/20 (!) 139/48   Pulse Readings from Last 3 Encounters:  05/10/20 66  05/06/20 74  05/06/20 86    Wt Readings from Last 3 Encounters:  05/06/20 281 lb 4.8 oz (127.6 kg)  05/03/20 278 lb (126.1 kg)  04/20/20 (!) 307 lb 6 oz (139.4 kg)     LABS  reviewed today   Lab Results  Component Value Date   WBC 4.3 05/09/2020   HGB 6.7 (L) 05/09/2020   HCT 21.0 (L) 05/09/2020   MCV 95.9 05/09/2020   PLT 102 (L) 05/09/2020   Lab Results  Component Value Date   CREATININE 3.21 (H) 05/03/2020   BUN 72 (H) 05/03/2020   NA 134 (L) 05/03/2020   K 4.1 05/03/2020   CL 95 (L) 05/03/2020   CO2 29 05/03/2020   Lab Results  Component Value Date   ALT 19 04/23/2020   AST 32 04/23/2020   ALKPHOS 91 04/23/2020   BILITOT 1.0 04/23/2020   Lab Results  Component Value Date   CHOL 141 12/15/2019   HDL 37 (L) 12/15/2019   LDLCALC 93  12/15/2019   TRIG 56 12/15/2019   CHOLHDL 3.8 12/15/2019    Lab Results  Component Value Date   HGBA1C 6.9 (H) 02/23/2020   Lab Results  Component Value Date   TSH 2.627 02/03/2019     STUDIES/PROCEDURES reviewed today   ***  Assessment & Plan    ***  Medication changes: *** Labs ordered: *** Studies / Imaging ordered: *** Future considerations: *** Disposition: ***  Total time spent with patient today *** minutes. This includes reviewing records, evaluating the patient, and coordinating care. Face-to-face time >50%.    Arvil Chaco, PA-C 05/16/2020

## 2020-05-17 ENCOUNTER — Encounter: Payer: Self-pay | Admitting: Physician Assistant

## 2020-05-18 ENCOUNTER — Encounter: Payer: Self-pay | Admitting: Emergency Medicine

## 2020-05-18 ENCOUNTER — Other Ambulatory Visit: Payer: Self-pay

## 2020-05-18 DIAGNOSIS — F329 Major depressive disorder, single episode, unspecified: Secondary | ICD-10-CM | POA: Diagnosis present

## 2020-05-18 DIAGNOSIS — E876 Hypokalemia: Secondary | ICD-10-CM | POA: Diagnosis present

## 2020-05-18 DIAGNOSIS — Z8249 Family history of ischemic heart disease and other diseases of the circulatory system: Secondary | ICD-10-CM

## 2020-05-18 DIAGNOSIS — Z87891 Personal history of nicotine dependence: Secondary | ICD-10-CM

## 2020-05-18 DIAGNOSIS — Z66 Do not resuscitate: Secondary | ICD-10-CM | POA: Diagnosis present

## 2020-05-18 DIAGNOSIS — K2270 Barrett's esophagus without dysplasia: Secondary | ICD-10-CM | POA: Diagnosis present

## 2020-05-18 DIAGNOSIS — E785 Hyperlipidemia, unspecified: Secondary | ICD-10-CM | POA: Diagnosis present

## 2020-05-18 DIAGNOSIS — N184 Chronic kidney disease, stage 4 (severe): Secondary | ICD-10-CM | POA: Diagnosis present

## 2020-05-18 DIAGNOSIS — G4730 Sleep apnea, unspecified: Secondary | ICD-10-CM | POA: Diagnosis present

## 2020-05-18 DIAGNOSIS — E1122 Type 2 diabetes mellitus with diabetic chronic kidney disease: Secondary | ICD-10-CM | POA: Diagnosis present

## 2020-05-18 DIAGNOSIS — R55 Syncope and collapse: Secondary | ICD-10-CM | POA: Diagnosis present

## 2020-05-18 DIAGNOSIS — I130 Hypertensive heart and chronic kidney disease with heart failure and stage 1 through stage 4 chronic kidney disease, or unspecified chronic kidney disease: Secondary | ICD-10-CM | POA: Diagnosis present

## 2020-05-18 DIAGNOSIS — I8510 Secondary esophageal varices without bleeding: Secondary | ICD-10-CM | POA: Diagnosis present

## 2020-05-18 DIAGNOSIS — K921 Melena: Principal | ICD-10-CM | POA: Diagnosis present

## 2020-05-18 DIAGNOSIS — Z6841 Body Mass Index (BMI) 40.0 and over, adult: Secondary | ICD-10-CM

## 2020-05-18 DIAGNOSIS — I5033 Acute on chronic diastolic (congestive) heart failure: Secondary | ICD-10-CM | POA: Diagnosis present

## 2020-05-18 DIAGNOSIS — K766 Portal hypertension: Secondary | ICD-10-CM | POA: Diagnosis present

## 2020-05-18 DIAGNOSIS — K7460 Unspecified cirrhosis of liver: Secondary | ICD-10-CM | POA: Diagnosis present

## 2020-05-18 DIAGNOSIS — K219 Gastro-esophageal reflux disease without esophagitis: Secondary | ICD-10-CM | POA: Diagnosis present

## 2020-05-18 DIAGNOSIS — D509 Iron deficiency anemia, unspecified: Secondary | ICD-10-CM | POA: Diagnosis present

## 2020-05-18 DIAGNOSIS — Z20822 Contact with and (suspected) exposure to covid-19: Secondary | ICD-10-CM | POA: Diagnosis present

## 2020-05-18 DIAGNOSIS — K31819 Angiodysplasia of stomach and duodenum without bleeding: Secondary | ICD-10-CM | POA: Diagnosis present

## 2020-05-18 DIAGNOSIS — Z794 Long term (current) use of insulin: Secondary | ICD-10-CM

## 2020-05-18 DIAGNOSIS — Z79899 Other long term (current) drug therapy: Secondary | ICD-10-CM

## 2020-05-18 DIAGNOSIS — K7200 Acute and subacute hepatic failure without coma: Secondary | ICD-10-CM | POA: Diagnosis present

## 2020-05-18 DIAGNOSIS — F419 Anxiety disorder, unspecified: Secondary | ICD-10-CM | POA: Diagnosis present

## 2020-05-18 DIAGNOSIS — D62 Acute posthemorrhagic anemia: Secondary | ICD-10-CM | POA: Diagnosis present

## 2020-05-18 DIAGNOSIS — G9341 Metabolic encephalopathy: Secondary | ICD-10-CM | POA: Diagnosis present

## 2020-05-18 LAB — BASIC METABOLIC PANEL
Anion gap: 16 — ABNORMAL HIGH (ref 5–15)
BUN: 101 mg/dL — ABNORMAL HIGH (ref 8–23)
CO2: 28 mmol/L (ref 22–32)
Calcium: 8.9 mg/dL (ref 8.9–10.3)
Chloride: 98 mmol/L (ref 98–111)
Creatinine, Ser: 1.22 mg/dL (ref 0.61–1.24)
GFR calc Af Amer: >60 mL/min (ref >60)
GFR calc non Af Amer: >60 mL/min (ref >60)
Glucose, Bld: 269 mg/dL — ABNORMAL HIGH (ref 70–99)
Potassium: 2.6 mmol/L — CL (ref 3.5–5.1)
Sodium: 142 mmol/L (ref 135–145)

## 2020-05-18 LAB — URINALYSIS, COMPLETE (UACMP) WITH MICROSCOPIC
Bacteria, UA: NONE SEEN
Bilirubin Urine: NEGATIVE
Glucose, UA: NEGATIVE mg/dL
Hgb urine dipstick: NEGATIVE
Ketones, ur: NEGATIVE mg/dL
Leukocytes,Ua: NEGATIVE
Nitrite: NEGATIVE
Protein, ur: NEGATIVE mg/dL
Specific Gravity, Urine: 1.009 (ref 1.005–1.030)
pH: 5 (ref 5.0–8.0)

## 2020-05-18 LAB — CBC
HCT: 22.4 % — ABNORMAL LOW (ref 39.0–52.0)
Hemoglobin: 7.3 g/dL — ABNORMAL LOW (ref 13.0–17.0)
MCH: 31.3 pg (ref 26.0–34.0)
MCHC: 32.6 g/dL (ref 30.0–36.0)
MCV: 96.1 fL (ref 80.0–100.0)
Platelets: 88 10*3/uL — ABNORMAL LOW (ref 150–400)
RBC: 2.33 MIL/uL — ABNORMAL LOW (ref 4.22–5.81)
RDW: 16.6 % — ABNORMAL HIGH (ref 11.5–15.5)
WBC: 4.8 10*3/uL (ref 4.0–10.5)
nRBC: 0 % (ref 0.0–0.2)

## 2020-05-18 NOTE — ED Triage Notes (Signed)
Patient presents to the ED stating he feels like he is going to pass out.  Patient states his doctor told him his hemoglobin is 6 and he is supposed to see his GI doctor tomorrow.  Patient states he is feeling short of breath when he walks and states that is normal for him when his blood level is low.

## 2020-05-19 ENCOUNTER — Inpatient Hospital Stay
Admission: EM | Admit: 2020-05-19 | Discharge: 2020-05-23 | DRG: 377 | Disposition: A | Discharge status: Home Health | Payer: Medicare Other | Attending: Family Medicine | Admitting: Family Medicine

## 2020-05-19 DIAGNOSIS — G9341 Metabolic encephalopathy: Secondary | ICD-10-CM | POA: Diagnosis present

## 2020-05-19 DIAGNOSIS — I5032 Chronic diastolic (congestive) heart failure: Secondary | ICD-10-CM | POA: Diagnosis not present

## 2020-05-19 DIAGNOSIS — I8510 Secondary esophageal varices without bleeding: Secondary | ICD-10-CM | POA: Diagnosis present

## 2020-05-19 DIAGNOSIS — K3189 Other diseases of stomach and duodenum: Secondary | ICD-10-CM

## 2020-05-19 DIAGNOSIS — K766 Portal hypertension: Secondary | ICD-10-CM | POA: Diagnosis present

## 2020-05-19 DIAGNOSIS — D509 Iron deficiency anemia, unspecified: Secondary | ICD-10-CM | POA: Diagnosis present

## 2020-05-19 DIAGNOSIS — K219 Gastro-esophageal reflux disease without esophagitis: Secondary | ICD-10-CM | POA: Diagnosis present

## 2020-05-19 DIAGNOSIS — I8501 Esophageal varices with bleeding: Secondary | ICD-10-CM | POA: Diagnosis not present

## 2020-05-19 DIAGNOSIS — D62 Acute posthemorrhagic anemia: Secondary | ICD-10-CM

## 2020-05-19 DIAGNOSIS — E876 Hypokalemia: Secondary | ICD-10-CM | POA: Diagnosis present

## 2020-05-19 DIAGNOSIS — N184 Chronic kidney disease, stage 4 (severe): Secondary | ICD-10-CM | POA: Diagnosis present

## 2020-05-19 DIAGNOSIS — K7460 Unspecified cirrhosis of liver: Secondary | ICD-10-CM | POA: Diagnosis present

## 2020-05-19 DIAGNOSIS — Z794 Long term (current) use of insulin: Secondary | ICD-10-CM

## 2020-05-19 DIAGNOSIS — K7290 Hepatic failure, unspecified without coma: Secondary | ICD-10-CM | POA: Diagnosis not present

## 2020-05-19 DIAGNOSIS — E66813 Obesity, class 3: Secondary | ICD-10-CM | POA: Diagnosis present

## 2020-05-19 DIAGNOSIS — K2270 Barrett's esophagus without dysplasia: Secondary | ICD-10-CM | POA: Diagnosis present

## 2020-05-19 DIAGNOSIS — K31819 Angiodysplasia of stomach and duodenum without bleeding: Secondary | ICD-10-CM | POA: Diagnosis present

## 2020-05-19 DIAGNOSIS — K922 Gastrointestinal hemorrhage, unspecified: Secondary | ICD-10-CM | POA: Diagnosis present

## 2020-05-19 DIAGNOSIS — E785 Hyperlipidemia, unspecified: Secondary | ICD-10-CM | POA: Diagnosis present

## 2020-05-19 DIAGNOSIS — I130 Hypertensive heart and chronic kidney disease with heart failure and stage 1 through stage 4 chronic kidney disease, or unspecified chronic kidney disease: Secondary | ICD-10-CM | POA: Diagnosis present

## 2020-05-19 DIAGNOSIS — D649 Anemia, unspecified: Secondary | ICD-10-CM | POA: Diagnosis present

## 2020-05-19 DIAGNOSIS — F329 Major depressive disorder, single episode, unspecified: Secondary | ICD-10-CM | POA: Diagnosis present

## 2020-05-19 DIAGNOSIS — E1121 Type 2 diabetes mellitus with diabetic nephropathy: Secondary | ICD-10-CM | POA: Diagnosis not present

## 2020-05-19 DIAGNOSIS — Z6841 Body Mass Index (BMI) 40.0 and over, adult: Secondary | ICD-10-CM | POA: Diagnosis not present

## 2020-05-19 DIAGNOSIS — R55 Syncope and collapse: Secondary | ICD-10-CM

## 2020-05-19 DIAGNOSIS — K921 Melena: Secondary | ICD-10-CM | POA: Diagnosis present

## 2020-05-19 DIAGNOSIS — R531 Weakness: Secondary | ICD-10-CM

## 2020-05-19 DIAGNOSIS — Z20822 Contact with and (suspected) exposure to covid-19: Secondary | ICD-10-CM | POA: Diagnosis present

## 2020-05-19 DIAGNOSIS — Z66 Do not resuscitate: Secondary | ICD-10-CM | POA: Diagnosis present

## 2020-05-19 DIAGNOSIS — E1122 Type 2 diabetes mellitus with diabetic chronic kidney disease: Secondary | ICD-10-CM | POA: Diagnosis present

## 2020-05-19 DIAGNOSIS — F419 Anxiety disorder, unspecified: Secondary | ICD-10-CM | POA: Diagnosis present

## 2020-05-19 DIAGNOSIS — I8500 Esophageal varices without bleeding: Secondary | ICD-10-CM | POA: Diagnosis present

## 2020-05-19 DIAGNOSIS — I5033 Acute on chronic diastolic (congestive) heart failure: Secondary | ICD-10-CM | POA: Diagnosis present

## 2020-05-19 DIAGNOSIS — K7682 Hepatic encephalopathy: Secondary | ICD-10-CM | POA: Diagnosis present

## 2020-05-19 DIAGNOSIS — K7200 Acute and subacute hepatic failure without coma: Secondary | ICD-10-CM | POA: Diagnosis present

## 2020-05-19 LAB — COMPREHENSIVE METABOLIC PANEL
ALT: 15 U/L (ref 0–44)
AST: 29 U/L (ref 15–41)
Albumin: 3 g/dL — ABNORMAL LOW (ref 3.5–5.0)
Alkaline Phosphatase: 68 U/L (ref 38–126)
Anion gap: 13 (ref 5–15)
BUN: 103 mg/dL — ABNORMAL HIGH (ref 8–23)
CO2: 30 mmol/L (ref 22–32)
Calcium: 8.9 mg/dL (ref 8.9–10.3)
Chloride: 100 mmol/L (ref 98–111)
Creatinine, Ser: 2.92 mg/dL — ABNORMAL HIGH (ref 0.61–1.24)
GFR calc Af Amer: 24 mL/min — ABNORMAL LOW (ref >60)
GFR calc non Af Amer: 21 mL/min — ABNORMAL LOW (ref >60)
Glucose, Bld: 193 mg/dL — ABNORMAL HIGH (ref 70–99)
Potassium: 2.6 mmol/L — CL (ref 3.5–5.1)
Sodium: 143 mmol/L (ref 135–145)
Total Bilirubin: 1.3 mg/dL — ABNORMAL HIGH (ref 0.3–1.2)
Total Protein: 6.5 g/dL (ref 6.5–8.1)

## 2020-05-19 LAB — GLUCOSE, CAPILLARY
Glucose-Capillary: 256 mg/dL — ABNORMAL HIGH (ref 70–99)
Glucose-Capillary: 177 mg/dL — ABNORMAL HIGH (ref 70–99)
Glucose-Capillary: 156 mg/dL — ABNORMAL HIGH (ref 70–99)
Glucose-Capillary: 151 mg/dL — ABNORMAL HIGH (ref 70–99)
Glucose-Capillary: 151 mg/dL — ABNORMAL HIGH (ref 70–99)

## 2020-05-19 LAB — TROPONIN I (HIGH SENSITIVITY): Troponin I (High Sensitivity): 25 ng/L — ABNORMAL HIGH (ref <18)

## 2020-05-19 LAB — HEMOGLOBIN A1C
Hgb A1c MFr Bld: 5.7 % — ABNORMAL HIGH (ref 4.8–5.6)
Mean Plasma Glucose: 116.89 mg/dL

## 2020-05-19 LAB — CBC WITH DIFFERENTIAL/PLATELET
Abs Immature Granulocytes: 0.02 10*3/uL (ref 0.00–0.07)
Basophils Absolute: 0 10*3/uL (ref 0.0–0.1)
Basophils Relative: 0 %
Eosinophils Absolute: 0 10*3/uL (ref 0.0–0.5)
Eosinophils Relative: 0 %
HCT: 23.3 % — ABNORMAL LOW (ref 39.0–52.0)
Hemoglobin: 7.7 g/dL — ABNORMAL LOW (ref 13.0–17.0)
Immature Granulocytes: 0 %
Lymphocytes Relative: 8 %
Lymphs Abs: 0.4 10*3/uL — ABNORMAL LOW (ref 0.7–4.0)
MCH: 31 pg (ref 26.0–34.0)
MCHC: 33 g/dL (ref 30.0–36.0)
MCV: 94 fL (ref 80.0–100.0)
Monocytes Absolute: 0.3 10*3/uL (ref 0.1–1.0)
Monocytes Relative: 7 %
Neutro Abs: 3.9 10*3/uL (ref 1.7–7.7)
Neutrophils Relative %: 85 %
Platelets: 75 10*3/uL — ABNORMAL LOW (ref 150–400)
RBC: 2.48 MIL/uL — ABNORMAL LOW (ref 4.22–5.81)
RDW: 16.9 % — ABNORMAL HIGH (ref 11.5–15.5)
WBC: 4.6 10*3/uL (ref 4.0–10.5)
nRBC: 0 % (ref 0.0–0.2)

## 2020-05-19 LAB — CBC
HCT: 22 % — ABNORMAL LOW (ref 39.0–52.0)
Hemoglobin: 7.4 g/dL — ABNORMAL LOW (ref 13.0–17.0)
MCH: 31.8 pg (ref 26.0–34.0)
MCHC: 33.6 g/dL (ref 30.0–36.0)
MCV: 94.4 fL (ref 80.0–100.0)
Platelets: 82 10*3/uL — ABNORMAL LOW (ref 150–400)
RBC: 2.33 MIL/uL — ABNORMAL LOW (ref 4.22–5.81)
RDW: 16.4 % — ABNORMAL HIGH (ref 11.5–15.5)
WBC: 4.2 10*3/uL (ref 4.0–10.5)
nRBC: 0 % (ref 0.0–0.2)

## 2020-05-19 LAB — PREPARE RBC (CROSSMATCH)

## 2020-05-19 LAB — MAGNESIUM: Magnesium: 2.2 mg/dL (ref 1.7–2.4)

## 2020-05-19 LAB — SARS CORONAVIRUS 2 BY RT PCR (HOSPITAL ORDER, PERFORMED IN ~~LOC~~ HOSPITAL LAB): SARS Coronavirus 2: NEGATIVE

## 2020-05-19 LAB — AMMONIA: Ammonia: 147 umol/L — ABNORMAL HIGH (ref 9–35)

## 2020-05-19 MED ORDER — ONDANSETRON HCL 4 MG/2ML IJ SOLN
4.0000 mg | Freq: Four times a day (QID) | INTRAMUSCULAR | Status: DC | PRN
  Administered 2020-05-19: 4 mg via INTRAVENOUS
  Filled 2020-05-19: qty 2

## 2020-05-19 MED ORDER — SODIUM CHLORIDE 0.9% IV SOLUTION
Freq: Once | INTRAVENOUS | Status: DC
  Filled 2020-05-19: qty 250

## 2020-05-19 MED ORDER — SODIUM CHLORIDE 0.9 % IV SOLN
10.0000 mL/h | Freq: Once | INTRAVENOUS | Status: AC
  Administered 2020-05-19: 10 mL/h via INTRAVENOUS

## 2020-05-19 MED ORDER — HYDRALAZINE HCL 20 MG/ML IJ SOLN
10.0000 mg | INTRAMUSCULAR | Status: DC | PRN
  Administered 2020-05-19: 10 mg via INTRAVENOUS
  Filled 2020-05-19: qty 1

## 2020-05-19 MED ORDER — ONDANSETRON HCL 4 MG PO TABS: 4.0000 mg | ORAL_TABLET | Freq: Four times a day (QID) | ORAL | Status: DC | PRN

## 2020-05-19 MED ORDER — POTASSIUM CHLORIDE 10 MEQ/100ML IV SOLN
10.0000 meq | INTRAVENOUS | Status: AC
  Administered 2020-05-19 (×4): 10 meq via INTRAVENOUS
  Filled 2020-05-19: qty 100

## 2020-05-19 MED ORDER — ACETAMINOPHEN 650 MG RE SUPP: 650.0000 mg | Freq: Four times a day (QID) | RECTAL | Status: DC | PRN

## 2020-05-19 MED ORDER — ACETAMINOPHEN 325 MG PO TABS: 650.0000 mg | ORAL_TABLET | Freq: Four times a day (QID) | ORAL | Status: DC | PRN

## 2020-05-19 MED ORDER — POTASSIUM CHLORIDE 10 MEQ/100ML IV SOLN
10.0000 meq | Freq: Once | INTRAVENOUS | Status: AC
  Administered 2020-05-19: 10 meq via INTRAVENOUS
  Filled 2020-05-19: qty 100

## 2020-05-19 MED ORDER — INSULIN ASPART 100 UNIT/ML ~~LOC~~ SOLN
0.0000 [IU] | SUBCUTANEOUS | Status: DC
  Administered 2020-05-19: 11 [IU] via SUBCUTANEOUS
  Administered 2020-05-19 (×4): 4 [IU] via SUBCUTANEOUS
  Administered 2020-05-20 (×5): 3 [IU] via SUBCUTANEOUS
  Administered 2020-05-21 (×2): 7 [IU] via SUBCUTANEOUS
  Administered 2020-05-22: 3 [IU] via SUBCUTANEOUS
  Administered 2020-05-22 (×3): 4 [IU] via SUBCUTANEOUS
  Administered 2020-05-23: 3 [IU] via SUBCUTANEOUS
  Filled 2020-05-19 (×17): qty 1

## 2020-05-19 MED ORDER — CIPROFLOXACIN IN D5W 400 MG/200ML IV SOLN
400.0000 mg | Freq: Two times a day (BID) | INTRAVENOUS | Status: DC
  Administered 2020-05-19: 400 mg via INTRAVENOUS
  Filled 2020-05-19: qty 200

## 2020-05-19 MED ORDER — SODIUM CHLORIDE 0.9 % IV SOLN
80.0000 mg | Freq: Once | INTRAVENOUS | Status: AC
  Administered 2020-05-19: 04:00:00 80 mg via INTRAVENOUS
  Filled 2020-05-19: qty 80

## 2020-05-19 MED ORDER — FUROSEMIDE 10 MG/ML IJ SOLN
20.0000 mg | Freq: Once | INTRAMUSCULAR | Status: AC
  Administered 2020-05-19: 20 mg via INTRAVENOUS

## 2020-05-19 MED ORDER — SODIUM CHLORIDE 0.9 % IV SOLN
2.0000 g | INTRAVENOUS | Status: DC
  Administered 2020-05-19 – 2020-05-22 (×4): 2 g via INTRAVENOUS
  Filled 2020-05-19: qty 20
  Filled 2020-05-19 (×2): qty 2
  Filled 2020-05-19: qty 20
  Filled 2020-05-19: qty 2

## 2020-05-19 MED ORDER — SODIUM CHLORIDE 0.9 % IV SOLN
8.0000 mg/h | INTRAVENOUS | Status: DC
  Administered 2020-05-19 – 2020-05-20 (×4): 8 mg/h via INTRAVENOUS
  Filled 2020-05-19 (×4): qty 80

## 2020-05-19 MED ORDER — SODIUM CHLORIDE 0.9 % IV SOLN: 1.0000 g | INTRAVENOUS | Status: DC

## 2020-05-19 MED ORDER — FUROSEMIDE 10 MG/ML IJ SOLN
INTRAMUSCULAR | Status: AC
  Administered 2020-05-19: 20 mg via INTRAVENOUS
  Filled 2020-05-19: qty 4

## 2020-05-19 MED ORDER — SODIUM CHLORIDE 0.9 % IV SOLN: INTRAVENOUS | Status: DC

## 2020-05-19 MED ORDER — FUROSEMIDE 10 MG/ML IJ SOLN
20.0000 mg | Freq: Once | INTRAMUSCULAR | Status: AC
  Filled 2020-05-19: qty 4

## 2020-05-19 MED ORDER — POTASSIUM CHLORIDE 10 MEQ/100ML IV SOLN
10.0000 meq | Freq: Once | INTRAVENOUS | Status: AC
  Administered 2020-05-19: 10 meq via INTRAVENOUS

## 2020-05-19 MED ORDER — LACTULOSE 10 GM/15ML PO SOLN
30.0000 g | Freq: Once | ORAL | Status: AC
  Administered 2020-05-19: 30 g via ORAL
  Filled 2020-05-19: qty 60

## 2020-05-19 NOTE — Consult Note (Signed)
Corey West , MD [address] [place], [place], [place], [address] [address], [place], [place], [place], [address] Phone: [phone]  Fax: [phone]  Consultation  Referring Provider:     ED  primary Care Physician:  Leonel Ramsay, MD Primary Gastroenterologist:  Dr. [name]         Reason for Consultation:     GI bleed  Date of Admission:  05/19/2020 Date of Consultation:  05/19/2020         HPI:   Corey West is a 69 y.o. male Follows up with [facility] clinic gastroenterology last seen at their office on 04/15/2020.  Carries a history of cirrhosis of the liver.  Known to have portal hypertensive gastropathy and gave in June 2020 as per EGD.  Colonoscopy was in 2006 and a 3 mm tubular adenoma was resected.  Subsequent colonoscopies were deferred due to comorbidities.  He also has a main duct IPMN that is being followed up has been receiving IV iron for iron deficiency anemia.  Recent imaging on 04/23/2020: CT scan of the abdomen and pelvis without contrast.  Features of cirrhosis, portal hypertension, cholelithiasis right-sided hydrocele, small umbilical hernia.  Admitted on 05/19/2020 for hemoglobin of 6.7 g and shortness of breath at the doctor's office.  Had been having black-colored stools.  Received a blood transfusion.  Low potassium of 2.6.Iron studies on 05/06/2020 demonstrated normal levels.  Baseline hemoglobin has been running around 7 to 8 g.  This morning hemoglobin is 7.4 g BUN elevated at 101 creatinine 1.22.  He says has been having black-colored stools for the past few weeks on and off.  Denies any hematemesis.  Denies any NSAID use.  Denies any abdominal pain.  He was slightly confused during giving the history.  He kept wanting to get out of the bed and walk out.   Past Medical History:  Diagnosis Date  . Anemia   . Anxiety    controlled;   . Arthritis   . AVM (arteriovenous malformation) of stomach, acquired with hemorrhage   . Barrett's esophagus     . CHF (congestive heart failure) ([place])   . Chronic kidney disease    renal infufficiency  . Cirrhosis ([place])   . Depression    controlled;   Marland Kitchen Diabetes mellitus without complication ([place])    not controlled, taking insulin but sugar continues to run high;   . Edema   . Esophageal varices ([place])   . GAVE (gastric antral vascular ectasia)   . GERD (gastroesophageal reflux disease)   . History of hiatal hernia   . Hyperlipidemia   . Hypertension    controlled well;   . Liver cirrhosis ([place])   . Nephropathy, diabetic ([place])   . Obesity   . Pancytopenia ([place])   . Polyp, stomach    with chronic blood loss  . Sleep apnea    does not wear a cpap, Medicare would not pay for it   . Venous stasis dermatitis of both lower extremities   . Venous stasis of both lower extremities    with cellulitis    Past Surgical History:  Procedure Laterality Date  . ESOPHAGOGASTRODUODENOSCOPY N/A 02/09/2015   Procedure: ESOPHAGOGASTRODUODENOSCOPY (EGD);  Surgeon: Manya Silvas, MD;  Location[facility] ENDOSCOPY;  Service: Endoscopy;  Laterality: N/A;  . ESOPHAGOGASTRODUODENOSCOPY N/A 07/22/2015   Procedure: ESOPHAGOGASTRODUODENOSCOPY (EGD);  Surgeon: Manya Silvas, MD;  Location: [facility] ENDOSCOPY;  Service: Endoscopy;  Laterality: N/A;  . ESOPHAGOGASTRODUODENOSCOPY N/A 06/26/2017  Procedure: ESOPHAGOGASTRODUODENOSCOPY (EGD);  Surgeon: Manya Silvas, MD;  Location: [facility] ENDOSCOPY;  Service: Endoscopy;  Laterality: N/A;  . ESOPHAGOGASTRODUODENOSCOPY N/A 06/27/2017   Procedure: ESOPHAGOGASTRODUODENOSCOPY (EGD);  Surgeon: Manya Silvas, MD;  Location: [facility] ENDOSCOPY;  Service: Endoscopy;  Laterality: N/A;  . ESOPHAGOGASTRODUODENOSCOPY N/A 06/28/2017   Procedure: ESOPHAGOGASTRODUODENOSCOPY (EGD);  Surgeon: Manya Silvas, MD;  Location: [facility] ENDOSCOPY;  Service: Endoscopy;  Laterality: N/A;  . ESOPHAGOGASTRODUODENOSCOPY Bilateral 10/11/2017   Procedure: ESOPHAGOGASTRODUODENOSCOPY (EGD);  Surgeon:  Manya Silvas, MD;  Location: [facility] ENDOSCOPY;  Service: Endoscopy;  Laterality: Bilateral;  . ESOPHAGOGASTRODUODENOSCOPY N/A 12/04/2017   Procedure: ESOPHAGOGASTRODUODENOSCOPY (EGD);  Surgeon: Manya Silvas, MD;  Location: [facility] ENDOSCOPY;  Service: Endoscopy;  Laterality: N/A;  . ESOPHAGOGASTRODUODENOSCOPY N/A 03/06/2019   Procedure: ESOPHAGOGASTRODUODENOSCOPY (EGD);  Surgeon: [place], Benay Pike, MD;  Location: [facility] ENDOSCOPY;  Service: Gastroenterology;  Laterality: N/A;  . ESOPHAGOGASTRODUODENOSCOPY (EGD) WITH PROPOFOL N/A 07/20/2015   Procedure: ESOPHAGOGASTRODUODENOSCOPY (EGD) WITH PROPOFOL;  Surgeon: Manya Silvas, MD;  Location: [facility] ENDOSCOPY;  Service: Endoscopy;  Laterality: N/A;  . ESOPHAGOGASTRODUODENOSCOPY (EGD) WITH PROPOFOL N/A 09/16/2015   Procedure: ESOPHAGOGASTRODUODENOSCOPY (EGD) WITH PROPOFOL;  Surgeon: Manya Silvas, MD;  Location: [facility] ENDOSCOPY;  Service: Endoscopy;  Laterality: N/A;  . ESOPHAGOGASTRODUODENOSCOPY (EGD) WITH PROPOFOL N/A 03/16/2016   Procedure: ESOPHAGOGASTRODUODENOSCOPY (EGD) WITH PROPOFOL;  Surgeon: Manya Silvas, MD;  Location: [facility] ENDOSCOPY;  Service: Endoscopy;  Laterality: N/A;  . ESOPHAGOGASTRODUODENOSCOPY (EGD) WITH PROPOFOL N/A 09/14/2016   Procedure: ESOPHAGOGASTRODUODENOSCOPY (EGD) WITH PROPOFOL;  Surgeon: Manya Silvas, MD;  Location: [facility] ENDOSCOPY;  Service: Endoscopy;  Laterality: N/A;  . ESOPHAGOGASTRODUODENOSCOPY (EGD) WITH PROPOFOL N/A 11/05/2016   Procedure: ESOPHAGOGASTRODUODENOSCOPY (EGD) WITH PROPOFOL;  Surgeon: Manya Silvas, MD;  Location: [facility] ENDOSCOPY;  Service: Endoscopy;  Laterality: N/A;  . ESOPHAGOGASTRODUODENOSCOPY (EGD) WITH PROPOFOL N/A 02/06/2017   Procedure: ESOPHAGOGASTRODUODENOSCOPY (EGD) WITH PROPOFOL;  Surgeon: Manya Silvas, MD;  Location: [facility] ENDOSCOPY;  Service: Endoscopy;  Laterality: N/A;  . ESOPHAGOGASTRODUODENOSCOPY (EGD) WITH PROPOFOL N/A 04/24/2017   Procedure: ESOPHAGOGASTRODUODENOSCOPY (EGD)  WITH PROPOFOL;  Surgeon: Manya Silvas, MD;  Location: [facility] ENDOSCOPY;  Service: Endoscopy;  Laterality: N/A;  . ESOPHAGOGASTRODUODENOSCOPY (EGD) WITH PROPOFOL N/A 07/31/2017   Procedure: ESOPHAGOGASTRODUODENOSCOPY (EGD) WITH PROPOFOL;  Surgeon: Manya Silvas, MD;  Location: [facility] ENDOSCOPY;  Service: Endoscopy;  Laterality: N/A;  . ESOPHAGOGASTRODUODENOSCOPY (EGD) WITH PROPOFOL N/A 08/08/2018   Procedure: ESOPHAGOGASTRODUODENOSCOPY (EGD) WITH PROPOFOL;  Surgeon: Manya Silvas, MD;  Location: [facility] ENDOSCOPY;  Service: Endoscopy;  Laterality: N/A;  . GIVENS CAPSULE STUDY N/A 12/05/2017   Procedure: GIVENS CAPSULE STUDY;  Surgeon: Manya Silvas, MD;  Location: [facility] ENDOSCOPY;  Service: Endoscopy;  Laterality: N/A;  . TONSILLECTOMY    . TONSILLECTOMY AND ADENOIDECTOMY    . ULNAR NERVE TRANSPOSITION    . UVULOPALATOPHARYNGOPLASTY      Prior to Admission medications   Medication Sig Start Date End Date Taking? Authorizing Provider  ferrous sulfate 325 (65 FE) MG tablet Take 1 tablet (325 mg total) by mouth daily. 05/04/20  Yes Nicole Kindred A, DO  hydrALAZINE (APRESOLINE) 50 MG tablet Take 1 tablet (50 mg total) by mouth 2 (two) times daily. 05/03/20  Yes Nicole Kindred A, DO  lactulose (CHRONULAC) 10 GM/15ML solution Take 90 mLs (60 g total) by mouth daily. Patient taking differently: Take 45 g by mouth 2 (two) times daily.  05/04/20  Yes Nicole Kindred A, DO  LORazepam (ATIVAN) 0.5 MG tablet Take 1 tablet (0.5 mg total) by mouth 3 (  three) times daily as needed for Anxiety   Yes [provider]  metolazone (ZAROXOLYN) 5 MG tablet Take 5 mg by mouth daily. 05/13/20  Yes [provider]  NOVOLOG MIX 70/30 FLEXPEN (70-30) 100 UNIT/ML FlexPen 80-84 Units as directed. Inject 84 Units Subcutaneous every morning at breakfast and 80 units at dinner 03/17/20  Yes [provider]  pantoprazole (PROTONIX) 40 MG tablet Take 1 tablet (40 mg total) by mouth 2 (two) times daily.  03/06/19 05/19/20 Yes Dustin Flock, MD  potassium chloride (KLOR-CON) 10 MEQ tablet Take 10 mEq by mouth daily. 04/21/20  Yes [provider]  rifaximin (XIFAXAN) 550 MG TABS tablet Take 1 tablet (550 mg total) by mouth 2 (two) times daily. 02/12/20  Yes Wieting, Richard, MD  sodium chloride (OCEAN) 0.65 % SOLN nasal spray Place 1 spray into both nostrils as needed for congestion (nose irritation). 02/12/20  Yes Wieting, Richard, MD  torsemide (DEMADEX) 100 MG tablet Take 1 tablet (100 mg total) by mouth daily. 05/04/20  Yes Nicole Kindred A, DO  atorvastatin (LIPITOR) 20 MG tablet Take 20 mg by mouth daily. 03/23/20   [provider]  Ipratropium-Albuterol (COMBIVENT RESPIMAT) 20-100 MCG/ACT AERS respimat Inhale 1 puff into the lungs every 6 (six) hours. 02/12/20   Loletha Grayer, MD  tamsulosin (FLOMAX) 0.4 MG CAPS capsule Take 1 capsule (0.4 mg total) by mouth daily after supper. 05/03/20   Ezekiel Slocumb, DO    Family History  Problem Relation Age of Onset  . Diabetes Other   . Transient ischemic attack Father   . CAD Father      Social History   Tobacco Use  . Smoking status: Former Smoker    Years: 20.00    Types: Cigars, Cigarettes  . Smokeless tobacco: Never Used  Vaping Use  . Vaping Use: Never used  Substance Use Topics  . Alcohol use: No    Comment: stopped 15 years ago  . Drug use: No    Allergies as of 05/18/2020  . (No Known Allergies)    Review of Systems:    All systems reviewed and negative except where noted in HPI.   Physical Exam:  Vital signs in last 24 hours: Temp:  [97.8 F (36.6 C)-98.2 F (36.8 C)] 98.2 F (36.8 C) (08/19 0829) Pulse Rate:  [70-96] 76 (08/19 0829) Resp:  [15-20] 16 (08/19 0829) BP: (125-163)/(40-70) 140/49 (08/19 0829) SpO2:  [97 %-100 %] 100 % (08/19 0829) Weight:  [182 kg] 127 kg (08/18 1432)   General:   Pleasant, cooperative in NAD Head:  Normocephalic and atraumatic. Eyes:   No icterus.   Conjunctiva  pink. PERRLA. Ears:  Normal auditory acuity. Neck:  Supple; no masses or thyroidomegaly Lungs: Respirations even and unlabored. Lungs clear to auscultation bilaterally.   No wheezes, crackles, or rhonchi.  Heart:  Regular rate and rhythm;  Without murmur, clicks, rubs or gallops Abdomen:  Soft, nondistended, nontender. Normal bowel sounds. No appreciable masses or hepatomegaly.  No rebound or guarding.  Neurologic:  Alert and oriented x2;  grossly normal neurologically.  Psych:  Alert and cooperative.  Appears a bit drowsy and confused  LAB RESULTS: Recent Labs    05/18/20 1432 05/19/20 0930  WBC 4.8 4.2  HGB 7.3* 7.4*  HCT 22.4* 22.0*  PLT 88* 82*   BMET Recent Labs    05/18/20 1432  NA 142  K 2.6*  CL 98  CO2 28  GLUCOSE 269*  BUN 101*  CREATININE  1.22  CALCIUM 8.9   LFT No results for input(s): PROT, ALBUMIN, AST, ALT, ALKPHOS, BILITOT, BILIDIR, IBILI in the last 72 hours. PT/INR No results for input(s): LABPROT, INR in the last 72 hours.  STUDIES: No results found.    Impression / Plan:   Corey West is a 69 y.o. y/o male with history of liver cirrhosis who follows up with Dr. [name] at [facility] clinic gastroenterology recently seen in June 2021.  Admitted with a low hemoglobin and recent history of melena.  Elevated BUN/creatinine ratio hemoglobin 6.7 g on admission usually runs between 7 and 8 g at baseline.  Has been receiving IV iron in the past.  History of gave and portal hypertensive gastropathy as per his last EGD about a year back.  Very likely has had an upper GI bleed as evidenced by melena and elevated BUN/creatinine ratio.  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  Presently has features of hepatic encephalopathy likely due to upper GI bleed and low electrolytes.  Plan 1.  IV antibiotics for prophylaxis in cirrhotic patient with a GI bleed 2.  IV PPI and octreotide 3.  Plan for EGD tomorrow after potassium is normalized. 4.  Monitor CBC and transfuse as needed. 5.   Commence on lactulose titrated to 2 soft bowel movements per day, Xifaxan for hepatic encephalopathy.   I have discussed alternative options, risks & benefits,  which include, but are not limited to, bleeding, infection, perforation,respiratory complication & drug reaction.  The patient agrees with this plan & written consent will be obtained.    Thank you for involving me in the care of this patient.      LOS: 0 days   Corey Bellows, MD  05/19/2020, 10:28 AM

## 2020-05-19 NOTE — Progress Notes (Signed)
PROGRESS NOTE    Patient: Corey West                            PCP: Leonel Ramsay, MD                    DOB: 10-23-1950            DOA: 05/19/2020 EPP:[id number]             DOS: 05/19/2020, 10:54 AM   LOS: 0 days   Date of Service: The patient was seen and examined on 05/19/2020  Subjective:   The patient was seen and examined this Am. Stable, NPO ... Stating he is not feeling well but denies of having any nausea vomiting abdominal pain Denies of having any active rectal bleed.   Brief Narrative:  Per HPI  Corey West is a 69 y.o. 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 fib not on anticoagulation,insulin-dependent type 2 diabetes, CKD stage IV, hypertension, hyperlipidemia, chronic venous stasis dermatitis and edema bilateral lower extremities, anxiety, and obesity, OSA noncompliant with CPAPwho presents to the ED for evaluation of progressive shortness of breath and lightheadedness with hemoglobin of 6 from doctor's office.   Patient has been having melena stool and was scheduled to follow-up with his GI on 8/19.   ED Course:  On arrival patient hemodynamically stable.  Hemoglobin 7.3.  Other pertinent labs include potassium 2.6, ammonia 147.  Troponin 25.  Patient was typed and crossed and started on 1 unit PRBC.  Hospitalist consulted for admission.   Assessment & Plan:   Active Problems:   Esophageal varices (HCC)   Upper GI bleed/melena   Hypokalemia   Hepatic encephalopathy (HCC)   GAVE (gastric antral vascular ectasia)   Obesity, Class III, BMI 40-49.9 (morbid obesity) (HCC)   Type 2 diabetes mellitus with diabetic nephropathy, with Corey-term current use of insulin (HCC)   Chronic diastolic CHF (congestive heart failure) (HCC)   Symptomatic anemia   Acute blood loss anemia     Upper GI bleed/melena / Symptomatic anemia  Acute  blood loss anemia /  Esophageal varices (HCC)  H/o  GAVE (gastric antral vascular ectasia) -Status post 1 unit of PRBC transfusion -Hemoglobin 6.5, 6.7, 7.3 >>> 7.4 -Still complaining of dizziness generalized weakness, stating not feeling well anticipating transfusing another unit of PRBC today  -Patient with symptomatic anemia, lightheadedness and shortness of breath, hemoglobin was 6.5 on admission with melena stool in the setting of history of given esophageal varices -Received 1 unit PRBC in the emergency room -GI consult.  Keeping n.p.o. -Recommended empiric antibiotics pending further evaluation    Chronic diastolic CHF (congestive heart failure) (HCC) -No signs of exacerbation, not complaining of shortness of breath -Lower extremity edema noted -Monitoring I's and O's, daily weight Not acutely exacerbated.  Continue home meds    Hypokalemia -Potassium 2.6. >>> 2.6   -We will replete with additional supplement potassium IV today We will check magnesium -Continue to monitor  Mild acute hepatic encephalopathy (HCC) Hepatic cirrhosis -Symptoms of lightheadedness may be due in part to elevated ammonia level of 146 -Received lactulose in the emergency room -Monitor neurologic status    Obesity, Class III, BMI 40-49.9 (morbid obesity) ([place]) -Complicating factor to overall prognosis and care    Type 2 diabetes mellitus with diabetic nephropathy, with  Corey-term current use of insulin ([place]) -Withholding home medication -Checking blood sugar every 6 hours with SSI coverage while n.p.o. -Once tolerating p.o. we will switch CBG to QA CHS, with SSI coverage     Nutritional status:          Antimicrobials: 05/19/2020 empiric IV antibiotics of ciprofloxacin    Consultants: Gastroenterologist   --------------------------------------------------------------------------------------------------------------------  DVT prophylaxis:  SCD/Compression stockings Code  Status:   Code Status: Full Code Family Communication: No family member present at bedside- attempt will be made to update daily The above findings and plan of care has been discussed with patient (and family )  in detail,  they expressed understanding and agreement of above. -Advance care planning has been discussed.   Admission status:    Status is: Observation  The patient remains OBS appropriate and will d/c before 2 midnights.  Dispo: The patient is from:               Anticipated d/c is to: ? Home when stable               Anticipated d/c date is: 2 days              Patient currently is not medically stable to d/c.        Procedures:   No admission procedures for hospital encounter.   ?  EGD colonoscopy  Antimicrobials:  Anti-infectives (From admission, onward)   Start     Dose/Rate Route Frequency Ordered Stop   05/19/20 1100  ciprofloxacin (CIPRO) IVPB 400 mg        400 mg 200 mL/hr over 60 Minutes Intravenous Every 12 hours 05/19/20 1054 05/24/20 1059       Medication:  . insulin aspart  0-20 Units Subcutaneous Q4H    acetaminophen **OR** acetaminophen, ondansetron **OR** ondansetron (ZOFRAN) IV   Objective:   Vitals:   05/19/20 0600 05/19/20 0615 05/19/20 0630 05/19/20 0829  BP: (!) 155/64 125/70 (!) 163/54 (!) 140/49  Pulse: 78 78 73 76  Resp: 18 17 15 16   Temp:  98.2 F (36.8 C)  98.2 F (36.8 C)  TempSrc:    Oral  SpO2: 98%  97% 100%  Weight:      Height:        Intake/Output Summary (Last 24 hours) at 05/19/2020 1054 Last data filed at 05/19/2020 9211 Gross per 24 hour  Intake 478.33 ml  Output --  Net 478.33 ml   Filed Weights   05/18/20 1432  Weight: 127 kg     Examination:   Physical Exam  Constitution:  Alert, cooperative, no distress,  Appears calm and comfortable  Psychiatric: Normal and stable mood and affect, cognition intact,   HEENT: Normocephalic, PERRL, otherwise with in Normal limits  Chest:Chest  symmetric Cardio vascular:  S1/S2, RRR, No murmure, No Rubs or Gallops  pulmonary: Clear to auscultation bilaterally, respirations unlabored, negative wheezes / crackles Abdomen: Soft, non-tender, non-distended, bowel sounds,no masses, no organomegaly Muscular skeletal: Limited exam - in bed, able to move all 4 extremities, Normal strength,  Neuro: CNII-XII intact. , normal motor and sensation, reflexes intact  Extremities: No pitting edema lower extremities, +2 pulses  Skin: Dry, warm to touch, negative for any Rashes, No open wounds Wounds: per nursing documentation    ------------------------------------------------------------------------------------------------------------------------------------------    LABs:  CBC Latest Ref Rng & Units 05/19/2020 05/18/2020 05/09/2020  WBC 4.0 - 10.5 K/uL 4.2 4.8 4.3  Hemoglobin 13.0 - 17.0 g/dL 7.4(L) 7.3(L) 6.7(L)  Hematocrit  39 - 52 % 22.0(L) 22.4(L) 21.0(L)  Platelets 150 - 400 K/uL 82(L) 88(L) 102(L)   CMP Latest Ref Rng & Units 05/18/2020 05/03/2020 05/02/2020  Glucose 70 - 99 mg/dL 269(H) 183(H) 178(H)  BUN 8 - 23 mg/dL 101(H) 72(H) 70(H)  Creatinine 0.61 - 1.24 mg/dL 1.22 3.21(H) 3.47(H)  Sodium 135 - 145 mmol/L 142 134(L) 132(L)  Potassium 3.5 - 5.1 mmol/L 2.6(LL) 4.1 4.0  Chloride 98 - 111 mmol/L 98 95(L) 93(L)  CO2 22 - 32 mmol/L 28 29 28   Calcium 8.9 - 10.3 mg/dL 8.9 9.0 8.6(L)  Total Protein 6.5 - 8.1 g/dL - - -  Total Bilirubin 0.3 - 1.2 mg/dL - - -  Alkaline Phos 38 - 126 U/L - - -  AST 15 - 41 U/L - - -  ALT 0 - 44 U/L - - -       Micro Results Recent Results (from the past 240 hour(s))  SARS Coronavirus 2 by RT PCR (hospital order, performed in [facility] hospital lab) Nasopharyngeal Nasopharyngeal Swab     Status: None   Collection Time: 05/19/20 12:10 AM   Specimen: Nasopharyngeal Swab  Result Value Ref Range Status   SARS Coronavirus 2 NEGATIVE NEGATIVE Final    Comment: (NOTE) SARS-CoV-2 target nucleic acids are  NOT DETECTED.  The SARS-CoV-2 RNA is generally detectable in upper and lower respiratory specimens during the acute phase of infection. The lowest concentration of SARS-CoV-2 viral copies this assay can detect is 250 copies / mL. A negative result does not preclude SARS-CoV-2 infection and should not be used as the sole basis for treatment or other patient management decisions.  A negative result may occur with improper specimen collection / handling, submission of specimen other than nasopharyngeal swab, presence of viral mutation(s) within the areas targeted by this assay, and inadequate number of viral copies (<250 copies / mL). A negative result must be combined with clinical observations, patient history, and epidemiological information.  Fact Sheet for Patients:   StrictlyIdeas.no  Fact Sheet for Healthcare Providers: BankingDealers.co.za  This test is not yet approved or  cleared by the [place] FDA and has been authorized for detection and/or diagnosis of SARS-CoV-2 by FDA under an Emergency Use Authorization (EUA).  This EUA will remain in effect (meaning this test can be used) for the duration of the COVID-19 declaration under Section 564(b)(1) of the Act, 21 U.S.C. section 360bbb-3(b)(1), unless the authorization is terminated or revoked sooner.  Performed at [facility], [address] [place], [place] [address]     Radiology Reports CT ABDOMEN PELVIS WO CONTRAST  Result Date: 04/23/2020 CLINICAL DATA:  69 year old male with history of abdominal pain and fever. EXAM: CT ABDOMEN AND PELVIS WITHOUT CONTRAST TECHNIQUE: Multidetector CT imaging of the abdomen and pelvis was performed following the standard protocol without IV contrast. COMPARISON:  No priors. FINDINGS: Lower chest: Trace left pleural effusion lying dependently. Diffuse low attenuation throughout the intravascular compartment, suggestive of  anemia. Atherosclerotic calcifications in the left anterior descending, left circumflex and right coronary arteries. Calcifications of the aortic valve. Hepatobiliary: Liver has a shrunken appearance and nodular contour, indicative of underlying cirrhosis. Calcified granulomas are noted in the liver. No discrete cystic or solid hepatic lesions are other identified on today's noncontrast CT examination. Calcified gallstones are noted lying dependently in the gallbladder measuring up to 9 mm. Gallbladder is not distended. Pancreas: No definite pancreatic mass or peripancreatic fluid collections or inflammatory changes are noted on today's  noncontrast CT examination. Spleen: Spleen is mildly enlarged measuring 16.7 x 7.1 x 16.9 cm (estimated splenic volume of 1,002 mL) . Adrenals/Urinary Tract: There are no abnormal calcifications within the collecting system of either kidney, along the course of either ureter, or within the lumen of the urinary bladder. No hydroureteronephrosis or perinephric stranding to suggest urinary tract obstruction at this time. The unenhanced appearance of the kidneys is unremarkable bilaterally. Unenhanced appearance of the urinary bladder is normal. Bilateral adrenal glands are normal in appearance. Stomach/Bowel: Unenhanced appearance of the stomach is normal. No pathologic dilatation of small bowel or colon. Appendix is not confidently identified may be surgically absent. Vascular/Lymphatic: Aortic atherosclerosis. Numerous portosystemic collateral pathways, most notable for large splenorenal collateral vessels. No lymphadenopathy noted in the abdomen or pelvis. Reproductive: Prostate gland and seminal vesicles are unremarkable in appearance. Large right-sided hydrocele incompletely imaged. Other: Small umbilical hernia containing only omental fat. Small volume of ascites. No pneumoperitoneum. Musculoskeletal: There are no aggressive appearing lytic or blastic lesions noted in the  visualized portions of the skeleton. IMPRESSION: 1. Morphologic changes in the liver compatible with cirrhosis. This is associated with evidence of portal venous hypertension, including splenomegaly and numerous portosystemic collateral vessels. 2. Cholelithiasis without evidence of acute cholecystitis at this time. Small umbilical hernia containing only omental fat large right-sided hydrocele incompletely imaged. 3. Trace left pleural effusion lying dependent 4. Diffuse low attenuation throughout the intravascular compartment, suggesting anemia. 5. Aortic atherosclerosis, in addition to least 3 vessel coronary artery disease. Please note that although the presence of coronary artery calcium documents the presence of coronary artery disease, the severity of this disease and any potential stenosis cannot be assessed on this non-gated CT examination. Assessment for potential risk factor modification, dietary therapy or pharmacologic therapy may be warranted, if clinically indicated. 6. There are calcifications of the aortic valve. Echocardiographic correlation for evaluation of potential valvular dysfunction may be warranted if clinically indicated. 7. Additional incidental findings, as above. Electronically Signed   By: Vinnie Langton M.D.   On: 04/23/2020 18:44   DG Chest Port 1 View  Result Date: 04/30/2020 CLINICAL DATA:  Shortness of breath. EXAM: PORTABLE CHEST 1 VIEW COMPARISON:  Radiograph 04/23/2020 FINDINGS: Stable cardiomegaly. Unchanged mediastinal contours. Vascular congestion which appears similar to prior exam. No acute or focal airspace disease. No pleural fluid or pneumothorax. IMPRESSION: Stable cardiomegaly and vascular congestion. No new abnormality. Electronically Signed   By: Keith Rake M.D.   On: 04/30/2020 16:46   DG Chest Port 1 View  Result Date: 04/23/2020 CLINICAL DATA:  Dyspnea, worsening shortness of breath and productive cough EXAM: PORTABLE CHEST 1 VIEW COMPARISON:   Radiograph 03/10/2020, CT 12/14/2019 FINDINGS: Chronically low lung volumes with some central vascular congestion similar to the comparison exam. No convincing features of frank edema at this time. No new focal areas of consolidation. No effusion or pneumothorax is visible. Mild cardiomegaly is similar to prior accounting for differences in technique. Telemetry leads overlie the chest. No acute osseous or soft tissue abnormality. Degenerative changes are present in the imaged spine and shoulders. Dextrocurvature of the thoracic spine similar to prior. IMPRESSION: Chronically low lung volumes with some central vascular congestion. No convincing features of frank edema or other acute cardiopulmonary abnormality. Stable cardiomegaly. Electronically Signed   By: Lovena Le M.D.   On: 04/23/2020 15:18   ECHOCARDIOGRAM COMPLETE  Result Date: 04/25/2020    ECHOCARDIOGRAM REPORT   Patient Name:   Corey West Date of Exam: 04/24/2020  Medical Rec #:  [id number]    Height:       70.0 in Accession #:    [id number]   Weight:       304.0 lb Date of Birth:  06-22-1951     BSA:          2.494 m Patient Age:    27 years     BP:           136/65 mmHg Patient Gender: M            HR:           63 bpm. Exam Location:  [facility] Procedure: 2D Echo, Cardiac Doppler and Color Doppler Indications:     CHF-Acute Diastolic 703.50 / K93.81  History:         Patient has prior history of Echocardiogram examinations. CHF;                  Risk Factors:Hypertension.  Sonographer:     Alyse Low Roar Referring Phys:  [id number] Athena Masse Diagnosing Phys: Kathlyn Sacramento MD IMPRESSIONS  1. Left ventricular ejection fraction, by estimation, is 50 to 55%. The left ventricle has low normal function. The left ventricle has no regional wall motion abnormalities. The left ventricular internal cavity size was mildly dilated. There is mild left ventricular hypertrophy. Left ventricular diastolic parameters are consistent with Grade II diastolic dysfunction  (pseudonormalization).  2. Right ventricular systolic function is normal. The right ventricular size is normal. There is mildly elevated pulmonary artery systolic pressure.  3. Left atrial size was moderately dilated.  4. Right atrial size was mildly dilated.  5. The mitral valve is normal in structure. Trivial mitral valve regurgitation. No evidence of mitral stenosis.  6. The aortic valve is abnormal. Aortic valve regurgitation is not visualized. Mild to moderate aortic valve stenosis. Aortic valve area, by VTI measures 1.36 cm. Aortic valve mean gradient measures 14.3 mmHg.  7. The inferior vena cava is dilated in size with <50% respiratory variability, suggesting right atrial pressure of 15 mmHg. FINDINGS  Left Ventricle: Left ventricular ejection fraction, by estimation, is 50 to 55%. The left ventricle has low normal function. The left ventricle has no regional wall motion abnormalities. The left ventricular internal cavity size was mildly dilated. There is mild left ventricular hypertrophy. Left ventricular diastolic parameters are consistent with Grade II diastolic dysfunction (pseudonormalization). Right Ventricle: The right ventricular size is normal. No increase in right ventricular wall thickness. Right ventricular systolic function is normal. There is mildly elevated pulmonary artery systolic pressure. The tricuspid regurgitant velocity is 2.59  m/s, and with an assumed right atrial pressure of 15 mmHg, the estimated right ventricular systolic pressure is 69.6 mmHg. Left Atrium: Left atrial size was moderately dilated. Right Atrium: Right atrial size was mildly dilated. Pericardium: There is no evidence of pericardial effusion. Mitral Valve: The mitral valve is normal in structure. Normal mobility of the mitral valve leaflets. Moderate mitral annular calcification. Trivial mitral valve regurgitation. No evidence of mitral valve stenosis. Tricuspid Valve: The tricuspid valve is normal in structure.  Tricuspid valve regurgitation is trivial. No evidence of tricuspid stenosis. Aortic Valve: The aortic valve is abnormal. . There is mild thickening and moderate calcification of the aortic valve. Aortic valve regurgitation is not visualized. Mild to moderate aortic stenosis is present. There is mild thickening of the aortic valve. There is moderate calcification of the aortic valve. Aortic valve mean gradient measures 14.3 mmHg. Aortic valve peak gradient measures  24.1 mmHg. Aortic valve area, by VTI measures 1.36 cm. Pulmonic Valve: The pulmonic valve was normal in structure. Pulmonic valve regurgitation is not visualized. No evidence of pulmonic stenosis. Aorta: The aortic root is normal in size and structure. Venous: The inferior vena cava is dilated in size with less than 50% respiratory variability, suggesting right atrial pressure of 15 mmHg. IAS/Shunts: No atrial level shunt detected by color flow Doppler.  LEFT VENTRICLE PLAX 2D LVIDd:         5.46 cm  Diastology LVIDs:         4.09 cm  LV e' lateral:   8.27 cm/s LV PW:         1.22 cm  LV E/e' lateral: 17.9 LV IVS:        1.34 cm  LV e' medial:    8.05 cm/s LVOT diam:     1.70 cm  LV E/e' medial:  18.4 LV SV:         76 LV SV Index:   30 LVOT Area:     2.27 cm  RIGHT VENTRICLE RV Mid diam:    3.78 cm RV S prime:     13.70 cm/s TAPSE (M-mode): 2.2 cm LEFT ATRIUM              Index       RIGHT ATRIUM           Index LA diam:        4.60 cm  1.84 cm/m  RA Area:     22.10 cm LA Vol (A2C):   117.0 ml 46.92 ml/m RA Volume:   71.80 ml  28.79 ml/m LA Vol (A4C):   101.0 ml 40.50 ml/m LA Biplane Vol: 112.0 ml 44.91 ml/m  AORTIC VALVE                    PULMONIC VALVE AV Area (Vmax):    1.35 cm     PV Vmax:        1.45 m/s AV Area (Vmean):   1.39 cm     PV Peak grad:   8.4 mmHg AV Area (VTI):     1.36 cm     RVOT Peak grad: 5 mmHg AV Vmax:           245.50 cm/s AV Vmean:          178.333 cm/s AV VTI:            0.555 m AV Peak Grad:      24.1 mmHg AV Mean  Grad:      14.3 mmHg LVOT Vmax:         146.00 cm/s LVOT Vmean:        109.000 cm/s LVOT VTI:          0.333 m LVOT/AV VTI ratio: 0.60  AORTA Ao Root diam: 3.00 cm MITRAL VALVE                TRICUSPID VALVE MV Area (PHT): 3.27 cm     TR Peak grad:   26.8 mmHg MV Decel Time: 232 msec     TR Vmax:        259.00 cm/s MV E velocity: 148.00 cm/s MV A velocity: 107.00 cm/s  SHUNTS MV E/A ratio:  1.38         Systemic VTI:  0.33 m MV A Prime:    11.4 cm/s    Systemic Diam: 1.70 cm Kathlyn Sacramento MD Electronically signed by  Kathlyn Sacramento MD Signature Date/Time: 04/25/2020/8:22:25 AM    Final     SIGNED: Deatra Nelligan, MD, FACP, FHM. [facility],  Pager (please use [email or web address] to page/text)  If 7PM-7AM, please contact night-coverage [email or web address] Ota [facility] 05/19/2020, 10:54 AM

## 2020-05-19 NOTE — ED Notes (Signed)
Pt in bed watching TV, call bell by right arm

## 2020-05-19 NOTE — ED Provider Notes (Signed)
[facility] Emergency Department Provider Note   ____________________________________________   First MD Initiated Contact with Corey West 05/19/20 0024     (approximate)  I have reviewed the triage vital signs and the nursing notes.   HISTORY  Chief Complaint Near Syncope    HPI Corey West is a 69 y.o. male sent to the ED by his PCP for near syncope and melena.  Corey West has a history of CKD stage IV, hypertension, CHF, diabetes, cirrhosis, Barrett's esophagus.  States Corey West has been receiving iron transfusions as well as blood transfusions from the cancer center but his blood counts remain low.  Denies anticoagulant use.  Reports several episodes of near syncope.  Denies fever, cough, chest pain, abdominal pain, nausea or vomiting.      Past Medical History:  Diagnosis Date  . Anemia   . Anxiety    controlled;   . Arthritis   . AVM (arteriovenous malformation) of stomach, acquired with hemorrhage   . Barrett's esophagus   . CHF (congestive heart failure) ([place])   . Chronic kidney disease    renal infufficiency  . Cirrhosis ([place])   . Depression    controlled;   Marland Kitchen Diabetes mellitus without complication ([place])    not controlled, taking insulin but sugar continues to run high;   . Edema   . Esophageal varices ([place])   . GAVE (gastric antral vascular ectasia)   . GERD (gastroesophageal reflux disease)   . History of hiatal hernia   . Hyperlipidemia   . Hypertension    controlled well;   . Liver cirrhosis ([place])   . Nephropathy, diabetic ([place])   . Obesity   . Pancytopenia ([place])   . Polyp, stomach    with chronic blood loss  . Sleep apnea    does not wear a cpap, Medicare would not pay for it   . Venous stasis dermatitis of both lower extremities   . Venous stasis of both lower extremities    with cellulitis    Corey West Active Problem List   Diagnosis Date Noted  . Symptomatic anemia 05/19/2020  . Acute blood loss anemia 05/19/2020  .  Bacteremia due to Klebsiella pneumoniae 04/26/2020  . Palliative care by specialist   . DNR (do not resuscitate) discussion   . COPD with chronic bronchitis ([place]) 04/23/2020  . Paroxysmal atrial fibrillation ([place]) 04/20/2020  . Cellulitis of left leg 02/23/2020  . Contusion of ribs, left, initial encounter 02/23/2020  . Acute hypoxemic respiratory failure ([place]) 02/04/2020  . Volume overload 01/28/2020  . Hyperlipidemia associated with type 2 diabetes mellitus ([place]) 01/28/2020  . Liver cirrhosis ([place])   . Anxiety   . SOB (shortness of breath)   . Elevated troponin   . CKD (chronic kidney disease) stage 4, GFR 15-29 ml/min (HCC)   . Unstageable pressure ulcer of sacral region ([place]) 11/28/2019  . Chronic diastolic CHF (congestive heart failure) ([place]) 11/27/2019  . Type II diabetes mellitus with renal manifestations ([place]) 11/27/2019  . Cellulitis of left lower extremity 11/27/2019  . Cellulitis of lower extremity 11/27/2019  . Cyst of pancreas 05/18/2019  . HCAP (healthcare-associated pneumonia) 02/03/2019  . Aphasia 01/14/2019  . Acute on chronic renal failure ([place]) 01/14/2019  . Malnutrition of moderate degree 12/03/2018  . C. difficile diarrhea 12/02/2018  . Ankle fracture, left, closed, initial encounter 10/15/2018  . Ankle fracture, right, closed, initial encounter 10/15/2018  . Ankle fracture 10/15/2018  . Pressure injury of skin 07/19/2018  . UTI (  urinary tract infection) 07/18/2018  . Acute kidney injury superimposed on CKD ([place]) 01/02/2018  . 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 Goals of care, counseling/discussion   . Palliative care encounter   . Acute hepatic encephalopathy 12/16/2017  . Hepatic encephalopathy ([place]) 10/18/2017  . Hypokalemia 10/09/2017  . Hyperglycemia 10/09/2017  . Sepsis ([place]) 06/12/2017  . Type 2 diabetes mellitus with diabetic nephropathy, with long-term current use of insulin ([place]) 05/29/2017  . Long term (current) use of insulin  ([place]) 05/29/2017  . Cellulitis in diabetic foot ([place]) 02/21/2017  . Cellulitis 02/21/2017  . Upper GI bleed/melena 07/20/2015  . Hematemesis 02/08/2015  . Cirrhosis ([place]) 02/08/2015  . Esophageal varices ([place]) 02/08/2015  . Gastroesophageal reflux disease 02/08/2015  . Hyperlipidemia 02/08/2015  . Chronic venous stasis dermatitis of both lower extremities 10/06/2014  . Cutaneous abscess of limb, unspecified 09/17/2014  . Edema 05/25/2014  . Hypertension 05/25/2014  . Obesity, Class III, BMI 40-49.9 (morbid obesity) ([place]) 05/25/2014  . Sleep apnea 05/25/2014  . Barrett's esophagus 05/25/2014  . Pancytopenia ([place]) 05/25/2014  . History of esophageal varices 05/25/2014  . Iron deficiency anemia 12/04/2013  . Thrombocytopenia ([place]) 12/04/2013  . Splenomegaly 11/04/2013    Past Surgical History:  Procedure Laterality Date  . ESOPHAGOGASTRODUODENOSCOPY N/A 02/09/2015   Procedure: ESOPHAGOGASTRODUODENOSCOPY (EGD);  Surgeon: Manya Silvas, MD;  Location: [facility] ENDOSCOPY;  Service: Endoscopy;  Laterality: N/A;  . ESOPHAGOGASTRODUODENOSCOPY N/A 07/22/2015   Procedure: ESOPHAGOGASTRODUODENOSCOPY (EGD);  Surgeon: Manya Silvas, MD;  Location: [facility] ENDOSCOPY;  Service: Endoscopy;  Laterality: N/A;  . ESOPHAGOGASTRODUODENOSCOPY N/A 06/26/2017   Procedure: ESOPHAGOGASTRODUODENOSCOPY (EGD);  Surgeon: Manya Silvas, MD;  Location: [facility] ENDOSCOPY;  Service: Endoscopy;  Laterality: N/A;  . ESOPHAGOGASTRODUODENOSCOPY N/A 06/27/2017   Procedure: ESOPHAGOGASTRODUODENOSCOPY (EGD);  Surgeon: Manya Silvas, MD;  Location: [facility] ENDOSCOPY;  Service: Endoscopy;  Laterality: N/A;  . ESOPHAGOGASTRODUODENOSCOPY N/A 06/28/2017   Procedure: ESOPHAGOGASTRODUODENOSCOPY (EGD);  Surgeon: Manya Silvas, MD;  Location: [facility] ENDOSCOPY;  Service: Endoscopy;  Laterality: N/A;  . ESOPHAGOGASTRODUODENOSCOPY Bilateral 10/11/2017   Procedure: ESOPHAGOGASTRODUODENOSCOPY (EGD);  Surgeon: Manya Silvas, MD;   Location: [facility] ENDOSCOPY;  Service: Endoscopy;  Laterality: Bilateral;  . ESOPHAGOGASTRODUODENOSCOPY N/A 12/04/2017   Procedure: ESOPHAGOGASTRODUODENOSCOPY (EGD);  Surgeon: Manya Silvas, MD;  Location: [facility] ENDOSCOPY;  Service: Endoscopy;  Laterality: N/A;  . ESOPHAGOGASTRODUODENOSCOPY N/A 03/06/2019   Procedure: ESOPHAGOGASTRODUODENOSCOPY (EGD);  Surgeon: Toledo, Benay Pike, MD;  Location: [facility] ENDOSCOPY;  Service: Gastroenterology;  Laterality: N/A;  . ESOPHAGOGASTRODUODENOSCOPY (EGD) WITH PROPOFOL N/A 07/20/2015   Procedure: ESOPHAGOGASTRODUODENOSCOPY (EGD) WITH PROPOFOL;  Surgeon: Manya Silvas, MD;  Location: [facility] ENDOSCOPY;  Service: Endoscopy;  Laterality: N/A;  . ESOPHAGOGASTRODUODENOSCOPY (EGD) WITH PROPOFOL N/A 09/16/2015   Procedure: ESOPHAGOGASTRODUODENOSCOPY (EGD) WITH PROPOFOL;  Surgeon: Manya Silvas, MD;  Location: [facility] ENDOSCOPY;  Service: Endoscopy;  Laterality: N/A;  . ESOPHAGOGASTRODUODENOSCOPY (EGD) WITH PROPOFOL N/A 03/16/2016   Procedure: ESOPHAGOGASTRODUODENOSCOPY (EGD) WITH PROPOFOL;  Surgeon: Manya Silvas, MD;  Location: [facility] ENDOSCOPY;  Service: Endoscopy;  Laterality: N/A;  . ESOPHAGOGASTRODUODENOSCOPY (EGD) WITH PROPOFOL N/A 09/14/2016   Procedure: ESOPHAGOGASTRODUODENOSCOPY (EGD) WITH PROPOFOL;  Surgeon: Manya Silvas, MD;  Location: [facility] ENDOSCOPY;  Service: Endoscopy;  Laterality: N/A;  . ESOPHAGOGASTRODUODENOSCOPY (EGD) WITH PROPOFOL N/A 11/05/2016   Procedure: ESOPHAGOGASTRODUODENOSCOPY (EGD) WITH PROPOFOL;  Surgeon: Manya Silvas, MD;  Location: [facility] ENDOSCOPY;  Service: Endoscopy;  Laterality: N/A;  . ESOPHAGOGASTRODUODENOSCOPY (EGD) WITH PROPOFOL N/A 02/06/2017   Procedure: ESOPHAGOGASTRODUODENOSCOPY (EGD) WITH PROPOFOL;  Surgeon: Manya Silvas,  MD;  Location: [facility] ENDOSCOPY;  Service: Endoscopy;  Laterality: N/A;  . ESOPHAGOGASTRODUODENOSCOPY (EGD) WITH PROPOFOL N/A 04/24/2017   Procedure: ESOPHAGOGASTRODUODENOSCOPY (EGD) WITH PROPOFOL;  Surgeon:  Manya Silvas, MD;  Location: [facility] ENDOSCOPY;  Service: Endoscopy;  Laterality: N/A;  . ESOPHAGOGASTRODUODENOSCOPY (EGD) WITH PROPOFOL N/A 07/31/2017   Procedure: ESOPHAGOGASTRODUODENOSCOPY (EGD) WITH PROPOFOL;  Surgeon: Manya Silvas, MD;  Location: [facility] ENDOSCOPY;  Service: Endoscopy;  Laterality: N/A;  . ESOPHAGOGASTRODUODENOSCOPY (EGD) WITH PROPOFOL N/A 08/08/2018   Procedure: ESOPHAGOGASTRODUODENOSCOPY (EGD) WITH PROPOFOL;  Surgeon: Manya Silvas, MD;  Location: [facility] ENDOSCOPY;  Service: Endoscopy;  Laterality: N/A;  . GIVENS CAPSULE STUDY N/A 12/05/2017   Procedure: GIVENS CAPSULE STUDY;  Surgeon: Manya Silvas, MD;  Location: [facility] ENDOSCOPY;  Service: Endoscopy;  Laterality: N/A;  . TONSILLECTOMY    . TONSILLECTOMY AND ADENOIDECTOMY    . ULNAR NERVE TRANSPOSITION    . UVULOPALATOPHARYNGOPLASTY      Prior to Admission medications   Medication Sig Start Date End Date Taking? Authorizing Provider  ferrous sulfate 325 (65 FE) MG tablet Take 1 tablet (325 mg total) by mouth daily. 05/04/20  Yes Nicole Kindred A, DO  hydrALAZINE (APRESOLINE) 50 MG tablet Take 1 tablet (50 mg total) by mouth 2 (two) times daily. 05/03/20  Yes Nicole Kindred A, DO  lactulose (CHRONULAC) 10 GM/15ML solution Take 90 mLs (60 g total) by mouth daily. Corey West taking differently: Take 45 g by mouth 2 (two) times daily.  05/04/20  Yes Nicole Kindred A, DO  LORazepam (ATIVAN) 0.5 MG tablet Take 1 tablet (0.5 mg total) by mouth 3 (three) times daily as needed for Anxiety   Yes [provider]  metolazone (ZAROXOLYN) 5 MG tablet Take 5 mg by mouth daily. 05/13/20  Yes [provider]  NOVOLOG MIX 70/30 FLEXPEN (70-30) 100 UNIT/ML FlexPen 80-84 Units as directed. Inject 84 Units Subcutaneous every morning at breakfast and 80 units at dinner 03/17/20  Yes [provider]  pantoprazole (PROTONIX) 40 MG tablet Take 1 tablet (40 mg total) by mouth 2 (two) times daily. 03/06/19 05/19/20 Yes  Dustin Flock, MD  potassium chloride (KLOR-CON) 10 MEQ tablet Take 10 mEq by mouth daily. 04/21/20  Yes [provider]  rifaximin (XIFAXAN) 550 MG TABS tablet Take 1 tablet (550 mg total) by mouth 2 (two) times daily. 02/12/20  Yes Wieting, Richard, MD  sodium chloride (OCEAN) 0.65 % SOLN nasal spray Place 1 spray into both nostrils as needed for congestion (nose irritation). 02/12/20  Yes Wieting, Richard, MD  torsemide (DEMADEX) 100 MG tablet Take 1 tablet (100 mg total) by mouth daily. 05/04/20  Yes Nicole Kindred A, DO  atorvastatin (LIPITOR) 20 MG tablet Take 20 mg by mouth daily. 03/23/20   [provider]  Ipratropium-Albuterol (COMBIVENT RESPIMAT) 20-100 MCG/ACT AERS respimat Inhale 1 puff into the lungs every 6 (six) hours. 02/12/20   Loletha Grayer, MD  tamsulosin (FLOMAX) 0.4 MG CAPS capsule Take 1 capsule (0.4 mg total) by mouth daily after supper. 05/03/20   Ezekiel Slocumb, DO    Allergies Corey West has no known allergies.  Family History  Problem Relation Age of Onset  . Diabetes Other   . Transient ischemic attack Father   . CAD Father     Social History Social History   Tobacco Use  . Smoking status: Former Smoker    Years: 20.00    Types: Cigars, Cigarettes  . Smokeless tobacco: Never Used  Vaping Use  . Vaping Use: Never  used  Substance Use Topics  . Alcohol use: No    Comment: stopped 15 years ago  . Drug use: No    Review of Systems  Constitutional: No fever/chills Eyes: No visual changes. ENT: No sore throat. Cardiovascular: Denies chest pain. Respiratory: Positive for shortness of breath. Gastrointestinal: Positive for black stools.  No abdominal pain.  No nausea, no vomiting.  No diarrhea.  No constipation. Genitourinary: Negative for dysuria. Musculoskeletal: Negative for back pain. Skin: Negative for rash. Neurological: Positive for near syncope.  Negative for headaches, focal weakness or  numbness.   ____________________________________________   PHYSICAL EXAM:  VITAL SIGNS: ED Triage Vitals  Enc Vitals Group     BP 05/18/20 1432 (!) 141/40     Pulse Rate 05/18/20 1432 78     Resp 05/18/20 1432 20     Temp 05/18/20 1432 98.2 F (36.8 C)     Temp Source 05/18/20 1432 Oral     SpO2 05/18/20 1432 98 %     Weight 05/18/20 1432 280 lb (127 kg)     Height 05/18/20 1432 5\' 9"  (1.753 m)     Head Circumference --      Peak Flow --      Pain Score 05/18/20 1450 0     Pain Loc --      Pain Edu? --      Excl. in [place]? --     Constitutional: Alert and oriented.  Elderly appearing and in mild acute distress. Eyes: Conjunctivae are normal. PERRL. EOMI. Head: Atraumatic. Nose: No congestion/rhinnorhea. Mouth/Throat: Mucous membranes are mildly dry.   Neck: No stridor.   Cardiovascular: Normal rate, irregular rhythm. Grossly normal heart sounds.  Good peripheral circulation. Respiratory: Normal respiratory effort.  No retractions. Lungs CTAB. Gastrointestinal: Soft and nontender to light or deep palpation. No distention. No abdominal bruits. No CVA tenderness. Rectal: Corey West had bowel movement which looked tarry and black in nature. Guaiac positive. Musculoskeletal: No lower extremity tenderness.  2+ BLE pitting edema.  No joint effusions. Neurologic:  Normal speech and language. No gross focal neurologic deficits are appreciated.  Skin:  Skin is warm, dry and intact. No rash noted. Psychiatric: Mood and affect are normal. Speech and behavior are normal.  ____________________________________________   LABS (all labs ordered are listed, but only abnormal results are displayed)  Labs Reviewed  BASIC METABOLIC PANEL - Abnormal; Notable for the following components:      Result Value   Potassium 2.6 (*)    Glucose, Bld 269 (*)    BUN 101 (*)    Anion gap 16 (*)    All other components within normal limits  CBC - Abnormal; Notable for the following components:   RBC  2.33 (*)    Hemoglobin 7.3 (*)    HCT 22.4 (*)    RDW 16.6 (*)    Platelets 88 (*)    All other components within normal limits  URINALYSIS, COMPLETE (UACMP) WITH MICROSCOPIC - Abnormal; Notable for the following components:   Color, Urine YELLOW (*)    APPearance CLEAR (*)    All other components within normal limits  AMMONIA - Abnormal; Notable for the following components:   Ammonia 147 (*)    All other components within normal limits  TROPONIN I (HIGH SENSITIVITY) - Abnormal; Notable for the following components:   Troponin I (High Sensitivity) 25 (*)    All other components within normal limits  SARS CORONAVIRUS 2 BY RT PCR (HOSPITAL ORDER, PERFORMED IN  [facility])  HEMOGLOBIN A1C  TYPE AND SCREEN  PREPARE RBC (CROSSMATCH)   ____________________________________________  EKG  ED ECG REPORT I, Kelley Knoth J, the attending physician, personally viewed and interpreted this ECG.   Date: 05/19/2020  EKG Time: 1445  Rate: 86  Rhythm: Atrial fibrillation  Axis: Unremarkable  Intervals:none  ST&T Change: Nonspecific  ____________________________________________  RADIOLOGY  ED MD interpretation: None  Official radiology report(s): No results found.  ____________________________________________   PROCEDURES  Procedure(s) performed (including Critical Care):  Procedures  Corey West was placed on cardiac monitor to evaluate for arrhythmias  CRITICAL CARE Performed by: Paulette Blanch   Total critical care time: 45 minutes  Critical care time was exclusive of separately billable procedures and treating other patients.  Critical care was necessary to treat or prevent imminent or life-threatening deterioration.  Critical care was time spent personally by me on the following activities: development of treatment plan with Corey West and/or surrogate as well as nursing, discussions with consultants, evaluation of Corey West's response to treatment, examination of  Corey West, obtaining history from Corey West or surrogate, ordering and performing treatments and interventions, ordering and review of laboratory studies, ordering and review of radiographic studies, pulse oximetry and re-evaluation of Corey West's condition.  ____________________________________________   INITIAL IMPRESSION / ASSESSMENT AND PLAN / ED COURSE  As part of my medical decision making, I reviewed the following data within the [place] notes reviewed and incorporated, Labs reviewed, EKG interpreted, Old chart reviewed, Radiograph reviewed, Discussed with admitting physician and Notes from prior ED visits     Corey West was evaluated in Emergency Department on 05/19/2020 for the symptoms described in the history of present illness. Corey West was evaluated in the context of the global COVID-19 pandemic, which necessitated consideration that the Corey West might be at risk for infection with the SARS-CoV-2 virus that causes COVID-19. Institutional protocols and algorithms that pertain to the evaluation of patients at risk for COVID-19 are in a state of rapid change based on information released by regulatory bodies including the CDC and federal and state organizations. These policies and algorithms were followed during the Corey West's care in the ED.    69 year old male presenting with near syncope.  Differential diagnosis includes but is not limited to ACS, GIB, infectious, metabolic etiologies, etc.  Laboratory results notable for H/H 7/22 which is stable for the past 2 weeks.  However, given Corey West's symptomatic anemia, will transfuse 1 unit PRBC.  Also notable is hypokalemia.  Will infuse 4 runs of IV potassium.  Mildly elevated troponin most likely stress leak from myocardial demand.  Will discuss case with hospitalist services for admission.   Clinical Course as of May 19 536  Thu May 19, 2020  0329 Ammonia noted to be elevated.  Will order lactulose.   [JS]     Clinical Course User Index [JS] Paulette Blanch, MD     ____________________________________________   FINAL CLINICAL IMPRESSION(S) / ED DIAGNOSES  Final diagnoses:  Near syncope  Hypokalemia  Weakness  Symptomatic anemia     ED Discharge Orders    None       Note:  This document was prepared using Dragon voice recognition software and may include unintentional dictation errors.   Paulette Blanch, MD 05/19/20 [phone]

## 2020-05-19 NOTE — ED Notes (Signed)
Pt experiencing dark black stools,  Cleaned of BM. Pt states he has hx of polyps and has regular blood infusions. Pt reports fluid build up in abdomen so no surgery. States he was sent here due to low hgb and near syncope

## 2020-05-19 NOTE — ED Notes (Signed)
Patient continues to have frequent smears of black, tarry stool. Incontinent of stool and urine, although can occasionally use urinal. VSS on RA as charted.   Patient is intermittently agitated, calling out for help and asking to get out of bed. Patient is able to stand at bedside and take a few steps with slow shuffling gait using personal walker.   Patient rec'd 2u PRBC today, tolerated well. Lasix given between units and after second unit as ordered. Potassium replaced.  Continues with IVF and protonix drip. Family updated via phone. Patient remains NPO for EGD.

## 2020-05-19 NOTE — ED Notes (Signed)
See paper chart for information from 0100 to now

## 2020-05-19 NOTE — H&P (Signed)
History and Physical    AVERI CACIOPPO TMH:[id number] DOB: 08/18/1951 DOA: 05/19/2020  PCP: Leonel Ramsay, MD   Patient coming from: Home  I have personally briefly reviewed patient's old medical records in [place]  Chief Complaint: Shortness of breath, hemoglobin of 6 at doctor's office  HPI: Corey West is a 69 y.o. 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 fib not on anticoagulation, insulin-dependent type 2 diabetes, CKD stage IV, hypertension, hyperlipidemia, chronic venous stasis dermatitis and edema bilateral lower extremities, anxiety, and obesity, OSA noncompliant with CPAP who presents to the ED for evaluation of progressive shortness of breath and lightheadedness with hemoglobin of 6 from doctor's office.  Patient has been having melena stool and was scheduled to follow-up with his GI on 8/19.  He denies abdominal pain, hematemesis.  Denies chest pain ED Course: On arrival patient hemodynamically stable.  Hemoglobin 7.3.  Other pertinent labs include potassium 2.6, ammonia 147.  Troponin 25.  Patient was typed and crossed and started on 1 unit PRBC.  Hospitalist consulted for admission.  Review of Systems: As per HPI otherwise all other systems on review of systems negative.    Past Medical History:  Diagnosis Date  . Anemia   . Anxiety    controlled;   . Arthritis   . AVM (arteriovenous malformation) of stomach, acquired with hemorrhage   . Barrett's esophagus   . CHF (congestive heart failure) ([place])   . Chronic kidney disease    renal infufficiency  . Cirrhosis ([place])   . Depression    controlled;   Marland Kitchen Diabetes mellitus without complication ([place])    not controlled, taking insulin but sugar continues to run high;   . Edema   . Esophageal varices ([place])   . GAVE (gastric antral vascular ectasia)   . GERD (gastroesophageal reflux  disease)   . History of hiatal hernia   . Hyperlipidemia   . Hypertension    controlled well;   . Liver cirrhosis ([place])   . Nephropathy, diabetic ([place])   . Obesity   . Pancytopenia ([place])   . Polyp, stomach    with chronic blood loss  . Sleep apnea    does not wear a cpap, Medicare would not pay for it   . Venous stasis dermatitis of both lower extremities   . Venous stasis of both lower extremities    with cellulitis    Past Surgical History:  Procedure Laterality Date  . ESOPHAGOGASTRODUODENOSCOPY N/A 02/09/2015   Procedure: ESOPHAGOGASTRODUODENOSCOPY (EGD);  Surgeon: Manya Silvas, MD;  Location: [facility] ENDOSCOPY;  Service: Endoscopy;  Laterality: N/A;  . ESOPHAGOGASTRODUODENOSCOPY N/A 07/22/2015   Procedure: ESOPHAGOGASTRODUODENOSCOPY (EGD);  Surgeon: Manya Silvas, MD;  Location: [facility] ENDOSCOPY;  Service: Endoscopy;  Laterality: N/A;  . ESOPHAGOGASTRODUODENOSCOPY N/A 06/26/2017   Procedure: ESOPHAGOGASTRODUODENOSCOPY (EGD);  Surgeon: Manya Silvas, MD;  Location: [facility] ENDOSCOPY;  Service: Endoscopy;  Laterality: N/A;  . ESOPHAGOGASTRODUODENOSCOPY N/A 06/27/2017   Procedure: ESOPHAGOGASTRODUODENOSCOPY (EGD);  Surgeon: Manya Silvas, MD;  Location: [facility] ENDOSCOPY;  Service: Endoscopy;  Laterality: N/A;  . ESOPHAGOGASTRODUODENOSCOPY N/A 06/28/2017   Procedure: ESOPHAGOGASTRODUODENOSCOPY (EGD);  Surgeon: Manya Silvas, MD;  Location: [facility] ENDOSCOPY;  Service: Endoscopy;  Laterality: N/A;  . ESOPHAGOGASTRODUODENOSCOPY Bilateral 10/11/2017   Procedure: ESOPHAGOGASTRODUODENOSCOPY (EGD);  Surgeon: Manya Silvas, MD;  Location: [facility] ENDOSCOPY;  Service: Endoscopy;  Laterality: Bilateral;  . ESOPHAGOGASTRODUODENOSCOPY N/A  12/04/2017   Procedure: ESOPHAGOGASTRODUODENOSCOPY (EGD);  Surgeon: Manya Silvas, MD;  Location: [facility] ENDOSCOPY;  Service: Endoscopy;  Laterality: N/A;  . ESOPHAGOGASTRODUODENOSCOPY N/A 03/06/2019   Procedure: ESOPHAGOGASTRODUODENOSCOPY (EGD);  Surgeon:  Toledo, Benay Pike, MD;  Location: [facility] ENDOSCOPY;  Service: Gastroenterology;  Laterality: N/A;  . ESOPHAGOGASTRODUODENOSCOPY (EGD) WITH PROPOFOL N/A 07/20/2015   Procedure: ESOPHAGOGASTRODUODENOSCOPY (EGD) WITH PROPOFOL;  Surgeon: Manya Silvas, MD;  Location: [facility] ENDOSCOPY;  Service: Endoscopy;  Laterality: N/A;  . ESOPHAGOGASTRODUODENOSCOPY (EGD) WITH PROPOFOL N/A 09/16/2015   Procedure: ESOPHAGOGASTRODUODENOSCOPY (EGD) WITH PROPOFOL;  Surgeon: Manya Silvas, MD;  Location: [facility] ENDOSCOPY;  Service: Endoscopy;  Laterality: N/A;  . ESOPHAGOGASTRODUODENOSCOPY (EGD) WITH PROPOFOL N/A 03/16/2016   Procedure: ESOPHAGOGASTRODUODENOSCOPY (EGD) WITH PROPOFOL;  Surgeon: Manya Silvas, MD;  Location: [facility] ENDOSCOPY;  Service: Endoscopy;  Laterality: N/A;  . ESOPHAGOGASTRODUODENOSCOPY (EGD) WITH PROPOFOL N/A 09/14/2016   Procedure: ESOPHAGOGASTRODUODENOSCOPY (EGD) WITH PROPOFOL;  Surgeon: Manya Silvas, MD;  Location: [facility] ENDOSCOPY;  Service: Endoscopy;  Laterality: N/A;  . ESOPHAGOGASTRODUODENOSCOPY (EGD) WITH PROPOFOL N/A 11/05/2016   Procedure: ESOPHAGOGASTRODUODENOSCOPY (EGD) WITH PROPOFOL;  Surgeon: Manya Silvas, MD;  Location: [facility] ENDOSCOPY;  Service: Endoscopy;  Laterality: N/A;  . ESOPHAGOGASTRODUODENOSCOPY (EGD) WITH PROPOFOL N/A 02/06/2017   Procedure: ESOPHAGOGASTRODUODENOSCOPY (EGD) WITH PROPOFOL;  Surgeon: Manya Silvas, MD;  Location: [facility] ENDOSCOPY;  Service: Endoscopy;  Laterality: N/A;  . ESOPHAGOGASTRODUODENOSCOPY (EGD) WITH PROPOFOL N/A 04/24/2017   Procedure: ESOPHAGOGASTRODUODENOSCOPY (EGD) WITH PROPOFOL;  Surgeon: Manya Silvas, MD;  Location: [facility] ENDOSCOPY;  Service: Endoscopy;  Laterality: N/A;  . ESOPHAGOGASTRODUODENOSCOPY (EGD) WITH PROPOFOL N/A 07/31/2017   Procedure: ESOPHAGOGASTRODUODENOSCOPY (EGD) WITH PROPOFOL;  Surgeon: Manya Silvas, MD;  Location: [facility] ENDOSCOPY;  Service: Endoscopy;  Laterality: N/A;  . ESOPHAGOGASTRODUODENOSCOPY (EGD) WITH  PROPOFOL N/A 08/08/2018   Procedure: ESOPHAGOGASTRODUODENOSCOPY (EGD) WITH PROPOFOL;  Surgeon: Manya Silvas, MD;  Location: [facility] ENDOSCOPY;  Service: Endoscopy;  Laterality: N/A;  . GIVENS CAPSULE STUDY N/A 12/05/2017   Procedure: GIVENS CAPSULE STUDY;  Surgeon: Manya Silvas, MD;  Location: [facility] ENDOSCOPY;  Service: Endoscopy;  Laterality: N/A;  . TONSILLECTOMY    . TONSILLECTOMY AND ADENOIDECTOMY    . ULNAR NERVE TRANSPOSITION    . UVULOPALATOPHARYNGOPLASTY       reports that he has quit smoking. His smoking use included cigars and cigarettes. He quit after 20.00 years of use. He has never used smokeless tobacco. He reports that he does not drink alcohol and does not use drugs.  No Known Allergies  Family History  Problem Relation Age of Onset  . Diabetes Other   . Transient ischemic attack Father   . CAD Father       Prior to Admission medications   Medication Sig Start Date End Date Taking? Authorizing Provider  ferrous sulfate 325 (65 FE) MG tablet Take 1 tablet (325 mg total) by mouth daily. 05/04/20  Yes Nicole Kindred A, DO  hydrALAZINE (APRESOLINE) 50 MG tablet Take 1 tablet (50 mg total) by mouth 2 (two) times daily. 05/03/20  Yes Nicole Kindred A, DO  lactulose (CHRONULAC) 10 GM/15ML solution Take 90 mLs (60 g total) by mouth daily. Patient taking differently: Take 45 g by mouth 2 (two) times daily.  05/04/20  Yes Nicole Kindred A, DO  LORazepam (ATIVAN) 0.5 MG tablet Take 1 tablet (0.5 mg total) by mouth 3 (three) times daily as needed for Anxiety   Yes [provider]  metolazone (ZAROXOLYN) 5 MG tablet Take 5 mg by mouth daily. 05/13/20  Yes [provider]  NOVOLOG MIX 70/30 FLEXPEN (70-30) 100 UNIT/ML FlexPen 80-84 Units as directed. Inject 84 Units Subcutaneous every morning at breakfast and 80 units at dinner 03/17/20  Yes [provider]  pantoprazole (PROTONIX) 40 MG tablet Take 1 tablet (40 mg total) by mouth 2 (two) times daily. 03/06/19  05/19/20 Yes Dustin Flock, MD  potassium chloride (KLOR-CON) 10 MEQ tablet Take 10 mEq by mouth daily. 04/21/20  Yes [provider]  rifaximin (XIFAXAN) 550 MG TABS tablet Take 1 tablet (550 mg total) by mouth 2 (two) times daily. 02/12/20  Yes Wieting, Richard, MD  sodium chloride (OCEAN) 0.65 % SOLN nasal spray Place 1 spray into both nostrils as needed for congestion (nose irritation). 02/12/20  Yes Wieting, Richard, MD  torsemide (DEMADEX) 100 MG tablet Take 1 tablet (100 mg total) by mouth daily. 05/04/20  Yes Nicole Kindred A, DO  atorvastatin (LIPITOR) 20 MG tablet Take 20 mg by mouth daily. 03/23/20   [provider]  Ipratropium-Albuterol (COMBIVENT RESPIMAT) 20-100 MCG/ACT AERS respimat Inhale 1 puff into the lungs every 6 (six) hours. 02/12/20   Loletha Grayer, MD  tamsulosin (FLOMAX) 0.4 MG CAPS capsule Take 1 capsule (0.4 mg total) by mouth daily after supper. 05/03/20   Ezekiel Slocumb, DO    Physical Exam: Vitals:   05/18/20 2339 05/19/20 0330 05/19/20 0400 05/19/20 0430  BP: (!) 148/50 (!) 141/56 (!) 127/52 (!) 134/59  Pulse: 75 70 73 72  Resp: 18 16 18 16   Temp: 98 F (36.7 C)     TempSrc:      SpO2: 97% 97% 98% 97%  Weight:      Height:         Vitals:   05/18/20 2339 05/19/20 0330 05/19/20 0400 05/19/20 0430  BP: (!) 148/50 (!) 141/56 (!) 127/52 (!) 134/59  Pulse: 75 70 73 72  Resp: 18 16 18 16   Temp: 98 F (36.7 C)     TempSrc:      SpO2: 97% 97% 98% 97%  Weight:      Height:          Constitutional: Alert and oriented x 3 . Not in any apparent distress HEENT:      Head: Normocephalic and atraumatic.         Eyes: PERLA, EOMI, Conjunctivae are normal. Sclera is non-icteric.       Mouth/Throat: Mucous membranes are moist.       Neck: Supple with no signs of meningismus. Cardiovascular: Regular rate and rhythm. No murmurs, gallops, or rubs. 2+ symmetrical distal pulses are present . No JVD. 2-3+LE edema Respiratory: Respiratory effort  normal .Lungs sounds clear bilaterally. No wheezes, crackles, or rhonchi.  Gastrointestinal: Soft, non tender, and non distended with positive bowel sounds. No rebound or guarding. Genitourinary: No CVA tenderness. Musculoskeletal: Nontender with normal range of motion in all extremities. No cyanosis, or erythema of extremities. Neurologic: Normal speech and language. Face is symmetric. Moving all extremities. No gross focal neurologic deficits . Skin: Skin is warm, dry.  No rash or ulcers Psychiatric: Mood and affect are normal Speech and behavior are normal   Labs on Admission: I have personally reviewed following labs and imaging studies  CBC: Recent Labs  Lab 05/18/20 1432  WBC 4.8  HGB 7.3*  HCT 22.4*  MCV 96.1  PLT 88*   Basic Metabolic Panel: Recent Labs  Lab 05/18/20 1432  NA 142  K 2.6*  CL 98  CO2 28  GLUCOSE 269*  BUN 101*  CREATININE 1.22  CALCIUM 8.9   GFR: Estimated Creatinine Clearance: 76.4 mL/min (by C-G formula based on SCr of 1.22 mg/dL). Liver Function Tests: No results for input(s): AST, ALT, ALKPHOS, BILITOT, PROT, ALBUMIN in the last 168 hours. No results for input(s): LIPASE, AMYLASE in the last 168 hours. Recent Labs  Lab 05/19/20 0120  AMMONIA 147*   Coagulation Profile: No results for input(s): INR, PROTIME in the last 168 hours. Cardiac Enzymes: No results for input(s): CKTOTAL, CKMB, CKMBINDEX, TROPONINI in the last 168 hours. BNP (last 3 results) No results for input(s): PROBNP in the last 8760 hours. HbA1C: No results for input(s): HGBA1C in the last 72 hours. CBG: No results for input(s): GLUCAP in the last 168 hours. Lipid Profile: No results for input(s): CHOL, HDL, LDLCALC, TRIG, CHOLHDL, LDLDIRECT in the last 72 hours. Thyroid Function Tests: No results for input(s): TSH, T4TOTAL, FREET4, T3FREE, THYROIDAB in the last 72 hours. Anemia Panel: No results for input(s): VITAMINB12, FOLATE, FERRITIN, TIBC, IRON, RETICCTPCT in  the last 72 hours. Urine analysis:    Component Value Date/Time   COLORURINE YELLOW (A) 05/18/2020 1432   APPEARANCEUR CLEAR (A) 05/18/2020 1432   APPEARANCEUR Clear 09/10/2014 0456   LABSPEC 1.009 05/18/2020 1432   LABSPEC 1.016 09/10/2014 0456   PHURINE 5.0 05/18/2020 1432   GLUCOSEU NEGATIVE 05/18/2020 1432   GLUCOSEU Negative 09/10/2014 0456   HGBUR NEGATIVE 05/18/2020 1432   BILIRUBINUR NEGATIVE 05/18/2020 1432   BILIRUBINUR Negative 09/10/2014 0456   KETONESUR NEGATIVE 05/18/2020 1432   PROTEINUR NEGATIVE 05/18/2020 1432   NITRITE NEGATIVE 05/18/2020 1432   LEUKOCYTESUR NEGATIVE 05/18/2020 1432   LEUKOCYTESUR Negative 09/10/2014 0456    Radiological Exams on Admission: No results found.  EKG: Independently reviewed. Interpretation : Normal sinus rhythm  Assessment/Plan 69 year old male with history of hepatic cirrhosis with esophageal varices and portal hypertension gastropathy, HFpEF, EF 60 to 65% March 2021, paroxysmal A. fib not on anticoagulation, insulin-dependent type 2 diabetes, CKD stage IV, hypertension,chronic venous stasis dermatitis, OSA and morbid obesity, presenting with lightheadedness, dyspnea on exertion melena stool.  Hemoglobin 6  Upper GI bleed/melena   Symptomatic anemia   Acute blood loss anemia   Esophageal varices (HCC)   GAVE (gastric antral vascular ectasia) -Patient with symptomatic anemia, lightheadedness and shortness of breath, hemoglobin 6 with melena stool in the setting of history of given esophageal varices -Received 1 unit PRBC in the emergency room -Serial H&H -GI consult.  Keeping n.p.o.    Chronic diastolic CHF (congestive heart failure) (HCC) -Not acutely exacerbated.  Continue home meds    Hypokalemia -Potassium 2.6.  Received IV repletion in ER -Continue to monitor  Mild acute hepatic encephalopathy (HCC) Hepatic cirrhosis -Symptoms of lightheadedness may be due in part to elevated ammonia level of 146 -Received  lactulose in the emergency room -Monitor neurologic status    Obesity, Class III, BMI 40-49.9 (morbid obesity) (HCC) -Complicating factor to overall prognosis and care    Type 2 diabetes mellitus with diabetic nephropathy, with long-term current use of insulin (HCC) -Sliding scale insulin coverage    DVT prophylaxis: SCDs Code Status: full code  Family Communication:  none  Disposition Plan: Back to previous home environment Consults called: GI Status:At the time of admission, it appears that the appropriate admission status for this patient is INPATIENT. This is judged to be reasonable and necessary in order to provide the required intensity of service to ensure the patient's safety given the presenting symptoms,  physical exam findings, and initial radiographic and laboratory data in the context of their  Comorbid conditions.   Patient requires inpatient status due to high intensity of service, high risk for further deterioration and high frequency of surveillance required.   I certify that at the point of admission it is my clinical judgment that the patient will require inpatient hospital care spanning beyond [place] MD [facility]     05/19/2020, 5:09 AM

## 2020-05-20 ENCOUNTER — Encounter: Payer: Self-pay | Admitting: Family Medicine

## 2020-05-20 ENCOUNTER — Ambulatory Visit: Payer: Medicare Other | Admitting: Family

## 2020-05-20 ENCOUNTER — Encounter: Payer: Self-pay | Admitting: Certified Registered"

## 2020-05-20 ENCOUNTER — Inpatient Hospital Stay: Payer: Medicare Other

## 2020-05-20 DIAGNOSIS — Z794 Long term (current) use of insulin: Secondary | ICD-10-CM

## 2020-05-20 DIAGNOSIS — E1121 Type 2 diabetes mellitus with diabetic nephropathy: Secondary | ICD-10-CM

## 2020-05-20 LAB — BASIC METABOLIC PANEL
Anion gap: 12 (ref 5–15)
Anion gap: 14 (ref 5–15)
BUN: 99 mg/dL — ABNORMAL HIGH (ref 8–23)
BUN: 98 mg/dL — ABNORMAL HIGH (ref 8–23)
CO2: 32 mmol/L (ref 22–32)
CO2: 28 mmol/L (ref 22–32)
Calcium: 9.1 mg/dL (ref 8.9–10.3)
Calcium: 8.6 mg/dL — ABNORMAL LOW (ref 8.9–10.3)
Chloride: 105 mmol/L (ref 98–111)
Chloride: 103 mmol/L (ref 98–111)
Creatinine, Ser: 2.9 mg/dL — ABNORMAL HIGH (ref 0.61–1.24)
Creatinine, Ser: 2.71 mg/dL — ABNORMAL HIGH (ref 0.61–1.24)
GFR calc Af Amer: 25 mL/min — ABNORMAL LOW (ref >60)
GFR calc Af Amer: 27 mL/min — ABNORMAL LOW (ref >60)
GFR calc non Af Amer: 21 mL/min — ABNORMAL LOW (ref >60)
GFR calc non Af Amer: 23 mL/min — ABNORMAL LOW (ref >60)
Glucose, Bld: 139 mg/dL — ABNORMAL HIGH (ref 70–99)
Glucose, Bld: 141 mg/dL — ABNORMAL HIGH (ref 70–99)
Potassium: 2.8 mmol/L — ABNORMAL LOW (ref 3.5–5.1)
Potassium: 2.7 mmol/L — CL (ref 3.5–5.1)
Sodium: 149 mmol/L — ABNORMAL HIGH (ref 135–145)
Sodium: 145 mmol/L (ref 135–145)

## 2020-05-20 LAB — GLUCOSE, CAPILLARY
Glucose-Capillary: 136 mg/dL — ABNORMAL HIGH (ref 70–99)
Glucose-Capillary: 135 mg/dL — ABNORMAL HIGH (ref 70–99)
Glucose-Capillary: 130 mg/dL — ABNORMAL HIGH (ref 70–99)
Glucose-Capillary: 123 mg/dL — ABNORMAL HIGH (ref 70–99)
Glucose-Capillary: 117 mg/dL — ABNORMAL HIGH (ref 70–99)
Glucose-Capillary: 125 mg/dL — ABNORMAL HIGH (ref 70–99)

## 2020-05-20 LAB — TYPE AND SCREEN
ABO/RH(D): A NEG
Antibody Screen: NEGATIVE
Unit division: 0
Unit division: 0

## 2020-05-20 LAB — CBC
HCT: 25.8 % — ABNORMAL LOW (ref 39.0–52.0)
Hemoglobin: 8.4 g/dL — ABNORMAL LOW (ref 13.0–17.0)
MCH: 31.1 pg (ref 26.0–34.0)
MCHC: 32.6 g/dL (ref 30.0–36.0)
MCV: 95.6 fL (ref 80.0–100.0)
Platelets: 84 10*3/uL — ABNORMAL LOW (ref 150–400)
RBC: 2.7 MIL/uL — ABNORMAL LOW (ref 4.22–5.81)
RDW: 17.5 % — ABNORMAL HIGH (ref 11.5–15.5)
WBC: 4.9 10*3/uL (ref 4.0–10.5)
nRBC: 0 % (ref 0.0–0.2)

## 2020-05-20 LAB — BPAM RBC
Blood Product Expiration Date: 202109012359
Blood Product Expiration Date: 202109062359
ISSUE DATE / TIME: 202108190553
ISSUE DATE / TIME: 202108191143
Unit Type and Rh: 600
Unit Type and Rh: 600

## 2020-05-20 LAB — POTASSIUM: Potassium: 2.9 mmol/L — ABNORMAL LOW (ref 3.5–5.1)

## 2020-05-20 LAB — AMMONIA: Ammonia: 58 umol/L — ABNORMAL HIGH (ref 9–35)

## 2020-05-20 MED ORDER — POTASSIUM CHLORIDE CRYS ER 20 MEQ PO TBCR
40.0000 meq | EXTENDED_RELEASE_TABLET | Freq: Once | ORAL | Status: AC
  Administered 2020-05-20: 40 meq via ORAL
  Filled 2020-05-20: qty 2

## 2020-05-20 MED ORDER — MAGNESIUM SULFATE 2 GM/50ML IV SOLN
2.0000 g | Freq: Once | INTRAVENOUS | Status: AC
  Administered 2020-05-20: 2 g via INTRAVENOUS
  Filled 2020-05-20: qty 50

## 2020-05-20 MED ORDER — POTASSIUM CHLORIDE 10 MEQ/100ML IV SOLN
10.0000 meq | INTRAVENOUS | Status: AC
  Administered 2020-05-20 (×4): 10 meq via INTRAVENOUS
  Filled 2020-05-20 (×2): qty 100

## 2020-05-20 MED ORDER — RIFAXIMIN 550 MG PO TABS
550.0000 mg | ORAL_TABLET | Freq: Three times a day (TID) | ORAL | Status: DC
  Administered 2020-05-20 – 2020-05-22 (×7): 550 mg via ORAL
  Filled 2020-05-20 (×7): qty 1

## 2020-05-20 MED ORDER — LACTULOSE 10 GM/15ML PO SOLN
20.0000 g | Freq: Two times a day (BID) | ORAL | Status: DC | PRN
  Administered 2020-05-21: 20 g via ORAL
  Filled 2020-05-20: qty 30

## 2020-05-20 MED ORDER — LACTULOSE ENEMA
300.0000 mL | Freq: Every day | ORAL | Status: DC
  Administered 2020-05-20: 300 mL via RECTAL
  Filled 2020-05-20 (×2): qty 300

## 2020-05-20 NOTE — Progress Notes (Signed)
PROGRESS NOTE    Patient: Corey West                            PCP: Leonel Ramsay, MD                    DOB: Aug 13, 1951            DOA: 05/19/2020 EHU:[id number]             DOS: 05/20/2020, 12:56 PM   LOS: 1 day   Date of Service: The patient was seen and examined on 05/20/2020  Subjective:   The patient was seen and examined this morning, alert and oriented x2 confused, agitated requesting his PCP. Yesterday he told me his PCP was retired and does not come to the hospital. He did not get his colonoscopy as his potassium was low. He was informed that he will receive supplement potassium today IV, anticipating for colonoscopy today.   Patient needs Ms. [name] was called -updated regarding patient's confusion, patient needing POA Plan of care was discussed... She also alluded that once his ammonia level is high he gets confused.   Brief Narrative:  Per HPI  Corey West is a 69 y.o. 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 fib not on anticoagulation,insulin-dependent type 2 diabetes, CKD stage IV, hypertension, hyperlipidemia, chronic venous stasis dermatitis and edema bilateral lower extremities, anxiety, and obesity, OSA noncompliant with CPAPwho presents to the ED for evaluation of progressive shortness of breath and lightheadedness with hemoglobin of 6 from doctor's office.   Patient has been having melena stool and was scheduled to follow-up with his GI on 8/19.   ED Course:  On arrival patient hemodynamically stable.  Hemoglobin 7.3.  Other pertinent labs include potassium 2.6, ammonia 147.  Troponin 25.  Patient was typed and crossed and started on 1 unit PRBC.  Hospitalist consulted for admission.   Assessment & Plan:   Active Problems:   Esophageal varices (HCC)   Upper GI bleed/melena   Hypokalemia   Hepatic encephalopathy (HCC)    GAVE (gastric antral vascular ectasia)   Obesity, Class III, BMI 40-49.9 (morbid obesity) (HCC)   Type 2 diabetes mellitus with diabetic nephropathy, with long-term current use of insulin (HCC)   Chronic diastolic CHF (congestive heart failure) (HCC)   Symptomatic anemia   Acute blood loss anemia   Acute GI bleeding     Upper GI bleed/melena / Symptomatic anemia  Acute blood loss anemia /  Esophageal varices (HCC)  H/o  GAVE (gastric antral vascular ectasia) -Status post 2  unit of PRBC transfusion 05/19/2020 -Hemoglobin 6.5, 6.7, 7.3 >>> 7.4 >> 8.4 Patient nursing staff reported no further rectal bleed or hematemesis  -Still complaining of dizziness generalized weakness, stating not feeling well anticipating transfusing another unit of PRBC today  -Patient with symptomatic anemia, lightheadedness and shortness of breath, hemoglobin was 6.5 on admission with melena stool in the setting of history of given esophageal varices  -GI consult.  Keeping n.p.o. -Recommended empiric antibiotics pending further evaluation -Anticipating GI to proceed with EGD this afternoon 05/20/2020  (procedure has been postponed from yesterday due to hypokalemia)  Metabolic encephalopathy -hepatic encephalopathy - h/o hepatic cirrhosis -Ammonia level on admission 146-> -Confused agitation alert oriented x2 -In the face of liver cirrhosis, hyperammonemia level, patient's lactulose has been on  help -We will check an ammonia level, pursuing with rectal enema lactulose Post EGD will initiate oral lactulose-Home dose  Chronic diastolic CHF (congestive heart failure) (HCC) -No signs of exacerbation, not complaining of shortness of breath -Lower extremity edema noted -Monitoring I's and O's, daily weight Not acutely exacerbated.  Continue home meds    Hypokalemia -Potassium 2.6. >>> 2.6  ...> 2.8  Continue to supplement potassium IV today 40 mEq -We will replete with additional supplement potassium IV  today We will check magnesium -Continue to monitor      Obesity, Class III, BMI 40-49.9 (morbid obesity) (HCC) -Complicating factor to overall prognosis and care    Type 2 diabetes mellitus with diabetic nephropathy, with long-term current use of insulin (HCC) -Withholding home medication -Checking blood sugar every 6 hours with SSI coverage while n.p.o. -Once tolerating p.o. we will switch CBG to QA CHS, with SSI coverage     Nutritional status:          Antimicrobials: 05/19/2020 empiric IV Abs ciprofloxacin> switch to Rocephin    Consultants: Gastroenterologist   --------------------------------------------------------------------------------------------------------------------  DVT prophylaxis:  SCD/Compression stockings Code Status:   Code Status: Full Code Family Communication: No family member present at bedside-  Ms. [name] was called -updated regarding patient's confusion, Also patient needing POA Plan of care was discussed  in detail,  expressed understanding and agreement of above. -Advance care planning has been discussed.   Admission status:    Status is: INP  The patient remains OBS appropriate and will d/c before 2 midnights.  Dispo: The patient is from:               Anticipated d/c is to: ? Home w HH vs  SNF               Anticipated d/c date is: 2 days              Patient currently is not medically stable to d/c.        Procedures:   No admission procedures for hospital encounter.   ?  EGD colonoscopy  Antimicrobials:  Anti-infectives (From admission, onward)   Start     Dose/Rate Route Frequency Ordered Stop   05/19/20 2000  cefTRIAXone (ROCEPHIN) 2 g in sodium chloride 0.9 % 100 mL IVPB        2 g 200 mL/hr over 30 Minutes Intravenous Every 24 hours 05/19/20 1757     05/19/20 1745  cefTRIAXone (ROCEPHIN) 1 g in sodium chloride 0.9 % 100 mL IVPB  Status:  Discontinued        1 g 200 mL/hr over 30 Minutes Intravenous  Every 24 hours 05/19/20 1738 05/19/20 1757   05/19/20 1100  ciprofloxacin (CIPRO) IVPB 400 mg  Status:  Discontinued        400 mg 200 mL/hr over 60 Minutes Intravenous Every 12 hours 05/19/20 1054 05/19/20 1738       Medication:  . sodium chloride   Intravenous Once  . insulin aspart  0-20 Units Subcutaneous Q4H  . lactulose  300 mL Rectal Daily    acetaminophen **OR** acetaminophen, hydrALAZINE, ondansetron **OR** ondansetron (ZOFRAN) IV   Objective:   Vitals:   05/19/20 1906 05/19/20 1936 05/19/20 2326 05/20/20 0835  BP: (!) 159/84 (!) 146/99 (!) 155/94 (!) 156/64  Pulse: 73 80 (!) 57 63  Resp: 19 19 20 18   Temp: 98.2 F (36.8 C) 98.5 F (36.9 C) 98.3 F (36.8 C) 98 F (36.7 C)  TempSrc: Oral Oral Oral Oral  SpO2: 99% 99% 100% 99%  Weight:      Height:        Intake/Output Summary (Last 24 hours) at 05/20/2020 1256 Last data filed at 05/20/2020 0300 Gross per 24 hour  Intake 2779.56 ml  Output --  Net 2779.56 ml   Filed Weights   05/18/20 1432  Weight: 127 kg     Examination:      Physical Exam:   General:  Alert, oriented,  x2, agitated, mildly confused ... but cooperative, no distress;   HEENT:  Normocephalic, PERRL, otherwise with in Normal limits   Neuro:  CNII-XII intact. , normal motor and sensation, reflexes intact   Lungs:   Clear to auscultation BL, Respirations unlabored, no wheezes / crackles  Cardio:    S1/S2, RRR, No murmure, No Rubs or Gallops   Abdomen:   Soft, non-tender, bowel sounds active all four quadrants,  no guarding or peritoneal signs.  Muscular skeletal:  Limited exam - in bed, able to move all 4 extremities, severe generalized weaknesses noted 2+ pulses,  symmetric, ++2 pitting edema  Skin:  Dry, warm to touch, negative for any Rashes, No open wounds  Wounds: Please see nursing documentation          ------------------------------------------------------------------------------------------------------------------------------------------    LABs:  CBC Latest Ref Rng & Units 05/20/2020 05/19/2020 05/19/2020  WBC 4.0 - 10.5 K/uL 4.9 4.6 4.2  Hemoglobin 13.0 - 17.0 g/dL 8.4(L) 7.7(L) 7.4(L)  Hematocrit 39 - 52 % 25.8(L) 23.3(L) 22.0(L)  Platelets 150 - 400 K/uL 84(L) 75(L) 82(L)   CMP Latest Ref Rng & Units 05/20/2020 05/19/2020 05/18/2020  Glucose 70 - 99 mg/dL 139(H) 193(H) 269(H)  BUN 8 - 23 mg/dL 99(H) 103(H) 101(H)  Creatinine 0.61 - 1.24 mg/dL 2.90(H) 2.92(H) 1.22  Sodium 135 - 145 mmol/L 149(H) 143 142  Potassium 3.5 - 5.1 mmol/L 2.8(L) 2.6(LL) 2.6(LL)  Chloride 98 - 111 mmol/L 105 100 98  CO2 22 - 32 mmol/L 32 30 28  Calcium 8.9 - 10.3 mg/dL 9.1 8.9 8.9  Total Protein 6.5 - 8.1 g/dL - 6.5 -  Total Bilirubin 0.3 - 1.2 mg/dL - 1.3(H) -  Alkaline Phos 38 - 126 U/L - 68 -  AST 15 - 41 U/L - 29 -  ALT 0 - 44 U/L - 15 -       Micro Results Recent Results (from the past 240 hour(s))  SARS Coronavirus 2 by RT PCR (hospital order, performed in [facility] hospital lab) Nasopharyngeal Nasopharyngeal Swab     Status: None   Collection Time: 05/19/20 12:10 AM   Specimen: Nasopharyngeal Swab  Result Value Ref Range Status   SARS Coronavirus 2 NEGATIVE NEGATIVE Final    Comment: (NOTE) SARS-CoV-2 target nucleic acids are NOT DETECTED.  The SARS-CoV-2 RNA is generally detectable in upper and lower respiratory specimens during the acute phase of infection. The lowest concentration of SARS-CoV-2 viral copies this assay can detect is 250 copies / mL. A negative result does not preclude SARS-CoV-2 infection and should not be used as the sole basis for treatment or other patient management decisions.  A negative result may occur with improper specimen collection / handling, submission of specimen other than nasopharyngeal swab, presence of viral mutation(s) within the areas targeted  by this assay, and inadequate number of viral copies (<250 copies / mL). A negative result must be combined with clinical observations, patient history, and epidemiological information.  Fact Sheet for Patients:  StrictlyIdeas.no  Fact Sheet for Healthcare Providers: BankingDealers.co.za  This test is not yet approved or  cleared by the [place] FDA and has been authorized for detection and/or diagnosis of SARS-CoV-2 by FDA under an Emergency Use Authorization (EUA).  This EUA will remain in effect (meaning this test can be used) for the duration of the COVID-19 declaration under Section 564(b)(1) of the Act, 21 U.S.C. section 360bbb-3(b)(1), unless the authorization is terminated or revoked sooner.  Performed at [facility], [address] [place], [place] [address]     Radiology Reports CT ABDOMEN PELVIS WO CONTRAST  Result Date: 04/23/2020 CLINICAL DATA:  69 year old male with history of abdominal pain and fever. EXAM: CT ABDOMEN AND PELVIS WITHOUT CONTRAST TECHNIQUE: Multidetector CT imaging of the abdomen and pelvis was performed following the standard protocol without IV contrast. COMPARISON:  No priors. FINDINGS: Lower chest: Trace left pleural effusion lying dependently. Diffuse low attenuation throughout the intravascular compartment, suggestive of anemia. Atherosclerotic calcifications in the left anterior descending, left circumflex and right coronary arteries. Calcifications of the aortic valve. Hepatobiliary: Liver has a shrunken appearance and nodular contour, indicative of underlying cirrhosis. Calcified granulomas are noted in the liver. No discrete cystic or solid hepatic lesions are other identified on today's noncontrast CT examination. Calcified gallstones are noted lying dependently in the gallbladder measuring up to 9 mm. Gallbladder is not distended. Pancreas: No definite pancreatic mass or peripancreatic  fluid collections or inflammatory changes are noted on today's noncontrast CT examination. Spleen: Spleen is mildly enlarged measuring 16.7 x 7.1 x 16.9 cm (estimated splenic volume of 1,002 mL) . Adrenals/Urinary Tract: There are no abnormal calcifications within the collecting system of either kidney, along the course of either ureter, or within the lumen of the urinary bladder. No hydroureteronephrosis or perinephric stranding to suggest urinary tract obstruction at this time. The unenhanced appearance of the kidneys is unremarkable bilaterally. Unenhanced appearance of the urinary bladder is normal. Bilateral adrenal glands are normal in appearance. Stomach/Bowel: Unenhanced appearance of the stomach is normal. No pathologic dilatation of small bowel or colon. Appendix is not confidently identified may be surgically absent. Vascular/Lymphatic: Aortic atherosclerosis. Numerous portosystemic collateral pathways, most notable for large splenorenal collateral vessels. No lymphadenopathy noted in the abdomen or pelvis. Reproductive: Prostate gland and seminal vesicles are unremarkable in appearance. Large right-sided hydrocele incompletely imaged. Other: Small umbilical hernia containing only omental fat. Small volume of ascites. No pneumoperitoneum. Musculoskeletal: There are no aggressive appearing lytic or blastic lesions noted in the visualized portions of the skeleton. IMPRESSION: 1. Morphologic changes in the liver compatible with cirrhosis. This is associated with evidence of portal venous hypertension, including splenomegaly and numerous portosystemic collateral vessels. 2. Cholelithiasis without evidence of acute cholecystitis at this time. Small umbilical hernia containing only omental fat large right-sided hydrocele incompletely imaged. 3. Trace left pleural effusion lying dependent 4. Diffuse low attenuation throughout the intravascular compartment, suggesting anemia. 5. Aortic atherosclerosis, in  addition to least 3 vessel coronary artery disease. Please note that although the presence of coronary artery calcium documents the presence of coronary artery disease, the severity of this disease and any potential stenosis cannot be assessed on this non-gated CT examination. Assessment for potential risk factor modification, dietary therapy or pharmacologic therapy may be warranted, if clinically indicated. 6. There are calcifications of the aortic valve. Echocardiographic correlation for evaluation of potential valvular dysfunction may be warranted if clinically indicated. 7. Additional incidental findings, as above. Electronically Signed   By: Quillian Quince  Entrikin M.D.   On: 04/23/2020 18:44   DG Chest Port 1 View  Result Date: 04/30/2020 CLINICAL DATA:  Shortness of breath. EXAM: PORTABLE CHEST 1 VIEW COMPARISON:  Radiograph 04/23/2020 FINDINGS: Stable cardiomegaly. Unchanged mediastinal contours. Vascular congestion which appears similar to prior exam. No acute or focal airspace disease. No pleural fluid or pneumothorax. IMPRESSION: Stable cardiomegaly and vascular congestion. No new abnormality. Electronically Signed   By: Keith Rake M.D.   On: 04/30/2020 16:46   DG Chest Port 1 View  Result Date: 04/23/2020 CLINICAL DATA:  Dyspnea, worsening shortness of breath and productive cough EXAM: PORTABLE CHEST 1 VIEW COMPARISON:  Radiograph 03/10/2020, CT 12/14/2019 FINDINGS: Chronically low lung volumes with some central vascular congestion similar to the comparison exam. No convincing features of frank edema at this time. No new focal areas of consolidation. No effusion or pneumothorax is visible. Mild cardiomegaly is similar to prior accounting for differences in technique. Telemetry leads overlie the chest. No acute osseous or soft tissue abnormality. Degenerative changes are present in the imaged spine and shoulders. Dextrocurvature of the thoracic spine similar to prior. IMPRESSION: Chronically low  lung volumes with some central vascular congestion. No convincing features of frank edema or other acute cardiopulmonary abnormality. Stable cardiomegaly. Electronically Signed   By: Lovena Le M.D.   On: 04/23/2020 15:18   ECHOCARDIOGRAM COMPLETE  Result Date: 04/25/2020    ECHOCARDIOGRAM REPORT   Patient Name:   Corey West Date of Exam: 04/24/2020 Medical Rec #:  [id number]    Height:       70.0 in Accession #:    [id number]   Weight:       304.0 lb Date of Birth:  Apr 02, 1951     BSA:          2.494 m Patient Age:    38 years     BP:           136/65 mmHg Patient Gender: M            HR:           63 bpm. Exam Location:  [facility] Procedure: 2D Echo, Cardiac Doppler and Color Doppler Indications:     CHF-Acute Diastolic 151.76 / H60.73  History:         Patient has prior history of Echocardiogram examinations. CHF;                  Risk Factors:Hypertension.  Sonographer:     Alyse Low Roar Referring Phys:  [id number] Athena Masse Diagnosing Phys: Kathlyn Sacramento MD IMPRESSIONS  1. Left ventricular ejection fraction, by estimation, is 50 to 55%. The left ventricle has low normal function. The left ventricle has no regional wall motion abnormalities. The left ventricular internal cavity size was mildly dilated. There is mild left ventricular hypertrophy. Left ventricular diastolic parameters are consistent with Grade II diastolic dysfunction (pseudonormalization).  2. Right ventricular systolic function is normal. The right ventricular size is normal. There is mildly elevated pulmonary artery systolic pressure.  3. Left atrial size was moderately dilated.  4. Right atrial size was mildly dilated.  5. The mitral valve is normal in structure. Trivial mitral valve regurgitation. No evidence of mitral stenosis.  6. The aortic valve is abnormal. Aortic valve regurgitation is not visualized. Mild to moderate aortic valve stenosis. Aortic valve area, by VTI measures 1.36 cm. Aortic valve mean gradient measures 14.3 mmHg.  7.  The inferior vena cava is dilated in size with <50% respiratory  variability, suggesting right atrial pressure of 15 mmHg. FINDINGS  Left Ventricle: Left ventricular ejection fraction, by estimation, is 50 to 55%. The left ventricle has low normal function. The left ventricle has no regional wall motion abnormalities. The left ventricular internal cavity size was mildly dilated. There is mild left ventricular hypertrophy. Left ventricular diastolic parameters are consistent with Grade II diastolic dysfunction (pseudonormalization). Right Ventricle: The right ventricular size is normal. No increase in right ventricular wall thickness. Right ventricular systolic function is normal. There is mildly elevated pulmonary artery systolic pressure. The tricuspid regurgitant velocity is 2.59  m/s, and with an assumed right atrial pressure of 15 mmHg, the estimated right ventricular systolic pressure is 57.8 mmHg. Left Atrium: Left atrial size was moderately dilated. Right Atrium: Right atrial size was mildly dilated. Pericardium: There is no evidence of pericardial effusion. Mitral Valve: The mitral valve is normal in structure. Normal mobility of the mitral valve leaflets. Moderate mitral annular calcification. Trivial mitral valve regurgitation. No evidence of mitral valve stenosis. Tricuspid Valve: The tricuspid valve is normal in structure. Tricuspid valve regurgitation is trivial. No evidence of tricuspid stenosis. Aortic Valve: The aortic valve is abnormal. . There is mild thickening and moderate calcification of the aortic valve. Aortic valve regurgitation is not visualized. Mild to moderate aortic stenosis is present. There is mild thickening of the aortic valve. There is moderate calcification of the aortic valve. Aortic valve mean gradient measures 14.3 mmHg. Aortic valve peak gradient measures 24.1 mmHg. Aortic valve area, by VTI measures 1.36 cm. Pulmonic Valve: The pulmonic valve was normal in structure. Pulmonic  valve regurgitation is not visualized. No evidence of pulmonic stenosis. Aorta: The aortic root is normal in size and structure. Venous: The inferior vena cava is dilated in size with less than 50% respiratory variability, suggesting right atrial pressure of 15 mmHg. IAS/Shunts: No atrial level shunt detected by color flow Doppler.  LEFT VENTRICLE PLAX 2D LVIDd:         5.46 cm  Diastology LVIDs:         4.09 cm  LV e' lateral:   8.27 cm/s LV PW:         1.22 cm  LV E/e' lateral: 17.9 LV IVS:        1.34 cm  LV e' medial:    8.05 cm/s LVOT diam:     1.70 cm  LV E/e' medial:  18.4 LV SV:         76 LV SV Index:   30 LVOT Area:     2.27 cm  RIGHT VENTRICLE RV Mid diam:    3.78 cm RV S prime:     13.70 cm/s TAPSE (M-mode): 2.2 cm LEFT ATRIUM              Index       RIGHT ATRIUM           Index LA diam:        4.60 cm  1.84 cm/m  RA Area:     22.10 cm LA Vol (A2C):   117.0 ml 46.92 ml/m RA Volume:   71.80 ml  28.79 ml/m LA Vol (A4C):   101.0 ml 40.50 ml/m LA Biplane Vol: 112.0 ml 44.91 ml/m  AORTIC VALVE                    PULMONIC VALVE AV Area (Vmax):    1.35 cm     PV Vmax:  1.45 m/s AV Area (Vmean):   1.39 cm     PV Peak grad:   8.4 mmHg AV Area (VTI):     1.36 cm     RVOT Peak grad: 5 mmHg AV Vmax:           245.50 cm/s AV Vmean:          178.333 cm/s AV VTI:            0.555 m AV Peak Grad:      24.1 mmHg AV Mean Grad:      14.3 mmHg LVOT Vmax:         146.00 cm/s LVOT Vmean:        109.000 cm/s LVOT VTI:          0.333 m LVOT/AV VTI ratio: 0.60  AORTA Ao Root diam: 3.00 cm MITRAL VALVE                TRICUSPID VALVE MV Area (PHT): 3.27 cm     TR Peak grad:   26.8 mmHg MV Decel Time: 232 msec     TR Vmax:        259.00 cm/s MV E velocity: 148.00 cm/s MV A velocity: 107.00 cm/s  SHUNTS MV E/A ratio:  1.38         Systemic VTI:  0.33 m MV A Prime:    11.4 cm/s    Systemic Diam: 1.70 cm Kathlyn Sacramento MD Electronically signed by Kathlyn Sacramento MD Signature Date/Time: 04/25/2020/8:22:25 AM    Final       SIGNED: Deatra Margerum, MD, FACP, FHM. [facility],  Pager (please use [email or web address] to page/text)  If 7PM-7AM, please contact night-coverage [email or web address] 05/20/2020, 12:56 PM

## 2020-05-20 NOTE — Plan of Care (Signed)
  Problem: Education: Goal: Knowledge of General Education information will improve Description: Including pain rating scale, medication(s)/side effects and non-pharmacologic comfort measures Outcome: Not Progressing   Problem: Health Behavior/Discharge Planning: Goal: Ability to manage health-related needs will improve Outcome: Not Progressing   Problem: Clinical Measurements: Goal: Ability to maintain clinical measurements within normal limits will improve Outcome: Progressing Goal: Will remain free from infection Outcome: Progressing Goal: Diagnostic test results will improve Outcome: Progressing Goal: Respiratory complications will improve Outcome: Progressing Goal: Cardiovascular complication will be avoided Outcome: Progressing   Problem: Nutrition: Goal: Adequate nutrition will be maintained Outcome: Progressing   Problem: Coping: Goal: Level of anxiety will decrease Outcome: Not Progressing   Problem: Elimination: Goal: Will not experience complications related to bowel motility Outcome: Progressing Goal: Will not experience complications related to urinary retention Outcome: Progressing   Problem: Elimination: Goal: Will not experience complications related to urinary retention Outcome: Progressing   Problem: Pain Managment: Goal: General experience of comfort will improve Outcome: Progressing   Problem: Safety: Goal: Ability to remain free from injury will improve Outcome: Progressing

## 2020-05-20 NOTE — Progress Notes (Signed)
Patient tearful given support , he was confused about the process of EGD. Patient request his PCP whom his niece " [name] " educated that he was retired. Patient fearful , provided reassurance to him . Patient compliant with potassium and magnesium administration . Notified Dr. Skip Mayer

## 2020-05-20 NOTE — Progress Notes (Signed)
GI note  Patient was scheduled for an upper endoscopy today but was canceled by anesthesia as the patient was encephalopathic and had a low potassium.  It was subsequently rechecked later today and unfortunately was still low.  Procedure canceled and rescheduled for tomorrow.  But I went to see the patient at around 2 PM he was wide-awake and there was no evidence of hepatic encephalopathy he is doing much better clinically  Dr Jonathon Bellows MD,MRCP [facility]) Gastroenterology/Hepatology Pager: [phone]

## 2020-05-20 NOTE — Progress Notes (Signed)
Lab phoned patients potassium level is 2.7 notified Dr. Roger Shelter

## 2020-05-21 ENCOUNTER — Inpatient Hospital Stay: Payer: Medicare Other | Admitting: Certified Registered"

## 2020-05-21 ENCOUNTER — Encounter: Payer: Self-pay | Admitting: Family Medicine

## 2020-05-21 ENCOUNTER — Encounter
Admission: EM | Disposition: A | Discharge status: Home Health | Payer: Self-pay | Source: Home / Self Care | Attending: Family Medicine

## 2020-05-21 DIAGNOSIS — E876 Hypokalemia: Secondary | ICD-10-CM

## 2020-05-21 DIAGNOSIS — K766 Portal hypertension: Secondary | ICD-10-CM

## 2020-05-21 DIAGNOSIS — K3189 Other diseases of stomach and duodenum: Secondary | ICD-10-CM

## 2020-05-21 DIAGNOSIS — K2270 Barrett's esophagus without dysplasia: Secondary | ICD-10-CM

## 2020-05-21 DIAGNOSIS — D649 Anemia, unspecified: Secondary | ICD-10-CM

## 2020-05-21 DIAGNOSIS — K31819 Angiodysplasia of stomach and duodenum without bleeding: Secondary | ICD-10-CM

## 2020-05-21 HISTORY — PX: ESOPHAGOGASTRODUODENOSCOPY (EGD) WITH PROPOFOL: SHX5813

## 2020-05-21 LAB — BASIC METABOLIC PANEL
Anion gap: 14 (ref 5–15)
BUN: 90 mg/dL — ABNORMAL HIGH (ref 8–23)
CO2: 27 mmol/L (ref 22–32)
Calcium: 8.6 mg/dL — ABNORMAL LOW (ref 8.9–10.3)
Chloride: 108 mmol/L (ref 98–111)
Creatinine, Ser: 2.6 mg/dL — ABNORMAL HIGH (ref 0.61–1.24)
GFR calc Af Amer: 28 mL/min — ABNORMAL LOW (ref >60)
GFR calc non Af Amer: 24 mL/min — ABNORMAL LOW (ref >60)
Glucose, Bld: 127 mg/dL — ABNORMAL HIGH (ref 70–99)
Potassium: 3.1 mmol/L — ABNORMAL LOW (ref 3.5–5.1)
Sodium: 149 mmol/L — ABNORMAL HIGH (ref 135–145)

## 2020-05-21 LAB — GLUCOSE, CAPILLARY
Glucose-Capillary: 117 mg/dL — ABNORMAL HIGH (ref 70–99)
Glucose-Capillary: 119 mg/dL — ABNORMAL HIGH (ref 70–99)
Glucose-Capillary: 120 mg/dL — ABNORMAL HIGH (ref 70–99)
Glucose-Capillary: 120 mg/dL — ABNORMAL HIGH (ref 70–99)
Glucose-Capillary: 126 mg/dL — ABNORMAL HIGH (ref 70–99)
Glucose-Capillary: 195 mg/dL — ABNORMAL HIGH (ref 70–99)
Glucose-Capillary: 203 mg/dL — ABNORMAL HIGH (ref 70–99)
Glucose-Capillary: 207 mg/dL — ABNORMAL HIGH (ref 70–99)

## 2020-05-21 LAB — CBC
HCT: 25.4 % — ABNORMAL LOW (ref 39.0–52.0)
Hemoglobin: 8 g/dL — ABNORMAL LOW (ref 13.0–17.0)
MCH: 31.1 pg (ref 26.0–34.0)
MCHC: 31.5 g/dL (ref 30.0–36.0)
MCV: 98.8 fL (ref 80.0–100.0)
Platelets: 66 10*3/uL — ABNORMAL LOW (ref 150–400)
RBC: 2.57 MIL/uL — ABNORMAL LOW (ref 4.22–5.81)
RDW: 17.4 % — ABNORMAL HIGH (ref 11.5–15.5)
WBC: 4 10*3/uL (ref 4.0–10.5)
nRBC: 0 % (ref 0.0–0.2)

## 2020-05-21 LAB — AMMONIA: Ammonia: 86 umol/L — ABNORMAL HIGH (ref 9–35)

## 2020-05-21 SURGERY — ESOPHAGOGASTRODUODENOSCOPY (EGD) WITH PROPOFOL
Anesthesia: General

## 2020-05-21 MED ORDER — LIDOCAINE HCL (CARDIAC) PF 100 MG/5ML IV SOSY
PREFILLED_SYRINGE | INTRAVENOUS | Status: DC | PRN
  Administered 2020-05-21: 50 mg via INTRAVENOUS

## 2020-05-21 MED ORDER — PANTOPRAZOLE SODIUM 40 MG IV SOLR
40.0000 mg | Freq: Two times a day (BID) | INTRAVENOUS | Status: DC
  Administered 2020-05-21 – 2020-05-23 (×4): 40 mg via INTRAVENOUS
  Filled 2020-05-21 (×4): qty 40

## 2020-05-21 MED ORDER — PROPOFOL 500 MG/50ML IV EMUL
INTRAVENOUS | Status: AC
  Filled 2020-05-21: qty 50

## 2020-05-21 MED ORDER — SODIUM CHLORIDE 0.9 % IV SOLN
50.0000 ug/h | INTRAVENOUS | Status: DC
  Administered 2020-05-21: 50 ug/h via INTRAVENOUS
  Filled 2020-05-21 (×5): qty 1

## 2020-05-21 MED ORDER — PROPOFOL 10 MG/ML IV BOLUS
INTRAVENOUS | Status: DC | PRN
  Administered 2020-05-21: 50 mg via INTRAVENOUS

## 2020-05-21 MED ORDER — POTASSIUM CHLORIDE IN NACL 20-0.45 MEQ/L-% IV SOLN
INTRAVENOUS | Status: DC
  Filled 2020-05-21 (×2): qty 1000

## 2020-05-21 MED ORDER — PROPOFOL 500 MG/50ML IV EMUL
INTRAVENOUS | Status: DC | PRN
  Administered 2020-05-21: 150 ug/kg/min via INTRAVENOUS

## 2020-05-21 MED ORDER — POTASSIUM CHLORIDE CRYS ER 20 MEQ PO TBCR
40.0000 meq | EXTENDED_RELEASE_TABLET | Freq: Two times a day (BID) | ORAL | Status: AC
  Administered 2020-05-21 (×2): 40 meq via ORAL
  Filled 2020-05-21 (×2): qty 2

## 2020-05-21 NOTE — Op Note (Addendum)
[facility] Patient Name: Corey West Procedure Date: 05/21/2020 10:26 AM MRN: [id number] Account #: [id number] Date of Birth: [birth year] Admit Type: Inpatient Age: 69 Room: [facility] ENDO ROOM 4 Gender: Male Note Status: Finalized Procedure:             Upper GI endoscopy Indications:           Iron deficiency anemia Providers:             Aylin Rhoads B. Bonna Gains MD, MD Medicines:             Monitored Anesthesia Care Complications:         No immediate complications. Procedure:             Pre-Anesthesia Assessment:                        - The risks and benefits of the procedure and the                         sedation options and risks were discussed with the                         patient. All questions were answered and informed                         consent was obtained.                        - Patient identification and proposed procedure were                         verified prior to the procedure.                        - ASA Grade Assessment: III - A patient with severe                         systemic disease.                        After obtaining informed consent, the endoscope was                         passed under direct vision. Throughout the procedure,                         the patient's blood pressure, pulse, and oxygen                         saturations were monitored continuously. The Endoscope                         was introduced through the mouth, and advanced to the                         second part of duodenum. The upper GI endoscopy was                         accomplished with ease. The patient tolerated the  procedure well. Findings:      Barrett's esophagus was present in the distal esophagus.      There is no endoscopic evidence of varices in the entire esophagus.      Moderate portal hypertensive gastropathy was found in the entire       examined stomach. Friable mucosa present in the  fundus, body and antrum.       The erythematous mucosa in the antrum was NOT present in a linear       fashion that would be expected of GAVE.      Patchy mild mucosal changes characterized by polypoid appearance were       found in the duodenal bulb. These wer not biopsied due to patient's       anemia.      The second portion of the duodenum was normal. Impression:            - Barrett's esophagus.                        - Portal hypertensive gastropathy.                        - Mucosal changes in the duodenum.                        - Normal second portion of the duodenum.                        - No specimens collected. Recommendation:        - Return patient to hospital ward for ongoing care.                        - Continue present medications.                        - The findings and recommendations were discussed with                         the patient.                        - Continue Serial CBCs and transfuse PRN                        - Continue PPI IV BID. Discontinue octreotide after                         total of 72 hours                        - If active GI bleeding occurs, transfer patient to                         tertiary care center for evaluation of TIPS. Pt should                         follow up with Dr. [name] within 2-3 weeks of dicharge                         and outpt referral for TIPS can be placed at that time.                        -  Repeat EGD for barrett's surveillance, and biopsies                         of polypoid mucosa in duodenum to be determined by Dr.                         [name] in clinic. Biopsies not obtained today due to                         anemia                        - Clear liquid diet today, then advance as tolerated                         to low sodium diet.                        - Beta blockers can be started as an outpt by primary                         GI if BP tolerates Procedure Code(s):     --- Professional  ---                        [id number], Esophagogastroduodenoscopy, flexible,                         transoral; diagnostic, including collection of                         specimen(s) by brushing or washing, when performed                         (separate procedure) Diagnosis Code(s):     --- Professional ---                        K22.70, Barrett's esophagus without dysplasia                        K76.6, Portal hypertension                        K31.89, Other diseases of stomach and duodenum                        D50.9, Iron deficiency anemia, unspecified CPT copyright 2019 American Medical Association. All rights reserved. The codes documented in this report are preliminary and upon coder review may  be revised to meet current compliance requirements.  Vonda Antigua, MD Margretta Sidle B. Bonna Gains MD, MD 05/21/2020 11:16:34 AM This report has been signed electronically. Number of Addenda: 0 Note Initiated On: 05/21/2020 10:26 AM Estimated Blood Loss:  Estimated blood loss: none.      [facility]

## 2020-05-21 NOTE — Transfer of Care (Signed)
Immediate Anesthesia Transfer of Care Note  Patient: [name]  Procedure(s) Performed: ESOPHAGOGASTRODUODENOSCOPY (EGD) WITH PROPOFOL (N/A )  Patient Location: Endoscopy Unit  Anesthesia Type:General  Level of Consciousness: awake, alert  and oriented  Airway & Oxygen Therapy: Patient Spontanous Breathing and Patient connected to face mask oxygen  Post-op Assessment: Report given to RN and Post -op Vital signs reviewed and stable  Post vital signs: Reviewed and stable  Last Vitals:  Vitals Value Taken Time  BP    Temp    Pulse    Resp    SpO2      Last Pain:  Vitals:   05/21/20 0800  TempSrc: Oral  PainSc:          Complications: No complications documented.

## 2020-05-21 NOTE — Progress Notes (Signed)
Vonda Antigua, MD [address], [place], [place], [place], [address] [address], [place], [place], [place], [address] Phone: [phone]  Fax: [phone]   Subjective:  Pt awake and alert. No confusion or active bleeding  Objective: Exam: Vital signs in last 24 hours: Vitals:   05/20/20 1614 05/20/20 1927 05/21/20 0143 05/21/20 0800  BP: 137/64 (!) 145/53 (!) 139/54 (!) 151/57  Pulse: 65 64 65 63  Resp: 18 16 18 18   Temp: 98.2 F (36.8 C) 97.8 F (36.6 C) 97.8 F (36.6 C) 98.1 F (36.7 C)  TempSrc: Oral Oral Oral Oral  SpO2: 97% 97% 98% 98%  Weight:      Height:       Weight change:   Intake/Output Summary (Last 24 hours) at 05/21/2020 1044 Last data filed at 05/20/2020 1500 Gross per 24 hour  Intake 828.89 ml  Output --  Net 828.89 ml    General: No acute distress, AAO x3 Abd: Soft, NT/ND, No HSM Skin: Warm, no rashes Neck: Supple, Trachea midline   Lab Results: Lab Results  Component Value Date   WBC 4.0 05/21/2020   HGB 8.0 (L) 05/21/2020   HCT 25.4 (L) 05/21/2020   MCV 98.8 05/21/2020   PLT 66 (L) 05/21/2020   Micro Results: Recent Results (from the past 240 hour(s))  SARS Coronavirus 2 by RT PCR (hospital order, performed in [place] hospital lab) Nasopharyngeal Nasopharyngeal Swab     Status: None   Collection Time: 05/19/20 12:10 AM   Specimen: Nasopharyngeal Swab  Result Value Ref Range Status   SARS Coronavirus 2 NEGATIVE NEGATIVE Final    Comment: (NOTE) SARS-CoV-2 target nucleic acids are NOT DETECTED.  The SARS-CoV-2 RNA is generally detectable in upper and lower respiratory specimens during the acute phase of infection. The lowest concentration of SARS-CoV-2 viral copies this assay can detect is 250 copies / mL. A negative result does not preclude SARS-CoV-2 infection and should not be used as the sole basis for treatment or other patient management decisions.  A negative result may occur with improper specimen  collection / handling, submission of specimen other than nasopharyngeal swab, presence of viral mutation(s) within the areas targeted by this assay, and inadequate number of viral copies (<250 copies / mL). A negative result must be combined with clinical observations, patient history, and epidemiological information.  Fact Sheet for Patients:   StrictlyIdeas.no  Fact Sheet for Healthcare Providers: BankingDealers.co.za  This test is not yet approved or  cleared by the [place] FDA and has been authorized for detection and/or diagnosis of SARS-CoV-2 by FDA under an Emergency Use Authorization (EUA).  This EUA will remain in effect (meaning this test can be used) for the duration of the COVID-19 declaration under Section 564(b)(1) of the Act, 21 U.S.C. section 360bbb-3(b)(1), unless the authorization is terminated or revoked sooner.  Performed at [facility], [address], [place], [place] [address]    Studies/Results: No results found. Medications:  Scheduled Meds: . sodium chloride   Intravenous Once  . insulin aspart  0-20 Units Subcutaneous Q4H  . lactulose  300 mL Rectal Daily  . pantoprazole (PROTONIX) IV  40 mg Intravenous Q12H  . potassium chloride  40 mEq Oral BID  . rifaximin  550 mg Oral TID   Continuous Infusions: . sodium chloride 75 mL/hr at 05/20/20 2017  . cefTRIAXone (ROCEPHIN)  IV 2 g (05/20/20 2018)  . octreotide  (SANDOSTATIN)    IV infusion  PRN Meds:.acetaminophen **OR** acetaminophen, hydrALAZINE, lactulose, ondansetron **OR** ondansetron (ZOFRAN) IV   Assessment: Active Problems:   Esophageal varices (HCC)   Upper GI bleed/melena   Hypokalemia   Hepatic encephalopathy (HCC)   GAVE (gastric antral vascular ectasia)   Obesity, Class III, BMI 40-49.9 (morbid obesity) (HCC)   Type 2 diabetes mellitus with diabetic nephropathy, with long-term current use of insulin (HCC)   Chronic  diastolic CHF (congestive heart failure) (HCC)   Symptomatic anemia   Acute blood loss anemia   Acute GI bleeding    Plan: EGD today for evaluation of anemia  I have discussed alternative options, risks & benefits,  which include, but are not limited to, bleeding, infection, perforation,respiratory complication & drug reaction.  The patient agrees with this plan & written consent will be obtained.    Potassium improved   LOS: 2 days   Vonda Antigua, MD 05/21/2020, 10:44 AM

## 2020-05-21 NOTE — Anesthesia Preprocedure Evaluation (Signed)
Anesthesia Evaluation  Patient identified by MRN, date of birth, ID band Patient awake    Reviewed: Allergy & Precautions, NPO status , Patient's Chart, lab work & pertinent test results  History of Anesthesia Complications Negative for: history of anesthetic complications  Airway Mallampati: III  TM Distance: >3 FB Neck ROM: Full    Dental  (+) Poor Dentition   Pulmonary sleep apnea , neg COPD, Current Smoker, former smoker,    breath sounds clear to auscultation- rhonchi (-) wheezing      Cardiovascular hypertension, +CHF  (-) CAD, (-) Past MI, (-) Cardiac Stents and (-) CABG  Rhythm:Regular Rate:Normal - Systolic murmurs and - Diastolic murmurs    Neuro/Psych neg Seizures PSYCHIATRIC DISORDERS Anxiety Depression negative neurological ROS     GI/Hepatic hiatal hernia, GERD  ,(+) Cirrhosis   Esophageal Varices    ,   Endo/Other  diabetes, Oral Hypoglycemic AgentsMorbid obesity  Renal/GU Renal InsufficiencyRenal disease  negative genitourinary   Musculoskeletal  (+) Arthritis ,   Abdominal (+) + obese,   Peds negative pediatric ROS (+)  Hematology  (+) anemia ,   Anesthesia Other Findings Past Medical History: No date: Anemia No date: Anxiety     Comment:  controlled;  No date: Arthritis No date: AVM (arteriovenous malformation) of stomach, acquired with  hemorrhage No date: Barrett's esophagus No date: Chronic kidney disease     Comment:  renal infufficiency No date: Cirrhosis ([place]) No date: Depression     Comment:  controlled;  No date: Diabetes mellitus without complication (HCC)     Comment:  not controlled, taking insulin but sugar continues to               run high;  No date: Edema No date: Esophageal varices (HCC) No date: GAVE (gastric antral vascular ectasia) No date: GERD (gastroesophageal reflux disease) No date: History of hiatal hernia No date: Hyperlipidemia No date: Hypertension      Comment:  controlled well;  No date: Liver cirrhosis (HCC) No date: Nephropathy, diabetic ([place]) No date: Obesity No date: Pancytopenia ([place]) No date: Polyp, stomach     Comment:  with chronic blood loss No date: Sleep apnea     Comment:  does not wear a cpap, Medicare would not pay for it  No date: Venous stasis dermatitis of both lower extremities No date: Venous stasis of both lower extremities     Comment:  with cellulitis   Reproductive/Obstetrics                             Anesthesia Physical  Anesthesia Plan  ASA: III  Anesthesia Plan: General   Post-op Pain Management:    Induction: Intravenous  PONV Risk Score and Plan: 0 and Propofol infusion  Airway Management Planned: Natural Airway and Nasal Cannula  Additional Equipment:   Intra-op Plan:   Post-operative Plan:   Informed Consent: I have reviewed the patients History and Physical, chart, labs and discussed the procedure including the risks, benefits and alternatives for the proposed anesthesia with the patient or authorized representative who has indicated his/her understanding and acceptance.     Dental advisory given  Plan Discussed with: CRNA and Anesthesiologist  Anesthesia Plan Comments: (Discussed possible intubation if bleeding varices are found)        Anesthesia Quick Evaluation

## 2020-05-21 NOTE — Progress Notes (Signed)
PROGRESS NOTE    Patient: Corey West                            PCP: Leonel Ramsay, MD                    DOB: Feb 16, 1951            DOA: 05/19/2020 [id number]:[id number]             DOS: 05/21/2020, 12:58 PM   LOS: 2 days   Date of Service: The patient was seen and examined on 05/21/2020  Subjective:   The patient was seen and examined this morning, status post EGD.  No active signs of bleeding was identified. Patient H&H has been stable Tolerated procedure Less confused today (monitoring ammonia level and potassium level) - on octreotide on IV Protonix   Brief Narrative:  Per HPI  Corey West is a 69 y.o. 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 fib not on anticoagulation,insulin-dependent type 2 diabetes, CKD stage IV, hypertension, hyperlipidemia, chronic venous stasis dermatitis and edema bilateral lower extremities, anxiety, and obesity, OSA noncompliant with CPAPwho presents to the ED for evaluation of progressive shortness of breath and lightheadedness with hemoglobin of 6 from doctor's office.   Patient has been having melena stool and was scheduled to follow-up with his GI on 8/19.   ED Course:  On arrival patient hemodynamically stable.  Hemoglobin 7.3.  Other pertinent labs include potassium 2.6, ammonia 147.  Troponin 25.  Patient was typed and crossed and started on 1 unit PRBC.  Hospitalist consulted for admission.   Assessment & Plan:   Active Problems:   Esophageal varices (HCC)   Upper GI bleed/melena   Hypokalemia   Hepatic encephalopathy (HCC)   GAVE (gastric antral vascular ectasia)   Obesity, Class III, BMI 40-49.9 (morbid obesity) (HCC)   Type 2 diabetes mellitus with diabetic nephropathy, with long-term current use of insulin (HCC)   Chronic diastolic CHF (congestive heart failure) (HCC)   Symptomatic anemia   Acute blood loss  anemia   Acute GI bleeding   Portal hypertensive gastropathy (HCC)     Upper GI bleed/melena / Symptomatic anemia  Acute blood loss anemia /  Esophageal varices (HCC)  H/o  GAVE (gastric antral vascular ectasia) -Patient remained stable no active bleeding reported to patient or nursing staff overnight. -Status post EGD 05/21/2020 -Findings: No active signs of bleeding, positive Barrett esophagus, hypertensive gastropathy, no finding consistent with GAVE, finding consistent with moderate portal hypertension, friable mucosa,  -GI recommended patient to be continued on octreotide for total of 72 hours, continue PPI If symptoms persist patient is to be transferred for TIPS procedure Patient is to follow with Dr. [name]  -Status post 2  unit of PRBC transfusion 05/19/2020 -Hemoglobin 6.5, 6.7, 7.3 >>> 7.4 >> 8.4 >> 8.0     -On admission patient with symptomatic anemia, lightheadedness and shortness of breath, hemoglobin was 6.5 on admission with melena stool in the setting of history of GAVE esophageal varices  --GI following, appreciate input -Recommended empiric antibiotics pending further evaluation -EGD for was postponed till 05/21/2020 due to hypokalemia/metabolic encephalopathy  Metabolic encephalopathy -hepatic encephalopathy - h/o hepatic cirrhosis -Ammonia level on admission 146-> 86 -Confused agitation alert oriented x2 -In the face of liver cirrhosis, hyperammonemia level, patient's lactulose has been  on help -Monitoring ammonia level, pursuing with rectal enema lactulose >> once tolerating p.o. we will proceed with p.o. lactulose   Chronic diastolic CHF (congestive heart failure) (HCC) -with severe portal hypertension -No signs of exacerbation, not complaining of shortness of breath -Lower extremity edema noted -Monitoring I's and O's, daily weight Not acutely exacerbated.  Continue home meds -Resume diuretics when can he tolerate    Hypokalemia -Potassium 2.6. >>>  2.6  ...> 2.8 >> 3.1 -Status post potassium replacement IV 820, will proceed with p.o. supplements today -Monitoring potassium and magnesium level and replete accordingly      Obesity, Class III, BMI 40-49.9 (morbid obesity) ([place]) -Complicating factor to overall prognosis and care    Type 2 diabetes mellitus with diabetic nephropathy, with long-term current use of insulin (HCC) -Withholding home medication -Checking blood sugar every 6 hours with SSI coverage while n.p.o. -Once tolerating p.o. we will switch CBG to QA CHS, with SSI coverage     Nutritional status:          Antimicrobials: 05/19/2020 empiric IV Abs ciprofloxacin> switch to Rocephin    Consultants: Gastroenterologist   --------------------------------------------------------------------------------------------------------------------  DVT prophylaxis:  SCD/Compression stockings Code Status:   Code Status: Full Code Family Communication: No family member present at bedside-  Ms. [name] was called -updated regarding patient's confusion, Also patient needing POA Plan of care was discussed  in detail,  expressed understanding and agreement of above. -Advance care planning has been discussed.   Admission status:    Status is: INP  The patient remains OBS appropriate and will d/c before 2 midnights.  Dispo: The patient is from:               Anticipated d/c is to: ? Home w [facility] vs  SNF               Anticipated d/c date is: Likely Monday              Patient currently is not medically stable to d/c.        Procedures:   No admission procedures for hospital encounter.   ?  EGD colonoscopy  Antimicrobials:  Anti-infectives (From admission, onward)   Start     Dose/Rate Route Frequency Ordered Stop   05/20/20 1500  rifaximin (XIFAXAN) tablet 550 mg        550 mg Oral 3 times daily 05/20/20 1432     05/19/20 2000  cefTRIAXone (ROCEPHIN) 2 g in sodium chloride 0.9 % 100 mL IVPB        2  g 200 mL/hr over 30 Minutes Intravenous Every 24 hours 05/19/20 1757     05/19/20 1745  cefTRIAXone (ROCEPHIN) 1 g in sodium chloride 0.9 % 100 mL IVPB  Status:  Discontinued        1 g 200 mL/hr over 30 Minutes Intravenous Every 24 hours 05/19/20 1738 05/19/20 1757   05/19/20 1100  ciprofloxacin (CIPRO) IVPB 400 mg  Status:  Discontinued        400 mg 200 mL/hr over 60 Minutes Intravenous Every 12 hours 05/19/20 1054 05/19/20 1738       Medication:  . sodium chloride   Intravenous Once  . insulin aspart  0-20 Units Subcutaneous Q4H  . lactulose  300 mL Rectal Daily  . pantoprazole (PROTONIX) IV  40 mg Intravenous Q12H  . potassium chloride  40 mEq Oral BID  . rifaximin  550 mg Oral TID    acetaminophen **OR** acetaminophen, hydrALAZINE,  lactulose, ondansetron **OR** ondansetron (ZOFRAN) IV   Objective:   Vitals:   05/21/20 1110 05/21/20 1120 05/21/20 1130 05/21/20 1159  BP: (!) 148/63 (!) 148/63 (!) 158/62 (!) 159/60  Pulse: 67 67 65 62  Resp: 17 16 18 18   Temp:    98.3 F (36.8 C)  TempSrc:    Oral  SpO2: 98% 96% 97% 97%  Weight:      Height:        Intake/Output Summary (Last 24 hours) at 05/21/2020 1258 Last data filed at 05/20/2020 1500 Gross per 24 hour  Intake 828.89 ml  Output --  Net 828.89 ml   Filed Weights   05/18/20 1432  Weight: 127 kg     Examination:       Physical Exam:   General:  Alert, oriented, cooperative, no distress;   HEENT:  Normocephalic, PERRL, otherwise with in Normal limits   Neuro:  CNII-XII intact. , normal motor and sensation, reflexes intact   Lungs:   Clear to auscultation BL, Respirations unlabored, no wheezes / crackles  Cardio:    S1/S2, RRR, No murmure, No Rubs or Gallops   Abdomen:   Soft, non-tender, bowel sounds active all four quadrants,  no guarding or peritoneal signs.  Muscular skeletal:   Severe generalized weaknesses Limited exam - in bed, able to move all 4 extremities,,  2+ pulses,  symmetric, ++2  pitting edema  Skin:  Dry, warm to touch, negative for any Rashes, No open wounds  Wounds: Please see nursing documentation -bilateral lower extremity erythema edema consistent with venous stasis            ------------------------------------------------------------------------------------------------------------------------------------------    LABs:  CBC Latest Ref Rng & Units 05/21/2020 05/20/2020 05/19/2020  WBC 4.0 - 10.5 K/uL 4.0 4.9 4.6  Hemoglobin 13.0 - 17.0 g/dL 8.0(L) 8.4(L) 7.7(L)  Hematocrit 39 - 52 % 25.4(L) 25.8(L) 23.3(L)  Platelets 150 - 400 K/uL 66(L) 84(L) 75(L)   CMP Latest Ref Rng & Units 05/21/2020 05/20/2020 05/20/2020  Glucose 70 - 99 mg/dL 127(H) - 141(H)  BUN 8 - 23 mg/dL 90(H) - 98(H)  Creatinine 0.61 - 1.24 mg/dL 2.60(H) - 2.71(H)  Sodium 135 - 145 mmol/L 149(H) - 145  Potassium 3.5 - 5.1 mmol/L 3.1(L) 2.9(L) 2.7(LL)  Chloride 98 - 111 mmol/L 108 - 103  CO2 22 - 32 mmol/L 27 - 28  Calcium 8.9 - 10.3 mg/dL 8.6(L) - 8.6(L)  Total Protein 6.5 - 8.1 g/dL - - -  Total Bilirubin 0.3 - 1.2 mg/dL - - -  Alkaline Phos 38 - 126 U/L - - -  AST 15 - 41 U/L - - -  ALT 0 - 44 U/L - - -       Micro Results Recent Results (from the past 240 hour(s))  SARS Coronavirus 2 by RT PCR (hospital order, performed in [facility] hospital lab) Nasopharyngeal Nasopharyngeal Swab     Status: None   Collection Time: 05/19/20 12:10 AM   Specimen: Nasopharyngeal Swab  Result Value Ref Range Status   SARS Coronavirus 2 NEGATIVE NEGATIVE Final    Comment: (NOTE) SARS-CoV-2 target nucleic acids are NOT DETECTED.  The SARS-CoV-2 RNA is generally detectable in upper and lower respiratory specimens during the acute phase of infection. The lowest concentration of SARS-CoV-2 viral copies this assay can detect is 250 copies / mL. A negative result does not preclude SARS-CoV-2 infection and should not be used as the sole basis for treatment or other patient management decisions.  A negative result may occur with improper specimen collection / handling, submission of specimen other than nasopharyngeal swab, presence of viral mutation(s) within the areas targeted by this assay, and inadequate number of viral copies (<250 copies / mL). A negative result must be combined with clinical observations, patient history, and epidemiological information.  Fact Sheet for Patients:   StrictlyIdeas.no  Fact Sheet for Healthcare Providers: BankingDealers.co.za  This test is not yet approved or  cleared by the [place] FDA and has been authorized for detection and/or diagnosis of SARS-CoV-2 by FDA under an Emergency Use Authorization (EUA).  This EUA will remain in effect (meaning this test can be used) for the duration of the COVID-19 declaration under Section 564(b)(1) of the Act, 21 U.S.C. section 360bbb-3(b)(1), unless the authorization is terminated or revoked sooner.  Performed at [facility], [address] [place], [place] [address]     Radiology Reports CT ABDOMEN PELVIS WO CONTRAST  Result Date: 04/23/2020 CLINICAL DATA:  69 year old male with history of abdominal pain and fever. EXAM: CT ABDOMEN AND PELVIS WITHOUT CONTRAST TECHNIQUE: Multidetector CT imaging of the abdomen and pelvis was performed following the standard protocol without IV contrast. COMPARISON:  No priors. FINDINGS: Lower chest: Trace left pleural effusion lying dependently. Diffuse low attenuation throughout the intravascular compartment, suggestive of anemia. Atherosclerotic calcifications in the left anterior descending, left circumflex and right coronary arteries. Calcifications of the aortic valve. Hepatobiliary: Liver has a shrunken appearance and nodular contour, indicative of underlying cirrhosis. Calcified granulomas are noted in the liver. No discrete cystic or solid hepatic lesions are other identified on today's noncontrast CT  examination. Calcified gallstones are noted lying dependently in the gallbladder measuring up to 9 mm. Gallbladder is not distended. Pancreas: No definite pancreatic mass or peripancreatic fluid collections or inflammatory changes are noted on today's noncontrast CT examination. Spleen: Spleen is mildly enlarged measuring 16.7 x 7.1 x 16.9 cm (estimated splenic volume of 1,002 mL) . Adrenals/Urinary Tract: There are no abnormal calcifications within the collecting system of either kidney, along the course of either ureter, or within the lumen of the urinary bladder. No hydroureteronephrosis or perinephric stranding to suggest urinary tract obstruction at this time. The unenhanced appearance of the kidneys is unremarkable bilaterally. Unenhanced appearance of the urinary bladder is normal. Bilateral adrenal glands are normal in appearance. Stomach/Bowel: Unenhanced appearance of the stomach is normal. No pathologic dilatation of small bowel or colon. Appendix is not confidently identified may be surgically absent. Vascular/Lymphatic: Aortic atherosclerosis. Numerous portosystemic collateral pathways, most notable for large splenorenal collateral vessels. No lymphadenopathy noted in the abdomen or pelvis. Reproductive: Prostate gland and seminal vesicles are unremarkable in appearance. Large right-sided hydrocele incompletely imaged. Other: Small umbilical hernia containing only omental fat. Small volume of ascites. No pneumoperitoneum. Musculoskeletal: There are no aggressive appearing lytic or blastic lesions noted in the visualized portions of the skeleton. IMPRESSION: 1. Morphologic changes in the liver compatible with cirrhosis. This is associated with evidence of portal venous hypertension, including splenomegaly and numerous portosystemic collateral vessels. 2. Cholelithiasis without evidence of acute cholecystitis at this time. Small umbilical hernia containing only omental fat large right-sided hydrocele  incompletely imaged. 3. Trace left pleural effusion lying dependent 4. Diffuse low attenuation throughout the intravascular compartment, suggesting anemia. 5. Aortic atherosclerosis, in addition to least 3 vessel coronary artery disease. Please note that although the presence of coronary artery calcium documents the presence of coronary artery disease, the severity of this disease and any potential stenosis  cannot be assessed on this non-gated CT examination. Assessment for potential risk factor modification, dietary therapy or pharmacologic therapy may be warranted, if clinically indicated. 6. There are calcifications of the aortic valve. Echocardiographic correlation for evaluation of potential valvular dysfunction may be warranted if clinically indicated. 7. Additional incidental findings, as above. Electronically Signed   By: Vinnie Langton M.D.   On: 04/23/2020 18:44   DG Chest Port 1 View  Result Date: 04/30/2020 CLINICAL DATA:  Shortness of breath. EXAM: PORTABLE CHEST 1 VIEW COMPARISON:  Radiograph 04/23/2020 FINDINGS: Stable cardiomegaly. Unchanged mediastinal contours. Vascular congestion which appears similar to prior exam. No acute or focal airspace disease. No pleural fluid or pneumothorax. IMPRESSION: Stable cardiomegaly and vascular congestion. No new abnormality. Electronically Signed   By: Keith Rake M.D.   On: 04/30/2020 16:46   DG Chest Port 1 View  Result Date: 04/23/2020 CLINICAL DATA:  Dyspnea, worsening shortness of breath and productive cough EXAM: PORTABLE CHEST 1 VIEW COMPARISON:  Radiograph 03/10/2020, CT 12/14/2019 FINDINGS: Chronically low lung volumes with some central vascular congestion similar to the comparison exam. No convincing features of frank edema at this time. No new focal areas of consolidation. No effusion or pneumothorax is visible. Mild cardiomegaly is similar to prior accounting for differences in technique. Telemetry leads overlie the chest. No acute  osseous or soft tissue abnormality. Degenerative changes are present in the imaged spine and shoulders. Dextrocurvature of the thoracic spine similar to prior. IMPRESSION: Chronically low lung volumes with some central vascular congestion. No convincing features of frank edema or other acute cardiopulmonary abnormality. Stable cardiomegaly. Electronically Signed   By: Lovena Le M.D.   On: 04/23/2020 15:18   ECHOCARDIOGRAM COMPLETE  Result Date: 04/25/2020    ECHOCARDIOGRAM REPORT   Patient Name:   Corey West Date of Exam: 04/24/2020 Medical Rec #:  [id number]    Height:       70.0 in Accession #:    [id number]   Weight:       304.0 lb Date of Birth:  05-22-1951     BSA:          2.494 m Patient Age:    31 years     BP:           136/65 mmHg Patient Gender: M            HR:           63 bpm. Exam Location:  [facility] Procedure: 2D Echo, Cardiac Doppler and Color Doppler Indications:     CHF-Acute Diastolic 099.83 / J82.50  History:         Patient has prior history of Echocardiogram examinations. CHF;                  Risk Factors:Hypertension.  Sonographer:     Alyse Low Roar Referring Phys:  [id number] Athena Masse Diagnosing Phys: Kathlyn Sacramento MD IMPRESSIONS  1. Left ventricular ejection fraction, by estimation, is 50 to 55%. The left ventricle has low normal function. The left ventricle has no regional wall motion abnormalities. The left ventricular internal cavity size was mildly dilated. There is mild left ventricular hypertrophy. Left ventricular diastolic parameters are consistent with Grade II diastolic dysfunction (pseudonormalization).  2. Right ventricular systolic function is normal. The right ventricular size is normal. There is mildly elevated pulmonary artery systolic pressure.  3. Left atrial size was moderately dilated.  4. Right atrial size was mildly dilated.  5. The mitral valve is  normal in structure. Trivial mitral valve regurgitation. No evidence of mitral stenosis.  6. The aortic valve is  abnormal. Aortic valve regurgitation is not visualized. Mild to moderate aortic valve stenosis. Aortic valve area, by VTI measures 1.36 cm. Aortic valve mean gradient measures 14.3 mmHg.  7. The inferior vena cava is dilated in size with <50% respiratory variability, suggesting right atrial pressure of 15 mmHg. FINDINGS  Left Ventricle: Left ventricular ejection fraction, by estimation, is 50 to 55%. The left ventricle has low normal function. The left ventricle has no regional wall motion abnormalities. The left ventricular internal cavity size was mildly dilated. There is mild left ventricular hypertrophy. Left ventricular diastolic parameters are consistent with Grade II diastolic dysfunction (pseudonormalization). Right Ventricle: The right ventricular size is normal. No increase in right ventricular wall thickness. Right ventricular systolic function is normal. There is mildly elevated pulmonary artery systolic pressure. The tricuspid regurgitant velocity is 2.59  m/s, and with an assumed right atrial pressure of 15 mmHg, the estimated right ventricular systolic pressure is 21.3 mmHg. Left Atrium: Left atrial size was moderately dilated. Right Atrium: Right atrial size was mildly dilated. Pericardium: There is no evidence of pericardial effusion. Mitral Valve: The mitral valve is normal in structure. Normal mobility of the mitral valve leaflets. Moderate mitral annular calcification. Trivial mitral valve regurgitation. No evidence of mitral valve stenosis. Tricuspid Valve: The tricuspid valve is normal in structure. Tricuspid valve regurgitation is trivial. No evidence of tricuspid stenosis. Aortic Valve: The aortic valve is abnormal. . There is mild thickening and moderate calcification of the aortic valve. Aortic valve regurgitation is not visualized. Mild to moderate aortic stenosis is present. There is mild thickening of the aortic valve. There is moderate calcification of the aortic valve. Aortic valve  mean gradient measures 14.3 mmHg. Aortic valve peak gradient measures 24.1 mmHg. Aortic valve area, by VTI measures 1.36 cm. Pulmonic Valve: The pulmonic valve was normal in structure. Pulmonic valve regurgitation is not visualized. No evidence of pulmonic stenosis. Aorta: The aortic root is normal in size and structure. Venous: The inferior vena cava is dilated in size with less than 50% respiratory variability, suggesting right atrial pressure of 15 mmHg. IAS/Shunts: No atrial level shunt detected by color flow Doppler.  LEFT VENTRICLE PLAX 2D LVIDd:         5.46 cm  Diastology LVIDs:         4.09 cm  LV e' lateral:   8.27 cm/s LV PW:         1.22 cm  LV E/e' lateral: 17.9 LV IVS:        1.34 cm  LV e' medial:    8.05 cm/s LVOT diam:     1.70 cm  LV E/e' medial:  18.4 LV SV:         76 LV SV Index:   30 LVOT Area:     2.27 cm  RIGHT VENTRICLE RV Mid diam:    3.78 cm RV S prime:     13.70 cm/s TAPSE (M-mode): 2.2 cm LEFT ATRIUM              Index       RIGHT ATRIUM           Index LA diam:        4.60 cm  1.84 cm/m  RA Area:     22.10 cm LA Vol (A2C):   117.0 ml 46.92 ml/m RA Volume:   71.80 ml  28.79 ml/m  LA Vol (A4C):   101.0 ml 40.50 ml/m LA Biplane Vol: 112.0 ml 44.91 ml/m  AORTIC VALVE                    PULMONIC VALVE AV Area (Vmax):    1.35 cm     PV Vmax:        1.45 m/s AV Area (Vmean):   1.39 cm     PV Peak grad:   8.4 mmHg AV Area (VTI):     1.36 cm     RVOT Peak grad: 5 mmHg AV Vmax:           245.50 cm/s AV Vmean:          178.333 cm/s AV VTI:            0.555 m AV Peak Grad:      24.1 mmHg AV Mean Grad:      14.3 mmHg LVOT Vmax:         146.00 cm/s LVOT Vmean:        109.000 cm/s LVOT VTI:          0.333 m LVOT/AV VTI ratio: 0.60  AORTA Ao Root diam: 3.00 cm MITRAL VALVE                TRICUSPID VALVE MV Area (PHT): 3.27 cm     TR Peak grad:   26.8 mmHg MV Decel Time: 232 msec     TR Vmax:        259.00 cm/s MV E velocity: 148.00 cm/s MV A velocity: 107.00 cm/s  SHUNTS MV E/A ratio:  1.38          Systemic VTI:  0.33 m MV A Prime:    11.4 cm/s    Systemic Diam: 1.70 cm Kathlyn Sacramento MD Electronically signed by Kathlyn Sacramento MD Signature Date/Time: 04/25/2020/8:22:25 AM    Final     SIGNED: Deatra Soileau, MD, FACP, FHM. [facility],  Pager (please use [email or web address] to page/text)  If 7PM-7AM, please contact night-coverage [email or web address] Ota [facility] 05/21/2020, 12:58 PM

## 2020-05-22 LAB — CBC
HCT: 26.4 % — ABNORMAL LOW (ref 39.0–52.0)
Hemoglobin: 8.7 g/dL — ABNORMAL LOW (ref 13.0–17.0)
MCH: 31.4 pg (ref 26.0–34.0)
MCHC: 33 g/dL (ref 30.0–36.0)
MCV: 95.3 fL (ref 80.0–100.0)
Platelets: 77 10*3/uL — ABNORMAL LOW (ref 150–400)
RBC: 2.77 MIL/uL — ABNORMAL LOW (ref 4.22–5.81)
RDW: 16.9 % — ABNORMAL HIGH (ref 11.5–15.5)
WBC: 4.5 10*3/uL (ref 4.0–10.5)
nRBC: 0 % (ref 0.0–0.2)

## 2020-05-22 LAB — BASIC METABOLIC PANEL
Anion gap: 9 (ref 5–15)
BUN: 81 mg/dL — ABNORMAL HIGH (ref 8–23)
CO2: 28 mmol/L (ref 22–32)
Calcium: 8.5 mg/dL — ABNORMAL LOW (ref 8.9–10.3)
Chloride: 105 mmol/L (ref 98–111)
Creatinine, Ser: 2.39 mg/dL — ABNORMAL HIGH (ref 0.61–1.24)
GFR calc Af Amer: 31 mL/min — ABNORMAL LOW (ref >60)
GFR calc non Af Amer: 27 mL/min — ABNORMAL LOW (ref >60)
Glucose, Bld: 88 mg/dL (ref 70–99)
Potassium: 3.9 mmol/L (ref 3.5–5.1)
Sodium: 142 mmol/L (ref 135–145)

## 2020-05-22 LAB — GLUCOSE, CAPILLARY
Glucose-Capillary: 139 mg/dL — ABNORMAL HIGH (ref 70–99)
Glucose-Capillary: 145 mg/dL — ABNORMAL HIGH (ref 70–99)
Glucose-Capillary: 156 mg/dL — ABNORMAL HIGH (ref 70–99)
Glucose-Capillary: 156 mg/dL — ABNORMAL HIGH (ref 70–99)
Glucose-Capillary: 176 mg/dL — ABNORMAL HIGH (ref 70–99)
Glucose-Capillary: 193 mg/dL — ABNORMAL HIGH (ref 70–99)
Glucose-Capillary: 64 mg/dL — ABNORMAL LOW (ref 70–99)
Glucose-Capillary: 89 mg/dL (ref 70–99)

## 2020-05-22 LAB — AMMONIA: Ammonia: 160 umol/L — ABNORMAL HIGH (ref 9–35)

## 2020-05-22 MED ORDER — TAMSULOSIN HCL 0.4 MG PO CAPS
0.4000 mg | ORAL_CAPSULE | Freq: Every day | ORAL | Status: DC
  Administered 2020-05-22: 0.4 mg via ORAL
  Filled 2020-05-22: qty 1

## 2020-05-22 MED ORDER — ATORVASTATIN CALCIUM 20 MG PO TABS
20.0000 mg | ORAL_TABLET | Freq: Every day | ORAL | Status: DC
  Administered 2020-05-22: 20 mg via ORAL
  Filled 2020-05-22: qty 1

## 2020-05-22 MED ORDER — LACTULOSE 10 GM/15ML PO SOLN
45.0000 g | Freq: Two times a day (BID) | ORAL | Status: DC
  Administered 2020-05-22: 45 g via ORAL
  Filled 2020-05-22 (×2): qty 90

## 2020-05-22 MED ORDER — INSULIN ASPART PROT & ASPART (70-30 MIX) 100 UNIT/ML PEN: 80.0000 [IU] | PEN_INJECTOR | SUBCUTANEOUS | Status: DC

## 2020-05-22 MED ORDER — METOLAZONE 5 MG PO TABS
5.0000 mg | ORAL_TABLET | Freq: Every day | ORAL | Status: DC
  Administered 2020-05-22: 5 mg via ORAL
  Filled 2020-05-22 (×2): qty 1

## 2020-05-22 MED ORDER — TORSEMIDE 20 MG PO TABS
100.0000 mg | ORAL_TABLET | Freq: Every day | ORAL | Status: DC
  Administered 2020-05-22: 100 mg via ORAL
  Filled 2020-05-22: qty 5

## 2020-05-22 MED ORDER — LORAZEPAM 0.5 MG PO TABS
0.5000 mg | ORAL_TABLET | Freq: Four times a day (QID) | ORAL | Status: DC | PRN
  Administered 2020-05-22 (×2): 0.5 mg via ORAL
  Filled 2020-05-22 (×2): qty 1

## 2020-05-22 MED ORDER — FERROUS SULFATE 325 (65 FE) MG PO TABS
325.0000 mg | ORAL_TABLET | Freq: Every day | ORAL | Status: DC
  Administered 2020-05-22: 325 mg via ORAL
  Filled 2020-05-22: qty 1

## 2020-05-22 MED ORDER — HYDRALAZINE HCL 50 MG PO TABS
50.0000 mg | ORAL_TABLET | Freq: Two times a day (BID) | ORAL | Status: DC
  Administered 2020-05-22 (×2): 50 mg via ORAL
  Filled 2020-05-22 (×2): qty 1

## 2020-05-22 NOTE — Progress Notes (Signed)
Pt is alert and oriented. Had 2 watery BM, on lactulose. He worked with PT this AM and this afternoon he refused to participate with OT and claimed he is independent. He asked help to make HCPOA, chaplain was called.

## 2020-05-22 NOTE — Progress Notes (Signed)
OT Cancellation Note  Patient Details Name: Corey West MRN: [id number] DOB: September 03, 1951   Cancelled Treatment:    Reason Eval/Treat Not Completed: Patient declined, no reason specified  Pt declines to participate in OT evaluation this date and states that he will not be doing occupational therapy on this hospitalization. OT makes multiple attempts to encourage pt. OT asks if the service can check on pt tomorrow and see if he feels up to it, and pt says no. States he is able to care for himself and does not need the service. OT reports this to RN and CNA to encourage pt complete self care at MOD I/baseline level to ensure safety in this respect prior to d/c. In addition pt tells this Pryor Curia that he would like to complete healthcare POA paperwork. OT relays this information to RN as well. Will complete OT order and sign off.   [name], [place], OTR/L ascom [phone] 05/22/20, 12:35 PM

## 2020-05-22 NOTE — Progress Notes (Signed)
PROGRESS NOTE    Patient: Corey West                            PCP: Leonel Ramsay, MD                    DOB: 1951-01-08            DOA: 05/19/2020 HFW:[id number]             DOS: 05/22/2020, 12:11 PM   LOS: 3 days   Date of Service: The patient was seen and examined on 05/22/2020  Subjective:   The patient was seen and examined this morning, mentation back to baseline alert and oriented x4 Tolerated EGD well.Sherald Hess clears now H&H remained stable - on octreotide on IV Protonix No further report of active bleeding rectally,   Brief Narrative:  Per HPI  Corey West is a 69 y.o. 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 fib not on anticoagulation,insulin-dependent type 2 diabetes, CKD stage IV, hypertension, hyperlipidemia, chronic venous stasis dermatitis and edema bilateral lower extremities, anxiety, and obesity, OSA noncompliant with CPAPwho presents to the ED for evaluation of progressive shortness of breath and lightheadedness with hemoglobin of 6 from doctor's office.   Patient has been having melena stool and was scheduled to follow-up with his GI on 8/19.   ED Course:  On arrival patient hemodynamically stable.  Hemoglobin 7.3.  Other pertinent labs include potassium 2.6, ammonia 147.  Troponin 25.  Patient was typed and crossed and started on 1 unit PRBC.  Hospitalist consulted for admission.   Assessment & Plan:   Active Problems:   Esophageal varices (HCC)   Upper GI bleed/melena   Hypokalemia   Hepatic encephalopathy (HCC)   GAVE (gastric antral vascular ectasia)   Obesity, Class III, BMI 40-49.9 (morbid obesity) (HCC)   Type 2 diabetes mellitus with diabetic nephropathy, with long-term current use of insulin (HCC)   Chronic diastolic CHF (congestive heart failure) (HCC)   Symptomatic anemia   Acute blood loss anemia   Acute GI  bleeding   Portal hypertensive gastropathy (HCC)     Upper GI bleed/melena / Symptomatic anemia  Acute blood loss anemia /  Esophageal varices (HCC)  H/o  GAVE (gastric antral vascular ectasia) -Remained stable overnight, no active bleeding, tolerating clears. -Status post EGD 05/21/2020 -tolerated well -Findings: No active signs of bleeding, positive Barrett esophagus, hypertensive gastropathy, no finding consistent with GAVE, finding consistent with moderate portal hypertension, friable mucosa,  -GI recommended patient to be continued on octreotide for total of 72 hours, continue PPI If symptoms persist patient is to be transferred for TIPS procedure Patient is to follow with Dr. [name]  -Status post 2  unit of PRBC transfusion 05/19/2020 -Hemoglobin 6.5, 6.7, 7.3 >>> 7.4 >> 8.4 >>  8.0,>> 8.7  -On admission patient with symptomatic anemia, lightheadedness and shortness of breath, hemoglobin was 6.5 on admission with melena stool in the setting of history of GAVE esophageal varices  -Discontinuing IV octreotide, continue IV Protonix.. We will switch to p.o. in a.m.  Metabolic encephalopathy -hepatic encephalopathy - h/o hepatic cirrhosis -Much improved back to baseline, alert oriented x4 -Ammonia level on admission 146-> 86 >>> 160 -Confused agitation alert oriented x2 -In the face of liver cirrhosis, hyperammonemia level, patient's lactulose has been on help -Monitoring ammonia  level, lactulose was given rectally -We will resume p.o. lactulose now ... Home dose   Chronic diastolic CHF (congestive heart failure) (HCC) -with severe portal hypertension -No signs of exacerbation or shortness of breath  -Lower extremity edema noted -Monitoring I's and O's, daily weight Not acutely exacerbated.  Continue home meds -Resume diuretics when can he tolerate -Deseeding IV fluids, resuming diuretics    Hypokalemia -Potassium 2.6. >>> 2.6  ...> 2.8 >> 3.1 >> 3.9 -Status post potassium  replacement IV 820, will proceed with p.o. supplements today -Monitoring potassium and  magnesium      Obesity, Class III, BMI 40-49.9 (morbid obesity) (HCC) -Complicating factor to overall prognosis and care -Stable    Type 2 diabetes mellitus with diabetic nephropathy, with long-term current use of insulin (HCC) -Resuming diet advancing from clear liquid to diabetic diet -We will resume home regimen --We will switch CBG to q. Dwight S, with SSI coverage      Nutritional status:          Antimicrobials: 05/19/2020 empiric IV Abs ciprofloxacin> switch to Rocephin    Consultants: Gastroenterologist   --------------------------------------------------------------------------------------------------------------------  DVT prophylaxis:  SCD/Compression stockings Code Status:   Code Status: Full Code Family Communication: No family member present at bedside-  Ms. [name] was called -updated regarding patient's confusion, Also patient needing POA Plan of care was discussed  in detail,  expressed understanding and agreement of above. -Advance care planning has been discussed.   Admission status:    Status is: INP  The patient remains OBS appropriate and will d/c before 2 midnights.  Dispo: The patient is from:               Anticipated d/c is to: ? Home w [facility] vs  SNF               Anticipated d/c date is: Likely Monday              Patient currently is not medically stable to d/c.        Procedures:   No admission procedures for hospital encounter.   ?  EGD colonoscopy  Antimicrobials:  Anti-infectives (From admission, onward)   Start     Dose/Rate Route Frequency Ordered Stop   05/20/20 1500  rifaximin (XIFAXAN) tablet 550 mg        550 mg Oral 3 times daily 05/20/20 1432     05/19/20 2000  cefTRIAXone (ROCEPHIN) 2 g in sodium chloride 0.9 % 100 mL IVPB        2 g 200 mL/hr over 30 Minutes Intravenous Every 24 hours 05/19/20 1757     05/19/20  1745  cefTRIAXone (ROCEPHIN) 1 g in sodium chloride 0.9 % 100 mL IVPB  Status:  Discontinued        1 g 200 mL/hr over 30 Minutes Intravenous Every 24 hours 05/19/20 1738 05/19/20 1757   05/19/20 1100  ciprofloxacin (CIPRO) IVPB 400 mg  Status:  Discontinued        400 mg 200 mL/hr over 60 Minutes Intravenous Every 12 hours 05/19/20 1054 05/19/20 1738       Medication:  . sodium chloride   Intravenous Once  . atorvastatin  20 mg Oral Daily  . ferrous sulfate  325 mg Oral Daily  . hydrALAZINE  50 mg Oral BID  . insulin aspart  0-20 Units Subcutaneous Q4H  . insulin aspart protamine - aspart  80-84 Units Subcutaneous UD  . lactulose  45 g Oral BID  .  metolazone  5 mg Oral Daily  . pantoprazole (PROTONIX) IV  40 mg Intravenous Q12H  . rifaximin  550 mg Oral TID  . tamsulosin  0.4 mg Oral QPC supper  . torsemide  100 mg Oral Daily    acetaminophen **OR** acetaminophen, hydrALAZINE, lactulose, LORazepam, ondansetron **OR** ondansetron (ZOFRAN) IV   Objective:   Vitals:   05/21/20 1343 05/21/20 1616 05/21/20 2310 05/22/20 0757  BP: (!) 151/50 (!) 145/60 (!) 150/58 (!) 146/56  Pulse: 62 60 64 (!) 53  Resp: 18 18 18 14   Temp: 98.2 F (36.8 C) 98.4 F (36.9 C) 98.4 F (36.9 C) 97.7 F (36.5 C)  TempSrc: Oral Oral Oral Oral  SpO2: 98% 95% 96% 99%  Weight:      Height:        Intake/Output Summary (Last 24 hours) at 05/22/2020 1211 Last data filed at 05/22/2020 1034 Gross per 24 hour  Intake 1510.04 ml  Output --  Net 1510.04 ml   Filed Weights   05/18/20 1432  Weight: 127 kg     Examination:      Physical Exam:   General:  Alert, oriented, cooperative, no distress;   HEENT:  Normocephalic, PERRL, otherwise with in Normal limits   Neuro:  CNII-XII intact. , normal motor and sensation, reflexes intact   Lungs:   Clear to auscultation BL, Respirations unlabored, no wheezes / crackles  Cardio:    S1/S2, RRR, No murmure, No Rubs or Gallops   Abdomen:   Soft,  non-tender, bowel sounds active all four quadrants,  no guarding or peritoneal signs.  Muscular skeletal:   Severe generalized weaknesses, Limited exam - in bed, able to move all 4 extremities, Normal strength,  2+ pulses,  symmetric, ++2 pitting edema  Skin:  Dry, warm to touch, negative for any Rashes, No open wounds   Wounds: Please see nursing documentation -bilateral lower extremity erythema edema consistent with venous stasis            ------------------------------------------------------------------------------------------------------------------------------------------    LABs:  CBC Latest Ref Rng & Units 05/22/2020 05/21/2020 05/20/2020  WBC 4.0 - 10.5 K/uL 4.5 4.0 4.9  Hemoglobin 13.0 - 17.0 g/dL 8.7(L) 8.0(L) 8.4(L)  Hematocrit 39 - 52 % 26.4(L) 25.4(L) 25.8(L)  Platelets 150 - 400 K/uL 77(L) 66(L) 84(L)   CMP Latest Ref Rng & Units 05/22/2020 05/21/2020 05/20/2020  Glucose 70 - 99 mg/dL 88 127(H) -  BUN 8 - 23 mg/dL 81(H) 90(H) -  Creatinine 0.61 - 1.24 mg/dL 2.39(H) 2.60(H) -  Sodium 135 - 145 mmol/L 142 149(H) -  Potassium 3.5 - 5.1 mmol/L 3.9 3.1(L) 2.9(L)  Chloride 98 - 111 mmol/L 105 108 -  CO2 22 - 32 mmol/L 28 27 -  Calcium 8.9 - 10.3 mg/dL 8.5(L) 8.6(L) -  Total Protein 6.5 - 8.1 g/dL - - -  Total Bilirubin 0.3 - 1.2 mg/dL - - -  Alkaline Phos 38 - 126 U/L - - -  AST 15 - 41 U/L - - -  ALT 0 - 44 U/L - - -       Micro Results Recent Results (from the past 240 hour(s))  SARS Coronavirus 2 by RT PCR (hospital order, performed in [facility] hospital lab) Nasopharyngeal Nasopharyngeal Swab     Status: None   Collection Time: 05/19/20 12:10 AM   Specimen: Nasopharyngeal Swab  Result Value Ref Range Status   SARS Coronavirus 2 NEGATIVE NEGATIVE Final    Comment: (NOTE) SARS-CoV-2 target nucleic acids are  NOT DETECTED.  The SARS-CoV-2 RNA is generally detectable in upper and lower respiratory specimens during the acute phase of infection. The  lowest concentration of SARS-CoV-2 viral copies this assay can detect is 250 copies / mL. A negative result does not preclude SARS-CoV-2 infection and should not be used as the sole basis for treatment or other patient management decisions.  A negative result may occur with improper specimen collection / handling, submission of specimen other than nasopharyngeal swab, presence of viral mutation(s) within the areas targeted by this assay, and inadequate number of viral copies (<250 copies / mL). A negative result must be combined with clinical observations, patient history, and epidemiological information.  Fact Sheet for Patients:   StrictlyIdeas.no  Fact Sheet for Healthcare Providers: BankingDealers.co.za  This test is not yet approved or  cleared by the [place] FDA and has been authorized for detection and/or diagnosis of SARS-CoV-2 by FDA under an Emergency Use Authorization (EUA).  This EUA will remain in effect (meaning this test can be used) for the duration of the COVID-19 declaration under Section 564(b)(1) of the Act, 21 U.S.C. section 360bbb-3(b)(1), unless the authorization is terminated or revoked sooner.  Performed at [facility], [address] [place],  [address]     Radiology Reports CT ABDOMEN PELVIS WO CONTRAST  Result Date: 04/23/2020 CLINICAL DATA:  69 year old male with history of abdominal pain and fever. EXAM: CT ABDOMEN AND PELVIS WITHOUT CONTRAST TECHNIQUE: Multidetector CT imaging of the abdomen and pelvis was performed following the standard protocol without IV contrast. COMPARISON:  No priors. FINDINGS: Lower chest: Trace left pleural effusion lying dependently. Diffuse low attenuation throughout the intravascular compartment, suggestive of anemia. Atherosclerotic calcifications in the left anterior descending, left circumflex and right coronary arteries. Calcifications of the aortic  valve. Hepatobiliary: Liver has a shrunken appearance and nodular contour, indicative of underlying cirrhosis. Calcified granulomas are noted in the liver. No discrete cystic or solid hepatic lesions are other identified on today's noncontrast CT examination. Calcified gallstones are noted lying dependently in the gallbladder measuring up to 9 mm. Gallbladder is not distended. Pancreas: No definite pancreatic mass or peripancreatic fluid collections or inflammatory changes are noted on today's noncontrast CT examination. Spleen: Spleen is mildly enlarged measuring 16.7 x 7.1 x 16.9 cm (estimated splenic volume of 1,002 mL) . Adrenals/Urinary Tract: There are no abnormal calcifications within the collecting system of either kidney, along the course of either ureter, or within the lumen of the urinary bladder. No hydroureteronephrosis or perinephric stranding to suggest urinary tract obstruction at this time. The unenhanced appearance of the kidneys is unremarkable bilaterally. Unenhanced appearance of the urinary bladder is normal. Bilateral adrenal glands are normal in appearance. Stomach/Bowel: Unenhanced appearance of the stomach is normal. No pathologic dilatation of small bowel or colon. Appendix is not confidently identified may be surgically absent. Vascular/Lymphatic: Aortic atherosclerosis. Numerous portosystemic collateral pathways, most notable for large splenorenal collateral vessels. No lymphadenopathy noted in the abdomen or pelvis. Reproductive: Prostate gland and seminal vesicles are unremarkable in appearance. Large right-sided hydrocele incompletely imaged. Other: Small umbilical hernia containing only omental fat. Small volume of ascites. No pneumoperitoneum. Musculoskeletal: There are no aggressive appearing lytic or blastic lesions noted in the visualized portions of the skeleton. IMPRESSION: 1. Morphologic changes in the liver compatible with cirrhosis. This is associated with evidence of portal  venous hypertension, including splenomegaly and numerous portosystemic collateral vessels. 2. Cholelithiasis without evidence of acute cholecystitis at this time. Small umbilical hernia containing  only omental fat large right-sided hydrocele incompletely imaged. 3. Trace left pleural effusion lying dependent 4. Diffuse low attenuation throughout the intravascular compartment, suggesting anemia. 5. Aortic atherosclerosis, in addition to least 3 vessel coronary artery disease. Please note that although the presence of coronary artery calcium documents the presence of coronary artery disease, the severity of this disease and any potential stenosis cannot be assessed on this non-gated CT examination. Assessment for potential risk factor modification, dietary therapy or pharmacologic therapy may be warranted, if clinically indicated. 6. There are calcifications of the aortic valve. Echocardiographic correlation for evaluation of potential valvular dysfunction may be warranted if clinically indicated. 7. Additional incidental findings, as above. Electronically Signed   By: Vinnie Langton M.D.   On: 04/23/2020 18:44   DG Chest Port 1 View  Result Date: 04/30/2020 CLINICAL DATA:  Shortness of breath. EXAM: PORTABLE CHEST 1 VIEW COMPARISON:  Radiograph 04/23/2020 FINDINGS: Stable cardiomegaly. Unchanged mediastinal contours. Vascular congestion which appears similar to prior exam. No acute or focal airspace disease. No pleural fluid or pneumothorax. IMPRESSION: Stable cardiomegaly and vascular congestion. No new abnormality. Electronically Signed   By: Keith Rake M.D.   On: 04/30/2020 16:46   DG Chest Port 1 View  Result Date: 04/23/2020 CLINICAL DATA:  Dyspnea, worsening shortness of breath and productive cough EXAM: PORTABLE CHEST 1 VIEW COMPARISON:  Radiograph 03/10/2020, CT 12/14/2019 FINDINGS: Chronically low lung volumes with some central vascular congestion similar to the comparison exam. No convincing  features of frank edema at this time. No new focal areas of consolidation. No effusion or pneumothorax is visible. Mild cardiomegaly is similar to prior accounting for differences in technique. Telemetry leads overlie the chest. No acute osseous or soft tissue abnormality. Degenerative changes are present in the imaged spine and shoulders. Dextrocurvature of the thoracic spine similar to prior. IMPRESSION: Chronically low lung volumes with some central vascular congestion. No convincing features of frank edema or other acute cardiopulmonary abnormality. Stable cardiomegaly. Electronically Signed   By: Lovena Le M.D.   On: 04/23/2020 15:18   ECHOCARDIOGRAM COMPLETE  Result Date: 04/25/2020    ECHOCARDIOGRAM REPORT   Patient Name:   Corey West Date of Exam: 04/24/2020 Medical Rec #:  [id number]    Height:       70.0 in Accession #:    [id number]   Weight:       304.0 lb Date of Birth:  [birth year]     BSA:          2.494 m Patient Age:    89 years     BP:           136/65 mmHg Patient Gender: M            HR:           63 bpm. Exam Location:  [facility] Procedure: 2D Echo, Cardiac Doppler and Color Doppler Indications:     CHF-Acute Diastolic 253.66 / Y40.34  History:         Patient has prior history of Echocardiogram examinations. CHF;                  Risk Factors:Hypertension.  Sonographer:     Alyse Low Roar Referring Phys:  [id number] Athena Masse Diagnosing Phys: Kathlyn Sacramento MD IMPRESSIONS  1. Left ventricular ejection fraction, by estimation, is 50 to 55%. The left ventricle has low normal function. The left ventricle has no regional wall motion abnormalities. The left ventricular internal cavity size was mildly  dilated. There is mild left ventricular hypertrophy. Left ventricular diastolic parameters are consistent with Grade II diastolic dysfunction (pseudonormalization).  2. Right ventricular systolic function is normal. The right ventricular size is normal. There is mildly elevated pulmonary artery systolic  pressure.  3. Left atrial size was moderately dilated.  4. Right atrial size was mildly dilated.  5. The mitral valve is normal in structure. Trivial mitral valve regurgitation. No evidence of mitral stenosis.  6. The aortic valve is abnormal. Aortic valve regurgitation is not visualized. Mild to moderate aortic valve stenosis. Aortic valve area, by VTI measures 1.36 cm. Aortic valve mean gradient measures 14.3 mmHg.  7. The inferior vena cava is dilated in size with <50% respiratory variability, suggesting right atrial pressure of 15 mmHg. FINDINGS  Left Ventricle: Left ventricular ejection fraction, by estimation, is 50 to 55%. The left ventricle has low normal function. The left ventricle has no regional wall motion abnormalities. The left ventricular internal cavity size was mildly dilated. There is mild left ventricular hypertrophy. Left ventricular diastolic parameters are consistent with Grade II diastolic dysfunction (pseudonormalization). Right Ventricle: The right ventricular size is normal. No increase in right ventricular wall thickness. Right ventricular systolic function is normal. There is mildly elevated pulmonary artery systolic pressure. The tricuspid regurgitant velocity is 2.59  m/s, and with an assumed right atrial pressure of 15 mmHg, the estimated right ventricular systolic pressure is 75.9 mmHg. Left Atrium: Left atrial size was moderately dilated. Right Atrium: Right atrial size was mildly dilated. Pericardium: There is no evidence of pericardial effusion. Mitral Valve: The mitral valve is normal in structure. Normal mobility of the mitral valve leaflets. Moderate mitral annular calcification. Trivial mitral valve regurgitation. No evidence of mitral valve stenosis. Tricuspid Valve: The tricuspid valve is normal in structure. Tricuspid valve regurgitation is trivial. No evidence of tricuspid stenosis. Aortic Valve: The aortic valve is abnormal. . There is mild thickening and moderate  calcification of the aortic valve. Aortic valve regurgitation is not visualized. Mild to moderate aortic stenosis is present. There is mild thickening of the aortic valve. There is moderate calcification of the aortic valve. Aortic valve mean gradient measures 14.3 mmHg. Aortic valve peak gradient measures 24.1 mmHg. Aortic valve area, by VTI measures 1.36 cm. Pulmonic Valve: The pulmonic valve was normal in structure. Pulmonic valve regurgitation is not visualized. No evidence of pulmonic stenosis. Aorta: The aortic root is normal in size and structure. Venous: The inferior vena cava is dilated in size with less than 50% respiratory variability, suggesting right atrial pressure of 15 mmHg. IAS/Shunts: No atrial level shunt detected by color flow Doppler.  LEFT VENTRICLE PLAX 2D LVIDd:         5.46 cm  Diastology LVIDs:         4.09 cm  LV e' lateral:   8.27 cm/s LV PW:         1.22 cm  LV E/e' lateral: 17.9 LV IVS:        1.34 cm  LV e' medial:    8.05 cm/s LVOT diam:     1.70 cm  LV E/e' medial:  18.4 LV SV:         76 LV SV Index:   30 LVOT Area:     2.27 cm  RIGHT VENTRICLE RV Mid diam:    3.78 cm RV S prime:     13.70 cm/s TAPSE (M-mode): 2.2 cm LEFT ATRIUM  Index       RIGHT ATRIUM           Index LA diam:        4.60 cm  1.84 cm/m  RA Area:     22.10 cm LA Vol (A2C):   117.0 ml 46.92 ml/m RA Volume:   71.80 ml  28.79 ml/m LA Vol (A4C):   101.0 ml 40.50 ml/m LA Biplane Vol: 112.0 ml 44.91 ml/m  AORTIC VALVE                    PULMONIC VALVE AV Area (Vmax):    1.35 cm     PV Vmax:        1.45 m/s AV Area (Vmean):   1.39 cm     PV Peak grad:   8.4 mmHg AV Area (VTI):     1.36 cm     RVOT Peak grad: 5 mmHg AV Vmax:           245.50 cm/s AV Vmean:          178.333 cm/s AV VTI:            0.555 m AV Peak Grad:      24.1 mmHg AV Mean Grad:      14.3 mmHg LVOT Vmax:         146.00 cm/s LVOT Vmean:        109.000 cm/s LVOT VTI:          0.333 m LVOT/AV VTI ratio: 0.60  AORTA Ao Root diam: 3.00  cm MITRAL VALVE                TRICUSPID VALVE MV Area (PHT): 3.27 cm     TR Peak grad:   26.8 mmHg MV Decel Time: 232 msec     TR Vmax:        259.00 cm/s MV E velocity: 148.00 cm/s MV A velocity: 107.00 cm/s  SHUNTS MV E/A ratio:  1.38         Systemic VTI:  0.33 m MV A Prime:    11.4 cm/s    Systemic Diam: 1.70 cm Kathlyn Sacramento MD Electronically signed by Kathlyn Sacramento MD Signature Date/Time: 04/25/2020/8:22:25 AM    Final     SIGNED: Deatra Mezera, MD, FACP, FHM. [facility],  Pager (please use [email or web address] to page/text)  If 7PM-7AM, please contact night-coverage [email or web address] Ota [facility] 05/22/2020, 12:10 PM

## 2020-05-22 NOTE — Progress Notes (Signed)
Physical Therapy Evaluation Patient Details Name: Corey West MRN: [id number] DOB: [birth year] Today's Date: 05/22/2020   History of Present Illness  Per MD note: Corey West is a 69 y.o. 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 fib not on anticoagulation, insulin-dependent type 2 diabetes, CKD stage IV, hypertension, hyperlipidemia, chronic venous stasis dermatitis and edema bilateral lower extremities, anxiety, and obesity, OSA noncompliant with CPAP who presents to the ED for evaluation of progressive shortness of breath and lightheadedness with hemoglobin of 6 from doctor's office.  Patient has been having melena stool and was scheduled to follow-up with his GI on 8/19.  He denies abdominal pain, hematemesis.  Denies chest pain  Clinical Impression  Patient agrees to PT evaluation. He has -3/5 hip flex strength, 3/5 knee extension strength BLE. He is MI with supine to sit bed mobility and needs mod assist for sit to supine bed mobility. He had good sitting balance and good standing balance with UE support . He transfers with MI sit to stand with RW and ambulates 100 feet with BRW and MI. He will continue to benefit from skilled PT to improve mobility and strength.     Follow Up Recommendations Home health PT    Equipment Recommendations  None recommended by PT    Recommendations for Other Services       Precautions / Restrictions Restrictions Weight Bearing Restrictions: No      Mobility  Bed Mobility Overal bed mobility: Needs Assistance Bed Mobility: Supine to Sit;Sit to Supine     Supine to sit: Modified independent (Device/Increase time) Sit to supine: Mod assist   General bed mobility comments: sleeps in a recliner chair aat home  Transfers Overall transfer level: Modified independent Equipment used: Rolling walker (2 wheeled) Transfers: Sit  to/from Stand Sit to Stand: Modified independent (Device/Increase time)         General transfer comment: wide base of support  Ambulation/Gait Ambulation/Gait assistance: Modified independent (Device/Increase time) Gait Distance (Feet): 100 Feet Assistive device: Rolling walker (2 wheeled) (BRW) Gait Pattern/deviations: Step-through pattern Gait velocity: decreased      Stairs            Wheelchair Mobility    Modified Rankin (Stroke Patients Only)       Balance Overall balance assessment: Modified Independent                                           Pertinent Vitals/Pain Pain Assessment: No/denies pain    Home Living Family/patient expects to be discharged to:: Private residence Living Arrangements: Alone Available Help at Discharge: Friend(s) Type of Home: House Home Access: Stairs to enter Entrance Stairs-Rails: Right Entrance Stairs-Number of Steps: 3   Home Equipment: Mettler - 2 wheels;Cane - single point      Prior Function Level of Independence: Independent with assistive device(s)   Gait / Transfers Assistance Needed: amb with spc or RW  ADL's / Homemaking Assistance Needed:  (had HHPT and HH nsg)        Hand Dominance        Extremity/Trunk Assessment   Upper Extremity Assessment Upper Extremity Assessment: Overall WFL for tasks assessed    Lower Extremity Assessment Lower Extremity Assessment: RLE deficits/detail;LLE deficits/detail RLE Deficits / Details: -3/5 hip flex, 3/5 knee  ext LLE Deficits / Details: -3/5 hip flex, 3/5 knee ext       Communication   Communication: No difficulties  Cognition Arousal/Alertness: Awake/alert Behavior During Therapy: WFL for tasks assessed/performed Overall Cognitive Status: Within Functional Limits for tasks assessed                                        General Comments General comments (skin integrity, edema, etc.):  (poor skin condition BLE below  knees)    Exercises     Assessment/Plan    PT Assessment Patient needs continued PT services  PT Problem List Decreased strength;Decreased activity tolerance;Obesity       PT Treatment Interventions Gait training;Functional mobility training;Therapeutic activities;Therapeutic exercise    PT Goals (Current goals can be found in the Care Plan section)  Acute Rehab PT Goals Patient Stated Goal: to go home PT Goal Formulation: With patient Time For Goal Achievement: 06/05/20 Potential to Achieve Goals: Good    Frequency Min 2X/week   Barriers to discharge Decreased caregiver support      Co-evaluation               AM-PAC PT "6 Clicks" Mobility  Outcome Measure Help needed turning from your back to your side while in a flat bed without using bedrails?: None Help needed moving from lying on your back to sitting on the side of a flat bed without using bedrails?: A Little Help needed moving to and from a bed to a chair (including a wheelchair)?: None Help needed standing up from a chair using your arms (e.g., wheelchair or bedside chair)?: None Help needed to walk in hospital room?: A Little Help needed climbing 3-5 steps with a railing? : A Little 6 Click Score: 21    End of Session Equipment Utilized During Treatment: Gait belt Activity Tolerance: Patient tolerated treatment well Patient left: in bed;with bed alarm set Nurse Communication: Mobility status PT Visit Diagnosis: Muscle weakness (generalized) (M62.81);Difficulty in walking, not elsewhere classified (R26.2)    Time: 2831-5176 PT Time Calculation (min) (ACUTE ONLY): 25 min   Charges:   PT Evaluation $PT Eval Low Complexity: 1 Low PT Treatments $Gait Training: 8-22 mins         Alanson Puls, PT DPT 05/22/2020, 9:38 AM

## 2020-05-22 NOTE — Progress Notes (Signed)
[place] visited with Pt in response to PG for AD. Pt interested in completing AD before discharge tomorrow morning. Ch gave AD education and helped complete Part A and Part B of AD. Ch will request morning chaplains to help notarize AD before discharge. Pt has assigned niece [name] as HPOA, and wants her to make all medical decisions. Pt requested [place] to speak with her over his mobile phone and get information, Ch did it. After phone call, Pt suddenly turned tearful and said that he wants to be saved. "I want to go to heaven when I die, can you help me with it?" Ch led Pt in prayer for salvation. Pt was grateful and sobbing about not having gone to church or baptized. Ch affirmed Pt to not be afraid. Pt made Ch call niece again to let her know that Pt is "saved." [name] was glad to hear it. Pt was grateful for visit.

## 2020-05-22 NOTE — Anesthesia Postprocedure Evaluation (Signed)
Anesthesia Post Note  Patient: Corey West  Procedure(s) Performed: ESOPHAGOGASTRODUODENOSCOPY (EGD) WITH PROPOFOL (N/A )  Patient location during evaluation: Endoscopy Anesthesia Type: General Level of consciousness: awake and alert and oriented Pain management: pain level controlled Vital Signs Assessment: post-procedure vital signs reviewed and stable Respiratory status: spontaneous breathing Cardiovascular status: blood pressure returned to baseline Anesthetic complications: no   No complications documented.   Last Vitals:  Vitals:   05/21/20 2310 05/22/20 0757  BP: (!) 150/58 (!) 146/56  Pulse: 64 (!) 53  Resp: 18 14  Temp: 36.9 C 36.5 C  SpO2: 96% 99%    Last Pain:  Vitals:   05/22/20 0757  TempSrc: Oral  PainSc: 0-No pain                 Yoshito Gaza

## 2020-05-22 NOTE — Progress Notes (Signed)
Ch attempted visit to discuss AD as per PG. Pt asleep at this time. Ch will try again later. Ch left AD packet in room.

## 2020-05-23 ENCOUNTER — Encounter: Payer: Self-pay | Admitting: Gastroenterology

## 2020-05-23 LAB — GLUCOSE, CAPILLARY
Glucose-Capillary: 109 mg/dL — ABNORMAL HIGH (ref 70–99)
Glucose-Capillary: 130 mg/dL — ABNORMAL HIGH (ref 70–99)
Glucose-Capillary: 94 mg/dL (ref 70–99)

## 2020-05-23 LAB — BASIC METABOLIC PANEL
Anion gap: 10 (ref 5–15)
BUN: 84 mg/dL — ABNORMAL HIGH (ref 8–23)
CO2: 29 mmol/L (ref 22–32)
Calcium: 8.8 mg/dL — ABNORMAL LOW (ref 8.9–10.3)
Chloride: 103 mmol/L (ref 98–111)
Creatinine, Ser: 2.82 mg/dL — ABNORMAL HIGH (ref 0.61–1.24)
GFR calc Af Amer: 25 mL/min — ABNORMAL LOW (ref >60)
GFR calc non Af Amer: 22 mL/min — ABNORMAL LOW (ref >60)
Glucose, Bld: 101 mg/dL — ABNORMAL HIGH (ref 70–99)
Potassium: 4.3 mmol/L (ref 3.5–5.1)
Sodium: 142 mmol/L (ref 135–145)

## 2020-05-23 LAB — CBC
HCT: 27 % — ABNORMAL LOW (ref 39.0–52.0)
Hemoglobin: 8.6 g/dL — ABNORMAL LOW (ref 13.0–17.0)
MCH: 31.4 pg (ref 26.0–34.0)
MCHC: 31.9 g/dL (ref 30.0–36.0)
MCV: 98.5 fL (ref 80.0–100.0)
Platelets: 76 10*3/uL — ABNORMAL LOW (ref 150–400)
RBC: 2.74 MIL/uL — ABNORMAL LOW (ref 4.22–5.81)
RDW: 16.6 % — ABNORMAL HIGH (ref 11.5–15.5)
WBC: 3.9 10*3/uL — ABNORMAL LOW (ref 4.0–10.5)
nRBC: 0 % (ref 0.0–0.2)

## 2020-05-23 LAB — AMMONIA: Ammonia: 35 umol/L (ref 9–35)

## 2020-05-23 MED ORDER — INSULIN ASPART PROT & ASPART (70-30 MIX) 100 UNIT/ML ~~LOC~~ SUSP: 84.0000 [IU] | Freq: Every day | SUBCUTANEOUS | Status: DC

## 2020-05-23 MED ORDER — INSULIN ASPART PROT & ASPART (70-30 MIX) 100 UNIT/ML ~~LOC~~ SUSP: 80.0000 [IU] | Freq: Every day | SUBCUTANEOUS | Status: DC

## 2020-05-23 NOTE — Discharge Summary (Signed)
Physician Discharge Summary [facility]    Patient: Corey West                   Admit date: 05/19/2020   DOB: 07/14/1951             Discharge date:05/23/2020/8:07 AM PYP:[id number]                          PCP: Leonel Ramsay, MD  Disposition: Home with Home Health   Recommendations for Outpatient Follow-up:   . Follow up: in 1 week  Discharge Condition: Stable   Code Status:   Code Status: Full Code  Diet recommendation: Cardiac diet   Discharge Diagnoses:    Active Problems:   Esophageal varices (HCC)   Upper GI bleed/melena   Hypokalemia   Hepatic encephalopathy (HCC)   GAVE (gastric antral vascular ectasia)   Obesity, Class III, BMI 40-49.9 (morbid obesity) ([place])   Type 2 diabetes mellitus with diabetic nephropathy, with long-term current use of insulin (HCC)   Chronic diastolic CHF (congestive heart failure) (HCC)   Symptomatic anemia   Acute blood loss anemia   Acute GI bleeding   Portal hypertensive gastropathy ([place])   History of Present Illness/ Hospital Course Kathleen Argue Summary:   Per HPI  Corey West a 69 y.o.malewith medical history significant forhepatic cirrhosis with esophageal varices and portal hypertension gastropathy,HFpEF, EF 60 to 65% March 2021, paroxysmal A. fib not on anticoagulation,insulin-dependent type 2 diabetes, CKD stage IV, hypertension, hyperlipidemia, chronic venous stasis dermatitis and edema bilateral lower extremities, anxiety, and obesity, OSA noncompliant with CPAPwho presents to the ED for evaluation of progressive shortness of breathand lightheadedness with hemoglobin of 6 from doctor's office.  Patient has been having melena stool and was scheduled to follow-up with his GI on 8/19.   ED Course: On arrival patient hemodynamically stable. Hemoglobin 7.3. Other pertinent labs include potassium 2.6, ammonia 147. Troponin 25. Patient was typed and crossed and started on 1 unit PRBC. Hospitalist  consulted for admission.    Upper GI bleed/melena / Symptomatic anemia Acute blood loss anemia / Esophageal varices (HCC)/portal hypertension H/o  ??GAVE (gastric antral vascular ectasia) -was not confirmed on this EGD -Remained stable overnight, no active bleeding, tolerating clears. -Status post EGD 05/21/2020 -tolerated well -Findings: No active signs of bleeding, positive Barrett esophagus, hypertensive gastropathy, no finding consistent with GAVE, finding consistent with moderate portal hypertension, friable mucosa,  -GI recommended patient to be continued on octreotide for total of 72 hours, continue PPI If symptoms persist patient is to be transferred for TIPS procedure Patient is to follow with Dr. [name]  -Status post 2  unit of PRBC transfusion 05/19/2020 -Hemoglobin 6.5, 6.7, 7.3 >>> 7.4 >> 8.4 >>  8.0,>> 8.7 >> 8.6  -On admission patient with symptomatic anemia, lightheadedness and shortness of breath, hemoglobin was 6.5 on admission with melena stool in the setting of history of GAVE esophageal varices  -Discontinuing IV octreotide, continue IV Protonix.. We will switch to p.o.   Metabolic encephalopathy -hepatic encephalopathy - h/o hepatic cirrhosis -Much improved back to baseline, alert oriented x4 -Ammonia level on admission 146-> 86 >>> 160 >>>> 35 -Confused agitation alert oriented x2 -In the face of liver cirrhosis, hyperammonemia level, patient's lactulose has been on help -Monitoring ammonia level, lactulose was given rectally -We will resume p.o. lactulose now ... Home dose   Chronic diastolic CHF (congestive heart failure) (HCC) -with severe portal hypertension -No  signs of exacerbation or shortness of breath  -Lower extremity edema noted -We monitor I's and O's and daily weight-closely Not acutely exacerbated. Continue home meds -Resume diuretics when can he tolerate -Deseeding IV fluids, resuming diuretics  Hypokalemia -Potassium 2.6.  >>> 2.6 ...> 2.8 >> 3.1 >> 3.9 >> 4.3 -Status post potassium replacement IV 820, will proceed with p.o. supplements today -Monitoring potassium and  magnesium    Obesity, Class III, BMI 40-49.9 (morbid obesity) (HCC) -Complicating factor to overall prognosis and care -Stable  Type 2 diabetes mellitus with diabetic nephropathy, with long-term current use of insulin (HCC) -Resume diabetic diet, resume home regimen insulin   Thrombocytopenia-chronic due to advance liver cirrhosis Platelets are stable no further signs of bleeding-no transfusion of platelets on this admission -Platelets 66, 77, 76 today  Antimicrobials: 05/19/2020 empiric IV Abs ciprofloxacin> switch to Rocephin    Consultants: Gastroenterologist   --------------------------------------------------------------------------------------------------------------------   Code Status:   Code Status: Full Code Family Communication: No family member present at bedside-  Ms. [name] was called -updated Also patient needing POA .   Admission status:   Status is: INP   Dispo: The patient is from:   Anticipated d/c is to: Home  Declined SNF  Discharge Instructions:   Discharge Instructions    (HEART FAILURE PATIENTS) Call MD:  Anytime you have any of the following symptoms: 1) 3 pound weight gain in 24 hours or 5 pounds in 1 week 2) shortness of breath, with or without a dry hacking cough 3) swelling in the hands, feet or stomach 4) if you have to sleep on extra pillows at night in order to breathe.   Complete by: As directed    Activity as tolerated - No restrictions   Complete by: As directed    Call MD for:  persistant dizziness or light-headedness   Complete by: As directed    Call MD for:  persistant nausea and vomiting   Complete by: As directed    Diet - low sodium heart healthy   Complete by: As directed    Discharge instructions   Complete by: As directed    Please  follow-up with PCP, gastroenterology as an outpatient, monitor your stool for any frank blood or dark stool. Continue current recommended Protonix twice a day...   Discharge wound care:   Complete by: As directed    Per wound nursing structure.. Home health has been arranged RN to assess.   Increase activity slowly   Complete by: As directed        Medication List    TAKE these medications   atorvastatin 20 MG tablet Commonly known as: LIPITOR Take 20 mg by mouth daily.   Combivent Respimat 20-100 MCG/ACT Aers respimat Generic drug: Ipratropium-Albuterol Inhale 1 puff into the lungs every 6 (six) hours.   ferrous sulfate 325 (65 FE) MG tablet Take 1 tablet (325 mg total) by mouth daily.   hydrALAZINE 50 MG tablet Commonly known as: APRESOLINE Take 1 tablet (50 mg total) by mouth 2 (two) times daily.   lactulose 10 GM/15ML solution Commonly known as: CHRONULAC Take 90 mLs (60 g total) by mouth daily. What changed:   how much to take  when to take this   LORazepam 0.5 MG tablet Commonly known as: ATIVAN Take 1 tablet (0.5 mg total) by mouth 3 (three) times daily as needed for Anxiety   metolazone 5 MG tablet Commonly known as: ZAROXOLYN Take 5 mg by mouth daily.   NovoLOG Mix  70/30 FlexPen (70-30) 100 UNIT/ML FlexPen Generic drug: insulin aspart protamine - aspart 80-84 Units as directed. Inject 84 Units Subcutaneous every morning at breakfast and 80 units at dinner   pantoprazole 40 MG tablet Commonly known as: Protonix Take 1 tablet (40 mg total) by mouth 2 (two) times daily.   potassium chloride 10 MEQ tablet Commonly known as: KLOR-CON Take 10 mEq by mouth daily.   rifaximin 550 MG Tabs tablet Commonly known as: XIFAXAN Take 1 tablet (550 mg total) by mouth 2 (two) times daily.   sodium chloride 0.65 % Soln nasal spray Commonly known as: OCEAN Place 1 spray into both nostrils as needed for congestion (nose irritation).   tamsulosin 0.4 MG Caps  capsule Commonly known as: FLOMAX Take 1 capsule (0.4 mg total) by mouth daily after supper.   torsemide 100 MG tablet Commonly known as: DEMADEX Take 1 tablet (100 mg total) by mouth daily.       Follow-up Information    [facility] Follow up on 06/01/2020.   Specialty: Cardiology Why: at 2:00pm. Enter through the [place] entrance Contact information: [address] [place] [phone]             No Known Allergies   Procedures /Studies:   CT ABDOMEN PELVIS WO CONTRAST  Result Date: 04/23/2020 CLINICAL DATA:  69 year old male with history of abdominal pain and fever. EXAM: CT ABDOMEN AND PELVIS WITHOUT CONTRAST TECHNIQUE: Multidetector CT imaging of the abdomen and pelvis was performed following the standard protocol without IV contrast. COMPARISON:  No priors. FINDINGS: Lower chest: Trace left pleural effusion lying dependently. Diffuse low attenuation throughout the intravascular compartment, suggestive of anemia. Atherosclerotic calcifications in the left anterior descending, left circumflex and right coronary arteries. Calcifications of the aortic valve. Hepatobiliary: Liver has a shrunken appearance and nodular contour, indicative of underlying cirrhosis. Calcified granulomas are noted in the liver. No discrete cystic or solid hepatic lesions are other identified on today's noncontrast CT examination. Calcified gallstones are noted lying dependently in the gallbladder measuring up to 9 mm. Gallbladder is not distended. Pancreas: No definite pancreatic mass or peripancreatic fluid collections or inflammatory changes are noted on today's noncontrast CT examination. Spleen: Spleen is mildly enlarged measuring 16.7 x 7.1 x 16.9 cm (estimated splenic volume of 1,002 mL) . Adrenals/Urinary Tract: There are no abnormal calcifications within the collecting system of either kidney, along the course of  either ureter, or within the lumen of the urinary bladder. No hydroureteronephrosis or perinephric stranding to suggest urinary tract obstruction at this time. The unenhanced appearance of the kidneys is unremarkable bilaterally. Unenhanced appearance of the urinary bladder is normal. Bilateral adrenal glands are normal in appearance. Stomach/Bowel: Unenhanced appearance of the stomach is normal. No pathologic dilatation of small bowel or colon. Appendix is not confidently identified may be surgically absent. Vascular/Lymphatic: Aortic atherosclerosis. Numerous portosystemic collateral pathways, most notable for large splenorenal collateral vessels. No lymphadenopathy noted in the abdomen or pelvis. Reproductive: Prostate gland and seminal vesicles are unremarkable in appearance. Large right-sided hydrocele incompletely imaged. Other: Small umbilical hernia containing only omental fat. Small volume of ascites. No pneumoperitoneum. Musculoskeletal: There are no aggressive appearing lytic or blastic lesions noted in the visualized portions of the skeleton. IMPRESSION: 1. Morphologic changes in the liver compatible with cirrhosis. This is associated with evidence of portal venous hypertension, including splenomegaly and numerous portosystemic collateral vessels. 2. Cholelithiasis without evidence of acute cholecystitis at  this time. Small umbilical hernia containing only omental fat large right-sided hydrocele incompletely imaged. 3. Trace left pleural effusion lying dependent 4. Diffuse low attenuation throughout the intravascular compartment, suggesting anemia. 5. Aortic atherosclerosis, in addition to least 3 vessel coronary artery disease. Please note that although the presence of coronary artery calcium documents the presence of coronary artery disease, the severity of this disease and any potential stenosis cannot be assessed on this non-gated CT examination. Assessment for potential risk factor modification,  dietary therapy or pharmacologic therapy may be warranted, if clinically indicated. 6. There are calcifications of the aortic valve. Echocardiographic correlation for evaluation of potential valvular dysfunction may be warranted if clinically indicated. 7. Additional incidental findings, as above. Electronically Signed   By: Vinnie Langton M.D.   On: 04/23/2020 18:44   DG Chest Port 1 View  Result Date: 04/30/2020 CLINICAL DATA:  Shortness of breath. EXAM: PORTABLE CHEST 1 VIEW COMPARISON:  Radiograph 04/23/2020 FINDINGS: Stable cardiomegaly. Unchanged mediastinal contours. Vascular congestion which appears similar to prior exam. No acute or focal airspace disease. No pleural fluid or pneumothorax. IMPRESSION: Stable cardiomegaly and vascular congestion. No new abnormality. Electronically Signed   By: Keith Rake M.D.   On: 04/30/2020 16:46   DG Chest Port 1 View  Result Date: 04/23/2020 CLINICAL DATA:  Dyspnea, worsening shortness of breath and productive cough EXAM: PORTABLE CHEST 1 VIEW COMPARISON:  Radiograph 03/10/2020, CT 12/14/2019 FINDINGS: Chronically low lung volumes with some central vascular congestion similar to the comparison exam. No convincing features of frank edema at this time. No new focal areas of consolidation. No effusion or pneumothorax is visible. Mild cardiomegaly is similar to prior accounting for differences in technique. Telemetry leads overlie the chest. No acute osseous or soft tissue abnormality. Degenerative changes are present in the imaged spine and shoulders. Dextrocurvature of the thoracic spine similar to prior. IMPRESSION: Chronically low lung volumes with some central vascular congestion. No convincing features of frank edema or other acute cardiopulmonary abnormality. Stable cardiomegaly. Electronically Signed   By: Lovena Le M.D.   On: 04/23/2020 15:18   ECHOCARDIOGRAM COMPLETE  Result Date: 04/25/2020    ECHOCARDIOGRAM REPORT   Patient Name:   [name] Date of Exam: 04/24/2020 Medical Rec #:  [id number]    Height:       70.0 in Accession #:    [id number]   Weight:       304.0 lb Date of Birth:  04-01-1951     BSA:          2.494 m Patient Age:    47 years     BP:           136/65 mmHg Patient Gender: M            HR:           63 bpm. Exam Location:  [facility] Procedure: 2D Echo, Cardiac Doppler and Color Doppler Indications:     CHF-Acute Diastolic 413.24 / M01.02  History:         Patient has prior history of Echocardiogram examinations. CHF;                  Risk Factors:Hypertension.  Sonographer:     Alyse Low Roar Referring Phys:  [id number] Athena Masse Diagnosing Phys: Kathlyn Sacramento MD IMPRESSIONS  1. Left ventricular ejection fraction, by estimation, is 50 to 55%. The left ventricle has low normal function. The left ventricle has no regional wall motion abnormalities. The  left ventricular internal cavity size was mildly dilated. There is mild left ventricular hypertrophy. Left ventricular diastolic parameters are consistent with Grade II diastolic dysfunction (pseudonormalization).  2. Right ventricular systolic function is normal. The right ventricular size is normal. There is mildly elevated pulmonary artery systolic pressure.  3. Left atrial size was moderately dilated.  4. Right atrial size was mildly dilated.  5. The mitral valve is normal in structure. Trivial mitral valve regurgitation. No evidence of mitral stenosis.  6. The aortic valve is abnormal. Aortic valve regurgitation is not visualized. Mild to moderate aortic valve stenosis. Aortic valve area, by VTI measures 1.36 cm. Aortic valve mean gradient measures 14.3 mmHg.  7. The inferior vena cava is dilated in size with <50% respiratory variability, suggesting right atrial pressure of 15 mmHg. FINDINGS  Left Ventricle: Left ventricular ejection fraction, by estimation, is 50 to 55%. The left ventricle has low normal function. The left ventricle has no regional wall motion abnormalities. The left  ventricular internal cavity size was mildly dilated. There is mild left ventricular hypertrophy. Left ventricular diastolic parameters are consistent with Grade II diastolic dysfunction (pseudonormalization). Right Ventricle: The right ventricular size is normal. No increase in right ventricular wall thickness. Right ventricular systolic function is normal. There is mildly elevated pulmonary artery systolic pressure. The tricuspid regurgitant velocity is 2.59  m/s, and with an assumed right atrial pressure of 15 mmHg, the estimated right ventricular systolic pressure is 49.7 mmHg. Left Atrium: Left atrial size was moderately dilated. Right Atrium: Right atrial size was mildly dilated. Pericardium: There is no evidence of pericardial effusion. Mitral Valve: The mitral valve is normal in structure. Normal mobility of the mitral valve leaflets. Moderate mitral annular calcification. Trivial mitral valve regurgitation. No evidence of mitral valve stenosis. Tricuspid Valve: The tricuspid valve is normal in structure. Tricuspid valve regurgitation is trivial. No evidence of tricuspid stenosis. Aortic Valve: The aortic valve is abnormal. . There is mild thickening and moderate calcification of the aortic valve. Aortic valve regurgitation is not visualized. Mild to moderate aortic stenosis is present. There is mild thickening of the aortic valve. There is moderate calcification of the aortic valve. Aortic valve mean gradient measures 14.3 mmHg. Aortic valve peak gradient measures 24.1 mmHg. Aortic valve area, by VTI measures 1.36 cm. Pulmonic Valve: The pulmonic valve was normal in structure. Pulmonic valve regurgitation is not visualized. No evidence of pulmonic stenosis. Aorta: The aortic root is normal in size and structure. Venous: The inferior vena cava is dilated in size with less than 50% respiratory variability, suggesting right atrial pressure of 15 mmHg. IAS/Shunts: No atrial level shunt detected by color flow  Doppler.  LEFT VENTRICLE PLAX 2D LVIDd:         5.46 cm  Diastology LVIDs:         4.09 cm  LV e' lateral:   8.27 cm/s LV PW:         1.22 cm  LV E/e' lateral: 17.9 LV IVS:        1.34 cm  LV e' medial:    8.05 cm/s LVOT diam:     1.70 cm  LV E/e' medial:  18.4 LV SV:         76 LV SV Index:   30 LVOT Area:     2.27 cm  RIGHT VENTRICLE RV Mid diam:    3.78 cm RV S prime:     13.70 cm/s TAPSE (M-mode): 2.2 cm LEFT ATRIUM  Index       RIGHT ATRIUM           Index LA diam:        4.60 cm  1.84 cm/m  RA Area:     22.10 cm LA Vol (A2C):   117.0 ml 46.92 ml/m RA Volume:   71.80 ml  28.79 ml/m LA Vol (A4C):   101.0 ml 40.50 ml/m LA Biplane Vol: 112.0 ml 44.91 ml/m  AORTIC VALVE                    PULMONIC VALVE AV Area (Vmax):    1.35 cm     PV Vmax:        1.45 m/s AV Area (Vmean):   1.39 cm     PV Peak grad:   8.4 mmHg AV Area (VTI):     1.36 cm     RVOT Peak grad: 5 mmHg AV Vmax:           245.50 cm/s AV Vmean:          178.333 cm/s AV VTI:            0.555 m AV Peak Grad:      24.1 mmHg AV Mean Grad:      14.3 mmHg LVOT Vmax:         146.00 cm/s LVOT Vmean:        109.000 cm/s LVOT VTI:          0.333 m LVOT/AV VTI ratio: 0.60  AORTA Ao Root diam: 3.00 cm MITRAL VALVE                TRICUSPID VALVE MV Area (PHT): 3.27 cm     TR Peak grad:   26.8 mmHg MV Decel Time: 232 msec     TR Vmax:        259.00 cm/s MV E velocity: 148.00 cm/s MV A velocity: 107.00 cm/s  SHUNTS MV E/A ratio:  1.38         Systemic VTI:  0.33 m MV A Prime:    11.4 cm/s    Systemic Diam: 1.70 cm Kathlyn Sacramento MD Electronically signed by Kathlyn Sacramento MD Signature Date/Time: 04/25/2020/8:22:25 AM    Final      Subjective:   Patient was seen and examined 05/23/2020, 8:07 AM Patient stable today. No acute distress.  No issues overnight Stable for discharge.  Discharge Exam:    Vitals:   05/22/20 0757 05/22/20 1537 05/22/20 2342 05/23/20 0759  BP: (!) 146/56 (!) 138/54 140/61 (!) 144/50  Pulse: (!) 53 (!) 52 60 (!)  59  Resp: 14 18 17 17   Temp: 97.7 F (36.5 C) 97.7 F (36.5 C) 98 F (36.7 C) 98.3 F (36.8 C)  TempSrc: Oral Oral Axillary Oral  SpO2: 99% 100% 100% 100%  Weight:      Height:        General: Pt lying comfortably in bed & appears in no obvious distress. Cardiovascular: S1 & S2 heard, RRR, S1/S2 +. No murmurs, rubs, gallops or clicks. No JVD or pedal edema. Respiratory: Clear to auscultation without wheezing, rhonchi or crackles. No increased work of breathing. Abdominal:  Non-distended, non-tender & soft. No organomegaly or masses appreciated. Normal bowel sounds heard. CNS: Alert and oriented. No focal deficits. Extremities: no edema, no cyanosis    The results of significant diagnostics from this hospitalization (including imaging, microbiology, ancillary and laboratory) are listed below for reference.  Microbiology:   Recent Results (from the past 240 hour(s))  SARS Coronavirus 2 by RT PCR (hospital order, performed in [facility] hospital lab) Nasopharyngeal Nasopharyngeal Swab     Status: None   Collection Time: 05/19/20 12:10 AM   Specimen: Nasopharyngeal Swab  Result Value Ref Range Status   SARS Coronavirus 2 NEGATIVE NEGATIVE Final    Comment: (NOTE) SARS-CoV-2 target nucleic acids are NOT DETECTED.  The SARS-CoV-2 RNA is generally detectable in upper and lower respiratory specimens during the acute phase of infection. The lowest concentration of SARS-CoV-2 viral copies this assay can detect is 250 copies / mL. A negative result does not preclude SARS-CoV-2 infection and should not be used as the sole basis for treatment or other patient management decisions.  A negative result may occur with improper specimen collection / handling, submission of specimen other than nasopharyngeal swab, presence of viral mutation(s) within the areas targeted by this assay, and inadequate number of viral copies (<250 copies / mL). A negative result must be combined with  clinical observations, patient history, and epidemiological information.  Fact Sheet for Patients:   StrictlyIdeas.no  Fact Sheet for Healthcare Providers: BankingDealers.co.za  This test is not yet approved or  cleared by the [place] FDA and has been authorized for detection and/or diagnosis of SARS-CoV-2 by FDA under an Emergency Use Authorization (EUA).  This EUA will remain in effect (meaning this test can be used) for the duration of the COVID-19 declaration under Section 564(b)(1) of the Act, 21 U.S.C. section 360bbb-3(b)(1), unless the authorization is terminated or revoked sooner.  Performed at [facility], [address] [place], [place] [address]      Labs:   CBC: Recent Labs  Lab 05/19/20 1620 05/20/20 0617 05/21/20 0520 05/22/20 0457 05/23/20 0441  WBC 4.6 4.9 4.0 4.5 3.9*  NEUTROABS 3.9  --   --   --   --   HGB 7.7* 8.4* 8.0* 8.7* 8.6*  HCT 23.3* 25.8* 25.4* 26.4* 27.0*  MCV 94.0 95.6 98.8 95.3 98.5  PLT 75* 84* 66* 77* 76*   Basic Metabolic Panel: Recent Labs  Lab 05/19/20 0930 05/19/20 0930 05/20/20 0617 05/20/20 0617 05/20/20 1244 05/20/20 1545 05/21/20 0520 05/22/20 0457 05/23/20 0441  NA 143   < > 149*  --  145  --  149* 142 142  K 2.6*   < > 2.8*   < > 2.7* 2.9* 3.1* 3.9 4.3  CL 100   < > 105  --  103  --  108 105 103  CO2 30   < > 32  --  28  --  27 28 29   GLUCOSE 193*   < > 139*  --  141*  --  127* 88 101*  BUN 103*   < > 99*  --  98*  --  90* 81* 84*  CREATININE 2.92*   < > 2.90*  --  2.71*  --  2.60* 2.39* 2.82*  CALCIUM 8.9   < > 9.1  --  8.6*  --  8.6* 8.5* 8.8*  MG 2.2  --   --   --   --   --   --   --   --    < > = values in this interval not displayed.   Liver Function Tests: Recent Labs  Lab 05/19/20 0930  AST 29  ALT 15  ALKPHOS 68  BILITOT 1.3*  PROT 6.5  ALBUMIN 3.0*   BNP (last 3  results) Recent Labs    02/23/20 1403 03/10/20 2100 04/23/20 1525   BNP 213.9* 167.4* 376.4*   Cardiac Enzymes: No results for input(s): CKTOTAL, CKMB, CKMBINDEX, TROPONINI in the last 168 hours. CBG: Recent Labs  Lab 05/22/20 2023 05/22/20 2039 05/23/20 0001 05/23/20 0459 05/23/20 0800  GLUCAP 193* 176* 130* 94 109*   Hgb A1c No results for input(s): HGBA1C in the last 72 hours. Lipid Profile No results for input(s): CHOL, HDL, LDLCALC, TRIG, CHOLHDL, LDLDIRECT in the last 72 hours. Thyroid function studies No results for input(s): TSH, T4TOTAL, T3FREE, THYROIDAB in the last 72 hours.  Invalid input(s): FREET3 Anemia work up No results for input(s): VITAMINB12, FOLATE, FERRITIN, TIBC, IRON, RETICCTPCT in the last 72 hours. Urinalysis    Component Value Date/Time   COLORURINE YELLOW (A) 05/18/2020 1432   APPEARANCEUR CLEAR (A) 05/18/2020 1432   APPEARANCEUR Clear 09/10/2014 0456   LABSPEC 1.009 05/18/2020 1432   LABSPEC 1.016 09/10/2014 0456   PHURINE 5.0 05/18/2020 1432   GLUCOSEU NEGATIVE 05/18/2020 1432   GLUCOSEU Negative 09/10/2014 0456   HGBUR NEGATIVE 05/18/2020 1432   BILIRUBINUR NEGATIVE 05/18/2020 1432   BILIRUBINUR Negative 09/10/2014 0456   KETONESUR NEGATIVE 05/18/2020 1432   PROTEINUR NEGATIVE 05/18/2020 1432   NITRITE NEGATIVE 05/18/2020 1432   LEUKOCYTESUR NEGATIVE 05/18/2020 1432   LEUKOCYTESUR Negative 09/10/2014 0456         Time coordinating discharge: Over 45 minutes  SIGNED: Deatra Granieri, MD, FACP, FHM. [facility],  Please use [email or web address] to Page If 7PM-7AM, please contact night-coverage [email or web address], Password [facility] 05/23/2020, 8:07 AM

## 2020-05-23 NOTE — TOC Progression Note (Signed)
Transition of Care [facility]) - Progression Note    Patient Details  Name: Corey West MRN: [id number] Date of Birth: 12-12-1950  Transition of Care [facility]) CM/SW Lamy, RN Phone Number: 05/23/2020, 8:58 AM  Clinical Narrative:    The patient is open with [facility] for Nursing and will have PT and OT as well,  Confirmed with [name] with [facility]       Expected Discharge Plan and Services           Expected Discharge Date: 05/23/20                                     Social Determinants of Health (SDOH) Interventions    Readmission Risk Interventions Readmission Risk Prevention Plan 02/02/2020 12/15/2019 02/03/2019  Transportation Screening Complete Complete Complete  PCP or Specialist Appt within 3-5 Days Complete - -  HRI or Home Care Consult Complete - -  Social Work Consult for [place] Planning/Counseling Complete - -  Palliative Care Screening Not Applicable - -  Medication Review Press photographer) Complete Complete Complete  PCP or Specialist appointment within 3-5 days of discharge - Complete -  [place] or [place] - Complete Complete  SW Recovery Care/Counseling Consult - - Not Complete  SW Consult Not Complete Comments - - NA  Palliative Care Screening - Not Applicable Not Applicable  Skilled Nursing Facility - Not Applicable Not Applicable  Some recent data might be hidden

## 2020-05-23 NOTE — Care Management Important Message (Signed)
Important Message  Patient Details  Name: Corey West MRN: [id number] Date of Birth: 06-Aug-1951   Medicare Important Message Given:  N/A - LOS <3 / Initial given by admissions  Initial Medicare IM given by Patient Access Associate on 05/22/2020 at 11:34am.     Dannette Barbara 05/23/2020, 8:36 AM

## 2020-05-23 NOTE — Progress Notes (Signed)
Pt wants to DC early AM. MD was notified and DC order was releases. Case manager was notified and verified that the [facility] was resumed. Pt's education on her DC papers was done. Pt is alert and oriented, will drive home as per pt.

## 2020-05-28 NOTE — Progress Notes (Signed)
[place]  [phone] [phone]  ID: Corey West OB: Aug 17, 1951  MR#: [id number]  YFV#:[id number]  Patient Care Team: Leonel Ramsay, MD as PCP - General (Infectious Diseases) End, Harrell Gave, MD as PCP - Cardiology (Cardiology) Baxter Hire, MD (Internal Medicine) Lloyd Huger, MD as Consulting Physician (Oncology)  I connected with Corey West on 06/02/20 at  1:30 PM EDT by video enabled telemedicine visit and verified that I am speaking with the correct person using two identifiers.   I discussed the limitations, risks, security and privacy concerns of performing an evaluation and management service by telemedicine and the availability of in-person appointments. I also discussed with the patient that there may be a patient responsible charge related to this service. The patient expressed understanding and agreed to proceed.   Other persons participating in the visit and their role in the encounter: Patient, MD.  Patients location: Clinic. Providers location: Home.  CHIEF COMPLAINT: Anemia, thrombocytopenia  INTERVAL HISTORY: Patient agreed to video assisted telemedicine visit for further evaluation and treatment of his anemia.  He was admitted to the hospital approximately 1 week ago with GI bleed and received at least 1 unit of packed red blood cells.  He feels improved since discharge, but still has chronic weakness and fatigue. He also has multiple chronic medical problems.  He has no neurologic complaints.  He denies any recent fevers or illnesses.  He has a fair appetite, but denies weight loss.  He denies any chest pain, shortness of breath, cough, or hemoptysis. He denies any nausea, vomiting, constipation, or diarrhea.  He denies any further melena or hematochezia.  He has no urinary complaints.  Patient offers no further specific complaints today.  REVIEW OF SYSTEMS:   Review of Systems  Constitutional: Positive for  malaise/fatigue. Negative for fever and weight loss.  Respiratory: Negative.  Negative for cough, hemoptysis and shortness of breath.   Cardiovascular: Negative.  Negative for chest pain and leg swelling.  Gastrointestinal: Negative.  Negative for abdominal pain, blood in stool and melena.  Genitourinary: Negative.  Negative for hematuria.  Musculoskeletal: Negative.  Negative for back pain.  Skin: Negative.  Negative for rash.  Neurological: Positive for weakness. Negative for sensory change, focal weakness and headaches.  Psychiatric/Behavioral: Negative.  The patient is not nervous/anxious.     As per HPI. Otherwise, a complete review of systems is negative.  PAST MEDICAL HISTORY: Past Medical History:  Diagnosis Date   Anemia    Anxiety    controlled;    Arthritis    AVM (arteriovenous malformation) of stomach, acquired with hemorrhage    Barrett's esophagus    CHF (congestive heart failure) (HCC)    Chronic kidney disease    renal infufficiency   Cirrhosis (HCC)    Depression    controlled;    Diabetes mellitus without complication (HCC)    not controlled, taking insulin but sugar continues to run high;    Edema    Esophageal varices (HCC)    GAVE (gastric antral vascular ectasia)    GERD (gastroesophageal reflux disease)    History of hiatal hernia    Hyperlipidemia    Hypertension    controlled well;    Liver cirrhosis (HCC)    Nephropathy, 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[place])    Obesity    Pancytopenia (HCC)    Polyp, stomach    with chronic blood loss   Sleep apnea    does not wear a cpap, Medicare would  not pay for it    Venous stasis dermatitis of both lower extremities    Venous stasis of both lower extremities    with cellulitis    PAST SURGICAL HISTORY: Past Surgical History:  Procedure Laterality Date   ESOPHAGOGASTRODUODENOSCOPY N/A 02/09/2015   Procedure: ESOPHAGOGASTRODUODENOSCOPY (EGD);  Surgeon: Manya Silvas, MD;  Location: [facility]  ENDOSCOPY;  Service: Endoscopy;  Laterality: N/A;   ESOPHAGOGASTRODUODENOSCOPY N/A 07/22/2015   Procedure: ESOPHAGOGASTRODUODENOSCOPY (EGD);  Surgeon: Manya Silvas, MD;  Location: [facility] ENDOSCOPY;  Service: Endoscopy;  Laterality: N/A;   ESOPHAGOGASTRODUODENOSCOPY N/A 06/26/2017   Procedure: ESOPHAGOGASTRODUODENOSCOPY (EGD);  Surgeon: Manya Silvas, MD;  Location: [facility] ENDOSCOPY;  Service: Endoscopy;  Laterality: N/A;   ESOPHAGOGASTRODUODENOSCOPY N/A 06/27/2017   Procedure: ESOPHAGOGASTRODUODENOSCOPY (EGD);  Surgeon: Manya Silvas, MD;  Location: [facility] ENDOSCOPY;  Service: Endoscopy;  Laterality: N/A;   ESOPHAGOGASTRODUODENOSCOPY N/A 06/28/2017   Procedure: ESOPHAGOGASTRODUODENOSCOPY (EGD);  Surgeon: Manya Silvas, MD;  Location: [facility] ENDOSCOPY;  Service: Endoscopy;  Laterality: N/A;   ESOPHAGOGASTRODUODENOSCOPY Bilateral 10/11/2017   Procedure: ESOPHAGOGASTRODUODENOSCOPY (EGD);  Surgeon: Manya Silvas, MD;  Location: [facility] ENDOSCOPY;  Service: Endoscopy;  Laterality: Bilateral;   ESOPHAGOGASTRODUODENOSCOPY N/A 12/04/2017   Procedure: ESOPHAGOGASTRODUODENOSCOPY (EGD);  Surgeon: Manya Silvas, MD;  Location: [facility] ENDOSCOPY;  Service: Endoscopy;  Laterality: N/A;   ESOPHAGOGASTRODUODENOSCOPY N/A 03/06/2019   Procedure: ESOPHAGOGASTRODUODENOSCOPY (EGD);  Surgeon: Toledo, Benay Pike, MD;  Location: [facility] ENDOSCOPY;  Service: Gastroenterology;  Laterality: N/A;   ESOPHAGOGASTRODUODENOSCOPY (EGD) WITH PROPOFOL N/A 07/20/2015   Procedure: ESOPHAGOGASTRODUODENOSCOPY (EGD) WITH PROPOFOL;  Surgeon: Manya Silvas, MD;  Location: [facility] ENDOSCOPY;  Service: Endoscopy;  Laterality: N/A;   ESOPHAGOGASTRODUODENOSCOPY (EGD) WITH PROPOFOL N/A 09/16/2015   Procedure: ESOPHAGOGASTRODUODENOSCOPY (EGD) WITH PROPOFOL;  Surgeon: Manya Silvas, MD;  Location: [facility] ENDOSCOPY;  Service: Endoscopy;  Laterality: N/A;   ESOPHAGOGASTRODUODENOSCOPY (EGD) WITH PROPOFOL N/A 03/16/2016   Procedure:  ESOPHAGOGASTRODUODENOSCOPY (EGD) WITH PROPOFOL;  Surgeon: Manya Silvas, MD;  Location[facility] ENDOSCOPY;  Service: Endoscopy;  Laterality: N/A;   ESOPHAGOGASTRODUODENOSCOPY (EGD) WITH PROPOFOL N/A 09/14/2016   Procedure: ESOPHAGOGASTRODUODENOSCOPY (EGD) WITH PROPOFOL;  Surgeon: Manya Silvas, MD;  Location: [facility] ENDOSCOPY;  Service: Endoscopy;  Laterality: N/A;   ESOPHAGOGASTRODUODENOSCOPY (EGD) WITH PROPOFOL N/A 11/05/2016   Procedure: ESOPHAGOGASTRODUODENOSCOPY (EGD) WITH PROPOFOL;  Surgeon: Manya Silvas, MD;  Location: [facility] ENDOSCOPY;  Service: Endoscopy;  Laterality: N/A;   ESOPHAGOGASTRODUODENOSCOPY (EGD) WITH PROPOFOL N/A 02/06/2017   Procedure: ESOPHAGOGASTRODUODENOSCOPY (EGD) WITH PROPOFOL;  Surgeon: Manya Silvas, MD;  Location: [facility] ENDOSCOPY;  Service: Endoscopy;  Laterality: N/A;   ESOPHAGOGASTRODUODENOSCOPY (EGD) WITH PROPOFOL N/A 04/24/2017   Procedure: ESOPHAGOGASTRODUODENOSCOPY (EGD) WITH PROPOFOL;  Surgeon: Manya Silvas, MD;  Location: [facility] ENDOSCOPY;  Service: Endoscopy;  Laterality: N/A;   ESOPHAGOGASTRODUODENOSCOPY (EGD) WITH PROPOFOL N/A 07/31/2017   Procedure: ESOPHAGOGASTRODUODENOSCOPY (EGD) WITH PROPOFOL;  Surgeon: Manya Silvas, MD;  Location: [facility] ENDOSCOPY;  Service: Endoscopy;  Laterality: N/A;   ESOPHAGOGASTRODUODENOSCOPY (EGD) WITH PROPOFOL N/A 08/08/2018   Procedure: ESOPHAGOGASTRODUODENOSCOPY (EGD) WITH PROPOFOL;  Surgeon: Manya Silvas, MD;  Location: [facility] ENDOSCOPY;  Service: Endoscopy;  Laterality: N/A;   ESOPHAGOGASTRODUODENOSCOPY (EGD) WITH PROPOFOL N/A 05/21/2020   Procedure: ESOPHAGOGASTRODUODENOSCOPY (EGD) WITH PROPOFOL;  Surgeon: [name] Manifold, MD;  Location: [facility] ENDOSCOPY;  Service: Endoscopy;  Laterality: N/A;   GIVENS CAPSULE STUDY N/A 12/05/2017   Procedure: GIVENS CAPSULE STUDY;  Surgeon: Manya Silvas, MD;  Location: [facility] ENDOSCOPY;  Service: Endoscopy;  Laterality: N/A;   TONSILLECTOMY     TONSILLECTOMY AND  ADENOIDECTOMY     ULNAR NERVE TRANSPOSITION     UVULOPALATOPHARYNGOPLASTY      FAMILY HISTORY: Family History  Problem Relation Age of Onset   Diabetes Other    Transient ischemic attack Father    CAD Father     ADVANCED DIRECTIVES (Y/N):  N  HEALTH MAINTENANCE: Social History   Tobacco Use   Smoking status: Former Smoker    Years: 20.00    Types: Cigars, Cigarettes   Smokeless tobacco: Never Used  Scientific laboratory technician Use: Never used  Substance Use Topics   Alcohol use: No    Comment: stopped 15 years ago   Drug use: No     Colonoscopy:  PAP:  Bone density:  Lipid panel:  No Known Allergies  Current Outpatient Medications  Medication Sig Dispense Refill   atorvastatin (LIPITOR) 20 MG tablet Take 20 mg by mouth daily.     ferrous sulfate 325 (65 FE) MG tablet Take 1 tablet (325 mg total) by mouth daily. 60 tablet 1   hydrALAZINE (APRESOLINE) 50 MG tablet Take 1 tablet (50 mg total) by mouth 2 (two) times daily. 60 tablet 1   Ipratropium-Albuterol (COMBIVENT RESPIMAT) 20-100 MCG/ACT AERS respimat Inhale 1 puff into the lungs every 6 (six) hours. 4 g 0   lactulose (CHRONULAC) 10 GM/15ML solution Take 90 mLs (60 g total) by mouth daily. (Patient taking differently: Take 45 g by mouth 2 (two) times daily. ) 236 mL 0   LORazepam (ATIVAN) 0.5 MG tablet Take 1 tablet (0.5 mg total) by mouth 3 (three) times daily as needed for Anxiety     metolazone (ZAROXOLYN) 5 MG tablet Take 5 mg by mouth daily. Taking every 3 days     NOVOLOG MIX 70/30 FLEXPEN (70-30) 100 UNIT/ML FlexPen 80-84 Units as directed. Inject 84 Units Subcutaneous every morning at breakfast and 80 units at dinner     pantoprazole (PROTONIX) 40 MG tablet Take 1 tablet (40 mg total) by mouth 2 (two) times daily. 60 tablet 11   potassium chloride (KLOR-CON) 10 MEQ tablet Take 10 mEq by mouth daily.     rifaximin (XIFAXAN) 550 MG TABS tablet Take 1 tablet (550 mg total) by mouth 2 (two) times  daily. 60 tablet 0   sodium chloride (OCEAN) 0.65 % SOLN nasal spray Place 1 spray into both nostrils as needed for congestion (nose irritation). 60 mL 0   tamsulosin (FLOMAX) 0.4 MG CAPS capsule Take 1 capsule (0.4 mg total) by mouth daily after supper. 30 capsule 1   torsemide (DEMADEX) 100 MG tablet Take 1 tablet (100 mg total) by mouth daily. 30 tablet 1   No current facility-administered medications for this visit.   Facility-Administered Medications Ordered in Other Visits  Medication Dose Route Frequency Provider Last Rate Last Admin   epoetin alfa-epbx (RETACRIT) injection 40,000 Units  40,000 Units Subcutaneous Once Lloyd Huger, MD        OBJECTIVE: Vitals:   06/02/20 1354  BP: 140/77  Pulse: 74  Resp: 20  Temp: (!) 97.2 F (36.2 C)  SpO2: 100%     Body mass index is 41.57 kg/m.    ECOG FS:0 - Asymptomatic  General: Well-developed, well-nourished, no acute distress. HEENT: Normocephalic. Neuro: Alert, answering all questions appropriately. Cranial nerves grossly intact. Psych: Normal affect.   LAB RESULTS:  Lab Results  Component Value Date   NA 142 05/23/2020   K 4.3 05/23/2020   CL 103 05/23/2020   CO2 29  05/23/2020   GLUCOSE 101 (H) 05/23/2020   BUN 84 (H) 05/23/2020   CREATININE 2.82 (H) 05/23/2020   CALCIUM 8.8 (L) 05/23/2020   PROT 6.5 05/19/2020   ALBUMIN 3.0 (L) 05/19/2020   AST 29 05/19/2020   ALT 15 05/19/2020   ALKPHOS 68 05/19/2020   BILITOT 1.3 (H) 05/19/2020   GFRNONAA 22 (L) 05/23/2020   GFRAA 25 (L) 05/23/2020    Lab Results  Component Value Date   WBC 4.8 06/02/2020   NEUTROABS 3.9 06/02/2020   HGB 7.6 (L) 06/02/2020   HCT 22.3 (L) 06/02/2020   MCV 92.5 06/02/2020   PLT 79 (L) 06/02/2020   Lab Results  Component Value Date   IRON 89 05/09/2020   TIBC 333 05/09/2020   IRONPCTSAT 27 05/09/2020   Lab Results  Component Value Date   FERRITIN 35 04/28/2020     STUDIES: No results found.  ASSESSMENT: Anemia,  thrombocytopenia  PLAN:    1.  Anemia: Multifactorial, possibly secondary to chronic renal insufficiency as well as GI blood loss from his known AV malformations and gastritis.  His most recent EGD was completed on May 21, 2020.  Patient's hemoglobin remains significantly decreased, but improved to 7.6.  His most recent iron stores are within normal limits.  He does not require blood transfusion or iron infusion today, but will proceed with 40,000 units Retacrit today.  Return to clinic in 4 weeks for repeat laboratory work, further evaluation, and continuation of treatment. 2.  Thrombocytopenia: Chronic and unchanged. 3.  Chronic renal insufficiency: Chronic and unchanged.  Patient's most recent creatinine was 2.82 which is approximately his baseline.  Monitor.  I provided 30 minutes of face-to-face video visit time during this encounter which included chart review, counseling, and coordination of care as documented above.    Patient expressed understanding and was in agreement with this plan. He also understands that He can call clinic at any time with any questions, concerns, or complaints.    Lloyd Huger, MD   06/02/2020 2:15 PM

## 2020-05-30 ENCOUNTER — Other Ambulatory Visit
Admission: RE | Admit: 2020-05-30 | Discharge: 2020-05-30 | Disposition: A | Discharge status: Home / Self Care | Payer: Medicare Other | Source: Ambulatory Visit | Attending: Infectious Diseases | Admitting: Infectious Diseases

## 2020-05-30 ENCOUNTER — Telehealth (HOSPITAL_COMMUNITY): Payer: Self-pay

## 2020-05-30 DIAGNOSIS — N183 Chronic kidney disease, stage 3 unspecified: Secondary | ICD-10-CM | POA: Insufficient documentation

## 2020-05-30 DIAGNOSIS — K7469 Other cirrhosis of liver: Secondary | ICD-10-CM | POA: Insufficient documentation

## 2020-05-30 LAB — AMMONIA: Ammonia: 24 umol/L (ref 9–35)
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
## 2020-06-01 ENCOUNTER — Encounter: Payer: Self-pay | Admitting: Oncology

## 2020-06-01 ENCOUNTER — Ambulatory Visit: Payer: Medicare Other | Admitting: Family

## 2020-06-01 NOTE — Progress Notes (Signed)
Patient denies any questions or concerns.

## 2020-06-02 ENCOUNTER — Inpatient Hospital Stay: Payer: Medicare Other | Attending: Oncology

## 2020-06-02 ENCOUNTER — Inpatient Hospital Stay: Payer: Medicare Other

## 2020-06-02 ENCOUNTER — Other Ambulatory Visit: Payer: Self-pay | Admitting: *Deleted

## 2020-06-02 ENCOUNTER — Inpatient Hospital Stay (HOSPITAL_BASED_OUTPATIENT_CLINIC_OR_DEPARTMENT_OTHER): Payer: Medicare Other | Admitting: Oncology

## 2020-06-02 ENCOUNTER — Encounter: Payer: Self-pay | Admitting: Oncology

## 2020-06-02 ENCOUNTER — Other Ambulatory Visit: Payer: Self-pay

## 2020-06-02 VITALS — BP 140/77 | HR 74 | Temp 97.2°F | Resp 20 | Wt 281.5 lb

## 2020-06-02 DIAGNOSIS — D500 Iron deficiency anemia secondary to blood loss (chronic): Secondary | ICD-10-CM

## 2020-06-02 DIAGNOSIS — N189 Chronic kidney disease, unspecified: Secondary | ICD-10-CM | POA: Insufficient documentation

## 2020-06-02 DIAGNOSIS — D649 Anemia, unspecified: Secondary | ICD-10-CM

## 2020-06-02 DIAGNOSIS — D631 Anemia in chronic kidney disease: Secondary | ICD-10-CM | POA: Diagnosis not present

## 2020-06-02 LAB — CBC WITH DIFFERENTIAL/PLATELET
Abs Immature Granulocytes: 0.02 10*3/uL (ref 0.00–0.07)
Basophils Absolute: 0 10*3/uL (ref 0.0–0.1)
Basophils Relative: 0 %
Eosinophils Absolute: 0 10*3/uL (ref 0.0–0.5)
Eosinophils Relative: 0 %
HCT: 22.3 % — ABNORMAL LOW (ref 39.0–52.0)
Hemoglobin: 7.6 g/dL — ABNORMAL LOW (ref 13.0–17.0)
Immature Granulocytes: 0 %
Lymphocytes Relative: 10 %
Lymphs Abs: 0.5 10*3/uL — ABNORMAL LOW (ref 0.7–4.0)
MCH: 31.5 pg (ref 26.0–34.0)
MCHC: 34.1 g/dL (ref 30.0–36.0)
MCV: 92.5 fL (ref 80.0–100.0)
Monocytes Absolute: 0.4 10*3/uL (ref 0.1–1.0)
Monocytes Relative: 8 %
Neutro Abs: 3.9 10*3/uL (ref 1.7–7.7)
Neutrophils Relative %: 82 %
Platelets: 79 10*3/uL — ABNORMAL LOW (ref 150–400)
RBC: 2.41 MIL/uL — ABNORMAL LOW (ref 4.22–5.81)
RDW: 15.8 % — ABNORMAL HIGH (ref 11.5–15.5)
WBC: 4.8 10*3/uL (ref 4.0–10.5)
nRBC: 0 % (ref 0.0–0.2)

## 2020-06-02 LAB — IRON AND TIBC
Iron: 57 ug/dL (ref 45–182)
Saturation Ratios: 17 % — ABNORMAL LOW (ref 17.9–39.5)
TIBC: 335 ug/dL (ref 250–450)
UIBC: 278 ug/dL

## 2020-06-02 LAB — FERRITIN: Ferritin: 35 ng/mL (ref 24–336)

## 2020-06-02 MED ORDER — EPOETIN ALFA-EPBX 40000 UNIT/ML IJ SOLN
40000.0000 [IU] | Freq: Once | INTRAMUSCULAR | Status: AC
  Administered 2020-06-02: 40000 [IU] via SUBCUTANEOUS
  Filled 2020-06-02: qty 1

## 2020-06-06 ENCOUNTER — Other Ambulatory Visit: Payer: Self-pay

## 2020-06-06 ENCOUNTER — Emergency Department: Payer: Medicare Other

## 2020-06-06 ENCOUNTER — Encounter: Payer: Self-pay | Admitting: Emergency Medicine

## 2020-06-06 ENCOUNTER — Inpatient Hospital Stay
Admission: EM | Admit: 2020-06-06 | Discharge: 2020-06-11 | DRG: 442 | Disposition: A | Discharge status: Home Health | Payer: Medicare Other | Attending: Internal Medicine | Admitting: Internal Medicine

## 2020-06-06 DIAGNOSIS — D638 Anemia in other chronic diseases classified elsewhere: Secondary | ICD-10-CM

## 2020-06-06 DIAGNOSIS — I480 Paroxysmal atrial fibrillation: Secondary | ICD-10-CM | POA: Diagnosis present

## 2020-06-06 DIAGNOSIS — K2270 Barrett's esophagus without dysplasia: Secondary | ICD-10-CM | POA: Diagnosis present

## 2020-06-06 DIAGNOSIS — E1122 Type 2 diabetes mellitus with diabetic chronic kidney disease: Secondary | ICD-10-CM | POA: Diagnosis present

## 2020-06-06 DIAGNOSIS — N5089 Other specified disorders of the male genital organs: Secondary | ICD-10-CM | POA: Diagnosis present

## 2020-06-06 DIAGNOSIS — I130 Hypertensive heart and chronic kidney disease with heart failure and stage 1 through stage 4 chronic kidney disease, or unspecified chronic kidney disease: Secondary | ICD-10-CM | POA: Diagnosis present

## 2020-06-06 DIAGNOSIS — Z79899 Other long term (current) drug therapy: Secondary | ICD-10-CM

## 2020-06-06 DIAGNOSIS — R9431 Abnormal electrocardiogram [ECG] [EKG]: Secondary | ICD-10-CM

## 2020-06-06 DIAGNOSIS — R4182 Altered mental status, unspecified: Secondary | ICD-10-CM

## 2020-06-06 DIAGNOSIS — I872 Venous insufficiency (chronic) (peripheral): Secondary | ICD-10-CM | POA: Diagnosis present

## 2020-06-06 DIAGNOSIS — K7290 Hepatic failure, unspecified without coma: Principal | ICD-10-CM | POA: Diagnosis present

## 2020-06-06 DIAGNOSIS — E66813 Obesity, class 3: Secondary | ICD-10-CM | POA: Diagnosis present

## 2020-06-06 DIAGNOSIS — E11649 Type 2 diabetes mellitus with hypoglycemia without coma: Secondary | ICD-10-CM | POA: Diagnosis present

## 2020-06-06 DIAGNOSIS — F419 Anxiety disorder, unspecified: Secondary | ICD-10-CM | POA: Diagnosis present

## 2020-06-06 DIAGNOSIS — I2510 Atherosclerotic heart disease of native coronary artery without angina pectoris: Secondary | ICD-10-CM | POA: Diagnosis present

## 2020-06-06 DIAGNOSIS — K219 Gastro-esophageal reflux disease without esophagitis: Secondary | ICD-10-CM | POA: Diagnosis present

## 2020-06-06 DIAGNOSIS — N183 Chronic kidney disease, stage 3 unspecified: Secondary | ICD-10-CM | POA: Diagnosis present

## 2020-06-06 DIAGNOSIS — R188 Other ascites: Secondary | ICD-10-CM

## 2020-06-06 DIAGNOSIS — Z794 Long term (current) use of insulin: Secondary | ICD-10-CM

## 2020-06-06 DIAGNOSIS — Z20822 Contact with and (suspected) exposure to covid-19: Secondary | ICD-10-CM | POA: Diagnosis present

## 2020-06-06 DIAGNOSIS — Z6841 Body Mass Index (BMI) 40.0 and over, adult: Secondary | ICD-10-CM

## 2020-06-06 DIAGNOSIS — E162 Hypoglycemia, unspecified: Secondary | ICD-10-CM

## 2020-06-06 DIAGNOSIS — I509 Heart failure, unspecified: Secondary | ICD-10-CM | POA: Diagnosis present

## 2020-06-06 DIAGNOSIS — N184 Chronic kidney disease, stage 4 (severe): Secondary | ICD-10-CM | POA: Diagnosis present

## 2020-06-06 DIAGNOSIS — E876 Hypokalemia: Secondary | ICD-10-CM | POA: Diagnosis present

## 2020-06-06 DIAGNOSIS — J449 Chronic obstructive pulmonary disease, unspecified: Secondary | ICD-10-CM | POA: Diagnosis present

## 2020-06-06 DIAGNOSIS — K7460 Unspecified cirrhosis of liver: Secondary | ICD-10-CM | POA: Diagnosis present

## 2020-06-06 DIAGNOSIS — Z8249 Family history of ischemic heart disease and other diseases of the circulatory system: Secondary | ICD-10-CM

## 2020-06-06 DIAGNOSIS — K922 Gastrointestinal hemorrhage, unspecified: Secondary | ICD-10-CM | POA: Diagnosis present

## 2020-06-06 DIAGNOSIS — K7682 Hepatic encephalopathy: Secondary | ICD-10-CM

## 2020-06-06 DIAGNOSIS — Z833 Family history of diabetes mellitus: Secondary | ICD-10-CM

## 2020-06-06 DIAGNOSIS — E785 Hyperlipidemia, unspecified: Secondary | ICD-10-CM | POA: Diagnosis present

## 2020-06-06 DIAGNOSIS — I878 Other specified disorders of veins: Secondary | ICD-10-CM | POA: Diagnosis present

## 2020-06-06 HISTORY — DX: Cardiac arrhythmia, unspecified: I49.9

## 2020-06-06 HISTORY — DX: Chronic obstructive pulmonary disease, unspecified: J44.9

## 2020-06-06 LAB — AMMONIA: Ammonia: 103 umol/L — ABNORMAL HIGH (ref 9–35)

## 2020-06-06 LAB — COMPREHENSIVE METABOLIC PANEL
ALT: 31 U/L (ref 0–44)
AST: 46 U/L — ABNORMAL HIGH (ref 15–41)
Albumin: 2.8 g/dL — ABNORMAL LOW (ref 3.5–5.0)
Alkaline Phosphatase: 103 U/L (ref 38–126)
Anion gap: 16 — ABNORMAL HIGH (ref 5–15)
BUN: 101 mg/dL — ABNORMAL HIGH (ref 8–23)
CO2: 28 mmol/L (ref 22–32)
Calcium: 8.7 mg/dL — ABNORMAL LOW (ref 8.9–10.3)
Chloride: 96 mmol/L — ABNORMAL LOW (ref 98–111)
Creatinine, Ser: 2.89 mg/dL — ABNORMAL HIGH (ref 0.61–1.24)
GFR calc Af Amer: 25 mL/min — ABNORMAL LOW (ref >60)
GFR calc non Af Amer: 21 mL/min — ABNORMAL LOW (ref >60)
Glucose, Bld: 163 mg/dL — ABNORMAL HIGH (ref 70–99)
Potassium: 2.3 mmol/L — CL (ref 3.5–5.1)
Sodium: 140 mmol/L (ref 135–145)
Total Bilirubin: 1 mg/dL (ref 0.3–1.2)
Total Protein: 6.6 g/dL (ref 6.5–8.1)

## 2020-06-06 LAB — GLUCOSE, CAPILLARY: Glucose-Capillary: 181 mg/dL — ABNORMAL HIGH (ref 70–99)

## 2020-06-06 LAB — CBC WITH DIFFERENTIAL/PLATELET
Abs Immature Granulocytes: 0.03 10*3/uL (ref 0.00–0.07)
Basophils Absolute: 0 10*3/uL (ref 0.0–0.1)
Basophils Relative: 0 %
Eosinophils Absolute: 0 10*3/uL (ref 0.0–0.5)
Eosinophils Relative: 0 %
HCT: 22.5 % — ABNORMAL LOW (ref 39.0–52.0)
Hemoglobin: 7.4 g/dL — ABNORMAL LOW (ref 13.0–17.0)
Immature Granulocytes: 1 %
Lymphocytes Relative: 7 %
Lymphs Abs: 0.3 10*3/uL — ABNORMAL LOW (ref 0.7–4.0)
MCH: 31.4 pg (ref 26.0–34.0)
MCHC: 32.9 g/dL (ref 30.0–36.0)
MCV: 95.3 fL (ref 80.0–100.0)
Monocytes Absolute: 0.4 10*3/uL (ref 0.1–1.0)
Monocytes Relative: 8 %
Neutro Abs: 4.4 10*3/uL (ref 1.7–7.7)
Neutrophils Relative %: 84 %
Platelets: 80 10*3/uL — ABNORMAL LOW (ref 150–400)
RBC: 2.36 MIL/uL — ABNORMAL LOW (ref 4.22–5.81)
RDW: 16.6 % — ABNORMAL HIGH (ref 11.5–15.5)
WBC: 5.2 10*3/uL (ref 4.0–10.5)
nRBC: 0 % (ref 0.0–0.2)

## 2020-06-06 LAB — MAGNESIUM: Magnesium: 2.1 mg/dL (ref 1.7–2.4)

## 2020-06-06 LAB — PROTIME-INR
INR: 1.4 — ABNORMAL HIGH (ref 0.8–1.2)
Prothrombin Time: 16.2 seconds — ABNORMAL HIGH (ref 11.4–15.2)

## 2020-06-06 LAB — TROPONIN I (HIGH SENSITIVITY): Troponin I (High Sensitivity): 36 ng/L — ABNORMAL HIGH (ref <18)

## 2020-06-06 MED ORDER — POTASSIUM CHLORIDE CRYS ER 20 MEQ PO TBCR
40.0000 meq | EXTENDED_RELEASE_TABLET | Freq: Once | ORAL | Status: AC
  Administered 2020-06-07: 40 meq via ORAL
  Filled 2020-06-06: qty 2

## 2020-06-06 MED ORDER — LACTULOSE 10 GM/15ML PO SOLN
45.0000 g | Freq: Once | ORAL | Status: AC
  Administered 2020-06-07: 45 g via ORAL
  Filled 2020-06-06: qty 90

## 2020-06-06 MED ORDER — MAGNESIUM SULFATE 2 GM/50ML IV SOLN
2.0000 g | Freq: Once | INTRAVENOUS | Status: AC
  Administered 2020-06-06: 2 g via INTRAVENOUS
  Filled 2020-06-06: qty 50

## 2020-06-06 MED ORDER — POTASSIUM CHLORIDE 10 MEQ/100ML IV SOLN
10.0000 meq | INTRAVENOUS | Status: AC
  Administered 2020-06-06 – 2020-06-07 (×5): 10 meq via INTRAVENOUS
  Filled 2020-06-06 (×5): qty 100
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
## 2020-06-06 NOTE — ED Provider Notes (Signed)
[facility] Emergency Department Provider Note   ____________________________________________   First MD Initiated Contact with Patient 06/06/20 2150     (approximate)  I have reviewed the triage vital signs and the nursing notes.   HISTORY  Chief Complaint Altered Mental Status    HPI Corey West is a 69 y.o. male with past medical history of hypertension, hyperlipidemia, cirrhosis with varices, CHF, atrial fibrillation, diabetes, CKD, and venous stasis dermatitis of lower extremities who presents to the ED for altered mental status.  History is limited due to patient's confusion.  EMS states that they were initially called out to patient's home, where he lives alone, for hypoglycemia.  Fire department had found his initial blood sugar to be 27, which was corrected and 307 when EMS arrived.  Patient remained confused and so he was brought to the ED for further evaluation.  Patient currently denies any complaints, states he feels fine but frequently repeats answers to questions.  He is able to state his name but repeatedly states his birth year after most responses.        Past Medical History:  Diagnosis Date  . Anemia   . Anxiety    controlled;   . Arthritis   . AVM (arteriovenous malformation) of stomach, acquired with hemorrhage   . Barrett's esophagus   . CHF (congestive heart failure) ([place])   . Chronic kidney disease    renal infufficiency  . Cirrhosis ([place])   . Depression    controlled;   Marland Kitchen Diabetes mellitus without complication ([place])    not controlled, taking insulin but sugar continues to run high;   . Edema   . Esophageal varices ([place])   . GAVE (gastric antral vascular ectasia)   . GERD (gastroesophageal reflux disease)   . History of hiatal hernia   . Hyperlipidemia   . Hypertension    controlled well;   . Liver cirrhosis ([place])   . Nephropathy, diabetic ([place])   . Obesity   . Pancytopenia ([place])   . Polyp, stomach    with  chronic blood loss  . Sleep apnea    does not wear a cpap, Medicare would not pay for it   . Venous stasis dermatitis of both lower extremities   . Venous stasis of both lower extremities    with cellulitis    Patient Active Problem List   Diagnosis Date Noted  . Portal hypertensive gastropathy ([place])   . Symptomatic anemia 05/19/2020  . Acute blood loss anemia 05/19/2020  . Acute GI bleeding 05/19/2020  . Bacteremia due to Klebsiella pneumoniae 04/26/2020  . Palliative care by specialist   . DNR (do not resuscitate) discussion   . COPD with chronic bronchitis ([place]) 04/23/2020  . Paroxysmal atrial fibrillation ([place]) 04/20/2020  . Cellulitis of left leg 02/23/2020  . Contusion of ribs, left, initial encounter 02/23/2020  . Acute hypoxemic respiratory failure ([place]) 02/04/2020  . Volume overload 01/28/2020  . Hyperlipidemia associated with type 2 diabetes mellitus ([place]) 01/28/2020  . Liver cirrhosis ([place])   . Anxiety   . SOB (shortness of breath)   . Elevated troponin   . CKD (chronic kidney disease) stage 4, GFR 15-29 ml/min (HCC)   . Unstageable pressure ulcer of sacral region ([place]) 11/28/2019  . Chronic diastolic CHF (congestive heart failure) ([place]) 11/27/2019  . Type II diabetes mellitus with renal manifestations ([place]) 11/27/2019  . Cellulitis of left lower extremity 11/27/2019  . Cellulitis of lower extremity 11/27/2019  .  Cyst of pancreas 05/18/2019  . HCAP (healthcare-associated pneumonia) 02/03/2019  . Aphasia 01/14/2019  . Acute on chronic renal failure ([place]) 01/14/2019  . Malnutrition of moderate degree 12/03/2018  . C. difficile diarrhea 12/02/2018  . Ankle fracture, left, closed, initial encounter 10/15/2018  . Ankle fracture, right, closed, initial encounter 10/15/2018  . Ankle fracture 10/15/2018  . Pressure injury of skin 07/19/2018  . UTI (urinary tract infection) 07/18/2018  . Acute kidney injury superimposed on CKD ([place]) 01/02/2018  . GAVE (gastric antral  vascular ectasia) 01/02/2018  . Goals of care, counseling/discussion   . Palliative care encounter   . Acute hepatic encephalopathy 12/16/2017  . Hepatic encephalopathy ([place]) 10/18/2017  . Hypokalemia 10/09/2017  . Hyperglycemia 10/09/2017  . Sepsis ([place]) 06/12/2017  . Type 2 diabetes mellitus with diabetic nephropathy, with long-term current use of insulin ([place]) 05/29/2017  . Long term (current) use of insulin ([place]) 05/29/2017  . Cellulitis in diabetic foot ([place]) 02/21/2017  . Cellulitis 02/21/2017  . Upper GI bleed/melena 07/20/2015  . Hematemesis 02/08/2015  . Cirrhosis ([place]) 02/08/2015  . Esophageal varices ([place]) 02/08/2015  . Gastroesophageal reflux disease 02/08/2015  . Hyperlipidemia 02/08/2015  . Chronic venous stasis dermatitis of both lower extremities 10/06/2014  . Cutaneous abscess of limb, unspecified 09/17/2014  . Edema 05/25/2014  . Hypertension 05/25/2014  . Obesity, Class III, BMI 40-49.9 (morbid obesity) ([place]) 05/25/2014  . Sleep apnea 05/25/2014  . Barrett's esophagus 05/25/2014  . Pancytopenia ([place]) 05/25/2014  . History of esophageal varices 05/25/2014  . Iron deficiency anemia 12/04/2013  . Thrombocytopenia ([place]) 12/04/2013  . Splenomegaly 11/04/2013    Past Surgical History:  Procedure Laterality Date  . ESOPHAGOGASTRODUODENOSCOPY N/A 02/09/2015   Procedure: ESOPHAGOGASTRODUODENOSCOPY (EGD);  Surgeon: Manya Silvas, MD;  Location: [facility] ENDOSCOPY;  Service: Endoscopy;  Laterality: N/A;  . ESOPHAGOGASTRODUODENOSCOPY N/A 07/22/2015   Procedure: ESOPHAGOGASTRODUODENOSCOPY (EGD);  Surgeon: Manya Silvas, MD;  Location: [facility] ENDOSCOPY;  Service: Endoscopy;  Laterality: N/A;  . ESOPHAGOGASTRODUODENOSCOPY N/A 06/26/2017   Procedure: ESOPHAGOGASTRODUODENOSCOPY (EGD);  Surgeon: Manya Silvas, MD;  Location: [facility] ENDOSCOPY;  Service: Endoscopy;  Laterality: N/A;  . ESOPHAGOGASTRODUODENOSCOPY N/A 06/27/2017   Procedure: ESOPHAGOGASTRODUODENOSCOPY (EGD);   Surgeon: Manya Silvas, MD;  Location: [facility] ENDOSCOPY;  Service: Endoscopy;  Laterality: N/A;  . ESOPHAGOGASTRODUODENOSCOPY N/A 06/28/2017   Procedure: ESOPHAGOGASTRODUODENOSCOPY (EGD);  Surgeon: Manya Silvas, MD;  Location: [facility] ENDOSCOPY;  Service: Endoscopy;  Laterality: N/A;  . ESOPHAGOGASTRODUODENOSCOPY Bilateral 10/11/2017   Procedure: ESOPHAGOGASTRODUODENOSCOPY (EGD);  Surgeon: Manya Silvas, MD;  Location: [facility] ENDOSCOPY;  Service: Endoscopy;  Laterality: Bilateral;  . ESOPHAGOGASTRODUODENOSCOPY N/A 12/04/2017   Procedure: ESOPHAGOGASTRODUODENOSCOPY (EGD);  Surgeon: Manya Silvas, MD;  Location: [facility] ENDOSCOPY;  Service: Endoscopy;  Laterality: N/A;  . ESOPHAGOGASTRODUODENOSCOPY N/A 03/06/2019   Procedure: ESOPHAGOGASTRODUODENOSCOPY (EGD);  Surgeon: Toledo, Benay Pike, MD;  Location: [facility] ENDOSCOPY;  Service: Gastroenterology;  Laterality: N/A;  . ESOPHAGOGASTRODUODENOSCOPY (EGD) WITH PROPOFOL N/A 07/20/2015   Procedure: ESOPHAGOGASTRODUODENOSCOPY (EGD) WITH PROPOFOL;  Surgeon: Manya Silvas, MD;  Location: [facility] ENDOSCOPY;  Service: Endoscopy;  Laterality: N/A;  . ESOPHAGOGASTRODUODENOSCOPY (EGD) WITH PROPOFOL N/A 09/16/2015   Procedure: ESOPHAGOGASTRODUODENOSCOPY (EGD) WITH PROPOFOL;  Surgeon: Manya Silvas, MD;  Location: [facility] ENDOSCOPY;  Service: Endoscopy;  Laterality: N/A;  . ESOPHAGOGASTRODUODENOSCOPY (EGD) WITH PROPOFOL N/A 03/16/2016   Procedure: ESOPHAGOGASTRODUODENOSCOPY (EGD) WITH PROPOFOL;  Surgeon: Manya Silvas, MD;  Location: [facility] ENDOSCOPY;  Service: Endoscopy;  Laterality: N/A;  . ESOPHAGOGASTRODUODENOSCOPY (EGD) WITH PROPOFOL N/A 09/14/2016   Procedure: ESOPHAGOGASTRODUODENOSCOPY (EGD)  WITH PROPOFOL;  Surgeon: Manya Silvas, MD;  Location: [facility] ENDOSCOPY;  Service: Endoscopy;  Laterality: N/A;  . ESOPHAGOGASTRODUODENOSCOPY (EGD) WITH PROPOFOL N/A 11/05/2016   Procedure: ESOPHAGOGASTRODUODENOSCOPY (EGD) WITH PROPOFOL;  Surgeon: Manya Silvas, MD;   Location: [facility] ENDOSCOPY;  Service: Endoscopy;  Laterality: N/A;  . ESOPHAGOGASTRODUODENOSCOPY (EGD) WITH PROPOFOL N/A 02/06/2017   Procedure: ESOPHAGOGASTRODUODENOSCOPY (EGD) WITH PROPOFOL;  Surgeon: Manya Silvas, MD;  Location: [facility] ENDOSCOPY;  Service: Endoscopy;  Laterality: N/A;  . ESOPHAGOGASTRODUODENOSCOPY (EGD) WITH PROPOFOL N/A 04/24/2017   Procedure: ESOPHAGOGASTRODUODENOSCOPY (EGD) WITH PROPOFOL;  Surgeon: Manya Silvas, MD;  Location: [facility] ENDOSCOPY;  Service: Endoscopy;  Laterality: N/A;  . ESOPHAGOGASTRODUODENOSCOPY (EGD) WITH PROPOFOL N/A 07/31/2017   Procedure: ESOPHAGOGASTRODUODENOSCOPY (EGD) WITH PROPOFOL;  Surgeon: Manya Silvas, MD;  Location: [facility] ENDOSCOPY;  Service: Endoscopy;  Laterality: N/A;  . ESOPHAGOGASTRODUODENOSCOPY (EGD) WITH PROPOFOL N/A 08/08/2018   Procedure: ESOPHAGOGASTRODUODENOSCOPY (EGD) WITH PROPOFOL;  Surgeon: Manya Silvas, MD;  Location: [facility] ENDOSCOPY;  Service: Endoscopy;  Laterality: N/A;  . ESOPHAGOGASTRODUODENOSCOPY (EGD) WITH PROPOFOL N/A 05/21/2020   Procedure: ESOPHAGOGASTRODUODENOSCOPY (EGD) WITH PROPOFOL;  Surgeon: [name] Manifold, MD;  Location: [facility] ENDOSCOPY;  Service: Endoscopy;  Laterality: N/A;  . GIVENS CAPSULE STUDY N/A 12/05/2017   Procedure: GIVENS CAPSULE STUDY;  Surgeon: Manya Silvas, MD;  Location: [facility] ENDOSCOPY;  Service: Endoscopy;  Laterality: N/A;  . TONSILLECTOMY    . TONSILLECTOMY AND ADENOIDECTOMY    . ULNAR NERVE TRANSPOSITION    . UVULOPALATOPHARYNGOPLASTY      Prior to Admission medications   Medication Sig Start Date End Date Taking? Authorizing Provider  atorvastatin (LIPITOR) 20 MG tablet Take 20 mg by mouth daily. 03/23/20   [provider]  ferrous sulfate 325 (65 FE) MG tablet Take 1 tablet (325 mg total) by mouth daily. 05/04/20   Nicole Kindred A, DO  hydrALAZINE (APRESOLINE) 50 MG tablet Take 1 tablet (50 mg total) by mouth 2 (two) times daily. 05/03/20   Ezekiel Slocumb, DO    Ipratropium-Albuterol (COMBIVENT RESPIMAT) 20-100 MCG/ACT AERS respimat Inhale 1 puff into the lungs every 6 (six) hours. 02/12/20   Loletha Grayer, MD  lactulose (CHRONULAC) 10 GM/15ML solution Take 90 mLs (60 g total) by mouth daily. Patient taking differently: Take 45 g by mouth 2 (two) times daily.  05/04/20   Ezekiel Slocumb, DO  LORazepam (ATIVAN) 0.5 MG tablet Take 1 tablet (0.5 mg total) by mouth 3 (three) times daily as needed for Anxiety    [provider]  metolazone (ZAROXOLYN) 5 MG tablet Take 5 mg by mouth daily. Taking every 3 days 05/13/20   [provider]  NOVOLOG MIX 70/30 FLEXPEN (70-30) 100 UNIT/ML FlexPen 80-84 Units as directed. Inject 84 Units Subcutaneous every morning at breakfast and 80 units at dinner 03/17/20   [provider]  pantoprazole (PROTONIX) 40 MG tablet Take 1 tablet (40 mg total) by mouth 2 (two) times daily. 03/06/19 06/01/20  Dustin Flock, MD  potassium chloride (KLOR-CON) 10 MEQ tablet Take 10 mEq by mouth daily. 04/21/20   [provider]  rifaximin (XIFAXAN) 550 MG TABS tablet Take 1 tablet (550 mg total) by mouth 2 (two) times daily. 02/12/20   Loletha Grayer, MD  sodium chloride (OCEAN) 0.65 % SOLN nasal spray Place 1 spray into both nostrils as needed for congestion (nose irritation). 02/12/20   Loletha Grayer, MD  tamsulosin (FLOMAX) 0.4 MG CAPS capsule Take 1 capsule (0.4 mg total) by mouth daily after supper. 05/03/20  Nicole Kindred A, DO  torsemide (DEMADEX) 100 MG tablet Take 1 tablet (100 mg total) by mouth daily. 05/04/20   Ezekiel Slocumb, DO    Allergies Patient has no known allergies.  Family History  Problem Relation Age of Onset  . Diabetes Other   . Transient ischemic attack Father   . CAD Father     Social History Social History   Tobacco Use  . Smoking status: Former Smoker    Years: 20.00    Types: Cigars, Cigarettes  . Smokeless tobacco: Never Used  Vaping Use  . Vaping Use: Never  used  Substance Use Topics  . Alcohol use: No    Comment: stopped 15 years ago  . Drug use: No    Review of Systems Unable to obtain secondary to altered mental status. ____________________________________________   PHYSICAL EXAM:  VITAL SIGNS: ED Triage Vitals  Enc Vitals Group     BP      Pulse      Resp      Temp      Temp src      SpO2      Weight      Height      Head Circumference      Peak Flow      Pain Score      Pain Loc      Pain Edu?      Excl. in [place]?     Constitutional: Alert and oriented to person, but not place or time. Eyes: Conjunctivae are normal.  Pupils equal round reactive to light bilaterally. Head: Atraumatic. Nose: No congestion/rhinnorhea. Mouth/Throat: Mucous membranes are moist. Neck: Normal ROM Cardiovascular: Normal rate, regular rhythm. Grossly normal heart sounds. Respiratory: Normal respiratory effort.  No retractions. Lungs CTAB. Gastrointestinal: Soft and diffusely tender to palpation. No distention. Genitourinary: deferred Musculoskeletal: 2+ pitting edema to knees bilaterally with overlying dermatitis, dressings in place. Neurologic:  Normal speech and language. No gross focal neurologic deficits are appreciated. Skin:  Skin is warm, dry and intact. No rash noted. Psychiatric: Mood and affect are normal. Speech and behavior are normal.  ____________________________________________   LABS (all labs ordered are listed, but only abnormal results are displayed)  Labs Reviewed  GLUCOSE, CAPILLARY - Abnormal; Notable for the following components:      Result Value   Glucose-Capillary 181 (*)    All other components within normal limits  CBC WITH DIFFERENTIAL/PLATELET - Abnormal; Notable for the following components:   RBC 2.36 (*)    Hemoglobin 7.4 (*)    HCT 22.5 (*)    RDW 16.6 (*)    Platelets 80 (*)    Lymphs Abs 0.3 (*)    All other components within normal limits  COMPREHENSIVE METABOLIC PANEL - Abnormal; Notable  for the following components:   Potassium 2.3 (*)    Chloride 96 (*)    Glucose, Bld 163 (*)    BUN 101 (*)    Creatinine, Ser 2.89 (*)    Calcium 8.7 (*)    Albumin 2.8 (*)    AST 46 (*)    GFR calc non Af Amer 21 (*)    GFR calc Af Amer 25 (*)    Anion gap 16 (*)    All other components within normal limits  AMMONIA - Abnormal; Notable for the following components:   Ammonia 103 (*)    All other components within normal limits  PROTIME-INR - Abnormal; Notable for the following components:  Prothrombin Time 16.2 (*)    INR 1.4 (*)    All other components within normal limits  TROPONIN I (HIGH SENSITIVITY) - Abnormal; Notable for the following components:   Troponin I (High Sensitivity) 36 (*)    All other components within normal limits  SARS CORONAVIRUS 2 BY RT PCR (HOSPITAL ORDER, [place] [facility])  MAGNESIUM  URINALYSIS, COMPLETE (UACMP) WITH MICROSCOPIC  TROPONIN I (HIGH SENSITIVITY)   ____________________________________________  EKG  ED ECG REPORT I, Blake Divine, the attending physician, personally viewed and interpreted this ECG.   Date: 06/06/2020  EKG Time: 21:49  Rate: 78  Rhythm: atrial fibrillation, rate 78  Axis: Normal  Intervals:none  ST&T Change: None  PROCEDURES  Procedure(s) performed (including Critical Care):  Procedures   ____________________________________________   INITIAL IMPRESSION / ASSESSMENT AND PLAN / ED COURSE       69 year old male with past medical history of hypertension, hyperlipidemia, cirrhosis with varices, CHF, atrial fibrillation, diabetes, CKD, and venous stasis dermatitis who presents to the ED for altered mental status and hypoglycemia.  Patient initially noted to be hypoglycemic by fire department but was improved on EMS arrival and remains improved here in the ED.  Patient does appear confused, is able to state his name and birth year but then repetitively answers questions with his birth  year.  He is disoriented to place and time but does not appear to have any focal deficits on exam.  We will check CT head but I suspect his altered mental status is secondary to elevated ammonia as this has been a recurrent issue for the patient in the past.  We will check ammonia level as well as CBC, CMP, chest x-ray, and UA.  On further examination patient noted to have significant swelling to the area of his right testicle along with lower abdominal tenderness.  We will perform bladder scan to ensure no urinary retention, while urine is freely flowing from his penis he may have overflow incontinence.  There is no subcutaneous emphysema, erythema, or warmth to suggest Fournier's gangrene.  We will perform CT abdomen/pelvis to further assess.  Labs remarkable for severe hypokalemia which we will replete with both p.o. and IV supplementation.  Patient also noted to have elevated ammonia, likely the source of his altered mental status and we will give dose of lactulose.  Patient turned over to oncoming provider pending CT results and admission for hepatic encephalopathy.      ____________________________________________   FINAL CLINICAL IMPRESSION(S) / ED DIAGNOSES  Final diagnoses:  Altered mental status, unspecified altered mental status type  Hypokalemia  Hepatic encephalopathy [facility])     ED Discharge Orders    None       Note:  This document was prepared using Dragon voice recognition software and may include unintentional dictation errors.   Blake Divine, MD 06/07/20 [phone]

## 2020-06-07 ENCOUNTER — Encounter: Payer: Self-pay | Admitting: Family Medicine

## 2020-06-07 DIAGNOSIS — R188 Other ascites: Secondary | ICD-10-CM | POA: Diagnosis present

## 2020-06-07 DIAGNOSIS — I130 Hypertensive heart and chronic kidney disease with heart failure and stage 1 through stage 4 chronic kidney disease, or unspecified chronic kidney disease: Secondary | ICD-10-CM | POA: Diagnosis present

## 2020-06-07 DIAGNOSIS — D638 Anemia in other chronic diseases classified elsewhere: Secondary | ICD-10-CM

## 2020-06-07 DIAGNOSIS — N184 Chronic kidney disease, stage 4 (severe): Secondary | ICD-10-CM | POA: Diagnosis present

## 2020-06-07 DIAGNOSIS — I480 Paroxysmal atrial fibrillation: Secondary | ICD-10-CM | POA: Diagnosis present

## 2020-06-07 DIAGNOSIS — K2270 Barrett's esophagus without dysplasia: Secondary | ICD-10-CM | POA: Diagnosis present

## 2020-06-07 DIAGNOSIS — J449 Chronic obstructive pulmonary disease, unspecified: Secondary | ICD-10-CM | POA: Diagnosis present

## 2020-06-07 DIAGNOSIS — K7290 Hepatic failure, unspecified without coma: Principal | ICD-10-CM

## 2020-06-07 DIAGNOSIS — I872 Venous insufficiency (chronic) (peripheral): Secondary | ICD-10-CM | POA: Diagnosis present

## 2020-06-07 DIAGNOSIS — E1122 Type 2 diabetes mellitus with diabetic chronic kidney disease: Secondary | ICD-10-CM | POA: Diagnosis present

## 2020-06-07 DIAGNOSIS — I509 Heart failure, unspecified: Secondary | ICD-10-CM | POA: Diagnosis present

## 2020-06-07 DIAGNOSIS — E162 Hypoglycemia, unspecified: Secondary | ICD-10-CM

## 2020-06-07 DIAGNOSIS — K7460 Unspecified cirrhosis of liver: Secondary | ICD-10-CM | POA: Diagnosis present

## 2020-06-07 DIAGNOSIS — R4182 Altered mental status, unspecified: Secondary | ICD-10-CM | POA: Diagnosis present

## 2020-06-07 DIAGNOSIS — R9431 Abnormal electrocardiogram [ECG] [EKG]: Secondary | ICD-10-CM

## 2020-06-07 DIAGNOSIS — E11649 Type 2 diabetes mellitus with hypoglycemia without coma: Secondary | ICD-10-CM | POA: Diagnosis present

## 2020-06-07 DIAGNOSIS — Z794 Long term (current) use of insulin: Secondary | ICD-10-CM | POA: Diagnosis not present

## 2020-06-07 DIAGNOSIS — Z79899 Other long term (current) drug therapy: Secondary | ICD-10-CM | POA: Diagnosis not present

## 2020-06-07 DIAGNOSIS — F419 Anxiety disorder, unspecified: Secondary | ICD-10-CM | POA: Diagnosis present

## 2020-06-07 DIAGNOSIS — Z8249 Family history of ischemic heart disease and other diseases of the circulatory system: Secondary | ICD-10-CM | POA: Diagnosis not present

## 2020-06-07 DIAGNOSIS — K922 Gastrointestinal hemorrhage, unspecified: Secondary | ICD-10-CM | POA: Diagnosis present

## 2020-06-07 DIAGNOSIS — Z20822 Contact with and (suspected) exposure to covid-19: Secondary | ICD-10-CM | POA: Diagnosis present

## 2020-06-07 DIAGNOSIS — Z6841 Body Mass Index (BMI) 40.0 and over, adult: Secondary | ICD-10-CM | POA: Diagnosis not present

## 2020-06-07 DIAGNOSIS — Z833 Family history of diabetes mellitus: Secondary | ICD-10-CM | POA: Diagnosis not present

## 2020-06-07 DIAGNOSIS — E876 Hypokalemia: Secondary | ICD-10-CM | POA: Diagnosis present

## 2020-06-07 DIAGNOSIS — N5089 Other specified disorders of the male genital organs: Secondary | ICD-10-CM | POA: Diagnosis present

## 2020-06-07 DIAGNOSIS — E785 Hyperlipidemia, unspecified: Secondary | ICD-10-CM | POA: Diagnosis present

## 2020-06-07 DIAGNOSIS — K219 Gastro-esophageal reflux disease without esophagitis: Secondary | ICD-10-CM | POA: Diagnosis present

## 2020-06-07 LAB — CBC
HCT: 22 % — ABNORMAL LOW (ref 39.0–52.0)
Hemoglobin: 7.6 g/dL — ABNORMAL LOW (ref 13.0–17.0)
MCH: 32.2 pg (ref 26.0–34.0)
MCHC: 34.5 g/dL (ref 30.0–36.0)
MCV: 93.2 fL (ref 80.0–100.0)
Platelets: 80 10*3/uL — ABNORMAL LOW (ref 150–400)
RBC: 2.36 MIL/uL — ABNORMAL LOW (ref 4.22–5.81)
RDW: 16.9 % — ABNORMAL HIGH (ref 11.5–15.5)
WBC: 5.3 10*3/uL (ref 4.0–10.5)
nRBC: 0 % (ref 0.0–0.2)

## 2020-06-07 LAB — URINALYSIS, COMPLETE (UACMP) WITH MICROSCOPIC
Bacteria, UA: NONE SEEN
Bilirubin Urine: NEGATIVE
Glucose, UA: NEGATIVE mg/dL
Hgb urine dipstick: NEGATIVE
Ketones, ur: NEGATIVE mg/dL
Leukocytes,Ua: NEGATIVE
Nitrite: NEGATIVE
Protein, ur: 30 mg/dL — AB
Specific Gravity, Urine: 1.006 (ref 1.005–1.030)
pH: 6 (ref 5.0–8.0)

## 2020-06-07 LAB — BASIC METABOLIC PANEL
Anion gap: 15 (ref 5–15)
BUN: 100 mg/dL — ABNORMAL HIGH (ref 8–23)
CO2: 29 mmol/L (ref 22–32)
Calcium: 8.9 mg/dL (ref 8.9–10.3)
Chloride: 97 mmol/L — ABNORMAL LOW (ref 98–111)
Creatinine, Ser: 2.95 mg/dL — ABNORMAL HIGH (ref 0.61–1.24)
GFR calc Af Amer: 24 mL/min — ABNORMAL LOW (ref >60)
GFR calc non Af Amer: 21 mL/min — ABNORMAL LOW (ref >60)
Glucose, Bld: 156 mg/dL — ABNORMAL HIGH (ref 70–99)
Potassium: 2.6 mmol/L — CL (ref 3.5–5.1)
Sodium: 141 mmol/L (ref 135–145)

## 2020-06-07 LAB — CREATININE, SERUM
Creatinine, Ser: 2.87 mg/dL — ABNORMAL HIGH (ref 0.61–1.24)
GFR calc Af Amer: 25 mL/min — ABNORMAL LOW (ref >60)
GFR calc non Af Amer: 22 mL/min — ABNORMAL LOW (ref >60)

## 2020-06-07 LAB — GLUCOSE, CAPILLARY
Glucose-Capillary: 179 mg/dL — ABNORMAL HIGH (ref 70–99)
Glucose-Capillary: 156 mg/dL — ABNORMAL HIGH (ref 70–99)
Glucose-Capillary: 137 mg/dL — ABNORMAL HIGH (ref 70–99)
Glucose-Capillary: 130 mg/dL — ABNORMAL HIGH (ref 70–99)
Glucose-Capillary: 125 mg/dL — ABNORMAL HIGH (ref 70–99)

## 2020-06-07 LAB — TROPONIN I (HIGH SENSITIVITY): Troponin I (High Sensitivity): 43 ng/L — ABNORMAL HIGH (ref <18)

## 2020-06-07 LAB — AMMONIA: Ammonia: 140 umol/L — ABNORMAL HIGH (ref 9–35)

## 2020-06-07 LAB — POTASSIUM: Potassium: 2.5 mmol/L — CL (ref 3.5–5.1)

## 2020-06-07 LAB — SARS CORONAVIRUS 2 BY RT PCR (HOSPITAL ORDER, PERFORMED IN ~~LOC~~ HOSPITAL LAB): SARS Coronavirus 2: NEGATIVE

## 2020-06-07 MED ORDER — INSULIN ASPART 100 UNIT/ML ~~LOC~~ SOLN
0.0000 [IU] | Freq: Three times a day (TID) | SUBCUTANEOUS | Status: DC
  Administered 2020-06-07: 1 [IU] via SUBCUTANEOUS
  Administered 2020-06-07 – 2020-06-08 (×2): 2 [IU] via SUBCUTANEOUS
  Administered 2020-06-08: 3 [IU] via SUBCUTANEOUS
  Administered 2020-06-08: 1 [IU] via SUBCUTANEOUS
  Administered 2020-06-09: 2 [IU] via SUBCUTANEOUS
  Administered 2020-06-09 (×2): 3 [IU] via SUBCUTANEOUS
  Administered 2020-06-10: 2 [IU] via SUBCUTANEOUS
  Administered 2020-06-10 (×2): 3 [IU] via SUBCUTANEOUS
  Administered 2020-06-11: 2 [IU] via SUBCUTANEOUS
  Filled 2020-06-07 (×11): qty 1

## 2020-06-07 MED ORDER — HALOPERIDOL LACTATE 5 MG/ML IJ SOLN
2.0000 mg | Freq: Four times a day (QID) | INTRAMUSCULAR | Status: DC | PRN
  Administered 2020-06-07: 2 mg via INTRAVENOUS
  Filled 2020-06-07: qty 1

## 2020-06-07 MED ORDER — LACTULOSE 10 GM/15ML PO SOLN: 45.0000 g | Freq: Two times a day (BID) | ORAL | Status: DC

## 2020-06-07 MED ORDER — METOLAZONE 5 MG PO TABS
5.0000 mg | ORAL_TABLET | Freq: Every day | ORAL | Status: DC
  Filled 2020-06-07: qty 1

## 2020-06-07 MED ORDER — TORSEMIDE 100 MG PO TABS
100.0000 mg | ORAL_TABLET | Freq: Every day | ORAL | Status: DC
  Administered 2020-06-08 – 2020-06-11 (×4): 100 mg via ORAL
  Filled 2020-06-07 (×4): qty 1

## 2020-06-07 MED ORDER — HYDRALAZINE HCL 50 MG PO TABS
50.0000 mg | ORAL_TABLET | Freq: Two times a day (BID) | ORAL | Status: DC
  Administered 2020-06-07 – 2020-06-11 (×8): 50 mg via ORAL
  Filled 2020-06-07 (×8): qty 1

## 2020-06-07 MED ORDER — ATORVASTATIN CALCIUM 20 MG PO TABS
20.0000 mg | ORAL_TABLET | Freq: Every day | ORAL | Status: DC
  Administered 2020-06-07 – 2020-06-11 (×5): 20 mg via ORAL
  Filled 2020-06-07 (×5): qty 1

## 2020-06-07 MED ORDER — LACTULOSE ENEMA
300.0000 mL | Freq: Two times a day (BID) | ORAL | Status: DC
  Filled 2020-06-07 (×6): qty 300

## 2020-06-07 MED ORDER — LACTULOSE 10 GM/15ML PO SOLN
45.0000 g | Freq: Two times a day (BID) | ORAL | Status: DC
  Administered 2020-06-07: 45 g via ORAL
  Filled 2020-06-07: qty 90

## 2020-06-07 MED ORDER — SODIUM CHLORIDE 0.9 % IV SOLN: INTRAVENOUS | Status: DC

## 2020-06-07 MED ORDER — PROMETHAZINE HCL 25 MG PO TABS
12.5000 mg | ORAL_TABLET | Freq: Four times a day (QID) | ORAL | Status: DC | PRN
  Filled 2020-06-07: qty 1

## 2020-06-07 MED ORDER — TORSEMIDE 100 MG PO TABS: 100.0000 mg | ORAL_TABLET | Freq: Every day | ORAL | Status: DC

## 2020-06-07 MED ORDER — LORAZEPAM 2 MG/ML IJ SOLN
1.0000 mg | Freq: Once | INTRAMUSCULAR | Status: AC
  Administered 2020-06-07: 1 mg via INTRAVENOUS
  Filled 2020-06-07: qty 1

## 2020-06-07 MED ORDER — METOLAZONE 5 MG PO TABS
5.0000 mg | ORAL_TABLET | Freq: Every day | ORAL | Status: DC
  Administered 2020-06-08 – 2020-06-11 (×4): 5 mg via ORAL
  Filled 2020-06-07 (×4): qty 1

## 2020-06-07 MED ORDER — POTASSIUM CHLORIDE CRYS ER 20 MEQ PO TBCR
40.0000 meq | EXTENDED_RELEASE_TABLET | Freq: Once | ORAL | Status: DC
  Filled 2020-06-07: qty 2

## 2020-06-07 MED ORDER — ACETAMINOPHEN 650 MG RE SUPP: 650.0000 mg | Freq: Four times a day (QID) | RECTAL | Status: DC | PRN

## 2020-06-07 MED ORDER — HEPARIN SODIUM (PORCINE) 5000 UNIT/ML IJ SOLN
5000.0000 [IU] | Freq: Three times a day (TID) | INTRAMUSCULAR | Status: DC
  Administered 2020-06-07 – 2020-06-09 (×7): 5000 [IU] via SUBCUTANEOUS
  Filled 2020-06-07 (×7): qty 1

## 2020-06-07 MED ORDER — FERROUS SULFATE 325 (65 FE) MG PO TABS
325.0000 mg | ORAL_TABLET | Freq: Every day | ORAL | Status: DC
  Administered 2020-06-07 – 2020-06-11 (×5): 325 mg via ORAL
  Filled 2020-06-07 (×5): qty 1

## 2020-06-07 MED ORDER — ACETAMINOPHEN 325 MG PO TABS: 650.0000 mg | ORAL_TABLET | Freq: Four times a day (QID) | ORAL | Status: DC | PRN

## 2020-06-07 MED ORDER — TAMSULOSIN HCL 0.4 MG PO CAPS
0.4000 mg | ORAL_CAPSULE | Freq: Every day | ORAL | Status: DC
  Administered 2020-06-08 – 2020-06-10 (×3): 0.4 mg via ORAL
  Filled 2020-06-07 (×4): qty 1

## 2020-06-07 MED ORDER — POTASSIUM CHLORIDE 10 MEQ/100ML IV SOLN
10.0000 meq | INTRAVENOUS | Status: AC
  Administered 2020-06-07: 10 meq via INTRAVENOUS
  Filled 2020-06-07 (×2): qty 100

## 2020-06-07 MED ORDER — IPRATROPIUM-ALBUTEROL 0.5-2.5 (3) MG/3ML IN SOLN
3.0000 mL | Freq: Four times a day (QID) | RESPIRATORY_TRACT | Status: DC
  Administered 2020-06-07 (×3): 3 mL via RESPIRATORY_TRACT
  Filled 2020-06-07 (×3): qty 3

## 2020-06-07 MED ORDER — POTASSIUM CHLORIDE 10 MEQ/100ML IV SOLN
10.0000 meq | Freq: Once | INTRAVENOUS | Status: AC
  Administered 2020-06-07: 10 meq via INTRAVENOUS

## 2020-06-07 MED ORDER — RIFAXIMIN 550 MG PO TABS
550.0000 mg | ORAL_TABLET | Freq: Two times a day (BID) | ORAL | Status: DC
  Administered 2020-06-07 – 2020-06-11 (×8): 550 mg via ORAL
  Filled 2020-06-07 (×11): qty 1

## 2020-06-07 MED ORDER — PANTOPRAZOLE SODIUM 40 MG PO TBEC
40.0000 mg | DELAYED_RELEASE_TABLET | Freq: Two times a day (BID) | ORAL | Status: DC
  Administered 2020-06-07 – 2020-06-11 (×8): 40 mg via ORAL
  Filled 2020-06-07 (×8): qty 1

## 2020-06-07 NOTE — Progress Notes (Signed)
PT Cancellation Note  Patient Details Name: Corey West MRN: [id number] DOB: 1950/10/03   Cancelled Treatment:    Reason Eval/Treat Not Completed: Other (comment) Per chart review, pt's potassium levels at 2.5, which is outside of therapeutic window for treatment. Will hold at this time and attempt when appropriate for therapy.   Vale Haven 06/07/2020, 11:32 AM

## 2020-06-07 NOTE — ED Notes (Signed)
Pt again screaming in room "mam" "help me". When entering room pt wants to get up and attempts to throw leg over railing but is unable to. Pt wants to get up but is not able to say anything else other than "help me get up."

## 2020-06-07 NOTE — ED Notes (Signed)
Attempting to pain Rufina Falco, NP to report critical K at 2.6

## 2020-06-07 NOTE — Progress Notes (Signed)
OT Cancellation Note  Patient Details Name: Corey West MRN: [id number] DOB: Mar 02, 1951   Cancelled Treatment:    Reason Eval/Treat Not Completed: Medical issues which prohibited therapy. Consult received, chart reviewed. Pt noted with K+ 2.6, 2.5 this am. Pt contraindicated for participation in therapy at this time. Will re-attempt at later date/time as pt is medically appropriate and agreeable.  Jeni Salles, MPH, MS, OTR/L ascom [phone] 06/07/20, 11:00 AM

## 2020-06-07 NOTE — Progress Notes (Signed)
PROGRESS NOTE    Patient: Corey West                            PCP: Leonel Ramsay, MD                    DOB: 05/27/1951            DOA: 06/06/2020 BBC:[id number]             DOS: 06/07/2020, 8:08 AM   LOS: 0 days   Date of Service: The patient was seen and examined on 06/07/2020  Subjective:   The patient was seen and examined this Am. Awake alert but still confused. Hemodynamically stable Otherwise no issues overnight .  Brief Narrative:   HPI:  Corey West is a 69 y.o. male with medical history significant for hypertension, hyperlipidemia, cirrhosis with varices, CHF, atrial fibrillation, diabetes mellitus type 2, CKD, and venous stasis dermatitis of lower extremities who presents to the ED for altered mental status.  History was limited due to confusion encephalopathy. Apparently patient lives alone--EMS found him hypoglycemic blood sugars of 27 on the field--- blood sugar improved 307  ED Course:   Elevated ammonia level over 100 in the emergency room.   He also has ascites and scrotal swelling on CT scan with a right inguinal hernia.  CT the head is negative.   Has not had further hypoglycemia in the emergency room.    Assessment & Plan:   Principal Problem:   Hepatic encephalopathy (HCC) Active Problems:   Cirrhosis (HCC)   Hypokalemia   Obesity, Class III, BMI 40-49.9 (morbid obesity) (HCC)   Type II diabetes mellitus with renal manifestations (HCC)   CKD (chronic kidney disease) stage 4, GFR 15-29 ml/min (HCC)   Paroxysmal atrial fibrillation (HCC)   Anemia of chronic disease   Prolonged QT interval   Hypoglycemia   Ascites   Hepatic encephalopathy ([place]) -Patient is very sensitive to ammonia level currently greater than 100, encephalopathic -  hepatic encephalopathy with ammonia level greater than 100. -  Patient was given lactulose in ED....  -We will continue continue lactulose and home medication of Xifaxan. -We will change lactulose to rectal  enema -We will monitor ammonia level closely  Severe retractive hypokalemia -On admission 2.3, post IV and oral potassium this morning potassium still at 2.5 -Reattempting oral potassium -Rechecking potassium -Monitoring magnesium and repleting accordingly 2 g already been given   Active Problems:    Cirrhosis with ascites -Seems to be chronic, -We will monitor LFTs closely along with ammonia level     Type II diabetes mellitus with renal manifestations (HCC) -Last A1c 6.0, few weeks ago -We will continue check blood sugars q. CHS, SSI coverage -Mild hyperglycemia, monitoring    CKD (chronic kidney disease) stage 4, GFR 15-29 ml/min (HCC) Stable chronic kidney disease stage IV -On admission creatinine 2.89 >>> 2.87 -Monitoring BUN/creatinine closely, avoiding nephrotoxins    Paroxysmal atrial fibrillation ([place]) -Patient with chronic paroxysmal atrial fibrillation. -Not on anticoagulation due to recurrent GI bleed, falls,  - Prolonged QT interval Avoid medications that will further prolong QTc.    Anemia of chronic disease -CKD -chronic GI bleed -Monitoring H&H -hemoglobin 7.4, 7.6 -No active site of bleeding  Chronic GI bleed  -due to varices,GAVE syndrome was ruled out on last imaging) -Monitoring H&H -Patient was transfused on last admission -Continue Protonix  Hypertension -Stable continue home medication of  hydralazine  Hyperlipidemia -Continue statin   dCHF -with chronic lower extremity edema/venous stasis -Chronic -Continue home medication of Demadex and Zaroxolyn -Monitoring daily weight I's and O's   GERD Continue PPI     Obesity, Class III, BMI 40-49.9 (morbid obesity) ([place])  patient to follow-up with PCP for dietary last interventions for weight loss       Consultants:  None   --------------------------------------------------------------------------------------------------------------------------------------------     DVT prophylaxis:      [place] score elevated.  Heparin for DVT prophylaxis Code Status:              Full code Family Communication:       No family is at bedside. Disposition Plan:              Patient is from:                        Home             Anticipated DC to:                   Home versus SNF             Anticipated DC date:               Anticipate greater than 2 midnight stay in the hospital to treat acute medical condition             Anticipated DC barriers:         Mr. Gassett will need discharge planning evaluation and physical therapy evaluation to see if there is need for SNF                                                                placement   Admission status:     Inpatient         Procedures:   No admission procedures for hospital encounter.     Antimicrobials:  Anti-infectives (From admission, onward)   Start     Dose/Rate Route Frequency Ordered Stop   06/07/20 0300  rifaximin (XIFAXAN) tablet 550 mg        550 mg Oral 2 times daily 06/07/20 0256         Medication:  . atorvastatin  20 mg Oral Daily  . ferrous sulfate  325 mg Oral Daily  . heparin  5,000 Units Subcutaneous Q8H  . hydrALAZINE  50 mg Oral BID  . insulin aspart  0-9 Units Subcutaneous TID WC  . ipratropium-albuterol  3 mL Nebulization Q6H  . lactulose  45 g Oral BID  . metolazone  5 mg Oral Daily  . pantoprazole  40 mg Oral BID  . potassium chloride  40 mEq Oral Once  . rifaximin  550 mg Oral BID  . tamsulosin  0.4 mg Oral QPC supper  . torsemide  100 mg Oral Daily    acetaminophen **OR** acetaminophen, promethazine   Objective:   Vitals:   06/07/20 0300 06/07/20 0430 06/07/20 0530 06/07/20 0630  BP: (!) 158/54 (!) 165/72 (!) 152/68 (!) 158/71  Pulse: 85 78 69 89  Resp: 20 16 17 18   Temp:      TempSrc:       SpO2: 99% 98% 95%  99%  Weight:      Height:       No intake or output data in the 24 hours ending 06/07/20 0808 Filed Weights   06/06/20 2203  Weight: 128.3 kg     Examination:   Physical Exam  Constitution: Awake alert confused Psychiatric: Normal and stable mood and affect, cognition intact,   HEENT: Normocephalic, PERRL, otherwise with in Normal limits  Chest:Chest symmetric Cardio vascular:  S1/S2, RRR, No murmure, No Rubs or Gallops  pulmonary: Clear to auscultation bilaterally, respirations unlabored, negative wheezes / crackles Abdomen: Soft, non-tender, non-distended, bowel sounds,no masses, no organomegaly Muscular skeletal: Limited exam - in bed, able to move all 4 extremities, Normal strength,  Neuro: CNII-XII intact. , normal motor and sensation, reflexes intact  Extremities:  +2 pitting edema lower extremities, +2 pulses  Skin: Dry, warm to touch, negative for any Rashes, No open wounds Wounds: per nursing documentation    ------------------------------------------------------------------------------------------------------------------------------------------    LABs:  CBC Latest Ref Rng & Units 06/07/2020 06/06/2020 06/02/2020  WBC 4.0 - 10.5 K/uL 5.3 5.2 4.8  Hemoglobin 13.0 - 17.0 g/dL 7.6(L) 7.4(L) 7.6(L)  Hematocrit 39 - 52 % 22.0(L) 22.5(L) 22.3(L)  Platelets 150 - 400 K/uL 80(L) 80(L) 79(L)   CMP Latest Ref Rng & Units 06/07/2020 06/07/2020 06/07/2020  Glucose 70 - 99 mg/dL - - 156(H)  BUN 8 - 23 mg/dL - - 100(H)  Creatinine 0.61 - 1.24 mg/dL - 2.87(H) 2.95(H)  Sodium 135 - 145 mmol/L - - 141  Potassium 3.5 - 5.1 mmol/L 2.5(LL) - 2.6(LL)  Chloride 98 - 111 mmol/L - - 97(L)  CO2 22 - 32 mmol/L - - 29  Calcium 8.9 - 10.3 mg/dL - - 8.9  Total Protein 6.5 - 8.1 g/dL - - -  Total Bilirubin 0.3 - 1.2 mg/dL - - -  Alkaline Phos 38 - 126 U/L - - -  AST 15 - 41 U/L - - -  ALT 0 - 44 U/L - - -       Micro Results Recent Results (from the past 240 hour(s))   SARS Coronavirus 2 by RT PCR (hospital order, performed in [facility] hospital lab) Nasopharyngeal Nasopharyngeal Swab     Status: None   Collection Time: 06/06/20 10:10 PM   Specimen: Nasopharyngeal Swab  Result Value Ref Range Status   SARS Coronavirus 2 NEGATIVE NEGATIVE Final    Comment: (NOTE) SARS-CoV-2 target nucleic acids are NOT DETECTED.  The SARS-CoV-2 RNA is generally detectable in upper and lower respiratory specimens during the acute phase of infection. The lowest concentration of SARS-CoV-2 viral copies this assay can detect is 250 copies / mL. A negative result does not preclude SARS-CoV-2 infection and should not be used as the sole basis for treatment or other patient management decisions.  A negative result may occur with improper specimen collection / handling, submission of specimen other than nasopharyngeal swab, presence of viral mutation(s) within the areas targeted by this assay, and inadequate number of viral copies (<250 copies / mL). A negative result must be combined with clinical observations, patient history, and epidemiological information.  Fact Sheet for Patients:   StrictlyIdeas.no  Fact Sheet for Healthcare Providers: BankingDealers.co.za  This test is not yet approved or  cleared by the [place] FDA and has been authorized for detection and/or diagnosis of SARS-CoV-2 by FDA under an Emergency Use Authorization (EUA).  This EUA will remain in effect (meaning this test can be used) for the duration of the  COVID-19 declaration under Section 564(b)(1) of the Act, 21 U.S.C. section 360bbb-3(b)(1), unless the authorization is terminated or revoked sooner.  Performed at [facility], [address], [place], [place] [address]     Radiology Reports CT Abdomen Pelvis Wo Contrast  Result Date: 06/07/2020 CLINICAL DATA:  Abdominal pain EXAM: CT ABDOMEN AND PELVIS WITHOUT CONTRAST  TECHNIQUE: Multidetector CT imaging of the abdomen and pelvis was performed following the standard protocol without IV contrast. COMPARISON:  04/23/2020 FINDINGS: Lower chest: Coronary artery and valvular calcifications. Low-density blood pool compatible with anemia. No acute abnormality. Hepatobiliary: Changes of cirrhosis. Scattered calcifications in the spleen. Small layering gallstones within the gallbladder, unchanged. Pancreas: No focal abnormality or ductal dilatation. Spleen: Splenomegaly, stable. Adrenals/Urinary Tract: No adrenal abnormality. No focal renal abnormality. No stones or hydronephrosis. Urinary bladder is unremarkable. Stomach/Bowel: Stomach, large and small bowel grossly unremarkable. Vascular/Lymphatic: Aortic atherosclerosis. No evidence of aneurysm or adenopathy. 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 Reproductive: Prostate calcifications. Other: Small amount of perihepatic, perisplenic and pelvic ascites. Moderate to large sized right inguinal hernia containing ascites. Musculoskeletal: No acute bony abnormality. IMPRESSION: Changes of cirrhosis.  Associated splenomegaly and ascites. Coronary artery disease, aortic atherosclerosis. Cholelithiasis. Moderate to large-sized right inguinal hernia containing ascites. Electronically Signed   By: Rolm Baptise M.D.   On: 06/07/2020 00:08   CT Head Wo Contrast  Result Date: 06/06/2020 CLINICAL DATA:  Mental status changes EXAM: CT HEAD WITHOUT CONTRAST TECHNIQUE: Contiguous axial images were obtained from the base of the skull through the vertex without intravenous contrast. COMPARISON:  01/28/2020 FINDINGS: Brain: No acute intracranial abnormality. Specifically, no hemorrhage, hydrocephalus, mass lesion, acute infarction, or significant intracranial injury. Vascular: No hyperdense vessel or unexpected calcification. Skull: No acute calvarial abnormality. Sinuses/Orbits: Visualized paranasal  sinuses and mastoids clear. Orbital soft tissues unremarkable. Other: None IMPRESSION: Normal study. Electronically Signed   By: Rolm Baptise M.D.   On: 06/06/2020 22:33   DG Chest Portable 1 View  Result Date: 06/06/2020 CLINICAL DATA:  Altered mental status EXAM: PORTABLE CHEST 1 VIEW COMPARISON:  04/30/2020 FINDINGS: Heart is borderline in size. Interstitial prominence throughout the lungs. No effusions or acute bony abnormality. IMPRESSION: Diffuse interstitial prominence throughout the lungs could reflect interstitial edema or atypical infection. Electronically Signed   By: Rolm Baptise M.D.   On: 06/06/2020 22:32    SIGNED: Deatra Saal, MD, FACP, FHM. [facility],  Pager (please use [email or web address] to page/text)  If 7PM-7AM, please contact night-coverage [email or web address], Password [email or web address] 06/07/2020, 8:08 AM

## 2020-06-07 NOTE — H&P (Signed)
History and Physical    KANAV KAZMIERCZAK PPI:[id number] DOB: 1951-09-18 DOA: 06/06/2020  PCP: Leonel Ramsay, MD   Patient coming from: home  Chief Complaint: Confusion  HPI: Corey West is a 69 y.o. male with medical history significant for hypertension, hyperlipidemia, cirrhosis with varices, CHF, atrial fibrillation, diabetes mellitus type 2, CKD, and venous stasis dermatitis of lower extremities who presents to the ED for altered mental status.  History is limited due to patient's confusion. Presented by EMS. Pt lives alone. Pt was confused when found by fire department with low blood sugar of 27 and was corrected at house by fire department and blood sugar was improved to 307 by time EMS arrived.    Patient remained confused and so he was brought to the ED for further evaluation.  Patient currently denies any complaints, states he feels fine but frequently repeats answers to questions.  He is able to state his name and knows he is in ER but is unsure of year or his date of birth. He has not had fever. He denies chest pain or palpitations. He denies falling or head trauma. He denies vision change at this time. He states he is not sure what happened today that led to him coming to hospital.   ED Course: Mr. Poehler found to have an elevated ammonia level over 100 in the emergency room.  He also has ascites and scrotal swelling on CT scan with a right inguinal hernia.  CT the head is negative.  Has not had further hypoglycemia in the emergency room.  Hospital service asked to admit for further management of hepatic encephalopathy  Review of Systems:  Accurate review of systems cannot be obtained from patient secondary to altered mental status within hepatic encephalopathy  Past Medical History:  Diagnosis Date  . Anemia   . Anxiety    controlled;   . Arthritis   . AVM (arteriovenous malformation) of stomach, acquired with hemorrhage   . Barrett's esophagus   . CHF (congestive heart failure)  ([place])   . Chronic kidney disease    renal infufficiency  . Cirrhosis ([place])   . COPD (chronic obstructive pulmonary disease) ([place])   . Depression    controlled;   Marland Kitchen Diabetes mellitus without complication ([place])    not controlled, taking insulin but sugar continues to run high;   Marland Kitchen Dysrhythmia   . Edema   . Esophageal varices ([place])   . GAVE (gastric antral vascular ectasia)   . GERD (gastroesophageal reflux disease)   . History of hiatal hernia   . Hyperlipidemia   . Hypertension    controlled well;   . Liver cirrhosis ([place])   . Nephropathy, diabetic ([place])   . Obesity   . Pancytopenia ([place])   . Polyp, stomach    with chronic blood loss  . Sleep apnea    does not wear a cpap, Medicare would not pay for it   . Venous stasis dermatitis of both lower extremities   . Venous stasis of both lower extremities    with cellulitis    Past Surgical History:  Procedure Laterality Date  . ESOPHAGOGASTRODUODENOSCOPY N/A 02/09/2015   Procedure: ESOPHAGOGASTRODUODENOSCOPY (EGD);  Surgeon: Manya Silvas, MD;  Location: [facility] ENDOSCOPY;  Service: Endoscopy;  Laterality: N/A;  . ESOPHAGOGASTRODUODENOSCOPY N/A 07/22/2015   Procedure: ESOPHAGOGASTRODUODENOSCOPY (EGD);  Surgeon: Manya Silvas, MD;  Location: [facility] ENDOSCOPY;  Service: Endoscopy;  Laterality: N/A;  . ESOPHAGOGASTRODUODENOSCOPY N/A 06/26/2017   Procedure: ESOPHAGOGASTRODUODENOSCOPY (EGD);  Surgeon: Manya Silvas, MD;  Location: [facility] ENDOSCOPY;  Service: Endoscopy;  Laterality: N/A;  . ESOPHAGOGASTRODUODENOSCOPY N/A 06/27/2017   Procedure: ESOPHAGOGASTRODUODENOSCOPY (EGD);  Surgeon: Manya Silvas, MD;  Location: [facility] ENDOSCOPY;  Service: Endoscopy;  Laterality: N/A;  . ESOPHAGOGASTRODUODENOSCOPY N/A 06/28/2017   Procedure: ESOPHAGOGASTRODUODENOSCOPY (EGD);  Surgeon: Manya Silvas, MD;  Location: [facility] ENDOSCOPY;  Service: Endoscopy;  Laterality: N/A;  . ESOPHAGOGASTRODUODENOSCOPY Bilateral 10/11/2017   Procedure:  ESOPHAGOGASTRODUODENOSCOPY (EGD);  Surgeon: Manya Silvas, MD;  Location: [facility] ENDOSCOPY;  Service: Endoscopy;  Laterality: Bilateral;  . ESOPHAGOGASTRODUODENOSCOPY N/A 12/04/2017   Procedure: ESOPHAGOGASTRODUODENOSCOPY (EGD);  Surgeon: Manya Silvas, MD;  Location: [facility] ENDOSCOPY;  Service: Endoscopy;  Laterality: N/A;  . ESOPHAGOGASTRODUODENOSCOPY N/A 03/06/2019   Procedure: ESOPHAGOGASTRODUODENOSCOPY (EGD);  Surgeon: Tol[place], Benay Pike, MD;  Location: [facility] ENDOSCOPY;  Service: Gastroenterology;  Laterality: N/A;  . ESOPHAGOGASTRODUODENOSCOPY (EGD) WITH PROPOFOL N/A 07/20/2015   Procedure: ESOPHAGOGASTRODUODENOSCOPY (EGD) WITH PROPOFOL;  Surgeon: Manya Silvas, MD;  Location: [facility] ENDOSCOPY;  Service: Endoscopy;  Laterality: N/A;  . ESOPHAGOGASTRODUODENOSCOPY (EGD) WITH PROPOFOL N/A 09/16/2015   Procedure: ESOPHAGOGASTRODUODENOSCOPY (EGD) WITH PROPOFOL;  Surgeon: Manya Silvas, MD;  Location: [facility] ENDOSCOPY;  Service: Endoscopy;  Laterality: N/A;  . ESOPHAGOGASTRODUODENOSCOPY (EGD) WITH PROPOFOL N/A 03/16/2016   Procedure: ESOPHAGOGASTRODUODENOSCOPY (EGD) WITH PROPOFOL;  Surgeon: Manya Silvas, MD;  Location: [facility] ENDOSCOPY;  Service: Endoscopy;  Laterality: N/A;  . ESOPHAGOGASTRODUODENOSCOPY (EGD) WITH PROPOFOL N/A 09/14/2016   Procedure: ESOPHAGOGASTRODUODENOSCOPY (EGD) WITH PROPOFOL;  Surgeon: Manya Silvas, MD;  Location: [facility] ENDOSCOPY;  Service: Endoscopy;  Laterality: N/A;  . ESOPHAGOGASTRODUODENOSCOPY (EGD) WITH PROPOFOL N/A 11/05/2016   Procedure: ESOPHAGOGASTRODUODENOSCOPY (EGD) WITH PROPOFOL;  Surgeon: Manya Silvas, MD;  Location: [facility] ENDOSCOPY;  Service: Endoscopy;  Laterality: N/A;  . ESOPHAGOGASTRODUODENOSCOPY (EGD) WITH PROPOFOL N/A 02/06/2017   Procedure: ESOPHAGOGASTRODUODENOSCOPY (EGD) WITH PROPOFOL;  Surgeon: Manya Silvas, MD;  Location: [facility] ENDOSCOPY;  Service: Endoscopy;  Laterality: N/A;  . ESOPHAGOGASTRODUODENOSCOPY (EGD) WITH PROPOFOL N/A 04/24/2017    Procedure: ESOPHAGOGASTRODUODENOSCOPY (EGD) WITH PROPOFOL;  Surgeon: Manya Silvas, MD;  Location: [facility] ENDOSCOPY;  Service: Endoscopy;  Laterality: N/A;  . ESOPHAGOGASTRODUODENOSCOPY (EGD) WITH PROPOFOL N/A 07/31/2017   Procedure: ESOPHAGOGASTRODUODENOSCOPY (EGD) WITH PROPOFOL;  Surgeon: Manya Silvas, MD;  Location: [facility] ENDOSCOPY;  Service: Endoscopy;  Laterality: N/A;  . ESOPHAGOGASTRODUODENOSCOPY (EGD) WITH PROPOFOL N/A 08/08/2018   Procedure: ESOPHAGOGASTRODUODENOSCOPY (EGD) WITH PROPOFOL;  Surgeon: Manya Silvas, MD;  Location: [facility] ENDOSCOPY;  Service: Endoscopy;  Laterality: N/A;  . ESOPHAGOGASTRODUODENOSCOPY (EGD) WITH PROPOFOL N/A 05/21/2020   Procedure: ESOPHAGOGASTRODUODENOSCOPY (EGD) WITH PROPOFOL;  Surgeon: [name] Manifold, MD;  Location: [facility] ENDOSCOPY;  Service: Endoscopy;  Laterality: N/A;  . GIVENS CAPSULE STUDY N/A 12/05/2017   Procedure: GIVENS CAPSULE STUDY;  Surgeon: Manya Silvas, MD;  Location: [facility] ENDOSCOPY;  Service: Endoscopy;  Laterality: N/A;  . TONSILLECTOMY    . TONSILLECTOMY AND ADENOIDECTOMY    . ULNAR NERVE TRANSPOSITION    . UVULOPALATOPHARYNGOPLASTY      Social History  reports that he has quit smoking. His smoking use included cigars and cigarettes. He quit after 20.00 years of use. He has never used smokeless tobacco. He reports that he does not drink alcohol and does not use drugs.  No Known Allergies  Family History  Problem Relation Age of Onset  . Diabetes Other   . Transient ischemic attack Father   . CAD Father      Prior to Admission medications   Medication Sig Start Date End  Date Taking? Authorizing Provider  atorvastatin (LIPITOR) 20 MG tablet Take 20 mg by mouth daily. 03/23/20   [provider]  ferrous sulfate 325 (65 FE) MG tablet Take 1 tablet (325 mg total) by mouth daily. 05/04/20   Nicole Kindred A, DO  hydrALAZINE (APRESOLINE) 50 MG tablet Take 1 tablet (50 mg total) by mouth 2 (two) times daily. 05/03/20    Ezekiel Slocumb, DO  Ipratropium-Albuterol (COMBIVENT RESPIMAT) 20-100 MCG/ACT AERS respimat Inhale 1 puff into the lungs every 6 (six) hours. 02/12/20   Loletha Grayer, MD  lactulose (CHRONULAC) 10 GM/15ML solution Take 90 mLs (60 g total) by mouth daily. Patient taking differently: Take 45 g by mouth 2 (two) times daily.  05/04/20   Ezekiel Slocumb, DO  LORazepam (ATIVAN) 0.5 MG tablet Take 1 tablet (0.5 mg total) by mouth 3 (three) times daily as needed for Anxiety    [provider]  metolazone (ZAROXOLYN) 5 MG tablet Take 5 mg by mouth daily. Taking every 3 days 05/13/20   [provider]  NOVOLOG MIX 70/30 FLEXPEN (70-30) 100 UNIT/ML FlexPen 80-84 Units as directed. Inject 84 Units Subcutaneous every morning at breakfast and 80 units at dinner 03/17/20   [provider]  pantoprazole (PROTONIX) 40 MG tablet Take 1 tablet (40 mg total) by mouth 2 (two) times daily. 03/06/19 06/01/20  Dustin Flock, MD  potassium chloride (KLOR-CON) 10 MEQ tablet Take 10 mEq by mouth daily. 04/21/20   [provider]  rifaximin (XIFAXAN) 550 MG TABS tablet Take 1 tablet (550 mg total) by mouth 2 (two) times daily. 02/12/20   Loletha Grayer, MD  sodium chloride (OCEAN) 0.65 % SOLN nasal spray Place 1 spray into both nostrils as needed for congestion (nose irritation). 02/12/20   Loletha Grayer, MD  tamsulosin (FLOMAX) 0.4 MG CAPS capsule Take 1 capsule (0.4 mg total) by mouth daily after supper. 05/03/20   Ezekiel Slocumb, DO  torsemide (DEMADEX) 100 MG tablet Take 1 tablet (100 mg total) by mouth daily. 05/04/20   Ezekiel Slocumb, DO    Physical Exam: Vitals:   06/07/20 0015 06/07/20 0030 06/07/20 0045 06/07/20 0104  BP: (!) 168/58 (!) 171/47 131/83 (!) 166/91  Pulse: 65 82 69 68  Resp: 19 (!) 21 19 (!) 21  Temp:      TempSrc:      SpO2: 98% 98% 98% 98%  Weight:      Height:        Constitutional: NAD, calm, comfortable Vitals:   06/07/20 0015 06/07/20 0030  06/07/20 0045 06/07/20 0104  BP: (!) 168/58 (!) 171/47 131/83 (!) 166/91  Pulse: 65 82 69 68  Resp: 19 (!) 21 19 (!) 21  Temp:      TempSrc:      SpO2: 98% 98% 98% 98%  Weight:      Height:       General: WDWN, Alert.  Oriented to person and place but not time. Eyes: EOMI, PERRL, lids and conjunctivae normal.  Sclera nonicteric.  No nystagmus HENT:  [place]/AT, external ears normal.  Nares patent without epistasis.  Mucous membranes are moist. Posterior pharynx clear of any exudate or lesions. Neck: Soft, normal range of motion, supple, no masses, obese.  Trachea midline Respiratory: Equal breath sounds but diminished.  Mild diffuse rales.  No wheezing, no crackles. Normal respiratory effort. No accessory muscle use.  Cardiovascular: Irregularly irregular rhythm with normal rate. 2/6 systolic murmur.no rubs / gallops.  Mild edema of  legs. 1+ pedal pulses. Abdomen: Soft, no tenderness, nondistended, morbidly obese.  No rebound or guarding.  No masses palpated. Bowel sounds normoactive GU:  Scrotal swelling. Condom catheter in place.  Musculoskeletal: FROM. no clubbing / cyanosis. No joint deformity upper and lower extremities. no contractures. Normal muscle tone.  Skin: Warm, dry, intact no rashes, lesions, ulcers. No induration Neurologic: CN 2-12 grossly intact.  Normal speech.  All 4 extremities spontaneously.  Withdraws from painful stimuli.  Sensation intact, patella DTR +1 bilaterally. Strength 4/5 in all extremities.       Labs on Admission: I have personally reviewed following labs and imaging studies  CBC: Recent Labs  Lab 06/02/20 1309 06/06/20 2154  WBC 4.8 5.2  NEUTROABS 3.9 4.4  HGB 7.6* 7.4*  HCT 22.3* 22.5*  MCV 92.5 95.3  PLT 79* 80*    Basic Metabolic Panel: Recent Labs  Lab 06/06/20 2154  NA 140  K 2.3*  CL 96*  CO2 28  GLUCOSE 163*  BUN 101*  CREATININE 2.89*  CALCIUM 8.7*  MG 2.1    GFR: Estimated Creatinine Clearance: 32.4 mL/min (A) (by C-G  formula based on SCr of 2.89 mg/dL (H)).  Liver Function Tests: Recent Labs  Lab 06/06/20 2154  AST 46*  ALT 31  ALKPHOS 103  BILITOT 1.0  PROT 6.6  ALBUMIN 2.8*    Urine analysis:    Component Value Date/Time   COLORURINE STRAW (A) 06/06/2020 2235   APPEARANCEUR CLEAR (A) 06/06/2020 2235   APPEARANCEUR Clear 09/10/2014 0456   LABSPEC 1.006 06/06/2020 2235   LABSPEC 1.016 09/10/2014 0456   PHURINE 6.0 06/06/2020 [place] 06/06/2020 2235   GLUCOSEU Negative 09/10/2014 0456   HGBUR NEGATIVE 06/06/2020 2235   BILIRUBINUR NEGATIVE 06/06/2020 2235   BILIRUBINUR Negative 09/10/2014 0456   [place] 06/06/2020 2235   PROTEINUR 30 (A) 06/06/2020 2235   NITRITE NEGATIVE 06/06/2020 2235   LEUKOCYTESUR NEGATIVE 06/06/2020 2235   LEUKOCYTESUR Negative 09/10/2014 0456    Radiological Exams on Admission: CT Abdomen Pelvis Wo Contrast  Result Date: 06/07/2020 CLINICAL DATA:  Abdominal pain EXAM: CT ABDOMEN AND PELVIS WITHOUT CONTRAST TECHNIQUE: Multidetector CT imaging of the abdomen and pelvis was performed following the standard protocol without IV contrast. COMPARISON:  04/23/2020 FINDINGS: Lower chest: Coronary artery and valvular calcifications. Low-density blood pool compatible with anemia. No acute abnormality. Hepatobiliary: Changes of cirrhosis. Scattered calcifications in the spleen. Small layering gallstones within the gallbladder, unchanged. Pancreas: No focal abnormality or ductal dilatation. Spleen: Splenomegaly, stable. Adrenals/Urinary Tract: No adrenal abnormality. No focal renal abnormality. No stones or hydronephrosis. Urinary bladder is unremarkable. Stomach/Bowel: Stomach, large and small bowel grossly unremarkable. Vascular/Lymphatic: Aortic atherosclerosis. No evidence of aneurysm or adenopathy. 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 Reproductive: Prostate calcifications. Other: Small amount of  perihepatic, perisplenic and pelvic ascites. Moderate to large sized right inguinal hernia containing ascites. Musculoskeletal: No acute bony abnormality. IMPRESSION: Changes of cirrhosis.  Associated splenomegaly and ascites. Coronary artery disease, aortic atherosclerosis. Cholelithiasis. Moderate to large-sized right inguinal hernia containing ascites. Electronically Signed   By: Rolm Baptise M.D.   On: 06/07/2020 00:08   CT Head Wo Contrast  Result Date: 06/06/2020 CLINICAL DATA:  Mental status changes EXAM: CT HEAD WITHOUT CONTRAST TECHNIQUE: Contiguous axial images were obtained from the base of the skull through the vertex without intravenous contrast. COMPARISON:  01/28/2020 FINDINGS: Brain: No acute intracranial abnormality. Specifically, no hemorrhage, hydrocephalus, mass lesion, acute infarction, or  significant intracranial injury. Vascular: No hyperdense vessel or unexpected calcification. Skull: No acute calvarial abnormality. Sinuses/Orbits: Visualized paranasal sinuses and mastoids clear. Orbital soft tissues unremarkable. Other: None IMPRESSION: Normal study. Electronically Signed   By: Rolm Baptise M.D.   On: 06/06/2020 22:33   DG Chest Portable 1 View  Result Date: 06/06/2020 CLINICAL DATA:  Altered mental status EXAM: PORTABLE CHEST 1 VIEW COMPARISON:  04/30/2020 FINDINGS: Heart is borderline in size. Interstitial prominence throughout the lungs. No effusions or acute bony abnormality. IMPRESSION: Diffuse interstitial prominence throughout the lungs could reflect interstitial edema or atypical infection. Electronically Signed   By: Rolm Baptise M.D.   On: 06/06/2020 22:32    EKG: Independently reviewed.  EKG is reviewed and shows atrial fibrillation with no acute ST elevation or depression.  QTc is prolonged at 503  Assessment/Plan Principal Problem:   Hepatic encephalopathy [facility]) Mr. Ewart with hepatic encephalopathy with ammonia level greater than 100.  Patient was given lactulose  in the emergency room.  Patient be continued on his home dose of Xifaxan. Continue lactulose.  Monitor ammonia level.  Active Problems:   Cirrhosis with ascites Chronic.    Hypokalemia Patient with low potassium level 2.3.  Patient had magnesium repleted and has been started on potassium supplementation. Recheck electrolytes and renal function in morning    Type II diabetes mellitus with renal manifestations ([place]) monitor blood sugars. Pt had HgbA1c a few weeks ago that was under 6 so will not repeat at this time. Sliding scale insulin provide as needed for glycemic control. Will hold night insulin with hypoglycemia until have better handle on blood sugar levels and fluctuation.     CKD (chronic kidney disease) stage 4, GFR 15-29 ml/min (HCC) Stable chronic kidney disease stage IV Monitor electrolytes and renal function    Paroxysmal atrial fibrillation [facility]) Patient with chronic paroxysmal atrial fibrillation.     Prolonged QT interval Avoid medications that will further prolong QTc.      Hypoglycemia Patient with hypoglycemia with blood sugar 30 when he arrived.  Blood sugar is now improved.  It is unclear how much insulin patient was taking at home We will monitor blood sugars before every meal and nightly.  Diabetes education provided before discharge.    Anemia of chronic disease Stable hemoglobin hematocrit.  Recheck CBC in morning    Obesity, Class III, BMI 40-49.9 (morbid obesity) ([place])  patient to follow-up with PCP for dietary last interventions for weight loss     DVT prophylaxis: [place] score elevated.  Heparin for DVT prophylaxis Code Status:   Full code Family Communication:  No family is at bedside. Disposition Plan:   Patient is from:  Home  Anticipated DC to:  Home versus SNF  Anticipated DC date:  Anticipate greater than 2 midnight stay in the hospital to treat acute medical condition  Anticipated DC barriers: Mr. Gumina will need discharge planning  evaluation and physical therapy evaluation to see if there is need for SNF                                                                placement   Admission status:  Inpatient  Severity of Illness: The appropriate patient status for this patient is INPATIENT. Inpatient status is judged to  be reasonable and necessary in order to provide the required intensity of service to ensure the patient's safety. The patient's presenting symptoms, physical exam findings, and initial radiographic and laboratory data in the context of their chronic comorbidities is felt to place them at high risk for further clinical deterioration. Furthermore, it is not anticipated that the patient will be medically stable for discharge from the hospital within 2 midnights of admission. The following factors support the patient status of inpatient.   * I certify that at the point of admission it is my clinical judgment that the patient will require inpatient hospital care spanning beyond 2 midnights from the point of admission due to high intensity of service, high risk for further deterioration and high frequency of surveillance required.Yevonne Aline Shed Nixon MD [facility]  How to contact the [facility] Attending or Consulting provider [place] or covering provider during after hours [place], for this patient?   1. Check the care team in [facility] and look for a) attending/consulting [facility] provider listed and b) the [facility] team listed 2. Log into [email or web address] and use [place] universal password to access. If you do not have the password, please contact the hospital operator. 3. Locate the [facility] provider you are looking for under [facility] and page to a number that you can be directly reached. 4. If you still have difficulty reaching the provider, please page the [facility] (Director on Call) for the Hospitalists listed on amion for assistance.  06/07/2020, 2:38 AM

## 2020-06-07 NOTE — ED Notes (Signed)
Pt had urinated in bed and experienced large BM. This nurse and Vicente Males, RN cleaned pt at this time, pt received full bed change, clean brief and chuck. Male purewick applied in attempt to assist with pt to remain dry. Pt is very swollen around the pubic region as reported by prior nurse. Pt returned to position of comfort. Pt remains altered and yellling into the hall repeatedly, despite being continuously redirected with no success.

## 2020-06-07 NOTE — ED Notes (Addendum)
Patient transported to Room 215

## 2020-06-07 NOTE — ED Provider Notes (Signed)
Accepted care of this patient from Dr. Charna Archer at midnight.  Pending CT for scrotal swelling.  CT showing large size right inguinal hernia containing ascites.  Patient to be admitted for hepatic encephalopathy.  Lactulose given by Dr. Charna Archer.  Discussed with the hospitalist for admission.   I have personally reviewed the images performed during this visit and I agree with the Radiologist's read.   Interpretation by Radiologist:  CT Abdomen Pelvis Wo Contrast  Result Date: 06/07/2020 CLINICAL DATA:  Abdominal pain EXAM: CT ABDOMEN AND PELVIS WITHOUT CONTRAST TECHNIQUE: Multidetector CT imaging of the abdomen and pelvis was performed following the standard protocol without IV contrast. COMPARISON:  04/23/2020 FINDINGS: Lower chest: Coronary artery and valvular calcifications. Low-density blood pool compatible with anemia. No acute abnormality. Hepatobiliary: Changes of cirrhosis. Scattered calcifications in the spleen. Small layering gallstones within the gallbladder, unchanged. Pancreas: No focal abnormality or ductal dilatation. Spleen: Splenomegaly, stable. Adrenals/Urinary Tract: No adrenal abnormality. No focal renal abnormality. No stones or hydronephrosis. Urinary bladder is unremarkable. Stomach/Bowel: Stomach, large and small bowel grossly unremarkable. Vascular/Lymphatic: Aortic atherosclerosis. No evidence of aneurysm or adenopathy. 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 Reproductive: Prostate calcifications. Other: Small amount of perihepatic, perisplenic and pelvic ascites. Moderate to large sized right inguinal hernia containing ascites. Musculoskeletal: No acute bony abnormality. IMPRESSION: Changes of cirrhosis.  Associated splenomegaly and ascites. Coronary artery disease, aortic atherosclerosis. Cholelithiasis. Moderate to large-sized right inguinal hernia containing ascites. Electronically Signed   By: Rolm Baptise M.D.   On: 06/07/2020 00:08    CT Head Wo Contrast  Result Date: 06/06/2020 CLINICAL DATA:  Mental status changes EXAM: CT HEAD WITHOUT CONTRAST TECHNIQUE: Contiguous axial images were obtained from the base of the skull through the vertex without intravenous contrast. COMPARISON:  01/28/2020 FINDINGS: Brain: No acute intracranial abnormality. Specifically, no hemorrhage, hydrocephalus, mass lesion, acute infarction, or significant intracranial injury. Vascular: No hyperdense vessel or unexpected calcification. Skull: No acute calvarial abnormality. Sinuses/Orbits: Visualized paranasal sinuses and mastoids clear. Orbital soft tissues unremarkable. Other: None IMPRESSION: Normal study. Electronically Signed   By: Rolm Baptise M.D.   On: 06/06/2020 22:33   DG Chest Portable 1 View  Result Date: 06/06/2020 CLINICAL DATA:  Altered mental status EXAM: PORTABLE CHEST 1 VIEW COMPARISON:  04/30/2020 FINDINGS: Heart is borderline in size. Interstitial prominence throughout the lungs. No effusions or acute bony abnormality. IMPRESSION: Diffuse interstitial prominence throughout the lungs could reflect interstitial edema or atypical infection. Electronically Signed   By: Rolm Baptise M.D.   On: 06/06/2020 22:32      Rudene Re, MD 06/07/20 [phone]

## 2020-06-07 NOTE — ED Notes (Signed)
Pt again yelling into hall "help me" and when this nurse checks on pt he is repeatedly stating help me get out of bed. Pt again informed that he has to stay in bed due to safety but pt is not understanding

## 2020-06-07 NOTE — ED Notes (Signed)
This RN and Caitlyn tech attempted to in&out patient at this time. Due to swelling around patient's testicles and groin, attempt unsuccessful. Patient placed on urinary external catheter bag at this time. Dr. Charna Archer notified.

## 2020-06-07 NOTE — Progress Notes (Signed)
CH received call from [facility] registration requesting help for niece attempting to set up MPOA for pt.  CH called Niece [name] and learned that pt. was admitted approx. 34mo ago and had completed an AD w/CH Mariea Clonts but seems to have been discharged before AD could be notarized.  [place] checked on pt. currently admitted in ED to see if completing new copy of AD is possibility --> pt. asleep.  [place] will make pastoral care team aware of need for AD completion for follow-up later this week.

## 2020-06-07 NOTE — ED Notes (Addendum)
Pt removed IV from Left hand at this time, Pt arm, chest, and face covered in blood and cleaned by this nurse and Vicente Males, RN at this time. Other IV is wrapped and secured with coban at this time. Benjamine Mola, NP contacted to report critial K, states to draw repeat after completion of replacement K currently infusing

## 2020-06-07 NOTE — ED Notes (Signed)
Pt moved from ED stretcher to Hospital bed in attempt to help with comfort and relax patient.

## 2020-06-07 NOTE — ED Notes (Addendum)
Spoke with patients niece [name] updated her on patients status

## 2020-06-08 DIAGNOSIS — R188 Other ascites: Secondary | ICD-10-CM

## 2020-06-08 DIAGNOSIS — K7460 Unspecified cirrhosis of liver: Secondary | ICD-10-CM

## 2020-06-08 LAB — AMMONIA: Ammonia: 74 umol/L — ABNORMAL HIGH (ref 9–35)

## 2020-06-08 LAB — CBC
HCT: 23.2 % — ABNORMAL LOW (ref 39.0–52.0)
Hemoglobin: 7.4 g/dL — ABNORMAL LOW (ref 13.0–17.0)
MCH: 31.6 pg (ref 26.0–34.0)
MCHC: 31.9 g/dL (ref 30.0–36.0)
MCV: 99.1 fL (ref 80.0–100.0)
Platelets: 71 10*3/uL — ABNORMAL LOW (ref 150–400)
RBC: 2.34 MIL/uL — ABNORMAL LOW (ref 4.22–5.81)
RDW: 16.9 % — ABNORMAL HIGH (ref 11.5–15.5)
WBC: 4.1 10*3/uL (ref 4.0–10.5)
nRBC: 0 % (ref 0.0–0.2)

## 2020-06-08 LAB — COMPREHENSIVE METABOLIC PANEL
ALT: 25 U/L (ref 0–44)
AST: 40 U/L (ref 15–41)
Albumin: 2.9 g/dL — ABNORMAL LOW (ref 3.5–5.0)
Alkaline Phosphatase: 99 U/L (ref 38–126)
Anion gap: 14 (ref 5–15)
BUN: 95 mg/dL — ABNORMAL HIGH (ref 8–23)
CO2: 29 mmol/L (ref 22–32)
Calcium: 8.9 mg/dL (ref 8.9–10.3)
Chloride: 102 mmol/L (ref 98–111)
Creatinine, Ser: 2.73 mg/dL — ABNORMAL HIGH (ref 0.61–1.24)
GFR calc Af Amer: 26 mL/min — ABNORMAL LOW (ref >60)
GFR calc non Af Amer: 23 mL/min — ABNORMAL LOW (ref >60)
Glucose, Bld: 148 mg/dL — ABNORMAL HIGH (ref 70–99)
Potassium: 2.6 mmol/L — CL (ref 3.5–5.1)
Sodium: 145 mmol/L (ref 135–145)
Total Bilirubin: 1.3 mg/dL — ABNORMAL HIGH (ref 0.3–1.2)
Total Protein: 6.5 g/dL (ref 6.5–8.1)

## 2020-06-08 LAB — GLUCOSE, CAPILLARY
Glucose-Capillary: 127 mg/dL — ABNORMAL HIGH (ref 70–99)
Glucose-Capillary: 155 mg/dL — ABNORMAL HIGH (ref 70–99)
Glucose-Capillary: 245 mg/dL — ABNORMAL HIGH (ref 70–99)
Glucose-Capillary: 226 mg/dL — ABNORMAL HIGH (ref 70–99)

## 2020-06-08 LAB — BRAIN NATRIURETIC PEPTIDE: B Natriuretic Peptide: 343.3 pg/mL — ABNORMAL HIGH (ref 0.0–100.0)

## 2020-06-08 MED ORDER — LACTULOSE 10 GM/15ML PO SOLN
45.0000 g | Freq: Two times a day (BID) | ORAL | Status: DC
  Administered 2020-06-08 – 2020-06-11 (×8): 45 g via ORAL
  Filled 2020-06-08 (×8): qty 90

## 2020-06-08 MED ORDER — HYDROCERIN EX CREA
1.0000 "application " | TOPICAL_CREAM | Freq: Every day | CUTANEOUS | Status: DC
  Administered 2020-06-08 – 2020-06-09 (×2): 1 via TOPICAL
  Filled 2020-06-08: qty 113

## 2020-06-08 MED ORDER — POTASSIUM CHLORIDE CRYS ER 20 MEQ PO TBCR
40.0000 meq | EXTENDED_RELEASE_TABLET | Freq: Two times a day (BID) | ORAL | Status: AC
  Administered 2020-06-08 (×2): 40 meq via ORAL
  Filled 2020-06-08 (×2): qty 2

## 2020-06-08 MED ORDER — LORAZEPAM 0.5 MG PO TABS
0.5000 mg | ORAL_TABLET | Freq: Two times a day (BID) | ORAL | Status: DC | PRN
  Administered 2020-06-08 – 2020-06-10 (×4): 0.5 mg via ORAL
  Filled 2020-06-08 (×5): qty 1

## 2020-06-08 MED ORDER — IPRATROPIUM-ALBUTEROL 0.5-2.5 (3) MG/3ML IN SOLN
3.0000 mL | Freq: Three times a day (TID) | RESPIRATORY_TRACT | Status: DC
  Administered 2020-06-08 – 2020-06-09 (×4): 3 mL via RESPIRATORY_TRACT
  Filled 2020-06-08 (×4): qty 3

## 2020-06-08 NOTE — Progress Notes (Signed)
PT Cancellation Note  Patient Details Name: Corey West MRN: [id number] DOB: [birth year]   Cancelled Treatment:    Reason Eval/Treat Not Completed: Other (comment) PT orders received and chart reviewed. Pt noted to have K+ critically low at 2.6; contraindicated at this time as it is outside of therapeutic window for exertional activity. Will continue to follow and initiate services when pt medically appropriate to participate in therapy.   Vale Haven 06/08/2020, 1:33 PM

## 2020-06-08 NOTE — Progress Notes (Signed)
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 Continue on lactulose, xifaxan   Cirrhosis: with ascites. Continue on lactulose, xifaxan   Thrombocytopenia: likely secondary to cirrhosis. Will continue to mopnitor   Hypokalemia: KCl repleted. Will continue to monitor   DM2: well controlled. W/ episodes of hypoglycemia which is now resolved. Continue on SSI w/ accuchecks   CKDIV: Cr is trending down from day prior. Will continue to monitor. Avoid nephrotoxic meds   PAF: not on any rate controlling meds. No anticoagulation possible secondary to increased bleeding risk as pt has cirrhosis   Prolonged QT interval: continue on tele. Avoid medications that will prolong QTc.   ACD: will transfuse if Hb <7. Will continue to monitor   Morbid Obesity: BMI 41.7. Would benefit from significant weight loss    DVT prophylaxis: heparin  Code Status: full  Family Communication: Disposition Plan: depends on PT/OT recs   Status is: Inpatient  Remains inpatient appropriate because:Altered mental status   Dispo: The patient is from: Home              Anticipated d/c is to: Home vs SNF               Anticipated d/c date is: 3 days              Patient currently is not medically stable to d/c.        Consultants:      Procedures:    Antimicrobials:    Subjective: Pt c/o fatigue  Objective: Vitals:   06/08/20 0732 06/08/20 0750 06/08/20 1040  06/08/20 1132  BP:  (!) 152/59 (!) 156/50 (!) 146/67  Pulse:  (!) 45  64  Resp:  16  16  Temp:  97.7 F (36.5 C)  97.7 F (36.5 C)  TempSrc:  Oral  Oral  SpO2: 97% 100%  100%  Weight:      Height:        Intake/Output Summary (Last 24 hours) at 06/08/2020 1345 Last data filed at 06/08/2020 4332 Gross per 24 hour  Intake 1099 ml  Output 200 ml  Net 899 ml   Filed Weights   06/06/20 2203  Weight: 128.3 kg    Examination:  General exam: Appears calm and comfortable  Respiratory system: diminished breath sounds b/l.  Cardiovascular system: irregularly irregular. No rubs, gallops or clicks.  Gastrointestinal system: Abdomen is obese, soft and nontender. Hypoactive bowel sounds heard. Central nervous system: Alert and awake. Moves all 4 extremities  Psychiatry: Judgement and insight appear abnormal. Mood & affect appropriate.     Data Reviewed: I have personally reviewed following labs and imaging studies  CBC: Recent Labs  Lab 06/02/20 1309 06/06/20 2154 06/07/20 0506 06/08/20 0915  WBC 4.8 5.2 5.3 4.1  NEUTROABS 3.9 4.4  --   --   HGB 7.6* 7.4* 7.6* 7.4*  HCT 22.3* 22.5* 22.0* 23.2*  MCV 92.5 95.3 93.2 99.1  PLT 79*  80* 80* 71*   Basic Metabolic Panel: Recent Labs  Lab 06/06/20 2154 06/07/20 0506 06/07/20 0655 06/08/20 0915  NA 140 141  --  145  K 2.3* 2.6* 2.5* 2.6*  CL 96* 97*  --  102  CO2 28 29  --  29  GLUCOSE 163* 156*  --  148*  BUN 101* 100*  --  95*  CREATININE 2.89* 2.87*  2.95*  --  2.73*  CALCIUM 8.7* 8.9  --  8.9  MG 2.1  --   --   --    GFR: Estimated Creatinine Clearance: 34.3 mL/min (A) (by C-G formula based on SCr of 2.73 mg/dL (H)). Liver Function Tests: Recent Labs  Lab 06/06/20 2154 06/08/20 0915  AST 46* 40  ALT 31 25  ALKPHOS 103 99  BILITOT 1.0 1.3*  PROT 6.6 6.5  ALBUMIN 2.8* 2.9*   No results for input(s): LIPASE, AMYLASE in the last 168 hours. Recent Labs  Lab 06/06/20 2154 06/07/20 0506 06/08/20 0915  AMMONIA  103* 140* 74*   Coagulation Profile: Recent Labs  Lab 06/06/20 2154  INR 1.4*   Cardiac Enzymes: No results for input(s): CKTOTAL, CKMB, CKMBINDEX, TROPONINI in the last 168 hours. BNP (last 3 results) No results for input(s): PROBNP in the last 8760 hours. HbA1C: No results for input(s): HGBA1C in the last 72 hours. CBG: Recent Labs  Lab 06/07/20 1427 06/07/20 1713 06/07/20 2125 06/08/20 0747 06/08/20 1130  GLUCAP 137* 130* 125* 127* 155*   Lipid Profile: No results for input(s): CHOL, HDL, LDLCALC, TRIG, CHOLHDL, LDLDIRECT in the last 72 hours. Thyroid Function Tests: No results for input(s): TSH, T4TOTAL, FREET4, T3FREE, THYROIDAB in the last 72 hours. Anemia Panel: No results for input(s): VITAMINB12, FOLATE, FERRITIN, TIBC, IRON, RETICCTPCT in the last 72 hours. Sepsis Labs: No results for input(s): PROCALCITON, LATICACIDVEN in the last 168 hours.  Recent Results (from the past 240 hour(s))  SARS Coronavirus 2 by RT PCR (hospital order, performed in [facility] hospital lab) Nasopharyngeal Nasopharyngeal Swab     Status: None   Collection Time: 06/06/20 10:10 PM   Specimen: Nasopharyngeal Swab  Result Value Ref Range Status   SARS Coronavirus 2 NEGATIVE NEGATIVE Final    Comment: (NOTE) SARS-CoV-2 target nucleic acids are NOT DETECTED.  The SARS-CoV-2 RNA is generally detectable in upper and lower respiratory specimens during the acute phase of infection. The lowest concentration of SARS-CoV-2 viral copies this assay can detect is 250 copies / mL. A negative result does not preclude SARS-CoV-2 infection and should not be used as the sole basis for treatment or other patient management decisions.  A negative result may occur with improper specimen collection / handling, submission of specimen other than nasopharyngeal swab, presence of viral mutation(s) within the areas targeted by this assay, and inadequate number of viral copies (<250 copies / mL). A negative  result must be combined with clinical observations, patient history, and epidemiological information.  Fact Sheet for Patients:   StrictlyIdeas.no  Fact Sheet for Healthcare Providers: BankingDealers.co.za  This test is not yet approved or  cleared by the [place] FDA and has been authorized for detection and/or diagnosis of SARS-CoV-2 by FDA under an Emergency Use Authorization (EUA).  This EUA will remain in effect (meaning this test can be used) for the duration of the COVID-19 declaration under Section 564(b)(1) of the Act, 21 U.S.C. section 360bbb-3(b)(1), unless the authorization is terminated or revoked sooner.  Performed at [facility], [place]  [address] [place], [place] [address]          Radiology Studies: CT Abdomen Pelvis Wo Contrast  Result Date: 06/07/2020 CLINICAL DATA:  Abdominal pain EXAM: CT ABDOMEN AND PELVIS WITHOUT CONTRAST TECHNIQUE: Multidetector CT imaging of the abdomen and pelvis was performed following the standard protocol without IV contrast. COMPARISON:  04/23/2020 FINDINGS: Lower chest: Coronary artery and valvular calcifications. Low-density blood pool compatible with anemia. No acute abnormality. Hepatobiliary: Changes of cirrhosis. Scattered calcifications in the spleen. Small layering gallstones within the gallbladder, unchanged. Pancreas: No focal abnormality or ductal dilatation. Spleen: Splenomegaly, stable. Adrenals/Urinary Tract: No adrenal abnormality. No focal renal abnormality. No stones or hydronephrosis. Urinary bladder is unremarkable. Stomach/Bowel: Stomach, large and small bowel grossly unremarkable. Vascular/Lymphatic: Aortic atherosclerosis. No evidence of aneurysm or adenopathy. 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 Reproductive: Prostate calcifications. Other: Small amount of perihepatic, perisplenic and pelvic ascites. Moderate  to large sized right inguinal hernia containing ascites. Musculoskeletal: No acute bony abnormality. IMPRESSION: Changes of cirrhosis.  Associated splenomegaly and ascites. Coronary artery disease, aortic atherosclerosis. Cholelithiasis. Moderate to large-sized right inguinal hernia containing ascites. Electronically Signed   By: Rolm Baptise M.D.   On: 06/07/2020 00:08   CT Head Wo Contrast  Result Date: 06/06/2020 CLINICAL DATA:  Mental status changes EXAM: CT HEAD WITHOUT CONTRAST TECHNIQUE: Contiguous axial images were obtained from the base of the skull through the vertex without intravenous contrast. COMPARISON:  01/28/2020 FINDINGS: Brain: No acute intracranial abnormality. Specifically, no hemorrhage, hydrocephalus, mass lesion, acute infarction, or significant intracranial injury. Vascular: No hyperdense vessel or unexpected calcification. Skull: No acute calvarial abnormality. Sinuses/Orbits: Visualized paranasal sinuses and mastoids clear. Orbital soft tissues unremarkable. Other: None IMPRESSION: Normal study. Electronically Signed   By: Rolm Baptise M.D.   On: 06/06/2020 22:33   DG Chest Portable 1 View  Result Date: 06/06/2020 CLINICAL DATA:  Altered mental status EXAM: PORTABLE CHEST 1 VIEW COMPARISON:  04/30/2020 FINDINGS: Heart is borderline in size. Interstitial prominence throughout the lungs. No effusions or acute bony abnormality. IMPRESSION: Diffuse interstitial prominence throughout the lungs could reflect interstitial edema or atypical infection. Electronically Signed   By: Rolm Baptise M.D.   On: 06/06/2020 22:32        Scheduled Meds: . atorvastatin  20 mg Oral Daily  . ferrous sulfate  325 mg Oral Daily  . heparin  5,000 Units Subcutaneous Q8H  . hydrALAZINE  50 mg Oral BID  . hydrocerin  1 application Topical Daily  . insulin aspart  0-9 Units Subcutaneous TID WC  . ipratropium-albuterol  3 mL Nebulization TID  . lactulose  45 g Oral BID  . metolazone  5 mg Oral  Daily  . pantoprazole  40 mg Oral BID  . potassium chloride  40 mEq Oral BID  . rifaximin  550 mg Oral BID  . tamsulosin  0.4 mg Oral QPC supper  . torsemide  100 mg Oral Daily   Continuous Infusions:   LOS: 1 day    Time spent: 30 mins     Wyvonnia Dusky, MD [facility] Pager [phone]  If 7PM-7AM, please contact night-coverage [email or web address] 06/08/2020, 1:45 PM

## 2020-06-08 NOTE — Consult Note (Signed)
WOC Nurse Consult Note: Reason for Consult:chronic skin changes to bilateral lower legs.  Wears compression and has full thickness tissue loss to left posterior lower leg just below knee.   Wound type:trauma from compression and chronic dry skin changes.  Pressure Injury POA: NA Measurement: left posterior lower leg:  1 cm x 4 cm x 0.2 cm  Wound NID:POEU and moist Drainage (amount, consistency, odor) minimal serosanguinous no odor.  Periwound:chronic skin changes to lower legs.  Dressing procedure/placement/frequency:cleanse lower leg with soap and water and pat dry.  Apply eucerin cream to lower legs daily.  Apply xeroform gauze to open wound. Cover with foam dressing.  Change Mon/Wed/Fri.  Will not follow at this time.  Please re-consult if needed.  Domenic Moras MSN, RN, FNP-BC CWON Wound, Ostomy, Continence Nurse Pager [phone]

## 2020-06-08 NOTE — Progress Notes (Signed)
OT Cancellation Note  Patient Details Name: Corey West MRN: [id number] DOB: 1951/01/20   Cancelled Treatment:    Reason Eval/Treat Not Completed: Patient not medically ready. Chart reviewed - pt noted to have K+ critically low at 2.6; contraindicated for exertional activity at this time. Will continue to follow and initiate services as pt medically appropriate to participate in therapy.   Dessie Coma, M.S. OTR/L  06/08/20, 11:57 AM  ascom [phone]
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
## 2020-06-08 NOTE — Progress Notes (Signed)
Attempted to do lactulose enema but only got 200 mL in rectum but most of the contents came out into the rectal tube. Contacted [facility] to see if patient can take po lactulose because he is more alert now and can swallow fluids. Awaiting orders.  Christene Slates

## 2020-06-09 DIAGNOSIS — D638 Anemia in other chronic diseases classified elsewhere: Secondary | ICD-10-CM

## 2020-06-09 LAB — COMPREHENSIVE METABOLIC PANEL
ALT: 22 U/L (ref 0–44)
AST: 32 U/L (ref 15–41)
Albumin: 2.5 g/dL — ABNORMAL LOW (ref 3.5–5.0)
Alkaline Phosphatase: 85 U/L (ref 38–126)
Anion gap: 14 (ref 5–15)
BUN: 91 mg/dL — ABNORMAL HIGH (ref 8–23)
CO2: 29 mmol/L (ref 22–32)
Calcium: 8.7 mg/dL — ABNORMAL LOW (ref 8.9–10.3)
Chloride: 101 mmol/L (ref 98–111)
Creatinine, Ser: 2.65 mg/dL — ABNORMAL HIGH (ref 0.61–1.24)
GFR calc Af Amer: 27 mL/min — ABNORMAL LOW (ref >60)
GFR calc non Af Amer: 24 mL/min — ABNORMAL LOW (ref >60)
Glucose, Bld: 195 mg/dL — ABNORMAL HIGH (ref 70–99)
Potassium: 3.1 mmol/L — ABNORMAL LOW (ref 3.5–5.1)
Sodium: 144 mmol/L (ref 135–145)
Total Bilirubin: 1.2 mg/dL (ref 0.3–1.2)
Total Protein: 5.8 g/dL — ABNORMAL LOW (ref 6.5–8.1)

## 2020-06-09 LAB — CBC
HCT: 21.3 % — ABNORMAL LOW (ref 39.0–52.0)
Hemoglobin: 6.8 g/dL — ABNORMAL LOW (ref 13.0–17.0)
MCH: 31.6 pg (ref 26.0–34.0)
MCHC: 31.9 g/dL (ref 30.0–36.0)
MCV: 99.1 fL (ref 80.0–100.0)
Platelets: 64 10*3/uL — ABNORMAL LOW (ref 150–400)
RBC: 2.15 MIL/uL — ABNORMAL LOW (ref 4.22–5.81)
RDW: 16.8 % — ABNORMAL HIGH (ref 11.5–15.5)
WBC: 4.1 10*3/uL (ref 4.0–10.5)
nRBC: 0 % (ref 0.0–0.2)

## 2020-06-09 LAB — HEMOGLOBIN AND HEMATOCRIT, BLOOD
HCT: 24.6 % — ABNORMAL LOW (ref 39.0–52.0)
Hemoglobin: 7.9 g/dL — ABNORMAL LOW (ref 13.0–17.0)

## 2020-06-09 LAB — GLUCOSE, CAPILLARY
Glucose-Capillary: 175 mg/dL — ABNORMAL HIGH (ref 70–99)
Glucose-Capillary: 219 mg/dL — ABNORMAL HIGH (ref 70–99)
Glucose-Capillary: 214 mg/dL — ABNORMAL HIGH (ref 70–99)
Glucose-Capillary: 204 mg/dL — ABNORMAL HIGH (ref 70–99)

## 2020-06-09 LAB — AMMONIA: Ammonia: 57 umol/L — ABNORMAL HIGH (ref 9–35)

## 2020-06-09 LAB — BRAIN NATRIURETIC PEPTIDE: B Natriuretic Peptide: 397.7 pg/mL — ABNORMAL HIGH (ref 0.0–100.0)

## 2020-06-09 LAB — MAGNESIUM: Magnesium: 2.1 mg/dL (ref 1.7–2.4)

## 2020-06-09 MED ORDER — POTASSIUM CHLORIDE CRYS ER 20 MEQ PO TBCR
40.0000 meq | EXTENDED_RELEASE_TABLET | Freq: Once | ORAL | Status: AC
  Administered 2020-06-09: 40 meq via ORAL
  Filled 2020-06-09: qty 2

## 2020-06-09 MED ORDER — SODIUM CHLORIDE 0.9% IV SOLUTION: Freq: Once | INTRAVENOUS | Status: AC

## 2020-06-09 MED ORDER — IPRATROPIUM-ALBUTEROL 0.5-2.5 (3) MG/3ML IN SOLN: 3.0000 mL | Freq: Four times a day (QID) | RESPIRATORY_TRACT | Status: DC | PRN

## 2020-06-09 NOTE — Evaluation (Signed)
Occupational Therapy Evaluation Patient Details Name: Corey West MRN: [id number] DOB: 12/18/1950 Today's Date: 06/09/2020    History of Present Illness Per MD note: Corey West is a 69 y.o. 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 fib not on anticoagulation, insulin-dependent type 2 diabetes, CKD stage IV, hypertension, hyperlipidemia, chronic venous stasis dermatitis and edema bilateral lower extremities, anxiety, and obesity, OSA noncompliant with CPAP who presents to the ED for evaluation of progressive shortness of breath and lightheadedness with hemoglobin of 6 from doctor's office.  Patient has been having melena stool and was scheduled to follow-up with his GI on 8/19.  He denies abdominal pain, hematemesis.  Denies chest pain   Clinical Impression   Patient presenting with decreased I in self care, balance, functional mobility/transfers, endurance, and safety awareness. Pt very impulsive this session and showing limited awareness of safety.  Patient reports being mod I PTA. Patient currently functioning at min guard - min A overall with RW. Patient will benefit from acute OT to increase overall independence in the areas of ADLs, functional mobility, and safety awareness in order to safely discharge home.   Follow Up Recommendations  Home health OT;Supervision - Intermittent    Equipment Recommendations  None recommended by OT    Recommendations for Other Services Other (comment) (none at this time)     Precautions / Restrictions Precautions Precautions: Fall Restrictions Weight Bearing Restrictions: No      Mobility Bed Mobility Overal bed mobility: Needs Assistance Bed Mobility: Supine to Sit     Supine to sit: Min assist        Transfers Overall transfer level: Needs assistance Equipment used: Rolling walker (2 wheeled) Transfers: Sit  to/from Stand Sit to Stand: Min guard              Balance Overall balance assessment: Needs assistance Sitting-balance support: Feet supported Sitting balance-Leahy Scale: Normal     Standing balance support: During functional activity Standing balance-Leahy Scale: Good Standing balance comment: reliance on RW             ADL either performed or assessed with clinical judgement   ADL Overall ADL's : Needs assistance/impaired     Grooming: Wash/dry hands;Wash/dry face;Oral care;Standing;Min guard   Upper Body Bathing: Sitting;Minimal assistance           Lower Body Dressing: Minimal assistance;Sitting/lateral leans        General ADL Comments: Pt very impulsive with movement and limited safety awareness this session. Very demanding of therapist in regards of what he would and would not participate in.     Vision Baseline Vision/History: Wears glasses Wears Glasses: At all times              Pertinent Vitals/Pain Pain Assessment: No/denies pain     Hand Dominance Right   Extremity/Trunk Assessment Upper Extremity Assessment Upper Extremity Assessment: Generalized weakness   Lower Extremity Assessment Lower Extremity Assessment: Generalized weakness   Cervical / Trunk Assessment Cervical / Trunk Assessment: Kyphotic   Communication Communication Communication: No difficulties   Cognition Arousal/Alertness: Awake/alert Behavior During Therapy: WFL for tasks assessed/performed Overall Cognitive Status: Within Functional Limits for tasks assessed               Exercises Total Joint Exercises Ankle Circles/Pumps: AROM;Strengthening;Both;10 reps;Supine Quad Sets: AROM;Strengthening;Both;10 reps;Supine Heel Slides: AROM;Strengthening;Both;10 reps;Supine Hip ABduction/ADduction: AROM;Strengthening;Both;10 reps;Supine Marching in Standing: AROM;Strengthening;Both;10 reps  Other Exercises Other Exercises: Pt educated on roles of PT and services  offered during hospital stay.        Home Living Family/patient expects to be discharged to:: Private residence Living Arrangements: Alone Available Help at Discharge: Friend(s) Type of Home: House Home Access: Stairs to enter [employer] of Steps: 3 Entrance Stairs-Rails: Right Home Layout: One level     Bathroom Shower/Tub: Tub/shower unit;Other (comment) (pt bathing at sink)   Bathroom Toilet: Handicapped height Bathroom Accessibility: Yes   Home Equipment: Walker - 2 wheels;Cane - single point          Prior Functioning/Environment Level of Independence: Independent with assistive device(s)  Gait / Transfers Assistance Needed: amb with spc or RW ADL's / Homemaking Assistance Needed: mod I per pt report   Comments: Mod independent with HH ambulation with a RW, w/c for MD apts, Ind with ADLs and tasks. Pt. reports taking sinkside baths. Independent with meal preparation, medication management, and driving.        OT Problem List: Decreased strength;Decreased activity tolerance;Decreased safety awareness;Impaired balance (sitting and/or standing)      OT Treatment/Interventions: Self-care/ADL training;Therapeutic exercise;Therapeutic activities;Energy conservation;Patient/family education;Manual therapy;Balance training    OT Goals(Current goals Corey be found in the care plan section) Acute Rehab OT Goals Patient Stated Goal: to go home OT Goal Formulation: With patient Time For Goal Achievement: 06/23/20 Potential to Achieve Goals: Good ADL Goals Pt Will Perform Grooming: with modified independence;standing Pt Will Transfer to Toilet: with modified independence;ambulating Pt Will Perform Toileting - Clothing Manipulation and hygiene: with modified independence;sit to/from stand  OT Frequency: Min 1X/week              AM-PAC OT "6 Clicks" Daily Activity     Outcome Measure Help from another person eating meals?: None Help from another person taking  care of personal grooming?: A Little Help from another person toileting, which includes using toliet, bedpan, or urinal?: A Little Help from another person bathing (including washing, rinsing, drying)?: A Little Help from another person to put on and taking off regular upper body clothing?: A Little Help from another person to put on and taking off regular lower body clothing?: A Little 6 Click Score: 19   End of Session Equipment Utilized During Treatment: Rolling walker Nurse Communication: Mobility status  Activity Tolerance: Patient tolerated treatment well Patient left: in chair;with call bell/phone within reach;with chair alarm set  OT Visit Diagnosis: Muscle weakness (generalized) (M62.81)                Time: 1517-6160 OT Time Calculation (min): 20 min Charges:  OT General Charges $OT Visit: 1 Visit OT Evaluation $OT Eval Low Complexity: 1 Low OT Treatments $Self Care/Home Management : 8-22 mins  Darleen Crocker, MS, OTR/L , CBIS ascom [phone]  06/09/20, 12:59 PM

## 2020-06-09 NOTE — Evaluation (Signed)
Physical Therapy Evaluation Patient Details Name: NICKOLAI RINKS MRN: [id number] DOB: 1951-05-21 Today's Date: 06/09/2020   History of Present Illness  Per MD note: SIRAJ DERMODY is a 69 y.o. 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 fib not on anticoagulation, insulin-dependent type 2 diabetes, CKD stage IV, hypertension, hyperlipidemia, chronic venous stasis dermatitis and edema bilateral lower extremities, anxiety, and obesity, OSA noncompliant with CPAP who presents to the ED for evaluation of progressive shortness of breath and lightheadedness with hemoglobin of 6 from doctor's office.  Patient has been having melena stool and was scheduled to follow-up with his GI on 8/19.  He denies abdominal pain, hematemesis.  Denies chest pain     Clinical Impression  Pt performed well with PT exam.  Pt currently demonstrates enough strength and mobility to safely maneuver and perform transfers and ambulate within house.  Pt does require verbal cuing for hand placement for transfer out of bed, however is able to perform standing transfer without difficulty.  Pt proceeded to ambulate down hallway and to the nursing station without much difficulty, and CGA with use of FWW.  Pt will need [place] services for safe d/c to home.      Follow Up Recommendations Home health PT    Equipment Recommendations  None recommended by PT    Recommendations for Other Services OT consult     Precautions / Restrictions Restrictions Weight Bearing Restrictions: No      Mobility  Bed Mobility Overal bed mobility: Needs Assistance Bed Mobility: Supine to Sit     Supine to sit: Min assist        Transfers Overall transfer level: Modified independent Equipment used: Rolling walker (2 wheeled) Transfers: Sit to/from Stand Sit to Stand: Modified independent (Device/Increase time)             Ambulation/Gait Ambulation/Gait assistance: Modified independent (Device/Increase time);Min guard Gait Distance (Feet): 120 Feet Assistive device: Rolling walker (2 wheeled) Gait Pattern/deviations: Step-through pattern Gait velocity: decreased      Stairs            Wheelchair Mobility    Modified Rankin (Stroke Patients Only)       Balance Overall balance assessment: Modified Independent                                           Pertinent Vitals/Pain Pain Assessment: No/denies pain    Home Living Family/patient expects to be discharged to:: Private residence Living Arrangements: Alone Available Help at Discharge: Friend(s) Type of Home: House Home Access: Stairs to enter Entrance Stairs-Rails: Right Entrance Stairs-Number of Steps: 3 Home Layout: One level Home Equipment: Walker - 2 wheels;Cane - single point      Prior Function Level of Independence: Independent with assistive device(s)   Gait / Transfers Assistance Needed: amb with spc or RW  ADL's / Homemaking Assistance Needed:  (had HHPT and HH nsg)  Comments: Mod independent with HH ambulation with a RW, w/c for MD apts, Ind with ADLs and tasks. Pt. reports taking sinkside baths. Independent with meal preparation, medication management, and driving.     Hand Dominance   Dominant Hand: Right    Extremity/Trunk Assessment   Upper Extremity Assessment Upper Extremity Assessment: Defer to OT evaluation    Lower Extremity Assessment  Lower Extremity Assessment: Generalized weakness       Communication   Communication: No difficulties  Cognition Arousal/Alertness: Awake/alert Behavior During Therapy: WFL for tasks assessed/performed Overall Cognitive Status: Within Functional Limits for tasks assessed                                        General Comments      Exercises Total Joint Exercises Ankle Circles/Pumps: AROM;Strengthening;Both;10  reps;Supine Quad Sets: AROM;Strengthening;Both;10 reps;Supine Heel Slides: AROM;Strengthening;Both;10 reps;Supine Hip ABduction/ADduction: AROM;Strengthening;Both;10 reps;Supine Marching in Standing: AROM;Strengthening;Both;10 reps Other Exercises Other Exercises: Pt educated on roles of PT and services offered during hospital stay.   Assessment/Plan    PT Assessment Patient needs continued PT services  PT Problem List Decreased strength;Decreased activity tolerance;Obesity       PT Treatment Interventions Gait training;Functional mobility training;Therapeutic activities;Therapeutic exercise    PT Goals (Current goals can be found in the Care Plan section)  Acute Rehab PT Goals Patient Stated Goal: "chase [place] women" PT Goal Formulation: With patient Time For Goal Achievement: 06/05/20 Potential to Achieve Goals: Good    Frequency Min 2X/week   Barriers to discharge Decreased caregiver support      Co-evaluation               AM-PAC PT "6 Clicks" Mobility  Outcome Measure Help needed turning from your back to your side while in a flat bed without using bedrails?: None Help needed moving from lying on your back to sitting on the side of a flat bed without using bedrails?: A Little Help needed moving to and from a bed to a chair (including a wheelchair)?: None Help needed standing up from a chair using your arms (e.g., wheelchair or bedside chair)?: None Help needed to walk in hospital room?: A Little Help needed climbing 3-5 steps with a railing? : A Little 6 Click Score: 21    End of Session Equipment Utilized During Treatment: Gait belt Activity Tolerance: Patient tolerated treatment well Patient left: Other (comment) (Pt left with OT for evaluation.) Nurse Communication: Mobility status PT Visit Diagnosis: Muscle weakness (generalized) (M62.81);Difficulty in walking, not elsewhere classified (R26.2)    Time: 6378-5885 PT Time Calculation (min) (ACUTE ONLY):  33 min   Charges:   PT Evaluation $PT Eval Low Complexity: 1 Low PT Treatments $Gait Training: 8-22 mins $Therapeutic Exercise: 8-22 mins        Gwenlyn Saran, PT, DPT 06/09/20, 10:51 AM

## 2020-06-09 NOTE — Progress Notes (Signed)
PROGRESS NOTE    Corey West  JXB:[id number] DOB: 1951/05/17 DOA: 06/06/2020 PCP: Leonel Ramsay, MD   Assessment & Plan:   Principal Problem:   Hepatic encephalopathy Corey West) Active Problems:   Cirrhosis ([place])   Hypokalemia   Obesity, Class III, BMI 40-49.9 (morbid obesity) (Corey West)   Type II diabetes mellitus with renal manifestations (HCC)   CKD (chronic kidney disease) stage 4, GFR 15-29 ml/min (HCC)   Paroxysmal atrial fibrillation (HCC)   Anemia of chronic disease   Prolonged QT interval   Hypoglycemia   Ascites  Hepatic encephalopathy: secondary to cirrhosis. Continue on lactulose, xifaxan   Cirrhosis: with ascites. Continue on lactulose, xifaxan   ACD: will transfuse 1 unit of pRBCs as blood bank will not give the pt 2 units at once and will have to re-check after 1 unit is transfused. Repeat H&H 4 hours post transfusion   Thrombocytopenia: likely secondary to cirrhosis. D/c heparin SQ. Will continue to monitor   Hypokalemia: potassium given. Mg is WNL. Will continue to monitor   Anasarca: secondary to cirrhosis. W/ significant scrotum edema. Continue on torsemide   DM2: well controlled. W/ episodes of hypoglycemia which is now resolved. Continue on SSI w/ accuchecks   CKDIV: Cr continues to trend down daily. Avoid nephrotoxic meds   PAF: not on any rate controlling meds. No anticoagulation possible secondary to increased bleeding risk as pt has cirrhosis   Prolonged QT interval: continue on tele. Avoid medications that will prolong QTc.   Morbid Obesity: BMI 41.7. Would benefit from significant weight loss    DVT prophylaxis: SCDs Code Status: full  Family Communication: Disposition Plan: depends on PT/OT recs   Status is: Inpatient  Remains inpatient appropriate because:Altered mental status, requiring a blood transfusion todsay    Dispo: The patient is from: Home              Anticipated d/c is to: Home vs SNF               Anticipated d/c date is:  3 days              Patient currently is not medically stable to d/c.    Consultants:      Procedures:    Antimicrobials:    Subjective: Pt c/o scrotum swelling   Objective: Vitals:   06/08/20 1647 06/08/20 1927 06/08/20 2043 06/09/20 0445  BP: (!) 138/48 (!) 143/62  131/67  Pulse: 69 (!) 59  67  Resp: 16 20  20   Temp: 98.1 F (36.7 C) 98 F (36.7 C)  98 F (36.7 C)  TempSrc: Oral Oral  Oral  SpO2: 100% 99% 97% 99%  Weight:      Height:        Intake/Output Summary (Last 24 hours) at 06/09/2020 0737 Last data filed at 06/09/2020 0445 Gross per 24 hour  Intake 480 ml  Output 2100 ml  Net -1620 ml   Filed Weights   06/06/20 2203  Weight: 128.3 kg    Examination:  General exam: Appears calm and comfortable  Respiratory system: decreased breath sounds b/l.   Cardiovascular system: irregularly irregular. No rubs, gallops or clicks.  Gastrointestinal system: Abdomen is obese, soft and nontender. Hyperactive bowel sounds heard. Central nervous system: Alert and awake. Moves all 4 extremities  Psychiatry: Judgement and insight appear abnormal. Mood & affect appropriate.     Data Reviewed: I have personally reviewed following labs and imaging studies  CBC: Recent Labs  Lab  06/02/20 1309 06/06/20 2154 06/07/20 0506 06/08/20 0915 06/09/20 0600  WBC 4.8 5.2 5.3 4.1 4.1  NEUTROABS 3.9 4.4  --   --   --   HGB 7.6* 7.4* 7.6* 7.4* 6.8*  HCT 22.3* 22.5* 22.0* 23.2* 21.3*  MCV 92.5 95.3 93.2 99.1 99.1  PLT 79* 80* 80* 71* 64*   Basic Metabolic Panel: Recent Labs  Lab 06/06/20 2154 06/07/20 0506 06/07/20 0655 06/08/20 0915 06/09/20 0600  NA 140 141  --  145 144  K 2.3* 2.6* 2.5* 2.6* 3.1*  CL 96* 97*  --  102 101  CO2 28 29  --  29 29  GLUCOSE 163* 156*  --  148* 195*  BUN 101* 100*  --  95* 91*  CREATININE 2.89* 2.87*  2.95*  --  2.73* 2.65*  CALCIUM 8.7* 8.9  --  8.9 8.7*  MG 2.1  --   --   --  2.1   GFR: Estimated Creatinine Clearance: 35.4  mL/min (A) (by C-G formula based on SCr of 2.65 mg/dL (H)). Liver Function Tests: Recent Labs  Lab 06/06/20 2154 06/08/20 0915 06/09/20 0600  AST 46* 40 32  ALT 31 25 22   ALKPHOS 103 99 85  BILITOT 1.0 1.3* 1.2  PROT 6.6 6.5 5.8*  ALBUMIN 2.8* 2.9* 2.5*   No results for input(s): LIPASE, AMYLASE in the last 168 hours. Recent Labs  Lab 06/06/20 2154 06/07/20 0506 06/08/20 0915 06/09/20 0617  AMMONIA 103* 140* 74* 57*   Coagulation Profile: Recent Labs  Lab 06/06/20 2154  INR 1.4*   Cardiac Enzymes: No results for input(s): CKTOTAL, CKMB, CKMBINDEX, TROPONINI in the last 168 hours. BNP (last 3 results) No results for input(s): PROBNP in the last 8760 hours. HbA1C: No results for input(s): HGBA1C in the last 72 hours. CBG: Recent Labs  Lab 06/07/20 2125 06/08/20 0747 06/08/20 1130 06/08/20 1613 06/08/20 2105  GLUCAP 125* 127* 155* 245* 226*   Lipid Profile: No results for input(s): CHOL, HDL, LDLCALC, TRIG, CHOLHDL, LDLDIRECT in the last 72 hours. Thyroid Function Tests: No results for input(s): TSH, T4TOTAL, FREET4, T3FREE, THYROIDAB in the last 72 hours. Anemia Panel: No results for input(s): VITAMINB12, FOLATE, FERRITIN, TIBC, IRON, RETICCTPCT in the last 72 hours. Sepsis Labs: No results for input(s): PROCALCITON, LATICACIDVEN in the last 168 hours.  Recent Results (from the past 240 hour(s))  SARS Coronavirus 2 by RT PCR (hospital order, performed in [facility] hospital lab) Nasopharyngeal Nasopharyngeal Swab     Status: None   Collection Time: 06/06/20 10:10 PM   Specimen: Nasopharyngeal Swab  Result Value Ref Range Status   SARS Coronavirus 2 NEGATIVE NEGATIVE Final    Comment: (NOTE) SARS-CoV-2 target nucleic acids are NOT DETECTED.  The SARS-CoV-2 RNA is generally detectable in upper and lower respiratory specimens during the acute phase of infection. The lowest concentration of SARS-CoV-2 viral copies this assay can detect is 250 copies / mL.  A negative result does not preclude SARS-CoV-2 infection and should not be used as the sole basis for treatment or other patient management decisions.  A negative result may occur with improper specimen collection / handling, submission of specimen other than nasopharyngeal swab, presence of viral mutation(s) within the areas targeted by this assay, and inadequate number of viral copies (<250 copies / mL). A negative result must be combined with clinical observations, patient history, and epidemiological information.  Fact Sheet for Patients:   StrictlyIdeas.no  Fact Sheet for Healthcare Providers: BankingDealers.co.za  This  test is not yet approved or  cleared by the [place] and has been authorized for detection and/or diagnosis of SARS-CoV-2 by FDA under an Emergency Use Authorization (EUA).  This EUA will remain in effect (meaning this test can be used) for the duration of the COVID-19 declaration under Section 564(b)(1) of the Act, 21 U.S.C. section 360bbb-3(b)(1), unless the authorization is terminated or revoked sooner.  Performed at [facility], [address] [place],  [address]          Radiology Studies: No results found.      Scheduled Meds: . sodium chloride   Intravenous Once  . atorvastatin  20 mg Oral Daily  . ferrous sulfate  325 mg Oral Daily  . hydrALAZINE  50 mg Oral BID  . hydrocerin  1 application Topical Daily  . insulin aspart  0-9 Units Subcutaneous TID WC  . ipratropium-albuterol  3 mL Nebulization TID  . lactulose  45 g Oral BID  . metolazone  5 mg Oral Daily  . pantoprazole  40 mg Oral BID  . potassium chloride  40 mEq Oral Once  . rifaximin  550 mg Oral BID  . tamsulosin  0.4 mg Oral QPC supper  . torsemide  100 mg Oral Daily   Continuous Infusions:   LOS: 2 days    Time spent: 34 mins     Wyvonnia Dusky, MD [facility] Pager [phone]  [phone]  If 7PM-7AM, please contact night-coverage [email or web address] 06/09/2020, 7:37 AM

## 2020-06-09 NOTE — Progress Notes (Signed)
[facility] visited pt. w/notary and two witnesses as follow-up from [facility]'s discussion of AD this AM.  Pt. makes niece [name] MPOA; Pt. shared that he would not want to be 'left on machines permanently' today and yesterday, but when [facility] asked pt. why he had not indicated this in Living Will, Pt. said he niece 'knows what to do.'  AD notarized, copied to paper chart and scanned to [facility].  No further needs at this time.

## 2020-06-09 NOTE — Progress Notes (Signed)
Ch visited with Pt as per recommendation by Chaplain.Waters to assist in AD completion. Pt expressed being sad and scared. Ch helped complete Part A and Part B of AD. Ch spoke with niece [name] over the phone who is HPOA to get necessary information. Pt requested prayer. Ch prayed with Pt. Ch let Chaplain.Waters know to get document notarized.

## 2020-06-09 NOTE — TOC Initial Note (Addendum)
Transition of Care [facility]) - Initial/Assessment Note    Patient Details  Name: Corey West MRN: [id number] Date of Birth: [birth year]  Transition of Care [facility]) CM/SW Contact:    Candie Chroman, LCSW Phone Number: 06/09/2020, 9:53 AM  Clinical Narrative:  Readmission prevention screen complete. CSW met with patient. No supports at bedside. CSW introduced role and explained that discharge planning would be discussed. PCP is Adrian Prows, MD at [facility]. Patient drives to his appointments. Pharmacy is [facility]. No issues obtaining medications. Patient is active with [facility] for PT and RN. Patient has a walker and cane at home. No further concerns. CSW encouraged patient to contact CSW as needed. CSW will continue to follow patient for support and facilitate return home when stable. Patient unsure if his niece will be able to pick him up when discharged. If not, will need EMS home. CSW confirmed address on facesheet is correct.    10:52 am: CSW acknowledges consult from 9/7 for SNF placement. PT evaluated patient this morning and recommends to continue with home health.              Expected Discharge Plan: [place] Barriers to Discharge: Continued Medical Work up   Patient Goals and CMS Choice     Choice offered to / list presented to : NA  Expected Discharge Plan and Services Expected Discharge Plan: [place] Choice: Resumption of Svcs/PTA Provider Living arrangements for the past 2 months: [place] Arranged: RN, PT [facility] Agency: [place] ([employer] Date HH Agency Contacted: 06/08/20   Representative spoke with at [place]: Floydene Flock  Prior Living Arrangements/Services Living arrangements for the past 2 months: [place] Lives with:: Self Patient language and need for interpreter reviewed:: Yes Do you feel safe going back to the  place where you live?: Yes      Need for Family Participation in Patient Care: Yes (Comment)   Current home services: DME, Home PT, Home RN Criminal Activity/Legal Involvement Pertinent to Current Situation/Hospitalization: No - Comment as needed  Activities of Daily Living      Permission Sought/Granted Permission sought to share information with : Facility Art therapist granted to share information with : Yes, Verbal Permission Granted     Permission granted to share info w AGENCY: [facility]        Emotional Assessment Appearance:: Appears stated age Attitude/Demeanor/Rapport: Engaged, Gracious Affect (typically observed): Accepting, Appropriate, Calm, Pleasant Orientation: : Oriented to Self, Oriented to Place, Oriented to  Time, Oriented to Situation Alcohol / Substance Use: Not Applicable Psych Involvement: No (comment)  Admission diagnosis:  Hepatic encephalopathy (HCC) [K72.90] Hypokalemia [E87.6] Altered mental status, unspecified altered mental status type [R41.82] Patient Active Problem List   Diagnosis Date Noted  . Anemia of chronic disease 06/07/2020  . Prolonged QT interval 06/07/2020  . Hypoglycemia 06/07/2020  . Ascites 06/07/2020  . Portal hypertensive gastropathy ([place])   . Symptomatic anemia 05/19/2020  . Acute blood loss anemia 05/19/2020  . Acute GI bleeding 05/19/2020  . Bacteremia due to Klebsiella pneumoniae 04/26/2020  . Palliative care by specialist   . DNR (do not resuscitate) discussion   . COPD with chronic bronchitis ([place]) 04/23/2020  . Paroxysmal atrial  fibrillation ([place]) 04/20/2020  . Cellulitis of left leg 02/23/2020  . Contusion of ribs, left, initial encounter 02/23/2020  . Acute hypoxemic respiratory failure ([place]) 02/04/2020  . Volume overload 01/28/2020  . Hyperlipidemia associated with type 2 diabetes mellitus ([place]) 01/28/2020  . Liver cirrhosis ([place])   . Anxiety   . SOB (shortness of breath)   .  Elevated troponin   . CKD (chronic kidney disease) stage 4, GFR 15-29 ml/min (HCC)   . Unstageable pressure ulcer of sacral region ([place]) 11/28/2019  . Chronic diastolic CHF (congestive heart failure) ([place]) 11/27/2019  . Type II diabetes mellitus with renal manifestations ([place]) 11/27/2019  . Cellulitis of left lower extremity 11/27/2019  . Cellulitis of lower extremity 11/27/2019  . Cyst of pancreas 05/18/2019  . HCAP (healthcare-associated pneumonia) 02/03/2019  . Aphasia 01/14/2019  . Acute on chronic renal failure ([place]) 01/14/2019  . Malnutrition of moderate degree 12/03/2018  . C. difficile diarrhea 12/02/2018  . Ankle fracture, left, closed, initial encounter 10/15/2018  . Ankle fracture, right, closed, initial encounter 10/15/2018  . Ankle fracture 10/15/2018  . Pressure injury of skin 07/19/2018  . UTI (urinary tract infection) 07/18/2018  . Acute kidney injury superimposed on CKD ([place]) 01/02/2018  . 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 Goals of care, counseling/discussion   . Palliative care encounter   . Acute hepatic encephalopathy 12/16/2017  . Hepatic encephalopathy ([place]) 10/18/2017  . Hypokalemia 10/09/2017  . Hyperglycemia 10/09/2017  . Sepsis ([place]) 06/12/2017  . Type 2 diabetes mellitus with diabetic nephropathy, with long-term current use of insulin ([place]) 05/29/2017  . Long term (current) use of insulin ([place]) 05/29/2017  . Cellulitis in diabetic foot ([place]) 02/21/2017  . Cellulitis 02/21/2017  . Upper GI bleed/melena 07/20/2015  . Hematemesis 02/08/2015  . Cirrhosis ([place]) 02/08/2015  . Esophageal varices ([place]) 02/08/2015  . Gastroesophageal reflux disease 02/08/2015  . Hyperlipidemia 02/08/2015  . Chronic venous stasis dermatitis of both lower extremities 10/06/2014  . Cutaneous abscess of limb, unspecified 09/17/2014  . Edema 05/25/2014  . Hypertension 05/25/2014  . Obesity, Class III, BMI 40-49.9 (morbid obesity) ([place]) 05/25/2014  . Sleep  apnea 05/25/2014  . Barrett's esophagus 05/25/2014  . Pancytopenia ([place]) 05/25/2014  . History of esophageal varices 05/25/2014  . Iron deficiency anemia 12/04/2013  . Thrombocytopenia ([place]) 12/04/2013  . Splenomegaly 11/04/2013   PCP:  Leonel Ramsay, MD Pharmacy:   [facility], [place] [place] [address] Phone: [phone] Fax: [phone]     Social Determinants of Health (SDOH) Interventions    Readmission Risk Interventions Readmission Risk Prevention Plan 06/09/2020 02/02/2020 12/15/2019  Transportation Screening Complete Complete Complete  PCP or Specialist Appt within 3-5 Days - Complete -  HRI or [place] - Complete -  Social Work Consult for [place] Planning/Counseling - Complete -  Palliative Care Screening - Not Applicable -  Medication Review Press photographer) Complete Complete Complete  PCP or Specialist appointment within 3-5 days of discharge Complete - Complete  HRI or Home Care Consult Complete - Complete  SW Recovery Care/Counseling Consult Complete - -  SW Consult Not Complete Comments - - -  Palliative Care Screening Not Applicable - Not [place] Not Applicable - Not Applicable  Some recent data might be hidden

## 2020-06-10 LAB — COMPREHENSIVE METABOLIC PANEL
ALT: 21 U/L (ref 0–44)
AST: 33 U/L (ref 15–41)
Albumin: 2.5 g/dL — ABNORMAL LOW (ref 3.5–5.0)
Alkaline Phosphatase: 85 U/L (ref 38–126)
Anion gap: 11 (ref 5–15)
BUN: 91 mg/dL — ABNORMAL HIGH (ref 8–23)
CO2: 31 mmol/L (ref 22–32)
Calcium: 8.6 mg/dL — ABNORMAL LOW (ref 8.9–10.3)
Chloride: 99 mmol/L (ref 98–111)
Creatinine, Ser: 2.7 mg/dL — ABNORMAL HIGH (ref 0.61–1.24)
GFR calc Af Amer: 27 mL/min — ABNORMAL LOW (ref >60)
GFR calc non Af Amer: 23 mL/min — ABNORMAL LOW (ref >60)
Glucose, Bld: 174 mg/dL — ABNORMAL HIGH (ref 70–99)
Potassium: 3.2 mmol/L — ABNORMAL LOW (ref 3.5–5.1)
Sodium: 141 mmol/L (ref 135–145)
Total Bilirubin: 1.5 mg/dL — ABNORMAL HIGH (ref 0.3–1.2)
Total Protein: 5.9 g/dL — ABNORMAL LOW (ref 6.5–8.1)

## 2020-06-10 LAB — GLUCOSE, CAPILLARY
Glucose-Capillary: 151 mg/dL — ABNORMAL HIGH (ref 70–99)
Glucose-Capillary: 209 mg/dL — ABNORMAL HIGH (ref 70–99)
Glucose-Capillary: 217 mg/dL — ABNORMAL HIGH (ref 70–99)
Glucose-Capillary: 199 mg/dL — ABNORMAL HIGH (ref 70–99)

## 2020-06-10 LAB — CBC
HCT: 22.7 % — ABNORMAL LOW (ref 39.0–52.0)
Hemoglobin: 7.4 g/dL — ABNORMAL LOW (ref 13.0–17.0)
MCH: 30.7 pg (ref 26.0–34.0)
MCHC: 32.6 g/dL (ref 30.0–36.0)
MCV: 94.2 fL (ref 80.0–100.0)
Platelets: 64 10*3/uL — ABNORMAL LOW (ref 150–400)
RBC: 2.41 MIL/uL — ABNORMAL LOW (ref 4.22–5.81)
RDW: 18.5 % — ABNORMAL HIGH (ref 11.5–15.5)
WBC: 4.1 10*3/uL (ref 4.0–10.5)
nRBC: 0 % (ref 0.0–0.2)

## 2020-06-10 LAB — PREPARE RBC (CROSSMATCH)

## 2020-06-10 LAB — MAGNESIUM: Magnesium: 2.1 mg/dL (ref 1.7–2.4)

## 2020-06-10 LAB — HEMOGLOBIN AND HEMATOCRIT, BLOOD
HCT: 26 % — ABNORMAL LOW (ref 39.0–52.0)
Hemoglobin: 8.6 g/dL — ABNORMAL LOW (ref 13.0–17.0)

## 2020-06-10 LAB — AMMONIA: Ammonia: 104 umol/L — ABNORMAL HIGH (ref 9–35)

## 2020-06-10 MED ORDER — SODIUM CHLORIDE 0.9% IV SOLUTION: Freq: Once | INTRAVENOUS | Status: AC

## 2020-06-10 MED ORDER — POTASSIUM CHLORIDE CRYS ER 20 MEQ PO TBCR
40.0000 meq | EXTENDED_RELEASE_TABLET | Freq: Once | ORAL | Status: AC
  Administered 2020-06-10: 40 meq via ORAL
  Filled 2020-06-10: qty 2

## 2020-06-10 NOTE — Progress Notes (Signed)
Occupational Therapy Treatment Patient Details Name: Corey West MRN: [id number] DOB: 1950-11-22 Today's Date: 06/10/2020    History of present illness Per MD note: Corey West is a 69 y.o. 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 fib not on anticoagulation, insulin-dependent type 2 diabetes, CKD stage IV, hypertension, hyperlipidemia, chronic venous stasis dermatitis and edema bilateral lower extremities, anxiety, and obesity, OSA noncompliant with CPAP who presents to the ED for evaluation of progressive shortness of breath and lightheadedness with hemoglobin of 6 from doctor's office.  Patient has been having melena stool and was scheduled to follow-up with his GI on 8/19.  He denies abdominal pain, hematemesis.  Denies chest pain   OT comments  Corey West was seen for OT treatment on this date. Upon arrival to room pt long sitting in bed eating breakfast, requesting transfer to chair to complete meal. Pt presents as impulsive using high amplitude movements to exit L side of bed and requiring rest breaks. Pt demonstrates good balance seated EOB c single UE support and requires MIN A to don B shoes (crocs) - assist for heel. CGA + RW for SPT bed>recliner - VCs for safety, heavy BUE support on RW, and poor eccentric control noted. Pt verbalized understanding of instruction provided. Pt making progress toward goals. Pt continues to benefit from skilled OT services to maximize return to PLOF and minimize risk of future falls, injury, caregiver burden, and readmission. Will continue to follow POC. Discharge recommendation remains appropriate.    Follow Up Recommendations  Home health OT;Supervision - Intermittent    Equipment Recommendations  None recommended by OT    Recommendations for Other Services      Precautions / Restrictions Precautions Precautions:  Fall Restrictions Weight Bearing Restrictions: No       Mobility Bed Mobility Overal bed mobility: Needs Assistance Bed Mobility: Supine to Sit     Supine to sit: Min guard;HOB elevated     General bed mobility comments: CGA + VCs for safety, moves quickly with high amplitude movements and requires rest breaks   Transfers Overall transfer level: Needs assistance Equipment used: Rolling walker (2 wheeled) Transfers: Sit to/from [facility] Sit to Stand: Min guard Stand pivot transfers: Min guard       General transfer comment: poor eccentric control     Balance Overall balance assessment: Needs assistance Sitting-balance support: Single extremity supported;Feet supported Sitting balance-Leahy Scale: Good     Standing balance support: Bilateral upper extremity supported Standing balance-Leahy Scale: Good Standing balance comment: reliance on RW        ADL either performed or assessed with clinical judgement   ADL Overall ADL's : Needs assistance/impaired       General ADL Comments: Independent self-feeding seated in chair. MIN A don B shoes (crocs) seated EOB. Supervision + single UE support for seated grooming tasks at EOB. MIN A + RW for ADL t/f.       Cognition Arousal/Alertness: Awake/alert Behavior During Therapy: Impulsive;WFL for tasks assessed/performed Overall Cognitive Status: Within Functional Limits for tasks assessed        General Comments: VCs t/o for safety        Exercises Exercises: Other exercises Other Exercises Other Exercises: Pt educated re: safe RW technqiue, importance of mobility for functional strengthening Other Exercises: Self-feeding, sup>sit, sit<>stand, sitting/standing balance/tolerance< SPT     Pertinent Vitals/ Pain  Pain Assessment: No/denies pain   Frequency  Min 1X/week        Progress Toward Goals  OT Goals(current goals can now be found in the care plan section)  Progress towards  OT goals: Progressing toward goals  Acute Rehab OT Goals Patient Stated Goal: to go home OT Goal Formulation: With patient Time For Goal Achievement: 06/23/20 Potential to Achieve Goals: Good ADL Goals Pt Will Perform Grooming: with modified independence;standing Pt Will Transfer to Toilet: with modified independence;ambulating Pt Will Perform Toileting - Clothing Manipulation and hygiene: with modified independence;sit to/from stand  Plan Discharge plan remains appropriate;Frequency remains appropriate    AM-PAC OT "6 Clicks" Daily Activity     Outcome Measure   Help from another person eating meals?: None Help from another person taking care of personal grooming?: A Little Help from another person toileting, which includes using toliet, bedpan, or urinal?: A Little Help from another person bathing (including washing, rinsing, drying)?: A Little Help from another person to put on and taking off regular upper body clothing?: A Little Help from another person to put on and taking off regular lower body clothing?: A Lot 6 Click Score: 18    End of Session Equipment Utilized During Treatment: Rolling walker  OT Visit Diagnosis: Muscle weakness (generalized) (M62.81)   Activity Tolerance Patient tolerated treatment well   Patient Left in chair;with call bell/phone within reach;with chair alarm set (pt educated on reclining for rectal tube )   Nurse Communication          Time: 3013-1438 OT Time Calculation (min): 11 min  Charges: OT General Charges $OT Visit: 1 Visit OT Treatments $Self Care/Home Management : 8-22 mins  Dessie Coma, M.S. OTR/L  06/10/20, 9:37 AM  ascom [phone]

## 2020-06-10 NOTE — Progress Notes (Signed)
PROGRESS NOTE    Corey West  JIR:[id number] DOB: [birth year] DOA: 06/06/2020 PCP: Leonel Ramsay, MD   Assessment & Plan:   Principal Problem:   Hepatic encephalopathy [facility]) Active Problems:   Cirrhosis ([place])   Hypokalemia   Obesity, Class III, BMI 40-49.9 (morbid obesity) ([place])   Type II diabetes mellitus with renal manifestations (HCC)   CKD (chronic kidney disease) stage 4, GFR 15-29 ml/min (HCC)   Paroxysmal atrial fibrillation (HCC)   Anemia of chronic disease   Prolonged QT interval   Hypoglycemia   Ascites  Hepatic encephalopathy: secondary to cirrhosis. Continue on lactulose, xifaxan. Clinically improved & able to answer questions appropriately despite elevated ammonia   Cirrhosis: with ascites. Continue on lactulose, xifaxan   ACD: s/p 1 unit of pRBCs transfused 06/09/20. Will transfuse 1 unit more of pRBCs today. Repeat H&H 4 hours post transfusion   Thrombocytopenia: likely secondary to cirrhosis. D/c heparin SQ. Will continue to monitor   Hypokalemia: KCl given. Will continue to monitor   Anasarca: secondary to cirrhosis. W/ significant scrotum edema. Continue on torsemide   DM2: well controlled. W/ episodes of hypoglycemia which is now resolved. Continue on SSI w/ accuchecks   CKDIV: Cr is labile. Will continue to monitor   PAF: not on any rate controlling meds. No anticoagulation possible secondary to increased bleeding risk as pt has cirrhosis   Prolonged QT interval: continue on tele. Avoid medications that will prolong QTc.   Morbid Obesity: BMI 41.7. Would benefit from significant weight loss    DVT prophylaxis: SCDs Code Status: full  Family Communication: Disposition Plan: will d/c w/ home health   Status is: Inpatient  Remains inpatient appropriate because: will give 1 unit more of pRBCs today. Can likely d/c home w/ home health tomorrow if H&H are stable and/or trending up    Dispo: The patient is from: Home              Anticipated d/c  is to: Home  W/ home health               Anticipated d/c date is: 1 day               Patient currently is not medically stable to d/c.    Consultants:      Procedures:    Antimicrobials:    Subjective: Pt c/o fatigue.   Objective: Vitals:   06/09/20 1431 06/09/20 1743 06/09/20 2010 06/10/20 0442  BP: (!) 132/57 (!) 142/64 (!) 141/57 (!) 136/58  Pulse: 63 71 68 66  Resp: 18 20 20 19   Temp: 98 F (36.7 C) 98.1 F (36.7 C) 98 F (36.7 C) 98 F (36.7 C)  TempSrc: Oral Oral Oral Oral  SpO2: 100% 99% 100% 96%  Weight:      Height:        Intake/Output Summary (Last 24 hours) at 06/10/2020 0746 Last data filed at 06/10/2020 0117 Gross per 24 hour  Intake 850 ml  Output 2000 ml  Net -1150 ml   Filed Weights   06/06/20 2203  Weight: 128.3 kg    Examination: General exam: Appears calm and comfortable  Respiratory system: decreased breath sounds b/l. No rales, rhonchi.  Cardiovascular system: irregularly irregular. No rubs, gallops or clicks.  Gastrointestinal system: Abdomen is obese, soft and nontender. Hypoactive bowel sounds heard. Central nervous system: Alert and awake. Moves all 4 extremities  Psychiatry: Judgement and insight appear normal. Flat mood and affect  Data Reviewed: I have personally reviewed following labs and imaging studies  CBC: Recent Labs  Lab 06/06/20 2154 06/06/20 2154 06/07/20 0506 06/08/20 0915 06/09/20 0600 06/09/20 2153 06/10/20 0436  WBC 5.2  --  5.3 4.1 4.1  --  4.1  NEUTROABS 4.4  --   --   --   --   --   --   HGB 7.4*   < > 7.6* 7.4* 6.8* 7.9* 7.4*  HCT 22.5*   < > 22.0* 23.2* 21.3* 24.6* 22.7*  MCV 95.3  --  93.2 99.1 99.1  --  94.2  PLT 80*  --  80* 71* 64*  --  64*   < > = values in this interval not displayed.   Basic Metabolic Panel: Recent Labs  Lab 06/06/20 2154 06/06/20 2154 06/07/20 0506 06/07/20 0655 06/08/20 0915 06/09/20 0600 06/10/20 0436  NA 140  --  141  --  145 144 141  K 2.3*   < >  2.6* 2.5* 2.6* 3.1* 3.2*  CL 96*  --  97*  --  102 101 99  CO2 28  --  29  --  29 29 31   GLUCOSE 163*  --  156*  --  148* 195* 174*  BUN 101*  --  100*  --  95* 91* 91*  CREATININE 2.89*  --  2.87*  2.95*  --  2.73* 2.65* 2.70*  CALCIUM 8.7*  --  8.9  --  8.9 8.7* 8.6*  MG 2.1  --   --   --   --  2.1 2.1   < > = values in this interval not displayed.   GFR: Estimated Creatinine Clearance: 34.7 mL/min (A) (by C-G formula based on SCr of 2.7 mg/dL (H)). Liver Function Tests: Recent Labs  Lab 06/06/20 2154 06/08/20 0915 06/09/20 0600 06/10/20 0436  AST 46* 40 32 33  ALT 31 25 22 21   ALKPHOS 103 99 85 85  BILITOT 1.0 1.3* 1.2 1.5*  PROT 6.6 6.5 5.8* 5.9*  ALBUMIN 2.8* 2.9* 2.5* 2.5*   No results for input(s): LIPASE, AMYLASE in the last 168 hours. Recent Labs  Lab 06/06/20 2154 06/07/20 0506 06/08/20 0915 06/09/20 0617 06/10/20 0436  AMMONIA 103* 140* 74* 57* 104*   Coagulation Profile: Recent Labs  Lab 06/06/20 2154  INR 1.4*   Cardiac Enzymes: No results for input(s): CKTOTAL, CKMB, CKMBINDEX, TROPONINI in the last 168 hours. BNP (last 3 results) No results for input(s): PROBNP in the last 8760 hours. HbA1C: No results for input(s): HGBA1C in the last 72 hours. CBG: Recent Labs  Lab 06/09/20 0737 06/09/20 1139 06/09/20 1558 06/09/20 2139 06/10/20 0741  GLUCAP 175* 219* 214* 204* 151*   Lipid Profile: No results for input(s): CHOL, HDL, LDLCALC, TRIG, CHOLHDL, LDLDIRECT in the last 72 hours. Thyroid Function Tests: No results for input(s): TSH, T4TOTAL, FREET4, T3FREE, THYROIDAB in the last 72 hours. Anemia Panel: No results for input(s): VITAMINB12, FOLATE, FERRITIN, TIBC, IRON, RETICCTPCT in the last 72 hours. Sepsis Labs: No results for input(s): PROCALCITON, LATICACIDVEN in the last 168 hours.  Recent Results (from the past 240 hour(s))  SARS Coronavirus 2 by RT PCR (hospital order, performed in [facility] hospital lab) Nasopharyngeal  Nasopharyngeal Swab     Status: None   Collection Time: 06/06/20 10:10 PM   Specimen: Nasopharyngeal Swab  Result Value Ref Range Status   SARS Coronavirus 2 NEGATIVE NEGATIVE Final    Comment: (NOTE) SARS-CoV-2 target nucleic acids are NOT  DETECTED.  The SARS-CoV-2 RNA is generally detectable in upper and lower respiratory specimens during the acute phase of infection. The lowest concentration of SARS-CoV-2 viral copies this assay can detect is 250 copies / mL. A negative result does not preclude SARS-CoV-2 infection and should not be used as the sole basis for treatment or other patient management decisions.  A negative result may occur with improper specimen collection / handling, submission of specimen other than nasopharyngeal swab, presence of viral mutation(s) within the areas targeted by this assay, and inadequate number of viral copies (<250 copies / mL). A negative result must be combined with clinical observations, patient history, and epidemiological information.  Fact Sheet for Patients:   StrictlyIdeas.no  Fact Sheet for Healthcare Providers: BankingDealers.co.za  This test is not yet approved or  cleared by the [place] FDA and has been authorized for detection and/or diagnosis of SARS-CoV-2 by FDA under an Emergency Use Authorization (EUA).  This EUA will remain in effect (meaning this test can be used) for the duration of the COVID-19 declaration under Section 564(b)(1) of the Act, 21 U.S.C. section 360bbb-3(b)(1), unless the authorization is terminated or revoked sooner.  Performed at [facility], [address], [place], [place] [address]          Radiology Studies: No results found.      Scheduled Meds: . atorvastatin  20 mg Oral Daily  . ferrous sulfate  325 mg Oral Daily  . hydrALAZINE  50 mg Oral BID  . hydrocerin  1 application Topical Daily  . insulin aspart  0-9 Units  Subcutaneous TID WC  . lactulose  45 g Oral BID  . metolazone  5 mg Oral Daily  . pantoprazole  40 mg Oral BID  . rifaximin  550 mg Oral BID  . tamsulosin  0.4 mg Oral QPC supper  . torsemide  100 mg Oral Daily   Continuous Infusions:   LOS: 3 days    Time spent: 31 mins     Wyvonnia Dusky, MD [facility] Pager [phone]  If 7PM-7AM, please contact night-coverage [email or web address] 06/10/2020, 7:46 AM

## 2020-06-10 NOTE — Progress Notes (Signed)
Physical Therapy Treatment Patient Details Name: Corey West MRN: [id number] DOB: [birth year] Today's Date: 06/10/2020    History of Present Illness Per MD note: Corey West is a 69 y.o. 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 fib not on anticoagulation, insulin-dependent type 2 diabetes, CKD stage IV, hypertension, hyperlipidemia, chronic venous stasis dermatitis and edema bilateral lower extremities, anxiety, and obesity, OSA noncompliant with CPAP who presents to the ED for evaluation of progressive shortness of breath and lightheadedness with hemoglobin of 6 from doctor's office.  Patient has been having melena stool and was scheduled to follow-up with his GI on 8/19.  He denies abdominal pain, hematemesis.  Denies chest pain    PT Comments    Upon arrival to room, patient was sitting in recliner chair. At first, he was hesitant to work with PT due to pending blood transfusion, but agreed to therapy after talking with him. He has no difficulty standing up but has little eccentric control sitting back down. He requires verbal cues for safety measures. Pt ambulated 230 feet without minimal difficulty with CGA and RW. He ambulates with wide base of support and BLE externally rotated. He took some breaks throughout walk with decreased gait speed. Pt making progress toward goals. Pt continues to benefit from skilled PT services to maximize return to PLOF and minimize risk of future falls, injury, and readmission. Will continue to follow POC. Discharge recommendation remains appropriate.    Follow Up Recommendations  Home health PT     Equipment Recommendations  None recommended by PT    Recommendations for Other Services       Precautions / Restrictions Precautions Precautions: Fall Restrictions Weight Bearing Restrictions: No    Mobility  Bed Mobility Overal  bed mobility: Needs Assistance Bed Mobility: Sit to Supine       Sit to supine: Min guard   General bed mobility comments: min A for BLE, recieved in chair to start session  Transfers Overall transfer level: Needs assistance Equipment used: Rolling walker (2 wheeled) Transfers: Sit to/from Stand Sit to Stand: Min guard         General transfer comment: Able to stand up with supervision, WBOS, takes extended time  Ambulation/Gait Ambulation/Gait assistance: Modified independent (Device/Increase time);Min guard Gait Distance (Feet): 230 Feet Assistive device: Rolling walker (2 wheeled) Gait Pattern/deviations: Step-through pattern;Wide base of support Gait velocity: Decreased   General Gait Details: Feet externally rotated, took some rest breaks throughout walk   Stairs             Wheelchair Mobility    Modified Rankin (Stroke Patients Only)       Balance Overall balance assessment: Modified Independent Sitting-balance support: Feet supported Sitting balance-Leahy Scale: Good     Standing balance support: Bilateral upper extremity supported Standing balance-Leahy Scale: Good Standing balance comment: reliance on RW                            Cognition Arousal/Alertness: Awake/alert Behavior During Therapy: Impulsive;WFL for tasks assessed/performed Overall Cognitive Status: Within Functional Limits for tasks assessed                                 General Comments: VCs t/o for safety      Exercises Other Exercises Other Exercises:  Sitting Marches x 15 each LE, patient deferred further therex and was anxious to ambulate in hallway    General Comments        Pertinent Vitals/Pain Pain Assessment: No/denies pain    Home Living                      Prior Function            PT Goals (current goals can now be found in the care plan section) Acute Rehab PT Goals Patient Stated Goal: to go home PT Goal  Formulation: With patient Time For Goal Achievement: 06/05/20 Potential to Achieve Goals: Good Progress towards PT goals: Progressing toward goals    Frequency    Min 2X/week      PT Plan Current plan remains appropriate    Co-evaluation              AM-PAC PT "6 Clicks" Mobility   Outcome Measure  Help needed turning from your back to your side while in a flat bed without using bedrails?: None Help needed moving from lying on your back to sitting on the side of a flat bed without using bedrails?: A Little Help needed moving to and from a bed to a chair (including a wheelchair)?: None Help needed standing up from a chair using your arms (e.g., wheelchair or bedside chair)?: None Help needed to walk in hospital room?: A Little Help needed climbing 3-5 steps with a railing? : A Little 6 Click Score: 21    End of Session Equipment Utilized During Treatment: Gait belt Activity Tolerance: Patient tolerated treatment well Patient left: in bed;with call bell/phone within reach;with bed alarm set Nurse Communication: Mobility status PT Visit Diagnosis: Muscle weakness (generalized) (M62.81);Difficulty in walking, not elsewhere classified (R26.2)     Time: 1275-1700 PT Time Calculation (min) (ACUTE ONLY): 28 min  Charges:  $Gait Training: 23-37 mins                     Kimmie Jacelyn Grip, SPT Bernita Raisin 06/10/2020, 2:17 PM

## 2020-06-10 NOTE — Care Management Important Message (Signed)
Important Message  Patient Details  Name: Corey West MRN: [id number] Date of Birth: [birth year]   Medicare Important Message Given:  Yes     Dannette Barbara 06/10/2020, 1:40 PM

## 2020-06-11 LAB — CBC
HCT: 26.9 % — ABNORMAL LOW (ref 39.0–52.0)
Hemoglobin: 9.3 g/dL — ABNORMAL LOW (ref 13.0–17.0)
MCH: 31.1 pg (ref 26.0–34.0)
MCHC: 34.6 g/dL (ref 30.0–36.0)
MCV: 90 fL (ref 80.0–100.0)
Platelets: 58 10*3/uL — ABNORMAL LOW (ref 150–400)
RBC: 2.99 MIL/uL — ABNORMAL LOW (ref 4.22–5.81)
RDW: 18 % — ABNORMAL HIGH (ref 11.5–15.5)
WBC: 4.1 10*3/uL (ref 4.0–10.5)
nRBC: 0 % (ref 0.0–0.2)

## 2020-06-11 LAB — MAGNESIUM: Magnesium: 2 mg/dL (ref 1.7–2.4)

## 2020-06-11 LAB — GLUCOSE, CAPILLARY: Glucose-Capillary: 158 mg/dL — ABNORMAL HIGH (ref 70–99)

## 2020-06-11 LAB — AMMONIA: Ammonia: 118 umol/L — ABNORMAL HIGH (ref 9–35)

## 2020-06-11 MED ORDER — FERROUS SULFATE 325 (65 FE) MG PO TABS
325.0000 mg | ORAL_TABLET | Freq: Every day | ORAL | 1 refills | Status: DC
Start: 2020-06-11 — End: 2020-09-12

## 2020-06-11 NOTE — Progress Notes (Signed)
Corey West to be D/C'd home per MD order.  Discussed prescriptions and follow up appointments with the patient. Prescriptions given to patient, medication list explained in detail. Pt verbalized understanding.  Allergies as of 06/11/2020   No Known Allergies      Medication List     TAKE these medications    atorvastatin 20 MG tablet Commonly known as: LIPITOR Take 20 mg by mouth every evening.   Combivent Respimat 20-100 MCG/ACT Aers respimat Generic drug: Ipratropium-Albuterol Inhale 1 puff into the lungs every 6 (six) hours. What changed:  when to take this reasons to take this   ferrous sulfate 325 (65 FE) MG tablet Take 1 tablet (325 mg total) by mouth daily. This medication can cause black stools What changed: additional instructions   furosemide 40 MG tablet Commonly known as: LASIX Take 40-160 mg by mouth daily.   hydrALAZINE 50 MG tablet Commonly known as: APRESOLINE Take 1 tablet (50 mg total) by mouth 2 (two) times daily.   lactulose 10 GM/15ML solution Commonly known as: CHRONULAC Take 90 mLs (60 g total) by mouth daily. What changed:  how much to take when to take this   LORazepam 0.5 MG tablet Commonly known as: ATIVAN Take 1 tablet (0.5 mg total) by mouth 3 (three) times daily as needed for Anxiety   metolazone 5 MG tablet Commonly known as: ZAROXOLYN Take 5 mg by mouth daily. .   NovoLOG Mix 70/30 FlexPen (70-30) 100 UNIT/ML FlexPen Generic drug: insulin aspart protamine - aspart 0-84 Units as directed. Inject 0 - 84 Units (sliding scale) Subcutaneously twice a day before meals, at breakfast and at dinner.   pantoprazole 40 MG tablet Commonly known as: Protonix Take 1 tablet (40 mg total) by mouth 2 (two) times daily.   Potassium Chloride ER 20 MEQ Tbcr Take 20 mEq by mouth daily.   rifaximin 550 MG Tabs tablet Commonly known as: XIFAXAN Take 1 tablet (550 mg total) by mouth 2 (two) times daily.   sodium chloride 0.65 % Soln nasal  spray Commonly known as: OCEAN Place 1 spray into both nostrils as needed for congestion (nose irritation).   tamsulosin 0.4 MG Caps capsule Commonly known as: FLOMAX Take 1 capsule (0.4 mg total) by mouth daily after supper.   torsemide 100 MG tablet Commonly known as: DEMADEX Take 1 tablet (100 mg total) by mouth daily.   traMADol 50 MG tablet Commonly known as: ULTRAM Take 50 mg by mouth every 6 (six) hours as needed.               Discharge Care Instructions  (From admission, onward)           Start     Ordered   06/11/20 0000  Discharge wound care:       Comments: Wound care as stated above   06/11/20 1042            Vitals:   06/10/20 1946 06/11/20 0426  BP: (!) 155/56 (!) 145/47  Pulse: 63 66  Resp: 18 18  Temp: 98.5 F (36.9 C) 97.9 F (36.6 C)  SpO2: 99% 99%    Skin clean, dry and intact except for cellulitis on bilateral legs. No open wounds. Skin care for legs given and home health will be coming on Monday to wrap his legs. Patient refused to let them be done before discharge. IV catheter discontinued intact. Site without signs and symptoms of complications. Dressing and pressure applied. Pt denies pain at  this time. No complaints noted.  An After Visit Summary was printed and given to the patient. Patient escorted via [place], and D/C home via private auto.  [place] A Faiga Stones

## 2020-06-11 NOTE — Discharge Summary (Signed)
Physician Discharge Summary  LINLEY MOXLEY ZOX:[id number] DOB: 04-07-1951 DOA: 06/06/2020  PCP: Leonel Ramsay, MD  Admit date: 06/06/2020 Discharge date: 06/11/2020  Admitted From: home  Disposition:  Home w/ home health   Recommendations for Outpatient Follow-up:  1. Follow up with PCP in 1 week 2. F/u GI in 2-3 days   Home Health: yes Equipment/Devices:  Discharge Condition: stable  CODE STATUS: full  Diet recommendation: Heart Healthy / Carb Modified    Brief/Interim Summary: HPI was taken from Dr. Tonie Griffith: [name] is a 69 y.o. male with medical history significant for hypertension, hyperlipidemia, cirrhosis with varices, CHF, atrial fibrillation, diabetes mellitus type 2, CKD, and venous stasis dermatitis of lower extremities who presents to the ED for altered mental status. History is limited due to patient's confusion. Presented by EMS. Pt lives alone. Pt was confused when found by fire department with low blood sugar of 27 and was corrected at house by fire department and blood sugar was improved to 307 by time EMS arrived.   Patient remained confused and so he was brought to the ED for further evaluation. Patient currently denies any complaints, states he feels fine but frequently repeats answers to questions. He is able to state his name and knows he is in ER but is unsure of year or his date of birth. He has not had fever. He denies chest pain or palpitations. He denies falling or head trauma. He denies vision change at this time. He states he is not sure what happened today that led to him coming to hospital.   ED Course: Mr. Gorby found to have an elevated ammonia level over 100 in the emergency room.  He also has ascites and scrotal swelling on CT scan with a right inguinal hernia.  CT the head is negative.  Has not had further hypoglycemia in the emergency room.  Hospital service asked to admit for further management of hepatic encephalopathy  Hospital Course from  Dr. Lenna Sciara. Jimmye Norman 9/8-9/11/21: Pt presented w/ encephalopathy secondary hyperammonemia. Pt was given lactulose, xifaxan which improved pt's mental status. Of note, pt's Hb dropped to 6.8 and pt was given 2 units of pRBCs. On the day of d/c pt was found to have a Hb 9.3. Pt did not have any hematemesis, hemoptysis, hematuria, melena or bright red stools during this admission. Also, on pt's recent last admission (Aug 2021) pt had an EGD which found Barrett's esophagus, portal HTN gastropathy. Pt will see his GI doc at [facility] clinic as soon as possible as per pt. PT/OT saw the pt and recommended home health. Home health was set up by CM prior to d/c.  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 Continue on lactulose, xifaxan. Clinically improved & able to answer questions appropriately despite elevated ammonia. Back to baseline   Cirrhosis: with ascites. Continue on lactulose, xifaxan   ACD: s/p 2 units of pRBCs transfused. Will continue to monitor   Thrombocytopenia: likely secondary to cirrhosis. D/c heparin SQ. Will continue to monitor   Hypokalemia: KCl repleted. Will continue  to monitor   Anasarca: secondary to cirrhosis. W/ significant scrotum edema. Continue on torsemide   DM2: well controlled. W/ episodes of hypoglycemia which is now resolved. Continue on SSI w/ accuchecks   CKDIV: Cr is labile. Will continue to monitor   PAF: not on any rate controlling meds. No anticoagulation possible secondary to increased bleeding risk as pt has cirrhosis   Prolonged  QT interval: continue on tele. Avoid medications that will prolong QTc.   Morbid Obesity: BMI 41.7. Would benefit from significant weight loss  Discharge Instructions  Discharge Instructions    Diet - low sodium heart healthy   Complete by: As directed    Discharge instructions   Complete by: As directed    F/u PCP in 1-2 weeks. F/u GI in 2-3 days.   Discharge wound care:   Complete by: As directed    Wound care as stated above   Increase activity slowly   Complete by: As directed      Allergies as of 06/11/2020   No Known Allergies     Medication List    TAKE these medications   atorvastatin 20 MG tablet Commonly known as: LIPITOR Take 20 mg by mouth every evening.   Combivent Respimat 20-100 MCG/ACT Aers respimat Generic drug: Ipratropium-Albuterol Inhale 1 puff into the lungs every 6 (six) hours. What changed:   when to take this  reasons to take this   ferrous sulfate 325 (65 FE) MG tablet Take 1 tablet (325 mg total) by mouth daily. This medication can cause black stools What changed: additional instructions   furosemide 40 MG tablet Commonly known as: LASIX Take 40-160 mg by mouth daily.   hydrALAZINE 50 MG tablet Commonly known as: APRESOLINE Take 1 tablet (50 mg total) by mouth 2 (two) times daily.   lactulose 10 GM/15ML solution Commonly known as: CHRONULAC Take 90 mLs (60 g total) by mouth daily. What changed:   how much to take  when to take this   LORazepam 0.5 MG tablet Commonly known as: ATIVAN Take 1 tablet (0.5 mg total) by mouth 3 (three) times daily as needed for Anxiety   metolazone 5 MG tablet Commonly known as: ZAROXOLYN Take 5 mg by mouth daily. .   NovoLOG Mix 70/30 FlexPen (70-30) 100 UNIT/ML FlexPen Generic drug: insulin aspart protamine - aspart 0-84 Units as directed. Inject 0 - 84 Units (sliding scale) Subcutaneously twice a day before meals, at breakfast and at dinner.   pantoprazole 40 MG tablet Commonly known as:  Protonix Take 1 tablet (40 mg total) by mouth 2 (two) times daily.   Potassium Chloride ER 20 MEQ Tbcr Take 20 mEq by mouth daily.   rifaximin 550 MG Tabs tablet Commonly known as: XIFAXAN Take 1 tablet (550 mg total) by mouth 2 (two) times daily.   sodium chloride 0.65 % Soln nasal spray Commonly known as: OCEAN Place 1 spray into both nostrils as needed for congestion (nose irritation).   tamsulosin 0.4 MG Caps capsule Commonly known as: FLOMAX Take 1 capsule (0.4 mg total) by mouth daily after supper.   torsemide 100 MG tablet Commonly known as: DEMADEX Take 1 tablet (100 mg total) by mouth daily.   traMADol 50 MG tablet Commonly known as: ULTRAM Take 50 mg by mouth every 6 (six) hours as needed.            Discharge Care Instructions  (From admission, onward)         Start  Ordered   06/11/20 0000  Discharge wound care:       Comments: Wound care as stated above   06/11/20 1042          Follow-up Information    Leonel Ramsay, MD Follow up.   Specialty: Infectious Diseases Why: Please schedule hospital follow up appt within 3-5 days of discharge. Contact information: [place] [address] [phone]        Nelva Bush, MD .   Specialty: Cardiology Contact information: [place] [address] [phone]              No Known Allergies  Consultations:     Procedures/Studies: CT Abdomen Pelvis Wo Contrast  Result Date: 06/07/2020 CLINICAL DATA:  Abdominal pain EXAM: CT ABDOMEN AND PELVIS WITHOUT CONTRAST TECHNIQUE: Multidetector CT imaging of the abdomen and pelvis was performed following the standard protocol without IV contrast. COMPARISON:  04/23/2020 FINDINGS: Lower chest: Coronary artery and valvular calcifications. Low-density blood pool compatible with anemia. No acute abnormality. Hepatobiliary: Changes of cirrhosis. Scattered calcifications in the spleen. Small  layering gallstones within the gallbladder, unchanged. Pancreas: No focal abnormality or ductal dilatation. Spleen: Splenomegaly, stable. Adrenals/Urinary Tract: No adrenal abnormality. No focal renal abnormality. No stones or hydronephrosis. Urinary bladder is unremarkable. Stomach/Bowel: Stomach, large and small bowel grossly unremarkable. Vascular/Lymphatic: Aortic atherosclerosis. No evidence of aneurysm or adenopathy. 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 Reproductive: Prostate calcifications. Other: Small amount of perihepatic, perisplenic and pelvic ascites. Moderate to large sized right inguinal hernia containing ascites. Musculoskeletal: No acute bony abnormality. IMPRESSION: Changes of cirrhosis.  Associated splenomegaly and ascites. Coronary artery disease, aortic atherosclerosis. Cholelithiasis. Moderate to large-sized right inguinal hernia containing ascites. Electronically Signed   By: Rolm Baptise M.D.   On: 06/07/2020 00:08   CT Head Wo Contrast  Result Date: 06/06/2020 CLINICAL DATA:  Mental status changes EXAM: CT HEAD WITHOUT CONTRAST TECHNIQUE: Contiguous axial images were obtained from the base of the skull through the vertex without intravenous contrast. COMPARISON:  01/28/2020 FINDINGS: Brain: No acute intracranial abnormality. Specifically, no hemorrhage, hydrocephalus, mass lesion, acute infarction, or significant intracranial injury. Vascular: No hyperdense vessel or unexpected calcification. Skull: No acute calvarial abnormality. Sinuses/Orbits: Visualized paranasal sinuses and mastoids clear. Orbital soft tissues unremarkable. Other: None IMPRESSION: Normal study. Electronically Signed   By: Rolm Baptise M.D.   On: 06/06/2020 22:33   DG Chest Portable 1 View  Result Date: 06/06/2020 CLINICAL DATA:  Altered mental status EXAM: PORTABLE CHEST 1 VIEW COMPARISON:  04/30/2020 FINDINGS: Heart is borderline in size. Interstitial prominence  throughout the lungs. No effusions or acute bony abnormality. IMPRESSION: Diffuse interstitial prominence throughout the lungs could reflect interstitial edema or atypical infection. Electronically Signed   By: Rolm Baptise M.D.   On: 06/06/2020 22:32      Subjective: Pt c/o b/l LE swelling   Discharge Exam: Vitals:   06/10/20 1946 06/11/20 0426  BP: (!) 155/56 (!) 145/47  Pulse: 63 66  Resp: 18 18  Temp: 98.5 F (36.9 C) 97.9 F (36.6 C)  SpO2: 99% 99%   Vitals:   06/10/20 1347 06/10/20 1545 06/10/20 1946 06/11/20 0426  BP: (!) 138/47 (!) 137/53 (!) 155/56 (!) 145/47  Pulse: 66 60 63 66  Resp: 18 18 18 18   Temp: 97.6 F (36.4 C) 98 F (36.7 C) 98.5 F (36.9 C) 97.9 F (36.6 C)  TempSrc: Oral Oral Oral Oral  SpO2: 100% 100% 99% 99%  Weight:      Height:        General: Pt is alert, awake, not in acute distress. Obese Cardiovascular: S1/S2 +, no rubs, no gallops Respiratory: diminished breath sounds b/l. No rales Abdominal: Soft, NT, obese, bowel sounds + Extremities: b/l LE edema no cyanosis    The results of significant diagnostics from this hospitalization (including imaging, microbiology, ancillary and laboratory) are listed below for reference.     Microbiology: Recent Results (from the past 240 hour(s))  SARS Coronavirus 2 by RT PCR (hospital order, performed in [facility] hospital lab) Nasopharyngeal Nasopharyngeal Swab     Status: None   Collection Time: 06/06/20 10:10 PM   Specimen: Nasopharyngeal Swab  Result Value Ref Range Status   SARS Coronavirus 2 NEGATIVE NEGATIVE Final    Comment: (NOTE) SARS-CoV-2 target nucleic acids are NOT DETECTED.  The SARS-CoV-2 RNA is generally detectable in upper and lower respiratory specimens during the acute phase of infection. The lowest concentration of SARS-CoV-2 viral copies this assay can detect is 250 copies / mL. A negative result does not preclude SARS-CoV-2 infection and should not be used as the sole  basis for treatment or other patient management decisions.  A negative result may occur with improper specimen collection / handling, submission of specimen other than nasopharyngeal swab, presence of viral mutation(s) within the areas targeted by this assay, and inadequate number of viral copies (<250 copies / mL). A negative result must be combined with clinical observations, patient history, and epidemiological information.  Fact Sheet for Patients:   StrictlyIdeas.no  Fact Sheet for Healthcare Providers: BankingDealers.co.za  This test is not yet approved or  cleared by the [place] FDA and has been authorized for detection and/or diagnosis of SARS-CoV-2 by FDA under an Emergency Use Authorization (EUA).  This EUA will remain in effect (meaning this test can be used) for the duration of the COVID-19 declaration under Section 564(b)(1) of the Act, 21 U.S.C. section 360bbb-3(b)(1), unless the authorization is terminated or revoked sooner.  Performed at [facility], [address] [place], [place] [address]      Labs: BNP (last 3 results) Recent Labs    04/23/20 1525 06/08/20 0915 06/09/20 0600  BNP 376.4* 343.3* 235.3*   Basic Metabolic Panel: Recent Labs  Lab 06/06/20 2154 06/06/20 2154 06/07/20 0506 06/07/20 0655 06/08/20 0915 06/09/20 0600 06/10/20 0436 06/11/20 0525  NA 140  --  141  --  145 144 141  --   K 2.3*   < > 2.6* 2.5* 2.6* 3.1* 3.2*  --   CL 96*  --  97*  --  102 101 99  --   CO2 28  --  29  --  29 29 31   --   GLUCOSE 163*  --  156*  --  148* 195* 174*  --   BUN 101*  --  100*  --  95* 91* 91*  --   CREATININE 2.89*  --  2.87*  2.95*  --  2.73* 2.65* 2.70*  --   CALCIUM 8.7*  --  8.9  --  8.9 8.7* 8.6*  --   MG 2.1  --   --   --   --  2.1 2.1 2.0   < > = values in this interval not displayed.   Liver Function Tests: Recent Labs  Lab 06/06/20 2154 06/08/20 0915 06/09/20 0600  06/10/20 0436  AST 46* 40 32 33  ALT 31 25 22 21   ALKPHOS 103 99 85 85  BILITOT 1.0 1.3* 1.2 1.5*  PROT 6.6 6.5 5.8* 5.9*  ALBUMIN 2.8* 2.9* 2.5* 2.5*   No results for input(s): LIPASE, AMYLASE in the last 168 hours. Recent Labs  Lab 06/07/20 0506 06/08/20 0915 06/09/20 0617 06/10/20 0436 06/11/20 0525  AMMONIA 140* 74* 57* 104* 118*   CBC: Recent Labs  Lab 06/06/20 2154 06/06/20 2154 06/07/20 0506 06/07/20 0506 06/08/20 0915 06/08/20 0915 06/09/20 0600 06/09/20 2153 06/10/20 0436 06/10/20 1955 06/11/20 0525  WBC 5.2   < > 5.3  --  4.1  --  4.1  --  4.1  --  4.1  NEUTROABS 4.4  --   --   --   --   --   --   --   --   --   --   HGB 7.4*   < > 7.6*   < > 7.4*   < > 6.8* 7.9* 7.4* 8.6* 9.3*  HCT 22.5*   < > 22.0*   < > 23.2*   < > 21.3* 24.6* 22.7* 26.0* 26.9*  MCV 95.3   < > 93.2  --  99.1  --  99.1  --  94.2  --  90.0  PLT 80*   < > 80*  --  71*  --  64*  --  64*  --  58*   < > = values in this interval not displayed.   Cardiac Enzymes: No results for input(s): CKTOTAL, CKMB, CKMBINDEX, TROPONINI in the last 168 hours. BNP: Invalid input(s): POCBNP CBG: Recent Labs  Lab 06/10/20 0741 06/10/20 1210 06/10/20 1730 06/10/20 2159 06/11/20 0740  GLUCAP 151* 209* 217* 199* 158*   D-Dimer No results for input(s): DDIMER in the last 72 hours. Hgb A1c No results for input(s): HGBA1C in the last 72 hours. Lipid Profile No results for input(s): CHOL, HDL, LDLCALC, TRIG, CHOLHDL, LDLDIRECT in the last 72 hours. Thyroid function studies No results for input(s): TSH, T4TOTAL, T3FREE, THYROIDAB in the last 72 hours.  Invalid input(s): FREET3 Anemia work up No results for input(s): VITAMINB12, FOLATE, FERRITIN, TIBC, IRON, RETICCTPCT in the last 72 hours. Urinalysis    Component Value Date/Time   COLORURINE STRAW (A) 06/06/2020 2235   APPEARANCEUR CLEAR (A) 06/06/2020 2235   APPEARANCEUR Clear 09/10/2014 0456   LABSPEC 1.006 06/06/2020 2235   LABSPEC 1.016  09/10/2014 0456   PHURINE 6.0 06/06/2020 2235   GLUCOSEU NEGATIVE 06/06/2020 2235   GLUCOSEU Negative 09/10/2014 0456   HGBUR NEGATIVE 06/06/2020 2235   BILIRUBINUR NEGATIVE 06/06/2020 2235   BILIRUBINUR Negative 09/10/2014 0456   KETONESUR NEGATIVE 06/06/2020 2235   PROTEINUR 30 (A) 06/06/2020 2235   NITRITE NEGATIVE 06/06/2020 2235   LEUKOCYTESUR NEGATIVE 06/06/2020 2235   LEUKOCYTESUR Negative 09/10/2014 0456   Sepsis Labs Invalid input(s): PROCALCITONIN,  WBC,  LACTICIDVEN Microbiology Recent Results (from the past 240 hour(s))  SARS Coronavirus 2 by RT PCR (hospital order, performed in [place] hospital lab) Nasopharyngeal Nasopharyngeal Swab     Status: None   Collection Time: 06/06/20 10:10 PM   Specimen: Nasopharyngeal Swab  Result Value Ref Range Status   SARS Coronavirus 2 NEGATIVE NEGATIVE Final    Comment: (NOTE) SARS-CoV-2 target nucleic acids are NOT DETECTED.  The SARS-CoV-2 RNA is generally detectable in upper and lower respiratory specimens during the acute phase of infection. The lowest concentration of SARS-CoV-2 viral copies this assay can detect is 250 copies / mL. A negative  result does not preclude SARS-CoV-2 infection and should not be used as the sole basis for treatment or other patient management decisions.  A negative result may occur with improper specimen collection / handling, submission of specimen other than nasopharyngeal swab, presence of viral mutation(s) within the areas targeted by this assay, and inadequate number of viral copies (<250 copies / mL). A negative result must be combined with clinical observations, patient history, and epidemiological information.  Fact Sheet for Patients:   StrictlyIdeas.no  Fact Sheet for Healthcare Providers: BankingDealers.co.za  This test is not yet approved or  cleared by the [place] FDA and has been authorized for detection and/or diagnosis of  SARS-CoV-2 by FDA under an Emergency Use Authorization (EUA).  This EUA will remain in effect (meaning this test can be used) for the duration of the COVID-19 declaration under Section 564(b)(1) of the Act, 21 U.S.C. section 360bbb-3(b)(1), unless the authorization is terminated or revoked sooner.  Performed at [facility], [address] [place], [place] [address]      Time coordinating discharge: Over 30 minutes  SIGNED:   Wyvonnia Dusky, MD  [facility] 06/11/2020, 12:42 PM Pager   If 7PM-7AM, please contact night-coverage [email or web address]

## 2020-06-11 NOTE — Progress Notes (Signed)
Patient states he will have his legs wrapped when he goes home, no draining wounds noted

## 2020-06-12 LAB — BPAM RBC
Blood Product Expiration Date: 202109192359
Blood Product Expiration Date: 202109202359
ISSUE DATE / TIME: 202109091407
ISSUE DATE / TIME: 202109101325
Unit Type and Rh: 600
Unit Type and Rh: 600

## 2020-06-12 LAB — TYPE AND SCREEN
ABO/RH(D): A NEG
Antibody Screen: NEGATIVE
Unit division: 0
Unit division: 0

## 2020-06-13 ENCOUNTER — Telehealth (HOSPITAL_COMMUNITY): Payer: Self-pay

## 2020-06-13 NOTE — ACP (Advance Care Planning) (Signed)
Pt. makes niece [name] MPOA; Pt. shared that he would not want to be 'left on machines permanently' today and yesterday, but when [facility] asked pt. why he had not indicated this in Living Will, Pt. said he niece 'knows what to do.'  AD notarized, copied to paper chart and scanned to [facility].

## 2020-06-13 NOTE — Telephone Encounter (Signed)
Contacted Francisca since he was just discharged from hospital.  He states paranoid about falling at his front steps.  He has about 3 to get up into his home.  He states PT coming today to work with him.  He states was able to get up them just fine when he got home yesterday.  He states very tired and weak still.  He is aware of up coming appts.  He states his sugars has been in 100's today.  He has not weighed.  He states fluid about the same.  He states nurse just left that wraps his legs.  He has grocery being delivered today.  He has all his medications.  He has no concern of being in his home alone, he states getting around well.  He denies any chest pain, headaches or dizziness.  He does get chortness of breath upon exertion.  Tried to set a time and date to visit but he states he has so many appts this week.  Will call him on Thursday to see if can visit.  Will continue to visit for heart failure.   [place] [phone]

## 2020-06-15 ENCOUNTER — Other Ambulatory Visit
Admission: RE | Admit: 2020-06-15 | Discharge: 2020-06-15 | Disposition: A | Discharge status: Home / Self Care | Payer: Medicare Other | Source: Ambulatory Visit | Attending: Infectious Diseases | Admitting: Infectious Diseases

## 2020-06-15 DIAGNOSIS — N183 Chronic kidney disease, stage 3 unspecified: Secondary | ICD-10-CM | POA: Diagnosis present

## 2020-06-15 LAB — AMMONIA: Ammonia: 50 umol/L — ABNORMAL HIGH (ref 9–35)

## 2020-06-16 ENCOUNTER — Other Ambulatory Visit: Payer: Self-pay

## 2020-06-16 ENCOUNTER — Emergency Department: Payer: Medicare Other

## 2020-06-16 DIAGNOSIS — R4182 Altered mental status, unspecified: Secondary | ICD-10-CM | POA: Diagnosis not present

## 2020-06-16 DIAGNOSIS — Z5321 Procedure and treatment not carried out due to patient leaving prior to being seen by health care provider: Secondary | ICD-10-CM | POA: Diagnosis not present

## 2020-06-16 DIAGNOSIS — R079 Chest pain, unspecified: Secondary | ICD-10-CM | POA: Insufficient documentation

## 2020-06-16 DIAGNOSIS — G319 Degenerative disease of nervous system, unspecified: Secondary | ICD-10-CM | POA: Insufficient documentation

## 2020-06-16 LAB — COMPREHENSIVE METABOLIC PANEL
ALT: 26 U/L (ref 0–44)
AST: 41 U/L (ref 15–41)
Albumin: 2.9 g/dL — ABNORMAL LOW (ref 3.5–5.0)
Alkaline Phosphatase: 83 U/L (ref 38–126)
Anion gap: 11 (ref 5–15)
BUN: 81 mg/dL — ABNORMAL HIGH (ref 8–23)
CO2: 28 mmol/L (ref 22–32)
Calcium: 8.7 mg/dL — ABNORMAL LOW (ref 8.9–10.3)
Chloride: 103 mmol/L (ref 98–111)
Creatinine, Ser: 2.65 mg/dL — ABNORMAL HIGH (ref 0.61–1.24)
GFR calc Af Amer: 27 mL/min — ABNORMAL LOW (ref >60)
GFR calc non Af Amer: 24 mL/min — ABNORMAL LOW (ref >60)
Glucose, Bld: 197 mg/dL — ABNORMAL HIGH (ref 70–99)
Potassium: 2.9 mmol/L — ABNORMAL LOW (ref 3.5–5.1)
Sodium: 142 mmol/L (ref 135–145)
Total Bilirubin: 1 mg/dL (ref 0.3–1.2)
Total Protein: 6.6 g/dL (ref 6.5–8.1)

## 2020-06-16 LAB — CBC WITH DIFFERENTIAL/PLATELET
Abs Immature Granulocytes: 0.01 10*3/uL (ref 0.00–0.07)
Basophils Absolute: 0 10*3/uL (ref 0.0–0.1)
Basophils Relative: 1 %
Eosinophils Absolute: 0 10*3/uL (ref 0.0–0.5)
Eosinophils Relative: 0 %
HCT: 25 % — ABNORMAL LOW (ref 39.0–52.0)
Hemoglobin: 8.3 g/dL — ABNORMAL LOW (ref 13.0–17.0)
Immature Granulocytes: 0 %
Lymphocytes Relative: 13 %
Lymphs Abs: 0.5 10*3/uL — ABNORMAL LOW (ref 0.7–4.0)
MCH: 30.6 pg (ref 26.0–34.0)
MCHC: 33.2 g/dL (ref 30.0–36.0)
MCV: 92.3 fL (ref 80.0–100.0)
Monocytes Absolute: 0.4 10*3/uL (ref 0.1–1.0)
Monocytes Relative: 10 %
Neutro Abs: 2.9 10*3/uL (ref 1.7–7.7)
Neutrophils Relative %: 76 %
Platelets: 70 10*3/uL — ABNORMAL LOW (ref 150–400)
RBC: 2.71 MIL/uL — ABNORMAL LOW (ref 4.22–5.81)
RDW: 16.1 % — ABNORMAL HIGH (ref 11.5–15.5)
WBC: 3.8 10*3/uL — ABNORMAL LOW (ref 4.0–10.5)
nRBC: 0 % (ref 0.0–0.2)

## 2020-06-16 LAB — GLUCOSE, CAPILLARY: Glucose-Capillary: 191 mg/dL — ABNORMAL HIGH (ref 70–99)

## 2020-06-16 LAB — PROTIME-INR
INR: 1.3 — ABNORMAL HIGH (ref 0.8–1.2)
Prothrombin Time: 15.3 seconds — ABNORMAL HIGH (ref 11.4–15.2)

## 2020-06-16 LAB — LIPASE, BLOOD: Lipase: 37 U/L (ref 11–51)

## 2020-06-16 LAB — AMMONIA: Ammonia: 80 umol/L — ABNORMAL HIGH (ref 9–35)

## 2020-06-16 NOTE — ED Notes (Signed)
Pt doctor Ola Spurr contacted this RN to report that pt was seen in office today for confusion and AMS. Dr. Ola Spurr states pt has hx of hepatic encephalopathy. When at PCP family refused to come get pt from office. MD states pt had drove himself to office but he felt pt unsafe to drive. Due to family refusal to assist with care, MD states he had to contact ems to transport to to ED. Dr. Ola Spurr had confiscated pt keys and given them to ems, keys are currently in pt chart.

## 2020-06-16 NOTE — ED Triage Notes (Signed)
Pt to ED, pt poor historian, states he was sent by Dr Ola Spurr but states he was "just supposed to be in the hospital, not the emergency room.  Pt disoriented to time, situation, place.  Equal grip and strength.  Per nephew, pt is usually A&O at baseline.   Pt speaking to nephew on the phone, stating "I just can't wait here, I need to go buy a new truck"

## 2020-06-17 ENCOUNTER — Emergency Department
Admission: EM | Admit: 2020-06-17 | Discharge: 2020-06-17 | Disposition: A | Discharge status: Home / Self Care | Payer: Medicare Other | Attending: Emergency Medicine | Admitting: Emergency Medicine

## 2020-06-17 ENCOUNTER — Ambulatory Visit: Payer: Medicare Other | Admitting: Oncology

## 2020-06-17 ENCOUNTER — Other Ambulatory Visit: Payer: Medicare Other

## 2020-06-17 ENCOUNTER — Ambulatory Visit: Payer: Medicare Other

## 2020-06-17 DIAGNOSIS — R4182 Altered mental status, unspecified: Secondary | ICD-10-CM | POA: Diagnosis not present

## 2020-06-17 LAB — GLUCOSE, CAPILLARY: Glucose-Capillary: 207 mg/dL — ABNORMAL HIGH (ref 70–99)

## 2020-06-17 LAB — URINALYSIS, COMPLETE (UACMP) WITH MICROSCOPIC
Bacteria, UA: NONE SEEN
Bilirubin Urine: NEGATIVE
Glucose, UA: 50 mg/dL — AB
Ketones, ur: NEGATIVE mg/dL
Leukocytes,Ua: NEGATIVE
Nitrite: NEGATIVE
Protein, ur: 100 mg/dL — AB
Specific Gravity, Urine: 1.012 (ref 1.005–1.030)
pH: 6 (ref 5.0–8.0)

## 2020-06-17 LAB — URINE DRUG SCREEN, QUALITATIVE (ARMC ONLY)
Amphetamines, Ur Screen: NOT DETECTED
Barbiturates, Ur Screen: NOT DETECTED
Benzodiazepine, Ur Scrn: NOT DETECTED
Cannabinoid 50 Ng, Ur ~~LOC~~: NOT DETECTED
Cocaine Metabolite,Ur ~~LOC~~: NOT DETECTED
MDMA (Ecstasy)Ur Screen: NOT DETECTED
Methadone Scn, Ur: NOT DETECTED
Opiate, Ur Screen: NOT DETECTED
Phencyclidine (PCP) Ur S: NOT DETECTED
Tricyclic, Ur Screen: NOT DETECTED

## 2020-06-17 NOTE — ED Notes (Signed)
Took patient to the bathroom

## 2020-06-17 NOTE — ED Notes (Signed)
Spoke with pt, states he is going to call his PCP at [facility] this morning,  States he was sent from his PCP yesterday , was told he would be taken straight back to a room when he arrived.

## 2020-06-17 NOTE — ED Notes (Signed)
Patient taken to restroom per patient request. Patient's incontinence brief wet. Patient unable to void. Patient placed in clean brief.

## 2020-06-19 NOTE — Progress Notes (Deleted)
[place]  [phone] [phone]  ID: Corey West OB: 04/21/1951  MR#: [id number]  SNK#:[id number]  Patient Care Team: Leonel Ramsay, MD as PCP - General (Infectious Diseases) End, Harrell Gave, MD as PCP - Cardiology (Cardiology) Baxter Hire, MD (Internal Medicine) Lloyd Huger, MD as Consulting Physician (Oncology)  I connected with Corey West on 06/19/20 at 10:00 AM EDT by video enabled telemedicine visit and verified that I am speaking with the correct person using two identifiers.   I discussed the limitations, risks, security and privacy concerns of performing an evaluation and management service by telemedicine and the availability of in-person appointments. I also discussed with the patient that there may be a patient responsible charge related to this service. The patient expressed understanding and agreed to proceed.   Other persons participating in the visit and their role in the encounter: Patient, MD.  Patient's location: Clinic. Provider's location: Home.  CHIEF COMPLAINT: Anemia, thrombocytopenia  INTERVAL HISTORY: Patient agreed to video assisted telemedicine visit for further evaluation and treatment of his anemia.  He was admitted to the hospital approximately 1 week ago with GI bleed and received at least 1 unit of packed red blood cells.  He feels improved since discharge, but still has chronic weakness and fatigue. He also has multiple chronic medical problems.  He has no neurologic complaints.  He denies any recent fevers or illnesses.  He has a fair appetite, but denies weight loss.  He denies any chest pain, shortness of breath, cough, or hemoptysis. He denies any nausea, vomiting, constipation, or diarrhea.  He denies any further melena or hematochezia.  He has no urinary complaints.  Patient offers no further specific complaints today.  REVIEW OF SYSTEMS:   Review of Systems  Constitutional: Positive for  malaise/fatigue. Negative for fever and weight loss.  Respiratory: Negative.  Negative for cough, hemoptysis and shortness of breath.   Cardiovascular: Negative.  Negative for chest pain and leg swelling.  Gastrointestinal: Negative.  Negative for abdominal pain, blood in stool and melena.  Genitourinary: Negative.  Negative for hematuria.  Musculoskeletal: Negative.  Negative for back pain.  Skin: Negative.  Negative for rash.  Neurological: Positive for weakness. Negative for sensory change, focal weakness and headaches.  Psychiatric/Behavioral: Negative.  The patient is not nervous/anxious.     As per HPI. Otherwise, a complete review of systems is negative.  PAST MEDICAL HISTORY: Past Medical History:  Diagnosis Date  . Anemia   . Anxiety    controlled;   . Arthritis   . AVM (arteriovenous malformation) of stomach, acquired with hemorrhage   . Barrett's esophagus   . CHF (congestive heart failure) ([place])   . Chronic kidney disease    renal infufficiency  . Cirrhosis ([place])   . COPD (chronic obstructive pulmonary disease) ([place])   . Depression    controlled;   Marland Kitchen Diabetes mellitus without complication ([place])    not controlled, taking insulin but sugar continues to run high;   Marland Kitchen Dysrhythmia   . Edema   . Esophageal varices ([place])   . GAVE (gastric antral vascular ectasia)   . GERD (gastroesophageal reflux disease)   . History of hiatal hernia   . Hyperlipidemia   . Hypertension    controlled well;   . Liver cirrhosis ([place])   . Nephropathy, diabetic ([place])   . Obesity   . Pancytopenia ([place])   . Polyp, stomach    with chronic blood loss  .  Sleep apnea    does not wear a cpap, Medicare would not pay for it   . Venous stasis dermatitis of both lower extremities   . Venous stasis of both lower extremities    with cellulitis    PAST SURGICAL HISTORY: Past Surgical History:  Procedure Laterality Date  . ESOPHAGOGASTRODUODENOSCOPY N/A 02/09/2015   Procedure:  ESOPHAGOGASTRODUODENOSCOPY (EGD);  Surgeon: Manya Silvas, MD;  Location: [facility] ENDOSCOPY;  Service: Endoscopy;  Laterality: N/A;  . ESOPHAGOGASTRODUODENOSCOPY N/A 07/22/2015   Procedure: ESOPHAGOGASTRODUODENOSCOPY (EGD);  Surgeon: Manya Silvas, MD;  Location: [facility] ENDOSCOPY;  Service: Endoscopy;  Laterality: N/A;  . ESOPHAGOGASTRODUODENOSCOPY N/A 06/26/2017   Procedure: ESOPHAGOGASTRODUODENOSCOPY (EGD);  Surgeon: Manya Silvas, MD;  Location: [facility] ENDOSCOPY;  Service: Endoscopy;  Laterality: N/A;  . ESOPHAGOGASTRODUODENOSCOPY N/A 06/27/2017   Procedure: ESOPHAGOGASTRODUODENOSCOPY (EGD);  Surgeon: Manya Silvas, MD;  Location: [facility] ENDOSCOPY;  Service: Endoscopy;  Laterality: N/A;  . ESOPHAGOGASTRODUODENOSCOPY N/A 06/28/2017   Procedure: ESOPHAGOGASTRODUODENOSCOPY (EGD);  Surgeon: Manya Silvas, MD;  Location: [facility] ENDOSCOPY;  Service: Endoscopy;  Laterality: N/A;  . ESOPHAGOGASTRODUODENOSCOPY Bilateral 10/11/2017   Procedure: ESOPHAGOGASTRODUODENOSCOPY (EGD);  Surgeon: Manya Silvas, MD;  Location: [facility] ENDOSCOPY;  Service: Endoscopy;  Laterality: Bilateral;  . ESOPHAGOGASTRODUODENOSCOPY N/A 12/04/2017   Procedure: ESOPHAGOGASTRODUODENOSCOPY (EGD);  Surgeon: Manya Silvas, MD;  Location: [facility] ENDOSCOPY;  Service: Endoscopy;  Laterality: N/A;  . ESOPHAGOGASTRODUODENOSCOPY N/A 03/06/2019   Procedure: ESOPHAGOGASTRODUODENOSCOPY (EGD);  Surgeon: Toledo, Benay Pike, MD;  Location: [facility] ENDOSCOPY;  Service: Gastroenterology;  Laterality: N/A;  . ESOPHAGOGASTRODUODENOSCOPY (EGD) WITH PROPOFOL N/A 07/20/2015   Procedure: ESOPHAGOGASTRODUODENOSCOPY (EGD) WITH PROPOFOL;  Surgeon: Manya Silvas, MD;  Location: [facility] ENDOSCOPY;  Service: Endoscopy;  Laterality: N/A;  . ESOPHAGOGASTRODUODENOSCOPY (EGD) WITH PROPOFOL N/A 09/16/2015   Procedure: ESOPHAGOGASTRODUODENOSCOPY (EGD) WITH PROPOFOL;  Surgeon: Manya Silvas, MD;  Location: [facility] ENDOSCOPY;  Service: Endoscopy;  Laterality: N/A;  .  ESOPHAGOGASTRODUODENOSCOPY (EGD) WITH PROPOFOL N/A 03/16/2016   Procedure: ESOPHAGOGASTRODUODENOSCOPY (EGD) WITH PROPOFOL;  Surgeon: Manya Silvas, MD;  Location: [facility] ENDOSCOPY;  Service: Endoscopy;  Laterality: N/A;  . ESOPHAGOGASTRODUODENOSCOPY (EGD) WITH PROPOFOL N/A 09/14/2016   Procedure: ESOPHAGOGASTRODUODENOSCOPY (EGD) WITH PROPOFOL;  Surgeon: Manya Silvas, MD;  Location: [facility] ENDOSCOPY;  Service: Endoscopy;  Laterality: N/A;  . ESOPHAGOGASTRODUODENOSCOPY (EGD) WITH PROPOFOL N/A 11/05/2016   Procedure: ESOPHAGOGASTRODUODENOSCOPY (EGD) WITH PROPOFOL;  Surgeon: Manya Silvas, MD;  Location: [facility] ENDOSCOPY;  Service: Endoscopy;  Laterality: N/A;  . ESOPHAGOGASTRODUODENOSCOPY (EGD) WITH PROPOFOL N/A 02/06/2017   Procedure: ESOPHAGOGASTRODUODENOSCOPY (EGD) WITH PROPOFOL;  Surgeon: Manya Silvas, MD;  Location: [facility] ENDOSCOPY;  Service: Endoscopy;  Laterality: N/A;  . ESOPHAGOGASTRODUODENOSCOPY (EGD) WITH PROPOFOL N/A 04/24/2017   Procedure: ESOPHAGOGASTRODUODENOSCOPY (EGD) WITH PROPOFOL;  Surgeon: Manya Silvas, MD;  Location: [facility] ENDOSCOPY;  Service: Endoscopy;  Laterality: N/A;  . ESOPHAGOGASTRODUODENOSCOPY (EGD) WITH PROPOFOL N/A 07/31/2017   Procedure: ESOPHAGOGASTRODUODENOSCOPY (EGD) WITH PROPOFOL;  Surgeon: Manya Silvas, MD;  Location: [facility] ENDOSCOPY;  Service: Endoscopy;  Laterality: N/A;  . ESOPHAGOGASTRODUODENOSCOPY (EGD) WITH PROPOFOL N/A 08/08/2018   Procedure: ESOPHAGOGASTRODUODENOSCOPY (EGD) WITH PROPOFOL;  Surgeon: Manya Silvas, MD;  Location: [facility] ENDOSCOPY;  Service: Endoscopy;  Laterality: N/A;  . ESOPHAGOGASTRODUODENOSCOPY (EGD) WITH PROPOFOL N/A 05/21/2020   Procedure: ESOPHAGOGASTRODUODENOSCOPY (EGD) WITH PROPOFOL;  Surgeon: [name] Manifold, MD;  Location: [facility] ENDOSCOPY;  Service: Endoscopy;  Laterality: N/A;  . GIVENS CAPSULE STUDY N/A 12/05/2017   Procedure: GIVENS CAPSULE STUDY;  Surgeon: Manya Silvas, MD;  Location: [facility] ENDOSCOPY;  Service:  Endoscopy;  Laterality: N/A;  . TONSILLECTOMY    . TONSILLECTOMY AND ADENOIDECTOMY    . ULNAR NERVE TRANSPOSITION    . UVULOPALATOPHARYNGOPLASTY      FAMILY HISTORY: Family History  Problem Relation Age of Onset  . Diabetes Other   . Transient ischemic attack Father   . CAD Father     ADVANCED DIRECTIVES (Y/N):  N  HEALTH MAINTENANCE: Social History   Tobacco Use  . Smoking status: Former Smoker    Years: 20.00    Types: Cigars, Cigarettes  . Smokeless tobacco: Never Used  Vaping Use  . Vaping Use: Never used  Substance Use Topics  . Alcohol use: No    Comment: stopped 15 years ago  . Drug use: No     Colonoscopy:  PAP:  Bone density:  Lipid panel:  No Known Allergies  Current Outpatient Medications  Medication Sig Dispense Refill  . atorvastatin (LIPITOR) 20 MG tablet Take 20 mg by mouth every evening.     . ferrous sulfate 325 (65 FE) MG tablet Take 1 tablet (325 mg total) by mouth daily. This medication can cause black stools 60 tablet 1  . furosemide (LASIX) 40 MG tablet Take 40-160 mg by mouth daily.    . hydrALAZINE (APRESOLINE) 50 MG tablet Take 1 tablet (50 mg total) by mouth 2 (two) times daily. 60 tablet 1  . Ipratropium-Albuterol (COMBIVENT RESPIMAT) 20-100 MCG/ACT AERS respimat Inhale 1 puff into the lungs every 6 (six) hours. (Patient taking differently: Inhale 1 puff into the lungs every 6 (six) hours as needed. ) 4 g 0  . lactulose (CHRONULAC) 10 GM/15ML solution Take 90 mLs (60 g total) by mouth daily. (Patient taking differently: Take 45 g by mouth 2 (two) times daily. ) 236 mL 0  . LORazepam (ATIVAN) 0.5 MG tablet Take 1 tablet (0.5 mg total) by mouth 3 (three) times daily as needed for Anxiety    . metolazone (ZAROXOLYN) 5 MG tablet Take 5 mg by mouth daily. .    Cira Servant MIX 70/30 FLEXPEN (70-30) 100 UNIT/ML FlexPen 0-84 Units as directed. Inject 0 - 84 Units (sliding scale) Subcutaneously twice a day before meals, at breakfast and at dinner.      . pantoprazole (PROTONIX) 40 MG tablet Take 1 tablet (40 mg total) by mouth 2 (two) times daily. 60 tablet 11  . Potassium Chloride ER 20 MEQ TBCR Take 20 mEq by mouth daily.     . rifaximin (XIFAXAN) 550 MG TABS tablet Take 1 tablet (550 mg total) by mouth 2 (two) times daily. 60 tablet 0  . sodium chloride (OCEAN) 0.65 % SOLN nasal spray Place 1 spray into both nostrils as needed for congestion (nose irritation). 60 mL 0  . tamsulosin (FLOMAX) 0.4 MG CAPS capsule Take 1 capsule (0.4 mg total) by mouth daily after supper. 30 capsule 1  . torsemide (DEMADEX) 100 MG tablet Take 1 tablet (100 mg total) by mouth daily. 30 tablet 1  . traMADol (ULTRAM) 50 MG tablet Take 50 mg by mouth every 6 (six) hours as needed.      No current facility-administered medications for this visit.    OBJECTIVE: There were no vitals filed for this visit.   There is no height or weight on file to calculate BMI.    ECOG FS:0 - Asymptomatic  General: Well-developed, well-nourished, no acute distress. HEENT: Normocephalic. Neuro: Alert, answering all questions appropriately. Cranial nerves grossly intact. Psych: Normal affect.   LAB RESULTS:  Lab Results  Component Value Date   NA 142 06/16/2020   K 2.9 (L) 06/16/2020   CL 103 06/16/2020   CO2 28 06/16/2020   GLUCOSE 197 (H) 06/16/2020   BUN 81 (H) 06/16/2020   CREATININE 2.65 (H) 06/16/2020   CALCIUM 8.7 (L) 06/16/2020   PROT 6.6 06/16/2020   ALBUMIN 2.9 (L) 06/16/2020   AST 41 06/16/2020   ALT 26 06/16/2020   ALKPHOS 83 06/16/2020   BILITOT 1.0 06/16/2020   GFRNONAA 24 (L) 06/16/2020   GFRAA 27 (L) 06/16/2020    Lab Results  Component Value Date   WBC 3.8 (L) 06/16/2020   NEUTROABS 2.9 06/16/2020   HGB 8.3 (L) 06/16/2020   HCT 25.0 (L) 06/16/2020   MCV 92.3 06/16/2020   PLT 70 (L) 06/16/2020   Lab Results  Component Value Date   IRON 57 06/02/2020   TIBC 335 06/02/2020   IRONPCTSAT 17 (L) 06/02/2020   Lab Results  Component Value  Date   FERRITIN 35 06/02/2020     STUDIES: CT Abdomen Pelvis Wo Contrast  Result Date: 06/07/2020 CLINICAL DATA:  Abdominal pain EXAM: CT ABDOMEN AND PELVIS WITHOUT CONTRAST TECHNIQUE: Multidetector CT imaging of the abdomen and pelvis was performed following the standard protocol without IV contrast. COMPARISON:  04/23/2020 FINDINGS: Lower chest: Coronary artery and valvular calcifications. Low-density blood pool compatible with anemia. No acute abnormality. Hepatobiliary: Changes of cirrhosis. Scattered calcifications in the spleen. Small layering gallstones within the gallbladder, unchanged. Pancreas: No focal abnormality or ductal dilatation. Spleen: Splenomegaly, stable. Adrenals/Urinary Tract: No adrenal abnormality. No focal renal abnormality. No stones or hydronephrosis. Urinary bladder is unremarkable. Stomach/Bowel: Stomach, large and small bowel grossly unremarkable. Vascular/Lymphatic: Aortic atherosclerosis. No evidence of aneurysm or adenopathy. 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 Reproductive: Prostate calcifications. Other: Small amount of perihepatic, perisplenic and pelvic ascites. Moderate to large sized right inguinal hernia containing ascites. Musculoskeletal: No acute bony abnormality. IMPRESSION: Changes of cirrhosis.  Associated splenomegaly and ascites. Coronary artery disease, aortic atherosclerosis. Cholelithiasis. Moderate to large-sized right inguinal hernia containing ascites. Electronically Signed   By: Rolm Baptise M.D.   On: 06/07/2020 00:08   CT HEAD WO CONTRAST  Result Date: 06/16/2020 CLINICAL DATA:  Head trauma, minor. Mental status change, unknown cause. EXAM: CT HEAD WITHOUT CONTRAST TECHNIQUE: Contiguous axial images were obtained from the base of the skull through the vertex without intravenous contrast. COMPARISON:  Head CT 06/06/2020. FINDINGS: Brain: Stable, mild generalized parenchymal atrophy. There is no  acute intracranial hemorrhage. No demarcated cortical infarct. No extra-axial fluid collection. No evidence of intracranial mass. No midline shift. Vascular: No hyperdense vessel.  Atherosclerotic calcifications Skull: Normal. Negative for fracture or focal lesion. Sinuses/Orbits: Visualized orbits show no acute finding. Other: Mild ethmoid sinus mucosal thickening. No significant mastoid effusion. IMPRESSION: No evidence of acute intracranial abnormality. Stable, mild generalized parenchymal atrophy. Mild ethmoid sinus mucosal thickening. Electronically Signed   By: Kellie Simmering DO   On: 06/16/2020 18:27   CT Head Wo Contrast  Result Date: 06/06/2020 CLINICAL DATA:  Mental status changes EXAM: CT HEAD WITHOUT CONTRAST TECHNIQUE: Contiguous axial images were obtained from the base of the skull through the vertex without intravenous contrast. COMPARISON:  01/28/2020 FINDINGS: Brain: No acute intracranial abnormality. Specifically, no hemorrhage, hydrocephalus, mass lesion, acute infarction, or significant intracranial injury. Vascular: No hyperdense vessel or unexpected calcification. Skull: No acute calvarial abnormality. Sinuses/Orbits: Visualized paranasal sinuses and mastoids clear. Orbital soft tissues unremarkable. Other: None IMPRESSION: Normal  study. Electronically Signed   By: Rolm Baptise M.D.   On: 06/06/2020 22:33   DG Chest Portable 1 View  Result Date: 06/06/2020 CLINICAL DATA:  Altered mental status EXAM: PORTABLE CHEST 1 VIEW COMPARISON:  04/30/2020 FINDINGS: Heart is borderline in size. Interstitial prominence throughout the lungs. No effusions or acute bony abnormality. IMPRESSION: Diffuse interstitial prominence throughout the lungs could reflect interstitial edema or atypical infection. Electronically Signed   By: Rolm Baptise M.D.   On: 06/06/2020 22:32    ASSESSMENT: Anemia, thrombocytopenia  PLAN:    1.  Anemia: Multifactorial, possibly secondary to chronic renal insufficiency as  well as GI blood loss from his known AV malformations and gastritis.  His most recent EGD was completed on May 21, 2020.  Patient's hemoglobin remains significantly decreased, but improved to 7.6.  His most recent iron stores are within normal limits.  He does not require blood transfusion or iron infusion today, but will proceed with 40,000 units Retacrit today.  Return to clinic in 4 weeks for repeat laboratory work, further evaluation, and continuation of treatment. 2.  Thrombocytopenia: Chronic and unchanged. 3.  Chronic renal insufficiency: Chronic and unchanged.  Patient's most recent creatinine was 2.82 which is approximately his baseline.  Monitor.  I provided 30 minutes of face-to-face video visit time during this encounter which included chart review, counseling, and coordination of care as documented above.    Patient expressed understanding and was in agreement with this plan. He also understands that He can call clinic at any time with any questions, concerns, or complaints.    Lloyd Huger, MD   06/19/2020 10:23 PM

## 2020-06-22 ENCOUNTER — Telehealth: Payer: Self-pay | Admitting: Family

## 2020-06-22 NOTE — Telephone Encounter (Signed)
Incoming call from patient. He reports anxiety about being home alone at this time and is concerned that he may fall. He had social worker come out on Friday and is trying to get him placed at [facility]. Pt has follow up with PCP in the office tomorrow.   Pt does not describe any emergency at this time. He made a call to EMS on sat and they felt it was anxiety.   I have provided the patient with number to adult services to see if he can get in touch with social worker to see about an update on room.   I advised if patient does not feel safe or has fall o any other emergency that he should contact EMS.   Pt denies having any family to call on.   Pt verbalized understanding and is in agreement with POC.

## 2020-06-22 NOTE — Telephone Encounter (Signed)
Patient states he is so weak he cannot function. He states he is "not safe: right now and would like to go to rehab.  States a couple weeks a go he "did not know where I was at". Just states he is very weak and needs help.  Patient states he would like to go to [facility].  Please call to discuss.

## 2020-06-22 NOTE — Telephone Encounter (Signed)
Hi Iva,   I'm not sure I've ever seen Mr. Bier. Looks like he predominantly follows with [name] Hackney, NP and Dr. End. I would recommend he keep follow up with PCP as planned. PCP is generally the best avenue to get into [facility] or similar. Looks like social work has already met with him and HH PT/RN are checking up on him. Not sure what other assistance we can offer in that regard. Hopefully he will have update from primary care tomorrow. Unfortunately these processes take time but it appears that his care team is working as fast as possible.    S , NP  

## 2020-06-23 ENCOUNTER — Inpatient Hospital Stay: Payer: Medicare Other | Admitting: Oncology

## 2020-06-23 ENCOUNTER — Inpatient Hospital Stay: Payer: Medicare Other

## 2020-06-24 ENCOUNTER — Inpatient Hospital Stay: Payer: Medicare Other

## 2020-06-28 ENCOUNTER — Telehealth (HOSPITAL_COMMUNITY): Payer: Self-pay

## 2020-06-28 NOTE — Telephone Encounter (Signed)
Corey West advised he is at [facility] in rehab.  Once he is out advised him to let me know and I will continue to visit for Heart Failure.   [place] [phone]

## 2020-06-29 ENCOUNTER — Telehealth: Payer: Self-pay | Admitting: Family

## 2020-06-29 ENCOUNTER — Ambulatory Visit: Payer: Medicare Other | Admitting: Family

## 2020-06-29 ENCOUNTER — Other Ambulatory Visit
Admission: RE | Admit: 2020-06-29 | Discharge: 2020-06-29 | Disposition: A | Discharge status: Home / Self Care | Payer: Medicare Other | Source: Ambulatory Visit | Attending: Infectious Diseases | Admitting: Infectious Diseases

## 2020-06-29 DIAGNOSIS — Z794 Long term (current) use of insulin: Secondary | ICD-10-CM | POA: Insufficient documentation

## 2020-06-29 DIAGNOSIS — E1121 Type 2 diabetes mellitus with diabetic nephropathy: Secondary | ICD-10-CM | POA: Insufficient documentation

## 2020-06-29 DIAGNOSIS — K7469 Other cirrhosis of liver: Secondary | ICD-10-CM | POA: Insufficient documentation

## 2020-06-29 LAB — AMMONIA: Ammonia: 145 umol/L — ABNORMAL HIGH (ref 9–35)

## 2020-06-29 NOTE — Telephone Encounter (Signed)
Patient did not show for his Heart Failure Clinic appointment on 06/29/20. Will attempt to reschedule.

## 2020-07-06 ENCOUNTER — Other Ambulatory Visit
Admission: RE | Admit: 2020-07-06 | Discharge: 2020-07-06 | Disposition: A | Discharge status: Home / Self Care | Payer: Medicare Other | Source: Ambulatory Visit | Attending: Infectious Diseases | Admitting: Infectious Diseases

## 2020-07-06 DIAGNOSIS — E876 Hypokalemia: Secondary | ICD-10-CM | POA: Diagnosis not present

## 2020-07-06 DIAGNOSIS — E1122 Type 2 diabetes mellitus with diabetic chronic kidney disease: Secondary | ICD-10-CM | POA: Insufficient documentation

## 2020-07-06 DIAGNOSIS — I10 Essential (primary) hypertension: Secondary | ICD-10-CM | POA: Insufficient documentation

## 2020-07-06 DIAGNOSIS — K7200 Acute and subacute hepatic failure without coma: Secondary | ICD-10-CM | POA: Diagnosis not present

## 2020-07-06 DIAGNOSIS — N184 Chronic kidney disease, stage 4 (severe): Secondary | ICD-10-CM | POA: Insufficient documentation

## 2020-07-06 DIAGNOSIS — D500 Iron deficiency anemia secondary to blood loss (chronic): Secondary | ICD-10-CM | POA: Insufficient documentation

## 2020-07-06 LAB — AMMONIA: Ammonia: 102 umol/L — ABNORMAL HIGH (ref 9–35)

## 2020-07-07 ENCOUNTER — Emergency Department: Payer: Medicare Other

## 2020-07-07 ENCOUNTER — Other Ambulatory Visit: Payer: Self-pay

## 2020-07-07 ENCOUNTER — Inpatient Hospital Stay
Admission: EM | Admit: 2020-07-07 | Discharge: 2020-07-09 | DRG: 442 | Disposition: A | Discharge status: Home Health | Payer: Medicare Other | Attending: Internal Medicine | Admitting: Internal Medicine

## 2020-07-07 DIAGNOSIS — Z87891 Personal history of nicotine dependence: Secondary | ICD-10-CM

## 2020-07-07 DIAGNOSIS — J4489 Other specified chronic obstructive pulmonary disease: Secondary | ICD-10-CM | POA: Diagnosis present

## 2020-07-07 DIAGNOSIS — I10 Essential (primary) hypertension: Secondary | ICD-10-CM | POA: Diagnosis present

## 2020-07-07 DIAGNOSIS — E162 Hypoglycemia, unspecified: Secondary | ICD-10-CM | POA: Diagnosis not present

## 2020-07-07 DIAGNOSIS — Z20822 Contact with and (suspected) exposure to covid-19: Secondary | ICD-10-CM | POA: Diagnosis present

## 2020-07-07 DIAGNOSIS — D509 Iron deficiency anemia, unspecified: Secondary | ICD-10-CM | POA: Diagnosis present

## 2020-07-07 DIAGNOSIS — K219 Gastro-esophageal reflux disease without esophagitis: Secondary | ICD-10-CM | POA: Diagnosis present

## 2020-07-07 DIAGNOSIS — I5032 Chronic diastolic (congestive) heart failure: Secondary | ICD-10-CM | POA: Diagnosis present

## 2020-07-07 DIAGNOSIS — E1122 Type 2 diabetes mellitus with diabetic chronic kidney disease: Secondary | ICD-10-CM | POA: Diagnosis present

## 2020-07-07 DIAGNOSIS — E785 Hyperlipidemia, unspecified: Secondary | ICD-10-CM | POA: Diagnosis present

## 2020-07-07 DIAGNOSIS — R6510 Systemic inflammatory response syndrome (SIRS) of non-infectious origin without acute organ dysfunction: Secondary | ICD-10-CM | POA: Diagnosis present

## 2020-07-07 DIAGNOSIS — E11649 Type 2 diabetes mellitus with hypoglycemia without coma: Secondary | ICD-10-CM | POA: Diagnosis present

## 2020-07-07 DIAGNOSIS — J42 Unspecified chronic bronchitis: Secondary | ICD-10-CM | POA: Diagnosis present

## 2020-07-07 DIAGNOSIS — F419 Anxiety disorder, unspecified: Secondary | ICD-10-CM | POA: Diagnosis present

## 2020-07-07 DIAGNOSIS — T68XXXA Hypothermia, initial encounter: Secondary | ICD-10-CM | POA: Diagnosis not present

## 2020-07-07 DIAGNOSIS — J449 Chronic obstructive pulmonary disease, unspecified: Secondary | ICD-10-CM | POA: Diagnosis present

## 2020-07-07 DIAGNOSIS — Z79899 Other long term (current) drug therapy: Secondary | ICD-10-CM | POA: Diagnosis not present

## 2020-07-07 DIAGNOSIS — E1165 Type 2 diabetes mellitus with hyperglycemia: Secondary | ICD-10-CM | POA: Diagnosis not present

## 2020-07-07 DIAGNOSIS — D6959 Other secondary thrombocytopenia: Secondary | ICD-10-CM | POA: Diagnosis present

## 2020-07-07 DIAGNOSIS — R188 Other ascites: Secondary | ICD-10-CM

## 2020-07-07 DIAGNOSIS — K7200 Acute and subacute hepatic failure without coma: Principal | ICD-10-CM | POA: Diagnosis present

## 2020-07-07 DIAGNOSIS — E876 Hypokalemia: Secondary | ICD-10-CM | POA: Diagnosis present

## 2020-07-07 DIAGNOSIS — D649 Anemia, unspecified: Secondary | ICD-10-CM | POA: Diagnosis not present

## 2020-07-07 DIAGNOSIS — R4182 Altered mental status, unspecified: Secondary | ICD-10-CM

## 2020-07-07 DIAGNOSIS — E1129 Type 2 diabetes mellitus with other diabetic kidney complication: Secondary | ICD-10-CM | POA: Diagnosis present

## 2020-07-07 DIAGNOSIS — K7682 Hepatic encephalopathy: Secondary | ICD-10-CM | POA: Diagnosis present

## 2020-07-07 DIAGNOSIS — N184 Chronic kidney disease, stage 4 (severe): Secondary | ICD-10-CM | POA: Diagnosis present

## 2020-07-07 DIAGNOSIS — N183 Chronic kidney disease, stage 3 unspecified: Secondary | ICD-10-CM | POA: Diagnosis present

## 2020-07-07 DIAGNOSIS — K7460 Unspecified cirrhosis of liver: Secondary | ICD-10-CM | POA: Diagnosis present

## 2020-07-07 DIAGNOSIS — E1121 Type 2 diabetes mellitus with diabetic nephropathy: Secondary | ICD-10-CM | POA: Diagnosis present

## 2020-07-07 DIAGNOSIS — E7220 Disorder of urea cycle metabolism, unspecified: Secondary | ICD-10-CM

## 2020-07-07 DIAGNOSIS — I130 Hypertensive heart and chronic kidney disease with heart failure and stage 1 through stage 4 chronic kidney disease, or unspecified chronic kidney disease: Secondary | ICD-10-CM | POA: Diagnosis present

## 2020-07-07 DIAGNOSIS — D696 Thrombocytopenia, unspecified: Secondary | ICD-10-CM | POA: Diagnosis present

## 2020-07-07 DIAGNOSIS — Z6841 Body Mass Index (BMI) 40.0 and over, adult: Secondary | ICD-10-CM | POA: Diagnosis not present

## 2020-07-07 DIAGNOSIS — Z794 Long term (current) use of insulin: Secondary | ICD-10-CM

## 2020-07-07 LAB — APTT: aPTT: 28 seconds (ref 24–36)

## 2020-07-07 LAB — COMPREHENSIVE METABOLIC PANEL
ALT: 30 U/L (ref 0–44)
AST: 40 U/L (ref 15–41)
Albumin: 2.9 g/dL — ABNORMAL LOW (ref 3.5–5.0)
Alkaline Phosphatase: 78 U/L (ref 38–126)
Anion gap: 12 (ref 5–15)
BUN: 77 mg/dL — ABNORMAL HIGH (ref 8–23)
CO2: 27 mmol/L (ref 22–32)
Calcium: 8.9 mg/dL (ref 8.9–10.3)
Chloride: 100 mmol/L (ref 98–111)
Creatinine, Ser: 2.9 mg/dL — ABNORMAL HIGH (ref 0.61–1.24)
GFR calc non Af Amer: 21 mL/min — ABNORMAL LOW (ref >60)
Glucose, Bld: 92 mg/dL (ref 70–99)
Potassium: 2.7 mmol/L — CL (ref 3.5–5.1)
Sodium: 139 mmol/L (ref 135–145)
Total Bilirubin: 0.9 mg/dL (ref 0.3–1.2)
Total Protein: 6.5 g/dL (ref 6.5–8.1)

## 2020-07-07 LAB — CBC WITH DIFFERENTIAL/PLATELET
Abs Immature Granulocytes: 0.02 10*3/uL (ref 0.00–0.07)
Basophils Absolute: 0 10*3/uL (ref 0.0–0.1)
Basophils Relative: 0 %
Eosinophils Absolute: 0 10*3/uL (ref 0.0–0.5)
Eosinophils Relative: 0 %
HCT: 22.3 % — ABNORMAL LOW (ref 39.0–52.0)
Hemoglobin: 8 g/dL — ABNORMAL LOW (ref 13.0–17.0)
Immature Granulocytes: 0 %
Lymphocytes Relative: 7 %
Lymphs Abs: 0.3 10*3/uL — ABNORMAL LOW (ref 0.7–4.0)
MCH: 31.5 pg (ref 26.0–34.0)
MCHC: 35.9 g/dL (ref 30.0–36.0)
MCV: 87.8 fL (ref 80.0–100.0)
Monocytes Absolute: 0.4 10*3/uL (ref 0.1–1.0)
Monocytes Relative: 9 %
Neutro Abs: 3.7 10*3/uL (ref 1.7–7.7)
Neutrophils Relative %: 84 %
Platelets: 69 10*3/uL — ABNORMAL LOW (ref 150–400)
RBC: 2.54 MIL/uL — ABNORMAL LOW (ref 4.22–5.81)
RDW: 14.7 % (ref 11.5–15.5)
WBC: 4.5 10*3/uL (ref 4.0–10.5)
nRBC: 0 % (ref 0.0–0.2)

## 2020-07-07 LAB — PROTIME-INR
INR: 1.4 — ABNORMAL HIGH (ref 0.8–1.2)
Prothrombin Time: 16.5 seconds — ABNORMAL HIGH (ref 11.4–15.2)

## 2020-07-07 LAB — GLUCOSE, CAPILLARY
Glucose-Capillary: 96 mg/dL (ref 70–99)
Glucose-Capillary: 117 mg/dL — ABNORMAL HIGH (ref 70–99)
Glucose-Capillary: 95 mg/dL (ref 70–99)
Glucose-Capillary: 235 mg/dL — ABNORMAL HIGH (ref 70–99)

## 2020-07-07 LAB — URINALYSIS, COMPLETE (UACMP) WITH MICROSCOPIC
Bacteria, UA: NONE SEEN
Bilirubin Urine: NEGATIVE
Glucose, UA: NEGATIVE mg/dL
Hgb urine dipstick: NEGATIVE
Ketones, ur: NEGATIVE mg/dL
Leukocytes,Ua: NEGATIVE
Nitrite: NEGATIVE
Protein, ur: 30 mg/dL — AB
Specific Gravity, Urine: 1.008 (ref 1.005–1.030)
pH: 6 (ref 5.0–8.0)

## 2020-07-07 LAB — URINE DRUG SCREEN, QUALITATIVE (ARMC ONLY)
Amphetamines, Ur Screen: NOT DETECTED
Barbiturates, Ur Screen: NOT DETECTED
Benzodiazepine, Ur Scrn: NOT DETECTED
Cannabinoid 50 Ng, Ur ~~LOC~~: NOT DETECTED
Cocaine Metabolite,Ur ~~LOC~~: NOT DETECTED
MDMA (Ecstasy)Ur Screen: NOT DETECTED
Methadone Scn, Ur: NOT DETECTED
Opiate, Ur Screen: NOT DETECTED
Phencyclidine (PCP) Ur S: NOT DETECTED
Tricyclic, Ur Screen: NOT DETECTED

## 2020-07-07 LAB — MAGNESIUM: Magnesium: 2 mg/dL (ref 1.7–2.4)

## 2020-07-07 LAB — AMMONIA: Ammonia: 189 umol/L — ABNORMAL HIGH (ref 9–35)

## 2020-07-07 LAB — TSH: TSH: 2.116 u[IU]/mL (ref 0.350–4.500)

## 2020-07-07 LAB — TROPONIN I (HIGH SENSITIVITY)
Troponin I (High Sensitivity): 34 ng/L — ABNORMAL HIGH (ref <18)
Troponin I (High Sensitivity): 32 ng/L — ABNORMAL HIGH (ref <18)

## 2020-07-07 LAB — PROCALCITONIN: Procalcitonin: 0.18 ng/mL

## 2020-07-07 LAB — LACTIC ACID, PLASMA
Lactic Acid, Venous: 1.7 mmol/L (ref 0.5–1.9)
Lactic Acid, Venous: 1.4 mmol/L (ref 0.5–1.9)

## 2020-07-07 LAB — T4, FREE: Free T4: 1.11 ng/dL (ref 0.61–1.12)

## 2020-07-07 LAB — BRAIN NATRIURETIC PEPTIDE: B Natriuretic Peptide: 243.7 pg/mL — ABNORMAL HIGH (ref 0.0–100.0)

## 2020-07-07 LAB — RESPIRATORY PANEL BY RT PCR (FLU A&B, COVID)
Influenza A by PCR: NEGATIVE
Influenza B by PCR: NEGATIVE
SARS Coronavirus 2 by RT PCR: NEGATIVE

## 2020-07-07 LAB — ETHANOL: Alcohol, Ethyl (B): <10 mg/dL (ref <10)

## 2020-07-07 MED ORDER — ALBUTEROL SULFATE (2.5 MG/3ML) 0.083% IN NEBU: 2.5000 mg | INHALATION_SOLUTION | RESPIRATORY_TRACT | Status: DC | PRN

## 2020-07-07 MED ORDER — POTASSIUM CHLORIDE 10 MEQ/100ML IV SOLN
10.0000 meq | INTRAVENOUS | Status: AC
  Administered 2020-07-07 (×4): 10 meq via INTRAVENOUS
  Filled 2020-07-07 (×4): qty 100

## 2020-07-07 MED ORDER — SODIUM CHLORIDE 0.9% FLUSH: 3.0000 mL | INTRAVENOUS | Status: DC | PRN

## 2020-07-07 MED ORDER — POTASSIUM CHLORIDE 10 MEQ/100ML IV SOLN
10.0000 meq | Freq: Once | INTRAVENOUS | Status: AC
  Administered 2020-07-07: 10 meq via INTRAVENOUS
  Filled 2020-07-07: qty 100

## 2020-07-07 MED ORDER — DEXTROSE 50 % IV SOLN: 1.0000 | Freq: Once | INTRAVENOUS | Status: DC | PRN

## 2020-07-07 MED ORDER — SODIUM CHLORIDE 0.9 % IV SOLN
2.0000 g | INTRAVENOUS | Status: DC
  Administered 2020-07-07: 2 g via INTRAVENOUS
  Filled 2020-07-07 (×2): qty 20

## 2020-07-07 MED ORDER — DEXTROSE 50 % IV SOLN: 50.0000 mL | INTRAVENOUS | Status: DC | PRN

## 2020-07-07 MED ORDER — ACETAMINOPHEN 650 MG RE SUPP: 650.0000 mg | Freq: Four times a day (QID) | RECTAL | Status: DC | PRN

## 2020-07-07 MED ORDER — HYDRALAZINE HCL 20 MG/ML IJ SOLN: 5.0000 mg | INTRAMUSCULAR | Status: DC | PRN

## 2020-07-07 MED ORDER — LACTULOSE ENEMA
300.0000 mL | Freq: Once | ORAL | Status: DC
  Filled 2020-07-07: qty 300

## 2020-07-07 MED ORDER — LACTULOSE ENEMA
300.0000 mL | Freq: Two times a day (BID) | ORAL | Status: DC
  Administered 2020-07-07: 300 mL via RECTAL
  Filled 2020-07-07 (×3): qty 300

## 2020-07-07 MED ORDER — CHLORHEXIDINE GLUCONATE CLOTH 2 % EX PADS
6.0000 | MEDICATED_PAD | Freq: Every day | CUTANEOUS | Status: DC
  Administered 2020-07-08: 6 via TOPICAL

## 2020-07-07 MED ORDER — ACETAMINOPHEN 325 MG PO TABS: 650.0000 mg | ORAL_TABLET | Freq: Four times a day (QID) | ORAL | Status: DC | PRN

## 2020-07-07 MED ORDER — LACTULOSE 10 GM/15ML PO SOLN
30.0000 g | Freq: Three times a day (TID) | ORAL | Status: DC
  Administered 2020-07-07 – 2020-07-08 (×4): 30 g via ORAL
  Filled 2020-07-07 (×5): qty 60

## 2020-07-07 MED ORDER — SODIUM CHLORIDE 0.9 % IV SOLN
250.0000 mL | INTRAVENOUS | Status: DC | PRN
  Administered 2020-07-08: 250 mL via INTRAVENOUS

## 2020-07-07 MED ORDER — SODIUM CHLORIDE 0.9% FLUSH
3.0000 mL | Freq: Two times a day (BID) | INTRAVENOUS | Status: DC
  Administered 2020-07-07 – 2020-07-09 (×4): 3 mL via INTRAVENOUS

## 2020-07-07 MED ORDER — ONDANSETRON HCL 4 MG PO TABS: 4.0000 mg | ORAL_TABLET | Freq: Four times a day (QID) | ORAL | Status: DC | PRN

## 2020-07-07 MED ORDER — PANTOPRAZOLE SODIUM 40 MG IV SOLR
40.0000 mg | INTRAVENOUS | Status: DC
  Administered 2020-07-07: 40 mg via INTRAVENOUS
  Filled 2020-07-07: qty 40

## 2020-07-07 NOTE — ED Provider Notes (Signed)
[facility] Emergency Department Provider Note   ____________________________________________   First MD Initiated Contact with Patient 07/07/20 [phone]     (approximate)  I have reviewed the triage vital signs and the nursing notes.   HISTORY  Chief Complaint Altered Mental Status  Level of V caveat: Limited by decreased LOC  HPI Corey West is a 69 y.o. male brought to the ED via EMS from home with a chief complaint of altered mentation.  Patient with a history of CHF, CKD, cirrhosis, COPD, diabetes, anemia who was recently discharged from skilled nursing facility and had a follow-up with his PCP yesterday.  Family called EMS for patient with decreased responsiveness.  FSBS in the 60s upon EMS arrival.  Patient was given oral glucose with increase in sugar to the 80s.  Patient arrives to the ED with eyes open spontaneously, alert to voice but verbally unresponsive.     Past Medical History:  Diagnosis Date  . Anemia   . Anxiety    controlled;   . Arthritis   . AVM (arteriovenous malformation) of stomach, acquired with hemorrhage   . Barrett's esophagus   . CHF (congestive heart failure) ([place])   . Chronic kidney disease    renal infufficiency  . Cirrhosis ([place])   . COPD (chronic obstructive pulmonary disease) ([place])   . Depression    controlled;   Marland Kitchen Diabetes mellitus without complication ([place])    not controlled, taking insulin but sugar continues to run high;   Marland Kitchen Dysrhythmia   . Edema   . Esophageal varices ([place])   . GAVE (gastric antral vascular ectasia)   . GERD (gastroesophageal reflux disease)   . History of hiatal hernia   . Hyperlipidemia   . Hypertension    controlled well;   . Liver cirrhosis ([place])   . Nephropathy, diabetic ([place])   . Obesity   . Pancytopenia ([place])   . Polyp, stomach    with chronic blood loss  . Sleep apnea    does not wear a cpap, Medicare would not pay for it   . Venous stasis dermatitis of both lower  extremities   . Venous stasis of both lower extremities    with cellulitis    Patient Active Problem List   Diagnosis Date Noted  . Anemia of chronic disease 06/07/2020  . Prolonged QT interval 06/07/2020  . Hypoglycemia 06/07/2020  . Ascites 06/07/2020  . Portal hypertensive gastropathy ([place])   . Symptomatic anemia 05/19/2020  . Acute blood loss anemia 05/19/2020  . Acute GI bleeding 05/19/2020  . Bacteremia due to Klebsiella pneumoniae 04/26/2020  . Palliative care by specialist   . DNR (do not resuscitate) discussion   . COPD with chronic bronchitis ([place]) 04/23/2020  . Paroxysmal atrial fibrillation ([place]) 04/20/2020  . Cellulitis of left leg 02/23/2020  . Contusion of ribs, left, initial encounter 02/23/2020  . Acute hypoxemic respiratory failure ([place]) 02/04/2020  . Volume overload 01/28/2020  . Hyperlipidemia associated with type 2 diabetes mellitus ([place]) 01/28/2020  . Liver cirrhosis ([place])   . Anxiety   . SOB (shortness of breath)   . Elevated troponin   . CKD (chronic kidney disease) stage 4, GFR 15-29 ml/min (HCC)   . Unstageable pressure ulcer of sacral region ([place]) 11/28/2019  . Chronic diastolic CHF (congestive heart failure) ([place]) 11/27/2019  . Type II diabetes mellitus with renal manifestations ([place]) 11/27/2019  . Cellulitis of left lower extremity 11/27/2019  . Cellulitis of lower extremity 11/27/2019  .  Cyst of pancreas 05/18/2019  . HCAP (healthcare-associated pneumonia) 02/03/2019  . Aphasia 01/14/2019  . Acute on chronic renal failure ([place]) 01/14/2019  . Malnutrition of moderate degree 12/03/2018  . C. difficile diarrhea 12/02/2018  . Ankle fracture, left, closed, initial encounter 10/15/2018  . Ankle fracture, right, closed, initial encounter 10/15/2018  . Ankle fracture 10/15/2018  . Pressure injury of skin 07/19/2018  . UTI (urinary tract infection) 07/18/2018  . Acute kidney injury superimposed on CKD ([place]) 01/02/2018  . GAVE (gastric antral  vascular ectasia) 01/02/2018  . Goals of care, counseling/discussion   . Palliative care encounter   . Acute hepatic encephalopathy 12/16/2017  . Hepatic encephalopathy ([place]) 10/18/2017  . Hypokalemia 10/09/2017  . Hyperglycemia 10/09/2017  . Sepsis ([place]) 06/12/2017  . Type 2 diabetes mellitus with diabetic nephropathy, with long-term current use of insulin ([place]) 05/29/2017  . Long term (current) use of insulin ([place]) 05/29/2017  . Cellulitis in diabetic foot ([place]) 02/21/2017  . Cellulitis 02/21/2017  . Upper GI bleed/melena 07/20/2015  . Hematemesis 02/08/2015  . Cirrhosis ([place]) 02/08/2015  . Esophageal varices ([place]) 02/08/2015  . Gastroesophageal reflux disease 02/08/2015  . Hyperlipidemia 02/08/2015  . Chronic venous stasis dermatitis of both lower extremities 10/06/2014  . Cutaneous abscess of limb, unspecified 09/17/2014  . Edema 05/25/2014  . Hypertension 05/25/2014  . Obesity, Class III, BMI 40-49.9 (morbid obesity) ([place]) 05/25/2014  . Sleep apnea 05/25/2014  . Barrett's esophagus 05/25/2014  . Pancytopenia ([place]) 05/25/2014  . History of esophageal varices 05/25/2014  . Iron deficiency anemia 12/04/2013  . Thrombocytopenia ([place]) 12/04/2013  . Splenomegaly 11/04/2013    Past Surgical History:  Procedure Laterality Date  . ESOPHAGOGASTRODUODENOSCOPY N/A 02/09/2015   Procedure: ESOPHAGOGASTRODUODENOSCOPY (EGD);  Surgeon: Manya Silvas, MD;  Location[facility] ENDOSCOPY;  Service: Endoscopy;  Laterality: N/A;  . ESOPHAGOGASTRODUODENOSCOPY N/A 07/22/2015   Procedure: ESOPHAGOGASTRODUODENOSCOPY (EGD);  Surgeon: Manya Silvas, MD;  Location: [facility] ENDOSCOPY;  Service: Endoscopy;  Laterality: N/A;  . ESOPHAGOGASTRODUODENOSCOPY N/A 06/26/2017   Procedure: ESOPHAGOGASTRODUODENOSCOPY (EGD);  Surgeon: Manya Silvas, MD;  Location: [facility] ENDOSCOPY;  Service: Endoscopy;  Laterality: N/A;  . ESOPHAGOGASTRODUODENOSCOPY N/A 06/27/2017   Procedure: ESOPHAGOGASTRODUODENOSCOPY (EGD);   Surgeon: Manya Silvas, MD;  Location: [facility] ENDOSCOPY;  Service: Endoscopy;  Laterality: N/A;  . ESOPHAGOGASTRODUODENOSCOPY N/A 06/28/2017   Procedure: ESOPHAGOGASTRODUODENOSCOPY (EGD);  Surgeon: Manya Silvas, MD;  Location: [facility] ENDOSCOPY;  Service: Endoscopy;  Laterality: N/A;  . ESOPHAGOGASTRODUODENOSCOPY Bilateral 10/11/2017   Procedure: ESOPHAGOGASTRODUODENOSCOPY (EGD);  Surgeon: Manya Silvas, MD;  Location: [facility] ENDOSCOPY;  Service: Endoscopy;  Laterality: Bilateral;  . ESOPHAGOGASTRODUODENOSCOPY N/A 12/04/2017   Procedure: ESOPHAGOGASTRODUODENOSCOPY (EGD);  Surgeon: Manya Silvas, MD;  Location: [facility] ENDOSCOPY;  Service: Endoscopy;  Laterality: N/A;  . ESOPHAGOGASTRODUODENOSCOPY N/A 03/06/2019   Procedure: ESOPHAGOGASTRODUODENOSCOPY (EGD);  Surgeon: Toledo, Benay Pike, MD;  Location: [facility] ENDOSCOPY;  Service: Gastroenterology;  Laterality: N/A;  . ESOPHAGOGASTRODUODENOSCOPY (EGD) WITH PROPOFOL N/A 07/20/2015   Procedure: ESOPHAGOGASTRODUODENOSCOPY (EGD) WITH PROPOFOL;  Surgeon: Manya Silvas, MD;  Location: [facility] ENDOSCOPY;  Service: Endoscopy;  Laterality: N/A;  . ESOPHAGOGASTRODUODENOSCOPY (EGD) WITH PROPOFOL N/A 09/16/2015   Procedure: ESOPHAGOGASTRODUODENOSCOPY (EGD) WITH PROPOFOL;  Surgeon: Manya Silvas, MD;  Location: [facility] ENDOSCOPY;  Service: Endoscopy;  Laterality: N/A;  . ESOPHAGOGASTRODUODENOSCOPY (EGD) WITH PROPOFOL N/A 03/16/2016   Procedure: ESOPHAGOGASTRODUODENOSCOPY (EGD) WITH PROPOFOL;  Surgeon: Manya Silvas, MD;  Location: [facility] ENDOSCOPY;  Service: Endoscopy;  Laterality: N/A;  . ESOPHAGOGASTRODUODENOSCOPY (EGD) WITH PROPOFOL N/A 09/14/2016   Procedure: ESOPHAGOGASTRODUODENOSCOPY (EGD)  WITH PROPOFOL;  Surgeon: Manya Silvas, MD;  Location[facility] ENDOSCOPY;  Service: Endoscopy;  Laterality: N/A;  . ESOPHAGOGASTRODUODENOSCOPY (EGD) WITH PROPOFOL N/A 11/05/2016   Procedure: ESOPHAGOGASTRODUODENOSCOPY (EGD) WITH PROPOFOL;  Surgeon: Manya Silvas, MD;   Location: [facility] ENDOSCOPY;  Service: Endoscopy;  Laterality: N/A;  . ESOPHAGOGASTRODUODENOSCOPY (EGD) WITH PROPOFOL N/A 02/06/2017   Procedure: ESOPHAGOGASTRODUODENOSCOPY (EGD) WITH PROPOFOL;  Surgeon: Manya Silvas, MD;  Location: [facility] ENDOSCOPY;  Service: Endoscopy;  Laterality: N/A;  . ESOPHAGOGASTRODUODENOSCOPY (EGD) WITH PROPOFOL N/A 04/24/2017   Procedure: ESOPHAGOGASTRODUODENOSCOPY (EGD) WITH PROPOFOL;  Surgeon: Manya Silvas, MD;  Location: [facility] ENDOSCOPY;  Service: Endoscopy;  Laterality: N/A;  . ESOPHAGOGASTRODUODENOSCOPY (EGD) WITH PROPOFOL N/A 07/31/2017   Procedure: ESOPHAGOGASTRODUODENOSCOPY (EGD) WITH PROPOFOL;  Surgeon: Manya Silvas, MD;  Location: [facility] ENDOSCOPY;  Service: Endoscopy;  Laterality: N/A;  . ESOPHAGOGASTRODUODENOSCOPY (EGD) WITH PROPOFOL N/A 08/08/2018   Procedure: ESOPHAGOGASTRODUODENOSCOPY (EGD) WITH PROPOFOL;  Surgeon: Manya Silvas, MD;  Location: [facility] ENDOSCOPY;  Service: Endoscopy;  Laterality: N/A;  . ESOPHAGOGASTRODUODENOSCOPY (EGD) WITH PROPOFOL N/A 05/21/2020   Procedure: ESOPHAGOGASTRODUODENOSCOPY (EGD) WITH PROPOFOL;  Surgeon: [name] Manifold, MD;  Location: [facility] ENDOSCOPY;  Service: Endoscopy;  Laterality: N/A;  . GIVENS CAPSULE STUDY N/A 12/05/2017   Procedure: GIVENS CAPSULE STUDY;  Surgeon: Manya Silvas, MD;  Location: [facility] ENDOSCOPY;  Service: Endoscopy;  Laterality: N/A;  . TONSILLECTOMY    . TONSILLECTOMY AND ADENOIDECTOMY    . ULNAR NERVE TRANSPOSITION    . UVULOPALATOPHARYNGOPLASTY      Prior to Admission medications   Medication Sig Start Date End Date Taking? Authorizing Provider  atorvastatin (LIPITOR) 20 MG tablet Take 20 mg by mouth every evening.  03/23/20   [provider]  ferrous sulfate 325 (65 FE) MG tablet Take 1 tablet (325 mg total) by mouth daily. This medication can cause black stools 06/11/20   Wyvonnia Dusky, MD  furosemide (LASIX) 40 MG tablet Take 40-160 mg by mouth daily.    [provider]  hydrALAZINE (APRESOLINE) 50 MG tablet Take 1 tablet (50 mg total) by mouth 2 (two) times daily. 05/03/20   Ezekiel Slocumb, DO  Ipratropium-Albuterol (COMBIVENT RESPIMAT) 20-100 MCG/ACT AERS respimat Inhale 1 puff into the lungs every 6 (six) hours. Patient taking differently: Inhale 1 puff into the lungs every 6 (six) hours as needed.  02/12/20   Loletha Grayer, MD  lactulose (CHRONULAC) 10 GM/15ML solution Take 90 mLs (60 g total) by mouth daily. Patient taking differently: Take 45 g by mouth 2 (two) times daily.  05/04/20   Ezekiel Slocumb, DO  LORazepam (ATIVAN) 0.5 MG tablet Take 1 tablet (0.5 mg total) by mouth 3 (three) times daily as needed for Anxiety    [provider]  metolazone (ZAROXOLYN) 5 MG tablet Take 5 mg by mouth daily. . 05/13/20   [provider]  NOVOLOG MIX 70/30 FLEXPEN (70-30) 100 UNIT/ML FlexPen 0-84 Units as directed. Inject 0 - 84 Units (sliding scale) Subcutaneously twice a day before meals, at breakfast and at dinner. 03/17/20   [provider]  pantoprazole (PROTONIX) 40 MG tablet Take 1 tablet (40 mg total) by mouth 2 (two) times daily. 03/06/19 06/07/20  Dustin Flock, MD  Potassium Chloride ER 20 MEQ TBCR Take 20 mEq by mouth daily.  04/21/20   [provider]  rifaximin (XIFAXAN) 550 MG TABS tablet Take 1 tablet (550 mg total) by mouth 2 (two) times daily. 02/12/20   Loletha Grayer, MD  sodium  chloride (OCEAN) 0.65 % SOLN nasal spray Place 1 spray into both nostrils as needed for congestion (nose irritation). 02/12/20   Loletha Grayer, MD  tamsulosin (FLOMAX) 0.4 MG CAPS capsule Take 1 capsule (0.4 mg total) by mouth daily after supper. 05/03/20   Ezekiel Slocumb, DO  torsemide (DEMADEX) 100 MG tablet Take 1 tablet (100 mg total) by mouth daily. 05/04/20   Ezekiel Slocumb, DO  traMADol (ULTRAM) 50 MG tablet Take 50 mg by mouth every 6 (six) hours as needed.     [provider]    Allergies Patient has no  known allergies.  Family History  Problem Relation Age of Onset  . Diabetes Other   . Transient ischemic attack Father   . CAD Father     Social History Social History   Tobacco Use  . Smoking status: Former Smoker    Years: 20.00    Types: Cigars, Cigarettes  . Smokeless tobacco: Never Used  Vaping Use  . Vaping Use: Never used  Substance Use Topics  . Alcohol use: No    Comment: stopped 15 years ago  . Drug use: No    Review of Systems  Constitutional: No fever/chills Eyes: No visual changes. ENT: No sore throat. Cardiovascular: Denies chest pain. Respiratory: Denies shortness of breath. Gastrointestinal: No abdominal pain.  No nausea, no vomiting.  No diarrhea.  No constipation. Genitourinary: Negative for dysuria. Musculoskeletal: Negative for back pain. Skin: Negative for rash. Neurological: Positive for altered mentation.  Negative for headaches, focal weakness or numbness.   ____________________________________________   PHYSICAL EXAM:  VITAL SIGNS: ED Triage Vitals [07/07/20 0537]  Enc Vitals Group     BP      Pulse      Resp      Temp      Temp src      SpO2      Weight      Height      Head Circumference      Peak Flow      Pain Score 0     Pain Loc      Pain Edu?      Excl. in [place]?     Constitutional: Alert and oriented. Ill appearing and in mild acute distress. Eyes: Conjunctivae are normal. PERRL. EOMI. Head: Atraumatic. Nose: No congestion/rhinnorhea. Mouth/Throat: Mucous membranes are mildly dry.   Neck: No stridor.   Cardiovascular: Normal rate, regular rhythm. Grossly normal heart sounds.  Good peripheral circulation. Respiratory: Normal respiratory effort.  No retractions. Lungs CTAB. Gastrointestinal: Obese.  Soft and nontender to light or deep palpation. No distention. No abdominal bruits. No CVA tenderness. Musculoskeletal: No lower extremity tenderness.  3+ BLE nonpitting edema.  BLE venous stasis.  No joint  effusions. Neurologic: Eyes open spontaneously.  No response verbally.  Unable to follow simple commands.   Skin:  Skin is warm, diaphoretic and intact. No rash noted. Psychiatric: Unable to assess. ____________________________________________   LABS (all labs ordered are listed, but only abnormal results are displayed)  Labs Reviewed  CBC WITH DIFFERENTIAL/PLATELET - Abnormal; Notable for the following components:      Result Value   RBC 2.54 (*)    Hemoglobin 8.0 (*)    HCT 22.3 (*)    Platelets 69 (*)    Lymphs Abs 0.3 (*)    All other components within normal limits  AMMONIA - Abnormal; Notable for the following components:   Ammonia 189 (*)    All other components  within normal limits  CULTURE, BLOOD (ROUTINE X 2)  CULTURE, BLOOD (ROUTINE X 2)  URINE CULTURE  RESPIRATORY PANEL BY RT PCR (FLU A&B, COVID)  GLUCOSE, CAPILLARY  LACTIC ACID, PLASMA  LACTIC ACID, PLASMA  COMPREHENSIVE METABOLIC PANEL  BRAIN NATRIURETIC PEPTIDE  URINALYSIS, COMPLETE (UACMP) WITH MICROSCOPIC  TSH  T4, FREE  TROPONIN I (HIGH SENSITIVITY)   ____________________________________________  EKG  ED ECG REPORT I, Dearra Myhand J, the attending physician, personally viewed and interpreted this ECG.   Date: 07/07/2020  EKG Time: 0548  Rate: 82  Rhythm: atrial fibrillation, rate 82  Axis: Normal  Intervals:none  ST&T Change: Nonspecific  ____________________________________________  RADIOLOGY I, Jianni Batten J, personally viewed and evaluated these images (plain radiographs) as part of my medical decision making, as well as reviewing the written report by the radiologist.  ED MD interpretation: Cardiomegaly, pulmonary venous congestion; no ICH  Official radiology report(s): CT Head Wo Contrast  Result Date: 07/07/2020 CLINICAL DATA:  Mental status changes. EXAM: CT HEAD WITHOUT CONTRAST TECHNIQUE: Contiguous axial images were obtained from the base of the skull through the vertex without  intravenous contrast. COMPARISON:  06/16/2020 FINDINGS: Brain: There is no evidence for acute hemorrhage, hydrocephalus, mass lesion, or abnormal extra-axial fluid collection. No definite CT evidence for acute infarction. Diffuse loss of parenchymal volume is consistent with atrophy. Vascular: No hyperdense vessel or unexpected calcification. Skull: No evidence for fracture. No worrisome lytic or sclerotic lesion. Sinuses/Orbits: The visualized paranasal sinuses and mastoid air cells are clear. Visualized portions of the globes and intraorbital fat are unremarkable. Other: None. IMPRESSION: 1. Stable.  No acute intracranial abnormality. 2. Atrophy. Electronically Signed   By: Misty Stanley M.D.   On: 07/07/2020 06:27   DG Chest Port 1 View  Result Date: 07/07/2020 CLINICAL DATA:  Altered mental status. EXAM: PORTABLE CHEST 1 VIEW COMPARISON:  06/06/2020. FINDINGS: Mediastinum hilar structures normal. Cardiomegaly with pulmonary venous congestion again noted. Mild bilateral interstitial prominence again noted without interim change. Low lung volumes with bibasilar atelectasis. No prominent pleural effusion. No pneumothorax. IMPRESSION: Cardiomegaly with pulmonary venous congestion again noted. Mild bilateral interstitial prominence again noted without interim change. Low lung volumes with bibasilar atelectasis. Similar findings noted on prior chest x-ray of 06/06/2020. Electronically Signed   By: Marcello Moores  Register   On: 07/07/2020 06:08    ____________________________________________   PROCEDURES  Procedure(s) performed (including Critical Care):  .1-3 Lead EKG Interpretation Performed by: Paulette Blanch, MD Authorized by: Paulette Blanch, MD      CRITICAL CARE Performed by: Paulette Blanch   Total critical care time: 45 minutes  Critical care time was exclusive of separately billable procedures and treating other patients.  Critical care was necessary to treat or prevent imminent or  life-threatening deterioration.  Critical care was time spent personally by me on the following activities: development of treatment plan with patient and/or surrogate as well as nursing, discussions with consultants, evaluation of patient's response to treatment, examination of patient, obtaining history from patient or surrogate, ordering and performing treatments and interventions, ordering and review of laboratory studies, ordering and review of radiographic studies, pulse oximetry and re-evaluation of patient's condition.  ____________________________________________   INITIAL IMPRESSION / ASSESSMENT AND PLAN / ED COURSE  As part of my medical decision making, I reviewed the following data within the [place] notes reviewed and incorporated, Labs reviewed, EKG interpreted, Old chart reviewed, Radiograph reviewed and Notes from prior ED visits     69 year old male  presenting with altered mental status. Differential diagnosis includes, but is not limited to, alcohol, illicit or prescription medications, or other toxic ingestion; intracranial pathology such as stroke or intracerebral hemorrhage; fever or infectious causes including sepsis; hypoxemia and/or hypercarbia; uremia; trauma; endocrine related disorders such as diabetes, hypoglycemia, and thyroid-related diseases; hypertensive encephalopathy; etc.  Will obtain sepsis work-up, ammonia, CT head, chest x-ray.  Anticipate hospitalization.   Clinical Course as of Jul 07 656  Thu Jul 07, 2020  0605 Rectal temperature 94.4 F.  Bair hugger ordered.   [JS]  0940 HWKG 88.   [JS]  [id number] H/H stable from 3 weeks ago.   [JS]  [id number] Noted elevated ammonia; have ordered Lactulose. Care transferred to Dr. Tamala Julian pending labs and admission.   [JS]    Clinical Course User Index [JS] Paulette Blanch, MD     ____________________________________________   FINAL CLINICAL IMPRESSION(S) / ED DIAGNOSES  Final diagnoses:   Altered mental status, unspecified altered mental status type  Hypoglycemia  Hypothermia, initial encounter  Anemia, unspecified type  Hyperammonemia [facility])     ED Discharge Orders    None      *Please note:  Corey West was evaluated in Emergency Department on 07/07/2020 for the symptoms described in the history of present illness. He was evaluated in the context of the global COVID-19 pandemic, which necessitated consideration that the patient might be at risk for infection with the SARS-CoV-2 virus that causes COVID-19. Institutional protocols and algorithms that pertain to the evaluation of patients at risk for COVID-19 are in a state of rapid change based on information released by regulatory bodies including the CDC and federal and state organizations. These policies and algorithms were followed during the patient's care in the ED.  Some ED evaluations and interventions may be delayed as a result of limited staffing during and the pandemic.*   Note:  This document was prepared using Dragon voice recognition software and may include unintentional dictation errors.   Paulette Blanch, MD 07/07/20 [phone]

## 2020-07-07 NOTE — ED Triage Notes (Signed)
Pt to ED via ems frmo home. Pt arrives altered from baseline, pt alert to voice but wont respond verbally. Pts cbg on ems arrival to home was 60s oral glucose given cbg 80s. [facility].

## 2020-07-07 NOTE — ED Notes (Signed)
Pt was rotated, cleaned and foam pad was placed on coccyx. Report taken from [place]

## 2020-07-07 NOTE — H&P (Addendum)
History and Physical    Corey West YIR:[id number] DOB: 10/09/1950 DOA: 07/07/2020  Referring MD/NP/PA:   PCP: Leonel Ramsay, MD   Patient coming from:  The patient is coming from home.  At baseline, pt is partially dependent for most of ADL.        Chief Complaint: AMS  HPI: Corey West is a 69 y.o. male with medical history significant of hypertension, hyperlipidemia, diabetes mellitus, COPD, GERD, depression, anxiety, venous insufficiency change, OSA not on CPAP, pancytopenia, liver cirrhosis, esophageal varices, CHF, AVM of the stomach, GAVE, upper GI bleeding, iron deficiency anemia, pancytopenia, thrombocytopenia, C. difficile colitis, hepatic encephalopathy, CKD stage IV, who presents with altered mental status.   Patient has AMS and is unable to provide accurate medical history. I called his niece by phone, but his niece also dose not know the detailed information about what happened to patient. Most of the history is obtained by discussing the case with ED physician, per EMS report, and with the nursing staff.  Per report, patient was recently in rehab. He was found to be more confused by family.  Patient arrives to the ED with eyes open spontaneously. He is alert to voice, but wont respond verbally.  No active nausea, vomiting, diarrhea noted.  No active respiratory distress, shortness of breath noted.  Does not seem to have pain anywhere.  Not sure if patient has symptoms of UTI.  Patient moves all extremities on painful stimuli.  No facial droop. Pt was found to have hypoglycemia with blood sugar in 60s, which improved to 80s after giving oral glucose by EMS.   ED Course: pt was found to have ammonia 189, alcohol level less than 10, negative Covid PCR, renal function close to baseline, BNP 243, lactic acid 1.7, potassium 2.7, magnesium 2.0, hypothermia with body temperature 94.4, oxygen saturation 100% on room air, tachypnea with RR 21, heart rate 88, blood pressure 182/60.  CT  head negative for acute intracranial abnormalities.  Chest x-ray showed cardiomegaly and vascular congestion with low volume.  Patient is admitted to [place] bed as inpatient.   Review of Systems: Could not be reviewed due to altered mental status.  Allergy: No Known Allergies  Past Medical History:  Diagnosis Date  . Anemia   . Anxiety    controlled;   . Arthritis   . AVM (arteriovenous malformation) of stomach, acquired with hemorrhage   . Barrett's esophagus   . CHF (congestive heart failure) ([place])   . Chronic kidney disease    renal infufficiency  . Cirrhosis ([place])   . COPD (chronic obstructive pulmonary disease) ([place])   . Depression    controlled;   Marland Kitchen Diabetes mellitus without complication ([place])    not controlled, taking insulin but sugar continues to run high;   Marland Kitchen Dysrhythmia   . Edema   . Esophageal varices ([place])   . GAVE (gastric antral vascular ectasia)   . GERD (gastroesophageal reflux disease)   . History of hiatal hernia   . Hyperlipidemia   . Hypertension    controlled well;   . Liver cirrhosis ([place])   . Nephropathy, diabetic ([place])   . Obesity   . Pancytopenia ([place])   . Polyp, stomach    with chronic blood loss  . Sleep apnea    does not wear a cpap, Medicare would not pay for it   . Venous stasis dermatitis of both lower extremities   . Venous stasis of both lower extremities  with cellulitis    Past Surgical History:  Procedure Laterality Date  . ESOPHAGOGASTRODUODENOSCOPY N/A 02/09/2015   Procedure: ESOPHAGOGASTRODUODENOSCOPY (EGD);  Surgeon: Manya Silvas, MD;  Location: [facility] ENDOSCOPY;  Service: Endoscopy;  Laterality: N/A;  . ESOPHAGOGASTRODUODENOSCOPY N/A 07/22/2015   Procedure: ESOPHAGOGASTRODUODENOSCOPY (EGD);  Surgeon: Manya Silvas, MD;  Location: [facility] ENDOSCOPY;  Service: Endoscopy;  Laterality: N/A;  . ESOPHAGOGASTRODUODENOSCOPY N/A 06/26/2017   Procedure: ESOPHAGOGASTRODUODENOSCOPY (EGD);  Surgeon: Manya Silvas, MD;  Location:  [facility] ENDOSCOPY;  Service: Endoscopy;  Laterality: N/A;  . ESOPHAGOGASTRODUODENOSCOPY N/A 06/27/2017   Procedure: ESOPHAGOGASTRODUODENOSCOPY (EGD);  Surgeon: Manya Silvas, MD;  Location: [facility] ENDOSCOPY;  Service: Endoscopy;  Laterality: N/A;  . ESOPHAGOGASTRODUODENOSCOPY N/A 06/28/2017   Procedure: ESOPHAGOGASTRODUODENOSCOPY (EGD);  Surgeon: Manya Silvas, MD;  Location: [facility] ENDOSCOPY;  Service: Endoscopy;  Laterality: N/A;  . ESOPHAGOGASTRODUODENOSCOPY Bilateral 10/11/2017   Procedure: ESOPHAGOGASTRODUODENOSCOPY (EGD);  Surgeon: Manya Silvas, MD;  Location: [facility] ENDOSCOPY;  Service: Endoscopy;  Laterality: Bilateral;  . ESOPHAGOGASTRODUODENOSCOPY N/A 12/04/2017   Procedure: ESOPHAGOGASTRODUODENOSCOPY (EGD);  Surgeon: Manya Silvas, MD;  Location: [facility] ENDOSCOPY;  Service: Endoscopy;  Laterality: N/A;  . ESOPHAGOGASTRODUODENOSCOPY N/A 03/06/2019   Procedure: ESOPHAGOGASTRODUODENOSCOPY (EGD);  Surgeon: Toledo, Benay Pike, MD;  Location: [facility] ENDOSCOPY;  Service: Gastroenterology;  Laterality: N/A;  . ESOPHAGOGASTRODUODENOSCOPY (EGD) WITH PROPOFOL N/A 07/20/2015   Procedure: ESOPHAGOGASTRODUODENOSCOPY (EGD) WITH PROPOFOL;  Surgeon: Manya Silvas, MD;  Location: [facility] ENDOSCOPY;  Service: Endoscopy;  Laterality: N/A;  . ESOPHAGOGASTRODUODENOSCOPY (EGD) WITH PROPOFOL N/A 09/16/2015   Procedure: ESOPHAGOGASTRODUODENOSCOPY (EGD) WITH PROPOFOL;  Surgeon: Manya Silvas, MD;  Location: [facility] ENDOSCOPY;  Service: Endoscopy;  Laterality: N/A;  . ESOPHAGOGASTRODUODENOSCOPY (EGD) WITH PROPOFOL N/A 03/16/2016   Procedure: ESOPHAGOGASTRODUODENOSCOPY (EGD) WITH PROPOFOL;  Surgeon: Manya Silvas, MD;  Location: [facility] ENDOSCOPY;  Service: Endoscopy;  Laterality: N/A;  . ESOPHAGOGASTRODUODENOSCOPY (EGD) WITH PROPOFOL N/A 09/14/2016   Procedure: ESOPHAGOGASTRODUODENOSCOPY (EGD) WITH PROPOFOL;  Surgeon: Manya Silvas, MD;  Location: [facility] ENDOSCOPY;  Service: Endoscopy;  Laterality: N/A;  .  ESOPHAGOGASTRODUODENOSCOPY (EGD) WITH PROPOFOL N/A 11/05/2016   Procedure: ESOPHAGOGASTRODUODENOSCOPY (EGD) WITH PROPOFOL;  Surgeon: Manya Silvas, MD;  Location: [facility] ENDOSCOPY;  Service: Endoscopy;  Laterality: N/A;  . ESOPHAGOGASTRODUODENOSCOPY (EGD) WITH PROPOFOL N/A 02/06/2017   Procedure: ESOPHAGOGASTRODUODENOSCOPY (EGD) WITH PROPOFOL;  Surgeon: Manya Silvas, MD;  Location: [facility] ENDOSCOPY;  Service: Endoscopy;  Laterality: N/A;  . ESOPHAGOGASTRODUODENOSCOPY (EGD) WITH PROPOFOL N/A 04/24/2017   Procedure: ESOPHAGOGASTRODUODENOSCOPY (EGD) WITH PROPOFOL;  Surgeon: Manya Silvas, MD;  Location: [facility] ENDOSCOPY;  Service: Endoscopy;  Laterality: N/A;  . ESOPHAGOGASTRODUODENOSCOPY (EGD) WITH PROPOFOL N/A 07/31/2017   Procedure: ESOPHAGOGASTRODUODENOSCOPY (EGD) WITH PROPOFOL;  Surgeon: Manya Silvas, MD;  Location: [facility] ENDOSCOPY;  Service: Endoscopy;  Laterality: N/A;  . ESOPHAGOGASTRODUODENOSCOPY (EGD) WITH PROPOFOL N/A 08/08/2018   Procedure: ESOPHAGOGASTRODUODENOSCOPY (EGD) WITH PROPOFOL;  Surgeon: Manya Silvas, MD;  Location: [facility] ENDOSCOPY;  Service: Endoscopy;  Laterality: N/A;  . ESOPHAGOGASTRODUODENOSCOPY (EGD) WITH PROPOFOL N/A 05/21/2020   Procedure: ESOPHAGOGASTRODUODENOSCOPY (EGD) WITH PROPOFOL;  Surgeon: [name] Manifold, MD;  Location: [facility] ENDOSCOPY;  Service: Endoscopy;  Laterality: N/A;  . GIVENS CAPSULE STUDY N/A 12/05/2017   Procedure: GIVENS CAPSULE STUDY;  Surgeon: Manya Silvas, MD;  Location: [facility] ENDOSCOPY;  Service: Endoscopy;  Laterality: N/A;  . TONSILLECTOMY    . TONSILLECTOMY AND ADENOIDECTOMY    . ULNAR NERVE TRANSPOSITION    . UVULOPALATOPHARYNGOPLASTY      Social History:  reports that he has quit smoking. His smoking use  included cigars and cigarettes. He quit after 20.00 years of use. He has never used smokeless tobacco. He reports that he does not drink alcohol and does not use drugs.  Family History:  Family History  Problem Relation Age  of Onset  . Diabetes Other   . Transient ischemic attack Father   . CAD Father      Prior to Admission medications   Medication Sig Start Date End Date Taking? Authorizing Provider  atorvastatin (LIPITOR) 20 MG tablet Take 20 mg by mouth every evening.  03/23/20   [provider]  ferrous sulfate 325 (65 FE) MG tablet Take 1 tablet (325 mg total) by mouth daily. This medication can cause black stools 06/11/20   Wyvonnia Dusky, MD  furosemide (LASIX) 40 MG tablet Take 40-160 mg by mouth daily.    [provider]  hydrALAZINE (APRESOLINE) 50 MG tablet Take 1 tablet (50 mg total) by mouth 2 (two) times daily. 05/03/20   Ezekiel Slocumb, DO  Ipratropium-Albuterol (COMBIVENT RESPIMAT) 20-100 MCG/ACT AERS respimat Inhale 1 puff into the lungs every 6 (six) hours. Patient taking differently: Inhale 1 puff into the lungs every 6 (six) hours as needed.  02/12/20   Loletha Grayer, MD  lactulose (CHRONULAC) 10 GM/15ML solution Take 90 mLs (60 g total) by mouth daily. Patient taking differently: Take 45 g by mouth 2 (two) times daily.  05/04/20   Ezekiel Slocumb, DO  LORazepam (ATIVAN) 0.5 MG tablet Take 1 tablet (0.5 mg total) by mouth 3 (three) times daily as needed for Anxiety    [provider]  metolazone (ZAROXOLYN) 5 MG tablet Take 5 mg by mouth daily. . 05/13/20   [provider]  NOVOLOG MIX 70/30 FLEXPEN (70-30) 100 UNIT/ML FlexPen 0-84 Units as directed. Inject 0 - 84 Units (sliding scale) Subcutaneously twice a day before meals, at breakfast and at dinner. 03/17/20   [provider]  pantoprazole (PROTONIX) 40 MG tablet Take 1 tablet (40 mg total) by mouth 2 (two) times daily. 03/06/19 06/07/20  Dustin Flock, MD  Potassium Chloride ER 20 MEQ TBCR Take 20 mEq by mouth daily.  04/21/20   [provider]  rifaximin (XIFAXAN) 550 MG TABS tablet Take 1 tablet (550 mg total) by mouth 2 (two) times daily. 02/12/20   Loletha Grayer, MD  sodium  chloride (OCEAN) 0.65 % SOLN nasal spray Place 1 spray into both nostrils as needed for congestion (nose irritation). 02/12/20   Loletha Grayer, MD  tamsulosin (FLOMAX) 0.4 MG CAPS capsule Take 1 capsule (0.4 mg total) by mouth daily after supper. 05/03/20   Ezekiel Slocumb, DO  torsemide (DEMADEX) 100 MG tablet Take 1 tablet (100 mg total) by mouth daily. 05/04/20   Ezekiel Slocumb, DO  traMADol (ULTRAM) 50 MG tablet Take 50 mg by mouth every 6 (six) hours as needed.     [provider]    Physical Exam: Vitals:   07/07/20 0552 07/07/20 0730 07/07/20 0854  BP: (!) 152/60 (!) 133/52 118/84  Pulse: 88 62 88  Resp: (!) 21 14   Temp: (!) 94.4 F (34.7 C)  (!) 97.5 F (36.4 C)  TempSrc: Rectal  Axillary  SpO2: 100% 100% 100%   General: Not in acute distress HEENT:       Eyes: PERRL, EOMI, no scleral icterus.       ENT: No discharge from the ears and nose       Neck: No JVD, no bruit, no  mass felt. Heme: No neck lymph node enlargement. Cardiac: S1/S2, RRR, No murmurs, No gallops or rubs. Respiratory: No rales, wheezing, rhonchi or rubs. GI: Soft, nondistended, nontender, no organomegaly, BS present. GU: No hematuria Ext: Has chronic venous insufficiency change in both legs, has trace leg edema bilaterally. 1+DP/PT pulse bilaterally. Musculoskeletal: No joint deformities, No joint redness or warmth, no limitation of ROM in spin. Skin: No rashes.  Neuro: Patient has altered, barely arousable, not following command, eyes open spontaneously, not responds verbally.  moves all extremities on painful stimuli.  Psych: Patient is not psychotic, no suicidal or hemocidal ideation.  Labs on Admission: I have personally reviewed following labs and imaging studies  CBC: Recent Labs  Lab 07/07/20 0603  WBC 4.5  NEUTROABS 3.7  HGB 8.0*  HCT 22.3*  MCV 87.8  PLT 69*   Basic Metabolic Panel: Recent Labs  Lab 07/07/20 0603  NA 139  K 2.7*  CL 100  CO2 27  GLUCOSE 92  BUN  77*  CREATININE 2.90*  CALCIUM 8.9  MG 2.0   GFR: CrCl cannot be calculated (Unknown ideal weight.). Liver Function Tests: Recent Labs  Lab 07/07/20 0603  AST 40  ALT 30  ALKPHOS 78  BILITOT 0.9  PROT 6.5  ALBUMIN 2.9*   No results for input(s): LIPASE, AMYLASE in the last 168 hours. Recent Labs  Lab 07/06/20 1126 07/07/20 0603  AMMONIA 102* 189*   Coagulation Profile: No results for input(s): INR, PROTIME in the last 168 hours. Cardiac Enzymes: No results for input(s): CKTOTAL, CKMB, CKMBINDEX, TROPONINI in the last 168 hours. BNP (last 3 results) No results for input(s): PROBNP in the last 8760 hours. HbA1C: No results for input(s): HGBA1C in the last 72 hours. CBG: Recent Labs  Lab 07/07/20 0541  GLUCAP 96   Lipid Profile: No results for input(s): CHOL, HDL, LDLCALC, TRIG, CHOLHDL, LDLDIRECT in the last 72 hours. Thyroid Function Tests: Recent Labs    07/07/20 0603  TSH 2.116  FREET4 1.11   Anemia Panel: No results for input(s): VITAMINB12, FOLATE, FERRITIN, TIBC, IRON, RETICCTPCT in the last 72 hours. Urine analysis:    Component Value Date/Time   COLORURINE YELLOW (A) 06/17/2020 0519   APPEARANCEUR CLEAR (A) 06/17/2020 0519   APPEARANCEUR Clear 09/10/2014 0456   LABSPEC 1.012 06/17/2020 0519   LABSPEC 1.016 09/10/2014 0456   PHURINE 6.0 06/17/2020 0519   GLUCOSEU 50 (A) 06/17/2020 0519   GLUCOSEU Negative 09/10/2014 0456   HGBUR SMALL (A) 06/17/2020 0519   BILIRUBINUR NEGATIVE 06/17/2020 0519   BILIRUBINUR Negative 09/10/2014 [place] 06/17/2020 0519   PROTEINUR 100 (A) 06/17/2020 0519   NITRITE NEGATIVE 06/17/2020 0519   LEUKOCYTESUR NEGATIVE 06/17/2020 0519   LEUKOCYTESUR Negative 09/10/2014 0456   Sepsis Labs: @LABRCNTIP (procalcitonin:4,lacticidven:4) ) Recent Results (from the past 240 hour(s))  Respiratory Panel by RT PCR (Flu A&B, Covid) - Nasopharyngeal Swab     Status: None   Collection Time: 07/07/20  6:03 AM    Specimen: Nasopharyngeal Swab  Result Value Ref Range Status   SARS Coronavirus 2 by RT PCR NEGATIVE NEGATIVE Final    Comment: (NOTE) SARS-CoV-2 target nucleic acids are NOT DETECTED.  The SARS-CoV-2 RNA is generally detectable in upper respiratoy specimens during the acute phase of infection. The lowest concentration of SARS-CoV-2 viral copies this assay can detect is 131 copies/mL. A negative result does not preclude SARS-Cov-2 infection and should not be used as the sole basis for treatment or other patient management  decisions. A negative result may occur with  improper specimen collection/handling, submission of specimen other than nasopharyngeal swab, presence of viral mutation(s) within the areas targeted by this assay, and inadequate number of viral copies (<131 copies/mL). A negative result must be combined with clinical observations, patient history, and epidemiological information. The expected result is Negative.  Fact Sheet for Patients:  PinkCheek.be  Fact Sheet for Healthcare Providers:  GravelBags.it  This test is no t yet approved or cleared by the [place] FDA and  has been authorized for detection and/or diagnosis of SARS-CoV-2 by FDA under an Emergency Use Authorization (EUA). This EUA will remain  in effect (meaning this test can be used) for the duration of the COVID-19 declaration under Section 564(b)(1) of the Act, 21 U.S.C. section 360bbb-3(b)(1), unless the authorization is terminated or revoked sooner.     Influenza A by PCR NEGATIVE NEGATIVE Final   Influenza B by PCR NEGATIVE NEGATIVE Final    Comment: (NOTE) The Xpert Xpress SARS-CoV-2/FLU/RSV assay is intended as an aid in  the diagnosis of influenza from Nasopharyngeal swab specimens and  should not be used as a sole basis for treatment. Nasal washings and  aspirates are unacceptable for Xpert Xpress SARS-CoV-2/FLU/RSV   testing.  Fact Sheet for Patients: PinkCheek.be  Fact Sheet for Healthcare Providers: GravelBags.it  This test is not yet approved or cleared by the [place] FDA and  has been authorized for detection and/or diagnosis of SARS-CoV-2 by  FDA under an Emergency Use Authorization (EUA). This EUA will remain  in effect (meaning this test can be used) for the duration of the  Covid-19 declaration under Section 564(b)(1) of the Act, 21  U.S.C. section 360bbb-3(b)(1), unless the authorization is  terminated or revoked. Performed at [facility], [address] [place], [place] [address]      Radiological Exams on Admission: CT Head Wo Contrast  Result Date: 07/07/2020 CLINICAL DATA:  Mental status changes. EXAM: CT HEAD WITHOUT CONTRAST TECHNIQUE: Contiguous axial images were obtained from the base of the skull through the vertex without intravenous contrast. COMPARISON:  06/16/2020 FINDINGS: Brain: There is no evidence for acute hemorrhage, hydrocephalus, mass lesion, or abnormal extra-axial fluid collection. No definite CT evidence for acute infarction. Diffuse loss of parenchymal volume is consistent with atrophy. Vascular: No hyperdense vessel or unexpected calcification. Skull: No evidence for fracture. No worrisome lytic or sclerotic lesion. Sinuses/Orbits: The visualized paranasal sinuses and mastoid air cells are clear. Visualized portions of the globes and intraorbital fat are unremarkable. Other: None. IMPRESSION: 1. Stable.  No acute intracranial abnormality. 2. Atrophy. Electronically Signed   By: Misty Stanley M.D.   On: 07/07/2020 06:27   DG Chest Port 1 View  Result Date: 07/07/2020 CLINICAL DATA:  Altered mental status. EXAM: PORTABLE CHEST 1 VIEW COMPARISON:  06/06/2020. FINDINGS: Mediastinum hilar structures normal. Cardiomegaly with pulmonary venous congestion again noted. Mild bilateral interstitial  prominence again noted without interim change. Low lung volumes with bibasilar atelectasis. No prominent pleural effusion. No pneumothorax. IMPRESSION: Cardiomegaly with pulmonary venous congestion again noted. Mild bilateral interstitial prominence again noted without interim change. Low lung volumes with bibasilar atelectasis. Similar findings noted on prior chest x-ray of 06/06/2020. Electronically Signed   By: Marcello Moores  Register   On: 07/07/2020 06:08     EKG: Independently reviewed.  Sinus rhythm, QTC 420, poor quality of EKG strips, and stable baseline.  Assessment/Plan Principal Problem:   Acute hepatic encephalopathy Active Problems:   Cirrhosis ([place])  Gastroesophageal reflux disease   Hyperlipidemia   Iron deficiency anemia   Thrombocytopenia (HCC)   Hypokalemia   Hypertension   Chronic diastolic CHF (congestive heart failure) (HCC)   Type II diabetes mellitus with renal manifestations (HCC)   Anxiety   CKD (chronic kidney disease) stage 4, GFR 15-29 ml/min (HCC)   COPD with chronic bronchitis (HCC)   Hypoglycemia   SIRS (systemic inflammatory response syndrome) (HCC)   Acute hepatic encephalopathy: Patient has altered mental status and elevated ammonia 189.  CT head is negative for acute intracranial abnormalities.  Clinically consistent with hepatic encephalopathy.  -will admit to med-surg bed as inpt -start lactulose bid per rectum -Start IV Rocephin empirically -Frequent neuro check -Keep patient n.p.o. until mental status improves -Hold all oral medications until mental status improves  SIRS (systemic inflammatory response syndrome) [facility]): Patient meets criteria for SIRS with tachycardia with RR 21 and hypothermia with body temperature 94.4.  No leukocytosis.  No source of infection identified.  Lactic acid is normal 1.7.  Currently hemodynamically stable -Started IV Rocephin empirically -Follow-up of blood culture, urinalysis -Check procalcitonin level -Will not  give more IV fluid due to elevated BNP and history of dCHF.  His lactic acid is normal.  Hypoglycemia: blood sugar 69 -->80.  Likely due to decreased oral intake and continuation of insulin. -Hold 70/30 insulin -Check CBG every hour for 3 times --> then q2h -As needed D50  Hx of cirrhosis (HCC) -check INR/PTT -treat hepatic encephalopathy as above  Gastroesophageal reflux disease -IV Protonix  Hyperlipidemia -Hold Lipitor until mental status improves  Iron deficiency anemia: Hemoglobin stable 8.0 (8.3 on 06/16/2020) -Hold oral iron supplement  Thrombocytopenia ([place]): Most likely due to liver cirrhosis.  Platelets 69, no active bleeding -Follow-up with CBC  Hypokalemia: Potassium 2.7, magnesium 2.0 -10 mEq of potassium chloride x6 IV  Hypertension -As needed hydralazine -Hold oral medications until mental status improves  Chronic diastolic CHF (congestive heart failure) [facility]): Patient has trace leg edema, and chronic venous insufficient change in both legs.  No respiratory distress.  Oxygen saturation 100% on room air.  2D echo on 04/24/2020 showed EF of 50-55% with grade 2 diastolic dysfunction.  Patient does not seem to have CHF exacerbation -Hold oral diuretics  Type II diabetes mellitus with renal manifestations [facility]): Recent A1c 5.7, well controlled.  Patient is taking 70/30 insulin, now has hypoglycemia. -Hold 70/30 insulin due to hypoglycemia  Anxiety -Hold oral Ativan until mental status improves  CKD (chronic kidney disease) stage 4, GFR 15-29 ml/min ([place]): Creatinine close to baseline.  Baseline creatinine 2.6-2.8 recently.  His creatinine is at 2.90, BUN 27. -Follow-up with BMP   COPD with chronic bronchitis ([place]): No wheezing on auscultation -prn albuterol        DVT ppx: SCD Code Status: Partial code. Per his niece, pttient wants to be partial code, OK for CPR, but no intubation. Family Communication:  Disposition Plan: to be determined Consults  called:  none Admission status: Med-surg bed as inpt    Status is: Inpatient  Remains inpatient appropriate because:Inpatient level of care appropriate due to severity of illness   Dispo: The patient is from: Home              Anticipated d/c is to: to be determined              Anticipated d/c date is: 2 days              Patient currently is not  medically stable to d/c.          Date of Service 07/07/2020    Ivor Costa [facility]   If 7PM-7AM, please contact night-coverage [email or web address] 07/07/2020, 9:22 AM

## 2020-07-07 NOTE — Consult Note (Signed)
CODE SEPSIS - PHARMACY COMMUNICATION  **Broad Spectrum Antibiotics should be administered within 1 hour of Sepsis diagnosis**  Time Code Sepsis Called/Page Received: 0822  Antibiotics Ordered: Ceftriaxone  Time of 1st antibiotic administration: 0924  Additional action taken by pharmacy: called nursing  If necessary, Name of Provider/Nurse Contacted: [place] ,PharmD Clinical Pharmacist  07/07/2020  8:31 AM

## 2020-07-08 DIAGNOSIS — D509 Iron deficiency anemia, unspecified: Secondary | ICD-10-CM

## 2020-07-08 DIAGNOSIS — N184 Chronic kidney disease, stage 4 (severe): Secondary | ICD-10-CM

## 2020-07-08 DIAGNOSIS — K7200 Acute and subacute hepatic failure without coma: Principal | ICD-10-CM

## 2020-07-08 DIAGNOSIS — F419 Anxiety disorder, unspecified: Secondary | ICD-10-CM

## 2020-07-08 LAB — BASIC METABOLIC PANEL
Anion gap: 13 (ref 5–15)
BUN: 72 mg/dL — ABNORMAL HIGH (ref 8–23)
CO2: 29 mmol/L (ref 22–32)
Calcium: 8.7 mg/dL — ABNORMAL LOW (ref 8.9–10.3)
Chloride: 99 mmol/L (ref 98–111)
Creatinine, Ser: 2.63 mg/dL — ABNORMAL HIGH (ref 0.61–1.24)
GFR calc non Af Amer: 24 mL/min — ABNORMAL LOW (ref >60)
Glucose, Bld: 206 mg/dL — ABNORMAL HIGH (ref 70–99)
Potassium: 2.7 mmol/L — CL (ref 3.5–5.1)
Sodium: 141 mmol/L (ref 135–145)

## 2020-07-08 LAB — CBC
HCT: 21.7 % — ABNORMAL LOW (ref 39.0–52.0)
Hemoglobin: 7.3 g/dL — ABNORMAL LOW (ref 13.0–17.0)
MCH: 30.8 pg (ref 26.0–34.0)
MCHC: 33.6 g/dL (ref 30.0–36.0)
MCV: 91.6 fL (ref 80.0–100.0)
Platelets: 72 10*3/uL — ABNORMAL LOW (ref 150–400)
RBC: 2.37 MIL/uL — ABNORMAL LOW (ref 4.22–5.81)
RDW: 15 % (ref 11.5–15.5)
WBC: 4.6 10*3/uL (ref 4.0–10.5)
nRBC: 0 % (ref 0.0–0.2)

## 2020-07-08 LAB — GLUCOSE, CAPILLARY
Glucose-Capillary: 192 mg/dL — ABNORMAL HIGH (ref 70–99)
Glucose-Capillary: 164 mg/dL — ABNORMAL HIGH (ref 70–99)
Glucose-Capillary: 233 mg/dL — ABNORMAL HIGH (ref 70–99)
Glucose-Capillary: 279 mg/dL — ABNORMAL HIGH (ref 70–99)
Glucose-Capillary: 318 mg/dL — ABNORMAL HIGH (ref 70–99)

## 2020-07-08 LAB — URINE CULTURE: Culture: NO GROWTH

## 2020-07-08 MED ORDER — HYDRALAZINE HCL 50 MG PO TABS
50.0000 mg | ORAL_TABLET | Freq: Two times a day (BID) | ORAL | Status: DC
  Administered 2020-07-08 – 2020-07-09 (×3): 50 mg via ORAL
  Filled 2020-07-08 (×3): qty 1

## 2020-07-08 MED ORDER — INSULIN ASPART 100 UNIT/ML ~~LOC~~ SOLN
0.0000 [IU] | Freq: Every day | SUBCUTANEOUS | Status: DC
  Administered 2020-07-08: 4 [IU] via SUBCUTANEOUS
  Filled 2020-07-08: qty 1

## 2020-07-08 MED ORDER — LORAZEPAM 0.5 MG PO TABS
0.5000 mg | ORAL_TABLET | Freq: Three times a day (TID) | ORAL | Status: DC | PRN
  Administered 2020-07-08 (×2): 0.5 mg via ORAL
  Filled 2020-07-08 (×3): qty 1

## 2020-07-08 MED ORDER — SALINE SPRAY 0.65 % NA SOLN
1.0000 | NASAL | Status: DC | PRN
  Filled 2020-07-08: qty 44

## 2020-07-08 MED ORDER — ATORVASTATIN CALCIUM 20 MG PO TABS
20.0000 mg | ORAL_TABLET | Freq: Every evening | ORAL | Status: DC
  Administered 2020-07-08: 20 mg via ORAL
  Filled 2020-07-08: qty 1

## 2020-07-08 MED ORDER — TAMSULOSIN HCL 0.4 MG PO CAPS
0.4000 mg | ORAL_CAPSULE | Freq: Every day | ORAL | Status: DC
  Administered 2020-07-08: 0.4 mg via ORAL
  Filled 2020-07-08: qty 1

## 2020-07-08 MED ORDER — IPRATROPIUM-ALBUTEROL 0.5-2.5 (3) MG/3ML IN SOLN: 3.0000 mL | Freq: Four times a day (QID) | RESPIRATORY_TRACT | Status: DC | PRN

## 2020-07-08 MED ORDER — POTASSIUM CHLORIDE CRYS ER 20 MEQ PO TBCR
40.0000 meq | EXTENDED_RELEASE_TABLET | ORAL | Status: AC
  Administered 2020-07-08 (×3): 40 meq via ORAL
  Filled 2020-07-08 (×3): qty 2

## 2020-07-08 MED ORDER — PANTOPRAZOLE SODIUM 40 MG PO TBEC
40.0000 mg | DELAYED_RELEASE_TABLET | Freq: Two times a day (BID) | ORAL | Status: DC
  Administered 2020-07-08 – 2020-07-09 (×3): 40 mg via ORAL
  Filled 2020-07-08 (×3): qty 1

## 2020-07-08 MED ORDER — INSULIN ASPART 100 UNIT/ML ~~LOC~~ SOLN
0.0000 [IU] | Freq: Three times a day (TID) | SUBCUTANEOUS | Status: DC
  Administered 2020-07-08: 7 [IU] via SUBCUTANEOUS
  Administered 2020-07-09 (×2): 9 [IU] via SUBCUTANEOUS
  Filled 2020-07-08 (×2): qty 1

## 2020-07-08 NOTE — Progress Notes (Signed)
Inpatient Diabetes Program Recommendations  AACE/ADA: New Consensus Statement on Inpatient Glycemic Control (2015)  Target Ranges:  Prepandial:   less than 140 mg/dL      Peak postprandial:   less than 180 mg/dL (1-2 hours)      Critically ill patients:  140 - 180 mg/dL    Results for BESNIK, FEBUS (MRN [id number]) as of 07/08/2020 09:07  Ref. Range 07/07/2020 05:41 07/07/2020 18:33 07/07/2020 21:17 07/07/2020 23:26 07/08/2020 05:16 07/08/2020 07:45  Glucose-Capillary Latest Ref Range: 70 - 99 mg/dL 96 117 (H) 95 235 (H) 192 (H) 164 (H)    Admit with: Acute hepatic encephalopathy/ Hypoglycemia  History: DM, COPD, Cirrhosis, CHF, CKD  Home DM Meds: 70/30 Insulin 0-84 units BID with meals per SSI  Current Insulin Orders: None--Checking CBGs Q4 hours     MD- If CBGs rise greater than 180, please consider adding Novolog Sensitive Correction Scale/ SSI (0-9 units) Q4 hours to start     --Will follow patient during hospitalization--  Wyn Quaker RN, MSN, CDE Diabetes Coordinator Inpatient Glycemic Control Team Team Pager: [phone] (8a-5p)

## 2020-07-08 NOTE — Progress Notes (Signed)
PROGRESS NOTE    Corey West   WUX:[id number]  DOB: [birth year]  PCP: Leonel Ramsay, MD    DOA: 07/07/2020 LOS: 1   Brief Narrative   Corey West is a 69 y.o. male with medical history significant of hypertension, hyperlipidemia, diabetes mellitus, COPD, GERD, depression, anxiety, venous insufficiency change, OSA not on CPAP, pancytopenia, liver cirrhosis, esophageal varices, CHF, AVM of the stomach, GAVE, upper GI bleeding, iron deficiency anemia, pancytopenia, thrombocytopenia, C. difficile colitis, hepatic encephalopathy, CKD stage IV, who presented to the ED from home with altered mental status (confusion, lethargic, not verbally responsive).  EMS noted hypoglycemia in 60's, improved with oral glucose.  He had been in rehab at [facility] from 9/22 to 10/3 when he was discharged home.  He reports his nephew has been staying with him and things going well at home.  He apparently had been constipated for a couple of days.    Ammonia level found to be elevated at 189 on presentation.  Patient's mental status returned to baseline after lactulose enemas.       Assessment & Plan   Principal Problem:   Acute hepatic encephalopathy Active Problems:   Iron deficiency anemia   Thrombocytopenia (HCC)   CKD (chronic kidney disease) stage 4, GFR 15-29 ml/min (HCC)   Gastroesophageal reflux disease   Hyperlipidemia   Anxiety   Cirrhosis (HCC)   Hypokalemia   Hypertension   Chronic diastolic CHF (congestive heart failure) (HCC)   Type II diabetes mellitus with renal manifestations (HCC)   COPD with chronic bronchitis (HCC)   Hypoglycemia   SIRS (systemic inflammatory response syndrome) (HCC)   Acute hepatic encephalopathy - Presented with altered mental status and elevated ammonia 189.  CT head is negative for acute intracranial abnormalities.  Clinically consistent with hepatic encephalopathy.  Started on lactulose enemas.  Mentation at baseline this AM. --continue lactulose 30 g PO  TID --fecal mgmt system in place, can remove once patient able to get to commode  SIRS (systemic inflammatory response syndrome): POA met SIRS criteria with tachycardia, tachypnea, and hypothermia.  No leukocytosis.  No source of infection identified, UA and CXR negative, cultures negative to date.  Lactic acid is normal 1.7.  Hemodynamically stable.  Initially started on empiric Rocephin.   --Stop antibiotics and monitor clinically --Follow-up of blood cultures  Hypoglycemia - with EMS blood sugar 69 -->80.  Resolved, now hyperglycemic.   Likely was due to decreased oral intake with encephalopathy and continuation of insulin. --Hold 70/30 insulin --Sliding scale Novolog for now, monitor  Hx of cirrhosis - presenting with hepatic encephalopathy.  INR 1.4, Plt 69k on admission.  LFT's within normal except albumin 2.9.  Gastroesophageal reflux disease - Resume oral Protonix, d/c IV  Hyperlipidemia - resume Lipitor  Iron deficiency anemia Hemoglobin stable 8.0 (8.3 on 06/16/2020) >> 7.3 this AM (10/8) --Monitor Hbg closely, will transfuse if Hbg<7.0 --Hold oral iron supplement, consider IV iron infusion  Thrombocytopenia - likely due to liver cirrhosis.  Platelets 69k on admission, no active bleeding --Daily CBC's  Hypokalemia - Potassium 2.7 this AM, replacing orally.   --Daily BMP to monitor --On K at home with diuretics, will resume scheduled when diuretics resumed  Hypertension - resume home PO hydralazine, continue PRN IV hydralazine. -Hold diuretics as below  Chronic diastolic CHF - presented with trace LE edema, chronic venous stasis in both legs.  No respiratory distress or hypoxia. Prior Echo on 04/24/2020 showed EF of 25-36%, grade 2 diastolic dysfunction.  Compensated. --likely resume torsemide tomorrow, hold today given critically low K --also takes metolazone 3x/week  Type II diabetes mellitus -  Recent A1c 5.7, well controlled.  Takes 70/30 insulin at home,  was hypoglycemic per EMS. --Hold 70/30 insulin due to hypoglycemia --Monitor CBG's --Start sensitive sliding scale Novolog   Anxiety - resume home Ativan now that mentation improved  CKD stage 4 - Creatinine close to baseline, 2.6-2.8 recently.   --Daily BMP's to monitor --Avoid nephrotoxins and hypotension, renally dose meds    COPD with chronic bronchitis - no acutely exacerbated.  Continue home Combivent.  PRN Albuterol.    Morbid obesity: Body mass index is 40.37 kg/m.  Complicates overall care and prognosis.   DVT prophylaxis: SCDs Start: 07/07/20 2055   Diet:  Diet Orders (From admission, onward)    Start     Ordered   07/07/20 2233  Diet heart healthy/carb modified Room service appropriate? Yes; Fluid consistency: Thin  Diet effective now       Question Answer Comment  Diet-HS Snack? Nothing   Room service appropriate? Yes   Fluid consistency: Thin      07/07/20 2232            Code Status: Full Code    Subjective 07/08/20    Patient seen this AM, reports feeling better.  He says "I did this to myself", and expresses regret.  He confirms he was a [facility] for rehab, PCP arranged for him to go.  Doing fairly well with nephew's help at home, but had not been having regular BM's.  Denies fever/chills, abdominal pain, N/V.     Disposition Plan & Communication   Status is: Inpatient  Remains inpatient appropriate because:Inpatient level of care appropriate due to severity of illness   Dispo: The patient is from: Home              Anticipated d/c is to: Home              Anticipated d/c date is: 1 day              Patient currently is not medically stable to d/c.   Family Communication: none at bedside, will attempt to call    Consults, Procedures, Significant Events   Consultants:   None  Procedures:   None  Antimicrobials:  Anti-infectives (From admission, onward)   Start     Dose/Rate Route Frequency Ordered Stop   07/07/20 1000  cefTRIAXone  (ROCEPHIN) 2 g in sodium chloride 0.9 % 100 mL IVPB  Status:  Discontinued        2 g 200 mL/hr over 30 Minutes Intravenous Every 24 hours 07/07/20 0821 07/08/20 0802        Objective   Vitals:   07/08/20 0500 07/08/20 0518 07/08/20 0927 07/08/20 1154  BP:  (!) 136/54 (!) 132/57 (!) 151/58  Pulse:  (!) 57 67 74  Resp:  15 15   Temp:  98.2 F (36.8 C) 98 F (36.7 C) 98.9 F (37.2 C)  TempSrc:  Oral    SpO2:  100% 100% 100%  Weight: 124 kg     Height:        Intake/Output Summary (Last 24 hours) at 07/08/2020 1209 Last data filed at 07/08/2020 0900 Gross per 24 hour  Intake 2400 ml  Output 2700 ml  Net -300 ml   Filed Weights   07/07/20 2325 07/08/20 0500  Weight: 124.8 kg 124 kg    Physical Exam:  General  exam: awake, alert, no acute distress, obese Respiratory system: CTAB, no wheezes, rales or rhonchi, normal respiratory effort. Cardiovascular system: normal S1/S2, RRR, trace lower extremityedema.   Gastrointestinal system: soft, NT, ND, +bowel sounds, rectal tube in place. Central nervous system: A&O x4. no gross focal neurologic deficits, normal speech Extremities: moves all, b/l LE venous stasis with minimal edema Psychiatry: normal mood, congruent affect, judgement and insight appear normal  Labs   Data Reviewed: I have personally reviewed following labs and imaging studies  CBC: Recent Labs  Lab 07/07/20 0603 07/08/20 0540  WBC 4.5 4.6  NEUTROABS 3.7  --   HGB 8.0* 7.3*  HCT 22.3* 21.7*  MCV 87.8 91.6  PLT 69* 72*   Basic Metabolic Panel: Recent Labs  Lab 07/07/20 0603 07/08/20 0540  NA 139 141  K 2.7* 2.7*  CL 100 99  CO2 27 29  GLUCOSE 92 206*  BUN 77* 72*  CREATININE 2.90* 2.63*  CALCIUM 8.9 8.7*  MG 2.0  --    GFR: Estimated Creatinine Clearance: 35 mL/min (A) (by C-G formula based on SCr of 2.63 mg/dL (H)). Liver Function Tests: Recent Labs  Lab 07/07/20 0603  AST 40  ALT 30  ALKPHOS 78  BILITOT 0.9  PROT 6.5  ALBUMIN  2.9*   No results for input(s): LIPASE, AMYLASE in the last 168 hours. Recent Labs  Lab 07/06/20 1126 07/07/20 0603  AMMONIA 102* 189*   Coagulation Profile: Recent Labs  Lab 07/07/20 1110  INR 1.4*   Cardiac Enzymes: No results for input(s): CKTOTAL, CKMB, CKMBINDEX, TROPONINI in the last 168 hours. BNP (last 3 results) No results for input(s): PROBNP in the last 8760 hours. HbA1C: No results for input(s): HGBA1C in the last 72 hours. CBG: Recent Labs  Lab 07/07/20 2117 07/07/20 2326 07/08/20 0516 07/08/20 0745 07/08/20 1151  GLUCAP 95 235* 192* 164* 233*   Lipid Profile: No results for input(s): CHOL, HDL, LDLCALC, TRIG, CHOLHDL, LDLDIRECT in the last 72 hours. Thyroid Function Tests: Recent Labs    07/07/20 0603  TSH 2.116  FREET4 1.11   Anemia Panel: No results for input(s): VITAMINB12, FOLATE, FERRITIN, TIBC, IRON, RETICCTPCT in the last 72 hours. Sepsis Labs: Recent Labs  Lab 07/07/20 0603 07/07/20 1110  PROCALCITON  --  0.18  LATICACIDVEN 1.7 1.4    Recent Results (from the past 240 hour(s))  Culture, blood (routine x 2)     Status: None (Preliminary result)   Collection Time: 07/07/20  6:03 AM   Specimen: BLOOD  Result Value Ref Range Status   Specimen Description BLOOD BLOOD RIGHT HAND  Final   Special Requests   Final    BOTTLES DRAWN AEROBIC AND ANAEROBIC Blood Culture adequate volume   Culture   Final    NO GROWTH < 24 HOURS Performed at [facility], [address], [place], [place] [address]    Report Status PENDING  Incomplete  Culture, blood (routine x 2)     Status: None (Preliminary result)   Collection Time: 07/07/20  6:03 AM   Specimen: BLOOD  Result Value Ref Range Status   Specimen Description BLOOD BLOOD LEFT ARM  Final   Special Requests   Final    BOTTLES DRAWN AEROBIC AND ANAEROBIC Blood Culture adequate volume   Culture   Final    NO GROWTH < 24 HOURS Performed at [facility], [address], [place], [place] [address]    Report Status PENDING  Incomplete  Respiratory Panel  by RT PCR (Flu A&B, Covid) - Nasopharyngeal Swab     Status: None   Collection Time: 07/07/20  6:03 AM   Specimen: Nasopharyngeal Swab  Result Value Ref Range Status   SARS Coronavirus 2 by RT PCR NEGATIVE NEGATIVE Final    Comment: (NOTE) SARS-CoV-2 target nucleic acids are NOT DETECTED.  The SARS-CoV-2 RNA is generally detectable in upper respiratoy specimens during the acute phase of infection. The lowest concentration of SARS-CoV-2 viral copies this assay can detect is 131 copies/mL. A negative result does not preclude SARS-Cov-2 infection and should not be used as the sole basis for treatment or other patient management decisions. A negative result may occur with  improper specimen collection/handling, submission of specimen other than nasopharyngeal swab, presence of viral mutation(s) within the areas targeted by this assay, and inadequate number of viral copies (<131 copies/mL). A negative result must be combined with clinical observations, patient history, and epidemiological information. The expected result is Negative.  Fact Sheet for Patients:  PinkCheek.be  Fact Sheet for Healthcare Providers:  GravelBags.it  This test is no t yet approved or cleared by the [place] FDA and  has been authorized for detection and/or diagnosis of SARS-CoV-2 by FDA under an Emergency Use Authorization (EUA). This EUA will remain  in effect (meaning this test can be used) for the duration of the COVID-19 declaration under Section 564(b)(1) of the Act, 21 U.S.C. section 360bbb-3(b)(1), unless the authorization is terminated or revoked sooner.     Influenza A by PCR NEGATIVE NEGATIVE Final   Influenza B by PCR NEGATIVE NEGATIVE Final    Comment: (NOTE) The Xpert Xpress SARS-CoV-2/FLU/RSV assay is intended as an aid in  the diagnosis of  influenza from Nasopharyngeal swab specimens and  should not be used as a sole basis for treatment. Nasal washings and  aspirates are unacceptable for Xpert Xpress SARS-CoV-2/FLU/RSV  testing.  Fact Sheet for Patients: PinkCheek.be  Fact Sheet for Healthcare Providers: GravelBags.it  This test is not yet approved or cleared by the [place] FDA and  has been authorized for detection and/or diagnosis of SARS-CoV-2 by  FDA under an Emergency Use Authorization (EUA). This EUA will remain  in effect (meaning this test can be used) for the duration of the  Covid-19 declaration under Section 564(b)(1) of the Act, 21  U.S.C. section 360bbb-3(b)(1), unless the authorization is  terminated or revoked. Performed at [facility], [address] [place], [place] [address]   Urine culture     Status: None   Collection Time: 07/07/20 10:35 AM   Specimen: Urine, Random  Result Value Ref Range Status   Specimen Description   Final    URINE, RANDOM Performed at [facility], [address], [place], [place] [address]    Special Requests   Final    NONE Performed at [facility], [address], [place], [place] [address]    Culture   Final    NO GROWTH Performed at [facility], [place] [address] [place], [place] [address]    Report Status 07/08/2020 FINAL  Final      Imaging Studies   CT Head Wo Contrast  Result Date: 07/07/2020 CLINICAL DATA:  Mental status changes. EXAM: CT HEAD WITHOUT CONTRAST TECHNIQUE: Contiguous axial images were obtained from the base of the skull through the vertex without intravenous contrast. COMPARISON:  06/16/2020 FINDINGS: Brain: There is no evidence for acute hemorrhage, hydrocephalus, mass lesion, or abnormal extra-axial fluid collection.  No definite CT evidence for acute infarction. Diffuse loss of parenchymal volume is consistent with atrophy. Vascular: No  hyperdense vessel or unexpected calcification. Skull: No evidence for fracture. No worrisome lytic or sclerotic lesion. Sinuses/Orbits: The visualized paranasal sinuses and mastoid air cells are clear. Visualized portions of the globes and intraorbital fat are unremarkable. Other: None. IMPRESSION: 1. Stable.  No acute intracranial abnormality. 2. Atrophy. Electronically Signed   By: Misty Stanley M.D.   On: 07/07/2020 06:27   DG Chest Port 1 View  Result Date: 07/07/2020 CLINICAL DATA:  Altered mental status. EXAM: PORTABLE CHEST 1 VIEW COMPARISON:  06/06/2020. FINDINGS: Mediastinum hilar structures normal. Cardiomegaly with pulmonary venous congestion again noted. Mild bilateral interstitial prominence again noted without interim change. Low lung volumes with bibasilar atelectasis. No prominent pleural effusion. No pneumothorax. IMPRESSION: Cardiomegaly with pulmonary venous congestion again noted. Mild bilateral interstitial prominence again noted without interim change. Low lung volumes with bibasilar atelectasis. Similar findings noted on prior chest x-ray of 06/06/2020. Electronically Signed   By: Marcello Moores  Register   On: 07/07/2020 06:08     Medications   Scheduled Meds:  atorvastatin  20 mg Oral QPM   Chlorhexidine Gluconate Cloth  6 each Topical Daily   hydrALAZINE  50 mg Oral BID   lactulose  30 g Oral TID   pantoprazole  40 mg Oral BID   pantoprazole (PROTONIX) IV  40 mg Intravenous Q24H   potassium chloride  40 mEq Oral Q4H   sodium chloride flush  3 mL Intravenous Q12H   tamsulosin  0.4 mg Oral QPC supper   Continuous Infusions:  sodium chloride Stopped (07/08/20 0938)       LOS: 1 day    Time spent: 30 minutes    Ezekiel Slocumb, DO [facility]  07/08/2020, 12:09 PM    If 7PM-7AM, please contact night-coverage. How to contact the [facility] Attending or Consulting provider [place] or covering provider during after hours [place], for this patient?     1. Check the care team in [facility] and look for a) attending/consulting TRH provider listed and b) the [facility] team listed 2. Log into [email or web address] and use [place]'s universal password to access. If you do not have the password, please contact the hospital operator. 3. Locate the [facility] provider you are looking for under [facility] and page to a number that you can be directly reached. 4. If you still have difficulty reaching the provider, please page the [facility] (Director on Call) for the Hospitalists listed on amion for assistance.

## 2020-07-08 NOTE — Hospital Course (Signed)
Corey West is a 68 y.o. male with medical history significant of hypertension, hyperlipidemia, diabetes mellitus, COPD, GERD, depression, anxiety, venous insufficiency change, OSA not on CPAP, pancytopenia, liver cirrhosis, esophageal varices, CHF, AVM of the stomach, GAVE, upper GI bleeding, iron deficiency anemia, pancytopenia, thrombocytopenia, C. difficile colitis, hepatic encephalopathy, CKD stage IV, who presented to the ED from home with altered mental status (confusion, lethargic, not verbally responsive).  EMS noted hypoglycemia in 60's, improved with oral glucose.  He had been in rehab at [facility] from 9/22 to 10/3 when he was discharged home.  He reports his nephew has been staying with him and things going well at home.  He apparently had been constipated for a couple of days.    Ammonia level found to be elevated at 189 on presentation.  Patient's mental status returned to baseline after lactulose enemas.

## 2020-07-09 LAB — CBC
HCT: 21.3 % — ABNORMAL LOW (ref 39.0–52.0)
Hemoglobin: 7 g/dL — ABNORMAL LOW (ref 13.0–17.0)
MCH: 31 pg (ref 26.0–34.0)
MCHC: 32.9 g/dL (ref 30.0–36.0)
MCV: 94.2 fL (ref 80.0–100.0)
Platelets: 67 10*3/uL — ABNORMAL LOW (ref 150–400)
RBC: 2.26 MIL/uL — ABNORMAL LOW (ref 4.22–5.81)
RDW: 15.5 % (ref 11.5–15.5)
WBC: 5 10*3/uL (ref 4.0–10.5)
nRBC: 0 % (ref 0.0–0.2)

## 2020-07-09 LAB — GLUCOSE, CAPILLARY
Glucose-Capillary: 295 mg/dL — ABNORMAL HIGH (ref 70–99)
Glucose-Capillary: 290 mg/dL — ABNORMAL HIGH (ref 70–99)

## 2020-07-09 LAB — BASIC METABOLIC PANEL
Anion gap: 7 (ref 5–15)
BUN: 72 mg/dL — ABNORMAL HIGH (ref 8–23)
CO2: 28 mmol/L (ref 22–32)
Calcium: 8.6 mg/dL — ABNORMAL LOW (ref 8.9–10.3)
Chloride: 101 mmol/L (ref 98–111)
Creatinine, Ser: 2.69 mg/dL — ABNORMAL HIGH (ref 0.61–1.24)
GFR, Estimated: 23 mL/min — ABNORMAL LOW (ref >60)
Glucose, Bld: 334 mg/dL — ABNORMAL HIGH (ref 70–99)
Potassium: 3.3 mmol/L — ABNORMAL LOW (ref 3.5–5.1)
Sodium: 136 mmol/L (ref 135–145)

## 2020-07-09 LAB — PREPARE RBC (CROSSMATCH)

## 2020-07-09 LAB — MAGNESIUM: Magnesium: 2.1 mg/dL (ref 1.7–2.4)

## 2020-07-09 MED ORDER — RIFAXIMIN 550 MG PO TABS
550.0000 mg | ORAL_TABLET | Freq: Two times a day (BID) | ORAL | Status: DC
  Filled 2020-07-09: qty 1

## 2020-07-09 MED ORDER — SODIUM CHLORIDE 0.9% IV SOLUTION: Freq: Once | INTRAVENOUS | Status: AC

## 2020-07-09 MED ORDER — POTASSIUM CHLORIDE CRYS ER 20 MEQ PO TBCR
40.0000 meq | EXTENDED_RELEASE_TABLET | ORAL | Status: AC
  Administered 2020-07-09 (×2): 40 meq via ORAL
  Filled 2020-07-09 (×2): qty 2

## 2020-07-09 MED ORDER — INSULIN GLARGINE 100 UNIT/ML ~~LOC~~ SOLN
15.0000 [IU] | Freq: Every day | SUBCUTANEOUS | Status: DC
  Administered 2020-07-09: 15 [IU] via SUBCUTANEOUS
  Filled 2020-07-09 (×2): qty 0.15

## 2020-07-09 NOTE — Progress Notes (Signed)
Corey West to be D/C'd hom per MD order. Discussed prescriptions and follow up appointments with the patient. Prescriptions given to patient, medication list explained in detail. Pt verbalized understanding.  Allergies as of 07/09/2020   No Known Allergies     Medication List    TAKE these medications   atorvastatin 10 MG tablet Commonly known as: LIPITOR Take 20 mg by mouth every evening.   Combivent Respimat 20-100 MCG/ACT Aers respimat Generic drug: Ipratropium-Albuterol Inhale 1 puff into the lungs every 6 (six) hours. What changed:   when to take this  reasons to take this   ferrous sulfate 325 (65 FE) MG tablet Take 1 tablet (325 mg total) by mouth daily. This medication can cause black stools   hydrALAZINE 50 MG tablet Commonly known as: APRESOLINE Take 1 tablet (50 mg total) by mouth 2 (two) times daily.   lactulose 10 GM/15ML solution Commonly known as: CHRONULAC Take 90 mLs (60 g total) by mouth daily. What changed:   how much to take  when to take this   LORazepam 0.5 MG tablet Commonly known as: ATIVAN Take 1 tablet (0.5 mg total) by mouth 3 (three) times daily as needed for Anxiety   metolazone 5 MG tablet Commonly known as: ZAROXOLYN Take 5 mg by mouth daily. .   NovoLOG Mix 70/30 FlexPen (70-30) 100 UNIT/ML FlexPen Generic drug: insulin aspart protamine - aspart 0-84 Units as directed. Inject 0 - 84 Units (sliding scale) Subcutaneously twice a day before meals, at breakfast and at dinner.   pantoprazole 40 MG tablet Commonly known as: Protonix Take 1 tablet (40 mg total) by mouth 2 (two) times daily.   rifaximin 550 MG Tabs tablet Commonly known as: XIFAXAN Take 1 tablet (550 mg total) by mouth 2 (two) times daily.   sodium chloride 0.65 % Soln nasal spray Commonly known as: OCEAN Place 1 spray into both nostrils as needed for congestion (nose irritation).   tamsulosin 0.4 MG Caps capsule Commonly known as: FLOMAX Take 1 capsule (0.4 mg  total) by mouth daily after supper.   torsemide 100 MG tablet Commonly known as: DEMADEX Take 1 tablet (100 mg total) by mouth daily.       Vitals:   07/09/20 1459 07/09/20 1715  BP: 135/70 (!) 135/45  Pulse: 73 68  Resp:  (!) 22  Temp: 98.4 F (36.9 C) 98.4 F (36.9 C)  SpO2: 99% 99%    Skin clean, dry and intact without evidence of skin break down, no evidence of skin tears noted. IV catheter discontinued intact. Site without signs and symptoms of complications. Dressing and pressure applied. Pt denies pain at this time. No complaints noted.  An After Visit Summary was printed and given to the patient. Patient escorted via [place], and D/C home via private auto.  Fuller Mandril, RN

## 2020-07-09 NOTE — TOC Transition Note (Addendum)
Transition of Care [facility]) - CM/SW Discharge Note   Patient Details  Name: Corey West MRN: [id number] Date of Birth: 21-Dec-1950  [facility] CM/SW Contact:  Truitt Merle, LCSW Phone Number: 07/09/2020, 4:52 PM   Clinical Narrative:    Patient ready for discharge home with home health services. Spoke with patient and he's agreeable to discharge plan. Spoke with [name] with [facility] who stated they are unable to accept patient back and a referral was made to [facility] services. Spoke with Tillie Rung at [facility] who stated they would be unable to see patient until Thursday. Set up home health with Malachy Mood at [facility]. Patient has a walker and cane at home and nephew staying with patient. Nephew to transport home. Dr. Arbutus Ped and RN updated via secure chat. No further TOC needs.        Patient Goals and CMS Choice        Discharge Placement                       Discharge Plan and Services                                     Social Determinants of Health (SDOH) Interventions     Readmission Risk Interventions Readmission Risk Prevention Plan 06/09/2020 02/02/2020 12/15/2019  Transportation Screening Complete Complete Complete  PCP or Specialist Appt within 3-5 Days - Complete -  HRI or [place] - Complete -  Social Work Consult for [place] Planning/Counseling - Complete -  Palliative Care Screening - Not Applicable -  Medication Review Press photographer) Complete Complete Complete  PCP or Specialist appointment within 3-5 days of discharge Complete - Complete  HRI or Home Care Consult Complete - Complete  SW Recovery Care/Counseling Consult Complete - -  SW Consult Not Complete Comments - - -  Palliative Care Screening Not Applicable - Not [place] Not Applicable - Not Applicable  Some recent data might be hidden

## 2020-07-09 NOTE — Progress Notes (Signed)
OT Cancellation Note  Patient Details Name: Corey West MRN: [id number] DOB: March 01, 1951   Cancelled Treatment:    Reason Eval/Treat Not Completed: Patient not medically ready. Chart reviewed - pt noted to have H&H critically low at 7.0, 21.3; contraindicated for exertional activity at this time. Pt attempted on x2 this AM, awaiting blood transfusion per RN. Will continue to follow and initiate services as pt medically appropriate to participate in therapy.    Dessie Coma, M.S. OTR/L  07/09/20, 2:59 PM  ascom [phone]

## 2020-07-09 NOTE — Progress Notes (Signed)
PT Cancellation Note  Patient Details Name: Corey West MRN: [id number] DOB: July 16, 1951   Cancelled Treatment:    Reason Eval/Treat Not Completed: Medical issues which prohibited therapy.  Pt has hgb 7.0 and will retry as time and pt allow.   Ramond Dial 07/09/2020, 10:57 AM  Mee Hives, PT MS Acute Rehab Dept. Number: [place] and [place]

## 2020-07-09 NOTE — Discharge Summary (Signed)
Physician Discharge Summary  Corey MABE QVZ:[id number] DOB: 1950/11/10 DOA: 07/07/2020  PCP: Leonel Ramsay, MD  Admit date: 07/07/2020 Discharge date: 07/09/2020  Admitted From: home Disposition:  home  Recommendations for Outpatient Follow-up:  1. Follow up with PCP in 1-2 weeks 2. Please obtain BMP/CBC in one week 3. Please follow up on patient's insulin regimen and provide instructions to adjust dosing if needed.  He currently is not requiring as much insulin as his last prescription (84 units AM, 80 units PM based on pharmacist's review of records).     Home Health: Yes  Equipment/Devices: None   Discharge Condition: Stable  CODE STATUS: Full  Diet recommendation: Heart Healthy / Carb Modified   Discharge Diagnoses: Principal Problem:   Acute hepatic encephalopathy Active Problems:   Iron deficiency anemia   Thrombocytopenia (HCC)   CKD (chronic kidney disease) stage 4, GFR 15-29 ml/min (HCC)   Gastroesophageal reflux disease   Hyperlipidemia   Anxiety   Cirrhosis (HCC)   Hypokalemia   Hypertension   Chronic diastolic CHF (congestive heart failure) (HCC)   Type II diabetes mellitus with renal manifestations (HCC)   COPD with chronic bronchitis (HCC)   Hypoglycemia   SIRS (systemic inflammatory response syndrome) (HCC)    Summary of HPI and Hospital Course:  Corey West is a 69 y.o. male with medical history significant of hypertension, hyperlipidemia, diabetes mellitus, COPD, GERD, depression, anxiety, venous insufficiency change, OSA not on CPAP, pancytopenia, liver cirrhosis, esophageal varices, CHF, AVM of the stomach, GAVE, upper GI bleeding, iron deficiency anemia, pancytopenia, thrombocytopenia, C. difficile colitis, hepatic encephalopathy, CKD stage IV, who presented to the ED from home with altered mental status (confusion, lethargic, not verbally responsive).  EMS noted hypoglycemia in 60's, improved with oral glucose.  He had been in rehab at [facility] from  9/22 to 10/3 when he was discharged home.  He reports his nephew has been staying with him and things going well at home.  He apparently had been constipated for a couple of days.    Ammonia level found to be elevated at 189 on presentation.  Patient's mental status returned to baseline after lactulose enemas.   Patient clinically improved rapidly with lactulose, was at baseline mental status the day after admission.    Acute hepatic encephalopathy - Presented with altered mental status and elevated ammonia 189. CT head is negative for acute intracranial abnormalities. Clinically consistent with hepatic encephalopathy.  Started on lactulose enemas, followed by 30 g PO TID.  Mentation at baseline the morning after admission.  SIRS (systemic inflammatory response syndrome):POA met SIRS criteria with tachycardia, tachypnea, and hypothermia.  No leukocytosis. No source of infection identified, UA and CXR negative, cultures negative to date. Lactic acid is normal. 1.7. Hemodynamically stable.  Initially started on empiric Rocephin.   --Stopped empiric antibiotics and patient remained clinically asymptomatic without signs of infection --Blood cultures negative to date, follow to final  Hx of cirrhosis - presenting with hepatic encephalopathy.  INR 1.4, Plt 69k on admission.  LFT's within normal except albumin 2.9.  Iron deficiency anemiaHemoglobin stable 8.0 (8.3 on 06/16/2020) >> 7.3 this AM (10/8) --Monitor Hbg closely, will transfuse if Hbg<7.0 Patient was transfused 1 units pRBC's today prior to discharge.   PCP and/or hematology follow up.  Thrombocytopenia - likely due to liver cirrhosis. Platelets 69k on admission, no active bleeding --CBC in follow up  Hypokalemia - Potassium 2.7, replaced orally.  Likely due to GI loss with treating hepatic encephalopathy. --On  K supplement at home with diuretics  Hypertension - resume home PO hydralazine, diuretics  Chronic diastolic  CHF - presented with trace LE edema, chronic venous stasis in both legs. No respiratory distress or hypoxia. Prior Echo on 04/24/2020 showed EF of 01-60%, grade 2 diastolic dysfunction. Compensated. Diuretics were initially held due to hypokalemia.  Resumed diuretics on discharge.  Hypoglycemia - with EMS blood sugar 69 -->80. Resolved, now hyperglycemic.   Likely was due to decreased oral intake with encephalopathy and continuation of insulin.  Type II diabetes mellitus - Recent A1c 5.7, well controlled. Takes 70/30 insulin at home, was hypoglycemic per EMS. --Held 70/30 insulin due to hypoglycemia --Covered with sliding scale Novolog  Pt asked for instructions to dose his 70/30 insulin at home, not sure how much to give if his usual dose is too much. Provided printed titration instructions from manufacturer web site, and educated patient. He agrees he will be seeing his PCP within just a few days and will get more detailed instructions.  Anxiety - initially held home Ativan, resumed once mentation improved  CKD stage 4 - Creatinine close to baseline, 2.6-2.8 recently. Avoid nephrotoxins and hypotension, renally dose meds.   COPD with chronic bronchitis - no acutely exacerbated.  Continue home Combivent.  PRN Albuterol.   Gastroesophageal reflux disease -Protonix  Hyperlipidemia - Lipitor  Morbid obesity: Body mass index is 40.37 kg/m.  Complicates overall care and prognosis.          Discharge Instructions   Discharge Instructions    (HEART FAILURE PATIENTS) Call MD:  Anytime you have any of the following symptoms: 1) 3 pound weight gain in 24 hours or 5 pounds in 1 week 2) shortness of breath, with or without a dry hacking cough 3) swelling in the hands, feet or stomach 4) if you have to sleep on extra pillows at night in order to breathe.   Complete by: As directed    Call MD for:  extreme fatigue   Complete by: As directed    Call MD for:  persistant  dizziness or light-headedness   Complete by: As directed    Call MD for:  persistant nausea and vomiting   Complete by: As directed    Call MD for:  severe uncontrolled pain   Complete by: As directed    Call MD for:  temperature >100.4   Complete by: As directed    Diet - low sodium heart healthy   Complete by: As directed    Discharge instructions   Complete by: As directed    Please be sure to take your lactulose every day. Your goal is to have 2-3 soft BM's daily to prevent accumulation of ammonia in your bloodstream, causing confusion, lethargy and sometimes even coma.  Very important to keep your bowels moving.  Regarding your insulin, please discuss your dose with primary care this week in follow up.   Your last prescription was for 84 units AM, 80 units PM.  For now, I would recommend starting with 25 units AM and PM (with breakfast and dinner). I printed the "titration" schedule from the manufacturer's web site - you can add extra units of insulin based on your current blood sugar at mealtime.  For example, if your sugar is above 180, you would add an extra 6 units.    Write down your blood sugars and how much insulin you are taking - bring this to primary care appointment so they can determine the best dose for  you.   Increase activity slowly   Complete by: As directed      Allergies as of 07/09/2020   No Known Allergies     Medication List    TAKE these medications   atorvastatin 10 MG tablet Commonly known as: LIPITOR Take 20 mg by mouth every evening.   Combivent Respimat 20-100 MCG/ACT Aers respimat Generic drug: Ipratropium-Albuterol Inhale 1 puff into the lungs every 6 (six) hours. What changed:   when to take this  reasons to take this   ferrous sulfate 325 (65 FE) MG tablet Take 1 tablet (325 mg total) by mouth daily. This medication can cause black stools   hydrALAZINE 50 MG tablet Commonly known as: APRESOLINE Take 1 tablet (50 mg total) by mouth 2  (two) times daily.   lactulose 10 GM/15ML solution Commonly known as: CHRONULAC Take 90 mLs (60 g total) by mouth daily. What changed:   how much to take  when to take this   LORazepam 0.5 MG tablet Commonly known as: ATIVAN Take 1 tablet (0.5 mg total) by mouth 3 (three) times daily as needed for Anxiety   metolazone 5 MG tablet Commonly known as: ZAROXOLYN Take 5 mg by mouth daily. .   NovoLOG Mix 70/30 FlexPen (70-30) 100 UNIT/ML FlexPen Generic drug: insulin aspart protamine - aspart 0-84 Units as directed. Inject 0 - 84 Units (sliding scale) Subcutaneously twice a day before meals, at breakfast and at dinner.   pantoprazole 40 MG tablet Commonly known as: Protonix Take 1 tablet (40 mg total) by mouth 2 (two) times daily.   rifaximin 550 MG Tabs tablet Commonly known as: XIFAXAN Take 1 tablet (550 mg total) by mouth 2 (two) times daily.   sodium chloride 0.65 % Soln nasal spray Commonly known as: OCEAN Place 1 spray into both nostrils as needed for congestion (nose irritation).   tamsulosin 0.4 MG Caps capsule Commonly known as: FLOMAX Take 1 capsule (0.4 mg total) by mouth daily after supper.   torsemide 100 MG tablet Commonly known as: DEMADEX Take 1 tablet (100 mg total) by mouth daily.       No Known Allergies  Consultations:  None    Procedures/Studies: CT Head Wo Contrast  Result Date: 07/07/2020 CLINICAL DATA:  Mental status changes. EXAM: CT HEAD WITHOUT CONTRAST TECHNIQUE: Contiguous axial images were obtained from the base of the skull through the vertex without intravenous contrast. COMPARISON:  06/16/2020 FINDINGS: Brain: There is no evidence for acute hemorrhage, hydrocephalus, mass lesion, or abnormal extra-axial fluid collection. No definite CT evidence for acute infarction. Diffuse loss of parenchymal volume is consistent with atrophy. Vascular: No hyperdense vessel or unexpected calcification. Skull: No evidence for fracture. No worrisome  lytic or sclerotic lesion. Sinuses/Orbits: The visualized paranasal sinuses and mastoid air cells are clear. Visualized portions of the globes and intraorbital fat are unremarkable. Other: None. IMPRESSION: 1. Stable.  No acute intracranial abnormality. 2. Atrophy. Electronically Signed   By: Misty Stanley M.D.   On: 07/07/2020 06:27   CT HEAD WO CONTRAST  Result Date: 06/16/2020 CLINICAL DATA:  Head trauma, minor. Mental status change, unknown cause. EXAM: CT HEAD WITHOUT CONTRAST TECHNIQUE: Contiguous axial images were obtained from the base of the skull through the vertex without intravenous contrast. COMPARISON:  Head CT 06/06/2020. FINDINGS: Brain: Stable, mild generalized parenchymal atrophy. There is no acute intracranial hemorrhage. No demarcated cortical infarct. No extra-axial fluid collection. No evidence of intracranial mass. No midline shift. Vascular: No hyperdense vessel.  Atherosclerotic calcifications Skull: Normal. Negative for fracture or focal lesion. Sinuses/Orbits: Visualized orbits show no acute finding. Other: Mild ethmoid sinus mucosal thickening. No significant mastoid effusion. IMPRESSION: No evidence of acute intracranial abnormality. Stable, mild generalized parenchymal atrophy. Mild ethmoid sinus mucosal thickening. Electronically Signed   By: Kellie Simmering DO   On: 06/16/2020 18:27   DG Chest Port 1 View  Result Date: 07/07/2020 CLINICAL DATA:  Altered mental status. EXAM: PORTABLE CHEST 1 VIEW COMPARISON:  06/06/2020. FINDINGS: Mediastinum hilar structures normal. Cardiomegaly with pulmonary venous congestion again noted. Mild bilateral interstitial prominence again noted without interim change. Low lung volumes with bibasilar atelectasis. No prominent pleural effusion. No pneumothorax. IMPRESSION: Cardiomegaly with pulmonary venous congestion again noted. Mild bilateral interstitial prominence again noted without interim change. Low lung volumes with bibasilar atelectasis.  Similar findings noted on prior chest x-ray of 06/06/2020. Electronically Signed   By: Marcello Moores  Register   On: 07/07/2020 06:08       Subjective: Pt seen this AM.  He wants to go home today, says he is going home today.  Asks me to tell nurse to get in there right away with blood for transfusion.  He denies any complaints other than wanting to go home.     Discharge Exam: Vitals:   07/09/20 1427 07/09/20 1459  BP: (!) 129/42 135/70  Pulse: 74 73  Resp:    Temp: 98.1 F (36.7 C) 98.4 F (36.9 C)  SpO2: 100% 99%   Vitals:   07/09/20 0505 07/09/20 0757 07/09/20 1427 07/09/20 1459  BP: (!) 127/46 (!) 135/58 (!) 129/42 135/70  Pulse: 67 65 74 73  Resp: 20 16    Temp: 98.2 F (36.8 C) 98.5 F (36.9 C) 98.1 F (36.7 C) 98.4 F (36.9 C)  TempSrc: Oral Oral Oral Oral  SpO2: 100% 100% 100% 99%  Weight:      Height:        General: Pt is alert, awake, not in acute distress Cardiovascular: RRR, S1/S2 +, no rubs, no gallops Respiratory: CTA bilaterally, no wheezing, no rhonchi Abdominal: Soft, NT, ND, bowel sounds +, rectal tube in place Extremities: trace edema, LE venous stasis changes    The results of significant diagnostics from this hospitalization (including imaging, microbiology, ancillary and laboratory) are listed below for reference.     Microbiology: Recent Results (from the past 240 hour(s))  Culture, blood (routine x 2)     Status: None (Preliminary result)   Collection Time: 07/07/20  6:03 AM   Specimen: BLOOD  Result Value Ref Range Status   Specimen Description BLOOD BLOOD RIGHT HAND  Final   Special Requests   Final    BOTTLES DRAWN AEROBIC AND ANAEROBIC Blood Culture adequate volume   Culture   Final    NO GROWTH < 24 HOURS Performed at [facility], [address] [place], [place] [address]    Report Status PENDING  Incomplete  Culture, blood (routine x 2)     Status: None (Preliminary result)   Collection Time: 07/07/20  6:03 AM    Specimen: BLOOD  Result Value Ref Range Status   Specimen Description BLOOD BLOOD LEFT ARM  Final   Special Requests   Final    BOTTLES DRAWN AEROBIC AND ANAEROBIC Blood Culture adequate volume   Culture   Final    NO GROWTH < 24 HOURS Performed at [facility], [address] [place], [place] [address]    Report Status PENDING  Incomplete  Respiratory Panel by RT PCR (Flu A&B, Covid) - Nasopharyngeal Swab     Status: None   Collection Time: 07/07/20  6:03 AM   Specimen: Nasopharyngeal Swab  Result Value Ref Range Status   SARS Coronavirus 2 by RT PCR NEGATIVE NEGATIVE Final    Comment: (NOTE) SARS-CoV-2 target nucleic acids are NOT DETECTED.  The SARS-CoV-2 RNA is generally detectable in upper respiratoy specimens during the acute phase of infection. The lowest concentration of SARS-CoV-2 viral copies this assay can detect is 131 copies/mL. A negative result does not preclude SARS-Cov-2 infection and should not be used as the sole basis for treatment or other patient management decisions. A negative result may occur with  improper specimen collection/handling, submission of specimen other than nasopharyngeal swab, presence of viral mutation(s) within the areas targeted by this assay, and inadequate number of viral copies (<131 copies/mL). A negative result must be combined with clinical observations, patient history, and epidemiological information. The expected result is Negative.  Fact Sheet for Patients:  PinkCheek.be  Fact Sheet for Healthcare Providers:  GravelBags.it  This test is no t yet approved or cleared by the [place] FDA and  has been authorized for detection and/or diagnosis of SARS-CoV-2 by FDA under an Emergency Use Authorization (EUA). This EUA will remain  in effect (meaning this test can be used) for the duration of the COVID-19 declaration under Section 564(b)(1) of the Act, 21  U.S.C. section 360bbb-3(b)(1), unless the authorization is terminated or revoked sooner.     Influenza A by PCR NEGATIVE NEGATIVE Final   Influenza B by PCR NEGATIVE NEGATIVE Final    Comment: (NOTE) The Xpert Xpress SARS-CoV-2/FLU/RSV assay is intended as an aid in  the diagnosis of influenza from Nasopharyngeal swab specimens and  should not be used as a sole basis for treatment. Nasal washings and  aspirates are unacceptable for Xpert Xpress SARS-CoV-2/FLU/RSV  testing.  Fact Sheet for Patients: PinkCheek.be  Fact Sheet for Healthcare Providers: GravelBags.it  This test is not yet approved or cleared by the [place] FDA and  has been authorized for detection and/or diagnosis of SARS-CoV-2 by  FDA under an Emergency Use Authorization (EUA). This EUA will remain  in effect (meaning this test can be used) for the duration of the  Covid-19 declaration under Section 564(b)(1) of the Act, 21  U.S.C. section 360bbb-3(b)(1), unless the authorization is  terminated or revoked. Performed at [facility], [address] [place], [place] [address]   Urine culture     Status: None   Collection Time: 07/07/20 10:35 AM   Specimen: Urine, Random  Result Value Ref Range Status   Specimen Description   Final    URINE, RANDOM Performed at [facility], [address], [place], [place] [address]    Special Requests   Final    NONE Performed at [facility], [address] [place], [place] [address]    Culture   Final    NO GROWTH Performed at [facility], [place] [address], [place], [place] [address]    Report Status 07/08/2020 FINAL  Final     Labs: BNP (last 3 results) Recent Labs    06/08/20 0915 06/09/20 0600 07/07/20 0603  BNP 343.3* 397.7* 580.9*   Basic Metabolic Panel: Recent Labs  Lab 07/07/20 0603 07/08/20 0540 07/09/20 0420  NA 139 141 136  K 2.7* 2.7* 3.3*  CL  100 99 101  CO2 $Re'27 29 28  'WVN$ GLUCOSE 92  206* 334*  BUN 77* 72* 72*  CREATININE 2.90* 2.63* 2.69*  CALCIUM 8.9 8.7* 8.6*  MG 2.0  --  2.1   Liver Function Tests: Recent Labs  Lab 07/07/20 0603  AST 40  ALT 30  ALKPHOS 78  BILITOT 0.9  PROT 6.5  ALBUMIN 2.9*   No results for input(s): LIPASE, AMYLASE in the last 168 hours. Recent Labs  Lab 07/06/20 1126 07/07/20 0603  AMMONIA 102* 189*   CBC: Recent Labs  Lab 07/07/20 0603 07/08/20 0540 07/09/20 0420  WBC 4.5 4.6 5.0  NEUTROABS 3.7  --   --   HGB 8.0* 7.3* 7.0*  HCT 22.3* 21.7* 21.3*  MCV 87.8 91.6 94.2  PLT 69* 72* 67*   Cardiac Enzymes: No results for input(s): CKTOTAL, CKMB, CKMBINDEX, TROPONINI in the last 168 hours. BNP: Invalid input(s): POCBNP CBG: Recent Labs  Lab 07/08/20 1151 07/08/20 1602 07/08/20 1932 07/09/20 0752 07/09/20 1124  GLUCAP 233* 279* 318* 295* 290*   D-Dimer No results for input(s): DDIMER in the last 72 hours. Hgb A1c No results for input(s): HGBA1C in the last 72 hours. Lipid Profile No results for input(s): CHOL, HDL, LDLCALC, TRIG, CHOLHDL, LDLDIRECT in the last 72 hours. Thyroid function studies Recent Labs    07/07/20 0603  TSH 2.116   Anemia work up No results for input(s): VITAMINB12, FOLATE, FERRITIN, TIBC, IRON, RETICCTPCT in the last 72 hours. Urinalysis    Component Value Date/Time   COLORURINE STRAW (A) 07/07/2020 1035   APPEARANCEUR CLEAR (A) 07/07/2020 1035   APPEARANCEUR Clear 09/10/2014 0456   LABSPEC 1.008 07/07/2020 1035   LABSPEC 1.016 09/10/2014 0456   PHURINE 6.0 07/07/2020 1035   GLUCOSEU NEGATIVE 07/07/2020 1035   GLUCOSEU Negative 09/10/2014 0456   HGBUR NEGATIVE 07/07/2020 1035   BILIRUBINUR NEGATIVE 07/07/2020 1035   BILIRUBINUR Negative 09/10/2014 0456   KETONESUR NEGATIVE 07/07/2020 1035   PROTEINUR 30 (A) 07/07/2020 1035   NITRITE NEGATIVE 07/07/2020 1035   LEUKOCYTESUR NEGATIVE 07/07/2020 1035   LEUKOCYTESUR Negative 09/10/2014 0456    Sepsis Labs Invalid input(s): PROCALCITONIN,  WBC,  LACTICIDVEN Microbiology Recent Results (from the past 240 hour(s))  Culture, blood (routine x 2)     Status: None (Preliminary result)   Collection Time: 07/07/20  6:03 AM   Specimen: BLOOD  Result Value Ref Range Status   Specimen Description BLOOD BLOOD RIGHT HAND  Final   Special Requests   Final    BOTTLES DRAWN AEROBIC AND ANAEROBIC Blood Culture adequate volume   Culture   Final    NO GROWTH < 24 HOURS Performed at [facility], [address] [place], [place] [address]    Report Status PENDING  Incomplete  Culture, blood (routine x 2)     Status: None (Preliminary result)   Collection Time: 07/07/20  6:03 AM   Specimen: BLOOD  Result Value Ref Range Status   Specimen Description BLOOD BLOOD LEFT ARM  Final   Special Requests   Final    BOTTLES DRAWN AEROBIC AND ANAEROBIC Blood Culture adequate volume   Culture   Final    NO GROWTH < 24 HOURS Performed at [facility], [place] [place], [place] [address]    Report Status PENDING  Incomplete  Respiratory Panel by RT PCR (Flu A&B, Covid) - Nasopharyngeal Swab     Status: None   Collection Time: 07/07/20  6:03 AM   Specimen: Nasopharyngeal Swab  Result Value Ref Range Status   SARS Coronavirus  2 by RT PCR NEGATIVE NEGATIVE Final    Comment: (NOTE) SARS-CoV-2 target nucleic acids are NOT DETECTED.  The SARS-CoV-2 RNA is generally detectable in upper respiratoy specimens during the acute phase of infection. The lowest concentration of SARS-CoV-2 viral copies this assay can detect is 131 copies/mL. A negative result does not preclude SARS-Cov-2 infection and should not be used as the sole basis for treatment or other patient management decisions. A negative result may occur with  improper specimen collection/handling, submission of specimen other than nasopharyngeal swab, presence of viral mutation(s) within the areas targeted by this  assay, and inadequate number of viral copies (<131 copies/mL). A negative result must be combined with clinical observations, patient history, and epidemiological information. The expected result is Negative.  Fact Sheet for Patients:  PinkCheek.be  Fact Sheet for Healthcare Providers:  GravelBags.it  This test is no t yet approved or cleared by the [place] FDA and  has been authorized for detection and/or diagnosis of SARS-CoV-2 by FDA under an Emergency Use Authorization (EUA). This EUA will remain  in effect (meaning this test can be used) for the duration of the COVID-19 declaration under Section 564(b)(1) of the Act, 21 U.S.C. section 360bbb-3(b)(1), unless the authorization is terminated or revoked sooner.     Influenza A by PCR NEGATIVE NEGATIVE Final   Influenza B by PCR NEGATIVE NEGATIVE Final    Comment: (NOTE) The Xpert Xpress SARS-CoV-2/FLU/RSV assay is intended as an aid in  the diagnosis of influenza from Nasopharyngeal swab specimens and  should not be used as a sole basis for treatment. Nasal washings and  aspirates are unacceptable for Xpert Xpress SARS-CoV-2/FLU/RSV  testing.  Fact Sheet for Patients: PinkCheek.be  Fact Sheet for Healthcare Providers: GravelBags.it  This test is not yet approved or cleared by the [place] FDA and  has been authorized for detection and/or diagnosis of SARS-CoV-2 by  FDA under an Emergency Use Authorization (EUA). This EUA will remain  in effect (meaning this test can be used) for the duration of the  Covid-19 declaration under Section 564(b)(1) of the Act, 21  U.S.C. section 360bbb-3(b)(1), unless the authorization is  terminated or revoked. Performed at [facility], [address] [place], [place] [address]   Urine culture     Status: None   Collection Time: 07/07/20 10:35 AM    Specimen: Urine, Random  Result Value Ref Range Status   Specimen Description   Final    URINE, RANDOM Performed at [facility], [address], [place], [name] [address]    Special Requests   Final    NONE Performed at [facility], [address], [place], [place] [address]    Culture   Final    NO GROWTH Performed at [facility], [place] [address], [place], [place] [address]    Report Status 07/08/2020 FINAL  Final     Time coordinating discharge: Over 30 minutes  SIGNED:   Ezekiel Slocumb, DO [facility] 07/09/2020, 3:51 PM   If 7PM-7AM, please contact night-coverage [email or web address]

## 2020-07-10 LAB — BPAM RBC
Blood Product Expiration Date: 202110112359
ISSUE DATE / TIME: 202110091433
Unit Type and Rh: 9500

## 2020-07-10 LAB — TYPE AND SCREEN
ABO/RH(D): A NEG
Antibody Screen: NEGATIVE
Unit division: 0

## 2020-07-12 LAB — CULTURE, BLOOD (ROUTINE X 2)
Culture: NO GROWTH
Culture: NO GROWTH
Special Requests: ADEQUATE
Special Requests: ADEQUATE

## 2020-07-14 ENCOUNTER — Other Ambulatory Visit (HOSPITAL_COMMUNITY): Payer: Self-pay | Admitting: Internal Medicine

## 2020-07-14 ENCOUNTER — Other Ambulatory Visit: Payer: Self-pay | Admitting: Internal Medicine

## 2020-07-14 DIAGNOSIS — R188 Other ascites: Secondary | ICD-10-CM

## 2020-07-14 DIAGNOSIS — K7460 Unspecified cirrhosis of liver: Secondary | ICD-10-CM

## 2020-07-15 ENCOUNTER — Encounter: Payer: Self-pay | Admitting: Internal Medicine

## 2020-07-15 ENCOUNTER — Other Ambulatory Visit: Payer: Self-pay

## 2020-07-15 ENCOUNTER — Other Ambulatory Visit
Admission: RE | Admit: 2020-07-15 | Discharge: 2020-07-15 | Disposition: A | Discharge status: Home / Self Care | Payer: Medicare Other | Source: Ambulatory Visit | Attending: Internal Medicine | Admitting: Internal Medicine

## 2020-07-15 DIAGNOSIS — Z01812 Encounter for preprocedural laboratory examination: Secondary | ICD-10-CM | POA: Insufficient documentation

## 2020-07-15 DIAGNOSIS — Z20822 Contact with and (suspected) exposure to covid-19: Secondary | ICD-10-CM | POA: Diagnosis not present

## 2020-07-15 LAB — SARS CORONAVIRUS 2 (TAT 6-24 HRS): SARS Coronavirus 2: NEGATIVE

## 2020-07-18 ENCOUNTER — Other Ambulatory Visit: Payer: Self-pay

## 2020-07-18 ENCOUNTER — Encounter
Admission: RE | Disposition: A | Discharge status: Home / Self Care | Payer: Self-pay | Source: Home / Self Care | Attending: Internal Medicine

## 2020-07-18 ENCOUNTER — Ambulatory Visit: Payer: Medicare Other | Admitting: Certified Registered Nurse Anesthetist

## 2020-07-18 ENCOUNTER — Ambulatory Visit
Admission: RE | Admit: 2020-07-18 | Discharge: 2020-07-18 | Disposition: A | Discharge status: Home / Self Care | Payer: Medicare Other | Attending: Internal Medicine | Admitting: Internal Medicine

## 2020-07-18 DIAGNOSIS — Z8719 Personal history of other diseases of the digestive system: Secondary | ICD-10-CM | POA: Insufficient documentation

## 2020-07-18 DIAGNOSIS — K31811 Angiodysplasia of stomach and duodenum with bleeding: Secondary | ICD-10-CM | POA: Diagnosis not present

## 2020-07-18 DIAGNOSIS — E785 Hyperlipidemia, unspecified: Secondary | ICD-10-CM | POA: Diagnosis not present

## 2020-07-18 DIAGNOSIS — K766 Portal hypertension: Secondary | ICD-10-CM | POA: Insufficient documentation

## 2020-07-18 DIAGNOSIS — K7460 Unspecified cirrhosis of liver: Secondary | ICD-10-CM | POA: Insufficient documentation

## 2020-07-18 DIAGNOSIS — I130 Hypertensive heart and chronic kidney disease with heart failure and stage 1 through stage 4 chronic kidney disease, or unspecified chronic kidney disease: Secondary | ICD-10-CM | POA: Insufficient documentation

## 2020-07-18 DIAGNOSIS — G4730 Sleep apnea, unspecified: Secondary | ICD-10-CM | POA: Diagnosis not present

## 2020-07-18 DIAGNOSIS — J449 Chronic obstructive pulmonary disease, unspecified: Secondary | ICD-10-CM | POA: Diagnosis not present

## 2020-07-18 DIAGNOSIS — M1990 Unspecified osteoarthritis, unspecified site: Secondary | ICD-10-CM | POA: Insufficient documentation

## 2020-07-18 DIAGNOSIS — I4891 Unspecified atrial fibrillation: Secondary | ICD-10-CM | POA: Diagnosis not present

## 2020-07-18 DIAGNOSIS — F419 Anxiety disorder, unspecified: Secondary | ICD-10-CM | POA: Diagnosis not present

## 2020-07-18 DIAGNOSIS — Z79899 Other long term (current) drug therapy: Secondary | ICD-10-CM | POA: Insufficient documentation

## 2020-07-18 DIAGNOSIS — E1022 Type 1 diabetes mellitus with diabetic chronic kidney disease: Secondary | ICD-10-CM | POA: Diagnosis not present

## 2020-07-18 DIAGNOSIS — D500 Iron deficiency anemia secondary to blood loss (chronic): Secondary | ICD-10-CM | POA: Diagnosis present

## 2020-07-18 DIAGNOSIS — K219 Gastro-esophageal reflux disease without esophagitis: Secondary | ICD-10-CM | POA: Insufficient documentation

## 2020-07-18 DIAGNOSIS — I509 Heart failure, unspecified: Secondary | ICD-10-CM | POA: Diagnosis not present

## 2020-07-18 DIAGNOSIS — Z794 Long term (current) use of insulin: Secondary | ICD-10-CM | POA: Insufficient documentation

## 2020-07-18 DIAGNOSIS — D631 Anemia in chronic kidney disease: Secondary | ICD-10-CM | POA: Diagnosis not present

## 2020-07-18 DIAGNOSIS — K921 Melena: Secondary | ICD-10-CM | POA: Diagnosis present

## 2020-07-18 DIAGNOSIS — E1021 Type 1 diabetes mellitus with diabetic nephropathy: Secondary | ICD-10-CM | POA: Insufficient documentation

## 2020-07-18 DIAGNOSIS — K2270 Barrett's esophagus without dysplasia: Secondary | ICD-10-CM | POA: Insufficient documentation

## 2020-07-18 DIAGNOSIS — N189 Chronic kidney disease, unspecified: Secondary | ICD-10-CM | POA: Insufficient documentation

## 2020-07-18 DIAGNOSIS — K449 Diaphragmatic hernia without obstruction or gangrene: Secondary | ICD-10-CM | POA: Insufficient documentation

## 2020-07-18 HISTORY — DX: Hepatic encephalopathy: K76.82

## 2020-07-18 HISTORY — DX: Hepatic failure, unspecified without coma: K72.90

## 2020-07-18 HISTORY — DX: Neoplasm of unspecified behavior of digestive system: D49.0

## 2020-07-18 HISTORY — PX: ESOPHAGOGASTRODUODENOSCOPY (EGD) WITH PROPOFOL: SHX5813

## 2020-07-18 HISTORY — DX: Unspecified atrial fibrillation: I48.91

## 2020-07-18 HISTORY — DX: Other diseases of stomach and duodenum: K31.89

## 2020-07-18 LAB — GLUCOSE, CAPILLARY: Glucose-Capillary: 217 mg/dL — ABNORMAL HIGH (ref 70–99)

## 2020-07-18 SURGERY — ESOPHAGOGASTRODUODENOSCOPY (EGD) WITH PROPOFOL
Anesthesia: General

## 2020-07-18 MED ORDER — PROPOFOL 10 MG/ML IV BOLUS
INTRAVENOUS | Status: DC | PRN
  Administered 2020-07-18: 30 mg via INTRAVENOUS
  Administered 2020-07-18: 20 mg via INTRAVENOUS
  Administered 2020-07-18: 30 mg via INTRAVENOUS
  Administered 2020-07-18: 50 mg via INTRAVENOUS

## 2020-07-18 MED ORDER — PROPOFOL 10 MG/ML IV BOLUS
INTRAVENOUS | Status: AC
  Filled 2020-07-18: qty 80

## 2020-07-18 MED ORDER — SODIUM CHLORIDE 0.9 % IV SOLN
INTRAVENOUS | Status: DC
  Administered 2020-07-18: 20 mL/h via INTRAVENOUS

## 2020-07-18 MED ORDER — LIDOCAINE HCL (CARDIAC) PF 100 MG/5ML IV SOSY
PREFILLED_SYRINGE | INTRAVENOUS | Status: DC | PRN
  Administered 2020-07-18: 60 mg via INTRAVENOUS

## 2020-07-18 NOTE — Op Note (Signed)
[facility] Gastroenterology Patient Name: Corey West Procedure Date: 07/18/2020 7:40 AM MRN: [id number] Account #: [id number] Date of Birth: 1951/04/01 Admit Type: Outpatient Age: 69 Room: [facility] ENDO ROOM 3 Gender: Male Note Status: Finalized Procedure:             Upper GI endoscopy Indications:           Iron deficiency anemia secondary to chronic blood                         loss, Melena, Portal venous hypertension Providers:             Benay Pike. [name] MD, MD Referring MD:          Andres Labrum, MD (Referring MD) Medicines:             Propofol per Anesthesia Complications:         No immediate complications. Estimated blood loss:                         Minimal. Procedure:             Pre-Anesthesia Assessment:                        - The risks and benefits of the procedure and the                         sedation options and risks were discussed with the                         patient. All questions were answered and informed                         consent was obtained.                        - Patient identification and proposed procedure were                         verified prior to the procedure by the nurse. The                         procedure was verified in the procedure room.                        - ASA Grade Assessment: III - A patient with severe                         systemic disease.                        - After reviewing the risks and benefits, the patient                         was deemed in satisfactory condition to undergo the                         procedure.                        After obtaining informed consent, the  endoscope was                         passed under direct vision. Throughout the procedure,                         the patient's blood pressure, pulse, and oxygen                         saturations were monitored continuously. The Endoscope                         was introduced through the mouth, and  advanced to the                         third part of duodenum. The upper GI endoscopy was                         accomplished without difficulty. The patient tolerated                         the procedure well. Findings:      There were esophageal mucosal changes suspicious for long-segment       Barrett's esophagus present in the lower third of the esophagus. The       maximum longitudinal extent of these mucosal changes was 7 cm in length.       Mucosa was biopsied with a cold forceps for histology. One specimen       bottle was sent to pathology.      There is no endoscopic evidence of varices in the lower third of the       esophagus.      Moderate portal hypertensive gastropathy was found in the entire       examined stomach.      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 Coagulation for hemostasis using argon plasma at 0.8       liters/minute and 30 watts was successful. Estimated blood loss was       minimal.      There is no endoscopic evidence of varices in the cardia and in the       gastric fundus.      The examined duodenum was normal.      The exam was otherwise without abnormality. Impression:            - Esophageal mucosal changes suspicious for                         long-segment Barrett's esophagus. Biopsied.                        - Portal hypertensive gastropathy.                        - Gastric antral vascular ectasia with bleeding.                         Treated with argon plasma coagulation (APC).                        -  Normal examined duodenum.                        - The examination was otherwise normal. Recommendation:        - Patient has a contact number available for                         emergencies. The signs and symptoms of potential                         delayed complications were discussed with the patient.                         Return to normal activities tomorrow. Written                          discharge instructions were provided to the patient.                        - Resume previous diet.                        - Continue present medications.                        - Await pathology results.                        - Repeat upper endoscopy in 3 months for retreatment.                        - Return to GI office in 4 weeks.                        - Return to my office in 4 weeks. Procedure Code(s):     --- Professional ---                        35361, 24, Esophagogastroduodenoscopy, flexible,                         transoral; with control of bleeding, any method                        43239, Esophagogastroduodenoscopy, flexible,                         transoral; with biopsy, single or multiple Diagnosis Code(s):     --- Professional ---                        K92.1, Melena (includes Hematochezia)                        D50.0, Iron deficiency anemia secondary to blood loss                         (chronic)                        K31.811, Angiodysplasia of stomach and duodenum with  bleeding                        K31.89, Other diseases of stomach and duodenum                        K76.6, Portal hypertension                        K22.8, Other specified diseases of esophagus CPT copyright 2019 American Medical Association. All rights reserved. The codes documented in this report are preliminary and upon coder review may  be revised to meet current compliance requirements. Efrain Sella MD, MD 07/18/2020 9:12:28 AM This report has been signed electronically. Number of Addenda: 0 Note Initiated On: 07/18/2020 7:40 AM Estimated Blood Loss:  Estimated blood loss was minimal.      [facility]

## 2020-07-18 NOTE — Anesthesia Preprocedure Evaluation (Signed)
Anesthesia Evaluation  Patient identified by MRN, date of birth, ID band Patient awake    Reviewed: Allergy & Precautions, H&P , NPO status , Patient's Chart, lab work & pertinent test results, reviewed documented beta blocker date and time   Airway Mallampati: II   Neck ROM: full    Dental  (+) Teeth Intact   Pulmonary sleep apnea , pneumonia, resolved, COPD, Patient abstained from smoking., former smoker,    Pulmonary exam normal        Cardiovascular Exercise Tolerance: Poor hypertension, On Medications +CHF  Normal cardiovascular exam+ dysrhythmias  Rhythm:regular Rate:Normal     Neuro/Psych PSYCHIATRIC DISORDERS Anxiety Depression negative neurological ROS     GI/Hepatic Neg liver ROS, hiatal hernia, GERD  Medicated,  Endo/Other  negative endocrine ROSdiabetes, Well Controlled, Type 1, Insulin Dependent  Renal/GU Renal disease  negative genitourinary   Musculoskeletal   Abdominal   Peds  Hematology  (+) Blood dyscrasia, anemia ,   Anesthesia Other Findings Past Medical History: No date: A-fib ([place]) No date: Anemia No date: Anxiety     Comment:  controlled;  No date: Arthritis No date: AVM (arteriovenous malformation) of stomach, acquired with  hemorrhage No date: Barrett's esophagus No date: CHF (congestive heart failure) (HCC) No date: Chronic kidney disease     Comment:  renal infufficiency No date: Cirrhosis ([place]) No date: COPD (chronic obstructive pulmonary disease) ([place]) No date: Depression     Comment:  controlled;  No date: Diabetes mellitus without complication (HCC)     Comment:  not controlled, taking insulin but sugar continues to               run high;  No date: Dysrhythmia No date: Edema No date: Esophageal varices (HCC) No date: GAVE (gastric antral vascular ectasia) No date: GERD (gastroesophageal reflux disease) No date: Hepatic encephalopathy (HCC) No date: History of hiatal  hernia No date: Hyperlipidemia No date: Hypertension     Comment:  controlled well;  No date: IPMN (intraductal papillary mucinous neoplasm) No date: Liver cirrhosis (HCC) No date: Nephropathy, 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[place]) No date: Obesity No date: Pancytopenia ([place]) No date: Polyp, stomach     Comment:  with chronic blood loss No date: Portal hypertensive gastropathy (HCC) No date: Sleep apnea     Comment:  does not wear a cpap, Medicare would not pay for it  No date: Venous stasis dermatitis of both lower extremities No date: Venous stasis of both lower extremities     Comment:  with cellulitis Past Surgical History: 02/09/2015: ESOPHAGOGASTRODUODENOSCOPY; N/A     Comment:  Procedure: ESOPHAGOGASTRODUODENOSCOPY (EGD);  Surgeon:               Manya Silvas, MD;  Location: [facility] ENDOSCOPY;                Service: Endoscopy;  Laterality: N/A; 07/22/2015: ESOPHAGOGASTRODUODENOSCOPY; N/A     Comment:  Procedure: ESOPHAGOGASTRODUODENOSCOPY (EGD);  Surgeon:               Manya Silvas, MD;  Location: [facility] ENDOSCOPY;                Service: Endoscopy;  Laterality: N/A; 06/26/2017: ESOPHAGOGASTRODUODENOSCOPY; N/A     Comment:  Procedure: ESOPHAGOGASTRODUODENOSCOPY (EGD);  Surgeon:               Manya Silvas, MD;  Location: [facility] ENDOSCOPY;                Service: Endoscopy;  Laterality: N/A; 06/27/2017: ESOPHAGOGASTRODUODENOSCOPY; N/A     Comment:  Procedure: ESOPHAGOGASTRODUODENOSCOPY (EGD);  Surgeon:               Manya Silvas, MD;  Location: [facility] ENDOSCOPY;                Service: Endoscopy;  Laterality: N/A; 06/28/2017: ESOPHAGOGASTRODUODENOSCOPY; N/A     Comment:  Procedure: ESOPHAGOGASTRODUODENOSCOPY (EGD);  Surgeon:               Manya Silvas, MD;  Location: [facility] ENDOSCOPY;                Service: Endoscopy;  Laterality: N/A; 10/11/2017: ESOPHAGOGASTRODUODENOSCOPY; Bilateral     Comment:  Procedure: ESOPHAGOGASTRODUODENOSCOPY (EGD);  Surgeon:               Manya Silvas,  MD;  Location: [facility] ENDOSCOPY;                Service: Endoscopy;  Laterality: Bilateral; 12/04/2017: ESOPHAGOGASTRODUODENOSCOPY; N/A     Comment:  Procedure: ESOPHAGOGASTRODUODENOSCOPY (EGD);  Surgeon:               Manya Silvas, MD;  Location: [facility] ENDOSCOPY;                Service: Endoscopy;  Laterality: N/A; 03/06/2019: ESOPHAGOGASTRODUODENOSCOPY; N/A     Comment:  Procedure: ESOPHAGOGASTRODUODENOSCOPY (EGD);  Surgeon:               [place], Benay Pike, MD;  Location: [facility] ENDOSCOPY;                Service: Gastroenterology;  Laterality: N/A; 07/20/2015: ESOPHAGOGASTRODUODENOSCOPY (EGD) WITH PROPOFOL; N/A     Comment:  Procedure: ESOPHAGOGASTRODUODENOSCOPY (EGD) WITH               PROPOFOL;  Surgeon: Manya Silvas, MD;  Location: [facility]              ENDOSCOPY;  Service: Endoscopy;  Laterality: N/A; 09/16/2015: ESOPHAGOGASTRODUODENOSCOPY (EGD) WITH PROPOFOL; N/A     Comment:  Procedure: ESOPHAGOGASTRODUODENOSCOPY (EGD) WITH               PROPOFOL;  Surgeon: Manya Silvas, MD;  Location: [facility]              ENDOSCOPY;  Service: Endoscopy;  Laterality: N/A; 03/16/2016: ESOPHAGOGASTRODUODENOSCOPY (EGD) WITH PROPOFOL; N/A     Comment:  Procedure: ESOPHAGOGASTRODUODENOSCOPY (EGD) WITH               PROPOFOL;  Surgeon: Manya Silvas, MD;  Location: [facility]              ENDOSCOPY;  Service: Endoscopy;  Laterality: N/A; 09/14/2016: ESOPHAGOGASTRODUODENOSCOPY (EGD) WITH PROPOFOL; N/A     Comment:  Procedure: ESOPHAGOGASTRODUODENOSCOPY (EGD) WITH               PROPOFOL;  Surgeon: Manya Silvas, MD;  Location: [facility]              ENDOSCOPY;  Service: Endoscopy;  Laterality: N/A; 11/05/2016: ESOPHAGOGASTRODUODENOSCOPY (EGD) WITH PROPOFOL; N/A     Comment:  Procedure: ESOPHAGOGASTRODUODENOSCOPY (EGD) WITH               PROPOFOL;  Surgeon: Manya Silvas, MD;  Location: [facility]              ENDOSCOPY;  Service: Endoscopy;  Laterality: N/A; 02/06/2017: ESOPHAGOGASTRODUODENOSCOPY (EGD) WITH PROPOFOL;  N/A     Comment:  Procedure: ESOPHAGOGASTRODUODENOSCOPY (EGD) WITH  PROPOFOL;  Surgeon: Manya Silvas, MD;  Location:               [facility] ENDOSCOPY;  Service: Endoscopy;  Laterality: N/A; 04/24/2017: ESOPHAGOGASTRODUODENOSCOPY (EGD) WITH PROPOFOL; N/A     Comment:  Procedure: ESOPHAGOGASTRODUODENOSCOPY (EGD) WITH               PROPOFOL;  Surgeon: Manya Silvas, MD;  Location:               [facility] ENDOSCOPY;  Service: Endoscopy;  Laterality: N/A; 07/31/2017: ESOPHAGOGASTRODUODENOSCOPY (EGD) WITH PROPOFOL; N/A     Comment:  Procedure: ESOPHAGOGASTRODUODENOSCOPY (EGD) WITH               PROPOFOL;  Surgeon: Manya Silvas, MD;  Location:               [facility] ENDOSCOPY;  Service: Endoscopy;  Laterality: N/A; 08/08/2018: ESOPHAGOGASTRODUODENOSCOPY (EGD) WITH PROPOFOL; N/A     Comment:  Procedure: ESOPHAGOGASTRODUODENOSCOPY (EGD) WITH               PROPOFOL;  Surgeon: Manya Silvas, MD;  Location:               [facility] ENDOSCOPY;  Service: Endoscopy;  Laterality: N/A; 05/21/2020: ESOPHAGOGASTRODUODENOSCOPY (EGD) WITH PROPOFOL; N/A     Comment:  Procedure: ESOPHAGOGASTRODUODENOSCOPY (EGD) WITH               PROPOFOL;  Surgeon: [name] Manifold, MD;  Location:               [facility] ENDOSCOPY;  Service: Endoscopy;  Laterality: N/A; 12/05/2017: GIVENS CAPSULE STUDY; N/A     Comment:  Procedure: GIVENS CAPSULE STUDY;  Surgeon: Manya Silvas, MD;  Location: [facility] ENDOSCOPY;  Service:               Endoscopy;  Laterality: N/A; No date: TONSILLECTOMY No date: TONSILLECTOMY AND ADENOIDECTOMY No date: ULNAR NERVE TRANSPOSITION No date: UVULOPALATOPHARYNGOPLASTY BMI    Body Mass Index: 42.83 kg/m     Reproductive/Obstetrics negative OB ROS                             Anesthesia Physical Anesthesia Plan  ASA: IV  Anesthesia Plan: General   Post-op Pain Management:    Induction:   PONV Risk Score and Plan:   Airway Management  Planned:   Additional Equipment:   Intra-op Plan:   Post-operative Plan:   Informed Consent: I have reviewed the patients History and Physical, chart, labs and discussed the procedure including the risks, benefits and alternatives for the proposed anesthesia with the patient or authorized representative who has indicated his/her understanding and acceptance.     Dental Advisory Given  Plan Discussed with: CRNA  Anesthesia Plan Comments:         Anesthesia Quick Evaluation

## 2020-07-18 NOTE — H&P (Signed)
Outpatient short stay form Pre-procedure 07/18/2020 8:50 AM Corey West K. [name], M.D.  Primary Physician: Adrian Prows, M.D.  Reason for visit:  Esophageal varices, GAVE, melena, anemia.  History of present illness:  69 y/o male with decompensated liver disease presents for EGD. He has weakness, intermittent melena. No pain.Hx esophageal varices    Current Facility-Administered Medications:  .  0.9 %  sodium chloride infusion, , Intravenous, Continuous, [place], Benay Pike, MD, Last Rate: 20 mL/hr at 07/18/20 0849, Continued from Pre-op at 07/18/20 0849  Medications Prior to Admission  Medication Sig Dispense Refill Last Dose  . atorvastatin (LIPITOR) 10 MG tablet Take 20 mg by mouth every evening.    07/17/2020 at Unknown time  . ferrous sulfate 325 (65 FE) MG tablet Take 1 tablet (325 mg total) by mouth daily. This medication can cause black stools 60 tablet 1 07/17/2020 at Unknown time  . hydrALAZINE (APRESOLINE) 50 MG tablet Take 1 tablet (50 mg total) by mouth 2 (two) times daily. 60 tablet 1 07/17/2020 at Unknown time  . Ipratropium-Albuterol (COMBIVENT RESPIMAT) 20-100 MCG/ACT AERS respimat Inhale 1 puff into the lungs every 6 (six) hours. (Patient taking differently: Inhale 1 puff into the lungs every 6 (six) hours as needed. ) 4 g 0 07/17/2020 at Unknown time  . lactulose (CHRONULAC) 10 GM/15ML solution Take 90 mLs (60 g total) by mouth daily. (Patient taking differently: Take 45 g by mouth 2 (two) times daily. ) 236 mL 0 07/17/2020 at Unknown time  . LORazepam (ATIVAN) 0.5 MG tablet Take 1 tablet (0.5 mg total) by mouth 3 (three) times daily as needed for Anxiety   07/17/2020 at Unknown time  . metolazone (ZAROXOLYN) 5 MG tablet Take 5 mg by mouth daily. .   07/17/2020 at Unknown time  . NOVOLOG MIX 70/30 FLEXPEN (70-30) 100 UNIT/ML FlexPen 0-84 Units as directed. Inject 0 - 84 Units (sliding scale) Subcutaneously twice a day before meals, at breakfast and at dinner.   07/17/2020 at  Unknown time  . rifaximin (XIFAXAN) 550 MG TABS tablet Take 1 tablet (550 mg total) by mouth 2 (two) times daily. 60 tablet 0 07/17/2020 at Unknown time  . sodium chloride (OCEAN) 0.65 % SOLN nasal spray Place 1 spray into both nostrils as needed for congestion (nose irritation). 60 mL 0 07/17/2020 at Unknown time  . tamsulosin (FLOMAX) 0.4 MG CAPS capsule Take 1 capsule (0.4 mg total) by mouth daily after supper. 30 capsule 1 07/17/2020 at Unknown time  . torsemide (DEMADEX) 100 MG tablet Take 1 tablet (100 mg total) by mouth daily. 30 tablet 1 07/17/2020 at Unknown time  . pantoprazole (PROTONIX) 40 MG tablet Take 1 tablet (40 mg total) by mouth 2 (two) times daily. 60 tablet 11      No Known Allergies   Past Medical History:  Diagnosis Date  . A-fib ([place])   . Anemia   . Anxiety    controlled;   . Arthritis   . AVM (arteriovenous malformation) of stomach, acquired with hemorrhage   . Barrett's esophagus   . CHF (congestive heart failure) ([place])   . Chronic kidney disease    renal infufficiency  . Cirrhosis ([place])   . COPD (chronic obstructive pulmonary disease) ([place])   . Depression    controlled;   Marland Kitchen Diabetes mellitus without complication ([place])    not controlled, taking insulin but sugar continues to run high;   Marland Kitchen Dysrhythmia   . Edema   . Esophageal varices ([place])   .  GAVE (gastric antral vascular ectasia)   . GERD (gastroesophageal reflux disease)   . Hepatic encephalopathy ([place])   . History of hiatal hernia   . Hyperlipidemia   . Hypertension    controlled well;   . IPMN (intraductal papillary mucinous neoplasm)   . Liver cirrhosis ([place])   . 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 Obesity   . Pancytopenia ([place])   . Polyp, stomach    with chronic blood loss  . Portal hypertensive gastropathy ([place])   . Sleep apnea    does not wear a cpap, Medicare would not pay for it   . Venous stasis dermatitis of both lower extremities   . Venous stasis of both lower extremities    with  cellulitis    Review of systems:  Otherwise negative.    Physical Exam  Gen: Alert, oriented. Appears stated age.  HEENT: [place]/AT. PERRLA. Lungs: CTA, no wheezes. CV: RR nl S1, S2. Abd: soft, benign, no masses. BS+ Ext: No edema. Pulses 2+    Planned procedures: Proceed with EGD. The patient understands the nature of the planned procedure, indications, risks, alternatives and potential complications including but not limited to bleeding, infection, perforation, damage to internal organs and possible oversedation/side effects from anesthesia. The patient agrees and gives consent to proceed.  Please refer to procedure notes for findings, recommendations and patient disposition/instructions.     Ceyda Peterka K. [name], M.D. Gastroenterology 07/18/2020  8:50 AM

## 2020-07-18 NOTE — Transfer of Care (Signed)
Immediate Anesthesia Transfer of Care Note  Patient: Corey West  Procedure(s) Performed: ESOPHAGOGASTRODUODENOSCOPY (EGD) WITH PROPOFOL (N/A )  Patient Location: PACU and Endoscopy Unit  Anesthesia Type:General  Level of Consciousness: awake, drowsy and patient cooperative  Airway & Oxygen Therapy: Patient Spontanous Breathing  Post-op Assessment: Report given to RN and Post -op Vital signs reviewed and stable  Post vital signs: Reviewed and stable  Last Vitals:  Vitals Value Taken Time  BP 119/49 07/18/20 0911  Temp    Pulse 65 07/18/20 0914  Resp 15 07/18/20 0914  SpO2 98 % 07/18/20 0914  Vitals shown include unvalidated device data.  Last Pain:  Vitals:   07/18/20 0911  TempSrc:   PainSc: 0-No pain         Complications: No complications documented.

## 2020-07-18 NOTE — Interval H&P Note (Signed)
History and Physical Interval Note:  07/18/2020 8:52 AM  Corey West  has presented today for surgery, with the diagnosis of HX ESOPHAGEAL VARICES.  The various methods of treatment have been discussed with the patient and family. After consideration of risks, benefits and other options for treatment, the patient has consented to  Procedure(s): ESOPHAGOGASTRODUODENOSCOPY (EGD) WITH PROPOFOL (N/A) as a surgical intervention.  The patient's history has been reviewed, patient examined, no change in status, stable for surgery.  I have reviewed the patient's chart and labs.  Questions were answered to the patient's satisfaction.     [place], [place]

## 2020-07-19 ENCOUNTER — Encounter: Payer: Self-pay | Admitting: Internal Medicine

## 2020-07-19 LAB — SURGICAL PATHOLOGY

## 2020-07-19 NOTE — Anesthesia Postprocedure Evaluation (Signed)
Anesthesia Post Note  Patient: Corey West  Procedure(s) Performed: ESOPHAGOGASTRODUODENOSCOPY (EGD) WITH PROPOFOL (N/A )  Patient location during evaluation: PACU Anesthesia Type: General Level of consciousness: awake and alert Pain management: pain level controlled Vital Signs Assessment: post-procedure vital signs reviewed and stable Respiratory status: spontaneous breathing, nonlabored ventilation, respiratory function stable and patient connected to nasal cannula oxygen Cardiovascular status: blood pressure returned to baseline and stable Postop Assessment: no apparent nausea or vomiting Anesthetic complications: no   No complications documented.   Last Vitals:  Vitals:   07/18/20 0921 07/18/20 0931  BP: (!) 129/53 (!) 137/51  Pulse: 65   Resp: 14   Temp:    SpO2: 99%     Last Pain:  Vitals:   07/19/20 0750  TempSrc:   PainSc: 0-No pain                 Molli Barrows

## 2020-07-21 ENCOUNTER — Ambulatory Visit: Admission: RE | Admit: 2020-07-21 | Payer: Medicare Other | Source: Ambulatory Visit

## 2020-07-22 ENCOUNTER — Telehealth: Payer: Self-pay | Admitting: Family

## 2020-07-22 NOTE — Telephone Encounter (Signed)
Spoke to patient in attempt to reschedule a no show CHF Clinic appointment from September and patient stated he has a lot going on and can not reschedule at this time but as soon as able will give [place] a call back.   Corey West, NT

## 2020-07-25 ENCOUNTER — Other Ambulatory Visit: Payer: Self-pay | Admitting: *Deleted

## 2020-07-25 ENCOUNTER — Other Ambulatory Visit: Payer: Self-pay

## 2020-07-25 ENCOUNTER — Emergency Department: Payer: Medicare Other

## 2020-07-25 ENCOUNTER — Telehealth: Payer: Self-pay | Admitting: *Deleted

## 2020-07-25 ENCOUNTER — Inpatient Hospital Stay
Admission: EM | Admit: 2020-07-25 | Discharge: 2020-07-30 | DRG: 812 | Disposition: A | Discharge status: Home Health | Payer: Medicare Other | Attending: Internal Medicine | Admitting: Internal Medicine

## 2020-07-25 DIAGNOSIS — Z794 Long term (current) use of insulin: Secondary | ICD-10-CM

## 2020-07-25 DIAGNOSIS — D649 Anemia, unspecified: Secondary | ICD-10-CM | POA: Diagnosis present

## 2020-07-25 DIAGNOSIS — R188 Other ascites: Secondary | ICD-10-CM | POA: Diagnosis present

## 2020-07-25 DIAGNOSIS — E1121 Type 2 diabetes mellitus with diabetic nephropathy: Secondary | ICD-10-CM

## 2020-07-25 DIAGNOSIS — K7290 Hepatic failure, unspecified without coma: Secondary | ICD-10-CM | POA: Diagnosis present

## 2020-07-25 DIAGNOSIS — D500 Iron deficiency anemia secondary to blood loss (chronic): Secondary | ICD-10-CM

## 2020-07-25 DIAGNOSIS — D631 Anemia in chronic kidney disease: Secondary | ICD-10-CM | POA: Diagnosis present

## 2020-07-25 DIAGNOSIS — F411 Generalized anxiety disorder: Secondary | ICD-10-CM | POA: Diagnosis present

## 2020-07-25 DIAGNOSIS — K766 Portal hypertension: Secondary | ICD-10-CM | POA: Diagnosis present

## 2020-07-25 DIAGNOSIS — J449 Chronic obstructive pulmonary disease, unspecified: Secondary | ICD-10-CM | POA: Diagnosis present

## 2020-07-25 DIAGNOSIS — N179 Acute kidney failure, unspecified: Secondary | ICD-10-CM | POA: Diagnosis not present

## 2020-07-25 DIAGNOSIS — G4730 Sleep apnea, unspecified: Secondary | ICD-10-CM | POA: Diagnosis present

## 2020-07-25 DIAGNOSIS — I8510 Secondary esophageal varices without bleeding: Secondary | ICD-10-CM | POA: Diagnosis present

## 2020-07-25 DIAGNOSIS — F419 Anxiety disorder, unspecified: Secondary | ICD-10-CM | POA: Diagnosis present

## 2020-07-25 DIAGNOSIS — Q5564 Hidden penis: Secondary | ICD-10-CM

## 2020-07-25 DIAGNOSIS — E1122 Type 2 diabetes mellitus with diabetic chronic kidney disease: Secondary | ICD-10-CM | POA: Diagnosis present

## 2020-07-25 DIAGNOSIS — K7460 Unspecified cirrhosis of liver: Secondary | ICD-10-CM | POA: Diagnosis not present

## 2020-07-25 DIAGNOSIS — E876 Hypokalemia: Secondary | ICD-10-CM | POA: Diagnosis not present

## 2020-07-25 DIAGNOSIS — Z20822 Contact with and (suspected) exposure to covid-19: Secondary | ICD-10-CM | POA: Diagnosis present

## 2020-07-25 DIAGNOSIS — I4891 Unspecified atrial fibrillation: Secondary | ICD-10-CM | POA: Diagnosis present

## 2020-07-25 DIAGNOSIS — K219 Gastro-esophageal reflux disease without esophagitis: Secondary | ICD-10-CM | POA: Diagnosis present

## 2020-07-25 DIAGNOSIS — K922 Gastrointestinal hemorrhage, unspecified: Secondary | ICD-10-CM | POA: Diagnosis present

## 2020-07-25 DIAGNOSIS — R32 Unspecified urinary incontinence: Secondary | ICD-10-CM | POA: Diagnosis present

## 2020-07-25 DIAGNOSIS — M1990 Unspecified osteoarthritis, unspecified site: Secondary | ICD-10-CM | POA: Diagnosis present

## 2020-07-25 DIAGNOSIS — N189 Chronic kidney disease, unspecified: Secondary | ICD-10-CM

## 2020-07-25 DIAGNOSIS — K3189 Other diseases of stomach and duodenum: Secondary | ICD-10-CM | POA: Diagnosis present

## 2020-07-25 DIAGNOSIS — I130 Hypertensive heart and chronic kidney disease with heart failure and stage 1 through stage 4 chronic kidney disease, or unspecified chronic kidney disease: Secondary | ICD-10-CM | POA: Diagnosis present

## 2020-07-25 DIAGNOSIS — R531 Weakness: Secondary | ICD-10-CM

## 2020-07-25 DIAGNOSIS — L97929 Non-pressure chronic ulcer of unspecified part of left lower leg with unspecified severity: Secondary | ICD-10-CM | POA: Diagnosis present

## 2020-07-25 DIAGNOSIS — Z6841 Body Mass Index (BMI) 40.0 and over, adult: Secondary | ICD-10-CM

## 2020-07-25 DIAGNOSIS — I5032 Chronic diastolic (congestive) heart failure: Secondary | ICD-10-CM | POA: Diagnosis present

## 2020-07-25 DIAGNOSIS — E785 Hyperlipidemia, unspecified: Secondary | ICD-10-CM | POA: Diagnosis present

## 2020-07-25 DIAGNOSIS — N2581 Secondary hyperparathyroidism of renal origin: Secondary | ICD-10-CM | POA: Diagnosis present

## 2020-07-25 DIAGNOSIS — Z8249 Family history of ischemic heart disease and other diseases of the circulatory system: Secondary | ICD-10-CM

## 2020-07-25 DIAGNOSIS — K2270 Barrett's esophagus without dysplasia: Secondary | ICD-10-CM | POA: Diagnosis present

## 2020-07-25 DIAGNOSIS — N400 Enlarged prostate without lower urinary tract symptoms: Secondary | ICD-10-CM | POA: Diagnosis present

## 2020-07-25 DIAGNOSIS — K7581 Nonalcoholic steatohepatitis (NASH): Secondary | ICD-10-CM | POA: Diagnosis present

## 2020-07-25 DIAGNOSIS — Z87891 Personal history of nicotine dependence: Secondary | ICD-10-CM

## 2020-07-25 DIAGNOSIS — K31819 Angiodysplasia of stomach and duodenum without bleeding: Secondary | ICD-10-CM | POA: Diagnosis present

## 2020-07-25 DIAGNOSIS — Z823 Family history of stroke: Secondary | ICD-10-CM

## 2020-07-25 DIAGNOSIS — N184 Chronic kidney disease, stage 4 (severe): Secondary | ICD-10-CM | POA: Diagnosis present

## 2020-07-25 LAB — CBC
HCT: 20.7 % — ABNORMAL LOW (ref 39.0–52.0)
Hemoglobin: 6.8 g/dL — ABNORMAL LOW (ref 13.0–17.0)
MCH: 30.5 pg (ref 26.0–34.0)
MCHC: 32.9 g/dL (ref 30.0–36.0)
MCV: 92.8 fL (ref 80.0–100.0)
Platelets: 111 10*3/uL — ABNORMAL LOW (ref 150–400)
RBC: 2.23 MIL/uL — ABNORMAL LOW (ref 4.22–5.81)
RDW: 16.1 % — ABNORMAL HIGH (ref 11.5–15.5)
WBC: 5.5 10*3/uL (ref 4.0–10.5)
nRBC: 0 % (ref 0.0–0.2)

## 2020-07-25 LAB — COMPREHENSIVE METABOLIC PANEL
ALT: 25 U/L (ref 0–44)
AST: 35 U/L (ref 15–41)
Albumin: 2.7 g/dL — ABNORMAL LOW (ref 3.5–5.0)
Alkaline Phosphatase: 80 U/L (ref 38–126)
Anion gap: 12 (ref 5–15)
BUN: 113 mg/dL — ABNORMAL HIGH (ref 8–23)
CO2: 24 mmol/L (ref 22–32)
Calcium: 8.6 mg/dL — ABNORMAL LOW (ref 8.9–10.3)
Chloride: 101 mmol/L (ref 98–111)
Creatinine, Ser: 3.39 mg/dL — ABNORMAL HIGH (ref 0.61–1.24)
GFR, Estimated: 19 mL/min — ABNORMAL LOW (ref >60)
Glucose, Bld: 157 mg/dL — ABNORMAL HIGH (ref 70–99)
Potassium: 3.2 mmol/L — ABNORMAL LOW (ref 3.5–5.1)
Sodium: 137 mmol/L (ref 135–145)
Total Bilirubin: 0.8 mg/dL (ref 0.3–1.2)
Total Protein: 6.4 g/dL — ABNORMAL LOW (ref 6.5–8.1)

## 2020-07-25 LAB — RESPIRATORY PANEL BY RT PCR (FLU A&B, COVID)
Influenza A by PCR: NEGATIVE
Influenza B by PCR: NEGATIVE
SARS Coronavirus 2 by RT PCR: NEGATIVE

## 2020-07-25 LAB — PREPARE RBC (CROSSMATCH)

## 2020-07-25 MED ORDER — INSULIN ASPART 100 UNIT/ML ~~LOC~~ SOLN
0.0000 [IU] | Freq: Four times a day (QID) | SUBCUTANEOUS | Status: DC
  Administered 2020-07-26: 3 [IU] via SUBCUTANEOUS
  Administered 2020-07-26 – 2020-07-27 (×2): 2 [IU] via SUBCUTANEOUS
  Administered 2020-07-27 (×2): 3 [IU] via SUBCUTANEOUS
  Administered 2020-07-27: 12:00:00 2 [IU] via SUBCUTANEOUS
  Administered 2020-07-28: 3 [IU] via SUBCUTANEOUS
  Filled 2020-07-25 (×7): qty 1

## 2020-07-25 MED ORDER — PANTOPRAZOLE SODIUM 40 MG IV SOLR
40.0000 mg | Freq: Two times a day (BID) | INTRAVENOUS | Status: DC
  Administered 2020-07-26 – 2020-07-30 (×9): 40 mg via INTRAVENOUS
  Filled 2020-07-25 (×9): qty 40

## 2020-07-25 MED ORDER — PANTOPRAZOLE SODIUM 40 MG IV SOLR
40.0000 mg | Freq: Once | INTRAVENOUS | Status: AC
  Administered 2020-07-25: 40 mg via INTRAVENOUS
  Filled 2020-07-25: qty 40

## 2020-07-25 MED ORDER — SODIUM CHLORIDE 0.9 % IV SOLN
80.0000 mg | Freq: Once | INTRAVENOUS | Status: DC
  Filled 2020-07-25: qty 80

## 2020-07-25 MED ORDER — INSULIN DETEMIR 100 UNIT/ML ~~LOC~~ SOLN
5.0000 [IU] | Freq: Two times a day (BID) | SUBCUTANEOUS | Status: DC
  Administered 2020-07-26 – 2020-07-30 (×10): 5 [IU] via SUBCUTANEOUS
  Filled 2020-07-25 (×13): qty 0.05

## 2020-07-25 MED ORDER — LORAZEPAM 0.5 MG PO TABS
0.5000 mg | ORAL_TABLET | Freq: Once | ORAL | Status: AC
  Administered 2020-07-25: 0.5 mg via ORAL
  Filled 2020-07-25: qty 1

## 2020-07-25 MED ORDER — SODIUM CHLORIDE 0.9 % IV SOLN
8.0000 mg/h | INTRAVENOUS | Status: DC
  Filled 2020-07-25: qty 80

## 2020-07-25 MED ORDER — RIFAXIMIN 550 MG PO TABS
550.0000 mg | ORAL_TABLET | Freq: Two times a day (BID) | ORAL | Status: DC
  Administered 2020-07-26 – 2020-07-30 (×8): 550 mg via ORAL
  Filled 2020-07-25 (×9): qty 1

## 2020-07-25 MED ORDER — LORAZEPAM 0.5 MG PO TABS
0.5000 mg | ORAL_TABLET | Freq: Three times a day (TID) | ORAL | Status: DC | PRN
  Administered 2020-07-26 – 2020-07-30 (×6): 0.5 mg via ORAL
  Filled 2020-07-25 (×6): qty 1

## 2020-07-25 MED ORDER — SODIUM CHLORIDE 0.9 % IV SOLN: 10.0000 mL/h | Freq: Once | INTRAVENOUS | Status: DC

## 2020-07-25 NOTE — ED Triage Notes (Signed)
Pt sent over from [facility] by his PCP, Dr. Ola Spurr, for low Hg 6.2, states needs blood transfusion. Hx of liver disease, states he had recent procedure on Monday to repair 7 bleeding areas of liver. Hg remains low, pt cold and c/o sob. VSS in triage, warm blankets applied, 100% RA. Denies any cp, n/v/d or fevers

## 2020-07-25 NOTE — Telephone Encounter (Signed)
Message from Dr Blane Ohara office and patient requesting patient be scheduled for Monday a lab check and blood transfusion as his hgb is 6.8. Please advise

## 2020-07-25 NOTE — ED Notes (Signed)
Pt given crackers and drink per OK from EDP.

## 2020-07-25 NOTE — ED Notes (Signed)
Pt also reports he was recently taken off fluid pills 2 wks ago - BLE edema w/some weeping noted. Awaiting liver stability to restart meds per pt

## 2020-07-25 NOTE — H&P (Signed)
History and Physical    PLEASE NOTE THAT DRAGON DICTATION SOFTWARE WAS USED IN THE CONSTRUCTION OF THIS NOTE.   Corey West:[id number] DOB: 06/07/1951 DOA: 07/25/2020  PCP: Leonel Ramsay, MD Patient coming from: home   I have personally briefly reviewed patient's old medical records in [place]  Chief Complaint: Generalized weakness  HPI: Corey West is a 69 y.o. 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   The patient reports 4 to 5 days of generalized weakness in the absence of any acute focal weakness or change in sensation.  Denies any associated subjective fever, chills, rigors, or generalized myalgias.  Denies any recent headaches, neck stiffness, rhinitis, rhinorrhea, sore throat, or cough. Denies any recent dysuria, gross hematuria, or change in urinary urgency/frequency.   In the context of a history of cirrhosis as well as chronic diastolic heart failure, the patient reports baseline shortness of breath, without any significant recent worsening.  He notes chronic edema in the bilateral lower extremities, that he feels may possibly be slightly worse over the last few days, but has not noticed any significant change over that timeframe.  Denies any recent chest pain, palpitations, diaphoresis, or palpitations.   In the setting of stage IV chronic kidney disease with  baseline creatinine 2.7-2.9, the patient follows with Dr. Candiss Norse as his outpatient nephrologist.  The patient reports that while he is continue to take his metolazone and torsemide, that he was recently taken off of Lasix due to a recent trend up in his serum creatinine.   He also has a history of upper GI bleed, with most recent EGD performed by Dr. 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  Subsequent to this EGD, the patient denies  any recent abdominal pain, nausea, vomiting, hematemesis, melena, hematochezia, or diarrhea.  In the setting of chronic multifactorial anemia the patient has historically undergone intermittent blood transfusions, and he reports that the generalized weakness with which she presents this evening is similar to that which she has experienced at times that he has previously required blood 1 unit PRBC.  Not on any blood thinners as an outpatient.  Over the last 3 to 4 days he also reports mild lightheadedness and dizziness in the absence of any presyncope or syncope.  No recent trauma.  Denies any alcohol consumption over the last 15 years.    ED Course:  Vital signs in the ED were notable for the following: Temperature max 98.2; heart rate 63-74; blood pressure 119/42-120 6/59; respiratory rate 20-21; oxygen saturation 97 to 100% on room air.  Labs were notable for the following: CMP notable for the following: Sodium 137, bicarbonate 24, BUN 113 compared to 72 on 07/09/2020 and 101 on 06/06/2020, creatinine 3.39 relative to most recent prior value of 2.69 on 07/09/2020, glucose 157, albumin 2.7, and total bilirubin 0.8.  CBC notable for the following: White blood cell count of 5500, hemoglobin 6.8 with MCV 93 MCHC 32.9, and RDW 16.1; is relative to most  recent prior hemoglobin of 7.0 on 07/09/2020; CBC also notable for platelet count of 111 compared to most recent prior value of 67 on  07/09/2020.  Screening nasopharyngeal COVID-19 PCR as well as influenza PCR found to be negative in the ED this evening.  Chest x-ray, in comparison to most recent prior plain film of the chest from 07/07/2020 showed stable cardiomegaly and pulmonary vascular congestion without significant interval change.  The patient's case was discussed with the on-call gastroenterologist, Dr. Haig Prophet, who recommended slow transfusion of 1 unit PRBC and Protonix IV twice daily.  Dr. Haig Prophet will evaluate the patient in person and did not feel that Protonix drip, octreotide drip, or SBP prophylaxis was warranted at the present time, and did not feel that urgent overnight EGD was warranted at this juncture.   Case was also discussed with the on-call nephrologist, Dr. Pierce Crane, who agreed with slow transfusion of 1 unit PRBC given potential intravascular depletion in the setting of acute on chronic anemia diminished oxygen carrying capacity resulting in prerenal acute kidney injury superimposed on chronic kidney disease in spite of being total body water long.  Consequently, Dr. Pierce Crane recommended against overnight diuresis with repeat labs in the morning to evaluate interval trend in renal function and response to PRBC transfusion.   While in the ED, the following were administered: Protonix 40 mg IV x1.  Ativan 0.5 mg p.o. x1.  Additionally, the patient was typed and screened for 1 unit PRBC, with transfusion of such initiated slowly with plan to transfuse over 4 hours.    Review of Systems: As per HPI otherwise 10 point review of systems negative.   Past Medical History:  Diagnosis Date  . A-fib ([place])   . Anemia   . Anxiety    controlled;   . Arthritis   . AVM (arteriovenous malformation) of stomach, acquired with hemorrhage   . Barrett's esophagus   . CHF (congestive heart failure) ([place])   . Chronic kidney disease    renal infufficiency  . Cirrhosis ([place])   . COPD (chronic obstructive pulmonary disease)  ([place])   . Depression    controlled;   Marland Kitchen Diabetes mellitus without complication ([place])    not controlled, taking insulin but sugar continues to run high;   Marland Kitchen Dysrhythmia   . Edema   . Esophageal varices ([place])   . GAVE (gastric antral vascular ectasia)   . GERD (gastroesophageal reflux disease)   . Hepatic encephalopathy ([place])   . History of hiatal hernia   . Hyperlipidemia   . Hypertension    controlled well;   . IPMN (intraductal papillary mucinous neoplasm)   . Liver cirrhosis ([place])   . Nephropathy, diabetic ([place])   . Obesity   . Pancytopenia ([place])   . Polyp, stomach    with chronic blood loss  . Portal hypertensive gastropathy ([place])   . Sleep apnea    does not wear a cpap, Medicare would not pay for it   . Venous stasis dermatitis of both lower extremities   . Venous stasis of both lower extremities    with cellulitis    Past Surgical History:  Procedure Laterality Date  . ESOPHAGOGASTRODUODENOSCOPY N/A 02/09/2015   Procedure: ESOPHAGOGASTRODUODENOSCOPY (EGD);  Surgeon: Manya Silvas, MD;  Location: [facility] ENDOSCOPY;  Service: Endoscopy;  Laterality: N/A;  . ESOPHAGOGASTRODUODENOSCOPY N/A 07/22/2015   Procedure: ESOPHAGOGASTRODUODENOSCOPY (EGD);  Surgeon: Manya Silvas, MD;  Location[facility] ENDOSCOPY;  Service: Endoscopy;  Laterality: N/A;  . ESOPHAGOGASTRODUODENOSCOPY N/A 06/26/2017  Procedure: ESOPHAGOGASTRODUODENOSCOPY (EGD);  Surgeon: Manya Silvas, MD;  Location: [facility] ENDOSCOPY;  Service: Endoscopy;  Laterality: N/A;  . ESOPHAGOGASTRODUODENOSCOPY N/A 06/27/2017   Procedure: ESOPHAGOGASTRODUODENOSCOPY (EGD);  Surgeon: Manya Silvas, MD;  Location: [facility] ENDOSCOPY;  Service: Endoscopy;  Laterality: N/A;  . ESOPHAGOGASTRODUODENOSCOPY N/A 06/28/2017   Procedure: ESOPHAGOGASTRODUODENOSCOPY (EGD);  Surgeon: Manya Silvas, MD;  Location: [facility] ENDOSCOPY;  Service: Endoscopy;  Laterality: N/A;  . ESOPHAGOGASTRODUODENOSCOPY Bilateral 10/11/2017   Procedure:  ESOPHAGOGASTRODUODENOSCOPY (EGD);  Surgeon: Manya Silvas, MD;  Location: [facility] ENDOSCOPY;  Service: Endoscopy;  Laterality: Bilateral;  . ESOPHAGOGASTRODUODENOSCOPY N/A 12/04/2017   Procedure: ESOPHAGOGASTRODUODENOSCOPY (EGD);  Surgeon: Manya Silvas, MD;  Location[facility] ENDOSCOPY;  Service: Endoscopy;  Laterality: N/A;  . ESOPHAGOGASTRODUODENOSCOPY N/A 03/06/2019   Procedure: ESOPHAGOGASTRODUODENOSCOPY (EGD);  Surgeon: Toledo, Benay Pike, MD;  Location: [facility] ENDOSCOPY;  Service: Gastroenterology;  Laterality: N/A;  . ESOPHAGOGASTRODUODENOSCOPY (EGD) WITH PROPOFOL N/A 07/20/2015   Procedure: ESOPHAGOGASTRODUODENOSCOPY (EGD) WITH PROPOFOL;  Surgeon: Manya Silvas, MD;  Location: [facility] ENDOSCOPY;  Service: Endoscopy;  Laterality: N/A;  . ESOPHAGOGASTRODUODENOSCOPY (EGD) WITH PROPOFOL N/A 09/16/2015   Procedure: ESOPHAGOGASTRODUODENOSCOPY (EGD) WITH PROPOFOL;  Surgeon: Manya Silvas, MD;  Location: [facility] ENDOSCOPY;  Service: Endoscopy;  Laterality: N/A;  . ESOPHAGOGASTRODUODENOSCOPY (EGD) WITH PROPOFOL N/A 03/16/2016   Procedure: ESOPHAGOGASTRODUODENOSCOPY (EGD) WITH PROPOFOL;  Surgeon: Manya Silvas, MD;  Location: [facility] ENDOSCOPY;  Service: Endoscopy;  Laterality: N/A;  . ESOPHAGOGASTRODUODENOSCOPY (EGD) WITH PROPOFOL N/A 09/14/2016   Procedure: ESOPHAGOGASTRODUODENOSCOPY (EGD) WITH PROPOFOL;  Surgeon: Manya Silvas, MD;  Location: [facility] ENDOSCOPY;  Service: Endoscopy;  Laterality: N/A;  . ESOPHAGOGASTRODUODENOSCOPY (EGD) WITH PROPOFOL N/A 11/05/2016   Procedure: ESOPHAGOGASTRODUODENOSCOPY (EGD) WITH PROPOFOL;  Surgeon: Manya Silvas, MD;  Location: [facility] ENDOSCOPY;  Service: Endoscopy;  Laterality: N/A;  . ESOPHAGOGASTRODUODENOSCOPY (EGD) WITH PROPOFOL N/A 02/06/2017   Procedure: ESOPHAGOGASTRODUODENOSCOPY (EGD) WITH PROPOFOL;  Surgeon: Manya Silvas, MD;  Location: [facility] ENDOSCOPY;  Service: Endoscopy;  Laterality: N/A;  . ESOPHAGOGASTRODUODENOSCOPY (EGD) WITH PROPOFOL N/A 04/24/2017    Procedure: ESOPHAGOGASTRODUODENOSCOPY (EGD) WITH PROPOFOL;  Surgeon: Manya Silvas, MD;  Location: [facility] ENDOSCOPY;  Service: Endoscopy;  Laterality: N/A;  . ESOPHAGOGASTRODUODENOSCOPY (EGD) WITH PROPOFOL N/A 07/31/2017   Procedure: ESOPHAGOGASTRODUODENOSCOPY (EGD) WITH PROPOFOL;  Surgeon: Manya Silvas, MD;  Location: [facility] ENDOSCOPY;  Service: Endoscopy;  Laterality: N/A;  . ESOPHAGOGASTRODUODENOSCOPY (EGD) WITH PROPOFOL N/A 08/08/2018   Procedure: ESOPHAGOGASTRODUODENOSCOPY (EGD) WITH PROPOFOL;  Surgeon: Manya Silvas, MD;  Location: [facility] ENDOSCOPY;  Service: Endoscopy;  Laterality: N/A;  . ESOPHAGOGASTRODUODENOSCOPY (EGD) WITH PROPOFOL N/A 05/21/2020   Procedure: ESOPHAGOGASTRODUODENOSCOPY (EGD) WITH PROPOFOL;  Surgeon: [name] Manifold, MD;  Location: [facility] ENDOSCOPY;  Service: Endoscopy;  Laterality: N/A;  . ESOPHAGOGASTRODUODENOSCOPY (EGD) WITH PROPOFOL N/A 07/18/2020   Procedure: ESOPHAGOGASTRODUODENOSCOPY (EGD) WITH PROPOFOL;  Surgeon: Toledo, Benay Pike, MD;  Location: [facility] ENDOSCOPY;  Service: Gastroenterology;  Laterality: N/A;  . GIVENS CAPSULE STUDY N/A 12/05/2017   Procedure: GIVENS CAPSULE STUDY;  Surgeon: Manya Silvas, MD;  Location: [facility] ENDOSCOPY;  Service: Endoscopy;  Laterality: N/A;  . TONSILLECTOMY    . TONSILLECTOMY AND ADENOIDECTOMY    . ULNAR NERVE TRANSPOSITION    . UVULOPALATOPHARYNGOPLASTY      Social History:  reports that he has quit smoking. His smoking use included cigars and cigarettes. He quit after 20.00 years of use. He has never used smokeless tobacco. He reports that he does not drink alcohol and does not use drugs.   No Known Allergies  Family History  Problem  Relation Age of Onset  . Diabetes Other   . Transient ischemic attack Father   . CAD Father     Prior to Admission medications   Medication Sig Start Date End Date Taking? Authorizing Provider  atorvastatin (LIPITOR) 10 MG tablet Take 20 mg by mouth every evening.  03/23/20    [provider]  ferrous sulfate 325 (65 FE) MG tablet Take 1 tablet (325 mg total) by mouth daily. This medication can cause black stools 06/11/20   Wyvonnia Dusky, MD  hydrALAZINE (APRESOLINE) 50 MG tablet Take 1 tablet (50 mg total) by mouth 2 (two) times daily. 05/03/20   Ezekiel Slocumb, DO  Ipratropium-Albuterol (COMBIVENT RESPIMAT) 20-100 MCG/ACT AERS respimat Inhale 1 puff into the lungs every 6 (six) hours. Patient taking differently: Inhale 1 puff into the lungs every 6 (six) hours as needed.  02/12/20   Loletha Grayer, MD  lactulose (CHRONULAC) 10 GM/15ML solution Take 90 mLs (60 g total) by mouth daily. Patient taking differently: Take 45 g by mouth 2 (two) times daily.  05/04/20   Ezekiel Slocumb, DO  LORazepam (ATIVAN) 0.5 MG tablet Take 1 tablet (0.5 mg total) by mouth 3 (three) times daily as needed for Anxiety    [provider]  metolazone (ZAROXOLYN) 5 MG tablet Take 5 mg by mouth daily. . 05/13/20   [provider]  NOVOLOG MIX 70/30 FLEXPEN (70-30) 100 UNIT/ML FlexPen 0-84 Units as directed. Inject 0 - 84 Units (sliding scale) Subcutaneously twice a day before meals, at breakfast and at dinner. 03/17/20   [provider]  pantoprazole (PROTONIX) 40 MG tablet Take 1 tablet (40 mg total) by mouth 2 (two) times daily. 03/06/19 07/07/20  Dustin Flock, MD  potassium chloride SA (KLOR-CON) 20 MEQ tablet Take 20 mEq by mouth 2 (two) times daily. 07/06/20   [provider]  rifaximin (XIFAXAN) 550 MG TABS tablet Take 1 tablet (550 mg total) by mouth 2 (two) times daily. 02/12/20   Loletha Grayer, MD  sodium chloride (OCEAN) 0.65 % SOLN nasal spray Place 1 spray into both nostrils as needed for congestion (nose irritation). 02/12/20   Loletha Grayer, MD  tamsulosin (FLOMAX) 0.4 MG CAPS capsule Take 1 capsule (0.4 mg total) by mouth daily after supper. 05/03/20   Ezekiel Slocumb, DO  torsemide (DEMADEX) 100 MG tablet Take 1 tablet (100 mg  total) by mouth daily. 05/04/20   Ezekiel Slocumb, DO     Objective    Physical Exam: Vitals:   07/25/20 1337 07/25/20 1339 07/25/20 1816 07/25/20 1825  BP: (!) 152/63  (!) 124/58 (!) 134/47  Pulse: 64  63 63  Resp: (!) 22  20   Temp: 97.7 F (36.5 C)  (!) 97.5 F (36.4 C)   TempSrc: Oral  Oral   SpO2: 100%  99% 97%  Weight:  136.1 kg      General: appears to be stated age; alert, oriented Skin: warm, dry;  Head:  AT/[place] Eyes:  PEARL b/l, EOMI Mouth:  Oral mucosa membranes appear dry, normal dentition Neck: supple; trachea midline Heart:  RRR; did not appreciate any M/R/G Lungs: Diminished bibasilar breath sounds, but otherwise CTAB; did not appreciate any wheezes, rales, or rhonchi Abdomen: + BS; soft, NT Extremities: 2+ pitting edema in bilateral lower extremities; no muscle wasting Neuro: strength and sensation intact in upper and lower extremities b/l   Labs on Admission: I have personally reviewed following labs and imaging studies  CBC: Recent  Labs  Lab 07/25/20 1344  WBC 5.5  HGB 6.8*  HCT 20.7*  MCV 92.8  PLT 638*   Basic Metabolic Panel: Recent Labs  Lab 07/25/20 1344  NA 137  K 3.2*  CL 101  CO2 24  GLUCOSE 157*  BUN 113*  CREATININE 3.39*  CALCIUM 8.6*   GFR: Estimated Creatinine Clearance: 28.6 mL/min (A) (by C-G formula based on SCr of 3.39 mg/dL (H)). Liver Function Tests: Recent Labs  Lab 07/25/20 1344  AST 35  ALT 25  ALKPHOS 80  BILITOT 0.8  PROT 6.4*  ALBUMIN 2.7*   No results for input(s): LIPASE, AMYLASE in the last 168 hours. No results for input(s): AMMONIA in the last 168 hours. Coagulation Profile: No results for input(s): INR, PROTIME in the last 168 hours. Cardiac Enzymes: No results for input(s): CKTOTAL, CKMB, CKMBINDEX, TROPONINI in the last 168 hours. BNP (last 3 results) No results for input(s): PROBNP in the last 8760 hours. HbA1C: No results for input(s): HGBA1C in the last 72 hours. CBG: No results for  input(s): GLUCAP in the last 168 hours. Lipid Profile: No results for input(s): CHOL, HDL, LDLCALC, TRIG, CHOLHDL, LDLDIRECT in the last 72 hours. Thyroid Function Tests: No results for input(s): TSH, T4TOTAL, FREET4, T3FREE, THYROIDAB in the last 72 hours. Anemia Panel: No results for input(s): VITAMINB12, FOLATE, FERRITIN, TIBC, IRON, RETICCTPCT in the last 72 hours. Urine analysis:    Component Value Date/Time   COLORURINE STRAW (A) 07/07/2020 1035   APPEARANCEUR CLEAR (A) 07/07/2020 1035   APPEARANCEUR Clear 09/10/2014 0456   LABSPEC 1.008 07/07/2020 1035   LABSPEC 1.016 09/10/2014 0456   PHURINE 6.0 07/07/2020 1035   GLUCOSEU NEGATIVE 07/07/2020 1035   GLUCOSEU Negative 09/10/2014 0456   HGBUR NEGATIVE 07/07/2020 1035   BILIRUBINUR NEGATIVE 07/07/2020 1035   BILIRUBINUR Negative 09/10/2014 0456   KETONESUR NEGATIVE 07/07/2020 1035   PROTEINUR 30 (A) 07/07/2020 1035   NITRITE NEGATIVE 07/07/2020 1035   LEUKOCYTESUR NEGATIVE 07/07/2020 1035   LEUKOCYTESUR Negative 09/10/2014 0456    Radiological Exams on Admission: DG Chest 2 View  Result Date: 07/25/2020 CLINICAL DATA:  Shortness of breath EXAM: CHEST - 2 VIEW COMPARISON:  07/07/2020 FINDINGS: Low lung volumes. Pulmonary vascular congestion. No pleural effusion or pneumothorax. Cardiomegaly. No acute osseous abnormality. IMPRESSION: Cardiomegaly and pulmonary vascular congestion similar to prior study. Electronically Signed   By: Macy Mis M.D.   On: 07/25/2020 14:47     Assessment/Plan   TRAE BOVENZI is a 69 y.o. 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     Principal Problem:   Acute on chronic anemia Active Problems:   Cirrhosis ([place])   Acute kidney injury superimposed on CKD (HCC)   Type 2 diabetes mellitus with diabetic nephropathy, with long-term current use of insulin (HCC)   Chronic diastolic CHF (congestive heart failure) (HCC)   Generalized weakness    #) Acute on chronic anemia: In the setting of chronic anemia associated with anemia of chronic kidney disease, cirrhosis, and chronic iron deficiency anemia in the setting of  recurrent upper gastrointestinal bleeds with baseline hemoglobin of 7-8, presenting hemoglobin this evening found to be slightly low relative to this baseline range at 6.8.  Suspect that this acute exacerbation is also multifactorial, with potential contribution from subacute on chronic upper gastrointestinal bleed as well as ensuing acute kidney injury superimposed on chronic kidney disease as a result of ensuing diminished renal perfusion as a result of diminished oxygen carrying capacity.  Will consider patient's acute on chronic anemia to be symptomatic in nature given his reported generalized weakness, lightheadedness, dizziness.  He appears to be hemodynamically stable, and denies any recent gastrointestinal bleed, including no recent hematemesis, melena, or hematochezia.  He is not on any blood thinners as an outpatient.  The patient's case was discussed with the on-call gastroenterologist, Dr. Haig Prophet with ensuing recommendation for slow transfusion 1 unit PRBC, Protonix 40 mg IV twice daily, and plan for Dr. Haig Prophet to evaluate patient in person.  Additionally, per GI recs, no indication for Protonix drip, octreotide drip, or SBP prophylaxis at this time, as further described  above.  In the context of recurrent upper gastrointestinal bleeds, also on iron studies to pretransfusion labs to assess if the patient would benefit from IV iron infusion.  Plan: Continue transfusion of 1 unit PRBC over the course of 4 hours, following which will recheck H&H at around 2300 this evening.  Will then repeat CBC with morning labs.  I have also ordered a repeat H&H for 9 AM on 07/26/2020.  Monitor strict I's and O's and daily weights.  Monitor on telemetry.  Monitor continuous pulse oximetry.  Add on INR.  Refrain from pharmacologic DVT prophylaxis.  Protonix 40 mg IV twice daily.  Formal gastroenterology consult, as above.  Add on the following to pretransfusion labs: Ferritin, TIBC, total iron.      #) Acute kidney injury superimposed on stage IV chronic kidney disease: In the setting of baseline creatinine of 2.7-2.9, presenting creatinine noted to be 3.39 relative to most recent prior serum creatinine data point of 2.69 on 07/09/2020.  Case was discussed with the on-call nephrologist, Dr. Pierce Crane, who potential contributory prerenal etiology in the setting of acute on chronic anemia induced diminished renal perfusion in the setting of associated diminished oxygen carrying capacity. Dr. Pierce Crane agreed with slow blood transfusion due to this possibility with repeat renal labs in the morning to evaluate interval trend in kidney function and response to this intervention.  He also recommended refraining from overnight diuretics given suspected intravascular depletion in spite of a scenario in which the patient is likely total body water long.   Plan: Check urinalysis with microscopy to evaluate for the presence of urinary casts.  Check random urine sodium as well as random urine creatinine.  Monitor strict I's and O's and daily weights.  Him to avoid nephrotoxic agents.  Gentle blood transfusion, as above.  Repeat BMP in the morning.  Check serum magnesium, phosphorus, and ionized calcium.  Formal  nephrology consult, as above.      #) Generalized weakness: Suspect contribution from presenting acute on chronic anemia, as further described above.  No evidence of acute focal neurologic deficits.  No evidence of suggest underlying infectious process at this time, including negative nasopharyngeal COVID-19 PCR checked in the ED this evening.  Plan: Slow transfusion 1 unit previously, as above.  We will also check TSH.  Repeat BMP and CBC in the morning.  May consider PT consult should there not be interval improvement in generalized weakness following  sufficient PRBC transfusion.      #) Cirrhosis: Complicated by hepatic encephalopathy, portal hypertensive gastropathy, esophageal varices.  Patient reports that his outpatient Lasix was recently discontinued.  Does not appear that he is currently on spironolactone.  He does however take scheduled lactulose as well as rifaximin.  Unable to calculate meld score at this time the absence of INR value.   Plan: Add on INR.  Repeat BMP and INR in the morning.  Monitor strict I's and O's and daily weights.      #) Chronic diastolic heart failure: Most recent echocardiogram was performed in July 2021 and showed a needed left ventricle, LVEF 50 to 55%, no evidence of focal wall motion abnormalities, grade 2 diastolic dysfunction.  Current outpatient diuretic regimen consists of metolazone as well as torsemide.  Of note, the patient reports recent discontinuation of his home Lasix.  Clinically, presentation does not appear to be suggestive of acute decompensated heart failure given the patient's reported baseline shortness of breath, without any significant recent worsening thereof, as well as presenting chest x-ray showing no interval change in stable cardiomegaly along with mild pulmonary vascular congestion.  Per recommendations of nephrology, will refrain from overnight diuretics, and reevaluate volume status in the morning.  Plan: Monitor strict  I's and O's and daily weights.  Add on serum magnesium level.  Repeat BMP in the morning.  Holding home diuretics overnight, with plan to reevaluate volume status in the morning.      #) Type 2 diabetes mellitus: Complicated by diabetic nephropathy.  Outpatient insulin regimen consists of NovoLog 70/30 sliding scale twice daily with meals, of which the patient reports that he typically takes 50 to 70 units 2 times per day.  Most recently he took 60 units of his NovoLog 70/30 on the morning of 07/25/2020.  Presenting glucose per presenting CMP noted to be 157.  Plan: Levemir 5 units subcu twice daily.  Accu-Cheks every 6 hours with low-dose sliding scale insulin in the setting clear liquid diet at this time.      #) Generalized anxiety disorder: On as needed Ativan as an outpatient.  Does not appear that patient is currently on a scheduled antidepressant class medication.  Plan: Continue home as needed Ativan.      #) BPH: On Flomax as an outpatient.  Plan: Monitor strict I's and O's and daily weights.  Continue Flomax.  Repeat BMP in the morning.     DVT prophylaxis: scd's  Code Status: Full code Family Communication: none Disposition Plan: Per Rounding Team Consults called: Patient's case was discussed with the on-call gastroenterologist (Dr. Haig Prophet) as well as the on-call nephrologist (Dr. Pierce Crane), as further detailed above. Admission status: Observation; med telemetry.   PLEASE NOTE THAT DRAGON DICTATION SOFTWARE WAS USED IN THE CONSTRUCTION OF THIS NOTE.   [facility] Pager [phone] From 12PM- 12AM  Otherwise, please contact night-coverage  [email or web address] Password [facility]  07/25/2020, 6:52 PM

## 2020-07-25 NOTE — Telephone Encounter (Signed)
Patient can be scheduled for lab/blood transfusion this week (first available), there are no chairs available today.

## 2020-07-25 NOTE — ED Provider Notes (Signed)
[facility] Emergency Department Provider Note  ____________________________________________   First MD Initiated Contact with Patient 07/25/20 1725     (approximate)  I have reviewed the triage vital signs and the nursing notes.   HISTORY  Chief Complaint Abnormal Lab and Sent by PCP    HPI Corey West is a 69 y.o. male  With h/o AFib, cirrhosis, GAVE and portal gastropathy, s/p recent EGD on 10/18 with Dr. [name], here for weakness, transfusion. Pt reports that since his EGD on 10/18, he has felt generally fatigued, tired. He has noticed more SOB w/ exertion as well as lightheadedness upon standing. He saw his PCP today and was sent in due to downtrending Hgb. Denies any abd pain, nausea, vomiting. No hematemesis, melena, or overt blood in stool. Reports he feels weak, tired but no CP. No SOB at rest. He has required transfusions previously and tolerated well. He does note some more b/l LE swelling which seems worse than usual, and he was recently taken off his lasix 2/2 worsening kidney disease.        Past Medical History:  Diagnosis Date  . A-fib ([place])   . Anemia   . Anxiety    controlled;   . Arthritis   . AVM (arteriovenous malformation) of stomach, acquired with hemorrhage   . Barrett's esophagus   . CHF (congestive heart failure) ([place])   . Chronic kidney disease    renal infufficiency  . Cirrhosis ([place])   . COPD (chronic obstructive pulmonary disease) ([place])   . Depression    controlled;   Marland Kitchen Diabetes mellitus without complication ([place])    not controlled, taking insulin but sugar continues to run high;   Marland Kitchen Dysrhythmia   . Edema   . Esophageal varices ([place])   . GAVE (gastric antral vascular ectasia)   . GERD (gastroesophageal reflux disease)   . Hepatic encephalopathy ([place])   . History of hiatal hernia   . Hyperlipidemia   . Hypertension    controlled well;   . IPMN (intraductal papillary mucinous neoplasm)   . Liver cirrhosis ([place])    . Nephropathy, diabetic ([place])   . Obesity   . Pancytopenia ([place])   . Polyp, stomach    with chronic blood loss  . Portal hypertensive gastropathy ([place])   . Sleep apnea    does not wear a cpap, Medicare would not pay for it   . Venous stasis dermatitis of both lower extremities   . Venous stasis of both lower extremities    with cellulitis    Patient Active Problem List   Diagnosis Date Noted  . SIRS (systemic inflammatory response syndrome) ([place]) 07/07/2020  . Anemia   . Hypothermia   . Anemia of chronic disease 06/07/2020  . Prolonged QT interval 06/07/2020  . Hypoglycemia 06/07/2020  . Ascites 06/07/2020  . Portal hypertensive gastropathy ([place])   . Symptomatic anemia 05/19/2020  . Acute blood loss anemia 05/19/2020  . Acute GI bleeding 05/19/2020  . Bacteremia due to Klebsiella pneumoniae 04/26/2020  . Palliative care by specialist   . DNR (do not resuscitate) discussion   . COPD with chronic bronchitis ([place]) 04/23/2020  . Paroxysmal atrial fibrillation ([place]) 04/20/2020  . Cellulitis of left leg 02/23/2020  . Contusion of ribs, left, initial encounter 02/23/2020  . Acute hypoxemic respiratory failure ([place]) 02/04/2020  . Volume overload 01/28/2020  . Hyperlipidemia associated with type 2 diabetes mellitus ([place]) 01/28/2020  . Liver cirrhosis ([place])   . Anxiety   .  SOB (shortness of breath)   . Elevated troponin   . CKD (chronic kidney disease) stage 4, GFR 15-29 ml/min (HCC)   . Unstageable pressure ulcer of sacral region ([place]) 11/28/2019  . Chronic diastolic CHF (congestive heart failure) ([place]) 11/27/2019  . Type II diabetes mellitus with renal manifestations ([place]) 11/27/2019  . Cellulitis of left lower extremity 11/27/2019  . Cellulitis of lower extremity 11/27/2019  . Cyst of pancreas 05/18/2019  . HCAP (healthcare-associated pneumonia) 02/03/2019  . Aphasia 01/14/2019  . Acute on chronic renal failure ([place]) 01/14/2019  . Malnutrition of moderate degree  12/03/2018  . C. difficile diarrhea 12/02/2018  . Ankle fracture, left, closed, initial encounter 10/15/2018  . Ankle fracture, right, closed, initial encounter 10/15/2018  . Ankle fracture 10/15/2018  . Pressure injury of skin 07/19/2018  . UTI (urinary tract infection) 07/18/2018  . Acute kidney injury superimposed on CKD ([place]) 01/02/2018  . 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 Goals of care, counseling/discussion   . Palliative care encounter   . Acute hepatic encephalopathy 12/16/2017  . Hepatic encephalopathy ([place]) 10/18/2017  . Hypokalemia 10/09/2017  . Hyperglycemia 10/09/2017  . Sepsis ([place]) 06/12/2017  . Type 2 diabetes mellitus with diabetic nephropathy, with long-term current use of insulin ([place]) 05/29/2017  . Long term (current) use of insulin ([place]) 05/29/2017  . Cellulitis in diabetic foot ([place]) 02/21/2017  . Cellulitis 02/21/2017  . Upper GI bleed/melena 07/20/2015  . Hematemesis 02/08/2015  . Cirrhosis ([place]) 02/08/2015  . Esophageal varices ([place]) 02/08/2015  . Gastroesophageal reflux disease 02/08/2015  . Hyperlipidemia 02/08/2015  . Chronic venous stasis dermatitis of both lower extremities 10/06/2014  . Cutaneous abscess of limb, unspecified 09/17/2014  . Edema 05/25/2014  . Hypertension 05/25/2014  . Obesity, Class III, BMI 40-49.9 (morbid obesity) ([place]) 05/25/2014  . Sleep apnea 05/25/2014  . Barrett's esophagus 05/25/2014  . Pancytopenia ([place]) 05/25/2014  . History of esophageal varices 05/25/2014  . Iron deficiency anemia 12/04/2013  . Thrombocytopenia ([place]) 12/04/2013  . Splenomegaly 11/04/2013    Past Surgical History:  Procedure Laterality Date  . ESOPHAGOGASTRODUODENOSCOPY N/A 02/09/2015   Procedure: ESOPHAGOGASTRODUODENOSCOPY (EGD);  Surgeon: Manya Silvas, MD;  Location: [facility] ENDOSCOPY;  Service: Endoscopy;  Laterality: N/A;  . ESOPHAGOGASTRODUODENOSCOPY N/A 07/22/2015   Procedure: ESOPHAGOGASTRODUODENOSCOPY (EGD);  Surgeon:  Manya Silvas, MD;  Location: [facility] ENDOSCOPY;  Service: Endoscopy;  Laterality: N/A;  . ESOPHAGOGASTRODUODENOSCOPY N/A 06/26/2017   Procedure: ESOPHAGOGASTRODUODENOSCOPY (EGD);  Surgeon: Manya Silvas, MD;  Location: [facility] ENDOSCOPY;  Service: Endoscopy;  Laterality: N/A;  . ESOPHAGOGASTRODUODENOSCOPY N/A 06/27/2017   Procedure: ESOPHAGOGASTRODUODENOSCOPY (EGD);  Surgeon: Manya Silvas, MD;  Location: [facility] ENDOSCOPY;  Service: Endoscopy;  Laterality: N/A;  . ESOPHAGOGASTRODUODENOSCOPY N/A 06/28/2017   Procedure: ESOPHAGOGASTRODUODENOSCOPY (EGD);  Surgeon: Manya Silvas, MD;  Location: [facility] ENDOSCOPY;  Service: Endoscopy;  Laterality: N/A;  . ESOPHAGOGASTRODUODENOSCOPY Bilateral 10/11/2017   Procedure: ESOPHAGOGASTRODUODENOSCOPY (EGD);  Surgeon: Manya Silvas, MD;  Location: [facility] ENDOSCOPY;  Service: Endoscopy;  Laterality: Bilateral;  . ESOPHAGOGASTRODUODENOSCOPY N/A 12/04/2017   Procedure: ESOPHAGOGASTRODUODENOSCOPY (EGD);  Surgeon: Manya Silvas, MD;  Location: [facility] ENDOSCOPY;  Service: Endoscopy;  Laterality: N/A;  . ESOPHAGOGASTRODUODENOSCOPY N/A 03/06/2019   Procedure: ESOPHAGOGASTRODUODENOSCOPY (EGD);  Surgeon: Toledo, Benay Pike, MD;  Location: [facility] ENDOSCOPY;  Service: Gastroenterology;  Laterality: N/A;  . ESOPHAGOGASTRODUODENOSCOPY (EGD) WITH PROPOFOL N/A 07/20/2015   Procedure: ESOPHAGOGASTRODUODENOSCOPY (EGD) WITH PROPOFOL;  Surgeon: Manya Silvas, MD;  Location: [facility] ENDOSCOPY;  Service: Endoscopy;  Laterality: N/A;  . ESOPHAGOGASTRODUODENOSCOPY (EGD)  WITH PROPOFOL N/A 09/16/2015   Procedure: ESOPHAGOGASTRODUODENOSCOPY (EGD) WITH PROPOFOL;  Surgeon: Manya Silvas, MD;  Location: [facility] ENDOSCOPY;  Service: Endoscopy;  Laterality: N/A;  . ESOPHAGOGASTRODUODENOSCOPY (EGD) WITH PROPOFOL N/A 03/16/2016   Procedure: ESOPHAGOGASTRODUODENOSCOPY (EGD) WITH PROPOFOL;  Surgeon: Manya Silvas, MD;  Location: [facility] ENDOSCOPY;  Service: Endoscopy;  Laterality: N/A;  .  ESOPHAGOGASTRODUODENOSCOPY (EGD) WITH PROPOFOL N/A 09/14/2016   Procedure: ESOPHAGOGASTRODUODENOSCOPY (EGD) WITH PROPOFOL;  Surgeon: Manya Silvas, MD;  Location: [facility] ENDOSCOPY;  Service: Endoscopy;  Laterality: N/A;  . ESOPHAGOGASTRODUODENOSCOPY (EGD) WITH PROPOFOL N/A 11/05/2016   Procedure: ESOPHAGOGASTRODUODENOSCOPY (EGD) WITH PROPOFOL;  Surgeon: Manya Silvas, MD;  Location: [facility] ENDOSCOPY;  Service: Endoscopy;  Laterality: N/A;  . ESOPHAGOGASTRODUODENOSCOPY (EGD) WITH PROPOFOL N/A 02/06/2017   Procedure: ESOPHAGOGASTRODUODENOSCOPY (EGD) WITH PROPOFOL;  Surgeon: Manya Silvas, MD;  Location: [facility] ENDOSCOPY;  Service: Endoscopy;  Laterality: N/A;  . ESOPHAGOGASTRODUODENOSCOPY (EGD) WITH PROPOFOL N/A 04/24/2017   Procedure: ESOPHAGOGASTRODUODENOSCOPY (EGD) WITH PROPOFOL;  Surgeon: Manya Silvas, MD;  Location: [facility] ENDOSCOPY;  Service: Endoscopy;  Laterality: N/A;  . ESOPHAGOGASTRODUODENOSCOPY (EGD) WITH PROPOFOL N/A 07/31/2017   Procedure: ESOPHAGOGASTRODUODENOSCOPY (EGD) WITH PROPOFOL;  Surgeon: Manya Silvas, MD;  Location: [facility] ENDOSCOPY;  Service: Endoscopy;  Laterality: N/A;  . ESOPHAGOGASTRODUODENOSCOPY (EGD) WITH PROPOFOL N/A 08/08/2018   Procedure: ESOPHAGOGASTRODUODENOSCOPY (EGD) WITH PROPOFOL;  Surgeon: Manya Silvas, MD;  Location: [facility] ENDOSCOPY;  Service: Endoscopy;  Laterality: N/A;  . ESOPHAGOGASTRODUODENOSCOPY (EGD) WITH PROPOFOL N/A 05/21/2020   Procedure: ESOPHAGOGASTRODUODENOSCOPY (EGD) WITH PROPOFOL;  Surgeon: [name] Manifold, MD;  Location: [facility] ENDOSCOPY;  Service: Endoscopy;  Laterality: N/A;  . ESOPHAGOGASTRODUODENOSCOPY (EGD) WITH PROPOFOL N/A 07/18/2020   Procedure: ESOPHAGOGASTRODUODENOSCOPY (EGD) WITH PROPOFOL;  Surgeon: Toledo, Benay Pike, MD;  Location: [facility] ENDOSCOPY;  Service: Gastroenterology;  Laterality: N/A;  . GIVENS CAPSULE STUDY N/A 12/05/2017   Procedure: GIVENS CAPSULE STUDY;  Surgeon: Manya Silvas, MD;  Location: [facility] ENDOSCOPY;   Service: Endoscopy;  Laterality: N/A;  . TONSILLECTOMY    . TONSILLECTOMY AND ADENOIDECTOMY    . ULNAR NERVE TRANSPOSITION    . UVULOPALATOPHARYNGOPLASTY      Prior to Admission medications   Medication Sig Start Date End Date Taking? Authorizing Provider  atorvastatin (LIPITOR) 10 MG tablet Take 20 mg by mouth every evening.  03/23/20   [provider]  ferrous sulfate 325 (65 FE) MG tablet Take 1 tablet (325 mg total) by mouth daily. This medication can cause black stools 06/11/20   Wyvonnia Dusky, MD  hydrALAZINE (APRESOLINE) 50 MG tablet Take 1 tablet (50 mg total) by mouth 2 (two) times daily. 05/03/20   Ezekiel Slocumb, DO  Ipratropium-Albuterol (COMBIVENT RESPIMAT) 20-100 MCG/ACT AERS respimat Inhale 1 puff into the lungs every 6 (six) hours. Patient taking differently: Inhale 1 puff into the lungs every 6 (six) hours as needed.  02/12/20   Loletha Grayer, MD  lactulose (CHRONULAC) 10 GM/15ML solution Take 90 mLs (60 g total) by mouth daily. Patient taking differently: Take 45 g by mouth 2 (two) times daily.  05/04/20   Ezekiel Slocumb, DO  LORazepam (ATIVAN) 0.5 MG tablet Take 1 tablet (0.5 mg total) by mouth 3 (three) times daily as needed for Anxiety    [provider]  metolazone (ZAROXOLYN) 5 MG tablet Take 5 mg by mouth daily. . 05/13/20   [provider]  NOVOLOG MIX 70/30 FLEXPEN (70-30) 100 UNIT/ML FlexPen 0-84 Units as directed. Inject 0 - 84 Units (sliding scale) Subcutaneously twice a day  before meals, at breakfast and at dinner. 03/17/20   [provider]  pantoprazole (PROTONIX) 40 MG tablet Take 1 tablet (40 mg total) by mouth 2 (two) times daily. 03/06/19 07/07/20  Dustin Flock, MD  potassium chloride SA (KLOR-CON) 20 MEQ tablet Take 20 mEq by mouth 2 (two) times daily. 07/06/20   [provider]  rifaximin (XIFAXAN) 550 MG TABS tablet Take 1 tablet (550 mg total) by mouth 2 (two) times daily. 02/12/20   Loletha Grayer, MD    sodium chloride (OCEAN) 0.65 % SOLN nasal spray Place 1 spray into both nostrils as needed for congestion (nose irritation). 02/12/20   Loletha Grayer, MD  tamsulosin (FLOMAX) 0.4 MG CAPS capsule Take 1 capsule (0.4 mg total) by mouth daily after supper. 05/03/20   Ezekiel Slocumb, DO  torsemide (DEMADEX) 100 MG tablet Take 1 tablet (100 mg total) by mouth daily. 05/04/20   Ezekiel Slocumb, DO    Allergies Patient has no known allergies.  Family History  Problem Relation Age of Onset  . Diabetes Other   . Transient ischemic attack Father   . CAD Father     Social History Social History   Tobacco Use  . Smoking status: Former Smoker    Years: 20.00    Types: Cigars, Cigarettes  . Smokeless tobacco: Never Used  Vaping Use  . Vaping Use: Never used  Substance Use Topics  . Alcohol use: No    Comment: stopped 15 years ago  . Drug use: No    Review of Systems  Review of Systems  Constitutional: Positive for fatigue. Negative for chills and fever.  HENT: Negative for sore throat.   Respiratory: Positive for shortness of breath.   Cardiovascular: Negative for chest pain.  Gastrointestinal: Negative for abdominal pain.  Genitourinary: Negative for flank pain.  Musculoskeletal: Negative for neck pain.  Skin: Negative for rash and wound.  Allergic/Immunologic: Negative for immunocompromised state.  Neurological: Positive for weakness and light-headedness. Negative for numbness.  Hematological: Does not bruise/bleed easily.  All other systems reviewed and are negative.    ____________________________________________  PHYSICAL EXAM:      VITAL SIGNS: ED Triage Vitals  Enc Vitals Group     BP 07/25/20 1337 (!) 152/63     Pulse Rate 07/25/20 1337 64     Resp 07/25/20 1337 (!) 22     Temp 07/25/20 1337 97.7 F (36.5 C)     Temp Source 07/25/20 1337 Oral     SpO2 07/25/20 1337 100 %     Weight 07/25/20 1339 300 lb (136.1 kg)     Height --      Head Circumference --       Peak Flow --      Pain Score --      Pain Loc --      Pain Edu? --      Excl. in [place]? --      Physical Exam Vitals and nursing note reviewed.  Constitutional:      General: He is not in acute distress.    Appearance: He is well-developed. He is obese.  HENT:     Head: Normocephalic and atraumatic.  Eyes:     Conjunctiva/sclera: Conjunctivae normal.  Cardiovascular:     Rate and Rhythm: Normal rate and regular rhythm.     Heart sounds: Normal heart sounds. No murmur heard.  No friction rub.  Pulmonary:     Effort: Pulmonary effort is normal. No respiratory distress.  Breath sounds: Normal breath sounds. No wheezing or rales.  Abdominal:     General: Abdomen is protuberant. There is distension.     Palpations: Abdomen is soft.     Tenderness: There is no abdominal tenderness.  Musculoskeletal:     Cervical back: Neck supple.     Right lower leg: Edema present.     Left lower leg: Edema present.  Skin:    General: Skin is warm.     Capillary Refill: Capillary refill takes less than 2 seconds.  Neurological:     Mental Status: He is alert and oriented to person, place, and time.     Motor: No abnormal muscle tone.       ____________________________________________   LABS (all labs ordered are listed, but only abnormal results are displayed)  Labs Reviewed  COMPREHENSIVE METABOLIC PANEL - Abnormal; Notable for the following components:      Result Value   Potassium 3.2 (*)    Glucose, Bld 157 (*)    BUN 113 (*)    Creatinine, Ser 3.39 (*)    Calcium 8.6 (*)    Total Protein 6.4 (*)    Albumin 2.7 (*)    GFR, Estimated 19 (*)    All other components within normal limits  CBC - Abnormal; Notable for the following components:   RBC 2.23 (*)    Hemoglobin 6.8 (*)    HCT 20.7 (*)    RDW 16.1 (*)    Platelets 111 (*)    All other components within normal limits  RESPIRATORY PANEL BY RT PCR (FLU A&B, COVID)  TYPE AND SCREEN  PREPARE RBC (CROSSMATCH)     ____________________________________________  EKG: Junctional rhythm, VR 65. QRS 88, QTc 470. Nonspecific ST changes. NO acute ST elevations or depressions.  ________________________________________  RADIOLOGY All imaging, including plain films, CT scans, and ultrasounds, independently reviewed by me, and interpretations confirmed via formal radiology reads.  ED MD interpretation:   CXR: Cardiomegaly, pulm vascular congestion  Official radiology report(s): DG Chest 2 View  Result Date: 07/25/2020 CLINICAL DATA:  Shortness of breath EXAM: CHEST - 2 VIEW COMPARISON:  07/07/2020 FINDINGS: Low lung volumes. Pulmonary vascular congestion. No pleural effusion or pneumothorax. Cardiomegaly. No acute osseous abnormality. IMPRESSION: Cardiomegaly and pulmonary vascular congestion similar to prior study. Electronically Signed   By: Macy Mis M.D.   On: 07/25/2020 14:47    ____________________________________________  PROCEDURES   Procedure(s) performed (including Critical Care):  .Critical Care Performed by: Duffy Bruce, MD Authorized by: Duffy Bruce, MD   Critical care provider statement:    Critical care time (minutes):  35   Critical care time was exclusive of:  Separately billable procedures and treating other patients and teaching time   Critical care was necessary to treat or prevent imminent or life-threatening deterioration of the following conditions:  Cardiac failure, circulatory failure and respiratory failure   Critical care was time spent personally by me on the following activities:  Development of treatment plan with patient or surrogate, discussions with consultants, evaluation of patient's response to treatment, examination of patient, obtaining history from patient or surrogate, ordering and performing treatments and interventions, ordering and review of laboratory studies, ordering and review of radiographic studies, pulse oximetry, re-evaluation of  patient's condition and review of old charts   I assumed direction of critical care for this patient from another provider in my specialty: no      ____________________________________________  INITIAL IMPRESSION / MDM / ASSESSMENT AND PLAN /  ED COURSE  As part of my medical decision making, I reviewed the following data within the [place] notes reviewed and incorporated, Old chart reviewed, Notes from prior ED visits, and [place] Controlled Substance Database       *IBRAHIMA West was evaluated in Emergency Department on 07/25/2020 for the symptoms described in the history of present illness. He was evaluated in the context of the global COVID-19 pandemic, which necessitated consideration that the patient might be at risk for infection with the SARS-CoV-2 virus that causes COVID-19. Institutional protocols and algorithms that pertain to the evaluation of patients at risk for COVID-19 are in a state of rapid change based on information released by regulatory bodies including the CDC and federal and state organizations. These policies and algorithms were followed during the patient's care in the ED.  Some ED evaluations and interventions may be delayed as a result of limited staffing during the pandemic.*     Medical Decision Making:  69 yo M here with SOB w/ exertion, fatigue. Labs show Hgb 6.8, down from recent trend and concerning for ongoing, though likely slow, UGIB. S/p recent EGD with Dr. [name] on 10/18 - reviewed images, results. Will transfuse 1u PRBC. Labs also c/w significant AoCKD with uremia, likely from poor renal perfusion. Dr. Holley Raring aware, pt sees Dr. Candiss Norse. Agrees w/ transfusion, ideally hold on lasix unless absolutely needed. I also discussed with GI Dr. Haig Prophet - pt does not need to immediately receive protonix drip or aggressive tx, recommends trending.  ____________________________________________  FINAL CLINICAL IMPRESSION(S) / ED DIAGNOSES  Final  diagnoses:  Symptomatic anemia  Cirrhosis of liver with ascites, unspecified hepatic cirrhosis type ([place])  Acute renal failure superimposed on stage 4 chronic kidney disease, unspecified acute renal failure type ([place])     MEDICATIONS GIVEN DURING THIS VISIT:  Medications  0.9 %  sodium chloride infusion (has no administration in time range)  pantoprazole (PROTONIX) injection 40 mg (40 mg Intravenous Given 07/25/20 1839)     ED Discharge Orders    None       Note:  This document was prepared using Dragon voice recognition software and may include unintentional dictation errors.   Duffy Bruce, MD 07/25/20 [phone]

## 2020-07-26 DIAGNOSIS — J449 Chronic obstructive pulmonary disease, unspecified: Secondary | ICD-10-CM | POA: Diagnosis present

## 2020-07-26 DIAGNOSIS — N184 Chronic kidney disease, stage 4 (severe): Secondary | ICD-10-CM | POA: Diagnosis present

## 2020-07-26 DIAGNOSIS — R350 Frequency of micturition: Secondary | ICD-10-CM | POA: Diagnosis not present

## 2020-07-26 DIAGNOSIS — K31819 Angiodysplasia of stomach and duodenum without bleeding: Secondary | ICD-10-CM | POA: Diagnosis present

## 2020-07-26 DIAGNOSIS — Z20822 Contact with and (suspected) exposure to covid-19: Secondary | ICD-10-CM | POA: Diagnosis present

## 2020-07-26 DIAGNOSIS — K922 Gastrointestinal hemorrhage, unspecified: Secondary | ICD-10-CM | POA: Diagnosis present

## 2020-07-26 DIAGNOSIS — N2581 Secondary hyperparathyroidism of renal origin: Secondary | ICD-10-CM | POA: Diagnosis present

## 2020-07-26 DIAGNOSIS — G4730 Sleep apnea, unspecified: Secondary | ICD-10-CM | POA: Diagnosis present

## 2020-07-26 DIAGNOSIS — F419 Anxiety disorder, unspecified: Secondary | ICD-10-CM | POA: Diagnosis present

## 2020-07-26 DIAGNOSIS — K766 Portal hypertension: Secondary | ICD-10-CM | POA: Diagnosis present

## 2020-07-26 DIAGNOSIS — K219 Gastro-esophageal reflux disease without esophagitis: Secondary | ICD-10-CM | POA: Diagnosis present

## 2020-07-26 DIAGNOSIS — I8510 Secondary esophageal varices without bleeding: Secondary | ICD-10-CM | POA: Diagnosis present

## 2020-07-26 DIAGNOSIS — N433 Hydrocele, unspecified: Secondary | ICD-10-CM | POA: Diagnosis not present

## 2020-07-26 DIAGNOSIS — D500 Iron deficiency anemia secondary to blood loss (chronic): Secondary | ICD-10-CM | POA: Diagnosis present

## 2020-07-26 DIAGNOSIS — K3189 Other diseases of stomach and duodenum: Secondary | ICD-10-CM | POA: Diagnosis present

## 2020-07-26 DIAGNOSIS — I5032 Chronic diastolic (congestive) heart failure: Secondary | ICD-10-CM

## 2020-07-26 DIAGNOSIS — I4891 Unspecified atrial fibrillation: Secondary | ICD-10-CM | POA: Diagnosis present

## 2020-07-26 DIAGNOSIS — M1990 Unspecified osteoarthritis, unspecified site: Secondary | ICD-10-CM | POA: Diagnosis present

## 2020-07-26 DIAGNOSIS — I130 Hypertensive heart and chronic kidney disease with heart failure and stage 1 through stage 4 chronic kidney disease, or unspecified chronic kidney disease: Secondary | ICD-10-CM | POA: Diagnosis present

## 2020-07-26 DIAGNOSIS — K7460 Unspecified cirrhosis of liver: Secondary | ICD-10-CM | POA: Diagnosis present

## 2020-07-26 DIAGNOSIS — K7581 Nonalcoholic steatohepatitis (NASH): Secondary | ICD-10-CM | POA: Diagnosis present

## 2020-07-26 DIAGNOSIS — N179 Acute kidney failure, unspecified: Secondary | ICD-10-CM | POA: Diagnosis present

## 2020-07-26 DIAGNOSIS — D649 Anemia, unspecified: Secondary | ICD-10-CM | POA: Diagnosis present

## 2020-07-26 DIAGNOSIS — R188 Other ascites: Secondary | ICD-10-CM | POA: Diagnosis present

## 2020-07-26 DIAGNOSIS — L97929 Non-pressure chronic ulcer of unspecified part of left lower leg with unspecified severity: Secondary | ICD-10-CM | POA: Diagnosis present

## 2020-07-26 DIAGNOSIS — E785 Hyperlipidemia, unspecified: Secondary | ICD-10-CM | POA: Diagnosis present

## 2020-07-26 DIAGNOSIS — N4883 Acquired buried penis: Secondary | ICD-10-CM | POA: Diagnosis not present

## 2020-07-26 DIAGNOSIS — Z6841 Body Mass Index (BMI) 40.0 and over, adult: Secondary | ICD-10-CM | POA: Diagnosis not present

## 2020-07-26 LAB — CBC
HCT: 22.4 % — ABNORMAL LOW (ref 39.0–52.0)
Hemoglobin: 7.4 g/dL — ABNORMAL LOW (ref 13.0–17.0)
MCH: 30.1 pg (ref 26.0–34.0)
MCHC: 33 g/dL (ref 30.0–36.0)
MCV: 91.1 fL (ref 80.0–100.0)
Platelets: 97 10*3/uL — ABNORMAL LOW (ref 150–400)
RBC: 2.46 MIL/uL — ABNORMAL LOW (ref 4.22–5.81)
RDW: 17.1 % — ABNORMAL HIGH (ref 11.5–15.5)
WBC: 5.4 10*3/uL (ref 4.0–10.5)
nRBC: 0 % (ref 0.0–0.2)

## 2020-07-26 LAB — COMPREHENSIVE METABOLIC PANEL
ALT: 23 U/L (ref 0–44)
AST: 30 U/L (ref 15–41)
Albumin: 2.5 g/dL — ABNORMAL LOW (ref 3.5–5.0)
Alkaline Phosphatase: 69 U/L (ref 38–126)
Anion gap: 11 (ref 5–15)
BUN: 105 mg/dL — ABNORMAL HIGH (ref 8–23)
CO2: 24 mmol/L (ref 22–32)
Calcium: 8.3 mg/dL — ABNORMAL LOW (ref 8.9–10.3)
Chloride: 100 mmol/L (ref 98–111)
Creatinine, Ser: 3.28 mg/dL — ABNORMAL HIGH (ref 0.61–1.24)
GFR, Estimated: 20 mL/min — ABNORMAL LOW (ref >60)
Glucose, Bld: 225 mg/dL — ABNORMAL HIGH (ref 70–99)
Potassium: 3.1 mmol/L — ABNORMAL LOW (ref 3.5–5.1)
Sodium: 135 mmol/L (ref 135–145)
Total Bilirubin: 1 mg/dL (ref 0.3–1.2)
Total Protein: 5.9 g/dL — ABNORMAL LOW (ref 6.5–8.1)

## 2020-07-26 LAB — HEMOGLOBIN AND HEMATOCRIT, BLOOD
HCT: 21.4 % — ABNORMAL LOW (ref 39.0–52.0)
HCT: 20.8 % — ABNORMAL LOW (ref 39.0–52.0)
Hemoglobin: 7 g/dL — ABNORMAL LOW (ref 13.0–17.0)
Hemoglobin: 6.6 g/dL — ABNORMAL LOW (ref 13.0–17.0)

## 2020-07-26 LAB — IRON AND TIBC
Iron: 82 ug/dL (ref 45–182)
Saturation Ratios: 23 % (ref 17.9–39.5)
TIBC: 353 ug/dL (ref 250–450)
UIBC: 271 ug/dL

## 2020-07-26 LAB — PROTIME-INR
INR: 1.3 — ABNORMAL HIGH (ref 0.8–1.2)
INR: 1.4 — ABNORMAL HIGH (ref 0.8–1.2)
Prothrombin Time: 16 seconds — ABNORMAL HIGH (ref 11.4–15.2)
Prothrombin Time: 16.4 seconds — ABNORMAL HIGH (ref 11.4–15.2)

## 2020-07-26 LAB — PHOSPHORUS: Phosphorus: 5.8 mg/dL — ABNORMAL HIGH (ref 2.5–4.6)

## 2020-07-26 LAB — FERRITIN: Ferritin: 32 ng/mL (ref 24–336)

## 2020-07-26 LAB — PREPARE RBC (CROSSMATCH)

## 2020-07-26 LAB — MAGNESIUM
Magnesium: 2.1 mg/dL (ref 1.7–2.4)
Magnesium: 2.2 mg/dL (ref 1.7–2.4)

## 2020-07-26 LAB — GLUCOSE, CAPILLARY
Glucose-Capillary: 141 mg/dL — ABNORMAL HIGH (ref 70–99)
Glucose-Capillary: 192 mg/dL — ABNORMAL HIGH (ref 70–99)
Glucose-Capillary: 213 mg/dL — ABNORMAL HIGH (ref 70–99)
Glucose-Capillary: 194 mg/dL — ABNORMAL HIGH (ref 70–99)

## 2020-07-26 LAB — TSH: TSH: 1.766 u[IU]/mL (ref 0.350–4.500)

## 2020-07-26 MED ORDER — POTASSIUM CHLORIDE 10 MEQ/100ML IV SOLN
10.0000 meq | INTRAVENOUS | Status: DC
  Administered 2020-07-26: 10 meq via INTRAVENOUS
  Filled 2020-07-26 (×2): qty 100

## 2020-07-26 MED ORDER — POTASSIUM CHLORIDE CRYS ER 20 MEQ PO TBCR
40.0000 meq | EXTENDED_RELEASE_TABLET | Freq: Three times a day (TID) | ORAL | Status: AC
  Administered 2020-07-26 – 2020-07-27 (×3): 40 meq via ORAL
  Filled 2020-07-26 (×3): qty 2

## 2020-07-26 MED ORDER — SODIUM CHLORIDE 0.9% IV SOLUTION: Freq: Once | INTRAVENOUS | Status: AC

## 2020-07-26 MED ORDER — LACTULOSE 10 GM/15ML PO SOLN
30.0000 g | Freq: Two times a day (BID) | ORAL | Status: DC
  Administered 2020-07-26 – 2020-07-28 (×5): 30 g via ORAL
  Administered 2020-07-29: 23:00:00 20 g via ORAL
  Administered 2020-07-29: 30 g via ORAL
  Filled 2020-07-26 (×8): qty 60

## 2020-07-26 NOTE — Progress Notes (Signed)
[facility]  ROUNDING NOTE   Subjective:  Patient well-known to [place] from the office. Presents with GI bleed as well as acute kidney injury. Has received blood transfusion this admission. Recently as an outpatient his diuretics were held. Baseline creatinine appears to be 2.4 Currently BUN 105 with a creatinine of 3.28.  Objective:  Vital signs in last 24 hours:  Temp:  [97.5 F (36.4 C)-98.6 F (37 C)] 98.4 F (36.9 C) (10/26 1116) Pulse Rate:  [59-74] 59 (10/26 1116) Resp:  [15-21] 18 (10/26 0850) BP: (118-152)/(42-86) 136/57 (10/26 1116) SpO2:  [97 %-100 %] 100 % (10/26 1116) Weight:  [706 kg] 137 kg (10/25 2345)  Weight change:  Filed Weights   07/25/20 1339 07/25/20 2345  Weight: 136.1 kg (!) 137 kg    Intake/Output: I/O last 3 completed shifts: In: 350 [Blood:350] Out: -    Intake/Output this shift:  Total I/O In: 330 [Blood:330] Out: -   Physical Exam: General:  No acute distress  Head:  Normocephalic, atraumatic. Moist oral mucosal membranes  Eyes:  Anicteric  Neck:  Supple  Lungs:   Diminished bilateral, normal effort  Heart:  S1S2 no rubs  Abdomen:   Soft, nontender, bowel sounds present  Extremities:  2+ peripheral edema, lower extremities wrapped  Neurologic:  Awake, alert, following commands  Skin:  Dependent rubor in bilateral lower extremities noted       Basic Metabolic Panel: Recent Labs  Lab 07/25/20 1344 07/26/20 0040 07/26/20 1143  NA 137  --  135  K 3.2*  --  3.1*  CL 101  --  100  CO2 24  --  24  GLUCOSE 157*  --  225*  BUN 113*  --  105*  CREATININE 3.39*  --  3.28*  CALCIUM 8.6*  --  8.3*  MG  --  2.1 2.2  PHOS  --   --  5.8*    Liver Function Tests: Recent Labs  Lab 07/25/20 1344 07/26/20 1143  AST 35 30  ALT 25 23  ALKPHOS 80 69  BILITOT 0.8 1.0  PROT 6.4* 5.9*  ALBUMIN 2.7* 2.5*   No results for input(s): LIPASE, AMYLASE in the last 168 hours. No results for input(s): AMMONIA in the last 168  hours.  CBC: Recent Labs  Lab 07/25/20 0040 07/25/20 1344 07/26/20 0040 07/26/20 1143  WBC  --  5.5  --  5.4  HGB 7.0* 6.8* 6.6* 7.4*  HCT 21.4* 20.7* 20.8* 22.4*  MCV  --  92.8  --  91.1  PLT  --  111*  --  97*    Cardiac Enzymes: No results for input(s): CKTOTAL, CKMB, CKMBINDEX, TROPONINI in the last 168 hours.  BNP: Invalid input(s): POCBNP  CBG: Recent Labs  Lab 07/25/20 2339 07/26/20 0650 07/26/20 1115  GLUCAP 192* 141* 213*    Microbiology: Results for orders placed or performed during the hospital encounter of 07/25/20  Respiratory Panel by RT PCR (Flu A&B, Covid) - Nasopharyngeal Swab     Status: None   Collection Time: 07/25/20  6:22 PM   Specimen: Nasopharyngeal Swab  Result Value Ref Range Status   SARS Coronavirus 2 by RT PCR NEGATIVE NEGATIVE Final    Comment: (NOTE) SARS-CoV-2 target nucleic acids are NOT DETECTED.  The SARS-CoV-2 RNA is generally detectable in upper respiratoy specimens during the acute phase of infection. The lowest concentration of SARS-CoV-2 viral copies this assay can detect is 131 copies/mL. A negative result does not preclude SARS-Cov-2  infection and should not be used as the sole basis for treatment or other patient management decisions. A negative result may occur with  improper specimen collection/handling, submission of specimen other than nasopharyngeal swab, presence of viral mutation(s) within the areas targeted by this assay, and inadequate number of viral copies (<131 copies/mL). A negative result must be combined with clinical observations, patient history, and epidemiological information. The expected result is Negative.  Fact Sheet for Patients:  PinkCheek.be  Fact Sheet for Healthcare Providers:  GravelBags.it  This test is no t yet approved or cleared by the [place] FDA and  has been authorized for detection and/or diagnosis of SARS-CoV-2  by FDA under an Emergency Use Authorization (EUA). This EUA will remain  in effect (meaning this test can be used) for the duration of the COVID-19 declaration under Section 564(b)(1) of the Act, 21 U.S.C. section 360bbb-3(b)(1), unless the authorization is terminated or revoked sooner.     Influenza A by PCR NEGATIVE NEGATIVE Final   Influenza B by PCR NEGATIVE NEGATIVE Final    Comment: (NOTE) The Xpert Xpress SARS-CoV-2/FLU/RSV assay is intended as an aid in  the diagnosis of influenza from Nasopharyngeal swab specimens and  should not be used as a sole basis for treatment. Nasal washings and  aspirates are unacceptable for Xpert Xpress SARS-CoV-2/FLU/RSV  testing.  Fact Sheet for Patients: PinkCheek.be  Fact Sheet for Healthcare Providers: GravelBags.it  This test is not yet approved or cleared by the [place] FDA and  has been authorized for detection and/or diagnosis of SARS-CoV-2 by  FDA under an Emergency Use Authorization (EUA). This EUA will remain  in effect (meaning this test can be used) for the duration of the  Covid-19 declaration under Section 564(b)(1) of the Act, 21  U.S.C. section 360bbb-3(b)(1), unless the authorization is  terminated or revoked. Performed at [facility], [address] [place], [place] [address]     Coagulation Studies: Recent Labs    07/26/20 0040 07/26/20 1143  LABPROT 16.0* 16.4*  INR 1.3* 1.4*    Urinalysis: No results for input(s): COLORURINE, LABSPEC, PHURINE, GLUCOSEU, HGBUR, BILIRUBINUR, KETONESUR, PROTEINUR, UROBILINOGEN, NITRITE, LEUKOCYTESUR in the last 72 hours.  Invalid input(s): APPERANCEUR    Imaging: DG Chest 2 View  Result Date: 07/25/2020 CLINICAL DATA:  Shortness of breath EXAM: CHEST - 2 VIEW COMPARISON:  07/07/2020 FINDINGS: Low lung volumes. Pulmonary vascular congestion. No pleural effusion or pneumothorax. Cardiomegaly. No acute  osseous abnormality. IMPRESSION: Cardiomegaly and pulmonary vascular congestion similar to prior study. Electronically Signed   By: Macy Mis M.D.   On: 07/25/2020 14:47     Medications:   . sodium chloride     . insulin aspart  0-9 Units Subcutaneous Q6H  . insulin detemir  5 Units Subcutaneous BID  . lactulose  30 g Oral BID  . pantoprazole (PROTONIX) IV  40 mg Intravenous Q12H  . rifaximin  550 mg Oral BID   LORazepam  Assessment/ Plan:  69 y.o. male with past medical history of cirrhosis, esophageal varices, iron deficiency anemia, chronic venous stasis of both lower extremities, morbid obesity, chronic kidney disease stage IV, COPD, anemia of chronic kidney disease, secondary hyperparathyroidism who presented with GI bleed and acute kidney injury.  1.  Acute kidney injury/chronic kidney disease stage IV baseline creatinine 2.4.  Suspect that acute kidney injury now related to acute blood loss.  Holding off on IV fluids given total body volume overload.  He may have some element of  intravascular volume depletion which is being repleted with blood products.  No immediate need for dialysis.  Avoid nephrotoxins as possible.  2.  Anemia of chronic kidney disease/GI bleed.  Hemoglobin up to 7.4 posttransfusion.  Agree with gastroenterology evaluation.  3.  Hypokalemia.  Administer potassium chloride 40 mEq IV today.   LOS: 0 Mahalie Kanner 10/26/20212:00 PM

## 2020-07-26 NOTE — Consult Note (Signed)
I was consulted on this patient for worsening of anemia.  When I went to see the patient, he insisted to be seen by [facility] clinic GI only.  I informed him that Dr. [name] is out of town.  Patient requested to be seen by his assistant.  Therefore, I informed Dr. Haig Prophet about his request.  Dr. Haig Prophet agreed to see the patient  Cephas Darby, MD [address]  [place]  [place], [place] [address]  Main: [phone]  Fax: [phone] Pager: [phone]

## 2020-07-26 NOTE — Consult Note (Signed)
WOC Nurse Consult Note: Reason for Consult: blisters on LE Patient followed by [facility] and MD for compression therapy and topical wound care.  Presents with Beverlee Nims' boots bilaterally that do not have the heel enclosed and have loosened and fallen down the leg a bit.  Patient self reports he has them changed 2x wk.  Wound type: venous stasis ulcerations x 2 LLE Pressure Injury POA: NA Measurement: Lateral calf: 4cm x 4cm x 0.2cm; 100% beefy red Medial proximal calf: 0.5cm x 3cm x 0.1cm; 100% beefy red Wound bed: see above Drainage (amount, consistency, odor) minimal; strike through from last [employer] application on the LLE  Periwound: redness but reported to be normal; not darkened like hemosiderin staining however. Noted erythema is worse on the LLE with extension on the left posterior thigh. Notified MD of this via secure chat Dressing procedure/placement/frequency: Cleansed legs with water, dried Apply calcium alginate to the open ulcerations of the LLE.  Unna's boots applied bilaterally. Patient tolerated well.  Will plan Q Tuesday/Friday changes while inpatient  Follow up for management with [facility] and MD after DC.   [place], [place], [place]

## 2020-07-26 NOTE — Progress Notes (Signed)
PROGRESS NOTE  Corey West ZOX:[id number] DOB: Oct 11, 1950 DOA: 07/25/2020 PCP: Corey Ramsay, MD   LOS: 0 days   Brief narrative: As per HPI,  Corey West is a 69 y.o. 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  He does have baseline shortness of breath without recent worsening.  Patient follows with Dr. Candiss Norse as his outpatient nephrologist.    Patient was recently taken off his Lasix due to trending up creatinine.  Patient has a history of upper GI bleed, EGD performed by Dr. 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  Over the last 3 to 4 days he also reports mild lightheadedness and dizziness in the absence of any presyncope or syncope.  No recent trauma.  Denies any alcohol consumption over the last 15 years.  In the ED, he was mildly tachypneic.  Hemoglobin was 6.8 with MCV of 93.  Platelet was 111.  COVID-19 and flu was negative.  Chest x-ray showed stable cardiomegaly.  Oncology and was consulted and patient was considered for admission to the hospital.  On-call nephrology was also notified. While in the ED, the following were administered: Protonix 40 mg IV x1.  Ativan 0.5 mg p.o. x1.  Patient was then admitted to hospital for proper evaluation and treatment.   Assessment/Plan:  Principal Problem:   Acute on chronic anemia Active Problems:   Cirrhosis ([place])   Acute kidney injury superimposed on CKD  (HCC)   Type 2 diabetes mellitus with diabetic nephropathy, with long-term current use of insulin (HCC)   Chronic diastolic CHF (congestive heart failure) (HCC)   Generalized weakness  Acute on chronic anemia: Patient does have history of GI bleed in the past.   Likely subacute/chronic GI bleed with underlying chronic liver disease.  History of multiple EGD and capsule endoscopy.  Last endoscopy on 07/18/2020.  Not on any blood thinners.  As per admitting provider, no recommendations were given for Protonix drip, octreotide or SBP prophylaxis at this time nausea.  Iron profile shows low ferritin and saturation.  We will continue Protonix 40 mg IV twice daily.  Follow GI recommendations.  Acute kidney injury on stage IV chronic kidney disease: baseline creatinine of 2.7-2.9, presenting creatinine noted to be 3.39.  We will continue to monitor.  Nephrology has been notified.  Off diuretics at this time.  Received packed RBC.  Continue to monitor closely.  Strict intake &.  Avoid nephrotoxic agents.  Follow nephrology recommendations.  Generalized weakness: Likely multifactorial .  Status post 2 units PRBC transfusion.  Check PT evaluation.   Cirrhosis of liver with history of portal hypertension esophageal varices and hepatic encephalopathy.  Lasix was discontinued recently due to rising creatinine levels.  He is not on spironolactone.  Was on lactulose and rifaximin.  INR of 1.3.   Chronic diastolic heart failure: Echocardiogram done on July 2021 with LVEF 50 to 55%, no evidence of focal wall motion abnormalities, grade 2 diastolic dysfunction.    Chest x-ray with stable cardiomegaly and mild pulmonary vascular congestion.  Off Lasix at this  time.  Will need to closely monitor.  Type 2 diabetes mellitus with diabetic nephropathy.  on  NovoLog 70/30 as outpatient.  On Levemir while in the hospital with sliding scale insulin and Accu-Cheks.  Generalized anxiety disorder: Continue as needed  Ativan.  BPH: On Flomax   DVT prophylaxis: SCD   Code Status: Full code  Family Communication: none  Status is: Observation  The patient will require care spanning > 2 midnights and should be moved to inpatient because: IV treatments appropriate due to intensity of illness or inability to take PO, Inpatient level of care appropriate due to severity of illness and Possible GI work-up  Dispo: The patient is from: Home              Anticipated d/c is to: Home              Anticipated d/c date is: 2 days.  Patient lives by himself at home.  Will need PT evaluation prior to discharge.  Follow GI recommendations              Patient currently is not medically stable to d/c.   Consultants:  GI  Nephrology  Procedures:  PRBC transfusion II units  Antibiotics:  . Rifaximin  Anti-infectives (From admission, onward)   Start     Dose/Rate Route Frequency Ordered Stop   07/26/20 1000  rifaximin (XIFAXAN) tablet 550 mg        550 mg Oral 2 times daily 07/25/20 2100       Subjective: Today, patient was seen and examined at bedside.  Patient denies any chest pain, shortness of breath, fever chills or rigor.  Denies any rectal bleeding.  No nausea vomiting or abdominal pain.  Feels little more energetic today after blood transfusion.  Objective: Vitals:   07/26/20 0556 07/26/20 0623  BP: (!) 143/86 (!) 135/50  Pulse: 63 61  Resp: 17 16  Temp: (!) 97.5 F (36.4 C) 97.9 F (36.6 C)  SpO2: 100% 100%    Intake/Output Summary (Last 24 hours) at 07/26/2020 0718 Last data filed at 07/25/2020 2021 Gross per 24 hour  Intake 350 ml  Output --  Net 350 ml   Filed Weights   07/25/20 1339 07/25/20 2345  Weight: 136.1 kg (!) 137 kg   Body mass index is 44.61 kg/m.   Physical Exam:  GENERAL: Patient is alert awake and oriented. Not in obvious distress.  Morbidly obese, on nasal cannula oxygen HENT: Mild pallor noted, pupils equally reactive to light. Oral mucosa is  moist NECK: is supple, no gross swelling noted. CHEST: Diminished breath sounds bilaterally. CVS: S1 and S2 heard, no murmur. Regular rate and rhythm.  ABDOMEN: Soft, non-tender, obese abdomen with abdominal wall edema bowel sounds are present.  Scrotal edema noted. EXTREMITIES: Bilateral lower extremity pitting edema with bilateral Unna boot, CNS: Cranial nerves are intact. No focal motor deficits. SKIN: warm and dry without rashes.  Data Review: I have personally reviewed the following laboratory data and studies,  CBC: Recent Labs  Lab 07/25/20 0040 07/25/20 1344 07/26/20 0040  WBC  --  5.5  --   HGB 7.0* 6.8* 6.6*  HCT 21.4* 20.7* 20.8*  MCV  --  92.8  --   PLT  --  111*  --    Basic Metabolic Panel: Recent Labs  Lab 07/25/20 1344 07/26/20 0040  NA 137  --   K 3.2*  --   CL 101  --   CO2 24  --  GLUCOSE 157*  --   BUN 113*  --   CREATININE 3.39*  --   CALCIUM 8.6*  --   MG  --  2.1   Liver Function Tests: Recent Labs  Lab 07/25/20 1344  AST 35  ALT 25  ALKPHOS 80  BILITOT 0.8  PROT 6.4*  ALBUMIN 2.7*   No results for input(s): LIPASE, AMYLASE in the last 168 hours. No results for input(s): AMMONIA in the last 168 hours. Cardiac Enzymes: No results for input(s): CKTOTAL, CKMB, CKMBINDEX, TROPONINI in the last 168 hours. BNP (last 3 results) Recent Labs    06/08/20 0915 06/09/20 0600 07/07/20 0603  BNP 343.3* 397.7* 243.7*    ProBNP (last 3 results) No results for input(s): PROBNP in the last 8760 hours.  CBG: Recent Labs  Lab 07/26/20 0650  GLUCAP 141*   Recent Results (from the past 240 hour(s))  Respiratory Panel by RT PCR (Flu A&B, Covid) - Nasopharyngeal Swab     Status: None   Collection Time: 07/25/20  6:22 PM   Specimen: Nasopharyngeal Swab  Result Value Ref Range Status   SARS Coronavirus 2 by RT PCR NEGATIVE NEGATIVE Final    Comment: (NOTE) SARS-CoV-2 target nucleic acids are NOT DETECTED.  The SARS-CoV-2 RNA is generally  detectable in upper respiratoy specimens during the acute phase of infection. The lowest concentration of SARS-CoV-2 viral copies this assay can detect is 131 copies/mL. A negative result does not preclude SARS-Cov-2 infection and should not be used as the sole basis for treatment or other patient management decisions. A negative result may occur with  improper specimen collection/handling, submission of specimen other than nasopharyngeal swab, presence of viral mutation(s) within the areas targeted by this assay, and inadequate number of viral copies (<131 copies/mL). A negative result must be combined with clinical observations, patient history, and epidemiological information. The expected result is Negative.  Fact Sheet for Patients:  PinkCheek.be  Fact Sheet for Healthcare Providers:  GravelBags.it  This test is no t yet approved or cleared by the [place] FDA and  has been authorized for detection and/or diagnosis of SARS-CoV-2 by FDA under an Emergency Use Authorization (EUA). This EUA will remain  in effect (meaning this test can be used) for the duration of the COVID-19 declaration under Section 564(b)(1) of the Act, 21 U.S.C. section 360bbb-3(b)(1), unless the authorization is terminated or revoked sooner.     Influenza A by PCR NEGATIVE NEGATIVE Final   Influenza B by PCR NEGATIVE NEGATIVE Final    Comment: (NOTE) The Xpert Xpress SARS-CoV-2/FLU/RSV assay is intended as an aid in  the diagnosis of influenza from Nasopharyngeal swab specimens and  should not be used as a sole basis for treatment. Nasal washings and  aspirates are unacceptable for Xpert Xpress SARS-CoV-2/FLU/RSV  testing.  Fact Sheet for Patients: PinkCheek.be  Fact Sheet for Healthcare Providers: GravelBags.it  This test is not yet approved or cleared by the [place] FDA and   has been authorized for detection and/or diagnosis of SARS-CoV-2 by  FDA under an Emergency Use Authorization (EUA). This EUA will remain  in effect (meaning this test can be used) for the duration of the  Covid-19 declaration under Section 564(b)(1) of the Act, 21  U.S.C. section 360bbb-3(b)(1), unless the authorization is  terminated or revoked. Performed at [facility], [address] [place], [place] [address]      Studies: DG Chest 2 View  Result Date: 07/25/2020 CLINICAL DATA:  Shortness of  breath EXAM: CHEST - 2 VIEW COMPARISON:  07/07/2020 FINDINGS: Low lung volumes. Pulmonary vascular congestion. No pleural effusion or pneumothorax. Cardiomegaly. No acute osseous abnormality. IMPRESSION: Cardiomegaly and pulmonary vascular congestion similar to prior study. Electronically Signed   By: Macy Mis M.D.   On: 07/25/2020 14:47      Flora Lipps, MD  [facility] 07/26/2020

## 2020-07-26 NOTE — Consult Note (Signed)
Consultation  Referring Provider:    ED Admit date: 07/25/2020 Consult date: 07/26/2020         Reason for Consultation:     Anemia         HPI:   Corey West is a 69 y.o. 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 He has a complicated GI history including low grade-dysplasia in BE's recently diagnosed, moderate portal hypertensive gastropathy, GAVE, and a main branch IPMN. He presents with hemoglobin of 6.8 but his placement hemoglobin is 7-8 with last hemoglobin about 7 two weeks ago. He has been having issues with fatigue which is likely due to volume overload from cirrhosis and CKD. He had an EGD one week prior with portal hypertensive gastropathy and GAVE which was treated. He denies any overt GI bleeding such as melena or hematochezia. He lives alone at home. He does have history of HE but he has a large splenorenal shunt on imaging which is likely why no esophageal varices have been seen on his past few EGD's.  Past Medical History:  Diagnosis Date  . A-fib ([place])   . Anemia   . Anxiety    controlled;   . Arthritis   . AVM (arteriovenous malformation) of stomach, acquired with hemorrhage   . Barrett's esophagus   . CHF (congestive heart failure) ([place])   . Chronic kidney disease    renal infufficiency  . Cirrhosis ([place])   . COPD (chronic obstructive pulmonary disease) ([place])   . Depression    controlled;   Marland Kitchen Diabetes mellitus without complication ([place])    not controlled, taking insulin but sugar continues to run high;   Marland Kitchen Dysrhythmia   . Edema   . Esophageal varices ([place])   . GAVE (gastric antral vascular ectasia)   . GERD (gastroesophageal reflux disease)   . Hepatic encephalopathy ([place])   . History of hiatal hernia   . Hyperlipidemia   . Hypertension    controlled well;   . IPMN (intraductal papillary mucinous neoplasm)   . Liver cirrhosis ([place])   . Nephropathy, diabetic ([place])   . Obesity   . Pancytopenia  ([place])   . Polyp, stomach    with chronic blood loss  . Portal hypertensive gastropathy ([place])   . Sleep apnea    does not wear a cpap, Medicare would not pay for it   . Venous stasis dermatitis of both lower extremities   . Venous stasis of both lower extremities    with cellulitis    Past Surgical History:  Procedure Laterality Date  . ESOPHAGOGASTRODUODENOSCOPY N/A 02/09/2015   Procedure: ESOPHAGOGASTRODUODENOSCOPY (EGD);  Surgeon: Manya Silvas, MD;  Location[facility] ENDOSCOPY;  Service: Endoscopy;  Laterality: N/A;  . ESOPHAGOGASTRODUODENOSCOPY N/A 07/22/2015   Procedure: ESOPHAGOGASTRODUODENOSCOPY (EGD);  Surgeon: Manya Silvas, MD;  Location: [facility] ENDOSCOPY;  Service: Endoscopy;  Laterality: N/A;  . ESOPHAGOGASTRODUODENOSCOPY N/A 06/26/2017   Procedure: ESOPHAGOGASTRODUODENOSCOPY (EGD);  Surgeon: Manya Silvas, MD;  Location: [facility] ENDOSCOPY;  Service: Endoscopy;  Laterality: N/A;  . ESOPHAGOGASTRODUODENOSCOPY N/A 06/27/2017   Procedure: ESOPHAGOGASTRODUODENOSCOPY (EGD);  Surgeon: Manya Silvas, MD;  Location: [facility] ENDOSCOPY;  Service: Endoscopy;  Laterality: N/A;  . ESOPHAGOGASTRODUODENOSCOPY N/A 06/28/2017   Procedure: ESOPHAGOGASTRODUODENOSCOPY (EGD);  Surgeon: Manya Silvas, MD;  Location: [facility] ENDOSCOPY;  Service: Endoscopy;  Laterality: N/A;  . ESOPHAGOGASTRODUODENOSCOPY Bilateral 10/11/2017   Procedure: ESOPHAGOGASTRODUODENOSCOPY (EGD);  Surgeon: Manya Silvas, MD;  Location: [facility] ENDOSCOPY;  Service:  Endoscopy;  Laterality: Bilateral;  . ESOPHAGOGASTRODUODENOSCOPY N/A 12/04/2017   Procedure: ESOPHAGOGASTRODUODENOSCOPY (EGD);  Surgeon: Manya Silvas, MD;  Location: [facility] ENDOSCOPY;  Service: Endoscopy;  Laterality: N/A;  . ESOPHAGOGASTRODUODENOSCOPY N/A 03/06/2019   Procedure: ESOPHAGOGASTRODUODENOSCOPY (EGD);  Surgeon: Toledo, Benay Pike, MD;  Location: [facility] ENDOSCOPY;  Service: Gastroenterology;  Laterality: N/A;  . ESOPHAGOGASTRODUODENOSCOPY (EGD) WITH PROPOFOL  N/A 07/20/2015   Procedure: ESOPHAGOGASTRODUODENOSCOPY (EGD) WITH PROPOFOL;  Surgeon: Manya Silvas, MD;  Location: [facility] ENDOSCOPY;  Service: Endoscopy;  Laterality: N/A;  . ESOPHAGOGASTRODUODENOSCOPY (EGD) WITH PROPOFOL N/A 09/16/2015   Procedure: ESOPHAGOGASTRODUODENOSCOPY (EGD) WITH PROPOFOL;  Surgeon: Manya Silvas, MD;  Location: [facility] ENDOSCOPY;  Service: Endoscopy;  Laterality: N/A;  . ESOPHAGOGASTRODUODENOSCOPY (EGD) WITH PROPOFOL N/A 03/16/2016   Procedure: ESOPHAGOGASTRODUODENOSCOPY (EGD) WITH PROPOFOL;  Surgeon: Manya Silvas, MD;  Location: [facility] ENDOSCOPY;  Service: Endoscopy;  Laterality: N/A;  . ESOPHAGOGASTRODUODENOSCOPY (EGD) WITH PROPOFOL N/A 09/14/2016   Procedure: ESOPHAGOGASTRODUODENOSCOPY (EGD) WITH PROPOFOL;  Surgeon: Manya Silvas, MD;  Location: [facility] ENDOSCOPY;  Service: Endoscopy;  Laterality: N/A;  . ESOPHAGOGASTRODUODENOSCOPY (EGD) WITH PROPOFOL N/A 11/05/2016   Procedure: ESOPHAGOGASTRODUODENOSCOPY (EGD) WITH PROPOFOL;  Surgeon: Manya Silvas, MD;  Location: [facility] ENDOSCOPY;  Service: Endoscopy;  Laterality: N/A;  . ESOPHAGOGASTRODUODENOSCOPY (EGD) WITH PROPOFOL N/A 02/06/2017   Procedure: ESOPHAGOGASTRODUODENOSCOPY (EGD) WITH PROPOFOL;  Surgeon: Manya Silvas, MD;  Location: [facility] ENDOSCOPY;  Service: Endoscopy;  Laterality: N/A;  . ESOPHAGOGASTRODUODENOSCOPY (EGD) WITH PROPOFOL N/A 04/24/2017   Procedure: ESOPHAGOGASTRODUODENOSCOPY (EGD) WITH PROPOFOL;  Surgeon: Manya Silvas, MD;  Location: [facility] ENDOSCOPY;  Service: Endoscopy;  Laterality: N/A;  . ESOPHAGOGASTRODUODENOSCOPY (EGD) WITH PROPOFOL N/A 07/31/2017   Procedure: ESOPHAGOGASTRODUODENOSCOPY (EGD) WITH PROPOFOL;  Surgeon: Manya Silvas, MD;  Location: [facility] ENDOSCOPY;  Service: Endoscopy;  Laterality: N/A;  . ESOPHAGOGASTRODUODENOSCOPY (EGD) WITH PROPOFOL N/A 08/08/2018   Procedure: ESOPHAGOGASTRODUODENOSCOPY (EGD) WITH PROPOFOL;  Surgeon: Manya Silvas, MD;  Location: [facility] ENDOSCOPY;   Service: Endoscopy;  Laterality: N/A;  . ESOPHAGOGASTRODUODENOSCOPY (EGD) WITH PROPOFOL N/A 05/21/2020   Procedure: ESOPHAGOGASTRODUODENOSCOPY (EGD) WITH PROPOFOL;  Surgeon: [name] Manifold, MD;  Location: [facility] ENDOSCOPY;  Service: Endoscopy;  Laterality: N/A;  . ESOPHAGOGASTRODUODENOSCOPY (EGD) WITH PROPOFOL N/A 07/18/2020   Procedure: ESOPHAGOGASTRODUODENOSCOPY (EGD) WITH PROPOFOL;  Surgeon: Toledo, Benay Pike, MD;  Location: [facility] ENDOSCOPY;  Service: Gastroenterology;  Laterality: N/A;  . GIVENS CAPSULE STUDY N/A 12/05/2017   Procedure: GIVENS CAPSULE STUDY;  Surgeon: Manya Silvas, MD;  Location: [facility] ENDOSCOPY;  Service: Endoscopy;  Laterality: N/A;  . TONSILLECTOMY    . TONSILLECTOMY AND ADENOIDECTOMY    . ULNAR NERVE TRANSPOSITION    . UVULOPALATOPHARYNGOPLASTY      Family History  Problem Relation Age of Onset  . Diabetes Other   . Transient ischemic attack Father   . CAD Father     Social History   Tobacco Use  . Smoking status: Former Smoker    Years: 20.00    Types: Cigars, Cigarettes  . Smokeless tobacco: Never Used  Vaping Use  . Vaping Use: Never used  Substance Use Topics  . Alcohol use: No    Comment: stopped 15 years ago  . Drug use: No    Prior to Admission medications   Medication Sig Start Date End Date Taking? Authorizing Provider  atorvastatin (LIPITOR) 20 MG tablet Take 20 mg by mouth daily.   Yes [provider]  ferrous sulfate 325 (65 FE) MG tablet Take 1 tablet (325 mg total) by mouth daily. This medication can cause  black stools 06/11/20  Yes Wyvonnia Dusky, MD  hydrALAZINE (APRESOLINE) 50 MG tablet Take 1 tablet (50 mg total) by mouth 2 (two) times daily. 05/03/20  Yes Nicole Kindred A, DO  lactulose (CHRONULAC) 10 GM/15ML solution Take 90 mLs (60 g total) by mouth daily. Patient taking differently: Take 45 g by mouth 2 (two) times daily.  05/04/20  Yes Nicole Kindred A, DO  LORazepam (ATIVAN) 0.5 MG tablet Take 1 tablet (0.5 mg  total) by mouth 3 (three) times daily as needed for Anxiety   Yes [provider]  NOVOLOG MIX 70/30 FLEXPEN (70-30) 100 UNIT/ML FlexPen 0-84 Units as directed. Inject 0 - 84 Units (sliding scale) Subcutaneously twice a day before meals, at breakfast and at dinner. 03/17/20  Yes [provider]  pantoprazole (PROTONIX) 40 MG tablet Take 1 tablet (40 mg total) by mouth 2 (two) times daily. 03/06/19 07/25/20 Yes Dustin Flock, MD  potassium chloride SA (KLOR-CON) 20 MEQ tablet Take 20 mEq by mouth 2 (two) times daily. 07/06/20  Yes [provider]  rifaximin (XIFAXAN) 550 MG TABS tablet Take 1 tablet (550 mg total) by mouth 2 (two) times daily. 02/12/20  Yes Wieting, Richard, MD  tamsulosin (FLOMAX) 0.4 MG CAPS capsule Take 1 capsule (0.4 mg total) by mouth daily after supper. 05/03/20  Yes Nicole Kindred A, DO  atorvastatin (LIPITOR) 10 MG tablet Take 20 mg by mouth every evening.  03/23/20   [provider]  Ipratropium-Albuterol (COMBIVENT RESPIMAT) 20-100 MCG/ACT AERS respimat Inhale 1 puff into the lungs every 6 (six) hours. Patient not taking: Reported on 07/25/2020 02/12/20   Loletha Grayer, MD  metolazone (ZAROXOLYN) 5 MG tablet Take 5 mg by mouth daily. . 05/13/20   [provider]  sodium chloride (OCEAN) 0.65 % SOLN nasal spray Place 1 spray into both nostrils as needed for congestion (nose irritation). 02/12/20   Loletha Grayer, MD  torsemide (DEMADEX) 100 MG tablet Take 1 tablet (100 mg total) by mouth daily. 05/04/20   Ezekiel Slocumb, DO    Current Facility-Administered Medications  Medication Dose Route Frequency Provider Last Rate Last Admin  . 0.9 %  sodium chloride infusion  10 mL/hr Intravenous Once Duffy Bruce, MD      . insulin aspart (novoLOG) injection 0-9 Units  0-9 Units Subcutaneous Q6H Howerter, Justin B, DO   3 Units at 07/26/20 1319  . insulin detemir (LEVEMIR) injection 5 Units  5 Units Subcutaneous BID Howerter, Justin B, DO    5 Units at 07/26/20 1318  . lactulose (CHRONULAC) 10 GM/15ML solution 30 g  30 g Oral BID Pokhrel, Laxman, MD   30 g at 07/26/20 1425  . LORazepam (ATIVAN) tablet 0.5 mg  0.5 mg Oral TID PRN Howerter, Justin B, DO      . pantoprazole (PROTONIX) injection 40 mg  40 mg Intravenous Q12H Howerter, Justin B, DO   40 mg at 07/26/20 0821  . potassium chloride SA (KLOR-CON) CR tablet 40 mEq  40 mEq Oral TID Pokhrel, Laxman, MD      . rifaximin (XIFAXAN) tablet 550 mg  550 mg Oral BID Howerter, Justin B, DO   550 mg at 07/26/20 0820    Allergies as of 07/25/2020  . (No Known Allergies)     Review of Systems:    All systems reviewed and negative except where noted in HPI.  Review of Systems  Constitutional: Positive for malaise/fatigue. Negative for fever and weight loss.  Respiratory: Positive for shortness  of breath.   Cardiovascular: Negative for chest pain.  Gastrointestinal: Positive for constipation. Negative for abdominal pain, blood in stool, diarrhea, melena, nausea and vomiting.  Musculoskeletal: Positive for joint pain.  Neurological: Positive for weakness.  Psychiatric/Behavioral: Negative for substance abuse.  All other systems reviewed and are negative.     Physical Exam:  Vital signs in last 24 hours: Temp:  [97.5 F (36.4 C)-98.6 F (37 C)] 98.4 F (36.9 C) (10/26 1116) Pulse Rate:  [59-74] 59 (10/26 1116) Resp:  [15-21] 18 (10/26 0850) BP: (118-152)/(42-86) 136/57 (10/26 1116) SpO2:  [97 %-100 %] 100 % (10/26 1116) Weight:  [425 kg] 137 kg (10/25 2345) Last BM Date: 07/25/20 General:   Pleasant in NAD Head:  Normocephalic and atraumatic. Eyes:   No icterus.   Conjunctiva pink. Mouth: Moist mucosa Neck:  Supple; no masses felt Lungs:  No respiratory distress Heart:  RRR Abdomen:   Distended and obese Rectal:  No melena present Msk: Edematous lower extremities Neurologic:  Alert and  oriented x4;   Psych:  Alert and cooperative. Normal affect.  LAB  RESULTS: Recent Labs    07/25/20 1344 07/26/20 0040 07/26/20 1143  WBC 5.5  --  5.4  HGB 6.8* 6.6* 7.4*  HCT 20.7* 20.8* 22.4*  PLT 111*  --  97*   BMET Recent Labs    07/25/20 1344 07/26/20 1143  NA 137 135  K 3.2* 3.1*  CL 101 100  CO2 24 24  GLUCOSE 157* 225*  BUN 113* 105*  CREATININE 3.39* 3.28*  CALCIUM 8.6* 8.3*   LFT Recent Labs    07/26/20 1143  PROT 5.9*  ALBUMIN 2.5*  AST 30  ALT 23  ALKPHOS 69  BILITOT 1.0   PT/INR Recent Labs    07/26/20 0040 07/26/20 1143  LABPROT 16.0* 16.4*  INR 1.3* 1.4*    STUDIES: DG Chest 2 View  Result Date: 07/25/2020 CLINICAL DATA:  Shortness of breath EXAM: CHEST - 2 VIEW COMPARISON:  07/07/2020 FINDINGS: Low lung volumes. Pulmonary vascular congestion. No pleural effusion or pneumothorax. Cardiomegaly. No acute osseous abnormality. IMPRESSION: Cardiomegaly and pulmonary vascular congestion similar to prior study. Electronically Signed   By: Macy Mis M.D.   On: 07/25/2020 14:47       Impression / Plan:   69 y/o with likely NASH cirrhosis and history of portal hypertensive gastropathy plus GAVE with recent treatment with APC about one week ago along with acute on chronic kidney injury without overt signs of GI bleeding although he is likely chronically oozing due to portal hypertensive gastropathy along with GAVE but iron studies were normal (unclear when he received blood transfusion in relation to these being drawn)  - maintain active type and screen - trend CBC's daily - transfuse if hemoglobin < 7 - continue lactulose and xifaxin - continue oral PPI due to history of BE's with low grade dysplasia - will plan on EGD/Colonoscopy on Thursday - ok for soft diet tonight but place on clear liquids tomorrow - check daily CMP and INR  We will continue to follow along, please call with any questions.  Raylene Miyamoto MD, MPH [place]

## 2020-07-27 DIAGNOSIS — D649 Anemia, unspecified: Secondary | ICD-10-CM | POA: Diagnosis not present

## 2020-07-27 LAB — GLUCOSE, CAPILLARY
Glucose-Capillary: 191 mg/dL — ABNORMAL HIGH (ref 70–99)
Glucose-Capillary: 200 mg/dL — ABNORMAL HIGH (ref 70–99)
Glucose-Capillary: 201 mg/dL — ABNORMAL HIGH (ref 70–99)
Glucose-Capillary: 192 mg/dL — ABNORMAL HIGH (ref 70–99)
Glucose-Capillary: 248 mg/dL — ABNORMAL HIGH (ref 70–99)

## 2020-07-27 LAB — BASIC METABOLIC PANEL
Anion gap: 10 (ref 5–15)
BUN: 106 mg/dL — ABNORMAL HIGH (ref 8–23)
CO2: 24 mmol/L (ref 22–32)
Calcium: 8.2 mg/dL — ABNORMAL LOW (ref 8.9–10.3)
Chloride: 102 mmol/L (ref 98–111)
Creatinine, Ser: 3.61 mg/dL — ABNORMAL HIGH (ref 0.61–1.24)
GFR, Estimated: 18 mL/min — ABNORMAL LOW (ref >60)
Glucose, Bld: 212 mg/dL — ABNORMAL HIGH (ref 70–99)
Potassium: 3.8 mmol/L (ref 3.5–5.1)
Sodium: 136 mmol/L (ref 135–145)

## 2020-07-27 LAB — CALCIUM, IONIZED: Calcium, Ionized, Serum: 4.7 mg/dL (ref 4.5–5.6)

## 2020-07-27 MED ORDER — ONDANSETRON HCL 4 MG/2ML IJ SOLN
4.0000 mg | Freq: Four times a day (QID) | INTRAMUSCULAR | Status: DC | PRN
  Administered 2020-07-27: 07:00:00 4 mg via INTRAVENOUS
  Filled 2020-07-27: qty 2

## 2020-07-27 MED ORDER — ALBUMIN HUMAN 25 % IV SOLN
25.0000 g | Freq: Two times a day (BID) | INTRAVENOUS | Status: DC
  Administered 2020-07-27 – 2020-07-28 (×3): 25 g via INTRAVENOUS
  Filled 2020-07-27 (×4): qty 100

## 2020-07-27 NOTE — Progress Notes (Signed)
[facility]  ROUNDING NOTE   Subjective:  Patient found sleeping in bed today, appears lethargic. He reports that he is not getting much sleep in hospital, so feeling tired. He denies worsening shortness of breath, however increased work of breathing noted while he is talking with me.  Objective:  Vital signs in last 24 hours:  Temp:  [98.1 F (36.7 C)-99.4 F (37.4 C)] 98.7 F (37.1 C) (10/27 1200) Pulse Rate:  [55-67] 55 (10/27 1200) Resp:  [16-17] 16 (10/27 1200) BP: (110-147)/(38-60) 147/52 (10/27 1200) SpO2:  [98 %-100 %] 100 % (10/27 1200) Weight:  [139.8 kg] 139.8 kg (10/27 0633)  Weight change: 3.674 kg Filed Weights   07/25/20 1339 07/25/20 2345 07/27/20 0633  Weight: 136.1 kg (!) 137 kg (!) 139.8 kg    Intake/Output: I/O last 3 completed shifts: In: 1520 [P.O.:840; Blood:680] Out: -    Intake/Output this shift:  Total I/O In: 120 [P.O.:120] Out: 500 [Urine:500]  Physical Exam: General:  Sleeping in bed, arousable to call  Head:   Moist oral mucosal membranes  Eyes:  Anicteric  Lungs:   Respiration even, dyspneic with mild exertion, lungs diminished at the bases  Heart:  Regular rate and rhythm  Abdomen:   Obese,distended  Extremities:  Bilateral lower extremities with dressing  Neurologic:  Lethargic, speech clear and appropriate when awake  Skin:  Bilateral lower legs with dressing clean, dry, and intact       Basic Metabolic Panel: Recent Labs  Lab 07/25/20 1344 07/26/20 0040 07/26/20 1143 07/27/20 0752  NA 137  --  135 136  K 3.2*  --  3.1* 3.8  CL 101  --  100 102  CO2 24  --  24 24  GLUCOSE 157*  --  225* 212*  BUN 113*  --  105* 106*  CREATININE 3.39*  --  3.28* 3.61*  CALCIUM 8.6*  --  8.3* 8.2*  MG  --  2.1 2.2  --   PHOS  --   --  5.8*  --     Liver Function Tests: Recent Labs  Lab 07/25/20 1344 07/26/20 1143  AST 35 30  ALT 25 23  ALKPHOS 80 69  BILITOT 0.8 1.0  PROT 6.4* 5.9*  ALBUMIN 2.7* 2.5*   No  results for input(s): LIPASE, AMYLASE in the last 168 hours. No results for input(s): AMMONIA in the last 168 hours.  CBC: Recent Labs  Lab 07/25/20 0040 07/25/20 1344 07/26/20 0040 07/26/20 1143  WBC  --  5.5  --  5.4  HGB 7.0* 6.8* 6.6* 7.4*  HCT 21.4* 20.7* 20.8* 22.4*  MCV  --  92.8  --  91.1  PLT  --  111*  --  97*    Cardiac Enzymes: No results for input(s): CKTOTAL, CKMB, CKMBINDEX, TROPONINI in the last 168 hours.  BNP: Invalid input(s): POCBNP  CBG: Recent Labs  Lab 07/26/20 1803 07/27/20 0008 07/27/20 0308 07/27/20 0628 07/27/20 1157  GLUCAP 194* 201* 191* 200* 192*    Microbiology: Results for orders placed or performed during the hospital encounter of 07/25/20  Respiratory Panel by RT PCR (Flu A&B, Covid) - Nasopharyngeal Swab     Status: None   Collection Time: 07/25/20  6:22 PM   Specimen: Nasopharyngeal Swab  Result Value Ref Range Status   SARS Coronavirus 2 by RT PCR NEGATIVE NEGATIVE Final    Comment: (NOTE) SARS-CoV-2 target nucleic acids are NOT DETECTED.  The SARS-CoV-2 RNA is generally detectable in  upper respiratoy specimens during the acute phase of infection. The lowest concentration of SARS-CoV-2 viral copies this assay can detect is 131 copies/mL. A negative result does not preclude SARS-Cov-2 infection and should not be used as the sole basis for treatment or other patient management decisions. A negative result may occur with  improper specimen collection/handling, submission of specimen other than nasopharyngeal swab, presence of viral mutation(s) within the areas targeted by this assay, and inadequate number of viral copies (<131 copies/mL). A negative result must be combined with clinical observations, patient history, and epidemiological information. The expected result is Negative.  Fact Sheet for Patients:  PinkCheek.be  Fact Sheet for Healthcare Providers:   GravelBags.it  This test is no t yet approved or cleared by the [place] FDA and  has been authorized for detection and/or diagnosis of SARS-CoV-2 by FDA under an Emergency Use Authorization (EUA). This EUA will remain  in effect (meaning this test can be used) for the duration of the COVID-19 declaration under Section 564(b)(1) of the Act, 21 U.S.C. section 360bbb-3(b)(1), unless the authorization is terminated or revoked sooner.     Influenza A by PCR NEGATIVE NEGATIVE Final   Influenza B by PCR NEGATIVE NEGATIVE Final    Comment: (NOTE) The Xpert Xpress SARS-CoV-2/FLU/RSV assay is intended as an aid in  the diagnosis of influenza from Nasopharyngeal swab specimens and  should not be used as a sole basis for treatment. Nasal washings and  aspirates are unacceptable for Xpert Xpress SARS-CoV-2/FLU/RSV  testing.  Fact Sheet for Patients: PinkCheek.be  Fact Sheet for Healthcare Providers: GravelBags.it  This test is not yet approved or cleared by the [place] FDA and  has been authorized for detection and/or diagnosis of SARS-CoV-2 by  FDA under an Emergency Use Authorization (EUA). This EUA will remain  in effect (meaning this test can be used) for the duration of the  Covid-19 declaration under Section 564(b)(1) of the Act, 21  U.S.C. section 360bbb-3(b)(1), unless the authorization is  terminated or revoked. Performed at [facility], [address] [place], [place] [address]     Coagulation Studies: Recent Labs    07/26/20 0040 07/26/20 1143  LABPROT 16.0* 16.4*  INR 1.3* 1.4*    Urinalysis: No results for input(s): COLORURINE, LABSPEC, PHURINE, GLUCOSEU, HGBUR, BILIRUBINUR, KETONESUR, PROTEINUR, UROBILINOGEN, NITRITE, LEUKOCYTESUR in the last 72 hours.  Invalid input(s): APPERANCEUR    Imaging: No results found.   Medications:   . sodium chloride      . insulin aspart  0-9 Units Subcutaneous Q6H  . insulin detemir  5 Units Subcutaneous BID  . lactulose  30 g Oral BID  . pantoprazole (PROTONIX) IV  40 mg Intravenous Q12H  . rifaximin  550 mg Oral BID   LORazepam, ondansetron (ZOFRAN) IV  Assessment/ Plan:  69 y.o. male with past medical history of cirrhosis, esophageal varices, iron deficiency anemia, chronic venous stasis of both lower extremities, morbid obesity, chronic kidney disease stage IV, COPD, anemia of chronic kidney disease, secondary hyperparathyroidism who presented with GI bleed and acute kidney injury.  1.  Acute kidney injury/chronic kidney disease stage IV  Baseline creatinine is 2.4 Lab Results  Component Value Date   CREATININE 3.61 (H) 07/27/2020   CREATININE 3.28 (H) 07/26/2020   CREATININE 3.39 (H) 07/25/2020  BUN 106 Avoid nephrotoxic agents No acute indication for dialysis, but will monitor renal function closely   2.  Anemia of chronic kidney disease/GI bleed.  He is getting evaluated by  the gastroenterology team for GI bleed He received blood transfusion on admission We will continue monitoring closely  3.  Hypokalemia.  Patient received IV KCl on 07/26/2020 Potassium 3.8 today  4. Diabetes type 2 with CKD Blood glucose readings above goal He is on sliding scale insulin aspart and insulin detemir    LOS: 1 Corey West 10/27/20212:48 PM

## 2020-07-27 NOTE — Progress Notes (Signed)
PROGRESS NOTE    Corey West  UEA:[id number] DOB: January 20, 1951 DOA: 07/25/2020 PCP: Leonel Ramsay, MD    Chief Complaint  Patient presents with  . Abnormal Lab  . 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  He does have baseline shortness of breath without recent worsening.  Patient follows withDr. Lissa Hoard his outpatient nephrologist.   Patient was recently taken off his Lasix due to trending up creatinine.  Patient has a history of upper GI bleed, EGD performed byDr. Lysle Pearl 07/18/2020 notable for gastric antral vascular ectasias/plaser therapy,without overt source of active bleed  In the ED, he was mildly tachypneic.  Hemoglobin was 6.8 with MCV of 93.  Platelet was 111.  COVID-19 and flu was negative.  Chest x-ray showed stable cardiomegaly  Subjective:  Reports feeling weak and sleepy whole day He wonder is he can have home 02 arranged He is currently on clears diet, he denies pain, no sign of overt bleeding,  Assessment & Plan:   Principal Problem:   Acute on chronic anemia Active Problems:   Cirrhosis ([place])   Acute kidney injury superimposed on CKD ([place])   Type 2 diabetes mellitus with diabetic nephropathy, with long-term current use of insulin (HCC)   Chronic diastolic CHF (congestive heart failure) (HCC)   Generalized weakness   GI bleed  Acute on chronic anemia: Patient does have history of GI  bleed in the past.  Likely subacute/chronic GI bleed with underlying chronic liver disease.  History of multiple EGD and capsule endoscopy.  Last endoscopy on 07/18/2020.  Not on any blood thinners.   Currently on Protonix IV twice daily GI plan to rescope tomorrow, n.p.o. after midnight   Acute kidney injury on stage IV chronic kidney disease: baseline creatinine of 2.7-2.9, presenting creatinine noted to be 3.39 Today is 3.61 Nephrology following  Cirrhosis of liver with history of portal hypertension esophageal varices and hepatic encephalopathy.  Lasix was discontinued recently due to rising creatinine levels.  He is not on spironolactone.  Currently on lactulose and rifaximin.  INR of 1.3.-1.4 No confusion today  Chronic diastolic heart failure: Echocardiogram done on July 2021 with LVEF 50 to 55%, no evidence of focal wall motion abnormalities, grade 2 diastolic dysfunction.   Chest x-ray with stable cardiomegaly and mild pulmonary vascular congestion Generalized edema anasarca, off diuretics due to elevated renal function, continue bilateral Unna boots ( reports getting it changed twice a week at home, last changed on 10/26) Getting blood product and IV albumin Close monitor volume status  Type 2 diabetes mellitus with diabetic nephropathy. on  NovoLog 70/30 as outpatient.  On Levemir while in the hospital with sliding scale insulin and Accu-Cheks.'   Class III obesity Body mass index is 45.5 kg/m.   DVT prophylaxis: none due to blood loss anemia, getting bilateral unna boots   Code Status:full Family Communication: patient Disposition:   Status is: Inpatient  Dispo: The patient is from: home              Anticipated d/c is to: TBD  Anticipated d/c date is: TBS              Patient currently not medically ready to discharge  Consultants:   Nephrology  GI  Procedures:   None  Antimicrobials:   Rifaximin     Objective: Vitals:   07/27/20  0633 07/27/20 0841 07/27/20 1200 07/27/20 1643  BP: (!) 126/49 (!) 134/53 (!) 147/52 (!) 142/58  Pulse: (!) 59 (!) 56 (!) 55 (!) 52  Resp: 16 17 16 17   Temp: 98.1 F (36.7 C) 98.8 F (37.1 C) 98.7 F (37.1 C) 98.6 F (37 C)  TempSrc: Oral     SpO2: 100% 100% 100% 100%  Weight: (!) 139.8 kg       Intake/Output Summary (Last 24 hours) at 07/27/2020 1843 Last data filed at 07/27/2020 1428 Gross per 24 hour  Intake 120 ml  Output 500 ml  Net -380 ml   Filed Weights   07/25/20 1339 07/25/20 2345 07/27/20 8182  Weight: 136.1 kg (!) 137 kg (!) 139.8 kg    Examination:  General exam: Chronically ill appearing, generalized anasarca, obese Respiratory system: diminished at basis, no wheezing, no rales, no rhonchi, Respiratory effort normal. Cardiovascular system: S1 & S2 heard, RRR.  Gastrointestinal system: Abdomen is nondistended, soft and nontender. . Normal bowel sounds heard. Central nervous system: Alert and oriented. No focal neurological deficits. Extremities: generalized weakness Skin: bilateral lower extremity unna boots on  Psychiatry: Judgement and insight appear normal. Mood & affect appropriate.     Data Reviewed: I have personally reviewed following labs and imaging studies  CBC: Recent Labs  Lab 07/25/20 0040 07/25/20 1344 07/26/20 0040 07/26/20 1143  WBC  --  5.5  --  5.4  HGB 7.0* 6.8* 6.6* 7.4*  HCT 21.4* 20.7* 20.8* 22.4*  MCV  --  92.8  --  91.1  PLT  --  111*  --  97*    Basic Metabolic Panel: Recent Labs  Lab 07/25/20 1344 07/26/20 0040 07/26/20 1143 07/27/20 0752  NA 137  --  135 136  K 3.2*  --  3.1* 3.8  CL 101  --  100 102  CO2 24  --  24 24  GLUCOSE 157*  --  225* 212*  BUN 113*  --  105* 106*  CREATININE 3.39*  --  3.28* 3.61*  CALCIUM 8.6*  --  8.3* 8.2*  MG  --  2.1 2.2  --   PHOS  --   --  5.8*  --     GFR: Estimated Creatinine Clearance: 27.2 mL/min (A) (by C-G formula based on SCr of 3.61 mg/dL (H)).  Liver Function  Tests: Recent Labs  Lab 07/25/20 1344 07/26/20 1143  AST 35 30  ALT 25 23  ALKPHOS 80 69  BILITOT 0.8 1.0  PROT 6.4* 5.9*  ALBUMIN 2.7* 2.5*    CBG: Recent Labs  Lab 07/27/20 0008 07/27/20 0308 07/27/20 0628 07/27/20 1157 07/27/20 1801  GLUCAP 201* 191* 200* 192* 248*     Recent Results (from the past 240 hour(s))  Respiratory Panel by RT PCR (Flu A&B, Covid) - Nasopharyngeal Swab     Status: None   Collection Time: 07/25/20  6:22 PM   Specimen: Nasopharyngeal Swab  Result Value Ref Range Status   SARS Coronavirus 2 by RT PCR NEGATIVE NEGATIVE Final    Comment: (NOTE) SARS-CoV-2 target nucleic acids are NOT DETECTED.  The SARS-CoV-2 RNA is generally detectable in upper respiratoy specimens during the acute phase  of infection. The lowest concentration of SARS-CoV-2 viral copies this assay can detect is 131 copies/mL. A negative result does not preclude SARS-Cov-2 infection and should not be used as the sole basis for treatment or other patient management decisions. A negative result may occur with  improper specimen collection/handling, submission of specimen other than nasopharyngeal swab, presence of viral mutation(s) within the areas targeted by this assay, and inadequate number of viral copies (<131 copies/mL). A negative result must be combined with clinical observations, patient history, and epidemiological information. The expected result is Negative.  Fact Sheet for Patients:  PinkCheek.be  Fact Sheet for Healthcare Providers:  GravelBags.it  This test is no t yet approved or cleared by the [place] FDA and  has been authorized for detection and/or diagnosis of SARS-CoV-2 by FDA under an Emergency Use Authorization (EUA). This EUA will remain  in effect (meaning this test can be used) for the duration of the COVID-19 declaration under Section 564(b)(1) of the Act, 21 U.S.C. section  360bbb-3(b)(1), unless the authorization is terminated or revoked sooner.     Influenza A by PCR NEGATIVE NEGATIVE Final   Influenza B by PCR NEGATIVE NEGATIVE Final    Comment: (NOTE) The Xpert Xpress SARS-CoV-2/FLU/RSV assay is intended as an aid in  the diagnosis of influenza from Nasopharyngeal swab specimens and  should not be used as a sole basis for treatment. Nasal washings and  aspirates are unacceptable for Xpert Xpress SARS-CoV-2/FLU/RSV  testing.  Fact Sheet for Patients: PinkCheek.be  Fact Sheet for Healthcare Providers: GravelBags.it  This test is not yet approved or cleared by the [place] FDA and  has been authorized for detection and/or diagnosis of SARS-CoV-2 by  FDA under an Emergency Use Authorization (EUA). This EUA will remain  in effect (meaning this test can be used) for the duration of the  Covid-19 declaration under Section 564(b)(1) of the Act, 21  U.S.C. section 360bbb-3(b)(1), unless the authorization is  terminated or revoked. Performed at [facility], [address], [place],  [address]          Radiology Studies: No results found.      Scheduled Meds: . insulin aspart  0-9 Units Subcutaneous Q6H  . insulin detemir  5 Units Subcutaneous BID  . lactulose  30 g Oral BID  . pantoprazole (PROTONIX) IV  40 mg Intravenous Q12H  . rifaximin  550 mg Oral BID   Continuous Infusions: . sodium chloride    . albumin human       LOS: 1 day   Time spent: 33mins Greater than 50% of this time was spent in counseling, explanation of diagnosis, planning of further management, and coordination of care.  I have personally reviewed and interpreted on  07/27/2020 daily labs, I reviewed all nursing notes, pharmacy notes, consultant notes,  vitals, pertinent old records  I have discussed plan of care as described above with RN , patient on 07/27/2020  Voice  Recognition /Dragon dictation system was used to create this note, attempts have been made to correct errors. Please contact the author with questions and/or clarifications.   Florencia Reasons, MD PhD FACP [facility]  Available via Epic secure chat 7am-7pm for nonurgent issues Please page for urgent issues To page the attending provider between 7A-7P or the covering provider during after hours 7P-7A, please log into the web site [email or web address] and access using universal [place] password for that web site. If you do not have the password, please call  the hospital operator.    07/27/2020, 6:43 PM

## 2020-07-27 NOTE — TOC Initial Note (Addendum)
Transition of Care [facility]) - Initial/Assessment Note    Patient Details  Name: Corey West MRN: [id number] Date of Birth: 21-Jun-1951  Transition of Care [facility]) CM/SW Contact:    Magnus Ivan, LCSW Phone Number: 07/27/2020, 2:49 PM  Clinical Narrative:                CSW spoke with patient for Readmission Screening. Patient lives at home alone. Nephew transports to appointments but at times is unreliable. Patient has  walker and cane at home. He asked if he needs home oxygen, asked RN Emily/Dr. Erlinda Hong to do work up to see if patient qualifies. [facility]acy [facility]. PCP is Dr. Ola Spurr. Patient reported he has Home Health through [facility] but would really want to switch to [facility] if possible because he has used them in the past and was very happy with them. Patient said he wants "all the help he can get" at home. CSW asked [name] with [facility] if they could accept patient for PT, OT, RN, [place], Social Work. Waiting to hear back.    Expected Discharge Plan: [place] Barriers to Discharge: Continued Medical Work up   Patient Goals and CMS Choice Patient states their goals for this hospitalization and ongoing recovery are:: home with home health services CMS Medicare.gov Compare Post Acute Care list provided to:: Patient Choice offered to / list presented to : Patient  Expected Discharge Plan and Services Expected Discharge Plan: [place]       Living arrangements for the past 2 months: Single Family Home                                      Prior Living Arrangements/Services Living arrangements for the past 2 months: Single Family Home Lives with:: Self Patient language and need for interpreter reviewed:: Yes Do you feel safe going back to the place where you live?: Yes      Need for Family Participation in Patient Care: Yes (Comment) Care giver support system in place?: Yes (comment) Current home services: DME, Home RN, Home  PT Criminal Activity/Legal Involvement Pertinent to Current Situation/Hospitalization: No - Comment as needed  Activities of Daily Living Home Assistive Devices/Equipment: Gilford Rile (specify type) ADL Screening (condition at time of admission) Patient's cognitive ability adequate to safely complete daily activities?: Yes Is the patient deaf or have difficulty hearing?: Yes Does the patient have difficulty seeing, even when wearing glasses/contacts?: No Does the patient have difficulty concentrating, remembering, or making decisions?: No Patient able to express need for assistance with ADLs?: Yes Does the patient have difficulty dressing or bathing?: Yes Independently performs ADLs?: Yes (appropriate for developmental age) Does the patient have difficulty walking or climbing stairs?: Yes Weakness of Legs: Both Weakness of Arms/Hands: None  Permission Sought/Granted Permission sought to share information with : Chartered certified accountant granted to share information with : Yes, Verbal Permission Granted     Permission granted to share info w AGENCY: Home Health        Emotional Assessment   Attitude/Demeanor/Rapport: Engaged Affect (typically observed): Appropriate, Accepting, Sad Orientation: : Oriented to Situation, Oriented to  Time, Oriented to Place, Oriented to Self Alcohol / Substance Use: Not Applicable Psych Involvement: No (comment)  Admission diagnosis:  Symptomatic anemia [D64.9] Cirrhosis of liver with ascites, unspecified hepatic cirrhosis type ([place]) [K74.60, R18.8] Acute renal failure superimposed on stage 4  chronic kidney disease, unspecified acute renal failure type ([place]) [N17.9, N18.4] Acute on chronic anemia [D64.9] GI bleed [K92.2] Patient Active Problem List   Diagnosis Date Noted  . GI bleed 07/26/2020  . Acute on chronic anemia 07/25/2020  . Generalized weakness 07/25/2020  . SIRS (systemic inflammatory response syndrome) ([place]) 07/07/2020   . Anemia   . Hypothermia   . Anemia of chronic disease 06/07/2020  . Prolonged QT interval 06/07/2020  . Hypoglycemia 06/07/2020  . Ascites 06/07/2020  . Portal hypertensive gastropathy ([place])   . Symptomatic anemia 05/19/2020  . Acute blood loss anemia 05/19/2020  . Acute GI bleeding 05/19/2020  . Bacteremia due to Klebsiella pneumoniae 04/26/2020  . Palliative care by specialist   . DNR (do not resuscitate) discussion   . COPD with chronic bronchitis ([place]) 04/23/2020  . Paroxysmal atrial fibrillation ([place]) 04/20/2020  . Cellulitis of left leg 02/23/2020  . Contusion of ribs, left, initial encounter 02/23/2020  . Acute hypoxemic respiratory failure ([place]) 02/04/2020  . Volume overload 01/28/2020  . Hyperlipidemia associated with type 2 diabetes mellitus ([place]) 01/28/2020  . Liver cirrhosis ([place])   . Anxiety   . SOB (shortness of breath)   . Elevated troponin   . CKD (chronic kidney disease) stage 4, GFR 15-29 ml/min (HCC)   . Unstageable pressure ulcer of sacral region ([place]) 11/28/2019  . Chronic diastolic CHF (congestive heart failure) ([place]) 11/27/2019  . Type II diabetes mellitus with renal manifestations ([place]) 11/27/2019  . Cellulitis of left lower extremity 11/27/2019  . Cellulitis of lower extremity 11/27/2019  . Cyst of pancreas 05/18/2019  . HCAP (healthcare-associated pneumonia) 02/03/2019  . Aphasia 01/14/2019  . Acute on chronic renal failure ([place]) 01/14/2019  . Malnutrition of moderate degree 12/03/2018  . C. difficile diarrhea 12/02/2018  . Ankle fracture, left, closed, initial encounter 10/15/2018  . Ankle fracture, right, closed, initial encounter 10/15/2018  . Ankle fracture 10/15/2018  . Pressure injury of skin 07/19/2018  . UTI (urinary tract infection) 07/18/2018  . Acute kidney injury superimposed on CKD ([place]) 01/02/2018  . 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 Goals of care, counseling/discussion   . Palliative care encounter   . Acute  hepatic encephalopathy 12/16/2017  . Hepatic encephalopathy ([place]) 10/18/2017  . Hypokalemia 10/09/2017  . Hyperglycemia 10/09/2017  . Sepsis ([place]) 06/12/2017  . Type 2 diabetes mellitus with diabetic nephropathy, with long-term current use of insulin ([place]) 05/29/2017  . Long term (current) use of insulin ([place]) 05/29/2017  . Cellulitis in diabetic foot ([place]) 02/21/2017  . Cellulitis 02/21/2017  . Upper GI bleed/melena 07/20/2015  . Hematemesis 02/08/2015  . Cirrhosis ([place]) 02/08/2015  . Esophageal varices ([place]) 02/08/2015  . Gastroesophageal reflux disease 02/08/2015  . Hyperlipidemia 02/08/2015  . Chronic venous stasis dermatitis of both lower extremities 10/06/2014  . Cutaneous abscess of limb, unspecified 09/17/2014  . Edema 05/25/2014  . Hypertension 05/25/2014  . Obesity, Class III, BMI 40-49.9 (morbid obesity) ([place]) 05/25/2014  . Sleep apnea 05/25/2014  . Barrett's esophagus 05/25/2014  . Pancytopenia ([place]) 05/25/2014  . History of esophageal varices 05/25/2014  . Iron deficiency anemia 12/04/2013  . Thrombocytopenia ([place]) 12/04/2013  . Splenomegaly 11/04/2013   PCP:  Leonel Ramsay, MD Pharmacy:   [facility] [facility] [place] [facility] [address] [place] [address] Phone: [phone] Fax: [phone]     Social Determinants of Health (SDOH) Interventions    Readmission Risk Interventions Readmission Risk Prevention Plan 07/27/2020 06/09/2020  02/02/2020  Transportation Screening Complete Complete Complete  PCP or Specialist Appt within 3-5 Days - - Complete  HRI or Home Care Consult - - Complete  Social Work Consult for [place] Planning/Counseling - - Complete  Palliative Care Screening - - Not Applicable  Medication Review Press photographer) Complete Complete Complete  PCP or Specialist appointment within 3-5 days of discharge Complete Complete -  [place] or Home Care Consult Complete Complete -  SW Recovery Care/Counseling Consult  Complete Complete -  SW Consult Not Complete Comments - - -  Palliative Care Screening Not Applicable Not Applicable -  Skilled Nursing Facility Complete Not Applicable -  Some recent data might be hidden

## 2020-07-27 NOTE — Progress Notes (Signed)
Inpatient Diabetes Program Recommendations  AACE/ADA: New Consensus Statement on Inpatient Glycemic Control (2015)  Target Ranges:  Prepandial:   less than 140 mg/dL      Peak postprandial:   less than 180 mg/dL (1-2 hours)      Critically ill patients:  140 - 180 mg/dL   Lab Results  Component Value Date   GLUCAP 192 (H) 07/27/2020   HGBA1C 5.7 (H) 05/19/2020    Review of Glycemic Control Results for ROBERTT, BUDA (MRN [id number]) as of 07/27/2020 12:50  Ref. Range 07/26/2020 18:03 07/27/2020 00:08 07/27/2020 03:08 07/27/2020 06:28 07/27/2020 11:57  Glucose-Capillary Latest Ref Range: 70 - 99 mg/dL 194 (H) 201 (H) 191 (H) 200 (H) 192 (H)   Diabetes history: DM 2 Outpatient Diabetes medications:  Novolog mix 70/30 0-84 units bid Current orders for Inpatient glycemic control:  Novolog sensitive q 6 hours Levemir 5 units bid Inpatient Diabetes Program Recommendations:    Consider increasing Levemir to 8 units bid.    Thanks  Adah Perl, RN, BC-ADM Inpatient Diabetes Coordinator Pager [phone] (8a-5p)

## 2020-07-27 NOTE — Progress Notes (Signed)
Patient is resting in bed. Called out due to incontinent episode of stool. Patient has +4 edema to bilateral lower extremities. With two person assist, we were able to turn from side to side. He does have some breakdown in between the buttocks. Removed foam dressing due to stool and urine saturation. Did not replace due to moisture accumulating into area. His lower extremities are wrapped, clean dry intact. Bed in low position and call bell in reach.

## 2020-07-27 NOTE — Progress Notes (Signed)
GI Inpatient Follow-up Note  Subjective:  Patient seen and no acute changes. No bowel movements.  Scheduled Inpatient Medications:  . insulin aspart  0-9 Units Subcutaneous Q6H  . insulin detemir  5 Units Subcutaneous BID  . lactulose  30 g Oral BID  . pantoprazole (PROTONIX) IV  40 mg Intravenous Q12H  . rifaximin  550 mg Oral BID    Continuous Inpatient Infusions:   . sodium chloride      PRN Inpatient Medications:  LORazepam, ondansetron (ZOFRAN) IV  Review of Systems: Review of Systems  Constitutional: Negative for chills and fever.  Respiratory: Positive for shortness of breath.   Cardiovascular: Negative for chest pain.  Gastrointestinal: Positive for nausea. Negative for abdominal pain, blood in stool, diarrhea and vomiting.  Musculoskeletal: Positive for joint pain.  Psychiatric/Behavioral: Negative for substance abuse.  All other systems reviewed and are negative.    Physical Examination: BP (!) 147/52 (BP Location: Right Arm)   Pulse (!) 55   Temp 98.7 F (37.1 C)   Resp 16   Wt (!) 139.8 kg   SpO2 100%   BMI 45.50 kg/m  Gen: NAD, alert and oriented x 4 HEENT: PEERLA, EOMI, Neck: supple, no JVD Chest: No respiratory distress CV: RRR Abd: soft, non-tender, non-distended Ext: 2+ edema Skin: no rash or lesions noted Lymph: no lymphadenopathy  Data: Lab Results  Component Value Date   WBC 5.4 07/26/2020   HGB 7.4 (L) 07/26/2020   HCT 22.4 (L) 07/26/2020   MCV 91.1 07/26/2020   PLT 97 (L) 07/26/2020   Recent Labs  Lab 07/25/20 1344 07/26/20 0040 07/26/20 1143  HGB 6.8* 6.6* 7.4*   Lab Results  Component Value Date   NA 136 07/27/2020   K 3.8 07/27/2020   CL 102 07/27/2020   CO2 24 07/27/2020   BUN 106 (H) 07/27/2020   CREATININE 3.61 (H) 07/27/2020   Lab Results  Component Value Date   ALT 23 07/26/2020   AST 30 07/26/2020   ALKPHOS 69 07/26/2020   BILITOT 1.0 07/26/2020   Recent Labs  Lab 07/26/20 1143  INR 1.4*    Assessment/Plan:  69 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 Given kidney function and normal iron studies with no overt GI bleeding currently, will plan on just EGD tomorrow. His anemia is likely multifactorial from his comorbidities  Recommendations:  - please check CBC and transfuse if hemoglobin is < 7 - check daily CMP and INR - NPO at midnight for procedure tomorrow, can have diet for now - consider using 25 grams of 25% albumin q6 hours  Please call with any questions or concerns.   Raylene Miyamoto MD, MPH [place]

## 2020-07-28 ENCOUNTER — Inpatient Hospital Stay: Payer: Medicare Other | Admitting: Anesthesiology

## 2020-07-28 ENCOUNTER — Inpatient Hospital Stay: Payer: Medicare Other | Attending: Oncology

## 2020-07-28 ENCOUNTER — Encounter
Admission: EM | Disposition: A | Discharge status: Home Health | Payer: Self-pay | Source: Home / Self Care | Attending: Internal Medicine

## 2020-07-28 ENCOUNTER — Encounter: Payer: Self-pay | Admitting: Internal Medicine

## 2020-07-28 ENCOUNTER — Inpatient Hospital Stay: Payer: Medicare Other

## 2020-07-28 DIAGNOSIS — D649 Anemia, unspecified: Secondary | ICD-10-CM | POA: Diagnosis not present

## 2020-07-28 HISTORY — PX: ESOPHAGOGASTRODUODENOSCOPY (EGD) WITH PROPOFOL: SHX5813

## 2020-07-28 LAB — BASIC METABOLIC PANEL
Anion gap: 11 (ref 5–15)
BUN: 100 mg/dL — ABNORMAL HIGH (ref 8–23)
CO2: 24 mmol/L (ref 22–32)
Calcium: 8.5 mg/dL — ABNORMAL LOW (ref 8.9–10.3)
Chloride: 104 mmol/L (ref 98–111)
Creatinine, Ser: 3.33 mg/dL — ABNORMAL HIGH (ref 0.61–1.24)
GFR, Estimated: 19 mL/min — ABNORMAL LOW (ref >60)
Glucose, Bld: 174 mg/dL — ABNORMAL HIGH (ref 70–99)
Potassium: 4 mmol/L (ref 3.5–5.1)
Sodium: 139 mmol/L (ref 135–145)

## 2020-07-28 LAB — GLUCOSE, CAPILLARY
Glucose-Capillary: 123 mg/dL — ABNORMAL HIGH (ref 70–99)
Glucose-Capillary: 131 mg/dL — ABNORMAL HIGH (ref 70–99)
Glucose-Capillary: 136 mg/dL — ABNORMAL HIGH (ref 70–99)
Glucose-Capillary: 138 mg/dL — ABNORMAL HIGH (ref 70–99)
Glucose-Capillary: 214 mg/dL — ABNORMAL HIGH (ref 70–99)

## 2020-07-28 LAB — URINALYSIS, COMPLETE (UACMP) WITH MICROSCOPIC
Bilirubin Urine: NEGATIVE
Glucose, UA: NEGATIVE mg/dL
Hgb urine dipstick: NEGATIVE
Ketones, ur: NEGATIVE mg/dL
Leukocytes,Ua: NEGATIVE
Nitrite: NEGATIVE
Protein, ur: 100 mg/dL — AB
Specific Gravity, Urine: 1.016 (ref 1.005–1.030)
pH: 5 (ref 5.0–8.0)

## 2020-07-28 LAB — CBC WITH DIFFERENTIAL/PLATELET
Abs Immature Granulocytes: 0.03 10*3/uL (ref 0.00–0.07)
Basophils Absolute: 0 10*3/uL (ref 0.0–0.1)
Basophils Relative: 0 %
Eosinophils Absolute: 0 10*3/uL (ref 0.0–0.5)
Eosinophils Relative: 0 %
HCT: 22.7 % — ABNORMAL LOW (ref 39.0–52.0)
Hemoglobin: 7.3 g/dL — ABNORMAL LOW (ref 13.0–17.0)
Immature Granulocytes: 1 %
Lymphocytes Relative: 10 %
Lymphs Abs: 0.4 10*3/uL — ABNORMAL LOW (ref 0.7–4.0)
MCH: 30.2 pg (ref 26.0–34.0)
MCHC: 32.2 g/dL (ref 30.0–36.0)
MCV: 93.8 fL (ref 80.0–100.0)
Monocytes Absolute: 0.5 10*3/uL (ref 0.1–1.0)
Monocytes Relative: 12 %
Neutro Abs: 3.1 10*3/uL (ref 1.7–7.7)
Neutrophils Relative %: 77 %
Platelets: 87 10*3/uL — ABNORMAL LOW (ref 150–400)
RBC: 2.42 MIL/uL — ABNORMAL LOW (ref 4.22–5.81)
RDW: 16.8 % — ABNORMAL HIGH (ref 11.5–15.5)
WBC: 4 10*3/uL (ref 4.0–10.5)
nRBC: 0 % (ref 0.0–0.2)

## 2020-07-28 SURGERY — ESOPHAGOGASTRODUODENOSCOPY (EGD) WITH PROPOFOL
Anesthesia: General

## 2020-07-28 MED ORDER — LIDOCAINE HCL (CARDIAC) PF 100 MG/5ML IV SOSY
PREFILLED_SYRINGE | INTRAVENOUS | Status: DC | PRN
  Administered 2020-07-28: 100 mg via INTRAVENOUS

## 2020-07-28 MED ORDER — PHENYLEPHRINE HCL (PRESSORS) 10 MG/ML IV SOLN
INTRAVENOUS | Status: DC | PRN
  Administered 2020-07-28: 100 ug via INTRAVENOUS

## 2020-07-28 MED ORDER — GLYCOPYRROLATE 0.2 MG/ML IJ SOLN
INTRAMUSCULAR | Status: DC | PRN
  Administered 2020-07-28: .2 mg via INTRAVENOUS

## 2020-07-28 MED ORDER — PROPOFOL 10 MG/ML IV BOLUS
INTRAVENOUS | Status: DC | PRN
  Administered 2020-07-28: 20 mg via INTRAVENOUS
  Administered 2020-07-28: 10 mg via INTRAVENOUS
  Administered 2020-07-28: 40 mg via INTRAVENOUS

## 2020-07-28 MED ORDER — PROPOFOL 500 MG/50ML IV EMUL
INTRAVENOUS | Status: AC
  Filled 2020-07-28: qty 50

## 2020-07-28 MED ORDER — SODIUM CHLORIDE 0.9 % IV SOLN: INTRAVENOUS | Status: DC

## 2020-07-28 MED ORDER — LIDOCAINE HCL (PF) 1 % IJ SOLN
INTRAMUSCULAR | Status: AC
  Filled 2020-07-28: qty 2

## 2020-07-28 MED ORDER — PROPOFOL 500 MG/50ML IV EMUL
INTRAVENOUS | Status: DC | PRN
  Administered 2020-07-28: 75 ug/kg/min via INTRAVENOUS

## 2020-07-28 MED ORDER — LIDOCAINE HCL (PF) 2 % IJ SOLN
INTRAMUSCULAR | Status: AC
  Filled 2020-07-28: qty 15

## 2020-07-28 NOTE — Progress Notes (Signed)
[facility]  ROUNDING NOTE   Subjective:  Patient sleeping in bed, arousable to call, in no acute distress.  According [place] report patient is n.p.o. for EGD today.  Objective:  Vital signs in last 24 hours:  Temp:  [97.5 F (36.4 C)-98.6 F (37 C)] 98.1 F (36.7 C) (10/28 0839) Pulse Rate:  [52-54] 54 (10/28 0839) Resp:  [17-20] 20 (10/28 0839) BP: (136-153)/(43-105) 153/67 (10/28 0839) SpO2:  [100 %] 100 % (10/28 0839) Weight:  [141.4 kg] 141.4 kg (10/28 0500)  Weight change: 1.647 kg Filed Weights   07/25/20 2345 07/27/20 0633 07/28/20 0500  Weight: (!) 137 kg (!) 139.8 kg (!) 141.4 kg    Intake/Output: I/O last 3 completed shifts: In: 213.1 [P.O.:120; IV Piggyback:93.1] Out: 500 [Urine:500]   Intake/Output this shift:  No intake/output data recorded.  Physical Exam: General:  Resting in bed with eyes closed, woke up to touch  Head:  Normocephalic, atraumatic  Eyes:  Sclerae and conjunctivae clear  Lungs:   Lungs diminished at the bases, respirations symmetric and unlabored, on 2 L of supplemental O2 via nasal cannula  Heart:  S1S2, no rubs or gallops  Abdomen:   Nontender, obese, distended  Extremities:  2+ peripheral edema, bilateral lower extremities with dressing clean, dry, and intact  Neurologic:  Sleeping, arousable  Skin:  No new lesions or rashes noted       Basic Metabolic Panel: Recent Labs  Lab 07/25/20 1344 07/25/20 1344 07/26/20 0040 07/26/20 1143 07/27/20 0752 07/28/20 0435  NA 137  --   --  135 136 139  K 3.2*  --   --  3.1* 3.8 4.0  CL 101  --   --  100 102 104  CO2 24  --   --  24 24 24   GLUCOSE 157*  --   --  225* 212* 174*  BUN 113*  --   --  105* 106* 100*  CREATININE 3.39*  --   --  3.28* 3.61* 3.33*  CALCIUM 8.6*   < >  --  8.3* 8.2* 8.5*  MG  --   --  2.1 2.2  --   --   PHOS  --   --   --  5.8*  --   --    < > = values in this interval not displayed.    Liver Function Tests: Recent Labs  Lab  07/25/20 1344 07/26/20 1143  AST 35 30  ALT 25 23  ALKPHOS 80 69  BILITOT 0.8 1.0  PROT 6.4* 5.9*  ALBUMIN 2.7* 2.5*   No results for input(s): LIPASE, AMYLASE in the last 168 hours. No results for input(s): AMMONIA in the last 168 hours.  CBC: Recent Labs  Lab 07/25/20 0040 07/25/20 1344 07/26/20 0040 07/26/20 1143 07/28/20 0435  WBC  --  5.5  --  5.4 4.0  NEUTROABS  --   --   --   --  3.1  HGB 7.0* 6.8* 6.6* 7.4* 7.3*  HCT 21.4* 20.7* 20.8* 22.4* 22.7*  MCV  --  92.8  --  91.1 93.8  PLT  --  111*  --  97* 87*    Cardiac Enzymes: No results for input(s): CKTOTAL, CKMB, CKMBINDEX, TROPONINI in the last 168 hours.  BNP: Invalid input(s): POCBNP  CBG: Recent Labs  Lab 07/27/20 1157 07/27/20 1801 07/28/20 0012 07/28/20 0601 07/28/20 1204  GLUCAP 192* 248* 214* 136* 131*    Microbiology: Results for orders placed or performed during  the hospital encounter of 07/25/20  Respiratory Panel by RT PCR (Flu A&B, Covid) - Nasopharyngeal Swab     Status: None   Collection Time: 07/25/20  6:22 PM   Specimen: Nasopharyngeal Swab  Result Value Ref Range Status   SARS Coronavirus 2 by RT PCR NEGATIVE NEGATIVE Final    Comment: (NOTE) SARS-CoV-2 target nucleic acids are NOT DETECTED.  The SARS-CoV-2 RNA is generally detectable in upper respiratoy specimens during the acute phase of infection. The lowest concentration of SARS-CoV-2 viral copies this assay can detect is 131 copies/mL. A negative result does not preclude SARS-Cov-2 infection and should not be used as the sole basis for treatment or other patient management decisions. A negative result may occur with  improper specimen collection/handling, submission of specimen other than nasopharyngeal swab, presence of viral mutation(s) within the areas targeted by this assay, and inadequate number of viral copies (<131 copies/mL). A negative result must be combined with clinical observations, patient history, and  epidemiological information. The expected result is Negative.  Fact Sheet for Patients:  PinkCheek.be  Fact Sheet for Healthcare Providers:  GravelBags.it  This test is no t yet approved or cleared by the [place] FDA and  has been authorized for detection and/or diagnosis of SARS-CoV-2 by FDA under an Emergency Use Authorization (EUA). This EUA will remain  in effect (meaning this test can be used) for the duration of the COVID-19 declaration under Section 564(b)(1) of the Act, 21 U.S.C. section 360bbb-3(b)(1), unless the authorization is terminated or revoked sooner.     Influenza A by PCR NEGATIVE NEGATIVE Final   Influenza B by PCR NEGATIVE NEGATIVE Final    Comment: (NOTE) The Xpert Xpress SARS-CoV-2/FLU/RSV assay is intended as an aid in  the diagnosis of influenza from Nasopharyngeal swab specimens and  should not be used as a sole basis for treatment. Nasal washings and  aspirates are unacceptable for Xpert Xpress SARS-CoV-2/FLU/RSV  testing.  Fact Sheet for Patients: PinkCheek.be  Fact Sheet for Healthcare Providers: GravelBags.it  This test is not yet approved or cleared by the [place] FDA and  has been authorized for detection and/or diagnosis of SARS-CoV-2 by  FDA under an Emergency Use Authorization (EUA). This EUA will remain  in effect (meaning this test can be used) for the duration of the  Covid-19 declaration under Section 564(b)(1) of the Act, 21  U.S.C. section 360bbb-3(b)(1), unless the authorization is  terminated or revoked. Performed at [facility], [address] [place], [place] [address]     Coagulation Studies: Recent Labs    07/26/20 0040 07/26/20 1143  LABPROT 16.0* 16.4*  INR 1.3* 1.4*    Urinalysis: No results for input(s): COLORURINE, LABSPEC, PHURINE, GLUCOSEU, HGBUR, BILIRUBINUR, KETONESUR,  PROTEINUR, UROBILINOGEN, NITRITE, LEUKOCYTESUR in the last 72 hours.  Invalid input(s): APPERANCEUR    Imaging: No results found.   Medications:   . sodium chloride    . albumin human Stopped (07/27/20 2233)   . insulin aspart  0-9 Units Subcutaneous Q6H  . insulin detemir  5 Units Subcutaneous BID  . lactulose  30 g Oral BID  . pantoprazole (PROTONIX) IV  40 mg Intravenous Q12H  . rifaximin  550 mg Oral BID   LORazepam, ondansetron (ZOFRAN) IV  Assessment/ Plan:  69 y.o. male with past medical history of cirrhosis, esophageal varices, iron deficiency anemia, chronic venous stasis of both lower extremities, morbid obesity, chronic kidney disease stage IV, COPD, anemia of chronic kidney disease, secondary hyperparathyroidism who  presented with GI bleed and acute kidney injury.  1.  Acute kidney injury/chronic kidney disease stage IV  Baseline creatinine is 2.4 Lab Results  Component Value Date   CREATININE 3.33 (H) 07/28/2020   CREATININE 3.61 (H) 07/27/2020   CREATININE 3.28 (H) 07/26/2020   Patient started on albumin yesterday  2.  Anemia of chronic kidney disease/GI bleed.  Hemoglobin level stays low, 7.3 today Planning EGD today May consider blood transfusion again, if hemoglobin drops below 7  3.  Hypokalemia.  Potassium stable at 4.0  4. Diabetes type 2 with CKD  Patient is on Novolog and Levemir  Diabetes coordinator involved in the care,recommendation to increase Levemir to 8 units BID     LOS: 2 Corey West 10/28/202112:06 PM

## 2020-07-28 NOTE — Progress Notes (Signed)
Went into patient room to bladder scan, obtain sugar, and obtain urine specimen. During glucose check patient expressed some irritation, stated why I couldn't just leave him alone. 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 He allowed me to obtain glucose. Notified him that we were going to need a urine sample and bladder scan to check for any urine in bladder. Bladder scan revealed 394cc of urine. He huffed and stated to leave him alone. I finished what I was doing and told him I wouldn't be back in room unless it was detrimental or he needed something. He stated that I had been telling him for a while now that I was going to leave him alone and to not come back in room. Lowered bed, gave call bell, put items within reach.

## 2020-07-28 NOTE — Anesthesia Preprocedure Evaluation (Signed)
Anesthesia Evaluation  Patient identified by MRN, date of birth, ID bandGeneral Assessment Comment:Patient awake, drowsy, closing his eyes and drifting off often. Answers appropriately however  Reviewed: Allergy & Precautions, H&P , NPO status , Patient's Chart, lab work & pertinent test results, reviewed documented beta blocker date and time   History of Anesthesia Complications Negative for: history of anesthetic complications  Airway Mallampati: II   Neck ROM: full    Dental  (+) Teeth Intact   Pulmonary sleep apnea , COPD, Patient abstained from smoking., former smoker,    Pulmonary exam normal  + decreased breath sounds      Cardiovascular Exercise Tolerance: Poor hypertension, On Medications +CHF  Normal cardiovascular exam+ dysrhythmias + Valvular Problems/Murmurs AS  Rhythm:regular Rate:Normal  TTE 03/2020: Marland Kitchen Left ventricular ejection fraction, by estimation, is 50 to 55%. The  left ventricle has low normal function. The left ventricle has no regional  wall motion abnormalities. The left ventricular internal cavity size was  mildly dilated. There is mild  left ventricular hypertrophy. Left ventricular diastolic parameters are  consistent with Grade II diastolic dysfunction (pseudonormalization).  2. Right ventricular systolic function is normal. The right ventricular  size is normal. There is mildly elevated pulmonary artery systolic  pressure.  3. Left atrial size was moderately dilated.  4. Right atrial size was mildly dilated.  5. The mitral valve is normal in structure. Trivial mitral valve  regurgitation. No evidence of mitral stenosis.  6. The aortic valve is abnormal. Aortic valve regurgitation is not  visualized. Mild to moderate aortic valve stenosis. Aortic valve area, by  VTI measures 1.36 cm. Aortic valve mean gradient measures 14.3 mmHg.  7. The inferior vena cava is dilated in size with <50%  respiratory  variability, suggesting right atrial pressure of 15 mmHg.    Neuro/Psych PSYCHIATRIC DISORDERS Anxiety Depression negative neurological ROS     GI/Hepatic hiatal hernia, GERD  Medicated,(+) Cirrhosis   Esophageal Varices    ,   Endo/Other  diabetes, Well Controlled, Type 1, Insulin DependentMorbid obesity  Renal/GU CRFRenal disease  negative genitourinary   Musculoskeletal   Abdominal (+) + obese,   Peds  Hematology  (+) Blood dyscrasia, anemia ,   Anesthesia Other Findings Past Medical History: No date: A-fib ([place]) No date: Anemia No date: Anxiety     Comment:  controlled;  No date: Arthritis No date: AVM (arteriovenous malformation) of stomach, acquired with  hemorrhage No date: Barrett's esophagus No date: CHF (congestive heart failure) (HCC) No date: Chronic kidney disease     Comment:  renal infufficiency No date: Cirrhosis ([place]) No date: COPD (chronic obstructive pulmonary disease) ([place]) No date: Depression     Comment:  controlled;  No date: Diabetes mellitus without complication (HCC)     Comment:  not controlled, taking insulin but sugar continues to               run high;  No date: Dysrhythmia No date: Edema No date: Esophageal varices (HCC) No date: GAVE (gastric antral vascular ectasia) No date: GERD (gastroesophageal reflux disease) No date: Hepatic encephalopathy (HCC) No date: History of hiatal hernia No date: Hyperlipidemia No date: Hypertension     Comment:  controlled well;  No date: IPMN (intraductal papillary mucinous neoplasm) No date: Liver cirrhosis (HCC) No date: Nephropathy, 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[place]) No date: Obesity No date: Pancytopenia ([place]) No date: Polyp, stomach     Comment:  with chronic blood loss No date: Portal hypertensive gastropathy ([place]) No date: Sleep  apnea     Comment:  does not wear a cpap, Medicare would not pay for it  No date: Venous stasis dermatitis of both lower extremities No date: Venous stasis of  both lower extremities     Comment:  with cellulitis Past Surgical History: 02/09/2015: ESOPHAGOGASTRODUODENOSCOPY; N/A     Comment:  Procedure: ESOPHAGOGASTRODUODENOSCOPY (EGD);  Surgeon:               Manya Silvas, MD;  Location: [facility] ENDOSCOPY;                Service: Endoscopy;  Laterality: N/A; 07/22/2015: ESOPHAGOGASTRODUODENOSCOPY; N/A     Comment:  Procedure: ESOPHAGOGASTRODUODENOSCOPY (EGD);  Surgeon:               Manya Silvas, MD;  Location: [facility] ENDOSCOPY;                Service: Endoscopy;  Laterality: N/A; 06/26/2017: ESOPHAGOGASTRODUODENOSCOPY; N/A     Comment:  Procedure: ESOPHAGOGASTRODUODENOSCOPY (EGD);  Surgeon:               Manya Silvas, MD;  Location: [facility] ENDOSCOPY;                Service: Endoscopy;  Laterality: N/A; 06/27/2017: ESOPHAGOGASTRODUODENOSCOPY; N/A     Comment:  Procedure: ESOPHAGOGASTRODUODENOSCOPY (EGD);  Surgeon:               Manya Silvas, MD;  Location: [facility] ENDOSCOPY;                Service: Endoscopy;  Laterality: N/A; 06/28/2017: ESOPHAGOGASTRODUODENOSCOPY; N/A     Comment:  Procedure: ESOPHAGOGASTRODUODENOSCOPY (EGD);  Surgeon:               Manya Silvas, MD;  Location: [facility] ENDOSCOPY;                Service: Endoscopy;  Laterality: N/A; 10/11/2017: ESOPHAGOGASTRODUODENOSCOPY; Bilateral     Comment:  Procedure: ESOPHAGOGASTRODUODENOSCOPY (EGD);  Surgeon:               Manya Silvas, MD;  Location: [facility] ENDOSCOPY;                Service: Endoscopy;  Laterality: Bilateral; 12/04/2017: ESOPHAGOGASTRODUODENOSCOPY; N/A     Comment:  Procedure: ESOPHAGOGASTRODUODENOSCOPY (EGD);  Surgeon:               Manya Silvas, MD;  Location: [facility] ENDOSCOPY;                Service: Endoscopy;  Laterality: N/A; 03/06/2019: ESOPHAGOGASTRODUODENOSCOPY; N/A     Comment:  Procedure: ESOPHAGOGASTRODUODENOSCOPY (EGD);  Surgeon:               [place], Benay Pike, MD;  Location: [facility] ENDOSCOPY;                Service: Gastroenterology;   Laterality: N/A; 07/20/2015: ESOPHAGOGASTRODUODENOSCOPY (EGD) WITH PROPOFOL; N/A     Comment:  Procedure: ESOPHAGOGASTRODUODENOSCOPY (EGD) WITH               PROPOFOL;  Surgeon: Manya Silvas, MD;  Location: [facility]              ENDOSCOPY;  Service: Endoscopy;  Laterality: N/A; 09/16/2015: ESOPHAGOGASTRODUODENOSCOPY (EGD) WITH PROPOFOL; N/A     Comment:  Procedure: ESOPHAGOGASTRODUODENOSCOPY (EGD) WITH               PROPOFOL;  Surgeon: Manya Silvas, MD;  Location: [facility]  ENDOSCOPY;  Service: Endoscopy;  Laterality: N/A; 03/16/2016: ESOPHAGOGASTRODUODENOSCOPY (EGD) WITH PROPOFOL; N/A     Comment:  Procedure: ESOPHAGOGASTRODUODENOSCOPY (EGD) WITH               PROPOFOL;  Surgeon: Manya Silvas, MD;  Location: [facility]              ENDOSCOPY;  Service: Endoscopy;  Laterality: N/A; 09/14/2016: ESOPHAGOGASTRODUODENOSCOPY (EGD) WITH PROPOFOL; N/A     Comment:  Procedure: ESOPHAGOGASTRODUODENOSCOPY (EGD) WITH               PROPOFOL;  Surgeon: Manya Silvas, MD;  Location: [facility]              ENDOSCOPY;  Service: Endoscopy;  Laterality: N/A; 11/05/2016: ESOPHAGOGASTRODUODENOSCOPY (EGD) WITH PROPOFOL; N/A     Comment:  Procedure: ESOPHAGOGASTRODUODENOSCOPY (EGD) WITH               PROPOFOL;  Surgeon: Manya Silvas, MD;  Location: [facility]              ENDOSCOPY;  Service: Endoscopy;  Laterality: N/A; 02/06/2017: ESOPHAGOGASTRODUODENOSCOPY (EGD) WITH PROPOFOL; N/A     Comment:  Procedure: ESOPHAGOGASTRODUODENOSCOPY (EGD) WITH               PROPOFOL;  Surgeon: Manya Silvas, MD;  Location:               [facility] ENDOSCOPY;  Service: Endoscopy;  Laterality: N/A; 04/24/2017: ESOPHAGOGASTRODUODENOSCOPY (EGD) WITH PROPOFOL; N/A     Comment:  Procedure: ESOPHAGOGASTRODUODENOSCOPY (EGD) WITH               PROPOFOL;  Surgeon: Manya Silvas, MD;  Location:               [facility] ENDOSCOPY;  Service: Endoscopy;  Laterality: N/A; 07/31/2017: ESOPHAGOGASTRODUODENOSCOPY (EGD) WITH PROPOFOL; N/A      Comment:  Procedure: ESOPHAGOGASTRODUODENOSCOPY (EGD) WITH               PROPOFOL;  Surgeon: Manya Silvas, MD;  Location:               [facility] ENDOSCOPY;  Service: Endoscopy;  Laterality: N/A; 08/08/2018: ESOPHAGOGASTRODUODENOSCOPY (EGD) WITH PROPOFOL; N/A     Comment:  Procedure: ESOPHAGOGASTRODUODENOSCOPY (EGD) WITH               PROPOFOL;  Surgeon: Manya Silvas, MD;  Location:               [facility] ENDOSCOPY;  Service: Endoscopy;  Laterality: N/A; 05/21/2020: ESOPHAGOGASTRODUODENOSCOPY (EGD) WITH PROPOFOL; N/A     Comment:  Procedure: ESOPHAGOGASTRODUODENOSCOPY (EGD) WITH               PROPOFOL;  Surgeon: [name] Manifold, MD;  Location:               [facility] ENDOSCOPY;  Service: Endoscopy;  Laterality: N/A; 12/05/2017: GIVENS CAPSULE STUDY; N/A     Comment:  Procedure: GIVENS CAPSULE STUDY;  Surgeon: Manya Silvas, MD;  Location: [facility] ENDOSCOPY;  Service:               Endoscopy;  Laterality: N/A; No date: TONSILLECTOMY No date: TONSILLECTOMY AND ADENOIDECTOMY No date: ULNAR NERVE TRANSPOSITION No date: UVULOPALATOPHARYNGOPLASTY BMI    Body Mass Index: 42.83 kg/m     Reproductive/Obstetrics negative OB ROS  Anesthesia Physical  Anesthesia Plan  ASA: II  Anesthesia Plan: General   Post-op Pain Management:    Induction: Intravenous  PONV Risk Score and Plan: 2 and Ondansetron, Propofol infusion and TIVA  Airway Management Planned: Natural Airway  Additional Equipment: None  Intra-op Plan:   Post-operative Plan:   Informed Consent: I have reviewed the patients History and Physical, chart, labs and discussed the procedure including the risks, benefits and alternatives for the proposed anesthesia with the patient or authorized representative who has indicated his/her understanding and acceptance.     Dental advisory given  Plan Discussed with: CRNA and Surgeon  Anesthesia Plan Comments:  (Discussed risks of anesthesia with patient, including possibility of difficulty with spontaneous ventilation under anesthesia necessitating airway intervention, PONV, and rare risks such as cardiac or respiratory or neurological events. Patient understands. Patient informed about increased incidence of above perioperative risk due to high BMI. Patient understands. )        Anesthesia Quick Evaluation

## 2020-07-28 NOTE — H&P (Signed)
Outpatient short stay form Pre-procedure 07/28/2020 1:42 PM Raylene Miyamoto MD, MPH  Primary Physician: Dr. 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    Current Facility-Administered Medications:  .  [MAR Hold] 0.9 %  sodium chloride infusion, 10 mL/hr, Intravenous, Once, Duffy Bruce, MD .  Doug Sou Hold] albumin human 25 % solution 25 g, 25 g, Intravenous, Q12H, Lateef, Munsoor, MD, Last Rate: 100 mL/hr at 07/28/20 1207, 25 g at 07/28/20 1207 .  [MAR Hold] insulin aspart (novoLOG) injection 0-9 Units, 0-9 Units, Subcutaneous, Q6H, Howerter, Justin B, DO, 3 Units at 07/28/20 0031 .  [MAR Hold] insulin detemir (LEVEMIR) injection 5 Units, 5 Units, Subcutaneous, BID, Howerter, Justin B, DO, 5 Units at 07/28/20 1208 .  [MAR Hold] lactulose (CHRONULAC) 10 GM/15ML solution 30 g, 30 g, Oral, BID, Pokhrel, Laxman, MD, 30 g at 07/27/20 2100 .  [MAR Hold] LORazepam (ATIVAN) tablet 0.5 mg, 0.5 mg, Oral, TID PRN, Howerter, Justin B, DO, 0.5 mg at 07/27/20 1719 .  [MAR Hold] ondansetron (ZOFRAN) injection 4 mg, 4 mg, Intravenous, Q6H PRN, Sharion Settler, NP, 4 mg at 07/27/20 6144 .  [MAR Hold] pantoprazole (PROTONIX) injection 40 mg, 40 mg, Intravenous, Q12H, Howerter, Justin B, DO, 40 mg at 07/28/20 1209 .  [MAR Hold] rifaximin (XIFAXAN) tablet 550 mg, 550 mg, Oral, BID, Howerter, Justin B, DO, 550 mg at 07/27/20 2056  Medications Prior to Admission  Medication Sig Dispense Refill Last Dose  . atorvastatin (LIPITOR) 20 MG tablet Take 20 mg by mouth daily.   07/24/2020 at 1800  . ferrous sulfate 325 (65 FE) MG tablet Take 1 tablet (325 mg total) by mouth daily. This medication can cause black stools 60 tablet 1 07/25/2020 at 0600  . hydrALAZINE (APRESOLINE) 50 MG tablet Take 1 tablet (50 mg  total) by mouth 2 (two) times daily. 60 tablet 1 07/25/2020 at 0600  . lactulose (CHRONULAC) 10 GM/15ML solution Take 90 mLs (60 g total) by mouth daily. (Patient taking differently: Take 45 g by mouth 2 (two) times daily. ) 236 mL 0 Past Week at Unknown time  . LORazepam (ATIVAN) 0.5 MG tablet Take 1 tablet (0.5 mg total) by mouth 3 (three) times daily as needed for Anxiety   07/25/2020 at 0600  . NOVOLOG MIX 70/30 FLEXPEN (70-30) 100 UNIT/ML FlexPen 0-84 Units as directed. Inject 0 - 84 Units (sliding scale) Subcutaneously twice a day before meals, at breakfast and at dinner.   07/25/2020 at 0600  . pantoprazole (PROTONIX) 40 MG tablet Take 1 tablet (40 mg total) by mouth 2 (two) times daily. 60 tablet 11 07/25/2020 at 0600  . potassium chloride SA (KLOR-CON) 20 MEQ tablet Take 20 mEq by mouth 2 (two) times daily.   07/25/2020 at 0600  . rifaximin (XIFAXAN) 550 MG TABS tablet Take 1 tablet (550 mg total) by mouth 2 (two) times daily. 60 tablet 0 07/25/2020 at 0600  . tamsulosin (FLOMAX) 0.4 MG CAPS capsule Take 1 capsule (0.4 mg total) by mouth daily after supper. 30 capsule 1 07/24/2020 at 1800  . atorvastatin (LIPITOR) 10 MG tablet Take 20 mg by mouth every evening.      . Ipratropium-Albuterol (COMBIVENT RESPIMAT) 20-100 MCG/ACT AERS respimat Inhale 1 puff into the lungs every 6 (six) hours. (Patient not taking: Reported on 07/25/2020) 4  g 0 Not Taking at Unknown time  . metolazone (ZAROXOLYN) 5 MG tablet Take 5 mg by mouth daily. .   14+ days  . sodium chloride (OCEAN) 0.65 % SOLN nasal spray Place 1 spray into both nostrils as needed for congestion (nose irritation). 60 mL 0 unknown at prn  . torsemide (DEMADEX) 100 MG tablet Take 1 tablet (100 mg total) by mouth daily. 30 tablet 1 14+ days     No Known Allergies   Past Medical History:  Diagnosis Date  . A-fib ([place])   . Anemia   . Anxiety    controlled;   . Arthritis   . AVM (arteriovenous malformation) of stomach, acquired with  hemorrhage   . Barrett's esophagus   . CHF (congestive heart failure) ([place])   . Chronic kidney disease    renal infufficiency  . Cirrhosis ([place])   . COPD (chronic obstructive pulmonary disease) ([place])   . Depression    controlled;   Marland Kitchen Diabetes mellitus without complication ([place])    not controlled, taking insulin but sugar continues to run high;   Marland Kitchen Dysrhythmia   . Edema   . Esophageal varices ([place])   . GAVE (gastric antral vascular ectasia)   . GERD (gastroesophageal reflux disease)   . Hepatic encephalopathy ([place])   . History of hiatal hernia   . Hyperlipidemia   . Hypertension    controlled well;   . IPMN (intraductal papillary mucinous neoplasm)   . Liver cirrhosis ([place])   . 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 Obesity   . Pancytopenia ([place])   . Polyp, stomach    with chronic blood loss  . Portal hypertensive gastropathy ([place])   . Sleep apnea    does not wear a cpap, Medicare would not pay for it   . Venous stasis dermatitis of both lower extremities   . Venous stasis of both lower extremities    with cellulitis    Review of systems:  Otherwise negative.    Physical Exam  Gen: Alert, oriented. Appears stated age.  HEENT: PERRL Lungs: no respiratory distress CV: RRR Abd: soft, benign, no masses.  Ext: No edema. Pulses 2+    Planned procedures: Proceed with EGD. The patient understands the nature of the planned procedure, indications, risks, alternatives and potential complications including but not limited to bleeding, infection, perforation, damage to internal organs and possible oversedation/side effects from anesthesia. The patient agrees and gives consent to proceed.  Please refer to procedure notes for findings, recommendations and patient disposition/instructions.     Raylene Miyamoto MD, MPH Gastroenterology 07/28/2020  1:42 PM

## 2020-07-28 NOTE — Anesthesia Postprocedure Evaluation (Signed)
Anesthesia Post Note  Patient: Corey West  Procedure(s) Performed: ESOPHAGOGASTRODUODENOSCOPY (EGD) WITH PROPOFOL (N/A )  Patient location during evaluation: Endoscopy Anesthesia Type: General Level of consciousness: awake and alert Pain management: pain level controlled Vital Signs Assessment: post-procedure vital signs reviewed and stable Respiratory status: spontaneous breathing, nonlabored ventilation, respiratory function stable and patient connected to nasal cannula oxygen Cardiovascular status: blood pressure returned to baseline and stable Postop Assessment: no apparent nausea or vomiting Anesthetic complications: no   No complications documented.   Last Vitals:  Vitals:   07/28/20 1448 07/28/20 1458  BP: (!) 153/70 (!) 145/56  Pulse: 60 61  Resp: 20 18  Temp:    SpO2: 100% 100%    Last Pain:  Vitals:   07/28/20 1458  TempSrc:   PainSc: 0-No pain                 Arita Miss

## 2020-07-28 NOTE — Progress Notes (Signed)
PROGRESS NOTE    Corey West  DXA:[id number] DOB: [birth year] DOA: 07/25/2020 PCP: Leonel Ramsay, MD    Chief Complaint  Patient presents with  . Abnormal Lab  . 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  He does have baseline shortness of breath without recent worsening.  Patient follows withDr. Lissa Hoard his outpatient nephrologist.   Patient was recently taken off his Lasix due to trending up creatinine.  Patient has a history of upper GI bleed, EGD performed byDr. Lysle Pearl 07/18/2020 notable for gastric antral vascular ectasias/plaser therapy,without overt source of active bleed  In the ED, he was mildly tachypneic.  Hemoglobin was 6.8 with MCV of 93.  Platelet was 111.  COVID-19 and flu was negative.  Chest x-ray showed stable cardiomegaly  Subjective:  He is sleepy this am, appear confused He denies pain  Assessment & Plan:   Principal Problem:   Acute on chronic anemia Active Problems:   Cirrhosis ([place])   Acute kidney injury superimposed on CKD (HCC)   Type 2 diabetes mellitus with diabetic nephropathy, with long-term current use of insulin (HCC)   Chronic diastolic CHF (congestive heart failure) (HCC)   Generalized weakness   GI bleed  Acute on chronic anemia: Patient does have history of GI bleed in the past.  Likely subacute/chronic GI bleed with underlying chronic liver disease.  History of  multiple EGD and capsule endoscopy.  Last endoscopy on 07/18/2020.  Not on any blood thinners.   Currently on Protonix IV twice daily EGD planned this pm, management per GI   Acute kidney injury on stage IV chronic kidney disease: baseline creatinine of 2.7-2.9, presenting creatinine noted to be 3.39 Creatinine appear repeat at 3.6 and BUN of here peak at 106 BUN 100 creatinine 3.3 today UA was ordered but never collected, will reorder UA, will also get bladder US Nephrology following, appreciate input  Cirrhosis of liver with history of portal hypertension esophageal varices, hepatic encephalopathy and chronic thrombocytopenia   Lasix was discontinued recently due to rising creatinine levels.  He is not on spironolactone.  Currently on lactulose and rifaximin.  INR of 1.3.-1.4 Appears confused today, will check LFT and ammonia level  Chronic diastolic heart failure: Echocardiogram done on July 2021 with LVEF 50 to 55%, no evidence of focal wall motion abnormalities, grade 2 diastolic dysfunction.   Chest x-ray with stable cardiomegaly and mild pulmonary vascular congestion Generalized edema anasarca, off diuretics due to elevated renal function, continue bilateral Unna boots ( reports getting it changed twice a week at home, last changed on 10/26) Getting blood product and IV albumin Close monitor volume status  Type 2 diabetes mellitus with diabetic nephropathy. on  NovoLog 70/30 as outpatient.  On Levemir while in the hospital with sliding scale insulin and Accu-Cheks.'   Class III obesity Body mass index is 46.03 kg/m. Will benefit outpatient sleep study  DVT prophylaxis: none due to blood loss anemia, getting bilateral unna boots   Code Status:full Family Communication: patient Disposition:   Status is: Inpatient  Dispo: The patient  is from: home              Anticipated d/c is to: TBD              Anticipated d/c date is: TBS              Patient currently not  medically ready to discharge  Consultants:   Nephrology  GI  Procedures:   None  Antimicrobials:   Rifaximin     Objective: Vitals:   07/28/20 0500 07/28/20 0506 07/28/20 0839 07/28/20 1206  BP:  (!) 145/79 (!) 153/67 (!) 155/74  Pulse:  (!) 53 (!) 54 (!) 57  Resp:  20 20 20   Temp:  97.7 F (36.5 C) 98.1 F (36.7 C) 97.7 F (36.5 C)  TempSrc:   Oral Oral  SpO2:  100% 100% 100%  Weight: (!) 141.4 kg       Intake/Output Summary (Last 24 hours) at 07/28/2020 1210 Last data filed at 07/28/2020 0300 Gross per 24 hour  Intake 213.05 ml  Output --  Net 213.05 ml   Filed Weights   07/25/20 2345 07/27/20 0633 07/28/20 0500  Weight: (!) 137 kg (!) 139.8 kg (!) 141.4 kg    Examination:  General exam: Chronically ill appearing, generalized anasarca, obese, appears confused today Respiratory system: diminished at basis, no wheezing, no rales, no rhonchi, Respiratory effort normal. Cardiovascular system: S1 & S2 heard, RRR.  Gastrointestinal system: Abdomen is nondistended, soft and nontender. . Normal bowel sounds heard. Central nervous system: Alert and oriented. No focal neurological deficits. Extremities: generalized weakness Skin: bilateral lower extremity unna boots on  Psychiatry: Judgement and insight appear normal. Mood & affect appropriate.     Data Reviewed: I have personally reviewed following labs and imaging studies  CBC: Recent Labs  Lab 07/25/20 0040 07/25/20 1344 07/26/20 0040 07/26/20 1143 07/28/20 0435  WBC  --  5.5  --  5.4 4.0  NEUTROABS  --   --   --   --  3.1  HGB 7.0* 6.8* 6.6* 7.4* 7.3*  HCT 21.4* 20.7* 20.8* 22.4* 22.7*  MCV  --  92.8  --  91.1 93.8  PLT  --  111*  --  97* 87*    Basic Metabolic Panel: Recent Labs  Lab 07/25/20 1344 07/26/20 0040 07/26/20 1143 07/27/20 0752 07/28/20 0435  NA 137  --  135 136 139  K 3.2*  --  3.1* 3.8 4.0  CL 101  --  100 102 104  CO2 24  --  24 24 24   GLUCOSE 157*  --  225* 212* 174*   BUN 113*  --  105* 106* 100*  CREATININE 3.39*  --  3.28* 3.61* 3.33*  CALCIUM 8.6*  --  8.3* 8.2* 8.5*  MG  --  2.1 2.2  --   --   PHOS  --   --  5.8*  --   --     GFR: Estimated Creatinine Clearance: 29.7 mL/min (A) (by C-G formula based on SCr of 3.33 mg/dL (H)).  Liver Function Tests: Recent Labs  Lab 07/25/20 1344 07/26/20 1143  AST 35 30  ALT 25 23  ALKPHOS 80 69  BILITOT 0.8 1.0  PROT 6.4* 5.9*  ALBUMIN 2.7* 2.5*    CBG: Recent Labs  Lab 07/27/20 1157 07/27/20 1801 07/28/20 0012 07/28/20 0601 07/28/20 1204  GLUCAP 192* 248* 214* 136* 131*     Recent Results (from the past 240 hour(s))  Respiratory Panel by RT  PCR (Flu A&B, Covid) - Nasopharyngeal Swab     Status: None   Collection Time: 07/25/20  6:22 PM   Specimen: Nasopharyngeal Swab  Result Value Ref Range Status   SARS Coronavirus 2 by RT PCR NEGATIVE NEGATIVE Final    Comment: (NOTE) SARS-CoV-2 target nucleic acids are NOT DETECTED.  The SARS-CoV-2 RNA is generally detectable in upper respiratoy specimens during the acute phase of infection. The lowest concentration of SARS-CoV-2 viral copies this assay can detect is 131 copies/mL. A negative result does not preclude SARS-Cov-2 infection and should not be used as the sole basis for treatment or other patient management decisions. A negative result may occur with  improper specimen collection/handling, submission of specimen other than nasopharyngeal swab, presence of viral mutation(s) within the areas targeted by this assay, and inadequate number of viral copies (<131 copies/mL). A negative result must be combined with clinical observations, patient history, and epidemiological information. The expected result is Negative.  Fact Sheet for Patients:  PinkCheek.be  Fact Sheet for Healthcare Providers:  GravelBags.it  This test is no t yet approved or cleared by the [place] FDA and   has been authorized for detection and/or diagnosis of SARS-CoV-2 by FDA under an Emergency Use Authorization (EUA). This EUA will remain  in effect (meaning this test can be used) for the duration of the COVID-19 declaration under Section 564(b)(1) of the Act, 21 U.S.C. section 360bbb-3(b)(1), unless the authorization is terminated or revoked sooner.     Influenza A by PCR NEGATIVE NEGATIVE Final   Influenza B by PCR NEGATIVE NEGATIVE Final    Comment: (NOTE) The Xpert Xpress SARS-CoV-2/FLU/RSV assay is intended as an aid in  the diagnosis of influenza from Nasopharyngeal swab specimens and  should not be used as a sole basis for treatment. Nasal washings and  aspirates are unacceptable for Xpert Xpress SARS-CoV-2/FLU/RSV  testing.  Fact Sheet for Patients: PinkCheek.be  Fact Sheet for Healthcare Providers: GravelBags.it  This test is not yet approved or cleared by the [place] FDA and  has been authorized for detection and/or diagnosis of SARS-CoV-2 by  FDA under an Emergency Use Authorization (EUA). This EUA will remain  in effect (meaning this test can be used) for the duration of the  Covid-19 declaration under Section 564(b)(1) of the Act, 21  U.S.C. section 360bbb-3(b)(1), unless the authorization is  terminated or revoked. Performed at [facility], [address], [place], [place] [address]          Radiology Studies: No results found.      Scheduled Meds: . insulin aspart  0-9 Units Subcutaneous Q6H  . insulin detemir  5 Units Subcutaneous BID  . lactulose  30 g Oral BID  . pantoprazole (PROTONIX) IV  40 mg Intravenous Q12H  . rifaximin  550 mg Oral BID   Continuous Infusions: . sodium chloride    . albumin human 25 g (07/28/20 1207)     LOS: 2 days   Time spent: 78mins Greater than 50% of this time was spent in counseling, explanation of diagnosis, planning of further  management, and coordination of care.  I have personally reviewed and interpreted on  07/28/2020 daily labs, I reviewed all nursing notes, pharmacy notes, consultant notes,  vitals, pertinent old records  I have discussed plan of care as described above with RN , patient on 07/28/2020  Voice Recognition /Dragon dictation system was used to create this note, attempts have been made to correct errors. Please contact  the author with questions and/or clarifications.   Florencia Reasons, MD PhD FACP [facility]  Available via Epic secure chat 7am-7pm for nonurgent issues Please page for urgent issues To page the attending provider between 7A-7P or the covering provider during after hours 7P-7A, please log into the web site [email or web address] and access using universal [place] password for that web site. If you do not have the password, please call the hospital operator.    07/28/2020, 12:10 PM

## 2020-07-28 NOTE — TOC Progression Note (Addendum)
Transition of Care [facility]) - Progression Note    Patient Details  Name: Corey West MRN: [id number] Date of Birth: 08-Oct-1950  [facility] CM/SW [place], LCSW Phone Number: 07/28/2020, 1:37 PM  Clinical Narrative:   Updated by [name] with [facility] that they cannot accept patient back for [facility] at discharge.   Spoke with MD. Patient not doing well medically, not close to discharge at this time. Patient presenting with confusion which is not his baseline per [facility] agency. At this point patient would not be safe to return home alone. Will continue to follow.     Expected Discharge Plan: [place] Barriers to Discharge: Continued Medical Work up  Expected Discharge Plan and Services Expected Discharge Plan: [place]       Living arrangements for the past 2 months: Single Family Home                                       Social Determinants of Health (SDOH) Interventions    Readmission Risk Interventions Readmission Risk Prevention Plan 07/27/2020 06/09/2020 02/02/2020  Transportation Screening Complete Complete Complete  PCP or Specialist Appt within 3-5 Days - - Complete  HRI or [place] - - Complete  Social Work Consult for [place] Planning/Counseling - - Complete  Palliative Care Screening - - Not Applicable  Medication Review Press photographer) Complete Complete Complete  PCP or Specialist appointment within 3-5 days of discharge Complete Complete -  [place] or Home Care Consult Complete Complete -  SW Recovery Care/Counseling Consult Complete Complete -  SW Consult Not Complete Comments - - -  Palliative Care Screening Not Applicable Not Applicable -  [place] Complete Not Applicable -  Some recent data might be hidden

## 2020-07-28 NOTE — Care Plan (Signed)
EGD performed. No obvious GAVE but had severe portal hypertensive gastropathy with oozing on contact.  - Recommend PO iron therapy - if no contraindication can try low dose beta blocker with coreg 3.125 BID to help try reduce portal pressures and therefore reduce portal hypertensive gastropathy - titrate lactulose for 2-3 bowel movements daily - please check daily CMP's and INR's - would consider palliative care consult. Patient has had several recent hospitalizations.  Will continue to follow along.  Raylene Miyamoto MD, MPH

## 2020-07-28 NOTE — Transfer of Care (Signed)
Immediate Anesthesia Transfer of Care Note  Patient: Corey West  Procedure(s) Performed: ESOPHAGOGASTRODUODENOSCOPY (EGD) WITH PROPOFOL (N/A )  Patient Location: Endoscopy Unit  Anesthesia Type:General  Level of Consciousness: drowsy, patient cooperative and responds to stimulation  Airway & Oxygen Therapy: Patient Spontanous Breathing and Patient connected to face mask oxygen  Post-op Assessment: Report given to RN and Post -op Vital signs reviewed and stable  Post vital signs: Reviewed and stable  Last Vitals:  Vitals Value Taken Time  BP 149/64 07/28/20 1428  Temp 36.3 C 07/28/20 1428  Pulse 55 07/28/20 1432  Resp 14 07/28/20 1432  SpO2 100 % 07/28/20 1432  Vitals shown include unvalidated device data.  Last Pain:  Vitals:   07/28/20 1428  TempSrc: Temporal  PainSc: Asleep         Complications: No complications documented.

## 2020-07-28 NOTE — Op Note (Signed)
[facility] Gastroenterology Patient Name: Corey West Procedure Date: 07/28/2020 1:48 PM MRN: [id number] Account #: [id number] Date of Birth: 1951/09/08 Admit Type: Inpatient Age: 69 Room: [facility] ENDO ROOM 4 Gender: Male Note Status: Finalized Procedure:             Upper GI endoscopy Indications:           Suspected upper gastrointestinal bleeding in patient                         with chronic blood loss Providers:             Andrey Farmer MD, MD Referring MD:          Adrian Prows (Referring MD) Medicines:             Monitored Anesthesia Care Complications:         No immediate complications. Procedure:             Pre-Anesthesia Assessment:                        - Prior to the procedure, a History and Physical was                         performed, and patient medications and allergies were                         reviewed. The patient is competent. The risks and                         benefits of the procedure and the sedation options and                         risks were discussed with the patient. All questions                         were answered and informed consent was obtained.                         Patient identification and proposed procedure were                         verified by the physician, the nurse, the anesthetist                         and the technician in the endoscopy suite. Mental                         Status Examination: alert and oriented. Airway                         Examination: normal oropharyngeal airway and neck                         mobility. Respiratory Examination: clear to                         auscultation. CV Examination: normal. Prophylactic  Antibiotics: The patient does not require prophylactic                         antibiotics. Prior Anticoagulants: The patient has                         taken no previous anticoagulant or antiplatelet                         agents. ASA  Grade Assessment: IV - A patient with                         severe systemic disease that is a constant threat to                         life. After reviewing the risks and benefits, the                         patient was deemed in satisfactory condition to                         undergo the procedure. The anesthesia plan was to use                         monitored anesthesia care (MAC). Immediately prior to                         administration of medications, the patient was                         re-assessed for adequacy to receive sedatives. The                         heart rate, respiratory rate, oxygen saturations,                         blood pressure, adequacy of pulmonary ventilation, and                         response to care were monitored throughout the                         procedure. The physical status of the patient was                         re-assessed after the procedure.                        After obtaining informed consent, the endoscope was                         passed under direct vision. Throughout the procedure,                         the patient's blood pressure, pulse, and oxygen                         saturations were monitored continuously. The Endoscope  was introduced through the mouth, and advanced to the                         second part of duodenum. The upper GI endoscopy was                         accomplished without difficulty. The patient tolerated                         the procedure well. Findings:      There were esophageal mucosal changes secondary to established Barrett's       disease present in the lower third of the esophagus. 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 No obvious GAVE noted in antrum.      The examined duodenum was  normal. Impression:            - Esophageal mucosal changes secondary to established                         Barrett's disease.                        - Portal hypertensive gastropathy.                        - Normal examined duodenum.                        - No specimens collected. Recommendation:        - Return patient to hospital ward for ongoing care.                        - Resume previous diet.                        - Continue present medications. Consider coreg 3.125                         mg BID for portaly hypertensive gastropathy.                        - Needs to be on PO iron therapy Procedure Code(s):     --- Professional ---                        [phone], Esophagogastroduodenoscopy, flexible,                         transoral; diagnostic, including collection of                         specimen(s) by brushing or washing, when performed                         (separate procedure) Diagnosis Code(s):     --- Professional ---  K22.70, Barrett's esophagus without dysplasia                        K76.6, Portal hypertension                        K31.89, Other diseases of stomach and duodenum                        R58, Hemorrhage, not elsewhere classified CPT copyright 2019 American Medical Association. All rights reserved. The codes documented in this report are preliminary and upon coder review may  be revised to meet current compliance requirements. Andrey Farmer, MD Andrey Farmer MD, MD 07/28/2020 2:29:16 PM Number of Addenda: 0 Note Initiated On: 07/28/2020 1:48 PM Estimated Blood Loss:  Estimated blood loss: none.      [facility]

## 2020-07-28 NOTE — Anesthesia Procedure Notes (Signed)
Procedure Name: General with mask airway Performed by: Fletcher-Harrison, Timon Geissinger, CRNA Pre-anesthesia Checklist: Patient identified, Emergency Drugs available, Suction available and Patient being monitored Patient Re-evaluated:Patient Re-evaluated prior to induction Oxygen Delivery Method: Simple face mask Induction Type: IV induction Placement Confirmation: CO2 detector and positive ETCO2 Dental Injury: Teeth and Oropharynx as per pre-operative assessment        

## 2020-07-28 NOTE — Progress Notes (Signed)
Cancellation Note:  Pt currently not in room; per chart pt off floor for procedure.  Will re-attempt PT evaluation at a later date/time.  [name], PT 07/28/20, 2:29 PM

## 2020-07-28 NOTE — Interval H&P Note (Signed)
History and Physical Interval Note:  07/28/2020 1:45 PM  Corey West  has presented today for surgery, with the diagnosis of Anemia and history of GAVE.  The various methods of treatment have been discussed with the patient and family. After consideration of risks, benefits and other options for treatment, the patient has consented to  Procedure(s): ESOPHAGOGASTRODUODENOSCOPY (EGD) WITH PROPOFOL (N/A) as a surgical intervention.  The patient's history has been reviewed, patient examined, no change in status, stable for surgery.  I have reviewed the patient's chart and labs.  Questions were answered to the patient's satisfaction.     Lesly Rubenstein  Ok to proceed with EGD.

## 2020-07-29 DIAGNOSIS — N4883 Acquired buried penis: Secondary | ICD-10-CM | POA: Diagnosis not present

## 2020-07-29 DIAGNOSIS — R350 Frequency of micturition: Secondary | ICD-10-CM

## 2020-07-29 DIAGNOSIS — D649 Anemia, unspecified: Secondary | ICD-10-CM | POA: Diagnosis not present

## 2020-07-29 DIAGNOSIS — N433 Hydrocele, unspecified: Secondary | ICD-10-CM

## 2020-07-29 LAB — COMPREHENSIVE METABOLIC PANEL
ALT: 19 U/L (ref 0–44)
AST: 24 U/L (ref 15–41)
Albumin: 3 g/dL — ABNORMAL LOW (ref 3.5–5.0)
Alkaline Phosphatase: 74 U/L (ref 38–126)
Anion gap: 11 (ref 5–15)
BUN: 107 mg/dL — ABNORMAL HIGH (ref 8–23)
CO2: 25 mmol/L (ref 22–32)
Calcium: 8.9 mg/dL (ref 8.9–10.3)
Chloride: 107 mmol/L (ref 98–111)
Creatinine, Ser: 3.06 mg/dL — ABNORMAL HIGH (ref 0.61–1.24)
GFR, Estimated: 21 mL/min — ABNORMAL LOW (ref >60)
Glucose, Bld: 120 mg/dL — ABNORMAL HIGH (ref 70–99)
Potassium: 3.9 mmol/L (ref 3.5–5.1)
Sodium: 143 mmol/L (ref 135–145)
Total Bilirubin: 1.4 mg/dL — ABNORMAL HIGH (ref 0.3–1.2)
Total Protein: 6.2 g/dL — ABNORMAL LOW (ref 6.5–8.1)

## 2020-07-29 LAB — CBC
HCT: 22.7 % — ABNORMAL LOW (ref 39.0–52.0)
Hemoglobin: 7.4 g/dL — ABNORMAL LOW (ref 13.0–17.0)
MCH: 30.7 pg (ref 26.0–34.0)
MCHC: 32.6 g/dL (ref 30.0–36.0)
MCV: 94.2 fL (ref 80.0–100.0)
Platelets: 84 10*3/uL — ABNORMAL LOW (ref 150–400)
RBC: 2.41 MIL/uL — ABNORMAL LOW (ref 4.22–5.81)
RDW: 16.3 % — ABNORMAL HIGH (ref 11.5–15.5)
WBC: 4.7 10*3/uL (ref 4.0–10.5)
nRBC: 0 % (ref 0.0–0.2)

## 2020-07-29 LAB — TYPE AND SCREEN
ABO/RH(D): A NEG
Antibody Screen: NEGATIVE
Unit division: 0
Unit division: 0
Unit division: 0

## 2020-07-29 LAB — BPAM RBC
Blood Product Expiration Date: 202110282359
Blood Product Expiration Date: 202111102359
Blood Product Expiration Date: 202111102359
ISSUE DATE / TIME: 202110251812
ISSUE DATE / TIME: 202110260540
Unit Type and Rh: 600
Unit Type and Rh: 600
Unit Type and Rh: 600

## 2020-07-29 LAB — GLUCOSE, CAPILLARY
Glucose-Capillary: 114 mg/dL — ABNORMAL HIGH (ref 70–99)
Glucose-Capillary: 114 mg/dL — ABNORMAL HIGH (ref 70–99)
Glucose-Capillary: 129 mg/dL — ABNORMAL HIGH (ref 70–99)
Glucose-Capillary: 132 mg/dL — ABNORMAL HIGH (ref 70–99)
Glucose-Capillary: 218 mg/dL — ABNORMAL HIGH (ref 70–99)
Glucose-Capillary: 219 mg/dL — ABNORMAL HIGH (ref 70–99)

## 2020-07-29 LAB — AMMONIA: Ammonia: 73 umol/L — ABNORMAL HIGH (ref 9–35)

## 2020-07-29 MED ORDER — INSULIN ASPART 100 UNIT/ML ~~LOC~~ SOLN
0.0000 [IU] | Freq: Three times a day (TID) | SUBCUTANEOUS | Status: DC
  Administered 2020-07-29: 3 [IU] via SUBCUTANEOUS
  Administered 2020-07-29 – 2020-07-30 (×3): 1 [IU] via SUBCUTANEOUS
  Filled 2020-07-29 (×3): qty 1

## 2020-07-29 MED ORDER — CARVEDILOL 3.125 MG PO TABS
3.1250 mg | ORAL_TABLET | Freq: Two times a day (BID) | ORAL | Status: DC
  Administered 2020-07-29 – 2020-07-30 (×2): 3.125 mg via ORAL
  Filled 2020-07-29 (×2): qty 1

## 2020-07-29 NOTE — Consult Note (Signed)
Consultation: infrequent urination, hydrocele, buried penis Requested by: Dr. Florencia Reasons  History of Present Illness: Corey West is a 69 year old male with a history of cirrhosis admitted for weakness.  He underwent EGD yesterday.  He has some obesity, ascites on CT as well as a large right hydrocele.  Because of all of this he is not standing to void.  He is voiding into a pouch connected to a pure wick type device with a suction canister on the wall.  He had good urine output overnight of about 700 cc and about 100 cc in the canister this morning.  He has not voided for most of the day but has not had the urge nor the inability to void.  He has had no bladder pain or dysuria.  He is not having to strain to void or a weak stream.  Prior to my arrival he voided about 200 cc into the canister with clear urine somewhat of a dark straw color.  CT scan from September 2021 showed ascites, and a large scrotum and large right hydrocele, normal prostate.  Past Medical History:  Diagnosis Date  . A-fib ([place])   . Anemia   . Anxiety    controlled;   . Arthritis   . AVM (arteriovenous malformation) of stomach, acquired with hemorrhage   . Barrett's esophagus   . CHF (congestive heart failure) ([place])   . Chronic kidney disease    renal infufficiency  . Cirrhosis ([place])   . COPD (chronic obstructive pulmonary disease) ([place])   . Depression    controlled;   Marland Kitchen Diabetes mellitus without complication ([place])    not controlled, taking insulin but sugar continues to run high;   Marland Kitchen Dysrhythmia   . Edema   . Esophageal varices ([place])   . GAVE (gastric antral vascular ectasia)   . GERD (gastroesophageal reflux disease)   . Hepatic encephalopathy ([place])   . History of hiatal hernia   . Hyperlipidemia   . Hypertension    controlled well;   . IPMN (intraductal papillary mucinous neoplasm)   . Liver cirrhosis ([place])   . Nephropathy, diabetic ([place])   . Obesity   . Pancytopenia ([place])   . Polyp, stomach    with  chronic blood loss  . Portal hypertensive gastropathy ([employer])   . Sleep apnea    does not wear a cpap, Medicare would not pay for it   . Venous stasis dermatitis of both lower extremities   . Venous stasis of both lower extremities    with cellulitis   Past Surgical History:  Procedure Laterality Date  . ESOPHAGOGASTRODUODENOSCOPY N/A 02/09/2015   Procedure: ESOPHAGOGASTRODUODENOSCOPY (EGD);  Surgeon: Manya Silvas, MD;  Location: [facility] ENDOSCOPY;  Service: Endoscopy;  Laterality: N/A;  . ESOPHAGOGASTRODUODENOSCOPY N/A 07/22/2015   Procedure: ESOPHAGOGASTRODUODENOSCOPY (EGD);  Surgeon: Manya Silvas, MD;  Location: [facility] ENDOSCOPY;  Service: Endoscopy;  Laterality: N/A;  . ESOPHAGOGASTRODUODENOSCOPY N/A 06/26/2017   Procedure: ESOPHAGOGASTRODUODENOSCOPY (EGD);  Surgeon: Manya Silvas, MD;  Location: [facility] ENDOSCOPY;  Service: Endoscopy;  Laterality: N/A;  . ESOPHAGOGASTRODUODENOSCOPY N/A 06/27/2017   Procedure: ESOPHAGOGASTRODUODENOSCOPY (EGD);  Surgeon: Manya Silvas, MD;  Location: [facility] ENDOSCOPY;  Service: Endoscopy;  Laterality: N/A;  . ESOPHAGOGASTRODUODENOSCOPY N/A 06/28/2017   Procedure: ESOPHAGOGASTRODUODENOSCOPY (EGD);  Surgeon: Manya Silvas, MD;  Location: [facility] ENDOSCOPY;  Service: Endoscopy;  Laterality: N/A;  . ESOPHAGOGASTRODUODENOSCOPY Bilateral 10/11/2017   Procedure: ESOPHAGOGASTRODUODENOSCOPY (EGD);  Surgeon: Manya Silvas, MD;  Location: [facility] ENDOSCOPY;  Service: Endoscopy;  Laterality:  Bilateral;  . ESOPHAGOGASTRODUODENOSCOPY N/A 12/04/2017   Procedure: ESOPHAGOGASTRODUODENOSCOPY (EGD);  Surgeon: Manya Silvas, MD;  Location: [facility] ENDOSCOPY;  Service: Endoscopy;  Laterality: N/A;  . ESOPHAGOGASTRODUODENOSCOPY N/A 03/06/2019   Procedure: ESOPHAGOGASTRODUODENOSCOPY (EGD);  Surgeon: Toledo, Benay Pike, MD;  Location: [facility] ENDOSCOPY;  Service: Gastroenterology;  Laterality: N/A;  . ESOPHAGOGASTRODUODENOSCOPY (EGD) WITH PROPOFOL N/A 07/20/2015   Procedure:  ESOPHAGOGASTRODUODENOSCOPY (EGD) WITH PROPOFOL;  Surgeon: Manya Silvas, MD;  Location: [facility] ENDOSCOPY;  Service: Endoscopy;  Laterality: N/A;  . ESOPHAGOGASTRODUODENOSCOPY (EGD) WITH PROPOFOL N/A 09/16/2015   Procedure: ESOPHAGOGASTRODUODENOSCOPY (EGD) WITH PROPOFOL;  Surgeon: Manya Silvas, MD;  Location: [facility] ENDOSCOPY;  Service: Endoscopy;  Laterality: N/A;  . ESOPHAGOGASTRODUODENOSCOPY (EGD) WITH PROPOFOL N/A 03/16/2016   Procedure: ESOPHAGOGASTRODUODENOSCOPY (EGD) WITH PROPOFOL;  Surgeon: Manya Silvas, MD;  Location: [facility] ENDOSCOPY;  Service: Endoscopy;  Laterality: N/A;  . ESOPHAGOGASTRODUODENOSCOPY (EGD) WITH PROPOFOL N/A 09/14/2016   Procedure: ESOPHAGOGASTRODUODENOSCOPY (EGD) WITH PROPOFOL;  Surgeon: Manya Silvas, MD;  Location: [facility] ENDOSCOPY;  Service: Endoscopy;  Laterality: N/A;  . ESOPHAGOGASTRODUODENOSCOPY (EGD) WITH PROPOFOL N/A 11/05/2016   Procedure: ESOPHAGOGASTRODUODENOSCOPY (EGD) WITH PROPOFOL;  Surgeon: Manya Silvas, MD;  Location: [facility] ENDOSCOPY;  Service: Endoscopy;  Laterality: N/A;  . ESOPHAGOGASTRODUODENOSCOPY (EGD) WITH PROPOFOL N/A 02/06/2017   Procedure: ESOPHAGOGASTRODUODENOSCOPY (EGD) WITH PROPOFOL;  Surgeon: Manya Silvas, MD;  Location: [facility] ENDOSCOPY;  Service: Endoscopy;  Laterality: N/A;  . ESOPHAGOGASTRODUODENOSCOPY (EGD) WITH PROPOFOL N/A 04/24/2017   Procedure: ESOPHAGOGASTRODUODENOSCOPY (EGD) WITH PROPOFOL;  Surgeon: Manya Silvas, MD;  Location: [facility] ENDOSCOPY;  Service: Endoscopy;  Laterality: N/A;  . ESOPHAGOGASTRODUODENOSCOPY (EGD) WITH PROPOFOL N/A 07/31/2017   Procedure: ESOPHAGOGASTRODUODENOSCOPY (EGD) WITH PROPOFOL;  Surgeon: Manya Silvas, MD;  Location: [facility] ENDOSCOPY;  Service: Endoscopy;  Laterality: N/A;  . ESOPHAGOGASTRODUODENOSCOPY (EGD) WITH PROPOFOL N/A 08/08/2018   Procedure: ESOPHAGOGASTRODUODENOSCOPY (EGD) WITH PROPOFOL;  Surgeon: Manya Silvas, MD;  Location: [facility] ENDOSCOPY;  Service: Endoscopy;  Laterality:  N/A;  . ESOPHAGOGASTRODUODENOSCOPY (EGD) WITH PROPOFOL N/A 05/21/2020   Procedure: ESOPHAGOGASTRODUODENOSCOPY (EGD) WITH PROPOFOL;  Surgeon: [name] Manifold, MD;  Location: [facility] ENDOSCOPY;  Service: Endoscopy;  Laterality: N/A;  . ESOPHAGOGASTRODUODENOSCOPY (EGD) WITH PROPOFOL N/A 07/18/2020   Procedure: ESOPHAGOGASTRODUODENOSCOPY (EGD) WITH PROPOFOL;  Surgeon: Toledo, Benay Pike, MD;  Location: [facility] ENDOSCOPY;  Service: Gastroenterology;  Laterality: N/A;  . GIVENS CAPSULE STUDY N/A 12/05/2017   Procedure: GIVENS CAPSULE STUDY;  Surgeon: Manya Silvas, MD;  Location: [facility] ENDOSCOPY;  Service: Endoscopy;  Laterality: N/A;  . TONSILLECTOMY    . TONSILLECTOMY AND ADENOIDECTOMY    . ULNAR NERVE TRANSPOSITION    . UVULOPALATOPHARYNGOPLASTY      Home Medications:  Medications Prior to Admission  Medication Sig Dispense Refill Last Dose  . atorvastatin (LIPITOR) 20 MG tablet Take 20 mg by mouth daily.   07/24/2020 at 1800  . ferrous sulfate 325 (65 FE) MG tablet Take 1 tablet (325 mg total) by mouth daily. This medication can cause black stools 60 tablet 1 07/25/2020 at 0600  . hydrALAZINE (APRESOLINE) 50 MG tablet Take 1 tablet (50 mg total) by mouth 2 (two) times daily. 60 tablet 1 07/25/2020 at 0600  . lactulose (CHRONULAC) 10 GM/15ML solution Take 90 mLs (60 g total) by mouth daily. (Patient taking differently: Take 45 g by mouth 2 (two) times daily. ) 236 mL 0 Past Week at Unknown time  . LORazepam (ATIVAN) 0.5 MG tablet Take 1 tablet (0.5 mg total) by mouth 3 (three) times daily as needed for Anxiety  07/25/2020 at 0600  . NOVOLOG MIX 70/30 FLEXPEN (70-30) 100 UNIT/ML FlexPen 0-84 Units as directed. Inject 0 - 84 Units (sliding scale) Subcutaneously twice a day before meals, at breakfast and at dinner.   07/25/2020 at 0600  . pantoprazole (PROTONIX) 40 MG tablet Take 1 tablet (40 mg total) by mouth 2 (two) times daily. 60 tablet 11 07/25/2020 at 0600  . potassium chloride SA (KLOR-CON)  20 MEQ tablet Take 20 mEq by mouth 2 (two) times daily.   07/25/2020 at 0600  . rifaximin (XIFAXAN) 550 MG TABS tablet Take 1 tablet (550 mg total) by mouth 2 (two) times daily. 60 tablet 0 07/25/2020 at 0600  . tamsulosin (FLOMAX) 0.4 MG CAPS capsule Take 1 capsule (0.4 mg total) by mouth daily after supper. 30 capsule 1 07/24/2020 at 1800  . atorvastatin (LIPITOR) 10 MG tablet Take 20 mg by mouth every evening.      . Ipratropium-Albuterol (COMBIVENT RESPIMAT) 20-100 MCG/ACT AERS respimat Inhale 1 puff into the lungs every 6 (six) hours. (Patient not taking: Reported on 07/25/2020) 4 g 0 Not Taking at Unknown time  . metolazone (ZAROXOLYN) 5 MG tablet Take 5 mg by mouth daily. .   14+ days  . sodium chloride (OCEAN) 0.65 % SOLN nasal spray Place 1 spray into both nostrils as needed for congestion (nose irritation). 60 mL 0 unknown at prn  . torsemide (DEMADEX) 100 MG tablet Take 1 tablet (100 mg total) by mouth daily. 30 tablet 1 14+ days   Allergies: No Known Allergies  Family History  Problem Relation Age of Onset  . Diabetes Other   . Transient ischemic attack Father   . CAD Father    Social History:  reports that he has quit smoking. His smoking use included cigars and cigarettes. He quit after 20.00 years of use. He has never used smokeless tobacco. He reports that he does not drink alcohol and does not use drugs.  ROS: A complete review of systems was performed.  All systems are negative except for pertinent findings as noted. Review of Systems  All other systems reviewed and are negative.    Physical Exam:  Vital signs in last 24 hours: Temp:  [98.4 F (36.9 C)-98.8 F (37.1 C)] 98.7 F (37.1 C) (10/29 1542) Pulse Rate:  [63-87] 66 (10/29 1542) Resp:  [16-20] 20 (10/29 1542) BP: (130-158)/(50-88) 130/51 (10/29 1542) SpO2:  [97 %-100 %] 97 % (10/29 1542) Weight:  [136.2 kg] 136.2 kg (10/29 0500) General:  Alert and oriented, No acute distress HEENT: Normocephalic,  atraumatic Cardiovascular: Regular rate and rhythm Lungs: Regular rate and effort Abdomen: Soft, nontender, nondistended, no abdominal masses, bladder not distended.   He has some anasarca type findings of the upper thighs and lower abdomen/suprapubic area and scrotum. Back: No CVA tenderness Extremities: + edema Neurologic: Grossly intact GU: Penis buried, scrotum enlarged with edema and also a tense right hydrocele.  Scrotal skin normal.   Laboratory Data:  Results for orders placed or performed during the hospital encounter of 07/25/20 (from the past 24 hour(s))  Urinalysis, Complete w Microscopic     Status: Abnormal   Collection Time: 07/28/20  5:07 PM  Result Value Ref Range   Color, Urine YELLOW (A) YELLOW   APPearance HAZY (A) CLEAR   Specific Gravity, Urine 1.016 1.005 - 1.030   pH 5.0 5.0 - 8.0   Glucose, UA NEGATIVE NEGATIVE mg/dL   Hgb urine dipstick NEGATIVE NEGATIVE   Bilirubin Urine NEGATIVE NEGATIVE  Ketones, ur NEGATIVE NEGATIVE mg/dL   Protein, ur 100 (A) NEGATIVE mg/dL   Nitrite NEGATIVE NEGATIVE   Leukocytes,Ua NEGATIVE NEGATIVE   RBC / HPF 0-5 0 - 5 RBC/hpf   WBC, UA 0-5 0 - 5 WBC/hpf   Bacteria, UA MANY (A) NONE SEEN   Squamous Epithelial / LPF 0-5 0 - 5  Glucose, capillary     Status: Abnormal   Collection Time: 07/28/20  5:35 PM  Result Value Ref Range   Glucose-Capillary 123 (H) 70 - 99 mg/dL  Glucose, capillary     Status: Abnormal   Collection Time: 07/29/20 12:54 AM  Result Value Ref Range   Glucose-Capillary 132 (H) 70 - 99 mg/dL  CBC     Status: Abnormal   Collection Time: 07/29/20  6:06 AM  Result Value Ref Range   WBC 4.7 4.0 - 10.5 K/uL   RBC 2.41 (L) 4.22 - 5.81 MIL/uL   Hemoglobin 7.4 (L) 13.0 - 17.0 g/dL   HCT 22.7 (L) 39 - 52 %   MCV 94.2 80.0 - 100.0 fL   MCH 30.7 26.0 - 34.0 pg   MCHC 32.6 30.0 - 36.0 g/dL   RDW 16.3 (H) 11.5 - 15.5 %   Platelets 84 (L) 150 - 400 K/uL   nRBC 0.0 0.0 - 0.2 %  Comprehensive metabolic panel      Status: Abnormal   Collection Time: 07/29/20  6:06 AM  Result Value Ref Range   Sodium 143 135 - 145 mmol/L   Potassium 3.9 3.5 - 5.1 mmol/L   Chloride 107 98 - 111 mmol/L   CO2 25 22 - 32 mmol/L   Glucose, Bld 120 (H) 70 - 99 mg/dL   BUN 107 (H) 8 - 23 mg/dL   Creatinine, Ser 3.06 (H) 0.61 - 1.24 mg/dL   Calcium 8.9 8.9 - 10.3 mg/dL   Total Protein 6.2 (L) 6.5 - 8.1 g/dL   Albumin 3.0 (L) 3.5 - 5.0 g/dL   AST 24 15 - 41 U/L   ALT 19 0 - 44 U/L   Alkaline Phosphatase 74 38 - 126 U/L   Total Bilirubin 1.4 (H) 0.3 - 1.2 mg/dL   GFR, Estimated 21 (L) >60 mL/min   Anion gap 11 5 - 15  Ammonia     Status: Abnormal   Collection Time: 07/29/20  6:06 AM  Result Value Ref Range   Ammonia 73 (H) 9 - 35 umol/L  Glucose, capillary     Status: Abnormal   Collection Time: 07/29/20  6:24 AM  Result Value Ref Range   Glucose-Capillary 114 (H) 70 - 99 mg/dL  Glucose, capillary     Status: Abnormal   Collection Time: 07/29/20  7:44 AM  Result Value Ref Range   Glucose-Capillary 114 (H) 70 - 99 mg/dL  Glucose, capillary     Status: Abnormal   Collection Time: 07/29/20 12:07 PM  Result Value Ref Range   Glucose-Capillary 129 (H) 70 - 99 mg/dL   Recent Results (from the past 240 hour(s))  Respiratory Panel by RT PCR (Flu A&B, Covid) - Nasopharyngeal Swab     Status: None   Collection Time: 07/25/20  6:22 PM   Specimen: Nasopharyngeal Swab  Result Value Ref Range Status   SARS Coronavirus 2 by RT PCR NEGATIVE NEGATIVE Final    Comment: (NOTE) SARS-CoV-2 target nucleic acids are NOT DETECTED.  The SARS-CoV-2 RNA is generally detectable in upper respiratoy specimens during the acute phase of infection. The lowest  concentration of SARS-CoV-2 viral copies this assay can detect is 131 copies/mL. A negative result does not preclude SARS-Cov-2 infection and should not be used as the sole basis for treatment or other patient management decisions. A negative result may occur with  improper  specimen collection/handling, submission of specimen other than nasopharyngeal swab, presence of viral mutation(s) within the areas targeted by this assay, and inadequate number of viral copies (<131 copies/mL). A negative result must be combined with clinical observations, patient history, and epidemiological information. The expected result is Negative.  Fact Sheet for Patients:  PinkCheek.be  Fact Sheet for Healthcare Providers:  GravelBags.it  This test is no t yet approved or cleared by the [place] FDA and  has been authorized for detection and/or diagnosis of SARS-CoV-2 by FDA under an Emergency Use Authorization (EUA). This EUA will remain  in effect (meaning this test can be used) for the duration of the COVID-19 declaration under Section 564(b)(1) of the Act, 21 U.S.C. section 360bbb-3(b)(1), unless the authorization is terminated or revoked sooner.     Influenza A by PCR NEGATIVE NEGATIVE Final   Influenza B by PCR NEGATIVE NEGATIVE Final    Comment: (NOTE) The Xpert Xpress SARS-CoV-2/FLU/RSV assay is intended as an aid in  the diagnosis of influenza from Nasopharyngeal swab specimens and  should not be used as a sole basis for treatment. Nasal washings and  aspirates are unacceptable for Xpert Xpress SARS-CoV-2/FLU/RSV  testing.  Fact Sheet for Patients: PinkCheek.be  Fact Sheet for Healthcare Providers: GravelBags.it  This test is not yet approved or cleared by the [place] FDA and  has been authorized for detection and/or diagnosis of SARS-CoV-2 by  FDA under an Emergency Use Authorization (EUA). This EUA will remain  in effect (meaning this test can be used) for the duration of the  Covid-19 declaration under Section 564(b)(1) of the Act, 21  U.S.C. section 360bbb-3(b)(1), unless the authorization is  terminated or revoked. 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  He denies any difficulty with voiding.  We talked about attempt and placement of a Foley catheter and he declined.  Agree, and would recommend continued monitoring and reconsultation of GU if he has painful inability to void.  Festus Aloe 07/29/2020, 5:04 PM

## 2020-07-29 NOTE — Progress Notes (Signed)
PT Cancellation Note  Patient Details Name: Corey West MRN: [id number] DOB: 02-Mar-1951   Cancelled Treatment:    Reason Eval/Treat Not Completed: Other (comment).  Pt resting in bed upon PT arrival and reporting being cold; pt also declining physical therapy d/t not feeling well (pt reports not sleeping well last night and had diarrhea).  Attempted to encourage pt to participate in therapy but pt reporting that he did not feel up to it.  Pt covered with blankets to improve warmth and comfort and temperature of room increased per pt request: pt then appearing comfortable in bed.  Will re-attempt PT evaluation at a later date/time.  [name], PT 07/29/20, 9:18 AM

## 2020-07-29 NOTE — Progress Notes (Signed)
[facility]  ROUNDING NOTE   Subjective:  Patient resting in bed,getting his bilateral lower extremity dressing changed by wound care team.Normal respiratory effort, he is on room air today. No acute distress noted.  Objective:  Vital signs in last 24 hours:  Temp:  [97 F (36.1 C)-98.8 F (37.1 C)] 98.4 F (36.9 C) (10/29 0743) Pulse Rate:  [56-87] 63 (10/29 0743) Resp:  [3-22] 16 (10/29 0743) BP: (132-158)/(50-88) 158/58 (10/29 0743) SpO2:  [99 %-100 %] 100 % (10/29 0743) Weight:  [136.2 kg-141.4 kg] 136.2 kg (10/29 0500)  Weight change: 0 kg Filed Weights   07/28/20 0500 07/28/20 1337 07/29/20 0500  Weight: (!) 141.4 kg (!) 141.4 kg (!) 136.2 kg    Intake/Output: I/O last 3 completed shifts: In: 197.1 [I.V.:4; IV Piggyback:193.1] Out: 1400 [Urine:1400]   Intake/Output this shift:  No intake/output data recorded.  Physical Exam: General:  In no acute distress   Head:  Oral mucous membranes moist.  Eyes:  Anicteric  Lungs:   Respirations symmetrical,unlabored, Lungs diminished  Heart:  Regular rate and rhythm  Abdomen:   obese, distended  Extremities:  Bilateral lower extremities with 2+ Edema, erythematous  Neurologic:  Oriented x 3   Skin:  No new lesions or rashes noted       Basic Metabolic Panel: Recent Labs  Lab 07/25/20 1344 07/25/20 1344 07/26/20 0040 07/26/20 1143 07/26/20 1143 07/27/20 0752 07/28/20 0435 07/29/20 0606  NA 137  --   --  135  --  136 139 143  K 3.2*  --   --  3.1*  --  3.8 4.0 3.9  CL 101  --   --  100  --  102 104 107  CO2 24  --   --  24  --  24 24 25   GLUCOSE 157*  --   --  225*  --  212* 174* 120*  BUN 113*  --   --  105*  --  106* 100* 107*  CREATININE 3.39*  --   --  3.28*  --  3.61* 3.33* 3.06*  CALCIUM 8.6*   < >  --  8.3*   < > 8.2* 8.5* 8.9  MG  --   --  2.1 2.2  --   --   --   --   PHOS  --   --   --  5.8*  --   --   --   --    < > = values in this interval not displayed.    Liver Function  Tests: Recent Labs  Lab 07/25/20 1344 07/26/20 1143 07/29/20 0606  AST 35 30 24  ALT 25 23 19   ALKPHOS 80 69 74  BILITOT 0.8 1.0 1.4*  PROT 6.4* 5.9* 6.2*  ALBUMIN 2.7* 2.5* 3.0*   No results for input(s): LIPASE, AMYLASE in the last 168 hours. Recent Labs  Lab 07/29/20 0606  AMMONIA 73*    CBC: Recent Labs  Lab 07/25/20 1344 07/26/20 0040 07/26/20 1143 07/28/20 0435 07/29/20 0606  WBC 5.5  --  5.4 4.0 4.7  NEUTROABS  --   --   --  3.1  --   HGB 6.8* 6.6* 7.4* 7.3* 7.4*  HCT 20.7* 20.8* 22.4* 22.7* 22.7*  MCV 92.8  --  91.1 93.8 94.2  PLT 111*  --  97* 87* 84*    Cardiac Enzymes: No results for input(s): CKTOTAL, CKMB, CKMBINDEX, TROPONINI in the last 168 hours.  BNP: Invalid input(s): POCBNP  CBG: Recent Labs  Lab 07/28/20 1341 07/28/20 1735 07/29/20 0054 07/29/20 0624 07/29/20 0744  GLUCAP 138* 123* 132* 114* 114*    Microbiology: Results for orders placed or performed during the hospital encounter of 07/25/20  Respiratory Panel by RT PCR (Flu A&B, Covid) - Nasopharyngeal Swab     Status: None   Collection Time: 07/25/20  6:22 PM   Specimen: Nasopharyngeal Swab  Result Value Ref Range Status   SARS Coronavirus 2 by RT PCR NEGATIVE NEGATIVE Final    Comment: (NOTE) SARS-CoV-2 target nucleic acids are NOT DETECTED.  The SARS-CoV-2 RNA is generally detectable in upper respiratoy specimens during the acute phase of infection. The lowest concentration of SARS-CoV-2 viral copies this assay can detect is 131 copies/mL. A negative result does not preclude SARS-Cov-2 infection and should not be used as the sole basis for treatment or other patient management decisions. A negative result may occur with  improper specimen collection/handling, submission of specimen other than nasopharyngeal swab, presence of viral mutation(s) within the areas targeted by this assay, and inadequate number of viral copies (<131 copies/mL). A negative result must be  combined with clinical observations, patient history, and epidemiological information. The expected result is Negative.  Fact Sheet for Patients:  PinkCheek.be  Fact Sheet for Healthcare Providers:  GravelBags.it  This test is no t yet approved or cleared by the [place] FDA and  has been authorized for detection and/or diagnosis of SARS-CoV-2 by FDA under an Emergency Use Authorization (EUA). This EUA will remain  in effect (meaning this test can be used) for the duration of the COVID-19 declaration under Section 564(b)(1) of the Act, 21 U.S.C. section 360bbb-3(b)(1), unless the authorization is terminated or revoked sooner.     Influenza A by PCR NEGATIVE NEGATIVE Final   Influenza B by PCR NEGATIVE NEGATIVE Final    Comment: (NOTE) The Xpert Xpress SARS-CoV-2/FLU/RSV assay is intended as an aid in  the diagnosis of influenza from Nasopharyngeal swab specimens and  should not be used as a sole basis for treatment. Nasal washings and  aspirates are unacceptable for Xpert Xpress SARS-CoV-2/FLU/RSV  testing.  Fact Sheet for Patients: PinkCheek.be  Fact Sheet for Healthcare Providers: GravelBags.it  This test is not yet approved or cleared by the [place] FDA and  has been authorized for detection and/or diagnosis of SARS-CoV-2 by  FDA under an Emergency Use Authorization (EUA). This EUA will remain  in effect (meaning this test can be used) for the duration of the  Covid-19 declaration under Section 564(b)(1) of the Act, 21  U.S.C. section 360bbb-3(b)(1), unless the authorization is  terminated or revoked. Performed at [facility], [address] [place], [place] [address]     Coagulation Studies: No results for input(s): LABPROT, INR in the last 72 hours.  Urinalysis: Recent Labs    07/28/20 1707  COLORURINE YELLOW*  LABSPEC  1.016  PHURINE 5.0  GLUCOSEU NEGATIVE  HGBUR NEGATIVE  BILIRUBINUR NEGATIVE  KETONESUR NEGATIVE  PROTEINUR 100*  NITRITE NEGATIVE  LEUKOCYTESUR NEGATIVE      Imaging: No results found.   Medications:    . insulin aspart  0-9 Units Subcutaneous TID WC  . insulin detemir  5 Units Subcutaneous BID  . lactulose  30 g Oral BID  . pantoprazole (PROTONIX) IV  40 mg Intravenous Q12H  . rifaximin  550 mg Oral BID   LORazepam, ondansetron (ZOFRAN) IV  Assessment/ Plan:  69 y.o. male with past medical history of cirrhosis,  esophageal varices, iron deficiency anemia, chronic venous stasis of both lower extremities, morbid obesity, chronic kidney disease stage IV, COPD, anemia of chronic kidney disease, secondary hyperparathyroidism who presented with GI bleed and acute kidney injury.  1.  Acute kidney injury/chronic kidney disease stage IV  Baseline creatinine is 2.4 Lab Results  Component Value Date   CREATININE 3.06 (H) 07/29/2020   CREATININE 3.33 (H) 07/28/2020   CREATININE 3.61 (H) 07/27/2020   UOP for the preceding 24 hours is 1400 ml We will continue monitoring renal function Dr.Lateef discussed with the patient about challenging medical management with his multiple medical conditions. Patient expressed his understanding,but tearful when talking about it  2.  Anemia of chronic kidney disease/GI bleed.  Patient hemoglobin stays low, but stable around 7.3 to 7.4 level No active bleeding reported  3. Diabetes type 2 with CKD  Blood sugar readings within acceptable range He is on short acting and long acting Insulins.     LOS: 3 Treson Laura 10/29/202112:08 PM

## 2020-07-29 NOTE — Progress Notes (Addendum)
Pt voided 100cc urine and post void scan shows >577ml remaining in bladder. Order for foley cath placed per standing order of post void residual >300.   1130: Attempted foley insertion, but unable to advance catheter due to swelling. MD present in room and verbally states she will consult urology to place catheter.   1530: Per urology, bladder scan is likely not accurate due to ascites in abd. Recommend no catheter since pt is not in discomfort and able to void, even if in small amts.

## 2020-07-29 NOTE — Care Plan (Signed)
Briefly saw patient today. Hemoglobin stable. No plan for further interventions. MELD-Na is 22 with 10% risk of dying within 3 months per plan of care note from yesterday:  - Recommend PO iron therapy - if no contraindication can try low dose beta blocker with coreg 3.125 BID to help try reduce portal pressures and therefore reduce portal hypertensive gastropathy - titrate lactulose for 2-3 bowel movements daily - please check daily CMP's and INR's - Consider palliative care consult  Please contact on-call GI doctor if any questions. [facility] is covering this weekend.  Raylene Miyamoto MD, MPH [place]

## 2020-07-29 NOTE — Progress Notes (Signed)
Pt able to void 100 cc of urine for clean catch. Post void bladder scan shows >500 ml urine remaining. MD notified via secure chat. Urine collected and sent to lab at this time.

## 2020-07-29 NOTE — Consult Note (Signed)
[facility] Nurse wound follow up Wound type: venous stasis ulceration x 2  Measurement: see previous notes Wound bed:100% ruddy, clean, moist Drainage (amount, consistency, odor) moderate; yellow Periwound:intact; redness improved  Dressing procedure/placement/frequency: Removed Unna's boots bilaterally  NTs in to give patient a bath Applied calcium alginate to the open venous ulcerations on the LLE.  Applied Unna's boots bilaterally Patient tolerated well.   [place], [place], [place]

## 2020-07-29 NOTE — Progress Notes (Signed)
PROGRESS NOTE    Corey West  BJY:[id number] DOB: 1950/10/16 DOA: 07/25/2020 PCP: Leonel Ramsay, MD    Chief Complaint  Patient presents with  . Abnormal Lab  . 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  He does have baseline shortness of breath without recent worsening.  Patient follows withDr. Lissa Hoard his outpatient nephrologist.   Patient was recently taken off his Lasix due to trending up creatinine.  Patient has a history of upper GI bleed, EGD performed byDr. Lysle Pearl 07/18/2020 notable for gastric antral vascular ectasias/plaser therapy,without overt source of active bleed  In the ED, he was mildly tachypneic.  Hemoglobin was 6.8 with MCV of 93.  Platelet was 111.  COVID-19 and flu was negative.  Chest x-ray showed stable cardiomegaly  Subjective:  He is alert this am, he is slightly confused about the year, he reports baseline memory deficit  He denies pain   Assessment & Plan:   Principal Problem:   Acute on chronic anemia Active Problems:   Cirrhosis ([place])   Acute kidney injury superimposed on CKD ([place])   Type 2 diabetes mellitus with diabetic nephropathy, with long-term current use of insulin (HCC)   Chronic diastolic CHF (congestive heart failure) (HCC)   Generalized weakness   GI bleed  Acute on chronic anemia: Patient does have history of GI bleed in the past.  Likely subacute/chronic  GI bleed with underlying chronic liver disease.  History of multiple EGD and capsule endoscopy.  Last endoscopy on 07/18/2020.  Not on any blood thinners.   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 - Normal examined duodenum." -GI recommended coreg 3.125 mg BID for portaly hypertensive gastropathy, and oral iron supplement -per GI "No plan for further interventions. MELD-Na is 22 with 10% risk of dying within 3 months "GI  recommended palliative care consult, order placed  Acute kidney injury on stage IV chronic kidney disease: baseline creatinine of 2.7-2.9, presenting creatinine noted to be 3.39 Creatinine appear repeat at 3.6 and BUN of here peak at 106 BUN 100 creatinine 3.3 today UA was ordered but never collected, reordered UA on October 29 showed bacteria in the urine, urine culture in process Nephrology following, nephrology agree with palliative care consult  Urinary retention?  Post void residual> 500cc He denies discomfort, per urology bladder ultrasound may not be accurate in the setting of ascites Continue monitor, urology input appreciated  Cirrhosis of liver with history of portal hypertension esophageal varices, hepatic encephalopathy and chronic thrombocytopenia   Lasix was discontinued recently due to rising creatinine levels.  He is not on spironolactone.  Currently on lactulose and rifaximin.  INR of 1.3.-1.4  ammonia level 73, this morning he is more alert and interactive, continue lactulose at current dose  Chronic diastolic heart failure: Echocardiogram done on July 2021 with LVEF 50 to 55%, no evidence of focal wall motion abnormalities, grade 2 diastolic dysfunction.   Chest x-ray with stable cardiomegaly and mild pulmonary vascular congestion Generalized edema  anasarca, off diuretics due to elevated renal function, continue bilateral Unna boots ( reports getting  it changed twice a week at home, last changed on 10/26) Getting blood product and IV albumin Close monitor volume status  Type 2 diabetes mellitus with diabetic nephropathy. on  NovoLog 70/30 as outpatient.  On Levemir while in the hospital with sliding scale insulin and Accu-Cheks.'   Class III obesity Body mass index is 44.34 kg/m. Will benefit outpatient sleep study  DVT prophylaxis: none due to blood loss anemia, getting bilateral unna boots   Code Status:full Family Communication: patient Disposition:   Status is: Inpatient  Dispo: The patient is from: home              Anticipated d/c is to: TBD              Anticipated d/c date is: TBS              Patient currently not medically ready to discharge  Consultants:   Nephrology  GI  Urology  Palliative care  Procedures:   None  Antimicrobials:   Rifaximin     Objective: Vitals:   07/29/20 0049 07/29/20 0500 07/29/20 0622 07/29/20 0743  BP: (!) 142/88  (!) 153/50 (!) 158/58  Pulse: 87  63 63  Resp: 16  16 16   Temp: 98.5 F (36.9 C)  98.8 F (37.1 C) 98.4 F (36.9 C)  TempSrc: Oral   Oral  SpO2: 100%  100% 100%  Weight:  (!) 136.2 kg    Height:        Intake/Output Summary (Last 24 hours) at 07/29/2020 1055 Last data filed at 07/29/2020 0700 Gross per 24 hour  Intake 104 ml  Output 1400 ml  Net -1296 ml   Filed Weights   07/28/20 0500 07/28/20 1337 07/29/20 0500  Weight: (!) 141.4 kg (!) 141.4 kg (!) 136.2 kg    Examination:  General exam: Chronically ill appearing, generalized anasarca, obese, he is fully alert and interactive today, slightly confused about a year Respiratory system: diminished at basis, no wheezing, no rales, no rhonchi, Respiratory effort normal. Cardiovascular system: S1 & S2 heard, RRR.  Gastrointestinal system: Abdomen is nondistended, soft and nontender. . Normal bowel sounds heard. Central nervous system: Alert and oriented. No focal neurological  deficits. Extremities: generalized weakness Skin: bilateral lower extremity unna boots on  Psychiatry: Judgement and insight appear normal. Mood & affect appropriate.     Data Reviewed: I have personally reviewed following labs and imaging studies  CBC: Recent Labs  Lab 07/25/20 1344 07/26/20 0040 07/26/20 1143 07/28/20 0435 07/29/20 0606  WBC 5.5  --  5.4 4.0 4.7  NEUTROABS  --   --   --  3.1  --   HGB 6.8* 6.6* 7.4* 7.3* 7.4*  HCT 20.7* 20.8* 22.4* 22.7* 22.7*  MCV 92.8  --  91.1 93.8 94.2  PLT 111*  --  97* 87* 84*    Basic Metabolic Panel: Recent Labs  Lab 07/25/20 1344 07/26/20 0040 07/26/20 1143 07/27/20 0752 07/28/20 0435 07/29/20 0606  NA 137  --  135 136 139 143  K 3.2*  --  3.1* 3.8 4.0 3.9  CL 101  --  100 102 104 107  CO2 24  --  24 24 24 25   GLUCOSE 157*  --  225* 212* 174* 120*  BUN 113*  --  105* 106* 100* 107*  CREATININE 3.39*  --  3.28* 3.61* 3.33* 3.06*  CALCIUM 8.6*  --  8.3* 8.2* 8.5* 8.9  MG  --  2.1 2.2  --   --   --   PHOS  --   --  5.8*  --   --   --     GFR: Estimated Creatinine Clearance: 31.7 mL/min (A) (by C-G formula based on SCr of 3.06 mg/dL (H)).  Liver Function Tests: Recent Labs  Lab 07/25/20 1344 07/26/20 1143 07/29/20 0606  AST 35 30 24  ALT 25 23 19   ALKPHOS 80 69 74  BILITOT 0.8 1.0 1.4*  PROT 6.4* 5.9* 6.2*  ALBUMIN 2.7* 2.5* 3.0*    CBG: Recent Labs  Lab 07/28/20 1341 07/28/20 1735 07/29/20 0054 07/29/20 0624 07/29/20 0744  GLUCAP 138* 123* 132* 114* 114*     Recent Results (from the past 240 hour(s))  Respiratory Panel by RT PCR (Flu A&B, Covid) - Nasopharyngeal Swab     Status: None   Collection Time: 07/25/20  6:22 PM   Specimen: Nasopharyngeal Swab  Result Value Ref Range Status   SARS Coronavirus 2 by RT PCR NEGATIVE NEGATIVE Final    Comment: (NOTE) SARS-CoV-2 target nucleic acids are NOT DETECTED.  The SARS-CoV-2 RNA is generally detectable in upper respiratoy specimens during the  acute phase of infection. The lowest concentration of SARS-CoV-2 viral copies this assay can detect is 131 copies/mL. A negative result does not preclude SARS-Cov-2 infection and should not be used as the sole basis for treatment or other patient management decisions. A negative result may occur with  improper specimen collection/handling, submission of specimen other than nasopharyngeal swab, presence of viral mutation(s) within the areas targeted by this assay, and inadequate number of viral copies (<131 copies/mL). A negative result must be combined with clinical observations, patient history, and epidemiological information. The expected result is Negative.  Fact Sheet for Patients:  PinkCheek.be  Fact Sheet for Healthcare Providers:  GravelBags.it  This test is no t yet approved or cleared by the [place] FDA and  has been authorized for detection and/or diagnosis of SARS-CoV-2 by FDA under an Emergency Use Authorization (EUA). This EUA will remain  in effect (meaning this test can be used) for the duration of the COVID-19 declaration under Section 564(b)(1) of the Act, 21 U.S.C. section 360bbb-3(b)(1), unless the authorization is terminated or revoked sooner.     Influenza A by PCR NEGATIVE NEGATIVE Final   Influenza B by PCR NEGATIVE NEGATIVE Final    Comment: (NOTE) The Xpert Xpress SARS-CoV-2/FLU/RSV assay is intended as an aid in  the diagnosis of influenza from Nasopharyngeal swab specimens and  should not be used as a sole basis for treatment. Nasal washings and  aspirates are unacceptable for Xpert Xpress SARS-CoV-2/FLU/RSV  testing.  Fact Sheet for Patients: PinkCheek.be  Fact Sheet for Healthcare Providers: GravelBags.it  This test is not yet approved or cleared by the [place] FDA and  has been authorized for detection and/or diagnosis  of SARS-CoV-2 by  FDA under an Emergency Use Authorization (EUA). This EUA will remain  in effect (meaning this test can be used) for the duration of the  Covid-19 declaration under Section 564(b)(1) of the Act, 21  U.S.C. section 360bbb-3(b)(1), unless the authorization is  terminated or revoked. Performed at [facility], [address], [place], [place] [address]          Radiology Studies: No results found.      Scheduled Meds: . insulin aspart  0-9 Units Subcutaneous TID WC  . insulin detemir  5 Units Subcutaneous BID  . lactulose  30 g Oral BID  . pantoprazole (PROTONIX) IV  40 mg Intravenous Q12H  . rifaximin  550 mg Oral BID   Continuous Infusions:    LOS: 3 days   Time spent: 41mins Greater than 50% of this time was spent in counseling, explanation of diagnosis, planning of further management, and coordination of care.  I have personally reviewed and interpreted on  07/29/2020 daily labs, I reviewed all nursing notes, pharmacy notes, consultant notes,  vitals, pertinent old records  I have discussed plan of care as described above with RN , patient on 07/29/2020  Voice Recognition /Dragon dictation system was used to create this note, attempts have been made to correct errors. Please contact the author with questions and/or clarifications.   Florencia Reasons, MD PhD FACP [facility]  Available via Epic secure chat 7am-7pm for nonurgent issues Please page for urgent issues To page the attending provider between 7A-7P or the covering provider during after hours 7P-7A, please log into the web site [email or web address] and access using universal [place] password for that web site. If you do not have the password, please call the hospital operator.    07/29/2020, 10:55 AM

## 2020-07-29 NOTE — Plan of Care (Signed)

## 2020-07-30 DIAGNOSIS — D649 Anemia, unspecified: Secondary | ICD-10-CM | POA: Diagnosis not present

## 2020-07-30 LAB — URINE CULTURE

## 2020-07-30 LAB — GLUCOSE, CAPILLARY
Glucose-Capillary: 148 mg/dL — ABNORMAL HIGH (ref 70–99)
Glucose-Capillary: 136 mg/dL — ABNORMAL HIGH (ref 70–99)

## 2020-07-30 MED ORDER — CARVEDILOL 3.125 MG PO TABS
3.1250 mg | ORAL_TABLET | Freq: Two times a day (BID) | ORAL | 0 refills | Status: DC
Start: 2020-07-30 — End: 2020-09-12

## 2020-07-30 NOTE — Progress Notes (Addendum)
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  Clinical Impression  Patient agrees to PT evaluation. Pt reports no pain. Pt lives alone with 3 step entry into his home. Pt ambulated with RW prior to this hospital admission. Pt has -3/5 strength BLE hip and 3/5 knee and is mod A for bed mobility, MI for transfers sit to stand with RW. Pt ambulates with RW 10 feet with MI assist and no reports of pain. Pt has WFL static sitting balance and fair static/dynamic standing balance and needs UE support with RW. Pt will continue to benefit from skilled PT to improve mobility and strength.    Follow Up Recommendations Home health PT    Equipment Recommendations  None recommended by PT    Recommendations for Other Services       Precautions / Restrictions Precautions Precautions: None Restrictions Weight Bearing Restrictions: No      Mobility  Bed Mobility Overal bed mobility: Needs Assistance Bed Mobility: Supine to Sit;Sit to Supine     Supine to sit: Mod assist Sit to supine: Mod assist   General bed mobility comments: uses hospital bed with railings for assist    Transfers Overall transfer level: Modified independent Equipment used: Rolling walker (2 wheeled)             General transfer comment: VC for sequencing  Ambulation/Gait Ambulation/Gait assistance: Modified independent (Device/Increase time) Gait Distance (Feet): 10 Feet Assistive device: Rolling walker (2 wheeled) Gait Pattern/deviations: Wide base of support        Stairs            Wheelchair Mobility    Modified Rankin (Stroke Patients Only)       Balance Overall balance assessment: Modified  Independent                                           Pertinent Vitals/Pain Pain Assessment: 0-10    Home Living Family/patient expects to be discharged to:: Private residence Living Arrangements: Alone Available Help at Discharge: Family;Home health Type of Home: House Home Access: Stairs to enter Entrance Stairs-Rails: Right Entrance Stairs-Number of Steps: 3 Home Layout: One level Home Equipment: Environmental consultant - 2 wheels;Cane - single point      Prior Function Level of Independence: Independent with assistive device(s)               Hand Dominance   Dominant Hand: Right    Extremity/Trunk Assessment   Upper Extremity Assessment Upper Extremity Assessment: Overall WFL for tasks assessed    Lower Extremity Assessment Lower Extremity Assessment: RLE deficits/detail;LLE deficits/detail RLE Deficits / Details: -3/5 hip flex, 3/5 knee ext LLE Deficits / Details: -3/5 hip flex, 3/5 knee ext       Communication   Communication: No difficulties  Cognition Arousal/Alertness: Awake/alert Behavior During Therapy: WFL for tasks assessed/performed Overall Cognitive Status: Within Functional Limits for tasks assessed                                        General Comments  General comments (skin integrity, edema, etc.): below knee ace wrapping bilaterally    Exercises     Assessment/Plan    PT Assessment Patient needs continued PT services  PT Problem List Decreased strength;Decreased activity tolerance;Decreased mobility       PT Treatment Interventions Gait training    PT Goals (Current goals can be found in the Care Plan section)  Acute Rehab PT Goals Patient Stated Goal: to go home PT Goal Formulation: With patient Time For Goal Achievement: 08/13/20 Potential to Achieve Goals: Fair    Frequency Min 2X/week   Barriers to discharge Decreased caregiver support      Co-evaluation               AM-PAC PT "6  Clicks" Mobility  Outcome Measure Help needed turning from your back to your side while in a flat bed without using bedrails?: A Lot Help needed moving from lying on your back to sitting on the side of a flat bed without using bedrails?: A Lot Help needed moving to and from a bed to a chair (including a wheelchair)?: A Lot Help needed standing up from a chair using your arms (e.g., wheelchair or bedside chair)?: A Lot Help needed to walk in hospital room?: A Little Help needed climbing 3-5 steps with a railing? : A Lot 6 Click Score: 13    End of Session Equipment Utilized During Treatment: Gait belt Activity Tolerance: Patient tolerated treatment well Patient left: in bed;with bed alarm set Nurse Communication: Mobility status PT Visit Diagnosis: Muscle weakness (generalized) (M62.81);Difficulty in walking, not elsewhere classified (R26.2)    Time: 1828-8337 PT Time Calculation (min) (ACUTE ONLY): 30 min   Charges:   PT Evaluation $PT Eval Low Complexity: 1 Low PT Treatments $Gait Training: 8-22 mins          Arelia Sneddon S PT DPT 07/30/2020, 9:56 AM

## 2020-07-30 NOTE — Progress Notes (Signed)
[facility]  ROUNDING NOTE   Subjective:   Patient states he is breathing well and states he is not interested in palliative care.   Objective:  Vital signs in last 24 hours:  Temp:  [98 F (36.7 C)-98.7 F (37.1 C)] 98 F (36.7 C) (10/30 0807) Pulse Rate:  [54-59] 54 (10/30 1200) Resp:  [17-18] 18 (10/30 1200) BP: (141-152)/(53-57) 147/57 (10/30 1200) SpO2:  [96 %-98 %] 97 % (10/30 1200)  Weight change:  Filed Weights   07/28/20 0500 07/28/20 1337 07/29/20 0500  Weight: (!) 141.4 kg (!) 141.4 kg (!) 136.2 kg    Intake/Output: I/O last 3 completed shifts: In: 240 [P.O.:240] Out: 1900 [Urine:1900]   Intake/Output this shift:  No intake/output data recorded.  Physical Exam: General:  In no acute distress   Head:  Oral mucous membranes moist.  Eyes:  Anicteric  Lungs:   Respirations symmetrical,unlabored, Lungs diminished  Heart:  Regular rate and rhythm  Abdomen:   obese, distended  Extremities:  Bilateral lower extremities with 2+ Edema, erythematous  Neurologic:  Oriented x 3   Skin:  No new lesions or rashes noted       Basic Metabolic Panel: Recent Labs  Lab 07/25/20 1344 07/25/20 1344 07/26/20 0040 07/26/20 1143 07/26/20 1143 07/27/20 0752 07/28/20 0435 07/29/20 0606  NA 137  --   --  135  --  136 139 143  K 3.2*  --   --  3.1*  --  3.8 4.0 3.9  CL 101  --   --  100  --  102 104 107  CO2 24  --   --  24  --  24 24 25   GLUCOSE 157*  --   --  225*  --  212* 174* 120*  BUN 113*  --   --  105*  --  106* 100* 107*  CREATININE 3.39*  --   --  3.28*  --  3.61* 3.33* 3.06*  CALCIUM 8.6*   < >  --  8.3*   < > 8.2* 8.5* 8.9  MG  --   --  2.1 2.2  --   --   --   --   PHOS  --   --   --  5.8*  --   --   --   --    < > = values in this interval not displayed.    Liver Function Tests: Recent Labs  Lab 07/25/20 1344 07/26/20 1143 07/29/20 0606  AST 35 30 24  ALT 25 23 19   ALKPHOS 80 69 74  BILITOT 0.8 1.0 1.4*  PROT 6.4* 5.9* 6.2*   ALBUMIN 2.7* 2.5* 3.0*   No results for input(s): LIPASE, AMYLASE in the last 168 hours. Recent Labs  Lab 07/29/20 0606  AMMONIA 73*    CBC: Recent Labs  Lab 07/25/20 1344 07/26/20 0040 07/26/20 1143 07/28/20 0435 07/29/20 0606  WBC 5.5  --  5.4 4.0 4.7  NEUTROABS  --   --   --  3.1  --   HGB 6.8* 6.6* 7.4* 7.3* 7.4*  HCT 20.7* 20.8* 22.4* 22.7* 22.7*  MCV 92.8  --  91.1 93.8 94.2  PLT 111*  --  97* 87* 84*    Cardiac Enzymes: No results for input(s): CKTOTAL, CKMB, CKMBINDEX, TROPONINI in the last 168 hours.  BNP: Invalid input(s): POCBNP  CBG: Recent Labs  Lab 07/29/20 1207 07/29/20 1719 07/29/20 1936 07/30/20 0808 07/30/20 1200  GLUCAP 129* 219* 218*  148* 136*    Microbiology: Results for orders placed or performed during the hospital encounter of 07/25/20  Respiratory Panel by RT PCR (Flu A&B, Covid) - Nasopharyngeal Swab     Status: None   Collection Time: 07/25/20  6:22 PM   Specimen: Nasopharyngeal Swab  Result Value Ref Range Status   SARS Coronavirus 2 by RT PCR NEGATIVE NEGATIVE Final    Comment: (NOTE) SARS-CoV-2 target nucleic acids are NOT DETECTED.  The SARS-CoV-2 RNA is generally detectable in upper respiratoy specimens during the acute phase of infection. The lowest concentration of SARS-CoV-2 viral copies this assay can detect is 131 copies/mL. A negative result does not preclude SARS-Cov-2 infection and should not be used as the sole basis for treatment or other patient management decisions. A negative result may occur with  improper specimen collection/handling, submission of specimen other than nasopharyngeal swab, presence of viral mutation(s) within the areas targeted by this assay, and inadequate number of viral copies (<131 copies/mL). A negative result must be combined with clinical observations, patient history, and epidemiological information. The expected result is Negative.  Fact Sheet for Patients:   PinkCheek.be  Fact Sheet for Healthcare Providers:  GravelBags.it  This test is no t yet approved or cleared by the [place] FDA and  has been authorized for detection and/or diagnosis of SARS-CoV-2 by FDA under an Emergency Use Authorization (EUA). This EUA will remain  in effect (meaning this test can be used) for the duration of the COVID-19 declaration under Section 564(b)(1) of the Act, 21 U.S.C. section 360bbb-3(b)(1), unless the authorization is terminated or revoked sooner.     Influenza A by PCR NEGATIVE NEGATIVE Final   Influenza B by PCR NEGATIVE NEGATIVE Final    Comment: (NOTE) The Xpert Xpress SARS-CoV-2/FLU/RSV assay is intended as an aid in  the diagnosis of influenza from Nasopharyngeal swab specimens and  should not be used as a sole basis for treatment. Nasal washings and  aspirates are unacceptable for Xpert Xpress SARS-CoV-2/FLU/RSV  testing.  Fact Sheet for Patients: PinkCheek.be  Fact Sheet for Healthcare Providers: GravelBags.it  This test is not yet approved or cleared by the [place] FDA and  has been authorized for detection and/or diagnosis of SARS-CoV-2 by  FDA under an Emergency Use Authorization (EUA). This EUA will remain  in effect (meaning this test can be used) for the duration of the  Covid-19 declaration under Section 564(b)(1) of the Act, 21  U.S.C. section 360bbb-3(b)(1), unless the authorization is  terminated or revoked. Performed at [facility], [address] [place], [place] [address]   Urine Culture     Status: Abnormal   Collection Time: 07/29/20 10:46 AM   Specimen: Urine, Random  Result Value Ref Range Status   Specimen Description   Final    URINE, RANDOM Performed at [facility], [address] [place], [place] [address]    Special Requests   Final    NONE Performed at  [facility], [address] [place], [place] [address]    Culture MULTIPLE SPECIES PRESENT, SUGGEST RECOLLECTION (A)  Final   Report Status 07/30/2020 FINAL  Final    Coagulation Studies: No results for input(s): LABPROT, INR in the last 72 hours.  Urinalysis: Recent Labs    07/28/20 1707  COLORURINE YELLOW*  LABSPEC 1.016  PHURINE 5.0  GLUCOSEU NEGATIVE  HGBUR NEGATIVE  BILIRUBINUR NEGATIVE  KETONESUR NEGATIVE  PROTEINUR 100*  NITRITE NEGATIVE  LEUKOCYTESUR NEGATIVE  Imaging: No results found.   Medications:    . carvedilol  3.125 mg Oral BID WC  . insulin aspart  0-9 Units Subcutaneous TID WC  . insulin detemir  5 Units Subcutaneous BID  . lactulose  30 g Oral BID  . pantoprazole (PROTONIX) IV  40 mg Intravenous Q12H  . rifaximin  550 mg Oral BID   LORazepam, ondansetron (ZOFRAN) IV  Assessment/ Plan:  70 y.o.white male with past medical history of cirrhosis, esophageal varices, iron deficiency anemia, chronic venous stasis of both lower extremities, morbid obesity, chronic kidney disease stage IV, COPD, anemia of chronic kidney disease, secondary hyperparathyroidism who presented with GI bleed and acute kidney injury.  1.  Acute kidney injury/chronic kidney disease stage IV  Baseline creatinine is 2.4 Lab Results  Component Value Date   CREATININE 3.06 (H) 07/29/2020   CREATININE 3.33 (H) 07/28/2020   CREATININE 3.61 (H) 07/27/2020    2.  Anemia of chronic kidney disease/GI bleed.    3. Diabetes type 2 with CKD    Overall prognosis is poor. Relayed this to the patient. He states he is optimistic about his outcomes.      LOS: [place] 10/30/20214:37 PM

## 2020-07-30 NOTE — Discharge Summary (Signed)
Discharge Summary  BERNERD TERHUNE EQA:[id number] DOB: October 21, 1950  PCP: Leonel Ramsay, MD  Admit date: 07/25/2020 Discharge date: 07/30/2020  Time spent: 70mins, more than 50% time spent on coordination of care.   Palliative care consult recommended however patient does not want to stay in hospital to talk to palliative care, community palliative care referral made Patient decides high risk of readmission, recommend consult palliative care on admission  if patient back to the hospital again, both patient and family agrees  Recommendations for Outpatient Follow-up:  1. F/u with PCP within a week  for hospital discharge follow up, repeat cbc/bmp at follow up. 2. Follow-up with GI, patient stated that he is going to [place] for liver transplant evaluation 3. Follow-up with nephrology 4. Outpatient palliative care referral 5. Home health arranged  Discharge Diagnoses:  Active Hospital Problems   Diagnosis Date Noted  . Acute on chronic anemia 07/25/2020  . GI bleed 07/26/2020  . Generalized weakness 07/25/2020  . Chronic diastolic CHF (congestive heart failure) ([place]) 11/27/2019  . Acute kidney injury superimposed on CKD ([place]) 01/02/2018  . Type 2 diabetes mellitus with diabetic nephropathy, with long-term current use of insulin ([place]) 05/29/2017  . Cirrhosis ([place]) 02/08/2015    Resolved Hospital Problems  No resolved problems to display.    Discharge Condition: stable  Diet recommendation: heart healthy/carb modified  Filed Weights   07/28/20 0500 07/28/20 1337 07/29/20 0500  Weight: (!) 141.4 kg (!) 141.4 kg (!) 136.2 kg    History of present illness: (Per admitting MD Dr. Velia Meyer) Chief Complaint: Generalized weakness  HPI: Corey West is a 69 y.o. 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, hepatic encephalopathy, esophageal varices, chronic diastolic heart failure, chronic venous stasis in bilateral lower  extremities, type 2 diabetes mellitus complicated by diabetic nephropathy, hypertension, gastric antral vascular ectasias, chronic multifactorial anemia associated with baseline hemoglobin of 7-8, generalized anxiety disorder, BPH, who is admitted to [facility] on 07/25/2020 with acute on chronic anemia after presenting from home to [facility] Emergency Department complaining of generalized weakness.   The patient reports 4 to 5 days of generalized weakness in the absence of any acute focal weakness or change in sensation.  Denies any associated subjective fever, chills, rigors, or generalized myalgias.  Denies any recent headaches, neck stiffness, rhinitis, rhinorrhea, sore throat, or cough. Denies any recent dysuria, gross hematuria, or change in urinary urgency/frequency.   In the context of a history of cirrhosis as well as chronic diastolic heart failure, the patient reports baseline shortness of breath, without any significant recent worsening.  He notes chronic edema in the bilateral lower extremities, that he feels may possibly be slightly worse over the last few days, but has not noticed any significant change over that timeframe.  Denies any recent chest pain, palpitations, diaphoresis, or palpitations.   In the setting of stage IV chronic kidney disease with baseline creatinine 2.7-2.9, the patient follows with Dr. Candiss Norse as his outpatient nephrologist.  The patient reports that while he is continue to take his metolazone and torsemide, that he was recently taken off of Lasix due to a recent trend up in his serum creatinine.   He also has a history of upper GI bleed, with most recent EGD performed by Dr. 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  Subsequent to this EGD, the patient denies  any recent abdominal  pain, nausea, vomiting, hematemesis, melena, hematochezia, or diarrhea.  In  the setting of chronic multifactorial anemia the patient has historically undergone intermittent blood transfusions, and he reports that the generalized weakness with which she presents this evening is similar to that which she has experienced at times that he has previously required blood 1 unit PRBC.  Not on any blood thinners as an outpatient.  Over the last 3 to 4 days he also reports mild lightheadedness and dizziness in the absence of any presyncope or syncope.  No recent trauma.  Denies any alcohol consumption over the last 15 years.  Hospital Course:  Principal Problem:   Acute on chronic anemia Active Problems:   Cirrhosis ([place])   Acute kidney injury superimposed on CKD (H[facility])   Type 2 diabetes mellitus with diabetic nephropathy, with long-term current use of insulin (H[facility])   Chronic diastolic CHF (congestive heart failure) (H[facility])   Generalized weakness   GI bleed   Acute on chronic anemia:Patient does have history of GI bleed in the past.Likely subacute/chronic GI bleed with underlying chronic liver disease.History of multiple EGD and capsule endoscopy. Last endoscopy on 07/18/2020. Not on any blood thinners. 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 - Normal examined duodenum." -GI recommended coreg 3.125 mg BID for portaly hypertensive gastropathy, and oral iron supplement -per GI "No plan for further interventions. MELD-Na is 22 with 10% risk of dying within 3 months "GI  recommended palliative care consult, order placed -Patient state he is going to [facility] GI evaluation for liver transplant next week, however per patient's niece  this was done before he was determined not be a candidate for liver transplant due to compliance issues.  Acute kidney injury on stage IV chronic kidney disease: baseline creatinine of 2.7-2.9, presenting creatinine noted to be  3.39 Creatinine appears peaked at 3.6 and BUN  peak at 106 Appear improving UA was ordered but never collected, reordered UA on October 29 showed bacteria in the urine, urine culture with multiple species, he denies urinary symptoms Nephrology following, nephrology agree with palliative care consult   Cirrhosisof liver with history of portal hypertension esophageal varices, hepatic encephalopathy and chronic thrombocytopenia  Lasix was discontinued recently due to rising creatinine levels. resumed at discharge now cr has improved and nephrology oked to resume it He is not on spironolactone.  Currently on lactulose and rifaximin. INR of 1.3.-1.4  ammonia level 73, this morning he is more alert and interactive, continue lactulose at current dose  Chronic diastolic heart failure: Echocardiogram done on July 2021 withLVEF 50 to 55%, no evidence of focal wall motion abnormalities, grade 2 diastolic dysfunction. Chest x-ray with stable cardiomegaly and mild pulmonary vascular congestion Generalized edema anasarca, off diuretics due to elevated renal function, continue bilateral Unna boots ( reports getting it changed twice a week at home, last changed on 10/26) received blood product and IV albumin in the hospital Nephrology oked to resume diuretics  Type 2 diabetes mellituswithdiabetic nephropathy. onNovoLog 70/30 as outpatient.   Class III obesity Body mass index is 44.34 kg/m. Will benefit outpatient sleep study, patient reports has sleep study was told normal, I am not sure if it is reliable  Goals of care discussion: I explained to the patient that he had multiple medical issues including  Heart failure, liver failure and renal failure , per GI "MELD-Na is 22 with 10% risk of dying within 3 months" , overall prognosis  is very poor, he initially agreed to talk to palliative care while in-house, today he insisted on being discharged not waiting to talk to palliative care, he  does agree to talk to palliative care outpatient.  Case manager informed to set up referral. I also updated niece over the phone with patient's permission, both patient and family understand that patient is at risk of readmission, if he end up back in the hospital again will consult palliative  on admission .   DVT prophylaxis: none due to blood loss anemia, getting bilateral unna boots   Code Status:full Family Communication:  Niece updated over the phone with his permission Disposition:   Status is: Inpatient  Dispo: The patient is from: home  Anticipated d/c is to: home with home health   Consultants:   Nephrology  GI  Urology   Procedures:   None  Antimicrobials:   Rifaximin   Discharge Exam: BP (!) 147/57 (BP Location: Right Arm)   Pulse (!) 54   Temp 98 F (36.7 C) (Oral)   Resp 18   Ht 5\' 9"  (1.753 m)   Wt (!) 136.2 kg   SpO2 97%   BMI 44.34 kg/m   General: aaox3, NAD, generalized anasarca Cardiovascular: RRR Respiratory: Normal respiratory effort  Discharge Instructions You were cared for by a hospitalist during your hospital stay. If you have any questions about your discharge medications or the care you received while you were in the hospital after you are discharged, you can call the unit and asked to speak with the hospitalist on call if the hospitalist that took care of you is not available. Once you are discharged, your primary care physician will handle any further medical issues. Please note that NO REFILLS for any discharge medications will be authorized once you are discharged, as it is imperative that you return to your primary care physician (or establish a relationship with a primary care physician if you do not have one) for your aftercare needs so that they can reassess your need for medications and monitor your lab values.  Discharge Instructions    Diet - low sodium heart healthy   Complete by: As  directed    Carb modified diet   Increase activity slowly   Complete by: As directed      Allergies as of 07/30/2020   No Known Allergies     Medication List    STOP taking these medications   Combivent Respimat 20-100 MCG/ACT Aers respimat Generic drug: Ipratropium-Albuterol   hydrALAZINE 50 MG tablet Commonly known as: APRESOLINE     TAKE these medications   atorvastatin 10 MG tablet Commonly known as: LIPITOR Take 20 mg by mouth every evening. What changed: Another medication with the same name was removed. Continue taking this medication, and follow the directions you see here.   carvedilol 3.125 MG tablet Commonly known as: COREG Take 1 tablet (3.125 mg total) by mouth 2 (two) times daily with a meal.   ferrous sulfate 325 (65 FE) MG tablet Take 1 tablet (325 mg total) by mouth daily. This medication can cause black stools   lactulose 10 GM/15ML solution Commonly known as: CHRONULAC Take 90 mLs (60 g total) by mouth daily. What changed:   how much to take  when to take this   LORazepam 0.5 MG tablet Commonly known as: ATIVAN Take 1 tablet (0.5 mg total) by mouth 3 (three) times daily as needed for Anxiety   metolazone 5 MG tablet Commonly known as:  ZAROXOLYN Take 5 mg by mouth daily. .   NovoLOG Mix 70/30 FlexPen (70-30) 100 UNIT/ML FlexPen Generic drug: insulin aspart protamine - aspart 0-84 Units as directed. Inject 0 - 84 Units (sliding scale) Subcutaneously twice a day before meals, at breakfast and at dinner.   pantoprazole 40 MG tablet Commonly known as: Protonix Take 1 tablet (40 mg total) by mouth 2 (two) times daily.   potassium chloride SA 20 MEQ tablet Commonly known as: KLOR-CON Take 20 mEq by mouth 2 (two) times daily.   rifaximin 550 MG Tabs tablet Commonly known as: XIFAXAN Take 1 tablet (550 mg total) by mouth 2 (two) times daily.   sodium chloride 0.65 % Soln nasal spray Commonly known as: OCEAN Place 1 spray into both  nostrils as needed for congestion (nose irritation).   tamsulosin 0.4 MG Caps capsule Commonly known as: FLOMAX Take 1 capsule (0.4 mg total) by mouth daily after supper.   torsemide 100 MG tablet Commonly known as: DEMADEX Take 1 tablet (100 mg total) by mouth daily.      No Known Allergies  Follow-up Information    Leonel Ramsay, MD .   Specialty: Infectious Diseases Contact information: [place] [address] [phone]        Nelva Bush, MD .   Specialty: Cardiology Contact information: [place] [address] [phone]        follow up with your primary care doctor Follow up in 1 week(s).   Why: for hospital discharge follow up, repeat cbc/bmp at follow up       follow up with GI Follow up in 1 week(s).   Why: for cirrhosis management               The results of significant diagnostics from this hospitalization (including imaging, microbiology, ancillary and laboratory) are listed below for reference.    Significant Diagnostic Studies: DG Chest 2 View  Result Date: 07/25/2020 CLINICAL DATA:  Shortness of breath EXAM: CHEST - 2 VIEW COMPARISON:  07/07/2020 FINDINGS: Low lung volumes. Pulmonary vascular congestion. No pleural effusion or pneumothorax. Cardiomegaly. No acute osseous abnormality. IMPRESSION: Cardiomegaly and pulmonary vascular congestion similar to prior study. Electronically Signed   By: Macy Mis M.D.   On: 07/25/2020 14:47   CT Head Wo Contrast  Result Date: 07/07/2020 CLINICAL DATA:  Mental status changes. EXAM: CT HEAD WITHOUT CONTRAST TECHNIQUE: Contiguous axial images were obtained from the base of the skull through the vertex without intravenous contrast. COMPARISON:  06/16/2020 FINDINGS: Brain: There is no evidence for acute hemorrhage, hydrocephalus, mass lesion, or abnormal extra-axial fluid collection. No definite CT evidence for acute infarction. Diffuse loss  of parenchymal volume is consistent with atrophy. Vascular: No hyperdense vessel or unexpected calcification. Skull: No evidence for fracture. No worrisome lytic or sclerotic lesion. Sinuses/Orbits: The visualized paranasal sinuses and mastoid air cells are clear. Visualized portions of the globes and intraorbital fat are unremarkable. Other: None. IMPRESSION: 1. Stable.  No acute intracranial abnormality. 2. Atrophy. Electronically Signed   By: Misty Stanley M.D.   On: 07/07/2020 06:27   DG Chest Port 1 View  Result Date: 07/07/2020 CLINICAL DATA:  Altered mental status. EXAM: PORTABLE CHEST 1 VIEW COMPARISON:  06/06/2020. FINDINGS: Mediastinum hilar structures normal. Cardiomegaly with pulmonary venous congestion again noted. Mild bilateral interstitial prominence again noted without interim change. Low lung volumes with bibasilar atelectasis. No prominent pleural effusion. No pneumothorax. IMPRESSION: Cardiomegaly with pulmonary venous  congestion again noted. Mild bilateral interstitial prominence again noted without interim change. Low lung volumes with bibasilar atelectasis. Similar findings noted on prior chest x-ray of 06/06/2020. Electronically Signed   By: Marcello Moores  Register   On: 07/07/2020 06:08    Microbiology: Recent Results (from the past 240 hour(s))  Respiratory Panel by RT PCR (Flu A&B, Covid) - Nasopharyngeal Swab     Status: None   Collection Time: 07/25/20  6:22 PM   Specimen: Nasopharyngeal Swab  Result Value Ref Range Status   SARS Coronavirus 2 by RT PCR NEGATIVE NEGATIVE Final    Comment: (NOTE) SARS-CoV-2 target nucleic acids are NOT DETECTED.  The SARS-CoV-2 RNA is generally detectable in upper respiratoy specimens during the acute phase of infection. The lowest concentration of SARS-CoV-2 viral copies this assay can detect is 131 copies/mL. A negative result does not preclude SARS-Cov-2 infection and should not be used as the sole basis for treatment or other patient  management decisions. A negative result may occur with  improper specimen collection/handling, submission of specimen other than nasopharyngeal swab, presence of viral mutation(s) within the areas targeted by this assay, and inadequate number of viral copies (<131 copies/mL). A negative result must be combined with clinical observations, patient history, and epidemiological information. The expected result is Negative.  Fact Sheet for Patients:  PinkCheek.be  Fact Sheet for Healthcare Providers:  GravelBags.it  This test is no t yet approved or cleared by the [place] FDA and  has been authorized for detection and/or diagnosis of SARS-CoV-2 by FDA under an Emergency Use Authorization (EUA). This EUA will remain  in effect (meaning this test can be used) for the duration of the COVID-19 declaration under Section 564(b)(1) of the Act, 21 U.S.C. section 360bbb-3(b)(1), unless the authorization is terminated or revoked sooner.     Influenza A by PCR NEGATIVE NEGATIVE Final   Influenza B by PCR NEGATIVE NEGATIVE Final    Comment: (NOTE) The Xpert Xpress SARS-CoV-2/FLU/RSV assay is intended as an aid in  the diagnosis of influenza from Nasopharyngeal swab specimens and  should not be used as a sole basis for treatment. Nasal washings and  aspirates are unacceptable for Xpert Xpress SARS-CoV-2/FLU/RSV  testing.  Fact Sheet for Patients: PinkCheek.be  Fact Sheet for Healthcare Providers: GravelBags.it  This test is not yet approved or cleared by the [place] FDA and  has been authorized for detection and/or diagnosis of SARS-CoV-2 by  FDA under an Emergency Use Authorization (EUA). This EUA will remain  in effect (meaning this test can be used) for the duration of the  Covid-19 declaration under Section 564(b)(1) of the Act, 21  U.S.C. section 360bbb-3(b)(1),  unless the authorization is  terminated or revoked. Performed at [facility], [address] [place], [place] [address]   Urine Culture     Status: Abnormal   Collection Time: 07/29/20 10:46 AM   Specimen: Urine, Random  Result Value Ref Range Status   Specimen Description   Final    URINE, RANDOM Performed at [facility], [address], [place], [place] [address]    Special Requests   Final    NONE Performed at [facility], [address] [place], [place] [address]    Culture MULTIPLE SPECIES PRESENT, SUGGEST RECOLLECTION (A)  Final   Report Status 07/30/2020 FINAL  Final     Labs: Basic Metabolic Panel: Recent Labs  Lab 07/25/20 1344 07/26/20 0040 07/26/20 1143 07/27/20 7035 07/28/20 0435 07/29/20 0606  NA 137  --  135 136 139 143  K 3.2*  --  3.1* 3.8 4.0 3.9  CL 101  --  100 102 104 107  CO2 24  --  24 24 24 25   GLUCOSE 157*  --  225* 212* 174* 120*  BUN 113*  --  105* 106* 100* 107*  CREATININE 3.39*  --  3.28* 3.61* 3.33* 3.06*  CALCIUM 8.6*  --  8.3* 8.2* 8.5* 8.9  MG  --  2.1 2.2  --   --   --   PHOS  --   --  5.8*  --   --   --    Liver Function Tests: Recent Labs  Lab 07/25/20 1344 07/26/20 1143 07/29/20 0606  AST 35 30 24  ALT 25 23 19   ALKPHOS 80 69 74  BILITOT 0.8 1.0 1.4*  PROT 6.4* 5.9* 6.2*  ALBUMIN 2.7* 2.5* 3.0*   No results for input(s): LIPASE, AMYLASE in the last 168 hours. Recent Labs  Lab 07/29/20 0606  AMMONIA 73*   CBC: Recent Labs  Lab 07/25/20 1344 07/26/20 0040 07/26/20 1143 07/28/20 0435 07/29/20 0606  WBC 5.5  --  5.4 4.0 4.7  NEUTROABS  --   --   --  3.1  --   HGB 6.8* 6.6* 7.4* 7.3* 7.4*  HCT 20.7* 20.8* 22.4* 22.7* 22.7*  MCV 92.8  --  91.1 93.8 94.2  PLT 111*  --  97* 87* 84*   Cardiac Enzymes: No results for input(s): CKTOTAL, CKMB, CKMBINDEX, TROPONINI in the last 168 hours. BNP: BNP (last 3 results) Recent Labs    06/08/20 0915 06/09/20 0600 07/07/20 0603  BNP  343.3* 397.7* 243.7*    ProBNP (last 3 results) No results for input(s): PROBNP in the last 8760 hours.  CBG: Recent Labs  Lab 07/29/20 1207 07/29/20 1719 07/29/20 1936 07/30/20 0808 07/30/20 1200  GLUCAP 129* 219* 218* 148* 136*       Signed:  Florencia Reasons MD, PhD, FACP  [facility] 07/30/2020, 3:05 PM

## 2020-07-30 NOTE — Plan of Care (Signed)

## 2020-07-30 NOTE — Plan of Care (Signed)
Problem: Education: Goal: Knowledge of General Education information will improve Description: Including pain rating scale, medication(s)/side effects and non-pharmacologic comfort measures 07/30/2020 1445 by Claudia Pollock, RN Outcome: Adequate for Discharge 07/30/2020 1445 by Claudia Pollock, RN Outcome: Adequate for Discharge 07/30/2020 1058 by Claudia Pollock, RN Outcome: Progressing   Problem: Health Behavior/Discharge Planning: Goal: Ability to manage health-related needs will improve 07/30/2020 1445 by Claudia Pollock, RN Outcome: Adequate for Discharge 07/30/2020 1445 by Claudia Pollock, RN Outcome: Adequate for Discharge 07/30/2020 1058 by Claudia Pollock, RN Outcome: Progressing   Problem: Clinical Measurements: Goal: Ability to maintain clinical measurements within normal limits will improve 07/30/2020 1445 by Claudia Pollock, RN Outcome: Adequate for Discharge 07/30/2020 1445 by Claudia Pollock, RN Outcome: Adequate for Discharge 07/30/2020 1058 by Claudia Pollock, RN Outcome: Progressing Goal: Will remain free from infection 07/30/2020 1445 by Claudia Pollock, RN Outcome: Adequate for Discharge 07/30/2020 1445 by Claudia Pollock, RN Outcome: Adequate for Discharge 07/30/2020 1058 by Claudia Pollock, RN Outcome: Progressing Goal: Diagnostic test results will improve 07/30/2020 1445 by Claudia Pollock, RN Outcome: Adequate for Discharge 07/30/2020 1445 by Claudia Pollock, RN Outcome: Adequate for Discharge 07/30/2020 1058 by Claudia Pollock, RN Outcome: Progressing Goal: Respiratory complications will improve 07/30/2020 1445 by Claudia Pollock, RN Outcome: Adequate for Discharge 07/30/2020 1445 by Claudia Pollock, RN Outcome: Adequate for Discharge 07/30/2020 1058 by Claudia Pollock, RN Outcome: Progressing Goal: Cardiovascular complication will be  avoided 07/30/2020 1445 by Claudia Pollock, RN Outcome: Adequate for Discharge 07/30/2020 1445 by Claudia Pollock, RN Outcome: Adequate for Discharge 07/30/2020 1058 by Claudia Pollock, RN Outcome: Progressing   Problem: Activity: Goal: Risk for activity intolerance will decrease 07/30/2020 1445 by Claudia Pollock, RN Outcome: Adequate for Discharge 07/30/2020 1445 by Claudia Pollock, RN Outcome: Adequate for Discharge 07/30/2020 1058 by Claudia Pollock, RN Outcome: Progressing   Problem: Nutrition: Goal: Adequate nutrition will be maintained 07/30/2020 1445 by Claudia Pollock, RN Outcome: Adequate for Discharge 07/30/2020 1445 by Claudia Pollock, RN Outcome: Adequate for Discharge 07/30/2020 1058 by Claudia Pollock, RN Outcome: Progressing   Problem: Coping: Goal: Level of anxiety will decrease 07/30/2020 1445 by Claudia Pollock, RN Outcome: Adequate for Discharge 07/30/2020 1445 by Claudia Pollock, RN Outcome: Adequate for Discharge 07/30/2020 1058 by Claudia Pollock, RN Outcome: Progressing   Problem: Elimination: Goal: Will not experience complications related to bowel motility 07/30/2020 1445 by Claudia Pollock, RN Outcome: Adequate for Discharge 07/30/2020 1445 by Claudia Pollock, RN Outcome: Adequate for Discharge 07/30/2020 1058 by Claudia Pollock, RN Outcome: Progressing Goal: Will not experience complications related to urinary retention 07/30/2020 1445 by Claudia Pollock, RN Outcome: Adequate for Discharge 07/30/2020 1445 by Claudia Pollock, RN Outcome: Adequate for Discharge 07/30/2020 1058 by Claudia Pollock, RN Outcome: Progressing   Problem: Pain Managment: Goal: General experience of comfort will improve 07/30/2020 1445 by Claudia Pollock, RN Outcome: Adequate for Discharge 07/30/2020 1445 by Claudia Pollock, RN Outcome: Adequate for  Discharge 07/30/2020 1058 by Claudia Pollock, RN Outcome: Progressing   Problem: Safety: Goal: Ability to remain free from injury will improve 07/30/2020 1445 by Claudia Pollock, RN Outcome: Adequate for Discharge 07/30/2020 1445 by Claudia Pollock, RN Outcome: Adequate for Discharge 07/30/2020 1058 by Claudia Pollock, RN Outcome: Progressing   Problem: Skin Integrity: Goal: Risk for impaired skin integrity will  decrease 07/30/2020 1445 by Claudia Pollock, RN Outcome: Adequate for Discharge 07/30/2020 1445 by Claudia Pollock, RN Outcome: Adequate for Discharge 07/30/2020 1058 by Claudia Pollock, RN Outcome: Progressing

## 2020-07-30 NOTE — Progress Notes (Signed)
Called to room b/c pt states he will be leaving AMA, does not wish to stay in hosp anymore.  Reasons are that he does not want to go to rehab and wants to go to [facility]. Educated pt about risks of leaving. Communicated pt wishes to MD. MD request for PT eval; PT notified and will see pt this AM.   Encouraged pt to wait for eval and MD to make decision. Pt agreeable but still irritated. Will continue to monitor.

## 2020-08-01 ENCOUNTER — Encounter: Payer: Self-pay | Admitting: Gastroenterology

## 2020-08-01 ENCOUNTER — Inpatient Hospital Stay
Admission: EM | Admit: 2020-08-01 | Discharge: 2020-08-10 | DRG: 441 | Disposition: A | Discharge status: Skilled Nursing Facility | Payer: Medicare Other | Attending: Internal Medicine | Admitting: Internal Medicine

## 2020-08-01 ENCOUNTER — Other Ambulatory Visit: Payer: Self-pay

## 2020-08-01 DIAGNOSIS — Z833 Family history of diabetes mellitus: Secondary | ICD-10-CM

## 2020-08-01 DIAGNOSIS — Z515 Encounter for palliative care: Secondary | ICD-10-CM

## 2020-08-01 DIAGNOSIS — E785 Hyperlipidemia, unspecified: Secondary | ICD-10-CM | POA: Diagnosis present

## 2020-08-01 DIAGNOSIS — K219 Gastro-esophageal reflux disease without esophagitis: Secondary | ICD-10-CM | POA: Diagnosis present

## 2020-08-01 DIAGNOSIS — F411 Generalized anxiety disorder: Secondary | ICD-10-CM | POA: Diagnosis present

## 2020-08-01 DIAGNOSIS — E1122 Type 2 diabetes mellitus with diabetic chronic kidney disease: Secondary | ICD-10-CM | POA: Diagnosis present

## 2020-08-01 DIAGNOSIS — I878 Other specified disorders of veins: Secondary | ICD-10-CM | POA: Diagnosis present

## 2020-08-01 DIAGNOSIS — E876 Hypokalemia: Secondary | ICD-10-CM | POA: Diagnosis not present

## 2020-08-01 DIAGNOSIS — Z6841 Body Mass Index (BMI) 40.0 and over, adult: Secondary | ICD-10-CM

## 2020-08-01 DIAGNOSIS — R188 Other ascites: Secondary | ICD-10-CM | POA: Diagnosis not present

## 2020-08-01 DIAGNOSIS — N184 Chronic kidney disease, stage 4 (severe): Secondary | ICD-10-CM | POA: Diagnosis present

## 2020-08-01 DIAGNOSIS — Z9119 Patient's noncompliance with other medical treatment and regimen: Secondary | ICD-10-CM

## 2020-08-01 DIAGNOSIS — K767 Hepatorenal syndrome: Secondary | ICD-10-CM | POA: Diagnosis present

## 2020-08-01 DIAGNOSIS — K7682 Hepatic encephalopathy: Secondary | ICD-10-CM

## 2020-08-01 DIAGNOSIS — D61818 Other pancytopenia: Secondary | ICD-10-CM | POA: Diagnosis present

## 2020-08-01 DIAGNOSIS — K766 Portal hypertension: Secondary | ICD-10-CM | POA: Diagnosis present

## 2020-08-01 DIAGNOSIS — Z794 Long term (current) use of insulin: Secondary | ICD-10-CM

## 2020-08-01 DIAGNOSIS — N5089 Other specified disorders of the male genital organs: Secondary | ICD-10-CM | POA: Diagnosis present

## 2020-08-01 DIAGNOSIS — I5032 Chronic diastolic (congestive) heart failure: Secondary | ICD-10-CM | POA: Diagnosis present

## 2020-08-01 DIAGNOSIS — G4730 Sleep apnea, unspecified: Secondary | ICD-10-CM | POA: Diagnosis present

## 2020-08-01 DIAGNOSIS — K22719 Barrett's esophagus with dysplasia, unspecified: Secondary | ICD-10-CM | POA: Diagnosis not present

## 2020-08-01 DIAGNOSIS — I872 Venous insufficiency (chronic) (peripheral): Secondary | ICD-10-CM | POA: Diagnosis present

## 2020-08-01 DIAGNOSIS — K2270 Barrett's esophagus without dysplasia: Secondary | ICD-10-CM | POA: Diagnosis present

## 2020-08-01 DIAGNOSIS — Z7189 Other specified counseling: Secondary | ICD-10-CM | POA: Diagnosis not present

## 2020-08-01 DIAGNOSIS — J449 Chronic obstructive pulmonary disease, unspecified: Secondary | ICD-10-CM | POA: Diagnosis present

## 2020-08-01 DIAGNOSIS — K7460 Unspecified cirrhosis of liver: Secondary | ICD-10-CM | POA: Diagnosis present

## 2020-08-01 DIAGNOSIS — D638 Anemia in other chronic diseases classified elsewhere: Secondary | ICD-10-CM | POA: Diagnosis present

## 2020-08-01 DIAGNOSIS — K3189 Other diseases of stomach and duodenum: Secondary | ICD-10-CM | POA: Diagnosis present

## 2020-08-01 DIAGNOSIS — Z20822 Contact with and (suspected) exposure to covid-19: Secondary | ICD-10-CM | POA: Diagnosis present

## 2020-08-01 DIAGNOSIS — I130 Hypertensive heart and chronic kidney disease with heart failure and stage 1 through stage 4 chronic kidney disease, or unspecified chronic kidney disease: Secondary | ICD-10-CM | POA: Diagnosis present

## 2020-08-01 DIAGNOSIS — R32 Unspecified urinary incontinence: Secondary | ICD-10-CM | POA: Diagnosis present

## 2020-08-01 DIAGNOSIS — R197 Diarrhea, unspecified: Secondary | ICD-10-CM | POA: Diagnosis not present

## 2020-08-01 DIAGNOSIS — I8510 Secondary esophageal varices without bleeding: Secondary | ICD-10-CM | POA: Diagnosis present

## 2020-08-01 DIAGNOSIS — Z79899 Other long term (current) drug therapy: Secondary | ICD-10-CM

## 2020-08-01 DIAGNOSIS — N179 Acute kidney failure, unspecified: Secondary | ICD-10-CM | POA: Diagnosis present

## 2020-08-01 DIAGNOSIS — F419 Anxiety disorder, unspecified: Secondary | ICD-10-CM | POA: Diagnosis not present

## 2020-08-01 DIAGNOSIS — Z87891 Personal history of nicotine dependence: Secondary | ICD-10-CM

## 2020-08-01 DIAGNOSIS — N400 Enlarged prostate without lower urinary tract symptoms: Secondary | ICD-10-CM | POA: Diagnosis present

## 2020-08-01 DIAGNOSIS — N183 Chronic kidney disease, stage 3 unspecified: Secondary | ICD-10-CM

## 2020-08-01 DIAGNOSIS — K7290 Hepatic failure, unspecified without coma: Principal | ICD-10-CM | POA: Diagnosis present

## 2020-08-01 DIAGNOSIS — Z9114 Patient's other noncompliance with medication regimen: Secondary | ICD-10-CM

## 2020-08-01 LAB — CBC WITH DIFFERENTIAL/PLATELET
Abs Immature Granulocytes: 0.02 10*3/uL (ref 0.00–0.07)
Basophils Absolute: 0 10*3/uL (ref 0.0–0.1)
Basophils Relative: 0 %
Eosinophils Absolute: 0 10*3/uL (ref 0.0–0.5)
Eosinophils Relative: 0 %
HCT: 24.2 % — ABNORMAL LOW (ref 39.0–52.0)
Hemoglobin: 7.8 g/dL — ABNORMAL LOW (ref 13.0–17.0)
Immature Granulocytes: 1 %
Lymphocytes Relative: 10 %
Lymphs Abs: 0.4 10*3/uL — ABNORMAL LOW (ref 0.7–4.0)
MCH: 30.5 pg (ref 26.0–34.0)
MCHC: 32.2 g/dL (ref 30.0–36.0)
MCV: 94.5 fL (ref 80.0–100.0)
Monocytes Absolute: 0.3 10*3/uL (ref 0.1–1.0)
Monocytes Relative: 7 %
Neutro Abs: 3 10*3/uL (ref 1.7–7.7)
Neutrophils Relative %: 82 %
Platelets: 82 10*3/uL — ABNORMAL LOW (ref 150–400)
RBC: 2.56 MIL/uL — ABNORMAL LOW (ref 4.22–5.81)
RDW: 15.6 % — ABNORMAL HIGH (ref 11.5–15.5)
WBC: 3.7 10*3/uL — ABNORMAL LOW (ref 4.0–10.5)
nRBC: 0 % (ref 0.0–0.2)

## 2020-08-01 LAB — COMPREHENSIVE METABOLIC PANEL
ALT: 20 U/L (ref 0–44)
AST: 30 U/L (ref 15–41)
Albumin: 2.9 g/dL — ABNORMAL LOW (ref 3.5–5.0)
Alkaline Phosphatase: 78 U/L (ref 38–126)
Anion gap: 10 (ref 5–15)
BUN: 92 mg/dL — ABNORMAL HIGH (ref 8–23)
CO2: 24 mmol/L (ref 22–32)
Calcium: 8.5 mg/dL — ABNORMAL LOW (ref 8.9–10.3)
Chloride: 109 mmol/L (ref 98–111)
Creatinine, Ser: 2.67 mg/dL — ABNORMAL HIGH (ref 0.61–1.24)
GFR, Estimated: 25 mL/min — ABNORMAL LOW (ref >60)
Glucose, Bld: 187 mg/dL — ABNORMAL HIGH (ref 70–99)
Potassium: 3.9 mmol/L (ref 3.5–5.1)
Sodium: 143 mmol/L (ref 135–145)
Total Bilirubin: 1.1 mg/dL (ref 0.3–1.2)
Total Protein: 6.4 g/dL — ABNORMAL LOW (ref 6.5–8.1)

## 2020-08-01 LAB — PROTIME-INR
INR: 1.4 — ABNORMAL HIGH (ref 0.8–1.2)
Prothrombin Time: 16.6 seconds — ABNORMAL HIGH (ref 11.4–15.2)

## 2020-08-01 LAB — CBG MONITORING, ED: Glucose-Capillary: 137 mg/dL — ABNORMAL HIGH (ref 70–99)

## 2020-08-01 LAB — RESPIRATORY PANEL BY RT PCR (FLU A&B, COVID)
Influenza A by PCR: NEGATIVE
Influenza B by PCR: NEGATIVE
SARS Coronavirus 2 by RT PCR: NEGATIVE

## 2020-08-01 LAB — AMMONIA: Ammonia: 109 umol/L — ABNORMAL HIGH (ref 9–35)

## 2020-08-01 MED ORDER — SODIUM CHLORIDE 0.9% FLUSH
3.0000 mL | Freq: Two times a day (BID) | INTRAVENOUS | Status: DC
  Administered 2020-08-01 – 2020-08-10 (×15): 3 mL via INTRAVENOUS

## 2020-08-01 MED ORDER — TAMSULOSIN HCL 0.4 MG PO CAPS
0.4000 mg | ORAL_CAPSULE | Freq: Every day | ORAL | Status: DC
  Administered 2020-08-01 – 2020-08-09 (×9): 0.4 mg via ORAL
  Filled 2020-08-01 (×9): qty 1

## 2020-08-01 MED ORDER — LACTULOSE 10 GM/15ML PO SOLN
30.0000 g | Freq: Once | ORAL | Status: DC
  Filled 2020-08-01: qty 60

## 2020-08-01 MED ORDER — SODIUM CHLORIDE 0.9% FLUSH: 3.0000 mL | INTRAVENOUS | Status: DC | PRN

## 2020-08-01 MED ORDER — TORSEMIDE 100 MG PO TABS
100.0000 mg | ORAL_TABLET | Freq: Every day | ORAL | Status: DC
  Administered 2020-08-01 – 2020-08-10 (×10): 100 mg via ORAL
  Filled 2020-08-01 (×7): qty 5
  Filled 2020-08-01 (×2): qty 1
  Filled 2020-08-01 (×2): qty 5

## 2020-08-01 MED ORDER — PANTOPRAZOLE SODIUM 40 MG PO TBEC
40.0000 mg | DELAYED_RELEASE_TABLET | Freq: Two times a day (BID) | ORAL | Status: DC
  Administered 2020-08-01 – 2020-08-10 (×19): 40 mg via ORAL
  Filled 2020-08-01 (×19): qty 1

## 2020-08-01 MED ORDER — ATORVASTATIN CALCIUM 20 MG PO TABS
20.0000 mg | ORAL_TABLET | Freq: Every evening | ORAL | Status: DC
  Administered 2020-08-01 – 2020-08-09 (×9): 20 mg via ORAL
  Filled 2020-08-01 (×9): qty 1

## 2020-08-01 MED ORDER — POTASSIUM CHLORIDE CRYS ER 20 MEQ PO TBCR
20.0000 meq | EXTENDED_RELEASE_TABLET | Freq: Two times a day (BID) | ORAL | Status: DC
  Administered 2020-08-01 – 2020-08-10 (×19): 20 meq via ORAL
  Filled 2020-08-01 (×19): qty 1

## 2020-08-01 MED ORDER — LACTULOSE 10 GM/15ML PO SOLN
45.0000 g | Freq: Two times a day (BID) | ORAL | Status: DC
  Administered 2020-08-01 – 2020-08-03 (×4): 45 g via ORAL
  Filled 2020-08-01 (×5): qty 90

## 2020-08-01 MED ORDER — CARVEDILOL 3.125 MG PO TABS
3.1250 mg | ORAL_TABLET | Freq: Two times a day (BID) | ORAL | Status: DC
  Administered 2020-08-01 – 2020-08-09 (×16): 3.125 mg via ORAL
  Filled 2020-08-01 (×17): qty 1

## 2020-08-01 MED ORDER — SALINE SPRAY 0.65 % NA SOLN
1.0000 | NASAL | Status: DC | PRN
  Filled 2020-08-01: qty 44

## 2020-08-01 MED ORDER — FERROUS SULFATE 325 (65 FE) MG PO TABS
325.0000 mg | ORAL_TABLET | Freq: Every day | ORAL | Status: DC
  Administered 2020-08-01 – 2020-08-10 (×10): 325 mg via ORAL
  Filled 2020-08-01 (×10): qty 1

## 2020-08-01 MED ORDER — SODIUM CHLORIDE 0.9 % IV SOLN: 250.0000 mL | INTRAVENOUS | Status: DC | PRN

## 2020-08-01 MED ORDER — LORAZEPAM 0.5 MG PO TABS
0.5000 mg | ORAL_TABLET | Freq: Three times a day (TID) | ORAL | Status: DC | PRN
  Administered 2020-08-01 – 2020-08-10 (×11): 0.5 mg via ORAL
  Filled 2020-08-01 (×11): qty 1

## 2020-08-01 MED ORDER — RIFAXIMIN 550 MG PO TABS
550.0000 mg | ORAL_TABLET | Freq: Two times a day (BID) | ORAL | Status: DC
  Administered 2020-08-01 – 2020-08-10 (×19): 550 mg via ORAL
  Filled 2020-08-01 (×20): qty 1

## 2020-08-01 NOTE — ED Provider Notes (Signed)
[facility] Emergency Department Provider Note    First MD Initiated Contact with Patient 08/01/20 [phone]     (approximate)  I have reviewed the triage vital signs and the nursing notes.   HISTORY  Chief Complaint See Nursing Note    HPI Corey West is a 69 y.o. male with very complex past medical history recent admission to the hospital represents to the ER due to worsening confusion generalized weakness concerned that he is not taking his medications right.  States that his nephew is supposed to be taking care of him but he is worried that his nephew is stealing from him.  States that he is supposed to be going on to get food and medicines for him but will be using 2 tanks full of gas just going back and forth to get food.  States that the nephew will be gone all night.  States that he is taken only half of his lactulose.  Denies any bleeding.  No fevers.    Past Medical History:  Diagnosis Date  . A-fib ([place])   . Anemia   . Anxiety    controlled;   . Arthritis   . AVM (arteriovenous malformation) of stomach, acquired with hemorrhage   . Barrett's esophagus   . CHF (congestive heart failure) ([place])   . Chronic kidney disease    renal infufficiency  . Cirrhosis ([place])   . COPD (chronic obstructive pulmonary disease) ([place])   . Depression    controlled;   Marland Kitchen Diabetes mellitus without complication ([place])    not controlled, taking insulin but sugar continues to run high;   Marland Kitchen Dysrhythmia   . Edema   . Esophageal varices ([place])   . GAVE (gastric antral vascular ectasia)   . GERD (gastroesophageal reflux disease)   . Hepatic encephalopathy ([place])   . History of hiatal hernia   . Hyperlipidemia   . Hypertension    controlled well;   . IPMN (intraductal papillary mucinous neoplasm)   . Liver cirrhosis ([place])   . Nephropathy, diabetic ([place])   . Obesity   . Pancytopenia ([place])   . Polyp, stomach    with chronic blood loss  . Portal hypertensive  gastropathy ([place])   . Sleep apnea    does not wear a cpap, Medicare would not pay for it   . Venous stasis dermatitis of both lower extremities   . Venous stasis of both lower extremities    with cellulitis   Family History  Problem Relation Age of Onset  . Diabetes Other   . Transient ischemic attack Father   . CAD Father    Past Surgical History:  Procedure Laterality Date  . ESOPHAGOGASTRODUODENOSCOPY N/A 02/09/2015   Procedure: ESOPHAGOGASTRODUODENOSCOPY (EGD);  Surgeon: Manya Silvas, MD;  Location: [facility] ENDOSCOPY;  Service: Endoscopy;  Laterality: N/A;  . ESOPHAGOGASTRODUODENOSCOPY N/A 07/22/2015   Procedure: ESOPHAGOGASTRODUODENOSCOPY (EGD);  Surgeon: Manya Silvas, MD;  Location: [facility] ENDOSCOPY;  Service: Endoscopy;  Laterality: N/A;  . ESOPHAGOGASTRODUODENOSCOPY N/A 06/26/2017   Procedure: ESOPHAGOGASTRODUODENOSCOPY (EGD);  Surgeon: Manya Silvas, MD;  Location: [facility] ENDOSCOPY;  Service: Endoscopy;  Laterality: N/A;  . ESOPHAGOGASTRODUODENOSCOPY N/A 06/27/2017   Procedure: ESOPHAGOGASTRODUODENOSCOPY (EGD);  Surgeon: Manya Silvas, MD;  Location[facility] ENDOSCOPY;  Service: Endoscopy;  Laterality: N/A;  . ESOPHAGOGASTRODUODENOSCOPY N/A 06/28/2017   Procedure: ESOPHAGOGASTRODUODENOSCOPY (EGD);  Surgeon: Manya Silvas, MD;  Location: [facility] ENDOSCOPY;  Service: Endoscopy;  Laterality: N/A;  . ESOPHAGOGASTRODUODENOSCOPY Bilateral 10/11/2017   Procedure:  ESOPHAGOGASTRODUODENOSCOPY (EGD);  Surgeon: Manya Silvas, MD;  Location: [facility] ENDOSCOPY;  Service: Endoscopy;  Laterality: Bilateral;  . ESOPHAGOGASTRODUODENOSCOPY N/A 12/04/2017   Procedure: ESOPHAGOGASTRODUODENOSCOPY (EGD);  Surgeon: Manya Silvas, MD;  Location: [facility] ENDOSCOPY;  Service: Endoscopy;  Laterality: N/A;  . ESOPHAGOGASTRODUODENOSCOPY N/A 03/06/2019   Procedure: ESOPHAGOGASTRODUODENOSCOPY (EGD);  Surgeon: Toledo, Benay Pike, MD;  Location: [facility] ENDOSCOPY;  Service: Gastroenterology;  Laterality: N/A;  .  ESOPHAGOGASTRODUODENOSCOPY (EGD) WITH PROPOFOL N/A 07/20/2015   Procedure: ESOPHAGOGASTRODUODENOSCOPY (EGD) WITH PROPOFOL;  Surgeon: Manya Silvas, MD;  Location: [facility] ENDOSCOPY;  Service: Endoscopy;  Laterality: N/A;  . ESOPHAGOGASTRODUODENOSCOPY (EGD) WITH PROPOFOL N/A 09/16/2015   Procedure: ESOPHAGOGASTRODUODENOSCOPY (EGD) WITH PROPOFOL;  Surgeon: Manya Silvas, MD;  Location: [facility] ENDOSCOPY;  Service: Endoscopy;  Laterality: N/A;  . ESOPHAGOGASTRODUODENOSCOPY (EGD) WITH PROPOFOL N/A 03/16/2016   Procedure: ESOPHAGOGASTRODUODENOSCOPY (EGD) WITH PROPOFOL;  Surgeon: Manya Silvas, MD;  Location: [facility] ENDOSCOPY;  Service: Endoscopy;  Laterality: N/A;  . ESOPHAGOGASTRODUODENOSCOPY (EGD) WITH PROPOFOL N/A 09/14/2016   Procedure: ESOPHAGOGASTRODUODENOSCOPY (EGD) WITH PROPOFOL;  Surgeon: Manya Silvas, MD;  Location: [facility] ENDOSCOPY;  Service: Endoscopy;  Laterality: N/A;  . ESOPHAGOGASTRODUODENOSCOPY (EGD) WITH PROPOFOL N/A 11/05/2016   Procedure: ESOPHAGOGASTRODUODENOSCOPY (EGD) WITH PROPOFOL;  Surgeon: Manya Silvas, MD;  Location: [facility] ENDOSCOPY;  Service: Endoscopy;  Laterality: N/A;  . ESOPHAGOGASTRODUODENOSCOPY (EGD) WITH PROPOFOL N/A 02/06/2017   Procedure: ESOPHAGOGASTRODUODENOSCOPY (EGD) WITH PROPOFOL;  Surgeon: Manya Silvas, MD;  Location: [facility] ENDOSCOPY;  Service: Endoscopy;  Laterality: N/A;  . ESOPHAGOGASTRODUODENOSCOPY (EGD) WITH PROPOFOL N/A 04/24/2017   Procedure: ESOPHAGOGASTRODUODENOSCOPY (EGD) WITH PROPOFOL;  Surgeon: Manya Silvas, MD;  Location: [facility] ENDOSCOPY;  Service: Endoscopy;  Laterality: N/A;  . ESOPHAGOGASTRODUODENOSCOPY (EGD) WITH PROPOFOL N/A 07/31/2017   Procedure: ESOPHAGOGASTRODUODENOSCOPY (EGD) WITH PROPOFOL;  Surgeon: Manya Silvas, MD;  Location: [facility] ENDOSCOPY;  Service: Endoscopy;  Laterality: N/A;  . ESOPHAGOGASTRODUODENOSCOPY (EGD) WITH PROPOFOL N/A 08/08/2018   Procedure: ESOPHAGOGASTRODUODENOSCOPY (EGD) WITH PROPOFOL;  Surgeon: Manya Silvas, MD;  Location: [facility] ENDOSCOPY;  Service: Endoscopy;  Laterality: N/A;  . ESOPHAGOGASTRODUODENOSCOPY (EGD) WITH PROPOFOL N/A 05/21/2020   Procedure: ESOPHAGOGASTRODUODENOSCOPY (EGD) WITH PROPOFOL;  Surgeon: [name] Manifold, MD;  Location: [facility] ENDOSCOPY;  Service: Endoscopy;  Laterality: N/A;  . ESOPHAGOGASTRODUODENOSCOPY (EGD) WITH PROPOFOL N/A 07/18/2020   Procedure: ESOPHAGOGASTRODUODENOSCOPY (EGD) WITH PROPOFOL;  Surgeon: Toledo, Benay Pike, MD;  Location: [facility] ENDOSCOPY;  Service: Gastroenterology;  Laterality: N/A;  . GIVENS CAPSULE STUDY N/A 12/05/2017   Procedure: GIVENS CAPSULE STUDY;  Surgeon: Manya Silvas, MD;  Location: [facility] ENDOSCOPY;  Service: Endoscopy;  Laterality: N/A;  . TONSILLECTOMY    . TONSILLECTOMY AND ADENOIDECTOMY    . ULNAR NERVE TRANSPOSITION    . UVULOPALATOPHARYNGOPLASTY     Patient Active Problem List   Diagnosis Date Noted  . GI bleed 07/26/2020  . Acute on chronic anemia 07/25/2020  . Generalized weakness 07/25/2020  . SIRS (systemic inflammatory response syndrome) ([place]) 07/07/2020  . Anemia   . Hypothermia   . Anemia of chronic disease 06/07/2020  . Prolonged QT interval 06/07/2020  . Hypoglycemia 06/07/2020  . Ascites 06/07/2020  . Portal hypertensive gastropathy ([place])   . Symptomatic anemia 05/19/2020  . Acute blood loss anemia 05/19/2020  . Acute GI bleeding 05/19/2020  . Bacteremia due to Klebsiella pneumoniae 04/26/2020  . Palliative care by specialist   . DNR (do not resuscitate) discussion   . COPD with chronic bronchitis ([place]) 04/23/2020  . Paroxysmal atrial fibrillation ([place]) 04/20/2020  . Cellulitis of left  leg 02/23/2020  . Contusion of ribs, left, initial encounter 02/23/2020  . Acute hypoxemic respiratory failure ([place]) 02/04/2020  . Volume overload 01/28/2020  . Hyperlipidemia associated with type 2 diabetes mellitus ([place]) 01/28/2020  . Liver cirrhosis ([place])   . Anxiety   . SOB (shortness of breath)   . Elevated  troponin   . CKD (chronic kidney disease) stage 4, GFR 15-29 ml/min (HCC)   . Unstageable pressure ulcer of sacral region ([place]) 11/28/2019  . Chronic diastolic CHF (congestive heart failure) ([place]) 11/27/2019  . Type II diabetes mellitus with renal manifestations ([place]) 11/27/2019  . Cellulitis of left lower extremity 11/27/2019  . Cellulitis of lower extremity 11/27/2019  . Cyst of pancreas 05/18/2019  . HCAP (healthcare-associated pneumonia) 02/03/2019  . Aphasia 01/14/2019  . Acute on chronic renal failure ([place]) 01/14/2019  . Malnutrition of moderate degree 12/03/2018  . C. difficile diarrhea 12/02/2018  . Ankle fracture, left, closed, initial encounter 10/15/2018  . Ankle fracture, right, closed, initial encounter 10/15/2018  . Ankle fracture 10/15/2018  . Pressure injury of skin 07/19/2018  . UTI (urinary tract infection) 07/18/2018  . Acute kidney injury superimposed on CKD ([place]) 01/02/2018  . 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 Goals of care, counseling/discussion   . Palliative care encounter   . Acute hepatic encephalopathy 12/16/2017  . Hepatic encephalopathy ([place]) 10/18/2017  . Hypokalemia 10/09/2017  . Hyperglycemia 10/09/2017  . Sepsis ([place]) 06/12/2017  . Type 2 diabetes mellitus with diabetic nephropathy, with long-term current use of insulin ([place]) 05/29/2017  . Long term (current) use of insulin ([place]) 05/29/2017  . Cellulitis in diabetic foot ([place]) 02/21/2017  . Cellulitis 02/21/2017  . Upper GI bleed/melena 07/20/2015  . Hematemesis 02/08/2015  . Cirrhosis ([place]) 02/08/2015  . Esophageal varices ([place]) 02/08/2015  . Gastroesophageal reflux disease 02/08/2015  . Hyperlipidemia 02/08/2015  . Chronic venous stasis dermatitis of both lower extremities 10/06/2014  . Cutaneous abscess of limb, unspecified 09/17/2014  . Edema 05/25/2014  . Hypertension 05/25/2014  . Obesity, Class III, BMI 40-49.9 (morbid obesity) ([place]) 05/25/2014  . Sleep apnea  05/25/2014  . Barrett's esophagus 05/25/2014  . Pancytopenia ([place]) 05/25/2014  . History of esophageal varices 05/25/2014  . Iron deficiency anemia 12/04/2013  . Thrombocytopenia ([place]) 12/04/2013  . Splenomegaly 11/04/2013      Prior to Admission medications   Medication Sig Start Date End Date Taking? Authorizing Provider  atorvastatin (LIPITOR) 10 MG tablet Take 20 mg by mouth every evening.  03/23/20   [provider]  carvedilol (COREG) 3.125 MG tablet Take 1 tablet (3.125 mg total) by mouth 2 (two) times daily with a meal. 07/30/20   Florencia Reasons, MD  ferrous sulfate 325 (65 FE) MG tablet Take 1 tablet (325 mg total) by mouth daily. This medication can cause black stools 06/11/20   Wyvonnia Dusky, MD  lactulose (CHRONULAC) 10 GM/15ML solution Take 90 mLs (60 g total) by mouth daily. Patient taking differently: Take 45 g by mouth 2 (two) times daily.  05/04/20   Ezekiel Slocumb, DO  LORazepam (ATIVAN) 0.5 MG tablet Take 1 tablet (0.5 mg total) by mouth 3 (three) times daily as needed for Anxiety    [provider]  metolazone (ZAROXOLYN) 5 MG tablet Take 5 mg by mouth daily. . 05/13/20   [provider]  NOVOLOG MIX 70/30 FLEXPEN (70-30) 100 UNIT/ML FlexPen 0-84 Units as directed. Inject 0 - 84 Units (sliding scale) Subcutaneously twice a day before meals, at  breakfast and at dinner. 03/17/20   [provider]  pantoprazole (PROTONIX) 40 MG tablet Take 1 tablet (40 mg total) by mouth 2 (two) times daily. 03/06/19 07/25/20  Dustin Flock, MD  potassium chloride SA (KLOR-CON) 20 MEQ tablet Take 20 mEq by mouth 2 (two) times daily. 07/06/20   [provider]  rifaximin (XIFAXAN) 550 MG TABS tablet Take 1 tablet (550 mg total) by mouth 2 (two) times daily. 02/12/20   Loletha Grayer, MD  sodium chloride (OCEAN) 0.65 % SOLN nasal spray Place 1 spray into both nostrils as needed for congestion (nose irritation). 02/12/20   Loletha Grayer, MD    tamsulosin (FLOMAX) 0.4 MG CAPS capsule Take 1 capsule (0.4 mg total) by mouth daily after supper. 05/03/20   Ezekiel Slocumb, DO  torsemide (DEMADEX) 100 MG tablet Take 1 tablet (100 mg total) by mouth daily. 05/04/20   Ezekiel Slocumb, DO    Allergies Patient has no known allergies.    Social History Social History   Tobacco Use  . Smoking status: Former Smoker    Years: 20.00    Types: Cigars, Cigarettes  . Smokeless tobacco: Never Used  Vaping Use  . Vaping Use: Never used  Substance Use Topics  . Alcohol use: No    Comment: stopped 15 years ago  . Drug use: No    Review of Systems Patient denies headaches, rhinorrhea, blurry vision, numbness, shortness of breath, chest pain, edema, cough, abdominal pain, nausea, vomiting, diarrhea, dysuria, fevers, rashes or hallucinations unless otherwise stated above in HPI. ____________________________________________   PHYSICAL EXAM:  VITAL SIGNS: Vitals:   08/01/20 0626  BP: (!) 162/45  Pulse: 73  Resp: 20  Temp: 97.6 F (36.4 C)  SpO2: 99%    Constitutional: Alert, anxious and tearful Eyes: Conjunctivae are normal.  Head: Atraumatic. Nose: No congestion/rhinnorhea. Mouth/Throat: Mucous membranes are moist.   Neck: No stridor. Painless ROM.  Cardiovascular: Normal rate, regular rhythm. Grossly normal heart sounds.  Good peripheral circulation. Respiratory: Normal respiratory effort.  No retractions. Lungs CTAB. Gastrointestinal: Soft and nontender. No distention. No abdominal bruits. No CVA tenderness. Genitourinary: deferred Musculoskeletal: No lower extremity tenderness, 2+ BLE edema.  No joint effusions. Neurologic:  Normal speech and language. No gross focal neurologic deficits are appreciated. No facial droop Skin:  Skin is warm, dry and intact. No rash noted. Psychiatric: anxious and tearful  ____________________________________________   LABS (all labs ordered are listed, but only abnormal results are  displayed)  Results for orders placed or performed during the hospital encounter of 08/01/20 (from the past 24 hour(s))  CBC with Differential     Status: Abnormal   Collection Time: 08/01/20  6:35 AM  Result Value Ref Range   WBC 3.7 (L) 4.0 - 10.5 K/uL   RBC 2.56 (L) 4.22 - 5.81 MIL/uL   Hemoglobin 7.8 (L) 13.0 - 17.0 g/dL   HCT 24.2 (L) 39 - 52 %   MCV 94.5 80.0 - 100.0 fL   MCH 30.5 26.0 - 34.0 pg   MCHC 32.2 30.0 - 36.0 g/dL   RDW 15.6 (H) 11.5 - 15.5 %   Platelets 82 (L) 150 - 400 K/uL   nRBC 0.0 0.0 - 0.2 %   Neutrophils Relative % 82 %   Neutro Abs 3.0 1.7 - 7.7 K/uL   Lymphocytes Relative 10 %   Lymphs Abs 0.4 (L) 0.7 - 4.0 K/uL   Monocytes Relative 7 %   Monocytes Absolute 0.3 0.1 - 1.0  K/uL   Eosinophils Relative 0 %   Eosinophils Absolute 0.0 0.0 - 0.5 K/uL   Basophils Relative 0 %   Basophils Absolute 0.0 0.0 - 0.1 K/uL   Immature Granulocytes 1 %   Abs Immature Granulocytes 0.02 0.00 - 0.07 K/uL  Comprehensive metabolic panel     Status: Abnormal   Collection Time: 08/01/20  6:35 AM  Result Value Ref Range   Sodium 143 135 - 145 mmol/L   Potassium 3.9 3.5 - 5.1 mmol/L   Chloride 109 98 - 111 mmol/L   CO2 24 22 - 32 mmol/L   Glucose, Bld 187 (H) 70 - 99 mg/dL   BUN 92 (H) 8 - 23 mg/dL   Creatinine, Ser 2.67 (H) 0.61 - 1.24 mg/dL   Calcium 8.5 (L) 8.9 - 10.3 mg/dL   Total Protein 6.4 (L) 6.5 - 8.1 g/dL   Albumin 2.9 (L) 3.5 - 5.0 g/dL   AST 30 15 - 41 U/L   ALT 20 0 - 44 U/L   Alkaline Phosphatase 78 38 - 126 U/L   Total Bilirubin 1.1 0.3 - 1.2 mg/dL   GFR, Estimated 25 (L) >60 mL/min   Anion gap 10 5 - 15  Ammonia     Status: Abnormal   Collection Time: 08/01/20  8:41 AM  Result Value Ref Range   Ammonia 109 (H) 9 - 35 umol/L  Protime-INR     Status: Abnormal   Collection Time: 08/01/20  8:41 AM  Result Value Ref Range   Prothrombin Time 16.6 (H) 11.4 - 15.2 seconds   INR 1.4 (H) 0.8 - 1.2    ____________________________________________  ____________________________________________  RADIOLOGY   ____________________________________________   PROCEDURES  Procedure(s) performed:  Procedures    Critical Care performed: no ____________________________________________   INITIAL IMPRESSION / ASSESSMENT AND PLAN / ED COURSE  Pertinent labs & imaging results that were available during my care of the patient were reviewed by me and considered in my medical decision making (see chart for details).   DDX: Hepatic encephalopathy, deconditioning, AKI, anemia, failure to thrive, medication noncompliance  LORAN FLEET is a 69 y.o. who presents to the ED with presentation as described above.  Patient does seem somewhat confused he is also anxious.  Having seen him before with history of cirrhosis concern for recurrent hepatic encephalopathy.  Will add on ammonia.  Is remainder of his blood work is otherwise roughly at baseline.  No significant acute anemia.  No symptoms to suggest bleeding or sepsis.  His exam is with benign abdomen.  Does have some mild asterixis.  Sounds like there are some safety concerns at home therefore will consult social work and case management.  Clinical Course as of Aug 01 921  [facility] Aug 01, 2020  0920 Bladder does show evidence of hyperammonemia.  Suspect a large component of hepatic encephalopathy and likely medication noncompliance.  Will consult with hospitalist for admission.   [PR]    Clinical Course User Index [PR] Merlyn Lot, MD    The patient was evaluated in Emergency Department today for the symptoms described in the history of present illness. He/she was evaluated in the context of the global COVID-19 pandemic, which necessitated consideration that the patient might be at risk for infection with the SARS-CoV-2 virus that causes COVID-19. Institutional protocols and algorithms that pertain to the evaluation of patients at risk for  COVID-19 are in a state of rapid change based on information released by regulatory bodies including the CDC  and federal and state organizations. These policies and algorithms were followed during the patient's care in the ED.  As part of my medical decision making, I reviewed the following data within the [place] notes reviewed and incorporated, Labs reviewed, notes from prior ED visits and [place] Controlled Substance Database   ____________________________________________   FINAL CLINICAL IMPRESSION(S) / ED DIAGNOSES  Final diagnoses:  Hepatic encephalopathy ([place])      NEW MEDICATIONS STARTED DURING THIS VISIT:  New Prescriptions   No medications on file     Note:  This document was prepared using Dragon voice recognition software and may include unintentional dictation errors.    Merlyn Lot, MD 08/01/20 [phone]

## 2020-08-01 NOTE — ED Triage Notes (Signed)
Patient states he was recently discharged from the hospital.  Patient states that his nephew is supposed to be helping him but does not feel like he is actually taking care of him well and is concerned his medications might have been confused.

## 2020-08-01 NOTE — Progress Notes (Addendum)
Unna boots removed from pt's legs to assess skin. Legs with minimal erythema, 3+ edema which pt states is near baseline. LLE has approx 1.5 circular ulcer on front of shin. No drainage. MD notifed and orders to place [facility] consult. Pt requests unna boots left off overnight "to give me a break from them." Petroleum gauze, nonstick bandage and kerlex wrapped around ulcer site on LLE.

## 2020-08-01 NOTE — ED Notes (Signed)
Pt sitting up drinking diet [place], respirations even and non-labored - NAD noted, pt awaiting transport to room 126 at this time.

## 2020-08-01 NOTE — ED Notes (Signed)
Brief placed on pt and bedside commode in place

## 2020-08-01 NOTE — ED Notes (Signed)
Pt changed by Student RN's. Male purewick in place. Pt given diet sprite as requested. Pt denies any further needs at this time

## 2020-08-01 NOTE — ED Notes (Signed)
Pt Alert and Oriented and sitting up on the gurney - transferred to 1C, Room 126 in stable condition.

## 2020-08-01 NOTE — H&P (Signed)
History and Physical    LYLE NIBLETT FMB:[id number] DOB: 01/17/1951 DOA: 08/01/2020  PCP: Leonel Ramsay, MD   Patient coming from: Home  I have personally briefly reviewed patient's old medical records in [place]  Chief Complaint: Confusion  HPI: Corey West is a 69 y.o. male with medical history significant  forliver cirrhosis complicated by severe portal hypertensive gastropathy, hepatic encephalopathy and esophageal varices, chronic diastolic heart failure, chronic lower extremity venous stasis , type 2 diabetes mellitus with complications of stage III chronic kidney disease, hypertension, gastric antral vascular ectasias, chronic multifactorial anemia  with baseline hemoglobin of 7-8, generalized anxiety disorder, BPH,who was recently discharged from [facility] on 10/30/2021after he was evaluated for acute on chronic anemia.  He had an upper endoscopy which showed severe portal hypertensive gastropathy as well as esophageal mucosal changes secondary to established Barrett's disease in the lower third of the esophagus.  Palliative care was recommended during his last hospitalization which he declined. He was brought in today by EMS for evaluation of confusion patient stated that his nephew who was supposed to be helping him at home is not taking care of him and is not administering his medications as prescribed. Labs show sodium 140, potassium 3.9, chloride 109, bicarb 24, glucose 187, BUN 19, creatinine 2.67, calcium 8.5, alkaline phosphatase 78, albumin 2.9, AST 30, ALT 20, total protein 6.4, ammonia 109, total bilirubin 1.1, white count 3.7, hemoglobin 7.8, K 24 point, MCV 94.5, RDW 15.6, platelet count 82, PT 16.6, INR 1.4    ED Course: Patient is a 69 year old Caucasian male with a history of liver cirrhosis who was brought into the emergency room for evaluation of confusion.  Patient was recently discharged from the hospital after evaluation for  acute on chronic anemia requiring blood transfusion.  He had an upper endoscopy which showed findings of severe portal gastropathy as well as mucosal changes related to Barrett's esophagus.  Patient is noted to have elevated ammonia level and will be admitted to the hospital for further evaluation.  Review of Systems: As per HPI otherwise 10 point review of systems negative.    Past Medical History:  Diagnosis Date  . A-fib ([place])   . Anemia   . Anxiety    controlled;   . Arthritis   . AVM (arteriovenous malformation) of stomach, acquired with hemorrhage   . Barrett's esophagus   . CHF (congestive heart failure) ([place])   . Chronic kidney disease    renal infufficiency  . Cirrhosis ([place])   . COPD (chronic obstructive pulmonary disease) ([place])   . Depression    controlled;   Marland Kitchen Diabetes mellitus without complication ([place])    not controlled, taking insulin but sugar continues to run high;   Marland Kitchen Dysrhythmia   . Edema   . Esophageal varices ([place])   . GAVE (gastric antral vascular ectasia)   . GERD (gastroesophageal reflux disease)   . Hepatic encephalopathy ([place])   . History of hiatal hernia   . Hyperlipidemia   . Hypertension    controlled well;   . IPMN (intraductal papillary mucinous neoplasm)   . Liver cirrhosis ([place])   . Nephropathy, diabetic ([place])   . Obesity   . Pancytopenia ([place])   . Polyp, stomach    with chronic blood loss  . Portal hypertensive gastropathy ([place])   . Sleep apnea    does not wear a cpap, Medicare would not pay for it   . Venous stasis dermatitis  of both lower extremities   . Venous stasis of both lower extremities    with cellulitis    Past Surgical History:  Procedure Laterality Date  . ESOPHAGOGASTRODUODENOSCOPY N/A 02/09/2015   Procedure: ESOPHAGOGASTRODUODENOSCOPY (EGD);  Surgeon: Manya Silvas, MD;  Location: [facility] ENDOSCOPY;  Service: Endoscopy;  Laterality: N/A;  . ESOPHAGOGASTRODUODENOSCOPY N/A 07/22/2015   Procedure:  ESOPHAGOGASTRODUODENOSCOPY (EGD);  Surgeon: Manya Silvas, MD;  Location: [facility] ENDOSCOPY;  Service: Endoscopy;  Laterality: N/A;  . ESOPHAGOGASTRODUODENOSCOPY N/A 06/26/2017   Procedure: ESOPHAGOGASTRODUODENOSCOPY (EGD);  Surgeon: Manya Silvas, MD;  Location: [facility] ENDOSCOPY;  Service: Endoscopy;  Laterality: N/A;  . ESOPHAGOGASTRODUODENOSCOPY N/A 06/27/2017   Procedure: ESOPHAGOGASTRODUODENOSCOPY (EGD);  Surgeon: Manya Silvas, MD;  Location: [facility] ENDOSCOPY;  Service: Endoscopy;  Laterality: N/A;  . ESOPHAGOGASTRODUODENOSCOPY N/A 06/28/2017   Procedure: ESOPHAGOGASTRODUODENOSCOPY (EGD);  Surgeon: Manya Silvas, MD;  Location: [facility] ENDOSCOPY;  Service: Endoscopy;  Laterality: N/A;  . ESOPHAGOGASTRODUODENOSCOPY Bilateral 10/11/2017   Procedure: ESOPHAGOGASTRODUODENOSCOPY (EGD);  Surgeon: Manya Silvas, MD;  Location: [facility] ENDOSCOPY;  Service: Endoscopy;  Laterality: Bilateral;  . ESOPHAGOGASTRODUODENOSCOPY N/A 12/04/2017   Procedure: ESOPHAGOGASTRODUODENOSCOPY (EGD);  Surgeon: Manya Silvas, MD;  Location: [facility] ENDOSCOPY;  Service: Endoscopy;  Laterality: N/A;  . ESOPHAGOGASTRODUODENOSCOPY N/A 03/06/2019   Procedure: ESOPHAGOGASTRODUODENOSCOPY (EGD);  Surgeon: Toledo, Benay Pike, MD;  Location: [facility] ENDOSCOPY;  Service: Gastroenterology;  Laterality: N/A;  . ESOPHAGOGASTRODUODENOSCOPY (EGD) WITH PROPOFOL N/A 07/20/2015   Procedure: ESOPHAGOGASTRODUODENOSCOPY (EGD) WITH PROPOFOL;  Surgeon: Manya Silvas, MD;  Location: [facility] ENDOSCOPY;  Service: Endoscopy;  Laterality: N/A;  . ESOPHAGOGASTRODUODENOSCOPY (EGD) WITH PROPOFOL N/A 09/16/2015   Procedure: ESOPHAGOGASTRODUODENOSCOPY (EGD) WITH PROPOFOL;  Surgeon: Manya Silvas, MD;  Location: [facility] ENDOSCOPY;  Service: Endoscopy;  Laterality: N/A;  . ESOPHAGOGASTRODUODENOSCOPY (EGD) WITH PROPOFOL N/A 03/16/2016   Procedure: ESOPHAGOGASTRODUODENOSCOPY (EGD) WITH PROPOFOL;  Surgeon: Manya Silvas, MD;  Location: [facility] ENDOSCOPY;  Service:  Endoscopy;  Laterality: N/A;  . ESOPHAGOGASTRODUODENOSCOPY (EGD) WITH PROPOFOL N/A 09/14/2016   Procedure: ESOPHAGOGASTRODUODENOSCOPY (EGD) WITH PROPOFOL;  Surgeon: Manya Silvas, MD;  Location: [facility] ENDOSCOPY;  Service: Endoscopy;  Laterality: N/A;  . ESOPHAGOGASTRODUODENOSCOPY (EGD) WITH PROPOFOL N/A 11/05/2016   Procedure: ESOPHAGOGASTRODUODENOSCOPY (EGD) WITH PROPOFOL;  Surgeon: Manya Silvas, MD;  Location: [facility] ENDOSCOPY;  Service: Endoscopy;  Laterality: N/A;  . ESOPHAGOGASTRODUODENOSCOPY (EGD) WITH PROPOFOL N/A 02/06/2017   Procedure: ESOPHAGOGASTRODUODENOSCOPY (EGD) WITH PROPOFOL;  Surgeon: Manya Silvas, MD;  Location: [facility] ENDOSCOPY;  Service: Endoscopy;  Laterality: N/A;  . ESOPHAGOGASTRODUODENOSCOPY (EGD) WITH PROPOFOL N/A 04/24/2017   Procedure: ESOPHAGOGASTRODUODENOSCOPY (EGD) WITH PROPOFOL;  Surgeon: Manya Silvas, MD;  Location: [facility] ENDOSCOPY;  Service: Endoscopy;  Laterality: N/A;  . ESOPHAGOGASTRODUODENOSCOPY (EGD) WITH PROPOFOL N/A 07/31/2017   Procedure: ESOPHAGOGASTRODUODENOSCOPY (EGD) WITH PROPOFOL;  Surgeon: Manya Silvas, MD;  Location: [facility] ENDOSCOPY;  Service: Endoscopy;  Laterality: N/A;  . ESOPHAGOGASTRODUODENOSCOPY (EGD) WITH PROPOFOL N/A 08/08/2018   Procedure: ESOPHAGOGASTRODUODENOSCOPY (EGD) WITH PROPOFOL;  Surgeon: Manya Silvas, MD;  Location: [facility] ENDOSCOPY;  Service: Endoscopy;  Laterality: N/A;  . ESOPHAGOGASTRODUODENOSCOPY (EGD) WITH PROPOFOL N/A 05/21/2020   Procedure: ESOPHAGOGASTRODUODENOSCOPY (EGD) WITH PROPOFOL;  Surgeon: [name] Manifold, MD;  Location: [facility] ENDOSCOPY;  Service: Endoscopy;  Laterality: N/A;  . ESOPHAGOGASTRODUODENOSCOPY (EGD) WITH PROPOFOL N/A 07/18/2020   Procedure: ESOPHAGOGASTRODUODENOSCOPY (EGD) WITH PROPOFOL;  Surgeon: Toledo, Benay Pike, MD;  Location: [facility] ENDOSCOPY;  Service: Gastroenterology;  Laterality: N/A;  . GIVENS CAPSULE STUDY N/A 12/05/2017   Procedure: GIVENS CAPSULE STUDY;  Surgeon: Manya Silvas,  MD;  Location: [facility] ENDOSCOPY;  Service: Endoscopy;  Laterality: N/A;  . TONSILLECTOMY    . TONSILLECTOMY AND ADENOIDECTOMY    . ULNAR NERVE TRANSPOSITION    . UVULOPALATOPHARYNGOPLASTY       reports that he has quit smoking. His smoking use included cigars and cigarettes. He quit after 20.00 years of use. He has never used smokeless tobacco. He reports that he does not drink alcohol and does not use drugs.  No Known Allergies  Family History  Problem Relation Age of Onset  . Diabetes Other   . Transient ischemic attack Father   . CAD Father      Prior to Admission medications   Medication Sig Start Date End Date Taking? Authorizing Provider  atorvastatin (LIPITOR) 10 MG tablet Take 20 mg by mouth every evening.  03/23/20   [provider]  carvedilol (COREG) 3.125 MG tablet Take 1 tablet (3.125 mg total) by mouth 2 (two) times daily with a meal. 07/30/20   Florencia Reasons, MD  ferrous sulfate 325 (65 FE) MG tablet Take 1 tablet (325 mg total) by mouth daily. This medication can cause black stools 06/11/20   Wyvonnia Dusky, MD  lactulose (CHRONULAC) 10 GM/15ML solution Take 90 mLs (60 g total) by mouth daily. Patient taking differently: Take 45 g by mouth 2 (two) times daily.  05/04/20   Ezekiel Slocumb, DO  LORazepam (ATIVAN) 0.5 MG tablet Take 1 tablet (0.5 mg total) by mouth 3 (three) times daily as needed for Anxiety    [provider]  metolazone (ZAROXOLYN) 5 MG tablet Take 5 mg by mouth daily. . 05/13/20   [provider]  NOVOLOG MIX 70/30 FLEXPEN (70-30) 100 UNIT/ML FlexPen 0-84 Units as directed. Inject 0 - 84 Units (sliding scale) Subcutaneously twice a day before meals, at breakfast and at dinner. 03/17/20   [provider]  pantoprazole (PROTONIX) 40 MG tablet Take 1 tablet (40 mg total) by mouth 2 (two) times daily. 03/06/19 07/25/20  Dustin Flock, MD  potassium chloride SA (KLOR-CON) 20 MEQ tablet Take 20 mEq by mouth 2 (two) times daily.  07/06/20   [provider]  rifaximin (XIFAXAN) 550 MG TABS tablet Take 1 tablet (550 mg total) by mouth 2 (two) times daily. 02/12/20   Loletha Grayer, MD  sodium chloride (OCEAN) 0.65 % SOLN nasal spray Place 1 spray into both nostrils as needed for congestion (nose irritation). 02/12/20   Loletha Grayer, MD  tamsulosin (FLOMAX) 0.4 MG CAPS capsule Take 1 capsule (0.4 mg total) by mouth daily after supper. 05/03/20   Ezekiel Slocumb, DO  torsemide (DEMADEX) 100 MG tablet Take 1 tablet (100 mg total) by mouth daily. 05/04/20   Ezekiel Slocumb, DO    Physical Exam: Vitals:   08/01/20 0626 08/01/20 0627 08/01/20 0900  BP: (!) 162/45  (!) 145/56  Pulse: 73  64  Resp: 20  18  Temp: 97.6 F (36.4 C)    TempSrc: Oral    SpO2: 99%  99%  Weight:  136 kg      Vitals:   08/01/20 0626 08/01/20 0627 08/01/20 0900  BP: (!) 162/45  (!) 145/56  Pulse: 73  64  Resp: 20  18  Temp: 97.6 F (36.4 C)    TempSrc: Oral    SpO2: 99%  99%  Weight:  136 kg     Constitutional: NAD, alert and oriented x 2. Appears comfortable and in no distress. Morbidly obese Eyes: PERRL, lids and conjunctivae  pallor ENMT: Mucous membranes are moist.  Neck: normal, supple, no masses, no thyromegaly Respiratory: Bilateral air entry, no wheezing, no crackles. Normal respiratory effort. No accessory muscle use.  Cardiovascular: Regular rate and rhythm, no murmurs / rubs / gallops. No extremity edema. 2+ pedal pulses. No carotid bruits.  Abdomen: no tenderness, no masses palpated. No hepatosplenomegaly. Bowel sounds positive.  Central adiposity Musculoskeletal: no clubbing / cyanosis. No joint deformity upper and lower extremities.  Skin: no rashes, lesions, ulcers.  Legs are wrapped Neurologic: No gross focal neurologic deficit.  Generalized weakness Psychiatric: Normal mood and affect.   Labs on Admission: I have personally reviewed following labs and imaging studies  CBC: Recent Labs  Lab  07/25/20 1344 07/25/20 1344 07/26/20 0040 07/26/20 1143 07/28/20 0435 07/29/20 0606 08/01/20 0635  WBC 5.5  --   --  5.4 4.0 4.7 3.7*  NEUTROABS  --   --   --   --  3.1  --  3.0  HGB 6.8*   < > 6.6* 7.4* 7.3* 7.4* 7.8*  HCT 20.7*   < > 20.8* 22.4* 22.7* 22.7* 24.2*  MCV 92.8  --   --  91.1 93.8 94.2 94.5  PLT 111*  --   --  97* 87* 84* 82*   < > = values in this interval not displayed.   Basic Metabolic Panel: Recent Labs  Lab 07/25/20 1344 07/26/20 0040 07/26/20 1143 07/27/20 0752 07/28/20 0435 07/29/20 0606 08/01/20 0635  NA   < >  --  135 136 139 143 143  K   < >  --  3.1* 3.8 4.0 3.9 3.9  CL   < >  --  100 102 104 107 109  CO2   < >  --  24 24 24 25 24   GLUCOSE   < >  --  225* 212* 174* 120* 187*  BUN   < >  --  105* 106* 100* 107* 92*  CREATININE   < >  --  3.28* 3.61* 3.33* 3.06* 2.67*  CALCIUM   < >  --  8.3* 8.2* 8.5* 8.9 8.5*  MG  --  2.1 2.2  --   --   --   --   PHOS  --   --  5.8*  --   --   --   --    < > = values in this interval not displayed.   GFR: Estimated Creatinine Clearance: 36.3 mL/min (A) (by C-G formula based on SCr of 2.67 mg/dL (H)). Liver Function Tests: Recent Labs  Lab 07/25/20 1344 07/26/20 1143 07/29/20 0606 08/01/20 0635  AST 35 30 24 30   ALT 25 23 19 20   ALKPHOS 80 69 74 78  BILITOT 0.8 1.0 1.4* 1.1  PROT 6.4* 5.9* 6.2* 6.4*  ALBUMIN 2.7* 2.5* 3.0* 2.9*   No results for input(s): LIPASE, AMYLASE in the last 168 hours. Recent Labs  Lab 07/29/20 0606 08/01/20 0841  AMMONIA 73* 109*   Coagulation Profile: Recent Labs  Lab 07/26/20 0040 07/26/20 1143 08/01/20 0841  INR 1.3* 1.4* 1.4*   Cardiac Enzymes: No results for input(s): CKTOTAL, CKMB, CKMBINDEX, TROPONINI in the last 168 hours. BNP (last 3 results) No results for input(s): PROBNP in the last 8760 hours. HbA1C: No results for input(s): HGBA1C in the last 72 hours. CBG: Recent Labs  Lab 07/29/20 1207 07/29/20 1719 07/29/20 1936 07/30/20 0808  07/30/20 1200  GLUCAP 129* 219* 218* 148* 136*   Lipid Profile: No results  for input(s): CHOL, HDL, LDLCALC, TRIG, CHOLHDL, LDLDIRECT in the last 72 hours. Thyroid Function Tests: No results for input(s): TSH, T4TOTAL, FREET4, T3FREE, THYROIDAB in the last 72 hours. Anemia Panel: No results for input(s): VITAMINB12, FOLATE, FERRITIN, TIBC, IRON, RETICCTPCT in the last 72 hours. Urine analysis:    Component Value Date/Time   COLORURINE YELLOW (A) 07/28/2020 1707   APPEARANCEUR HAZY (A) 07/28/2020 1707   APPEARANCEUR Clear 09/10/2014 0456   LABSPEC 1.016 07/28/2020 1707   LABSPEC 1.016 09/10/2014 0456   PHURINE 5.0 07/28/2020 1707   GLUCOSEU NEGATIVE 07/28/2020 1707   GLUCOSEU Negative 09/10/2014 0456   HGBUR NEGATIVE 07/28/2020 1707   BILIRUBINUR NEGATIVE 07/28/2020 1707   BILIRUBINUR Negative 09/10/2014 0456   KETONESUR NEGATIVE 07/28/2020 1707   PROTEINUR 100 (A) 07/28/2020 1707   NITRITE NEGATIVE 07/28/2020 1707   LEUKOCYTESUR NEGATIVE 07/28/2020 1707   LEUKOCYTESUR Negative 09/10/2014 0456    Radiological Exams on Admission: No results found.  EKG: Independently reviewed.   Assessment/Plan Principal Problem:   Hepatic encephalopathy (HCC) Active Problems:   Cirrhosis (HCC)   Gastroesophageal reflux disease   Chronic venous stasis dermatitis of both lower extremities   Obesity, Class III, BMI 40-49.9 (morbid obesity) (HCC)   Barrett's esophagus   Type 2 diabetes mellitus with stage 3 chronic kidney disease (HCC)   Anxiety   Anemia of chronic disease    Hepatic encephalopathy Appears to be secondary to medication noncompliance Patient is supposed to be on lactulose 45mg  BID which he has not been taking as prescribed Resume lactulose and titrate for 2 more soft stools Continue rifaximin   History of liver cirrhosis With complications of severe portal gastropathy/esophageal varices/pancytopenia Continue carvedilol and torsemide Maintain low-sodium  diet Continue on a proton pump inhibitor    Morbid obesity (BMI 40 - 28.3) Complicates overall prognosis and care    Anemia of chronic disease Hemoglobin is stable at 7.8g/dl    Anxiety disorder Continue as needed lorazepam    Diabetes mellitus with complications of stage III chronic kidney disease Sinus scale insulin for glycemic control Maintain consistent carbohydrate diet    DVT prophylaxis: SCD  Code Status: Full code Family Communication: Greater than 50% of time was spent discussing patient's condition and plan of care with him at the bedside.  All questions and concerns have been addressed.  He verbalizes understanding and agrees with the plan.  He wants his niece to make medical decisions for him if he is unable to Disposition Plan: Back to previous home environment Consults called: Physical therapy    Akili Cuda MD [facility]     08/01/2020, 9:51 AM

## 2020-08-01 NOTE — ED Notes (Signed)
Handoff report given to Caryl Pina, RN on 1C.

## 2020-08-01 NOTE — TOC Initial Note (Signed)
Transition of Care [facility]) - Initial/Assessment Note    Patient Details  Name: Corey West MRN: [id number] Date of Birth: March 05, 1951  Transition of Care [facility]) CM/SW Contact:    Victorino Dike, RN Phone Number: 08/01/2020, 10:12 AM  Clinical Narrative:                  Met with patient in room.  He reports living at home.  He reports he has had APS to his home concern for his self care.  Verified this with DSS.  Patient's DSS caseworker "DeeDee" [place], [place] [phone].    Patient had a plan and hired a nephew to help care for him in his home.  Currently patient is reporting that patient has been changing his medication dosages.  He also reports he is having to spending more money than money he is paying nephew.  He reports nephew does not come in until 1am most nights.  He also reports nephew is not in home most of the day.  Patient also reports nephew is has said he reports trying to commit suicide three times.  Patient reports he is afraid to go home in his nephews care.  He reports telling the nephew to return his truck to the home today and have his parents pick him up.  To leave the truck keys in the mailbox for a niece that will be picking the truck up later today.  Patient is concerned nephew may not return his truck.   Called Dss caseworker and left voicemail reporting this information and asked for a return call.  Also reported that patient would like to talk with them as well.    Expected Discharge Plan: [place] Barriers to Discharge: Continued Medical Work up   Patient Goals and CMS Choice Patient states their goals for this hospitalization and ongoing recovery are:: return to a safe home CMS Medicare.gov Compare Post Acute Care list provided to:: Patient Choice offered to / list presented to : Patient  Expected Discharge Plan and Services Expected Discharge Plan: [place]   Discharge Planning Services: CM Consult Post Acute  Care Choice: [place] arrangements for the past 2 months: Single Family Home                                      Prior Living Arrangements/Services Living arrangements for the past 2 months: Single Family Home Lives with:: Self (recently hired nephew to help care for him, this is not working out) Patient language and need for interpreter reviewed:: Yes        Need for Family Participation in Patient Care: Yes (Comment) Care giver support system in place?: No (comment) Current home services: DME, Home RN, Home PT Criminal Activity/Legal Involvement Pertinent to Current Situation/Hospitalization: No - Comment as needed  Activities of Daily Living      Permission Sought/Granted                  Emotional Assessment Appearance:: Appears stated age Attitude/Demeanor/Rapport: Crying Affect (typically observed): Appropriate, Anxious, Sad Orientation: : Oriented to Self, Oriented to Place, Oriented to  Time, Oriented to Situation Alcohol / Substance Use: Not Applicable Psych Involvement: No (comment)  Admission diagnosis:  Hepatic encephalopathy ([place]) [K72.90] Patient Active Problem List   Diagnosis Date Noted  . GI bleed 07/26/2020  . Acute on chronic anemia 07/25/2020  .  Generalized weakness 07/25/2020  . SIRS (systemic inflammatory response syndrome) ([place]) 07/07/2020  . Anemia   . Hypothermia   . Anemia of chronic disease 06/07/2020  . Prolonged QT interval 06/07/2020  . Hypoglycemia 06/07/2020  . Ascites 06/07/2020  . Portal hypertensive gastropathy ([place])   . Symptomatic anemia 05/19/2020  . Acute blood loss anemia 05/19/2020  . Acute GI bleeding 05/19/2020  . Bacteremia due to Klebsiella pneumoniae 04/26/2020  . Palliative care by specialist   . DNR (do not resuscitate) discussion   . COPD with chronic bronchitis ([place]) 04/23/2020  . Paroxysmal atrial fibrillation ([place]) 04/20/2020  . Cellulitis of left leg 02/23/2020  . Contusion of ribs,  left, initial encounter 02/23/2020  . Acute hypoxemic respiratory failure ([place]) 02/04/2020  . Volume overload 01/28/2020  . Hyperlipidemia associated with type 2 diabetes mellitus ([place]) 01/28/2020  . Liver cirrhosis ([place])   . Anxiety   . SOB (shortness of breath)   . Elevated troponin   . CKD (chronic kidney disease) stage 4, GFR 15-29 ml/min (HCC)   . Unstageable pressure ulcer of sacral region ([place]) 11/28/2019  . Chronic diastolic CHF (congestive heart failure) ([place]) 11/27/2019  . Type 2 diabetes mellitus with stage 3 chronic kidney disease ([place]) 11/27/2019  . Cellulitis of left lower extremity 11/27/2019  . Cellulitis of lower extremity 11/27/2019  . Cyst of pancreas 05/18/2019  . HCAP (healthcare-associated pneumonia) 02/03/2019  . Aphasia 01/14/2019  . Acute on chronic renal failure ([place]) 01/14/2019  . Malnutrition of moderate degree 12/03/2018  . C. difficile diarrhea 12/02/2018  . Ankle fracture, left, closed, initial encounter 10/15/2018  . Ankle fracture, right, closed, initial encounter 10/15/2018  . Ankle fracture 10/15/2018  . Pressure injury of skin 07/19/2018  . UTI (urinary tract infection) 07/18/2018  . Acute kidney injury superimposed on CKD ([place]) 01/02/2018  . 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 Goals of care, counseling/discussion   . Palliative care encounter   . Acute hepatic encephalopathy 12/16/2017  . Hepatic encephalopathy ([place]) 10/18/2017  . Hypokalemia 10/09/2017  . Hyperglycemia 10/09/2017  . Sepsis ([place]) 06/12/2017  . Type 2 diabetes mellitus with diabetic nephropathy, with long-term current use of insulin ([place]) 05/29/2017  . Long term (current) use of insulin ([place]) 05/29/2017  . Cellulitis in diabetic foot ([place]) 02/21/2017  . Cellulitis 02/21/2017  . Upper GI bleed/melena 07/20/2015  . Hematemesis 02/08/2015  . Cirrhosis ([place]) 02/08/2015  . Esophageal varices ([place]) 02/08/2015  . Gastroesophageal reflux disease 02/08/2015  .  Hyperlipidemia 02/08/2015  . Chronic venous stasis dermatitis of both lower extremities 10/06/2014  . Cutaneous abscess of limb, unspecified 09/17/2014  . Edema 05/25/2014  . Hypertension 05/25/2014  . Obesity, Class III, BMI 40-49.9 (morbid obesity) ([place] [place]) 05/25/2014  . Sleep apnea 05/25/2014  . Barrett's esophagus 05/25/2014  . Pancytopenia ([place]) 05/25/2014  . History of esophageal varices 05/25/2014  . Iron deficiency anemia 12/04/2013  . Thrombocytopenia ([place]) 12/04/2013  . Splenomegaly 11/04/2013   PCP:  Leonel Ramsay, MD Pharmacy:   [place] [place] [facility] [address] [place] [address] Phone: [phone] Fax: [phone]     Social Determinants of Health (SDOH) Interventions    Readmission Risk Interventions Readmission Risk Prevention Plan 07/27/2020 06/09/2020 02/02/2020  Transportation Screening Complete Complete Complete  PCP or Specialist Appt within 3-5 Days - - Complete  HRI or [place] - - Complete  Social Work Consult for [place] Planning/Counseling - - Complete  Palliative Care Screening - - Not Applicable  Medication Review (RN Care Manager) Complete Complete Complete  PCP or Specialist appointment within 3-5 days of discharge Complete Complete -  HRI or Home Care Consult Complete Complete -  SW Recovery Care/Counseling Consult Complete Complete -  SW Consult Not Complete Comments - - -  Palliative Care Screening Not Applicable Not Applicable -  Skilled Nursing Facility Complete Not Applicable -  Some recent data might be hidden

## 2020-08-02 DIAGNOSIS — K7290 Hepatic failure, unspecified without coma: Secondary | ICD-10-CM | POA: Diagnosis not present

## 2020-08-02 LAB — CBC
HCT: 23.6 % — ABNORMAL LOW (ref 39.0–52.0)
Hemoglobin: 7.4 g/dL — ABNORMAL LOW (ref 13.0–17.0)
MCH: 29.6 pg (ref 26.0–34.0)
MCHC: 31.4 g/dL (ref 30.0–36.0)
MCV: 94.4 fL (ref 80.0–100.0)
Platelets: 74 10*3/uL — ABNORMAL LOW (ref 150–400)
RBC: 2.5 MIL/uL — ABNORMAL LOW (ref 4.22–5.81)
RDW: 15.5 % (ref 11.5–15.5)
WBC: 2.4 10*3/uL — ABNORMAL LOW (ref 4.0–10.5)
nRBC: 0 % (ref 0.0–0.2)

## 2020-08-02 LAB — GLUCOSE, CAPILLARY
Glucose-Capillary: 179 mg/dL — ABNORMAL HIGH (ref 70–99)
Glucose-Capillary: 234 mg/dL — ABNORMAL HIGH (ref 70–99)
Glucose-Capillary: 187 mg/dL — ABNORMAL HIGH (ref 70–99)
Glucose-Capillary: 184 mg/dL — ABNORMAL HIGH (ref 70–99)

## 2020-08-02 LAB — BASIC METABOLIC PANEL
Anion gap: 10 (ref 5–15)
BUN: 84 mg/dL — ABNORMAL HIGH (ref 8–23)
CO2: 25 mmol/L (ref 22–32)
Calcium: 8.6 mg/dL — ABNORMAL LOW (ref 8.9–10.3)
Chloride: 108 mmol/L (ref 98–111)
Creatinine, Ser: 2.62 mg/dL — ABNORMAL HIGH (ref 0.61–1.24)
GFR, Estimated: 26 mL/min — ABNORMAL LOW (ref >60)
Glucose, Bld: 229 mg/dL — ABNORMAL HIGH (ref 70–99)
Potassium: 4.1 mmol/L (ref 3.5–5.1)
Sodium: 143 mmol/L (ref 135–145)

## 2020-08-02 MED ORDER — INSULIN ASPART 100 UNIT/ML ~~LOC~~ SOLN: 0.0000 [IU] | Freq: Every day | SUBCUTANEOUS | Status: DC

## 2020-08-02 MED ORDER — INSULIN ASPART 100 UNIT/ML ~~LOC~~ SOLN
0.0000 [IU] | Freq: Three times a day (TID) | SUBCUTANEOUS | Status: DC
  Administered 2020-08-02: 5 [IU] via SUBCUTANEOUS
  Administered 2020-08-02 – 2020-08-04 (×3): 3 [IU] via SUBCUTANEOUS
  Administered 2020-08-04: 08:00:00 2 [IU] via SUBCUTANEOUS
  Administered 2020-08-04 – 2020-08-06 (×4): 3 [IU] via SUBCUTANEOUS
  Administered 2020-08-06 (×2): 2 [IU] via SUBCUTANEOUS
  Administered 2020-08-07: 3 [IU] via SUBCUTANEOUS
  Administered 2020-08-08: 2 [IU] via SUBCUTANEOUS
  Filled 2020-08-02 (×11): qty 1

## 2020-08-02 MED ORDER — INSULIN GLARGINE 100 UNIT/ML ~~LOC~~ SOLN
10.0000 [IU] | Freq: Every day | SUBCUTANEOUS | Status: DC
  Administered 2020-08-02 – 2020-08-09 (×8): 10 [IU] via SUBCUTANEOUS
  Filled 2020-08-02 (×9): qty 0.1

## 2020-08-02 NOTE — Consult Note (Signed)
WOC Nurse Consult Note: Reason for Consult: LEft anterior lower leg with 2 openings 0.3 cm each  Wears twice weekly Unna boots.  Wound type: chronic venous insufficiency Pressure Injury POA: NA Measurement: see above Wound bed:red and moist Drainage (amount, consistency, odor) minimal serosanguinous  No odor.  Periwound:edema to bilateral lower legs.  Dressing procedure/placement/frequency: cleanse legs with soap and water and pat dry.  Apply aquacel Ag to open wounds.  Wrap with zinc layer and secure with self adherent coban.  Change twice weekly.   Will not follow at this time.  Please re-consult if needed.  Domenic Moras MSN, RN, FNP-BC CWON Wound, Ostomy, Continence Nurse Pager [phone]

## 2020-08-02 NOTE — Plan of Care (Signed)

## 2020-08-02 NOTE — Progress Notes (Signed)
PROGRESS NOTE    Corey West  WUJ:[id number] DOB: 01-Oct-1951 DOA: 08/01/2020 PCP: Leonel Ramsay, MD   Brief Narrative: Taken from H&P Corey West is a 69 y.o. 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  He had an upper endoscopy which showed severe portal hypertensive gastropathy as well as esophageal mucosal changes secondary to established Barrett's disease in the lower third of the esophagus.  Palliative care was recommended during his last hospitalization which he declined. He was brought in today by EMS for evaluation of confusion patient stated that his nephew who was supposed to be helping him at home is not taking care of him and is not administering his medications as prescribed. Patient is noted to have elevated ammonia level at 109. Multiple hospitalizations for similar situations, generalized weakness, concern of GI bleed, worsening renal function and anasarca.  Palliative care was declined initially and he was referred to outpatient palliative care.  Subjective: Patient has no new complaint when seen today.  Able to answer all orientation questions except year.  He was to see his nephrologist but unable to tell his name.  Assessment & Plan:   Principal Problem:   Hepatic encephalopathy (HCC) Active Problems:   Cirrhosis (HCC)   Gastroesophageal reflux disease   Chronic venous stasis dermatitis of both lower extremities   Obesity,  Class III, BMI 40-49.9 (morbid obesity) (HCC)   Barrett's esophagus   Type 2 diabetes mellitus with stage 3 chronic kidney disease (HCC)   Anxiety   Anemia of chronic disease  Hepatic encephalopathy.  Patient was not taking lactulose regularly stating that his nephew is not doing the job properly and not giving his medications on time.  Appears some improvement today as he was more alert and oriented, able to answer most of the orientation questions except year. -Continue lactulose and titrate for 2-3 soft bowel movements daily. -Continue rifaximin -Palliative care consult.  Liver cirrhosis.  Patient has an history of liver cirrhosis with complications of severe portal gastropathy/esophageal varices and pancytopenia. Appears volume up with 3+ lower extremity edema, stating that it is his baseline. -Continue with protein pump inhibitor. -Continue with lactulose and rifaximin.  Chronic HFpEF.  Echocardiogram with low normal EF done in July 2021 with grade 2 diastolic dysfunction.  3+ lower extremity edema with signs of venous stasis and some weeping. -Continue with carvedilol and torsemide. -Wound care was also consulted and patient was rewrapped with Unna boot.  CKD stage IV.  Creatinine with some improvement to 2.62 from recent hospitalization.  Patient was requesting nephrology consult. -Consult nephrology. -Continue to monitor -Avoid nephrotoxins  Anemia of chronic disease.  Baseline hemoglobin between 7-8.  Appears at baseline.  No recent bleed. -Continue to monitor.  Anxiety disorder. -Continue with as needed lorazepam  Morbid obesity. Body mass index is 44.28 kg/m.  -This will complicate overall prognosis.  Diabetes mellitus.  CBG elevated. -Add Lantus 10 units at bedtime along with sliding scale.  Objective: Vitals:   08/01/20 1730 08/01/20 2013 08/02/20 0020 08/02/20 0537  BP:  (!) 117/55 (!) 155/61 (!) 137/44  Pulse:  (!) 57 63 71  Resp:  18 18 18   Temp:  (!) 97.2  F (36.2 C) 97.8 F (36.6 C) (!) 97.4 F (36.3 C)  TempSrc:   Oral Oral  SpO2:  100% 100% 100%  Weight: 136 kg     Height: 5\' 9"  (1.753 m)       Intake/Output Summary (Last 24 hours) at 08/02/2020 0756 Last data filed at 08/02/2020 5621 Gross per 24 hour  Intake 0 ml  Output 1900 ml  Net -1900 ml   Filed Weights   08/01/20 0627 08/01/20 1730  Weight: 136 kg 136 kg    Examination:  General exam: Morbidly obese gentleman, appears calm and comfortable  Respiratory system: Clear to auscultation. Respiratory effort normal. Cardiovascular system: S1 & S2 heard, RRR.  Gastrointestinal system: Soft, nontender, nondistended, bowel sounds positive. Central nervous system: Alert and oriented. No focal neurological deficits. Extremities: 3+ LE edema, no cyanosis, pulses intact and symmetrical. Psychiatry: Judgement and insight appear normal.    DVT prophylaxis: SCDs Code Status: Full Family Communication: Discussed with patient. Disposition Plan:  Status is: Inpatient  Remains inpatient appropriate because:Inpatient level of care appropriate due to severity of illness   Dispo: The patient is from: Home              Anticipated d/c is to: Home              Anticipated d/c date is: 2 days              Patient currently is not medically stable to d/c.   Consultants:   Nephrology  Palliative care  Procedures:  Antimicrobials:   Data Reviewed: I have personally reviewed following labs and imaging studies  CBC: Recent Labs  Lab 07/26/20 1143 07/28/20 0435 07/29/20 0606 08/01/20 0635 08/02/20 0434  WBC 5.4 4.0 4.7 3.7* 2.4*  NEUTROABS  --  3.1  --  3.0  --   HGB 7.4* 7.3* 7.4* 7.8* 7.4*  HCT 22.4* 22.7* 22.7* 24.2* 23.6*  MCV 91.1 93.8 94.2 94.5 94.4  PLT 97* 87* 84* 82* 74*   Basic Metabolic Panel: Recent Labs  Lab 07/26/20 1143 07/26/20 1143 07/27/20 0752 07/28/20 0435 07/29/20 0606 08/01/20 0635 08/02/20 0434  NA 135   < > 136 139 143 143 143  K 3.1*    < > 3.8 4.0 3.9 3.9 4.1  CL 100   < > 102 104 107 109 108  CO2 24   < > 24 24 25 24 25   GLUCOSE 225*   < > 212* 174* 120* 187* 229*  BUN 105*   < > 106* 100* 107* 92* 84*  CREATININE 3.28*   < > 3.61* 3.33* 3.06* 2.67* 2.62*  CALCIUM 8.3*   < > 8.2* 8.5* 8.9 8.5* 8.6*  MG 2.2  --   --   --   --   --   --   PHOS 5.8*  --   --   --   --   --   --    < > = values in this interval not displayed.   GFR: Estimated Creatinine Clearance: 36.9 mL/min (A) (by C-G formula based on SCr of 2.62 mg/dL (H)). Liver Function Tests: Recent Labs  Lab 07/26/20 1143 07/29/20 0606 08/01/20 0635  AST 30 24 30   ALT 23 19 20   ALKPHOS 69 74 78  BILITOT 1.0 1.4* 1.1  PROT 5.9* 6.2* 6.4*  ALBUMIN 2.5* 3.0* 2.9*   No  results for input(s): LIPASE, AMYLASE in the last 168 hours. Recent Labs  Lab 07/29/20 0606 08/01/20 0841  AMMONIA 73* 109*   Coagulation Profile: Recent Labs  Lab 07/26/20 1143 08/01/20 0841  INR 1.4* 1.4*   Cardiac Enzymes: No results for input(s): CKTOTAL, CKMB, CKMBINDEX, TROPONINI in the last 168 hours. BNP (last 3 results) No results for input(s): PROBNP in the last 8760 hours. HbA1C: No results for input(s): HGBA1C in the last 72 hours. CBG: Recent Labs  Lab 07/29/20 1719 07/29/20 1936 07/30/20 0808 07/30/20 1200 08/01/20 1645  GLUCAP 219* 218* 148* 136* 137*   Lipid Profile: No results for input(s): CHOL, HDL, LDLCALC, TRIG, CHOLHDL, LDLDIRECT in the last 72 hours. Thyroid Function Tests: No results for input(s): TSH, T4TOTAL, FREET4, T3FREE, THYROIDAB in the last 72 hours. Anemia Panel: No results for input(s): VITAMINB12, FOLATE, FERRITIN, TIBC, IRON, RETICCTPCT in the last 72 hours. Sepsis Labs: No results for input(s): PROCALCITON, LATICACIDVEN in the last 168 hours.  Recent Results (from the past 240 hour(s))  Respiratory Panel by RT PCR (Flu A&B, Covid) - Nasopharyngeal Swab     Status: None   Collection Time: 07/25/20  6:22 PM   Specimen:  Nasopharyngeal Swab  Result Value Ref Range Status   SARS Coronavirus 2 by RT PCR NEGATIVE NEGATIVE Final    Comment: (NOTE) SARS-CoV-2 target nucleic acids are NOT DETECTED.  The SARS-CoV-2 RNA is generally detectable in upper respiratoy specimens during the acute phase of infection. The lowest concentration of SARS-CoV-2 viral copies this assay can detect is 131 copies/mL. A negative result does not preclude SARS-Cov-2 infection and should not be used as the sole basis for treatment or other patient management decisions. A negative result may occur with  improper specimen collection/handling, submission of specimen other than nasopharyngeal swab, presence of viral mutation(s) within the areas targeted by this assay, and inadequate number of viral copies (<131 copies/mL). A negative result must be combined with clinical observations, patient history, and epidemiological information. The expected result is Negative.  Fact Sheet for Patients:  PinkCheek.be  Fact Sheet for Healthcare Providers:  GravelBags.it  This test is no t yet approved or cleared by the [place] FDA and  has been authorized for detection and/or diagnosis of SARS-CoV-2 by FDA under an Emergency Use Authorization (EUA). This EUA will remain  in effect (meaning this test can be used) for the duration of the COVID-19 declaration under Section 564(b)(1) of the Act, 21 U.S.C. section 360bbb-3(b)(1), unless the authorization is terminated or revoked sooner.     Influenza A by PCR NEGATIVE NEGATIVE Final   Influenza B by PCR NEGATIVE NEGATIVE Final    Comment: (NOTE) The Xpert Xpress SARS-CoV-2/FLU/RSV assay is intended as an aid in  the diagnosis of influenza from Nasopharyngeal swab specimens and  should not be used as a sole basis for treatment. Nasal washings and  aspirates are unacceptable for Xpert Xpress SARS-CoV-2/FLU/RSV  testing.  Fact Sheet  for Patients: PinkCheek.be  Fact Sheet for Healthcare Providers: GravelBags.it  This test is not yet approved or cleared by the [place] FDA and  has been authorized for detection and/or diagnosis of SARS-CoV-2 by  FDA under an Emergency Use Authorization (EUA). This EUA will remain  in effect (meaning this test can be used) for the duration of the  Covid-19 declaration under Section 564(b)(1) of the Act, 21  U.S.C. section 360bbb-3(b)(1), unless the authorization is  terminated or revoked. Performed at [facility], [address] [place], [place] [address]   Urine Culture     Status: Abnormal   Collection Time: 07/29/20 10:46 AM   Specimen: Urine, Random  Result Value Ref Range Status   Specimen Description   Final    URINE, RANDOM Performed at [facility], [address], [place], [place] [address]    Special Requests   Final    NONE Performed at [facility], [address] [place], [place] [address]    Culture MULTIPLE SPECIES PRESENT, SUGGEST RECOLLECTION (A)  Final   Report Status 07/30/2020 FINAL  Final  Respiratory Panel by RT PCR (Flu A&B, Covid) - Nasopharyngeal Swab     Status: None   Collection Time: 08/01/20  9:23 AM   Specimen: Nasopharyngeal Swab  Result Value Ref Range Status   SARS Coronavirus 2 by RT PCR NEGATIVE NEGATIVE Final    Comment: (NOTE) SARS-CoV-2 target nucleic acids are NOT DETECTED.  The SARS-CoV-2 RNA is generally detectable in upper respiratoy specimens during the acute phase of infection. The lowest concentration of SARS-CoV-2 viral copies this assay can detect is 131 copies/mL. A negative result does not preclude SARS-Cov-2 infection and should not be used as the sole basis for treatment or other patient management decisions. A negative result may occur with  improper specimen collection/handling, submission of specimen other than  nasopharyngeal swab, presence of viral mutation(s) within the areas targeted by this assay, and inadequate number of viral copies (<131 copies/mL). A negative result must be combined with clinical observations, patient history, and epidemiological information. The expected result is Negative.  Fact Sheet for Patients:  PinkCheek.be  Fact Sheet for Healthcare Providers:  GravelBags.it  This test is no t yet approved or cleared by the [place] FDA and  has been authorized for detection and/or diagnosis of SARS-CoV-2 by FDA under an Emergency Use Authorization (EUA). This EUA will remain  in effect (meaning this test can be used) for the duration of the COVID-19 declaration under Section 564(b)(1) of the Act, 21 U.S.C. section 360bbb-3(b)(1), unless the authorization is terminated or revoked sooner.     Influenza A by PCR NEGATIVE NEGATIVE Final   Influenza B by PCR NEGATIVE NEGATIVE Final    Comment: (NOTE) The Xpert Xpress SARS-CoV-2/FLU/RSV assay is intended as an aid in  the diagnosis of influenza from Nasopharyngeal swab specimens and  should not be used as a sole basis for treatment. Nasal washings and  aspirates are unacceptable for Xpert Xpress SARS-CoV-2/FLU/RSV  testing.  Fact Sheet for Patients: PinkCheek.be  Fact Sheet for Healthcare Providers: GravelBags.it  This test is not yet approved or cleared by the [place] FDA and  has been authorized for detection and/or diagnosis of SARS-CoV-2 by  FDA under an Emergency Use Authorization (EUA). This EUA will remain  in effect (meaning this test can be used) for the duration of the  Covid-19 declaration under Section 564(b)(1) of the Act, 21  U.S.C. section 360bbb-3(b)(1), unless the authorization is  terminated or revoked. Performed at [facility], [address], [place], [place]  [address]      Radiology Studies: No results found.  Scheduled Meds: . atorvastatin  20 mg Oral QPM  . carvedilol  3.125 mg Oral BID WC  . ferrous sulfate  325 mg Oral Daily  . lactulose  30 g Oral Once  . lactulose  45 g Oral BID  . pantoprazole  40 mg Oral BID  . potassium chloride SA  20 mEq Oral BID  .  rifaximin  550 mg Oral BID  . sodium chloride flush  3 mL Intravenous Q12H  . tamsulosin  0.4 mg Oral QPC supper  . torsemide  100 mg Oral Daily   Continuous Infusions: . sodium chloride       LOS: 1 day   Time spent: 35 minutes.  Lorella Nimrod, MD [facility]  If 7PM-7AM, please contact night-coverage [email or web address]  08/02/2020, 7:56 AM   This record has been created using Systems analyst. Errors have been sought and corrected,but may not always be located. Such creation errors do not reflect on the standard of care.

## 2020-08-02 NOTE — Progress Notes (Signed)
PT Cancellation Note  Patient Details Name: Corey West MRN: [id number] DOB: Dec 29, 1950   Cancelled Treatment:    Reason Eval/Treat Not Completed: Other (comment) Attempted to see pt again after lunch.  We started some mobility and PT noticed he had a large stool under him when we sat up.  Called nursing to assist with clean up, attempted to see him yet again this afternoon and at that time he said he has having another BM and not done yet.  Reports he will call nursing for clean up when he is done, pt requests to hold PT until tomorrow AM.  Kreg Shropshire, DPT 08/02/2020, 2:25 PM

## 2020-08-02 NOTE — Progress Notes (Signed)
PT Cancellation Note  Patient Details Name: Corey West MRN: [id number] DOB: May 10, 1951   Cancelled Treatment:    Reason Eval/Treat Not Completed: Patient declined, no reason specified Attempted to see pt this AM, refuses stating he needs his meds and something to eat.  Indicates he would be willing to work with PT later today, will attempt as appropriate and time allows.  Kreg Shropshire, DPT 08/02/2020, 9:28 AM
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
## 2020-08-03 DIAGNOSIS — K7290 Hepatic failure, unspecified without coma: Secondary | ICD-10-CM | POA: Diagnosis not present

## 2020-08-03 LAB — VITAMIN B12: Vitamin B-12: 949 pg/mL — ABNORMAL HIGH (ref 180–914)

## 2020-08-03 LAB — CBC
HCT: 21.6 % — ABNORMAL LOW (ref 39.0–52.0)
HCT: 24 % — ABNORMAL LOW (ref 39.0–52.0)
Hemoglobin: 6.7 g/dL — ABNORMAL LOW (ref 13.0–17.0)
Hemoglobin: 7.7 g/dL — ABNORMAL LOW (ref 13.0–17.0)
MCH: 29.5 pg (ref 26.0–34.0)
MCH: 30.3 pg (ref 26.0–34.0)
MCHC: 31 g/dL (ref 30.0–36.0)
MCHC: 32.1 g/dL (ref 30.0–36.0)
MCV: 95.2 fL (ref 80.0–100.0)
MCV: 94.5 fL (ref 80.0–100.0)
Platelets: 63 10*3/uL — ABNORMAL LOW (ref 150–400)
Platelets: 70 10*3/uL — ABNORMAL LOW (ref 150–400)
RBC: 2.27 MIL/uL — ABNORMAL LOW (ref 4.22–5.81)
RBC: 2.54 MIL/uL — ABNORMAL LOW (ref 4.22–5.81)
RDW: 15.4 % (ref 11.5–15.5)
RDW: 15.6 % — ABNORMAL HIGH (ref 11.5–15.5)
WBC: 1.6 10*3/uL — ABNORMAL LOW (ref 4.0–10.5)
WBC: 1.8 10*3/uL — ABNORMAL LOW (ref 4.0–10.5)
nRBC: 0 % (ref 0.0–0.2)
nRBC: 0 % (ref 0.0–0.2)

## 2020-08-03 LAB — RENAL FUNCTION PANEL
Albumin: 2.5 g/dL — ABNORMAL LOW (ref 3.5–5.0)
Anion gap: 10 (ref 5–15)
BUN: 76 mg/dL — ABNORMAL HIGH (ref 8–23)
CO2: 26 mmol/L (ref 22–32)
Calcium: 8.4 mg/dL — ABNORMAL LOW (ref 8.9–10.3)
Chloride: 108 mmol/L (ref 98–111)
Creatinine, Ser: 2.57 mg/dL — ABNORMAL HIGH (ref 0.61–1.24)
GFR, Estimated: 26 mL/min — ABNORMAL LOW (ref >60)
Glucose, Bld: 127 mg/dL — ABNORMAL HIGH (ref 70–99)
Phosphorus: 3.6 mg/dL (ref 2.5–4.6)
Potassium: 3.7 mmol/L (ref 3.5–5.1)
Sodium: 144 mmol/L (ref 135–145)

## 2020-08-03 LAB — FERRITIN: Ferritin: 70 ng/mL (ref 24–336)

## 2020-08-03 LAB — GLUCOSE, CAPILLARY
Glucose-Capillary: 110 mg/dL — ABNORMAL HIGH (ref 70–99)
Glucose-Capillary: 197 mg/dL — ABNORMAL HIGH (ref 70–99)
Glucose-Capillary: 187 mg/dL — ABNORMAL HIGH (ref 70–99)
Glucose-Capillary: 180 mg/dL — ABNORMAL HIGH (ref 70–99)

## 2020-08-03 LAB — IRON AND TIBC
Iron: 57 ug/dL (ref 45–182)
Saturation Ratios: 20 % (ref 17.9–39.5)
TIBC: 291 ug/dL (ref 250–450)
UIBC: 234 ug/dL

## 2020-08-03 LAB — RETICULOCYTES
Immature Retic Fract: 7.2 % (ref 2.3–15.9)
RBC.: 2.57 MIL/uL — ABNORMAL LOW (ref 4.22–5.81)
Retic Count, Absolute: 44.5 10*3/uL (ref 19.0–186.0)
Retic Ct Pct: 1.7 % (ref 0.4–3.1)

## 2020-08-03 LAB — FOLATE: Folate: 13.3 ng/mL (ref >5.9)

## 2020-08-03 MED ORDER — DM-GUAIFENESIN ER 30-600 MG PO TB12
1.0000 | ORAL_TABLET | Freq: Two times a day (BID) | ORAL | Status: DC
  Administered 2020-08-03 – 2020-08-10 (×15): 1 via ORAL
  Filled 2020-08-03 (×15): qty 1
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
## 2020-08-03 NOTE — Evaluation (Addendum)
Physical Therapy Evaluation Patient Details Name: Corey West MRN: [id number] DOB: 14-Oct-1950 Today's Date: 08/03/2020   History of Present Illness  69 y.o. male with medical history significant  for liver cirrhosis complicated by severe portal hypertensive gastropathy, hepatic encephalopathy and esophageal varices, chronic diastolic heart failure, chronic lower extremity venous stasis , type 2 diabetes mellitus with complications of stage III chronic kidney disease, hypertension, gastric antral vascular ectasias, chronic multifactorial anemia  with baseline hemoglobin of 7-8, generalized anxiety disorder, BPH, who was recently discharged from [facility] on 07/30/2020 after he was evaluated for acute on chronic anemia.  Comes back with confusion and issues with managing meds at home.  Clinical Impression  Pt showed good effort with PT exam and was eager not only to do the activity but prove that he could.  However multiple bouts of loose stool t/o the session did hamper how much we could do and how far we could go.  Pt tolerated multiple bouts of clean up while standing quite well and did not really have any overt safety issues.  His vitals were stable with prolonged standing time (90 ft ambulation and many static standing bouts).  Pt needed only minimal assist with bed mobility but overall is clearly not appropriate to go home given his medical/bowel situation.    Follow Up Recommendations SNF    Equipment Recommendations  None recommended by PT    Recommendations for Other Services       Precautions / Restrictions Precautions Precautions: None Restrictions Weight Bearing Restrictions: No      Mobility  Bed Mobility Overal bed mobility: Needs Assistance Bed Mobility: Supine to Sit;Sit to Supine     Supine to sit: Min assist;Mod assist Sit to supine: Mod assist   General bed mobility comments: able to initiate getting to EOB, ultimately needs PT HHA and  support of torso    Transfers Overall transfer level: Modified independent Equipment used: Rolling walker (2 wheeled)             General transfer comment: Pt was able to rise to standing w/o direct assist, however he had issues with loose bowels on standing up, then we replugged the pure wick as he needed to pee but his anatomy did not allow it to work properly and he ended up making a mess on the floor.  He was able to maintain standing while we cleaned all this up and from a standing regard he did very will with tolerance and safety.  Ambulation/Gait Ambulation/Gait assistance: Modified independent (Device/Increase time) Gait Distance (Feet): 90 Feet Assistive device: Rolling walker (2 wheeled)       General Gait Details: Pt did well with all aspects of actual ambulation with no LOBs, consistent vitals and relatively good overall effort, however during ambulation he had frequent uncontrolled loose stool episodes (able to maintain standing well during these clean up episodes too).    Stairs            Wheelchair Mobility    Modified Rankin (Stroke Patients Only)       Balance Overall balance assessment: Modified Independent                                           Pertinent Vitals/Pain Pain Assessment: No/denies pain    Home Living Family/patient expects to be discharged to:: Private residence Living Arrangements: Alone Available  Help at Discharge: Personal care attendant (per pt not working out too well) Type of Home: House Home Access: Stairs to enter Entrance Stairs-Rails: Right Entrance Stairs-Number of Steps: 4 Home Layout: One [place]: Environmental consultant - 2 wheels;Cane - single point Additional Comments: bari walker    Prior Function Level of Independence: Independent with assistive device(s)         Comments: Mod independent with HH ambulation with a RW, w/c for MD apts, Ind with ADLs and tasks. Pt. reports taking sinkside  baths. Having increasing issues with meal preparation, medication management, and driving.     Hand Dominance        Extremity/Trunk Assessment   Upper Extremity Assessment Upper Extremity Assessment: Generalized weakness    Lower Extremity Assessment Lower Extremity Assessment: Generalized weakness       Communication   Communication: No difficulties  Cognition Arousal/Alertness: Awake/alert Behavior During Therapy: Anxious Overall Cognitive Status: Within Functional Limits for tasks assessed                                        General Comments      Exercises     Assessment/Plan    PT Assessment Patient needs continued PT services  PT Problem List Decreased strength;Decreased activity tolerance;Decreased mobility       PT Treatment Interventions Gait training;Stair training;Therapeutic activities;Therapeutic exercise;Balance training;Patient/family education;DME instruction;Functional mobility training    PT Goals (Current goals can be found in the Care Plan section)  Acute Rehab PT Goals Patient Stated Goal: to go home PT Goal Formulation: With patient Time For Goal Achievement: 08/17/20 Potential to Achieve Goals: Fair    Frequency Min 2X/week   Barriers to discharge Decreased caregiver support      Co-evaluation               AM-PAC PT "6 Clicks" Mobility  Outcome Measure Help needed turning from your back to your side while in a flat bed without using bedrails?: A Little Help needed moving from lying on your back to sitting on the side of a flat bed without using bedrails?: A Lot Help needed moving to and from a bed to a chair (including a wheelchair)?: A Little Help needed standing up from a chair using your arms (e.g., wheelchair or bedside chair)?: A Little Help needed to walk in hospital room?: A Little Help needed climbing 3-5 steps with a railing? : A Lot 6 Click Score: 16    End of Session Equipment Utilized  During Treatment: Gait belt Activity Tolerance: Patient tolerated treatment well Patient left: with bed alarm set;with nursing/sitter in room;with call bell/phone within reach Nurse Communication: Mobility status PT Visit Diagnosis: Muscle weakness (generalized) (M62.81);Difficulty in walking, not elsewhere classified (R26.2)    Time: 3546-5681 PT Time Calculation (min) (ACUTE ONLY): 35 min   Charges:   PT Evaluation $PT Eval Low Complexity: 1 Low PT Treatments $Gait Training: 8-22 mins        Kreg Shropshire, DPT 08/03/2020, 1:05 PM

## 2020-08-03 NOTE — Progress Notes (Signed)
PROGRESS NOTE    MOREY ANDONIAN  JJO:[id number] DOB: [birth year] DOA: 08/01/2020 PCP: Leonel Ramsay, MD   Brief Narrative: Taken from H&P RY MOODY is a 69 y.o. male with medical history significant  forliver cirrhosis complicated by severe portal hypertensive gastropathy, hepatic encephalopathy and esophageal varices, chronic diastolic heart failure, chronic lower extremity venous stasis , type 2 diabetes mellitus with complications of stage III chronic kidney disease, hypertension, gastric antral vascular ectasias, chronic multifactorial anemia  with baseline hemoglobin of 7-8, generalized anxiety disorder, BPH,who was recently discharged from [facility] on 10/30/[facility]21after he was evaluated for acute on chronic anemia.  He had an upper endoscopy which showed severe portal hypertensive gastropathy as well as esophageal mucosal changes secondary to established Barrett's disease in the lower third of the esophagus.  Palliative care was recommended during his last hospitalization which he declined. He was brought in today by EMS for evaluation of confusion patient stated that his nephew who was supposed to be helping him at home is not taking care of him and is not administering his medications as prescribed. Patient is noted to have elevated ammonia level at 109. Multiple hospitalizations for similar situations, generalized weakness, concern of GI bleed, worsening renal function and anasarca.  Palliative care was declined initially and he was referred to outpatient palliative care.  Subjective: Patient was quite disrespectful and rude when seen today.  Demanding to see his liver doctor and apply rectal tube.  Refusing to take more lactulose.  Had two bowel movements since morning. When tried to explain the importance of taking lactulose and titrating for 2-3 soft bowel movement he started asking that do you have an outpatient office and I said no, stated that you are lucky  otherwise I will come to your office and poop on your floor. Also demanding immediate transfer to [facility] for liver transplant.  Explained that we will be done by his gastroenterologist as an outpatient but he has to be compliant with his medications and lose some weight in order to qualify.  Assessment & Plan:   Principal Problem:   Hepatic encephalopathy (HCC) Active Problems:   Cirrhosis (HCC)   Gastroesophageal reflux disease   Chronic venous stasis dermatitis of both lower extremities   Obesity, Class III, BMI 40-49.9 (morbid obesity) (HCC)   Barrett's esophagus   Type 2 diabetes mellitus with stage 3 chronic kidney disease (HCC)   Anxiety   Anemia of chronic disease  Hepatic encephalopathy.  Patient was not taking lactulose regularly stating that his nephew is not doing the job properly and not giving his medications on time.  Patient was more alert but having very inappropriate demands. Seems like he was noncompliant with lactulose as he does not want to have bowel movements.  Demanding rectal tube to be placed if we want to continue lactulose.  Having multiple hospitalization with similar behavior and symptoms. -Rectal tube was placed at his demand although not clinically indicated. -Continue lactulose and titrate for 2-3 soft bowel movements daily. -Continue rifaximin -Palliative care consult.  Liver cirrhosis.  Patient has an history of liver cirrhosis with complications of severe portal gastropathy/esophageal varices and pancytopenia. Appears volume up with 3+ lower extremity edema, stating that it is his baseline. -Continue with protein pump inhibitor. -Continue with lactulose and rifaximin.  Chronic HFpEF.  Echocardiogram with low normal EF done in July 2021 with grade 2 diastolic dysfunction.  3+ lower extremity edema with signs of venous stasis and some weeping. -Continue  with carvedilol and torsemide. -Wound care was also consulted and patient was rewrapped with Unna  boot.  CKD stage IV.  Creatinine with some improvement to 2.57 from recent hospitalization.  Patient was requesting nephrology consult. -Consult nephrology. -Continue to monitor -Avoid nephrotoxins  Anemia of chronic disease.  Hemoglobin at 6.7 with morning labs, no obvious bleeding.  Repeat CBC with hemoglobin of 7.7.  Patient has mild pancytopenia most likely secondary to his liver disease.  Baseline hemoglobin between 7-8.   -Continue to monitor.  Anxiety disorder. -Continue with as needed lorazepam  Morbid obesity. Body mass index is 44.28 kg/m.  -This will complicate overall prognosis.  Diabetes mellitus.  CBG elevated. -Increase Lantus to 10 units twice daily. -Continue with sliding scale.  Objective: Vitals:   08/03/20 0007 08/03/20 0445 08/03/20 0842 08/03/20 1149  BP: (!) 134/55 (!) 141/46 (!) 159/54 (!) 145/44  Pulse: 62 (!) 59 66 63  Resp: 16 15 16 16   Temp: 98.2 F (36.8 C) 98.7 F (37.1 C) 98.6 F (37 C) 98.7 F (37.1 C)  TempSrc:   Oral Oral  SpO2: 98% 98% 98% 96%  Weight:      Height:        Intake/Output Summary (Last 24 hours) at 08/03/2020 1601 Last data filed at 08/03/2020 1434 Gross per 24 hour  Intake 360 ml  Output 1550 ml  Net -1190 ml   Filed Weights   08/01/20 0627 08/01/20 1730  Weight: 136 kg 136 kg    Examination:  General.  Morbidly obese gentleman, in no acute distress. Pulmonary.  Lungs clear bilaterally, normal respiratory effort. CV.  Regular rate and rhythm, no JVD, rub or murmur. Abdomen.  Soft, nontender, nondistended, BS positive. CNS.  Alert and oriented x3.  No focal neurologic deficit. Extremities.  2+ LE edema with Unna boots bilaterally,  Psychiatry.  Judgment and insight appears normal.  DVT prophylaxis: SCDs Code Status: Full Family Communication: Discussed with patient.  Open APS case, does not want to involve nephew. Disposition Plan:  Status is: Inpatient  Remains inpatient appropriate because:Inpatient  level of care appropriate due to severity of illness   Dispo: The patient is from: Home              Anticipated d/c is to: Home              Anticipated d/c date is: 2 days              Patient currently is not medically stable to d/c.   Consultants:   Nephrology  Palliative care  Procedures:  Antimicrobials:   Data Reviewed: I have personally reviewed following labs and imaging studies  CBC: Recent Labs  Lab 07/28/20 0435 07/28/20 0435 07/29/20 0606 08/01/20 0635 08/02/20 0434 08/03/20 0514 08/03/20 0832  WBC 4.0   < > 4.7 3.7* 2.4* 1.6* 1.8*  NEUTROABS 3.1  --   --  3.0  --   --   --   HGB 7.3*   < > 7.4* 7.8* 7.4* 6.7* 7.7*  HCT 22.7*   < > 22.7* 24.2* 23.6* 21.6* 24.0*  MCV 93.8   < > 94.2 94.5 94.4 95.2 94.5  PLT 87*   < > 84* 82* 74* 63* 70*   < > = values in this interval not displayed.   Basic Metabolic Panel: Recent Labs  Lab 07/28/20 0435 07/29/20 0606 08/01/20 0635 08/02/20 0434 08/03/20 0514  NA 139 143 143 143 144  K 4.0 3.9 3.9 4.1  3.7  CL 104 107 109 108 108  CO2 24 25 24 25 26   GLUCOSE 174* 120* 187* 229* 127*  BUN 100* 107* 92* 84* 76*  CREATININE 3.33* 3.06* 2.67* 2.62* 2.57*  CALCIUM 8.5* 8.9 8.5* 8.6* 8.4*  PHOS  --   --   --   --  3.6   GFR: Estimated Creatinine Clearance: 37.7 mL/min (A) (by C-G formula based on SCr of 2.57 mg/dL (H)). Liver Function Tests: Recent Labs  Lab 07/29/20 0606 08/01/20 0635 08/03/20 0514  AST 24 30  --   ALT 19 20  --   ALKPHOS 74 78  --   BILITOT 1.4* 1.1  --   PROT 6.2* 6.4*  --   ALBUMIN 3.0* 2.9* 2.5*   No results for input(s): LIPASE, AMYLASE in the last 168 hours. Recent Labs  Lab 07/29/20 0606 08/01/20 0841  AMMONIA 73* 109*   Coagulation Profile: Recent Labs  Lab 08/01/20 0841  INR 1.4*   Cardiac Enzymes: No results for input(s): CKTOTAL, CKMB, CKMBINDEX, TROPONINI in the last 168 hours. BNP (last 3 results) No results for input(s): PROBNP in the last 8760  hours. HbA1C: No results for input(s): HGBA1C in the last 72 hours. CBG: Recent Labs  Lab 08/02/20 1137 08/02/20 1709 08/02/20 2109 08/03/20 0838 08/03/20 1151  GLUCAP 234* 187* 184* 110* 197*   Lipid Profile: No results for input(s): CHOL, HDL, LDLCALC, TRIG, CHOLHDL, LDLDIRECT in the last 72 hours. Thyroid Function Tests: No results for input(s): TSH, T4TOTAL, FREET4, T3FREE, THYROIDAB in the last 72 hours. Anemia Panel: Recent Labs    08/03/20 0832  VITAMINB12 949*  FOLATE 13.3  FERRITIN 70  TIBC 291  IRON 57  RETICCTPCT 1.7   Sepsis Labs: No results for input(s): PROCALCITON, LATICACIDVEN in the last 168 hours.  Recent Results (from the past 240 hour(s))  Respiratory Panel by RT PCR (Flu A&B, Covid) - Nasopharyngeal Swab     Status: None   Collection Time: 07/25/20  6:22 PM   Specimen: Nasopharyngeal Swab  Result Value Ref Range Status   SARS Coronavirus 2 by RT PCR NEGATIVE NEGATIVE Final    Comment: (NOTE) SARS-CoV-2 target nucleic acids are NOT DETECTED.  The SARS-CoV-2 RNA is generally detectable in upper respiratoy specimens during the acute phase of infection. The lowest concentration of SARS-CoV-2 viral copies this assay can detect is 131 copies/mL. A negative result does not preclude SARS-Cov-2 infection and should not be used as the sole basis for treatment or other patient management decisions. A negative result may occur with  improper specimen collection/handling, submission of specimen other than nasopharyngeal swab, presence of viral mutation(s) within the areas targeted by this assay, and inadequate number of viral copies (<131 copies/mL). A negative result must be combined with clinical observations, patient history, and epidemiological information. The expected result is Negative.  Fact Sheet for Patients:  PinkCheek.be  Fact Sheet for Healthcare Providers:  GravelBags.it  This  test is no t yet approved or cleared by the [place] FDA and  has been authorized for detection and/or diagnosis of SARS-CoV-2 by FDA under an Emergency Use Authorization (EUA). This EUA will remain  in effect (meaning this test can be used) for the duration of the COVID-19 declaration under Section 564(b)(1) of the Act, 21 U.S.C. section 360bbb-3(b)(1), unless the authorization is terminated or revoked sooner.     Influenza A by PCR NEGATIVE NEGATIVE Final   Influenza B by PCR NEGATIVE NEGATIVE Final  Comment: (NOTE) The Xpert Xpress SARS-CoV-2/FLU/RSV assay is intended as an aid in  the diagnosis of influenza from Nasopharyngeal swab specimens and  should not be used as a sole basis for treatment. Nasal washings and  aspirates are unacceptable for Xpert Xpress SARS-CoV-2/FLU/RSV  testing.  Fact Sheet for Patients: PinkCheek.be  Fact Sheet for Healthcare Providers: GravelBags.it  This test is not yet approved or cleared by the [place] FDA and  has been authorized for detection and/or diagnosis of SARS-CoV-2 by  FDA under an Emergency Use Authorization (EUA). This EUA will remain  in effect (meaning this test can be used) for the duration of the  Covid-19 declaration under Section 564(b)(1) of the Act, 21  U.S.C. section 360bbb-3(b)(1), unless the authorization is  terminated or revoked. Performed at [facility], [address] [place], [place] [address]   Urine Culture     Status: Abnormal   Collection Time: 07/29/20 10:46 AM   Specimen: Urine, Random  Result Value Ref Range Status   Specimen Description   Final    URINE, RANDOM Performed at [facility], [address] [place], [place] [address]    Special Requests   Final    NONE Performed at [facility], [address] [place], [place] [address]    Culture MULTIPLE SPECIES PRESENT, SUGGEST RECOLLECTION (A)  Final    Report Status 07/30/2020 FINAL  Final  Respiratory Panel by RT PCR (Flu A&B, Covid) - Nasopharyngeal Swab     Status: None   Collection Time: 08/01/20  9:23 AM   Specimen: Nasopharyngeal Swab  Result Value Ref Range Status   SARS Coronavirus 2 by RT PCR NEGATIVE NEGATIVE Final    Comment: (NOTE) SARS-CoV-2 target nucleic acids are NOT DETECTED.  The SARS-CoV-2 RNA is generally detectable in upper respiratoy specimens during the acute phase of infection. The lowest concentration of SARS-CoV-2 viral copies this assay can detect is 131 copies/mL. A negative result does not preclude SARS-Cov-2 infection and should not be used as the sole basis for treatment or other patient management decisions. A negative result may occur with  improper specimen collection/handling, submission of specimen other than nasopharyngeal swab, presence of viral mutation(s) within the areas targeted by this assay, and inadequate number of viral copies (<131 copies/mL). A negative result must be combined with clinical observations, patient history, and epidemiological information. The expected result is Negative.  Fact Sheet for Patients:  PinkCheek.be  Fact Sheet for Healthcare Providers:  GravelBags.it  This test is no t yet approved or cleared by the [place] FDA and  has been authorized for detection and/or diagnosis of SARS-CoV-2 by FDA under an Emergency Use Authorization (EUA). This EUA will remain  in effect (meaning this test can be used) for the duration of the COVID-19 declaration under Section 564(b)(1) of the Act, 21 U.S.C. section 360bbb-3(b)(1), unless the authorization is terminated or revoked sooner.     Influenza A by PCR NEGATIVE NEGATIVE Final   Influenza B by PCR NEGATIVE NEGATIVE Final    Comment: (NOTE) The Xpert Xpress SARS-CoV-2/FLU/RSV assay is intended as an aid in  the diagnosis of influenza from Nasopharyngeal  swab specimens and  should not be used as a sole basis for treatment. Nasal washings and  aspirates are unacceptable for Xpert Xpress SARS-CoV-2/FLU/RSV  testing.  Fact Sheet for Patients: PinkCheek.be  Fact Sheet for Healthcare Providers: GravelBags.it  This test is not yet approved or cleared by the [place] and  has been authorized for detection and/or diagnosis of SARS-CoV-2 by  FDA under an Emergency Use Authorization (EUA). This EUA will remain  in effect (meaning this test can be used) for the duration of the  Covid-19 declaration under Section 564(b)(1) of the Act, 21  U.S.C. section 360bbb-3(b)(1), unless the authorization is  terminated or revoked. Performed at [facility], [address], [place], [place] [address]      Radiology Studies: No results found.  Scheduled Meds:  atorvastatin  20 mg Oral QPM   carvedilol  3.125 mg Oral BID WC   dextromethorphan-guaiFENesin  1 tablet Oral BID   ferrous sulfate  325 mg Oral Daily   insulin aspart  0-15 Units Subcutaneous TID WC   insulin aspart  0-5 Units Subcutaneous QHS   insulin glargine  10 Units Subcutaneous QHS   lactulose  30 g Oral Once   lactulose  45 g Oral BID   pantoprazole  40 mg Oral BID   potassium chloride SA  20 mEq Oral BID   rifaximin  550 mg Oral BID   sodium chloride flush  3 mL Intravenous Q12H   tamsulosin  0.4 mg Oral QPC supper   torsemide  100 mg Oral Daily   Continuous Infusions:  sodium chloride       LOS: 2 days   Time spent: 25 minutes.  Lorella Nimrod, MD [facility]  If 7PM-7AM, please contact night-coverage [email or web address]  08/03/2020, 4:01 PM   This record has been created using Systems analyst. Errors have been sought and corrected,but may not always be located. Such creation errors do not reflect on the standard of care.

## 2020-08-03 NOTE — Progress Notes (Signed)
Pt requesting to hold off on taking AM lactulose until after working with PT. PT messaged to help coordinate.

## 2020-08-03 NOTE — Progress Notes (Signed)
Lab at bedside to re-draw AM CBC

## 2020-08-03 NOTE — Progress Notes (Signed)
Rectal tube inserted per MD order without difficulty. Urine immediately noted to be present in rectal tube. MD made aware.

## 2020-08-04 DIAGNOSIS — Z7189 Other specified counseling: Secondary | ICD-10-CM

## 2020-08-04 DIAGNOSIS — Z515 Encounter for palliative care: Secondary | ICD-10-CM

## 2020-08-04 DIAGNOSIS — K7460 Unspecified cirrhosis of liver: Secondary | ICD-10-CM

## 2020-08-04 DIAGNOSIS — R188 Other ascites: Secondary | ICD-10-CM

## 2020-08-04 LAB — GLUCOSE, CAPILLARY
Glucose-Capillary: 132 mg/dL — ABNORMAL HIGH (ref 70–99)
Glucose-Capillary: 184 mg/dL — ABNORMAL HIGH (ref 70–99)
Glucose-Capillary: 160 mg/dL — ABNORMAL HIGH (ref 70–99)
Glucose-Capillary: 145 mg/dL — ABNORMAL HIGH (ref 70–99)

## 2020-08-04 LAB — CBC
HCT: 23.3 % — ABNORMAL LOW (ref 39.0–52.0)
Hemoglobin: 7.4 g/dL — ABNORMAL LOW (ref 13.0–17.0)
MCH: 29.8 pg (ref 26.0–34.0)
MCHC: 31.8 g/dL (ref 30.0–36.0)
MCV: 94 fL (ref 80.0–100.0)
Platelets: 63 10*3/uL — ABNORMAL LOW (ref 150–400)
RBC: 2.48 MIL/uL — ABNORMAL LOW (ref 4.22–5.81)
RDW: 15.5 % (ref 11.5–15.5)
WBC: 2.1 10*3/uL — ABNORMAL LOW (ref 4.0–10.5)
nRBC: 0 % (ref 0.0–0.2)

## 2020-08-04 LAB — RENAL FUNCTION PANEL
Albumin: 2.6 g/dL — ABNORMAL LOW (ref 3.5–5.0)
Anion gap: 9 (ref 5–15)
BUN: 72 mg/dL — ABNORMAL HIGH (ref 8–23)
CO2: 27 mmol/L (ref 22–32)
Calcium: 8.2 mg/dL — ABNORMAL LOW (ref 8.9–10.3)
Chloride: 104 mmol/L (ref 98–111)
Creatinine, Ser: 2.67 mg/dL — ABNORMAL HIGH (ref 0.61–1.24)
GFR, Estimated: 25 mL/min — ABNORMAL LOW (ref >60)
Glucose, Bld: 162 mg/dL — ABNORMAL HIGH (ref 70–99)
Phosphorus: 3.2 mg/dL (ref 2.5–4.6)
Potassium: 4.1 mmol/L (ref 3.5–5.1)
Sodium: 140 mmol/L (ref 135–145)

## 2020-08-04 LAB — PATHOLOGIST SMEAR REVIEW

## 2020-08-04 MED ORDER — LACTULOSE 10 GM/15ML PO SOLN
20.0000 g | Freq: Two times a day (BID) | ORAL | Status: DC
  Administered 2020-08-04 – 2020-08-05 (×2): 20 g via ORAL
  Filled 2020-08-04 (×2): qty 30

## 2020-08-04 NOTE — TOC Progression Note (Addendum)
Transition of Care [facility]) - Progression Note    Patient Details  Name: Corey West MRN: [id number] Date of Birth: [birth year]  Transition of Care [facility]) CM/SW [place], LCSW Phone Number: 08/04/2020, 1:24 PM  Clinical Narrative:   Spoke to patient after discussion with OT and MD in Progression Rounds. Per OT, recommendation may change to SNF. Patient reported he has been to [facility] in [place] for rehab before and would be agreeable to go there if recommended. Patient agreeable to continuing [place] with [facility] if SNF is not recommended, informed him we planned to add Aide and OT services (already has PT and RN). Updated MD and PT after discussion with patient. Will do SNF work up if SNF is recommended.  1:45- PT recommending SNF. CSW will complete SNF work up. Patient interested in ALF long term. CSW spoke with [employer] with APS (covering for [facility]), provided update and asked if they can assist with ALF placement when patient discharges from SNF rehab. She reported she will pass this information along to [facility] and will also encourage patient to ask SW at [facility] to help with ALF placement.    Expected Discharge Plan: [place] Barriers to Discharge: Continued Medical Work up  Expected Discharge Plan and Services Expected Discharge Plan: [place]   Discharge Planning Services: CM Consult Post Acute Care Choice: [place] arrangements for the past 2 months: Single Family Home                                       Social Determinants of Health (SDOH) Interventions    Readmission Risk Interventions Readmission Risk Prevention Plan 07/27/2020 06/09/2020 02/02/2020  Transportation Screening Complete Complete Complete  PCP or Specialist Appt within 3-5 Days - - Complete  HRI or [place] - - Complete  Social Work Consult for [place] Planning/Counseling - - Complete  Palliative Care Screening  - - Not Applicable  Medication Review Press photographer) Complete Complete Complete  PCP or Specialist appointment within 3-5 days of discharge Complete Complete -  [place] or Home Care Consult Complete Complete -  SW Recovery Care/Counseling Consult Complete Complete -  SW Consult Not Complete Comments - - -  Palliative Care Screening Not Applicable Not Applicable -  [place] Complete Not Applicable -  Some recent data might be hidden

## 2020-08-04 NOTE — Progress Notes (Addendum)
PROGRESS NOTE    TORRY ISTRE  JME:[id number] DOB: [birth year] DOA: 08/01/2020 PCP: Leonel Ramsay, MD   Brief Narrative: Taken from H&P Corey West is a 69 y.o. 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  He had an upper endoscopy which showed severe portal hypertensive gastropathy as well as esophageal mucosal changes secondary to established Barrett's disease in the lower third of the esophagus.  Palliative care was recommended during his last hospitalization which he declined. He was brought in today by EMS for evaluation of confusion patient stated that his nephew who was supposed to be helping him at home is not taking care of him and is not administering his medications as prescribed. Patient is noted to have elevated ammonia level at 109. Multiple hospitalizations for similar situations, generalized weakness, concern of GI bleed, worsening renal function and anasarca.  Palliative care was declined initially and he was referred to outpatient palliative care. Refusing to take lactulose without rectal tube which was placed due to his demand as he was becoming very nasty when trying to explain risk and benefits of rectal tube and why he does not need it. Also demanding to see his GI and nephrologist stating that you cannot take care of me because you are not a specialist-asked Dr. Allen Norris and Dr. Juleen China to see him although there was not emergent need.  They  both gracefully agreed with this consult.  Subjective: Patient was again very upset with me this morning stating that I ordered removal of rectal tube.  And I told that I am not even aware that rectal tube is not in as we placed it yesterday he was even more upset..  The rectal tube came out overnight and was not placed back as his anal area seems quite raw.  He was also complaining that he is having bleeding as rectal tube was not placed properly.  Tried explaining to avoid rectal tube to prevent more injury but he becomes more nasty.  He was also demanding to see GI and nephrology stating that you are not a specialist so you cannot take care of me. He was also demanding to move him to [facility] for emergent liver transplant and does not want to listen to any explanation.  Assessment & Plan:   Principal Problem:   Hepatic encephalopathy (HCC) Active Problems:   Cirrhosis (HCC)   Gastroesophageal reflux disease   Chronic venous stasis dermatitis of both lower extremities   Obesity, Class III, BMI 40-49.9 (morbid obesity) (HCC)   Barrett's esophagus   Type 2 diabetes mellitus with stage 3 chronic kidney disease (HCC)   Anxiety   Anemia of chronic disease  Hepatic encephalopathy.  Resolved.  Patient was not taking lactulose regularly stating that his nephew is not doing the job properly and not giving his medications on time.  Patient is alert and oriented but having very inappropriate demands. Seems like he was noncompliant with lactulose as he does not want to have bowel movements. Having multiple hospitalization with similar behavior and symptoms. -Rectal tube was replaced at his demand although not clinically  indicated and might cause more damage to his raw anal area. -Decrease the dose of lactulose to 20 milligrams twice daily for his complaint of having diarrhea.  Told nursing staff to titrate for 2-3 soft bowel movements and back of he continued to have diarrhea. -Continue  rifaximin -Palliative care consult. -GI consult-Dr.Wohl gracefully accepted this unreasonable consult, really appreciate his help.  Liver cirrhosis.  Patient has an history of liver cirrhosis with complications of severe portal gastropathy/esophageal varices and pancytopenia. Appears volume up with 3+ lower extremity edema, stating that it is his baseline. -Continue with protein pump inhibitor. -Continue with lactulose and rifaximin.  Chronic HFpEF.  Echocardiogram with low normal EF done in July 2021 with grade 2 diastolic dysfunction.  3+ lower extremity edema with signs of venous stasis and some weeping. -Continue with carvedilol and torsemide. -Wound care was also consulted and patient was rewrapped with Unna boot.  CKD stage IV.  Creatinine with some improvement to 2.57 from recent hospitalization.  Patient was requesting nephrology consult. -Consult nephrology. -Continue to monitor -Avoid nephrotoxins  Anemia of chronic disease.  Patient has mild pancytopenia most likely secondary to his liver disease.  Hemoglobin at 7.4 today.  Baseline hemoglobin between 7-8.   -Continue to monitor.  Anxiety disorder. -Continue with as needed lorazepam  Morbid obesity. Body mass index is 44.28 kg/m.  -This will complicate overall prognosis.  Diabetes mellitus.  Postprandial elevation of blood glucose levels. -Continue Lantus to 10 units twice daily. -Continue with sliding scale. -Add 3 units with meals.  Objective: Vitals:   08/04/20 0035 08/04/20 0440 08/04/20 0724 08/04/20 1126  BP: (!) 137/43 (!) 142/56 (!) 155/62 (!) 157/50  Pulse: (!) 56 (!) 57 (!) 57 (!) 59  Resp: 17 16 18 18   Temp: 98.4 F (36.9 C) 97.8 F (36.6 C) 98.6 F (37 C) 98.1 F (36.7 C)  TempSrc:  Oral Oral   SpO2: 98%  97% 99%  Weight:      Height:        Intake/Output Summary (Last 24 hours) at 08/04/2020 1540 Last data filed at 08/04/2020 1419 Gross per 24 hour  Intake 600 ml  Output --  Net 600 ml    Filed Weights   08/01/20 0627 08/01/20 1730  Weight: 136 kg 136 kg    Examination:  General.  Morbidly obese, and unpleasant gentleman in no acute distress. Pulmonary.  Lungs clear bilaterally, normal respiratory effort. CV.  Regular rate and rhythm, no JVD, rub or murmur. Abdomen.  Soft, nontender, nondistended, BS positive. CNS.  Alert and oriented x3.  No focal neurologic deficit. Extremities.  2+ LE edema, bilateral Unna boot in place. Psychiatry.  Judgment and insight appears normal.  DVT prophylaxis: SCDs Code Status: Full Family Communication: Discussed with patient.  Open APS case, does not want to involve nephew. Disposition Plan:  Status is: Inpatient  Remains inpatient appropriate because:Inpatient level of care appropriate due to severity of illness   Dispo: The patient is from: Home              Anticipated d/c is to: SNF              Anticipated d/c date is: 2 days              Patient currently is medically stable.  High risk for readmission because of being noncompliant and behavioral issues.  OT recommended SNF placement.  TOC working on it.   Consultants:   Nephrology  Palliative care  GI  Procedures:  Antimicrobials:   Data Reviewed: I have personally reviewed following labs and imaging studies  CBC: Recent Labs  Lab 08/01/20 0635 08/02/20 0434 08/03/20 0514 08/03/20 0832 08/04/20 0420  WBC 3.7* 2.4* 1.6* 1.8* 2.1*  NEUTROABS 3.0  --   --   --   --   HGB 7.8* 7.4* 6.7* 7.7* 7.4*  HCT 24.2* 23.6* 21.6* 24.0* 23.3*  MCV 94.5 94.4 95.2 94.5 94.0  PLT 82* 74* 63* 70* 63*   Basic Metabolic Panel: Recent Labs  Lab 07/29/20 0606 08/01/20 0635 08/02/20 0434 08/03/20 0514 08/04/20 0420  NA 143 143 143 144 140  K 3.9 3.9 4.1 3.7 4.1  CL 107 109 108 108 104  CO2 25 24 25 26 27   GLUCOSE 120* 187* 229* 127* 162*  BUN 107* 92* 84* 76* 72*  CREATININE 3.06* 2.67* 2.62* 2.57* 2.67*  CALCIUM 8.9 8.5* 8.6* 8.4* 8.2*  PHOS  --   --   --   3.6 3.2   GFR: Estimated Creatinine Clearance: 36.3 mL/min (A) (by C-G formula based on SCr of 2.67 mg/dL (H)). Liver Function Tests: Recent Labs  Lab 07/29/20 0606 08/01/20 0635 08/03/20 0514 08/04/20 0420  AST 24 30  --   --   ALT 19 20  --   --   ALKPHOS 74 78  --   --   BILITOT 1.4* 1.1  --   --   PROT 6.2* 6.4*  --   --   ALBUMIN 3.0* 2.9* 2.5* 2.6*   No results for input(s): LIPASE, AMYLASE in the last 168 hours. Recent Labs  Lab 07/29/20 0606 08/01/20 0841  AMMONIA 73* 109*   Coagulation Profile: Recent Labs  Lab 08/01/20 0841  INR 1.4*   Cardiac Enzymes: No results for input(s): CKTOTAL, CKMB, CKMBINDEX, TROPONINI in the last 168 hours. BNP (last 3 results) No results for input(s): PROBNP in the last 8760 hours. HbA1C: No results for input(s): HGBA1C in the last 72 hours. CBG: Recent Labs  Lab 08/03/20 1151 08/03/20 1726 08/03/20 2212 08/04/20 0743 08/04/20 1125  GLUCAP 197* 187* 180* 132* 184*   Lipid Profile: No results for input(s): CHOL, HDL, LDLCALC, TRIG, CHOLHDL, LDLDIRECT in the last 72 hours. Thyroid Function Tests: No results for input(s): TSH, T4TOTAL, FREET4, T3FREE, THYROIDAB in the last 72 hours. Anemia Panel: Recent Labs    08/03/20 0832  VITAMINB12 949*  FOLATE 13.3  FERRITIN 70  TIBC 291  IRON 57  RETICCTPCT 1.7   Sepsis Labs: No results for input(s): PROCALCITON, LATICACIDVEN in the last 168 hours.  Recent Results (from the past 240 hour(s))  Respiratory Panel by RT PCR (Flu A&B, Covid) - Nasopharyngeal Swab     Status: None   Collection Time: 07/25/20  6:22 PM   Specimen: Nasopharyngeal Swab  Result Value Ref Range Status   SARS Coronavirus 2 by RT PCR NEGATIVE NEGATIVE Final    Comment: (NOTE) SARS-CoV-2 target nucleic acids are NOT DETECTED.  The SARS-CoV-2 RNA is generally detectable in upper respiratoy specimens during the acute phase of infection. The lowest concentration of SARS-CoV-2 viral copies this assay  can detect is 131 copies/mL. A negative result does not preclude SARS-Cov-2 infection and should not be used as the sole basis for treatment or other patient management decisions. A negative result may occur with  improper specimen collection/handling, submission of specimen other than nasopharyngeal swab, presence of viral mutation(s) within the areas targeted by this assay, and inadequate number of viral  copies (<131 copies/mL). A negative result must be combined with clinical observations, patient history, and epidemiological information. The expected result is Negative.  Fact Sheet for Patients:  PinkCheek.be  Fact Sheet for Healthcare Providers:  GravelBags.it  This test is no t yet approved or cleared by the [place] FDA and  has been authorized for detection and/or diagnosis of SARS-CoV-2 by FDA under an Emergency Use Authorization (EUA). This EUA will remain  in effect (meaning this test can be used) for the duration of the COVID-19 declaration under Section 564(b)(1) of the Act, 21 U.S.C. section 360bbb-3(b)(1), unless the authorization is terminated or revoked sooner.     Influenza A by PCR NEGATIVE NEGATIVE Final   Influenza B by PCR NEGATIVE NEGATIVE Final    Comment: (NOTE) The Xpert Xpress SARS-CoV-2/FLU/RSV assay is intended as an aid in  the diagnosis of influenza from Nasopharyngeal swab specimens and  should not be used as a sole basis for treatment. Nasal washings and  aspirates are unacceptable for Xpert Xpress SARS-CoV-2/FLU/RSV  testing.  Fact Sheet for Patients: PinkCheek.be  Fact Sheet for Healthcare Providers: GravelBags.it  This test is not yet approved or cleared by the [place] FDA and  has been authorized for detection and/or diagnosis of SARS-CoV-2 by  FDA under an Emergency Use Authorization (EUA). This EUA will remain   in effect (meaning this test can be used) for the duration of the  Covid-19 declaration under Section 564(b)(1) of the Act, 21  U.S.C. section 360bbb-3(b)(1), unless the authorization is  terminated or revoked. Performed at [facility], [address] [place], [place] [address]   Urine Culture     Status: Abnormal   Collection Time: 07/29/20 10:46 AM   Specimen: Urine, Random  Result Value Ref Range Status   Specimen Description   Final    URINE, RANDOM Performed at [facility], [address], [place], [place] [address]    Special Requests   Final    NONE Performed at [facility], [address] [place], [place] [address]    Culture MULTIPLE SPECIES PRESENT, SUGGEST RECOLLECTION (A)  Final   Report Status 07/30/2020 FINAL  Final  Respiratory Panel by RT PCR (Flu A&B, Covid) - Nasopharyngeal Swab     Status: None   Collection Time: 08/01/20  9:23 AM   Specimen: Nasopharyngeal Swab  Result Value Ref Range Status   SARS Coronavirus 2 by RT PCR NEGATIVE NEGATIVE Final    Comment: (NOTE) SARS-CoV-2 target nucleic acids are NOT DETECTED.  The SARS-CoV-2 RNA is generally detectable in upper respiratoy specimens during the acute phase of infection. The lowest concentration of SARS-CoV-2 viral copies this assay can detect is 131 copies/mL. A negative result does not preclude SARS-Cov-2 infection and should not be used as the sole basis for treatment or other patient management decisions. A negative result may occur with  improper specimen collection/handling, submission of specimen other than nasopharyngeal swab, presence of viral mutation(s) within the areas targeted by this assay, and inadequate number of viral copies (<131 copies/mL). A negative result must be combined with clinical observations, patient history, and epidemiological information. The expected result is Negative.  Fact Sheet for Patients:   PinkCheek.be  Fact Sheet for Healthcare Providers:  GravelBags.it  This test is no t yet approved or cleared by the [place] FDA and  has been authorized for detection and/or diagnosis of SARS-CoV-2 by FDA under an Emergency Use Authorization (EUA). This EUA will remain  in effect (meaning this test can be used) for the duration of the COVID-19 declaration under Section 564(b)(1) of the Act, 21 U.S.C. section 360bbb-3(b)(1), unless the authorization is terminated or revoked sooner.     Influenza A by PCR NEGATIVE NEGATIVE Final   Influenza B by PCR NEGATIVE NEGATIVE Final    Comment: (NOTE) The Xpert Xpress SARS-CoV-2/FLU/RSV assay is intended as an aid in  the diagnosis of influenza from Nasopharyngeal swab specimens and  should not be used as a sole basis for treatment. Nasal washings and  aspirates are unacceptable for Xpert Xpress SARS-CoV-2/FLU/RSV  testing.  Fact Sheet for Patients: PinkCheek.be  Fact Sheet for Healthcare Providers: GravelBags.it  This test is not yet approved or cleared by the [place] FDA and  has been authorized for detection and/or diagnosis of SARS-CoV-2 by  FDA under an Emergency Use Authorization (EUA). This EUA will remain  in effect (meaning this test can be used) for the duration of the  Covid-19 declaration under Section 564(b)(1) of the Act, 21  U.S.C. section 360bbb-3(b)(1), unless the authorization is  terminated or revoked. Performed at [facility], [address], [place], [place] [address]      Radiology Studies: No results found.  Scheduled Meds: . atorvastatin  20 mg Oral QPM  . carvedilol  3.125 mg Oral BID WC  . dextromethorphan-guaiFENesin  1 tablet Oral BID  . ferrous sulfate  325 mg Oral Daily  . insulin aspart  0-15 Units Subcutaneous TID WC  . insulin aspart  0-5 Units Subcutaneous  QHS  . insulin glargine  10 Units Subcutaneous QHS  . lactulose  20 g Oral BID  . pantoprazole  40 mg Oral BID  . potassium chloride SA  20 mEq Oral BID  . rifaximin  550 mg Oral BID  . sodium chloride flush  3 mL Intravenous Q12H  . tamsulosin  0.4 mg Oral QPC supper  . torsemide  100 mg Oral Daily   Continuous Infusions: . sodium chloride       LOS: 3 days   Time spent: 30 minutes.  Lorella Nimrod, MD [facility]  If 7PM-7AM, please contact night-coverage [email or web address]  08/04/2020, 3:40 PM   This record has been created using Systems analyst. Errors have been sought and corrected,but may not always be located. Such creation errors do not reflect on the standard of care.

## 2020-08-04 NOTE — Care Management Important Message (Signed)
Important Message  Patient Details  Name: Corey West MRN: [id number] Date of Birth: 09/08/1951   Medicare Important Message Given:  Yes     [name] 08/04/2020, 10:34 AM

## 2020-08-04 NOTE — Consult Note (Signed)
Lucilla Lame, MD [facility]  [address], [place], [place] [address] Phone: [phone] Fax : [phone]  Consultation  Referring Provider:     Dr. Reesa Chew Primary Care Physician:  Leonel Ramsay, MD Primary Gastroenterologist:  Dr. [name]         Reason for Consultation:     Cirrhosis  Date of Admission:  08/01/2020 Date of Consultation:  08/04/2020         HPI:   BASHEER West is a 69 y.o. male who is followed by Dr. [name] and has been seen by multiple gastroenterologist while in the hospital.  He was even seen by me back in 2015.  The patient has a history of nonalcoholic cirrhosis.  This was presumed to be due to fatty liver and diabetes.  The patient has had multiple admissions to the hospital for hepatic encephalopathy and complications of his cirrhosis.  The patient had 2 upper endoscopies in December 1 by Dr. [name] in 1 by Dr. Haig Prophet.  The patient went home for 1 day on October 30 and came back on July 1st.  The patient has not been evaluated for a liver transplant in the past because of his obesity and chronic renal disease.  The patient reports that his main concern now is that he is having diarrhea.  He states that he has been having diarrhea since he was readmitted this time.  The patient does have a history in the remote past having C. difficile colitis.  The patient was admitted with confusion on this admission.  The patient is now laying in bed with out any confusion or distress.  The patient endorses diarrhea 3-5 times a day and states that he has a loose bowel movement every time he moves around in bed.  The patient now has a rectal tube in place due to his diarrhea.  He has also been reluctant to take his lactulose without the rectal tube being placed due to the presumed starting of his diarrhea. The patient denies any abdominal pain at the present time.  He also states that his last admission at the end of October was not complicated with the diarrhea is presently  having.  The patient has been on Xifaxan in the past and reports that he is still taking that.  Past Medical History:  Diagnosis Date  . A-fib ([place])   . Anemia   . Anxiety    controlled;   . Arthritis   . AVM (arteriovenous malformation) of stomach, acquired with hemorrhage   . Barrett's esophagus   . CHF (congestive heart failure) ([place])   . Chronic kidney disease    renal infufficiency  . Cirrhosis ([place])   . COPD (chronic obstructive pulmonary disease) ([place])   . Depression    controlled;   Marland Kitchen Diabetes mellitus without complication ([place])    not controlled, taking insulin but sugar continues to run high;   Marland Kitchen Dysrhythmia   . Edema   . Esophageal varices ([place])   . GAVE (gastric antral vascular ectasia)   . GERD (gastroesophageal reflux disease)   . Hepatic encephalopathy ([place])   . History of hiatal hernia   . Hyperlipidemia   . Hypertension    controlled well;   . IPMN (intraductal papillary mucinous neoplasm)   . Liver cirrhosis ([place])   . Nephropathy, diabetic ([place])   . Obesity   . Pancytopenia ([place])   . Polyp, stomach    with chronic blood loss  . Portal hypertensive  gastropathy ([place])   . Sleep apnea    does not wear a cpap, Medicare would not pay for it   . Venous stasis dermatitis of both lower extremities   . Venous stasis of both lower extremities    with cellulitis    Past Surgical History:  Procedure Laterality Date  . ESOPHAGOGASTRODUODENOSCOPY N/A 02/09/2015   Procedure: ESOPHAGOGASTRODUODENOSCOPY (EGD);  Surgeon: Manya Silvas, MD;  Location[facility] ENDOSCOPY;  Service: Endoscopy;  Laterality: N/A;  . ESOPHAGOGASTRODUODENOSCOPY N/A 07/22/2015   Procedure: ESOPHAGOGASTRODUODENOSCOPY (EGD);  Surgeon: Manya Silvas, MD;  Location: [facility] ENDOSCOPY;  Service: Endoscopy;  Laterality: N/A;  . ESOPHAGOGASTRODUODENOSCOPY N/A 06/26/2017   Procedure: ESOPHAGOGASTRODUODENOSCOPY (EGD);  Surgeon: Manya Silvas, MD;  Location: [facility] ENDOSCOPY;  Service: Endoscopy;   Laterality: N/A;  . ESOPHAGOGASTRODUODENOSCOPY N/A 06/27/2017   Procedure: ESOPHAGOGASTRODUODENOSCOPY (EGD);  Surgeon: Manya Silvas, MD;  Location: [facility] ENDOSCOPY;  Service: Endoscopy;  Laterality: N/A;  . ESOPHAGOGASTRODUODENOSCOPY N/A 06/28/2017   Procedure: ESOPHAGOGASTRODUODENOSCOPY (EGD);  Surgeon: Manya Silvas, MD;  Location: [facility] ENDOSCOPY;  Service: Endoscopy;  Laterality: N/A;  . ESOPHAGOGASTRODUODENOSCOPY Bilateral 10/11/2017   Procedure: ESOPHAGOGASTRODUODENOSCOPY (EGD);  Surgeon: Manya Silvas, MD;  Location: [facility] ENDOSCOPY;  Service: Endoscopy;  Laterality: Bilateral;  . ESOPHAGOGASTRODUODENOSCOPY N/A 12/04/2017   Procedure: ESOPHAGOGASTRODUODENOSCOPY (EGD);  Surgeon: Manya Silvas, MD;  Location: [facility] ENDOSCOPY;  Service: Endoscopy;  Laterality: N/A;  . ESOPHAGOGASTRODUODENOSCOPY N/A 03/06/2019   Procedure: ESOPHAGOGASTRODUODENOSCOPY (EGD);  Surgeon: Toledo, Benay Pike, MD;  Location: [facility] ENDOSCOPY;  Service: Gastroenterology;  Laterality: N/A;  . ESOPHAGOGASTRODUODENOSCOPY (EGD) WITH PROPOFOL N/A 07/20/2015   Procedure: ESOPHAGOGASTRODUODENOSCOPY (EGD) WITH PROPOFOL;  Surgeon: Manya Silvas, MD;  Location: [facility] ENDOSCOPY;  Service: Endoscopy;  Laterality: N/A;  . ESOPHAGOGASTRODUODENOSCOPY (EGD) WITH PROPOFOL N/A 09/16/2015   Procedure: ESOPHAGOGASTRODUODENOSCOPY (EGD) WITH PROPOFOL;  Surgeon: Manya Silvas, MD;  Location: [facility] ENDOSCOPY;  Service: Endoscopy;  Laterality: N/A;  . ESOPHAGOGASTRODUODENOSCOPY (EGD) WITH PROPOFOL N/A 03/16/2016   Procedure: ESOPHAGOGASTRODUODENOSCOPY (EGD) WITH PROPOFOL;  Surgeon: Manya Silvas, MD;  Location: [facility] ENDOSCOPY;  Service: Endoscopy;  Laterality: N/A;  . ESOPHAGOGASTRODUODENOSCOPY (EGD) WITH PROPOFOL N/A 09/14/2016   Procedure: ESOPHAGOGASTRODUODENOSCOPY (EGD) WITH PROPOFOL;  Surgeon: Manya Silvas, MD;  Location: [facility] ENDOSCOPY;  Service: Endoscopy;  Laterality: N/A;  . ESOPHAGOGASTRODUODENOSCOPY (EGD) WITH PROPOFOL  N/A 11/05/2016   Procedure: ESOPHAGOGASTRODUODENOSCOPY (EGD) WITH PROPOFOL;  Surgeon: Manya Silvas, MD;  Location: [facility] ENDOSCOPY;  Service: Endoscopy;  Laterality: N/A;  . ESOPHAGOGASTRODUODENOSCOPY (EGD) WITH PROPOFOL N/A 02/06/2017   Procedure: ESOPHAGOGASTRODUODENOSCOPY (EGD) WITH PROPOFOL;  Surgeon: Manya Silvas, MD;  Location: [facility] ENDOSCOPY;  Service: Endoscopy;  Laterality: N/A;  . ESOPHAGOGASTRODUODENOSCOPY (EGD) WITH PROPOFOL N/A 04/24/2017   Procedure: ESOPHAGOGASTRODUODENOSCOPY (EGD) WITH PROPOFOL;  Surgeon: Manya Silvas, MD;  Location: [facility] ENDOSCOPY;  Service: Endoscopy;  Laterality: N/A;  . ESOPHAGOGASTRODUODENOSCOPY (EGD) WITH PROPOFOL N/A 07/31/2017   Procedure: ESOPHAGOGASTRODUODENOSCOPY (EGD) WITH PROPOFOL;  Surgeon: Manya Silvas, MD;  Location: [facility] ENDOSCOPY;  Service: Endoscopy;  Laterality: N/A;  . ESOPHAGOGASTRODUODENOSCOPY (EGD) WITH PROPOFOL N/A 08/08/2018   Procedure: ESOPHAGOGASTRODUODENOSCOPY (EGD) WITH PROPOFOL;  Surgeon: Manya Silvas, MD;  Location: [facility] ENDOSCOPY;  Service: Endoscopy;  Laterality: N/A;  . ESOPHAGOGASTRODUODENOSCOPY (EGD) WITH PROPOFOL N/A 05/21/2020   Procedure: ESOPHAGOGASTRODUODENOSCOPY (EGD) WITH PROPOFOL;  Surgeon: [name] Manifold, MD;  Location: [facility] ENDOSCOPY;  Service: Endoscopy;  Laterality: N/A;  . ESOPHAGOGASTRODUODENOSCOPY (EGD) WITH PROPOFOL N/A 07/18/2020   Procedure: ESOPHAGOGASTRODUODENOSCOPY (EGD) WITH PROPOFOL;  Surgeon: Toledo, Benay Pike, MD;  Location: [facility] ENDOSCOPY;  Service: Gastroenterology;  Laterality: N/A;  . ESOPHAGOGASTRODUODENOSCOPY (EGD) WITH PROPOFOL N/A 07/28/2020   Procedure: ESOPHAGOGASTRODUODENOSCOPY (EGD) WITH PROPOFOL;  Surgeon: Lesly Rubenstein, MD;  Location: [facility] ENDOSCOPY;  Service: Endoscopy;  Laterality: N/A;  . GIVENS CAPSULE STUDY N/A 12/05/2017   Procedure: GIVENS CAPSULE STUDY;  Surgeon: Manya Silvas, MD;  Location: [facility] ENDOSCOPY;  Service: Endoscopy;  Laterality: N/A;  .  TONSILLECTOMY    . TONSILLECTOMY AND ADENOIDECTOMY    . ULNAR NERVE TRANSPOSITION    . UVULOPALATOPHARYNGOPLASTY      Prior to Admission medications   Medication Sig Start Date End Date Taking? Authorizing Provider  atorvastatin (LIPITOR) 10 MG tablet Take 20 mg by mouth every evening.  03/23/20   [provider]  carvedilol (COREG) 3.125 MG tablet Take 1 tablet (3.125 mg total) by mouth 2 (two) times daily with a meal. 07/30/20   Florencia Reasons, MD  ferrous sulfate 325 (65 FE) MG tablet Take 1 tablet (325 mg total) by mouth daily. This medication can cause black stools 06/11/20   Wyvonnia Dusky, MD  lactulose (CHRONULAC) 10 GM/15ML solution Take 90 mLs (60 g total) by mouth daily. Patient taking differently: Take 45 g by mouth 2 (two) times daily.  05/04/20   Ezekiel Slocumb, DO  LORazepam (ATIVAN) 0.5 MG tablet Take 1 tablet (0.5 mg total) by mouth 3 (three) times daily as needed for Anxiety    [provider]  metolazone (ZAROXOLYN) 5 MG tablet Take 5 mg by mouth daily. . 05/13/20   [provider]  NOVOLOG MIX 70/30 FLEXPEN (70-30) 100 UNIT/ML FlexPen 0-84 Units as directed. Inject 0 - 84 Units (sliding scale) Subcutaneously twice a day before meals, at breakfast and at dinner. 03/17/20   [provider]  pantoprazole (PROTONIX) 40 MG tablet Take 1 tablet (40 mg total) by mouth 2 (two) times daily. 03/06/19 07/25/20  Dustin Flock, MD  potassium chloride SA (KLOR-CON) 20 MEQ tablet Take 20 mEq by mouth 2 (two) times daily. 07/06/20   [provider]  rifaximin (XIFAXAN) 550 MG TABS tablet Take 1 tablet (550 mg total) by mouth 2 (two) times daily. 02/12/20   Loletha Grayer, MD  sodium chloride (OCEAN) 0.65 % SOLN nasal spray Place 1 spray into both nostrils as needed for congestion (nose irritation). 02/12/20   Loletha Grayer, MD  tamsulosin (FLOMAX) 0.4 MG CAPS capsule Take 1 capsule (0.4 mg total) by mouth daily after supper. 05/03/20   Ezekiel Slocumb, DO  torsemide (DEMADEX) 100 MG tablet Take 1 tablet (100 mg total) by mouth daily. 05/04/20   Ezekiel Slocumb, DO    Family History  Problem Relation Age of Onset  . Diabetes Other   . Transient ischemic attack Father   . CAD Father      Social History   Tobacco Use  . Smoking status: Former Smoker    Years: 20.00    Types: Cigars, Cigarettes  . Smokeless tobacco: Never Used  Vaping Use  . Vaping Use: Never used  Substance Use Topics  . Alcohol use: No    Comment: stopped 15 years ago  . Drug use: No    Allergies as of 08/01/2020  . (No Known Allergies)    Review of Systems:    All systems reviewed and negative except where noted in HPI.   Physical Exam:  Vital signs in last 24 hours: Temp:  [97.8 F (36.6 C)-98.7 F (37.1 C)] 98.1 F (36.7 C) (11/04 1126)  Pulse Rate:  [56-64] 59 (11/04 1126) Resp:  [16-19] 18 (11/04 1126) BP: (123-157)/(40-62) 157/50 (11/04 1126) SpO2:  [97 %-99 %] 99 % (11/04 1126) Last BM Date: 08/03/20 General:   Pleasant, cooperative in NAD Head:  Normocephalic and atraumatic. Eyes:   No icterus.   Conjunctiva pink. PERRLA. Ears:  Normal auditory acuity. Neck:  Supple; no masses or thyroidomegaly Lungs: Respirations even and unlabored. Lungs clear to auscultation bilaterally.   No wheezes, crackles, or rhonchi.  Heart:  Regular rate and rhythm;  Without murmur, clicks, rubs or gallops Abdomen:  Soft, positive distention with massive amount of ascites, nontender. Normal bowel sounds. No appreciable masses or hepatomegaly.  No rebound or guarding.  Fluid wave and shifting dullness present Rectal:  Not performed. Msk:  Symmetrical without gross deformities.   Extremities: +3 edema of the lower extremities, cyanosis or clubbing. Neurologic:  Alert and oriented x3;  grossly normal neurologically. Skin:  Intact without significant lesions or rashes. Cervical Nodes:  No significant cervical adenopathy. Psych:  Alert and cooperative. Normal  affect.  LAB RESULTS: Recent Labs    08/03/20 0514 08/03/20 0832 08/04/20 0420  WBC 1.6* 1.8* 2.1*  HGB 6.7* 7.7* 7.4*  HCT 21.6* 24.0* 23.3*  PLT 63* 70* 63*   BMET Recent Labs    08/02/20 0434 08/03/20 0514 08/04/20 0420  NA 143 144 140  K 4.1 3.7 4.1  CL 108 108 104  CO2 25 26 27   GLUCOSE 229* 127* 162*  BUN 84* 76* 72*  CREATININE 2.62* 2.57* 2.67*  CALCIUM 8.6* 8.4* 8.2*   LFT Recent Labs    08/04/20 0420  ALBUMIN 2.6*   PT/INR No results for input(s): LABPROT, INR in the last 72 hours.  STUDIES: No results found.    Impression / Plan:   Assessment: Principal Problem:   Hepatic encephalopathy (HCC) Active Problems:   Cirrhosis (HCC)   Gastroesophageal reflux disease   Chronic venous stasis dermatitis of both lower extremities   Obesity, Class III, BMI 40-49.9 (morbid obesity) (HCC)   Barrett's esophagus   Type 2 diabetes mellitus with stage 3 chronic kidney disease (HCC)   Anxiety   Anemia of chronic disease   Corey West is a 69 y.o. y/o male with nonalcoholic fatty liver disease with cirrhosis.  The patient also has a history of reflux obesity and Barrett's esophagus.  The patient has had 2 recent upper endoscopies and he was admitted with hepatic encephalopathy.  The patient is not having hepatic encephalopathy at this time but reports a considerable amount of diarrhea.  The patient did have a history of C. difficile in the past and I would recommend possibly having the hospitalist discussed with the infectious disease specialist whether a C. difficile could be ordered.     Plan:  I will order the patient to have his stool sent off for a GI panel.  The patient is presently on 20 g of lactulose twice a day.  He is not having any hepatic encephalopathy and appears that the lactulose was decreased from 30 mg twice a day and prior to that he was on 45 mg twice a day.  The patient has also been explained that it is unlikely that he will be transferred  to [facility] due to a bed shortage at all the area hospitals and that at this time he is not a candidate for a liver transplant.  He has been explained the plan agrees with it.  Thank you for involving me  in the care of this patient.      LOS: 3 days   Lucilla Lame, MD, [facility] 08/04/2020, 5:16 PM,  Pager [phone] 7am-5pm  Check AMION for 5pm -7am coverage and on weekends   Note: This dictation was prepared with Dragon dictation along with smaller phrase technology. Any transcriptional errors that result from this process are unintentional.

## 2020-08-04 NOTE — Progress Notes (Signed)
[facility]  ROUNDING NOTE   Subjective:   [name] is asking for an opinion from Nephrology about his chances for a kidney transplant.   Objective:  Vital signs in last 24 hours:  Temp:  [97.8 F (36.6 C)-98.7 F (37.1 C)] 98.1 F (36.7 C) (11/04 1126) Pulse Rate:  [56-64] 59 (11/04 1126) Resp:  [16-19] 18 (11/04 1126) BP: (123-157)/(40-62) 157/50 (11/04 1126) SpO2:  [97 %-99 %] 99 % (11/04 1126)  Weight change:  Filed Weights   08/01/20 0627 08/01/20 1730  Weight: 136 kg 136 kg    Intake/Output: I/O last 3 completed shifts: In: 840 [P.O.:840] Out: 1100 [Urine:1100]   Intake/Output this shift:  Total I/O In: 360 [P.O.:360] Out: -   Physical Exam: General: NAD,   Head: Normocephalic, atraumatic. Moist oral mucosal membranes  Eyes: Anicteric, PERRL  Neck: Supple, trachea midline  Lungs:  Clear to auscultation  Heart: Regular rate and rhythm  Abdomen:  Soft, nontender,   Extremities: + peripheral edema.  Neurologic: Nonfocal, moving all four extremities  Skin: No lesions        Basic Metabolic Panel: Recent Labs  Lab 07/29/20 0606 07/29/20 0606 08/01/20 0635 08/01/20 0635 08/02/20 0434 08/03/20 0514 08/04/20 0420  NA 143  --  143  --  143 144 140  K 3.9  --  3.9  --  4.1 3.7 4.1  CL 107  --  109  --  108 108 104  CO2 25  --  24  --  25 26 27   GLUCOSE 120*  --  187*  --  229* 127* 162*  BUN 107*  --  92*  --  84* 76* 72*  CREATININE 3.06*  --  2.67*  --  2.62* 2.57* 2.67*  CALCIUM 8.9   < > 8.5*   < > 8.6* 8.4* 8.2*  PHOS  --   --   --   --   --  3.6 3.2   < > = values in this interval not displayed.    Liver Function Tests: Recent Labs  Lab 07/29/20 0606 08/01/20 0635 08/03/20 0514 08/04/20 0420  AST 24 30  --   --   ALT 19 20  --   --   ALKPHOS 74 78  --   --   BILITOT 1.4* 1.1  --   --   PROT 6.2* 6.4*  --   --   ALBUMIN 3.0* 2.9* 2.5* 2.6*   No results for input(s): LIPASE, AMYLASE in the last 168  hours. Recent Labs  Lab 07/29/20 0606 08/01/20 0841  AMMONIA 73* 109*    CBC: Recent Labs  Lab 08/01/20 0635 08/02/20 0434 08/03/20 0514 08/03/20 0832 08/04/20 0420  WBC 3.7* 2.4* 1.6* 1.8* 2.1*  NEUTROABS 3.0  --   --   --   --   HGB 7.8* 7.4* 6.7* 7.7* 7.4*  HCT 24.2* 23.6* 21.6* 24.0* 23.3*  MCV 94.5 94.4 95.2 94.5 94.0  PLT 82* 74* 63* 70* 63*    Cardiac Enzymes: No results for input(s): CKTOTAL, CKMB, CKMBINDEX, TROPONINI in the last 168 hours.  BNP: Invalid input(s): POCBNP  CBG: Recent Labs  Lab 08/03/20 1151 08/03/20 1726 08/03/20 2212 08/04/20 0743 08/04/20 1125  GLUCAP 197* 187* 180* 132* 184*    Microbiology: Results for orders placed or performed during the hospital encounter of 08/01/20  Respiratory Panel by RT PCR (Flu A&B, Covid) - Nasopharyngeal Swab     Status: None  Collection Time: 08/01/20  9:23 AM   Specimen: Nasopharyngeal Swab  Result Value Ref Range Status   SARS Coronavirus 2 by RT PCR NEGATIVE NEGATIVE Final    Comment: (NOTE) SARS-CoV-2 target nucleic acids are NOT DETECTED.  The SARS-CoV-2 RNA is generally detectable in upper respiratoy specimens during the acute phase of infection. The lowest concentration of SARS-CoV-2 viral copies this assay can detect is 131 copies/mL. A negative result does not preclude SARS-Cov-2 infection and should not be used as the sole basis for treatment or other patient management decisions. A negative result may occur with  improper specimen collection/handling, submission of specimen other than nasopharyngeal swab, presence of viral mutation(s) within the areas targeted by this assay, and inadequate number of viral copies (<131 copies/mL). A negative result must be combined with clinical observations, patient history, and epidemiological information. The expected result is Negative.  Fact Sheet for Patients:  PinkCheek.be  Fact Sheet for Healthcare Providers:   GravelBags.it  This test is no t yet approved or cleared by the [place] FDA and  has been authorized for detection and/or diagnosis of SARS-CoV-2 by FDA under an Emergency Use Authorization (EUA). This EUA will remain  in effect (meaning this test can be used) for the duration of the COVID-19 declaration under Section 564(b)(1) of the Act, 21 U.S.C. section 360bbb-3(b)(1), unless the authorization is terminated or revoked sooner.     Influenza A by PCR NEGATIVE NEGATIVE Final   Influenza B by PCR NEGATIVE NEGATIVE Final    Comment: (NOTE) The Xpert Xpress SARS-CoV-2/FLU/RSV assay is intended as an aid in  the diagnosis of influenza from Nasopharyngeal swab specimens and  should not be used as a sole basis for treatment. Nasal washings and  aspirates are unacceptable for Xpert Xpress SARS-CoV-2/FLU/RSV  testing.  Fact Sheet for Patients: PinkCheek.be  Fact Sheet for Healthcare Providers: GravelBags.it  This test is not yet approved or cleared by the [place] FDA and  has been authorized for detection and/or diagnosis of SARS-CoV-2 by  FDA under an Emergency Use Authorization (EUA). This EUA will remain  in effect (meaning this test can be used) for the duration of the  Covid-19 declaration under Section 564(b)(1) of the Act, 21  U.S.C. section 360bbb-3(b)(1), unless the authorization is  terminated or revoked. Performed at [facility], [address] [place], [place] [address]     Coagulation Studies: No results for input(s): LABPROT, INR in the last 72 hours.  Urinalysis: No results for input(s): COLORURINE, LABSPEC, PHURINE, GLUCOSEU, HGBUR, BILIRUBINUR, KETONESUR, PROTEINUR, UROBILINOGEN, NITRITE, LEUKOCYTESUR in the last 72 hours.  Invalid input(s): APPERANCEUR    Imaging: No results found.   Medications:    sodium chloride      atorvastatin  20 mg  Oral QPM   carvedilol  3.125 mg Oral BID WC   dextromethorphan-guaiFENesin  1 tablet Oral BID   ferrous sulfate  325 mg Oral Daily   insulin aspart  0-15 Units Subcutaneous TID WC   insulin aspart  0-5 Units Subcutaneous QHS   insulin glargine  10 Units Subcutaneous QHS   lactulose  20 g Oral BID   pantoprazole  40 mg Oral BID   potassium chloride SA  20 mEq Oral BID   rifaximin  550 mg Oral BID   sodium chloride flush  3 mL Intravenous Q12H   tamsulosin  0.4 mg Oral QPC supper   torsemide  100 mg Oral Daily   sodium chloride, LORazepam, sodium chloride, sodium  chloride flush  Assessment/ Plan:  [name] is a 69 y.o. white male with cirrhosis, esophageal varices, iron deficiency anemia, chronic venous stasis of both lower extremities, who is admitted to [facility] on 08/01/2020 for Hepatic encephalopathy ([place]) [K72.90]  1. Acute kidney injury on chronic kidney disease stage IV: creatinine has improved since last admission.  Chronic kidney disease secondary to diabetic nephropathy Acute kidney injury secondary to acute hepatorenal syndrome No acute indication for dialysis. Not a candidate for renal transplant.   2. Hypokalemia:  - potassium chloride  3. Hypertension: elevated at 157/50. Current regimen of torsemide, carvedilol and tamsulosin  4. Anemia with renal failure: low threshold for PRBC transfusion.    LOS: 3 Martyn Timme 11/4/20213:54 PM

## 2020-08-04 NOTE — Consult Note (Signed)
Consultation Note Date: 08/04/2020   Patient Name: Corey West  DOB: 1951/02/12  MRN: [id number]  Age / Sex: 69 y.o., male  PCP: Leonel Ramsay, MD Referring Physician: Lorella Nimrod, MD  Reason for Consultation: Establishing goals of care  HPI/Patient Profile: 69 y.o. male  with past medical history of liver cirrhosis, severe portal hypertensive gastropathy, hepatic encephalopathy, esophageal varices, gastric antral vascular ectasias, chronic diastolic heart failure, chronic lower extremity venous stasis, diabetes, stage 4 chronic kidney disease, hypertension, anemia, anxiety, BPH admitted on 08/01/2020 with confusion with hepatic encephalopathy. 8 admissions in past 6 months. Palliative care requested for GOC given progressive liver disease with frequent complications leading to frequent hospitalization.   Clinical Assessment and Goals of Care: I met today with Mr. Kenroy after reviewing records. He is very upset and expressing frustration with his medical care. He complains of being left in stool and not having a rectal tube. He is demanding rectal tube placement. He speaks very poorly of his attending provider. I assisted with having NT to come clean him up and RN in process of replacing rectal tube after finishing with an emergency with another patient. I explained this to Mr. Mallon.   I spoke more with Mr. Pine after he was cleaned up. He has had consultation with palliative care previously. He is very clear in his goals to live as long as possible. He desires aggressive care and all measures to prolong his life. He tells me that he has been in the process of being evaluated at [facility] for liver transplant. We discussed that his kidney disease could complicate ability for transplant. We discussed the importance of social support and reliable caregivers as well. Mr. Krider was surprised when I mentioned that there  could be reasons he would be found not to be a good transplant candidate. He tells me "I have to have a transplant, otherwise I will die." He then tells me that he will have to find one on the black market. Overall he is very hopeful to pursue these leads and continue to work closely with GI.   We discussed goals of care in event of worsening health. He endorses desire for full code and "my niece knows when to pull the plug." He reports that they have had these discussions previously and he does have completed HCPOA in electronic records. He does not want life prolonged in a persistent vegetative state or in the event he would need to leave his home for long term care - this would not be acceptable to him.   All questions/concerns addressed to the best of my ability. Emotional support provided.   Primary Decision Maker PATIENT    SUMMARY OF RECOMMENDATIONS   - Full code; niece is HCPOA - Hopeful for liver transplant  Code Status/Advance Care Planning:  Full code   Symptom Management:   Per attending and GI  Palliative Prophylaxis:   Bowel Regimen, Delirium Protocol, Frequent Pain Assessment, Palliative Wound Care and Turn Reposition  Additional Recommendations (Limitations, Scope, Preferences):  Full Scope Treatment  Psycho-social/Spiritual:   Desire for further Chaplaincy support:no  Additional Recommendations: Caregiving  Support/Resources  Prognosis:   Overall prognosis poor with progressing liver disease.   Discharge Planning: To Be Determined      Primary Diagnoses: Present on Admission: . Hepatic encephalopathy ([place]) . Anemia of chronic disease . Anxiety . Barrett's esophagus . Gastroesophageal reflux disease . Chronic venous stasis dermatitis of both lower extremities . Cirrhosis ([place]) . Obesity, Class III, BMI 40-49.9 (morbid obesity) ([place])   I have reviewed the medical record, interviewed the patient and family, and examined the patient. The following  aspects are pertinent.  Past Medical History:  Diagnosis Date  . A-fib ([place])   . Anemia   . Anxiety    controlled;   . Arthritis   . AVM (arteriovenous malformation) of stomach, acquired with hemorrhage   . Barrett's esophagus   . CHF (congestive heart failure) ([place])   . Chronic kidney disease    renal infufficiency  . Cirrhosis ([place])   . COPD (chronic obstructive pulmonary disease) ([place])   . Depression    controlled;   Marland Kitchen Diabetes mellitus without complication ([place])    not controlled, taking insulin but sugar continues to run high;   Marland Kitchen Dysrhythmia   . Edema   . Esophageal varices ([place])   . GAVE (gastric antral vascular ectasia)   . GERD (gastroesophageal reflux disease)   . Hepatic encephalopathy ([place])   . History of hiatal hernia   . Hyperlipidemia   . Hypertension    controlled well;   . IPMN (intraductal papillary mucinous neoplasm)   . Liver cirrhosis ([place])   . Nephropathy, diabetic ([place])   . Obesity   . Pancytopenia ([place])   . Polyp, stomach    with chronic blood loss  . Portal hypertensive gastropathy ([place])   . Sleep apnea    does not wear a cpap, Medicare would not pay for it   . Venous stasis dermatitis of both lower extremities   . Venous stasis of both lower extremities    with cellulitis   Social History   Socioeconomic History  . Marital status: Single    Spouse name: Not on file  . Number of children: Not on file  . Years of education: Not on file  . Highest education level: Not on file  Occupational History  . Occupation: disabled  Tobacco Use  . Smoking status: Former Smoker    Years: 20.00    Types: Cigars, Cigarettes  . Smokeless tobacco: Never Used  Vaping Use  . Vaping Use: Never used  Substance and Sexual Activity  . Alcohol use: No    Comment: stopped 15 years ago  . Drug use: No  . Sexual activity: Not Currently  Other Topics Concern  . Not on file  Social History Narrative   Lives alone, neighbor helps occasionally    Social  Determinants of Health   Financial Resource Strain:   . Difficulty of Paying Living Expenses: Not on file  Food Insecurity:   . Worried About Charity fundraiser in the Last Year: Not on file  . Ran Out of Food in the Last Year: Not on file  Transportation Needs:   . Lack of Transportation (Medical): Not on file  . Lack of Transportation (Non-Medical): Not on file  Physical Activity:   . Days of Exercise per Week: Not on file  . Minutes of Exercise per Session: Not on file  Stress:   .  Feeling of Stress : Not on file  Social Connections:   . Frequency of Communication with Friends and Family: Not on file  . Frequency of Social Gatherings with Friends and Family: Not on file  . Attends Religious Services: Not on file  . Active Member of Clubs or Organizations: Not on file  . Attends Archivist Meetings: Not on file  . Marital Status: Not on file   Family History  Problem Relation Age of Onset  . Diabetes Other   . Transient ischemic attack Father   . CAD Father    Scheduled Meds: . atorvastatin  20 mg Oral QPM  . carvedilol  3.125 mg Oral BID WC  . dextromethorphan-guaiFENesin  1 tablet Oral BID  . ferrous sulfate  325 mg Oral Daily  . insulin aspart  0-15 Units Subcutaneous TID WC  . insulin aspart  0-5 Units Subcutaneous QHS  . insulin glargine  10 Units Subcutaneous QHS  . lactulose  20 g Oral BID  . pantoprazole  40 mg Oral BID  . potassium chloride SA  20 mEq Oral BID  . rifaximin  550 mg Oral BID  . sodium chloride flush  3 mL Intravenous Q12H  . tamsulosin  0.4 mg Oral QPC supper  . torsemide  100 mg Oral Daily   Continuous Infusions: . sodium chloride     PRN Meds:.sodium chloride, LORazepam, sodium chloride, sodium chloride flush No Known Allergies Review of Systems  Gastrointestinal: Positive for diarrhea.    Physical Exam Vitals and nursing note reviewed.  Constitutional:      General: He is not in acute distress.    Appearance: He is  morbidly obese. He is ill-appearing.  Cardiovascular:     Rate and Rhythm: Bradycardia present.  Pulmonary:     Effort: Pulmonary effort is normal. No tachypnea, accessory muscle usage or respiratory distress.  Neurological:     Mental Status: He is alert and oriented to person, place, and time.     Vital Signs: BP (!) 155/62 (BP Location: Right Arm)   Pulse (!) 57   Temp 98.6 F (37 C) (Oral)   Resp 18   Ht $R'5\' 9"'yG$  (1.753 m)   Wt 136 kg   SpO2 97%   BMI 44.28 kg/m  Pain Scale: 0-10   Pain Score: 0-No pain   SpO2: SpO2: 97 % O2 Device:SpO2: 97 % O2 Flow Rate: .   IO: Intake/output summary:   Intake/Output Summary (Last 24 hours) at 08/04/2020 1112 Last data filed at 08/04/2020 1033 Gross per 24 hour  Intake 480 ml  Output 700 ml  Net -220 ml    LBM: Last BM Date: 08/03/20 Baseline Weight: Weight: 136 kg Most recent weight: Weight: 136 kg     Palliative Assessment/Data:     Time In: 1100 Time Out: 1150 Time Total: 50 min Greater than 50%  of this time was spent counseling and coordinating care related to the above assessment and plan.  Signed by: Vinie Sill, NP Palliative Medicine Team Pager # [phone] (M-F 8a-5p) Team Phone # [phone] (Nights/Weekends)

## 2020-08-04 NOTE — NC FL2 (Signed)
Corey West LEVEL OF CARE SCREENING TOOL     IDENTIFICATION  Patient Name: Corey West Birthdate: July 13, 1951 Sex: male Admission Date (Current Location): 08/01/2020  [place] and [place] Number:  Engineering geologist and Address:  [facility], [address], [place], [place] [address]      Provider Number: [id number]  Attending Physician Name and Address:  Lorella Nimrod, MD  Relative Name and Phone Number:       Current Level of Care: Hospital Recommended Level of Care: [place] Prior Approval Number:    Date Approved/Denied:   PASRR Number: [id number]  Discharge Plan: SNF    Current Diagnoses: Patient Active Problem List   Diagnosis Date Noted  . GI bleed 07/26/2020  . Acute on chronic anemia 07/25/2020  . Generalized weakness 07/25/2020  . SIRS (systemic inflammatory response syndrome) ([place]) 07/07/2020  . Anemia   . Hypothermia   . Anemia of chronic disease 06/07/2020  . Prolonged QT interval 06/07/2020  . Hypoglycemia 06/07/2020  . Ascites 06/07/2020  . Portal hypertensive gastropathy ([place])   . Symptomatic anemia 05/19/2020  . Acute blood loss anemia 05/19/2020  . Acute GI bleeding 05/19/2020  . Bacteremia due to Klebsiella pneumoniae 04/26/2020  . Palliative care by specialist   . DNR (do not resuscitate) discussion   . COPD with chronic bronchitis ([place]) 04/23/2020  . Paroxysmal atrial fibrillation ([place]) 04/20/2020  . Cellulitis of left leg 02/23/2020  . Contusion of ribs, left, initial encounter 02/23/2020  . Acute hypoxemic respiratory failure ([place]) 02/04/2020  . Volume overload 01/28/2020  . Hyperlipidemia associated with type 2 diabetes mellitus ([place]) 01/28/2020  . Liver cirrhosis ([place])   . Anxiety   . SOB (shortness of breath)   . Elevated troponin   . CKD (chronic kidney disease) stage 4, GFR 15-29 ml/min (HCC)   . Unstageable pressure ulcer of sacral region ([place]) 11/28/2019  . Chronic  diastolic CHF (congestive heart failure) ([place]) 11/27/2019  . Type 2 diabetes mellitus with stage 3 chronic kidney disease ([place]) 11/27/2019  . Cellulitis of left lower extremity 11/27/2019  . Cellulitis of lower extremity 11/27/2019  . Cyst of pancreas 05/18/2019  . HCAP (healthcare-associated pneumonia) 02/03/2019  . Aphasia 01/14/2019  . Acute on chronic renal failure ([place]) 01/14/2019  . Malnutrition of moderate degree 12/03/2018  . C. difficile diarrhea 12/02/2018  . Ankle fracture, left, closed, initial encounter 10/15/2018  . Ankle fracture, right, closed, initial encounter 10/15/2018  . Ankle fracture 10/15/2018  . Pressure injury of skin 07/19/2018  . UTI (urinary tract infection) 07/18/2018  . Acute kidney injury superimposed on CKD ([place]) 01/02/2018  . 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 Goals of care, counseling/discussion   . Palliative care encounter   . Acute hepatic encephalopathy 12/16/2017  . Hepatic encephalopathy ([place]) 10/18/2017  . Hypokalemia 10/09/2017  . Hyperglycemia 10/09/2017  . Sepsis ([place]) 06/12/2017  . Type 2 diabetes mellitus with diabetic nephropathy, with long-term current use of insulin ([place]) 05/29/2017  . Long term (current) use of insulin ([place]) 05/29/2017  . Cellulitis in diabetic foot ([place]) 02/21/2017  . Cellulitis 02/21/2017  . Upper GI bleed/melena 07/20/2015  . Hematemesis 02/08/2015  . Cirrhosis ([place]) 02/08/2015  . Esophageal varices ([place]) 02/08/2015  . Gastroesophageal reflux disease 02/08/2015  . Hyperlipidemia 02/08/2015  . Chronic venous stasis dermatitis of both lower extremities 10/06/2014  . Cutaneous abscess of limb, unspecified 09/17/2014  . Edema 05/25/2014  . Hypertension 05/25/2014  . Obesity,  Class III, BMI 40-49.9 (morbid obesity) ([place]) 05/25/2014  . Sleep apnea 05/25/2014  . Barrett's esophagus 05/25/2014  . Pancytopenia ([place]) 05/25/2014  . History of esophageal varices 05/25/2014  . Iron deficiency anemia  12/04/2013  . Thrombocytopenia ([place]) 12/04/2013  . Splenomegaly 11/04/2013    Orientation RESPIRATION BLADDER Height & Weight     Self, Time, Situation, Place  Normal Incontinent Weight: 299 lb 13.2 oz (136 kg) Height:  5\' 9"  (175.3 cm)  BEHAVIORAL SYMPTOMS/MOOD NEUROLOGICAL BOWEL NUTRITION STATUS      Incontinent Diet (heart healthy/carb modified; thin liquids)  AMBULATORY STATUS COMMUNICATION OF NEEDS Skin   Limited Assist Verbally Other (Comment) (dermatitis- buttocks; non pressure wound- left leg)                       Personal Care Assistance Level of Assistance  Bathing, Feeding, Dressing Bathing Assistance: Limited assistance Feeding assistance: Independent Dressing Assistance: Limited assistance     Functional Limitations Info             SPECIAL CARE FACTORS FREQUENCY  PT (By licensed PT), OT (By licensed OT)     PT Frequency: 5 x/week OT Frequency: 5 x/week            Contractures      Additional Factors Info  Code Status, Allergies Code Status Info: full Allergies Info: nka           Current Medications (08/04/2020):  This is the current hospital active medication list Current Facility-Administered Medications  Medication Dose Route Frequency Provider Last Rate Last Admin  . 0.9 %  sodium chloride infusion  250 mL Intravenous PRN Agbata, Tochukwu, MD      . atorvastatin (LIPITOR) tablet 20 mg  20 mg Oral QPM Agbata, Tochukwu, MD   20 mg at 08/03/20 1716  . carvedilol (COREG) tablet 3.125 mg  3.125 mg Oral BID WC Agbata, Tochukwu, MD   3.125 mg at 08/03/20 1715  . dextromethorphan-guaiFENesin (MUCINEX DM) 30-600 MG per 12 hr tablet 1 tablet  1 tablet Oral BID Lorella Nimrod, MD   1 tablet at 08/03/20 2333  . ferrous sulfate tablet 325 mg  325 mg Oral Daily Agbata, Tochukwu, MD   325 mg at 08/03/20 0853  . insulin aspart (novoLOG) injection 0-15 Units  0-15 Units Subcutaneous TID WC Lorella Nimrod, MD   3 Units at 08/03/20 1728  . insulin aspart  (novoLOG) injection 0-5 Units  0-5 Units Subcutaneous QHS Amin, Soundra Pilon, MD      . insulin glargine (LANTUS) injection 10 Units  10 Units Subcutaneous QHS Lorella Nimrod, MD   10 Units at 08/03/20 2333  . lactulose (CHRONULAC) 10 GM/15ML solution 20 g  20 g Oral BID Lorella Nimrod, MD      . LORazepam (ATIVAN) tablet 0.5 mg  0.5 mg Oral TID PRN Agbata, Tochukwu, MD   0.5 mg at 08/03/20 0859  . pantoprazole (PROTONIX) EC tablet 40 mg  40 mg Oral BID Agbata, Tochukwu, MD   40 mg at 08/03/20 2333  . potassium chloride SA (KLOR-CON) CR tablet 20 mEq  20 mEq Oral BID Agbata, Tochukwu, MD   20 mEq at 08/03/20 2333  . rifaximin (XIFAXAN) tablet 550 mg  550 mg Oral BID Agbata, Tochukwu, MD   550 mg at 08/03/20 2333  . sodium chloride (OCEAN) 0.65 % nasal spray 1 spray  1 spray Each Nare PRN Agbata, Tochukwu, MD      . sodium chloride flush (NS) 0.9 %  injection 3 mL  3 mL Intravenous Q12H Agbata, Tochukwu, MD   3 mL at 08/03/20 0854  . sodium chloride flush (NS) 0.9 % injection 3 mL  3 mL Intravenous PRN Agbata, Tochukwu, MD      . tamsulosin (FLOMAX) capsule 0.4 mg  0.4 mg Oral QPC supper Agbata, Tochukwu, MD   0.4 mg at 08/03/20 1716  . torsemide (DEMADEX) tablet 100 mg  100 mg Oral Daily Agbata, Tochukwu, MD   100 mg at 08/03/20 0459     Discharge Medications: Please see discharge summary for a list of discharge medications.  Relevant Imaging Results:  Relevant Lab Results:   Additional Information SS#: [id number]  [place], LCSW

## 2020-08-05 LAB — GASTROINTESTINAL PANEL BY PCR, STOOL (REPLACES STOOL CULTURE)

## 2020-08-05 LAB — CBC
HCT: 24.4 % — ABNORMAL LOW (ref 39.0–52.0)
Hemoglobin: 7.6 g/dL — ABNORMAL LOW (ref 13.0–17.0)
MCH: 29.2 pg (ref 26.0–34.0)
MCHC: 31.1 g/dL (ref 30.0–36.0)
MCV: 93.8 fL (ref 80.0–100.0)
Platelets: 59 10*3/uL — ABNORMAL LOW (ref 150–400)
RBC: 2.6 MIL/uL — ABNORMAL LOW (ref 4.22–5.81)
RDW: 15.6 % — ABNORMAL HIGH (ref 11.5–15.5)
WBC: 1.8 10*3/uL — ABNORMAL LOW (ref 4.0–10.5)
nRBC: 0 % (ref 0.0–0.2)

## 2020-08-05 LAB — RENAL FUNCTION PANEL
Albumin: 2.5 g/dL — ABNORMAL LOW (ref 3.5–5.0)
Anion gap: 8 (ref 5–15)
BUN: 66 mg/dL — ABNORMAL HIGH (ref 8–23)
CO2: 26 mmol/L (ref 22–32)
Calcium: 8.4 mg/dL — ABNORMAL LOW (ref 8.9–10.3)
Chloride: 106 mmol/L (ref 98–111)
Creatinine, Ser: 2.87 mg/dL — ABNORMAL HIGH (ref 0.61–1.24)
GFR, Estimated: 23 mL/min — ABNORMAL LOW (ref >60)
Glucose, Bld: 108 mg/dL — ABNORMAL HIGH (ref 70–99)
Phosphorus: 3.3 mg/dL (ref 2.5–4.6)
Potassium: 4.1 mmol/L (ref 3.5–5.1)
Sodium: 140 mmol/L (ref 135–145)

## 2020-08-05 LAB — GLUCOSE, CAPILLARY
Glucose-Capillary: 97 mg/dL (ref 70–99)
Glucose-Capillary: 180 mg/dL — ABNORMAL HIGH (ref 70–99)
Glucose-Capillary: 151 mg/dL — ABNORMAL HIGH (ref 70–99)
Glucose-Capillary: 118 mg/dL — ABNORMAL HIGH (ref 70–99)

## 2020-08-05 MED ORDER — LACTULOSE 10 GM/15ML PO SOLN
25.0000 g | Freq: Two times a day (BID) | ORAL | Status: DC
  Administered 2020-08-05 – 2020-08-09 (×6): 25 g via ORAL
  Filled 2020-08-05 (×7): qty 60

## 2020-08-05 NOTE — Progress Notes (Signed)
[facility]  ROUNDING NOTE   Subjective:   Patient resting in bed, appears pleasant and comfortable. He just gone his wound care done by his RN. He states he is thinking about palliative care/ hospice options. He expresses his concerns that he is not able to take care of himself at home.  Objective:  Vital signs in last 24 hours:  Temp:  [97.9 F (36.6 C)-98.6 F (37 C)] 98.2 F (36.8 C) (11/05 1112) Pulse Rate:  [54-63] 55 (11/05 1112) Resp:  [16-20] 16 (11/05 1112) BP: (146-158)/(49-66) 151/49 (11/05 1112) SpO2:  [97 %-99 %] 97 % (11/05 1112)  Weight change:  Filed Weights   08/01/20 0627 08/01/20 1730  Weight: 136 kg 136 kg    Intake/Output: I/O last 3 completed shifts: In: 840 [P.O.:840] Out: 1 [Urine:1]   Intake/Output this shift:  Total I/O In: 240 [P.O.:240] Out: 200 [Urine:200]  Physical Exam: General: In no acute distress  Head:  Moist oral mucosal membranes  Eyes: Anicteric  Lungs:  Respirations symmetrical,unlabored, lungs diminished at the bases  Heart: S1S2, regular  Abdomen:  Soft, nontender, distended  Extremities: 2+ peripheral edema.  Neurologic: Awake, alert, Oriented  Skin: Bilateral lower legs with dressing clean,dry and intact    Basic Metabolic Panel: Recent Labs  Lab 08/01/20 0635 08/01/20 0635 08/02/20 0434 08/02/20 0434 08/03/20 0514 08/04/20 0420 08/05/20 0650  NA 143  --  143  --  144 140 140  K 3.9  --  4.1  --  3.7 4.1 4.1  CL 109  --  108  --  108 104 106  CO2 24  --  25  --  26 27 26   GLUCOSE 187*  --  229*  --  127* 162* 108*  BUN 92*  --  84*  --  76* 72* 66*  CREATININE 2.67*  --  2.62*  --  2.57* 2.67* 2.87*  CALCIUM 8.5*   < > 8.6*   < > 8.4* 8.2* 8.4*  PHOS  --   --   --   --  3.6 3.2 3.3   < > = values in this interval not displayed.    Liver Function Tests: Recent Labs  Lab 08/01/20 0635 08/03/20 0514 08/04/20 0420 08/05/20 0650  AST 30  --   --   --   ALT 20  --   --   --   ALKPHOS 78   --   --   --   BILITOT 1.1  --   --   --   PROT 6.4*  --   --   --   ALBUMIN 2.9* 2.5* 2.6* 2.5*   No results for input(s): LIPASE, AMYLASE in the last 168 hours. Recent Labs  Lab 08/01/20 0841  AMMONIA 109*    CBC: Recent Labs  Lab 08/01/20 0635 08/01/20 0635 08/02/20 0434 08/03/20 0514 08/03/20 0832 08/04/20 0420 08/05/20 0650  WBC 3.7*   < > 2.4* 1.6* 1.8* 2.1* 1.8*  NEUTROABS 3.0  --   --   --   --   --   --   HGB 7.8*   < > 7.4* 6.7* 7.7* 7.4* 7.6*  HCT 24.2*   < > 23.6* 21.6* 24.0* 23.3* 24.4*  MCV 94.5   < > 94.4 95.2 94.5 94.0 93.8  PLT 82*   < > 74* 63* 70* 63* 59*   < > = values in this interval not displayed.    Cardiac Enzymes: No results for input(s):  CKTOTAL, CKMB, CKMBINDEX, TROPONINI in the last 168 hours.  BNP: Invalid input(s): POCBNP  CBG: Recent Labs  Lab 08/04/20 1125 08/04/20 1728 08/04/20 2035 08/05/20 0802 08/05/20 1152  GLUCAP 184* 160* 145* 97 180*    Microbiology: Results for orders placed or performed during the hospital encounter of 08/01/20  Respiratory Panel by RT PCR (Flu A&B, Covid) - Nasopharyngeal Swab     Status: None   Collection Time: 08/01/20  9:23 AM   Specimen: Nasopharyngeal Swab  Result Value Ref Range Status   SARS Coronavirus 2 by RT PCR NEGATIVE NEGATIVE Final    Comment: (NOTE) SARS-CoV-2 target nucleic acids are NOT DETECTED.  The SARS-CoV-2 RNA is generally detectable in upper respiratoy specimens during the acute phase of infection. The lowest concentration of SARS-CoV-2 viral copies this assay can detect is 131 copies/mL. A negative result does not preclude SARS-Cov-2 infection and should not be used as the sole basis for treatment or other patient management decisions. A negative result may occur with  improper specimen collection/handling, submission of specimen other than nasopharyngeal swab, presence of viral mutation(s) within the areas targeted by this assay, and inadequate number of viral  copies (<131 copies/mL). A negative result must be combined with clinical observations, patient history, and epidemiological information. The expected result is Negative.  Fact Sheet for Patients:  PinkCheek.be  Fact Sheet for Healthcare Providers:  GravelBags.it  This test is no t yet approved or cleared by the [place] FDA and  has been authorized for detection and/or diagnosis of SARS-CoV-2 by FDA under an Emergency Use Authorization (EUA). This EUA will remain  in effect (meaning this test can be used) for the duration of the COVID-19 declaration under Section 564(b)(1) of the Act, 21 U.S.C. section 360bbb-3(b)(1), unless the authorization is terminated or revoked sooner.     Influenza A by PCR NEGATIVE NEGATIVE Final   Influenza B by PCR NEGATIVE NEGATIVE Final    Comment: (NOTE) The Xpert Xpress SARS-CoV-2/FLU/RSV assay is intended as an aid in  the diagnosis of influenza from Nasopharyngeal swab specimens and  should not be used as a sole basis for treatment. Nasal washings and  aspirates are unacceptable for Xpert Xpress SARS-CoV-2/FLU/RSV  testing.  Fact Sheet for Patients: PinkCheek.be  Fact Sheet for Healthcare Providers: GravelBags.it  This test is not yet approved or cleared by the [place] FDA and  has been authorized for detection and/or diagnosis of SARS-CoV-2 by  FDA under an Emergency Use Authorization (EUA). This EUA will remain  in effect (meaning this test can be used) for the duration of the  Covid-19 declaration under Section 564(b)(1) of the Act, 21  U.S.C. section 360bbb-3(b)(1), unless the authorization is  terminated or revoked. Performed at [facility], [address] [place], [place] [address]     Coagulation Studies: No results for input(s): LABPROT, INR in the last 72 hours.  Urinalysis: No results for  input(s): COLORURINE, LABSPEC, PHURINE, GLUCOSEU, HGBUR, BILIRUBINUR, KETONESUR, PROTEINUR, UROBILINOGEN, NITRITE, LEUKOCYTESUR in the last 72 hours.  Invalid input(s): APPERANCEUR    Imaging: No results found.   Medications:   . sodium chloride     . atorvastatin  20 mg Oral QPM  . carvedilol  3.125 mg Oral BID WC  . dextromethorphan-guaiFENesin  1 tablet Oral BID  . ferrous sulfate  325 mg Oral Daily  . insulin aspart  0-15 Units Subcutaneous TID WC  . insulin aspart  0-5 Units Subcutaneous QHS  . insulin glargine  10 Units Subcutaneous QHS  . lactulose  25 g Oral BID  . pantoprazole  40 mg Oral BID  . potassium chloride SA  20 mEq Oral BID  . rifaximin  550 mg Oral BID  . sodium chloride flush  3 mL Intravenous Q12H  . tamsulosin  0.4 mg Oral QPC supper  . torsemide  100 mg Oral Daily   sodium chloride, LORazepam, sodium chloride, sodium chloride flush  Assessment/ Plan:  Mr. LUAN MABERRY is a 69 y.o. white male with cirrhosis, esophageal varices, iron deficiency anemia, chronic venous stasis of both lower extremities, who is admitted to [facility] on 08/01/2020 for Hepatic encephalopathy ([place]) [K72.90]  1. Acute kidney injury on chronic kidney disease stage IV  Chronic kidney disease secondary to diabetic nephropathy Acute kidney injury secondary to acute hepatorenal syndrome Lab Results  Component Value Date   CREATININE 2.87 (H) 08/05/2020   CREATININE 2.67 (H) 08/04/2020   CREATININE 2.57 (H) 08/03/2020  No acute indication for dialysis  2. Hypokalemia  Potassium 4.1 today Patient is on Potassium oral supplement  3. Hypertension:  BP readings stay above goal  Will restart Torsemide Patient is also on carvedilol and tamsulosin  4. Anemia with renal failure Patient received PRBC transfusion during previous recent admission Hemoglobin stays low,7.6 today  5.Diabetes with CKD Blood sugar readings elevated Patient is on Aspart and Lantus   LOS: 4 Nautika Cressey 11/5/202112:49 PM

## 2020-08-05 NOTE — Progress Notes (Signed)
PROGRESS NOTE    Corey West  JKK:[id number] DOB: Sep 04, 1951 DOA: 08/01/2020 PCP: Leonel Ramsay, MD   Brief Narrative: Taken from H&P Corey West is a 69 y.o. 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  He had an upper endoscopy which showed severe portal hypertensive gastropathy as well as esophageal mucosal changes secondary to established Barrett's disease in the lower third of the esophagus.  Palliative care was recommended during his last hospitalization which he declined. He was brought in today by EMS for evaluation of confusion patient stated that his nephew who was supposed to be helping him at home is not taking care of him and is not administering his medications as prescribed. Patient is noted to have elevated ammonia level at 109. Multiple hospitalizations for similar situations, generalized weakness, concern of GI bleed, worsening renal function and anasarca.  Palliative care was declined initially and he was referred to outpatient palliative care. Refusing to take lactulose without rectal tube which was placed due to his demand as he was becoming very nasty when trying to explain risk and benefits of rectal tube and why he does not need it. Also demanding to see his GI and nephrologist stating that you cannot take care of me because you are not a specialist-asked Dr. Allen Norris and Dr. Juleen China to see him although there was not emergent need.  They  both gracefully agreed with this consult.  Subjective: Patient was getting his Unna boot rewrapped when seen today.  Again very disturbing remarks stating that you are responsible for my current situation. Diarrhea resolved, now having formed stool with decreased dose of lactulose. Just want to see Dr. Allen Norris stating that he is the only right doctor here. I think he just want to see a white person.  Assessment & Plan:   Principal Problem:   Hepatic encephalopathy (HCC) Active Problems:   Cirrhosis (HCC)   Gastroesophageal reflux disease   Chronic venous stasis dermatitis of both lower extremities   Obesity, Class III, BMI 40-49.9 (morbid obesity) (HCC)   Barrett's esophagus   Type 2 diabetes mellitus with stage 3 chronic kidney disease (HCC)   Anxiety   Anemia of chronic disease  Hepatic encephalopathy.  Resolved.  Patient was not taking lactulose regularly stating that his nephew is not doing the job properly and not giving his medications on time.  Patient is alert and oriented but having very inappropriate demands. Seems like he was noncompliant with lactulose as he does not want to have bowel movements. Having multiple hospitalization with similar behavior and symptoms. -Rectal tube was replaced at his demand although not clinically indicated and might cause more damage to his raw anal area. -Decrease the dose of lactulose to 20 milligrams twice daily for his complaint of having diarrhea.  Told nursing staff to titrate for 2-3 soft bowel movements and back of he continued to have diarrhea. -Continue rifaximin -Palliative care consult. -GI consult-Dr.Wohl gracefully accepted this unreasonable consult, really appreciate his help.  Liver cirrhosis.  Patient has an history of liver cirrhosis with complications of severe portal gastropathy/esophageal varices and  pancytopenia. Appears volume up with 3+ lower extremity edema, stating that it is his baseline. -Continue with protein pump  inhibitor. -Continue with lactulose and rifaximin.  Chronic HFpEF.  Echocardiogram with low normal EF done in July 2021 with grade 2 diastolic dysfunction.  3+ lower extremity edema with signs of venous stasis and some weeping. -Continue with carvedilol and torsemide. -Wound care was also consulted and patient was rewrapped with Unna boot.  CKD stage IV.  Creatinine with some worsening to 2.87 . Patient was requesting nephrology consult. -Nephrology is following-appreciate their help. -Continue to monitor -Avoid nephrotoxins  Anemia of chronic disease.  Patient has mild pancytopenia most likely secondary to his liver disease.  Hemoglobin at 7.6 today.  Baseline hemoglobin between 7-8.   -Continue to monitor.  Anxiety disorder. -Continue with as needed lorazepam  Morbid obesity. Body mass index is 44.28 kg/m.  -This will complicate overall prognosis.  Diabetes mellitus.  CBG improved today. -Continue Lantus to 10 units twice daily. -Continue with sliding scale. -Continue 3 units with meals.  Objective: Vitals:   08/05/20 0152 08/05/20 0600 08/05/20 0804 08/05/20 1112  BP: (!) 146/66 (!) 153/58 (!) 158/55 (!) 151/49  Pulse: (!) 54 (!) 55 (!) 55 (!) 55  Resp: 18 18 16 16   Temp: 98.4 F (36.9 C) 98.6 F (37 C) 97.9 F (36.6 C) 98.2 F (36.8 C)  TempSrc: Oral Oral Oral Oral  SpO2: 99% 97% 98% 97%  Weight:      Height:        Intake/Output Summary (Last 24 hours) at 08/05/2020 1551 Last data filed at 08/05/2020 1412 Gross per 24 hour  Intake 600 ml  Output 201 ml  Net 399 ml   Filed Weights   08/01/20 0627 08/01/20 1730  Weight: 136 kg 136 kg    Examination:  General.  Morbidly obese gentleman, in no acute distress. Pulmonary.  Lungs clear bilaterally, normal respiratory effort. CV.  Regular rate and rhythm, no JVD, rub or murmur. Abdomen.  Soft, nontender, nondistended, BS positive. CNS.  Alert and oriented x3.  No focal neurologic deficit. Extremities.  2+  LE edema, Unna boot in place, pulses intact and symmetrical. Psychiatry.  Judgment and insight appears normal.  DVT prophylaxis: SCDs Code Status: Full Family Communication: Discussed with patient.  Open APS case, does not want to involve nephew. Disposition Plan:  Status is: Inpatient  Remains inpatient appropriate because:Inpatient level of care appropriate due to severity of illness   Dispo: The patient is from: Home              Anticipated d/c is to: SNF              Anticipated d/c date is: 2 days              Patient currently is medically stable.  High risk for readmission, deterioration and death because of being noncompliant and behavioral issues.  OT recommended SNF placement.  TOC working on it.   Consultants:   Nephrology  Palliative care  GI  Procedures:  Antimicrobials:   Data Reviewed: I have personally reviewed following labs and imaging studies  CBC: Recent Labs  Lab 08/01/20 0635 08/01/20 0635 08/02/20 0434 08/03/20 0514 08/03/20 0832 08/04/20 0420 08/05/20 0650  WBC 3.7*   < > 2.4* 1.6* 1.8* 2.1* 1.8*  NEUTROABS 3.0  --   --   --   --   --   --   HGB 7.8*   < > 7.4*  6.7* 7.7* 7.4* 7.6*  HCT 24.2*   < > 23.6* 21.6* 24.0* 23.3* 24.4*  MCV 94.5   < > 94.4 95.2 94.5 94.0 93.8  PLT 82*   < > 74* 63* 70* 63* 59*   < > = values in this interval not displayed.   Basic Metabolic Panel: Recent Labs  Lab 08/01/20 0635 08/02/20 0434 08/03/20 0514 08/04/20 0420 08/05/20 0650  NA 143 143 144 140 140  K 3.9 4.1 3.7 4.1 4.1  CL 109 108 108 104 106  CO2 24 25 26 27 26   GLUCOSE 187* 229* 127* 162* 108*  BUN 92* 84* 76* 72* 66*  CREATININE 2.67* 2.62* 2.57* 2.67* 2.87*  CALCIUM 8.5* 8.6* 8.4* 8.2* 8.4*  PHOS  --   --  3.6 3.2 3.3   GFR: Estimated Creatinine Clearance: 33.7 mL/min (A) (by C-G formula based on SCr of 2.87 mg/dL (H)). Liver Function Tests: Recent Labs  Lab 08/01/20 0635 08/03/20 0514 08/04/20 0420 08/05/20 0650  AST 30  --    --   --   ALT 20  --   --   --   ALKPHOS 78  --   --   --   BILITOT 1.1  --   --   --   PROT 6.4*  --   --   --   ALBUMIN 2.9* 2.5* 2.6* 2.5*   No results for input(s): LIPASE, AMYLASE in the last 168 hours. Recent Labs  Lab 08/01/20 0841  AMMONIA 109*   Coagulation Profile: Recent Labs  Lab 08/01/20 0841  INR 1.4*   Cardiac Enzymes: No results for input(s): CKTOTAL, CKMB, CKMBINDEX, TROPONINI in the last 168 hours. BNP (last 3 results) No results for input(s): PROBNP in the last 8760 hours. HbA1C: No results for input(s): HGBA1C in the last 72 hours. CBG: Recent Labs  Lab 08/04/20 1728 08/04/20 2035 08/05/20 0802 08/05/20 1152 08/05/20 1549  GLUCAP 160* 145* 97 180* 151*   Lipid Profile: No results for input(s): CHOL, HDL, LDLCALC, TRIG, CHOLHDL, LDLDIRECT in the last 72 hours. Thyroid Function Tests: No results for input(s): TSH, T4TOTAL, FREET4, T3FREE, THYROIDAB in the last 72 hours. Anemia Panel: Recent Labs    08/03/20 0832  VITAMINB12 949*  FOLATE 13.3  FERRITIN 70  TIBC 291  IRON 57  RETICCTPCT 1.7   Sepsis Labs: No results for input(s): PROCALCITON, LATICACIDVEN in the last 168 hours.  Recent Results (from the past 240 hour(s))  Urine Culture     Status: Abnormal   Collection Time: 07/29/20 10:46 AM   Specimen: Urine, Random  Result Value Ref Range Status   Specimen Description   Final    URINE, RANDOM Performed at [facility], [address] [place], [place] [address]    Special Requests   Final    NONE Performed at [facility], [address] [place], [place] [address]    Culture MULTIPLE SPECIES PRESENT, SUGGEST RECOLLECTION (A)  Final   Report Status 07/30/2020 FINAL  Final  Respiratory Panel by RT PCR (Flu A&B, Covid) - Nasopharyngeal Swab     Status: None   Collection Time: 08/01/20  9:23 AM   Specimen: Nasopharyngeal Swab  Result Value Ref Range Status   SARS Coronavirus 2 by RT PCR NEGATIVE NEGATIVE Final     Comment: (NOTE) SARS-CoV-2 target nucleic acids are NOT DETECTED.  The SARS-CoV-2 RNA is generally detectable in upper respiratoy specimens during the acute phase of infection. The lowest concentration of  SARS-CoV-2 viral copies this assay can detect is 131 copies/mL. A negative result does not preclude SARS-Cov-2 infection and should not be used as the sole basis for treatment or other patient management decisions. A negative result may occur with  improper specimen collection/handling, submission of specimen other than nasopharyngeal swab, presence of viral mutation(s) within the areas targeted by this assay, and inadequate number of viral copies (<131 copies/mL). A negative result must be combined with clinical observations, patient history, and epidemiological information. The expected result is Negative.  Fact Sheet for Patients:  PinkCheek.be  Fact Sheet for Healthcare Providers:  GravelBags.it  This test is no t yet approved or cleared by the [place] FDA and  has been authorized for detection and/or diagnosis of SARS-CoV-2 by FDA under an Emergency Use Authorization (EUA). This EUA will remain  in effect (meaning this test can be used) for the duration of the COVID-19 declaration under Section 564(b)(1) of the Act, 21 U.S.C. section 360bbb-3(b)(1), unless the authorization is terminated or revoked sooner.     Influenza A by PCR NEGATIVE NEGATIVE Final   Influenza B by PCR NEGATIVE NEGATIVE Final    Comment: (NOTE) The Xpert Xpress SARS-CoV-2/FLU/RSV assay is intended as an aid in  the diagnosis of influenza from Nasopharyngeal swab specimens and  should not be used as a sole basis for treatment. Nasal washings and  aspirates are unacceptable for Xpert Xpress SARS-CoV-2/FLU/RSV  testing.  Fact Sheet for Patients: PinkCheek.be  Fact Sheet for Healthcare  Providers: GravelBags.it  This test is not yet approved or cleared by the [place] FDA and  has been authorized for detection and/or diagnosis of SARS-CoV-2 by  FDA under an Emergency Use Authorization (EUA). This EUA will remain  in effect (meaning this test can be used) for the duration of the  Covid-19 declaration under Section 564(b)(1) of the Act, 21  U.S.C. section 360bbb-3(b)(1), unless the authorization is  terminated or revoked. Performed at [facility], [address] [place], [place] [address]   Gastrointestinal Panel by PCR , Stool     Status: None   Collection Time: 08/04/20  1:45 PM   Specimen: Stool  Result Value Ref Range Status   Campylobacter species NOT DETECTED NOT DETECTED Final   Plesimonas shigelloides NOT DETECTED NOT DETECTED Final   Salmonella species NOT DETECTED NOT DETECTED Final   Yersinia enterocolitica NOT DETECTED NOT DETECTED Final   Vibrio species NOT DETECTED NOT DETECTED Final   Vibrio cholerae NOT DETECTED NOT DETECTED Final   Enteroaggregative E coli (EAEC) NOT DETECTED NOT DETECTED Final   Enteropathogenic E coli (EPEC) NOT DETECTED NOT DETECTED Final   Enterotoxigenic E coli (ETEC) NOT DETECTED NOT DETECTED Final   Shiga like toxin producing E coli (STEC) NOT DETECTED NOT DETECTED Final   Shigella/Enteroinvasive E coli (EIEC) NOT DETECTED NOT DETECTED Final   Cryptosporidium NOT DETECTED NOT DETECTED Final   Cyclospora cayetanensis NOT DETECTED NOT DETECTED Final   Entamoeba histolytica NOT DETECTED NOT DETECTED Final   Giardia lamblia NOT DETECTED NOT DETECTED Final   Adenovirus F40/41 NOT DETECTED NOT DETECTED Final   Astrovirus NOT DETECTED NOT DETECTED Final   Norovirus GI/GII NOT DETECTED NOT DETECTED Final   Rotavirus A NOT DETECTED NOT DETECTED Final   Sapovirus (I, II, IV, and V) NOT DETECTED NOT DETECTED Final    Comment: Performed at [facility], [address],  [place], [place] [address]     Radiology Studies: No results found.  Scheduled Meds: . atorvastatin  20 mg Oral QPM  . carvedilol  3.125 mg Oral BID WC  . dextromethorphan-guaiFENesin  1 tablet Oral BID  . ferrous sulfate  325 mg Oral Daily  . insulin aspart  0-15 Units Subcutaneous TID WC  . insulin aspart  0-5 Units Subcutaneous QHS  . insulin glargine  10 Units Subcutaneous QHS  . lactulose  25 g Oral BID  . pantoprazole  40 mg Oral BID  . potassium chloride SA  20 mEq Oral BID  . rifaximin  550 mg Oral BID  . sodium chloride flush  3 mL Intravenous Q12H  . tamsulosin  0.4 mg Oral QPC supper  . torsemide  100 mg Oral Daily   Continuous Infusions: . sodium chloride       LOS: 4 days   Time spent: 30 minutes.  Lorella Nimrod, MD [facility]  If 7PM-7AM, please contact night-coverage [email or web address]  08/05/2020, 3:51 PM   This record has been created using Systems analyst. Errors have been sought and corrected,but may not always be located. Such creation errors do not reflect on the standard of care.

## 2020-08-05 NOTE — Consult Note (Signed)
[place] Nurse wound follow up Unna boots applied by bedside RN.  No WOC needs at this time.  Will follow.  Domenic Moras MSN, RN, FNP-BC CWON Wound, Ostomy, Continence Nurse Pager [phone]

## 2020-08-05 NOTE — Progress Notes (Signed)
Physical Therapy Treatment Patient Details Name: Corey West MRN: [id number] DOB: 21-Apr-1951 Today's Date: 08/05/2020    History of Present Illness 69 y.o. 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  Comes back with confusion and issues with managing meds at home.    PT Comments    Pt laying in bed having recently been cleaned up post BM, reports this is only somewhat better but has still been very problematic.  He did agree to do some supine exercises but did not want to get up/move/walk because "any time I move some leaks out." Between exercises/reps we had discussions about future of care, pt contemplating hospice (told he has <1 year to live secondary to liver issues). Pt c/o pain in groin area, but otherwise participated relatively well - though most exercises required at least some (and at times a lot) of AAROM.  Follow Up Recommendations  SNF     Equipment Recommendations  None recommended by PT    Recommendations for Other Services       Precautions / Restrictions Precautions Precautions: None Restrictions Weight Bearing Restrictions: No    Mobility  Bed Mobility               General bed mobility comments: deferred secondary diarrhea, anxious about terminal liver issues  Transfers                    Ambulation/Gait                 Stairs             Wheelchair Mobility    Modified Rankin (Stroke  Patients Only)       Balance                                            Cognition Arousal/Alertness: Awake/alert Behavior During Therapy: Anxious Overall Cognitive Status: Within Functional Limits for tasks assessed                                        Exercises General Exercises - Lower Extremity Ankle Circles/Pumps: AROM;AAROM;15 reps;Both (more ROM available on R than L) Quad Sets: AROM;10 reps;Both Short Arc Quad: AAROM;AROM;10 reps;Both (lacks AROM TKE b/l) Heel Slides: 10 reps;AROM (c/o pain with most rest (knee and peri pain)) Hip ABduction/ADduction: AROM;AAROM;15 reps;Both Straight Leg Raises: AAROM;5 reps;Both    General Comments General comments (skin integrity, edema, etc.): Spent a lot of time listening to and interacting with pt about his fears of terminal liver issues, home vs hospice vs ALF, end of life anxiety, possibility of care      Pertinent Vitals/Pain Pain Assessment:  (groin/peri area soreness, otherwise "no real pain")    Home Living                      Prior Function  PT Goals (current goals can now be found in the care plan section) Progress towards PT goals: Not progressing toward goals - comment (pt anxious about care/future, groin pain)    Frequency    Min 2X/week      PT Plan Current plan remains appropriate    Co-evaluation              AM-PAC PT "6 Clicks" Mobility   Outcome Measure  Help needed turning from your back to your side while in a flat bed without using bedrails?: A Lot Help needed moving from lying on your back to sitting on the side of a flat bed without using bedrails?: A Lot Help needed moving to and from a bed to a chair (including a wheelchair)?: A Lot Help needed standing up from a chair using your arms (e.g., wheelchair or bedside chair)?: A Lot Help needed to walk in hospital room?: A Lot Help needed climbing 3-5 steps with a railing? : A  Lot 6 Click Score: 12    End of Session Equipment Utilized During Treatment: Gait belt Activity Tolerance: Patient tolerated treatment well Patient left: with bed alarm set;with call bell/phone within reach   PT Visit Diagnosis: Muscle weakness (generalized) (M62.81);Difficulty in walking, not elsewhere classified (R26.2)     Time: 2060-1561 PT Time Calculation (min) (ACUTE ONLY): 28 min  Charges:  $Therapeutic Exercise: 23-37 mins                     Kreg Shropshire, DPT 08/05/2020, 2:46 PM

## 2020-08-05 NOTE — TOC Progression Note (Addendum)
Transition of Care [facility]) - Progression Note    Patient Details  Name: Corey West MRN: [id number] Date of Birth: 1951-08-13  Transition of Care [facility]) CM/SW [place], LCSW Phone Number: 08/05/2020, 8:52 AM  Clinical Narrative:   CSW reached out to [facility] and requested they review bed request sent for patient as this is his preference. Resent clinicals per Tammy with [facility]'s request.    Expected Discharge Plan: [place] Barriers to Discharge: Continued Medical Work up  Expected Discharge Plan and Services Expected Discharge Plan: [place]   Discharge Planning Services: CM Consult Post Acute Care Choice: [place] arrangements for the past 2 months: Single Family Home                                       Social Determinants of Health (SDOH) Interventions    Readmission Risk Interventions Readmission Risk Prevention Plan 07/27/2020 06/09/2020 02/02/2020  Transportation Screening Complete Complete Complete  PCP or Specialist Appt within 3-5 Days - - Complete  HRI or [place] - - Complete  Social Work Consult for [place] Planning/Counseling - - Complete  Palliative Care Screening - - Not Applicable  Medication Review Press photographer) Complete Complete Complete  PCP or Specialist appointment within 3-5 days of discharge Complete Complete -  [place] or Home Care Consult Complete Complete -  SW Recovery Care/Counseling Consult Complete Complete -  SW Consult Not Complete Comments - - -  Palliative Care Screening Not Applicable Not Applicable -  [place] Complete Not Applicable -  Some recent data might be hidden

## 2020-08-06 DIAGNOSIS — R197 Diarrhea, unspecified: Secondary | ICD-10-CM

## 2020-08-06 LAB — RENAL FUNCTION PANEL
Albumin: 2.5 g/dL — ABNORMAL LOW (ref 3.5–5.0)
Anion gap: 10 (ref 5–15)
BUN: 66 mg/dL — ABNORMAL HIGH (ref 8–23)
CO2: 26 mmol/L (ref 22–32)
Calcium: 8.4 mg/dL — ABNORMAL LOW (ref 8.9–10.3)
Chloride: 105 mmol/L (ref 98–111)
Creatinine, Ser: 2.81 mg/dL — ABNORMAL HIGH (ref 0.61–1.24)
GFR, Estimated: 24 mL/min — ABNORMAL LOW (ref >60)
Glucose, Bld: 117 mg/dL — ABNORMAL HIGH (ref 70–99)
Phosphorus: 3.4 mg/dL (ref 2.5–4.6)
Potassium: 3.9 mmol/L (ref 3.5–5.1)
Sodium: 141 mmol/L (ref 135–145)

## 2020-08-06 LAB — GLUCOSE, CAPILLARY
Glucose-Capillary: 152 mg/dL — ABNORMAL HIGH (ref 70–99)
Glucose-Capillary: 144 mg/dL — ABNORMAL HIGH (ref 70–99)
Glucose-Capillary: 129 mg/dL — ABNORMAL HIGH (ref 70–99)
Glucose-Capillary: 128 mg/dL — ABNORMAL HIGH (ref 70–99)

## 2020-08-06 MED ORDER — NALOXONE HCL 0.4 MG/ML IJ SOLN
INTRAMUSCULAR | Status: AC
  Filled 2020-08-06: qty 1

## 2020-08-06 NOTE — Progress Notes (Signed)
PROGRESS NOTE    Corey West  HYQ:[id number] DOB: May 13, 1951 DOA: 08/01/2020 PCP: Leonel Ramsay, MD   Brief Narrative: Taken from H&P Corey West is a 69 y.o. male with medical history significant  forliver cirrhosis complicated by severe portal hypertensive gastropathy, hepatic encephalopathy and esophageal varices, chronic diastolic heart failure, chronic lower extremity venous stasis , type 2 diabetes mellitus with complications of stage III chronic kidney disease, hypertension, gastric antral vascular ectasias, chronic multifactorial anemia  with baseline hemoglobin of 7-8, generalized anxiety disorder, BPH,who was recently discharged from [facility] on 10/30/2021after he was evaluated for acute on chronic anemia.  He had an upper endoscopy which showed severe portal hypertensive gastropathy as well as esophageal mucosal changes secondary to established Barrett's disease in the lower third of the esophagus.  Palliative care was recommended during his last hospitalization which he declined. He was brought in today by EMS for evaluation of confusion patient stated that his nephew who was supposed to be helping him at home is not taking care of him and is not administering his medications as prescribed. Patient is noted to have elevated ammonia level at 109. Multiple hospitalizations for similar situations, generalized weakness, concern of GI bleed, worsening renal function and anasarca.  Palliative care was declined initially and he was referred to outpatient palliative care. Refusing to take lactulose without rectal tube which was placed due to his demand as he was becoming very nasty when trying to explain risk and benefits of rectal tube and why he does not need it. Also demanding to see his GI and nephrologist stating that you cannot take care of me because you are not a specialist-asked Dr. Allen Norris and Dr. Juleen China to see him although there was not emergent need.  They  both gracefully agreed with this consult.  Subjective: Patient was resting comfortably when seen today.  Does not want to get discharged.  Apparently had couple of loose bowel movements overnight and rectal tube was reinserted according to his demand.  Assessment & Plan:   Principal Problem:   Hepatic encephalopathy (HCC) Active Problems:   Cirrhosis (HCC)   Gastroesophageal reflux disease   Chronic venous stasis dermatitis of both lower extremities   Obesity, Class III, BMI 40-49.9 (morbid obesity) (HCC)   Barrett's esophagus   Type 2 diabetes mellitus with stage 3 chronic kidney disease (HCC)   Anxiety   Anemia of chronic disease  Hepatic encephalopathy.  Resolved.  Patient was not taking lactulose regularly stating that his nephew is not doing the job properly and not giving his medications on time.  Patient is alert and oriented but having very inappropriate demands. Seems like he was noncompliant with lactulose as he does not want to have bowel movements. Having multiple hospitalization with similar behavior and symptoms. -Rectal tube was reinserted at his demand although not clinically indicated and might cause more damage to his raw anal area. -Decrease the dose of lactulose to 20 milligrams twice daily for his complaint of having diarrhea.  Told nursing staff to titrate for 2-3 soft bowel movements and back of he continued to have diarrhea. -Continue rifaximin -Palliative care consult. -GI consult-appreciate their help.  Liver cirrhosis.  Patient has an history of liver cirrhosis with complications of severe portal gastropathy/esophageal varices and pancytopenia. Appears volume up with 3+ lower extremity edema, stating that it is his baseline. -Continue with protein pump inhibitor. -Continue with lactulose and rifaximin. -Appropriate for palliative care.  Chronic HFpEF.  Echocardiogram with  low normal EF done in July 2021 with grade 2 diastolic dysfunction.  3+ lower  extremity edema with signs of venous stasis and some weeping. -Continue with carvedilol and torsemide. -Wound care was also consulted and patient was rewrapped with Unna boot.  CKD stage IV.  Creatinine with some worsening to 2.87 . Patient was requesting nephrology consult. -Nephrology is following-appreciate their help. -Continue to monitor -Avoid nephrotoxins  Anemia of chronic disease.  Patient has mild pancytopenia most likely secondary to his liver disease.  Hemoglobin at 7.6 today.  Baseline hemoglobin between 7-8.   -Continue to monitor.  Anxiety disorder. -Continue with as needed lorazepam  Morbid obesity. Body mass index is 44.28 kg/m.  -This will complicate overall prognosis.  Diabetes mellitus.  CBG improved today. -Continue Lantus to 10 units twice daily. -Continue with sliding scale. -Continue 3 units with meals.  Objective: Vitals:   08/06/20 0409 08/06/20 0833 08/06/20 1155 08/06/20 1555  BP: (!) 122/57 (!) 169/89 (!) 137/55 (!) 152/65  Pulse: (!) 57 61 (!) 56 (!) 55  Resp: 17 20 18 18   Temp: 98.7 F (37.1 C) 98 F (36.7 C) 98.2 F (36.8 C) 98.9 F (37.2 C)  TempSrc: Oral Oral Oral Oral  SpO2: 98% 100% 98% 98%  Weight:      Height:        Intake/Output Summary (Last 24 hours) at 08/06/2020 1557 Last data filed at 08/06/2020 0435 Gross per 24 hour  Intake 240 ml  Output 500 ml  Net -260 ml   Filed Weights   08/01/20 0627 08/01/20 1730  Weight: 136 kg 136 kg    Examination:  General.  Morbidly obese gentleman, in no acute distress. Pulmonary.  Lungs clear bilaterally, normal respiratory effort. CV.  Regular rate and rhythm, no JVD, rub or murmur. Abdomen.  Soft, nontender, nondistended, BS positive. CNS.  Alert and oriented x3.  No focal neurologic deficit. Extremities.  2+ LE edema, Unna boot in place. Psychiatry.  Judgment and insight appears normal.  DVT prophylaxis: SCDs Code Status: Full Family Communication:   Open APS case, does  not want to involve nephew. Disposition Plan:  Status is: Inpatient  Remains inpatient appropriate because:Inpatient level of care appropriate due to severity of illness   Dispo: The patient is from: Home              Anticipated d/c is to: SNF              Anticipated d/c date is: 2 days              Patient currently is medically stable.  High risk for readmission, deterioration and death because of being noncompliant, behavioral issues and multiple comorbidities.  OT recommended SNF placement.  TOC working on it.   Consultants:   Nephrology  Palliative care  GI  Procedures:  Antimicrobials:   Data Reviewed: I have personally reviewed following labs and imaging studies  CBC: Recent Labs  Lab 08/01/20 0635 08/01/20 0635 08/02/20 0434 08/03/20 0514 08/03/20 0832 08/04/20 0420 08/05/20 0650  WBC 3.7*   < > 2.4* 1.6* 1.8* 2.1* 1.8*  NEUTROABS 3.0  --   --   --   --   --   --   HGB 7.8*   < > 7.4* 6.7* 7.7* 7.4* 7.6*  HCT 24.2*   < > 23.6* 21.6* 24.0* 23.3* 24.4*  MCV 94.5   < > 94.4 95.2 94.5 94.0 93.8  PLT 82*   < > 74*  63* 70* 63* 59*   < > = values in this interval not displayed.   Basic Metabolic Panel: Recent Labs  Lab 08/02/20 0434 08/03/20 0514 08/04/20 0420 08/05/20 0650 08/06/20 0515  NA 143 144 140 140 141  K 4.1 3.7 4.1 4.1 3.9  CL 108 108 104 106 105  CO2 25 26 27 26 26   GLUCOSE 229* 127* 162* 108* 117*  BUN 84* 76* 72* 66* 66*  CREATININE 2.62* 2.57* 2.67* 2.87* 2.81*  CALCIUM 8.6* 8.4* 8.2* 8.4* 8.4*  PHOS  --  3.6 3.2 3.3 3.4   GFR: Estimated Creatinine Clearance: 34.4 mL/min (A) (by C-G formula based on SCr of 2.81 mg/dL (H)). Liver Function Tests: Recent Labs  Lab 08/01/20 0635 08/03/20 0514 08/04/20 0420 08/05/20 0650 08/06/20 0515  AST 30  --   --   --   --   ALT 20  --   --   --   --   ALKPHOS 78  --   --   --   --   BILITOT 1.1  --   --   --   --   PROT 6.4*  --   --   --   --   ALBUMIN 2.9* 2.5* 2.6* 2.5* 2.5*   No  results for input(s): LIPASE, AMYLASE in the last 168 hours. Recent Labs  Lab 08/01/20 0841  AMMONIA 109*   Coagulation Profile: Recent Labs  Lab 08/01/20 0841  INR 1.4*   Cardiac Enzymes: No results for input(s): CKTOTAL, CKMB, CKMBINDEX, TROPONINI in the last 168 hours. BNP (last 3 results) No results for input(s): PROBNP in the last 8760 hours. HbA1C: No results for input(s): HGBA1C in the last 72 hours. CBG: Recent Labs  Lab 08/05/20 1549 08/05/20 2125 08/06/20 0932 08/06/20 1139 08/06/20 1553  GLUCAP 151* 118* 152* 144* 129*   Lipid Profile: No results for input(s): CHOL, HDL, LDLCALC, TRIG, CHOLHDL, LDLDIRECT in the last 72 hours. Thyroid Function Tests: No results for input(s): TSH, T4TOTAL, FREET4, T3FREE, THYROIDAB in the last 72 hours. Anemia Panel: No results for input(s): VITAMINB12, FOLATE, FERRITIN, TIBC, IRON, RETICCTPCT in the last 72 hours. Sepsis Labs: No results for input(s): PROCALCITON, LATICACIDVEN in the last 168 hours.  Recent Results (from the past 240 hour(s))  Urine Culture     Status: Abnormal   Collection Time: 07/29/20 10:46 AM   Specimen: Urine, Random  Result Value Ref Range Status   Specimen Description   Final    URINE, RANDOM Performed at [facility], [address] [place], [place] [address]    Special Requests   Final    NONE Performed at [facility], [address] [place], [place] [address]    Culture MULTIPLE SPECIES PRESENT, SUGGEST RECOLLECTION (A)  Final   Report Status 07/30/2020 FINAL  Final  Respiratory Panel by RT PCR (Flu A&B, Covid) - Nasopharyngeal Swab     Status: None   Collection Time: 08/01/20  9:23 AM   Specimen: Nasopharyngeal Swab  Result Value Ref Range Status   SARS Coronavirus 2 by RT PCR NEGATIVE NEGATIVE Final    Comment: (NOTE) SARS-CoV-2 target nucleic acids are NOT DETECTED.  The SARS-CoV-2 RNA is generally detectable in upper respiratoy specimens during the acute  phase of infection. The lowest concentration of SARS-CoV-2 viral copies this assay can detect is 131 copies/mL. A negative result does not preclude SARS-Cov-2 infection and should not be used as the sole basis for treatment or other patient  management decisions. A negative result may occur with  improper specimen collection/handling, submission of specimen other than nasopharyngeal swab, presence of viral mutation(s) within the areas targeted by this assay, and inadequate number of viral copies (<131 copies/mL). A negative result must be combined with clinical observations, patient history, and epidemiological information. The expected result is Negative.  Fact Sheet for Patients:  PinkCheek.be  Fact Sheet for Healthcare Providers:  GravelBags.it  This test is no t yet approved or cleared by the [place] FDA and  has been authorized for detection and/or diagnosis of SARS-CoV-2 by FDA under an Emergency Use Authorization (EUA). This EUA will remain  in effect (meaning this test can be used) for the duration of the COVID-19 declaration under Section 564(b)(1) of the Act, 21 U.S.C. section 360bbb-3(b)(1), unless the authorization is terminated or revoked sooner.     Influenza A by PCR NEGATIVE NEGATIVE Final   Influenza B by PCR NEGATIVE NEGATIVE Final    Comment: (NOTE) The Xpert Xpress SARS-CoV-2/FLU/RSV assay is intended as an aid in  the diagnosis of influenza from Nasopharyngeal swab specimens and  should not be used as a sole basis for treatment. Nasal washings and  aspirates are unacceptable for Xpert Xpress SARS-CoV-2/FLU/RSV  testing.  Fact Sheet for Patients: PinkCheek.be  Fact Sheet for Healthcare Providers: GravelBags.it  This test is not yet approved or cleared by the [place] FDA and  has been authorized for detection and/or diagnosis of  SARS-CoV-2 by  FDA under an Emergency Use Authorization (EUA). This EUA will remain  in effect (meaning this test can be used) for the duration of the  Covid-19 declaration under Section 564(b)(1) of the Act, 21  U.S.C. section 360bbb-3(b)(1), unless the authorization is  terminated or revoked. Performed at [facility], [address] [place], [place] [address]   Gastrointestinal Panel by PCR , Stool     Status: None   Collection Time: 08/04/20  1:45 PM   Specimen: Stool  Result Value Ref Range Status   Campylobacter species NOT DETECTED NOT DETECTED Final   Plesimonas shigelloides NOT DETECTED NOT DETECTED Final   Salmonella species NOT DETECTED NOT DETECTED Final   Yersinia enterocolitica NOT DETECTED NOT DETECTED Final   Vibrio species NOT DETECTED NOT DETECTED Final   Vibrio cholerae NOT DETECTED NOT DETECTED Final   Enteroaggregative E coli (EAEC) NOT DETECTED NOT DETECTED Final   Enteropathogenic E coli (EPEC) NOT DETECTED NOT DETECTED Final   Enterotoxigenic E coli (ETEC) NOT DETECTED NOT DETECTED Final   Shiga like toxin producing E coli (STEC) NOT DETECTED NOT DETECTED Final   Shigella/Enteroinvasive E coli (EIEC) NOT DETECTED NOT DETECTED Final   Cryptosporidium NOT DETECTED NOT DETECTED Final   Cyclospora cayetanensis NOT DETECTED NOT DETECTED Final   Entamoeba histolytica NOT DETECTED NOT DETECTED Final   Giardia lamblia NOT DETECTED NOT DETECTED Final   Adenovirus F40/41 NOT DETECTED NOT DETECTED Final   Astrovirus NOT DETECTED NOT DETECTED Final   Norovirus GI/GII NOT DETECTED NOT DETECTED Final   Rotavirus A NOT DETECTED NOT DETECTED Final   Sapovirus (I, II, IV, and V) NOT DETECTED NOT DETECTED Final    Comment: Performed at [facility], [address], [place], [place] [address]     Radiology Studies: No results found.  Scheduled Meds:  naloxone       atorvastatin  20 mg Oral QPM   carvedilol  3.125 mg Oral BID WC    dextromethorphan-guaiFENesin  1 tablet Oral  BID   ferrous sulfate  325 mg Oral Daily   insulin aspart  0-15 Units Subcutaneous TID WC   insulin aspart  0-5 Units Subcutaneous QHS   insulin glargine  10 Units Subcutaneous QHS   lactulose  25 g Oral BID   pantoprazole  40 mg Oral BID   potassium chloride SA  20 mEq Oral BID   rifaximin  550 mg Oral BID   sodium chloride flush  3 mL Intravenous Q12H   tamsulosin  0.4 mg Oral QPC supper   torsemide  100 mg Oral Daily   Continuous Infusions:  sodium chloride       LOS: 5 days   Time spent: 30 minutes.  Lorella Nimrod, MD [facility]  If 7PM-7AM, please contact night-coverage [email or web address]  08/06/2020, 3:57 PM   This record has been created using Systems analyst. Errors have been sought and corrected,but may not always be located. Such creation errors do not reflect on the standard of care.

## 2020-08-06 NOTE — Progress Notes (Signed)
[facility]  ROUNDING NOTE   Subjective:   Patient resting in bed, appears pleasant and comfortable. He states he is thinking about palliative care/ hospice options. He expresses his concerns that he is not able to take care of himself at home. Getting ready to eat lunch.  No reports of nausea or vomiting  Objective:  Vital signs in last 24 hours:  Temp:  [98 F (36.7 C)-98.7 F (37.1 C)] 98 F (36.7 C) (11/06 0833) Pulse Rate:  [55-61] 61 (11/06 0833) Resp:  [16-20] 20 (11/06 0833) BP: (122-169)/(49-89) 169/89 (11/06 0833) SpO2:  [96 %-100 %] 100 % (11/06 0833)  Weight change:  Filed Weights   08/01/20 0627 08/01/20 1730  Weight: 136 kg 136 kg    Intake/Output: I/O last 3 completed shifts: In: 600 [P.O.:600] Out: 701 [Urine:701]   Intake/Output this shift:  No intake/output data recorded.  Physical Exam: General: In no acute distress  Head:  Moist oral mucosal membranes  Eyes: Anicteric  Lungs:  Respirations symmetrical,unlabored, lungs diminished at the bases  Heart: S1S2, regular  Abdomen:  Soft, nontender, distended  Extremities: + peripheral edema.  Neurologic: Awake, alert, Oriented  Skin: Bilateral lower legs with dressing clean,dry and intact    Basic Metabolic Panel: Recent Labs  Lab 08/02/20 0434 08/02/20 0434 08/03/20 0514 08/03/20 0514 08/04/20 0420 08/05/20 0650 08/06/20 0515  NA 143  --  144  --  140 140 141  K 4.1  --  3.7  --  4.1 4.1 3.9  CL 108  --  108  --  104 106 105  CO2 25  --  26  --  27 26 26   GLUCOSE 229*  --  127*  --  162* 108* 117*  BUN 84*  --  76*  --  72* 66* 66*  CREATININE 2.62*  --  2.57*  --  2.67* 2.87* 2.81*  CALCIUM 8.6*   < > 8.4*   < > 8.2* 8.4* 8.4*  PHOS  --   --  3.6  --  3.2 3.3 3.4   < > = values in this interval not displayed.    Liver Function Tests: Recent Labs  Lab 08/01/20 0635 08/03/20 0514 08/04/20 0420 08/05/20 0650 08/06/20 0515  AST 30  --   --   --   --   ALT 20  --   --    --   --   ALKPHOS 78  --   --   --   --   BILITOT 1.1  --   --   --   --   PROT 6.4*  --   --   --   --   ALBUMIN 2.9* 2.5* 2.6* 2.5* 2.5*   No results for input(s): LIPASE, AMYLASE in the last 168 hours. Recent Labs  Lab 08/01/20 0841  AMMONIA 109*    CBC: Recent Labs  Lab 08/01/20 0635 08/01/20 0635 08/02/20 0434 08/03/20 0514 08/03/20 0832 08/04/20 0420 08/05/20 0650  WBC 3.7*   < > 2.4* 1.6* 1.8* 2.1* 1.8*  NEUTROABS 3.0  --   --   --   --   --   --   HGB 7.8*   < > 7.4* 6.7* 7.7* 7.4* 7.6*  HCT 24.2*   < > 23.6* 21.6* 24.0* 23.3* 24.4*  MCV 94.5   < > 94.4 95.2 94.5 94.0 93.8  PLT 82*   < > 74* 63* 70* 63* 59*   < > = values  in this interval not displayed.    Cardiac Enzymes: No results for input(s): CKTOTAL, CKMB, CKMBINDEX, TROPONINI in the last 168 hours.  BNP: Invalid input(s): POCBNP  CBG: Recent Labs  Lab 08/04/20 2035 08/05/20 0802 08/05/20 1152 08/05/20 1549 08/05/20 2125  GLUCAP 145* 97 180* 151* 118*    Microbiology: Results for orders placed or performed during the hospital encounter of 08/01/20  Respiratory Panel by RT PCR (Flu A&B, Covid) - Nasopharyngeal Swab     Status: None   Collection Time: 08/01/20  9:23 AM   Specimen: Nasopharyngeal Swab  Result Value Ref Range Status   SARS Coronavirus 2 by RT PCR NEGATIVE NEGATIVE Final    Comment: (NOTE) SARS-CoV-2 target nucleic acids are NOT DETECTED.  The SARS-CoV-2 RNA is generally detectable in upper respiratoy specimens during the acute phase of infection. The lowest concentration of SARS-CoV-2 viral copies this assay can detect is 131 copies/mL. A negative result does not preclude SARS-Cov-2 infection and should not be used as the sole basis for treatment or other patient management decisions. A negative result may occur with  improper specimen collection/handling, submission of specimen other than nasopharyngeal swab, presence of viral mutation(s) within the areas targeted by this  assay, and inadequate number of viral copies (<131 copies/mL). A negative result must be combined with clinical observations, patient history, and epidemiological information. The expected result is Negative.  Fact Sheet for Patients:  PinkCheek.be  Fact Sheet for Healthcare Providers:  GravelBags.it  This test is no t yet approved or cleared by the [place] FDA and  has been authorized for detection and/or diagnosis of SARS-CoV-2 by FDA under an Emergency Use Authorization (EUA). This EUA will remain  in effect (meaning this test can be used) for the duration of the COVID-19 declaration under Section 564(b)(1) of the Act, 21 U.S.C. section 360bbb-3(b)(1), unless the authorization is terminated or revoked sooner.     Influenza A by PCR NEGATIVE NEGATIVE Final   Influenza B by PCR NEGATIVE NEGATIVE Final    Comment: (NOTE) The Xpert Xpress SARS-CoV-2/FLU/RSV assay is intended as an aid in  the diagnosis of influenza from Nasopharyngeal swab specimens and  should not be used as a sole basis for treatment. Nasal washings and  aspirates are unacceptable for Xpert Xpress SARS-CoV-2/FLU/RSV  testing.  Fact Sheet for Patients: PinkCheek.be  Fact Sheet for Healthcare Providers: GravelBags.it  This test is not yet approved or cleared by the [place] FDA and  has been authorized for detection and/or diagnosis of SARS-CoV-2 by  FDA under an Emergency Use Authorization (EUA). This EUA will remain  in effect (meaning this test can be used) for the duration of the  Covid-19 declaration under Section 564(b)(1) of the Act, 21  U.S.C. section 360bbb-3(b)(1), unless the authorization is  terminated or revoked. Performed at [facility], [address] [place], [place] [address]   Gastrointestinal Panel by PCR , Stool     Status: None   Collection Time:  08/04/20  1:45 PM   Specimen: Stool  Result Value Ref Range Status   Campylobacter species NOT DETECTED NOT DETECTED Final   Plesimonas shigelloides NOT DETECTED NOT DETECTED Final   Salmonella species NOT DETECTED NOT DETECTED Final   Yersinia enterocolitica NOT DETECTED NOT DETECTED Final   Vibrio species NOT DETECTED NOT DETECTED Final   Vibrio cholerae NOT DETECTED NOT DETECTED Final   Enteroaggregative E coli (EAEC) NOT DETECTED NOT DETECTED Final   Enteropathogenic E coli (EPEC) NOT DETECTED  NOT DETECTED Final   Enterotoxigenic E coli (ETEC) NOT DETECTED NOT DETECTED Final   Shiga like toxin producing E coli (STEC) NOT DETECTED NOT DETECTED Final   Shigella/Enteroinvasive E coli (EIEC) NOT DETECTED NOT DETECTED Final   Cryptosporidium NOT DETECTED NOT DETECTED Final   Cyclospora cayetanensis NOT DETECTED NOT DETECTED Final   Entamoeba histolytica NOT DETECTED NOT DETECTED Final   Giardia lamblia NOT DETECTED NOT DETECTED Final   Adenovirus F40/41 NOT DETECTED NOT DETECTED Final   Astrovirus NOT DETECTED NOT DETECTED Final   Norovirus GI/GII NOT DETECTED NOT DETECTED Final   Rotavirus A NOT DETECTED NOT DETECTED Final   Sapovirus (I, II, IV, and V) NOT DETECTED NOT DETECTED Final    Comment: Performed at [facility], [address] [place], [place] [address]    Coagulation Studies: No results for input(s): LABPROT, INR in the last 72 hours.  Urinalysis: No results for input(s): COLORURINE, LABSPEC, PHURINE, GLUCOSEU, HGBUR, BILIRUBINUR, KETONESUR, PROTEINUR, UROBILINOGEN, NITRITE, LEUKOCYTESUR in the last 72 hours.  Invalid input(s): APPERANCEUR    Imaging: No results found.   Medications:   . sodium chloride     . atorvastatin  20 mg Oral QPM  . carvedilol  3.125 mg Oral BID WC  . dextromethorphan-guaiFENesin  1 tablet Oral BID  . ferrous sulfate  325 mg Oral Daily  . insulin aspart  0-15 Units Subcutaneous TID WC  . insulin aspart  0-5 Units  Subcutaneous QHS  . insulin glargine  10 Units Subcutaneous QHS  . lactulose  25 g Oral BID  . pantoprazole  40 mg Oral BID  . potassium chloride SA  20 mEq Oral BID  . rifaximin  550 mg Oral BID  . sodium chloride flush  3 mL Intravenous Q12H  . tamsulosin  0.4 mg Oral QPC supper  . torsemide  100 mg Oral Daily   sodium chloride, LORazepam, sodium chloride, sodium chloride flush  Assessment/ Plan:  [name] is a 69 y.o. white male with cirrhosis, esophageal varices, iron deficiency anemia, chronic venous stasis of both lower extremities, who is admitted to [facility] on 08/01/2020 for Hepatic encephalopathy ([place]) [K72.90]  1. Acute kidney injury on chronic kidney disease stage IV  Chronic kidney disease secondary to diabetic nephropathy Acute kidney injury secondary to acute hepatorenal syndrome Lab Results  Component Value Date   CREATININE 2.81 (H) 08/06/2020   CREATININE 2.87 (H) 08/05/2020   CREATININE 2.67 (H) 08/04/2020  Baseline creatinine of 2.7 as outpatient.  Peaked at 3.6 on October 27  Now improved to 2.8 and stabilizing AKI likely secondary to volume shifts leading to prerenal state and ATN Continue supportive care   2. Anemia with renal failure Patient received PRBC transfusion during previous recent admission Lab Results  Component Value Date   HGB 7.6 (L) 08/05/2020     3.Diabetes with CKD Patient is on Lantus Lab Results  Component Value Date   HGBA1C 5.7 (H) 05/19/2020  Management as per hospitalist team   LOS: [place] 11/6/20218:40 AM

## 2020-08-06 NOTE — Progress Notes (Signed)
Vonda Antigua, MD [address] [place], [place], [place], [address] [address], [place], [place], [place], [address] Phone: [phone]  Fax: [phone]   Subjective: Patient resting in bed comfortably.  No confusion.  No signs of bleeding.   Objective: Exam: Vital signs in last 24 hours: Vitals:   08/05/20 1953 08/06/20 0120 08/06/20 0409 08/06/20 0833  BP: (!) 149/59 (!) 151/56 (!) 122/57 (!) 169/89  Pulse: (!) 55 (!) 55 (!) 57 61  Resp: 17 17 17 20   Temp: 98.4 F (36.9 C) 98.6 F (37 C) 98.7 F (37.1 C) 98 F (36.7 C)  TempSrc: Oral Oral Oral Oral  SpO2: 96% 98% 98% 100%  Weight:      Height:       Weight change:   Intake/Output Summary (Last 24 hours) at 08/06/2020 1136 Last data filed at 08/06/2020 0435 Gross per 24 hour  Intake 360 ml  Output 500 ml  Net -140 ml    General: No acute distress, AAO x3 Abd: Soft, NT/ND, No HSM Skin: Warm, no rashes Neck: Supple, Trachea midline   Lab Results: Lab Results  Component Value Date   WBC 1.8 (L) 08/05/2020   HGB 7.6 (L) 08/05/2020   HCT 24.4 (L) 08/05/2020   MCV 93.8 08/05/2020   PLT 59 (L) 08/05/2020   Micro Results: Recent Results (from the past 240 hour(s))  Urine Culture     Status: Abnormal   Collection Time: 07/29/20 10:46 AM   Specimen: Urine, Random  Result Value Ref Range Status   Specimen Description   Final    URINE, RANDOM Performed at [facility], [address] [place], [place] [address]    Special Requests   Final    NONE Performed at [facility], [address] [place], [place] [address]    Culture MULTIPLE SPECIES PRESENT, SUGGEST RECOLLECTION (A)  Final   Report Status 07/30/2020 FINAL  Final  Respiratory Panel by RT PCR (Flu A&B, Covid) - Nasopharyngeal Swab     Status: None   Collection Time: 08/01/20  9:23 AM   Specimen: Nasopharyngeal Swab  Result Value Ref Range Status   SARS Coronavirus 2 by RT PCR NEGATIVE NEGATIVE Final    Comment:  (NOTE) SARS-CoV-2 target nucleic acids are NOT DETECTED.  The SARS-CoV-2 RNA is generally detectable in upper respiratoy specimens during the acute phase of infection. The lowest concentration of SARS-CoV-2 viral copies this assay can detect is 131 copies/mL. A negative result does not preclude SARS-Cov-2 infection and should not be used as the sole basis for treatment or other patient management decisions. A negative result may occur with  improper specimen collection/handling, submission of specimen other than nasopharyngeal swab, presence of viral mutation(s) within the areas targeted by this assay, and inadequate number of viral copies (<131 copies/mL). A negative result must be combined with clinical observations, patient history, and epidemiological information. The expected result is Negative.  Fact Sheet for Patients:  PinkCheek.be  Fact Sheet for Healthcare Providers:  GravelBags.it  This test is no t yet approved or cleared by the [place] FDA and  has been authorized for detection and/or diagnosis of SARS-CoV-2 by FDA under an Emergency Use Authorization (EUA). This EUA will remain  in effect (meaning this test can be used) for the duration of the COVID-19 declaration under Section 564(b)(1) of the Act, 21 U.S.C. section 360bbb-3(b)(1), unless the authorization is terminated or revoked sooner.     Influenza A by PCR NEGATIVE  NEGATIVE Final   Influenza B by PCR NEGATIVE NEGATIVE Final    Comment: (NOTE) The Xpert Xpress SARS-CoV-2/FLU/RSV assay is intended as an aid in  the diagnosis of influenza from Nasopharyngeal swab specimens and  should not be used as a sole basis for treatment. Nasal washings and  aspirates are unacceptable for Xpert Xpress SARS-CoV-2/FLU/RSV  testing.  Fact Sheet for Patients: PinkCheek.be  Fact Sheet for Healthcare  Providers: GravelBags.it  This test is not yet approved or cleared by the [place] FDA and  has been authorized for detection and/or diagnosis of SARS-CoV-2 by  FDA under an Emergency Use Authorization (EUA). This EUA will remain  in effect (meaning this test can be used) for the duration of the  Covid-19 declaration under Section 564(b)(1) of the Act, 21  U.S.C. section 360bbb-3(b)(1), unless the authorization is  terminated or revoked. Performed at [facility], [address] [place], [place] [address]   Gastrointestinal Panel by PCR , Stool     Status: None   Collection Time: 08/04/20  1:45 PM   Specimen: Stool  Result Value Ref Range Status   Campylobacter species NOT DETECTED NOT DETECTED Final   Plesimonas shigelloides NOT DETECTED NOT DETECTED Final   Salmonella species NOT DETECTED NOT DETECTED Final   Yersinia enterocolitica NOT DETECTED NOT DETECTED Final   Vibrio species NOT DETECTED NOT DETECTED Final   Vibrio cholerae NOT DETECTED NOT DETECTED Final   Enteroaggregative E coli (EAEC) NOT DETECTED NOT DETECTED Final   Enteropathogenic E coli (EPEC) NOT DETECTED NOT DETECTED Final   Enterotoxigenic E coli (ETEC) NOT DETECTED NOT DETECTED Final   Shiga like toxin producing E coli (STEC) NOT DETECTED NOT DETECTED Final   Shigella/Enteroinvasive E coli (EIEC) NOT DETECTED NOT DETECTED Final   Cryptosporidium NOT DETECTED NOT DETECTED Final   Cyclospora cayetanensis NOT DETECTED NOT DETECTED Final   Entamoeba histolytica NOT DETECTED NOT DETECTED Final   Giardia lamblia NOT DETECTED NOT DETECTED Final   Adenovirus F40/41 NOT DETECTED NOT DETECTED Final   Astrovirus NOT DETECTED NOT DETECTED Final   Norovirus GI/GII NOT DETECTED NOT DETECTED Final   Rotavirus A NOT DETECTED NOT DETECTED Final   Sapovirus (I, II, IV, and V) NOT DETECTED NOT DETECTED Final    Comment: Performed at [facility], [address],  [place], [place] [address]   Studies/Results: No results found. Medications:  Scheduled Meds: . atorvastatin  20 mg Oral QPM  . carvedilol  3.125 mg Oral BID WC  . dextromethorphan-guaiFENesin  1 tablet Oral BID  . ferrous sulfate  325 mg Oral Daily  . insulin aspart  0-15 Units Subcutaneous TID WC  . insulin aspart  0-5 Units Subcutaneous QHS  . insulin glargine  10 Units Subcutaneous QHS  . lactulose  25 g Oral BID  . pantoprazole  40 mg Oral BID  . potassium chloride SA  20 mEq Oral BID  . rifaximin  550 mg Oral BID  . sodium chloride flush  3 mL Intravenous Q12H  . tamsulosin  0.4 mg Oral QPC supper  . torsemide  100 mg Oral Daily   Continuous Infusions: . sodium chloride     PRN Meds:.sodium chloride, LORazepam, sodium chloride, sodium chloride flush   Assessment: Principal Problem:   Hepatic encephalopathy (HCC) Active Problems:   Cirrhosis (HCC)   Gastroesophageal reflux disease   Chronic venous stasis dermatitis of both lower extremities   Obesity, Class III, BMI 40-49.9 (morbid obesity) ([place])  Barrett's esophagus   Type 2 diabetes mellitus with stage 3 chronic kidney disease (HCC)   Anxiety   Anemia of chronic disease    Plan: No signs of encephalopathy  Titrate lactulose to avoid diarrhea.  Titrate to maintain 2-3 soft bowel movements a day.  Rule out C. difficile with stool testing if not already done   LOS: 5 days   Vonda Antigua, MD 08/06/2020, 11:36 AM

## 2020-08-07 LAB — GLUCOSE, CAPILLARY
Glucose-Capillary: 103 mg/dL — ABNORMAL HIGH (ref 70–99)
Glucose-Capillary: 102 mg/dL — ABNORMAL HIGH (ref 70–99)
Glucose-Capillary: 155 mg/dL — ABNORMAL HIGH (ref 70–99)
Glucose-Capillary: 122 mg/dL — ABNORMAL HIGH (ref 70–99)

## 2020-08-07 MED ORDER — TORSEMIDE 20 MG PO TABS
40.0000 mg | ORAL_TABLET | Freq: Every day | ORAL | Status: DC
  Administered 2020-08-07 – 2020-08-09 (×3): 40 mg via ORAL
  Filled 2020-08-07 (×4): qty 2

## 2020-08-07 NOTE — TOC Progression Note (Addendum)
Transition of Care [facility]) - Progression Note    Patient Details  Name: Corey West MRN: [id number] Date of Birth: [birth year]  Transition of [facility]) CM/SW Contact  Zigmund Daniel Dorian Pod, RN Phone Number: 08/07/2020, 1:11 PM  Clinical Narrative:    Pt with pt today concerning heart failure screening. Pt with understanding and indicates he was getting lower extremities with wrapping for ongoing swelling twice weekly. Pt is aware of his HF however contributes a lot of his symptoms to his liver problems. Discussion around HF related to swelling elevating his LE to reduce ongoing swelling and compression wraps. Pt with understanding with no additional needs at this time.  [facility] continue to be in pending status concerning acceptance. Will update team accordingly.  TOC will continue to follow.  Expected Discharge Plan: [place] Barriers to Discharge: Continued Medical Work up  Expected Discharge Plan and Services Expected Discharge Plan: [place]   Discharge Planning Services: CM Consult Post Acute Care Choice: [place] arrangements for the past 2 months: Single Family Home                                       Social Determinants of Health (SDOH) Interventions    Readmission Risk Interventions Readmission Risk Prevention Plan 07/27/2020 06/09/2020 02/02/2020  Transportation Screening Complete Complete Complete  PCP or Specialist Appt within 3-5 Days - - Complete  HRI or [place] - - Complete  Social Work Consult for [place] Planning/Counseling - - Complete  Palliative Care Screening - - Not Applicable  Medication Review Press photographer) Complete Complete Complete  PCP or Specialist appointment within 3-5 days of discharge Complete Complete -  [place] or Home Care Consult Complete Complete -  SW Recovery Care/Counseling Consult Complete Complete -  SW Consult Not Complete Comments - - -  Palliative Care Screening  Not Applicable Not Applicable -  [place] Complete Not Applicable -  Some recent data might be hidden

## 2020-08-07 NOTE — Progress Notes (Signed)
PROGRESS NOTE    Corey West  DPO:[id number] DOB: 1951-01-01 DOA: 08/01/2020 PCP: Leonel Ramsay, MD   Brief Narrative: Taken from H&P Corey West is a 69 y.o. male with medical history significant  forliver cirrhosis complicated by severe portal hypertensive gastropathy, hepatic encephalopathy and esophageal varices, chronic diastolic heart failure, chronic lower extremity venous stasis , type 2 diabetes mellitus with complications of stage III chronic kidney disease, hypertension, gastric antral vascular ectasias, chronic multifactorial anemia  with baseline hemoglobin of 7-8, generalized anxiety disorder, BPH,who was recently discharged from [facility] on [facility]/30/2021after he was evaluated for acute on chronic anemia.  He had an upper endoscopy which showed severe portal hypertensive gastropathy as well as esophageal mucosal changes secondary to established Barrett's disease in the lower third of the esophagus.  Palliative care was recommended during his last hospitalization which he declined. He was brought in today by EMS for evaluation of confusion patient stated that his nephew who was supposed to be helping him at home is not taking care of him and is not administering his medications as prescribed. Patient is noted to have elevated ammonia level at 109. Multiple hospitalizations for similar situations, generalized weakness, concern of GI bleed, worsening renal function and anasarca.  Palliative care was declined initially and he was referred to outpatient palliative care. Refusing to take lactulose without rectal tube which was placed due to his demand as he was becoming very nasty when trying to explain risk and benefits of rectal tube and why he does not need it. Also demanding to see his GI and nephrologist stating that you cannot take care of me because you are not a specialist-asked Dr. Allen Norris and Dr. Juleen China to see him although there was not emergent need.  They  both gracefully agreed with this consult.  Subjective: Patient with no new complaint.  Nursing concern of increased scrotal edema causing sleep and of condom catheter resulted in patient being soiled with urine most of the time requiring frequent cleaning and he was not letting them clean properly.  Patient refusing most of the time to get out of bed, staying in 1 position which is high risk for development of pressure injuries. Rectal tube was reinserted overnight at patient's request after being slipped off.  Bag with some watery bowel movements. Had a long discussion in the presence of nephrology to get out of bed and stay in recliner and try to walk some during the day.  Patient seems quite hesitant but agrees to give it a try if we can give him a bedpan with some sort of vacuum suctioning for his urine.  Assessment & Plan:   Principal Problem:   Hepatic encephalopathy (HCC) Active Problems:   Cirrhosis (HCC)   Gastroesophageal reflux disease   Chronic venous stasis dermatitis of both lower extremities   Obesity, Class III, BMI 40-49.9 (morbid obesity) (HCC)   Barrett's esophagus   Type 2 diabetes mellitus with stage 3 chronic kidney disease (HCC)   Anxiety   Anemia of chronic disease  Hepatic encephalopathy.  Resolved.  Patient was not taking lactulose regularly stating that his nephew is not doing the job properly and not giving his medications on time.  APS case is open  Patient is alert and oriented but having very inappropriate demands. Seems like he was noncompliant with lactulose as he does not want to have bowel movements. Having multiple hospitalization with similar behavior and symptoms. -Rectal tube was reinserted at his demand although  not clinically indicated and might cause more damage to his raw anal area. -Decrease the dose of lactulose to 20 milligrams twice daily for his complaint of having diarrhea.  Told nursing staff to titrate for 2-3 soft bowel movements and back  of he continued to have diarrhea. -Continue rifaximin -Palliative care consult. -GI consult-appreciate their help.  Liver cirrhosis.  Patient has an history of liver cirrhosis with complications of severe portal gastropathy/esophageal varices and pancytopenia. Appears volume up with 3+ lower extremity edema, stating that it is his baseline. -Continue with protein pump inhibitor. -Continue with lactulose and rifaximin. -Appropriate for palliative care.  Chronic HFpEF.  Echocardiogram with low normal EF done in July 2021 with grade 2 diastolic dysfunction.  3+ lower extremity edema with signs of venous stasis and some weeping.  Worsening scrotal edema today. -Nephrology increase torsemide to twice daily dose. -Continue with carvedilol  -Wound care was also consulted and patient was rewrapped with Unna boot.  CKD stage IV.  No blood work today as he refused. Patient was requesting nephrology consult. -Nephrology is following-appreciate their help. -Continue to monitor -Avoid nephrotoxins  Anemia of chronic disease.  Patient has mild pancytopenia most likely secondary to his liver disease.  Baseline hemoglobin between 7-8.   -Continue to monitor.  Anxiety disorder. -Continue with as needed lorazepam  Morbid obesity. Body mass index is 44.28 kg/m.  -This will complicate overall prognosis.  Diabetes mellitus.  CBG improved . -Continue Lantus to 10 units twice daily. -Continue with sliding scale. -Continue 3 units with meals.  Objective: Vitals:   08/06/20 2059 08/06/20 2354 08/07/20 0445 08/07/20 0800  BP: (!) 139/51 (!) 149/57 (!) 155/52 (!) 147/63  Pulse: (!) 55 (!) 57 (!) 56 62  Resp: 18 16 18 17   Temp: 98.6 F (37 C) 98.4 F (36.9 C) 97.9 F (36.6 C) 97.8 F (36.6 C)  TempSrc: Oral Oral Oral Oral  SpO2: 96% 97% 97% 98%  Weight:      Height:        Intake/Output Summary (Last 24 hours) at 08/07/2020 1401 Last data filed at 08/07/2020 0656 Gross per 24 hour  Intake  --  Output 400 ml  Net -400 ml   Filed Weights   08/01/20 0627 08/01/20 1730  Weight: 136 kg 136 kg    Examination:  General. In no acute distress. Pulmonary.  Lungs clear bilaterally, normal respiratory effort. CV.  Regular rate and rhythm, no JVD, rub or murmur. Abdomen.  Soft, nontender, nondistended, BS positive. CNS.  Alert and oriented x3.  No focal neurologic deficit. Extremities.  3+ LE edema, bilateral Unna boot in place, marked scrotal edema. Psychiatry.  Judgment and insight appears normal.  DVT prophylaxis: SCDs Code Status: Full Family Communication:   Open APS case, does not want to involve nephew. Disposition Plan:  Status is: Inpatient  Remains inpatient appropriate because:Inpatient level of care appropriate due to severity of illness   Dispo: The patient is from: Home              Anticipated d/c is to: SNF              Anticipated d/c date is: 2 days              Patient currently is medically stable.  High risk for readmission, deterioration and death because of being noncompliant, behavioral issues and multiple comorbidities.  OT recommended SNF placement.  TOC working on it.   Consultants:   Nephrology  Palliative care  GI  Procedures:  Antimicrobials:   Data Reviewed: I have personally reviewed following labs and imaging studies  CBC: Recent Labs  Lab 08/01/20 0635 08/01/20 0635 08/02/20 0434 08/03/20 0514 08/03/20 0832 08/04/20 0420 08/05/20 0650  WBC 3.7*   < > 2.4* 1.6* 1.8* 2.1* 1.8*  NEUTROABS 3.0  --   --   --   --   --   --   HGB 7.8*   < > 7.4* 6.7* 7.7* 7.4* 7.6*  HCT 24.2*   < > 23.6* 21.6* 24.0* 23.3* 24.4*  MCV 94.5   < > 94.4 95.2 94.5 94.0 93.8  PLT 82*   < > 74* 63* 70* 63* 59*   < > = values in this interval not displayed.   Basic Metabolic Panel: Recent Labs  Lab 08/02/20 0434 08/03/20 0514 08/04/20 0420 08/05/20 0650 08/06/20 0515  NA 143 144 140 140 141  K 4.1 3.7 4.1 4.1 3.9  CL 108 108 104 106 105   CO2 25 26 27 26 26   GLUCOSE 229* 127* 162* 108* 117*  BUN 84* 76* 72* 66* 66*  CREATININE 2.62* 2.57* 2.67* 2.87* 2.81*  CALCIUM 8.6* 8.4* 8.2* 8.4* 8.4*  PHOS  --  3.6 3.2 3.3 3.4   GFR: Estimated Creatinine Clearance: 34.4 mL/min (A) (by C-G formula based on SCr of 2.81 mg/dL (H)). Liver Function Tests: Recent Labs  Lab 08/01/20 0635 08/03/20 0514 08/04/20 0420 08/05/20 0650 08/06/20 0515  AST 30  --   --   --   --   ALT 20  --   --   --   --   ALKPHOS 78  --   --   --   --   BILITOT 1.1  --   --   --   --   PROT 6.4*  --   --   --   --   ALBUMIN 2.9* 2.5* 2.6* 2.5* 2.5*   No results for input(s): LIPASE, AMYLASE in the last 168 hours. Recent Labs  Lab 08/01/20 0841  AMMONIA 109*   Coagulation Profile: Recent Labs  Lab 08/01/20 0841  INR 1.4*   Cardiac Enzymes: No results for input(s): CKTOTAL, CKMB, CKMBINDEX, TROPONINI in the last 168 hours. BNP (last 3 results) No results for input(s): PROBNP in the last 8760 hours. HbA1C: No results for input(s): HGBA1C in the last 72 hours. CBG: Recent Labs  Lab 08/06/20 1139 08/06/20 1553 08/06/20 2058 08/07/20 0759 08/07/20 1128  GLUCAP 144* 129* 128* 103* 102*   Lipid Profile: No results for input(s): CHOL, HDL, LDLCALC, TRIG, CHOLHDL, LDLDIRECT in the last 72 hours. Thyroid Function Tests: No results for input(s): TSH, T4TOTAL, FREET4, T3FREE, THYROIDAB in the last 72 hours. Anemia Panel: No results for input(s): VITAMINB12, FOLATE, FERRITIN, TIBC, IRON, RETICCTPCT in the last 72 hours. Sepsis Labs: No results for input(s): PROCALCITON, LATICACIDVEN in the last 168 hours.  Recent Results (from the past 240 hour(s))  Urine Culture     Status: Abnormal   Collection Time: 07/29/20 10:46 AM   Specimen: Urine, Random  Result Value Ref Range Status   Specimen Description   Final    URINE, RANDOM Performed at [facility], [address], [place], [place] [address]    Special Requests   Final     NONE Performed at [facility], [address] [place],  [address]    Culture MULTIPLE SPECIES PRESENT, SUGGEST RECOLLECTION (A)  Final   Report Status 07/30/2020 FINAL  Final  Respiratory Panel by RT PCR (Flu A&B, Covid) - Nasopharyngeal Swab     Status: None   Collection Time: 08/01/20  9:23 AM   Specimen: Nasopharyngeal Swab  Result Value Ref Range Status   SARS Coronavirus 2 by RT PCR NEGATIVE NEGATIVE Final    Comment: (NOTE) SARS-CoV-2 target nucleic acids are NOT DETECTED.  The SARS-CoV-2 RNA is generally detectable in upper respiratoy specimens during the acute phase of infection. The lowest concentration of SARS-CoV-2 viral copies this assay can detect is 131 copies/mL. A negative result does not preclude SARS-Cov-2 infection and should not be used as the sole basis for treatment or other patient management decisions. A negative result may occur with  improper specimen collection/handling, submission of specimen other than nasopharyngeal swab, presence of viral mutation(s) within the areas targeted by this assay, and inadequate number of viral copies (<131 copies/mL). A negative result must be combined with clinical observations, patient history, and epidemiological information. The expected result is Negative.  Fact Sheet for Patients:  PinkCheek.be  Fact Sheet for Healthcare Providers:  GravelBags.it  This test is no t yet approved or cleared by the [place] FDA and  has been authorized for detection and/or diagnosis of SARS-CoV-2 by FDA under an Emergency Use Authorization (EUA). This EUA will remain  in effect (meaning this test can be used) for the duration of the COVID-19 declaration under Section 564(b)(1) of the Act, 21 U.S.C. section 360bbb-3(b)(1), unless the authorization is terminated or revoked sooner.     Influenza A by PCR NEGATIVE NEGATIVE Final   Influenza B by PCR  NEGATIVE NEGATIVE Final    Comment: (NOTE) The Xpert Xpress SARS-CoV-2/FLU/RSV assay is intended as an aid in  the diagnosis of influenza from Nasopharyngeal swab specimens and  should not be used as a sole basis for treatment. Nasal washings and  aspirates are unacceptable for Xpert Xpress SARS-CoV-2/FLU/RSV  testing.  Fact Sheet for Patients: PinkCheek.be  Fact Sheet for Healthcare Providers: GravelBags.it  This test is not yet approved or cleared by the [place] FDA and  has been authorized for detection and/or diagnosis of SARS-CoV-2 by  FDA under an Emergency Use Authorization (EUA). This EUA will remain  in effect (meaning this test can be used) for the duration of the  Covid-19 declaration under Section 564(b)(1) of the Act, 21  U.S.C. section 360bbb-3(b)(1), unless the authorization is  terminated or revoked. Performed at [facility], [address] [place], [place] [address]   Gastrointestinal Panel by PCR , Stool     Status: None   Collection Time: 08/04/20  1:45 PM   Specimen: Stool  Result Value Ref Range Status   Campylobacter species NOT DETECTED NOT DETECTED Final   Plesimonas shigelloides NOT DETECTED NOT DETECTED Final   Salmonella species NOT DETECTED NOT DETECTED Final   Yersinia enterocolitica NOT DETECTED NOT DETECTED Final   Vibrio species NOT DETECTED NOT DETECTED Final   Vibrio cholerae NOT DETECTED NOT DETECTED Final   Enteroaggregative E coli (EAEC) NOT DETECTED NOT DETECTED Final   Enteropathogenic E coli (EPEC) NOT DETECTED NOT DETECTED Final   Enterotoxigenic E coli (ETEC) NOT DETECTED NOT DETECTED Final   Shiga like toxin producing E coli (STEC) NOT DETECTED NOT DETECTED Final   Shigella/Enteroinvasive E coli (EIEC) NOT DETECTED NOT DETECTED Final   Cryptosporidium NOT DETECTED NOT DETECTED Final   Cyclospora cayetanensis NOT DETECTED NOT DETECTED Final   Entamoeba  histolytica NOT DETECTED NOT DETECTED Final  Giardia lamblia NOT DETECTED NOT DETECTED Final   Adenovirus F40/41 NOT DETECTED NOT DETECTED Final   Astrovirus NOT DETECTED NOT DETECTED Final   Norovirus GI/GII NOT DETECTED NOT DETECTED Final   Rotavirus A NOT DETECTED NOT DETECTED Final   Sapovirus (I, II, IV, and V) NOT DETECTED NOT DETECTED Final    Comment: Performed at [facility], [address], [place], [place] [address]     Radiology Studies: No results found.  Scheduled Meds: . atorvastatin  20 mg Oral QPM  . carvedilol  3.125 mg Oral BID WC  . dextromethorphan-guaiFENesin  1 tablet Oral BID  . ferrous sulfate  325 mg Oral Daily  . insulin aspart  0-15 Units Subcutaneous TID WC  . insulin aspart  0-5 Units Subcutaneous QHS  . insulin glargine  10 Units Subcutaneous QHS  . lactulose  25 g Oral BID  . pantoprazole  40 mg Oral BID  . potassium chloride SA  20 mEq Oral BID  . rifaximin  550 mg Oral BID  . sodium chloride flush  3 mL Intravenous Q12H  . tamsulosin  0.4 mg Oral QPC supper  . torsemide  100 mg Oral Daily  . torsemide  40 mg Oral Daily   Continuous Infusions: . sodium chloride       LOS: 6 days   Time spent: 30 minutes.  Lorella Nimrod, MD [facility]  If 7PM-7AM, please contact night-coverage [email or web address]  08/07/2020, 2:01 PM   This record has been created using Systems analyst. Errors have been sought and corrected,but may not always be located. Such creation errors do not reflect on the standard of care.

## 2020-08-07 NOTE — Progress Notes (Addendum)
[facility]  ROUNDING NOTE   Subjective:   Patient appears in no acute discomfort.He stays volume overloaded. Lengthy conversation at bedside today, with patient's primary RN,and  hospitalist regarding patient's mobility, management of urinary incontinence to prevent further infection of his wounds. Patient agrees to participate with physical therapy to assist him to the bedside recliner.   Objective:  Vital signs in last 24 hours:  Temp:  [97.8 F (36.6 C)-98.9 F (37.2 C)] 97.8 F (36.6 C) (11/07 0800) Pulse Rate:  [55-62] 62 (11/07 0800) Resp:  [16-18] 17 (11/07 0800) BP: (137-155)/(51-65) 147/63 (11/07 0800) SpO2:  [96 %-98 %] 98 % (11/07 0800)  Weight change:  Filed Weights   08/01/20 0627 08/01/20 1730  Weight: 136 kg 136 kg    Intake/Output: I/O last 3 completed shifts: In: 240 [P.O.:240] Out: 900 [Urine:900]   Intake/Output this shift:  No intake/output data recorded.  Physical Exam: General: Resting in bed, in no acute distress  Head:  Moist oral mucosal membranes  Eyes: Anicteric  Lungs:  Respirations symmetrical,unlabored, lungs diminished at the bases  Heart: S1S2, regular  Abdomen:  Soft, nontender, distended  Extremities: 2+ peripheral edema on upper legs and scrotum, lower legs wrapped   Neurologic: Oriented x 3, speech clear  Skin: Bilateral lower legs with dressing CDI,erythematous blisters + upper leg    Basic Metabolic Panel: Recent Labs  Lab 08/02/20 0434 08/02/20 0434 08/03/20 0514 08/03/20 0514 08/04/20 0420 08/05/20 0650 08/06/20 0515  NA 143  --  144  --  140 140 141  K 4.1  --  3.7  --  4.1 4.1 3.9  CL 108  --  108  --  104 106 105  CO2 25  --  26  --  27 26 26   GLUCOSE 229*  --  127*  --  162* 108* 117*  BUN 84*  --  76*  --  72* 66* 66*  CREATININE 2.62*  --  2.57*  --  2.67* 2.87* 2.81*  CALCIUM 8.6*   < > 8.4*   < > 8.2* 8.4* 8.4*  PHOS  --   --  3.6  --  3.2 3.3 3.4   < > = values in this interval not  displayed.    Liver Function Tests: Recent Labs  Lab 08/01/20 0635 08/03/20 0514 08/04/20 0420 08/05/20 0650 08/06/20 0515  AST 30  --   --   --   --   ALT 20  --   --   --   --   ALKPHOS 78  --   --   --   --   BILITOT 1.1  --   --   --   --   PROT 6.4*  --   --   --   --   ALBUMIN 2.9* 2.5* 2.6* 2.5* 2.5*   No results for input(s): LIPASE, AMYLASE in the last 168 hours. Recent Labs  Lab 08/01/20 0841  AMMONIA 109*    CBC: Recent Labs  Lab 08/01/20 0635 08/01/20 0635 08/02/20 0434 08/03/20 0514 08/03/20 0832 08/04/20 0420 08/05/20 0650  WBC 3.7*   < > 2.4* 1.6* 1.8* 2.1* 1.8*  NEUTROABS 3.0  --   --   --   --   --   --   HGB 7.8*   < > 7.4* 6.7* 7.7* 7.4* 7.6*  HCT 24.2*   < > 23.6* 21.6* 24.0* 23.3* 24.4*  MCV 94.5   < > 94.4 95.2 94.5 94.0  93.8  PLT 82*   < > 74* 63* 70* 63* 59*   < > = values in this interval not displayed.    Cardiac Enzymes: No results for input(s): CKTOTAL, CKMB, CKMBINDEX, TROPONINI in the last 168 hours.  BNP: Invalid input(s): POCBNP  CBG: Recent Labs  Lab 08/06/20 1139 08/06/20 1553 08/06/20 2058 08/07/20 0759 08/07/20 1128  GLUCAP 144* 129* 128* 103* 102*    Microbiology: Results for orders placed or performed during the hospital encounter of 08/01/20  Respiratory Panel by RT PCR (Flu A&B, Covid) - Nasopharyngeal Swab     Status: None   Collection Time: 08/01/20  9:23 AM   Specimen: Nasopharyngeal Swab  Result Value Ref Range Status   SARS Coronavirus 2 by RT PCR NEGATIVE NEGATIVE Final    Comment: (NOTE) SARS-CoV-2 target nucleic acids are NOT DETECTED.  The SARS-CoV-2 RNA is generally detectable in upper respiratoy specimens during the acute phase of infection. The lowest concentration of SARS-CoV-2 viral copies this assay can detect is 131 copies/mL. A negative result does not preclude SARS-Cov-2 infection and should not be used as the sole basis for treatment or other patient management decisions. A negative  result may occur with  improper specimen collection/handling, submission of specimen other than nasopharyngeal swab, presence of viral mutation(s) within the areas targeted by this assay, and inadequate number of viral copies (<131 copies/mL). A negative result must be combined with clinical observations, patient history, and epidemiological information. The expected result is Negative.  Fact Sheet for Patients:  PinkCheek.be  Fact Sheet for Healthcare Providers:  GravelBags.it  This test is no t yet approved or cleared by the [place] FDA and  has been authorized for detection and/or diagnosis of SARS-CoV-2 by FDA under an Emergency Use Authorization (EUA). This EUA will remain  in effect (meaning this test can be used) for the duration of the COVID-19 declaration under Section 564(b)(1) of the Act, 21 U.S.C. section 360bbb-3(b)(1), unless the authorization is terminated or revoked sooner.     Influenza A by PCR NEGATIVE NEGATIVE Final   Influenza B by PCR NEGATIVE NEGATIVE Final    Comment: (NOTE) The Xpert Xpress SARS-CoV-2/FLU/RSV assay is intended as an aid in  the diagnosis of influenza from Nasopharyngeal swab specimens and  should not be used as a sole basis for treatment. Nasal washings and  aspirates are unacceptable for Xpert Xpress SARS-CoV-2/FLU/RSV  testing.  Fact Sheet for Patients: PinkCheek.be  Fact Sheet for Healthcare Providers: GravelBags.it  This test is not yet approved or cleared by the [place] FDA and  has been authorized for detection and/or diagnosis of SARS-CoV-2 by  FDA under an Emergency Use Authorization (EUA). This EUA will remain  in effect (meaning this test can be used) for the duration of the  Covid-19 declaration under Section 564(b)(1) of the Act, 21  U.S.C. section 360bbb-3(b)(1), unless the authorization is   terminated or revoked. Performed at [facility], [address] [place], [place] [address]   Gastrointestinal Panel by PCR , Stool     Status: None   Collection Time: 08/04/20  1:45 PM   Specimen: Stool  Result Value Ref Range Status   Campylobacter species NOT DETECTED NOT DETECTED Final   Plesimonas shigelloides NOT DETECTED NOT DETECTED Final   Salmonella species NOT DETECTED NOT DETECTED Final   Yersinia enterocolitica NOT DETECTED NOT DETECTED Final   Vibrio species NOT DETECTED NOT DETECTED Final   Vibrio cholerae NOT DETECTED NOT DETECTED Final  Enteroaggregative E coli (EAEC) NOT DETECTED NOT DETECTED Final   Enteropathogenic E coli (EPEC) NOT DETECTED NOT DETECTED Final   Enterotoxigenic E coli (ETEC) NOT DETECTED NOT DETECTED Final   Shiga like toxin producing E coli (STEC) NOT DETECTED NOT DETECTED Final   Shigella/Enteroinvasive E coli (EIEC) NOT DETECTED NOT DETECTED Final   Cryptosporidium NOT DETECTED NOT DETECTED Final   Cyclospora cayetanensis NOT DETECTED NOT DETECTED Final   Entamoeba histolytica NOT DETECTED NOT DETECTED Final   Giardia lamblia NOT DETECTED NOT DETECTED Final   Adenovirus F40/41 NOT DETECTED NOT DETECTED Final   Astrovirus NOT DETECTED NOT DETECTED Final   Norovirus GI/GII NOT DETECTED NOT DETECTED Final   Rotavirus A NOT DETECTED NOT DETECTED Final   Sapovirus (I, II, IV, and V) NOT DETECTED NOT DETECTED Final    Comment: Performed at [facility], [address] [place], [place] [address]    Coagulation Studies: No results for input(s): LABPROT, INR in the last 72 hours.  Urinalysis: No results for input(s): COLORURINE, LABSPEC, PHURINE, GLUCOSEU, HGBUR, BILIRUBINUR, KETONESUR, PROTEINUR, UROBILINOGEN, NITRITE, LEUKOCYTESUR in the last 72 hours.  Invalid input(s): APPERANCEUR    Imaging: No results found.   Medications:   . sodium chloride     . atorvastatin  20 mg Oral QPM  . carvedilol  3.125 mg Oral  BID WC  . dextromethorphan-guaiFENesin  1 tablet Oral BID  . ferrous sulfate  325 mg Oral Daily  . insulin aspart  0-15 Units Subcutaneous TID WC  . insulin aspart  0-5 Units Subcutaneous QHS  . insulin glargine  10 Units Subcutaneous QHS  . lactulose  25 g Oral BID  . pantoprazole  40 mg Oral BID  . potassium chloride SA  20 mEq Oral BID  . rifaximin  550 mg Oral BID  . sodium chloride flush  3 mL Intravenous Q12H  . tamsulosin  0.4 mg Oral QPC supper  . torsemide  100 mg Oral Daily  . torsemide  40 mg Oral Daily   sodium chloride, LORazepam, sodium chloride, sodium chloride flush  Assessment/ Plan:  Corey West is a 69 y.o. white male with cirrhosis, esophageal varices, iron deficiency anemia, chronic venous stasis of both lower extremities, who is admitted to [facility] on 08/01/2020 for Hepatic encephalopathy ([place]) [K72.90]  # Acute kidney injury on chronic kidney disease stage IV    #Volume Overload Chronic kidney disease secondary to diabetic nephropathy Acute kidney injury secondary to acute hepatorenal syndrome Lab Results  Component Value Date   CREATININE 2.81 (H) 08/06/2020   CREATININE 2.87 (H) 08/05/2020   CREATININE 2.67 (H) 08/04/2020  Creatinine levels stay relatively stable  Volume overload + with upper leg and scrotal edema We will add torsemide 40 mg in the evening, in addition to the 100 mg daily dose  #Anemia with renal failure Patient received PRBC transfusion during previous recent admission Lab Results  Component Value Date   HGB 7.6 (L) 08/05/2020    Stays low, but stable No acute indication for PRBC transfusion  #.Diabetes with CKD Patient is on Lantus Lab Results  Component Value Date   HGBA1C 5.7 (H) 05/19/2020  Blood glucose readings within acceptable range He is on Insulin Aspart and Insulin Glargine   LOS: 6 Lemoine Goyne 11/7/202111:52 AM

## 2020-08-08 LAB — RENAL FUNCTION PANEL
Albumin: 2.4 g/dL — ABNORMAL LOW (ref 3.5–5.0)
Anion gap: 9 (ref 5–15)
BUN: 64 mg/dL — ABNORMAL HIGH (ref 8–23)
CO2: 27 mmol/L (ref 22–32)
Calcium: 8 mg/dL — ABNORMAL LOW (ref 8.9–10.3)
Chloride: 107 mmol/L (ref 98–111)
Creatinine, Ser: 2.83 mg/dL — ABNORMAL HIGH (ref 0.61–1.24)
GFR, Estimated: 24 mL/min — ABNORMAL LOW (ref >60)
Glucose, Bld: 105 mg/dL — ABNORMAL HIGH (ref 70–99)
Phosphorus: 3.3 mg/dL (ref 2.5–4.6)
Potassium: 3.6 mmol/L (ref 3.5–5.1)
Sodium: 143 mmol/L (ref 135–145)

## 2020-08-08 LAB — GLUCOSE, CAPILLARY
Glucose-Capillary: 106 mg/dL — ABNORMAL HIGH (ref 70–99)
Glucose-Capillary: 96 mg/dL (ref 70–99)
Glucose-Capillary: 103 mg/dL — ABNORMAL HIGH (ref 70–99)
Glucose-Capillary: 126 mg/dL — ABNORMAL HIGH (ref 70–99)
Glucose-Capillary: 134 mg/dL — ABNORMAL HIGH (ref 70–99)

## 2020-08-08 LAB — CBC
HCT: 23.4 % — ABNORMAL LOW (ref 39.0–52.0)
Hemoglobin: 7.4 g/dL — ABNORMAL LOW (ref 13.0–17.0)
MCH: 29.5 pg (ref 26.0–34.0)
MCHC: 31.6 g/dL (ref 30.0–36.0)
MCV: 93.2 fL (ref 80.0–100.0)
Platelets: 58 10*3/uL — ABNORMAL LOW (ref 150–400)
RBC: 2.51 MIL/uL — ABNORMAL LOW (ref 4.22–5.81)
RDW: 15.4 % (ref 11.5–15.5)
WBC: 1.9 10*3/uL — ABNORMAL LOW (ref 4.0–10.5)
nRBC: 0 % (ref 0.0–0.2)

## 2020-08-08 NOTE — Progress Notes (Signed)
[facility]  ROUNDING NOTE   Subjective:   Patient appears in no acute discomfort.He stays volume overloaded. Lengthy conversation at bedside on sunday, with patient's primary RN,and  hospitalist regarding patient's mobility, management of urinary incontinence to prevent further infection of his wounds. Patient agrees to participate with physical therapy to assist him to the bedside recliner.   Doing fair today 11/07 0701 - 11/08 0700 In: -  Out: 900 [Urine:900]    Objective:  Vital signs in last 24 hours:  Temp:  [97.5 F (36.4 C)-98.5 F (36.9 C)] 98.2 F (36.8 C) (11/08 1042) Pulse Rate:  [52-71] 54 (11/08 1042) Resp:  [16-20] 20 (11/08 1042) BP: (137-161)/(49-61) 137/49 (11/08 1042) SpO2:  [96 %-99 %] 97 % (11/08 1042)  Weight change:  Filed Weights   08/01/20 0627 08/01/20 1730  Weight: 136 kg 136 kg    Intake/Output: I/O last 3 completed shifts: In: -  Out: 1300 [Urine:1300]   Intake/Output this shift:  Total I/O In: 238 [P.O.:238] Out: -   Physical Exam: General: Resting in bed, in no acute distress  Head:  Moist oral mucosal membranes  Eyes: Anicteric  Lungs:  Respirations symmetrical,unlabored, lungs diminished at the bases  Heart: S1S2, regular  Abdomen:  Soft, nontender, distended  Extremities: 2+ peripheral edema on upper legs and scrotum, lower legs wrapped   Neurologic: Oriented x 3, speech clear  Skin: Bilateral lower legs with dressing CDI,erythematous blisters + upper leg    Basic Metabolic Panel: Recent Labs  Lab 08/03/20 0514 08/03/20 0514 08/04/20 0420 08/04/20 0420 08/05/20 0650 08/06/20 0515 08/08/20 0509  NA 144  --  140  --  140 141 143  K 3.7  --  4.1  --  4.1 3.9 3.6  CL 108  --  104  --  106 105 107  CO2 26  --  27  --  26 26 27   GLUCOSE 127*  --  162*  --  108* 117* 105*  BUN 76*  --  72*  --  66* 66* 64*  CREATININE 2.57*  --  2.67*  --  2.87* 2.81* 2.83*  CALCIUM 8.4*   < > 8.2*   < > 8.4* 8.4* 8.0*   PHOS 3.6  --  3.2  --  3.3 3.4 3.3   < > = values in this interval not displayed.    Liver Function Tests: Recent Labs  Lab 08/03/20 0514 08/04/20 0420 08/05/20 0650 08/06/20 0515 08/08/20 0509  ALBUMIN 2.5* 2.6* 2.5* 2.5* 2.4*   No results for input(s): LIPASE, AMYLASE in the last 168 hours. No results for input(s): AMMONIA in the last 168 hours.  CBC: Recent Labs  Lab 08/03/20 0514 08/03/20 0832 08/04/20 0420 08/05/20 0650 08/08/20 0509  WBC 1.6* 1.8* 2.1* 1.8* 1.9*  HGB 6.7* 7.7* 7.4* 7.6* 7.4*  HCT 21.6* 24.0* 23.3* 24.4* 23.4*  MCV 95.2 94.5 94.0 93.8 93.2  PLT 63* 70* 63* 59* 58*    Cardiac Enzymes: No results for input(s): CKTOTAL, CKMB, CKMBINDEX, TROPONINI in the last 168 hours.  BNP: Invalid input(s): POCBNP  CBG: Recent Labs  Lab 08/07/20 1602 08/07/20 2126 08/08/20 0059 08/08/20 0744 08/08/20 1041  GLUCAP 155* 122* 106* 96 103*    Microbiology: Results for orders placed or performed during the hospital encounter of 08/01/20  Respiratory Panel by RT PCR (Flu A&B, Covid) - Nasopharyngeal Swab     Status: None   Collection Time: 08/01/20  9:23 AM   Specimen: Nasopharyngeal Swab  Result Value Ref Range Status   SARS Coronavirus 2 by RT PCR NEGATIVE NEGATIVE Final    Comment: (NOTE) SARS-CoV-2 target nucleic acids are NOT DETECTED.  The SARS-CoV-2 RNA is generally detectable in upper respiratoy specimens during the acute phase of infection. The lowest concentration of SARS-CoV-2 viral copies this assay can detect is 131 copies/mL. A negative result does not preclude SARS-Cov-2 infection and should not be used as the sole basis for treatment or other patient management decisions. A negative result may occur with  improper specimen collection/handling, submission of specimen other than nasopharyngeal swab, presence of viral mutation(s) within the areas targeted by this assay, and inadequate number of viral copies (<131 copies/mL). A negative  result must be combined with clinical observations, patient history, and epidemiological information. The expected result is Negative.  Fact Sheet for Patients:  PinkCheek.be  Fact Sheet for Healthcare Providers:  GravelBags.it  This test is no t yet approved or cleared by the [place] FDA and  has been authorized for detection and/or diagnosis of SARS-CoV-2 by FDA under an Emergency Use Authorization (EUA). This EUA will remain  in effect (meaning this test can be used) for the duration of the COVID-19 declaration under Section 564(b)(1) of the Act, 21 U.S.C. section 360bbb-3(b)(1), unless the authorization is terminated or revoked sooner.     Influenza A by PCR NEGATIVE NEGATIVE Final   Influenza B by PCR NEGATIVE NEGATIVE Final    Comment: (NOTE) The Xpert Xpress SARS-CoV-2/FLU/RSV assay is intended as an aid in  the diagnosis of influenza from Nasopharyngeal swab specimens and  should not be used as a sole basis for treatment. Nasal washings and  aspirates are unacceptable for Xpert Xpress SARS-CoV-2/FLU/RSV  testing.  Fact Sheet for Patients: PinkCheek.be  Fact Sheet for Healthcare Providers: GravelBags.it  This test is not yet approved or cleared by the [place] FDA and  has been authorized for detection and/or diagnosis of SARS-CoV-2 by  FDA under an Emergency Use Authorization (EUA). This EUA will remain  in effect (meaning this test can be used) for the duration of the  Covid-19 declaration under Section 564(b)(1) of the Act, 21  U.S.C. section 360bbb-3(b)(1), unless the authorization is  terminated or revoked. Performed at [facility], [address] [place], [place] [address]   Gastrointestinal Panel by PCR , Stool     Status: None   Collection Time: 08/04/20  1:45 PM   Specimen: Stool  Result Value Ref Range Status    Campylobacter species NOT DETECTED NOT DETECTED Final   Plesimonas shigelloides NOT DETECTED NOT DETECTED Final   Salmonella species NOT DETECTED NOT DETECTED Final   Yersinia enterocolitica NOT DETECTED NOT DETECTED Final   Vibrio species NOT DETECTED NOT DETECTED Final   Vibrio cholerae NOT DETECTED NOT DETECTED Final   Enteroaggregative E coli (EAEC) NOT DETECTED NOT DETECTED Final   Enteropathogenic E coli (EPEC) NOT DETECTED NOT DETECTED Final   Enterotoxigenic E coli (ETEC) NOT DETECTED NOT DETECTED Final   Shiga like toxin producing E coli (STEC) NOT DETECTED NOT DETECTED Final   Shigella/Enteroinvasive E coli (EIEC) NOT DETECTED NOT DETECTED Final   Cryptosporidium NOT DETECTED NOT DETECTED Final   Cyclospora cayetanensis NOT DETECTED NOT DETECTED Final   Entamoeba histolytica NOT DETECTED NOT DETECTED Final   Giardia lamblia NOT DETECTED NOT DETECTED Final   Adenovirus F40/41 NOT DETECTED NOT DETECTED Final   Astrovirus NOT DETECTED NOT DETECTED Final   Norovirus GI/GII NOT DETECTED NOT DETECTED  Final   Rotavirus A NOT DETECTED NOT DETECTED Final   Sapovirus (I, II, IV, and V) NOT DETECTED NOT DETECTED Final    Comment: Performed at [facility], [address] [place], [place] [address]    Coagulation Studies: No results for input(s): LABPROT, INR in the last 72 hours.  Urinalysis: No results for input(s): COLORURINE, LABSPEC, PHURINE, GLUCOSEU, HGBUR, BILIRUBINUR, KETONESUR, PROTEINUR, UROBILINOGEN, NITRITE, LEUKOCYTESUR in the last 72 hours.  Invalid input(s): APPERANCEUR    Imaging: No results found.   Medications:   . sodium chloride     . atorvastatin  20 mg Oral QPM  . carvedilol  3.125 mg Oral BID WC  . dextromethorphan-guaiFENesin  1 tablet Oral BID  . ferrous sulfate  325 mg Oral Daily  . insulin aspart  0-15 Units Subcutaneous TID WC  . insulin aspart  0-5 Units Subcutaneous QHS  . insulin glargine  10 Units Subcutaneous QHS  . lactulose   25 g Oral BID  . pantoprazole  40 mg Oral BID  . potassium chloride SA  20 mEq Oral BID  . rifaximin  550 mg Oral BID  . sodium chloride flush  3 mL Intravenous Q12H  . tamsulosin  0.4 mg Oral QPC supper  . torsemide  100 mg Oral Daily  . torsemide  40 mg Oral Daily   sodium chloride, LORazepam, sodium chloride, sodium chloride flush  Assessment/ Plan:  [name] is a 69 y.o. white male with cirrhosis, esophageal varices, iron deficiency anemia, chronic venous stasis of both lower extremities, who is admitted to [facility] on 08/01/2020 for Hepatic encephalopathy ([place]) [K72.90]  # Acute kidney injury on chronic kidney disease stage IV    #Volume Overload Chronic kidney disease secondary to diabetic nephropathy Acute kidney injury secondary to acute hepatorenal syndrome Lab Results  Component Value Date   CREATININE 2.83 (H) 08/08/2020   CREATININE 2.81 (H) 08/06/2020   CREATININE 2.87 (H) 08/05/2020  Creatinine levels stay relatively stable  Volume overload + with upper leg and scrotal edema Continue  torsemide 40 mg in the evening, in addition to the 100 mg daily dose  #Anemia with renal failure Patient received PRBC transfusion during previous recent admission Lab Results  Component Value Date   HGB 7.4 (L) 08/08/2020    Stays low, but stable No acute indication for PRBC transfusion  #.Diabetes type 2 with CKD Patient is on Lantus Lab Results  Component Value Date   HGBA1C 5.7 (H) 05/19/2020  Blood glucose readings within acceptable range Insulin dependent   LOS: 7 Corey West 11/8/20212:44 PM

## 2020-08-08 NOTE — TOC Progression Note (Addendum)
Transition of Care [facility]) - Progression Note    Patient Details  Name: Corey West MRN: [id number] Date of Birth: [birth year]  [facility] CM/SW [place], LCSW Phone Number: 08/08/2020, 10:51 AM  Clinical Narrative:   Bed offer at patient's preferred SNF, [facility] Cari Caraway to patient. Patient says he is not ready to discharge. Says he is "too sick." Explained that per Dr. Reesa Chew, patient is medically ready for discharge. Patient kept asking about home services and says he has not heard from his APS Worker who was supposed to set up home services. Explained that patient could return home with [facility] RN, PT, OT, and Aide if he prefers. He reported he is agreeable to go to [facility], but wants to "wait until tomorrow morning to decide." CSW updated MD. Per MD, new palliative consult in as patient would be hospice appropriate if agreeable. Asked MD to talk to patient about being medically ready for discharge, will follow up after Palliative discussion.    Expected Discharge Plan: [place] Barriers to Discharge: Continued Medical Work up  Expected Discharge Plan and Services Expected Discharge Plan: [place]   Discharge Planning Services: CM Consult Post Acute Care Choice: [place] arrangements for the past 2 months: Single Family Home                                       Social Determinants of Health (SDOH) Interventions    Readmission Risk Interventions Readmission Risk Prevention Plan 07/27/2020 06/09/2020 02/02/2020  Transportation Screening Complete Complete Complete  PCP or Specialist Appt within 3-5 Days - - Complete  HRI or [place] - - Complete  Social Work Consult for [place] Planning/Counseling - - Complete  Palliative Care Screening - - Not Applicable  Medication Review Press photographer) Complete Complete Complete  PCP or Specialist appointment within 3-5  days of discharge Complete Complete -  [place] or Home Care Consult Complete Complete -  SW Recovery Care/Counseling Consult Complete Complete -  SW Consult Not Complete Comments - - -  Palliative Care Screening Not Applicable Not Applicable -  [place] Complete Not Applicable -  Some recent data might be hidden

## 2020-08-08 NOTE — Progress Notes (Signed)
PROGRESS NOTE    Corey West  FBP:[id number] DOB: 11-19-1950 DOA: 08/01/2020 PCP: Corey Ramsay, MD   Brief Narrative: Taken from H&P Corey West is a 69 y.o. male with medical history significant  forliver cirrhosis complicated by severe portal hypertensive gastropathy, hepatic encephalopathy and esophageal varices, chronic diastolic heart failure, chronic lower extremity venous stasis , type 2 diabetes mellitus with complications of stage III chronic kidney disease, hypertension, gastric antral vascular ectasias, chronic multifactorial anemia  with baseline hemoglobin of 7-8, generalized anxiety disorder, BPH,who was recently discharged from [facility] on 10/30/2021after he was evaluated for acute on chronic anemia.  He had an upper endoscopy which showed severe portal hypertensive gastropathy as well as esophageal mucosal changes secondary to established Barrett's disease in the lower third of the esophagus.  Palliative care was recommended during his last hospitalization which he declined. He was brought in today by EMS for evaluation of confusion patient stated that his nephew who was supposed to be helping him at home is not taking care of him and is not administering his medications as prescribed. Patient is noted to have elevated ammonia level at 109. Multiple hospitalizations for similar situations, generalized weakness, concern of GI bleed, worsening renal function and anasarca.  Palliative care was declined initially and he was referred to outpatient palliative care. Refusing to take lactulose without rectal tube which was placed due to his demand as he was becoming very nasty when trying to explain risk and benefits of rectal tube and why he does not need it. Also demanding to see his GI and nephrologist stating that you cannot take care of me because you are not a specialist-asked Dr. Allen Norris and Dr. Juleen China to see him although there was not emergent need.  They  both gracefully agreed with this consult.  Subjective: Patient continued to feel weak.  Stating that he was able to get to a chair yesterday for very short.  Of time and then came back to bed as he needs to pee.  Does not think that he still feeling better enough to go to a rehab facility.  Assessment & Plan:   Principal Problem:   Hepatic encephalopathy (HCC) Active Problems:   Cirrhosis (HCC)   Gastroesophageal reflux disease   Chronic venous stasis dermatitis of both lower extremities   Obesity, Class III, BMI 40-49.9 (morbid obesity) (HCC)   Barrett's esophagus   Type 2 diabetes mellitus with stage 3 chronic kidney disease (HCC)   Anxiety   Anemia of chronic disease  Hepatic encephalopathy.  Resolved.  Patient was not taking lactulose regularly stating that his nephew is not doing the job properly and not giving his medications on time.  APS case is open  Patient is alert and oriented but having very inappropriate demands. Seems like he was noncompliant with lactulose as he does not want to have bowel movements. Having multiple hospitalization with similar behavior and symptoms. -Rectal tube was reinserted at his demand although not clinically indicated and might cause more damage to his raw anal area. -Decrease the dose of lactulose to 20 milligrams twice daily for his complaint of having diarrhea.  Told nursing staff to titrate for 2-3 soft bowel movements and back of he continued to have diarrhea. -Continue rifaximin -Palliative care consult. -GI consult-appreciate their help.  Liver cirrhosis.  Patient has an history of liver cirrhosis with complications of severe portal gastropathy/esophageal varices and pancytopenia. Appears volume up with 3+ lower extremity edema, stating that it  is his baseline. -Continue with protein pump inhibitor. -Continue with lactulose and rifaximin. -Appropriate for palliative care.  Chronic HFpEF.  Echocardiogram with low normal EF done in July  2021 with grade 2 diastolic dysfunction.  3+ lower extremity edema with signs of venous stasis and some weeping.  Worsening scrotal edema today. -Continue with 100 mg of torsemide in the morning and 40 mg in the afternoon. -Continue with carvedilol  -Wound care was also consulted and patient was rewrapped with Unna boot.  CKD stage IV.  No blood work today as he refused. Patient was requesting nephrology consult. -Nephrology is following-appreciate their help. -Continue to monitor -Avoid nephrotoxins  Anemia of chronic disease.  Patient has mild pancytopenia most likely secondary to his liver disease.  Baseline hemoglobin between 7-8.   -Continue to monitor.  Anxiety disorder. -Continue with as needed lorazepam  Morbid obesity. Body mass index is 44.28 kg/m.  -This will complicate overall prognosis.  Diabetes mellitus.  CBG improved . -Continue Lantus to 10 units twice daily. -Continue with sliding scale. -Continue 3 units with meals.  Objective: Vitals:   08/08/20 0549 08/08/20 0742 08/08/20 1042 08/08/20 1606  BP: (!) 150/57 (!) 160/52 (!) 137/49 (!) 162/56  Pulse: (!) 57 (!) 55 (!) 54 (!) 55  Resp: 19 17 20 20   Temp: 98.4 F (36.9 C) (!) 97.5 F (36.4 C) 98.2 F (36.8 C) 98.5 F (36.9 C)  TempSrc:    Oral  SpO2: 97% 96% 97% 99%  Weight:      Height:        Intake/Output Summary (Last 24 hours) at 08/08/2020 1714 Last data filed at 08/08/2020 1429 Gross per 24 hour  Intake 238 ml  Output 500 ml  Net -262 ml   Filed Weights   08/01/20 0627 08/01/20 1730  Weight: 136 kg 136 kg    Examination:  General. In no acute distress. Pulmonary.  Lungs clear bilaterally, normal respiratory effort. CV.  Regular rate and rhythm, no JVD, rub or murmur. Abdomen.  Soft, nontender, nondistended, BS positive. CNS.  Alert and oriented x3.  No focal neurologic deficit. Extremities.  3+ LE edema, bilateral Unna boot in place, marked scrotal edema. Psychiatry.  Judgment and  insight appears normal.  DVT prophylaxis: SCDs Code Status: Full Family Communication:   Open APS case, does not want to involve nephew. Disposition Plan:  Status is: Inpatient  Remains inpatient appropriate because:Inpatient level of care appropriate due to severity of illness   Dispo: The patient is from: Home              Anticipated d/c is to: SNF              Anticipated d/c date is: 2 days              Patient currently is medically stable.  High risk for readmission, deterioration and death because of being noncompliant, behavioral issues and multiple comorbidities.  OT recommended SNF placement.  TOC working on it.  Patient continued to feel weak.  Ask palliative care again to reevaluate him as he was declining hospice services before.  Patient is appropriate for hospice services.   Consultants:   Nephrology  Palliative care  GI  Procedures:  Antimicrobials:   Data Reviewed: I have personally reviewed following labs and imaging studies  CBC: Recent Labs  Lab 08/03/20 0514 08/03/20 0832 08/04/20 0420 08/05/20 0650 08/08/20 0509  WBC 1.6* 1.8* 2.1* 1.8* 1.9*  HGB 6.7* 7.7* 7.4* 7.6* 7.4*  HCT 21.6* 24.0* 23.3* 24.4* 23.4*  MCV 95.2 94.5 94.0 93.8 93.2  PLT 63* 70* 63* 59* 58*   Basic Metabolic Panel: Recent Labs  Lab 08/03/20 0514 08/04/20 0420 08/05/20 0650 08/06/20 0515 08/08/20 0509  NA 144 140 140 141 143  K 3.7 4.1 4.1 3.9 3.6  CL 108 104 106 105 107  CO2 26 27 26 26 27   GLUCOSE 127* 162* 108* 117* 105*  BUN 76* 72* 66* 66* 64*  CREATININE 2.57* 2.67* 2.87* 2.81* 2.83*  CALCIUM 8.4* 8.2* 8.4* 8.4* 8.0*  PHOS 3.6 3.2 3.3 3.4 3.3   GFR: Estimated Creatinine Clearance: 34.2 mL/min (A) (by C-G formula based on SCr of 2.83 mg/dL (H)). Liver Function Tests: Recent Labs  Lab 08/03/20 0514 08/04/20 0420 08/05/20 0650 08/06/20 0515 08/08/20 0509  ALBUMIN 2.5* 2.6* 2.5* 2.5* 2.4*   No results for input(s): LIPASE, AMYLASE in the last 168  hours. No results for input(s): AMMONIA in the last 168 hours. Coagulation Profile: No results for input(s): INR, PROTIME in the last 168 hours. Cardiac Enzymes: No results for input(s): CKTOTAL, CKMB, CKMBINDEX, TROPONINI in the last 168 hours. BNP (last 3 results) No results for input(s): PROBNP in the last 8760 hours. HbA1C: No results for input(s): HGBA1C in the last 72 hours. CBG: Recent Labs  Lab 08/07/20 2126 08/08/20 0059 08/08/20 0744 08/08/20 1041 08/08/20 1607  GLUCAP 122* 106* 96 103* 126*   Lipid Profile: No results for input(s): CHOL, HDL, LDLCALC, TRIG, CHOLHDL, LDLDIRECT in the last 72 hours. Thyroid Function Tests: No results for input(s): TSH, T4TOTAL, FREET4, T3FREE, THYROIDAB in the last 72 hours. Anemia Panel: No results for input(s): VITAMINB12, FOLATE, FERRITIN, TIBC, IRON, RETICCTPCT in the last 72 hours. Sepsis Labs: No results for input(s): PROCALCITON, LATICACIDVEN in the last 168 hours.  Recent Results (from the past 240 hour(s))  Respiratory Panel by RT PCR (Flu A&B, Covid) - Nasopharyngeal Swab     Status: None   Collection Time: 08/01/20  9:23 AM   Specimen: Nasopharyngeal Swab  Result Value Ref Range Status   SARS Coronavirus 2 by RT PCR NEGATIVE NEGATIVE Final    Comment: (NOTE) SARS-CoV-2 target nucleic acids are NOT DETECTED.  The SARS-CoV-2 RNA is generally detectable in upper respiratoy specimens during the acute phase of infection. The lowest concentration of SARS-CoV-2 viral copies this assay can detect is 131 copies/mL. A negative result does not preclude SARS-Cov-2 infection and should not be used as the sole basis for treatment or other patient management decisions. A negative result may occur with  improper specimen collection/handling, submission of specimen other than nasopharyngeal swab, presence of viral mutation(s) within the areas targeted by this assay, and inadequate number of viral copies (<131 copies/mL). A negative  result must be combined with clinical observations, patient history, and epidemiological information. The expected result is Negative.  Fact Sheet for Patients:  PinkCheek.be  Fact Sheet for Healthcare Providers:  GravelBags.it  This test is no t yet approved or cleared by the [place] FDA and  has been authorized for detection and/or diagnosis of SARS-CoV-2 by FDA under an Emergency Use Authorization (EUA). This EUA will remain  in effect (meaning this test can be used) for the duration of the COVID-19 declaration under Section 564(b)(1) of the Act, 21 U.S.C. section 360bbb-3(b)(1), unless the authorization is terminated or revoked sooner.     Influenza A by PCR NEGATIVE NEGATIVE Final   Influenza B by PCR NEGATIVE NEGATIVE Final    Comment: (  NOTE) The Xpert Xpress SARS-CoV-2/FLU/RSV assay is intended as an aid in  the diagnosis of influenza from Nasopharyngeal swab specimens and  should not be used as a sole basis for treatment. Nasal washings and  aspirates are unacceptable for Xpert Xpress SARS-CoV-2/FLU/RSV  testing.  Fact Sheet for Patients: PinkCheek.be  Fact Sheet for Healthcare Providers: GravelBags.it  This test is not yet approved or cleared by the [place] FDA and  has been authorized for detection and/or diagnosis of SARS-CoV-2 by  FDA under an Emergency Use Authorization (EUA). This EUA will remain  in effect (meaning this test can be used) for the duration of the  Covid-19 declaration under Section 564(b)(1) of the Act, 21  U.S.C. section 360bbb-3(b)(1), unless the authorization is  terminated or revoked. Performed at [facility], [address] [place], [place] [address]   Gastrointestinal Panel by PCR , Stool     Status: None   Collection Time: 08/04/20  1:45 PM   Specimen: Stool  Result Value Ref Range Status    Campylobacter species NOT DETECTED NOT DETECTED Final   Plesimonas shigelloides NOT DETECTED NOT DETECTED Final   Salmonella species NOT DETECTED NOT DETECTED Final   Yersinia enterocolitica NOT DETECTED NOT DETECTED Final   Vibrio species NOT DETECTED NOT DETECTED Final   Vibrio cholerae NOT DETECTED NOT DETECTED Final   Enteroaggregative E coli (EAEC) NOT DETECTED NOT DETECTED Final   Enteropathogenic E coli (EPEC) NOT DETECTED NOT DETECTED Final   Enterotoxigenic E coli (ETEC) NOT DETECTED NOT DETECTED Final   Shiga like toxin producing E coli (STEC) NOT DETECTED NOT DETECTED Final   Shigella/Enteroinvasive E coli (EIEC) NOT DETECTED NOT DETECTED Final   Cryptosporidium NOT DETECTED NOT DETECTED Final   Cyclospora cayetanensis NOT DETECTED NOT DETECTED Final   Entamoeba histolytica NOT DETECTED NOT DETECTED Final   Giardia lamblia NOT DETECTED NOT DETECTED Final   Adenovirus F40/41 NOT DETECTED NOT DETECTED Final   Astrovirus NOT DETECTED NOT DETECTED Final   Norovirus GI/GII NOT DETECTED NOT DETECTED Final   Rotavirus A NOT DETECTED NOT DETECTED Final   Sapovirus (I, II, IV, and V) NOT DETECTED NOT DETECTED Final    Comment: Performed at [facility], [address] [name], [place] [address]     Radiology Studies: No results found.  Scheduled Meds: . atorvastatin  20 mg Oral QPM  . carvedilol  3.125 mg Oral BID WC  . dextromethorphan-guaiFENesin  1 tablet Oral BID  . ferrous sulfate  325 mg Oral Daily  . insulin aspart  0-15 Units Subcutaneous TID WC  . insulin aspart  0-5 Units Subcutaneous QHS  . insulin glargine  10 Units Subcutaneous QHS  . lactulose  25 g Oral BID  . pantoprazole  40 mg Oral BID  . potassium chloride SA  20 mEq Oral BID  . rifaximin  550 mg Oral BID  . sodium chloride flush  3 mL Intravenous Q12H  . tamsulosin  0.4 mg Oral QPC supper  . torsemide  100 mg Oral Daily  . torsemide  40 mg Oral Daily   Continuous Infusions: . sodium  chloride       LOS: 7 days   Time spent: 30 minutes.  Lorella Nimrod, MD [facility]  If 7PM-7AM, please contact night-coverage [email or web address]  08/08/2020, 5:14 PM   This record has been created using Systems analyst. Errors have been sought and corrected,but may not always be located. Such creation errors do not reflect  on the standard of care.

## 2020-08-08 NOTE — Care Management Important Message (Signed)
Important Message  Patient Details  Name: Corey West MRN: [id number] Date of Birth: 22-Apr-1951   Medicare Important Message Given:  Yes     Corey West 08/08/2020, 11:34 AM

## 2020-08-09 DIAGNOSIS — N184 Chronic kidney disease, stage 4 (severe): Secondary | ICD-10-CM

## 2020-08-09 LAB — RENAL FUNCTION PANEL
Albumin: 2.4 g/dL — ABNORMAL LOW (ref 3.5–5.0)
Anion gap: 8 (ref 5–15)
BUN: 61 mg/dL — ABNORMAL HIGH (ref 8–23)
CO2: 28 mmol/L (ref 22–32)
Calcium: 7.9 mg/dL — ABNORMAL LOW (ref 8.9–10.3)
Chloride: 108 mmol/L (ref 98–111)
Creatinine, Ser: 2.8 mg/dL — ABNORMAL HIGH (ref 0.61–1.24)
GFR, Estimated: 24 mL/min — ABNORMAL LOW (ref >60)
Glucose, Bld: 107 mg/dL — ABNORMAL HIGH (ref 70–99)
Phosphorus: 3.5 mg/dL (ref 2.5–4.6)
Potassium: 4 mmol/L (ref 3.5–5.1)
Sodium: 144 mmol/L (ref 135–145)

## 2020-08-09 LAB — GLUCOSE, CAPILLARY
Glucose-Capillary: 98 mg/dL (ref 70–99)
Glucose-Capillary: 108 mg/dL — ABNORMAL HIGH (ref 70–99)
Glucose-Capillary: 118 mg/dL — ABNORMAL HIGH (ref 70–99)
Glucose-Capillary: 123 mg/dL — ABNORMAL HIGH (ref 70–99)

## 2020-08-09 NOTE — Progress Notes (Signed)
[facility] Liaison note; New referral for [employer]  outpatient Palliative program to follow at [facility] received from [facility]. Patient patient information given to referral. Thank you. Flo Shanks BSN, RN, [place] [phone]

## 2020-08-09 NOTE — Consult Note (Addendum)
[facility] Nurse wound follow up Pt had bilat Una boots to assist with minimizing edema. Removed and re-applied. Wound type:Right leg with resolving edema; no open wounds or drainage Left leg with resolving edema and healing full thickness wound to upper calf; 2X4X.1cm, dark red and dry Applied bilat Una boots and coban. [facility] team will plan to change Q Tues while in the hospital.  Pt will need assistance with weekly Una boot changes after discharge. Julien Girt MSN, RN, [place], [place], [place]

## 2020-08-09 NOTE — Progress Notes (Signed)
Pt finally agreeable to rectal tube removal. Rectal tube removed without difficulty. Pt reminded to call for assistance to get up to [facility] to have BM.

## 2020-08-09 NOTE — Progress Notes (Signed)
PROGRESS NOTE    Corey West  HEN:[id number] DOB: August 14, 1951 DOA: 08/01/2020 PCP: Leonel Ramsay, MD   Brief Narrative: Taken from H&P Corey West is a 69 y.o. male with medical history significant  forliver cirrhosis complicated by severe portal hypertensive gastropathy, hepatic encephalopathy and esophageal varices, chronic diastolic heart failure, chronic lower extremity venous stasis , type 2 diabetes mellitus with complications of stage III chronic kidney disease, hypertension, gastric antral vascular ectasias, chronic multifactorial anemia  with baseline hemoglobin of 7-8, generalized anxiety disorder, BPH,who was recently discharged from [facility] on 10/30/2021after he was evaluated for acute on chronic anemia.  He had an upper endoscopy which showed severe portal hypertensive gastropathy as well as esophageal mucosal changes secondary to established Barrett's disease in the lower third of the esophagus.  Palliative care was recommended during his last hospitalization which he declined. He was brought in today by EMS for evaluation of confusion patient stated that his nephew who was supposed to be helping him at home is not taking care of him and is not administering his medications as prescribed. Patient is noted to have elevated ammonia level at 109. Multiple hospitalizations for similar situations, generalized weakness, concern of GI bleed, worsening renal function and anasarca.  Palliative care was declined initially and he was referred to outpatient palliative care. Refusing to take lactulose without rectal tube which was placed due to his demand as he was becoming very nasty when trying to explain risk and benefits of rectal tube and why he does not need it. Also demanding to see his GI and nephrologist stating that you cannot take care of me because you are not a specialist-asked Dr. Allen Norris and Dr. Juleen China to see him although there was not emergent need.  They  both gracefully agreed with this consult. He is being followed up by GI and nephrology now, GI signed off today. Patient remains very difficult, medically stable to go to SNF but continued to refuse taking that rectal tube off and he cannot be discharged to SNF with rectal tube. Palliative saw him again at my request but he wants to continue current level of care. Discussed with niece [name] who was also concerned and upset with him as they are struggling with his care being noncompliant and very demanding with family also. She will try her best to ask him to take this rectal tube off and go to SNF.  Subjective: Patient continued to have some loose bowel movements and does not want to get rid of rectal tube. He does not think that is well enough to go to a rehab. Again stating that you are worse Dr. As you are not helping me. He was also upset that we did not test his stool as advised by Dr. Allen Norris, did not believe that its been tested and we did not find any abnormality.  Assessment & Plan:   Principal Problem:   Hepatic encephalopathy (HCC) Active Problems:   Cirrhosis (HCC)   Gastroesophageal reflux disease   Chronic venous stasis dermatitis of both lower extremities   Obesity, Class III, BMI 40-49.9 (morbid obesity) (HCC)   Barrett's esophagus   Type 2 diabetes mellitus with stage 3 chronic kidney disease (HCC)   Anxiety   Chronic kidney disease, stage 4 (severe) (HCC)   Anemia of chronic disease  Hepatic encephalopathy.  Resolved.  Patient was not taking lactulose regularly stating that his nephew is not doing the job properly and not giving his medications on  time.  APS case is open  Patient is alert and oriented but having very inappropriate demands. Seems like he was noncompliant with lactulose as he does not want to have bowel movements. Having multiple hospitalization with similar behavior and symptoms. -Rectal tube was reinserted at his demand although not clinically indicated and  might cause more damage to his raw anal area. -Decrease the dose of lactulose to 20 milligrams twice daily for his complaint of having diarrhea.  Told nursing staff to titrate for 2-3 soft bowel movements and back of he continued to have diarrhea. -Continue rifaximin -Palliative care consult. -GI consult-signed off today.  Liver cirrhosis.  Patient has an history of liver cirrhosis with complications of severe portal gastropathy/esophageal varices and pancytopenia. Appears volume up with 3+ lower extremity edema, stating that it is his baseline. -Continue with protein pump inhibitor. -Continue with lactulose and rifaximin. -Appropriate for palliative care.  Chronic HFpEF.  Echocardiogram with low normal EF done in July 2021 with grade 2 diastolic dysfunction.  3+ lower extremity edema with signs of venous stasis and some weeping.  Worsening scrotal edema today. -Continue with 100 mg of torsemide in the morning and 40 mg in the afternoon. -Continue with carvedilol  -Wound care was also consulted and patient was rewrapped with Unna boot.  CKD stage IV. Renal function stable. -Nephrology is following-appreciate their help. -Continue to monitor -Avoid nephrotoxins  Anemia of chronic disease.  Patient has mild pancytopenia most likely secondary to his liver disease.  Baseline hemoglobin between 7-8.   -Continue to monitor.  Anxiety disorder. -Continue with as needed lorazepam  Morbid obesity. Body mass index is 44.28 kg/m.  -This will complicate overall prognosis.  Diabetes mellitus.  CBG improved . -Continue Lantus to 10 units twice daily. -Continue with sliding scale. -Continue 3 units with meals.  Objective: Vitals:   08/09/20 0545 08/09/20 0843 08/09/20 0910 08/09/20 1153  BP: (!) 151/58 (!) 155/53 (!) 155/53 (!) 157/52  Pulse: (!) 53 (!) 56 (!) 54 (!) 53  Resp: 18 20 16 18   Temp: 98 F (36.7 C) 98 F (36.7 C) 98.7 F (37.1 C) 98.9 F (37.2 C)  TempSrc:  Oral  Oral   SpO2: 99% 98% 96% 97%  Weight:      Height:        Intake/Output Summary (Last 24 hours) at 08/09/2020 1458 Last data filed at 08/09/2020 1436 Gross per 24 hour  Intake 480 ml  Output --  Net 480 ml   Filed Weights   08/01/20 0627 08/01/20 1730  Weight: 136 kg 136 kg    Examination:  General. Unpleasant, morbidly obese gentleman in no acute distress. Pulmonary.  Lungs clear bilaterally, normal respiratory effort. CV.  Regular rate and rhythm, no JVD, rub or murmur. Abdomen.  Soft, nontender, nondistended, BS positive. CNS.  Alert and oriented x3.  No focal neurologic deficit. Extremities.  2+ LE edema, no cyanosis, pulses intact and symmetrical. Psychiatry.  Judgment and insight appears normal.  DVT prophylaxis: SCDs Code Status: Full Family Communication: Talked with niece [name] on phone. Disposition Plan:  Status is: Inpatient  Remains inpatient appropriate because:Inpatient level of care appropriate due to severity of illness   Dispo: The patient is from: Home              Anticipated d/c is to: SNF              Anticipated d/c date is: 2 days  Patient currently is medically stable.  High risk for readmission, deterioration and death because of being noncompliant, behavioral issues and multiple comorbidities.  OT recommended SNF placement.  TOC working on it.  Patient continued to feel weak.  Ask palliative care again to reevaluate him as he was declining hospice services before.  Patient is appropriate for hospice services. Patient has a bed offer and insurance authorization but refusing to take rectal tube out and he cannot be transferred to SNF with rectal tube.   Consultants:   Nephrology  Palliative care  GI  Procedures:  Antimicrobials:   Data Reviewed: I have personally reviewed following labs and imaging studies  CBC: Recent Labs  Lab 08/03/20 0514 08/03/20 0832 08/04/20 0420 08/05/20 0650 08/08/20 0509  WBC 1.6* 1.8* 2.1* 1.8*  1.9*  HGB 6.7* 7.7* 7.4* 7.6* 7.4*  HCT 21.6* 24.0* 23.3* 24.4* 23.4*  MCV 95.2 94.5 94.0 93.8 93.2  PLT 63* 70* 63* 59* 58*   Basic Metabolic Panel: Recent Labs  Lab 08/04/20 0420 08/05/20 0650 08/06/20 0515 08/08/20 0509 08/09/20 0519  NA 140 140 141 143 144  K 4.1 4.1 3.9 3.6 4.0  CL 104 106 105 107 108  CO2 27 26 26 27 28   GLUCOSE 162* 108* 117* 105* 107*  BUN 72* 66* 66* 64* 61*  CREATININE 2.67* 2.87* 2.81* 2.83* 2.80*  CALCIUM 8.2* 8.4* 8.4* 8.0* 7.9*  PHOS 3.2 3.3 3.4 3.3 3.5   GFR: Estimated Creatinine Clearance: 34.6 mL/min (A) (by C-G formula based on SCr of 2.8 mg/dL (H)). Liver Function Tests: Recent Labs  Lab 08/04/20 0420 08/05/20 0650 08/06/20 0515 08/08/20 0509 08/09/20 0519  ALBUMIN 2.6* 2.5* 2.5* 2.4* 2.4*   No results for input(s): LIPASE, AMYLASE in the last 168 hours. No results for input(s): AMMONIA in the last 168 hours. Coagulation Profile: No results for input(s): INR, PROTIME in the last 168 hours. Cardiac Enzymes: No results for input(s): CKTOTAL, CKMB, CKMBINDEX, TROPONINI in the last 168 hours. BNP (last 3 results) No results for input(s): PROBNP in the last 8760 hours. HbA1C: No results for input(s): HGBA1C in the last 72 hours. CBG: Recent Labs  Lab 08/08/20 1041 08/08/20 1607 08/08/20 2113 08/09/20 0912 08/09/20 1152  GLUCAP 103* 126* 134* 98 108*   Lipid Profile: No results for input(s): CHOL, HDL, LDLCALC, TRIG, CHOLHDL, LDLDIRECT in the last 72 hours. Thyroid Function Tests: No results for input(s): TSH, T4TOTAL, FREET4, T3FREE, THYROIDAB in the last 72 hours. Anemia Panel: No results for input(s): VITAMINB12, FOLATE, FERRITIN, TIBC, IRON, RETICCTPCT in the last 72 hours. Sepsis Labs: No results for input(s): PROCALCITON, LATICACIDVEN in the last 168 hours.  Recent Results (from the past 240 hour(s))  Respiratory Panel by RT PCR (Flu A&B, Covid) - Nasopharyngeal Swab     Status: None   Collection Time: 08/01/20   9:23 AM   Specimen: Nasopharyngeal Swab  Result Value Ref Range Status   SARS Coronavirus 2 by RT PCR NEGATIVE NEGATIVE Final    Comment: (NOTE) SARS-CoV-2 target nucleic acids are NOT DETECTED.  The SARS-CoV-2 RNA is generally detectable in upper respiratoy specimens during the acute phase of infection. The lowest concentration of SARS-CoV-2 viral copies this assay can detect is 131 copies/mL. A negative result does not preclude SARS-Cov-2 infection and should not be used as the sole basis for treatment or other patient management decisions. A negative result may occur with  improper specimen collection/handling, submission of specimen other than nasopharyngeal swab, presence of viral mutation(s) within  the areas targeted by this assay, and inadequate number of viral copies (<131 copies/mL). A negative result must be combined with clinical observations, patient history, and epidemiological information. The expected result is Negative.  Fact Sheet for Patients:  PinkCheek.be  Fact Sheet for Healthcare Providers:  GravelBags.it  This test is no t yet approved or cleared by the [place] FDA and  has been authorized for detection and/or diagnosis of SARS-CoV-2 by FDA under an Emergency Use Authorization (EUA). This EUA will remain  in effect (meaning this test can be used) for the duration of the COVID-19 declaration under Section 564(b)(1) of the Act, 21 U.S.C. section 360bbb-3(b)(1), unless the authorization is terminated or revoked sooner.     Influenza A by PCR NEGATIVE NEGATIVE Final   Influenza B by PCR NEGATIVE NEGATIVE Final    Comment: (NOTE) The Xpert Xpress SARS-CoV-2/FLU/RSV assay is intended as an aid in  the diagnosis of influenza from Nasopharyngeal swab specimens and  should not be used as a sole basis for treatment. Nasal washings and  aspirates are unacceptable for Xpert Xpress SARS-CoV-2/FLU/RSV   testing.  Fact Sheet for Patients: PinkCheek.be  Fact Sheet for Healthcare Providers: GravelBags.it  This test is not yet approved or cleared by the [place] FDA and  has been authorized for detection and/or diagnosis of SARS-CoV-2 by  FDA under an Emergency Use Authorization (EUA). This EUA will remain  in effect (meaning this test can be used) for the duration of the  Covid-19 declaration under Section 564(b)(1) of the Act, 21  U.S.C. section 360bbb-3(b)(1), unless the authorization is  terminated or revoked. Performed at [facility], [address] [place], [place] [address]   Gastrointestinal Panel by PCR , Stool     Status: None   Collection Time: 08/04/20  1:45 PM   Specimen: Stool  Result Value Ref Range Status   Campylobacter species NOT DETECTED NOT DETECTED Final   Plesimonas shigelloides NOT DETECTED NOT DETECTED Final   Salmonella species NOT DETECTED NOT DETECTED Final   Yersinia enterocolitica NOT DETECTED NOT DETECTED Final   Vibrio species NOT DETECTED NOT DETECTED Final   Vibrio cholerae NOT DETECTED NOT DETECTED Final   Enteroaggregative E coli (EAEC) NOT DETECTED NOT DETECTED Final   Enteropathogenic E coli (EPEC) NOT DETECTED NOT DETECTED Final   Enterotoxigenic E coli (ETEC) NOT DETECTED NOT DETECTED Final   Shiga like toxin producing E coli (STEC) NOT DETECTED NOT DETECTED Final   Shigella/Enteroinvasive E coli (EIEC) NOT DETECTED NOT DETECTED Final   Cryptosporidium NOT DETECTED NOT DETECTED Final   Cyclospora cayetanensis NOT DETECTED NOT DETECTED Final   Entamoeba histolytica NOT DETECTED NOT DETECTED Final   Giardia lamblia NOT DETECTED NOT DETECTED Final   Adenovirus F40/41 NOT DETECTED NOT DETECTED Final   Astrovirus NOT DETECTED NOT DETECTED Final   Norovirus GI/GII NOT DETECTED NOT DETECTED Final   Rotavirus A NOT DETECTED NOT DETECTED Final   Sapovirus (I, II, IV, and V)  NOT DETECTED NOT DETECTED Final    Comment: Performed at [facility], [address], [place], [place] [address]     Radiology Studies: No results found.  Scheduled Meds: . atorvastatin  20 mg Oral QPM  . carvedilol  3.125 mg Oral BID WC  . dextromethorphan-guaiFENesin  1 tablet Oral BID  . ferrous sulfate  325 mg Oral Daily  . insulin aspart  0-15 Units Subcutaneous TID WC  . insulin aspart  0-5 Units Subcutaneous QHS  .  insulin glargine  10 Units Subcutaneous QHS  . lactulose  25 g Oral BID  . pantoprazole  40 mg Oral BID  . potassium chloride SA  20 mEq Oral BID  . rifaximin  550 mg Oral BID  . sodium chloride flush  3 mL Intravenous Q12H  . tamsulosin  0.4 mg Oral QPC supper  . torsemide  100 mg Oral Daily  . torsemide  40 mg Oral Daily   Continuous Infusions: . sodium chloride       LOS: 8 days   Time spent: 30 minutes.  Lorella Nimrod, MD [facility]  If 7PM-7AM, please contact night-coverage [email or web address]  08/09/2020, 2:58 PM   This record has been created using Systems analyst. Errors have been sought and corrected,but may not always be located. Such creation errors do not reflect on the standard of care.

## 2020-08-09 NOTE — Progress Notes (Signed)
Physical Therapy Treatment Patient Details Name: Corey West MRN: [id number] DOB: June 02, 1951 Today's Date: 08/09/2020    History of Present Illness 69 y.o. 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  Comes back with confusion and issues with managing meds at home.    PT Comments    Pt initially reports not feeling like he can do a whole lot, but does agree that he needs to get out of the bed and do some activity.  He reports discomfort due to flexi-seal so refuses recliner post ambulation, but did manage to do some static standing/balance acts, ambulate to/from the door with slow and guarded gait as well as some supine exercises all with plenty of rest breaks, encouragement and constant cuing.  PT assisted with clean up as bed/goan was wet - pt showed good effort to assist with this clean up but again was very limited with how much he could do/tolerate.  Follow Up Recommendations  SNF     Equipment Recommendations  None recommended by PT    Recommendations for Other Services       Precautions / Restrictions Precautions Precautions: Fall Restrictions Weight Bearing Restrictions: No    Mobility  Bed Mobility Overal bed mobility: Needs Assistance Bed Mobility: Supine to Sit;Sit to Supine     Supine to sit: Min assist;Mod assist Sit to supine: Mod assist;Max assist   General bed mobility comments: Pt showed great effort with getting to EOB and  did not necessarily need a lot of assist to elevate but was very slow and needed excessive effort with transition to sitting  Transfers Overall transfer level: Needs assistance   Transfers: Sit to/from Stand Sit to Stand: Min assist;From elevated surface         General transfer comment: Pt was unable to rise with mod assist from standard height surfacem light assit with bed up ~3", definite reliance on walker to mainatin standing  Ambulation/Gait Ambulation/Gait assistance: Min guard Gait Distance (Feet): 35 Feet Assistive device: Rolling walker (2 wheeled)       General Gait Details: Pt able to ambulate with slow but consistent cadence to/from door.  Showed much less confidence, speed and tolerance than the last bout of ambulation with this PT last week.  Pt clearly weaker and more limited, reports significant fatigue with modest effort ("easily" did multiple in room loops with prolonged standing during clean up last time up)   Stairs             Wheelchair Mobility    Modified Rankin (Stroke Patients Only)       Balance Overall balance assessment: Modified Independent                                          Cognition Arousal/Alertness: Awake/alert Behavior During Therapy: Anxious Overall Cognitive Status: Within Functional Limits for tasks assessed  Exercises General Exercises - Lower Extremity Ankle Circles/Pumps: AROM;AAROM;15 reps;Both Heel Slides: AROM;10 reps (lightly resisted leg extensions b/l) Hip ABduction/ADduction: AROM;AAROM;15 reps;Both Straight Leg Raises: AROM;AAROM;10 reps    General Comments        Pertinent Vitals/Pain Pain Assessment:  (c/o mild groin/peri pain)    Home Living                      Prior Function            PT Goals (current goals can now be found in the care plan section) Progress towards PT goals: Progressing toward goals     Frequency    Min 2X/week      PT Plan Current plan remains appropriate    Co-evaluation              AM-PAC PT "6 Clicks" Mobility   Outcome Measure  Help needed turning from your back to your side while in a flat bed without using bedrails?: A Lot Help needed moving from lying on your back to sitting on the side of a flat bed without using bedrails?: A Lot Help needed moving to and from a bed to a chair (including a wheelchair)?: A Lot Help needed standing up from a chair using your arms (e.g., wheelchair or bedside chair)?: A Lot Help needed to walk in hospital room?: A Lot Help needed climbing 3-5 steps with a railing? : A Lot 6 Click Score: 12    End of Session Equipment Utilized During Treatment: Gait belt Activity Tolerance: Patient tolerated treatment well Patient left: with bed alarm set;with call bell/phone within reach   PT Visit Diagnosis: Muscle weakness (generalized) (M62.81);Difficulty in walking, not elsewhere classified (R26.2)     Time: 4327-6147 PT Time Calculation (min) (ACUTE ONLY): 44 min  Charges:  $Gait Training: 8-22 mins $Therapeutic Exercise: 8-22 mins $Therapeutic Activity: 8-22 mins                     Kreg Shropshire, DPT 08/09/2020, 11:17 AM

## 2020-08-09 NOTE — Progress Notes (Signed)
GI note  Being treated for hepatic encephelopathy. I will sign off.  Please call me if any further GI concerns or questions.  We would like to thank you for the opportunity to participate in the care of Corey West.    Dr Jonathon Bellows MD,MRCP [facility]) Gastroenterology/Hepatology Pager: [phone]

## 2020-08-09 NOTE — Progress Notes (Signed)
Patient ID: Corey West, male   DOB: 1951/08/13, 69 y.o.   MRN: [id number]   69 y.o. male  with past medical history of liver cirrhosis, severe portal hypertensive gastropathy, hepatic encephalopathy, esophageal varices, gastric antral vascular ectasias, chronic diastolic heart failure, chronic lower extremity venous stasis, diabetes, stage 4 chronic kidney disease, hypertension, anemia, anxiety, BPH admitted on 08/01/2020 with confusion with hepatic encephalopathy. 8 admissions in past 6 months.   Patient with continued physical and functional decline, overall failure to thrive.  High risk for decompensation into the future regardless of medical interventions.  This NP visited patient at the bedside as a follow up for palliative medicine needs and emotional support.  Dr Reesa Chew request PMT to re-engage, it is her understanding that the patient is resting hospice services.  This nurse practitioner reviewed medical records, received report from team and then met at the patient's bedside for continued conversation and education regarding diagnosis, prognosis, goals of care, treatment option decisions, advanced directive decisions and anticipatory care needs.  Patient's nephew in law/Corey West and his wife Corey West/HPOA were on speaker phone for today's conversation.  Education and a detailed discussion was had today regarding advanced directives, concept specific to [place], artificial feeding, antibiotic use, rehospitalization, and the treatment of dialysis was had.  Education offered on concept specific to human mortality and adult failure to thrive.  Education offered on the limitations of medical interventions to prolong quality of life when the body fails to thrive.  Family was much appreciative of today's conversation and they verbalized their concerns that the patient does not understand condition and the fact that there are limited medical interventions available to him to prolong quality of  life.  Plan of Care: -Full code -Patient is open to all offered and available medical interventions to prolong life, including dialysis -Patient verbalized willingness to go to SNF for rehabilitation, ultimately he wishes to return home.  (Family believe that returning home is not a safe environment and there are no caregivers available to help him at home.) -Recommend outpatient community-based palliative care to follow on discharge.  Education offered on hospice philosophy and hospice criteria.  Patient is not interested in hospice services at this time.  Discussed with patient the importance of continued conversation with his family and the  medical providers regarding overall plan of care and treatment options,  ensuring decisions are within the context of the patients values and GOCs.  Questions and concerns addressed   Discussed with Dr Reesa Chew and Dr Candiss Norse  Total time spent on the unit was 45 minutes  Greater than 50% of the time was spent in counseling and coordination of care  Wadie Lessen NP  Palliative Medicine Team Team Phone # [phone] Pager [phone]

## 2020-08-09 NOTE — TOC Progression Note (Addendum)
Transition of Care [facility]) - Progression Note    Patient Details  Name: Corey West MRN: [id number] Date of Birth: 06-25-1951  Transition of Care [facility]) CM/SW [place], LCSW Phone Number: 08/09/2020, 2:02 PM  Clinical Narrative:   CSW spoke to patient at bedside. He is agreeable to discharge to [facility] tomorrow for rehab. CSW spoke to RN and MD, patient still has rectal tube. Per Gerald Stabs at [facility], they cannot accept patient with rectal tube. Can accept patient tomorrow if rectal tube is removed. Patient is requesting a private room, CSW informed Gerald Stabs of this who reported he will try for a private room but cannot guarantee it. He will know in the morning about whether they have a private room available. Updated patient who reported he would want to wait a few days for a private room if one isn't available tomorrow. Explained that per [place], we cannot hold patient to wait for a private room but could look into other SNFs or he could talk to [facility] about moving to a private room when one became available, patient  said "we will see in the morning." Patient also confirmed he would like Outpatient Palliative services through [place]. [facility] Representative Santiago Glad aware of referral.    2:15- Call from [place] [phone]). Provided update on plan for discharge [facility] tomorrow with [facility] with Outpatient Palliative Services. Informed her that patient's family and MD are concerned about long term plan and patient not being safe to return home alone after discharge. Family reported to Palliative NP that there are no family available to serve as caregivers.    Expected Discharge Plan: [place] Barriers to Discharge: Continued Medical Work up  Expected Discharge Plan and Services Expected Discharge Plan: [place]   Discharge Planning Services: CM Consult Post Acute Care Choice: [place]  arrangements for the past 2 months: Single Family Home                                       Social Determinants of Health (SDOH) Interventions    Readmission Risk Interventions Readmission Risk Prevention Plan 07/27/2020 06/09/2020 02/02/2020  Transportation Screening Complete Complete Complete  PCP or Specialist Appt within 3-5 Days - - Complete  HRI or [place] - - Complete  Social Work Consult for [place] Planning/Counseling - - Complete  Palliative Care Screening - - Not Applicable  Medication Review [employer] photographer) Complete Complete Complete  PCP or Specialist appointment within 3-5 days of discharge Complete Complete -  [place] or Home Care Consult Complete Complete -  SW Recovery Care/Counseling Consult Complete Complete -  SW Consult Not Complete Comments - - -  Palliative Care Screening Not Applicable Not Applicable -  [place] Complete Not Applicable -  Some recent data might be hidden

## 2020-08-10 DIAGNOSIS — I872 Venous insufficiency (chronic) (peripheral): Secondary | ICD-10-CM

## 2020-08-10 LAB — RENAL FUNCTION PANEL
Albumin: 2.3 g/dL — ABNORMAL LOW (ref 3.5–5.0)
Anion gap: 9 (ref 5–15)
BUN: 58 mg/dL — ABNORMAL HIGH (ref 8–23)
CO2: 27 mmol/L (ref 22–32)
Calcium: 7.9 mg/dL — ABNORMAL LOW (ref 8.9–10.3)
Chloride: 107 mmol/L (ref 98–111)
Creatinine, Ser: 2.68 mg/dL — ABNORMAL HIGH (ref 0.61–1.24)
GFR, Estimated: 25 mL/min — ABNORMAL LOW (ref >60)
Glucose, Bld: 107 mg/dL — ABNORMAL HIGH (ref 70–99)
Phosphorus: 3.3 mg/dL (ref 2.5–4.6)
Potassium: 3.8 mmol/L (ref 3.5–5.1)
Sodium: 143 mmol/L (ref 135–145)

## 2020-08-10 LAB — GLUCOSE, CAPILLARY
Glucose-Capillary: 91 mg/dL (ref 70–99)
Glucose-Capillary: 102 mg/dL — ABNORMAL HIGH (ref 70–99)

## 2020-08-10 MED ORDER — PANTOPRAZOLE SODIUM 40 MG PO TBEC: 40.0000 mg | DELAYED_RELEASE_TABLET | Freq: Two times a day (BID) | ORAL | 11 refills | Status: DC

## 2020-08-10 MED ORDER — TORSEMIDE 20 MG PO TABS
40.0000 mg | ORAL_TABLET | Freq: Every evening | ORAL | Status: DC
Start: 2020-08-10 — End: 2020-09-12

## 2020-08-10 MED ORDER — TORSEMIDE 100 MG PO TABS
100.0000 mg | ORAL_TABLET | Freq: Every morning | ORAL | 1 refills | Status: DC
Start: 2020-08-10 — End: 2020-09-12

## 2020-08-10 NOTE — TOC Transition Note (Addendum)
Transition of Care [facility]) - CM/SW Discharge Note   Patient Details  Name: Corey West MRN: [id number] Date of Birth: 1951-01-07  Transition of Care [facility]) CM/SW Contact:  Magnus Ivan, LCSW Phone Number: 08/10/2020, 11:07 AM   Clinical Narrative:   Per RN, rectal tube removed yesterday. Per Dr. Horris Latino, patient medically ready for discharge. CSW confirmed with Gerald Stabs at [facility] that they can accept patient today, Room 610A. Per Gerald Stabs, they do not have private rooms at this time.  Asked RN to call report and MD to complete DC Summary when able.  CSW updated patient. Patient reported he would rather have a private room, but is agreeable to discharge to semi private room at [facility] today since he is medically ready for discharge.   Medical Necessity Form and Face Sheet placed in Discharge Packet next to patient chart. CSW will call EMS for transport when notified by RN that patient is ready.   1:10- [facility] transport arranged for a 1:45 pick up time. RN made aware.   Final next level of care: Skilled Nursing Facility Barriers to Discharge: Barriers Resolved   Patient Goals and CMS Choice Patient states their goals for this hospitalization and ongoing recovery are:: SNF rehab at [facility] CMS Medicare.gov Compare Post Acute Care list provided to:: Patient Choice offered to / list presented to : Patient  Discharge Placement              Patient chooses bed at: [facility] [place] Patient to be transferred to facility by: EMS Name of family member notified: Patient notified. Patient and family notified of of transfer: 08/10/20  Discharge Plan and Services   Discharge Planning Services: CM Consult Post Acute Care Choice: Home Health                               Social Determinants of Health (SDOH) Interventions     Readmission Risk Interventions Readmission Risk Prevention Plan 07/27/2020 06/09/2020 02/02/2020  Transportation Screening  Complete Complete Complete  PCP or Specialist Appt within 3-5 Days - - Complete  HRI or [place] - - Complete  Social Work Consult for [place] Planning/Counseling - - Complete  Palliative Care Screening - - Not Applicable  Medication Review Press photographer) Complete Complete Complete  PCP or Specialist appointment within 3-5 days of discharge Complete Complete -  [place] or Home Care Consult Complete Complete -  SW Recovery Care/Counseling Consult Complete Complete -  SW Consult Not Complete Comments - - -  Palliative Care Screening Not Applicable Not Applicable -  [place] Complete Not Applicable -  Some recent data might be hidden

## 2020-08-10 NOTE — Discharge Summary (Signed)
Discharge Summary  Corey West YCX:[id number] DOB: 1951/01/21  PCP: Leonel Ramsay, MD  Admit date: 08/01/2020 Discharge date: 08/10/2020  Time spent: 40 mins   Recommendations for Outpatient Follow-up:  1. PCP 2. GI as scheduled 3. Nephrology as scheduled   Discharge Diagnoses:  Active Hospital Problems   Diagnosis Date Noted  . Hepatic encephalopathy ([place]) 10/18/2017  . Anemia of chronic disease 06/07/2020  . Anxiety   . Chronic kidney disease, stage 4 (severe) ([place])   . Type 2 diabetes mellitus with stage 3 chronic kidney disease ([place]) 11/27/2019  . Gastroesophageal reflux disease 02/08/2015  . Cirrhosis ([place]) 02/08/2015  . Chronic venous stasis dermatitis of both lower extremities 10/06/2014  . Barrett's esophagus 05/25/2014  . Obesity, Class III, BMI 40-49.9 (morbid obesity) ([place]) 05/25/2014    Resolved Hospital Problems  No resolved problems to display.    Discharge Condition: Stable  Diet recommendation: Heart healthy  Vitals:   08/10/20 0852 08/10/20 1016  BP: (!) 173/58 (!) 159/52  Pulse: (!) 53 (!) 55  Resp: 16   Temp: 99 F (37.2 C)   SpO2: 97% 97%    History of present illness:  Corey A Jamesis a 69 y.o.malewith medical history significantforlivercirrhosis complicated bysevereportal hypertensive gastropathy, hepatic encephalopathyandesophageal varices, chronic diastolic heart failure, chronic lower extremityvenous stasis , type 2 diabetes mellitus with complications of stage III chronic kidney disease, hypertension, gastric antral vascular ectasias, chronic multifactorial anemia with baseline hemoglobin of 7-8, generalized anxiety disorder, BPH,[facility] on chronic anemia.He had an upper endoscopy which showed severe portal hypertensive gastropathy as well as esophageal mucosal changes secondary to established Barrett's disease in the  lower third of the esophagus. He was brought in by EMS for evaluation for confusion likely due to med non-compliance. Patient is noted to have elevated ammonia level at 109. Pt admitted for further management. Multiple hospitalizations for similar situations, generalized weakness, concern of GI bleed, worsening renal function and anasarca. Palliative care was consulted, outpt follow up. Pt stable to d/c to SNF.    Today, pt denies any new/worsening complaints, not very eager to go to SNF. Discussed the need for SNF, pt agreeable. Pt denies any abdominal pain, chest pain, SOB, N/V, fever/chills  Hospital Course:  Principal Problem:   Hepatic encephalopathy (HCC) Active Problems:   Cirrhosis ([place])   Gastroesophageal reflux disease   Chronic venous stasis dermatitis of both lower extremities   Obesity, Class III, BMI 40-49.9 (morbid obesity) ([place])   Barrett's esophagus   Type 2 diabetes mellitus with stage 3 chronic kidney disease (HCC)   Anxiety   Chronic kidney disease, stage 4 (severe) (HCC)   Anemia of chronic disease   Decompensated liver cirrhosis with hepatic encephalopathy Resolved Likely 2/2 medication noncompliance Noted complications of severe portal gastropathy/esophageal varices and pancytopenia Noted lower extremity edema Continue lactulose, titrate to 2-3 bowel movements daily if having more, may cut back lactulose, continue rifaximin, PPI Outpatient GI follow-up as scheduled  Chronic HFpEF Echocardiogram with low normal EF done in July 2021 with grade 2 diastolic dysfunction Lower extremity edema with signs of venous stasis and some weeping Continue with 100 mg of torsemide in the morning and 40 mg in the evening Continue with carvedilol  Wound care was also consulted and patient was rewrapped with Unna boot, change Unna boots weekly  CKD stage IV Renal function stable Outpatient nephrology follow-up  Anemia of chronic disease Patient has mild pancytopenia  most likely secondary to his liver disease. Baseline hemoglobin between 7-8  Anxiety disorder Continue with as needed lorazepam  Morbid obesity. Body mass index is 44.28 kg/m Lifestyle modification advised  Diabetes mellitus type II  Continue home insulin regimen       Malnutrition Type:      Malnutrition Characteristics:      Nutrition Interventions:      Estimated body mass index is 44.28 kg/m as calculated from the following:   Height as of this encounter: 5\' 9"  (1.753 m).   Weight as of this encounter: 136 kg.    Procedures:  None  Consultations:  Nephrology  GI  Discharge Exam: BP (!) 159/52   Pulse (!) 55   Temp 99 F (37.2 C) (Oral)   Resp 16   Ht 5\' 9"  (1.753 m)   Wt 136 kg   SpO2 97%   BMI 44.28 kg/m   General: NAD Cardiovascular: S1, S2 present Respiratory: CTA B Abdomen: Soft, obese, nontender, bowel sounds present     Discharge Instructions You were cared for by a hospitalist during your hospital stay. If you have any questions about your discharge medications or the care you received while you were in the hospital after you are discharged, you can call the unit and asked to speak with the hospitalist on call if the hospitalist that took care of you is not available. Once you are discharged, your primary care physician will handle any further medical issues. Please note that NO REFILLS for any discharge medications will be authorized once you are discharged, as it is imperative that you return to your primary care physician (or establish a relationship with a primary care physician if you do not have one) for your aftercare needs so that they can reassess your need for medications and monitor your lab values.  Discharge Instructions    Diet - low sodium heart healthy   Complete by: As directed    Discharge wound care:   Complete by: As directed    Bilat Una boot compression wraps in place; will need once weekly Unna boot  changes.   Increase activity slowly   Complete by: As directed      Allergies as of 08/10/2020   No Known Allergies     Medication List    STOP taking these medications   metolazone 5 MG tablet Commonly known as: ZAROXOLYN     TAKE these medications   atorvastatin 10 MG tablet Commonly known as: LIPITOR Take 20 mg by mouth every evening.   carvedilol 3.125 MG tablet Commonly known as: COREG Take 1 tablet (3.125 mg total) by mouth 2 (two) times daily with a meal.   ferrous sulfate 325 (65 FE) MG tablet Take 1 tablet (325 mg total) by mouth daily. This medication can cause black stools   lactulose 10 GM/15ML solution Commonly known as: CHRONULAC Take 90 mLs (60 g total) by mouth daily. What changed:   how much to take  when to take this   LORazepam 0.5 MG tablet Commonly known as: ATIVAN Take 1 tablet (0.5 mg total) by mouth 3 (three) times daily as needed for Anxiety   NovoLOG Mix 70/30 FlexPen (70-30) 100 UNIT/ML FlexPen Generic drug: insulin aspart protamine - aspart 0-84 Units as directed. Inject 0 - 84 Units (sliding scale) Subcutaneously twice a day before meals, at breakfast and at dinner.   pantoprazole 40 MG tablet Commonly known as: Protonix Take 1 tablet (40 mg total) by mouth 2 (  two) times daily.   potassium chloride SA 20 MEQ tablet Commonly known as: KLOR-CON Take 20 mEq by mouth 2 (two) times daily.   rifaximin 550 MG Tabs tablet Commonly known as: XIFAXAN Take 1 tablet (550 mg total) by mouth 2 (two) times daily.   sodium chloride 0.65 % Soln nasal spray Commonly known as: OCEAN Place 1 spray into both nostrils as needed for congestion (nose irritation).   tamsulosin 0.4 MG Caps capsule Commonly known as: FLOMAX Take 1 capsule (0.4 mg total) by mouth daily after supper.   torsemide 100 MG tablet Commonly known as: DEMADEX Take 1 tablet (100 mg total) by mouth in the morning. What changed: when to take this   torsemide 20 MG  tablet Commonly known as: DEMADEX Take 2 tablets (40 mg total) by mouth every evening. What changed: You were already taking a medication with the same name, and this prescription was added. Make sure you understand how and when to take each.            Discharge Care Instructions  (From admission, onward)         Start     Ordered   08/10/20 0000  Discharge wound care:       Comments: Bilat Una boot compression wraps in place; will need once weekly Unna boot changes.   08/10/20 1122         No Known Allergies  Contact information for after-discharge care    Destination    [facility] Preferred SNF .   Service: Skilled Nursing Contact information: [address] [place] [phone]                   The results of significant diagnostics from this hospitalization (including imaging, microbiology, ancillary and laboratory) are listed below for reference.    Significant Diagnostic Studies: DG Chest 2 View  Result Date: 07/25/2020 CLINICAL DATA:  Shortness of breath EXAM: CHEST - 2 VIEW COMPARISON:  07/07/2020 FINDINGS: Low lung volumes. Pulmonary vascular congestion. No pleural effusion or pneumothorax. Cardiomegaly. No acute osseous abnormality. IMPRESSION: Cardiomegaly and pulmonary vascular congestion similar to prior study. Electronically Signed   By: Macy Mis M.D.   On: 07/25/2020 14:47    Microbiology: Recent Results (from the past 240 hour(s))  Respiratory Panel by RT PCR (Flu A&B, Covid) - Nasopharyngeal Swab     Status: None   Collection Time: 08/01/20  9:23 AM   Specimen: Nasopharyngeal Swab  Result Value Ref Range Status   SARS Coronavirus 2 by RT PCR NEGATIVE NEGATIVE Final    Comment: (NOTE) SARS-CoV-2 target nucleic acids are NOT DETECTED.  The SARS-CoV-2 RNA is generally detectable in upper respiratoy specimens during the acute phase of infection. The lowest concentration of SARS-CoV-2 viral  copies this assay can detect is 131 copies/mL. A negative result does not preclude SARS-Cov-2 infection and should not be used as the sole basis for treatment or other patient management decisions. A negative result may occur with  improper specimen collection/handling, submission of specimen other than nasopharyngeal swab, presence of viral mutation(s) within the areas targeted by this assay, and inadequate number of viral copies (<131 copies/mL). A negative result must be combined with clinical observations, patient history, and epidemiological information. The expected result is Negative.  Fact Sheet for Patients:  PinkCheek.be  Fact Sheet for Healthcare Providers:  GravelBags.it  This test is no t yet approved or cleared by the [place] and  has been authorized for detection and/or diagnosis of SARS-CoV-2 by FDA under an Emergency Use Authorization (EUA). This EUA will remain  in effect (meaning this test can be used) for the duration of the COVID-19 declaration under Section 564(b)(1) of the Act, 21 U.S.C. section 360bbb-3(b)(1), unless the authorization is terminated or revoked sooner.     Influenza A by PCR NEGATIVE NEGATIVE Final   Influenza B by PCR NEGATIVE NEGATIVE Final    Comment: (NOTE) The Xpert Xpress SARS-CoV-2/FLU/RSV assay is intended as an aid in  the diagnosis of influenza from Nasopharyngeal swab specimens and  should not be used as a sole basis for treatment. Nasal washings and  aspirates are unacceptable for Xpert Xpress SARS-CoV-2/FLU/RSV  testing.  Fact Sheet for Patients: PinkCheek.be  Fact Sheet for Healthcare Providers: GravelBags.it  This test is not yet approved or cleared by the [place] FDA and  has been authorized for detection and/or diagnosis of SARS-CoV-2 by  FDA under an Emergency Use Authorization (EUA). This  EUA will remain  in effect (meaning this test can be used) for the duration of the  Covid-19 declaration under Section 564(b)(1) of the Act, 21  U.S.C. section 360bbb-3(b)(1), unless the authorization is  terminated or revoked. Performed at [facility], [address] [place], [place] [address]   Gastrointestinal Panel by PCR , Stool     Status: None   Collection Time: 08/04/20  1:45 PM   Specimen: Stool  Result Value Ref Range Status   Campylobacter species NOT DETECTED NOT DETECTED Final   Plesimonas shigelloides NOT DETECTED NOT DETECTED Final   Salmonella species NOT DETECTED NOT DETECTED Final   Yersinia enterocolitica NOT DETECTED NOT DETECTED Final   Vibrio species NOT DETECTED NOT DETECTED Final   Vibrio cholerae NOT DETECTED NOT DETECTED Final   Enteroaggregative E coli (EAEC) NOT DETECTED NOT DETECTED Final   Enteropathogenic E coli (EPEC) NOT DETECTED NOT DETECTED Final   Enterotoxigenic E coli (ETEC) NOT DETECTED NOT DETECTED Final   Shiga like toxin producing E coli (STEC) NOT DETECTED NOT DETECTED Final   Shigella/Enteroinvasive E coli (EIEC) NOT DETECTED NOT DETECTED Final   Cryptosporidium NOT DETECTED NOT DETECTED Final   Cyclospora cayetanensis NOT DETECTED NOT DETECTED Final   Entamoeba histolytica NOT DETECTED NOT DETECTED Final   Giardia lamblia NOT DETECTED NOT DETECTED Final   Adenovirus F40/41 NOT DETECTED NOT DETECTED Final   Astrovirus NOT DETECTED NOT DETECTED Final   Norovirus GI/GII NOT DETECTED NOT DETECTED Final   Rotavirus A NOT DETECTED NOT DETECTED Final   Sapovirus (I, II, IV, and V) NOT DETECTED NOT DETECTED Final    Comment: Performed at [facility], [address] [place], [place] [address]     Labs: Basic Metabolic Panel: Recent Labs  Lab 08/05/20 0650 08/06/20 0515 08/08/20 0509 08/09/20 0519 08/10/20 0519  NA 140 141 143 144 143  K 4.1 3.9 3.6 4.0 3.8  CL 106 105 107 108 107  CO2 26 26 27 28 27   GLUCOSE  108* 117* 105* 107* 107*  BUN 66* 66* 64* 61* 58*  CREATININE 2.87* 2.81* 2.83* 2.80* 2.68*  CALCIUM 8.4* 8.4* 8.0* 7.9* 7.9*  PHOS 3.3 3.4 3.3 3.5 3.3   Liver Function Tests: Recent Labs  Lab 08/05/20 0650 08/06/20 0515 08/08/20 0509 08/09/20 0519 08/10/20 0519  ALBUMIN 2.5* 2.5* 2.4* 2.4* 2.3*   No results for input(s): LIPASE, AMYLASE in the last 168 hours. No results for input(s): AMMONIA in the  last 168 hours. CBC: Recent Labs  Lab 08/04/20 0420 08/05/20 0650 08/08/20 0509  WBC 2.1* 1.8* 1.9*  HGB 7.4* 7.6* 7.4*  HCT 23.3* 24.4* 23.4*  MCV 94.0 93.8 93.2  PLT 63* 59* 58*   Cardiac Enzymes: No results for input(s): CKTOTAL, CKMB, CKMBINDEX, TROPONINI in the last 168 hours. BNP: BNP (last 3 results) Recent Labs    06/08/20 0915 06/09/20 0600 07/07/20 0603  BNP 343.3* 397.7* 243.7*    ProBNP (last 3 results) No results for input(s): PROBNP in the last 8760 hours.  CBG: Recent Labs  Lab 08/09/20 0912 08/09/20 1152 08/09/20 1650 08/09/20 2040 08/10/20 0853  GLUCAP 98 108* 118* 123* 91       Signed:  Alma Friendly, MD [facility] 08/10/2020, 12:03 PM

## 2020-08-10 NOTE — Progress Notes (Signed)
Called report to [place] LPN at [facility], patient going to room 610A

## 2020-08-12 ENCOUNTER — Other Ambulatory Visit: Payer: Self-pay

## 2020-08-12 ENCOUNTER — Non-Acute Institutional Stay: Payer: Medicare Other | Admitting: Primary Care

## 2020-08-14 ENCOUNTER — Emergency Department: Payer: Medicare Other

## 2020-08-14 ENCOUNTER — Other Ambulatory Visit: Payer: Self-pay

## 2020-08-14 ENCOUNTER — Observation Stay: Payer: Medicare Other

## 2020-08-14 ENCOUNTER — Inpatient Hospital Stay
Admission: EM | Admit: 2020-08-14 | Discharge: 2020-08-18 | DRG: 637 | Disposition: A | Discharge status: Skilled Nursing Facility | Payer: Medicare Other | Source: Skilled Nursing Facility | Attending: Internal Medicine | Admitting: Internal Medicine

## 2020-08-14 ENCOUNTER — Inpatient Hospital Stay: Payer: Medicare Other

## 2020-08-14 DIAGNOSIS — U071 COVID-19: Secondary | ICD-10-CM

## 2020-08-14 DIAGNOSIS — J9601 Acute respiratory failure with hypoxia: Secondary | ICD-10-CM | POA: Diagnosis present

## 2020-08-14 DIAGNOSIS — K7460 Unspecified cirrhosis of liver: Secondary | ICD-10-CM | POA: Diagnosis present

## 2020-08-14 DIAGNOSIS — Z6841 Body Mass Index (BMI) 40.0 and over, adult: Secondary | ICD-10-CM

## 2020-08-14 DIAGNOSIS — K766 Portal hypertension: Secondary | ICD-10-CM | POA: Diagnosis present

## 2020-08-14 DIAGNOSIS — N184 Chronic kidney disease, stage 4 (severe): Secondary | ICD-10-CM | POA: Diagnosis present

## 2020-08-14 DIAGNOSIS — I130 Hypertensive heart and chronic kidney disease with heart failure and stage 1 through stage 4 chronic kidney disease, or unspecified chronic kidney disease: Secondary | ICD-10-CM | POA: Diagnosis present

## 2020-08-14 DIAGNOSIS — D638 Anemia in other chronic diseases classified elsewhere: Secondary | ICD-10-CM | POA: Diagnosis present

## 2020-08-14 DIAGNOSIS — I5032 Chronic diastolic (congestive) heart failure: Secondary | ICD-10-CM | POA: Diagnosis present

## 2020-08-14 DIAGNOSIS — E785 Hyperlipidemia, unspecified: Secondary | ICD-10-CM | POA: Diagnosis present

## 2020-08-14 DIAGNOSIS — R0602 Shortness of breath: Secondary | ICD-10-CM | POA: Diagnosis present

## 2020-08-14 DIAGNOSIS — I2510 Atherosclerotic heart disease of native coronary artery without angina pectoris: Secondary | ICD-10-CM | POA: Diagnosis present

## 2020-08-14 DIAGNOSIS — D61818 Other pancytopenia: Secondary | ICD-10-CM | POA: Diagnosis present

## 2020-08-14 DIAGNOSIS — K219 Gastro-esophageal reflux disease without esophagitis: Secondary | ICD-10-CM | POA: Diagnosis present

## 2020-08-14 DIAGNOSIS — R410 Disorientation, unspecified: Secondary | ICD-10-CM

## 2020-08-14 DIAGNOSIS — E11649 Type 2 diabetes mellitus with hypoglycemia without coma: Principal | ICD-10-CM | POA: Diagnosis present

## 2020-08-14 DIAGNOSIS — Z79899 Other long term (current) drug therapy: Secondary | ICD-10-CM

## 2020-08-14 DIAGNOSIS — N400 Enlarged prostate without lower urinary tract symptoms: Secondary | ICD-10-CM | POA: Diagnosis present

## 2020-08-14 DIAGNOSIS — I872 Venous insufficiency (chronic) (peripheral): Secondary | ICD-10-CM | POA: Diagnosis present

## 2020-08-14 DIAGNOSIS — Z87891 Personal history of nicotine dependence: Secondary | ICD-10-CM | POA: Diagnosis not present

## 2020-08-14 DIAGNOSIS — K7290 Hepatic failure, unspecified without coma: Secondary | ICD-10-CM | POA: Diagnosis present

## 2020-08-14 DIAGNOSIS — Z794 Long term (current) use of insulin: Secondary | ICD-10-CM | POA: Diagnosis not present

## 2020-08-14 DIAGNOSIS — G9341 Metabolic encephalopathy: Secondary | ICD-10-CM | POA: Diagnosis present

## 2020-08-14 DIAGNOSIS — F411 Generalized anxiety disorder: Secondary | ICD-10-CM | POA: Diagnosis present

## 2020-08-14 DIAGNOSIS — J1282 Pneumonia due to coronavirus disease 2019: Secondary | ICD-10-CM | POA: Diagnosis present

## 2020-08-14 DIAGNOSIS — N183 Chronic kidney disease, stage 3 unspecified: Secondary | ICD-10-CM | POA: Diagnosis present

## 2020-08-14 DIAGNOSIS — J440 Chronic obstructive pulmonary disease with acute lower respiratory infection: Secondary | ICD-10-CM | POA: Diagnosis present

## 2020-08-14 DIAGNOSIS — T383X5A Adverse effect of insulin and oral hypoglycemic [antidiabetic] drugs, initial encounter: Secondary | ICD-10-CM | POA: Diagnosis present

## 2020-08-14 DIAGNOSIS — E1122 Type 2 diabetes mellitus with diabetic chronic kidney disease: Secondary | ICD-10-CM | POA: Diagnosis present

## 2020-08-14 DIAGNOSIS — E66813 Obesity, class 3: Secondary | ICD-10-CM | POA: Diagnosis present

## 2020-08-14 DIAGNOSIS — E162 Hypoglycemia, unspecified: Secondary | ICD-10-CM | POA: Diagnosis present

## 2020-08-14 DIAGNOSIS — K3189 Other diseases of stomach and duodenum: Secondary | ICD-10-CM | POA: Diagnosis present

## 2020-08-14 LAB — CBC WITH DIFFERENTIAL/PLATELET
Abs Immature Granulocytes: 0.03 10*3/uL (ref 0.00–0.07)
Basophils Absolute: 0 10*3/uL (ref 0.0–0.1)
Basophils Relative: 0 %
Eosinophils Absolute: 0 10*3/uL (ref 0.0–0.5)
Eosinophils Relative: 0 %
HCT: 27.1 % — ABNORMAL LOW (ref 39.0–52.0)
Hemoglobin: 8.7 g/dL — ABNORMAL LOW (ref 13.0–17.0)
Immature Granulocytes: 1 %
Lymphocytes Relative: 11 %
Lymphs Abs: 0.3 10*3/uL — ABNORMAL LOW (ref 0.7–4.0)
MCH: 29.1 pg (ref 26.0–34.0)
MCHC: 32.1 g/dL (ref 30.0–36.0)
MCV: 90.6 fL (ref 80.0–100.0)
Monocytes Absolute: 0.3 10*3/uL (ref 0.1–1.0)
Monocytes Relative: 8 %
Neutro Abs: 2.6 10*3/uL (ref 1.7–7.7)
Neutrophils Relative %: 80 %
Platelets: 101 10*3/uL — ABNORMAL LOW (ref 150–400)
RBC: 2.99 MIL/uL — ABNORMAL LOW (ref 4.22–5.81)
RDW: 15.4 % (ref 11.5–15.5)
WBC: 3.3 10*3/uL — ABNORMAL LOW (ref 4.0–10.5)
nRBC: 0 % (ref 0.0–0.2)

## 2020-08-14 LAB — AMMONIA: Ammonia: 28 umol/L (ref 9–35)

## 2020-08-14 LAB — RESPIRATORY PANEL BY RT PCR (FLU A&B, COVID)
Influenza A by PCR: NEGATIVE
Influenza B by PCR: NEGATIVE
SARS Coronavirus 2 by RT PCR: POSITIVE — AB

## 2020-08-14 LAB — COMPREHENSIVE METABOLIC PANEL
ALT: 19 U/L (ref 0–44)
AST: 45 U/L — ABNORMAL HIGH (ref 15–41)
Albumin: 2.3 g/dL — ABNORMAL LOW (ref 3.5–5.0)
Alkaline Phosphatase: 83 U/L (ref 38–126)
Anion gap: 8 (ref 5–15)
BUN: 50 mg/dL — ABNORMAL HIGH (ref 8–23)
CO2: 27 mmol/L (ref 22–32)
Calcium: 7.7 mg/dL — ABNORMAL LOW (ref 8.9–10.3)
Chloride: 101 mmol/L (ref 98–111)
Creatinine, Ser: 2.77 mg/dL — ABNORMAL HIGH (ref 0.61–1.24)
GFR, Estimated: 24 mL/min — ABNORMAL LOW (ref >60)
Glucose, Bld: 79 mg/dL (ref 70–99)
Potassium: 3.5 mmol/L (ref 3.5–5.1)
Sodium: 136 mmol/L (ref 135–145)
Total Bilirubin: 1 mg/dL (ref 0.3–1.2)
Total Protein: 6.4 g/dL — ABNORMAL LOW (ref 6.5–8.1)

## 2020-08-14 LAB — CBG MONITORING, ED
Glucose-Capillary: 86 mg/dL (ref 70–99)
Glucose-Capillary: 74 mg/dL (ref 70–99)
Glucose-Capillary: 80 mg/dL (ref 70–99)
Glucose-Capillary: 189 mg/dL — ABNORMAL HIGH (ref 70–99)

## 2020-08-14 LAB — GLUCOSE, CAPILLARY: Glucose-Capillary: 180 mg/dL — ABNORMAL HIGH (ref 70–99)

## 2020-08-14 LAB — PROCALCITONIN: Procalcitonin: 0.14 ng/mL

## 2020-08-14 MED ORDER — PANTOPRAZOLE SODIUM 40 MG PO TBEC
40.0000 mg | DELAYED_RELEASE_TABLET | Freq: Two times a day (BID) | ORAL | Status: DC
  Administered 2020-08-14 – 2020-08-18 (×9): 40 mg via ORAL
  Filled 2020-08-14 (×9): qty 1

## 2020-08-14 MED ORDER — SODIUM CHLORIDE 0.9 % IV SOLN
200.0000 mg | Freq: Once | INTRAVENOUS | Status: AC
  Administered 2020-08-14: 200 mg via INTRAVENOUS
  Filled 2020-08-14: qty 40

## 2020-08-14 MED ORDER — LORAZEPAM 2 MG/ML IJ SOLN
1.0000 mg | Freq: Once | INTRAMUSCULAR | Status: AC
  Administered 2020-08-14: 23:00:00 1 mg via INTRAVENOUS
  Filled 2020-08-14: qty 1

## 2020-08-14 MED ORDER — DEXAMETHASONE SODIUM PHOSPHATE 10 MG/ML IJ SOLN
6.0000 mg | INTRAMUSCULAR | Status: DC
  Administered 2020-08-14 – 2020-08-15 (×2): 6 mg via INTRAVENOUS
  Filled 2020-08-14 (×2): qty 1

## 2020-08-14 MED ORDER — ASCORBIC ACID 500 MG PO TABS
500.0000 mg | ORAL_TABLET | Freq: Every day | ORAL | Status: DC
  Administered 2020-08-14 – 2020-08-18 (×5): 500 mg via ORAL
  Filled 2020-08-14 (×5): qty 1

## 2020-08-14 MED ORDER — ATORVASTATIN CALCIUM 20 MG PO TABS
20.0000 mg | ORAL_TABLET | Freq: Every day | ORAL | Status: DC
  Administered 2020-08-14 – 2020-08-17 (×4): 20 mg via ORAL
  Filled 2020-08-14 (×4): qty 1

## 2020-08-14 MED ORDER — SODIUM CHLORIDE 0.9% FLUSH: 3.0000 mL | INTRAVENOUS | Status: DC | PRN

## 2020-08-14 MED ORDER — HYDROXYZINE HCL 25 MG PO TABS
25.0000 mg | ORAL_TABLET | Freq: Four times a day (QID) | ORAL | Status: DC | PRN
  Administered 2020-08-17: 22:00:00 25 mg via ORAL
  Filled 2020-08-14 (×3): qty 1

## 2020-08-14 MED ORDER — RIFAXIMIN 550 MG PO TABS
550.0000 mg | ORAL_TABLET | Freq: Two times a day (BID) | ORAL | Status: DC
  Administered 2020-08-14 – 2020-08-18 (×9): 550 mg via ORAL
  Filled 2020-08-14 (×10): qty 1

## 2020-08-14 MED ORDER — SODIUM CHLORIDE 0.9% FLUSH
3.0000 mL | Freq: Two times a day (BID) | INTRAVENOUS | Status: DC
  Administered 2020-08-14 – 2020-08-18 (×8): 3 mL via INTRAVENOUS

## 2020-08-14 MED ORDER — ACETAMINOPHEN 650 MG RE SUPP: 650.0000 mg | Freq: Four times a day (QID) | RECTAL | Status: DC | PRN

## 2020-08-14 MED ORDER — ACETAMINOPHEN 325 MG PO TABS: 650.0000 mg | ORAL_TABLET | Freq: Four times a day (QID) | ORAL | Status: DC | PRN

## 2020-08-14 MED ORDER — SODIUM CHLORIDE 0.9 % IV SOLN
100.0000 mg | Freq: Every day | INTRAVENOUS | Status: DC
  Administered 2020-08-15 – 2020-08-17 (×3): 100 mg via INTRAVENOUS
  Filled 2020-08-14 (×4): qty 20

## 2020-08-14 MED ORDER — ZINC SULFATE 220 (50 ZN) MG PO CAPS
220.0000 mg | ORAL_CAPSULE | Freq: Every day | ORAL | Status: DC
  Administered 2020-08-14 – 2020-08-18 (×5): 220 mg via ORAL
  Filled 2020-08-14 (×5): qty 1

## 2020-08-14 MED ORDER — HYDROXYZINE HCL 10 MG/5ML PO SYRP: 25.0000 mg | ORAL_SOLUTION | Freq: Four times a day (QID) | ORAL | Status: DC | PRN

## 2020-08-14 MED ORDER — DEXTROSE 10 % IV SOLN
INTRAVENOUS | Status: DC
  Filled 2020-08-14: qty 1000

## 2020-08-14 MED ORDER — ALBUTEROL SULFATE HFA 108 (90 BASE) MCG/ACT IN AERS
2.0000 | INHALATION_SPRAY | Freq: Four times a day (QID) | RESPIRATORY_TRACT | Status: DC
  Administered 2020-08-14 – 2020-08-18 (×16): 2 via RESPIRATORY_TRACT
  Filled 2020-08-14: qty 6.7

## 2020-08-14 MED ORDER — CARVEDILOL 3.125 MG PO TABS
3.1250 mg | ORAL_TABLET | Freq: Two times a day (BID) | ORAL | Status: DC
  Administered 2020-08-14 – 2020-08-18 (×3): 3.125 mg via ORAL
  Filled 2020-08-14 (×8): qty 1

## 2020-08-14 MED ORDER — TORSEMIDE 20 MG PO TABS
40.0000 mg | ORAL_TABLET | Freq: Every evening | ORAL | Status: DC
  Administered 2020-08-14 – 2020-08-17 (×4): 40 mg via ORAL
  Filled 2020-08-14 (×7): qty 2

## 2020-08-14 MED ORDER — POTASSIUM CHLORIDE CRYS ER 20 MEQ PO TBCR
20.0000 meq | EXTENDED_RELEASE_TABLET | Freq: Two times a day (BID) | ORAL | Status: DC
  Administered 2020-08-14 – 2020-08-18 (×9): 20 meq via ORAL
  Filled 2020-08-14 (×9): qty 1

## 2020-08-14 MED ORDER — TORSEMIDE 100 MG PO TABS
100.0000 mg | ORAL_TABLET | Freq: Every morning | ORAL | Status: DC
  Administered 2020-08-14 – 2020-08-18 (×5): 100 mg via ORAL
  Filled 2020-08-14 (×3): qty 1
  Filled 2020-08-14 (×4): qty 5

## 2020-08-14 MED ORDER — FERROUS SULFATE 325 (65 FE) MG PO TABS
325.0000 mg | ORAL_TABLET | Freq: Every day | ORAL | Status: DC
  Administered 2020-08-14 – 2020-08-18 (×5): 325 mg via ORAL
  Filled 2020-08-14 (×5): qty 1

## 2020-08-14 MED ORDER — TAMSULOSIN HCL 0.4 MG PO CAPS
0.4000 mg | ORAL_CAPSULE | Freq: Every day | ORAL | Status: DC
  Administered 2020-08-14 – 2020-08-17 (×4): 0.4 mg via ORAL
  Filled 2020-08-14 (×4): qty 1

## 2020-08-14 MED ORDER — SALINE SPRAY 0.65 % NA SOLN
1.0000 | Freq: Two times a day (BID) | NASAL | Status: DC | PRN
  Filled 2020-08-14: qty 44

## 2020-08-14 MED ORDER — GUAIFENESIN-DM 100-10 MG/5ML PO SYRP
10.0000 mL | ORAL_SOLUTION | ORAL | Status: DC | PRN
  Administered 2020-08-15: 10 mL via ORAL
  Filled 2020-08-14: qty 10

## 2020-08-14 MED ORDER — SODIUM CHLORIDE 0.9 % IV SOLN: 250.0000 mL | INTRAVENOUS | Status: DC | PRN

## 2020-08-14 MED ORDER — LACTULOSE 10 GM/15ML PO SOLN
60.0000 g | Freq: Every day | ORAL | Status: DC
  Administered 2020-08-15 – 2020-08-17 (×3): 60 g via ORAL
  Filled 2020-08-14 (×4): qty 90

## 2020-08-14 NOTE — ED Notes (Signed)
Pt refusing to keep blood pressure cuff on arm. Pt agreeable to keep cardiac monitoring leads on at this time.

## 2020-08-14 NOTE — Plan of Care (Deleted)
  Problem: Education: Goal: Knowledge of General Education information will improve Description Including pain rating scale, medication(s)/side effects and non-pharmacologic comfort measures Outcome: Progressing   

## 2020-08-14 NOTE — H&P (Signed)
History and Physical    Corey West ZOX:[id number] DOB: [birth year] DOA: 08/14/2020  PCP: Leonel Ramsay, MD   Patient coming from: Skilled nursing facility  I have personally briefly reviewed patient's old medical records in [facility]  Chief Complaint: Confusion Most of the history was obtained from ER notes.  Patient is unable to meaningfully contribute to his history due to disorientation  HPI: Corey West is a 69 y.o. 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 Patient was brought to the ER for evaluation of mental status changes.  Patient was found unresponsive by nursing home staff (time not documented), last known well was about 7 PM the night prior to his admission.  He was noted to be hypoglycemic with blood glucose of 33 and was started on D10 infusion by EMS.  By the time patient arrived the ER is blood sugar had improved to 165.  Per EMS patient was said to have had multiple seizure episodes lasting about 3 to 4 minutes throughout the night. I am unable to do a review of systems on this patient due to his disorientation. Labs show sodium 136, potassium 3.5, chloride 101, bicarb 27, BUN 50, creatinine 2.7, calcium 7.7, alkaline phosphatase 83, AST 45, ALT 19, total protein 6.4, ammonia 28, white count 3.3, hemoglobin 8.7, hematocrit 27.1, MCV 90.6, RDW 15.4 platelet count 101 CT scan of the head done without contrast was done but results were pending at  the time of this H&P His SARS coronavirus 2 PCR is positive.  Patient received 2 doses of the Pfizer COVID-19 vaccine. Twelve-lead EKG reviewed by me shows sinus rhythm with PACs   ED Course: Patient is a 69 year old Caucasian male with a history of  liver cirrhosis, diabetes mellitus with complications of stage III chronic kidney disease who was brought into the ER for evaluation of mental status changes related to hypoglycemia.  Patient had a blood sugar of 33 in the field that improved to 165 following administration of D10 infusion.  While in the ER, his blood sugar dropped to 72 off the drip.  Patient remains disoriented but is more awake.  ER requested observation for further evaluation.   Review of Systems: As per HPI otherwise 10 point review of systems negative.    Past Medical History:  Diagnosis Date  . A-fib ([place])   . Anemia   . Anxiety    controlled;   . Arthritis   . AVM (arteriovenous malformation) of stomach, acquired with hemorrhage   . Barrett's esophagus   . CHF (congestive heart failure) ([place])   . Chronic kidney disease    renal infufficiency  . Cirrhosis ([place])   . COPD (chronic obstructive pulmonary disease) ([place])   . Depression    controlled;   Marland Kitchen Diabetes mellitus without complication ([place])    not controlled, taking insulin but sugar continues to run high;   Marland Kitchen Dysrhythmia   . Edema   . Esophageal varices ([place])   . GAVE (gastric antral vascular ectasia)   . GERD (gastroesophageal reflux disease)   . Hepatic encephalopathy ([place])   .  History of hiatal hernia   . Hyperlipidemia   . Hypertension    controlled well;   . IPMN (intraductal papillary mucinous neoplasm)   . Liver cirrhosis ([place])   . Nephropathy, diabetic ([place])   . Obesity   . Pancytopenia ([place])   . Polyp, stomach    with chronic blood loss  . Portal hypertensive gastropathy ([place])   . Sleep apnea    does not wear a cpap, Medicare would not pay for it   . Venous stasis dermatitis of both  lower extremities   . Venous stasis of both lower extremities    with cellulitis    Past Surgical History:  Procedure Laterality Date  . ESOPHAGOGASTRODUODENOSCOPY N/A 02/09/2015   Procedure: ESOPHAGOGASTRODUODENOSCOPY (EGD);  Surgeon: Manya Silvas, MD;  Location: [facility] ENDOSCOPY;  Service: Endoscopy;  Laterality: N/A;  . ESOPHAGOGASTRODUODENOSCOPY N/A 07/22/2015   Procedure: ESOPHAGOGASTRODUODENOSCOPY (EGD);  Surgeon: Manya Silvas, MD;  Location: [facility] ENDOSCOPY;  Service: Endoscopy;  Laterality: N/A;  . ESOPHAGOGASTRODUODENOSCOPY N/A 06/26/2017   Procedure: ESOPHAGOGASTRODUODENOSCOPY (EGD);  Surgeon: Manya Silvas, MD;  Location: [facility] ENDOSCOPY;  Service: Endoscopy;  Laterality: N/A;  . ESOPHAGOGASTRODUODENOSCOPY N/A 06/27/2017   Procedure: ESOPHAGOGASTRODUODENOSCOPY (EGD);  Surgeon: Manya Silvas, MD;  Location: [facility] ENDOSCOPY;  Service: Endoscopy;  Laterality: N/A;  . ESOPHAGOGASTRODUODENOSCOPY N/A 06/28/2017   Procedure: ESOPHAGOGASTRODUODENOSCOPY (EGD);  Surgeon: Manya Silvas, MD;  Location: [facility] ENDOSCOPY;  Service: Endoscopy;  Laterality: N/A;  . ESOPHAGOGASTRODUODENOSCOPY Bilateral 10/11/2017   Procedure: ESOPHAGOGASTRODUODENOSCOPY (EGD);  Surgeon: Manya Silvas, MD;  Location: [facility] ENDOSCOPY;  Service: Endoscopy;  Laterality: Bilateral;  . ESOPHAGOGASTRODUODENOSCOPY N/A 12/04/2017   Procedure: ESOPHAGOGASTRODUODENOSCOPY (EGD);  Surgeon: Manya Silvas, MD;  Location: [facility] ENDOSCOPY;  Service: Endoscopy;  Laterality: N/A;  . ESOPHAGOGASTRODUODENOSCOPY N/A 03/06/2019   Procedure: ESOPHAGOGASTRODUODENOSCOPY (EGD);  Surgeon: Toledo, Benay Pike, MD;  Location: [facility] ENDOSCOPY;  Service: Gastroenterology;  Laterality: N/A;  . ESOPHAGOGASTRODUODENOSCOPY (EGD) WITH PROPOFOL N/A 07/20/2015   Procedure: ESOPHAGOGASTRODUODENOSCOPY (EGD) WITH PROPOFOL;  Surgeon: Manya Silvas, MD;  Location: [facility] ENDOSCOPY;  Service: Endoscopy;  Laterality: N/A;  . ESOPHAGOGASTRODUODENOSCOPY  (EGD) WITH PROPOFOL N/A 09/16/2015   Procedure: ESOPHAGOGASTRODUODENOSCOPY (EGD) WITH PROPOFOL;  Surgeon: Manya Silvas, MD;  Location: [facility] ENDOSCOPY;  Service: Endoscopy;  Laterality: N/A;  . ESOPHAGOGASTRODUODENOSCOPY (EGD) WITH PROPOFOL N/A 03/16/2016   Procedure: ESOPHAGOGASTRODUODENOSCOPY (EGD) WITH PROPOFOL;  Surgeon: Manya Silvas, MD;  Location: [facility] ENDOSCOPY;  Service: Endoscopy;  Laterality: N/A;  . ESOPHAGOGASTRODUODENOSCOPY (EGD) WITH PROPOFOL N/A 09/14/2016   Procedure: ESOPHAGOGASTRODUODENOSCOPY (EGD) WITH PROPOFOL;  Surgeon: Manya Silvas, MD;  Location: [facility] ENDOSCOPY;  Service: Endoscopy;  Laterality: N/A;  . ESOPHAGOGASTRODUODENOSCOPY (EGD) WITH PROPOFOL N/A 11/05/2016   Procedure: ESOPHAGOGASTRODUODENOSCOPY (EGD) WITH PROPOFOL;  Surgeon: Manya Silvas, MD;  Location: [facility] ENDOSCOPY;  Service: Endoscopy;  Laterality: N/A;  . ESOPHAGOGASTRODUODENOSCOPY (EGD) WITH PROPOFOL N/A 02/06/2017   Procedure: ESOPHAGOGASTRODUODENOSCOPY (EGD) WITH PROPOFOL;  Surgeon: Manya Silvas, MD;  Location: [facility] ENDOSCOPY;  Service: Endoscopy;  Laterality: N/A;  . ESOPHAGOGASTRODUODENOSCOPY (EGD) WITH PROPOFOL N/A 04/24/2017   Procedure: ESOPHAGOGASTRODUODENOSCOPY (EGD) WITH PROPOFOL;  Surgeon: Manya Silvas, MD;  Location: [facility] ENDOSCOPY;  Service: Endoscopy;  Laterality: N/A;  . ESOPHAGOGASTRODUODENOSCOPY (EGD) WITH PROPOFOL N/A 07/31/2017   Procedure: ESOPHAGOGASTRODUODENOSCOPY (EGD) WITH PROPOFOL;  Surgeon: Manya Silvas, MD;  Location: [facility] ENDOSCOPY;  Service: Endoscopy;  Laterality: N/A;  . ESOPHAGOGASTRODUODENOSCOPY (EGD) WITH PROPOFOL N/A 08/08/2018   Procedure: ESOPHAGOGASTRODUODENOSCOPY (EGD) WITH PROPOFOL;  Surgeon: Manya Silvas, MD;  Location: [facility] ENDOSCOPY;  Service: Endoscopy;  Laterality: N/A;  . ESOPHAGOGASTRODUODENOSCOPY (EGD) WITH PROPOFOL N/A 05/21/2020   Procedure: ESOPHAGOGASTRODUODENOSCOPY (EGD) WITH PROPOFOL;  Surgeon: [name] Manifold, MD;  Location:  [facility] ENDOSCOPY;  Service: Endoscopy;  Laterality: N/A;  . ESOPHAGOGASTRODUODENOSCOPY (EGD) WITH PROPOFOL N/A 07/18/2020   Procedure: ESOPHAGOGASTRODUODENOSCOPY (EGD) WITH PROPOFOL;  Surgeon: Toledo, Benay Pike, MD;  Location: [facility] ENDOSCOPY;  Service: Gastroenterology;  Laterality: N/A;  . ESOPHAGOGASTRODUODENOSCOPY (EGD) WITH PROPOFOL N/A 07/28/2020   Procedure: ESOPHAGOGASTRODUODENOSCOPY (EGD) WITH PROPOFOL;  Surgeon: Lesly Rubenstein, MD;  Location: [facility] ENDOSCOPY;  Service: Endoscopy;  Laterality: N/A;  . GIVENS CAPSULE STUDY N/A 12/05/2017   Procedure: GIVENS CAPSULE STUDY;  Surgeon: Manya Silvas, MD;  Location: [facility] ENDOSCOPY;  Service: Endoscopy;  Laterality: N/A;  . TONSILLECTOMY    . TONSILLECTOMY AND ADENOIDECTOMY    . ULNAR NERVE TRANSPOSITION    . UVULOPALATOPHARYNGOPLASTY       reports that he has quit smoking. His smoking use included cigars and cigarettes. He quit after 20.00 years of use. He has never used smokeless tobacco. He reports that he does not drink alcohol and does not use drugs.  No Known Allergies  Family History  Problem Relation Age of Onset  . Diabetes Other   . Transient ischemic attack Father   . CAD Father      Prior to Admission medications   Medication Sig Start Date End Date Taking? Authorizing Provider  atorvastatin (LIPITOR) 20 MG tablet Take 20 mg by mouth daily at 6 PM.  03/23/20  Yes [provider]  carvedilol (COREG) 3.125 MG tablet Take 1 tablet (3.125 mg total) by mouth 2 (two) times daily with a meal. 07/30/20  Yes Florencia Reasons, MD  ferrous sulfate 325 (65 FE) MG tablet Take 1 tablet (325 mg total) by mouth daily. This medication can cause black stools 06/11/20  Yes Wyvonnia Dusky, MD  lactulose (CHRONULAC) 10 GM/15ML solution Take 90 mLs (60 g total) by mouth daily. 05/04/20  Yes Nicole Kindred A, DO  LORazepam (ATIVAN) 0.5 MG tablet Take 0.5 mg by mouth 3 (three) times daily as needed for anxiety.    Yes [provider]  NOVOLOG MIX 70/30 FLEXPEN (70-30) 100 UNIT/ML FlexPen Inject 84 Units into the skin 2 (two) times daily.  03/17/20  Yes [provider]  pantoprazole (PROTONIX) 40 MG tablet Take 1 tablet (40 mg total) by mouth 2 (two) times daily. 08/10/20 08/10/21 Yes Alma Friendly, MD  potassium chloride SA (KLOR-CON) 20 MEQ tablet Take 20 mEq by mouth 2 (two) times daily. 07/06/20  Yes [provider]  rifaximin (XIFAXAN) 550 MG TABS tablet Take 1 tablet (550 mg total) by mouth 2 (two) times daily. 02/12/20  Yes Wieting, Richard, MD  sodium chloride (OCEAN) 0.65 % SOLN nasal spray Place 1 spray into both nostrils as needed for congestion (nose irritation). Patient taking differently: Place 1 spray into both nostrils 2 (two) times daily as needed for congestion (nose irritation).  02/12/20  Yes Wieting, Richard, MD  tamsulosin (FLOMAX) 0.4 MG CAPS capsule Take 1 capsule (0.4 mg total) by mouth daily after supper. 05/03/20  Yes Nicole Kindred A, DO  torsemide (DEMADEX) 100 MG tablet Take 1 tablet (100 mg total) by mouth in the morning. 08/10/20  Yes Alma Friendly, MD  torsemide (DEMADEX) 20 MG tablet Take 2 tablets (40 mg total) by mouth every evening. 08/10/20  Yes Alma Friendly, MD    Physical Exam: Vitals:   08/14/20 9390  08/14/20 0745 08/14/20 0930 08/14/20 1030  BP:  (!) 155/65 120/73 (!) 142/51  Pulse:  65 79 70  Resp:  20 (!) 23 (!) 23  Temp: 97.6 F (36.4 C)     TempSrc: Oral     SpO2:  100% 100% 97%  Weight:      Height:         Vitals:   08/14/20 0652 08/14/20 0745 08/14/20 0930 08/14/20 1030  BP:  (!) 155/65 120/73 (!) 142/51  Pulse:  65 79 70  Resp:  20 (!) 23 (!) 23  Temp: 97.6 F (36.4 C)     TempSrc: Oral     SpO2:  100% 100% 97%  Weight:      Height:        Constitutional: NAD, alert and oriented x 2.  Person and place, chronically ill-appearing Eyes: PERRL, lids and conjunctivae pallor ENMT: Mucous membranes are moist.  Neck: normal,  supple, no masses, no thyromegaly Respiratory: clear to auscultation bilaterally, scattered wheezing, no crackles. Normal respiratory effort. No accessory muscle use.  Cardiovascular: Regular rate and rhythm, no murmurs / rubs / gallops.  Legs are wrapped. 2+ pedal pulses. No carotid bruits.  Abdomen: no tenderness, no masses palpated. No hepatosplenomegaly. Bowel sounds positive.  Central adiposity Musculoskeletal: no clubbing / cyanosis. No joint deformity upper and lower extremities.  Skin: no rashes, lesions, ulcers.  Neurologic: Generalized weakness.  Able to move extremities Psychiatric: Normal mood and affect.   Labs on Admission: I have personally reviewed following labs and imaging studies  CBC: Recent Labs  Lab 08/08/20 0509 08/14/20 0637  WBC 1.9* 3.3*  NEUTROABS  --  2.6  HGB 7.4* 8.7*  HCT 23.4* 27.1*  MCV 93.2 90.6  PLT 58* 580*   Basic Metabolic Panel: Recent Labs  Lab 08/08/20 0509 08/09/20 0519 08/10/20 0519 08/14/20 0637  NA 143 144 143 136  K 3.6 4.0 3.8 3.5  CL 107 108 107 101  CO2 27 28 27 27   GLUCOSE 105* 107* 107* 79  BUN 64* 61* 58* 50*  CREATININE 2.83* 2.80* 2.68* 2.77*  CALCIUM 8.0* 7.9* 7.9* 7.7*  PHOS 3.3 3.5 3.3  --    GFR: Estimated Creatinine Clearance: 34.9 mL/min (A) (by C-G formula based on SCr of 2.77 mg/dL (H)). Liver Function Tests: Recent Labs  Lab 08/08/20 0509 08/09/20 0519 08/10/20 0519 08/14/20 0637  AST  --   --   --  45*  ALT  --   --   --  19  ALKPHOS  --   --   --  83  BILITOT  --   --   --  1.0  PROT  --   --   --  6.4*  ALBUMIN 2.4* 2.4* 2.3* 2.3*   No results for input(s): LIPASE, AMYLASE in the last 168 hours. Recent Labs  Lab 08/14/20 0637  AMMONIA 28   Coagulation Profile: No results for input(s): INR, PROTIME in the last 168 hours. Cardiac Enzymes: No results for input(s): CKTOTAL, CKMB, CKMBINDEX, TROPONINI in the last 168 hours. BNP (last 3 results) No results for input(s): PROBNP in the last  8760 hours. HbA1C: No results for input(s): HGBA1C in the last 72 hours. CBG: Recent Labs  Lab 08/10/20 0853 08/10/20 1212 08/14/20 0622 08/14/20 0643 08/14/20 0823  GLUCAP 91 102* 86 74 80   Lipid Profile: No results for input(s): CHOL, HDL, LDLCALC, TRIG, CHOLHDL, LDLDIRECT in the last 72 hours. Thyroid Function Tests: No results for  input(s): TSH, T4TOTAL, FREET4, T3FREE, THYROIDAB in the last 72 hours. Anemia Panel: No results for input(s): VITAMINB12, FOLATE, FERRITIN, TIBC, IRON, RETICCTPCT in the last 72 hours. Urine analysis:    Component Value Date/Time   COLORURINE YELLOW (A) 07/28/2020 1707   APPEARANCEUR HAZY (A) 07/28/2020 1707   APPEARANCEUR Clear 09/10/2014 0456   LABSPEC 1.016 07/28/2020 1707   LABSPEC 1.016 09/10/2014 0456   PHURINE 5.0 07/28/2020 1707   GLUCOSEU NEGATIVE 07/28/2020 1707   GLUCOSEU Negative 09/10/2014 0456   HGBUR NEGATIVE 07/28/2020 1707   BILIRUBINUR NEGATIVE 07/28/2020 1707   BILIRUBINUR Negative 09/10/2014 [place] 07/28/2020 1707   PROTEINUR 100 (A) 07/28/2020 1707   NITRITE NEGATIVE 07/28/2020 1707   LEUKOCYTESUR NEGATIVE 07/28/2020 1707   LEUKOCYTESUR Negative 09/10/2014 0456    Radiological Exams on Admission: DG Chest 1 View  Result Date: 08/14/2020 CLINICAL DATA:  COVID positive.  Found unresponsive. EXAM: CHEST  1 VIEW COMPARISON:  07/25/2020 FINDINGS: 0835 hours. Low volume film with stable asymmetric elevation left hemidiaphragm. The cardio pericardial silhouette is enlarged. There is pulmonary vascular congestion without overt pulmonary edema. Patchy retrocardiac atelectasis or infiltrate noted left base. No substantial pleural effusion. Telemetry leads overlie the chest. IMPRESSION: Low volume film with cardiomegaly and vascular congestion. Patchy retrocardiac atelectasis or infiltrate at the left base. Electronically Signed   By: Misty Stanley M.D.   On: 08/14/2020 09:22   CT Head Wo Contrast  Result  Date: 08/14/2020 CLINICAL DATA:  Mental status changes. EXAM: CT HEAD WITHOUT CONTRAST TECHNIQUE: Contiguous axial images were obtained from the base of the skull through the vertex without intravenous contrast. COMPARISON:  07/07/2020 FINDINGS: Brain: There is no evidence for acute hemorrhage, hydrocephalus, mass lesion, or abnormal extra-axial fluid collection. No definite CT evidence for acute infarction. Diffuse loss of parenchymal volume is consistent with atrophy. Patchy low attenuation in the deep hemispheric and periventricular white matter is nonspecific, but likely reflects chronic microvascular ischemic demyelination. Vascular: No hyperdense vessel or unexpected calcification. Skull: No evidence for fracture. No worrisome lytic or sclerotic lesion. Sinuses/Orbits: The visualized paranasal sinuses and mastoid air cells are clear. Visualized portions of the globes and intraorbital fat are unremarkable. Other: None. IMPRESSION: 1. No acute intracranial abnormality. 2. Atrophy with chronic small vessel white matter ischemic disease. Electronically Signed   By: Misty Stanley M.D.   On: 08/14/2020 08:26   CT CHEST WO CONTRAST  Result Date: 08/14/2020 CLINICAL DATA:  Follow-up of COVID-19 pneumonia. EXAM: CT CHEST WITHOUT CONTRAST TECHNIQUE: Multidetector CT imaging of the chest was performed following the standard protocol without IV contrast. COMPARISON:  CT of the chest Feb 13, 2020, chest radiograph 09/13/2020 FINDINGS: Cardiovascular: Mildly enlarged heart. No pericardial effusion. Heavy calcific atherosclerotic disease of the coronary arteries. Mild atherosclerotic disease of the aorta. Mediastinum/Nodes: No enlarged mediastinal or axillary lymph nodes. Thyroid gland, trachea, and esophagus demonstrate no significant findings. Shotty sub pathologic by CT criteria mediastinal lymph nodes. Lungs/Pleura: Relatively mild diffusely present ground-glass opacities with peripheral predominance in bilateral  lungs. The findings of a consistent with COVID 19 pneumonia. More confluent dense airspace consolidation in the dependent portion of the left lower lobe may represent superimposed bacterial pneumonia or aspiration. Upper Abdomen: Small volume water density perihepatic ascites. Musculoskeletal: No chest wall mass or suspicious bone lesions identified. IMPRESSION: 1. Relatively mild diffusely present ground-glass opacities with peripheral predominance in bilateral lungs, consistent with COVID 19 pneumonia. 2. More confluent dense airspace consolidation in the dependent portion of the  left lower lobe may represent superimposed bacterial pneumonia or aspiration. 3. Mildly enlarged heart. Heavy calcific atherosclerotic disease of the coronary arteries. 4. Small volume water density perihepatic ascites. Aortic Atherosclerosis (ICD10-I70.0). Electronically Signed   By: Fidela Salisbury M.D.   On: 08/14/2020 11:42    EKG: Independently reviewed. Sinus rhythm with PACs   Assessment/Plan Principal Problem:   Acute metabolic encephalopathy Active Problems:   Gastroesophageal reflux disease   Chronic venous stasis dermatitis of both lower extremities   Obesity, Class III, BMI 40-49.9 (morbid obesity) (HCC)   Chronic diastolic CHF (congestive heart failure) (HCC)   Type 2 diabetes mellitus with stage 3 chronic kidney disease (HCC)   Anemia of chronic disease   Hypoglycemia   Pneumonia due to COVID-19 virus      Acute metabolic encephalopathy Secondary to hypoglycemia related to insulin administration Hold insulin for now Continue D10 infusion Blood sugar checks every 4 hours Encourage oral intake     History of liver cirrhosis With complications of severe portal gastropathy/esophageal varices/pancytopenia/hepatic encephalopathy Continue carvedilol and torsemide Continue rifaximin and lactulose Maintain low-sodium diet Continue on a proton pump inhibitor    Morbid obesity (BMI 40 -  65.7) Complicates overall prognosis and care    Anemia of chronic disease Hemoglobin is stable at 8.7 g/dl    Anxiety disorder Hold lorazepam for now due to mental status changes     Diabetes mellitus with complications of stage III chronic kidney disease Maintain consistent carbohydrate diet Blood sugar checks every 4 hours Hold insulin due to hypoglycemia    Chronic venous stasis dermatitis Patient's lower extremities are wrapped We will request wound care consult for dressing changes during this hospitalization    COVID-19 pneumonia with acute respiratory failure Patient's COVID-19 PCR test is positive and he is hypoxic with room air pulse oximetry of 88% that improved following oxygen supplementation at 2 L. He is vaccinated and received 2 doses of the Pfizer vaccine Chest x-ray showed low volume film with cardiomegaly and vascular congestion.Patchy retrocardiac atelectasis or infiltrate at the left base. Patient had a CT scan of the chest without contrast which showed relatively mild diffusely present ground-glass opacities with peripheral predominance in bilateral lungs, consistent with COVID 19 pneumonia.More confluent dense airspace consolidation in the dependent portion of the left lower lobe may represent superimposed bacterial pneumonia or aspiration. Mildly enlarged heart. Heavy calcific atherosclerotic disease of the coronary arteries. We will start patient on remdesivir per protocol Place patient on Decadron 6 mg IV daily Supportive care with antitussives as well as as needed bronchodilator therapy Continue oxygen supplementation to maintain pulse oximetry greater than 92% We will obtain procalcitonin level    DVT prophylaxis: SCD Code Status: Full code Family Communication: Greater than 50% of time was spent discussing patient's condition and plan of care with his niece [name] over the phone.  All questions and concerns have been addressed.   She verbalizes understanding and agrees with the plan Disposition Plan: Back to previous home environment Consults called: None    Corey Wishon MD [facility]     08/14/2020, 12:17 PM

## 2020-08-14 NOTE — ED Triage Notes (Signed)
Pt BIB EMS from [facility]. Pt was found unresponsive by staff prior to calling EMS. Blood glucose with EMS was 33, given 25g of D10 with improvement to 165. Pt alert but disoriented to time, place, and situation upon arrival. Per [facility] staff at [facility] also endorses 5-6 seizures lasting 3-4 minutes throughout the night.

## 2020-08-14 NOTE — Plan of Care (Signed)

## 2020-08-14 NOTE — ED Provider Notes (Signed)
[facility] Emergency Department Provider Note ____________________________________________   First MD Initiated Contact with Patient 08/14/20 [phone]     (approximate)  I have reviewed the triage vital signs and the nursing notes.  HISTORY  Chief Complaint Hypoglycemia   HPI Corey West is a 69 y.o. malewho presents to the ED for evaluation of hypoglycemia.  Chart review indicates history of cirrhosis with severe portal hypertension and hypertensive gastropathy, recent admission just discharged 4 days ago for hepatic encephalopathy, chronic diastolic heart failure, DM, CKD, HTN, anemia.  Patient on 70/30 insulin at home for DM.  Patient was discharged to a local SNF, [facility].  Patient reportedly last seen at 7 PM last night at his baseline.  Patient was found this morning by staff unresponsive, EMS was called who found his blood glucose to be 33.  Patient was provided IV D10 solution with improving glucose, last checked at 165 as EMS was pulling into our ambulance bay.  Patient has no complaints and denies pain.  History is limited by his disorientation and altered mental status.   Past Medical History:  Diagnosis Date  . A-fib ([place])   . Anemia   . Anxiety    controlled;   . Arthritis   . AVM (arteriovenous malformation) of stomach, acquired with hemorrhage   . Barrett's esophagus   . CHF (congestive heart failure) ([place])   . Chronic kidney disease    renal infufficiency  . Cirrhosis ([place])   . COPD (chronic obstructive pulmonary disease) ([place])   . Depression    controlled;   Marland Kitchen Diabetes mellitus without complication ([place])    not controlled, taking insulin but sugar continues to run high;   Marland Kitchen Dysrhythmia   . Edema   . Esophageal varices ([place])   . GAVE (gastric antral vascular ectasia)   . GERD (gastroesophageal reflux disease)   . Hepatic encephalopathy ([place])   . History of hiatal hernia   . Hyperlipidemia   . Hypertension    controlled  well;   . IPMN (intraductal papillary mucinous neoplasm)   . Liver cirrhosis ([place])   . Nephropathy, diabetic ([place])   . Obesity   . Pancytopenia ([place])   . Polyp, stomach    with chronic blood loss  . Portal hypertensive gastropathy ([place])   . Sleep apnea    does not wear a cpap, Medicare would not pay for it   . Venous stasis dermatitis of both lower extremities   . Venous stasis of both lower extremities    with cellulitis    Patient Active Problem List   Diagnosis Date Noted  . GI bleed 07/26/2020  . Acute on chronic anemia 07/25/2020  . Generalized weakness 07/25/2020  . SIRS (systemic inflammatory response syndrome) ([place]) 07/07/2020  . Anemia   . Hypothermia   . Anemia of chronic disease 06/07/2020  . Prolonged QT interval 06/07/2020  . Hypoglycemia 06/07/2020  . Ascites 06/07/2020  . Portal hypertensive gastropathy ([place])   . Symptomatic anemia 05/19/2020  . Acute blood loss anemia 05/19/2020  . Acute GI bleeding 05/19/2020  . Bacteremia due to Klebsiella pneumoniae 04/26/2020  . Palliative care by specialist   . DNR (do not resuscitate) discussion   . COPD with chronic bronchitis ([place]) 04/23/2020  . Paroxysmal atrial fibrillation ([place]) 04/20/2020  . Cellulitis of left leg 02/23/2020  . Contusion of ribs, left, initial encounter 02/23/2020  . Acute hypoxemic respiratory failure ([place]) 02/04/2020  . Volume overload 01/28/2020  . Hyperlipidemia  associated with type 2 diabetes mellitus ([place]) 01/28/2020  . Liver cirrhosis ([place])   . Anxiety   . SOB (shortness of breath)   . Elevated troponin   . Chronic kidney disease, stage 4 (severe) ([place])   . Unstageable pressure ulcer of sacral region ([place]) 11/28/2019  . Chronic diastolic CHF (congestive heart failure) ([place]) 11/27/2019  . Type 2 diabetes mellitus with stage 3 chronic kidney disease ([place]) 11/27/2019  . Cellulitis of left lower extremity 11/27/2019  . Cellulitis of lower extremity 11/27/2019  . Cyst of pancreas  05/18/2019  . HCAP (healthcare-associated pneumonia) 02/03/2019  . Aphasia 01/14/2019  . Acute on chronic renal failure ([place]) 01/14/2019  . Malnutrition of moderate degree 12/03/2018  . C. difficile diarrhea 12/02/2018  . Ankle fracture, left, closed, initial encounter 10/15/2018  . Ankle fracture, right, closed, initial encounter 10/15/2018  . Ankle fracture 10/15/2018  . Pressure injury of skin 07/19/2018  . UTI (urinary tract infection) 07/18/2018  . Acute kidney injury superimposed on CKD ([place]) 01/02/2018  . 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 Goals of care, counseling/discussion   . Palliative care encounter   . Acute hepatic encephalopathy 12/16/2017  . Hepatic encephalopathy ([place]) 10/18/2017  . Hypokalemia 10/09/2017  . Hyperglycemia 10/09/2017  . Sepsis ([place]) 06/12/2017  . Type 2 diabetes mellitus with diabetic nephropathy, with long-term current use of insulin ([place]) 05/29/2017  . Long term (current) use of insulin ([place]) 05/29/2017  . Cellulitis in diabetic foot ([place]) 02/21/2017  . Cellulitis 02/21/2017  . Upper GI bleed/melena 07/20/2015  . Hematemesis 02/08/2015  . Cirrhosis ([place]) 02/08/2015  . Esophageal varices ([place]) 02/08/2015  . Gastroesophageal reflux disease 02/08/2015  . Hyperlipidemia 02/08/2015  . Chronic venous stasis dermatitis of both lower extremities 10/06/2014  . Cutaneous abscess of limb, unspecified 09/17/2014  . Edema 05/25/2014  . Hypertension 05/25/2014  . Obesity, Class III, BMI 40-49.9 (morbid obesity) ([place]) 05/25/2014  . Sleep apnea 05/25/2014  . Barrett's esophagus 05/25/2014  . Pancytopenia ([place]) 05/25/2014  . History of esophageal varices 05/25/2014  . Iron deficiency anemia 12/04/2013  . Thrombocytopenia ([place]) 12/04/2013  . Splenomegaly 11/04/2013    Past Surgical History:  Procedure Laterality Date  . ESOPHAGOGASTRODUODENOSCOPY N/A 02/09/2015   Procedure: ESOPHAGOGASTRODUODENOSCOPY (EGD);  Surgeon: Manya Silvas,  MD;  Location: [facility] ENDOSCOPY;  Service: Endoscopy;  Laterality: N/A;  . ESOPHAGOGASTRODUODENOSCOPY N/A 07/22/2015   Procedure: ESOPHAGOGASTRODUODENOSCOPY (EGD);  Surgeon: Manya Silvas, MD;  Location: [facility] ENDOSCOPY;  Service: Endoscopy;  Laterality: N/A;  . ESOPHAGOGASTRODUODENOSCOPY N/A 06/26/2017   Procedure: ESOPHAGOGASTRODUODENOSCOPY (EGD);  Surgeon: Manya Silvas, MD;  Location: [facility] ENDOSCOPY;  Service: Endoscopy;  Laterality: N/A;  . ESOPHAGOGASTRODUODENOSCOPY N/A 06/27/2017   Procedure: ESOPHAGOGASTRODUODENOSCOPY (EGD);  Surgeon: Manya Silvas, MD;  Location: [facility] ENDOSCOPY;  Service: Endoscopy;  Laterality: N/A;  . ESOPHAGOGASTRODUODENOSCOPY N/A 06/28/2017   Procedure: ESOPHAGOGASTRODUODENOSCOPY (EGD);  Surgeon: Manya Silvas, MD;  Location: [facility] ENDOSCOPY;  Service: Endoscopy;  Laterality: N/A;  . ESOPHAGOGASTRODUODENOSCOPY Bilateral 10/11/2017   Procedure: ESOPHAGOGASTRODUODENOSCOPY (EGD);  Surgeon: Manya Silvas, MD;  Location: [facility] ENDOSCOPY;  Service: Endoscopy;  Laterality: Bilateral;  . ESOPHAGOGASTRODUODENOSCOPY N/A 12/04/2017   Procedure: ESOPHAGOGASTRODUODENOSCOPY (EGD);  Surgeon: Manya Silvas, MD;  Location: [facility] ENDOSCOPY;  Service: Endoscopy;  Laterality: N/A;  . ESOPHAGOGASTRODUODENOSCOPY N/A 03/06/2019   Procedure: ESOPHAGOGASTRODUODENOSCOPY (EGD);  Surgeon: Toledo, Benay Pike, MD;  Location: [facility] ENDOSCOPY;  Service: Gastroenterology;  Laterality: N/A;  . ESOPHAGOGASTRODUODENOSCOPY (EGD) WITH PROPOFOL N/A 07/20/2015   Procedure: ESOPHAGOGASTRODUODENOSCOPY (EGD) WITH PROPOFOL;  Surgeon: Manya Silvas, MD;  Location[facility] ENDOSCOPY;  Service: Endoscopy;  Laterality: N/A;  . ESOPHAGOGASTRODUODENOSCOPY (EGD) WITH PROPOFOL N/A 09/16/2015   Procedure: ESOPHAGOGASTRODUODENOSCOPY (EGD) WITH PROPOFOL;  Surgeon: Manya Silvas, MD;  Location: [facility] ENDOSCOPY;  Service: Endoscopy;  Laterality: N/A;  . ESOPHAGOGASTRODUODENOSCOPY (EGD) WITH PROPOFOL N/A 03/16/2016    Procedure: ESOPHAGOGASTRODUODENOSCOPY (EGD) WITH PROPOFOL;  Surgeon: Manya Silvas, MD;  Location: [facility] ENDOSCOPY;  Service: Endoscopy;  Laterality: N/A;  . ESOPHAGOGASTRODUODENOSCOPY (EGD) WITH PROPOFOL N/A 09/14/2016   Procedure: ESOPHAGOGASTRODUODENOSCOPY (EGD) WITH PROPOFOL;  Surgeon: Manya Silvas, MD;  Location: [facility] ENDOSCOPY;  Service: Endoscopy;  Laterality: N/A;  . ESOPHAGOGASTRODUODENOSCOPY (EGD) WITH PROPOFOL N/A 11/05/2016   Procedure: ESOPHAGOGASTRODUODENOSCOPY (EGD) WITH PROPOFOL;  Surgeon: Manya Silvas, MD;  Location: [facility] ENDOSCOPY;  Service: Endoscopy;  Laterality: N/A;  . ESOPHAGOGASTRODUODENOSCOPY (EGD) WITH PROPOFOL N/A 02/06/2017   Procedure: ESOPHAGOGASTRODUODENOSCOPY (EGD) WITH PROPOFOL;  Surgeon: Manya Silvas, MD;  Location: [facility] ENDOSCOPY;  Service: Endoscopy;  Laterality: N/A;  . ESOPHAGOGASTRODUODENOSCOPY (EGD) WITH PROPOFOL N/A 04/24/2017   Procedure: ESOPHAGOGASTRODUODENOSCOPY (EGD) WITH PROPOFOL;  Surgeon: Manya Silvas, MD;  Location: [facility] ENDOSCOPY;  Service: Endoscopy;  Laterality: N/A;  . ESOPHAGOGASTRODUODENOSCOPY (EGD) WITH PROPOFOL N/A 07/31/2017   Procedure: ESOPHAGOGASTRODUODENOSCOPY (EGD) WITH PROPOFOL;  Surgeon: Manya Silvas, MD;  Location: [facility] ENDOSCOPY;  Service: Endoscopy;  Laterality: N/A;  . ESOPHAGOGASTRODUODENOSCOPY (EGD) WITH PROPOFOL N/A 08/08/2018   Procedure: ESOPHAGOGASTRODUODENOSCOPY (EGD) WITH PROPOFOL;  Surgeon: Manya Silvas, MD;  Location: [facility] ENDOSCOPY;  Service: Endoscopy;  Laterality: N/A;  . ESOPHAGOGASTRODUODENOSCOPY (EGD) WITH PROPOFOL N/A 05/21/2020   Procedure: ESOPHAGOGASTRODUODENOSCOPY (EGD) WITH PROPOFOL;  Surgeon: [name] Manifold, MD;  Location: [facility] ENDOSCOPY;  Service: Endoscopy;  Laterality: N/A;  . ESOPHAGOGASTRODUODENOSCOPY (EGD) WITH PROPOFOL N/A 07/18/2020   Procedure: ESOPHAGOGASTRODUODENOSCOPY (EGD) WITH PROPOFOL;  Surgeon: Toledo, Benay Pike, MD;  Location: [facility] ENDOSCOPY;  Service:  Gastroenterology;  Laterality: N/A;  . ESOPHAGOGASTRODUODENOSCOPY (EGD) WITH PROPOFOL N/A 07/28/2020   Procedure: ESOPHAGOGASTRODUODENOSCOPY (EGD) WITH PROPOFOL;  Surgeon: Lesly Rubenstein, MD;  Location: [facility] ENDOSCOPY;  Service: Endoscopy;  Laterality: N/A;  . GIVENS CAPSULE STUDY N/A 12/05/2017   Procedure: GIVENS CAPSULE STUDY;  Surgeon: Manya Silvas, MD;  Location: [facility] ENDOSCOPY;  Service: Endoscopy;  Laterality: N/A;  . TONSILLECTOMY    . TONSILLECTOMY AND ADENOIDECTOMY    . ULNAR NERVE TRANSPOSITION    . UVULOPALATOPHARYNGOPLASTY      Prior to Admission medications   Medication Sig Start Date End Date Taking? Authorizing Provider  atorvastatin (LIPITOR) 20 MG tablet Take 20 mg by mouth daily at 6 PM.  03/23/20  Yes [provider]  carvedilol (COREG) 3.125 MG tablet Take 1 tablet (3.125 mg total) by mouth 2 (two) times daily with a meal. 07/30/20  Yes Florencia Reasons, MD  ferrous sulfate 325 (65 FE) MG tablet Take 1 tablet (325 mg total) by mouth daily. This medication can cause black stools 06/11/20  Yes Wyvonnia Dusky, MD  lactulose (CHRONULAC) 10 GM/15ML solution Take 90 mLs (60 g total) by mouth daily. 05/04/20  Yes Nicole Kindred A, DO  LORazepam (ATIVAN) 0.5 MG tablet Take 0.5 mg by mouth 3 (three) times daily as needed for anxiety.    Yes [provider]  NOVOLOG MIX 70/30 FLEXPEN (70-30) 100 UNIT/ML FlexPen Inject 84 Units into the skin 2 (two) times daily.  03/17/20  Yes [provider]  pantoprazole (PROTONIX) 40 MG tablet Take 1 tablet (40 mg total) by mouth 2 (two) times daily.  08/10/20 08/10/21 Yes Alma Friendly, MD  potassium chloride SA (KLOR-CON) 20 MEQ tablet Take 20 mEq by mouth 2 (two) times daily. 07/06/20  Yes [provider]  rifaximin (XIFAXAN) 550 MG TABS tablet Take 1 tablet (550 mg total) by mouth 2 (two) times daily. 02/12/20  Yes Wieting, Richard, MD  sodium chloride (OCEAN) 0.65 % SOLN nasal spray Place 1 spray into  both nostrils as needed for congestion (nose irritation). Patient taking differently: Place 1 spray into both nostrils 2 (two) times daily as needed for congestion (nose irritation).  02/12/20  Yes Wieting, Richard, MD  tamsulosin (FLOMAX) 0.4 MG CAPS capsule Take 1 capsule (0.4 mg total) by mouth daily after supper. 05/03/20  Yes Nicole Kindred A, DO  torsemide (DEMADEX) 100 MG tablet Take 1 tablet (100 mg total) by mouth in the morning. 08/10/20  Yes Alma Friendly, MD  torsemide (DEMADEX) 20 MG tablet Take 2 tablets (40 mg total) by mouth every evening. 08/10/20  Yes Alma Friendly, MD    Allergies Patient has no known allergies.  Family History  Problem Relation Age of Onset  . Diabetes Other   . Transient ischemic attack Father   . CAD Father     Social History Social History   Tobacco Use  . Smoking status: Former Smoker    Years: 20.00    Types: Cigars, Cigarettes  . Smokeless tobacco: Never Used  Vaping Use  . Vaping Use: Never used  Substance Use Topics  . Alcohol use: No    Comment: stopped 15 years ago  . Drug use: No    Review of Systems  Unable to be accurately assessed due to patient's disorientation and altered mental status.  ____________________________________________   PHYSICAL EXAM:  VITAL SIGNS: Vitals:   08/14/20 0630 08/14/20 0652  BP: (!) 126/59   Pulse: 72   Resp: (!) 24   Temp:  97.6 F (36.4 C)  SpO2: 93%      Constitutional: Alert, sitting up in bed.  Oriented to self only, disoriented to time, location and situation.  He follows commands in all 4 extremities. Eyes: Conjunctivae are normal. PERRL. EOMI. Head: Atraumatic. Nose: No congestion/rhinnorhea. Mouth/Throat: Mucous membranes are moist.  Oropharynx non-erythematous. Neck: No stridor. No cervical spine tenderness to palpation. Cardiovascular: Normal rate, regular rhythm. Grossly normal heart sounds.  Good peripheral circulation. Respiratory: Normal respiratory  effort.  No retractions Gastrointestinal: Soft , nondistended, nontender to palpation. No CVA tenderness. Musculoskeletal:  No signs of acute trauma. Legs wrapped bilaterally, dated 11/9, with coband. Asterixis to bilateral upper extremities is present Neurologic:   No gross focal neurologic deficits are appreciated.  Follows commands in all 4 extremities without notable deficits Skin:  Skin is warm, dry Psychiatric: Difficult to be assessed  ____________________________________________   LABS (all labs ordered are listed, but only abnormal results are displayed)  Labs Reviewed  CBC WITH DIFFERENTIAL/PLATELET - Abnormal; Notable for the following components:      Result Value   WBC 3.3 (*)    RBC 2.99 (*)    Hemoglobin 8.7 (*)    HCT 27.1 (*)    Platelets 101 (*)    Lymphs Abs 0.3 (*)    All other components within normal limits  COMPREHENSIVE METABOLIC PANEL - Abnormal; Notable for the following components:   BUN 50 (*)    Creatinine, Ser 2.77 (*)    Calcium 7.7 (*)    Total Protein 6.4 (*)    Albumin 2.3 (*)  AST 45 (*)    GFR, Estimated 24 (*)    All other components within normal limits  RESPIRATORY PANEL BY RT PCR (FLU A&B, COVID)  AMMONIA  CBG MONITORING, ED  CBG MONITORING, ED   ____________________________________________  12 Lead EKG  Sinus rhythm, rate of 67 bpm.  Normal axis.  Normal intervals.  QTc 499.  No evidence of acute ischemia. ____________________________________________  RADIOLOGY  ED MD interpretation:    Official radiology report(s): No results found.  ____________________________________________   PROCEDURES and INTERVENTIONS  Procedure(s) performed (including Critical Care):  Procedures  Medications  dextrose 10 % infusion ( Intravenous New Bag/Given 08/14/20 0651)    ____________________________________________   MDM / ED COURSE   69 year old male with many chronic medical comorbidities presents to the ED from his SNF  with disorientation and recurrent hypoglycemia requiring D10 drip and medical admission.  Normal vitals.  Exam with generalize disorientation without distress, neurovascular deficits or signs of trauma.  Blood work is largely unremarkable demonstrating CKD at baseline and chronic anemia.  Patient required D10 with EMS, and then promptly dropped his blood glucose levels from 160 --> 72 over a matter of 20-30 minutes requiring initiation of D10 drip.  After this, his mentation is sharper and he looks a little bit better, but still quite disoriented and confused.  We will get a CT head and admit to hospitalist medicine for further work-up and management.  Patient signed out to oncoming the ED provider to follow-up on the CT head, and admitting hospitalist was also informed of this pending study.  Clinical Course as of Aug 14 724  Sun Aug 14, 2020  0627 Our first glucose is 67 when EMS reports it was 160 just a few minutes ago.  I urged nurse to recheck the glucose in a few minutes after she finishes getting IV and getting patient hooked up to telemetry.   [DS]  6144 Repeat glucose 72 and patient is still altered.  We will start a D10 drip   [DS]  3154 Reassessed.  Patient seems little more sharp, but continues to be disoriented.  Headed to CT right now for CT head.   [DS]    Clinical Course User Index [DS] Vladimir Crofts, MD    ____________________________________________   FINAL CLINICAL IMPRESSION(S) / ED DIAGNOSES  Final diagnoses:  Hypoglycemia  Disorientation     ED Discharge Orders    None       Armanie Ullmer Tamala Julian   Note:  This document was prepared using Dragon voice recognition software and may include unintentional dictation errors.   Vladimir Crofts, MD 08/14/20 [phone]

## 2020-08-14 NOTE — Progress Notes (Signed)
Remdesivir - Pharmacy Brief Note   O:  ALT: 19 CT chest: Relatively mild diffusely present ground-glass opacities with peripheral predominance in bilateral lungs, consistent with COVID 19 pneumonia. SpO2: 93--> 100% on 2L [place]   A/P:  Remdesivir 200 mg IVPB once followed by 100 mg IVPB daily x 4 days.   Rayna Sexton, PharmD, BCPS Clinical Pharmacist 08/14/2020 12:57 PM

## 2020-08-15 ENCOUNTER — Non-Acute Institutional Stay: Payer: Medicare Other | Admitting: Primary Care

## 2020-08-15 LAB — CBC WITH DIFFERENTIAL/PLATELET
Abs Immature Granulocytes: 0.04 10*3/uL (ref 0.00–0.07)
Basophils Absolute: 0 10*3/uL (ref 0.0–0.1)
Basophils Relative: 0 %
Eosinophils Absolute: 0 10*3/uL (ref 0.0–0.5)
Eosinophils Relative: 0 %
HCT: 23.2 % — ABNORMAL LOW (ref 39.0–52.0)
Hemoglobin: 7.5 g/dL — ABNORMAL LOW (ref 13.0–17.0)
Immature Granulocytes: 1 %
Lymphocytes Relative: 4 %
Lymphs Abs: 0.3 10*3/uL — ABNORMAL LOW (ref 0.7–4.0)
MCH: 29.4 pg (ref 26.0–34.0)
MCHC: 32.3 g/dL (ref 30.0–36.0)
MCV: 91 fL (ref 80.0–100.0)
Monocytes Absolute: 0.3 10*3/uL (ref 0.1–1.0)
Monocytes Relative: 5 %
Neutro Abs: 5.3 10*3/uL (ref 1.7–7.7)
Neutrophils Relative %: 90 %
Platelets: 82 10*3/uL — ABNORMAL LOW (ref 150–400)
RBC: 2.55 MIL/uL — ABNORMAL LOW (ref 4.22–5.81)
RDW: 15.3 % (ref 11.5–15.5)
WBC: 5.8 10*3/uL (ref 4.0–10.5)
nRBC: 0 % (ref 0.0–0.2)

## 2020-08-15 LAB — GLUCOSE, CAPILLARY
Glucose-Capillary: 206 mg/dL — ABNORMAL HIGH (ref 70–99)
Glucose-Capillary: 207 mg/dL — ABNORMAL HIGH (ref 70–99)
Glucose-Capillary: 210 mg/dL — ABNORMAL HIGH (ref 70–99)
Glucose-Capillary: 212 mg/dL — ABNORMAL HIGH (ref 70–99)
Glucose-Capillary: 263 mg/dL — ABNORMAL HIGH (ref 70–99)
Glucose-Capillary: 307 mg/dL — ABNORMAL HIGH (ref 70–99)

## 2020-08-15 LAB — COMPREHENSIVE METABOLIC PANEL
ALT: 16 U/L (ref 0–44)
AST: 34 U/L (ref 15–41)
Albumin: 2.1 g/dL — ABNORMAL LOW (ref 3.5–5.0)
Alkaline Phosphatase: 66 U/L (ref 38–126)
Anion gap: 8 (ref 5–15)
BUN: 52 mg/dL — ABNORMAL HIGH (ref 8–23)
CO2: 27 mmol/L (ref 22–32)
Calcium: 7.6 mg/dL — ABNORMAL LOW (ref 8.9–10.3)
Chloride: 105 mmol/L (ref 98–111)
Creatinine, Ser: 2.85 mg/dL — ABNORMAL HIGH (ref 0.61–1.24)
GFR, Estimated: 23 mL/min — ABNORMAL LOW (ref >60)
Glucose, Bld: 213 mg/dL — ABNORMAL HIGH (ref 70–99)
Potassium: 4.3 mmol/L (ref 3.5–5.1)
Sodium: 140 mmol/L (ref 135–145)
Total Bilirubin: 0.7 mg/dL (ref 0.3–1.2)
Total Protein: 5.8 g/dL — ABNORMAL LOW (ref 6.5–8.1)

## 2020-08-15 LAB — FERRITIN: Ferritin: 124 ng/mL (ref 24–336)

## 2020-08-15 LAB — FIBRIN DERIVATIVES D-DIMER (ARMC ONLY): Fibrin derivatives D-dimer (ARMC): 1766.81 ng/mL (FEU) — ABNORMAL HIGH (ref 0.00–499.00)

## 2020-08-15 LAB — C-REACTIVE PROTEIN: CRP: 3.4 mg/dL — ABNORMAL HIGH (ref <1.0)

## 2020-08-15 LAB — PHOSPHORUS: Phosphorus: 3.6 mg/dL (ref 2.5–4.6)

## 2020-08-15 LAB — MAGNESIUM: Magnesium: 1.6 mg/dL — ABNORMAL LOW (ref 1.7–2.4)

## 2020-08-15 MED ORDER — MAGNESIUM SULFATE 2 GM/50ML IV SOLN
2.0000 g | Freq: Once | INTRAVENOUS | Status: AC
  Administered 2020-08-15: 2 g via INTRAVENOUS
  Filled 2020-08-15: qty 50

## 2020-08-15 MED ORDER — INSULIN ASPART 100 UNIT/ML ~~LOC~~ SOLN
0.0000 [IU] | SUBCUTANEOUS | Status: DC
  Administered 2020-08-15: 11 [IU] via SUBCUTANEOUS
  Administered 2020-08-16: 3 [IU] via SUBCUTANEOUS
  Administered 2020-08-16: 5 [IU] via SUBCUTANEOUS
  Administered 2020-08-16: 8 [IU] via SUBCUTANEOUS
  Filled 2020-08-15 (×5): qty 1

## 2020-08-15 MED ORDER — INSULIN ASPART 100 UNIT/ML ~~LOC~~ SOLN
0.0000 [IU] | Freq: Three times a day (TID) | SUBCUTANEOUS | Status: DC
  Administered 2020-08-15: 12:00:00 5 [IU] via SUBCUTANEOUS
  Filled 2020-08-15: qty 1

## 2020-08-15 NOTE — Plan of Care (Signed)

## 2020-08-15 NOTE — Progress Notes (Signed)
[facility]) Hospital Liaison RN note:  This patient is a pending palliative for [facility] out patient based palliative care to follow at [place] post discharge. We will follow for disposition and update the team. Doran Clay, TOC is aware.  Zandra Abts, RN [facility] Liaison [phone]

## 2020-08-15 NOTE — Progress Notes (Signed)
PROGRESS NOTE    Corey CZAJKOWSKI  West:[id number] DOB: 03/13/1951 DOA: 08/14/2020 PCP: Leonel Ramsay, MD    Assessment & Plan:   Principal Problem:   Acute metabolic encephalopathy Active Problems:   Gastroesophageal reflux disease   Chronic venous stasis dermatitis of both lower extremities   Obesity, Class III, BMI 40-49.9 (morbid obesity) (HCC)   Chronic diastolic CHF (congestive heart failure) (HCC)   Type 2 diabetes mellitus with chronic kidney disease (HCC)   Anemia of chronic disease   Hypoglycemia   Pneumonia due to COVID-19 virus    Corey West is a 69 y.o. male with medical history significantforlivercirrhosis complicated bysevereportal hypertensive gastropathy, hepatic encephalopathyandesophageal varices, chronic diastolic heart failure, chronic lower extremityvenous stasis , type 2 diabetes mellitus with complications of stage III chronic kidney disease, hypertension, gastric antral vascular ectasias, chronic multifactorial anemia with baseline hemoglobin of 7-8, generalized anxiety disorder, BPH,whowas recently discharged [facility] on 11/10/2021to a skilled nursing facility . Patient was brought to the ER for evaluation of mental status changes.  Patient was found unresponsive by nursing home staff (time not documented), last known well was about 7 PM the night prior to his admission.  He was noted to be hypoglycemic with blood glucose of 33 and was started on D10 infusion by EMS.  By the time patient arrived the ER is blood sugar had improved to 165.    Acute metabolic encephalopathy Secondary to hypoglycemia related to insulin administration --was started on D10 infusion PLAN: --d/c D10 gtt --SSI q4h  COVID-19 pneumonia with acute hypoxic respiratory failure Patient's COVID-19 PCR test is positive and he is hypoxic with room air pulse oximetry of 88% that improved following oxygen supplementation at 2 L. He is vaccinated  and received 2 doses of the Pfizer vaccine Chest x-ray showed low volume film with cardiomegaly and vascular congestion.  Patchy retrocardiac atelectasis or infiltrate at the left base. --started on Remdesivir and IV decadron --CRP 3.4 on presentation. PLAN: --cont Remdesivir --cont Decadron 6 mg IV daily --Supportive care with antitussives as well as as needed bronchodilator therapy --Continue oxygen supplementation to maintain pulse oximetry greater than 92%  History of liver cirrhosis With complications of severe portal gastropathy/esophageal varices/pancytopenia/hepatic encephalopathy --cont torsemide --cont home rifaximin and lactulose --low-sodium diet --cont PPI BID  Morbid obesity(BMI 40 - 81.1) Complicates overall prognosis and care  Anemia of chronic disease --Hgb 7-8's --Monitor and transfuse with goal Hgb >7  Anxiety disorder Hold lorazepam for now due to mental status changes  Diabetes mellitus with complications of chronic kidney disease --d/c D10 gtt --SSI q4h for now  CKD 4 --Cr stable  Chronic venous stasis dermatitis Patient's lower extremities are wrapped We will request wound care consult for dressing changes during this hospitalization    DVT prophylaxis: SCD/Compression stockings Code Status: Full code  Family Communication:  Status is: inpatient Dispo:   The patient is from: SNF Anticipated d/c is to: SNF Anticipated d/c date is: >3 days Patient currently is not medically stable to d/c due to: AMS, COVID receiving Remdesivir   Subjective and Interval History:  Pt was sleepy and not very responsive with questions.   Objective: Vitals:   08/15/20 0802 08/15/20 1231 08/15/20 1232 08/15/20 1636  BP: (!) 120/51  (!) 130/48 (!) 125/46  Pulse: (!) 53  (!) 50 (!) 56  Resp: 20  19 19   Temp: (!) 96.5 F (35.8 C)  97.7 F (36.5 C) 97.7 F (36.5 C)  TempSrc:  Axillary  Oral   SpO2: 100% 99% 99% 100%  Weight:      Height:       No  intake or output data in the 24 hours ending 08/15/20 1721 Filed Weights   08/14/20 0627  Weight: 136 kg    Examination:   Constitutional: NAD, somnolent but arousable HEENT: conjunctivae and lids normal, EOMI CV: No cyanosis.   RESP: normal respiratory effort, on 2L Extremities: both lower legs wrapped with ACE, dependent edema under both thighs SKIN: warm, dry and intact   Data Reviewed: I have personally reviewed following labs and imaging studies  CBC: Recent Labs  Lab 08/14/20 0637 08/15/20 0611  WBC 3.3* 5.8  NEUTROABS 2.6 5.3  HGB 8.7* 7.5*  HCT 27.1* 23.2*  MCV 90.6 91.0  PLT 101* 82*   Basic Metabolic Panel: Recent Labs  Lab 08/09/20 0519 08/10/20 0519 08/14/20 0637 08/15/20 0611  NA 144 143 136 140  K 4.0 3.8 3.5 4.3  CL 108 107 101 105  CO2 28 27 27 27   GLUCOSE 107* 107* 79 213*  BUN 61* 58* 50* 52*  CREATININE 2.80* 2.68* 2.77* 2.85*  CALCIUM 7.9* 7.9* 7.7* 7.6*  MG  --   --   --  1.6*  PHOS 3.5 3.3  --  3.6   GFR: Estimated Creatinine Clearance: 34 mL/min (A) (by C-G formula based on SCr of 2.85 mg/dL (H)). Liver Function Tests: Recent Labs  Lab 08/09/20 0519 08/10/20 0519 08/14/20 0637 08/15/20 0611  AST  --   --  45* 34  ALT  --   --  19 16  ALKPHOS  --   --  83 66  BILITOT  --   --  1.0 0.7  PROT  --   --  6.4* 5.8*  ALBUMIN 2.4* 2.3* 2.3* 2.1*   No results for input(s): LIPASE, AMYLASE in the last 168 hours. Recent Labs  Lab 08/14/20 0637  AMMONIA 28   Coagulation Profile: No results for input(s): INR, PROTIME in the last 168 hours. Cardiac Enzymes: No results for input(s): CKTOTAL, CKMB, CKMBINDEX, TROPONINI in the last 168 hours. BNP (last 3 results) No results for input(s): PROBNP in the last 8760 hours. HbA1C: No results for input(s): HGBA1C in the last 72 hours. CBG: Recent Labs  Lab 08/15/20 0017 08/15/20 0405 08/15/20 0801 08/15/20 1230 08/15/20 1636  GLUCAP 210* 207* 212* 206* 263*   Lipid Profile: No  results for input(s): CHOL, HDL, LDLCALC, TRIG, CHOLHDL, LDLDIRECT in the last 72 hours. Thyroid Function Tests: No results for input(s): TSH, T4TOTAL, FREET4, T3FREE, THYROIDAB in the last 72 hours. Anemia Panel: Recent Labs    08/15/20 0611  FERRITIN 124   Sepsis Labs: Recent Labs  Lab 08/14/20 1737  PROCALCITON 0.14    Recent Results (from the past 240 hour(s))  Respiratory Panel by RT PCR (Flu A&B, Covid) - Nasopharyngeal Swab     Status: Abnormal   Collection Time: 08/14/20  6:30 AM   Specimen: Nasopharyngeal Swab  Result Value Ref Range Status   SARS Coronavirus 2 by RT PCR POSITIVE (A) NEGATIVE Final    Comment: RESULT CALLED TO, READ BACK BY AND VERIFIED WITH:  ANNIE SMITH AT 0801 08/14/20 SDR (NOTE) SARS-CoV-2 target nucleic acids are DETECTED.  SARS-CoV-2 RNA is generally detectable in upper respiratory specimens  during the acute phase of infection. Positive results are indicative of the presence of the identified virus, but do not rule out bacterial infection or co-infection with other  pathogens not detected by the test. Clinical correlation with patient history and other diagnostic information is necessary to determine patient infection status. The expected result is Negative.  Fact Sheet for Patients:  PinkCheek.be  Fact Sheet for Healthcare Providers: GravelBags.it  This test is not yet approved or cleared by the [place] FDA and  has been authorized for detection and/or diagnosis of SARS-CoV-2 by FDA under an Emergency Use Authorization (EUA).  This EUA will remain in effect (meaning this test can be u sed) for the duration of  the COVID-19 declaration under Section 564(b)(1) of the Act, 21 U.S.C. section 360bbb-3(b)(1), unless the authorization is terminated or revoked sooner.      Influenza A by PCR NEGATIVE NEGATIVE Final   Influenza B by PCR NEGATIVE NEGATIVE Final    Comment:  (NOTE) The Xpert Xpress SARS-CoV-2/FLU/RSV assay is intended as an aid in  the diagnosis of influenza from Nasopharyngeal swab specimens and  should not be used as a sole basis for treatment. Nasal washings and  aspirates are unacceptable for Xpert Xpress SARS-CoV-2/FLU/RSV  testing.  Fact Sheet for Patients: PinkCheek.be  Fact Sheet for Healthcare Providers: GravelBags.it  This test is not yet approved or cleared by the [place] FDA and  has been authorized for detection and/or diagnosis of SARS-CoV-2 by  FDA under an Emergency Use Authorization (EUA). This EUA will remain  in effect (meaning this test can be used) for the duration of the  Covid-19 declaration under Section 564(b)(1) of the Act, 21  U.S.C. section 360bbb-3(b)(1), unless the authorization is  terminated or revoked. Performed at [facility], [address] [place], [place] [address]       Radiology Studies: DG Chest 1 View  Result Date: 08/14/2020 CLINICAL DATA:  COVID positive.  Found unresponsive. EXAM: CHEST  1 VIEW COMPARISON:  07/25/2020 FINDINGS: 0835 hours. Low volume film with stable asymmetric elevation left hemidiaphragm. The cardio pericardial silhouette is enlarged. There is pulmonary vascular congestion without overt pulmonary edema. Patchy retrocardiac atelectasis or infiltrate noted left base. No substantial pleural effusion. Telemetry leads overlie the chest. IMPRESSION: Low volume film with cardiomegaly and vascular congestion. Patchy retrocardiac atelectasis or infiltrate at the left base. Electronically Signed   By: Misty Stanley M.D.   On: 08/14/2020 09:22   CT Head Wo Contrast  Result Date: 08/14/2020 CLINICAL DATA:  Mental status changes. EXAM: CT HEAD WITHOUT CONTRAST TECHNIQUE: Contiguous axial images were obtained from the base of the skull through the vertex without intravenous contrast. COMPARISON:  07/07/2020  FINDINGS: Brain: There is no evidence for acute hemorrhage, hydrocephalus, mass lesion, or abnormal extra-axial fluid collection. No definite CT evidence for acute infarction. Diffuse loss of parenchymal volume is consistent with atrophy. Patchy low attenuation in the deep hemispheric and periventricular white matter is nonspecific, but likely reflects chronic microvascular ischemic demyelination. Vascular: No hyperdense vessel or unexpected calcification. Skull: No evidence for fracture. No worrisome lytic or sclerotic lesion. Sinuses/Orbits: The visualized paranasal sinuses and mastoid air cells are clear. Visualized portions of the globes and intraorbital fat are unremarkable. Other: None. IMPRESSION: 1. No acute intracranial abnormality. 2. Atrophy with chronic small vessel white matter ischemic disease. Electronically Signed   By: Misty Stanley M.D.   On: 08/14/2020 08:26   CT CHEST WO CONTRAST  Result Date: 08/14/2020 CLINICAL DATA:  Follow-up of COVID-19 pneumonia. EXAM: CT CHEST WITHOUT CONTRAST TECHNIQUE: Multidetector CT imaging of the chest was performed following the standard protocol without IV contrast. COMPARISON:  CT of the chest Feb 13, 2020, chest radiograph 09/13/2020 FINDINGS: Cardiovascular: Mildly enlarged heart. No pericardial effusion. Heavy calcific atherosclerotic disease of the coronary arteries. Mild atherosclerotic disease of the aorta. Mediastinum/Nodes: No enlarged mediastinal or axillary lymph nodes. Thyroid gland, trachea, and esophagus demonstrate no significant findings. Shotty sub pathologic by CT criteria mediastinal lymph nodes. Lungs/Pleura: Relatively mild diffusely present ground-glass opacities with peripheral predominance in bilateral lungs. The findings of a consistent with COVID 19 pneumonia. More confluent dense airspace consolidation in the dependent portion of the left lower lobe may represent superimposed bacterial pneumonia or aspiration. Upper Abdomen: Small  volume water density perihepatic ascites. Musculoskeletal: No chest wall mass or suspicious bone lesions identified. IMPRESSION: 1. Relatively mild diffusely present ground-glass opacities with peripheral predominance in bilateral lungs, consistent with COVID 19 pneumonia. 2. More confluent dense airspace consolidation in the dependent portion of the left lower lobe may represent superimposed bacterial pneumonia or aspiration. 3. Mildly enlarged heart. Heavy calcific atherosclerotic disease of the coronary arteries. 4. Small volume water density perihepatic ascites. Aortic Atherosclerosis (ICD10-I70.0). Electronically Signed   By: Fidela Salisbury M.D.   On: 08/14/2020 11:42     Scheduled Meds: . albuterol  2 puff Inhalation Q6H  . vitamin C  500 mg Oral Daily  . atorvastatin  20 mg Oral q1800  . carvedilol  3.125 mg Oral BID WC  . dexamethasone (DECADRON) injection  6 mg Intravenous Q24H  . ferrous sulfate  325 mg Oral Daily  . insulin aspart  0-15 Units Subcutaneous TID WC  . lactulose  60 g Oral Daily  . pantoprazole  40 mg Oral BID  . potassium chloride SA  20 mEq Oral BID  . rifaximin  550 mg Oral BID  . sodium chloride flush  3 mL Intravenous Q12H  . tamsulosin  0.4 mg Oral QPC supper  . torsemide  100 mg Oral q AM  . torsemide  40 mg Oral QPM  . zinc sulfate  220 mg Oral Daily   Continuous Infusions: . sodium chloride    . remdesivir 100 mg in NS 100 mL 100 mg (08/15/20 1210)     LOS: 1 day     Enzo Bi, MD [facility] If 7PM-7AM, please contact night-coverage 08/15/2020, 5:21 PM

## 2020-08-16 LAB — GLUCOSE, CAPILLARY
Glucose-Capillary: 187 mg/dL — ABNORMAL HIGH (ref 70–99)
Glucose-Capillary: 227 mg/dL — ABNORMAL HIGH (ref 70–99)
Glucose-Capillary: 257 mg/dL — ABNORMAL HIGH (ref 70–99)
Glucose-Capillary: 272 mg/dL — ABNORMAL HIGH (ref 70–99)
Glucose-Capillary: 286 mg/dL — ABNORMAL HIGH (ref 70–99)
Glucose-Capillary: 369 mg/dL — ABNORMAL HIGH (ref 70–99)

## 2020-08-16 LAB — HEMOGLOBIN A1C
Hgb A1c MFr Bld: 6.7 % — ABNORMAL HIGH (ref 4.8–5.6)
Mean Plasma Glucose: 146 mg/dL

## 2020-08-16 LAB — FIBRIN DERIVATIVES D-DIMER (ARMC ONLY): Fibrin derivatives D-dimer (ARMC): 1726.55 ng/mL (FEU) — ABNORMAL HIGH (ref 0.00–499.00)

## 2020-08-16 LAB — BASIC METABOLIC PANEL
Anion gap: 8 (ref 5–15)
BUN: 65 mg/dL — ABNORMAL HIGH (ref 8–23)
CO2: 29 mmol/L (ref 22–32)
Calcium: 8.1 mg/dL — ABNORMAL LOW (ref 8.9–10.3)
Chloride: 104 mmol/L (ref 98–111)
Creatinine, Ser: 2.87 mg/dL — ABNORMAL HIGH (ref 0.61–1.24)
GFR, Estimated: 23 mL/min — ABNORMAL LOW (ref >60)
Glucose, Bld: 235 mg/dL — ABNORMAL HIGH (ref 70–99)
Potassium: 4.9 mmol/L (ref 3.5–5.1)
Sodium: 141 mmol/L (ref 135–145)

## 2020-08-16 LAB — MAGNESIUM: Magnesium: 2.1 mg/dL (ref 1.7–2.4)

## 2020-08-16 LAB — C-REACTIVE PROTEIN: CRP: 2.9 mg/dL — ABNORMAL HIGH (ref <1.0)

## 2020-08-16 MED ORDER — DEXAMETHASONE 4 MG PO TABS
6.0000 mg | ORAL_TABLET | Freq: Every day | ORAL | Status: DC
  Administered 2020-08-16 – 2020-08-18 (×3): 6 mg via ORAL
  Filled 2020-08-16 (×3): qty 2

## 2020-08-16 MED ORDER — INSULIN ASPART 100 UNIT/ML ~~LOC~~ SOLN
0.0000 [IU] | Freq: Three times a day (TID) | SUBCUTANEOUS | Status: DC
  Administered 2020-08-16 – 2020-08-17 (×3): 8 [IU] via SUBCUTANEOUS
  Administered 2020-08-17: 15 [IU] via SUBCUTANEOUS
  Administered 2020-08-17: 11 [IU] via SUBCUTANEOUS
  Administered 2020-08-18: 10:00:00 3 [IU] via SUBCUTANEOUS
  Filled 2020-08-16 (×6): qty 1

## 2020-08-16 NOTE — TOC Initial Note (Signed)
Transition of Care [facility]) - Initial/Assessment Note    Patient Details  Name: Corey West MRN: [id number] Date of Birth: 1950/12/23  [facility] CM/SW Contact:    Shelbie Hutching, RN Phone Number: 08/16/2020, 2:48 PM  Clinical Narrative:                 Patient admitted to the hospital with metabolic encephalopathy related to hypoglycemia, patient also found to have COVID on admission.  [facility] came from [facility] was discharged there from this hospital on 11/10.  Before previous admission to the hospital patient was living at home alone.  Patient's niece [name] reports that his functional status was not good and he was unable to care for himself.  Patient eventually needs long term care but agreeable to SNF, patient is angry that [facility] will not accept him back.  TOC team will begin bed search for another facility.   DSS case worker is (DeeDee) Delextine Willey Blade [phone].  RNCM left a message on voicemail for her to return call.   Expected Discharge Plan: Skilled Nursing Facility Barriers to Discharge: SNF Pending bed offer, Continued Medical Work up   Patient Goals and CMS Choice Patient states their goals for this hospitalization and ongoing recovery are:: Patient upset that [facility] will not accept him back- will have to find alternative placement CMS Medicare.gov Compare Post Acute Care list provided to:: Patient Choice offered to / list presented to : Patient  Expected Discharge Plan and Services Expected Discharge Plan: [place]   Discharge Planning Services: CM Consult Post Acute Care Choice: [place] Living arrangements for the past 2 months: Single Family Home                           HH Arranged: NA          Prior Living Arrangements/Services Living arrangements for the past 2 months: Single Family Home Lives with:: Self Patient language and need for interpreter reviewed:: Yes Do you feel safe going back to the  place where you live?: Yes      Need for Family Participation in Patient Care: Yes (Comment) (debilitated unable to live alone) Care giver support system in place?: Yes (comment) (niece)   Criminal Activity/Legal Involvement Pertinent to Current Situation/Hospitalization: No - Comment as needed  Activities of Daily Living Home Assistive Devices/Equipment: None ADL Screening (condition at time of admission) Patient's cognitive ability adequate to safely complete daily activities?: No Is the patient deaf or have difficulty hearing?: No Does the patient have difficulty seeing, even when wearing glasses/contacts?: No Does the patient have difficulty concentrating, remembering, or making decisions?: No Patient able to express need for assistance with ADLs?: Yes Does the patient have difficulty dressing or bathing?: Yes Independently performs ADLs?: No Does the patient have difficulty walking or climbing stairs?: Yes Weakness of Legs: Both Weakness of Arms/Hands: None  Permission Sought/Granted   Permission granted to share information with : Yes, Verbal Permission Granted  Share Information with NAME: [name]  Permission granted to share info w AGENCY: SNF's  Permission granted to share info w Relationship: niece     Emotional Assessment   Attitude/Demeanor/Rapport: Avoidant, Complaining Affect (typically observed): Frustrated Orientation: : Oriented to Self, Oriented to Place, Oriented to  Time, Oriented to Situation Alcohol / Substance Use: Not Applicable Psych Involvement: No (comment)  Admission diagnosis:  SOB (shortness of breath) [R06.02] Hypoglycemia [E16.2] Disorientation [Q65.7] Acute metabolic encephalopathy [  G93.41] Patient Active Problem List   Diagnosis Date Noted  . Acute metabolic encephalopathy 56/43/3295  . Pneumonia due to COVID-19 virus 08/14/2020  . GI bleed 07/26/2020  . Acute on chronic anemia 07/25/2020  . Generalized weakness 07/25/2020  . SIRS  (systemic inflammatory response syndrome) ([place]) 07/07/2020  . Anemia   . Hypothermia   . Anemia of chronic disease 06/07/2020  . Prolonged QT interval 06/07/2020  . Hypoglycemia 06/07/2020  . Ascites 06/07/2020  . Portal hypertensive gastropathy ([place])   . Symptomatic anemia 05/19/2020  . Acute blood loss anemia 05/19/2020  . Acute GI bleeding 05/19/2020  . Bacteremia due to Klebsiella pneumoniae 04/26/2020  . Palliative care by specialist   . DNR (do not resuscitate) discussion   . COPD with chronic bronchitis ([place]) 04/23/2020  . Paroxysmal atrial fibrillation ([place]) 04/20/2020  . Cellulitis of left leg 02/23/2020  . Contusion of ribs, left, initial encounter 02/23/2020  . Acute hypoxemic respiratory failure ([place]) 02/04/2020  . Volume overload 01/28/2020  . Hyperlipidemia associated with type 2 diabetes mellitus ([place]) 01/28/2020  . Liver cirrhosis ([place])   . Anxiety   . SOB (shortness of breath)   . Elevated troponin   . Chronic kidney disease, stage 4 (severe) ([place])   . Unstageable pressure ulcer of sacral region ([place]) 11/28/2019  . Chronic diastolic CHF (congestive heart failure) ([place]) 11/27/2019  . Type 2 diabetes mellitus with stage 3 chronic kidney disease ([place]) 11/27/2019  . Cellulitis of left lower extremity 11/27/2019  . Cellulitis of lower extremity 11/27/2019  . Cyst of pancreas 05/18/2019  . HCAP (healthcare-associated pneumonia) 02/03/2019  . Aphasia 01/14/2019  . Acute on chronic renal failure ([place]) 01/14/2019  . Malnutrition of moderate degree 12/03/2018  . C. difficile diarrhea 12/02/2018  . Ankle fracture, left, closed, initial encounter 10/15/2018  . Ankle fracture, right, closed, initial encounter 10/15/2018  . Ankle fracture 10/15/2018  . Pressure injury of skin 07/19/2018  . UTI (urinary tract infection) 07/18/2018  . Acute kidney injury superimposed on CKD ([place]) 01/02/2018  . 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 Goals of care,  counseling/discussion   . Palliative care encounter   . Acute hepatic encephalopathy 12/16/2017  . Hepatic encephalopathy ([place]) 10/18/2017  . Hypokalemia 10/09/2017  . Hyperglycemia 10/09/2017  . Sepsis ([place]) 06/12/2017  . Type 2 diabetes mellitus with diabetic nephropathy, with long-term current use of insulin ([place]) 05/29/2017  . Long term (current) use of insulin ([place]) 05/29/2017  . Cellulitis in diabetic foot ([place]) 02/21/2017  . Cellulitis 02/21/2017  . Upper GI bleed/melena 07/20/2015  . Hematemesis 02/08/2015  . Cirrhosis ([place]) 02/08/2015  . Esophageal varices ([place]) 02/08/2015  . Gastroesophageal reflux disease 02/08/2015  . Hyperlipidemia 02/08/2015  . Chronic venous stasis dermatitis of both lower extremities 10/06/2014  . Cutaneous abscess of limb, unspecified 09/17/2014  . Edema 05/25/2014  . Hypertension 05/25/2014  . Obesity, Class III, BMI 40-49.9 (morbid obesity) ([place]) 05/25/2014  . Sleep apnea 05/25/2014  . Barrett's esophagus 05/25/2014  . Pancytopenia ([place]) 05/25/2014  . History of esophageal varices 05/25/2014  . Iron deficiency anemia 12/04/2013  . Thrombocytopenia ([place]) 12/04/2013  . Splenomegaly 11/04/2013   PCP:  Leonel Ramsay, MD Pharmacy:   [place], [place] - [place] [address] Phone: [phone] Fax: [phone]     Social Determinants of Health (SDOH) Interventions    Readmission Risk Interventions Readmission Risk Prevention Plan 08/16/2020 07/27/2020 06/09/2020  Transportation Screening  Complete Complete Complete  PCP or Specialist Appt within 3-5 Days - - -  HRI or [place] for [place] - - -  Medication Review Press photographer) Complete Complete Complete  PCP or Specialist appointment within 3-5 days of discharge Complete Complete Complete  HRI or Home Care Consult Complete Complete  Complete  SW Recovery Care/Counseling Consult Complete Complete Complete  SW Consult Not Complete Comments - - -  Palliative Care Screening Complete Not Applicable Not Applicable  Skilled Nursing Facility Complete Complete Not Applicable  Some recent data might be hidden

## 2020-08-16 NOTE — NC FL2 (Signed)
[place] LEVEL OF CARE SCREENING TOOL     IDENTIFICATION  Patient Name: Corey West Birthdate: 06-03-1951 Sex: male Admission Date (Current Location): 08/14/2020  [place] and [place] Number:  Engineering geologist and Address:  [facility], [address] [place], [place] [address]      Provider Number: [id number]  Attending Physician Name and Address:  Enzo Bi, MD  Relative Name and Phone Number:  [name] (niece) [phone]    Current Level of Care: Hospital Recommended Level of Care: [place] Prior Approval Number:    Date Approved/Denied:   PASRR Number: [id number]  Discharge Plan: SNF    Current Diagnoses: Patient Active Problem List   Diagnosis Date Noted  . Acute metabolic encephalopathy 93/57/0177  . Pneumonia due to COVID-19 virus 08/14/2020  . GI bleed 07/26/2020  . Acute on chronic anemia 07/25/2020  . Generalized weakness 07/25/2020  . SIRS (systemic inflammatory response syndrome) ([place]) 07/07/2020  . Anemia   . Hypothermia   . Anemia of chronic disease 06/07/2020  . Prolonged QT interval 06/07/2020  . Hypoglycemia 06/07/2020  . Ascites 06/07/2020  . Portal hypertensive gastropathy ([place])   . Symptomatic anemia 05/19/2020  . Acute blood loss anemia 05/19/2020  . Acute GI bleeding 05/19/2020  . Bacteremia due to Klebsiella pneumoniae 04/26/2020  . Palliative care by specialist   . DNR (do not resuscitate) discussion   . COPD with chronic bronchitis ([place]) 04/23/2020  . Paroxysmal atrial fibrillation ([place]) 04/20/2020  . Cellulitis of left leg 02/23/2020  . Contusion of ribs, left, initial encounter 02/23/2020  . Acute hypoxemic respiratory failure ([place]) 02/04/2020  . Volume overload 01/28/2020  . Hyperlipidemia associated with type 2 diabetes mellitus ([place]) 01/28/2020  . Liver cirrhosis ([place])   . Anxiety   . SOB (shortness of breath)   . Elevated troponin   . Chronic kidney disease,  stage 4 (severe) ([place])   . Unstageable pressure ulcer of sacral region ([place]) 11/28/2019  . Chronic diastolic CHF (congestive heart failure) ([place]) 11/27/2019  . Type 2 diabetes mellitus with stage 3 chronic kidney disease ([place]) 11/27/2019  . Cellulitis of left lower extremity 11/27/2019  . Cellulitis of lower extremity 11/27/2019  . Cyst of pancreas 05/18/2019  . HCAP (healthcare-associated pneumonia) 02/03/2019  . Aphasia 01/14/2019  . Acute on chronic renal failure ([place]) 01/14/2019  . Malnutrition of moderate degree 12/03/2018  . C. difficile diarrhea 12/02/2018  . Ankle fracture, left, closed, initial encounter 10/15/2018  . Ankle fracture, right, closed, initial encounter 10/15/2018  . Ankle fracture 10/15/2018  . Pressure injury of skin 07/19/2018  . UTI (urinary tract infection) 07/18/2018  . Acute kidney injury superimposed on CKD ([place]) 01/02/2018  . 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 Goals of care, counseling/discussion   . Palliative care encounter   . Acute hepatic encephalopathy 12/16/2017  . Hepatic encephalopathy ([place]) 10/18/2017  . Hypokalemia 10/09/2017  . Hyperglycemia 10/09/2017  . Sepsis ([place]) 06/12/2017  . Type 2 diabetes mellitus with diabetic nephropathy, with long-term current use of insulin ([place]) 05/29/2017  . Long term (current) use of insulin ([place]) 05/29/2017  . Cellulitis in diabetic foot ([place]) 02/21/2017  . Cellulitis 02/21/2017  . Upper GI bleed/melena 07/20/2015  . Hematemesis 02/08/2015  . Cirrhosis ([place]) 02/08/2015  . Esophageal varices ([place]) 02/08/2015  . Gastroesophageal reflux disease 02/08/2015  . Hyperlipidemia 02/08/2015  . Chronic venous stasis dermatitis of both lower extremities 10/06/2014  . Cutaneous abscess of limb,  unspecified 09/17/2014  . Edema 05/25/2014  . Hypertension 05/25/2014  . Obesity, Class III, BMI 40-49.9 (morbid obesity) ([place]) 05/25/2014  . Sleep apnea 05/25/2014  . Barrett's esophagus 05/25/2014  .  Pancytopenia ([place]) 05/25/2014  . History of esophageal varices 05/25/2014  . Iron deficiency anemia 12/04/2013  . Thrombocytopenia ([place]) 12/04/2013  . Splenomegaly 11/04/2013    Orientation RESPIRATION BLADDER Height & Weight     Self, Time, Situation, Place  O2 ([place] 2L) Incontinent Weight: 136 kg Height:  5\' 9"  (175.3 cm)  BEHAVIORAL SYMPTOMS/MOOD NEUROLOGICAL BOWEL NUTRITION STATUS      Incontinent Diet (Heart healthy/ Carb modified)  AMBULATORY STATUS COMMUNICATION OF NEEDS Skin   Extensive Assist Verbally Other (Comment) (dermetitis, non pressure wound Left leg)                       Personal Care Assistance Level of Assistance  Bathing, Feeding, Dressing Bathing Assistance: Maximum assistance Feeding assistance: Limited assistance Dressing Assistance: Maximum assistance     Functional Limitations Info             SPECIAL CARE FACTORS FREQUENCY  PT (By licensed PT), OT (By licensed OT)     PT Frequency: 5 times per week OT Frequency: 5 times per week            Contractures Contractures Info: Not present    Additional Factors Info  Code Status, Allergies Code Status Info: Full Allergies Info: NKA           Current Medications (08/16/2020):  This is the current hospital active medication list Current Facility-Administered Medications  Medication Dose Route Frequency Provider Last Rate Last Admin  . 0.9 %  sodium chloride infusion  250 mL Intravenous PRN Agbata, Tochukwu, MD      . acetaminophen (TYLENOL) tablet 650 mg  650 mg Oral Q6H PRN Agbata, Tochukwu, MD       Or  . acetaminophen (TYLENOL) suppository 650 mg  650 mg Rectal Q6H PRN Agbata, Tochukwu, MD      . albuterol (VENTOLIN HFA) 108 (90 Base) MCG/ACT inhaler 2 puff  2 puff Inhalation Q6H Agbata, Tochukwu, MD   2 puff at 08/16/20 1426  . ascorbic acid (VITAMIN C) tablet 500 mg  500 mg Oral Daily Agbata, Tochukwu, MD   500 mg at 08/16/20 0830  . atorvastatin (LIPITOR) tablet 20 mg  20 mg  Oral q1800 Agbata, Tochukwu, MD   20 mg at 08/15/20 1806  . carvedilol (COREG) tablet 3.125 mg  3.125 mg Oral BID WC Agbata, Tochukwu, MD   3.125 mg at 08/14/20 1308  . dexamethasone (DECADRON) tablet 6 mg  6 mg Oral Daily Enzo Bi, MD   6 mg at 08/16/20 1204  . ferrous sulfate tablet 325 mg  325 mg Oral Daily Agbata, Tochukwu, MD   325 mg at 08/16/20 0830  . guaiFENesin-dextromethorphan (ROBITUSSIN DM) 100-10 MG/5ML syrup 10 mL  10 mL Oral Q4H PRN Agbata, Tochukwu, MD   10 mL at 08/15/20 2117  . hydrOXYzine (ATARAX/VISTARIL) tablet 25 mg  25 mg Oral Q6H PRN Lang Snow, NP      . insulin aspart (novoLOG) injection 0-15 Units  0-15 Units Subcutaneous TID WC Enzo Bi, MD   8 Units at 08/16/20 1204  . lactulose (CHRONULAC) 10 GM/15ML solution 60 g  60 g Oral Daily Agbata, Tochukwu, MD   60 g at 08/16/20 0828  . pantoprazole (PROTONIX) EC tablet 40 mg  40 mg Oral BID  Collier Bullock, MD   40 mg at 08/16/20 0830  . potassium chloride SA (KLOR-CON) CR tablet 20 mEq  20 mEq Oral BID Agbata, Tochukwu, MD   20 mEq at 08/16/20 0830  . remdesivir 100 mg in sodium chloride 0.9 % 100 mL IVPB  100 mg Intravenous Daily Agbata, Tochukwu, MD 200 mL/hr at 08/16/20 0840 100 mg at 08/16/20 0840  . rifaximin (XIFAXAN) tablet 550 mg  550 mg Oral BID Agbata, Tochukwu, MD   550 mg at 08/16/20 0830  . sodium chloride (OCEAN) 0.65 % nasal spray 1 spray  1 spray Each Nare BID PRN Agbata, Tochukwu, MD      . sodium chloride flush (NS) 0.9 % injection 3 mL  3 mL Intravenous Q12H Agbata, Tochukwu, MD   3 mL at 08/16/20 0832  . sodium chloride flush (NS) 0.9 % injection 3 mL  3 mL Intravenous PRN Agbata, Tochukwu, MD      . tamsulosin (FLOMAX) capsule 0.4 mg  0.4 mg Oral QPC supper Agbata, Tochukwu, MD   0.4 mg at 08/15/20 1806  . torsemide (DEMADEX) tablet 100 mg  100 mg Oral q AM Agbata, Tochukwu, MD   100 mg at 08/16/20 0513  . torsemide (DEMADEX) tablet 40 mg  40 mg Oral QPM Agbata, Tochukwu, MD   40 mg at  08/15/20 1807  . zinc sulfate capsule 220 mg  220 mg Oral Daily Agbata, Tochukwu, MD   220 mg at 08/16/20 0830     Discharge Medications: Please see discharge summary for a list of discharge medications.  Relevant Imaging Results:  Relevant Lab Results:   Additional Information SS#: [id number]  Shelbie Hutching, RN

## 2020-08-16 NOTE — Progress Notes (Addendum)
Inpatient Diabetes Program Recommendations  AACE/ADA: New Consensus Statement on Inpatient Glycemic Control (2015)  Target Ranges:  Prepandial:   less than 140 mg/dL      Peak postprandial:   less than 180 mg/dL (1-2 hours)      Critically ill patients:  140 - 180 mg/dL   Lab Results  Component Value Date   GLUCAP 187 (H) 08/16/2020   HGBA1C 5.7 (H) 05/19/2020    Review of Glycemic Control Results for AVIN, GIBBONS (MRN [id number]) as of 08/16/2020 10:15  Ref. Range 08/15/2020 16:36 08/15/2020 19:41 08/16/2020 00:42 08/16/2020 04:36 08/16/2020 07:24  Glucose-Capillary Latest Ref Range: 70 - 99 mg/dL 263 (H) 307 (H) 272 (H) 227 (H) 187 (H)   Diabetes history: DM 2 Outpatient Diabetes medications:  Novolog 70/30 mix 84 units bid Current orders for Inpatient glycemic control:  Novolog moderate tid with meals Decadron 6 mg daily  Inpatient Diabetes Program Recommendations:     Consider adding Levemir 6 units bid.   Note hypoglycemia.  It appears that patient was on 70/30 insulin in the SNF-  Would not resume this at d/c.  Will need reduction in insulin at discharge and possibly only low dose basal insulin plus Novolog correction.   Thanks,  Adah Perl, RN, BC-ADM Inpatient Diabetes Coordinator Pager [phone] (8a-5p)

## 2020-08-16 NOTE — Consult Note (Addendum)
[place] Nurse Consult Note: Reason for Consult: Consult requested for leg wraps.  Pt is in isolation for Covid and is familiar to [facility] team from previous admissions; refer to consult note on 11/9 Pt was incontinent lrge amt watery liquid brown stool which has drained to ankles and up middle back, cleaned patient and changed bed linen. Wound type: Removed bilat Una boots.  There is no further edema, weeping, or open wounds.  No role for further topical treatment or Una boots.  Please re-consult if further assistance is needed.  Thank-you,  Julien Girt MSN, [place], [place], [place], [place]

## 2020-08-16 NOTE — Progress Notes (Signed)
PROGRESS NOTE    Corey West  HGD:[id number] DOB: Nov 05, 1950 DOA: 08/14/2020 PCP: Leonel Ramsay, MD    Assessment & Plan:   Principal Problem:   Acute metabolic encephalopathy Active Problems:   Gastroesophageal reflux disease   Chronic venous stasis dermatitis of both lower extremities   Obesity, Class III, BMI 40-49.9 (morbid obesity) (HCC)   Chronic diastolic CHF (congestive heart failure) (HCC)   Type 2 diabetes mellitus with chronic kidney disease (HCC)   Anemia of chronic disease   Hypoglycemia   Pneumonia due to COVID-19 virus    Corey West is a 69 y.o. male with medical history significantforlivercirrhosis complicated bysevereportal hypertensive gastropathy, hepatic encephalopathyandesophageal varices, chronic diastolic heart failure, chronic lower extremityvenous stasis , type 2 diabetes mellitus with complications of stage III chronic kidney disease, hypertension, gastric antral vascular ectasias, chronic multifactorial anemia with baseline hemoglobin of 7-8, generalized anxiety disorder, BPH,whowas recently discharged [facility] on 11/10/2021to a skilled nursing facility . Patient was brought to the ER for evaluation of mental status changes.  Patient was found unresponsive by nursing home staff (time not documented), last known well was about 7 PM the night prior to his admission.  He was noted to be hypoglycemic with blood glucose of 33 and was started on D10 infusion by EMS.  By the time patient arrived the ER is blood sugar had improved to 165.    Acute metabolic encephalopathy, resolved Secondary to hypoglycemia related to insulin administration --was started on D10 infusion, since d/c'ed. PLAN: --start Levemir 6u BID --change SSI to TID  COVID-19 pneumonia with acute hypoxic respiratory failure Patient's COVID-19 PCR test is positive and he is hypoxic with room air pulse oximetry of 88% that improved following oxygen  supplementation at 2 L. He is vaccinated and received 2 doses of the Pfizer vaccine Chest x-ray showed low volume film with cardiomegaly and vascular congestion.  Patchy retrocardiac atelectasis or infiltrate at the left base. --started on Remdesivir and IV decadron --CRP 3.4 on presentation. PLAN: --cont Remdesivir --cont steroid as oral Decadron --Supportive care with antitussives as well as as needed bronchodilator therapy --Continue oxygen supplementation to maintain pulse oximetry greater than 92%  History of liver cirrhosis With complications of severe portal gastropathy/esophageal varices/pancytopenia/hepatic encephalopathy --cont torsemide --cont home rifaximin and lactulose --low-sodium diet --cont PPI BID  Morbid obesity(BMI 40 - 96.2) Complicates overall prognosis and care  Anemia of chronic disease --Hgb 7-8's --Monitor and transfuse with goal Hgb >7  Anxiety disorder Hold lorazepam for now due to mental status changes  Diabetes mellitus with complications of chronic kidney disease --start Levemir 6u BID --change SSI to TID  CKD 4 --Cr stable  Chronic venous stasis dermatitis Patient's lower extremities were wrapped with dressing and ACE on presentation. --wound care consult --wound care as ordered    DVT prophylaxis: SCD/Compression stockings Code Status: Full code  Family Communication:  Status is: inpatient Dispo:   The patient is from: SNF Anticipated d/c is to: SNF (the SNF where pt came from refuses to take pt back, so new search initiated).  Anticipated d/c date is: >3 days Patient currently is not medically stable to d/c due to: COVID receiving Remdesivir   Subjective and Interval History:  Pt was awake, alert, back to mental status baseline this morning.  Having oral intake.  No respiratory complaints.     Objective: Vitals:   08/16/20 0041 08/16/20 0436 08/16/20 0724 08/16/20 1100  BP: (!) 144/60 (!) 142/81 135/65 Marland Kitchen)  147/57   Pulse: (!) 53 (!) 53 (!) 50 (!) 51  Resp: 17 16  (!) 21  Temp: 98.3 F (36.8 C) 98.3 F (36.8 C) 98.1 F (36.7 C) (!) 97.5 F (36.4 C)  TempSrc:   Oral   SpO2: 100% 99% 100% 99%  Weight:      Height:        Intake/Output Summary (Last 24 hours) at 08/16/2020 1404 Last data filed at 08/16/2020 1146 Gross per 24 hour  Intake 100 ml  Output 1700 ml  Net -1600 ml   Filed Weights   08/14/20 0627  Weight: 136 kg    Examination:   Constitutional: NAD, AAOx3 HEENT: conjunctivae and lids normal, EOMI CV: No cyanosis.   RESP: normal respiratory effort, on 2L Extremities: both lower legs wrapped with dressing and ACE SKIN: warm, dry and intact Neuro: II - XII grossly intact.   Psych: Normal mood and affect.      Data Reviewed: I have personally reviewed following labs and imaging studies  CBC: Recent Labs  Lab 08/14/20 0637 08/15/20 0611  WBC 3.3* 5.8  NEUTROABS 2.6 5.3  HGB 8.7* 7.5*  HCT 27.1* 23.2*  MCV 90.6 91.0  PLT 101* 82*   Basic Metabolic Panel: Recent Labs  Lab 08/10/20 0519 08/14/20 0637 08/15/20 0611 08/16/20 0523  NA 143 136 140 141  K 3.8 3.5 4.3 4.9  CL 107 101 105 104  CO2 27 27 27 29   GLUCOSE 107* 79 213* 235*  BUN 58* 50* 52* 65*  CREATININE 2.68* 2.77* 2.85* 2.87*  CALCIUM 7.9* 7.7* 7.6* 8.1*  MG  --   --  1.6* 2.1  PHOS 3.3  --  3.6  --    GFR: Estimated Creatinine Clearance: 33.7 mL/min (A) (by C-G formula based on SCr of 2.87 mg/dL (H)). Liver Function Tests: Recent Labs  Lab 08/10/20 0519 08/14/20 0637 08/15/20 0611  AST  --  45* 34  ALT  --  19 16  ALKPHOS  --  83 66  BILITOT  --  1.0 0.7  PROT  --  6.4* 5.8*  ALBUMIN 2.3* 2.3* 2.1*   No results for input(s): LIPASE, AMYLASE in the last 168 hours. Recent Labs  Lab 08/14/20 0637  AMMONIA 28   Coagulation Profile: No results for input(s): INR, PROTIME in the last 168 hours. Cardiac Enzymes: No results for input(s): CKTOTAL, CKMB, CKMBINDEX, TROPONINI in the  last 168 hours. BNP (last 3 results) No results for input(s): PROBNP in the last 8760 hours. HbA1C: No results for input(s): HGBA1C in the last 72 hours. CBG: Recent Labs  Lab 08/15/20 1941 08/16/20 0042 08/16/20 0436 08/16/20 0724 08/16/20 1143  GLUCAP 307* 272* 227* 187* 257*   Lipid Profile: No results for input(s): CHOL, HDL, LDLCALC, TRIG, CHOLHDL, LDLDIRECT in the last 72 hours. Thyroid Function Tests: No results for input(s): TSH, T4TOTAL, FREET4, T3FREE, THYROIDAB in the last 72 hours. Anemia Panel: Recent Labs    08/15/20 0611  FERRITIN 124   Sepsis Labs: Recent Labs  Lab 08/14/20 1737  PROCALCITON 0.14    Recent Results (from the past 240 hour(s))  Respiratory Panel by RT PCR (Flu A&B, Covid) - Nasopharyngeal Swab     Status: Abnormal   Collection Time: 08/14/20  6:30 AM   Specimen: Nasopharyngeal Swab  Result Value Ref Range Status   SARS Coronavirus 2 by RT PCR POSITIVE (A) NEGATIVE Final    Comment: RESULT CALLED TO, READ BACK BY AND VERIFIED WITH:  ANNIE SMITH AT 5400 08/14/20 SDR (NOTE) SARS-CoV-2 target nucleic acids are DETECTED.  SARS-CoV-2 RNA is generally detectable in upper respiratory specimens  during the acute phase of infection. Positive results are indicative of the presence of the identified virus, but do not rule out bacterial infection or co-infection with other pathogens not detected by the test. Clinical correlation with patient history and other diagnostic information is necessary to determine patient infection status. The expected result is Negative.  Fact Sheet for Patients:  PinkCheek.be  Fact Sheet for Healthcare Providers: GravelBags.it  This test is not yet approved or cleared by the [place] FDA and  has been authorized for detection and/or diagnosis of SARS-CoV-2 by FDA under an Emergency Use Authorization (EUA).  This EUA will remain in effect (meaning  this test can be u sed) for the duration of  the COVID-19 declaration under Section 564(b)(1) of the Act, 21 U.S.C. section 360bbb-3(b)(1), unless the authorization is terminated or revoked sooner.      Influenza A by PCR NEGATIVE NEGATIVE Final   Influenza B by PCR NEGATIVE NEGATIVE Final    Comment: (NOTE) The Xpert Xpress SARS-CoV-2/FLU/RSV assay is intended as an aid in  the diagnosis of influenza from Nasopharyngeal swab specimens and  should not be used as a sole basis for treatment. Nasal washings and  aspirates are unacceptable for Xpert Xpress SARS-CoV-2/FLU/RSV  testing.  Fact Sheet for Patients: PinkCheek.be  Fact Sheet for Healthcare Providers: GravelBags.it  This test is not yet approved or cleared by the [place] FDA and  has been authorized for detection and/or diagnosis of SARS-CoV-2 by  FDA under an Emergency Use Authorization (EUA). This EUA will remain  in effect (meaning this test can be used) for the duration of the  Covid-19 declaration under Section 564(b)(1) of the Act, 21  U.S.C. section 360bbb-3(b)(1), unless the authorization is  terminated or revoked. Performed at [facility], [address], [place], [place] [address]       Radiology Studies: No results found.   Scheduled Meds: . albuterol  2 puff Inhalation Q6H  . vitamin C  500 mg Oral Daily  . atorvastatin  20 mg Oral q1800  . carvedilol  3.125 mg Oral BID WC  . dexamethasone  6 mg Oral Daily  . ferrous sulfate  325 mg Oral Daily  . insulin aspart  0-15 Units Subcutaneous TID WC  . lactulose  60 g Oral Daily  . pantoprazole  40 mg Oral BID  . potassium chloride SA  20 mEq Oral BID  . rifaximin  550 mg Oral BID  . sodium chloride flush  3 mL Intravenous Q12H  . tamsulosin  0.4 mg Oral QPC supper  . torsemide  100 mg Oral q AM  . torsemide  40 mg Oral QPM  . zinc sulfate  220 mg Oral Daily   Continuous  Infusions: . sodium chloride    . remdesivir 100 mg in NS 100 mL 100 mg (08/16/20 0840)     LOS: 2 days     Enzo Bi, MD [facility] If 7PM-7AM, please contact night-coverage 08/16/2020, 2:04 PM

## 2020-08-17 LAB — BASIC METABOLIC PANEL
Anion gap: 7 (ref 5–15)
BUN: 67 mg/dL — ABNORMAL HIGH (ref 8–23)
CO2: 29 mmol/L (ref 22–32)
Calcium: 7.8 mg/dL — ABNORMAL LOW (ref 8.9–10.3)
Chloride: 101 mmol/L (ref 98–111)
Creatinine, Ser: 2.64 mg/dL — ABNORMAL HIGH (ref 0.61–1.24)
GFR, Estimated: 26 mL/min — ABNORMAL LOW (ref >60)
Glucose, Bld: 378 mg/dL — ABNORMAL HIGH (ref 70–99)
Potassium: 4.7 mmol/L (ref 3.5–5.1)
Sodium: 137 mmol/L (ref 135–145)

## 2020-08-17 LAB — GLUCOSE, CAPILLARY
Glucose-Capillary: 326 mg/dL — ABNORMAL HIGH (ref 70–99)
Glucose-Capillary: 375 mg/dL — ABNORMAL HIGH (ref 70–99)
Glucose-Capillary: 282 mg/dL — ABNORMAL HIGH (ref 70–99)
Glucose-Capillary: 204 mg/dL — ABNORMAL HIGH (ref 70–99)

## 2020-08-17 LAB — FIBRIN DERIVATIVES D-DIMER (ARMC ONLY): Fibrin derivatives D-dimer (ARMC): 1645.94 ng/mL (FEU) — ABNORMAL HIGH (ref 0.00–499.00)

## 2020-08-17 LAB — MAGNESIUM: Magnesium: 1.8 mg/dL (ref 1.7–2.4)

## 2020-08-17 LAB — C-REACTIVE PROTEIN: CRP: 2.2 mg/dL — ABNORMAL HIGH (ref <1.0)

## 2020-08-17 MED ORDER — INSULIN ASPART PROT & ASPART (70-30 MIX) 100 UNIT/ML ~~LOC~~ SUSP
50.0000 [IU] | Freq: Two times a day (BID) | SUBCUTANEOUS | Status: DC
  Administered 2020-08-17 – 2020-08-18 (×3): 50 [IU] via SUBCUTANEOUS
  Filled 2020-08-17 (×3): qty 10

## 2020-08-17 MED ORDER — INSULIN DETEMIR 100 UNIT/ML ~~LOC~~ SOLN
6.0000 [IU] | Freq: Two times a day (BID) | SUBCUTANEOUS | Status: DC
  Filled 2020-08-17 (×2): qty 0.06

## 2020-08-17 MED ORDER — INSULIN ASPART PROT & ASPART (70-30 MIX) 100 UNIT/ML PEN: 50.0000 [IU] | PEN_INJECTOR | Freq: Two times a day (BID) | SUBCUTANEOUS | Status: DC

## 2020-08-17 MED ORDER — INSULIN ASPART PROT & ASPART (70-30 MIX) 100 UNIT/ML PEN: 84.0000 [IU] | PEN_INJECTOR | Freq: Two times a day (BID) | SUBCUTANEOUS | Status: DC

## 2020-08-17 NOTE — Plan of Care (Signed)

## 2020-08-17 NOTE — TOC Progression Note (Signed)
Transition of Care [facility]) - Progression Note    Patient Details  Name: Corey West MRN: [id number] Date of Birth: [birth year]  Transition of [facility]) CM/SW Contact  Shelbie Hutching, RN Phone Number: 08/17/2020, 2:40 PM  Clinical Narrative:     [facility] in [place] has agreed to accept patient for short term rehab.  They can take him tomorrow.   Transport has been arranged for 11 am in the morning with [employer].  Patient and niece updated and aware of discharge plan.  Patient will be going to a private room and can return to [facility] once his isolation is over.   Expected Discharge Plan: Skilled Nursing Facility Barriers to Discharge: SNF Pending bed offer, Continued Medical Work up  Expected Discharge Plan and Services Expected Discharge Plan: [place]   Discharge Planning Services: CM Consult Post Acute Care Choice: [place] Living arrangements for the past 2 months: Single Family Home                           HH Arranged: NA           Social Determinants of Health (SDOH) Interventions    Readmission Risk Interventions Readmission Risk Prevention Plan 08/16/2020 07/27/2020 06/09/2020  Transportation Screening Complete Complete Complete  PCP or Specialist Appt within 3-5 Days - - -  HRI or [place] Work Consult for [place] Planning/Counseling - - -  [place] - - -  Medication Review Press photographer) Complete Complete Complete  PCP or Specialist appointment within 3-5 days of discharge Complete Complete Complete  HRI or Home Care Consult Complete Complete Complete  SW Recovery Care/Counseling Consult Complete Complete Complete  SW Consult Not Complete Comments - - -  Palliative Care Screening Complete Not Applicable Not Applicable  Skilled Nursing Facility Complete Complete Not Applicable  Some recent data might be hidden

## 2020-08-17 NOTE — Progress Notes (Signed)
[facility] [place] NAME: Corey West    MR#:  [id number]  DATE OF BIRTH:  06/18/1951  SUBJECTIVE:  Per RN patient has been asking for [place] ice freq. Pt tells me he was not aware for sugar in it! Denies any shortness of breath or chest pain.  REVIEW OF SYSTEMS:   Review of Systems  Constitutional: Negative for chills, fever and weight loss.  HENT: Negative for ear discharge, ear pain and nosebleeds.   Eyes: Negative for blurred vision, pain and discharge.  Respiratory: Positive for shortness of breath. Negative for sputum production, wheezing and stridor.   Cardiovascular: Negative for chest pain, palpitations, orthopnea and PND.  Gastrointestinal: Negative for abdominal pain, diarrhea, nausea and vomiting.  Genitourinary: Negative for frequency and urgency.  Musculoskeletal: Negative for back pain and joint pain.  Neurological: Positive for weakness. Negative for sensory change, speech change and focal weakness.  Psychiatric/Behavioral: Negative for depression and hallucinations. The patient is not nervous/anxious.    Tolerating Diet:yesTolerating PT: recommends rehab  DRUG ALLERGIES:  No Known Allergies  VITALS:  Blood pressure (!) 169/72, pulse (!) 57, temperature 97.8 F (36.6 C), resp. rate 20, height 5\' 9"  (1.753 m), weight 136 kg, SpO2 100 %.  PHYSICAL EXAMINATION:   Physical Exam  GENERAL:  69 y.o.-year-old patient lying in the bed with no acute distress. Morbid obesity HEENT: Head atraumatic, normocephalic. Oropharynx and nasopharynx clear.  NECK:  Supple, no jugular venous distention. No thyroid enlargement, no tenderness.  LUNGS: Normal breath sounds bilaterally, no wheezing, rales, rhonchi. No use of accessory muscles of respiration.  CARDIOVASCULAR: S1, S2 normal. No murmurs, rubs, or gallops.  ABDOMEN: Soft, nontender, nondistended. Bowel sounds present. No organomegaly or mass.  EXTREMITIES: unna boot  removed NEUROLOGIC: Cranial nerves II through XII are intact. No focal Motor or sensory deficits b/l.   PSYCHIATRIC:  patient is alert and oriented x 3.    LABORATORY PANEL:  CBC Recent Labs  Lab 08/15/20 0611  WBC 5.8  HGB 7.5*  HCT 23.2*  PLT 82*    Chemistries  Recent Labs  Lab 08/15/20 0611 08/16/20 0523 08/17/20 0554  NA 140   < > 137  K 4.3   < > 4.7  CL 105   < > 101  CO2 27   < > 29  GLUCOSE 213*   < > 378*  BUN 52*   < > 67*  CREATININE 2.85*   < > 2.64*  CALCIUM 7.6*   < > 7.8*  MG 1.6*   < > 1.8  AST 34  --   --   ALT 16  --   --   ALKPHOS 66  --   --   BILITOT 0.7  --   --    < > = values in this interval not displayed.   Cardiac Enzymes No results for input(s): TROPONINI in the last 168 hours. RADIOLOGY:  No results found. 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 Patient was brought to the ER for evaluation of mental status changes.Patient was found unresponsive by nursing home staff  He was noted to be hypoglycemic with blood glucose of 33 and was started on D10 infusion by EMS. By the time patient arrived the ER is blood sugar had improved to 165.    Acute metabolicencephalopathy, resolved Secondary to hypoglycemia related to insulin administration --was started on D10 infusion, since d/c'ed. -- resume insulin 70/30 50 units BID. -- Patient advised to comply with carb control  diet. --change SSI to TID  COVID-19pneumonia with acute hypoxic respiratory failure --He is vaccinated and received 2 doses of the Pfizer vaccine Chest x-ray showedlow volume film with cardiomegaly and vascular congestion.  Patchy retrocardiac atelectasis or infiltrate at the left base. -sats 98-99% on RA --CRP 3.4 on presentation--2.2 --cont Remdesivir --cont steroid as oral Decadron --Supportive care with antitussives as well as as needed bronchodilator therapy --Continue oxygen supplementation to maintain pulse oximetry greater than 92%  History of liver cirrhosis With complications of severe portal gastropathy/esophageal varices/pancytopenia/hepatic encephalopathy --cont torsemide --cont home rifaximin and lactulose --low-sodium diet --cont PPI BID  Morbid obesity(BMI 40 - 56.2) Complicates overall prognosis and care  Anemia of chronic disease --Hgb 7-8's --Monitor and transfuse with goal Hgb >7  Anxiety disorder Hold lorazepam for now due to mental status changes  Diabetes mellitus with complications of chronic kidney disease --start Levemir 6u BID --change SSI to TID  CKD 4 --Cr stable  Chronic venous stasis dermatitis Patient's lower extremities were wrapped with dressing and ACE on presentation. --wound care consult appreciated--"Wound type: Removed bilat Una boots.  There is no further edema, weeping, or open wounds.  No role for further topical treatment or Una boots. "  DVT prophylaxis: SCD/Compression stockings Code Status: Full code  Family Communication:  spoke with patient's niece [name] Status is: inpatient Dispo:   The patient is from: SNF Anticipated d/c is to: SNF in [place] tomorrow. Per Education officer, museum cannot arrange transportation today. Patient has transportation arranged for 11 AM on Thursday.     TOTAL TIME TAKING CARE OF THIS PATIENT: *20* minutes.  >50% time spent on counselling and coordination of care  Note: This dictation  was prepared with Dragon dictation along with smaller phrase technology. Any transcriptional errors that result from this process are unintentional.  Fritzi Mandes M.D    [facility]   CC: Primary care physician; Leonel Ramsay, MDPatient ID: [name], male   DOB: 1951/01/19, 69 y.o.   MRN: [id number]

## 2020-08-18 LAB — FIBRIN DERIVATIVES D-DIMER (ARMC ONLY): Fibrin derivatives D-dimer (ARMC): 2342.56 ng/mL (FEU) — ABNORMAL HIGH (ref 0.00–499.00)

## 2020-08-18 LAB — MAGNESIUM: Magnesium: 1.8 mg/dL (ref 1.7–2.4)

## 2020-08-18 LAB — C-REACTIVE PROTEIN: CRP: 1.5 mg/dL — ABNORMAL HIGH (ref <1.0)

## 2020-08-18 LAB — GLUCOSE, CAPILLARY: Glucose-Capillary: 175 mg/dL — ABNORMAL HIGH (ref 70–99)

## 2020-08-18 MED ORDER — NOVOLOG MIX 70/30 FLEXPEN (70-30) 100 UNIT/ML ~~LOC~~ SUPN
60.0000 [IU] | PEN_INJECTOR | Freq: Two times a day (BID) | SUBCUTANEOUS | 11 refills | Status: DC
Start: 2020-08-18 — End: 2020-09-12

## 2020-08-18 MED ORDER — GUAIFENESIN-DM 100-10 MG/5ML PO SYRP: 10.0000 mL | ORAL_SOLUTION | ORAL | 0 refills | Status: DC | PRN

## 2020-08-18 MED ORDER — DEXAMETHASONE 6 MG PO TABS
6.0000 mg | ORAL_TABLET | Freq: Every day | ORAL | 0 refills | Status: DC
Start: 2020-08-18 — End: 2020-09-12

## 2020-08-18 MED ORDER — SODIUM CHLORIDE 0.9 % IV SOLN
100.0000 mg | Freq: Every day | INTRAVENOUS | Status: AC
  Administered 2020-08-18: 09:00:00 100 mg via INTRAVENOUS
  Filled 2020-08-18: qty 20

## 2020-08-18 MED ORDER — ALBUTEROL SULFATE HFA 108 (90 BASE) MCG/ACT IN AERS
2.0000 | INHALATION_SPRAY | Freq: Four times a day (QID) | RESPIRATORY_TRACT | 0 refills | Status: DC
Start: 2020-08-18 — End: 2020-09-12

## 2020-08-18 NOTE — Progress Notes (Signed)
Patient received discharge instructions to go to nursing facility

## 2020-08-18 NOTE — Discharge Summary (Signed)
[facility] [place] NAME: Corey West    MR#:  [id number]  DATE OF BIRTH:  07-Apr-1951  DATE OF ADMISSION:  08/14/2020 ADMITTING PHYSICIAN: Corey Bullock, MD  DATE OF DISCHARGE: 08/18/2020  PRIMARY CARE PHYSICIAN: Corey Ramsay, MD    ADMISSION DIAGNOSIS:  SOB (shortness of breath) [R06.02] Hypoglycemia [E16.2] Disorientation [O87.8] Acute metabolic encephalopathy [M76.72]  DISCHARGE DIAGNOSIS:  acute metabolic encephalopathy secondary to hypoglycemia resolved COVID 19 pneumonia  SECONDARY DIAGNOSIS:   Past Medical History:  Diagnosis Date  . A-fib ([place])   . Anemia   . Anxiety    controlled;   . Arthritis   . AVM (arteriovenous malformation) of stomach, acquired with hemorrhage   . Barrett's esophagus   . CHF (congestive heart failure) ([place])   . Chronic kidney disease    renal infufficiency  . Cirrhosis ([place])   . COPD (chronic obstructive pulmonary disease) ([place])   . Depression    controlled;   Marland Kitchen Diabetes mellitus without complication ([place])    not controlled, taking insulin but sugar continues to run high;   Marland Kitchen Dysrhythmia   . Edema   . Esophageal varices ([place])   . GAVE (gastric antral vascular ectasia)   . GERD (gastroesophageal reflux disease)   . Hepatic encephalopathy ([place])   . History of hiatal hernia   . Hyperlipidemia   . Hypertension    controlled well;   . IPMN (intraductal papillary mucinous neoplasm)   . Liver cirrhosis ([place])   . Nephropathy, diabetic ([place])   . Obesity   . Pancytopenia ([place])   . Polyp, stomach    with chronic blood loss  . Portal hypertensive gastropathy ([place])   . Sleep apnea    does not wear a cpap, Medicare would not pay for it   . Venous stasis dermatitis of both lower extremities   . 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 Patient was brought to the ER for evaluation of mental status changes.Patient was found unresponsive by nursing home staff He was noted to be hypoglycemic with blood glucose of 33 and was started on D10 infusion by EMS. By the time patient arrived the ER is blood sugar had improved to 165.   Acute metabolicencephalopathy, resolved Secondary to hypoglycemia related to insulin administration --was started on D10 infusion, since d/c'ed. -- resumed insulin 70/30 60 units BID. -- Patient advised to comply with carb control diet. --change SSI to TID  COVID-19pneumonia with acute hypoxic respiratory failure --He is vaccinated and received 2 doses of the Pfizer vaccine Chest x-ray showedlow volume film with cardiomegaly and vascular congestion. Patchy retrocardiac atelectasis or infiltrate at the left base. -sats 98-99% on RA --CRP 3.4 on presentation--2.2 --pt will complete IV Remdesivir today --cont  oral Decadron --Supportive care with antitussives as well as as needed bronchodilator therapy --oxygen prn  History of liver cirrhosis With complications of severe portal gastropathy/esophageal varices/pancytopenia/hepatic encephalopathy --cont torsemide --cont home rifaximin and lactulose --low-sodium diet --cont PPI BID  Morbid obesity(BMI 40 - 09.4) Complicates overall  prognosis and care  Anemia of chronic disease --Hgb 7-8's --Monitor and transfuse with goal Hgb >7  Anxiety  disorder Hold lorazepam for now due to mental status changes  Diabetes mellitus with complications of chronic kidney disease --Insulin 70/30 60 units bid -- SSI  CKD 4 --Cr stable  Chronic venous stasis dermatitis Patient's lower extremitieswerewrappedwith dressing and ACE on presentation. --wound care consult appreciated--"Wound type:Removed bilat Una boots. There is no further edema, weeping, or open wounds. No role for further topical treatment or Unna boots. "  DVT prophylaxis:SCD/Compression stockings Code Status:Full code Family Communication: spoke with patient's niece Corey West yday. PEr TOC  She is aware pt is going to rehab in [place] today at 11 am Status DD:[id number] Dispo: The patient is from: SNF Anticipated d/c is to: SNF in [place] today Pt is in agreement as of today am.  CONSULTS OBTAINED:    DRUG ALLERGIES:  No Known Allergies  DISCHARGE MEDICATIONS:   Allergies as of 08/18/2020   No Known Allergies     Medication List    STOP taking these medications   LORazepam 0.5 MG tablet Commonly known as: ATIVAN     TAKE these medications   albuterol 108 (90 Base) MCG/ACT inhaler Commonly known as: VENTOLIN HFA Inhale 2 puffs into the lungs every 6 (six) hours.   atorvastatin 20 MG tablet Commonly known as: LIPITOR Take 20 mg by mouth daily at 6 PM.   carvedilol 3.125 MG tablet Commonly known as: COREG Take 1 tablet (3.125 mg total) by mouth 2 (two) times daily with a meal.   dexamethasone 6 MG tablet Commonly known as: DECADRON Take 1 tablet (6 mg total) by mouth daily.   ferrous sulfate 325 (65 FE) MG tablet Take 1 tablet (325 mg total) by mouth daily. This medication can cause black stools   guaiFENesin-dextromethorphan 100-10 MG/5ML syrup Commonly known as: ROBITUSSIN DM Take 10 mLs by mouth every 4 (four) hours as needed for cough.   lactulose 10 GM/15ML solution Commonly known as: CHRONULAC Take 90 mLs (60 g total) by  mouth daily.   NovoLOG Mix 70/30 FlexPen (70-30) 100 UNIT/ML FlexPen Generic drug: insulin aspart protamine - aspart Inject 0.6 mLs (60 Units total) into the skin 2 (two) times daily. What changed: how much to take   pantoprazole 40 MG tablet Commonly known as: Protonix Take 1 tablet (40 mg total) by mouth 2 (two) times daily.   potassium chloride SA 20 MEQ tablet Commonly known as: KLOR-CON Take 20 mEq by mouth 2 (two) times daily.   rifaximin 550 MG Tabs tablet Commonly known as: XIFAXAN Take 1 tablet (550 mg total) by mouth 2 (two) times daily.   sodium chloride 0.65 % Soln nasal spray Commonly known as: OCEAN Place 1 spray into both nostrils as needed for congestion (nose irritation). What changed: when to take this   tamsulosin 0.4 MG Caps capsule Commonly known as: FLOMAX Take 1 capsule (0.4 mg total) by mouth daily after supper.   torsemide 100 MG tablet Commonly known as: DEMADEX Take 1 tablet (100 mg total) by mouth in the morning.   torsemide 20 MG tablet Commonly known as: DEMADEX Take 2 tablets (40 mg total) by mouth every evening.            Discharge Care Instructions  (From admission, onward)         Start     Ordered   08/18/20 0000  Discharge wound care:       Comments:  Per wound RN--No Unna boot is required at present   08/18/20 0855          If you experience worsening of your admission symptoms, develop shortness of breath, life threatening emergency, suicidal or homicidal thoughts you must seek medical attention immediately by calling 911 or calling your MD immediately  if symptoms less severe.  You Must read complete instructions/literature along with all the possible adverse reactions/side effects for all the Medicines you take and that have been prescribed to you. Take any new Medicines after you have completely understood and accept all the possible adverse reactions/side effects.   Please note  You were cared for by a hospitalist  during your hospital stay. If you have any questions about your discharge medications or the care you received while you were in the hospital after you are discharged, you can call the unit and asked to speak with the hospitalist on call if the hospitalist that took care of you is not available. Once you are discharged, your primary care physician will handle any further medical issues. Please note that NO REFILLS for any discharge medications will be authorized once you are discharged, as it is imperative that you return to your primary care physician (or establish a relationship with a primary care physician if you do not have one) for your aftercare needs so that they can reassess your need for medications and monitor your lab values. Today   SUBJECTIVE  Doing well. No new issues per RN   VITAL SIGNS:  Blood pressure (!) 145/57, pulse (!) 59, temperature (!) 97.5 F (36.4 C), temperature source Oral, resp. rate 20, height 5\' 9"  (1.753 m), weight 136 kg, SpO2 99 %.  I/O:  No intake or output data in the 24 hours ending 08/18/20 0901  PHYSICAL EXAMINATION:   GENERAL:  69 y.o.-year-old patient lying in the bed with no acute distress. Morbid obesity HEENT: Head atraumatic, normocephalic. Oropharynx and nasopharynx clear.  NECK:  Supple, no jugular venous distention. No thyroid enlargement, no tenderness.  LUNGS: Normal breath sounds bilaterally, no wheezing, rales, rhonchi. No use of accessory muscles of respiration.  CARDIOVASCULAR: S1, S2 normal. No murmurs, rubs, or gallops.  ABDOMEN: Soft, nontender, nondistended. Bowel sounds present. No organomegaly or mass.  EXTREMITIES: unna boot removed NEUROLOGIC: Cranial nerves II through XII are intact. No focal Motor or sensory deficits b/l.   PSYCHIATRIC:  patient is alert and oriented x 3.   DATA REVIEW:   CBC  Recent Labs  Lab 08/15/20 0611  WBC 5.8  HGB 7.5*  HCT 23.2*  PLT 82*    Chemistries  Recent Labs  Lab 08/15/20 0611  08/16/20 0523 08/17/20 0554 08/17/20 0554 08/18/20 0618  NA 140   < > 137  --   --   K 4.3   < > 4.7  --   --   CL 105   < > 101  --   --   CO2 27   < > 29  --   --   GLUCOSE 213*   < > 378*  --   --   BUN 52*   < > 67*  --   --   CREATININE 2.85*   < > 2.64*  --   --   CALCIUM 7.6*   < > 7.8*  --   --   MG 1.6*   < > 1.8   < > 1.8  AST 34  --   --   --   --  ALT 16  --   --   --   --   ALKPHOS 66  --   --   --   --   BILITOT 0.7  --   --   --   --    < > = values in this interval not displayed.    Microbiology Results   Recent Results (from the past 240 hour(s))  Respiratory Panel by RT PCR (Flu A&B, Covid) - Nasopharyngeal Swab     Status: Abnormal   Collection Time: 08/14/20  6:30 AM   Specimen: Nasopharyngeal Swab  Result Value Ref Range Status   SARS Coronavirus 2 by RT PCR POSITIVE (A) NEGATIVE Final    Comment: RESULT CALLED TO, READ BACK BY AND VERIFIED WITH:  ANNIE SMITH AT 0801 08/14/20 SDR (NOTE) SARS-CoV-2 target nucleic acids are DETECTED.  SARS-CoV-2 RNA is generally detectable in upper respiratory specimens  during the acute phase of infection. Positive results are indicative of the presence of the identified virus, but do not rule out bacterial infection or co-infection with other pathogens not detected by the test. Clinical correlation with patient history and other diagnostic information is necessary to determine patient infection status. The expected result is Negative.  Fact Sheet for Patients:  PinkCheek.be  Fact Sheet for Healthcare Providers: GravelBags.it  This test is not yet approved or cleared by the [place] FDA and  has been authorized for detection and/or diagnosis of SARS-CoV-2 by FDA under an Emergency Use Authorization (EUA).  This EUA will remain in effect (meaning this test can be u sed) for the duration of  the COVID-19 declaration under Section 564(b)(1) of the Act,  21 U.S.C. section 360bbb-3(b)(1), unless the authorization is terminated or revoked sooner.      Influenza A by PCR NEGATIVE NEGATIVE Final   Influenza B by PCR NEGATIVE NEGATIVE Final    Comment: (NOTE) The Xpert Xpress SARS-CoV-2/FLU/RSV assay is intended as an aid in  the diagnosis of influenza from Nasopharyngeal swab specimens and  should not be used as a sole basis for treatment. Nasal washings and  aspirates are unacceptable for Xpert Xpress SARS-CoV-2/FLU/RSV  testing.  Fact Sheet for Patients: PinkCheek.be  Fact Sheet for Healthcare Providers: GravelBags.it  This test is not yet approved or cleared by the [place] FDA and  has been authorized for detection and/or diagnosis of SARS-CoV-2 by  FDA under an Emergency Use Authorization (EUA). This EUA will remain  in effect (meaning this test can be used) for the duration of the  Covid-19 declaration under Section 564(b)(1) of the Act, 21  U.S.C. section 360bbb-3(b)(1), unless the authorization is  terminated or revoked. Performed at [facility], [address] [place], [place] [address]     RADIOLOGY:  No results found.   CODE STATUS:     Code Status Orders  (From admission, onward)         Start     Ordered   08/14/20 0830  Full code  Continuous        08/14/20 0837        Code Status History    Date Active Date Inactive Code Status Order ID Comments User Context   08/01/2020 1155 08/10/2020 1920 Full Code [id number]  Corey Bullock, MD ED   08/01/2020 1011 08/01/2020 1155 DNR [id number]  Corey Bullock, MD ED   07/25/2020 2103 07/30/2020 2119 Full Code [id number]  Rhetta Mura, DO ED   07/07/2020 2054 07/10/2020 0121 Full Code [id number]  Ivor Costa, MD Inpatient   07/07/2020 [phone] 07/07/2020 2054 Partial Code [id number]  Ivor Costa, MD ED   06/07/2020 0257 06/11/2020 1852 Full Code [id number]  Chotiner, Yevonne Aline, MD ED   05/19/2020  0508 05/23/2020 1504 Full Code [id number]  Athena Masse, MD ED   04/23/2020 1936 05/03/2020 1829 Full Code [id number]  Athena Masse, MD ED   02/23/2020 1535 02/28/2020 0350 Full Code [id number]  Ezekiel Slocumb, DO ED   01/28/2020 2254 02/12/2020 1842 Full Code [id number]  Lenore Cordia, MD Inpatient   12/14/2019 1244 12/17/2019 1814 Full Code [id number]  Ivor Costa, MD ED   11/27/2019 1619 11/30/2019 1522 Full Code [id number]  Ivor Costa, MD ED   04/28/2019 2254 04/29/2019 1722 Full Code [id number]  Loletha Grayer, MD ED   03/05/2019 1753 03/06/2019 2020 Full Code [id number]  Dustin Flock, MD Inpatient   02/03/2019 0541 02/05/2019 1436 Full Code [id number]  Harrie Foreman, MD Inpatient   01/15/2019 0047 01/15/2019 1657 Full Code [id number]  Lance Coon, MD Inpatient   12/02/2018 1713 12/09/2018 1737 Full Code [id number]  Hillary Bow, MD ED   10/16/2018 0054 10/18/2018 1842 Full Code [id number]  [name], MD Inpatient   07/18/2018 2339 07/21/2018 1356 Full Code [id number]  Lance Coon, MD Inpatient   06/18/2018 1609 06/19/2018 1717 Full Code [id number]  Epifanio Lesches, MD ED   12/31/2017 1856 01/01/2018 1205 Full Code [id number]  Saundra Shelling, MD Inpatient   12/25/2017 1456 12/26/2017 1618 Full Code [id number]  Epifanio Lesches, MD ED   12/16/2017 0921 12/18/2017 1635 DNR [id number]  Max Sane, MD Inpatient   12/16/2017 0305 12/16/2017 0921 Full Code [id number]  Salary, Avel Peace, MD ED   12/02/2017 2003 12/05/2017 2000 Full Code [id number]  Saundra Shelling, MD Inpatient   11/21/2017 1948 11/23/2017 1958 Full Code [id number]  Loletha Grayer, MD ED   10/23/2017 1405 10/26/2017 1402 Full Code [id number]  Bettey Costa, MD Inpatient   10/18/2017 0058 10/19/2017 1806 Full Code [id number]  Saundra Shelling, MD Inpatient   10/09/2017 2139 10/11/2017 1806 Full Code [id number]  [name], MD Inpatient   09/03/2017 2124 09/05/2017 1552 Full Code [id number]  Nicholes Mango, MD Inpatient   06/26/2017 1119 06/28/2017 1748  Full Code [id number]  Henreitta Leber, MD Inpatient   02/21/2017 2007 02/22/2017 1803 Full Code [id number]  [name], MD Inpatient   07/20/2015 1245 07/22/2015 2217 Full Code [id number]  Nicholes Mango, MD Inpatient   02/08/2015 2331 02/11/2015 1943 Full Code [id number]  Hower, Aaron Mose, MD ED   Advance Care Planning Activity       TOTAL TIME TAKING CARE OF THIS PATIENT: 35 minutes.    Fritzi Mandes M.D  [facility]    CC: Primary care physician; Corey Ramsay, MD

## 2020-08-18 NOTE — TOC Transition Note (Signed)
Transition of Care [facility]) - CM/SW Discharge Note   Patient Details  Name: Corey West MRN: [id number] Date of Birth: March 14, 1951  Transition of Care [facility]) CM/SW Contact:  Shelbie Hutching, RN Phone Number: 08/18/2020, 9:29 AM   Clinical Narrative:    Patient medically cleared for discharge to SNF today.  Patient is going to [name] in [place].  [employer] will be here at [employer]  for transport.  DC summary and SNF transfer report faxed to [phone].  Bedside RN will call report to [phone].   Final next level of care: Skilled Nursing Facility Barriers to Discharge: Barriers Resolved   Patient Goals and CMS Choice Patient states their goals for this hospitalization and ongoing recovery are:: Patient upset that [facility] will not accept him back- will have to find alternative placement CMS Medicare.gov Compare Post Acute Care list provided to:: Patient Choice offered to / list presented to : Patient  Discharge Placement              Patient chooses bed at: Other - please specify in the comment section below: [facility] in [place]) Patient to be transferred to facility by: [facility] Transport Name of family member notified: Patient and [name] Patient and family notified of of transfer: 08/18/20  Discharge Plan and Services   Discharge Planning Services: CM Consult Post Acute Care Choice: [place]            DME Agency: NA       HH Arranged: NA          Social Determinants of Health (SDOH) Interventions     Readmission Risk Interventions Readmission Risk Prevention Plan 08/16/2020 07/27/2020 06/09/2020  Transportation Screening Complete Complete Complete  PCP or Specialist Appt within 3-5 Days - - -  HRI or [place] Work Consult for [place] - - -  Medication Review Press photographer) Complete Complete Complete   PCP or Specialist appointment within 3-5 days of discharge Complete Complete Complete  HRI or Home Care Consult Complete Complete Complete  SW Recovery Care/Counseling Consult Complete Complete Complete  SW Consult Not Complete Comments - - -  Palliative Care Screening Complete Not Applicable Not Applicable  Skilled Nursing Facility Complete Complete Not Applicable  Some recent data might be hidden

## 2020-08-18 NOTE — Care Management Important Message (Signed)
Important Message  Patient Details  Name: Corey West MRN: [id number] Date of Birth: Oct 04, 1950   Medicare Important Message Given:  No  Patient discharged prior to attempt to review concurrent Medicare IM.  Received initial on 08/14/2020 at 3:10pm from Patient Access.    Dannette Barbara 08/18/2020, 11:55 AM

## 2020-09-06 ENCOUNTER — Emergency Department (HOSPITAL_COMMUNITY): Payer: Medicare Other

## 2020-09-06 ENCOUNTER — Inpatient Hospital Stay (HOSPITAL_COMMUNITY)
Admission: EM | Admit: 2020-09-06 | Discharge: 2020-09-12 | DRG: 441 | Disposition: A | Discharge status: Hospice | Payer: Medicare Other | Source: Skilled Nursing Facility | Attending: Internal Medicine | Admitting: Internal Medicine

## 2020-09-06 ENCOUNTER — Encounter (HOSPITAL_COMMUNITY): Payer: Self-pay

## 2020-09-06 DIAGNOSIS — Z7189 Other specified counseling: Secondary | ICD-10-CM | POA: Diagnosis not present

## 2020-09-06 DIAGNOSIS — R4182 Altered mental status, unspecified: Secondary | ICD-10-CM

## 2020-09-06 DIAGNOSIS — Z20822 Contact with and (suspected) exposure to covid-19: Secondary | ICD-10-CM | POA: Diagnosis present

## 2020-09-06 DIAGNOSIS — N184 Chronic kidney disease, stage 4 (severe): Secondary | ICD-10-CM | POA: Diagnosis present

## 2020-09-06 DIAGNOSIS — J449 Chronic obstructive pulmonary disease, unspecified: Secondary | ICD-10-CM | POA: Diagnosis present

## 2020-09-06 DIAGNOSIS — K31819 Angiodysplasia of stomach and duodenum without bleeding: Secondary | ICD-10-CM | POA: Diagnosis present

## 2020-09-06 DIAGNOSIS — K219 Gastro-esophageal reflux disease without esophagitis: Secondary | ICD-10-CM | POA: Diagnosis present

## 2020-09-06 DIAGNOSIS — K7682 Hepatic encephalopathy: Secondary | ICD-10-CM | POA: Diagnosis present

## 2020-09-06 DIAGNOSIS — K7460 Unspecified cirrhosis of liver: Secondary | ICD-10-CM | POA: Diagnosis present

## 2020-09-06 DIAGNOSIS — I130 Hypertensive heart and chronic kidney disease with heart failure and stage 1 through stage 4 chronic kidney disease, or unspecified chronic kidney disease: Secondary | ICD-10-CM | POA: Diagnosis present

## 2020-09-06 DIAGNOSIS — D696 Thrombocytopenia, unspecified: Secondary | ICD-10-CM | POA: Diagnosis not present

## 2020-09-06 DIAGNOSIS — S52021A Displaced fracture of olecranon process without intraarticular extension of right ulna, initial encounter for closed fracture: Secondary | ICD-10-CM

## 2020-09-06 DIAGNOSIS — Z794 Long term (current) use of insulin: Secondary | ICD-10-CM

## 2020-09-06 DIAGNOSIS — E785 Hyperlipidemia, unspecified: Secondary | ICD-10-CM | POA: Diagnosis present

## 2020-09-06 DIAGNOSIS — M1990 Unspecified osteoarthritis, unspecified site: Secondary | ICD-10-CM | POA: Diagnosis present

## 2020-09-06 DIAGNOSIS — K7200 Acute and subacute hepatic failure without coma: Principal | ICD-10-CM | POA: Diagnosis present

## 2020-09-06 DIAGNOSIS — E876 Hypokalemia: Secondary | ICD-10-CM | POA: Diagnosis not present

## 2020-09-06 DIAGNOSIS — Z823 Family history of stroke: Secondary | ICD-10-CM

## 2020-09-06 DIAGNOSIS — E872 Acidosis, unspecified: Secondary | ICD-10-CM

## 2020-09-06 DIAGNOSIS — I878 Other specified disorders of veins: Secondary | ICD-10-CM | POA: Diagnosis present

## 2020-09-06 DIAGNOSIS — N400 Enlarged prostate without lower urinary tract symptoms: Secondary | ICD-10-CM | POA: Diagnosis present

## 2020-09-06 DIAGNOSIS — Z833 Family history of diabetes mellitus: Secondary | ICD-10-CM

## 2020-09-06 DIAGNOSIS — E119 Type 2 diabetes mellitus without complications: Secondary | ICD-10-CM | POA: Diagnosis not present

## 2020-09-06 DIAGNOSIS — G9341 Metabolic encephalopathy: Secondary | ICD-10-CM | POA: Diagnosis present

## 2020-09-06 DIAGNOSIS — Z66 Do not resuscitate: Secondary | ICD-10-CM | POA: Diagnosis not present

## 2020-09-06 DIAGNOSIS — Z8249 Family history of ischemic heart disease and other diseases of the circulatory system: Secondary | ICD-10-CM

## 2020-09-06 DIAGNOSIS — F419 Anxiety disorder, unspecified: Secondary | ICD-10-CM | POA: Diagnosis not present

## 2020-09-06 DIAGNOSIS — I872 Venous insufficiency (chronic) (peripheral): Secondary | ICD-10-CM | POA: Diagnosis present

## 2020-09-06 DIAGNOSIS — R627 Adult failure to thrive: Secondary | ICD-10-CM | POA: Diagnosis present

## 2020-09-06 DIAGNOSIS — K766 Portal hypertension: Secondary | ICD-10-CM | POA: Diagnosis present

## 2020-09-06 DIAGNOSIS — K7290 Hepatic failure, unspecified without coma: Secondary | ICD-10-CM

## 2020-09-06 DIAGNOSIS — Z781 Physical restraint status: Secondary | ICD-10-CM

## 2020-09-06 DIAGNOSIS — D631 Anemia in chronic kidney disease: Secondary | ICD-10-CM | POA: Diagnosis present

## 2020-09-06 DIAGNOSIS — E1122 Type 2 diabetes mellitus with diabetic chronic kidney disease: Secondary | ICD-10-CM | POA: Diagnosis present

## 2020-09-06 DIAGNOSIS — Z79899 Other long term (current) drug therapy: Secondary | ICD-10-CM

## 2020-09-06 DIAGNOSIS — I8500 Esophageal varices without bleeding: Secondary | ICD-10-CM | POA: Diagnosis present

## 2020-09-06 DIAGNOSIS — Z8616 Personal history of COVID-19: Secondary | ICD-10-CM

## 2020-09-06 DIAGNOSIS — I480 Paroxysmal atrial fibrillation: Secondary | ICD-10-CM | POA: Diagnosis present

## 2020-09-06 DIAGNOSIS — D638 Anemia in other chronic diseases classified elsewhere: Secondary | ICD-10-CM | POA: Diagnosis not present

## 2020-09-06 DIAGNOSIS — Z515 Encounter for palliative care: Secondary | ICD-10-CM | POA: Diagnosis not present

## 2020-09-06 DIAGNOSIS — I5032 Chronic diastolic (congestive) heart failure: Secondary | ICD-10-CM | POA: Diagnosis present

## 2020-09-06 DIAGNOSIS — S52024A Nondisplaced fracture of olecranon process without intraarticular extension of right ulna, initial encounter for closed fracture: Secondary | ICD-10-CM | POA: Diagnosis present

## 2020-09-06 DIAGNOSIS — Z87891 Personal history of nicotine dependence: Secondary | ICD-10-CM

## 2020-09-06 DIAGNOSIS — E7220 Disorder of urea cycle metabolism, unspecified: Secondary | ICD-10-CM

## 2020-09-06 DIAGNOSIS — R531 Weakness: Secondary | ICD-10-CM | POA: Diagnosis not present

## 2020-09-06 LAB — CBC WITH DIFFERENTIAL/PLATELET
Abs Immature Granulocytes: 0.02 10*3/uL (ref 0.00–0.07)
Basophils Absolute: 0 10*3/uL (ref 0.0–0.1)
Basophils Relative: 0 %
Eosinophils Absolute: 0 10*3/uL (ref 0.0–0.5)
Eosinophils Relative: 0 %
HCT: 27.8 % — ABNORMAL LOW (ref 39.0–52.0)
Hemoglobin: 8.9 g/dL — ABNORMAL LOW (ref 13.0–17.0)
Immature Granulocytes: 0 %
Lymphocytes Relative: 14 %
Lymphs Abs: 0.7 10*3/uL (ref 0.7–4.0)
MCH: 29.7 pg (ref 26.0–34.0)
MCHC: 32 g/dL (ref 30.0–36.0)
MCV: 92.7 fL (ref 80.0–100.0)
Monocytes Absolute: 0.7 10*3/uL (ref 0.1–1.0)
Monocytes Relative: 13 %
Neutro Abs: 3.9 10*3/uL (ref 1.7–7.7)
Neutrophils Relative %: 73 %
Platelets: 60 10*3/uL — ABNORMAL LOW (ref 150–400)
RBC: 3 MIL/uL — ABNORMAL LOW (ref 4.22–5.81)
RDW: 17.2 % — ABNORMAL HIGH (ref 11.5–15.5)
WBC: 5.3 10*3/uL (ref 4.0–10.5)
nRBC: 0 % (ref 0.0–0.2)

## 2020-09-06 LAB — BRAIN NATRIURETIC PEPTIDE: B Natriuretic Peptide: 285 pg/mL — ABNORMAL HIGH (ref 0.0–100.0)

## 2020-09-06 LAB — RESP PANEL BY RT-PCR (FLU A&B, COVID) ARPGX2
Influenza A by PCR: NEGATIVE
Influenza B by PCR: NEGATIVE
SARS Coronavirus 2 by RT PCR: NEGATIVE

## 2020-09-06 LAB — LACTIC ACID, PLASMA
Lactic Acid, Venous: 2.3 mmol/L (ref 0.5–1.9)
Lactic Acid, Venous: 1.2 mmol/L (ref 0.5–1.9)

## 2020-09-06 LAB — COMPREHENSIVE METABOLIC PANEL
ALT: 48 U/L — ABNORMAL HIGH (ref 0–44)
AST: 50 U/L — ABNORMAL HIGH (ref 15–41)
Albumin: 2.3 g/dL — ABNORMAL LOW (ref 3.5–5.0)
Alkaline Phosphatase: 118 U/L (ref 38–126)
Anion gap: 9 (ref 5–15)
BUN: 61 mg/dL — ABNORMAL HIGH (ref 8–23)
CO2: 26 mmol/L (ref 22–32)
Calcium: 8.5 mg/dL — ABNORMAL LOW (ref 8.9–10.3)
Chloride: 106 mmol/L (ref 98–111)
Creatinine, Ser: 2.57 mg/dL — ABNORMAL HIGH (ref 0.61–1.24)
GFR, Estimated: 26 mL/min — ABNORMAL LOW (ref >60)
Glucose, Bld: 130 mg/dL — ABNORMAL HIGH (ref 70–99)
Potassium: 4.8 mmol/L (ref 3.5–5.1)
Sodium: 141 mmol/L (ref 135–145)
Total Bilirubin: 1.3 mg/dL — ABNORMAL HIGH (ref 0.3–1.2)
Total Protein: 5.4 g/dL — ABNORMAL LOW (ref 6.5–8.1)

## 2020-09-06 LAB — BLOOD GAS, VENOUS
Acid-Base Excess: 2.3 mmol/L — ABNORMAL HIGH (ref 0.0–2.0)
Bicarbonate: 26.1 mmol/L (ref 20.0–28.0)
O2 Saturation: 71.9 %
Patient temperature: 98.6
pCO2, Ven: 39.2 mmHg — ABNORMAL LOW (ref 44.0–60.0)
pH, Ven: 7.438 — ABNORMAL HIGH (ref 7.250–7.430)
pO2, Ven: 42.5 mmHg (ref 32.0–45.0)

## 2020-09-06 LAB — PROTIME-INR
INR: 1.6 — ABNORMAL HIGH (ref 0.8–1.2)
Prothrombin Time: 18.7 seconds — ABNORMAL HIGH (ref 11.4–15.2)

## 2020-09-06 LAB — GLUCOSE, CAPILLARY
Glucose-Capillary: 46 mg/dL — ABNORMAL LOW (ref 70–99)
Glucose-Capillary: 115 mg/dL — ABNORMAL HIGH (ref 70–99)

## 2020-09-06 LAB — LIPASE, BLOOD: Lipase: 39 U/L (ref 11–51)

## 2020-09-06 LAB — AMMONIA: Ammonia: 174 umol/L — ABNORMAL HIGH (ref 9–35)

## 2020-09-06 LAB — APTT: aPTT: 29 seconds (ref 24–36)

## 2020-09-06 MED ORDER — ALBUTEROL SULFATE HFA 108 (90 BASE) MCG/ACT IN AERS
2.0000 | INHALATION_SPRAY | Freq: Four times a day (QID) | RESPIRATORY_TRACT | Status: DC
  Administered 2020-09-07 (×2): 2 via RESPIRATORY_TRACT
  Filled 2020-09-06: qty 6.7

## 2020-09-06 MED ORDER — LACTULOSE 10 GM/15ML PO SOLN
60.0000 g | Freq: Three times a day (TID) | ORAL | Status: DC
  Administered 2020-09-07 (×3): 60 g via ORAL
  Filled 2020-09-06 (×5): qty 90

## 2020-09-06 MED ORDER — PANTOPRAZOLE SODIUM 40 MG PO TBEC
40.0000 mg | DELAYED_RELEASE_TABLET | Freq: Two times a day (BID) | ORAL | Status: DC
  Administered 2020-09-08 (×2): 40 mg via ORAL
  Filled 2020-09-06 (×4): qty 1

## 2020-09-06 MED ORDER — ONDANSETRON HCL 4 MG/2ML IJ SOLN: 4.0000 mg | Freq: Four times a day (QID) | INTRAMUSCULAR | Status: DC | PRN

## 2020-09-06 MED ORDER — LACTATED RINGERS IV BOLUS: 500.0000 mL | Freq: Once | INTRAVENOUS | Status: DC

## 2020-09-06 MED ORDER — TAMSULOSIN HCL 0.4 MG PO CAPS
0.4000 mg | ORAL_CAPSULE | Freq: Every day | ORAL | Status: DC
  Administered 2020-09-07 – 2020-09-08 (×2): 0.4 mg via ORAL
  Filled 2020-09-06 (×2): qty 1

## 2020-09-06 MED ORDER — ATORVASTATIN CALCIUM 10 MG PO TABS
20.0000 mg | ORAL_TABLET | Freq: Every day | ORAL | Status: DC
  Filled 2020-09-06: qty 2

## 2020-09-06 MED ORDER — LORAZEPAM 2 MG/ML IJ SOLN
1.0000 mg | Freq: Once | INTRAMUSCULAR | Status: AC | PRN
  Administered 2020-09-06: 1 mg via INTRAVENOUS
  Filled 2020-09-06: qty 1

## 2020-09-06 MED ORDER — LACTULOSE ENEMA
300.0000 mL | Freq: Once | ORAL | Status: AC
  Administered 2020-09-06: 300 mL via RECTAL
  Filled 2020-09-06: qty 300

## 2020-09-06 MED ORDER — LACTULOSE 10 GM/15ML PO SOLN: 30.0000 g | Freq: Once | ORAL | Status: DC

## 2020-09-06 MED ORDER — INSULIN ASPART 100 UNIT/ML ~~LOC~~ SOLN
0.0000 [IU] | Freq: Every day | SUBCUTANEOUS | Status: DC
  Administered 2020-09-07: 3 [IU] via SUBCUTANEOUS
  Filled 2020-09-06: qty 0.05

## 2020-09-06 MED ORDER — INSULIN ASPART 100 UNIT/ML ~~LOC~~ SOLN
0.0000 [IU] | Freq: Three times a day (TID) | SUBCUTANEOUS | Status: DC
  Administered 2020-09-07 (×2): 1 [IU] via SUBCUTANEOUS
  Administered 2020-09-08 (×2): 2 [IU] via SUBCUTANEOUS
  Filled 2020-09-06: qty 0.09

## 2020-09-06 MED ORDER — TORSEMIDE 100 MG PO TABS
100.0000 mg | ORAL_TABLET | Freq: Every day | ORAL | Status: DC
  Administered 2020-09-08: 100 mg via ORAL
  Filled 2020-09-06 (×2): qty 1

## 2020-09-06 MED ORDER — LORAZEPAM 2 MG/ML IJ SOLN
0.5000 mg | INTRAMUSCULAR | Status: DC | PRN
  Administered 2020-09-06 – 2020-09-08 (×3): 0.5 mg via INTRAVENOUS
  Filled 2020-09-06 (×3): qty 1

## 2020-09-06 MED ORDER — INSULIN ASPART PROT & ASPART (70-30 MIX) 100 UNIT/ML ~~LOC~~ SUSP
20.0000 [IU] | Freq: Two times a day (BID) | SUBCUTANEOUS | Status: DC
  Filled 2020-09-06: qty 10

## 2020-09-06 MED ORDER — GUAIFENESIN-DM 100-10 MG/5ML PO SYRP: 10.0000 mL | ORAL_SOLUTION | ORAL | Status: DC | PRN

## 2020-09-06 MED ORDER — ONDANSETRON HCL 4 MG PO TABS: 4.0000 mg | ORAL_TABLET | Freq: Four times a day (QID) | ORAL | Status: DC | PRN

## 2020-09-06 MED ORDER — DEXTROSE 50 % IV SOLN
25.0000 g | INTRAVENOUS | Status: AC
  Administered 2020-09-06: 25 g via INTRAVENOUS
  Filled 2020-09-06: qty 50

## 2020-09-06 MED ORDER — RIFAXIMIN 550 MG PO TABS
550.0000 mg | ORAL_TABLET | Freq: Two times a day (BID) | ORAL | Status: DC
  Administered 2020-09-07 – 2020-09-08 (×3): 550 mg via ORAL
  Filled 2020-09-06 (×3): qty 1

## 2020-09-06 MED ORDER — CARVEDILOL 6.25 MG PO TABS
6.2500 mg | ORAL_TABLET | Freq: Two times a day (BID) | ORAL | Status: DC
  Administered 2020-09-07 – 2020-09-08 (×3): 6.25 mg via ORAL
  Filled 2020-09-06 (×3): qty 1

## 2020-09-06 MED ORDER — FERROUS SULFATE 325 (65 FE) MG PO TABS
325.0000 mg | ORAL_TABLET | Freq: Every day | ORAL | Status: DC
  Administered 2020-09-08: 325 mg via ORAL
  Filled 2020-09-06 (×2): qty 1

## 2020-09-06 MED ORDER — INSULIN ASPART PROT & ASPART (70-30 MIX) 100 UNIT/ML PEN: 20.0000 [IU] | PEN_INJECTOR | Freq: Two times a day (BID) | SUBCUTANEOUS | Status: DC

## 2020-09-06 MED ORDER — HEPARIN SODIUM (PORCINE) 5000 UNIT/ML IJ SOLN: 5000.0000 [IU] | Freq: Three times a day (TID) | INTRAMUSCULAR | Status: DC

## 2020-09-06 MED ORDER — DEXAMETHASONE 4 MG PO TABS: 6.0000 mg | ORAL_TABLET | Freq: Every day | ORAL | Status: DC
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
## 2020-09-06 NOTE — ED Notes (Signed)
Pt brief changed. Pt dry and clean.
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
## 2020-09-06 NOTE — ED Triage Notes (Signed)
Pt BIB EMS. Pt here due to altered mental status. Pt has high levels of ammonia. Pt is coming from [facility] facility. Hx of a-fib.    BP-112/86 O2-94% CBG-188

## 2020-09-06 NOTE — ED Notes (Signed)
Pt refusing to allow staff to change his brief for a clean one. Pt brief wet. Pt cleaned as best as staff could with pt pushing staff away from him. Dr. Ron Parker aware.
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
## 2020-09-06 NOTE — ED Notes (Signed)
Pt refusing to allow staff to perform pericare.
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
## 2020-09-06 NOTE — ED Notes (Signed)
Pt refusing to allow staff to perform pericare, pt wet and needing to be cleaned. Pt belligerent and yelling at staff. Pt shaking his fist in staff's face, screaming "get away from me".

## 2020-09-06 NOTE — ED Notes (Signed)
Condom catheter in place

## 2020-09-06 NOTE — ED Provider Notes (Signed)
[facility] DEPT Provider Note   CSN: [id number] Arrival date & time: 09/06/20  1425     History Chief Complaint  Patient presents with  . Altered Mental Status    Corey West is a 69 y.o. male.   Altered Mental Status Presenting symptoms: behavior changes   Severity:  Moderate Episode history:  Single Timing:  Constant Progression:  Unchanged Chronicity:  New Context comment:  Hx of hepatic encephalopathy      Past Medical History:  Diagnosis Date  . A-fib ([place])   . Anemia   . Anxiety    controlled;   . Arthritis   . AVM (arteriovenous malformation) of stomach, acquired with hemorrhage   . Barrett's esophagus   . CHF (congestive heart failure) ([place])   . Chronic kidney disease    renal infufficiency  . Cirrhosis ([place])   . COPD (chronic obstructive pulmonary disease) ([place])   . Depression    controlled;   Marland Kitchen Diabetes mellitus without complication ([place])    not controlled, taking insulin but sugar continues to run high;   Marland Kitchen Dysrhythmia   . Edema   . Esophageal varices ([place])   . GAVE (gastric antral vascular ectasia)   . GERD (gastroesophageal reflux disease)   . Hepatic encephalopathy ([place])   . History of hiatal hernia   . Hyperlipidemia   . Hypertension    controlled well;   . IPMN (intraductal papillary mucinous neoplasm)   . Liver cirrhosis ([place])   . Nephropathy, diabetic ([place])   . Obesity   . Pancytopenia ([place])   . Polyp, stomach    with chronic blood loss  . Portal hypertensive gastropathy ([place])   . Sleep apnea    does not wear a cpap, Medicare would not pay for it   . Venous stasis dermatitis of both lower extremities   . Venous stasis of both lower extremities    with cellulitis    Patient Active Problem List   Diagnosis Date Noted  . Acute metabolic encephalopathy 03/47/4259  . Pneumonia due to COVID-19 virus 08/14/2020  . GI bleed 07/26/2020  . Acute on chronic anemia 07/25/2020  . Generalized weakness  07/25/2020  . SIRS (systemic inflammatory response syndrome) ([place]) 07/07/2020  . Anemia   . Hypothermia   . Anemia of chronic disease 06/07/2020  . Prolonged QT interval 06/07/2020  . Hypoglycemia 06/07/2020  . Ascites 06/07/2020  . Portal hypertensive gastropathy ([place])   . Symptomatic anemia 05/19/2020  . Acute blood loss anemia 05/19/2020  . Acute GI bleeding 05/19/2020  . Bacteremia due to Klebsiella pneumoniae 04/26/2020  . Palliative care by specialist   . DNR (do not resuscitate) discussion   . COPD with chronic bronchitis ([place]) 04/23/2020  . Paroxysmal atrial fibrillation ([place]) 04/20/2020  . Cellulitis of left leg 02/23/2020  . Contusion of ribs, left, initial encounter 02/23/2020  . Acute hypoxemic respiratory failure ([place]) 02/04/2020  . Volume overload 01/28/2020  . Hyperlipidemia associated with type 2 diabetes mellitus ([place]) 01/28/2020  . Liver cirrhosis ([place])   . Anxiety   . SOB (shortness of breath)   . Elevated troponin   . Chronic kidney disease, stage 4 (severe) ([place])   . Unstageable pressure ulcer of sacral region ([place]) 11/28/2019  . Chronic diastolic CHF (congestive heart failure) ([place]) 11/27/2019  . Type 2 diabetes mellitus with stage 3 chronic kidney disease ([place]) 11/27/2019  . Cellulitis of left lower extremity 11/27/2019  . Cellulitis of lower extremity 11/27/2019  .  Cyst of pancreas 05/18/2019  . HCAP (healthcare-associated pneumonia) 02/03/2019  . Aphasia 01/14/2019  . Acute on chronic renal failure ([place]) 01/14/2019  . Malnutrition of moderate degree 12/03/2018  . C. difficile diarrhea 12/02/2018  . Ankle fracture, left, closed, initial encounter 10/15/2018  . Ankle fracture, right, closed, initial encounter 10/15/2018  . Ankle fracture 10/15/2018  . Pressure injury of skin 07/19/2018  . UTI (urinary tract infection) 07/18/2018  . Acute kidney injury superimposed on CKD ([place]) 01/02/2018  . 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 Goals  of care, counseling/discussion   . Palliative care encounter   . Acute hepatic encephalopathy 12/16/2017  . Hepatic encephalopathy ([place]) 10/18/2017  . Hypokalemia 10/09/2017  . Hyperglycemia 10/09/2017  . Sepsis ([place]) 06/12/2017  . Type 2 diabetes mellitus with diabetic nephropathy, with long-term current use of insulin ([place]) 05/29/2017  . Long term (current) use of insulin ([place]) 05/29/2017  . Cellulitis in diabetic foot ([place]) 02/21/2017  . Cellulitis 02/21/2017  . Upper GI bleed/melena 07/20/2015  . Hematemesis 02/08/2015  . Cirrhosis ([place]) 02/08/2015  . Esophageal varices ([place]) 02/08/2015  . Gastroesophageal reflux disease 02/08/2015  . Hyperlipidemia 02/08/2015  . Chronic venous stasis dermatitis of both lower extremities 10/06/2014  . Cutaneous abscess of limb, unspecified 09/17/2014  . Edema 05/25/2014  . Hypertension 05/25/2014  . Obesity, Class III, BMI 40-49.9 (morbid obesity) ([place]) 05/25/2014  . Sleep apnea 05/25/2014  . Barrett's esophagus 05/25/2014  . Pancytopenia ([place]) 05/25/2014  . History of esophageal varices 05/25/2014  . Iron deficiency anemia 12/04/2013  . Thrombocytopenia ([place]) 12/04/2013  . Splenomegaly 11/04/2013    Past Surgical History:  Procedure Laterality Date  . ESOPHAGOGASTRODUODENOSCOPY N/A 02/09/2015   Procedure: ESOPHAGOGASTRODUODENOSCOPY (EGD);  Surgeon: Manya Silvas, MD;  Location: [facility] ENDOSCOPY;  Service: Endoscopy;  Laterality: N/A;  . ESOPHAGOGASTRODUODENOSCOPY N/A 07/22/2015   Procedure: ESOPHAGOGASTRODUODENOSCOPY (EGD);  Surgeon: Manya Silvas, MD;  Location: [facility] ENDOSCOPY;  Service: Endoscopy;  Laterality: N/A;  . ESOPHAGOGASTRODUODENOSCOPY N/A 06/26/2017   Procedure: ESOPHAGOGASTRODUODENOSCOPY (EGD);  Surgeon: Manya Silvas, MD;  Location: [facility] ENDOSCOPY;  Service: Endoscopy;  Laterality: N/A;  . ESOPHAGOGASTRODUODENOSCOPY N/A 06/27/2017   Procedure: ESOPHAGOGASTRODUODENOSCOPY (EGD);  Surgeon: Manya Silvas, MD;  Location:  [facility] ENDOSCOPY;  Service: Endoscopy;  Laterality: N/A;  . ESOPHAGOGASTRODUODENOSCOPY N/A 06/28/2017   Procedure: ESOPHAGOGASTRODUODENOSCOPY (EGD);  Surgeon: Manya Silvas, MD;  Location: [facility] ENDOSCOPY;  Service: Endoscopy;  Laterality: N/A;  . ESOPHAGOGASTRODUODENOSCOPY Bilateral 10/11/2017   Procedure: ESOPHAGOGASTRODUODENOSCOPY (EGD);  Surgeon: Manya Silvas, MD;  Location: [facility] ENDOSCOPY;  Service: Endoscopy;  Laterality: Bilateral;  . ESOPHAGOGASTRODUODENOSCOPY N/A 12/04/2017   Procedure: ESOPHAGOGASTRODUODENOSCOPY (EGD);  Surgeon: Manya Silvas, MD;  Location: [facility] ENDOSCOPY;  Service: Endoscopy;  Laterality: N/A;  . ESOPHAGOGASTRODUODENOSCOPY N/A 03/06/2019   Procedure: ESOPHAGOGASTRODUODENOSCOPY (EGD);  Surgeon: Toledo, Benay Pike, MD;  Location: [facility] ENDOSCOPY;  Service: Gastroenterology;  Laterality: N/A;  . ESOPHAGOGASTRODUODENOSCOPY (EGD) WITH PROPOFOL N/A 07/20/2015   Procedure: ESOPHAGOGASTRODUODENOSCOPY (EGD) WITH PROPOFOL;  Surgeon: Manya Silvas, MD;  Location: [facility] ENDOSCOPY;  Service: Endoscopy;  Laterality: N/A;  . ESOPHAGOGASTRODUODENOSCOPY (EGD) WITH PROPOFOL N/A 09/16/2015   Procedure: ESOPHAGOGASTRODUODENOSCOPY (EGD) WITH PROPOFOL;  Surgeon: Manya Silvas, MD;  Location: [facility] ENDOSCOPY;  Service: Endoscopy;  Laterality: N/A;  . ESOPHAGOGASTRODUODENOSCOPY (EGD) WITH PROPOFOL N/A 03/16/2016   Procedure: ESOPHAGOGASTRODUODENOSCOPY (EGD) WITH PROPOFOL;  Surgeon: Manya Silvas, MD;  Location: [facility] ENDOSCOPY;  Service: Endoscopy;  Laterality: N/A;  . ESOPHAGOGASTRODUODENOSCOPY (EGD) WITH PROPOFOL N/A 09/14/2016   Procedure: ESOPHAGOGASTRODUODENOSCOPY (EGD)  WITH PROPOFOL;  Surgeon: Manya Silvas, MD;  Location: [facility] ENDOSCOPY;  Service: Endoscopy;  Laterality: N/A;  . ESOPHAGOGASTRODUODENOSCOPY (EGD) WITH PROPOFOL N/A 11/05/2016   Procedure: ESOPHAGOGASTRODUODENOSCOPY (EGD) WITH PROPOFOL;  Surgeon: Manya Silvas, MD;  Location: [facility] ENDOSCOPY;  Service: Endoscopy;   Laterality: N/A;  . ESOPHAGOGASTRODUODENOSCOPY (EGD) WITH PROPOFOL N/A 02/06/2017   Procedure: ESOPHAGOGASTRODUODENOSCOPY (EGD) WITH PROPOFOL;  Surgeon: Manya Silvas, MD;  Location: [facility] ENDOSCOPY;  Service: Endoscopy;  Laterality: N/A;  . ESOPHAGOGASTRODUODENOSCOPY (EGD) WITH PROPOFOL N/A 04/24/2017   Procedure: ESOPHAGOGASTRODUODENOSCOPY (EGD) WITH PROPOFOL;  Surgeon: Manya Silvas, MD;  Location: [facility] ENDOSCOPY;  Service: Endoscopy;  Laterality: N/A;  . ESOPHAGOGASTRODUODENOSCOPY (EGD) WITH PROPOFOL N/A 07/31/2017   Procedure: ESOPHAGOGASTRODUODENOSCOPY (EGD) WITH PROPOFOL;  Surgeon: Manya Silvas, MD;  Location: [facility] ENDOSCOPY;  Service: Endoscopy;  Laterality: N/A;  . ESOPHAGOGASTRODUODENOSCOPY (EGD) WITH PROPOFOL N/A 08/08/2018   Procedure: ESOPHAGOGASTRODUODENOSCOPY (EGD) WITH PROPOFOL;  Surgeon: Manya Silvas, MD;  Location: [facility] ENDOSCOPY;  Service: Endoscopy;  Laterality: N/A;  . ESOPHAGOGASTRODUODENOSCOPY (EGD) WITH PROPOFOL N/A 05/21/2020   Procedure: ESOPHAGOGASTRODUODENOSCOPY (EGD) WITH PROPOFOL;  Surgeon: [name] Manifold, MD;  Location: [facility] ENDOSCOPY;  Service: Endoscopy;  Laterality: N/A;  . ESOPHAGOGASTRODUODENOSCOPY (EGD) WITH PROPOFOL N/A 07/18/2020   Procedure: ESOPHAGOGASTRODUODENOSCOPY (EGD) WITH PROPOFOL;  Surgeon: Toledo, Benay Pike, MD;  Location: [facility] ENDOSCOPY;  Service: Gastroenterology;  Laterality: N/A;  . ESOPHAGOGASTRODUODENOSCOPY (EGD) WITH PROPOFOL N/A 07/28/2020   Procedure: ESOPHAGOGASTRODUODENOSCOPY (EGD) WITH PROPOFOL;  Surgeon: Lesly Rubenstein, MD;  Location: [facility] ENDOSCOPY;  Service: Endoscopy;  Laterality: N/A;  . GIVENS CAPSULE STUDY N/A 12/05/2017   Procedure: GIVENS CAPSULE STUDY;  Surgeon: Manya Silvas, MD;  Location: [facility] ENDOSCOPY;  Service: Endoscopy;  Laterality: N/A;  . TONSILLECTOMY    . TONSILLECTOMY AND ADENOIDECTOMY    . ULNAR NERVE TRANSPOSITION    . UVULOPALATOPHARYNGOPLASTY         Family History  Problem  Relation Age of Onset  . Diabetes Other   . Transient ischemic attack Father   . CAD Father     Social History   Tobacco Use  . Smoking status: Former Smoker    Years: 20.00    Types: Cigars, Cigarettes  . Smokeless tobacco: Never Used  Vaping Use  . Vaping Use: Never used  Substance Use Topics  . Alcohol use: No    Comment: stopped 15 years ago  . Drug use: No    Home Medications Prior to Admission medications   Medication Sig Start Date End Date Taking? Authorizing Provider  albuterol (VENTOLIN HFA) 108 (90 Base) MCG/ACT inhaler Inhale 2 puffs into the lungs every 6 (six) hours. 08/18/20   Fritzi Mandes, MD  atorvastatin (LIPITOR) 20 MG tablet Take 20 mg by mouth daily at 6 PM.  03/23/20   [provider]  carvedilol (COREG) 3.125 MG tablet Take 1 tablet (3.125 mg total) by mouth 2 (two) times daily with a meal. 07/30/20   Florencia Reasons, MD  dexamethasone (DECADRON) 6 MG tablet Take 1 tablet (6 mg total) by mouth daily. 08/18/20   Fritzi Mandes, MD  ferrous sulfate 325 (65 FE) MG tablet Take 1 tablet (325 mg total) by mouth daily. This medication can cause black stools 06/11/20   Wyvonnia Dusky, MD  guaiFENesin-dextromethorphan [facility] DM) 100-10 MG/5ML syrup Take 10 mLs by mouth every 4 (four) hours as needed for cough. 08/18/20   Fritzi Mandes, MD  lactulose (CHRONULAC) 10 GM/15ML solution Take 90 mLs (60 g total) by mouth  daily. 05/04/20   Nicole Kindred A, DO  NOVOLOG MIX 70/30 FLEXPEN (70-30) 100 UNIT/ML FlexPen Inject 0.6 mLs (60 Units total) into the skin 2 (two) times daily. 08/18/20   Fritzi Mandes, MD  pantoprazole (PROTONIX) 40 MG tablet Take 1 tablet (40 mg total) by mouth 2 (two) times daily. 08/10/20 08/10/21  Alma Friendly, MD  potassium chloride SA (KLOR-CON) 20 MEQ tablet Take 20 mEq by mouth 2 (two) times daily. 07/06/20   [provider]  rifaximin (XIFAXAN) 550 MG TABS tablet Take 1 tablet (550 mg total) by mouth 2 (two) times daily. 02/12/20    Loletha Grayer, MD  sodium chloride (OCEAN) 0.65 % SOLN nasal spray Place 1 spray into both nostrils as needed for congestion (nose irritation). Patient taking differently: Place 1 spray into both nostrils 2 (two) times daily as needed for congestion (nose irritation).  02/12/20   Loletha Grayer, MD  tamsulosin (FLOMAX) 0.4 MG CAPS capsule Take 1 capsule (0.4 mg total) by mouth daily after supper. 05/03/20   Ezekiel Slocumb, DO  torsemide (DEMADEX) 100 MG tablet Take 1 tablet (100 mg total) by mouth in the morning. 08/10/20   Alma Friendly, MD  torsemide (DEMADEX) 20 MG tablet Take 2 tablets (40 mg total) by mouth every evening. 08/10/20   Alma Friendly, MD    Allergies    Patient has no known allergies.  Review of Systems   Review of Systems  Unable to perform ROS: Mental status change    Physical Exam Updated Vital Signs BP 134/84   Pulse (!) 112   Temp 97.9 F (36.6 C) (Axillary)   Resp (!) 32   Ht 5\' 9"  (1.753 m)   Wt (!) 140 kg   SpO2 100%   BMI 45.58 kg/m   Physical Exam Vitals and nursing note reviewed.  Constitutional:      General: He is not in acute distress.    Appearance: Normal appearance.  HENT:     Head: Normocephalic and atraumatic.     Nose: No rhinorrhea.  Eyes:     General:        Right eye: No discharge.        Left eye: No discharge.     Conjunctiva/sclera: Conjunctivae normal.  Cardiovascular:     Rate and Rhythm: Tachycardia present. Rhythm irregular.  Pulmonary:     Effort: Pulmonary effort is normal.     Breath sounds: No stridor.  Abdominal:     General: Abdomen is flat. There is no distension.     Palpations: Abdomen is soft.  Musculoskeletal:        General: Signs of injury present. No deformity.     Comments: Abrasion over the lateral epicondyle, patient uncooperative with range of motion or palpation  Skin:    General: Skin is warm and dry.  Neurological:     Mental Status: He is alert.     Comments: Patient seen  moving his extremities equally, no facial droop no slurred speech but not cooperative with remainder of physical exam  Psychiatric:        Attention and Perception: He is inattentive.        Mood and Affect: Affect is angry.        Speech: Speech is delayed.        Behavior: Behavior is uncooperative, agitated and aggressive.     ED Results / Procedures / Treatments   Labs (all labs ordered are listed, but only abnormal  results are displayed) Labs Reviewed  LACTIC ACID, PLASMA - Abnormal; Notable for the following components:      Result Value   Lactic Acid, Venous 2.3 (*)    All other components within normal limits  COMPREHENSIVE METABOLIC PANEL - Abnormal; Notable for the following components:   Glucose, Bld 130 (*)    BUN 61 (*)    Creatinine, Ser 2.57 (*)    Calcium 8.5 (*)    Total Protein 5.4 (*)    Albumin 2.3 (*)    AST 50 (*)    ALT 48 (*)    Total Bilirubin 1.3 (*)    GFR, Estimated 26 (*)    All other components within normal limits  CBC WITH DIFFERENTIAL/PLATELET - Abnormal; Notable for the following components:   RBC 3.00 (*)    Hemoglobin 8.9 (*)    HCT 27.8 (*)    RDW 17.2 (*)    Platelets 60 (*)    All other components within normal limits  PROTIME-INR - Abnormal; Notable for the following components:   Prothrombin Time 18.7 (*)    INR 1.6 (*)    All other components within normal limits  BRAIN NATRIURETIC PEPTIDE - Abnormal; Notable for the following components:   B Natriuretic Peptide 285.0 (*)    All other components within normal limits  BLOOD GAS, VENOUS - Abnormal; Notable for the following components:   pH, Ven 7.438 (*)    pCO2, Ven 39.2 (*)    Acid-Base Excess 2.3 (*)    All other components within normal limits  AMMONIA - Abnormal; Notable for the following components:   Ammonia 174 (*)    All other components within normal limits  RESP PANEL BY RT-PCR (FLU A&B, COVID) ARPGX2  CULTURE, BLOOD (SINGLE)  URINE CULTURE  APTT  LIPASE, BLOOD   LACTIC ACID, PLASMA  URINALYSIS, ROUTINE W REFLEX MICROSCOPIC    EKG EKG Interpretation  Date/Time:  Tuesday September 06 2020 14:48:46 EST Ventricular Rate:  113 PR Interval:    QRS Duration: 114 QT Interval:  385 QTC Calculation: 528 R Axis:   68 Text Interpretation: Atrial fibrillation Ventricular premature complex Borderline intraventricular conduction delay Nonspecific T abnormalities, inferior leads Prolonged QT interval Confirmed by Dewaine Conger [phone]) on 09/06/2020 3:49:17 PM   Radiology DG Elbow Complete Right  Result Date: 09/06/2020 CLINICAL DATA:  Right elbow laceration. EXAM: RIGHT ELBOW - COMPLETE 3+ VIEW COMPARISON:  None. FINDINGS: There is a thin curvilinear lucency extending across a large bony spur along the right olecranon process. There is no evidence of dislocation. A mild amount of adjacent dorsal soft tissue swelling is noted. IMPRESSION: Findings which may represent a nondisplaced fracture involving a bony spur of the right olecranon process. Correlation with physical exam is recommended to determine the presence of point tenderness within this region. Electronically Signed   By: Virgina Norfolk M.D.   On: 09/06/2020 16:20   CT Head Wo Contrast  Result Date: 09/06/2020 CLINICAL DATA:  Altered mental status. EXAM: CT HEAD WITHOUT CONTRAST TECHNIQUE: Contiguous axial images were obtained from the base of the skull through the vertex without intravenous contrast. COMPARISON:  August 14, 2020. FINDINGS: Brain: No evidence of acute large vascular territory infarction, hemorrhage, hydrocephalus, extra-axial collection or mass lesion/mass effect. Mild white matter hypodensities, likely related to chronic microvascular ischemic disease. Vascular: Mild calcific atherosclerosis. Skull: Normal. Negative for fracture or focal lesion. Sinuses/Orbits: Mild ethmoid air cell mucosal thickening. Otherwise, visualized sinuses are clear. Unremarkable orbits. Other:  No mastoid  effusions. IMPRESSION: No evidence of acute intracranial abnormality. Electronically Signed   By: Margaretha Sheffield MD   On: 09/06/2020 16:58   DG Chest Port 1 View  Result Date: 09/06/2020 CLINICAL DATA:  Altered mental status EXAM: PORTABLE CHEST 1 VIEW COMPARISON:  08/14/2020 FINDINGS: Low lung volumes. No new consolidation or edema. Improved aeration at left lung base. No pleural effusion. Stable mild cardiomegaly. IMPRESSION: No acute process in the chest. Electronically Signed   By: Macy Mis M.D.   On: 09/06/2020 16:18    Procedures .Critical Care Performed by: Breck Coons, MD Authorized by: Breck Coons, MD   Critical care provider statement:    Critical care time (minutes):  45   Critical care was necessary to treat or prevent imminent or life-threatening deterioration of the following conditions:  CNS failure or compromise   Critical care was time spent personally by me on the following activities:  Discussions with consultants, evaluation of patient's response to treatment, examination of patient, ordering and performing treatments and interventions, ordering and review of laboratory studies, ordering and review of radiographic studies, pulse oximetry, re-evaluation of patient's condition, obtaining history from patient or surrogate, review of old charts, blood draw for specimens and development of treatment plan with patient or surrogate   (including critical care time)  Medications Ordered in ED Medications  lactated ringers bolus 500 mL (has no administration in time range)  lactulose (CHRONULAC) 10 GM/15ML solution 30 g (has no administration in time range)  LORazepam (ATIVAN) injection 1 mg (1 mg Intravenous Given 09/06/20 1526)    ED Course  I have reviewed the triage vital signs and the nursing notes.  Pertinent labs & imaging results that were available during my care of the patient were reviewed by me and considered in my medical decision making (see chart for  details).    MDM Rules/Calculators/A&P                          History of hepatic encephalopathy, family believes the facility has missed doses of lactulose.  A fall today.  He will get scans of his head he will get x-ray imaging he will get metabolic encephalopathy work-up.  Patient's EKG shows atrial fibrillation at a rate in the one teens.  He is hemodynamically stable.  Ativan was given due to agitation.  Laboratory studies thus far show lactic acidosis and hyperammonemia.  Lactulose given.  Minimal fluid bolus given no source of infection at this time.  X-rays reviewed by myself radiology showing electron fracture.  I consulted orthopedics they recommended a heavily padded posterior slab splint outpatient follow-up.  CT scan of the head was reviewed by radiology myself shows no acute intracranial abnormality.  Chest x-ray shows no acute cardiopulmonary pathology.  Lactulose given.  Hospitalist consulted for admission.  CRITICAL CARE Performed by: Breck Coons   Total critical care time: 45 minutes  Critical care time was exclusive of separately billable procedures and treating other patients.  Critical care was necessary to treat or prevent imminent or life-threatening deterioration.  Critical care was time spent personally by me on the following activities: development of treatment plan with patient and/or surrogate as well as nursing, discussions with consultants, evaluation of patient's response to treatment, examination of patient, obtaining history from patient or surrogate, ordering and performing treatments and interventions, ordering and review of laboratory studies, ordering and review of radiographic studies, pulse oximetry and re-evaluation of  patient's condition.   Final Clinical Impression(s) / ED Diagnoses Final diagnoses:  Hepatic encephalopathy (HCC)  Hyperammonemia (HCC)  Lactic acid acidosis  Altered mental status, unspecified altered mental status type  Closed  fracture of olecranon process of right ulna, initial encounter    Rx / DC Orders ED Discharge Orders    None       Breck Coons, MD 09/06/20 1759

## 2020-09-06 NOTE — ED Notes (Signed)
Skin tear noted to pts right elbow

## 2020-09-06 NOTE — Progress Notes (Signed)
Orthopedic Tech Progress Note Patient Details:  Corey West [birth year] [id number]  Ortho Devices Type of Ortho Device: Ace wrap, Post (long arm) splint Ortho Device/Splint Location: applied long arm splint and sling to RUE Ortho Device/Splint Interventions: Ordered, Application   Post Interventions Patient Tolerated: Well Instructions Provided: Care of device   Braulio Bosch 09/06/2020, 5:28 PM

## 2020-09-07 ENCOUNTER — Other Ambulatory Visit: Payer: Self-pay

## 2020-09-07 DIAGNOSIS — E119 Type 2 diabetes mellitus without complications: Secondary | ICD-10-CM

## 2020-09-07 DIAGNOSIS — K7290 Hepatic failure, unspecified without coma: Secondary | ICD-10-CM | POA: Diagnosis not present

## 2020-09-07 DIAGNOSIS — I480 Paroxysmal atrial fibrillation: Secondary | ICD-10-CM

## 2020-09-07 DIAGNOSIS — Z794 Long term (current) use of insulin: Secondary | ICD-10-CM

## 2020-09-07 DIAGNOSIS — D696 Thrombocytopenia, unspecified: Secondary | ICD-10-CM | POA: Diagnosis not present

## 2020-09-07 DIAGNOSIS — D638 Anemia in other chronic diseases classified elsewhere: Secondary | ICD-10-CM

## 2020-09-07 DIAGNOSIS — R627 Adult failure to thrive: Secondary | ICD-10-CM

## 2020-09-07 LAB — COMPREHENSIVE METABOLIC PANEL
ALT: 41 U/L (ref 0–44)
AST: 36 U/L (ref 15–41)
Albumin: 2.2 g/dL — ABNORMAL LOW (ref 3.5–5.0)
Alkaline Phosphatase: 106 U/L (ref 38–126)
Anion gap: 10 (ref 5–15)
BUN: 58 mg/dL — ABNORMAL HIGH (ref 8–23)
CO2: 26 mmol/L (ref 22–32)
Calcium: 8.4 mg/dL — ABNORMAL LOW (ref 8.9–10.3)
Chloride: 107 mmol/L (ref 98–111)
Creatinine, Ser: 2.44 mg/dL — ABNORMAL HIGH (ref 0.61–1.24)
GFR, Estimated: 28 mL/min — ABNORMAL LOW (ref >60)
Glucose, Bld: 100 mg/dL — ABNORMAL HIGH (ref 70–99)
Potassium: 3.7 mmol/L (ref 3.5–5.1)
Sodium: 143 mmol/L (ref 135–145)
Total Bilirubin: 1.4 mg/dL — ABNORMAL HIGH (ref 0.3–1.2)
Total Protein: 5.3 g/dL — ABNORMAL LOW (ref 6.5–8.1)

## 2020-09-07 LAB — CBC
HCT: 28.7 % — ABNORMAL LOW (ref 39.0–52.0)
Hemoglobin: 8.7 g/dL — ABNORMAL LOW (ref 13.0–17.0)
MCH: 29.5 pg (ref 26.0–34.0)
MCHC: 30.3 g/dL (ref 30.0–36.0)
MCV: 97.3 fL (ref 80.0–100.0)
Platelets: 51 10*3/uL — ABNORMAL LOW (ref 150–400)
RBC: 2.95 MIL/uL — ABNORMAL LOW (ref 4.22–5.81)
RDW: 17.3 % — ABNORMAL HIGH (ref 11.5–15.5)
WBC: 3.4 10*3/uL — ABNORMAL LOW (ref 4.0–10.5)
nRBC: 0 % (ref 0.0–0.2)

## 2020-09-07 LAB — PROTIME-INR
INR: 1.4 — ABNORMAL HIGH (ref 0.8–1.2)
Prothrombin Time: 17 seconds — ABNORMAL HIGH (ref 11.4–15.2)

## 2020-09-07 LAB — GLUCOSE, CAPILLARY
Glucose-Capillary: 92 mg/dL (ref 70–99)
Glucose-Capillary: 125 mg/dL — ABNORMAL HIGH (ref 70–99)
Glucose-Capillary: 134 mg/dL — ABNORMAL HIGH (ref 70–99)
Glucose-Capillary: 274 mg/dL — ABNORMAL HIGH (ref 70–99)

## 2020-09-07 MED ORDER — INSULIN ASPART PROT & ASPART (70-30 MIX) 100 UNIT/ML ~~LOC~~ SUSP
6.0000 [IU] | Freq: Two times a day (BID) | SUBCUTANEOUS | Status: DC
  Filled 2020-09-07: qty 10

## 2020-09-07 MED ORDER — ALBUTEROL SULFATE HFA 108 (90 BASE) MCG/ACT IN AERS
2.0000 | INHALATION_SPRAY | Freq: Three times a day (TID) | RESPIRATORY_TRACT | Status: DC
  Administered 2020-09-08 (×2): 2 via RESPIRATORY_TRACT

## 2020-09-07 MED ORDER — ALBUTEROL SULFATE (2.5 MG/3ML) 0.083% IN NEBU: 2.5000 mg | INHALATION_SOLUTION | RESPIRATORY_TRACT | Status: DC | PRN

## 2020-09-07 NOTE — TOC Progression Note (Addendum)
Transition of Care [facility]) - Progression Note    Patient Details  Name: Corey West MRN: [id number] Date of Birth: [birth year]  Transition of [facility] CM/SW Contact  Purcell Mouton, RN Phone Number: 09/07/2020, 3:55 PM  Clinical Narrative:     Pt from [facility].  Spoke with pt's niece [name], who states that she was not sure that pt would return to [facility] related to the hold fee on bed. TOC will continue to follow for discharge.   Expected Discharge Plan: Skilled Nursing Facility Barriers to Discharge: No Barriers Identified  Expected Discharge Plan and Services Expected Discharge Plan: [place]   Discharge Planning Services: CM Consult   Living arrangements for the past 2 months: [place]                                       Social Determinants of Health (SDOH) Interventions    Readmission Risk Interventions Readmission Risk Prevention Plan 08/16/2020 07/27/2020 06/09/2020  Transportation Screening Complete Complete Complete  PCP or Specialist Appt within 3-5 Days - - -  HRI or [place] Work Consult for [place] Planning/Counseling - - -  [place] - - -  Medication Review Press photographer) Complete Complete Complete  PCP or Specialist appointment within 3-5 days of discharge Complete Complete Complete  HRI or Home Care Consult Complete Complete Complete  SW Recovery Care/Counseling Consult Complete Complete Complete  SW Consult Not Complete Comments - - -  Palliative Care Screening Complete Not Applicable Not Applicable  Skilled Nursing Facility Complete Complete Not Applicable  Some recent data might be hidden

## 2020-09-07 NOTE — Progress Notes (Signed)
Orthopedic Tech Progress Note Patient Details:  CONARD ALVIRA [birth year] [id number] reapplied sugartong splint. Ortho Devices Type of Ortho Device: Cotton web roll Ortho Device/Splint Location: applied long arm splint and sling to RUE Ortho Device/Splint Interventions: Application   Post Interventions Patient Tolerated: Well Instructions Provided: Care of device   Braulio Bosch 09/07/2020, 8:13 PM

## 2020-09-07 NOTE — Plan of Care (Signed)

## 2020-09-07 NOTE — Progress Notes (Addendum)
PROGRESS NOTE    Corey MOHON  West:[id number] DOB: August 20, 1951 DOA: 09/06/2020 PCP: Leonel Ramsay, MD    Chief Complaint  Patient presents with  . Altered Mental Status    Brief Narrative:  Corey West is a 69 y.o. male with Past medical history of cirrhosis of the liver, portal hypertensive gastropathy, hepatic encephalopathy, chronic diastolic CHF, type II DM, CKD 3B, HTN, chronic anemia, anxiety, BPH.  Presents with complaints of confusion and agitation, found to have hepatic encephalopathy and nondisplaced right olecranon fracture He is from [facility] facility  Subjective:   Remain confused, not following command consistently  Assessment & Plan:   Active Problems:   Hepatic encephalopathy (HCC)   acute hepatic encephalopathy with h/o Cirrhosisof liver ,  portal hypertension esophageal varices, and chronic thrombocytopenia -Received lactulose enema on admission -Appear more awake although still confused, will continue oral lactulose, rifaximin -Advance diet as tolerated  Full in the facility likely secondary to encephalopathy lead to nondisplaced right olecranon fracture. On-call orthopedic was consulted. Nonoperative conservative measures recommended. Currently wearing the sling.  Lactic acidosis present on admission, normalized  Elevated LFT Improved   CKDIV Creatinine appears at baseline Renal dosing meds  Anemia of chronic disease Hemoglobin 8.7  Paroxysmal A. Fib, Essential hypertension, Chronic HFpEF Not on any anticoagulation given severe thrombocytopenia Echocardiogram done on July 2021 withLVEF 50 to 55%, no evidence of focal wall motion abnormalities, grade 2 diastolic dysfunction. -Currently in A. 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 Controlled, A1c 6.7 A.m. blood glucose 100 decrease meal coverage  insulin from 20units bid with meal to 6units bid with meal due to confusion and poor oral intake, continue SSI  BPH, continue Flomax  Recent COVID pneumonia, was tested positive on November 14, treated with remdesivir and Decadron Repeat Covid testing this admission negative  Body mass index is 34.32 kg/m.   FTT, frequent hospitalizations, deceased 28 hospitalizations this year  palliative care consulted for goals of care discussion    DVT prophylaxis: Place and maintain sequential compression device Start: 09/07/20 0535   Code Status:full Family Communication: patient Disposition:   Status is: Inpatient  Dispo: The patient is from: SNF              Anticipated d/c is to: SNF with palliative care              Anticipated d/c date is: >3 days              Patient currently not ready to discharge  Consultants:   Palliative care  Procedures:   None  Antimicrobials:   None     Objective: Vitals:   09/06/20 2153 09/06/20 2159 09/07/20 0636 09/07/20 0933  BP:  129/71 132/62 97/65  Pulse:  (!) 108 (!) 42 62  Resp:  18 20 14   Temp:   97.8 F (36.6 C) 98.6 F (37 C)  TempSrc:    Axillary  SpO2:  100% 100% 100%  Weight: 111.7 kg  111 kg   Height: 5' 10.8" (1.798 m)       Intake/Output Summary (Last 24 hours) at 09/07/2020 1024 Last data filed at 09/07/2020 3875 Gross per  24 hour  Intake --  Output 450 ml  Net -450 ml   Filed Weights   09/06/20 1526 09/06/20 2153 09/07/20 0636  Weight: (!) 140 kg 111.7 kg 111 kg    Examination:  General exam: Confused, on room air Respiratory system: Clear to auscultation. Respiratory effort normal. Cardiovascular system: S1 & S2 heard, IRRR.  Gastrointestinal system: Protuberant abdomen ,soft and nontender.  Normal bowel sounds heard. Central nervous system: Confused, moving all extremities. Extremities: Generalized weakness, trace edema bilateral lower extremity Skin: Chronic venous stasis changes bilateral lower extremity Psychiatry: Confused.     Data Reviewed: I have personally reviewed following labs and imaging studies  CBC: Recent Labs  Lab 09/06/20 1522 09/07/20 0519  WBC 5.3 3.4*  NEUTROABS 3.9  --   HGB 8.9* 8.7*  HCT 27.8* 28.7*  MCV 92.7 97.3  PLT 60* 51*    Basic Metabolic Panel: Recent Labs  Lab 09/06/20 1522 09/07/20 0519  NA 141 143  K 4.8 3.7  CL 106 107  CO2 26 26  GLUCOSE 130* 100*  BUN 61* 58*  CREATININE 2.57* 2.44*  CALCIUM 8.5* 8.4*    GFR: Estimated Creatinine Clearance: 36.6 mL/min (A) (by C-G formula based on SCr of 2.44 mg/dL (H)).  Liver Function Tests: Recent Labs  Lab 09/06/20 1522 09/07/20 0519  AST 50* 36  ALT 48* 41  ALKPHOS 118 106  BILITOT 1.3* 1.4*  PROT 5.4* 5.3*  ALBUMIN 2.3* 2.2*    CBG: Recent Labs  Lab 09/06/20 2149 09/06/20 2247 09/07/20 0734  GLUCAP 46* 115* 92     Recent Results (from the past 240 hour(s))  Blood culture (routine single)     Status: None (Preliminary result)   Collection Time: 09/06/20  3:20 PM   Specimen: BLOOD LEFT FOREARM  Result Value Ref Range Status   Specimen Description   Final    BLOOD LEFT FOREARM Performed at [facility], [place] [address], [place], [place] [address]    Special Requests   Final    BOTTLES DRAWN AEROBIC AND ANAEROBIC Blood Culture adequate volume Performed at [place] [address], [place], [place] [address]    Culture  Setup Time   Final    GRAM POSITIVE RODS AEROBIC BOTTLE ONLY CRITICAL RESULT CALLED TO, READ BACK BY AND VERIFIED WITH: SCOTT CHRISTY PHARMD @0937  09/07/20 Performed at [facility], [place] [address], [place],  [address]    Culture GRAM POSITIVE RODS  Final   Report Status PENDING   Incomplete  Resp Panel by RT-PCR (Flu A&B, Covid) Nasopharyngeal Swab     Status: None   Collection Time: 09/06/20  4:22 PM   Specimen: Nasopharyngeal Swab; Nasopharyngeal(NP) swabs in vial transport medium  Result Value Ref Range Status   SARS Coronavirus 2 by RT PCR NEGATIVE NEGATIVE Final    Comment: (NOTE) SARS-CoV-2 target nucleic acids are NOT DETECTED.  The SARS-CoV-2 RNA is generally detectable in upper respiratory specimens during the acute phase of infection. The lowest concentration of SARS-CoV-2 viral copies this assay can detect is 138 copies/mL. A negative result does not preclude SARS-Cov-2 infection and should not be used as the sole basis for treatment or other patient management decisions. A negative result may occur with  improper specimen collection/handling, submission of specimen other than nasopharyngeal swab, presence of viral mutation(s) within the areas targeted by this assay, and inadequate number of viral copies(<138 copies/mL). A negative result must be combined with clinical observations,  patient history, and epidemiological information. The expected result is Negative.  Fact Sheet for Patients:  EntrepreneurPulse.com.au  Fact Sheet for Healthcare Providers:  IncredibleEmployment.be  This test is no t yet approved or cleared by the [place] FDA and  has been authorized for detection and/or diagnosis of SARS-CoV-2 by FDA under an Emergency Use Authorization (EUA). This EUA will remain  in effect (meaning this test can be used) for the duration of the COVID-19 declaration under Section 564(b)(1) of the Act, 21 U.S.C.section 360bbb-3(b)(1), unless the authorization is terminated  or revoked sooner.       Influenza A by PCR NEGATIVE NEGATIVE Final   Influenza B by PCR NEGATIVE NEGATIVE Final    Comment: (NOTE) The Xpert Xpress SARS-CoV-2/FLU/RSV plus assay is intended as an aid in the diagnosis of influenza  from Nasopharyngeal swab specimens and should not be used as a sole basis for treatment. Nasal washings and aspirates are unacceptable for Xpert Xpress SARS-CoV-2/FLU/RSV testing.  Fact Sheet for Patients: EntrepreneurPulse.com.au  Fact Sheet for Healthcare Providers: IncredibleEmployment.be  This test is not yet approved or cleared by the [place] FDA and has been authorized for detection and/or diagnosis of SARS-CoV-2 by FDA under an Emergency Use Authorization (EUA). This EUA will remain in effect (meaning this test can be used) for the duration of the COVID-19 declaration under Section 564(b)(1) of the Act, 21 U.S.C. section 360bbb-3(b)(1), unless the authorization is terminated or revoked.  Performed at [facility], [place] [address] [place], [place] [address]          Radiology Studies: DG Elbow Complete Right  Result Date: 09/06/2020 CLINICAL DATA:  Right elbow laceration. EXAM: RIGHT ELBOW - COMPLETE 3+ VIEW COMPARISON:  None. FINDINGS: There is a thin curvilinear lucency extending across a large bony spur along the right olecranon process. There is no evidence of dislocation. A mild amount of adjacent dorsal soft tissue swelling is noted. IMPRESSION: Findings which may represent a nondisplaced fracture involving a bony spur of the right olecranon process. Correlation with physical exam is recommended to determine the presence of point tenderness within this region. Electronically Signed   By: Virgina Norfolk M.D.   On: 09/06/2020 16:20   CT Head Wo Contrast  Result Date: 09/06/2020 CLINICAL DATA:  Altered mental status. EXAM: CT HEAD WITHOUT CONTRAST TECHNIQUE: Contiguous axial images were obtained from the base of the skull through the vertex without intravenous contrast. COMPARISON:  August 14, 2020. FINDINGS: Brain: No evidence of acute large vascular territory infarction, hemorrhage, hydrocephalus,  extra-axial collection or mass lesion/mass effect. Mild white matter hypodensities, likely related to chronic microvascular ischemic disease. Vascular: Mild calcific atherosclerosis. Skull: Normal. Negative for fracture or focal lesion. Sinuses/Orbits: Mild ethmoid air cell mucosal thickening. Otherwise, visualized sinuses are clear. Unremarkable orbits. Other: No mastoid effusions. IMPRESSION: No evidence of acute intracranial abnormality. Electronically Signed   By: Margaretha Sheffield MD   On: 09/06/2020 16:58   DG Chest Port 1 View  Result Date: 09/06/2020 CLINICAL DATA:  Altered mental status EXAM: PORTABLE CHEST 1 VIEW COMPARISON:  08/14/2020 FINDINGS: Low lung volumes. No new consolidation or edema. Improved aeration at left lung base. No pleural effusion. Stable mild cardiomegaly. IMPRESSION: No acute process in the chest. Electronically Signed   By: Macy Mis M.D.   On: 09/06/2020 16:18        Scheduled Meds: . albuterol  2 puff Inhalation Q6H  . atorvastatin  20 mg Oral q1800  . carvedilol  6.25  mg Oral BID WC  . ferrous sulfate  325 mg Oral Daily  . insulin aspart  0-5 Units Subcutaneous QHS  . insulin aspart  0-9 Units Subcutaneous TID WC  . insulin aspart protamine- aspart  20 Units Subcutaneous BID WC  . lactulose  60 g Oral TID  . pantoprazole  40 mg Oral BID AC  . rifaximin  550 mg Oral BID  . tamsulosin  0.4 mg Oral QPC supper  . torsemide  100 mg Oral Daily   Continuous Infusions:   LOS: 1 day   Time spent: 54mins Greater than 50% of this time was spent in counseling, explanation of diagnosis, planning of further management, and coordination of care.  I have personally reviewed and interpreted on  09/07/2020 daily labs, tele strips, I reviewed all nursing notes, pharmacy notes,  vitals, pertinent old records  I have discussed plan of care as described above with RN , patient  on 09/07/2020  Voice Recognition /Dragon dictation system was used to create this  note, attempts have been made to correct errors. Please contact the author with questions and/or clarifications.   Florencia Reasons, MD PhD FACP [facility]  Available via Epic secure chat 7am-7pm for nonurgent issues Please page for urgent issues To page the attending provider between 7A-7P or the covering provider during after hours 7P-7A, please log into the web site [email or web address] and access using universal [place] password for that web site. If you do not have the password, please call the hospital operator.    09/07/2020, 10:24 AM

## 2020-09-07 NOTE — Progress Notes (Signed)
PHARMACY - PHYSICIAN COMMUNICATION CRITICAL VALUE ALERT - BLOOD CULTURE IDENTIFICATION (BCID)  Corey West is an 69 y.o. male who presented to [name] on [facility]/06/2020 with a chief complaint of confusion & agitation  Assessment:  GPR in aerobic bottle (1/2 bottles) only 1 set collected. suspect contaminant. rec observe off abx  Name of physician (or Provider) Contacted: Dillard Cannon via [place]  Current antibiotics: none  Changes to prescribed antibiotics recommended:  rec observe off abx> accepted  No results found for this or any previous visit.  Eudelia Bunch, Pharm.D 09/07/2020 10:50 AM

## 2020-09-07 NOTE — Plan of Care (Signed)
  Problem: Education: Goal: Knowledge of General Education information will improve Description: Including pain rating scale, medication(s)/side effects and non-pharmacologic comfort measures Outcome: Progressing   Problem: Clinical Measurements: Goal: Ability to maintain clinical measurements within normal limits will improve Outcome: Progressing Goal: Will remain free from infection Outcome: Progressing   Problem: Activity: Goal: Risk for activity intolerance will decrease Outcome: Progressing   Problem: Coping: Goal: Level of anxiety will decrease Outcome: Progressing   Problem: Elimination: Goal: Will not experience complications related to bowel motility Outcome: Progressing   Problem: Pain Managment: Goal: General experience of comfort will improve Outcome: Progressing   Problem: Safety: Goal: Ability to remain free from injury will improve Outcome: Progressing   Problem: Skin Integrity: Goal: Risk for impaired skin integrity will decrease Outcome: Progressing

## 2020-09-07 NOTE — Progress Notes (Signed)
Hypoglycemic Event  CBG: 46  Treatment: D50 25 mL (12.5 gm)  Symptoms: None  Follow-up CBG: Time:2247 CBG Result:115  Possible Reasons for Event: Inadequate meal intake  Comments/MD notified:per protocol    Glenice Bow

## 2020-09-07 NOTE — Progress Notes (Signed)
Patient awake this evening but remains confused. Only alert to self.  Now combative and belligerent with staff.  Patient pulled telemetry off and sling off arm completely and will not allow to be replaced.  Keeps yelling that we are holding him against his will.  Attempted to give PRN dose of ativan but patient attempted to hit this RN when flushing IV.  Security was called and helped to calm patient some, then allowed IV to be flushed and medication given.  After medication given, [name] (niece) called. Patient keeps stating to niece "you can't just leave me here". Patient now slightly more calm.  Tele leads placed but would not allow to change gown and linens that drink spilled on.

## 2020-09-08 DIAGNOSIS — N184 Chronic kidney disease, stage 4 (severe): Secondary | ICD-10-CM

## 2020-09-08 DIAGNOSIS — Z7189 Other specified counseling: Secondary | ICD-10-CM

## 2020-09-08 DIAGNOSIS — K7460 Unspecified cirrhosis of liver: Secondary | ICD-10-CM

## 2020-09-08 LAB — COMPREHENSIVE METABOLIC PANEL
ALT: 42 U/L (ref 0–44)
AST: 39 U/L (ref 15–41)
Albumin: 2.2 g/dL — ABNORMAL LOW (ref 3.5–5.0)
Alkaline Phosphatase: 113 U/L (ref 38–126)
Anion gap: 10 (ref 5–15)
BUN: 54 mg/dL — ABNORMAL HIGH (ref 8–23)
CO2: 26 mmol/L (ref 22–32)
Calcium: 8.5 mg/dL — ABNORMAL LOW (ref 8.9–10.3)
Chloride: 111 mmol/L (ref 98–111)
Creatinine, Ser: 2.24 mg/dL — ABNORMAL HIGH (ref 0.61–1.24)
GFR, Estimated: 31 mL/min — ABNORMAL LOW (ref >60)
Glucose, Bld: 249 mg/dL — ABNORMAL HIGH (ref 70–99)
Potassium: 3.3 mmol/L — ABNORMAL LOW (ref 3.5–5.1)
Sodium: 147 mmol/L — ABNORMAL HIGH (ref 135–145)
Total Bilirubin: 1.2 mg/dL (ref 0.3–1.2)
Total Protein: 5.1 g/dL — ABNORMAL LOW (ref 6.5–8.1)

## 2020-09-08 LAB — GLUCOSE, CAPILLARY
Glucose-Capillary: 186 mg/dL — ABNORMAL HIGH (ref 70–99)
Glucose-Capillary: 166 mg/dL — ABNORMAL HIGH (ref 70–99)

## 2020-09-08 LAB — CBC WITH DIFFERENTIAL/PLATELET
Abs Immature Granulocytes: 0.02 10*3/uL (ref 0.00–0.07)
Basophils Absolute: 0 10*3/uL (ref 0.0–0.1)
Basophils Relative: 0 %
Eosinophils Absolute: 0 10*3/uL (ref 0.0–0.5)
Eosinophils Relative: 0 %
HCT: 27.6 % — ABNORMAL LOW (ref 39.0–52.0)
Hemoglobin: 8.6 g/dL — ABNORMAL LOW (ref 13.0–17.0)
Immature Granulocytes: 1 %
Lymphocytes Relative: 16 %
Lymphs Abs: 0.5 10*3/uL — ABNORMAL LOW (ref 0.7–4.0)
MCH: 29.2 pg (ref 26.0–34.0)
MCHC: 31.2 g/dL (ref 30.0–36.0)
MCV: 93.6 fL (ref 80.0–100.0)
Monocytes Absolute: 0.4 10*3/uL (ref 0.1–1.0)
Monocytes Relative: 12 %
Neutro Abs: 2.4 10*3/uL (ref 1.7–7.7)
Neutrophils Relative %: 71 %
Platelets: 54 10*3/uL — ABNORMAL LOW (ref 150–400)
RBC: 2.95 MIL/uL — ABNORMAL LOW (ref 4.22–5.81)
RDW: 17.5 % — ABNORMAL HIGH (ref 11.5–15.5)
WBC: 3.3 10*3/uL — ABNORMAL LOW (ref 4.0–10.5)
nRBC: 0 % (ref 0.0–0.2)

## 2020-09-08 LAB — PROTIME-INR
INR: 1.5 — ABNORMAL HIGH (ref 0.8–1.2)
Prothrombin Time: 17.7 seconds — ABNORMAL HIGH (ref 11.4–15.2)

## 2020-09-08 LAB — AMMONIA: Ammonia: 32 umol/L (ref 9–35)

## 2020-09-08 MED ORDER — HYDROMORPHONE HCL 1 MG/ML IJ SOLN: 0.5000 mg | INTRAMUSCULAR | Status: DC | PRN

## 2020-09-08 MED ORDER — LORAZEPAM 2 MG/ML IJ SOLN: 1.0000 mg | INTRAMUSCULAR | Status: DC | PRN

## 2020-09-08 MED ORDER — LORAZEPAM 1 MG PO TABS: 1.0000 mg | ORAL_TABLET | Freq: Once | ORAL | Status: AC

## 2020-09-08 MED ORDER — MAGNESIUM SULFATE 2 GM/50ML IV SOLN
2.0000 g | Freq: Once | INTRAVENOUS | Status: AC
  Administered 2020-09-08: 2 g via INTRAVENOUS
  Filled 2020-09-08: qty 50

## 2020-09-08 MED ORDER — GLYCOPYRROLATE 0.2 MG/ML IJ SOLN
0.2000 mg | Freq: Three times a day (TID) | INTRAMUSCULAR | Status: DC
  Administered 2020-09-08 – 2020-09-12 (×13): 0.2 mg via INTRAVENOUS
  Filled 2020-09-08 (×13): qty 1

## 2020-09-08 MED ORDER — LORAZEPAM 2 MG/ML IJ SOLN
1.0000 mg | Freq: Once | INTRAMUSCULAR | Status: AC
  Administered 2020-09-08: 1 mg via INTRAMUSCULAR
  Filled 2020-09-08: qty 1

## 2020-09-08 MED ORDER — POLYVINYL ALCOHOL 1.4 % OP SOLN
1.0000 [drp] | Freq: Four times a day (QID) | OPHTHALMIC | Status: DC | PRN
  Filled 2020-09-08: qty 15

## 2020-09-08 MED ORDER — BIOTENE DRY MOUTH MT LIQD: 15.0000 mL | OROMUCOSAL | Status: DC | PRN

## 2020-09-08 MED ORDER — LORAZEPAM 2 MG/ML IJ SOLN
1.0000 mg | INTRAMUSCULAR | Status: DC | PRN
  Administered 2020-09-08 – 2020-09-09 (×2): 2 mg via INTRAVENOUS
  Filled 2020-09-08 (×2): qty 1

## 2020-09-08 MED ORDER — GLYCOPYRROLATE 0.2 MG/ML IJ SOLN: 0.4000 mg | INTRAMUSCULAR | Status: DC | PRN

## 2020-09-08 MED ORDER — POTASSIUM CHLORIDE CRYS ER 20 MEQ PO TBCR
40.0000 meq | EXTENDED_RELEASE_TABLET | Freq: Once | ORAL | Status: AC
  Administered 2020-09-08: 40 meq via ORAL
  Filled 2020-09-08: qty 2

## 2020-09-08 MED ORDER — LORAZEPAM 2 MG/ML IJ SOLN
1.0000 mg | INTRAMUSCULAR | Status: AC
  Administered 2020-09-08: 1 mg via INTRAVENOUS
  Filled 2020-09-08: qty 1

## 2020-09-08 NOTE — Consult Note (Signed)
Consultation Note Date: 09/08/2020   Patient Name: Corey West  DOB: 08/17/1951  MRN: [id number]  Age / Sex: 69 y.o., male  PCP: Leonel Ramsay, MD Referring Physician: Florencia Reasons, MD  Reason for Consultation: Establishing goals of care  HPI/Patient Profile: 69 y.o. male  with past medical history of cirrhosis of the liver, portal hypertensive gastropathy, hepatic encephalopathy, chronic diastolic CHF, type II DM, CKD stage 4, HTN, chronic anemia, anxiety, BPH admitted on 09/06/2020 with confusion and agitation with hepatic encephalopathy and nondisplaced right olecranon fracture. Ammonia down to 32 from 174.   Clinical Assessment and Goals of Care: Mr. Magner is known to me from previous admission and palliative consultation. When I met with him today he is much different than my previous consultation with him. He was sleeping but awoke to my voice when I entered his room. He is very tearful and tells me that he wants to die and that he never felt he would say that but he is miserable and also scared. He is adamant that he needs to speak with Etta Quill who is an attorney because he knows his wishes and what to do. I calmed him down and then called his niece.   I spoke with niece, [name], and expressed to her my experience with Mr. Yam past and now. He was very set on obtaining liver transplant when I met him a month ago but today is telling me that he does not wish to live this way. [name] has good understanding of his poor prognosis and also knows that a liver transplant is not a realistic option for him at this stage. She assisted me with contact information and I assisted Mr. Merrick to call and speak with both [name] and Shanon Brow as requested. He was very emotional during these conversations and his heightened emotions made him more confused and upset. I assisted to calm him again.   I discussed further with [name]  goals for Mr. Conover. We discussed that he has suffered and his disease is only getting worse. Given his expressed wishes today and their previous conversations she agrees with DNR and comfort care measures. We agreed to stop lactulose with expectations that he will slip into a comatose like state. Comfort measures will begin. Angel requests transition to hospice facility in [place] for end of life care.   All questions/concerns addressed. Emotional support provided. Updated Dr. Erlinda Hong and RN.   Primary Decision Maker HCPOA niece [name]    SUMMARY OF RECOMMENDATIONS   - DNR - Comfort measures - Hopeful for hospice facility [place] Planning:  DNR   Symptom Management:   As needed medications for pain, shortness of breath, anxiety, secretions added.   Scheduled robinul for secretion management.   D/C all medications not adding to comfort.   Palliative Prophylaxis:   Frequent Pain Assessment, Oral Care and Turn Reposition  Additional Recommendations (Limitations, Scope, Preferences):  Full Comfort Care  Psycho-social/Spiritual:   Desire for further Chaplaincy support:yes  Additional Recommendations: Education  on Hospice and Grief/Bereavement Support  Prognosis:   < 2 weeks  Discharge Planning: Hospice facility      Primary Diagnoses: Present on Admission: . Hepatic encephalopathy ([place])   I have reviewed the medical record, interviewed the patient and family, and examined the patient. The following aspects are pertinent.  Past Medical History:  Diagnosis Date  . A-fib ([place])   . Anemia   . Anxiety    controlled;   . Arthritis   . AVM (arteriovenous malformation) of stomach, acquired with hemorrhage   . Barrett's esophagus   . CHF (congestive heart failure) ([place])   . Chronic kidney disease    renal infufficiency  . Cirrhosis ([place])   . COPD (chronic obstructive pulmonary disease) ([place])   . Depression    controlled;   Marland Kitchen Diabetes  mellitus without complication ([place])    not controlled, taking insulin but sugar continues to run high;   Marland Kitchen Dysrhythmia   . Edema   . Esophageal varices ([place])   . GAVE (gastric antral vascular ectasia)   . GERD (gastroesophageal reflux disease)   . Hepatic encephalopathy ([place])   . History of hiatal hernia   . Hyperlipidemia   . Hypertension    controlled well;   . IPMN (intraductal papillary mucinous neoplasm)   . Liver cirrhosis ([place])   . Nephropathy, diabetic ([place])   . Obesity   . Pancytopenia ([place])   . Polyp, stomach    with chronic blood loss  . Portal hypertensive gastropathy ([place])   . Sleep apnea    does not wear a cpap, Medicare would not pay for it   . Venous stasis dermatitis of both lower extremities   . Venous stasis of both lower extremities    with cellulitis   Social History   Socioeconomic History  . Marital status: Single    Spouse name: Not on file  . Number of children: Not on file  . Years of education: Not on file  . Highest education level: Not on file  Occupational History  . Occupation: disabled  Tobacco Use  . Smoking status: Former Smoker    Years: 20.00    Types: Cigars, Cigarettes  . Smokeless tobacco: Never Used  Vaping Use  . Vaping Use: Never used  Substance and Sexual Activity  . Alcohol use: No    Comment: stopped 15 years ago  . Drug use: No  . Sexual activity: Not Currently  Other Topics Concern  . Not on file  Social History Narrative   Lives alone, neighbor helps occasionally    Social Determinants of Health   Financial Resource Strain: Not on file  Food Insecurity: Not on file  Transportation Needs: Not on file  Physical Activity: Not on file  Stress: Not on file  Social Connections: Not on file   Family History  Problem Relation Age of Onset  . Diabetes Other   . Transient ischemic attack Father   . CAD Father    Scheduled Meds: . albuterol  2 puff Inhalation TID  . atorvastatin  20 mg Oral q1800  . carvedilol   6.25 mg Oral BID WC  . ferrous sulfate  325 mg Oral Daily  . insulin aspart  0-5 Units Subcutaneous QHS  . insulin aspart  0-9 Units Subcutaneous TID WC  . insulin aspart protamine- aspart  6 Units Subcutaneous BID WC  . lactulose  60 g Oral TID  . pantoprazole  40 mg Oral BID AC  . rifaximin  550 mg Oral BID  . tamsulosin  0.4 mg Oral QPC supper  . torsemide  100 mg Oral Daily   Continuous Infusions: PRN Meds:.albuterol, guaiFENesin-dextromethorphan, LORazepam, ondansetron **OR** ondansetron (ZOFRAN) IV No Known Allergies Review of Systems  Unable to perform ROS: Acuity of condition    Physical Exam Vitals and nursing note reviewed.  Constitutional:      Appearance: He is ill-appearing.     Comments: Emotional distress  Cardiovascular:     Rate and Rhythm: Tachycardia present.  Pulmonary:     Effort: Pulmonary effort is normal. No tachypnea, accessory muscle usage or respiratory distress.  Abdominal:     Palpations: Abdomen is soft.  Neurological:     Mental Status: He is alert. He is confused.  Psychiatric:        Mood and Affect: Mood is anxious. Affect is tearful.     Vital Signs: BP 109/70 (BP Location: Left Arm)   Pulse (!) 110   Temp (!) 97.4 F (36.3 C)   Resp 19   Ht 5' 10.8" (1.798 m)   Wt 111 kg   SpO2 100%   BMI 34.32 kg/m  Pain Scale: 0-10   Pain Score: 0-No pain   SpO2: SpO2: 100 % O2 Device:SpO2: 100 % O2 Flow Rate: .   IO: Intake/output summary: No intake or output data in the 24 hours ending 09/08/20 0857  LBM: Last BM Date: 09/07/20 Baseline Weight: Weight: (!) 140 kg Most recent weight: Weight: 111 kg     Palliative Assessment/Data:     Time In: 1550 Time Out: 1700 Time Total: 70 min Greater than 50%  of this time was spent counseling and coordinating care related to the above assessment and plan.  Signed by: Vinie Sill, NP Palliative Medicine Team Pager # [phone] (M-F 8a-5p) Team Phone # [phone]  (Nights/Weekends)

## 2020-09-08 NOTE — Progress Notes (Signed)
Much calm now. Attempted to clean up refusing. Dont want to be touch. Will report to oncoming nurse.

## 2020-09-08 NOTE — Progress Notes (Signed)
Awake confused and screaming out. Pulled of  Monitor external cath and brace off arm to floor. Stool on hands bed and every where. Cleaned up complete bath and completed bed changed. Placed back on tele. Brace left off until am for [place] tech to place. Ativan  0.5mg  IVP given. Will cont to assess Neuro status.

## 2020-09-08 NOTE — Progress Notes (Signed)
PROGRESS NOTE    Corey West  PTW:[id number] DOB: [birth year] DOA: 09/06/2020 PCP: Leonel Ramsay, MD    Chief Complaint  Patient presents with  . Altered Mental Status    Brief Narrative:  Corey West is a 69 y.o. male with Past medical history of cirrhosis of the liver, portal hypertensive gastropathy, hepatic encephalopathy, chronic diastolic CHF, type II DM, CKD 3B, HTN, chronic anemia, anxiety, BPH.  Presents with complaints of confusion and agitation, found to have hepatic encephalopathy and nondisplaced right olecranon fracture He is from [facility] facility  Subjective:  Ammonia level 32 but patient Remains confused, not following command consistently  Assessment & Plan:   Active Problems:   Hepatic encephalopathy (HCC)   Acute hepatic encephalopathy with h/o Cirrhosisof liver , portal hypertension esophageal varices, and chronic thrombocytopenia -Received lactulose enema on admission -Appear more awake although still confused, will continue oral lactulose, rifaximin -Advance diet as tolerated  Full in the facility likely secondary to encephalopathy lead to nondisplaced right olecranon fracture. On-call orthopedic was consulted. Nonoperative conservative measures recommended. Currently wearing the sling.   Hypokalemia/hypomagnesemia  replace k/mag  Lactic acidosis present on admission, normalized  Elevated LFT Improved   CKDIV Creatinine appears at baseline Renal dosing meds  Anemia of chronic disease Hemoglobin 8.7  Paroxysmal A. Fib, Essential hypertension, Chronic HFpEF Not on any anticoagulation given severe thrombocytopenia Echocardiogram done on July 2021 withLVEF 50 to 55%, no evidence of focal wall motion abnormalities, grade 2 diastolic dysfunction. -Currently in A. 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 Controlled, A1c 6.7 A.m. blood glucose 100 decrease meal coverage  insulin from 20units bid with meal to 6units bid with meal due to confusion and poor oral intake, continue SSI  BPH, continue Flomax  Recent COVID pneumonia, was tested positive on November 14, treated with remdesivir and Decadron Repeat Covid testing this admission negative  Body mass index is 34.32 kg/m.   FTT, frequent hospitalizations, deceased 54 hospitalizations this year  palliative care consulted for goals of care discussion    DVT prophylaxis: Place and maintain sequential compression device Start: 09/07/20 0535   Code Status:full Family Communication: patient Disposition:   Status is: Inpatient  Dispo: The patient is from: SNF              Anticipated d/c is to: SNF with palliative care              Anticipated d/c date is: >3 days              Patient currently not ready to discharge  Consultants:   Palliative care  Procedures:   None  Antimicrobials:   None     Objective: Vitals:   09/07/20 1357 09/07/20 2048 09/07/20 2120 09/08/20 0900  BP:   109/70   Pulse:   (!) 110   Resp:   19   Temp:   (!) 97.4 F (36.3 C)   TempSrc:      SpO2: 98% 98% 100% 99%  Weight:      Height:       No intake or output data in the 24 hours ending 09/08/20 [place]  09/06/20 1526 09/06/20 2153 09/07/20 0636  Weight: (!) 140 kg 111.7 kg 111 kg    Examination:  General exam: Confused, on room air Respiratory system: Clear to auscultation. Respiratory effort normal. Cardiovascular system: S1 & S2 heard, IRRR.  Gastrointestinal system: Protuberant abdomen ,soft and nontender. Normal bowel sounds heard. Central nervous system: Confused, moving all  extremities. Extremities: Generalized weakness, trace edema bilateral lower extremity Skin: Chronic venous stasis changes bilateral lower extremity Psychiatry: Confused.     Data Reviewed: I have personally reviewed following labs and imaging studies  CBC: Recent Labs  Lab 09/06/20 1522 09/07/20 0519 09/08/20 0435  WBC 5.3 3.4* 3.3*  NEUTROABS 3.9  --  2.4  HGB 8.9* 8.7* 8.6*  HCT 27.8* 28.7* 27.6*  MCV 92.7 97.3 93.6  PLT 60* 51* 54*    Basic Metabolic Panel: Recent Labs  Lab 09/06/20 1522 09/07/20 0519 09/08/20 0435  NA 141 143 147*  K 4.8 3.7 3.3*  CL 106 107 111  CO2 26 26 26   GLUCOSE 130* 100* 249*  BUN 61* 58* 54*  CREATININE 2.57* 2.44* 2.24*  CALCIUM 8.5* 8.4* 8.5*    GFR: Estimated Creatinine Clearance: 39.9 mL/min (A) (by C-G formula based on SCr of 2.24 mg/dL (H)).  Liver Function Tests: Recent Labs  Lab 09/06/20 1522 09/07/20 0519 09/08/20 0435  AST 50* 36 39  ALT 48* 41 42  ALKPHOS 118 106 113  BILITOT 1.3* 1.4* 1.2  PROT 5.4* 5.3* 5.1*  ALBUMIN 2.3* 2.2* 2.2*    CBG: Recent Labs  Lab 09/07/20 0734 09/07/20 1147 09/07/20 1650 09/07/20 2057 09/08/20 0739  GLUCAP 92 125* 134* 274* 186*     Recent Results (from the past 240 hour(s))  Blood culture (routine single)     Status: None (Preliminary result)   Collection Time: 09/06/20  3:20 PM   Specimen: BLOOD LEFT FOREARM  Result Value Ref Range Status   Specimen Description   Final    BLOOD LEFT FOREARM Performed at [facility], [place] [address], [place], [place] [address]    Special Requests   Final    BOTTLES DRAWN AEROBIC AND ANAEROBIC Blood Culture adequate volume Performed at [place] [address] [place], [place] [address]    Culture  Setup Time   Final    GRAM POSITIVE RODS AEROBIC BOTTLE ONLY CRITICAL RESULT CALLED TO, READ BACK BY AND VERIFIED WITH: SCOTT CHRISTY PHARMD @0937  09/07/20 Performed at [place] [address] [place], [place] [address]    Culture GRAM POSITIVE RODS  Final   Report Status PENDING  Incomplete  Resp Panel by RT-PCR (Flu A&B, Covid) Nasopharyngeal Swab     Status: None   Collection Time: 09/06/20  4:22 PM   Specimen: Nasopharyngeal Swab; Nasopharyngeal(NP) swabs in vial transport medium  Result Value Ref Range Status   SARS Coronavirus 2 by RT PCR NEGATIVE NEGATIVE Final    Comment: (NOTE) SARS-CoV-2 target nucleic acids are NOT DETECTED.  The SARS-CoV-2 RNA is generally detectable in upper respiratory specimens during the acute phase of infection. The lowest concentration of SARS-CoV-2 viral copies this assay can detect is 138 copies/mL. A negative result does not preclude SARS-Cov-2 infection and should not be used as the sole basis for treatment or other patient management decisions. A negative result may occur with  improper specimen collection/handling, submission of specimen other than nasopharyngeal swab, presence of viral mutation(s) within the areas targeted by this assay, and inadequate number  of viral copies(<138 copies/mL). A negative result must be combined with clinical observations, patient history, and epidemiological information. The expected result is Negative.  Fact Sheet for Patients:  EntrepreneurPulse.com.au  Fact Sheet for Healthcare Providers:  IncredibleEmployment.be  This test is no t yet approved or cleared by the [place] FDA and  has been authorized for detection and/or diagnosis of SARS-CoV-2 by FDA under an Emergency Use Authorization (EUA). This EUA will remain  in effect (meaning this test can be used) for the duration of the COVID-19 declaration under Section 564(b)(1) of the Act, 21 U.S.C.section 360bbb-3(b)(1), unless the authorization is terminated  or revoked sooner.       Influenza A by PCR NEGATIVE NEGATIVE Final   Influenza B by PCR NEGATIVE NEGATIVE Final    Comment:  (NOTE) The Xpert Xpress SARS-CoV-2/FLU/RSV plus assay is intended as an aid in the diagnosis of influenza from Nasopharyngeal swab specimens and should not be used as a sole basis for treatment. Nasal washings and aspirates are unacceptable for Xpert Xpress SARS-CoV-2/FLU/RSV testing.  Fact Sheet for Patients: EntrepreneurPulse.com.au  Fact Sheet for Healthcare Providers: IncredibleEmployment.be  This test is not yet approved or cleared by the [place] FDA and has been authorized for detection and/or diagnosis of SARS-CoV-2 by FDA under an Emergency Use Authorization (EUA). This EUA will remain in effect (meaning this test can be used) for the duration of the COVID-19 declaration under Section 564(b)(1) of the Act, 21 U.S.C. section 360bbb-3(b)(1), unless the authorization is terminated or revoked.  Performed at [facility], [place] [address], [place], [place] [address]          Radiology Studies: DG Elbow Complete Right  Result Date: 09/06/2020 CLINICAL DATA:  Right elbow laceration. EXAM: RIGHT ELBOW - COMPLETE 3+ VIEW COMPARISON:  None. FINDINGS: There is a thin curvilinear lucency extending across a large bony spur along the right olecranon process. There is no evidence of dislocation. A mild amount of adjacent dorsal soft tissue swelling is noted. IMPRESSION: Findings which may represent a nondisplaced fracture involving a bony spur of the right olecranon process. Correlation with physical exam is recommended to determine the presence of point tenderness within this region. Electronically Signed   By: Virgina Norfolk M.D.   On: 09/06/2020 16:20   CT Head Wo Contrast  Result Date: 09/06/2020 CLINICAL DATA:  Altered mental status. EXAM: CT HEAD WITHOUT CONTRAST TECHNIQUE: Contiguous axial images were obtained from the base of the skull through the vertex without intravenous contrast. COMPARISON:  August 14, 2020.  FINDINGS: Brain: No evidence of acute large vascular territory infarction, hemorrhage, hydrocephalus, extra-axial collection or mass lesion/mass effect. Mild white matter hypodensities, likely related to chronic microvascular ischemic disease. Vascular: Mild calcific atherosclerosis. Skull: Normal. Negative for fracture or focal lesion. Sinuses/Orbits: Mild ethmoid air cell mucosal thickening. Otherwise, visualized sinuses are clear. Unremarkable orbits. Other: No mastoid effusions. IMPRESSION: No evidence of acute intracranial abnormality. Electronically Signed   By: Margaretha Sheffield MD   On: 09/06/2020 16:58   DG Chest Port 1 View  Result Date: 09/06/2020 CLINICAL DATA:  Altered mental status EXAM: PORTABLE CHEST 1 VIEW COMPARISON:  08/14/2020 FINDINGS: Low lung volumes. No new consolidation or edema. Improved aeration at left lung base. No pleural effusion. Stable mild cardiomegaly. IMPRESSION: No acute process in the chest. Electronically Signed   By: Macy Mis M.D.   On: 09/06/2020 16:18        Scheduled Meds: . albuterol  2 puff Inhalation TID  .  atorvastatin  20 mg Oral q1800  . carvedilol  6.25 mg Oral BID WC  . ferrous sulfate  325 mg Oral Daily  . insulin aspart  0-5 Units Subcutaneous QHS  . insulin aspart  0-9 Units Subcutaneous TID WC  . insulin aspart protamine- aspart  6 Units Subcutaneous BID WC  . lactulose  60 g Oral TID  . pantoprazole  40 mg Oral BID AC  . potassium chloride  40 mEq Oral Once  . rifaximin  550 mg Oral BID  . tamsulosin  0.4 mg Oral QPC supper  . torsemide  100 mg Oral Daily   Continuous Infusions:   LOS: 2 days   Time spent: 83mins Greater than 50% of this time was spent in counseling, explanation of diagnosis, planning of further management, and coordination of care.  I have personally reviewed and interpreted on  09/08/2020 daily labs, tele strips, I reviewed all nursing notes, pharmacy notes,  vitals, pertinent old records  I have  discussed plan of care as described above with RN , patient  on 09/08/2020  Voice Recognition /Dragon dictation system was used to create this note, attempts have been made to correct errors. Please contact the author with questions and/or clarifications.   Florencia Reasons, MD PhD FACP [facility]  Available via Epic secure chat 7am-7pm for nonurgent issues Please page for urgent issues To page the attending provider between 7A-7P or the covering provider during after hours 7P-7A, please log into the web site [email or web address] and access using universal [place] password for that web site. If you do not have the password, please call the hospital operator.    09/08/2020, 9:44 AM

## 2020-09-08 NOTE — Plan of Care (Signed)
  Problem: Clinical Measurements: Goal: Will remain free from infection Outcome: Progressing   Problem: Activity: Goal: Risk for activity intolerance will decrease Outcome: Progressing   Problem: Elimination: Goal: Will not experience complications related to bowel motility Outcome: Progressing   Problem: Pain Managment: Goal: General experience of comfort will improve Outcome: Progressing   Problem: Safety: Goal: Ability to remain free from injury will improve Outcome: Progressing   Problem: Skin Integrity: Goal: Risk for impaired skin integrity will decrease Outcome: Progressing   
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
## 2020-09-09 DIAGNOSIS — F419 Anxiety disorder, unspecified: Secondary | ICD-10-CM

## 2020-09-09 DIAGNOSIS — Z515 Encounter for palliative care: Secondary | ICD-10-CM

## 2020-09-09 LAB — BLOOD CULTURE ID PANEL (REFLEXED) - BCID2

## 2020-09-09 MED ORDER — HYDROMORPHONE HCL 1 MG/ML IJ SOLN
0.5000 mg | INTRAMUSCULAR | Status: DC
  Administered 2020-09-09 – 2020-09-12 (×20): 0.5 mg via INTRAVENOUS
  Filled 2020-09-09 (×20): qty 0.5

## 2020-09-09 MED ORDER — HALOPERIDOL LACTATE 5 MG/ML IJ SOLN
2.0000 mg | INTRAMUSCULAR | Status: DC
  Administered 2020-09-09 – 2020-09-12 (×20): 2 mg via INTRAVENOUS
  Filled 2020-09-09 (×20): qty 1

## 2020-09-09 MED ORDER — HALOPERIDOL LACTATE 5 MG/ML IJ SOLN
2.0000 mg | Freq: Four times a day (QID) | INTRAMUSCULAR | Status: DC
  Filled 2020-09-09: qty 1

## 2020-09-09 MED ORDER — HALOPERIDOL LACTATE 5 MG/ML IJ SOLN: 2.0000 mg | Freq: Four times a day (QID) | INTRAMUSCULAR | Status: DC | PRN

## 2020-09-09 MED ORDER — LORAZEPAM 2 MG/ML IJ SOLN
2.0000 mg | INTRAMUSCULAR | Status: DC | PRN
  Administered 2020-09-10 – 2020-09-12 (×3): 2 mg via INTRAVENOUS
  Filled 2020-09-09 (×3): qty 1

## 2020-09-09 NOTE — TOC Progression Note (Signed)
Transition of Care [facility]) - Progression Note    Patient Details  Name: Corey West MRN: [id number] Date of Birth: Nov 20, 1950  Transition of Care [facility]) CM/SW Contact  Purcell Mouton, RN Phone Number: 09/09/2020, 2:06 PM  Clinical Narrative:     Referral for [facility] in [place] was given to [place], RN with [facility], there are no beds at present time.   Expected Discharge Plan: Skilled Nursing Facility Barriers to Discharge: No Barriers Identified  Expected Discharge Plan and Services Expected Discharge Plan: [facility]   Discharge Planning Services: CM Consult   Living arrangements for the past 2 months: [place]                                       Social Determinants of Health (SDOH) Interventions    Readmission Risk Interventions Readmission Risk Prevention Plan 08/16/2020 07/27/2020 06/09/2020  Transportation Screening Complete Complete Complete  PCP or Specialist Appt within 3-5 Days - - -  HRI or [place] Work Consult for [place] Planning/Counseling - - -  [place] - - -  Medication Review Press photographer) Complete Complete Complete  PCP or Specialist appointment within 3-5 days of discharge Complete Complete Complete  HRI or Home Care Consult Complete Complete Complete  SW Recovery Care/Counseling Consult Complete Complete Complete  SW Consult Not Complete Comments - - -  Palliative Care Screening Complete Not Applicable Not Applicable  Skilled Nursing Facility Complete Complete Not Applicable  Some recent data might be hidden

## 2020-09-09 NOTE — Progress Notes (Signed)
Patient have been off restraint since 1pm, still confused but calm and not pulling tubes.
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
## 2020-09-09 NOTE — Progress Notes (Signed)
Screaming out! Confused and disoriented, agitated medicated with Ativan 2mg  IVP given. Will cont to assess.

## 2020-09-09 NOTE — Progress Notes (Addendum)
Manufacturing engineer [facility])  Referral received for residential hospice at [facility] in [place].  There is not a bed to offer at [facility] today.   ACC MD will need to review to determine eligibility.  [facility] will update TOC once this had been determined and once bed status changes at [facility].  Venia Carbon RN, BSN, [facility] Liaison

## 2020-09-09 NOTE — Progress Notes (Signed)
PHARMACY - PHYSICIAN COMMUNICATION CRITICAL VALUE ALERT - BLOOD CULTURE IDENTIFICATION (BCID)  Corey West is an 69 y.o. male who presented to [facility] on 09/06/2020 with a chief complaint of confusion & agitation  Assessment: On 12/8:  GPR in aerobic bottle (1/2 bottles) only 1 set collected. suspect contaminant. rec observe off abx  Today- Now reporting GPC growing in anaerobic bottle of 1 set.  Verbally told this was S. Epi +MecA resistance.   He remains without s/sx of infection (Afebrile, no leukocytosis).  Suspect this is also contaminant.   Patient is now comfort care.   Name of physician (or Provider) Contacted: Ardith Dark  Current antibiotics: none  Changes to prescribed antibiotics recommended:  rec observe off abx> accepted  No results found for this or any previous visit.  Netta Cedars, Pharm.D 09/09/2020 12:40 AM

## 2020-09-09 NOTE — Care Management Important Message (Signed)
Important Message  Patient Details IM Letter given to the Patient. Name: Corey West MRN: [id number] Date of Birth: 13-Oct-1950   Medicare Important Message Given:  Yes     Kerin Salen 09/09/2020, 1:01 PM

## 2020-09-09 NOTE — Progress Notes (Signed)
PROGRESS NOTE    Corey West  XFG:[id number] DOB: [birth year] DOA: 09/06/2020 PCP: Leonel Ramsay, MD    Chief Complaint  Patient presents with  . Altered Mental Status    Brief Narrative:  Corey West is a 69 y.o. male with Past medical history of cirrhosis of the liver, portal hypertensive gastropathy, hepatic encephalopathy, chronic diastolic CHF, type II DM, CKD 3B, HTN, chronic anemia, anxiety, BPH.  Presents with complaints of confusion and agitation, found to have hepatic encephalopathy and nondisplaced right olecranon fracture He is from [facility] facility  Subjective:  Remain confused, does not recognize needs Is now on full comfort measures  Assessment & Plan:   Active Problems:   Hepatic encephalopathy (HCC)   Acute hepatic encephalopathy with h/o Cirrhosisof liver , portal hypertension esophageal varices, and chronic thrombocytopenia -Received lactulose enema on admission -Appear more awake although still confused, received oral lactulose, rifaximin -Is now on full comfort measures  Full in the facility likely secondary to encephalopathy lead to nondisplaced right olecranon fracture. On-call orthopedic was consulted. Nonoperative conservative measures recommended. Currently wearing the sling.   Hypokalemia/hypomagnesemia  replaced k/mag Is now on full comfort measures  Lactic acidosis present on admission, normalized  Elevated LFT Improved   CKDIV Creatinine appears at baseline Renal dosing meds Is now on full comfort measures  Anemia of chronic disease Hemoglobin 8.7  Paroxysmal A. Fib, Essential hypertension, Chronic HFpEF Not on any anticoagulation given severe thrombocytopenia Echocardiogram done on July 2021 withLVEF 50 to 55%, no evidence of focal wall motion abnormalities, grade 2 diastolic dysfunction. 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 Controlled, A1c  6.7 A.m. blood glucose 100 Is now on full comfort measures  BPH, Is now on full comfort measures  Recent COVID pneumonia, was tested positive on November 14, treated with remdesivir and Decadron Repeat Covid testing this admission negative  Body mass index is 34.32 kg/m.   FTT, frequent hospitalizations, deceased 48 hospitalizations this year  palliative care consulted for goals of care discussion Is now on full comfort measures Palliative care continue to follow, input appreciated    DVT prophylaxis: Is now on full comfort measures   Code Status:DNR Family Communication: patient's niece Disposition:   Status is: Inpatient  Dispo: The patient is from: SNF              Anticipated d/c is to: Residential hospice, awaiting for bed                Consultants:   Palliative care  Procedures:   None  Antimicrobials:   None     Objective: Vitals:   09/07/20 2120 09/08/20 0900 09/08/20 1459 09/09/20 1503  BP: 109/70   105/71  Pulse: (!) 110   (!) 126  Resp: 19   12  Temp:    98.6 F (37 C)  TempSrc:      SpO2: 100% 99% 98% 100%  Weight:      Height:        Intake/Output Summary (Last 24 hours) at 09/09/2020 1505 Last data filed at 09/09/2020 0834 Gross per 24 hour  Intake 1800 ml  Output 900 ml  Net 900 ml   Filed Weights   09/06/20 1526 09/06/20 2153 09/07/20 0636  Weight: (!) 140 kg 111.7 kg 111 kg    Examination:  General exam: Confused, on room air Respiratory system: Clear to auscultation. Respiratory effort normal. Cardiovascular system: S1 & S2 heard,  IRRR.  Gastrointestinal system: Protuberant abdomen ,soft and nontender. Normal bowel sounds heard. Central nervous system: Confused, moving all extremities. Extremities: Generalized weakness, trace edema bilateral lower extremity Skin: Chronic venous stasis changes bilateral lower extremity Psychiatry: Confused.     Data Reviewed: I have personally reviewed following labs and imaging  studies  CBC: Recent Labs  Lab 09/06/20 1522 09/07/20 0519 09/08/20 0435  WBC 5.3 3.4* 3.3*  NEUTROABS 3.9  --  2.4  HGB 8.9* 8.7* 8.6*  HCT 27.8* 28.7* 27.6*  MCV 92.7 97.3 93.6  PLT 60* 51* 54*    Basic Metabolic Panel: Recent Labs  Lab 09/06/20 1522 09/07/20 0519 09/08/20 0435  NA 141 143 147*  K 4.8 3.7 3.3*  CL 106 107 111  CO2 26 26 26   GLUCOSE 130* 100* 249*  BUN 61* 58* 54*  CREATININE 2.57* 2.44* 2.24*  CALCIUM 8.5* 8.4* 8.5*    GFR: Estimated Creatinine Clearance: 39.9 mL/min (A) (by C-G formula based on SCr of 2.24 mg/dL (H)).  Liver Function Tests: Recent Labs  Lab 09/06/20 1522 09/07/20 0519 09/08/20 0435  AST 50* 36 39  ALT 48* 41 42  ALKPHOS 118 106 113  BILITOT 1.3* 1.4* 1.2  PROT 5.4* 5.3* 5.1*  ALBUMIN 2.3* 2.2* 2.2*    CBG: Recent Labs  Lab 09/07/20 1147 09/07/20 1650 09/07/20 2057 09/08/20 0739 09/08/20 1143  GLUCAP 125* 134* 274* 186* 166*     Recent Results (from the past 240 hour(s))  Blood culture (routine single)     Status: Abnormal (Preliminary result)   Collection Time: 09/06/20  3:20 PM   Specimen: BLOOD LEFT FOREARM  Result Value Ref Range Status   Specimen Description   Final    BLOOD LEFT FOREARM Performed at [facility], [place] [address] [place], [place] [address]    Special Requests   Final    BOTTLES DRAWN AEROBIC AND ANAEROBIC Blood Culture adequate volume Performed at [place] [address], [place], [place] [address]    Culture  Setup Time   Final    GRAM POSITIVE RODS AEROBIC BOTTLE ONLY CRITICAL RESULT CALLED TO, READ BACK BY AND VERIFIED WITH: SCOTT CHRISTY PHARMD @0937  09/07/20 ANAEROBIC BOTTLE ONLY GRAM POSITIVE COCCI CRITICAL RESULT CALLED TO, READ BACK BY AND VERIFIED WITH: PHARMD M LILLISTON 09/09/20 AT 0033     Culture (A)  Final    BACILLUS SPECIES Standardized susceptibility testing for this organism is not available. STAPHYLOCOCCUS  EPIDERMIDIS THE SIGNIFICANCE OF ISOLATING THIS ORGANISM FROM A SINGLE VENIPUNCTURE CANNOT BE PREDICTED WITHOUT FURTHER CLINICAL AND CULTURE CORRELATION. SUSCEPTIBILITIES AVAILABLE ONLY ON REQUEST. Performed at [facility], [place] [address], [place], [place] [address]    Report Status PENDING  Incomplete  Blood Culture ID Panel (Reflexed)     Status: Abnormal   Collection Time: 09/06/20  3:20 PM  Result Value Ref Range Status   Enterococcus faecalis NOT DETECTED NOT DETECTED Final   Enterococcus Faecium NOT DETECTED NOT DETECTED Final   Listeria monocytogenes NOT DETECTED NOT DETECTED Final   Staphylococcus species DETECTED (A) NOT DETECTED Final    Comment: CRITICAL RESULT CALLED TO, READ BACK BY AND VERIFIED WITH: PHARMD M LILLISTON 09/09/20 AT 0033 SK     Staphylococcus aureus (BCID) NOT DETECTED NOT DETECTED Final   Staphylococcus epidermidis DETECTED (A) NOT DETECTED Final    Comment: Methicillin (oxacillin) resistant coagulase negative staphylococcus. Possible blood culture contaminant (unless isolated from more than one blood culture draw or  clinical case suggests pathogenicity). No antibiotic treatment is indicated for blood  culture contaminants. CRITICAL RESULT CALLED TO, READ BACK BY AND VERIFIED WITH: PHARMD M LILLISTON 09/09/20 AT 0033 SK     Staphylococcus lugdunensis NOT DETECTED NOT DETECTED Final   Streptococcus species NOT DETECTED NOT DETECTED Final   Streptococcus agalactiae NOT DETECTED NOT DETECTED Final   Streptococcus pneumoniae NOT DETECTED NOT DETECTED Final   Streptococcus pyogenes NOT DETECTED NOT DETECTED Final   A.calcoaceticus-baumannii NOT DETECTED NOT DETECTED Final   Bacteroides fragilis NOT DETECTED NOT DETECTED Final   Enterobacterales NOT DETECTED NOT DETECTED Final   Enterobacter cloacae complex NOT DETECTED NOT DETECTED Final   Escherichia coli NOT DETECTED NOT DETECTED Final   Klebsiella aerogenes NOT DETECTED NOT DETECTED Final    Klebsiella oxytoca NOT DETECTED NOT DETECTED Final   Klebsiella pneumoniae NOT DETECTED NOT DETECTED Final   Proteus species NOT DETECTED NOT DETECTED Final   Salmonella species NOT DETECTED NOT DETECTED Final   Serratia marcescens NOT DETECTED NOT DETECTED Final   Haemophilus influenzae NOT DETECTED NOT DETECTED Final   Neisseria meningitidis NOT DETECTED NOT DETECTED Final   Pseudomonas aeruginosa NOT DETECTED NOT DETECTED Final   Stenotrophomonas maltophilia NOT DETECTED NOT DETECTED Final   Candida albicans NOT DETECTED NOT DETECTED Final   Candida auris NOT DETECTED NOT DETECTED Final   Candida glabrata NOT DETECTED NOT DETECTED Final   Candida krusei NOT DETECTED NOT DETECTED Final   Candida parapsilosis NOT DETECTED NOT DETECTED Final   Candida tropicalis NOT DETECTED NOT DETECTED Final   Cryptococcus neoformans/gattii NOT DETECTED NOT DETECTED Final   Methicillin resistance mecA/C DETECTED (A) NOT DETECTED Final    Comment: CRITICAL RESULT CALLED TO, READ BACK BY AND VERIFIED WITH: PHARMD Sheffield Slider 09/09/20 AT 0033 SK  Performed at [facility], [address] [place], [place] [address]   Resp Panel by RT-PCR (Flu A&B, Covid) Nasopharyngeal Swab     Status: None   Collection Time: 09/06/20  4:22 PM   Specimen: Nasopharyngeal Swab; Nasopharyngeal(NP) swabs in vial transport medium  Result Value Ref Range Status   SARS Coronavirus 2 by RT PCR NEGATIVE NEGATIVE Final    Comment: (NOTE) SARS-CoV-2 target nucleic acids are NOT DETECTED.  The SARS-CoV-2 RNA is generally detectable in upper respiratory specimens during the acute phase of infection. The lowest concentration of SARS-CoV-2 viral copies this assay can detect is 138 copies/mL. A negative result does not preclude SARS-Cov-2 infection and should not be used as the sole basis for treatment or other patient management decisions. A negative result may occur with  improper specimen collection/handling, submission of  specimen other than nasopharyngeal swab, presence of viral mutation(s) within the areas targeted by this assay, and inadequate number of viral copies(<138 copies/mL). A negative result must be combined with clinical observations, patient history, and epidemiological information. The expected result is Negative.  Fact Sheet for Patients:  EntrepreneurPulse.com.au  Fact Sheet for Healthcare Providers:  IncredibleEmployment.be  This test is no t yet approved or cleared by the [place] FDA and  has been authorized for detection and/or diagnosis of SARS-CoV-2 by FDA under an Emergency Use Authorization (EUA). This EUA will remain  in effect (meaning this test can be used) for the duration of the COVID-19 declaration under Section 564(b)(1) of the Act, 21 U.S.C.section 360bbb-3(b)(1), unless the authorization is terminated  or revoked sooner.       Influenza A by PCR NEGATIVE NEGATIVE Final   Influenza B  by PCR NEGATIVE NEGATIVE Final    Comment: (NOTE) The Xpert Xpress SARS-CoV-2/FLU/RSV plus assay is intended as an aid in the diagnosis of influenza from Nasopharyngeal swab specimens and should not be used as a sole basis for treatment. Nasal washings and aspirates are unacceptable for Xpert Xpress SARS-CoV-2/FLU/RSV testing.  Fact Sheet for Patients: EntrepreneurPulse.com.au  Fact Sheet for Healthcare Providers: IncredibleEmployment.be  This test is not yet approved or cleared by the [place] FDA and has been authorized for detection and/or diagnosis of SARS-CoV-2 by FDA under an Emergency Use Authorization (EUA). This EUA will remain in effect (meaning this test can be used) for the duration of the COVID-19 declaration under Section 564(b)(1) of the Act, 21 U.S.C. section 360bbb-3(b)(1), unless the authorization is terminated or revoked.  Performed at [facility], [place]  [address] [place], [place] [address]   Culture, Urine     Status: None (Preliminary result)   Collection Time: 09/08/20  5:00 PM   Specimen: Urine, Random  Result Value Ref Range Status   Specimen Description   Final    URINE, RANDOM Performed at [facility], [place] [address] [place], [place] [address]    Special Requests   Final    NONE Performed at [facility], [place] [address] [place], [place] [address]    Culture   Final    CULTURE REINCUBATED FOR BETTER GROWTH 10,000 COLONIES/mL GRAM NEGATIVE RODS    Report Status PENDING  Incomplete         Radiology Studies: No results found.      Scheduled Meds: . glycopyrrolate  0.2 mg Intravenous TID  . haloperidol lactate  2 mg Intravenous Q4H  .  HYDROmorphone (DILAUDID) injection  0.5 mg Intravenous Q4H   Continuous Infusions:   LOS: 3 days   Time spent: 53mins Greater than 50% of this time was spent in counseling, explanation of diagnosis, planning of further management, and coordination of care.  I have personally reviewed and interpreted on  09/09/2020 daily labs, tele strips, I reviewed all nursing notes, pharmacy notes,  vitals, pertinent old records  I have discussed plan of care as described above with RN , patient and neice on 09/09/2020  Voice Recognition /Dragon dictation system was used to create this note, attempts have been made to correct errors. Please contact the author with questions and/or clarifications.   Florencia Reasons, MD PhD FACP [facility]  Available via Epic secure chat 7am-7pm for nonurgent issues Please page for urgent issues To page the attending provider between 7A-7P or the covering provider during after hours 7P-7A, please log into the web site [email or web address] and access using universal [place] password for that web site. If you do not have the password, please call the hospital operator.    09/09/2020, 3:05 PM

## 2020-09-10 DIAGNOSIS — R531 Weakness: Secondary | ICD-10-CM

## 2020-09-10 LAB — CULTURE, BLOOD (SINGLE): Special Requests: ADEQUATE

## 2020-09-10 LAB — GLUCOSE, CAPILLARY: Glucose-Capillary: 151 mg/dL — ABNORMAL HIGH (ref 70–99)

## 2020-09-10 MED ORDER — SCOPOLAMINE 1 MG/3DAYS TD PT72
1.0000 | MEDICATED_PATCH | TRANSDERMAL | Status: DC
  Administered 2020-09-10: 14:00:00 1.5 mg via TRANSDERMAL
  Filled 2020-09-10: qty 1

## 2020-09-10 NOTE — Plan of Care (Signed)
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
## 2020-09-10 NOTE — Progress Notes (Signed)
Manufacturing engineer [facility]) Hospital Liaison note.    [facility] is unable to offer a room today. Hospital Liaison will follow up tomorrow or sooner if a room becomes available. Please do not hesitate to call with questions.    Thank you for the opportunity to participate in this patient's care.  Chrislyn Edison Pace, BSN, RN [place] (listed on [place] under Hospice[facility])    [phone]

## 2020-09-10 NOTE — Progress Notes (Signed)
Daily Progress Note   Patient Name: Corey West       Date: 09/10/2020 DOB: 1951/03/07  Age: 69 y.o. MRN#: [id number] Attending Physician: Florencia Reasons, MD Primary Care Physician: Leonel Ramsay, MD Admit Date: 09/06/2020  Reason for Consultation/Follow-up: Terminal Care  Subjective:  resting comfortably, arouses some, no non verbal gestures of distress or discomfort. Medication history noted. Has coarse breath sounds audible from outside the room.   Length of Stay: 4  Current Medications: Scheduled Meds:  . glycopyrrolate  0.2 mg Intravenous TID  . haloperidol lactate  2 mg Intravenous Q4H  .  HYDROmorphone (DILAUDID) injection  0.5 mg Intravenous Q4H    Continuous Infusions:   PRN Meds: albuterol, antiseptic oral rinse, glycopyrrolate, guaiFENesin-dextromethorphan, HYDROmorphone (DILAUDID) injection, LORazepam, ondansetron **OR** ondansetron (ZOFRAN) IV, polyvinyl alcohol  Physical Exam         Awakens some, no distress Has coarse loud breath sounds Diminished heart sounds Some edema Not alert  Vital Signs: BP 113/78 (BP Location: Left Arm)   Pulse (!) 123   Temp (!) 97.5 F (36.4 C) (Oral)   Resp 16   Ht 5' 10.8" (1.798 m)   Wt 111 kg   SpO2 100%   BMI 34.32 kg/m  SpO2: SpO2: 100 % O2 Device: O2 Device: Room Air O2 Flow Rate:    Intake/output summary:   Intake/Output Summary (Last 24 hours) at 09/10/2020 1019 Last data filed at 09/09/2020 1600 Gross per 24 hour  Intake --  Output 350 ml  Net -350 ml   LBM: Last BM Date: 09/08/20 Baseline Weight: Weight: (!) 140 kg Most recent weight: Weight: 111 kg       Palliative Assessment/Data:      Patient Active Problem List   Diagnosis Date Noted  . Acute metabolic encephalopathy 30/16/0109  .  Pneumonia due to COVID-19 virus 08/14/2020  . GI bleed 07/26/2020  . Acute on chronic anemia 07/25/2020  . Generalized weakness 07/25/2020  . SIRS (systemic inflammatory response syndrome) ([place]) 07/07/2020  . Anemia   . Hypothermia   . Anemia of chronic disease 06/07/2020  . Prolonged QT interval 06/07/2020  . Hypoglycemia 06/07/2020  . Ascites 06/07/2020  . Portal hypertensive gastropathy ([place])   . Symptomatic anemia 05/19/2020  . Acute blood loss anemia 05/19/2020  . Acute GI bleeding  05/19/2020  . Bacteremia due to Klebsiella pneumoniae 04/26/2020  . Palliative care by specialist   . DNR (do not resuscitate) discussion   . COPD with chronic bronchitis ([place]) 04/23/2020  . Paroxysmal atrial fibrillation ([place]) 04/20/2020  . Cellulitis of left leg 02/23/2020  . Contusion of ribs, left, initial encounter 02/23/2020  . Acute hypoxemic respiratory failure ([place]) 02/04/2020  . Volume overload 01/28/2020  . Hyperlipidemia associated with type 2 diabetes mellitus ([place]) 01/28/2020  . Liver cirrhosis ([place])   . Anxiety   . SOB (shortness of breath)   . Elevated troponin   . Chronic kidney disease, stage 4 (severe) ([place])   . Unstageable pressure ulcer of sacral region ([place]) 11/28/2019  . Chronic diastolic CHF (congestive heart failure) ([place]) 11/27/2019  . Type 2 diabetes mellitus with stage 3 chronic kidney disease ([place]) 11/27/2019  . Cellulitis of left lower extremity 11/27/2019  . Cellulitis of lower extremity 11/27/2019  . Cyst of pancreas 05/18/2019  . HCAP (healthcare-associated pneumonia) 02/03/2019  . Aphasia 01/14/2019  . Acute on chronic renal failure ([place]) 01/14/2019  . Malnutrition of moderate degree 12/03/2018  . C. difficile diarrhea 12/02/2018  . Ankle fracture, left, closed, initial encounter 10/15/2018  . Ankle fracture, right, closed, initial encounter 10/15/2018  . Ankle fracture 10/15/2018  . Pressure injury of skin 07/19/2018  . UTI (urinary tract infection)  07/18/2018  . Acute kidney injury superimposed on CKD ([place]) 01/02/2018  . 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 Goals of care, counseling/discussion   . Palliative care encounter   . Acute hepatic encephalopathy 12/16/2017  . Hepatic encephalopathy ([place]) 10/18/2017  . Hypokalemia 10/09/2017  . Hyperglycemia 10/09/2017  . Sepsis ([place]) 06/12/2017  . Type 2 diabetes mellitus with diabetic nephropathy, with long-term current use of insulin ([place]) 05/29/2017  . Long term (current) use of insulin ([place]) 05/29/2017  . Cellulitis in diabetic foot ([place]) 02/21/2017  . Cellulitis 02/21/2017  . Upper GI bleed/melena 07/20/2015  . Hematemesis 02/08/2015  . Cirrhosis ([place]) 02/08/2015  . Esophageal varices ([place]) 02/08/2015  . Gastroesophageal reflux disease 02/08/2015  . Hyperlipidemia 02/08/2015  . Chronic venous stasis dermatitis of both lower extremities 10/06/2014  . Cutaneous abscess of limb, unspecified 09/17/2014  . Edema 05/25/2014  . Hypertension 05/25/2014  . Obesity, Class III, BMI 40-49.9 (morbid obesity) ([place]) 05/25/2014  . Sleep apnea 05/25/2014  . Barrett's esophagus 05/25/2014  . Pancytopenia ([place]) 05/25/2014  . History of esophageal varices 05/25/2014  . Iron deficiency anemia 12/04/2013  . Thrombocytopenia ([place]) 12/04/2013  . Splenomegaly 11/04/2013    Palliative Care Assessment & Plan   Patient Profile:    Assessment: 69 y.o.malewith past medical history of cirrhosis of the liver, portal hypertensive gastropathy, hepatic encephalopathy, chronic diastolic CHF, type II DM, CKDstage 4, HTN, chronic anemia, anxiety, BPHadmitted on 12/7/2021with confusion and agitation with hepatic encephalopathy and nondisplaced right olecranon fracture.Ammonia down to 32 from 174.Full comfort care decided 09/08/20.   Recommendations/Plan:  Continue current scope of comfort measures, patient on Haldol Ativan and also on opioids.   Will add scopolamine patch for  secretions.   Await hospice bed.   Goals of Care and Additional Recommendations:  Limitations on Scope of Treatment: Full Comfort Care  Code Status:    Code Status Orders  (From admission, onward)         Start     Ordered   09/08/20 1647  Do not attempt resuscitation (DNR)  Continuous       Question Answer Comment  In the event of cardiac or respiratory ARREST Do not call a "code blue"   In the event of cardiac or respiratory ARREST Do not perform Intubation, CPR, defibrillation or ACLS   In the event of cardiac or respiratory ARREST Use medication by any route, position, wound care, and other measures to relive pain and suffering. May use oxygen, suction and manual treatment of airway obstruction as needed for comfort.      09/08/20 1646        Code Status History    Date Active Date Inactive Code Status Order ID Comments User Context   09/08/2020 1615 09/08/2020 1646 DNR [id number]  Pershing Proud, NP Inpatient   09/06/2020 1852 09/08/2020 1615 Full Code [id number]  Lavina Hamman, MD ED   08/14/2020 0837 08/18/2020 1646 Full Code [id number]  Collier Bullock, MD ED   08/01/2020 1155 08/10/2020 1920 Full Code [id number]  Collier Bullock, MD ED   08/01/2020 1011 08/01/2020 1155 DNR [id number]  Collier Bullock, MD ED   07/25/2020 2103 07/30/2020 2119 Full Code [id number]  Howerter, Ethelda Chick, DO ED   07/07/2020 2054 07/10/2020 0121 Full Code [id number]  Ivor Costa, MD Inpatient   07/07/2020 0921 07/07/2020 2054 Partial Code [id number]  Ivor Costa, MD ED   06/07/2020 0257 06/11/2020 1852 Full Code [id number]  Chotiner, Yevonne Aline, MD ED   05/19/2020 0508 05/23/2020 1504 Full Code [id number]  Athena Masse, MD ED   04/23/2020 1936 05/03/2020 1829 Full Code [id number]  Athena Masse, MD ED   02/23/2020 1535 02/28/2020 0350 Full Code [id number]  Ezekiel Slocumb, DO ED   01/28/2020 2254 02/12/2020 1842 Full Code [id number]  Lenore Cordia, MD Inpatient   12/14/2019 1244 12/17/2019 1814 Full Code [id number]   Ivor Costa, MD ED   11/27/2019 1619 11/30/2019 1522 Full Code [id number]  Ivor Costa, MD ED   04/28/2019 2254 04/29/2019 1722 Full Code [id number]  Loletha Grayer, MD ED   03/05/2019 1753 03/06/2019 2020 Full Code [id number]  Dustin Flock, MD Inpatient   02/03/2019 0541 02/05/2019 1436 Full Code [id number]  Harrie Foreman, MD Inpatient   01/15/2019 0047 01/15/2019 1657 Full Code [id number]  Lance Coon, MD Inpatient   12/02/2018 1713 12/09/2018 1737 Full Code [id number]  Hillary Bow, MD ED   10/16/2018 0054 10/18/2018 1842 Full Code [id number]  [name], MD Inpatient   07/18/2018 2339 07/21/2018 1356 Full Code [id number]  Lance Coon, MD Inpatient   06/18/2018 1609 06/19/2018 1717 Full Code [id number]  Epifanio Lesches, MD ED   12/31/2017 1856 01/01/2018 1205 Full Code [id number]  Saundra Shelling, MD Inpatient   12/25/2017 1456 12/26/2017 1618 Full Code [id number]  Epifanio Lesches, MD ED   12/16/2017 0921 12/18/2017 1635 DNR [id number]  Max Sane, MD Inpatient   12/16/2017 0305 12/16/2017 0921 Full Code [id number]  Salary, Avel Peace, MD ED   12/02/2017 2003 12/05/2017 2000 Full Code [id number]  Saundra Shelling, MD Inpatient   11/21/2017 1948 11/23/2017 1958 Full Code [id number]  Loletha Grayer, MD ED   10/23/2017 1405 10/26/2017 1402 Full Code [id number]  Bettey Costa, MD Inpatient   10/18/2017 0058 10/19/2017 1806 Full Code [id number]  Saundra Shelling, MD Inpatient   10/09/2017 2139 10/11/2017 1806 Full Code [id number]  [name], MD Inpatient   09/03/2017 2124 09/05/2017 1552 Full Code [id number]  Nicholes Mango, MD Inpatient   06/26/2017 1119 06/28/2017 1748 Full Code [id number]  Henreitta Leber, MD Inpatient   02/21/2017 2007 02/22/2017 1803 Full Code [id number]  [name],  MD Inpatient   07/20/2015 1245 07/22/2015 2217 Full Code [id number]  Nicholes Mango, MD Inpatient   02/08/2015 2331 02/11/2015 1943 Full Code [id number]  Hower, Aaron Mose, MD ED   Advance Care Planning Activity        Prognosis:   < 2 weeks  Discharge Planning:  Hospice facility  Care plan was discussed with  IDT  Thank you for allowing the Palliative Medicine Team to assist in the care of this patient.   Time In: 9.30 Time Out: 9.55 Total Time 25 Prolonged Time Billed No       Greater than 50%  of this time was spent counseling and coordinating care related to the above assessment and plan.  Loistine Chance, MD  Please contact Palliative Medicine Team phone at [phone] for questions and concerns.

## 2020-09-10 NOTE — Progress Notes (Signed)
   09/10/20 2345  Complaints & Interventions  Complains of Agitation  Neuro symptoms relieved by Anti-anxiety medication   Pt making attempts to get out of bed. Pt is anxious and agitated.  Origin unclear.  Pt is hollering out from the room saying let him out.  Pt is also attempting to reach for the newly started IV.  Pt informed the reason why the IV needs to stay in place.  Attempted to reposition pt but pt is not wanting to be moved.  Left patient alone to prevent further agitation.  Pt is not able to be redirected at this time although multiple attempts have been tried. Medicated per md order.

## 2020-09-11 LAB — URINE CULTURE: Culture: 50000 — AB

## 2020-09-11 NOTE — Progress Notes (Signed)
Manufacturing engineer [facility]) Hospital Liaison note.    [facility] is unable to offer a room today. Hospital Liaison will follow up tomorrow or sooner if a room becomes available. Please do not hesitate to call with questions.    Thank you for the opportunity to participate in this patient's care.  Clementeen Hoof, BSN, RN [place] (listed on [place] under Hospice/[facility])    [phone]

## 2020-09-11 NOTE — Progress Notes (Signed)
Daily Progress Note   Patient Name: Corey West       Date: 09/11/2020 DOB: 03-Nov-1950  Age: 69 y.o. MRN#: [id number] Attending Physician: Florencia Reasons, MD Primary Care Physician: Leonel Ramsay, MD Admit Date: 09/06/2020  Reason for Consultation/Follow-up: Terminal Care  Subjective:  awake today, no distress, appears calm, answers a few questions appropriately, does not feel very hungry  Length of Stay: 5  Current Medications: Scheduled Meds:  . glycopyrrolate  0.2 mg Intravenous TID  . haloperidol lactate  2 mg Intravenous Q4H  .  HYDROmorphone (DILAUDID) injection  0.5 mg Intravenous Q4H  . scopolamine  1 patch Transdermal Q72H    Continuous Infusions:   PRN Meds: albuterol, antiseptic oral rinse, glycopyrrolate, guaiFENesin-dextromethorphan, HYDROmorphone (DILAUDID) injection, LORazepam, ondansetron **OR** ondansetron (ZOFRAN) IV, polyvinyl alcohol  Physical Exam         Awake, no distress Has coarse loud breath sounds Diminished heart sounds Some edema Not alert  Vital Signs: BP 132/70 (BP Location: Right Arm)   Pulse (!) 110   Temp 98.5 F (36.9 C) (Oral)   Resp 20   Ht 5' 10.8" (1.798 m)   Wt 111 kg   SpO2 100%   BMI 34.32 kg/m  SpO2: SpO2: 100 % O2 Device: O2 Device: Room Air O2 Flow Rate:    Intake/output summary:   Intake/Output Summary (Last 24 hours) at 09/11/2020 0951 Last data filed at 09/11/2020 0524 Gross per 24 hour  Intake 0 ml  Output 880 ml  Net -880 ml   LBM: Last BM Date: 09/08/20 Baseline Weight: Weight: (!) 140 kg Most recent weight: Weight: 111 kg       Palliative Assessment/Data:      Patient Active Problem List   Diagnosis Date Noted  . Acute metabolic encephalopathy 33/83/2919  . Pneumonia due to COVID-19 virus  08/14/2020  . GI bleed 07/26/2020  . Acute on chronic anemia 07/25/2020  . Generalized weakness 07/25/2020  . SIRS (systemic inflammatory response syndrome) ([place]) 07/07/2020  . Anemia   . Hypothermia   . Anemia of chronic disease 06/07/2020  . Prolonged QT interval 06/07/2020  . Hypoglycemia 06/07/2020  . Ascites 06/07/2020  . Portal hypertensive gastropathy ([place])   . Symptomatic anemia 05/19/2020  . Acute blood loss anemia 05/19/2020  . Acute GI bleeding 05/19/2020  .  Bacteremia due to Klebsiella pneumoniae 04/26/2020  . Palliative care by specialist   . DNR (do not resuscitate) discussion   . COPD with chronic bronchitis ([place]) 04/23/2020  . Paroxysmal atrial fibrillation ([place]) 04/20/2020  . Cellulitis of left leg 02/23/2020  . Contusion of ribs, left, initial encounter 02/23/2020  . Acute hypoxemic respiratory failure ([place]) 02/04/2020  . Volume overload 01/28/2020  . Hyperlipidemia associated with type 2 diabetes mellitus ([place]) 01/28/2020  . Liver cirrhosis ([place])   . Anxiety   . SOB (shortness of breath)   . Elevated troponin   . Chronic kidney disease, stage 4 (severe) ([place])   . Unstageable pressure ulcer of sacral region ([place]) 11/28/2019  . Chronic diastolic CHF (congestive heart failure) ([place]) 11/27/2019  . Type 2 diabetes mellitus with stage 3 chronic kidney disease ([place]) 11/27/2019  . Cellulitis of left lower extremity 11/27/2019  . Cellulitis of lower extremity 11/27/2019  . Cyst of pancreas 05/18/2019  . HCAP (healthcare-associated pneumonia) 02/03/2019  . Aphasia 01/14/2019  . Acute on chronic renal failure ([place]) 01/14/2019  . Malnutrition of moderate degree 12/03/2018  . C. difficile diarrhea 12/02/2018  . Ankle fracture, left, closed, initial encounter 10/15/2018  . Ankle fracture, right, closed, initial encounter 10/15/2018  . Ankle fracture 10/15/2018  . Pressure injury of skin 07/19/2018  . UTI (urinary tract infection) 07/18/2018  . Acute kidney injury  superimposed on CKD ([place]) 01/02/2018  . 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 Goals of care, counseling/discussion   . Palliative care encounter   . Acute hepatic encephalopathy 12/16/2017  . Hepatic encephalopathy ([place]) 10/18/2017  . Hypokalemia 10/09/2017  . Hyperglycemia 10/09/2017  . Sepsis ([place]) 06/12/2017  . Type 2 diabetes mellitus with diabetic nephropathy, with long-term current use of insulin ([place]) 05/29/2017  . Long term (current) use of insulin ([place]) 05/29/2017  . Cellulitis in diabetic foot ([place]) 02/21/2017  . Cellulitis 02/21/2017  . Upper GI bleed/melena 07/20/2015  . Hematemesis 02/08/2015  . Cirrhosis ([place]) 02/08/2015  . Esophageal varices ([place]) 02/08/2015  . Gastroesophageal reflux disease 02/08/2015  . Hyperlipidemia 02/08/2015  . Chronic venous stasis dermatitis of both lower extremities 10/06/2014  . Cutaneous abscess of limb, unspecified 09/17/2014  . Edema 05/25/2014  . Hypertension 05/25/2014  . Obesity, Class III, BMI 40-49.9 (morbid obesity) ([place]) 05/25/2014  . Sleep apnea 05/25/2014  . Barrett's esophagus 05/25/2014  . Pancytopenia ([place]) 05/25/2014  . History of esophageal varices 05/25/2014  . Iron deficiency anemia 12/04/2013  . Thrombocytopenia ([place]) 12/04/2013  . Splenomegaly 11/04/2013    Palliative Care Assessment & Plan   Patient Profile:    Assessment: 69 y.o.malewith past medical history of cirrhosis of the liver, portal hypertensive gastropathy, hepatic encephalopathy, chronic diastolic CHF, type II DM, CKDstage 4, HTN, chronic anemia, anxiety, BPHadmitted on 12/7/2021with confusion and agitation with hepatic encephalopathy and nondisplaced right olecranon fracture.Ammonia down to 32 from 174.Full comfort care decided 09/08/20.   Recommendations/Plan:  Continue current scope of comfort measures, patient on Haldol Ativan and also on opioids.    also on scopolamine patch for secretions.   Await hospice bed.    Goals of Care and Additional Recommendations:  Limitations on Scope of Treatment: Full Comfort Care  Code Status:    Code Status Orders  (From admission, onward)         Start     Ordered   09/08/20 1647  Do not attempt resuscitation (DNR)  Continuous       Question Answer Comment  In  the event of cardiac or respiratory ARREST Do not call a "code blue"   In the event of cardiac or respiratory ARREST Do not perform Intubation, CPR, defibrillation or ACLS   In the event of cardiac or respiratory ARREST Use medication by any route, position, wound care, and other measures to relive pain and suffering. May use oxygen, suction and manual treatment of airway obstruction as needed for comfort.      09/08/20 1646        Code Status History    Date Active Date Inactive Code Status Order ID Comments User Context   09/08/2020 1615 09/08/2020 1646 DNR [id number]  Pershing Proud, NP Inpatient   09/06/2020 1852 09/08/2020 1615 Full Code [id number]  Lavina Hamman, MD ED   08/14/2020 0837 08/18/2020 1646 Full Code [id number]  Collier Bullock, MD ED   08/01/2020 1155 08/10/2020 1920 Full Code [id number]  Collier Bullock, MD ED   08/01/2020 1011 08/01/2020 1155 DNR [id number]  Collier Bullock, MD ED   07/25/2020 2103 07/30/2020 2119 Full Code [id number]  Howerter, Ethelda Chick, DO ED   07/07/2020 2054 07/10/2020 0121 Full Code [id number]  Ivor Costa, MD Inpatient   07/07/2020 0921 07/07/2020 2054 Partial Code [id number]  Ivor Costa, MD ED   06/07/2020 0257 06/11/2020 1852 Full Code [id number]  Chotiner, Yevonne Aline, MD ED   05/19/2020 0508 05/23/2020 1504 Full Code [id number]  Athena Masse, MD ED   04/23/2020 1936 05/03/2020 1829 Full Code [id number]  Athena Masse, MD ED   02/23/2020 1535 02/28/2020 0350 Full Code [id number]  Ezekiel Slocumb, DO ED   01/28/2020 2254 02/12/2020 1842 Full Code [id number]  Lenore Cordia, MD Inpatient   12/14/2019 1244 12/17/2019 1814 Full Code [id number]  Ivor Costa, MD ED   11/27/2019  1619 11/30/2019 1522 Full Code [id number]  Ivor Costa, MD ED   04/28/2019 2254 04/29/2019 1722 Full Code [id number]  Loletha Grayer, MD ED   03/05/2019 1753 03/06/2019 2020 Full Code [id number]  Dustin Flock, MD Inpatient   02/03/2019 0541 02/05/2019 1436 Full Code [id number]  Harrie Foreman, MD Inpatient   01/15/2019 0047 01/15/2019 1657 Full Code [id number]  Lance Coon, MD Inpatient   12/02/2018 1713 12/09/2018 1737 Full Code [id number]  Hillary Bow, MD ED   10/16/2018 0054 10/18/2018 1842 Full Code [id number]  [name], MD Inpatient   07/18/2018 2339 07/21/2018 1356 Full Code [id number]  Lance Coon, MD Inpatient   06/18/2018 1609 06/19/2018 1717 Full Code [id number]  Epifanio Lesches, MD ED   12/31/2017 1856 01/01/2018 1205 Full Code [id number]  Saundra Shelling, MD Inpatient   12/25/2017 1456 12/26/2017 1618 Full Code [id number]  Epifanio Lesches, MD ED   12/16/2017 0921 12/18/2017 1635 DNR [id number]  Max Sane, MD Inpatient   12/16/2017 0305 12/16/2017 0921 Full Code [id number]  Salary, Avel Peace, MD ED   12/02/2017 2003 12/05/2017 2000 Full Code [id number]  Saundra Shelling, MD Inpatient   11/21/2017 1948 11/23/2017 1958 Full Code [id number]  Loletha Grayer, MD ED   10/23/2017 1405 10/26/2017 1402 Full Code [id number]  Bettey Costa, MD Inpatient   10/18/2017 0058 10/19/2017 1806 Full Code [id number]  Saundra Shelling, MD Inpatient   10/09/2017 2139 10/11/2017 1806 Full Code [id number]  [name], MD Inpatient   09/03/2017 2124 09/05/2017 1552 Full Code [id number]  Nicholes Mango, MD Inpatient   06/26/2017 1119 06/28/2017 1748 Full Code [id number]  Henreitta Leber, MD Inpatient   02/21/2017 2007 02/22/2017 1803 Full Code [id number]  [name], MD  Inpatient   07/20/2015 1245 07/22/2015 2217 Full Code [id number]  Nicholes Mango, MD Inpatient   02/08/2015 2331 02/11/2015 1943 Full Code [id number]  Hower, Aaron Mose, MD ED   Advance Care Planning Activity       Prognosis:   < 2 weeks  Discharge  Planning:  Hospice facility  Care plan was discussed with  IDT Call placed and discussed with patient's niece [name], family is planning to visit today, awaiting to hear whether or not there is a bed for patient at hospice facility today.  Continue current scope of comfort measures.  Thank you for allowing the Palliative Medicine Team to assist in the care of this patient.   Time In: 9 Time Out: 9.25 Total Time 25 Prolonged Time Billed No       Greater than 50%  of this time was spent counseling and coordinating care related to the above assessment and plan.  Loistine Chance, MD  Please contact Palliative Medicine Team phone at [phone] for questions and concerns.

## 2020-09-11 NOTE — Progress Notes (Addendum)
Patient is on full comfort measures, awaiting for hospice bed, discharge order and discharge summary was done on December 11  He currently appears comfortable, interactive, remains confused, palliative care input appreciated updated nephew at bedside.

## 2020-09-12 NOTE — Progress Notes (Signed)
Patient seen and examined at the bedside this morning.  Hemodynamically stable and overall comfortable during my evaluation.  He was sitting at the edge of the bed during my evaluation.  Was not in any kind of distress.  He was confused. There is no change in the medical management today.  He is waiting for bed in the residential hospice.  Discharge orders and summary have already been in place.

## 2020-09-12 NOTE — TOC Progression Note (Signed)
Transition of Care [facility]) - Progression Note    Patient Details  Name: Corey West MRN: [id number] Date of Birth: 1950/11/11  Transition of Care [facility]) CM/SW Contact  Purcell Mouton, RN Phone Number: 09/12/2020, 3:24 PM  Clinical Narrative:     Pt was placed on Will Call with PTAR. RN please call PTAR number on packet when pt is ready. Hospice RN will call or secure chat when paperwork is signed.   Expected Discharge Plan: [place] Barriers to Discharge: No Barriers Identified  Expected Discharge Plan and Services Expected Discharge Plan: [place]   Discharge Planning Services: CM Consult   Living arrangements for the past 2 months: [place] Expected Discharge Date: 09/12/20                                     Social Determinants of Health (SDOH) Interventions    Readmission Risk Interventions Readmission Risk Prevention Plan 08/16/2020 07/27/2020 06/09/2020  Transportation Screening Complete Complete Complete  PCP or Specialist Appt within 3-5 Days - - -  HRI or [place] Work Consult for [place] - - -  Medication Review Press photographer) Complete Complete Complete  PCP or Specialist appointment within 3-5 days of discharge Complete Complete Complete  HRI or Home Care Consult Complete Complete Complete  SW Recovery Care/Counseling Consult Complete Complete Complete  SW Consult Not Complete Comments - - -  Palliative Care Screening Complete Not Applicable Not Applicable  Skilled Nursing Facility Complete Complete Not Applicable  Some recent data might be hidden

## 2020-09-12 NOTE — Progress Notes (Signed)
[facility])   Necessary consents are completed and transportation can be arranged.  Updated TOC to request at 5 pm transfer, if possible.  Thank you, Venia Carbon RN, BSN, [place] [facility] Liaison

## 2020-09-12 NOTE — Progress Notes (Signed)
Manufacturing engineer [facility]) Hospital Liaison note.    [facility] has offered [name] a bed for today. Spoke with his niece Corey West to confirm interest and explain services. Family agreeable to transfer today. TOC aware.    [facility] will notify TOC when registration paperwork has been completed to arrange transport.   RN please call report to [phone]. Please be sure the the [facility] DNR form transports with the patient.  Thank you for the opportunity to participate in this patient's care.  Corey West, BSN, RN [place] (listed on [place] under [facility])    [phone]

## 2020-09-12 NOTE — Progress Notes (Signed)
Report called to [facility] in [place]. Awaiting family to complete paper work and PTAR to transport. Will continue to monitor.

## 2020-09-12 NOTE — Care Management Important Message (Signed)
Important Message  Patient Details IM Letter given to the Patient. Name: Corey West MRN: [id number] Date of Birth: 01/28/1951   Medicare Important Message Given:  Yes     Kerin Salen 09/12/2020, 12:38 PM

## 2020-09-12 NOTE — Progress Notes (Signed)
Palliative:  Corey West is sleeping comfortably. No family at bedside. Awaiting transfer to hospice facility planned for today.   No charge  Vinie Sill, NP Palliative Medicine Team Pager [phone] (Please see [email or web address] for schedule) Team Phone [phone]

## 2020-09-12 NOTE — TOC Progression Note (Signed)
Transition of Care [facility]) - Progression Note    Patient Details  Name: Corey West MRN: [id number] Date of Birth: 09-07-1951  [facility] CM/SW Contact  Purcell Mouton, RN Phone Number: 09/12/2020, 2:44 PM  Clinical Narrative:     Spoke with pt's niece Corey West who states that she has not received paperwork from Hospice.   Expected Discharge Plan: [place] Barriers to Discharge: No Barriers Identified  Expected Discharge Plan and Services Expected Discharge Plan: [place]   Discharge Planning Services: CM Consult   Living arrangements for the past 2 months: [place] Expected Discharge Date: 09/12/20                                     Social Determinants of Health (SDOH) Interventions    Readmission Risk Interventions Readmission Risk Prevention Plan 08/16/2020 07/27/2020 06/09/2020  Transportation Screening Complete Complete Complete  PCP or Specialist Appt within 3-5 Days - - -  HRI or [place] Work Consult for [place] - - -  Medication Review Press photographer) Complete Complete Complete  PCP or Specialist appointment within 3-5 days of discharge Complete Complete Complete  HRI or Home Care Consult Complete Complete Complete  SW Recovery Care/Counseling Consult Complete Complete Complete  SW Consult Not Complete Comments - - -  Palliative Care Screening Complete Not Applicable Not Applicable  Skilled Nursing Facility Complete Complete Not Applicable  Some recent data might be hidden
